# Patient Record
Sex: Female | Born: 1950
Health system: Southern US, Community
[De-identification: ages and names within clinical notes are randomized; demographics above are authoritative.]

## PROBLEM LIST (undated history)

## (undated) DIAGNOSIS — I509 Heart failure, unspecified: Secondary | ICD-10-CM

## (undated) DIAGNOSIS — Z973 Presence of spectacles and contact lenses: Secondary | ICD-10-CM

## (undated) DIAGNOSIS — J45909 Unspecified asthma, uncomplicated: Secondary | ICD-10-CM

## (undated) DIAGNOSIS — E785 Hyperlipidemia, unspecified: Secondary | ICD-10-CM

## (undated) DIAGNOSIS — D649 Anemia, unspecified: Secondary | ICD-10-CM

## (undated) DIAGNOSIS — IMO0002 Reserved for concepts with insufficient information to code with codable children: Secondary | ICD-10-CM

## (undated) DIAGNOSIS — G5600 Carpal tunnel syndrome, unspecified upper limb: Secondary | ICD-10-CM

## (undated) DIAGNOSIS — L03116 Cellulitis of left lower limb: Secondary | ICD-10-CM

## (undated) DIAGNOSIS — L97909 Non-pressure chronic ulcer of unspecified part of unspecified lower leg with unspecified severity: Secondary | ICD-10-CM

## (undated) DIAGNOSIS — Z87442 Personal history of urinary calculi: Secondary | ICD-10-CM

## (undated) DIAGNOSIS — L509 Urticaria, unspecified: Secondary | ICD-10-CM

## (undated) DIAGNOSIS — G629 Polyneuropathy, unspecified: Secondary | ICD-10-CM

## (undated) DIAGNOSIS — E538 Deficiency of other specified B group vitamins: Secondary | ICD-10-CM

## (undated) DIAGNOSIS — G609 Hereditary and idiopathic neuropathy, unspecified: Secondary | ICD-10-CM

## (undated) DIAGNOSIS — L309 Dermatitis, unspecified: Secondary | ICD-10-CM

## (undated) DIAGNOSIS — K635 Polyp of colon: Secondary | ICD-10-CM

## (undated) DIAGNOSIS — K219 Gastro-esophageal reflux disease without esophagitis: Secondary | ICD-10-CM

## (undated) DIAGNOSIS — G4730 Sleep apnea, unspecified: Secondary | ICD-10-CM

## (undated) DIAGNOSIS — E119 Type 2 diabetes mellitus without complications: Secondary | ICD-10-CM

## (undated) DIAGNOSIS — L0390 Cellulitis, unspecified: Secondary | ICD-10-CM

## (undated) DIAGNOSIS — I2510 Atherosclerotic heart disease of native coronary artery without angina pectoris: Secondary | ICD-10-CM

## (undated) DIAGNOSIS — N189 Chronic kidney disease, unspecified: Secondary | ICD-10-CM

## (undated) DIAGNOSIS — K279 Peptic ulcer, site unspecified, unspecified as acute or chronic, without hemorrhage or perforation: Secondary | ICD-10-CM

## (undated) DIAGNOSIS — G5710 Meralgia paresthetica, unspecified lower limb: Secondary | ICD-10-CM

## (undated) DIAGNOSIS — T4145XA Adverse effect of unspecified anesthetic, initial encounter: Secondary | ICD-10-CM

## (undated) DIAGNOSIS — I10 Essential (primary) hypertension: Secondary | ICD-10-CM

## (undated) DIAGNOSIS — L03115 Cellulitis of right lower limb: Secondary | ICD-10-CM

## (undated) DIAGNOSIS — M1710 Unilateral primary osteoarthritis, unspecified knee: Secondary | ICD-10-CM

## (undated) DIAGNOSIS — IMO0001 Reserved for inherently not codable concepts without codable children: Secondary | ICD-10-CM

## (undated) HISTORY — DX: Carpal tunnel syndrome, unspecified upper limb: G56.00

## (undated) HISTORY — PX: DILATION AND CURETTAGE OF UTERUS: SHX78

## (undated) HISTORY — DX: Polyp of colon: K63.5

## (undated) HISTORY — DX: Meralgia paresthetica, unspecified lower limb: G57.10

## (undated) HISTORY — DX: Hereditary and idiopathic neuropathy, unspecified: G60.9

## (undated) HISTORY — DX: Reserved for concepts with insufficient information to code with codable children: IMO0002

## (undated) HISTORY — PX: HEMORRHOID SURGERY: SHX153

## (undated) HISTORY — DX: Anemia, unspecified: D64.9

## (undated) HISTORY — DX: Hyperlipidemia, unspecified: E78.5

## (undated) HISTORY — DX: Atherosclerotic heart disease of native coronary artery without angina pectoris: I25.10

## (undated) HISTORY — DX: Urticaria, unspecified: L50.9

## (undated) HISTORY — DX: Unilateral primary osteoarthritis, unspecified knee: M17.10

## (undated) HISTORY — DX: Essential (primary) hypertension: I10

## (undated) HISTORY — DX: Unspecified asthma, uncomplicated: J45.909

## (undated) HISTORY — DX: Morbid (severe) obesity due to excess calories: E66.01

## (undated) HISTORY — PX: ABDOMINAL HYSTERECTOMY: SHX81

## (undated) HISTORY — DX: Type 2 diabetes mellitus without complications: E11.9

## (undated) HISTORY — PX: HEMORROIDECTOMY: SUR656

## (undated) HISTORY — DX: Non-pressure chronic ulcer of unspecified part of unspecified lower leg with unspecified severity: L97.909

## (undated) HISTORY — PX: OTHER SURGICAL HISTORY: SHX169

## (undated) HISTORY — DX: Peptic ulcer, site unspecified, unspecified as acute or chronic, without hemorrhage or perforation: K27.9

## (undated) HISTORY — PX: TONSILLECTOMY AND ADENOIDECTOMY: SUR1326

## (undated) HISTORY — DX: Cellulitis of right lower limb: L03.115

## (undated) HISTORY — DX: Cellulitis of left lower limb: L03.116

---

## 1997-02-02 DIAGNOSIS — Z87442 Personal history of urinary calculi: Secondary | ICD-10-CM

## 1997-02-02 DIAGNOSIS — T8859XA Other complications of anesthesia, initial encounter: Secondary | ICD-10-CM

## 1997-02-02 HISTORY — DX: Personal history of urinary calculi: Z87.442

## 1997-02-02 HISTORY — DX: Other complications of anesthesia, initial encounter: T88.59XA

## 1997-05-11 ENCOUNTER — Encounter: Admission: RE | Admit: 1997-05-11 | Discharge: 1997-08-09 | Payer: Self-pay | Admitting: Internal Medicine

## 1997-11-01 ENCOUNTER — Encounter: Admission: RE | Admit: 1997-11-01 | Discharge: 1998-01-30 | Payer: Self-pay | Admitting: Internal Medicine

## 1997-12-03 HISTORY — PX: OTHER SURGICAL HISTORY: SHX169

## 1998-01-02 ENCOUNTER — Encounter: Payer: Self-pay | Admitting: Urology

## 1998-01-03 ENCOUNTER — Observation Stay (HOSPITAL_COMMUNITY): Admission: RE | Admit: 1998-01-03 | Discharge: 1998-01-04 | Payer: Self-pay | Admitting: Urology

## 1998-01-03 ENCOUNTER — Encounter: Payer: Self-pay | Admitting: Urology

## 1998-08-20 ENCOUNTER — Inpatient Hospital Stay (HOSPITAL_COMMUNITY): Admission: EM | Admit: 1998-08-20 | Discharge: 1998-08-22 | Payer: Self-pay | Admitting: Specialist

## 1998-09-11 ENCOUNTER — Encounter: Admission: RE | Admit: 1998-09-11 | Discharge: 1998-12-10 | Payer: Self-pay | Admitting: Specialist

## 1998-12-24 ENCOUNTER — Encounter: Admission: RE | Admit: 1998-12-24 | Discharge: 1998-12-24 | Payer: Self-pay | Admitting: Obstetrics and Gynecology

## 1998-12-24 ENCOUNTER — Encounter: Payer: Self-pay | Admitting: Obstetrics and Gynecology

## 1999-01-01 ENCOUNTER — Encounter: Admission: RE | Admit: 1999-01-01 | Discharge: 1999-01-06 | Payer: Self-pay | Admitting: Internal Medicine

## 1999-02-10 ENCOUNTER — Other Ambulatory Visit: Admission: RE | Admit: 1999-02-10 | Discharge: 1999-02-10 | Payer: Self-pay | Admitting: Obstetrics and Gynecology

## 1999-04-03 ENCOUNTER — Encounter: Admission: RE | Admit: 1999-04-03 | Discharge: 1999-04-03 | Payer: Self-pay | Admitting: Urology

## 1999-04-03 ENCOUNTER — Encounter: Payer: Self-pay | Admitting: Urology

## 1999-05-21 ENCOUNTER — Encounter: Admission: RE | Admit: 1999-05-21 | Discharge: 1999-08-19 | Payer: Self-pay | Admitting: Internal Medicine

## 1999-11-07 ENCOUNTER — Encounter: Admission: RE | Admit: 1999-11-07 | Discharge: 2000-02-05 | Payer: Self-pay | Admitting: Internal Medicine

## 1999-12-29 ENCOUNTER — Encounter: Admission: RE | Admit: 1999-12-29 | Discharge: 1999-12-29 | Payer: Self-pay | Admitting: Internal Medicine

## 1999-12-29 ENCOUNTER — Encounter: Payer: Self-pay | Admitting: Internal Medicine

## 2000-02-03 HISTORY — PX: CARDIAC CATHETERIZATION: SHX172

## 2000-02-16 ENCOUNTER — Other Ambulatory Visit: Admission: RE | Admit: 2000-02-16 | Discharge: 2000-02-16 | Payer: Self-pay | Admitting: Obstetrics and Gynecology

## 2000-03-23 ENCOUNTER — Ambulatory Visit (HOSPITAL_COMMUNITY): Admission: RE | Admit: 2000-03-23 | Discharge: 2000-03-23 | Payer: Self-pay | Admitting: Gastroenterology

## 2000-03-26 ENCOUNTER — Encounter: Admission: RE | Admit: 2000-03-26 | Discharge: 2000-06-24 | Payer: Self-pay | Admitting: Internal Medicine

## 2000-04-20 ENCOUNTER — Encounter: Admission: RE | Admit: 2000-04-20 | Discharge: 2000-04-20 | Payer: Self-pay | Admitting: Urology

## 2000-04-20 ENCOUNTER — Encounter: Payer: Self-pay | Admitting: Urology

## 2000-07-29 ENCOUNTER — Inpatient Hospital Stay (HOSPITAL_COMMUNITY): Admission: RE | Admit: 2000-07-29 | Discharge: 2000-07-31 | Payer: Self-pay | Admitting: Cardiology

## 2000-07-29 ENCOUNTER — Encounter: Payer: Self-pay | Admitting: Cardiology

## 2000-07-30 ENCOUNTER — Encounter: Payer: Self-pay | Admitting: Cardiology

## 2000-08-24 ENCOUNTER — Encounter: Admission: RE | Admit: 2000-08-24 | Discharge: 2000-11-22 | Payer: Self-pay | Admitting: Internal Medicine

## 2000-09-13 ENCOUNTER — Emergency Department (HOSPITAL_COMMUNITY): Admission: EM | Admit: 2000-09-13 | Discharge: 2000-09-13 | Payer: Self-pay | Admitting: Emergency Medicine

## 2000-12-31 ENCOUNTER — Encounter: Payer: Self-pay | Admitting: Obstetrics and Gynecology

## 2000-12-31 ENCOUNTER — Encounter: Admission: RE | Admit: 2000-12-31 | Discharge: 2000-12-31 | Payer: Self-pay | Admitting: Obstetrics and Gynecology

## 2001-08-31 ENCOUNTER — Encounter: Admission: RE | Admit: 2001-08-31 | Discharge: 2001-11-29 | Payer: Self-pay | Admitting: Internal Medicine

## 2002-01-05 ENCOUNTER — Encounter: Admission: RE | Admit: 2002-01-05 | Discharge: 2002-01-05 | Payer: Self-pay | Admitting: Obstetrics & Gynecology

## 2002-01-05 ENCOUNTER — Encounter: Payer: Self-pay | Admitting: Obstetrics & Gynecology

## 2002-01-31 ENCOUNTER — Encounter: Admission: RE | Admit: 2002-01-31 | Discharge: 2002-01-31 | Payer: Self-pay | Admitting: Internal Medicine

## 2002-01-31 ENCOUNTER — Encounter: Payer: Self-pay | Admitting: Internal Medicine

## 2002-10-25 ENCOUNTER — Encounter: Admission: RE | Admit: 2002-10-25 | Discharge: 2003-01-23 | Payer: Self-pay | Admitting: Internal Medicine

## 2003-01-09 ENCOUNTER — Encounter: Admission: RE | Admit: 2003-01-09 | Discharge: 2003-01-09 | Payer: Self-pay | Admitting: Internal Medicine

## 2003-02-19 ENCOUNTER — Encounter: Admission: RE | Admit: 2003-02-19 | Discharge: 2003-05-20 | Payer: Self-pay | Admitting: Internal Medicine

## 2003-08-15 ENCOUNTER — Encounter: Admission: RE | Admit: 2003-08-15 | Discharge: 2003-11-13 | Payer: Self-pay | Admitting: Internal Medicine

## 2003-11-14 ENCOUNTER — Encounter: Admission: RE | Admit: 2003-11-14 | Discharge: 2003-11-14 | Payer: Self-pay | Admitting: Internal Medicine

## 2003-12-04 ENCOUNTER — Ambulatory Visit: Payer: Self-pay | Admitting: Internal Medicine

## 2004-01-10 ENCOUNTER — Ambulatory Visit: Payer: Self-pay | Admitting: Internal Medicine

## 2004-01-11 ENCOUNTER — Ambulatory Visit (HOSPITAL_COMMUNITY): Admission: RE | Admit: 2004-01-11 | Discharge: 2004-01-11 | Payer: Self-pay | Admitting: Obstetrics & Gynecology

## 2004-01-16 ENCOUNTER — Ambulatory Visit: Payer: Self-pay | Admitting: Internal Medicine

## 2004-01-31 ENCOUNTER — Ambulatory Visit: Payer: Self-pay | Admitting: Internal Medicine

## 2004-02-12 ENCOUNTER — Encounter: Admission: RE | Admit: 2004-02-12 | Discharge: 2004-05-12 | Payer: Self-pay | Admitting: Internal Medicine

## 2004-04-16 ENCOUNTER — Ambulatory Visit: Payer: Self-pay | Admitting: Internal Medicine

## 2004-04-28 ENCOUNTER — Ambulatory Visit: Payer: Self-pay | Admitting: Internal Medicine

## 2004-05-01 ENCOUNTER — Ambulatory Visit: Payer: Self-pay | Admitting: Internal Medicine

## 2004-06-17 ENCOUNTER — Ambulatory Visit: Payer: Self-pay | Admitting: Cardiology

## 2004-07-29 ENCOUNTER — Encounter: Admission: RE | Admit: 2004-07-29 | Discharge: 2004-10-27 | Payer: Self-pay | Admitting: Internal Medicine

## 2004-09-04 ENCOUNTER — Ambulatory Visit: Payer: Self-pay | Admitting: Internal Medicine

## 2004-09-11 ENCOUNTER — Ambulatory Visit: Payer: Self-pay | Admitting: Internal Medicine

## 2004-10-07 ENCOUNTER — Ambulatory Visit: Payer: Self-pay | Admitting: Internal Medicine

## 2004-10-28 ENCOUNTER — Encounter: Admission: RE | Admit: 2004-10-28 | Discharge: 2005-01-26 | Payer: Self-pay | Admitting: Internal Medicine

## 2004-11-13 ENCOUNTER — Ambulatory Visit: Payer: Self-pay | Admitting: Internal Medicine

## 2004-12-08 ENCOUNTER — Ambulatory Visit: Payer: Self-pay | Admitting: Internal Medicine

## 2004-12-15 ENCOUNTER — Ambulatory Visit: Payer: Self-pay | Admitting: Internal Medicine

## 2005-01-22 ENCOUNTER — Ambulatory Visit (HOSPITAL_COMMUNITY): Admission: RE | Admit: 2005-01-22 | Discharge: 2005-01-22 | Payer: Self-pay | Admitting: Obstetrics & Gynecology

## 2005-03-24 ENCOUNTER — Ambulatory Visit: Payer: Self-pay | Admitting: Internal Medicine

## 2005-04-02 ENCOUNTER — Ambulatory Visit: Payer: Self-pay | Admitting: Internal Medicine

## 2005-04-16 ENCOUNTER — Encounter: Admission: RE | Admit: 2005-04-16 | Discharge: 2005-07-15 | Payer: Self-pay | Admitting: Internal Medicine

## 2005-05-14 ENCOUNTER — Ambulatory Visit: Payer: Self-pay | Admitting: Internal Medicine

## 2005-06-23 ENCOUNTER — Ambulatory Visit (HOSPITAL_COMMUNITY): Admission: RE | Admit: 2005-06-23 | Discharge: 2005-06-23 | Payer: Self-pay | Admitting: Gastroenterology

## 2005-06-23 ENCOUNTER — Encounter (INDEPENDENT_AMBULATORY_CARE_PROVIDER_SITE_OTHER): Payer: Self-pay | Admitting: Specialist

## 2005-07-15 ENCOUNTER — Ambulatory Visit: Payer: Self-pay | Admitting: Internal Medicine

## 2005-07-16 ENCOUNTER — Encounter: Admission: RE | Admit: 2005-07-16 | Discharge: 2005-10-14 | Payer: Self-pay | Admitting: Internal Medicine

## 2005-12-29 ENCOUNTER — Ambulatory Visit: Payer: Self-pay | Admitting: Internal Medicine

## 2006-01-29 ENCOUNTER — Ambulatory Visit (HOSPITAL_COMMUNITY): Admission: RE | Admit: 2006-01-29 | Discharge: 2006-01-29 | Payer: Self-pay | Admitting: Urology

## 2006-02-17 ENCOUNTER — Ambulatory Visit: Payer: Self-pay | Admitting: Internal Medicine

## 2006-02-17 LAB — CONVERTED CEMR LAB
BUN: 27 mg/dL — ABNORMAL HIGH (ref 6–23)
Creatinine, Ser: 1.5 mg/dL — ABNORMAL HIGH (ref 0.4–1.2)
Hgb A1c MFr Bld: 6.4 % — ABNORMAL HIGH (ref 4.6–6.0)
Potassium: 3.8 meq/L (ref 3.5–5.1)

## 2006-03-16 ENCOUNTER — Ambulatory Visit: Payer: Self-pay | Admitting: Internal Medicine

## 2006-03-16 LAB — CONVERTED CEMR LAB
ALT: 27 units/L (ref 0–40)
AST: 24 units/L (ref 0–37)
Basophils Absolute: 0.1 10*3/uL (ref 0.0–0.1)
Basophils Relative: 1.7 % — ABNORMAL HIGH (ref 0.0–1.0)
CK-MB: 1 ng/mL (ref 0.3–4.0)
Cholesterol: 183 mg/dL (ref 0–200)
Eosinophils Absolute: 0.1 10*3/uL (ref 0.0–0.6)
Eosinophils Relative: 1.4 % (ref 0.0–5.0)
Free T4: 0.8 ng/dL (ref 0.6–1.6)
HCT: 34.9 % — ABNORMAL LOW (ref 36.0–46.0)
HDL: 36.9 mg/dL — ABNORMAL LOW (ref >39.0)
Hemoglobin: 12 g/dL (ref 12.0–15.0)
LDL Cholesterol: 136 mg/dL — ABNORMAL HIGH (ref 0–99)
Lymphocytes Relative: 23.5 % (ref 12.0–46.0)
MCHC: 34.4 g/dL (ref 30.0–36.0)
MCV: 85.6 fL (ref 78.0–100.0)
Monocytes Absolute: 0.4 10*3/uL (ref 0.2–0.7)
Monocytes Relative: 6.5 % (ref 3.0–11.0)
Neutro Abs: 3.8 10*3/uL (ref 1.4–7.7)
Neutrophils Relative %: 66.9 % (ref 43.0–77.0)
Platelets: 202 10*3/uL (ref 150–400)
RBC: 4.08 M/uL (ref 3.87–5.11)
RDW: 14.2 % (ref 11.5–14.6)
TSH: 3.48 microintl units/mL (ref 0.35–5.50)
Total CHOL/HDL Ratio: 5
Total CK: 73 units/L (ref 7–177)
Triglycerides: 51 mg/dL (ref 0–149)
Troponin I: 0.06 ng/mL — ABNORMAL HIGH (ref <0.06)
VLDL: 10 mg/dL (ref 0–40)
WBC: 5.8 10*3/uL (ref 4.5–10.5)

## 2006-03-23 ENCOUNTER — Ambulatory Visit: Payer: Self-pay | Admitting: Cardiology

## 2006-03-24 ENCOUNTER — Encounter (HOSPITAL_COMMUNITY): Admission: RE | Admit: 2006-03-24 | Discharge: 2006-03-25 | Payer: Self-pay | Admitting: Internal Medicine

## 2006-03-26 ENCOUNTER — Ambulatory Visit: Payer: Self-pay | Admitting: Internal Medicine

## 2006-03-26 ENCOUNTER — Ambulatory Visit (HOSPITAL_COMMUNITY): Admission: RE | Admit: 2006-03-26 | Discharge: 2006-03-26 | Payer: Self-pay | Admitting: Internal Medicine

## 2006-03-26 LAB — CONVERTED CEMR LAB
BUN: 17 mg/dL (ref 6–23)
Basophils Absolute: 0.1 10*3/uL (ref 0.0–0.1)
Basophils Relative: 1.6 % — ABNORMAL HIGH (ref 0.0–1.0)
CO2: 29 meq/L (ref 19–32)
Calcium: 9.2 mg/dL (ref 8.4–10.5)
Chloride: 105 meq/L (ref 96–112)
Creatinine, Ser: 1.1 mg/dL (ref 0.4–1.2)
Eosinophils Absolute: 0.1 10*3/uL (ref 0.0–0.6)
Eosinophils Relative: 1.5 % (ref 0.0–5.0)
GFR calc Af Amer: 66 mL/min
GFR calc non Af Amer: 55 mL/min
Glucose, Bld: 100 mg/dL — ABNORMAL HIGH (ref 70–99)
HCT: 35.7 % — ABNORMAL LOW (ref 36.0–46.0)
Hemoglobin: 12 g/dL (ref 12.0–15.0)
INR: 1 (ref 0.9–2.0)
Lymphocytes Relative: 32.6 % (ref 12.0–46.0)
MCHC: 33.6 g/dL (ref 30.0–36.0)
MCV: 86.8 fL (ref 78.0–100.0)
Monocytes Absolute: 0.4 10*3/uL (ref 0.2–0.7)
Monocytes Relative: 5.9 % (ref 3.0–11.0)
Neutro Abs: 4.5 10*3/uL (ref 1.4–7.7)
Neutrophils Relative %: 58.4 % (ref 43.0–77.0)
Platelets: 219 10*3/uL (ref 150–400)
Potassium: 3.7 meq/L (ref 3.5–5.1)
Prothrombin Time: 12.3 s (ref 10.0–14.0)
RBC: 4.12 M/uL (ref 3.87–5.11)
RDW: 14.1 % (ref 11.5–14.6)
Sodium: 142 meq/L (ref 135–145)
WBC: 7.6 10*3/uL (ref 4.5–10.5)
aPTT: 28.8 s (ref 26.5–36.5)

## 2006-03-30 ENCOUNTER — Inpatient Hospital Stay (HOSPITAL_BASED_OUTPATIENT_CLINIC_OR_DEPARTMENT_OTHER): Admission: RE | Admit: 2006-03-30 | Discharge: 2006-03-30 | Payer: Self-pay | Admitting: Internal Medicine

## 2006-03-30 ENCOUNTER — Ambulatory Visit: Payer: Self-pay | Admitting: Internal Medicine

## 2006-03-31 ENCOUNTER — Ambulatory Visit: Payer: Self-pay

## 2006-04-13 ENCOUNTER — Ambulatory Visit: Payer: Self-pay | Admitting: Internal Medicine

## 2006-04-30 DIAGNOSIS — E876 Hypokalemia: Secondary | ICD-10-CM

## 2006-04-30 DIAGNOSIS — Z8711 Personal history of peptic ulcer disease: Secondary | ICD-10-CM

## 2006-04-30 DIAGNOSIS — Z6841 Body Mass Index (BMI) 40.0 and over, adult: Secondary | ICD-10-CM

## 2006-05-03 ENCOUNTER — Ambulatory Visit: Payer: Self-pay | Admitting: Pulmonary Disease

## 2006-06-22 ENCOUNTER — Ambulatory Visit: Payer: Self-pay | Admitting: Internal Medicine

## 2006-06-22 LAB — CONVERTED CEMR LAB
BUN: 18 mg/dL (ref 6–23)
Creatinine, Ser: 1.1 mg/dL (ref 0.4–1.2)

## 2006-06-29 ENCOUNTER — Encounter: Payer: Self-pay | Admitting: Internal Medicine

## 2006-06-29 ENCOUNTER — Ambulatory Visit: Payer: Self-pay | Admitting: Internal Medicine

## 2006-07-04 ENCOUNTER — Encounter (INDEPENDENT_AMBULATORY_CARE_PROVIDER_SITE_OTHER): Payer: Self-pay | Admitting: Neurology

## 2006-07-04 ENCOUNTER — Inpatient Hospital Stay (HOSPITAL_COMMUNITY): Admission: EM | Admit: 2006-07-04 | Discharge: 2006-07-06 | Payer: Self-pay | Admitting: Emergency Medicine

## 2006-07-06 ENCOUNTER — Encounter (INDEPENDENT_AMBULATORY_CARE_PROVIDER_SITE_OTHER): Payer: Self-pay | Admitting: Neurology

## 2006-07-07 ENCOUNTER — Telehealth (INDEPENDENT_AMBULATORY_CARE_PROVIDER_SITE_OTHER): Payer: Self-pay | Admitting: *Deleted

## 2006-07-08 ENCOUNTER — Encounter: Payer: Self-pay | Admitting: Internal Medicine

## 2006-07-12 ENCOUNTER — Telehealth (INDEPENDENT_AMBULATORY_CARE_PROVIDER_SITE_OTHER): Payer: Self-pay | Admitting: *Deleted

## 2006-07-14 ENCOUNTER — Encounter: Payer: Self-pay | Admitting: Internal Medicine

## 2006-07-15 ENCOUNTER — Telehealth: Payer: Self-pay | Admitting: Internal Medicine

## 2006-07-20 ENCOUNTER — Ambulatory Visit: Payer: Self-pay | Admitting: Internal Medicine

## 2006-07-21 ENCOUNTER — Encounter: Payer: Self-pay | Admitting: Internal Medicine

## 2006-07-21 LAB — CONVERTED CEMR LAB
BUN: 25 mg/dL — ABNORMAL HIGH (ref 6–23)
CO2: 33 meq/L — ABNORMAL HIGH (ref 19–32)
Calcium: 9.3 mg/dL (ref 8.4–10.5)
Chloride: 99 meq/L (ref 96–112)
Creatinine, Ser: 1.1 mg/dL (ref 0.4–1.2)
GFR calc Af Amer: 66 mL/min
GFR calc non Af Amer: 55 mL/min
Glucose, Bld: 103 mg/dL — ABNORMAL HIGH (ref 70–99)
Potassium: 3.1 meq/L — ABNORMAL LOW (ref 3.5–5.1)
Sodium: 141 meq/L (ref 135–145)

## 2006-08-25 ENCOUNTER — Encounter: Admission: RE | Admit: 2006-08-25 | Discharge: 2006-09-22 | Payer: Self-pay | Admitting: Neurology

## 2006-09-20 ENCOUNTER — Encounter: Payer: Self-pay | Admitting: Internal Medicine

## 2006-09-23 ENCOUNTER — Encounter: Admission: RE | Admit: 2006-09-23 | Discharge: 2006-10-29 | Payer: Self-pay | Admitting: Neurology

## 2006-09-29 ENCOUNTER — Encounter: Admission: RE | Admit: 2006-09-29 | Discharge: 2006-10-28 | Payer: Self-pay | Admitting: Neurology

## 2006-09-30 ENCOUNTER — Encounter (INDEPENDENT_AMBULATORY_CARE_PROVIDER_SITE_OTHER): Payer: Self-pay | Admitting: *Deleted

## 2006-10-01 ENCOUNTER — Ambulatory Visit: Payer: Self-pay | Admitting: Internal Medicine

## 2006-10-02 LAB — CONVERTED CEMR LAB: Potassium: 3.7 meq/L

## 2006-10-05 ENCOUNTER — Encounter (INDEPENDENT_AMBULATORY_CARE_PROVIDER_SITE_OTHER): Payer: Self-pay | Admitting: *Deleted

## 2006-10-12 ENCOUNTER — Ambulatory Visit: Payer: Self-pay | Admitting: Internal Medicine

## 2006-10-12 ENCOUNTER — Telehealth (INDEPENDENT_AMBULATORY_CARE_PROVIDER_SITE_OTHER): Payer: Self-pay | Admitting: *Deleted

## 2006-10-12 DIAGNOSIS — L259 Unspecified contact dermatitis, unspecified cause: Secondary | ICD-10-CM

## 2006-10-25 ENCOUNTER — Encounter: Payer: Self-pay | Admitting: Internal Medicine

## 2006-10-27 ENCOUNTER — Encounter: Payer: Self-pay | Admitting: Internal Medicine

## 2006-11-16 ENCOUNTER — Ambulatory Visit: Payer: Self-pay | Admitting: Internal Medicine

## 2006-12-03 ENCOUNTER — Encounter: Payer: Self-pay | Admitting: Internal Medicine

## 2006-12-20 ENCOUNTER — Encounter: Payer: Self-pay | Admitting: Internal Medicine

## 2006-12-20 ENCOUNTER — Ambulatory Visit: Payer: Self-pay | Admitting: Internal Medicine

## 2006-12-23 ENCOUNTER — Ambulatory Visit: Payer: Self-pay | Admitting: Internal Medicine

## 2007-01-18 ENCOUNTER — Ambulatory Visit: Payer: Self-pay | Admitting: Internal Medicine

## 2007-01-18 DIAGNOSIS — I10 Essential (primary) hypertension: Secondary | ICD-10-CM

## 2007-01-24 ENCOUNTER — Telehealth (INDEPENDENT_AMBULATORY_CARE_PROVIDER_SITE_OTHER): Payer: Self-pay | Admitting: *Deleted

## 2007-02-07 ENCOUNTER — Ambulatory Visit (HOSPITAL_COMMUNITY): Admission: RE | Admit: 2007-02-07 | Discharge: 2007-02-07 | Payer: Self-pay | Admitting: Internal Medicine

## 2007-02-11 ENCOUNTER — Encounter: Payer: Self-pay | Admitting: Internal Medicine

## 2007-03-16 ENCOUNTER — Encounter: Payer: Self-pay | Admitting: Internal Medicine

## 2007-05-02 ENCOUNTER — Ambulatory Visit: Payer: Self-pay | Admitting: Internal Medicine

## 2007-05-02 LAB — CONVERTED CEMR LAB
Cholesterol: 228 mg/dL (ref 0–200)
Creatinine,U: 84.9 mg/dL
Direct LDL: 169.4 mg/dL
HDL: 47.4 mg/dL (ref >39.0)
Hgb A1c MFr Bld: 6 % (ref 4.6–6.0)
Microalb Creat Ratio: 22.4 mg/g (ref 0.0–30.0)
Microalb, Ur: 1.9 mg/dL (ref 0.0–1.9)
Total CHOL/HDL Ratio: 4.8
Triglycerides: 55 mg/dL (ref 0–149)
VLDL: 11 mg/dL (ref 0–40)

## 2007-05-09 ENCOUNTER — Telehealth (INDEPENDENT_AMBULATORY_CARE_PROVIDER_SITE_OTHER): Payer: Self-pay | Admitting: *Deleted

## 2007-05-10 ENCOUNTER — Ambulatory Visit: Payer: Self-pay | Admitting: Internal Medicine

## 2007-05-10 DIAGNOSIS — E8881 Metabolic syndrome: Secondary | ICD-10-CM

## 2007-05-10 DIAGNOSIS — E785 Hyperlipidemia, unspecified: Secondary | ICD-10-CM

## 2007-05-10 LAB — CONVERTED CEMR LAB
Cholesterol, target level: 200 mg/dL
HDL goal, serum: 40 mg/dL
LDL Goal: 130 mg/dL

## 2007-06-20 ENCOUNTER — Telehealth (INDEPENDENT_AMBULATORY_CARE_PROVIDER_SITE_OTHER): Payer: Self-pay | Admitting: *Deleted

## 2007-07-27 ENCOUNTER — Encounter: Payer: Self-pay | Admitting: Internal Medicine

## 2007-08-09 ENCOUNTER — Ambulatory Visit: Payer: Self-pay | Admitting: Internal Medicine

## 2007-08-09 LAB — CONVERTED CEMR LAB
ALT: 21 units/L (ref 0–35)
AST: 19 units/L (ref 0–37)
Albumin: 3.4 g/dL — ABNORMAL LOW (ref 3.5–5.2)
Alkaline Phosphatase: 65 units/L (ref 39–117)
BUN: 15 mg/dL (ref 6–23)
Bilirubin, Direct: 0.1 mg/dL (ref 0.0–0.3)
Cholesterol: 118 mg/dL (ref 0–200)
Creatinine, Ser: 1 mg/dL (ref 0.4–1.2)
HDL: 41.6 mg/dL (ref >39.0)
LDL Cholesterol: 68 mg/dL (ref 0–99)
Potassium: 4 meq/L (ref 3.5–5.1)
Total Bilirubin: 0.5 mg/dL (ref 0.3–1.2)
Total CHOL/HDL Ratio: 2.8
Total Protein: 6.3 g/dL (ref 6.0–8.3)
Triglycerides: 41 mg/dL (ref 0–149)
VLDL: 8 mg/dL (ref 0–40)

## 2007-08-16 ENCOUNTER — Ambulatory Visit: Payer: Self-pay | Admitting: Internal Medicine

## 2007-08-16 LAB — CONVERTED CEMR LAB: LDL Goal: 70 mg/dL

## 2007-10-13 ENCOUNTER — Ambulatory Visit: Payer: Self-pay | Admitting: Internal Medicine

## 2007-11-22 ENCOUNTER — Ambulatory Visit: Payer: Self-pay | Admitting: Internal Medicine

## 2008-01-10 ENCOUNTER — Ambulatory Visit (HOSPITAL_COMMUNITY): Admission: RE | Admit: 2008-01-10 | Discharge: 2008-01-10 | Payer: Self-pay | Admitting: Orthopedic Surgery

## 2008-01-10 ENCOUNTER — Encounter (INDEPENDENT_AMBULATORY_CARE_PROVIDER_SITE_OTHER): Payer: Self-pay | Admitting: Orthopedic Surgery

## 2008-01-10 ENCOUNTER — Ambulatory Visit: Payer: Self-pay | Admitting: Vascular Surgery

## 2008-02-20 ENCOUNTER — Telehealth: Payer: Self-pay | Admitting: Internal Medicine

## 2008-02-23 ENCOUNTER — Encounter: Payer: Self-pay | Admitting: Internal Medicine

## 2008-02-23 ENCOUNTER — Telehealth (INDEPENDENT_AMBULATORY_CARE_PROVIDER_SITE_OTHER): Payer: Self-pay | Admitting: *Deleted

## 2008-02-27 ENCOUNTER — Ambulatory Visit (HOSPITAL_COMMUNITY): Admission: RE | Admit: 2008-02-27 | Discharge: 2008-02-27 | Payer: Self-pay | Admitting: Obstetrics & Gynecology

## 2008-03-19 ENCOUNTER — Encounter: Payer: Self-pay | Admitting: Internal Medicine

## 2008-06-11 ENCOUNTER — Telehealth (INDEPENDENT_AMBULATORY_CARE_PROVIDER_SITE_OTHER): Payer: Self-pay | Admitting: *Deleted

## 2008-06-14 ENCOUNTER — Ambulatory Visit: Payer: Self-pay | Admitting: Internal Medicine

## 2008-06-14 DIAGNOSIS — M1710 Unilateral primary osteoarthritis, unspecified knee: Secondary | ICD-10-CM

## 2008-06-14 DIAGNOSIS — M170 Bilateral primary osteoarthritis of knee: Secondary | ICD-10-CM | POA: Insufficient documentation

## 2008-06-17 LAB — CONVERTED CEMR LAB
BUN: 20 mg/dL (ref 6–23)
Creatinine, Ser: 1 mg/dL (ref 0.4–1.2)
Potassium: 3.8 meq/L (ref 3.5–5.1)

## 2008-06-18 ENCOUNTER — Encounter (INDEPENDENT_AMBULATORY_CARE_PROVIDER_SITE_OTHER): Payer: Self-pay | Admitting: *Deleted

## 2008-06-29 ENCOUNTER — Ambulatory Visit: Payer: Self-pay | Admitting: Internal Medicine

## 2008-09-19 ENCOUNTER — Ambulatory Visit: Payer: Self-pay | Admitting: Internal Medicine

## 2008-09-19 LAB — CONVERTED CEMR LAB
ALT: 14 units/L (ref 0–35)
AST: 16 units/L (ref 0–37)
Albumin: 3.7 g/dL (ref 3.5–5.2)
Alkaline Phosphatase: 59 units/L (ref 39–117)
BUN: 15 mg/dL (ref 6–23)
Basophils Absolute: 0.1 10*3/uL (ref 0.0–0.1)
Basophils Relative: 1.3 % (ref 0.0–3.0)
Bilirubin, Direct: 0 mg/dL (ref 0.0–0.3)
CO2: 33 meq/L — ABNORMAL HIGH (ref 19–32)
Calcium: 8.6 mg/dL (ref 8.4–10.5)
Chloride: 108 meq/L (ref 96–112)
Cholesterol: 140 mg/dL (ref 0–200)
Creatinine, Ser: 0.9 mg/dL (ref 0.4–1.2)
Creatinine,U: 234.8 mg/dL
Eosinophils Absolute: 0 10*3/uL (ref 0.0–0.7)
Eosinophils Relative: 1 % (ref 0.0–5.0)
GFR calc non Af Amer: 82.62 mL/min (ref >60)
Glucose, Bld: 98 mg/dL (ref 70–99)
HCT: 36.4 % (ref 36.0–46.0)
HDL: 40.7 mg/dL (ref >39.00)
Hemoglobin: 12 g/dL (ref 12.0–15.0)
Hgb A1c MFr Bld: 5.5 % (ref 4.6–6.5)
LDL Cholesterol: 88 mg/dL (ref 0–99)
Lymphocytes Relative: 27.8 % (ref 12.0–46.0)
Lymphs Abs: 1.4 10*3/uL (ref 0.7–4.0)
MCHC: 33 g/dL (ref 30.0–36.0)
MCV: 88.5 fL (ref 78.0–100.0)
Microalb Creat Ratio: 13.6 mg/g (ref 0.0–30.0)
Microalb, Ur: 3.2 mg/dL — ABNORMAL HIGH (ref 0.0–1.9)
Monocytes Absolute: 0.3 10*3/uL (ref 0.1–1.0)
Monocytes Relative: 5.2 % (ref 3.0–12.0)
Neutro Abs: 3.2 10*3/uL (ref 1.4–7.7)
Neutrophils Relative %: 64.7 % (ref 43.0–77.0)
Platelets: 192 10*3/uL (ref 150.0–400.0)
Potassium: 3.4 meq/L — ABNORMAL LOW (ref 3.5–5.1)
RBC: 4.11 M/uL (ref 3.87–5.11)
RDW: 13.6 % (ref 11.5–14.6)
Sodium: 145 meq/L (ref 135–145)
TSH: 3.22 microintl units/mL (ref 0.35–5.50)
Total Bilirubin: 0.6 mg/dL (ref 0.3–1.2)
Total CHOL/HDL Ratio: 3
Total Protein: 6.7 g/dL (ref 6.0–8.3)
Triglycerides: 59 mg/dL (ref 0.0–149.0)
VLDL: 11.8 mg/dL (ref 0.0–40.0)
WBC: 5 10*3/uL (ref 4.5–10.5)

## 2008-09-27 ENCOUNTER — Ambulatory Visit: Payer: Self-pay | Admitting: Internal Medicine

## 2008-10-16 ENCOUNTER — Telehealth (INDEPENDENT_AMBULATORY_CARE_PROVIDER_SITE_OTHER): Payer: Self-pay | Admitting: *Deleted

## 2008-10-22 ENCOUNTER — Encounter: Payer: Self-pay | Admitting: Internal Medicine

## 2008-10-31 ENCOUNTER — Ambulatory Visit: Payer: Self-pay | Admitting: Internal Medicine

## 2008-11-20 DIAGNOSIS — I2510 Atherosclerotic heart disease of native coronary artery without angina pectoris: Secondary | ICD-10-CM | POA: Insufficient documentation

## 2008-12-18 ENCOUNTER — Ambulatory Visit: Payer: Self-pay | Admitting: Internal Medicine

## 2008-12-22 LAB — CONVERTED CEMR LAB
BUN: 17 mg/dL (ref 6–23)
Creatinine, Ser: 1.1 mg/dL (ref 0.4–1.2)
Hgb A1c MFr Bld: 5.6 % (ref 4.6–6.5)
Potassium: 3.4 meq/L — ABNORMAL LOW (ref 3.5–5.1)

## 2008-12-24 ENCOUNTER — Encounter (INDEPENDENT_AMBULATORY_CARE_PROVIDER_SITE_OTHER): Payer: Self-pay | Admitting: *Deleted

## 2009-01-01 ENCOUNTER — Ambulatory Visit: Payer: Self-pay | Admitting: Internal Medicine

## 2009-01-03 ENCOUNTER — Ambulatory Visit: Payer: Self-pay | Admitting: Internal Medicine

## 2009-01-29 ENCOUNTER — Telehealth (INDEPENDENT_AMBULATORY_CARE_PROVIDER_SITE_OTHER): Payer: Self-pay | Admitting: *Deleted

## 2009-01-30 ENCOUNTER — Ambulatory Visit: Payer: Self-pay | Admitting: Internal Medicine

## 2009-02-19 ENCOUNTER — Ambulatory Visit: Payer: Self-pay | Admitting: Internal Medicine

## 2009-02-21 ENCOUNTER — Telehealth (INDEPENDENT_AMBULATORY_CARE_PROVIDER_SITE_OTHER): Payer: Self-pay | Admitting: *Deleted

## 2009-02-27 ENCOUNTER — Ambulatory Visit (HOSPITAL_COMMUNITY): Admission: RE | Admit: 2009-02-27 | Discharge: 2009-02-27 | Payer: Self-pay | Admitting: Obstetrics & Gynecology

## 2009-03-06 ENCOUNTER — Ambulatory Visit: Payer: Self-pay | Admitting: Internal Medicine

## 2009-03-06 DIAGNOSIS — J45909 Unspecified asthma, uncomplicated: Secondary | ICD-10-CM | POA: Insufficient documentation

## 2009-03-20 ENCOUNTER — Encounter: Payer: Self-pay | Admitting: Internal Medicine

## 2009-03-25 ENCOUNTER — Ambulatory Visit: Payer: Self-pay | Admitting: Internal Medicine

## 2009-03-26 LAB — CONVERTED CEMR LAB
BUN: 15 mg/dL (ref 6–23)
Creatinine, Ser: 1.1 mg/dL (ref 0.4–1.2)
Creatinine,U: 502 mg/dL
Hgb A1c MFr Bld: 5.7 % (ref 4.6–6.5)
Microalb Creat Ratio: 3.4 mg/g (ref 0.0–30.0)
Microalb, Ur: 1.7 mg/dL (ref 0.0–1.9)
Potassium: 3.3 meq/L — ABNORMAL LOW (ref 3.5–5.1)

## 2009-04-01 ENCOUNTER — Ambulatory Visit: Payer: Self-pay | Admitting: Internal Medicine

## 2009-04-02 ENCOUNTER — Telehealth (INDEPENDENT_AMBULATORY_CARE_PROVIDER_SITE_OTHER): Payer: Self-pay | Admitting: *Deleted

## 2009-04-30 ENCOUNTER — Encounter: Payer: Self-pay | Admitting: Internal Medicine

## 2009-06-17 ENCOUNTER — Ambulatory Visit: Payer: Self-pay | Admitting: Internal Medicine

## 2009-06-18 LAB — CONVERTED CEMR LAB
BUN: 22 mg/dL (ref 6–23)
Cortisol, Plasma: 7.5 ug/dL
Creatinine, Ser: 1 mg/dL (ref 0.4–1.2)
Potassium: 3.8 meq/L (ref 3.5–5.1)

## 2009-06-24 ENCOUNTER — Ambulatory Visit: Payer: Self-pay | Admitting: Internal Medicine

## 2009-07-03 ENCOUNTER — Ambulatory Visit: Payer: Self-pay | Admitting: Internal Medicine

## 2009-07-03 DIAGNOSIS — J019 Acute sinusitis, unspecified: Secondary | ICD-10-CM | POA: Insufficient documentation

## 2009-08-15 ENCOUNTER — Encounter: Payer: Self-pay | Admitting: Internal Medicine

## 2009-09-16 ENCOUNTER — Telehealth (INDEPENDENT_AMBULATORY_CARE_PROVIDER_SITE_OTHER): Payer: Self-pay | Admitting: *Deleted

## 2009-09-18 ENCOUNTER — Ambulatory Visit: Payer: Self-pay | Admitting: Internal Medicine

## 2009-09-23 LAB — CONVERTED CEMR LAB
ALT: 14 units/L (ref 0–35)
AST: 14 units/L (ref 0–37)
Albumin: 3.6 g/dL (ref 3.5–5.2)
Alkaline Phosphatase: 62 units/L (ref 39–117)
Bilirubin, Direct: 0.2 mg/dL (ref 0.0–0.3)
Cholesterol: 148 mg/dL (ref 0–200)
HDL: 37.6 mg/dL — ABNORMAL LOW (ref >39.00)
LDL Cholesterol: 99 mg/dL (ref 0–99)
Total Bilirubin: 0.6 mg/dL (ref 0.3–1.2)
Total CHOL/HDL Ratio: 4
Total CK: 52 units/L (ref 7–177)
Total Protein: 6.5 g/dL (ref 6.0–8.3)
Triglycerides: 58 mg/dL (ref 0.0–149.0)
VLDL: 11.6 mg/dL (ref 0.0–40.0)

## 2009-09-24 ENCOUNTER — Ambulatory Visit: Payer: Self-pay | Admitting: Internal Medicine

## 2009-10-29 ENCOUNTER — Ambulatory Visit: Payer: Self-pay | Admitting: Internal Medicine

## 2010-01-08 ENCOUNTER — Ambulatory Visit: Payer: Self-pay | Admitting: Internal Medicine

## 2010-01-08 DIAGNOSIS — R3915 Urgency of urination: Secondary | ICD-10-CM | POA: Insufficient documentation

## 2010-01-08 DIAGNOSIS — J45901 Unspecified asthma with (acute) exacerbation: Secondary | ICD-10-CM | POA: Insufficient documentation

## 2010-01-08 DIAGNOSIS — R109 Unspecified abdominal pain: Secondary | ICD-10-CM

## 2010-01-09 ENCOUNTER — Telehealth: Payer: Self-pay | Admitting: Internal Medicine

## 2010-01-09 LAB — CONVERTED CEMR LAB
BUN: 17 mg/dL (ref 6–23)
Basophils Absolute: 0.1 10*3/uL (ref 0.0–0.1)
Basophils Relative: 0.8 % (ref 0.0–3.0)
Creatinine, Ser: 1 mg/dL (ref 0.4–1.2)
Eosinophils Absolute: 0 10*3/uL (ref 0.0–0.7)
Eosinophils Relative: 0.5 % (ref 0.0–5.0)
HCT: 36 % (ref 36.0–46.0)
Hemoglobin: 11.9 g/dL — ABNORMAL LOW (ref 12.0–15.0)
Hgb A1c MFr Bld: 6 % (ref 4.6–6.5)
Lymphocytes Relative: 16.4 % (ref 12.0–46.0)
Lymphs Abs: 1.3 10*3/uL (ref 0.7–4.0)
MCHC: 33.2 g/dL (ref 30.0–36.0)
MCV: 88.5 fL (ref 78.0–100.0)
Monocytes Absolute: 0.6 10*3/uL (ref 0.1–1.0)
Monocytes Relative: 7.3 % (ref 3.0–12.0)
Neutro Abs: 6 10*3/uL (ref 1.4–7.7)
Neutrophils Relative %: 75 % (ref 43.0–77.0)
Platelets: 191 10*3/uL (ref 150.0–400.0)
Potassium: 3.6 meq/L (ref 3.5–5.1)
RBC: 4.07 M/uL (ref 3.87–5.11)
RDW: 14.1 % (ref 11.5–14.6)
WBC: 8.1 10*3/uL (ref 4.5–10.5)

## 2010-01-11 ENCOUNTER — Encounter: Payer: Self-pay | Admitting: Internal Medicine

## 2010-01-16 ENCOUNTER — Encounter: Payer: Self-pay | Admitting: Internal Medicine

## 2010-01-16 ENCOUNTER — Ambulatory Visit: Payer: Self-pay | Admitting: Internal Medicine

## 2010-01-16 DIAGNOSIS — R5381 Other malaise: Secondary | ICD-10-CM | POA: Insufficient documentation

## 2010-01-16 DIAGNOSIS — R5383 Other fatigue: Secondary | ICD-10-CM

## 2010-01-23 ENCOUNTER — Telehealth: Payer: Self-pay | Admitting: Internal Medicine

## 2010-01-23 ENCOUNTER — Ambulatory Visit: Payer: Self-pay | Admitting: Internal Medicine

## 2010-01-23 LAB — CONVERTED CEMR LAB
BUN: 23 mg/dL (ref 6–23)
CO2: 32 meq/L (ref 19–32)
Calcium: 9 mg/dL (ref 8.4–10.5)
Chloride: 102 meq/L (ref 96–112)
GFR calc non Af Amer: 69.6 mL/min (ref >60.00)
Glucose, Bld: 118 mg/dL — ABNORMAL HIGH (ref 70–99)
Potassium: 4.1 meq/L (ref 3.5–5.1)
Sodium: 141 meq/L (ref 135–145)

## 2010-02-28 ENCOUNTER — Ambulatory Visit (HOSPITAL_COMMUNITY)
Admission: RE | Admit: 2010-02-28 | Discharge: 2010-02-28 | Payer: Self-pay | Source: Home / Self Care | Attending: Internal Medicine | Admitting: Internal Medicine

## 2010-03-04 NOTE — Progress Notes (Signed)
Summary: med update furosemide  Phone Note Call from Patient   Caller: Patient Summary of Call: Pt called back per OV to informed dr hopper of the name, and strength of fluid pill.  pt Med list updated....................Marland KitchenFelecia Deloach CMA  April 02, 2009 4:00 PM     New/Updated Medications: FUROSEMIDE 40 MG TABS (FUROSEMIDE) Take 1/2 to 1 tab by mouth once daily as needed for EDEMA

## 2010-03-04 NOTE — Progress Notes (Signed)
Summary: lab & f/u ov  ---- Converted from flag ---- ---- 09/16/2009 9:17 AM, Arbie [place] wrote: patient scheduled lab [id number] - [facility] [id number]  ---- 09/15/2009 9:55 AM, Unice Cobble MD wrote: please schedule fasting lipids, CPK, hepatic panel (272.4, 995.20) with F/U 5-7 days later ( last done 08/10) ------------------------------

## 2010-03-04 NOTE — Assessment & Plan Note (Signed)
Summary: cough no better//fd   Vital Signs:  Patient profile:   60 year old female Weight:      397.4 pounds O2 Sat:      96 % on Room air Temp:     99.0 degrees F oral Pulse rate:   76 / minute Resp:     16 per minute BP sitting:   140 / 86  (left arm) Cuff size:   large  Vitals Entered By: Georgette Dover (March 06, 2009 11:14 AM)  O2 Flow:  Room air CC: Cough,drainage,sore throat,sweats,chills and bodyaches Comments REVIEWED MED LIST, PATIENT AGREED DOSE AND INSTRUCTION CORRECT    Primary Care Provider:  Unice Cobble MD  CC:  Cough, drainage, sore throat, sweats, and chills and bodyaches.  History of Present Illness:  Cough      This is a 60 year old woman who presents with Cough since RTI in late 01/2009.  The patient reports productive cough with yellow green sputum, shortness of breath, wheezing, exertional dyspnea, fever, and malaise, but denies pleuritic chest pain and hemoptysis.  Associated symtpoms include sore throat, nasal congestion, and chronic rhinitis.  The patient denies the following symptoms: cold/URI symptoms, weight loss,  or acid reflux symptoms.Peripheral edema is stable.  The cough is worse with activity.  Ineffective prior treatments have included albuterol inhaler.  Risk factors include history of asthma and history of reflux.  Diagnostic testing to date has included CXR.  Last OV & CXray reviewed.  Allergies: 1)  ! Sulfa 2)  ! Penicillin 3)  ! Lotensin 4)  ! Diovan 5)  ! Maxzide 6)  ! Flagyl 7)  ! Demadex 8)  ! Metronidazole 9)  ! * Spironolactone 10)  ! * Bee Stings 11)  ! * Shell Fish  Review of Systems ENT:  Complains of earache and hoarseness; denies difficulty swallowing and ear discharge; No frontal headache , facial pain , or purulence. GI:  Denies bloody stools and dark tarry stools. Allergy:  Complains of sneezing; denies itching eyes.  Physical Exam  General:  in no acute distress; alert,appropriate and cooperative throughout  examination;overweight-appearing.   Ears:  External ear exam shows no significant lesions or deformities.  Otoscopic examination reveals clear canals, tympanic membranes are intact bilaterally without bulging, retraction, inflammation or discharge. Hearing is grossly normal bilaterally. Nose:  External nasal examination shows no deformity or inflammation. Nasal mucosa are  red without lesions or exudates. Mouth:  Oral mucosa and oropharynx without lesions or exudates.  Crowded oropharynx Lungs:  Normal respiratory effort, chest expands symmetrically. Lungs : mild rales @ bases. Brassy cough Heart:  Normal rate and regular rhythm. S1 and S2 normal without gallop, murmur, click, rub. Distant faint murmur Extremities:  1+ left pedal edema and 1+ right pedal edema.   no cyanosis or clubbing Skin:  Intact without suspicious lesions or rashes Cervical Nodes:  No lymphadenopathy noted Axillary Nodes:  No palpable lymphadenopathy Psych:  flat affect and subdued.     Impression & Recommendations:  Problem # 1:  BRONCHITIS-ACUTE (ICD-466.0)  The following medications were removed from the medication list:    Doxycycline Hyclate 100 Mg Caps (Doxycycline hyclate) .Marland Kitchen... 1 two times a day with food x 5 days then 1 once daily    Benzonatate 200 Mg Caps (Benzonatate) .Marland Kitchen... 1 q 6-8 hrs as needed cough Her updated medication list for this problem includes:    Proventil Hfa Aers (Albuterol sulfate aers) .Marland Kitchen... 1-2 puffs q 4 hours as needed  Symbicort 160-4.5 Mcg/act Aero (Budesonide-formoterol fumarate) .Marland Kitchen... 1-2 puffs every 12 hrs ; gargle after use    Promethazine Vc/codeine 6.25-5-10 Mg/66ml Syrp (Phenyleph-promethazine-cod) .Marland Kitchen... 1 tsp q 6 hrs as needed cough    Azithromycin 250 Mg Tabs (Azithromycin) .Marland Kitchen... As per pack  Problem # 2:  ASTHMA NOS W/O STATUS ASTHMATICUS (ICD-493.90)  Her updated medication list for this problem includes:    Proventil Hfa Aers (Albuterol sulfate aers) .Marland Kitchen... 1-2 puffs q 4  hours as needed    Symbicort 160-4.5 Mcg/act Aero (Budesonide-formoterol fumarate) .Marland Kitchen... 1-2 puffs every 12 hrs ; gargle after use  Complete Medication List: 1)  Onetouch Ultrasoft Lancets Misc (Lancets) .... Use as directed 2)  One Touch Ultra Strips  .... Pt test one time a day 3)  Simvastatin 40 Mg Tabs (Simvastatin) .Marland Kitchen.. 1 by mouth at bedtime 4)  One-daily Multivitamins Tabs (Multiple vitamin) .Marland Kitchen.. 1 by mouth qd 5)  Verapamil Hcl Cr 360 Mg Cp24 (Verapamil hcl) .Marland Kitchen.. 1 by mouth qd 6)  Proventil Hfa Aers (Albuterol sulfate aers) .Marland Kitchen.. 1-2 puffs q 4 hours as needed 7)  Carvedilol 25 Mg Tabs (Carvedilol) .Marland Kitchen.. 1 two times a day 8)  Ecotrin 81mg   .... Once daily 9)  Fish Oil 1000 Mg Caps (Omega-3 fatty acids) .Marland Kitchen.. 1 by mouth once daily 10)  Flax Seed Oil 1000 Mg Caps (Flaxseed (linseed)) .Marland Kitchen.. 1 by mouth three times a day 11)  Hydralazine Hcl 25 Mg Tabs (Hydralazine hcl) .... 2 two times a day 12)  Oxycodone-acetaminophen 5-325 Mg Tabs (Oxycodone-acetaminophen) .Marland Kitchen.. 1 by mouth every 4-6 hours as needed 13)  Amlodipine Besylate 5 Mg Tabs (Amlodipine besylate) .Marland Kitchen.. 1 once daily if bp not < 140/90 with increased bp meds 14)  Topiramate 50 Mg Tabs (Topiramate) .Marland Kitchen.. 1 by mouth two times a day 15)  Symbicort 160-4.5 Mcg/act Aero (Budesonide-formoterol fumarate) .Marland Kitchen.. 1-2 puffs every 12 hrs ; gargle after use 16)  Promethazine Vc/codeine 6.25-5-10 Mg/52ml Syrp (Phenyleph-promethazine-cod) .Marland Kitchen.. 1 tsp q 6 hrs as needed cough 17)  Azithromycin 250 Mg Tabs (Azithromycin) .... As per pack  Patient Instructions: 1)  Drink as much fluid as you can tolerate for the next few days.Sigular 10 mg once daily (Note: #7 as samples provided as trial  due to possible extrinsic component to cough & sneezing) Prescriptions: AZITHROMYCIN 250 MG TABS (AZITHROMYCIN) as per pack  #1 x 0   Entered and Authorized by:   Unice Cobble MD   Signed by:   Unice Cobble MD on 03/06/2009   Method used:   Printed then faxed to ...        [facility] [phone]* (retail)       [place], [place]  [address]       Ph: [phone]       Fax: [phone]   RxID:   [id number] PROMETHAZINE VC/CODEINE 6.25-5-10 MG/5ML SYRP (PHENYLEPH-PROMETHAZINE-COD) 1 tsp q 6 hrs as needed cough  #120 cc x 0   Entered and Authorized by:   Unice Cobble MD   Signed by:   Unice Cobble MD on 03/06/2009   Method used:   Printed then faxed to ...       [facility] [phone]* (retail)       [place], [place]  [address]       Ph: [phone]       Fax: [phone]   RxID:  [id number] SYMBICORT 160-4.5 MCG/ACT AERO (BUDESONIDE-FORMOTEROL FUMARATE) 1-2 puffs every 12 hrs ; gargle after use  #1 x 5   Entered and Authorized by:   Unice Cobble MD   Signed by:   Unice Cobble MD on 03/06/2009   Method used:   Print then Give to Patient   RxID:   [id number]

## 2010-03-04 NOTE — Assessment & Plan Note (Signed)
Summary: 3 mth fu/kdc   Vital Signs:  Patient profile:   60 year old April [name] Weight:      402.8 pounds BMI:     69.39 Pulse rate:   7 / minute Resp:     18 per minute BP sitting:   136 / 82  (left arm) Cuff size:   large  Vitals Entered By: Georgette Dover (Jun 24, 2009 3:April PM) CC: 3 month follow-up, Hypertension Management Comments REVIEWED MED LIST, PATIENT AGREED DOSE AND INSTRUCTION CORRECT    Primary Care Provider:  Unice Cobble MD  CC:  3 month follow-up and Hypertension Management.  History of Present Illness: Labs reviewed & risks discussed. K+ is now WNL  ; am Cortisol WNL. She has been intolerant to Spironolactone due to "kidney" issues. BP @ home not recorded.  Hypertension History:      She complains of headache, peripheral edema, and neurologic problems, but denies chest pain, palpitations, dyspnea with exertion, orthopnea, PND, visual symptoms, and syncope.  Occasional headache responsive to rest & ASA.Occasional "racing". Neuro: see Dr Candis Musa note(reviewed).        Positive major cardiovascular risk factors include April [name] age 42 years old or older, diabetes, hyperlipidemia, and hypertension.  Negative major cardiovascular risk factors include negative family history for ischemic heart disease and non-tobacco-user status.        Positive history for target organ damage include ASHD (either angina/prior MI/prior CABG).  Further assessment for target organ damage reveals no history of stroke/TIA or peripheral vascular disease.     Allergies: 1)  ! Sulfa 2)  ! Penicillin 3)  ! Lotensin 4)  ! Diovan 5)  ! Maxzide 6)  ! Flagyl 7)  ! Demadex 8)  ! Metronidazole 9)  ! * Spironolactone 10)  ! * Bee Stings 11)  ! * Shell Fish  Physical Exam  General:  in no acute distress; alert,appropriate and cooperative throughout examination;overweight-appearing.   Lungs:  Normal respiratory effort, chest expands symmetrically. Lungs are clear to auscultation, no crackles or  wheezes. Heart:  Normal rate and regular rhythm. S1 and S2 normal without gallop, murmur, click, rub . S4 with slurring Pulses:  R and L carotid,radial,dorsalis pedis and posterior tibial pulses are full and equal bilaterally Extremities:  No clubbing, cyanosis. Lipedema present. Skin:  Intact without suspicious lesions or rashes Psych:  memory intact for recent and remote, normally interactive, and good eye contact.     Impression & Recommendations:  Problem # 1:  HYPERTENSION, ESSENTIAL NOS (ICD-401.9) control adequate Her updated medication list for this problem includes:    Verapamil Hcl Cr 360 Mg Cp24 (Verapamil hcl) .Marland Kitchen... 1 by mouth qd    Carvedilol 25 Mg Tabs (Carvedilol) .Marland Kitchen... 1 two times a day    Hydralazine Hcl 25 Mg Tabs (Hydralazine hcl) .Marland Kitchen... 2 two times a day    Amlodipine Besylate 5 Mg Tabs (Amlodipine besylate) .Marland Kitchen... 1 once daily if bp not < 140/90 with increased bp meds    Furosemide 40 Mg Tabs (Furosemide) .Marland Kitchen... Take 1/2 to 1 tab by mouth once daily as needed for edema  Problem # 2:  HYPOKALEMIA (ICD-276.8) corrected  Complete Medication List: 1)  Onetouch Ultrasoft Lancets Misc (Lancets) .... Use as directed 2)  One Touch Ultra Strips  .... Pt test one time a day 3)  Simvastatin 40 Mg Tabs (Simvastatin) .Marland Kitchen.. 1 by mouth at bedtime 4)  One-daily Multivitamins Tabs (Multiple vitamin) .Marland Kitchen.. 1 by mouth qd 5)  Verapamil Hcl Cr  360 Mg Cp24 (Verapamil hcl) .Marland Kitchen.. 1 by mouth qd 6)  Proventil Hfa Aers (Albuterol sulfate aers) .Marland Kitchen.. 1-2 puffs q 4 hours as needed 7)  Carvedilol 25 Mg Tabs (Carvedilol) .Marland Kitchen.. 1 two times a day 8)  Ecotrin 81mg   .... Once daily 9)  Fish Oil 1000 Mg Caps (Omega-3 fatty acids) .Marland Kitchen.. 1 by mouth once daily 10)  Flax Seed Oil 1000 Mg Caps (Flaxseed (linseed)) .Marland Kitchen.. 1 by mouth three times a day 11)  Hydralazine Hcl 25 Mg Tabs (Hydralazine hcl) .... 2 two times a day 12)  Oxycodone-acetaminophen 5-325 Mg Tabs (Oxycodone-acetaminophen) .Marland Kitchen.. 1 by mouth every  4-6 hours as needed 13)  Amlodipine Besylate 5 Mg Tabs (Amlodipine besylate) .Marland Kitchen.. 1 once daily if bp not < 140/90 with increased bp meds 14)  Topiramate 50 Mg Tabs (Topiramate) .Marland Kitchen.. 1 by mouth two times a day 15)  Symbicort 160-4.5 Mcg/act Aero (Budesonide-formoterol fumarate) .Marland Kitchen.. 1-2 puffs every 12 hrs ; gargle after use 16)  Klor-con M20 20 Meq Cr-tabs (Potassium chloride crys cr) .Marland Kitchen.. 1 pill with  as needed fluid pill 17)  Furosemide 40 Mg Tabs (Furosemide) .... Take 1/2 to 1 tab by mouth once daily as needed for edema  Other Orders: Tdap => 67yrs IM [id number]) Admin 1st Vaccine [id number])  Hypertension Assessment/Plan:      The patient's hypertensive risk group is category C: Target organ damage and/or diabetes.  Today's blood pressure is 136/82.    Patient Instructions: 1)  Check your Blood Pressure regularly. If it is above:140/90 ON AVERAGE  you should make an appointment.   Immunizations Administered:  Tetanus Vaccine:    Vaccine Type: Tdap    Site: right deltoid    Mfr: GlaxoSmithKline    Dose: 0.5 ml    Route: IM    Given by: Chrae Malloy    Exp. Date: 04/27/2011    Lot #: [id number]

## 2010-03-04 NOTE — Miscellaneous (Signed)
Summary: Orders Update  Clinical Lists Changes  Orders: Added new Test order of T-2 View CXR (71020TC) - Signed 

## 2010-03-04 NOTE — Assessment & Plan Note (Signed)
Summary: cough,congested/cbs   Vital Signs:  Patient profile:   60 year old female Weight:      383.4 pounds Temp:     99.5 degrees F oral Pulse rate:   84 / minute Resp:     17 per minute BP sitting:   130 / 82  (left arm) Cuff size:   large  Vitals Entered By: Georgette Dover (July 03, 2009 4:00 PM) CC: Cough,  congestion,  and fever since last week Comments REVIEWED MED LIST, PATIENT AGREED DOSE AND INSTRUCTION CORRECT    Primary Care Provider:  Unice Cobble MD  CC:  Cough, congestion, and and fever since last week.  History of Present Illness: Onset last Fri 06/28/2009 as ST followed by earache & NP cough. Rx: OTC cough drops,  leftover Tessalon perles w/o benefit  Allergies: 1)  ! Sulfa 2)  ! Penicillin 3)  ! Lotensin 4)  ! Diovan 5)  ! Maxzide 6)  ! Flagyl 7)  ! Demadex 8)  ! Metronidazole 9)  ! * Spironolactone 10)  ! * Bee Stings 11)  ! * Shell Fish  Review of Systems General:  Complains of chills, fever, and sweats. ENT:  Complains of earache, nasal congestion, postnasal drainage, sinus pressure, and sore throat; Frontal headcahe, facial pain, purulenec. Resp:  Complains of cough, sputum productive, and wheezing; denies shortness of breath; Scant yellow.  Physical Exam  General:  in no acute distress; alert,appropriate and cooperative throughout examination Ears:  External ear exam shows no significant lesions or deformities.  Otoscopic examination reveals clear canals, tympanic membranes are intact bilaterally without bulging, retraction, inflammation or discharge. Hearing is grossly normal bilaterally. Nose:  External nasal examination shows no deformity or inflammation. Nasal mucosa are  mildly  erythematous without lesions or exudates. Mouth:  Oral mucosa and oropharynx without lesions or exudates.  Teeth in good repair. Lungs:  Normal respiratory effort, chest expands symmetrically. Lungs are clear to auscultation, no crackles or wheezes. Dry raspy  cough Heart:  normal rate, regular rhythm, no gallop, no rub, no JVD, and grade 1 /6 systolic murmur.  normal rate, regular rhythm, no gallop, no rub, and no JVD.   Cervical Nodes:  No lymphadenopathy noted Axillary Nodes:  No palpable lymphadenopathy   Impression & Recommendations:  Problem # 1:  SINUSITIS- ACUTE-NOS (ICD-461.9)  Her updated medication list for this problem includes:    Doxycycline Hyclate 100 Mg Caps (Doxycycline hyclate) .Marland Kitchen... 1 two times a day x 5 days then 1 once daily    Promethazine Vc/codeine 6.25-5-10 Mg/24ml Syrp (Phenyleph-promethazine-cod) .Marland Kitchen... 1 tsp every 6 hrs as needed cough  Problem # 2:  BRONCHITIS-ACUTE (ICD-466.0)  Her updated medication list for this problem includes:    Proventil Hfa Aers (Albuterol sulfate aers) .Marland Kitchen... 1-2 puffs q 4 hours as needed    Symbicort 160-4.5 Mcg/act Aero (Budesonide-formoterol fumarate) .Marland Kitchen... 1-2 puffs every 12 hrs ; gargle after use    Doxycycline Hyclate 100 Mg Caps (Doxycycline hyclate) .Marland Kitchen... 1 two times a day x 5 days then 1 once daily    Promethazine Vc/codeine 6.25-5-10 Mg/47ml Syrp (Phenyleph-promethazine-cod) .Marland Kitchen... 1 tsp every 6 hrs as needed cough  Complete Medication List: 1)  Onetouch Ultrasoft Lancets Misc (Lancets) .... Use as directed 2)  One Touch Ultra Strips  .... Pt test one time a day 3)  Simvastatin 40 Mg Tabs (Simvastatin) .Marland Kitchen.. 1 by mouth at bedtime 4)  One-daily Multivitamins Tabs (Multiple vitamin) .Marland Kitchen.. 1 by mouth qd 5)  Verapamil Hcl Cr 360 Mg Cp24 (Verapamil hcl) .Marland Kitchen.. 1 by mouth qd 6)  Proventil Hfa Aers (Albuterol sulfate aers) .Marland Kitchen.. 1-2 puffs q 4 hours as needed 7)  Carvedilol 25 Mg Tabs (Carvedilol) .Marland Kitchen.. 1 two times a day 8)  Ecotrin 81mg   .... Once daily 9)  Fish Oil 1000 Mg Caps (Omega-3 fatty acids) .Marland Kitchen.. 1 by mouth once daily 10)  Flax Seed Oil 1000 Mg Caps (Flaxseed (linseed)) .Marland Kitchen.. 1 by mouth three times a day 11)  Hydralazine Hcl 25 Mg Tabs (Hydralazine hcl) .... 2 two times a day 12)   Oxycodone-acetaminophen 5-325 Mg Tabs (Oxycodone-acetaminophen) .Marland Kitchen.. 1 by mouth every 4-6 hours as needed 13)  Amlodipine Besylate 5 Mg Tabs (Amlodipine besylate) .Marland Kitchen.. 1 once daily if bp not < 140/90 with increased bp meds 14)  Topiramate 50 Mg Tabs (Topiramate) .Marland Kitchen.. 1 by mouth two times a day 15)  Symbicort 160-4.5 Mcg/act Aero (Budesonide-formoterol fumarate) .Marland Kitchen.. 1-2 puffs every 12 hrs ; gargle after use 16)  Klor-con M20 20 Meq Cr-tabs (Potassium chloride crys cr) .Marland Kitchen.. 1 pill with  as needed fluid pill 17)  Furosemide 40 Mg Tabs (Furosemide) .... Take 1/2 to 1 tab by mouth once daily as needed for edema 18)  Doxycycline Hyclate 100 Mg Caps (Doxycycline hyclate) .Marland Kitchen.. 1 two times a day x 5 days then 1 once daily 19)  Promethazine Vc/codeine 6.25-5-10 Mg/60ml Syrp (Phenyleph-promethazine-cod) .Marland Kitchen.. 1 tsp every 6 hrs as needed cough  Patient Instructions: 1)  Drink as much fluid as you can tolerate for the next few days. Avoid direct sun. Prescriptions: PROMETHAZINE VC/CODEINE 6.25-5-10 MG/5ML SYRP (PHENYLEPH-PROMETHAZINE-COD) 1 tsp every 6 hrs as needed cough  #120cc x 0   Entered and Authorized by:   Unice Cobble MD   Signed by:   Unice Cobble MD on 07/03/2009   Method used:   Printed then faxed to ...       [facility] [phone]* (retail)       [place], [place]  [address]       Ph: [phone]       Fax: [phone]   RxID:   [id number] DOXYCYCLINE HYCLATE 100 MG CAPS (DOXYCYCLINE HYCLATE) 1 two times a day X 5 days then 1 once daily  #15 x 0   Entered and Authorized by:   Unice Cobble MD   Signed by:   Unice Cobble MD on 07/03/2009   Method used:   Faxed to ...       [facility] [phone]* (retail)       [place], [place]  [address]       Ph: [phone]       Fax: [phone]   RxID:   [id number]

## 2010-03-04 NOTE — Letter (Signed)
Summary: [facility]  [facility]   Imported By: Edmonia James 05/04/2009 10:29:08  _____________________________________________________________________  External Attachment:    Type:   Image     Comment:   External Document

## 2010-03-04 NOTE — Progress Notes (Signed)
Summary: cxr results  Phone Note Call from Patient Call back at Home Phone [phone]   Caller: Patient Summary of Call: pt called requesting CXR results, pt informed Per Dr Linna Darner very good report, and to see him if cough persist or progresses, pt also informed this was mailed 02/20/09  Initial call taken by: Verdie Mosher,  February 21, 2009 3:56 PM

## 2010-03-04 NOTE — Progress Notes (Signed)
Summary: Nausea  Phone Note Call from Patient Call back at Home Phone [phone]   Details for Reason: yesterdays prescription was re-sent b/c pharmacy claims to have never received them. Summary of Call: Patient notes that her nausea is severe and is interrupting her sleep. Please advise. Initial call taken by: Ernestene Mention CMA,  January 09, 2010 8:17 AM  Follow-up for Phone Call        Per MD the patient can have Phenergan 12.5mg  1 by mouth q6h as needed nausea #5. Follow-up by: Ernestene Mention CMA,  January 09, 2010 9:30 AM  Additional Follow-up for Phone Call Additional follow up Details #1::        Patient notified. Additional Follow-up by: Ernestene Mention CMA,  January 09, 2010 9:41 AM    New/Updated Medications: PROMETHAZINE HCL 12.5 MG TABS (PROMETHAZINE HCL) 1 by mouth q6h as needed nausea Prescriptions: PROMETHAZINE HCL 12.5 MG TABS (PROMETHAZINE HCL) 1 by mouth q6h as needed nausea  #5 x 0   Entered by:   Ernestene Mention CMA   Authorized by:   Unice Cobble MD   Signed by:   Ernestene Mention CMA on 01/09/2010   Method used:   Printed then faxed to ...       [facility] [phone]* (retail)       [place], [place]  [address]       Ph: [phone]       Fax: [phone]   RxID:   [id number] PREDNISONE 20 MG TABS (PREDNISONE) 1/2 three times a day with meals  #9 x 0   Entered by:   Ernestene Mention CMA   Authorized by:   Unice Cobble MD   Signed by:   Ernestene Mention CMA on 01/09/2010   Method used:   Electronically to        [facility] [phone]* (retail)       [place], [place]  [address]       Ph: [phone]       Fax: [phone]   RxID:   [id number] AZITHROMYCIN 250 MG TABS (AZITHROMYCIN) as per pack  #1 x 0   Entered by:   Ernestene Mention CMA   Authorized by:   Unice Cobble MD   Signed by:   Ernestene Mention CMA on 01/09/2010   Method used:   Electronically to        [facility] [phone]* (retail)       [address]       [place], [place]  [address]       Ph: [phone]       Fax: [phone]   RxID:   [id number]

## 2010-03-04 NOTE — Assessment & Plan Note (Signed)
Summary: flu shot/kn   Nurse Visit  CC: Flu shot./kb   Allergies: 1)  ! Sulfa 2)  ! Penicillin 3)  ! Lotensin 4)  ! Diovan 5)  ! Maxzide 6)  ! Flagyl 7)  ! Demadex 8)  ! Metronidazole 9)  ! * Spironolactone 10)  ! * Bee Stings 11)  ! * Shell Fish  Orders Added: 1)  Flu Vaccine 63yrs + MEDICARE PATIENTS [Q2039] 2)  Administration Flu vaccine - MCR [id number]         Flu Vaccine Consent Questions     Do you have a history of severe allergic reactions to this vaccine? no    Any prior history of allergic reactions to egg and/or gelatin? no    Do you have a sensitivity to the preservative Thimersol? no    Do you have a past history of Guillan-Barre Syndrome? no    Do you currently have an acute febrile illness? no    Have you ever had a severe reaction to latex? no    Vaccine information given and explained to patient? yes    Are you currently pregnant? no    Lot Number:[id number]   Exp Date:08/02/2010   Site Given  Left Deltoid IMu Ernestene Mention CMA  October 29, 2009 10:43 AM

## 2010-03-04 NOTE — Assessment & Plan Note (Signed)
Summary: OV AND DISCUSS LABS//PH   Vital Signs:  Patient profile:   60 year old female Weight:      398 pounds Pulse rate:   72 / minute Resp:     15 per minute BP sitting:   132 / 80  (left arm)  Vitals Entered By: Malachi Bonds (April 01, 2009 10:31 AM) CC: discuss labs, Hypertension Management   Primary Care Provider:  Unice Cobble MD  CC:  discuss labs and Hypertension Management.  History of Present Illness: Labs reviewed & risks discussed. No med identified to cause low K+(3.3).No OTC diuretics or laxatives. She believes she has a Rx diuretic taken as needed  . A1c in NON Diabetes range @ 5.7%.  Hypertension History:      She complains of headache, dyspnea with exertion, peripheral edema, and neurologic problems, but denies chest pain, palpitations, orthopnea, PND, visual symptoms, syncope, and side effects from treatment.  She notes no problems with any antihypertensive medication side effects.  Further comments include: BP varies ; she does know average. Headache over crown "when BP up". Edema intermittent.No excess salt intake.        Positive major cardiovascular risk factors include female age 52 years old or older, diabetes, hyperlipidemia, and hypertension.  Negative major cardiovascular risk factors include negative family history for ischemic heart disease and non-tobacco-user status.        Positive history for target organ damage include ASHD (either angina/prior MI/prior CABG).  Further assessment for target organ damage reveals no history of stroke/TIA or peripheral vascular disease.     Allergies: 1)  ! Sulfa 2)  ! Penicillin 3)  ! Lotensin 4)  ! Diovan 5)  ! Maxzide 6)  ! Flagyl 7)  ! Demadex 8)  ! Metronidazole 9)  ! * Spironolactone 10)  ! * Bee Stings 11)  ! * Shell Fish  Review of Systems Eyes:  Denies blurring, double vision, and vision loss-both eyes. Neuro:  Complains of numbness and tingling; N&T in thighs stable, monitored by Dr  Jannifer Franklin.  Physical Exam  General:  in no acute distress; alert,appropriate and cooperative throughout examination Lungs:  Normal respiratory effort, chest expands symmetrically. Lungs are clear to auscultation, no crackles or wheezes. Heart:  regular rhythm and bradycardia.  S4 Pulses:  R and L carotid,radial  pulses are full and equal bilaterally. Slightly decreased pedal pulses Extremities:  trace left pedal edema and trace right pedal edema.   Neurologic:  alert & oriented X3.   Skin:  Intact without suspicious lesions or rashes. No stasis changes in LE Psych:  memory intact for recent and remote, normally interactive, and good eye contact.     Impression & Recommendations:  Problem # 1:  HYPERTENSION, ESSENTIAL NOS (ICD-401.9) controlled(Note: Amlodipine as needed only) Her updated medication list for this problem includes:    Verapamil Hcl Cr 360 Mg Cp24 (Verapamil hcl) .Marland Kitchen... 1 by mouth qd    Carvedilol 25 Mg Tabs (Carvedilol) .Marland Kitchen... 1 two times a day    Hydralazine Hcl 25 Mg Tabs (Hydralazine hcl) .Marland Kitchen... 2 two times a day    Amlodipine Besylate 5 Mg Tabs (Amlodipine besylate) .Marland Kitchen... 1 once daily if bp not < 140/90 with increased bp meds  Problem # 2:  HYPOKALEMIA (ICD-276.8)  Complete Medication List: 1)  Onetouch Ultrasoft Lancets Misc (Lancets) .... Use as directed 2)  One Touch Ultra Strips  .... Pt test one time a day 3)  Simvastatin 40 Mg Tabs (Simvastatin) .Marland KitchenMarland KitchenMarland Kitchen 1  by mouth at bedtime 4)  One-daily Multivitamins Tabs (Multiple vitamin) .Marland Kitchen.. 1 by mouth qd 5)  Verapamil Hcl Cr 360 Mg Cp24 (Verapamil hcl) .Marland Kitchen.. 1 by mouth qd 6)  Proventil Hfa Aers (Albuterol sulfate aers) .Marland Kitchen.. 1-2 puffs q 4 hours as needed 7)  Carvedilol 25 Mg Tabs (Carvedilol) .Marland Kitchen.. 1 two times a day 8)  Ecotrin 81mg   .... Once daily 9)  Fish Oil 1000 Mg Caps (Omega-3 fatty acids) .Marland Kitchen.. 1 by mouth once daily 10)  Flax Seed Oil 1000 Mg Caps (Flaxseed (linseed)) .Marland Kitchen.. 1 by mouth three times a day 11)  Hydralazine  Hcl 25 Mg Tabs (Hydralazine hcl) .... 2 two times a day 12)  Oxycodone-acetaminophen 5-325 Mg Tabs (Oxycodone-acetaminophen) .Marland Kitchen.. 1 by mouth every 4-6 hours as needed 13)  Amlodipine Besylate 5 Mg Tabs (Amlodipine besylate) .Marland Kitchen.. 1 once daily if bp not < 140/90 with increased bp meds 14)  Topiramate 50 Mg Tabs (Topiramate) .Marland Kitchen.. 1 by mouth two times a day 15)  Symbicort 160-4.5 Mcg/act Aero (Budesonide-formoterol fumarate) .Marland Kitchen.. 1-2 puffs every 12 hrs ; gargle after use 16)  Klor-con M20 20 Meq Cr-tabs (Potassium chloride crys cr) .Marland Kitchen.. 1 pill with  as needed fluid pill  Hypertension Assessment/Plan:      The patient's hypertensive risk group is category C: Target organ damage and/or diabetes.  Today's blood pressure is 132/80.    Patient Instructions: 1)  Use "No Salt " @ the table; continue citrus & bananas. 2)  Please schedule a follow-up appointment in 3 months. 3)  Limit your Sodium (Salt) to less than 4 grams a day (slightly less than 1 teaspoon) to prevent fluid retention, swelling, or worsening or symptoms.Take K+ with the fluid pill only. 4)  Fasting Cortisol ,BUN,creat, K+ prior to visit, ICD-9: Prescriptions: KLOR-CON M20 20 MEQ CR-TABS (POTASSIUM CHLORIDE CRYS CR) 1 pill with  as needed fluid pill  #30 x 2   Entered and Authorized by:   Unice Cobble MD   Signed by:   Unice Cobble MD on 04/01/2009   Method used:   Print then Give to Patient   RxID:   [id number]

## 2010-03-04 NOTE — Assessment & Plan Note (Signed)
Summary: rto labs.cbs   Vital Signs:  Patient profile:   60 year old female Weight:      396.2 pounds Temp:     98.4 degrees F Resp:     17 per minute BP sitting:   130 / 82  Primary Care Provider:  Unice Cobble MD   History of Present Illness: Hyperlipidemia Follow-Up      This is a 60 year old woman who presents for Hyperlipidemia follow-up.  The patient denies GI upset, abdominal pain, flushing, itching, constipation, diarrhea, and fatigue.  Other symptoms include exercise intolerance due to MS issues, dypsnea from deconditioning, and pedal edema.  The patient denies the following symptoms: chest pain/pressure, palpitations, and syncope.  Compliance with medications (by patient report) has been near 100%.  Dietary compliance has been fair.  The patient reports no exercise.  Adjunctive measures currently used by the patient include ASA and fish oil supplements.   She is on Simvastatin 40 mg at bedtime & Verapamil in am for 2 years w/o complication; she is intolerant to Diovan & Lotensin. Lipids are @ goal & CK is normal. PMH of asthma which eliminates B blocker Rx.  Allergies: 1)  ! Sulfa 2)  ! Penicillin 3)  ! Lotensin 4)  ! Diovan 5)  ! Maxzide 6)  ! Flagyl 7)  ! Demadex 8)  ! Metronidazole 9)  ! * Spironolactone 10)  ! * Bee Stings 11)  ! * Shell Fish  Review of Systems General:  Denies chills, fever, and sweats. ENT:  Complains of earache; denies ear discharge; L earache.  Physical Exam  General:  in no acute distress; alert,appropriate and cooperative throughout examination Ears:  External ear exam shows no significant lesions or deformities.  Otoscopic examination reveals clear canals, tympanic membranes are intact bilaterally without bulging, retraction, inflammation or discharge. Hearing is grossly normal bilaterally. Lungs:  Normal respiratory effort, chest expands symmetrically. Lungs are clear to auscultation, no crackles or wheezes. Heart:  Normal rate and  regular rhythm. S1 and S2 normal without gallop, murmur, click, rub . distant heart sounds Pulses:  R and L carotid,radial  pulses are full and equal bilaterally. Decreased pedal pulses Extremities:  trace left pedal edema and trace right pedal edema.   Skin:  Phlebolith R forearm Cervical Nodes:  No lymphadenopathy noted Axillary Nodes:  No palpable lymphadenopathy   Impression & Recommendations:  Problem # 1:  HYPERLIPIDEMIA (ICD-272.4) controlledHer updated medication list for this problem includes:    Simvastatin 40 Mg Tabs (Simvastatin) .Marland Kitchen... 1 by mouth at bedtime  Problem # 2:  HYPERTENSION, ESSENTIAL NOS (ICD-401.9) controlled Her updated medication list for this problem includes:    Verapamil Hcl Cr 360 Mg Cp24 (Verapamil hcl) .Marland Kitchen... 1 by mouth qd    Carvedilol 25 Mg Tabs (Carvedilol) .Marland Kitchen... 1 two times a day    Hydralazine Hcl 25 Mg Tabs (Hydralazine hcl) .Marland Kitchen... 2 two times a day    Amlodipine Besylate 5 Mg Tabs (Amlodipine besylate) .Marland Kitchen... 1 once daily if bp not < 140/90 with increased bp meds    Furosemide 40 Mg Tabs (Furosemide) .Marland Kitchen... Take 1/2 to 1 tab by mouth once daily as needed for edema  Complete Medication List: 1)  Onetouch Ultrasoft Lancets Misc (Lancets) .... Use as directed 2)  One Touch Ultra Strips  .... Pt test one time a day 3)  Simvastatin 40 Mg Tabs (Simvastatin) .Marland Kitchen.. 1 by mouth at bedtime 4)  One-daily Multivitamins Tabs (Multiple vitamin) .Marland Kitchen.. 1 by  mouth qd 5)  Verapamil Hcl Cr 360 Mg Cp24 (Verapamil hcl) .Marland Kitchen.. 1 by mouth qd 6)  Proventil Hfa Aers (Albuterol sulfate aers) .Marland Kitchen.. 1-2 puffs q 4 hours as needed 7)  Carvedilol 25 Mg Tabs (Carvedilol) .Marland Kitchen.. 1 two times a day 8)  Ecotrin 81mg   .... Once daily 9)  Fish Oil 1000 Mg Caps (Omega-3 fatty acids) .Marland Kitchen.. 1 by mouth once daily 10)  Flax Seed Oil 1000 Mg Caps (Flaxseed (linseed)) .Marland Kitchen.. 1 by mouth three times a day 11)  Hydralazine Hcl 25 Mg Tabs (Hydralazine hcl) .... 2 two times a day 12)   Oxycodone-acetaminophen 5-325 Mg Tabs (Oxycodone-acetaminophen) .Marland Kitchen.. 1 by mouth every 4-6 hours as needed 13)  Amlodipine Besylate 5 Mg Tabs (Amlodipine besylate) .Marland Kitchen.. 1 once daily if bp not < 140/90 with increased bp meds 14)  Topiramate 50 Mg Tabs (Topiramate) .Marland Kitchen.. 1 by mouth two times a day 15)  Symbicort 160-4.5 Mcg/act Aero (Budesonide-formoterol fumarate) .Marland Kitchen.. 1-2 puffs every 12 hrs ; gargle after use 16)  Klor-con M20 20 Meq Cr-tabs (Potassium chloride crys cr) .Marland Kitchen.. 1 pill with  as needed fluid pill 17)  Furosemide 40 Mg Tabs (Furosemide) .... Take 1/2 to 1 tab by mouth once daily as needed for edema 18)  Doxycycline Hyclate 100 Mg Caps (Doxycycline hyclate) .Marland Kitchen.. 1 two times a day x 5 days then 1 once daily 19)  Promethazine Vc/codeine 6.25-5-10 Mg/98ml Syrp (Phenyleph-promethazine-cod) .Marland Kitchen.. 1 tsp every 6 hrs as needed cough  Patient Instructions: 1)  No change in meds as present regimen is safest in context of asthma & multiple drug intolerances. CK lab test as needed for any significant muscle pain. Please call with signs of ear infection as discussed.

## 2010-03-04 NOTE — Letter (Signed)
Summary: [facility]   Imported By: Edmonia James 03/26/2009 11:51:36  _____________________________________________________________________  External Attachment:    Type:   Image     Comment:   External Document

## 2010-03-04 NOTE — Medication Information (Signed)
Summary: Interaction with Verapamil & Simvastatin/[facility]  Interaction with Verapamil & Simvastatin/[facility]   Imported By: Edmonia James 09/20/2009 12:35:36  _____________________________________________________________________  External Attachment:    Type:   Image     Comment:   External Document

## 2010-03-04 NOTE — Assessment & Plan Note (Signed)
Summary: fever, chills, headache, weak, asthma wheezing///sph   Vital Signs:  Patient profile:   60 year old female Weight:      408.8 pounds BMI:     70.42 O2 Sat:      90 % Temp:     98.9 degrees F oral Pulse rate:   85 / minute BP sitting:   130 / 78  (left arm) Cuff size:   large  Vitals Entered By: Georgette Dover CMA (January 08, 2010 1:20 PM) CC: Fever, chills, cough, headache, wheezing, dry mouth, weak, nausea, and trouble sleeping due to pain. Onset: Saturday , URI symptoms, COPD follow-up, Dysuria   Primary Care Provider:  Unice Cobble MD  CC:  Fever, chills, cough, headache, wheezing, dry mouth, weak, nausea, and trouble sleeping due to pain. Onset: Saturday , URI symptoms, COPD follow-up, and Dysuria.  History of Present Illness:      This is a 60 year old woman who presents with RTIsymptoms; onset 12/03 as malaise& wheezing.  The patient now reports dry cough, but denies nasal congestion, purulent nasal discharge, sore throat, and earache.  Associated symptoms include low-grade fever (<100.5 degrees) & shaking chills, dyspnea, and wheezing.  The patient denies headache.  The patient denies the following risk factors for Strep sinusitis: unilateral facial pain, tooth pain, and tender adenopathy.   The patient reports nocturnal awakening and exercise induced symptoms, but denies chest tightness.  The patient reports limitation of most activities.  Current treatment includes MDI without spacer.  Symptoms are worse with exercise.        The patient also presents with  "bladder fullness" with discomfort.  The patient reports urinary frequency, but denies burning with urination, hematuria, and vaginal discharge.  Associated symptoms include nausea, fever, and shaking chills.  The patient denies the following associated symptoms: flank pain and pelvic pain.    Current Medications (verified): 1)  Onetouch Ultrasoft Lancets  Misc (Lancets) .... Use As Directed 2)  One Touch Ultra  Strips .... Pt Test One Time A Day 3)  Simvastatin 40 Mg Tabs (Simvastatin) .Marland Kitchen.. 1 By Mouth At Bedtime 4)  One-Daily Multivitamins   Tabs (Multiple Vitamin) .Marland Kitchen.. 1 By Mouth Qd 5)  Verapamil Hcl Cr 360 Mg  Cp24 (Verapamil Hcl) .Marland Kitchen.. 1 By Mouth Qd 6)  Proventil Hfa   Aers (Albuterol Sulfate Aers) .Marland Kitchen.. 1-2 Puffs Q 4 Hours As Needed 7)  Carvedilol 25 Mg Tabs (Carvedilol) .Marland Kitchen.. 1 Two Times A Day 8)  Ecotrin 81mg  .... Once Daily 9)  Fish Oil 1000 Mg  Caps (Omega-3 Fatty Acids) .Marland Kitchen.. 1 By Mouth Once Daily 10)  Flax Seed Oil 1000 Mg  Caps (Flaxseed (Linseed)) .Marland Kitchen.. 1 By Mouth Three Times A Day 11)  Hydralazine Hcl 25 Mg  Tabs (Hydralazine Hcl) .... 2 Two Times A Day 12)  Oxycodone-Acetaminophen 5-325 Mg Tabs (Oxycodone-Acetaminophen) .Marland Kitchen.. 1 By Mouth Every 4-6 Hours As Needed 13)  Amlodipine Besylate 5 Mg Tabs (Amlodipine Besylate) .Marland Kitchen.. 1 Once Daily If Bp Not < 140/90 With Increased Bp Meds 14)  Topiramate 50 Mg Tabs (Topiramate) .Marland Kitchen.. 1 By Mouth Two Times A Day 15)  Symbicort 160-4.5 Mcg/act Aero (Budesonide-Formoterol Fumarate) .Marland Kitchen.. 1-2 Puffs Every 12 Hrs ; Gargle After Use 16)  Klor-Con M20 20 Meq Cr-Tabs (Potassium Chloride Crys Cr) .Marland Kitchen.. 1 Pill With  As Needed Fluid Pill 17)  Furosemide 40 Mg Tabs (Furosemide) .... Take 1/2 To 1 Tab By Mouth Once Daily As Needed For Edema  Allergies: 1)  !  Sulfa 2)  ! Penicillin 3)  ! Lotensin 4)  ! Diovan 5)  ! Maxzide 6)  ! Flagyl 7)  ! Demadex 8)  ! Metronidazole 9)  ! * Spironolactone 10)  ! * Bee Stings 11)  ! * Shell Fish  Physical Exam  General:  appears tired but in no acute distress; alert,appropriate and cooperative throughout examination Ears:  External ear exam shows no significant lesions or deformities.  Otoscopic examination reveals clear canals, tympanic membranes are intact bilaterally without bulging, retraction, inflammation or discharge. Hearing is grossly normal bilaterally. Nose:  External nasal examination shows no deformity or  inflammation. Nasal mucosa are pink and moist without lesions or exudates. Mouth:  Oral mucosa and oropharynx without lesions or exudates.  Teeth in good repair. Lungs:  Normal respiratory effort, chest expands symmetrically. Lungs are clear to auscultation,but BS decreased . Some upper airway wheezing Heart:  Normal rate and regular rhythm. S1 and S2 normal without gallop, murmur, click, rub or other extra sounds. Abdomen:  No suprapubic tenderness Skin:  Intact without suspicious lesions or rashes Cervical Nodes:  No lymphadenopathy noted Axillary Nodes:  No palpable lymphadenopathy   Impression & Recommendations:  Problem # 1:  BRONCHITIS-ACUTE (ICD-466.0)  The following medications were removed from the medication list:    Doxycycline Hyclate 100 Mg Caps (Doxycycline hyclate) .Marland Kitchen... 1 two times a day x 5 days then 1 once daily    Promethazine Vc/codeine 6.25-5-10 Mg/45ml Syrp (Phenyleph-promethazine-cod) .Marland Kitchen... 1 tsp every 6 hrs as needed cough Her updated medication list for this problem includes:    Proventil Hfa Aers (Albuterol sulfate aers) .Marland Kitchen... 1-2 puffs q 4 hours as needed    Symbicort 160-4.5 Mcg/act Aero (Budesonide-formoterol fumarate) .Marland Kitchen... 1-2 puffs every 12 hrs ; gargle after use    Azithromycin 250 Mg Tabs (Azithromycin) .Marland Kitchen... As per pack  Orders: Venipuncture [id number]) TLB-CBC Platelet - w/Differential (85025-CBCD)  Problem # 2:  ASTHMA NOS W/ACUTE EXACERBATION [id number])  Her updated medication list for this problem includes:    Proventil Hfa Aers (Albuterol sulfate aers) .Marland Kitchen... 1-2 puffs q 4 hours as needed    Symbicort 160-4.5 Mcg/act Aero (Budesonide-formoterol fumarate) .Marland Kitchen... 1-2 puffs every 12 hrs ; gargle after use    Prednisone 20 Mg Tabs (Prednisone) .Marland Kitchen... 1/2 three times a day with meals  Orders: Venipuncture [id number])  Problem # 3:  FEVER (ICD-780.60) with chills Orders: Venipuncture [id number]) TLB-CBC Platelet - w/Differential (85025-CBCD) TLB-Udip w/  Micro (81001-URINE) T-Urine Culture (Spectrum Order) [id number])  Problem # 4:  ABDOMINAL PAIN, SUPRAPUBIC (ICD-789.09) with frequency Her updated medication list for this problem includes:    Oxycodone-acetaminophen 5-325 Mg Tabs (Oxycodone-acetaminophen) .Marland Kitchen... 1 by mouth every 4-6 hours as needed  Orders: Venipuncture [id number]) TLB-CBC Platelet - w/Differential (85025-CBCD) TLB-Udip w/ Micro (81001-URINE) T-Urine Culture (Spectrum Order) [id number])  Problem # 5:  URINARY URGENCY [id number])  Orders: TLB-Creatinine, Blood (82565-CREA) TLB-Potassium (K+) (84132-K)  Problem # 6:  DIABETES MELLITUS, TYPE II, CONTROLLED (ICD-250.00)  Orders: TLB-Creatinine, Blood (82565-CREA) TLB-Potassium (K+) (84132-K) TLB-BUN (Urea Nitrogen) (84520-BUN) TLB-A1C / Hgb A1C (Glycohemoglobin) (83036-A1C)  Complete Medication List: 1)  Onetouch Ultrasoft Lancets Misc (Lancets) .... Use as directed 2)  One Touch Ultra Strips  .... Pt test one time a day 3)  Simvastatin 40 Mg Tabs (Simvastatin) .Marland Kitchen.. 1 by mouth at bedtime 4)  One-daily Multivitamins Tabs (Multiple vitamin) .Marland Kitchen.. 1 by mouth qd 5)  Verapamil Hcl Cr 360 Mg Cp24 (Verapamil hcl) .Marland Kitchen.. 1 by mouth qd 6)  Proventil Hfa  Aers (Albuterol sulfate aers) .Marland Kitchen.. 1-2 puffs q 4 hours as needed 7)  Carvedilol 25 Mg Tabs (Carvedilol) .Marland Kitchen.. 1 two times a day 8)  Ecotrin 81mg   .... Once daily 9)  Fish Oil 1000 Mg Caps (Omega-3 fatty acids) .Marland Kitchen.. 1 by mouth once daily 10)  Flax Seed Oil 1000 Mg Caps (Flaxseed (linseed)) .Marland Kitchen.. 1 by mouth three times a day 11)  Hydralazine Hcl 25 Mg Tabs (Hydralazine hcl) .... 2 two times a day 12)  Oxycodone-acetaminophen 5-325 Mg Tabs (Oxycodone-acetaminophen) .Marland Kitchen.. 1 by mouth every 4-6 hours as needed 13)  Amlodipine Besylate 5 Mg Tabs (Amlodipine besylate) .Marland Kitchen.. 1 once daily if bp not < 140/90 with increased bp meds 14)  Topiramate 50 Mg Tabs (Topiramate) .Marland Kitchen.. 1 by mouth two times a day 15)  Symbicort 160-4.5 Mcg/act Aero  (Budesonide-formoterol fumarate) .Marland Kitchen.. 1-2 puffs every 12 hrs ; gargle after use 16)  Klor-con M20 20 Meq Cr-tabs (Potassium chloride crys cr) .Marland Kitchen.. 1 pill with  as needed fluid pill 17)  Furosemide 40 Mg Tabs (Furosemide) .... Take 1/2 to 1 tab by mouth once daily as needed for edema 18)  Azithromycin 250 Mg Tabs (Azithromycin) .... As per pack 19)  Prednisone 20 Mg Tabs (Prednisone) .... 1/2 three times a day with meals  Patient Instructions: 1)  Drink as much fluid as you can tolerate for the next few days. To ER if asthma progresses despite meds. Prescriptions: PREDNISONE 20 MG TABS (PREDNISONE) 1/2 three times a day with meals  #9 x 0   Entered and Authorized by:   Unice Cobble MD   Signed by:   Unice Cobble MD on 01/08/2010   Method used:   Faxed to ...       [facility] [phone]* (retail)       [place], [place]  [address]       Ph: [phone]       Fax: [phone]   RxID:   [id number] AZITHROMYCIN 250 MG TABS (AZITHROMYCIN) as per pack  #1 x 0   Entered and Authorized by:   Unice Cobble MD   Signed by:   Unice Cobble MD on 01/08/2010   Method used:   Faxed to ...       [facility] [phone]* (retail)       [place], [place]  [address]       Ph: [phone]       Fax: [phone]   RxID:   [id number] PROVENTIL HFA   AERS (ALBUTEROL SULFATE AERS) 1-2 puffs q 4 hours as needed  #1 x 2   Entered and Authorized by:   Unice Cobble MD   Signed by:   Unice Cobble MD on 01/08/2010   Method used:   Print then Give to Patient   RxID:   [id number] SYMBICORT 160-4.5 MCG/ACT AERO (BUDESONIDE-FORMOTEROL FUMARATE) 1-2 puffs every 12 hrs ; gargle after use  #1 x 5   Entered and Authorized by:   Unice Cobble MD   Signed by:   Unice Cobble MD on 01/08/2010   Method used:   Print then Give to Patient   RxID:   [id number]    Orders Added: 1)  Est. Patient Level IV [id number] 2)  Venipuncture [id number] 3)   TLB-CBC Platelet - w/Differential [85025-CBCD] 4)  TLB-Udip w/ Micro [81001-URINE] 5)  T-Urine Culture (Spectrum Order) [id number] 6)  TLB-Creatinine, Blood [82565-CREA] 7)  TLB-Potassium (K+) [84132-K] 8)  TLB-BUN (Urea Nitrogen) [84520-BUN] 9)  TLB-A1C / Hgb A1C (Glycohemoglobin) [83036-A1C]  Appended Document: fever, chills, headache, weak, asthma wheezing///sph   Appended Document: fever, chills, headache, weak, asthma wheezing///sph  Laboratory Results   Urine Tests   Date/Time Reported: January 08, 2010 2:29 PM   Routine Urinalysis   Color: red Appearance: Clear Glucose: negative   (Normal Range: Negative) Bilirubin: negative   (Normal Range: Negative) Ketone: negative   (Normal Range: Negative) Spec. Gravity: >=1.030   (Normal Range: 1.003-1.035) Blood: negative   (Normal Range: Negative) pH: 5.0   (Normal Range: 5.0-8.0) Protein: negative   (Normal Range: Negative) Urobilinogen: negative   (Normal Range: 0-1) Nitrite: negative   (Normal Range: Negative) Leukocyte Esterace: negative   (Normal Range: Negative)    Comments: Heath Lark  January 08, 2010 2:29 PM cx sent

## 2010-03-06 NOTE — Progress Notes (Signed)
Summary: test results   Phone Note Call from Patient Call back at Home Phone [phone]   Caller: Patient Reason for Call: Talk to Nurse, Lab or Test Results Initial call taken by: Neil Crouch,  January 23, 2010 5:01 PM  Follow-up for Phone Call        pt given lab results Kevan Rosebush, RN  January 23, 2010 5:38 PM

## 2010-03-06 NOTE — Assessment & Plan Note (Signed)
Summary: yearly/sl  Medications Added SPIRONOLACTONE 25 MG TABS (SPIRONOLACTONE) Take one tablet by mouth daily        Visit Type:  Follow-up Primary Provider:  Unice Cobble MD   History of Present Illness: April [name] is a very pleasant 60 year old woman with history of morbid obesity, hypertension, hyperlipidemia, and mild-to- moderate coronary artery disease.  She underwent catheterization in February 2008, which showed 50-60% LAD lesion and 30% RCA lesion.  EF was 70%.  She had mild pulmonary hypertension, primarily due to high output state.  Her mean pulmonary artery pressure was 27, wedge pressure was 10.  PVR was 1.80 Woods units.   Here for f/u. Feels exhausted all the time. Recent bronchitis treated with abx/steroids. Able to do ADLs but not moving much outside of that. Used to go to [employer] for [employer] but not going any more as caring for mother with dementia.   Occasional CP comes and goes. Nothing severe. BP typically 130/80. Followed by Dr. Huey Bienenstock. Still SOB with activity. Some edema.  Snores heavily. No witnessed apnea. We discussed testing for OSA previously but she refused.   Current Medications (verified): 1)  Onetouch Ultrasoft Lancets  Misc (Lancets) .... Use As Directed 2)  One Touch Ultra Strips .... Pt Test One Time A Day 3)  Simvastatin 40 Mg Tabs (Simvastatin) .Marland Kitchen.. 1 By Mouth At Bedtime 4)  One-Daily Multivitamins   Tabs (Multiple Vitamin) .Marland Kitchen.. 1 By Mouth Qd 5)  Verapamil Hcl Cr 360 Mg  Cp24 (Verapamil Hcl) .Marland Kitchen.. 1 By Mouth Qd 6)  Proventil Hfa   Aers (Albuterol Sulfate Aers) .Marland Kitchen.. 1-2 Puffs Q 4 Hours As Needed 7)  Carvedilol 25 Mg Tabs (Carvedilol) .Marland Kitchen.. 1 Two Times A Day 8)  Ecotrin 81mg  .... Once Daily 9)  Fish Oil 1000 Mg  Caps (Omega-3 Fatty Acids) .Marland Kitchen.. 1 By Mouth Once Daily 10)  Flax Seed Oil 1000 Mg  Caps (Flaxseed (Linseed)) .Marland Kitchen.. 1 By Mouth Three Times A Day 11)  Hydralazine Hcl 25 Mg  Tabs (Hydralazine Hcl) .... 2 Two Times A Day 12)   Oxycodone-Acetaminophen 5-325 Mg Tabs (Oxycodone-Acetaminophen) .Marland Kitchen.. 1 By Mouth Every 4-6 Hours As Needed 13)  Amlodipine Besylate 5 Mg Tabs (Amlodipine Besylate) .Marland Kitchen.. 1 Once Daily If Bp Not < 140/90 With Increased Bp Meds 14)  Topiramate 50 Mg Tabs (Topiramate) .Marland Kitchen.. 1 By Mouth Two Times A Day 15)  Symbicort 160-4.5 Mcg/act Aero (Budesonide-Formoterol Fumarate) .Marland Kitchen.. 1-2 Puffs Every 12 Hrs ; Gargle After Use 16)  Klor-Con M20 20 Meq Cr-Tabs (Potassium Chloride Crys Cr) .Marland Kitchen.. 1 Pill With  As Needed Fluid Pill 17)  Furosemide 40 Mg Tabs (Furosemide) .... Take 1/2 To 1 Tab By Mouth Once Daily As Needed For Edema  Allergies: 1)  ! Sulfa 2)  ! Penicillin 3)  ! Lotensin 4)  ! Diovan 5)  ! Maxzide 6)  ! Flagyl 7)  ! Demadex 8)  ! Metronidazole 9)  ! * Spironolactone 10)  ! * Bee Stings 11)  ! * Shell Fish  Past History:  Past Medical History: Last updated: 11/20/2008 CAD, NATIVE VESSEL (ICD-414.01) OSTEOARTHRITIS, KNEES, BILATERAL, SEVERE (ICD-715.96) DIABETES MELLITUS, TYPE II, CONTROLLED (ICD-250.00) HYPERLIPIDEMIA (ICD-272.4) HYPERTENSION, ESSENTIAL NOS (ICD-401.9) DERMATITIS (ICD-692.9) OBESITY, MORBID (ICD-278.01) HX, PERSONAL, PEPTIC ULCER DISEASE (ICD-V12.71) HYPOKALEMIA (ICD-276.8) 04/05/00-Redux therapy Myalgia paresthetica, Dr Doy Mince CTS  Review of Systems       As per HPI and past medical history; otherwise all systems negative.   Vital Signs:  Patient profile:  60 year old female Height:      64 inches Weight:      404 pounds BMI:     69.60 O2 Sat:      96 % Pulse rate:   77 / minute BP sitting:   190 / 105  (right arm)  Vitals Entered By: April [name] CMA (January 16, 2010 10:16 AM)  Physical Exam  General:   She is a morbidly obese woman who ambulates around the clinic very slowly with a walker.   HEENT:  Normal NECK:  Thick.  I am unable to assess JVD.  Carotids are 2+ bilaterally. No bruits.  CARDIAC:  She has very distant heart sounds with a regular  rate and rhythm.  2/6 systolic murmur RUSB. LUNGS:  Clear. ABDOMEN:  Morbidly obese.  Unable to appreciate for any organomegaly. No bruits or masses appreciated.  Good bowel sounds. EXTREMITIES:  Warm with no cyanosis or clubbing.  There is 1+ edema. L > R  No rashes. NEURO:  She is alert and oriented x3.  Cranial nerves 2-12 are intact. Moves all 4 extremities.    Impression & Recommendations:  Problem # 1:  CAD, NATIVE VESSEL (ICD-414.01) Stable. No evidence of ischemia. Continue current regimen.  Problem # 2:  FATIGUE / MALAISE (ICD-780.79) Suspect she has severe OSA. I re-encouraged her to proceed with sleep study. She will reconsider.   Problem # 3:  HYPERTENSION, ESSENTIAL NOS (ICD-401.9) BP markedly elevated today. Add spiro 25 once daily. Check BMET 1 week. Encouraged her to get tested for OSA and also be more active. She is currently on 2 calcium channel blockers (amlodipine and verapamil) and she will need to address this with Dr. Linna Darner.   Patient Instructions: 1)  Your physician recommends that you schedule a follow-up appointment in: 1 year with Dr. Haroldine Laws 2)  Your physician recommends that you return for lab work in: 1 week--December 22,2011 3)  Your physician has recommended you make the following change in your medication:Start spironolactone 25 mg by mouth daily Prescriptions: SPIRONOLACTONE 25 MG TABS (SPIRONOLACTONE) Take one tablet by mouth daily  #30 x 11   Entered by:   Thompson Grayer, RN, BSN   Authorized by:   Jolaine Artist, MD, [facility]   Signed by:   Thompson Grayer, RN, BSN on 01/16/2010   Method used:   Electronically to        [facility] [phone]* (retail)       [address]       [place], [place]  [address]       Ph: [phone]       Fax: [phone]   RxID:   [id number]

## 2010-03-27 ENCOUNTER — Encounter: Payer: Self-pay | Admitting: Internal Medicine

## 2010-04-10 NOTE — Letter (Signed)
Summary: [facility]   Imported By: Edmonia James 04/02/2010 [id number]  _____________________________________________________________________  External Attachment:    Type:   Image     Comment:   External Document

## 2010-05-18 ENCOUNTER — Other Ambulatory Visit: Payer: Self-pay | Admitting: Internal Medicine

## 2010-05-30 ENCOUNTER — Encounter: Payer: Self-pay | Admitting: Internal Medicine

## 2010-06-02 ENCOUNTER — Ambulatory Visit (INDEPENDENT_AMBULATORY_CARE_PROVIDER_SITE_OTHER): Payer: Medicare Other | Admitting: Internal Medicine

## 2010-06-02 ENCOUNTER — Encounter: Payer: Self-pay | Admitting: Internal Medicine

## 2010-06-02 DIAGNOSIS — E119 Type 2 diabetes mellitus without complications: Secondary | ICD-10-CM

## 2010-06-02 DIAGNOSIS — J40 Bronchitis, not specified as acute or chronic: Secondary | ICD-10-CM

## 2010-06-02 DIAGNOSIS — J45901 Unspecified asthma with (acute) exacerbation: Secondary | ICD-10-CM

## 2010-06-02 MED ORDER — AZITHROMYCIN 250 MG PO TABS: 250.0000 mg | ORAL_TABLET | Freq: Every day | ORAL | Status: AC

## 2010-06-02 MED ORDER — BUDESONIDE-FORMOTEROL FUMARATE 160-4.5 MCG/ACT IN AERO: 2.0000 | INHALATION_SPRAY | Freq: Two times a day (BID) | RESPIRATORY_TRACT | Status: DC

## 2010-06-02 MED ORDER — HYDROCODONE-HOMATROPINE 5-1.5 MG/5ML PO SYRP: 5.0000 mL | ORAL_SOLUTION | Freq: Four times a day (QID) | ORAL | Status: AC | PRN

## 2010-06-02 NOTE — Patient Instructions (Signed)
Use the cough syrup at bedtime to allow adequate rest and drink as much nondairy, room temperature fluids you  can for the next few days. Monitor your sugars; fasting goal is 90-150.  Gargle and spit after using the Symbicort.

## 2010-06-02 NOTE — Progress Notes (Signed)
Subjective:    Patient ID: April [name], female    DOB: Jan 18, 1951, 60 y.o.   MRN: [id number]  HPI Respiratory Tract Infection:  Onset/ symptoms: 10-12 days ago as   Dry cough, scratchy ST & asthma flare.                                    Exposures(illness  /environmental/extrinsic): weather changes & pollen.  Smoker:never.   Progression: worse with chills  & sleep disruption  from cough .  Treatments/ Response: tea with honey, lozenges, humidifier 7 Albuterol MDI 3 X/ day. Present  symptoms/ signs:croupy cough.   Fever , chills, sweats:  All 3 present. Frontal headache: no; Facial Pain: no ; Nasal purulence: no. Sore throat : this persists. Dental pain:no. Lymphadenopathy:no     Wheezing :yes. SOB: yes. Cough/ sputum/ hemoptysis: scant sputum only  . Pleuritic pain:  No.                Associated extrinsic/ allergic symptoms: itchy eyes:no; sneezing: no. PMH: Seasonal allergies:no. Asthma:adult onset.                                                                                                                                                                        Review of Systems FBS average 110-125; rare hypoglycemia responsive to snack.     Objective:   Physical Exam General appearance is  good  nourishment; no acute distress or increased work of breathing is present despite severe paroxysmal , barking cough.  No  lymphadenopathy about the head, neck, or axilla noted.   Eyes: No conjunctival inflammation or lid edema is present. There is no scleral icterus.  Ears:  External ear exam shows no significant lesions or deformities.  Otoscopic examination reveals clear canals, tympanic membranes are intact bilaterally without bulging, retraction, inflammation or discharge.  Nose:  External nasal examination shows no deformity or inflammation. Nasal mucosa are pink and moist without lesions or exudates. No septal dislocation or dislocation.No obstruction to airflow.   Oral exam: Dental hygiene  is good; lips and gums are healthy appearing.There is no oropharyngeal erythema or exudate noted.   Neck:  No deformities, thyromegaly, masses, or tenderness noted.   Supple with full range of motion without pain.   Heart:  Normal rate and regular rhythm. S1 and S2 normal without gallop, murmur, click, rub or other extra sounds. Heart sounds distant  Lungs:Chest clear to auscultation; no wheezes, rhonchi,rales ,or rubs present.No increased work of breathing. BS decreased   Extremities:  No cyanosis  or clubbing  noted . Trace - 1/2 + edema   Skin: Warm & dry w/o jaundice  or tenting.          Assessment & Plan:  #1 bronchitis w/o symptoms or signs of rhinosinusitis  #2 asthma flare secondary to #1. Albuterol use is excessive; a controller  agent is necessary.  #3 diabetes, excellent control by history  Plan: Antibiotics will be prescribed along with a controller agent. A cough suppressant will be added QHS

## 2010-06-17 NOTE — H&P (Signed)
April [name], [name]:  [id number]   MEDICAL RECORD NO.:  [id number]          PATIENT TYPE:  INP   LOCATION:  3009                         FACILITY:  [facility]   PHYSICIAN:  Jill Alexanders, M.D.  DATE OF BIRTH:  November 12, 1950   DATE OF ADMISSION:  07/04/2006  DATE OF DISCHARGE:                              HISTORY & PHYSICAL   HISTORY OF PRESENT ILLNESS:  April [name] is a 60 year old right-handed  black female born 12/27/1950, with a history of hypertension and  problems with diabetes and obesity.  This patient comes to [facility] emergency room for evaluation of problem with this severe  headache that has been present since the day prior to this evaluation.  Patient has a history of frequent headaches.  The patient has, however,  today noted onset of some left face, arm, and leg tingling sensations,  numbness that began around 8 p.m..  The patient got to the emergency  room within 15 minutes of the onset of the problem.  The patient has  felt poorly throughout the day with severe headache.  The patient denies  any visual disturbance, weakness of the arms and legs, problems with  balance and gait, but uses a cane and/or walker to ambulate due to  degenerative arthritis of the knees.  The patient has not had any  alteration in speech.  CT scan of the brain has been done and she  reveals questionable old lacunar infarction of the cerebellar  hemispheres, but otherwise was completely normal.  Neurology was asked  to see the patient for further evaluation.  The patient comes in as a  code stroke.   PAST MEDICAL HISTORY:  Significant for:  1. History of left hemisensory deficit as above.  2. Morbid obesity.  3. Obstructive sleep apnea.'  4. Hypertension.  5. Diabetes.  6. Arthroscopic knee surgery on the right.  7. Asthma.  8. Anemia.  9. Hysterectomy.  10.Renal calculi.  11.Shortness of breath with cardiac work-up that was negative for  significant coronary artery disease.  12.Dyslipidemia.  13.History of headache.  14.History degenerative arthritis.   MEDICATIONS:  1. Verapamil ER 360 mg daily.  2. Spironolactone 25 mg daily.  3. Potassium 20 mEq daily.  4. Vytorin 10/40 mg daily.  5. Clonidine 0.3 mg twice daily.  6. Topamax 200 mg one a day.  7. Hydralazine 10 mg three times daily.  8. Zyrtec 10 mg daily.  9. Proventil inhaler p.r.n.  10.Ecotrin 81 mg daily.  11.Darvocet if needed.  12.Multivitamins.   ALLERGIES:  1. SULFA DRUGS.  2. LOTENSIN.  3. DIOVAN.  4. DEMADEX.  5. FLAGYL.  6. PENICILLIN.  7. MAXZIDE.  8. METRONIDAZOLE.  9. BEESTINGS.  10.SHELLFISH.   HABITS:  The patient does not smoke or drink.   SOCIAL HISTORY:  Is notable that the patient is married, lives in the  [place] area.  Is on disability.  Not working.  Has  two children who are alive and well.   FAMILY HISTORY:  Her family medical history notable that both parents  died with cancer.  The patient was only child.   REVIEW OF SYSTEMS:  Notable for no recent fevers, chills.  The patient  denies any problems with vision.  Has had left-sided headache, soreness  to touch of the scalp.  Has no problems with swallowing.  Does note some  shortness of breath.  Denies chest pains.  Does note some left side  pain.  Has had  some left-sided face, arm and leg tingling that is new  in onset.  Denies any weakness.  Walks with a cane and walker.   PHYSICAL EXAMINATION:  VITAL SIGNS:  Blood pressure is 239/122, heart  rate 106, respiratory rate 20, temperature 99.  GENERAL:  The patient is a morbidly obese black female who was alert,  cooperative at the time of examination.  HEENT:  Head is atraumatic.  Pupils are equal, round and reactive to  light.  Disks are flat bilaterally.  NECK:  Supple.  No carotid bruits noted.  RESPIRATORY:  Clear.  CARDIOVASCULAR:  Distant heart sounds.  No obvious murmurs or rubs  noted.   ABDOMEN:  Quite obese.  Positive bowel sounds noted.  EXTREMITIES:  1-2+ edema at the ankles bilaterally.  NEUROLOGIC:  Cranial nerves as above.  Facial symmetry is present.  The  patient has good sensation of the face to pinprick, soft touch  bilaterally.  Good strength of the facial muscles, muscles of the head  turning, shoulder shrug bilaterally.  Speech is well-enunciated, not  aphasic.  The patient has good strength on both upper extremities.  Has  difficulty lifting both legs off the bed due to pain associated with hip  and knee arthritis.  No obvious difference in the strength is noted is  noted from one side to the next.  The patient has decreased pinprick  sensation on the left forearm and hand and left leg.  The right upper  sensation is more symmetric on both sides.  The patient has finger-nose-  finger bilaterally.  Is not really able to perform heel-to-shin well due  to problems with pain in the knees and hips, restriction of movement.  No obvious ataxia is noted.  The patient has mild drift to the left  upper extremity. Has drift to both lower extremities and majority due to  pain.  The patient has depressed but symmetric reflexes. Toes neutral  bilaterally.   NIH stroke scale score:  6.   LABORATORY DATA:  Laboratory values notable for creatinine 1.4, sodium  140, potassium 3.7, chloride of 108, BUN of 16, glucose of 110.  Hemoglobin  of 13.3, hematocrit 39.  The patient has had a CT scan.  Results as above.   EKG has been done and reveals normal sinus rhythm, inferior infarct, age  undetermined.  Heart rate is 65.   IMPRESSION:  1. New onset of left hemisensory deficit.  2. Hypertension.  3. Morbid obesity.  4. Obstructive sleep apnea.  5. Diabetes.   This patient has had significant hypertension upon admission to the ER.  May have a small vessel infarct involving the thalamus on the left body. Will admit for further workup at this point and to control blood   pressure.   PLAN:  1. Admission to [facility].  2. MRI of the brain.  3. MRI angiogram of intracranial and extracranial vessels.  4. 2-D echocardiogram.  5. Aspirin therapy for now.  6. Will follow blood pressure closely.   The patient was not felt to be a  TPA candidate due to minimal deficit.  Most the points from the NIH stroke scale score is from degenerative  arthritis, not from stroke symptoms.      Jill Alexanders, M.D.  Electronically Signed     CKW/MEDQ  D:  07/04/2006  T:  07/05/2006  Job:  [id number]   cc:   Darrick Penna. Linna Darner, MD,FACP,FCCP

## 2010-06-17 NOTE — Discharge Summary (Signed)
NAMEPEPSI, BONESTEEL NO.:  [id number]   MEDICAL RECORD NO.:  [id number]          PATIENT TYPE:  INP   LOCATION:  3009                         FACILITY:  [facility]   PHYSICIAN:  Shaune Pascal. Champey, M.D.DATE OF BIRTH:  [birth year]   DATE OF ADMISSION:  07/04/2006  DATE OF DISCHARGE:  07/06/2006                               DISCHARGE SUMMARY   DIAGNOSES AT TIME OF DISCHARGE:  1. Left body numbness and headache without definite stroke, etiology      likely primarily headache.  2. Dyslipidemia.  3. Hypertension.  4. Obesity.  5. Diabetes.  6. Obstructive sleep apnea.  7. Arthroscopic knee surgery on the right.  8. Asthma.  9. Anemia.  10.Hysterectomy.  11.Renal calculi.  12.Short of breath with cardiac workup that was negative for coronary      artery disease.  13.History of headache.  14.Degenerative arthritis.   MEDICINES AT TIME OF DISCHARGE:  1. Aspirin 325 mg a day.  2. Clonidine 0.2 mg q.8 h.  3. Vytorin one a day.  4. Hydralazine 15 mg q.8 h.  5. Metformin 1000 mg a day.  6. Multivitamin a day.  7. Potassium 20 mEq a day.  8. Spironolactone 25 mg a day.  9. Topiramate 100 mg q.h.s.  10.Verapamil 360 mg a day.  11.Zyrtec 10 mg a day.  12.Proventil inhaler as needed.   STUDIES PERFORMED:  1. CT of the brain on admission showed low density white matter lesion      in the cerebellar hemispheres bilaterally to reflect subacute or      old white matter infarcts.  No acute abnormalities.  2. MRI of the brain done at [facility] which was read as showing no acute      stroke per Dr. Leonie Man.  3. TCD is completed, results pending.  4. Carotid Doppler pending.  5. 2-D echocardiogram pending.  6. EKG shows normal sinus rhythm with first degree AV block;      otherwise, normal.   LABORATORY STUDIES:  Chemistry with glucose 109, creatinine 1.4;  otherwise, normal.  Liver function test normal.  Albumin 3.3.  Cardiac  enzymes negative.  Cholesterol 191,  triglycerides 63, HDL 35, LDL 143.  Urinalysis was negative.  Homocystine 11.  Urine drug screen negative.  Alcohol less than 5.  Hemoglobin A1c 6.4.  CBC with a hemoglobin 11.6,  RDW 14.6.  Differential normal.  Hypercoagulable panel negative.   HISTORY OF PRESENT ILLNESS:  [name] is a 60 year old right-  handed African-American female with history of hypertension, diabetes,  obesity, and headache.  She comes to [facility] for evaluation of  problems with severe headache that has been present since the day prior  to this evaluation.  She has a history of frequent headaches.  Today  though the patient noticed onset of some left face, arm, and leg  tingling sensation, numbness that began around 8 p.m.Marland Kitchen  She went to the  emergency room within 15 minutes of onset of problems.  She has felt  poorly throughout the day with severe headache.  She denied any visual  disturbance, weakness of the arms, legs, problems with balance or gait,  but uses a cane or walker to ambulate due to degenerative arthritis of  the knees.  She has not had any alteration in speech.  CT of the brain  has been done, and it reveals questionable old lacunar infarcts in the  cerebellum, but, otherwise, normal.  Neurology was asked to see.  She  was not a TPA candidate secondary to minimal deficits.  She was admitted  to the hospital for further stroke evaluation.   HOSPITAL COURSE:  The patient was greater than 300 pounds; therefore  would not fit in the [facility] MRI scanner.  She is sent by [facility] to  [facility] for evaluation there.  MRI, MRA of the brain was read by the  radiologist with results called to Dr. Leonie Man.  He reports that MRI  showed no acute abnormality, no acute stroke.  It was felt her left body  numbness and headache was all secondary to her headache as well as her  potential left-sided weakness.  She was seen by PT and OT and felt to  benefit from outpatient therapies so those things were  ordered at time  of discharge.  She does have multiple vascular risk factors;  dyslipidemia, hypertension, diabetes, obesity, and probable obstructive  sleep apnea.  During hospitalization her blood pressure ran high with  systolic blood pressure 123456 to [id number] and with diastolic blood pressure  consistently greater than 100.  Her hydralazine was increased to 15 mg  during hospitalization q.8 h.  The patient still pending 2-D  echocardiogram and carotid Doppler but once was done, she is stable for  discharge.   DISCHARGE PHYSICAL EXAMINATION:  HEENT:  The patient normocephalic,  atraumatic.  Extraocular movements are intact.  Her face is symmetric.  She has no aphasia.  No facial weakness.  NEUROLOGICAL:  She has good strength bilateral upper extremities;  bilateral proximal lower extremities are slightly weak.  She has  increased sensation on the left side to light touch.  HEART:  Her heart rate is regular.  LUNGS:  Breath sounds were clear.   DISCHARGE PLAN:  1. Discharge home with family.  2. Aspirin for stroke prevention.  3. Outpatient PT and OT to follow up weakness.  4. Follow up with primary care physician within one month for risk      factor control.  5. Follow up Dr. Leonie Man in two months.      Burnetta Sabin, N.P.      Shaune Pascal. Estella Husk, M.D.  Electronically Signed    SB/MEDQ  D:  07/06/2006  T:  07/06/2006  Job:  [id number]   cc:   Jill Alexanders, M.D.  Darrick Penna. Linna Darner, MD,FACP,FCCP

## 2010-06-17 NOTE — Assessment & Plan Note (Signed)
[facility] OFFICE NOTE   NIMSY, DROSS                       MRN:          [id number]  DATE:10/13/2007                            DOB:          August 14, 1950    PRIMARY CARE PHYSICIAN:  Darrick Penna. Linna Darner, MD, FACP, [facility]   INTERVAL HISTORY:  [name] is a very pleasant 60 year old woman with  history of morbid obesity, hypertension, hyperlipidemia, and mild-to-  moderate coronary artery disease.  She underwent catheterization in  February 2008, which showed 50-60% LAD lesion and 30% RCA lesion.  EF  was 70%.  She had mild pulmonary hypertension, primarily due to high  output state.  Her mean pulmonary artery pressure was 27, wedge pressure  was 10.  PVR was 1.80 Woods units.   She returns today for routine followup.  Overall, she is doing well.  She was referred for possible bariatric surgery, but unfortunately, her  insurance would not cover the classes that she needs to qualify.  She  has now just tried to do some walking on her own.  She denies any  further chest pain.  She does continue to have stable dyspnea on  exertion.  Blood pressure has been much better controlled.   CURRENT MEDICATIONS:  1. Carvedilol 25 b.i.d.  2. Hydralazine 25 b.i.d.  3. Aspirin 81 a day.  4. Potassium 20 a day.  5. Verapamil ER 360 a day.  6. Zyrtec.  7. Proventil inhaler.  8. Metformin 1000 a day.  9. Vytorin 10/40.   PHYSICAL EXAMINATION:  GENERAL:  She is markedly obese woman who walks  with a walker, in no acute distress.  VITAL SIGNS:  Respirations are unlabored.  Blood pressure is 136/78,  heart rate 70, weight is 403, which is up 14 pounds from previous.  HEENT:  Normal.  NECK:  Supple.  No obvious JVD, though it is hard to tell.  Carotids are  2+ bilaterally without bruits.  There is no lymphadenopathy or  thyromegaly.  CARDIAC:  PMI is not palpable.  She is regular with no obvious murmurs.  LUNGS:  Clear.  ABDOMEN:  Markedly obese.  Good bowel sounds.  Unable to examine for  hepatosplenomegaly.  There is no bruit appreciated.  EXTREMITIES:  Warm.  There is no cyanosis or clubbing.  There is trace  edema.  NEURO:  Alert and oriented x3.  Cranial nerves II-XII are intact.  Moves  all 4 extremities without difficulty.   ASSESSMENT AND PLAN:  1. Coronary artery disease.  This was mild-to-moderate.  No evidence      of ischemia.  Continue preventive measures.  2. Hypertension, much improved control.  3. Hyperlipidemia, as per Dr. Linna Darner.  Goal LDL is less than 70.  4. Obesity.  She is working with Dr. Linna Darner to find ways to lose      weight.  I have encouraged her with these efforts.   DISPOSITION:  We will see her back on a p.r.n. basis.     Shaune Pascal. Bensimhon, MD  Electronically Signed    DRB/MedQ  DD: 10/13/2007  DT: 10/13/2007  Job #: [id number]

## 2010-06-17 NOTE — Assessment & Plan Note (Signed)
[place] OFFICE NOTE   NAME:Tortora, CHISATO HATZIS                       MRN:          [id number]  DATE:12/20/2006                            DOB:          04/30/1950    PRIMARY CARE PHYSICIAN:  Dr. Linna Darner.   INTERVAL HISTORY:  Ms. Munzer is a 60 year old woman with a history of  hypertension, hyperlipidemia and morbid obesity.  She also has chronic  occasional chest pain.  She underwent cardiac catheterization less than  a year ago which showed nonobstructive coronary artery disease with a 50-  60% lesion in the LAD.  She returns today for routine followup.   Overall, she is doing fairly well.  She does have occasional brief chest  pains, but these resolve spontaneously.  She has chronic dyspnea.  She  has been going to water aerobics and trying to lose weight.  Unfortunately, she had a fall down the steps a few days ago and hurt her  right leg.  She saw Dr. Wynelle Link with an x-ray and there was no fracture.  She does have some mild lower extremity edema.   CURRENT MEDICATIONS:  1. Verapamil 360 a day.  2. Potassium 20 a day.  3. Clonidine 0.3 b.i.d.  4. Hydralazine 15 t.i.d.  5. Zyrtec p.r.n.  6. Proventil inhaler.  7. Aspirin 325 mg.  8. Multivitamin.  9. Metformin 1,000 a day.  10.Fish oil.  11.Vytorin 10/40.  12.Coreg 6.25 b.i.d.   PHYSICAL EXAMINATION:  GENERAL:  She is a morbidly obese woman, who  ambulates around the clinic slowly.  No acute respiratory problems.  VITAL SIGNS:  Blood pressure 174/94, pulse 77, weight 389.  HEENT:  Normal.  NECK:  Thick and supple.  It is not possible to evaluate her JVD.  Carotids are 2+ bilaterally without any bruits.  There is no  lymphadenopathy or thyromegaly.  CARDIAC:  She has distant heart sounds.  PMI is nonpalpable with no  obvious murmurs, rubs or gallops.  LUNGS:  Clear.  ABDOMEN:  Morbidly obese.  Unable to evaluate for organomegaly.  No  bruits or  masses appreciated.  Good bowel sounds.  EXTREMITIES:  Warm with no cyanosis or clubbing.  There is trace edema.  She does have a small hematoma on her right shin.  No rashes.  NEURO:  She is alert and oriented x three.  Cranial nerves II-XII are  intact.  Moves all four extremities without difficulty.   ASSESSMENT/PLAN:  1. Chest pain.  Given her results of her catheterization, this is      likely noncardiac.  Continue current therapy.  2. Hypertension.  Blood pressure remains poorly controlled.  Increase      Coreg to 12.5 b.i.d.  She was previously on spironolactone, but      this was stopped due to hyperkalemia.  It may be reasonable to try      at a lower dose.   FOLLOW UP:  We will see her back in 9 months for routine followup.  I  have tried to impress on her the need to  continue to lose weight.     Shaune Pascal. Bensimhon, MD  Electronically Signed    DRB/MedQ  DD: 12/20/2006  DT: 12/21/2006  Job #: [id number]

## 2010-06-20 NOTE — Assessment & Plan Note (Signed)
[place] OFFICE NOTE   NAME:Galbreath, ANNALYNN KABLE                       MRN:          [id number]  DATE:04/13/2006                            DOB:          1950/08/18    PRIMARY CARE PHYSICIAN:  Dr. Linna Darner.   INTERVAL HISTORY:  Ms. Dewaters is a 60 year old woman with a history of  morbid obesity, hypertension, hyperlipidemia.  We saw her recently for  chest pain and shortness of breath.  She had an abnormal Myoview.  She  subsequently underwent cardiac catheterization, which showed  nonobstructive coronary artery disease with the worst lesion being a 50-  60% lesion in the mid LAD.  She returns today for routine followup.  She  continues to complain of some mild chest tightness and significant  dyspnea.  Due to her weight, she has a very hard time ambulating.  This  morning, she underwent cortisone injections to both knees by Dr.  Wynelle Link.   CURRENT MEDICATIONS:  1. Verapamil 360 mg a day.  2. Potassium 20 mEq a day.  3. Vytorin 10/40.  4. Clonidine 0.3 b.i.d.  5. Hydralazine 10 t.i.d.  6. Zyrtec p.r.n.  7. Aspirin 81 a day.  8. Multivitamin.  9. Metformin 1000 mg a day.   PHYSICAL EXAM:  She is a morbidly obese woman who ambulates around the  clinic very slowly with a walker.  There is some mild dyspnea.  Blood pressure is 158/76 manually in the right arm, pulse is 82, weight  is 401 pounds, which is up 10 pounds from previous.  HEENT:  Sclerae anicteric, EOMI.  No xanthelasma.  Oropharynx is clear.  NECK:  Thick.  I am unable to assess JVD.  Carotids are 2+ bilaterally.  No bruits.  There is no lymphadenopathy or thyromegaly.  CARDIAC:  She has very distant heart sounds with a regular rate and  rhythm.  No obvious murmur.  LUNGS:  Clear.  ABDOMEN:  Morbidly obese.  Unable to appreciate for any organomegaly.  No bruits or masses appreciated.  Good bowel sounds.  EXTREMITIES:  Warm with no cyanosis or  clubbing.  There is trace edema.  No rashes.  NEURO:  She is alert and oriented x3.  Cranial nerves 2-12 are intact.  Moves all 4 extremities.   ASSESSMENT AND PLAN:  1. Chest pain.  Given the results of her catheterization, I suspect      this is not cardiac.  Given her risk factors, she needs very      aggressive risk factor modification, and I will leave this in the      hands of Dr. Linna Darner.  I will see her back in about 9 months to see      how she is doing.  2. Hypertension.  Blood pressure remains significantly elevated.  We      did take the liberty of adding Coreg 6.25 mg b.i.d.  She will      follow up with Dr. Linna Darner.  3. Hyperlipidemia.  Her Vytorin has been recently increased.  Will  continue to titrate to keep her LDL below 70.   DISPOSITION:  I had a long talk with her and her husband about the need  for her to lose weight and be more active.  As above, we will see her  back in 9 months for followup.     Shaune Pascal. Bensimhon, MD  Electronically Signed    DRB/MedQ  DD: 04/13/2006  DT: 04/15/2006  Job #: [id number]   cc:   Darrick Penna. Linna Darner, MD,FACP,FCCP

## 2010-06-20 NOTE — Op Note (Signed)
April [name], April [name]                ACCOUNT NO.:  [id number]   MEDICAL RECORD NO.:  [id number]          PATIENT TYPE:  AMB   LOCATION:  ENDO                         FACILITY:  [name]   PHYSICIAN:  Jeryl Columbia, M.D.    DATE OF BIRTH:  [birth year]   DATE OF PROCEDURE:  06/22/2005  DATE OF DISCHARGE:                                 OPERATIVE REPORT   PROCEDURE:  Colonoscopy with biopsy.   INDICATIONS:  Family history of colon cancer, due for colonic screening.  Consent was signed after risks, benefits, methods, and options thoroughly  discussed in the office on multiple occasions by both myself, my nurse and  Dr. Earlean Shawl.   MEDICINES USED:  Fentanyl 70 mcg, Versed 4 mg.   PROCEDURE:  Rectal inspection was pertinent for small external hemorrhoids.  Digital exam was negative.  The video colonoscope was inserted, easily  advanced to the midascending.  At that point there was some looping.  You  could see the ileocecal valve in the distance and with rolling her on her  back and some abdominal pressure was able to be advanced to the cecal pole.  No abnormalities were seen on insertion although there was lots of liquid  stool in the colon.  The scope was inserted a short way into the terminal  ileum, which was normal.  Photo documentation was obtained.  The scope was  slowly withdrawn.  With over a liter of washing and suctioning, probably  adequate visualization was obtained and on slow withdrawal through the  colon, other than a tiny distal descending polyp cold biopsied, no other  abnormalities were seen as we slowly withdrew back to her rectum.  Anorectal  pull-through and retroflexion confirmed some small hemorrhoids.  The scope  was straightened and readvanced a short way up the left side of the colon,  air was suctioned, the scope removed.  The patient tolerated the procedure  well.  There was no obvious immediate complication.   ENDOSCOPIC DIAGNOSES:  1.  Internal-external small  hemorrhoids.  2.  Tight distal descending polyp, cold biopsied.  3.  Otherwise within normal limits to the terminal ileum.   PLAN:  Await pathology, but probably recheck colon screening in five years,  either myself or Dr. Earlean Shawl happy to see back p.r.n., and return care to Dr.  Linna Darner for the customary health care screening and maintenance.           ______________________________  Jeryl Columbia, M.D.     MEM/MEDQ  D:  06/23/2005  T:  06/23/2005  Job:  [id number]   cc:   Mayme Genta, M.D.  Fax: [phone]   Darrick Penna. Linna Darner, M.D. [facility]  [phone]  [place] [address]

## 2010-06-20 NOTE — Assessment & Plan Note (Signed)
[facility]                             PULMONARY OFFICE NOTE   NAME:EARLEKashlee, Nevils                       MRN:          [id number]  DATE:05/03/2006                            DOB:          [birth year]    HISTORY OF PRESENT ILLNESS:  The patient is a 60 year old black female  who I have been asked to see for possible sleep apnea.  The patient is  morbidly obese and some of her history has raised concern about the  possibility of this disorder.  The patient states that she had been told  that she has loud snoring but no one has ever mentioned pauses in her  breathing during sleep.  She denies any choking arousals.  She does  admit to having restless sleep.  She typically goes to bed between 11  and 12 and gets up between 8 and 8:30 to start her day.  Half the time  she is unrested whenever she arises.  Patient states that she will doze  during the day with t.v. or reading and feels that her alertness is not  normal.  She takes at least two naps every single day.  The patient also  can doze as a passenger in a car.  Of note, the patient's weight is up  50 pounds from 2 years ago.   PAST MEDICAL HISTORY:  Significant for:  1. Hypertension.  2. Asthma.  3. Diabetes.  4. Dyslipidemia.  5. History of chronic headaches.  6. Status post hysterectomy.   CURRENT MEDICATIONS:  1. Verapamil ER 360 daily.  2. KCL 20 mEq daily.  3. Clonidine 0.3 mg b.i.d.  4. Hydralazine 10 mg t.i.d.  5. Zyrtec 10 mg p.r.n.  6. Proventil inhaler p.r.n.  7. Aspirin 81 mg daily.  8. Multivitamin daily.  9. Metformin 1000 mg daily.  10.Vytorin 10/40 daily.  11.Darvocet and hydrocodone p.r.n.  12.Various other over-the-counter supplements.   PATIENT IS INTOLERANT OR ALLERGIC TO:  1. FLAGYL.  2. SULFA.  3. PENICILLIN.  4. LOTENSIN.  5. MAXZIDE.  6. BEE STINGS.  7. SHELLFISH.  8. DIOVAN.  9. DEMADEX.   SOCIAL HISTORY:  She has never smoked.  She is married and has  children.   FAMILY HISTORY:  Remarkable for cancer in both her mother and father,  otherwise is noncontributory.   REVIEW OF SYSTEMS:  As per History of Present Illness, also see Patient  Intake Form documented in the chart.   PHYSICAL EXAM:  GENERAL:  She is a morbidly obese black female in no  acute distress.  Blood pressure is 144/90, pulse 69, temperature 98.7,  weight is at least 400 pounds by the patient's history.  The O2  saturation on room air is 97%.  HEENT:  Pupils equal, round, reactive to light and accommodation.  Extraocular muscles are intact.  Nares shows septal deviation to the  left.  OROPHARYNX:  Shows elongation of the soft palate and uvula.  NECK:  Supple without JVD or lymphadenopathy.  There is no palpable  thyromegaly.  CHEST:  Reveals decreased breath sounds throughout secondary  to small  tidal volume.  There is no wheezes.  CARDIAC:  Reveals regular rate and rhythm.  ABDOMEN:  Large but nontender with good bowel sounds.  GENITAL/RECTAL/BREAST:  Not done and not indicated.  LOWER EXTREMITIES:  Edema 2+ bilaterally.  Pulses are intact distally  but decreased secondary to edema.  NEUROLOGIC:  She is alert and oriented and moves all 4 extremities.   IMPRESSION:  Probable obstructive sleep apnea.  The patient gives a very  good history for this, has abnormal upper airway anatomy, and is  morbidly obese.  Certainly she has a lot of comorbid factors that would  make it critical that we diagnose whether she has sleep apnea or not.  I  think her history and exam is suspicious enough to proceed with  nocturnal polysomnography.   PLAN:  1. Consider for nocturnal polysomnogram.  2. Work on weight loss.  3. The patient will follow up after the above.     Kathee Delton, MD,FCCP  Electronically Signed    [facility]/MedQ  DD: 05/03/2006  DT: 05/03/2006  Job #: [id number]   cc:   Shaune Pascal. Bensimhon, MD  Darrick Penna. Linna Darner, MD,FACP,FCCP

## 2010-06-20 NOTE — Assessment & Plan Note (Signed)
[place] OFFICE NOTE   NAME:EARLEKateryn, Nighswonger                       MRN:          [id number]  DATE:03/16/2006                            DOB:          07-Jun-1950    Anaya was seen March 16, 2006, for followup of her metabolic  syndrome and diabetes.  She states that her fasting blood sugars are 98  to 133.   From June 2007 to January 2008 her A1c has risen from 6.1 to 6.4.  At  present, this means her sugars are averaging 149 and her risk of a  premature heart attack or stroke based on her diabetes status is 28%.  Her creatinine has risen from 1.3 to 1.5 during that time.   Active symptoms include a headache which she describes as a pressure on  the left eye.  She has seen Dr. Janyth Contes, who stated that her  ophthalmologic exam was negative.  She is taking no medication and  simply meditates.   She also describes left-sided throbbing, nonexertional chest pain which  does not radiate and lasts seconds.  It tends to occur almost every  other day.  It may be associated with sweating but not nausea.   She previously had an abnormal dobutamine stress cardiogram with no EKG  changes in 2002; cath revealed nonobstructive disease.   Myocardial infarction was present in both paternal uncles and aunts.   PHYSICAL EXAMINATION:  She is 5 feet 4 inches, weighs 392 pounds.  Blood  pressure was 110/72, respiratory rate 18 and pulse 82.  THYROID:  Normal to palpation.  CHEST:  Clear.  She has a grade 1 systolic murmur with neck radiation.  She has slight lymphedema.  The abdominal aorta could not be palpated due to her body habitus.  She walks with difficulty and gets on the table only with help.  She is  employing a walker for ambulation.   EKG was normal.   Her CBC diff revealed a hematocrit of 34.9.  This is unchanged from  April 2007.  Dating to 2006 she has had hematocrits at this level.  She  has been  followed by Dr. Earlie Raveling with colonoscopies.   Lipids revealed an LDL of 136 and HDL of 37.  Normal/ negative studies  include a CPK and MB, free T4, TSH, SGOT and SGPT.  Troponin level was  upper limits of normal at 0.06.   A  repeat nuclear stress test is planned as well as Cardiology  consultation.   Lillyian is an extremely nice, intelligent person who has not addressed  her morbid obesity and metabolic syndrome.  Each time I attempt to  broach the subject, she will divert the topic .   Unfortunately, the only option may be a gastric bypass surgery if  cardiology feels this is not contraindicated because of the multiplicity  of risk factors.   Because of elevated creatinine and blood pressure of 110/70,  spironolactone was discontinued.  Forced oral hydration was recommended.     Darrick Penna. Linna Darner, MD,FACP,FCCP  Electronically Signed    [facility]/MedQ  DD:  03/19/2006  DT: 03/19/2006  Job #: [id number]

## 2010-06-20 NOTE — Cardiovascular Report (Signed)
[place]. [facility]  Patient:    April [name], April [name]                       MRN: [id number] Proc. Date: 07/29/00 Adm. Date:  [id number] Disc. Date: [id number] Attending:  Ernestine Mcmurray                        Cardiac Catheterization  DATE OF BIRTH:  [birth year]  REFERRING PHYSICIAN:  Darrick Penna. Linna Darner, M.D.  CARDIOLOGIST:  Terald Sleeper, M.D.  PROCEDURES PERFORMED: 1. Left heart catheterization with selective coronary angiography. 2. Ventriculography.  DIAGNOSES: 1. Nonobstructive coronary artery disease. 2. Normal left ventricular systolic function. 3. Markedly elevated left ventricular end-diastolic pressure.  HISTORY:  The patient is a 59 year old female, morbidly obese, with a prior history of Redux and Meridia use.  The patient was seen because of substernal chest pain.  A stress echocardiographic study was eventually done which showed inferior hypokinesis.  The patient was then referred for a diagnostic catheterization to assess her coronary anatomy.  DESCRIPTION OF PROCEDURE:  After informed consent was obtained, the patient was brought to the catheterization laboratory.  A 6 French arterial sheath was placed in the right femoral artery using the modified Seldinger technique. The 6 [place] and JR4 catheters were used respectively to engage the left and right coronary systems.  Selective coronary angiography was performed in various projections using manual injection of contrast.  Ventriculography was performed with a 6 French angled pigtail catheter.  Appropriate left-sided hemodynamics were also obtained.  At the termination of the case, all catheters and sheath were removed and no complications were noted.  FINDINGS:  HEMODYNAMICS:  Left ventricular pressure 184/95 mmHg, aortic pressure 184/27 mmHg.  VENTRICULOGRAPHY:  Ejection fraction 60% with no segmental wall motion abnormalities.  SELECTIVE CORONARY ANGIOGRAPHY: 1. Left main coronary  is a large caliber vessel with no evidence of    flow-limiting coronary artery disease. 2. Left anterior descending artery is a large caliber vessel with a diffuse    30% stenosis in the mid vessel. 3. Left circumflex coronary artery has an ostial 40-50% diffuse narrowing. 4. Right coronary artery has a diffuse stenosis of approximately 30%.  RECOMMENDATIONS:  Results have been discussed with the patient.  There is no evidence of flow-limiting epicardial coronary artery disease, although there is mild plaquing noted as outlined above.  The patient would benefit from further risk factor modification with use of an ACE inhibitor and statin drug therapy. DD:  09/13/00 TD:  09/13/00 Job: [id number]

## 2010-06-20 NOTE — Discharge Summary (Signed)
[place]. [facility]  Patient:    April [name], April [name]                       MRN: [id number] Adm. Date:  [id number] Disc. Date: 07/31/00 Attending:  Ernestine Mcmurray Dictator:   Arn Medal, P.A. CC:         Darrick Penna. Linna Darner, M.D. [facility]   Discharge Summary  DISCHARGE DIAGNOSES:  1. Hypertension.  2. Hyperlipidemia.  3. Tobacco abuse.  4. Obesity.  5. Asthma.  6. Nephrolithiasis.  HOSPITAL COURSE:  Mrs. April [name] is a 60 year old female with known coronary artery disease.  In April of this year, she had had a 2-D echocardiogram performed for dyspnea on exertion.  This showed some slight inferior hypokinesis.  A dobutamine echocardiogram showed some ischemia at the base of the posterior wall; as a result, patient was referred for inpatient cardiac catheterization. Patient was taken to the cath lab by Dr. Terald Sleeper.  Catheterization ______: Left main coronary artery:  Normal.  Left anterior descending artery artery: Diffuse 30% in the mid-vessel.  Left circumflex:  Ostial 40-50%.  Right coronary artery:  Mid-vessel 30% diffuse.  Ejection fraction approximately 65%.  Dr. Dannielle Burn felt that the patient had nonobstructive coronary artery disease; however, he noticed that her left ventricular end-diastolic pressure was elevated at 37.  The next day, the patient reported some dizziness and mild tenderness at the cath site.  A D-dimer was ordered which came back positive at 2.46 and subsequently a spiral CT of the chest was ordered; however, in light of the fact that the patient has a known allergy to shellfish, this was changed to a V/Q scan which was negative for PE.  The next morning, patient was seen by Dr. Champ Mungo. Lovena Le.  She had no chest pain or shortness of breath, however, she did report some dizziness.  The right groin was stable without bleeding or hematoma.  As a result, Dr. Lovena Le felt that the patient was stable for discharge.  DISCHARGE MEDICATIONS:  1.  Verapamil 240 mg q.d.  2. Clonidine 0.1 mg t.i.d.  3. Vioxx 25 mg b.i.d.  4. Neurontin 800 mg t.i.d.  5. Topamax 150 mg t.i.d.  6. Zyrtec 10 mg q.d.  7. Enteric-coated aspirin 325 mg q.d.  8. Glucosamine as previously taken.  9. Proventil inhaler as needed. 10. Zocor 20 mg q.p.m.  DISCHARGE INSTRUCTIONS:  Patient was advised to avoid driving, sexual activity or heavy lifting for 48 hours.  DIET:  She was advised to follow a 2000-calorie 60 g fat, of which 10 g saturated fat, daily.  Of note, dietary consult was obtained before discharge and patient was referred to outpatient dietary counseling.  SPECIAL DISCHARGE INSTRUCTIONS:  She is to call the [place] office for any bleeding, pain or swelling at the cath site.  FOLLOWUP:  She is to follow up with a P.A. visit in the office on Wednesday, July 10th, at 3 p.m.  She is to follow up with Dr. Darrick Penna. Hopper as needed or as scheduled.  LABORATORY VALUES:  Sodium 140, potassium 3.8, chloride 110, CO2 23, BUN 25, creatinine 1.3, glucose 139.  WBC 7.2, hemoglobin 11.1, hematocrit 33.9, platelets of 255,000.  Total cholesterol was 230, triglycerides of 61, HDL of 51 and LDL of 167. DD:  07/31/00 TD:  07/31/00 Job: [id number]

## 2010-06-20 NOTE — Assessment & Plan Note (Signed)
[place] OFFICE NOTE   SENETRA, MANFRE                       MRN:          [id number]  DATE:03/26/2006                            DOB:          [birth year]    REFERRING PHYSICIAN:  Darrick Penna. Linna Darner, MD,FACP,FCCP   REASON FOR CONSULTATION:  Chest pain and abnormal stress test.   HISTORY OF PRESENT ILLNESS:  April [name] is a 60 year old woman with  multiple medical problems, including morbid obesity, diabetes,  hypertension, and hyperlipidemia.  She has a history of atypical chest  pain and underwent cardiac catheterization in 2002, which reportedly  showed nonobstructive coronary artery disease, though I do not have the  report.  She is very limited in her ability to get around and walks  around with a walker.  She relates this mostly to a knee surgery she had  in 2000, she states she has bone on bone.  However, she does try to do  her activities of daily living.  Approximately a month ago, she had an  episode of sharp what she calls acute chest pain.  This lasted for about  5 minutes.  She was somewhat short of breath and it resolved on its own.  Since that time, she has had a paroxysm of sharp, stabbing pain.  This  usually lasts a couple of minutes and will go away.  It can come on at  any time and is not necessarily precipitated by exertion or emotional  stress.  She underwent a nuclear study in the hospital yesterday.  It  was a 2-day study.  It showed a normal ejection fraction of 69%.  However there is a question of a moderately sized defect on the anterior  wall, as well as a defect in the inferior wall, concerning for ischemia.  She has also been complaining about pain in her right lower extremity  with any movement.   REVIEW OF SYSTEMS:  Positive for chronic dyspnea as well as lower  extremity edema, orthopnea, heavy snoring.  Denies any bright red blood  per rectum or melena.  She has severe  arthritis pain and does take pain  medications on occasion.  She also occasionally has palpitations and  feels dizzy.  She has not had syncope.  The remainder of the review of  systems is negative, except for HPI and problem list.   PROBLEM LIST:  1. Atypical chest pain.      a.     Cardiac catheterization in 2002 with nonobstructive coronary       artery disease.      b.     Adenosine Myoview yesterday, ejection fraction of 69%,       question anterior and inferior ischemia.  2. Morbid obesity at 400 pounds.  3. Hypertension.  4. Hyperlipidemia.  5. Severe osteoarthritis.   CURRENT MEDICATIONS:  1. Verapamil 360 a day.  2. Potassium 20 a day.  3. Vytorin 10/20.  4. Clonidine 0.3 b.i.d.  5. Hydralazine 10 mg t.i.d.  6. Zyrtec 10 mg p.r.n.  7. Proventil inhaler.  8. Aspirin  81.  9. Multivitamin.  10.Metformin 1000 a day.  11.Fish oil.  12.Flax seed oil.  13.Vitamin C.  14.She also takes pain medication p.r.n.   ALLERGIES:  SULFA, PENICILLIN, LOTENSIN, MAXZIDE, DIOVAN, DEMODEX,  FLAGYL, BEE STING, SHELLFISH.   SOCIAL HISTORY:  She lives in [place] with her husband.  She denies  any tobacco or alcohol use.  She is a former Oncologist.   FAMILY HISTORY:  Negative for premature coronary artery disease.   PHYSICAL EXAM:  She is a morbidly obese woman who has difficulty  ambulating around the clinic due to her size.  There is no respiratory  distress.  Blood pressure is 142/74 with a large cuff.  Pulse 70, weight 393  pounds.  HEENT:  Sclerae anicteric.  EOMI.  There is no xanthelasma.  Mucous  membranes are moist.  Oropharynx clear.  NECK:  Thick.  I did not adequately assess her JVD, but appears flat.  Carotids are 2+ bilaterally without any bruits.  There is no  lymphadenopathy or thyromegaly appreciated.  CARDIAC:  She has distant heart sounds.  Regular rate and rhythm with no  obvious murmurs.  LUNGS:  Clear.  ABDOMEN:  Obese.  Unable to palpate for any  appreciable  hepatosplenomegaly.  No bruits.  Good bowel sounds.  No masses  appreciated.  EXTREMITIES:  Warm.  There is no cyanosis or clubbing.  There is 2+  edema bilaterally.  She is very tender to palpation on the right calf.  Unable to appreciate a cord.  There are good pulses.  NEURO:  She is alert and oriented x3.  Cranial nerves 2-12 are intact.  Moves all 4 extremities without difficulty.  Affect is mildly flattened.   EKG:  Sinus rhythm rate of 70 with normal axis and intervals.  There is  no ST-T wave abnormality.   ASSESSMENT AND PLAN:  1. Chest pain with an abnormal Myoview.  Given her size, it is hard to      know exactly what to make of her Myoview.  Her chest pain certainly      sounds atypical, but she does have multiple cardiac risk factors.      Spent nearly half an hour discussing the risks and benefits of      medical therapy versus cardiac catheterization with both her and      her husband.  At this point, she would like to proceed with      catheterization.  Given her size, I told her that she is at      increased risk for complications for catheterization.  However,      they would still like to proceed.  We will set her up for right and      left heart catheterization in the outpatient laboratory next week.      She will need peripheral access for her contrast allergy.  2. Hyperlipidemia.  Her LDL was elevated at 136.  Given her diabetes,      this will need to be under 70.  Will increase her Vytorin to 10/40.  3. Hypertension, as per Dr. Linna Darner.  4. Obesity.  I did once again of the absolute need to try and lose      weight given the impact on her health.     Shaune Pascal. Bensimhon, MD  Electronically Signed    DRB/MedQ  DD: 03/26/2006  DT: 03/26/2006  Job #: [id number]   cc:   Darrick Penna. Linna Darner, MD,FACP,FCCP

## 2010-06-20 NOTE — Cardiovascular Report (Signed)
NAMEJENNINGS, April [name] NO.:  [id number]   MEDICAL RECORD NO.:  [id number]          PATIENT TYPE:  OIB   LOCATION:  1963                         FACILITY:  [facility]   PHYSICIAN:  Shaune Pascal. Bensimhon, MDDATE OF BIRTH:  [birth year]   DATE OF PROCEDURE:  03/30/2006  DATE OF DISCHARGE:  01/29/2006                            CARDIAC CATHETERIZATION   PRIMARY CARE PHYSICIAN:  Darrick Penna. Linna Darner, MD,FACP,FCCP   PATIENT IDENTIFICATION:  April [name] is a 60 year old woman with multiple  cardiac risk factors including morbid obesity, hypertension and  hyperlipidemia.  She presented with complaints of chest pain and  shortness of breath.  She underwent a Myoview by Dr. Linna Darner which showed  question of some anterior and inferior ischemia.  However, there was  question about artifact due to her size; she is about 400 pounds.  I saw  her clinic, and she did complain of significant chest pain and shortness  of breath.  We had a long talk about the risk of catheterization and how  they are augmented in someone her size.  This was discussed with her and  her husband, and they decided to proceed with catheterization.  This was  performed in the outpatient catheterization lab.   PROCEDURES PERFORMED:  1. Right heart catheterization.  2. Left heart catheterization.  3. Selective coronary angiography.  4. Abdominal aortogram to evaluate for renal artery stenosis.  5. AngioSeal femoral closure device.   DESCRIPTION OF PROCEDURE:  Prior to the catheterization, April [name]'s  blood pressure was consistently in the 220 range.  She was treated with  IV hydralazine which brought her down to the 170-190 range.  On arrival  to lab, she was again in the 200s and received some labetalol.  Once  again the risks and benefits of catheterization were explained.  Consent  was on the chart.  Right groin was anesthetized with 1% local lidocaine,  and a 5-French arterial sheath was placed in the right  femoral artery  using a modified Seldinger technique.  A 7-French venous sheath was also  placed in the right femoral vein using a similar technique.  The  catheters were inserted easily through a frontal wall puncture on the  first attempt.  Standard catheters were used for left heart  catheterization including JL-4, JR-4 and angled pigtail.  Standard Swan-  Ganz catheter was used for the right heart catheterization.  All  catheter exchanges made over wire.  There are no apparent complications.   FINDINGS:   HEMODYNAMIC RESULTS:  Right atrial mean of 3, RV pressure 44/4, PA  pressure 40/16 with a mean of 0.7.  Pulmonary capillary wedge pressure  mean of 10.  Central aortic pressure 191/88 with a mean of 130.  LV  pressure 161/11 with EDP of 18.  Fick cardiac output was 9.5 liters per  minute with index of 3.6 liters per minute per meter squared.  There was  no aortic stenosis or mitral stenosis.   Pulmonary vascular resistance was 1.8 Woods units.  Saturations mid SVC  was 72%, right atrial saturation was 69%, RV saturation 70%.  PA  saturation 74%.  Aortic saturation 94%.   CORONARY ANATOMY:  Left main normal.   LAD was a long vessel coursing to the apex, gave off two diagonals.  There was a 50-60% stenosis in the LAD just after the first diagonal and  a 40% stenosis in the mid LAD just after the second diagonal.   The left circumflex was a moderate-size system.  It gave off a small  ramus and a large branching OM-1.  It is angiographically normal.   Right coronary artery was a dominant vessel that gave off a large RV  branch. There was a dual PDA system with one small and one moderate-size  vessel and too small posterolaterals.  There was a 30% stenosis in the  mid RCA and 30% stenosis in the proximal portion of the larger PDA  branches.   Left ventriculogram done in the RAO position showed an EF of 70% with no  wall motion abnormalities or mitral regurgitation.   Abdominal  aortogram showed patent renal arteries bilaterally with no  evidence of abdominal aortic aneurysm.   ASSESSMENT:  1. Mild to moderate nonobstructive coronary artery disease as      discussed above.  2. Mild for arterial hypertension.  3. Normal left ventricular function.  4. Normal renal arteries.  5. Severe hypertension.  6. High output state without evidence of intracardiac shunt.   PLAN AND DISCUSSION:  I suspect April [name]'s symptoms are related to her  obesity.  Her blood pressure is also likely playing a role.  I doubt  that her coronary artery disease is responsible for her symptoms.  At  this point, I would recommend aggressive medical therapy to treat her  risk factors as well as weight loss if at all possible.      Shaune Pascal. Bensimhon, MD  Electronically Signed     DRB/MEDQ  D:  03/30/2006  T:  03/30/2006  Job:  [id number]   cc:   Darrick Penna. Linna Darner, MD,FACP,FCCP

## 2010-07-02 ENCOUNTER — Other Ambulatory Visit: Payer: Self-pay | Admitting: Gastroenterology

## 2010-07-02 ENCOUNTER — Ambulatory Visit (HOSPITAL_COMMUNITY)
Admission: RE | Admit: 2010-07-02 | Discharge: 2010-07-02 | Disposition: A | Discharge status: Home / Self Care | Payer: Medicare Other | Source: Ambulatory Visit | Attending: Gastroenterology | Admitting: Gastroenterology

## 2010-07-02 DIAGNOSIS — K648 Other hemorrhoids: Secondary | ICD-10-CM | POA: Insufficient documentation

## 2010-07-02 DIAGNOSIS — Z800 Family history of malignant neoplasm of digestive organs: Secondary | ICD-10-CM | POA: Insufficient documentation

## 2010-07-02 DIAGNOSIS — D126 Benign neoplasm of colon, unspecified: Secondary | ICD-10-CM | POA: Insufficient documentation

## 2010-07-02 DIAGNOSIS — E119 Type 2 diabetes mellitus without complications: Secondary | ICD-10-CM | POA: Insufficient documentation

## 2010-07-02 DIAGNOSIS — K644 Residual hemorrhoidal skin tags: Secondary | ICD-10-CM | POA: Insufficient documentation

## 2010-07-02 LAB — GLUCOSE, CAPILLARY: Glucose-Capillary: 109 mg/dL — ABNORMAL HIGH (ref 70–99)

## 2010-07-30 ENCOUNTER — Other Ambulatory Visit: Payer: Self-pay | Admitting: Internal Medicine

## 2010-08-11 ENCOUNTER — Ambulatory Visit: Payer: Medicare Other | Admitting: Internal Medicine

## 2010-08-11 ENCOUNTER — Ambulatory Visit (INDEPENDENT_AMBULATORY_CARE_PROVIDER_SITE_OTHER)
Admission: RE | Admit: 2010-08-11 | Discharge: 2010-08-11 | Disposition: A | Discharge status: Home / Self Care | Payer: Medicare Other | Source: Ambulatory Visit | Attending: Family Medicine | Admitting: Family Medicine

## 2010-08-11 ENCOUNTER — Ambulatory Visit (INDEPENDENT_AMBULATORY_CARE_PROVIDER_SITE_OTHER): Payer: Medicare Other | Admitting: Family Medicine

## 2010-08-11 ENCOUNTER — Encounter: Payer: Self-pay | Admitting: Family Medicine

## 2010-08-11 VITALS — BP 130/84 | Temp 99.1°F | Wt >= 6400 oz

## 2010-08-11 DIAGNOSIS — R05 Cough: Secondary | ICD-10-CM

## 2010-08-11 DIAGNOSIS — R059 Cough, unspecified: Secondary | ICD-10-CM

## 2010-08-11 MED ORDER — AZITHROMYCIN 250 MG PO TABS: 250.0000 mg | ORAL_TABLET | Freq: Every day | ORAL | Status: AC

## 2010-08-11 MED ORDER — ALBUTEROL SULFATE HFA 108 (90 BASE) MCG/ACT IN AERS: 2.0000 | INHALATION_SPRAY | RESPIRATORY_TRACT | Status: DC | PRN

## 2010-08-11 MED ORDER — GUAIFENESIN-CODEINE 100-10 MG/5ML PO SYRP: 10.0000 mL | ORAL_SOLUTION | Freq: Three times a day (TID) | ORAL | Status: DC | PRN

## 2010-08-11 NOTE — Patient Instructions (Signed)
Go to [facility] (the [facility] building across from [facility]) for your chest xray Start the Azithromycin as directed Use the cough syrup as needed Tylenol/ibuprofen for fever Drink plenty of fluids Use the Albuterol inhaler every 4 hrs as needed REST! If your breathing worsens, please call or go to the ER Hang in there!

## 2010-08-11 NOTE — Progress Notes (Signed)
  Subjective:    Patient ID: April [name], female    DOB: [birth year], 60 y.o.   MRN: [id number]  HPI Cough- sxs started 2 weeks ago.  Cough is productive of yellow sputum.  Subjective fevers.  + sore throat, HA, ear pain.  + wheezing (hx of asthma).  + sick contacts.  Using Symbicort and albuterol w/out relief.  Review of Systems For ROS see HPI     Objective:   Physical Exam  Vitals reviewed. Constitutional:       Morbidly obese  HENT:  Head: Normocephalic and atraumatic.  Mouth/Throat: No oropharyngeal exudate.       No TTP over sinuses Edematous turbinates  Eyes: Conjunctivae and EOM are normal. Pupils are equal, round, and reactive to light.  Neck: Neck supple.  Cardiovascular:       Distant S1/S2  Pulmonary/Chest: She has wheezes.       + hacking, wet cough Diffuse scattered wheezes  Lymphadenopathy:    She has no cervical adenopathy.          Assessment & Plan:

## 2010-08-14 ENCOUNTER — Other Ambulatory Visit: Payer: Self-pay | Admitting: Internal Medicine

## 2010-08-16 NOTE — Op Note (Signed)
  April [name], REITNAUER NO.:  [id number]  MEDICAL RECORD NO.:  [id number]           PATIENT TYPE:  O  LOCATION:  [facility]                         FACILITY:  [facility]  PHYSICIAN:  Jeryl Columbia, M.D.    DATE OF BIRTH:  1950-11-23  DATE OF PROCEDURE:  07/02/2010 DATE OF DISCHARGE:                              OPERATIVE REPORT   PROCEDURE:  Colonoscopy with biopsy.  INDICATION:  The patient with family history of colon cancer, personal history of colon polyps.  Consent was signed after risks, benefits, methods, options thoroughly discussed multiple times in the past.  MEDICINES USED:  Fentanyl 100 mcg, 6 mg of Versed.  PROCEDURE:  Rectal inspection is pertinent for small external hemorrhoids.  Digital exam was negative.  The video colonoscope was inserted and easily advanced around the colon to the cecum.  No significant abnormalities were seen on insertion.  The cecum was identified by the appendiceal orifice and the ileocecal valve.  The prep was adequate.  There was some liquid stool that required washing and suctioning.  The scope was slowly withdrawn.  The cecum, ascending and majority of the descending were normal and the more distal descending two tiny polyps were seen and were cold biopsied and put in the first container.  The scope was further withdrawn and in the proximal descending, another tiny polyp was seen and was cold biopsied as well and put in the same container.  No other abnormalities were seen as we slowly withdrew back to the rectum.  Once back in the rectum, anorectal pull-through and retroflexion confirmed some small hemorrhoids.  Scope was straightened and readvanced towards the left side of the colon.  Air was suctioned, scope removed.  The patient tolerated the procedure. There was no obvious immediate complication.  ENDOSCOPIC DIAGNOSES: 1. Internal-external small hemorrhoids. 2. Three tiny proximal descending and distal transverse polyps  cold     biopsied. 3. Otherwise within normal limits to the cecum.  PLAN:  Await pathology, but probably repeat colon screening in 5 years. Happy to see back p.r.n.          ______________________________ Jeryl Columbia, M.D.     MEM/MEDQ  D:  07/02/2010  T:  07/02/2010  Job:  [id number]  cc:   Darrick Penna. Linna Darner, MD,FACP,FCCP  Electronically Signed by Clarene Essex M.D. on 08/16/2010 03:00:22 PM

## 2010-08-24 DIAGNOSIS — R059 Cough, unspecified: Secondary | ICD-10-CM | POA: Insufficient documentation

## 2010-08-24 DIAGNOSIS — R05 Cough: Secondary | ICD-10-CM | POA: Insufficient documentation

## 2010-08-24 NOTE — Assessment & Plan Note (Signed)
Given pt's underlying lung disease will start abx.  Will get CXR to r/o edema or other pulmonary process.  Encouraged regular use of albuterol as pt has been limiting her use of this.  Reviewed supportive care and red flags that should prompt return.  Pt expressed understanding and is in agreement w/ plan.

## 2010-09-05 ENCOUNTER — Ambulatory Visit (INDEPENDENT_AMBULATORY_CARE_PROVIDER_SITE_OTHER): Payer: Medicare Other | Admitting: Internal Medicine

## 2010-09-05 ENCOUNTER — Telehealth: Payer: Self-pay | Admitting: Internal Medicine

## 2010-09-05 DIAGNOSIS — I10 Essential (primary) hypertension: Secondary | ICD-10-CM

## 2010-09-05 DIAGNOSIS — R5381 Other malaise: Secondary | ICD-10-CM

## 2010-09-05 DIAGNOSIS — I2510 Atherosclerotic heart disease of native coronary artery without angina pectoris: Secondary | ICD-10-CM

## 2010-09-05 DIAGNOSIS — R609 Edema, unspecified: Secondary | ICD-10-CM

## 2010-09-05 DIAGNOSIS — J40 Bronchitis, not specified as acute or chronic: Secondary | ICD-10-CM

## 2010-09-05 LAB — BASIC METABOLIC PANEL
CO2: 27 mEq/L (ref 19–32)
Chloride: 111 mEq/L (ref 96–112)
Creatinine, Ser: 1.1 mg/dL (ref 0.4–1.2)
Glucose, Bld: 99 mg/dL (ref 70–99)
Sodium: 146 mEq/L — ABNORMAL HIGH (ref 135–145)

## 2010-09-05 LAB — CBC WITH DIFFERENTIAL/PLATELET
Basophils Relative: 0.9 % (ref 0.0–3.0)
Eosinophils Absolute: 0.1 10*3/uL (ref 0.0–0.7)
Hemoglobin: 11.9 g/dL — ABNORMAL LOW (ref 12.0–15.0)
Lymphs Abs: 1.6 10*3/uL (ref 0.7–4.0)
MCHC: 32.8 g/dL (ref 30.0–36.0)
MCV: 88.4 fl (ref 78.0–100.0)
Monocytes Absolute: 0.4 10*3/uL (ref 0.1–1.0)
Neutro Abs: 3.4 10*3/uL (ref 1.4–7.7)
RBC: 4.1 Mil/uL (ref 3.87–5.11)
RDW: 15 % — ABNORMAL HIGH (ref 11.5–14.6)

## 2010-09-05 MED ORDER — SPIRONOLACTONE 25 MG PO TABS
25.0000 mg | ORAL_TABLET | Freq: Two times a day (BID) | ORAL | Status: DC
Start: 2010-09-05 — End: 2011-01-27

## 2010-09-05 MED ORDER — LEVOFLOXACIN 500 MG PO TABS: 500.0000 mg | ORAL_TABLET | Freq: Every day | ORAL | Status: AC

## 2010-09-05 NOTE — Telephone Encounter (Signed)
Per Dr. Huey Bienenstock pt needs to be see for edema & Coranary atherosclerosis. Pt last seen 2008. Dr. Linna Hoff has no schedule left pls call and advise notes in EPIC

## 2010-09-05 NOTE — Progress Notes (Signed)
  Subjective:    Patient ID: April [name], female    DOB: 24-Sep-1950, 60 y.o.   MRN: [id number]  HPI  Her cardiologist, Dr. Haroldine Laws prescribed spironolactone in December 2011. She continued this until February of 2012 but ran out. She noted that there was some question of "kidney infection" on this medicine & she did renew it. She had no rash fever or other allergic reactions taking the spironolactone. She questions whether she should be taking spironolactone along with furosemide for her edema. Her prior lab results were reviewed; renal function has been normal.  The edema has persisted despite the furosemide.  She continues to have a productive cough with yellow sputum despite a course of Zithromax. She also describes being "cold all the time". The office visit of 7/9 with Dr. Birdie Riddle was reviewed.The major and minor symptoms of rhinosinusitis were reviewed. She denies nasal congestion/obstruction; nasal purulence; facial pain; anosmia; fatigue; headache;halitosis; earache or dental pain.  She does describe sweats and fatigue.        Review of Systems      Objective:   Physical Exam General appearance:in  no acute distress or increased work of breathing is present.  No  lymphadenopathy about the head, neck, or axilla noted.   Eyes: No conjunctival inflammation or lid edema is present.   Ears:  External ear exam shows no significant lesions or deformities.  Otoscopic examination reveals clear canals, tympanic membranes are intact bilaterally without bulging, retraction, inflammation or discharge.  Nose:  External nasal examination shows no deformity or inflammation. Nasal mucosa are pink but dry without lesions or exudates. No septal dislocation or dislocation.No obstruction to airflow.   Oral exam: Dental hygiene is good; lips and gums are healthy appearing.There is no oropharyngeal erythema or exudate noted.   Neck:  No deformities, thyromegaly, masses, or tenderness noted.    Supple with full range of motion without pain.   Heart:  Normal rate and regular rhythm. S1 and S2 normal without gallop,  click, rub or other extra sounds.  Grade 1 systolic murmur  Lungs:Chest clear to auscultation; no wheezes, rhonchi,rales ,or rubs present.No increased work of breathing.    Extremities:  No cyanosis, or clubbing  noted . Lipedema & 1+ edema.   Skin: Warm & dry w/o jaundice or tenting.          Assessment & Plan:  #1 edema; her cardiologist feels spironolactone as needed to address the coronary artery disease and its associated issues  #2 bronchitis, status post Zithromax course  #3 fatigue  #4 intolerance to  Sulfa; itching with PCN  Plan: See orders and recommendations.

## 2010-09-05 NOTE — Telephone Encounter (Signed)
[facility] referral coordinator with DR. Huey Bienenstock is referring pt to be seen by Dr. Haroldine Laws. Debroah Baller is aware that Md is seen pt's in the office one day a week, and there are no openings on his schedule.  I will send this message to Kevan Rosebush RN to see it pt can be seen here in the office or in Dr. Haroldine Laws  clinic the hospital, or be seen by another MD in this office. Debroah Baller would like to be call at [phone] ext [phone].

## 2010-09-05 NOTE — Patient Instructions (Signed)
Avoid ingestion of  excess salt/sodium.Cook with pepper & other spices . Use the salt substitute "No Salt"(unless your potassium has been elevated) OR the Mrs Deliah Boston products to season food @ the table. Avoid foods which taste salty or "vinegary" as their sodium contentet will be high. Blood Pressure Goal  Ideally is an AVERAGE < 135/85. This AVERAGE should be calculated from @ least 5-7 BP readings taken @ different times of day on different days of week. You should not respond to isolated BP readings , but rather the AVERAGE for that week .  Please hold the furosemide for the next 4 weeks while you're on the spironolactone twice a day. A followup visit should be scheduled with Dr. Haroldine Laws after being on spironolactone for at least 4 weeks.

## 2010-09-08 ENCOUNTER — Telehealth: Payer: Self-pay | Admitting: Internal Medicine

## 2010-09-08 NOTE — Telephone Encounter (Signed)
Per centricity patient has always taken meds 1 po qd I advised it could have been a error when prescribed. I advise she can take it during the day as initially prescribed by Dr.Hopper, she stated she took it at bedtime the weekend and did not like how it made her feel and agreed to restarting during the day. Not have any issues right now and she didn't have any specific side effects. Just didn't like how she felt per patient. I advise if any concerns to call us asap--she agreed.    KP

## 2010-09-15 ENCOUNTER — Other Ambulatory Visit: Payer: Self-pay | Admitting: Internal Medicine

## 2010-09-16 NOTE — Telephone Encounter (Signed)
appt sch 8/15

## 2010-09-16 NOTE — Telephone Encounter (Signed)
Lipid 272.4

## 2010-09-17 ENCOUNTER — Ambulatory Visit (INDEPENDENT_AMBULATORY_CARE_PROVIDER_SITE_OTHER): Payer: Medicare Other | Admitting: Internal Medicine

## 2010-09-17 ENCOUNTER — Encounter: Payer: Self-pay | Admitting: Internal Medicine

## 2010-09-17 VITALS — BP 142/78 | HR 78 | Ht 64.0 in | Wt >= 6400 oz

## 2010-09-17 DIAGNOSIS — R0609 Other forms of dyspnea: Secondary | ICD-10-CM

## 2010-09-17 DIAGNOSIS — G4733 Obstructive sleep apnea (adult) (pediatric): Secondary | ICD-10-CM | POA: Insufficient documentation

## 2010-09-17 DIAGNOSIS — R0602 Shortness of breath: Secondary | ICD-10-CM

## 2010-09-17 DIAGNOSIS — R0683 Snoring: Secondary | ICD-10-CM

## 2010-09-17 DIAGNOSIS — R609 Edema, unspecified: Secondary | ICD-10-CM

## 2010-09-17 LAB — BASIC METABOLIC PANEL
BUN: 24 mg/dL — ABNORMAL HIGH (ref 6–23)
CO2: 27 mEq/L (ref 19–32)
Chloride: 112 mEq/L (ref 96–112)
Glucose, Bld: 105 mg/dL — ABNORMAL HIGH (ref 70–99)
Potassium: 4.3 mEq/L (ref 3.5–5.1)

## 2010-09-17 NOTE — Progress Notes (Signed)
HPI:  April [name] is a very pleasant 60 year old woman with history of morbid obesity (BMI 70), DM2, hypertension, hyperlipidemia, and mild-to- moderate coronary artery disease.  April [name] underwent catheterization in February 2008, which showed 50-60% LAD lesion and 30% RCA lesion.  EF was 70%.  April [name] had mild pulmonary hypertension, primarily due to high output state.  Her mean pulmonary artery pressure was 27, wedge pressure was 10.  PVR was 1.80 Woods units.  Referred back by Dr. Linna Darner due to persistent LE edema. Saw Dr. Linna Darner with worsening LE edema. Spironolactone restarted. (lasix held). Weight down about 12 pounds. Says edema better but still swell. Worse at night. Feet sting and burn. Tender to touch.  Feels exhausted all the time. We discussed testing for OSA previously but April [name] refused.  Mobility very limited by body habitus able to do ADLs but not moving much outside of that. SOB with any activity. Snores heavily.   ROS: All systems negative except as listed in HPI, PMH and Problem List.  Past Medical History  Diagnosis Date  . Meralgia paresthetica     Dr. Doy Mince  . CTS (carpal tunnel syndrome)   . CAD (coronary artery disease)   . Osteoarthrosis, unspecified whether generalized or localized, lower leg   . Diabetes mellitus   . Hypertension   . Hyperlipidemia   . Dermatitis   . Morbid obesity   . PUD (peptic ulcer disease)     Current Outpatient Prescriptions  Medication Sig Dispense Refill  . albuterol (PROVENTIL HFA) 108 (90 BASE) MCG/ACT inhaler Inhale 2 puffs into the lungs every 4 (four) hours as needed for wheezing or shortness of breath.  1 Inhaler  3  . amLODipine (NORVASC) 5 MG tablet Take 5 mg by mouth daily. If BP not < 140/90 with increased BP meds       . aspirin (ECOTRIN LOW STRENGTH) 81 MG EC tablet Take 81 mg by mouth daily.        . budesonide-formoterol (SYMBICORT) 160-4.5 MCG/ACT inhaler Inhale 2 puffs into the lungs 2 (two) times daily. Gargle after use  1  Inhaler  11  . carvedilol (COREG) 25 MG tablet TAKE 1 TABLET BY MOUTH EVERY MORNING AND 1/2 TABLET EVERY EVENING  90 tablet  1  . Flaxseed, Linseed, (FLAX SEED OIL) 1000 MG CAPS Take by mouth 3 (three) times daily.        Marland Kitchen glucose blood (ONE TOUCH ULTRA TEST) test strip 1 each by Other route daily. Use as instructed       . guaiFENesin-codeine (ROBITUSSIN AC) 100-10 MG/5ML syrup Take 10 mLs by mouth 3 (three) times daily as needed for cough.  240 mL  0  . hydrALAZINE (APRESOLINE) 25 MG tablet take 1 tablet by mouth twice a day  60 tablet  7  . Lancets (ONETOUCH ULTRASOFT) lancets 1 each by Other route as directed. Use as instructed       . Multiple Vitamin (MULTIVITAMIN) tablet Take 1 tablet by mouth daily.        . Omega-3 Fatty Acids (FISH OIL) 1000 MG CAPS Take by mouth daily.        . potassium chloride SA (K-DUR,KLOR-CON) 20 MEQ tablet Take 20 mEq by mouth daily.        . simvastatin (ZOCOR) 40 MG tablet take 1 tablet by mouth at bedtime  30 tablet  0  . spironolactone (ALDACTONE) 25 MG tablet Take 1 tablet (25 mg total) by mouth 2 (two) times daily.  [place]  tablet  1  . topiramate (TOPAMAX) 50 MG tablet Take 50 mg by mouth 2 (two) times daily.        . verapamil (VERELAN PM) 360 MG 24 hr capsule Take 360 mg by mouth at bedtime.        . furosemide (LASIX) 40 MG tablet TAKE ONE-HALF TO ONE TABLET BY MOUTH EVERY DAY AS NEEDED FOR EDEMA  90 tablet  1  . oxyCODONE-acetaminophen (PERCOCET) 5-325 MG per tablet Take 1 tablet by mouth every 6 (six) hours as needed.           PHYSICAL EXAM: Filed Vitals:   09/17/10 0936  BP: 142/78  Pulse: 78   General:   April [name] is a morbidly obese woman who ambulates around the clinic very slowly with a walker.  +dyspnea HEENT:  Normal NECK:  Thick.  I am unable to assess JVD.  Carotids are 2+ bilaterally. No bruits.  CARDIAC:  April [name] has very distant heart sounds with a regular rate and rhythm.  2/6 systolic murmur RUSB. LUNGS:  Clear. ABDOMEN:  Morbidly obese.   Unable to appreciate for any organomegaly. No bruits or masses appreciated.  Good bowel sounds. EXTREMITIES:  Warm with no cyanosis or clubbing.  There is 1+ edema. L > R  No rashes. NEURO:  April [name] is alert and oriented x3.  Cranial nerves 2-12 are intact. Moves all 4 extremities.   ECG: NSR 61 No ST-T wave abnormalities.     ASSESSMENT & PLAN:

## 2010-09-17 NOTE — Assessment & Plan Note (Signed)
Volume status much improved on spironolactone. Will stop potassium. Check labs today. Can use lasix prn for extra volume. Recheck echo to assess EF and pulmonary pressures. Emphasized need to consider sleep study and treat probable severe OSA. Also appears to have component of neuropathy and may benefit from Neurontin or Lyrica.

## 2010-09-17 NOTE — Assessment & Plan Note (Signed)
See above

## 2010-09-17 NOTE — Patient Instructions (Signed)
Stop Potassium Continue Spironolactone  Labs today  Your physician has requested that you have an echocardiogram. Echocardiography is a painless test that uses sound waves to create images of your heart. It provides your doctor with information about the size and shape of your heart and how well your heart's chambers and valves are working. This procedure takes approximately one hour. There are no restrictions for this procedure.  Your physician wants you to follow-up in: 6 months. You will receive a reminder letter in the mail two months in advance. If you don't receive a letter, please call our office to schedule the follow-up appointment.

## 2010-09-18 ENCOUNTER — Encounter: Payer: Self-pay | Admitting: *Deleted

## 2010-09-18 ENCOUNTER — Telehealth: Payer: Self-pay | Admitting: Internal Medicine

## 2010-09-18 NOTE — Telephone Encounter (Signed)
Pt called says she was seen by Dr. Haroldine Laws and was told that he would get in touch w/ Dr. Linna Darner in regards to medication suggestions for neuropathy. Informed pt that I spoke w/ Dr. Linna Darner and had indicated that he hadn't received any information from Dr. Haroldine Laws and also let him know what this was in regards to. Informed pt that Dr. Linna Darner recommended that since she has a neurologist who treats her neuropathy that he will defer discussion to them. Let pt know that she will need to contact cardiology office to let them know so they could fax correspondence to neurologist office for recommendations. Pt ok'd information.

## 2010-09-22 ENCOUNTER — Telehealth: Payer: Self-pay | Admitting: Internal Medicine

## 2010-09-22 ENCOUNTER — Ambulatory Visit (HOSPITAL_COMMUNITY): Payer: Medicare Other | Attending: Internal Medicine | Admitting: Radiology

## 2010-09-22 DIAGNOSIS — E119 Type 2 diabetes mellitus without complications: Secondary | ICD-10-CM | POA: Insufficient documentation

## 2010-09-22 DIAGNOSIS — G629 Polyneuropathy, unspecified: Secondary | ICD-10-CM

## 2010-09-22 DIAGNOSIS — R609 Edema, unspecified: Secondary | ICD-10-CM

## 2010-09-22 DIAGNOSIS — I2510 Atherosclerotic heart disease of native coronary artery without angina pectoris: Secondary | ICD-10-CM | POA: Insufficient documentation

## 2010-09-22 DIAGNOSIS — R0609 Other forms of dyspnea: Secondary | ICD-10-CM | POA: Insufficient documentation

## 2010-09-22 DIAGNOSIS — E785 Hyperlipidemia, unspecified: Secondary | ICD-10-CM | POA: Insufficient documentation

## 2010-09-22 DIAGNOSIS — I079 Rheumatic tricuspid valve disease, unspecified: Secondary | ICD-10-CM | POA: Insufficient documentation

## 2010-09-22 DIAGNOSIS — I10 Essential (primary) hypertension: Secondary | ICD-10-CM | POA: Insufficient documentation

## 2010-09-22 DIAGNOSIS — R0989 Other specified symptoms and signs involving the circulatory and respiratory systems: Secondary | ICD-10-CM | POA: Insufficient documentation

## 2010-09-22 NOTE — Telephone Encounter (Signed)
pt was in last week and dr bensimhon suggested two different meds and sent dr Huey Bienenstock an email re this, however dr hopper said neurologist needed to prescribe, but hasn't seen her in a while and needs dr bensimhon to call over another referral, dr Marcial Pacas [phone]

## 2010-09-22 NOTE — Telephone Encounter (Signed)
Pt states Dr [employer] office told her to call us to refer to neuro, she states she called neuro to get appt but they said since it is a new problem she has to have a referral, order placed

## 2010-09-29 ENCOUNTER — Telehealth: Payer: Self-pay | Admitting: Internal Medicine

## 2010-09-29 NOTE — Telephone Encounter (Signed)
Pt given results  

## 2010-09-29 NOTE — Telephone Encounter (Signed)
Pt calling re blood work results and echo.

## 2010-10-17 ENCOUNTER — Other Ambulatory Visit: Payer: Self-pay | Admitting: Internal Medicine

## 2010-10-21 NOTE — Telephone Encounter (Signed)
LIPID/HEP [id number]  

## 2010-11-15 ENCOUNTER — Other Ambulatory Visit: Payer: Self-pay | Admitting: Internal Medicine

## 2010-11-20 LAB — I-STAT 8, (EC8 V) (CONVERTED LAB)
Acid-Base Excess: 1
Bicarbonate: 23.7
Glucose, Bld: 110 — ABNORMAL HIGH
Hemoglobin: 13.3
Operator id: 277751
Sodium: 140
TCO2: 25

## 2010-11-20 LAB — POCT I-STAT CREATININE
Creatinine, Ser: 1.4 — ABNORMAL HIGH
Operator id: 277751

## 2010-11-20 LAB — LIPID PANEL
LDL Cholesterol: 143 — ABNORMAL HIGH
Triglycerides: 63

## 2010-11-20 LAB — URINALYSIS, ROUTINE W REFLEX MICROSCOPIC
Bilirubin Urine: NEGATIVE
Ketones, ur: NEGATIVE
Nitrite: NEGATIVE
Urobilinogen, UA: 0.2

## 2010-11-20 LAB — TROPONIN I: Troponin I: 0.02

## 2010-11-20 LAB — DIFFERENTIAL
Eosinophils Absolute: 0.1
Eosinophils Relative: 1
Monocytes Absolute: 0.5
Neutro Abs: 3.8

## 2010-11-20 LAB — CK TOTAL AND CKMB (NOT AT ARMC)
Relative Index: INVALID
Total CK: 57

## 2010-11-20 LAB — COMPREHENSIVE METABOLIC PANEL
Alkaline Phosphatase: 64
BUN: 17
Chloride: 108
Glucose, Bld: 109 — ABNORMAL HIGH
Potassium: 3.7
Total Bilirubin: 0.4

## 2010-11-20 LAB — ETHANOL: Alcohol, Ethyl (B): <5

## 2010-11-20 LAB — CBC
Hemoglobin: 11.6 — ABNORMAL LOW
MCHC: 32.8
RDW: 14.6 — ABNORMAL HIGH

## 2010-11-20 LAB — POCT CARDIAC MARKERS
Operator id: 277751
Troponin i, poc: <0.05

## 2010-11-20 LAB — PROTIME-INR
INR: 1
Prothrombin Time: 12.8

## 2010-11-20 LAB — HEMOGLOBIN A1C: Hgb A1c MFr Bld: 6.4 — ABNORMAL HIGH

## 2010-11-20 LAB — RAPID URINE DRUG SCREEN, HOSP PERFORMED
Cocaine: NOT DETECTED
Tetrahydrocannabinol: NOT DETECTED

## 2010-11-20 LAB — HOMOCYSTEINE: Homocysteine: 11

## 2010-12-03 ENCOUNTER — Ambulatory Visit (INDEPENDENT_AMBULATORY_CARE_PROVIDER_SITE_OTHER): Payer: Medicare Other

## 2010-12-03 DIAGNOSIS — Z23 Encounter for immunization: Secondary | ICD-10-CM

## 2011-01-17 ENCOUNTER — Other Ambulatory Visit: Payer: Self-pay | Admitting: Internal Medicine

## 2011-01-19 NOTE — Telephone Encounter (Signed)
LIPID/HEP [id number]  

## 2011-01-20 ENCOUNTER — Other Ambulatory Visit: Payer: Self-pay | Admitting: Internal Medicine

## 2011-01-23 ENCOUNTER — Encounter: Payer: Self-pay | Admitting: Internal Medicine

## 2011-01-23 ENCOUNTER — Ambulatory Visit (INDEPENDENT_AMBULATORY_CARE_PROVIDER_SITE_OTHER): Payer: Medicare Other | Admitting: Internal Medicine

## 2011-01-23 DIAGNOSIS — R509 Fever, unspecified: Secondary | ICD-10-CM

## 2011-01-23 DIAGNOSIS — M899 Disorder of bone, unspecified: Secondary | ICD-10-CM

## 2011-01-23 DIAGNOSIS — M2550 Pain in unspecified joint: Secondary | ICD-10-CM

## 2011-01-23 DIAGNOSIS — I10 Essential (primary) hypertension: Secondary | ICD-10-CM

## 2011-01-23 DIAGNOSIS — R252 Cramp and spasm: Secondary | ICD-10-CM

## 2011-01-23 LAB — CBC WITH DIFFERENTIAL/PLATELET
Basophils Absolute: 0.1 10*3/uL (ref 0.0–0.1)
Eosinophils Absolute: 0.1 10*3/uL (ref 0.0–0.7)
Hemoglobin: 12.1 g/dL (ref 12.0–15.0)
Lymphocytes Relative: 20.3 % (ref 12.0–46.0)
Lymphs Abs: 1.3 10*3/uL (ref 0.7–4.0)
MCHC: 32.6 g/dL (ref 30.0–36.0)
MCV: 89.7 fl (ref 78.0–100.0)
Monocytes Absolute: 0.4 10*3/uL (ref 0.1–1.0)
Neutro Abs: 4.4 10*3/uL (ref 1.4–7.7)
RDW: 14.9 % — ABNORMAL HIGH (ref 11.5–14.6)

## 2011-01-23 LAB — BASIC METABOLIC PANEL
CO2: 30 mEq/L (ref 19–32)
Calcium: 8.9 mg/dL (ref 8.4–10.5)
Chloride: 107 mEq/L (ref 96–112)
Glucose, Bld: 105 mg/dL — ABNORMAL HIGH (ref 70–99)
Sodium: 145 mEq/L (ref 135–145)

## 2011-01-23 NOTE — Patient Instructions (Signed)
Please perform isometric exercises before going to bed; magcal may help the muscle symptoms as we discussed.

## 2011-01-23 NOTE — Progress Notes (Signed)
  Subjective:    Patient ID: April [name], female    DOB: 05-07-1950, 60 y.o.   MRN: [id number]  HPI Extremity pain Location:both lower extremities in feet, calves & shins Onset:1 week ago Trigger/injury:no Pain quality:sharp & cramping Pain severity: up to 12 Duration:minutes Radiation:? L posterior thigh last night Exacerbating factors: no Treatment/response:ice packs help some Review of systems: Constitutional: no fever but chills & sweats  Musculoskeletal: no  Associated  joint stiffness, redness, or swelling Skin:no rash, color change Neuro: no weakness, but numbness and tingling in toes Heme:no lymphadenopathy; abnormal bruising or bleeding       Review of Systems     Objective:   Physical Exam  she is in no acute distress  She has no lymphadenopathy about the neck or axilla.  Breath sounds are decreased but there is no increased work of breathing, rales, rhonchi, wheezes  An S4 is noted without significant murmurs or gallops  Abdomen reveals no organomegaly, tenderness, or masses. No aortic aneurysm or renal artery bruits are present.  Vascular: All pulses were intact but there is decrease in the pedal pulses due to the edema.    She has trace edema to mid shin. There is tenderness to deep palpation of these areas. Homans sign is negative bilaterally, but she has pain in the left foot with ankle flexion.            Assessment & Plan:   #1 arthralgias versus cramps in feet  #2 myalgias  # 3 chills and sweats without fever  Plan: See orders and recommendations.

## 2011-01-23 NOTE — Assessment & Plan Note (Signed)
Her blood pressure averages 140/70-80 at home. She takes furosemide as needed only, not regularly. She is on Spironolactone  Daily. She was previously on potassium but not at this time. Renal function and electrolytes will be checked.

## 2011-01-24 LAB — VITAMIN D 25 HYDROXY (VIT D DEFICIENCY, FRACTURES): Vit D, 25-Hydroxy: 42 ng/mL (ref 30–89)

## 2011-01-26 ENCOUNTER — Telehealth: Payer: Self-pay | Admitting: *Deleted

## 2011-01-26 NOTE — Telephone Encounter (Signed)
Pt called still c/o cramping in leg. Pt notes that she did try the exercise but did not help with the cramping. Pt indicated that at [facility] there were some OTC meds discuss but she is unable to recall what they were. Pt would also like to get lab results..Please advise

## 2011-01-27 ENCOUNTER — Other Ambulatory Visit: Payer: Self-pay | Admitting: Internal Medicine

## 2011-01-28 ENCOUNTER — Other Ambulatory Visit: Payer: Self-pay | Admitting: Internal Medicine

## 2011-01-28 DIAGNOSIS — T887XXA Unspecified adverse effect of drug or medicament, initial encounter: Secondary | ICD-10-CM

## 2011-01-28 DIAGNOSIS — E785 Hyperlipidemia, unspecified: Secondary | ICD-10-CM

## 2011-01-28 NOTE — Telephone Encounter (Signed)
Per VO Dr Linna Darner, this has been addressed & answered by him.

## 2011-01-28 NOTE — Telephone Encounter (Signed)
The labs were normal except for minimally elevated sedimentation rate which is not significant. If her symptoms are not better with magnesium/calcium supplement and isometric exercises; she should see the neurologist to verify that there is no neurologic component. Vitamin D level was low normal; she should not be having muscle pains from  Vit D deficiency at this level.

## 2011-01-29 ENCOUNTER — Other Ambulatory Visit (INDEPENDENT_AMBULATORY_CARE_PROVIDER_SITE_OTHER): Payer: Medicare Other

## 2011-01-29 DIAGNOSIS — E785 Hyperlipidemia, unspecified: Secondary | ICD-10-CM

## 2011-01-29 DIAGNOSIS — T887XXA Unspecified adverse effect of drug or medicament, initial encounter: Secondary | ICD-10-CM

## 2011-01-29 LAB — LIPID PANEL
HDL: 43.1 mg/dL (ref >39.00)
LDL Cholesterol: 82 mg/dL (ref 0–99)
Total CHOL/HDL Ratio: 3
Triglycerides: 46 mg/dL (ref 0.0–149.0)
VLDL: 9.2 mg/dL (ref 0.0–40.0)

## 2011-01-29 LAB — HEPATIC FUNCTION PANEL
Albumin: 3.5 g/dL (ref 3.5–5.2)
Total Bilirubin: 0.4 mg/dL (ref 0.3–1.2)

## 2011-01-29 NOTE — Progress Notes (Signed)
LABS ONLY  

## 2011-01-29 NOTE — Telephone Encounter (Addendum)
Patient stopped by the office, she had not heard back about her message. Phone note printed and given to patient, patient stated she would like to go forward with Neurology referral, leg cramps cause patient not to sleep. Patient has a pending appointment with Dr. [name] on 02/19/2010, patient would like for [place] to call and see if an earlier appointment can be given (unable to be seen on 02/08/10)   I spoke with [facility] (referral coordinator), she will contact there office.

## 2011-02-04 ENCOUNTER — Encounter: Payer: Self-pay | Admitting: Internal Medicine

## 2011-02-04 ENCOUNTER — Ambulatory Visit (INDEPENDENT_AMBULATORY_CARE_PROVIDER_SITE_OTHER): Payer: Medicare Other | Admitting: Internal Medicine

## 2011-02-04 VITALS — BP 130/70 | HR 89 | Temp 98.5°F | Wt 398.0 lb

## 2011-02-04 DIAGNOSIS — I10 Essential (primary) hypertension: Secondary | ICD-10-CM

## 2011-02-04 DIAGNOSIS — R05 Cough: Secondary | ICD-10-CM

## 2011-02-04 DIAGNOSIS — R52 Pain, unspecified: Secondary | ICD-10-CM

## 2011-02-04 DIAGNOSIS — J45901 Unspecified asthma with (acute) exacerbation: Secondary | ICD-10-CM

## 2011-02-04 DIAGNOSIS — R6883 Chills (without fever): Secondary | ICD-10-CM

## 2011-02-04 LAB — POCT INFLUENZA A/B
Influenza A, POC: NEGATIVE
Influenza B, POC: NEGATIVE

## 2011-02-04 MED ORDER — ALBUTEROL SULFATE (2.5 MG/3ML) 0.083% IN NEBU
2.5000 mg | INHALATION_SOLUTION | Freq: Once | RESPIRATORY_TRACT | Status: AC
  Administered 2011-02-04: 2.5 mg via RESPIRATORY_TRACT

## 2011-02-04 MED ORDER — PREDNISONE 10 MG PO TABS: ORAL_TABLET | ORAL | Status: DC

## 2011-02-04 MED ORDER — AZITHROMYCIN 250 MG PO TABS: ORAL_TABLET | ORAL | Status: AC

## 2011-02-04 NOTE — Assessment & Plan Note (Signed)
Of note is that she is taking Aldactone (which is in her allergies list) without any problems.

## 2011-02-04 NOTE — Assessment & Plan Note (Addendum)
Seen acutely for respiratory symptoms for one week. As soon as she arrived to the office, her O2 sat was 86, repeated O2 sat was 93, she got a breathing treatment and her O2 sat was 94. The patient was reexamined after the albuterol nebulization, the rhonchi resolved, cough decreased but she was still wheezing. Flu test negative Plan: Steroids, antibiotics and Mucinex Continue symbicort Albuterol every 4 hours for 2 days then every 6 as needed. ER if symptoms severe. See  Instructions Today , I spent more than 25 min with the patient

## 2011-02-04 NOTE — Progress Notes (Signed)
  Subjective:    Patient ID: April [name], female    DOB: 1950-05-20, 61 y.o.   MRN: [id number]  HPI 61 year old lady with history of asthma, diabetes on diet control, not a smoker who presents with one-week history of increased cough, scratchy throat, decreased appetite, subjective fever, occasional chills. She reports good compliance with Symbicort twice a day and usually uses albuterol twice a day. Since the onset of the symptoms she is requiring more albuterol.   Past Medical History  Diagnosis Date  . Meralgia paresthetica     Dr. Doy Mince  . CTS (carpal tunnel syndrome)   . CAD (coronary artery disease)   . Osteoarthrosis, unspecified whether generalized or localized, lower leg   . Diabetes mellitus   . Hypertension   . Hyperlipidemia   . Dermatitis   . Morbid obesity   . PUD (peptic ulcer disease)       Review of Systems Occasionally shortness of breath since the onset of symptoms. Occasional right-sided chest pain usually with cough.. CBGs usually twice a day to 120     Objective:   Physical Exam  Constitutional: She is oriented to person, place, and time.       Morbidly obese lady, nor respiratory distress but she has audible wheezing and coughing frequently.  HENT:       Nose congested, throat without redness, tympanic membranes bilaterally slightly bulged but not read. Sinuses not tender to palpation  Cardiovascular: Normal rate and normal heart sounds.   No murmur heard. Pulmonary/Chest:       audible wheezing, no increased work of breathing, bilateral rhonchi and wheezing noted. No crackles.  Neurological: She is alert and oriented to person, place, and time.  Psychiatric: She has a normal mood and affect. Her behavior is normal. Judgment and thought content normal.          Assessment & Plan:

## 2011-02-04 NOTE — Patient Instructions (Signed)
Rest, fluids , tylenol Mucinex DM twice a day as needed cough and congestion  for wheezing and cough, use ventolin-albuterol 2 puffs every 4 hours today and tomorrow, after that you can take 2 puffs every 6 hours as needed. Take the antibiotic as prescribed ----> Zithromax Take prednisone as prescribed, prednisone may increase the blood sugar,  Check your sugars twice a day, if they are more than 200, let us know. Call if no better in few days Call anytime if the symptoms are severe, you have high fever, short of breath

## 2011-02-05 ENCOUNTER — Telehealth: Payer: Self-pay | Admitting: *Deleted

## 2011-02-05 MED ORDER — HYDROCODONE-HOMATROPINE 5-1.5 MG/5ML PO SYRP: 5.0000 mL | ORAL_SOLUTION | Freq: Four times a day (QID) | ORAL | Status: AC | PRN

## 2011-02-05 NOTE — Telephone Encounter (Signed)
I entered a prescription for Hycodan to be used as needed for cough. Would make her sleepy, can not mix these with her regular pain medication, Percocet JP

## 2011-02-05 NOTE — Telephone Encounter (Signed)
Pt called to report that she is still having difficulty with sleeping due to cough and asthma flare up. Pt would like to get a cough med Rx.Please advise

## 2011-02-05 NOTE — Telephone Encounter (Signed)
Discuss with patient, Rx sent. 

## 2011-02-06 ENCOUNTER — Other Ambulatory Visit: Payer: Self-pay | Admitting: Internal Medicine

## 2011-02-06 DIAGNOSIS — Z1231 Encounter for screening mammogram for malignant neoplasm of breast: Secondary | ICD-10-CM

## 2011-02-09 ENCOUNTER — Encounter (HOSPITAL_COMMUNITY): Payer: Medicare Other

## 2011-02-17 ENCOUNTER — Telehealth (HOSPITAL_COMMUNITY): Payer: Self-pay | Admitting: *Deleted

## 2011-02-17 NOTE — Telephone Encounter (Signed)
April [name] called this afternoon, she is having a reaction to the spironolactone, she says that she has had a reaction to this medication in the past and that you gave her a pill that subsided it.  She is also has a cold and is taking cold medicine.   She told her primary dr, but they wanted our office to handle this reaction since Dr Haroldine Laws is the ordering physician. She would like a call back.  Thanks!

## 2011-02-18 NOTE — Telephone Encounter (Signed)
Spoke w/pt she states that she broke out in a hive and started itching on Sat and it has continued, she is taking benadryl with not much relief she feels it is from her Arlyce Harman which she has been on since the summer, recently she was on azithromycin for 6 days which she finished around 1/8 and prednisone which she finished around 1/12 she is also on Hycodan for a cough, she states she has had this in the past with no problem, will discuss w/pharmacist and call her back

## 2011-02-18 NOTE — Telephone Encounter (Signed)
Discussed with Leone Haven, PA she doesn't feel probably from April [name], spoke w/pt she now states that she stopped taking the April [name] on Sunday and the hives seem to be clearing up and she hasn't been itching much today, she will continue to hold April [name] until appt on Friday

## 2011-02-20 ENCOUNTER — Ambulatory Visit (HOSPITAL_COMMUNITY)
Admission: RE | Admit: 2011-02-20 | Discharge: 2011-02-20 | Disposition: A | Discharge status: Home / Self Care | Payer: Medicare Other | Source: Ambulatory Visit | Attending: Internal Medicine | Admitting: Internal Medicine

## 2011-02-20 DIAGNOSIS — I509 Heart failure, unspecified: Secondary | ICD-10-CM

## 2011-02-20 DIAGNOSIS — I5033 Acute on chronic diastolic (congestive) heart failure: Secondary | ICD-10-CM | POA: Insufficient documentation

## 2011-02-20 DIAGNOSIS — I5032 Chronic diastolic (congestive) heart failure: Secondary | ICD-10-CM | POA: Insufficient documentation

## 2011-02-20 DIAGNOSIS — I2510 Atherosclerotic heart disease of native coronary artery without angina pectoris: Secondary | ICD-10-CM | POA: Insufficient documentation

## 2011-02-20 NOTE — Progress Notes (Signed)
PCP: Dr. Linna Darner  HPI:  [name] is a very pleasant 61 year old woman with history of morbid obesity (BMI 70), DM2, hypertension, hyperlipidemia, and mild-to- moderate coronary artery disease.  She underwent catheterization in February 2008, which showed 50-60% LAD lesion and 30% RCA lesion.  EF was 70%.  She had mild pulmonary hypertension, primarily due to high output state.  Her mean pulmonary artery pressure was 27, wedge pressure was 10.  PVR was 1.80 Woods units.  Returns for f/u today.  Main complaint is itching/rash.  She was treated for an asthma exacerbation with prednisone and a z-pak on 1/2.  Started to have the rash on her upper extremities, chest and back around the 15th or so.  She had a problem with spiro in the past and felt this may be the cause of the rash so she has stopped taking her spiro and her rash has improved.  During this time she also started using new soap which she has also discontinued.  She has good days and bad.  Her breathing has improved since completing her regimen but still with occasional cough.  She is having problems with leg cramps.  She can get around the house ok.  Dyspneic with exertion.  No orthopnea/PND.  She has not undergone OSA testing.     ROS: All systems negative except as listed in HPI, PMH and Problem List.  Past Medical History  Diagnosis Date  . Meralgia paresthetica     Dr. Doy Mince  . CTS (carpal tunnel syndrome)   . CAD (coronary artery disease)   . Osteoarthrosis, unspecified whether generalized or localized, lower leg   . Diabetes mellitus   . Hypertension   . Hyperlipidemia   . Dermatitis   . Morbid obesity   . PUD (peptic ulcer disease)     Current Outpatient Prescriptions  Medication Sig Dispense Refill  . albuterol (PROVENTIL HFA) 108 (90 BASE) MCG/ACT inhaler Inhale 2 puffs into the lungs every 4 (four) hours as needed for wheezing or shortness of breath.  1 Inhaler  3  . amLODipine (NORVASC) 5 MG tablet Take 5 mg by  mouth daily. If BP not < 140/90 with increased BP meds       . aspirin (ECOTRIN LOW STRENGTH) 81 MG EC tablet Take 81 mg by mouth daily.        . budesonide-formoterol (SYMBICORT) 160-4.5 MCG/ACT inhaler Inhale 2 puffs into the lungs 2 (two) times daily. Gargle after use  1 Inhaler  11  . carvedilol (COREG) 25 MG tablet TAKE 1 TABLET BY MOUTH EVERY MORNING AND 1/2 TABLET EVERY EVENING  90 tablet  1  . Flaxseed, Linseed, (FLAX SEED OIL) 1000 MG CAPS Take by mouth 3 (three) times daily.        . furosemide (LASIX) 40 MG tablet       . glucose blood (ONE TOUCH ULTRA TEST) test strip 1 each by Other route daily. Use as instructed       . hydrALAZINE (APRESOLINE) 25 MG tablet take 1 tablet by mouth twice a day  60 tablet  5  . Lancets (ONETOUCH ULTRASOFT) lancets 1 each by Other route as directed. Use as instructed       . Multiple Vitamin (MULTIVITAMIN) tablet Take 1 tablet by mouth daily.        . Omega-3 Fatty Acids (FISH OIL) 1000 MG CAPS Take by mouth daily.        Marland Kitchen oxyCODONE-acetaminophen (PERCOCET) 5-325 MG per tablet Take 1  tablet by mouth every 6 (six) hours as needed.        . simvastatin (ZOCOR) 40 MG tablet take 1 tablet by mouth once daily at bedtime  30 tablet  0  . topiramate (TOPAMAX) 50 MG tablet Take 50 mg by mouth 2 (two) times daily.        . verapamil (VERELAN PM) 360 MG 24 hr capsule take 1 capsule by mouth once daily  30 capsule  5  . spironolactone (ALDACTONE) 25 MG tablet take 1 tablet by mouth twice a day  60 tablet  5     PHYSICAL EXAM: Filed Vitals:   02/20/11 0932  Pulse: 74  Weight: 181.04 kg (399 lb 1.9 oz)  SpO2: 97%  Unable to get BP as BP cuff not large enough  General:   She is a morbidly obese woman who ambulates around the clinic very slowly with a walker.  +dyspnea HEENT:  Normal NECK:  Thick. Hard to assess JVD - appears 6-7.  Carotids are 2+ bilaterally. No bruits.  CARDIAC:  She has very distant heart sounds with a regular rate and rhythm.  2/6  systolic murmur RUSB. LUNGS:  Clear. ABDOMEN:  Morbidly obese.  Unable to appreciate for any organomegaly. No bruits or masses appreciated.  Good bowel sounds. EXTREMITIES:  Warm with no cyanosis or clubbing.  Trace edema.  NEURO:  She is alert and oriented x3.  Cranial nerves 2-12 are intact. Moves all 4 extremities.    ASSESSMENT & PLAN:

## 2011-02-20 NOTE — Assessment & Plan Note (Signed)
No evidence of ischemia. Continue current regimen.   

## 2011-02-20 NOTE — Assessment & Plan Note (Signed)
Volume status looks stable. Resume spironolactone.

## 2011-02-20 NOTE — Patient Instructions (Addendum)
Restart spironolactone.  Follow up 4 months.

## 2011-02-26 ENCOUNTER — Other Ambulatory Visit: Payer: Self-pay | Admitting: Internal Medicine

## 2011-03-05 ENCOUNTER — Ambulatory Visit (HOSPITAL_COMMUNITY)
Admission: RE | Admit: 2011-03-05 | Discharge: 2011-03-05 | Disposition: A | Discharge status: Home / Self Care | Payer: Medicare Other | Source: Ambulatory Visit | Attending: Internal Medicine | Admitting: Internal Medicine

## 2011-03-05 DIAGNOSIS — Z1231 Encounter for screening mammogram for malignant neoplasm of breast: Secondary | ICD-10-CM

## 2011-05-14 ENCOUNTER — Other Ambulatory Visit: Payer: Self-pay | Admitting: Internal Medicine

## 2011-05-18 ENCOUNTER — Other Ambulatory Visit: Payer: Self-pay | Admitting: Internal Medicine

## 2011-07-06 ENCOUNTER — Other Ambulatory Visit: Payer: Self-pay

## 2011-07-06 MED ORDER — ALBUTEROL SULFATE HFA 108 (90 BASE) MCG/ACT IN AERS: 2.0000 | INHALATION_SPRAY | RESPIRATORY_TRACT | Status: DC | PRN

## 2011-07-28 ENCOUNTER — Other Ambulatory Visit: Payer: Self-pay | Admitting: Internal Medicine

## 2011-11-03 ENCOUNTER — Ambulatory Visit (INDEPENDENT_AMBULATORY_CARE_PROVIDER_SITE_OTHER): Payer: Medicare Other

## 2011-11-03 DIAGNOSIS — Z23 Encounter for immunization: Secondary | ICD-10-CM

## 2011-11-21 ENCOUNTER — Other Ambulatory Visit: Payer: Self-pay | Admitting: Internal Medicine

## 2011-11-24 ENCOUNTER — Other Ambulatory Visit: Payer: Self-pay | Admitting: Internal Medicine

## 2011-11-24 NOTE — Telephone Encounter (Signed)
OK X1 but additional refills require office visit to update medical history.

## 2011-11-24 NOTE — Telephone Encounter (Signed)
OV 02/04/11. Carvedilol last filled 05/18/11 #90 x 1 Does pt need OV before refill?  PLz advise    MW

## 2012-01-01 ENCOUNTER — Other Ambulatory Visit: Payer: Self-pay | Admitting: Internal Medicine

## 2012-01-04 ENCOUNTER — Other Ambulatory Visit (INDEPENDENT_AMBULATORY_CARE_PROVIDER_SITE_OTHER): Payer: Medicare Other

## 2012-01-04 ENCOUNTER — Ambulatory Visit (INDEPENDENT_AMBULATORY_CARE_PROVIDER_SITE_OTHER): Payer: Medicare Other

## 2012-01-04 DIAGNOSIS — Z299 Encounter for prophylactic measures, unspecified: Secondary | ICD-10-CM

## 2012-01-04 DIAGNOSIS — T887XXA Unspecified adverse effect of drug or medicament, initial encounter: Secondary | ICD-10-CM

## 2012-01-04 DIAGNOSIS — Z23 Encounter for immunization: Secondary | ICD-10-CM

## 2012-01-04 DIAGNOSIS — E785 Hyperlipidemia, unspecified: Secondary | ICD-10-CM

## 2012-01-04 DIAGNOSIS — I10 Essential (primary) hypertension: Secondary | ICD-10-CM

## 2012-01-04 LAB — LIPID PANEL
Cholesterol: 138 mg/dL (ref 0–200)
HDL: 41.6 mg/dL (ref >39.00)
LDL Cholesterol: 85 mg/dL (ref 0–99)
Total CHOL/HDL Ratio: 3
Triglycerides: 56 mg/dL (ref 0.0–149.0)

## 2012-01-04 LAB — BASIC METABOLIC PANEL
BUN: 18 mg/dL (ref 6–23)
CO2: 27 mEq/L (ref 19–32)
Calcium: 8.8 mg/dL (ref 8.4–10.5)
Creatinine, Ser: 1 mg/dL (ref 0.4–1.2)

## 2012-01-04 LAB — CBC WITH DIFFERENTIAL/PLATELET
Basophils Relative: 1.4 % (ref 0.0–3.0)
Eosinophils Absolute: 0 10*3/uL (ref 0.0–0.7)
Eosinophils Relative: 0.9 % (ref 0.0–5.0)
Lymphocytes Relative: 24.5 % (ref 12.0–46.0)
Neutrophils Relative %: 67.3 % (ref 43.0–77.0)
Platelets: 171 10*3/uL (ref 150.0–400.0)
RBC: 4.24 Mil/uL (ref 3.87–5.11)
WBC: 5.3 10*3/uL (ref 4.5–10.5)

## 2012-01-04 LAB — CK: Total CK: 69 U/L (ref 7–177)

## 2012-01-15 ENCOUNTER — Ambulatory Visit (INDEPENDENT_AMBULATORY_CARE_PROVIDER_SITE_OTHER): Payer: Medicare Other

## 2012-01-15 DIAGNOSIS — R7309 Other abnormal glucose: Secondary | ICD-10-CM

## 2012-01-15 DIAGNOSIS — R739 Hyperglycemia, unspecified: Secondary | ICD-10-CM

## 2012-01-15 DIAGNOSIS — Z23 Encounter for immunization: Secondary | ICD-10-CM

## 2012-01-15 DIAGNOSIS — Z2911 Encounter for prophylactic immunotherapy for respiratory syncytial virus (RSV): Secondary | ICD-10-CM

## 2012-01-28 ENCOUNTER — Other Ambulatory Visit: Payer: Self-pay | Admitting: Internal Medicine

## 2012-01-28 DIAGNOSIS — Z1231 Encounter for screening mammogram for malignant neoplasm of breast: Secondary | ICD-10-CM

## 2012-01-29 NOTE — Telephone Encounter (Signed)
Refill done.  

## 2012-02-20 ENCOUNTER — Other Ambulatory Visit: Payer: Self-pay | Admitting: Internal Medicine

## 2012-02-22 NOTE — Telephone Encounter (Signed)
Patient needs to schedule a follow up appointment per last labs

## 2012-03-02 ENCOUNTER — Ambulatory Visit: Payer: Medicare Other | Admitting: Internal Medicine

## 2012-03-04 ENCOUNTER — Other Ambulatory Visit: Payer: Self-pay | Admitting: Internal Medicine

## 2012-03-04 ENCOUNTER — Ambulatory Visit (INDEPENDENT_AMBULATORY_CARE_PROVIDER_SITE_OTHER): Payer: Medicare Other | Admitting: Internal Medicine

## 2012-03-04 ENCOUNTER — Telehealth: Payer: Self-pay

## 2012-03-04 VITALS — BP 128/80 | HR 88 | Temp 98.3°F | Wt 393.0 lb

## 2012-03-04 DIAGNOSIS — E8881 Metabolic syndrome: Secondary | ICD-10-CM

## 2012-03-04 DIAGNOSIS — J209 Acute bronchitis, unspecified: Secondary | ICD-10-CM

## 2012-03-04 DIAGNOSIS — J069 Acute upper respiratory infection, unspecified: Secondary | ICD-10-CM

## 2012-03-04 DIAGNOSIS — J45901 Unspecified asthma with (acute) exacerbation: Secondary | ICD-10-CM

## 2012-03-04 MED ORDER — AMLODIPINE BESYLATE 5 MG PO TABS: 5.0000 mg | ORAL_TABLET | Freq: Every day | ORAL | Status: DC

## 2012-03-04 MED ORDER — BUDESONIDE-FORMOTEROL FUMARATE 160-4.5 MCG/ACT IN AERO: 2.0000 | INHALATION_SPRAY | Freq: Two times a day (BID) | RESPIRATORY_TRACT | Status: DC

## 2012-03-04 MED ORDER — ALBUTEROL SULFATE HFA 108 (90 BASE) MCG/ACT IN AERS: 2.0000 | INHALATION_SPRAY | RESPIRATORY_TRACT | Status: DC | PRN

## 2012-03-04 MED ORDER — DOXYCYCLINE HYCLATE 100 MG PO TABS: 100.0000 mg | ORAL_TABLET | Freq: Two times a day (BID) | ORAL | Status: DC

## 2012-03-04 MED ORDER — VERAPAMIL HCL ER 360 MG PO CP24: ORAL_CAPSULE | ORAL | Status: DC

## 2012-03-04 MED ORDER — FUROSEMIDE 40 MG PO TABS: 40.0000 mg | ORAL_TABLET | ORAL | Status: DC | PRN

## 2012-03-04 MED ORDER — ONETOUCH ULTRASOFT LANCETS MISC: Status: DC

## 2012-03-04 MED ORDER — SIMVASTATIN 40 MG PO TABS: ORAL_TABLET | ORAL | Status: DC

## 2012-03-04 MED ORDER — SPIRONOLACTONE 25 MG PO TABS: ORAL_TABLET | ORAL | Status: DC

## 2012-03-04 MED ORDER — GLUCOSE BLOOD VI STRP: ORAL_STRIP | Status: DC

## 2012-03-04 MED ORDER — CARVEDILOL 25 MG PO TABS: ORAL_TABLET | ORAL | Status: DC

## 2012-03-04 MED ORDER — FLUTICASONE PROPIONATE 50 MCG/ACT NA SUSP: 1.0000 | Freq: Two times a day (BID) | NASAL | Status: DC | PRN

## 2012-03-04 NOTE — Telephone Encounter (Signed)
Patient was here for a follow-up and no mention of labs, although labs stated "see me before refilling medications". Please advise

## 2012-03-04 NOTE — Patient Instructions (Addendum)
Plain Mucinex (NOT D) for thick secretions ;force NON dairy fluids .   Nasal cleansing in the shower as discussed with lather of mild shampoo.After 10 seconds wash off lather while  exhaling through nostrils. Make sure that all residual soap is removed to prevent irritation.  Fluticasone 1 spray in each nostril twice a day as needed. Use the "crossover" technique into opposite nostril spraying toward opposite ear @ 45 degree angle, not straight up into nostril.  Use a Neti pot daily only  as needed for significant sinus congestion; going from open side to congested side . Plain Allegra (NOT D )  160 daily , Loratidine 10 mg , OR Zyrtec 10 mg @ bedtime  as needed for itchy eyes & sneezing. Zicam Melts or Zinc lozenges ; vitamin C 2000 mg daily; & Echinacea for 4-7 days.     I reviewed your December labs again.To prevent Diabetes  The following nutritional interventions are recommended: Eat a low-fat diet with lots of fruits and vegetables, up to 7-9 servings per day. Consume less than  30  Grams (preferably ZERO) of sugar per day from foods & drinks with High Fructose Corn Syrup (HFCS) sugar as #1,2,3 or # 4 on label.[employer], [address] do not carry products with HFCS. Follow a  low carb nutrition program such as Ovid or The New Sugar Busters  to prevent  Progressive sugar elevation . White carbohydrates (potatoes, rice, bread, and pasta) have a high spike of sugar and a high load of sugar. For example a  baked potato has a cup of sugar and a  french fry  2 teaspoons of sugar. Yams, wild  rice, whole grained bread &  wheat pasta have been much lower spike and load of  sugar. Portions should be the size of a deck of cards or your palm.  Another option would be to reconsider Bariatric Surgery as your age would not be an issue. Your vital signs were good @ the office visit 03/04/12. The low grade heart monitor is not significant.It should be monitored annually or if shortness of breath  progresses especially @ rest.

## 2012-03-04 NOTE — Progress Notes (Signed)
  Subjective:    Patient ID: April [name], female    DOB: 1951/01/03, 62 y.o.   MRN: [id number]  HPI The respiratory tract symptoms began 02/29/12  As dry throat followed by burning throat , rhinitis, head congestion , & cough with  yellow  sputum as of 1/28.  Significant active  associated symptoms include persistent sore throat, nasal purulence, & earache. Cough is  associated with  shortness of breath and wheezing .   Fever , chills and sweats was were  present    Extrinsic symptoms of watery eyes & sneezing were present .      Flu shot  current        Treatment with  OTC Hall's drops, saline gargle was partially effective   There is  history of asthma & perennial allergies. The patient had never smoked .                  Review of Systems Symptoms not present include frontal headache, facial pain, dental pain,  and otic discharge Myalgias and arthralgias were not present       Objective:   Physical Exam General appearance: no acute distress or increased work of breathing is present.  No  lymphadenopathy about the head, neck, or axilla noted.  Eyes: No conjunctival inflammation or lid edema is present.  Ears:  External ear exam shows no significant lesions or deformities.  Otoscopic examination reveals clear canals, tympanic membranes are intact bilaterally without bulging, retraction, inflammation or discharge. Nose:  External nasal examination shows no deformity or inflammation. Nasal mucosa are erythematous & dry without lesions or exudates. No septal dislocation or deviation.No obstruction to airflow.  Oral exam: Dental hygiene is good; lips and gums are healthy appearing.There is no oropharyngeal erythema or exudate noted. Hoarse Neck:  No deformities,  masses, or tenderness noted.    Heart:  Normal rate and regular rhythm. S1 and S2 normal without gallop,  click, rub or other extra sounds. Grade 1/6 systolic murmur  Lungs:Chest clear to auscultation;  no wheezes, rhonchi,rales ,or rubs present.No increased work of breathing.   Extremities:  No cyanosis or clubbing  noted .1/2+ edema Skin: Warm & dry       Assessment & Plan:  #1 rhinosinusitis with bronchitis. No bronchospasm  Plan: Nasal hygiene interventions discussed. See prescription medications

## 2012-03-07 ENCOUNTER — Ambulatory Visit (HOSPITAL_COMMUNITY): Payer: Medicare Other

## 2012-03-08 NOTE — Telephone Encounter (Signed)
Per Dr.Hopper addendum to [facility] 03/04/2012 to be mailed to patient. I called patient and informed her that he addressed labs and a copy of OV will be mailed to her to replace the one given at last OV, patient verbalized understanding .

## 2012-03-09 ENCOUNTER — Ambulatory Visit (HOSPITAL_COMMUNITY)
Admission: RE | Admit: 2012-03-09 | Discharge: 2012-03-09 | Disposition: A | Discharge status: Home / Self Care | Payer: Medicare PPO | Source: Ambulatory Visit | Attending: Internal Medicine | Admitting: Internal Medicine

## 2012-03-09 DIAGNOSIS — Z1231 Encounter for screening mammogram for malignant neoplasm of breast: Secondary | ICD-10-CM | POA: Insufficient documentation

## 2012-03-21 ENCOUNTER — Telehealth: Payer: Self-pay | Admitting: Internal Medicine

## 2012-03-21 MED ORDER — HYDROCODONE-HOMATROPINE 5-1.5 MG/5ML PO SYRP: ORAL_SOLUTION | ORAL | Status: DC

## 2012-03-21 NOTE — Telephone Encounter (Signed)
The nurse heard wheezing over the phone; she needs a chest x-ray and followup office visit. Antibiotics will not treat asthma.

## 2012-03-21 NOTE — Telephone Encounter (Signed)
Discuss with patient, Rx sent. 

## 2012-03-21 NOTE — Telephone Encounter (Signed)
Uses Symbicort 2 puffs every 12 hours and the rescue inhaler every 4 hours as needed. If the wheezing or shortness rest progresses; it is imperative to be seen immediately.  Generic Hydromet 120 cc 1 teaspoon every 6-8 hours as needed. No history of codeine allergy in chart

## 2012-03-21 NOTE — Telephone Encounter (Signed)
Patient Information:  Caller Name: [name]  Phone: [phone]  Patient: April [name], April [name]  Gender: Female  DOB: 26-Jun-1950  Age: 62 Years  PCP: Unice Cobble  Office Follow Up:  Does the office need to follow up with this patient?: Yes  Instructions For The Office: OFFICE, PT REFUSED APPT AT RN SUGGESTION.  PT IS REQUESTING ANOTHER ROUND OF ANTIBIOTICS.  PT USES [facility] OFF [address]  RN Note:  RN could hear audible wheezing and RN strongly suggested that patient be seen in the office.  Pt reports her son has asthma and she can use his nebulizer machine if she needed too.  Symptoms  Reason For Call & Symptoms: pt was seen in the office on 03/04/12 for bronchitis, pt was put on doxycycline.  Pt reports she has completed the medication but still has the symptoms (URI).  Pt reports cough, sneezing, yellow drainage that she coughing up. Pt also reports chills and sweats.  Pt also reports she is having trouble sleeping.  Reviewed Health History In EMR: Yes  Reviewed Medications In EMR: Yes  Reviewed Allergies In EMR: Yes  Reviewed Surgeries / Procedures: Yes  Date of Onset of Symptoms: 03/04/2012  Guideline(s) Used:  Colds  Disposition Per Guideline:   See Today in Office  Reason For Disposition Reached:   Earache  Advice Given:  N/A  RN Overrode Recommendation:  Patient Requests Prescription  Pt states she does not want to come back into the office.  Pt is requesting another antibiotic to be called in for her

## 2012-03-21 NOTE — Telephone Encounter (Signed)
Per Dr April [name] Pt [facility]. Discuss with patient who states if her wheezing increase she would go but other than that she would like OV. Pt advise no OV for today and earlier appt with Dr April [name] is not until wed 9 am. Appt schedule.Please advise

## 2012-03-23 ENCOUNTER — Encounter: Payer: Self-pay | Admitting: Internal Medicine

## 2012-03-23 ENCOUNTER — Ambulatory Visit (INDEPENDENT_AMBULATORY_CARE_PROVIDER_SITE_OTHER): Payer: Medicare Other | Admitting: Internal Medicine

## 2012-03-23 VITALS — BP 144/92 | HR 78 | Temp 98.5°F | Wt 398.0 lb

## 2012-03-23 DIAGNOSIS — J45909 Unspecified asthma, uncomplicated: Secondary | ICD-10-CM | POA: Insufficient documentation

## 2012-03-23 DIAGNOSIS — J209 Acute bronchitis, unspecified: Secondary | ICD-10-CM

## 2012-03-23 MED ORDER — PREDNISONE 20 MG PO TABS: 20.0000 mg | ORAL_TABLET | Freq: Two times a day (BID) | ORAL | Status: DC

## 2012-03-23 MED ORDER — AZITHROMYCIN 250 MG PO TABS: ORAL_TABLET | ORAL | Status: DC

## 2012-03-23 NOTE — Patient Instructions (Addendum)
Asthma involves both smooth muscle spasm as well as airway inflammation. The rescue inhaler albuterol will treat the muscle spasm but should not be used more than every 4 days 6 hours on an as needed basis. Excessive use can lead to cardiac irregularities which can be significant. Medications containing anti-inflammatory agents such as Symbicort, Dulera, Advair, or the inhaled steroids do treat the inflammatory component. The should be used at maximal doses during acute flares. If stable they can be used at the lowest maintenance dose as prevention. Infection is a secondary complication of asthma and the main thrust should be treating the inflammation and bronchospasm.

## 2012-03-23 NOTE — Progress Notes (Signed)
  Subjective:    Patient ID: April [name], female    DOB: [birth year], 62 y.o.   MRN: [id number]  HPI  Her respiratory tract infection has been associated with an exacerbation of her asthma over the last 7-10 days. She's used her rescue inhaler only 3 occasions. It appears she typically uses her Symbicort one inhalation daily. Her rescue inhaler is usually used on average one time per week.  She called the nurse service requesting a refill of antibiotics. The nurse did hear her wheezing audibly over the phone.  Her asthma appeared as an adult possibly after a fume exposures. Her triggers at this time include infections, fumes, allergens, environmental changes.    Review of Systems The respiratory tract symptoms persist   as sore throat , chest congestion , cough with  yellow sputum,  earache.  No specific exposure for asthma other than  RTI.  Symptoms not present include frontal headache, facial pain, dental pain,  nasal purulence, & otic discharge  Cough is associated with  shortness of breath  & wheezing .  Chills&  sweats are present w/o fever .  Extrinsic symptoms of itchy/ watery eyes & sneezing are present .         Treatment with Delsym  NSAIDS Tylenol Mucinex Robitussin Alka Seltzer Plus decongestants Nyquil was partially effective   There is no history of asthma  seasonal perennial allergies. The patient had never smoked quit smoking in                Objective:   Physical Exam  General appearance:adequately nourished; no acute distress or increased work of breathing is present.   Eyes: No conjunctival inflammation or lid edema is present.  Ears:  External ear exam shows no significant lesions or deformities.  Otoscopic examination reveals clear canals, tympanic membranes are intact bilaterally without bulging, retraction, inflammation or discharge. Nose:  External nasal examination shows no deformity or inflammation. Nasal mucosa are dry without lesions or  exudates. No septal dislocation or deviation.No obstruction to airflow.  Oral exam: Dental hygiene is good; lips and gums are healthy appearing.There is no oropharyngeal erythema or exudate noted.  Neck:  No deformities, masses, or tenderness noted.    Heart:  Normal rate and regular rhythm. S1 and S2 normal without gallop, murmur, click, rub or other extra sounds.  S4 with slurring Lungs:Chest clear to auscultation; no wheezes, rhonchi,rales ,or rubs present.No increased work of breathing . Overall breath sounds are decreased. She exhibits a very harsh barking cough which is nonproductive. Extremities:  No cyanosis or clubbing  noted . Edema at the sock line 1/2+ bilaterally No  lymphadenopathy about the head, neck, or axilla noted.  Skin: Warm & dry           Assessment & Plan:  #1 Acute purulent bronchitis  #2 active asthma; present bronchodilator and anti-inflammatory medications are not being used optimally.  Plan: See orders and recommendations

## 2012-07-27 ENCOUNTER — Other Ambulatory Visit: Payer: Self-pay | Admitting: Neurology

## 2012-08-01 ENCOUNTER — Telehealth: Payer: Self-pay | Admitting: Neurology

## 2012-08-01 MED ORDER — TOPIRAMATE 50 MG PO TABS: 100.0000 mg | ORAL_TABLET | Freq: Two times a day (BID) | ORAL | Status: DC

## 2012-08-01 NOTE — Telephone Encounter (Signed)
Rx Sent  

## 2012-09-30 ENCOUNTER — Telehealth: Payer: Self-pay | Admitting: Internal Medicine

## 2012-09-30 DIAGNOSIS — E785 Hyperlipidemia, unspecified: Secondary | ICD-10-CM

## 2012-09-30 DIAGNOSIS — T887XXA Unspecified adverse effect of drug or medicament, initial encounter: Secondary | ICD-10-CM

## 2012-09-30 NOTE — Telephone Encounter (Signed)
09/30/2012  Mrs. April [name] came by the office. She is asking when the next time she needs to have labs done and if she needs a booster or tetanus shot. Please advise. bw

## 2012-09-30 NOTE — Telephone Encounter (Signed)
Lm @ (12:03pm) asking the pt's husband to RTC regarding note below.//AB/CMA

## 2012-10-05 NOTE — Telephone Encounter (Signed)
Lm @ (10:59am) asking the pt to RTC regarding note below.//AB/.CMA

## 2012-10-06 NOTE — Telephone Encounter (Signed)
Spoke with pt and she was scheduled for fasting labs for her appt on 10-18-12.  Future lab ordered and sent.//AB/CMA

## 2012-10-10 ENCOUNTER — Other Ambulatory Visit (INDEPENDENT_AMBULATORY_CARE_PROVIDER_SITE_OTHER): Payer: Medicare PPO

## 2012-10-10 DIAGNOSIS — R5381 Other malaise: Secondary | ICD-10-CM

## 2012-10-10 DIAGNOSIS — T887XXA Unspecified adverse effect of drug or medicament, initial encounter: Secondary | ICD-10-CM

## 2012-10-10 DIAGNOSIS — E785 Hyperlipidemia, unspecified: Secondary | ICD-10-CM

## 2012-10-10 LAB — LIPID PANEL
Cholesterol: 143 mg/dL (ref 0–200)
HDL: 38.3 mg/dL — ABNORMAL LOW (ref >39.00)
Total CHOL/HDL Ratio: 4
Triglycerides: 47 mg/dL (ref 0.0–149.0)

## 2012-10-10 LAB — HEPATIC FUNCTION PANEL
ALT: 15 U/L (ref 0–35)
AST: 14 U/L (ref 0–37)
Albumin: 3.5 g/dL (ref 3.5–5.2)
Alkaline Phosphatase: 56 U/L (ref 39–117)
Total Protein: 6.6 g/dL (ref 6.0–8.3)

## 2012-10-10 LAB — CBC WITH DIFFERENTIAL/PLATELET
Basophils Absolute: 0.1 10*3/uL (ref 0.0–0.1)
Eosinophils Relative: 1 % (ref 0.0–5.0)
HCT: 37 % (ref 36.0–46.0)
Lymphocytes Relative: 25.7 % (ref 12.0–46.0)
Lymphs Abs: 1.3 10*3/uL (ref 0.7–4.0)
Monocytes Relative: 6.6 % (ref 3.0–12.0)
Neutrophils Relative %: 65.6 % (ref 43.0–77.0)
Platelets: 174 10*3/uL (ref 150.0–400.0)
RDW: 15.4 % — ABNORMAL HIGH (ref 11.5–14.6)
WBC: 5 10*3/uL (ref 4.5–10.5)

## 2012-10-18 ENCOUNTER — Ambulatory Visit: Payer: Medicare PPO | Admitting: Internal Medicine

## 2012-10-19 ENCOUNTER — Telehealth (INDEPENDENT_AMBULATORY_CARE_PROVIDER_SITE_OTHER): Payer: Medicare PPO | Admitting: *Deleted

## 2012-10-19 ENCOUNTER — Encounter: Payer: Medicare PPO | Admitting: Internal Medicine

## 2012-10-19 ENCOUNTER — Encounter: Payer: Self-pay | Admitting: *Deleted

## 2012-10-19 ENCOUNTER — Ambulatory Visit: Payer: Medicare PPO

## 2012-10-19 DIAGNOSIS — Z23 Encounter for immunization: Secondary | ICD-10-CM

## 2012-10-19 NOTE — Telephone Encounter (Signed)
Error

## 2012-10-25 ENCOUNTER — Encounter: Payer: Self-pay | Admitting: Neurology

## 2012-10-25 ENCOUNTER — Ambulatory Visit (INDEPENDENT_AMBULATORY_CARE_PROVIDER_SITE_OTHER): Payer: Medicare HMO | Admitting: Neurology

## 2012-10-25 VITALS — BP 164/86 | HR 74 | Ht 64.0 in | Wt 385.0 lb

## 2012-10-25 DIAGNOSIS — I10 Essential (primary) hypertension: Secondary | ICD-10-CM

## 2012-10-25 DIAGNOSIS — E876 Hypokalemia: Secondary | ICD-10-CM

## 2012-10-25 DIAGNOSIS — E785 Hyperlipidemia, unspecified: Secondary | ICD-10-CM

## 2012-10-25 DIAGNOSIS — E538 Deficiency of other specified B group vitamins: Secondary | ICD-10-CM

## 2012-10-25 DIAGNOSIS — I2510 Atherosclerotic heart disease of native coronary artery without angina pectoris: Secondary | ICD-10-CM

## 2012-10-25 MED ORDER — DULOXETINE HCL 60 MG PO CPEP: 60.0000 mg | ORAL_CAPSULE | Freq: Every day | ORAL | Status: DC

## 2012-10-25 MED ORDER — TOPIRAMATE 100 MG PO TABS: 100.0000 mg | ORAL_TABLET | Freq: Two times a day (BID) | ORAL | Status: DC

## 2012-10-25 NOTE — Progress Notes (Signed)
[facility]  PATIENT: April [name] DOB: 1950/04/10  HISTORICAL April [name] is a 62 years old right-handed obese African American female, referred by her primary care physician Dr. Unice Cobble for evaluation of bilateral muscle cramping.  She had past medical history of obesity, diabetes, hyperlipidemia, hypertension, presenting with bilateral feet paresthesia muscle cramping.  I saw her previously for diabetic peripheral neuropathy, she complained of bilateral knee pain, bilateral feet paresthesia, difficulty bearing weight, gait difficulty, EMG nerve conduction study in January 2013 confirmed the diagnosis of axonal peripheral neuropathy, She has been taking Topamax 100 mg twice a day for bilateral feet paresthesia, previous laboratory evaluation demonstrated normal sodium, potassium, magnesium, CPK of 92.  She continued to suffer severe morbid obesity, continued to have low back pain, gait difficulty, bilateral knee pain, recent in one year, she also began to suffer bilateral feet muscle cramping, intermittent, severe, lasting 7-8 minutes, happen about once a week. It often involves her feet muscles, also involves her thigh muscles.  She continued to have significant obesity, gait difficulty.    REVIEW OF SYSTEMS: Full 14 system review of systems performed and notable only for swelling in legs, shortness of breath, cough, wheezing, anemia, easy bruising, feeling cold, joint pain, joint sweating, cramps, achy muscles, numbness, weakness, snoring, restless legs  ALLERGIES: Allergies  Allergen Reactions  . Penicillins     REACTION: rash  . Benazepril Hcl   . Hydrochlorothiazide W-Triamterene     REACTION: hypokalemia  . Metronidazole   . Spironolactone     REACTION: kidney problems  . Sulfonamide Derivatives     REACTION: internal "burning"  . Torsemide   . Valsartan     HOME MEDICATIONS: Outpatient Prescriptions Prior to Visit  Medication Sig Dispense  Refill  . albuterol (PROVENTIL HFA) 108 (90 BASE) MCG/ACT inhaler Inhale 2 puffs into the lungs every 4 (four) hours as needed for wheezing or shortness of breath.  1 Inhaler  11  . amLODipine (NORVASC) 5 MG tablet Take 1 tablet (5 mg total) by mouth daily. If BP not < 140/90 with increased BP meds  90 tablet  3  . aspirin (ECOTRIN LOW STRENGTH) 81 MG EC tablet Take 81 mg by mouth daily.        Marland Kitchen azithromycin (ZITHROMAX Z-PAK) 250 MG tablet 2 day 1, then 1 qd  6 each  0  . budesonide-formoterol (SYMBICORT) 160-4.5 MCG/ACT inhaler Inhale 2 puffs into the lungs 2 (two) times daily. Gargle after use  1 Inhaler  11  . carvedilol (COREG) 25 MG tablet TAKE 1 TABLET BY MOUTH EVERY MORNING AND 1/2 TABLET EVERY EVENING  135 tablet  3  . doxycycline (VIBRA-TABS) 100 MG tablet Take 1 tablet (100 mg total) by mouth 2 (two) times daily.  20 tablet  0  . Flaxseed, Linseed, (FLAX SEED OIL) 1000 MG CAPS Take by mouth 3 (three) times daily.        . fluticasone (FLONASE) 50 MCG/ACT nasal spray Place 1 spray into the nose 2 (two) times daily as needed for rhinitis.  16 g  2  . furosemide (LASIX) 40 MG tablet Take 1 tablet (40 mg total) by mouth as needed.  90 tablet  1  . glucose blood (ONE TOUCH ULTRA TEST) test strip Use as instructed  100 each  3  . hydrALAZINE (APRESOLINE) 25 MG tablet take 1 tablet by mouth twice a day  180 tablet  3  . HYDROcodone-homatropine (HYDROMET) 5-1.5 MG/5ML syrup 1 teaspoon every  6-8 hours as needed  120 mL  0  . Lancets (ONETOUCH ULTRASOFT) lancets Use as instructed  100 each  3  . Multiple Vitamin (MULTIVITAMIN) tablet Take 1 tablet by mouth daily.        . Omega-3 Fatty Acids (FISH OIL) 1000 MG CAPS Take by mouth daily.        . predniSONE (DELTASONE) 20 MG tablet Take 1 tablet (20 mg total) by mouth 2 (two) times daily.  14 tablet  0  . simvastatin (ZOCOR) 40 MG tablet 1 by mouth daily  90 tablet  3  . spironolactone (ALDACTONE) 25 MG tablet take 1 tablet by mouth twice a day   180 tablet  3  . verapamil (VERELAN PM) 360 MG 24 hr capsule 1 by mouth daily  90 capsule  3  . topiramate (TOPAMAX) 50 MG tablet Take 2 tablets (100 mg total) by mouth 2 (two) times daily.  120 tablet  2   No facility-administered medications prior to visit.    PAST MEDICAL HISTORY: Past Medical History  Diagnosis Date  . Meralgia paresthetica     Dr. Doy Mince  . CTS (carpal tunnel syndrome)   . CAD (coronary artery disease)   . Osteoarthrosis, unspecified whether generalized or localized, lower leg   . Diabetes mellitus   . Hypertension   . Hyperlipidemia   . Dermatitis   . Morbid obesity   . PUD (peptic ulcer disease)   . Unspecified hereditary and idiopathic peripheral neuropathy   . Morbid obesity   . Type II or unspecified type diabetes mellitus without mention of complication, not stated as uncontrolled   . Meralgia paresthetica   . Carpal tunnel syndrome     PAST SURGICAL HISTORY: Past Surgical History  Procedure Laterality Date  . Abdominal hysterectomy    . Dilation and curettage of uterus    . Tonsillectomy and adenoidectomy    . Cardiac catheterization  2002    non obstructive disease  . Renal calculi  12/1997    SVT with induction of anesthesia    FAMILY HISTORY: Family History  Problem Relation Age of Onset  . Colon cancer Mother   . Prostate cancer Father   . Colon cancer Father     SOCIAL HISTORY:  History   Social History  . Marital Status: Married    Spouse Name: [name]    Number of Children: 1  . Years of Education: BS   Occupational History  . Disabled    Social History Main Topics  . Smoking status: Never Smoker   . Smokeless tobacco: Never Used  . Alcohol Use: No  . Drug Use: No  . Sexual Activity: Not on file   Other Topics Concern  . Not on file   Social History Narrative   Patient lives at home with her husband [name] Patient is retired and has a Conservation officer, nature.    Caffeine - some times.   Right handed.      PHYSICAL EXAM   Filed Vitals:   10/25/12 1034  BP: 164/86  Pulse: 74  Height: 5\' 4"  (1.626 m)  Weight: 385 lb (174.635 kg)    Body mass index is 66.05 kg/(m^2).   Generalized: In no acute distress  Neck: Supple, no carotid bruits   Cardiac: Regular rate rhythm  Pulmonary: Clear to auscultation bilaterally  Musculoskeletal: No deformity  Neurological examination  Mentation: Alert oriented to time, place, history taking, and causual conversation, morbid obese.  Cranial nerve  II-XII: Pupils were equal round reactive to light extraocular movements were full, visual field were full on confrontational test. facial sensation and strength were normal. hearing was intact to finger rubbing bilaterally. Uvula tongue midline.  head turning and shoulder shrug and were normal and symmetric.Tongue protrusion into cheek strength was normal.  Motor: normal tone, bulk and strength.  Sensory: length dependent decreased to fine touch, pinprick to ankle level, preserved toe vibratory sensation, and proprioception.  Coordination: Normal finger to nose, heel-to-shin bilaterally there was no truncal ataxia  Gait: obese, need to push on chair arm to get up from seated position, cautious, very unsteady gait.   Deep tendon reflexes: Brachioradialis 2/2, biceps 2/2, triceps 2/2, patellar 2/2, Achilles trace, plantar responses were flexor bilaterally.   DIAGNOSTIC DATA (LABS, IMAGING, TESTING) - I reviewed patient records, labs, notes, testing and imaging myself where available.  Lab Results  Component Value Date   WBC 5.0 10/10/2012   HGB 12.1 10/10/2012   HCT 37.0 10/10/2012   MCV 88.0 10/10/2012   PLT 174.0 10/10/2012      Component Value Date/Time   NA 142 01/04/2012 0941   K 3.6 01/04/2012 0941   CL 107 01/04/2012 0941   CO2 27 01/04/2012 0941   GLUCOSE 121* 01/04/2012 0941   BUN 18 01/04/2012 0941   CREATININE 1.0 01/04/2012 0941   CALCIUM 8.8 01/04/2012 0941   PROT 6.6 10/10/2012 0841    ALBUMIN 3.5 10/10/2012 0841   AST 14 10/10/2012 0841   ALT 15 10/10/2012 0841   ALKPHOS 56 10/10/2012 0841   BILITOT 0.5 10/10/2012 0841   GFRNONAA 69.60 01/23/2010 1101   GFRAA 66 07/20/2006 1211   Lab Results  Component Value Date   CHOL 143 10/10/2012   HDL 38.30* 10/10/2012   LDLCALC 95 10/10/2012   LDLDIRECT 169.4 05/02/2007   TRIG 47.0 10/10/2012   CHOLHDL 4 10/10/2012   Lab Results  Component Value Date   HGBA1C 5.7 01/15/2012   No results found for this basename: VITAMINB12   Lab Results  Component Value Date   TSH 3.08 01/04/2012   ASSESSMENT AND PLAN   61 years old right-handed obese African American female, with past medical history of diabetes, axonal peripheral neuropathy, sedentary lifestyle, now presenting with frequent muscle cramping,  1.  laboratory evaluation to rule out electrolyte imbalance, thyroid disease, 2. she is already taking Topamax 100 mg twice a day will add on Cymbalta 60 mg a day 3. Return to clinic in 4-6 months with Rhae Hammock, M.D. Ph.D.  [facility] [address], [place], [place] [address] [phone]

## 2012-10-26 LAB — COMPREHENSIVE METABOLIC PANEL
AST: 12 IU/L (ref 0–40)
Albumin: 4.1 g/dL (ref 3.6–4.8)
Alkaline Phosphatase: 73 IU/L (ref 39–117)
BUN/Creatinine Ratio: 24 (ref 11–26)
BUN: 24 mg/dL (ref 8–27)
CO2: 27 mmol/L (ref 18–29)
Calcium: 9.4 mg/dL (ref 8.6–10.2)
Chloride: 105 mmol/L (ref 97–108)
Creatinine, Ser: 1.01 mg/dL — ABNORMAL HIGH (ref 0.57–1.00)
GFR calc Af Amer: 69 mL/min/{1.73_m2} (ref >59)
Globulin, Total: 2.3 g/dL (ref 1.5–4.5)
Sodium: 145 mmol/L — ABNORMAL HIGH (ref 134–144)

## 2012-10-26 LAB — TSH: TSH: 3.1 u[IU]/mL (ref 0.450–4.500)

## 2012-10-26 LAB — VITAMIN B12: Vitamin B-12: 183 pg/mL — ABNORMAL LOW (ref 211–946)

## 2012-10-26 LAB — CK: Total CK: 74 U/L (ref 24–173)

## 2012-10-26 NOTE — Addendum Note (Signed)
Addended by: Marcial Pacas on: 10/26/2012 12:18 PM   Modules accepted: Orders

## 2012-10-26 NOTE — Progress Notes (Signed)
Quick Note:  Please call patient, she has low vit B12, needs to come in for Vit b12 im supplement, 1071mcg im qday xone week, then q week xone month, then q month. Before start b12 im supplement, repeat lab, order placed. ______

## 2012-10-31 NOTE — Progress Notes (Signed)
Quick Note:  Spoke to patient and relayed lab results and B12, per Dr. [name]. Patient will come for another blood draw for B12 before starting IM injections. ______

## 2012-11-01 ENCOUNTER — Telehealth: Payer: Self-pay | Admitting: Internal Medicine

## 2012-11-01 NOTE — Telephone Encounter (Signed)
Patient calling about lab results from 10/10/12. Please advise.

## 2012-11-01 NOTE — Telephone Encounter (Signed)
Spoke with the pt and informed her of recent lab results and note.  Pt understood and agreed.  Mailed copy of results to the pt.//AB/CMA

## 2012-11-03 ENCOUNTER — Other Ambulatory Visit (INDEPENDENT_AMBULATORY_CARE_PROVIDER_SITE_OTHER): Payer: Medicare HMO

## 2012-11-03 ENCOUNTER — Other Ambulatory Visit: Payer: Self-pay | Admitting: Neurology

## 2012-11-03 DIAGNOSIS — E876 Hypokalemia: Secondary | ICD-10-CM

## 2012-11-03 DIAGNOSIS — E785 Hyperlipidemia, unspecified: Secondary | ICD-10-CM

## 2012-11-03 DIAGNOSIS — I10 Essential (primary) hypertension: Secondary | ICD-10-CM

## 2012-11-03 DIAGNOSIS — R3915 Urgency of urination: Secondary | ICD-10-CM

## 2012-11-03 DIAGNOSIS — Z0289 Encounter for other administrative examinations: Secondary | ICD-10-CM

## 2012-11-03 DIAGNOSIS — E538 Deficiency of other specified B group vitamins: Secondary | ICD-10-CM | POA: Insufficient documentation

## 2012-11-08 ENCOUNTER — Ambulatory Visit (INDEPENDENT_AMBULATORY_CARE_PROVIDER_SITE_OTHER): Payer: Medicare PPO | Admitting: Internal Medicine

## 2012-11-08 ENCOUNTER — Encounter: Payer: Self-pay | Admitting: Internal Medicine

## 2012-11-08 VITALS — BP 185/95 | HR 59 | Temp 98.7°F | Wt >= 6400 oz

## 2012-11-08 DIAGNOSIS — J209 Acute bronchitis, unspecified: Secondary | ICD-10-CM

## 2012-11-08 DIAGNOSIS — J45901 Unspecified asthma with (acute) exacerbation: Secondary | ICD-10-CM

## 2012-11-08 LAB — HOMOCYSTEINE: Homocysteine: 17.2 umol/L — ABNORMAL HIGH (ref 0.0–15.0)

## 2012-11-08 LAB — METHYLMALONIC ACID, SERUM: Methylmalonic Acid: 220 nmol/L (ref 0–378)

## 2012-11-08 MED ORDER — AZELASTINE HCL 0.1 % NA SOLN: 2.0000 | Freq: Two times a day (BID) | NASAL | Status: DC

## 2012-11-08 MED ORDER — AZITHROMYCIN 250 MG PO TABS: ORAL_TABLET | ORAL | Status: DC

## 2012-11-08 MED ORDER — BUDESONIDE-FORMOTEROL FUMARATE 160-4.5 MCG/ACT IN AERO: 2.0000 | INHALATION_SPRAY | Freq: Two times a day (BID) | RESPIRATORY_TRACT | Status: DC

## 2012-11-08 MED ORDER — HYDROCODONE-HOMATROPINE 5-1.5 MG/5ML PO SYRP: 5.0000 mL | ORAL_SOLUTION | Freq: Every evening | ORAL | Status: DC | PRN

## 2012-11-08 NOTE — Progress Notes (Signed)
  Subjective:    Patient ID: April [name], female    DOB: 1951-01-31, 62 y.o.   MRN: [id number]  HPI Acute visit Symptoms started 5 days ago: Headache, dry and  sore throat, phlegm accumulation in the throat that she needs to clear, initially phlegm was white now is  yellow. She has a history of asthma, is wheezing more frequently now, has not taken Symbicort regularly.     Past Medical History  Diagnosis Date  . Meralgia paresthetica     Dr. Doy Mince  . CTS (carpal tunnel syndrome)   . CAD (coronary artery disease)   . Osteoarthrosis, unspecified whether generalized or localized, lower leg   . Diabetes mellitus   . Hypertension   . Hyperlipidemia   . Dermatitis   . Morbid obesity   . PUD (peptic ulcer disease)   . Unspecified hereditary and idiopathic peripheral neuropathy   . Morbid obesity   . Type II or unspecified type diabetes mellitus without mention of complication, not stated as uncontrolled   . Meralgia paresthetica   . Carpal tunnel syndrome    Past Surgical History  Procedure Laterality Date  . Abdominal hysterectomy    . Dilation and curettage of uterus    . Tonsillectomy and adenoidectomy    . Cardiac catheterization  2002    non obstructive disease  . Renal calculi  12/1997    SVT with induction of anesthesia      Review of Systems No chest pain, some shortness or breath. No nausea vomiting, some diarrhea without blood in the stools. BP today is elevated, ambulatory BP usually better around 140/90, occasionally the diastolic BP goes to 123XX123.     Objective:   Physical Exam BP 185/95  Pulse 59  Temp(Src) 98.7 F (37.1 C)  Wt 414 lb 6.4 oz (187.971 kg)  BMI 71.1 kg/m2  SpO2 99% General -- alert, well-developed, NAD but freq cough noted.  HEENT--  TMs normal, throat symmetric, no redness or discharge. Face symmetric, sinuses not tender to palpation. Nose quite congested. Lungs --  No increased work of breathing but she has scattered wheezing  bilaterally, few rhonchi, no crackles.  Heart-- normal rate, regular rhythm, no murmur.   Extremities-- no pretibial edema bilaterally  Psych-- Cognition and judgment appear intact. Cooperative with normal attention span and concentration. No anxious appearing , no depressed appearing.      Assessment & Plan:   Acute asthma exacerbation,  Sx c/w asthma exacerbation, strep test negative Has not been taking Symbicort regularly Plan:  Symbicort twice a day, sample and a prescription sent zpack Astepro to help with nasal congestion Albuterol as needed RF hydrocodone  See instructions  BP elevated, recommend self-monitoring and see PCP in one month, noting she takes 2 CCBs

## 2012-11-08 NOTE — Progress Notes (Signed)
Quick Note:  Butch Penny: please call patient, lab showed low B12, she needs to take 123456 im shots, folic acid for elevated homocysteine level. ______

## 2012-11-08 NOTE — Patient Instructions (Addendum)
Rest, fluids , tylenol For cough, take Mucinex DM twice a day as needed  If the cough is severe at night: use hydrocodone, will cause drowsiness  symbicort twice a day, albuterol if wheezing  For congestion use astelin nasal spray twice a day until you feel better Take the antibiotic as prescribed  (zithromax) Call if no better in few days Call anytime if the symptoms are severe (or go to the ER)  your blood ressure is elevated today, please check the  blood pressure 2 or 3 times a week, be sure it is between 110/60 and 140/85. If it is consistently higher or lower, let me know Make an appointment to see your primary MD in one month

## 2012-11-09 ENCOUNTER — Ambulatory Visit: Payer: Medicare PPO | Admitting: Internal Medicine

## 2012-11-10 NOTE — Progress Notes (Signed)
Quick Note:  I called pt with results of lab work. Recommendation of B 12 injections (1042mcg IM daily for 5 days, then once weekly for 4 wks then monthly thereafter). She may receive here for go to pcp. She wanted to know financial responsibility ? Insurance. Referred to Billing. ______

## 2012-11-11 ENCOUNTER — Telehealth: Payer: Self-pay | Admitting: Neurology

## 2012-11-15 ENCOUNTER — Telehealth: Payer: Self-pay | Admitting: Neurology

## 2012-11-15 NOTE — Telephone Encounter (Signed)
Call pt about B-12 that she said that Dr. [name] advise her to take. Pt states that her insurance will not pay for it, if the Dr. [name] does not call them and let them know that it is medically necessary. If Dr. [name] could please call the insurance number at [phone] to let them know that this shot is medically necessary, the pt states. Please advise.

## 2012-11-15 NOTE — Telephone Encounter (Signed)
April [name], Please call her insurance company,  there is evidence of Vit B12 deficiency, she needs to be on Vit B12 im supplement.

## 2012-11-18 NOTE — Telephone Encounter (Signed)
I called and spoke to pt and gave her the information that from [facility] in the medical intake dept [phone] that no PA required for b 12 injections.  CPT [id number] and J [id number].  Benefits 100%covered, no copay, no max for outpt. Pt is responsible for medicare allowable and this is related to how this is billed.  (no amount was given to me).  Ref # [id number].  I relayed this information to pt.

## 2012-11-22 NOTE — Telephone Encounter (Signed)
I called and spoke to pt this am and also again this afternoon for the B12 insurance PA/nformation.  I called [phone] and spoke to [place].  She will fax to me at [phone] the letter relating to no PA required.  She was unable to give me a cost for the pt until after the injection done and the insurance billed. I will relay to pt.

## 2012-11-22 NOTE — Telephone Encounter (Signed)
Pt given information.  Copy of no PA required note to be given to pt when comes in on Monday 11-28-12 at 1100.  Pt verbalized understanding.

## 2012-11-22 NOTE — Telephone Encounter (Signed)
I called pt and gave her this information again below.  She will call insurance co again.

## 2012-11-28 ENCOUNTER — Ambulatory Visit (INDEPENDENT_AMBULATORY_CARE_PROVIDER_SITE_OTHER): Payer: Medicare PPO | Admitting: Internal Medicine

## 2012-11-28 ENCOUNTER — Ambulatory Visit (INDEPENDENT_AMBULATORY_CARE_PROVIDER_SITE_OTHER): Payer: Medicare HMO | Admitting: *Deleted

## 2012-11-28 ENCOUNTER — Encounter: Payer: Self-pay | Admitting: Internal Medicine

## 2012-11-28 VITALS — BP 182/92 | HR 82 | Temp 98.7°F | Wt >= 6400 oz

## 2012-11-28 DIAGNOSIS — G35 Multiple sclerosis: Secondary | ICD-10-CM

## 2012-11-28 DIAGNOSIS — E538 Deficiency of other specified B group vitamins: Secondary | ICD-10-CM

## 2012-11-28 DIAGNOSIS — R609 Edema, unspecified: Secondary | ICD-10-CM

## 2012-11-28 DIAGNOSIS — I10 Essential (primary) hypertension: Secondary | ICD-10-CM

## 2012-11-28 MED ORDER — CYANOCOBALAMIN 1000 MCG/ML IJ SOLN
1000.0000 ug | Freq: Once | INTRAMUSCULAR | Status: AC
  Administered 2012-11-28 – 2012-11-29 (×2): 1000 ug via INTRAMUSCULAR

## 2012-11-28 MED ORDER — DOXYCYCLINE HYCLATE 100 MG PO TABS: 100.0000 mg | ORAL_TABLET | Freq: Two times a day (BID) | ORAL | Status: DC

## 2012-11-28 NOTE — Patient Instructions (Signed)
Carvedilol 1 tablet twice a day Lasix and Aldactone one tablet every day. For the next 3 days actually take Lasix 2 tablets every morning to help with the swelling Keep the legs elevated one-hour in the morning and one hour in the afternoon Watch your salt intake Doxycycline for one week Call if chest pain, increased shortness or breath or increased swelling See Dr. Linna Darner as planned    2 Gram Low Sodium Diet A 2 gram sodium diet restricts the amount of sodium in the diet to no more than 2 g or 2000 mg daily. Limiting the amount of sodium is often used to help lower blood pressure. It is important if you have heart, liver, or kidney problems. Many foods contain sodium for flavor and sometimes as a preservative. When the amount of sodium in a diet needs to be low, it is important to know what to look for when choosing foods and drinks. The following includes some information and guidelines to help make it easier for you to adapt to a low sodium diet. QUICK TIPS  Do not add salt to food.  Avoid convenience items and fast food.  Choose unsalted snack foods.  Buy lower sodium products, often labeled as "lower sodium" or "no salt added."  Check food labels to learn how much sodium is in 1 serving.  When eating at a restaurant, ask that your food be prepared with less salt or none, if possible. READING FOOD LABELS FOR SODIUM INFORMATION The nutrition facts label is a good place to find how much sodium is in foods. Look for products with no more than 500 to 600 mg of sodium per meal and no more than 150 mg per serving. Remember that 2 g = 2000 mg. The food label may also list foods as:  Sodium-free: Less than 5 mg in a serving.  Very low sodium: 35 mg or less in a serving.  Low-sodium: 140 mg or less in a serving.  Light in sodium: 50% less sodium in a serving. For example, if a food that usually has 300 mg of sodium is changed to become light in sodium, it will have 150 mg of  sodium.  Reduced sodium: 25% less sodium in a serving. For example, if a food that usually has 400 mg of sodium is changed to reduced sodium, it will have 300 mg of sodium. CHOOSING FOODS Grains  Avoid: Salted crackers and snack items. Some cereals, including instant hot cereals. Bread stuffing and biscuit mixes. Seasoned rice or pasta mixes.  Choose: Unsalted snack items. Low-sodium cereals, oats, puffed wheat and rice, shredded wheat. English muffins and bread. Pasta. Meats  Avoid: Salted, canned, smoked, spiced, pickled meats, including fish and poultry. Bacon, ham, sausage, cold cuts, hot dogs, anchovies.  Choose: Low-sodium canned tuna and salmon. Fresh or frozen meat, poultry, and fish. Dairy  Avoid: Processed cheese and spreads. Cottage cheese. Buttermilk and condensed milk. Regular cheese.  Choose: Milk. Low-sodium cottage cheese. Yogurt. Sour cream. Low-sodium cheese. Fruits and Vegetables  Avoid: Regular canned vegetables. Regular canned tomato sauce and paste. Frozen vegetables in sauces. Olives. Angie Fava. Relishes. Sauerkraut.  Choose: Low-sodium canned vegetables. Low-sodium tomato sauce and paste. Frozen or fresh vegetables. Fresh and frozen fruit. Condiments  Avoid: Canned and packaged gravies. Worcestershire sauce. Tartar sauce. Barbecue sauce. Soy sauce. Steak sauce. Ketchup. Onion, garlic, and table salt. Meat flavorings and tenderizers.  Choose: Fresh and dried herbs and spices. Low-sodium varieties of mustard and ketchup. Lemon juice. Tabasco sauce. Horseradish. SAMPLE  2 GRAM SODIUM MEAL PLAN Breakfast / Sodium (mg)  1 cup low-fat milk / [id number] mg  2 slices whole-wheat toast / 270 mg  1 tbs heart-healthy margarine / 153 mg  1 hard-boiled egg / 139 mg  1 small orange / 0 mg Lunch / Sodium (mg)  1 cup raw carrots / 76 mg   cup hummus / 298 mg  1 cup low-fat milk / 143 mg   cup red grapes / 2 mg  1 whole-wheat pita bread / 356 mg Dinner / Sodium  (mg)  1 cup whole-wheat pasta / 2 mg  1 cup low-sodium tomato sauce / 73 mg  3 oz lean ground beef / 57 mg  1 small side salad (1 cup raw spinach leaves,  cup cucumber,  cup yellow bell pepper) with 1 tsp olive oil and 1 tsp red wine vinegar / 25 mg Snack / Sodium (mg)  1 container low-fat vanilla yogurt / 107 mg  3 graham cracker squares / 127 mg Nutrient Analysis  Calories: 2033  Protein: 77 g  Carbohydrate: 282 g  Fat: 72 g  Sodium: 1971 mg Document Released: 01/19/2005 Document Revised: 04/13/2011 Document Reviewed: 04/22/2009 [employer] Patient Information 2014 [place], [place].

## 2012-11-28 NOTE — Progress Notes (Signed)
  Subjective:    Patient ID: April [name], female    DOB: Jun 17, 1950, 62 y.o.   MRN: [id number]  HPI  Acute visit, here with her husband. Chief complaint is lower extremity edema for the last few days, bilaterally. She does have a scratch on the right pretibial area for several days and is not going away. BP is noted to be elevated, see assessment and plan here  Past Medical History  Diagnosis Date  . Meralgia paresthetica     Dr. Doy Mince  . CTS (carpal tunnel syndrome)   . CAD (coronary artery disease)   . Osteoarthrosis, unspecified whether generalized or localized, lower leg   . Diabetes mellitus   . Hypertension   . Hyperlipidemia   . Dermatitis   . Morbid obesity   . PUD (peptic ulcer disease)   . Unspecified hereditary and idiopathic peripheral neuropathy   . Morbid obesity   . Type II or unspecified type diabetes mellitus without mention of complication, not stated as uncontrolled   . Meralgia paresthetica   . Carpal tunnel syndrome   . Asthma    Past Surgical History  Procedure Laterality Date  . Abdominal hysterectomy    . Dilation and curettage of uterus    . Tonsillectomy and adenoidectomy    . Cardiac catheterization  2002    non obstructive disease  . Renal calculi  12/1997    SVT with induction of anesthesia     Review of Systems No fever or chills No chest pain, dyspnea on exertion is at baseline. No orthopnea, no paroxysmal nocturnal dyspnea.     Objective:   Physical Exam BP 182/92  Pulse 82  Temp(Src) 98.7 F (37.1 C)  Wt 401 lb (181.892 kg)  BMI 68.8 kg/m2  SpO2 90% General -- alert, well-developed, NAD.  Lungs -- normal respiratory effort, no intercostal retractions, no accessory muscle use, and normal breath sounds.  Heart-- normal rate, regular rhythm, no murmur.   Extremities--  ++ pitting edema ~   ++/+++ at the pretibial areas L>R, some pain with palpation but  No varicose veins or  phlebitis on exam, mild redness from the mid  pretibial area down bilaterally. Good pedal pulses bilaterally Neurologic--  alert & oriented X3. Speech normal, gait normal, strength normal in all extremities.  Psych-- Cognition and judgment appear intact. Cooperative with normal attention span and concentration. No anxious appearing , no depressed appearing.      Assessment & Plan:

## 2012-11-28 NOTE — Patient Instructions (Signed)
See tomorrow at 0800.

## 2012-11-28 NOTE — Assessment & Plan Note (Addendum)
BP elevated for the second time, currently taking the following medications: Verapamil 360 mgs qd  Carvedilol 25 mg one tablet in the morning, does not take half tablet at night Hydralazine 25 mg twice a day Lasix and Aldactone most days, sometimes skip if she is going to be out of her house. Amlodipine 5 mg only if BP elevated Plan: Carvedilol 25 mg twice a day Lasix and Aldactone daily, for the next 3 days take  Lasix 2 tabs a day to help edema

## 2012-11-28 NOTE — Assessment & Plan Note (Addendum)
Increased edema lately, no weight increase per our scales, DOE at baseline. Question of cellulitis Plan: Leg elevation 1 hour twice a day Extra 1 tab of  lasix every day  for 3 days Doxycycline Followup with PCP 12-15-12

## 2012-11-28 NOTE — Progress Notes (Signed)
Pt here for B 12 injection.  Under aseptic technique cyanocobalamin 1000mcg/1ml IM given L deltoid.  Tolerated well.  Bandaid applied.  

## 2012-11-29 ENCOUNTER — Ambulatory Visit (INDEPENDENT_AMBULATORY_CARE_PROVIDER_SITE_OTHER): Payer: Medicare HMO | Admitting: Neurology

## 2012-11-29 DIAGNOSIS — E538 Deficiency of other specified B group vitamins: Secondary | ICD-10-CM

## 2012-11-29 MED ORDER — CYANOCOBALAMIN 1000 MCG/ML IJ SOLN
1000.0000 ug | Freq: Once | INTRAMUSCULAR | Status: AC
  Administered 2012-11-30 – 2012-12-02 (×3): 1000 ug via INTRAMUSCULAR

## 2012-11-29 NOTE — Progress Notes (Signed)
Patient here for B12 injection.  Under aseptic technique Cyanocobalamin 1055mcg/1ml given IM in right deltoid.  Tolerated well.  Band-Aid applied.

## 2012-11-29 NOTE — Patient Instructions (Signed)
Patient will return tomorrow for another B12 injection.

## 2012-11-30 ENCOUNTER — Ambulatory Visit (INDEPENDENT_AMBULATORY_CARE_PROVIDER_SITE_OTHER): Payer: Medicare HMO | Admitting: *Deleted

## 2012-11-30 DIAGNOSIS — E538 Deficiency of other specified B group vitamins: Secondary | ICD-10-CM

## 2012-11-30 NOTE — Progress Notes (Signed)
Pt here for B 12 injection.  Under aseptic technique cyanocobalamin 1000mcg/1ml IM given L deltoid.  Tolerated well.  Bandaid applied.  

## 2012-11-30 NOTE — Patient Instructions (Signed)
See tomorrow at around 0800.

## 2012-12-01 ENCOUNTER — Ambulatory Visit (INDEPENDENT_AMBULATORY_CARE_PROVIDER_SITE_OTHER): Payer: Medicare HMO | Admitting: *Deleted

## 2012-12-01 DIAGNOSIS — E538 Deficiency of other specified B group vitamins: Secondary | ICD-10-CM

## 2012-12-01 NOTE — Progress Notes (Signed)
Pt here for B 12 injection.  Under aseptic technique cyanocobalamin 1000mcg/1ml IM given R deltoid.  Tolerated well.  Bandaid applied.  

## 2012-12-01 NOTE — Patient Instructions (Signed)
See tomorrow at 0800.

## 2012-12-02 ENCOUNTER — Ambulatory Visit (INDEPENDENT_AMBULATORY_CARE_PROVIDER_SITE_OTHER): Payer: Medicare HMO | Admitting: *Deleted

## 2012-12-02 DIAGNOSIS — E538 Deficiency of other specified B group vitamins: Secondary | ICD-10-CM

## 2012-12-02 NOTE — Progress Notes (Signed)
Pt here for B 12 injection.  Under aseptic technique cyanocobalamin 1000mcg/1ml IM given R deltoid.  Tolerated well.  Bandaid applied.  

## 2012-12-02 NOTE — Patient Instructions (Signed)
Next Monday at 1000.

## 2012-12-05 ENCOUNTER — Ambulatory Visit (INDEPENDENT_AMBULATORY_CARE_PROVIDER_SITE_OTHER): Payer: Medicare HMO | Admitting: Neurology

## 2012-12-05 DIAGNOSIS — E538 Deficiency of other specified B group vitamins: Secondary | ICD-10-CM

## 2012-12-05 MED ORDER — CYANOCOBALAMIN 1000 MCG/ML IJ SOLN
1000.0000 ug | Freq: Once | INTRAMUSCULAR | Status: AC
  Administered 2012-12-05: 1000 ug via INTRAMUSCULAR

## 2012-12-05 NOTE — Patient Instructions (Signed)
Patient will return next week for B12 injection.  Today is first weekly injection for 4 weeks.

## 2012-12-05 NOTE — Progress Notes (Signed)
Patient here for B12 injection.  Under aseptic technique Cyanocobalamin 1062mcg/1ml given IM in left deltoid.  Tolerated well.  Band-Aid applied.

## 2012-12-12 ENCOUNTER — Ambulatory Visit (INDEPENDENT_AMBULATORY_CARE_PROVIDER_SITE_OTHER): Payer: Medicare HMO | Admitting: Neurology

## 2012-12-12 DIAGNOSIS — E538 Deficiency of other specified B group vitamins: Secondary | ICD-10-CM

## 2012-12-12 MED ORDER — CYANOCOBALAMIN 1000 MCG/ML IJ SOLN
1000.0000 ug | Freq: Once | INTRAMUSCULAR | Status: AC
  Administered 2012-12-12: 1000 ug via INTRAMUSCULAR

## 2012-12-12 NOTE — Patient Instructions (Signed)
Patient will return next week for B12 injection.  Today is 2nd weekly injection for 4 weeks.

## 2012-12-12 NOTE — Progress Notes (Signed)
Patient here for B12 injection.  Under aseptic technique Cyanocobalamin 1078mcg given IM in right deltoid.  Tolerated well.  Band-Aid applied.

## 2012-12-14 ENCOUNTER — Telehealth: Payer: Self-pay

## 2012-12-14 NOTE — Telephone Encounter (Signed)
Medication and allergies: reviewed and updated  90 day supply/mail order: na Local pharmacy: [facility]   Immunizations due:  UTD A/P:   No changes to [place] or PSH Tired all the time--taking B12 injections-- Dr [name] Eye Exam--12/07/2012--using eye drops Regular eye exam 04/2012-no retinopathy   To Discuss with Provider: Swelling, ankles and toes very concerned

## 2012-12-15 ENCOUNTER — Encounter: Payer: Self-pay | Admitting: Internal Medicine

## 2012-12-15 ENCOUNTER — Ambulatory Visit (INDEPENDENT_AMBULATORY_CARE_PROVIDER_SITE_OTHER): Payer: Medicare PPO | Admitting: Internal Medicine

## 2012-12-15 VITALS — BP 148/90 | HR 82 | Temp 98.0°F | Ht 64.25 in | Wt >= 6400 oz

## 2012-12-15 DIAGNOSIS — M1710 Unilateral primary osteoarthritis, unspecified knee: Secondary | ICD-10-CM

## 2012-12-15 DIAGNOSIS — Z860100 Personal history of colon polyps, unspecified: Secondary | ICD-10-CM | POA: Insufficient documentation

## 2012-12-15 DIAGNOSIS — R0609 Other forms of dyspnea: Secondary | ICD-10-CM

## 2012-12-15 DIAGNOSIS — I10 Essential (primary) hypertension: Secondary | ICD-10-CM

## 2012-12-15 DIAGNOSIS — R0683 Snoring: Secondary | ICD-10-CM

## 2012-12-15 DIAGNOSIS — Z0000 Encounter for general adult medical examination without abnormal findings: Secondary | ICD-10-CM

## 2012-12-15 DIAGNOSIS — I5032 Chronic diastolic (congestive) heart failure: Secondary | ICD-10-CM

## 2012-12-15 NOTE — Progress Notes (Signed)
Subjective:    Patient ID: April [name], female    DOB: 1950-03-14, 62 y.o.   MRN: [id number]  HPI Medicare Wellness Visit: Psychosocial and medical history were reviewed as required by Medicare (history related to abuse, antisocial behavior , firearm risk). Social history: Caffeine:24 oz colas/ day  , Alcohol:no  , Tobacco [id number] Exercise:sitting extremities exercises Personal safety/fall risk:fell 8/14 due to weakness from knee DJD  Limitations of activities of daily living:help needed lifting,dressing, & reaching Seatbelt/ smoke alarm use:yes Healthcare Power of Attorney/Living Will status: needed Ophthalmologic exam status:current Hearing evaluation status:not current Orientation: Oriented X 3 Memory and recall: good Spelling  testing: good Depression/anxiety assessment: denied Foreign travel history:never Immunization status for influenza/pneumonia/ shingles /tetanus:current Transfusion history:no Preventive health care maintenance status: Colonoscopy/mammogram/Pap as per protocol/standard care:current. Weight precludes BMD Dental care:every 6 mos Chart reviewed and updated. Active issues reviewed and addressed as documented below.    Review of Systems Blood pressure range 140-160/80-90  Compliant with anti hypertemsive medication. Occasional postural lightheadedness .No other adverse medication effect described.   Significant headaches, epistaxis, chest pain,  claudication, paroxysmal nocturnal dyspnea, or edema absent.Chronic  exertional dyspnea. Occasional palpitations with exertion.     Objective:   Physical Exam  Gen.: Morbidly obese in appearance. Alert, appropriate and cooperative throughout exam.  Head: Normocephalic without obvious abnormalities  Eyes: No corneal or conjunctival inflammation noted. Pupils equal round reactive to light and accommodation. Extraocular motion intact.  Ears: External  ear exam reveals no significant lesions or deformities. Canals  clear .TMs normal. Hearing is grossly normal bilaterally. Nose: External nasal exam reveals no deformity or inflammation. Nasal mucosa are pink and moist. No lesions or exudates noted.   Mouth: Oral mucosa and oropharynx reveal no lesions or exudates. Teeth in good repair. Neck: No deformities, masses, or tenderness noted. Range of motion & Thyroid normal. Lungs: Normal respiratory effort; chest expands symmetrically. Lungs are clear to auscultation without rales, wheezes, or increased work of breathing. Heart: Normal rate and rhythm. Normal S1 and S2. No gallop, click, or rub. S4 w/o murmur. Abdomen: Bowel sounds normal; abdomen soft and nontender. No masses, organomegaly or hernias noted.Dullness RUQ. Abdomen massive Genitalia:  as per Gyn                                  Musculoskeletal/extremities: Accentuated curvature of upper thoracic spine. No clubbing or cyanosis. Tense leg edema.Significant fusiform knee  deformity noted. Hand joints normal . Fingernail  health good. Able to lie down & sit up with some help.  Vascular: Carotid, radial artery, dorsalis pedis and  posterior tibial pulses are full and equal. No bruits present. Neurologic: Alert and oriented x3. Deep tendon reflexes symmetrical but decreased Gait normal  including heel & toe walking . Rhomberg & finger to nose       Skin: Intact without suspicious lesions or rashes. Lymph: No cervical, axillary lymphadenopathy present. Psych: Mood and affect are normal. Normally interactive  Assessment & Plan:  #1 Medicare Wellness Exam; criteria met ; data entered #2 Problem List/Diagnoses reviewed Plan:  Assessments made/ Orders entered. Sleep apnea assessment as prelude to Bariatric Surgery evaluation

## 2012-12-15 NOTE — Progress Notes (Signed)
Pre visit review using our clinic review tool, if applicable. No additional management support is needed unless otherwise documented below in the visit note. 

## 2012-12-15 NOTE — Patient Instructions (Addendum)
I recommend Sleep Medicine & Bariatric Surgery consultations to determine optimal therapy. Please review the medication list in the After Visit Summary provided.Please verify the medication name (this may be  brand or generic) & correct dosage. Write the name of the prescribing physician to the right of the medication and share this with all medical staff seen at each appointment. This will help provide continuity of care; help optimize therapeutic interventions;and help prevent drug:drug adverse reaction. I'll ask Dr Haroldine Laws if he feels present cardiovascular medications should be adjusted as BP is suboptimally controlled .

## 2012-12-19 ENCOUNTER — Ambulatory Visit (INDEPENDENT_AMBULATORY_CARE_PROVIDER_SITE_OTHER): Payer: Medicare HMO | Admitting: Neurology

## 2012-12-19 DIAGNOSIS — E538 Deficiency of other specified B group vitamins: Secondary | ICD-10-CM

## 2012-12-19 DIAGNOSIS — Z0289 Encounter for other administrative examinations: Secondary | ICD-10-CM

## 2012-12-19 MED ORDER — CYANOCOBALAMIN 1000 MCG/ML IJ SOLN
1000.0000 ug | Freq: Once | INTRAMUSCULAR | Status: AC
  Administered 2012-12-19: 1000 ug via INTRAMUSCULAR

## 2012-12-19 NOTE — Patient Instructions (Signed)
Patient will return next week for B12 injection, today was week 3/4 weekly injections.

## 2012-12-19 NOTE — Progress Notes (Signed)
Patient here for B12 injection.  Under aseptic technique Cyanocobalamin 1074mcg/1ml given IM in left deltoid.  Tolerated well.  Band-Aid applied.

## 2012-12-26 ENCOUNTER — Ambulatory Visit (INDEPENDENT_AMBULATORY_CARE_PROVIDER_SITE_OTHER): Payer: Medicare HMO | Admitting: Neurology

## 2012-12-26 DIAGNOSIS — E538 Deficiency of other specified B group vitamins: Secondary | ICD-10-CM

## 2012-12-26 MED ORDER — CYANOCOBALAMIN 1000 MCG/ML IJ SOLN
1000.0000 ug | Freq: Once | INTRAMUSCULAR | Status: AC
  Administered 2012-12-26: 1000 ug via INTRAMUSCULAR

## 2012-12-26 NOTE — Progress Notes (Signed)
Patient here for B12 injection.  Under aseptic technique Cyanocobalamin 101mcg/1ml given IM in right deltoid.  Tolerated well.  Band-Aid applied.

## 2012-12-26 NOTE — Patient Instructions (Signed)
Patient will return next week to start monthly B12 injections.

## 2013-01-02 ENCOUNTER — Ambulatory Visit: Payer: Self-pay

## 2013-01-02 ENCOUNTER — Ambulatory Visit (INDEPENDENT_AMBULATORY_CARE_PROVIDER_SITE_OTHER): Payer: Medicare HMO | Admitting: *Deleted

## 2013-01-02 DIAGNOSIS — E538 Deficiency of other specified B group vitamins: Secondary | ICD-10-CM

## 2013-01-02 MED ORDER — CYANOCOBALAMIN 1000 MCG/ML IJ SOLN
1000.0000 ug | INTRAMUSCULAR | Status: AC
  Administered 2013-01-02 – 2013-05-03 (×5): 1000 ug via INTRAMUSCULAR

## 2013-01-02 NOTE — Patient Instructions (Signed)
See pt now monthly for injections.

## 2013-01-02 NOTE — Progress Notes (Signed)
Pt here for B 12 injection.  Under aseptic technique cyanocobalamin 1088mcg/1ml IM given L Deltoid.   Tolerated well.  Bandaid applied.

## 2013-01-08 ENCOUNTER — Other Ambulatory Visit: Payer: Self-pay | Admitting: Internal Medicine

## 2013-01-08 DIAGNOSIS — E785 Hyperlipidemia, unspecified: Secondary | ICD-10-CM

## 2013-01-08 DIAGNOSIS — T887XXA Unspecified adverse effect of drug or medicament, initial encounter: Secondary | ICD-10-CM

## 2013-01-11 ENCOUNTER — Other Ambulatory Visit (INDEPENDENT_AMBULATORY_CARE_PROVIDER_SITE_OTHER): Payer: Medicare PPO

## 2013-01-11 DIAGNOSIS — T887XXA Unspecified adverse effect of drug or medicament, initial encounter: Secondary | ICD-10-CM

## 2013-01-11 DIAGNOSIS — E785 Hyperlipidemia, unspecified: Secondary | ICD-10-CM

## 2013-01-16 ENCOUNTER — Telehealth: Payer: Self-pay | Admitting: *Deleted

## 2013-01-16 NOTE — Telephone Encounter (Signed)
Spoke with the pt and she was given her recent lab results and she understood.  Also informed the pt that she will be receiving her lab result through the mail.//AB/CMA

## 2013-01-16 NOTE — Telephone Encounter (Signed)
Message copied by Harl Bowie on Mon Jan 16, 2013  9:56 AM ------      Message from: Hendricks Limes      Created: Sun Jan 08, 2013  9:34 AM       Calcium channel blocker blood pressure  /cardiac medications such as   verapamil IN COMBINATION with  Statin cholesterol lowering medications such as simvastatin  could possibly increase risk of muscle insult.  CK. is needed prior to refilling the meds.  ------

## 2013-01-18 ENCOUNTER — Ambulatory Visit (INDEPENDENT_AMBULATORY_CARE_PROVIDER_SITE_OTHER): Payer: Medicare PPO | Admitting: Pulmonary Disease

## 2013-01-18 ENCOUNTER — Encounter: Payer: Self-pay | Admitting: Pulmonary Disease

## 2013-01-18 VITALS — BP 138/78 | HR 68 | Temp 98.5°F | Ht 64.0 in | Wt >= 6400 oz

## 2013-01-18 DIAGNOSIS — R0609 Other forms of dyspnea: Secondary | ICD-10-CM

## 2013-01-18 DIAGNOSIS — R0683 Snoring: Secondary | ICD-10-CM

## 2013-01-18 NOTE — Progress Notes (Signed)
Subjective:    Patient ID: April [name], female    DOB: 03/20/1950, 62 y.o.   MRN: [id number]  HPI The patient is a 62 year old female who I've been asked to see for possible obstructive sleep apnea. She has been noted by her husband to have loud snoring, as well as an abnormal breathing pattern during sleep. The patient only awakens during the night one time, and feels that she is rested approximately 4 nights out of 7. However, she has significant sleepiness during the day with inactivity, especially while watching television. However, she has no difficulties reading. The patient currently does not drive. She tells me that her weight is up over the last 2 years, and her Epworth score today is normal at 4.   Sleep Questionnaire What time do you typically go to bed?( Between what hours) 12a 12a at 1550 on 01/18/13 by Virl Cagey, CMA How long does it take you to fall asleep? within minutes depending on how tired within minutes depending on how tired at 1550 on 01/18/13 by Virl Cagey, CMA How many times during the night do you wake up? 1 1 at 1550 on 01/18/13 by Virl Cagey, CMA What time do you get out of bed to start your day? 0900 0900 at 1550 on 01/18/13 by Virl Cagey, CMA Do you drive or operate heavy machinery in your occupation? No No at 1550 on 01/18/13 by Virl Cagey, CMA How much has your weight changed (up or down) over the past two years? (In pounds) 20 lb (9.072 kg)20 lb (9.072 kg) 10-20lb increase/decrease at 1550 on 01/18/13 by Virl Cagey, CMA Have you ever had a sleep study before? No No at 1550 on 01/18/13 by Virl Cagey, CMA Do you currently use CPAP? No No at 1550 on 01/18/13 by Virl Cagey, CMA Do you wear oxygen at any time? No No at 1550 on 01/18/13 by Virl Cagey, CMA   Review of Systems  Constitutional: Positive for unexpected weight change. Negative for fever.  HENT: Negative for congestion, dental problem, ear pain,  nosebleeds, postnasal drip, rhinorrhea, sinus pressure, sneezing, sore throat and trouble swallowing.   Eyes: Negative for redness and itching.  Respiratory: Positive for shortness of breath. Negative for cough, chest tightness and wheezing.   Cardiovascular: Negative for palpitations and leg swelling.  Gastrointestinal: Negative for nausea and vomiting.  Genitourinary: Negative for dysuria.  Musculoskeletal: Positive for joint swelling.  Skin: Negative for rash.  Neurological: Negative for headaches.  Hematological: Does not bruise/bleed easily.  Psychiatric/Behavioral: Negative for dysphoric mood. The patient is not nervous/anxious.        Objective:   Physical Exam Constitutional:  Morbidly obese, no acute distress  HENT:  Nares patent without discharge, +turbinate hypertrophy  Oropharynx without exudate, palate and uvula are mildly elongated.    Eyes:  Perrla, eomi, no scleral icterus  Neck:  No JVD, no TMG  Cardiovascular:  Normal rate, regular rhythm, no rubs or gallops.  No murmurs        Intact distal pulses  Pulmonary :  Normal breath sounds, no stridor or respiratory distress   No rales, rhonchi, or wheezing  Abdominal:  Soft, nondistended, bowel sounds present.  No tenderness noted.   Musculoskeletal:  3+ lower extremity edema noted.  Lymph Nodes:  No cervical lymphadenopathy noted  Skin:  No cyanosis noted  Neurologic: appears sleepy but appropriate, moves all 4 extremities without obvious deficit.  Assessment & Plan:

## 2013-01-18 NOTE — Assessment & Plan Note (Signed)
The patient is morbidly obese, and gives a history that is somewhat suggestive of having obstructive sleep apnea. I have explained to her that I cannot say for sure that she has sleep apnea, but she is at extremely high risk and has significant underlying comorbid medical issues that can be affected by sleep disordered breathing.  I have recommended a sleep study for evaluation at this time, and the patient is agreeable.

## 2013-01-18 NOTE — Patient Instructions (Signed)
Will schedule for a sleep study, and arrange followup once the results are available.  Work on weight reduction.

## 2013-02-03 ENCOUNTER — Ambulatory Visit: Payer: Self-pay

## 2013-02-06 ENCOUNTER — Ambulatory Visit (INDEPENDENT_AMBULATORY_CARE_PROVIDER_SITE_OTHER): Payer: Medicare HMO | Admitting: *Deleted

## 2013-02-06 DIAGNOSIS — E538 Deficiency of other specified B group vitamins: Secondary | ICD-10-CM

## 2013-02-06 NOTE — Progress Notes (Signed)
Pt here for B 12 injection.  Under aseptic technique cyanocobalamin 1000mcg/1ml IM given R deltoid.  Tolerated well.  Bandaid applied.  

## 2013-02-07 ENCOUNTER — Other Ambulatory Visit: Payer: Self-pay | Admitting: Internal Medicine

## 2013-02-07 ENCOUNTER — Telehealth: Payer: Self-pay | Admitting: Pulmonary Disease

## 2013-02-07 DIAGNOSIS — Z1231 Encounter for screening mammogram for malignant neoplasm of breast: Secondary | ICD-10-CM

## 2013-02-07 NOTE — Telephone Encounter (Signed)
I spoke with pt. She reports someone was suppose to call her back and let her know how much she was going to have to pay regarding her sleep study and how much her insurance would pay. Please advise PCC's thanks

## 2013-02-08 NOTE — Telephone Encounter (Signed)
Spoke to insurance pt has $40 copay and no deductable and she is aware Joellen [place]

## 2013-02-09 ENCOUNTER — Encounter (HOSPITAL_COMMUNITY): Payer: Medicare PPO

## 2013-02-09 ENCOUNTER — Ambulatory Visit (HOSPITAL_COMMUNITY)
Admission: RE | Admit: 2013-02-09 | Discharge: 2013-02-09 | Disposition: A | Discharge status: Home / Self Care | Payer: Medicare PPO | Source: Ambulatory Visit | Attending: Internal Medicine | Admitting: Internal Medicine

## 2013-02-09 ENCOUNTER — Encounter (HOSPITAL_COMMUNITY): Payer: Self-pay

## 2013-02-09 DIAGNOSIS — I10 Essential (primary) hypertension: Secondary | ICD-10-CM

## 2013-02-09 DIAGNOSIS — I2510 Atherosclerotic heart disease of native coronary artery without angina pectoris: Secondary | ICD-10-CM | POA: Insufficient documentation

## 2013-02-09 MED ORDER — HYDRALAZINE HCL 50 MG PO TABS: ORAL_TABLET | ORAL | Status: DC

## 2013-02-09 NOTE — Patient Instructions (Signed)
Increase hydralazine to 50mg  twice daily  Follow up in 1 year.

## 2013-02-09 NOTE — Progress Notes (Signed)
Patient ID: April [name], female   DOB: 1950/07/30, 63 y.o.   MRN: [id number] PCP: Dr. Linna Darner Pulmonology: Dr Gwenette Greet  HPI: [name] is a very pleasant 63 year old woman with history of morbid obesity (BMI 70), DM2, hypertension, hyperlipidemia, and mild-to- moderate coronary artery disease.  She underwent catheterization in February 2008, which showed 50-60% LAD lesion and 30% RCA lesion.  EF was 70%.  She had mild pulmonary hypertension, primarily due to high output state.  Her mean pulmonary artery pressure was 27, wedge pressure was 10.  PVR was 1.80 Woods units.  09/2010 ECHO EF 60-65% RV normal  She returns for follow up with her husband. Overall she complains of fatigue. SOB with exertion. Denies PND/CP. + Orthopnea sleeps on 2 pillows. Snores a lot. She will have sleep study 02/22/13. Compliant with medications. Not exercising much or dieting. Mild LE edema.    SH: Lives with her husband FH:   ROS: All systems negative except as listed in HPI, PMH and Problem List.  Past Medical History  Diagnosis Date  . Meralgia paresthetica     Dr. [name]  . CTS (carpal tunnel syndrome)   . CAD (coronary artery disease)   . Osteoarthrosis, unspecified whether generalized or localized, lower leg   . Diabetes mellitus   . Hypertension   . Hyperlipidemia   . Morbid obesity   . PUD (peptic ulcer disease)   . Unspecified hereditary and idiopathic peripheral neuropathy   . Morbid obesity   . Type II or unspecified type diabetes mellitus without mention of complication, not stated as uncontrolled   . Carpal tunnel syndrome   . Asthma   . Colon polyp, hyperplastic 2007 & 2012    Current Outpatient Prescriptions  Medication Sig Dispense Refill  . albuterol (PROVENTIL HFA) 108 (90 BASE) MCG/ACT inhaler Inhale 2 puffs into the lungs every 4 (four) hours as needed for wheezing or shortness of breath.  1 Inhaler  11  . amLODipine (NORVASC) 5 MG tablet Take 1 tablet (5 mg total) by mouth daily. If BP  not < 140/90 with increased BP meds  90 tablet  3  . aspirin (ECOTRIN LOW STRENGTH) 81 MG EC tablet Take 81 mg by mouth daily.        . budesonide-formoterol (SYMBICORT) 160-4.5 MCG/ACT inhaler Inhale 2 puffs into the lungs 2 (two) times daily. Gargle after use  1 Inhaler  4  . carvedilol (COREG) 25 MG tablet TAKE 1 TABLET BY MOUTH TWICE A DAY      . Cod Liver Oil CAPS Take 2 capsules by mouth daily.      . DULoxetine (CYMBALTA) 60 MG capsule Take 1 capsule (60 mg total) by mouth daily.  30 capsule  12  . Flaxseed, Linseed, (FLAX SEED OIL) 1000 MG CAPS Take by mouth 3 (three) times daily.        . fluticasone (FLONASE) 50 MCG/ACT nasal spray Place 1 spray into the nose 2 (two) times daily as needed for rhinitis.  16 g  2  . furosemide (LASIX) 40 MG tablet Take 1 tablet (40 mg total) by mouth as needed.  90 tablet  1  . glucose blood (ONE TOUCH ULTRA TEST) test strip Use as instructed  100 each  3  . hydrALAZINE (APRESOLINE) 25 MG tablet take 1 tablet by mouth twice a day  180 tablet  3  . HYDROcodone-homatropine (HYDROMET) 5-1.5 MG/5ML syrup Take 5 mLs by mouth at bedtime as needed for cough. 1 teaspoon every  6-8 hours as needed  120 mL  0  . Lancets (ONETOUCH ULTRASOFT) lancets Use as instructed  100 each  3  . Multiple Vitamin (MULTIVITAMIN) tablet Take 1 tablet by mouth daily.        . Omega-3 Fatty Acids (FISH OIL) 1000 MG CAPS Take by mouth daily.        . simvastatin (ZOCOR) 40 MG tablet 1 by mouth daily  90 tablet  3  . spironolactone (ALDACTONE) 25 MG tablet take 1 tablet by mouth twice a day  180 tablet  3  . topiramate (TOPAMAX) 100 MG tablet Take 1 tablet (100 mg total) by mouth 2 (two) times daily.  60 tablet  12  . verapamil (VERELAN PM) 360 MG 24 hr capsule 1 by mouth daily  90 capsule  3   Current Facility-Administered Medications  Medication Dose Route Frequency Provider Last Rate Last Dose  . cyanocobalamin ((VITAMIN B-12)) injection 1,000 mcg  1,000 mcg Intramuscular Q30 days  Marcial Pacas, MD   1,000 mcg at 02/06/13 0957     PHYSICAL EXAM: Filed Vitals:   02/09/13 1133  BP: 160/102  Pulse: 83  Resp: 22  SpO2: 93%    General:   She is a morbidly obese woman who ambulates around the clinic very slowly with a walker.  +dyspnea HEENT:  Normal NECK:  Thick. Hard to assess JVD Carotids are 2+ bilaterally. No bruits.  CARDIAC:  She has distant heart sounds with a regular rate and rhythm.  No murmur LUNGS:  Clear. ABDOMEN:  Morbidly obese.  Unable to appreciate for any organomegaly. No bruits or masses appreciated.  Good bowel sounds. EXTREMITIES:  Warm with no cyanosis or clubbing.  Trace-1+ edema. R>L NEURO:  She is alert and oriented x3.  Cranial nerves 2-12 are intact. Moves all 4 extremities.  ASSESSMENT & PLAN:  1. CAD - mild to moderate. No angina. Continue ASA and statin per Dr. Linna Darner.  2. Obesity- Reinforced portion control. Encouraged to start exercising.   3. Snores- Day time fatigue. Sleep study scheduled for later this month  4. HTN - uncontrolled. Will double hydralazine to 50 bid  AMY CLEGG, NP-C  Patient seen and examined with Darrick Grinder, NP. We discussed all aspects of the encounter. I agree with the assessment and plan as stated above.   Major issue remains her severe obesity. We discussed that the other thing that will really make a difference at this point is significant calore restriction. We discussed the Atkins diet. Agree with increasing hydralazine for HTN and f/u with sleep study team.   Benay Spice 12:21 AM

## 2013-02-10 ENCOUNTER — Telehealth: Payer: Self-pay | Admitting: Internal Medicine

## 2013-02-10 ENCOUNTER — Other Ambulatory Visit: Payer: Self-pay | Admitting: Internal Medicine

## 2013-02-10 DIAGNOSIS — E8881 Metabolic syndrome: Secondary | ICD-10-CM

## 2013-02-10 NOTE — Telephone Encounter (Signed)
Please advise.//AB/CMA 

## 2013-02-10 NOTE — Telephone Encounter (Signed)
Patient is calling to request a referral to see the nutrition center so that she can inquire about a diabetes portion control plate. Please advise.

## 2013-02-16 ENCOUNTER — Encounter (HOSPITAL_COMMUNITY): Payer: Medicare PPO

## 2013-02-22 ENCOUNTER — Ambulatory Visit (HOSPITAL_BASED_OUTPATIENT_CLINIC_OR_DEPARTMENT_OTHER): Payer: Medicare PPO | Attending: Pulmonary Disease | Admitting: Radiology

## 2013-02-22 VITALS — Ht 64.0 in | Wt >= 6400 oz

## 2013-02-22 DIAGNOSIS — R0989 Other specified symptoms and signs involving the circulatory and respiratory systems: Secondary | ICD-10-CM | POA: Insufficient documentation

## 2013-02-22 DIAGNOSIS — R0609 Other forms of dyspnea: Secondary | ICD-10-CM | POA: Insufficient documentation

## 2013-02-22 DIAGNOSIS — G4733 Obstructive sleep apnea (adult) (pediatric): Secondary | ICD-10-CM | POA: Insufficient documentation

## 2013-02-22 DIAGNOSIS — R0683 Snoring: Secondary | ICD-10-CM

## 2013-02-22 DIAGNOSIS — Z6841 Body Mass Index (BMI) 40.0 and over, adult: Secondary | ICD-10-CM | POA: Insufficient documentation

## 2013-02-24 ENCOUNTER — Encounter (INDEPENDENT_AMBULATORY_CARE_PROVIDER_SITE_OTHER): Payer: Self-pay

## 2013-02-24 ENCOUNTER — Encounter: Payer: Self-pay | Admitting: Nurse Practitioner

## 2013-02-24 ENCOUNTER — Ambulatory Visit (INDEPENDENT_AMBULATORY_CARE_PROVIDER_SITE_OTHER): Payer: Medicare HMO | Admitting: Nurse Practitioner

## 2013-02-24 VITALS — BP 171/79 | HR 80

## 2013-02-24 DIAGNOSIS — E538 Deficiency of other specified B group vitamins: Secondary | ICD-10-CM

## 2013-02-24 NOTE — Patient Instructions (Addendum)
Continue  B12 injections Check B12 level today Continue Topamax and Cymbalta Begin folic acid daily Follow up in 6 months

## 2013-02-24 NOTE — Progress Notes (Signed)
[facility]  PATIENT: April [name] DOB: 11-Jun-1950   REASON FOR VISIT: Followup for numbness and muscle cramping   HISTORY OF PRESENT ILLNESS: April [name], 63 year old morbidly obese female returns for followup. She was last seen by Dr. [name] on 10/25/2012 and evaluated for bilateral muscle cramping and feet numbness. TSH, CPK, magnesium and A1c were all normal. B12 level was 183. She was started on B12 injections. She claims her cramping sensations are a little better she is not taking the folic acid as directed EMG nerve conduction study 03/03/2011 with evidence of length dependent mild axonal peripheral neuropathy consistent with her history of diabetes. There is also evidence of moderate bilateral median neuropathy across the wrist consistent with carpal tunnel syndrome. She returns for reevaluation   HISTORY: evaluation of bilateral muscle cramping.  She had past medical history of obesity, diabetes, hyperlipidemia, hypertension, presenting with bilateral feet paresthesia muscle cramping. Dr. [name]  saw her previously for diabetic peripheral neuropathy, she complained of bilateral knee pain, bilateral feet paresthesia, difficulty bearing weight, gait difficulty, EMG nerve conduction study in January 2013 confirmed the diagnosis of axonal peripheral neuropathy,  She has been taking Topamax 100 mg twice a day for bilateral feet paresthesia, previous laboratory evaluation demonstrated normal sodium, potassium, magnesium, CPK of 92.  She continued to suffer severe morbid obesity, continued to have low back pain, gait difficulty, bilateral knee pain, recent in one year, she also began to suffer bilateral feet muscle cramping, intermittent, severe, lasting 7-8 minutes, happen about once a week. It often involves her feet muscles, also involves her thigh muscles.  She continued to have significant obesity, gait difficulty.   REVIEW OF SYSTEMS: Full 14 system review of systems  performed and notable only for those listed, all others are neg:  Constitutional: N/A  Cardiovascular: N/A  Ear/Nose/Throat: N/A  Skin: N/A  Eyes: N/A  Respiratory: Cough, wheezing  Gastroitestinal: N/A  Hematology/Lymphatic: Easy bruising Endocrine: N/A Musculoskeletal: Walking difficulty Allergy/Immunology: N/A  Neurological: Numbness in the feet  Psychiatric: N/A   ALLERGIES: Allergies  Allergen Reactions  . Penicillins     REACTION: rash  . Benazepril Hcl   . Hydrochlorothiazide W-Triamterene     REACTION: hypokalemia  . Metronidazole   . Spironolactone     REACTION: kidney problems  . Sulfonamide Derivatives     REACTION: internal "burning"  . Torsemide   . Valsartan     HOME MEDICATIONS: Outpatient Prescriptions Prior to Visit  Medication Sig Dispense Refill  . albuterol (PROVENTIL HFA) 108 (90 BASE) MCG/ACT inhaler Inhale 2 puffs into the lungs every 4 (four) hours as needed for wheezing or shortness of breath.  1 Inhaler  11  . amLODipine (NORVASC) 5 MG tablet Take 1 tablet (5 mg total) by mouth daily. If BP not < 140/90 with increased BP meds  90 tablet  3  . aspirin (ECOTRIN LOW STRENGTH) 81 MG EC tablet Take 81 mg by mouth daily.        . budesonide-formoterol (SYMBICORT) 160-4.5 MCG/ACT inhaler Inhale 2 puffs into the lungs 2 (two) times daily. Gargle after use  1 Inhaler  4  . carvedilol (COREG) 25 MG tablet TAKE 1 TABLET BY MOUTH TWICE A DAY      . Cod Liver Oil CAPS Take 2 capsules by mouth daily.      . DULoxetine (CYMBALTA) 60 MG capsule Take 1 capsule (60 mg total) by mouth daily.  30 capsule  12  . Flaxseed, Linseed, (FLAX SEED  OIL) 1000 MG CAPS Take by mouth 3 (three) times daily.        . fluticasone (FLONASE) 50 MCG/ACT nasal spray Place 1 spray into the nose 2 (two) times daily as needed for rhinitis.  16 g  2  . furosemide (LASIX) 40 MG tablet Take 1 tablet (40 mg total) by mouth as needed.  90 tablet  1  . glucose blood (ONE TOUCH ULTRA TEST)  test strip Use as instructed  100 each  3  . hydrALAZINE (APRESOLINE) 50 MG tablet Take 1 tab by mouth twice a day  180 tablet  3  . HYDROcodone-homatropine (HYDROMET) 5-1.5 MG/5ML syrup Take 5 mLs by mouth at bedtime as needed for cough. 1 teaspoon every 6-8 hours as needed  120 mL  0  . Lancets (ONETOUCH ULTRASOFT) lancets Use as instructed  100 each  3  . Multiple Vitamin (MULTIVITAMIN) tablet Take 1 tablet by mouth daily.        . Omega-3 Fatty Acids (FISH OIL) 1000 MG CAPS Take by mouth daily.        . simvastatin (ZOCOR) 40 MG tablet 1 by mouth daily  90 tablet  3  . spironolactone (ALDACTONE) 25 MG tablet take 1 tablet by mouth twice a day  180 tablet  3  . topiramate (TOPAMAX) 100 MG tablet Take 1 tablet (100 mg total) by mouth 2 (two) times daily.  60 tablet  12  . verapamil (VERELAN PM) 360 MG 24 hr capsule 1 by mouth daily  90 capsule  3   Facility-Administered Medications Prior to Visit  Medication Dose Route Frequency Provider Last Rate Last Dose  . cyanocobalamin ((VITAMIN B-12)) injection 1,000 mcg  1,000 mcg Intramuscular Q30 days Marcial Pacas, MD   1,000 mcg at 02/06/13 0957    PAST MEDICAL HISTORY: Past Medical History  Diagnosis Date  . Meralgia paresthetica     Dr. [name]  . CTS (carpal tunnel syndrome)   . CAD (coronary artery disease)   . Osteoarthrosis, unspecified whether generalized or localized, lower leg   . Diabetes mellitus   . Hypertension   . Hyperlipidemia   . Morbid obesity   . PUD (peptic ulcer disease)   . Unspecified hereditary and idiopathic peripheral neuropathy   . Morbid obesity   . Type II or unspecified type diabetes mellitus without mention of complication, not stated as uncontrolled   . Carpal tunnel syndrome   . Asthma   . Colon polyp, hyperplastic 2007 & 2012    PAST SURGICAL HISTORY: Past Surgical History  Procedure Laterality Date  . Abdominal hysterectomy    . Dilation and curettage of uterus      multiple  . Tonsillectomy and  adenoidectomy    . Cardiac catheterization  2002    non obstructive disease  . Renal calculi  12/1997    SVT with induction of anesthesia  . Colonoscopy with polypectomy  2007 & 2012     hyperplastic ;Dr Watt Climes    FAMILY HISTORY: Family History  Problem Relation Age of Onset  . Colon cancer Mother   . Prostate cancer Father   . Colon cancer Father   . Diabetes Maternal Aunt   . Diabetes Maternal Uncle   . Diabetes Paternal Aunt   . Stroke Paternal Aunt   . Heart disease Paternal Aunt   . Diabetes Paternal Uncle   . Breast cancer Maternal Aunt      X 2    SOCIAL HISTORY: History  Social History  . Marital Status: Married    Spouse Name: [name]    Number of Children: 1  . Years of Education: BS   Occupational History  . Disabled    Social History Main Topics  . Smoking status: Never Smoker   . Smokeless tobacco: Never Used  . Alcohol Use: No  . Drug Use: No  . Sexual Activity: Not on file   Other Topics Concern  . Not on file   Social History Narrative   Patient lives at home with her husband [name] Patient is retired and has a Conservation officer, nature.    Caffeine - some times.   Right handed.     PHYSICAL EXAM  Filed Vitals:   02/24/13 1018  BP: 171/79  Pulse: 80   Body mass index is 0.00 kg/(m^2).  Generalized: Well developed, morbidly obese female in no acute distress  Head: normocephalic and atraumatic,. Oropharynx benign  Neck: Supple, no carotid bruits  Cardiac: Regular rate rhythm, no murmur  Musculoskeletal: No deformity   Neurological examination   Mentation: Alert oriented to time, place, history taking. Follows all commands speech and language fluent  Cranial nerve II-XII: Fundoscopic exam reveals sharp disc margins.Pupils were equal round reactive to light extraocular movements were full, visual field were full on confrontational test. Facial sensation and strength were normal. hearing was intact to finger rubbing bilaterally. Uvula  tongue midline. head turning and shoulder shrug were normal and symmetric.Tongue protrusion into cheek strength was normal. Motor: normal bulk and tone, full strength in the BUE, BLE,  No focal weakness Sensory: Length dependent decrease to touch, pinprick and global, preserved vibratory sensation and proprioception  Coordination: finger-nose-finger, heel-to-shin bilaterally, no dysmetria Reflexes: Brachioradialis 2/2, biceps 2/2, triceps 2/2, patellar 2/2, Achilles 2/2, plantar responses were flexor bilaterally. Gait and Station: Rising up from seated position pushing off, cautious unsteady gait with rolling walker  DIAGNOSTIC DATA (LABS, IMAGING, TESTING) - I reviewed patient records, labs, notes, testing and imaging myself where available.  Lab Results  Component Value Date   WBC 5.0 10/10/2012   HGB 12.1 10/10/2012   HCT 37.0 10/10/2012   MCV 88.0 10/10/2012   PLT 174.0 10/10/2012      Component Value Date/Time   NA 145* 10/25/2012 1117   NA 142 01/04/2012 0941   K 4.2 10/25/2012 1117   CL 105 10/25/2012 1117   CO2 27 10/25/2012 1117   GLUCOSE 105* 10/25/2012 1117   GLUCOSE 121* 01/04/2012 0941   BUN 24 10/25/2012 1117   BUN 18 01/04/2012 0941   CREATININE 1.01* 10/25/2012 1117   CALCIUM 9.4 10/25/2012 1117   PROT 6.4 10/25/2012 1117   PROT 6.6 10/10/2012 0841   ALBUMIN 3.5 10/10/2012 0841   AST 12 10/25/2012 1117   ALT 8 10/25/2012 1117   ALKPHOS 73 10/25/2012 1117   BILITOT 0.3 10/25/2012 1117   GFRNONAA 60 10/25/2012 1117   GFRAA 69 10/25/2012 1117   Lab Results  Component Value Date   CHOL 143 10/10/2012   HDL 38.30* 10/10/2012   LDLCALC 95 10/10/2012   LDLDIRECT 169.4 05/02/2007   TRIG 47.0 10/10/2012   CHOLHDL 4 10/10/2012   Lab Results  Component Value Date   HGBA1C 6.0* 10/25/2012   Lab Results  Component Value Date   VITAMINB12 189* 11/03/2012   Lab Results  Component Value Date   TSH 3.100 10/25/2012      ASSESSMENT AND PLAN  62 y.o. year old female  has a past  medical history of  Meralgia paresthetica; CTS (carpal tunnel syndrome); CAD (coronary artery disease); Osteoarthrosis, unspecified whether generalized or localized, lower leg; Diabetes mellitus; Hypertension; Hyperlipidemia; Morbid obesity; PUD (peptic ulcer disease); Unspecified hereditary and idiopathic peripheral neuropathy; Morbid obesity; Type II or unspecified type diabetes mellitus without mention of complication, not stated as uncontrolled; Carpal tunnel syndrome; Asthma; and Colon polyp, hyperplastic (2007 & 2012). here to followup. Most  recent vitamin B12 level 189 (3)  months ago. Has been receiving vitamin B12 injections.  Continue  B12 injections Check B12 level today Continue Topamax and Cymbalta Begin folic acid daily for elevated homocystine  Follow up in 6 months Dennie Bible, [facility], [place], APRN  [facility] [address] [place], [place] [address] [phone]

## 2013-02-25 LAB — VITAMIN B12: Vitamin B-12: 622 pg/mL (ref 211–946)

## 2013-03-01 ENCOUNTER — Telehealth: Payer: Self-pay | Admitting: Pulmonary Disease

## 2013-03-01 DIAGNOSIS — R0989 Other specified symptoms and signs involving the circulatory and respiratory systems: Secondary | ICD-10-CM

## 2013-03-01 DIAGNOSIS — G4730 Sleep apnea, unspecified: Secondary | ICD-10-CM

## 2013-03-01 DIAGNOSIS — R0609 Other forms of dyspnea: Secondary | ICD-10-CM

## 2013-03-01 DIAGNOSIS — G4710 Hypersomnia, unspecified: Secondary | ICD-10-CM

## 2013-03-01 NOTE — Sleep Study (Signed)
   NAME: April [name] DATE OF BIRTH:  December 16, 1950 MEDICAL RECORD NUMBER [id number]  LOCATION: [facility]  PHYSICIAN: [place] OF STUDY: 02/22/2013  SLEEP STUDY TYPE: Nocturnal Polysomnogram               REFERRING PHYSICIAN: Markeese Boyajian, Armando Reichert, MD  INDICATION FOR STUDY: Hypersomnia with sleep apnea  EPWORTH SLEEPINESS SCORE:  3 HEIGHT: 5\' 4"  (162.6 cm)  WEIGHT: 413 lb (187.336 kg)    Body mass index is 70.86 kg/(m^2).  NECK SIZE: 14.5 in.  SLEEP ARCHITECTURE: The patient had a total sleep time of 305 minutes with no slow-wave sleep and only 40 minutes of REM. Sleep onset latency was prolonged at 61 minutes, and REM onset was very prolonged at 263 minutes. Sleep efficiency was poor at 69%.  RESPIRATORY DATA: The patient was found to have 9 apneas and 41 obstructive hypopneas, giving her an AHI of 10 events per hour. The events occurred in all body positions, but were increased during REM. Loud snoring was noted throughout.  OXYGEN DATA: The patient had oxygen desaturation as low as 66% with her events during REM.  CARDIAC DATA: Occasional PAC noted.  MOVEMENT/PARASOMNIA: No significant limb movements or other abnormal behaviors were seen.  IMPRESSION/ RECOMMENDATION:    1) mild obstructive sleep apnea, with an AHI of 10 events per hour and oxygen desaturation transiently as low as 66%. Treatment for this degree of sleep apnea can include a trial of weight loss alone, upper airway surgery, dental appliance, and also CPAP. Clinical correlation is suggested.  2) occasional PAC noted, but no clinically significant arrhythmias were seen.     [place], American Board of Sleep Medicine  ELECTRONICALLY SIGNED ON:  03/01/2013, 1:43 PM [place] PH: [phone]   FX: [phone] [place]

## 2013-03-01 NOTE — Telephone Encounter (Signed)
Pt needs ov to review sleep study results.  

## 2013-03-02 NOTE — Telephone Encounter (Signed)
LMOM x 1 

## 2013-03-03 NOTE — Telephone Encounter (Signed)
Appointment scheduled.April [name]

## 2013-03-06 ENCOUNTER — Ambulatory Visit (INDEPENDENT_AMBULATORY_CARE_PROVIDER_SITE_OTHER): Payer: Medicare HMO | Admitting: Neurology

## 2013-03-06 DIAGNOSIS — E538 Deficiency of other specified B group vitamins: Secondary | ICD-10-CM

## 2013-03-06 NOTE — Progress Notes (Signed)
Patient here for B12 injection.  Under aseptic technique Cyanocobalamin 1066mcg/1ml given IM in left deltoid.  Tolerated well.  Band-Aid applied.

## 2013-03-06 NOTE — Patient Instructions (Signed)
Patient will return Feb 2 for B12 injection.

## 2013-03-10 ENCOUNTER — Ambulatory Visit (HOSPITAL_COMMUNITY)
Admission: RE | Admit: 2013-03-10 | Discharge: 2013-03-10 | Disposition: A | Discharge status: Home / Self Care | Payer: Medicare PPO | Source: Ambulatory Visit | Attending: Internal Medicine | Admitting: Internal Medicine

## 2013-03-10 DIAGNOSIS — Z1231 Encounter for screening mammogram for malignant neoplasm of breast: Secondary | ICD-10-CM

## 2013-03-17 ENCOUNTER — Ambulatory Visit (INDEPENDENT_AMBULATORY_CARE_PROVIDER_SITE_OTHER): Payer: Medicare PPO | Admitting: Internal Medicine

## 2013-03-17 ENCOUNTER — Encounter: Payer: Self-pay | Admitting: Internal Medicine

## 2013-03-17 VITALS — BP 140/80 | HR 80 | Temp 98.4°F | Resp 16 | Wt >= 6400 oz

## 2013-03-17 DIAGNOSIS — J45901 Unspecified asthma with (acute) exacerbation: Secondary | ICD-10-CM

## 2013-03-17 DIAGNOSIS — J019 Acute sinusitis, unspecified: Secondary | ICD-10-CM

## 2013-03-17 DIAGNOSIS — J219 Acute bronchiolitis, unspecified: Secondary | ICD-10-CM

## 2013-03-17 DIAGNOSIS — J218 Acute bronchiolitis due to other specified organisms: Secondary | ICD-10-CM

## 2013-03-17 MED ORDER — HYDROCODONE-HOMATROPINE 5-1.5 MG/5ML PO SYRP: 5.0000 mL | ORAL_SOLUTION | Freq: Every evening | ORAL | Status: DC | PRN

## 2013-03-17 MED ORDER — BUDESONIDE-FORMOTEROL FUMARATE 160-4.5 MCG/ACT IN AERO: INHALATION_SPRAY | RESPIRATORY_TRACT | Status: DC

## 2013-03-17 MED ORDER — PREDNISONE 20 MG PO TABS: 20.0000 mg | ORAL_TABLET | Freq: Every day | ORAL | Status: DC

## 2013-03-17 MED ORDER — DOXYCYCLINE HYCLATE 100 MG PO TABS
100.0000 mg | ORAL_TABLET | Freq: Two times a day (BID) | ORAL | Status: DC
Start: 2013-03-17 — End: 2013-04-11

## 2013-03-17 NOTE — Progress Notes (Signed)
Pre visit review using our clinic review tool, if applicable. No additional management support is needed unless otherwise documented below in the visit note. 

## 2013-03-17 NOTE — Progress Notes (Signed)
   Subjective:    Patient ID: April [name], female    DOB: 1950-07-30, 63 y.o.   MRN: [id number]  HPI    Review of Systems     Objective:   Physical Exam General appearance: Morbidly obese;adequately nourished; no acute distress or increased work of breathing is present.  No  lymphadenopathy about the head, neck, or axilla noted.   Eyes: No conjunctival inflammation or lid edema is present. There is no scleral icterus.  Ears:  External ear exam shows no significant lesions or deformities.  Otoscopic examination reveals clear canals, tympanic membranes are intact bilaterally without bulging, retraction, inflammation or discharge.  Nose:  External nasal examination shows no deformity or inflammation. Nasal mucosa are pink and moist without lesions or exudates. No septal dislocation or deviation.No obstruction to airflow.   Oral exam: Dental hygiene is good; lips and gums are healthy appearing.There is no oropharyngeal erythema or exudate noted.   Neck:  No deformities,  masses, or tenderness noted.   Supple with full range of motion without pain.   Heart:  Normal rate and regular rhythm. S1 and S2 normal without gallop, murmur, click, rub or other extra sounds.   Lungs:Chest clear to auscultation; no wheezes, rhonchi,rales ,or rubs present.No increased work of breathing.  Upper airway wheezing only with forced expiration  Extremities:  No cyanosis or clubbing  noted . 1+ ankle edema   Skin: Warm & dry .         Assessment & Plan:

## 2013-03-17 NOTE — Progress Notes (Signed)
   Subjective:    Patient ID: April [name], female    DOB: Jul 05, 1950, 63 y.o.   MRN: [id number]  HPI   Symptoms began one week ago as a sore throat described as burning and scratching. Subsequently she developed yellow sputum production associated with any increase in her asthma symptoms prompting use of her albuterol. She's also used narcotic cough syrup.  She also has nasal purulence secretions which are yellow. This is associated with fever and chills as well as a frontal headache. She does have ear pain bilaterally.    Review of Systems She is not having facial pain, dental pain, or otic discharge.       Objective:   Physical Exam General appearance:good health ;well nourished; no acute distress or increased work of breathing is present.  No  lymphadenopathy about the head, neck, or axilla noted.   Eyes: No conjunctival inflammation or lid edema is present. There is no scleral icterus.  Ears:  External ear exam shows no significant lesions or deformities.  Otoscopic examination reveals clear canals, slightly injected tympanic membranes are intact bilaterally without bulging, retraction, or discharge.  Nose:  External nasal examination shows no deformity or inflammation. Nasal mucosa are pink and moist without lesions or exudates. No septal dislocation or deviation.No obstruction to airflow.   Oral exam: Dental hygiene is good; lips and gums are healthy appearing.There is no oropharyngeal erythema or exudate noted.   Neck:  No deformities, thyromegaly, masses, or tenderness noted.   Supple with full range of motion without pain.   Heart:  Normal rate and regular rhythm. S1 and S2 normal without gallop, murmur, click, rub or other extra sounds.   Lungs:Chest clear to auscultation; upper airway expiratory wheezes.  No rhonchi,rales ,or rubs present.No increased work of breathing.    Extremities:  No cyanosis, edema, or clubbing  noted    Skin: Warm & dry w/o jaundice or  tenting.      Assessment & Plan:  #1 rhinosinusitis with bronchitis symptoms & signs  Plan: Nasal hygiene interventions discussed. See prescription medications

## 2013-03-17 NOTE — Addendum Note (Signed)
Addended byHendricks Limes on: 03/17/2013 09:03 AM   Modules accepted: Orders

## 2013-03-17 NOTE — Patient Instructions (Signed)
Plain Mucinex (NOT D) for thick secretions ;force NON dairy fluids .   Nasal cleansing in the shower as discussed with lather of mild shampoo.After 10 seconds wash off lather while  exhaling through nostrils. Make sure that all residual soap is removed to prevent irritation.  Flonase OR Nasacort AQ 1 spray in each nostril twice a day as needed. Use the "crossover" technique into opposite nostril spraying toward opposite ear @ 45 degree angle, not straight up into nostril.  Use a Neti pot daily only  as needed for significant sinus congestion; going from open side to congested side . Plain Allegra (NOT D )  160 daily , Loratidine 10 mg , OR Zyrtec 10 mg @ bedtime  as needed for itchy eyes & sneezing. 

## 2013-03-27 ENCOUNTER — Other Ambulatory Visit: Payer: Self-pay | Admitting: Internal Medicine

## 2013-03-27 DIAGNOSIS — I10 Essential (primary) hypertension: Secondary | ICD-10-CM

## 2013-03-27 DIAGNOSIS — E785 Hyperlipidemia, unspecified: Secondary | ICD-10-CM

## 2013-03-27 NOTE — Telephone Encounter (Signed)
Refill for zocor and verapamil sent to [facility]

## 2013-03-29 ENCOUNTER — Ambulatory Visit: Payer: Medicare PPO | Admitting: Pulmonary Disease

## 2013-04-03 ENCOUNTER — Ambulatory Visit (INDEPENDENT_AMBULATORY_CARE_PROVIDER_SITE_OTHER): Payer: Medicare HMO | Admitting: Neurology

## 2013-04-03 DIAGNOSIS — E538 Deficiency of other specified B group vitamins: Secondary | ICD-10-CM

## 2013-04-03 NOTE — Patient Instructions (Signed)
Patient will return next month for B12 injection. 

## 2013-04-03 NOTE — Progress Notes (Signed)
Patient here for B12 injection.  Under aseptic technique Cyanocobalamin given IM in right deltoid.  Tolerated well.  Band-Aid applied.

## 2013-04-11 ENCOUNTER — Ambulatory Visit (INDEPENDENT_AMBULATORY_CARE_PROVIDER_SITE_OTHER): Payer: Medicare PPO | Admitting: Pulmonary Disease

## 2013-04-11 ENCOUNTER — Encounter: Payer: Self-pay | Admitting: Pulmonary Disease

## 2013-04-11 VITALS — BP 138/80 | HR 79 | Temp 98.3°F | Ht 64.0 in | Wt >= 6400 oz

## 2013-04-11 DIAGNOSIS — G4733 Obstructive sleep apnea (adult) (pediatric): Secondary | ICD-10-CM

## 2013-04-11 NOTE — Patient Instructions (Signed)
Think about conservative treatment with a trial of weight loss over the next 49mos, versus starting on cpap.  Let me know what you decide.  If you take the more conservative path, and are not able to lose 25 pounds in the first 84mos, would consider more aggressive treatment.

## 2013-04-11 NOTE — Assessment & Plan Note (Signed)
The pt was found to have mild osa by her recent sleep study.  I have outlined a conservative treatment approach with a trial of weight loss over the next 74mos, versus more aggressive treatment with cpap.  She would like to think about this, and will let me know her decision.

## 2013-04-11 NOTE — Progress Notes (Signed)
   Subjective:    Patient ID: April [name], female    DOB: Mar 27, 1950, 63 y.o.   MRN: [id number]  HPI The patient comes in today for followup of her recent sleep study. She was found to have mild OSA, with an AHI of 10 events per hour. I have reviewed the study with her in detail, and answered all of her questions.   Review of Systems  Constitutional: Negative for fever and unexpected weight change.  HENT: Negative for congestion, dental problem, ear pain, nosebleeds, postnasal drip, rhinorrhea, sinus pressure, sneezing, sore throat and trouble swallowing.   Eyes: Negative for redness and itching.  Respiratory: Positive for cough, shortness of breath and wheezing. Negative for chest tightness.   Cardiovascular: Positive for leg swelling. Negative for palpitations.  Gastrointestinal: Negative for nausea and vomiting.  Genitourinary: Negative for dysuria.  Musculoskeletal: Negative for joint swelling.  Skin: Negative for rash.  Neurological: Negative for headaches.  Hematological: Does not bruise/bleed easily.  Psychiatric/Behavioral: Negative for dysphoric mood. The patient is not nervous/anxious.        Objective:   Physical Exam Morbidly obese female in no acute distress Nose without purulence or discharge noted Neck without lymphadenopathy or thyromegaly Lower extremities with edema noted, no cyanosis Alert and oriented, moves all 4 extremities.       Assessment & Plan:

## 2013-05-01 ENCOUNTER — Ambulatory Visit: Payer: Medicare HMO

## 2013-05-03 ENCOUNTER — Ambulatory Visit (INDEPENDENT_AMBULATORY_CARE_PROVIDER_SITE_OTHER): Payer: Medicare PPO | Admitting: Neurology

## 2013-05-03 DIAGNOSIS — E538 Deficiency of other specified B group vitamins: Secondary | ICD-10-CM

## 2013-05-03 NOTE — Patient Instructions (Signed)
Patient will return next month for B12 injection. 

## 2013-05-03 NOTE — Progress Notes (Signed)
Patient here for B12 injection.  Under aseptic technique Cyanocobalamin given IM in left deltoid.  Tolerated well.  Band-Aid applied. 

## 2013-05-09 ENCOUNTER — Other Ambulatory Visit: Payer: Self-pay | Admitting: Internal Medicine

## 2013-05-10 NOTE — Telephone Encounter (Signed)
Rx sent to the pharmacy by e-script.//AB/CMA 

## 2013-06-02 ENCOUNTER — Ambulatory Visit (INDEPENDENT_AMBULATORY_CARE_PROVIDER_SITE_OTHER): Payer: Medicare PPO | Admitting: Neurology

## 2013-06-02 DIAGNOSIS — E538 Deficiency of other specified B group vitamins: Secondary | ICD-10-CM

## 2013-06-02 MED ORDER — CYANOCOBALAMIN 1000 MCG/ML IJ SOLN
1000.0000 ug | INTRAMUSCULAR | Status: AC
  Administered 2013-06-02 – 2013-11-02 (×6): 1000 ug via INTRAMUSCULAR

## 2013-06-02 NOTE — Progress Notes (Signed)
Patient here for B12 injection.  Under aseptic technique Cyanocobalamin 1067mcg/1ml given IM in right deltoid.  Tolerated well.  Band-Aid applied.

## 2013-06-02 NOTE — Patient Instructions (Signed)
Patient will return 07-04-13 for next B12 injection.

## 2013-07-04 ENCOUNTER — Ambulatory Visit (INDEPENDENT_AMBULATORY_CARE_PROVIDER_SITE_OTHER): Payer: Medicare PPO | Admitting: Neurology

## 2013-07-04 DIAGNOSIS — E538 Deficiency of other specified B group vitamins: Secondary | ICD-10-CM

## 2013-07-04 NOTE — Progress Notes (Signed)
Patient here for B12 injection.  Under aseptic technique Cyanocobalamin 1081mcg given IM in left deltoid.  Tolerated well.  Band-Aid applied.

## 2013-07-04 NOTE — Patient Instructions (Signed)
Patient will return August 02, 2013 for next B12 injection.

## 2013-08-02 ENCOUNTER — Ambulatory Visit (INDEPENDENT_AMBULATORY_CARE_PROVIDER_SITE_OTHER): Payer: Medicare PPO | Admitting: Neurology

## 2013-08-02 DIAGNOSIS — E538 Deficiency of other specified B group vitamins: Secondary | ICD-10-CM

## 2013-08-02 NOTE — Patient Instructions (Signed)
Patient will return next month for B12 injection. 

## 2013-08-02 NOTE — Progress Notes (Signed)
Patient here for B12 injection.  Under aseptic technique Cyanocobalamin given IM in right deltoid.  Tolerated well.  Band-Aid applied.

## 2013-08-05 ENCOUNTER — Other Ambulatory Visit: Payer: Self-pay | Admitting: Internal Medicine

## 2013-08-05 DIAGNOSIS — R7309 Other abnormal glucose: Secondary | ICD-10-CM

## 2013-08-05 DIAGNOSIS — E785 Hyperlipidemia, unspecified: Secondary | ICD-10-CM

## 2013-08-05 DIAGNOSIS — I10 Essential (primary) hypertension: Secondary | ICD-10-CM

## 2013-08-18 ENCOUNTER — Telehealth: Payer: Self-pay | Admitting: *Deleted

## 2013-08-18 NOTE — Telephone Encounter (Signed)
Spoke with patient to r/s her appointment with NP CM, patient stated that she didn't know that she had an appointment for this day and would have to call back to r/s appointment after she has checked her calendar, informed patient that appointment for 08/24/13 will be cancelled, patient verbalized understanding.

## 2013-08-24 ENCOUNTER — Ambulatory Visit: Payer: Medicare HMO | Admitting: Nurse Practitioner

## 2013-09-05 ENCOUNTER — Ambulatory Visit (INDEPENDENT_AMBULATORY_CARE_PROVIDER_SITE_OTHER): Payer: Medicare PPO | Admitting: *Deleted

## 2013-09-05 ENCOUNTER — Ambulatory Visit (INDEPENDENT_AMBULATORY_CARE_PROVIDER_SITE_OTHER)
Admission: RE | Admit: 2013-09-05 | Discharge: 2013-09-05 | Disposition: A | Discharge status: Home / Self Care | Payer: Medicare PPO | Source: Ambulatory Visit | Attending: Internal Medicine | Admitting: Internal Medicine

## 2013-09-05 ENCOUNTER — Ambulatory Visit (INDEPENDENT_AMBULATORY_CARE_PROVIDER_SITE_OTHER): Payer: Medicare PPO | Admitting: Internal Medicine

## 2013-09-05 ENCOUNTER — Encounter: Payer: Self-pay | Admitting: Internal Medicine

## 2013-09-05 ENCOUNTER — Other Ambulatory Visit: Payer: Medicare PPO

## 2013-09-05 ENCOUNTER — Other Ambulatory Visit (INDEPENDENT_AMBULATORY_CARE_PROVIDER_SITE_OTHER): Payer: Medicare PPO

## 2013-09-05 VITALS — BP 128/92 | HR 75 | Temp 98.3°F | Wt >= 6400 oz

## 2013-09-05 DIAGNOSIS — R7309 Other abnormal glucose: Secondary | ICD-10-CM

## 2013-09-05 DIAGNOSIS — R0600 Dyspnea, unspecified: Secondary | ICD-10-CM

## 2013-09-05 DIAGNOSIS — R110 Nausea: Secondary | ICD-10-CM

## 2013-09-05 DIAGNOSIS — R0609 Other forms of dyspnea: Secondary | ICD-10-CM

## 2013-09-05 DIAGNOSIS — N3289 Other specified disorders of bladder: Secondary | ICD-10-CM

## 2013-09-05 DIAGNOSIS — Z6841 Body Mass Index (BMI) 40.0 and over, adult: Secondary | ICD-10-CM

## 2013-09-05 DIAGNOSIS — R82998 Other abnormal findings in urine: Secondary | ICD-10-CM

## 2013-09-05 DIAGNOSIS — R350 Frequency of micturition: Secondary | ICD-10-CM

## 2013-09-05 DIAGNOSIS — R0989 Other specified symptoms and signs involving the circulatory and respiratory systems: Secondary | ICD-10-CM

## 2013-09-05 DIAGNOSIS — E538 Deficiency of other specified B group vitamins: Secondary | ICD-10-CM

## 2013-09-05 DIAGNOSIS — R3989 Other symptoms and signs involving the genitourinary system: Secondary | ICD-10-CM

## 2013-09-05 DIAGNOSIS — R8290 Unspecified abnormal findings in urine: Secondary | ICD-10-CM

## 2013-09-05 LAB — CBC WITH DIFFERENTIAL/PLATELET
BASOS ABS: 0 10*3/uL (ref 0.0–0.1)
Basophils Relative: 0.6 % (ref 0.0–3.0)
EOS ABS: 0.1 10*3/uL (ref 0.0–0.7)
Eosinophils Relative: 0.9 % (ref 0.0–5.0)
HEMATOCRIT: 40.3 % (ref 36.0–46.0)
Hemoglobin: 12.8 g/dL (ref 12.0–15.0)
LYMPHS ABS: 1.4 10*3/uL (ref 0.7–4.0)
Lymphocytes Relative: 20 % (ref 12.0–46.0)
MCHC: 31.7 g/dL (ref 30.0–36.0)
MCV: 88.4 fl (ref 78.0–100.0)
MONOS PCT: 6.5 % (ref 3.0–12.0)
Monocytes Absolute: 0.4 10*3/uL (ref 0.1–1.0)
Neutro Abs: 4.9 10*3/uL (ref 1.4–7.7)
Neutrophils Relative %: 72 % (ref 43.0–77.0)
PLATELETS: 209 10*3/uL (ref 150.0–400.0)
RBC: 4.56 Mil/uL (ref 3.87–5.11)
RDW: 15.3 % (ref 11.5–15.5)
WBC: 6.8 10*3/uL (ref 4.0–10.5)

## 2013-09-05 LAB — BASIC METABOLIC PANEL
BUN: 17 mg/dL (ref 6–23)
CO2: 30 mEq/L (ref 19–32)
Calcium: 9.1 mg/dL (ref 8.4–10.5)
Chloride: 103 mEq/L (ref 96–112)
Creatinine, Ser: 1 mg/dL (ref 0.4–1.2)
GFR: 68.77 mL/min (ref >60.00)
GLUCOSE: 102 mg/dL — AB (ref 70–99)
POTASSIUM: 3.8 meq/L (ref 3.5–5.1)
Sodium: 142 mEq/L (ref 135–145)

## 2013-09-05 LAB — HEPATIC FUNCTION PANEL
ALBUMIN: 3.8 g/dL (ref 3.5–5.2)
ALT: 14 U/L (ref 0–35)
AST: 16 U/L (ref 0–37)
Alkaline Phosphatase: 68 U/L (ref 39–117)
Bilirubin, Direct: 0.1 mg/dL (ref 0.0–0.3)
Total Bilirubin: 0.7 mg/dL (ref 0.2–1.2)
Total Protein: 7.1 g/dL (ref 6.0–8.3)

## 2013-09-05 LAB — POCT URINALYSIS DIPSTICK
Bilirubin, UA: NEGATIVE
Blood, UA: NEGATIVE
Glucose, UA: NEGATIVE
KETONES UA: NEGATIVE
NITRITE UA: NEGATIVE
PH UA: 6
Spec Grav, UA: 1.02
Urobilinogen, UA: 1

## 2013-09-05 LAB — HEMOGLOBIN A1C: HEMOGLOBIN A1C: 6 % (ref 4.6–6.5)

## 2013-09-05 MED ORDER — RANITIDINE HCL 150 MG PO TABS: 150.0000 mg | ORAL_TABLET | Freq: Two times a day (BID) | ORAL | Status: DC

## 2013-09-05 NOTE — Patient Instructions (Signed)
Your next office appointment will be determined based upon review of your pending labs & x-rays. Those instructions will be transmitted to you  by mail  Followup as needed for your acute issue. Please report any significant change in your symptoms. Reflux of gastric acid may be asymptomatic as this may occur mainly during sleep.It can present as nausea.The triggers for reflux  include stress; the "aspirin family" ; alcohol; peppermint; and caffeine (coffee, tea, cola, and chocolate). The aspirin family would include aspirin and the nonsteroidal agents such as ibuprofen &  Naproxen. Tylenol would not cause reflux. If having symptoms ; food & drink should be avoided for @ least 2 hours before going to bed.

## 2013-09-05 NOTE — Progress Notes (Signed)
   Subjective:    Patient ID: April [name], female    DOB: 06-24-1950, 63 y.o.   MRN: [id number]  HPI  Beginning in July she has had intermittent nausea with some associated shortness of breath. She describes sensation of pressure against her bladder.  The nausea occurs 3-4 times per week and lasts 45-60 minutes.  Symptoms are associated with increased belching and gas.  She has frequent urination.She also describes nocturia 2-3 times per night  She has some chills and sweats.  Her fasting blood sugars range 95-102. Last A1c 6.0% 10/25/12.She has no hypoglycemia. She describes chronic right thigh neuropathy for which she sees Dr. [name].  She is a dry cough with intermittent minor wheezing.    Review of Systems Unexplained weight loss, abdominal pain, significant dyspepsia, dysphagia, melena, rectal bleeding, or persistently small caliber stools are denied. Frontal headache, facial pain , nasal purulence, dental pain, sore throat , otic pain or otic discharge denied. No fever , chills or sweats. No polydipsia, polyuria or polyphagia. She has no nonhealing skin lesions.     Objective:   Physical Exam  Positive or pertinent findings include: As per CDC guidelines; morbid obesity is present. Excessive angulation forward of the mandibular teeth. Breath sounds are generally decreased without rhonchi, rales, or wheezing Abdomen is massive without organomegaly or tenderness. Posterior tibial pulses are slightly decreased. There is dramatic fusiform changes of the knees. She does employ a rolling walker.  General appearance :adequately nourished; in no distress. Eyes: No conjunctival inflammation or scleral icterus is present. Oral exam: Lips and gums are healthy appearing.There is no oropharyngeal erythema or exudate noted.  Heart:  Normal rate and regular rhythm. S1 and S2 normal without gallop, murmur, click, rub or other extra sounds   Abdomen: bowel sounds normal, soft and  non-tender without masses, organomegaly or hernias noted.  No guarding or rebound. No flank tenderness to percussion. Skin:Warm & dry.  Intact without suspicious lesions or rashes ; no jaundice or tenting Lymphatic: No lymphadenopathy is noted about the head, neck, axilla          Assessment & Plan:  #1 intermittent nausea  #2 frequency with leukocytes #3 intermittent dyspnea associated with #1  #4 hyperglycemia; home glucoses suggest control  See orders and recommendations

## 2013-09-05 NOTE — Progress Notes (Signed)
Patient here for B12 injection, under aseptic technique Cyanocobalamin given IM in Right deltoid at patient's request. Band-aid was applied and patient tolerated well.

## 2013-09-05 NOTE — Patient Instructions (Signed)
Patient will return in one month around (10/06/13) for next injection.

## 2013-09-05 NOTE — Progress Notes (Signed)
Pre visit review using our clinic review tool, if applicable. No additional management support is needed unless otherwise documented below in the visit note. 

## 2013-09-06 ENCOUNTER — Telehealth: Payer: Self-pay | Admitting: *Deleted

## 2013-09-06 LAB — URINE CULTURE
Colony Count: NO GROWTH
Organism ID, Bacteria: NO GROWTH

## 2013-09-06 NOTE — Telephone Encounter (Signed)
Called pt to set up appt for B12 injection with the nurses. I advised the pt that if she has any other problems, questions or concerns to call the office. Pt verbalized understanding.

## 2013-09-11 ENCOUNTER — Telehealth: Payer: Self-pay | Admitting: Internal Medicine

## 2013-09-11 NOTE — Telephone Encounter (Signed)
Pt request phone call about lab result, pt stated she did not get anything in the mail yet but she still want to speak to the assistant. Please call pt

## 2013-09-11 NOTE — Telephone Encounter (Signed)
I gave patient results of her lab and xray from 09/05/13. I let her know these results were mailed on 09/06/13 so she should be receiving them soon.

## 2013-10-03 ENCOUNTER — Ambulatory Visit (INDEPENDENT_AMBULATORY_CARE_PROVIDER_SITE_OTHER): Payer: Medicare PPO | Admitting: *Deleted

## 2013-10-03 DIAGNOSIS — E538 Deficiency of other specified B group vitamins: Secondary | ICD-10-CM

## 2013-10-03 NOTE — Patient Instructions (Signed)
Patient to return in one month for next injection.

## 2013-10-03 NOTE — Progress Notes (Signed)
Patient here for B12 injection, under aseptic technique Cyanocobalamin given IM in Left deltoid at patient's request. Band-aid was applied and patient tolerated well.

## 2013-10-12 ENCOUNTER — Ambulatory Visit: Payer: Medicare PPO | Admitting: Pulmonary Disease

## 2013-10-25 ENCOUNTER — Ambulatory Visit (INDEPENDENT_AMBULATORY_CARE_PROVIDER_SITE_OTHER): Payer: Medicare PPO

## 2013-10-25 DIAGNOSIS — Z23 Encounter for immunization: Secondary | ICD-10-CM

## 2013-11-02 ENCOUNTER — Ambulatory Visit (INDEPENDENT_AMBULATORY_CARE_PROVIDER_SITE_OTHER): Payer: Medicare PPO | Admitting: *Deleted

## 2013-11-02 DIAGNOSIS — E538 Deficiency of other specified B group vitamins: Secondary | ICD-10-CM

## 2013-11-02 NOTE — Progress Notes (Deleted)
Subjective:    Patient ID: April [name] is a 63 y.o. female.  HPI {Common ambulatory SmartLinks:[id number]}  Review of Systems  Objective:  Neurologic Exam  Physical Exam  Assessment:   ***  Plan:   ***

## 2013-11-02 NOTE — Progress Notes (Signed)
Pt was given a B-12 injection in the Rt Deltoid, aseptic  technique used, pt tolerated well, bandage applied.

## 2013-11-17 ENCOUNTER — Other Ambulatory Visit: Payer: Self-pay

## 2013-12-05 ENCOUNTER — Ambulatory Visit (INDEPENDENT_AMBULATORY_CARE_PROVIDER_SITE_OTHER): Payer: Medicare PPO | Admitting: *Deleted

## 2013-12-05 DIAGNOSIS — E538 Deficiency of other specified B group vitamins: Secondary | ICD-10-CM

## 2013-12-05 MED ORDER — CYANOCOBALAMIN 1000 MCG/ML IJ SOLN
1000.0000 ug | INTRAMUSCULAR | Status: AC
  Administered 2013-12-05 – 2014-05-04 (×6): 1000 ug via INTRAMUSCULAR

## 2013-12-05 NOTE — Progress Notes (Signed)
Pt here for B 12 injection.  Under aseptic technique cyanocobalamin 1000mcg/1ml IM given L deltoid.  Tolerated well.  Bandaid applied.  

## 2013-12-05 NOTE — Patient Instructions (Signed)
See next month. 

## 2013-12-18 ENCOUNTER — Encounter: Payer: Medicare PPO | Admitting: Internal Medicine

## 2013-12-19 ENCOUNTER — Encounter: Payer: Medicare PPO | Admitting: Internal Medicine

## 2013-12-19 ENCOUNTER — Ambulatory Visit (INDEPENDENT_AMBULATORY_CARE_PROVIDER_SITE_OTHER): Payer: Medicare PPO | Admitting: Internal Medicine

## 2013-12-19 ENCOUNTER — Other Ambulatory Visit (INDEPENDENT_AMBULATORY_CARE_PROVIDER_SITE_OTHER): Payer: Medicare PPO

## 2013-12-19 ENCOUNTER — Encounter: Payer: Self-pay | Admitting: Internal Medicine

## 2013-12-19 ENCOUNTER — Other Ambulatory Visit: Payer: Self-pay | Admitting: Internal Medicine

## 2013-12-19 VITALS — BP 138/92 | HR 73 | Temp 98.4°F | Resp 14 | Ht 64.0 in | Wt >= 6400 oz

## 2013-12-19 DIAGNOSIS — R05 Cough: Secondary | ICD-10-CM

## 2013-12-19 DIAGNOSIS — K219 Gastro-esophageal reflux disease without esophagitis: Secondary | ICD-10-CM

## 2013-12-19 DIAGNOSIS — M791 Myalgia: Secondary | ICD-10-CM

## 2013-12-19 DIAGNOSIS — R0609 Other forms of dyspnea: Secondary | ICD-10-CM

## 2013-12-19 DIAGNOSIS — M609 Myositis, unspecified: Secondary | ICD-10-CM

## 2013-12-19 DIAGNOSIS — IMO0001 Reserved for inherently not codable concepts without codable children: Secondary | ICD-10-CM

## 2013-12-19 DIAGNOSIS — E785 Hyperlipidemia, unspecified: Secondary | ICD-10-CM

## 2013-12-19 DIAGNOSIS — R059 Cough, unspecified: Secondary | ICD-10-CM

## 2013-12-19 DIAGNOSIS — I10 Essential (primary) hypertension: Secondary | ICD-10-CM

## 2013-12-19 LAB — HEPATIC FUNCTION PANEL
ALK PHOS: 67 U/L (ref 39–117)
ALT: 14 U/L (ref 0–35)
AST: 15 U/L (ref 0–37)
Albumin: 3.9 g/dL (ref 3.5–5.2)
BILIRUBIN DIRECT: 0.1 mg/dL (ref 0.0–0.3)
Total Bilirubin: 0.6 mg/dL (ref 0.2–1.2)
Total Protein: 7.2 g/dL (ref 6.0–8.3)

## 2013-12-19 LAB — CALCIUM: CALCIUM: 9 mg/dL (ref 8.4–10.5)

## 2013-12-19 LAB — TSH: TSH: 3.16 u[IU]/mL (ref 0.35–4.50)

## 2013-12-19 LAB — MAGNESIUM: Magnesium: 2.1 mg/dL (ref 1.5–2.5)

## 2013-12-19 LAB — POTASSIUM: Potassium: 3.8 mEq/L (ref 3.5–5.1)

## 2013-12-19 LAB — CK: Total CK: 59 U/L (ref 7–177)

## 2013-12-19 MED ORDER — RANITIDINE HCL 150 MG PO TABS: 150.0000 mg | ORAL_TABLET | Freq: Two times a day (BID) | ORAL | Status: DC

## 2013-12-19 NOTE — Patient Instructions (Signed)
Your next office appointment will be determined based upon review of your pending labs. Those instructions will be transmitted to you by mail.  Reflux of gastric acid may be asymptomatic as this may occur mainly during sleep.The triggers for reflux  include stress; the "aspirin family" ; alcohol; peppermint; and caffeine (coffee, tea, cola, and chocolate). The aspirin family would include aspirin and the nonsteroidal agents such as ibuprofen &  Naproxen. Tylenol would not cause reflux. If having symptoms ; food & drink should be avoided for @ least 2 hours before going to bed.   Minimal Blood Pressure Goal= AVERAGE < 140/90;  Ideal is an AVERAGE < 135/85. This AVERAGE should be calculated from @ least 5-7 BP readings taken @ different times of day on different days of week. You should not respond to isolated BP readings , but rather the AVERAGE for that week .Please bring your  blood pressure cuff to office visits to verify that it is reliable.It  can also be checked against the blood pressure device at the pharmacy. Finger or wrist cuffs are not dependable; an arm cuff is.

## 2013-12-19 NOTE — Progress Notes (Signed)
Pre visit review using our clinic review tool, if applicable. No additional management support is needed unless otherwise documented below in the visit note. 

## 2013-12-19 NOTE — Progress Notes (Signed)
Subjective:    Patient ID: April [name], female    DOB: 1950-05-14, 63 y.o.   MRN: [id number]  HPI She is here to assess active health issues & conditions. PMH, FH, & Social history verified & updated .  For 2 months she's been having cramps mainly in the legs during the day and night. The symptoms can awaken her. Her chemistries were normal except for mild hyperglycemia in August of this year.  She also describes a dry cough; she's not on ACE inhibitor.  She continues to have some peripheral edema. She also has exertional dyspnea  She is on sodium restricted diet. She's not engaged in regular exercise program.  Blood pressure averages 140/75 at home.  She has been compliant with her statin. She denies any GI issues or memory loss.   Review of Systems    Chest pain, palpitations, tachycardia,  paroxysmal nocturnal dyspnea,or  claudication are absent.  Her asthma is well controlled with maintenance medication of Symbicort 1 puff daily. She rarely uses her albuterol rescue inhaler, less than one time per week.  Unexplained weight loss, abdominal pain, significant dyspepsia, dysphagia, melena, rectal bleeding, or persistently small caliber stools are denied.       Objective:   Physical Exam Gen.: Adequately nourished in appearance. Alert, appropriate and cooperative throughout exam. As per CDC Guidelines ,Epic documents morbid obesity as being present . Head: Normocephalic without obvious abnormalities  Eyes: No corneal or conjunctival inflammation noted. Pupils equal round reactive to light and accommodation. Extraocular motion intact. Ears: External  ear exam reveals no significant lesions or deformities. Canals clear .TMs normal. Hearing is grossly normal bilaterally. Nose: External nasal exam reveals no deformity or inflammation. Nasal mucosa are pink and moist. No lesions or exudates noted.   Mouth: Oral mucosa and oropharynx reveal no lesions or exudates. Teeth in good  repair. Neck: No deformities, masses, or tenderness noted. Range of motion & Thyroid normal Lungs: Normal respiratory effort; chest expands symmetrically. Lungs are clear to auscultation without rales, wheezes, or increased work of breathing.Decreased BS Heart: Normal rate and rhythm. Normal S1 and S2. No gallop, click, or rub. Grade 1/2 over 6 systolic murmur Abdomen:Massive. Bowel sounds normal; abdomen soft and nontender. No masses, organomegaly or hernias noted. Genitalia: as per Gyn                                  Musculoskeletal/extremities: No deformity or scoliosis noted of  the thoracic or lumbar spine.  No clubbing, cyanosis noted.Lipedema @ knees & ankles. 1/2-1 + ankle edema.Tone & strength normal. Hand joints normal.  Fingernail health good. Vascular: Carotid, radial artery, dorsalis pedis and  posterior tibial pulses are full and equal. No bruits present. Neurologic: Alert and oriented x3.  Using rolling walker      Skin: Intact without suspicious lesions or rashes. Lymph: No cervical, axillary lymphadenopathy present. Psych: Mood and affect are normal. Normally interactive  Assessment & Plan:  See Current Assessment & Plan in Problem List under specific Diagnosis #2 myalgias #3 DOE , most likely from deconditioning but PMH of chronic diastolic heart failure & OSA. CXray if labs non diagnostic. Unfortunately CXray results will be affected by her body habitus.Cardiology re evaluation recommended if symptoms persist or progress #4 cough in absence of ACE-I; possibly from ERD

## 2013-12-19 NOTE — Assessment & Plan Note (Signed)
Lipids, LFTs, TSH  

## 2013-12-19 NOTE — Assessment & Plan Note (Addendum)
CBC & dif WNL 8/15

## 2013-12-21 LAB — NMR LIPOPROFILE WITH LIPIDS
CHOLESTEROL, TOTAL: 156 mg/dL (ref 100–199)
HDL Particle Number: 29.6 umol/L — ABNORMAL LOW (ref >=30.5)
HDL Size: 9.8 nm (ref >=9.2)
HDL-C: 49 mg/dL (ref >39)
LDL CALC: 96 mg/dL (ref 0–99)
LDL PARTICLE NUMBER: 1027 nmol/L — AB (ref <1000)
LDL Size: 21.4 nm (ref >=20.8)
LP-IR SCORE: 34 (ref <=45)
Large HDL-P: 6 umol/L (ref >=4.8)
Large VLDL-P: 1.6 nmol/L (ref <=2.7)
Small LDL Particle Number: 429 nmol/L (ref <=527)
Triglycerides: 56 mg/dL (ref 0–149)
VLDL SIZE: 53 nm — AB (ref <=46.6)

## 2014-01-02 ENCOUNTER — Ambulatory Visit (INDEPENDENT_AMBULATORY_CARE_PROVIDER_SITE_OTHER): Payer: Medicare PPO | Admitting: *Deleted

## 2014-01-02 DIAGNOSIS — E538 Deficiency of other specified B group vitamins: Secondary | ICD-10-CM

## 2014-01-02 NOTE — Progress Notes (Signed)
Pt here for B 12 injection.  Under aseptic technique cyanocobalamin 1000mcg/1ml IM given R deltoid.  Tolerated well.  Bandaid applied.  

## 2014-01-02 NOTE — Patient Instructions (Signed)
See next month, February 05, 2014 at 0830.

## 2014-01-03 ENCOUNTER — Telehealth: Payer: Self-pay | Admitting: Internal Medicine

## 2014-01-03 NOTE — Telephone Encounter (Signed)
Would like a phone call explaining recent labs.

## 2014-01-03 NOTE — Telephone Encounter (Signed)
Forwarding msg to Dr. Linna Darner assistant to call pt concerning labs...[name]

## 2014-01-03 NOTE — Telephone Encounter (Signed)
Per Dr Clayborn Heron note on patient's labs. I let her know all labs are within therapeutic range.

## 2014-01-04 ENCOUNTER — Telehealth: Payer: Self-pay | Admitting: Internal Medicine

## 2014-01-04 NOTE — Telephone Encounter (Signed)
Is requesting prevnar vac

## 2014-01-04 NOTE — Telephone Encounter (Signed)
Patient Information:  Caller Name: Kirrah  Phone: [phone]  Patient: Chiniqua, Blea  Gender: Female  DOB: [birth year]  Age: 63 Years  PCP: Unice Cobble  Office Follow Up:  Does the office need to follow up with this patient?: No  Instructions For The Office: N/A   Symptoms  Reason For Call & Symptoms: She fell over the Thanksgiving holiday 12/28/13 and she is having pain in the lower abdomen and back. She is also having pain in her left hip.  She is having trouble turning in the bed. She has not taken any pain meds .  She rates the pain as a 9/10 on pain scale.  Reviewed Health History In EMR: Yes  Reviewed Medications In EMR: Yes  Reviewed Allergies In EMR: Yes  Reviewed Surgeries / Procedures: Yes  Date of Onset of Symptoms: 12/28/2013  Guideline(s) Used:  Back Pain  Disposition Per Guideline:   Go to ED Now  Reason For Disposition Reached:   Abdominal pain and age > 65  Advice Given:  N/A  Patient Refused Recommendation:  Patient Will Go To ED  Pt may go to [facility] ED.

## 2014-01-04 NOTE — Telephone Encounter (Signed)
OK to schedule...April [name]

## 2014-01-09 ENCOUNTER — Encounter: Payer: Self-pay | Admitting: Internal Medicine

## 2014-01-09 ENCOUNTER — Ambulatory Visit (INDEPENDENT_AMBULATORY_CARE_PROVIDER_SITE_OTHER): Payer: Medicare PPO | Admitting: Internal Medicine

## 2014-01-09 ENCOUNTER — Other Ambulatory Visit (INDEPENDENT_AMBULATORY_CARE_PROVIDER_SITE_OTHER): Payer: Medicare PPO

## 2014-01-09 ENCOUNTER — Ambulatory Visit (HOSPITAL_COMMUNITY)
Admission: RE | Admit: 2014-01-09 | Discharge: 2014-01-09 | Disposition: A | Discharge status: Home / Self Care | Payer: Medicare PPO | Source: Ambulatory Visit | Attending: Internal Medicine | Admitting: Internal Medicine

## 2014-01-09 VITALS — BP 150/90 | HR 75 | Temp 98.2°F | Wt >= 6400 oz

## 2014-01-09 DIAGNOSIS — M545 Low back pain, unspecified: Secondary | ICD-10-CM

## 2014-01-09 DIAGNOSIS — W19XXXA Unspecified fall, initial encounter: Secondary | ICD-10-CM

## 2014-01-09 DIAGNOSIS — Y92009 Unspecified place in unspecified non-institutional (private) residence as the place of occurrence of the external cause: Secondary | ICD-10-CM

## 2014-01-09 DIAGNOSIS — T148XXA Other injury of unspecified body region, initial encounter: Secondary | ICD-10-CM

## 2014-01-09 DIAGNOSIS — T148 Other injury of unspecified body region: Secondary | ICD-10-CM

## 2014-01-09 LAB — CBC WITH DIFFERENTIAL/PLATELET
Basophils Absolute: 0 10*3/uL (ref 0.0–0.1)
Basophils Relative: 0.5 % (ref 0.0–3.0)
Eosinophils Absolute: 0.1 10*3/uL (ref 0.0–0.7)
Eosinophils Relative: 0.7 % (ref 0.0–5.0)
HCT: 40.9 % (ref 36.0–46.0)
Hemoglobin: 13.1 g/dL (ref 12.0–15.0)
Lymphocytes Relative: 17.8 % (ref 12.0–46.0)
Lymphs Abs: 1.3 10*3/uL (ref 0.7–4.0)
MCHC: 32 g/dL (ref 30.0–36.0)
MCV: 87.5 fl (ref 78.0–100.0)
Monocytes Absolute: 0.5 10*3/uL (ref 0.1–1.0)
Monocytes Relative: 7.1 % (ref 3.0–12.0)
Neutro Abs: 5.4 10*3/uL (ref 1.4–7.7)
Neutrophils Relative %: 73.9 % (ref 43.0–77.0)
Platelets: 179 10*3/uL (ref 150.0–400.0)
RBC: 4.67 Mil/uL (ref 3.87–5.11)
RDW: 15.2 % (ref 11.5–15.5)
WBC: 7.3 10*3/uL (ref 4.0–10.5)

## 2014-01-09 MED ORDER — TRAMADOL HCL 50 MG PO TABS: 50.0000 mg | ORAL_TABLET | Freq: Three times a day (TID) | ORAL | Status: DC | PRN

## 2014-01-09 NOTE — Patient Instructions (Signed)
Take the tramadol at bedtime as needed; do not take it within 8-12 hours of  the Duloxetine. Your next office appointment will be determined based upon review of your pending labs & x-rays. Those instructions will be transmitted to you through My Chart  OR  by mail;whichever process is your choice to receive results & recommendations . Followup as needed for your acute issue. Please report any significant change in your symptoms.

## 2014-01-09 NOTE — Progress Notes (Signed)
   Subjective:    Patient ID: April [name], female    DOB: 07-04-1950, 63 y.o.   MRN: [id number]  HPI   She lost her balance 12/28/13 while trying to dress in her bedroom. She fell on her buttock. She had difficulty standing up and fell again landing on her knees. She required family members to help her get back on her feet  Subsequently she noted some bruising over the upper arms anteriorly as well as the left knee. She's had residual pain in the knee. The major symptom is pain in the lumbosacral area with radiation bilaterally into the abdomen.  She has pain with waist flexion.  The pain has been essentially stable. She's not taking medicine for it.  She does have chronic weakness in her legs which is unrelated.  There was no cardiac or neurologic prodrome prior to the fall.  She has no other bleeding dyscrasias.   Review of Systems  Denied were any change in heart rhythm or rate prior to the event. There was no associated chest pain or shortness of breath .  Also specifically denied prior to the episode were headache, limb weakness, tingling, or numbness. No seizure activity noted. Epistaxis, hemoptysis, hematuria, melena, or rectal bleeding denied.  There is no abnormal  bleeding, or difficulty stopping bleeding with injury.     Objective:   Physical Exam   Pertinent or positive findings include: She ambulates with a rolling walker. Morbid obesity is present. Breath sounds are decreased without increased work of breathing Abdomen is massive; there is no tenderness. Knees are fusiformly enlarged. She has pain with range of motion. There is minor ecchymoses over the upper extremities bilaterally. She has minor ecchymoses over the left knee. She has significant lipedema as well as edema at the sock line  General appearance :adequately nourished; in no distress. Eyes: No conjunctival inflammation or scleral icterus is present. Heart:  Normal rate and regular rhythm. S1 and  S2 normal without gallop, murmur, click, rub or other extra sounds   Lungs:Chest clear to auscultation; no wheezes, rhonchi,rales ,or rubs present.No increased work of breathing.  Abdomen: bowel sounds normal, soft and non-tender without masses, organomegaly or hernias noted.  No guarding or rebound.  Skin:Warm & dry.  Intact without suspicious lesions or rashes ; no  tenting Lymphatic: No lymphadenopathy is noted about the head, neck, axilla             Assessment & Plan:  #1 mechanical fall with residual LS pain #2 bruising post fall See Orders

## 2014-01-09 NOTE — Progress Notes (Signed)
Pre visit review using our clinic review tool, if applicable. No additional management support is needed unless otherwise documented below in the visit note. 

## 2014-01-10 ENCOUNTER — Telehealth: Payer: Self-pay | Admitting: Internal Medicine

## 2014-01-10 ENCOUNTER — Other Ambulatory Visit: Payer: Self-pay | Admitting: Internal Medicine

## 2014-01-10 DIAGNOSIS — M545 Low back pain, unspecified: Secondary | ICD-10-CM

## 2014-01-10 DIAGNOSIS — Z9181 History of falling: Secondary | ICD-10-CM

## 2014-01-10 NOTE — Telephone Encounter (Signed)
Phone call placed to patient. Left message to return call.

## 2014-01-10 NOTE — Telephone Encounter (Signed)
ordered

## 2014-01-10 NOTE — Telephone Encounter (Signed)
I can refer her but insurance will not cover in home PT

## 2014-01-10 NOTE — Telephone Encounter (Signed)
Pt requesting results of xrays done yesterday, pt does not want to wait for the mail. Please call her with results.

## 2014-01-10 NOTE — Telephone Encounter (Signed)
When I was speaking to the patient on the phone she seems to believe her insurance will cover in home PT

## 2014-01-10 NOTE — Telephone Encounter (Signed)
Patient has been informed of xray and lab results. She would like a referral for PT if they can come to her house since she has transportation issues.

## 2014-01-19 ENCOUNTER — Other Ambulatory Visit: Payer: Self-pay | Admitting: Internal Medicine

## 2014-01-19 NOTE — Progress Notes (Signed)
Phone call to patient. She has been requesting home physical therapy. Per Dr Linna Darner I let the patient know it would be more beneficial if she could get the physical therapy at the facility where all the equipment is at rather than in her home. She states she requested home PT due to transportation issues. I let her know we can still place the order for home PT but it wont be as beneficial. At this times patient declined PT and will just call back if she changes her mind.

## 2014-02-05 ENCOUNTER — Other Ambulatory Visit: Payer: Self-pay | Admitting: Internal Medicine

## 2014-02-05 ENCOUNTER — Ambulatory Visit (INDEPENDENT_AMBULATORY_CARE_PROVIDER_SITE_OTHER): Payer: Medicare PPO | Admitting: *Deleted

## 2014-02-05 ENCOUNTER — Other Ambulatory Visit: Payer: Self-pay | Admitting: Obstetrics

## 2014-02-05 DIAGNOSIS — Z1231 Encounter for screening mammogram for malignant neoplasm of breast: Secondary | ICD-10-CM

## 2014-02-05 DIAGNOSIS — E538 Deficiency of other specified B group vitamins: Secondary | ICD-10-CM

## 2014-02-05 NOTE — Progress Notes (Signed)
Pt here for B 12 injection.  Under aseptic technique cyanocobalamin 1000mcg/1ml IM given L deltoid.  Tolerated well.  Bandaid applied.  

## 2014-02-05 NOTE — Patient Instructions (Signed)
See next month 03-05-14 at 0830

## 2014-03-05 ENCOUNTER — Ambulatory Visit (INDEPENDENT_AMBULATORY_CARE_PROVIDER_SITE_OTHER): Payer: Medicare PPO | Admitting: Neurology

## 2014-03-05 DIAGNOSIS — E538 Deficiency of other specified B group vitamins: Secondary | ICD-10-CM

## 2014-03-05 NOTE — Progress Notes (Signed)
Patient here for B12 injection.  Under aseptic technique Cyanocobalamin 1068mcg/1ml given IM in right deltoid.  Tolerated well.  Band-Aid applied.

## 2014-03-10 ENCOUNTER — Other Ambulatory Visit: Payer: Self-pay | Admitting: Internal Medicine

## 2014-03-12 ENCOUNTER — Ambulatory Visit (HOSPITAL_COMMUNITY)
Admission: RE | Admit: 2014-03-12 | Discharge: 2014-03-12 | Disposition: A | Discharge status: Home / Self Care | Payer: Medicare PPO | Source: Ambulatory Visit | Attending: Internal Medicine | Admitting: Internal Medicine

## 2014-03-12 ENCOUNTER — Other Ambulatory Visit: Payer: Self-pay | Admitting: Internal Medicine

## 2014-03-12 DIAGNOSIS — Z1231 Encounter for screening mammogram for malignant neoplasm of breast: Secondary | ICD-10-CM

## 2014-03-13 ENCOUNTER — Telehealth: Payer: Self-pay | Admitting: Internal Medicine

## 2014-03-13 DIAGNOSIS — I10 Essential (primary) hypertension: Secondary | ICD-10-CM

## 2014-03-13 DIAGNOSIS — J45901 Unspecified asthma with (acute) exacerbation: Secondary | ICD-10-CM

## 2014-03-13 MED ORDER — VERAPAMIL HCL ER 360 MG PO CP24: 360.0000 mg | ORAL_CAPSULE | Freq: Every day | ORAL | Status: DC

## 2014-03-13 MED ORDER — BUDESONIDE-FORMOTEROL FUMARATE 160-4.5 MCG/ACT IN AERO: INHALATION_SPRAY | RESPIRATORY_TRACT | Status: DC

## 2014-03-13 MED ORDER — ALBUTEROL SULFATE HFA 108 (90 BASE) MCG/ACT IN AERS: 2.0000 | INHALATION_SPRAY | RESPIRATORY_TRACT | Status: DC | PRN

## 2014-03-13 NOTE — Telephone Encounter (Signed)
Pt called in looking for refills on verapamil (VERELAN PM) 360 MG 24 hr capsule [id number]  And her inhauler .  She wanted to see if these can be sent in.

## 2014-03-14 ENCOUNTER — Encounter: Payer: Self-pay | Admitting: Internal Medicine

## 2014-03-14 NOTE — Progress Notes (Addendum)
Prior authorization form for ProAir has been faxed back to [employer].

## 2014-03-15 NOTE — Progress Notes (Signed)
Insurance denied coverage for ProAir HFA 90 MCG inhaler

## 2014-03-20 ENCOUNTER — Other Ambulatory Visit: Payer: Self-pay

## 2014-03-20 NOTE — Telephone Encounter (Signed)
Opened in error

## 2014-04-03 ENCOUNTER — Ambulatory Visit (INDEPENDENT_AMBULATORY_CARE_PROVIDER_SITE_OTHER): Payer: Medicare PPO | Admitting: *Deleted

## 2014-04-03 DIAGNOSIS — E538 Deficiency of other specified B group vitamins: Secondary | ICD-10-CM | POA: Diagnosis not present

## 2014-04-03 NOTE — Progress Notes (Signed)
B12 administered Im in right deltoid, tolerated well, bandaid applied.

## 2014-04-11 ENCOUNTER — Telehealth (HOSPITAL_COMMUNITY): Payer: Self-pay | Admitting: Vascular Surgery

## 2014-04-11 ENCOUNTER — Telehealth: Payer: Self-pay | Admitting: Internal Medicine

## 2014-04-11 NOTE — Telephone Encounter (Signed)
Patient is requesting hydrALAZINE (APRESOLINE) 50 MG tablet [id number] be filled to [facility] on [address]

## 2014-04-11 NOTE — Telephone Encounter (Signed)
Pt called she need a refill of Hydrazaline , her pharmacy told her to call because they having been sending to request and no reply

## 2014-04-12 MED ORDER — HYDRALAZINE HCL 50 MG PO TABS: ORAL_TABLET | ORAL | Status: DC

## 2014-04-16 ENCOUNTER — Other Ambulatory Visit: Payer: Self-pay

## 2014-04-16 DIAGNOSIS — E785 Hyperlipidemia, unspecified: Secondary | ICD-10-CM

## 2014-04-16 MED ORDER — SIMVASTATIN 40 MG PO TABS: 40.0000 mg | ORAL_TABLET | Freq: Every day | ORAL | Status: DC

## 2014-04-26 DIAGNOSIS — H2513 Age-related nuclear cataract, bilateral: Secondary | ICD-10-CM | POA: Diagnosis not present

## 2014-04-26 DIAGNOSIS — E119 Type 2 diabetes mellitus without complications: Secondary | ICD-10-CM | POA: Diagnosis not present

## 2014-05-04 ENCOUNTER — Ambulatory Visit (INDEPENDENT_AMBULATORY_CARE_PROVIDER_SITE_OTHER): Payer: Medicare PPO | Admitting: *Deleted

## 2014-05-04 DIAGNOSIS — E538 Deficiency of other specified B group vitamins: Secondary | ICD-10-CM

## 2014-05-05 ENCOUNTER — Other Ambulatory Visit: Payer: Self-pay | Admitting: Neurology

## 2014-05-31 ENCOUNTER — Other Ambulatory Visit: Payer: Self-pay | Admitting: Neurology

## 2014-06-01 NOTE — Telephone Encounter (Signed)
Patient has appt scheduled

## 2014-06-04 ENCOUNTER — Encounter: Payer: Self-pay | Admitting: Internal Medicine

## 2014-06-04 ENCOUNTER — Ambulatory Visit: Payer: Medicare PPO

## 2014-06-04 ENCOUNTER — Ambulatory Visit (INDEPENDENT_AMBULATORY_CARE_PROVIDER_SITE_OTHER): Payer: Medicare PPO | Admitting: *Deleted

## 2014-06-04 DIAGNOSIS — E538 Deficiency of other specified B group vitamins: Secondary | ICD-10-CM | POA: Diagnosis not present

## 2014-06-04 MED ORDER — CYANOCOBALAMIN 1000 MCG/ML IJ SOLN
1000.0000 ug | INTRAMUSCULAR | Status: AC
  Administered 2014-06-04 – 2015-04-03 (×10): 1000 ug via INTRAMUSCULAR

## 2014-06-11 ENCOUNTER — Telehealth: Payer: Self-pay | Admitting: Internal Medicine

## 2014-06-11 NOTE — Telephone Encounter (Signed)
Pt called and said that her acid reflex meds are not helping.  It there something else she can take?  She wasn't wanting to come in.  Told her she may need a OV.     Best number [phone]

## 2014-06-11 NOTE — Telephone Encounter (Signed)
Would need OV with all meds

## 2014-06-12 ENCOUNTER — Ambulatory Visit: Payer: Medicare PPO | Admitting: Neurology

## 2014-06-13 ENCOUNTER — Ambulatory Visit (INDEPENDENT_AMBULATORY_CARE_PROVIDER_SITE_OTHER): Payer: Medicare PPO | Admitting: Internal Medicine

## 2014-06-13 ENCOUNTER — Encounter: Payer: Self-pay | Admitting: Internal Medicine

## 2014-06-13 ENCOUNTER — Other Ambulatory Visit (INDEPENDENT_AMBULATORY_CARE_PROVIDER_SITE_OTHER): Payer: Medicare PPO

## 2014-06-13 VITALS — BP 168/92 | HR 73 | Temp 98.2°F | Resp 15 | Ht 64.0 in | Wt >= 6400 oz

## 2014-06-13 DIAGNOSIS — I10 Essential (primary) hypertension: Secondary | ICD-10-CM

## 2014-06-13 DIAGNOSIS — K2100 Gastro-esophageal reflux disease with esophagitis, without bleeding: Secondary | ICD-10-CM

## 2014-06-13 LAB — CBC WITH DIFFERENTIAL/PLATELET
BASOS PCT: 0.6 % (ref 0.0–3.0)
Basophils Absolute: 0 10*3/uL (ref 0.0–0.1)
Eosinophils Absolute: 0 10*3/uL (ref 0.0–0.7)
Eosinophils Relative: 0.4 % (ref 0.0–5.0)
HCT: 38.5 % (ref 36.0–46.0)
Hemoglobin: 12.4 g/dL (ref 12.0–15.0)
Lymphocytes Relative: 14.9 % (ref 12.0–46.0)
Lymphs Abs: 1.2 10*3/uL (ref 0.7–4.0)
MCHC: 32.2 g/dL (ref 30.0–36.0)
MCV: 87.2 fl (ref 78.0–100.0)
MONOS PCT: 6 % (ref 3.0–12.0)
Monocytes Absolute: 0.5 10*3/uL (ref 0.1–1.0)
NEUTROS ABS: 6.1 10*3/uL (ref 1.4–7.7)
Neutrophils Relative %: 78.1 % — ABNORMAL HIGH (ref 43.0–77.0)
PLATELETS: 203 10*3/uL (ref 150.0–400.0)
RBC: 4.42 Mil/uL (ref 3.87–5.11)
RDW: 16.1 % — ABNORMAL HIGH (ref 11.5–15.5)
WBC: 7.8 10*3/uL (ref 4.0–10.5)

## 2014-06-13 LAB — BASIC METABOLIC PANEL
BUN: 27 mg/dL — ABNORMAL HIGH (ref 6–23)
CO2: 27 mEq/L (ref 19–32)
Calcium: 9.1 mg/dL (ref 8.4–10.5)
Chloride: 109 mEq/L (ref 96–112)
Creatinine, Ser: 1.17 mg/dL (ref 0.40–1.20)
GFR: 59.89 mL/min — AB (ref >60.00)
GLUCOSE: 119 mg/dL — AB (ref 70–99)
Potassium: 3.6 mEq/L (ref 3.5–5.1)
Sodium: 143 mEq/L (ref 135–145)

## 2014-06-13 MED ORDER — HYDRALAZINE HCL 50 MG PO TABS: ORAL_TABLET | ORAL | Status: DC

## 2014-06-13 MED ORDER — OMEPRAZOLE 20 MG PO CPDR: 20.0000 mg | DELAYED_RELEASE_CAPSULE | Freq: Every day | ORAL | Status: DC

## 2014-06-13 NOTE — Patient Instructions (Signed)
Reflux of gastric acid may be asymptomatic as this may occur mainly during sleep.The triggers for reflux  include stress; the "aspirin family" ; alcohol; peppermint; and caffeine (coffee, tea, cola, and chocolate). The aspirin family would include aspirin and the nonsteroidal agents such as ibuprofen &  Naproxen. Tylenol would not cause reflux. If having symptoms ; food & drink should be avoided for @ least 2 hours before going to bed. Take the protein pump inhibitor,Omeprazole , 30 minutes before breakfast and 30 minutes before the evening meal for 8 weeks then go back to once a day  30 minutes before breakfast.The GI referral will be scheduled and you'll be notified of the time.Please call the Referral Co-Ordinator @ [phone] if you have not been notified of appointment time within 7-10 days.   Minimal Blood Pressure Goal= AVERAGE < 140/90;  Ideal is an AVERAGE < 135/85. This AVERAGE should be calculated from @ least 5-7 BP readings taken @ different times of day on different days of week. You should not respond to isolated BP readings , but rather the AVERAGE for that week .Please bring your  blood pressure cuff to office visits to verify that it is reliable.It  can also be checked against the blood pressure device at the pharmacy.    Your next office appointment will be determined based upon review of your pending labs. hose instructions will be transmitted to you by  mail for your records. Critical results will be called. Followup as needed for any active or acute issue. Please report any significant change in your symptoms.

## 2014-06-13 NOTE — Progress Notes (Signed)
Pre visit review using our clinic review tool, if applicable. No additional management support is needed unless otherwise documented below in the visit note. 

## 2014-06-13 NOTE — Progress Notes (Signed)
   Subjective:    Patient ID: April [name], female    DOB: March 23, 1950, 64 y.o.   MRN: [id number]  HPI  For 2 weeks she describes nausea and intermittent dysphagia. She states it feels as if "bubbles" are coming up into the throat. This is despite taking ranitidine twice a day. She denies specific triggers although she has been drinking at least one caffeinated beverage daily.   Other than constipation she has no other active GI symptoms.   She does have some intermittent dizziness. She's not monitoring blood pressure at home. She denies active cardiovascular symptoms.    Review of Systems Unexplained weight loss, abdominal pain, significant dyspepsia,  melena, rectal bleeding, or persistently small caliber stools are denied.  Chest pain, palpitations, tachycardia, exertional dyspnea, paroxysmal nocturnal dyspnea, claudication or edema are absent.      Objective:   Physical Exam  Pertinent or positive findings include: BMI is 72.06. She has marked erythema of the nasal mucosa. She has 1+ edema to the midshin. Pedal pulses are decreased  General appearance :adequately nourished; in no distress. Eyes: No conjunctival inflammation or scleral icterus is present. Oral exam:  Lips and gums are healthy appearing.There is no oropharyngeal erythema or exudate noted. Dental hygiene is good. Heart:  Normal rate and regular rhythm. S1 and S2 normal without gallop, murmur, click, rub or other extra sounds   Lungs:Chest clear to auscultation; no wheezes, rhonchi,rales ,or rubs present.No increased work of breathing.  Abdomen: Massive abdomen;bowel sounds normal, soft and non-tender without masses, organomegaly or hernias noted.  No guarding or rebound.  Vascular : all pulses equal ; no bruits present. Skin:Warm & dry.  Intact without suspicious lesions or rashes ; no tenting  Lymphatic: No lymphadenopathy is noted about the head, neck, axilla Neuro: Strength, tone decreased. She ambulates  using a rolling walker. She has great difficulty with mobilization due to body habitus        Assessment & Plan:  #1 symptomatic reflux with esophagitis  #2 uncontrolled hypertension  Plan: See orders & recommendations

## 2014-06-15 ENCOUNTER — Other Ambulatory Visit: Payer: Self-pay

## 2014-06-15 LAB — H. PYLORI ANTIBODY, IGG: H PYLORI IGG: NEGATIVE

## 2014-06-15 MED ORDER — CARVEDILOL 25 MG PO TABS: ORAL_TABLET | ORAL | Status: DC

## 2014-06-17 ENCOUNTER — Other Ambulatory Visit: Payer: Self-pay | Admitting: Internal Medicine

## 2014-06-17 DIAGNOSIS — K2100 Gastro-esophageal reflux disease with esophagitis, without bleeding: Secondary | ICD-10-CM

## 2014-06-17 DIAGNOSIS — K219 Gastro-esophageal reflux disease without esophagitis: Secondary | ICD-10-CM | POA: Insufficient documentation

## 2014-06-18 ENCOUNTER — Other Ambulatory Visit: Payer: Self-pay

## 2014-06-18 ENCOUNTER — Telehealth: Payer: Self-pay

## 2014-06-18 DIAGNOSIS — R739 Hyperglycemia, unspecified: Secondary | ICD-10-CM

## 2014-06-18 NOTE — Telephone Encounter (Signed)
-----   Message from Hendricks Limes, MD sent at 06/17/2014  9:06 AM EDT ----- Please add A1c (R73.9)

## 2014-06-18 NOTE — Telephone Encounter (Signed)
Advised patient she needs to come back  Lab dept and have a1c drawn, will need to fast at midnite the nite before

## 2014-06-20 ENCOUNTER — Encounter: Payer: Self-pay | Admitting: Internal Medicine

## 2014-06-29 ENCOUNTER — Other Ambulatory Visit (INDEPENDENT_AMBULATORY_CARE_PROVIDER_SITE_OTHER): Payer: Medicare PPO

## 2014-06-29 DIAGNOSIS — R739 Hyperglycemia, unspecified: Secondary | ICD-10-CM | POA: Diagnosis not present

## 2014-06-29 LAB — HEMOGLOBIN A1C: Hgb A1c MFr Bld: 5.8 % (ref 4.6–6.5)

## 2014-07-04 ENCOUNTER — Encounter: Payer: Self-pay | Admitting: Neurology

## 2014-07-04 ENCOUNTER — Ambulatory Visit (INDEPENDENT_AMBULATORY_CARE_PROVIDER_SITE_OTHER): Payer: Self-pay | Admitting: *Deleted

## 2014-07-04 ENCOUNTER — Ambulatory Visit (INDEPENDENT_AMBULATORY_CARE_PROVIDER_SITE_OTHER): Payer: Medicare PPO | Admitting: Neurology

## 2014-07-04 VITALS — BP 149/76 | HR 76 | Ht 64.0 in | Wt >= 6400 oz

## 2014-07-04 DIAGNOSIS — E538 Deficiency of other specified B group vitamins: Secondary | ICD-10-CM

## 2014-07-04 DIAGNOSIS — Z0289 Encounter for other administrative examinations: Secondary | ICD-10-CM

## 2014-07-04 MED ORDER — GABAPENTIN 300 MG PO CAPS: 300.0000 mg | ORAL_CAPSULE | Freq: Three times a day (TID) | ORAL | Status: DC

## 2014-07-04 MED ORDER — TOPIRAMATE 100 MG PO TABS: 100.0000 mg | ORAL_TABLET | Freq: Two times a day (BID) | ORAL | Status: DC

## 2014-07-04 NOTE — Progress Notes (Signed)
Chief Complaint  Patient presents with  . Vitamin B12 Deficiency    Feels her energy level has decreased.  She is having more problems with her gait due to swelling in her feet.     [facility]  PATIENT: April [name] DOB: [birth year]   REASON FOR VISIT: Followup for numbness and muscle cramping   HISTORY OF PRESENT ILLNESS:  HISTORY ( Initial evaluation 10/26/2062): evaluation of bilateral muscle cramping.  She had past medical history of obesity, diabetes, hyperlipidemia, hypertension, presenting with bilateral feet paresthesia muscle cramping.   saw her previously for diabetic peripheral neuropathy, she complained of bilateral knee pain, bilateral feet paresthesia, difficulty bearing weight, gait difficulty, EMG nerve conduction study in January 2013 confirmed the diagnosis of axonal peripheral neuropathy,   She has been taking Topamax 100 mg twice a day for bilateral feet paresthesia, previous laboratory evaluation demonstrated normal sodium, potassium, magnesium, CPK of 92.  She continued to suffer severe morbid obesity, continued to have low back pain, gait difficulty, bilateral knee pain, recent in one year, she also began to suffer bilateral feet muscle cramping, intermittent, severe, lasting 7-8 minutes, happen about once a week. It often involves her feet muscles, also involves her thigh muscles.  She continued to have significant obesity, gait difficulty.  Lab showed low B12 183 October 2014, received B12 shot, now monthly, repeat B12 level January 2015 was normal 622  She came in with wheelchair, last visit was with Hoyle Sauer in January 2064, she recently developed acid reflux, now on prilosec, mild difficulty swallowing. She still has feet swelling, bilateral feet numbness, burning sensation, frequent bilateral feet, and leg muscle crampiness, couple times a week, woke her up from sleep, very painful, she is taking Topamax 100 mg twice a day, which seems to help  her some,  She has become more sedentary cause of her weight, knee pain, sitting in wheelchair most of the time  REVIEW OF SYSTEMS: Full 14 system review of systems performed and notable only for those listed, all others are neg:  Anemia, dizziness, headaches, numbness, weakness, muscle cramps, walking difficulty, chill, fatigue, trouble swallowing, cough, wheezing, shortness of breath, leg swelling, constipation, nauseous, snoring   ALLERGIES: Allergies  Allergen Reactions  . Penicillins     REACTION: rash  . Benazepril Hcl   . Hydrochlorothiazide W-Triamterene     REACTION: hypokalemia  . Metronidazole   . Spironolactone     REACTION: kidney problems  . Sulfonamide Derivatives     REACTION: internal "burning"  . Torsemide   . Valsartan     HOME MEDICATIONS: Outpatient Prescriptions Prior to Visit  Medication Sig Dispense Refill  . albuterol (PROVENTIL HFA) 108 (90 BASE) MCG/ACT inhaler Inhale 2 puffs into the lungs every 4 (four) hours as needed for wheezing or shortness of breath. 1 Inhaler 4  . amLODipine (NORVASC) 5 MG tablet Take 1 tablet (5 mg total) by mouth daily. If BP not < 140/90 with increased BP meds 90 tablet 3  . aspirin (ECOTRIN LOW STRENGTH) 81 MG EC tablet Take 81 mg by mouth daily.      . budesonide-formoterol (SYMBICORT) 160-4.5 MCG/ACT inhaler one - two inhalations every 12 hours; gargle and spit after use 1 Inhaler 4  . carvedilol (COREG) 25 MG tablet TAKE 1 TABLET BY MOUTH EVERY MORNING AND ONE-HALF TAB EVERY EVENING 135 tablet 2  . Cod Liver Oil CAPS Take 2 capsules by mouth daily.    . DULoxetine (CYMBALTA) 60 MG capsule Take 1  capsule (60 mg total) by mouth daily. 30 capsule 12  . Flaxseed, Linseed, (FLAX SEED OIL) 1000 MG CAPS Take by mouth 3 (three) times daily.      . folic acid (FOLVITE) Q000111Q MCG tablet Take 800 mcg by mouth daily.    . furosemide (LASIX) 40 MG tablet take 1 tablet by mouth once daily if needed 90 tablet 1  . glucose blood (ONE  TOUCH ULTRA TEST) test strip Use as instructed 100 each 3  . hydrALAZINE (APRESOLINE) 50 MG tablet Take 1 tab by mouth every 8 hrs 270 tablet 1  . Lancets (ONETOUCH ULTRASOFT) lancets Use as instructed 100 each 3  . Multiple Vitamin (MULTIVITAMIN) tablet Take 1 tablet by mouth daily.      . Omega-3 Fatty Acids (FISH OIL) 1000 MG CAPS Take by mouth daily.      Marland Kitchen omeprazole (PRILOSEC) 20 MG capsule Take 1 capsule (20 mg total) by mouth daily. 60 capsule 1  . simvastatin (ZOCOR) 40 MG tablet Take 1 tablet (40 mg total) by mouth daily. 90 tablet 2  . spironolactone (ALDACTONE) 25 MG tablet take 1 tablet by mouth twice a day 180 tablet 1  . topiramate (TOPAMAX) 100 MG tablet take 1 tablet by mouth twice a day 60 tablet 1  . traMADol (ULTRAM) 50 MG tablet Take 1 tablet (50 mg total) by mouth every 8 (eight) hours as needed. 30 tablet 0  . verapamil (VERELAN PM) 360 MG 24 hr capsule Take 1 capsule (360 mg total) by mouth daily. 90 capsule 1   Facility-Administered Medications Prior to Visit  Medication Dose Route Frequency Provider Last Rate Last Dose  . cyanocobalamin ((VITAMIN B-12)) injection 1,000 mcg  1,000 mcg Intramuscular Q30 days Marcial Pacas, MD   1,000 mcg at 06/04/14 0908    PAST MEDICAL HISTORY: Past Medical History  Diagnosis Date  . Meralgia paresthetica     Dr. [name]  . CTS (carpal tunnel syndrome)   . CAD (coronary artery disease)   . Osteoarthrosis, unspecified whether generalized or localized, lower leg   . Diabetes mellitus   . Hypertension   . Hyperlipidemia   . Morbid obesity   . PUD (peptic ulcer disease)   . Unspecified hereditary and idiopathic peripheral neuropathy   . Morbid obesity   . Type II or unspecified type diabetes mellitus without mention of complication, not stated as uncontrolled   . Carpal tunnel syndrome   . Asthma   . Colon polyp, hyperplastic 2007 & 2012    PAST SURGICAL HISTORY: Past Surgical History  Procedure Laterality Date  . Abdominal  hysterectomy    . Dilation and curettage of uterus      multiple  . Tonsillectomy and adenoidectomy    . Cardiac catheterization  2002    non obstructive disease  . Renal calculi  12/1997    SVT with induction of anesthesia  . Colonoscopy with polypectomy  2007 & 2012     hyperplastic ;Dr Watt Climes    FAMILY HISTORY: Family History  Problem Relation Age of Onset  . Colon cancer Mother   . Prostate cancer Father   . Colon cancer Father   . Diabetes Maternal Aunt   . Diabetes Maternal Uncle   . Diabetes Paternal Aunt   . Stroke Paternal Aunt     > 65  . Heart disease Paternal Aunt   . Diabetes Paternal Uncle   . Breast cancer Maternal Aunt      X 2  SOCIAL HISTORY: History   Social History  . Marital Status: Married    Spouse Name: [name]  . Number of Children: 1  . Years of Education: BS   Occupational History  . Disabled    Social History Main Topics  . Smoking status: Never Smoker   . Smokeless tobacco: Never Used  . Alcohol Use: No  . Drug Use: No  . Sexual Activity: Not on file   Other Topics Concern  . Not on file   Social History Narrative   Patient lives at home with her husband [name] Patient is retired and has a Conservation officer, nature.    Caffeine - some times.   Right handed.     PHYSICAL EXAM  Filed Vitals:   07/04/14 0828  BP: 149/76  Pulse: 76  Height: 5\' 4"  (1.626 m)  Weight: 428 lb (194.14 kg)   Body mass index is 73.43 kg/(m^2).  PHYSICAL EXAMNIATION:  Gen: NAD, conversant, well nourised, obese, well groomed                     Cardiovascular: Regular rate rhythm, no peripheral edema, warm, nontender. Eyes: Conjunctivae clear without exudates or hemorrhage Neck: Supple, no carotid bruise. Pulmonary: Clear to auscultation bilaterally  Skin: Bilateral lower extremity skin discoloration, thickness,  NEUROLOGICAL EXAM:  MENTAL STATUS: Speech:    Speech is normal; fluent and spontaneous with normal comprehension.    Cognition:    The patient is oriented to person, place, and time;     recent and remote memory intact;     language fluent;     normal attention, concentration,     fund of knowledge.  CRANIAL NERVES: CN II: Visual fields are full to confrontation. Fundoscopic exam is normal with sharp discs and no vascular changes.  Pupils are 4 mm and briskly reactive to light.  CN III, IV, VI: extraocular movement are normal. No ptosis. CN V: Facial sensation is intact to pinprick in all 3 divisions bilaterally. Corneal responses are intact.  CN VII: Face is symmetric with normal eye closure and smile. CN VIII: Hearing is normal to rubbing fingers CN IX, X: Palate elevates symmetrically. Phonation is normal. CN XI: Head turning and shoulder shrug are intact CN XII: Tongue is midline with normal movements and no atrophy.  MOTOR: There is no pronator drift of out-stretched arms. Muscle bulk and tone are normal. Muscle strength is normal.  REFLEXES: Reflexes are 2+ and symmetric at the biceps, triceps, knees, and ankles. Plantar responses are flexor.  SENSORY: Length dependent decreased touch, pinprick to midshin level, decreased vibration sense at bilateral knees  COORDINATION: Rapid alternating movements and fine finger movements are intact. There is no dysmetria on finger-to-nose and heel-knee-shin. There are no abnormal or extraneous movements.   GAIT/STANCE: Deferred because of big body habitus, multiple joints pain,    DIAGNOSTIC DATA (LABS, IMAGING, TESTING) - I reviewed patient records, labs, notes, testing and imaging myself where available.  Lab Results  Component Value Date   WBC 7.8 06/13/2014   HGB 12.4 06/13/2014   HCT 38.5 06/13/2014   MCV 87.2 06/13/2014   PLT 203.0 06/13/2014      Component Value Date/Time   NA 143 06/13/2014 0917   NA 145* 10/25/2012 1117   K 3.6 06/13/2014 0917   CL 109 06/13/2014 0917   CO2 27 06/13/2014 0917   GLUCOSE 119* 06/13/2014 0917    GLUCOSE 105* 10/25/2012 1117   BUN 27* 06/13/2014 [id number]  BUN 24 10/25/2012 1117   CREATININE 1.17 06/13/2014 0917   CALCIUM 9.1 06/13/2014 0917   PROT 7.2 12/19/2013 1009   PROT 6.4 10/25/2012 1117   ALBUMIN 3.9 12/19/2013 1009   AST 15 12/19/2013 1009   ALT 14 12/19/2013 1009   ALKPHOS 67 12/19/2013 1009   BILITOT 0.6 12/19/2013 1009   GFRNONAA 60 10/25/2012 1117   GFRAA 69 10/25/2012 1117   Lab Results  Component Value Date   CHOL 156 12/19/2013   HDL 49 12/19/2013   LDLCALC 96 12/19/2013   LDLDIRECT 169.4 05/02/2007   TRIG 56 12/19/2013   CHOLHDL 4 10/10/2012   Lab Results  Component Value Date   HGBA1C 5.8 06/29/2014   Lab Results  Component Value Date   [id number] 02/24/2013   Lab Results  Component Value Date   TSH 3.16 12/19/2013    ASSESSMENT AND PLAN  64 y.o. year old female  has a past medical history of  obesity, diabetic peripheral neuropathy, vitamin B12 deficiency, continue complains of bilateral lower extremity paresthesia, frequent muscle cramping, sedentary lifestyle because of morbid obesity  Continue  B12 injections Continue Topamax and Cymbalta, add on gabapentin 300 mg 3 times a day, Encourage her to be more active, Return to clinic in 6 months with Rhae Hammock, M.D. Ph.D.  [facility] [place], [place] [address] Phone: [phone] Fax:      [phone]

## 2014-07-12 ENCOUNTER — Telehealth: Payer: Self-pay | Admitting: Internal Medicine

## 2014-07-12 NOTE — Telephone Encounter (Signed)
Hopp, i think the rx was sent in with instructions for patient to take once daily, but after looking at your office note, patient was instructed to take bid--now patient is out of med and insurance won't approve covering this med this early--too soon for refill----please advise, thanks

## 2014-07-12 NOTE — Telephone Encounter (Signed)
Patient is out of omeprazole (PRILOSEC) 20 MG capsule [id number]. The prescription was sent over as taking 1 a day and you instructed her to take 2 a day. Insurance will not refill until end of June. Please advise

## 2014-07-12 NOTE — Telephone Encounter (Signed)
Use coupons in paper for OTC Omeprazole until GI appt Verify appt

## 2014-07-12 NOTE — Telephone Encounter (Signed)
Per julia/hopper---i called patient to advise that OTC prilosec is same dosage as prescription version---patient can continue taking 1 20mg  tab bid until she sees GI doctor on July 20th and then follow GI doctor instructions, patient stated this med and dosage was working well for her and she repeated instructions for understanding

## 2014-08-03 ENCOUNTER — Ambulatory Visit (INDEPENDENT_AMBULATORY_CARE_PROVIDER_SITE_OTHER): Payer: Medicare PPO | Admitting: *Deleted

## 2014-08-03 DIAGNOSIS — E538 Deficiency of other specified B group vitamins: Secondary | ICD-10-CM

## 2014-08-03 NOTE — Progress Notes (Signed)
Pt here for B 12 injection.  Under aseptic technique cyanocobalamin 1000mcg/1ml IM given L deltoid.  Tolerated well.  Bandaid applied.  

## 2014-08-03 NOTE — Patient Instructions (Signed)
See next month.   Pt taken to check out as she wanted to make appts (several in advance).

## 2014-08-20 ENCOUNTER — Telehealth: Payer: Self-pay | Admitting: Neurology

## 2014-08-20 DIAGNOSIS — R269 Unspecified abnormalities of gait and mobility: Secondary | ICD-10-CM

## 2014-08-20 DIAGNOSIS — E8881 Metabolic syndrome: Secondary | ICD-10-CM

## 2014-08-20 DIAGNOSIS — G629 Polyneuropathy, unspecified: Secondary | ICD-10-CM

## 2014-08-20 NOTE — Telephone Encounter (Signed)
Patient is calling in regard her knees and legs as she is having difficulty getting in and out of her car.  Please call.

## 2014-08-21 NOTE — Telephone Encounter (Signed)
She would like to get set up for home physical therapy.

## 2014-08-22 ENCOUNTER — Ambulatory Visit (INDEPENDENT_AMBULATORY_CARE_PROVIDER_SITE_OTHER): Payer: Medicare PPO | Admitting: Internal Medicine

## 2014-08-22 ENCOUNTER — Encounter: Payer: Self-pay | Admitting: Internal Medicine

## 2014-08-22 VITALS — BP 128/60 | HR 82

## 2014-08-22 DIAGNOSIS — R140 Abdominal distension (gaseous): Secondary | ICD-10-CM | POA: Diagnosis not present

## 2014-08-22 DIAGNOSIS — K219 Gastro-esophageal reflux disease without esophagitis: Secondary | ICD-10-CM | POA: Diagnosis not present

## 2014-08-22 DIAGNOSIS — R6881 Early satiety: Secondary | ICD-10-CM | POA: Diagnosis not present

## 2014-08-22 DIAGNOSIS — R110 Nausea: Secondary | ICD-10-CM | POA: Diagnosis not present

## 2014-08-22 DIAGNOSIS — K5900 Constipation, unspecified: Secondary | ICD-10-CM

## 2014-08-22 DIAGNOSIS — Z800 Family history of malignant neoplasm of digestive organs: Secondary | ICD-10-CM

## 2014-08-22 MED ORDER — OMEPRAZOLE 40 MG PO CPDR: 40.0000 mg | DELAYED_RELEASE_CAPSULE | Freq: Two times a day (BID) | ORAL | Status: DC

## 2014-08-22 NOTE — Progress Notes (Signed)
Patient ID: April [name], female   DOB: 1950/11/12, 64 y.o.   MRN: [id number] HPI: April [name] is a 64 yo female with PMH of morbid obesity, diabetes, hypertension, hyperlipidemia, CAD, chronic lymphedema, family history of colon cancer and personal history of hyperplastic colon polyps who seen in consultation at the request of Dr. Linna Darner to evaluate intermittent dysphagia, early satiety and nausea. She is here today with her husband. She reports that she's been dealing with off and on issues with "sickness" in her stomach. She reports issues with feeling full quickly along with nausea. No vomiting. She is able to eat but no longer able to eat full meals. This is lead to decreased appetite and low energy. She reports intermittent issues with dysphagia, which can be solids and liquids. This has not been progressive. She does have occasional heartburn. Was started on ranitidine initially which did not help at all. Now on omeprazole 20 mg twice daily and with this swallowing issues have improved entirely but she continues to have her upper abdominal fullness and nausea. She is also having issues with constipation which she states require straining to pass stool. She states this leads to headache. She is worried that she will have recurrent issues with hemorrhoids. No current issues with hemorrhoids including rectal bleeding or melena. She presented had hemorrhoidal surgery but she forgets which year this was performed.  Family history notable for colon cancer in her mother and father. She did have colonoscopy in 2012 perform a Dr. Watt Climes.  Colon polyps were removed from the transverse colon found to be hyperplastic. Repeat colonoscopy was recommended in 5 years to be performed in May 2017  Past Medical History  Diagnosis Date  . Meralgia paresthetica     Dr. [name]  . CTS (carpal tunnel syndrome)   . CAD (coronary artery disease)   . Osteoarthrosis, unspecified whether generalized or localized, lower leg   .  Diabetes mellitus   . Hypertension   . Hyperlipidemia   . Morbid obesity   . PUD (peptic ulcer disease)   . Unspecified hereditary and idiopathic peripheral neuropathy   . Morbid obesity   . Type II or unspecified type diabetes mellitus without mention of complication, not stated as uncontrolled   . Carpal tunnel syndrome   . Asthma   . Colon polyp, hyperplastic 2007 & 2012  . Anemia     Past Surgical History  Procedure Laterality Date  . Abdominal hysterectomy    . Dilation and curettage of uterus      multiple  . Tonsillectomy and adenoidectomy    . Cardiac catheterization  2002    non obstructive disease  . Renal calculi  12/1997    SVT with induction of anesthesia  . Colonoscopy with polypectomy  2007 & 2012     hyperplastic ;Dr Watt Climes    Outpatient Prescriptions Prior to Visit  Medication Sig Dispense Refill  . albuterol (PROVENTIL HFA) 108 (90 BASE) MCG/ACT inhaler Inhale 2 puffs into the lungs every 4 (four) hours as needed for wheezing or shortness of breath. 1 Inhaler 4  . amLODipine (NORVASC) 5 MG tablet Take 1 tablet (5 mg total) by mouth daily. If BP not < 140/90 with increased BP meds 90 tablet 3  . aspirin (ECOTRIN LOW STRENGTH) 81 MG EC tablet Take 81 mg by mouth daily.      . budesonide-formoterol (SYMBICORT) 160-4.5 MCG/ACT inhaler one - two inhalations every 12 hours; gargle and spit after use 1 Inhaler 4  .  carvedilol (COREG) 25 MG tablet TAKE 1 TABLET BY MOUTH EVERY MORNING AND ONE-HALF TAB EVERY EVENING 135 tablet 2  . Cod Liver Oil CAPS Take 2 capsules by mouth daily.    . DULoxetine (CYMBALTA) 60 MG capsule Take 1 capsule (60 mg total) by mouth daily. 30 capsule 12  . Flaxseed, Linseed, (FLAX SEED OIL) 1000 MG CAPS Take by mouth 3 (three) times daily.      . folic acid (FOLVITE) Q000111Q MCG tablet Take 800 mcg by mouth daily.    . furosemide (LASIX) 40 MG tablet take 1 tablet by mouth once daily if needed 90 tablet 1  . gabapentin (NEURONTIN) 300 MG capsule  Take 1 capsule (300 mg total) by mouth 3 (three) times daily. 90 capsule 6  . glucose blood (ONE TOUCH ULTRA TEST) test strip Use as instructed 100 each 3  . hydrALAZINE (APRESOLINE) 50 MG tablet Take 1 tab by mouth every 8 hrs 270 tablet 1  . Lancets (ONETOUCH ULTRASOFT) lancets Use as instructed 100 each 3  . Multiple Vitamin (MULTIVITAMIN) tablet Take 1 tablet by mouth daily.      . Omega-3 Fatty Acids (FISH OIL) 1000 MG CAPS Take by mouth daily.      Marland Kitchen omeprazole (PRILOSEC) 20 MG capsule Take 1 capsule (20 mg total) by mouth daily. 60 capsule 1  . simvastatin (ZOCOR) 40 MG tablet Take 1 tablet (40 mg total) by mouth daily. 90 tablet 2  . spironolactone (ALDACTONE) 25 MG tablet take 1 tablet by mouth twice a day 180 tablet 1  . topiramate (TOPAMAX) 100 MG tablet Take 1 tablet (100 mg total) by mouth 2 (two) times daily. 60 tablet 11  . traMADol (ULTRAM) 50 MG tablet Take 1 tablet (50 mg total) by mouth every 8 (eight) hours as needed. 30 tablet 0  . verapamil (VERELAN PM) 360 MG 24 hr capsule Take 1 capsule (360 mg total) by mouth daily. 90 capsule 1   Facility-Administered Medications Prior to Visit  Medication Dose Route Frequency Provider Last Rate Last Dose  . cyanocobalamin ((VITAMIN B-12)) injection 1,000 mcg  1,000 mcg Intramuscular Q30 days Marcial Pacas, MD   1,000 mcg at 08/03/14 0844    Allergies  Allergen Reactions  . Penicillins     REACTION: rash  . Benazepril Hcl   . Hydrochlorothiazide W-Triamterene     REACTION: hypokalemia  . Metronidazole   . Spironolactone     REACTION: kidney problems  . Sulfonamide Derivatives     REACTION: internal "burning"  . Torsemide   . Valsartan     Family History  Problem Relation Age of Onset  . Colon cancer Mother   . Prostate cancer Father   . Colon cancer Father   . Diabetes Maternal Aunt   . Diabetes Maternal Uncle   . Diabetes Paternal Aunt   . Stroke Paternal Aunt     > 65  . Heart disease Paternal Aunt   . Diabetes  Paternal Uncle   . Breast cancer Maternal Aunt      X 2    History  Substance Use Topics  . Smoking status: Never Smoker   . Smokeless tobacco: Never Used  . Alcohol Use: No    ROS: As per history of present illness, otherwise negative  BP 128/60 mmHg  Pulse 82  Ht   Wt  Constitutional: Obese female in wheelchair. No distress. HEENT: Normocephalic and atraumatic. Oropharynx is clear and moist. No oropharyngeal exudate. Conjunctivae are normal.  No scleral icterus. Neck: Neck supple. Trachea midline. Cardiovascular: Normal rate, regular rhythm and intact distal pulses. Pulmonary/chest: Effort normal and breath sounds normal. No wheezing, rales or rhonchi. Abdominal: Soft, exam severely limited by body habitus, nontender, Bowel sounds active throughout Extremities: no clubbing, cyanosis, bilateral lower extremity lymphedema Neurological: Alert and oriented to person place and time. Skin: Skin is warm and dry. No rashes noted. Psychiatric: Normal mood and affect. Behavior is normal.  RELEVANT LABS AND IMAGING: CBC    Component Value Date/Time   WBC 7.8 06/13/2014 0917   RBC 4.42 06/13/2014 0917   HGB 12.4 06/13/2014 0917   HCT 38.5 06/13/2014 0917   PLT 203.0 06/13/2014 0917   MCV 87.2 06/13/2014 0917   MCHC 32.2 06/13/2014 0917   RDW 16.1* 06/13/2014 0917   LYMPHSABS 1.2 06/13/2014 0917   MONOABS 0.5 06/13/2014 0917   EOSABS 0.0 06/13/2014 0917   BASOSABS 0.0 06/13/2014 0917    CMP     Component Value Date/Time   NA 143 06/13/2014 0917   NA 145* 10/25/2012 1117   K 3.6 06/13/2014 0917   CL 109 06/13/2014 0917   CO2 27 06/13/2014 0917   GLUCOSE 119* 06/13/2014 0917   GLUCOSE 105* 10/25/2012 1117   BUN 27* 06/13/2014 0917   BUN 24 10/25/2012 1117   CREATININE 1.17 06/13/2014 0917   CALCIUM 9.1 06/13/2014 0917   PROT 7.2 12/19/2013 1009   PROT 6.4 10/25/2012 1117   ALBUMIN 3.9 12/19/2013 1009   AST 15 12/19/2013 1009   ALT 14 12/19/2013 1009   ALKPHOS 67  12/19/2013 1009   BILITOT 0.6 12/19/2013 1009   GFRNONAA 60 10/25/2012 1117   GFRAA 69 10/25/2012 1117    ASSESSMENT/PLAN: 64 yo female with PMH of morbid obesity, diabetes, hypertension, hyperlipidemia, CAD, chronic lymphedema, family history of colon cancer and personal history of hyperplastic colon polyps who seen in consultation at the request of Dr. Linna Darner to evaluate intermittent dysphagia, early satiety and nausea.   1. Nausea/early satiety/GERD -- her symptoms are clinically most insistent with gastroparesis, in her case likely diabetic related. It is possible that her GERD and dyspeptic symptoms are incompletely controlled with omeprazole. Will increase omeprazole to 40 mg twice a day for 1-2 months. Gastric emptying scan recommended. If symptoms persist despite increased medication and gastroparesis ruled out by scan, my recommendation is for upper endoscopy. This would need to be performed in the hospital setting with monitored anesthesia care. H. pylori antibody recently checked by primary care and negative. There is no evidence for anemia  2. Constipation -- chronic, begin MiraLAX 17 g daily. If no response may need prescription laxative  3. Family history of colon cancer -- currently up-to-date with colorectal cancer screening with last colonoscopy being 4 years ago. Repeat colonoscopy recommended for screening in May 2017    Cc:Hendricks Limes, Md [address], [place] [address]

## 2014-08-22 NOTE — Patient Instructions (Signed)
You have been scheduled for a gastric emptying scan at [facility] on 09/11/2014 at 7:30am. Please arrive at least 15 minutes prior to your appointment for registration. Please make certain not to have anything to eat or drink after midnight the night before your test. Hold all stomach medications (ex: Zofran, phenergan, Reglan) 48 hours prior to your test. If you need to reschedule your appointment, please contact radiology scheduling at [phone]. _____________________________________________________________________ A gastric-emptying study measures how long it takes for food to move through your stomach. There are several ways to measure stomach emptying. In the most common test, you eat food that contains a small amount of radioactive material. A scanner that detects the movement of the radioactive material is placed over your abdomen to monitor the rate at which food leaves your stomach. This test normally takes about 2 hours to complete. _____________________________________________________________________   Use Miralax 17 grams daily in water or juice Follow up in 12 weeks We have sent in your new prescription of omeprazole to your pharmacy

## 2014-08-22 NOTE — Telephone Encounter (Signed)
Ok to place order to home PT?

## 2014-08-26 NOTE — Telephone Encounter (Signed)
Home PT order placed.

## 2014-08-27 NOTE — Telephone Encounter (Signed)
Patient called regarding home PT. Please call and advise. Patient can be reached at [phone].

## 2014-08-27 NOTE — Telephone Encounter (Signed)
Referral request sent to [facility].

## 2014-08-29 ENCOUNTER — Telehealth: Payer: Self-pay | Admitting: Nurse Practitioner

## 2014-08-29 DIAGNOSIS — G629 Polyneuropathy, unspecified: Secondary | ICD-10-CM | POA: Diagnosis not present

## 2014-08-29 DIAGNOSIS — E8881 Metabolic syndrome: Secondary | ICD-10-CM | POA: Diagnosis not present

## 2014-08-29 DIAGNOSIS — M170 Bilateral primary osteoarthritis of knee: Secondary | ICD-10-CM | POA: Diagnosis not present

## 2014-08-29 DIAGNOSIS — E119 Type 2 diabetes mellitus without complications: Secondary | ICD-10-CM | POA: Diagnosis not present

## 2014-08-29 DIAGNOSIS — I10 Essential (primary) hypertension: Secondary | ICD-10-CM | POA: Diagnosis not present

## 2014-08-29 DIAGNOSIS — K219 Gastro-esophageal reflux disease without esophagitis: Secondary | ICD-10-CM | POA: Diagnosis not present

## 2014-08-29 DIAGNOSIS — I5032 Chronic diastolic (congestive) heart failure: Secondary | ICD-10-CM | POA: Diagnosis not present

## 2014-08-29 DIAGNOSIS — I2510 Atherosclerotic heart disease of native coronary artery without angina pectoris: Secondary | ICD-10-CM | POA: Diagnosis not present

## 2014-08-29 NOTE — Telephone Encounter (Addendum)
[facility] called and is requesting a skilled nurse for medication teaching and edema control. Home health pt  Order - 2 x week for 1week -   3 x week for 2 weeks -  2 x week for 4 weeks. Pt will need bariatric  hospital bed, and  Bariatric rolling walker , [phone], Drucilla Chalet: it is ok to proceed with suggested.  Marcial Pacas, M.D. Ph.D.  [facility] [place], [place] [address] Phone: [phone] Fax:      [phone]

## 2014-08-30 NOTE — Telephone Encounter (Signed)
Left message for Shanon Brow to return my call.

## 2014-08-30 NOTE — Telephone Encounter (Signed)
Left message on David's personal voicemail giving him Dr. Rhea Belton verbal agreement to the requested orders below.  I ask him to call me back if he needs these orders faxed to him.

## 2014-08-30 NOTE — Telephone Encounter (Signed)
Shanon Brow returned call and requested to talk to [facility]

## 2014-08-31 DIAGNOSIS — E119 Type 2 diabetes mellitus without complications: Secondary | ICD-10-CM | POA: Diagnosis not present

## 2014-08-31 DIAGNOSIS — M170 Bilateral primary osteoarthritis of knee: Secondary | ICD-10-CM | POA: Diagnosis not present

## 2014-08-31 DIAGNOSIS — K219 Gastro-esophageal reflux disease without esophagitis: Secondary | ICD-10-CM | POA: Diagnosis not present

## 2014-08-31 DIAGNOSIS — G629 Polyneuropathy, unspecified: Secondary | ICD-10-CM | POA: Diagnosis not present

## 2014-08-31 DIAGNOSIS — I10 Essential (primary) hypertension: Secondary | ICD-10-CM | POA: Diagnosis not present

## 2014-08-31 DIAGNOSIS — I5032 Chronic diastolic (congestive) heart failure: Secondary | ICD-10-CM | POA: Diagnosis not present

## 2014-08-31 DIAGNOSIS — E8881 Metabolic syndrome: Secondary | ICD-10-CM | POA: Diagnosis not present

## 2014-08-31 DIAGNOSIS — I2510 Atherosclerotic heart disease of native coronary artery without angina pectoris: Secondary | ICD-10-CM | POA: Diagnosis not present

## 2014-09-01 DIAGNOSIS — I5032 Chronic diastolic (congestive) heart failure: Secondary | ICD-10-CM | POA: Diagnosis not present

## 2014-09-01 DIAGNOSIS — E8881 Metabolic syndrome: Secondary | ICD-10-CM | POA: Diagnosis not present

## 2014-09-01 DIAGNOSIS — I2510 Atherosclerotic heart disease of native coronary artery without angina pectoris: Secondary | ICD-10-CM | POA: Diagnosis not present

## 2014-09-01 DIAGNOSIS — G629 Polyneuropathy, unspecified: Secondary | ICD-10-CM | POA: Diagnosis not present

## 2014-09-01 DIAGNOSIS — K219 Gastro-esophageal reflux disease without esophagitis: Secondary | ICD-10-CM | POA: Diagnosis not present

## 2014-09-01 DIAGNOSIS — I10 Essential (primary) hypertension: Secondary | ICD-10-CM | POA: Diagnosis not present

## 2014-09-01 DIAGNOSIS — M170 Bilateral primary osteoarthritis of knee: Secondary | ICD-10-CM | POA: Diagnosis not present

## 2014-09-01 DIAGNOSIS — E119 Type 2 diabetes mellitus without complications: Secondary | ICD-10-CM | POA: Diagnosis not present

## 2014-09-03 ENCOUNTER — Ambulatory Visit (INDEPENDENT_AMBULATORY_CARE_PROVIDER_SITE_OTHER): Payer: Medicare PPO | Admitting: *Deleted

## 2014-09-03 DIAGNOSIS — M170 Bilateral primary osteoarthritis of knee: Secondary | ICD-10-CM | POA: Diagnosis not present

## 2014-09-03 DIAGNOSIS — I10 Essential (primary) hypertension: Secondary | ICD-10-CM | POA: Diagnosis not present

## 2014-09-03 DIAGNOSIS — G629 Polyneuropathy, unspecified: Secondary | ICD-10-CM | POA: Diagnosis not present

## 2014-09-03 DIAGNOSIS — E538 Deficiency of other specified B group vitamins: Secondary | ICD-10-CM

## 2014-09-03 DIAGNOSIS — K219 Gastro-esophageal reflux disease without esophagitis: Secondary | ICD-10-CM | POA: Diagnosis not present

## 2014-09-03 DIAGNOSIS — I2510 Atherosclerotic heart disease of native coronary artery without angina pectoris: Secondary | ICD-10-CM | POA: Diagnosis not present

## 2014-09-03 DIAGNOSIS — I5032 Chronic diastolic (congestive) heart failure: Secondary | ICD-10-CM | POA: Diagnosis not present

## 2014-09-03 DIAGNOSIS — E119 Type 2 diabetes mellitus without complications: Secondary | ICD-10-CM | POA: Diagnosis not present

## 2014-09-03 DIAGNOSIS — E8881 Metabolic syndrome: Secondary | ICD-10-CM | POA: Diagnosis not present

## 2014-09-05 DIAGNOSIS — M170 Bilateral primary osteoarthritis of knee: Secondary | ICD-10-CM | POA: Diagnosis not present

## 2014-09-05 DIAGNOSIS — I5032 Chronic diastolic (congestive) heart failure: Secondary | ICD-10-CM | POA: Diagnosis not present

## 2014-09-05 DIAGNOSIS — G629 Polyneuropathy, unspecified: Secondary | ICD-10-CM | POA: Diagnosis not present

## 2014-09-05 DIAGNOSIS — I2510 Atherosclerotic heart disease of native coronary artery without angina pectoris: Secondary | ICD-10-CM | POA: Diagnosis not present

## 2014-09-05 DIAGNOSIS — E119 Type 2 diabetes mellitus without complications: Secondary | ICD-10-CM | POA: Diagnosis not present

## 2014-09-05 DIAGNOSIS — K219 Gastro-esophageal reflux disease without esophagitis: Secondary | ICD-10-CM | POA: Diagnosis not present

## 2014-09-05 DIAGNOSIS — I10 Essential (primary) hypertension: Secondary | ICD-10-CM | POA: Diagnosis not present

## 2014-09-05 DIAGNOSIS — E8881 Metabolic syndrome: Secondary | ICD-10-CM | POA: Diagnosis not present

## 2014-09-06 DIAGNOSIS — M1712 Unilateral primary osteoarthritis, left knee: Secondary | ICD-10-CM | POA: Diagnosis not present

## 2014-09-07 DIAGNOSIS — I2510 Atherosclerotic heart disease of native coronary artery without angina pectoris: Secondary | ICD-10-CM | POA: Diagnosis not present

## 2014-09-07 DIAGNOSIS — I5032 Chronic diastolic (congestive) heart failure: Secondary | ICD-10-CM | POA: Diagnosis not present

## 2014-09-07 DIAGNOSIS — M170 Bilateral primary osteoarthritis of knee: Secondary | ICD-10-CM | POA: Diagnosis not present

## 2014-09-07 DIAGNOSIS — E119 Type 2 diabetes mellitus without complications: Secondary | ICD-10-CM | POA: Diagnosis not present

## 2014-09-07 DIAGNOSIS — E8881 Metabolic syndrome: Secondary | ICD-10-CM | POA: Diagnosis not present

## 2014-09-07 DIAGNOSIS — K219 Gastro-esophageal reflux disease without esophagitis: Secondary | ICD-10-CM | POA: Diagnosis not present

## 2014-09-07 DIAGNOSIS — I10 Essential (primary) hypertension: Secondary | ICD-10-CM | POA: Diagnosis not present

## 2014-09-07 DIAGNOSIS — G629 Polyneuropathy, unspecified: Secondary | ICD-10-CM | POA: Diagnosis not present

## 2014-09-10 ENCOUNTER — Telehealth: Payer: Self-pay | Admitting: *Deleted

## 2014-09-10 DIAGNOSIS — K219 Gastro-esophageal reflux disease without esophagitis: Secondary | ICD-10-CM | POA: Diagnosis not present

## 2014-09-10 DIAGNOSIS — E8881 Metabolic syndrome: Secondary | ICD-10-CM | POA: Diagnosis not present

## 2014-09-10 DIAGNOSIS — M170 Bilateral primary osteoarthritis of knee: Secondary | ICD-10-CM | POA: Diagnosis not present

## 2014-09-10 DIAGNOSIS — I5032 Chronic diastolic (congestive) heart failure: Secondary | ICD-10-CM | POA: Diagnosis not present

## 2014-09-10 DIAGNOSIS — G629 Polyneuropathy, unspecified: Secondary | ICD-10-CM | POA: Diagnosis not present

## 2014-09-10 DIAGNOSIS — I2510 Atherosclerotic heart disease of native coronary artery without angina pectoris: Secondary | ICD-10-CM | POA: Diagnosis not present

## 2014-09-10 DIAGNOSIS — I10 Essential (primary) hypertension: Secondary | ICD-10-CM | POA: Diagnosis not present

## 2014-09-10 DIAGNOSIS — E119 Type 2 diabetes mellitus without complications: Secondary | ICD-10-CM | POA: Diagnosis not present

## 2014-09-10 MED ORDER — AMLODIPINE BESYLATE 5 MG PO TABS: 5.0000 mg | ORAL_TABLET | Freq: Every day | ORAL | Status: DC

## 2014-09-10 NOTE — Telephone Encounter (Signed)
Pt states she is needing refill on her amlodipine. Verified pharmacy inform [facility]...[name]

## 2014-09-11 ENCOUNTER — Ambulatory Visit (HOSPITAL_COMMUNITY): Payer: Medicare PPO

## 2014-09-11 DIAGNOSIS — M1711 Unilateral primary osteoarthritis, right knee: Secondary | ICD-10-CM | POA: Diagnosis not present

## 2014-09-12 ENCOUNTER — Encounter: Payer: Self-pay | Admitting: Neurology

## 2014-09-12 ENCOUNTER — Ambulatory Visit (INDEPENDENT_AMBULATORY_CARE_PROVIDER_SITE_OTHER): Payer: Medicare PPO | Admitting: Neurology

## 2014-09-12 VITALS — BP 142/71 | HR 70 | Ht 64.0 in | Wt >= 6400 oz

## 2014-09-12 DIAGNOSIS — E538 Deficiency of other specified B group vitamins: Secondary | ICD-10-CM | POA: Diagnosis not present

## 2014-09-12 DIAGNOSIS — K219 Gastro-esophageal reflux disease without esophagitis: Secondary | ICD-10-CM | POA: Diagnosis not present

## 2014-09-12 DIAGNOSIS — I2510 Atherosclerotic heart disease of native coronary artery without angina pectoris: Secondary | ICD-10-CM | POA: Diagnosis not present

## 2014-09-12 DIAGNOSIS — I5032 Chronic diastolic (congestive) heart failure: Secondary | ICD-10-CM | POA: Diagnosis not present

## 2014-09-12 DIAGNOSIS — R269 Unspecified abnormalities of gait and mobility: Secondary | ICD-10-CM

## 2014-09-12 DIAGNOSIS — G629 Polyneuropathy, unspecified: Secondary | ICD-10-CM | POA: Diagnosis not present

## 2014-09-12 DIAGNOSIS — M170 Bilateral primary osteoarthritis of knee: Secondary | ICD-10-CM | POA: Diagnosis not present

## 2014-09-12 DIAGNOSIS — I10 Essential (primary) hypertension: Secondary | ICD-10-CM | POA: Diagnosis not present

## 2014-09-12 DIAGNOSIS — E119 Type 2 diabetes mellitus without complications: Secondary | ICD-10-CM | POA: Diagnosis not present

## 2014-09-12 DIAGNOSIS — E8881 Metabolic syndrome: Secondary | ICD-10-CM | POA: Diagnosis not present

## 2014-09-12 NOTE — Progress Notes (Signed)
Chief Complaint  Patient presents with  . Vitamin B-12 Deficiency    She is here with her husband, April [name].  She needs a face-to-face visit for medically necessary equipment to be provided.  She is concerned about swelling and bed sores she is developing from having to sit in her recliner for hours.   Chief Complaint  Patient presents with  . Vitamin B-12 Deficiency    She is here with her husband, April [name].  She needs a face-to-face visit for medically necessary equipment to be provided.  She is concerned about swelling and bed sores she is developing from having to sit in her recliner for hours.     [facility]  PATIENT: April [name] DOB: February 10, 1950  HISTORY OF PRESENT ILLNESS:  HISTORY ( Initial evaluation 10/25/2012): evaluation of bilateral muscle cramping.  She had past medical history of obesity, diabetes, hyperlipidemia, hypertension, presenting with bilateral feet paresthesia muscle cramping.   saw her previously for diabetic peripheral neuropathy, she complained of bilateral knee pain, bilateral feet paresthesia, difficulty bearing weight, gait difficulty, EMG nerve conduction study in January 2013 confirmed the diagnosis of axonal peripheral neuropathy,   She has been taking Topamax 100 mg twice a day for bilateral feet paresthesia, previous laboratory evaluation demonstrated normal sodium, potassium, magnesium, CPK of 92.  She continued to suffer severe morbid obesity, continued to have low back pain, gait difficulty, bilateral knee pain, recent in one year, she also began to suffer bilateral feet muscle cramping, intermittent, severe, lasting 7-8 minutes, happen about once a week. It often involves her feet muscles, also involves her thigh muscles.  She continued to have significant obesity, gait difficulty.  Lab showed low B12 183 October 2014, received B12 shot, now monthly, repeat B12 level January 2015 was normal 622  She came in with wheelchair, last visit  was with Hoyle Sauer in January 2015, she recently developed acid reflux, now on prilosec, mild difficulty swallowing. She still has feet swelling, bilateral feet numbness, burning sensation, frequent bilateral feet, and leg muscle crampiness, couple times a week, woke her up from sleep, very painful, she is taking Topamax 100 mg twice a day, which seems to help her some,  She has become more sedentary cause of her weight, knee pain, sitting in wheelchair most of the time  UPDATE August 10th 2016: April [name] is currently receiving home physical therapy, who has suggested hospital bed and gel overlay, lower air loss mattress, new walker, overhead trapezius bar, because of her limited mobility,obesity, orthostatic dizziness, multiple joints pain, especially right knee pain, bilateral feet paresthesia, needle prick pain.  She also developed bilateral posterior thigh area skin abrasion, pressor ulcers.  REVIEW OF SYSTEMS: Full 14 system review of systems performed and notable only for those listed, all others are neg:     ALLERGIES: Allergies  Allergen Reactions  . Penicillins     REACTION: rash  . Benazepril Hcl   . Hydrochlorothiazide W-Triamterene     REACTION: hypokalemia  . Metronidazole   . Spironolactone     REACTION: kidney problems  . Sulfonamide Derivatives     REACTION: internal "burning"  . Torsemide   . Valsartan     HOME MEDICATIONS: Outpatient Prescriptions Prior to Visit  Medication Sig Dispense Refill  . albuterol (PROVENTIL HFA) 108 (90 BASE) MCG/ACT inhaler Inhale 2 puffs into the lungs every 4 (four) hours as needed for wheezing or shortness of breath. 1 Inhaler 4  . amLODipine (NORVASC) 5 MG tablet Take 1 tablet (5  mg total) by mouth daily. If BP not < 140/90 with increased BP meds 90 tablet 1  . aspirin (ECOTRIN LOW STRENGTH) 81 MG EC tablet Take 81 mg by mouth daily.      . budesonide-formoterol (SYMBICORT) 160-4.5 MCG/ACT inhaler one - two inhalations every 12  hours; gargle and spit after use 1 Inhaler 4  . carvedilol (COREG) 25 MG tablet TAKE 1 TABLET BY MOUTH EVERY MORNING AND ONE-HALF TAB EVERY EVENING 135 tablet 2  . Cod Liver Oil CAPS Take 2 capsules by mouth daily.    . DULoxetine (CYMBALTA) 60 MG capsule Take 1 capsule (60 mg total) by mouth daily. 30 capsule 12  . Flaxseed, Linseed, (FLAX SEED OIL) 1000 MG CAPS Take by mouth 3 (three) times daily.      . folic acid (FOLVITE) Q000111Q MCG tablet Take 800 mcg by mouth daily.    . furosemide (LASIX) 40 MG tablet take 1 tablet by mouth once daily if needed 90 tablet 1  . gabapentin (NEURONTIN) 300 MG capsule Take 1 capsule (300 mg total) by mouth 3 (three) times daily. 90 capsule 6  . glucose blood (ONE TOUCH ULTRA TEST) test strip Use as instructed 100 each 3  . hydrALAZINE (APRESOLINE) 50 MG tablet Take 1 tab by mouth every 8 hrs 270 tablet 1  . Lancets (ONETOUCH ULTRASOFT) lancets Use as instructed 100 each 3  . Multiple Vitamin (MULTIVITAMIN) tablet Take 1 tablet by mouth daily.      . Omega-3 Fatty Acids (FISH OIL) 1000 MG CAPS Take by mouth daily.      Marland Kitchen omeprazole (PRILOSEC) 40 MG capsule Take 1 capsule (40 mg total) by mouth 2 (two) times daily. 60 capsule 6  . simvastatin (ZOCOR) 40 MG tablet Take 1 tablet (40 mg total) by mouth daily. 90 tablet 2  . spironolactone (ALDACTONE) 25 MG tablet take 1 tablet by mouth twice a day 180 tablet 1  . topiramate (TOPAMAX) 100 MG tablet Take 1 tablet (100 mg total) by mouth 2 (two) times daily. 60 tablet 11  . traMADol (ULTRAM) 50 MG tablet Take 1 tablet (50 mg total) by mouth every 8 (eight) hours as needed. 30 tablet 0  . verapamil (VERELAN PM) 360 MG 24 hr capsule Take 1 capsule (360 mg total) by mouth daily. 90 capsule 1  . omeprazole (PRILOSEC) 20 MG capsule Take 1 capsule (20 mg total) by mouth daily. 60 capsule 1   Facility-Administered Medications Prior to Visit  Medication Dose Route Frequency Provider Last Rate Last Dose  . cyanocobalamin  ((VITAMIN B-12)) injection 1,000 mcg  1,000 mcg Intramuscular Q30 days Marcial Pacas, MD   1,000 mcg at 09/03/14 1808    PAST MEDICAL HISTORY: Past Medical History  Diagnosis Date  . Meralgia paresthetica     Dr. [name]  . CTS (carpal tunnel syndrome)   . CAD (coronary artery disease)   . Osteoarthrosis, unspecified whether generalized or localized, lower leg   . Diabetes mellitus   . Hypertension   . Hyperlipidemia   . Morbid obesity   . PUD (peptic ulcer disease)   . Unspecified hereditary and idiopathic peripheral neuropathy   . Morbid obesity   . Type II or unspecified type diabetes mellitus without mention of complication, not stated as uncontrolled   . Carpal tunnel syndrome   . Asthma   . Colon polyp, hyperplastic 2007 & 2012  . Anemia     PAST SURGICAL HISTORY: Past Surgical History  Procedure Laterality Date  .  Abdominal hysterectomy    . Dilation and curettage of uterus      multiple  . Tonsillectomy and adenoidectomy    . Cardiac catheterization  2002    non obstructive disease  . Renal calculi  12/1997    SVT with induction of anesthesia  . Colonoscopy with polypectomy  2007 & 2012     hyperplastic ;Dr Watt Climes    FAMILY HISTORY: Family History  Problem Relation Age of Onset  . Colon cancer Mother   . Prostate cancer Father   . Colon cancer Father   . Diabetes Maternal Aunt   . Diabetes Maternal Uncle   . Diabetes Paternal Aunt   . Stroke Paternal Aunt     > 65  . Heart disease Paternal Aunt   . Diabetes Paternal Uncle   . Breast cancer Maternal Aunt      X 2    SOCIAL HISTORY: Social History   Social History  . Marital Status: Married    Spouse Name: April [name]  . Number of Children: 1  . Years of Education: BS   Occupational History  . Disabled    Social History Main Topics  . Smoking status: Never Smoker   . Smokeless tobacco: Never Used  . Alcohol Use: No  . Drug Use: No  . Sexual Activity: Not on file   Other Topics Concern  . Not on  file   Social History Narrative   Patient lives at home with her husband April [name]) . Patient is retired and has a Conservation officer, nature.    Caffeine - some times.   Right handed.     PHYSICAL EXAM  Filed Vitals:   09/12/14 0716  BP: 142/71  Pulse: 70  Height: 5\' 4"  (1.626 m)  Weight: 409 lb 8 oz (185.748 kg)   Body mass index is 70.26 kg/(m^2).  PHYSICAL EXAMNIATION:  Gen: NAD, conversant, well nourised, obese, well groomed                     Cardiovascular: Regular rate rhythm, no peripheral edema, warm, nontender. Eyes: Conjunctivae clear without exudates or hemorrhage Neck: Supple, no carotid bruise. Pulmonary: Clear to auscultation bilaterally  Skin: Bilateral lower extremity skin discoloration, thickness, also had skin abrasion, superficial pressure ulcer at bilateral posterior thigh,  NEUROLOGICAL EXAM:  MENTAL STATUS: Speech:    Speech is normal; fluent and spontaneous with normal comprehension.  Cognition:    The patient is oriented to person, place, and time;     recent and remote memory intact;     language fluent;     normal attention, concentration,     fund of knowledge.  CRANIAL NERVES: CN II: Visual fields are full to confrontation. Pupil equal round reactive to light CN III, IV, VI: extraocular movement are normal. No ptosis. CN V: Facial sensation is intact to pinprick in all 3 divisions bilaterally. Corneal responses are intact.  CN VII: Face is symmetric with normal eye closure and smile. CN VIII: Hearing is normal to rubbing fingers CN IX, X: Palate elevates symmetrically. Phonation is normal. CN XI: Head turning and shoulder shrug are intact CN XII: Tongue is midline with normal movements and no atrophy.  MOTOR: There is no pronator drift of out-stretched arms. Muscle bulk and tone are normal. Muscle strength is normal.  REFLEXES: Hyporeflexia, absent ankle reflexes. Plantar responses are flexor.  SENSORY: Length dependent decreased  touch, pinprick to knee level, decreased vibration sense at bilateral knees  COORDINATION: Rapid  alternating movements and fine finger movements are intact. There is no dysmetria on finger-to-nose and heel-knee-shin exam is limited because of knee pain, big body habitus  GAIT/STANCE: Needless 2 people assistant to get up from seated position, antalgic gait due to right knee pain, limited mobility due to big body habitus    DIAGNOSTIC DATA (LABS, IMAGING, TESTING) - I reviewed patient records, labs, notes, testing and imaging myself where available.  Lab Results  Component Value Date   WBC 7.8 06/13/2014   HGB 12.4 06/13/2014   HCT 38.5 06/13/2014   MCV 87.2 06/13/2014   PLT 203.0 06/13/2014      Component Value Date/Time   NA 143 06/13/2014 0917   NA 145* 10/25/2012 1117   K 3.6 06/13/2014 0917   CL 109 06/13/2014 0917   CO2 27 06/13/2014 0917   GLUCOSE 119* 06/13/2014 0917   GLUCOSE 105* 10/25/2012 1117   BUN 27* 06/13/2014 0917   BUN 24 10/25/2012 1117   CREATININE 1.17 06/13/2014 0917   CALCIUM 9.1 06/13/2014 0917   PROT 7.2 12/19/2013 1009   PROT 6.4 10/25/2012 1117   ALBUMIN 3.9 12/19/2013 1009   AST 15 12/19/2013 1009   ALT 14 12/19/2013 1009   ALKPHOS 67 12/19/2013 1009   BILITOT 0.6 12/19/2013 1009   GFRNONAA 60 10/25/2012 1117   GFRAA 69 10/25/2012 1117   Lab Results  Component Value Date   CHOL 156 12/19/2013   HDL 49 12/19/2013   LDLCALC 96 12/19/2013   LDLDIRECT 169.4 05/02/2007   TRIG 56 12/19/2013   CHOLHDL 4 10/10/2012   Lab Results  Component Value Date   HGBA1C 5.8 06/29/2014   Lab Results  Component Value Date   [id number] 02/24/2013   Lab Results  Component Value Date   TSH 3.16 12/19/2013    ASSESSMENT AND PLAN  64 y.o. year old female   Mobile obesity Diabetic peripheral neuropathy Vitamin B12 deficiency Gait difficulty, limited mobility  Multifactorial, due to overweight, deconditioning, diabetic peripheral  neuropathic pain, multiple joint pain,  She would be a suitable candidate for hospital bed, gel overlay because her morbid obesity, limited mobility, difficulty repositioning of her body, she would also benefit lift her head out of bed for more than 30, because of her reported orthostatic dizziness, difficulty breathing when lying flat.  she also developed bed sore, skin abrasion with difficulty changing her position, prolonged pressure at certain pressure spots, she needs frequent change of body position,  she would benefit a lower air loss mattress   She also takes diuretic, with frequent urination, limited mobility, occasionally urinary incontinence, has increased chance of pressor ulcer , she also has significant length dependent sensory loss, venous stasis, chronic skin changes   She has difficulty getting out of bed, would benefit overhead trapezius bar and a new walker to increase the morbidity      Marcial Pacas, M.D. Ph.D.  [facility] [place], [place] [address] Phone: [phone] Fax:      [phone]

## 2014-09-13 ENCOUNTER — Ambulatory Visit (HOSPITAL_COMMUNITY)
Admission: RE | Admit: 2014-09-13 | Discharge: 2014-09-13 | Disposition: A | Discharge status: Home / Self Care | Payer: Medicare PPO | Source: Ambulatory Visit | Attending: Internal Medicine | Admitting: Internal Medicine

## 2014-09-13 DIAGNOSIS — K219 Gastro-esophageal reflux disease without esophagitis: Secondary | ICD-10-CM | POA: Insufficient documentation

## 2014-09-13 DIAGNOSIS — R109 Unspecified abdominal pain: Secondary | ICD-10-CM | POA: Insufficient documentation

## 2014-09-13 DIAGNOSIS — R6881 Early satiety: Secondary | ICD-10-CM | POA: Diagnosis not present

## 2014-09-13 DIAGNOSIS — R110 Nausea: Secondary | ICD-10-CM | POA: Diagnosis not present

## 2014-09-13 DIAGNOSIS — R140 Abdominal distension (gaseous): Secondary | ICD-10-CM | POA: Diagnosis not present

## 2014-09-13 MED ORDER — TECHNETIUM TC 99M SULFUR COLLOID
2.0000 | Freq: Once | INTRAVENOUS | Status: DC | PRN
Start: 2014-09-13 — End: 2014-09-19
  Administered 2014-09-13: 2 via INTRAVENOUS
  Filled 2014-09-13: qty 2

## 2014-09-13 NOTE — Telephone Encounter (Signed)
I have spoken with Truman Hayward this morning and advised that I see that Sharyn Lull spoke with Shanon Brow on 08-30-14 and gave v/o, but I am not able to tell if written orders were received, signed, and faxed back.  I will leave Sharyn Lull a message to address this when she returns to the office next week.  Truman Hayward verbalized understanding of same/fim

## 2014-09-13 NOTE — Telephone Encounter (Signed)
April [name] / [name] is calling to inquire if orders were faxed back yet for hospital bed and walker [phone] Fax [phone]

## 2014-09-14 ENCOUNTER — Telehealth: Payer: Self-pay | Admitting: Internal Medicine

## 2014-09-14 ENCOUNTER — Telehealth: Payer: Self-pay | Admitting: Emergency Medicine

## 2014-09-14 ENCOUNTER — Other Ambulatory Visit: Payer: Self-pay | Admitting: Emergency Medicine

## 2014-09-14 DIAGNOSIS — E119 Type 2 diabetes mellitus without complications: Secondary | ICD-10-CM | POA: Diagnosis not present

## 2014-09-14 DIAGNOSIS — I2510 Atherosclerotic heart disease of native coronary artery without angina pectoris: Secondary | ICD-10-CM | POA: Diagnosis not present

## 2014-09-14 DIAGNOSIS — I10 Essential (primary) hypertension: Secondary | ICD-10-CM | POA: Diagnosis not present

## 2014-09-14 DIAGNOSIS — E8881 Metabolic syndrome: Secondary | ICD-10-CM | POA: Diagnosis not present

## 2014-09-14 DIAGNOSIS — M170 Bilateral primary osteoarthritis of knee: Secondary | ICD-10-CM | POA: Diagnosis not present

## 2014-09-14 DIAGNOSIS — K219 Gastro-esophageal reflux disease without esophagitis: Secondary | ICD-10-CM | POA: Diagnosis not present

## 2014-09-14 DIAGNOSIS — G629 Polyneuropathy, unspecified: Secondary | ICD-10-CM | POA: Diagnosis not present

## 2014-09-14 DIAGNOSIS — I5032 Chronic diastolic (congestive) heart failure: Secondary | ICD-10-CM | POA: Diagnosis not present

## 2014-09-14 MED ORDER — FUROSEMIDE 40 MG PO TABS: ORAL_TABLET | ORAL | Status: DC

## 2014-09-14 NOTE — Telephone Encounter (Signed)
Pt called in has a few questions about her meds.  She wanted to know if a nurse could call her?     Best number [phone]

## 2014-09-14 NOTE — Telephone Encounter (Signed)
Spoke with pt to inform of medication change. Instructed pt to call for and OV if no change is noted.

## 2014-09-14 NOTE — Telephone Encounter (Signed)
Called pt to answer questions about meds. Pt stated that her home health nurse from [facility] stated that she needs to increase the Lasix, she is currently taking 40 mg daily unless she has to go to an appt. She has increased swelling right now. She is not currently taking the Spironolactone due to an allergy to the medication. She is unsure of the reaction she had with this medication. Please advise if she needs to increase her dosage for the Lasix. Her last OV was 06/13/14

## 2014-09-14 NOTE — Telephone Encounter (Signed)
Assess response to 40 mg 1& 1/2 qd X 3 days OV if no response

## 2014-09-17 DIAGNOSIS — K219 Gastro-esophageal reflux disease without esophagitis: Secondary | ICD-10-CM | POA: Diagnosis not present

## 2014-09-17 DIAGNOSIS — I5032 Chronic diastolic (congestive) heart failure: Secondary | ICD-10-CM | POA: Diagnosis not present

## 2014-09-17 DIAGNOSIS — G629 Polyneuropathy, unspecified: Secondary | ICD-10-CM | POA: Diagnosis not present

## 2014-09-17 DIAGNOSIS — I2510 Atherosclerotic heart disease of native coronary artery without angina pectoris: Secondary | ICD-10-CM | POA: Diagnosis not present

## 2014-09-17 DIAGNOSIS — I10 Essential (primary) hypertension: Secondary | ICD-10-CM | POA: Diagnosis not present

## 2014-09-17 DIAGNOSIS — E8881 Metabolic syndrome: Secondary | ICD-10-CM | POA: Diagnosis not present

## 2014-09-17 DIAGNOSIS — M170 Bilateral primary osteoarthritis of knee: Secondary | ICD-10-CM | POA: Diagnosis not present

## 2014-09-17 DIAGNOSIS — E119 Type 2 diabetes mellitus without complications: Secondary | ICD-10-CM | POA: Diagnosis not present

## 2014-09-17 NOTE — Telephone Encounter (Signed)
Orders/notes faxed and confirmed to the number below.

## 2014-09-19 DIAGNOSIS — E119 Type 2 diabetes mellitus without complications: Secondary | ICD-10-CM | POA: Diagnosis not present

## 2014-09-19 DIAGNOSIS — I5032 Chronic diastolic (congestive) heart failure: Secondary | ICD-10-CM | POA: Diagnosis not present

## 2014-09-19 DIAGNOSIS — E8881 Metabolic syndrome: Secondary | ICD-10-CM | POA: Diagnosis not present

## 2014-09-19 DIAGNOSIS — M170 Bilateral primary osteoarthritis of knee: Secondary | ICD-10-CM | POA: Diagnosis not present

## 2014-09-19 DIAGNOSIS — I2510 Atherosclerotic heart disease of native coronary artery without angina pectoris: Secondary | ICD-10-CM | POA: Diagnosis not present

## 2014-09-19 DIAGNOSIS — G629 Polyneuropathy, unspecified: Secondary | ICD-10-CM | POA: Diagnosis not present

## 2014-09-19 DIAGNOSIS — K219 Gastro-esophageal reflux disease without esophagitis: Secondary | ICD-10-CM | POA: Diagnosis not present

## 2014-09-19 DIAGNOSIS — E538 Deficiency of other specified B group vitamins: Secondary | ICD-10-CM | POA: Diagnosis not present

## 2014-09-19 DIAGNOSIS — G459 Transient cerebral ischemic attack, unspecified: Secondary | ICD-10-CM | POA: Diagnosis not present

## 2014-09-19 DIAGNOSIS — I509 Heart failure, unspecified: Secondary | ICD-10-CM | POA: Diagnosis not present

## 2014-09-19 DIAGNOSIS — I10 Essential (primary) hypertension: Secondary | ICD-10-CM | POA: Diagnosis not present

## 2014-09-19 DIAGNOSIS — R609 Edema, unspecified: Secondary | ICD-10-CM | POA: Diagnosis not present

## 2014-09-19 DIAGNOSIS — M179 Osteoarthritis of knee, unspecified: Secondary | ICD-10-CM | POA: Diagnosis not present

## 2014-09-26 ENCOUNTER — Telehealth: Payer: Self-pay | Admitting: Internal Medicine

## 2014-09-26 DIAGNOSIS — E119 Type 2 diabetes mellitus without complications: Secondary | ICD-10-CM | POA: Diagnosis not present

## 2014-09-26 DIAGNOSIS — K219 Gastro-esophageal reflux disease without esophagitis: Secondary | ICD-10-CM | POA: Diagnosis not present

## 2014-09-26 DIAGNOSIS — I10 Essential (primary) hypertension: Secondary | ICD-10-CM

## 2014-09-26 DIAGNOSIS — I5032 Chronic diastolic (congestive) heart failure: Secondary | ICD-10-CM | POA: Diagnosis not present

## 2014-09-26 DIAGNOSIS — G629 Polyneuropathy, unspecified: Secondary | ICD-10-CM | POA: Diagnosis not present

## 2014-09-26 DIAGNOSIS — M170 Bilateral primary osteoarthritis of knee: Secondary | ICD-10-CM | POA: Diagnosis not present

## 2014-09-26 DIAGNOSIS — I2510 Atherosclerotic heart disease of native coronary artery without angina pectoris: Secondary | ICD-10-CM | POA: Diagnosis not present

## 2014-09-26 DIAGNOSIS — E8881 Metabolic syndrome: Secondary | ICD-10-CM | POA: Diagnosis not present

## 2014-09-26 MED ORDER — VERAPAMIL HCL ER 360 MG PO CP24: 360.0000 mg | ORAL_CAPSULE | Freq: Every day | ORAL | Status: DC

## 2014-09-26 NOTE — Telephone Encounter (Signed)
Patient requesting refill for verapamil (VERELAN PM) 360 MG 24 hr capsule [id number]  She is out Pharmacy is [facility] on [address].

## 2014-09-26 NOTE — Telephone Encounter (Signed)
Notified pt refill has been [facility] to [facility]...April [name]

## 2014-09-28 DIAGNOSIS — G629 Polyneuropathy, unspecified: Secondary | ICD-10-CM | POA: Diagnosis not present

## 2014-09-28 DIAGNOSIS — M170 Bilateral primary osteoarthritis of knee: Secondary | ICD-10-CM | POA: Diagnosis not present

## 2014-09-28 DIAGNOSIS — E8881 Metabolic syndrome: Secondary | ICD-10-CM | POA: Diagnosis not present

## 2014-09-28 DIAGNOSIS — I10 Essential (primary) hypertension: Secondary | ICD-10-CM | POA: Diagnosis not present

## 2014-09-28 DIAGNOSIS — K219 Gastro-esophageal reflux disease without esophagitis: Secondary | ICD-10-CM | POA: Diagnosis not present

## 2014-09-28 DIAGNOSIS — I5032 Chronic diastolic (congestive) heart failure: Secondary | ICD-10-CM | POA: Diagnosis not present

## 2014-09-28 DIAGNOSIS — I2510 Atherosclerotic heart disease of native coronary artery without angina pectoris: Secondary | ICD-10-CM | POA: Diagnosis not present

## 2014-09-28 DIAGNOSIS — E119 Type 2 diabetes mellitus without complications: Secondary | ICD-10-CM | POA: Diagnosis not present

## 2014-10-01 DIAGNOSIS — I5032 Chronic diastolic (congestive) heart failure: Secondary | ICD-10-CM | POA: Diagnosis not present

## 2014-10-01 DIAGNOSIS — E8881 Metabolic syndrome: Secondary | ICD-10-CM | POA: Diagnosis not present

## 2014-10-01 DIAGNOSIS — E119 Type 2 diabetes mellitus without complications: Secondary | ICD-10-CM | POA: Diagnosis not present

## 2014-10-01 DIAGNOSIS — I2510 Atherosclerotic heart disease of native coronary artery without angina pectoris: Secondary | ICD-10-CM | POA: Diagnosis not present

## 2014-10-01 DIAGNOSIS — I10 Essential (primary) hypertension: Secondary | ICD-10-CM | POA: Diagnosis not present

## 2014-10-01 DIAGNOSIS — M170 Bilateral primary osteoarthritis of knee: Secondary | ICD-10-CM | POA: Diagnosis not present

## 2014-10-01 DIAGNOSIS — G629 Polyneuropathy, unspecified: Secondary | ICD-10-CM | POA: Diagnosis not present

## 2014-10-01 DIAGNOSIS — K219 Gastro-esophageal reflux disease without esophagitis: Secondary | ICD-10-CM | POA: Diagnosis not present

## 2014-10-03 DIAGNOSIS — G629 Polyneuropathy, unspecified: Secondary | ICD-10-CM | POA: Diagnosis not present

## 2014-10-03 DIAGNOSIS — I5032 Chronic diastolic (congestive) heart failure: Secondary | ICD-10-CM | POA: Diagnosis not present

## 2014-10-03 DIAGNOSIS — E119 Type 2 diabetes mellitus without complications: Secondary | ICD-10-CM | POA: Diagnosis not present

## 2014-10-03 DIAGNOSIS — E8881 Metabolic syndrome: Secondary | ICD-10-CM | POA: Diagnosis not present

## 2014-10-03 DIAGNOSIS — K219 Gastro-esophageal reflux disease without esophagitis: Secondary | ICD-10-CM | POA: Diagnosis not present

## 2014-10-03 DIAGNOSIS — I10 Essential (primary) hypertension: Secondary | ICD-10-CM | POA: Diagnosis not present

## 2014-10-03 DIAGNOSIS — I2510 Atherosclerotic heart disease of native coronary artery without angina pectoris: Secondary | ICD-10-CM | POA: Diagnosis not present

## 2014-10-03 DIAGNOSIS — M170 Bilateral primary osteoarthritis of knee: Secondary | ICD-10-CM | POA: Diagnosis not present

## 2014-10-04 ENCOUNTER — Ambulatory Visit (INDEPENDENT_AMBULATORY_CARE_PROVIDER_SITE_OTHER): Payer: Medicare PPO | Admitting: *Deleted

## 2014-10-04 VITALS — Wt 395.0 lb

## 2014-10-04 DIAGNOSIS — E538 Deficiency of other specified B group vitamins: Secondary | ICD-10-CM | POA: Diagnosis not present

## 2014-10-08 ENCOUNTER — Encounter (HOSPITAL_COMMUNITY): Payer: Self-pay | Admitting: *Deleted

## 2014-10-08 ENCOUNTER — Emergency Department (HOSPITAL_COMMUNITY)
Admission: EM | Admit: 2014-10-08 | Discharge: 2014-10-09 | Disposition: A | Discharge status: Home / Self Care | Payer: Medicare PPO | Attending: Emergency Medicine | Admitting: Emergency Medicine

## 2014-10-08 DIAGNOSIS — Z86010 Personal history of colonic polyps: Secondary | ICD-10-CM | POA: Insufficient documentation

## 2014-10-08 DIAGNOSIS — D649 Anemia, unspecified: Secondary | ICD-10-CM | POA: Diagnosis not present

## 2014-10-08 DIAGNOSIS — Y9301 Activity, walking, marching and hiking: Secondary | ICD-10-CM | POA: Diagnosis not present

## 2014-10-08 DIAGNOSIS — Z7982 Long term (current) use of aspirin: Secondary | ICD-10-CM | POA: Diagnosis not present

## 2014-10-08 DIAGNOSIS — I2510 Atherosclerotic heart disease of native coronary artery without angina pectoris: Secondary | ICD-10-CM | POA: Diagnosis not present

## 2014-10-08 DIAGNOSIS — G629 Polyneuropathy, unspecified: Secondary | ICD-10-CM | POA: Diagnosis not present

## 2014-10-08 DIAGNOSIS — IMO0002 Reserved for concepts with insufficient information to code with codable children: Secondary | ICD-10-CM

## 2014-10-08 DIAGNOSIS — Y9289 Other specified places as the place of occurrence of the external cause: Secondary | ICD-10-CM | POA: Diagnosis not present

## 2014-10-08 DIAGNOSIS — E785 Hyperlipidemia, unspecified: Secondary | ICD-10-CM | POA: Diagnosis not present

## 2014-10-08 DIAGNOSIS — Z23 Encounter for immunization: Secondary | ICD-10-CM | POA: Insufficient documentation

## 2014-10-08 DIAGNOSIS — E119 Type 2 diabetes mellitus without complications: Secondary | ICD-10-CM | POA: Diagnosis not present

## 2014-10-08 DIAGNOSIS — S81812A Laceration without foreign body, left lower leg, initial encounter: Secondary | ICD-10-CM | POA: Diagnosis not present

## 2014-10-08 DIAGNOSIS — J45909 Unspecified asthma, uncomplicated: Secondary | ICD-10-CM | POA: Insufficient documentation

## 2014-10-08 DIAGNOSIS — W228XXA Striking against or struck by other objects, initial encounter: Secondary | ICD-10-CM | POA: Insufficient documentation

## 2014-10-08 DIAGNOSIS — Y998 Other external cause status: Secondary | ICD-10-CM | POA: Insufficient documentation

## 2014-10-08 DIAGNOSIS — I10 Essential (primary) hypertension: Secondary | ICD-10-CM | POA: Diagnosis not present

## 2014-10-08 DIAGNOSIS — Z880 Allergy status to penicillin: Secondary | ICD-10-CM | POA: Diagnosis not present

## 2014-10-08 DIAGNOSIS — Z79899 Other long term (current) drug therapy: Secondary | ICD-10-CM | POA: Diagnosis not present

## 2014-10-08 NOTE — ED Notes (Signed)
Pt reports she was walking in the hall tonight when a sharp object cut her in lateral part of her L ankle area.  Pt reports noticing blood clots.  Denies using blood thinners. Pt is diabetic.

## 2014-10-09 ENCOUNTER — Telehealth: Payer: Self-pay | Admitting: Neurology

## 2014-10-09 MED ORDER — TETANUS-DIPHTH-ACELL PERTUSSIS 5-2.5-18.5 LF-MCG/0.5 IM SUSP
0.5000 mL | Freq: Once | INTRAMUSCULAR | Status: AC
  Administered 2014-10-09: 0.5 mL via INTRAMUSCULAR
  Filled 2014-10-09: qty 0.5

## 2014-10-09 MED ORDER — LIDOCAINE HCL (PF) 1 % IJ SOLN
2.0000 mL | Freq: Once | INTRAMUSCULAR | Status: AC
  Administered 2014-10-09: 2 mL
  Filled 2014-10-09: qty 5

## 2014-10-09 NOTE — Discharge Instructions (Signed)
Your sutures should be removed in 10 days.  Her home health nurse can do this as long as they look well-healed

## 2014-10-09 NOTE — ED Provider Notes (Signed)
CSN: [id number]     Arrival date & time 10/08/14  2320 History   First MD Initiated Contact with Patient 10/09/14 0005     Chief Complaint  Patient presents with  . Extremity Laceration     (Consider location/radiation/quality/duration/timing/severity/associated sxs/prior Treatment) HPI Comments: As a morbidly obese female was walking in her hallway when she struck her ankle on something sustaining a 3 cm linear laceration just prior to arrival.  The history is provided by the patient.    Past Medical History  Diagnosis Date  . Meralgia paresthetica     Dr. [name]  . CTS (carpal tunnel syndrome)   . CAD (coronary artery disease)   . Osteoarthrosis, unspecified whether generalized or localized, lower leg   . Diabetes mellitus   . Hypertension   . Hyperlipidemia   . Morbid obesity   . PUD (peptic ulcer disease)   . Unspecified hereditary and idiopathic peripheral neuropathy   . Morbid obesity   . Type II or unspecified type diabetes mellitus without mention of complication, not stated as uncontrolled   . Carpal tunnel syndrome   . Asthma   . Colon polyp, hyperplastic 2007 & 2012  . Anemia    Past Surgical History  Procedure Laterality Date  . Abdominal hysterectomy    . Dilation and curettage of uterus      multiple  . Tonsillectomy and adenoidectomy    . Cardiac catheterization  2002    non obstructive disease  . Renal calculi  12/1997    SVT with induction of anesthesia  . Colonoscopy with polypectomy  2007 & 2012     hyperplastic ;Dr Watt Climes   Family History  Problem Relation Age of Onset  . Colon cancer Mother   . Prostate cancer Father   . Colon cancer Father   . Diabetes Maternal Aunt   . Diabetes Maternal Uncle   . Diabetes Paternal Aunt   . Stroke Paternal Aunt     > 65  . Heart disease Paternal Aunt   . Diabetes Paternal Uncle   . Breast cancer Maternal Aunt      X 2   Social History  Substance Use Topics  . Smoking status: Never Smoker   . Smokeless  tobacco: Never Used  . Alcohol Use: No   OB History    No data available     Review of Systems  Constitutional: Negative for fever.  Skin: Positive for wound.  Neurological: Negative for dizziness and weakness.  All other systems reviewed and are negative.     Allergies  Penicillins; Benazepril hcl; Hydrochlorothiazide w-triamterene; Metronidazole; Spironolactone; Sulfonamide derivatives; Torsemide; and Valsartan  Home Medications   Prior to Admission medications   Medication Sig Start Date End Date Taking? Authorizing Provider  albuterol (PROVENTIL HFA) 108 (90 BASE) MCG/ACT inhaler Inhale 2 puffs into the lungs every 4 (four) hours as needed for wheezing or shortness of breath. 03/13/14 04/20/15 Yes Hendricks Limes, MD  amLODipine (NORVASC) 5 MG tablet Take 1 tablet (5 mg total) by mouth daily. If BP not < 140/90 with increased BP meds 09/10/14  Yes Hendricks Limes, MD  aspirin (ECOTRIN LOW STRENGTH) 81 MG EC tablet Take 81 mg by mouth daily.     Yes Historical Provider, MD  budesonide-formoterol (SYMBICORT) 160-4.5 MCG/ACT inhaler one - two inhalations every 12 hours; gargle and spit after use 03/13/14  Yes Hendricks Limes, MD  carvedilol (COREG) 25 MG tablet TAKE 1 TABLET BY MOUTH EVERY MORNING AND  ONE-HALF TAB EVERY EVENING 06/15/14  Yes Hendricks Limes, MD  [facility] Liver Oil CAPS Take 2 capsules by mouth daily.   Yes Historical Provider, MD  DULoxetine (CYMBALTA) 60 MG capsule Take 1 capsule (60 mg total) by mouth daily. 10/25/12  Yes Marcial Pacas, MD  Flaxseed, Linseed, (FLAX SEED OIL) 1000 MG CAPS Take by mouth 3 (three) times daily.     Yes Historical Provider, MD  folic acid (FOLVITE) Q000111Q MCG tablet Take 800 mcg by mouth daily.   Yes Historical Provider, MD  furosemide (LASIX) 40 MG tablet Take 1-1.5 Tablets by mouth daily 09/14/14  Yes Hendricks Limes, MD  gabapentin (NEURONTIN) 300 MG capsule Take 1 capsule (300 mg total) by mouth 3 (three) times daily. 07/04/14  Yes Marcial Pacas, MD    hydrALAZINE (APRESOLINE) 50 MG tablet Take 1 tab by mouth every 8 hrs 06/13/14  Yes Hendricks Limes, MD  Multiple Vitamin (MULTIVITAMIN) tablet Take 1 tablet by mouth daily.     Yes Historical Provider, MD  Omega-3 Fatty Acids (FISH OIL) 1000 MG CAPS Take 1 capsule by mouth daily.    Yes Historical Provider, MD  omeprazole (PRILOSEC) 40 MG capsule Take 1 capsule (40 mg total) by mouth 2 (two) times daily. 08/22/14  Yes Jerene Bears, MD  simvastatin (ZOCOR) 40 MG tablet Take 1 tablet (40 mg total) by mouth daily. 04/16/14  Yes Hendricks Limes, MD  spironolactone (ALDACTONE) 25 MG tablet take 1 tablet by mouth twice a day 03/12/14  Yes Hendricks Limes, MD  topiramate (TOPAMAX) 100 MG tablet Take 1 tablet (100 mg total) by mouth 2 (two) times daily. 07/04/14  Yes Marcial Pacas, MD  traMADol (ULTRAM) 50 MG tablet Take 1 tablet (50 mg total) by mouth every 8 (eight) hours as needed. 01/09/14  Yes Hendricks Limes, MD  verapamil (VERELAN PM) 360 MG 24 hr capsule Take 1 capsule (360 mg total) by mouth daily. 09/26/14  Yes Hendricks Limes, MD  glucose blood (ONE TOUCH ULTRA TEST) test strip Use as instructed 03/04/12   Hendricks Limes, MD  Lancets [facility] ULTRASOFT) lancets Use as instructed 03/04/12   Hendricks Limes, MD   BP 184/78 mmHg  Pulse 77  Temp(Src) 98.1 F (36.7 C) (Oral)  Resp 18  SpO2 94% Physical Exam  Constitutional: She is oriented to person, place, and time. She appears well-developed and well-nourished.  HENT:  Head: Normocephalic.  Eyes: Pupils are equal, round, and reactive to light.  Neck: Normal range of motion.  Cardiovascular: Normal rate.   Pulmonary/Chest: Effort normal.  Musculoskeletal: Normal range of motion. She exhibits tenderness. She exhibits no edema.       Legs: Neurological: She is alert and oriented to person, place, and time.  Skin: Skin is warm.  Nursing note and vitals reviewed.   ED Course  LACERATION REPAIR Date/Time: 10/09/2014 12:37 AM Performed by:  Junius Creamer Authorized by: Junius Creamer Consent: Verbal consent obtained. Written consent not obtained. Consent given by: patient Patient understanding: patient states understanding of the procedure being performed Patient identity confirmed: verbally with patient Body area: lower extremity Location details: left lower leg Laceration length: 3 cm Foreign bodies: no foreign bodies Tendon involvement: none Nerve involvement: none Vascular damage: yes Anesthesia: local infiltration Local anesthetic: lidocaine 1% without epinephrine Anesthetic total: 2 ml Patient sedated: no Preparation: Patient was prepped and draped in the usual sterile fashion. Irrigation solution: saline Irrigation method: syringe Amount of cleaning: standard Debridement:  none Degree of undermining: none Skin closure: 4-0 Prolene Number of sutures: 5 Technique: simple Approximation: loose Approximation difficulty: simple Dressing: 4x4 sterile gauze Patient tolerance: Patient tolerated the procedure well with no immediate complications   (including critical care time) Labs Review Labs Reviewed - No data to display  Imaging Review No results found. I have personally reviewed and evaluated these images and lab results as part of my medical decision-making.   EKG Interpretation None      MDM   Final diagnoses:  Laceration         Junius Creamer, NP 10/09/14 [id number]  Everlene Balls, MD 10/09/14 [id number]

## 2014-10-09 NOTE — Telephone Encounter (Signed)
PT orders signed and faxed to [facility].

## 2014-10-09 NOTE — ED Notes (Signed)
Pt states she cut her leg on an object that she was walking by. Noticed a lot of bleeding and clots after incident. She has h/o diabetes. Currently working with physical therapy at home. Last tetanus shot unknown.

## 2014-10-09 NOTE — ED Notes (Signed)
Family / husband at bedside.

## 2014-10-09 NOTE — Telephone Encounter (Signed)
Patient is calling to see if Dr [name] has signed the form for her to continue her PT.  Please call. ([phone] [id number])

## 2014-10-10 DIAGNOSIS — I2510 Atherosclerotic heart disease of native coronary artery without angina pectoris: Secondary | ICD-10-CM | POA: Diagnosis not present

## 2014-10-10 DIAGNOSIS — E8881 Metabolic syndrome: Secondary | ICD-10-CM | POA: Diagnosis not present

## 2014-10-10 DIAGNOSIS — I5032 Chronic diastolic (congestive) heart failure: Secondary | ICD-10-CM | POA: Diagnosis not present

## 2014-10-10 DIAGNOSIS — I10 Essential (primary) hypertension: Secondary | ICD-10-CM | POA: Diagnosis not present

## 2014-10-10 DIAGNOSIS — G629 Polyneuropathy, unspecified: Secondary | ICD-10-CM | POA: Diagnosis not present

## 2014-10-10 DIAGNOSIS — K219 Gastro-esophageal reflux disease without esophagitis: Secondary | ICD-10-CM | POA: Diagnosis not present

## 2014-10-10 DIAGNOSIS — M170 Bilateral primary osteoarthritis of knee: Secondary | ICD-10-CM | POA: Diagnosis not present

## 2014-10-10 DIAGNOSIS — E119 Type 2 diabetes mellitus without complications: Secondary | ICD-10-CM | POA: Diagnosis not present

## 2014-10-12 DIAGNOSIS — K219 Gastro-esophageal reflux disease without esophagitis: Secondary | ICD-10-CM | POA: Diagnosis not present

## 2014-10-12 DIAGNOSIS — E8881 Metabolic syndrome: Secondary | ICD-10-CM | POA: Diagnosis not present

## 2014-10-12 DIAGNOSIS — G629 Polyneuropathy, unspecified: Secondary | ICD-10-CM | POA: Diagnosis not present

## 2014-10-12 DIAGNOSIS — M170 Bilateral primary osteoarthritis of knee: Secondary | ICD-10-CM | POA: Diagnosis not present

## 2014-10-12 DIAGNOSIS — I2510 Atherosclerotic heart disease of native coronary artery without angina pectoris: Secondary | ICD-10-CM | POA: Diagnosis not present

## 2014-10-12 DIAGNOSIS — E119 Type 2 diabetes mellitus without complications: Secondary | ICD-10-CM | POA: Diagnosis not present

## 2014-10-12 DIAGNOSIS — I5032 Chronic diastolic (congestive) heart failure: Secondary | ICD-10-CM | POA: Diagnosis not present

## 2014-10-12 DIAGNOSIS — I10 Essential (primary) hypertension: Secondary | ICD-10-CM | POA: Diagnosis not present

## 2014-10-15 DIAGNOSIS — M170 Bilateral primary osteoarthritis of knee: Secondary | ICD-10-CM | POA: Diagnosis not present

## 2014-10-15 DIAGNOSIS — E8881 Metabolic syndrome: Secondary | ICD-10-CM | POA: Diagnosis not present

## 2014-10-15 DIAGNOSIS — I5032 Chronic diastolic (congestive) heart failure: Secondary | ICD-10-CM | POA: Diagnosis not present

## 2014-10-15 DIAGNOSIS — K219 Gastro-esophageal reflux disease without esophagitis: Secondary | ICD-10-CM | POA: Diagnosis not present

## 2014-10-15 DIAGNOSIS — I2510 Atherosclerotic heart disease of native coronary artery without angina pectoris: Secondary | ICD-10-CM | POA: Diagnosis not present

## 2014-10-15 DIAGNOSIS — I10 Essential (primary) hypertension: Secondary | ICD-10-CM | POA: Diagnosis not present

## 2014-10-15 DIAGNOSIS — G629 Polyneuropathy, unspecified: Secondary | ICD-10-CM | POA: Diagnosis not present

## 2014-10-15 DIAGNOSIS — E119 Type 2 diabetes mellitus without complications: Secondary | ICD-10-CM | POA: Diagnosis not present

## 2014-10-17 DIAGNOSIS — E119 Type 2 diabetes mellitus without complications: Secondary | ICD-10-CM | POA: Diagnosis not present

## 2014-10-17 DIAGNOSIS — I2510 Atherosclerotic heart disease of native coronary artery without angina pectoris: Secondary | ICD-10-CM | POA: Diagnosis not present

## 2014-10-17 DIAGNOSIS — G629 Polyneuropathy, unspecified: Secondary | ICD-10-CM | POA: Diagnosis not present

## 2014-10-17 DIAGNOSIS — E8881 Metabolic syndrome: Secondary | ICD-10-CM | POA: Diagnosis not present

## 2014-10-17 DIAGNOSIS — I10 Essential (primary) hypertension: Secondary | ICD-10-CM | POA: Diagnosis not present

## 2014-10-17 DIAGNOSIS — I5032 Chronic diastolic (congestive) heart failure: Secondary | ICD-10-CM | POA: Diagnosis not present

## 2014-10-17 DIAGNOSIS — K219 Gastro-esophageal reflux disease without esophagitis: Secondary | ICD-10-CM | POA: Diagnosis not present

## 2014-10-17 DIAGNOSIS — M170 Bilateral primary osteoarthritis of knee: Secondary | ICD-10-CM | POA: Diagnosis not present

## 2014-10-19 ENCOUNTER — Ambulatory Visit: Payer: Medicare PPO | Admitting: Internal Medicine

## 2014-10-19 DIAGNOSIS — I2510 Atherosclerotic heart disease of native coronary artery without angina pectoris: Secondary | ICD-10-CM | POA: Diagnosis not present

## 2014-10-19 DIAGNOSIS — G629 Polyneuropathy, unspecified: Secondary | ICD-10-CM | POA: Diagnosis not present

## 2014-10-19 DIAGNOSIS — M170 Bilateral primary osteoarthritis of knee: Secondary | ICD-10-CM | POA: Diagnosis not present

## 2014-10-19 DIAGNOSIS — K219 Gastro-esophageal reflux disease without esophagitis: Secondary | ICD-10-CM | POA: Diagnosis not present

## 2014-10-19 DIAGNOSIS — I10 Essential (primary) hypertension: Secondary | ICD-10-CM | POA: Diagnosis not present

## 2014-10-19 DIAGNOSIS — E8881 Metabolic syndrome: Secondary | ICD-10-CM | POA: Diagnosis not present

## 2014-10-19 DIAGNOSIS — I5032 Chronic diastolic (congestive) heart failure: Secondary | ICD-10-CM | POA: Diagnosis not present

## 2014-10-19 DIAGNOSIS — E119 Type 2 diabetes mellitus without complications: Secondary | ICD-10-CM | POA: Diagnosis not present

## 2014-10-20 DIAGNOSIS — I509 Heart failure, unspecified: Secondary | ICD-10-CM | POA: Diagnosis not present

## 2014-10-20 DIAGNOSIS — M179 Osteoarthritis of knee, unspecified: Secondary | ICD-10-CM | POA: Diagnosis not present

## 2014-10-20 DIAGNOSIS — G459 Transient cerebral ischemic attack, unspecified: Secondary | ICD-10-CM | POA: Diagnosis not present

## 2014-10-20 DIAGNOSIS — E538 Deficiency of other specified B group vitamins: Secondary | ICD-10-CM | POA: Diagnosis not present

## 2014-10-20 DIAGNOSIS — R609 Edema, unspecified: Secondary | ICD-10-CM | POA: Diagnosis not present

## 2014-10-22 DIAGNOSIS — E8881 Metabolic syndrome: Secondary | ICD-10-CM | POA: Diagnosis not present

## 2014-10-22 DIAGNOSIS — I5032 Chronic diastolic (congestive) heart failure: Secondary | ICD-10-CM | POA: Diagnosis not present

## 2014-10-22 DIAGNOSIS — E119 Type 2 diabetes mellitus without complications: Secondary | ICD-10-CM | POA: Diagnosis not present

## 2014-10-22 DIAGNOSIS — I2510 Atherosclerotic heart disease of native coronary artery without angina pectoris: Secondary | ICD-10-CM | POA: Diagnosis not present

## 2014-10-22 DIAGNOSIS — K219 Gastro-esophageal reflux disease without esophagitis: Secondary | ICD-10-CM | POA: Diagnosis not present

## 2014-10-22 DIAGNOSIS — M170 Bilateral primary osteoarthritis of knee: Secondary | ICD-10-CM | POA: Diagnosis not present

## 2014-10-22 DIAGNOSIS — I10 Essential (primary) hypertension: Secondary | ICD-10-CM | POA: Diagnosis not present

## 2014-10-22 DIAGNOSIS — G629 Polyneuropathy, unspecified: Secondary | ICD-10-CM | POA: Diagnosis not present

## 2014-10-23 DIAGNOSIS — I2510 Atherosclerotic heart disease of native coronary artery without angina pectoris: Secondary | ICD-10-CM | POA: Diagnosis not present

## 2014-10-23 DIAGNOSIS — E8881 Metabolic syndrome: Secondary | ICD-10-CM | POA: Diagnosis not present

## 2014-10-23 DIAGNOSIS — I10 Essential (primary) hypertension: Secondary | ICD-10-CM | POA: Diagnosis not present

## 2014-10-23 DIAGNOSIS — E119 Type 2 diabetes mellitus without complications: Secondary | ICD-10-CM | POA: Diagnosis not present

## 2014-10-23 DIAGNOSIS — I5032 Chronic diastolic (congestive) heart failure: Secondary | ICD-10-CM | POA: Diagnosis not present

## 2014-10-23 DIAGNOSIS — G629 Polyneuropathy, unspecified: Secondary | ICD-10-CM | POA: Diagnosis not present

## 2014-10-23 DIAGNOSIS — M170 Bilateral primary osteoarthritis of knee: Secondary | ICD-10-CM | POA: Diagnosis not present

## 2014-10-23 DIAGNOSIS — K219 Gastro-esophageal reflux disease without esophagitis: Secondary | ICD-10-CM | POA: Diagnosis not present

## 2014-10-24 DIAGNOSIS — I2510 Atherosclerotic heart disease of native coronary artery without angina pectoris: Secondary | ICD-10-CM | POA: Diagnosis not present

## 2014-10-24 DIAGNOSIS — I10 Essential (primary) hypertension: Secondary | ICD-10-CM | POA: Diagnosis not present

## 2014-10-24 DIAGNOSIS — E8881 Metabolic syndrome: Secondary | ICD-10-CM | POA: Diagnosis not present

## 2014-10-24 DIAGNOSIS — I5032 Chronic diastolic (congestive) heart failure: Secondary | ICD-10-CM | POA: Diagnosis not present

## 2014-10-24 DIAGNOSIS — K219 Gastro-esophageal reflux disease without esophagitis: Secondary | ICD-10-CM | POA: Diagnosis not present

## 2014-10-24 DIAGNOSIS — M170 Bilateral primary osteoarthritis of knee: Secondary | ICD-10-CM | POA: Diagnosis not present

## 2014-10-24 DIAGNOSIS — G629 Polyneuropathy, unspecified: Secondary | ICD-10-CM | POA: Diagnosis not present

## 2014-10-24 DIAGNOSIS — E119 Type 2 diabetes mellitus without complications: Secondary | ICD-10-CM | POA: Diagnosis not present

## 2014-10-26 ENCOUNTER — Telehealth: Payer: Self-pay

## 2014-10-26 NOTE — Telephone Encounter (Signed)
PA started via cover my meds.  

## 2014-10-29 ENCOUNTER — Ambulatory Visit: Payer: Medicare PPO

## 2014-10-29 ENCOUNTER — Ambulatory Visit (INDEPENDENT_AMBULATORY_CARE_PROVIDER_SITE_OTHER): Payer: Medicare PPO | Admitting: Internal Medicine

## 2014-10-29 ENCOUNTER — Encounter: Payer: Self-pay | Admitting: Internal Medicine

## 2014-10-29 VITALS — BP 140/84 | HR 71 | Temp 98.8°F | Resp 18 | Ht 64.0 in | Wt 391.0 lb

## 2014-10-29 DIAGNOSIS — L850 Acquired ichthyosis: Secondary | ICD-10-CM | POA: Diagnosis not present

## 2014-10-29 DIAGNOSIS — R262 Difficulty in walking, not elsewhere classified: Secondary | ICD-10-CM | POA: Diagnosis not present

## 2014-10-29 DIAGNOSIS — Z23 Encounter for immunization: Secondary | ICD-10-CM | POA: Diagnosis not present

## 2014-10-29 DIAGNOSIS — J209 Acute bronchitis, unspecified: Secondary | ICD-10-CM

## 2014-10-29 DIAGNOSIS — R195 Other fecal abnormalities: Secondary | ICD-10-CM | POA: Diagnosis not present

## 2014-10-29 DIAGNOSIS — L853 Xerosis cutis: Secondary | ICD-10-CM

## 2014-10-29 DIAGNOSIS — L602 Onychogryphosis: Secondary | ICD-10-CM | POA: Diagnosis not present

## 2014-10-29 DIAGNOSIS — I890 Lymphedema, not elsewhere classified: Secondary | ICD-10-CM | POA: Diagnosis not present

## 2014-10-29 DIAGNOSIS — E119 Type 2 diabetes mellitus without complications: Secondary | ICD-10-CM | POA: Diagnosis not present

## 2014-10-29 MED ORDER — BENZONATATE 200 MG PO CAPS: 200.0000 mg | ORAL_CAPSULE | Freq: Three times a day (TID) | ORAL | Status: DC | PRN

## 2014-10-29 MED ORDER — DOXYCYCLINE HYCLATE 100 MG PO TABS: 100.0000 mg | ORAL_TABLET | Freq: Two times a day (BID) | ORAL | Status: DC

## 2014-10-29 NOTE — Progress Notes (Signed)
   Subjective:    Patient ID: April [name], female    DOB: [birth year], 64 y.o.   MRN: [id number]  HPI She was seen in the emergency room for laceration which was sutured 10/09/14; no antibiotic were prescribed. No imaging or blood work was performed.  She continues to have pain up to level VIII on a 10 scale over the left lower leg. There's been no localized cellulitis or abscess formation. She has no history of MRSA.  Her major concerns today are symptoms of respiratory tract infection. Her son was diagnosed with bronchitis last week.  Cough began last week with production of yellow-brown sputum. She has dyspnea at rest. She also has associated throat pain and bilateral ear pain. Symptoms include hoarseness, watery eyes and chest comfort with cough. She has nasal discharge which is yellow/brown. The volume of secretions from the chest is greater than from the head. She gets hot and cold and describes chills.  Review of Systems  With this she's had some loss of appetite. She had "diarrhea" 3 times yesterday. On further questioning this is actually loose stool rather than watery stool.  She questions whether the dyslipidemia is causing her edema and questions how long she'll need to be on diabetics. She is on simvastatin 40 mg daily. Her last lipids were 12/19/13. At that time LDL was 96; HDL 38.3; and triglycerides 56.    Objective:   Physical Exam Pertinent or positive findings include:BMI 67.08 (Bariatric Surgery has been discussed multiple times).She can not get on exam table.Employing rolling walker. She has mild erythema of the nasal mucosa. She has paroxysmal dry cough with expiratory wheezing. Lymphedema is noted over the lower extremities with associated dryness. The 5.5 cm linear eschar over the left lateral leg is well-healed with no evidence of cellulitis or purulence.  General appearance :adequately nourished; in no distress.  Eyes: No conjunctival inflammation or scleral icterus is  present.  Oral exam:  Lips and gums are healthy appearing.There is no oropharyngeal erythema or exudate noted. Dental hygiene is good.  Heart:  Normal rate and regular rhythm. S1 and S2 normal without gallop, murmur, click, rub or other extra sounds    Lungs:No increased work of breathing.   Abdomen: bowel sounds normal, soft and non-tender without masses, organomegaly or hernias noted.  No guarding or rebound.   Vascular : all pulses equal ; no bruits present.  Skin:Warm & dry.  Intact without suspicious lesions or rashes ; no tenting    Lymphatic: No lymphadenopathy is noted about the head, neck, axilla.   Neuro: Strength, tone decreased     Assessment & Plan:  #1 acute bronchitis  #2 upper respiratory tract infection  #3 stool change  #4 status post laceration; wound is well-healed with no evidence of secondary complication  Plan: See orders and recommendations

## 2014-10-29 NOTE — Patient Instructions (Signed)
The laceration is well-healed; do not apply any ointments or antibiotic to the scar.  Please take a probiotic , Florastor OR Align, every day if the bowels are loose. This will replace the normal bacteria which  are necessary for formation of normal stool and processing of food.  Use Eucerin or Aveeno Daily  Moisturizing Lotion  twice a day  for the dry skin. Bathe with moisturizing liquid soap , not bar soap.

## 2014-10-29 NOTE — Progress Notes (Signed)
Pre visit review using our clinic review tool, if applicable. No additional management support is needed unless otherwise documented below in the visit note. 

## 2014-10-30 ENCOUNTER — Telehealth: Payer: Self-pay | Admitting: Neurology

## 2014-10-30 ENCOUNTER — Telehealth: Payer: Self-pay | Admitting: Internal Medicine

## 2014-10-30 DIAGNOSIS — G629 Polyneuropathy, unspecified: Secondary | ICD-10-CM | POA: Diagnosis not present

## 2014-10-30 DIAGNOSIS — M170 Bilateral primary osteoarthritis of knee: Secondary | ICD-10-CM | POA: Diagnosis not present

## 2014-10-30 DIAGNOSIS — I10 Essential (primary) hypertension: Secondary | ICD-10-CM | POA: Diagnosis not present

## 2014-10-30 DIAGNOSIS — K219 Gastro-esophageal reflux disease without esophagitis: Secondary | ICD-10-CM | POA: Diagnosis not present

## 2014-10-30 DIAGNOSIS — I5032 Chronic diastolic (congestive) heart failure: Secondary | ICD-10-CM | POA: Diagnosis not present

## 2014-10-30 DIAGNOSIS — I2510 Atherosclerotic heart disease of native coronary artery without angina pectoris: Secondary | ICD-10-CM | POA: Diagnosis not present

## 2014-10-30 DIAGNOSIS — E8881 Metabolic syndrome: Secondary | ICD-10-CM | POA: Diagnosis not present

## 2014-10-30 DIAGNOSIS — E119 Type 2 diabetes mellitus without complications: Secondary | ICD-10-CM | POA: Diagnosis not present

## 2014-10-30 NOTE — Telephone Encounter (Signed)
Pt called in and said that she wanted to see   Best number is [phone]

## 2014-10-30 NOTE — Telephone Encounter (Signed)
Called Shanon Brow - he is writing orders for patient's PT to be extended and will fax them over for a signature.

## 2014-10-30 NOTE — Telephone Encounter (Signed)
David with [facility] called requesting continuing PT for 2x wk for 5 wks to address gait, balance, transfers and endurance. Please call and advise. He can be reached at [phone].

## 2014-10-31 NOTE — Telephone Encounter (Signed)
To clarify:  Patient is requesting a letter that it is medically necessary that the podiatrist clip her toenails. There was an issue in routing yesterday. Please call this patient today.

## 2014-11-01 ENCOUNTER — Encounter: Payer: Self-pay | Admitting: Internal Medicine

## 2014-11-01 DIAGNOSIS — K219 Gastro-esophageal reflux disease without esophagitis: Secondary | ICD-10-CM | POA: Diagnosis not present

## 2014-11-01 DIAGNOSIS — E119 Type 2 diabetes mellitus without complications: Secondary | ICD-10-CM | POA: Diagnosis not present

## 2014-11-01 DIAGNOSIS — I10 Essential (primary) hypertension: Secondary | ICD-10-CM | POA: Diagnosis not present

## 2014-11-01 DIAGNOSIS — G629 Polyneuropathy, unspecified: Secondary | ICD-10-CM | POA: Diagnosis not present

## 2014-11-01 DIAGNOSIS — I5032 Chronic diastolic (congestive) heart failure: Secondary | ICD-10-CM | POA: Diagnosis not present

## 2014-11-01 DIAGNOSIS — M170 Bilateral primary osteoarthritis of knee: Secondary | ICD-10-CM | POA: Diagnosis not present

## 2014-11-01 DIAGNOSIS — E8881 Metabolic syndrome: Secondary | ICD-10-CM | POA: Diagnosis not present

## 2014-11-01 DIAGNOSIS — I2510 Atherosclerotic heart disease of native coronary artery without angina pectoris: Secondary | ICD-10-CM | POA: Diagnosis not present

## 2014-11-01 NOTE — Telephone Encounter (Signed)
See letter;please mail to parties as requested

## 2014-11-01 NOTE — Telephone Encounter (Signed)
Please put in a letter that Podiatry nail care is medically indicated because of peripheral neuropathy and significant arthritic joint disease.

## 2014-11-01 NOTE — Telephone Encounter (Signed)
Please advise 

## 2014-11-01 NOTE — Telephone Encounter (Signed)
Patient would like a copy of that letter to the Podiatry office and to her insurance company.

## 2014-11-05 ENCOUNTER — Ambulatory Visit: Payer: Medicare PPO

## 2014-11-05 DIAGNOSIS — I2510 Atherosclerotic heart disease of native coronary artery without angina pectoris: Secondary | ICD-10-CM | POA: Diagnosis not present

## 2014-11-05 DIAGNOSIS — E119 Type 2 diabetes mellitus without complications: Secondary | ICD-10-CM | POA: Diagnosis not present

## 2014-11-05 DIAGNOSIS — M170 Bilateral primary osteoarthritis of knee: Secondary | ICD-10-CM | POA: Diagnosis not present

## 2014-11-05 DIAGNOSIS — I5032 Chronic diastolic (congestive) heart failure: Secondary | ICD-10-CM | POA: Diagnosis not present

## 2014-11-05 DIAGNOSIS — E8881 Metabolic syndrome: Secondary | ICD-10-CM | POA: Diagnosis not present

## 2014-11-05 DIAGNOSIS — K219 Gastro-esophageal reflux disease without esophagitis: Secondary | ICD-10-CM | POA: Diagnosis not present

## 2014-11-05 DIAGNOSIS — I10 Essential (primary) hypertension: Secondary | ICD-10-CM | POA: Diagnosis not present

## 2014-11-05 DIAGNOSIS — G629 Polyneuropathy, unspecified: Secondary | ICD-10-CM | POA: Diagnosis not present

## 2014-11-06 ENCOUNTER — Ambulatory Visit (INDEPENDENT_AMBULATORY_CARE_PROVIDER_SITE_OTHER): Payer: Medicare PPO | Admitting: *Deleted

## 2014-11-06 DIAGNOSIS — E538 Deficiency of other specified B group vitamins: Secondary | ICD-10-CM | POA: Diagnosis not present

## 2014-11-07 DIAGNOSIS — E119 Type 2 diabetes mellitus without complications: Secondary | ICD-10-CM | POA: Diagnosis not present

## 2014-11-07 DIAGNOSIS — E8881 Metabolic syndrome: Secondary | ICD-10-CM | POA: Diagnosis not present

## 2014-11-07 DIAGNOSIS — I10 Essential (primary) hypertension: Secondary | ICD-10-CM | POA: Diagnosis not present

## 2014-11-07 DIAGNOSIS — I2510 Atherosclerotic heart disease of native coronary artery without angina pectoris: Secondary | ICD-10-CM | POA: Diagnosis not present

## 2014-11-07 DIAGNOSIS — G629 Polyneuropathy, unspecified: Secondary | ICD-10-CM | POA: Diagnosis not present

## 2014-11-07 DIAGNOSIS — I5032 Chronic diastolic (congestive) heart failure: Secondary | ICD-10-CM | POA: Diagnosis not present

## 2014-11-07 DIAGNOSIS — M170 Bilateral primary osteoarthritis of knee: Secondary | ICD-10-CM | POA: Diagnosis not present

## 2014-11-07 DIAGNOSIS — K219 Gastro-esophageal reflux disease without esophagitis: Secondary | ICD-10-CM | POA: Diagnosis not present

## 2014-11-08 NOTE — Telephone Encounter (Signed)
Letter regarding Podiatry care has been faxed to [facility] and [facility]. Letter mailed to pt.

## 2014-11-12 DIAGNOSIS — G629 Polyneuropathy, unspecified: Secondary | ICD-10-CM | POA: Diagnosis not present

## 2014-11-12 DIAGNOSIS — I10 Essential (primary) hypertension: Secondary | ICD-10-CM | POA: Diagnosis not present

## 2014-11-12 DIAGNOSIS — I5032 Chronic diastolic (congestive) heart failure: Secondary | ICD-10-CM | POA: Diagnosis not present

## 2014-11-12 DIAGNOSIS — E119 Type 2 diabetes mellitus without complications: Secondary | ICD-10-CM | POA: Diagnosis not present

## 2014-11-12 DIAGNOSIS — K219 Gastro-esophageal reflux disease without esophagitis: Secondary | ICD-10-CM | POA: Diagnosis not present

## 2014-11-12 DIAGNOSIS — E8881 Metabolic syndrome: Secondary | ICD-10-CM | POA: Diagnosis not present

## 2014-11-12 DIAGNOSIS — M170 Bilateral primary osteoarthritis of knee: Secondary | ICD-10-CM | POA: Diagnosis not present

## 2014-11-12 DIAGNOSIS — I2510 Atherosclerotic heart disease of native coronary artery without angina pectoris: Secondary | ICD-10-CM | POA: Diagnosis not present

## 2014-11-14 DIAGNOSIS — E8881 Metabolic syndrome: Secondary | ICD-10-CM | POA: Diagnosis not present

## 2014-11-14 DIAGNOSIS — I2510 Atherosclerotic heart disease of native coronary artery without angina pectoris: Secondary | ICD-10-CM | POA: Diagnosis not present

## 2014-11-14 DIAGNOSIS — I10 Essential (primary) hypertension: Secondary | ICD-10-CM | POA: Diagnosis not present

## 2014-11-14 DIAGNOSIS — K219 Gastro-esophageal reflux disease without esophagitis: Secondary | ICD-10-CM | POA: Diagnosis not present

## 2014-11-14 DIAGNOSIS — G629 Polyneuropathy, unspecified: Secondary | ICD-10-CM | POA: Diagnosis not present

## 2014-11-14 DIAGNOSIS — I5032 Chronic diastolic (congestive) heart failure: Secondary | ICD-10-CM | POA: Diagnosis not present

## 2014-11-14 DIAGNOSIS — E119 Type 2 diabetes mellitus without complications: Secondary | ICD-10-CM | POA: Diagnosis not present

## 2014-11-14 DIAGNOSIS — M170 Bilateral primary osteoarthritis of knee: Secondary | ICD-10-CM | POA: Diagnosis not present

## 2014-11-16 DIAGNOSIS — G459 Transient cerebral ischemic attack, unspecified: Secondary | ICD-10-CM | POA: Diagnosis not present

## 2014-11-16 DIAGNOSIS — R609 Edema, unspecified: Secondary | ICD-10-CM | POA: Diagnosis not present

## 2014-11-16 DIAGNOSIS — M179 Osteoarthritis of knee, unspecified: Secondary | ICD-10-CM | POA: Diagnosis not present

## 2014-11-16 DIAGNOSIS — I509 Heart failure, unspecified: Secondary | ICD-10-CM | POA: Diagnosis not present

## 2014-11-16 DIAGNOSIS — E538 Deficiency of other specified B group vitamins: Secondary | ICD-10-CM | POA: Diagnosis not present

## 2014-11-19 DIAGNOSIS — G629 Polyneuropathy, unspecified: Secondary | ICD-10-CM | POA: Diagnosis not present

## 2014-11-19 DIAGNOSIS — K219 Gastro-esophageal reflux disease without esophagitis: Secondary | ICD-10-CM | POA: Diagnosis not present

## 2014-11-19 DIAGNOSIS — G459 Transient cerebral ischemic attack, unspecified: Secondary | ICD-10-CM | POA: Diagnosis not present

## 2014-11-19 DIAGNOSIS — E119 Type 2 diabetes mellitus without complications: Secondary | ICD-10-CM | POA: Diagnosis not present

## 2014-11-19 DIAGNOSIS — I5032 Chronic diastolic (congestive) heart failure: Secondary | ICD-10-CM | POA: Diagnosis not present

## 2014-11-19 DIAGNOSIS — M170 Bilateral primary osteoarthritis of knee: Secondary | ICD-10-CM | POA: Diagnosis not present

## 2014-11-19 DIAGNOSIS — E538 Deficiency of other specified B group vitamins: Secondary | ICD-10-CM | POA: Diagnosis not present

## 2014-11-19 DIAGNOSIS — R609 Edema, unspecified: Secondary | ICD-10-CM | POA: Diagnosis not present

## 2014-11-19 DIAGNOSIS — I509 Heart failure, unspecified: Secondary | ICD-10-CM | POA: Diagnosis not present

## 2014-11-19 DIAGNOSIS — I2510 Atherosclerotic heart disease of native coronary artery without angina pectoris: Secondary | ICD-10-CM | POA: Diagnosis not present

## 2014-11-19 DIAGNOSIS — E8881 Metabolic syndrome: Secondary | ICD-10-CM | POA: Diagnosis not present

## 2014-11-19 DIAGNOSIS — M179 Osteoarthritis of knee, unspecified: Secondary | ICD-10-CM | POA: Diagnosis not present

## 2014-11-19 DIAGNOSIS — I10 Essential (primary) hypertension: Secondary | ICD-10-CM | POA: Diagnosis not present

## 2014-11-21 DIAGNOSIS — I2510 Atherosclerotic heart disease of native coronary artery without angina pectoris: Secondary | ICD-10-CM | POA: Diagnosis not present

## 2014-11-21 DIAGNOSIS — M170 Bilateral primary osteoarthritis of knee: Secondary | ICD-10-CM | POA: Diagnosis not present

## 2014-11-21 DIAGNOSIS — E8881 Metabolic syndrome: Secondary | ICD-10-CM | POA: Diagnosis not present

## 2014-11-21 DIAGNOSIS — E119 Type 2 diabetes mellitus without complications: Secondary | ICD-10-CM | POA: Diagnosis not present

## 2014-11-21 DIAGNOSIS — G629 Polyneuropathy, unspecified: Secondary | ICD-10-CM | POA: Diagnosis not present

## 2014-11-21 DIAGNOSIS — I10 Essential (primary) hypertension: Secondary | ICD-10-CM | POA: Diagnosis not present

## 2014-11-21 DIAGNOSIS — K219 Gastro-esophageal reflux disease without esophagitis: Secondary | ICD-10-CM | POA: Diagnosis not present

## 2014-11-21 DIAGNOSIS — I5032 Chronic diastolic (congestive) heart failure: Secondary | ICD-10-CM | POA: Diagnosis not present

## 2014-11-22 ENCOUNTER — Telehealth: Payer: Self-pay | Admitting: Emergency Medicine

## 2014-11-22 NOTE — Telephone Encounter (Signed)
Spoke with receptionist at [facility], after speaking with the pt. Receptionist stated that the insurance company has covered the part of the visit that they will cover. Pt stated that another letter needed to be sent to the insurance company. I verified this with receptionist and she stated that it is taken care of and they will call if another letter is needed.

## 2014-11-26 DIAGNOSIS — I2510 Atherosclerotic heart disease of native coronary artery without angina pectoris: Secondary | ICD-10-CM | POA: Diagnosis not present

## 2014-11-26 DIAGNOSIS — I10 Essential (primary) hypertension: Secondary | ICD-10-CM | POA: Diagnosis not present

## 2014-11-26 DIAGNOSIS — E8881 Metabolic syndrome: Secondary | ICD-10-CM | POA: Diagnosis not present

## 2014-11-26 DIAGNOSIS — G629 Polyneuropathy, unspecified: Secondary | ICD-10-CM | POA: Diagnosis not present

## 2014-11-26 DIAGNOSIS — E119 Type 2 diabetes mellitus without complications: Secondary | ICD-10-CM | POA: Diagnosis not present

## 2014-11-26 DIAGNOSIS — M170 Bilateral primary osteoarthritis of knee: Secondary | ICD-10-CM | POA: Diagnosis not present

## 2014-11-26 DIAGNOSIS — K219 Gastro-esophageal reflux disease without esophagitis: Secondary | ICD-10-CM | POA: Diagnosis not present

## 2014-11-26 DIAGNOSIS — I5032 Chronic diastolic (congestive) heart failure: Secondary | ICD-10-CM | POA: Diagnosis not present

## 2014-11-28 DIAGNOSIS — I2510 Atherosclerotic heart disease of native coronary artery without angina pectoris: Secondary | ICD-10-CM | POA: Diagnosis not present

## 2014-11-28 DIAGNOSIS — I5032 Chronic diastolic (congestive) heart failure: Secondary | ICD-10-CM | POA: Diagnosis not present

## 2014-11-28 DIAGNOSIS — K219 Gastro-esophageal reflux disease without esophagitis: Secondary | ICD-10-CM | POA: Diagnosis not present

## 2014-11-28 DIAGNOSIS — I10 Essential (primary) hypertension: Secondary | ICD-10-CM | POA: Diagnosis not present

## 2014-11-28 DIAGNOSIS — M170 Bilateral primary osteoarthritis of knee: Secondary | ICD-10-CM | POA: Diagnosis not present

## 2014-11-28 DIAGNOSIS — E8881 Metabolic syndrome: Secondary | ICD-10-CM | POA: Diagnosis not present

## 2014-11-28 DIAGNOSIS — E119 Type 2 diabetes mellitus without complications: Secondary | ICD-10-CM | POA: Diagnosis not present

## 2014-11-28 DIAGNOSIS — G629 Polyneuropathy, unspecified: Secondary | ICD-10-CM | POA: Diagnosis not present

## 2014-12-04 ENCOUNTER — Ambulatory Visit: Payer: Medicare PPO

## 2014-12-05 ENCOUNTER — Ambulatory Visit (INDEPENDENT_AMBULATORY_CARE_PROVIDER_SITE_OTHER): Payer: Medicare PPO

## 2014-12-05 VITALS — BP 151/73 | HR 68 | Resp 20 | Ht 64.0 in | Wt 398.0 lb

## 2014-12-05 DIAGNOSIS — E538 Deficiency of other specified B group vitamins: Secondary | ICD-10-CM

## 2014-12-05 MED ORDER — CYANOCOBALAMIN 1000 MCG/ML IJ SOLN
1000.0000 ug | Freq: Once | INTRAMUSCULAR | Status: AC
  Administered 2014-12-05: 1000 ug via INTRAMUSCULAR

## 2014-12-06 DIAGNOSIS — M1712 Unilateral primary osteoarthritis, left knee: Secondary | ICD-10-CM | POA: Diagnosis not present

## 2014-12-13 DIAGNOSIS — M1711 Unilateral primary osteoarthritis, right knee: Secondary | ICD-10-CM | POA: Diagnosis not present

## 2014-12-17 DIAGNOSIS — I509 Heart failure, unspecified: Secondary | ICD-10-CM | POA: Diagnosis not present

## 2014-12-17 DIAGNOSIS — E538 Deficiency of other specified B group vitamins: Secondary | ICD-10-CM | POA: Diagnosis not present

## 2014-12-17 DIAGNOSIS — M179 Osteoarthritis of knee, unspecified: Secondary | ICD-10-CM | POA: Diagnosis not present

## 2014-12-17 DIAGNOSIS — G459 Transient cerebral ischemic attack, unspecified: Secondary | ICD-10-CM | POA: Diagnosis not present

## 2014-12-17 DIAGNOSIS — R609 Edema, unspecified: Secondary | ICD-10-CM | POA: Diagnosis not present

## 2014-12-20 DIAGNOSIS — I509 Heart failure, unspecified: Secondary | ICD-10-CM | POA: Diagnosis not present

## 2014-12-20 DIAGNOSIS — M179 Osteoarthritis of knee, unspecified: Secondary | ICD-10-CM | POA: Diagnosis not present

## 2014-12-20 DIAGNOSIS — G459 Transient cerebral ischemic attack, unspecified: Secondary | ICD-10-CM | POA: Diagnosis not present

## 2014-12-20 DIAGNOSIS — E538 Deficiency of other specified B group vitamins: Secondary | ICD-10-CM | POA: Diagnosis not present

## 2014-12-20 DIAGNOSIS — R609 Edema, unspecified: Secondary | ICD-10-CM | POA: Diagnosis not present

## 2014-12-21 ENCOUNTER — Encounter: Payer: Self-pay | Admitting: Internal Medicine

## 2014-12-21 ENCOUNTER — Ambulatory Visit (INDEPENDENT_AMBULATORY_CARE_PROVIDER_SITE_OTHER): Payer: Medicare PPO | Admitting: Internal Medicine

## 2014-12-21 ENCOUNTER — Other Ambulatory Visit (INDEPENDENT_AMBULATORY_CARE_PROVIDER_SITE_OTHER): Payer: Medicare PPO

## 2014-12-21 VITALS — BP 160/90 | HR 71 | Temp 98.4°F | Resp 18 | Ht 64.0 in | Wt 387.0 lb

## 2014-12-21 DIAGNOSIS — E785 Hyperlipidemia, unspecified: Secondary | ICD-10-CM

## 2014-12-21 DIAGNOSIS — Z86010 Personal history of colonic polyps: Secondary | ICD-10-CM

## 2014-12-21 DIAGNOSIS — E8881 Metabolic syndrome: Secondary | ICD-10-CM | POA: Diagnosis not present

## 2014-12-21 DIAGNOSIS — I10 Essential (primary) hypertension: Secondary | ICD-10-CM | POA: Diagnosis not present

## 2014-12-21 LAB — BASIC METABOLIC PANEL
BUN: 20 mg/dL (ref 6–23)
CHLORIDE: 109 meq/L (ref 96–112)
CO2: 27 meq/L (ref 19–32)
Calcium: 9.3 mg/dL (ref 8.4–10.5)
Creatinine, Ser: 0.96 mg/dL (ref 0.40–1.20)
GFR: 75.12 mL/min (ref >60.00)
Glucose, Bld: 104 mg/dL — ABNORMAL HIGH (ref 70–99)
POTASSIUM: 3.6 meq/L (ref 3.5–5.1)
SODIUM: 144 meq/L (ref 135–145)

## 2014-12-21 LAB — HEPATIC FUNCTION PANEL
ALBUMIN: 4 g/dL (ref 3.5–5.2)
ALT: 12 U/L (ref 0–35)
AST: 14 U/L (ref 0–37)
Alkaline Phosphatase: 67 U/L (ref 39–117)
BILIRUBIN TOTAL: 0.4 mg/dL (ref 0.2–1.2)
Bilirubin, Direct: 0.1 mg/dL (ref 0.0–0.3)
TOTAL PROTEIN: 7.2 g/dL (ref 6.0–8.3)

## 2014-12-21 LAB — CBC WITH DIFFERENTIAL/PLATELET
Basophils Absolute: 0.1 10*3/uL (ref 0.0–0.1)
Basophils Relative: 0.8 % (ref 0.0–3.0)
EOS PCT: 1.2 % (ref 0.0–5.0)
Eosinophils Absolute: 0.1 10*3/uL (ref 0.0–0.7)
HEMATOCRIT: 39.8 % (ref 36.0–46.0)
HEMOGLOBIN: 12.6 g/dL (ref 12.0–15.0)
LYMPHS PCT: 15.4 % (ref 12.0–46.0)
Lymphs Abs: 1.2 10*3/uL (ref 0.7–4.0)
MCHC: 31.6 g/dL (ref 30.0–36.0)
MCV: 90.1 fl (ref 78.0–100.0)
MONOS PCT: 6.3 % (ref 3.0–12.0)
Monocytes Absolute: 0.5 10*3/uL (ref 0.1–1.0)
Neutro Abs: 5.7 10*3/uL (ref 1.4–7.7)
Neutrophils Relative %: 76.3 % (ref 43.0–77.0)
Platelets: 158 10*3/uL (ref 150.0–400.0)
RBC: 4.41 Mil/uL (ref 3.87–5.11)
RDW: 15.9 % — ABNORMAL HIGH (ref 11.5–15.5)
WBC: 7.5 10*3/uL (ref 4.0–10.5)

## 2014-12-21 LAB — LIPID PANEL
CHOLESTEROL: 158 mg/dL (ref 0–200)
HDL: 37.8 mg/dL — ABNORMAL LOW (ref >39.00)
LDL CALC: 107 mg/dL — AB (ref 0–99)
NonHDL: 120.63
Total CHOL/HDL Ratio: 4
Triglycerides: 67 mg/dL (ref 0.0–149.0)
VLDL: 13.4 mg/dL (ref 0.0–40.0)

## 2014-12-21 LAB — TSH: TSH: 3.31 u[IU]/mL (ref 0.35–4.50)

## 2014-12-21 LAB — HEMOGLOBIN A1C: Hgb A1c MFr Bld: 5.5 % (ref 4.6–6.5)

## 2014-12-21 NOTE — Progress Notes (Signed)
   Subjective:    Patient ID: April [name], female    DOB: September 23, 1950, 64 y.o.   MRN: [id number]  HPI The patient is here to assess status of active health conditions.  PMH, FH, & Social History reviewed & updated.No change in Coffee as recorded.  She states that she does restrict salt. She has some fried foods. She eats red meat liberally. She does not exercise.  She does not take the amlodipine unless her systolic blood pressure is over 140. She states her blood pressure at home was 135-140/60-70.  She does have occasional palpitations. She has chronic exertional dyspnea.   Review of Systems She  describes her nails as being thin and breaking easily. She has cold intolerance.  Chest pain, palpitations, tachycardia, exertional dyspnea, paroxysmal nocturnal dyspnea, claudication or edema are absent. No unexplained weight loss, abdominal pain, significant dyspepsia, dysphagia, melena, rectal bleeding, or persistently small caliber stools. Dysuria, pyuria, hematuria, frequency, nocturia or polyuria are denied. Change in hair or skin denied. No bowel changes of constipation or diarrhea. No intolerance to heat.      Objective:   Physical Exam  Pertinent or positive findings include: BMI is 66.4. Breath sounds are decreased. Abdomen is massive. She has edema and lipedema of the lower extremities. There is hyperpigmented, thickened skin suggesting stasis changes over the lower legs. Pedal pulses are decreased.  General appearance :adequately nourished; in no distress.  Eyes: No conjunctival inflammation or scleral icterus is present.  Oral exam:  Lips and gums are healthy appearing.There is no oropharyngeal erythema or exudate noted. Dental hygiene is good.  Heart:  Normal rate and regular rhythm. S1 and S2 normal without gallop, murmur, click, rub or other extra sounds    Lungs:Chest clear to auscultation; no wheezes, rhonchi,rales ,or rubs present.No increased work of breathing.    Abdomen: bowel sounds normal, soft and non-tender without masses, organomegaly or hernias noted.  No guarding or rebound.   Vascular : all pulses equal ; no bruits present.  Skin:Warm & dry.  Intact without suspicious lesions or rashes ; no tenting.  Lymphatic: No lymphadenopathy is noted about the head, neck, axilla.   Neuro: Strength, tone decreased.     Assessment & Plan:  See Current Assessment & Plan in Problem List under specific Diagnosis

## 2014-12-21 NOTE — Patient Instructions (Signed)
Your ideal BP goal = AVERAGE < 135/85.Minimal goal is average < 140/90. Avoid ingestion of  excess salt/sodium.Cook with pepper & other spices . Use the salt substitute "No Salt" OR the Mrs Deliah Boston products to season food @ the table. Avoid foods which taste salty or "vinegary" as their sodium content will be high.   Your next office appointment will be determined based upon review of your pending labs . Those written interpretation of the lab results and instructions will be transmitted to you by mail for your records.  Critical results will be called.   Followup as needed for any active or acute issue. Please report any significant change in your symptoms.

## 2014-12-21 NOTE — Assessment & Plan Note (Signed)
CBC

## 2014-12-21 NOTE — Assessment & Plan Note (Signed)
Blood pressure goals reviewed. BMET Start Amlodipine

## 2014-12-21 NOTE — Progress Notes (Signed)
Pre visit review using our clinic review tool, if applicable. No additional management support is needed unless otherwise documented below in the visit note. 

## 2014-12-21 NOTE — Assessment & Plan Note (Signed)
A1c

## 2014-12-21 NOTE — Assessment & Plan Note (Signed)
Lipids, LFTs, TSH  

## 2015-01-03 ENCOUNTER — Ambulatory Visit: Payer: Medicare PPO | Admitting: Nurse Practitioner

## 2015-01-03 ENCOUNTER — Ambulatory Visit (INDEPENDENT_AMBULATORY_CARE_PROVIDER_SITE_OTHER): Payer: Medicare PPO | Admitting: *Deleted

## 2015-01-03 ENCOUNTER — Ambulatory Visit (INDEPENDENT_AMBULATORY_CARE_PROVIDER_SITE_OTHER): Payer: Medicare PPO | Admitting: Nurse Practitioner

## 2015-01-03 ENCOUNTER — Encounter: Payer: Self-pay | Admitting: Nurse Practitioner

## 2015-01-03 ENCOUNTER — Ambulatory Visit: Payer: Medicare PPO

## 2015-01-03 VITALS — BP 175/90 | HR 77 | Ht 64.0 in | Wt 382.4 lb

## 2015-01-03 DIAGNOSIS — E538 Deficiency of other specified B group vitamins: Secondary | ICD-10-CM

## 2015-01-03 DIAGNOSIS — G609 Hereditary and idiopathic neuropathy, unspecified: Secondary | ICD-10-CM

## 2015-01-03 DIAGNOSIS — R269 Unspecified abnormalities of gait and mobility: Secondary | ICD-10-CM

## 2015-01-03 MED ORDER — GABAPENTIN 300 MG PO CAPS: 300.0000 mg | ORAL_CAPSULE | Freq: Three times a day (TID) | ORAL | Status: DC

## 2015-01-03 MED ORDER — DULOXETINE HCL 60 MG PO CPEP: 60.0000 mg | ORAL_CAPSULE | Freq: Every day | ORAL | Status: DC

## 2015-01-03 NOTE — Progress Notes (Signed)
Pt here for B 12 injection.  Under aseptic technique cyanocobalamin 1000mcg/1ml IM given R deltoid.  Tolerated well.  Bandaid applied.  

## 2015-01-03 NOTE — Patient Instructions (Signed)
See next month. 

## 2015-01-03 NOTE — Progress Notes (Signed)
[facility]  PATIENT: April [name] DOB: Mar 05, 1950   REASON FOR VISIT: follow up for B12 deficiency,peripheral neuropathy, abnormality of gait HISTORY FROM:patient    HISTORY OF PRESENT ILLNESS:HISTORY ( Initial evaluation 10/25/2012): evaluation of bilateral muscle cramping.  She had past medical history of obesity, diabetes, hyperlipidemia, hypertension, presenting with bilateral feet paresthesia muscle cramping. saw her previously for diabetic peripheral neuropathy, she complained of bilateral knee pain, bilateral feet paresthesia, difficulty bearing weight, gait difficulty, EMG nerve conduction study in January 2013 confirmed the diagnosis of axonal peripheral neuropathy,   She has been taking Topamax 100 mg twice a day for bilateral feet paresthesia, previous laboratory evaluation demonstrated normal sodium, potassium, magnesium, CPK of 92.  She continued to suffer severe morbid obesity, continued to have low back pain, gait difficulty, bilateral knee pain, recent in one year, she also began to suffer bilateral feet muscle cramping, intermittent, severe, lasting 7-8 minutes, happen about once a week. It often involves her feet muscles, also involves her thigh muscles.  She continued to have significant obesity, gait difficulty.  Lab showed low B12 183 October 2014, received B12 shot, now monthly, repeat B12 level January 2015 was normal 622  She came in with wheelchair, last visit was with Hoyle Sauer in January 2015, she recently developed acid reflux, now on prilosec, mild difficulty swallowing. She still has feet swelling, bilateral feet numbness, burning sensation, frequent bilateral feet, and leg muscle crampiness, couple times a week, woke her up from sleep, very painful, she is taking Topamax 100 mg twice a day, which seems to help her some,  She has become more sedentary cause of her weight, knee pain, sitting in wheelchair most of the time  UPDATE August  10th 2016: April [name] is currently receiving home physical therapy, who has suggested hospital bed and gel overlay, lower air loss mattress, new walker, overhead trapezius bar, because of her limited mobility,obesity, orthostatic dizziness, multiple joints pain, especially right knee pain, bilateral feet paresthesia, needle prick pain. She also developed bilateral posterior thigh area skin abrasion, pressor ulcers. UPDATE12/01/2016Ms. [name], 64 year old female returns for follow-up. She has a history of B12 deficiency gait abnormality, peripheral neuropathy. She is currently taking Cymbalta and Topamax but her gabapentin she is only taking when necessary not as ordered. She gets her B12 injections in our office. She received some home physical therapy and claims she is doing a home exercise program now.Since last seen by Dr. [name] she has received a hospital bed with trapeze and her bed mobility is much better. She also has a walker. She has not fallen. She returns for reevaluation  REVIEW OF SYSTEMS: Full 14 system review of systems performed and notable only for those listed, all others are neg:  Constitutional: fatigue  Cardiovascular: neg Ear/Nose/Throat: neg  Skin: neg Eyes: neg Respiratory: shortness of breath Gastroitestinal: neg  Hematology/Lymphatic: neg  Endocrine: neg Musculoskeletal:joint pain walking difficulty Allergy/Immunology: neg Neurological: headache, weakness, numbness Psychiatric: neg Sleep : neg   ALLERGIES: Allergies  Allergen Reactions  . Penicillins     REACTION: rash  . Benazepril Hcl Other (See Comments)    Unknown allergy  . Hydrochlorothiazide W-Triamterene     REACTION: hypokalemia  . Metronidazole Other (See Comments)    Unknown allergy  . Spironolactone     REACTION: kidney problems  . Sulfonamide Derivatives     REACTION: internal "burning"  . Torsemide Other (See Comments)    Unknown allergy  . Valsartan Other (See Comments)    Unknown  allergy      HOME MEDICATIONS: Outpatient Prescriptions Prior to Visit  Medication Sig Dispense Refill  . albuterol (PROVENTIL HFA) 108 (90 BASE) MCG/ACT inhaler Inhale 2 puffs into the lungs every 4 (four) hours as needed for wheezing or shortness of breath. 1 Inhaler 4  . amLODipine (NORVASC) 5 MG tablet Take 1 tablet (5 mg total) by mouth daily. If BP not < 140/90 with increased BP meds 90 tablet 1  . aspirin (ECOTRIN LOW STRENGTH) 81 MG EC tablet Take 81 mg by mouth daily.      . budesonide-formoterol (SYMBICORT) 160-4.5 MCG/ACT inhaler one - two inhalations every 12 hours; gargle and spit after use 1 Inhaler 4  . carvedilol (COREG) 25 MG tablet TAKE 1 TABLET BY MOUTH EVERY MORNING AND ONE-HALF TAB EVERY EVENING 135 tablet 2  . Cod Liver Oil CAPS Take 2 capsules by mouth daily.    . DULoxetine (CYMBALTA) 60 MG capsule Take 1 capsule (60 mg total) by mouth daily. 30 capsule 12  . Flaxseed, Linseed, (FLAX SEED OIL) 1000 MG CAPS Take by mouth 3 (three) times daily.      . folic acid (FOLVITE) Q000111Q MCG tablet Take 800 mcg by mouth daily.    . furosemide (LASIX) 40 MG tablet Take 1-1.5 Tablets by mouth daily 90 tablet 0  . gabapentin (NEURONTIN) 300 MG capsule Take 1 capsule (300 mg total) by mouth 3 (three) times daily. 90 capsule 6  . glucose blood (ONE TOUCH ULTRA TEST) test strip Use as instructed 100 each 3  . hydrALAZINE (APRESOLINE) 50 MG tablet Take 1 tab by mouth every 8 hrs 270 tablet 1  . Lancets (ONETOUCH ULTRASOFT) lancets Use as instructed 100 each 3  . Multiple Vitamin (MULTIVITAMIN) tablet Take 1 tablet by mouth daily.      . Omega-3 Fatty Acids (FISH OIL) 1000 MG CAPS Take 1 capsule by mouth daily.     Marland Kitchen omeprazole (PRILOSEC) 40 MG capsule Take 1 capsule (40 mg total) by mouth 2 (two) times daily. 60 capsule 6  . simvastatin (ZOCOR) 40 MG tablet Take 1 tablet (40 mg total) by mouth daily. 90 tablet 2  . topiramate (TOPAMAX) 100 MG tablet Take 1 tablet (100 mg total) by mouth 2 (two)  times daily. 60 tablet 11  . verapamil (VERELAN PM) 360 MG 24 hr capsule Take 1 capsule (360 mg total) by mouth daily. 90 capsule 1   Facility-Administered Medications Prior to Visit  Medication Dose Route Frequency Provider Last Rate Last Dose  . cyanocobalamin ((VITAMIN B-12)) injection 1,000 mcg  1,000 mcg Intramuscular Q30 days Marcial Pacas, MD   1,000 mcg at 11/06/14 1542    PAST MEDICAL HISTORY: Past Medical History  Diagnosis Date  . Meralgia paresthetica     Dr. [name]  . CTS (carpal tunnel syndrome)   . CAD (coronary artery disease)   . Osteoarthrosis, unspecified whether generalized or localized, lower leg   . Diabetes mellitus   . Hypertension   . Hyperlipidemia   . Morbid obesity ([place])   . PUD (peptic ulcer disease)   . Unspecified hereditary and idiopathic peripheral neuropathy   . Morbid obesity ([place])   . Type II or unspecified type diabetes mellitus without mention of complication, not stated as uncontrolled   . Carpal tunnel syndrome   . Asthma   . Colon polyp, hyperplastic 2007 & 2012  . Anemia     PAST SURGICAL HISTORY: Past Surgical History  Procedure Laterality Date  .  Abdominal hysterectomy    . Dilation and curettage of uterus      multiple  . Tonsillectomy and adenoidectomy    . Cardiac catheterization  2002    non obstructive disease  . Renal calculi  12/1997    SVT with induction of anesthesia  . Colonoscopy with polypectomy  2007 & 2012     hyperplastic ;Dr Watt Climes    FAMILY HISTORY: Family History  Problem Relation Age of Onset  . Colon cancer Mother   . Prostate cancer Father   . Colon cancer Father   . Diabetes Maternal Aunt   . Diabetes Maternal Uncle   . Diabetes Paternal Aunt   . Stroke Paternal Aunt     > 65  . Heart disease Paternal Aunt   . Diabetes Paternal Uncle   . Breast cancer Maternal Aunt      X 2    SOCIAL HISTORY: Social History   Social History  . Marital Status: Married    Spouse Name: [name]  . Number of  Children: 1  . Years of Education: BS   Occupational History  . Disabled    Social History Main Topics  . Smoking status: Never Smoker   . Smokeless tobacco: Never Used  . Alcohol Use: No  . Drug Use: No  . Sexual Activity: Not on file   Other Topics Concern  . Not on file   Social History Narrative   Patient lives at home with her husband [name] Patient is retired and has a Conservation officer, nature.    Caffeine - some times.   Right handed.     PHYSICAL EXAM  Filed Vitals:   01/03/15 1425  BP: 175/90  Pulse: 77  Height: 5\' 4"  (1.626 m)  Weight: 382 lb 6.4 oz (173.456 kg)   Body mass index is 65.61 kg/(m^2).  Generalized: Well developed, morbidly obesein no acute distress well-groomed Head: normocephalic and atraumatic,. Oropharynx benign  Neck: Supple, no carotid bruits  Cardiac: Regular rate rhythm, no murmur  Musculoskeletal: No deformity Skin bilateral lower extremity discoloration and thick extremely dry skin  Neurological examination   Mentation: Alert oriented to time, place, history taking. Attention span and concentration appropriate. Recent and remote memory intact.  Follows all commands speech and language fluent.   Cranial nerve II-XII: Fundoscopic exam reveals sharp disc margins.Pupils were equal round reactive to light extraocular movements were full, visual field were full on confrontational test. Facial sensation and strength were normal. hearing was intact to finger rubbing bilaterally. Uvula tongue midline. head turning and shoulder shrug were normal and symmetric.Tongue protrusion into cheek strength was normal. Motor: normal bulk and tone, full strength in the BUE, BLE, fine finger movements normal, no pronator drift.  Sensory: length dependent decreased touch, pinprick to knee level decreased vibratory sense to knees Coordination: finger-nose-finger, intact, unable to do heel-to-shin due to body body habitus. dysmetria Reflexes: hyporeflexic  absent ankle jerks Gait and Station: not ambulated in wheelchair DIAGNOSTIC DATA (LABS, IMAGING, TESTING) - I reviewed patient records, labs, notes, testing and imaging myself where available.  Lab Results  Component Value Date   WBC 7.5 12/21/2014   HGB 12.6 12/21/2014   HCT 39.8 12/21/2014   MCV 90.1 12/21/2014   PLT 158.0 12/21/2014      Component Value Date/Time   NA 144 12/21/2014 1017   NA 145* 10/25/2012 1117   K 3.6 12/21/2014 1017   CL 109 12/21/2014 1017   CO2 27 12/21/2014 1017  GLUCOSE 104* 12/21/2014 1017   GLUCOSE 105* 10/25/2012 1117   BUN 20 12/21/2014 1017   BUN 24 10/25/2012 1117   CREATININE 0.96 12/21/2014 1017   CALCIUM 9.3 12/21/2014 1017   PROT 7.2 12/21/2014 1017   PROT 6.4 10/25/2012 1117   ALBUMIN 4.0 12/21/2014 1017   ALBUMIN 4.1 10/25/2012 1117   AST 14 12/21/2014 1017   ALT 12 12/21/2014 1017   ALKPHOS 67 12/21/2014 1017   BILITOT 0.4 12/21/2014 1017   GFRNONAA 60 10/25/2012 1117   GFRAA 69 10/25/2012 1117   Lab Results  Component Value Date   CHOL 158 12/21/2014   HDL 37.80* 12/21/2014   LDLCALC 107* 12/21/2014   LDLDIRECT 169.4 05/02/2007   TRIG 67.0 12/21/2014   CHOLHDL 4 12/21/2014   Lab Results  Component Value Date   HGBA1C 5.5 12/21/2014   Lab Results  Component Value Date   [id number] 02/24/2013   Lab Results  Component Value Date   TSH 3.31 12/21/2014      ASSESSMENT AND PLAN  64 y.o. year old female  has a past medical history of Meralgia paresthetica; CAD (coronary artery disease); Osteoarthrosis,  Diabetes mellitus; Hypertension; Hyperlipidemia; Morbid obesity ([place]);  Unspecified hereditary and idiopathic peripheral neuropathy; Morbid obesity ([place]); Type II or unspecified type diabetes mellitus without mention of complication, not stated as uncontrolled;B12 deficiency and gait abnormality here to follow-up  Continue Topamax at current dose Continue Gabapentin 3 times a day Continue Cymbalta at current  dose Continue B12 injections received in the office today F/U in 6 months Dennie Bible, [facility], [facility], [facility] [address] [place], [place] [address] [phone]

## 2015-01-03 NOTE — Patient Instructions (Signed)
Continue Topamax at current dose Continue Gabapentin 3 times a day Continue Cymbalta at current dose Continue B12 injections F/U in 6 months

## 2015-01-03 NOTE — Progress Notes (Signed)
I have reviewed and agreed above plan. 

## 2015-01-04 ENCOUNTER — Ambulatory Visit: Payer: Medicare PPO | Admitting: Nurse Practitioner

## 2015-01-04 ENCOUNTER — Ambulatory Visit: Payer: Medicare PPO

## 2015-01-08 ENCOUNTER — Ambulatory Visit: Payer: Medicare PPO | Admitting: Nurse Practitioner

## 2015-01-16 DIAGNOSIS — I509 Heart failure, unspecified: Secondary | ICD-10-CM | POA: Diagnosis not present

## 2015-01-16 DIAGNOSIS — G459 Transient cerebral ischemic attack, unspecified: Secondary | ICD-10-CM | POA: Diagnosis not present

## 2015-01-16 DIAGNOSIS — R609 Edema, unspecified: Secondary | ICD-10-CM | POA: Diagnosis not present

## 2015-01-16 DIAGNOSIS — M179 Osteoarthritis of knee, unspecified: Secondary | ICD-10-CM | POA: Diagnosis not present

## 2015-01-16 DIAGNOSIS — E538 Deficiency of other specified B group vitamins: Secondary | ICD-10-CM | POA: Diagnosis not present

## 2015-01-19 DIAGNOSIS — R609 Edema, unspecified: Secondary | ICD-10-CM | POA: Diagnosis not present

## 2015-01-19 DIAGNOSIS — M179 Osteoarthritis of knee, unspecified: Secondary | ICD-10-CM | POA: Diagnosis not present

## 2015-01-19 DIAGNOSIS — E538 Deficiency of other specified B group vitamins: Secondary | ICD-10-CM | POA: Diagnosis not present

## 2015-01-19 DIAGNOSIS — I509 Heart failure, unspecified: Secondary | ICD-10-CM | POA: Diagnosis not present

## 2015-01-19 DIAGNOSIS — G459 Transient cerebral ischemic attack, unspecified: Secondary | ICD-10-CM | POA: Diagnosis not present

## 2015-01-23 ENCOUNTER — Other Ambulatory Visit: Payer: Self-pay | Admitting: Emergency Medicine

## 2015-01-23 DIAGNOSIS — E785 Hyperlipidemia, unspecified: Secondary | ICD-10-CM

## 2015-01-23 MED ORDER — SIMVASTATIN 40 MG PO TABS: 40.0000 mg | ORAL_TABLET | Freq: Every day | ORAL | Status: DC

## 2015-02-05 ENCOUNTER — Ambulatory Visit (INDEPENDENT_AMBULATORY_CARE_PROVIDER_SITE_OTHER): Payer: Medicare Other | Admitting: *Deleted

## 2015-02-05 DIAGNOSIS — E538 Deficiency of other specified B group vitamins: Secondary | ICD-10-CM | POA: Diagnosis not present

## 2015-02-15 ENCOUNTER — Telehealth: Payer: Self-pay | Admitting: Internal Medicine

## 2015-02-15 ENCOUNTER — Other Ambulatory Visit: Payer: Self-pay

## 2015-02-15 DIAGNOSIS — Z1231 Encounter for screening mammogram for malignant neoplasm of breast: Secondary | ICD-10-CM

## 2015-02-15 NOTE — Telephone Encounter (Signed)
Letter printed.

## 2015-02-15 NOTE — Telephone Encounter (Signed)
Patient is needing a new letter typed up to her new insurance company stating the reasons for her needing her nails cut.  There is a current letter in patients chart with Dr. Darrol Angel signature for previous insurance company.

## 2015-02-15 NOTE — Telephone Encounter (Signed)
Please advise 

## 2015-02-15 NOTE — Telephone Encounter (Signed)
Unable to reach pt. Mailing letter to pt.

## 2015-02-19 ENCOUNTER — Other Ambulatory Visit (INDEPENDENT_AMBULATORY_CARE_PROVIDER_SITE_OTHER): Payer: Medicare Other

## 2015-02-19 ENCOUNTER — Encounter: Payer: Self-pay | Admitting: Internal Medicine

## 2015-02-19 ENCOUNTER — Ambulatory Visit (INDEPENDENT_AMBULATORY_CARE_PROVIDER_SITE_OTHER): Payer: Medicare Other | Admitting: Internal Medicine

## 2015-02-19 VITALS — BP 162/98 | HR 88 | Temp 98.9°F | Resp 22 | Wt 374.0 lb

## 2015-02-19 DIAGNOSIS — J45901 Unspecified asthma with (acute) exacerbation: Secondary | ICD-10-CM

## 2015-02-19 DIAGNOSIS — R350 Frequency of micturition: Secondary | ICD-10-CM

## 2015-02-19 DIAGNOSIS — J209 Acute bronchitis, unspecified: Secondary | ICD-10-CM

## 2015-02-19 LAB — URINALYSIS, ROUTINE W REFLEX MICROSCOPIC
Hgb urine dipstick: NEGATIVE
LEUKOCYTES UA: NEGATIVE
Nitrite: NEGATIVE
PH: 6 (ref 5.0–8.0)
SPECIFIC GRAVITY, URINE: 1.025 (ref 1.000–1.030)
TOTAL PROTEIN, URINE-UPE24: 30 — AB
UROBILINOGEN UA: 2 — AB (ref 0.0–1.0)
Urine Glucose: NEGATIVE

## 2015-02-19 MED ORDER — AZITHROMYCIN 250 MG PO TABS
ORAL_TABLET | ORAL | Status: DC
Start: 2015-02-19 — End: 2015-04-10

## 2015-02-19 MED ORDER — HYDROCOD POLST-CPM POLST ER 10-8 MG/5ML PO SUER: 5.0000 mL | Freq: Two times a day (BID) | ORAL | Status: DC | PRN

## 2015-02-19 MED ORDER — PREDNISONE 10 MG PO TABS: ORAL_TABLET | ORAL | Status: DC

## 2015-02-19 MED ORDER — METHYLPREDNISOLONE ACETATE 40 MG/ML IJ SUSP
40.0000 mg | Freq: Once | INTRAMUSCULAR | Status: AC
  Administered 2015-02-19: 40 mg via INTRAMUSCULAR

## 2015-02-19 MED ORDER — BUDESONIDE-FORMOTEROL FUMARATE 160-4.5 MCG/ACT IN AERO: INHALATION_SPRAY | RESPIRATORY_TRACT | Status: DC

## 2015-02-19 MED ORDER — IPRATROPIUM-ALBUTEROL 0.5-2.5 (3) MG/3ML IN SOLN
3.0000 mL | Freq: Once | RESPIRATORY_TRACT | Status: AC
  Administered 2015-02-19: 3 mL via RESPIRATORY_TRACT

## 2015-02-19 NOTE — Progress Notes (Signed)
Subjective:    Patient ID: April [name], female    DOB: June 02, 1950, 65 y.o.   MRN: [id number]  HPI She is here for an acute visit for cold symptoms. She has asthma and has been experiencing increased asthma symptoms with the cold symptoms.  Her symptoms started last week. She states fevers, chills, decreased appetite, fatigue, sore throat, dry throat, productive cough with discolored phlegm, wheezing, shortness of breath and headaches. She is using her [employer], but it does not seem to be working well. She does not have anymore Symbicort. She has been taking cough medication over-the-counter with minimal improvement of her symptoms.  She also states increased urination recently and is unsure why. She denies any dysuria or hematuria.    Medications and allergies reviewed with patient and updated if appropriate.  Patient Active Problem List   Diagnosis Date Noted  . Abnormality of gait 01/03/2015  . Hereditary and idiopathic peripheral neuropathy 01/03/2015  . GERD (gastroesophageal reflux disease) 06/17/2014  . Morbid obesity with BMI of 50.0-59.9, adult ([place]) 09/05/2013  . Other abnormal glucose 08/05/2013  . Hx of colonic polyps 12/15/2012  . Vitamin B12 deficiency 11/03/2012  . Intrinsic asthma 03/23/2012  . Chronic diastolic heart failure ([place]) 02/20/2011  . Edema 09/17/2010  . OSA (obstructive sleep apnea) 09/17/2010  . URINARY URGENCY 01/08/2010  . CAD, NATIVE VESSEL 11/20/2008  . OSTEOARTHRITIS, KNEES, BILATERAL, SEVERE 06/14/2008  . Metabolic syndrome 123XX123  . Hyperlipidemia 05/10/2007  . Essential hypertension 01/18/2007  . HYPOKALEMIA 04/30/2006  . HX, PERSONAL, PEPTIC ULCER DISEASE 04/30/2006    Current Outpatient Prescriptions on File Prior to Visit  Medication Sig Dispense Refill  . albuterol (PROVENTIL HFA) 108 (90 BASE) MCG/ACT inhaler Inhale 2 puffs into the lungs every 4 (four) hours as needed for wheezing or shortness of breath. 1 Inhaler 4    . amLODipine (NORVASC) 5 MG tablet Take 1 tablet (5 mg total) by mouth daily. If BP not < 140/90 with increased BP meds 90 tablet 1  . aspirin (ECOTRIN LOW STRENGTH) 81 MG EC tablet Take 81 mg by mouth daily.      . budesonide-formoterol (SYMBICORT) 160-4.5 MCG/ACT inhaler one - two inhalations every 12 hours; gargle and spit after use 1 Inhaler 4  . carvedilol (COREG) 25 MG tablet TAKE 1 TABLET BY MOUTH EVERY MORNING AND ONE-HALF TAB EVERY EVENING 135 tablet 2  . Cod Liver Oil CAPS Take 2 capsules by mouth daily.    . DULoxetine (CYMBALTA) 60 MG capsule Take 1 capsule (60 mg total) by mouth daily. 30 capsule 6  . Flaxseed, Linseed, (FLAX SEED OIL) 1000 MG CAPS Take by mouth 3 (three) times daily.      . folic acid (FOLVITE) Q000111Q MCG tablet Take 800 mcg by mouth daily.    . furosemide (LASIX) 40 MG tablet Take 1-1.5 Tablets by mouth daily 90 tablet 0  . gabapentin (NEURONTIN) 300 MG capsule Take 1 capsule (300 mg total) by mouth 3 (three) times daily. 90 capsule 6  . glucose blood (ONE TOUCH ULTRA TEST) test strip Use as instructed 100 each 3  . hydrALAZINE (APRESOLINE) 50 MG tablet Take 1 tab by mouth every 8 hrs 270 tablet 1  . Lancets (ONETOUCH ULTRASOFT) lancets Use as instructed 100 each 3  . Multiple Vitamin (MULTIVITAMIN) tablet Take 1 tablet by mouth daily.      . Omega-3 Fatty Acids (FISH OIL) 1000 MG CAPS Take 1 capsule by mouth daily.     Marland Kitchen  omeprazole (PRILOSEC) 40 MG capsule Take 1 capsule (40 mg total) by mouth 2 (two) times daily. 60 capsule 6  . simvastatin (ZOCOR) 40 MG tablet Take 1 tablet (40 mg total) by mouth daily. --- Please establish with new PCP for further refills 90 tablet 1  . topiramate (TOPAMAX) 100 MG tablet Take 1 tablet (100 mg total) by mouth 2 (two) times daily. 60 tablet 11  . verapamil (VERELAN PM) 360 MG 24 hr capsule Take 1 capsule (360 mg total) by mouth daily. 90 capsule 1   Current Facility-Administered Medications on File Prior to Visit  Medication Dose  Route Frequency Provider Last Rate Last Dose  . cyanocobalamin ((VITAMIN B-12)) injection 1,000 mcg  1,000 mcg Intramuscular Q30 days Marcial Pacas, MD   1,000 mcg at 02/05/15 1842    Past Medical History  Diagnosis Date  . Meralgia paresthetica     Dr. [name]  . CTS (carpal tunnel syndrome)   . CAD (coronary artery disease)   . Osteoarthrosis, unspecified whether generalized or localized, lower leg   . Diabetes mellitus   . Hypertension   . Hyperlipidemia   . Morbid obesity ([place])   . PUD (peptic ulcer disease)   . Unspecified hereditary and idiopathic peripheral neuropathy   . Morbid obesity ([place])   . Type II or unspecified type diabetes mellitus without mention of complication, not stated as uncontrolled   . Carpal tunnel syndrome   . Asthma   . Colon polyp, hyperplastic 2007 & 2012  . Anemia     Past Surgical History  Procedure Laterality Date  . Abdominal hysterectomy    . Dilation and curettage of uterus      multiple  . Tonsillectomy and adenoidectomy    . Cardiac catheterization  2002    non obstructive disease  . Renal calculi  12/1997    SVT with induction of anesthesia  . Colonoscopy with polypectomy  2007 & 2012     hyperplastic ;Dr Watt Climes    Social History   Social History  . Marital Status: Married    Spouse Name: [name]  . Number of Children: 1  . Years of Education: BS   Occupational History  . Disabled    Social History Main Topics  . Smoking status: Never Smoker   . Smokeless tobacco: Never Used  . Alcohol Use: No  . Drug Use: No  . Sexual Activity: Not Asked   Other Topics Concern  . None   Social History Narrative   Patient lives at home with her husband [name] Patient is retired and has a Conservation officer, nature.    Caffeine - some times.   Right handed.    Family History  Problem Relation Age of Onset  . Colon cancer Mother   . Prostate cancer Father   . Colon cancer Father   . Diabetes Maternal Aunt   . Diabetes Maternal Uncle     . Diabetes Paternal Aunt   . Stroke Paternal Aunt     > 65  . Heart disease Paternal Aunt   . Diabetes Paternal Uncle   . Breast cancer Maternal Aunt      X 2    Review of Systems  Constitutional: Positive for fever, chills and appetite change (decreased).  HENT: Positive for congestion (mild), ear pain and sore throat. Negative for sinus pressure.   Respiratory: Positive for cough (productive of discolored phlegm), shortness of breath and wheezing.   Cardiovascular: Positive for chest pain (with cough).  Genitourinary: Positive for frequency. Negative for dysuria and hematuria.  Neurological: Positive for headaches.       Objective:   Filed Vitals:   02/19/15 1543  BP: 162/98  Pulse: 88  Temp: 98.9 F (37.2 C)  Resp: 22   Filed Weights   02/19/15 1543  Weight: 374 lb (169.645 kg)   Body mass index is 64.17 kg/(m^2).   Physical Exam GENERAL APPEARANCE: Appears stated age, well appearing, NAD EYES: conjunctiva clear, no icterus HEENT: bilateral tympanic membranes and ear canals normal, oropharynx with mild erythema, no thyromegaly, trachea midline, no cervical or supraclavicular lymphadenopathy LUNGS: unlabored breathing, fair air entry bilaterally, extensive expiratory wheezing, no obvious crackles HEART: Normal S1,S2 without murmurs EXTREMITIES: Without clubbing, cyanosis, or edema         Assessment & Plan:   Acute bronchitis with asthma exacerbation at least moderate in nature Steroid injection here today-Depo-Medrol 40 mg IM Nebulizer treatment here today Z-Pak sent to pharmacy Tussionex cough syrup Prednisone taper starting tomorrow Continue increased rest and fluids Call if no improvement We will discuss possible nebulizer machine for home at her next appointment-depending on how any asthma exacerbations she has  Increased urinary frequency No other urinary symptoms Will check urinalysis and urine culture No history of diabetes and last A1c was  normal, so I do not think she is having hyperglycemia

## 2015-02-19 NOTE — Progress Notes (Signed)
Pre visit review using our clinic review tool, if applicable. No additional management support is needed unless otherwise documented below in the visit note. 

## 2015-02-19 NOTE — Patient Instructions (Signed)
We will call you with your urine results.  You received a steroid injection today.  Start the oral prednisone tomorrow.  Start the antibiotic today.  Your prescription(s) have been submitted to your pharmacy or been printed and provided for you. Please take as directed and contact our office if you believe you are having problem(s) with the medication(s) or have any questions.  If your symptoms worsen or fail to improve, please contact our office for further instruction, or in case of emergency go directly to the emergency room at the closest medical facility.   General Recommendations:    Please drink plenty of fluids.  Get plenty of rest   Sleep in humidified air  Use saline nasal sprays  Netti pot  OTC Medications:  Decongestants - helps relieve congestion   Flonase (generic fluticasone) or Nasacort (generic triamcinolone) - please make sure to use the "cross-over" technique at a 45 degree angle towards the opposite eye as opposed to straight up the nasal passageway.   Sudafed (generic pseudoephedrine - Note this is the one that is available behind the pharmacy counter); Products with phenylephrine (-PE) may also be used but is often not as effective as pseudoephedrine.   If you have HIGH BLOOD PRESSURE - Coricidin HBP; AVOID any product that is -D as this contains pseudoephedrine which may increase your blood pressure.  Afrin (oxymetazoline) every 6-8 hours for up to 3 days.  Allergies - helps relieve runny nose, itchy eyes and sneezing   Claritin (generic loratidine), Allegra (fexofenidine), or Zyrtec (generic cyrterizine) for runny nose. These medications should not cause drowsiness.  Note - Benadryl (generic diphenhydramine) may be used however may cause drowsiness  Cough -   Delsym or Robitussin (generic dextromethorphan)  Expectorants - helps loosen mucus to ease removal   Mucinex (generic guaifenesin) as directed on the package.  Headaches /  General Aches   Tylenol (generic acetaminophen) - DO NOT EXCEED 3 grams (3,000 mg) in a 24 hour time period  Advil/Motrin (generic ibuprofen)  Sore Throat -   Salt water gargle   Chloraseptic (generic benzocaine) spray or lozenges / Sucrets (generic dyclonine)

## 2015-02-21 LAB — URINE CULTURE
COLONY COUNT: NO GROWTH
ORGANISM ID, BACTERIA: NO GROWTH

## 2015-02-25 ENCOUNTER — Telehealth: Payer: Self-pay | Admitting: Internal Medicine

## 2015-02-25 NOTE — Telephone Encounter (Signed)
Patient is calling to follow up on letter we wrote about her feet. She states that she was under the impression that we were sending this to her insurance to get her clearance for being seen at a podiatrist. i do not see a referral in the system at this time.   Additionally, she is checking to see if we have contacted her insurance on getting her an athsma nebulizer. Advised per the notes that this was to be talked about at the next visit. Please contact the patient to clarify

## 2015-02-25 NOTE — Telephone Encounter (Signed)
I don't know if you meant to route this to me. If symptoms persist or progress ;  Pulmonology referral option as per Dr Quay Burow. Last seen by Dr Gwenette Greet 3/17;he is gone. She tends to need subspecialty involvement based on my past experience (ie, I never seemed to get it right). [employer]

## 2015-02-25 NOTE — Telephone Encounter (Signed)
Please advise. Are we the ones that will be ordering her nebulizer? Or should she be seeing Pulm for this.   I think she is in need of a referral, rather than the letter that she informed up that she needed.

## 2015-02-25 NOTE — Telephone Encounter (Signed)
I am not sure what she needs for the feet - I do not recall talking about this.  Let me know if she needs a referral and if so for what.   Let her know her need for a neb machine needs to be documented so she can either see a lung specialist or we can discuss at our next appt.

## 2015-03-06 ENCOUNTER — Ambulatory Visit (INDEPENDENT_AMBULATORY_CARE_PROVIDER_SITE_OTHER): Payer: Medicare Other | Admitting: *Deleted

## 2015-03-06 DIAGNOSIS — E538 Deficiency of other specified B group vitamins: Secondary | ICD-10-CM | POA: Diagnosis not present

## 2015-03-07 ENCOUNTER — Telehealth: Payer: Self-pay | Admitting: Neurology

## 2015-03-07 DIAGNOSIS — E538 Deficiency of other specified B group vitamins: Secondary | ICD-10-CM | POA: Diagnosis not present

## 2015-03-07 NOTE — Telephone Encounter (Signed)
Patient called, needs information about B12 shot, will she be getting 1 or 2 injections? Names of the injections? States insurance company needs to know.

## 2015-03-07 NOTE — Telephone Encounter (Signed)
Spoke to patient - she is only getting one injection per month.

## 2015-03-15 ENCOUNTER — Ambulatory Visit: Payer: Medicare PPO

## 2015-03-15 ENCOUNTER — Ambulatory Visit
Admission: RE | Admit: 2015-03-15 | Discharge: 2015-03-15 | Disposition: A | Discharge status: Home / Self Care | Payer: Medicare Other | Source: Ambulatory Visit

## 2015-03-15 DIAGNOSIS — Z1231 Encounter for screening mammogram for malignant neoplasm of breast: Secondary | ICD-10-CM

## 2015-03-19 ENCOUNTER — Telehealth: Payer: Self-pay | Admitting: Family

## 2015-03-19 NOTE — Telephone Encounter (Signed)
Please inform patient that her mammogram was normal with no evidence of malignancy. Follow up recommended in 1 year.

## 2015-03-20 ENCOUNTER — Other Ambulatory Visit: Payer: Self-pay

## 2015-03-20 DIAGNOSIS — I10 Essential (primary) hypertension: Secondary | ICD-10-CM

## 2015-03-20 MED ORDER — VERAPAMIL HCL ER 360 MG PO CP24: 360.0000 mg | ORAL_CAPSULE | Freq: Every day | ORAL | Status: DC

## 2015-03-20 NOTE — Telephone Encounter (Signed)
LVM informing pt

## 2015-04-01 ENCOUNTER — Encounter: Payer: Self-pay | Admitting: Emergency Medicine

## 2015-04-01 NOTE — Telephone Encounter (Signed)
Unable to find fax number, mailed to insurance comp

## 2015-04-02 ENCOUNTER — Telehealth: Payer: Self-pay | Admitting: *Deleted

## 2015-04-02 ENCOUNTER — Other Ambulatory Visit: Payer: Self-pay | Admitting: Internal Medicine

## 2015-04-02 DIAGNOSIS — Z800 Family history of malignant neoplasm of digestive organs: Secondary | ICD-10-CM

## 2015-04-02 DIAGNOSIS — Z86010 Personal history of colonic polyps: Secondary | ICD-10-CM

## 2015-04-02 NOTE — Telephone Encounter (Signed)
Per Dr Hilarie Fredrickson, patient is due for recall colonoscopy for personal history of colon polyps and family history of colon cancer. Her BMI is >50 so her procedure must be done at the hospital. Patient has been scheduled at [facility] on 06/04/15 @ 11:15 am for colonoscopy. She is also scheduled for a previsit on 05/23/15. Patient verbalizes understanding of all of the above.

## 2015-04-03 ENCOUNTER — Telehealth: Payer: Self-pay | Admitting: Internal Medicine

## 2015-04-03 ENCOUNTER — Ambulatory Visit (INDEPENDENT_AMBULATORY_CARE_PROVIDER_SITE_OTHER): Payer: Medicare Other | Admitting: *Deleted

## 2015-04-03 VITALS — Wt 359.4 lb

## 2015-04-03 DIAGNOSIS — E538 Deficiency of other specified B group vitamins: Secondary | ICD-10-CM | POA: Diagnosis not present

## 2015-04-03 NOTE — Telephone Encounter (Signed)
Let pt know she is just scheduled for colon and that B12 injection should not be on the same day. Pt states she will reschedule her B12 to another day.

## 2015-04-05 ENCOUNTER — Ambulatory Visit: Payer: Medicare PPO | Admitting: Internal Medicine

## 2015-04-10 ENCOUNTER — Ambulatory Visit (INDEPENDENT_AMBULATORY_CARE_PROVIDER_SITE_OTHER): Payer: Medicare Other | Admitting: Internal Medicine

## 2015-04-10 ENCOUNTER — Other Ambulatory Visit (INDEPENDENT_AMBULATORY_CARE_PROVIDER_SITE_OTHER): Payer: Medicare Other

## 2015-04-10 ENCOUNTER — Encounter: Payer: Self-pay | Admitting: Internal Medicine

## 2015-04-10 VITALS — BP 144/80 | HR 71 | Temp 98.1°F | Resp 16 | Wt 393.0 lb

## 2015-04-10 DIAGNOSIS — L97911 Non-pressure chronic ulcer of unspecified part of right lower leg limited to breakdown of skin: Secondary | ICD-10-CM

## 2015-04-10 DIAGNOSIS — R600 Localized edema: Secondary | ICD-10-CM

## 2015-04-10 DIAGNOSIS — K2100 Gastro-esophageal reflux disease with esophagitis, without bleeding: Secondary | ICD-10-CM

## 2015-04-10 DIAGNOSIS — I10 Essential (primary) hypertension: Secondary | ICD-10-CM

## 2015-04-10 DIAGNOSIS — I2510 Atherosclerotic heart disease of native coronary artery without angina pectoris: Secondary | ICD-10-CM | POA: Diagnosis not present

## 2015-04-10 DIAGNOSIS — E785 Hyperlipidemia, unspecified: Secondary | ICD-10-CM | POA: Diagnosis not present

## 2015-04-10 DIAGNOSIS — G609 Hereditary and idiopathic neuropathy, unspecified: Secondary | ICD-10-CM

## 2015-04-10 LAB — COMPREHENSIVE METABOLIC PANEL
ALT: 9 U/L (ref 0–35)
AST: 12 U/L (ref 0–37)
Albumin: 3.7 g/dL (ref 3.5–5.2)
Alkaline Phosphatase: 60 U/L (ref 39–117)
BUN: 18 mg/dL (ref 6–23)
CHLORIDE: 108 meq/L (ref 96–112)
CO2: 28 meq/L (ref 19–32)
Calcium: 9.1 mg/dL (ref 8.4–10.5)
Creatinine, Ser: 1.01 mg/dL (ref 0.40–1.20)
GFR: 70.78 mL/min (ref >60.00)
GLUCOSE: 89 mg/dL (ref 70–99)
POTASSIUM: 3.3 meq/L — AB (ref 3.5–5.1)
SODIUM: 143 meq/L (ref 135–145)
Total Bilirubin: 0.5 mg/dL (ref 0.2–1.2)
Total Protein: 6.3 g/dL (ref 6.0–8.3)

## 2015-04-10 LAB — MICROALBUMIN / CREATININE URINE RATIO
Creatinine,U: 138.2 mg/dL
Microalb Creat Ratio: 0.7 mg/g (ref 0.0–30.0)
Microalb, Ur: 1 mg/dL (ref 0.0–1.9)

## 2015-04-10 LAB — VITAMIN B12: Vitamin B-12: 1041 pg/mL — ABNORMAL HIGH (ref 211–911)

## 2015-04-10 LAB — HEMOGLOBIN A1C: HEMOGLOBIN A1C: 5.9 % (ref 4.6–6.5)

## 2015-04-10 NOTE — Progress Notes (Signed)
Pre visit review using our clinic review tool, if applicable. No additional management support is needed unless otherwise documented below in the visit note. 

## 2015-04-10 NOTE — Patient Instructions (Signed)
   Test(s) ordered today. Your results will be released to [place] (or called to you) after review, usually within 72hours after test completion. If any changes need to be made, you will be notified at that same time.  An ultrasound of your heart was ordered - we will call you to schedule this.    Medications reviewed and updated.  No changes recommended at this time.    Please followup in 3 months

## 2015-04-10 NOTE — Assessment & Plan Note (Signed)
Worse recently - ? Reason Check cmp May need to change lasix to torsemide Check echo stressed increasing exercise Elevate legs Low sodium diet Stressed decreased portions, weight loss

## 2015-04-10 NOTE — Assessment & Plan Note (Signed)
No chest pain, but relatively inactive Check echo Continue current medications

## 2015-04-10 NOTE — Assessment & Plan Note (Signed)
On simvastatin Lipid panel has been controlled Check cmp

## 2015-04-10 NOTE — Progress Notes (Signed)
Subjective:    Patient ID: April [name], female    DOB: 1950-10-20, 65 y.o.   MRN: [id number]  HPI She is here to establish with a new pcp. She does not feel well.   Leg edema: Her swelling is worse than usual.  She denies change in diet or medications.  Her legs feel heavy and tight.  She had a water blister on the right lower leg that has burst -it is clear fluid.  She eats a low sodium diet.  She takes lasix 60 mg most days, but sometimes only takes 40 mg if she has to go out.    Fatigue:  She has no energy.  She gets B12 injections monthly and it helps, but only temporarily.  She is very sedentary.    CAD, Hypertension: She is taking her medication daily. She is compliant with a low sodium diet.  She denies chest pain, palpitations and regular headaches. She has chronic SOB that is not changed.  She is not exercising regularly.  She does not monitor her blood pressure at home.    GERD:  She is taking her medication daily as prescribed.  She denies any GERD symptoms regularly, but has occasional GERD.     Hyperlipidemia: She is taking her medication daily. She is compliant with a low fat/cholesterol diet. She is not exercising regularly.   Peripheral neuropathy:  She is taking gabapentin three times daily.  She is unsure how much it helps.   B12 def:  She gets B12 injection monthly from neurology.    Medications and allergies reviewed with patient and updated if appropriate.  Patient Active Problem List   Diagnosis Date Noted  . Abnormality of gait 01/03/2015  . Hereditary and idiopathic peripheral neuropathy 01/03/2015  . GERD (gastroesophageal reflux disease) 06/17/2014  . Morbid obesity with BMI of 50.0-59.9, adult ([place]) 09/05/2013  . Other abnormal glucose 08/05/2013  . Hx of colonic polyps 12/15/2012  . Vitamin B12 deficiency 11/03/2012  . Intrinsic asthma 03/23/2012  . Chronic diastolic heart failure ([place]) 02/20/2011  . Edema 09/17/2010  . OSA (obstructive sleep  apnea) 09/17/2010  . URINARY URGENCY 01/08/2010  . CAD, NATIVE VESSEL 11/20/2008  . OSTEOARTHRITIS, KNEES, BILATERAL, SEVERE 06/14/2008  . Metabolic syndrome 123XX123  . Hyperlipidemia 05/10/2007  . Essential hypertension 01/18/2007  . HYPOKALEMIA 04/30/2006  . HX, PERSONAL, PEPTIC ULCER DISEASE 04/30/2006    Current Outpatient Prescriptions on File Prior to Visit  Medication Sig Dispense Refill  . albuterol (PROVENTIL HFA) 108 (90 BASE) MCG/ACT inhaler Inhale 2 puffs into the lungs every 4 (four) hours as needed for wheezing or shortness of breath. 1 Inhaler 4  . amLODipine (NORVASC) 5 MG tablet Take 1 tablet (5 mg total) by mouth daily. If BP not < 140/90 with increased BP meds 90 tablet 1  . aspirin (ECOTRIN LOW STRENGTH) 81 MG EC tablet Take 81 mg by mouth daily.      . budesonide-formoterol (SYMBICORT) 160-4.5 MCG/ACT inhaler one - two inhalations every 12 hours; gargle and spit after use 1 Inhaler 4  . carvedilol (COREG) 25 MG tablet TAKE 1 TABLET BY MOUTH EVERY MORNING AND ONE-HALF TAB EVERY EVENING 135 tablet 2  . Cod Liver Oil CAPS Take 2 capsules by mouth daily.    . DULoxetine (CYMBALTA) 60 MG capsule Take 1 capsule (60 mg total) by mouth daily. 30 capsule 6  . Flaxseed, Linseed, (FLAX SEED OIL) 1000 MG CAPS Take by mouth 3 (three) times daily.      Marland Kitchen  folic acid (FOLVITE) Q000111Q MCG tablet Take 800 mcg by mouth daily.    . furosemide (LASIX) 40 MG tablet Take 1-1.5 Tablets by mouth daily 90 tablet 0  . gabapentin (NEURONTIN) 300 MG capsule Take 1 capsule (300 mg total) by mouth 3 (three) times daily. 90 capsule 6  . glucose blood (ONE TOUCH ULTRA TEST) test strip Use as instructed 100 each 3  . hydrALAZINE (APRESOLINE) 50 MG tablet Take 1 tab by mouth every 8 hrs 270 tablet 1  . Lancets (ONETOUCH ULTRASOFT) lancets Use as instructed 100 each 3  . Multiple Vitamin (MULTIVITAMIN) tablet Take 1 tablet by mouth daily.      . Omega-3 Fatty Acids (FISH OIL) 1000 MG CAPS Take 1 capsule  by mouth daily.     Marland Kitchen omeprazole (PRILOSEC) 40 MG capsule Take 1 capsule (40 mg total) by mouth 2 (two) times daily. 60 capsule 6  . simvastatin (ZOCOR) 40 MG tablet Take 1 tablet (40 mg total) by mouth daily. --- Please establish with new PCP for further refills 90 tablet 1  . topiramate (TOPAMAX) 100 MG tablet Take 1 tablet (100 mg total) by mouth 2 (two) times daily. 60 tablet 11  . verapamil (VERELAN PM) 360 MG 24 hr capsule Take 1 capsule (360 mg total) by mouth daily. 90 capsule 1   Current Facility-Administered Medications on File Prior to Visit  Medication Dose Route Frequency Provider Last Rate Last Dose  . cyanocobalamin ((VITAMIN B-12)) injection 1,000 mcg  1,000 mcg Intramuscular Q30 days Marcial Pacas, MD   1,000 mcg at 04/03/15 1411    Past Medical History  Diagnosis Date  . Meralgia paresthetica     Dr. [name]  . CTS (carpal tunnel syndrome)   . CAD (coronary artery disease)   . Osteoarthrosis, unspecified whether generalized or localized, lower leg   . Diabetes mellitus   . Hypertension   . Hyperlipidemia   . Morbid obesity ([place])   . PUD (peptic ulcer disease)   . Unspecified hereditary and idiopathic peripheral neuropathy   . Morbid obesity ([place])   . Type II or unspecified type diabetes mellitus without mention of complication, not stated as uncontrolled   . Carpal tunnel syndrome   . Asthma   . Colon polyp, hyperplastic 2007 & 2012  . Anemia     Past Surgical History  Procedure Laterality Date  . Abdominal hysterectomy    . Dilation and curettage of uterus      multiple  . Tonsillectomy and adenoidectomy    . Cardiac catheterization  2002    non obstructive disease  . Renal calculi  12/1997    SVT with induction of anesthesia  . Colonoscopy with polypectomy  2007 & 2012     hyperplastic ;Dr Watt Climes    Social History   Social History  . Marital Status: Married    Spouse Name: [name]  . Number of Children: 1  . Years of Education: BS   Occupational History   . Disabled    Social History Main Topics  . Smoking status: Never Smoker   . Smokeless tobacco: Never Used  . Alcohol Use: No  . Drug Use: No  . Sexual Activity: Not on file   Other Topics Concern  . Not on file   Social History Narrative   Patient lives at home with her husband [name] Patient is retired and has a Conservation officer, nature.    Caffeine - some times.   Right handed.  Family History  Problem Relation Age of Onset  . Colon cancer Mother   . Prostate cancer Father   . Colon cancer Father   . Diabetes Maternal Aunt   . Diabetes Maternal Uncle   . Diabetes Paternal Aunt   . Stroke Paternal Aunt     > 65  . Heart disease Paternal Aunt   . Diabetes Paternal Uncle   . Breast cancer Maternal Aunt      X 2    Review of Systems  Constitutional: Negative for fever.  Respiratory: Positive for cough (chronic, varies), shortness of breath (chronic, varies.) and wheezing (chronic, varies).   Cardiovascular: Positive for leg swelling (worse than usual). Negative for chest pain and palpitations.  Gastrointestinal: Positive for nausea (occasional). Negative for abdominal pain.       Sometimes has GERD  Endocrine: Positive for cold intolerance.  Neurological: Positive for dizziness (occasional), light-headedness (occasional) and headaches (occasional).       Objective:   Filed Vitals:   04/10/15 1332  BP: 144/80  Pulse: 71  Temp: 98.1 F (36.7 C)  Resp: 16   Filed Weights   04/10/15 1332  Weight: 393 lb (178.264 kg)   Body mass index is 67.43 kg/(m^2).   Physical Exam Constitutional: Appears well-developed and well-nourished. No distress.  Neck: Neck supple. No tracheal deviation present. No thyromegaly present.  No carotid bruit. No cervical adenopathy.   Cardiovascular: Normal rate, regular rhythm and normal heart sounds.   No murmur heard.  2 + edema with chronic skin changes (hyperpigmentation, thickened, rought skin).  ulcer right lateral leg  just larger than a quarter- no infection/surrounding erythema, clear fluid Pulmonary/Chest: Effort normal and breath sounds normal. No respiratory distress. No wheezes.       Assessment & Plan:   Leg ulcer, right leg No evidence of an infection Keep covered She will monitor quickly  Will work on decreasing edema  See Problem List for Assessment and Plan of chronic medical problems.  F/u in 3 months

## 2015-04-10 NOTE — Assessment & Plan Note (Signed)
Controlled.   -Continue current medication

## 2015-04-10 NOTE — Assessment & Plan Note (Signed)
Taking gabapentin three times daily

## 2015-04-10 NOTE — Assessment & Plan Note (Signed)
bp slightly elevated today - did not take lasix Monitor at home Will not make changes today since we may need to adjust her diuretic  Check cmp

## 2015-04-11 ENCOUNTER — Inpatient Hospital Stay (HOSPITAL_COMMUNITY)
Admission: EM | Admit: 2015-04-11 | Discharge: 2015-04-16 | DRG: 292 | Disposition: A | Discharge status: Skilled Nursing Facility | Payer: Medicare Other | Attending: Internal Medicine | Admitting: Internal Medicine

## 2015-04-11 ENCOUNTER — Encounter (HOSPITAL_COMMUNITY): Payer: Self-pay

## 2015-04-11 ENCOUNTER — Emergency Department (HOSPITAL_COMMUNITY): Payer: Medicare Other

## 2015-04-11 ENCOUNTER — Telehealth: Payer: Self-pay | Admitting: Internal Medicine

## 2015-04-11 DIAGNOSIS — G4733 Obstructive sleep apnea (adult) (pediatric): Secondary | ICD-10-CM | POA: Diagnosis present

## 2015-04-11 DIAGNOSIS — Z86010 Personal history of colonic polyps: Secondary | ICD-10-CM | POA: Diagnosis not present

## 2015-04-11 DIAGNOSIS — Z882 Allergy status to sulfonamides status: Secondary | ICD-10-CM

## 2015-04-11 DIAGNOSIS — R1110 Vomiting, unspecified: Secondary | ICD-10-CM

## 2015-04-11 DIAGNOSIS — I878 Other specified disorders of veins: Secondary | ICD-10-CM | POA: Diagnosis present

## 2015-04-11 DIAGNOSIS — I2510 Atherosclerotic heart disease of native coronary artery without angina pectoris: Secondary | ICD-10-CM | POA: Diagnosis present

## 2015-04-11 DIAGNOSIS — I5032 Chronic diastolic (congestive) heart failure: Principal | ICD-10-CM | POA: Diagnosis present

## 2015-04-11 DIAGNOSIS — I509 Heart failure, unspecified: Secondary | ICD-10-CM | POA: Diagnosis not present

## 2015-04-11 DIAGNOSIS — Z833 Family history of diabetes mellitus: Secondary | ICD-10-CM | POA: Diagnosis not present

## 2015-04-11 DIAGNOSIS — M179 Osteoarthritis of knee, unspecified: Secondary | ICD-10-CM | POA: Diagnosis present

## 2015-04-11 DIAGNOSIS — R609 Edema, unspecified: Secondary | ICD-10-CM

## 2015-04-11 DIAGNOSIS — J45909 Unspecified asthma, uncomplicated: Secondary | ICD-10-CM | POA: Diagnosis present

## 2015-04-11 DIAGNOSIS — Z6841 Body Mass Index (BMI) 40.0 and over, adult: Secondary | ICD-10-CM | POA: Diagnosis not present

## 2015-04-11 DIAGNOSIS — Z79899 Other long term (current) drug therapy: Secondary | ICD-10-CM

## 2015-04-11 DIAGNOSIS — R600 Localized edema: Secondary | ICD-10-CM | POA: Diagnosis present

## 2015-04-11 DIAGNOSIS — I10 Essential (primary) hypertension: Secondary | ICD-10-CM | POA: Diagnosis present

## 2015-04-11 DIAGNOSIS — I890 Lymphedema, not elsewhere classified: Secondary | ICD-10-CM | POA: Diagnosis present

## 2015-04-11 DIAGNOSIS — E1140 Type 2 diabetes mellitus with diabetic neuropathy, unspecified: Secondary | ICD-10-CM | POA: Diagnosis present

## 2015-04-11 DIAGNOSIS — L03115 Cellulitis of right lower limb: Secondary | ICD-10-CM

## 2015-04-11 DIAGNOSIS — Z8711 Personal history of peptic ulcer disease: Secondary | ICD-10-CM | POA: Diagnosis not present

## 2015-04-11 DIAGNOSIS — L03116 Cellulitis of left lower limb: Secondary | ICD-10-CM | POA: Diagnosis not present

## 2015-04-11 DIAGNOSIS — J9611 Chronic respiratory failure with hypoxia: Secondary | ICD-10-CM | POA: Diagnosis present

## 2015-04-11 DIAGNOSIS — Z8249 Family history of ischemic heart disease and other diseases of the circulatory system: Secondary | ICD-10-CM

## 2015-04-11 DIAGNOSIS — T502X5A Adverse effect of carbonic-anhydrase inhibitors, benzothiadiazides and other diuretics, initial encounter: Secondary | ICD-10-CM | POA: Diagnosis present

## 2015-04-11 DIAGNOSIS — N179 Acute kidney failure, unspecified: Secondary | ICD-10-CM | POA: Diagnosis present

## 2015-04-11 DIAGNOSIS — R0902 Hypoxemia: Secondary | ICD-10-CM

## 2015-04-11 DIAGNOSIS — R112 Nausea with vomiting, unspecified: Secondary | ICD-10-CM | POA: Diagnosis present

## 2015-04-11 DIAGNOSIS — I5033 Acute on chronic diastolic (congestive) heart failure: Secondary | ICD-10-CM | POA: Diagnosis present

## 2015-04-11 HISTORY — DX: Cellulitis of right lower limb: L03.115

## 2015-04-11 LAB — CBC
HEMATOCRIT: 39.2 % (ref 36.0–46.0)
Hemoglobin: 12.1 g/dL (ref 12.0–15.0)
MCH: 29.2 pg (ref 26.0–34.0)
MCHC: 30.9 g/dL (ref 30.0–36.0)
MCV: 94.7 fL (ref 78.0–100.0)
PLATELETS: 164 10*3/uL (ref 150–400)
RBC: 4.14 MIL/uL (ref 3.87–5.11)
RDW: 15.6 % — ABNORMAL HIGH (ref 11.5–15.5)
WBC: 5 10*3/uL (ref 4.0–10.5)

## 2015-04-11 LAB — COMPREHENSIVE METABOLIC PANEL
ALT: 13 U/L — AB (ref 14–54)
AST: 16 U/L (ref 15–41)
Albumin: 3.7 g/dL (ref 3.5–5.0)
Alkaline Phosphatase: 68 U/L (ref 38–126)
Anion gap: 7 (ref 5–15)
BILIRUBIN TOTAL: 0.6 mg/dL (ref 0.3–1.2)
BUN: 19 mg/dL (ref 6–20)
CALCIUM: 8.8 mg/dL — AB (ref 8.9–10.3)
CHLORIDE: 107 mmol/L (ref 101–111)
CO2: 29 mmol/L (ref 22–32)
CREATININE: 1.19 mg/dL — AB (ref 0.44–1.00)
GFR, EST AFRICAN AMERICAN: 55 mL/min — AB (ref >60)
GFR, EST NON AFRICAN AMERICAN: 47 mL/min — AB (ref >60)
Glucose, Bld: 91 mg/dL (ref 65–99)
Potassium: 3.6 mmol/L (ref 3.5–5.1)
Sodium: 143 mmol/L (ref 135–145)
TOTAL PROTEIN: 7.1 g/dL (ref 6.5–8.1)

## 2015-04-11 LAB — BRAIN NATRIURETIC PEPTIDE: B NATRIURETIC PEPTIDE 5: 75.2 pg/mL (ref 0.0–100.0)

## 2015-04-11 LAB — BLOOD GAS, VENOUS
ACID-BASE EXCESS: 1.3 mmol/L (ref 0.0–2.0)
BICARBONATE: 26.6 meq/L — AB (ref 20.0–24.0)
Drawn by: 235321
O2 Saturation: 55.6 %
PCO2 VEN: 47.4 mmHg (ref 45.0–50.0)
Patient temperature: 98.2
TCO2: 24.8 mmol/L (ref 0–100)
pH, Ven: 7.367 — ABNORMAL HIGH (ref 7.250–7.300)

## 2015-04-11 LAB — HEPATITIS C ANTIBODY: HCV Ab: NEGATIVE

## 2015-04-11 LAB — HIV ANTIBODY (ROUTINE TESTING W REFLEX): HIV 1&2 Ab, 4th Generation: NONREACTIVE

## 2015-04-11 MED ORDER — FUROSEMIDE 10 MG/ML IJ SOLN
40.0000 mg | Freq: Once | INTRAMUSCULAR | Status: AC
  Administered 2015-04-11: 40 mg via INTRAVENOUS
  Filled 2015-04-11: qty 4

## 2015-04-11 MED ORDER — ASPIRIN EC 81 MG PO TBEC
81.0000 mg | DELAYED_RELEASE_TABLET | Freq: Every day | ORAL | Status: DC
  Administered 2015-04-12 – 2015-04-16 (×5): 81 mg via ORAL
  Filled 2015-04-11 (×5): qty 1

## 2015-04-11 MED ORDER — OXYCODONE-ACETAMINOPHEN 5-325 MG PO TABS
1.0000 | ORAL_TABLET | Freq: Once | ORAL | Status: AC
  Administered 2015-04-11: 1 via ORAL
  Filled 2015-04-11: qty 1

## 2015-04-11 MED ORDER — SODIUM CHLORIDE 0.9% FLUSH: 3.0000 mL | INTRAVENOUS | Status: DC | PRN

## 2015-04-11 MED ORDER — FUROSEMIDE 10 MG/ML IJ SOLN
40.0000 mg | Freq: Three times a day (TID) | INTRAMUSCULAR | Status: DC
  Administered 2015-04-11 – 2015-04-14 (×8): 40 mg via INTRAVENOUS
  Filled 2015-04-11 (×11): qty 4

## 2015-04-11 MED ORDER — COD LIVER OIL PO CAPS: 2.0000 | ORAL_CAPSULE | Freq: Every day | ORAL | Status: DC

## 2015-04-11 MED ORDER — OMEGA-3-ACID ETHYL ESTERS 1 G PO CAPS
1.0000 g | ORAL_CAPSULE | Freq: Every day | ORAL | Status: DC
  Administered 2015-04-12 – 2015-04-16 (×5): 1 g via ORAL
  Filled 2015-04-11 (×5): qty 1

## 2015-04-11 MED ORDER — PANTOPRAZOLE SODIUM 40 MG PO TBEC
40.0000 mg | DELAYED_RELEASE_TABLET | Freq: Every day | ORAL | Status: DC
  Administered 2015-04-12 – 2015-04-16 (×5): 40 mg via ORAL
  Filled 2015-04-11 (×5): qty 1

## 2015-04-11 MED ORDER — ENOXAPARIN SODIUM 40 MG/0.4ML ~~LOC~~ SOLN: 40.0000 mg | SUBCUTANEOUS | Status: DC

## 2015-04-11 MED ORDER — BISACODYL 10 MG RE SUPP: 10.0000 mg | Freq: Every day | RECTAL | Status: DC | PRN

## 2015-04-11 MED ORDER — DOCUSATE SODIUM 100 MG PO CAPS
100.0000 mg | ORAL_CAPSULE | Freq: Two times a day (BID) | ORAL | Status: DC
  Administered 2015-04-12 – 2015-04-16 (×9): 100 mg via ORAL
  Filled 2015-04-11 (×10): qty 1

## 2015-04-11 MED ORDER — ENOXAPARIN SODIUM 80 MG/0.8ML ~~LOC~~ SOLN
80.0000 mg | Freq: Every day | SUBCUTANEOUS | Status: DC
  Administered 2015-04-11 – 2015-04-15 (×5): 80 mg via SUBCUTANEOUS
  Filled 2015-04-11 (×6): qty 0.8

## 2015-04-11 MED ORDER — CARVEDILOL 12.5 MG PO TABS
12.5000 mg | ORAL_TABLET | Freq: Two times a day (BID) | ORAL | Status: DC
  Administered 2015-04-12 – 2015-04-16 (×8): 12.5 mg via ORAL
  Filled 2015-04-11 (×11): qty 1

## 2015-04-11 MED ORDER — FLAX SEED OIL 1000 MG PO CAPS: 1000.0000 mg | ORAL_CAPSULE | Freq: Two times a day (BID) | ORAL | Status: DC

## 2015-04-11 MED ORDER — ACETAMINOPHEN 650 MG RE SUPP: 650.0000 mg | Freq: Four times a day (QID) | RECTAL | Status: DC | PRN

## 2015-04-11 MED ORDER — ACETAMINOPHEN 325 MG PO TABS
650.0000 mg | ORAL_TABLET | Freq: Four times a day (QID) | ORAL | Status: DC | PRN
  Administered 2015-04-13: 650 mg via ORAL
  Filled 2015-04-11: qty 2

## 2015-04-11 MED ORDER — GABAPENTIN 300 MG PO CAPS
300.0000 mg | ORAL_CAPSULE | Freq: Three times a day (TID) | ORAL | Status: DC
  Administered 2015-04-11 – 2015-04-16 (×14): 300 mg via ORAL
  Filled 2015-04-11 (×16): qty 1

## 2015-04-11 MED ORDER — ADULT MULTIVITAMIN W/MINERALS CH
1.0000 | ORAL_TABLET | Freq: Every day | ORAL | Status: DC
  Administered 2015-04-12 – 2015-04-16 (×5): 1 via ORAL
  Filled 2015-04-11 (×5): qty 1

## 2015-04-11 MED ORDER — FOLIC ACID 0.5 MG HALF TAB
500.0000 ug | ORAL_TABLET | Freq: Every day | ORAL | Status: DC
  Administered 2015-04-12 – 2015-04-16 (×5): 0.5 mg via ORAL
  Filled 2015-04-11 (×5): qty 1

## 2015-04-11 MED ORDER — HYDRALAZINE HCL 50 MG PO TABS
50.0000 mg | ORAL_TABLET | Freq: Three times a day (TID) | ORAL | Status: DC
  Administered 2015-04-11 – 2015-04-16 (×14): 50 mg via ORAL
  Filled 2015-04-11 (×17): qty 1

## 2015-04-11 MED ORDER — HYDROCODONE-ACETAMINOPHEN 5-325 MG PO TABS
1.0000 | ORAL_TABLET | ORAL | Status: DC | PRN
  Administered 2015-04-12 – 2015-04-16 (×4): 2 via ORAL
  Filled 2015-04-11 (×5): qty 2

## 2015-04-11 MED ORDER — MOMETASONE FURO-FORMOTEROL FUM 200-5 MCG/ACT IN AERO
2.0000 | INHALATION_SPRAY | Freq: Two times a day (BID) | RESPIRATORY_TRACT | Status: DC
  Administered 2015-04-12 – 2015-04-16 (×9): 2 via RESPIRATORY_TRACT
  Filled 2015-04-11: qty 8.8

## 2015-04-11 MED ORDER — AMLODIPINE BESYLATE 5 MG PO TABS
5.0000 mg | ORAL_TABLET | Freq: Every day | ORAL | Status: DC
  Administered 2015-04-12 – 2015-04-16 (×5): 5 mg via ORAL
  Filled 2015-04-11 (×5): qty 1

## 2015-04-11 MED ORDER — CLINDAMYCIN PHOSPHATE 600 MG/50ML IV SOLN
600.0000 mg | Freq: Three times a day (TID) | INTRAVENOUS | Status: DC
  Administered 2015-04-11 – 2015-04-12 (×2): 600 mg via INTRAVENOUS
  Filled 2015-04-11 (×3): qty 50

## 2015-04-11 MED ORDER — SIMVASTATIN 40 MG PO TABS: 40.0000 mg | ORAL_TABLET | Freq: Every day | ORAL | Status: DC

## 2015-04-11 MED ORDER — SODIUM CHLORIDE 0.9% FLUSH
3.0000 mL | Freq: Two times a day (BID) | INTRAVENOUS | Status: DC
Start: 2015-04-11 — End: 2015-04-16
  Administered 2015-04-11 – 2015-04-16 (×5): 3 mL via INTRAVENOUS

## 2015-04-11 MED ORDER — ATORVASTATIN CALCIUM 20 MG PO TABS
20.0000 mg | ORAL_TABLET | Freq: Every day | ORAL | Status: DC
  Administered 2015-04-12 – 2015-04-15 (×4): 20 mg via ORAL
  Filled 2015-04-11 (×5): qty 1

## 2015-04-11 MED ORDER — VERAPAMIL HCL ER 180 MG PO TBCR
360.0000 mg | EXTENDED_RELEASE_TABLET | Freq: Every day | ORAL | Status: DC
  Administered 2015-04-12 – 2015-04-16 (×5): 360 mg via ORAL
  Filled 2015-04-11 (×5): qty 2

## 2015-04-11 MED ORDER — SODIUM CHLORIDE 0.9 % IV SOLN: 250.0000 mL | INTRAVENOUS | Status: DC | PRN

## 2015-04-11 NOTE — ED Provider Notes (Signed)
CSN: [id number]     Arrival date & time 04/11/15  1454 History   First MD Initiated Contact with Patient 04/11/15 1853     Chief Complaint  Patient presents with  . Leg Swelling  . Wound Check     The history is provided by the patient. No language interpreter was used.    April [name] is a 65 y.o. female who presents to the Emergency Department complaining of leg swelling.  She has a history of chronic bilateral lower extremity edema. The swelling has been there for at least a year. She reports increasing swelling lately and without a week of blisters to the right leg. She saw her PCP yesterday for the leg blisters and was instructed to dress the wounds. She presents today because she is having significant pain with walking and cannot find a comfortable position. The blisters are leaking watery fluid and/or requiring multiple dressing changes and she is having difficulty keeping a dressing on. She denies any fevers but is chronically hot and cold. She has chronic shortness of breath, unchanged from baseline.  Past Medical History  Diagnosis Date  . Meralgia paresthetica     Dr. [name]  . CTS (carpal tunnel syndrome)   . CAD (coronary artery disease)   . Osteoarthrosis, unspecified whether generalized or localized, lower leg   . Diabetes mellitus   . Hypertension   . Hyperlipidemia   . Morbid obesity ([place])   . PUD (peptic ulcer disease)   . Unspecified hereditary and idiopathic peripheral neuropathy   . Morbid obesity ([place])   . Type II or unspecified type diabetes mellitus without mention of complication, not stated as uncontrolled   . Carpal tunnel syndrome   . Asthma   . Colon polyp, hyperplastic 2007 & 2012  . Anemia    Past Surgical History  Procedure Laterality Date  . Abdominal hysterectomy    . Dilation and curettage of uterus      multiple  . Tonsillectomy and adenoidectomy    . Cardiac catheterization  2002    non obstructive disease  . Renal calculi  12/1997    SVT  with induction of anesthesia  . Colonoscopy with polypectomy  2007 & 2012     hyperplastic ;Dr Watt Climes   Family History  Problem Relation Age of Onset  . Colon cancer Mother   . Prostate cancer Father   . Colon cancer Father   . Diabetes Maternal Aunt   . Diabetes Maternal Uncle   . Diabetes Paternal Aunt   . Stroke Paternal Aunt     > 65  . Heart disease Paternal Aunt   . Diabetes Paternal Uncle   . Breast cancer Maternal Aunt      X 2   Social History  Substance Use Topics  . Smoking status: Never Smoker   . Smokeless tobacco: Never Used  . Alcohol Use: No   OB History    No data available     Review of Systems  All other systems reviewed and are negative.     Allergies  Penicillins; Benazepril hcl; Hydrochlorothiazide w-triamterene; Metronidazole; Spironolactone; Sulfonamide derivatives; Torsemide; and Valsartan  Home Medications   Prior to Admission medications   Medication Sig Start Date End Date Taking? Authorizing Provider  albuterol (PROVENTIL HFA) 108 (90 BASE) MCG/ACT inhaler Inhale 2 puffs into the lungs every 4 (four) hours as needed for wheezing or shortness of breath. 03/13/14 04/20/15 Yes Hendricks Limes, MD  amLODipine (NORVASC) 5 MG tablet  Take 1 tablet (5 mg total) by mouth daily. If BP not < 140/90 with increased BP meds 09/10/14  Yes Hendricks Limes, MD  aspirin (ECOTRIN LOW STRENGTH) 81 MG EC tablet Take 81 mg by mouth daily.     Yes Historical Provider, MD  budesonide-formoterol (SYMBICORT) 160-4.5 MCG/ACT inhaler one - two inhalations every 12 hours; gargle and spit after use Patient taking differently: Inhale 1-2 puffs into the lungs every 12 (twelve) hours as needed (SOB, wheezing). one - two inhalations every 12 hours; gargle and spit after use 02/19/15  Yes [name], MD  carvedilol (COREG) 25 MG tablet TAKE 1 TABLET BY MOUTH EVERY MORNING AND ONE-HALF TAB EVERY EVENING Patient taking differently: Take 12.5-25 mg by mouth 2 (two) times daily  with a meal. TAKE 1 TABLET BY MOUTH EVERY MORNING AND ONE-HALF TAB EVERY EVENING 06/15/14  Yes Hendricks Limes, MD  [facility] Liver Oil CAPS Take 2 capsules by mouth daily.   Yes Historical Provider, MD  Flaxseed, Linseed, (FLAX SEED OIL) 1000 MG CAPS Take 1,000 mg by mouth 2 (two) times daily.    Yes Historical Provider, MD  folic acid (FOLVITE) [id number] MCG tablet Take 800 mcg by mouth daily.   Yes Historical Provider, MD  furosemide (LASIX) 40 MG tablet Take 1-1.5 Tablets by mouth daily Patient taking differently: Take 60 mg by mouth daily.  09/14/14  Yes Hendricks Limes, MD  gabapentin (NEURONTIN) 300 MG capsule Take 1 capsule (300 mg total) by mouth 3 (three) times daily. 01/03/15  Yes Dennie Bible, NP  glucose blood (ONE TOUCH ULTRA TEST) test strip Use as instructed 03/04/12  Yes Hendricks Limes, MD  hydrALAZINE (APRESOLINE) 50 MG tablet Take 1 tab by mouth every 8 hrs Patient taking differently: Take 50 mg by mouth every 8 (eight) hours.  06/13/14  Yes Hendricks Limes, MD  Lancets [facility] ULTRASOFT) lancets Use as instructed 03/04/12  Yes Hendricks Limes, MD  Multiple Vitamin (MULTIVITAMIN) tablet Take 1 tablet by mouth daily.     Yes Historical Provider, MD  Omega-3 Fatty Acids (FISH OIL) 1000 MG CAPS Take 1,000 mg by mouth daily.    Yes Historical Provider, MD  omeprazole (PRILOSEC) 40 MG capsule Take 1 capsule (40 mg total) by mouth 2 (two) times daily. Patient taking differently: Take 40 mg by mouth daily as needed (heartburn).  08/22/14  Yes Jerene Bears, MD  simvastatin (ZOCOR) 40 MG tablet Take 1 tablet (40 mg total) by mouth daily. --- Please establish with new PCP for further refills 01/23/15  Yes Hendricks Limes, MD  topiramate (TOPAMAX) 100 MG tablet Take 1 tablet (100 mg total) by mouth 2 (two) times daily. 07/04/14  Yes Marcial Pacas, MD  verapamil (VERELAN PM) 360 MG 24 hr capsule Take 1 capsule (360 mg total) by mouth daily. 03/20/15  Yes [name], MD  DULoxetine (CYMBALTA) 60 MG  capsule Take 1 capsule (60 mg total) by mouth daily. Patient not taking: Reported on 04/11/2015 01/03/15   Dennie Bible, NP   BP 165/80 mmHg  Pulse 77  Temp(Src) 98.2 F (36.8 C) (Oral)  Resp 18  Wt 393 lb 11.9 oz (178.6 kg)  SpO2 94% Physical Exam  Constitutional: She is oriented to person, place, and time. She appears well-developed and well-nourished.  Morbidly obese  HENT:  Head: Normocephalic and atraumatic.  Cardiovascular: Normal rate and regular rhythm.   No murmur heard. Pulmonary/Chest: Effort normal. No respiratory distress.  Distant lung sounds  Abdominal: Soft. There is no tenderness. There is no rebound and no guarding.  Musculoskeletal:  2+ DP pulses in bilateral lower extremities.  There is 4+ edema to bilateral lower extremities. There is mild erythema to the right leg. There is a ruptured vesicle to the right lateral leg that is approximately 2 cm in diameter. There is a pink tissue base and it is draining clear fluid. There is moderate diffuse tenderness to palpation throughout the right leg and foot.  Neurological: She is alert and oriented to person, place, and time.  Skin: Skin is warm and dry.  Psychiatric: She has a normal mood and affect. Her behavior is normal.  Nursing note and vitals reviewed.   ED Course  Procedures (including critical care time) Labs Review Labs Reviewed  CBC - Abnormal; Notable for the following:    RDW 15.6 (*)    All other components within normal limits  COMPREHENSIVE METABOLIC PANEL - Abnormal; Notable for the following:    Creatinine, Ser 1.19 (*)    Calcium 8.8 (*)    ALT 13 (*)    GFR calc non Af Amer 47 (*)    GFR calc Af Amer 55 (*)    All other components within normal limits  BLOOD GAS, VENOUS - Abnormal; Notable for the following:    pH, Ven 7.367 (*)    Bicarbonate 26.6 (*)    All other components within normal limits  BRAIN NATRIURETIC PEPTIDE  URINALYSIS, ROUTINE W REFLEX MICROSCOPIC (NOT AT [facility])     Imaging Review Dg Chest 2 View  04/11/2015  CLINICAL DATA:  Leg edema and pain with swelling. Hypertension diabetes. EXAM: CHEST  2 VIEW COMPARISON:  09/05/2013 FINDINGS: Atherosclerotic aortic arch. Thoracic spondylosis. Mild enlargement of the cardiopericardial silhouette with indistinct upper zone pulmonary vasculature. No overt edema. No blunting of the costophrenic angles. Body habitus reduces diagnostic sensitivity and specificity. IMPRESSION: 1. Mild enlargement of the cardiopericardial silhouette with pulmonary venous hypertension but no overt edema. 2. Thoracic spondylosis. Electronically Signed   By: Van Clines M.D.   On: 04/11/2015 20:15   I have personally reviewed and evaluated these images and lab results as part of my medical decision-making.   EKG Interpretation None      MDM   Final diagnoses:  Bilateral leg edema   Patient here for evaluation of right leg pain following opening of a blister. She has had approximately 40 pound weight gain over the last week. Concern for progressive edema that will need diuresis as patient is barely able to ambulate at home due to her swelling. Plan to admit for IV diuresis. Hospitalist consulted for admission.    Quintella Reichert, MD 04/12/15 [phone]

## 2015-04-11 NOTE — Telephone Encounter (Signed)
States Dr. Quay Burow told her yesterday not to use gauze.  States wound is running and nothing is sticking or staying.   Also states another wound has came up that is doing the same thing.  Please call back in regards.  Patient states she can not get into the bed to lay down because she is afraid of wound getting infected.

## 2015-04-11 NOTE — Progress Notes (Signed)
PHARMACIST - PHYSICIAN ORDER COMMUNICATION  CONCERNING: P&T Medication Policy on Herbal Medications  DESCRIPTION:  This patient's order for:  Cod liver oil and flax seed oil    has been noted.  This product(s) is classified as an "herbal" or natural product. Due to a lack of definitive safety studies or FDA approval, nonstandard manufacturing practices, plus the potential risk of unknown drug-drug interactions while on inpatient medications, the Pharmacy and Therapeutics Committee does not permit the use of "herbal" or natural products of this type within [facility].   ACTION TAKEN: The pharmacy department is unable to verify this order at this time and your patient has been informed of this safety policy. Please reevaluate patient's clinical condition at discharge and address if the herbal or natural product(s) should be resumed at that time.   Thanks Dorrene German 04/11/2015 10:33 PM

## 2015-04-11 NOTE — Progress Notes (Signed)
The order for simvastatin(Zocor) was changed to an equivalent dose of atorvastatin(Lipitor) due to the potential drug interaction with Verapamil and Amlodipine.  When taken in combination with medications that inhibit its metabolism, simvastatin can accumulate which increases the risk of liver toxicity, myopathy, or rhabdomyolysis.  Simvastatin dose should not exceed 10mg /day in patients taking verapamil, diltiazem, fibrates, or niacin >or= 1g/day.   Simvastatin dose should not exceed 20mg /day in patients taking amlodipine, ranolazine or amiodarone.   Please consider this potential interaction at discharge.  April [name] 04/11/2015 11:32 PM

## 2015-04-11 NOTE — Progress Notes (Signed)
Attempted arterial stick without success. Blood obtained and found to be venous. RN aware. Awaiting further orders for ABG vs VBG.

## 2015-04-11 NOTE — Telephone Encounter (Signed)
Pt called said she is still having some issus with her wound draining.  She would like to speak with nurse about it.  Can you call her when you get a chance?

## 2015-04-11 NOTE — Telephone Encounter (Signed)
Can apply neosporin and keep it covered with gauze.  Monitor closely for infection

## 2015-04-11 NOTE — H&P (Addendum)
[facility] History and Physical  April [name] [id number] DOB: 09-Aug-1950 DOA: 04/11/2015  Referring physician: Dr. Ralene Bathe PCP: [name], MD   Chief Complaint: Leg swelling  HPI: April [name] is a 65 y.o. female with history of CAD, DJD, DM, HTN, HL, obesity, PUD, asthma and anemia who presented to ED today with bilat LE swelling and leg pain 8/10.  Swelling accompanied by blister formation on R leg.  Seen by PCP yesterday. Taking lasix 40-60 mg per day.  She says it's "just not working anymore".  She has pain in both legs and difficulty walking.  Symptoms worse over last few weeks.  Says this is first time in hospital for this.  Takes occ NSAID's for knee arthritis pain.    Sees Dr Haroldine Laws, not sure why she sees him. Last seen there in Jan 2013.  Sees PCP at [facility].    Denies any chest pain, orthopnea/prod cough./ PND.  No abd pain, n/v/d. No hx renal disease or liver disease.    Chart review: Jun 2002 - left sided numbness and HA, no definite CVA. Prob d/t HA. HL, HTN, obesity, DM, OSA, asthma Jun 2008 - SOB > w.u included heart cath (nonobstructive disease but ^LVEDP of 37) and V/Q which was negative. Felt better, dc'd home.   ROS  denies CP  no joint pain   no HA  no blurry vision  no rash  no diarrhea  no nausea/ vomiting  no dysuria  no difficulty voiding  no change in urine color    Where does patient live home  Can patient participate in ADLs? some  Past Medical History  Past Medical History  Diagnosis Date  . Meralgia paresthetica     Dr. [name]  . CTS (carpal tunnel syndrome)   . CAD (coronary artery disease)   . Osteoarthrosis, unspecified whether generalized or localized, lower leg   . Diabetes mellitus   . Hypertension   . Hyperlipidemia   . Morbid obesity ([place])   . PUD (peptic ulcer disease)   . Unspecified hereditary and idiopathic peripheral neuropathy   . Morbid obesity ([place])   . Type II or unspecified type diabetes mellitus without  mention of complication, not stated as uncontrolled   . Carpal tunnel syndrome   . Asthma   . Colon polyp, hyperplastic 2007 & 2012  . Anemia    Past Surgical History  Past Surgical History  Procedure Laterality Date  . Abdominal hysterectomy    . Dilation and curettage of uterus      multiple  . Tonsillectomy and adenoidectomy    . Cardiac catheterization  2002    non obstructive disease  . Renal calculi  12/1997    SVT with induction of anesthesia  . Colonoscopy with polypectomy  2007 & 2012     hyperplastic ;Dr Watt Climes   Family History  Family History  Problem Relation Age of Onset  . Colon cancer Mother   . Prostate cancer Father   . Colon cancer Father   . Diabetes Maternal Aunt   . Diabetes Maternal Uncle   . Diabetes Paternal Aunt   . Stroke Paternal Aunt     > 65  . Heart disease Paternal Aunt   . Diabetes Paternal Uncle   . Breast cancer Maternal Aunt      X 2   Social History  reports that she has never smoked. She has never used smokeless tobacco. She reports that she does not drink alcohol or  use illicit drugs. Allergies  Allergies  Allergen Reactions  . Penicillins Rash    Has patient had a PCN reaction causing immediate rash, facial/tongue/throat swelling, SOB or lightheadedness with hypotension: yes Has patient had a PCN reaction causing severe rash involving mucus membranes or skin necrosis: no Has patient had a PCN reaction that required hospitalization no Has patient had a PCN reaction occurring within the last 10 years: no If all of the above answers are "NO", then may proceed with Cephalosporin use.'  . Benazepril Hcl Other (See Comments)    Unknown allergy  . Hydrochlorothiazide W-Triamterene     REACTION: hypokalemia  . Metronidazole Other (See Comments)    Unknown allergy  . Spironolactone     REACTION: kidney problems  . Sulfonamide Derivatives     REACTION: internal "burning"  . Torsemide Other (See Comments)    Unknown allergy  .  Valsartan Other (See Comments)    Unknown allergy   Home medications Prior to Admission medications   Medication Sig Start Date End Date Taking? Authorizing Provider  albuterol (PROVENTIL HFA) 108 (90 BASE) MCG/ACT inhaler Inhale 2 puffs into the lungs every 4 (four) hours as needed for wheezing or shortness of breath. 03/13/14 04/20/15 Yes Hendricks Limes, MD  amLODipine (NORVASC) 5 MG tablet Take 1 tablet (5 mg total) by mouth daily. If BP not < 140/90 with increased BP meds 09/10/14  Yes Hendricks Limes, MD  aspirin (ECOTRIN LOW STRENGTH) 81 MG EC tablet Take 81 mg by mouth daily.     Yes Historical Provider, MD  budesonide-formoterol (SYMBICORT) 160-4.5 MCG/ACT inhaler one - two inhalations every 12 hours; gargle and spit after use Patient taking differently: Inhale 1-2 puffs into the lungs every 12 (twelve) hours as needed (SOB, wheezing). one - two inhalations every 12 hours; gargle and spit after use 02/19/15  Yes [name], MD  carvedilol (COREG) 25 MG tablet TAKE 1 TABLET BY MOUTH EVERY MORNING AND ONE-HALF TAB EVERY EVENING Patient taking differently: Take 12.5-25 mg by mouth 2 (two) times daily with a meal. TAKE 1 TABLET BY MOUTH EVERY MORNING AND ONE-HALF TAB EVERY EVENING 06/15/14  Yes Hendricks Limes, MD  [facility] Liver Oil CAPS Take 2 capsules by mouth daily.   Yes Historical Provider, MD  Flaxseed, Linseed, (FLAX SEED OIL) 1000 MG CAPS Take 1,000 mg by mouth 2 (two) times daily.    Yes Historical Provider, MD  folic acid (FOLVITE) 056 MCG tablet Take 800 mcg by mouth daily.   Yes Historical Provider, MD  furosemide (LASIX) 40 MG tablet Take 1-1.5 Tablets by mouth daily Patient taking differently: Take 60 mg by mouth daily.  09/14/14  Yes Hendricks Limes, MD  gabapentin (NEURONTIN) 300 MG capsule Take 1 capsule (300 mg total) by mouth 3 (three) times daily. 01/03/15  Yes Dennie Bible, NP  glucose blood (ONE TOUCH ULTRA TEST) test strip Use as instructed 03/04/12  Yes Hendricks Limes, MD  hydrALAZINE (APRESOLINE) 50 MG tablet Take 1 tab by mouth every 8 hrs Patient taking differently: Take 50 mg by mouth every 8 (eight) hours.  06/13/14  Yes Hendricks Limes, MD  Lancets [facility] ULTRASOFT) lancets Use as instructed 03/04/12  Yes Hendricks Limes, MD  Multiple Vitamin (MULTIVITAMIN) tablet Take 1 tablet by mouth daily.     Yes Historical Provider, MD  Omega-3 Fatty Acids (FISH OIL) 1000 MG CAPS Take 1,000 mg by mouth daily.    Yes Historical Provider,  MD  omeprazole (PRILOSEC) 40 MG capsule Take 1 capsule (40 mg total) by mouth 2 (two) times daily. Patient taking differently: Take 40 mg by mouth daily as needed (heartburn).  08/22/14  Yes Jerene Bears, MD  simvastatin (ZOCOR) 40 MG tablet Take 1 tablet (40 mg total) by mouth daily. --- Please establish with new PCP for further refills 01/23/15  Yes Hendricks Limes, MD  topiramate (TOPAMAX) 100 MG tablet Take 1 tablet (100 mg total) by mouth 2 (two) times daily. 07/04/14  Yes Marcial Pacas, MD  verapamil (VERELAN PM) 360 MG 24 hr capsule Take 1 capsule (360 mg total) by mouth daily. 03/20/15  Yes [name], MD  DULoxetine (CYMBALTA) 60 MG capsule Take 1 capsule (60 mg total) by mouth daily. Patient not taking: Reported on 04/11/2015 01/03/15   Dennie Bible, NP   Liver Function Tests  Recent Labs Lab 04/10/15 1505 04/11/15 1606  AST 12 16  ALT 9 13*  ALKPHOS 60 68  BILITOT 0.5 0.6  PROT 6.3 7.1  ALBUMIN 3.7 3.7   No results for input(s): LIPASE, AMYLASE in the last 168 hours. CBC  Recent Labs Lab 04/11/15 1606  WBC 5.0  HGB 12.1  HCT 39.2  MCV 94.7  PLT 675   Basic Metabolic Panel  Recent Labs Lab 04/10/15 1505 04/11/15 1606  NA 143 143  K 3.3* 3.6  CL 108 107  CO2 28 29  GLUCOSE 89 91  BUN 18 19  CREATININE 1.01 1.19*  CALCIUM 9.1 8.8*     Filed Vitals:   04/11/15 1528 04/11/15 1942  BP: 165/88 149/84  Pulse: 95 76  Temp: 98.3 F (36.8 C)   TempSrc: Oral   Resp: 22 20  SpO2:  92% 94%   Exam: Alert morbidly obese AAF 400 lbs No rash, cyanosis or gangrene Sclera anicteric, throat clear NO jvd Chest clear bilat RRR no MRG noted Abd morbidly obese, nontender, no ascites grossly, no hsm GU defer MS no joint effusions Ext bilat 2-3+ LE edema mostly below the knees with bilat tender erythema of both lower legs, no abscess Some oozing of fluid from ruptured blister on R leg lateral aspect Good distal perfusion, toes warm Neuro is alert, Ox 3, nonfocal  EKG (independently reviewed) > NSR no acute changes CXR (independently reviewed) > not done yet   Home medications:  albuterol, symbicort Hydralazine, carvedilol, amlodipine, lasix 40-60/d, verapamil 360/d neurontin 300 tid, insulin, zocor, topamax, asa folic acid, Q-49, MVI  Na 143 K 3.6  COR 29  BUN 19  Creat 1.19 eGFR 55  Alb 3.7  LFT's ok  BNP 75   WBC 5k  Hb 12 plt 164  Hb A1C 5.9 Recent labs > hep C negative, HIV nonreactive, urine microalb ratio normal  Assessment: 1 Severe bilateral leg edema - suspect chron diastolic CHF primary issue, 2nd would be R HF/ pulm HTN. Used to f/w Dr Haroldine Laws but last note is 2013.  On low lasix dose.  No pulm edema on exam.  2 Cellulitis bilat LE's 3 Morbid obesity 4 DM diet controlled, recent A1C <6 5 HTN chronic issue on 4 BP meds and lasix 6 Asthma - not currently active 7 Neuropathy LE's - on neurontin 8 PCN/sulfa allergy  Plan - admit, IV lasix, IV abx Clindamycin. ECHO in am. ABG r/o OHS/ CO2 retention.    DVT Prophylaxis lovenox  Code Status: full  Family Communication: husband here  Disposition Plan: home when better  Sol Blazing [facility] Pager [phone]  Cell [phone]  If 7PM-7AM, please contact night-coverage [email or web address] Password [facility] 04/11/2015, 7:49 PM

## 2015-04-11 NOTE — Progress Notes (Signed)
Spoke with [name], MD during Emergency Room rounding. Order received - okay for venous blood gas. RN made aware.

## 2015-04-11 NOTE — Telephone Encounter (Signed)
Please advise on what pt should do for her wound.

## 2015-04-11 NOTE — ED Notes (Signed)
Patient transported to X-ray 

## 2015-04-11 NOTE — ED Notes (Signed)
Pt c/o increasing BLE swelling x "off and on for a while."  Pain score 8/10.  Swelling has increased to the point that blisters are forming on lateral RLE.  Pt reports that she was seen by PCP yesterday for same.  Pt reports that she takes 50mg  Lasix per day.

## 2015-04-11 NOTE — Telephone Encounter (Signed)
Spoke with pt, she stated that swelling and pain, as well as drainage was getting worse. Pt was informed to go get evaluated at the ED.

## 2015-04-12 ENCOUNTER — Inpatient Hospital Stay (HOSPITAL_COMMUNITY): Payer: Medicare Other

## 2015-04-12 ENCOUNTER — Other Ambulatory Visit (HOSPITAL_COMMUNITY): Payer: Medicare Other

## 2015-04-12 LAB — URINALYSIS, ROUTINE W REFLEX MICROSCOPIC
Bilirubin Urine: NEGATIVE
Glucose, UA: NEGATIVE mg/dL
Hgb urine dipstick: NEGATIVE
Ketones, ur: NEGATIVE mg/dL
Leukocytes, UA: NEGATIVE
Nitrite: NEGATIVE
Protein, ur: NEGATIVE mg/dL
Specific Gravity, Urine: 1.02 (ref 1.005–1.030)
pH: 5 (ref 5.0–8.0)

## 2015-04-12 MED ORDER — POTASSIUM CHLORIDE CRYS ER 20 MEQ PO TBCR
40.0000 meq | EXTENDED_RELEASE_TABLET | Freq: Two times a day (BID) | ORAL | Status: DC
  Administered 2015-04-12 – 2015-04-13 (×4): 40 meq via ORAL
  Filled 2015-04-12 (×6): qty 2

## 2015-04-12 MED ORDER — PROMETHAZINE HCL 25 MG/ML IJ SOLN
12.5000 mg | Freq: Four times a day (QID) | INTRAMUSCULAR | Status: DC | PRN
  Administered 2015-04-12: 12.5 mg via INTRAVENOUS
  Filled 2015-04-12: qty 1

## 2015-04-12 MED ORDER — VANCOMYCIN HCL 10 G IV SOLR
2000.0000 mg | Freq: Once | INTRAVENOUS | Status: AC
  Administered 2015-04-12: 2000 mg via INTRAVENOUS
  Filled 2015-04-12: qty 2000

## 2015-04-12 MED ORDER — VANCOMYCIN HCL 10 G IV SOLR
1750.0000 mg | INTRAVENOUS | Status: DC
  Administered 2015-04-13 – 2015-04-15 (×3): 1750 mg via INTRAVENOUS
  Filled 2015-04-12: qty 1000
  Filled 2015-04-12 (×2): qty 1750

## 2015-04-12 MED ORDER — ONDANSETRON HCL 4 MG/2ML IJ SOLN
4.0000 mg | INTRAMUSCULAR | Status: DC | PRN
  Administered 2015-04-12: 4 mg via INTRAVENOUS
  Filled 2015-04-12: qty 2

## 2015-04-12 MED ORDER — ONDANSETRON HCL 4 MG/2ML IJ SOLN
4.0000 mg | Freq: Four times a day (QID) | INTRAMUSCULAR | Status: DC | PRN
  Administered 2015-04-12: 4 mg via INTRAVENOUS
  Filled 2015-04-12: qty 2

## 2015-04-12 NOTE — Progress Notes (Signed)
Rt placed pt on 2 LPM [place] due to low sats .

## 2015-04-12 NOTE — Progress Notes (Addendum)
[facility] PROGRESS NOTE  April [name] [id number] DOB: 11/08/1950 DOA: 04/11/2015 PCP: [name], MD  Assessment/Plan: 1. Acute on chronic R heart failure/edema with cellulitis -continue IV lasix -check ECHO -monitor I/O, weights -was followed by CHF team, hasnt been seen in 2years  2. Nausea/Vomiting -started this am -etiology unclear, downgrade diet to clears, abd exam benign -LFTS WNL, check KUB  3. Morbid Obesity -Almost 400lbs now, today's weights 393 -doesn't recall having OSA, reportedly had  a sleepstudy over 2years ago which may be ok per pt -RD consult  3. Chronic venous stasis, lymphedema changes With blisters/open wound, will ask Wound RN for input  4. DM diet controlled -SSI, last Hb aic ,6  5. Asthma -stable, nebs PRN  6. HTN -stable, on coreg,verapamil, hydralazine  7. Diabetic Neuropathy/B12 defi -on gabapentin  DVT proph: lovenox  Code Status: Full COde Family Communication: none at bedside Disposition Plan: Home when improved    HPI/Subjective: Vomiting x2, no abd pain, some swelling  Objective: Filed Vitals:   04/11/15 2218 04/12/15 0620  BP: 165/80 141/61  Pulse: 77 83  Temp: 98.2 F (36.8 C) 98.5 F (36.9 C)  Resp: 18 18    Intake/Output Summary (Last 24 hours) at 04/12/15 1050 Last data filed at 04/12/15 0848  Gross per 24 hour  Intake      0 ml  Output   1075 ml  Net  -1075 ml   Filed Weights   04/11/15 2208  Weight: 178.6 kg (393 lb 11.9 oz)    Exam:   General:  AAOx3, morbidly obese  Cardiovascular: S1S2/RRR  Respiratory: CTAB  Abdomen: soft, obese, NT, BS present  Musculoskeletal: chronic edema with venous stasis changes, small open bliter, area of erythema and tenderness on RLE   Data Reviewed: Basic Metabolic Panel:  Recent Labs Lab 04/10/15 1505 04/11/15 1606  NA 143 143  K 3.3* 3.6  CL 108 107  CO2 28 29  GLUCOSE 89 91  BUN 18 19  CREATININE 1.01 1.19*  CALCIUM 9.1 8.8*    Liver Function Tests:  Recent Labs Lab 04/10/15 1505 04/11/15 1606  AST 12 16  ALT 9 13*  ALKPHOS 60 68  BILITOT 0.5 0.6  PROT 6.3 7.1  ALBUMIN 3.7 3.7   No results for input(s): LIPASE, AMYLASE in the last 168 hours. No results for input(s): AMMONIA in the last 168 hours. CBC:  Recent Labs Lab 04/11/15 1606  WBC 5.0  HGB 12.1  HCT 39.2  MCV 94.7  PLT 164   Cardiac Enzymes: No results for input(s): CKTOTAL, CKMB, CKMBINDEX, TROPONINI in the last 168 hours. BNP (last 3 results)  Recent Labs  04/11/15 1606  BNP 75.2    ProBNP (last 3 results) No results for input(s): PROBNP in the last 8760 hours.  CBG: No results for input(s): GLUCAP in the last 168 hours.  No results found for this or any previous visit (from the past 240 hour(s)).   Studies: Dg Chest 2 View  04/11/2015  CLINICAL DATA:  Leg edema and pain with swelling. Hypertension diabetes. EXAM: CHEST  2 VIEW COMPARISON:  09/05/2013 FINDINGS: Atherosclerotic aortic arch. Thoracic spondylosis. Mild enlargement of the cardiopericardial silhouette with indistinct upper zone pulmonary vasculature. No overt edema. No blunting of the costophrenic angles. Body habitus reduces diagnostic sensitivity and specificity. IMPRESSION: 1. Mild enlargement of the cardiopericardial silhouette with pulmonary venous hypertension but no overt edema. 2. Thoracic spondylosis. Electronically Signed   By: Cindra Eves.D.  On: 04/11/2015 20:15    Scheduled Meds: . amLODipine  5 mg Oral Daily  . aspirin EC  81 mg Oral Daily  . atorvastatin  20 mg Oral q1800  . carvedilol  12.5 mg Oral BID WC  . clindamycin (CLEOCIN) IV  600 mg Intravenous 3 times per day  . docusate sodium  100 mg Oral BID  . enoxaparin (LOVENOX) injection  80 mg Subcutaneous QHS  . folic acid  [id number] mcg Oral Daily  . furosemide  40 mg Intravenous 3 times per day  . gabapentin  300 mg Oral TID  . hydrALAZINE  50 mg Oral 3 times per day  .  mometasone-formoterol  2 puff Inhalation BID  . multivitamin with minerals  1 tablet Oral Daily  . omega-3 acid ethyl esters  1 g Oral Daily  . pantoprazole  40 mg Oral Daily  . sodium chloride flush  3 mL Intravenous Q12H  . verapamil  360 mg Oral Daily   Continuous Infusions:  Antibiotics Given (last 72 hours)    Date/Time Action Medication Dose Rate   04/11/15 2315 Given   clindamycin (CLEOCIN) IVPB 600 mg 600 mg 100 mL/hr   04/12/15 [id number] Given   clindamycin (CLEOCIN) IVPB 600 mg 600 mg 100 mL/hr      Principal Problem:   Cellulitis of both lower extremities Active Problems:   Essential hypertension   Morbid obesity with BMI of 50.0-59.9, adult (HCC)   Bilateral leg edema   CHF (congestive heart failure) - diastolic vs RHF    Time spent: 18min    April [name]  [facility] Pager [phone]. If 7PM-7AM, please contact night-coverage at [email or web address], password [facility] 04/12/2015, 10:50 AM  LOS: 1 day

## 2015-04-12 NOTE — Plan of Care (Signed)
Problem: Food- and Nutrition-Related Knowledge Deficit (NB-1.1) Goal: Nutrition education Formal process to instruct or train a patient/client in a skill or to impart knowledge to help patients/clients voluntarily manage or modify food choices and eating behavior to maintain or improve health. Outcome: Completed/Met Date Met:  04/12/15 RD consulted for nutrition education regarding heart healthy diet.  Dietetic Intern provided "Heart-Healthy Eating Nutrition Therapy" handout from the Academy [employer].  Discussed limiting saturated fat and sodium, and emphasized heart-healthy fats and dietary fiber intake.  Provided lists of ways to reduce sodium and recommended serving sizes of common foods.   Patient reports that she is currently very nauseated and had emesis x2 this morning.  States that PTA she was consuming 3 meals per day and monitoring her portions closely.  States that her appetite is not what it used to be and that she does not add salt to any of her foods after cooking.  Recommended the use of spices and herb blends for seasoning when cooking.  Encouraged pt to have balanced meals/snacks and consume high-fiber, whole grains products.  Teach back method used.  Expect fair compliance.  Body mass index is 61.8 kg/(m^2).  Pt meets criteria for morbid obesity based on current BMI.  Current diet order is Heart Healthy with thin liquids.  Labs reviewed.  Medications reviewed: lasix, MVI, folic acid, K-Dur.  If additional nutrition issues arise, please re-consult RD.     

## 2015-04-12 NOTE — Progress Notes (Signed)
Pharmacy Antibiotic Note  April [name] is a 65 y.o. female admitted on 04/11/2015 with cellulitis.  Pharmacy has been consulted for vancomycin dosing (from clindamycin). BL LE edema with BL cellulitis per [facility] notes.   SCr up overnight.  PCN allergy noted  Plan:   vancomycin 2gm IV x 1 then 1750mg  IV q24h based on normalized CrCl = 25ml/min and using obesity dosing nomogram  Check steady steady level if remains on vancomycin  Monitor renal function  Height: 5\' 7"  (170.2 cm) Weight: (!) 393 lb 11.9 oz (178.6 kg) IBW/kg (Calculated) : 61.6  Temp (24hrs), Avg:98.5 F (36.9 C), Min:98.2 F (36.8 C), Max:98.8 F (37.1 C)   Recent Labs Lab 04/10/15 1505 04/11/15 1606  WBC  --  5.0  CREATININE 1.01 1.19*    Estimated Creatinine Clearance: 81.7 mL/min (by C-G formula based on Cr of 1.19).    Antimicrobials this admission: 3/9 clinda >> 3/10 3/10 vanco >>   Dose adjustments this admission:  Microbiology results: None  Thank you for allowing pharmacy to be a part of this patient's care.  Doreene Eland, PharmD, BCPS.   Pager: [phone] 04/12/2015 12:01 PM

## 2015-04-12 NOTE — Consult Note (Addendum)
WOC wound consult note Reason for Consult:Consult requested for bilat legs cellulitis with blistering and weeping.  WOC was at the [facility] this am, but is not available at that location this afternoon. Discussed appearance of legs with bedside nurse via phone call. Wound type: Cellulitis to bilat legs with generalized edema and erythremia.  Patchy areas of blistering and partial thickness skin loss where some blisters have peeled, revealing pink moist wounds.  Large amt yellow drainage. Dressing procedure/placement/frequency: Orders provided for bedside nurse tochange dressings to bilat legs Q day as follows: Apply xeroform to weeping/blistered areas of legs, then cover with ABD pads, then kerlex in a spiral fashion, beginning just behind toes to below knees.  Apply Ace wrap in the same spiral fashion for light compression. [facility] team will plan to assess bilat legs on Monday. Julien Girt MSN, RN, [place], [place], [place]

## 2015-04-13 ENCOUNTER — Inpatient Hospital Stay (HOSPITAL_COMMUNITY): Payer: Medicare Other

## 2015-04-13 DIAGNOSIS — I509 Heart failure, unspecified: Secondary | ICD-10-CM

## 2015-04-13 LAB — BASIC METABOLIC PANEL
ANION GAP: 8 (ref 5–15)
BUN: 14 mg/dL (ref 6–20)
CO2: 32 mmol/L (ref 22–32)
Calcium: 8.6 mg/dL — ABNORMAL LOW (ref 8.9–10.3)
Chloride: 104 mmol/L (ref 101–111)
Creatinine, Ser: 1.19 mg/dL — ABNORMAL HIGH (ref 0.44–1.00)
GFR calc Af Amer: 55 mL/min — ABNORMAL LOW (ref >60)
GFR, EST NON AFRICAN AMERICAN: 47 mL/min — AB (ref >60)
GLUCOSE: 131 mg/dL — AB (ref 65–99)
POTASSIUM: 3.8 mmol/L (ref 3.5–5.1)
Sodium: 144 mmol/L (ref 135–145)

## 2015-04-13 LAB — CBC
HEMATOCRIT: 37.8 % (ref 36.0–46.0)
HEMOGLOBIN: 11.1 g/dL — AB (ref 12.0–15.0)
MCH: 28.8 pg (ref 26.0–34.0)
MCHC: 29.4 g/dL — ABNORMAL LOW (ref 30.0–36.0)
MCV: 98.2 fL (ref 78.0–100.0)
Platelets: 181 10*3/uL (ref 150–400)
RBC: 3.85 MIL/uL — AB (ref 3.87–5.11)
RDW: 15.9 % — ABNORMAL HIGH (ref 11.5–15.5)
WBC: 5.7 10*3/uL (ref 4.0–10.5)

## 2015-04-13 LAB — ECHOCARDIOGRAM COMPLETE
Height: 67 in
Weight: 6165.83 oz

## 2015-04-13 NOTE — Progress Notes (Signed)
[facility] PROGRESS NOTE  April [name] [id number] DOB: 03/04/1950 DOA: 04/11/2015 PCP: [name], MD  Assessment/Plan: 1. Acute on chronic R heart failure/edema with cellulitis -continue IV lasix, negative 2.9L -check ECHO -weights 393> 385 -was followed by CHF team, hasnt been seen in 2years  2. Nausea/Vomiting -resolved -etiology unclear, abd exam benign, LFTS WNL, KUB-WNL -advance diet  3. Morbid Obesity -Almost 400lbs on admission -doesn't recall having OSA, reportedly had  a sleepstudy over 2years ago which may be ok per pt -RD consult  3. Chronic venous stasis, lymphedema changes With blisters/open wound, appreciate Wound RN input  4. DM diet controlled -SSI, last Hb aic ,6  5. Asthma -stable, nebs PRN  6. HTN -stable, on coreg,verapamil, hydralazine  7. Diabetic Neuropathy/B12 defi -on gabapentin  DVT proph: lovenox  Code Status: Full COde Family Communication: none at bedside Disposition Plan: Home when improved    HPI/Subjective: No vomiting, feels better, some shakes  Objective: Filed Vitals:   04/12/15 2305 04/13/15 0646  BP:  115/88  Pulse: 91 95  Temp:  99.9 F (37.7 C)  Resp:  18    Intake/Output Summary (Last 24 hours) at 04/13/15 0940 Last data filed at 04/13/15 0906  Gross per 24 hour  Intake   1745 ml  Output   3350 ml  Net  -1605 ml   Filed Weights   04/11/15 2208 04/13/15 0622  Weight: 178.6 kg (393 lb 11.9 oz) 174.8 kg (385 lb 5.8 oz)    Exam:   General:  AAOx3, morbidly obese, no distress  Cardiovascular: S1S2/RRR  Respiratory: CTAB  Abdomen: soft, obese, NT, BS present  Musculoskeletal: chronic edema with venous stasis changes, small open blister, area of erythema and tenderness on RLE-improving, now with dressing   Data Reviewed: Basic Metabolic Panel:  Recent Labs Lab 04/10/15 1505 04/11/15 1606 04/13/15 0508  NA 143 143 144  K 3.3* 3.6 3.8  CL 108 107 104  CO2 28 29 32  GLUCOSE 89 91  131*  BUN 18 19 14   CREATININE 1.01 1.19* 1.19*  CALCIUM 9.1 8.8* 8.6*   Liver Function Tests:  Recent Labs Lab 04/10/15 1505 04/11/15 1606  AST 12 16  ALT 9 13*  ALKPHOS 60 68  BILITOT 0.5 0.6  PROT 6.3 7.1  ALBUMIN 3.7 3.7   No results for input(s): LIPASE, AMYLASE in the last 168 hours. No results for input(s): AMMONIA in the last 168 hours. CBC:  Recent Labs Lab 04/11/15 1606 04/13/15 0508  WBC 5.0 5.7  HGB 12.1 11.1*  HCT 39.2 37.8  MCV 94.7 98.2  PLT 164 181   Cardiac Enzymes: No results for input(s): CKTOTAL, CKMB, CKMBINDEX, TROPONINI in the last 168 hours. BNP (last 3 results)  Recent Labs  04/11/15 1606  BNP 75.2    ProBNP (last 3 results) No results for input(s): PROBNP in the last 8760 hours.  CBG: No results for input(s): GLUCAP in the last 168 hours.  No results found for this or any previous visit (from the past 240 hour(s)).   Studies: Dg Chest 2 View  04/11/2015  CLINICAL DATA:  Leg edema and pain with swelling. Hypertension diabetes. EXAM: CHEST  2 VIEW COMPARISON:  09/05/2013 FINDINGS: Atherosclerotic aortic arch. Thoracic spondylosis. Mild enlargement of the cardiopericardial silhouette with indistinct upper zone pulmonary vasculature. No overt edema. No blunting of the costophrenic angles. Body habitus reduces diagnostic sensitivity and specificity. IMPRESSION: 1. Mild enlargement of the cardiopericardial silhouette with pulmonary venous hypertension but  no overt edema. 2. Thoracic spondylosis. Electronically Signed   By: Van Clines M.D.   On: 04/11/2015 20:15   Dg Abd 1 View  04/12/2015  CLINICAL DATA:  Nausea, vomiting. EXAM: ABDOMEN - 1 VIEW COMPARISON:  None. FINDINGS: The bowel gas pattern is normal. Phleboliths are noted in the pelvis. IMPRESSION: There is no evidence of bowel obstruction or ileus. Electronically Signed   By: Marijo Conception, M.D.   On: 04/12/2015 16:37    Scheduled Meds: . amLODipine  5 mg Oral Daily  .  aspirin EC  81 mg Oral Daily  . atorvastatin  20 mg Oral q1800  . carvedilol  12.5 mg Oral BID WC  . docusate sodium  100 mg Oral BID  . enoxaparin (LOVENOX) injection  80 mg Subcutaneous QHS  . folic acid  [id number] mcg Oral Daily  . furosemide  40 mg Intravenous 3 times per day  . gabapentin  300 mg Oral TID  . hydrALAZINE  50 mg Oral 3 times per day  . mometasone-formoterol  2 puff Inhalation BID  . multivitamin with minerals  1 tablet Oral Daily  . omega-3 acid ethyl esters  1 g Oral Daily  . pantoprazole  40 mg Oral Daily  . potassium chloride  40 mEq Oral BID  . sodium chloride flush  3 mL Intravenous Q12H  . vancomycin  1,750 mg Intravenous Q24H  . verapamil  360 mg Oral Daily   Continuous Infusions:  Antibiotics Given (last 72 hours)    Date/Time Action Medication Dose Rate   04/11/15 2315 Given   clindamycin (CLEOCIN) IVPB 600 mg 600 mg 100 mL/hr   04/12/15 [id number] Given   clindamycin (CLEOCIN) IVPB 600 mg 600 mg 100 mL/hr   04/12/15 1441 Given   vancomycin (VANCOCIN) 2,000 mg in sodium chloride 0.9 % 500 mL IVPB 2,000 mg 250 mL/hr   04/13/15 [id number] Given   vancomycin (VANCOCIN) 1,750 mg in sodium chloride 0.9 % 500 mL IVPB 1,750 mg 250 mL/hr      Principal Problem:   Cellulitis of both lower extremities Active Problems:   Essential hypertension   Morbid obesity with BMI of 50.0-59.9, adult (HCC)   Bilateral leg edema   CHF (congestive heart failure) - diastolic vs RHF    Time spent: 72min    April [name]  [facility] Pager [phone]. If 7PM-7AM, please contact night-coverage at [email or web address], password [facility] 04/13/2015, 9:40 AM  LOS: 2 days

## 2015-04-13 NOTE — Progress Notes (Signed)
  Echocardiogram 2D Echocardiogram has been performed.  Darlina Sicilian M 04/13/2015, 10:51 AM

## 2015-04-14 LAB — BASIC METABOLIC PANEL
ANION GAP: 12 (ref 5–15)
BUN: 20 mg/dL (ref 6–20)
CALCIUM: 8.4 mg/dL — AB (ref 8.9–10.3)
CO2: 28 mmol/L (ref 22–32)
CREATININE: 1.48 mg/dL — AB (ref 0.44–1.00)
Chloride: 101 mmol/L (ref 101–111)
GFR, EST AFRICAN AMERICAN: 42 mL/min — AB (ref >60)
GFR, EST NON AFRICAN AMERICAN: 36 mL/min — AB (ref >60)
Glucose, Bld: 156 mg/dL — ABNORMAL HIGH (ref 65–99)
Potassium: 4.2 mmol/L (ref 3.5–5.1)
Sodium: 141 mmol/L (ref 135–145)

## 2015-04-14 LAB — CBC
HCT: 38.7 % (ref 36.0–46.0)
Hemoglobin: 11.6 g/dL — ABNORMAL LOW (ref 12.0–15.0)
MCH: 28 pg (ref 26.0–34.0)
MCHC: 30 g/dL (ref 30.0–36.0)
MCV: 93.5 fL (ref 78.0–100.0)
PLATELETS: 175 10*3/uL (ref 150–400)
RBC: 4.14 MIL/uL (ref 3.87–5.11)
RDW: 15.3 % (ref 11.5–15.5)
WBC: 7.3 10*3/uL (ref 4.0–10.5)

## 2015-04-14 MED ORDER — FUROSEMIDE 40 MG PO TABS
40.0000 mg | ORAL_TABLET | Freq: Two times a day (BID) | ORAL | Status: DC
  Filled 2015-04-14 (×2): qty 1

## 2015-04-14 NOTE — Progress Notes (Signed)
[facility] PROGRESS NOTE  April [name] [id number] DOB: [birth year] DOA: 04/11/2015 PCP: April Rail, MD  Assessment/Plan: 1. Acute on chronic R heart failure/edema with cellulitis -diuresed with IV lasix, negative 4.4L -2DECHO with normal EF and diastolic dysfunction, PA pressures remarkably normal -weights 393> 385 -was followed by CHF team, hasnt been seen in 2years -feels weak and dizzy this am will cut down lasix and hold this, mild bump in creatinine noted -continue Vancomycin, caution with dosing given AKI  2. Nausea/Vomiting -resolved -etiology unclear, abd exam benign, LFTS WNL, KUB-WNL -tolerating diet  3. Morbid Obesity -Almost 400lbs on admission -doesn't recall having OSA, reportedly had  a sleepstudy over 2years ago which may be ok per pt -RD consult  3. Chronic venous stasis, lymphedema changes With blisters/open wound, appreciate Wound RN input  4. DM diet controlled -SSI, last Hb aic 6  5. Asthma -stable, nebs PRN  6. HTN -stable, on coreg,verapamil, hydralazine  7. Diabetic Neuropathy/B12 defi -on gabapentin  DVT proph: lovenox  Code Status: Full COde Family Communication: none at bedside Disposition Plan: Home when improved    HPI/Subjective: Feels weak, dizzy, L shoulder pain  Objective: Filed Vitals:   04/14/15 0440 04/14/15 0832  BP: 140/60   Pulse: 97 85  Temp: 98 F (36.7 C)   Resp: 16 18    Intake/Output Summary (Last 24 hours) at 04/14/15 1152 Last data filed at 04/14/15 0600  Gross per 24 hour  Intake    160 ml  Output   2000 ml  Net  -1840 ml   Filed Weights   04/11/15 2208 04/13/15 0622 04/14/15 0600  Weight: 178.6 kg (393 lb 11.9 oz) 174.8 kg (385 lb 5.8 oz) 175.6 kg (387 lb 2 oz)    Exam:   General:  AAOx3, morbidly obese, no distress  Cardiovascular: S1S2/RRR  Respiratory: CTAB  Abdomen: soft, obese, NT, BS present  Musculoskeletal: chronic edema with venous stasis changes, small open blister,  area of erythema and tenderness on RLE-improving, now with dressing   Data Reviewed: Basic Metabolic Panel:  Recent Labs Lab 04/10/15 1505 04/11/15 1606 04/13/15 0508 04/14/15 0539  NA 143 143 144 141  K 3.3* 3.6 3.8 4.2  CL 108 107 104 101  CO2 28 29 32 28  GLUCOSE 89 91 131* 156*  BUN 18 19 14 20   CREATININE 1.01 1.19* 1.19* 1.48*  CALCIUM 9.1 8.8* 8.6* 8.4*   Liver Function Tests:  Recent Labs Lab 04/10/15 1505 04/11/15 1606  AST 12 16  ALT 9 13*  ALKPHOS 60 68  BILITOT 0.5 0.6  PROT 6.3 7.1  ALBUMIN 3.7 3.7   No results for input(s): LIPASE, AMYLASE in the last 168 hours. No results for input(s): AMMONIA in the last 168 hours. CBC:  Recent Labs Lab 04/11/15 1606 04/13/15 0508 04/14/15 0539  WBC 5.0 5.7 7.3  HGB 12.1 11.1* 11.6*  HCT 39.2 37.8 38.7  MCV 94.7 98.2 93.5  PLT 164 181 175   Cardiac Enzymes: No results for input(s): CKTOTAL, CKMB, CKMBINDEX, TROPONINI in the last 168 hours. BNP (last 3 results)  Recent Labs  04/11/15 1606  BNP 75.2    ProBNP (last 3 results) No results for input(s): PROBNP in the last 8760 hours.  CBG: No results for input(s): GLUCAP in the last 168 hours.  No results found for this or any previous visit (from the past 240 hour(s)).   Studies: Dg Abd 1 View  04/12/2015  CLINICAL DATA:  Nausea, vomiting.  EXAM: ABDOMEN - 1 VIEW COMPARISON:  None. FINDINGS: The bowel gas pattern is normal. Phleboliths are noted in the pelvis. IMPRESSION: There is no evidence of bowel obstruction or ileus. Electronically Signed   By: April [name], M.D.   On: 04/12/2015 16:37    Scheduled Meds: . amLODipine  5 mg Oral Daily  . aspirin EC  81 mg Oral Daily  . atorvastatin  20 mg Oral q1800  . carvedilol  12.5 mg Oral BID WC  . docusate sodium  100 mg Oral BID  . enoxaparin (LOVENOX) injection  80 mg Subcutaneous QHS  . folic acid  [id number] mcg Oral Daily  . gabapentin  300 mg Oral TID  . hydrALAZINE  50 mg Oral 3 times per day  .  mometasone-formoterol  2 puff Inhalation BID  . multivitamin with minerals  1 tablet Oral Daily  . omega-3 acid ethyl esters  1 g Oral Daily  . pantoprazole  40 mg Oral Daily  . sodium chloride flush  3 mL Intravenous Q12H  . vancomycin  1,750 mg Intravenous Q24H  . verapamil  360 mg Oral Daily   Continuous Infusions:  Antibiotics Given (last 72 hours)    Date/Time Action Medication Dose Rate   04/11/15 2315 Given   clindamycin (CLEOCIN) IVPB 600 mg 600 mg 100 mL/hr   04/12/15 [id number] Given   clindamycin (CLEOCIN) IVPB 600 mg 600 mg 100 mL/hr   04/12/15 1441 Given   vancomycin (VANCOCIN) 2,000 mg in sodium chloride 0.9 % 500 mL IVPB 2,000 mg 250 mL/hr   04/13/15 [id number] Given   vancomycin (VANCOCIN) 1,750 mg in sodium chloride 0.9 % 500 mL IVPB 1,750 mg 250 mL/hr   04/14/15 0600 Given   vancomycin (VANCOCIN) 1,750 mg in sodium chloride 0.9 % 500 mL IVPB 1,750 mg 250 mL/hr      Principal Problem:   Cellulitis of both lower extremities Active Problems:   Essential hypertension   Morbid obesity with BMI of 50.0-59.9, adult (HCC)   Bilateral leg edema   CHF (congestive heart failure) - diastolic vs RHF    Time spent: 91min    April [name]  [facility] Pager [phone]. If 7PM-7AM, please contact night-coverage at [email or web address], password [facility] 04/14/2015, 11:52 AM  LOS: 3 days

## 2015-04-14 NOTE — Progress Notes (Signed)
Pharmacy Antibiotic Note  April [name] is a 65 y.o. female admitted on 04/11/2015 with cellulitis.  Pharmacy has been consulted for vancomycin dosing (from clindamycin). BL LE edema with BL cellulitis per [facility] notes.   SCr up overnight.  PCN allergy noted.  Plan:  Currently on 1750mg  IV q24h based on normalized CrCl = 41ml/min and using obesity dosing nomogram  Due to bump in SCr, will check a trough level prior to next dose, which is tomorrow morning.  This level will be following a 2g loading dose and two 1750 mg maintenance doses.  Vanc trough goal 10-15 mcg/mL.   Height: 5\' 7"  (170.2 cm) Weight: (!) 387 lb 2 oz (175.6 kg) IBW/kg (Calculated) : 61.6  Temp (24hrs), Avg:98.9 F (37.2 C), Min:98 F (36.7 C), Max:99.9 F (37.7 C)   Recent Labs Lab 04/10/15 1505 04/11/15 1606 04/13/15 0508 04/14/15 0539  WBC  --  5.0 5.7 7.3  CREATININE 1.01 1.19* 1.19* 1.48*    Estimated Creatinine Clearance: 65 mL/min (by C-G formula based on Cr of 1.48).    Antimicrobials this admission: 3/9 clinda >> 3/10 3/10 vanco >>   Dose adjustments this admission: 3/13 0500 VT: ____   Microbiology results: None  Thank you for allowing pharmacy to be a part of this patient's care.  Hershal Coria, PharmD, BCPS Pager: [phone] 04/14/2015 10:28 AM

## 2015-04-15 ENCOUNTER — Encounter (HOSPITAL_COMMUNITY): Payer: Self-pay | Admitting: Student

## 2015-04-15 LAB — CBC
HEMATOCRIT: 34.8 % — AB (ref 36.0–46.0)
HEMOGLOBIN: 10.9 g/dL — AB (ref 12.0–15.0)
MCH: 29.1 pg (ref 26.0–34.0)
MCHC: 31.3 g/dL (ref 30.0–36.0)
MCV: 93 fL (ref 78.0–100.0)
Platelets: 140 10*3/uL — ABNORMAL LOW (ref 150–400)
RBC: 3.74 MIL/uL — ABNORMAL LOW (ref 3.87–5.11)
RDW: 15.2 % (ref 11.5–15.5)
WBC: 5.9 10*3/uL (ref 4.0–10.5)

## 2015-04-15 LAB — VANCOMYCIN, TROUGH: Vancomycin Tr: 19 ug/mL (ref 10.0–20.0)

## 2015-04-15 LAB — BASIC METABOLIC PANEL
ANION GAP: 7 (ref 5–15)
BUN: 26 mg/dL — ABNORMAL HIGH (ref 6–20)
CHLORIDE: 100 mmol/L — AB (ref 101–111)
CO2: 31 mmol/L (ref 22–32)
Calcium: 8.4 mg/dL — ABNORMAL LOW (ref 8.9–10.3)
Creatinine, Ser: 1.36 mg/dL — ABNORMAL HIGH (ref 0.44–1.00)
GFR calc non Af Amer: 40 mL/min — ABNORMAL LOW (ref >60)
GFR, EST AFRICAN AMERICAN: 47 mL/min — AB (ref >60)
GLUCOSE: 135 mg/dL — AB (ref 65–99)
POTASSIUM: 3.8 mmol/L (ref 3.5–5.1)
Sodium: 138 mmol/L (ref 135–145)

## 2015-04-15 MED ORDER — POTASSIUM CHLORIDE CRYS ER 20 MEQ PO TBCR
40.0000 meq | EXTENDED_RELEASE_TABLET | Freq: Two times a day (BID) | ORAL | Status: DC
  Administered 2015-04-15 – 2015-04-16 (×3): 40 meq via ORAL
  Filled 2015-04-15 (×4): qty 2

## 2015-04-15 MED ORDER — DOXYCYCLINE HYCLATE 100 MG PO TABS
100.0000 mg | ORAL_TABLET | Freq: Two times a day (BID) | ORAL | Status: DC
  Administered 2015-04-15 – 2015-04-16 (×3): 100 mg via ORAL
  Filled 2015-04-15 (×4): qty 1

## 2015-04-15 MED ORDER — FUROSEMIDE 40 MG PO TABS
40.0000 mg | ORAL_TABLET | Freq: Two times a day (BID) | ORAL | Status: DC
  Administered 2015-04-15 – 2015-04-16 (×3): 40 mg via ORAL
  Filled 2015-04-15 (×5): qty 1

## 2015-04-15 MED ORDER — MENTHOL 3 MG MT LOZG
1.0000 | LOZENGE | OROMUCOSAL | Status: DC | PRN
  Administered 2015-04-15 (×2): 3 mg via ORAL
  Filled 2015-04-15: qty 9

## 2015-04-15 NOTE — Evaluation (Signed)
Physical Therapy Evaluation Patient Details Name: April [name] MRN: [id number] DOB: 11/14/1950 Today's Date: 04/15/2015   History of Present Illness  65 y.o. female with h/o CAD, DJD, DM, asthma admitted with BLE edema. Dx: CHF, cellulitis BLEs.  Clinical Impression  Pt admitted with above diagnosis. Pt currently with functional limitations due to the deficits listed below (see PT Problem List). +2 assist for supine to sit and for transfers. Pt ambulated 10' with RW, distance limited by fatigue. SaO2 80% on RA at rest, 87% on 2L O2 walking. ST-SNF recommended as pt doesn't have 24* assistance available at home and requires assist for mobility.  Pt will benefit from skilled PT to increase their independence and safety with mobility to allow discharge to the venue listed below.       Follow Up Recommendations SNF (assist for mobility)    Equipment Recommendations  None recommended by PT    Recommendations for Other Services OT consult     Precautions / Restrictions Precautions Precautions: Fall Precaution Comments: pt reports 1 fall in past year in June 2016 Restrictions Weight Bearing Restrictions: No      Mobility  Bed Mobility Overal bed mobility: Needs Assistance Bed Mobility: Rolling;Sidelying to Sit Rolling: +2 for physical assistance;Mod assist Sidelying to sit: Min assist       General bed mobility comments: mod A to roll to R side, VCs for technique  Transfers Overall transfer level: Needs assistance Equipment used: Rolling walker (2 wheeled) Transfers: Sit to/from Stand Sit to Stand: +2 safety/equipment;Mod assist;+2 physical assistance         General transfer comment: VCs hand placement; +2 assist to rise  Ambulation/Gait Ambulation/Gait assistance: +2 safety/equipment;Min guard Ambulation Distance (Feet): 10 Feet Assistive device: Rolling walker (2 wheeled) Gait Pattern/deviations: Step-to pattern;Decreased step length - right;Decreased step length -  left     General Gait Details: distance limited by fatigue, SaO2 87% on 2L O2 ambulating, +2 safety  Stairs            Wheelchair Mobility    Modified Rankin (Stroke Patients Only)       Balance Overall balance assessment: History of Falls;Needs assistance   Sitting balance-Leahy Scale: Fair       Standing balance-Leahy Scale: Poor                               Pertinent Vitals/Pain Pain Assessment: 0-10 Pain Score: 8  Pain Location: BLEs Pain Descriptors / Indicators: Sore Pain Intervention(s): Premedicated before session;Monitored during session;Limited activity within patient's tolerance    Home Living Family/patient expects to be discharged to:: Private residence Living Arrangements: Spouse/significant other;Children Available Help at Discharge: Available PRN/intermittently   Home Access: Stairs to enter   Entrance Stairs-Number of Steps: 2 Home Layout: Two level;Able to live on main level with bedroom/bathroom Home Equipment: Hospital bed;Walker - 4 wheels;Bedside commode;Wheelchair - Education officer, community - power;Cane - single point Additional Comments: uses RW at home; lives with husband and son who both work, cant get up stairs to 2nd floor, uses hosptial bed, needs assistance for stairs to enter home, shower is upstairs, has been sponge bathing, hasn't gone to 2nd floor in months    Prior Function Level of Independence: Needs assistance   Gait / Transfers Assistance Needed: assist for stairs, walks independently with RW short distances, uses WC for long distances           Hand Dominance  Extremity/Trunk Assessment   Upper Extremity Assessment: Defer to OT evaluation           Lower Extremity Assessment: RLE deficits/detail;LLE deficits/detail RLE Deficits / Details: knee extension 2/5, knee ext PROM WNL BLEs, sensation decr to light touch B feet LLE Deficits / Details: knee extension +2/5  Cervical / Trunk Assessment:  Normal  Communication   Communication: No difficulties  Cognition Arousal/Alertness: Awake/alert Behavior During Therapy: WFL for tasks assessed/performed Overall Cognitive Status: Within Functional Limits for tasks assessed                      General Comments      Exercises        Assessment/Plan    PT Assessment Patient needs continued PT services  PT Diagnosis Difficulty walking;Generalized weakness;Acute pain   PT Problem List Decreased strength;Decreased activity tolerance;Decreased balance;Pain;Decreased mobility;Obesity  PT Treatment Interventions Gait training;DME instruction;Stair training;Functional mobility training;Therapeutic activities;Patient/family education;Balance training;Therapeutic exercise   PT Goals (Current goals can be found in the Care Plan section) Acute Rehab PT Goals Patient Stated Goal: to be able to walk more PT Goal Formulation: With patient Time For Goal Achievement: 04/29/15 Potential to Achieve Goals: Good    Frequency Min 3X/week   Barriers to discharge Decreased caregiver support husband works days    Co-evaluation               End of Session Equipment Utilized During Treatment: Gait belt Activity Tolerance: Patient limited by fatigue Patient left: in chair;with call bell/phone within reach;with chair alarm set Nurse Communication: Mobility status         Time: [phone] PT Time Calculation (min) (ACUTE ONLY): 33 min   Charges:   PT Evaluation $PT Eval Moderate Complexity: 1 Procedure PT Treatments $Gait Training: 8-22 mins   PT G Codes:        Philomena Doheny 04/15/2015, 10:46 AM  [phone]

## 2015-04-15 NOTE — Progress Notes (Signed)
SATURATION QUALIFICATIONS: (This note is used to comply with regulatory documentation for home oxygen)  Patient Saturations on Room Air at Rest = 80%  Patient Saturations on Room Air while Ambulating = 80%  Patient Saturations on 2 Liters of oxygen while Ambulating = 87%  Please briefly explain why patient needs home oxygen:to maintain appropriate SaO2 levels.   Blondell Reveal Kistler PT 04/15/2015  [phone]

## 2015-04-15 NOTE — Care Management Note (Signed)
Case Management Note  Patient Details  Name: April [name] MRN: [id number] Date of Birth: Jun 03, 1950  Subjective/Objective:     Admitted with Acute on chronic R heart failure/edema with cellulitis               Action/Plan: Discharge planning per CSW. Discussed d/c options with patient, she is agreeable to d/c to SNF for rehab and continued care for LE cellulitis  Expected Discharge Date:   (unknown)               Expected Discharge Plan:  [place]  In-House Referral:  Clinical Social Work  Discharge planning Services  CM Consult  Post Acute Care Choice:  NA Choice offered to:  NA  DME Arranged:  N/A DME Agency:  NA  HH Arranged:  NA HH Agency:  NA  Status of Service:  Completed, signed off  Medicare Important Message Given:    Date Medicare IM Given:    Medicare IM give by:    Date Additional Medicare IM Given:    Additional Medicare Important Message give by:     If discussed at [place] of Stay Meetings, dates discussed:    Additional Comments:  Guadalupe Maple, RN 04/15/2015, 10:07 AM [phone]

## 2015-04-15 NOTE — Care Management Important Message (Signed)
Important Message  Patient Details  Name: STEFANY MENDIOLA MRN: [id number] Date of Birth: Jun 24, 1950   Medicare Important Message Given:  Yes    Camillo Flaming 04/15/2015, 10:36 AMImportant Message  Patient Details  Name: AVERYL ZERINGUE MRN: [id number] Date of Birth: 09/29/1950   Medicare Important Message Given:  Yes    Camillo Flaming 04/15/2015, 10:35 AM

## 2015-04-15 NOTE — Progress Notes (Signed)
[facility] PROGRESS NOTE  April [name] [id number] DOB: 1950-09-12 DOA: 04/11/2015 PCP: [name], MD  Assessment/Plan: 1. Acute on chronic R heart failure/edema with cellulitis -diuresed with IV lasix, negative 4.4L -2DECHO with normal EF and diastolic dysfunction, PA pressures remarkably normal -weights 393> 385>387 -was followed by CHF team, hasnt been seen in 2years -i cut down lasix 3/12 due to weakness/dizziness and mild bump in creatinine, will resume Po lasix 40mg  BID -stop Vancomycin, change to Doxycycline  2. Nausea/Vomiting -resolved -etiology unclear, abd exam benign, LFTS WNL, KUB-WNL -tolerating diet  3. Morbid Obesity/OHS -Almost 400lbs on admission -doesn't recall having OSA, reportedly had  a sleepstudy over 2years ago which may be ok per pt -RD consult  3. Chronic venous stasis, lymphedema changes With blisters/open wound, appreciate Wound RN input  4. DM diet controlled -SSI, last Hb aic 6  5. Asthma -stable, nebs PRN  6. HTN -stable, on coreg,verapamil, hydralazine  7. Diabetic Neuropathy/B12 defi -on gabapentin  DVT proph: lovenox  Code Status: Full COde Family Communication: none at bedside Disposition Plan: SNF tomorrow if stable    HPI/Subjective: Feels weak, dizzy, L shoulder pain  Objective: Filed Vitals:   04/15/15 0826 04/15/15 1000  BP: 136/72 109/57  Pulse: 90 84  Temp:  99.3 F (37.4 C)  Resp:  16    Intake/Output Summary (Last 24 hours) at 04/15/15 1324 Last data filed at 04/14/15 2300  Gross per 24 hour  Intake    520 ml  Output    600 ml  Net    -80 ml   Filed Weights   04/13/15 0622 04/14/15 0600 04/15/15 0537  Weight: 174.8 kg (385 lb 5.8 oz) 175.6 kg (387 lb 2 oz) 175.6 kg (387 lb 2 oz)    Exam:   General:  AAOx3, morbidly obese, no distress  Cardiovascular: S1S2/RRR  Respiratory: CTAB  Abdomen: soft, obese, NT, BS present  Musculoskeletal: chronic edema with venous stasis changes,  small open blister, area of erythema and tenderness on RLE-improving, now with dressing   Data Reviewed: Basic Metabolic Panel:  Recent Labs Lab 04/10/15 1505 04/11/15 1606 04/13/15 0508 04/14/15 0539 04/15/15 0415  NA 143 143 144 141 138  K 3.3* 3.6 3.8 4.2 3.8  CL 108 107 104 101 100*  CO2 28 29 32 28 31  GLUCOSE 89 91 131* 156* 135*  BUN 18 19 14 20  26*  CREATININE 1.01 1.19* 1.19* 1.48* 1.36*  CALCIUM 9.1 8.8* 8.6* 8.4* 8.4*   Liver Function Tests:  Recent Labs Lab 04/10/15 1505 04/11/15 1606  AST 12 16  ALT 9 13*  ALKPHOS 60 68  BILITOT 0.5 0.6  PROT 6.3 7.1  ALBUMIN 3.7 3.7   No results for input(s): LIPASE, AMYLASE in the last 168 hours. No results for input(s): AMMONIA in the last 168 hours. CBC:  Recent Labs Lab 04/11/15 1606 04/13/15 0508 04/14/15 0539 04/15/15 0415  WBC 5.0 5.7 7.3 5.9  HGB 12.1 11.1* 11.6* 10.9*  HCT 39.2 37.8 38.7 34.8*  MCV 94.7 98.2 93.5 93.0  PLT 164 181 175 140*   Cardiac Enzymes: No results for input(s): CKTOTAL, CKMB, CKMBINDEX, TROPONINI in the last 168 hours. BNP (last 3 results)  Recent Labs  04/11/15 1606  BNP 75.2    ProBNP (last 3 results) No results for input(s): PROBNP in the last 8760 hours.  CBG: No results for input(s): GLUCAP in the last 168 hours.  No results found for this or any previous visit (  from the past 240 hour(s)).   Studies: No results found.  Scheduled Meds: . amLODipine  5 mg Oral Daily  . aspirin EC  81 mg Oral Daily  . atorvastatin  20 mg Oral q1800  . carvedilol  12.5 mg Oral BID WC  . docusate sodium  100 mg Oral BID  . doxycycline  100 mg Oral Q12H  . enoxaparin (LOVENOX) injection  80 mg Subcutaneous QHS  . folic acid  [id number] mcg Oral Daily  . furosemide  40 mg Oral BID  . gabapentin  300 mg Oral TID  . hydrALAZINE  50 mg Oral 3 times per day  . mometasone-formoterol  2 puff Inhalation BID  . multivitamin with minerals  1 tablet Oral Daily  . omega-3 acid ethyl esters   1 g Oral Daily  . pantoprazole  40 mg Oral Daily  . sodium chloride flush  3 mL Intravenous Q12H  . verapamil  360 mg Oral Daily   Continuous Infusions:  Antibiotics Given (last 72 hours)    Date/Time Action Medication Dose Rate   04/12/15 1441 Given   vancomycin (VANCOCIN) 2,000 mg in sodium chloride 0.9 % 500 mL IVPB 2,000 mg 250 mL/hr   04/13/15 [id number] Given   vancomycin (VANCOCIN) 1,750 mg in sodium chloride 0.9 % 500 mL IVPB 1,750 mg 250 mL/hr   04/14/15 0600 Given   vancomycin (VANCOCIN) 1,750 mg in sodium chloride 0.9 % 500 mL IVPB 1,750 mg 250 mL/hr   04/15/15 0542 Given   vancomycin (VANCOCIN) 1,750 mg in sodium chloride 0.9 % 500 mL IVPB 1,750 mg 250 mL/hr   04/15/15 1018 Given   doxycycline (VIBRA-TABS) tablet 100 mg 100 mg       Principal Problem:   Cellulitis of both lower extremities Active Problems:   Essential hypertension   Morbid obesity with BMI of 50.0-59.9, adult (HCC)   Bilateral leg edema   CHF (congestive heart failure) - diastolic vs RHF    Time spent: 9min    [name]  [facility] Pager [phone]. If 7PM-7AM, please contact night-coverage at [email or web address], password [facility] 04/15/2015, 1:24 PM  LOS: 4 days

## 2015-04-15 NOTE — Progress Notes (Signed)
OT Cancellation Note  Patient Details Name: April [name] MRN: [id number] DOB: Dec 30, 1950   Cancelled Treatment:    Reason Eval/Treat Not Completed: Fatigue/lethargy limiting ability to participate Pt back in bed from being up. Will recheck on pt later in day or next day.    Betsy Pries 04/15/2015, 1:15 PM

## 2015-04-15 NOTE — Consult Note (Signed)
WOC wound consult note Reason for Consult:Bilateral edema, consistent with lymphedema to bilateral lower legs.  States she does not wear compression at home and would not be able to apply it independently.  Wound type:Weeping lesions to bilateral lower legs, generalized edema from thighs to feet Pressure Ulcer POA: N/A Measurement:Right posterior and medial calf 8 cm x 4 cm x 0.1 cm denuded weeping skin.  Left lateral calf 4 cm x 2 cm erythema weeping Wound [id number] moist and weeping serous exudate Drainage (amount, consistency, odor) Moderate serous weeping Periwound:Edematous and erythema Dressing procedure/placement/frequency:Continue orders given Friday.  Xeroform, kerlix and ace wrap.  Will need HH after discharge to maintain compression.  Will not follow at this time.  Please re-consult if needed.  Domenic Moras RN BSN [place] Pager [phone]

## 2015-04-15 NOTE — NC FL2 (Signed)
[place] MEDICAID FL2 LEVEL OF CARE SCREENING TOOL     IDENTIFICATION  Patient Name: April [name] Birthdate: 1950-12-19 Sex: female Admission Date (Current Location): 04/11/2015  [facility] and [place] Number:  Herbalist and Address:  [facility],  [place] [address] [place]      Provider Number: [id number]  Attending Physician Name and Address:  Domenic Polite, MD  Relative Name and Phone Number:       Current Level of Care: SNF Recommended Level of Care: [place] Prior Approval Number:    Date Approved/Denied:   PASRR Number: [id number]  Discharge Plan: SNF    Current Diagnoses: Patient Active Problem List   Diagnosis Date Noted  . Bilateral leg edema 04/11/2015  . Cellulitis of both lower extremities 04/11/2015  . CHF (congestive heart failure) - diastolic vs RHF [id number]  . Abnormality of gait 01/03/2015  . Hereditary and idiopathic peripheral neuropathy 01/03/2015  . GERD (gastroesophageal reflux disease) 06/17/2014  . Morbid obesity with BMI of 50.0-59.9, adult ([place]) 09/05/2013  . Other abnormal glucose 08/05/2013  . Hx of colonic polyps 12/15/2012  . Vitamin B12 deficiency 11/03/2012  . Intrinsic asthma 03/23/2012  . Chronic diastolic heart failure ([place]) 02/20/2011  . OSA (obstructive sleep apnea) 09/17/2010  . URINARY URGENCY 01/08/2010  . CAD, NATIVE VESSEL 11/20/2008  . OSTEOARTHRITIS, KNEES, BILATERAL, SEVERE 06/14/2008  . Metabolic syndrome [id number]  . Hyperlipidemia 05/10/2007  . Essential hypertension 01/18/2007  . HYPOKALEMIA 04/30/2006  . HX, PERSONAL, PEPTIC ULCER DISEASE 04/30/2006    Orientation RESPIRATION BLADDER Height & Weight     Self, Time, Situation, Place  O2 (2L) Continent Weight: (!) 387 lb 2 oz (175.6 kg) Height:  5\' 7"  (170.2 cm)  BEHAVIORAL SYMPTOMS/MOOD NEUROLOGICAL BOWEL NUTRITION STATUS   (no behaviors)  (NONE) Continent Diet (Diet Heart)  AMBULATORY STATUS  COMMUNICATION OF NEEDS Skin   Extensive Assist Verbally Other (Comment) (Bilateral edema, consistent with lymphedema to bilateral lower legs.See additional comments for treatment)                       Personal Care Assistance Level of Assistance  Bathing, Feeding, Dressing Bathing Assistance: Maximum assistance Feeding assistance: Limited assistance Dressing Assistance: Maximum assistance     Functional Limitations Info  Sight, Hearing, Speech Sight Info: Adequate Hearing Info: Adequate Speech Info: Adequate    SPECIAL CARE FACTORS FREQUENCY  PT (By licensed PT), OT (By licensed OT)     PT Frequency: 5 x a week OT Frequency: 5 x a week            Contractures      Additional Factors Info  Code Status, Allergies Code Status Info: FULL code status Allergies Info: Penicillins, Benazepril Hcl, Hydrochlorothiazide W-triamterene, Metronidazole, Spironolactone, Sulfonamide Derivatives, Torsemide, Valsartan           Current Medications (04/15/2015):  This is the current hospital active medication list Current Facility-Administered Medications  Medication Dose Route Frequency Provider Last Rate Last Dose  . 0.9 %  sodium chloride infusion  250 mL Intravenous PRN [name], MD      . acetaminophen (TYLENOL) tablet 650 mg  650 mg Oral Q6H PRN [name], MD   650 mg at 04/13/15 1053   Or  . acetaminophen (TYLENOL) suppository 650 mg  650 mg Rectal Q6H PRN [name], MD      . amLODipine (NORVASC) tablet 5 mg  5 mg Oral Daily [name], MD  5 mg at 04/15/15 1019  . aspirin EC tablet 81 mg  81 mg Oral Daily [name], MD   81 mg at 04/15/15 1018  . atorvastatin (LIPITOR) tablet 20 mg  20 mg Oral q1800 [name], MD   20 mg at 04/14/15 1718  . bisacodyl (DULCOLAX) suppository 10 mg  10 mg Rectal Daily PRN [name], MD      . carvedilol (COREG) tablet 12.5 mg  12.5 mg Oral BID WC [name], MD   12.5 mg at 04/15/15 0829  . docusate  sodium (COLACE) capsule 100 mg  100 mg Oral BID [name], MD   100 mg at 04/15/15 1018  . doxycycline (VIBRA-TABS) tablet 100 mg  100 mg Oral Q12H Domenic Polite, MD   100 mg at 04/15/15 1018  . enoxaparin (LOVENOX) injection 80 mg  80 mg Subcutaneous QHS [name], MD   80 mg at 04/14/15 2156  . folic acid (FOLVITE) tablet 0.5 mg  500 mcg Oral Daily [name], MD   0.5 mg at 04/15/15 1019  . furosemide (LASIX) tablet 40 mg  40 mg Oral BID Domenic Polite, MD   40 mg at 04/15/15 1018  . gabapentin (NEURONTIN) capsule 300 mg  300 mg Oral TID [name], MD   300 mg at 04/15/15 1019  . hydrALAZINE (APRESOLINE) tablet 50 mg  50 mg Oral 3 times per day [name], MD   50 mg at 04/15/15 0542  . HYDROcodone-acetaminophen (NORCO/VICODIN) 5-325 MG per tablet 1-2 tablet  1-2 tablet Oral Q4H PRN [name], MD   2 tablet at 04/15/15 0542  . menthol-cetylpyridinium (CEPACOL) lozenge 3 mg  1 lozenge Oral PRN Domenic Polite, MD   3 mg at 04/15/15 1029  . mometasone-formoterol (DULERA) 200-5 MCG/ACT inhaler 2 puff  2 puff Inhalation BID [name], MD   2 puff at 04/15/15 1021  . multivitamin with minerals tablet 1 tablet  1 tablet Oral Daily [name], MD   1 tablet at 04/15/15 1018  . omega-3 acid ethyl esters (LOVAZA) capsule 1 g  1 g Oral Daily [name], MD   1 g at 04/15/15 1019  . ondansetron (ZOFRAN) injection 4 mg  4 mg Intravenous Q4H PRN Domenic Polite, MD   4 mg at 04/12/15 1351  . pantoprazole (PROTONIX) EC tablet 40 mg  40 mg Oral Daily [name], MD   40 mg at 04/15/15 1019  . promethazine (PHENERGAN) injection 12.5 mg  12.5 mg Intravenous Q6H PRN Domenic Polite, MD   12.5 mg at 04/12/15 1606  . sodium chloride flush (NS) 0.9 % injection 3 mL  3 mL Intravenous Q12H [name], MD   3 mL at 04/13/15 2200  . sodium chloride flush (NS) 0.9 % injection 3 mL  3 mL Intravenous PRN [name], MD      . verapamil (CALAN-SR) CR tablet 360 mg  360 mg  Oral Daily [name], MD   360 mg at 04/15/15 1018     Discharge Medications: Please see discharge summary for a list of discharge medications.  Relevant Imaging Results:  Relevant Lab Results:   Additional Information SSN: Bedside Nurse: Please change dressings to bilat legs Q day as follows: Apply xeroform to weeping/blistered areas of legs, then cover with ABD pads, then kerlex in a spiral fashion, beginning just behind toes to below knees. Apply Ace wrap in the same spiral fashion for light compression.  KIDD, SUZANNA A, LCSW

## 2015-04-16 ENCOUNTER — Inpatient Hospital Stay (HOSPITAL_COMMUNITY): Payer: Medicare Other

## 2015-04-16 DIAGNOSIS — J9601 Acute respiratory failure with hypoxia: Secondary | ICD-10-CM

## 2015-04-16 DIAGNOSIS — I10 Essential (primary) hypertension: Secondary | ICD-10-CM

## 2015-04-16 DIAGNOSIS — L03116 Cellulitis of left lower limb: Secondary | ICD-10-CM

## 2015-04-16 DIAGNOSIS — G4733 Obstructive sleep apnea (adult) (pediatric): Secondary | ICD-10-CM

## 2015-04-16 DIAGNOSIS — Z6841 Body Mass Index (BMI) 40.0 and over, adult: Secondary | ICD-10-CM

## 2015-04-16 DIAGNOSIS — I5032 Chronic diastolic (congestive) heart failure: Principal | ICD-10-CM

## 2015-04-16 DIAGNOSIS — R600 Localized edema: Secondary | ICD-10-CM

## 2015-04-16 DIAGNOSIS — L03115 Cellulitis of right lower limb: Secondary | ICD-10-CM

## 2015-04-16 LAB — RAPID STREP SCREEN (MED CTR MEBANE ONLY): Streptococcus, Group A Screen (Direct): NEGATIVE

## 2015-04-16 LAB — BASIC METABOLIC PANEL
Anion gap: 8 (ref 5–15)
BUN: 21 mg/dL — ABNORMAL HIGH (ref 6–20)
CHLORIDE: 100 mmol/L — AB (ref 101–111)
CO2: 32 mmol/L (ref 22–32)
Calcium: 8.5 mg/dL — ABNORMAL LOW (ref 8.9–10.3)
Creatinine, Ser: 1.12 mg/dL — ABNORMAL HIGH (ref 0.44–1.00)
GFR calc non Af Amer: 51 mL/min — ABNORMAL LOW (ref >60)
GFR, EST AFRICAN AMERICAN: 59 mL/min — AB (ref >60)
Glucose, Bld: 103 mg/dL — ABNORMAL HIGH (ref 65–99)
POTASSIUM: 4 mmol/L (ref 3.5–5.1)
SODIUM: 140 mmol/L (ref 135–145)

## 2015-04-16 LAB — CBC
HEMATOCRIT: 33.9 % — AB (ref 36.0–46.0)
HEMOGLOBIN: 10.3 g/dL — AB (ref 12.0–15.0)
MCH: 28.2 pg (ref 26.0–34.0)
MCHC: 30.4 g/dL (ref 30.0–36.0)
MCV: 92.9 fL (ref 78.0–100.0)
Platelets: 153 10*3/uL (ref 150–400)
RBC: 3.65 MIL/uL — AB (ref 3.87–5.11)
RDW: 14.9 % (ref 11.5–15.5)
WBC: 6.7 10*3/uL (ref 4.0–10.5)

## 2015-04-16 MED ORDER — FUROSEMIDE 40 MG PO TABS: ORAL_TABLET | ORAL | Status: DC

## 2015-04-16 MED ORDER — BISACODYL 10 MG RE SUPP: 10.0000 mg | Freq: Every day | RECTAL | Status: DC | PRN

## 2015-04-16 MED ORDER — FUROSEMIDE 40 MG PO TABS: 40.0000 mg | ORAL_TABLET | Freq: Every day | ORAL | Status: DC | PRN

## 2015-04-16 MED ORDER — POTASSIUM CHLORIDE CRYS ER 20 MEQ PO TBCR: 40.0000 meq | EXTENDED_RELEASE_TABLET | Freq: Every day | ORAL | Status: DC | PRN

## 2015-04-16 MED ORDER — VERAPAMIL HCL ER 120 MG PO CP24: 120.0000 mg | ORAL_CAPSULE | Freq: Every day | ORAL | Status: DC

## 2015-04-16 MED ORDER — CARVEDILOL 25 MG PO TABS
12.5000 mg | ORAL_TABLET | Freq: Two times a day (BID) | ORAL | Status: DC
Start: 2015-04-16 — End: 2015-04-18

## 2015-04-16 NOTE — Discharge Summary (Signed)
Physician Discharge Summary  April [name] [id number] DOB: November 13, 1950 DOA: 04/11/2015  PCP: [name], MD  Admit date: 04/11/2015 Discharge date: 04/16/2015  Time spent: 60 minutes  Recommendations for Outpatient Follow-up:  1. Daily weights 2. Lasix PRN only- Bmet in 1 wk 3. Ace wraps to legs- teach patient and husband  4. Follow pulse ox closely and wean O2 as able 5. PCP to consider stopping Verapamil if Pedal edema continues to be an issue- I have stopped Norvasc 6. Follow BP and Titrate up B Blocker or Hydralazine 7. - strict low sodium diet  Discharge Condition:stable    Discharge Diagnoses:  Principal Problem:   Bilateral leg edema Active Problems:   Essential hypertension   OSA (obstructive sleep apnea)   Chronic diastolic heart failure (HCC)   Morbid obesity with BMI of 50.0-59.9, adult (HCC) Hypoxic respiratory failure- suspect chronic    History of present illness  April [name] is a 65 y.o. female with history of CAD, DJD, DM, HTN, HL, obesity, PUD, asthma and anemia who presented to ED with bilat LE swelling and leg pain 8/10. Swelling accompanied by blister formation on R leg. Seen by PCP yesterday. Taking lasix 40-60 mg per day. She says it's "just not working anymore". She has pain in both legs and difficulty walking. Symptoms worse over last few weeks.   Hospital course: Bilateral Pedal edema- chronic diastolic CHF - ECHO report noted below- only has Grade 1 diastolic dysfunction and no right heart failure- did not have pulm edema on CXR on admission therefore suspect pedal edema is likely venous stasis or lymphedema -blisters healing - diuresed with Lasix- negative balance 4700 cc - will change to PRN Lasix only-- I have told her that her focus should be to keep legs elevated and  in ACE wraps when in dependant position - follow daily weight and pedal edema closely at SNF  Bilateral leg cellulitis? - erythema may have been secondary to edema- no  cellulitis at this time- finished 5 days of antibiotics  AKI - due to diuresis - LASIX PRN only - repeat Bmet in 1 wk at nursing facility   HTN - cont coreg, hydralazine at home doses - d/c Norvasc today (home med) as she is already on Verapamil and this could be adding to pedal edema - have cut down dose of Verapamil from 360 to 120 to see if this reduces her edema issues  Hypoxic resp failure - h/o asthma  - currently not wheezing - pulse ox 86% on room air- requiring 2 L O2 to keep pulse ox > 90 at rest - CXR today still without pulm edema - possibly due to obesity hypoventilation and/or atelectasis - follow pulse ox at nursing home  Nausea/Vomiting -resolved -etiology unclear, abd exam benign, LFTS WNL, KUB-WNL -tolerating diet  DM diet controlled - last Hb aic 6    Procedures: ECHO Study Conclusions  - Left ventricle: The cavity size was normal. There was mild  concentric hypertrophy. Systolic function was vigorous. The  estimated ejection fraction was in the range of 65% to 70%. Wall  motion was normal; there were no regional wall motion  abnormalities. Doppler parameters are consistent with abnormal  left ventricular relaxation (grade 1 diastolic dysfunction).  Doppler parameters are consistent with elevated ventricular  end-diastolic filling pressure. - Aortic valve: Trileaflet; normal thickness leaflets. There was no  regurgitation. - Aortic root: The aortic root was normal in size. - Mitral valve: Structurally normal valve. - Left  atrium: The atrium was mildly dilated. - Right ventricle: The cavity size was normal. Wall thickness was  normal. Systolic function was normal. - Right atrium: The atrium was normal in size. - Tricuspid valve: There was mild regurgitation. - Pulmonary arteries: Systolic pressure was within the normal  range. - Inferior vena cava: The vessel was normal in size. - Pericardium, extracardiac: There was no pericardial  effusion.  Consultations:    Discharge Exam: Filed Weights   04/14/15 0600 04/15/15 0537 04/16/15 0300  Weight: 175.6 kg (387 lb 2 oz) 175.6 kg (387 lb 2 oz) 175.5 kg (386 lb 14.5 oz)   Filed Vitals:   04/16/15 1026 04/16/15 1028  BP: 127/64 127/64  Pulse:    Temp:    Resp:      General: AAO x 3, no distress, morbidly obese Cardiovascular: RRR, no murmurs - ACE wraps opened- no edema in legs Respiratory: clear to auscultation bilaterally GI: soft, non-tender, non-distended, bowel sound positive Skin: small skin tear on right leg- no blisters  Discharge Instructions You were cared for by a hospitalist during your hospital stay. If you have any questions about your discharge medications or the care you received while you were in the hospital after you are discharged, you can call the unit and asked to speak with the hospitalist on call if the hospitalist that took care of you is not available. Once you are discharged, your primary care physician will handle any further medical issues. Please note that NO REFILLS for any discharge medications will be authorized once you are discharged, as it is imperative that you return to your primary care physician (or establish a relationship with a primary care physician if you do not have one) for your aftercare needs so that they can reassess your need for medications and monitor your lab values.      Discharge Instructions    Discharge instructions    Complete by:  As directed   Low sodium, low carb, low fat diet- high protein     Increase activity slowly    Complete by:  As directed             Medication List    STOP taking these medications        amLODipine 5 MG tablet  Commonly known as:  NORVASC      TAKE these medications        albuterol 108 (90 Base) MCG/ACT inhaler  Commonly known as:  PROVENTIL HFA  Inhale 2 puffs into the lungs every 4 (four) hours as needed for wheezing or shortness of breath.     bisacodyl 10  MG suppository  Commonly known as:  DULCOLAX  Place 1 suppository (10 mg total) rectally daily as needed for moderate constipation.     budesonide-formoterol 160-4.5 MCG/ACT inhaler  Commonly known as:  SYMBICORT  one - two inhalations every 12 hours; gargle and spit after use     carvedilol 25 MG tablet  Commonly known as:  COREG  Take 0.5-1 tablets (12.5-25 mg total) by mouth 2 (two) times daily with a meal. TAKE 1 TABLET BY MOUTH EVERY MORNING AND ONE-HALF TAB EVERY EVENING     Cod Liver Oil Caps  Take 2 capsules by mouth daily.     DULoxetine 60 MG capsule  Commonly known as:  CYMBALTA  Take 1 capsule (60 mg total) by mouth daily.     ECOTRIN LOW STRENGTH 81 MG EC tablet  Generic drug:  aspirin  Take  81 mg by mouth daily.     Fish Oil 1000 MG Caps  Take 1,000 mg by mouth daily.     Flax Seed Oil 1000 MG Caps  Take 1,000 mg by mouth 2 (two) times daily.     folic acid [id number] MCG tablet  Commonly known as:  FOLVITE  Take 800 mcg by mouth daily.     furosemide 40 MG tablet  Commonly known as:  LASIX  Take 1 tablet (40 mg total) by mouth daily as needed for fluid or edema. Take 1-1.5 Tablets by mouth daily PRN pedal edema     gabapentin 300 MG capsule  Commonly known as:  NEURONTIN  Take 1 capsule (300 mg total) by mouth 3 (three) times daily.     glucose blood test strip  Commonly known as:  ONE TOUCH ULTRA TEST  Use as instructed     hydrALAZINE 50 MG tablet  Commonly known as:  APRESOLINE  Take 1 tab by mouth every 8 hrs     multivitamin tablet  Take 1 tablet by mouth daily.     omeprazole 40 MG capsule  Commonly known as:  PRILOSEC  Take 1 capsule (40 mg total) by mouth 2 (two) times daily.     onetouch ultrasoft lancets  Use as instructed     potassium chloride SA 20 MEQ tablet  Commonly known as:  K-DUR,KLOR-CON  Take 2 tablets (40 mEq total) by mouth daily as needed (with Lasix).     simvastatin 40 MG tablet  Commonly known as:  ZOCOR  Take 1  tablet (40 mg total) by mouth daily. --- Please establish with new PCP for further refills     topiramate 100 MG tablet  Commonly known as:  TOPAMAX  Take 1 tablet (100 mg total) by mouth 2 (two) times daily.     verapamil 120 MG 24 hr capsule  Commonly known as:  VERELAN PM  Take 1 capsule (120 mg total) by mouth daily.       Allergies  Allergen Reactions  . Penicillins Rash      . Benazepril Hcl Other (See Comments)    Unknown allergy  . Hydrochlorothiazide W-Triamterene     REACTION: hypokalemia  . Metronidazole Other (See Comments)    Unknown allergy  . Spironolactone     REACTION: kidney problems  . Sulfonamide Derivatives     REACTION: internal "burning"  . Torsemide Other (See Comments)    Unknown allergy  . Valsartan Other (See Comments)    Unknown allergy      The results of significant diagnostics from this hospitalization (including imaging, microbiology, ancillary and laboratory) are listed below for reference.    Significant Diagnostic Studies: Dg Chest 2 View  04/11/2015  CLINICAL DATA:  Leg edema and pain with swelling. Hypertension diabetes. EXAM: CHEST  2 VIEW COMPARISON:  09/05/2013 FINDINGS: Atherosclerotic aortic arch. Thoracic spondylosis. Mild enlargement of the cardiopericardial silhouette with indistinct upper zone pulmonary vasculature. No overt edema. No blunting of the costophrenic angles. Body habitus reduces diagnostic sensitivity and specificity. IMPRESSION: 1. Mild enlargement of the cardiopericardial silhouette with pulmonary venous hypertension but no overt edema. 2. Thoracic spondylosis. Electronically Signed   By: Van Clines M.D.   On: 04/11/2015 20:15   Dg Abd 1 View  04/12/2015  CLINICAL DATA:  Nausea, vomiting. EXAM: ABDOMEN - 1 VIEW COMPARISON:  None. FINDINGS: The bowel gas pattern is normal. Phleboliths are noted in the pelvis. IMPRESSION: There is no evidence of  bowel obstruction or ileus. Electronically Signed   By: Marijo Conception, M.D.   On: 04/12/2015 16:37   Dg Chest Port 1 View  04/16/2015  CLINICAL DATA:  Lower leg swelling, history of morbid obesity diabetes and hypertension, hypoxia EXAM: PORTABLE CHEST 1 VIEW COMPARISON:  04/11/2015 FINDINGS: Moderate cardiac enlargement. Vascular pattern normal. No focal consolidation or effusion. IMPRESSION: No active disease. Electronically Signed   By: Skipper Cliche M.D.   On: 04/16/2015 12:51    Microbiology: Recent Results (from the past 240 hour(s))  Rapid strep screen (not at [facility])     Status: None   Collection Time: 04/16/15 12:01 PM  Result Value Ref Range Status   Streptococcus, Group A Screen (Direct) NEGATIVE NEGATIVE Final    Comment: (NOTE) A Rapid Antigen test may result negative if the antigen level in the sample is below the detection level of this test. The FDA has not cleared this test as a stand-alone test therefore the rapid antigen negative result has reflexed to a Group A Strep culture.      Labs: Basic Metabolic Panel:  Recent Labs Lab 04/11/15 1606 04/13/15 0508 04/14/15 0539 04/15/15 0415 04/16/15 0413  NA 143 144 141 138 140  K 3.6 3.8 4.2 3.8 4.0  CL 107 104 101 100* 100*  CO2 29 32 28 31 32  GLUCOSE 91 131* 156* 135* 103*  BUN 19 14 20  26* 21*  CREATININE 1.19* 1.19* 1.48* 1.36* 1.12*  CALCIUM 8.8* 8.6* 8.4* 8.4* 8.5*   Liver Function Tests:  Recent Labs Lab 04/10/15 1505 04/11/15 1606  AST 12 16  ALT 9 13*  ALKPHOS 60 68  BILITOT 0.5 0.6  PROT 6.3 7.1  ALBUMIN 3.7 3.7   No results for input(s): LIPASE, AMYLASE in the last 168 hours. No results for input(s): AMMONIA in the last 168 hours. CBC:  Recent Labs Lab 04/11/15 1606 04/13/15 0508 04/14/15 0539 04/15/15 0415 04/16/15 0413  WBC 5.0 5.7 7.3 5.9 6.7  HGB 12.1 11.1* 11.6* 10.9* 10.3*  HCT 39.2 37.8 38.7 34.8* 33.9*  MCV 94.7 98.2 93.5 93.0 92.9  PLT 164 181 175 140* 153   Cardiac Enzymes: No results for input(s): CKTOTAL, CKMB, CKMBINDEX,  TROPONINI in the last 168 hours. BNP: BNP (last 3 results)  Recent Labs  04/11/15 1606  BNP 75.2    ProBNP (last 3 results) No results for input(s): PROBNP in the last 8760 hours.  CBG: No results for input(s): GLUCAP in the last 168 hours.     SignedDebbe Odea, MD [facility] 04/16/2015, 1:39 PM

## 2015-04-16 NOTE — Progress Notes (Signed)
CSW received notification pt interested in SNF and agreeable to search.  [facility] search initiated.   CSW followed up with pt at bedside to provide SNF bed offers.   Pt chooses [facility] and Rehab.   CSW contacted [facility] and Rehab and confirmed bed availability.   Pt had questions about what facility provides and CSW asked [facility] to contact pt to discuss.  CSW to continue to follow and facilitate pt discharge needs to [place] when medically ready.  Alison Murray, MSW, [place] Work [phone]

## 2015-04-16 NOTE — Clinical Social Work Placement (Signed)
   CLINICAL SOCIAL WORK PLACEMENT  NOTE  Date:  04/16/2015  Patient Details  Name: April [name] MRN: [id number] Date of Birth: 01-17-1951  Clinical Social Work is seeking post-discharge placement for this patient at the [place] level of care (*CSW will initial, date and re-position this form in  chart as items are completed):  Yes   Patient/family provided with [place] Work Department's list of facilities offering this level of care within the geographic area requested by the patient (or if unable, by the patient's family).  Yes   Patient/family informed of their freedom to choose among providers that offer the needed level of care, that participate in Medicare, Medicaid or managed care program needed by the patient, have an available bed and are willing to accept the patient.  Yes   Patient/family informed of [facility]'s ownership interest in [facility] and [facility], as well as of the fact that they are under no obligation to receive care at these facilities.  PASRR submitted to EDS on 04/15/15     PASRR number received on 04/15/15     Existing PASRR number confirmed on       FL2 transmitted to all facilities in geographic area requested by pt/family on 04/15/15     FL2 transmitted to all facilities within larger geographic area on       Patient informed that his/her managed care company has contracts with or will negotiate with certain facilities, including the following:        Yes   Patient/family informed of bed offers received.  Patient chooses bed at [facility] recommends and patient chooses bed at      Patient to be transferred to [facility] on  .  Patient to be transferred to facility by       Patient family notified on   of transfer.  Name of family member notified:        PHYSICIAN Please sign FL2     Additional Comment:     _______________________________________________ Ladell Pier, LCSW 04/16/2015, 10:56 AM

## 2015-04-16 NOTE — Progress Notes (Signed)
Called [facility] spoke with & gave report to [place] arrived and awaiting patient transport

## 2015-04-16 NOTE — NC FL2 (Signed)
[place] MEDICAID FL2 LEVEL OF CARE SCREENING TOOL     IDENTIFICATION  Patient Name: April [name] Birthdate: 1950-02-06 Sex: female Admission Date (Current Location): 04/11/2015  [facility] and [place] Number:  Herbalist and Address:  [facility],  [place] [address], [place]      Provider Number: [id number]  Attending Physician Name and Address:  Debbe Odea, MD  Relative Name and Phone Number:       Current Level of Care: SNF Recommended Level of Care: [place] Prior Approval Number:    Date Approved/Denied:   PASRR Number: [id number]  Discharge Plan: SNF    Current Diagnoses: Patient Active Problem List   Diagnosis Date Noted  . Bilateral leg edema 04/11/2015  . Cellulitis of both lower extremities 04/11/2015  . Abnormality of gait 01/03/2015  . Hereditary and idiopathic peripheral neuropathy 01/03/2015  . GERD (gastroesophageal reflux disease) 06/17/2014  . Morbid obesity with BMI of 50.0-59.9, adult ([place]) 09/05/2013  . Other abnormal glucose 08/05/2013  . Hx of colonic polyps 12/15/2012  . Vitamin B12 deficiency 11/03/2012  . Intrinsic asthma 03/23/2012  . Chronic diastolic heart failure ([place]) 02/20/2011  . OSA (obstructive sleep apnea) 09/17/2010  . URINARY URGENCY 01/08/2010  . CAD, NATIVE VESSEL 11/20/2008  . OSTEOARTHRITIS, KNEES, BILATERAL, SEVERE 06/14/2008  . Metabolic syndrome [id number]  . Hyperlipidemia 05/10/2007  . Essential hypertension 01/18/2007  . HYPOKALEMIA 04/30/2006  . HX, PERSONAL, PEPTIC ULCER DISEASE 04/30/2006    Orientation RESPIRATION BLADDER Height & Weight     Self, Time, Situation, Place  O2 (2L) Continent Weight: (!) 386 lb 14.5 oz (175.5 kg) Height:  5\' 7"  (170.2 cm)  BEHAVIORAL SYMPTOMS/MOOD NEUROLOGICAL BOWEL NUTRITION STATUS   (no behaviors)  (NONE) Continent Diet (Diet Heart)  AMBULATORY STATUS COMMUNICATION OF NEEDS Skin   Extensive Assist Verbally Other (Comment)  (Bilateral edema, consistent with lymphedema to bilateral lower legs.See additional comments for treatment)                       Personal Care Assistance Level of Assistance  Bathing, Feeding, Dressing Bathing Assistance: Maximum assistance Feeding assistance: Limited assistance Dressing Assistance: Maximum assistance     Functional Limitations Info  Sight, Hearing, Speech Sight Info: Adequate Hearing Info: Adequate Speech Info: Adequate    SPECIAL CARE FACTORS FREQUENCY  PT (By licensed PT), OT (By licensed OT)     PT Frequency: 5 x a week OT Frequency: 5 x a week            Contractures      Additional Factors Info  Code Status, Allergies Code Status Info: FULL code status Allergies Info: Penicillins, Benazepril Hcl, Hydrochlorothiazide W-triamterene, Metronidazole, Spironolactone, Sulfonamide Derivatives, Torsemide, Valsartan           Current Medications (04/16/2015):  This is the current hospital active medication list Current Facility-Administered Medications  Medication Dose Route Frequency Provider Last Rate Last Dose  . 0.9 %  sodium chloride infusion  250 mL Intravenous PRN [name], MD      . acetaminophen (TYLENOL) tablet 650 mg  650 mg Oral Q6H PRN [name], MD   650 mg at 04/13/15 1053   Or  . acetaminophen (TYLENOL) suppository 650 mg  650 mg Rectal Q6H PRN [name], MD      . amLODipine (NORVASC) tablet 5 mg  5 mg Oral Daily [name], MD   5 mg at 04/16/15 1028  . aspirin EC  tablet 81 mg  81 mg Oral Daily [name], MD   81 mg at 04/16/15 1027  . atorvastatin (LIPITOR) tablet 20 mg  20 mg Oral q1800 [name], MD   20 mg at 04/15/15 1720  . bisacodyl (DULCOLAX) suppository 10 mg  10 mg Rectal Daily PRN [name], MD      . carvedilol (COREG) tablet 12.5 mg  12.5 mg Oral BID WC [name], MD   12.5 mg at 04/16/15 0816  . docusate sodium (COLACE) capsule 100 mg  100 mg Oral BID [name], MD   100 mg  at 04/16/15 1027  . doxycycline (VIBRA-TABS) tablet 100 mg  100 mg Oral Q12H Domenic Polite, MD   100 mg at 04/16/15 1027  . enoxaparin (LOVENOX) injection 80 mg  80 mg Subcutaneous QHS [name], MD   80 mg at 04/15/15 2038  . folic acid (FOLVITE) tablet 0.5 mg  500 mcg Oral Daily [name], MD   0.5 mg at 04/16/15 1000  . furosemide (LASIX) tablet 40 mg  40 mg Oral BID Domenic Polite, MD   40 mg at 04/16/15 0800  . gabapentin (NEURONTIN) capsule 300 mg  300 mg Oral TID [name], MD   300 mg at 04/16/15 1022  . hydrALAZINE (APRESOLINE) tablet 50 mg  50 mg Oral 3 times per day [name], MD   50 mg at 04/16/15 0524  . HYDROcodone-acetaminophen (NORCO/VICODIN) 5-325 MG per tablet 1-2 tablet  1-2 tablet Oral Q4H PRN [name], MD   2 tablet at 04/16/15 0902  . menthol-cetylpyridinium (CEPACOL) lozenge 3 mg  1 lozenge Oral PRN Domenic Polite, MD   3 mg at 04/15/15 1528  . mometasone-formoterol (DULERA) 200-5 MCG/ACT inhaler 2 puff  2 puff Inhalation BID [name], MD   2 puff at 04/16/15 0925  . multivitamin with minerals tablet 1 tablet  1 tablet Oral Daily [name], MD   1 tablet at 04/16/15 1028  . omega-3 acid ethyl esters (LOVAZA) capsule 1 g  1 g Oral Daily [name], MD   1 g at 04/16/15 1027  . ondansetron (ZOFRAN) injection 4 mg  4 mg Intravenous Q4H PRN Domenic Polite, MD   4 mg at 04/12/15 1351  . pantoprazole (PROTONIX) EC tablet 40 mg  40 mg Oral Daily [name], MD   40 mg at 04/16/15 1027  . potassium chloride SA (K-DUR,KLOR-CON) CR tablet 40 mEq  40 mEq Oral BID Domenic Polite, MD   40 mEq at 04/16/15 1027  . promethazine (PHENERGAN) injection 12.5 mg  12.5 mg Intravenous Q6H PRN Domenic Polite, MD   12.5 mg at 04/12/15 1606  . sodium chloride flush (NS) 0.9 % injection 3 mL  3 mL Intravenous Q12H [name], MD   3 mL at 04/16/15 1000  . sodium chloride flush (NS) 0.9 % injection 3 mL  3 mL Intravenous PRN [name], MD      .  verapamil (CALAN-SR) CR tablet 360 mg  360 mg Oral Daily [name], MD   360 mg at 04/16/15 1026     Discharge Medications: Please see discharge summary for a list of discharge medications.  Relevant Imaging Results:  Relevant Lab Results:   Additional Information SSN: [id number]  Bedside Nurse: Please change dressings to bilat legs Q day as follows: Apply xeroform to weeping/blistered areas of legs, then cover with ABD pads, then kerlex in a spiral fashion, beginning just behind toes to below knees. Apply Ace  wrap in the same spiral fashion for light compression.  KIDD, SUZANNA A, LCSW

## 2015-04-16 NOTE — Progress Notes (Signed)
OT  Note  Patient Details Name: MCKENZY BECERRIL MRN: [id number] DOB: Jun 22, 1950   Cancelled Treatment:     Noted plans for SNF- will defer OT eval to SNF.  Betsy Pries 04/16/2015, 12:11 PM

## 2015-04-16 NOTE — Progress Notes (Signed)
Pt for discharge to [facility] and Rehab.  CSW facilitated pt discharge needs including contacting facility, faxing pt discharge information via epic hub, discussing with pt at bedside, providing RN phone number to call report, and arranging ambulance transport for pt to [facility] and Rehab.  No further social work needs identified at this time  [place] signing off.   Alison Murray, MSW, [place] Work [phone]

## 2015-04-16 NOTE — Clinical Social Work Placement (Signed)
   CLINICAL SOCIAL WORK PLACEMENT  NOTE  Date:  04/16/2015  Patient Details  Name: April [name] MRN: [id number] Date of Birth: [birth year]  Clinical Social Work is seeking post-discharge placement for this patient at the [place] level of care (*CSW will initial, date and re-position this form in  chart as items are completed):  Yes   Patient/family provided with [place] Work Department's list of facilities offering this level of care within the geographic area requested by the patient (or if unable, by the patient's family).  Yes   Patient/family informed of their freedom to choose among providers that offer the needed level of care, that participate in Medicare, Medicaid or managed care program needed by the patient, have an available bed and are willing to accept the patient.  Yes   Patient/family informed of [facility]'s ownership interest in [facility] and [facility], as well as of the fact that they are under no obligation to receive care at these facilities.  PASRR submitted to EDS on 04/15/15     PASRR number received on 04/15/15     Existing PASRR number confirmed on       FL2 transmitted to all facilities in geographic area requested by pt/family on 04/15/15     FL2 transmitted to all facilities within larger geographic area on       Patient informed that his/her managed care company has contracts with or will negotiate with certain facilities, including the following:        Yes   Patient/family informed of bed offers received.  Patient chooses bed at [facility] recommends and patient chooses bed at      Patient to be transferred to [facility] on 04/16/15.  Patient to be transferred to facility by ambulance [facility])     Patient family notified on 04/16/15 of transfer.  Name of family member notified:  pt notified at bedside     PHYSICIAN Please sign FL2     Additional  Comment:    _______________________________________________ Ladell Pier, LCSW 04/16/2015, 1:44 PM

## 2015-04-18 ENCOUNTER — Non-Acute Institutional Stay (SKILLED_NURSING_FACILITY): Payer: Medicare Other | Admitting: Internal Medicine

## 2015-04-18 ENCOUNTER — Encounter: Payer: Self-pay | Admitting: Internal Medicine

## 2015-04-18 DIAGNOSIS — I10 Essential (primary) hypertension: Secondary | ICD-10-CM | POA: Diagnosis not present

## 2015-04-18 DIAGNOSIS — R600 Localized edema: Secondary | ICD-10-CM | POA: Diagnosis not present

## 2015-04-18 DIAGNOSIS — E785 Hyperlipidemia, unspecified: Secondary | ICD-10-CM

## 2015-04-18 DIAGNOSIS — G609 Hereditary and idiopathic neuropathy, unspecified: Secondary | ICD-10-CM

## 2015-04-18 DIAGNOSIS — J4520 Mild intermittent asthma, uncomplicated: Secondary | ICD-10-CM | POA: Diagnosis not present

## 2015-04-18 DIAGNOSIS — E538 Deficiency of other specified B group vitamins: Secondary | ICD-10-CM | POA: Diagnosis not present

## 2015-04-18 DIAGNOSIS — Z6841 Body Mass Index (BMI) 40.0 and over, adult: Secondary | ICD-10-CM

## 2015-04-18 DIAGNOSIS — E1140 Type 2 diabetes mellitus with diabetic neuropathy, unspecified: Secondary | ICD-10-CM | POA: Diagnosis not present

## 2015-04-18 DIAGNOSIS — E1149 Type 2 diabetes mellitus with other diabetic neurological complication: Secondary | ICD-10-CM | POA: Insufficient documentation

## 2015-04-18 DIAGNOSIS — J9611 Chronic respiratory failure with hypoxia: Secondary | ICD-10-CM

## 2015-04-18 DIAGNOSIS — I5032 Chronic diastolic (congestive) heart failure: Secondary | ICD-10-CM | POA: Diagnosis not present

## 2015-04-18 LAB — CULTURE, GROUP A STREP (THRC)

## 2015-04-18 NOTE — Progress Notes (Signed)
Patient ID: April [name], female   DOB: 04/12/1950, 65 y.o.   MRN: [id number]    DATE: 04/18/15  Location:  [facility]    Place of Service: SNF (31)   Extended Emergency Contact Information Primary Emergency Contact: Longest,Dwight Address: [place], [place] Phone: [phone] Relation: Spouse  Advanced Directive information Does patient have an advance directive?: Yes, Type of Advance Directive: Living will, Does patient want to make changes to advanced directive?: No - Patient declined  Chief Complaint  Patient presents with  . New Admit To SNF    HPI:  65 yo female seen today as a new admission into SNF following hospital stay for b/l leg edema with cellulitis, HTN, chronic diastolic HF, hypoxic respiratory failure, morbid obesity, OSA, DM, asthma and PUD. 2D echo revealed no right HF but grade 1 diastolic dysfunction noted. CXR showed no acute process. She was diuresed aggressively with IV lasix and 4700cc removed. norvasc stopped. abx completed prior to d/c. She presents to SNF for short term rehab.  Today pt states she wishes to go home as she can do same exercises at home. No f/c. She is down 2 lbs since yesterday. No other concerns. No nursing issues. No falls  DM - diet controlled. A1c 5.9%  Hyperlipidemia - stable on simvastatin  CAD/HTN/HF - BP elevated. Currently takes verapamil, hydralazine, lasix and coreg  Peripheral neuropathy - stable on topamax, cymbalta and gabapentin  Asthma/OSA - stable on symbicort and proventil HFA  Anemia/B12 deficiency - stable on monthly B12 injections and folic acid  GERD - stable on omeprazole. Takes colace  Morbid obesity - due to excessive calories and sedentary lifestyle  Past Medical History  Diagnosis Date  . Meralgia paresthetica     Dr. [name]  . CTS (carpal tunnel syndrome)   . CAD (coronary artery disease)   . Osteoarthrosis, unspecified whether  generalized or localized, lower leg   . Diabetes mellitus   . Hypertension   . Hyperlipidemia   . Morbid obesity ([place])   . PUD (peptic ulcer disease)   . Unspecified hereditary and idiopathic peripheral neuropathy   . Morbid obesity ([place])   . Type II or unspecified type diabetes mellitus without mention of complication, not stated as uncontrolled   . Carpal tunnel syndrome   . Asthma   . Colon polyp, hyperplastic 2007 & 2012  . Anemia     Past Surgical History  Procedure Laterality Date  . Abdominal hysterectomy    . Dilation and curettage of uterus      multiple  . Tonsillectomy and adenoidectomy    . Cardiac catheterization  2002    non obstructive disease  . Renal calculi  12/1997    SVT with induction of anesthesia  . Colonoscopy with polypectomy  2007 & 2012     hyperplastic ;Dr Watt Climes    Patient Care Team: [name], MD as PCP - General (Internal Medicine)  Social History   Social History  . Marital Status: Married    Spouse Name: [name]  . Number of Children: 1  . Years of Education: BS   Occupational History  . Disabled    Social History Main Topics  . Smoking status: Never Smoker   . Smokeless tobacco: Never Used  . Alcohol Use: No  . Drug Use: No  . Sexual Activity: Not on file   Other Topics Concern  .  Not on file   Social History Narrative   Patient lives at home with her husband [name] Patient is retired and has a Conservation officer, nature.    Caffeine - some times.   Right handed.     reports that she has never smoked. She has never used smokeless tobacco. She reports that she does not drink alcohol or use illicit drugs.  Immunization History  Administered Date(s) Administered  . Influenza Split 12/03/2010, 11/03/2011  . Influenza Whole 11/16/2006, 11/22/2007, 10/31/2008, 10/29/2009  . Influenza,inj,Quad PF,36+ Mos 10/19/2012, 10/25/2013, 10/29/2014  . Pneumococcal Polysaccharide-23 03/24/2005, 01/04/2012  . Td 06/24/2009  . Tdap  10/09/2014  . Zoster 01/15/2012    Allergies  Allergen Reactions  . Penicillins Rash      . Benazepril Hcl Other (See Comments)    Unknown allergy  . Hydrochlorothiazide W-Triamterene     REACTION: hypokalemia  . Metronidazole Other (See Comments)    Unknown allergy  . Spironolactone     REACTION: kidney problems  . Sulfonamide Derivatives     REACTION: internal "burning"  . Torsemide Other (See Comments)    Unknown allergy  . Valsartan Other (See Comments)    Unknown allergy    Medications: Patient's Medications  New Prescriptions   CARVEDILOL (COREG) 25 MG TABLET    Take 1 tablet (25 mg total) by mouth 2 (two) times daily with a meal.   DOCUSATE SODIUM (COLACE) 100 MG CAPSULE    Take 1 capsule (100 mg total) by mouth 2 (two) times daily as needed for mild constipation.  Previous Medications   ALBUTEROL (PROVENTIL HFA) 108 (90 BASE) MCG/ACT INHALER    Inhale 2 puffs into the lungs every 4 (four) hours as needed for wheezing or shortness of breath.   ASPIRIN (ECOTRIN LOW STRENGTH) 81 MG EC TABLET    Take 81 mg by mouth daily.     BUDESONIDE-FORMOTEROL (SYMBICORT) 160-4.5 MCG/ACT INHALER    one - two inhalations every 12 hours; gargle and spit after use   COD LIVER OIL CAPS    Take 2 capsules by mouth daily.   DULOXETINE (CYMBALTA) 60 MG CAPSULE    Take 1 capsule (60 mg total) by mouth daily.   FLAXSEED, LINSEED, (FLAX SEED OIL) 1000 MG CAPS    Take 1,000 mg by mouth 2 (two) times daily.    FOLIC ACID (FOLVITE) [id number] MCG TABLET    Take 800 mcg by mouth daily.   FUROSEMIDE (LASIX) 40 MG TABLET    Take 1 tablet (40 mg total) by mouth daily as needed for fluid or edema. Take 1-1.5 Tablets by mouth daily PRN pedal edema   GABAPENTIN (NEURONTIN) 300 MG CAPSULE    Take 1 capsule (300 mg total) by mouth 3 (three) times daily.   GLUCOSE BLOOD (ONE TOUCH ULTRA TEST) TEST STRIP    Use as instructed   HYDRALAZINE (APRESOLINE) 50 MG TABLET    Take 1 tab by mouth every 8 hrs   LANCETS  (ONETOUCH ULTRASOFT) LANCETS    Use as instructed   MULTIPLE VITAMIN (MULTIVITAMIN) TABLET    Take 1 tablet by mouth daily.     OMEGA-3 FATTY ACIDS (FISH OIL) 1000 MG CAPS    Take 1,000 mg by mouth daily.    OMEPRAZOLE (PRILOSEC) 40 MG CAPSULE    Take 1 capsule (40 mg total) by mouth 2 (two) times daily.   POTASSIUM CHLORIDE SA (K-DUR,KLOR-CON) 20 MEQ TABLET    Take 2 tablets (40 mEq total) by  mouth daily as needed (with Lasix).   SIMVASTATIN (ZOCOR) 40 MG TABLET    Take 1 tablet (40 mg total) by mouth daily. --- Please establish with new PCP for further refills   TOPIRAMATE (TOPAMAX) 100 MG TABLET    Take 1 tablet (100 mg total) by mouth 2 (two) times daily.  Modified Medications   Modified Medication Previous Medication   VERAPAMIL (VERELAN PM) 120 MG 24 HR CAPSULE verapamil (VERELAN PM) 120 MG 24 hr capsule      Take 1 capsule (120 mg total) by mouth daily.    Take 1 capsule (120 mg total) by mouth daily.  Discontinued Medications   BISACODYL (DULCOLAX) 10 MG SUPPOSITORY    Place 1 suppository (10 mg total) rectally daily as needed for moderate constipation.   CARVEDILOL (COREG) 12.5 MG TABLET    Give 25 mg every morning and Give 1 tablet by mouth every evening ( HTN)   CARVEDILOL (COREG) 25 MG TABLET    Take 0.5-1 tablets (12.5-25 mg total) by mouth 2 (two) times daily with a meal. TAKE 1 TABLET BY MOUTH EVERY MORNING AND ONE-HALF TAB EVERY EVENING    Review of Systems  Respiratory: Positive for shortness of breath.   Cardiovascular: Positive for leg swelling.  Skin: Positive for wound.  All other systems reviewed and are negative.   Filed Vitals:   04/18/15 1347  BP: 180/90  Pulse: 77  Temp: 98 F (36.7 C)  TempSrc: Oral  Resp: 20  Weight: 388 lb (175.996 kg)   Body mass index is 60.76 kg/(m^2).  Physical Exam  Constitutional: She is oriented to person, place, and time. She appears well-developed and well-nourished.  Sitting in bed in NAD  HENT:  Mouth/Throat: Oropharynx  is clear and moist. No oropharyngeal exudate.  Eyes: Pupils are equal, round, and reactive to light. No scleral icterus.  Neck: Neck supple. Carotid bruit is not present. No tracheal deviation present. No thyromegaly present.  Cardiovascular: Normal rate, regular rhythm and intact distal pulses.  Exam reveals no gallop and no friction rub.   Murmur (1/6 SEM) heard. B/l LE edema +2 pitting with chronic venous stasis changes. No calf TTP  Pulmonary/Chest: Effort normal and breath sounds normal. No stridor. No respiratory distress. She has no wheezes. She has no rales.  Abdominal: Soft. Bowel sounds are normal. She exhibits no distension and no mass. There is no hepatomegaly. There is no tenderness. There is no rebound and no guarding.  obese  Musculoskeletal: She exhibits edema.  Lymphadenopathy:    She has no cervical adenopathy.  Neurological: She is alert and oriented to person, place, and time.  Skin: Skin is warm and dry. No rash noted.  Right lateral leg vesicle intact. No d/c or bleeding  Psychiatric: She has a normal mood and affect. Judgment and thought content normal. She is aggressive.     Labs reviewed: Component Date Value Ref Range Status  . WBC 04/11/2015 5.0  4.0 - 10.5 K/uL  . RBC 04/11/2015 4.14  3.87 - 5.11 MIL/uL  . Hemoglobin 04/11/2015 12.1  12.0 - 15.0 g/dL  . HCT 04/11/2015 39.2  36.0 - 46.0 %  . MCV 04/11/2015 94.7  78.0 - 100.0 fL  . MCH 04/11/2015 29.2  26.0 - 34.0 pg  . MCHC 04/11/2015 30.9  30.0 - 36.0 g/dL  . RDW 04/11/2015 15.6* 11.5 - 15.5 %  . Platelets 04/11/2015 164  150 - 400 K/uL  . Sodium 04/11/2015 143  135 - 145  mmol/L  . Potassium 04/11/2015 3.6  3.5 - 5.1 mmol/L  . Chloride 04/11/2015 107  101 - 111 mmol/L  . CO2 04/11/2015 29  22 - 32 mmol/L  . Glucose, Bld 04/11/2015 91  65 - 99 mg/dL  . BUN 04/11/2015 19  6 - 20 mg/dL  . Creatinine, Ser 04/11/2015 1.19* 0.44 - 1.00 mg/dL  . Calcium 04/11/2015 8.8* 8.9 - 10.3 mg/dL  . Total Protein  04/11/2015 7.1  6.5 - 8.1 g/dL  . Albumin 04/11/2015 3.7  3.5 - 5.0 g/dL  . AST 04/11/2015 16  15 - 41 U/L  . ALT 04/11/2015 13* 14 - 54 U/L  . Alkaline Phosphatase 04/11/2015 68  38 - 126 U/L  . Total Bilirubin 04/11/2015 0.6  0.3 - 1.2 mg/dL  . GFR calc non Af Amer 04/11/2015 47* >60 mL/min  . GFR calc Af Amer 04/11/2015 55* >60 mL/min    . Anion gap 04/11/2015 7  5 - 15  . B Natriuretic Peptide 04/11/2015 75.2  0.0 - 100.0 pg/mL  . Color, Urine 04/12/2015 AMBER YELLOW  . APPearance 04/12/2015 CLEAR  CLEAR  . Specific Gravity, Urine 04/12/2015 1.020  1.005 - 1.030  . pH 04/12/2015 5.0  5.0 - 8.0  . Glucose, UA 04/12/2015 NEGATIVE  NEGATIVE mg/dL  . Hgb urine dipstick 04/12/2015 NEGATIVE  NEGATIVE  . Bilirubin Urine 04/12/2015 NEGATIVE  NEGATIVE  . Ketones, ur 04/12/2015 NEGATIVE  NEGATIVE mg/dL  . Protein, ur 04/12/2015 NEGATIVE  NEGATIVE mg/dL  . Nitrite 04/12/2015 NEGATIVE  NEGATIVE  . Leukocytes, UA 04/12/2015 NEGATIVE  NEGATIVE  . Weight 04/13/2015 6165.83    . Height 04/13/2015 67    . BP 04/13/2015 115/88    . pH, Ven 04/11/2015 7.367* 7.250 - 7.300  . pCO2, Ven 04/11/2015 47.4  45.0 - 50.0 mmHg  . pO2, Ven 04/11/2015 Please note change in reference range.  31.0 - 45.0 mmHg  . Bicarbonate 04/11/2015 26.6* 20.0 - 24.0 mEq/L  . TCO2 04/11/2015 24.8  0 - 100 mmol/L  . Acid-Base Excess 04/11/2015 1.3  0.0 - 2.0 mmol/L  . O2 Saturation 04/11/2015 55.6    . Patient temperature 04/11/2015 98.2    . Collection site 04/11/2015 VEIN    . Drawn by 04/11/2015 [id number]    . Sample type 04/11/2015 VEIN    . WBC 04/13/2015 5.7  4.0 - 10.5 K/uL  . RBC 04/13/2015 3.85* 3.87 - 5.11 MIL/uL  . Hemoglobin 04/13/2015 11.1* 12.0 - 15.0 g/dL  . HCT 04/13/2015 37.8  36.0 - 46.0 %  . MCV 04/13/2015 98.2  78.0 - 100.0 fL  . MCH 04/13/2015 28.8  26.0 - 34.0 pg  . MCHC 04/13/2015 29.4* 30.0 - 36.0 g/dL  . RDW 04/13/2015 15.9* 11.5 - 15.5 %  . Platelets 04/13/2015 181  150 - 400 K/uL  . Sodium  04/13/2015 144  135 - 145 mmol/L  . Potassium 04/13/2015 3.8  3.5 - 5.1 mmol/L  . Chloride 04/13/2015 104  101 - 111 mmol/L  . CO2 04/13/2015 32  22 - 32 mmol/L  . Glucose, Bld 04/13/2015 131* 65 - 99 mg/dL  . BUN 04/13/2015 14  6 - 20 mg/dL  . Creatinine, Ser 04/13/2015 1.19* 0.44 - 1.00 mg/dL  . Calcium 04/13/2015 8.6* 8.9 - 10.3 mg/dL  . GFR calc non Af Amer 04/13/2015 47* >60 mL/min  . GFR calc Af Amer 04/13/2015 55* >60 mL/min  . Anion gap 04/13/2015 8  5 - 15  . WBC 04/14/2015  7.3  4.0 - 10.5 K/uL  . RBC 04/14/2015 4.14  3.87 - 5.11 MIL/uL  . Hemoglobin 04/14/2015 11.6* 12.0 - 15.0 g/dL  . HCT 04/14/2015 38.7  36.0 - 46.0 %  . MCV 04/14/2015 93.5  78.0 - 100.0 fL  . MCH 04/14/2015 28.0  26.0 - 34.0 pg  . MCHC 04/14/2015 30.0  30.0 - 36.0 g/dL  . RDW 04/14/2015 15.3  11.5 - 15.5 %  . Platelets 04/14/2015 175  150 - 400 K/uL  . Sodium 04/14/2015 141  135 - 145 mmol/L  . Potassium 04/14/2015 4.2  3.5 - 5.1 mmol/L  . Chloride 04/14/2015 101  101 - 111 mmol/L  . CO2 04/14/2015 28  22 - 32 mmol/L  . Glucose, Bld 04/14/2015 156* 65 - 99 mg/dL  . BUN 04/14/2015 20  6 - 20 mg/dL  . Creatinine, Ser 04/14/2015 1.48* 0.44 - 1.00 mg/dL  . Calcium 04/14/2015 8.4* 8.9 - 10.3 mg/dL  . GFR calc non Af Amer 04/14/2015 36* >60 mL/min  . GFR calc Af Amer 04/14/2015 42* >60 mL/min  . Anion gap 04/14/2015 12  5 - 15  . WBC 04/15/2015 5.9  4.0 - 10.5 K/uL  . RBC 04/15/2015 3.74* 3.87 - 5.11 MIL/uL  . Hemoglobin 04/15/2015 10.9* 12.0 - 15.0 g/dL  . HCT 04/15/2015 34.8* 36.0 - 46.0 %  . MCV 04/15/2015 93.0  78.0 - 100.0 fL  . MCH 04/15/2015 29.1  26.0 - 34.0 pg  . MCHC 04/15/2015 31.3  30.0 - 36.0 g/dL  . RDW 04/15/2015 15.2  11.5 - 15.5 %  . Platelets 04/15/2015 140* 150 - 400 K/uL  . Sodium 04/15/2015 138  135 - 145 mmol/L  . Potassium 04/15/2015 3.8  3.5 - 5.1 mmol/L  . Chloride 04/15/2015 100* 101 - 111 mmol/L  . CO2 04/15/2015 31  22 - 32 mmol/L  . Glucose, Bld 04/15/2015 135* 65 - 99  mg/dL  . BUN 04/15/2015 26* 6 - 20 mg/dL  . Creatinine, Ser 04/15/2015 1.36* 0.44 - 1.00 mg/dL  . Calcium 04/15/2015 8.4* 8.9 - 10.3 mg/dL  . GFR calc non Af Amer 04/15/2015 40* >60 mL/min  . GFR calc Af Amer 04/15/2015 47* >60 mL/min    . Anion gap 04/15/2015 7  5 - 15  . Vancomycin Tr 04/15/2015 19  10.0 - 20.0 ug/mL  . WBC 04/16/2015 6.7  4.0 - 10.5 K/uL  . RBC 04/16/2015 3.65* 3.87 - 5.11 MIL/uL  . Hemoglobin 04/16/2015 10.3* 12.0 - 15.0 g/dL  . HCT 04/16/2015 33.9* 36.0 - 46.0 %  . MCV 04/16/2015 92.9  78.0 - 100.0 fL  . MCH 04/16/2015 28.2  26.0 - 34.0 pg  . MCHC 04/16/2015 30.4  30.0 - 36.0 g/dL  . RDW 04/16/2015 14.9  11.5 - 15.5 %  . Platelets 04/16/2015 153  150 - 400 K/uL  . Sodium 04/16/2015 140  135 - 145 mmol/L  . Potassium 04/16/2015 4.0  3.5 - 5.1 mmol/L  . Chloride 04/16/2015 100* 101 - 111 mmol/L  . CO2 04/16/2015 32  22 - 32 mmol/L  . Glucose, Bld 04/16/2015 103* 65 - 99 mg/dL  . BUN 04/16/2015 21* 6 - 20 mg/dL  . Creatinine, Ser 04/16/2015 1.12* 0.44 - 1.00 mg/dL  . Calcium 04/16/2015 8.5* 8.9 - 10.3 mg/dL  . GFR calc non Af Amer 04/16/2015 51* >60 mL/min  . GFR calc Af Amer 04/16/2015 59* >60 mL/min    . Anion gap 04/16/2015 8  5 - 15  .  Streptococcus, Group A Screen  04/16/2015 NEGATIVE  NEGATIVE  . Specimen Description 04/16/2015 THROAT    . Special Requests 04/16/2015 NONE Reflexed from [id number]     Dg Chest 2 View  04/11/2015  CLINICAL DATA:  Leg edema and pain with swelling. Hypertension diabetes. EXAM: CHEST  2 VIEW COMPARISON:  09/05/2013 FINDINGS: Atherosclerotic aortic arch. Thoracic spondylosis. Mild enlargement of the cardiopericardial silhouette with indistinct upper zone pulmonary vasculature. No overt edema. No blunting of the costophrenic angles. Body habitus reduces diagnostic sensitivity and specificity. IMPRESSION: 1. Mild enlargement of the cardiopericardial silhouette with pulmonary venous hypertension but no overt edema. 2. Thoracic  spondylosis. Electronically Signed   By: Van Clines M.D.   On: 04/11/2015 20:15   Dg Abd 1 View  04/12/2015  CLINICAL DATA:  Nausea, vomiting. EXAM: ABDOMEN - 1 VIEW COMPARISON:  None. FINDINGS: The bowel gas pattern is normal. Phleboliths are noted in the pelvis. IMPRESSION: There is no evidence of bowel obstruction or ileus. Electronically Signed   By: Marijo Conception, M.D.   On: 04/12/2015 16:37   Dg Chest Port 1 View  04/16/2015  CLINICAL DATA:  Lower leg swelling, history of morbid obesity diabetes and hypertension, hypoxia EXAM: PORTABLE CHEST 1 VIEW COMPARISON:  04/11/2015 FINDINGS: Moderate cardiac enlargement. Vascular pattern normal. No focal consolidation or effusion. IMPRESSION: No active disease. Electronically Signed   By: Skipper Cliche M.D.   On: 04/16/2015 12:51     Assessment/Plan   ICD-9-CM ICD-10-CM   1. Essential hypertension - suboptimally controlled 401.9 I10   2. Bilateral leg edema - improved 782.3 R60.0   3. Chronic diastolic heart failure (HCC) 428.32 I50.32   4. Hyperlipidemia 272.4 E78.5   5. Morbid obesity with BMI of 50.0-59.9, adult ([place]) 278.01 E66.01    V85.43 Z68.43   6. Hypoxemic respiratory failure, chronic (HCC) 518.83 J96.11    799.02    7. Hereditary and idiopathic peripheral neuropathy 356.9 G60.9   8. Type 2 diabetes mellitus with diabetic neuropathy, without long-term current use of insulin (HCC) - diet controlled 250.60 E11.40    357.2    9. Intrinsic asthma, mild intermittent, uncomplicated [id number]   10. Vitamin B12 deficiency 266.2 E53.8     VS qshift to follow BP. norvasc was stopped prior to hospital d/c as she is also taking verapamil. May need to increase coreg  Patient is being discharged with home health services: PT/OT/Nursing for wound care and HTN   Patient is being discharged with the following durable medical equipment:  none  Patient has been advised to f/u with their PCP in 1-2 weeks to bring them up to date on  their rehab stay.  They were provided with a 30 day supply of scripts for prescription medications and refills must be obtained from their PCP.  Allia Wiltsey S. Perlie Gold  [facility] [address] [place], [place] [address] [phone] Cell (Monday-Friday 8 AM - 5 PM) [phone] After 5 PM and follow prompts

## 2015-04-23 ENCOUNTER — Telehealth: Payer: Self-pay | Admitting: Internal Medicine

## 2015-04-23 NOTE — Telephone Encounter (Signed)
LVM informing HH

## 2015-04-23 NOTE — Telephone Encounter (Signed)
Please advise 

## 2015-04-23 NOTE — Telephone Encounter (Signed)
ok 

## 2015-04-23 NOTE — Telephone Encounter (Signed)
April [name] from [facility] requesting verbal orders for nursing 3 x 3wk then 2 x 5wks   Home health aid 3x 4wk For bathing  Wound on right leg wrapping in vaseline gauze and prolex and ace bandages

## 2015-04-24 ENCOUNTER — Ambulatory Visit (INDEPENDENT_AMBULATORY_CARE_PROVIDER_SITE_OTHER): Payer: Medicare Other | Admitting: Internal Medicine

## 2015-04-24 ENCOUNTER — Encounter: Payer: Self-pay | Admitting: Internal Medicine

## 2015-04-24 VITALS — BP 164/88 | HR 98 | Temp 98.0°F | Resp 16 | Wt 376.0 lb

## 2015-04-24 DIAGNOSIS — L97909 Non-pressure chronic ulcer of unspecified part of unspecified lower leg with unspecified severity: Secondary | ICD-10-CM

## 2015-04-24 DIAGNOSIS — I10 Essential (primary) hypertension: Secondary | ICD-10-CM

## 2015-04-24 DIAGNOSIS — L97911 Non-pressure chronic ulcer of unspecified part of right lower leg limited to breakdown of skin: Secondary | ICD-10-CM | POA: Diagnosis not present

## 2015-04-24 DIAGNOSIS — R600 Localized edema: Secondary | ICD-10-CM | POA: Diagnosis not present

## 2015-04-24 HISTORY — DX: Non-pressure chronic ulcer of unspecified part of unspecified lower leg with unspecified severity: L97.909

## 2015-04-24 MED ORDER — CARVEDILOL 25 MG PO TABS
25.0000 mg | ORAL_TABLET | Freq: Two times a day (BID) | ORAL | Status: DC
Start: 2015-04-24 — End: 2016-05-16

## 2015-04-24 MED ORDER — VERAPAMIL HCL ER 120 MG PO CP24: 120.0000 mg | ORAL_CAPSULE | Freq: Every day | ORAL | Status: DC

## 2015-04-24 MED ORDER — DOCUSATE SODIUM 100 MG PO CAPS: 100.0000 mg | ORAL_CAPSULE | Freq: Two times a day (BID) | ORAL | Status: DC | PRN

## 2015-04-24 NOTE — Assessment & Plan Note (Signed)
Not controlled today, unfortunately she does not take her BP at home Decrease verapamil to 120mg  daily Increase hydralazine to 50 mg three times a day Increase coreg 25 mg twice daily Lasix 40 mg twice a week Increase activity as tolerated Follow up in two weeks Will check cmp in two weeks

## 2015-04-24 NOTE — Patient Instructions (Addendum)
For blood pressure take: verapamil 120 mg daily        Hydralazine 50 mg three times a day        Coreg 25 mg twice daily  Monitor your legs, weight and blood pressure  Follow up in 2 weeks

## 2015-04-24 NOTE — Assessment & Plan Note (Signed)
Chronic but improved after IV diuresis in the hospital Still with edema Will try lasix 40 mg twice a week Continue wrapping legs Low sodium Increase activity Needs to work on weight loss Follow up in 2 weeks

## 2015-04-24 NOTE — Progress Notes (Signed)
Subjective:    Patient ID: April [name], female    DOB: July 26, 1950, 65 y.o.   MRN: [id number]  HPI She is here for follow up from the hospital and rehab.   She was admitted to the hospital for worsening of her leg edema and cellulitis.  She was taking lasix 40-60 mg daily, but it was not working.  Workup in the hospital ruled out CHF and the worsening edema was likely secondary to lymphedema and venous stasis.  She was diuresed with lasix and she was discharged with lasix prn only.  Her and her husband were taught how to do the ace wraps and advised to do them daily.  Home care started yesterday.  She was advised daily weights, but does not have a scale at home.  She completed 5 days of antibiotics for cellulitis of the legs- the erythema remaining was thought to be related to edema.  Her norvasc was discontinued and her verapamil was decreased from 360 to 120 to help decrease the edema.  Her other medications were continued.  Her oxygenation at rest was low, 86%, and she required 2 L oxygen.  Her cxr was normal and her hypoxia was likely due to obesity hypoventilation.    She still feels weak overall and tired.  She thinks this has improved slightly.  She is keeping her legs wrapped.  They  looks better, it is not as red, but they are still swollen.  She gets tired and short of breath walking around the house.  Home health just started coming yesterday.   Hypertension:  She is taking the 360 mg of verapamil - she did not know the dose was lowered.  She is taking hydralazoine twice a day, not the prescribed three times a day. She is taking coreg 12.5 mg twice a day, not 25 mg in the morning and 12.5 mg in the evening. She denies chest pain, palpitations, but has shortness of breath, which is not new. She has not taken the lasix since being home.     Medications and allergies reviewed with patient and updated if appropriate.  Patient Active Problem List   Diagnosis Date Noted  . Diabetes  mellitus with neurological manifestations ([place]) 04/18/2015  . Bilateral leg edema 04/11/2015  . Cellulitis of both lower extremities 04/11/2015  . Abnormality of gait 01/03/2015  . Hereditary and idiopathic peripheral neuropathy 01/03/2015  . GERD (gastroesophageal reflux disease) 06/17/2014  . Morbid obesity with BMI of 50.0-59.9, adult ([place]) 09/05/2013  . Other abnormal glucose 08/05/2013  . Hx of colonic polyps 12/15/2012  . Vitamin B12 deficiency 11/03/2012  . Intrinsic asthma 03/23/2012  . Chronic diastolic heart failure ([place]) 02/20/2011  . OSA (obstructive sleep apnea) 09/17/2010  . URINARY URGENCY 01/08/2010  . CAD, NATIVE VESSEL 11/20/2008  . OSTEOARTHRITIS, KNEES, BILATERAL, SEVERE 06/14/2008  . Metabolic syndrome [id number]  . Hyperlipidemia 05/10/2007  . Essential hypertension 01/18/2007  . HYPOKALEMIA 04/30/2006  . HX, PERSONAL, PEPTIC ULCER DISEASE 04/30/2006    Current Outpatient Prescriptions on File Prior to Visit  Medication Sig Dispense Refill  . aspirin (ECOTRIN LOW STRENGTH) 81 MG EC tablet Take 81 mg by mouth daily.      . bisacodyl (DULCOLAX) 10 MG suppository Place 1 suppository (10 mg total) rectally daily as needed for moderate constipation. 12 suppository 0  . budesonide-formoterol (SYMBICORT) 160-4.5 MCG/ACT inhaler one - two inhalations every 12 hours; gargle and spit after use 1 Inhaler 4  . carvedilol (COREG) 12.5 MG  tablet Give 25 mg every morning and Give 1 tablet by mouth every evening ( HTN)    . Cod Liver Oil CAPS Take 2 capsules by mouth daily.    . DULoxetine (CYMBALTA) 60 MG capsule Take 1 capsule (60 mg total) by mouth daily. 30 capsule 6  . Flaxseed, Linseed, (FLAX SEED OIL) 1000 MG CAPS Take 1,000 mg by mouth 2 (two) times daily.     . folic acid (FOLVITE) Q000111Q MCG tablet Take 800 mcg by mouth daily.    . furosemide (LASIX) 40 MG tablet Take 1 tablet (40 mg total) by mouth daily as needed for fluid or edema. Take 1-1.5 Tablets by mouth daily  PRN pedal edema 90 tablet 0  . gabapentin (NEURONTIN) 300 MG capsule Take 1 capsule (300 mg total) by mouth 3 (three) times daily. 90 capsule 6  . glucose blood (ONE TOUCH ULTRA TEST) test strip Use as instructed 100 each 3  . hydrALAZINE (APRESOLINE) 50 MG tablet Take 1 tab by mouth every 8 hrs 270 tablet 1  . Lancets (ONETOUCH ULTRASOFT) lancets Use as instructed 100 each 3  . Multiple Vitamin (MULTIVITAMIN) tablet Take 1 tablet by mouth daily.      . Omega-3 Fatty Acids (FISH OIL) 1000 MG CAPS Take 1,000 mg by mouth daily.     Marland Kitchen omeprazole (PRILOSEC) 40 MG capsule Take 1 capsule (40 mg total) by mouth 2 (two) times daily. 60 capsule 6  . potassium chloride SA (K-DUR,KLOR-CON) 20 MEQ tablet Take 2 tablets (40 mEq total) by mouth daily as needed (with Lasix).    Marland Kitchen simvastatin (ZOCOR) 40 MG tablet Take 1 tablet (40 mg total) by mouth daily. --- Please establish with new PCP for further refills 90 tablet 1  . topiramate (TOPAMAX) 100 MG tablet Take 1 tablet (100 mg total) by mouth 2 (two) times daily. 60 tablet 11  . verapamil (VERELAN PM) 120 MG 24 hr capsule Take 1 capsule (120 mg total) by mouth daily.    Marland Kitchen albuterol (PROVENTIL HFA) 108 (90 BASE) MCG/ACT inhaler Inhale 2 puffs into the lungs every 4 (four) hours as needed for wheezing or shortness of breath. 1 Inhaler 4   Current Facility-Administered Medications on File Prior to Visit  Medication Dose Route Frequency Provider Last Rate Last Dose  . cyanocobalamin ((VITAMIN B-12)) injection 1,000 mcg  1,000 mcg Intramuscular Q30 days Marcial Pacas, MD   1,000 mcg at 04/03/15 1411    Past Medical History  Diagnosis Date  . Meralgia paresthetica     Dr. [name]  . CTS (carpal tunnel syndrome)   . CAD (coronary artery disease)   . Osteoarthrosis, unspecified whether generalized or localized, lower leg   . Diabetes mellitus   . Hypertension   . Hyperlipidemia   . Morbid obesity ([place])   . PUD (peptic ulcer disease)   . Unspecified hereditary and  idiopathic peripheral neuropathy   . Morbid obesity ([place])   . Type II or unspecified type diabetes mellitus without mention of complication, not stated as uncontrolled   . Carpal tunnel syndrome   . Asthma   . Colon polyp, hyperplastic 2007 & 2012  . Anemia     Past Surgical History  Procedure Laterality Date  . Abdominal hysterectomy    . Dilation and curettage of uterus      multiple  . Tonsillectomy and adenoidectomy    . Cardiac catheterization  2002    non obstructive disease  . Renal calculi  12/1997  SVT with induction of anesthesia  . Colonoscopy with polypectomy  2007 & 2012     hyperplastic ;Dr Watt Climes    Social History   Social History  . Marital Status: Married    Spouse Name: [name]  . Number of Children: 1  . Years of Education: BS   Occupational History  . Disabled    Social History Main Topics  . Smoking status: Never Smoker   . Smokeless tobacco: Never Used  . Alcohol Use: No  . Drug Use: No  . Sexual Activity: Not Asked   Other Topics Concern  . None   Social History Narrative   Patient lives at home with her husband [name] Patient is retired and has a Conservation officer, nature.    Caffeine - some times.   Right handed.    Family History  Problem Relation Age of Onset  . Colon cancer Mother   . Prostate cancer Father   . Colon cancer Father   . Diabetes Maternal Aunt   . Diabetes Maternal Uncle   . Diabetes Paternal Aunt   . Stroke Paternal Aunt     > 65  . Heart disease Paternal Aunt   . Diabetes Paternal Uncle   . Breast cancer Maternal Aunt      X 2    Review of Systems  Constitutional: Positive for appetite change (decreased) and fatigue. Negative for fever.  Respiratory: Positive for cough (dry) and shortness of breath (with exertion). Negative for wheezing.   Cardiovascular: Positive for leg swelling. Negative for chest pain and palpitations.  Gastrointestinal: Positive for constipation. Negative for abdominal pain.    Neurological: Positive for dizziness (when she gets up sometimes) and headaches (the other day).       Objective:   Filed Vitals:   04/24/15 1355  BP: 164/88  Pulse: 98  Temp: 98 F (36.7 C)  Resp: 16   Filed Weights   04/24/15 1355  Weight: 376 lb (170.552 kg)   Body mass index is 58.88 kg/(m^2).   Physical Exam Constitutional: Appears well-developed and well-nourished. No distress.  Neck: Neck supple. No tracheal deviation present. No thyromegaly present.  No carotid bruit. No cervical adenopathy.   Cardiovascular: Normal rate, regular rhythm and normal heart sounds.   1/6 systolic murmur.   Pulmonary/Chest: Effort normal and breath sounds normal. No respiratory distress. No wheezes.  Ext: right leg unwrapped - 1 + swelling that has improved, mild erythema that does not look like cellulitis - just related to edema, approximate 2 inch ulcer on lateral aspect of leg with clear fluid drainage, left leg does not have any ulcers per patient and was not unwrapped       Assessment & Plan:    See Problem List for Assessment and Plan of chronic medical problems.  Follow up in 2 weeks, sooner if needed

## 2015-04-24 NOTE — Progress Notes (Signed)
Pre visit review using our clinic review tool, if applicable. No additional management support is needed unless otherwise documented below in the visit note. 

## 2015-04-26 ENCOUNTER — Telehealth: Payer: Self-pay | Admitting: Internal Medicine

## 2015-04-26 NOTE — Telephone Encounter (Signed)
Pt was evaluated for physical therapy and Verdis Frederickson from [facility] is requesting verbal orders for  1 x for this week and   2x 7wk  Strenthing, balance and gate training.  She can be reached at [phone]

## 2015-04-27 NOTE — Telephone Encounter (Signed)
ok 

## 2015-04-27 NOTE — Telephone Encounter (Signed)
Please advise 

## 2015-04-27 NOTE — Telephone Encounter (Signed)
Spoke with Verdis Frederickson to inform.

## 2015-04-29 ENCOUNTER — Telehealth: Payer: Self-pay | Admitting: Neurology

## 2015-04-29 ENCOUNTER — Telehealth (HOSPITAL_COMMUNITY): Payer: Self-pay | Admitting: Vascular Surgery

## 2015-04-29 ENCOUNTER — Telehealth: Payer: Self-pay | Admitting: *Deleted

## 2015-04-29 ENCOUNTER — Other Ambulatory Visit: Payer: Self-pay | Admitting: Emergency Medicine

## 2015-04-29 MED ORDER — GLUCOSE BLOOD VI STRP: ORAL_STRIP | Status: DC

## 2015-04-29 MED ORDER — ONETOUCH ULTRASOFT LANCETS MISC: Status: DC

## 2015-04-29 NOTE — Telephone Encounter (Signed)
Spoke to [employer] - she is going to have the home health nurse give her B12 injections while she under their care - approximately three more months.

## 2015-04-29 NOTE — Telephone Encounter (Signed)
Patient called to request that her B12 shots be given to her by the home health nurse that is coming out to see her instead of having them done in our office. Once home health stops, then she will resume getting B12 shots here in our office.

## 2015-04-29 NOTE — Telephone Encounter (Signed)
Left msg on triage needing rx sent to [facility] on her one touch strips & lancets. Called pt back no answer LMOM rx's sent to pharmacy...[name]

## 2015-04-29 NOTE — Telephone Encounter (Signed)
Returned pt call to verify if this is [place] office

## 2015-04-29 NOTE — Progress Notes (Deleted)
Patient ID: April [name], female   DOB: [birth year], 65 y.o.   MRN: [id number]    DATE: 04/18/15  Location:  [facility]    Place of Service: SNF (31)   Extended Emergency Contact Information Primary Emergency Contact: Meadowcroft,Dwight Address: [place], [place] Phone: [phone] Relation: Spouse  Advanced Directive information Does patient have an advance directive?: Yes, Type of Advance Directive: Living will, Does patient want to make changes to advanced directive?: No - Patient declined  Chief Complaint  Patient presents with  . New Admit To SNF    HPI:  65 yo female seen today as a new admission into SNF following hospital stay for b/l leg edema with cellulitis, HTN, chronic diastolic HF, hypoxic respiratory failure, morbid obesity, OSA, DM, asthma and PUD. 2D echo revealed no right HF but grade 1 diastolic dysfunction noted. CXR showed no acute process. She was diuresed aggressively with IV lasix and 4700cc removed. norvasc stopped. abx completed prior to d/c. She presents to SNF for short term rehab.  Today pt states she wishes to go home as she can do same exercises at home. No f/c. She is down 2 lbs since yesterday. No other concerns. No nursing issues. No falls  DM - diet controlled. A1c 5.9%  Hyperlipidemia - stable on simvastatin  CAD/HTN/HF - BP elevated. Currently takes verapamil, hydralazine, lasix and coreg  Peripheral neuropathy - stable on topamax, cymbalta and gabapentin  Asthma/OSA - stable on symbicort and proventil HFA  Anemia/B12 deficiency - stable on monthly B12 injections and folic acid  GERD - stable on omeprazole. Takes colace  Morbid obesity - due to excessive calories and sedentary lifestyle  Past Medical History  Diagnosis Date  . Meralgia paresthetica     Dr. [name]  . CTS (carpal tunnel syndrome)   . CAD (coronary artery disease)   . Osteoarthrosis, unspecified whether  generalized or localized, lower leg   . Diabetes mellitus   . Hypertension   . Hyperlipidemia   . Morbid obesity ([place])   . PUD (peptic ulcer disease)   . Unspecified hereditary and idiopathic peripheral neuropathy   . Morbid obesity ([place])   . Type II or unspecified type diabetes mellitus without mention of complication, not stated as uncontrolled   . Carpal tunnel syndrome   . Asthma   . Colon polyp, hyperplastic 2007 & 2012  . Anemia     Past Surgical History  Procedure Laterality Date  . Abdominal hysterectomy    . Dilation and curettage of uterus      multiple  . Tonsillectomy and adenoidectomy    . Cardiac catheterization  2002    non obstructive disease  . Renal calculi  12/1997    SVT with induction of anesthesia  . Colonoscopy with polypectomy  2007 & 2012     hyperplastic ;Dr Watt Climes    Patient Care Team: [name], MD as PCP - General (Internal Medicine)  Social History   Social History  . Marital Status: Married    Spouse Name: [name]  . Number of Children: 1  . Years of Education: BS   Occupational History  . Disabled    Social History Main Topics  . Smoking status: Never Smoker   . Smokeless tobacco: Never Used  . Alcohol Use: No  . Drug Use: No  . Sexual Activity: Not on file   Other Topics Concern  .  Not on file   Social History Narrative   Patient lives at home with her husband [name] Patient is retired and has a Conservation officer, nature.    Caffeine - some times.   Right handed.     reports that she has never smoked. She has never used smokeless tobacco. She reports that she does not drink alcohol or use illicit drugs.  Immunization History  Administered Date(s) Administered  . Influenza Split 12/03/2010, 11/03/2011  . Influenza Whole 11/16/2006, 11/22/2007, 10/31/2008, 10/29/2009  . Influenza,inj,Quad PF,36+ Mos 10/19/2012, 10/25/2013, 10/29/2014  . Pneumococcal Polysaccharide-23 03/24/2005, 01/04/2012  . Td 06/24/2009  . Tdap  10/09/2014  . Zoster 01/15/2012    Allergies  Allergen Reactions  . Penicillins Rash      . Benazepril Hcl Other (See Comments)    Unknown allergy  . Hydrochlorothiazide W-Triamterene     REACTION: hypokalemia  . Metronidazole Other (See Comments)    Unknown allergy  . Spironolactone     REACTION: kidney problems  . Sulfonamide Derivatives     REACTION: internal "burning"  . Torsemide Other (See Comments)    Unknown allergy  . Valsartan Other (See Comments)    Unknown allergy    Medications: Patient's Medications  65 MG TABLET 25 MG TABLET    Take 1 tablet (25 mg total) by mouth 2 (two) times daily with a meal.   DOCUSATE SODIUM (COLACE) 100 MG CAPSULE    Take 1 capsule (100 mg total) by mouth 2 (two) times daily as needed for mild constipation.  Previous Medications   ALBUTEROL (PROVENTIL HFA) 108 (90 BASE) MCG/ACT INHALER    Inhale 2 puffs into the lungs every 4 (four) hours as needed for wheezing or shortness of breath.   ASPIRIN (ECOTRIN LOW STRENGTH) 81 MG EC TABLET    Take 81 mg by mouth daily.     BUDESONIDE-FORMOTEROL (SYMBICORT) 160-4.5 MCG/ACT INHALER    one - two inhalations every 12 hours; gargle and spit after use   COD LIVER OIL CAPS    Take 2 capsules by mouth daily.   DULOXETINE (CYMBALTA) 60 MG CAPSULE    Take 1 capsule (60 mg total) by mouth daily.   FLAXSEED, LINSEED, (FLAX SEED OIL) 1000 MG CAPS    Take 1,000 mg by mouth 2 (two) times daily.    FOLIC ACID (FOLVITE) [id number] MCG TABLET    Take 800 mcg by mouth daily.   FUROSEMIDE (LASIX) 40 MG TABLET    Take 1 tablet (40 mg total) by mouth daily as needed for fluid or edema. Take 1-1.5 Tablets by mouth daily PRN pedal edema   GABAPENTIN (NEURONTIN) 300 MG CAPSULE    Take 1 capsule (300 mg total) by mouth 3 (three) times daily.   GLUCOSE BLOOD (ONE TOUCH ULTRA TEST) TEST STRIP    Use as instructed   HYDRALAZINE (APRESOLINE) 50 MG TABLET    Take 1 tab by mouth every 8 hrs   LANCETS  (ONETOUCH ULTRASOFT) LANCETS    Use as instructed   MULTIPLE VITAMIN (MULTIVITAMIN) TABLET    Take 1 tablet by mouth daily.     OMEGA-3 FATTY ACIDS (FISH OIL) 1000 MG CAPS    Take 1,000 mg by mouth daily.    OMEPRAZOLE (PRILOSEC) 40 MG CAPSULE    Take 1 capsule (40 mg total) by mouth 2 (two) times daily.   POTASSIUM CHLORIDE SA (K-DUR,KLOR-CON) 20 MEQ TABLET    Take 2 tablets (40 mEq total) by  mouth daily as needed (with Lasix).   SIMVASTATIN (ZOCOR) 40 MG TABLET    Take 1 tablet (40 mg total) by mouth daily. --- Please establish with new PCP for further refills   TOPIRAMATE (TOPAMAX) 100 MG TABLET    Take 1 tablet (100 mg total) by mouth 2 (two) times daily.  Modified Medications   Modified Medication Previous Medication   VERAPAMIL (VERELAN PM) 120 MG 24 HR CAPSULE verapamil (VERELAN PM) 120 MG 24 hr capsule      Take 1 capsule (120 mg total) by mouth daily.    Take 1 capsule (120 mg total) by mouth daily.  Discontinued Medications   BISACODYL (DULCOLAX) 10 MG SUPPOSITORY    Place 1 suppository (10 mg total) rectally daily as needed for moderate constipation.   CARVEDILOL (COREG) 12.5 MG TABLET    Give 25 mg every morning and Give 1 tablet by mouth every evening ( HTN)   CARVEDILOL (COREG) 25 MG TABLET    Take 0.5-1 tablets (12.5-25 mg total) by mouth 2 (two) times daily with a meal. TAKE 1 TABLET BY MOUTH EVERY MORNING AND ONE-HALF TAB EVERY EVENING    Review of Systems  Respiratory: Positive for shortness of breath.   Cardiovascular: Positive for leg swelling.  Skin: Positive for wound.  All other systems reviewed and are negative.   Filed Vitals:   04/18/15 1347  BP: 180/90  Pulse: 77  Temp: 98 F (36.7 C)  TempSrc: Oral  Resp: 20  Weight: 388 lb (175.996 kg)   Body mass index is 60.76 kg/(m^2).  Physical Exam  Constitutional: She is oriented to person, place, and time. She appears well-developed and well-nourished.  Sitting in bed in NAD  HENT:  Mouth/Throat: Oropharynx  is clear and moist. No oropharyngeal exudate.  Eyes: Pupils are equal, round, and reactive to light. No scleral icterus.  Neck: Neck supple. Carotid bruit is not present. No tracheal deviation present. No thyromegaly present.  Cardiovascular: Normal rate, regular rhythm and intact distal pulses.  Exam reveals no gallop and no friction rub.   Murmur (1/6 SEM) heard. B/l LE edema +2 pitting with chronic venous stasis changes. No calf TTP  Pulmonary/Chest: Effort normal and breath sounds normal. No stridor. No respiratory distress. She has no wheezes. She has no rales.  Abdominal: Soft. Bowel sounds are normal. She exhibits no distension and no mass. There is no hepatomegaly. There is no tenderness. There is no rebound and no guarding.  obese  Musculoskeletal: She exhibits edema.  Lymphadenopathy:    She has no cervical adenopathy.  Neurological: She is alert and oriented to person, place, and time.  Skin: Skin is warm and dry. No rash noted.  Right lateral leg vesicle intact. No d/c or bleeding  Psychiatric: She has a normal mood and affect. Judgment and thought content normal. She is aggressive.     Labs reviewed: Component Date Value Ref Range Status  . WBC 04/11/2015 5.0  4.0 - 10.5 K/uL  . RBC 04/11/2015 4.14  3.87 - 5.11 MIL/uL  . Hemoglobin 04/11/2015 12.1  12.0 - 15.0 g/dL  . HCT 04/11/2015 39.2  36.0 - 46.0 %  . MCV 04/11/2015 94.7  78.0 - 100.0 fL  . MCH 04/11/2015 29.2  26.0 - 34.0 pg  . MCHC 04/11/2015 30.9  30.0 - 36.0 g/dL  . RDW 04/11/2015 15.6* 11.5 - 15.5 %  . Platelets 04/11/2015 164  150 - 400 K/uL  . Sodium 04/11/2015 143  135 - 145  mmol/L  . Potassium 04/11/2015 3.6  3.5 - 5.1 mmol/L  . Chloride 04/11/2015 107  101 - 111 mmol/L  . CO2 04/11/2015 29  22 - 32 mmol/L  . Glucose, Bld 04/11/2015 91  65 - 99 mg/dL  . BUN 04/11/2015 19  6 - 20 mg/dL  . Creatinine, Ser 04/11/2015 1.19* 0.44 - 1.00 mg/dL  . Calcium 04/11/2015 8.8* 8.9 - 10.3 mg/dL  . Total Protein  04/11/2015 7.1  6.5 - 8.1 g/dL  . Albumin 04/11/2015 3.7  3.5 - 5.0 g/dL  . AST 04/11/2015 16  15 - 41 U/L  . ALT 04/11/2015 13* 14 - 54 U/L  . Alkaline Phosphatase 04/11/2015 68  38 - 126 U/L  . Total Bilirubin 04/11/2015 0.6  0.3 - 1.2 mg/dL  . GFR calc non Af Amer 04/11/2015 47* >60 mL/min  . GFR calc Af Amer 04/11/2015 55* >60 mL/min    . Anion gap 04/11/2015 7  5 - 15  . B Natriuretic Peptide 04/11/2015 75.2  0.0 - 100.0 pg/mL  . Color, Urine 04/12/2015 AMBER YELLOW  . APPearance 04/12/2015 CLEAR  CLEAR  . Specific Gravity, Urine 04/12/2015 1.020  1.005 - 1.030  . pH 04/12/2015 5.0  5.0 - 8.0  . Glucose, UA 04/12/2015 NEGATIVE  NEGATIVE mg/dL  . Hgb urine dipstick 04/12/2015 NEGATIVE  NEGATIVE  . Bilirubin Urine 04/12/2015 NEGATIVE  NEGATIVE  . Ketones, ur 04/12/2015 NEGATIVE  NEGATIVE mg/dL  . Protein, ur 04/12/2015 NEGATIVE  NEGATIVE mg/dL  . Nitrite 04/12/2015 NEGATIVE  NEGATIVE  . Leukocytes, UA 04/12/2015 NEGATIVE  NEGATIVE  . Weight 04/13/2015 6165.83    . Height 04/13/2015 67    . BP 04/13/2015 115/88    . pH, Ven 04/11/2015 7.367* 7.250 - 7.300  . pCO2, Ven 04/11/2015 47.4  45.0 - 50.0 mmHg  . pO2, Ven 04/11/2015 Please note change in reference range.  31.0 - 45.0 mmHg  . Bicarbonate 04/11/2015 26.6* 20.0 - 24.0 mEq/L  . TCO2 04/11/2015 24.8  0 - 100 mmol/L  . Acid-Base Excess 04/11/2015 1.3  0.0 - 2.0 mmol/L  . O2 Saturation 04/11/2015 55.6    . Patient temperature 04/11/2015 98.2    . Collection site 04/11/2015 VEIN    . Drawn by 04/11/2015 [id number]    . Sample type 04/11/2015 VEIN    . WBC 04/13/2015 5.7  4.0 - 10.5 K/uL  . RBC 04/13/2015 3.85* 3.87 - 5.11 MIL/uL  . Hemoglobin 04/13/2015 11.1* 12.0 - 15.0 g/dL  . HCT 04/13/2015 37.8  36.0 - 46.0 %  . MCV 04/13/2015 98.2  78.0 - 100.0 fL  . MCH 04/13/2015 28.8  26.0 - 34.0 pg  . MCHC 04/13/2015 29.4* 30.0 - 36.0 g/dL  . RDW 04/13/2015 15.9* 11.5 - 15.5 %  . Platelets 04/13/2015 181  150 - 400 K/uL  . Sodium  04/13/2015 144  135 - 145 mmol/L  . Potassium 04/13/2015 3.8  3.5 - 5.1 mmol/L  . Chloride 04/13/2015 104  101 - 111 mmol/L  . CO2 04/13/2015 32  22 - 32 mmol/L  . Glucose, Bld 04/13/2015 131* 65 - 99 mg/dL  . BUN 04/13/2015 14  6 - 20 mg/dL  . Creatinine, Ser 04/13/2015 1.19* 0.44 - 1.00 mg/dL  . Calcium 04/13/2015 8.6* 8.9 - 10.3 mg/dL  . GFR calc non Af Amer 04/13/2015 47* >60 mL/min  . GFR calc Af Amer 04/13/2015 55* >60 mL/min  . Anion gap 04/13/2015 8  5 - 15  . WBC 04/14/2015  7.3  4.0 - 10.5 K/uL  . RBC 04/14/2015 4.14  3.87 - 5.11 MIL/uL  . Hemoglobin 04/14/2015 11.6* 12.0 - 15.0 g/dL  . HCT 04/14/2015 38.7  36.0 - 46.0 %  . MCV 04/14/2015 93.5  78.0 - 100.0 fL  . MCH 04/14/2015 28.0  26.0 - 34.0 pg  . MCHC 04/14/2015 30.0  30.0 - 36.0 g/dL  . RDW 04/14/2015 15.3  11.5 - 15.5 %  . Platelets 04/14/2015 175  150 - 400 K/uL  . Sodium 04/14/2015 141  135 - 145 mmol/L  . Potassium 04/14/2015 4.2  3.5 - 5.1 mmol/L  . Chloride 04/14/2015 101  101 - 111 mmol/L  . CO2 04/14/2015 28  22 - 32 mmol/L  . Glucose, Bld 04/14/2015 156* 65 - 99 mg/dL  . BUN 04/14/2015 20  6 - 20 mg/dL  . Creatinine, Ser 04/14/2015 1.48* 0.44 - 1.00 mg/dL  . Calcium 04/14/2015 8.4* 8.9 - 10.3 mg/dL  . GFR calc non Af Amer 04/14/2015 36* >60 mL/min  . GFR calc Af Amer 04/14/2015 42* >60 mL/min  . Anion gap 04/14/2015 12  5 - 15  . WBC 04/15/2015 5.9  4.0 - 10.5 K/uL  . RBC 04/15/2015 3.74* 3.87 - 5.11 MIL/uL  . Hemoglobin 04/15/2015 10.9* 12.0 - 15.0 g/dL  . HCT 04/15/2015 34.8* 36.0 - 46.0 %  . MCV 04/15/2015 93.0  78.0 - 100.0 fL  . MCH 04/15/2015 29.1  26.0 - 34.0 pg  . MCHC 04/15/2015 31.3  30.0 - 36.0 g/dL  . RDW 04/15/2015 15.2  11.5 - 15.5 %  . Platelets 04/15/2015 140* 150 - 400 K/uL  . Sodium 04/15/2015 138  135 - 145 mmol/L  . Potassium 04/15/2015 3.8  3.5 - 5.1 mmol/L  . Chloride 04/15/2015 100* 101 - 111 mmol/L  . CO2 04/15/2015 31  22 - 32 mmol/L  . Glucose, Bld 04/15/2015 135* 65 - 99  mg/dL  . BUN 04/15/2015 26* 6 - 20 mg/dL  . Creatinine, Ser 04/15/2015 1.36* 0.44 - 1.00 mg/dL  . Calcium 04/15/2015 8.4* 8.9 - 10.3 mg/dL  . GFR calc non Af Amer 04/15/2015 40* >60 mL/min  . GFR calc Af Amer 04/15/2015 47* >60 mL/min    . Anion gap 04/15/2015 7  5 - 15  . Vancomycin Tr 04/15/2015 19  10.0 - 20.0 ug/mL  . WBC 04/16/2015 6.7  4.0 - 10.5 K/uL  . RBC 04/16/2015 3.65* 3.87 - 5.11 MIL/uL  . Hemoglobin 04/16/2015 10.3* 12.0 - 15.0 g/dL  . HCT 04/16/2015 33.9* 36.0 - 46.0 %  . MCV 04/16/2015 92.9  78.0 - 100.0 fL  . MCH 04/16/2015 28.2  26.0 - 34.0 pg  . MCHC 04/16/2015 30.4  30.0 - 36.0 g/dL  . RDW 04/16/2015 14.9  11.5 - 15.5 %  . Platelets 04/16/2015 153  150 - 400 K/uL  . Sodium 04/16/2015 140  135 - 145 mmol/L  . Potassium 04/16/2015 4.0  3.5 - 5.1 mmol/L  . Chloride 04/16/2015 100* 101 - 111 mmol/L  . CO2 04/16/2015 32  22 - 32 mmol/L  . Glucose, Bld 04/16/2015 103* 65 - 99 mg/dL  . BUN 04/16/2015 21* 6 - 20 mg/dL  . Creatinine, Ser 04/16/2015 1.12* 0.44 - 1.00 mg/dL  . Calcium 04/16/2015 8.5* 8.9 - 10.3 mg/dL  . GFR calc non Af Amer 04/16/2015 51* >60 mL/min  . GFR calc Af Amer 04/16/2015 59* >60 mL/min    . Anion gap 04/16/2015 8  5 - 15  .  Streptococcus, Group A Screen  04/16/2015 NEGATIVE  NEGATIVE  . Specimen Description 04/16/2015 THROAT    . Special Requests 04/16/2015 NONE Reflexed from [id number]     Dg Chest 2 View  04/11/2015  CLINICAL DATA:  Leg edema and pain with swelling. Hypertension diabetes. EXAM: CHEST  2 VIEW COMPARISON:  09/05/2013 FINDINGS: Atherosclerotic aortic arch. Thoracic spondylosis. Mild enlargement of the cardiopericardial silhouette with indistinct upper zone pulmonary vasculature. No overt edema. No blunting of the costophrenic angles. Body habitus reduces diagnostic sensitivity and specificity. IMPRESSION: 1. Mild enlargement of the cardiopericardial silhouette with pulmonary venous hypertension but no overt edema. 2. Thoracic  spondylosis. Electronically Signed   By: Van Clines M.D.   On: 04/11/2015 20:15   Dg Abd 1 View  04/12/2015  CLINICAL DATA:  Nausea, vomiting. EXAM: ABDOMEN - 1 VIEW COMPARISON:  None. FINDINGS: The bowel gas pattern is normal. Phleboliths are noted in the pelvis. IMPRESSION: There is no evidence of bowel obstruction or ileus. Electronically Signed   By: Marijo Conception, M.D.   On: 04/12/2015 16:37   Dg Chest Port 1 View  04/16/2015  CLINICAL DATA:  Lower leg swelling, history of morbid obesity diabetes and hypertension, hypoxia EXAM: PORTABLE CHEST 1 VIEW COMPARISON:  04/11/2015 FINDINGS: Moderate cardiac enlargement. Vascular pattern normal. No focal consolidation or effusion. IMPRESSION: No active disease. Electronically Signed   By: Skipper Cliche M.D.   On: 04/16/2015 12:51     Assessment/Plan   ICD-9-CM ICD-10-CM   1. Essential hypertension - suboptimally controlled 401.9 I10   2. Bilateral leg edema - improved 782.3 R60.0   3. Chronic diastolic heart failure (HCC) 428.32 I50.32   4. Hyperlipidemia 272.4 E78.5   5. Morbid obesity with BMI of 50.0-59.9, adult ([place]) 278.01 E66.01    V85.43 Z68.43   6. Hypoxemic respiratory failure, chronic (HCC) 518.83 J96.11    799.02    7. Hereditary and idiopathic peripheral neuropathy 356.9 G60.9   8. Type 2 diabetes mellitus with diabetic neuropathy, without long-term current use of insulin (HCC) - diet controlled 250.60 E11.40    357.2    9. Intrinsic asthma, mild intermittent, uncomplicated [id number]   10. Vitamin B12 deficiency 266.2 E53.8     VS qshift to follow BP. norvasc was stopped prior to hospital d/c as she is also taking verapamil. May need to increase coreg  Patient is being discharged with home health services: PT/OT/Nursing for wound care and HTN   Patient is being discharged with the following durable medical equipment:  none  Patient has been advised to f/u with their PCP in 1-2 weeks to bring them up to date on  their rehab stay.  They were provided with a 30 day supply of scripts for prescription medications and refills must be obtained from their PCP.  Rexford Prevo S. Perlie Gold  [facility] [address] [place], [place] [address] [phone] Cell (Monday-Friday 8 AM - 5 PM) [phone] After 5 PM and follow prompts

## 2015-05-02 ENCOUNTER — Telehealth: Payer: Self-pay | Admitting: Neurology

## 2015-05-02 LAB — HM DIABETES EYE EXAM

## 2015-05-02 NOTE — Telephone Encounter (Signed)
Patient called, wants to know if Rx for B12 (for 2 months) was sent to [facility] will be doing B12 shots.

## 2015-05-02 NOTE — Telephone Encounter (Signed)
Patient also wants to know if syringes will be included with that Rx along with anything else that would be needed.

## 2015-05-03 ENCOUNTER — Telehealth: Payer: Self-pay | Admitting: Emergency Medicine

## 2015-05-03 ENCOUNTER — Encounter: Payer: Self-pay | Admitting: Internal Medicine

## 2015-05-03 NOTE — Telephone Encounter (Signed)
Spoke with [facility] nurse to clarify medications for pt.

## 2015-05-03 NOTE — Telephone Encounter (Signed)
Patient called back to check status of B12, syringes and anything else needed for B12 shots to be done at home by [facility] home [facility]. States nurse Sharyn Lull was going to take care of this on Wednesday 05/01/15, [facility] [address] still doesn't have the Rx.

## 2015-05-03 NOTE — Telephone Encounter (Signed)
Spoke with [employer], Merrilee Seashore who stated prescription for Vit B12 injections not received. He advised to order syringes as well because insurance will sometimes pay for them if ordered.   Called patient and nformed her that order was not placed; message will be sent to Dr [name], and order can be placed Monday. She stated she'll have [facility] coming Mon and Thurs to do wound care. She verbalized understanding, appreciation.

## 2015-05-06 ENCOUNTER — Other Ambulatory Visit: Payer: Self-pay | Admitting: *Deleted

## 2015-05-06 MED ORDER — "NEEDLE (DISP) 23G X 1-1/2"" MISC": Status: DC

## 2015-05-06 MED ORDER — CYANOCOBALAMIN 1000 MCG/ML IJ SOLN: 1000.0000 ug | INTRAMUSCULAR | Status: DC

## 2015-05-06 MED ORDER — "SYRINGE/NEEDLE (DISP) 23G X 1"" 3 ML MISC": Status: DC

## 2015-05-06 NOTE — Telephone Encounter (Signed)
Rx sent for home health nurse to administer injections for the next two months.

## 2015-05-07 ENCOUNTER — Ambulatory Visit: Payer: Medicare Other

## 2015-05-08 ENCOUNTER — Encounter: Payer: Self-pay | Admitting: Internal Medicine

## 2015-05-08 ENCOUNTER — Ambulatory Visit (INDEPENDENT_AMBULATORY_CARE_PROVIDER_SITE_OTHER): Payer: Medicare Other | Admitting: Internal Medicine

## 2015-05-08 ENCOUNTER — Other Ambulatory Visit (INDEPENDENT_AMBULATORY_CARE_PROVIDER_SITE_OTHER): Payer: Medicare Other

## 2015-05-08 VITALS — BP 156/86 | HR 80 | Temp 98.4°F | Resp 18 | Wt 380.0 lb

## 2015-05-08 DIAGNOSIS — R600 Localized edema: Secondary | ICD-10-CM

## 2015-05-08 DIAGNOSIS — I10 Essential (primary) hypertension: Secondary | ICD-10-CM

## 2015-05-08 DIAGNOSIS — D649 Anemia, unspecified: Secondary | ICD-10-CM

## 2015-05-08 DIAGNOSIS — E1140 Type 2 diabetes mellitus with diabetic neuropathy, unspecified: Secondary | ICD-10-CM

## 2015-05-08 LAB — CBC WITH DIFFERENTIAL/PLATELET
BASOS ABS: 0 10*3/uL (ref 0.0–0.1)
Basophils Relative: 0.9 % (ref 0.0–3.0)
Eosinophils Absolute: 0.1 10*3/uL (ref 0.0–0.7)
Eosinophils Relative: 2.5 % (ref 0.0–5.0)
HCT: 34.6 % — ABNORMAL LOW (ref 36.0–46.0)
HEMOGLOBIN: 11 g/dL — AB (ref 12.0–15.0)
LYMPHS ABS: 1.3 10*3/uL (ref 0.7–4.0)
Lymphocytes Relative: 24 % (ref 12.0–46.0)
MCHC: 31.9 g/dL (ref 30.0–36.0)
MCV: 89.8 fl (ref 78.0–100.0)
MONOS PCT: 9.6 % (ref 3.0–12.0)
Monocytes Absolute: 0.5 10*3/uL (ref 0.1–1.0)
NEUTROS PCT: 63 % (ref 43.0–77.0)
Neutro Abs: 3.3 10*3/uL (ref 1.4–7.7)
Platelets: 143 10*3/uL — ABNORMAL LOW (ref 150.0–400.0)
RBC: 3.85 Mil/uL — AB (ref 3.87–5.11)
RDW: 16.1 % — ABNORMAL HIGH (ref 11.5–15.5)
WBC: 5.3 10*3/uL (ref 4.0–10.5)

## 2015-05-08 LAB — COMPREHENSIVE METABOLIC PANEL
ALBUMIN: 3.6 g/dL (ref 3.5–5.2)
ALK PHOS: 47 U/L (ref 39–117)
ALT: 8 U/L (ref 0–35)
AST: 12 U/L (ref 0–37)
BILIRUBIN TOTAL: 0.6 mg/dL (ref 0.2–1.2)
BUN: 19 mg/dL (ref 6–23)
CALCIUM: 9.4 mg/dL (ref 8.4–10.5)
CO2: 29 mEq/L (ref 19–32)
Chloride: 110 mEq/L (ref 96–112)
Creatinine, Ser: 0.94 mg/dL (ref 0.40–1.20)
GFR: 76.88 mL/min (ref >60.00)
GLUCOSE: 92 mg/dL (ref 70–99)
POTASSIUM: 3.6 meq/L (ref 3.5–5.1)
Sodium: 145 mEq/L (ref 135–145)
TOTAL PROTEIN: 6.5 g/dL (ref 6.0–8.3)

## 2015-05-08 LAB — IRON: Iron: 51 ug/dL (ref 42–145)

## 2015-05-08 LAB — FERRITIN: FERRITIN: 40.1 ng/mL (ref 10.0–291.0)

## 2015-05-08 LAB — TSH: TSH: 1.85 u[IU]/mL (ref 0.35–4.50)

## 2015-05-08 MED ORDER — POTASSIUM CHLORIDE CRYS ER 20 MEQ PO TBCR: 20.0000 meq | EXTENDED_RELEASE_TABLET | Freq: Every day | ORAL | Status: DC

## 2015-05-08 MED ORDER — FUROSEMIDE 40 MG PO TABS: 40.0000 mg | ORAL_TABLET | Freq: Every day | ORAL | Status: DC

## 2015-05-08 NOTE — Assessment & Plan Note (Signed)
Lab Results  Component Value Date   HGBA1C 5.9 04/10/2015    Diabetes well controlled

## 2015-05-08 NOTE — Progress Notes (Signed)
Subjective:    Patient ID: April [name], female    DOB: Mar 19, 1950, 65 y.o.   MRN: [id number]  HPI She is here for a two week follow up.   Hypertension: She is taking her medication daily. She is compliant with a low sodium diet.  She denies chest pain and regular headaches. She still has SOB and palpitations.  Her leg edema is worse.  She is working on increasing her activity at home - she is doing PT.  She feels this is helping.  She does monitor her blood pressure at home and PT monitors it and it has been controlled, 120-130/70-80's.    Leg edema: She still has leg swelling.  The swelling might be increasing slightly per her and her husband.  Her husband has been wrapping her legs.  She is currently taking 40 mg twice a week.  Her last dose was two days ago.  She denies redness in the legs.  The ulcer on her right lower leg is healing.  The visiting nurse is still coming.   Medications and allergies reviewed with patient and updated if appropriate.  Patient Active Problem List   Diagnosis Date Noted  . Leg ulcer ([place]) 04/24/2015  . Diabetes mellitus with neurological manifestations ([place]) 04/18/2015  . Bilateral leg edema 04/11/2015  . Cellulitis of both lower extremities 04/11/2015  . Abnormality of gait 01/03/2015  . Hereditary and idiopathic peripheral neuropathy 01/03/2015  . GERD (gastroesophageal reflux disease) 06/17/2014  . Morbid obesity with BMI of 50.0-59.9, adult ([place]) 09/05/2013  . Hx of colonic polyps 12/15/2012  . Vitamin B12 deficiency 11/03/2012  . Intrinsic asthma 03/23/2012  . Chronic diastolic heart failure ([place]) 02/20/2011  . OSA (obstructive sleep apnea) 09/17/2010  . URINARY URGENCY 01/08/2010  . CAD, NATIVE VESSEL 11/20/2008  . OSTEOARTHRITIS, KNEES, BILATERAL, SEVERE 06/14/2008  . Hyperlipidemia 05/10/2007  . Essential hypertension 01/18/2007  . HYPOKALEMIA 04/30/2006  . HX, PERSONAL, PEPTIC ULCER DISEASE 04/30/2006    Current Outpatient  Prescriptions on File Prior to Visit  Medication Sig Dispense Refill  . aspirin (ECOTRIN LOW STRENGTH) 81 MG EC tablet Take 81 mg by mouth daily.      . budesonide-formoterol (SYMBICORT) 160-4.5 MCG/ACT inhaler one - two inhalations every 12 hours; gargle and spit after use 1 Inhaler 4  . carvedilol (COREG) 25 MG tablet Take 1 tablet (25 mg total) by mouth 2 (two) times daily with a meal. 60 tablet 3  . Cod Liver Oil CAPS Take 2 capsules by mouth daily.    . cyanocobalamin (,VITAMIN B-12,) 1000 MCG/ML injection Inject 1 mL (1,000 mcg total) into the muscle every 30 (thirty) days. 2 mL 0  . docusate sodium (COLACE) 100 MG capsule Take 1 capsule (100 mg total) by mouth 2 (two) times daily as needed for mild constipation. 60 capsule 0  . DULoxetine (CYMBALTA) 60 MG capsule Take 1 capsule (60 mg total) by mouth daily. 30 capsule 6  . Flaxseed, Linseed, (FLAX SEED OIL) 1000 MG CAPS Take 1,000 mg by mouth 2 (two) times daily.     . folic acid (FOLVITE) Q000111Q MCG tablet Take 800 mcg by mouth daily.    . furosemide (LASIX) 40 MG tablet Take 1 tablet (40 mg total) by mouth daily as needed for fluid or edema. Take 1-1.5 Tablets by mouth daily PRN pedal edema 90 tablet 0  . gabapentin (NEURONTIN) 300 MG capsule Take 1 capsule (300 mg total) by mouth 3 (three) times daily. 90 capsule  6  . glucose blood (ONE TOUCH ULTRA TEST) test strip Use to check blood sugars twice a day Dx E11.9 100 each 3  . hydrALAZINE (APRESOLINE) 50 MG tablet Take 1 tab by mouth every 8 hrs 270 tablet 1  . Lancets (ONETOUCH ULTRASOFT) lancets Use to help check blood sugars twice a day Dx E11.9 100 each 3  . Multiple Vitamin (MULTIVITAMIN) tablet Take 1 tablet by mouth daily.      Marland Kitchen NEEDLE, DISP, 23 G (EASY TOUCH FLIPLOCK NEEDLES) 23G X 1-1/2" MISC Use to inject B12 once monthly. 2 each 0  . Omega-3 Fatty Acids (FISH OIL) 1000 MG CAPS Take 1,000 mg by mouth daily.     Marland Kitchen omeprazole (PRILOSEC) 40 MG capsule Take 1 capsule (40 mg total) by  mouth 2 (two) times daily. 60 capsule 6  . potassium chloride SA (K-DUR,KLOR-CON) 20 MEQ tablet Take 2 tablets (40 mEq total) by mouth daily as needed (with Lasix).    Marland Kitchen simvastatin (ZOCOR) 40 MG tablet Take 1 tablet (40 mg total) by mouth daily. --- Please establish with new PCP for further refills 90 tablet 1  . SYRINGE-NEEDLE, DISP, 3 ML 23G X 1" 3 ML MISC Supply to draw up B-12 from vial.  Separate needle sent in for injection. 2 each 0  . topiramate (TOPAMAX) 100 MG tablet Take 1 tablet (100 mg total) by mouth 2 (two) times daily. 60 tablet 11  . verapamil (VERELAN PM) 120 MG 24 hr capsule Take 1 capsule (120 mg total) by mouth daily. 90 capsule 1  . albuterol (PROVENTIL HFA) 108 (90 BASE) MCG/ACT inhaler Inhale 2 puffs into the lungs every 4 (four) hours as needed for wheezing or shortness of breath. 1 Inhaler 4   No current facility-administered medications on file prior to visit.    Past Medical History  Diagnosis Date  . Meralgia paresthetica     Dr. [name]  . CTS (carpal tunnel syndrome)   . CAD (coronary artery disease)   . Osteoarthrosis, unspecified whether generalized or localized, lower leg   . Diabetes mellitus   . Hypertension   . Hyperlipidemia   . Morbid obesity ([place])   . PUD (peptic ulcer disease)   . Unspecified hereditary and idiopathic peripheral neuropathy   . Morbid obesity ([place])   . Type II or unspecified type diabetes mellitus without mention of complication, not stated as uncontrolled   . Carpal tunnel syndrome   . Asthma   . Colon polyp, hyperplastic 2007 & 2012  . Anemia     Past Surgical History  Procedure Laterality Date  . Abdominal hysterectomy    . Dilation and curettage of uterus      multiple  . Tonsillectomy and adenoidectomy    . Cardiac catheterization  2002    non obstructive disease  . Renal calculi  12/1997    SVT with induction of anesthesia  . Colonoscopy with polypectomy  2007 & 2012     hyperplastic ;Dr Watt Climes    Social History     Social History  . Marital Status: Married    Spouse Name: [name]  . Number of Children: 1  . Years of Education: BS   Occupational History  . Disabled    Social History Main Topics  . Smoking status: Never Smoker   . Smokeless tobacco: Never Used  . Alcohol Use: No  . Drug Use: No  . Sexual Activity: Not on file   Other Topics Concern  . Not on  file   Social History Narrative   Patient lives at home with her husband [name] Patient is retired and has a Conservation officer, nature.    Caffeine - some times.   Right handed.    Family History  Problem Relation Age of Onset  . Colon cancer Mother   . Prostate cancer Father   . Colon cancer Father   . Diabetes Maternal Aunt   . Diabetes Maternal Uncle   . Diabetes Paternal Aunt   . Stroke Paternal Aunt     > 65  . Heart disease Paternal Aunt   . Diabetes Paternal Uncle   . Breast cancer Maternal Aunt      X 2    Review of Systems  Constitutional: Negative for fever.  Respiratory: Positive for cough (dry), shortness of breath (with exertion) and wheezing.   Cardiovascular: Positive for palpitations and leg swelling. Negative for chest pain.  Neurological: Positive for dizziness (when standing). Negative for light-headedness and headaches.       Objective:   Filed Vitals:   05/08/15 1450  BP: 156/86  Pulse: 80  Temp: 98.4 F (36.9 C)  Resp: 18   Filed Weights   05/08/15 1450  Weight: 380 lb (172.367 kg)   Body mass index is 59.5 kg/(m^2).   Physical Exam Constitutional: Appears well-developed and well-nourished. No distress.  Neck: Neck supple. No tracheal deviation present. No thyromegaly present.  No carotid bruit. No cervical adenopathy.   Cardiovascular: Normal rate, regular rhythm and normal heart sounds.   No murmur heard.  2+ edema with chronic skin changes. Ulcer not seen but improving her patient and husband Pulmonary/Chest: Effort normal and breath sounds normal. No respiratory distress. No  wheezes.       Assessment & Plan:   See Problem List for Assessment and Plan of chronic medical problems.   Follow up in one month, sooner if needed

## 2015-05-08 NOTE — Assessment & Plan Note (Signed)
Edema increasing again with lasix 40 mg twice a week Increase to lasix 40mg  daily, restart potassium (ran out) - take one tablet daily Check cmp today and will need to monitor closely Recheck in one month

## 2015-05-08 NOTE — Patient Instructions (Addendum)
  Test(s) ordered today. Your results will be released to Kim (or called to you) after review, usually within 72hours after test completion. If any changes need to be made, you will be notified at that same time.   Medications reviewed and updated.  Changes include increasing the lasix to 40 mg daily.  Restart the potassium at one pill daily.   Your prescription(s) have been submitted to your pharmacy. Please take as directed and contact our office if you believe you are having problem(s) with the medication(s).   Please followup in one month

## 2015-05-08 NOTE — Assessment & Plan Note (Signed)
Elevated here today, but appears to be well controlled at home Continue current medications Monitor closely Check cmp Work in increasing activity

## 2015-05-16 ENCOUNTER — Other Ambulatory Visit: Payer: Self-pay

## 2015-05-16 DIAGNOSIS — I10 Essential (primary) hypertension: Secondary | ICD-10-CM

## 2015-05-16 MED ORDER — HYDRALAZINE HCL 50 MG PO TABS
ORAL_TABLET | ORAL | Status: DC
Start: 2015-05-16 — End: 2015-12-09

## 2015-05-21 ENCOUNTER — Encounter (HOSPITAL_COMMUNITY): Payer: Self-pay | Admitting: *Deleted

## 2015-05-23 ENCOUNTER — Telehealth: Payer: Self-pay | Admitting: Internal Medicine

## 2015-05-23 ENCOUNTER — Ambulatory Visit (AMBULATORY_SURGERY_CENTER): Payer: Self-pay | Admitting: *Deleted

## 2015-05-23 VITALS — Ht 64.0 in | Wt 376.0 lb

## 2015-05-23 DIAGNOSIS — Z86010 Personal history of colonic polyps: Secondary | ICD-10-CM

## 2015-05-23 MED ORDER — NA SULFATE-K SULFATE-MG SULF 17.5-3.13-1.6 GM/177ML PO SOLN: 1.0000 | Freq: Once | ORAL | Status: DC

## 2015-05-23 NOTE — Progress Notes (Signed)
No egg or soy allergy. No anesthesia problems.  No home O2.  No diet meds.  Pt currently has cellulitis on lower extremities.

## 2015-05-23 NOTE — Telephone Encounter (Signed)
Pt wanted to know if she could take stool softeners the 5 days prior to her procedure. Discussed with pt that she can take stool softeners. Pts questions were answered.

## 2015-05-27 ENCOUNTER — Telehealth: Payer: Self-pay

## 2015-05-27 DIAGNOSIS — I110 Hypertensive heart disease with heart failure: Secondary | ICD-10-CM | POA: Diagnosis not present

## 2015-05-27 DIAGNOSIS — M170 Bilateral primary osteoarthritis of knee: Secondary | ICD-10-CM

## 2015-05-27 DIAGNOSIS — R238 Other skin changes: Secondary | ICD-10-CM | POA: Diagnosis not present

## 2015-05-27 DIAGNOSIS — I5032 Chronic diastolic (congestive) heart failure: Secondary | ICD-10-CM | POA: Diagnosis not present

## 2015-05-27 DIAGNOSIS — E119 Type 2 diabetes mellitus without complications: Secondary | ICD-10-CM

## 2015-05-27 NOTE — Telephone Encounter (Signed)
Paperwork signed, faxed, copy sent to scan 

## 2015-05-27 NOTE — Telephone Encounter (Signed)
Home Health Cert/Plan of Care received (04/23/2015 - 06/21/2015) and placed on MD's desk for signature

## 2015-06-03 ENCOUNTER — Telehealth: Payer: Self-pay | Admitting: Internal Medicine

## 2015-06-03 NOTE — Telephone Encounter (Signed)
Pt wanted to know if she can take her regular meds after the colon tomorrow. Discussed with pt that she can take them after her colon.

## 2015-06-04 ENCOUNTER — Ambulatory Visit: Payer: Medicare Other

## 2015-06-04 ENCOUNTER — Ambulatory Visit (HOSPITAL_COMMUNITY): Payer: Medicare Other | Admitting: Anesthesiology

## 2015-06-04 ENCOUNTER — Encounter (HOSPITAL_COMMUNITY)
Admission: RE | Disposition: A | Discharge status: Home / Self Care | Payer: Self-pay | Source: Ambulatory Visit | Attending: Internal Medicine

## 2015-06-04 ENCOUNTER — Ambulatory Visit (HOSPITAL_COMMUNITY)
Admission: RE | Admit: 2015-06-04 | Discharge: 2015-06-04 | Disposition: A | Discharge status: Home / Self Care | Payer: Medicare Other | Source: Ambulatory Visit | Attending: Internal Medicine | Admitting: Internal Medicine

## 2015-06-04 ENCOUNTER — Encounter (HOSPITAL_COMMUNITY): Payer: Self-pay | Admitting: Internal Medicine

## 2015-06-04 DIAGNOSIS — E119 Type 2 diabetes mellitus without complications: Secondary | ICD-10-CM | POA: Diagnosis not present

## 2015-06-04 DIAGNOSIS — G4730 Sleep apnea, unspecified: Secondary | ICD-10-CM | POA: Insufficient documentation

## 2015-06-04 DIAGNOSIS — K219 Gastro-esophageal reflux disease without esophagitis: Secondary | ICD-10-CM | POA: Insufficient documentation

## 2015-06-04 DIAGNOSIS — Z1211 Encounter for screening for malignant neoplasm of colon: Secondary | ICD-10-CM | POA: Diagnosis present

## 2015-06-04 DIAGNOSIS — Z8711 Personal history of peptic ulcer disease: Secondary | ICD-10-CM | POA: Insufficient documentation

## 2015-06-04 DIAGNOSIS — Z800 Family history of malignant neoplasm of digestive organs: Secondary | ICD-10-CM | POA: Diagnosis not present

## 2015-06-04 DIAGNOSIS — K648 Other hemorrhoids: Secondary | ICD-10-CM | POA: Insufficient documentation

## 2015-06-04 DIAGNOSIS — Z860100 Personal history of colon polyps, unspecified: Secondary | ICD-10-CM | POA: Insufficient documentation

## 2015-06-04 DIAGNOSIS — G609 Hereditary and idiopathic neuropathy, unspecified: Secondary | ICD-10-CM | POA: Insufficient documentation

## 2015-06-04 DIAGNOSIS — Z6841 Body Mass Index (BMI) 40.0 and over, adult: Secondary | ICD-10-CM | POA: Diagnosis not present

## 2015-06-04 DIAGNOSIS — E785 Hyperlipidemia, unspecified: Secondary | ICD-10-CM | POA: Diagnosis not present

## 2015-06-04 DIAGNOSIS — I10 Essential (primary) hypertension: Secondary | ICD-10-CM | POA: Diagnosis not present

## 2015-06-04 DIAGNOSIS — Z79899 Other long term (current) drug therapy: Secondary | ICD-10-CM | POA: Diagnosis not present

## 2015-06-04 DIAGNOSIS — I2510 Atherosclerotic heart disease of native coronary artery without angina pectoris: Secondary | ICD-10-CM | POA: Diagnosis not present

## 2015-06-04 HISTORY — DX: Dermatitis, unspecified: L30.9

## 2015-06-04 HISTORY — DX: Heart failure, unspecified: I50.9

## 2015-06-04 HISTORY — DX: Adverse effect of unspecified anesthetic, initial encounter: T41.45XA

## 2015-06-04 HISTORY — DX: Deficiency of other specified B group vitamins: E53.8

## 2015-06-04 HISTORY — DX: Reserved for inherently not codable concepts without codable children: IMO0001

## 2015-06-04 HISTORY — DX: Polyneuropathy, unspecified: G62.9

## 2015-06-04 HISTORY — DX: Cellulitis, unspecified: L03.90

## 2015-06-04 HISTORY — PX: COLONOSCOPY WITH PROPOFOL: SHX5780

## 2015-06-04 SURGERY — COLONOSCOPY WITH PROPOFOL
Anesthesia: Monitor Anesthesia Care

## 2015-06-04 MED ORDER — LIDOCAINE HCL (CARDIAC) 20 MG/ML IV SOLN
INTRAVENOUS | Status: AC
  Filled 2015-06-04: qty 5

## 2015-06-04 MED ORDER — SODIUM CHLORIDE 0.9 % IV SOLN
INTRAVENOUS | Status: DC
  Administered 2015-06-04: 1000 mL via INTRAVENOUS

## 2015-06-04 MED ORDER — PROPOFOL 10 MG/ML IV BOLUS
INTRAVENOUS | Status: AC
  Filled 2015-06-04: qty 40

## 2015-06-04 MED ORDER — PROPOFOL 10 MG/ML IV BOLUS
INTRAVENOUS | Status: DC | PRN
  Administered 2015-06-04: 20 mg via INTRAVENOUS

## 2015-06-04 MED ORDER — PROPOFOL 500 MG/50ML IV EMUL
INTRAVENOUS | Status: DC | PRN
  Administered 2015-06-04: 140 ug/kg/min via INTRAVENOUS

## 2015-06-04 MED ORDER — LIDOCAINE HCL (CARDIAC) 20 MG/ML IV SOLN
INTRAVENOUS | Status: DC | PRN
  Administered 2015-06-04: 100 mg via INTRAVENOUS

## 2015-06-04 MED ORDER — PROPOFOL 10 MG/ML IV BOLUS
INTRAVENOUS | Status: AC
  Filled 2015-06-04: qty 20

## 2015-06-04 SURGICAL SUPPLY — 22 items

## 2015-06-04 NOTE — Op Note (Signed)
[facility] Patient Name: April [name] Procedure Date: 06/04/2015 MRN: [id number] Attending MD: Jerene Bears , MD Date of Birth: [birth year] CSN:  Age: 65 Admit Type: Outpatient Procedure:                Colonoscopy Indications:              High risk colon cancer surveillance: Personal                            history of colonic polyps (hyperplastic polyp                            removed from transverse colon in 2012), Family                            history of colon cancer Providers:                Lajuan Lines. Hilarie Fredrickson, MD, Truddie Coco, RN, Cherylynn Ridges,                            Technician, Danley Danker, CRNA Referring MD:              Medicines:                Monitored Anesthesia Care Complications:            No immediate complications. Estimated Blood Loss:     Estimated blood loss: none. Procedure:                Pre-Anesthesia Assessment:                           - Prior to the procedure, a History and Physical                            was performed, and patient medications and                            allergies were reviewed. The patient's tolerance of                            previous anesthesia was also reviewed. The risks                            and benefits of the procedure and the sedation                            options and risks were discussed with the patient.                            All questions were answered, and informed consent                            was obtained. Prior Anticoagulants: The patient has  taken no previous anticoagulant or antiplatelet                            agents. ASA Grade Assessment: III - A patient with                            severe systemic disease. After reviewing the risks                            and benefits, the patient was deemed in                            satisfactory condition to undergo the procedure.                           After obtaining informed consent,  the colonoscope                            was passed under direct vision. Throughout the                            procedure, the patient's blood pressure, pulse, and                            oxygen saturations were monitored continuously. The                            was introduced through the anus and advanced to the                            the cecum, identified by appendiceal orifice and                            ileocecal valve. The colonoscopy was performed                            without difficulty. The patient tolerated the                            procedure well. The quality of the bowel                            preparation was excellent. The ileocecal valve,                            appendiceal orifice, and rectum were photographed. Scope In: 11:20:32 AM Scope Out: 11:40:52 AM Scope Withdrawal Time: 0 hours 15 minutes 57 seconds  Total Procedure Duration: 0 hours 20 minutes 20 seconds  Findings:      The colon (entire examined portion) appeared normal.      Internal hemorrhoids were found during retroflexion. The hemorrhoids       were small. Impression:               - The entire examined colon is normal.                           -  Internal hemorrhoids.                           - No specimens collected. Moderate Sedation:      N/A Recommendation:           - Patient has a contact number available for                            emergencies. The signs and symptoms of potential                            delayed complications were discussed with the                            patient. Return to normal activities tomorrow.                            Written discharge instructions were provided to the                            patient.                           - Resume previous diet.                           - Continue present medications.                           - Repeat colonoscopy in 5 years for screening                            purposes given  family history of colon cancer. Procedure Code(s):        --- Professional ---                           [id number], Colorectal cancer screening; colonoscopy on                            individual at high risk Diagnosis Code(s):        --- Professional ---                           Z86.010, Personal history of colonic polyps                           K64.8, Other hemorrhoids                           Z80.0, Family history of malignant neoplasm of                            digestive organs CPT copyright 2016 American Medical Association. All rights reserved. The codes documented in this report are preliminary and upon coder review may  be revised to meet current compliance requirements. Jerene Bears, MD 06/04/2015 11:45:11 AM This report has  been signed electronically. Number of Addenda: 0

## 2015-06-04 NOTE — Anesthesia Postprocedure Evaluation (Signed)
Anesthesia Post Note  Patient: April [name]  Procedure(s) Performed: Procedure(s) (LRB): COLONOSCOPY WITH PROPOFOL (N/A)  Patient location during evaluation: Endoscopy Anesthesia Type: MAC Level of consciousness: awake and alert Pain management: pain level controlled Vital Signs Assessment: post-procedure vital signs reviewed and stable Respiratory status: spontaneous breathing, nonlabored ventilation, respiratory function stable and patient connected to nasal cannula oxygen Cardiovascular status: stable and blood pressure returned to baseline Anesthetic complications: no    Last Vitals:  Filed Vitals:   06/04/15 1230 06/04/15 1240  BP: 171/74 202/86  Pulse: 76 78  Temp:    Resp: 17 21    Last Pain:  Filed Vitals:   06/04/15 1309  PainSc: [name]

## 2015-06-04 NOTE — Transfer of Care (Signed)
Immediate Anesthesia Transfer of Care Note  Patient: April [name]  Procedure(s) Performed: Procedure(s): COLONOSCOPY WITH PROPOFOL (N/A)  Patient Location: Endoscopy Unit  Anesthesia Type:MAC  Level of Consciousness: awake  Airway & Oxygen Therapy: Patient Spontanous Breathing and Patient connected to face mask oxygen  Post-op Assessment: Report given to RN and Post -op Vital signs reviewed and stable  Post vital signs: Reviewed and stable  Last Vitals:  Filed Vitals:   06/04/15 1008  BP: 157/74  Pulse: 76  Temp: 36.7 C  Resp: 17    Last Pain:  Filed Vitals:   06/04/15 1014  PainSc: 7       Patients Stated Pain Goal: 0 [id number]  Complications: No apparent anesthesia complications

## 2015-06-04 NOTE — Anesthesia Preprocedure Evaluation (Addendum)
Anesthesia Evaluation  Patient identified by MRN, date of birth, ID band Patient awake    Reviewed: Allergy & Precautions, NPO status , Patient's Chart, lab work & pertinent test results, reviewed documented beta blocker date and time   Airway Mallampati: IV  TM Distance: >3 FB Neck ROM: Full    Dental  (+) Teeth Intact   Pulmonary asthma , sleep apnea ,    breath sounds clear to auscultation       Cardiovascular hypertension, Pt. on medications and Pt. on home beta blockers + CAD and +CHF   Rhythm:Regular Rate:Normal     Neuro/Psych  Neuromuscular disease negative psych ROS   GI/Hepatic Neg liver ROS, PUD, GERD  Medicated,  Endo/Other  diabetes  Renal/GU negative Renal ROS  negative genitourinary   Musculoskeletal  (+) Arthritis ,   Abdominal (+) + obese,   Peds negative pediatric ROS (+)  Hematology   Anesthesia Other Findings   Reproductive/Obstetrics negative OB ROS                            Lab Results  Component Value Date   WBC 5.3 05/08/2015   HGB 11.0* 05/08/2015   HCT 34.6* 05/08/2015   MCV 89.8 05/08/2015   PLT 143.0* 05/08/2015   Lab Results  Component Value Date   INR 1.0 07/04/2006   INR 1.0 RATIO 03/26/2006     Anesthesia Physical Anesthesia Plan  ASA: III  Anesthesia Plan: MAC   Post-op Pain Management:    Induction: Intravenous  Airway Management Planned: Simple Face Mask  Additional Equipment:   Intra-op Plan:   Post-operative Plan:   Informed Consent: I have reviewed the patients History and Physical, chart, labs and discussed the procedure including the risks, benefits and alternatives for the proposed anesthesia with the patient or authorized representative who has indicated his/her understanding and acceptance.     Plan Discussed with: CRNA  Anesthesia Plan Comments:        Anesthesia Quick Evaluation

## 2015-06-04 NOTE — H&P (Signed)
HPI: April [name] is a 65 year old female with a past medical history of hyperplastic colon polyps, family history of colon cancer, GERD, obesity, chronic lymphedema, diabetes, hypertension, hyperlipidemia and CAD who presents for surveillance colonoscopy. She last had a colonoscopy performed in 2012 by Dr. Watt Climes.  Polyps removed from the transverse colon and found to be hyperplastic. Repeat colonoscopy was recommended in 5 years. She has had issues with constipation improved with stool softeners and occasional MiraLAX. She denies recent abdominal pain, change in bowel habit, blood in her stool or melena. No recent chest pain or shortness of breath.  She was hospitalized in March 2017 for worsening LE edema.  She now has home health for PT, OT and nursing to help with bathing and cleaning lower extremities.    Past Medical History  Diagnosis Date  . Meralgia paresthetica     Dr. [name]  . CTS (carpal tunnel syndrome)   . CAD (coronary artery disease)   . Osteoarthrosis, unspecified whether generalized or localized, lower leg   . Diabetes mellitus   . Hypertension   . Hyperlipidemia   . Morbid obesity ([place])   . PUD (peptic ulcer disease)   . Unspecified hereditary and idiopathic peripheral neuropathy   . Morbid obesity ([place])   . Type II or unspecified type diabetes mellitus without mention of complication, not stated as uncontrolled   . Carpal tunnel syndrome   . Asthma   . Colon polyp, hyperplastic 2007 & 2012  . Anemia   . Vitamin B12 deficiency   . Neuropathy ([facility])     toes and legs  . Wound cellulitis     right upper leg, healing well  . CHF (congestive heart failure) ([place])   . Complication of anesthesia 1999    svt with renal calculi surgery, no problems since  . Eczema   . Shortness of breath dyspnea     with exertion    Past Surgical History  Procedure Laterality Date  . Dilation and curettage of uterus      multiple  . Tonsillectomy and adenoidectomy    . Cardiac  catheterization  2002    non obstructive disease  . Renal calculi  12/1997    SVT with induction of anesthesia  . Colonoscopy with polypectomy  2007 & 2012     hyperplastic ;Dr Watt Climes  . Hemorrhoid surgery    . Abdominal hysterectomy    . Right knee arthroscopy       (Not in an outpatient encounter)  Allergies  Allergen Reactions  . Penicillins Rash    She was told not to take it anymore. Has patient had a PCN reaction causing immediate rash, facial/tongue/throat swelling, SOB or lightheadedness with hypotension: Yes Has patient had a PCN reaction causing severe rash involving mucus membranes or skin necrosis: No Has patient had a PCN reaction that required hospitalization: No Has patient had a PCN reaction occurring within the last 10 years: No If all of the above answers are "NO", then may proceed with Cephalosporin use.'  . Sulfonamide Derivatives Anaphylaxis    REACTION: internal "burning"  . Benazepril Hcl Other (See Comments)    Patient unsure of reaction.  . Hydrochlorothiazide W-Triamterene Other (See Comments)    Hypokalemia  . Metronidazole Other (See Comments)    Patient unsure of reaction.  Marland Kitchen Spironolactone     "kidney problems"  . Torsemide Other (See Comments)    Patient unsure of reaction.  . Valsartan Other (See Comments)    Patient unsure  of reaction.    Family History  Problem Relation Age of Onset  . Colon cancer Mother   . Prostate cancer Father   . Colon cancer Father   . Diabetes Maternal Aunt   . Diabetes Maternal Uncle   . Diabetes Paternal Aunt   . Stroke Paternal Aunt     > 65  . Heart disease Paternal Aunt   . Diabetes Paternal Uncle   . Breast cancer Maternal Aunt      X 2    Social History  Substance Use Topics  . Smoking status: Never Smoker   . Smokeless tobacco: Never Used  . Alcohol Use: No    ROS: As per history of present illness, otherwise negative  BP 157/74 mmHg  Pulse 76  Temp(Src) 98.1 F (36.7 C) (Oral)  Resp  17  Ht 5\' 4"  (1.626 m)  Wt 376 lb (170.552 kg)  BMI 64.51 kg/m2  SpO2 96% Gen: awake, alert, NAD HEENT: anicteric, op clear CV: RRR, no mrg Pulm: CTA b/l Abd: soft, obese, NT/ND, +BS throughout Ext: significant LE lymphedema with LE wrapped to mid-shin, hyperpigmentation b/l consistent with chronic venous stasis Neuro: nonfocal   RELEVANT LABS AND IMAGING: CBC    Component Value Date/Time   WBC 5.3 05/08/2015 1554   RBC 3.85* 05/08/2015 1554   HGB 11.0* 05/08/2015 1554   HCT 34.6* 05/08/2015 1554   PLT 143.0* 05/08/2015 1554   MCV 89.8 05/08/2015 1554   MCH 28.2 04/16/2015 0413   MCHC 31.9 05/08/2015 1554   RDW 16.1* 05/08/2015 1554   LYMPHSABS 1.3 05/08/2015 1554   MONOABS 0.5 05/08/2015 1554   EOSABS 0.1 05/08/2015 1554   BASOSABS 0.0 05/08/2015 1554    CMP     Component Value Date/Time   NA 145 05/08/2015 1554   NA 145* 10/25/2012 1117   K 3.6 05/08/2015 1554   CL 110 05/08/2015 1554   CO2 29 05/08/2015 1554   GLUCOSE 92 05/08/2015 1554   GLUCOSE 105* 10/25/2012 1117   BUN 19 05/08/2015 1554   BUN 24 10/25/2012 1117   CREATININE 0.94 05/08/2015 1554   CALCIUM 9.4 05/08/2015 1554   PROT 6.5 05/08/2015 1554   PROT 6.4 10/25/2012 1117   ALBUMIN 3.6 05/08/2015 1554   ALBUMIN 4.1 10/25/2012 1117   AST 12 05/08/2015 1554   ALT 8 05/08/2015 1554   ALKPHOS 47 05/08/2015 1554   BILITOT 0.6 05/08/2015 1554   GFRNONAA 51* 04/16/2015 0413   GFRAA 59* 04/16/2015 0413    ASSESSMENT/PLAN:  65 year old female with a past medical history of hyperplastic colon polyps, family history of colon cancer, GERD, obesity, diabetes, hypertension, hyperlipidemia and CAD who presents for surveillance colonoscopy.   1. Family hx of colon cancer/personal history of hyperplastic polyps in transverse colon -- patient presents for screening/surveillance colonoscopy in the outpatient hospital setting with monitored anesthesia care due to morbid obesity. The nature of the procedure, as  well as the risks, benefits, and alternatives were carefully and thoroughly reviewed with the patient. Ample time for discussion and questions allowed. The patient understood, was satisfied, and agreed to proceed.       Cc:No referring provider defined for this encounter.

## 2015-06-04 NOTE — Discharge Instructions (Signed)

## 2015-06-05 ENCOUNTER — Encounter (HOSPITAL_COMMUNITY): Payer: Self-pay | Admitting: Internal Medicine

## 2015-06-05 ENCOUNTER — Ambulatory Visit: Payer: Medicare Other

## 2015-06-07 ENCOUNTER — Encounter: Payer: Self-pay | Admitting: Internal Medicine

## 2015-06-07 ENCOUNTER — Ambulatory Visit (INDEPENDENT_AMBULATORY_CARE_PROVIDER_SITE_OTHER): Payer: Medicare Other | Admitting: Internal Medicine

## 2015-06-07 VITALS — BP 154/90 | HR 74 | Temp 98.4°F | Resp 18 | Wt 376.0 lb

## 2015-06-07 DIAGNOSIS — Z23 Encounter for immunization: Secondary | ICD-10-CM

## 2015-06-07 DIAGNOSIS — R600 Localized edema: Secondary | ICD-10-CM | POA: Diagnosis not present

## 2015-06-07 DIAGNOSIS — G5603 Carpal tunnel syndrome, bilateral upper limbs: Secondary | ICD-10-CM

## 2015-06-07 DIAGNOSIS — E1140 Type 2 diabetes mellitus with diabetic neuropathy, unspecified: Secondary | ICD-10-CM | POA: Diagnosis not present

## 2015-06-07 DIAGNOSIS — G5600 Carpal tunnel syndrome, unspecified upper limb: Secondary | ICD-10-CM | POA: Insufficient documentation

## 2015-06-07 NOTE — Patient Instructions (Addendum)
   prenvar vaccine administered today.   Medications reviewed and updated.  Changes include increasing the lasix to 60 mg daily for one week only.     Please followup in 2 months

## 2015-06-07 NOTE — Assessment & Plan Note (Signed)
Increased - ? Cause - possibly related to increased fluid intake due to colonoscopy Taking lasix 40 mg daily -- increase to 60 mg daily for the next week then go back to 40 mg  - if swelling is not better she will call - will need to recheck cmp before continuing lasix at a higher dose more than one week Still has visiting nurse Continue to increase activity/walking Compliant with a low sodium diet

## 2015-06-07 NOTE — Assessment & Plan Note (Signed)
Lab Results  Component Value Date   HGBA1C 5.9 04/10/2015   Foot exam done today - would benefit from diabetic shoes - high risk for foot ulcer and problems related to decreased sensation and edema Will send a letter to her podiatrist

## 2015-06-07 NOTE — Assessment & Plan Note (Addendum)
Her numbness/tingling in her hands are likely from her known CTS ( confirmed in past by nerve conduction), less likely cervical radiculopathy She has wrist splints and will start wearing them - if no improvement will evaluate further

## 2015-06-07 NOTE — Progress Notes (Signed)
Pre visit review using our clinic review tool, if applicable. No additional management support is needed unless otherwise documented below in the visit note. 

## 2015-06-07 NOTE — Progress Notes (Signed)
Subjective:    Patient ID: April [name], female    DOB: 1950/10/07, 65 y.o.   MRN: [id number]  HPI She is here for follow up.    Leg swelling:  The nurse comes twice a week - last week she noticed increased swelling.  She has increased pain and and thobbing pain in her right leg more than her leg.    She is taking the 40 mg of lasix daily.  She just had a colonoscopy and drak a lot of fluids, but denies other changes in her medications or diet.  She denies shortness of breath above her baseline.  Her right lower leg ulcer continues to heal.    Tingling in hands:  Her right hand is numb and there is tingling in the entire hand.  The right hand is worse, but she has it in both hands.  Sitting can sometimes make it worse.  She denies neck pain.  She has weakness in her hands.   She has known carpal tunnel syndrome bilaterally.  She has wrist braces at home, but has not tried using them.      Medications and allergies reviewed with patient and updated if appropriate.  Patient Active Problem List   Diagnosis Date Noted  . Family history of colon cancer   . History of colonic polyps   . Leg ulcer ([place]) 04/24/2015  . Diabetes mellitus with neurological manifestations ([place]) 04/18/2015  . Bilateral leg edema 04/11/2015  . Cellulitis of both lower extremities 04/11/2015  . Abnormality of gait 01/03/2015  . Hereditary and idiopathic peripheral neuropathy 01/03/2015  . GERD (gastroesophageal reflux disease) 06/17/2014  . Morbid obesity with BMI of 50.0-59.9, adult ([place]) 09/05/2013  . Hx of colonic polyps 12/15/2012  . Vitamin B12 deficiency 11/03/2012  . Intrinsic asthma 03/23/2012  . Chronic diastolic heart failure ([place]) 02/20/2011  . OSA (obstructive sleep apnea) 09/17/2010  . URINARY URGENCY 01/08/2010  . CAD, NATIVE VESSEL 11/20/2008  . OSTEOARTHRITIS, KNEES, BILATERAL, SEVERE 06/14/2008  . Hyperlipidemia 05/10/2007  . Essential hypertension 01/18/2007  . HYPOKALEMIA 04/30/2006  .  HX, PERSONAL, PEPTIC ULCER DISEASE 04/30/2006    Current Outpatient Prescriptions on File Prior to Visit  Medication Sig Dispense Refill  . aspirin (ECOTRIN LOW STRENGTH) 81 MG EC tablet Take 81 mg by mouth at bedtime.     . budesonide-formoterol (SYMBICORT) 160-4.5 MCG/ACT inhaler one - two inhalations every 12 hours; gargle and spit after use (Patient taking differently: Inhale 2 puffs into the lungs 2 (two) times daily as needed (shortness of breath). gargle and spit after use) 1 Inhaler 4  . carvedilol (COREG) 25 MG tablet Take 1 tablet (25 mg total) by mouth 2 (two) times daily with a meal. 60 tablet 3  . Cod Liver Oil CAPS Take 1 capsule by mouth daily.     . cyanocobalamin (,VITAMIN B-12,) 1000 MCG/ML injection Inject 1 mL (1,000 mcg total) into the muscle every 30 (thirty) days. 2 mL 0  . docusate sodium (COLACE) 100 MG capsule Take 1 capsule (100 mg total) by mouth 2 (two) times daily as needed for mild constipation. (Patient taking differently: Take 200 mg by mouth at bedtime as needed for mild constipation. ) 60 capsule 0  . Flaxseed, Linseed, (FLAX SEED OIL) 1000 MG CAPS Take 1,000 mg by mouth 2 (two) times daily.     . folic acid (FOLVITE) Q000111Q MCG tablet Take 800 mcg by mouth at bedtime.     . furosemide (LASIX) 40  MG tablet Take 1 tablet (40 mg total) by mouth daily. Take 1-1.5 Tablets by mouth daily PRN pedal edema (Patient taking differently: Take 40 mg by mouth daily. ) 90 tablet 0  . gabapentin (NEURONTIN) 300 MG capsule Take 1 capsule (300 mg total) by mouth 3 (three) times daily. (Patient taking differently: Take 300 mg by mouth 3 (three) times daily as needed (pain). ) 90 capsule 6  . glucose blood (ONE TOUCH ULTRA TEST) test strip Use to check blood sugars twice a day Dx E11.9 100 each 3  . hydrALAZINE (APRESOLINE) 50 MG tablet Take 1 tab by mouth every 8 hrs 270 tablet 1  . Lancets (ONETOUCH ULTRASOFT) lancets Use to help check blood sugars twice a day Dx E11.9 100 each 3  .  Multiple Vitamin (MULTIVITAMIN) tablet Take 1 tablet by mouth daily.      Marland Kitchen NEEDLE, DISP, 23 G (EASY TOUCH FLIPLOCK NEEDLES) 23G X 1-1/2" MISC Use to inject B12 once monthly. 2 each 0  . Omega-3 Fatty Acids (FISH OIL) 1000 MG CAPS Take 1,000 mg by mouth daily.     Marland Kitchen omeprazole (PRILOSEC) 40 MG capsule Take 1 capsule (40 mg total) by mouth 2 (two) times daily. (Patient taking differently: Take 40 mg by mouth 2 (two) times daily as needed (acid reflux). ) 60 capsule 6  . potassium chloride SA (K-DUR,KLOR-CON) 20 MEQ tablet Take 1 tablet (20 mEq total) by mouth daily. 90 tablet 1  . simvastatin (ZOCOR) 40 MG tablet Take 1 tablet (40 mg total) by mouth daily. --- Please establish with new PCP for further refills (Patient taking differently: Take 40 mg by mouth at bedtime. --- Please establish with new PCP for further refills) 90 tablet 1  . SYRINGE-NEEDLE, DISP, 3 ML 23G X 1" 3 ML MISC Supply to draw up B-12 from vial.  Separate needle sent in for injection. 2 each 0  . topiramate (TOPAMAX) 100 MG tablet Take 1 tablet (100 mg total) by mouth 2 (two) times daily. 60 tablet 11  . verapamil (VERELAN PM) 120 MG 24 hr capsule Take 1 capsule (120 mg total) by mouth daily. 90 capsule 1  . albuterol (PROVENTIL HFA) 108 (90 BASE) MCG/ACT inhaler Inhale 2 puffs into the lungs every 4 (four) hours as needed for wheezing or shortness of breath. 1 Inhaler 4   No current facility-administered medications on file prior to visit.    Past Medical History  Diagnosis Date  . Meralgia paresthetica     Dr. [name]  . CTS (carpal tunnel syndrome)   . CAD (coronary artery disease)   . Osteoarthrosis, unspecified whether generalized or localized, lower leg   . Diabetes mellitus   . Hypertension   . Hyperlipidemia   . Morbid obesity ([place])   . PUD (peptic ulcer disease)   . Unspecified hereditary and idiopathic peripheral neuropathy   . Morbid obesity ([place])   . Type II or unspecified type diabetes mellitus without mention  of complication, not stated as uncontrolled   . Carpal tunnel syndrome   . Asthma   . Colon polyp, hyperplastic 2007 & 2012  . Anemia   . Vitamin B12 deficiency   . Neuropathy ([facility])     toes and legs  . Wound cellulitis     right upper leg, healing well  . CHF (congestive heart failure) ([place])   . Complication of anesthesia 1999    svt with renal calculi surgery, no problems since  . Eczema   .  Shortness of breath dyspnea     with exertion    Past Surgical History  Procedure Laterality Date  . Dilation and curettage of uterus      multiple  . Tonsillectomy and adenoidectomy    . Cardiac catheterization  2002    non obstructive disease  . Renal calculi  12/1997    SVT with induction of anesthesia  . Colonoscopy with polypectomy  2007 & 2012     hyperplastic ;Dr Watt Climes  . Hemorrhoid surgery    . Abdominal hysterectomy    . Right knee arthroscopy    . Colonoscopy with propofol N/A 06/04/2015    Procedure: COLONOSCOPY WITH PROPOFOL;  Surgeon: Jerene Bears, MD;  Location: [facility];  Service: Gastroenterology;  Laterality: N/A;    Social History   Social History  . Marital Status: Married    Spouse Name: [name]  . Number of Children: 1  . Years of Education: BS   Occupational History  . Disabled    Social History Main Topics  . Smoking status: Never Smoker   . Smokeless tobacco: Never Used  . Alcohol Use: No  . Drug Use: No  . Sexual Activity: Not on file   Other Topics Concern  . Not on file   Social History Narrative   Patient lives at home with her husband [name] Patient is retired and has a Conservation officer, nature.    Caffeine - some times.   Right handed.    Family History  Problem Relation Age of Onset  . Colon cancer Mother   . Prostate cancer Father   . Colon cancer Father   . Diabetes Maternal Aunt   . Diabetes Maternal Uncle   . Diabetes Paternal Aunt   . Stroke Paternal Aunt     > 65  . Heart disease Paternal Aunt   . Diabetes Paternal  Uncle   . Breast cancer Maternal Aunt      X 2    Review of Systems  Constitutional: Negative for fever.  Respiratory: Positive for shortness of breath (chronic, no change) and wheezing (? weather related). Negative for cough.   Cardiovascular: Positive for leg swelling. Negative for chest pain.  Neurological: Positive for numbness. Negative for light-headedness and headaches.       Objective:   Filed Vitals:   06/07/15 1427  BP: 154/90  Pulse: 74  Temp: 98.4 F (36.9 C)  Resp: 18   Filed Weights   06/07/15 1427  Weight: 376 lb (170.552 kg)   Body mass index is 64.51 kg/(m^2).   Physical Exam Constitutional: Appears well-developed and well-nourished. No distress.  Cardiovascular: Normal rate, regular rhythm and normal heart sounds.   No murmur heard.  2+ b/l edema Pulmonary/Chest: Effort normal and breath sounds normal. No respiratory distress. No wheezes.  Ext: chronic edema in B/L lower legs and feet, hyperkeratotic nails that are deformed.  Decreased monofilament b/l, no ulcer on feet, but on right lower leg that is close and healing,  No bunions, palpable pulses.        Assessment & Plan:   See Problem List for Assessment and Plan of chronic medical problems.

## 2015-06-12 ENCOUNTER — Telehealth: Payer: Self-pay | Admitting: Neurology

## 2015-06-12 MED ORDER — "NEEDLE (DISP) 23G X 1-1/2"" MISC": Status: DC

## 2015-06-12 MED ORDER — "SYRINGE/NEEDLE (DISP) 23G X 1"" 3 ML MISC": Status: DC

## 2015-06-12 MED ORDER — CYANOCOBALAMIN 1000 MCG/ML IJ SOLN: 1000.0000 ug | INTRAMUSCULAR | Status: DC

## 2015-06-12 NOTE — Telephone Encounter (Signed)
Spoke to patient - her home health has been extended two additional months - she needs a new rx for her B12 injections - the nurse will continue giving them to her at home - ok, per Dr. [name], to send in refills to the pharmacy.

## 2015-06-12 NOTE — Telephone Encounter (Signed)
Message For: OFFICE               Taken 10-MAY-17 at 11:58AM by DRS ------------------------------------------------------------  Caller  MRS. PEEK                  CID  [id number]   Patient  SAME                  Pt's Dr  [name]           Area Code  336  Phone#  [phone]  *  DOB  2050-06-24     RE  LOOKING TO SPEAK TO NURSE ABOUT B-12 INJECTION                                                          Disp:Y/N  N  If Y = C/B If No Response In 35minutes

## 2015-06-19 ENCOUNTER — Telehealth: Payer: Self-pay | Admitting: Emergency Medicine

## 2015-06-19 NOTE — Telephone Encounter (Signed)
[facility] informed.

## 2015-06-19 NOTE — Telephone Encounter (Signed)
[facility] called to extend orders for pt 2xs week for 1 week, 2xs week after recert. Please advise.   CBGwynneth Macleod [phone]

## 2015-06-19 NOTE — Telephone Encounter (Signed)
ok 

## 2015-06-19 NOTE — Telephone Encounter (Signed)
April [name] is requesting to extend pts Homehealth nursing 2 xs a week for 9 weeks, and to have an aid 3 xs a week for bathing. Please advise.  Meriel FlavorsEstill Bamberg820 [phone]

## 2015-06-20 NOTE — Telephone Encounter (Signed)
LVM with Estill Bamberg from [place] of Verbal Orders.

## 2015-06-21 ENCOUNTER — Telehealth: Payer: Self-pay | Admitting: Internal Medicine

## 2015-06-21 NOTE — Telephone Encounter (Signed)
Contacted pt and she did not know who we needed to contact within [facility] nor the reason for sending the letter to [facility].   Phone number [phone]  Medical Claims Address: [address] [place] [address]

## 2015-06-21 NOTE — Telephone Encounter (Signed)
Patient states she needs a copy of the letter written to the podiatrist sent to [facility].  Please follow up with patient once letter has been sent.

## 2015-06-21 NOTE — Telephone Encounter (Signed)
[place]. Rep stated that she was not able to give me the claim number because she needed to know the DOS in order to pull up the claim.  Called patient back. LVM for pt to call back as soon as possible.  RE: I need the date of service that caused the claim she is calling about.   Letter has been printed per pt request. Mailing a copy to the patient for her records.  [place] Member ID [id number]

## 2015-06-28 ENCOUNTER — Telehealth: Payer: Self-pay | Admitting: Internal Medicine

## 2015-06-28 NOTE — Telephone Encounter (Signed)
Requesting verbal orders to extend OT for twice a week for 6 weeks beginning next week.

## 2015-06-28 NOTE — Telephone Encounter (Signed)
Verbal okay given via vm.

## 2015-07-04 ENCOUNTER — Ambulatory Visit: Payer: Medicare PPO | Admitting: Neurology

## 2015-07-04 ENCOUNTER — Ambulatory Visit: Payer: Medicare Other

## 2015-07-08 ENCOUNTER — Telehealth: Payer: Self-pay | Admitting: Neurology

## 2015-07-08 ENCOUNTER — Telehealth: Payer: Self-pay | Admitting: Internal Medicine

## 2015-07-08 NOTE — Telephone Encounter (Signed)
Patient needs call back in regards to needing a letter in regards to her foot care.  Patient needs letter faxed to insurance company at [phone] (needs member ID # [id number] and name on letter).  Also needs pre certification for diabetic shoes faxed to [phone].

## 2015-07-08 NOTE — Telephone Encounter (Addendum)
Pt called request to schedule B12 with the following dates to get on the schedule: 8/1 @10am , 9/1 @ 10am, 10/3 @10am .  Dr [name] is not in the clinic in August until the 14th. Pt said "this will mess up my B12 shots, there are suppose to be every 30 days". Operator relayed that RN would call her back to set these dates up. Thank you!

## 2015-07-08 NOTE — Telephone Encounter (Signed)
Returned call to patient - she has been placed on the nurse's schedule for the requested dates.

## 2015-07-10 ENCOUNTER — Encounter: Payer: Self-pay | Admitting: Emergency Medicine

## 2015-07-10 NOTE — Telephone Encounter (Signed)
Spoke with pt. She states that she already has the shoes and is waiting on a form from the store ([employer]) to send her for re-embersement. She will let our office know once she has gotten the form.

## 2015-07-10 NOTE — Telephone Encounter (Signed)
Letter is printed for MDs signature, LVM with pt to call back regarding Diabetic shoes. Need to know where she plans to get them from.

## 2015-07-10 NOTE — Telephone Encounter (Signed)
Letter has been faxed to insurance comp

## 2015-07-11 ENCOUNTER — Ambulatory Visit: Payer: Medicare Other

## 2015-07-12 ENCOUNTER — Ambulatory Visit: Payer: Medicare Other | Admitting: Internal Medicine

## 2015-07-15 ENCOUNTER — Other Ambulatory Visit: Payer: Self-pay | Admitting: *Deleted

## 2015-07-15 MED ORDER — TOPIRAMATE 100 MG PO TABS: 100.0000 mg | ORAL_TABLET | Freq: Two times a day (BID) | ORAL | Status: DC

## 2015-07-18 ENCOUNTER — Ambulatory Visit (INDEPENDENT_AMBULATORY_CARE_PROVIDER_SITE_OTHER): Payer: Medicare Other | Admitting: Neurology

## 2015-07-18 ENCOUNTER — Encounter: Payer: Self-pay | Admitting: Neurology

## 2015-07-18 VITALS — BP 132/68 | HR 68 | Ht 64.0 in | Wt 368.0 lb

## 2015-07-18 DIAGNOSIS — R202 Paresthesia of skin: Secondary | ICD-10-CM | POA: Diagnosis not present

## 2015-07-18 MED ORDER — DULOXETINE HCL 60 MG PO CPEP: 60.0000 mg | ORAL_CAPSULE | Freq: Every day | ORAL | Status: DC

## 2015-07-18 NOTE — Progress Notes (Signed)
Chief Complaint  Patient presents with  . Rm 4  . Follow-up    Pt returns for 6 mo f/u of leg cramps and neuropathy. Also c/o some numbness/tingling in small fingers of both hands. Had hospitalization in March and then rehab d/t edema in LEs w/ cellulitis. Has home health for wound care a few times a week, as well as PT and OT.      [facility]  PATIENT: April [name] DOB: 13-Sep-1950  HISTORY OF PRESENT ILLNESS:HISTORY ( Initial evaluation 10/25/2012): evaluation of bilateral muscle cramping.  She had past medical history of obesity, diabetes, hyperlipidemia, hypertension, presenting with bilateral feet paresthesia muscle cramping. saw her previously for diabetic peripheral neuropathy, she complained of bilateral knee pain, bilateral feet paresthesia, difficulty bearing weight, gait difficulty, EMG nerve conduction study in January 2013 confirmed the diagnosis of axonal peripheral neuropathy,   She has been taking Topamax 100 mg twice a day for bilateral feet paresthesia, previous laboratory evaluation demonstrated normal sodium, potassium, magnesium, CPK of 92.  She continued to suffer severe morbid obesity,  low back pain, gait difficulty, bilateral knee pain, recent in one year, she also began to suffer bilateral feet muscle cramping, intermittent, severe, lasting 7-8 minutes, happen about once a week. It often involves her feet muscles, also involves her thigh muscles.  She continued to have significant obesity, gait difficulty.  Lab showed low B12 183 October 2014, received B12 shot, now monthly, repeat B12 level January 2015 was normal 622  She came in with wheelchair, last visit was with Hoyle Sauer in January 2015, she recently developed acid reflux, now on prilosec, mild difficulty swallowing. She still has feet swelling, bilateral feet numbness, burning sensation, frequent bilateral feet, and leg muscle crampiness, couple times a week, woke her up from sleep, very  painful, she is taking Topamax 100 mg twice a day, which seems to help her some,  She has become more sedentary cause of her weight, knee pain, sitting in wheelchair most of the time.  She received home physical therapy,now using hospital bed and gel overlay, lower air loss mattress, new walker, overhead trapezius bar, because of her limited mobility,obesity, orthostatic dizziness, multiple joints pain, especially right knee pain, bilateral feet paresthesia, needle prick pain. She also developed bilateral posterior thigh area skin abrasion, pressor ulcers.  UPDATE June 15th 2017: Last visit was with Hoyle Sauer in December 2016, she is accompanied by her father at today's clinical visit, she was admitted to the hospital in March 11 through 14 2017 for bilateral lower extremity cellulitis  I reviewed hospital records, she presented with bilateral lower extremity swelling and pain, blisters on her right leg, echocardiogram showed grade 1 diastolic dysfunction, there was no evidence of right cardiac failure, ejection fraction 65-70%, no evidence of pulmonary edema, she was aggressively diuresed with Lasix, was negative balance of 4700 cc, was also treated with 5 days of antibiotics, her symptoms has much improved,  I have reviewed laboratory evaluation, A1c was normal 5.4, B12 was within normal limits, normal CBC,with exception of mild anemia hemoglobin of 10 point 9, BMP showed mild elevated glucose 105, creatinine 1.2  She is taking gabapentin 300 mg 3 times a day and also Topamax 100 mg twice a day for her neuropathic pain involving bilateral lower extremity, she has a history of kidney stone, I have advised her stop Topamax, add on Cymbalta 60 mg daily, long-term, continues can be associated with weight gain, if she is not sure about the benefit from  the medication, she should stop taking gabapentin use as well  Today she also complains of worsening bilateral hands paresthesia, involving fourth and fifth  fingers, and also first 3 fingers, worse on the right side, mild weakness, she denies neck pain  REVIEW OF SYSTEMS: Full 14 system review of systems performed and notable only for those listed, all others are neg: Chill, fatigue, cough, wheezing, shortness of breath, joint pain, muscle cramps, walking difficulty, numbness  ALLERGIES: Allergies  Allergen Reactions  . Penicillins Rash    She was told not to take it anymore. Has patient had a PCN reaction causing immediate rash, facial/tongue/throat swelling, SOB or lightheadedness with hypotension: Yes Has patient had a PCN reaction causing severe rash involving mucus membranes or skin necrosis: No Has patient had a PCN reaction that required hospitalization: No Has patient had a PCN reaction occurring within the last 10 years: No If all of the above answers are "NO", then may proceed with Cephalosporin use.'  . Sulfonamide Derivatives Anaphylaxis    REACTION: internal "burning"  . Benazepril Hcl Other (See Comments)    Patient unsure of reaction.  . Hydrochlorothiazide W-Triamterene Other (See Comments)    Hypokalemia  . Metronidazole Other (See Comments)    Patient unsure of reaction.  Marland Kitchen Spironolactone     "kidney problems"  . Torsemide Other (See Comments)    Patient unsure of reaction.  . Valsartan Other (See Comments)    Patient unsure of reaction.    HOME MEDICATIONS: Outpatient Prescriptions Prior to Visit  Medication Sig Dispense Refill  . aspirin (ECOTRIN LOW STRENGTH) 81 MG EC tablet Take 81 mg by mouth at bedtime.     . budesonide-formoterol (SYMBICORT) 160-4.5 MCG/ACT inhaler one - two inhalations every 12 hours; gargle and spit after use (Patient taking differently: Inhale 2 puffs into the lungs 2 (two) times daily as needed (shortness of breath). gargle and spit after use) 1 Inhaler 4  . carvedilol (COREG) 25 MG tablet Take 1 tablet (25 mg total) by mouth 2 (two) times daily with a meal. 60 tablet 3  . Cod Liver Oil  CAPS Take 1 capsule by mouth daily.     . cyanocobalamin (,VITAMIN B-12,) 1000 MCG/ML injection Inject 1 mL (1,000 mcg total) into the muscle every 30 (thirty) days. 2 mL 0  . docusate sodium (COLACE) 100 MG capsule Take 1 capsule (100 mg total) by mouth 2 (two) times daily as needed for mild constipation. (Patient taking differently: Take 200 mg by mouth at bedtime as needed for mild constipation. ) 60 capsule 0  . Flaxseed, Linseed, (FLAX SEED OIL) 1000 MG CAPS Take 1,000 mg by mouth 2 (two) times daily.     . folic acid (FOLVITE) Q000111Q MCG tablet Take 800 mcg by mouth at bedtime.     . furosemide (LASIX) 40 MG tablet Take 1 tablet (40 mg total) by mouth daily. Take 1-1.5 Tablets by mouth daily PRN pedal edema (Patient taking differently: Take 40 mg by mouth daily. ) 90 tablet 0  . gabapentin (NEURONTIN) 300 MG capsule Take 1 capsule (300 mg total) by mouth 3 (three) times daily. (Patient taking differently: Take 300 mg by mouth 3 (three) times daily as needed (pain). ) 90 capsule 6  . glucose blood (ONE TOUCH ULTRA TEST) test strip Use to check blood sugars twice a day Dx E11.9 100 each 3  . hydrALAZINE (APRESOLINE) 50 MG tablet Take 1 tab by mouth every 8 hrs 270 tablet 1  .  Lancets (ONETOUCH ULTRASOFT) lancets Use to help check blood sugars twice a day Dx E11.9 100 each 3  . Multiple Vitamin (MULTIVITAMIN) tablet Take 1 tablet by mouth daily.      Marland Kitchen NEEDLE, DISP, 23 G (EASY TOUCH FLIPLOCK NEEDLES) 23G X 1-1/2" MISC Use to inject B12 once monthly. 2 each 0  . Omega-3 Fatty Acids (FISH OIL) 1000 MG CAPS Take 1,000 mg by mouth daily.     Marland Kitchen omeprazole (PRILOSEC) 40 MG capsule Take 1 capsule (40 mg total) by mouth 2 (two) times daily. (Patient taking differently: Take 40 mg by mouth 2 (two) times daily as needed (acid reflux). ) 60 capsule 6  . potassium chloride SA (K-DUR,KLOR-CON) 20 MEQ tablet Take 1 tablet (20 mEq total) by mouth daily. 90 tablet 1  . simvastatin (ZOCOR) 40 MG tablet Take 1 tablet  (40 mg total) by mouth daily. --- Please establish with new PCP for further refills (Patient taking differently: Take 40 mg by mouth at bedtime. --- Please establish with new PCP for further refills) 90 tablet 1  . SYRINGE-NEEDLE, DISP, 3 ML 23G X 1" 3 ML MISC Supply to draw up B-12 from vial.  Separate needle sent in for injection. 2 each 0  . topiramate (TOPAMAX) 100 MG tablet Take 1 tablet (100 mg total) by mouth 2 (two) times daily. 60 tablet 11  . verapamil (VERELAN PM) 120 MG 24 hr capsule Take 1 capsule (120 mg total) by mouth daily. 90 capsule 1  . albuterol (PROVENTIL HFA) 108 (90 BASE) MCG/ACT inhaler Inhale 2 puffs into the lungs every 4 (four) hours as needed for wheezing or shortness of breath. 1 Inhaler 4   No facility-administered medications prior to visit.    PAST MEDICAL HISTORY: Past Medical History  Diagnosis Date  . Meralgia paresthetica     Dr. [name]  . CTS (carpal tunnel syndrome)   . CAD (coronary artery disease)   . Osteoarthrosis, unspecified whether generalized or localized, lower leg   . Diabetes mellitus   . Hypertension   . Hyperlipidemia   . Morbid obesity ([place])   . PUD (peptic ulcer disease)   . Unspecified hereditary and idiopathic peripheral neuropathy   . Morbid obesity ([place])   . Type II or unspecified type diabetes mellitus without mention of complication, not stated as uncontrolled   . Carpal tunnel syndrome   . Asthma   . Colon polyp, hyperplastic 2007 & 2012  . Anemia   . Vitamin B12 deficiency   . Neuropathy ([facility])     toes and legs  . Wound cellulitis     right upper leg, healing well  . CHF (congestive heart failure) ([place])   . Complication of anesthesia 1999    svt with renal calculi surgery, no problems since  . Eczema   . Shortness of breath dyspnea     with exertion    PAST SURGICAL HISTORY: Past Surgical History  Procedure Laterality Date  . Dilation and curettage of uterus      multiple  . Tonsillectomy and adenoidectomy    .  Cardiac catheterization  2002    non obstructive disease  . Renal calculi  12/1997    SVT with induction of anesthesia  . Colonoscopy with polypectomy  2007 & 2012     hyperplastic ;Dr Watt Climes  . Hemorrhoid surgery    . Abdominal hysterectomy    . Right knee arthroscopy    . Colonoscopy with propofol N/A 06/04/2015  Procedure: COLONOSCOPY WITH PROPOFOL;  Surgeon: Jerene Bears, MD;  Location: [facility];  Service: Gastroenterology;  Laterality: N/A;    FAMILY HISTORY: Family History  Problem Relation Age of Onset  . Colon cancer Mother   . Prostate cancer Father   . Colon cancer Father   . Diabetes Maternal Aunt   . Diabetes Maternal Uncle   . Diabetes Paternal Aunt   . Stroke Paternal Aunt     > 65  . Heart disease Paternal Aunt   . Diabetes Paternal Uncle   . Breast cancer Maternal Aunt      X 2    SOCIAL HISTORY: Social History   Social History  . Marital Status: Married    Spouse Name: [name]  . Number of Children: 1  . Years of Education: BS   Occupational History  . Disabled    Social History Main Topics  . Smoking status: Never Smoker   . Smokeless tobacco: Never Used  . Alcohol Use: No  . Drug Use: No  . Sexual Activity: Not on file   Other Topics Concern  . Not on file   Social History Narrative   Patient lives at home with her husband [name] Patient is retired and has a Conservation officer, nature.    Caffeine - some times.   Right handed.     PHYSICAL EXAM  Filed Vitals:   07/18/15 0925  BP: 132/68  Pulse: 68  Height: 5\' 4"  (1.626 m)  Weight: 368 lb (166.924 kg)   Body mass index is 63.14 kg/(m^2).  Generalized: Well developed, morbidly obesein no acute distress well-groomed Head: normocephalic and atraumatic,. Oropharynx benign  Neck: Supple, no carotid bruits  Cardiac: Regular rate rhythm, no murmur  Musculoskeletal: No deformity Skin bilateral lower extremity discoloration and thick extremely dry skin  Neurological examination    Mentation: Alert oriented to time, place, history taking. Attention span and concentration appropriate. Recent and remote memory intact.  Follows all commands speech and language fluent.   Cranial nerve II-XII: Fundoscopic exam reveals sharp disc margins.Pupils were equal round reactive to light extraocular movements were full, visual field were full on confrontational test. Facial sensation and strength were normal. hearing was intact to finger rubbing bilaterally. Uvula tongue midline. head turning and shoulder shrug were normal and symmetric.Tongue protrusion into cheek strength was normal. Motor: normal bulk and tone, full strength in the BUE, BLE, fine finger movements normal, no pronator drift.  Sensory: length dependent decreased touch, pinprick to knee level decreased vibratory sense to knees, she has decreased light touch and pinprick at finger pads, bilateral wrist Tinel signs. Coordination: finger-nose-finger, intact, unable to do heel-to-shin due to body body habitus. dysmetria Reflexes: hyporeflexic absent ankle jerks Gait and Station: not ambulated in wheelchair  DIAGNOSTIC DATA (LABS, IMAGING, TESTING) - I reviewed patient records, labs, notes, testing and imaging myself where available.  Lab Results  Component Value Date   WBC 5.3 05/08/2015   HGB 11.0* 05/08/2015   HCT 34.6* 05/08/2015   MCV 89.8 05/08/2015   PLT 143.0* 05/08/2015      Component Value Date/Time   NA 145 05/08/2015 1554   NA 145* 10/25/2012 1117   K 3.6 05/08/2015 1554   CL 110 05/08/2015 1554   CO2 29 05/08/2015 1554   GLUCOSE 92 05/08/2015 1554   GLUCOSE 105* 10/25/2012 1117   BUN 19 05/08/2015 1554   BUN 24 10/25/2012 1117   CREATININE 0.94 05/08/2015 1554   CALCIUM 9.4 05/08/2015 1554  PROT 6.5 05/08/2015 1554   PROT 6.4 10/25/2012 1117   ALBUMIN 3.6 05/08/2015 1554   ALBUMIN 4.1 10/25/2012 1117   AST 12 05/08/2015 1554   ALT 8 05/08/2015 1554   ALKPHOS 47 05/08/2015 1554   BILITOT 0.6  05/08/2015 1554   GFRNONAA 51* 04/16/2015 0413   GFRAA 59* 04/16/2015 0413   Lab Results  Component Value Date   CHOL 158 12/21/2014   HDL 37.80* 12/21/2014   LDLCALC 107* 12/21/2014   LDLDIRECT 169.4 05/02/2007   TRIG 67.0 12/21/2014   CHOLHDL 4 12/21/2014   Lab Results  Component Value Date   HGBA1C 5.9 04/10/2015   Lab Results  Component Value Date   VITAMINB12 1041* 04/10/2015   Lab Results  Component Value Date   TSH 1.85 05/08/2015      ASSESSMENT AND PLAN  65 y.o. year old female  Morbid obese Diabetes Peripheral neuropathy  Increased bilateral hands paresthesia most suggestive of local compression of mild bilateral carpal tunnel syndromes, and ulnar neuropathy across the elbow  I have suggested her padding her elbow with a soft pillow, wrist splint Rx was given  Stop Topamax because history of kidney stone, stop gabapentin if she is not sure about the benefit of the treatment  Add on Cymbalta 60 mg every day  Vitamin B12 deficiency  Most recent vitamin B12 was 622, it was decreased at 189 in 2014, she did report increased energy with B12 shots, which she has been receiving on a monthly basis throughout the office,  After discuss with patient, she performed continue IM injection to her primary care doctor Freddie Breech, M.D. Ph.D.  [facility] [place], [place] [address] Phone: [phone] Fax:      [phone]

## 2015-07-26 ENCOUNTER — Other Ambulatory Visit: Payer: Self-pay | Admitting: *Deleted

## 2015-07-26 DIAGNOSIS — E785 Hyperlipidemia, unspecified: Secondary | ICD-10-CM

## 2015-07-26 MED ORDER — SIMVASTATIN 40 MG PO TABS: 40.0000 mg | ORAL_TABLET | Freq: Every day | ORAL | Status: DC

## 2015-07-31 ENCOUNTER — Telehealth: Payer: Self-pay

## 2015-07-31 NOTE — Telephone Encounter (Signed)
[facility] Orders: wound care dressing on leg..   To clean with normal saline and apply calcium alginate dressing.  Ok from DR. Burns to go again with dressing.Marland Kitchen

## 2015-07-31 NOTE — Telephone Encounter (Signed)
ok 

## 2015-07-31 NOTE — Telephone Encounter (Signed)
Home Health Cert/Plan of Care received (06/20/2015 - 08/18/2015) and placed on MD's desk for signature

## 2015-08-01 NOTE — Telephone Encounter (Signed)
Paperwork signed, faxed, copy sent to scan 

## 2015-08-08 ENCOUNTER — Encounter: Payer: Self-pay | Admitting: Internal Medicine

## 2015-08-08 ENCOUNTER — Ambulatory Visit (INDEPENDENT_AMBULATORY_CARE_PROVIDER_SITE_OTHER): Payer: Medicare Other | Admitting: Internal Medicine

## 2015-08-08 ENCOUNTER — Other Ambulatory Visit: Payer: Self-pay | Admitting: Internal Medicine

## 2015-08-08 ENCOUNTER — Other Ambulatory Visit (INDEPENDENT_AMBULATORY_CARE_PROVIDER_SITE_OTHER): Payer: Medicare Other

## 2015-08-08 VITALS — BP 174/92 | HR 75 | Temp 98.3°F | Resp 20 | Wt 375.0 lb

## 2015-08-08 DIAGNOSIS — N289 Disorder of kidney and ureter, unspecified: Secondary | ICD-10-CM

## 2015-08-08 DIAGNOSIS — E538 Deficiency of other specified B group vitamins: Secondary | ICD-10-CM

## 2015-08-08 DIAGNOSIS — I10 Essential (primary) hypertension: Secondary | ICD-10-CM

## 2015-08-08 DIAGNOSIS — R0602 Shortness of breath: Secondary | ICD-10-CM | POA: Diagnosis not present

## 2015-08-08 DIAGNOSIS — E785 Hyperlipidemia, unspecified: Secondary | ICD-10-CM

## 2015-08-08 DIAGNOSIS — E1140 Type 2 diabetes mellitus with diabetic neuropathy, unspecified: Secondary | ICD-10-CM

## 2015-08-08 DIAGNOSIS — R05 Cough: Secondary | ICD-10-CM

## 2015-08-08 DIAGNOSIS — R600 Localized edema: Secondary | ICD-10-CM | POA: Diagnosis not present

## 2015-08-08 DIAGNOSIS — I5032 Chronic diastolic (congestive) heart failure: Secondary | ICD-10-CM

## 2015-08-08 DIAGNOSIS — R059 Cough, unspecified: Secondary | ICD-10-CM | POA: Insufficient documentation

## 2015-08-08 LAB — COMPREHENSIVE METABOLIC PANEL
ALBUMIN: 4.1 g/dL (ref 3.5–5.2)
ALK PHOS: 64 U/L (ref 39–117)
ALT: 9 U/L (ref 0–35)
AST: 13 U/L (ref 0–37)
BUN: 23 mg/dL (ref 6–23)
CALCIUM: 9.8 mg/dL (ref 8.4–10.5)
CO2: 29 mEq/L (ref 19–32)
Chloride: 106 mEq/L (ref 96–112)
Creatinine, Ser: 1.22 mg/dL — ABNORMAL HIGH (ref 0.40–1.20)
GFR: 56.86 mL/min — AB (ref >60.00)
GLUCOSE: 101 mg/dL — AB (ref 70–99)
POTASSIUM: 3.5 meq/L (ref 3.5–5.1)
SODIUM: 143 meq/L (ref 135–145)
TOTAL PROTEIN: 7.2 g/dL (ref 6.0–8.3)
Total Bilirubin: 0.4 mg/dL (ref 0.2–1.2)

## 2015-08-08 LAB — CBC WITH DIFFERENTIAL/PLATELET
Basophils Absolute: 0.1 10*3/uL (ref 0.0–0.1)
Basophils Relative: 1.4 % (ref 0.0–3.0)
EOS PCT: 1 % (ref 0.0–5.0)
Eosinophils Absolute: 0.1 10*3/uL (ref 0.0–0.7)
HEMATOCRIT: 37.4 % (ref 36.0–46.0)
HEMOGLOBIN: 12 g/dL (ref 12.0–15.0)
LYMPHS ABS: 1.1 10*3/uL (ref 0.7–4.0)
LYMPHS PCT: 17.1 % (ref 12.0–46.0)
MCHC: 32.2 g/dL (ref 30.0–36.0)
MCV: 87.5 fl (ref 78.0–100.0)
MONOS PCT: 6.6 % (ref 3.0–12.0)
Monocytes Absolute: 0.4 10*3/uL (ref 0.1–1.0)
NEUTROS PCT: 73.9 % (ref 43.0–77.0)
Neutro Abs: 4.8 10*3/uL (ref 1.4–7.7)
Platelets: 163 10*3/uL (ref 150.0–400.0)
RBC: 4.27 Mil/uL (ref 3.87–5.11)
RDW: 15.7 % — ABNORMAL HIGH (ref 11.5–15.5)
WBC: 6.5 10*3/uL (ref 4.0–10.5)

## 2015-08-08 LAB — BRAIN NATRIURETIC PEPTIDE: Pro B Natriuretic peptide (BNP): 48 pg/mL (ref 0.0–100.0)

## 2015-08-08 LAB — HEMOGLOBIN A1C: HEMOGLOBIN A1C: 5.5 % (ref 4.6–6.5)

## 2015-08-08 MED ORDER — DOCUSATE SODIUM 100 MG PO CAPS: 200.0000 mg | ORAL_CAPSULE | Freq: Every day | ORAL | Status: DC

## 2015-08-08 MED ORDER — FUROSEMIDE 40 MG PO TABS: 60.0000 mg | ORAL_TABLET | Freq: Every day | ORAL | Status: DC

## 2015-08-08 NOTE — Progress Notes (Signed)
Pre visit review using our clinic review tool, if applicable. No additional management support is needed unless otherwise documented below in the visit note. 

## 2015-08-08 NOTE — Patient Instructions (Signed)
  Test(s) ordered today. Your results will be released to [place] (or called to you) after review, usually within 72hours after test completion. If any changes need to be made, you will be notified at that same time.   Medications reviewed and updated.  Changes include increasing the lasix to 60 mg daily for one week.  Recheck kidney function in one week.    Your prescription(s) have been submitted to your pharmacy. Please take as directed and contact our office if you believe you are having problem(s) with the medication(s).   Please followup in 2 months

## 2015-08-08 NOTE — Assessment & Plan Note (Signed)
She does appear to be slightly overloaded, CMP, BNP today Will increase Lasix to 60 mg daily for one week, then recheck CMP May be able to alternate 60 mg with 40 mg of Lasix for may need to consider adding spironolactone to 40 mg of Lasix daily

## 2015-08-08 NOTE — Assessment & Plan Note (Signed)
Sugars well controlled at home Check A1c today

## 2015-08-08 NOTE — Progress Notes (Signed)
Subjective:    Patient ID: April [name], female    DOB: 10/05/1950, 65 y.o.   MRN: [id number]  HPI She is here for follow up.  She weighs herself and the weight is steadily increasing.  Her sugars are well controlled. Her blood pressure has been well controlled at home.  Cough:  She occasionally spits up phlegm.  She has had a cough for months and it is at night and during the day.  She has chronic SOB that has gotten a little worse.  She wonders if the shortness of breath is getting worse secondary to increased weight. She has had some wheezing.    Leg swelling, leg ulcer:  She is taking lasix 40 mg daily.   She has a scab on her posterior left lower leg.  She has a healed ulcer on her right lateral leg, which seems to be getting bigger.  Home health measure thes wound and it is getting bigger.  It looks like it has fluid in it.  It is not open.    Diabetes: She is taking her medication daily as prescribed. She is compliant with a diabetic diet. She is not exercising regularly-does minimal walking. She does do some walking with physical therapy and one day by herself-total 3 days a week. She monitors her sugars and they have been well controlled. She checks her feet daily and denies foot lesions.   Hypertension: She is taking her medication daily. She is compliant with a low sodium diet.  She is not exercising regularly.  She does monitor her blood pressure at home, [id number] systollically.    Hyperlipidemia: She is taking her medication daily. She is compliant with a low fat/cholesterol diet. She is not exercising regularly.     Medications and allergies reviewed with patient and updated if appropriate.  Patient Active Problem List   Diagnosis Date Noted  . Hand paresthesia 07/18/2015  . Carpal tunnel syndrome 06/07/2015  . Family history of colon cancer   . History of colonic polyps   . Leg ulcer ([place]) 04/24/2015  . Diabetes mellitus with neurological manifestations ([place])  04/18/2015  . Bilateral leg edema 04/11/2015  . Cellulitis of both lower extremities 04/11/2015  . Abnormality of gait 01/03/2015  . Hereditary and idiopathic peripheral neuropathy 01/03/2015  . GERD (gastroesophageal reflux disease) 06/17/2014  . Morbid obesity with BMI of 50.0-59.9, adult ([place]) 09/05/2013  . Hx of colonic polyps 12/15/2012  . Vitamin B12 deficiency 11/03/2012  . Intrinsic asthma 03/23/2012  . Chronic diastolic heart failure ([place]) 02/20/2011  . OSA (obstructive sleep apnea) 09/17/2010  . URINARY URGENCY 01/08/2010  . CAD, NATIVE VESSEL 11/20/2008  . OSTEOARTHRITIS, KNEES, BILATERAL, SEVERE 06/14/2008  . Hyperlipidemia 05/10/2007  . Essential hypertension 01/18/2007  . HYPOKALEMIA 04/30/2006  . HX, PERSONAL, PEPTIC ULCER DISEASE 04/30/2006    Current Outpatient Prescriptions on File Prior to Visit  Medication Sig Dispense Refill  . aspirin (ECOTRIN LOW STRENGTH) 81 MG EC tablet Take 81 mg by mouth at bedtime.     . budesonide-formoterol (SYMBICORT) 160-4.5 MCG/ACT inhaler one - two inhalations every 12 hours; gargle and spit after use (Patient taking differently: Inhale 2 puffs into the lungs 2 (two) times daily as needed (shortness of breath). gargle and spit after use) 1 Inhaler 4  . carvedilol (COREG) 25 MG tablet Take 1 tablet (25 mg total) by mouth 2 (two) times daily with a meal. 60 tablet 3  . Cod Liver Oil CAPS Take 1 capsule by  mouth daily.     . cyanocobalamin (,VITAMIN B-12,) 1000 MCG/ML injection Inject 1 mL (1,000 mcg total) into the muscle every 30 (thirty) days. 2 mL 0  . docusate sodium (COLACE) 100 MG capsule Take 1 capsule (100 mg total) by mouth 2 (two) times daily as needed for mild constipation. (Patient taking differently: Take 200 mg by mouth at bedtime as needed for mild constipation. ) 60 capsule 0  . DULoxetine (CYMBALTA) 60 MG capsule Take 1 capsule (60 mg total) by mouth daily. 30 capsule 12  . Flaxseed, Linseed, (FLAX SEED OIL) 1000 MG CAPS  Take 1,000 mg by mouth 2 (two) times daily.     . folic acid (FOLVITE) Q000111Q MCG tablet Take 800 mcg by mouth at bedtime.     . furosemide (LASIX) 40 MG tablet Take 1 tablet (40 mg total) by mouth daily. Take 1-1.5 Tablets by mouth daily PRN pedal edema (Patient taking differently: Take 40 mg by mouth daily. ) 90 tablet 0  . gabapentin (NEURONTIN) 300 MG capsule Take 1 capsule (300 mg total) by mouth 3 (three) times daily. (Patient taking differently: Take 300 mg by mouth 3 (three) times daily as needed (pain). ) 90 capsule 6  . glucose blood (ONE TOUCH ULTRA TEST) test strip Use to check blood sugars twice a day Dx E11.9 100 each 3  . hydrALAZINE (APRESOLINE) 50 MG tablet Take 1 tab by mouth every 8 hrs 270 tablet 1  . Lancets (ONETOUCH ULTRASOFT) lancets Use to help check blood sugars twice a day Dx E11.9 100 each 3  . Multiple Vitamin (MULTIVITAMIN) tablet Take 1 tablet by mouth daily.      Marland Kitchen NEEDLE, DISP, 23 G (EASY TOUCH FLIPLOCK NEEDLES) 23G X 1-1/2" MISC Use to inject B12 once monthly. 2 each 0  . Omega-3 Fatty Acids (FISH OIL) 1000 MG CAPS Take 1,000 mg by mouth daily.     Marland Kitchen omeprazole (PRILOSEC) 40 MG capsule Take 1 capsule (40 mg total) by mouth 2 (two) times daily. (Patient taking differently: Take 40 mg by mouth 2 (two) times daily as needed (acid reflux). ) 60 capsule 6  . potassium chloride SA (K-DUR,KLOR-CON) 20 MEQ tablet Take 1 tablet (20 mEq total) by mouth daily. 90 tablet 1  . simvastatin (ZOCOR) 40 MG tablet Take 1 tablet (40 mg total) by mouth at bedtime. 90 tablet 3  . SYRINGE-NEEDLE, DISP, 3 ML 23G X 1" 3 ML MISC Supply to draw up B-12 from vial.  Separate needle sent in for injection. 2 each 0  . topiramate (TOPAMAX) 100 MG tablet Take 1 tablet (100 mg total) by mouth 2 (two) times daily. 60 tablet 11  . triamcinolone cream (KENALOG) 0.1 %   0  . verapamil (VERELAN PM) 120 MG 24 hr capsule Take 1 capsule (120 mg total) by mouth daily. 90 capsule 1  . albuterol (PROVENTIL HFA)  108 (90 BASE) MCG/ACT inhaler Inhale 2 puffs into the lungs every 4 (four) hours as needed for wheezing or shortness of breath. 1 Inhaler 4   No current facility-administered medications on file prior to visit.    Past Medical History  Diagnosis Date  . Meralgia paresthetica     Dr. [name]  . CTS (carpal tunnel syndrome)   . CAD (coronary artery disease)   . Osteoarthrosis, unspecified whether generalized or localized, lower leg   . Diabetes mellitus   . Hypertension   . Hyperlipidemia   . Morbid obesity ([place])   . PUD (  peptic ulcer disease)   . Unspecified hereditary and idiopathic peripheral neuropathy   . Morbid obesity ([place])   . Type II or unspecified type diabetes mellitus without mention of complication, not stated as uncontrolled   . Carpal tunnel syndrome   . Asthma   . Colon polyp, hyperplastic 2007 & 2012  . Anemia   . Vitamin B12 deficiency   . Neuropathy ([facility])     toes and legs  . Wound cellulitis     right upper leg, healing well  . CHF (congestive heart failure) ([place])   . Complication of anesthesia 1999    svt with renal calculi surgery, no problems since  . Eczema   . Shortness of breath dyspnea     with exertion    Past Surgical History  Procedure Laterality Date  . Dilation and curettage of uterus      multiple  . Tonsillectomy and adenoidectomy    . Cardiac catheterization  2002    non obstructive disease  . Renal calculi  12/1997    SVT with induction of anesthesia  . Colonoscopy with polypectomy  2007 & 2012     hyperplastic ;Dr Watt Climes  . Hemorrhoid surgery    . Abdominal hysterectomy    . Right knee arthroscopy    . Colonoscopy with propofol N/A 06/04/2015    Procedure: COLONOSCOPY WITH PROPOFOL;  Surgeon: Jerene Bears, MD;  Location: [facility];  Service: Gastroenterology;  Laterality: N/A;    Social History   Social History  . Marital Status: Married    Spouse Name: [name]  . Number of Children: 1  . Years of Education: BS    Occupational History  . Disabled    Social History Main Topics  . Smoking status: Never Smoker   . Smokeless tobacco: Never Used  . Alcohol Use: No  . Drug Use: No  . Sexual Activity: Not Asked   Other Topics Concern  . None   Social History Narrative   Patient lives at home with her husband [name] Patient is retired and has a Conservation officer, nature.    Caffeine - some times.   Right handed.    Family History  Problem Relation Age of Onset  . Colon cancer Mother   . Prostate cancer Father   . Colon cancer Father   . Diabetes Maternal Aunt   . Diabetes Maternal Uncle   . Diabetes Paternal Aunt   . Stroke Paternal Aunt     > 65  . Heart disease Paternal Aunt   . Diabetes Paternal Uncle   . Breast cancer Maternal Aunt      X 2    Review of Systems  Constitutional: Negative for fever.  Respiratory: Positive for cough, shortness of breath and wheezing.   Cardiovascular: Positive for chest pain (sometimes) and leg swelling. Negative for palpitations.  Gastrointestinal: Positive for constipation. Negative for abdominal pain.       No gerd  Neurological: Positive for light-headedness (sometimes) and headaches ([place]).       Objective:   Filed Vitals:   08/08/15 0834  BP: 174/92  Pulse: 75  Temp: 98.3 F (36.8 C)  Resp: 20   Filed Weights   08/08/15 0834  Weight: 375 lb (170.099 kg)   Body mass index is 64.34 kg/(m^2).   Physical Exam Constitutional: Appears well-developed and well-nourished. No distress.  Neck: Neck supple. No tracheal deviation present. No thyromegaly present.  No carotid bruit. No cervical adenopathy.   Cardiovascular:  Normal rate, regular rhythm and normal heart sounds.   No murmur heard.  2+ pitting edema m's bilaterally with chronic skin changes Pulmonary/Chest: Effort normal and breath sounds normal. No respiratory distress. No wheezes.  Skin: Healed ulcer right lateral lower leg with fluctuating fluid and distal portion  of healed ulcer-no open wound or discharge, no surrounding erythema. Ulcer left posterior lower leg that appears healed, no discharge, slight induration underneath-slight tenderness with palpation, no surrounding erythema       Assessment & Plan:     See Problem List for Assessment and Plan of chronic medical problems.  Follow-up in 2 months, sooner if needed

## 2015-08-08 NOTE — Assessment & Plan Note (Addendum)
Blood pressure elevated here today, but well controlled at home Continue current medications at current doses CMP

## 2015-08-08 NOTE — Assessment & Plan Note (Signed)
Will receive B12 injections through home health aide as long as they come to her as, then we will resume B12 injections here in the office

## 2015-08-08 NOTE — Assessment & Plan Note (Signed)
Continue simvastatin at current dose

## 2015-08-08 NOTE — Assessment & Plan Note (Signed)
CMP today, increase Lasix to 40 mg daily CMP after one week We'll determine at that point if we can alternate 60 mg with 40 mg of Lasix daily, need to continue 60 mg daily or as spironolactone to 40 mg of Lasix daily

## 2015-08-08 NOTE — Assessment & Plan Note (Signed)
Lung exam is normal Cough may be related to fluid overload, viral illness GERD Check blood work Lasix increased Monitor for now

## 2015-08-12 ENCOUNTER — Telehealth: Payer: Self-pay

## 2015-08-12 NOTE — Telephone Encounter (Signed)
ok 

## 2015-08-12 NOTE — Telephone Encounter (Signed)
Please advise 

## 2015-08-12 NOTE — Telephone Encounter (Signed)
Verbal Orders: Requesting to extend PT for 2x a week for 6weeks  Marlowe Kays [phone]

## 2015-08-13 NOTE — Telephone Encounter (Signed)
Called Marlowe Kays to give verbal orders. She informed it would be for OT.

## 2015-08-15 ENCOUNTER — Other Ambulatory Visit (INDEPENDENT_AMBULATORY_CARE_PROVIDER_SITE_OTHER): Payer: Medicare Other

## 2015-08-15 ENCOUNTER — Telehealth: Payer: Self-pay

## 2015-08-15 DIAGNOSIS — R600 Localized edema: Secondary | ICD-10-CM

## 2015-08-15 DIAGNOSIS — N289 Disorder of kidney and ureter, unspecified: Secondary | ICD-10-CM

## 2015-08-15 LAB — BASIC METABOLIC PANEL
BUN: 24 mg/dL — ABNORMAL HIGH (ref 6–23)
CO2: 31 meq/L (ref 19–32)
Calcium: 9.4 mg/dL (ref 8.4–10.5)
Chloride: 104 mEq/L (ref 96–112)
Creatinine, Ser: 1.21 mg/dL — ABNORMAL HIGH (ref 0.40–1.20)
GFR: 57.4 mL/min — ABNORMAL LOW (ref >60.00)
GLUCOSE: 114 mg/dL — AB (ref 70–99)
Potassium: 3.6 mEq/L (ref 3.5–5.1)
Sodium: 141 mEq/L (ref 135–145)

## 2015-08-15 MED ORDER — FUROSEMIDE 40 MG PO TABS: 40.0000 mg | ORAL_TABLET | Freq: Every day | ORAL | Status: DC

## 2015-08-15 MED ORDER — SPIRONOLACTONE 25 MG PO TABS: 25.0000 mg | ORAL_TABLET | Freq: Every day | ORAL | Status: DC

## 2015-08-15 NOTE — Telephone Encounter (Signed)
Patient called back i informed her of the labs. She states her swelling is no better. She wanted to know should she keep taking the 60mg  or what should she do. Please follow up, Thank you.

## 2015-08-15 NOTE — Telephone Encounter (Signed)
Please advise 

## 2015-08-15 NOTE — Telephone Encounter (Signed)
Go back to 40 mg lasix.  Add spironolactone 25 mg daily - take both in morning.  Hold potassium pill for now.  Repeat blood work on Monday (ordered)

## 2015-08-16 ENCOUNTER — Telehealth: Payer: Self-pay | Admitting: Internal Medicine

## 2015-08-16 NOTE — Telephone Encounter (Signed)
Please advise 

## 2015-08-16 NOTE — Telephone Encounter (Signed)
ok 

## 2015-08-16 NOTE — Telephone Encounter (Signed)
Requesting verbal orders for PT extension twice a week for 8 weeks.

## 2015-08-16 NOTE — Telephone Encounter (Signed)
Spoke with pt to inform.  

## 2015-08-16 NOTE — Telephone Encounter (Signed)
Spoke with [place] to give verbal orders.

## 2015-08-19 ENCOUNTER — Other Ambulatory Visit (INDEPENDENT_AMBULATORY_CARE_PROVIDER_SITE_OTHER): Payer: Medicare Other

## 2015-08-19 DIAGNOSIS — R600 Localized edema: Secondary | ICD-10-CM

## 2015-08-19 LAB — COMPREHENSIVE METABOLIC PANEL
ALT: 9 U/L (ref 0–35)
AST: 13 U/L (ref 0–37)
Albumin: 3.8 g/dL (ref 3.5–5.2)
Alkaline Phosphatase: 55 U/L (ref 39–117)
BILIRUBIN TOTAL: 0.5 mg/dL (ref 0.2–1.2)
BUN: 24 mg/dL — ABNORMAL HIGH (ref 6–23)
CALCIUM: 9.2 mg/dL (ref 8.4–10.5)
CHLORIDE: 104 meq/L (ref 96–112)
CO2: 30 meq/L (ref 19–32)
CREATININE: 1.12 mg/dL (ref 0.40–1.20)
GFR: 62.75 mL/min (ref >60.00)
GLUCOSE: 91 mg/dL (ref 70–99)
Potassium: 3.2 mEq/L — ABNORMAL LOW (ref 3.5–5.1)
SODIUM: 140 meq/L (ref 135–145)
Total Protein: 6.7 g/dL (ref 6.0–8.3)

## 2015-08-22 ENCOUNTER — Telehealth: Payer: Self-pay | Admitting: Emergency Medicine

## 2015-08-22 NOTE — Telephone Encounter (Signed)
Pt called and has question on which prescriptions to take Please follow up thanks.

## 2015-08-23 ENCOUNTER — Telehealth: Payer: Self-pay | Admitting: Emergency Medicine

## 2015-08-23 NOTE — Telephone Encounter (Signed)
Please advise on request

## 2015-08-23 NOTE — Telephone Encounter (Signed)
Pt called and needs a refill on cyanocobalamin (VITAMIN B-12,) 1000 MCG/ML injection  And also SYRINGE-NEEDLE, DISP, 3 ML 23G X 1" 3 ML MISC. She stated Dr [name] said Dr Quay Burow can now refill those for her and her homecare nurse can help give her shot for her. She also needs glucose blood (ONE TOUCH ULTRA TEST) test strip. Pharmacy is [facility]- [place] Please follow up thanks.

## 2015-08-23 NOTE — Telephone Encounter (Signed)
Ok to refill 

## 2015-08-26 MED ORDER — CYANOCOBALAMIN 1000 MCG/ML IJ SOLN: 1000.0000 ug | INTRAMUSCULAR | 1 refills | Status: DC

## 2015-08-26 MED ORDER — GLUCOSE BLOOD VI STRP: ORAL_STRIP | 3 refills | Status: DC

## 2015-08-26 MED ORDER — "SYRINGE/NEEDLE (DISP) 23G X 1"" 3 ML MISC": 1 refills | Status: DC

## 2015-08-26 NOTE — Telephone Encounter (Signed)
LVM informing pt refills were sent in. Pt is to call back about meds.

## 2015-08-26 NOTE — Telephone Encounter (Signed)
Patient called again about her refills. And she states she had questions about how to take some of her medications. Can you please follow up, Thank you.

## 2015-08-26 NOTE — Telephone Encounter (Signed)
Refill request sent to [facility]

## 2015-08-28 ENCOUNTER — Telehealth: Payer: Self-pay | Admitting: Emergency Medicine

## 2015-08-28 NOTE — Telephone Encounter (Signed)
Pt is calling about the dosage of her prescription and which ones she is suppose to be taking. Please give her a call back. Thanks.

## 2015-08-28 NOTE — Telephone Encounter (Signed)
Spoke with pt to clarify. Per MD pt will continue Lasix and Aldactone, COming on Aug 3 to have kidney function checked.. Will follow-up after blood work for appt changes.

## 2015-08-30 ENCOUNTER — Telehealth: Payer: Self-pay | Admitting: Emergency Medicine

## 2015-08-30 NOTE — Telephone Encounter (Signed)
DONE

## 2015-08-30 NOTE — Telephone Encounter (Signed)
ok 

## 2015-08-30 NOTE — Telephone Encounter (Signed)
[facility] nurse called to ask for verbal orders to have pts left leg wrapped for wound care, verbal orders given.

## 2015-09-03 ENCOUNTER — Ambulatory Visit: Payer: Medicare Other

## 2015-09-03 ENCOUNTER — Ambulatory Visit: Payer: Self-pay

## 2015-09-04 ENCOUNTER — Telehealth: Payer: Self-pay | Admitting: Internal Medicine

## 2015-09-04 NOTE — Telephone Encounter (Signed)
She needs to be seen.

## 2015-09-04 NOTE — Telephone Encounter (Signed)
April [name] (in home nurse) called in and said that pt is developing Cellulitis in both of her legs.  Red but not warm to the touch yet.  Her right leg is feeling weak.       Jimmy Picket [phone] call office  Cell number [phone]

## 2015-09-06 ENCOUNTER — Ambulatory Visit (INDEPENDENT_AMBULATORY_CARE_PROVIDER_SITE_OTHER): Payer: Medicare Other | Admitting: Family

## 2015-09-06 ENCOUNTER — Other Ambulatory Visit (INDEPENDENT_AMBULATORY_CARE_PROVIDER_SITE_OTHER): Payer: Medicare Other

## 2015-09-06 ENCOUNTER — Encounter: Payer: Self-pay | Admitting: Family

## 2015-09-06 VITALS — BP 144/78 | HR 68 | Temp 98.1°F | Resp 18 | Ht 64.0 in | Wt 365.0 lb

## 2015-09-06 DIAGNOSIS — R600 Localized edema: Secondary | ICD-10-CM

## 2015-09-06 LAB — BASIC METABOLIC PANEL
BUN: 25 mg/dL — ABNORMAL HIGH (ref 6–23)
CO2: 29 meq/L (ref 19–32)
Calcium: 9.5 mg/dL (ref 8.4–10.5)
Chloride: 105 mEq/L (ref 96–112)
Creatinine, Ser: 1.24 mg/dL — ABNORMAL HIGH (ref 0.40–1.20)
GFR: 55.79 mL/min — AB (ref >60.00)
GLUCOSE: 103 mg/dL — AB (ref 70–99)
POTASSIUM: 3.6 meq/L (ref 3.5–5.1)
SODIUM: 141 meq/L (ref 135–145)

## 2015-09-06 NOTE — Progress Notes (Signed)
Subjective:    Patient ID: April [name], female    DOB: [birth year], 65 y.o.   MRN: [id number]  Chief Complaint  Patient presents with  . Leg Swelling    since tuesday the swelling has gotten worse    HPI:  April [name] is a 65 y.o. female who  has a past medical history of Anemia; Asthma; CAD (coronary artery disease); Carpal tunnel syndrome; CHF (congestive heart failure) ([place]); Colon polyp, hyperplastic (2007 & 2012); Complication of anesthesia (1999); CTS (carpal tunnel syndrome); Diabetes mellitus; Eczema; Hyperlipidemia; Hypertension; Meralgia paresthetica; Morbid obesity ([place]); Morbid obesity ([place]); Neuropathy ([place]); Osteoarthrosis, unspecified whether generalized or localized, lower leg; PUD (peptic ulcer disease); Shortness of breath dyspnea; Type II or unspecified type diabetes mellitus without mention of complication, not stated as uncontrolled; Unspecified hereditary and idiopathic peripheral neuropathy; Vitamin B12 deficiency; and Wound cellulitis. and presents today for a follow up office visit.   Associated symptom of edema located in her right leg that has been going on for about 3 days. No fevers. Describes occasional shooting pains. Modifying factors include furosemide to help reduce the swelling which has helped some. Has been keeping her leg elevated. Continues to walk on a regular basis. She also had a wound on the lateral side of her leg.  Allergies  Allergen Reactions  . Penicillins Rash    She was told not to take it anymore. Has patient had a PCN reaction causing immediate rash, facial/tongue/throat swelling, SOB or lightheadedness with hypotension: Yes Has patient had a PCN reaction causing severe rash involving mucus membranes or skin necrosis: No Has patient had a PCN reaction that required hospitalization: No Has patient had a PCN reaction occurring within the last 10 years: No If all of the above answers are "NO", then may proceed with Cephalosporin use.'  .  Sulfonamide Derivatives Anaphylaxis    REACTION: internal "burning"  . Benazepril Hcl Other (See Comments)    Patient unsure of reaction.  . Hydrochlorothiazide W-Triamterene Other (See Comments)    Hypokalemia  . Metronidazole Other (See Comments)    Patient unsure of reaction.  Marland Kitchen Spironolactone     "kidney problems"  . Torsemide Other (See Comments)    Patient unsure of reaction.  . Valsartan Other (See Comments)    Patient unsure of reaction.     Current Outpatient Prescriptions on File Prior to Visit  Medication Sig Dispense Refill  . aspirin (ECOTRIN LOW STRENGTH) 81 MG EC tablet Take 81 mg by mouth at bedtime.     . budesonide-formoterol (SYMBICORT) 160-4.5 MCG/ACT inhaler one - two inhalations every 12 hours; gargle and spit after use (Patient taking differently: Inhale 2 puffs into the lungs 2 (two) times daily as needed (shortness of breath). gargle and spit after use) 1 Inhaler 4  . carvedilol (COREG) 25 MG tablet Take 1 tablet (25 mg total) by mouth 2 (two) times daily with a meal. 60 tablet 3  . Cod Liver Oil CAPS Take 1 capsule by mouth daily.     . cyanocobalamin (,VITAMIN B-12,) 1000 MCG/ML injection Inject 1 mL (1,000 mcg total) into the muscle every 30 (thirty) days. 2 mL 1  . docusate sodium (COLACE) 100 MG capsule Take 2-3 capsules (200-300 mg total) by mouth daily. 90 capsule 11  . DULoxetine (CYMBALTA) 60 MG capsule Take 1 capsule (60 mg total) by mouth daily. 30 capsule 12  . Flaxseed, Linseed, (FLAX SEED OIL) 1000 MG CAPS Take 1,000 mg by mouth 2 (two)  times daily.     . folic acid (FOLVITE) Q000111Q MCG tablet Take 800 mcg by mouth at bedtime.     . furosemide (LASIX) 40 MG tablet Take 1 tablet (40 mg total) by mouth daily. 90 tablet 5  . gabapentin (NEURONTIN) 300 MG capsule Take 1 capsule (300 mg total) by mouth 3 (three) times daily. (Patient taking differently: Take 300 mg by mouth 3 (three) times daily as needed (pain). ) 90 capsule 6  . glucose blood (ONE TOUCH  ULTRA TEST) test strip Use to check blood sugars twice a day Dx E11.9 100 each 3  . hydrALAZINE (APRESOLINE) 50 MG tablet Take 1 tab by mouth every 8 hrs 270 tablet 1  . Lancets (ONETOUCH ULTRASOFT) lancets Use to help check blood sugars twice a day Dx E11.9 100 each 3  . Multiple Vitamin (MULTIVITAMIN) tablet Take 1 tablet by mouth daily.      Marland Kitchen NEEDLE, DISP, 23 G (EASY TOUCH FLIPLOCK NEEDLES) 23G X 1-1/2" MISC Use to inject B12 once monthly. 2 each 0  . Omega-3 Fatty Acids (FISH OIL) 1000 MG CAPS Take 1,000 mg by mouth daily.     Marland Kitchen omeprazole (PRILOSEC) 40 MG capsule Take 1 capsule (40 mg total) by mouth 2 (two) times daily. (Patient taking differently: Take 40 mg by mouth 2 (two) times daily as needed (acid reflux). ) 60 capsule 6  . potassium chloride SA (K-DUR,KLOR-CON) 20 MEQ tablet Take 1 tablet (20 mEq total) by mouth daily. 90 tablet 1  . simvastatin (ZOCOR) 40 MG tablet Take 1 tablet (40 mg total) by mouth at bedtime. 90 tablet 3  . spironolactone (ALDACTONE) 25 MG tablet Take 1 tablet (25 mg total) by mouth daily. 30 tablet 0  . SYRINGE-NEEDLE, DISP, 3 ML 23G X 1" 3 ML MISC Supply to draw up B-12 from vial.  Separate needle sent in for injection. 2 each 1  . topiramate (TOPAMAX) 100 MG tablet Take 1 tablet (100 mg total) by mouth 2 (two) times daily. 60 tablet 11  . triamcinolone cream (KENALOG) 0.1 %   0  . verapamil (VERELAN PM) 120 MG 24 hr capsule Take 1 capsule (120 mg total) by mouth daily. 90 capsule 1  . albuterol (PROVENTIL HFA) 108 (90 BASE) MCG/ACT inhaler Inhale 2 puffs into the lungs every 4 (four) hours as needed for wheezing or shortness of breath. 1 Inhaler 4   No current facility-administered medications on file prior to visit.                                 Review of Systems  Constitutional: Negative for chills and fever.  Respiratory: Negative for chest tightness, shortness of breath and wheezing.   Cardiovascular: Positive for leg swelling. Negative for chest  pain and palpitations.      Objective:    BP (!) 144/78 (BP Location: Left Arm, Patient Position: Sitting, Cuff Size: Large)   Pulse 68   Temp 98.1 F (36.7 C) (Oral)   Resp 18   Ht 5\' 4"  (1.626 m)   Wt (!) 365 lb (165.6 kg)   SpO2 94%   BMI 62.65 kg/m  Nursing note and vital signs reviewed.  Physical Exam  Constitutional: She is oriented to person, place, and time. She appears well-developed and well-nourished. No distress.  Cardiovascular: Normal rate, regular rhythm, normal heart sounds and intact distal pulses.   Severe lower extremity edema with  peau d orange appearing skin. No redness or significant warmth or discharge. Negative Homan's sign.  Pulmonary/Chest: Effort normal and breath sounds normal.  Neurological: She is alert and oriented to person, place, and time.  Skin: Skin is warm and dry.  Psychiatric: She has a normal mood and affect. Her behavior is normal. Judgment and thought content normal.       Assessment & Plan:   Problem List Items Addressed This Visit      Other   Bilateral leg edema - Primary    Bilateral lower extremity edema consistent with lymphedema which continues to be refractory to furosemide and time and spironolactone. Does continue to work with home health for compression wraps. Obtain basic metabolic profile. No evidence of infection noted today. Little concern for DVT. Medication adjustment pending electrolyte and kidney function results.      Relevant Orders   Basic Metabolic Panel (BMET) (Completed)    Other Visit Diagnoses   None.      I am having Ms. Davtyan maintain her aspirin, Fish Oil, Flax Seed Oil, multivitamin, Cod Liver Oil, folic acid, albuterol, omeprazole, gabapentin, budesonide-formoterol, verapamil, carvedilol, onetouch ultrasoft, potassium chloride SA, hydrALAZINE, NEEDLE (DISP) 23 G, topiramate, triamcinolone cream, DULoxetine, simvastatin, docusate sodium, furosemide, spironolactone, SYRINGE-NEEDLE (DISP) 3 ML,  glucose blood, and cyanocobalamin.    Follow-up: Return if symptoms worsen or fail to improve.  Mauricio Po, FNP

## 2015-09-06 NOTE — Patient Instructions (Addendum)
Thank you for choosing [facility].  Summary/Instructions:  Please continue to take your medications as prescribed.  Keep your legs elevated.  Decrease sodium in diet.  Compression wraps as you have been doing them.   Please stop by the lab on the lower level of the building for your blood work. Your results will be released to [facility] (or called to you) after review, usually within 72 hours after test completion. If any changes need to be made, you will be notified at that same time.  1. The lab is open from 7:30am to 5:30 pm Monday-Friday  2. No appointment is necessary  3. Fasting (if needed) is 6-8 hours after food and drink; black  coffee and water are okay   If your symptoms worsen or fail to improve, please contact our office for further instruction, or in case of emergency go directly to the emergency room at the closest medical facility.   Lymphedema Lymphedema is swelling that is caused by the abnormal collection of lymph under the skin. Lymph is fluid from the tissues in your body that travels in the lymphatic system. This system is part of the immune system and includes lymph nodes and lymph vessels. The lymph vessels collect and carry the excess fluid, fats, proteins, and wastes from the tissues of the body to the bloodstream. This system also works to clean and remove bacteria and waste products from the body. Lymphedema occurs when the lymphatic system is blocked. When the lymph vessels or lymph nodes are blocked or damaged, lymph does not drain properly, causing an abnormal buildup of lymph. This leads to swelling in the arms or legs. Lymphedema cannot be cured by medicines, but various methods can be used to help reduce the swelling. CAUSES There are two types of lymphedema. Primary lymphedema is caused by the absence or abnormality of the lymph vessel at birth. Secondary lymphedema is more common. It occurs when the lymph vessel is damaged or blocked. Common causes of  lymph vessel blockage include:  Skin infection, such as cellulitis.  Infection by parasites (filariasis).  Injury.  Cancer.  Radiation therapy.  Formation of scar tissue.  Surgery. SYMPTOMS Symptoms of this condition include:  Swelling of the arm or leg.  A heavy or tight feeling in the arm or leg.  Swelling of the feet, toes, or fingers. Shoes or rings may fit more tightly than before.  Redness of the skin over the affected area.  Limited movement of the affected limb.  Sensitivity to touch or discomfort in the affected limb. DIAGNOSIS This condition may be diagnosed with:  A physical exam.  Medical history.  Imaging tests, such as:  Lymphoscintigraphy. In this test, a low dose of a radioactive substance is injected to trace the flow of lymph through the lymph vessels.  MRI.  CT scan.  Duplex ultrasound. This test uses sound waves to produce images of the vessels and the blood flow on a screen.  Lymphangiography. In this test, a contrast dye is injected into the lymph vessel to help show blockages. TREATMENT Treatment for this condition may depend on the cause. Treatment may include:  Exercise. Certain exercises can help fluid move out of the affected limb.  Massage. Gentle massage of the affected limb can help move the fluid out of the area.  Compression. Various methods may be used to apply pressure to the affected limb in order to reduce the swelling.  Wearing compression stockings or sleeves on the affected limb.  Bandaging the  affected limb.  Using an external pump that is attached to a sleeve that alternates between applying pressure and releasing pressure.  Surgery. This is usually only done for severe cases. For example, surgery may be done if you have trouble moving the limb or if the swelling does not get better with other treatments. If an underlying condition is causing the lymphedema, treatment for that condition is needed. For example,  antibiotic medicines may be used to treat an infection. HOME CARE INSTRUCTIONS Activities  Exercise regularly as directed by your health care provider.  Do not sit with your legs crossed.  When possible, keep the affected limb raised (elevated) above the level of your heart.  Avoid carrying things with an arm that is affected by lymphedema.  Remember that the affected area is more likely to become injured or infected.  Take these steps to help prevent infection:  Keep the affected area clean and dry.  Protect your skin from cuts. For example, you should use gloves while cooking or gardening. Do not walk barefoot. If you shave the affected area, use an Copy. General Instructions  Take medicines only as directed by your health care provider.  Eat a healthy diet that includes a lot of fruits and vegetables.  Do not wear tight clothes, shoes, or jewelry.  Do not use heating pads over the affected area.  Avoid having blood pressure checked on the affected limb.  Keep all follow-up visits as directed by your health care provider. This is important. SEEK MEDICAL CARE IF:  You continue to have swelling in your limb.  You have a fever.  You have a cut that does not heal.  You have redness or pain in the affected area.  You have new swelling in your limb that comes on suddenly.  You develop purplish spots or sores (lesions) on your limb. SEEK IMMEDIATE MEDICAL CARE IF:  You have a skin rash.  You have chills or sweats.  You have shortness of breath.   This information is not intended to replace advice given to you by your health care provider. Make sure you discuss any questions you have with your health care provider.   Document Released: 11/16/2006 Document Revised: 06/05/2014 Document Reviewed: 12/27/2013 Elsevier Interactive Patient Education [employer].

## 2015-09-06 NOTE — Assessment & Plan Note (Addendum)
Bilateral lower extremity edema consistent with lymphedema which continues to be refractory to furosemide and time and spironolactone. Does continue to work with home health for compression wraps. Obtain basic metabolic profile. No evidence of infection noted today. Little concern for DVT. Medication adjustment pending electrolyte and kidney function results.

## 2015-09-10 ENCOUNTER — Other Ambulatory Visit: Payer: Self-pay | Admitting: Internal Medicine

## 2015-09-11 ENCOUNTER — Telehealth: Payer: Self-pay

## 2015-09-11 NOTE — Telephone Encounter (Signed)
Home Health Cert/Plan of Care received (08/19/2015 - 10/17/2015) and placed on MD's desk for signature

## 2015-09-14 ENCOUNTER — Other Ambulatory Visit: Payer: Self-pay | Admitting: Internal Medicine

## 2015-09-16 ENCOUNTER — Telehealth: Payer: Self-pay | Admitting: Internal Medicine

## 2015-09-16 DIAGNOSIS — R600 Localized edema: Secondary | ICD-10-CM

## 2015-09-16 NOTE — Telephone Encounter (Signed)
Please advise 

## 2015-09-16 NOTE — Telephone Encounter (Signed)
Yes, blood work ordered

## 2015-09-16 NOTE — Telephone Encounter (Signed)
Patient states she seen Marya Amsler and he increased her dosage of furosemide and spironolactone to a total of 105 mg of both together for 3 days.  Wants to know if she needs to come to the lab to have kidney function tested.  Patient states she will be in the area Thursday and can go to the lab.

## 2015-09-17 DIAGNOSIS — E1140 Type 2 diabetes mellitus with diabetic neuropathy, unspecified: Secondary | ICD-10-CM | POA: Diagnosis not present

## 2015-09-17 DIAGNOSIS — I5032 Chronic diastolic (congestive) heart failure: Secondary | ICD-10-CM | POA: Diagnosis not present

## 2015-09-17 DIAGNOSIS — M170 Bilateral primary osteoarthritis of knee: Secondary | ICD-10-CM | POA: Diagnosis not present

## 2015-09-17 DIAGNOSIS — I110 Hypertensive heart disease with heart failure: Secondary | ICD-10-CM | POA: Diagnosis not present

## 2015-09-17 NOTE — Telephone Encounter (Signed)
Paperwork signed, faxed, copy sent to scan 

## 2015-09-17 NOTE — Telephone Encounter (Signed)
Spoke with pt, she will be in the area Thursday and will get blood work done then.

## 2015-09-19 ENCOUNTER — Other Ambulatory Visit (INDEPENDENT_AMBULATORY_CARE_PROVIDER_SITE_OTHER): Payer: Medicare Other

## 2015-09-19 DIAGNOSIS — R600 Localized edema: Secondary | ICD-10-CM | POA: Diagnosis not present

## 2015-09-19 LAB — BASIC METABOLIC PANEL
BUN: 16 mg/dL (ref 6–23)
CHLORIDE: 107 meq/L (ref 96–112)
CO2: 27 meq/L (ref 19–32)
CREATININE: 1.02 mg/dL (ref 0.40–1.20)
Calcium: 9.4 mg/dL (ref 8.4–10.5)
GFR: 69.88 mL/min (ref >60.00)
Glucose, Bld: 103 mg/dL — ABNORMAL HIGH (ref 70–99)
Potassium: 3.9 mEq/L (ref 3.5–5.1)
Sodium: 140 mEq/L (ref 135–145)

## 2015-09-23 ENCOUNTER — Telehealth: Payer: Self-pay

## 2015-09-23 NOTE — Telephone Encounter (Signed)
Patient called and said that she needs her recent lab work faxed over to [place]. They are her in home health services she uses. FAXPB:[phone]

## 2015-09-23 NOTE — Telephone Encounter (Signed)
Faxed last lab from 09/19/15 to fax # below...April [name]

## 2015-10-03 ENCOUNTER — Ambulatory Visit: Payer: Self-pay

## 2015-10-04 ENCOUNTER — Ambulatory Visit: Payer: Self-pay

## 2015-10-09 ENCOUNTER — Telehealth: Payer: Self-pay | Admitting: Emergency Medicine

## 2015-10-09 NOTE — Telephone Encounter (Signed)
[facility] called back again about this. You were in a patients room so I told her you would give her a call back. Thanks.

## 2015-10-09 NOTE — Telephone Encounter (Signed)
Spoke with Rikeya from [facility] to give verbals to stop wound care on back of left thigh, was given verbal ok by Dr Quay Burow.

## 2015-10-09 NOTE — Telephone Encounter (Signed)
[facility] called and asked to speak to you. I informed her you were in a room with a patient and would have to call her back. She stated she was also seeing patients and couldn't see this actual patient until you gave her a call back. She needs verbal to discontinue wound care for the left thigh. I has been healed for 2 weeks now. Please follow up thanks.

## 2015-10-10 ENCOUNTER — Ambulatory Visit (INDEPENDENT_AMBULATORY_CARE_PROVIDER_SITE_OTHER): Payer: Medicare Other | Admitting: Internal Medicine

## 2015-10-10 ENCOUNTER — Encounter: Payer: Self-pay | Admitting: Internal Medicine

## 2015-10-10 ENCOUNTER — Other Ambulatory Visit (INDEPENDENT_AMBULATORY_CARE_PROVIDER_SITE_OTHER): Payer: Medicare Other

## 2015-10-10 VITALS — BP 144/80 | HR 73 | Temp 97.8°F | Resp 16 | Ht 64.0 in | Wt 367.0 lb

## 2015-10-10 DIAGNOSIS — Z23 Encounter for immunization: Secondary | ICD-10-CM

## 2015-10-10 DIAGNOSIS — I10 Essential (primary) hypertension: Secondary | ICD-10-CM

## 2015-10-10 DIAGNOSIS — E1140 Type 2 diabetes mellitus with diabetic neuropathy, unspecified: Secondary | ICD-10-CM

## 2015-10-10 DIAGNOSIS — Z6841 Body Mass Index (BMI) 40.0 and over, adult: Secondary | ICD-10-CM

## 2015-10-10 DIAGNOSIS — R600 Localized edema: Secondary | ICD-10-CM

## 2015-10-10 LAB — COMPREHENSIVE METABOLIC PANEL
ALK PHOS: 61 U/L (ref 39–117)
ALT: 11 U/L (ref 0–35)
AST: 13 U/L (ref 0–37)
Albumin: 3.9 g/dL (ref 3.5–5.2)
BUN: 31 mg/dL — ABNORMAL HIGH (ref 6–23)
CO2: 30 meq/L (ref 19–32)
Calcium: 9.2 mg/dL (ref 8.4–10.5)
Chloride: 106 mEq/L (ref 96–112)
Creatinine, Ser: 1.32 mg/dL — ABNORMAL HIGH (ref 0.40–1.20)
GFR: 51.89 mL/min — AB (ref >60.00)
GLUCOSE: 106 mg/dL — AB (ref 70–99)
POTASSIUM: 3.7 meq/L (ref 3.5–5.1)
Sodium: 141 mEq/L (ref 135–145)
TOTAL PROTEIN: 7.1 g/dL (ref 6.0–8.3)
Total Bilirubin: 0.4 mg/dL (ref 0.2–1.2)

## 2015-10-10 LAB — HEMOGLOBIN A1C: Hgb A1c MFr Bld: 5.6 % (ref 4.6–6.5)

## 2015-10-10 NOTE — Assessment & Plan Note (Signed)
Stable on current dose of diuretics Check cmp Stressed regular exercise, weight loss Continue current medications

## 2015-10-10 NOTE — Assessment & Plan Note (Signed)
Check a1c.   Diet controlled.

## 2015-10-10 NOTE — Progress Notes (Signed)
Pre visit review using our clinic review tool, if applicable. No additional management support is needed unless otherwise documented below in the visit note. 

## 2015-10-10 NOTE — Patient Instructions (Addendum)
  Test(s) ordered today. Your results will be released to [facility] (or called to you) after review, usually within 72hours after test completion. If any changes need to be made, you will be notified at that same time.  All other Health Maintenance issues reviewed.   All recommended immunizations and age-appropriate screenings are up-to-date or discussed.  Flu vaccine administered today.   Medications reviewed and updated.  No changes recommended at this time.  Your prescription(s) have been submitted to your pharmacy. Please take as directed and contact our office if you believe you are having problem(s) with the medication(s).   Please followup in 3 months

## 2015-10-10 NOTE — Assessment & Plan Note (Signed)
BP slightly elevated here today - home health has been monitoring and it has been on average at goal Continue current medications cmp

## 2015-10-10 NOTE — Progress Notes (Signed)
Subjective:    Patient ID: April [name], female    DOB: 05-13-1950, 65 y.o.   MRN: [id number]  HPI The patient is here for follow up.  Leg edema:  She feels the fluid pill works sometimes, but not every time she takes it.  She does feel like the fluids pills are working and her fluid is fairly controlled.  She still has leg swelling and her lower legs are tender, but they are stable.   Leg ulcers:  She has had home care for treatment of the ulcers. She had an ulcer on both legs, but currently only has a healed ulcer on her right lower lateral leg. She denies open ulcers.    Hypertension: She is taking her medication daily. She is fairly compliant with a low sodium diet.  She is doing PT, but is not exercising regularly.  She has had home care checking her blood pressure at home - it has been normal, sometimes a little up.    Diabetes: Her sugars are controlled with diet. She is compliant with a diabetic diet. She is not exercising regularly.   Hyperlipidemia: She is taking her medication daily. She is compliant with a low fat/cholesterol diet. She is not exercising regularly. She denies myalgias.   She fell.  She fell 8/27  - she slipped on her floor.  She hurt her right shoulder and arm because she tried to brace herself.  She has bruises on her right leg, right knee, left knee and left forearm.  She still has pain.  Her right hand hurts.  She is doing PT.  She is using a walker.  Sometimes she uses a cane and when she fell she was using her cane.   Medications and allergies reviewed with patient and updated if appropriate.  Patient Active Problem List   Diagnosis Date Noted  . Cough 08/08/2015  . Hand paresthesia 07/18/2015  . Carpal tunnel syndrome 06/07/2015  . Family history of colon cancer   . Leg ulcer ([place]) 04/24/2015  . Diabetes mellitus with neurological manifestations ([place]) 04/18/2015  . Bilateral leg edema 04/11/2015  . Cellulitis of both lower extremities 04/11/2015    . Abnormality of gait 01/03/2015  . Hereditary and idiopathic peripheral neuropathy 01/03/2015  . GERD (gastroesophageal reflux disease) 06/17/2014  . Morbid obesity with BMI of 50.0-59.9, adult ([place]) 09/05/2013  . Hx of colonic polyps 12/15/2012  . Vitamin B12 deficiency 11/03/2012  . Intrinsic asthma 03/23/2012  . Chronic diastolic heart failure ([place]) 02/20/2011  . OSA (obstructive sleep apnea) 09/17/2010  . URINARY URGENCY 01/08/2010  . CAD, NATIVE VESSEL 11/20/2008  . OSTEOARTHRITIS, KNEES, BILATERAL, SEVERE 06/14/2008  . Hyperlipidemia 05/10/2007  . Essential hypertension 01/18/2007  . HYPOKALEMIA 04/30/2006  . HX, PERSONAL, PEPTIC ULCER DISEASE 04/30/2006    Current Outpatient Prescriptions on File Prior to Visit  Medication Sig Dispense Refill  . aspirin (ECOTRIN LOW STRENGTH) 81 MG EC tablet Take 81 mg by mouth at bedtime.     . budesonide-formoterol (SYMBICORT) 160-4.5 MCG/ACT inhaler one - two inhalations every 12 hours; gargle and spit after use (Patient taking differently: Inhale 2 puffs into the lungs 2 (two) times daily as needed (shortness of breath). gargle and spit after use) 1 Inhaler 4  . carvedilol (COREG) 25 MG tablet Take 1 tablet (25 mg total) by mouth 2 (two) times daily with a meal. 60 tablet 3  . Cod Liver Oil CAPS Take 1 capsule by mouth daily.     Marland Kitchen  cyanocobalamin (,VITAMIN B-12,) 1000 MCG/ML injection Inject 1 mL (1,000 mcg total) into the muscle every 30 (thirty) days. 2 mL 1  . docusate sodium (COLACE) 100 MG capsule Take 2-3 capsules (200-300 mg total) by mouth daily. 90 capsule 11  . DULoxetine (CYMBALTA) 60 MG capsule Take 1 capsule (60 mg total) by mouth daily. 30 capsule 12  . Flaxseed, Linseed, (FLAX SEED OIL) 1000 MG CAPS Take 1,000 mg by mouth 2 (two) times daily.     . folic acid (FOLVITE) 854 MCG tablet Take 800 mcg by mouth at bedtime.     . furosemide (LASIX) 40 MG tablet Take 1 tablet (40 mg total) by mouth daily. 90 tablet 5  . gabapentin  (NEURONTIN) 300 MG capsule Take 1 capsule (300 mg total) by mouth 3 (three) times daily. (Patient taking differently: Take 300 mg by mouth 3 (three) times daily as needed (pain). ) 90 capsule 6  . glucose blood (ONE TOUCH ULTRA TEST) test strip Use to check blood sugars twice a day Dx E11.9 100 each 3  . hydrALAZINE (APRESOLINE) 50 MG tablet Take 1 tab by mouth every 8 hrs 270 tablet 1  . Lancets (ONETOUCH ULTRASOFT) lancets Use to help check blood sugars twice a day Dx E11.9 100 each 3  . Multiple Vitamin (MULTIVITAMIN) tablet Take 1 tablet by mouth daily.      Marland Kitchen NEEDLE, DISP, 23 G (EASY TOUCH FLIPLOCK NEEDLES) 23G X 1-1/2" MISC Use to inject B12 once monthly. 2 each 0  . Omega-3 Fatty Acids (FISH OIL) 1000 MG CAPS Take 1,000 mg by mouth daily.     Marland Kitchen omeprazole (PRILOSEC) 40 MG capsule Take 1 capsule (40 mg total) by mouth 2 (two) times daily. (Patient taking differently: Take 40 mg by mouth 2 (two) times daily as needed (acid reflux). ) 60 capsule 6  . potassium chloride SA (K-DUR,KLOR-CON) 20 MEQ tablet Take 1 tablet (20 mEq total) by mouth daily. 90 tablet 1  . RA COL-RITE 100 MG capsule take 1 capsule by mouth twice a day if needed for MILD CONSTIPATION 60 capsule 0  . simvastatin (ZOCOR) 40 MG tablet Take 1 tablet (40 mg total) by mouth at bedtime. 90 tablet 3  . spironolactone (ALDACTONE) 25 MG tablet Take 1 tablet (25 mg total) by mouth daily. 30 tablet 5  . SYRINGE-NEEDLE, DISP, 3 ML 23G X 1" 3 ML MISC Supply to draw up B-12 from vial.  Separate needle sent in for injection. 2 each 1  . topiramate (TOPAMAX) 100 MG tablet Take 1 tablet (100 mg total) by mouth 2 (two) times daily. 60 tablet 11  . triamcinolone cream (KENALOG) 0.1 %   0  . verapamil (VERELAN PM) 120 MG 24 hr capsule Take 1 capsule (120 mg total) by mouth daily. 90 capsule 1  . albuterol (PROVENTIL HFA) 108 (90 BASE) MCG/ACT inhaler Inhale 2 puffs into the lungs every 4 (four) hours as needed for wheezing or shortness of  breath. 1 Inhaler 4   No current facility-administered medications on file prior to visit.     Past Medical History:  Diagnosis Date  . Anemia   . Asthma   . CAD (coronary artery disease)   . Carpal tunnel syndrome   . CHF (congestive heart failure) ([place])   . Colon polyp, hyperplastic 2007 & 2012  . Complication of anesthesia 1999   svt with renal calculi surgery, no problems since  . CTS (carpal tunnel syndrome)   . Diabetes mellitus   .  Eczema   . Hyperlipidemia   . Hypertension   . Meralgia paresthetica    Dr. [name]  . Morbid obesity ([place])   . Morbid obesity ([place])   . Neuropathy ([facility])    toes and legs  . Osteoarthrosis, unspecified whether generalized or localized, lower leg   . PUD (peptic ulcer disease)   . Shortness of breath dyspnea    with exertion  . Type II or unspecified type diabetes mellitus without mention of complication, not stated as uncontrolled   . Unspecified hereditary and idiopathic peripheral neuropathy   . Vitamin B12 deficiency   . Wound cellulitis    right upper leg, healing well    Past Surgical History:  Procedure Laterality Date  . ABDOMINAL HYSTERECTOMY    . CARDIAC CATHETERIZATION  2002   non obstructive disease  . colonoscopy with polypectomy  2007 & 2012    hyperplastic ;Dr Watt Climes  . COLONOSCOPY WITH PROPOFOL N/A 06/04/2015   Procedure: COLONOSCOPY WITH PROPOFOL;  Surgeon: Jerene Bears, MD;  Location: [facility];  Service: Gastroenterology;  Laterality: N/A;  . DILATION AND CURETTAGE OF UTERUS     multiple  . HEMORRHOID SURGERY    . renal calculi  12/1997   SVT with induction of anesthesia  . right knee arthroscopy    . TONSILLECTOMY AND ADENOIDECTOMY      Social History   Social History  . Marital status: Married    Spouse name: [name]  . Number of children: 1  . Years of education: BS   Occupational History  . Disabled Retired   Social History Main Topics  . Smoking status: Never Smoker  . Smokeless tobacco: Never  Used  . Alcohol use No  . Drug use: No  . Sexual activity: Not on file   Other Topics Concern  . Not on file   Social History Narrative   Patient lives at home with her husband [name] Patient is retired and has a Conservation officer, nature.    Caffeine - some times.   Right handed.    Family History  Problem Relation Age of Onset  . Colon cancer Mother   . Prostate cancer Father   . Colon cancer Father   . Diabetes Maternal Aunt   . Diabetes Maternal Uncle   . Diabetes Paternal Aunt   . Stroke Paternal Aunt     > 65  . Heart disease Paternal Aunt   . Diabetes Paternal Uncle   . Breast cancer Maternal Aunt      X 2    Review of Systems  Constitutional: Negative for fever.  Respiratory: Positive for cough, shortness of breath (with exertion) and wheezing.   Cardiovascular: Positive for leg swelling. Negative for chest pain and palpitations.  Neurological: Positive for dizziness (occasionally), light-headedness (occasionally) and headaches.       Objective:   Vitals:   10/10/15 0930  BP: (!) 144/80  Pulse: 73  Resp: 16  Temp: 97.8 F (36.6 C)   Filed Weights   10/10/15 0930  Weight: (!) 367 lb (166.5 kg)   Body mass index is 63 kg/m.   Physical Exam    Constitutional: Appears well-developed and well-nourished. No distress.  HENT:  Head: Normocephalic and atraumatic.  Neck: Neck supple. No tracheal deviation present. No thyromegaly present.  Cardiovascular: Normal rate, regular rhythm and normal heart sounds.   No murmur heard.  No carotid bruit .  1 + LE edema b/l Pulmonary/Chest: Effort normal and breath sounds  normal. No respiratory distress. No has no wheezes. No rales.  Lymphadenopathy: No cervical adenopathy.  Skin: Skin is warm and dry. Not diaphoretic. mild erythema and warmth in b/l LE chronic in nature. Psychiatric: Normal mood and affect. Behavior is normal.     Assessment & Plan:    See Problem List for Assessment and Plan of chronic  medical problems.

## 2015-10-10 NOTE — Assessment & Plan Note (Signed)
Stressed weight loss  Increase exercise Decrease portions

## 2015-10-11 ENCOUNTER — Telehealth: Payer: Self-pay | Admitting: Internal Medicine

## 2015-10-11 NOTE — Telephone Encounter (Signed)
Patient is requesting call back in regards to lab.  States she also has something else to talk about too.

## 2015-10-11 NOTE — Telephone Encounter (Signed)
Spoke with pt, mailing OV notes for insurance purposes.   Pt stated that insurance will be contacting us about an appeal for Diabetic shoes.

## 2015-10-16 ENCOUNTER — Telehealth: Payer: Self-pay | Admitting: Internal Medicine

## 2015-10-16 NOTE — Telephone Encounter (Signed)
Spoke with Wes to change orders.

## 2015-10-16 NOTE — Telephone Encounter (Signed)
Please advise 

## 2015-10-16 NOTE — Telephone Encounter (Signed)
ok 

## 2015-10-16 NOTE — Telephone Encounter (Signed)
Called back and is requesting verbal orders for Home [facility] for 1 more time this wk and 3 times a wk for 8 wks. Please advise thanks.

## 2015-10-16 NOTE — Telephone Encounter (Signed)
Requesting verbal orders for one more visit for this week for re certification and then twice a week for 9 weeks.

## 2015-10-17 ENCOUNTER — Other Ambulatory Visit: Payer: Self-pay | Admitting: Internal Medicine

## 2015-10-17 ENCOUNTER — Telehealth: Payer: Self-pay | Admitting: Internal Medicine

## 2015-10-17 DIAGNOSIS — I10 Essential (primary) hypertension: Secondary | ICD-10-CM

## 2015-10-17 NOTE — Telephone Encounter (Signed)
Spoke with Rep from [facility], they will be sending over orders for Dr Quay Burow to sign.

## 2015-10-17 NOTE — Telephone Encounter (Signed)
Would like a verbal consent to release wound care supplies or have order signed and faxed back.

## 2015-11-05 ENCOUNTER — Ambulatory Visit: Payer: Self-pay

## 2015-11-05 ENCOUNTER — Other Ambulatory Visit: Payer: Self-pay | Admitting: Internal Medicine

## 2015-11-05 ENCOUNTER — Other Ambulatory Visit (INDEPENDENT_AMBULATORY_CARE_PROVIDER_SITE_OTHER): Payer: Medicare Other

## 2015-11-05 ENCOUNTER — Ambulatory Visit (INDEPENDENT_AMBULATORY_CARE_PROVIDER_SITE_OTHER): Payer: Medicare Other | Admitting: General Practice

## 2015-11-05 DIAGNOSIS — E538 Deficiency of other specified B group vitamins: Secondary | ICD-10-CM | POA: Diagnosis not present

## 2015-11-05 DIAGNOSIS — R600 Localized edema: Secondary | ICD-10-CM

## 2015-11-05 LAB — BASIC METABOLIC PANEL
BUN: 22 mg/dL (ref 6–23)
CALCIUM: 8.7 mg/dL (ref 8.4–10.5)
CO2: 29 mEq/L (ref 19–32)
Chloride: 108 mEq/L (ref 96–112)
Creatinine, Ser: 1.14 mg/dL (ref 0.40–1.20)
GFR: 61.44 mL/min (ref >60.00)
Glucose, Bld: 81 mg/dL (ref 70–99)
POTASSIUM: 3.7 meq/L (ref 3.5–5.1)
SODIUM: 144 meq/L (ref 135–145)

## 2015-11-05 MED ORDER — CYANOCOBALAMIN 1000 MCG/ML IJ SOLN
1000.0000 ug | Freq: Once | INTRAMUSCULAR | Status: AC
  Administered 2015-11-05: 1000 ug via INTRAMUSCULAR

## 2015-11-09 NOTE — Progress Notes (Signed)
Injection given.   Olajuwon Fosdick J Saqib Cazarez, MD  

## 2015-11-13 ENCOUNTER — Other Ambulatory Visit: Payer: Self-pay | Admitting: Internal Medicine

## 2015-12-02 ENCOUNTER — Telehealth: Payer: Self-pay | Admitting: Internal Medicine

## 2015-12-02 NOTE — Telephone Encounter (Signed)
Only if not improving

## 2015-12-02 NOTE — Telephone Encounter (Signed)
Spoke with pt to inform. Pt has an appt on 11/8 for a CPE, comes in 11/2 for B12. Does she need to come in before that?

## 2015-12-02 NOTE — Telephone Encounter (Signed)
Please Advise

## 2015-12-02 NOTE — Telephone Encounter (Signed)
Confirm she is taking lasix 40 mg in the morning and the spironolactone 25 mg daily.  If she is have her take an extra 1/2 of lasix (20 mg) in the afternoon.   Should follow up with me.

## 2015-12-02 NOTE — Telephone Encounter (Signed)
Patient has called in stating that both of her legs have swollen and the right leg has a dark area.  Patient would like to know if she needs to increase her lasix or if Dr. Quay Burow would like her to come into the office to be seen .

## 2015-12-03 NOTE — Telephone Encounter (Signed)
LVM informing pt

## 2015-12-05 ENCOUNTER — Ambulatory Visit (INDEPENDENT_AMBULATORY_CARE_PROVIDER_SITE_OTHER): Payer: Medicare Other

## 2015-12-05 DIAGNOSIS — E538 Deficiency of other specified B group vitamins: Secondary | ICD-10-CM

## 2015-12-05 MED ORDER — CYANOCOBALAMIN 1000 MCG/ML IJ SOLN
1000.0000 ug | Freq: Once | INTRAMUSCULAR | Status: AC
  Administered 2015-12-05: 1000 ug via INTRAMUSCULAR

## 2015-12-05 NOTE — Progress Notes (Signed)
Injection given.   Dahir Ayer J Melanny Wire, MD  

## 2015-12-09 ENCOUNTER — Other Ambulatory Visit: Payer: Self-pay | Admitting: Internal Medicine

## 2015-12-09 DIAGNOSIS — I10 Essential (primary) hypertension: Secondary | ICD-10-CM

## 2015-12-11 ENCOUNTER — Other Ambulatory Visit (INDEPENDENT_AMBULATORY_CARE_PROVIDER_SITE_OTHER): Payer: Medicare Other

## 2015-12-11 ENCOUNTER — Encounter: Payer: Self-pay | Admitting: Internal Medicine

## 2015-12-11 ENCOUNTER — Ambulatory Visit (INDEPENDENT_AMBULATORY_CARE_PROVIDER_SITE_OTHER): Payer: Medicare Other | Admitting: Internal Medicine

## 2015-12-11 VITALS — BP 162/90 | HR 79 | Temp 98.6°F | Resp 18 | Wt 368.0 lb

## 2015-12-11 DIAGNOSIS — R600 Localized edema: Secondary | ICD-10-CM | POA: Diagnosis not present

## 2015-12-11 DIAGNOSIS — G609 Hereditary and idiopathic neuropathy, unspecified: Secondary | ICD-10-CM | POA: Diagnosis not present

## 2015-12-11 DIAGNOSIS — K219 Gastro-esophageal reflux disease without esophagitis: Secondary | ICD-10-CM

## 2015-12-11 DIAGNOSIS — Z6841 Body Mass Index (BMI) 40.0 and over, adult: Secondary | ICD-10-CM

## 2015-12-11 DIAGNOSIS — Z0000 Encounter for general adult medical examination without abnormal findings: Secondary | ICD-10-CM

## 2015-12-11 DIAGNOSIS — E1140 Type 2 diabetes mellitus with diabetic neuropathy, unspecified: Secondary | ICD-10-CM

## 2015-12-11 DIAGNOSIS — Z13820 Encounter for screening for osteoporosis: Secondary | ICD-10-CM

## 2015-12-11 DIAGNOSIS — Z780 Asymptomatic menopausal state: Secondary | ICD-10-CM

## 2015-12-11 DIAGNOSIS — I10 Essential (primary) hypertension: Secondary | ICD-10-CM

## 2015-12-11 LAB — CBC WITH DIFFERENTIAL/PLATELET
BASOS ABS: 0.1 10*3/uL (ref 0.0–0.1)
Basophils Relative: 1.3 % (ref 0.0–3.0)
Eosinophils Absolute: 0.1 10*3/uL (ref 0.0–0.7)
Eosinophils Relative: 1.4 % (ref 0.0–5.0)
HCT: 38.9 % (ref 36.0–46.0)
Hemoglobin: 12.5 g/dL (ref 12.0–15.0)
LYMPHS ABS: 1.7 10*3/uL (ref 0.7–4.0)
Lymphocytes Relative: 26.8 % (ref 12.0–46.0)
MCHC: 32.2 g/dL (ref 30.0–36.0)
MCV: 89.3 fl (ref 78.0–100.0)
MONO ABS: 0.5 10*3/uL (ref 0.1–1.0)
MONOS PCT: 8.6 % (ref 3.0–12.0)
NEUTROS PCT: 61.9 % (ref 43.0–77.0)
Neutro Abs: 3.9 10*3/uL (ref 1.4–7.7)
Platelets: 187 10*3/uL (ref 150.0–400.0)
RBC: 4.35 Mil/uL (ref 3.87–5.11)
RDW: 15.4 % (ref 11.5–15.5)
WBC: 6.4 10*3/uL (ref 4.0–10.5)

## 2015-12-11 LAB — TSH: TSH: 2.74 u[IU]/mL (ref 0.35–4.50)

## 2015-12-11 LAB — COMPREHENSIVE METABOLIC PANEL
ALBUMIN: 4.1 g/dL (ref 3.5–5.2)
ALK PHOS: 54 U/L (ref 39–117)
ALT: 10 U/L (ref 0–35)
AST: 12 U/L (ref 0–37)
BUN: 29 mg/dL — AB (ref 6–23)
CO2: 29 mEq/L (ref 19–32)
Calcium: 9.5 mg/dL (ref 8.4–10.5)
Chloride: 107 mEq/L (ref 96–112)
Creatinine, Ser: 1.22 mg/dL — ABNORMAL HIGH (ref 0.40–1.20)
GFR: 56.8 mL/min — ABNORMAL LOW (ref >60.00)
Glucose, Bld: 91 mg/dL (ref 70–99)
POTASSIUM: 3.7 meq/L (ref 3.5–5.1)
SODIUM: 143 meq/L (ref 135–145)
TOTAL PROTEIN: 7.1 g/dL (ref 6.0–8.3)
Total Bilirubin: 0.5 mg/dL (ref 0.2–1.2)

## 2015-12-11 LAB — HEMOGLOBIN A1C: HEMOGLOBIN A1C: 5.5 % (ref 4.6–6.5)

## 2015-12-11 MED ORDER — FUROSEMIDE 40 MG PO TABS: ORAL_TABLET | ORAL | 5 refills | Status: DC

## 2015-12-11 NOTE — Progress Notes (Signed)
Pre visit review using our clinic review tool, if applicable. No additional management support is needed unless otherwise documented below in the visit note. 

## 2015-12-11 NOTE — Patient Instructions (Signed)
April [name] , Thank you for taking time to come for your Medicare Wellness Visit. I appreciate your ongoing commitment to your health goals. Please review the following plan we discussed and let me know if I can assist you in the future.   These are the goals we discussed: Goals    Continue to work on weight loss      This is a list of the screening recommended for you and due dates:  Health Maintenance  Topic Date Due  . DEXA scan (bone density measurement)  06/06/2015  . Hemoglobin A1C  04/08/2016  . Urine Protein Check  04/09/2016  . Eye exam for diabetics  05/01/2016  . Complete foot exam   12/10/2016  . Pneumonia vaccines (2 of 2 - PPSV23) 01/03/2017  . Mammogram  03/14/2017  . Tetanus Vaccine  10/08/2024  . Colon Cancer Screening  06/03/2025  . Flu Shot  Completed  . Shingles Vaccine  Completed  .  Hepatitis C: One time screening is recommended by Center for Disease Control  (CDC) for  adults born from [birth year] through 1965.   Completed  . HIV Screening  Completed     Test(s) ordered today. Your results will be released to [facility] (or called to you) after review, usually within 72hours after test completion. If any changes need to be made, you will be notified at that same time.  All other Health Maintenance issues reviewed.   All recommended immunizations and age-appropriate screenings are up-to-date or discussed.  No immunizations administered today.   Medications reviewed and updated.  No changes recommended at this time.   A referral for podiatry was ordered  Please followup in 6 months  Health Maintenance, Female Adopting a healthy lifestyle and getting preventive care can go a long way to promote health and wellness. Talk with your health care provider about what schedule of regular examinations is right for you. This is a good chance for you to check in with your provider about disease prevention and staying healthy. In between checkups, there are plenty of things  you can do on your own. Experts have done a lot of research about which lifestyle changes and preventive measures are most likely to keep you healthy. Ask your health care provider for more information. WEIGHT AND DIET  Eat a healthy diet  Be sure to include plenty of vegetables, fruits, low-fat dairy products, and lean protein.  Do not eat a lot of foods high in solid fats, added sugars, or salt.  Get regular exercise. This is one of the most important things you can do for your health.  Most adults should exercise for at least 150 minutes each week. The exercise should increase your heart rate and make you sweat (moderate-intensity exercise).  Most adults should also do strengthening exercises at least twice a week. This is in addition to the moderate-intensity exercise.  Maintain a healthy weight  Body mass index (BMI) is a measurement that can be used to identify possible weight problems. It estimates body fat based on height and weight. Your health care provider can help determine your BMI and help you achieve or maintain a healthy weight.  For females 32 years of age and older:   A BMI below 18.5 is considered underweight.  A BMI of 18.5 to 24.9 is normal.  A BMI of 25 to 29.9 is considered overweight.  A BMI of 30 and above is considered obese.  Watch levels of cholesterol and blood lipids  You  should start having your blood tested for lipids and cholesterol at 65 years of age, then have this test every 5 years.  You may need to have your cholesterol levels checked more often if:  Your lipid or cholesterol levels are high.  You are older than 66 years of age.  You are at high risk for heart disease.  CANCER SCREENING   Lung Cancer  Lung cancer screening is recommended for adults 40-56 years old who are at high risk for lung cancer because of a history of smoking.  A yearly low-dose CT scan of the lungs is recommended for people who:  Currently smoke.  Have  quit within the past 15 years.  Have at least a 30-pack-year history of smoking. A pack year is smoking an average of one pack of cigarettes a day for 1 year.  Yearly screening should continue until it has been 15 years since you quit.  Yearly screening should stop if you develop a health problem that would prevent you from having lung cancer treatment.  Breast Cancer  Practice breast self-awareness. This means understanding how your breasts normally appear and feel.  It also means doing regular breast self-exams. Let your health care provider know about any changes, no matter how small.  If you are in your 20s or 30s, you should have a clinical breast exam (CBE) by a health care provider every 1-3 years as part of a regular health exam.  If you are 8 or older, have a CBE every year. Also consider having a breast X-ray (mammogram) every year.  If you have a family history of breast cancer, talk to your health care provider about genetic screening.  If you are at high risk for breast cancer, talk to your health care provider about having an MRI and a mammogram every year.  Breast cancer gene (BRCA) assessment is recommended for women who have family members with BRCA-related cancers. BRCA-related cancers include:  Breast.  Ovarian.  Tubal.  Peritoneal cancers.  Results of the assessment will determine the need for genetic counseling and BRCA1 and BRCA2 testing. Cervical Cancer Your health care provider may recommend that you be screened regularly for cancer of the pelvic organs (ovaries, uterus, and vagina). This screening involves a pelvic examination, including checking for microscopic changes to the surface of your cervix (Pap test). You may be encouraged to have this screening done every 3 years, beginning at age 80.  For women ages 51-65, health care providers may recommend pelvic exams and Pap testing every 3 years, or they may recommend the Pap and pelvic exam, combined  with testing for human papilloma virus (HPV), every 5 years. Some types of HPV increase your risk of cervical cancer. Testing for HPV may also be done on women of any age with unclear Pap test results.  Other health care providers may not recommend any screening for nonpregnant women who are considered low risk for pelvic cancer and who do not have symptoms. Ask your health care provider if a screening pelvic exam is right for you.  If you have had past treatment for cervical cancer or a condition that could lead to cancer, you need Pap tests and screening for cancer for at least 20 years after your treatment. If Pap tests have been discontinued, your risk factors (such as having a new sexual partner) need to be reassessed to determine if screening should resume. Some women have medical problems that increase the chance of getting cervical cancer. In these  cases, your health care provider may recommend more frequent screening and Pap tests. Colorectal Cancer  This type of cancer can be detected and often prevented.  Routine colorectal cancer screening usually begins at 65 years of age and continues through 65 years of age.  Your health care provider may recommend screening at an earlier age if you have risk factors for colon cancer.  Your health care provider may also recommend using home test kits to check for hidden blood in the stool.  A small camera at the end of a tube can be used to examine your colon directly (sigmoidoscopy or colonoscopy). This is done to check for the earliest forms of colorectal cancer.  Routine screening usually begins at age 103.  Direct examination of the colon should be repeated every 5-10 years through 65 years of age. However, you may need to be screened more often if early forms of precancerous polyps or small growths are found. Skin Cancer  Check your skin from head to toe regularly.  Tell your health care provider about any new moles or changes in moles,  especially if there is a change in a mole's shape or color.  Also tell your health care provider if you have a mole that is larger than the size of a pencil eraser.  Always use sunscreen. Apply sunscreen liberally and repeatedly throughout the day.  Protect yourself by wearing long sleeves, pants, a wide-brimmed hat, and sunglasses whenever you are outside. HEART DISEASE, DIABETES, AND HIGH BLOOD PRESSURE   High blood pressure causes heart disease and increases the risk of stroke. High blood pressure is more likely to develop in:  People who have blood pressure in the high end of the normal range (130-139/85-89 mm Hg).  People who are overweight or obese.  People who are African American.  If you are 82-66 years of age, have your blood pressure checked every 3-5 years. If you are 44 years of age or older, have your blood pressure checked every year. You should have your blood pressure measured twice--once when you are at a hospital or clinic, and once when you are not at a hospital or clinic. Record the average of the two measurements. To check your blood pressure when you are not at a hospital or clinic, you can use:  An automated blood pressure machine at a pharmacy.  A home blood pressure monitor.  If you are between 20 years and 44 years old, ask your health care provider if you should take aspirin to prevent strokes.  Have regular diabetes screenings. This involves taking a blood sample to check your fasting blood sugar level.  If you are at a normal weight and have a low risk for diabetes, have this test once every three years after 66 years of age.  If you are overweight and have a high risk for diabetes, consider being tested at a younger age or more often. PREVENTING INFECTION  Hepatitis B  If you have a higher risk for hepatitis B, you should be screened for this virus. You are considered at high risk for hepatitis B if:  You were born in a country where hepatitis B is  common. Ask your health care provider which countries are considered high risk.  Your parents were born in a high-risk country, and you have not been immunized against hepatitis B (hepatitis B vaccine).  You have HIV or AIDS.  You use needles to inject street drugs.  You live with someone who has hepatitis  B.  You have had sex with someone who has hepatitis B.  You get hemodialysis treatment.  You take certain medicines for conditions, including cancer, organ transplantation, and autoimmune conditions. Hepatitis C  Blood testing is recommended for:  Everyone born from [birth year] through 1965.  Anyone with known risk factors for hepatitis C. Sexually transmitted infections (STIs)  You should be screened for sexually transmitted infections (STIs) including gonorrhea and chlamydia if:  You are sexually active and are younger than 65 years of age.  You are older than 65 years of age and your health care provider tells you that you are at risk for this type of infection.  Your sexual activity has changed since you were last screened and you are at an increased risk for chlamydia or gonorrhea. Ask your health care provider if you are at risk.  If you do not have HIV, but are at risk, it may be recommended that you take a prescription medicine daily to prevent HIV infection. This is called pre-exposure prophylaxis (PrEP). You are considered at risk if:  You are sexually active and do not regularly use condoms or know the HIV status of your partner(s).  You take drugs by injection.  You are sexually active with a partner who has HIV. Talk with your health care provider about whether you are at high risk of being infected with HIV. If you choose to begin PrEP, you should first be tested for HIV. You should then be tested every 3 months for as long as you are taking PrEP.  PREGNANCY   If you are premenopausal and you may become pregnant, ask your health care provider about preconception  counseling.  If you may become pregnant, take 400 to 800 micrograms (mcg) of folic acid every day.  If you want to prevent pregnancy, talk to your health care provider about birth control (contraception). OSTEOPOROSIS AND MENOPAUSE   Osteoporosis is a disease in which the bones lose minerals and strength with aging. This can result in serious bone fractures. Your risk for osteoporosis can be identified using a bone density scan.  If you are 65 years of age or older, or if you are at risk for osteoporosis and fractures, ask your health care provider if you should be screened.  Ask your health care provider whether you should take a calcium or vitamin D supplement to lower your risk for osteoporosis.  Menopause may have certain physical symptoms and risks.  Hormone replacement therapy may reduce some of these symptoms and risks. Talk to your health care provider about whether hormone replacement therapy is right for you.  HOME CARE INSTRUCTIONS   Schedule regular health, dental, and eye exams.  Stay current with your immunizations.   Do not use any tobacco products including cigarettes, chewing tobacco, or electronic cigarettes.  If you are pregnant, do not drink alcohol.  If you are breastfeeding, limit how much and how often you drink alcohol.  Limit alcohol intake to no more than 1 drink per day for nonpregnant women. One drink equals 12 ounces of beer, 5 ounces of wine, or 1 ounces of hard liquor.  Do not use street drugs.  Do not share needles.  Ask your health care provider for help if you need support or information about quitting drugs.  Tell your health care provider if you often feel depressed.  Tell your health care provider if you have ever been abused or do not feel safe at home.   This information is  not intended to replace advice given to you by your health care provider. Make sure you discuss any questions you have with your health care provider.   Document  Released: 08/04/2010 Document Revised: 02/09/2014 Document Reviewed: 12/21/2012 Elsevier Interactive Patient Education [employer].

## 2015-12-11 NOTE — Assessment & Plan Note (Signed)
Stressed weight loss - decrease portions, healthy diet, increase exercise

## 2015-12-11 NOTE — Assessment & Plan Note (Signed)
Sugars have been well controlled by diet Check a1c

## 2015-12-11 NOTE — Progress Notes (Signed)
Subjective:    Patient ID: April [name], female    DOB: 13-Apr-1950, 65 y.o.   MRN: [id number]  HPI Here for medicare wellness exam.   I have personally reviewed and have noted 1.The patient's medical and social history 2.Their use of alcohol, tobacco or illicit drugs 3.Their current medications and supplements 4.The patient's functional ability including ADL's, fall risks, home safety risks and hearing or visual impairment. 5.Diet and physical activities 6.Evidence for depression or mood disorders 7.Care team reviewed - Neurology - Dr [name], Cardio - [place], Eye - [place], Ortho - Dr Maureen Ralphs, Derm  - Dr Ronnald Ramp   Are there smokers in your home (other than you)? No  Risk Factors Exercise:  Walking - daily few times in her hallway Dietary issues discussed: drinking a lot of water, avoids junk food, eats healthy  Cardiac risk factors: advanced age, hypertension, hyperlipidemia, and obesity (BMI >= 30 kg/m2).  Depression Screen  Have you felt down, depressed or hopeless? No  Have you felt little interest or pleasure in doing things?  No  Activities of Daily Living In your present state of health, do you have any difficulty performing the following activities?:  Driving? Yes - no longer drives Managing money?  No Feeding yourself? No Getting from bed to chair? No Climbing a flight of stairs? No Preparing food and eating?: yes - needs help - cant carry heavy things such as pots Bathing or showering? No Getting dressed: yes - difficulty zipping clothes, putting pants on Getting to/using the toilet? No Moving around from place to place: yes - due to arthritis, neuropathy, leg swelling - uses walking or cane at home In the past year have you fallen or had a near fall?: yes   Are you sexually active?  No  Do you have more than one partner?  N/A  Hearing Difficulties: No Do you often ask people to speak up or repeat  themselves? No Do you experience ringing or noises in your ears? No Do you have difficulty understanding soft or whispered voices? No Vision:              Any change in vision:  Sometimes blurry               Up to date with eye exam: yes Memory:  Do you feel that you have a problem with memory? No  Do you often misplace items? No  Do you feel safe at home?  Yes  Cognitive Testing  Alert, Orientated? Yes  Normal Appearance? Yes  Recall of three objects?  Yes  Can perform simple calculations? Yes  Displays appropriate judgment? Yes  Can read the correct time from a watch face? Yes   Advanced Directives have been discussed with the patient? Yes   Medications and allergies reviewed with patient and updated if appropriate.  Patient Active Problem List   Diagnosis Date Noted  . Cough 08/08/2015  . Hand paresthesia 07/18/2015  . Carpal tunnel syndrome 06/07/2015  . Family history of colon cancer   . Leg ulcer ([place]) 04/24/2015  . Diabetes mellitus with neurological manifestations ([place]) 04/18/2015  . Bilateral leg edema 04/11/2015  . Cellulitis of both lower extremities 04/11/2015  . Abnormality of gait 01/03/2015  . Hereditary and idiopathic peripheral neuropathy 01/03/2015  . GERD (gastroesophageal reflux disease) 06/17/2014  . Morbid obesity with BMI of 50.0-59.9, adult ([place]) 09/05/2013  . Hx of colonic polyps 12/15/2012  . Vitamin B12 deficiency 11/03/2012  . Intrinsic asthma 03/23/2012  .  Chronic diastolic heart failure ([place]) 02/20/2011  . OSA (obstructive sleep apnea) 09/17/2010  . URINARY URGENCY 01/08/2010  . CAD, NATIVE VESSEL 11/20/2008  . OSTEOARTHRITIS, KNEES, BILATERAL, SEVERE 06/14/2008  . Hyperlipidemia 05/10/2007  . Essential hypertension 01/18/2007  . HYPOKALEMIA 04/30/2006  . HX, PERSONAL, PEPTIC ULCER DISEASE 04/30/2006    Current Outpatient Prescriptions on File Prior to Visit  Medication Sig Dispense Refill  . aspirin (ECOTRIN LOW STRENGTH) 81 MG EC  tablet Take 81 mg by mouth at bedtime.     . budesonide-formoterol (SYMBICORT) 160-4.5 MCG/ACT inhaler one - two inhalations every 12 hours; gargle and spit after use (Patient taking differently: Inhale 2 puffs into the lungs 2 (two) times daily as needed (shortness of breath). gargle and spit after use) 1 Inhaler 4  . carvedilol (COREG) 25 MG tablet Take 1 tablet (25 mg total) by mouth 2 (two) times daily with a meal. 60 tablet 3  . Cod Liver Oil CAPS Take 1 capsule by mouth daily.     . cyanocobalamin (,VITAMIN B-12,) 1000 MCG/ML injection Inject 1 mL (1,000 mcg total) into the muscle every 30 (thirty) days. 2 mL 1  . docusate sodium (COLACE) 100 MG capsule Take 2-3 capsules (200-300 mg total) by mouth daily. 90 capsule 11  . DULoxetine (CYMBALTA) 60 MG capsule Take 1 capsule (60 mg total) by mouth daily. 30 capsule 12  . Flaxseed, Linseed, (FLAX SEED OIL) 1000 MG CAPS Take 1,000 mg by mouth 2 (two) times daily.     . folic acid (FOLVITE) 703 MCG tablet Take 800 mcg by mouth at bedtime.     . furosemide (LASIX) 40 MG tablet Take 1 tablet (40 mg total) by mouth daily. 90 tablet 5  . gabapentin (NEURONTIN) 300 MG capsule Take 1 capsule (300 mg total) by mouth 3 (three) times daily. (Patient taking differently: Take 300 mg by mouth 3 (three) times daily as needed (pain). ) 90 capsule 6  . glucose blood (ONE TOUCH ULTRA TEST) test strip Use to check blood sugars twice a day Dx E11.9 100 each 3  . hydrALAZINE (APRESOLINE) 50 MG tablet take 1 tablet by mouth every 8 hours 270 tablet 1  . Lancets (ONETOUCH ULTRASOFT) lancets Use to help check blood sugars twice a day Dx E11.9 100 each 3  . Multiple Vitamin (MULTIVITAMIN) tablet Take 1 tablet by mouth daily.      Marland Kitchen NEEDLE, DISP, 23 G (EASY TOUCH FLIPLOCK NEEDLES) 23G X 1-1/2" MISC Use to inject B12 once monthly. 2 each 0  . Omega-3 Fatty Acids (FISH OIL) 1000 MG CAPS Take 1,000 mg by mouth daily.     Marland Kitchen omeprazole (PRILOSEC) 40 MG capsule Take 1 capsule  (40 mg total) by mouth 2 (two) times daily. (Patient taking differently: Take 40 mg by mouth 2 (two) times daily as needed (acid reflux). ) 60 capsule 6  . potassium chloride SA (K-DUR,KLOR-CON) 20 MEQ tablet take 1 tablet by mouth once daily 90 tablet 3  . RA COL-RITE 100 MG capsule take 1 capsule by mouth twice a day if needed for MILD CONSTIPATION 60 capsule 0  . simvastatin (ZOCOR) 40 MG tablet Take 1 tablet (40 mg total) by mouth at bedtime. 90 tablet 3  . spironolactone (ALDACTONE) 25 MG tablet Take 1 tablet (25 mg total) by mouth daily. 30 tablet 5  . SYRINGE-NEEDLE, DISP, 3 ML 23G X 1" 3 ML MISC Supply to draw up B-12 from vial.  Separate needle sent in for injection.  2 each 1  . topiramate (TOPAMAX) 100 MG tablet Take 1 tablet (100 mg total) by mouth 2 (two) times daily. 60 tablet 11  . triamcinolone cream (KENALOG) 0.1 %   0  . verapamil (VERELAN PM) 120 MG 24 hr capsule take 1 capsule by mouth once daily 90 capsule 3  . albuterol (PROVENTIL HFA) 108 (90 BASE) MCG/ACT inhaler Inhale 2 puffs into the lungs every 4 (four) hours as needed for wheezing or shortness of breath. 1 Inhaler 4   No current facility-administered medications on file prior to visit.     Past Medical History:  Diagnosis Date  . Anemia   . Asthma   . CAD (coronary artery disease)   . Carpal tunnel syndrome   . CHF (congestive heart failure) ([place])   . Colon polyp, hyperplastic 2007 & 2012  . Complication of anesthesia 1999   svt with renal calculi surgery, no problems since  . CTS (carpal tunnel syndrome)   . Diabetes mellitus   . Eczema   . Hyperlipidemia   . Hypertension   . Meralgia paresthetica    Dr. [name]  . Morbid obesity ([place])   . Morbid obesity ([place])   . Neuropathy ([facility])    toes and legs  . Osteoarthrosis, unspecified whether generalized or localized, lower leg   . PUD (peptic ulcer disease)   . Shortness of breath dyspnea    with exertion  . Type II or unspecified type diabetes mellitus  without mention of complication, not stated as uncontrolled   . Unspecified hereditary and idiopathic peripheral neuropathy   . Vitamin B12 deficiency   . Wound cellulitis    right upper leg, healing well    Past Surgical History:  Procedure Laterality Date  . ABDOMINAL HYSTERECTOMY    . CARDIAC CATHETERIZATION  2002   non obstructive disease  . colonoscopy with polypectomy  2007 & 2012    hyperplastic ;Dr Watt Climes  . COLONOSCOPY WITH PROPOFOL N/A 06/04/2015   Procedure: COLONOSCOPY WITH PROPOFOL;  Surgeon: Jerene Bears, MD;  Location: [facility];  Service: Gastroenterology;  Laterality: N/A;  . DILATION AND CURETTAGE OF UTERUS     multiple  . HEMORRHOID SURGERY    . renal calculi  12/1997   SVT with induction of anesthesia  . right knee arthroscopy    . TONSILLECTOMY AND ADENOIDECTOMY      Social History   Social History  . Marital status: Married    Spouse name: [name]  . Number of children: 1  . Years of education: BS   Occupational History  . Disabled Retired   Social History Main Topics  . Smoking status: Never Smoker  . Smokeless tobacco: Never Used  . Alcohol use No  . Drug use: No  . Sexual activity: Not on file   Other Topics Concern  . Not on file   Social History Narrative   Patient lives at home with her husband [name] Patient is retired and has a Conservation officer, nature.    Caffeine - some times.   Right handed.    Family History  Problem Relation Age of Onset  . Colon cancer Mother   . Prostate cancer Father   . Colon cancer Father   . Diabetes Maternal Aunt   . Diabetes Maternal Uncle   . Diabetes Paternal Aunt   . Stroke Paternal Aunt     > 65  . Heart disease Paternal Aunt   . Diabetes Paternal Uncle   .  Breast cancer Maternal Aunt      X 2    Review of Systems  Constitutional: Positive for fatigue. Negative for appetite change (varies), chills and fever.  HENT: Negative for hearing loss and tinnitus.   Eyes: Negative for visual  disturbance.  Respiratory: Positive for cough (with asthma, intermittent), shortness of breath (with asthma, intermittent) and wheezing (with asthma, intermittent).   Cardiovascular: Positive for chest pain (occ), palpitations (with activity) and leg swelling.  Gastrointestinal: Positive for constipation (occ) and nausea (sometimes). Negative for abdominal pain, blood in stool and diarrhea.       Occ gerd - omeprazole prn  Endocrine: Positive for cold intolerance.  Genitourinary: Negative for dysuria and hematuria.  Musculoskeletal: Positive for arthralgias and back pain.  Skin: Negative for color change and rash.       Related to swelling in lower legs - changes in skin  Neurological: Positive for dizziness (with standing up quickly), light-headedness and headaches (sometimes).  Psychiatric/Behavioral: Negative for dysphoric mood. The patient is not nervous/anxious.        Objective:   Vitals:   12/11/15 1400  BP: (!) 162/90  Pulse: 79  Resp: 18  Temp: 98.6 F (37 C)   Filed Weights   12/11/15 1400  Weight: (!) 368 lb (166.9 kg)   Body mass index is 63.17 kg/m.   Physical Exam Constitutional: She appears well-developed and well-nourished. No distress.  HENT:  Head: Normocephalic and atraumatic.  Right Ear: External ear normal. Normal ear canal and TM Left Ear: External ear normal.  Normal ear canal and TM Mouth/Throat: Oropharynx is clear and moist.  Eyes: Conjunctivae and EOM are normal.  Neck: Neck supple. No tracheal deviation present. No thyromegaly present.  No carotid bruit  Cardiovascular: Normal rate, regular rhythm and normal heart sounds.   No murmur heard.  1 + edema b/l LE up to knee with chronic skin changes, no open wounds  Pulmonary/Chest: Effort normal and breath sounds normal. No respiratory distress. She has no wheezes. She has no rales.  Abdominal: Soft. She exhibits no distension. There is no tenderness.  Lymphadenopathy: She has no cervical  adenopathy.  Skin: Skin is warm and dry. She is not diaphoretic. no open wounds in legs Psychiatric: She has a normal mood and affect. Her behavior is normal.       Assessment & Plan:   Wellness Exam: Immunizations  Up to date  Colonoscopy  Up to date  Mammogram Up to date  Dexa - ordered Eye exam  Up to date  Hearing loss none Memory concerns/difficulties - no Independent of ADLs - not independent - requires assistance from husband to help her dress, unable to stand long periods, walks with cane or walker Stressed the importance of regular exercise and weight loss   Patient received copy of preventative screening tests/immunizations recommended for the next 5-10 years.  Physical exam: Screening blood work ordered Immunizations  Up to date  Colonoscopy  Up to date  Mammogram Up to date  Dexa - ordered Eye exam  Up to date  Exercise - exercising minimally - stressed increasing exercise Weight - discussed the importance of weight loss - this is the only thing that will likely improve her LE edema Skin  - sees derm, no concerns today Substance abuse - none  See Problem List for Assessment and Plan of chronic medical problems.  F/u in 3 months

## 2015-12-11 NOTE — Assessment & Plan Note (Signed)
BP Readings from Last 3 Encounters:  12/11/15 (!) 162/90  10/10/15 (!) 144/80  09/06/15 (!) 144/78   High today but typically controlled  Was having nurse at home check it - was controlled Work on weight loss Continue current medications cmp

## 2015-12-11 NOTE — Assessment & Plan Note (Signed)
Pain in legs, feet Taking neurontin

## 2015-12-11 NOTE — Assessment & Plan Note (Signed)
Fairly controlled now with spironolactone 25 mg daily, lasix 40 mg in AM and 20 mg in PM Check cmp Elevate legs when sitting Stressed low sodium diet Stressed weight loss

## 2015-12-11 NOTE — Assessment & Plan Note (Addendum)
GERD controlled Continue prn medication

## 2015-12-19 ENCOUNTER — Encounter: Payer: Self-pay | Admitting: Nurse Practitioner

## 2015-12-19 ENCOUNTER — Ambulatory Visit (INDEPENDENT_AMBULATORY_CARE_PROVIDER_SITE_OTHER): Payer: Medicare Other | Admitting: Nurse Practitioner

## 2015-12-19 VITALS — BP 144/88 | HR 76 | Temp 98.3°F | Ht 65.0 in | Wt 364.0 lb

## 2015-12-19 DIAGNOSIS — J069 Acute upper respiratory infection, unspecified: Secondary | ICD-10-CM | POA: Diagnosis not present

## 2015-12-19 DIAGNOSIS — R600 Localized edema: Secondary | ICD-10-CM | POA: Diagnosis not present

## 2015-12-19 DIAGNOSIS — L03115 Cellulitis of right lower limb: Secondary | ICD-10-CM

## 2015-12-19 MED ORDER — FLUTICASONE PROPIONATE 50 MCG/ACT NA SUSP: 2.0000 | Freq: Every day | NASAL | 0 refills | Status: DC

## 2015-12-19 MED ORDER — DOXYCYCLINE HYCLATE 100 MG PO TABS: 100.0000 mg | ORAL_TABLET | Freq: Two times a day (BID) | ORAL | 0 refills | Status: AC

## 2015-12-19 NOTE — Progress Notes (Signed)
Subjective:  Patient ID: Francena Hanly, female    DOB: [birth year]  Age: 65 y.o. MRN: [id number]  CC: Cellulitis (Pt stated let lower leg is swelling and have circle wound that is draining.)   URI   This is a new problem. The current episode started yesterday. The problem has been unchanged. There has been no fever. Associated symptoms include congestion, coughing, rhinorrhea, sneezing and a sore throat. She has tried nothing for the symptoms.    Outpatient Medications Prior to Visit  Medication Sig Dispense Refill  . albuterol (PROVENTIL HFA) 108 (90 BASE) MCG/ACT inhaler Inhale 2 puffs into the lungs every 4 (four) hours as needed for wheezing or shortness of breath. 1 Inhaler 4  . aspirin (ECOTRIN LOW STRENGTH) 81 MG EC tablet Take 81 mg by mouth at bedtime.     . budesonide-formoterol (SYMBICORT) 160-4.5 MCG/ACT inhaler one - two inhalations every 12 hours; gargle and spit after use (Patient taking differently: Inhale 2 puffs into the lungs 2 (two) times daily as needed (shortness of breath). gargle and spit after use) 1 Inhaler 4  . carvedilol (COREG) 25 MG tablet Take 1 tablet (25 mg total) by mouth 2 (two) times daily with a meal. 60 tablet 3  . Cod Liver Oil CAPS Take 1 capsule by mouth daily.     . cyanocobalamin (,VITAMIN B-12,) 1000 MCG/ML injection Inject 1 mL (1,000 mcg total) into the muscle every 30 (thirty) days. 2 mL 1  . docusate sodium (COLACE) 100 MG capsule Take 2-3 capsules (200-300 mg total) by mouth daily. 90 capsule 11  . DULoxetine (CYMBALTA) 60 MG capsule Take 1 capsule (60 mg total) by mouth daily. 30 capsule 12  . Flaxseed, Linseed, (FLAX SEED OIL) 1000 MG CAPS Take 1,000 mg by mouth 2 (two) times daily.     . folic acid (FOLVITE) 308 MCG tablet Take 800 mcg by mouth at bedtime.     . furosemide (LASIX) 40 MG tablet Take 40 mg in morning, take 20 mg in evening 135 tablet 5  . gabapentin (NEURONTIN) 300 MG capsule Take 1 capsule (300 mg total) by mouth 3 (three)  times daily. (Patient taking differently: Take 300 mg by mouth 3 (three) times daily as needed (pain). ) 90 capsule 6  . glucose blood (ONE TOUCH ULTRA TEST) test strip Use to check blood sugars twice a day Dx E11.9 100 each 3  . hydrALAZINE (APRESOLINE) 50 MG tablet take 1 tablet by mouth every 8 hours 270 tablet 1  . Lancets (ONETOUCH ULTRASOFT) lancets Use to help check blood sugars twice a day Dx E11.9 100 each 3  . Multiple Vitamin (MULTIVITAMIN) tablet Take 1 tablet by mouth daily.      Marland Kitchen NEEDLE, DISP, 23 G (EASY TOUCH FLIPLOCK NEEDLES) 23G X 1-1/2" MISC Use to inject B12 once monthly. 2 each 0  . Omega-3 Fatty Acids (FISH OIL) 1000 MG CAPS Take 1,000 mg by mouth daily.     Marland Kitchen omeprazole (PRILOSEC) 40 MG capsule Take 1 capsule (40 mg total) by mouth 2 (two) times daily. (Patient taking differently: Take 40 mg by mouth 2 (two) times daily as needed (acid reflux). ) 60 capsule 6  . potassium chloride SA (K-DUR,KLOR-CON) 20 MEQ tablet take 1 tablet by mouth once daily 90 tablet 3  . RA COL-RITE 100 MG capsule take 1 capsule by mouth twice a day if needed for MILD CONSTIPATION 60 capsule 0  . simvastatin (ZOCOR) 40 MG tablet Take 1  tablet (40 mg total) by mouth at bedtime. 90 tablet 3  . spironolactone (ALDACTONE) 25 MG tablet Take 1 tablet (25 mg total) by mouth daily. 30 tablet 5  . SYRINGE-NEEDLE, DISP, 3 ML 23G X 1" 3 ML MISC Supply to draw up B-12 from vial.  Separate needle sent in for injection. 2 each 1  . topiramate (TOPAMAX) 100 MG tablet Take 1 tablet (100 mg total) by mouth 2 (two) times daily. 60 tablet 11  . triamcinolone cream (KENALOG) 0.1 %   0  . verapamil (VERELAN PM) 120 MG 24 hr capsule take 1 capsule by mouth once daily 90 capsule 3   No facility-administered medications prior to visit.     ROS See HPI  Objective:  BP (!) 144/88 (BP Location: Left Arm, Patient Position: Sitting, Cuff Size: Normal)   Pulse 76   Temp 98.3 F (36.8 C)   Ht 5\' 5"  (1.651 m)   Wt (!)  364 lb (165.1 kg)   SpO2 98%   BMI 60.57 kg/m   BP Readings from Last 3 Encounters:  12/19/15 (!) 144/88  12/11/15 (!) 162/90  10/10/15 (!) 144/80    Wt Readings from Last 3 Encounters:  12/19/15 (!) 364 lb (165.1 kg)  12/11/15 (!) 368 lb (166.9 kg)  10/10/15 (!) 367 lb (166.5 kg)    Physical Exam  HENT:  Right Ear: Tympanic membrane, external ear and ear canal normal.  Left Ear: Tympanic membrane, external ear and ear canal normal.  Nose: Mucosal edema and rhinorrhea present. Right sinus exhibits no maxillary sinus tenderness and no frontal sinus tenderness. Left sinus exhibits no maxillary sinus tenderness and no frontal sinus tenderness.  Mouth/Throat: Posterior oropharyngeal erythema present. No oropharyngeal exudate.  Neck: Normal range of motion. Neck supple.  Cardiovascular: Normal rate and normal heart sounds.   Pulmonary/Chest: Effort normal and breath sounds normal.  Musculoskeletal: She exhibits edema and tenderness.  Lymphadenopathy:    She has no cervical adenopathy.  Skin:  Right lateral calf with increased warmth, blister and serous drainage, and tender to touch.  Vitals reviewed.   Lab Results  Component Value Date   WBC 6.4 12/11/2015   HGB 12.5 12/11/2015   HCT 38.9 12/11/2015   PLT 187.0 12/11/2015   GLUCOSE 91 12/11/2015   CHOL 158 12/21/2014   TRIG 67.0 12/21/2014   HDL 37.80 (L) 12/21/2014   LDLDIRECT 169.4 05/02/2007   LDLCALC 107 (H) 12/21/2014   ALT 10 12/11/2015   AST 12 12/11/2015   NA 143 12/11/2015   K 3.7 12/11/2015   CL 107 12/11/2015   CREATININE 1.22 (H) 12/11/2015   BUN 29 (H) 12/11/2015   CO2 29 12/11/2015   TSH 2.74 12/11/2015   INR 1.0 07/04/2006   HGBA1C 5.5 12/11/2015   MICROALBUR 1.0 04/10/2015    Dg Chest 2 View  Result Date: 04/11/2015 CLINICAL DATA:  Leg edema and pain with swelling. Hypertension diabetes. EXAM: CHEST  2 VIEW COMPARISON:  09/05/2013 FINDINGS: Atherosclerotic aortic arch. Thoracic spondylosis. Mild  enlargement of the cardiopericardial silhouette with indistinct upper zone pulmonary vasculature. No overt edema. No blunting of the costophrenic angles. Body habitus reduces diagnostic sensitivity and specificity. IMPRESSION: 1. Mild enlargement of the cardiopericardial silhouette with pulmonary venous hypertension but no overt edema. 2. Thoracic spondylosis. Electronically Signed   By: Van Clines M.D.   On: 04/11/2015 20:15   Dg Abd 1 View  Result Date: 04/12/2015 CLINICAL DATA:  Nausea, vomiting. EXAM: ABDOMEN - 1 VIEW COMPARISON:  None. FINDINGS: The bowel gas pattern is normal. Phleboliths are noted in the pelvis. IMPRESSION: There is no evidence of bowel obstruction or ileus. Electronically Signed   By: Marijo Conception, M.D.   On: 04/12/2015 16:37    Assessment & Plan:   Colbie was seen today for cellulitis.  Diagnoses and all orders for this visit:  Cellulitis of right lower extremity -     doxycycline (VIBRA-TABS) 100 MG tablet; Take 1 tablet (100 mg total) by mouth 2 (two) times daily.  Bilateral leg edema  Acute URI -     fluticasone (FLONASE) 50 MCG/ACT nasal spray; Place 2 sprays into both nostrils daily.   I am having Ms. Figuero start on doxycycline and fluticasone. I am also having her maintain her aspirin, Fish Oil, Flax Seed Oil, multivitamin, Cod Liver Oil, folic acid, albuterol, omeprazole, gabapentin, budesonide-formoterol, carvedilol, onetouch ultrasoft, NEEDLE (DISP) 23 G, topiramate, triamcinolone cream, DULoxetine, simvastatin, docusate sodium, SYRINGE-NEEDLE (DISP) 3 ML, glucose blood, cyanocobalamin, RA COL-RITE, spironolactone, verapamil, potassium chloride SA, hydrALAZINE, and furosemide.  Meds ordered this encounter  Medications  . doxycycline (VIBRA-TABS) 100 MG tablet    Sig: Take 1 tablet (100 mg total) by mouth 2 (two) times daily.    Dispense:  20 tablet    Refill:  0    Order Specific Question:   Supervising Provider    Answer:   Cassandria Anger [[id number]]  . fluticasone (FLONASE) 50 MCG/ACT nasal spray    Sig: Place 2 sprays into both nostrils daily.    Dispense:  16 g    Refill:  0    Order Specific Question:   Supervising Provider    Answer:   Cassandria Anger [[id number]]    Follow-up: Return in about 1 week (around 12/26/2015) for cellulitis.  Wilfred Lacy, NP

## 2015-12-19 NOTE — Progress Notes (Signed)
Pre visit review using our clinic review tool, if applicable. No additional management support is needed unless otherwise documented below in the visit note. 

## 2015-12-19 NOTE — Patient Instructions (Addendum)
Continue use of lasix as prescribed Return to office in 1week for re eval of cellulitis.  URI Instructions: Flonase and Afrin use: apply 1spray of afrin in each nare, wait 76mins, then apply 2sprays of flonase in each nare. Use both nasal spray consecutively x 3days, then flonase only for at least 14days.  Use over-the-counter  "cold" medicines  such as "Tylenol cold" , "Advil cold",  "Mucinex" or" Mucinex D"  for cough and congestion.  Avoid decongestants if you have high blood pressure. Use" Delsym" or" Robitussin" cough syrup varietis for cough.  You can use plain "Tylenol" or "Advi"l for fever, chills and achyness.   "Common cold" symptoms are usually triggered by a virus.  The antibiotics are usually not necessary. On average, a" viral cold" illness would take 4-7 days to resolve. Please, make an appointment if you are not better or if you're worse.

## 2015-12-24 ENCOUNTER — Encounter: Payer: Medicare PPO | Admitting: Internal Medicine

## 2015-12-25 ENCOUNTER — Telehealth: Payer: Self-pay | Admitting: Emergency Medicine

## 2015-12-25 ENCOUNTER — Other Ambulatory Visit: Payer: Self-pay | Admitting: Emergency Medicine

## 2015-12-25 DIAGNOSIS — E2839 Other primary ovarian failure: Secondary | ICD-10-CM

## 2015-12-25 NOTE — Telephone Encounter (Signed)
Pt called and wanted to let you know her wound came back open and is leaking a little still. She was suppose to inform you if this happened. Please advise thanks.

## 2015-12-25 NOTE — Telephone Encounter (Signed)
Per patient, no redness, not warm to touch, clear drainage-verified patient is taking lasix and spironolactone correctly----per [place], this is chronic condition and patient should continue dressing changes as discussed in previous office visit, unfortunately with chronic condition, fluid has to come out somehow, and she is weeping fluid from her legs---also advised patient to complete her antiobiotics as well

## 2015-12-31 ENCOUNTER — Ambulatory Visit: Payer: Medicare Other | Admitting: Nurse Practitioner

## 2015-12-31 ENCOUNTER — Ambulatory Visit (INDEPENDENT_AMBULATORY_CARE_PROVIDER_SITE_OTHER): Payer: Medicare Other | Admitting: Internal Medicine

## 2015-12-31 VITALS — BP 156/86 | HR 75 | Temp 98.1°F | Resp 16

## 2015-12-31 DIAGNOSIS — Z13820 Encounter for screening for osteoporosis: Secondary | ICD-10-CM

## 2015-12-31 DIAGNOSIS — N189 Chronic kidney disease, unspecified: Secondary | ICD-10-CM

## 2015-12-31 DIAGNOSIS — S80821A Blister (nonthermal), right lower leg, initial encounter: Secondary | ICD-10-CM

## 2015-12-31 DIAGNOSIS — L03115 Cellulitis of right lower limb: Secondary | ICD-10-CM

## 2015-12-31 DIAGNOSIS — R600 Localized edema: Secondary | ICD-10-CM | POA: Diagnosis not present

## 2015-12-31 DIAGNOSIS — Z780 Asymptomatic menopausal state: Secondary | ICD-10-CM

## 2015-12-31 DIAGNOSIS — L089 Local infection of the skin and subcutaneous tissue, unspecified: Secondary | ICD-10-CM

## 2015-12-31 MED ORDER — FUROSEMIDE 40 MG PO TABS: 40.0000 mg | ORAL_TABLET | Freq: Two times a day (BID) | ORAL | 5 refills | Status: DC

## 2015-12-31 MED ORDER — DOXYCYCLINE HYCLATE 100 MG PO TABS: 100.0000 mg | ORAL_TABLET | Freq: Two times a day (BID) | ORAL | 0 refills | Status: DC

## 2015-12-31 NOTE — Progress Notes (Signed)
Subjective:    Patient ID: April [name], female    DOB: 06-05-1950, 65 y.o.   MRN: [id number]  HPI The patient is here for follow up of her cellulitis.  Right lower leg cellulitis:  She was seen on 11/16 and diagnosed with right lower leg cellulitis and was put on doxycycline.  She took the last one two days ago.  She is unsure if there has been improvement and her husband is unsure as well.  She is not able to see the leg well.  She has two blisters in the right lower leg and one of them burst when she was in our parking lot today.  There was a yellowish fluid and it has been draining since it burst.  The blister is very painful; the leg is painful as well.  She has another blister than has not burst.  She denies fevers.  She feels more cold than usual, but always feels cold.    She feels she will need home health for wound care.  She has had a nurse come to the house in the past and it was very helpful.   Leg edema:  She continues to have persistent b/l leg edema/lymphedema.  She is taking the lasix 40 mg in the morning and 20 mg in the afternoon along with spironolactone 25 mg daily.  This has been increased over the past several months.  There has been some improvement in her edema, but is waxes and wanes.  She still has significant edema.  She is sedentary and not compliant with a low sodium diet.     Medications and allergies reviewed with patient and updated if appropriate.  Patient Active Problem List   Diagnosis Date Noted  . Hand paresthesia 07/18/2015  . Carpal tunnel syndrome 06/07/2015  . Family history of colon cancer   . Diabetes mellitus with neurological manifestations ([place]) 04/18/2015  . Bilateral leg edema 04/11/2015  . Abnormality of gait 01/03/2015  . Hereditary and idiopathic peripheral neuropathy 01/03/2015  . GERD (gastroesophageal reflux disease) 06/17/2014  . Morbid obesity with BMI of 50.0-59.9, adult ([place]) 09/05/2013  . Hx of colonic polyps 12/15/2012  .  Vitamin B12 deficiency 11/03/2012  . Intrinsic asthma 03/23/2012  . Chronic diastolic heart failure ([place]) 02/20/2011  . OSA (obstructive sleep apnea) 09/17/2010  . URINARY URGENCY 01/08/2010  . CAD, NATIVE VESSEL 11/20/2008  . OSTEOARTHRITIS, KNEES, BILATERAL, SEVERE 06/14/2008  . Hyperlipidemia 05/10/2007  . Essential hypertension 01/18/2007  . HYPOKALEMIA 04/30/2006  . HX, PERSONAL, PEPTIC ULCER DISEASE 04/30/2006    Current Outpatient Prescriptions on File Prior to Visit  Medication Sig Dispense Refill  . aspirin (ECOTRIN LOW STRENGTH) 81 MG EC tablet Take 81 mg by mouth at bedtime.     . budesonide-formoterol (SYMBICORT) 160-4.5 MCG/ACT inhaler one - two inhalations every 12 hours; gargle and spit after use (Patient taking differently: Inhale 2 puffs into the lungs 2 (two) times daily as needed (shortness of breath). gargle and spit after use) 1 Inhaler 4  . carvedilol (COREG) 25 MG tablet Take 1 tablet (25 mg total) by mouth 2 (two) times daily with a meal. 60 tablet 3  . Cod Liver Oil CAPS Take 1 capsule by mouth daily.     . cyanocobalamin (,VITAMIN B-12,) 1000 MCG/ML injection Inject 1 mL (1,000 mcg total) into the muscle every 30 (thirty) days. 2 mL 1  . docusate sodium (COLACE) 100 MG capsule Take 2-3 capsules (200-300 mg total) by mouth daily.  90 capsule 11  . DULoxetine (CYMBALTA) 60 MG capsule Take 1 capsule (60 mg total) by mouth daily. 30 capsule 12  . Flaxseed, Linseed, (FLAX SEED OIL) 1000 MG CAPS Take 1,000 mg by mouth 2 (two) times daily.     . fluticasone (FLONASE) 50 MCG/ACT nasal spray Place 2 sprays into both nostrils daily. 16 g 0  . folic acid (FOLVITE) 301 MCG tablet Take 800 mcg by mouth at bedtime.     . furosemide (LASIX) 40 MG tablet Take 40 mg in morning, take 20 mg in evening 135 tablet 5  . gabapentin (NEURONTIN) 300 MG capsule Take 1 capsule (300 mg total) by mouth 3 (three) times daily. (Patient taking differently: Take 300 mg by mouth 3 (three) times  daily as needed (pain). ) 90 capsule 6  . glucose blood (ONE TOUCH ULTRA TEST) test strip Use to check blood sugars twice a day Dx E11.9 100 each 3  . hydrALAZINE (APRESOLINE) 50 MG tablet take 1 tablet by mouth every 8 hours 270 tablet 1  . Lancets (ONETOUCH ULTRASOFT) lancets Use to help check blood sugars twice a day Dx E11.9 100 each 3  . Multiple Vitamin (MULTIVITAMIN) tablet Take 1 tablet by mouth daily.      Marland Kitchen NEEDLE, DISP, 23 G (EASY TOUCH FLIPLOCK NEEDLES) 23G X 1-1/2" MISC Use to inject B12 once monthly. 2 each 0  . Omega-3 Fatty Acids (FISH OIL) 1000 MG CAPS Take 1,000 mg by mouth daily.     Marland Kitchen omeprazole (PRILOSEC) 40 MG capsule Take 1 capsule (40 mg total) by mouth 2 (two) times daily. (Patient taking differently: Take 40 mg by mouth 2 (two) times daily as needed (acid reflux). ) 60 capsule 6  . potassium chloride SA (K-DUR,KLOR-CON) 20 MEQ tablet take 1 tablet by mouth once daily 90 tablet 3  . RA COL-RITE 100 MG capsule take 1 capsule by mouth twice a day if needed for MILD CONSTIPATION 60 capsule 0  . simvastatin (ZOCOR) 40 MG tablet Take 1 tablet (40 mg total) by mouth at bedtime. 90 tablet 3  . spironolactone (ALDACTONE) 25 MG tablet Take 1 tablet (25 mg total) by mouth daily. 30 tablet 5  . SYRINGE-NEEDLE, DISP, 3 ML 23G X 1" 3 ML MISC Supply to draw up B-12 from vial.  Separate needle sent in for injection. 2 each 1  . topiramate (TOPAMAX) 100 MG tablet Take 1 tablet (100 mg total) by mouth 2 (two) times daily. 60 tablet 11  . triamcinolone cream (KENALOG) 0.1 %   0  . verapamil (VERELAN PM) 120 MG 24 hr capsule take 1 capsule by mouth once daily 90 capsule 3  . albuterol (PROVENTIL HFA) 108 (90 BASE) MCG/ACT inhaler Inhale 2 puffs into the lungs every 4 (four) hours as needed for wheezing or shortness of breath. 1 Inhaler 4   No current facility-administered medications on file prior to visit.     Past Medical History:  Diagnosis Date  . Anemia   . Asthma   . CAD  (coronary artery disease)   . Carpal tunnel syndrome   . Cellulitis of both lower extremities 04/11/2015  . CHF (congestive heart failure) ([place])   . Colon polyp, hyperplastic 2007 & 2012  . Complication of anesthesia 1999   svt with renal calculi surgery, no problems since  . CTS (carpal tunnel syndrome)   . Diabetes mellitus   . Eczema   . Hyperlipidemia   . Hypertension   . Leg  ulcer ([place]) 04/24/2015   Right lateral leg No evidence of an infection Monitor closely Keep edema controlled   . Meralgia paresthetica    Dr. [name]  . Morbid obesity ([place])   . Morbid obesity ([employer])   . Neuropathy ([facility])    toes and legs  . Osteoarthrosis, unspecified whether generalized or localized, lower leg   . PUD (peptic ulcer disease)   . Shortness of breath dyspnea    with exertion  . Type II or unspecified type diabetes mellitus without mention of complication, not stated as uncontrolled   . Unspecified hereditary and idiopathic peripheral neuropathy   . Vitamin B12 deficiency   . Wound cellulitis    right upper leg, healing well    Past Surgical History:  Procedure Laterality Date  . ABDOMINAL HYSTERECTOMY    . CARDIAC CATHETERIZATION  2002   non obstructive disease  . colonoscopy with polypectomy  2007 & 2012    hyperplastic ;Dr Watt Climes  . COLONOSCOPY WITH PROPOFOL N/A 06/04/2015   Procedure: COLONOSCOPY WITH PROPOFOL;  Surgeon: Jerene Bears, MD;  Location: [facility];  Service: Gastroenterology;  Laterality: N/A;  . DILATION AND CURETTAGE OF UTERUS     multiple  . HEMORRHOID SURGERY    . renal calculi  12/1997   SVT with induction of anesthesia  . right knee arthroscopy    . TONSILLECTOMY AND ADENOIDECTOMY      Social History   Social History  . Marital status: Married    Spouse name: [name]  . Number of children: 1  . Years of education: BS   Occupational History  . Disabled Retired   Social History Main Topics  . Smoking status: Never Smoker  . Smokeless tobacco: Never Used    . Alcohol use No  . Drug use: No  . Sexual activity: Not on file   Other Topics Concern  . Not on file   Social History Narrative   Patient lives at home with her husband [name] Patient is retired and has a Conservation officer, nature.    Caffeine - some times.   Right handed.    Family History  Problem Relation Age of Onset  . Colon cancer Mother   . Prostate cancer Father   . Colon cancer Father   . Diabetes Maternal Aunt   . Diabetes Maternal Uncle   . Diabetes Paternal Aunt   . Stroke Paternal Aunt     > 65  . Heart disease Paternal Aunt   . Diabetes Paternal Uncle   . Breast cancer Maternal Aunt      X 2    Review of Systems  Constitutional: Negative for fever.  Respiratory: Positive for shortness of breath (chronic - unchanged, with exertion). Negative for cough and wheezing.   Cardiovascular: Positive for leg swelling. Negative for chest pain and palpitations.  Endocrine: Positive for cold intolerance.  Skin: Positive for color change (redness in right leg, blisters) and wound. Negative for rash.  Neurological: Positive for light-headedness and headaches (sometimes).       Objective:   Vitals:   12/31/15 1556  BP: (!) 156/86  Pulse: 75  Resp: 16  Temp: 98.1 F (36.7 C)   There were no vitals filed for this visit. There is no height or weight on file to calculate BMI.   Physical Exam    Constitutional: Appears well-developed and well-nourished. No distress.  HENT:  Head: Normocephalic and atraumatic.  Cardiovascular: Normal rate, regular rhythm and normal heart sounds.  No murmur heard. No carotid bruit .  2+ edema b/l LE Pulmonary/Chest: Effort normal and breath sounds normal. No respiratory distress. No has no wheezes. No rales.  Skin: Not diaphoretic. right lower leg with erythema and mild warmth to touch, tangerine sized blister on right lateral leg than is draining with pressure, smaller blister distal to that is not draining, no other wounds   Psychiatric: Normal mood and affect. Behavior is normal.      Assessment & Plan:    See Problem List for Assessment and Plan of chronic medical problems.

## 2015-12-31 NOTE — Patient Instructions (Signed)
Have blood work done at the end of the week or next week. \ Test(s) ordered today. Your results will be released to [place] (or called to you) after review, usually within 72hours after test completion. If any changes need to be made, you will be notified at that same time.  Medications reviewed and updated.  Changes include increasing lasix to 40 mg twice daily.  We will add an additional 7 days of antibiotic.   Your prescription(s) have been submitted to your pharmacy. Please take as directed and contact our office if you believe you are having problem(s) with the medication(s).    Please followup in 1 month

## 2015-12-31 NOTE — Progress Notes (Signed)
Pre visit review using our clinic review tool, if applicable. No additional management support is needed unless otherwise documented below in the visit note. 

## 2016-01-01 ENCOUNTER — Ambulatory Visit: Payer: Medicare Other | Admitting: Podiatry

## 2016-01-01 NOTE — Telephone Encounter (Signed)
Patient called to advise that the wound that you drained yesterday is filling back up. She states that it is wet as well. Please givwe her a call.

## 2016-01-01 NOTE — Telephone Encounter (Signed)
Please advise 

## 2016-01-01 NOTE — Telephone Encounter (Signed)
Home health will be contacting her.  They just need to keep covered at this time.

## 2016-01-02 ENCOUNTER — Encounter: Payer: Self-pay | Admitting: Internal Medicine

## 2016-01-02 DIAGNOSIS — S80821A Blister (nonthermal), right lower leg, initial encounter: Secondary | ICD-10-CM

## 2016-01-02 DIAGNOSIS — L089 Local infection of the skin and subcutaneous tissue, unspecified: Secondary | ICD-10-CM | POA: Insufficient documentation

## 2016-01-02 DIAGNOSIS — N183 Chronic kidney disease, stage 3 unspecified: Secondary | ICD-10-CM | POA: Insufficient documentation

## 2016-01-02 NOTE — Assessment & Plan Note (Signed)
Has worsened with increasing diuretics Will refer to nephrology for the assistance with fluid management / CKD

## 2016-01-02 NOTE — Telephone Encounter (Signed)
Spoke with pt to inform. Pt states that she is concerned that she can not get her feet warm. She states she is moving her feet and toes regularly. She puts them next to the heater and they dont get warm. I believe she is seeing someone for evaluation of her feet?

## 2016-01-02 NOTE — Telephone Encounter (Signed)
She has an appt with podiatry - she the podiatrist  Or nurse that visits her home feels there is a vascular issue we can refer to vascular.

## 2016-01-02 NOTE — Assessment & Plan Note (Signed)
?   Mild improvement since last week - there has likely been some improvement, but she is unable to see her leg and her husband has not been monitoring the leg so they can not say for sure - there is still some erythema and warmth Leg will be wrapped Additional doxycyline prescribed Will have home wound care assess and help with wound care

## 2016-01-02 NOTE — Assessment & Plan Note (Signed)
Cellulitis with possible infected blister, which did burst today -  Still draining slightly Leg will be wrapped Additional doxycyline prescribed Will have home wound care

## 2016-01-02 NOTE — Assessment & Plan Note (Addendum)
B/l chronic edema Increase lasix form 40/20 to 40 mg BID Continue spironolactone 25 mg daily I am having a difficult time controlling her edema - her kidney function has worsened She has done PT, but is still very sedentary.  She is not compliant with a diabetic diet -- stressed increased activity, weight loss and low sodium diet Will refer to nephrology for help with fluid management in the setting of CKD that will likely worsen

## 2016-01-06 ENCOUNTER — Ambulatory Visit: Payer: Medicare Other

## 2016-01-07 ENCOUNTER — Ambulatory Visit: Payer: Medicare Other | Admitting: Sports Medicine

## 2016-01-07 ENCOUNTER — Ambulatory Visit (INDEPENDENT_AMBULATORY_CARE_PROVIDER_SITE_OTHER): Payer: Medicare Other | Admitting: General Practice

## 2016-01-07 ENCOUNTER — Ambulatory Visit: Payer: Medicare Other

## 2016-01-07 ENCOUNTER — Other Ambulatory Visit (INDEPENDENT_AMBULATORY_CARE_PROVIDER_SITE_OTHER): Payer: Medicare Other

## 2016-01-07 ENCOUNTER — Telehealth: Payer: Self-pay | Admitting: Emergency Medicine

## 2016-01-07 ENCOUNTER — Telehealth: Payer: Self-pay | Admitting: Internal Medicine

## 2016-01-07 DIAGNOSIS — N189 Chronic kidney disease, unspecified: Secondary | ICD-10-CM

## 2016-01-07 DIAGNOSIS — E538 Deficiency of other specified B group vitamins: Secondary | ICD-10-CM | POA: Diagnosis not present

## 2016-01-07 LAB — COMPREHENSIVE METABOLIC PANEL
ALBUMIN: 3.6 g/dL (ref 3.5–5.2)
ALT: 11 U/L (ref 0–35)
AST: 14 U/L (ref 0–37)
Alkaline Phosphatase: 69 U/L (ref 39–117)
BILIRUBIN TOTAL: 0.6 mg/dL (ref 0.2–1.2)
BUN: 38 mg/dL — AB (ref 6–23)
CALCIUM: 9.3 mg/dL (ref 8.4–10.5)
CHLORIDE: 105 meq/L (ref 96–112)
CO2: 32 meq/L (ref 19–32)
CREATININE: 1.26 mg/dL — AB (ref 0.40–1.20)
GFR: 54.71 mL/min — ABNORMAL LOW (ref >60.00)
Glucose, Bld: 119 mg/dL — ABNORMAL HIGH (ref 70–99)
Potassium: 3.8 mEq/L (ref 3.5–5.1)
SODIUM: 144 meq/L (ref 135–145)
Total Protein: 6.6 g/dL (ref 6.0–8.3)

## 2016-01-07 MED ORDER — CYANOCOBALAMIN 1000 MCG/ML IJ SOLN
1000.0000 ug | Freq: Once | INTRAMUSCULAR | Status: AC
  Administered 2016-01-07: 1000 ug via INTRAMUSCULAR

## 2016-01-07 NOTE — Progress Notes (Signed)
Injection given.   Lesleigh Hughson J Emmanuelle Hibbitts, MD  

## 2016-01-07 NOTE — Telephone Encounter (Signed)
Yes, I will look at it - she may need to wait a little.

## 2016-01-07 NOTE — Telephone Encounter (Signed)
Pt called and stated her current wound is now open and draining a little blood. She has a new wound on the same leg now as well. She is coming in today at 2:00pm for her B12 injection. She wanted to know if you wanted to look at it while she was here. You dont have any available appts today and she only wants to see you. Please advise thanks.

## 2016-01-07 NOTE — Telephone Encounter (Signed)
Please call [facility]  - they saw patient last Friday and have not been back.  They told her they are waiting for orders from me and have not been able to get in touch with [place].  Please call them - she needs wound care tomorrow.

## 2016-01-07 NOTE — Telephone Encounter (Signed)
Left message on machine for pt to return my call  

## 2016-01-07 NOTE — Telephone Encounter (Signed)
Called [facility] at [phone] and they stated that had not received referral for pt, nor had they gone out to her home Friday.  Per Estill Bamberg at [facility], they would not require orders from [place] unless pt had been assessed by [facility]. Please review referral, thanks!

## 2016-01-08 ENCOUNTER — Telehealth: Payer: Self-pay | Admitting: Internal Medicine

## 2016-01-08 DIAGNOSIS — L089 Local infection of the skin and subcutaneous tissue, unspecified: Secondary | ICD-10-CM

## 2016-01-08 DIAGNOSIS — S80821A Blister (nonthermal), right lower leg, initial encounter: Principal | ICD-10-CM

## 2016-01-08 DIAGNOSIS — R600 Localized edema: Secondary | ICD-10-CM

## 2016-01-08 NOTE — Telephone Encounter (Signed)
Please advise 

## 2016-01-08 NOTE — Telephone Encounter (Signed)
Spoke with nurse with [facility], pt was seen last week, and will be seen 2 xs weekly. Pt is being non-compliant with orders from nurse, will only allow nurse to put vaseline bandage on.

## 2016-01-08 NOTE — Telephone Encounter (Signed)
LVM informing pt

## 2016-01-08 NOTE — Telephone Encounter (Signed)
See phone notes dated 01/08/2016

## 2016-01-08 NOTE — Telephone Encounter (Signed)
LVM for April [name] to call back for wound care orders.

## 2016-01-08 NOTE — Telephone Encounter (Signed)
ordered

## 2016-01-08 NOTE — Telephone Encounter (Signed)
Needs to verify correct wound care orders.

## 2016-01-08 NOTE — Telephone Encounter (Signed)
Patient states she was in yesterday and our office was trying to get orders to [facility].  Patient states she would like call back as soon as possible.

## 2016-01-09 NOTE — Telephone Encounter (Signed)
Spoke with Olivia Mackie with wound care. She wanted to make aware that they are using vaseline dressing on pts blister. She also informed that pt has another blister that appeared and they are dressing with gauze and ace wrap.

## 2016-01-10 ENCOUNTER — Ambulatory Visit
Admission: RE | Admit: 2016-01-10 | Discharge: 2016-01-10 | Disposition: A | Discharge status: Home / Self Care | Payer: Medicare Other | Source: Ambulatory Visit | Attending: Internal Medicine | Admitting: Internal Medicine

## 2016-01-10 DIAGNOSIS — E2839 Other primary ovarian failure: Secondary | ICD-10-CM

## 2016-01-11 ENCOUNTER — Encounter: Payer: Self-pay | Admitting: Internal Medicine

## 2016-01-11 DIAGNOSIS — M8580 Other specified disorders of bone density and structure, unspecified site: Secondary | ICD-10-CM | POA: Insufficient documentation

## 2016-01-11 DIAGNOSIS — M810 Age-related osteoporosis without current pathological fracture: Secondary | ICD-10-CM | POA: Insufficient documentation

## 2016-01-16 NOTE — Progress Notes (Signed)
Subjective:    Patient ID: April [name], female    DOB: 07-Dec-1950, 65 y.o.   MRN: [id number]  HPI The patient is here for follow up.  Leg edema, open wound/blister on right leg, recent cellulitis: she completed the antibiotic.  She has subjective fevers at times and she feels cold all the time, which is not new.  She has been taking 80 mg of lasix every morning with the spironolactone but she denies much change in her swelling. She is not able to see her legs well.     CKD:  She has an appt with nephrology 02/12/15.    Hypertension: She is taking her medication daily. She is not compliant with a low sodium diet.  She has chronic SOB that has not changed.  She denies chest pain, palpitations, and regular headaches.  She does not monitor her blood pressure at home.  She is very sedentary.    Because of her leg swelling and open ulcers on the right lower leg along with her morbid obesity she is having difficulty showering and especially keeping her legs/feet clean.   Medications and allergies reviewed with patient and updated if appropriate.  Patient Active Problem List   Diagnosis Date Noted  . Osteopenia 01/11/2016  . Chronic kidney disease 01/02/2016  . Infected blister of right leg 01/02/2016  . Hand paresthesia 07/18/2015  . Carpal tunnel syndrome 06/07/2015  . Family history of colon cancer   . Diabetes mellitus with neurological manifestations ([place]) 04/18/2015  . Bilateral leg edema 04/11/2015  . Cellulitis of leg, right 04/11/2015  . Abnormality of gait 01/03/2015  . Hereditary and idiopathic peripheral neuropathy 01/03/2015  . GERD (gastroesophageal reflux disease) 06/17/2014  . Morbid obesity with BMI of 50.0-59.9, adult ([place]) 09/05/2013  . Hx of colonic polyps 12/15/2012  . Vitamin B12 deficiency 11/03/2012  . Intrinsic asthma 03/23/2012  . Chronic diastolic heart failure ([place]) 02/20/2011  . OSA (obstructive sleep apnea) 09/17/2010  . URINARY URGENCY 01/08/2010  .  CAD, NATIVE VESSEL 11/20/2008  . OSTEOARTHRITIS, KNEES, BILATERAL, SEVERE 06/14/2008  . Hyperlipidemia 05/10/2007  . Essential hypertension 01/18/2007  . HYPOKALEMIA 04/30/2006  . HX, PERSONAL, PEPTIC ULCER DISEASE 04/30/2006    Current Outpatient Prescriptions on File Prior to Visit  Medication Sig Dispense Refill  . albuterol (PROVENTIL HFA) 108 (90 BASE) MCG/ACT inhaler Inhale 2 puffs into the lungs every 4 (four) hours as needed for wheezing or shortness of breath. 1 Inhaler 4  . aspirin (ECOTRIN LOW STRENGTH) 81 MG EC tablet Take 81 mg by mouth at bedtime.     . budesonide-formoterol (SYMBICORT) 160-4.5 MCG/ACT inhaler one - two inhalations every 12 hours; gargle and spit after use (Patient taking differently: Inhale 2 puffs into the lungs 2 (two) times daily as needed (shortness of breath). gargle and spit after use) 1 Inhaler 4  . carvedilol (COREG) 25 MG tablet Take 1 tablet (25 mg total) by mouth 2 (two) times daily with a meal. 60 tablet 3  . Cod Liver Oil CAPS Take 1 capsule by mouth daily.     . cyanocobalamin (,VITAMIN B-12,) 1000 MCG/ML injection Inject 1 mL (1,000 mcg total) into the muscle every 30 (thirty) days. 2 mL 1  . docusate sodium (COLACE) 100 MG capsule Take 2-3 capsules (200-300 mg total) by mouth daily. 90 capsule 11  . DULoxetine (CYMBALTA) 60 MG capsule Take 1 capsule (60 mg total) by mouth daily. 30 capsule 12  . Flaxseed, Linseed, (FLAX SEED OIL)  1000 MG CAPS Take 1,000 mg by mouth 2 (two) times daily.     . fluticasone (FLONASE) 50 MCG/ACT nasal spray Place 2 sprays into both nostrils daily. 16 g 0  . folic acid (FOLVITE) 222 MCG tablet Take 800 mcg by mouth at bedtime.     . furosemide (LASIX) 40 MG tablet Take 1 tablet (40 mg total) by mouth 2 (two) times daily. 180 tablet 5  . gabapentin (NEURONTIN) 300 MG capsule Take 1 capsule (300 mg total) by mouth 3 (three) times daily. (Patient taking differently: Take 300 mg by mouth 3 (three) times daily as needed  (pain). ) 90 capsule 6  . glucose blood (ONE TOUCH ULTRA TEST) test strip Use to check blood sugars twice a day Dx E11.9 100 each 3  . hydrALAZINE (APRESOLINE) 50 MG tablet take 1 tablet by mouth every 8 hours 270 tablet 1  . Lancets (ONETOUCH ULTRASOFT) lancets Use to help check blood sugars twice a day Dx E11.9 100 each 3  . Multiple Vitamin (MULTIVITAMIN) tablet Take 1 tablet by mouth daily.      Marland Kitchen NEEDLE, DISP, 23 G (EASY TOUCH FLIPLOCK NEEDLES) 23G X 1-1/2" MISC Use to inject B12 once monthly. 2 each 0  . Omega-3 Fatty Acids (FISH OIL) 1000 MG CAPS Take 1,000 mg by mouth daily.     Marland Kitchen omeprazole (PRILOSEC) 40 MG capsule Take 1 capsule (40 mg total) by mouth 2 (two) times daily. (Patient taking differently: Take 40 mg by mouth 2 (two) times daily as needed (acid reflux). ) 60 capsule 6  . potassium chloride SA (K-DUR,KLOR-CON) 20 MEQ tablet take 1 tablet by mouth once daily 90 tablet 3  . RA COL-RITE 100 MG capsule take 1 capsule by mouth twice a day if needed for MILD CONSTIPATION 60 capsule 0  . simvastatin (ZOCOR) 40 MG tablet Take 1 tablet (40 mg total) by mouth at bedtime. 90 tablet 3  . spironolactone (ALDACTONE) 25 MG tablet Take 1 tablet (25 mg total) by mouth daily. 30 tablet 5  . SYRINGE-NEEDLE, DISP, 3 ML 23G X 1" 3 ML MISC Supply to draw up B-12 from vial.  Separate needle sent in for injection. 2 each 1  . topiramate (TOPAMAX) 100 MG tablet Take 1 tablet (100 mg total) by mouth 2 (two) times daily. 60 tablet 11  . triamcinolone cream (KENALOG) 0.1 %   0  . verapamil (VERELAN PM) 120 MG 24 hr capsule take 1 capsule by mouth once daily 90 capsule 3   No current facility-administered medications on file prior to visit.     Past Medical History:  Diagnosis Date  . Anemia   . Asthma   . CAD (coronary artery disease)   . Carpal tunnel syndrome   . Cellulitis of both lower extremities 04/11/2015  . CHF (congestive heart failure) ([place])   . Colon polyp, hyperplastic 2007 & 2012  .  Complication of anesthesia 1999   svt with renal calculi surgery, no problems since  . CTS (carpal tunnel syndrome)   . Diabetes mellitus   . Eczema   . Hyperlipidemia   . Hypertension   . Leg ulcer ([place]) 04/24/2015   Right lateral leg No evidence of an infection Monitor closely Keep edema controlled   . Meralgia paresthetica    Dr. [name]  . Morbid obesity ([place])   . Morbid obesity ([place])   . Neuropathy (HCC)    toes and legs  . Osteoarthrosis, unspecified whether generalized or localized,  lower leg   . PUD (peptic ulcer disease)   . Shortness of breath dyspnea    with exertion  . Type II or unspecified type diabetes mellitus without mention of complication, not stated as uncontrolled   . Unspecified hereditary and idiopathic peripheral neuropathy   . Vitamin B12 deficiency   . Wound cellulitis    right upper leg, healing well    Past Surgical History:  Procedure Laterality Date  . ABDOMINAL HYSTERECTOMY    . CARDIAC CATHETERIZATION  2002   non obstructive disease  . colonoscopy with polypectomy  2007 & 2012    hyperplastic ;Dr Watt Climes  . COLONOSCOPY WITH PROPOFOL N/A 06/04/2015   Procedure: COLONOSCOPY WITH PROPOFOL;  Surgeon: Jerene Bears, MD;  Location: [facility];  Service: Gastroenterology;  Laterality: N/A;  . DILATION AND CURETTAGE OF UTERUS     multiple  . HEMORRHOID SURGERY    . renal calculi  12/1997   SVT with induction of anesthesia  . right knee arthroscopy    . TONSILLECTOMY AND ADENOIDECTOMY      Social History   Social History  . Marital status: Married    Spouse name: [name]  . Number of children: 1  . Years of education: BS   Occupational History  . Disabled Retired   Social History Main Topics  . Smoking status: Never Smoker  . Smokeless tobacco: Never Used  . Alcohol use No  . Drug use: No  . Sexual activity: Not Asked   Other Topics Concern  . None   Social History Narrative   Patient lives at home with her husband [name] Patient is  retired and has a Conservation officer, nature.    Caffeine - some times.   Right handed.    Family History  Problem Relation Age of Onset  . Colon cancer Mother   . Prostate cancer Father   . Colon cancer Father   . Diabetes Maternal Aunt   . Diabetes Maternal Uncle   . Diabetes Paternal Aunt   . Stroke Paternal Aunt     > 65  . Heart disease Paternal Aunt   . Diabetes Paternal Uncle   . Breast cancer Maternal Aunt      X 2    Review of Systems  Constitutional: Negative for fever.  Respiratory: Positive for shortness of breath (chronic , no change).   Cardiovascular: Positive for leg swelling. Negative for chest pain.  Endocrine: Positive for cold intolerance.  Neurological: Negative for headaches.       Objective:   Vitals:   01/17/16 1304  BP: (!) 146/88  Pulse: 73  Temp: 97.8 F (36.6 C)   Filed Weights   01/17/16 1304  Weight: (!) 374 lb (169.6 kg)   Body mass index is 62.24 kg/m.   Physical Exam    Constitutional: Morbidly obese, in wheelchair. No distress.  HENT:  Head: Normocephalic and atraumatic.  Neck: Neck supple. No tracheal deviation present. No thyromegaly present.  No cervical lymphadenopathy Cardiovascular: Normal rate, regular rhythm and normal heart sounds.   No murmur heard. No carotid bruit .  2+b/l LE edema Pulmonary/Chest: Effort normal and breath sounds normal. No respiratory distress. No has no wheezes. No rales.  Skin: Skin is warm and dry. Not diaphoretic. two ulcers on right lateral lower leg with clear fluid discharge, no pus,  No significant erythema - mild erythema secondary to swelling.  Legs tender. Psychiatric: Normal mood and affect. Behavior is normal.  Assessment & Plan:     See Problem List for Assessment and Plan of chronic medical problems.

## 2016-01-17 ENCOUNTER — Encounter: Payer: Self-pay | Admitting: Internal Medicine

## 2016-01-17 ENCOUNTER — Other Ambulatory Visit (INDEPENDENT_AMBULATORY_CARE_PROVIDER_SITE_OTHER): Payer: Medicare Other

## 2016-01-17 ENCOUNTER — Ambulatory Visit (INDEPENDENT_AMBULATORY_CARE_PROVIDER_SITE_OTHER): Payer: Medicare Other | Admitting: Internal Medicine

## 2016-01-17 VITALS — BP 146/88 | HR 73 | Temp 97.8°F | Wt 374.0 lb

## 2016-01-17 DIAGNOSIS — N189 Chronic kidney disease, unspecified: Secondary | ICD-10-CM

## 2016-01-17 DIAGNOSIS — L03115 Cellulitis of right lower limb: Secondary | ICD-10-CM | POA: Diagnosis not present

## 2016-01-17 DIAGNOSIS — I10 Essential (primary) hypertension: Secondary | ICD-10-CM

## 2016-01-17 DIAGNOSIS — E538 Deficiency of other specified B group vitamins: Secondary | ICD-10-CM

## 2016-01-17 DIAGNOSIS — R600 Localized edema: Secondary | ICD-10-CM | POA: Diagnosis not present

## 2016-01-17 DIAGNOSIS — S80821A Blister (nonthermal), right lower leg, initial encounter: Secondary | ICD-10-CM

## 2016-01-17 DIAGNOSIS — L089 Local infection of the skin and subcutaneous tissue, unspecified: Secondary | ICD-10-CM

## 2016-01-17 LAB — BASIC METABOLIC PANEL
BUN: 30 mg/dL — AB (ref 6–23)
CO2: 32 mEq/L (ref 19–32)
Calcium: 9.2 mg/dL (ref 8.4–10.5)
Chloride: 106 mEq/L (ref 96–112)
Creatinine, Ser: 1.21 mg/dL — ABNORMAL HIGH (ref 0.40–1.20)
GFR: 57.32 mL/min — AB (ref >60.00)
GLUCOSE: 96 mg/dL (ref 70–99)
POTASSIUM: 3.8 meq/L (ref 3.5–5.1)
Sodium: 144 mEq/L (ref 135–145)

## 2016-01-17 NOTE — Patient Instructions (Signed)
  Test(s) ordered today. Your results will be released to [place] (or called to you) after review, usually within 72hours after test completion. If any changes need to be made, you will be notified at that same time.   Medications reviewed and updated.  Changes include taking the lasix 40 mg twice a day again.     Please followup the end of January

## 2016-01-17 NOTE — Progress Notes (Signed)
Pre visit review using our clinic review tool, if applicable. No additional management support is needed unless otherwise documented below in the visit note. 

## 2016-01-19 ENCOUNTER — Encounter: Payer: Self-pay | Admitting: Internal Medicine

## 2016-01-19 MED ORDER — "NEEDLE (DISP) 23G X 1-1/2"" MISC": 0 refills | Status: DC

## 2016-01-19 MED ORDER — CYANOCOBALAMIN 1000 MCG/ML IJ SOLN: 1000.0000 ug | INTRAMUSCULAR | 1 refills | Status: DC

## 2016-01-19 NOTE — Assessment & Plan Note (Signed)
Slightly elevated today Well controlled at home when nurse checks it Continue to monitor at home Continue current meds

## 2016-01-19 NOTE — Assessment & Plan Note (Signed)
Bmp today Has nephrology appt 1/10

## 2016-01-19 NOTE — Assessment & Plan Note (Signed)
Resolved Monitor closely for recurrence No additional antibiotics now

## 2016-01-19 NOTE — Assessment & Plan Note (Signed)
Will have nurse do it at home while they are seeing her Supplies sent to pharmacy

## 2016-01-19 NOTE — Assessment & Plan Note (Signed)
Severe  I have stressed being more active, losing weight, better compliance with low sodium diet and elevating legs Taking spironolactone 25 mg in morning and 80 mg of lasix in morning - the 80 mg of lasix daily was not more effective than 40 mg twice daily, so will go back to 40 mg twice daily Discussed trying metolazone but she would prefer to hold off until she sees nephrology Has not tolerated torsemide in the past May need IV diuresis, but this is a chronic problem although slightly worse now Hopefully nephrology can help with the fluid balance with her CKD

## 2016-01-19 NOTE — Assessment & Plan Note (Signed)
No evidence of active infection Has home wound care 2/week - continue

## 2016-01-20 ENCOUNTER — Telehealth: Payer: Self-pay | Admitting: Emergency Medicine

## 2016-01-20 NOTE — Telephone Encounter (Signed)
Spoke with Estill Bamberg, LPN with [facility]. Gave verbal orders per MD for B12 injections every 30 days. And pt has an RN coming to home today for a visit and will assess pt for bathing.

## 2016-01-21 ENCOUNTER — Other Ambulatory Visit: Payer: Self-pay | Admitting: Emergency Medicine

## 2016-01-21 ENCOUNTER — Ambulatory Visit (INDEPENDENT_AMBULATORY_CARE_PROVIDER_SITE_OTHER): Payer: Medicare Other | Admitting: Sports Medicine

## 2016-01-21 ENCOUNTER — Encounter: Payer: Self-pay | Admitting: Sports Medicine

## 2016-01-21 DIAGNOSIS — M79674 Pain in right toe(s): Secondary | ICD-10-CM | POA: Diagnosis not present

## 2016-01-21 DIAGNOSIS — M2141 Flat foot [pes planus] (acquired), right foot: Secondary | ICD-10-CM | POA: Diagnosis not present

## 2016-01-21 DIAGNOSIS — E1142 Type 2 diabetes mellitus with diabetic polyneuropathy: Secondary | ICD-10-CM | POA: Diagnosis not present

## 2016-01-21 DIAGNOSIS — B351 Tinea unguium: Secondary | ICD-10-CM

## 2016-01-21 DIAGNOSIS — I890 Lymphedema, not elsewhere classified: Secondary | ICD-10-CM

## 2016-01-21 DIAGNOSIS — M2142 Flat foot [pes planus] (acquired), left foot: Secondary | ICD-10-CM | POA: Diagnosis not present

## 2016-01-21 DIAGNOSIS — L84 Corns and callosities: Secondary | ICD-10-CM

## 2016-01-21 DIAGNOSIS — M79675 Pain in left toe(s): Secondary | ICD-10-CM

## 2016-01-21 MED ORDER — "SYRINGE/NEEDLE (DISP) 23G X 1"" 3 ML MISC": 0 refills | Status: DC

## 2016-01-21 NOTE — Patient Instructions (Signed)

## 2016-01-21 NOTE — Progress Notes (Signed)
Subjective: April [name] is a 65 y.o. female patient with history of diabetes who presents to office today complaining of long, painful nails while ambulating in shoes; unable to trim. Patient states that the glucose reading this morning was 122mg /dl. Patient denies any new changes in medication or new problems. Patient denies any new cramping, numbness, burning or tingling in the legs.  Patient Active Problem List   Diagnosis Date Noted  . Osteopenia 01/11/2016  . Chronic kidney disease 01/02/2016  . Infected blister of right leg 01/02/2016  . Hand paresthesia 07/18/2015  . Carpal tunnel syndrome 06/07/2015  . Family history of colon cancer   . Diabetes mellitus with neurological manifestations ([place]) 04/18/2015  . Bilateral leg edema 04/11/2015  . Cellulitis of leg, right 04/11/2015  . Abnormality of gait 01/03/2015  . Hereditary and idiopathic peripheral neuropathy 01/03/2015  . GERD (gastroesophageal reflux disease) 06/17/2014  . Morbid obesity with BMI of 50.0-59.9, adult ([place]) 09/05/2013  . Hx of colonic polyps 12/15/2012  . Vitamin B12 deficiency 11/03/2012  . Intrinsic asthma 03/23/2012  . Chronic diastolic heart failure ([place]) 02/20/2011  . OSA (obstructive sleep apnea) 09/17/2010  . URINARY URGENCY 01/08/2010  . CAD, NATIVE VESSEL 11/20/2008  . OSTEOARTHRITIS, KNEES, BILATERAL, SEVERE 06/14/2008  . Hyperlipidemia 05/10/2007  . Essential hypertension 01/18/2007  . HYPOKALEMIA 04/30/2006  . HX, PERSONAL, PEPTIC ULCER DISEASE 04/30/2006   Current Outpatient Prescriptions on File Prior to Visit  Medication Sig Dispense Refill  . albuterol (PROVENTIL HFA) 108 (90 BASE) MCG/ACT inhaler Inhale 2 puffs into the lungs every 4 (four) hours as needed for wheezing or shortness of breath. 1 Inhaler 4  . aspirin (ECOTRIN LOW STRENGTH) 81 MG EC tablet Take 81 mg by mouth at bedtime.     . budesonide-formoterol (SYMBICORT) 160-4.5 MCG/ACT inhaler one - two inhalations every 12 hours;  gargle and spit after use (Patient taking differently: Inhale 2 puffs into the lungs 2 (two) times daily as needed (shortness of breath). gargle and spit after use) 1 Inhaler 4  . carvedilol (COREG) 25 MG tablet Take 1 tablet (25 mg total) by mouth 2 (two) times daily with a meal. 60 tablet 3  . Cod Liver Oil CAPS Take 1 capsule by mouth daily.     . cyanocobalamin (,VITAMIN B-12,) 1000 MCG/ML injection Inject 1 mL (1,000 mcg total) into the muscle every 30 (thirty) days. 2 mL 1  . docusate sodium (COLACE) 100 MG capsule Take 2-3 capsules (200-300 mg total) by mouth daily. 90 capsule 11  . DULoxetine (CYMBALTA) 60 MG capsule Take 1 capsule (60 mg total) by mouth daily. 30 capsule 12  . Flaxseed, Linseed, (FLAX SEED OIL) 1000 MG CAPS Take 1,000 mg by mouth 2 (two) times daily.     . fluticasone (FLONASE) 50 MCG/ACT nasal spray Place 2 sprays into both nostrils daily. 16 g 0  . folic acid (FOLVITE) 253 MCG tablet Take 800 mcg by mouth at bedtime.     . furosemide (LASIX) 40 MG tablet Take 1 tablet (40 mg total) by mouth 2 (two) times daily. 180 tablet 5  . gabapentin (NEURONTIN) 300 MG capsule Take 1 capsule (300 mg total) by mouth 3 (three) times daily. (Patient taking differently: Take 300 mg by mouth 3 (three) times daily as needed (pain). ) 90 capsule 6  . glucose blood (ONE TOUCH ULTRA TEST) test strip Use to check blood sugars twice a day Dx E11.9 100 each 3  . hydrALAZINE (APRESOLINE) 50 MG tablet take  1 tablet by mouth every 8 hours 270 tablet 1  . Lancets (ONETOUCH ULTRASOFT) lancets Use to help check blood sugars twice a day Dx E11.9 100 each 3  . Multiple Vitamin (MULTIVITAMIN) tablet Take 1 tablet by mouth daily.      Marland Kitchen NEEDLE, DISP, 23 G (EASY TOUCH FLIPLOCK NEEDLES) 23G X 1-1/2" MISC Use to inject B12 once monthly. 2 each 0  . Omega-3 Fatty Acids (FISH OIL) 1000 MG CAPS Take 1,000 mg by mouth daily.     Marland Kitchen omeprazole (PRILOSEC) 40 MG capsule Take 1 capsule (40 mg total) by mouth 2 (two)  times daily. (Patient taking differently: Take 40 mg by mouth 2 (two) times daily as needed (acid reflux). ) 60 capsule 6  . potassium chloride SA (K-DUR,KLOR-CON) 20 MEQ tablet take 1 tablet by mouth once daily 90 tablet 3  . RA COL-RITE 100 MG capsule take 1 capsule by mouth twice a day if needed for MILD CONSTIPATION 60 capsule 0  . simvastatin (ZOCOR) 40 MG tablet Take 1 tablet (40 mg total) by mouth at bedtime. 90 tablet 3  . spironolactone (ALDACTONE) 25 MG tablet Take 1 tablet (25 mg total) by mouth daily. 30 tablet 5  . topiramate (TOPAMAX) 100 MG tablet Take 1 tablet (100 mg total) by mouth 2 (two) times daily. 60 tablet 11  . triamcinolone cream (KENALOG) 0.1 %   0  . verapamil (VERELAN PM) 120 MG 24 hr capsule take 1 capsule by mouth once daily 90 capsule 3   No current facility-administered medications on file prior to visit.    Allergies  Allergen Reactions  . Penicillins Rash    She was told not to take it anymore. Has patient had a PCN reaction causing immediate rash, facial/tongue/throat swelling, SOB or lightheadedness with hypotension: Yes Has patient had a PCN reaction causing severe rash involving mucus membranes or skin necrosis: No Has patient had a PCN reaction that required hospitalization: No Has patient had a PCN reaction occurring within the last 10 years: No If all of the above answers are "NO", then may proceed with Cephalosporin use.'  . Sulfonamide Derivatives Anaphylaxis    REACTION: internal "burning"  . Benazepril Hcl Other (See Comments)    Patient unsure of reaction.  . Hydrochlorothiazide W-Triamterene Other (See Comments)    Hypokalemia  . Metronidazole Other (See Comments)    Patient unsure of reaction.  Marland Kitchen Spironolactone     "kidney problems"  . Torsemide Other (See Comments)    Patient unsure of reaction.  . Valsartan Other (See Comments)    Patient unsure of reaction.    Recent Results (from the past 2160 hour(s))  Basic Metabolic Panel  (BMET)     Status: None   Collection Time: 11/05/15  3:14 PM  Result Value Ref Range   Sodium 144 135 - 145 mEq/L   Potassium 3.7 3.5 - 5.1 mEq/L   Chloride 108 96 - 112 mEq/L   CO2 29 19 - 32 mEq/L   Glucose, Bld 81 70 - 99 mg/dL   BUN 22 6 - 23 mg/dL   Creatinine, Ser 1.14 0.40 - 1.20 mg/dL   Calcium 8.7 8.4 - 10.5 mg/dL   GFR 61.44 >60.00 mL/min  Comprehensive metabolic panel     Status: Abnormal   Collection Time: 12/11/15  3:25 PM  Result Value Ref Range   Sodium 143 135 - 145 mEq/L   Potassium 3.7 3.5 - 5.1 mEq/L   Chloride 107 96 -  112 mEq/L   CO2 29 19 - 32 mEq/L   Glucose, Bld 91 70 - 99 mg/dL   BUN 29 (H) 6 - 23 mg/dL   Creatinine, Ser 1.22 (H) 0.40 - 1.20 mg/dL   Total Bilirubin 0.5 0.2 - 1.2 mg/dL   Alkaline Phosphatase 54 39 - 117 U/L   AST 12 0 - 37 U/L   ALT 10 0 - 35 U/L   Total Protein 7.1 6.0 - 8.3 g/dL   Albumin 4.1 3.5 - 5.2 g/dL   Calcium 9.5 8.4 - 10.5 mg/dL   GFR 56.80 (L) >60.00 mL/min  Hemoglobin A1c     Status: None   Collection Time: 12/11/15  3:25 PM  Result Value Ref Range   Hgb A1c MFr Bld 5.5 4.6 - 6.5 %    Comment: Glycemic Control Guidelines for People with Diabetes:Non Diabetic:  <6%Goal of Therapy: <7%Additional Action Suggested:  >8%   CBC with Differential/Platelet     Status: None   Collection Time: 12/11/15  3:25 PM  Result Value Ref Range   WBC 6.4 4.0 - 10.5 K/uL   RBC 4.35 3.87 - 5.11 Mil/uL   Hemoglobin 12.5 12.0 - 15.0 g/dL   HCT 38.9 36.0 - 46.0 %   MCV 89.3 78.0 - 100.0 fl   MCHC 32.2 30.0 - 36.0 g/dL   RDW 15.4 11.5 - 15.5 %   Platelets 187.0 150.0 - 400.0 K/uL   Neutrophils Relative % 61.9 43.0 - 77.0 %   Lymphocytes Relative 26.8 12.0 - 46.0 %   Monocytes Relative 8.6 3.0 - 12.0 %   Eosinophils Relative 1.4 0.0 - 5.0 %   Basophils Relative 1.3 0.0 - 3.0 %   Neutro Abs 3.9 1.4 - 7.7 K/uL   Lymphs Abs 1.7 0.7 - 4.0 K/uL   Monocytes Absolute 0.5 0.1 - 1.0 K/uL   Eosinophils Absolute 0.1 0.0 - 0.7 K/uL   Basophils  Absolute 0.1 0.0 - 0.1 K/uL  TSH     Status: None   Collection Time: 12/11/15  3:25 PM  Result Value Ref Range   TSH 2.74 0.35 - 4.50 uIU/mL  Comprehensive metabolic panel     Status: Abnormal   Collection Time: 01/07/16  2:17 PM  Result Value Ref Range   Sodium 144 135 - 145 mEq/L   Potassium 3.8 3.5 - 5.1 mEq/L   Chloride 105 96 - 112 mEq/L   CO2 32 19 - 32 mEq/L   Glucose, Bld 119 (H) 70 - 99 mg/dL   BUN 38 (H) 6 - 23 mg/dL   Creatinine, Ser 1.26 (H) 0.40 - 1.20 mg/dL   Total Bilirubin 0.6 0.2 - 1.2 mg/dL   Alkaline Phosphatase 69 39 - 117 U/L   AST 14 0 - 37 U/L   ALT 11 0 - 35 U/L   Total Protein 6.6 6.0 - 8.3 g/dL   Albumin 3.6 3.5 - 5.2 g/dL   Calcium 9.3 8.4 - 10.5 mg/dL   GFR 54.71 (L) >60.00 mL/min  Basic Metabolic Panel (BMET)     Status: Abnormal   Collection Time: 01/17/16 12:15 PM  Result Value Ref Range   Sodium 144 135 - 145 mEq/L   Potassium 3.8 3.5 - 5.1 mEq/L   Chloride 106 96 - 112 mEq/L   CO2 32 19 - 32 mEq/L   Glucose, Bld 96 70 - 99 mg/dL   BUN 30 (H) 6 - 23 mg/dL   Creatinine, Ser 1.21 (H) 0.40 - 1.20 mg/dL  Calcium 9.2 8.4 - 10.5 mg/dL   GFR 57.32 (L) >60.00 mL/min    Objective: General: Patient is awake, alert, and oriented x 3 and in no acute distress.  Integument: Skin is warm, dry and supple bilateral. Nails are tender, long, thickened and dystrophic with subungual debris, consistent with onychomycosis, 1-5 bilateral. No signs of infection. No open lesions or preulcerative lesions present bilateral. Unna boots on legs bilateral patient goes to wound care center. Callus to heels bilateral. Remaining integument unremarkable.  Vasculature:  Dorsalis Pedis pulse 0/4 bilateral. Posterior Tibial pulse  0/4 bilateral. Capillary fill time <5 sec 1-5 bilateral. No hair growth to the level of the digits.Temperature gradient within normal limits. No varicosities present bilateral. Chronic edema present bilateral.   Neurology: The patient has diminished  sensation measured with a 5.07/10g Semmes Weinstein Monofilament at all pedal sites bilateral . Vibratory sensation diminished bilateral with tuning fork. No Babinski sign present bilateral.   Musculoskeletal: Asymptomatic pes planus pedal deformities noted bilateral. Muscular strength 4/5 in all lower extremity muscular groups bilateral without pain on range of motion. No tenderness with calf compression bilateral.  Assessment and Plan: Problem List Items Addressed This Visit    None    Visit Diagnoses    Dermatophytosis of nail    -  Primary   Diabetic polyneuropathy associated with type 2 diabetes mellitus (HCC)       Toe pain, bilateral       Lymphedema       Pes planus of both feet       Callus of heel         -Examined patient. -Discussed and educated patient on diabetic foot care, especially with  regards to the vascular, neurological and musculoskeletal systems.  -Stressed the importance of good glycemic control and the detriment of not  controlling glucose levels in relation to the foot. -Mechanically debrided all nails 1-5 bilateral using sterile nail nipper and filed with dremel without incident  -Dry callus to heels; recommend skin emollients  -Safe step diabetic shoe order form was completed; office to contact primary care for approval / certification;  Office to arrange shoe fitting and dispensing. -Answered all patient questions -Patient to return  in 3 months for at risk foot care -Patient advised to call the office if any problems or questions arise in the meantime.  Landis Martins, DPM

## 2016-01-28 ENCOUNTER — Telehealth: Payer: Self-pay | Admitting: Internal Medicine

## 2016-01-28 MED ORDER — DOXYCYCLINE HYCLATE 100 MG PO TABS: 100.0000 mg | ORAL_TABLET | Freq: Two times a day (BID) | ORAL | 0 refills | Status: DC

## 2016-01-28 NOTE — Telephone Encounter (Signed)
States this is the first time she has seen patient.  States patients wound has a strong odor.  Would like to know if patient needs antibiotic.

## 2016-01-28 NOTE — Telephone Encounter (Signed)
Spoke with pt to inform RX was sent to pharmacy for wound.

## 2016-01-28 NOTE — Telephone Encounter (Signed)
We will start antibiotics again.  Doxycycline sent to pharmacy.

## 2016-01-28 NOTE — Telephone Encounter (Signed)
Please advise 

## 2016-01-29 ENCOUNTER — Other Ambulatory Visit: Payer: Self-pay | Admitting: *Deleted

## 2016-01-29 ENCOUNTER — Telehealth: Payer: Self-pay | Admitting: Neurology

## 2016-01-29 ENCOUNTER — Other Ambulatory Visit: Payer: Self-pay | Admitting: Obstetrics & Gynecology

## 2016-01-29 DIAGNOSIS — E1142 Type 2 diabetes mellitus with diabetic polyneuropathy: Secondary | ICD-10-CM

## 2016-01-29 DIAGNOSIS — Z1231 Encounter for screening mammogram for malignant neoplasm of breast: Secondary | ICD-10-CM

## 2016-01-29 NOTE — Telephone Encounter (Signed)
Per Dr. [name] - offer NCV/EMG for further evaluation.  Pt agreeable and orders have been placed in Epic.  Pt aware to expect our call for scheduling.

## 2016-01-29 NOTE — Telephone Encounter (Signed)
Pt called said the gabapentin (NEURONTIN) 300 MG capsule is not helping. Pt said she stopped taking medication and then started taking it again about 3-4 mths but it is not helping with the neuropathy pain. Is there another medication she could try.

## 2016-01-31 ENCOUNTER — Other Ambulatory Visit: Payer: Self-pay | Admitting: Neurology

## 2016-01-31 ENCOUNTER — Telehealth: Payer: Self-pay | Admitting: Diagnostic Neuroimaging

## 2016-01-31 NOTE — Telephone Encounter (Signed)
Patient called for increasing neuropathy pain. Patient is on gabapentin 300 mg 3 times a day. I advised her to increase dose up to 600 mg 3 times a day. -VRP

## 2016-02-05 ENCOUNTER — Encounter: Payer: Self-pay | Admitting: *Deleted

## 2016-02-05 ENCOUNTER — Ambulatory Visit: Payer: Medicare Other

## 2016-02-05 ENCOUNTER — Telehealth: Payer: Self-pay | Admitting: Neurology

## 2016-02-05 NOTE — Telephone Encounter (Signed)
Spoke to patient - she has been taking gabapentin 600mg , TID for the last 5 days.  Reports no improvement in symptoms with this increase in medication.  She is inquiring about trying Lyrica.  She is aware that Dr. [name] will address this concern upon her return on 02/06/16.

## 2016-02-05 NOTE — Telephone Encounter (Signed)
Left message for a return call

## 2016-02-05 NOTE — Telephone Encounter (Signed)
Pt called said she talked to a friend/NP and was told Lyrica would help with neuropathy. She would like to start it. Pt said she increased gabapentin as directed by Dr Mamie Nick She can be reached at [phone]

## 2016-02-05 NOTE — Telephone Encounter (Signed)
error 

## 2016-02-06 ENCOUNTER — Encounter: Payer: Self-pay | Admitting: *Deleted

## 2016-02-06 MED ORDER — PREGABALIN 50 MG PO CAPS: 50.0000 mg | ORAL_CAPSULE | Freq: Three times a day (TID) | ORAL | 5 refills | Status: DC

## 2016-02-06 NOTE — Telephone Encounter (Signed)
Returned call to patient - she is agreeable to Lyrica to replace gabapentin.  Rx faxed to her pharmacy [facility] at [phone]).

## 2016-02-06 NOTE — Addendum Note (Signed)
Addended by: Marcial Pacas on: 02/06/2016 09:48 AM   Modules accepted: Orders

## 2016-02-06 NOTE — Telephone Encounter (Signed)
Please let patient know that I have Rx Lyrica 50mg  tid, x90 with 5 refills.

## 2016-02-24 ENCOUNTER — Telehealth: Payer: Self-pay | Admitting: Emergency Medicine

## 2016-02-24 DIAGNOSIS — R269 Unspecified abnormalities of gait and mobility: Secondary | ICD-10-CM

## 2016-02-24 DIAGNOSIS — R600 Localized edema: Secondary | ICD-10-CM

## 2016-02-24 DIAGNOSIS — Z6841 Body Mass Index (BMI) 40.0 and over, adult: Secondary | ICD-10-CM

## 2016-02-24 NOTE — Telephone Encounter (Signed)
Pt called and wanted to know if she can have an order put in for Home Health PT. She asked that you give her a call back about this. Please advise thanks.

## 2016-02-24 NOTE — Telephone Encounter (Signed)
Please advise 

## 2016-02-25 NOTE — Addendum Note (Signed)
Addended by: [name] on: 02/25/2016 09:28 PM   Modules accepted: Orders

## 2016-02-25 NOTE — Telephone Encounter (Signed)
Order pending - what company does she use?

## 2016-02-26 ENCOUNTER — Telehealth: Payer: Self-pay | Admitting: Sports Medicine

## 2016-02-26 NOTE — Telephone Encounter (Signed)
Pt would like to have PT through [facility], she is currently using them for other services.

## 2016-02-26 NOTE — Telephone Encounter (Signed)
Order placed

## 2016-02-26 NOTE — Addendum Note (Signed)
Addended by: [name] on: 02/26/2016 12:23 PM   Modules accepted: Orders

## 2016-02-26 NOTE — Telephone Encounter (Signed)
Pt calling to see if we got the paperwork back from pcp for her diabetic shoes.

## 2016-03-02 ENCOUNTER — Telehealth: Payer: Self-pay | Admitting: Internal Medicine

## 2016-03-02 NOTE — Telephone Encounter (Signed)
Skilled nursing to extend services for twice a week for eight weeks. Add PT  [place] vs. OT? Patient has new wound.  Wants Dr. Quay Burow to look at on next appt.

## 2016-03-02 NOTE — Telephone Encounter (Signed)
Spoke with Nurse manager to give orders per MD, unable to reach amber on number given

## 2016-03-03 ENCOUNTER — Ambulatory Visit: Payer: Medicare Other | Admitting: Internal Medicine

## 2016-03-04 ENCOUNTER — Ambulatory Visit (INDEPENDENT_AMBULATORY_CARE_PROVIDER_SITE_OTHER): Payer: Medicare Other | Admitting: Internal Medicine

## 2016-03-04 ENCOUNTER — Encounter: Payer: Self-pay | Admitting: Internal Medicine

## 2016-03-04 VITALS — BP 166/92 | HR 77 | Temp 98.3°F | Resp 16 | Wt 368.0 lb

## 2016-03-04 DIAGNOSIS — N183 Chronic kidney disease, stage 3 unspecified: Secondary | ICD-10-CM

## 2016-03-04 DIAGNOSIS — S80821A Blister (nonthermal), right lower leg, initial encounter: Secondary | ICD-10-CM

## 2016-03-04 DIAGNOSIS — L089 Local infection of the skin and subcutaneous tissue, unspecified: Secondary | ICD-10-CM

## 2016-03-04 DIAGNOSIS — G609 Hereditary and idiopathic neuropathy, unspecified: Secondary | ICD-10-CM

## 2016-03-04 DIAGNOSIS — I10 Essential (primary) hypertension: Secondary | ICD-10-CM | POA: Diagnosis not present

## 2016-03-04 DIAGNOSIS — R600 Localized edema: Secondary | ICD-10-CM | POA: Diagnosis not present

## 2016-03-04 MED ORDER — DOXYCYCLINE HYCLATE 100 MG PO TABS: 100.0000 mg | ORAL_TABLET | Freq: Two times a day (BID) | ORAL | 0 refills | Status: DC

## 2016-03-04 MED ORDER — PREGABALIN 100 MG PO CAPS: 100.0000 mg | ORAL_CAPSULE | Freq: Three times a day (TID) | ORAL | 5 refills | Status: DC

## 2016-03-04 MED ORDER — FUROSEMIDE 40 MG PO TABS: 80.0000 mg | ORAL_TABLET | Freq: Two times a day (BID) | ORAL | 5 refills | Status: DC

## 2016-03-04 NOTE — Patient Instructions (Signed)
   Medications reviewed and updated.  Changes include starting doxycyline for your infected leg wound.  We will also increase your Lyrica to 100 mg three times a day.   Your prescription(s) have been submitted to your pharmacy. Please take as directed and contact our office if you believe you are having problem(s) with the medication(s).    Please followup in 3 months

## 2016-03-04 NOTE — Assessment & Plan Note (Signed)
BP elevated here - she has home care nurse and BP is well controlled at home No change in medications today

## 2016-03-04 NOTE — Assessment & Plan Note (Signed)
Another blister/ulcer infection on right leg - foul smell Start doxycycline x 14 days Has home wound care Will be establishing with wound care

## 2016-03-04 NOTE — Progress Notes (Signed)
Subjective:    Patient ID: April [name], female    DOB: 1951-01-14, 66 y.o.   MRN: [id number]  HPI The patient is here for follow up.  CKD:  She saw [facility].  Her lasix was increased to 80 mg BID.  She has lost weight.  She still has significant leg edema.  She was referred to wound care for lymphedema wraps.  Lymphedema LE b/l: She will be going to the wound center at one point for lymphedema wraps.  She has home care coming twice a week for wound care. She has not seen much difference in her swelling.    Leg ulcers:  She has two on the right lateral leg and one on the posterior left leg.  The right leg ulcer smells and she is concerned about an infection. The ulcers and legs in general are very painful.    Neuropathy:  Her pain level is at a 9-9.5/10.  Her pain is from the neuropathy and pain from the ulcers.  She taking taking her lyrica daily.  The medication does make her a little drowsy, but only temporarily.      Medications and allergies reviewed with patient and updated if appropriate.  Patient Active Problem List   Diagnosis Date Noted  . Osteopenia 01/11/2016  . Chronic kidney disease 01/02/2016  . Infected blister of right leg 01/02/2016  . Hand paresthesia 07/18/2015  . Carpal tunnel syndrome 06/07/2015  . Family history of colon cancer   . Diabetes mellitus with neurological manifestations ([place]) 04/18/2015  . Bilateral leg edema 04/11/2015  . Cellulitis of leg, right 04/11/2015  . Abnormality of gait 01/03/2015  . Hereditary and idiopathic peripheral neuropathy 01/03/2015  . GERD (gastroesophageal reflux disease) 06/17/2014  . Morbid obesity with BMI of 50.0-59.9, adult ([place]) 09/05/2013  . Hx of colonic polyps 12/15/2012  . Vitamin B12 deficiency 11/03/2012  . Intrinsic asthma 03/23/2012  . Chronic diastolic heart failure ([place]) 02/20/2011  . OSA (obstructive sleep apnea) 09/17/2010  . URINARY URGENCY 01/08/2010  . CAD, NATIVE VESSEL  11/20/2008  . OSTEOARTHRITIS, KNEES, BILATERAL, SEVERE 06/14/2008  . Hyperlipidemia 05/10/2007  . Essential hypertension 01/18/2007  . HYPOKALEMIA 04/30/2006  . HX, PERSONAL, PEPTIC ULCER DISEASE 04/30/2006    Current Outpatient Prescriptions on File Prior to Visit  Medication Sig Dispense Refill  . aspirin (ECOTRIN LOW STRENGTH) 81 MG EC tablet Take 81 mg by mouth at bedtime.     . budesonide-formoterol (SYMBICORT) 160-4.5 MCG/ACT inhaler one - two inhalations every 12 hours; gargle and spit after use (Patient taking differently: Inhale 2 puffs into the lungs 2 (two) times daily as needed (shortness of breath). gargle and spit after use) 1 Inhaler 4  . carvedilol (COREG) 25 MG tablet Take 1 tablet (25 mg total) by mouth 2 (two) times daily with a meal. 60 tablet 3  . Cod Liver Oil CAPS Take 1 capsule by mouth daily.     . cyanocobalamin (,VITAMIN B-12,) 1000 MCG/ML injection Inject 1 mL (1,000 mcg total) into the muscle every 30 (thirty) days. 2 mL 1  . docusate sodium (COLACE) 100 MG capsule Take 2-3 capsules (200-300 mg total) by mouth daily. 90 capsule 11  . DULoxetine (CYMBALTA) 60 MG capsule Take 1 capsule (60 mg total) by mouth daily. 30 capsule 12  . Flaxseed, Linseed, (FLAX SEED OIL) 1000 MG CAPS Take 1,000 mg by mouth 2 (two) times daily.     . fluticasone (FLONASE) 50 MCG/ACT nasal spray Place  2 sprays into both nostrils daily. 16 g 0  . folic acid (FOLVITE) 326 MCG tablet Take 800 mcg by mouth at bedtime.     Marland Kitchen glucose blood (ONE TOUCH ULTRA TEST) test strip Use to check blood sugars twice a day Dx E11.9 100 each 3  . hydrALAZINE (APRESOLINE) 50 MG tablet take 1 tablet by mouth every 8 hours 270 tablet 1  . Lancets (ONETOUCH ULTRASOFT) lancets Use to help check blood sugars twice a day Dx E11.9 100 each 3  . Multiple Vitamin (MULTIVITAMIN) tablet Take 1 tablet by mouth daily.      Marland Kitchen NEEDLE, DISP, 23 G (EASY TOUCH FLIPLOCK NEEDLES) 23G X 1-1/2" MISC Use to inject B12 once monthly.  2 each 0  . Omega-3 Fatty Acids (FISH OIL) 1000 MG CAPS Take 1,000 mg by mouth daily.     Marland Kitchen omeprazole (PRILOSEC) 40 MG capsule Take 1 capsule (40 mg total) by mouth 2 (two) times daily. (Patient taking differently: Take 40 mg by mouth 2 (two) times daily as needed (acid reflux). ) 60 capsule 6  . potassium chloride SA (K-DUR,KLOR-CON) 20 MEQ tablet take 1 tablet by mouth once daily 90 tablet 3  . RA COL-RITE 100 MG capsule take 1 capsule by mouth twice a day if needed for MILD CONSTIPATION 60 capsule 0  . simvastatin (ZOCOR) 40 MG tablet Take 1 tablet (40 mg total) by mouth at bedtime. 90 tablet 3  . spironolactone (ALDACTONE) 25 MG tablet Take 1 tablet (25 mg total) by mouth daily. 30 tablet 5  . SYRINGE-NEEDLE, DISP, 3 ML 23G X 1" 3 ML MISC Use with B12 to inject IM every 30 days 50 each 0  . topiramate (TOPAMAX) 100 MG tablet Take 1 tablet (100 mg total) by mouth 2 (two) times daily. 60 tablet 11  . triamcinolone cream (KENALOG) 0.1 %   0  . verapamil (VERELAN PM) 120 MG 24 hr capsule take 1 capsule by mouth once daily 90 capsule 3  . albuterol (PROVENTIL HFA) 108 (90 BASE) MCG/ACT inhaler Inhale 2 puffs into the lungs every 4 (four) hours as needed for wheezing or shortness of breath. 1 Inhaler 4   No current facility-administered medications on file prior to visit.     Past Medical History:  Diagnosis Date  . Anemia   . Asthma   . CAD (coronary artery disease)   . Carpal tunnel syndrome   . Cellulitis of both lower extremities 04/11/2015  . CHF (congestive heart failure) ([place])   . Colon polyp, hyperplastic 2007 & 2012  . Complication of anesthesia 1999   svt with renal calculi surgery, no problems since  . CTS (carpal tunnel syndrome)   . Diabetes mellitus   . Eczema   . Hyperlipidemia   . Hypertension   . Leg ulcer ([place]) 04/24/2015   Right lateral leg No evidence of an infection Monitor closely Keep edema controlled   . Meralgia paresthetica    Dr. [name]  . Morbid obesity ([place])    . Morbid obesity ([place])   . Neuropathy ([facility])    toes and legs  . Osteoarthrosis, unspecified whether generalized or localized, lower leg   . PUD (peptic ulcer disease)   . Shortness of breath dyspnea    with exertion  . Type II or unspecified type diabetes mellitus without mention of complication, not stated as uncontrolled   . Unspecified hereditary and idiopathic peripheral neuropathy   . Vitamin B12 deficiency   . Wound cellulitis  right upper leg, healing well    Past Surgical History:  Procedure Laterality Date  . ABDOMINAL HYSTERECTOMY    . CARDIAC CATHETERIZATION  2002   non obstructive disease  . colonoscopy with polypectomy  2007 & 2012    hyperplastic ;Dr Watt Climes  . COLONOSCOPY WITH PROPOFOL N/A 06/04/2015   Procedure: COLONOSCOPY WITH PROPOFOL;  Surgeon: Jerene Bears, MD;  Location: [facility];  Service: Gastroenterology;  Laterality: N/A;  . DILATION AND CURETTAGE OF UTERUS     multiple  . HEMORRHOID SURGERY    . renal calculi  12/1997   SVT with induction of anesthesia  . right knee arthroscopy    . TONSILLECTOMY AND ADENOIDECTOMY      Social History   Social History  . Marital status: Married    Spouse name: [name]  . Number of children: 1  . Years of education: BS   Occupational History  . Disabled Retired   Social History Main Topics  . Smoking status: Never Smoker  . Smokeless tobacco: Never Used  . Alcohol use No  . Drug use: No  . Sexual activity: Not on file   Other Topics Concern  . Not on file   Social History Narrative   Patient lives at home with her husband [name] Patient is retired and has a Conservation officer, nature.    Caffeine - some times.   Right handed.    Family History  Problem Relation Age of Onset  . Colon cancer Mother   . Prostate cancer Father   . Colon cancer Father   . Diabetes Maternal Aunt   . Diabetes Maternal Uncle   . Diabetes Paternal Aunt   . Stroke Paternal Aunt     > 65  . Heart disease Paternal  Aunt   . Diabetes Paternal Uncle   . Breast cancer Maternal Aunt      X 2    Review of Systems  Constitutional: Negative for fever (unknown).  Respiratory: Positive for cough, shortness of breath and wheezing.   Cardiovascular: Positive for leg swelling. Negative for chest pain and palpitations.  Gastrointestinal: Negative for abdominal pain.  Neurological: Negative for light-headedness and headaches.       Objective:   Vitals:   03/04/16 1341  BP: (!) 166/92  Pulse: 77  Resp: 16  Temp: 98.3 F (36.8 C)   Wt Readings from Last 3 Encounters:  03/04/16 (!) 368 lb (166.9 kg)  01/17/16 (!) 374 lb (169.6 kg)  12/19/15 (!) 364 lb (165.1 kg)   Body mass index is 61.24 kg/m.   Physical Exam    Constitutional: Appears well-developed and well-nourished. No distress.  HENT:  Head: Normocephalic and atraumatic.  Neck: Neck supple. No tracheal deviation present. No thyromegaly present.  No cervical lymphadenopathy Cardiovascular: Normal rate, regular rhythm and normal heart sounds.   No murmur heard. No carotid bruit .  3 + b/l LE edema and lymphedema - appears slightly better than prior visit Pulmonary/Chest: Effort normal and breath sounds normal. No respiratory distress. No has no wheezes. No rales.  Skin: two ulcers lateral aspect of right leg - one with foul smell, no active discharge, but both with crust that appears to be dried discharge. Slightly warm, entire right leg is tender; left leg ulcer with granulation tissue,  - no active discharge, no other left leg ulcers.    Psychiatric: Normal mood and affect. Behavior is normal.      Assessment & Plan:    See  Problem List for Assessment and Plan of chronic medical problems.   FU in 3 months

## 2016-03-04 NOTE — Assessment & Plan Note (Signed)
Taking lyrica 50 mg TID Pain not controlled Increase lyrica to 100 mg TID

## 2016-03-04 NOTE — Assessment & Plan Note (Signed)
Established with [facility] Lasix increased - some dec in Leg edema

## 2016-03-04 NOTE — Assessment & Plan Note (Signed)
Edema/lymphedema Wound center referral by nephrology for lymphedema wraps Lasix 80 mg BID

## 2016-03-04 NOTE — Progress Notes (Signed)
Pre visit review using our clinic review tool, if applicable. No additional management support is needed unless otherwise documented below in the visit note. 

## 2016-03-05 ENCOUNTER — Telehealth: Payer: Self-pay | Admitting: Internal Medicine

## 2016-03-05 NOTE — Telephone Encounter (Signed)
Verbal needed for:  4 weeks, 2 times a week OT Also needs a bariatric toilet chair  Please call Will

## 2016-03-05 NOTE — Telephone Encounter (Signed)
ok 

## 2016-03-06 ENCOUNTER — Ambulatory Visit: Payer: Medicare Other

## 2016-03-06 NOTE — Telephone Encounter (Signed)
LVM with Will giving verbal orders.

## 2016-03-10 ENCOUNTER — Ambulatory Visit: Payer: Medicare Other | Admitting: Internal Medicine

## 2016-03-16 ENCOUNTER — Telehealth: Payer: Self-pay | Admitting: Emergency Medicine

## 2016-03-16 NOTE — Telephone Encounter (Signed)
Yes - what do we need to do?  Didn't we give a prescription for this already?

## 2016-03-16 NOTE — Telephone Encounter (Signed)
Pt called and wants to know if she can get an order for a heavy duty bedside commode. Please advise thanks.

## 2016-03-17 ENCOUNTER — Ambulatory Visit
Admission: RE | Admit: 2016-03-17 | Discharge: 2016-03-17 | Disposition: A | Discharge status: Home / Self Care | Payer: Medicare Other | Source: Ambulatory Visit | Attending: Obstetrics & Gynecology | Admitting: Obstetrics & Gynecology

## 2016-03-17 DIAGNOSIS — Z1231 Encounter for screening mammogram for malignant neoplasm of breast: Secondary | ICD-10-CM

## 2016-03-17 NOTE — Telephone Encounter (Signed)
Spoke with pt, RX has been faxed to [place]

## 2016-03-20 ENCOUNTER — Ambulatory Visit (INDEPENDENT_AMBULATORY_CARE_PROVIDER_SITE_OTHER): Payer: Medicare Other | Admitting: Neurology

## 2016-03-20 DIAGNOSIS — G5603 Carpal tunnel syndrome, bilateral upper limbs: Secondary | ICD-10-CM

## 2016-03-20 DIAGNOSIS — G629 Polyneuropathy, unspecified: Secondary | ICD-10-CM | POA: Insufficient documentation

## 2016-03-20 DIAGNOSIS — R52 Pain, unspecified: Secondary | ICD-10-CM | POA: Insufficient documentation

## 2016-03-20 DIAGNOSIS — E1142 Type 2 diabetes mellitus with diabetic polyneuropathy: Secondary | ICD-10-CM | POA: Insufficient documentation

## 2016-03-20 MED ORDER — LIDOCAINE 2 % EX GEL: 4.0000 g | CUTANEOUS | 11 refills | Status: DC | PRN

## 2016-03-20 MED ORDER — DICLOFENAC SODIUM 1 % TD GEL
2.0000 g | Freq: Four times a day (QID) | TRANSDERMAL | 11 refills | Status: DC
Start: 2016-03-20 — End: 2021-03-06

## 2016-03-20 NOTE — Procedures (Signed)
Full Name: April [name] Gender: Female MRN #: [id number] Date of Birth: 1950-04-28    Visit Date: 03/20/2016 12:20 Age: 66 Years 80 Months Old Examining Physician: Marcial Pacas, MD  Referring Physician: [name] History: 66 year old female, mobile obesity, complains of bilateral feet paresthesia diffuse body achy pain  Summary of test: Nerve conduction study: Bilateral sural, superficial peroneal, left median, bilateral ulnar sensory responses were absent. Right median sensory response showed mildly decreased snap amplitude, with mild to moderately prolonged peak latency.  Bilateral ulnar motor responses were normal. Left median motor responses showed moderately prolonged distal latency, with moderately decreased C map amplitude. Right median motor responses were within normal limits. Left peroneal motor responses showed severely decreased C map amplitude at proximal stimulation side, this is also complicated by her thick skin and edema. Left tibial motor responses showed normal C map amplitude, distal latency.   Electromyography:  Selective needle examination was performed at left lower extremity muscles, left upper extremity muscles.   There was no significant abnormality noticed.   Conclusion: This is an abnormal yet technically difficult study due to her morbid obesity. There is evidence of probable mild axonal peripheral neuropathy, There is also electrodiagnostic evidence of bilateral median neuropathy across the wrist, left-sided severe, right side is mild.   ------------------------------- Marcial Pacas, M.D.  [facility] [place], [place] [address] Tel: [phone] Fax: [phone]        [facility]    Nerve / Sites Rec. Site Peak Lat Ref. Amp.1-2 Ref. Distance    ms ms V V cm  L Sural - Ankle (Calf)     Calf Ankle NR ?4.40 NR ?6.0 14  L Superficial peroneal - Ankle     Lat leg Ankle NR ?4.40 NR ?6.0 14  L Median - Orthodromic (Dig II, Mid  palm)     Dig II Wrist NR ?3.40 NR ?10.0 13  R Median - Orthodromic (Dig II, Mid palm)     Dig II Wrist 3.91 ?3.40 3.5 ?10.0 13  L Ulnar - Orthodromic, (Dig V, Mid palm)     Dig V Wrist NR ?3.10 NR ?5.0 11  R Ulnar - Orthodromic, (Dig V, Mid palm)     Dig V Wrist NR ?3.10 NR ?5.0 11     [facility]    Nerve / Sites Muscle Latency Ref. Amplitude Ref. Rel Amp Segments Distance Lat Diff Velocity Ref. Area    ms ms mV mV %  cm ms m/s m/s mVms  L Median - APB     Wrist APB 5.3 ?4.4 1.8 ?4.0 100 Wrist - APB 7    4.6     Upper arm APB 9.3  1.6  93 Upper arm - Wrist 18 4.0 45  5.3  R Median - APB     Wrist APB 5.0 ?4.4 4.1 ?4.0 100 Wrist - APB 7    14.8     Upper arm APB 9.3  3.9  94.4 Upper arm - Wrist 20 4.3 47  13.7  L Ulnar - ADM     Wrist ADM 3.3 ?3.3 6.9 ?6.0 100 Wrist - ADM 7    22.3     B.Elbow ADM 7.2  4.1  60.4 B.Elbow - Wrist 18 4.0 45 ?49 15.9     A.Elbow ADM 9.8  3.4  82.9 A.Elbow - B.Elbow 10 2.6 38 ?49 13.2         A.Elbow - Wrist  6.6  R Ulnar - ADM     Wrist ADM 3.1 ?3.3 8.4 ?6.0 100 Wrist - ADM 7    27.5     B.Elbow ADM 7.2  3.7  44.7 B.Elbow - Wrist 18 4.1 44 ?49 13.2     A.Elbow ADM 10.6  3.5  94.4 A.Elbow - B.Elbow 10 3.4 30 ?49 12.1         A.Elbow - Wrist  7.4     L Peroneal - EDB     Ankle EDB 4.7 ?6.5 0.5 ?2.0 100 Ankle - EDB 5    1.6     Fib head EDB NR  NR  NR Fib head - Ankle  NR  ?44 NR         Pop fossa - Ankle       L Tibial - AH     Ankle AH 5.5 ?5.8 2.9 ?4.0 100 Ankle - AH 9    6.6     Pop fossa AH      Pop fossa - Ankle    ?37      F  Wave    Nerve F Lat Ref.   ms ms  L Ulnar - ADM 35.2 ?32.0  R Ulnar - ADM 36.7 ?32.0     EMG full       EMG Summary Table    Spontaneous MUAP Recruitment  Muscle IA Fib PSW Fasc Other Amp Dur. Poly Pattern  L. Tibialis anterior Normal None None None _______ Normal Normal Normal Normal  L. Vastus lateralis Normal None None None _______ Normal Normal Normal Normal  L. First dorsal interosseous Normal None None None  _______ Normal Normal Normal Normal  L. Biceps brachii Normal None None None _______ Normal Normal Normal Normal  L. Deltoid Normal None None None _______ Normal Normal Normal Normal

## 2016-03-20 NOTE — Progress Notes (Signed)
No chief complaint on file.    [facility]  PATIENT: April [name] DOB: [birth year]  HISTORY OF PRESENT ILLNESS:HISTORY ( Initial evaluation 10/25/2012): evaluation of bilateral muscle cramping.  She had past medical history of obesity, diabetes, hyperlipidemia, hypertension, presenting with bilateral feet paresthesia muscle cramping. saw her previously for diabetic peripheral neuropathy, she complained of bilateral knee pain, bilateral feet paresthesia, difficulty bearing weight, gait difficulty, EMG nerve conduction study in January 2013 confirmed the diagnosis of axonal peripheral neuropathy,   She has been taking Topamax 100 mg twice a day for bilateral feet paresthesia, previous laboratory evaluation demonstrated normal sodium, potassium, magnesium, CPK of 92.  She continued to suffer severe morbid obesity,  low back pain, gait difficulty, bilateral knee pain, recent in one year, she also began to suffer bilateral feet muscle cramping, intermittent, severe, lasting 7-8 minutes, happen about once a week. It often involves her feet muscles, also involves her thigh muscles.  She continued to have significant obesity, gait difficulty.  Lab showed low B12 183 October 2014, received B12 shot, now monthly, repeat B12 level January 2015 was normal 622  She came in with wheelchair, last visit was with Hoyle Sauer in January 2015, she recently developed acid reflux, now on prilosec, mild difficulty swallowing. She still has feet swelling, bilateral feet numbness, burning sensation, frequent bilateral feet, and leg muscle crampiness, couple times a week, woke her up from sleep, very painful, she is taking Topamax 100 mg twice a day, which seems to help her some,  She has become more sedentary cause of her weight, knee pain, sitting in wheelchair most of the time.  She received home physical therapy,now using hospital bed and gel overlay, lower air loss mattress, new walker, overhead  trapezius bar, because of her limited mobility,obesity, orthostatic dizziness, multiple joints pain, especially right knee pain, bilateral feet paresthesia, needle prick pain. She also developed bilateral posterior thigh area skin abrasion, pressor ulcers.  UPDATE June 15th 2017: Last visit was with Hoyle Sauer in December 2016, she is accompanied by her father at today's clinical visit, she was admitted to the hospital in March 11 through 14 2017 for bilateral lower extremity cellulitis  I reviewed hospital records, she presented with bilateral lower extremity swelling and pain, blisters on her right leg, echocardiogram showed grade 1 diastolic dysfunction, there was no evidence of right cardiac failure, ejection fraction 65-70%, no evidence of pulmonary edema, she was aggressively diuresed with Lasix, was negative balance of 4700 cc, was also treated with 5 days of antibiotics, her symptoms has much improved,  I have reviewed laboratory evaluation, A1c was normal 5.4, B12 was within normal limits, normal CBC,with exception of mild anemia hemoglobin of 10 point 9, BMP showed mild elevated glucose 105, creatinine 1.2  She is taking gabapentin 300 mg 3 times a day and also Topamax 100 mg twice a day for her neuropathic pain involving bilateral lower extremity, she has a history of kidney stone, I have advised her stop Topamax, add on Cymbalta 60 mg daily, long-term, continues can be associated with weight gain, if she is not sure about the benefit from the medication, she should stop taking gabapentin use as well  Today she also complains of worsening bilateral hands paresthesia, involving fourth and fifth fingers, and also first 3 fingers, worse on the right side, mild weakness, she denies neck pain  Update March 20 2016: Last clinical visit was in June 2017, she returned for electrodiagnostic study today, which showed probable mild axonal  peripheral neuropathy, bilateral carpal tunnel syndromes,  left-sided severe, right side is mild,  She continued to combat obesity, diffuse body achy pain, most bothersome symptoms is at the bottom of her feet, she had a severe needle prick, shooting pain, difficulty bearing weight,  She was taking gabapentin, was switched to Lyrica on titrating dose 100 mg 3 times a day with limited response, she also complains of side effect of excessive drowsiness with Lyrica.  I reviewed laboratory evaluation in December 2017 mild elevated creatinine 1.2, normal TSH, CBC, A1c 5.5,  REVIEW OF SYSTEMS: Full 14 system review of systems performed and notable only for those listed, all others are neg: Chill, fatigue, cough, wheezing, shortness of breath, joint pain, muscle cramps, walking difficulty, numbness  ALLERGIES: Allergies  Allergen Reactions  . Penicillins Rash    She was told not to take it anymore. Has patient had a PCN reaction causing immediate rash, facial/tongue/throat swelling, SOB or lightheadedness with hypotension: Yes Has patient had a PCN reaction causing severe rash involving mucus membranes or skin necrosis: No Has patient had a PCN reaction that required hospitalization: No Has patient had a PCN reaction occurring within the last 10 years: No If all of the above answers are "NO", then may proceed with Cephalosporin use.'  . Sulfonamide Derivatives Anaphylaxis    REACTION: internal "burning"  . Benazepril Hcl Other (See Comments)    Patient unsure of reaction.  . Hydrochlorothiazide W-Triamterene Other (See Comments)    Hypokalemia  . Metronidazole Other (See Comments)    Patient unsure of reaction.  Marland Kitchen Spironolactone     "kidney problems"  . Torsemide Other (See Comments)    Patient unsure of reaction.  . Valsartan Other (See Comments)    Patient unsure of reaction.    HOME MEDICATIONS: Outpatient Medications Prior to Visit  Medication Sig Dispense Refill  . albuterol (PROVENTIL HFA) 108 (90 BASE) MCG/ACT inhaler Inhale 2 puffs  into the lungs every 4 (four) hours as needed for wheezing or shortness of breath. 1 Inhaler 4  . aspirin (ECOTRIN LOW STRENGTH) 81 MG EC tablet Take 81 mg by mouth at bedtime.     . budesonide-formoterol (SYMBICORT) 160-4.5 MCG/ACT inhaler one - two inhalations every 12 hours; gargle and spit after use (Patient taking differently: Inhale 2 puffs into the lungs 2 (two) times daily as needed (shortness of breath). gargle and spit after use) 1 Inhaler 4  . carvedilol (COREG) 25 MG tablet Take 1 tablet (25 mg total) by mouth 2 (two) times daily with a meal. 60 tablet 3  . Cod Liver Oil CAPS Take 1 capsule by mouth daily.     . cyanocobalamin (,VITAMIN B-12,) 1000 MCG/ML injection Inject 1 mL (1,000 mcg total) into the muscle every 30 (thirty) days. 2 mL 1  . docusate sodium (COLACE) 100 MG capsule Take 2-3 capsules (200-300 mg total) by mouth daily. 90 capsule 11  . doxycycline (VIBRA-TABS) 100 MG tablet Take 1 tablet (100 mg total) by mouth 2 (two) times daily. 28 tablet 0  . DULoxetine (CYMBALTA) 60 MG capsule Take 1 capsule (60 mg total) by mouth daily. 30 capsule 12  . Flaxseed, Linseed, (FLAX SEED OIL) 1000 MG CAPS Take 1,000 mg by mouth 2 (two) times daily.     . fluticasone (FLONASE) 50 MCG/ACT nasal spray Place 2 sprays into both nostrils daily. 16 g 0  . folic acid (FOLVITE) 678 MCG tablet Take 800 mcg by mouth at bedtime.     Marland Kitchen  furosemide (LASIX) 40 MG tablet Take 2 tablets (80 mg total) by mouth 2 (two) times daily. 360 tablet 5  . glucose blood (ONE TOUCH ULTRA TEST) test strip Use to check blood sugars twice a day Dx E11.9 100 each 3  . hydrALAZINE (APRESOLINE) 50 MG tablet take 1 tablet by mouth every 8 hours 270 tablet 1  . Lancets (ONETOUCH ULTRASOFT) lancets Use to help check blood sugars twice a day Dx E11.9 100 each 3  . Multiple Vitamin (MULTIVITAMIN) tablet Take 1 tablet by mouth daily.      Marland Kitchen NEEDLE, DISP, 23 G (EASY TOUCH FLIPLOCK NEEDLES) 23G X 1-1/2" MISC Use to inject B12  once monthly. 2 each 0  . Omega-3 Fatty Acids (FISH OIL) 1000 MG CAPS Take 1,000 mg by mouth daily.     Marland Kitchen omeprazole (PRILOSEC) 40 MG capsule Take 1 capsule (40 mg total) by mouth 2 (two) times daily. (Patient taking differently: Take 40 mg by mouth 2 (two) times daily as needed (acid reflux). ) 60 capsule 6  . potassium chloride SA (K-DUR,KLOR-CON) 20 MEQ tablet take 1 tablet by mouth once daily 90 tablet 3  . pregabalin (LYRICA) 100 MG capsule Take 1 capsule (100 mg total) by mouth 3 (three) times daily. 90 capsule 5  . RA COL-RITE 100 MG capsule take 1 capsule by mouth twice a day if needed for MILD CONSTIPATION 60 capsule 0  . simvastatin (ZOCOR) 40 MG tablet Take 1 tablet (40 mg total) by mouth at bedtime. 90 tablet 3  . spironolactone (ALDACTONE) 25 MG tablet Take 1 tablet (25 mg total) by mouth daily. 30 tablet 5  . SYRINGE-NEEDLE, DISP, 3 ML 23G X 1" 3 ML MISC Use with B12 to inject IM every 30 days 50 each 0  . topiramate (TOPAMAX) 100 MG tablet Take 1 tablet (100 mg total) by mouth 2 (two) times daily. 60 tablet 11  . triamcinolone cream (KENALOG) 0.1 %   0  . verapamil (VERELAN PM) 120 MG 24 hr capsule take 1 capsule by mouth once daily 90 capsule 3   No facility-administered medications prior to visit.     PAST MEDICAL HISTORY: Past Medical History:  Diagnosis Date  . Anemia   . Asthma   . CAD (coronary artery disease)   . Carpal tunnel syndrome   . Cellulitis of both lower extremities 04/11/2015  . CHF (congestive heart failure) ([place])   . Colon polyp, hyperplastic 2007 & 2012  . Complication of anesthesia 1999   svt with renal calculi surgery, no problems since  . CTS (carpal tunnel syndrome)   . Diabetes mellitus   . Eczema   . Hyperlipidemia   . Hypertension   . Leg ulcer ([place]) 04/24/2015   Right lateral leg No evidence of an infection Monitor closely Keep edema controlled   . Meralgia paresthetica    Dr. [name]  . Morbid obesity ([place])   . Morbid obesity ([place])   .  Neuropathy ([facility])    toes and legs  . Osteoarthrosis, unspecified whether generalized or localized, lower leg   . PUD (peptic ulcer disease)   . Shortness of breath dyspnea    with exertion  . Type II or unspecified type diabetes mellitus without mention of complication, not stated as uncontrolled   . Unspecified hereditary and idiopathic peripheral neuropathy   . Vitamin B12 deficiency   . Wound cellulitis    right upper leg, healing well    PAST SURGICAL HISTORY: Past Surgical  History:  Procedure Laterality Date  . ABDOMINAL HYSTERECTOMY    . CARDIAC CATHETERIZATION  2002   non obstructive disease  . colonoscopy with polypectomy  2007 & 2012    hyperplastic ;Dr Watt Climes  . COLONOSCOPY WITH PROPOFOL N/A 06/04/2015   Procedure: COLONOSCOPY WITH PROPOFOL;  Surgeon: Jerene Bears, MD;  Location: [facility];  Service: Gastroenterology;  Laterality: N/A;  . DILATION AND CURETTAGE OF UTERUS     multiple  . HEMORRHOID SURGERY    . renal calculi  12/1997   SVT with induction of anesthesia  . right knee arthroscopy    . TONSILLECTOMY AND ADENOIDECTOMY      FAMILY HISTORY: Family History  Problem Relation Age of Onset  . Colon cancer Mother   . Prostate cancer Father   . Colon cancer Father   . Diabetes Maternal Aunt   . Breast cancer Maternal Aunt   . Diabetes Maternal Uncle   . Diabetes Paternal Aunt   . Stroke Paternal Aunt     > 65  . Heart disease Paternal Aunt   . Diabetes Paternal Uncle   . Breast cancer Maternal Aunt      X 2  . Breast cancer Cousin     SOCIAL HISTORY: Social History   Social History  . Marital status: Married    Spouse name: [name]  . Number of children: 1  . Years of education: BS   Occupational History  . Disabled Retired   Social History Main Topics  . Smoking status: Never Smoker  . Smokeless tobacco: Never Used  . Alcohol use No  . Drug use: No  . Sexual activity: Not on file   Other Topics Concern  . Not on file   Social  History Narrative   Patient lives at home with her husband [name] Patient is retired and has a Conservation officer, nature.    Caffeine - some times.   Right handed.     PHYSICAL EXAM  There were no vitals filed for this visit. There is no height or weight on file to calculate BMI.  Generalized: Well developed, morbidly obesein no acute distress well-groomed Head: normocephalic and atraumatic,. Oropharynx benign  Neck: Supple, no carotid bruits  Cardiac: Regular rate rhythm, no murmur  Musculoskeletal: No deformity Skin bilateral lower extremity discoloration and thick extremely dry skin  Neurological examination   Mentation: Alert oriented to time, place, history taking. Attention span and concentration appropriate. Recent and remote memory intact.  Follows all commands speech and language fluent.   Cranial nerve II-XII: Pupils were equal round reactive to light extraocular movements were full, visual field were full on confrontational test. Facial sensation and strength were normal. hearing was intact to finger rubbing bilaterally. Uvula tongue midline. head turning and shoulder shrug were normal and symmetric.Tongue protrusion into cheek strength was normal. Motor: normal bulk and tone, full strength in the BUE, BLE, fine finger movements normal, no pronator drift.  Sensory: length dependent decreased touch, pinprick to knee level decreased vibratory sense to knees, she has decreased light touch and pinprick at finger pads, bilateral wrist Tinel signs. Coordination: finger-nose-finger, intact, unable to do heel-to-shin due to body body habitus. dysmetria Reflexes: hyporeflexic absent ankle jerks Gait and Station: not ambulated in wheelchair  DIAGNOSTIC DATA (LABS, IMAGING, TESTING) - I reviewed patient records, labs, notes, testing and imaging myself where available.  Lab Results  Component Value Date   WBC 6.4 12/11/2015   HGB 12.5 12/11/2015   HCT 38.9  12/11/2015   MCV 89.3  12/11/2015   PLT 187.0 12/11/2015      Component Value Date/Time   NA 144 01/17/2016 1215   NA 145 (H) 10/25/2012 1117   K 3.8 01/17/2016 1215   CL 106 01/17/2016 1215   CO2 32 01/17/2016 1215   GLUCOSE 96 01/17/2016 1215   BUN 30 (H) 01/17/2016 1215   BUN 24 10/25/2012 1117   CREATININE 1.21 (H) 01/17/2016 1215   CALCIUM 9.2 01/17/2016 1215   PROT 6.6 01/07/2016 1417   PROT 6.4 10/25/2012 1117   ALBUMIN 3.6 01/07/2016 1417   ALBUMIN 4.1 10/25/2012 1117   AST 14 01/07/2016 1417   ALT 11 01/07/2016 1417   ALKPHOS 69 01/07/2016 1417   BILITOT 0.6 01/07/2016 1417   GFRNONAA 51 (L) 04/16/2015 0413   GFRAA 59 (L) 04/16/2015 0413   Lab Results  Component Value Date   CHOL 158 12/21/2014   HDL 37.80 (L) 12/21/2014   LDLCALC 107 (H) 12/21/2014   LDLDIRECT 169.4 05/02/2007   TRIG 67.0 12/21/2014   CHOLHDL 4 12/21/2014   Lab Results  Component Value Date   HGBA1C 5.5 12/11/2015   Lab Results  Component Value Date   VITAMINB12 1,041 (H) 04/10/2015   Lab Results  Component Value Date   TSH 2.74 12/11/2015      ASSESSMENT AND PLAN  66 y.o. year old female  Morbid obese Peripheral neuropathy, diffuse body achy pain.  Keep Cymbalta 60 mg every day  Lyrica 100 mg 3 times a day  Her morbid obesity likely contributed to the constellation of complaints, I have suggested [facility] weight loss program,  She has severe tenderness upon deep palpation at bilateral plantar feet, possibility also including a component of plantar fasciitis, I have suggested heat treatment, stretching,  Lidocaine, diclofenac cream  Marcial Pacas, M.D. Ph.D.  [facility] [place], [place] [address] Phone: [phone] Fax:      [phone]

## 2016-03-23 ENCOUNTER — Other Ambulatory Visit: Payer: Self-pay | Admitting: *Deleted

## 2016-03-23 ENCOUNTER — Telehealth: Payer: Self-pay | Admitting: *Deleted

## 2016-03-23 MED ORDER — LIDOCAINE HCL 2 % EX GEL: CUTANEOUS | 11 refills | Status: DC

## 2016-03-23 NOTE — Telephone Encounter (Addendum)
Update on the following two medications:  1) PA approved by [employer] ([id number]) for diclofenac sodium - valid through 02/01/17.  Pt [id number] - [place]#[id number]. Pharmacy aware and will fill prescription for patient.  2) Patient's insurance will cover Glydo 2% lidocaine gel without a prior authorization.  Dr. [name] has provided new rx to pharmacy for this additional gel.  Pharmacy will order and fill for patient.

## 2016-03-27 ENCOUNTER — Telehealth: Payer: Self-pay | Admitting: Internal Medicine

## 2016-03-27 NOTE — Telephone Encounter (Signed)
Patient called in and wanted to talk to Dr. Quay Burow or April [name]. She would not tell him what it was about. She just said it has to do with herself. Please follow up, Thank you.

## 2016-03-28 ENCOUNTER — Encounter (HOSPITAL_COMMUNITY): Payer: Self-pay | Admitting: Emergency Medicine

## 2016-03-28 ENCOUNTER — Emergency Department (HOSPITAL_COMMUNITY): Payer: Medicare Other

## 2016-03-28 ENCOUNTER — Inpatient Hospital Stay (HOSPITAL_COMMUNITY)
Admission: EM | Admit: 2016-03-28 | Discharge: 2016-04-02 | DRG: 291 | Disposition: A | Discharge status: Skilled Nursing Facility | Payer: Medicare Other | Attending: Family Medicine | Admitting: Family Medicine

## 2016-03-28 DIAGNOSIS — E876 Hypokalemia: Secondary | ICD-10-CM | POA: Diagnosis not present

## 2016-03-28 DIAGNOSIS — Z888 Allergy status to other drugs, medicaments and biological substances status: Secondary | ICD-10-CM

## 2016-03-28 DIAGNOSIS — I5033 Acute on chronic diastolic (congestive) heart failure: Secondary | ICD-10-CM | POA: Diagnosis present

## 2016-03-28 DIAGNOSIS — E1122 Type 2 diabetes mellitus with diabetic chronic kidney disease: Secondary | ICD-10-CM | POA: Diagnosis present

## 2016-03-28 DIAGNOSIS — Z6841 Body Mass Index (BMI) 40.0 and over, adult: Secondary | ICD-10-CM

## 2016-03-28 DIAGNOSIS — I878 Other specified disorders of veins: Secondary | ICD-10-CM | POA: Diagnosis present

## 2016-03-28 DIAGNOSIS — N179 Acute kidney failure, unspecified: Secondary | ICD-10-CM | POA: Diagnosis present

## 2016-03-28 DIAGNOSIS — I2510 Atherosclerotic heart disease of native coronary artery without angina pectoris: Secondary | ICD-10-CM | POA: Diagnosis present

## 2016-03-28 DIAGNOSIS — Z79899 Other long term (current) drug therapy: Secondary | ICD-10-CM

## 2016-03-28 DIAGNOSIS — I130 Hypertensive heart and chronic kidney disease with heart failure and stage 1 through stage 4 chronic kidney disease, or unspecified chronic kidney disease: Principal | ICD-10-CM | POA: Diagnosis present

## 2016-03-28 DIAGNOSIS — I5031 Acute diastolic (congestive) heart failure: Secondary | ICD-10-CM | POA: Diagnosis present

## 2016-03-28 DIAGNOSIS — Z90710 Acquired absence of both cervix and uterus: Secondary | ICD-10-CM

## 2016-03-28 DIAGNOSIS — H538 Other visual disturbances: Secondary | ICD-10-CM | POA: Diagnosis present

## 2016-03-28 DIAGNOSIS — L0390 Cellulitis, unspecified: Secondary | ICD-10-CM | POA: Diagnosis present

## 2016-03-28 DIAGNOSIS — G4733 Obstructive sleep apnea (adult) (pediatric): Secondary | ICD-10-CM | POA: Diagnosis present

## 2016-03-28 DIAGNOSIS — Z833 Family history of diabetes mellitus: Secondary | ICD-10-CM

## 2016-03-28 DIAGNOSIS — R0602 Shortness of breath: Secondary | ICD-10-CM | POA: Diagnosis present

## 2016-03-28 DIAGNOSIS — Z7982 Long term (current) use of aspirin: Secondary | ICD-10-CM | POA: Diagnosis not present

## 2016-03-28 DIAGNOSIS — L97919 Non-pressure chronic ulcer of unspecified part of right lower leg with unspecified severity: Secondary | ICD-10-CM | POA: Diagnosis present

## 2016-03-28 DIAGNOSIS — E1142 Type 2 diabetes mellitus with diabetic polyneuropathy: Secondary | ICD-10-CM | POA: Diagnosis present

## 2016-03-28 DIAGNOSIS — M170 Bilateral primary osteoarthritis of knee: Secondary | ICD-10-CM | POA: Diagnosis present

## 2016-03-28 DIAGNOSIS — E11622 Type 2 diabetes mellitus with other skin ulcer: Secondary | ICD-10-CM | POA: Diagnosis present

## 2016-03-28 DIAGNOSIS — L02415 Cutaneous abscess of right lower limb: Secondary | ICD-10-CM | POA: Diagnosis present

## 2016-03-28 DIAGNOSIS — Z7951 Long term (current) use of inhaled steroids: Secondary | ICD-10-CM

## 2016-03-28 DIAGNOSIS — Z823 Family history of stroke: Secondary | ICD-10-CM

## 2016-03-28 DIAGNOSIS — I959 Hypotension, unspecified: Secondary | ICD-10-CM | POA: Diagnosis present

## 2016-03-28 DIAGNOSIS — N183 Chronic kidney disease, stage 3 unspecified: Secondary | ICD-10-CM | POA: Diagnosis present

## 2016-03-28 DIAGNOSIS — L97929 Non-pressure chronic ulcer of unspecified part of left lower leg with unspecified severity: Secondary | ICD-10-CM | POA: Diagnosis present

## 2016-03-28 DIAGNOSIS — D649 Anemia, unspecified: Secondary | ICD-10-CM | POA: Diagnosis present

## 2016-03-28 DIAGNOSIS — I272 Pulmonary hypertension, unspecified: Secondary | ICD-10-CM | POA: Diagnosis present

## 2016-03-28 DIAGNOSIS — I10 Essential (primary) hypertension: Secondary | ICD-10-CM | POA: Diagnosis not present

## 2016-03-28 DIAGNOSIS — Z880 Allergy status to penicillin: Secondary | ICD-10-CM

## 2016-03-28 DIAGNOSIS — I4581 Long QT syndrome: Secondary | ICD-10-CM | POA: Diagnosis present

## 2016-03-28 DIAGNOSIS — T502X5A Adverse effect of carbonic-anhydrase inhibitors, benzothiadiazides and other diuretics, initial encounter: Secondary | ICD-10-CM | POA: Diagnosis not present

## 2016-03-28 DIAGNOSIS — E1149 Type 2 diabetes mellitus with other diabetic neurological complication: Secondary | ICD-10-CM | POA: Diagnosis present

## 2016-03-28 DIAGNOSIS — Z882 Allergy status to sulfonamides status: Secondary | ICD-10-CM

## 2016-03-28 DIAGNOSIS — E872 Acidosis: Secondary | ICD-10-CM | POA: Diagnosis present

## 2016-03-28 DIAGNOSIS — J45909 Unspecified asthma, uncomplicated: Secondary | ICD-10-CM | POA: Diagnosis present

## 2016-03-28 DIAGNOSIS — E785 Hyperlipidemia, unspecified: Secondary | ICD-10-CM | POA: Diagnosis present

## 2016-03-28 DIAGNOSIS — L03115 Cellulitis of right lower limb: Secondary | ICD-10-CM

## 2016-03-28 DIAGNOSIS — R32 Unspecified urinary incontinence: Secondary | ICD-10-CM | POA: Diagnosis present

## 2016-03-28 DIAGNOSIS — E1140 Type 2 diabetes mellitus with diabetic neuropathy, unspecified: Secondary | ICD-10-CM | POA: Diagnosis not present

## 2016-03-28 DIAGNOSIS — D696 Thrombocytopenia, unspecified: Secondary | ICD-10-CM | POA: Diagnosis present

## 2016-03-28 DIAGNOSIS — L03116 Cellulitis of left lower limb: Secondary | ICD-10-CM

## 2016-03-28 DIAGNOSIS — E860 Dehydration: Secondary | ICD-10-CM | POA: Diagnosis present

## 2016-03-28 LAB — BLOOD GAS, ARTERIAL
Acid-Base Excess: 4.7 mmol/L — ABNORMAL HIGH (ref 0.0–2.0)
BICARBONATE: 30.9 mmol/L — AB (ref 20.0–28.0)
Drawn by: 42624
O2 Content: 2 L/min
O2 Saturation: 91.9 %
PH ART: 7.349 — AB (ref 7.350–7.450)
PO2 ART: 72.2 mmHg — AB (ref 83.0–108.0)
Patient temperature: 98.6
pCO2 arterial: 57.6 mmHg — ABNORMAL HIGH (ref 32.0–48.0)

## 2016-03-28 LAB — COMPREHENSIVE METABOLIC PANEL
ALK PHOS: 76 U/L (ref 38–126)
ALT: 31 U/L (ref 14–54)
ANION GAP: 12 (ref 5–15)
AST: 44 U/L — ABNORMAL HIGH (ref 15–41)
Albumin: 3.1 g/dL — ABNORMAL LOW (ref 3.5–5.0)
BILIRUBIN TOTAL: 1 mg/dL (ref 0.3–1.2)
BUN: 37 mg/dL — ABNORMAL HIGH (ref 6–20)
CALCIUM: 8.8 mg/dL — AB (ref 8.9–10.3)
CO2: 26 mmol/L (ref 22–32)
Chloride: 101 mmol/L (ref 101–111)
Creatinine, Ser: 2.08 mg/dL — ABNORMAL HIGH (ref 0.44–1.00)
GFR calc non Af Amer: 24 mL/min — ABNORMAL LOW (ref >60)
GFR, EST AFRICAN AMERICAN: 28 mL/min — AB (ref >60)
Glucose, Bld: 156 mg/dL — ABNORMAL HIGH (ref 65–99)
Potassium: 3.6 mmol/L (ref 3.5–5.1)
SODIUM: 139 mmol/L (ref 135–145)
TOTAL PROTEIN: 6.3 g/dL — AB (ref 6.5–8.1)

## 2016-03-28 LAB — BASIC METABOLIC PANEL
Anion gap: 8 (ref 5–15)
BUN: 34 mg/dL — ABNORMAL HIGH (ref 6–20)
CALCIUM: 8.6 mg/dL — AB (ref 8.9–10.3)
CO2: 31 mmol/L (ref 22–32)
Chloride: 105 mmol/L (ref 101–111)
Creatinine, Ser: 1.77 mg/dL — ABNORMAL HIGH (ref 0.44–1.00)
GFR calc Af Amer: 34 mL/min — ABNORMAL LOW (ref >60)
GFR calc non Af Amer: 29 mL/min — ABNORMAL LOW (ref >60)
GLUCOSE: 98 mg/dL (ref 65–99)
Potassium: 3.4 mmol/L — ABNORMAL LOW (ref 3.5–5.1)
Sodium: 144 mmol/L (ref 135–145)

## 2016-03-28 LAB — CBC WITH DIFFERENTIAL/PLATELET
BASOS PCT: 0 %
Basophils Absolute: 0 10*3/uL (ref 0.0–0.1)
EOS ABS: 0 10*3/uL (ref 0.0–0.7)
Eosinophils Relative: 0 %
HCT: 37.2 % (ref 36.0–46.0)
HEMOGLOBIN: 11.8 g/dL — AB (ref 12.0–15.0)
LYMPHS PCT: 11 %
Lymphs Abs: 0.9 10*3/uL (ref 0.7–4.0)
MCH: 28.9 pg (ref 26.0–34.0)
MCHC: 31.7 g/dL (ref 30.0–36.0)
MCV: 91 fL (ref 78.0–100.0)
Monocytes Absolute: 0.5 10*3/uL (ref 0.1–1.0)
Monocytes Relative: 6 %
NEUTROS ABS: 7.1 10*3/uL (ref 1.7–7.7)
Neutrophils Relative %: 83 %
Platelets: 109 10*3/uL — ABNORMAL LOW (ref 150–400)
RBC: 4.09 MIL/uL (ref 3.87–5.11)
RDW: 15.1 % (ref 11.5–15.5)
WBC: 8.5 10*3/uL (ref 4.0–10.5)

## 2016-03-28 LAB — CBC
HEMATOCRIT: 33.6 % — AB (ref 36.0–46.0)
Hemoglobin: 10.6 g/dL — ABNORMAL LOW (ref 12.0–15.0)
MCH: 28.9 pg (ref 26.0–34.0)
MCHC: 31.5 g/dL (ref 30.0–36.0)
MCV: 91.6 fL (ref 78.0–100.0)
Platelets: 86 10*3/uL — ABNORMAL LOW (ref 150–400)
RBC: 3.67 MIL/uL — ABNORMAL LOW (ref 3.87–5.11)
RDW: 15.2 % (ref 11.5–15.5)
WBC: 7.6 10*3/uL (ref 4.0–10.5)

## 2016-03-28 LAB — BRAIN NATRIURETIC PEPTIDE: B NATRIURETIC PEPTIDE 5: 560.9 pg/mL — AB (ref 0.0–100.0)

## 2016-03-28 LAB — I-STAT CG4 LACTIC ACID, ED: Lactic Acid, Venous: 3.84 mmol/L (ref 0.5–1.9)

## 2016-03-28 LAB — LACTIC ACID, PLASMA: Lactic Acid, Venous: 1.3 mmol/L (ref 0.5–1.9)

## 2016-03-28 LAB — I-STAT TROPONIN, ED: Troponin i, poc: 0.03 ng/mL (ref 0.00–0.08)

## 2016-03-28 LAB — GLUCOSE, CAPILLARY: Glucose-Capillary: 99 mg/dL (ref 65–99)

## 2016-03-28 MED ORDER — NALOXONE HCL 0.4 MG/ML IJ SOLN
0.4000 mg | INTRAMUSCULAR | Status: DC | PRN
  Filled 2016-03-28: qty 1

## 2016-03-28 MED ORDER — ACETAMINOPHEN 325 MG PO TABS: 650.0000 mg | ORAL_TABLET | Freq: Four times a day (QID) | ORAL | Status: DC | PRN

## 2016-03-28 MED ORDER — ALBUTEROL SULFATE (2.5 MG/3ML) 0.083% IN NEBU: 3.0000 mL | INHALATION_SOLUTION | RESPIRATORY_TRACT | Status: DC | PRN

## 2016-03-28 MED ORDER — SPIRONOLACTONE 25 MG PO TABS
25.0000 mg | ORAL_TABLET | Freq: Every day | ORAL | Status: DC
  Administered 2016-03-29 – 2016-04-02 (×5): 25 mg via ORAL
  Filled 2016-03-28 (×5): qty 1

## 2016-03-28 MED ORDER — ONDANSETRON HCL 4 MG/2ML IJ SOLN: 4.0000 mg | Freq: Four times a day (QID) | INTRAMUSCULAR | Status: DC | PRN

## 2016-03-28 MED ORDER — PANTOPRAZOLE SODIUM 40 MG PO TBEC
40.0000 mg | DELAYED_RELEASE_TABLET | Freq: Every day | ORAL | Status: DC
  Administered 2016-03-29 – 2016-04-02 (×5): 40 mg via ORAL
  Filled 2016-03-28 (×5): qty 1

## 2016-03-28 MED ORDER — MOMETASONE FURO-FORMOTEROL FUM 200-5 MCG/ACT IN AERO
2.0000 | INHALATION_SPRAY | Freq: Two times a day (BID) | RESPIRATORY_TRACT | Status: DC
  Administered 2016-03-28 – 2016-04-02 (×8): 2 via RESPIRATORY_TRACT
  Filled 2016-03-28 (×2): qty 8.8

## 2016-03-28 MED ORDER — ENOXAPARIN SODIUM 40 MG/0.4ML ~~LOC~~ SOLN
40.0000 mg | SUBCUTANEOUS | Status: DC
  Administered 2016-03-28: 40 mg via SUBCUTANEOUS
  Filled 2016-03-28: qty 0.4

## 2016-03-28 MED ORDER — NALOXONE HCL 0.4 MG/ML IJ SOLN
INTRAMUSCULAR | Status: AC
  Administered 2016-03-28: 0.4 mg
  Filled 2016-03-28: qty 1

## 2016-03-28 MED ORDER — INSULIN ASPART 100 UNIT/ML ~~LOC~~ SOLN: 0.0000 [IU] | Freq: Every day | SUBCUTANEOUS | Status: DC

## 2016-03-28 MED ORDER — ONDANSETRON HCL 4 MG PO TABS: 4.0000 mg | ORAL_TABLET | Freq: Four times a day (QID) | ORAL | Status: DC | PRN

## 2016-03-28 MED ORDER — PREGABALIN 50 MG PO CAPS
100.0000 mg | ORAL_CAPSULE | Freq: Three times a day (TID) | ORAL | Status: DC
  Administered 2016-03-29 – 2016-04-01 (×11): 100 mg via ORAL
  Filled 2016-03-28 (×14): qty 2

## 2016-03-28 MED ORDER — FLUTICASONE PROPIONATE 50 MCG/ACT NA SUSP
2.0000 | Freq: Every day | NASAL | Status: DC
  Administered 2016-03-30 – 2016-04-02 (×4): 2 via NASAL
  Filled 2016-03-28: qty 16

## 2016-03-28 MED ORDER — SODIUM CHLORIDE 0.9 % IV BOLUS (SEPSIS)
250.0000 mL | Freq: Once | INTRAVENOUS | Status: AC
  Administered 2016-03-28: 250 mL via INTRAVENOUS

## 2016-03-28 MED ORDER — ACETAMINOPHEN 650 MG RE SUPP: 650.0000 mg | Freq: Four times a day (QID) | RECTAL | Status: DC | PRN

## 2016-03-28 MED ORDER — CLINDAMYCIN PHOSPHATE 900 MG/50ML IV SOLN
900.0000 mg | Freq: Once | INTRAVENOUS | Status: AC
  Administered 2016-03-28: 900 mg via INTRAVENOUS
  Filled 2016-03-28: qty 50

## 2016-03-28 MED ORDER — INSULIN ASPART 100 UNIT/ML ~~LOC~~ SOLN
0.0000 [IU] | Freq: Three times a day (TID) | SUBCUTANEOUS | Status: DC
  Administered 2016-03-29: 3 [IU] via SUBCUTANEOUS

## 2016-03-28 MED ORDER — FUROSEMIDE 10 MG/ML IJ SOLN
60.0000 mg | Freq: Two times a day (BID) | INTRAMUSCULAR | Status: DC
  Administered 2016-03-29 (×2): 60 mg via INTRAVENOUS
  Filled 2016-03-28 (×3): qty 6

## 2016-03-28 MED ORDER — TOPIRAMATE 100 MG PO TABS
100.0000 mg | ORAL_TABLET | Freq: Two times a day (BID) | ORAL | Status: DC
  Administered 2016-03-28 – 2016-04-02 (×10): 100 mg via ORAL
  Filled 2016-03-28 (×10): qty 1

## 2016-03-28 MED ORDER — ASPIRIN EC 81 MG PO TBEC
81.0000 mg | DELAYED_RELEASE_TABLET | Freq: Every day | ORAL | Status: DC
  Administered 2016-03-29 – 2016-04-01 (×4): 81 mg via ORAL
  Filled 2016-03-28 (×4): qty 1

## 2016-03-28 MED ORDER — HYDROMORPHONE HCL 1 MG/ML IJ SOLN
1.0000 mg | Freq: Once | INTRAMUSCULAR | Status: AC
  Administered 2016-03-28: 1 mg via INTRAVENOUS
  Filled 2016-03-28: qty 1

## 2016-03-28 MED ORDER — SIMVASTATIN 40 MG PO TABS
40.0000 mg | ORAL_TABLET | Freq: Every day | ORAL | Status: DC
  Administered 2016-03-28 – 2016-03-31 (×4): 40 mg via ORAL
  Filled 2016-03-28 (×4): qty 1

## 2016-03-28 MED ORDER — FUROSEMIDE 10 MG/ML IJ SOLN
40.0000 mg | Freq: Once | INTRAMUSCULAR | Status: AC
  Administered 2016-03-28: 40 mg via INTRAVENOUS
  Filled 2016-03-28: qty 4

## 2016-03-28 MED ORDER — DULOXETINE HCL 30 MG PO CPEP
60.0000 mg | ORAL_CAPSULE | Freq: Every day | ORAL | Status: DC
  Administered 2016-03-29 – 2016-04-02 (×5): 60 mg via ORAL
  Filled 2016-03-28 (×5): qty 2

## 2016-03-28 NOTE — ED Triage Notes (Signed)
Per pt/husband-states lower extremity edema and wounds-states decreased mobility on left-unable to ambulate, pivot-states LE wounds open and draining

## 2016-03-28 NOTE — ED Notes (Signed)
Pt O2 sats noted to be 75% after given dilaudid. Pt placed on O2 2lpm and instructed to take deep breaths. Sats came up to 92%.

## 2016-03-28 NOTE — ED Provider Notes (Signed)
[facility] DEPT Provider Note   CSN: [id number] Arrival date & time: 03/28/16  1237     History   Chief Complaint Chief Complaint  Patient presents with  . Leg Pain    HPI April [name] is a 66 y.o. female history CAD, diastolic heart failure, morbid obesity, here presenting with leg swelling and pain and shortness of breath. Patient has been followed up with wound care center and primary care center and neurology. She recently finished a course of 2 weeks of doxycycline. Patient states that she has been having more leg swelling for the last week. Has some subjective chills. She has been on lasix 80 mg at baseline. She has wound care nurse that changes her dressings and she noticed more drainage from her ulcers. She has a lot of pain despite taking lyrica and topamax. She was admitted a year ago and had echo that showed diastolic dysfunction and nl EF.    The history is provided by the patient.    Past Medical History:  Diagnosis Date  . Anemia   . Asthma   . CAD (coronary artery disease)   . Carpal tunnel syndrome   . Cellulitis of both lower extremities 04/11/2015  . CHF (congestive heart failure) ([place])   . Colon polyp, hyperplastic 2007 & 2012  . Complication of anesthesia 1999   svt with renal calculi surgery, no problems since  . CTS (carpal tunnel syndrome)   . Diabetes mellitus   . Eczema   . Hyperlipidemia   . Hypertension   . Leg ulcer ([place]) 04/24/2015   Right lateral leg No evidence of an infection Monitor closely Keep edema controlled   . Meralgia paresthetica    Dr. [name]  . Morbid obesity ([place])   . Morbid obesity ([place])   . Neuropathy (HCC)    toes and legs  . Osteoarthrosis, unspecified whether generalized or localized, lower leg   . PUD (peptic ulcer disease)   . Shortness of breath dyspnea    with exertion  . Type II or unspecified type diabetes mellitus without mention of complication, not stated as uncontrolled   . Unspecified hereditary and  idiopathic peripheral neuropathy   . Vitamin B12 deficiency   . Wound cellulitis    right upper leg, healing well    Patient Active Problem List   Diagnosis Date Noted  . Whole body pain 03/20/2016  . Peripheral polyneuropathy ([place]) 03/20/2016  . Diabetic peripheral neuropathy ([place]) 03/20/2016  . Osteopenia 01/11/2016  . Chronic kidney disease 01/02/2016  . Infected blister of right leg 01/02/2016  . Hand paresthesia 07/18/2015  . Carpal tunnel syndrome 06/07/2015  . Family history of colon cancer   . Diabetes mellitus with neurological manifestations ([place]) 04/18/2015  . Bilateral leg edema 04/11/2015  . Cellulitis of leg, right 04/11/2015  . Abnormality of gait 01/03/2015  . Hereditary and idiopathic peripheral neuropathy 01/03/2015  . GERD (gastroesophageal reflux disease) 06/17/2014  . Morbid obesity with BMI of 50.0-59.9, adult ([place]) 09/05/2013  . Hx of colonic polyps 12/15/2012  . Vitamin B12 deficiency 11/03/2012  . Intrinsic asthma 03/23/2012  . Chronic diastolic heart failure ([place]) 02/20/2011  . OSA (obstructive sleep apnea) 09/17/2010  . URINARY URGENCY 01/08/2010  . CAD, NATIVE VESSEL 11/20/2008  . OSTEOARTHRITIS, KNEES, BILATERAL, SEVERE 06/14/2008  . Hyperlipidemia 05/10/2007  . Essential hypertension 01/18/2007  . HYPOKALEMIA 04/30/2006  . HX, PERSONAL, PEPTIC ULCER DISEASE 04/30/2006    Past Surgical History:  Procedure Laterality Date  . ABDOMINAL HYSTERECTOMY    .  CARDIAC CATHETERIZATION  2002   non obstructive disease  . colonoscopy with polypectomy  2007 & 2012    hyperplastic ;Dr Watt Climes  . COLONOSCOPY WITH PROPOFOL N/A 06/04/2015   Procedure: COLONOSCOPY WITH PROPOFOL;  Surgeon: Jerene Bears, MD;  Location: [facility] ENDOSCOPY;  Service: Gastroenterology;  Laterality: N/A;  . DILATION AND CURETTAGE OF UTERUS     multiple  . HEMORRHOID SURGERY    . renal calculi  12/1997   SVT with induction of anesthesia  . right knee arthroscopy    . TONSILLECTOMY AND  ADENOIDECTOMY      OB History    No data available       Home Medications    Prior to Admission medications   Medication Sig Start Date End Date Taking? Authorizing Provider  aspirin (ECOTRIN LOW STRENGTH) 81 MG EC tablet Take 81 mg by mouth at bedtime.    Yes Historical Provider, MD  [facility] Liver Oil CAPS Take 1 capsule by mouth daily.    Yes Historical Provider, MD  Flaxseed, Linseed, (FLAX SEED OIL) 1000 MG CAPS Take 1,000 mg by mouth 2 (two) times daily.    Yes Historical Provider, MD  folic acid (FOLVITE) 244 MCG tablet Take 800 mcg by mouth at bedtime.    Yes Historical Provider, MD  Multiple Vitamin (MULTIVITAMIN) tablet Take 1 tablet by mouth daily.     Yes Historical Provider, MD  Omega-3 Fatty Acids (FISH OIL) 1000 MG CAPS Take 1,000 mg by mouth daily.    Yes Historical Provider, MD  albuterol (PROVENTIL HFA) 108 (90 BASE) MCG/ACT inhaler Inhale 2 puffs into the lungs every 4 (four) hours as needed for wheezing or shortness of breath. 03/13/14 05/23/15  Hendricks Limes, MD  budesonide-formoterol [facility]) 160-4.5 MCG/ACT inhaler one - two inhalations every 12 hours; gargle and spit after use Patient taking differently: Inhale 2 puffs into the lungs 2 (two) times daily as needed (shortness of breath). gargle and spit after use 02/19/15   [name], MD  carvedilol (COREG) 25 MG tablet Take 1 tablet (25 mg total) by mouth 2 (two) times daily with a meal. 04/24/15   [name], MD  cyanocobalamin (,VITAMIN B-12,) 1000 MCG/ML injection Inject 1 mL (1,000 mcg total) into the muscle every 30 (thirty) days. 01/19/16   [name], MD  diclofenac sodium (VOLTAREN) 1 % GEL Apply 2 g topically 4 (four) times daily. 03/20/16   Marcial Pacas, MD  docusate sodium (COLACE) 100 MG capsule Take 2-3 capsules (200-300 mg total) by mouth daily. 08/08/15   [name], MD  doxycycline (VIBRA-TABS) 100 MG tablet Take 1 tablet (100 mg total) by mouth 2 (two) times daily. 03/04/16   [name], MD    DULoxetine (CYMBALTA) 60 MG capsule Take 1 capsule (60 mg total) by mouth daily. 07/18/15   Marcial Pacas, MD  fluticasone (FLONASE) 50 MCG/ACT nasal spray Place 2 sprays into both nostrils daily. 12/19/15   Flossie Buffy, NP  furosemide (LASIX) 40 MG tablet Take 2 tablets (80 mg total) by mouth 2 (two) times daily. 03/04/16   [name], MD  glucose blood (ONE TOUCH ULTRA TEST) test strip Use to check blood sugars twice a day Dx E11.9 08/26/15   [name], MD  hydrALAZINE (APRESOLINE) 50 MG tablet take 1 tablet by mouth every 8 hours 12/10/15   [name], MD  Lancets [facility] ULTRASOFT) lancets Use to help check blood sugars twice a day  Dx E11.9 04/29/15   [name], MD  lidocaine (GLYDO) 2 % jelly Apply 2 grams to affected area, if needed. 03/23/16   Marcial Pacas, MD  NEEDLE, DISP, 23 G (EASY TOUCH FLIPLOCK NEEDLES) 23G X 1-1/2" MISC Use to inject B12 once monthly. 01/19/16   [name], MD  omeprazole (PRILOSEC) 40 MG capsule Take 1 capsule (40 mg total) by mouth 2 (two) times daily. Patient taking differently: Take 40 mg by mouth 2 (two) times daily as needed (acid reflux).  08/22/14   Jerene Bears, MD  potassium chloride SA (K-DUR,KLOR-CON) 20 MEQ tablet take 1 tablet by mouth once daily 11/14/15   [name], MD  pregabalin (LYRICA) 100 MG capsule Take 1 capsule (100 mg total) by mouth 3 (three) times daily. 03/04/16   [name], MD  RA COL-RITE 100 MG capsule take 1 capsule by mouth twice a day if needed for MILD CONSTIPATION 09/10/15   [name], MD  simvastatin (ZOCOR) 40 MG tablet Take 1 tablet (40 mg total) by mouth at bedtime. 07/26/15   [name], MD  spironolactone (ALDACTONE) 25 MG tablet Take 1 tablet (25 mg total) by mouth daily. 09/16/15   [name], MD  SYRINGE-NEEDLE, DISP, 3 ML 23G X 1" 3 ML MISC Use with B12 to inject IM every 30 days 01/21/16   [name], MD  topiramate (TOPAMAX) 100 MG tablet Take 1 tablet (100 mg total) by mouth 2 (two) times  daily. 07/15/15   Marcial Pacas, MD  triamcinolone cream (KENALOG) 0.1 %  06/24/15   Historical Provider, MD  verapamil (VERELAN PM) 120 MG 24 hr capsule take 1 capsule by mouth once daily 10/18/15   [name], MD    Family History Family History  Problem Relation Age of Onset  . Colon cancer Mother   . Prostate cancer Father   . Colon cancer Father   . Diabetes Maternal Aunt   . Breast cancer Maternal Aunt   . Diabetes Maternal Uncle   . Diabetes Paternal Aunt   . Stroke Paternal Aunt     > 65  . Heart disease Paternal Aunt   . Diabetes Paternal Uncle   . Breast cancer Maternal Aunt      X 2  . Breast cancer Cousin     Social History Social History  Substance Use Topics  . Smoking status: Never Smoker  . Smokeless tobacco: Never Used  . Alcohol use No     Allergies   Penicillins; Sulfonamide derivatives; Benazepril hcl; Hydrochlorothiazide w-triamterene; Metronidazole; Spironolactone; Torsemide; and Valsartan   Review of Systems Review of Systems  Constitutional: Positive for chills.  Respiratory: Positive for shortness of breath.   Musculoskeletal:       Leg swelling   All other systems reviewed and are negative.    Physical Exam Updated Vital Signs BP 132/77 (BP Location: Left Arm)   Pulse 88   Temp 97.7 F (36.5 C) (Oral)   Resp 16   SpO2 92%   Physical Exam  Constitutional:  Morbidly obese. Chronically ill    HENT:  Head: Normocephalic.  Mouth/Throat: Oropharynx is clear and moist.  Eyes: EOM are normal. Pupils are equal, round, and reactive to light.  Neck: Normal range of motion. Neck supple.  Cardiovascular: Normal rate, regular rhythm and normal heart sounds.   Pulmonary/Chest:  Diminished bilateral bases   Abdominal: Soft. Bowel sounds are normal.  Musculoskeletal:  2+ edema bilateral  legs. Stage 2 ulcer under L thigh with drainage, stage 1 ulcer L calf, stage 2 ulcer R thigh with drainage and stage 1 ulcer R calf  Neurological: She is  alert.  Skin: Skin is warm.  Nursing note and vitals reviewed.    ED Treatments / Results  Labs (all labs ordered are listed, but only abnormal results are displayed) Labs Reviewed  CBC WITH DIFFERENTIAL/PLATELET - Abnormal; Notable for the following:       Result Value   Hemoglobin 11.8 (*)    Platelets 109 (*)    All other components within normal limits  COMPREHENSIVE METABOLIC PANEL - Abnormal; Notable for the following:    Glucose, Bld 156 (*)    BUN 37 (*)    Creatinine, Ser 2.08 (*)    Calcium 8.8 (*)    Total Protein 6.3 (*)    Albumin 3.1 (*)    AST 44 (*)    GFR calc non Af Amer 24 (*)    GFR calc Af Amer 28 (*)    All other components within normal limits  I-STAT CG4 LACTIC ACID, ED - Abnormal; Notable for the following:    Lactic Acid, Venous 3.84 (*)    All other components within normal limits  CULTURE, BLOOD (ROUTINE X 2)  CULTURE, BLOOD (ROUTINE X 2)  BRAIN NATRIURETIC PEPTIDE  I-STAT TROPOININ, ED    EKG  EKG Interpretation  Date/Time:  Saturday March 28 2016 13:26:17 EST Ventricular Rate:  90 PR Interval:    QRS Duration: 98 QT Interval:  415 QTC Calculation: 508 R Axis:   20 Text Interpretation:  Sinus rhythm Nonspecific T abnormalities, anterior leads Prolonged QT interval Baseline wander in lead(s) II III aVF No significant change since last tracing Confirmed by YAO  MD, DAVID ([id number]) on 03/28/2016 1:31:41 PM       Radiology Dg Chest Port 1 View  Result Date: 03/28/2016 CLINICAL DATA:  Shortness of breath.  Fever.  Lower extremity edema. EXAM: PORTABLE CHEST 1 VIEW COMPARISON:  04/16/2015 and 09/05/2013 FINDINGS: Stable mild cardiomegaly and pulmonary vascular congestion. No evidence of acute infiltrate or pleural effusion. Aortic atherosclerosis. IMPRESSION: Stable mild cardiomegaly and pulmonary vascular congestion. No acute findings. Electronically Signed   By: Gaulin Gell M.D.   On: 03/28/2016 14:09    Procedures Procedures  (including critical care time)  Medications Ordered in ED Medications  furosemide (LASIX) injection 40 mg (not administered)  clindamycin (CLEOCIN) IVPB 900 mg (not administered)  HYDROmorphone (DILAUDID) injection 1 mg (1 mg Intravenous Given 03/28/16 1334)     Initial Impression / Assessment and Plan / ED Course  I have reviewed the triage vital signs and the nursing notes.  Pertinent labs & imaging results that were available during my care of the patient were reviewed by me and considered in my medical decision making (see chart for details).     April [name] is a 66 y.o. female here with leg pain and swelling and SOB. Borderline O2 92%. Appears volume overloaded. Also has mild cellulitis. Will get labs, lactate, BNP, CXR.   3:04 PM Lactate 3.8. BNP 560, elevated compared to last year. CXR showed vascular congestion. Cr 2.08, similar to previous. WBC nl. Lactate 3.8. Given IV clinda. Given IV lasix for diuresis. Will admit.    Final Clinical Impressions(s) / ED Diagnoses   Final diagnoses:  None    New Prescriptions New Prescriptions   No medications on file     Drenda Freeze,  MD 03/28/16 [id number]

## 2016-03-28 NOTE — Progress Notes (Signed)
RT stuck pt two times for ABG. Pt refused third stick.  DC'd order

## 2016-03-28 NOTE — ED Notes (Signed)
Report to Joellen Jersey, RN on 4 W

## 2016-03-28 NOTE — H&P (Signed)
History and Physical  April [name]:[id number] DOB: 1950/04/17 DOA: 03/28/2016  Referring physician: Shirlyn Goltz, ER physician  PCP: [name], MD  Outpatient Specialists: [name], neurology  [facility] Patient coming from: Home & is able to ambulate rarely with much assistance. She spends a lot of time in her chair  Chief Complaint: Increased shortness of breath and worsening leg ulcers   HPI: April [name] is a 66 y.o. female with medical history significant of morbid obesity with a weight greater than 360 pounds along with venous stasis and leg ulcers plus history of diastolic heart failure who in the past week started having increased leg swelling and her home wound care nurse noted increased drainage from her ulcers. In addition, she started having more shortness of breath and so her husband brought her into the emergency room today.  ED Course: In the emergency room, patient's creatinine at 2.08 with a baseline around 1.2, BNP of 561 and lactic acid level of 3.84. Chest x-ray suggestive of some mild peripheral edema. Patient given a course of Lasix. Her leg ulcers were noted although is unclear how much of this was an acute infection. Hospitalists were consulted for further evaluation and admission  Review of Systems: Patient seen in the emergency room . Pt is quite somnolent and so I'm unable to get any kind of review of systems from her. She opens her eyes very briefly images back to sleep.   Past Medical History:  Diagnosis Date  . Anemia   . Asthma   . CAD (coronary artery disease)   . Carpal tunnel syndrome   . Cellulitis of both lower extremities 04/11/2015  . CHF (congestive heart failure) ([place])   . Colon polyp, hyperplastic 2007 & 2012  . Complication of anesthesia 1999   svt with renal calculi surgery, no problems since  . CTS (carpal tunnel syndrome)   . Diabetes mellitus   . Eczema   . Hyperlipidemia   . Hypertension   . Leg ulcer ([place]) 04/24/2015     Right lateral leg No evidence of an infection Monitor closely Keep edema controlled   . Meralgia paresthetica    Dr. [name]  . Morbid obesity ([place])   . Morbid obesity ([place])   . Neuropathy (HCC)    toes and legs  . Osteoarthrosis, unspecified whether generalized or localized, lower leg   . PUD (peptic ulcer disease)   . Shortness of breath dyspnea    with exertion  . Type II or unspecified type diabetes mellitus without mention of complication, not stated as uncontrolled   . Unspecified hereditary and idiopathic peripheral neuropathy   . Vitamin B12 deficiency   . Wound cellulitis    right upper leg, healing well   Past Surgical History:  Procedure Laterality Date  . ABDOMINAL HYSTERECTOMY    . CARDIAC CATHETERIZATION  2002   non obstructive disease  . colonoscopy with polypectomy  2007 & 2012    hyperplastic ;Dr Watt Climes  . COLONOSCOPY WITH PROPOFOL N/A 06/04/2015   Procedure: COLONOSCOPY WITH PROPOFOL;  Surgeon: Jerene Bears, MD;  Location: [facility];  Service: Gastroenterology;  Laterality: N/A;  . DILATION AND CURETTAGE OF UTERUS     multiple  . HEMORRHOID SURGERY    . renal calculi  12/1997   SVT with induction of anesthesia  . right knee arthroscopy    . TONSILLECTOMY AND ADENOIDECTOMY      Social History:  reports that she has never smoked. She has  never used smokeless tobacco. She reports that she does not drink alcohol or use drugs.   Allergies  Allergen Reactions  . Penicillins Rash    She was told not to take it anymore. Has patient had a PCN reaction causing immediate rash, facial/tongue/throat swelling, SOB or lightheadedness with hypotension: Yes Has patient had a PCN reaction causing severe rash involving mucus membranes or skin necrosis: No Has patient had a PCN reaction that required hospitalization: No Has patient had a PCN reaction occurring within the last 10 years: No If all of the above answers are "NO", then may proceed with Cephalosporin use.'  .  Sulfonamide Derivatives Anaphylaxis    REACTION: internal "burning"  . Benazepril Hcl Other (See Comments)    Patient unsure of reaction.  . Hydrochlorothiazide W-Triamterene Other (See Comments)    Hypokalemia  . Metronidazole Other (See Comments)    Patient unsure of reaction.  Marland Kitchen Spironolactone     "kidney problems"  . Torsemide Other (See Comments)    Patient unsure of reaction.  . Valsartan Other (See Comments)    Patient unsure of reaction.    Family History  Problem Relation Age of Onset  . Colon cancer Mother   . Prostate cancer Father   . Colon cancer Father   . Diabetes Maternal Aunt   . Breast cancer Maternal Aunt   . Diabetes Maternal Uncle   . Diabetes Paternal Aunt   . Stroke Paternal Aunt     > 65  . Heart disease Paternal Aunt   . Diabetes Paternal Uncle   . Breast cancer Maternal Aunt      X 2  . Breast cancer Cousin       Prior to Admission medications   Medication Sig Start Date End Date Taking? Authorizing Provider  aspirin (ECOTRIN LOW STRENGTH) 81 MG EC tablet Take 81 mg by mouth at bedtime.    Yes Historical Provider, MD  carvedilol (COREG) 25 MG tablet Take 1 tablet (25 mg total) by mouth 2 (two) times daily with a meal. 04/24/15  Yes [name], MD  [facility] Liver Oil CAPS Take 1 capsule by mouth daily.    Yes Historical Provider, MD  cyanocobalamin (,VITAMIN B-12,) 1000 MCG/ML injection Inject 1 mL (1,000 mcg total) into the muscle every 30 (thirty) days. 01/19/16  Yes [name], MD  Flaxseed, Linseed, (FLAX SEED OIL) 1000 MG CAPS Take 1,000 mg by mouth 2 (two) times daily.    Yes Historical Provider, MD  folic acid (FOLVITE) 852 MCG tablet Take 800 mcg by mouth at bedtime.    Yes Historical Provider, MD  furosemide (LASIX) 40 MG tablet Take 2 tablets (80 mg total) by mouth 2 (two) times daily. 03/04/16  Yes [name], MD  gabapentin (NEURONTIN) 300 MG capsule Take 300 mg by mouth 3 (three) times daily.   Yes Historical Provider, MD    hydrALAZINE (APRESOLINE) 50 MG tablet take 1 tablet by mouth every 8 hours 12/10/15  Yes [name], MD  Multiple Vitamin (MULTIVITAMIN) tablet Take 1 tablet by mouth daily.     Yes Historical Provider, MD  Omega-3 Fatty Acids (FISH OIL) 1000 MG CAPS Take 1,000 mg by mouth daily.    Yes Historical Provider, MD  potassium chloride SA (K-DUR,KLOR-CON) 20 MEQ tablet take 1 tablet by mouth once daily 11/14/15  Yes [name], MD  pregabalin (LYRICA) 100 MG capsule Take 1 capsule (100 mg total) by mouth 3 (three) times daily.  03/04/16  Yes [name], MD  simvastatin (ZOCOR) 40 MG tablet Take 1 tablet (40 mg total) by mouth at bedtime. 07/26/15  Yes [name], MD  spironolactone (ALDACTONE) 25 MG tablet Take 1 tablet (25 mg total) by mouth daily. 09/16/15  Yes [name], MD  topiramate (TOPAMAX) 100 MG tablet Take 1 tablet (100 mg total) by mouth 2 (two) times daily. 07/15/15  Yes Marcial Pacas, MD  verapamil (VERELAN PM) 120 MG 24 hr capsule take 1 capsule by mouth once daily 10/18/15  Yes [name], MD  albuterol (PROVENTIL HFA) 108 (90 BASE) MCG/ACT inhaler Inhale 2 puffs into the lungs every 4 (four) hours as needed for wheezing or shortness of breath. 03/13/14 05/23/15  Hendricks Limes, MD  budesonide-formoterol [facility]) 160-4.5 MCG/ACT inhaler one - two inhalations every 12 hours; gargle and spit after use Patient taking differently: Inhale 2 puffs into the lungs 2 (two) times daily as needed (shortness of breath). gargle and spit after use 02/19/15   [name], MD  diclofenac sodium (VOLTAREN) 1 % GEL Apply 2 g topically 4 (four) times daily. 03/20/16   Marcial Pacas, MD  docusate sodium (COLACE) 100 MG capsule Take 2-3 capsules (200-300 mg total) by mouth daily. 08/08/15   [name], MD  DULoxetine (CYMBALTA) 60 MG capsule Take 1 capsule (60 mg total) by mouth daily. 07/18/15   Marcial Pacas, MD  fluticasone (FLONASE) 50 MCG/ACT nasal spray Place 2 sprays into both nostrils daily. 12/19/15    Charlene Brooke Nche, NP  glucose blood (ONE TOUCH ULTRA TEST) test strip Use to check blood sugars twice a day Dx E11.9 08/26/15   [name], MD  Lancets [facility] ULTRASOFT) lancets Use to help check blood sugars twice a day Dx E11.9 04/29/15   [name], MD  lidocaine (GLYDO) 2 % jelly Apply 2 grams to affected area, if needed. 03/23/16   Marcial Pacas, MD  NEEDLE, DISP, 23 G (EASY TOUCH FLIPLOCK NEEDLES) 23G X 1-1/2" MISC Use to inject B12 once monthly. 01/19/16   [name], MD  omeprazole (PRILOSEC) 40 MG capsule Take 1 capsule (40 mg total) by mouth 2 (two) times daily. Patient taking differently: Take 40 mg by mouth 2 (two) times daily as needed (acid reflux).  08/22/14   Jerene Bears, MD  [name] COL-RITE 100 MG capsule take 1 capsule by mouth twice a day if needed for MILD CONSTIPATION 09/10/15   [name], MD  SYRINGE-NEEDLE, DISP, 3 ML 23G X 1" 3 ML MISC Use with B12 to inject IM every 30 days 01/21/16   [name], MD  triamcinolone cream (KENALOG) 0.1 %  06/24/15   Historical Provider, MD    Physical Exam: BP 107/64   Pulse 73   Temp 97.7 F (36.5 C) (Oral)   Resp 10   SpO2 100%   General:  Somnolent, no acute distress  Eyes: Sclera appear nonicteric, X ocular movements are intact  ENT: Normocephalic, atraumatic, mucous members are dry  Neck: Narrow airway, neck is thick, no JVD  Cardiovascular: Regular rate and rhythm, Z6-X0, soft 2/6 systolic ejection murmur  Respiratory: Decreased breath sounds throughout secondary to body habitus  Abdomen: Soft, obese, nontender, positive bowel sounds  Skin: No skin breaks or tears. With patient's severe morbid obesity, she has evidence of ulceration on the back of both of her thighs, although this is difficult to stage and decide my attempts to lift  her legs, I'm not able to have much success in treating the being able to view the backs of her thighs.  Musculoskeletal: No clubbing or cyanosis. 2-3+ edema, mostly pitting.   Psychiatric: Patient is somnolent, but no evidence of psychoses  Neurologic: Reports of peripheral neuropathy, although limited exam secondary to her somnolence.           Labs on Admission:  Basic Metabolic Panel:  Recent Labs Lab 03/28/16 1313  NA 139  K 3.6  CL 101  CO2 26  GLUCOSE 156*  BUN 37*  CREATININE 2.08*  CALCIUM 8.8*   Liver Function Tests:  Recent Labs Lab 03/28/16 1313  AST 44*  ALT 31  ALKPHOS 76  BILITOT 1.0  PROT 6.3*  ALBUMIN 3.1*   No results for input(s): LIPASE, AMYLASE in the last 168 hours. No results for input(s): AMMONIA in the last 168 hours. CBC:  Recent Labs Lab 03/28/16 1313  WBC 8.5  NEUTROABS 7.1  HGB 11.8*  HCT 37.2  MCV 91.0  PLT 109*   Cardiac Enzymes: No results for input(s): CKTOTAL, CKMB, CKMBINDEX, TROPONINI in the last 168 hours.  BNP (last 3 results)  Recent Labs  04/11/15 1606 03/28/16 1313  BNP 75.2 560.9*    ProBNP (last 3 results)  Recent Labs  08/08/15 0953  PROBNP 48.0    CBG: No results for input(s): GLUCAP in the last 168 hours.  Radiological Exams on Admission: Dg Chest Port 1 View  Result Date: 03/28/2016 CLINICAL DATA:  Shortness of breath.  Fever.  Lower extremity edema. EXAM: PORTABLE CHEST 1 VIEW COMPARISON:  04/16/2015 and 09/05/2013 FINDINGS: Stable mild cardiomegaly and pulmonary vascular congestion. No evidence of acute infiltrate or pleural effusion. Aortic atherosclerosis. IMPRESSION: Stable mild cardiomegaly and pulmonary vascular congestion. No acute findings. Electronically Signed   By: Perret Gell M.D.   On: 03/28/2016 14:09    EKG: Independently reviewed. Prolonged QT of 4:15, normal sinus rhythm, T-wave abnormalities in anterior leads-no new findings compared to old EKG   Assessment/Plan Present on Admission: . Acute diastolic CHF (congestive heart failure) ([place]): Have started IV Lasix. Holding ACE inhibitor secondary to renal failure beta blocker secondary to acute  decompensation. . Venous stasis with chronic pressure ulcers on back side of both eyes. Wound care to see. Given normal white count, we'll hold on starting antibiotics for now. . Essential hypertension: Continue some of her home medications. . OSA (obstructive sleep apnea): Nightly CPAP. Patient is slightly somnolent during this hospitalization. We'll check ABG.  Somnolence: This may be secondary to just exhaustion, but we'll also check a blood gas. She is also noted to have listed Lyrica and Neurontin on her MAR which may be causing some additional sedation  . Diabetes mellitus with neurological manifestations [facility]): Sliding scale for now. We'll recheck A1c. Patient is actually controlled by diet only. Her last A1c in November was 5.5. Marland Kitchen Acute kidney injury in the setting of CKD (chronic kidney disease) stage 3, GFR 30-59 ml/min: Suspect this should improve with diuresis and better renal perfusion.  Lactic acidosis: Repeat level in the morning. Have ordered pro-calcitonin level as well. I do not think that she has acute sepsis from her injuries and more this acidosis is from dehydration as evidenced by her acute kidney injury.   Principal Problem:   Acute diastolic CHF (congestive heart failure) (HCC) Active Problems:   Essential hypertension   OSA (obstructive sleep apnea)   Morbid obesity with BMI of 50.0-59.9, adult ([place])  Diabetes mellitus with neurological manifestations (HCC)   CKD (chronic kidney disease) stage 3, GFR 30-59 ml/min   Cellulitis and abscess of foot   AKI (acute kidney injury) ([place])   DVT prophylaxis: Lovenox   Code Status: Full code after discussion with husband   Family Communication: Husband and father-in-law at the bedside   Disposition Plan: Anticipate patient will be here for several days really fully diurese her and understand severity of her leg ulcers   Consults called: Wound care   Admission status: His patient will call her inpatient services  beyond 2 mid nights, will place her in inpatient status   [name] MD [facility] Pager [phone]  If 7PM-7AM, please contact night-coverage [email or web address] Password [facility]  03/28/2016, 5:52 PM

## 2016-03-28 NOTE — ED Notes (Signed)
Pt has a bed ready on 4th floor. Called RN to ensure that pt will have Bariatric bed.

## 2016-03-29 DIAGNOSIS — E876 Hypokalemia: Secondary | ICD-10-CM

## 2016-03-29 DIAGNOSIS — I5033 Acute on chronic diastolic (congestive) heart failure: Secondary | ICD-10-CM

## 2016-03-29 DIAGNOSIS — E1140 Type 2 diabetes mellitus with diabetic neuropathy, unspecified: Secondary | ICD-10-CM

## 2016-03-29 LAB — CBC
HEMATOCRIT: 32.4 % — AB (ref 36.0–46.0)
HEMOGLOBIN: 10.2 g/dL — AB (ref 12.0–15.0)
MCH: 28.8 pg (ref 26.0–34.0)
MCHC: 31.5 g/dL (ref 30.0–36.0)
MCV: 91.5 fL (ref 78.0–100.0)
Platelets: 79 10*3/uL — ABNORMAL LOW (ref 150–400)
RBC: 3.54 MIL/uL — AB (ref 3.87–5.11)
RDW: 15.2 % (ref 11.5–15.5)
WBC: 7 10*3/uL (ref 4.0–10.5)

## 2016-03-29 LAB — BASIC METABOLIC PANEL
ANION GAP: 8 (ref 5–15)
BUN: 33 mg/dL — ABNORMAL HIGH (ref 6–20)
CO2: 29 mmol/L (ref 22–32)
Calcium: 8.5 mg/dL — ABNORMAL LOW (ref 8.9–10.3)
Chloride: 106 mmol/L (ref 101–111)
Creatinine, Ser: 1.77 mg/dL — ABNORMAL HIGH (ref 0.44–1.00)
GFR calc Af Amer: 34 mL/min — ABNORMAL LOW (ref >60)
GFR, EST NON AFRICAN AMERICAN: 29 mL/min — AB (ref >60)
Glucose, Bld: 129 mg/dL — ABNORMAL HIGH (ref 65–99)
POTASSIUM: 3 mmol/L — AB (ref 3.5–5.1)
SODIUM: 143 mmol/L (ref 135–145)

## 2016-03-29 LAB — GLUCOSE, CAPILLARY
GLUCOSE-CAPILLARY: 112 mg/dL — AB (ref 65–99)
Glucose-Capillary: 94 mg/dL (ref 65–99)
Glucose-Capillary: 121 mg/dL — ABNORMAL HIGH (ref 65–99)
Glucose-Capillary: 100 mg/dL — ABNORMAL HIGH (ref 65–99)

## 2016-03-29 LAB — LACTIC ACID, PLASMA: Lactic Acid, Venous: 2.4 mmol/L (ref 0.5–1.9)

## 2016-03-29 MED ORDER — INSULIN ASPART 100 UNIT/ML ~~LOC~~ SOLN
0.0000 [IU] | Freq: Three times a day (TID) | SUBCUTANEOUS | Status: DC
  Administered 2016-03-29: 3 [IU] via SUBCUTANEOUS
  Administered 2016-03-30: 2 [IU] via SUBCUTANEOUS

## 2016-03-29 MED ORDER — ACETAMINOPHEN 325 MG PO TABS
650.0000 mg | ORAL_TABLET | Freq: Four times a day (QID) | ORAL | Status: DC | PRN
Start: 2016-03-29 — End: 2016-04-02
  Administered 2016-03-30 – 2016-04-02 (×7): 650 mg via ORAL
  Filled 2016-03-29 (×7): qty 2

## 2016-03-29 MED ORDER — ENOXAPARIN SODIUM 40 MG/0.4ML ~~LOC~~ SOLN
40.0000 mg | SUBCUTANEOUS | Status: DC
  Administered 2016-03-29 – 2016-04-01 (×4): 40 mg via SUBCUTANEOUS
  Filled 2016-03-29 (×4): qty 0.4

## 2016-03-29 MED ORDER — HYDROMORPHONE HCL 1 MG/ML IJ SOLN: 0.5000 mg | Freq: Four times a day (QID) | INTRAMUSCULAR | Status: DC | PRN

## 2016-03-29 MED ORDER — ACETAMINOPHEN 650 MG RE SUPP: 650.0000 mg | Freq: Four times a day (QID) | RECTAL | Status: DC | PRN

## 2016-03-29 NOTE — Significant Event (Signed)
Rapid Response Event Note  Overview: Time Called: 2148 Arrival Time: 2155 Event Type: Neurologic  Initial Focused Assessment: Patient was in bed with eyes closed. Responds to name with a weak eye opening,and unable to follow commands. Pupil round, equal and responds to light. Respirations shallow but even and regular. BP 88/52(62), HR 69, O2 sats 95% on 2L, RR 10. Patient last recieved Narcotic at 1334.  Interventions: Per rapid response protocol 0.4mg  Narcan administered. 250 NS bolus administered. STAT ABG, CBC, abd BMET ordered ( see chart for results) Plan of Care (if not transferred): Patient responded to interventions, became responsive, was able to follow simple commands. BP 108/69 (82). Event Summary: Name of Physician Notified: Chaney Malling, NP at 2200    at    Outcome: Stayed in room and stabalized  Event End Time: 2233  Jari Favre [place]

## 2016-03-29 NOTE — Consult Note (Addendum)
[place] Nurse wound consult note Reason for Consult:Patient is known to our department, but has not been seen in nearly a year.  Consulted for chronic problem: wounds on right lateral LE that are healing, but painful. Also occasional weeping of LEs bilaterally.  None noted today.  Patient with deep intertriginous skin folds that collect moisture and create moisture dermatitis.  We will order our house antimicrobial textile for that purpose this admission. Wound type: Chronic lymphedema with right lateral LE wounds (healing) and ITD (Intertriginous dermatitis). No pressure injuries, MASD to right posterior thigh Pressure Injury POA: No. Wounds POA, but not PrI Measurement:Right LE distal wound measures 3cm round with depth obscured by the presence of a dry crust of dried serum. Evidence of a healed proximal wound. Intertriginous dermatitis in the behind the knee skin folds bilaterally. Right posterior thigh with partial thickness area of MASD measuring 2cm round x 0.1cm. Left posterior thigh:  5cm x 5cm full thickness wound with 0.2cm depth.  75% red, moist, 25% yellow slough. Small amount serous drainage on old dressing noted.  Two satellite lesions, each 1cm round x 0.2cm depth are located at 10 and 11 o'clock. Wound XKP:[id number], moist and as described above Drainage (amount, consistency, odor) Scant serous from posterior right thigh, all other wounds dry Periwound:intact with hemosiderin staining on bilateral LEs. Patient with neuropathic pain, new to taking Lyrica. Dressing procedure/placement/frequency:Patient reports she is to see someone at the outpatient [facility] on Thursday, March 8th. IN the interim, I will provide orders for conservative topical care of the LEs using xeroform gauze, kerlix wrap topped with ACE wrap changed daily. We have provided a mattress replacement (bariatric with low air loss feature), will float the heels and use our house antimicrobial textile (InterDry Ag+) for the intertriginous skin  fold moisture accumulation issues. Patient is to be turned and repositioned from side to side except for meals.  Minimal linen is to be used beneath patient while in bed so that she can benefit from her air mattress. [place] nursing team will not follow, but will remain available to this patient, the nursing and medical teams.  Please re-consult if needed. Thanks, Maudie Flakes, MSN, RN, [place], Arther Abbott  Pager# [phone]

## 2016-03-29 NOTE — Progress Notes (Signed)
This RN entered room for shift assessment and night medications. Pt minimally responsive to voice, opens eyes for a brief period but then falls back asleep quickly. VSS at this time. CBG 99. RRT called, NP on call, K. Schorr, paged. New orders given per RRT protocol and NP and pt stabilized within 2 hours. Will continue to monitor pt closely. Carnella [place] I

## 2016-03-29 NOTE — Progress Notes (Addendum)
PROGRESS NOTE  April [name]  ZOX:[id number] DOB: 20-Jan-1951  DOA: 03/28/2016 PCP: [name], MD   Brief Narrative:  66 year old female, married and lives with her spouse and son, moves around at home with the assistance of a cane/walker/wheelchair, apparently has not been moving around much in the last 2 weeks, PMH of asthma, CAD, chronic diastolic CHF, DM 2 with peripheral neuropathy, HLD, HTN, chronic bilateral lower extremity wounds/possible venous stasis ulcers being assisted by home health RN, morbid obesity, OSA not on CPAP, presented to ED with few days' history of worsening leg swelling, home health RN noted increased drainage from leg wounds, worsening dyspnea. Admitted for management of acute on chronic diastolic CHF and management of lower extremity wounds.   Assessment & Plan:   Principal Problem:   Acute diastolic CHF (congestive heart failure) (HCC) Active Problems:   Essential hypertension   OSA (obstructive sleep apnea)   Morbid obesity with BMI of 50.0-59.9, adult (HCC)   Diabetes mellitus with neurological manifestations (HCC)   CKD (chronic kidney disease) stage 3, GFR 30-59 ml/min   Cellulitis and abscess of foot   AKI (acute kidney injury) ([place])   Acute on chronic diastolic CHF - 2-D echo 09/12/89: LVEF 65-70 percent and grade 1 diastolic dysfunction. Initiated on IV Lasix 60 mg every 12 hours. -610 ML since admission documented but not sure if this is accurate. Also patient has excruciating pain of her lower extremity ulcers when urinary incontinence touches those wounds. Hence placed Foley catheter on 2/25. - Continue IV Lasix, oral Aldactone and strict intake output monitoring.  Bilateral lower extremity ulcers - Some of them may be venous stasis ulcers but etiology of others not clear.? Pressure wounds related to immobility and morbid obesity. Excruciatingly painful. Clinically do not appear infected. WOC RN consulted for assistance. - Judicious use of opioid  pain medications for comfort. - Temporary placement of Foley catheter to avoid urinary swelling and pain. Also has air overlay mattress.  Morbid obesity/Body mass index is 56.08 kg/m. - Eventually will need weight loss but not sure if she will be able to.? Candidate for bariatric surgery. Outpatient follow-up.  Essential hypertension Controlled. Only on diuretics at this time. ACEI held secondary to renal insufficiency and beta blockers held secondary to decompensated CHF. Resume when able. Hypotension overnight night of admission which has since resolved.  OSA, not on CPAP As per pulmonology follow-up in 2015, mild OSA with plans to continue conservative management and then follow-up but patient never did. Outpatient pulmonology follow-up. CPAP being used in the hospital.  Type II DM with peripheral neuropathy A1c November 5 0.5. Follow repeat A1c. Continue NovoLog SSI. Good inpatient control. Continue topiramate and Lyrica.  Acute on stage III chronic kidney disease Baseline creatinine: 1.2. Presented with creatinine of 2.08. Improved to 1.7. This may be multifactorial related to poor perfusion from decompensated CHF and ACEI-held. Follow BMP closely. Avoid nephrotoxic.  Elevated lactate Not clinically septic. No IV fluids due to volume overload. Has improved from 3.84 > 2.4.  Anemia Stable  Thrombocytopenia:  Unclear etiology. Follow CBCs.  Asthma No clinical bronchospasm.  Somnolence Noted on day of admission. May be multifactorial. Rapid response was activated at midnight on 2/25 for hypotension 88/52 and unresponsiveness. Responded to normal saline bolus and a dose of Narcan.  ABG reviewed. PH 7.35. PCO2 57.6, PaO2 72.2. May have chronic mild hypercapnia. Judicious use of opioids with close monitoring. Somnolence has resolved.   Hypokalemia  - replace and follow.  DVT prophylaxis: Lovenox Code Status: Full Family Communication: None at bedside Disposition Plan:  To be determined. May be a couple of days prior to medical stability for discharge. We will also get PT to evaluate.   Consultants:   None  Procedures:   Foley catheter  Antimicrobials:   None    Subjective: Dyspnea improved but not yet at baseline. Complains of excruciating lower extremity pain at wound sites especially on contact with urine (incontinence) but also has neuropathy pain with tingling and burning-chronic. No chest pain reported.  Objective:  Vitals:   03/28/16 2149 03/28/16 2203 03/29/16 0605 03/29/16 0945  BP: (!) 106/57 (!) 99/54 130/61   Pulse: 67  87   Resp:  13 20   Temp:   98.3 F (36.8 C)   TempSrc:   Oral   SpO2: 97% 95% 95% 93%  Weight:   (!) 152.9 kg (337 lb)   Height:        Intake/Output Summary (Last 24 hours) at 03/29/16 1111 Last data filed at 03/29/16 4742  Gross per 24 hour  Intake              290 ml  Output              900 ml  Net             -610 ml   Filed Weights   03/28/16 1823 03/29/16 0605  Weight: (!) 159.7 kg (352 lb) (!) 152.9 kg (337 lb)    Examination:  General exam: Moderately built and morbidly obese middle-aged female lying comfortably propped up in bed. Does not look septic or toxic. Respiratory system: slightly diminished breath sounds in the bases but otherwise clear to auscultation. Respiratory effort normal. Cardiovascular system: S1 & S2 heard, RRR. No JVD, murmurs, rubs, gallops or clicks. Chronic bilateral lower extremity edema-a lot of this appears chronic. Telemetry: Sinus rhythm. Gastrointestinal system: Abdomen is nondistended, soft and nontender. No organomegaly or masses felt. Normal bowel sounds heard. Central nervous system: Alert and oriented. No focal neurological deficits. Extremities: Symmetric 5 x 5 power in upper extremities. Difficulty moving her lower extremities due to pain and obesity. At least grade 2 x 5 power in lower extremities.  Skin: multiple superficial skin ulcers of different  sizes noted on posterior/lateral aspect of both lower extremities. The ones on the right lower extremity appear healed/scabbed. She has approximately 3 cm diameter superficial clean ulcer over left mid thigh without acute findings. Await WOC RN detailed evaluation.  Psychiatry: Judgement and insight appear normal. Mood & affect appropriate.     Data Reviewed: I have personally reviewed following labs and imaging studies  CBC:  Recent Labs Lab 03/28/16 1313 03/28/16 2228 03/29/16 0112  WBC 8.5 7.6 7.0  NEUTROABS 7.1  --   --   HGB 11.8* 10.6* 10.2*  HCT 37.2 33.6* 32.4*  MCV 91.0 91.6 91.5  PLT 109* 86* 79*   Basic Metabolic Panel:  Recent Labs Lab 03/28/16 1313 03/28/16 2228 03/29/16 0112  NA 139 144 143  K 3.6 3.4* 3.0*  CL 101 105 106  CO2 26 31 29   GLUCOSE 156* 98 129*  BUN 37* 34* 33*  CREATININE 2.08* 1.77* 1.77*  CALCIUM 8.8* 8.6* 8.5*   GFR: Estimated Creatinine Clearance: 47.7 mL/min (by C-G formula based on SCr of 1.77 mg/dL (H)). Liver Function Tests:  Recent Labs Lab 03/28/16 1313  AST 44*  ALT 31  ALKPHOS 76  BILITOT 1.0  PROT 6.3*  ALBUMIN 3.1*   No results for input(s): LIPASE, AMYLASE in the last 168 hours. No results for input(s): AMMONIA in the last 168 hours. Coagulation Profile: No results for input(s): INR, PROTIME in the last 168 hours. Cardiac Enzymes: No results for input(s): CKTOTAL, CKMB, CKMBINDEX, TROPONINI in the last 168 hours. BNP (last 3 results)  Recent Labs  08/08/15 0953  PROBNP 48.0   HbA1C: No results for input(s): HGBA1C in the last 72 hours. CBG:  Recent Labs Lab 03/28/16 2051 03/29/16 0734 03/29/16 1108  GLUCAP 99 94 121*   Lipid Profile: No results for input(s): CHOL, HDL, LDLCALC, TRIG, CHOLHDL, LDLDIRECT in the last 72 hours. Thyroid Function Tests: No results for input(s): TSH, T4TOTAL, FREET4, T3FREE, THYROIDAB in the last 72 hours. Anemia Panel: No results for input(s): VITAMINB12, FOLATE,  FERRITIN, TIBC, IRON, RETICCTPCT in the last 72 hours.  Sepsis Labs:  Recent Labs Lab 03/28/16 1313 03/28/16 1324 03/28/16 2228 03/28/16 2236 03/29/16 0112 03/29/16 0118  WBC 8.5  --  7.6  --  7.0  --   LATICACIDVEN  --  3.84*  --  1.3  --  2.4*    Recent Results (from the past 240 hour(s))  Blood culture (routine x 2)     Status: None (Preliminary result)   Collection Time: 03/28/16  1:40 PM  Result Value Ref Range Status   Specimen Description   Final    BLOOD LEFT ANTECUBITAL Performed at [facility], [place] [address] [place], [place] [address]    Special Requests NONE  Final   Culture PENDING  Incomplete   Report Status PENDING  Incomplete         Radiology Studies: Dg Chest Port 1 View  Result Date: 03/28/2016 CLINICAL DATA:  Shortness of breath.  Fever.  Lower extremity edema. EXAM: PORTABLE CHEST 1 VIEW COMPARISON:  04/16/2015 and 09/05/2013 FINDINGS: Stable mild cardiomegaly and pulmonary vascular congestion. No evidence of acute infiltrate or pleural effusion. Aortic atherosclerosis. IMPRESSION: Stable mild cardiomegaly and pulmonary vascular congestion. No acute findings. Electronically Signed   By: Koker Gell M.D.   On: 03/28/2016 14:09        Scheduled Meds: . aspirin EC  81 mg Oral QHS  . DULoxetine  60 mg Oral Daily  . enoxaparin (LOVENOX) injection  40 mg Subcutaneous Q24H  . fluticasone  2 spray Each Nare Daily  . furosemide  60 mg Intravenous Q12H  . insulin aspart  0-20 Units Subcutaneous TID WC  . insulin aspart  0-5 Units Subcutaneous QHS  . mometasone-formoterol  2 puff Inhalation BID  . pantoprazole  40 mg Oral Daily  . pregabalin  100 mg Oral TID  . simvastatin  40 mg Oral QHS  . spironolactone  25 mg Oral Daily  . topiramate  100 mg Oral BID   Continuous Infusions:   LOS: 1 day     [facility], MD [facility] Pager [phone]  If 7PM-7AM, please contact night-coverage [email or web address] Password  [email or web address] 03/29/2016, 11:11 AM

## 2016-03-30 LAB — BASIC METABOLIC PANEL
Anion gap: 7 (ref 5–15)
BUN: 24 mg/dL — AB (ref 6–20)
CHLORIDE: 104 mmol/L (ref 101–111)
CO2: 31 mmol/L (ref 22–32)
CREATININE: 1.3 mg/dL — AB (ref 0.44–1.00)
Calcium: 8.2 mg/dL — ABNORMAL LOW (ref 8.9–10.3)
GFR calc non Af Amer: 42 mL/min — ABNORMAL LOW (ref >60)
GFR, EST AFRICAN AMERICAN: 49 mL/min — AB (ref >60)
Glucose, Bld: 93 mg/dL (ref 65–99)
Potassium: 3 mmol/L — ABNORMAL LOW (ref 3.5–5.1)
Sodium: 142 mmol/L (ref 135–145)

## 2016-03-30 LAB — GLUCOSE, CAPILLARY
GLUCOSE-CAPILLARY: 92 mg/dL (ref 65–99)
GLUCOSE-CAPILLARY: 167 mg/dL — AB (ref 65–99)
Glucose-Capillary: 105 mg/dL — ABNORMAL HIGH (ref 65–99)
Glucose-Capillary: 98 mg/dL (ref 65–99)

## 2016-03-30 LAB — MAGNESIUM: Magnesium: 2.1 mg/dL (ref 1.7–2.4)

## 2016-03-30 MED ORDER — POTASSIUM CHLORIDE CRYS ER 20 MEQ PO TBCR
40.0000 meq | EXTENDED_RELEASE_TABLET | ORAL | Status: AC
  Administered 2016-03-30 (×2): 40 meq via ORAL
  Filled 2016-03-30 (×2): qty 2

## 2016-03-30 MED ORDER — FUROSEMIDE 10 MG/ML IJ SOLN
60.0000 mg | Freq: Two times a day (BID) | INTRAMUSCULAR | Status: DC
  Administered 2016-03-30 – 2016-04-01 (×5): 60 mg via INTRAVENOUS
  Filled 2016-03-30 (×5): qty 6

## 2016-03-30 NOTE — NC FL2 (Signed)
[place] MEDICAID FL2 LEVEL OF CARE SCREENING TOOL     IDENTIFICATION  Patient Name: April [name] Birthdate: Jan 25, 1951 Sex: female Admission Date (Current Location): 03/28/2016  [facility] and [place] Number:  Herbalist and Address:  [facility],  [place] [address] [place]      Provider Number: [id number]  Attending Physician Name and Address:  Modena Jansky, MD  Relative Name and Phone Number:       Current Level of Care: Hospital Recommended Level of Care: [place] Prior Approval Number:    Date Approved/Denied:   PASRR Number: [id number]  Discharge Plan: SNF    Current Diagnoses: Patient Active Problem List   Diagnosis Date Noted  . Acute diastolic CHF (congestive heart failure) ([place]) 03/28/2016  . Cellulitis and abscess of foot 03/28/2016  . AKI (acute kidney injury) ([place]) 03/28/2016  . Whole body pain 03/20/2016  . Peripheral polyneuropathy ([place]) 03/20/2016  . Diabetic peripheral neuropathy ([place]) 03/20/2016  . Osteopenia 01/11/2016  . CKD (chronic kidney disease) stage 3, GFR 30-59 ml/min 01/02/2016  . Infected blister of right leg 01/02/2016  . Hand paresthesia 07/18/2015  . Carpal tunnel syndrome 06/07/2015  . Family history of colon cancer   . Diabetes mellitus with neurological manifestations ([place]) 04/18/2015  . Bilateral leg edema 04/11/2015  . Cellulitis of leg, right 04/11/2015  . Abnormality of gait 01/03/2015  . Hereditary and idiopathic peripheral neuropathy 01/03/2015  . GERD (gastroesophageal reflux disease) 06/17/2014  . Morbid obesity with BMI of 50.0-59.9, adult ([place]) 09/05/2013  . Hx of colonic polyps 12/15/2012  . Vitamin B12 deficiency 11/03/2012  . Intrinsic asthma 03/23/2012  . Chronic diastolic heart failure ([place]) 02/20/2011  . OSA (obstructive sleep apnea) 09/17/2010  . URINARY URGENCY 01/08/2010  . CAD, NATIVE VESSEL 11/20/2008  . OSTEOARTHRITIS, KNEES, BILATERAL, SEVERE  06/14/2008  . Hyperlipidemia 05/10/2007  . Essential hypertension 01/18/2007  . HYPOKALEMIA 04/30/2006  . HX, PERSONAL, PEPTIC ULCER DISEASE 04/30/2006    Orientation RESPIRATION BLADDER Height & Weight     Self, Time, Situation, Place  O2 (1L/min (nasal cannula)) Indwelling catheter Weight: (!) 339 lb (153.8 kg) Height:  5\' 5"  (165.1 cm)  BEHAVIORAL SYMPTOMS/MOOD NEUROLOGICAL BOWEL NUTRITION STATUS      Continent Diet (heart healthy/carb modified)  AMBULATORY STATUS COMMUNICATION OF NEEDS Skin   Extensive Assist Verbally Other (Comment) (Wound / Incision (Open or Dehisced) 03/28/16 Non-pressure wound Leg Upper Wound is an open blister that is beefy red; (left upper leg) dressed daily)                       Personal Care Assistance Level of Assistance  Bathing, Dressing, Feeding Bathing Assistance: Maximum assistance Feeding assistance: Independent Dressing Assistance: Maximum assistance     Functional Limitations Info  Sight, Hearing, Speech Sight Info: Adequate Hearing Info: Adequate Speech Info: Adequate    SPECIAL CARE FACTORS FREQUENCY  PT (By licensed PT), OT (By licensed OT)     PT Frequency: 5x OT Frequency: 5x            Contractures Contractures Info: Not present    Additional Factors Info  Code Status, Allergies Code Status Info: Full Allergies Info: Hydrochlorothiazide W-triamterene; Penicillins; Benazepril Hcl; Metronidazole; Spironolactone; Torsemide; Valsartan; Sulfonamide Derivatives           Current Medications (03/30/2016):  This is the current hospital active medication list Current Facility-Administered Medications  Medication Dose Route Frequency Provider Last Rate Last Dose  .  acetaminophen (TYLENOL) tablet 650 mg  650 mg Oral Q6H PRN Modena Jansky, MD   650 mg at 03/30/16 5852   Or  . acetaminophen (TYLENOL) suppository 650 mg  650 mg Rectal Q6H PRN Modena Jansky, MD      . albuterol (PROVENTIL) (2.5 MG/3ML) 0.083% nebulizer  solution 3 mL  3 mL Inhalation Q4H PRN [name], MD      . aspirin EC tablet 81 mg  81 mg Oral QHS [name], MD   81 mg at 03/29/16 2103  . DULoxetine (CYMBALTA) DR capsule 60 mg  60 mg Oral Daily [name], MD   60 mg at 03/30/16 1054  . enoxaparin (LOVENOX) injection 40 mg  40 mg Subcutaneous Q24H Minda Ditto, RPH   40 mg at 03/29/16 1812  . fluticasone (FLONASE) 50 MCG/ACT nasal spray 2 spray  2 spray Each Nare Daily [name], MD   2 spray at 03/30/16 1054  . furosemide (LASIX) injection 60 mg  60 mg Intravenous BID Minda Ditto, RPH   60 mg at 03/30/16 0851  . HYDROmorphone (DILAUDID) injection 0.5 mg  0.5 mg Intravenous Q6H PRN Modena Jansky, MD      . insulin aspart (novoLOG) injection 0-9 Units  0-9 Units Subcutaneous TID WC Modena Jansky, MD   2 Units at 03/30/16 1255  . mometasone-formoterol (DULERA) 200-5 MCG/ACT inhaler 2 puff  2 puff Inhalation BID [name], MD   2 puff at 03/29/16 0945  . naloxone [facility]) injection 0.4 mg  0.4 mg Intravenous PRN [name], MD      . ondansetron [facility]) tablet 4 mg  4 mg Oral Q6H PRN [name], MD       Or  . ondansetron (ZOFRAN) injection 4 mg  4 mg Intravenous Q6H PRN [name], MD      . pantoprazole (PROTONIX) EC tablet 40 mg  40 mg Oral Daily [name], MD   40 mg at 03/30/16 1053  . pregabalin (LYRICA) capsule 100 mg  100 mg Oral TID [name], MD   100 mg at 03/30/16 1053  . simvastatin (ZOCOR) tablet 40 mg  40 mg Oral QHS [name], MD   40 mg at 03/29/16 2102  . spironolactone (ALDACTONE) tablet 25 mg  25 mg Oral Daily [name], MD   25 mg at 03/30/16 1054  . topiramate (TOPAMAX) tablet 100 mg  100 mg Oral BID [name], MD   100 mg at 03/30/16 1054     Discharge Medications: Please see discharge summary for a list of discharge medications.  Relevant Imaging Results:  Relevant Lab Results:   Additional  Information SSN [id number]  Burnis Medin, LCSW

## 2016-03-30 NOTE — Telephone Encounter (Signed)
LVM for pt to return call

## 2016-03-30 NOTE — Progress Notes (Signed)
PROGRESS NOTE  April [name]  TJQ:[id number] DOB: [birth year]  DOA: 03/28/2016 PCP: [name], MD   Brief Narrative:  66 year old female, married and lives with her spouse and son, moves around at home with the assistance of a cane/walker/wheelchair, apparently has not been moving around much in the last 2 weeks, PMH of asthma, CAD, chronic diastolic CHF, DM 2 with peripheral neuropathy, HLD, HTN, chronic bilateral lower extremity wounds/possible venous stasis ulcers being assisted by home health RN, morbid obesity, OSA not on CPAP, presented to ED with few days' history of worsening leg swelling, home health RN noted increased drainage from leg wounds, worsening dyspnea. Admitted for management of acute on chronic diastolic CHF and management of lower extremity wounds. Slowly improving.   Assessment & Plan:   Principal Problem:   Acute diastolic CHF (congestive heart failure) (HCC) Active Problems:   Essential hypertension   OSA (obstructive sleep apnea)   Morbid obesity with BMI of 50.0-59.9, adult (HCC)   Diabetes mellitus with neurological manifestations (HCC)   CKD (chronic kidney disease) stage 3, GFR 30-59 ml/min   Cellulitis and abscess of foot   AKI (acute kidney injury) ([place])   Acute on chronic diastolic CHF - 2-D echo 7/62/26: LVEF 65-70 percent and grade 1 diastolic dysfunction. Initiated on IV Lasix 60 mg every 12 hours. - 2670 ML since admission. Also patient has excruciating pain of her lower extremity ulcers when urinary incontinence touches those wounds. Hence placed Foley catheter on 2/25. - Continue IV Lasix, oral Aldactone and strict intake output monitoring. - Improving. Continue additional day of IV Lasix and then consider transitioning to oral Lasix.  Bilateral lower extremity ulcers - Some of them may be venous stasis ulcers but etiology of others not clear.? Pressure wounds related to immobility and morbid obesity. Excruciatingly painful. Clinically do not  appear infected. [facility] RN consultation 2/25 appreciated and continue management per their recommendations. - Judicious use of opioid pain medications for comfort. - Temporary placement of Foley catheter to avoid urinary swelling and pain. Also has air overlay mattress.  Morbid obesity/Body mass index is 56.41 kg/m. - Eventually will need weight loss but not sure if she will be able to.? Candidate for bariatric surgery. Outpatient follow-up.  Essential hypertension Controlled. Only on diuretics at this time. ACEI held secondary to renal insufficiency and beta blockers held secondary to decompensated CHF. Resume when able. Hypotension overnight night of admission which has since resolved.  OSA, not on CPAP As per pulmonology follow-up in 2015, mild OSA with plans to continue conservative management and then follow-up but patient never did. Outpatient pulmonology follow-up. CPAP being used in the hospital.  Type II DM with peripheral neuropathy A1c November 5 0.5. Follow repeat A1c. Continue NovoLog SSI. Good inpatient control. Continue topiramate and Lyrica.  Acute on stage III chronic kidney disease Baseline creatinine: 1.2. Presented with creatinine of 2.08. This may be multifactorial related to poor perfusion from decompensated CHF and ACEI-held. Follow BMP closely. Avoid nephrotoxic. Improved to 1.3.  Elevated lactate Not clinically septic. No IV fluids due to volume overload. Has improved from 3.84 > 2.4.  Anemia Stable  Thrombocytopenia:  Unclear etiology. Follow CBCs. No bleeding reported.  Asthma Although patient reported some wheezing this morning, no overt bronchospasm on exam.   Somnolence Noted on day of admission. May be multifactorial. Rapid response was activated at midnight on 2/25 for hypotension 88/52 and unresponsiveness. Responded to normal saline bolus and a dose of Narcan.  ABG reviewed. [place]  7.35. PCO2 57.6, PaO2 72.2. May have chronic mild  hypercapnia. Judicious use of opioids with close monitoring. Somnolence has resolved.   Hypokalemia  - replace and follow.  Secondary to diuresis. Magnesium 2.1.  DVT prophylaxis: Lovenox Code Status: Full Family Communication: None at bedside Disposition Plan: To be determined. May be in 1-2 days prior to medical stability for discharge. We will also get PT to evaluate.   Consultants:   None  Procedures:   Foley catheter  Antimicrobials:   None    Subjective: States that she had some wheezing this morning but that has improved. Although dyspnea has improved, breathing is not yet at baseline. Pain in lower extremity source has decreased. No chest pain.  Objective:  Vitals:   03/29/16 0945 03/29/16 1512 03/29/16 2057 03/30/16 0544  BP:  (!) 116/57 (!) 112/56 (!) 123/58  Pulse:  83 79 71  Resp:  18 18 18   Temp:  98.3 F (36.8 C) 99.8 F (37.7 C) 99.5 F (37.5 C)  TempSrc:  Oral Oral Oral  SpO2: 93% 95% 95% 99%  Weight:    (!) 153.8 kg (339 lb)  Height:        Intake/Output Summary (Last 24 hours) at 03/30/16 1237 Last data filed at 03/30/16 0543  Gross per 24 hour  Intake              240 ml  Output             2300 ml  Net            -2060 ml   Filed Weights   03/28/16 1823 03/29/16 0605 03/30/16 0544  Weight: (!) 159.7 kg (352 lb) (!) 152.9 kg (337 lb) (!) 153.8 kg (339 lb)    Examination:  General exam: Moderately built and morbidly obese middle-aged female lying comfortably propped up in bed. Does not look septic or toxic. Respiratory system: slightly diminished breath sounds in the bases with occasional basal crackles. No wheezing or rhonchi. Rest of lung fields clear to auscultation. Respiratory effort normal. Cardiovascular system: S1 & S2 heard, RRR. No JVD, murmurs, rubs, gallops or clicks. Chronic bilateral lower extremity edema-a lot of this appears chronic. Telemetry: Sinus rhythm. Gastrointestinal system: Abdomen is nondistended, soft and  nontender. No organomegaly or masses felt. Normal bowel sounds heard. Central nervous system: Alert and oriented. No focal neurological deficits. Extremities: Symmetric 5 x 5 power in upper extremities. Difficulty moving her lower extremities due to pain and obesity. At least grade 2 x 5 power in lower extremities.  Skin: As examined on 2/25, multiple superficial skin ulcers of different sizes noted on posterior/lateral aspect of both lower extremities. The ones on the right lower extremity appear healed/scabbed. She has approximately 3 cm diameter superficial clean ulcer over left mid thigh without acute findings. Please see detailed WOC RN evaluation on 2/25.  Psychiatry: Judgement and insight appear normal. Mood & affect appropriate.     Data Reviewed: I have personally reviewed following labs and imaging studies  CBC:  Recent Labs Lab 03/28/16 1313 03/28/16 2228 03/29/16 0112  WBC 8.5 7.6 7.0  NEUTROABS 7.1  --   --   HGB 11.8* 10.6* 10.2*  HCT 37.2 33.6* 32.4*  MCV 91.0 91.6 91.5  PLT 109* 86* 79*   Basic Metabolic Panel:  Recent Labs Lab 03/28/16 1313 03/28/16 2228 03/29/16 0112 03/30/16 0441  NA 139 144 143 142  K 3.6 3.4* 3.0* 3.0*  CL 101 105 106 104  CO2  26 31 29 31   GLUCOSE 156* 98 129* 93  BUN 37* 34* 33* 24*  CREATININE 2.08* 1.77* 1.77* 1.30*  CALCIUM 8.8* 8.6* 8.5* 8.2*  MG  --   --   --  2.1   GFR: Estimated Creatinine Clearance: 65.2 mL/min (by C-G formula based on SCr of 1.3 mg/dL (H)). Liver Function Tests:  Recent Labs Lab 03/28/16 1313  AST 44*  ALT 31  ALKPHOS 76  BILITOT 1.0  PROT 6.3*  ALBUMIN 3.1*   No results for input(s): LIPASE, AMYLASE in the last 168 hours. No results for input(s): AMMONIA in the last 168 hours. Coagulation Profile: No results for input(s): INR, PROTIME in the last 168 hours. Cardiac Enzymes: No results for input(s): CKTOTAL, CKMB, CKMBINDEX, TROPONINI in the last 168 hours. BNP (last 3 results)  Recent  Labs  08/08/15 0953  PROBNP 48.0   HbA1C: No results for input(s): HGBA1C in the last 72 hours. CBG:  Recent Labs Lab 03/29/16 1108 03/29/16 1633 03/29/16 2102 03/30/16 0753 03/30/16 1214  GLUCAP 121* 112* 100* 92 167*   Lipid Profile: No results for input(s): CHOL, HDL, LDLCALC, TRIG, CHOLHDL, LDLDIRECT in the last 72 hours. Thyroid Function Tests: No results for input(s): TSH, T4TOTAL, FREET4, T3FREE, THYROIDAB in the last 72 hours. Anemia Panel: No results for input(s): VITAMINB12, FOLATE, FERRITIN, TIBC, IRON, RETICCTPCT in the last 72 hours.  Sepsis Labs:  Recent Labs Lab 03/28/16 1313 03/28/16 1324 03/28/16 2228 03/28/16 2236 03/29/16 0112 03/29/16 0118  WBC 8.5  --  7.6  --  7.0  --   LATICACIDVEN  --  3.84*  --  1.3  --  2.4*    Recent Results (from the past 240 hour(s))  Blood culture (routine x 2)     Status: None (Preliminary result)   Collection Time: 03/28/16  1:15 PM  Result Value Ref Range Status   Specimen Description BLOOD RIGHT ANTECUBITAL  Final   Special Requests BOTTLES DRAWN AEROBIC AND ANAEROBIC 5CC  Final   Culture   Final    NO GROWTH < 24 HOURS Performed at [facility], [place] [address] [place], [place] [address]    Report Status PENDING  Incomplete  Blood culture (routine x 2)     Status: None (Preliminary result)   Collection Time: 03/28/16  1:40 PM  Result Value Ref Range Status   Specimen Description BLOOD LEFT ANTECUBITAL  Final   Special Requests NONE  Final   Culture   Final    NO GROWTH < 24 HOURS Performed at [facility], [place] [address], [place], [place] [address]    Report Status PENDING  Incomplete         Radiology Studies: Dg Chest Port 1 View  Result Date: 03/28/2016 CLINICAL DATA:  Shortness of breath.  Fever.  Lower extremity edema. EXAM: PORTABLE CHEST 1 VIEW COMPARISON:  04/16/2015 and 09/05/2013 FINDINGS: Stable mild cardiomegaly and pulmonary vascular congestion. No evidence of acute  infiltrate or pleural effusion. Aortic atherosclerosis. IMPRESSION: Stable mild cardiomegaly and pulmonary vascular congestion. No acute findings. Electronically Signed   By: Poon Gell M.D.   On: 03/28/2016 14:09        Scheduled Meds: . aspirin EC  81 mg Oral QHS  . DULoxetine  60 mg Oral Daily  . enoxaparin (LOVENOX) injection  40 mg Subcutaneous Q24H  . fluticasone  2 spray Each Nare Daily  . furosemide  60 mg Intravenous BID  . insulin aspart  0-9 Units Subcutaneous TID WC  . mometasone-formoterol  2 puff Inhalation BID  . pantoprazole  40 mg Oral Daily  . potassium chloride  40 mEq Oral Q4H  . pregabalin  100 mg Oral TID  . simvastatin  40 mg Oral QHS  . spironolactone  25 mg Oral Daily  . topiramate  100 mg Oral BID   Continuous Infusions:   LOS: 2 days     [facility], MD [facility] Pager [phone]  If 7PM-7AM, please contact night-coverage [email or web address] Password [facility] 03/30/2016, 12:37 PM

## 2016-03-30 NOTE — Evaluation (Signed)
Physical Therapy Evaluation Patient Details Name: April [name] MRN: [id number] DOB: Nov 04, 1950 Today's Date: 03/30/2016   History of Present Illness   66 y.o. female with medical history significant of morbid obesity with a weight greater than 360 pounds along with venous stasis and leg ulcers plus history of diastolic heart failure who in the past week started having increased leg swelling and her home wound care nurse noted increased drainage from her ulcers. In addition, she started having more shortness of breath. Pt reports she's not been able to walk for 2 weeks 2* LLE "locking up". Dx of acute CHF, AKI, BLE ulcers.   Clinical Impression  Pt admitted with above diagnosis. Pt currently with functional limitations due to the deficits listed below (see PT Problem List). Total assist to advance BLEs towards edge of bed to attempt supine to sit, pt not able to tolerate movement of LLE 2* 10/10 pain, unable to achieve sitting position. Will need mechanical lift for transfers. Pt agreeable to SNF.  Pt will benefit from skilled PT to increase their independence and safety with mobility to allow discharge to the venue listed below.       Follow Up Recommendations SNF;Supervision/Assistance - 24 hour    Equipment Recommendations  None recommended by PT    Recommendations for Other Services       Precautions / Restrictions Precautions Precautions: Fall Precaution Comments: 1 fall in past 1 year Restrictions Weight Bearing Restrictions: No      Mobility  Bed Mobility Overal bed mobility: Needs Assistance Bed Mobility: Rolling;Supine to Sit Rolling: +2 for physical assistance   Supine to sit: +2 for physical assistance     General bed mobility comments: attempted supine to sit with total assist to advance LLE (pt 10%) and max A for RLE, pt unable to tolerate movement of LLE 2* 10/10 pain, pt only tolerate minimal movement   Transfers                 General transfer comment:  pt would need mechanical lift for bed to recliner transfer  Ambulation/Gait                Stairs            Wheelchair Mobility    Modified Rankin (Stroke Patients Only)       Balance                                             Pertinent Vitals/Pain Pain Assessment: 0-10 Pain Score: 10-Worst pain ever Pain Location: LLE Pain Descriptors / Indicators: Sore Pain Intervention(s): Limited activity within patient's tolerance;Monitored during session;Premedicated before session;Repositioned    Home Living Family/patient expects to be discharged to:: Private residence Living Arrangements: Spouse/significant other;Children Available Help at Discharge: Family;Available PRN/intermittently Type of Home: House Home Access: Stairs to enter   Entrance Stairs-Number of Steps: 2 Home Layout: Two level;Able to live on main level with bedroom/bathroom Home Equipment: Wheelchair - [employer] - 4 wheels;Bedside commode;Other (comment) (bariatric hospital bed) Additional Comments: aide comes daily    Prior Function Level of Independence: Needs assistance   Gait / Transfers Assistance Needed: needs assist for stairs, walks independently with RW short distances, uses WC for long distances; hasn't been able to walk for past 2 weeks due to LLE "locking up"  ADL's / Homemaking Assistance Needed: assist for sponge bath  Hand Dominance        Extremity/Trunk Assessment   Upper Extremity Assessment Upper Extremity Assessment: Overall WFL for tasks assessed    Lower Extremity Assessment Lower Extremity Assessment: LLE deficits/detail;RLE deficits/detail RLE Deficits / Details: ankle 2/5 -guarded movement 2* pain, hip/knee -2/5 RLE: Unable to fully assess due to pain RLE Sensation: history of peripheral neuropathy;decreased light touch LLE Deficits / Details: ankle 2/5 -guarded movement 2* pain, hip/knee +1/5 limited by pain LLE: Unable to fully  assess due to pain LLE Sensation: history of peripheral neuropathy;decreased light touch    Cervical / Trunk Assessment Cervical / Trunk Assessment:  (Not assessed, pt not able to achieve sitting position)  Communication   Communication: No difficulties  Cognition Arousal/Alertness: Awake/alert Behavior During Therapy: WFL for tasks assessed/performed Overall Cognitive Status: Within Functional Limits for tasks assessed                      General Comments      Exercises     Assessment/Plan    PT Assessment Patient needs continued PT services  PT Problem List Decreased mobility;Decreased strength;Decreased activity tolerance;Impaired sensation;Pain;Decreased skin integrity;Obesity       PT Treatment Interventions Functional mobility training;Therapeutic exercise;Therapeutic activities;Patient/family education    PT Goals (Current goals can be found in the Care Plan section)  Acute Rehab PT Goals Patient Stated Goal: return to being able to walk PT Goal Formulation: With patient Time For Goal Achievement: 04/13/16 Potential to Achieve Goals: Fair    Frequency Min 3X/week   Barriers to discharge        Co-evaluation               End of Session   Activity Tolerance: Patient limited by pain Patient left: in bed;with call bell/phone within reach Nurse Communication: Mobility status;Need for lift equipment PT Visit Diagnosis: History of falling (Z91.81);Muscle weakness (generalized) (M62.81);Pain Pain - Right/Left: Left Pain - part of body: Knee         Time: 1343-1411 PT Time Calculation (min) (ACUTE ONLY): 28 min   Charges:   PT Evaluation $PT Eval Moderate Complexity: 1 Procedure PT Treatments $Therapeutic Activity: 8-22 mins   PT G Codes:         Philomena Doheny 03/30/2016, 2:26 PM [phone]

## 2016-03-30 NOTE — Clinical Social Work Placement (Addendum)
Patient received and accepted bed offer at [facility]. CSW will continue to follow up [facility] to ensure bed is still available when patient is d/c.   CLINICAL SOCIAL WORK PLACEMENT  NOTE  Date:  03/30/2016  Patient Details  Name: April [name] MRN: [id number] Date of Birth: 1950/03/11  Clinical Social Work is seeking post-discharge placement for this patient at the [place] level of care (*CSW will initial, date and re-position this form in  chart as items are completed):  Yes   Patient/family provided with [facility] Work Department's list of facilities offering this level of care within the geographic area requested by the patient (or if unable, by the patient's family).  Yes   Patient/family informed of their freedom to choose among providers that offer the needed level of care, that participate in Medicare, Medicaid or managed care program needed by the patient, have an available bed and are willing to accept the patient.  Yes   Patient/family informed of [facility]'s ownership interest in [facility] and [facility], as well as of the fact that they are under no obligation to receive care at these facilities.  PASRR submitted to EDS on       PASRR number received on       Existing PASRR number confirmed on 03/30/16     FL2 transmitted to all facilities in geographic area requested by pt/family on 03/30/16     FL2 transmitted to all facilities within larger geographic area on 03/30/16     Patient informed that his/her managed care company has contracts with or will negotiate with certain facilities, including the following:         Yes   Patient/family informed of bed offers received.  Patient chooses bed at  [facility]     Physician recommends and patient chooses bed at      Patient to be transferred to   on  .  Patient to be transferred to facility by       Patient family notified on   of  transfer.  Name of family member notified:        PHYSICIAN       Additional Comment:    _______________________________________________ Burnis Medin, LCSW 03/30/2016, 4:11 PM

## 2016-03-30 NOTE — Clinical Social Work Note (Signed)
Clinical Social Work Assessment  Patient Details  Name: April [name] MRN: [id number] Date of Birth: 07/18/1950  Date of referral:  03/30/16               Reason for consult:  Facility Placement                Permission sought to share information with:  Chartered certified accountant granted to share information::  Yes, Verbal Permission Granted  Name::        Agency::     Relationship::     Contact Information:     Housing/Transportation Living arrangements for the past 2 months:  Single Family Home Source of Information:  Patient Patient Interpreter Needed:  None Criminal Activity/Legal Involvement Pertinent to Current Situation/Hospitalization:  No - Comment as needed Significant Relationships:  Adult Children, Spouse Lives with:  Adult Children, Spouse Do you feel safe going back to the place where you live?  No Need for family participation in patient care:  No (Coment)  Care giving concerns:  Patient resides in home with husband and adult son. Patient's husband and son are employed and unable to care for patient 24 hours/day.   Social Worker assessment / plan:  CSW spoke with patient at bedside. CSW introduced self and discussed SNF placement with patient. Patient reported that she has been to a [facility] previously and was not interested in returning to facility. CSW provided patient with local SNF resource. Patient reported that she was interested in [facility], [facility] and [facility]. CSW completed patient's Fl2 and will reach out to facilities of preference.   Employment status:  Retired Nurse, adult PT Recommendations:  [place] / Referral to community resources:  [place]  Patient/Family's Response to care:  Patient reports that she is agreeable to SNF because she wants to get stronger and work on her health. CSW positively affirmed patient's goals and  motivation.  Patient/Family's Understanding of and Emotional Response to Diagnosis, Current Treatment, and Prognosis:  Patient able to verbalize understanding of diagnosis, current treatment and prognosis. Patient cooperative and pleasant when discussing rehab at [facility]. Patient reported that her husband and son are a good source of support.   Emotional Assessment Appearance:  Appears stated age Attitude/Demeanor/Rapport:  Other (Cooperative) Affect (typically observed):  Pleasant Orientation:  Oriented to Self, Oriented to Place, Oriented to  Time, Oriented to Situation Alcohol / Substance use:  Not Applicable Psych involvement (Current and /or in the community):  No (Comment)  Discharge Needs  Concerns to be addressed:  No discharge needs identified Readmission within the last 30 days:  No Current discharge risk:  None Barriers to Discharge:  No Barriers Identified   Burnis Medin, LCSW 03/30/2016, 3:59 PM

## 2016-03-31 DIAGNOSIS — I5031 Acute diastolic (congestive) heart failure: Secondary | ICD-10-CM

## 2016-03-31 LAB — CBC
HCT: 32.3 % — ABNORMAL LOW (ref 36.0–46.0)
Hemoglobin: 9.9 g/dL — ABNORMAL LOW (ref 12.0–15.0)
MCH: 28.8 pg (ref 26.0–34.0)
MCHC: 30.7 g/dL (ref 30.0–36.0)
MCV: 93.9 fL (ref 78.0–100.0)
PLATELETS: 92 10*3/uL — AB (ref 150–400)
RBC: 3.44 MIL/uL — ABNORMAL LOW (ref 3.87–5.11)
RDW: 15.4 % (ref 11.5–15.5)
WBC: 4.6 10*3/uL (ref 4.0–10.5)

## 2016-03-31 LAB — BASIC METABOLIC PANEL
Anion gap: 7 (ref 5–15)
BUN: 19 mg/dL (ref 6–20)
CHLORIDE: 104 mmol/L (ref 101–111)
CO2: 32 mmol/L (ref 22–32)
CREATININE: 1.16 mg/dL — AB (ref 0.44–1.00)
Calcium: 8.3 mg/dL — ABNORMAL LOW (ref 8.9–10.3)
GFR calc Af Amer: 56 mL/min — ABNORMAL LOW (ref >60)
GFR calc non Af Amer: 48 mL/min — ABNORMAL LOW (ref >60)
Glucose, Bld: 97 mg/dL (ref 65–99)
Potassium: 3.3 mmol/L — ABNORMAL LOW (ref 3.5–5.1)
Sodium: 143 mmol/L (ref 135–145)

## 2016-03-31 LAB — GLUCOSE, CAPILLARY
GLUCOSE-CAPILLARY: 100 mg/dL — AB (ref 65–99)
Glucose-Capillary: 107 mg/dL — ABNORMAL HIGH (ref 65–99)
Glucose-Capillary: 117 mg/dL — ABNORMAL HIGH (ref 65–99)
Glucose-Capillary: 102 mg/dL — ABNORMAL HIGH (ref 65–99)

## 2016-03-31 MED ORDER — GUAIFENESIN-DM 100-10 MG/5ML PO SYRP
5.0000 mL | ORAL_SOLUTION | ORAL | Status: DC | PRN
  Administered 2016-03-31: 5 mL via ORAL
  Filled 2016-03-31: qty 10

## 2016-03-31 NOTE — Care Management Note (Signed)
Case Management Note  Patient Details  Name: April [name] MRN: [id number] Date of Birth: August 11, 1950  Subjective/Objective:  Pt admitted with SOB, CHF              Action/Plan:Plan to dc to SNF/GHC   Expected Discharge Date:    04/01/16              Expected Discharge Plan:  Skilled Nursing Facility  In-House Referral:  Clinical Social Work  Discharge planning Services  CM Consult  Post Acute Care Choice:  NA Choice offered to:  NA  DME Arranged:  N/A DME Agency:  NA  HH Arranged:  NA HH Agency:  NA  Status of Service:  Completed, signed off  If discussed at [employer], dates discussed:    Additional CommentsPurcell Mouton, RN 03/31/2016, 2:07 PM

## 2016-03-31 NOTE — Progress Notes (Signed)
NUTRITION NOTE  Pt discussed during AM rounds today. Pt would greatly benefit from diet education (specifically low Na and DM-related education) prior to d/c. RD will follow in order to provide this education.    Jarome Matin, MS, RD, LDN, [facility] Inpatient Clinical Dietitian Pager # [phone] After hours/weekend pager # [phone]

## 2016-03-31 NOTE — Progress Notes (Signed)
Sat study done.  Pt is 94% on 1.5L O2.  Without O2 pt desats to 88% with no activity.

## 2016-03-31 NOTE — Progress Notes (Signed)
PROGRESS NOTE  April [name]  YOV:[id number] DOB: 1950/05/04  DOA: 03/28/2016 PCP: [name], MD  : Brief Narrative:  89 ? PMH of asthma,  CAD-heart cath June 2008 nonobstructive disease, ^ LVEDP 37, V/Q negative Prior chronic diastolic CHF-used to see Dr. Tempie Hoist in the past unclear as to why DM 2 with severe peripheral neuropathy on Cymbalta 60, Lyrica 100 3 times a day under the direction of neurologist Dr. [name] Chronic osteoarthritis status post injection in left knee and prior injections and right knee as well as debilitating arthritis of upper extremities  HLD,  History of hyperplastic polyps on colonoscopy 2012 HTN,  chronic bilateral lower extremity wounds/possible venous stasis ulcers being assisted by home health RN,  morbid obesity,  OSA not on CPAP  lives with her spouse and son, moves around at home with the assistance of a cane/walker/wheelchair,   not been moving around much in the last 2 weeks, , presented to ED with few days' history of worsening leg swelling, home health RN noted increased drainage from leg wounds, worsening dyspnea.  Admitted for management of acute on chronic diastolic CHF and management of lower extremity wounds. Slowly improving.   Assessment & Plan:   Principal Problem:   Acute diastolic CHF (congestive heart failure) (HCC) Active Problems:   Essential hypertension   OSA (obstructive sleep apnea)   Morbid obesity with BMI of 50.0-59.9, adult (HCC)   Diabetes mellitus with neurological manifestations (HCC)   CKD (chronic kidney disease) stage 3, GFR 30-59 ml/min   Cellulitis and abscess of foot   AKI (acute kidney injury) ([place])   Acute on chronic diastolic CHF Component of pulm hypertension Previously seen by cardiology  - 2-D echo 04/13/15: LVEF 65-70 percent and grade 1 diastolic dysfunction. Initiated on IV Lasix 60 mg every 12 hours. - -4.7 ML since admission.  Also patient has excruciating pain of her lower extremity ulcers when  urinary incontinence touches those wounds. Hence placed Foley catheter on 2/25. - Continue IV Lasix, oral Aldactone and strict intake output monitoring. - Improving. Continue additional day of IV Lasix and then consider transitioning to oral Lasix -Last recorded weight 03/04/2016 was 368 pounds. On admission she was 337 and we will need daily weights with a standing scale to determine whether we are making headway with diuresis  Bilateral lower extremity ulcers - Some of them may be venous stasis ulcers but etiology of others not clear.? Pressure wounds related to immobility and morbid obesity. Excruciatingly painful. Clinically do not appear infected. [facility] RN consultation 2/25 appreciated and continue management per their recommendations. - Judicious use of opioid pain medications for comfort. - Temporary placement of Foley catheter to avoid urinary swelling and pain. Also has air overlay mattress--have emphasized to the patient will need to mobilize and get out of bed  Morbid obesity/Body mass index is 56.41 kg/m. - Eventually will need weight loss but not sure if she will be able to.? Candidate for bariatric surgery. Outpatient follow-up.  Essential hypertension Controlled. Only on diuretics at this time.  Verapamil 120 held ,ACEI held , hydralazine 50 every 8 hours held, Coreg 35 twice a day held secondary to renal insufficiency  Resume when able. Hypotension overnight night of admission which has since resolved.  OSA, not on CPAP As per pulmonology follow-up in 2015, mild OSA with plans to continue conservative management and then follow-up but patient never did.  Outpatient pulmonology follow-up. CPAP being used in the hospital. Will probably need a  Night  desaturation screen  Type II DM with peripheral neuropathy A1c November 5 0.5. Follow repeat A1c. Continue NovoLog SSI. Blood sugar trending 98-167 Continue topiramate and Lyrica-will also need follow-up with her neurologist  regarding this  Acute on stage III chronic kidney disease Baseline creatinine: 1.2. Presented with creatinine of 2.08.  This may be multifactorial related to poor perfusion from decompensated CHF and ACEI-held. Follow BMP closely. Avoid nephrotoxic.  Improved to 1.3. And has stabilized in that range  Elevated lactate Not clinically septic. No IV fluids due to volume overload. Has improved from 3.84 > 2.4.  Anemia Stable  Thrombocytopenia: : Unclear etiology. Follow CBCs. No bleeding reported.  Asthma Although patient reported some wheezing this morning, no overt bronchospasm on exam.   Somnolence Noted on day of admission. May be multifactorial. Rapid response was activated at midnight on 2/25 for hypotension 88/52 and unresponsiveness. Responded to normal saline bolus and a dose of Narcan.  ABG reviewed. PH 7.35. PCO2 57.6, PaO2 72.2. May have chronic mild hypercapnia. Judicious use of opioids with close monitoring. Somnolence has resolved.   Hypokalemia  - replace and follow.  Secondary to diuresis. Magnesium 2.1.  Arrhythmia Patient had some episodic nonsustained VT 4 beats overnight and we will monitor  DVT prophylaxis: Lovenox Code Status: Full Family Communication: None at bedside Disposition Plan: To be determined. May be in 1-2 days prior to medical stability for discharge. We will also get PT to evaluate. Suspect may benefit from skilled facility placement will need desaturation screen prior to discharge in order to see if she will qualify for CPAP   Consultants:   None  Procedures:   Foley catheter  Antimicrobials:   None    Subjective:  Alert and oriented multiple somatic complaints mainly weakness in the left leg specifically in the joint line of the left knee some difficulty moving upper extremities and overall feeling poorly Shortness of breath seems to be greatly improved from prior   Objective:  Vitals:   03/30/16 1509 03/30/16 2051 03/30/16  2113 03/31/16 0537  BP: 115/63  134/60 130/66  Pulse: 79  77 68  Resp: 18  18 18   Temp:   99.2 F (37.3 C) 98.5 F (36.9 C)  TempSrc:   Oral Oral  SpO2: 96% 97% 95% 97%  Weight:      Height:        Intake/Output Summary (Last 24 hours) at 03/31/16 0829 Last data filed at 03/31/16 4665  Gross per 24 hour  Intake              720 ml  Output             2775 ml  Net            -2055 ml   Filed Weights   03/28/16 1823 03/29/16 0605 03/30/16 0544  Weight: (!) 159.7 kg (352 lb) (!) 152.9 kg (337 lb) (!) 153.8 kg (339 lb)    Examination:  General exam: Moderately built and morbidly obese middle-aged female lying comfortably propped up in bed. Does not look septic or toxic. Respiratory system: slightly diminished breath sounds in the bases with Poor overall exam given her habitus Cardiovascular system: S1 & S2 heard, RRR. No JVD, murmurs, rubs, gallops or clicks. Chronic bilateral lower extremity edema-a lot of this appears chronic. Telemetry: Sinus rhythm. Gastrointestinal system: Abdomen is nondistended, soft and nontender. No organomegaly or masses felt. Normal bowel sounds heard. Central nervous system: Alert and oriented. No focal  neurological deficits. Extremities: Symmetric 5 x 5 power in upper extremities. Difficulty moving her lower extremities due to pain and obesity. At least grade 2 x 5 power in lower extremities.  Skin: As examined on 2/25, multiple superficial skin ulcers of different sizes noted on posterior/lateral aspect of both lower extremities. The ones on the right lower extremity appear healed/scabbed. She has approximately 3 cm diameter superficial clean ulcer over left mid thigh without acute findings.  Psychiatry: Judgement and insight appear normal. Mood & affect appropriate.     Data Reviewed: I have personally reviewed following labs and imaging studies  CBC:  Recent Labs Lab 03/28/16 1313 03/28/16 2228 03/29/16 0112 03/31/16 0453  WBC 8.5 7.6 7.0  4.6  NEUTROABS 7.1  --   --   --   HGB 11.8* 10.6* 10.2* 9.9*  HCT 37.2 33.6* 32.4* 32.3*  MCV 91.0 91.6 91.5 93.9  PLT 109* 86* 79* 92*   Basic Metabolic Panel:  Recent Labs Lab 03/28/16 1313 03/28/16 2228 03/29/16 0112 03/30/16 0441 03/31/16 0453  NA 139 144 143 142 143  K 3.6 3.4* 3.0* 3.0* 3.3*  CL 101 105 106 104 104  CO2 26 31 29 31  32  GLUCOSE 156* 98 129* 93 97  BUN 37* 34* 33* 24* 19  CREATININE 2.08* 1.77* 1.77* 1.30* 1.16*  CALCIUM 8.8* 8.6* 8.5* 8.2* 8.3*  MG  --   --   --  2.1  --    GFR: Estimated Creatinine Clearance: 73 mL/min (by C-G formula based on SCr of 1.16 mg/dL (H)). Liver Function Tests:  Recent Labs Lab 03/28/16 1313  AST 44*  ALT 31  ALKPHOS 76  BILITOT 1.0  PROT 6.3*  ALBUMIN 3.1*   No results for input(s): LIPASE, AMYLASE in the last 168 hours. No results for input(s): AMMONIA in the last 168 hours. Coagulation Profile: No results for input(s): INR, PROTIME in the last 168 hours. Cardiac Enzymes: No results for input(s): CKTOTAL, CKMB, CKMBINDEX, TROPONINI in the last 168 hours. BNP (last 3 results)  Recent Labs  08/08/15 0953  PROBNP 48.0   HbA1C: No results for input(s): HGBA1C in the last 72 hours. CBG:  Recent Labs Lab 03/30/16 0753 03/30/16 1214 03/30/16 1724 03/30/16 2110 03/31/16 0743  GLUCAP 92 167* 105* 98 107*   Lipid Profile: No results for input(s): CHOL, HDL, LDLCALC, TRIG, CHOLHDL, LDLDIRECT in the last 72 hours. Thyroid Function Tests: No results for input(s): TSH, T4TOTAL, FREET4, T3FREE, THYROIDAB in the last 72 hours. Anemia Panel: No results for input(s): VITAMINB12, FOLATE, FERRITIN, TIBC, IRON, RETICCTPCT in the last 72 hours.  Sepsis Labs:  Recent Labs Lab 03/28/16 1313 03/28/16 1324 03/28/16 2228 03/28/16 2236 03/29/16 0112 03/29/16 0118 03/31/16 0453  WBC 8.5  --  7.6  --  7.0  --  4.6  LATICACIDVEN  --  3.84*  --  1.3  --  2.4*  --     Recent Results (from the past 240  hour(s))  Blood culture (routine x 2)     Status: None (Preliminary result)   Collection Time: 03/28/16  1:15 PM  Result Value Ref Range Status   Specimen Description BLOOD RIGHT ANTECUBITAL  Final   Special Requests BOTTLES DRAWN AEROBIC AND ANAEROBIC 5CC  Final   Culture   Final    NO GROWTH 2 DAYS Performed at [facility], [place] [address], [place], [place] [address]    Report Status PENDING  Incomplete  Blood culture (routine x 2)  Status: None (Preliminary result)   Collection Time: 03/28/16  1:40 PM  Result Value Ref Range Status   Specimen Description BLOOD LEFT ANTECUBITAL  Final   Special Requests NONE  Final   Culture   Final    NO GROWTH 2 DAYS Performed at [name], [address], [place], [place] [address]    Report Status PENDING  Incomplete         Radiology Studies: No results found.      Scheduled Meds: . aspirin EC  81 mg Oral QHS  . DULoxetine  60 mg Oral Daily  . enoxaparin (LOVENOX) injection  40 mg Subcutaneous Q24H  . fluticasone  2 spray Each Nare Daily  . furosemide  60 mg Intravenous BID  . insulin aspart  0-9 Units Subcutaneous TID WC  . mometasone-formoterol  2 puff Inhalation BID  . pantoprazole  40 mg Oral Daily  . pregabalin  100 mg Oral TID  . simvastatin  40 mg Oral QHS  . spironolactone  25 mg Oral Daily  . topiramate  100 mg Oral BID   Continuous Infusions:   LOS: 3 days     Verneita Griffes, MD [facility] ([phone]   If 7PM-7AM, please contact night-coverage [email or web address] Password [facility] 03/31/2016, 8:29 AM

## 2016-03-31 NOTE — Progress Notes (Signed)
BLE cleansed, and wrapped with Kerlix and coband dressing. No visible blisters seen this am. Interdry present in abd folds, behind the knees, and buttocks. Foam to Lt posterior thigh wound and foam to Rt lateral leg wound.  Will continue to monitor patient.

## 2016-04-01 LAB — BASIC METABOLIC PANEL
ANION GAP: 6 (ref 5–15)
BUN: 15 mg/dL (ref 6–20)
CO2: 35 mmol/L — ABNORMAL HIGH (ref 22–32)
Calcium: 8.2 mg/dL — ABNORMAL LOW (ref 8.9–10.3)
Chloride: 103 mmol/L (ref 101–111)
Creatinine, Ser: 1.12 mg/dL — ABNORMAL HIGH (ref 0.44–1.00)
GFR calc Af Amer: 58 mL/min — ABNORMAL LOW (ref >60)
GFR, EST NON AFRICAN AMERICAN: 50 mL/min — AB (ref >60)
Glucose, Bld: 87 mg/dL (ref 65–99)
POTASSIUM: 3.4 mmol/L — AB (ref 3.5–5.1)
SODIUM: 144 mmol/L (ref 135–145)

## 2016-04-01 LAB — GLUCOSE, CAPILLARY
GLUCOSE-CAPILLARY: 93 mg/dL (ref 65–99)
GLUCOSE-CAPILLARY: 99 mg/dL (ref 65–99)
GLUCOSE-CAPILLARY: 96 mg/dL (ref 65–99)
GLUCOSE-CAPILLARY: 100 mg/dL — AB (ref 65–99)

## 2016-04-01 MED ORDER — FUROSEMIDE 40 MG PO TABS
60.0000 mg | ORAL_TABLET | Freq: Two times a day (BID) | ORAL | Status: DC
  Administered 2016-04-01 – 2016-04-02 (×2): 60 mg via ORAL
  Filled 2016-04-01 (×2): qty 1

## 2016-04-01 MED ORDER — CARVEDILOL 25 MG PO TABS
25.0000 mg | ORAL_TABLET | Freq: Two times a day (BID) | ORAL | Status: DC
  Administered 2016-04-01 – 2016-04-02 (×2): 25 mg via ORAL
  Filled 2016-04-01 (×2): qty 1

## 2016-04-01 NOTE — Care Management Important Message (Signed)
Important Message  Patient Details  Name: April [name] MRN: [id number] Date of Birth: [birth year]   Medicare Important Message Given:  Yes    Kerin Salen 04/01/2016, 10:37 AMImportant Message  Patient Details  Name: April [name] MRN: [id number] Date of Birth: 1950-10-11   Medicare Important Message Given:  Yes    Kerin Salen 04/01/2016, 10:37 AM

## 2016-04-01 NOTE — Progress Notes (Signed)
Dr Verlon Au paged to clarify order for apnea monitor.wants pulse ox at bedtime.  Bethann Punches RN

## 2016-04-01 NOTE — Telephone Encounter (Signed)
LVm for pt to return call.

## 2016-04-01 NOTE — Progress Notes (Signed)
PROGRESS NOTE  April [name]  JSE:[id number] DOB: 1950-04-19  DOA: 03/28/2016 PCP: [name], MD  : Brief Narrative:  40 ? PMH of asthma,  CAD-heart cath June 2008 nonobstructive disease, ^ LVEDP 37, V/Q negative Prior chronic diastolic CHF-used to see Dr. Tempie Hoist in the past unclear as to why DM 2 with severe peripheral neuropathy on Cymbalta 60, Lyrica 100 3 times a day under the direction of neurologist Dr. [name] Chronic osteoarthritis status post injection in left knee and prior injections and right knee as well as debilitating arthritis of upper extremities  HLD,  History of hyperplastic polyps on colonoscopy 2012 HTN,  chronic bilateral lower extremity wounds/possible venous stasis ulcers being assisted by home health RN,  morbid obesity,  OSA not on CPAP  lives with her spouse and son, moves around at home with the assistance of a cane/walker/wheelchair,   not been moving around much in the last 2 weeks, , presented to ED with few days' history of worsening leg swelling, home health RN noted increased drainage from leg wounds, worsening dyspnea.  Admitted for management of acute on chronic diastolic CHF and management of lower extremity wounds. Slowly improving.   Assessment & Plan:   Principal Problem:   Acute diastolic CHF (congestive heart failure) (HCC) Active Problems:   Essential hypertension   OSA (obstructive sleep apnea)   Morbid obesity with BMI of 50.0-59.9, adult (HCC)   Diabetes mellitus with neurological manifestations (HCC)   CKD (chronic kidney disease) stage 3, GFR 30-59 ml/min   Cellulitis and abscess of foot   AKI (acute kidney injury) ([place])   Acute on chronic diastolic CHF Component of pulm hypertension Previously seen by cardiology  - 2-D echo 04/13/15: LVEF 65-70 percent and grade 1 diastolic dysfunction. Initiated on IV Lasix 60 mg every 12 hours. -9.38 ML since admission. - Continue IV Lasix, oral Aldactone and strict intake output  monitoring-- Trasnitioned 2/28 to PO lasix 60 bid -Last recorded weight 03/04/2016 was 368 pounds. On admission she was 337---currently at 344 -clamp foley and d/c if possible [will d/c on discharge as poor mobility]  Bilateral lower extremity ulcers - Some of them may be venous stasis ulcers but etiology of others not clear.? Pressure wounds related to immobility and morbid obesity. Excruciatingly painful. Clinically do not appear infected. [place] RN consultation 2/25 appreciated and continue management per their recommendations. - Judicious use of opioid pain medications for comfort. -  air overlay mattress--have emphasized to the patient will need to mobilize and get out of bed, but currently only able to get up and around with hoyer lift  Morbid obesity/Body mass index is 56.86 kg/m. - Eventually will need weight loss but not sure if she will be able to.? Candidate for bariatric surgery. Outpatient follow-up.  Essential hypertension Controlled. Only on diuretics at this time.  Verapamil 120 held, ACEI held secondary to renal insufficiency, hydralazine 50 every 8 hours held, Coreg 35 twice a day resumed 2/28  Resume when able. Hypotension overnight night of admission which has since resolved.  OSA, not on CPAP As per pulmonology follow-up in 2015, mild OSA with plans to continue conservative management and then follow-up but patient never did.  Outpatient pulmonology follow-up. CPAP being used in the hospital. Will probably need a  Night  desaturation screen Will need further follow up  Type II DM with peripheral neuropathy A1c November 5 0.5. Follow repeat A1c. Continue NovoLog SSI. Blood sugar trending 93-117 Continue topiramate and Lyrica-will also need  follow-up with her neurologist regarding this  Acute on stage III chronic kidney disease Baseline creatinine: 1.2. Presented with creatinine of 2.08.  This may be multifactorial related to poor perfusion from decompensated CHF and  ACEI-held. Follow BMP closely. Avoid nephrotoxic.  Improved to 1.3. And has stabilized in that range  Elevated lactate Not clinically septic. No IV fluids due to volume overload. Has improved from 3.84 > 2.4.  Anemia Stable  Thrombocytopenia: : Unclear etiology. Follow CBCs. No bleeding reported.  Asthma Although patient reported some wheezing this morning, no overt bronchospasm on exam.   Somnolence multifactorial. Rapid response was activated at midnight on 2/25 for hypotension 88/52 and unresponsiveness. Responded to normal saline bolus and a dose of Narcan.  ABG reviewed. PH 7.35. PCO2 57.6, PaO2 72.2. May have chronic mild hypercapnia. Judicious use of opioids with close monitoring. Somnolence has resolved.   Hypokalemia  - replace and follow.  Secondary to diuresis. Magnesium 2.1.  Arrhythmia Patient had some episodic nonsustained VT 4 beats overnight and we will monitor  DVT prophylaxis: Lovenox Code Status: Full Family Communication: None at bedside Disposition Plan: SNF in 1-2 days. We will also get PT to evaluate. Suspect may benefit from skilled facility placement will need desaturation screen prior to discharge in order to see if she will qualify for CPAP   Consultants:   None  Procedures:   Foley catheter  Antimicrobials:   None    Subjective:  Doing fair sleepy.  Coughing overngiht Rcx with mucinex. No fever no chills no diarrea-   Objective:  Vitals:   03/31/16 1945 03/31/16 2147 04/01/16 0532 04/01/16 0845  BP:  (!) 144/60 128/67   Pulse:  68 68   Resp:  16 16   Temp:  98.8 F (37.1 C) 98.3 F (36.8 C)   TempSrc:  Oral Oral   SpO2: 95% 97% 97% 96%  Weight:   (!) 155 kg (341 lb 11.4 oz)   Height:        Intake/Output Summary (Last 24 hours) at 04/01/16 0918 Last data filed at 04/01/16 0536  Gross per 24 hour  Intake              840 ml  Output             4600 ml  Net            -3760 ml   Filed Weights   03/29/16 0605  03/30/16 0544 04/01/16 0532  Weight: (!) 152.9 kg (337 lb) (!) 153.8 kg (339 lb) (!) 155 kg (341 lb 11.4 oz)    Examination:  General exam: Moderately built and morbidly obese middle-aged female lying comfortably propped up in bed. Does not look septic or toxic. Respiratory system: slightly diminished breath sounds in the bases with Poor overall exam given her habitus Cardiovascular system: S1 & S2 heard, RRR. No JVD, murmurs, rubs, gallops or clicks. Chronic bilateral lower extremity edema-a lot of this appears chronic. Telemetry: Sinus rhythm. Gastrointestinal system: Abdomen is nondistended, soft and nontender. No organomegaly  Central nervous system: Alert and oriented. No focal neurological deficits. Extremities: Symmetric 5 x 5 power in upper extremities. Difficulty moving her lower extremities due to pain and obesity. At least grade 2 x 5 power in lower extremities.  Skin: As examined on 2/25, multiple superficial skin ulcers of different sizes noted on posterior/lateral aspect of both lower extremities. The ones on the right lower extremity appear healed/scabbed. She has approximately 3 cm diameter superficial clean ulcer  over left mid thigh without acute findings.  Psychiatry: Judgement and insight appear normal. Mood & affect appropriate.     Data Reviewed: I have personally reviewed following labs and imaging studies  CBC:  Recent Labs Lab 03/28/16 1313 03/28/16 2228 03/29/16 0112 03/31/16 0453  WBC 8.5 7.6 7.0 4.6  NEUTROABS 7.1  --   --   --   HGB 11.8* 10.6* 10.2* 9.9*  HCT 37.2 33.6* 32.4* 32.3*  MCV 91.0 91.6 91.5 93.9  PLT 109* 86* 79* 92*   Basic Metabolic Panel:  Recent Labs Lab 03/28/16 2228 03/29/16 0112 03/30/16 0441 03/31/16 0453 04/01/16 0500  NA 144 143 142 143 144  K 3.4* 3.0* 3.0* 3.3* 3.4*  CL 105 106 104 104 103  CO2 31 29 31  32 35*  GLUCOSE 98 129* 93 97 87  BUN 34* 33* 24* 19 15  CREATININE 1.77* 1.77* 1.30* 1.16* 1.12*  CALCIUM 8.6*  8.5* 8.2* 8.3* 8.2*  MG  --   --  2.1  --   --    GFR: Estimated Creatinine Clearance: 76.1 mL/min (by C-G formula based on SCr of 1.12 mg/dL (H)). Liver Function Tests:  Recent Labs Lab 03/28/16 1313  AST 44*  ALT 31  ALKPHOS 76  BILITOT 1.0  PROT 6.3*  ALBUMIN 3.1*   No results for input(s): LIPASE, AMYLASE in the last 168 hours. No results for input(s): AMMONIA in the last 168 hours. Coagulation Profile: No results for input(s): INR, PROTIME in the last 168 hours. Cardiac Enzymes: No results for input(s): CKTOTAL, CKMB, CKMBINDEX, TROPONINI in the last 168 hours. BNP (last 3 results)  Recent Labs  08/08/15 0953  PROBNP 48.0   HbA1C: No results for input(s): HGBA1C in the last 72 hours. CBG:  Recent Labs Lab 03/31/16 0743 03/31/16 1137 03/31/16 1659 03/31/16 2145 04/01/16 0725  GLUCAP 107* 117* 102* 100* 93   Lipid Profile: No results for input(s): CHOL, HDL, LDLCALC, TRIG, CHOLHDL, LDLDIRECT in the last 72 hours. Thyroid Function Tests: No results for input(s): TSH, T4TOTAL, FREET4, T3FREE, THYROIDAB in the last 72 hours. Anemia Panel: No results for input(s): VITAMINB12, FOLATE, FERRITIN, TIBC, IRON, RETICCTPCT in the last 72 hours.  Sepsis Labs:  Recent Labs Lab 03/28/16 1313 03/28/16 1324 03/28/16 2228 03/28/16 2236 03/29/16 0112 03/29/16 0118 03/31/16 0453  WBC 8.5  --  7.6  --  7.0  --  4.6  LATICACIDVEN  --  3.84*  --  1.3  --  2.4*  --     Recent Results (from the past 240 hour(s))  Blood culture (routine x 2)     Status: None (Preliminary result)   Collection Time: 03/28/16  1:15 PM  Result Value Ref Range Status   Specimen Description BLOOD RIGHT ANTECUBITAL  Final   Special Requests BOTTLES DRAWN AEROBIC AND ANAEROBIC 5CC  Final   Culture   Final    NO GROWTH 3 DAYS Performed at [facility], [place] [address], [place], [place] [address]    Report Status PENDING  Incomplete  Blood culture (routine x 2)     Status: None  (Preliminary result)   Collection Time: 03/28/16  1:40 PM  Result Value Ref Range Status   Specimen Description BLOOD LEFT ANTECUBITAL  Final   Special Requests NONE  Final   Culture   Final    NO GROWTH 3 DAYS Performed at [facility], [place] [address], [place], [place] [address]    Report Status PENDING  Incomplete         Radiology Studies: No results found.      Scheduled Meds: . aspirin EC  81 mg Oral QHS  . carvedilol  25 mg Oral BID WC  . DULoxetine  60 mg Oral Daily  . enoxaparin (LOVENOX) injection  40 mg Subcutaneous Q24H  . fluticasone  2 spray Each Nare Daily  . furosemide  60 mg Oral BID  . insulin aspart  0-9 Units Subcutaneous TID WC  . mometasone-formoterol  2 puff Inhalation BID  . pantoprazole  40 mg Oral Daily  . pregabalin  100 mg Oral TID  . spironolactone  25 mg Oral Daily  . topiramate  100 mg Oral BID   Continuous Infusions:   LOS: 4 days     Verneita Griffes, MD [facility] ([phone]   If 7PM-7AM, please contact night-coverage [email or web address] Password [facility] 04/01/2016, 9:18 AM

## 2016-04-01 NOTE — Progress Notes (Signed)
Physical Therapy Treatment Patient Details Name: April [name] MRN: [id number] DOB: 01-24-1951 Today's Date: 04/01/2016    History of Present Illness  66 y.o. female with medical history significant of morbid obesity with a weight greater than 360 pounds along with venous stasis and leg ulcers plus history of diastolic heart failure who in the past week started having increased leg swelling and her home wound care nurse noted increased drainage from her ulcers. In addition, she started having more shortness of breath. Pt reports she's not been able to walk for 2 weeks 2* LLE "locking up". Dx of acute CHF, AKI, BLE ulcers.     PT Comments    Pt tolerated more movement of her LLE during therapeutic exercises today. RN requested pt's weight be taken in maxi move, +2 max assist for rolling to place sling. Pt reported excruciated L posterior thigh pain while in sling, she declined sitting up in recliner 2* pain.    Follow Up Recommendations  SNF;Supervision/Assistance - 24 hour     Equipment Recommendations  None recommended by PT    Recommendations for Other Services       Precautions / Restrictions Precautions Precautions: Fall Precaution Comments: 1 fall in past 1 year Restrictions Weight Bearing Restrictions: No    Mobility  Bed Mobility Overal bed mobility: Needs Assistance Bed Mobility: Rolling Rolling: +2 for physical assistance   MOD assist to roll L, Max assist to roll R         General bed mobility comments: rolling L and R to place maxi move sling under pt, lifted pt above bed briefly to obtain weight, pt very uncomfortable in sling   Transfers                 General transfer comment: pt would need mechanical lift for bed to recliner transfer  Ambulation/Gait                 Stairs            Wheelchair Mobility    Modified Rankin (Stroke Patients Only)       Balance                                    Cognition  Arousal/Alertness: Awake/alert Behavior During Therapy: WFL for tasks assessed/performed Overall Cognitive Status: Within Functional Limits for tasks assessed                      Exercises General Exercises - Lower Extremity Ankle Circles/Pumps: AROM;AAROM;Both;10 reps Quad Sets: AROM;Both;5 reps;Supine Heel Slides: AAROM;Both;10 reps;Supine Hip ABduction/ADduction: AAROM;Both;10 reps;Supine    General Comments        Pertinent Vitals/Pain Pain Score: 10-Worst pain ever Pain Location: L posterior thigh with use of maxi move lift Pain Descriptors / Indicators: Sore Pain Intervention(s): Limited activity within patient's tolerance;Monitored during session;Premedicated before session;Repositioned    Home Living                      Prior Function            PT Goals (current goals can now be found in the care plan section) Acute Rehab PT Goals Patient Stated Goal: return to being able to walk, likes to watch tv PT Goal Formulation: With patient Time For Goal Achievement: 04/13/16 Potential to Achieve Goals: Fair Progress towards PT goals: Progressing toward goals  Frequency    Min 3X/week      PT Plan Current plan remains appropriate    Co-evaluation             End of Session   Activity Tolerance: Patient limited by pain Patient left: in bed;with call bell/phone within reach Nurse Communication: Mobility status;Need for lift equipment PT Visit Diagnosis: History of falling (Z91.81);Muscle weakness (generalized) (M62.81);Pain Pain - Right/Left: Left Pain - part of body: Knee     Time: 1700-1749 PT Time Calculation (min) (ACUTE ONLY): 33 min  Charges:  $Therapeutic Exercise: 8-22 mins $Therapeutic Activity: 8-22 mins                    G Codes:       Philomena Doheny 04/01/2016, 12:05 PM [phone]

## 2016-04-02 LAB — BASIC METABOLIC PANEL
ANION GAP: 5 (ref 5–15)
BUN: 13 mg/dL (ref 6–20)
CHLORIDE: 103 mmol/L (ref 101–111)
CO2: 34 mmol/L — ABNORMAL HIGH (ref 22–32)
Calcium: 8.5 mg/dL — ABNORMAL LOW (ref 8.9–10.3)
Creatinine, Ser: 1 mg/dL (ref 0.44–1.00)
GFR calc non Af Amer: 58 mL/min — ABNORMAL LOW (ref >60)
Glucose, Bld: 86 mg/dL (ref 65–99)
Potassium: 3.2 mmol/L — ABNORMAL LOW (ref 3.5–5.1)
Sodium: 142 mmol/L (ref 135–145)

## 2016-04-02 LAB — CULTURE, BLOOD (ROUTINE X 2)
CULTURE: NO GROWTH
Culture: NO GROWTH

## 2016-04-02 LAB — GLUCOSE, CAPILLARY
GLUCOSE-CAPILLARY: 113 mg/dL — AB (ref 65–99)
Glucose-Capillary: 93 mg/dL (ref 65–99)

## 2016-04-02 MED ORDER — PREGABALIN 50 MG PO CAPS
100.0000 mg | ORAL_CAPSULE | Freq: Two times a day (BID) | ORAL | Status: DC
  Administered 2016-04-02: 100 mg via ORAL

## 2016-04-02 MED ORDER — PREGABALIN 100 MG PO CAPS
100.0000 mg | ORAL_CAPSULE | Freq: Two times a day (BID) | ORAL | 5 refills | Status: DC
Start: 2016-04-02 — End: 2016-05-13

## 2016-04-02 MED ORDER — ATORVASTATIN CALCIUM 20 MG PO TABS: 20.0000 mg | ORAL_TABLET | Freq: Every day | ORAL | 0 refills | Status: DC

## 2016-04-02 NOTE — Discharge Summary (Deleted)
Physician Discharge Summary  April [name] HUD:[id number] DOB: Jun 28, 1950 DOA: 03/28/2016  PCP: [name], MD  Admit date: 03/28/2016 Discharge date: 04/02/2016  Time spent: 30 minutes  Recommendations for Outpatient Follow-up:  1. Recommend that the patient have Chem-7 and CBC in 1 week post discharge 2. Recommend mobilization with [facility] lift and therapy as much as possible to at skilled facility-patient has been counseled about increasing activity and using resources available at skilled facility to have her optimize her care 3. Patient should have a follow-up as it is about 1 month's time with her neurologist Dr. Simon Rhein having side effects from Lyrica and Topamax and have decreased Lyrica from her prior dose to current dose 4. (Daily standing weights 5. Recommend dressings as per below based on nurse instructions--patient should at [place] Thursday March 8 as she does see someone at that facility 6. Will recommend bariatric low air loss bed and floating the heels and using intertribal foreskin fold moisture accumulation 7. The patient didn't desaturate to 83% while sleeping and will qualify for oxygen at least at nighttime for severe sleep apnea-she needs testing as an outpatient with pulmonology or neurology for sleep apnea as this is the cause of her heart failure  Discharge Diagnoses:  Principal Problem:   Acute diastolic CHF (congestive heart failure) ([place]) Active Problems:   Essential hypertension   OSA (obstructive sleep apnea)   Morbid obesity with BMI of 50.0-59.9, adult (HCC)   Diabetes mellitus with neurological manifestations (HCC)   CKD (chronic kidney disease) stage 3, GFR 30-59 ml/min   Cellulitis and abscess of foot   AKI (acute kidney injury) ([place])   Discharge Condition: Improved  Diet recommendation: Heart healthy diabetic  Filed Weights   04/01/16 0532 04/01/16 1151 04/02/16 0520  Weight: (!) 155 kg (341 lb 11.4 oz) (!) 156.4 kg (344 lb 12.8 oz) (!) 150 kg  (330 lb 11 oz)    History of present illness:  65 ? PMH of asthma,  CAD-heart cath June 2008 nonobstructive disease, ^ LVEDP 37, V/Q negative Prior chronic diastolic CHF-used to see Dr. Tempie Hoist in the past unclear as to why DM 2 with severe peripheral neuropathy on Cymbalta 60, Lyrica 100 3 times a day under the direction of neurologist Dr. [name] Chronic osteoarthritis status post injection in left knee and prior injections and right knee as well as debilitating arthritis of upper extremities  HLD,  History of hyperplastic polyps on colonoscopy 2012 HTN,  chronic bilateral lower extremity wounds/possible venous stasis ulcers being assisted by home health RN,  morbid obesity,  OSA not on CPAP  lives with her spouse and son, moves around at home with the assistance of a cane/walker/wheelchair,   not been moving around much in the last 2 weeks, , presented to ED with few days' history of worsening leg swelling, home health RN noted increased drainage from leg wounds, worsening dyspnea.  Hospital Course:  Acute on chronic diastolic CHF Component of pulm hypertension Previously seen by cardiology  - 2-D echo 04/13/15: LVEF 65-70 percent and grade 1 diastolic dysfunction. Initiated on IV Lasix 60 mg every 12 hours. - 10 L L since admission. - Initially placed on IV Lasix 80 twice a day as well as Aldactone 25 and was transitioned to 60 twice a day of Lasix and on discharge home was transitioned to 80 twice a day which is her home dose Discontinue Foley at discharge to facilitate and encourage her to mobilize  Bilateral lower extremity ulcers -  venous stasis ulcers but etiology of others not clear.? Pressure wounds related to immobility and morbid obesity. Excruciatingly painful. Clinically do not appear infected. [place] RN consultation 2/25 appreciated and continue management per their recommendations. - Judicious use of opioid pain medications for comfort.-  air overlay mattress provided at the  hospital and will need it at the facility--have emphasized to the patient will need to mobilize and get out of bed, but currently only able to get up and around with hoyer lift  Morbid obesity/Body mass index is 56.86 kg/m. - Eventually will need weight loss but not sure if she will be able to.? Candidate for bariatric surgery. Outpatient follow-up.  Essential hypertension Controlled. Only on diuretics at this time.  Verapamil 120 held and will be discontinued moving toward as an out agent ACEI held secondary to renal insufficiency, hydralazine 50 every 8 hours held, Coreg 35 twice a day resumed 2/28  Resume when able as an outpatient if felt needed however not felt necessary Hypotension overnight night of admission which has since resolved.  OSA, not on CPAP As per pulmonology follow-up in 2015, mild OSA with plans to continue conservative management and then follow-up but patient never did.  Outpatient pulmonology follow-up. CPAP being used in the hospital. Nighttime desaturation screen showed her oxygen went to 80 percent She will need to be worked up as an outpatient or CPAP once again  Type II DM with peripheral neuropathy A1c November 5 0.5. Follow repeat A1c. Continue NovoLog SSI. Blood sugar trending 93-117 Continue topiramate and Lyrica-will also need follow-up with her neurologist regarding this Patient stated she had some blurred vision and shakiness and we cut lyrica dose on discharge and she will need follow-up with her neurologist regarding this  Acute on stage III chronic kidney disease Baseline creatinine: 1.2. Presented with creatinine of 2.08.  This may be multifactorial related to poor perfusion from decompensated CHF and ACEI-held. Follow BMP closely. Avoid nephrotoxic.  Improved to 1.3. And has stabilized in that range  Elevated lactate Not clinically septic. No IV fluids due to volume overload. Has improved from 3.84 >  2.4.  Anemia Stable  Thrombocytopenia: : Unclear etiology. Follow CBCs. No bleeding reported.  Asthma Although patient reported some wheezing this morning, no overt bronchospasm on exam.     Discharge Exam: Vitals:   04/01/16 2146 04/02/16 0520  BP: 130/60 139/60  Pulse: 65 67  Resp: 18 18  Temp: 98.3 F (36.8 C) 98.6 F (37 C)    General: eomi ncat obese pleasant in nad Cardiovascular:  s1 s2 no m/r/g Respiratory: clear no added sound  Discharge Instructions   Discharge Instructions    Diet - low sodium heart healthy    Complete by:  As directed    Increase activity slowly    Complete by:  As directed      Current Discharge Medication List    START taking these medications   Details  atorvastatin (LIPITOR) 20 MG tablet Take 1 tablet (20 mg total) by mouth daily. Qty: 30 tablet, Refills: 0      CONTINUE these medications which have CHANGED   Details  pregabalin (LYRICA) 100 MG capsule Take 1 capsule (100 mg total) by mouth 2 (two) times daily. Qty: 90 capsule, Refills: 5      CONTINUE these medications which have NOT CHANGED   Details  albuterol (PROVENTIL HFA) 108 (90 BASE) MCG/ACT inhaler Inhale 2 puffs into the lungs every 4 (four) hours as needed for wheezing or  shortness of breath. Qty: 1 Inhaler, Refills: 4    aspirin (ECOTRIN LOW STRENGTH) 81 MG EC tablet Take 81 mg by mouth at bedtime.     budesonide-formoterol (SYMBICORT) 160-4.5 MCG/ACT inhaler one - two inhalations every 12 hours; gargle and spit after use Qty: 1 Inhaler, Refills: 4   Associated Diagnoses: Asthma exacerbation    carvedilol (COREG) 25 MG tablet Take 1 tablet (25 mg total) by mouth 2 (two) times daily with a meal. Qty: 60 tablet, Refills: 3    Cod Liver Oil CAPS Take 1 capsule by mouth daily.     cyanocobalamin (,VITAMIN B-12,) 1000 MCG/ML injection Inject 1 mL (1,000 mcg total) into the muscle every 30 (thirty) days. Qty: 2 mL, Refills: 1    !! docusate sodium  (COLACE) 100 MG capsule Take 2-3 capsules (200-300 mg total) by mouth daily. Qty: 90 capsule, Refills: 11    Flaxseed, Linseed, (FLAX SEED OIL) 1000 MG CAPS Take 1,000 mg by mouth 2 (two) times daily.     fluticasone (FLONASE) 50 MCG/ACT nasal spray Place 2 sprays into both nostrils daily. Qty: 16 g, Refills: 0   Associated Diagnoses: Acute URI    folic acid (FOLVITE) 503 MCG tablet Take 800 mcg by mouth at bedtime.     furosemide (LASIX) 40 MG tablet Take 2 tablets (80 mg total) by mouth 2 (two) times daily. Qty: 360 tablet, Refills: 5    hydrALAZINE (APRESOLINE) 50 MG tablet take 1 tablet by mouth every 8 hours Qty: 270 tablet, Refills: 1   Associated Diagnoses: Uncontrolled hypertension    Multiple Vitamin (MULTIVITAMIN) tablet Take 1 tablet by mouth daily.      Omega-3 Fatty Acids (FISH OIL) 1000 MG CAPS Take 1,000 mg by mouth daily.     potassium chloride SA (K-DUR,KLOR-CON) 20 MEQ tablet take 1 tablet by mouth once daily Qty: 90 tablet, Refills: 3    !! RA COL-RITE 100 MG capsule take 1 capsule by mouth twice a day if needed for MILD CONSTIPATION Qty: 60 capsule, Refills: 0    spironolactone (ALDACTONE) 25 MG tablet Take 1 tablet (25 mg total) by mouth daily. Qty: 30 tablet, Refills: 5    topiramate (TOPAMAX) 100 MG tablet Take 1 tablet (100 mg total) by mouth 2 (two) times daily. Qty: 60 tablet, Refills: 11    diclofenac sodium (VOLTAREN) 1 % GEL Apply 2 g topically 4 (four) times daily. Qty: 100 g, Refills: 11    DULoxetine (CYMBALTA) 60 MG capsule Take 1 capsule (60 mg total) by mouth daily. Qty: 30 capsule, Refills: 12    Lancets (ONETOUCH ULTRASOFT) lancets Use to help check blood sugars twice a day Dx E11.9 Qty: 100 each, Refills: 3    lidocaine (GLYDO) 2 % jelly Apply 2 grams to affected area, if needed. Qty: 30 mL, Refills: 11    NEEDLE, DISP, 23 G (EASY TOUCH FLIPLOCK NEEDLES) 23G X 1-1/2" MISC Use to inject B12 once monthly. Qty: 2 each, Refills: 0     omeprazole (PRILOSEC) 40 MG capsule Take 1 capsule (40 mg total) by mouth 2 (two) times daily. Qty: 60 capsule, Refills: 6    SYRINGE-NEEDLE, DISP, 3 ML 23G X 1" 3 ML MISC Use with B12 to inject IM every 30 days Qty: 50 each, Refills: 0     !! - Potential duplicate medications found. Please discuss with provider.    STOP taking these medications     simvastatin (ZOCOR) 40 MG tablet  verapamil (VERELAN PM) 120 MG 24 hr capsule      glucose blood (ONE TOUCH ULTRA TEST) test strip       : Allergies  Allergen Reactions  . Penicillins Rash    She was told not to take it anymore. Has patient had a PCN reaction causing immediate rash, facial/tongue/throat swelling, SOB or lightheadedness with hypotension: Yes Has patient had a PCN reaction causing severe rash involving mucus membranes or skin necrosis: No Has patient had a PCN reaction that required hospitalization: No Has patient had a PCN reaction occurring within the last 10 years: No If all of the above answers are "NO", then may proceed with Cephalosporin use.'  . Sulfonamide Derivatives Anaphylaxis    REACTION: internal "burning"  . Benazepril Hcl Other (See Comments)    Patient unsure of reaction.  . Hydrochlorothiazide W-Triamterene Other (See Comments)    Hypokalemia  . Metronidazole Other (See Comments)    Patient unsure of reaction.  Marland Kitchen Spironolactone     "kidney problems"  . Torsemide Other (See Comments)    Patient unsure of reaction.  . Valsartan Other (See Comments)    Patient unsure of reaction.   Contact information for after-discharge care    [facility] .   Specialty:  [place] information: [address] [place] [phone]               The results of significant diagnostics from this hospitalization (including imaging, microbiology, ancillary and laboratory) are listed below for reference.    Significant  Diagnostic Studies: Dg Chest Port 1 View  Result Date: 03/28/2016 CLINICAL DATA:  Shortness of breath.  Fever.  Lower extremity edema. EXAM: PORTABLE CHEST 1 VIEW COMPARISON:  04/16/2015 and 09/05/2013 FINDINGS: Stable mild cardiomegaly and pulmonary vascular congestion. No evidence of acute infiltrate or pleural effusion. Aortic atherosclerosis. IMPRESSION: Stable mild cardiomegaly and pulmonary vascular congestion. No acute findings. Electronically Signed   By: Lindamood Gell M.D.   On: 03/28/2016 14:09   Mm Digital Screening Bilateral  Result Date: 03/17/2016 CLINICAL DATA:  Screening. EXAM: DIGITAL SCREENING BILATERAL MAMMOGRAM WITH CAD COMPARISON:  Previous exam(s). ACR Breast Density Category b: There are scattered areas of fibroglandular density. FINDINGS: There are no findings suspicious for malignancy. Images were processed with CAD. IMPRESSION: No mammographic evidence of malignancy. A result letter of this screening mammogram will be mailed directly to the patient. RECOMMENDATION: Screening mammogram in one year. (Code:SM-B-01Y) BI-RADS CATEGORY  1: Negative. Electronically Signed   By: Franki Cabot M.D.   On: 03/17/2016 14:12    Microbiology: Recent Results (from the past 240 hour(s))  Blood culture (routine x 2)     Status: None (Preliminary result)   Collection Time: 03/28/16  1:15 PM  Result Value Ref Range Status   Specimen Description BLOOD RIGHT ANTECUBITAL  Final   Special Requests BOTTLES DRAWN AEROBIC AND ANAEROBIC 5CC  Final   Culture   Final    NO GROWTH 4 DAYS Performed at [facility], [place] [address], [place], [place] [address]    Report Status PENDING  Incomplete  Blood culture (routine x 2)     Status: None (Preliminary result)   Collection Time: 03/28/16  1:40 PM  Result Value Ref Range Status   Specimen Description BLOOD LEFT ANTECUBITAL  Final   Special Requests NONE  Final   Culture   Final    NO GROWTH 4 DAYS Performed at  [facility], [place] [address], [address] [place] [address], [place] [address]    Report Status PENDING  Incomplete     Labs: Basic Metabolic Panel:  Recent Labs Lab 03/29/16 0112 03/30/16 0441 03/31/16 0453 04/01/16 0500 04/02/16 0510  NA 143 142 143 144 142  K 3.0* 3.0* 3.3* 3.4* 3.2*  CL 106 104 104 103 103  CO2 29 31 32 35* 34*  GLUCOSE 129* 93 97 87 86  BUN 33* 24* 19 15 13   CREATININE 1.77* 1.30* 1.16* 1.12* 1.00  CALCIUM 8.5* 8.2* 8.3* 8.2* 8.5*  MG  --  2.1  --   --   --    Liver Function Tests:  Recent Labs Lab 03/28/16 1313  AST 44*  ALT 31  ALKPHOS 76  BILITOT 1.0  PROT 6.3*  ALBUMIN 3.1*   No results for input(s): LIPASE, AMYLASE in the last 168 hours. No results for input(s): AMMONIA in the last 168 hours. CBC:  Recent Labs Lab 03/28/16 1313 03/28/16 2228 03/29/16 0112 03/31/16 0453  WBC 8.5 7.6 7.0 4.6  NEUTROABS 7.1  --   --   --   HGB 11.8* 10.6* 10.2* 9.9*  HCT 37.2 33.6* 32.4* 32.3*  MCV 91.0 91.6 91.5 93.9  PLT 109* 86* 79* 92*   Cardiac Enzymes: No results for input(s): CKTOTAL, CKMB, CKMBINDEX, TROPONINI in the last 168 hours. BNP: BNP (last 3 results)  Recent Labs  04/11/15 1606 03/28/16 1313  BNP 75.2 560.9*    ProBNP (last 3 results)  Recent Labs  08/08/15 0953  PROBNP 48.0    CBG:  Recent Labs Lab 04/01/16 0725 04/01/16 1224 04/01/16 1710 04/01/16 2144 04/02/16 0730  GLUCAP 93 99 96 100* 93       Signed:  Nita Sells MD   [facility] 04/02/2016, 10:18 AM

## 2016-04-02 NOTE — Clinical Social Work Placement (Addendum)
Patient is set to discharge to [facility] today. Patient made aware & voicemail left for patient's husband, [name] [phone]). Discharge packet given to RN, Catie. [facility] called for transport to pickup at 2:00pm.     Raynaldo Opitz, [place] Social Worker cell #: [phone]    CLINICAL SOCIAL WORK PLACEMENT  NOTE  Date:  04/02/2016  Patient Details  Name: April [name] MRN: [id number] Date of Birth: Jun 08, 1950  Clinical Social Work is seeking post-discharge placement for this patient at the [place] level of care (*CSW will initial, date and re-position this form in  chart as items are completed):  Yes   Patient/family provided with [facility] Work Department's list of facilities offering this level of care within the geographic area requested by the patient (or if unable, by the patient's family).  Yes   Patient/family informed of their freedom to choose among providers that offer the needed level of care, that participate in Medicare, Medicaid or managed care program needed by the patient, have an available bed and are willing to accept the patient.  Yes   Patient/family informed of 's ownership interest in [facility] and [facility], as well as of the fact that they are under no obligation to receive care at these facilities.  PASRR submitted to EDS on       PASRR number received on       Existing PASRR number confirmed on 03/30/16     FL2 transmitted to all facilities in geographic area requested by pt/family on 03/30/16     FL2 transmitted to all facilities within larger geographic area on 03/30/16     Patient informed that his/her managed care company has contracts with or will negotiate with certain facilities, including the following:        Yes   Patient/family informed of bed offers received.  Patient chooses bed at [facility]     Physician recommends  and patient chooses bed at      Patient to be transferred to [facility] on 04/02/16.  Patient to be transferred to facility by [facility]     Patient family notified on 04/02/16 of transfer.  Name of family member notified:  patient's husband, Dwight     PHYSICIAN       Additional Comment:    _______________________________________________ Standley Brooking, LCSW 04/02/2016, 12:51 PM

## 2016-04-02 NOTE — Discharge Summary (Signed)
Physician Discharge Summary  April [name] OVZ:[id number] DOB: 02-Sep-1950 DOA: 03/28/2016  PCP: [name], MD  Admit date: 03/28/2016 Discharge date: 04/02/2016  Time spent: 30 minutes  Recommendations for Outpatient Follow-up:  1. Recommend that the patient have Chem-7 and CBC in 1 week post discharge 2. [facility] and therapy as much as possible to at skilled facility-patient has been counseled about increasing activity and using resources available at skilled facility to have her optimize her care 3. Patient should have a follow-up as it is about 1 month's time with her neurologist Dr. Simon Rhein having side effects from Lyrica and Topamax and have decreased Lyrica from her prior dose to current dose 4. (Daily standing weights 5. Recommend dressings as per below based on nurse instructions--patient should at [facility]., Thursday March 8 as she does see someone at that facility 6. Will recommend bariatric low air loss bed and floating the heels and using intertribal foreskin fold moisture accumulation 7. The patient didn't desaturate to 83% while sleeping and will qualify for oxygen at least at nighttime for severe sleep apnea-she needs testing as an outpatient with pulmonology or neurology for sleep apnea as this is the cause of her heart failure  Discharge Diagnoses:  Principal Problem:   Acute diastolic CHF (congestive heart failure) ([place]) Active Problems:   Essential hypertension   OSA (obstructive sleep apnea)   Morbid obesity with BMI of 50.0-59.9, adult (HCC)   Diabetes mellitus with neurological manifestations (HCC)   CKD (chronic kidney disease) stage 3, GFR 30-59 ml/min   Cellulitis and abscess of foot   AKI (acute kidney injury) ([place])   Discharge Condition: Improved  Diet recommendation: Heart healthy diabetic  Filed Weights   04/01/16 0532 04/01/16 1151 04/02/16 0520  Weight: (!) 155 kg (341 lb 11.4 oz) (!) 156.4 kg (344 lb 12.8 oz) (!) 150 kg  (330 lb 11 oz)    History of present illness:  65 ? PMH of asthma,  CAD-heart cath June 2008 nonobstructive disease, ^ LVEDP 37, V/Q negative Prior chronic diastolic CHF-used to see Dr. Tempie Hoist in the past unclear as to why DM 2 with severe peripheral neuropathy on Cymbalta 60, Lyrica 100 3 times a day under the direction of neurologist Dr. [name] Chronic osteoarthritis status post injection in left knee and prior injections and right knee as well as debilitating arthritis of upper extremities  HLD,  History of hyperplastic polyps on colonoscopy 2012 HTN,  chronic bilateral lower extremity wounds/possible venous stasis ulcers being assisted by home health RN,  morbid obesity,  OSA not on CPAP  lives with her spouse and son, moves around at home with the assistance of a cane/walker/wheelchair,   not been moving around much in the last 2 weeks, , presented to ED with few days' history of worsening leg swelling, home health RN noted increased drainage from leg wounds, worsening dyspnea.  Hospital Course:  Acute on chronic diastolic CHF Component of pulm hypertension Previously seen by cardiology  - 2-D echo 04/13/15: LVEF 65-70 percent and grade 1 diastolic dysfunction. Initiated on IV Lasix 60 mg every 12 hours. - 10 L L since admission. - Initially placed on IV Lasix 80 twice a day as well as Aldactone 25 and was transitioned to 60 twice a day of Lasix and on discharge home was transitioned to 80 twice a day which is her home dose Discontinue Foley at discharge to facilitate and encourage her to mobilize  Bilateral lower extremity ulcers -  venous stasis ulcers but etiology of others not clear.? Pressure wounds related to immobility and morbid obesity. Excruciatingly painful. Clinically do not appear infected. [facility] RN consultation 2/25 appreciated and continue management per their recommendations. - Judicious use of opioid pain medications for comfort.-  air overlay mattress provided at the  hospital and will need it at the facility--have emphasized to the patient will need to mobilize and get out of bed, but currently only able to get up and around with hoyer lift  Morbid obesity/Body mass index is 56.86 kg/m. - Eventually will need weight loss but not sure if she will be able to.? Candidate for bariatric surgery. Outpatient follow-up.  Essential hypertension Controlled. Only on diuretics at this time.  Verapamil 120 held and will be discontinued moving toward as an out agent ACEI held secondary to renal insufficiency, hydralazine 50 every 8 hours held, Coreg 35 twice a day resumed 2/28  Resume when able as an outpatient if felt needed however not felt necessary Hypotension overnight night of admission which has since resolved.  OSA, not on CPAP As per pulmonology follow-up in 2015, mild OSA with plans to continue conservative management and then follow-up but patient never did.  Outpatient pulmonology follow-up. CPAP being used in the hospital. Nighttime desaturation screen showed her oxygen went to 80 percent She will need to be worked up as an outpatient or CPAP once again  Type II DM with peripheral neuropathy A1c November 5 0.5. Follow repeat A1c. Continue NovoLog SSI. Blood sugar trending 93-117 Continue topiramate and Lyrica-will also need follow-up with her neurologist regarding this Patient stated she had some blurred vision and shakiness and we cut lyrica dose on discharge and she will need follow-up with her neurologist regarding this  Acute on stage III chronic kidney disease Baseline creatinine: 1.2. Presented with creatinine of 2.08.  This may be multifactorial related to poor perfusion from decompensated CHF and ACEI-held. Follow BMP closely. Avoid nephrotoxic.  Improved to 1.3. And has stabilized in that range  Elevated lactate Not clinically septic. No IV fluids due to volume overload. Has improved from 3.84 >  2.4.  Anemia Stable  Thrombocytopenia: : Unclear etiology. Follow CBCs. No bleeding reported.  Asthma Although patient reported some wheezing this morning, no overt bronchospasm on exam.     Discharge Exam: Vitals:   04/01/16 2146 04/02/16 0520  BP: 130/60 139/60  Pulse: 65 67  Resp: 18 18  Temp: 98.3 F (36.8 C) 98.6 F (37 C)    General: eomi ncat obese pleasant in nad Cardiovascular:  s1 s2 no m/r/g Respiratory: clear no added sound  Discharge Instructions   Discharge Instructions    Diet - low sodium heart healthy    Complete by:  As directed    Increase activity slowly    Complete by:  As directed      Current Discharge Medication List    START taking these medications   Details  atorvastatin (LIPITOR) 20 MG tablet Take 1 tablet (20 mg total) by mouth daily. Qty: 30 tablet, Refills: 0      CONTINUE these medications which have CHANGED   Details  pregabalin (LYRICA) 100 MG capsule Take 1 capsule (100 mg total) by mouth 2 (two) times daily. Qty: 90 capsule, Refills: 5      CONTINUE these medications which have NOT CHANGED   Details  albuterol (PROVENTIL HFA) 108 (90 BASE) MCG/ACT inhaler Inhale 2 puffs into the lungs every 4 (four) hours as needed for wheezing or  shortness of breath. Qty: 1 Inhaler, Refills: 4    aspirin (ECOTRIN LOW STRENGTH) 81 MG EC tablet Take 81 mg by mouth at bedtime.     budesonide-formoterol (SYMBICORT) 160-4.5 MCG/ACT inhaler one - two inhalations every 12 hours; gargle and spit after use Qty: 1 Inhaler, Refills: 4   Associated Diagnoses: Asthma exacerbation    carvedilol (COREG) 25 MG tablet Take 1 tablet (25 mg total) by mouth 2 (two) times daily with a meal. Qty: 60 tablet, Refills: 3    Cod Liver Oil CAPS Take 1 capsule by mouth daily.     cyanocobalamin (,VITAMIN B-12,) 1000 MCG/ML injection Inject 1 mL (1,000 mcg total) into the muscle every 30 (thirty) days. Qty: 2 mL, Refills: 1    !! docusate sodium  (COLACE) 100 MG capsule Take 2-3 capsules (200-300 mg total) by mouth daily. Qty: 90 capsule, Refills: 11    Flaxseed, Linseed, (FLAX SEED OIL) 1000 MG CAPS Take 1,000 mg by mouth 2 (two) times daily.     fluticasone (FLONASE) 50 MCG/ACT nasal spray Place 2 sprays into both nostrils daily. Qty: 16 g, Refills: 0   Associated Diagnoses: Acute URI    folic acid (FOLVITE) 734 MCG tablet Take 800 mcg by mouth at bedtime.     furosemide (LASIX) 40 MG tablet Take 2 tablets (80 mg total) by mouth 2 (two) times daily. Qty: 360 tablet, Refills: 5    Multiple Vitamin (MULTIVITAMIN) tablet Take 1 tablet by mouth daily.      Omega-3 Fatty Acids (FISH OIL) 1000 MG CAPS Take 1,000 mg by mouth daily.     potassium chloride SA (K-DUR,KLOR-CON) 20 MEQ tablet take 1 tablet by mouth once daily Qty: 90 tablet, Refills: 3    !! RA COL-RITE 100 MG capsule take 1 capsule by mouth twice a day if needed for MILD CONSTIPATION Qty: 60 capsule, Refills: 0    spironolactone (ALDACTONE) 25 MG tablet Take 1 tablet (25 mg total) by mouth daily. Qty: 30 tablet, Refills: 5    topiramate (TOPAMAX) 100 MG tablet Take 1 tablet (100 mg total) by mouth 2 (two) times daily. Qty: 60 tablet, Refills: 11    diclofenac sodium (VOLTAREN) 1 % GEL Apply 2 g topically 4 (four) times daily. Qty: 100 g, Refills: 11    DULoxetine (CYMBALTA) 60 MG capsule Take 1 capsule (60 mg total) by mouth daily. Qty: 30 capsule, Refills: 12    Lancets (ONETOUCH ULTRASOFT) lancets Use to help check blood sugars twice a day Dx E11.9 Qty: 100 each, Refills: 3    lidocaine (GLYDO) 2 % jelly Apply 2 grams to affected area, if needed. Qty: 30 mL, Refills: 11    NEEDLE, DISP, 23 G (EASY TOUCH FLIPLOCK NEEDLES) 23G X 1-1/2" MISC Use to inject B12 once monthly. Qty: 2 each, Refills: 0    omeprazole (PRILOSEC) 40 MG capsule Take 1 capsule (40 mg total) by mouth 2 (two) times daily. Qty: 60 capsule, Refills: 6    SYRINGE-NEEDLE, DISP, 3 ML 23G  X 1" 3 ML MISC Use with B12 to inject IM every 30 days Qty: 50 each, Refills: 0     !! - Potential duplicate medications found. Please discuss with provider.    STOP taking these medications     hydrALAZINE (APRESOLINE) 50 MG tablet      simvastatin (ZOCOR) 40 MG tablet      verapamil (VERELAN PM) 120 MG 24 hr capsule      glucose blood (ONE TOUCH  ULTRA TEST) test strip       : Allergies  Allergen Reactions  . Penicillins Rash    She was told not to take it anymore. Has patient had a PCN reaction causing immediate rash, facial/tongue/throat swelling, SOB or lightheadedness with hypotension: Yes Has patient had a PCN reaction causing severe rash involving mucus membranes or skin necrosis: No Has patient had a PCN reaction that required hospitalization: No Has patient had a PCN reaction occurring within the last 10 years: No If all of the above answers are "NO", then may proceed with Cephalosporin use.'  . Sulfonamide Derivatives Anaphylaxis    REACTION: internal "burning"  . Benazepril Hcl Other (See Comments)    Patient unsure of reaction.  . Hydrochlorothiazide W-Triamterene Other (See Comments)    Hypokalemia  . Metronidazole Other (See Comments)    Patient unsure of reaction.  Marland Kitchen Spironolactone     "kidney problems"  . Torsemide Other (See Comments)    Patient unsure of reaction.  . Valsartan Other (See Comments)    Patient unsure of reaction.   Contact information for after-discharge care    [facility] .   Specialty:  [place] information: [address] [place] [phone]               The results of significant diagnostics from this hospitalization (including imaging, microbiology, ancillary and laboratory) are listed below for reference.    Significant Diagnostic Studies: Dg Chest Port 1 View  Result Date: 03/28/2016 CLINICAL DATA:  Shortness of breath.  Fever.   Lower extremity edema. EXAM: PORTABLE CHEST 1 VIEW COMPARISON:  04/16/2015 and 09/05/2013 FINDINGS: Stable mild cardiomegaly and pulmonary vascular congestion. No evidence of acute infiltrate or pleural effusion. Aortic atherosclerosis. IMPRESSION: Stable mild cardiomegaly and pulmonary vascular congestion. No acute findings. Electronically Signed   By: Vandervoort Gell M.D.   On: 03/28/2016 14:09   Mm Digital Screening Bilateral  Result Date: 03/17/2016 CLINICAL DATA:  Screening. EXAM: DIGITAL SCREENING BILATERAL MAMMOGRAM WITH CAD COMPARISON:  Previous exam(s). ACR Breast Density Category b: There are scattered areas of fibroglandular density. FINDINGS: There are no findings suspicious for malignancy. Images were processed with CAD. IMPRESSION: No mammographic evidence of malignancy. A result letter of this screening mammogram will be mailed directly to the patient. RECOMMENDATION: Screening mammogram in one year. (Code:SM-B-01Y) BI-RADS CATEGORY  1: Negative. Electronically Signed   By: Franki Cabot M.D.   On: 03/17/2016 14:12    Microbiology: Recent Results (from the past 240 hour(s))  Blood culture (routine x 2)     Status: None (Preliminary result)   Collection Time: 03/28/16  1:15 PM  Result Value Ref Range Status   Specimen Description BLOOD RIGHT ANTECUBITAL  Final   Special Requests BOTTLES DRAWN AEROBIC AND ANAEROBIC 5CC  Final   Culture   Final    NO GROWTH 4 DAYS Performed at [facility], [place] [address], [place], [place] [address]    Report Status PENDING  Incomplete  Blood culture (routine x 2)     Status: None (Preliminary result)   Collection Time: 03/28/16  1:40 PM  Result Value Ref Range Status   Specimen Description BLOOD LEFT ANTECUBITAL  Final   Special Requests NONE  Final   Culture   Final    NO GROWTH 4 DAYS Performed at [facility], [place] [address] [place], [place] [address]    Report Status PENDING  Incomplete     Labs: Basic Metabolic  Panel:  Recent Labs Lab 03/29/16 0112 03/30/16 0441 03/31/16 0453 04/01/16 0500 04/02/16 0510  NA 143 142 143 144 142  K 3.0* 3.0* 3.3* 3.4* 3.2*  CL 106 104 104 103 103  CO2 29 31 32 35* 34*  GLUCOSE 129* 93 97 87 86  BUN 33* 24* 19 15 13   CREATININE 1.77* 1.30* 1.16* 1.12* 1.00  CALCIUM 8.5* 8.2* 8.3* 8.2* 8.5*  MG  --  2.1  --   --   --    Liver Function Tests:  Recent Labs Lab 03/28/16 1313  AST 44*  ALT 31  ALKPHOS 76  BILITOT 1.0  PROT 6.3*  ALBUMIN 3.1*   No results for input(s): LIPASE, AMYLASE in the last 168 hours. No results for input(s): AMMONIA in the last 168 hours. CBC:  Recent Labs Lab 03/28/16 1313 03/28/16 2228 03/29/16 0112 03/31/16 0453  WBC 8.5 7.6 7.0 4.6  NEUTROABS 7.1  --   --   --   HGB 11.8* 10.6* 10.2* 9.9*  HCT 37.2 33.6* 32.4* 32.3*  MCV 91.0 91.6 91.5 93.9  PLT 109* 86* 79* 92*   Cardiac Enzymes: No results for input(s): CKTOTAL, CKMB, CKMBINDEX, TROPONINI in the last 168 hours. BNP: BNP (last 3 results)  Recent Labs  04/11/15 1606 03/28/16 1313  BNP 75.2 560.9*    ProBNP (last 3 results)  Recent Labs  08/08/15 0953  PROBNP 48.0    CBG:  Recent Labs Lab 04/01/16 0725 04/01/16 1224 04/01/16 1710 04/01/16 2144 04/02/16 0730  GLUCAP 93 99 96 100* 93       Signed:  Nita Sells MD   [facility] 04/02/2016, 10:27 AM

## 2016-04-02 NOTE — Progress Notes (Signed)
Report called to Milus Banister at [facility]. Patient will be transported via [facility]. Patient aware.

## 2016-04-02 NOTE — Progress Notes (Signed)
Attempted to weigh pt using hoyer lift. Pt refused and insisted we use the scale on the bed. Pt educated on the importance of obtaining accurate daily weights. Will continue to monitor.

## 2016-04-02 NOTE — Progress Notes (Signed)
Pt placed on continuous pulse oximetry. While asleep, pt desaturated to 83% on room air. Pt was placed on 1L of oxygen and maintained a saturation of 93-95% throughout the shift. Will continue to monitor.

## 2016-04-03 ENCOUNTER — Telehealth: Payer: Self-pay | Admitting: *Deleted

## 2016-04-03 NOTE — Telephone Encounter (Signed)
Pt was on TCM list admitted 03/28/16 for Acute diastolic CHF (congestive heart failure). Pt was D/C 04/02/16 and sen to SNF. D/C states will need to see PCP 1 week after being release from SNF...April [name]

## 2016-04-06 ENCOUNTER — Ambulatory Visit: Payer: Medicare Other

## 2016-04-09 ENCOUNTER — Encounter (HOSPITAL_BASED_OUTPATIENT_CLINIC_OR_DEPARTMENT_OTHER): Payer: Medicare Other | Attending: Internal Medicine

## 2016-04-21 ENCOUNTER — Ambulatory Visit: Payer: Medicare Other | Admitting: Sports Medicine

## 2016-05-05 ENCOUNTER — Ambulatory Visit (INDEPENDENT_AMBULATORY_CARE_PROVIDER_SITE_OTHER): Payer: Medicare Other | Admitting: Sports Medicine

## 2016-05-05 ENCOUNTER — Encounter: Payer: Self-pay | Admitting: Sports Medicine

## 2016-05-05 ENCOUNTER — Telehealth: Payer: Self-pay | Admitting: Internal Medicine

## 2016-05-05 DIAGNOSIS — M79674 Pain in right toe(s): Secondary | ICD-10-CM

## 2016-05-05 DIAGNOSIS — M79675 Pain in left toe(s): Secondary | ICD-10-CM

## 2016-05-05 DIAGNOSIS — E1142 Type 2 diabetes mellitus with diabetic polyneuropathy: Secondary | ICD-10-CM

## 2016-05-05 DIAGNOSIS — M2142 Flat foot [pes planus] (acquired), left foot: Secondary | ICD-10-CM

## 2016-05-05 DIAGNOSIS — I890 Lymphedema, not elsewhere classified: Secondary | ICD-10-CM

## 2016-05-05 DIAGNOSIS — B351 Tinea unguium: Secondary | ICD-10-CM | POA: Diagnosis not present

## 2016-05-05 DIAGNOSIS — M2141 Flat foot [pes planus] (acquired), right foot: Secondary | ICD-10-CM

## 2016-05-05 NOTE — Progress Notes (Signed)
Subjective: April [name] is a 66 y.o. female patient with history of diabetes who returns to office today complaining of long, painful nails while ambulating in shoes; unable to trim. Patient states that the glucose reading this morning was ok recently discharged from inpatient rehab for leg ulcers. Patient denies any new changes in medication or new problems. Patient denies any new cramping, numbness, burning or tingling in the legs. Patient is awaiting diabetic shoes.   Patient Active Problem List   Diagnosis Date Noted  . Acute diastolic CHF (congestive heart failure) ([place]) 03/28/2016  . Cellulitis and abscess of foot 03/28/2016  . AKI (acute kidney injury) ([place]) 03/28/2016  . Whole body pain 03/20/2016  . Peripheral polyneuropathy ([place]) 03/20/2016  . Diabetic peripheral neuropathy ([place]) 03/20/2016  . Osteopenia 01/11/2016  . CKD (chronic kidney disease) stage 3, GFR 30-59 ml/min 01/02/2016  . Infected blister of right leg 01/02/2016  . Hand paresthesia 07/18/2015  . Carpal tunnel syndrome 06/07/2015  . Family history of colon cancer   . Diabetes mellitus with neurological manifestations ([place]) 04/18/2015  . Bilateral leg edema 04/11/2015  . Cellulitis of leg, right 04/11/2015  . Abnormality of gait 01/03/2015  . Hereditary and idiopathic peripheral neuropathy 01/03/2015  . GERD (gastroesophageal reflux disease) 06/17/2014  . Morbid obesity with BMI of 50.0-59.9, adult ([place]) 09/05/2013  . Hx of colonic polyps 12/15/2012  . Vitamin B12 deficiency 11/03/2012  . Intrinsic asthma 03/23/2012  . Chronic diastolic heart failure ([place]) 02/20/2011  . OSA (obstructive sleep apnea) 09/17/2010  . URINARY URGENCY 01/08/2010  . CAD, NATIVE VESSEL 11/20/2008  . OSTEOARTHRITIS, KNEES, BILATERAL, SEVERE 06/14/2008  . Hyperlipidemia 05/10/2007  . Essential hypertension 01/18/2007  . HYPOKALEMIA 04/30/2006  . HX, PERSONAL, PEPTIC ULCER DISEASE 04/30/2006   Current Outpatient Prescriptions on  File Prior to Visit  Medication Sig Dispense Refill  . aspirin (ECOTRIN LOW STRENGTH) 81 MG EC tablet Take 81 mg by mouth at bedtime.     Marland Kitchen atorvastatin (LIPITOR) 20 MG tablet Take 1 tablet (20 mg total) by mouth daily. 30 tablet 0  . budesonide-formoterol (SYMBICORT) 160-4.5 MCG/ACT inhaler one - two inhalations every 12 hours; gargle and spit after use (Patient taking differently: Inhale 2 puffs into the lungs 2 (two) times daily as needed (shortness of breath). gargle and spit after use) 1 Inhaler 4  . carvedilol (COREG) 25 MG tablet Take 1 tablet (25 mg total) by mouth 2 (two) times daily with a meal. 60 tablet 3  . Cod Liver Oil CAPS Take 1 capsule by mouth daily.     . cyanocobalamin (,VITAMIN B-12,) 1000 MCG/ML injection Inject 1 mL (1,000 mcg total) into the muscle every 30 (thirty) days. 2 mL 1  . diclofenac sodium (VOLTAREN) 1 % GEL Apply 2 g topically 4 (four) times daily. 100 g 11  . docusate sodium (COLACE) 100 MG capsule Take 2-3 capsules (200-300 mg total) by mouth daily. 90 capsule 11  . DULoxetine (CYMBALTA) 60 MG capsule Take 1 capsule (60 mg total) by mouth daily. 30 capsule 12  . Flaxseed, Linseed, (FLAX SEED OIL) 1000 MG CAPS Take 1,000 mg by mouth 2 (two) times daily.     . fluticasone (FLONASE) 50 MCG/ACT nasal spray Place 2 sprays into both nostrils daily. 16 g 0  . folic acid (FOLVITE) 976 MCG tablet Take 800 mcg by mouth at bedtime.     . furosemide (LASIX) 40 MG tablet Take 2 tablets (80 mg total) by mouth 2 (two) times daily.  360 tablet 5  . Lancets (ONETOUCH ULTRASOFT) lancets Use to help check blood sugars twice a day Dx E11.9 100 each 3  . lidocaine (GLYDO) 2 % jelly Apply 2 grams to affected area, if needed. 30 mL 11  . Multiple Vitamin (MULTIVITAMIN) tablet Take 1 tablet by mouth daily.      Marland Kitchen NEEDLE, DISP, 23 G (EASY TOUCH FLIPLOCK NEEDLES) 23G X 1-1/2" MISC Use to inject B12 once monthly. 2 each 0  . Omega-3 Fatty Acids (FISH OIL) 1000 MG CAPS Take 1,000 mg by  mouth daily.     Marland Kitchen omeprazole (PRILOSEC) 40 MG capsule Take 1 capsule (40 mg total) by mouth 2 (two) times daily. 60 capsule 6  . potassium chloride SA (K-DUR,KLOR-CON) 20 MEQ tablet take 1 tablet by mouth once daily 90 tablet 3  . pregabalin (LYRICA) 100 MG capsule Take 1 capsule (100 mg total) by mouth 2 (two) times daily. 90 capsule 5  . RA COL-RITE 100 MG capsule take 1 capsule by mouth twice a day if needed for MILD CONSTIPATION 60 capsule 0  . spironolactone (ALDACTONE) 25 MG tablet Take 1 tablet (25 mg total) by mouth daily. 30 tablet 5  . SYRINGE-NEEDLE, DISP, 3 ML 23G X 1" 3 ML MISC Use with B12 to inject IM every 30 days 50 each 0  . topiramate (TOPAMAX) 100 MG tablet Take 1 tablet (100 mg total) by mouth 2 (two) times daily. 60 tablet 11  . albuterol (PROVENTIL HFA) 108 (90 BASE) MCG/ACT inhaler Inhale 2 puffs into the lungs every 4 (four) hours as needed for wheezing or shortness of breath. 1 Inhaler 4   No current facility-administered medications on file prior to visit.    Allergies  Allergen Reactions  . Penicillins Rash    She was told not to take it anymore. Has patient had a PCN reaction causing immediate rash, facial/tongue/throat swelling, SOB or lightheadedness with hypotension: Yes Has patient had a PCN reaction causing severe rash involving mucus membranes or skin necrosis: No Has patient had a PCN reaction that required hospitalization: No Has patient had a PCN reaction occurring within the last 10 years: No If all of the above answers are "NO", then may proceed with Cephalosporin use.'  . Sulfonamide Derivatives Anaphylaxis    REACTION: internal "burning"  . Benazepril Hcl Other (See Comments)    Patient unsure of reaction.  . Hydrochlorothiazide W-Triamterene Other (See Comments)    Hypokalemia  . Metronidazole Other (See Comments)    Patient unsure of reaction.  Marland Kitchen Spironolactone     "kidney problems"  . Torsemide Other (See Comments)    Patient unsure of  reaction.  . Valsartan Other (See Comments)    Patient unsure of reaction.    Recent Results (from the past 2160 hour(s))  CBC with Differential/Platelet     Status: Abnormal   Collection Time: 03/28/16  1:13 PM  Result Value Ref Range   WBC 8.5 4.0 - 10.5 K/uL   RBC 4.09 3.87 - 5.11 MIL/uL   Hemoglobin 11.8 (L) 12.0 - 15.0 g/dL   HCT 37.2 36.0 - 46.0 %   MCV 91.0 78.0 - 100.0 fL   MCH 28.9 26.0 - 34.0 pg   MCHC 31.7 30.0 - 36.0 g/dL   RDW 15.1 11.5 - 15.5 %   Platelets 109 (L) 150 - 400 K/uL    Comment: RESULT REPEATED AND VERIFIED SPECIMEN CHECKED FOR CLOTS PLATELET COUNT CONFIRMED BY SMEAR    Neutrophils Relative %  83 %   Lymphocytes Relative 11 %   Monocytes Relative 6 %   Eosinophils Relative 0 %   Basophils Relative 0 %   Neutro Abs 7.1 1.7 - 7.7 K/uL   Lymphs Abs 0.9 0.7 - 4.0 K/uL   Monocytes Absolute 0.5 0.1 - 1.0 K/uL   Eosinophils Absolute 0.0 0.0 - 0.7 K/uL   Basophils Absolute 0.0 0.0 - 0.1 K/uL   Smear Review MORPHOLOGY UNREMARKABLE   Comprehensive metabolic panel     Status: Abnormal   Collection Time: 03/28/16  1:13 PM  Result Value Ref Range   Sodium 139 135 - 145 mmol/L   Potassium 3.6 3.5 - 5.1 mmol/L   Chloride 101 101 - 111 mmol/L   CO2 26 22 - 32 mmol/L   Glucose, Bld 156 (H) 65 - 99 mg/dL   BUN 37 (H) 6 - 20 mg/dL   Creatinine, Ser 2.08 (H) 0.44 - 1.00 mg/dL   Calcium 8.8 (L) 8.9 - 10.3 mg/dL   Total Protein 6.3 (L) 6.5 - 8.1 g/dL   Albumin 3.1 (L) 3.5 - 5.0 g/dL   AST 44 (H) 15 - 41 U/L   ALT 31 14 - 54 U/L   Alkaline Phosphatase 76 38 - 126 U/L   Total Bilirubin 1.0 0.3 - 1.2 mg/dL   GFR calc non Af Amer 24 (L) >60 mL/min   GFR calc Af Amer 28 (L) >60 mL/min    Comment: (NOTE) The eGFR has been calculated using the CKD EPI equation. This calculation has not been validated in all clinical situations. eGFR's persistently <60 mL/min signify possible Chronic Kidney Disease.    Anion gap 12 5 - 15  Brain natriuretic peptide     Status:  Abnormal   Collection Time: 03/28/16  1:13 PM  Result Value Ref Range   B Natriuretic Peptide 560.9 (H) 0.0 - 100.0 pg/mL  Blood culture (routine x 2)     Status: None   Collection Time: 03/28/16  1:15 PM  Result Value Ref Range   Specimen Description BLOOD RIGHT ANTECUBITAL    Special Requests BOTTLES DRAWN AEROBIC AND ANAEROBIC 5CC    Culture      NO GROWTH 5 DAYS Performed at [facility], [place] [address], [place], [place] [address]    Report Status 04/02/2016 FINAL   I-stat troponin, ED     Status: None   Collection Time: 03/28/16  1:23 PM  Result Value Ref Range   Troponin i, poc 0.03 0.00 - 0.08 ng/mL   Comment 3            Comment: Due to the release kinetics of cTnI, a negative result within the first hours of the onset of symptoms does not rule out myocardial infarction with certainty. If myocardial infarction is still suspected, repeat the test at appropriate intervals.   I-Stat CG4 Lactic Acid, ED     Status: Abnormal   Collection Time: 03/28/16  1:24 PM  Result Value Ref Range   Lactic Acid, Venous 3.84 (HH) 0.5 - 1.9 mmol/L   Comment NOTIFIED PHYSICIAN   Blood culture (routine x 2)     Status: None   Collection Time: 03/28/16  1:40 PM  Result Value Ref Range   Specimen Description BLOOD LEFT ANTECUBITAL    Special Requests NONE    Culture      NO GROWTH 5 DAYS Performed at [facility], [place] [address] [place], [place] [address]    Report  Status 04/02/2016 FINAL   Glucose, capillary     Status: None   Collection Time: 03/28/16  8:51 PM  Result Value Ref Range   Glucose-Capillary 99 65 - 99 mg/dL  Blood gas, arterial     Status: Abnormal   Collection Time: 03/28/16 10:08 PM  Result Value Ref Range   O2 Content 2.0 L/min   Delivery systems NASAL CANNULA    pH, Arterial 7.349 (L) 7.350 - 7.450   pCO2 arterial 57.6 (H) 32.0 - 48.0 mmHg    Comment: CRITICAL RESULT CALLED TO, READ BACK BY AND VERIFIED WITH: JESSICA ASARO, RN AT 2220 BY DOMINIQUE  MACKEY, RRT 03/28/2016    pO2, Arterial 72.2 (L) 83.0 - 108.0 mmHg   Bicarbonate 30.9 (H) 20.0 - 28.0 mmol/L   Acid-Base Excess 4.7 (H) 0.0 - 2.0 mmol/L   O2 Saturation 91.9 %   Patient temperature 98.6    Collection site LEFT RADIAL    Drawn by [id number]    Sample type ARTERIAL DRAW    Allens test (pass/fail) PASS PASS  CBC     Status: Abnormal   Collection Time: 03/28/16 10:28 PM  Result Value Ref Range   WBC 7.6 4.0 - 10.5 K/uL   RBC 3.67 (L) 3.87 - 5.11 MIL/uL   Hemoglobin 10.6 (L) 12.0 - 15.0 g/dL   HCT 33.6 (L) 36.0 - 46.0 %   MCV 91.6 78.0 - 100.0 fL   MCH 28.9 26.0 - 34.0 pg   MCHC 31.5 30.0 - 36.0 g/dL   RDW 15.2 11.5 - 15.5 %   Platelets 86 (L) 150 - 400 K/uL    Comment: CONSISTENT WITH PREVIOUS RESULT  Basic metabolic panel     Status: Abnormal   Collection Time: 03/28/16 10:28 PM  Result Value Ref Range   Sodium 144 135 - 145 mmol/L   Potassium 3.4 (L) 3.5 - 5.1 mmol/L   Chloride 105 101 - 111 mmol/L   CO2 31 22 - 32 mmol/L   Glucose, Bld 98 65 - 99 mg/dL   BUN 34 (H) 6 - 20 mg/dL   Creatinine, Ser 1.77 (H) 0.44 - 1.00 mg/dL   Calcium 8.6 (L) 8.9 - 10.3 mg/dL   GFR calc non Af Amer 29 (L) >60 mL/min   GFR calc Af Amer 34 (L) >60 mL/min    Comment: (NOTE) The eGFR has been calculated using the CKD EPI equation. This calculation has not been validated in all clinical situations. eGFR's persistently <60 mL/min signify possible Chronic Kidney Disease.    Anion gap 8 5 - 15  Lactic acid, plasma     Status: None   Collection Time: 03/28/16 10:36 PM  Result Value Ref Range   Lactic Acid, Venous 1.3 0.5 - 1.9 mmol/L  Basic metabolic panel     Status: Abnormal   Collection Time: 03/29/16  1:12 AM  Result Value Ref Range   Sodium 143 135 - 145 mmol/L   Potassium 3.0 (L) 3.5 - 5.1 mmol/L   Chloride 106 101 - 111 mmol/L   CO2 29 22 - 32 mmol/L   Glucose, Bld 129 (H) 65 - 99 mg/dL   BUN 33 (H) 6 - 20 mg/dL   Creatinine, Ser 1.77 (H) 0.44 - 1.00 mg/dL   Calcium 8.5  (L) 8.9 - 10.3 mg/dL   GFR calc non Af Amer 29 (L) >60 mL/min   GFR calc Af Amer 34 (L) >60 mL/min    Comment: (NOTE) The eGFR has been calculated  using the CKD EPI equation. This calculation has not been validated in all clinical situations. eGFR's persistently <60 mL/min signify possible Chronic Kidney Disease.    Anion gap 8 5 - 15  CBC     Status: Abnormal   Collection Time: 03/29/16  1:12 AM  Result Value Ref Range   WBC 7.0 4.0 - 10.5 K/uL   RBC 3.54 (L) 3.87 - 5.11 MIL/uL   Hemoglobin 10.2 (L) 12.0 - 15.0 g/dL   HCT 32.4 (L) 36.0 - 46.0 %   MCV 91.5 78.0 - 100.0 fL   MCH 28.8 26.0 - 34.0 pg   MCHC 31.5 30.0 - 36.0 g/dL   RDW 15.2 11.5 - 15.5 %   Platelets 79 (L) 150 - 400 K/uL    Comment: CONSISTENT WITH PREVIOUS RESULT  Lactic acid, plasma     Status: Abnormal   Collection Time: 03/29/16  1:18 AM  Result Value Ref Range   Lactic Acid, Venous 2.4 (HH) 0.5 - 1.9 mmol/L    Comment: CRITICAL RESULT CALLED TO, READ BACK BY AND VERIFIED WITH: JESS ASARO,RN [id number] @ 0205 BY J SCOTTON   Glucose, capillary     Status: None   Collection Time: 03/29/16  7:34 AM  Result Value Ref Range   Glucose-Capillary 94 65 - 99 mg/dL  Glucose, capillary     Status: Abnormal   Collection Time: 03/29/16 11:08 AM  Result Value Ref Range   Glucose-Capillary 121 (H) 65 - 99 mg/dL  Glucose, capillary     Status: Abnormal   Collection Time: 03/29/16  4:33 PM  Result Value Ref Range   Glucose-Capillary 112 (H) 65 - 99 mg/dL  Glucose, capillary     Status: Abnormal   Collection Time: 03/29/16  9:02 PM  Result Value Ref Range   Glucose-Capillary 100 (H) 65 - 99 mg/dL  Basic metabolic panel     Status: Abnormal   Collection Time: 03/30/16  4:41 AM  Result Value Ref Range   Sodium 142 135 - 145 mmol/L   Potassium 3.0 (L) 3.5 - 5.1 mmol/L   Chloride 104 101 - 111 mmol/L   CO2 31 22 - 32 mmol/L   Glucose, Bld 93 65 - 99 mg/dL   BUN 24 (H) 6 - 20 mg/dL   Creatinine, Ser 1.30 (H) 0.44 - 1.00  mg/dL   Calcium 8.2 (L) 8.9 - 10.3 mg/dL   GFR calc non Af Amer 42 (L) >60 mL/min   GFR calc Af Amer 49 (L) >60 mL/min    Comment: (NOTE) The eGFR has been calculated using the CKD EPI equation. This calculation has not been validated in all clinical situations. eGFR's persistently <60 mL/min signify possible Chronic Kidney Disease.    Anion gap 7 5 - 15  Magnesium     Status: None   Collection Time: 03/30/16  4:41 AM  Result Value Ref Range   Magnesium 2.1 1.7 - 2.4 mg/dL  Glucose, capillary     Status: None   Collection Time: 03/30/16  7:53 AM  Result Value Ref Range   Glucose-Capillary 92 65 - 99 mg/dL  Glucose, capillary     Status: Abnormal   Collection Time: 03/30/16 12:14 PM  Result Value Ref Range   Glucose-Capillary 167 (H) 65 - 99 mg/dL  Glucose, capillary     Status: Abnormal   Collection Time: 03/30/16  5:24 PM  Result Value Ref Range   Glucose-Capillary 105 (H) 65 - 99 mg/dL  Glucose, capillary  Status: None   Collection Time: 03/30/16  9:10 PM  Result Value Ref Range   Glucose-Capillary 98 65 - 99 mg/dL  Basic metabolic panel     Status: Abnormal   Collection Time: 03/31/16  4:53 AM  Result Value Ref Range   Sodium 143 135 - 145 mmol/L   Potassium 3.3 (L) 3.5 - 5.1 mmol/L   Chloride 104 101 - 111 mmol/L   CO2 32 22 - 32 mmol/L   Glucose, Bld 97 65 - 99 mg/dL   BUN 19 6 - 20 mg/dL   Creatinine, Ser 1.16 (H) 0.44 - 1.00 mg/dL   Calcium 8.3 (L) 8.9 - 10.3 mg/dL   GFR calc non Af Amer 48 (L) >60 mL/min   GFR calc Af Amer 56 (L) >60 mL/min    Comment: (NOTE) The eGFR has been calculated using the CKD EPI equation. This calculation has not been validated in all clinical situations. eGFR's persistently <60 mL/min signify possible Chronic Kidney Disease.    Anion gap 7 5 - 15  CBC     Status: Abnormal   Collection Time: 03/31/16  4:53 AM  Result Value Ref Range   WBC 4.6 4.0 - 10.5 K/uL   RBC 3.44 (L) 3.87 - 5.11 MIL/uL   Hemoglobin 9.9 (L) 12.0 - 15.0  g/dL   HCT 32.3 (L) 36.0 - 46.0 %   MCV 93.9 78.0 - 100.0 fL   MCH 28.8 26.0 - 34.0 pg   MCHC 30.7 30.0 - 36.0 g/dL   RDW 15.4 11.5 - 15.5 %   Platelets 92 (L) 150 - 400 K/uL    Comment: CONSISTENT WITH PREVIOUS RESULT  Glucose, capillary     Status: Abnormal   Collection Time: 03/31/16  7:43 AM  Result Value Ref Range   Glucose-Capillary 107 (H) 65 - 99 mg/dL  Glucose, capillary     Status: Abnormal   Collection Time: 03/31/16 11:37 AM  Result Value Ref Range   Glucose-Capillary 117 (H) 65 - 99 mg/dL  Glucose, capillary     Status: Abnormal   Collection Time: 03/31/16  4:59 PM  Result Value Ref Range   Glucose-Capillary 102 (H) 65 - 99 mg/dL  Glucose, capillary     Status: Abnormal   Collection Time: 03/31/16  9:45 PM  Result Value Ref Range   Glucose-Capillary 100 (H) 65 - 99 mg/dL  Basic metabolic panel     Status: Abnormal   Collection Time: 04/01/16  5:00 AM  Result Value Ref Range   Sodium 144 135 - 145 mmol/L   Potassium 3.4 (L) 3.5 - 5.1 mmol/L   Chloride 103 101 - 111 mmol/L   CO2 35 (H) 22 - 32 mmol/L   Glucose, Bld 87 65 - 99 mg/dL   BUN 15 6 - 20 mg/dL   Creatinine, Ser 1.12 (H) 0.44 - 1.00 mg/dL   Calcium 8.2 (L) 8.9 - 10.3 mg/dL   GFR calc non Af Amer 50 (L) >60 mL/min   GFR calc Af Amer 58 (L) >60 mL/min    Comment: (NOTE) The eGFR has been calculated using the CKD EPI equation. This calculation has not been validated in all clinical situations. eGFR's persistently <60 mL/min signify possible Chronic Kidney Disease.    Anion gap 6 5 - 15  Glucose, capillary     Status: None   Collection Time: 04/01/16  7:25 AM  Result Value Ref Range   Glucose-Capillary 93 65 - 99 mg/dL  Glucose, capillary  Status: None   Collection Time: 04/01/16 12:24 PM  Result Value Ref Range   Glucose-Capillary 99 65 - 99 mg/dL  Glucose, capillary     Status: None   Collection Time: 04/01/16  5:10 PM  Result Value Ref Range   Glucose-Capillary 96 65 - 99 mg/dL  Glucose,  capillary     Status: Abnormal   Collection Time: 04/01/16  9:44 PM  Result Value Ref Range   Glucose-Capillary 100 (H) 65 - 99 mg/dL  Basic metabolic panel     Status: Abnormal   Collection Time: 04/02/16  5:10 AM  Result Value Ref Range   Sodium 142 135 - 145 mmol/L   Potassium 3.2 (L) 3.5 - 5.1 mmol/L   Chloride 103 101 - 111 mmol/L   CO2 34 (H) 22 - 32 mmol/L   Glucose, Bld 86 65 - 99 mg/dL   BUN 13 6 - 20 mg/dL   Creatinine, Ser 3.83 0.44 - 1.00 mg/dL   Calcium 8.5 (L) 8.9 - 10.3 mg/dL   GFR calc non Af Amer 58 (L) >60 mL/min   GFR calc Af Amer >60 >60 mL/min    Comment: (NOTE) The eGFR has been calculated using the CKD EPI equation. This calculation has not been validated in all clinical situations. eGFR's persistently <60 mL/min signify possible Chronic Kidney Disease.    Anion gap 5 5 - 15  Glucose, capillary     Status: None   Collection Time: 04/02/16  7:30 AM  Result Value Ref Range   Glucose-Capillary 93 65 - 99 mg/dL  Glucose, capillary     Status: Abnormal   Collection Time: 04/02/16 11:45 AM  Result Value Ref Range   Glucose-Capillary 113 (H) 65 - 99 mg/dL    Objective: General: Patient is awake, alert, and oriented x 3 and in no acute distress.  Integument: Skin is warm, dry and supple bilateral. Nails are tender, long, thickened and dystrophic with subungual debris, consistent with onychomycosis, 1-5 bilateral. No signs of infection. No open lesions or preulcerative lesions present bilateral. Unna boots on legs bilateral patient goes to wound care center. Callus to heels bilateral. Remaining integument unremarkable.  Vasculature:  Dorsalis Pedis pulse 0/4 bilateral. Posterior Tibial pulse  0/4 bilateral. Capillary fill time <5 sec 1-5 bilateral. No hair growth to the level of the digits.Temperature gradient within normal limits. No varicosities present bilateral. Chronic edema present bilateral.   Neurology: The patient has diminished sensation measured with  a 5.07/10g Semmes Weinstein Monofilament at all pedal sites bilateral . Vibratory sensation diminished bilateral with tuning fork. No Babinski sign present bilateral.   Musculoskeletal: Asymptomatic pes planus pedal deformities noted bilateral. Muscular strength 4/5 in all lower extremity muscular groups bilateral without pain on range of motion. No tenderness with calf compression bilateral.  Assessment and Plan: Problem List Items Addressed This Visit    None    Visit Diagnoses    Dermatophytosis of nail    -  Primary   Diabetic polyneuropathy associated with type 2 diabetes mellitus (HCC)       Toe pain, bilateral       Lymphedema       Pes planus of both feet         -Examined patient. -Discussed and educated patient on diabetic foot care, especially with  regards to the vascular, neurological and musculoskeletal systems.  -Stressed the importance of good glycemic control and the detriment of not  controlling glucose levels in relation to the foot. -Mechanically  debrided all nails 1-5 bilateral using sterile nail nipper and filed with dremel without incident  -Dry callus to heels; recommend continue with skin emollients  -Patient awaiting Diabetic shoes; Gave patient safe step papers for Dr. Quay Burow to sign. -Answered all patient questions -Patient to return  in 3 months for at risk foot care -Patient advised to call the office if any problems or questions arise in the meantime.  Landis Martins, DPM

## 2016-05-05 NOTE — Telephone Encounter (Signed)
Pt needs order for B12 from [facility]. Pt states she is due for one 05/08/16.  Fax:[phone]

## 2016-05-06 ENCOUNTER — Telehealth: Payer: Self-pay | Admitting: Internal Medicine

## 2016-05-06 MED ORDER — "SYRINGE/NEEDLE (DISP) 23G X 1"" 3 ML MISC": 0 refills | Status: DC

## 2016-05-06 MED ORDER — CYANOCOBALAMIN 1000 MCG/ML IJ SOLN: 1000.0000 ug | INTRAMUSCULAR | 2 refills | Status: DC

## 2016-05-06 NOTE — Telephone Encounter (Signed)
Spoke with pt. She just needed a refill sent to [facility]. RX sent.

## 2016-05-06 NOTE — Telephone Encounter (Signed)
Pt called to check to see if Dr Quay Burow has completed the form for the pt to get her diabetic shoes from podiatry. She said that she was there yesterday and they have not received it. If we do not have the form she can bring it by. She asked if you could call her today before 12 if possible (she has a dentist apt after that). Thanks!

## 2016-05-06 NOTE — Telephone Encounter (Signed)
Spoke with pt, she is going to bring the necessary paperwork by the office, bc it has not been received by [place].

## 2016-05-07 NOTE — Telephone Encounter (Signed)
Spoke with pt, states she would like to pick the paperwork up when it is completed. Informed her I will let her know when it is ready.

## 2016-05-07 NOTE — Telephone Encounter (Signed)
Pt wants to know the status of her foot and ankle paperwork dropped by yesterday.

## 2016-05-08 ENCOUNTER — Ambulatory Visit: Payer: Medicare Other

## 2016-05-08 NOTE — Telephone Encounter (Signed)
Pt notified paperwork is ready for pick-up.

## 2016-05-11 ENCOUNTER — Telehealth: Payer: Self-pay | Admitting: Internal Medicine

## 2016-05-11 NOTE — Telephone Encounter (Signed)
Requesting verbals for  1x1  2x3  1x1

## 2016-05-12 NOTE — Telephone Encounter (Signed)
Spoke with Will to give verbal orders for OT per MD.

## 2016-05-13 ENCOUNTER — Ambulatory Visit (INDEPENDENT_AMBULATORY_CARE_PROVIDER_SITE_OTHER): Payer: Medicare Other | Admitting: Internal Medicine

## 2016-05-13 ENCOUNTER — Other Ambulatory Visit (INDEPENDENT_AMBULATORY_CARE_PROVIDER_SITE_OTHER): Payer: Medicare Other

## 2016-05-13 ENCOUNTER — Encounter: Payer: Self-pay | Admitting: Internal Medicine

## 2016-05-13 VITALS — BP 136/78 | HR 80 | Temp 98.2°F | Ht 65.0 in | Wt 360.1 lb

## 2016-05-13 DIAGNOSIS — E1142 Type 2 diabetes mellitus with diabetic polyneuropathy: Secondary | ICD-10-CM

## 2016-05-13 DIAGNOSIS — L97909 Non-pressure chronic ulcer of unspecified part of unspecified lower leg with unspecified severity: Secondary | ICD-10-CM

## 2016-05-13 DIAGNOSIS — M25561 Pain in right knee: Secondary | ICD-10-CM | POA: Diagnosis not present

## 2016-05-13 DIAGNOSIS — I872 Venous insufficiency (chronic) (peripheral): Secondary | ICD-10-CM | POA: Diagnosis not present

## 2016-05-13 DIAGNOSIS — I10 Essential (primary) hypertension: Secondary | ICD-10-CM

## 2016-05-13 DIAGNOSIS — G8929 Other chronic pain: Secondary | ICD-10-CM | POA: Diagnosis not present

## 2016-05-13 DIAGNOSIS — N183 Chronic kidney disease, stage 3 unspecified: Secondary | ICD-10-CM

## 2016-05-13 DIAGNOSIS — I5032 Chronic diastolic (congestive) heart failure: Secondary | ICD-10-CM | POA: Diagnosis not present

## 2016-05-13 DIAGNOSIS — G4733 Obstructive sleep apnea (adult) (pediatric): Secondary | ICD-10-CM | POA: Diagnosis not present

## 2016-05-13 DIAGNOSIS — I83009 Varicose veins of unspecified lower extremity with ulcer of unspecified site: Secondary | ICD-10-CM | POA: Insufficient documentation

## 2016-05-13 DIAGNOSIS — M25562 Pain in left knee: Secondary | ICD-10-CM | POA: Diagnosis not present

## 2016-05-13 DIAGNOSIS — E1140 Type 2 diabetes mellitus with diabetic neuropathy, unspecified: Secondary | ICD-10-CM | POA: Diagnosis not present

## 2016-05-13 LAB — COMPREHENSIVE METABOLIC PANEL
ALK PHOS: 58 U/L (ref 39–117)
ALT: 11 U/L (ref 0–35)
AST: 16 U/L (ref 0–37)
Albumin: 3.8 g/dL (ref 3.5–5.2)
BILIRUBIN TOTAL: 0.5 mg/dL (ref 0.2–1.2)
BUN: 39 mg/dL — AB (ref 6–23)
CO2: 33 mEq/L — ABNORMAL HIGH (ref 19–32)
Calcium: 9.4 mg/dL (ref 8.4–10.5)
Chloride: 104 mEq/L (ref 96–112)
Creatinine, Ser: 1.17 mg/dL (ref 0.40–1.20)
GFR: 59.53 mL/min — ABNORMAL LOW (ref >60.00)
Glucose, Bld: 85 mg/dL (ref 70–99)
Potassium: 3.4 mEq/L — ABNORMAL LOW (ref 3.5–5.1)
SODIUM: 145 meq/L (ref 135–145)
TOTAL PROTEIN: 6.8 g/dL (ref 6.0–8.3)

## 2016-05-13 LAB — HEMOGLOBIN A1C: HEMOGLOBIN A1C: 5.6 % (ref 4.6–6.5)

## 2016-05-13 MED ORDER — PREGABALIN 50 MG PO CAPS: 50.0000 mg | ORAL_CAPSULE | Freq: Two times a day (BID) | ORAL | 3 refills | Status: DC

## 2016-05-13 MED ORDER — POTASSIUM CHLORIDE CRYS ER 20 MEQ PO TBCR: 40.0000 meq | EXTENDED_RELEASE_TABLET | Freq: Every day | ORAL | 3 refills | Status: DC

## 2016-05-13 MED ORDER — PRO-STAT SUGAR FREE PO LIQD: 30.0000 mL | Freq: Three times a day (TID) | ORAL | 3 refills | Status: DC

## 2016-05-13 MED ORDER — FUROSEMIDE 40 MG PO TABS: 80.0000 mg | ORAL_TABLET | Freq: Three times a day (TID) | ORAL | 5 refills | Status: DC

## 2016-05-13 NOTE — Patient Instructions (Addendum)
  Test(s) ordered today. Your results will be released to [place] (or called to you) after review, usually within 72hours after test completion. If any changes need to be made, you will be notified at that same time.   Medications reviewed and updated.  Changes include decreasing lyrica to 50 mg twice daily.  Prostat was sent to your pharmacy.   Your prescription(s) have been submitted to your pharmacy. Please take as directed and contact our office if you believe you are having problem(s) with the medication(s).  A referral for pulmonary was ordered  Please followup in 3 months

## 2016-05-13 NOTE — Progress Notes (Signed)
Pre visit review using our clinic review tool, if applicable. No additional management support is needed unless otherwise documented below in the visit note. 

## 2016-05-13 NOTE — Assessment & Plan Note (Signed)
cmp Will adjust lasix if decreased GFR

## 2016-05-13 NOTE — Progress Notes (Signed)
Subjective:    Patient ID: April [name], female    DOB: 1950/10/22, 66 y.o.   MRN: [id number]  HPI The patient is here for follow up.  She was admitted to the hospital 03/28/16 - 04/02/16 for acute diastolic heart failure.  She was discharged to rehab and came home on 3/30.    Acute on chronic diastolic CHF, pulm htn:  She was placed on IV lasix and diuresed resulting in a 10 lb weight loss. She was discharged on lasix 80 mg twice daily and this was increased at rehab and she is now taking 80 mg three times a day.  She feels her swelling is controlled.    Bilateral lower extremity ulcers/ venous stasis ulcers: There was no obvious infection.  Would care saw patient. She was put on opiods and had local wound care.  She was drinking prostat at rehab to help with healing her wounds.  She would like a refill.    Hypertension:  Some of her medications were held/changed.  They were to be resumed if needed.    OSA:  She was diagnosed in 2015 and plan was for conservative management.  She did not follow up with pulmonary.  Outpatient follow up needed. Nighttime desaturation went to 80%.    Type 2 DM:  Her sugars were controlled. She is not on any medication.   Neuropathy:  Her lyrica dose was lowered due to side effects.  She has neurology follow up.  She still has some shaking and has some memory issues or difficulty getting her words out.  She knows this is a side effect from the medication.  She wonders if she needs to stay on this.   Acute on chronic kidney disease:  ACE-I held.  Cr improved, but needs follow up.      Medications and allergies reviewed with patient and updated if appropriate.  Patient Active Problem List   Diagnosis Date Noted  . Acute diastolic CHF (congestive heart failure) ([place]) 03/28/2016  . Cellulitis and abscess of foot 03/28/2016  . AKI (acute kidney injury) ([place]) 03/28/2016  . Whole body pain 03/20/2016  . Peripheral polyneuropathy ([place]) 03/20/2016  .  Diabetic peripheral neuropathy ([place]) 03/20/2016  . Osteopenia 01/11/2016  . CKD (chronic kidney disease) stage 3, GFR 30-59 ml/min 01/02/2016  . Infected blister of right leg 01/02/2016  . Hand paresthesia 07/18/2015  . Carpal tunnel syndrome 06/07/2015  . Family history of colon cancer   . Diabetes mellitus with neurological manifestations ([place]) 04/18/2015  . Bilateral leg edema 04/11/2015  . Cellulitis of leg, right 04/11/2015  . Abnormality of gait 01/03/2015  . Hereditary and idiopathic peripheral neuropathy 01/03/2015  . GERD (gastroesophageal reflux disease) 06/17/2014  . Morbid obesity with BMI of 50.0-59.9, adult ([place]) 09/05/2013  . Hx of colonic polyps 12/15/2012  . Vitamin B12 deficiency 11/03/2012  . Intrinsic asthma 03/23/2012  . Chronic diastolic heart failure ([place]) 02/20/2011  . OSA (obstructive sleep apnea) 09/17/2010  . URINARY URGENCY 01/08/2010  . CAD, NATIVE VESSEL 11/20/2008  . OSTEOARTHRITIS, KNEES, BILATERAL, SEVERE 06/14/2008  . Hyperlipidemia 05/10/2007  . Essential hypertension 01/18/2007  . HYPOKALEMIA 04/30/2006  . HX, PERSONAL, PEPTIC ULCER DISEASE 04/30/2006    Current Outpatient Prescriptions on File Prior to Visit  Medication Sig Dispense Refill  . albuterol (PROVENTIL HFA) 108 (90 BASE) MCG/ACT inhaler Inhale 2 puffs into the lungs every 4 (four) hours as needed for wheezing or shortness of breath. 1 Inhaler 4  . aspirin (  ECOTRIN LOW STRENGTH) 81 MG EC tablet Take 81 mg by mouth at bedtime.     Marland Kitchen atorvastatin (LIPITOR) 20 MG tablet Take 1 tablet (20 mg total) by mouth daily. 30 tablet 0  . budesonide-formoterol (SYMBICORT) 160-4.5 MCG/ACT inhaler one - two inhalations every 12 hours; gargle and spit after use (Patient taking differently: Inhale 2 puffs into the lungs 2 (two) times daily as needed (shortness of breath). gargle and spit after use) 1 Inhaler 4  . carvedilol (COREG) 25 MG tablet Take 1 tablet (25 mg total) by mouth 2 (two) times daily  with a meal. 60 tablet 3  . Cod Liver Oil CAPS Take 1 capsule by mouth daily.     . cyanocobalamin (,VITAMIN B-12,) 1000 MCG/ML injection Inject 1 mL (1,000 mcg total) into the muscle every 30 (thirty) days. 2 mL 2  . diclofenac sodium (VOLTAREN) 1 % GEL Apply 2 g topically 4 (four) times daily. 100 g 11  . docusate sodium (COLACE) 100 MG capsule Take 2-3 capsules (200-300 mg total) by mouth daily. 90 capsule 11  . DULoxetine (CYMBALTA) 60 MG capsule Take 1 capsule (60 mg total) by mouth daily. 30 capsule 12  . Flaxseed, Linseed, (FLAX SEED OIL) 1000 MG CAPS Take 1,000 mg by mouth 2 (two) times daily.     . fluticasone (FLONASE) 50 MCG/ACT nasal spray Place 2 sprays into both nostrils daily. 16 g 0  . folic acid (FOLVITE) 947 MCG tablet Take 800 mcg by mouth at bedtime.     . Lancets (ONETOUCH ULTRASOFT) lancets Use to help check blood sugars twice a day Dx E11.9 100 each 3  . lidocaine (GLYDO) 2 % jelly Apply 2 grams to affected area, if needed. 30 mL 11  . Multiple Vitamin (MULTIVITAMIN) tablet Take 1 tablet by mouth daily.      Marland Kitchen NEEDLE, DISP, 23 G (EASY TOUCH FLIPLOCK NEEDLES) 23G X 1-1/2" MISC Use to inject B12 once monthly. 2 each 0  . Omega-3 Fatty Acids (FISH OIL) 1000 MG CAPS Take 1,000 mg by mouth daily.     Marland Kitchen omeprazole (PRILOSEC) 40 MG capsule Take 1 capsule (40 mg total) by mouth 2 (two) times daily. 60 capsule 6  . pregabalin (LYRICA) 100 MG capsule Take 1 capsule (100 mg total) by mouth 2 (two) times daily. 90 capsule 5  . RA COL-RITE 100 MG capsule take 1 capsule by mouth twice a day if needed for MILD CONSTIPATION 60 capsule 0  . spironolactone (ALDACTONE) 25 MG tablet Take 1 tablet (25 mg total) by mouth daily. 30 tablet 5  . SYRINGE-NEEDLE, DISP, 3 ML 23G X 1" 3 ML MISC Use with B12 to inject IM every 30 days 50 each 0  . topiramate (TOPAMAX) 100 MG tablet Take 1 tablet (100 mg total) by mouth 2 (two) times daily. 60 tablet 11   No current facility-administered medications on  file prior to visit.     Past Medical History:  Diagnosis Date  . Anemia   . Asthma   . CAD (coronary artery disease)   . Carpal tunnel syndrome   . Cellulitis of both lower extremities 04/11/2015  . CHF (congestive heart failure) ([place])   . Colon polyp, hyperplastic 2007 & 2012  . Complication of anesthesia 1999   svt with renal calculi surgery, no problems since  . CTS (carpal tunnel syndrome)   . Diabetes mellitus   . Eczema   . Hyperlipidemia   . Hypertension   . Leg  ulcer ([place]) 04/24/2015   Right lateral leg No evidence of an infection Monitor closely Keep edema controlled   . Meralgia paresthetica    Dr. [name]  . Morbid obesity ([place])   . Morbid obesity ([place])   . Neuropathy ([facility])    toes and legs  . Osteoarthrosis, unspecified whether generalized or localized, lower leg   . PUD (peptic ulcer disease)   . Shortness of breath dyspnea    with exertion  . Type II or unspecified type diabetes mellitus without mention of complication, not stated as uncontrolled   . Unspecified hereditary and idiopathic peripheral neuropathy   . Vitamin B12 deficiency   . Wound cellulitis    right upper leg, healing well    Past Surgical History:  Procedure Laterality Date  . ABDOMINAL HYSTERECTOMY    . CARDIAC CATHETERIZATION  2002   non obstructive disease  . colonoscopy with polypectomy  2007 & 2012    hyperplastic ;Dr Watt Climes  . COLONOSCOPY WITH PROPOFOL N/A 06/04/2015   Procedure: COLONOSCOPY WITH PROPOFOL;  Surgeon: Jerene Bears, MD;  Location: [facility];  Service: Gastroenterology;  Laterality: N/A;  . DILATION AND CURETTAGE OF UTERUS     multiple  . HEMORRHOID SURGERY    . renal calculi  12/1997   SVT with induction of anesthesia  . right knee arthroscopy    . TONSILLECTOMY AND ADENOIDECTOMY      Social History   Social History  . Marital status: Married    Spouse name: [name]  . Number of children: 1  . Years of education: BS   Occupational History  . Disabled Retired     Social History Main Topics  . Smoking status: Never Smoker  . Smokeless tobacco: Never Used  . Alcohol use No  . Drug use: No  . Sexual activity: Not on file   Other Topics Concern  . Not on file   Social History Narrative   Patient lives at home with her husband [name] Patient is retired and has a Conservation officer, nature.    Caffeine - some times.   Right handed.    Family History  Problem Relation Age of Onset  . Colon cancer Mother   . Prostate cancer Father   . Colon cancer Father   . Diabetes Maternal Aunt   . Breast cancer Maternal Aunt   . Diabetes Maternal Uncle   . Diabetes Paternal Aunt   . Stroke Paternal Aunt     > 65  . Heart disease Paternal Aunt   . Diabetes Paternal Uncle   . Breast cancer Maternal Aunt      X 2  . Breast cancer Cousin     Review of Systems  Constitutional: Negative for chills and fever.  Respiratory: Positive for shortness of breath (better) and wheezing. Negative for cough.   Cardiovascular: Positive for chest pain (with activity), palpitations (with activity) and leg swelling.  Endocrine: Positive for cold intolerance.  Neurological: Positive for light-headedness and headaches.       Objective:   Vitals:   05/13/16 1251  BP: 136/78  Pulse: 80  Temp: 98.2 F (36.8 C)   Wt Readings from Last 3 Encounters:  05/13/16 (!) 360 lb 1.9 oz (163.3 kg)  04/02/16 (!) 330 lb 11 oz (150 kg)  03/04/16 (!) 368 lb (166.9 kg)   Body mass index is 59.93 kg/m.   Physical Exam    Constitutional: Appears well-developed and well-nourished. No distress.  HENT:  Head: Normocephalic  and atraumatic.  Neck: Neck supple. No tracheal deviation present. No thyromegaly present.  No cervical lymphadenopathy Cardiovascular: Normal rate, regular rhythm and normal heart sounds.   No murmur heard. No carotid bruit . Legs wrapped - still 2+  Edema b/l LE - much more pronounced above wraps - controlled with lower legs wrapped  Pulmonary/Chest:  Effort normal and breath sounds normal. No respiratory distress. No has no wheezes. No rales.  Skin: Skin is warm and dry. Not diaphoretic. ulcer on right lower leg healed; ulceration on left upper posterior leg with top layer of skin gone - granulation tissue present, no discharge, no surrounding erythema or swellling Psychiatric: Normal mood and affect. Behavior is normal.      Assessment & Plan:    See Problem List for Assessment and Plan of chronic medical problems.

## 2016-05-13 NOTE — Assessment & Plan Note (Signed)
BP well controlled Current regimen effective and well tolerated Continue current medications at current doses  

## 2016-05-13 NOTE — Assessment & Plan Note (Signed)
Not on cpap Refer back to pulm to re-evaluate  Desaturation at night to 80% in the hospital

## 2016-05-13 NOTE — Assessment & Plan Note (Signed)
Right lower leg ulcer healed, left upper posterior leg with mild skin lose - no infection - stage 2 Continue home wound care with home nurse Prostat at home - sent to pharmacy

## 2016-05-13 NOTE — Assessment & Plan Note (Signed)
Gabapentin was not as effective lyrica is more effective, but has side effects - mental slowing and shakes Will decrease lyrica to 50 mg twice a day and see if side effects decrease, bu she still has relief of pain She will let me know if she needs to increase it to 75 mg

## 2016-05-13 NOTE — Assessment & Plan Note (Signed)
Chronic leg swelling, lungs clear euvolemic Will adjust lasix depending on GFR

## 2016-05-13 NOTE — Assessment & Plan Note (Signed)
Check a1c 

## 2016-05-15 ENCOUNTER — Other Ambulatory Visit: Payer: Self-pay | Admitting: Emergency Medicine

## 2016-05-15 ENCOUNTER — Telehealth: Payer: Self-pay | Admitting: Internal Medicine

## 2016-05-15 DIAGNOSIS — N183 Chronic kidney disease, stage 3 unspecified: Secondary | ICD-10-CM

## 2016-05-15 MED ORDER — POTASSIUM CHLORIDE CRYS ER 20 MEQ PO TBCR: EXTENDED_RELEASE_TABLET | ORAL | 1 refills | Status: DC

## 2016-05-15 NOTE — Telephone Encounter (Signed)
Not sure if we will be able to get it approved.  She does have one open wound/ulcer -stage 2 and has recurrent ulcers.

## 2016-05-15 NOTE — Telephone Encounter (Signed)
Spoke with pt and informed her of MDs response. She will hold off for now on taking this supplement. Will call is she changes her mind.

## 2016-05-15 NOTE — Telephone Encounter (Signed)
Please advise, im not sure what needs to be done?

## 2016-05-15 NOTE — Telephone Encounter (Signed)
Patient called and said to call the doctor ling and request a cover on  Pro-Stat Sugar free [employer] [facility] wound care - [employer] flavor  Patient states the medication is going to cost over $100 and she can not pay that.   [phone]

## 2016-05-15 NOTE — Progress Notes (Unsigned)
RX and labs orders per MDs note.

## 2016-05-16 ENCOUNTER — Other Ambulatory Visit: Payer: Self-pay | Admitting: Internal Medicine

## 2016-05-19 ENCOUNTER — Other Ambulatory Visit: Payer: Medicare Other

## 2016-05-19 DIAGNOSIS — I5032 Chronic diastolic (congestive) heart failure: Secondary | ICD-10-CM | POA: Diagnosis not present

## 2016-05-19 DIAGNOSIS — Z7982 Long term (current) use of aspirin: Secondary | ICD-10-CM

## 2016-05-19 DIAGNOSIS — D631 Anemia in chronic kidney disease: Secondary | ICD-10-CM | POA: Diagnosis not present

## 2016-05-19 DIAGNOSIS — Z9181 History of falling: Secondary | ICD-10-CM

## 2016-05-19 DIAGNOSIS — R262 Difficulty in walking, not elsewhere classified: Secondary | ICD-10-CM | POA: Diagnosis not present

## 2016-05-19 DIAGNOSIS — E1140 Type 2 diabetes mellitus with diabetic neuropathy, unspecified: Secondary | ICD-10-CM | POA: Diagnosis not present

## 2016-05-19 DIAGNOSIS — M1990 Unspecified osteoarthritis, unspecified site: Secondary | ICD-10-CM | POA: Diagnosis not present

## 2016-05-19 DIAGNOSIS — L89892 Pressure ulcer of other site, stage 2: Secondary | ICD-10-CM | POA: Diagnosis not present

## 2016-05-19 DIAGNOSIS — N183 Chronic kidney disease, stage 3 (moderate): Secondary | ICD-10-CM | POA: Diagnosis not present

## 2016-05-19 DIAGNOSIS — Z6841 Body Mass Index (BMI) 40.0 and over, adult: Secondary | ICD-10-CM

## 2016-05-19 DIAGNOSIS — I130 Hypertensive heart and chronic kidney disease with heart failure and stage 1 through stage 4 chronic kidney disease, or unspecified chronic kidney disease: Secondary | ICD-10-CM | POA: Diagnosis not present

## 2016-05-19 DIAGNOSIS — E1122 Type 2 diabetes mellitus with diabetic chronic kidney disease: Secondary | ICD-10-CM | POA: Diagnosis not present

## 2016-05-19 DIAGNOSIS — M6281 Muscle weakness (generalized): Secondary | ICD-10-CM | POA: Diagnosis not present

## 2016-05-19 DIAGNOSIS — I872 Venous insufficiency (chronic) (peripheral): Secondary | ICD-10-CM | POA: Diagnosis not present

## 2016-05-19 DIAGNOSIS — F339 Major depressive disorder, recurrent, unspecified: Secondary | ICD-10-CM | POA: Diagnosis not present

## 2016-05-19 DIAGNOSIS — Z794 Long term (current) use of insulin: Secondary | ICD-10-CM

## 2016-05-26 ENCOUNTER — Ambulatory Visit: Payer: Medicare Other | Admitting: Internal Medicine

## 2016-05-27 ENCOUNTER — Telehealth: Payer: Self-pay | Admitting: Internal Medicine

## 2016-05-27 NOTE — Telephone Encounter (Signed)
PT called and said the patient is doing very well with pt. They would like to continue orders for another month.   2x a week for 3weeks 1x a week for 1 week  [phone] - Laurey Arrow

## 2016-05-27 NOTE — Telephone Encounter (Signed)
Sorry Lovena Le I closed the notes. Please follow up with [facility]. Thank you.

## 2016-05-27 NOTE — Telephone Encounter (Signed)
Spoke with Laurey Arrow to give verbal orders for PT per MD

## 2016-05-29 ENCOUNTER — Encounter: Payer: Self-pay | Admitting: Internal Medicine

## 2016-06-02 ENCOUNTER — Ambulatory Visit (INDEPENDENT_AMBULATORY_CARE_PROVIDER_SITE_OTHER): Payer: Medicare Other | Admitting: Neurology

## 2016-06-02 ENCOUNTER — Encounter: Payer: Self-pay | Admitting: Neurology

## 2016-06-02 VITALS — BP 137/69 | HR 72 | Ht 65.0 in | Wt 360.5 lb

## 2016-06-02 DIAGNOSIS — M5442 Lumbago with sciatica, left side: Secondary | ICD-10-CM

## 2016-06-02 DIAGNOSIS — E1142 Type 2 diabetes mellitus with diabetic polyneuropathy: Secondary | ICD-10-CM

## 2016-06-02 DIAGNOSIS — E1140 Type 2 diabetes mellitus with diabetic neuropathy, unspecified: Secondary | ICD-10-CM

## 2016-06-02 DIAGNOSIS — E538 Deficiency of other specified B group vitamins: Secondary | ICD-10-CM | POA: Diagnosis not present

## 2016-06-02 DIAGNOSIS — R269 Unspecified abnormalities of gait and mobility: Secondary | ICD-10-CM | POA: Diagnosis not present

## 2016-06-02 DIAGNOSIS — G8929 Other chronic pain: Secondary | ICD-10-CM | POA: Insufficient documentation

## 2016-06-02 MED ORDER — DIAZEPAM 5 MG PO TABS: ORAL_TABLET | ORAL | 0 refills | Status: DC

## 2016-06-02 NOTE — Progress Notes (Signed)
Chief Complaint  Patient presents with  . Extremity Weakness    She is here with her husband, April [name].  She is still having significant weakness.  She is still under the care of PT, OT and home nursing care (each coming out to her home twice weekly).      [facility]  PATIENT: April [name] DOB: 1950/02/13  HISTORY OF PRESENT ILLNESS:HISTORY ( Initial evaluation 10/25/2012): evaluation of bilateral muscle cramping.  She had past medical history of obesity, diabetes, hyperlipidemia, hypertension, presenting with bilateral feet paresthesia muscle cramping. saw her previously for diabetic peripheral neuropathy, she complained of bilateral knee pain, bilateral feet paresthesia, difficulty bearing weight, gait difficulty, EMG nerve conduction study in January 2013 confirmed the diagnosis of axonal peripheral neuropathy,   She has been taking Topamax 100 mg twice a day for bilateral feet paresthesia, previous laboratory evaluation demonstrated normal sodium, potassium, magnesium, CPK of 92.  She continued to suffer severe morbid obesity,  low back pain, gait difficulty, bilateral knee pain, recent in one year, she also began to suffer bilateral feet muscle cramping, intermittent, severe, lasting 7-8 minutes, happen about once a week. It often involves her feet muscles, also involves her thigh muscles.  She continued to have significant obesity, gait difficulty.  Lab showed low B12 183 October 2014, received B12 shot, now monthly, repeat B12 level January 2015 was normal 622  She came in with wheelchair, last visit was with Hoyle Sauer in January 2015, she recently developed acid reflux, now on prilosec, mild difficulty swallowing. She still has feet swelling, bilateral feet numbness, burning sensation, frequent bilateral feet, and leg muscle crampiness, couple times a week, woke her up from sleep, very painful, she is taking Topamax 100 mg twice a day, which seems to help her  some,  She has become more sedentary cause of her weight, knee pain, sitting in wheelchair most of the time.  She received home physical therapy,now using hospital bed and gel overlay, lower air loss mattress, new walker, overhead trapezius bar, because of her limited mobility,obesity, orthostatic dizziness, multiple joints pain, especially right knee pain, bilateral feet paresthesia, needle prick pain. She also developed bilateral posterior thigh area skin abrasion, pressor ulcers.  UPDATE June 15th 2017: Last visit was with Hoyle Sauer in December 2016, she is accompanied by her father at today's clinical visit, she was admitted to the hospital in March 11 through 14 2017 for bilateral lower extremity cellulitis  I reviewed hospital records, she presented with bilateral lower extremity swelling and pain, blisters on her right leg, echocardiogram showed grade 1 diastolic dysfunction, there was no evidence of right cardiac failure, ejection fraction 65-70%, no evidence of pulmonary edema, she was aggressively diuresed with Lasix, was negative balance of 4700 cc, was also treated with 5 days of antibiotics, her symptoms has much improved,  I have reviewed laboratory evaluation, A1c was normal 5.4, B12 was within normal limits, normal CBC,with exception of mild anemia hemoglobin of 10 point 9, BMP showed mild elevated glucose 105, creatinine 1.2  She is taking gabapentin 300 mg 3 times a day and also Topamax 100 mg twice a day for her neuropathic pain involving bilateral lower extremity, she has a history of kidney stone, I have advised her stop Topamax, add on Cymbalta 60 mg daily, long-term, continues can be associated with weight gain, if she is not sure about the benefit from the medication, she should stop taking gabapentin use as well  Today she also complains of worsening bilateral  hands paresthesia, involving fourth and fifth fingers, and also first 3 fingers, worse on the right side, mild weakness,  she denies neck pain  Update March 20 2016: Last clinical visit was in June 2017, she returned for electrodiagnostic study today, which showed probable mild axonal peripheral neuropathy, bilateral carpal tunnel syndromes, left-sided severe, right side is mild,  She continued to combat obesity, diffuse body achy pain, most bothersome symptoms is at the bottom of her feet, she had a severe needle prick, shooting pain, difficulty bearing weight,  She was taking gabapentin, was switched to Lyrica on titrating dose 100 mg 3 times a day with limited response, she also complains of side effect of excessive drowsiness with Lyrica.  I reviewed laboratory evaluation in December 2017 mild elevated creatinine 1.2, normal TSH, CBC, A1c 5.5,  UPDATE Jun 02 2016: I reviewed laboratory evaluation in April 2018: Creatinine was normal 1.17, GFR 60, A1c 5.6, today she complains of left lateral thigh area paresthesia, low back pain, she is no longer ambulatory, dealing with significant obesity,  REVIEW OF SYSTEMS: Full 14 system review of systems performed and notable only for those listed, all others are neg: She will, fatigue, cough, wheezing, achy muscles, walking difficulty, numbness,  ALLERGIES: Allergies  Allergen Reactions  . Penicillins Rash    She was told not to take it anymore. Has patient had a PCN reaction causing immediate rash, facial/tongue/throat swelling, SOB or lightheadedness with hypotension: Yes Has patient had a PCN reaction causing severe rash involving mucus membranes or skin necrosis: No Has patient had a PCN reaction that required hospitalization: No Has patient had a PCN reaction occurring within the last 10 years: No If all of the above answers are "NO", then may proceed with Cephalosporin use.'  . Sulfonamide Derivatives Anaphylaxis    REACTION: internal "burning"  . Benazepril Hcl Other (See Comments)    Patient unsure of reaction.  . Hydrochlorothiazide W-Triamterene Other  (See Comments)    Hypokalemia  . Metronidazole Other (See Comments)    Patient unsure of reaction.  Marland Kitchen Spironolactone     "kidney problems"  . Torsemide Other (See Comments)    Patient unsure of reaction.  . Valsartan Other (See Comments)    Patient unsure of reaction.    HOME MEDICATIONS: Outpatient Medications Prior to Visit  Medication Sig Dispense Refill  . Amino Acids-Protein Hydrolys (FEEDING SUPPLEMENT, PRO-STAT SUGAR FREE 64,) LIQD Take 30 mLs by mouth 3 (three) times daily with meals. 2700 mL 3  . aspirin (ECOTRIN LOW STRENGTH) 81 MG EC tablet Take 81 mg by mouth at bedtime.     Marland Kitchen atorvastatin (LIPITOR) 20 MG tablet Take 1 tablet (20 mg total) by mouth daily. 30 tablet 0  . budesonide-formoterol (SYMBICORT) 160-4.5 MCG/ACT inhaler one - two inhalations every 12 hours; gargle and spit after use (Patient taking differently: Inhale 2 puffs into the lungs 2 (two) times daily as needed (shortness of breath). gargle and spit after use) 1 Inhaler 4  . carvedilol (COREG) 25 MG tablet take 1 tablet by mouth twice a day with meals 60 tablet 5  . Cod Liver Oil CAPS Take 1 capsule by mouth daily.     . cyanocobalamin (,VITAMIN B-12,) 1000 MCG/ML injection Inject 1 mL (1,000 mcg total) into the muscle every 30 (thirty) days. 2 mL 2  . diclofenac sodium (VOLTAREN) 1 % GEL Apply 2 g topically 4 (four) times daily. 100 g 11  . docusate sodium (COLACE) 100 MG capsule Take 2-3  capsules (200-300 mg total) by mouth daily. 90 capsule 11  . DULoxetine (CYMBALTA) 60 MG capsule Take 1 capsule (60 mg total) by mouth daily. 30 capsule 12  . Flaxseed, Linseed, (FLAX SEED OIL) 1000 MG CAPS Take 1,000 mg by mouth 2 (two) times daily.     . fluticasone (FLONASE) 50 MCG/ACT nasal spray Place 2 sprays into both nostrils daily. 16 g 0  . folic acid (FOLVITE) 250 MCG tablet Take 800 mcg by mouth at bedtime.     . furosemide (LASIX) 40 MG tablet Take 2 tablets (80 mg total) by mouth 3 (three) times daily. 360  tablet 5  . Lancets (ONETOUCH ULTRASOFT) lancets Use to help check blood sugars twice a day Dx E11.9 100 each 3  . lidocaine (GLYDO) 2 % jelly Apply 2 grams to affected area, if needed. 30 mL 11  . Multiple Vitamin (MULTIVITAMIN) tablet Take 1 tablet by mouth daily.      Marland Kitchen NEEDLE, DISP, 23 G (EASY TOUCH FLIPLOCK NEEDLES) 23G X 1-1/2" MISC Use to inject B12 once monthly. 2 each 0  . Omega-3 Fatty Acids (FISH OIL) 1000 MG CAPS Take 1,000 mg by mouth daily.     Marland Kitchen omeprazole (PRILOSEC) 40 MG capsule Take 1 capsule (40 mg total) by mouth 2 (two) times daily. 60 capsule 6  . potassium chloride SA (K-DUR,KLOR-CON) 20 MEQ tablet Take 3 tablets by mouth daily. 270 tablet 1  . pregabalin (LYRICA) 50 MG capsule Take 1 capsule (50 mg total) by mouth 2 (two) times daily. 60 capsule 3  . RA COL-RITE 100 MG capsule take 1 capsule by mouth twice a day if needed for MILD CONSTIPATION 60 capsule 0  . spironolactone (ALDACTONE) 25 MG tablet Take 1 tablet (25 mg total) by mouth daily. 30 tablet 5  . SYRINGE-NEEDLE, DISP, 3 ML 23G X 1" 3 ML MISC Use with B12 to inject IM every 30 days 50 each 0  . topiramate (TOPAMAX) 100 MG tablet Take 1 tablet (100 mg total) by mouth 2 (two) times daily. 60 tablet 11  . albuterol (PROVENTIL HFA) 108 (90 BASE) MCG/ACT inhaler Inhale 2 puffs into the lungs every 4 (four) hours as needed for wheezing or shortness of breath. 1 Inhaler 4   No facility-administered medications prior to visit.     PAST MEDICAL HISTORY: Past Medical History:  Diagnosis Date  . Anemia   . Asthma   . CAD (coronary artery disease)   . Carpal tunnel syndrome   . Cellulitis of both lower extremities 04/11/2015  . CHF (congestive heart failure) ([place])   . Colon polyp, hyperplastic 2007 & 2012  . Complication of anesthesia 1999   svt with renal calculi surgery, no problems since  . CTS (carpal tunnel syndrome)   . Diabetes mellitus   . Eczema   . Hyperlipidemia   . Hypertension   . Leg ulcer ([place])  04/24/2015   Right lateral leg No evidence of an infection Monitor closely Keep edema controlled   . Meralgia paresthetica    Dr. [name]  . Morbid obesity ([place])   . Morbid obesity ([place])   . Neuropathy    toes and legs  . Osteoarthrosis, unspecified whether generalized or localized, lower leg   . PUD (peptic ulcer disease)   . Shortness of breath dyspnea    with exertion  . Type II or unspecified type diabetes mellitus without mention of complication, not stated as uncontrolled   . Unspecified hereditary and idiopathic peripheral  neuropathy   . Vitamin B12 deficiency   . Wound cellulitis    right upper leg, healing well    PAST SURGICAL HISTORY: Past Surgical History:  Procedure Laterality Date  . ABDOMINAL HYSTERECTOMY    . CARDIAC CATHETERIZATION  2002   non obstructive disease  . colonoscopy with polypectomy  2007 & 2012    hyperplastic ;Dr Watt Climes  . COLONOSCOPY WITH PROPOFOL N/A 06/04/2015   Procedure: COLONOSCOPY WITH PROPOFOL;  Surgeon: Jerene Bears, MD;  Location: [facility];  Service: Gastroenterology;  Laterality: N/A;  . DILATION AND CURETTAGE OF UTERUS     multiple  . HEMORRHOID SURGERY    . renal calculi  12/1997   SVT with induction of anesthesia  . right knee arthroscopy    . TONSILLECTOMY AND ADENOIDECTOMY      FAMILY HISTORY: Family History  Problem Relation Age of Onset  . Colon cancer Mother   . Prostate cancer Father   . Colon cancer Father   . Diabetes Maternal Aunt   . Breast cancer Maternal Aunt   . Diabetes Maternal Uncle   . Diabetes Paternal Aunt   . Stroke Paternal Aunt     > 65  . Heart disease Paternal Aunt   . Diabetes Paternal Uncle   . Breast cancer Maternal Aunt      X 2  . Breast cancer Cousin     SOCIAL HISTORY: Social History   Social History  . Marital status: Married    Spouse name: April [name]  . Number of children: 1  . Years of education: BS   Occupational History  . Disabled Retired   Social History Main Topics  .  Smoking status: Never Smoker  . Smokeless tobacco: Never Used  . Alcohol use No  . Drug use: No  . Sexual activity: Not on file   Other Topics Concern  . Not on file   Social History Narrative   Patient lives at home with her husband April [name]) . Patient is retired and has a Conservation officer, nature.    Caffeine - some times.   Right handed.     PHYSICAL EXAM  Vitals:   06/02/16 1458  BP: 137/69  Pulse: 72  Weight: (!) 360 lb 8 oz (163.5 kg)  Height: 5\' 5"  (1.651 m)   Body mass index is 59.99 kg/m.  Generalized: Well developed, morbidly obesein no acute distress well-groomed Head: normocephalic and atraumatic,. Oropharynx benign  Neck: Supple, no carotid bruits  Cardiac: Regular rate rhythm, no murmur  Musculoskeletal: No deformity Skin bilateral lower extremity discoloration and thick extremely dry skin  Neurological examination   Mentation: Alert oriented to time, place, history taking. Attention span and concentration appropriate. Recent and remote memory intact.  Follows all commands speech and language fluent.   Cranial nerve II-XII: Pupils were equal round reactive to light extraocular movements were full, visual field were full on confrontational test. Facial sensation and strength were normal. hearing was intact to finger rubbing bilaterally. Uvula tongue midline. head turning and shoulder shrug were normal and symmetric.Tongue protrusion into cheek strength was normal. Motor: normal bulk and tone, full strength in the BUE, BLE, fine finger movements normal, no pronator drift.  Sensory: length dependent decreased touch, pinprick to knee level decreased vibratory sense to knees, decreased light touch pinprick at left lateral  Coordination: finger-nose-finger, intact, unable to do heel-to-shin due to body body habitus. dysmetria Reflexes: hyporeflexic absent ankle jerks Gait and Station: not ambulated in wheelchair  DIAGNOSTIC DATA (  LABS, IMAGING, TESTING) - I  reviewed patient records, labs, notes, testing and imaging myself where available.  Lab Results  Component Value Date   WBC 4.6 03/31/2016   HGB 9.9 (L) 03/31/2016   HCT 32.3 (L) 03/31/2016   MCV 93.9 03/31/2016   PLT 92 (L) 03/31/2016      Component Value Date/Time   NA 145 05/13/2016 1350   NA 145 (H) 10/25/2012 1117   K 3.4 (L) 05/13/2016 1350   CL 104 05/13/2016 1350   CO2 33 (H) 05/13/2016 1350   GLUCOSE 85 05/13/2016 1350   BUN 39 (H) 05/13/2016 1350   BUN 24 10/25/2012 1117   CREATININE 1.17 05/13/2016 1350   CALCIUM 9.4 05/13/2016 1350   PROT 6.8 05/13/2016 1350   PROT 6.4 10/25/2012 1117   ALBUMIN 3.8 05/13/2016 1350   ALBUMIN 4.1 10/25/2012 1117   AST 16 05/13/2016 1350   ALT 11 05/13/2016 1350   ALKPHOS 58 05/13/2016 1350   BILITOT 0.5 05/13/2016 1350   GFRNONAA 58 (L) 04/02/2016 0510   GFRAA >60 04/02/2016 0510   Lab Results  Component Value Date   CHOL 158 12/21/2014   HDL 37.80 (L) 12/21/2014   LDLCALC 107 (H) 12/21/2014   LDLDIRECT 169.4 05/02/2007   TRIG 67.0 12/21/2014   CHOLHDL 4 12/21/2014   Lab Results  Component Value Date   HGBA1C 5.6 05/13/2016   Lab Results  Component Value Date   VITAMINB12 1,041 (H) 04/10/2015   Lab Results  Component Value Date   TSH 2.74 12/11/2015      ASSESSMENT AND PLAN  66 y.o. year old female  Morbid obese Peripheral neuropathy, diffuse body achy pain. Left-sided low back pain, radiating pain to left lateral thigh  Most consistent with left meralgia paresthetica, differentiation diagnosis also includes left cervical radiculopathy  Laboratory evaluations for treatable cause for peripheral neuropathy  MRI of lumbar spine     Marcial Pacas, M.D. Ph.D.  [facility] [place], [place] [address] Phone: [phone] Fax:      [phone]

## 2016-06-02 NOTE — Patient Instructions (Signed)
Illustration showing meralgia paresthetica  Meralgia paresthetica Meralgia paresthetica is a condition characterized by tingling, numbness and burning pain in your outer thigh. The cause of meralgia paresthetica is compression of the nerve that supplies sensation to the skin surface of your thigh.  Tight clothing, obesity or weight gain, and pregnancy are common causes of meralgia paresthetica. However, meralgia paresthetica can also be due to local trauma or a disease, such as diabetes.  In most cases, you can relieve meralgia paresthetica with conservative measures, such as wearing looser clothing. In severe cases, treatment may include medications to relieve discomfort or, rarely, surgery.  Symptoms Pressure on the lateral femoral cutaneous nerve, which supplies sensation to your upper thigh, might cause these symptoms of meralgia paresthetica:  Tingling and numbness in the outer (lateral) part of your thigh Burning pain on the surface of the outer part of your thigh These symptoms commonly occur on one side of your body and might intensify after walking or standing.  When to see your doctor  See your doctor if you have symptoms of meralgia paresthetica.  Request an Appointment at [facility] Causes Meralgia paresthetica occurs when the lateral femoral cutaneous nerve - which supplies sensation to the surface of your outer thigh - becomes compressed, or pinched. The lateral femoral cutaneous nerve is purely a sensory nerve and doesn't affect your ability to use your leg muscles.  In most people, this nerve passes through the groin to the upper thigh without trouble. But in meralgia paresthetica, the lateral femoral cutaneous nerve becomes trapped - often under the inguinal ligament, which runs along your groin from your abdomen to your upper thigh.  Common causes of this compression include any condition that increases pressure on the groin, including:  Tight clothing, such as belts,  corsets and tight pants Obesity or weight gain Wearing a heavy tool belt Pregnancy Scar tissue near the inguinal ligament due to injury or past surgery Nerve injury, which can be due to diabetes or seat belt injury after a motor vehicle accident, for example, also can cause meralgia paresthetica.  Risk factors The following might increase your risk of meralgia paresthetica:  Extra weight. Being overweight or obese can increase the pressure on your lateral femoral cutaneous nerve. Pregnancy. A growing belly puts added pressure on your groin, through which the lateral femoral cutaneous nerve passes. Diabetes. Diabetes-related nerve injury can lead to meralgia paresthetica. Age. People between the ages of 82 and 34 are at a higher risk.

## 2016-06-03 LAB — VITAMIN B12: Vitamin B-12: 901 pg/mL (ref 232–1245)

## 2016-06-03 LAB — FOLATE

## 2016-06-03 LAB — CK: CK TOTAL: 52 U/L (ref 24–173)

## 2016-06-03 LAB — TSH: TSH: 1.92 u[IU]/mL (ref 0.450–4.500)

## 2016-06-03 LAB — C-REACTIVE PROTEIN: CRP: 2.2 mg/L (ref 0.0–4.9)

## 2016-06-03 LAB — VITAMIN D 25 HYDROXY (VIT D DEFICIENCY, FRACTURES): VIT D 25 HYDROXY: 47.2 ng/mL (ref 30.0–100.0)

## 2016-06-03 LAB — ANA W/REFLEX: Anti Nuclear Antibody(ANA): NEGATIVE

## 2016-06-03 LAB — SEDIMENTATION RATE: SED RATE: 13 mm/h (ref 0–40)

## 2016-06-04 ENCOUNTER — Telehealth: Payer: Self-pay | Admitting: Internal Medicine

## 2016-06-04 NOTE — Telephone Encounter (Signed)
Pt called and canceled 5/8 appt for B12, states her in-home nurse gave her her B12 for month of May

## 2016-06-09 ENCOUNTER — Telehealth: Payer: Self-pay | Admitting: *Deleted

## 2016-06-09 ENCOUNTER — Ambulatory Visit: Payer: Medicare Other

## 2016-06-09 NOTE — Telephone Encounter (Signed)
Left message for patient to call the office to schedule an appointment to pick up diabetic shoes.  Auth expires 08/06/16

## 2016-06-10 ENCOUNTER — Telehealth: Payer: Self-pay | Admitting: Internal Medicine

## 2016-06-10 NOTE — Telephone Encounter (Signed)
noted 

## 2016-06-10 NOTE — Telephone Encounter (Signed)
Reporting a fall last night 5/8. Fall without injury.  Fell in kitchen walking.

## 2016-06-15 ENCOUNTER — Telehealth: Payer: Self-pay | Admitting: Internal Medicine

## 2016-06-15 NOTE — Telephone Encounter (Signed)
Would like verbals to extend PT for 2x2 starting next week.

## 2016-06-15 NOTE — Telephone Encounter (Signed)
Spoke with Laurey Arrow to inform.

## 2016-06-16 ENCOUNTER — Other Ambulatory Visit: Payer: Medicare Other

## 2016-06-16 DIAGNOSIS — M2140 Flat foot [pes planus] (acquired), unspecified foot: Secondary | ICD-10-CM | POA: Diagnosis not present

## 2016-06-16 DIAGNOSIS — E1142 Type 2 diabetes mellitus with diabetic polyneuropathy: Secondary | ICD-10-CM | POA: Diagnosis not present

## 2016-06-16 DIAGNOSIS — L84 Corns and callosities: Secondary | ICD-10-CM

## 2016-06-17 ENCOUNTER — Other Ambulatory Visit: Payer: Medicare Other

## 2016-06-24 ENCOUNTER — Telehealth: Payer: Self-pay | Admitting: Neurology

## 2016-06-24 NOTE — Telephone Encounter (Signed)
Spoke to patient - she is aware to take the medication as follows:  Take 1-2 tablets thirty minutes prior to MRI.  May take one additional tablet before entering scanner, if needed.  MUST HAVE DRIVER.

## 2016-06-24 NOTE — Telephone Encounter (Signed)
Patient called office in reference to diazepam (VALIUM) 5 MG tablet.  Patient would like to know how to take the medication for MRI.  Please call

## 2016-06-26 NOTE — Telephone Encounter (Signed)
Spoke with Pete to give verbal orders per MD. 

## 2016-06-26 NOTE — Telephone Encounter (Signed)
Needs verbals to extend PT for 1x1 and 2x4 1x3 starting next week.

## 2016-06-30 ENCOUNTER — Telehealth: Payer: Self-pay | Admitting: Internal Medicine

## 2016-06-30 NOTE — Telephone Encounter (Signed)
April [name] from [facility] [phone]  Would like verbals for 1x1, 2x3, 1x1,

## 2016-06-30 NOTE — Telephone Encounter (Signed)
Called stating she has been seeing Pt for wound care and would ike to recertify her for wound care for 2x times week.,   She also has a water blister on right leg and it popped and would like to add an extra visit to look at it again.

## 2016-06-30 NOTE — Telephone Encounter (Signed)
Spoke with Gwyndolyn Saxon to give verbal orders for OT per MD.

## 2016-06-30 NOTE — Telephone Encounter (Signed)
LVM giving verbal orders per MD

## 2016-07-01 ENCOUNTER — Ambulatory Visit
Admission: RE | Admit: 2016-07-01 | Discharge: 2016-07-01 | Disposition: A | Discharge status: Home / Self Care | Payer: Medicare Other | Source: Ambulatory Visit | Attending: Neurology | Admitting: Neurology

## 2016-07-01 DIAGNOSIS — E1142 Type 2 diabetes mellitus with diabetic polyneuropathy: Secondary | ICD-10-CM

## 2016-07-01 DIAGNOSIS — M5442 Lumbago with sciatica, left side: Secondary | ICD-10-CM | POA: Diagnosis not present

## 2016-07-01 DIAGNOSIS — G8929 Other chronic pain: Secondary | ICD-10-CM

## 2016-07-01 DIAGNOSIS — E1140 Type 2 diabetes mellitus with diabetic neuropathy, unspecified: Secondary | ICD-10-CM

## 2016-07-01 DIAGNOSIS — R269 Unspecified abnormalities of gait and mobility: Secondary | ICD-10-CM

## 2016-07-01 DIAGNOSIS — E538 Deficiency of other specified B group vitamins: Secondary | ICD-10-CM

## 2016-07-09 ENCOUNTER — Ambulatory Visit: Payer: Medicare Other

## 2016-07-14 NOTE — Telephone Encounter (Signed)
What has been the past few days?  Has it increased?

## 2016-07-14 NOTE — Telephone Encounter (Signed)
Ernest Mallick called and Dr. Quay Burow has the Pts weight perameters set to  340-350  Today her weight was 352.4

## 2016-07-15 NOTE — Telephone Encounter (Signed)
Spoke with Will from [facility], states the pts current weights are as followed.    6/12 352 6/11 352.2 6/08 353 6/04 351.2 6/01 345.0 5/31 342

## 2016-07-16 ENCOUNTER — Ambulatory Visit (INDEPENDENT_AMBULATORY_CARE_PROVIDER_SITE_OTHER): Payer: Medicare Other | Admitting: Neurology

## 2016-07-16 ENCOUNTER — Encounter: Payer: Self-pay | Admitting: Neurology

## 2016-07-16 VITALS — BP 132/70 | HR 73 | Ht 65.0 in | Wt 360.0 lb

## 2016-07-16 DIAGNOSIS — G5710 Meralgia paresthetica, unspecified lower limb: Secondary | ICD-10-CM

## 2016-07-16 NOTE — Telephone Encounter (Signed)
Spoke with Will to inform.

## 2016-07-16 NOTE — Telephone Encounter (Signed)
Continue to monitor - no change.  She is already taking a very high dose of lasix.

## 2016-07-16 NOTE — Progress Notes (Signed)
Chief Complaint  Patient presents with  . Rm New    Husband, Dwight  . Follow-up    6 wk f/u  . Gait Problem    Lumbar MRI 07/01/16 showed multi-level facet hypertrophy and mild scoliosis. Reports hospitalization in Feb and a fall in May. Continues PT/OT at home.     [facility]  PATIENT: Francena Hanly DOB: [birth year]  HISTORY OF PRESENT ILLNESS:HISTORY ( Initial evaluation 10/25/2012): evaluation of bilateral muscle cramping.  She had past medical history of obesity, diabetes, hyperlipidemia, hypertension, presenting with bilateral feet paresthesia muscle cramping. saw her previously for diabetic peripheral neuropathy, she complained of bilateral knee pain, bilateral feet paresthesia, difficulty bearing weight, gait difficulty, EMG nerve conduction study in January 2013 confirmed the diagnosis of axonal peripheral neuropathy,   She has been taking Topamax 100 mg twice a day for bilateral feet paresthesia, previous laboratory evaluation demonstrated normal sodium, potassium, magnesium, CPK of 92.  She continued to suffer severe morbid obesity,  low back pain, gait difficulty, bilateral knee pain, recent in one year, she also began to suffer bilateral feet muscle cramping, intermittent, severe, lasting 7-8 minutes, happen about once a week. It often involves her feet muscles, also involves her thigh muscles.  She continued to have significant obesity, gait difficulty.  Lab showed low B12 183 October 2014, received B12 shot, now monthly, repeat B12 level January 2015 was normal 622  She came in with wheelchair, last visit was with Hoyle Sauer in January 2015, she recently developed acid reflux, now on prilosec, mild difficulty swallowing. She still has feet swelling, bilateral feet numbness, burning sensation, frequent bilateral feet, and leg muscle crampiness, couple times a week, woke her up from sleep, very painful, she is taking Topamax 100 mg twice a day, which seems to  help her some,  She has become more sedentary cause of her weight, knee pain, sitting in wheelchair most of the time.  She received home physical therapy,now using hospital bed and gel overlay, lower air loss mattress, new walker, overhead trapezius bar, because of her limited mobility,obesity, orthostatic dizziness, multiple joints pain, especially right knee pain, bilateral feet paresthesia, needle prick pain. She also developed bilateral posterior thigh area skin abrasion, pressor ulcers.  UPDATE June 15th 2017: Last visit was with Hoyle Sauer in December 2016, she is accompanied by her father at today's clinical visit, she was admitted to the hospital in March 11 through 14 2017 for bilateral lower extremity cellulitis  I reviewed hospital records, she presented with bilateral lower extremity swelling and pain, blisters on her right leg, echocardiogram showed grade 1 diastolic dysfunction, there was no evidence of right cardiac failure, ejection fraction 65-70%, no evidence of pulmonary edema, she was aggressively diuresed with Lasix, was negative balance of 4700 cc, was also treated with 5 days of antibiotics, her symptoms has much improved,  I have reviewed laboratory evaluation, A1c was normal 5.4, B12 was within normal limits, normal CBC,with exception of mild anemia hemoglobin of 10 point 9, BMP showed mild elevated glucose 105, creatinine 1.2  She is taking gabapentin 300 mg 3 times a day and also Topamax 100 mg twice a day for her neuropathic pain involving bilateral lower extremity, she has a history of kidney stone, I have advised her stop Topamax, add on Cymbalta 60 mg daily, long-term, continues can be associated with weight gain, if she is not sure about the benefit from the medication, she should stop taking gabapentin use as well  Today she also  complains of worsening bilateral hands paresthesia, involving fourth and fifth fingers, and also first 3 fingers, worse on the right side, mild  weakness, she denies neck pain  Update March 20 2016: Last clinical visit was in June 2017, she returned for electrodiagnostic study today, which showed probable mild axonal peripheral neuropathy, bilateral carpal tunnel syndromes, left-sided severe, right side is mild,  She continued to combat obesity, diffuse body achy pain, most bothersome symptoms is at the bottom of her feet, she had a severe needle prick, shooting pain, difficulty bearing weight,  She was taking gabapentin, was switched to Lyrica on titrating dose 100 mg 3 times a day with limited response, she also complains of side effect of excessive drowsiness with Lyrica.  I reviewed laboratory evaluation in December 2017 mild elevated creatinine 1.2, normal TSH, CBC, A1c 5.5,  UPDATE Jun 02 2016: I reviewed laboratory evaluation in April 2018: Creatinine was normal 1.17, GFR 60, A1c 5.6, today she complains of left lateral thigh area paresthesia, low back pain, she is no longer ambulatory, dealing with significant obesity,  UPDATE July 16 2016: She complains of bilateral lateral thigh area paresthesia, burning, numbness, she is now taking Tylenol, ibuprofen. She has tried diclofenac gel without help  She used to take high-dose of Lyrica now started on low-dose 50 mg twice a day since May 13 2016 by her primary care physician Dr. Celso Amy, low-dose help her some, previously while she was taking high-dose of Lyrica she complains of confusion blurry vision, shakiness  She is also taking Topamax 100 mg twice a day  Have reviewed laboratory evaluation in May 2018, normal and active folic acid, C-reactive protein, ESR, vitamin D, CPK, TSH, vitamin B12, ANA, BMP, A1c 5.6, REVIEW OF SYSTEMS: Full 14 system review of systems performed and notable only for those listed, all others are neg: She will, fatigue, cough, wheezing, achy muscles, walking difficulty, numbness,  ALLERGIES: Allergies  Allergen Reactions  . Penicillins Rash      She was told not to take it anymore. Has patient had a PCN reaction causing immediate rash, facial/tongue/throat swelling, SOB or lightheadedness with hypotension: Yes Has patient had a PCN reaction causing severe rash involving mucus membranes or skin necrosis: No Has patient had a PCN reaction that required hospitalization: No Has patient had a PCN reaction occurring within the last 10 years: No If all of the above answers are "NO", then may proceed with Cephalosporin use.'  . Sulfonamide Derivatives Anaphylaxis    REACTION: internal "burning"  . Benazepril Hcl Other (See Comments)    Patient unsure of reaction.  . Hydrochlorothiazide W-Triamterene Other (See Comments)    Hypokalemia  . Metronidazole Other (See Comments)    Patient unsure of reaction.  Marland Kitchen Spironolactone     "kidney problems"  . Torsemide Other (See Comments)    Patient unsure of reaction.  . Valsartan Other (See Comments)    Patient unsure of reaction.    HOME MEDICATIONS: Outpatient Medications Prior to Visit  Medication Sig Dispense Refill  . Amino Acids-Protein Hydrolys (FEEDING SUPPLEMENT, PRO-STAT SUGAR FREE 64,) LIQD Take 30 mLs by mouth 3 (three) times daily with meals. 2700 mL 3  . aspirin (ECOTRIN LOW STRENGTH) 81 MG EC tablet Take 81 mg by mouth at bedtime.     Marland Kitchen atorvastatin (LIPITOR) 20 MG tablet Take 1 tablet (20 mg total) by mouth daily. 30 tablet 0  . budesonide-formoterol (SYMBICORT) 160-4.5 MCG/ACT inhaler one - two inhalations every 12 hours; gargle and  spit after use (Patient taking differently: Inhale 2 puffs into the lungs 2 (two) times daily as needed (shortness of breath). gargle and spit after use) 1 Inhaler 4  . carvedilol (COREG) 25 MG tablet take 1 tablet by mouth twice a day with meals 60 tablet 5  . Cod Liver Oil CAPS Take 1 capsule by mouth daily.     . cyanocobalamin (,VITAMIN B-12,) 1000 MCG/ML injection Inject 1 mL (1,000 mcg total) into the muscle every 30 (thirty) days. 2 mL 2   . diazepam (VALIUM) 5 MG tablet As needed for MRI 3 tablet 0  . diclofenac sodium (VOLTAREN) 1 % GEL Apply 2 g topically 4 (four) times daily. 100 g 11  . docusate sodium (COLACE) 100 MG capsule Take 2-3 capsules (200-300 mg total) by mouth daily. 90 capsule 11  . DULoxetine (CYMBALTA) 60 MG capsule Take 1 capsule (60 mg total) by mouth daily. 30 capsule 12  . Flaxseed, Linseed, (FLAX SEED OIL) 1000 MG CAPS Take 1,000 mg by mouth 2 (two) times daily.     . fluticasone (FLONASE) 50 MCG/ACT nasal spray Place 2 sprays into both nostrils daily. 16 g 0  . folic acid (FOLVITE) 734 MCG tablet Take 800 mcg by mouth at bedtime.     . furosemide (LASIX) 40 MG tablet Take 2 tablets (80 mg total) by mouth 3 (three) times daily. 360 tablet 5  . Lancets (ONETOUCH ULTRASOFT) lancets Use to help check blood sugars twice a day Dx E11.9 100 each 3  . lidocaine (GLYDO) 2 % jelly Apply 2 grams to affected area, if needed. 30 mL 11  . Multiple Vitamin (MULTIVITAMIN) tablet Take 1 tablet by mouth daily.      Marland Kitchen NEEDLE, DISP, 23 G (EASY TOUCH FLIPLOCK NEEDLES) 23G X 1-1/2" MISC Use to inject B12 once monthly. 2 each 0  . Omega-3 Fatty Acids (FISH OIL) 1000 MG CAPS Take 1,000 mg by mouth daily.     Marland Kitchen omeprazole (PRILOSEC) 40 MG capsule Take 1 capsule (40 mg total) by mouth 2 (two) times daily. 60 capsule 6  . potassium chloride SA (K-DUR,KLOR-CON) 20 MEQ tablet Take 3 tablets by mouth daily. 270 tablet 1  . pregabalin (LYRICA) 50 MG capsule Take 1 capsule (50 mg total) by mouth 2 (two) times daily. 60 capsule 3  . RA COL-RITE 100 MG capsule take 1 capsule by mouth twice a day if needed for MILD CONSTIPATION 60 capsule 0  . spironolactone (ALDACTONE) 25 MG tablet Take 1 tablet (25 mg total) by mouth daily. 30 tablet 5  . SYRINGE-NEEDLE, DISP, 3 ML 23G X 1" 3 ML MISC Use with B12 to inject IM every 30 days 50 each 0  . topiramate (TOPAMAX) 100 MG tablet Take 1 tablet (100 mg total) by mouth 2 (two) times daily. 60 tablet  11  . albuterol (PROVENTIL HFA) 108 (90 BASE) MCG/ACT inhaler Inhale 2 puffs into the lungs every 4 (four) hours as needed for wheezing or shortness of breath. 1 Inhaler 4   No facility-administered medications prior to visit.     PAST MEDICAL HISTORY: Past Medical History:  Diagnosis Date  . Anemia   . Asthma   . CAD (coronary artery disease)   . Carpal tunnel syndrome   . Cellulitis of both lower extremities 04/11/2015  . CHF (congestive heart failure) ([place])   . Colon polyp, hyperplastic 2007 & 2012  . Complication of anesthesia 1999   svt with renal calculi surgery, no problems  since  . CTS (carpal tunnel syndrome)   . Diabetes mellitus   . Eczema   . Hyperlipidemia   . Hypertension   . Leg ulcer ([place]) 04/24/2015   Right lateral leg No evidence of an infection Monitor closely Keep edema controlled   . Meralgia paresthetica    Dr. [name]  . Morbid obesity ([place])   . Morbid obesity ([place])   . Neuropathy    toes and legs  . Osteoarthrosis, unspecified whether generalized or localized, lower leg   . PUD (peptic ulcer disease)   . Shortness of breath dyspnea    with exertion  . Type II or unspecified type diabetes mellitus without mention of complication, not stated as uncontrolled   . Unspecified hereditary and idiopathic peripheral neuropathy   . Vitamin B12 deficiency   . Wound cellulitis    right upper leg, healing well    PAST SURGICAL HISTORY: Past Surgical History:  Procedure Laterality Date  . ABDOMINAL HYSTERECTOMY    . CARDIAC CATHETERIZATION  2002   non obstructive disease  . colonoscopy with polypectomy  2007 & 2012    hyperplastic ;Dr Watt Climes  . COLONOSCOPY WITH PROPOFOL N/A 06/04/2015   Procedure: COLONOSCOPY WITH PROPOFOL;  Surgeon: Jerene Bears, MD;  Location: [facility];  Service: Gastroenterology;  Laterality: N/A;  . DILATION AND CURETTAGE OF UTERUS     multiple  . HEMORRHOID SURGERY    . renal calculi  12/1997   SVT with induction of anesthesia  .  right knee arthroscopy    . TONSILLECTOMY AND ADENOIDECTOMY      FAMILY HISTORY: Family History  Problem Relation Age of Onset  . Colon cancer Mother   . Prostate cancer Father   . Colon cancer Father   . Diabetes Maternal Aunt   . Breast cancer Maternal Aunt   . Diabetes Maternal Uncle   . Diabetes Paternal Aunt   . Stroke Paternal Aunt        > 65  . Heart disease Paternal Aunt   . Diabetes Paternal Uncle   . Breast cancer Maternal Aunt         X 2  . Breast cancer Cousin     SOCIAL HISTORY: Social History   Social History  . Marital status: Married    Spouse name: [name]  . Number of children: 1  . Years of education: BS   Occupational History  . Disabled Retired   Social History Main Topics  . Smoking status: Never Smoker  . Smokeless tobacco: Never Used  . Alcohol use No  . Drug use: No  . Sexual activity: Not on file   Other Topics Concern  . Not on file   Social History Narrative   Patient lives at home with her husband [name] Patient is retired and has a Conservation officer, nature.    Caffeine - some times.   Right handed.     PHYSICAL EXAM  Vitals:   07/16/16 1440  BP: 132/70  Pulse: 73  Weight: (!) 360 lb (163.3 kg)  Height: '5\' 5"'  (1.651 m)   Body mass index is 59.91 kg/m.  Generalized: Well developed, morbidly obesein no acute distress well-groomed Head: normocephalic and atraumatic,. Oropharynx benign  Neck: Supple, no carotid bruits  Cardiac: Regular rate rhythm, no murmur  Musculoskeletal: No deformity Skin bilateral lower extremity discoloration and thick extremely dry skin  Neurological examination   Mentation: Alert oriented to time, place, history taking. Attention span and concentration appropriate. Recent  and remote memory intact.  Follows all commands speech and language fluent.   Cranial nerve II-XII: Pupils were equal round reactive to light extraocular movements were full, visual field were full on confrontational test.  Facial sensation and strength were normal. hearing was intact to finger rubbing bilaterally. Uvula tongue midline. head turning and shoulder shrug were normal and symmetric.Tongue protrusion into cheek strength was normal. Motor: normal bulk and tone, full strength in the BUE, BLE, fine finger movements normal, no pronator drift.  Sensory: length dependent decreased touch, pinprick to knee level decreased vibratory sense to knees, decreased light touch pinprick at left lateral  Coordination: finger-nose-finger, intact, unable to do heel-to-shin due to body body habitus. dysmetria Reflexes: hyporeflexic absent ankle jerks Gait and Station: not ambulated in wheelchair  DIAGNOSTIC DATA (LABS, IMAGING, TESTING) - I reviewed patient records, labs, notes, testing and imaging myself where available.  Lab Results  Component Value Date   WBC 4.6 03/31/2016   HGB 9.9 (L) 03/31/2016   HCT 32.3 (L) 03/31/2016   MCV 93.9 03/31/2016   PLT 92 (L) 03/31/2016      Component Value Date/Time   NA 145 05/13/2016 1350   NA 145 (H) 10/25/2012 1117   K 3.4 (L) 05/13/2016 1350   CL 104 05/13/2016 1350   CO2 33 (H) 05/13/2016 1350   GLUCOSE 85 05/13/2016 1350   BUN 39 (H) 05/13/2016 1350   BUN 24 10/25/2012 1117   CREATININE 1.17 05/13/2016 1350   CALCIUM 9.4 05/13/2016 1350   PROT 6.8 05/13/2016 1350   PROT 6.4 10/25/2012 1117   ALBUMIN 3.8 05/13/2016 1350   ALBUMIN 4.1 10/25/2012 1117   AST 16 05/13/2016 1350   ALT 11 05/13/2016 1350   ALKPHOS 58 05/13/2016 1350   BILITOT 0.5 05/13/2016 1350   GFRNONAA 58 (L) 04/02/2016 0510   GFRAA >60 04/02/2016 0510   Lab Results  Component Value Date   CHOL 158 12/21/2014   HDL 37.80 (L) 12/21/2014   LDLCALC 107 (H) 12/21/2014   LDLDIRECT 169.4 05/02/2007   TRIG 67.0 12/21/2014   CHOLHDL 4 12/21/2014   Lab Results  Component Value Date   HGBA1C 5.6 05/13/2016   Lab Results  Component Value Date   [id number] 829 06/02/2016   Lab Results    Component Value Date   TSH 1.920 06/02/2016      ASSESSMENT AND PLAN  66 y.o. year old female  Morbid obese Peripheral neuropathy, diffuse body achy pain. Left-sided low back pain, radiating pain to left lateral thigh  Most consistent with left meralgia paresthetica, differentiation diagnosis also includes left cervical radiculopathy  Continue Cymbalta, Lyrica, diclofenac gel,       Marcial Pacas, M.D. Ph.D.  [facility] [place], [place] [address] Phone: [phone] Fax:      [phone]

## 2016-07-20 ENCOUNTER — Telehealth: Payer: Self-pay | Admitting: Internal Medicine

## 2016-07-20 NOTE — Telephone Encounter (Signed)
Pt said [facility] faxed over a form today for her to receive a ramp on her back porch,  The form has questions regarding her health and they would like it filled out and faxed back . Pt just calling to state it was faxed over to [place] today

## 2016-07-21 DIAGNOSIS — Z6841 Body Mass Index (BMI) 40.0 and over, adult: Secondary | ICD-10-CM | POA: Diagnosis not present

## 2016-07-21 DIAGNOSIS — E1140 Type 2 diabetes mellitus with diabetic neuropathy, unspecified: Secondary | ICD-10-CM | POA: Diagnosis not present

## 2016-07-21 DIAGNOSIS — F339 Major depressive disorder, recurrent, unspecified: Secondary | ICD-10-CM | POA: Diagnosis not present

## 2016-07-21 DIAGNOSIS — E1122 Type 2 diabetes mellitus with diabetic chronic kidney disease: Secondary | ICD-10-CM | POA: Diagnosis not present

## 2016-07-21 DIAGNOSIS — Z794 Long term (current) use of insulin: Secondary | ICD-10-CM

## 2016-07-21 DIAGNOSIS — I5032 Chronic diastolic (congestive) heart failure: Secondary | ICD-10-CM | POA: Diagnosis not present

## 2016-07-21 DIAGNOSIS — Z9181 History of falling: Secondary | ICD-10-CM | POA: Diagnosis not present

## 2016-07-21 DIAGNOSIS — N183 Chronic kidney disease, stage 3 (moderate): Secondary | ICD-10-CM | POA: Diagnosis not present

## 2016-07-21 DIAGNOSIS — D631 Anemia in chronic kidney disease: Secondary | ICD-10-CM | POA: Diagnosis not present

## 2016-07-21 DIAGNOSIS — M6281 Muscle weakness (generalized): Secondary | ICD-10-CM | POA: Diagnosis not present

## 2016-07-21 DIAGNOSIS — I872 Venous insufficiency (chronic) (peripheral): Secondary | ICD-10-CM | POA: Diagnosis not present

## 2016-07-21 DIAGNOSIS — I130 Hypertensive heart and chronic kidney disease with heart failure and stage 1 through stage 4 chronic kidney disease, or unspecified chronic kidney disease: Secondary | ICD-10-CM | POA: Diagnosis not present

## 2016-07-21 DIAGNOSIS — Z7982 Long term (current) use of aspirin: Secondary | ICD-10-CM

## 2016-07-23 NOTE — Telephone Encounter (Signed)
Pt called back and the name of the company is [facility], I gave her the main fax number and side b's fax number.   She is going to refax it.

## 2016-07-23 NOTE — Telephone Encounter (Signed)
Have not received anything in regards to a ramp. LVM informing pt.

## 2016-07-24 NOTE — Telephone Encounter (Signed)
Pt called asking if you recieved the fax.

## 2016-07-24 NOTE — Telephone Encounter (Signed)
Called pt telling her we have not received anything,  [facility] phone number is [phone] ask for  Sandrea Hughs, she will also call Tobin Chad and tell her to  call us

## 2016-07-24 NOTE — Telephone Encounter (Signed)
Spoke with pt, fax was received this afternoon from [facility]

## 2016-07-25 ENCOUNTER — Other Ambulatory Visit: Payer: Self-pay | Admitting: Neurology

## 2016-07-27 ENCOUNTER — Telehealth: Payer: Self-pay | Admitting: Neurology

## 2016-07-27 ENCOUNTER — Other Ambulatory Visit: Payer: Self-pay | Admitting: *Deleted

## 2016-07-27 MED ORDER — TOPIRAMATE 100 MG PO TABS: 100.0000 mg | ORAL_TABLET | Freq: Two times a day (BID) | ORAL | 11 refills | Status: DC

## 2016-07-27 NOTE — Telephone Encounter (Signed)
Patient requesting refill of topiramate (TOPAMAX) 100 MG tablet called to [facility] on [address].

## 2016-07-27 NOTE — Telephone Encounter (Signed)
Rx due and refills sent to requested pharmacy.

## 2016-07-30 ENCOUNTER — Other Ambulatory Visit: Payer: Medicare Other | Admitting: Orthotics

## 2016-08-04 ENCOUNTER — Ambulatory Visit (INDEPENDENT_AMBULATORY_CARE_PROVIDER_SITE_OTHER): Payer: Medicare Other | Admitting: Sports Medicine

## 2016-08-04 DIAGNOSIS — B351 Tinea unguium: Secondary | ICD-10-CM | POA: Diagnosis not present

## 2016-08-04 DIAGNOSIS — E1142 Type 2 diabetes mellitus with diabetic polyneuropathy: Secondary | ICD-10-CM | POA: Diagnosis not present

## 2016-08-04 DIAGNOSIS — M2142 Flat foot [pes planus] (acquired), left foot: Secondary | ICD-10-CM

## 2016-08-04 DIAGNOSIS — I890 Lymphedema, not elsewhere classified: Secondary | ICD-10-CM

## 2016-08-04 DIAGNOSIS — M79675 Pain in left toe(s): Secondary | ICD-10-CM

## 2016-08-04 DIAGNOSIS — M2141 Flat foot [pes planus] (acquired), right foot: Secondary | ICD-10-CM

## 2016-08-04 DIAGNOSIS — M79674 Pain in right toe(s): Secondary | ICD-10-CM | POA: Diagnosis not present

## 2016-08-04 NOTE — Progress Notes (Signed)
Subjective: April [name] is a 66 y.o. female patient with history of diabetes who returns to office today complaining of long, painful nails while ambulating in shoes; unable to trim. Patient states that the glucose reading this morning was ok admits she does not like her diabetic shoes. Makes her feet swell more and is uncomfortable.  Patient Active Problem List   Diagnosis Date Noted  . Meralgia paresthetica 07/16/2016  . Chronic left-sided low back pain with left-sided sciatica 06/02/2016  . Venous stasis ulcer ([place]) 05/13/2016  . Acute diastolic CHF (congestive heart failure) ([place]) 03/28/2016  . Cellulitis and abscess of foot 03/28/2016  . Whole body pain 03/20/2016  . Peripheral polyneuropathy 03/20/2016  . Diabetic peripheral neuropathy ([place]) 03/20/2016  . Osteopenia 01/11/2016  . CKD (chronic kidney disease) stage 3, GFR 30-59 ml/min 01/02/2016  . Infected blister of right leg 01/02/2016  . Hand paresthesia 07/18/2015  . Carpal tunnel syndrome 06/07/2015  . Family history of colon cancer   . Diabetes mellitus with neurological manifestations ([place]) 04/18/2015  . Bilateral leg edema 04/11/2015  . Cellulitis of leg, right 04/11/2015  . Abnormality of gait 01/03/2015  . Hereditary and idiopathic peripheral neuropathy 01/03/2015  . GERD (gastroesophageal reflux disease) 06/17/2014  . Morbid obesity with BMI of 50.0-59.9, adult ([place]) 09/05/2013  . Hx of colonic polyps 12/15/2012  . Vitamin B12 deficiency 11/03/2012  . Intrinsic asthma 03/23/2012  . Chronic diastolic heart failure ([place]) 02/20/2011  . OSA (obstructive sleep apnea) 09/17/2010  . URINARY URGENCY 01/08/2010  . CAD, NATIVE VESSEL 11/20/2008  . OSTEOARTHRITIS, KNEES, BILATERAL, SEVERE 06/14/2008  . Hyperlipidemia 05/10/2007  . Essential hypertension 01/18/2007  . HYPOKALEMIA 04/30/2006  . HX, PERSONAL, PEPTIC ULCER DISEASE 04/30/2006   Current Outpatient Prescriptions on File Prior to Visit  Medication Sig  Dispense Refill  . albuterol (PROVENTIL HFA) 108 (90 BASE) MCG/ACT inhaler Inhale 2 puffs into the lungs every 4 (four) hours as needed for wheezing or shortness of breath. 1 Inhaler 4  . Amino Acids-Protein Hydrolys (FEEDING SUPPLEMENT, PRO-STAT SUGAR FREE 64,) LIQD Take 30 mLs by mouth 3 (three) times daily with meals. 2700 mL 3  . aspirin (ECOTRIN LOW STRENGTH) 81 MG EC tablet Take 81 mg by mouth at bedtime.     Marland Kitchen atorvastatin (LIPITOR) 20 MG tablet Take 1 tablet (20 mg total) by mouth daily. 30 tablet 0  . budesonide-formoterol (SYMBICORT) 160-4.5 MCG/ACT inhaler one - two inhalations every 12 hours; gargle and spit after use (Patient taking differently: Inhale 2 puffs into the lungs 2 (two) times daily as needed (shortness of breath). gargle and spit after use) 1 Inhaler 4  . carvedilol (COREG) 25 MG tablet take 1 tablet by mouth twice a day with meals 60 tablet 5  . Cod Liver Oil CAPS Take 1 capsule by mouth daily.     . cyanocobalamin (,VITAMIN B-12,) 1000 MCG/ML injection Inject 1 mL (1,000 mcg total) into the muscle every 30 (thirty) days. 2 mL 2  . diazepam (VALIUM) 5 MG tablet As needed for MRI 3 tablet 0  . diclofenac sodium (VOLTAREN) 1 % GEL Apply 2 g topically 4 (four) times daily. 100 g 11  . docusate sodium (COLACE) 100 MG capsule Take 2-3 capsules (200-300 mg total) by mouth daily. 90 capsule 11  . DULoxetine (CYMBALTA) 60 MG capsule Take 1 capsule (60 mg total) by mouth daily. 30 capsule 12  . Flaxseed, Linseed, (FLAX SEED OIL) 1000 MG CAPS Take 1,000 mg by mouth 2 (two)  times daily.     . fluticasone (FLONASE) 50 MCG/ACT nasal spray Place 2 sprays into both nostrils daily. 16 g 0  . folic acid (FOLVITE) 235 MCG tablet Take 800 mcg by mouth at bedtime.     . furosemide (LASIX) 40 MG tablet Take 2 tablets (80 mg total) by mouth 3 (three) times daily. 360 tablet 5  . Lancets (ONETOUCH ULTRASOFT) lancets Use to help check blood sugars twice a day Dx E11.9 100 each 3  . lidocaine  (GLYDO) 2 % jelly Apply 2 grams to affected area, if needed. 30 mL 11  . Multiple Vitamin (MULTIVITAMIN) tablet Take 1 tablet by mouth daily.      Marland Kitchen NEEDLE, DISP, 23 G (EASY TOUCH FLIPLOCK NEEDLES) 23G X 1-1/2" MISC Use to inject B12 once monthly. 2 each 0  . Omega-3 Fatty Acids (FISH OIL) 1000 MG CAPS Take 1,000 mg by mouth daily.     Marland Kitchen omeprazole (PRILOSEC) 40 MG capsule Take 1 capsule (40 mg total) by mouth 2 (two) times daily. 60 capsule 6  . potassium chloride SA (K-DUR,KLOR-CON) 20 MEQ tablet Take 3 tablets by mouth daily. 270 tablet 1  . pregabalin (LYRICA) 50 MG capsule Take 1 capsule (50 mg total) by mouth 2 (two) times daily. 60 capsule 3  . RA COL-RITE 100 MG capsule take 1 capsule by mouth twice a day if needed for MILD CONSTIPATION 60 capsule 0  . spironolactone (ALDACTONE) 25 MG tablet Take 1 tablet (25 mg total) by mouth daily. 30 tablet 5  . SYRINGE-NEEDLE, DISP, 3 ML 23G X 1" 3 ML MISC Use with B12 to inject IM every 30 days 50 each 0  . topiramate (TOPAMAX) 100 MG tablet Take 1 tablet (100 mg total) by mouth 2 (two) times daily. 60 tablet 11   No current facility-administered medications on file prior to visit.    Allergies  Allergen Reactions  . Penicillins Rash    She was told not to take it anymore. Has patient had a PCN reaction causing immediate rash, facial/tongue/throat swelling, SOB or lightheadedness with hypotension: Yes Has patient had a PCN reaction causing severe rash involving mucus membranes or skin necrosis: No Has patient had a PCN reaction that required hospitalization: No Has patient had a PCN reaction occurring within the last 10 years: No If all of the above answers are "NO", then may proceed with Cephalosporin use.'  . Sulfonamide Derivatives Anaphylaxis    REACTION: internal "burning"  . Benazepril Hcl Other (See Comments)    Patient unsure of reaction.  . Hydrochlorothiazide W-Triamterene Other (See Comments)    Hypokalemia  . Metronidazole Other  (See Comments)    Patient unsure of reaction.  Marland Kitchen Spironolactone     "kidney problems"  . Torsemide Other (See Comments)    Patient unsure of reaction.  . Valsartan Other (See Comments)    Patient unsure of reaction.    Recent Results (from the past 2160 hour(s))  Hemoglobin A1c     Status: None   Collection Time: 05/13/16  1:50 PM  Result Value Ref Range   Hgb A1c MFr Bld 5.6 4.6 - 6.5 %    Comment: Glycemic Control Guidelines for People with Diabetes:Non Diabetic:  <6%Goal of Therapy: <7%Additional Action Suggested:  >8%   Comprehensive metabolic panel     Status: Abnormal   Collection Time: 05/13/16  1:50 PM  Result Value Ref Range   Sodium 145 135 - 145 mEq/L   Potassium 3.4 (L) 3.5 -  5.1 mEq/L   Chloride 104 96 - 112 mEq/L   CO2 33 (H) 19 - 32 mEq/L   Glucose, Bld 85 70 - 99 mg/dL   BUN 39 (H) 6 - 23 mg/dL   Creatinine, Ser 1.17 0.40 - 1.20 mg/dL   Total Bilirubin 0.5 0.2 - 1.2 mg/dL   Alkaline Phosphatase 58 39 - 117 U/L   AST 16 0 - 37 U/L   ALT 11 0 - 35 U/L   Total Protein 6.8 6.0 - 8.3 g/dL   Albumin 3.8 3.5 - 5.2 g/dL   Calcium 9.4 8.4 - 10.5 mg/dL   GFR 59.53 (L) >60.00 mL/min  ANA w/Reflex     Status: None   Collection Time: 06/02/16  4:07 PM  Result Value Ref Range   Anit Nuclear Antibody(ANA) Negative Negative  Vitamin B12     Status: None   Collection Time: 06/02/16  4:07 PM  Result Value Ref Range   Vitamin B-12 901 232 - 1,245 pg/mL  TSH     Status: None   Collection Time: 06/02/16  4:07 PM  Result Value Ref Range   TSH 1.920 0.450 - 4.500 uIU/mL  CK     Status: None   Collection Time: 06/02/16  4:07 PM  Result Value Ref Range   Total CK 52 24 - 173 U/L  VITAMIN D 25 Hydroxy (Vit-D Deficiency, Fractures)     Status: None   Collection Time: 06/02/16  4:07 PM  Result Value Ref Range   Vit D, 25-Hydroxy 47.2 30.0 - 100.0 ng/mL    Comment: Vitamin D deficiency has been defined by the Institute of Medicine and an Endocrine Society practice guideline  as a level of serum 25-OH vitamin D less than 20 ng/mL (1,2). The Endocrine Society went on to further define vitamin D insufficiency as a level between 21 and 29 ng/mL (2). 1. IOM (Institute of Medicine). 2010. Dietary reference    intakes for calcium and D. [place]: The    [employer]. 2. Holick MF, Binkley [place], Bischoff-Ferrari HA, et al.    Evaluation, treatment, and prevention of vitamin D    deficiency: an Endocrine Society clinical practice    guideline. JCEM. 2011 Jul; 96(7):1911-30.   Sedimentation rate     Status: None   Collection Time: 06/02/16  4:07 PM  Result Value Ref Range   Sed Rate 13 0 - 40 mm/hr  C-reactive protein     Status: None   Collection Time: 06/02/16  4:07 PM  Result Value Ref Range   CRP 2.2 0.0 - 4.9 mg/L  Folate     Status: None   Collection Time: 06/02/16  4:07 PM  Result Value Ref Range   Folate >20.0 >3.0 ng/mL    Comment: A serum folate concentration of less than 3.1 ng/mL is considered to represent clinical deficiency.     Objective: General: Patient is awake, alert, and oriented x 3 and in no acute distress.  Integument: Skin is warm, dry and supple bilateral. Nails are tender, long, thickened and dystrophic with subungual debris, consistent with onychomycosis, 1-5 bilateral. No signs of infection. No open lesions or preulcerative lesions present bilateral. Unna boots on legs bilateral patient goes to wound care center. Minimal Callus to heels bilateral. Remaining integument unremarkable.  Vasculature:  Dorsalis Pedis pulse 0/4 bilateral. Posterior Tibial pulse  0/4 bilateral. Capillary fill time <5 sec 1-5 bilateral. No hair growth to the level of the digits.Temperature gradient within normal limits.  No varicosities present bilateral. Chronic edema present bilateral.   Neurology: The patient has diminished sensation measured with a 5.07/10g Semmes Weinstein Monofilament at all pedal sites bilateral . Vibratory sensation  diminished bilateral with tuning fork. No Babinski sign present bilateral.   Musculoskeletal: Asymptomatic pes planus pedal deformities noted bilateral. Muscular strength 4/5 in all lower extremity muscular groups bilateral without pain on range of motion. No tenderness with calf compression bilateral.  Assessment and Plan: Problem List Items Addressed This Visit    None    Visit Diagnoses    Dermatophytosis of nail    -  Primary   Diabetic polyneuropathy associated with type 2 diabetes mellitus (HCC)       Toe pain, bilateral       Lymphedema       Pes planus of both feet         -Examined patient. -Discussed and educated patient on diabetic foot care, especially with  regards to the vascular, neurological and musculoskeletal systems.  -Stressed the importance of good glycemic control and the detriment of not  controlling glucose levels in relation to the foot. -Mechanically debrided all nails 1-5 bilateral using sterile nail nipper and filed with dremel without incident  -Dry callus to heels; recommend continue with skin emollients as previous -Diabetic shoes can not be returned advised patient to use insoles in other shoes instead -Answered all patient questions -Patient to return  in 3 months for at risk foot care -Patient advised to call the office if any problems or questions arise in the meantime.  Landis Martins, DPM

## 2016-08-10 ENCOUNTER — Ambulatory Visit: Payer: Medicare Other

## 2016-08-11 ENCOUNTER — Ambulatory Visit: Payer: Medicare Other

## 2016-08-13 NOTE — Progress Notes (Signed)
Subjective:    Patient ID: April [name], female    DOB: [birth year], 66 y.o.   MRN: [id number]  HPI The patient is here for follow up.  Chronic diastolic heart failure, Hypertension: She is taking her medication daily. She is compliant with a low sodium diet.  She denies chest pain, palpitations and regular headaches. She is not exercising regularly, but is doing physical therapy and occupational therapy.  She does not monitor her blood pressure at home.   Hyperlipidemia: She is taking her medication daily. She is compliant with a low fat/cholesterol diet. She is not exercising regularly. She denies myalgias.   GERD:  She is taking her medication daily as prescribed.  She denies any GERD symptoms and feels her GERD is well controlled.   Diabetes with neuropathy: She is taking her medication daily as prescribed. She is compliant with a diabetic diet. She has lost some weight since she was here last. She is not exercising regularly.  She checks her feet daily and denies foot lesions. She is up-to-date with an ophthalmology examination - [facility] - dr lyells. .   CKD: She is following with nephrology. They are regulating her Lasix, which she is taking as prescribed.  B/l LE lymphedema, chronic venous stasis ulcers:  No active ulcers.  She thinks the swelling in her legs is fairly controlled, but she still has swelling.   Tuesday she had the shingrix #1 - she has some pain and redness at the site. She was concerned because she has not seen much improvement since 3 days ago.  She fell 5/8 and snce then her left shoulder still hurts, especially when lifting the arm. She is doing physical therapy and occupational therapy. She has not had the shoulder evaluated.    Medications and allergies reviewed with patient and updated if appropriate.  Patient Active Problem List   Diagnosis Date Noted  . Meralgia paresthetica 07/16/2016  . Chronic left-sided low back pain with left-sided sciatica  06/02/2016  . Venous stasis ulcer ([place]) 05/13/2016  . Cellulitis and abscess of foot 03/28/2016  . Whole body pain 03/20/2016  . Peripheral polyneuropathy 03/20/2016  . Diabetic peripheral neuropathy ([place]) 03/20/2016  . Osteopenia 01/11/2016  . CKD (chronic kidney disease) stage 3, GFR 30-59 ml/min 01/02/2016  . Infected blister of right leg 01/02/2016  . Hand paresthesia 07/18/2015  . Carpal tunnel syndrome 06/07/2015  . Family history of colon cancer   . Diabetes mellitus with neurological manifestations ([place]) 04/18/2015  . Bilateral leg edema 04/11/2015  . Cellulitis of leg, right 04/11/2015  . Abnormality of gait 01/03/2015  . Hereditary and idiopathic peripheral neuropathy 01/03/2015  . GERD (gastroesophageal reflux disease) 06/17/2014  . Morbid obesity with BMI of 50.0-59.9, adult ([place]) 09/05/2013  . Hx of colonic polyps 12/15/2012  . Vitamin B12 deficiency 11/03/2012  . Intrinsic asthma 03/23/2012  . Chronic diastolic heart failure ([place]) 02/20/2011  . OSA (obstructive sleep apnea) 09/17/2010  . URINARY URGENCY 01/08/2010  . CAD, NATIVE VESSEL 11/20/2008  . OSTEOARTHRITIS, KNEES, BILATERAL, SEVERE 06/14/2008  . Hyperlipidemia 05/10/2007  . Essential hypertension 01/18/2007  . HYPOKALEMIA 04/30/2006  . HX, PERSONAL, PEPTIC ULCER DISEASE 04/30/2006    Current Outpatient Prescriptions on File Prior to Visit  Medication Sig Dispense Refill  . Amino Acids-Protein Hydrolys (FEEDING SUPPLEMENT, PRO-STAT SUGAR FREE 64,) LIQD Take 30 mLs by mouth 3 (three) times daily with meals. 2700 mL 3  . aspirin (ECOTRIN LOW STRENGTH) 81 MG EC tablet Take 81  mg by mouth at bedtime.     Marland Kitchen atorvastatin (LIPITOR) 20 MG tablet Take 1 tablet (20 mg total) by mouth daily. 30 tablet 0  . budesonide-formoterol (SYMBICORT) 160-4.5 MCG/ACT inhaler one - two inhalations every 12 hours; gargle and spit after use (Patient taking differently: Inhale 2 puffs into the lungs 2 (two) times daily as needed  (shortness of breath). gargle and spit after use) 1 Inhaler 4  . carvedilol (COREG) 25 MG tablet take 1 tablet by mouth twice a day with meals 60 tablet 5  . Cod Liver Oil CAPS Take 1 capsule by mouth daily.     . cyanocobalamin (,VITAMIN B-12,) 1000 MCG/ML injection Inject 1 mL (1,000 mcg total) into the muscle every 30 (thirty) days. 2 mL 2  . diazepam (VALIUM) 5 MG tablet As needed for MRI 3 tablet 0  . diclofenac sodium (VOLTAREN) 1 % GEL Apply 2 g topically 4 (four) times daily. 100 g 11  . docusate sodium (COLACE) 100 MG capsule Take 2-3 capsules (200-300 mg total) by mouth daily. 90 capsule 11  . DULoxetine (CYMBALTA) 60 MG capsule Take 1 capsule (60 mg total) by mouth daily. 30 capsule 12  . Flaxseed, Linseed, (FLAX SEED OIL) 1000 MG CAPS Take 1,000 mg by mouth 2 (two) times daily.     . fluticasone (FLONASE) 50 MCG/ACT nasal spray Place 2 sprays into both nostrils daily. 16 g 0  . folic acid (FOLVITE) 517 MCG tablet Take 800 mcg by mouth at bedtime.     . furosemide (LASIX) 40 MG tablet Take 2 tablets (80 mg total) by mouth 3 (three) times daily. 360 tablet 5  . Lancets (ONETOUCH ULTRASOFT) lancets Use to help check blood sugars twice a day Dx E11.9 100 each 3  . lidocaine (GLYDO) 2 % jelly Apply 2 grams to affected area, if needed. 30 mL 11  . Multiple Vitamin (MULTIVITAMIN) tablet Take 1 tablet by mouth daily.      Marland Kitchen NEEDLE, DISP, 23 G (EASY TOUCH FLIPLOCK NEEDLES) 23G X 1-1/2" MISC Use to inject B12 once monthly. 2 each 0  . Omega-3 Fatty Acids (FISH OIL) 1000 MG CAPS Take 1,000 mg by mouth daily.     Marland Kitchen omeprazole (PRILOSEC) 40 MG capsule Take 1 capsule (40 mg total) by mouth 2 (two) times daily. 60 capsule 6  . potassium chloride SA (K-DUR,KLOR-CON) 20 MEQ tablet Take 3 tablets by mouth daily. 270 tablet 1  . pregabalin (LYRICA) 50 MG capsule Take 1 capsule (50 mg total) by mouth 2 (two) times daily. 60 capsule 3  . RA COL-RITE 100 MG capsule take 1 capsule by mouth twice a day if  needed for MILD CONSTIPATION 60 capsule 0  . spironolactone (ALDACTONE) 25 MG tablet Take 1 tablet (25 mg total) by mouth daily. 30 tablet 5  . SYRINGE-NEEDLE, DISP, 3 ML 23G X 1" 3 ML MISC Use with B12 to inject IM every 30 days 50 each 0  . topiramate (TOPAMAX) 100 MG tablet Take 1 tablet (100 mg total) by mouth 2 (two) times daily. 60 tablet 11  . albuterol (PROVENTIL HFA) 108 (90 BASE) MCG/ACT inhaler Inhale 2 puffs into the lungs every 4 (four) hours as needed for wheezing or shortness of breath. 1 Inhaler 4   No current facility-administered medications on file prior to visit.     Past Medical History:  Diagnosis Date  . Anemia   . Asthma   . CAD (coronary artery disease)   .  Carpal tunnel syndrome   . Cellulitis of both lower extremities 04/11/2015  . CHF (congestive heart failure) ([place])   . Colon polyp, hyperplastic 2007 & 2012  . Complication of anesthesia 1999   svt with renal calculi surgery, no problems since  . CTS (carpal tunnel syndrome)   . Diabetes mellitus   . Eczema   . Hyperlipidemia   . Hypertension   . Leg ulcer ([place]) 04/24/2015   Right lateral leg No evidence of an infection Monitor closely Keep edema controlled   . Meralgia paresthetica    Dr. [name]  . Morbid obesity ([place])   . Morbid obesity ([place])   . Neuropathy    toes and legs  . Osteoarthrosis, unspecified whether generalized or localized, lower leg   . PUD (peptic ulcer disease)   . Shortness of breath dyspnea    with exertion  . Type II or unspecified type diabetes mellitus without mention of complication, not stated as uncontrolled   . Unspecified hereditary and idiopathic peripheral neuropathy   . Vitamin B12 deficiency   . Wound cellulitis    right upper leg, healing well    Past Surgical History:  Procedure Laterality Date  . ABDOMINAL HYSTERECTOMY    . CARDIAC CATHETERIZATION  2002   non obstructive disease  . colonoscopy with polypectomy  2007 & 2012    hyperplastic ;Dr Watt Climes  .  COLONOSCOPY WITH PROPOFOL N/A 06/04/2015   Procedure: COLONOSCOPY WITH PROPOFOL;  Surgeon: Jerene Bears, MD;  Location: [facility];  Service: Gastroenterology;  Laterality: N/A;  . DILATION AND CURETTAGE OF UTERUS     multiple  . HEMORRHOID SURGERY    . renal calculi  12/1997   SVT with induction of anesthesia  . right knee arthroscopy    . TONSILLECTOMY AND ADENOIDECTOMY      Social History   Social History  . Marital status: Married    Spouse name: [name]  . Number of children: 1  . Years of education: BS   Occupational History  . Disabled Retired   Social History Main Topics  . Smoking status: Never Smoker  . Smokeless tobacco: Never Used  . Alcohol use No  . Drug use: No  . Sexual activity: Not on file   Other Topics Concern  . Not on file   Social History Narrative   Patient lives at home with her husband [name] Patient is retired and has a Conservation officer, nature.    Caffeine - some times.   Right handed.    Family History  Problem Relation Age of Onset  . Colon cancer Mother   . Prostate cancer Father   . Colon cancer Father   . Diabetes Maternal Aunt   . Breast cancer Maternal Aunt   . Diabetes Maternal Uncle   . Diabetes Paternal Aunt   . Stroke Paternal Aunt        > 65  . Heart disease Paternal Aunt   . Diabetes Paternal Uncle   . Breast cancer Maternal Aunt         X 2  . Breast cancer Cousin     Review of Systems  Constitutional: Negative for chills and fever.  HENT: Negative for congestion and postnasal drip.   Respiratory: Positive for cough (in morning), shortness of breath (same) and wheezing.   Cardiovascular: Positive for leg swelling. Negative for chest pain and palpitations.  Gastrointestinal:       No gerd  Endocrine: Positive for cold intolerance.  Neurological: Positive for dizziness (occ). Negative for light-headedness and headaches.       Objective:   Vitals:   08/14/16 1102  BP: 124/76  Pulse: 75  Resp: 18  Temp:  98 F (36.7 C)   Wt Readings from Last 3 Encounters:  08/14/16 (!) 351 lb (159.2 kg)  07/16/16 (!) 360 lb (163.3 kg)  06/02/16 (!) 360 lb 8 oz (163.5 kg)   Body mass index is 58.41 kg/m.   Physical Exam    Constitutional: Appears well-developed and well-nourished. No distress.  HENT:  Head: Normocephalic and atraumatic.  Neck: Neck supple. No tracheal deviation present. No thyromegaly present.  No cervical lymphadenopathy Cardiovascular: Normal rate, regular rhythm and normal heart sounds.   No murmur heard. No carotid bruit .   Edema - legs are wrapped and I did not unwrap them so it is difficult to quantify, but overall looks much better, no exposed skin redness Pulmonary/Chest: Effort normal and breath sounds normal. No respiratory distress. No has no wheezes. No rales.  Skin: Skin is warm and dry. Not diaphoretic.  erythema, warmth and nickel sided induration at shingles injection site right upper arm  Psychiatric: Normal mood and affect. Behavior is normal.      Assessment & Plan:    See Problem List for Assessment and Plan of chronic medical problems.

## 2016-08-14 ENCOUNTER — Other Ambulatory Visit (INDEPENDENT_AMBULATORY_CARE_PROVIDER_SITE_OTHER): Payer: Medicare Other

## 2016-08-14 ENCOUNTER — Ambulatory Visit (INDEPENDENT_AMBULATORY_CARE_PROVIDER_SITE_OTHER): Payer: Medicare Other | Admitting: Internal Medicine

## 2016-08-14 ENCOUNTER — Encounter: Payer: Self-pay | Admitting: Internal Medicine

## 2016-08-14 VITALS — BP 124/76 | HR 75 | Temp 98.0°F | Resp 18 | Wt 351.0 lb

## 2016-08-14 DIAGNOSIS — N183 Chronic kidney disease, stage 3 unspecified: Secondary | ICD-10-CM

## 2016-08-14 DIAGNOSIS — K219 Gastro-esophageal reflux disease without esophagitis: Secondary | ICD-10-CM

## 2016-08-14 DIAGNOSIS — E7800 Pure hypercholesterolemia, unspecified: Secondary | ICD-10-CM | POA: Diagnosis not present

## 2016-08-14 DIAGNOSIS — T8090XA Unspecified complication following infusion and therapeutic injection, initial encounter: Secondary | ICD-10-CM | POA: Diagnosis not present

## 2016-08-14 DIAGNOSIS — M25512 Pain in left shoulder: Secondary | ICD-10-CM | POA: Diagnosis not present

## 2016-08-14 DIAGNOSIS — I10 Essential (primary) hypertension: Secondary | ICD-10-CM | POA: Diagnosis not present

## 2016-08-14 DIAGNOSIS — E1140 Type 2 diabetes mellitus with diabetic neuropathy, unspecified: Secondary | ICD-10-CM

## 2016-08-14 DIAGNOSIS — Z6841 Body Mass Index (BMI) 40.0 and over, adult: Secondary | ICD-10-CM | POA: Diagnosis not present

## 2016-08-14 LAB — COMPREHENSIVE METABOLIC PANEL
ALBUMIN: 3.8 g/dL (ref 3.5–5.2)
ALK PHOS: 67 U/L (ref 39–117)
ALT: 8 U/L (ref 0–35)
AST: 14 U/L (ref 0–37)
BUN: 31 mg/dL — AB (ref 6–23)
CHLORIDE: 104 meq/L (ref 96–112)
CO2: 34 mEq/L — ABNORMAL HIGH (ref 19–32)
Calcium: 9.6 mg/dL (ref 8.4–10.5)
Creatinine, Ser: 1.39 mg/dL — ABNORMAL HIGH (ref 0.40–1.20)
GFR: 48.76 mL/min — AB (ref >60.00)
GLUCOSE: 96 mg/dL (ref 70–99)
POTASSIUM: 3.5 meq/L (ref 3.5–5.1)
SODIUM: 145 meq/L (ref 135–145)
TOTAL PROTEIN: 6.9 g/dL (ref 6.0–8.3)
Total Bilirubin: 0.5 mg/dL (ref 0.2–1.2)

## 2016-08-14 LAB — LIPID PANEL
Cholesterol: 141 mg/dL (ref 0–200)
HDL: 38.9 mg/dL — AB (ref >39.00)
LDL Cholesterol: 89 mg/dL (ref 0–99)
NonHDL: 101.8
TRIGLYCERIDES: 63 mg/dL (ref 0.0–149.0)
Total CHOL/HDL Ratio: 4
VLDL: 12.6 mg/dL (ref 0.0–40.0)

## 2016-08-14 LAB — HEMOGLOBIN A1C: HEMOGLOBIN A1C: 5.4 % (ref 4.6–6.5)

## 2016-08-14 MED ORDER — FUROSEMIDE 40 MG PO TABS: 120.0000 mg | ORAL_TABLET | Freq: Two times a day (BID) | ORAL | 5 refills | Status: DC

## 2016-08-14 NOTE — Assessment & Plan Note (Signed)
BP well controlled Current regimen effective and well tolerated Continue current medications at current doses cmp  

## 2016-08-14 NOTE — Assessment & Plan Note (Signed)
She deferred shingles vaccine 3 days ago. At the site of injection she has some pain, slight induration and redness. This is typical of the known side effects from the injection No evidence of cellulitis She will monitor the area closely, being off the redness increases or does not improve

## 2016-08-14 NOTE — Assessment & Plan Note (Signed)
She has been experiencing pain for just over 2 months since she fell She is doing physical therapy and occupational therapy at home If pain persists advised her to make an appointment with sports medicine for further evaluation

## 2016-08-14 NOTE — Assessment & Plan Note (Signed)
Check lipid panel  Continue daily statin Regular exercise and healthy diet encouraged  

## 2016-08-14 NOTE — Patient Instructions (Signed)
  Test(s) ordered today. Your results will be released to MyChart (or called to you) after review, usually within 72hours after test completion. If any changes need to be made, you will be notified at that same time.  All other Health Maintenance issues reviewed.   All recommended immunizations and age-appropriate screenings are up-to-date or discussed.  No immunizations administered today.   Medications reviewed and updated.  No changes recommended at this time.   Please followup in 6 months   

## 2016-08-14 NOTE — Assessment & Plan Note (Addendum)
Following with nephrology They are managing her Lasix/lymphedema CMP today

## 2016-08-14 NOTE — Assessment & Plan Note (Signed)
Check a1c Low sugar / carb diet Stressed regular exercise, weight loss  

## 2016-08-14 NOTE — Assessment & Plan Note (Signed)
She has lost some weight since she was here last. Congratulated. She will continue her efforts.

## 2016-08-14 NOTE — Assessment & Plan Note (Signed)
GERD controlled Continue daily medication  

## 2016-08-19 ENCOUNTER — Ambulatory Visit: Payer: Medicare Other | Admitting: Internal Medicine

## 2016-08-19 ENCOUNTER — Telehealth: Payer: Self-pay | Admitting: Internal Medicine

## 2016-08-19 NOTE — Telephone Encounter (Signed)
Physical therapist  with [facility]  [phone]  Need verbal to ok to continue pt, pt has not met all goals

## 2016-08-20 ENCOUNTER — Institutional Professional Consult (permissible substitution): Payer: Medicare Other | Admitting: Internal Medicine

## 2016-08-20 NOTE — Telephone Encounter (Signed)
LVM giving verbal orders per MD.

## 2016-08-26 ENCOUNTER — Telehealth: Payer: Self-pay | Admitting: Internal Medicine

## 2016-08-26 ENCOUNTER — Institutional Professional Consult (permissible substitution): Payer: Medicare Other | Admitting: Internal Medicine

## 2016-08-26 NOTE — Telephone Encounter (Signed)
Notified connie w/MD response...[name]

## 2016-08-26 NOTE — Telephone Encounter (Signed)
[facility]  [phone]   Need verbals for: Re certifying  pt for chronic pain PT  OT Skilled Nursing  Home health aid

## 2016-08-26 NOTE — Telephone Encounter (Signed)
Ok for verbals 

## 2016-08-26 NOTE — Telephone Encounter (Signed)
MD out of office pls advise on msg below.../lmb 

## 2016-08-27 NOTE — Telephone Encounter (Addendum)
Called Betsy inform gave verbal for PT yesterday w/Connie (see msg below)...[name]

## 2016-08-27 NOTE — Telephone Encounter (Signed)
Betsy with [facility] called and would like verbals for PT for 2week4 1week2  Please call back [phone]

## 2016-09-02 ENCOUNTER — Telehealth: Payer: Self-pay | Admitting: Internal Medicine

## 2016-09-02 NOTE — Telephone Encounter (Signed)
[facility]  [phone]   Need verbals for OT  1 week 4

## 2016-09-03 NOTE — Telephone Encounter (Signed)
ok 

## 2016-09-03 NOTE — Telephone Encounter (Signed)
Spoke with Will and gave verbal orders per MD

## 2016-09-05 ENCOUNTER — Other Ambulatory Visit: Payer: Self-pay | Admitting: Internal Medicine

## 2016-09-05 DIAGNOSIS — E785 Hyperlipidemia, unspecified: Secondary | ICD-10-CM

## 2016-09-08 ENCOUNTER — Other Ambulatory Visit: Payer: Self-pay | Admitting: Internal Medicine

## 2016-09-08 DIAGNOSIS — E785 Hyperlipidemia, unspecified: Secondary | ICD-10-CM

## 2016-09-08 NOTE — Telephone Encounter (Signed)
Patient called back in regard to this.  Looks like last refill request sent in was denied stating that patient no longer takes medication.  Patient states she does take this medication and is currently out.  Please follow up with patient in regard.

## 2016-09-08 NOTE — Telephone Encounter (Signed)
Please advise. It looks like it was stopped at discharge after hospital stay.

## 2016-09-09 ENCOUNTER — Telehealth: Payer: Self-pay | Admitting: Internal Medicine

## 2016-09-09 NOTE — Telephone Encounter (Signed)
She should take mucinex and continue the inhaler.  If no improvement she should come in to be evaluated.

## 2016-09-09 NOTE — Telephone Encounter (Signed)
April [name] from [facility] called stating that she was with the pt this morning and she is experiencing wheezing, congestion and a cough. Her inhaler has not helped with the wheezing. She would like to know what Dr Quay Burow would suggest?

## 2016-09-10 DIAGNOSIS — N183 Chronic kidney disease, stage 3 (moderate): Secondary | ICD-10-CM | POA: Diagnosis not present

## 2016-09-10 DIAGNOSIS — D631 Anemia in chronic kidney disease: Secondary | ICD-10-CM | POA: Diagnosis not present

## 2016-09-10 DIAGNOSIS — F339 Major depressive disorder, recurrent, unspecified: Secondary | ICD-10-CM | POA: Diagnosis not present

## 2016-09-10 DIAGNOSIS — M6281 Muscle weakness (generalized): Secondary | ICD-10-CM | POA: Diagnosis not present

## 2016-09-10 DIAGNOSIS — E1122 Type 2 diabetes mellitus with diabetic chronic kidney disease: Secondary | ICD-10-CM | POA: Diagnosis not present

## 2016-09-10 DIAGNOSIS — I130 Hypertensive heart and chronic kidney disease with heart failure and stage 1 through stage 4 chronic kidney disease, or unspecified chronic kidney disease: Secondary | ICD-10-CM | POA: Diagnosis not present

## 2016-09-10 DIAGNOSIS — I872 Venous insufficiency (chronic) (peripheral): Secondary | ICD-10-CM | POA: Diagnosis not present

## 2016-09-10 DIAGNOSIS — I5032 Chronic diastolic (congestive) heart failure: Secondary | ICD-10-CM | POA: Diagnosis not present

## 2016-09-10 DIAGNOSIS — Z794 Long term (current) use of insulin: Secondary | ICD-10-CM

## 2016-09-10 DIAGNOSIS — E1140 Type 2 diabetes mellitus with diabetic neuropathy, unspecified: Secondary | ICD-10-CM | POA: Diagnosis not present

## 2016-09-10 DIAGNOSIS — Z6841 Body Mass Index (BMI) 40.0 and over, adult: Secondary | ICD-10-CM | POA: Diagnosis not present

## 2016-09-10 DIAGNOSIS — Z9181 History of falling: Secondary | ICD-10-CM | POA: Diagnosis not present

## 2016-09-10 DIAGNOSIS — Z7982 Long term (current) use of aspirin: Secondary | ICD-10-CM

## 2016-09-10 NOTE — Telephone Encounter (Signed)
Spoke with pt, she would like to come in tomorrow and see Dr Quay Burow.

## 2016-09-10 NOTE — Progress Notes (Signed)
Subjective:    Patient ID: Francena Hanly, female    DOB: 15-Aug-1950, 66 y.o.   MRN: [id number]  HPI She is here for an acute visit for cold symptoms.   Her symptoms started last week.    She is experiencing a fever/chills, scratchy throat, burning throat, ear pain, cough, wheezing, nasal and chest congestion, SOB, chest pain, some mild diarrhea and lightheadedness.  She denies sinus pain, headaches or abdominal pain.    She has tried using the nasal spray and drinking hot tea with some improvement in symptoms.   Medications and allergies reviewed with patient and updated if appropriate.  Patient Active Problem List   Diagnosis Date Noted  . Left shoulder pain 08/14/2016  . Injection site reaction 08/14/2016  . Meralgia paresthetica 07/16/2016  . Chronic left-sided low back pain with left-sided sciatica 06/02/2016  . Venous stasis ulcer ([place]) 05/13/2016  . Cellulitis and abscess of foot 03/28/2016  . Whole body pain 03/20/2016  . Peripheral polyneuropathy 03/20/2016  . Diabetic peripheral neuropathy ([place]) 03/20/2016  . Osteopenia 01/11/2016  . CKD (chronic kidney disease) stage 3, GFR 30-59 ml/min 01/02/2016  . Infected blister of right leg 01/02/2016  . Hand paresthesia 07/18/2015  . Carpal tunnel syndrome 06/07/2015  . Family history of colon cancer   . Diabetes mellitus with neurological manifestations ([place]) 04/18/2015  . Bilateral leg edema 04/11/2015  . Cellulitis of leg, right 04/11/2015  . Abnormality of gait 01/03/2015  . Hereditary and idiopathic peripheral neuropathy 01/03/2015  . GERD (gastroesophageal reflux disease) 06/17/2014  . Morbid obesity with BMI of 50.0-59.9, adult ([place]) 09/05/2013  . Hx of colonic polyps 12/15/2012  . Vitamin B12 deficiency 11/03/2012  . Intrinsic asthma 03/23/2012  . Chronic diastolic heart failure ([place]) 02/20/2011  . OSA (obstructive sleep apnea) 09/17/2010  . URINARY URGENCY 01/08/2010  . CAD, NATIVE VESSEL 11/20/2008  .  OSTEOARTHRITIS, KNEES, BILATERAL, SEVERE 06/14/2008  . Hyperlipidemia 05/10/2007  . Essential hypertension 01/18/2007  . HYPOKALEMIA 04/30/2006  . HX, PERSONAL, PEPTIC ULCER DISEASE 04/30/2006    Current Outpatient Prescriptions on File Prior to Visit  Medication Sig Dispense Refill  . Amino Acids-Protein Hydrolys (FEEDING SUPPLEMENT, PRO-STAT SUGAR FREE 64,) LIQD Take 30 mLs by mouth 3 (three) times daily with meals. 2700 mL 3  . aspirin (ECOTRIN LOW STRENGTH) 81 MG EC tablet Take 81 mg by mouth at bedtime.     Marland Kitchen atorvastatin (LIPITOR) 20 MG tablet Take 1 tablet (20 mg total) by mouth daily. 30 tablet 0  . budesonide-formoterol (SYMBICORT) 160-4.5 MCG/ACT inhaler one - two inhalations every 12 hours; gargle and spit after use (Patient taking differently: Inhale 2 puffs into the lungs 2 (two) times daily as needed (shortness of breath). gargle and spit after use) 1 Inhaler 4  . carvedilol (COREG) 25 MG tablet take 1 tablet by mouth twice a day with meals 60 tablet 5  . Cod Liver Oil CAPS Take 1 capsule by mouth daily.     . cyanocobalamin (,VITAMIN B-12,) 1000 MCG/ML injection Inject 1 mL (1,000 mcg total) into the muscle every 30 (thirty) days. 2 mL 2  . diazepam (VALIUM) 5 MG tablet As needed for MRI 3 tablet 0  . diclofenac sodium (VOLTAREN) 1 % GEL Apply 2 g topically 4 (four) times daily. 100 g 11  . docusate sodium (COLACE) 100 MG capsule Take 2-3 capsules (200-300 mg total) by mouth daily. 90 capsule 11  . DULoxetine (CYMBALTA) 60 MG capsule Take 1 capsule (60  mg total) by mouth daily. 30 capsule 12  . Flaxseed, Linseed, (FLAX SEED OIL) 1000 MG CAPS Take 1,000 mg by mouth 2 (two) times daily.     . fluticasone (FLONASE) 50 MCG/ACT nasal spray Place 2 sprays into both nostrils daily. 16 g 0  . folic acid (FOLVITE) 161 MCG tablet Take 800 mcg by mouth at bedtime.     . furosemide (LASIX) 40 MG tablet Take 3 tablets (120 mg total) by mouth 2 (two) times daily. 360 tablet 5  . Lancets  (ONETOUCH ULTRASOFT) lancets Use to help check blood sugars twice a day Dx E11.9 100 each 3  . lidocaine (GLYDO) 2 % jelly Apply 2 grams to affected area, if needed. 30 mL 11  . Multiple Vitamin (MULTIVITAMIN) tablet Take 1 tablet by mouth daily.      Marland Kitchen NEEDLE, DISP, 23 G (EASY TOUCH FLIPLOCK NEEDLES) 23G X 1-1/2" MISC Use to inject B12 once monthly. 2 each 0  . Omega-3 Fatty Acids (FISH OIL) 1000 MG CAPS Take 1,000 mg by mouth daily.     Marland Kitchen omeprazole (PRILOSEC) 40 MG capsule Take 1 capsule (40 mg total) by mouth 2 (two) times daily. 60 capsule 6  . potassium chloride SA (K-DUR,KLOR-CON) 20 MEQ tablet Take 3 tablets by mouth daily. 270 tablet 1  . pregabalin (LYRICA) 50 MG capsule Take 1 capsule (50 mg total) by mouth 2 (two) times daily. 60 capsule 3  . RA COL-RITE 100 MG capsule take 1 capsule by mouth twice a day if needed for MILD CONSTIPATION 60 capsule 0  . simvastatin (ZOCOR) 40 MG tablet take 1 tablet by mouth at bedtime 90 tablet 3  . spironolactone (ALDACTONE) 25 MG tablet Take 1 tablet (25 mg total) by mouth daily. 30 tablet 5  . SYRINGE-NEEDLE, DISP, 3 ML 23G X 1" 3 ML MISC Use with B12 to inject IM every 30 days 50 each 0  . topiramate (TOPAMAX) 100 MG tablet Take 1 tablet (100 mg total) by mouth 2 (two) times daily. 60 tablet 11  . albuterol (PROVENTIL HFA) 108 (90 BASE) MCG/ACT inhaler Inhale 2 puffs into the lungs every 4 (four) hours as needed for wheezing or shortness of breath. 1 Inhaler 4   No current facility-administered medications on file prior to visit.     Past Medical History:  Diagnosis Date  . Anemia   . Asthma   . CAD (coronary artery disease)   . Carpal tunnel syndrome   . Cellulitis of both lower extremities 04/11/2015  . CHF (congestive heart failure) ([place])   . Colon polyp, hyperplastic 2007 & 2012  . Complication of anesthesia 1999   svt with renal calculi surgery, no problems since  . CTS (carpal tunnel syndrome)   . Diabetes mellitus   . Eczema   .  Hyperlipidemia   . Hypertension   . Leg ulcer ([place]) 04/24/2015   Right lateral leg No evidence of an infection Monitor closely Keep edema controlled   . Meralgia paresthetica    Dr. [name]  . Morbid obesity ([place])   . Morbid obesity ([place])   . Neuropathy    toes and legs  . Osteoarthrosis, unspecified whether generalized or localized, lower leg   . PUD (peptic ulcer disease)   . Shortness of breath dyspnea    with exertion  . Type II or unspecified type diabetes mellitus without mention of complication, not stated as uncontrolled   . Unspecified hereditary and idiopathic peripheral neuropathy   .  Vitamin B12 deficiency   . Wound cellulitis    right upper leg, healing well    Past Surgical History:  Procedure Laterality Date  . ABDOMINAL HYSTERECTOMY    . CARDIAC CATHETERIZATION  2002   non obstructive disease  . colonoscopy with polypectomy  2007 & 2012    hyperplastic ;Dr Watt Climes  . COLONOSCOPY WITH PROPOFOL N/A 06/04/2015   Procedure: COLONOSCOPY WITH PROPOFOL;  Surgeon: Jerene Bears, MD;  Location: [facility];  Service: Gastroenterology;  Laterality: N/A;  . DILATION AND CURETTAGE OF UTERUS     multiple  . HEMORRHOID SURGERY    . renal calculi  12/1997   SVT with induction of anesthesia  . right knee arthroscopy    . TONSILLECTOMY AND ADENOIDECTOMY      Social History   Social History  . Marital status: Married    Spouse name: [name]  . Number of children: 1  . Years of education: BS   Occupational History  . Disabled Retired   Social History Main Topics  . Smoking status: Never Smoker  . Smokeless tobacco: Never Used  . Alcohol use No  . Drug use: No  . Sexual activity: Not on file   Other Topics Concern  . Not on file   Social History Narrative   Patient lives at home with her husband [name] Patient is retired and has a Conservation officer, nature.    Caffeine - some times.   Right handed.    Family History  Problem Relation Age of Onset  . Colon cancer  Mother   . Prostate cancer Father   . Colon cancer Father   . Diabetes Maternal Aunt   . Breast cancer Maternal Aunt   . Diabetes Maternal Uncle   . Diabetes Paternal Aunt   . Stroke Paternal Aunt        > 65  . Heart disease Paternal Aunt   . Diabetes Paternal Uncle   . Breast cancer Maternal Aunt         X 2  . Breast cancer Cousin     Review of Systems  Constitutional: Positive for chills and fever.  HENT: Positive for congestion, ear pain and sore throat. Negative for sinus pain and sinus pressure.   Respiratory: Positive for cough (productive of yellow mucus), shortness of breath and wheezing.   Cardiovascular: Positive for chest pain.  Gastrointestinal: Positive for diarrhea. Negative for abdominal pain and nausea.  Neurological: Positive for light-headedness. Negative for headaches.       Objective:   Vitals:   09/11/16 1056  BP: 128/62  Pulse: 70  Resp: 18  Temp: 98.1 F (36.7 C)  SpO2: 98%   There were no vitals filed for this visit. There is no height or weight on file to calculate BMI.  Wt Readings from Last 3 Encounters:  08/14/16 (!) 351 lb (159.2 kg)  07/16/16 (!) 360 lb (163.3 kg)  06/02/16 (!) 360 lb 8 oz (163.5 kg)     Physical Exam GENERAL APPEARANCE: Appears stated age, well appearing, NAD EYES: conjunctiva clear, no icterus HEENT: bilateral tympanic membranes and ear canals normal, oropharynx with mild erythema, no thyromegaly, trachea midline, no cervical or supraclavicular lymphadenopathy LUNGS: Uunlabored breathing, good air entry bilaterally, diffuse expiratory wheeze, no crackles HEART: Normal S1,S2 without murmurs EXTREMITIES: bilateral legs wrapped due to chronic lymphedema        Assessment & Plan:   See Problem List for Assessment and Plan of chronic medical problems.

## 2016-09-11 ENCOUNTER — Ambulatory Visit (INDEPENDENT_AMBULATORY_CARE_PROVIDER_SITE_OTHER)
Admission: RE | Admit: 2016-09-11 | Discharge: 2016-09-11 | Disposition: A | Discharge status: Home / Self Care | Payer: Medicare Other | Source: Ambulatory Visit | Attending: Internal Medicine | Admitting: Internal Medicine

## 2016-09-11 ENCOUNTER — Ambulatory Visit: Payer: Medicare Other

## 2016-09-11 ENCOUNTER — Ambulatory Visit: Payer: Medicare Other | Admitting: Family Medicine

## 2016-09-11 ENCOUNTER — Ambulatory Visit (INDEPENDENT_AMBULATORY_CARE_PROVIDER_SITE_OTHER): Payer: Medicare Other | Admitting: Internal Medicine

## 2016-09-11 ENCOUNTER — Encounter: Payer: Self-pay | Admitting: Internal Medicine

## 2016-09-11 VITALS — BP 128/62 | HR 70 | Temp 98.1°F | Resp 18

## 2016-09-11 DIAGNOSIS — R062 Wheezing: Secondary | ICD-10-CM | POA: Insufficient documentation

## 2016-09-11 DIAGNOSIS — R059 Cough, unspecified: Secondary | ICD-10-CM

## 2016-09-11 DIAGNOSIS — R05 Cough: Secondary | ICD-10-CM

## 2016-09-11 MED ORDER — DOXYCYCLINE HYCLATE 100 MG PO TABS: 100.0000 mg | ORAL_TABLET | Freq: Two times a day (BID) | ORAL | 0 refills | Status: DC

## 2016-09-11 MED ORDER — METHYLPREDNISOLONE 4 MG PO TBPK: ORAL_TABLET | ORAL | 0 refills | Status: DC

## 2016-09-11 MED ORDER — METHYLPREDNISOLONE ACETATE 80 MG/ML IJ SUSP
80.0000 mg | Freq: Once | INTRAMUSCULAR | Status: AC
  Administered 2016-09-11: 80 mg via INTRAMUSCULAR

## 2016-09-11 MED ORDER — HYDROCOD POLST-CPM POLST ER 10-8 MG/5ML PO SUER: 5.0000 mL | Freq: Two times a day (BID) | ORAL | 0 refills | Status: DC | PRN

## 2016-09-11 MED ORDER — IPRATROPIUM-ALBUTEROL 0.5-2.5 (3) MG/3ML IN SOLN
3.0000 mL | Freq: Once | RESPIRATORY_TRACT | Status: AC
  Administered 2016-09-11: 3 mL via RESPIRATORY_TRACT

## 2016-09-11 MED ORDER — ALBUTEROL SULFATE HFA 108 (90 BASE) MCG/ACT IN AERS: 2.0000 | INHALATION_SPRAY | RESPIRATORY_TRACT | 4 refills | Status: DC | PRN

## 2016-09-11 NOTE — Assessment & Plan Note (Signed)
Symptoms consistent with bronchitis, will rule out pneumonia with a  CXR Neb treatment today Dep-medrol 80 mg Im x 1 for wheezing Medrol dose pak starting tomorrow Doxycycline x 10 days Albuterol inhaler Q 4 hr prns tussionex cough syrup Continue nasal spray  Call if no improvement

## 2016-09-11 NOTE — Patient Instructions (Addendum)
  Have a chest x-ray today.  You had a nebulizer treatment today and a steroid injection.   Take the antibiotic, steroid taper (start tomorrow) and inhaler as prescribed.  A cough medication was also prescribed.    Continue to use the nasal spray.  Continued increased rest and fluids.    Call if no improvement

## 2016-09-18 ENCOUNTER — Telehealth: Payer: Self-pay | Admitting: Internal Medicine

## 2016-09-18 DIAGNOSIS — I10 Essential (primary) hypertension: Secondary | ICD-10-CM

## 2016-09-21 NOTE — Telephone Encounter (Signed)
Not on Current Med List, Please Advise.

## 2016-09-21 NOTE — Telephone Encounter (Signed)
Pt called regarding this  Pt informed of it not being on current  Med list

## 2016-09-22 NOTE — Telephone Encounter (Signed)
Pt called and she was informed of medication being denied and the reason, informed her she was taken off of this at the hospital. She would like to know if she is supposed to be taking a high BP med in place of this or if her BP is controlled.  Please advise

## 2016-09-22 NOTE — Telephone Encounter (Signed)
Her BP has been well controlled when she has been seen - no additional medication needed

## 2016-09-23 NOTE — Telephone Encounter (Addendum)
Spoke with pt to inform. Pt states she has been taking the hydralazine but will monitor her BP since she is not supposed to be taking the medication.

## 2016-10-13 ENCOUNTER — Ambulatory Visit: Payer: Medicare Other

## 2016-10-14 ENCOUNTER — Other Ambulatory Visit: Payer: Self-pay | Admitting: Internal Medicine

## 2016-10-14 DIAGNOSIS — I10 Essential (primary) hypertension: Secondary | ICD-10-CM

## 2016-10-15 NOTE — Telephone Encounter (Signed)
She has not been on the verapamil. Ok to refuse.  She is on spironolactone.

## 2016-10-15 NOTE — Telephone Encounter (Signed)
Not on current med list. Please advise 

## 2016-10-15 NOTE — Telephone Encounter (Signed)
Patient has called in regard.  States she was told her current medication is spironolactone 25mg .  Patient states this is listed as a medication she is allergic too.  Patient states she did not know the changes the hospital had made.  Patient requesting a follow up call in regard to discuss. Can reach patient at [phone].

## 2016-10-16 MED ORDER — SPIRONOLACTONE 25 MG PO TABS: 25.0000 mg | ORAL_TABLET | Freq: Every day | ORAL | 5 refills | Status: DC

## 2016-10-16 NOTE — Telephone Encounter (Signed)
She should stay on the spironolactone.

## 2016-10-16 NOTE — Telephone Encounter (Signed)
Pt states spironolactone (ALDACTONE) 25 MG tablet Is on her allergy list, she would like to know if [facility] still wants her to take it or put her back on Verapamil. Please advise

## 2016-10-16 NOTE — Telephone Encounter (Signed)
Spoke with pt to inform. Rx sent to [facility]

## 2016-10-20 ENCOUNTER — Telehealth: Payer: Self-pay | Admitting: Internal Medicine

## 2016-10-20 NOTE — Telephone Encounter (Signed)
I called [facility] to get more information and spoke with Lovena Le and she states she does need to be seen by pulmonary.

## 2016-10-26 ENCOUNTER — Telehealth: Payer: Self-pay | Admitting: Internal Medicine

## 2016-10-26 MED ORDER — NYSTATIN 100000 UNIT/GM EX CREA: 1.0000 "application " | TOPICAL_CREAM | Freq: Two times a day (BID) | CUTANEOUS | 2 refills | Status: DC

## 2016-10-26 NOTE — Telephone Encounter (Signed)
Please advise 

## 2016-10-26 NOTE — Telephone Encounter (Signed)
Called pt, LVM.   

## 2016-10-26 NOTE — Telephone Encounter (Signed)
Sound like a fungal/yeast infection -- can try nystatin cream - rx sent to pharmacy

## 2016-10-26 NOTE — Telephone Encounter (Signed)
Patient called back.  Gave Dr. Quay Burow response.

## 2016-10-26 NOTE — Telephone Encounter (Signed)
Marlowe Kays the nurse with [facility] called. She stated after the patient had the shingrix injection. She now has a rash like area under both breast. The patient just states it hurts. The nurse says she believe it looks like a fungi infection. Please follow up with the nurse. Thank you.   [place]

## 2016-10-26 NOTE — Addendum Note (Signed)
Addended by: [name] on: 10/26/2016 12:44 PM   Modules accepted: Orders

## 2016-10-27 ENCOUNTER — Institutional Professional Consult (permissible substitution): Payer: Medicare Other | Admitting: Internal Medicine

## 2016-11-02 ENCOUNTER — Other Ambulatory Visit: Payer: Self-pay | Admitting: Internal Medicine

## 2016-11-03 ENCOUNTER — Other Ambulatory Visit: Payer: Self-pay | Admitting: Emergency Medicine

## 2016-11-03 MED ORDER — CYANOCOBALAMIN 1000 MCG/ML IJ SOLN: INTRAMUSCULAR | 2 refills | Status: DC

## 2016-11-06 ENCOUNTER — Ambulatory Visit (INDEPENDENT_AMBULATORY_CARE_PROVIDER_SITE_OTHER): Payer: Medicare Other | Admitting: Emergency Medicine

## 2016-11-06 DIAGNOSIS — Z23 Encounter for immunization: Secondary | ICD-10-CM

## 2016-11-06 DIAGNOSIS — D519 Vitamin B12 deficiency anemia, unspecified: Secondary | ICD-10-CM | POA: Diagnosis not present

## 2016-11-06 DIAGNOSIS — D631 Anemia in chronic kidney disease: Secondary | ICD-10-CM | POA: Diagnosis not present

## 2016-11-06 DIAGNOSIS — N183 Chronic kidney disease, stage 3 (moderate): Secondary | ICD-10-CM | POA: Diagnosis not present

## 2016-11-06 DIAGNOSIS — Z7982 Long term (current) use of aspirin: Secondary | ICD-10-CM

## 2016-11-06 DIAGNOSIS — Z6841 Body Mass Index (BMI) 40.0 and over, adult: Secondary | ICD-10-CM | POA: Diagnosis not present

## 2016-11-06 DIAGNOSIS — Z794 Long term (current) use of insulin: Secondary | ICD-10-CM

## 2016-11-06 DIAGNOSIS — I130 Hypertensive heart and chronic kidney disease with heart failure and stage 1 through stage 4 chronic kidney disease, or unspecified chronic kidney disease: Secondary | ICD-10-CM | POA: Diagnosis not present

## 2016-11-06 DIAGNOSIS — E1122 Type 2 diabetes mellitus with diabetic chronic kidney disease: Secondary | ICD-10-CM | POA: Diagnosis not present

## 2016-11-06 DIAGNOSIS — L304 Erythema intertrigo: Secondary | ICD-10-CM | POA: Diagnosis not present

## 2016-11-06 DIAGNOSIS — F339 Major depressive disorder, recurrent, unspecified: Secondary | ICD-10-CM | POA: Diagnosis not present

## 2016-11-06 DIAGNOSIS — I5032 Chronic diastolic (congestive) heart failure: Secondary | ICD-10-CM | POA: Diagnosis not present

## 2016-11-06 DIAGNOSIS — E1140 Type 2 diabetes mellitus with diabetic neuropathy, unspecified: Secondary | ICD-10-CM | POA: Diagnosis not present

## 2016-11-06 DIAGNOSIS — Z9181 History of falling: Secondary | ICD-10-CM

## 2016-11-06 DIAGNOSIS — I872 Venous insufficiency (chronic) (peripheral): Secondary | ICD-10-CM | POA: Diagnosis not present

## 2016-11-10 ENCOUNTER — Ambulatory Visit (INDEPENDENT_AMBULATORY_CARE_PROVIDER_SITE_OTHER): Payer: Medicare Other | Admitting: Sports Medicine

## 2016-11-10 ENCOUNTER — Encounter: Payer: Self-pay | Admitting: Sports Medicine

## 2016-11-10 DIAGNOSIS — B351 Tinea unguium: Secondary | ICD-10-CM

## 2016-11-10 DIAGNOSIS — M79675 Pain in left toe(s): Secondary | ICD-10-CM

## 2016-11-10 DIAGNOSIS — M2142 Flat foot [pes planus] (acquired), left foot: Secondary | ICD-10-CM

## 2016-11-10 DIAGNOSIS — I890 Lymphedema, not elsewhere classified: Secondary | ICD-10-CM | POA: Diagnosis not present

## 2016-11-10 DIAGNOSIS — E1142 Type 2 diabetes mellitus with diabetic polyneuropathy: Secondary | ICD-10-CM | POA: Diagnosis not present

## 2016-11-10 DIAGNOSIS — M79674 Pain in right toe(s): Secondary | ICD-10-CM | POA: Diagnosis not present

## 2016-11-10 DIAGNOSIS — M2141 Flat foot [pes planus] (acquired), right foot: Secondary | ICD-10-CM

## 2016-11-10 MED ORDER — CLOTRIMAZOLE 1 % EX SOLN: 1.0000 "application " | Freq: Two times a day (BID) | CUTANEOUS | 5 refills | Status: DC

## 2016-11-10 NOTE — Progress Notes (Signed)
Subjective: April [name] is a 66 y.o. female patient with history of diabetes who returns to office today complaining of long, painful nails while ambulating in shoes; unable to trim. Patient states that the glucose reading this morning was 124. Last A1c 5.4. States that she gets pain at right 5th toe sometimes in shoes and in between toes feel like a paper cut. No other issues.   Patient Active Problem List   Diagnosis Date Noted  . Wheeze 09/11/2016  . Cough 09/11/2016  . Left shoulder pain 08/14/2016  . Injection site reaction 08/14/2016  . Meralgia paresthetica 07/16/2016  . Chronic left-sided low back pain with left-sided sciatica 06/02/2016  . Venous stasis ulcer ([place]) 05/13/2016  . Cellulitis and abscess of foot 03/28/2016  . Whole body pain 03/20/2016  . Peripheral polyneuropathy 03/20/2016  . Diabetic peripheral neuropathy ([place]) 03/20/2016  . Osteopenia 01/11/2016  . CKD (chronic kidney disease) stage 3, GFR 30-59 ml/min (HCC) 01/02/2016  . Infected blister of right leg 01/02/2016  . Hand paresthesia 07/18/2015  . Carpal tunnel syndrome 06/07/2015  . Family history of colon cancer   . Diabetes mellitus with neurological manifestations ([place]) 04/18/2015  . Bilateral leg edema 04/11/2015  . Cellulitis of leg, right 04/11/2015  . Abnormality of gait 01/03/2015  . Hereditary and idiopathic peripheral neuropathy 01/03/2015  . GERD (gastroesophageal reflux disease) 06/17/2014  . Morbid obesity with BMI of 50.0-59.9, adult ([place]) 09/05/2013  . Hx of colonic polyps 12/15/2012  . Vitamin B12 deficiency 11/03/2012  . Intrinsic asthma 03/23/2012  . Chronic diastolic heart failure ([place]) 02/20/2011  . OSA (obstructive sleep apnea) 09/17/2010  . URINARY URGENCY 01/08/2010  . CAD, NATIVE VESSEL 11/20/2008  . OSTEOARTHRITIS, KNEES, BILATERAL, SEVERE 06/14/2008  . Hyperlipidemia 05/10/2007  . Essential hypertension 01/18/2007  . HYPOKALEMIA 04/30/2006  . HX, PERSONAL, PEPTIC ULCER  DISEASE 04/30/2006   Current Outpatient Prescriptions on File Prior to Visit  Medication Sig Dispense Refill  . albuterol (PROVENTIL HFA) 108 (90 Base) MCG/ACT inhaler Inhale 2 puffs into the lungs every 4 (four) hours as needed for wheezing or shortness of breath. 1 Inhaler 4  . Amino Acids-Protein Hydrolys (FEEDING SUPPLEMENT, PRO-STAT SUGAR FREE 64,) LIQD Take 30 mLs by mouth 3 (three) times daily with meals. 2700 mL 3  . aspirin (ECOTRIN LOW STRENGTH) 81 MG EC tablet Take 81 mg by mouth at bedtime.     Marland Kitchen atorvastatin (LIPITOR) 20 MG tablet Take 1 tablet (20 mg total) by mouth daily. 30 tablet 0  . budesonide-formoterol (SYMBICORT) 160-4.5 MCG/ACT inhaler one - two inhalations every 12 hours; gargle and spit after use (Patient taking differently: Inhale 2 puffs into the lungs 2 (two) times daily as needed (shortness of breath). gargle and spit after use) 1 Inhaler 4  . carvedilol (COREG) 25 MG tablet take 1 tablet by mouth twice a day with meals 60 tablet 5  . chlorpheniramine-HYDROcodone (TUSSIONEX PENNKINETIC ER) 10-8 MG/5ML SUER Take 5 mLs by mouth every 12 (twelve) hours as needed for cough. 115 mL 0  . Cod Liver Oil CAPS Take 1 capsule by mouth daily.     . cyanocobalamin (,VITAMIN B-12,) 1000 MCG/ML injection INJECT 1 ML INTO MUSCLE EVERY 30 DAYS 2 mL 2  . diazepam (VALIUM) 5 MG tablet As needed for MRI 3 tablet 0  . diclofenac sodium (VOLTAREN) 1 % GEL Apply 2 g topically 4 (four) times daily. 100 g 11  . docusate sodium (COLACE) 100 MG capsule Take 2-3 capsules (200-300 mg  total) by mouth daily. 90 capsule 11  . DULoxetine (CYMBALTA) 60 MG capsule Take 1 capsule (60 mg total) by mouth daily. 30 capsule 12  . Flaxseed, Linseed, (FLAX SEED OIL) 1000 MG CAPS Take 1,000 mg by mouth 2 (two) times daily.     . fluticasone (FLONASE) 50 MCG/ACT nasal spray Place 2 sprays into both nostrils daily. 16 g 0  . folic acid (FOLVITE) 297 MCG tablet Take 800 mcg by mouth at bedtime.     . furosemide  (LASIX) 40 MG tablet Take 3 tablets (120 mg total) by mouth 2 (two) times daily. 360 tablet 5  . Lancets (ONETOUCH ULTRASOFT) lancets Use to help check blood sugars twice a day Dx E11.9 100 each 3  . lidocaine (GLYDO) 2 % jelly Apply 2 grams to affected area, if needed. 30 mL 11  . Multiple Vitamin (MULTIVITAMIN) tablet Take 1 tablet by mouth daily.      Marland Kitchen NEEDLE, DISP, 23 G (EASY TOUCH FLIPLOCK NEEDLES) 23G X 1-1/2" MISC Use to inject B12 once monthly. 2 each 0  . nystatin cream (MYCOSTATIN) Apply 1 application topically 2 (two) times daily. 30 g 2  . Omega-3 Fatty Acids (FISH OIL) 1000 MG CAPS Take 1,000 mg by mouth daily.     Marland Kitchen omeprazole (PRILOSEC) 40 MG capsule Take 1 capsule (40 mg total) by mouth 2 (two) times daily. 60 capsule 6  . potassium chloride SA (K-DUR,KLOR-CON) 20 MEQ tablet Take 3 tablets by mouth daily. 270 tablet 1  . pregabalin (LYRICA) 50 MG capsule Take 1 capsule (50 mg total) by mouth 2 (two) times daily. 60 capsule 3  . RA COL-RITE 100 MG capsule take 1 capsule by mouth twice a day if needed for MILD CONSTIPATION 60 capsule 0  . simvastatin (ZOCOR) 40 MG tablet take 1 tablet by mouth at bedtime 90 tablet 3  . spironolactone (ALDACTONE) 25 MG tablet Take 1 tablet (25 mg total) by mouth daily. 30 tablet 5  . SYRINGE-NEEDLE, DISP, 3 ML 23G X 1" 3 ML MISC Use with B12 to inject IM every 30 days 50 each 0  . topiramate (TOPAMAX) 100 MG tablet Take 1 tablet (100 mg total) by mouth 2 (two) times daily. 60 tablet 11   No current facility-administered medications on file prior to visit.    Allergies  Allergen Reactions  . Penicillins Rash    She was told not to take it anymore. Has patient had a PCN reaction causing immediate rash, facial/tongue/throat swelling, SOB or lightheadedness with hypotension: Yes Has patient had a PCN reaction causing severe rash involving mucus membranes or skin necrosis: No Has patient had a PCN reaction that required hospitalization: No Has  patient had a PCN reaction occurring within the last 10 years: No If all of the above answers are "NO", then may proceed with Cephalosporin use.'  . Sulfonamide Derivatives Anaphylaxis    REACTION: internal "burning"  . Benazepril Hcl Other (See Comments)    Patient unsure of reaction.  . Hydrochlorothiazide W-Triamterene Other (See Comments)    Hypokalemia  . Metronidazole Other (See Comments)    Patient unsure of reaction.  Marland Kitchen Spironolactone     "kidney problems"  . Torsemide Other (See Comments)    Patient unsure of reaction.  . Valsartan Other (See Comments)    Patient unsure of reaction.    Recent Results (from the past 2160 hour(s))  Comprehensive metabolic panel     Status: Abnormal   Collection Time: 08/14/16 12:15  PM  Result Value Ref Range   Sodium 145 135 - 145 mEq/L   Potassium 3.5 3.5 - 5.1 mEq/L   Chloride 104 96 - 112 mEq/L   CO2 34 (H) 19 - 32 mEq/L   Glucose, Bld 96 70 - 99 mg/dL   BUN 31 (H) 6 - 23 mg/dL   Creatinine, Ser 1.39 (H) 0.40 - 1.20 mg/dL   Total Bilirubin 0.5 0.2 - 1.2 mg/dL   Alkaline Phosphatase 67 39 - 117 U/L   AST 14 0 - 37 U/L   ALT 8 0 - 35 U/L   Total Protein 6.9 6.0 - 8.3 g/dL   Albumin 3.8 3.5 - 5.2 g/dL   Calcium 9.6 8.4 - 10.5 mg/dL   GFR 48.76 (L) >60.00 mL/min  Hemoglobin A1c     Status: None   Collection Time: 08/14/16 12:15 PM  Result Value Ref Range   Hgb A1c MFr Bld 5.4 4.6 - 6.5 %    Comment: Glycemic Control Guidelines for People with Diabetes:Non Diabetic:  <6%Goal of Therapy: <7%Additional Action Suggested:  >8%   Lipid panel     Status: Abnormal   Collection Time: 08/14/16 12:15 PM  Result Value Ref Range   Cholesterol 141 0 - 200 mg/dL    Comment: ATP III Classification       Desirable:  < 200 mg/dL               Borderline High:  200 - 239 mg/dL          High:  > = 240 mg/dL   Triglycerides 63.0 0.0 - 149.0 mg/dL    Comment: Normal:  <150 mg/dLBorderline High:  150 - 199 mg/dL   HDL 38.90 (L) >39.00 mg/dL   VLDL  12.6 0.0 - 40.0 mg/dL   LDL Cholesterol 89 0 - 99 mg/dL   Total CHOL/HDL Ratio 4     Comment:                Men          Women1/2 Average Risk     3.4          3.3Average Risk          5.0          4.42X Average Risk          9.6          7.13X Average Risk          15.0          11.0                       NonHDL 101.80     Comment: NOTE:  Non-HDL goal should be 30 mg/dL higher than patient's LDL goal (i.e. LDL goal of < 70 mg/dL, would have non-HDL goal of < 100 mg/dL)    Objective: General: Patient is awake, alert, and oriented x 3 and in no acute distress.  Integument: Skin is warm, dry and supple bilateral. Nails are tender, long, thickened and dystrophic with subungual debris, consistent with onychomycosis, 1-5 bilateral. No signs of infection. No open lesions or preulcerative lesions present bilateral. Unna boots on legs bilateral patient goes to wound care center. Minimal Callus to heels bilateral and nothing in between toes. Remaining integument unremarkable.  Vasculature:  Dorsalis Pedis pulse 0/4 bilateral. Posterior Tibial pulse  0/4 bilateral. Capillary fill time <5 sec 1-5 bilateral. No hair growth to the level of the  digits.Temperature gradient within normal limits. No varicosities present bilateral. Chronic edema present bilateral.   Neurology: The patient has diminished sensation measured with a 5.07/10g Semmes Weinstein Monofilament at all pedal sites bilateral . Vibratory sensation diminished bilateral with tuning fork. No Babinski sign present bilateral.   Musculoskeletal: Right 5th hammertoe and Asymptomatic pes planus pedal deformities noted bilateral. Muscular strength 4/5 in all lower extremity muscular groups bilateral without pain on range of motion. No tenderness with calf compression bilateral.  Assessment and Plan: Problem List Items Addressed This Visit    None    Visit Diagnoses    Dermatophytosis of nail    -  Primary   Relevant Medications   clotrimazole  (LOTRIMIN) 1 % external solution   Diabetic polyneuropathy associated with type 2 diabetes mellitus (HCC)       Toe pain, bilateral       Lymphedema       Pes planus of both feet         -Examined patient. -Discussed and educated patient on diabetic foot care, especially with  regards to the vascular, neurological and musculoskeletal systems.  -Stressed the importance of good glycemic control and the detriment of not  controlling glucose levels in relation to the foot. -Mechanically debrided all nails 1-5 bilateral using sterile nail nipper and filed with dremel without incident  -Dry callus to heels; recommend continue with skin emollients as previous [place] healthy feet cream for dry skin -Recommend toe cap as given or Corn pad as needed for right 5th toe -Recommend good supportive shoes  -Answered all patient questions -Patient to return  in 3 months for at risk foot care -Patient advised to call the office if any problems or questions arise in the meantime.  Landis Martins, DPM

## 2016-11-10 NOTE — Patient Instructions (Signed)
[place] healthy feet cream for dry skin Corn pad as needed for right 5th toe

## 2016-11-12 ENCOUNTER — Ambulatory Visit: Payer: Medicare Other

## 2016-11-16 ENCOUNTER — Other Ambulatory Visit: Payer: Self-pay | Admitting: Internal Medicine

## 2016-11-16 DIAGNOSIS — N183 Chronic kidney disease, stage 3 unspecified: Secondary | ICD-10-CM

## 2016-11-28 ENCOUNTER — Other Ambulatory Visit: Payer: Self-pay | Admitting: Internal Medicine

## 2016-11-30 NOTE — Telephone Encounter (Signed)
[place] Controlled Substance Database checked. Last filled on 10/28/16

## 2016-11-30 NOTE — Telephone Encounter (Signed)
RX faxed to [facility] 

## 2016-12-08 ENCOUNTER — Telehealth: Payer: Self-pay | Admitting: Internal Medicine

## 2016-12-08 NOTE — Telephone Encounter (Signed)
Pt called in and said that she is trying to order some compression stockings and they compy is needing to to know what level she is at?  Or what level of compression Dr burns thinks she should have

## 2016-12-08 NOTE — Telephone Encounter (Signed)
Ideally medium compression - 20-70mm hg

## 2016-12-09 NOTE — Telephone Encounter (Signed)
Spoke with pt to inform.  

## 2016-12-10 NOTE — Progress Notes (Signed)
Subjective:    Patient ID: April [name], female    DOB: 1951/01/19, 66 y.o.   MRN: [id number]  HPI She is here for a physical exam.   Wound on right buttock: she still has the wound on her right buttock.  It is very tender.  She uses the cushion. She has the aid that bathes her put desitin on it.  Her aids feel it is stable.  She just wants it to heal.    Bilateral leg edema - it has been increased over the past 2 weeks.   She denies increased salt intake.  She denies changes in her medications or activity.  It may be related to her having more appointments and having to take her lasix later in the day and being up later.  When she takes her lasix later she stays up and is not in bed so her feet are down.   Leg cramping:  She has intermittent lower leg cramping.  It can be very painful.  She tries to drink plenty of water, but is also on high dose lasix.    Dark patches on her elbows:  It has been there a while.  She denies itching and dry patches.  She uses lotion on it regularly.  She does lean on her elbows frequently.  She denies similar dark patches elsewhere.   She had a headaches since 4 am when she woke up.  It is still there.  She has not taken anything.   She denies migraines.  She had a headaches like this in the past when she had elevated BP.    Medications and allergies reviewed with patient and updated if appropriate.  Patient Active Problem List   Diagnosis Date Noted  . Wheeze 09/11/2016  . Cough 09/11/2016  . Left shoulder pain 08/14/2016  . Injection site reaction 08/14/2016  . Meralgia paresthetica 07/16/2016  . Chronic left-sided low back pain with left-sided sciatica 06/02/2016  . Venous stasis ulcer ([place]) 05/13/2016  . Cellulitis and abscess of foot 03/28/2016  . Whole body pain 03/20/2016  . Peripheral polyneuropathy 03/20/2016  . Diabetic peripheral neuropathy ([place]) 03/20/2016  . Osteopenia 01/11/2016  . CKD (chronic kidney disease) stage 3, GFR 30-59  ml/min (HCC) 01/02/2016  . Infected blister of right leg 01/02/2016  . Hand paresthesia 07/18/2015  . Carpal tunnel syndrome 06/07/2015  . Family history of colon cancer   . Diabetes mellitus with neurological manifestations ([place]) 04/18/2015  . Bilateral leg edema 04/11/2015  . Cellulitis of leg, right 04/11/2015  . Abnormality of gait 01/03/2015  . Hereditary and idiopathic peripheral neuropathy 01/03/2015  . GERD (gastroesophageal reflux disease) 06/17/2014  . Morbid obesity with BMI of 50.0-59.9, adult ([place]) 09/05/2013  . Hx of colonic polyps 12/15/2012  . Vitamin B12 deficiency 11/03/2012  . Intrinsic asthma 03/23/2012  . Chronic diastolic heart failure ([place]) 02/20/2011  . OSA (obstructive sleep apnea) 09/17/2010  . URINARY URGENCY 01/08/2010  . CAD, NATIVE VESSEL 11/20/2008  . OSTEOARTHRITIS, KNEES, BILATERAL, SEVERE 06/14/2008  . Hyperlipidemia 05/10/2007  . Essential hypertension 01/18/2007  . HYPOKALEMIA 04/30/2006  . HX, PERSONAL, PEPTIC ULCER DISEASE 04/30/2006    Current Outpatient Medications on File Prior to Visit  Medication Sig Dispense Refill  . albuterol (PROVENTIL HFA) 108 (90 Base) MCG/ACT inhaler Inhale 2 puffs into the lungs every 4 (four) hours as needed for wheezing or shortness of breath. 1 Inhaler 4  . Amino Acids-Protein Hydrolys (FEEDING SUPPLEMENT, PRO-STAT SUGAR FREE 64,) LIQD  Take 30 mLs by mouth 3 (three) times daily with meals. 2700 mL 3  . aspirin (ECOTRIN LOW STRENGTH) 81 MG EC tablet Take 81 mg by mouth at bedtime.     Marland Kitchen atorvastatin (LIPITOR) 20 MG tablet Take 1 tablet (20 mg total) by mouth daily. 30 tablet 0  . budesonide-formoterol (SYMBICORT) 160-4.5 MCG/ACT inhaler one - two inhalations every 12 hours; gargle and spit after use (Patient taking differently: Inhale 2 puffs into the lungs 2 (two) times daily as needed (shortness of breath). gargle and spit after use) 1 Inhaler 4  . carvedilol (COREG) 25 MG tablet take 1 tablet by mouth twice a  day with meals 60 tablet 5  . clotrimazole (LOTRIMIN) 1 % external solution Apply 1 application topically 2 (two) times daily. In between toes 60 mL 5  . Cod Liver Oil CAPS Take 1 capsule by mouth daily.     . cyanocobalamin (,VITAMIN B-12,) 1000 MCG/ML injection INJECT 1 ML INTO MUSCLE EVERY 30 DAYS 2 mL 2  . diclofenac sodium (VOLTAREN) 1 % GEL Apply 2 g topically 4 (four) times daily. 100 g 11  . docusate sodium (COLACE) 100 MG capsule Take 2-3 capsules (200-300 mg total) by mouth daily. 90 capsule 11  . DULoxetine (CYMBALTA) 60 MG capsule Take 1 capsule (60 mg total) by mouth daily. 30 capsule 12  . Flaxseed, Linseed, (FLAX SEED OIL) 1000 MG CAPS Take 1,000 mg by mouth 2 (two) times daily.     . fluticasone (FLONASE) 50 MCG/ACT nasal spray Place 2 sprays into both nostrils daily. 16 g 0  . folic acid (FOLVITE) 962 MCG tablet Take 800 mcg by mouth at bedtime.     . furosemide (LASIX) 40 MG tablet Take 3 tablets (120 mg total) by mouth 2 (two) times daily. 360 tablet 5  . Lancets (ONETOUCH ULTRASOFT) lancets Use to help check blood sugars twice a day Dx E11.9 100 each 3  . lidocaine (GLYDO) 2 % jelly Apply 2 grams to affected area, if needed. 30 mL 11  . LYRICA 50 MG capsule take 1 capsule by mouth twice a day 60 capsule 3  . Multiple Vitamin (MULTIVITAMIN) tablet Take 1 tablet by mouth daily.      Marland Kitchen NEEDLE, DISP, 23 G (EASY TOUCH FLIPLOCK NEEDLES) 23G X 1-1/2" MISC Use to inject B12 once monthly. 2 each 0  . nystatin cream (MYCOSTATIN) Apply 1 application topically 2 (two) times daily. 30 g 2  . Omega-3 Fatty Acids (FISH OIL) 1000 MG CAPS Take 1,000 mg by mouth daily.     Marland Kitchen omeprazole (PRILOSEC) 40 MG capsule Take 1 capsule (40 mg total) by mouth 2 (two) times daily. 60 capsule 6  . potassium chloride SA (K-DUR,KLOR-CON) 20 MEQ tablet take 3 tablets by mouth once daily 270 tablet 1  . RA COL-RITE 100 MG capsule take 1 capsule by mouth twice a day if needed for MILD CONSTIPATION 60 capsule 0    . simvastatin (ZOCOR) 40 MG tablet take 1 tablet by mouth at bedtime 90 tablet 3  . spironolactone (ALDACTONE) 25 MG tablet Take 1 tablet (25 mg total) by mouth daily. 30 tablet 5  . SYRINGE-NEEDLE, DISP, 3 ML 23G X 1" 3 ML MISC Use with B12 to inject IM every 30 days 50 each 0  . topiramate (TOPAMAX) 100 MG tablet Take 1 tablet (100 mg total) by mouth 2 (two) times daily. 60 tablet 11   No current facility-administered medications on file prior to  visit.     Past Medical History:  Diagnosis Date  . Anemia   . Asthma   . CAD (coronary artery disease)   . Carpal tunnel syndrome   . Cellulitis of both lower extremities 04/11/2015  . CHF (congestive heart failure) ([place])   . Colon polyp, hyperplastic 2007 & 2012  . Complication of anesthesia 1999   svt with renal calculi surgery, no problems since  . CTS (carpal tunnel syndrome)   . Diabetes mellitus   . Eczema   . Hyperlipidemia   . Hypertension   . Leg ulcer ([place]) 04/24/2015   Right lateral leg No evidence of an infection Monitor closely Keep edema controlled   . Meralgia paresthetica    Dr. [name]  . Morbid obesity ([place])   . Morbid obesity ([place])   . Neuropathy    toes and legs  . Osteoarthrosis, unspecified whether generalized or localized, lower leg   . PUD (peptic ulcer disease)   . Shortness of breath dyspnea    with exertion  . Type II or unspecified type diabetes mellitus without mention of complication, not stated as uncontrolled   . Unspecified hereditary and idiopathic peripheral neuropathy   . Vitamin B12 deficiency   . Wound cellulitis    right upper leg, healing well    Past Surgical History:  Procedure Laterality Date  . ABDOMINAL HYSTERECTOMY    . CARDIAC CATHETERIZATION  2002   non obstructive disease  . colonoscopy with polypectomy  2007 & 2012    hyperplastic ;Dr Watt Climes  . DILATION AND CURETTAGE OF UTERUS     multiple  . HEMORRHOID SURGERY    . renal calculi  12/1997   SVT with induction of anesthesia   . right knee arthroscopy    . TONSILLECTOMY AND ADENOIDECTOMY      Social History   Socioeconomic History  . Marital status: Married    Spouse name: [name]  . Number of children: 1  . Years of education: BS  . Highest education level: None  Social Needs  . Financial resource strain: None  . Food insecurity - worry: None  . Food insecurity - inability: None  . Transportation needs - medical: None  . Transportation needs - non-medical: None  Occupational History  . Occupation: Disabled    Employer: RETIRED  Tobacco Use  . Smoking status: Never Smoker  . Smokeless tobacco: Never Used  Substance and Sexual Activity  . Alcohol use: No    Alcohol/week: 0.0 oz  . Drug use: No  . Sexual activity: None  Other Topics Concern  . None  Social History Narrative   Patient lives at home with her husband [name] Patient is retired and has a Conservation officer, nature.    Caffeine - some times.   Right handed.    Family History  Problem Relation Age of Onset  . Colon cancer Mother   . Prostate cancer Father   . Colon cancer Father   . Diabetes Maternal Aunt   . Breast cancer Maternal Aunt   . Diabetes Maternal Uncle   . Diabetes Paternal Aunt   . Stroke Paternal Aunt        > 65  . Heart disease Paternal Aunt   . Diabetes Paternal Uncle   . Breast cancer Maternal Aunt         X 2  . Breast cancer Cousin     Review of Systems  Constitutional: Negative for chills and fever.  Eyes: Negative for  visual disturbance.  Respiratory: Positive for shortness of breath and wheezing. Negative for cough.   Cardiovascular: Positive for palpitations (occ) and leg swelling. Negative for chest pain.  Gastrointestinal: Negative for abdominal pain, blood in stool, constipation, diarrhea and nausea.       No gerd  Genitourinary: Negative for dysuria and hematuria.  Musculoskeletal: Positive for arthralgias and back pain.  Skin: Positive for color change (elbows - darker).  Neurological:  Positive for light-headedness (when she gets too quick) and headaches. Negative for dizziness.  Psychiatric/Behavioral: Negative for dysphoric mood. The patient is not nervous/anxious.        Objective:   Vitals:   12/11/16 1350  BP: 134/78  Pulse: 68  Temp: 98.8 F (37.1 C)  SpO2: 98%   Filed Weights   12/11/16 1350  Weight: (!) 355 lb (161 kg)   Body mass index is 59.08 kg/m.  Wt Readings from Last 3 Encounters:  12/11/16 (!) 355 lb (161 kg)  08/14/16 (!) 351 lb (159.2 kg)  07/16/16 (!) 360 lb (163.3 kg)     Physical Exam Constitutional: She appears well-developed and well-nourished. No distress.  HENT:  Head: Normocephalic and atraumatic.  Right Ear: External ear normal. Normal ear canal and TM Left Ear: External ear normal.  Normal ear canal and TM Mouth/Throat: Oropharynx is clear and moist.  Eyes: Conjunctivae and EOM are normal.  Neck: Neck supple. No tracheal deviation present. No thyromegaly present.  No carotid bruit  Cardiovascular: Normal rate, regular rhythm and normal heart sounds.   No murmur heard.  B/l LE lymphedema - legs wrapped - did not unwrap them, swelling in upper legs posterior aspect of legs. Pulmonary/Chest: Effort normal and breath sounds normal. No respiratory distress. She has no wheezes. She has no rales.  Breast: deferred   Abdominal: Soft. Obese. She exhibits no distension. There is no tenderness.  Lymphadenopathy: She has no cervical adenopathy.  Skin: Skin is warm and dry. She is not diaphoretic. hyperpigmented patches on elbows Psychiatric: She has a normal mood and affect. Her behavior is normal.        Assessment & Plan:   Physical exam: Screening blood work ordered Immunizations  Discussed shingrix, others up to date Colonoscopy   Up to date  Mammogram  Up to date  Gyn - no longer following with gyn Dexa   Up to date  Eye exams   Up to date  EKG  Last done 03/2016 Exercise - minimal activity Weight - stressed weight  loss Skin dark spots on elbows - ? Related to pressure on elbows,no other concenrs Substance abuse  none  See Problem List for Assessment and Plan of chronic medical problems.   F/u in 6 months

## 2016-12-10 NOTE — Patient Instructions (Addendum)
Test(s) ordered today. Your results will be released to MyChart (or called to you) after review, usually within 72hours after test completion. If any changes need to be made, you will be notified at that same time.  All other Health Maintenance issues reviewed.   All recommended immunizations and age-appropriate screenings are up-to-date or discussed.  No immunizations administered today.   Medications reviewed and updated.  No changes recommended at this time.   Please followup in 6 months   Health Maintenance, Female Adopting a healthy lifestyle and getting preventive care can go a long way to promote health and wellness. Talk with your health care provider about what schedule of regular examinations is right for you. This is a good chance for you to check in with your provider about disease prevention and staying healthy. In between checkups, there are plenty of things you can do on your own. Experts have done a lot of research about which lifestyle changes and preventive measures are most likely to keep you healthy. Ask your health care provider for more information. Weight and diet Eat a healthy diet  Be sure to include plenty of vegetables, fruits, low-fat dairy products, and lean protein.  Do not eat a lot of foods high in solid fats, added sugars, or salt.  Get regular exercise. This is one of the most important things you can do for your health. ? Most adults should exercise for at least 150 minutes each week. The exercise should increase your heart rate and make you sweat (moderate-intensity exercise). ? Most adults should also do strengthening exercises at least twice a week. This is in addition to the moderate-intensity exercise.  Maintain a healthy weight  Body mass index (BMI) is a measurement that can be used to identify possible weight problems. It estimates body fat based on height and weight. Your health care provider can help determine your BMI and help you achieve or  maintain a healthy weight.  For females 20 years of age and older: ? A BMI below 18.5 is considered underweight. ? A BMI of 18.5 to 24.9 is normal. ? A BMI of 25 to 29.9 is considered overweight. ? A BMI of 30 and above is considered obese.  Watch levels of cholesterol and blood lipids  You should start having your blood tested for lipids and cholesterol at 66 years of age, then have this test every 5 years.  You may need to have your cholesterol levels checked more often if: ? Your lipid or cholesterol levels are high. ? You are older than 66 years of age. ? You are at high risk for heart disease.  Cancer screening Lung Cancer  Lung cancer screening is recommended for adults 55-80 years old who are at high risk for lung cancer because of a history of smoking.  A yearly low-dose CT scan of the lungs is recommended for people who: ? Currently smoke. ? Have quit within the past 15 years. ? Have at least a 30-pack-year history of smoking. A pack year is smoking an average of one pack of cigarettes a day for 1 year.  Yearly screening should continue until it has been 15 years since you quit.  Yearly screening should stop if you develop a health problem that would prevent you from having lung cancer treatment.  Breast Cancer  Practice breast self-awareness. This means understanding how your breasts normally appear and feel.  It also means doing regular breast self-exams. Let your health care provider know about any changes,   no matter how small.  If you are in your 20s or 30s, you should have a clinical breast exam (CBE) by a health care provider every 1-3 years as part of a regular health exam.  If you are 40 or older, have a CBE every year. Also consider having a breast X-ray (mammogram) every year.  If you have a family history of breast cancer, talk to your health care provider about genetic screening.  If you are at high risk for breast cancer, talk to your health care  provider about having an MRI and a mammogram every year.  Breast cancer gene (BRCA) assessment is recommended for women who have family members with BRCA-related cancers. BRCA-related cancers include: ? Breast. ? Ovarian. ? Tubal. ? Peritoneal cancers.  Results of the assessment will determine the need for genetic counseling and BRCA1 and BRCA2 testing.  Cervical Cancer Your health care provider may recommend that you be screened regularly for cancer of the pelvic organs (ovaries, uterus, and vagina). This screening involves a pelvic examination, including checking for microscopic changes to the surface of your cervix (Pap test). You may be encouraged to have this screening done every 3 years, beginning at age 21.  For women ages 30-65, health care providers may recommend pelvic exams and Pap testing every 3 years, or they may recommend the Pap and pelvic exam, combined with testing for human papilloma virus (HPV), every 5 years. Some types of HPV increase your risk of cervical cancer. Testing for HPV may also be done on women of any age with unclear Pap test results.  Other health care providers may not recommend any screening for nonpregnant women who are considered low risk for pelvic cancer and who do not have symptoms. Ask your health care provider if a screening pelvic exam is right for you.  If you have had past treatment for cervical cancer or a condition that could lead to cancer, you need Pap tests and screening for cancer for at least 20 years after your treatment. If Pap tests have been discontinued, your risk factors (such as having a new sexual partner) need to be reassessed to determine if screening should resume. Some women have medical problems that increase the chance of getting cervical cancer. In these cases, your health care provider may recommend more frequent screening and Pap tests.  Colorectal Cancer  This type of cancer can be detected and often prevented.  Routine  colorectal cancer screening usually begins at 66 years of age and continues through 66 years of age.  Your health care provider may recommend screening at an earlier age if you have risk factors for colon cancer.  Your health care provider may also recommend using home test kits to check for hidden blood in the stool.  A small camera at the end of a tube can be used to examine your colon directly (sigmoidoscopy or colonoscopy). This is done to check for the earliest forms of colorectal cancer.  Routine screening usually begins at age 50.  Direct examination of the colon should be repeated every 5-10 years through 66 years of age. However, you may need to be screened more often if early forms of precancerous polyps or small growths are found.  Skin Cancer  Check your skin from head to toe regularly.  Tell your health care provider about any new moles or changes in moles, especially if there is a change in a mole's shape or color.  Also tell your health care provider if you   have a mole that is larger than the size of a pencil eraser.  Always use sunscreen. Apply sunscreen liberally and repeatedly throughout the day.  Protect yourself by wearing long sleeves, pants, a wide-brimmed hat, and sunglasses whenever you are outside.  Heart disease, diabetes, and high blood pressure  High blood pressure causes heart disease and increases the risk of stroke. High blood pressure is more likely to develop in: ? People who have blood pressure in the high end of the normal range (130-139/85-89 mm Hg). ? People who are overweight or obese. ? People who are African American.  If you are 18-39 years of age, have your blood pressure checked every 3-5 years. If you are 40 years of age or older, have your blood pressure checked every year. You should have your blood pressure measured twice-once when you are at a hospital or clinic, and once when you are not at a hospital or clinic. Record the average of the  two measurements. To check your blood pressure when you are not at a hospital or clinic, you can use: ? An automated blood pressure machine at a pharmacy. ? A home blood pressure monitor.  If you are between 55 years and 79 years old, ask your health care provider if you should take aspirin to prevent strokes.  Have regular diabetes screenings. This involves taking a blood sample to check your fasting blood sugar level. ? If you are at a normal weight and have a low risk for diabetes, have this test once every three years after 66 years of age. ? If you are overweight and have a high risk for diabetes, consider being tested at a younger age or more often. Preventing infection Hepatitis B  If you have a higher risk for hepatitis B, you should be screened for this virus. You are considered at high risk for hepatitis B if: ? You were born in a country where hepatitis B is common. Ask your health care provider which countries are considered high risk. ? Your parents were born in a high-risk country, and you have not been immunized against hepatitis B (hepatitis B vaccine). ? You have HIV or AIDS. ? You use needles to inject street drugs. ? You live with someone who has hepatitis B. ? You have had sex with someone who has hepatitis B. ? You get hemodialysis treatment. ? You take certain medicines for conditions, including cancer, organ transplantation, and autoimmune conditions.  Hepatitis C  Blood testing is recommended for: ? Everyone born from 1945 through 1965. ? Anyone with known risk factors for hepatitis C.  Sexually transmitted infections (STIs)  You should be screened for sexually transmitted infections (STIs) including gonorrhea and chlamydia if: ? You are sexually active and are younger than 66 years of age. ? You are older than 66 years of age and your health care provider tells you that you are at risk for this type of infection. ? Your sexual activity has changed since you  were last screened and you are at an increased risk for chlamydia or gonorrhea. Ask your health care provider if you are at risk.  If you do not have HIV, but are at risk, it may be recommended that you take a prescription medicine daily to prevent HIV infection. This is called pre-exposure prophylaxis (PrEP). You are considered at risk if: ? You are sexually active and do not regularly use condoms or know the HIV status of your partner(s). ? You take drugs by injection. ?   You are sexually active with a partner who has HIV.  Talk with your health care provider about whether you are at high risk of being infected with HIV. If you choose to begin PrEP, you should first be tested for HIV. You should then be tested every 3 months for as long as you are taking PrEP. Pregnancy  If you are premenopausal and you may become pregnant, ask your health care provider about preconception counseling.  If you may become pregnant, take 400 to 800 micrograms (mcg) of folic acid every day.  If you want to prevent pregnancy, talk to your health care provider about birth control (contraception). Osteoporosis and menopause  Osteoporosis is a disease in which the bones lose minerals and strength with aging. This can result in serious bone fractures. Your risk for osteoporosis can be identified using a bone density scan.  If you are 65 years of age or older, or if you are at risk for osteoporosis and fractures, ask your health care provider if you should be screened.  Ask your health care provider whether you should take a calcium or vitamin D supplement to lower your risk for osteoporosis.  Menopause may have certain physical symptoms and risks.  Hormone replacement therapy may reduce some of these symptoms and risks. Talk to your health care provider about whether hormone replacement therapy is right for you. Follow these instructions at home:  Schedule regular health, dental, and eye exams.  Stay current  with your immunizations.  Do not use any tobacco products including cigarettes, chewing tobacco, or electronic cigarettes.  If you are pregnant, do not drink alcohol.  If you are breastfeeding, limit how much and how often you drink alcohol.  Limit alcohol intake to no more than 1 drink per day for nonpregnant women. One drink equals 12 ounces of beer, 5 ounces of wine, or 1 ounces of hard liquor.  Do not use street drugs.  Do not share needles.  Ask your health care provider for help if you need support or information about quitting drugs.  Tell your health care provider if you often feel depressed.  Tell your health care provider if you have ever been abused or do not feel safe at home. This information is not intended to replace advice given to you by your health care provider. Make sure you discuss any questions you have with your health care provider. Document Released: 08/04/2010 Document Revised: 06/27/2015 Document Reviewed: 10/23/2014 Elsevier Interactive Patient Education  2018 [employer] Inc.  

## 2016-12-11 ENCOUNTER — Encounter: Payer: Self-pay | Admitting: Internal Medicine

## 2016-12-11 ENCOUNTER — Ambulatory Visit (INDEPENDENT_AMBULATORY_CARE_PROVIDER_SITE_OTHER): Payer: Medicare Other | Admitting: Internal Medicine

## 2016-12-11 ENCOUNTER — Other Ambulatory Visit (INDEPENDENT_AMBULATORY_CARE_PROVIDER_SITE_OTHER): Payer: Medicare Other

## 2016-12-11 VITALS — BP 134/78 | HR 68 | Temp 98.8°F | Ht 65.0 in | Wt 355.0 lb

## 2016-12-11 DIAGNOSIS — R252 Cramp and spasm: Secondary | ICD-10-CM

## 2016-12-11 DIAGNOSIS — R600 Localized edema: Secondary | ICD-10-CM | POA: Diagnosis not present

## 2016-12-11 DIAGNOSIS — M8588 Other specified disorders of bone density and structure, other site: Secondary | ICD-10-CM

## 2016-12-11 DIAGNOSIS — N183 Chronic kidney disease, stage 3 unspecified: Secondary | ICD-10-CM

## 2016-12-11 DIAGNOSIS — I10 Essential (primary) hypertension: Secondary | ICD-10-CM

## 2016-12-11 DIAGNOSIS — E1140 Type 2 diabetes mellitus with diabetic neuropathy, unspecified: Secondary | ICD-10-CM

## 2016-12-11 DIAGNOSIS — I5032 Chronic diastolic (congestive) heart failure: Secondary | ICD-10-CM | POA: Diagnosis not present

## 2016-12-11 DIAGNOSIS — K219 Gastro-esophageal reflux disease without esophagitis: Secondary | ICD-10-CM

## 2016-12-11 DIAGNOSIS — Z0000 Encounter for general adult medical examination without abnormal findings: Secondary | ICD-10-CM

## 2016-12-11 LAB — HEMOGLOBIN A1C: HEMOGLOBIN A1C: 5.7 % (ref 4.6–6.5)

## 2016-12-11 LAB — COMPREHENSIVE METABOLIC PANEL
ALK PHOS: 75 U/L (ref 39–117)
ALT: 10 U/L (ref 0–35)
AST: 13 U/L (ref 0–37)
Albumin: 4 g/dL (ref 3.5–5.2)
BUN: 30 mg/dL — AB (ref 6–23)
CO2: 31 meq/L (ref 19–32)
Calcium: 9.9 mg/dL (ref 8.4–10.5)
Chloride: 103 mEq/L (ref 96–112)
Creatinine, Ser: 1.33 mg/dL — ABNORMAL HIGH (ref 0.40–1.20)
GFR: 51.25 mL/min — ABNORMAL LOW (ref >60.00)
GLUCOSE: 108 mg/dL — AB (ref 70–99)
POTASSIUM: 3.5 meq/L (ref 3.5–5.1)
SODIUM: 142 meq/L (ref 135–145)
TOTAL PROTEIN: 7.5 g/dL (ref 6.0–8.3)
Total Bilirubin: 0.5 mg/dL (ref 0.2–1.2)

## 2016-12-11 LAB — CBC WITH DIFFERENTIAL/PLATELET
BASOS PCT: 0.2 % (ref 0.0–3.0)
Basophils Absolute: 0 10*3/uL (ref 0.0–0.1)
EOS PCT: 0.1 % (ref 0.0–5.0)
Eosinophils Absolute: 0 10*3/uL (ref 0.0–0.7)
HCT: 40.6 % (ref 36.0–46.0)
Hemoglobin: 12.9 g/dL (ref 12.0–15.0)
LYMPHS ABS: 1.3 10*3/uL (ref 0.7–4.0)
Lymphocytes Relative: 15.3 % (ref 12.0–46.0)
MCHC: 31.8 g/dL (ref 30.0–36.0)
MCV: 90.6 fl (ref 78.0–100.0)
MONO ABS: 0.5 10*3/uL (ref 0.1–1.0)
Monocytes Relative: 6.3 % (ref 3.0–12.0)
NEUTROS ABS: 6.7 10*3/uL (ref 1.4–7.7)
NEUTROS PCT: 78.1 % — AB (ref 43.0–77.0)
PLATELETS: 161 10*3/uL (ref 150.0–400.0)
RBC: 4.49 Mil/uL (ref 3.87–5.11)
RDW: 15 % (ref 11.5–15.5)
WBC: 8.6 10*3/uL (ref 4.0–10.5)

## 2016-12-11 LAB — LIPID PANEL
CHOLESTEROL: 183 mg/dL (ref 0–200)
HDL: 40.7 mg/dL (ref >39.00)
LDL CALC: 128 mg/dL — AB (ref 0–99)
NONHDL: 142.69
Total CHOL/HDL Ratio: 5
Triglycerides: 72 mg/dL (ref 0.0–149.0)
VLDL: 14.4 mg/dL (ref 0.0–40.0)

## 2016-12-11 LAB — MAGNESIUM: Magnesium: 2.5 mg/dL (ref 1.5–2.5)

## 2016-12-11 LAB — TSH: TSH: 1 u[IU]/mL (ref 0.35–4.50)

## 2016-12-11 NOTE — Progress Notes (Deleted)
Subjective:   April [name] is a 66 y.o. female who presents for Medicare Annual (Subsequent) preventive examination.  Review of Systems:  No ROS.  Medicare Wellness Visit. Additional risk factors are reflected in the social history.    Sleep patterns: {SX; SLEEP PATTERNS:18802::"feels rested on waking","does not get up to void","gets up *** times nightly to void","sleeps *** hours nightly"}.   Home Safety/Smoke Alarms: Feels safe in home. Smoke alarms in place.   Living environment; residence and Firearm Safety: {Rehab home environment / accessibility:30080::"no firearms","firearms stored safely"}. Seat Belt Safety/Bike Helmet: Wears seat belt.     Objective:     Vitals: There were no vitals taken for this visit.  There is no height or weight on file to calculate BMI.   Tobacco Social History   Tobacco Use  Smoking Status Never Smoker  Smokeless Tobacco Never Used     Counseling given: Not Answered   Past Medical History:  Diagnosis Date  . Anemia   . Asthma   . CAD (coronary artery disease)   . Carpal tunnel syndrome   . Cellulitis of both lower extremities 04/11/2015  . CHF (congestive heart failure) ([place])   . Colon polyp, hyperplastic 2007 & 2012  . Complication of anesthesia 1999   svt with renal calculi surgery, no problems since  . CTS (carpal tunnel syndrome)   . Diabetes mellitus   . Eczema   . Hyperlipidemia   . Hypertension   . Leg ulcer ([place]) 04/24/2015   Right lateral leg No evidence of an infection Monitor closely Keep edema controlled   . Meralgia paresthetica    Dr. [name]  . Morbid obesity ([place])   . Morbid obesity ([place])   . Neuropathy    toes and legs  . Osteoarthrosis, unspecified whether generalized or localized, lower leg   . PUD (peptic ulcer disease)   . Shortness of breath dyspnea    with exertion  . Type II or unspecified type diabetes mellitus without mention of complication, not stated as uncontrolled   . Unspecified hereditary and  idiopathic peripheral neuropathy   . Vitamin B12 deficiency   . Wound cellulitis    right upper leg, healing well   Past Surgical History:  Procedure Laterality Date  . ABDOMINAL HYSTERECTOMY    . CARDIAC CATHETERIZATION  2002   non obstructive disease  . colonoscopy with polypectomy  2007 & 2012    hyperplastic ;Dr Watt Climes  . DILATION AND CURETTAGE OF UTERUS     multiple  . HEMORRHOID SURGERY    . renal calculi  12/1997   SVT with induction of anesthesia  . right knee arthroscopy    . TONSILLECTOMY AND ADENOIDECTOMY     Family History  Problem Relation Age of Onset  . Colon cancer Mother   . Prostate cancer Father   . Colon cancer Father   . Diabetes Maternal Aunt   . Breast cancer Maternal Aunt   . Diabetes Maternal Uncle   . Diabetes Paternal Aunt   . Stroke Paternal Aunt        > 65  . Heart disease Paternal Aunt   . Diabetes Paternal Uncle   . Breast cancer Maternal Aunt         X 2  . Breast cancer Cousin    Social History   Substance and Sexual Activity  Sexual Activity Not on file    Outpatient Encounter Medications as of 12/11/2016  Medication Sig  . albuterol (PROVENTIL HFA) 108 (  90 Base) MCG/ACT inhaler Inhale 2 puffs into the lungs every 4 (four) hours as needed for wheezing or shortness of breath.  . Amino Acids-Protein Hydrolys (FEEDING SUPPLEMENT, PRO-STAT SUGAR FREE 64,) LIQD Take 30 mLs by mouth 3 (three) times daily with meals.  Marland Kitchen aspirin (ECOTRIN LOW STRENGTH) 81 MG EC tablet Take 81 mg by mouth at bedtime.   Marland Kitchen atorvastatin (LIPITOR) 20 MG tablet Take 1 tablet (20 mg total) by mouth daily.  . budesonide-formoterol (SYMBICORT) 160-4.5 MCG/ACT inhaler one - two inhalations every 12 hours; gargle and spit after use (Patient taking differently: Inhale 2 puffs into the lungs 2 (two) times daily as needed (shortness of breath). gargle and spit after use)  . carvedilol (COREG) 25 MG tablet take 1 tablet by mouth twice a day with meals  . clotrimazole  (LOTRIMIN) 1 % external solution Apply 1 application topically 2 (two) times daily. In between toes  . Cod Liver Oil CAPS Take 1 capsule by mouth daily.   . cyanocobalamin (,VITAMIN B-12,) 1000 MCG/ML injection INJECT 1 ML INTO MUSCLE EVERY 30 DAYS  . diazepam (VALIUM) 5 MG tablet As needed for MRI  . diclofenac sodium (VOLTAREN) 1 % GEL Apply 2 g topically 4 (four) times daily.  Marland Kitchen docusate sodium (COLACE) 100 MG capsule Take 2-3 capsules (200-300 mg total) by mouth daily.  . DULoxetine (CYMBALTA) 60 MG capsule Take 1 capsule (60 mg total) by mouth daily.  . Flaxseed, Linseed, (FLAX SEED OIL) 1000 MG CAPS Take 1,000 mg by mouth 2 (two) times daily.   . fluticasone (FLONASE) 50 MCG/ACT nasal spray Place 2 sprays into both nostrils daily.  . folic acid (FOLVITE) 174 MCG tablet Take 800 mcg by mouth at bedtime.   . furosemide (LASIX) 40 MG tablet Take 3 tablets (120 mg total) by mouth 2 (two) times daily.  . Lancets (ONETOUCH ULTRASOFT) lancets Use to help check blood sugars twice a day Dx E11.9  . lidocaine (GLYDO) 2 % jelly Apply 2 grams to affected area, if needed.  Marland Kitchen LYRICA 50 MG capsule take 1 capsule by mouth twice a day  . Multiple Vitamin (MULTIVITAMIN) tablet Take 1 tablet by mouth daily.    Marland Kitchen NEEDLE, DISP, 23 G (EASY TOUCH FLIPLOCK NEEDLES) 23G X 1-1/2" MISC Use to inject B12 once monthly.  . nystatin cream (MYCOSTATIN) Apply 1 application topically 2 (two) times daily.  . Omega-3 Fatty Acids (FISH OIL) 1000 MG CAPS Take 1,000 mg by mouth daily.   Marland Kitchen omeprazole (PRILOSEC) 40 MG capsule Take 1 capsule (40 mg total) by mouth 2 (two) times daily.  . potassium chloride SA (K-DUR,KLOR-CON) 20 MEQ tablet take 3 tablets by mouth once daily  . RA COL-RITE 100 MG capsule take 1 capsule by mouth twice a day if needed for MILD CONSTIPATION  . simvastatin (ZOCOR) 40 MG tablet take 1 tablet by mouth at bedtime  . spironolactone (ALDACTONE) 25 MG tablet Take 1 tablet (25 mg total) by mouth daily.  .  SYRINGE-NEEDLE, DISP, 3 ML 23G X 1" 3 ML MISC Use with B12 to inject IM every 30 days  . topiramate (TOPAMAX) 100 MG tablet Take 1 tablet (100 mg total) by mouth 2 (two) times daily.  . [DISCONTINUED] chlorpheniramine-HYDROcodone (TUSSIONEX PENNKINETIC ER) 10-8 MG/5ML SUER Take 5 mLs by mouth every 12 (twelve) hours as needed for cough.   No facility-administered encounter medications on file as of 12/11/2016.     Activities of Daily Living In your present state of  health, do you have any difficulty performing the following activities: 03/28/2016  Hearing? N  Vision? N  Difficulty concentrating or making decisions? N  Walking or climbing stairs? Y  Dressing or bathing? Y  Doing errands, shopping? Y  Some recent data might be hidden    Patient Care Team: [name], MD as PCP - General (Internal Medicine)    Assessment:    Physical assessment deferred to PCP.  Exercise Activities and Dietary recommendations   Diet (meal preparation, eat out, water intake, caffeinated beverages, dairy products, fruits and vegetables): {Desc; diets:16563}       Goals    None     Fall Risk Fall Risk  10/10/2015 12/15/2012  Falls in the past year? Yes Yes  Number falls in past yr: 1 1  Injury with Fall? - Yes  Risk for fall due to : Impaired mobility -   Depression Screen PHQ 2/9 Scores 10/10/2015 12/15/2012  PHQ - 2 Score 0 0     Cognitive Function        Immunization History  Administered Date(s) Administered  . Influenza Split 12/03/2010, 11/03/2011  . Influenza Whole 11/16/2006, 11/22/2007, 10/31/2008, 10/29/2009  . Influenza, High Dose Seasonal PF 10/10/2015, 11/06/2016  . Influenza,inj,Quad PF,6+ Mos 10/19/2012, 10/25/2013, 10/29/2014  . Influenza-Unspecified 10/23/2016  . Pneumococcal Conjugate-13 06/07/2015  . Pneumococcal Polysaccharide-23 03/24/2005, 01/04/2012  . Td 06/24/2009  . Tdap 10/09/2014  . Zoster 01/15/2012  . Zoster Recombinat (Shingrix) 08/11/2016    Screening Tests Health Maintenance  Topic Date Due  . URINE MICROALBUMIN  04/09/2016  . OPHTHALMOLOGY EXAM  05/01/2016  . PNA vac Low Risk Adult (2 of 2 - PPSV23) 01/03/2017  . HEMOGLOBIN A1C  02/14/2017  . FOOT EXAM  11/10/2017  . MAMMOGRAM  03/17/2018  . DEXA SCAN  01/10/2019  . TETANUS/TDAP  10/08/2024  . COLONOSCOPY  06/03/2025  . INFLUENZA VACCINE  Completed  . Hepatitis C Screening  Completed      Plan:     I have personally reviewed and noted the following in the patient's chart:   . Medical and social history . Use of alcohol, tobacco or illicit drugs  . Current medications and supplements . Functional ability and status . Nutritional status . Physical activity . Advanced directives . List of other physicians . Vitals . Screenings to include cognitive, depression, and falls . Referrals and appointments  In addition, I have reviewed and discussed with patient certain preventive protocols, quality metrics, and best practice recommendations. A written personalized care plan for preventive services as well as general preventive health recommendations were provided to patient.     Michiel Cowboy, RN  12/11/2016

## 2016-12-12 NOTE — Assessment & Plan Note (Addendum)
dexa up to date Will repeat 2020

## 2016-12-12 NOTE — Assessment & Plan Note (Signed)
Stable B/l legs wrapped Continue current lasix dose cmp

## 2016-12-12 NOTE — Assessment & Plan Note (Signed)
Chronic lymphedema - stable Lungs are clear euvolemic Lasix per cardio/nephro

## 2016-12-12 NOTE — Assessment & Plan Note (Signed)
GERD controlled Continue daily medication  

## 2016-12-12 NOTE — Assessment & Plan Note (Signed)
Following with nephrology cmp

## 2016-12-12 NOTE — Assessment & Plan Note (Signed)
Lab Results  Component Value Date   HGBA1C 5.7 12/11/2016   Diet controlled Check a1c

## 2016-12-12 NOTE — Assessment & Plan Note (Signed)
BP well controlled Current regimen effective and well tolerated Continue current medications at current doses cmp  

## 2016-12-15 ENCOUNTER — Ambulatory Visit: Payer: Medicare Other

## 2016-12-28 ENCOUNTER — Telehealth: Payer: Self-pay | Admitting: Internal Medicine

## 2016-12-28 NOTE — Telephone Encounter (Signed)
Pt called stating that she is currently taking Lyrica 50 mg twice a day. She wanted to know if she could reduce her dose to only taking one a day instead. Please advise.

## 2016-12-28 NOTE — Telephone Encounter (Signed)
Not aware of specifics of farrow wraps.       What does the order need to say - ok on a prescription

## 2016-12-28 NOTE — Telephone Encounter (Signed)
Pt called stating that her compression stockings are too tight. She said that they were rolling down and cutting off her circulation. The home health aid has ordered a larger size to see if that helps but also mentioned that she check with Dr Quay Burow to see if she has ever heard of Lenore Cordia? If so, she recommended that these be ordered to replace the compression stockings. Please advise.

## 2016-12-28 NOTE — Telephone Encounter (Signed)
She can try it once a day, but will likely not be as effective

## 2016-12-29 NOTE — Telephone Encounter (Signed)
Spoke with pt to inform.  

## 2016-12-29 NOTE — Telephone Encounter (Signed)
Spoke with pt, she is going to get more information and give [place] a call back where the RX needs to go.

## 2016-12-30 ENCOUNTER — Telehealth: Payer: Self-pay | Admitting: Internal Medicine

## 2016-12-30 NOTE — Telephone Encounter (Signed)
Home Health RN called in with patient update. Her weight today is 357 lb yesterday was 358 lb. Lungs clear to auscultate, pedal edema +2.

## 2016-12-30 NOTE — Telephone Encounter (Signed)
Noted.  No change.

## 2016-12-30 NOTE — Telephone Encounter (Signed)
See message below °

## 2017-01-06 DIAGNOSIS — N183 Chronic kidney disease, stage 3 (moderate): Secondary | ICD-10-CM | POA: Diagnosis not present

## 2017-01-06 DIAGNOSIS — E1122 Type 2 diabetes mellitus with diabetic chronic kidney disease: Secondary | ICD-10-CM | POA: Diagnosis not present

## 2017-01-06 DIAGNOSIS — I5032 Chronic diastolic (congestive) heart failure: Secondary | ICD-10-CM | POA: Diagnosis not present

## 2017-01-06 DIAGNOSIS — D519 Vitamin B12 deficiency anemia, unspecified: Secondary | ICD-10-CM | POA: Diagnosis not present

## 2017-01-06 DIAGNOSIS — I872 Venous insufficiency (chronic) (peripheral): Secondary | ICD-10-CM | POA: Diagnosis not present

## 2017-01-06 DIAGNOSIS — Z7982 Long term (current) use of aspirin: Secondary | ICD-10-CM

## 2017-01-06 DIAGNOSIS — D631 Anemia in chronic kidney disease: Secondary | ICD-10-CM | POA: Diagnosis not present

## 2017-01-06 DIAGNOSIS — Z9181 History of falling: Secondary | ICD-10-CM | POA: Diagnosis not present

## 2017-01-06 DIAGNOSIS — E1140 Type 2 diabetes mellitus with diabetic neuropathy, unspecified: Secondary | ICD-10-CM | POA: Diagnosis not present

## 2017-01-06 DIAGNOSIS — Z6841 Body Mass Index (BMI) 40.0 and over, adult: Secondary | ICD-10-CM | POA: Diagnosis not present

## 2017-01-06 DIAGNOSIS — F339 Major depressive disorder, recurrent, unspecified: Secondary | ICD-10-CM | POA: Diagnosis not present

## 2017-01-06 DIAGNOSIS — I130 Hypertensive heart and chronic kidney disease with heart failure and stage 1 through stage 4 chronic kidney disease, or unspecified chronic kidney disease: Secondary | ICD-10-CM | POA: Diagnosis not present

## 2017-01-14 ENCOUNTER — Ambulatory Visit: Payer: Self-pay

## 2017-01-14 ENCOUNTER — Ambulatory Visit: Payer: Medicare Other

## 2017-01-14 NOTE — Telephone Encounter (Signed)
rec'd call from [facility] RN from [facility].  Stated she wanted to ask if she could get an order for bilateral unna boots.  Reported the pt. Has "recurring edema, from feet to knees, and a flat macular lesion on right lateral lower leg."  Reported the lesion is 3.0 x 3.3 cm.  Stated the lesion is tender for the pt., but denied redness or break in skin.  Stated the pt. Reported the area had blistered up; [facility] RN stated the blister is flat now.  Stated  The reason for the call is mainly the lesion on right LL, and that she is trying to prevent this area from worsening, by adding Unna boot therapy.  Denied that pt. has SOB.  Stated weight is stable. Advised will forward note to PCP re: request for new orders for Unna boots.     Reason for Disposition . [1] MODERATE leg swelling (e.g., swelling extends up to knees) AND [2] new onset or worsening  Answer Assessment - Initial Assessment Questions 1. ONSET: "When did the swelling start?" (e.g., minutes, hours, days)     Quite a long time 2. LOCATION: "What part of the leg is swollen?"  "Are both legs swollen or just one leg?"     Bil. Lower extremities from feet to knees; stated upper legs not as edematous  3. SEVERITY: "How bad is the swelling?" (e.g., localized; mild, moderate, severe)  - Localized - small area of swelling localized to one leg  - MILD pedal edema - swelling limited to foot and ankle, pitting edema < 1/4 inch (6 mm) deep, rest and elevation eliminate most or all swelling  - MODERATE edema - swelling of lower leg to knee, pitting edema > 1/4 inch (6 mm) deep, rest and elevation only partially reduce swelling  - SEVERE edema - swelling extends above knee, facial or hand swelling present      Moderate 4. REDNESS: "Does the swelling look red or infected?"     No redness  5. PAIN: "Is the swelling painful to touch?" If so, ask: "How painful is it?"   (Scale 1-10; mild, moderate or severe)    Pain at lesion on right lateral leg; rated pain 5/10;   6. FEVER: "Do you have a fever?" If so, ask: "What is it, how was it measured, and when did it start?"      No  7. CAUSE: "What do you think is causing the leg swelling?"    Chronic swelling 8. MEDICAL HISTORY: "Do you have a history of heart failure, kidney disease, liver failure, or cancer?"     CHF  9. RECURRENT SYMPTOM: "Have you had leg swelling before?" If so, ask: "When was the last time?" "What happened that time?"     ongoing 10. OTHER SYMPTOMS: "Do you have any other symptoms?" (e.g., chest pain, difficulty breathing)       No shortness of breath 11. PREGNANCY: "Is there any chance you are pregnant?" "When was your last menstrual period?"       no  Protocols used: LEG SWELLING AND EDEMA-A-AH

## 2017-01-14 NOTE — Telephone Encounter (Signed)
Spoke with Marlowe Kays to give verbal orders per MD

## 2017-01-14 NOTE — Telephone Encounter (Signed)
Agree to [employer] therapy.

## 2017-01-18 ENCOUNTER — Telehealth: Payer: Self-pay | Admitting: Internal Medicine

## 2017-01-18 NOTE — Telephone Encounter (Signed)
Is this something that needs to be extended?

## 2017-01-18 NOTE — Telephone Encounter (Signed)
Copied from [place] [id number]. Topic: Quick Communication - See Telephone Encounter >> Jan 18, 2017  3:14 PM Burnis Medin, NT wrote: CRM for notification. See Telephone encounter for: Pt is calling because pt said she gets home health care  and she needs help with her baths. Pt said she only have one more visit and she is not able to do it on her own and request the services be continued. [facility] is the agency she uses. Pt would like a call back.  01/18/17.

## 2017-01-18 NOTE — Telephone Encounter (Signed)
Yes, extend however long it can be covered,

## 2017-01-19 NOTE — Telephone Encounter (Signed)
Spoke with Marlowe Kays from [facility] and she states that the pt is not due for discharge, once the pt is due for discharge they will contact us if a recert is needed. Called pt and informed her. She states her last Aid visit is Dec 24th. Advised her to talk with the aid then and request a recert of care.

## 2017-02-10 ENCOUNTER — Other Ambulatory Visit: Payer: Self-pay | Admitting: Obstetrics & Gynecology

## 2017-02-10 DIAGNOSIS — Z1231 Encounter for screening mammogram for malignant neoplasm of breast: Secondary | ICD-10-CM

## 2017-02-16 ENCOUNTER — Encounter: Payer: Self-pay | Admitting: Sports Medicine

## 2017-02-16 ENCOUNTER — Telehealth: Payer: Self-pay | Admitting: *Deleted

## 2017-02-16 ENCOUNTER — Ambulatory Visit: Payer: Medicare Other

## 2017-02-16 ENCOUNTER — Ambulatory Visit: Payer: Medicare Other | Admitting: Sports Medicine

## 2017-02-16 DIAGNOSIS — M2142 Flat foot [pes planus] (acquired), left foot: Secondary | ICD-10-CM

## 2017-02-16 DIAGNOSIS — E1142 Type 2 diabetes mellitus with diabetic polyneuropathy: Secondary | ICD-10-CM

## 2017-02-16 DIAGNOSIS — M2141 Flat foot [pes planus] (acquired), right foot: Secondary | ICD-10-CM

## 2017-02-16 DIAGNOSIS — L84 Corns and callosities: Secondary | ICD-10-CM

## 2017-02-16 DIAGNOSIS — B351 Tinea unguium: Secondary | ICD-10-CM | POA: Diagnosis not present

## 2017-02-16 DIAGNOSIS — M79675 Pain in left toe(s): Secondary | ICD-10-CM

## 2017-02-16 DIAGNOSIS — M79674 Pain in right toe(s): Secondary | ICD-10-CM

## 2017-02-16 DIAGNOSIS — I890 Lymphedema, not elsewhere classified: Secondary | ICD-10-CM | POA: Diagnosis not present

## 2017-02-16 NOTE — Telephone Encounter (Signed)
Orders for home health aide faxed to [facility].

## 2017-02-16 NOTE — Progress Notes (Signed)
Subjective: April [name] is a 67 y.o. female patient with history of diabetes who returns to office today complaining of long, painful nails while ambulating in shoes; unable to trim. Patient states that the glucose reading this morning was 120. Last A1c 5.7. Reports having difficulty with self care and has some discomfort in legs. No other issues.   Patient Active Problem List   Diagnosis Date Noted  . Wheeze 09/11/2016  . Cough 09/11/2016  . Left shoulder pain 08/14/2016  . Meralgia paresthetica 07/16/2016  . Chronic left-sided low back pain with left-sided sciatica 06/02/2016  . Venous stasis ulcer ([place]) 05/13/2016  . Whole body pain 03/20/2016  . Peripheral polyneuropathy 03/20/2016  . Diabetic peripheral neuropathy ([place]) 03/20/2016  . Osteopenia 01/11/2016  . CKD (chronic kidney disease) stage 3, GFR 30-59 ml/min (HCC) 01/02/2016  . Hand paresthesia 07/18/2015  . Carpal tunnel syndrome 06/07/2015  . Family history of colon cancer   . Diabetes mellitus with neurological manifestations ([place]) 04/18/2015  . Bilateral leg edema 04/11/2015  . Cellulitis of leg, right 04/11/2015  . Abnormality of gait 01/03/2015  . Hereditary and idiopathic peripheral neuropathy 01/03/2015  . GERD (gastroesophageal reflux disease) 06/17/2014  . Morbid obesity with BMI of 50.0-59.9, adult ([place]) 09/05/2013  . Hx of colonic polyps 12/15/2012  . Vitamin B12 deficiency 11/03/2012  . Intrinsic asthma 03/23/2012  . Chronic diastolic heart failure ([place]) 02/20/2011  . OSA (obstructive sleep apnea) 09/17/2010  . URINARY URGENCY 01/08/2010  . CAD, NATIVE VESSEL 11/20/2008  . OSTEOARTHRITIS, KNEES, BILATERAL, SEVERE 06/14/2008  . Hyperlipidemia 05/10/2007  . Essential hypertension 01/18/2007  . HYPOKALEMIA 04/30/2006  . HX, PERSONAL, PEPTIC ULCER DISEASE 04/30/2006   Current Outpatient Medications on File Prior to Visit  Medication Sig Dispense Refill  . albuterol (PROVENTIL HFA) 108 (90 Base) MCG/ACT  inhaler Inhale 2 puffs into the lungs every 4 (four) hours as needed for wheezing or shortness of breath. 1 Inhaler 4  . Amino Acids-Protein Hydrolys (FEEDING SUPPLEMENT, PRO-STAT SUGAR FREE 64,) LIQD Take 30 mLs by mouth 3 (three) times daily with meals. 2700 mL 3  . aspirin (ECOTRIN LOW STRENGTH) 81 MG EC tablet Take 81 mg by mouth at bedtime.     Marland Kitchen atorvastatin (LIPITOR) 20 MG tablet Take 1 tablet (20 mg total) by mouth daily. 30 tablet 0  . budesonide-formoterol (SYMBICORT) 160-4.5 MCG/ACT inhaler one - two inhalations every 12 hours; gargle and spit after use (Patient taking differently: Inhale 2 puffs into the lungs 2 (two) times daily as needed (shortness of breath). gargle and spit after use) 1 Inhaler 4  . carvedilol (COREG) 25 MG tablet take 1 tablet by mouth twice a day with meals 60 tablet 5  . clotrimazole (LOTRIMIN) 1 % external solution Apply 1 application topically 2 (two) times daily. In between toes 60 mL 5  . Cod Liver Oil CAPS Take 1 capsule by mouth daily.     . cyanocobalamin (,VITAMIN B-12,) 1000 MCG/ML injection INJECT 1 ML INTO MUSCLE EVERY 30 DAYS 2 mL 2  . diclofenac sodium (VOLTAREN) 1 % GEL Apply 2 g topically 4 (four) times daily. 100 g 11  . docusate sodium (COLACE) 100 MG capsule Take 2-3 capsules (200-300 mg total) by mouth daily. 90 capsule 11  . DULoxetine (CYMBALTA) 60 MG capsule Take 1 capsule (60 mg total) by mouth daily. 30 capsule 12  . Flaxseed, Linseed, (FLAX SEED OIL) 1000 MG CAPS Take 1,000 mg by mouth 2 (two) times daily.     Marland Kitchen  fluticasone (FLONASE) 50 MCG/ACT nasal spray Place 2 sprays into both nostrils daily. 16 g 0  . folic acid (FOLVITE) 578 MCG tablet Take 800 mcg by mouth at bedtime.     . furosemide (LASIX) 40 MG tablet Take 3 tablets (120 mg total) by mouth 2 (two) times daily. 360 tablet 5  . Lancets (ONETOUCH ULTRASOFT) lancets Use to help check blood sugars twice a day Dx E11.9 100 each 3  . lidocaine (GLYDO) 2 % jelly Apply 2 grams to  affected area, if needed. 30 mL 11  . LYRICA 50 MG capsule take 1 capsule by mouth twice a day 60 capsule 3  . Multiple Vitamin (MULTIVITAMIN) tablet Take 1 tablet by mouth daily.      Marland Kitchen NEEDLE, DISP, 23 G (EASY TOUCH FLIPLOCK NEEDLES) 23G X 1-1/2" MISC Use to inject B12 once monthly. 2 each 0  . nystatin cream (MYCOSTATIN) Apply 1 application topically 2 (two) times daily. 30 g 2  . Omega-3 Fatty Acids (FISH OIL) 1000 MG CAPS Take 1,000 mg by mouth daily.     Marland Kitchen omeprazole (PRILOSEC) 40 MG capsule Take 1 capsule (40 mg total) by mouth 2 (two) times daily. 60 capsule 6  . potassium chloride SA (K-DUR,KLOR-CON) 20 MEQ tablet take 3 tablets by mouth once daily 270 tablet 1  . RA COL-RITE 100 MG capsule take 1 capsule by mouth twice a day if needed for MILD CONSTIPATION 60 capsule 0  . simvastatin (ZOCOR) 40 MG tablet take 1 tablet by mouth at bedtime 90 tablet 3  . spironolactone (ALDACTONE) 25 MG tablet Take 1 tablet (25 mg total) by mouth daily. 30 tablet 5  . SYRINGE-NEEDLE, DISP, 3 ML 23G X 1" 3 ML MISC Use with B12 to inject IM every 30 days 50 each 0  . topiramate (TOPAMAX) 100 MG tablet Take 1 tablet (100 mg total) by mouth 2 (two) times daily. 60 tablet 11   No current facility-administered medications on file prior to visit.    Allergies  Allergen Reactions  . Penicillins Rash    She was told not to take it anymore. Has patient had a PCN reaction causing immediate rash, facial/tongue/throat swelling, SOB or lightheadedness with hypotension: Yes Has patient had a PCN reaction causing severe rash involving mucus membranes or skin necrosis: No Has patient had a PCN reaction that required hospitalization: No Has patient had a PCN reaction occurring within the last 10 years: No If all of the above answers are "NO", then may proceed with Cephalosporin use.'  . Sulfonamide Derivatives Anaphylaxis    REACTION: internal "burning"  . Benazepril Hcl Other (See Comments)    Patient unsure of  reaction.  . Hydrochlorothiazide W-Triamterene Other (See Comments)    Hypokalemia  . Metronidazole Other (See Comments)    Patient unsure of reaction.  Marland Kitchen Spironolactone     "kidney problems"  . Torsemide Other (See Comments)    Patient unsure of reaction.  . Valsartan Other (See Comments)    Patient unsure of reaction.    Recent Results (from the past 2160 hour(s))  Magnesium     Status: None   Collection Time: 12/11/16  2:53 PM  Result Value Ref Range   Magnesium 2.5 1.5 - 2.5 mg/dL  TSH     Status: None   Collection Time: 12/11/16  2:53 PM  Result Value Ref Range   TSH 1.00 0.35 - 4.50 uIU/mL  Lipid panel     Status: Abnormal   Collection  Time: 12/11/16  2:53 PM  Result Value Ref Range   Cholesterol 183 0 - 200 mg/dL    Comment: ATP III Classification       Desirable:  < 200 mg/dL               Borderline High:  200 - 239 mg/dL          High:  > = 240 mg/dL   Triglycerides 72.0 0.0 - 149.0 mg/dL    Comment: Normal:  <150 mg/dLBorderline High:  150 - 199 mg/dL   HDL 40.70 >39.00 mg/dL   VLDL 14.4 0.0 - 40.0 mg/dL   LDL Cholesterol 128 (H) 0 - 99 mg/dL   Total CHOL/HDL Ratio 5     Comment:                Men          Women1/2 Average Risk     3.4          3.3Average Risk          5.0          4.42X Average Risk          9.6          7.13X Average Risk          15.0          11.0                       NonHDL 142.69     Comment: NOTE:  Non-HDL goal should be 30 mg/dL higher than patient's LDL goal (i.e. LDL goal of < 70 mg/dL, would have non-HDL goal of < 100 mg/dL)  Hemoglobin A1c     Status: None   Collection Time: 12/11/16  2:53 PM  Result Value Ref Range   Hgb A1c MFr Bld 5.7 4.6 - 6.5 %    Comment: Glycemic Control Guidelines for People with Diabetes:Non Diabetic:  <6%Goal of Therapy: <7%Additional Action Suggested:  >8%   Comprehensive metabolic panel     Status: Abnormal   Collection Time: 12/11/16  2:53 PM  Result Value Ref Range   Sodium 142 135 - 145 mEq/L    Potassium 3.5 3.5 - 5.1 mEq/L   Chloride 103 96 - 112 mEq/L   CO2 31 19 - 32 mEq/L   Glucose, Bld 108 (H) 70 - 99 mg/dL   BUN 30 (H) 6 - 23 mg/dL   Creatinine, Ser 1.33 (H) 0.40 - 1.20 mg/dL   Total Bilirubin 0.5 0.2 - 1.2 mg/dL   Alkaline Phosphatase 75 39 - 117 U/L   AST 13 0 - 37 U/L   ALT 10 0 - 35 U/L   Total Protein 7.5 6.0 - 8.3 g/dL   Albumin 4.0 3.5 - 5.2 g/dL   Calcium 9.9 8.4 - 10.5 mg/dL   GFR 51.25 (L) >60.00 mL/min  CBC with Differential/Platelet     Status: Abnormal   Collection Time: 12/11/16  2:53 PM  Result Value Ref Range   WBC 8.6 4.0 - 10.5 K/uL   RBC 4.49 3.87 - 5.11 Mil/uL   Hemoglobin 12.9 12.0 - 15.0 g/dL   HCT 40.6 36.0 - 46.0 %   MCV 90.6 78.0 - 100.0 fl   MCHC 31.8 30.0 - 36.0 g/dL   RDW 15.0 11.5 - 15.5 %   Platelets 161.0 150.0 - 400.0 K/uL   Neutrophils Relative % 78.1 (H) 43.0 - 77.0 %  Lymphocytes Relative 15.3 12.0 - 46.0 %   Monocytes Relative 6.3 3.0 - 12.0 %   Eosinophils Relative 0.1 0.0 - 5.0 %   Basophils Relative 0.2 0.0 - 3.0 %   Neutro Abs 6.7 1.4 - 7.7 K/uL   Lymphs Abs 1.3 0.7 - 4.0 K/uL   Monocytes Absolute 0.5 0.1 - 1.0 K/uL   Eosinophils Absolute 0.0 0.0 - 0.7 K/uL   Basophils Absolute 0.0 0.0 - 0.1 K/uL    Objective: General: Patient is awake, alert, and oriented x 3 and in no acute distress.  Integument: Skin is warm, dry and supple bilateral. Nails are tender, long, thickened and dystrophic with subungual debris, consistent with onychomycosis, 1-5 bilateral. No signs of infection. No open lesions or preulcerative lesions present bilateral. Unna boots on legs bilateral patient goes to wound care center. Minimal Callus to heels bilateral and nothing in between toes. Remaining integument unremarkable.  Vasculature:  Dorsalis Pedis pulse 0/4 bilateral. Posterior Tibial pulse  0/4 bilateral. Capillary fill time <5 sec 1-5 bilateral. No hair growth to the level of the digits.Temperature gradient within normal limits. No  varicosities present bilateral. Chronic edema present bilateral.   Neurology: The patient has diminished sensation measured with a 5.07/10g Semmes Weinstein Monofilament at all pedal sites bilateral . Vibratory sensation diminished bilateral with tuning fork. No Babinski sign present bilateral.   Musculoskeletal: Right 5th hammertoe and Asymptomatic pes planus pedal deformities noted bilateral. Muscular strength 4/5 in all lower extremity muscular groups bilateral without pain on range of motion. No tenderness with calf compression bilateral.  Assessment and Plan: Problem List Items Addressed This Visit    None    Visit Diagnoses    Dermatophytosis of nail    -  Primary   Diabetic polyneuropathy associated with type 2 diabetes mellitus (HCC)       Toe pain, bilateral       Lymphedema       Pes planus of both feet       Callus of heel         -Examined patient. -Discussed and educated patient on diabetic foot care, especially with  regards to the vascular, neurological and musculoskeletal systems.  -Stressed the importance of good glycemic control and the detriment of not  controlling glucose levels in relation to the foot. -Mechanically debrided all nails 1-5 bilateral using sterile nail nipper and filed with dremel without incident  -Dry callus to heels; recommend continue with skin emollients as previous [place] healthy feet cream for dry skin -[employer] diabetic shoe order form was completed; office to contact primary care for approval / certification;  Office to arrange shoe fitting and dispensing. -Requested home health aide from [facility] for self care since patient has physical difficulties in doing so   -Answered all patient questions -Patient to return  in 3 months for at risk foot care -Patient advised to call the office if any problems or questions arise in the meantime.  Landis Martins, DPM

## 2017-02-16 NOTE — Telephone Encounter (Signed)
-----   Message from Landis Martins, [place] sent at 02/16/2017  2:35 PM EST ----- Regarding: [facility] [facility] Needs assistance with baths and skin care

## 2017-02-17 ENCOUNTER — Telehealth: Payer: Self-pay | Admitting: Sports Medicine

## 2017-02-17 NOTE — Telephone Encounter (Signed)
[place] aide is covered due to pt receiving skilled nursing from [facility].

## 2017-02-17 NOTE — Telephone Encounter (Signed)
This is [name] one of the clinical supervisors with [facility]. We just received orders on [name] for a home health aid. We have been providing home health services for nursing for her so for the home health aid we can provide that up to twice a week instead of three times a week. So I wanted to let you know we will alter that order to twice a week. If you have any questions, feel free to give [place] a call back at [phone]. Thanks. Bye bye.

## 2017-03-01 ENCOUNTER — Telehealth: Payer: Self-pay | Admitting: Internal Medicine

## 2017-03-01 NOTE — Telephone Encounter (Signed)
Spoke with Will. He is not certain that is a fluid overload but will have pt contact her Nephrologist.

## 2017-03-01 NOTE — Telephone Encounter (Signed)
Have them call her nephrologist - they are managing her water pills

## 2017-03-01 NOTE — Telephone Encounter (Signed)
Copied from [place] [phone]. Topic: General - Other >> Mar 01, 2017  2:41 PM Neva Seat wrote: [place] [facility] [phone]  Pt's Weight 365.2 Established weight 350

## 2017-03-18 ENCOUNTER — Ambulatory Visit: Payer: Medicare Other

## 2017-03-19 ENCOUNTER — Ambulatory Visit: Payer: Medicare Other

## 2017-03-22 ENCOUNTER — Telehealth: Payer: Self-pay | Admitting: Internal Medicine

## 2017-03-22 ENCOUNTER — Ambulatory Visit: Payer: Self-pay | Admitting: *Deleted

## 2017-03-22 NOTE — Telephone Encounter (Signed)
Called patient as well &LVM. She will need an appointment to get a ABX.

## 2017-03-22 NOTE — Telephone Encounter (Addendum)
Called patient to clarify her symptoms. She left the message of :  she would like to know if Dr.Burns could call her in some medication for her Bronchitis. Stated that when she blows her nose its clear, but she is coughing up yellow mucus and wheezing. Also she is having a lot of coughing that is causing her not to sleep and would like cough medication as well. She would like the medication to be sent to the [facility]. [place] [phone]         Did not get an answer, message left for her to give [place] a call back.

## 2017-03-22 NOTE — Telephone Encounter (Signed)
Called pt. To follow up on her symptoms.  Stated she has had a cough about a week.  Stated it is "severe at times".  Also c/o intermittent wheezing, sore throat, and ears hurting.  Stated she has some shortness of breath.  Reported fever today of 99.0 degrees.  Reported she has been using Robitussin with honey, and that it has helped a little bit.  Reported she is coughing up yellow mucus for the past few days.  Voiced concern of developing bronchitis.  Reported her son and husband have been sick; her husband has an appt. today at 3:45 PM.  Advised she will need to be seen in the office, also, to evaluate and recommend treatment.  Appt. With PCP given  for 10:00 AM, 03/23/17.  Care advice given per protocol. Verb. Understanding.   Reason for Disposition . SEVERE coughing spells (e.g., whooping sound after coughing, vomiting after coughing)  Answer Assessment - Initial Assessment Questions 1. ONSET: "When did the cough begin?"      Last week 2. SEVERITY: "How bad is the cough today?"     "Severe at times" 3. RESPIRATORY DISTRESS: "Describe your breathing."      Some shortness of breath 4. FEVER: "Do you have a fever?" If so, ask: "What is your temperature, how was it measured, and when did it start?"     99.0 5. SPUTUM: "Describe the color of your sputum" (clear, white, yellow, green)    Coughing up yellow mucus for a few days. 6. HEMOPTYSIS: "Are you coughing up any blood?" If so ask: "How much?" (flecks, streaks, tablespoons, etc.)     No 7. CARDIAC HISTORY: "Do you have any history of heart disease?" (e.g., heart attack, congestive heart failure)      Congestive heart failure 8. LUNG HISTORY: "Do you have any history of lung disease?"  (e.g., pulmonary embolus, asthma, emphysema)     asthma 9. PE RISK FACTORS: "Do you have a history of blood clots?" (or: recent major surgery, recent prolonged travel, bedridden )     no 10. OTHER SYMPTOMS: "Do you have any other symptoms?" (e.g., runny nose,  wheezing, chest pain)       Nasal congestion, low grade fever, sore throat, and ears hurt, has low grade temp.   11. PREGNANCY: "Is there any chance you are pregnant?" "When was your last menstrual period?"       No 12. TRAVEL: "Have you traveled out of the country in the last month?" (e.g., travel history, exposures)       No  Protocols used: [place]

## 2017-03-22 NOTE — Progress Notes (Signed)
Subjective:    Patient ID: April [name], female    DOB: [birth year], 67 y.o.   MRN: [id number]  HPI She is here for an acute visit for cold symptoms.  Her symptoms started last week.   She is experiencing fevers, chills, fatigue, nasal congestion, ear pain, sore throat, watery eyes, productive cough of yellow sputum, shortness of breath, wheezing, little bit of diarrhea and headaches.  She has not noticed any runny nose, sinus pain, nausea or body aches.  Both her son and husband have been sick.  She has taken Robitussin, cough drops, tea/fluids/juices, asthma inhalers  Medications and allergies reviewed with patient and updated if appropriate.  Patient Active Problem List   Diagnosis Date Noted  . Wheeze 09/11/2016  . Cough 09/11/2016  . Left shoulder pain 08/14/2016  . Meralgia paresthetica 07/16/2016  . Chronic left-sided low back pain with left-sided sciatica 06/02/2016  . Venous stasis ulcer ([place]) 05/13/2016  . Whole body pain 03/20/2016  . Peripheral polyneuropathy 03/20/2016  . Diabetic peripheral neuropathy ([place]) 03/20/2016  . Osteopenia 01/11/2016  . CKD (chronic kidney disease) stage 3, GFR 30-59 ml/min (HCC) 01/02/2016  . Hand paresthesia 07/18/2015  . Carpal tunnel syndrome 06/07/2015  . Family history of colon cancer   . Diabetes mellitus with neurological manifestations ([place]) 04/18/2015  . Bilateral leg edema 04/11/2015  . Cellulitis of leg, right 04/11/2015  . Abnormality of gait 01/03/2015  . Hereditary and idiopathic peripheral neuropathy 01/03/2015  . GERD (gastroesophageal reflux disease) 06/17/2014  . Morbid obesity with BMI of 50.0-59.9, adult ([place]) 09/05/2013  . Hx of colonic polyps 12/15/2012  . Vitamin B12 deficiency 11/03/2012  . Intrinsic asthma 03/23/2012  . Chronic diastolic heart failure ([place]) 02/20/2011  . OSA (obstructive sleep apnea) 09/17/2010  . URINARY URGENCY 01/08/2010  . CAD, NATIVE VESSEL 11/20/2008  . OSTEOARTHRITIS, KNEES,  BILATERAL, SEVERE 06/14/2008  . Hyperlipidemia 05/10/2007  . Essential hypertension 01/18/2007  . HYPOKALEMIA 04/30/2006  . HX, PERSONAL, PEPTIC ULCER DISEASE 04/30/2006    Current Outpatient Medications on File Prior to Visit  Medication Sig Dispense Refill  . albuterol (PROVENTIL HFA) 108 (90 Base) MCG/ACT inhaler Inhale 2 puffs into the lungs every 4 (four) hours as needed for wheezing or shortness of breath. 1 Inhaler 4  . Amino Acids-Protein Hydrolys (FEEDING SUPPLEMENT, PRO-STAT SUGAR FREE 64,) LIQD Take 30 mLs by mouth 3 (three) times daily with meals. 2700 mL 3  . aspirin (ECOTRIN LOW STRENGTH) 81 MG EC tablet Take 81 mg by mouth at bedtime.     Marland Kitchen atorvastatin (LIPITOR) 20 MG tablet Take 1 tablet (20 mg total) by mouth daily. 30 tablet 0  . budesonide-formoterol (SYMBICORT) 160-4.5 MCG/ACT inhaler one - two inhalations every 12 hours; gargle and spit after use (Patient taking differently: Inhale 2 puffs into the lungs 2 (two) times daily as needed (shortness of breath). gargle and spit after use) 1 Inhaler 4  . carvedilol (COREG) 25 MG tablet take 1 tablet by mouth twice a day with meals 60 tablet 5  . clotrimazole (LOTRIMIN) 1 % external solution Apply 1 application topically 2 (two) times daily. In between toes 60 mL 5  . Cod Liver Oil CAPS Take 1 capsule by mouth daily.     . cyanocobalamin (,VITAMIN B-12,) 1000 MCG/ML injection INJECT 1 ML INTO MUSCLE EVERY 30 DAYS 2 mL 2  . diclofenac sodium (VOLTAREN) 1 % GEL Apply 2 g topically 4 (four) times daily. 100 g 11  . docusate sodium (  COLACE) 100 MG capsule Take 2-3 capsules (200-300 mg total) by mouth daily. 90 capsule 11  . DULoxetine (CYMBALTA) 60 MG capsule Take 1 capsule (60 mg total) by mouth daily. 30 capsule 12  . Flaxseed, Linseed, (FLAX SEED OIL) 1000 MG CAPS Take 1,000 mg by mouth 2 (two) times daily.     . fluticasone (FLONASE) 50 MCG/ACT nasal spray Place 2 sprays into both nostrils daily. 16 g 0  . folic acid (FOLVITE)  025 MCG tablet Take 800 mcg by mouth at bedtime.     . furosemide (LASIX) 40 MG tablet Take 3 tablets (120 mg total) by mouth 2 (two) times daily. 360 tablet 5  . Lancets (ONETOUCH ULTRASOFT) lancets Use to help check blood sugars twice a day Dx E11.9 100 each 3  . lidocaine (GLYDO) 2 % jelly Apply 2 grams to affected area, if needed. 30 mL 11  . LYRICA 50 MG capsule take 1 capsule by mouth twice a day 60 capsule 3  . Multiple Vitamin (MULTIVITAMIN) tablet Take 1 tablet by mouth daily.      Marland Kitchen NEEDLE, DISP, 23 G (EASY TOUCH FLIPLOCK NEEDLES) 23G X 1-1/2" MISC Use to inject B12 once monthly. 2 each 0  . nystatin cream (MYCOSTATIN) Apply 1 application topically 2 (two) times daily. 30 g 2  . Omega-3 Fatty Acids (FISH OIL) 1000 MG CAPS Take 1,000 mg by mouth daily.     Marland Kitchen omeprazole (PRILOSEC) 40 MG capsule Take 1 capsule (40 mg total) by mouth 2 (two) times daily. 60 capsule 6  . potassium chloride SA (K-DUR,KLOR-CON) 20 MEQ tablet take 3 tablets by mouth once daily 270 tablet 1  . RA COL-RITE 100 MG capsule take 1 capsule by mouth twice a day if needed for MILD CONSTIPATION 60 capsule 0  . simvastatin (ZOCOR) 40 MG tablet take 1 tablet by mouth at bedtime 90 tablet 3  . spironolactone (ALDACTONE) 25 MG tablet Take 1 tablet (25 mg total) by mouth daily. 30 tablet 5  . SYRINGE-NEEDLE, DISP, 3 ML 23G X 1" 3 ML MISC Use with B12 to inject IM every 30 days 50 each 0  . topiramate (TOPAMAX) 100 MG tablet Take 1 tablet (100 mg total) by mouth 2 (two) times daily. 60 tablet 11   No current facility-administered medications on file prior to visit.     Past Medical History:  Diagnosis Date  . Anemia   . Asthma   . CAD (coronary artery disease)   . Carpal tunnel syndrome   . Cellulitis of both lower extremities 04/11/2015  . CHF (congestive heart failure) ([place])   . Colon polyp, hyperplastic 2007 & 2012  . Complication of anesthesia 1999   svt with renal calculi surgery, no problems since  . CTS  (carpal tunnel syndrome)   . Diabetes mellitus   . Eczema   . Hyperlipidemia   . Hypertension   . Leg ulcer ([place]) 04/24/2015   Right lateral leg No evidence of an infection Monitor closely Keep edema controlled   . Meralgia paresthetica    Dr. [name]  . Morbid obesity ([place])   . Morbid obesity ([place])   . Neuropathy    toes and legs  . Osteoarthrosis, unspecified whether generalized or localized, lower leg   . PUD (peptic ulcer disease)   . Shortness of breath dyspnea    with exertion  . Type II or unspecified type diabetes mellitus without mention of complication, not stated as uncontrolled   . Unspecified  hereditary and idiopathic peripheral neuropathy   . Vitamin B12 deficiency   . Wound cellulitis    right upper leg, healing well    Past Surgical History:  Procedure Laterality Date  . ABDOMINAL HYSTERECTOMY    . CARDIAC CATHETERIZATION  2002   non obstructive disease  . colonoscopy with polypectomy  2007 & 2012    hyperplastic ;Dr Watt Climes  . COLONOSCOPY WITH PROPOFOL N/A 06/04/2015   Procedure: COLONOSCOPY WITH PROPOFOL;  Surgeon: Jerene Bears, MD;  Location: [facility];  Service: Gastroenterology;  Laterality: N/A;  . DILATION AND CURETTAGE OF UTERUS     multiple  . HEMORRHOID SURGERY    . renal calculi  12/1997   SVT with induction of anesthesia  . right knee arthroscopy    . TONSILLECTOMY AND ADENOIDECTOMY      Social History   Socioeconomic History  . Marital status: Married    Spouse name: [name]  . Number of children: 1  . Years of education: BS  . Highest education level: None  Social Needs  . Financial resource strain: None  . Food insecurity - worry: None  . Food insecurity - inability: None  . Transportation needs - medical: None  . Transportation needs - non-medical: None  Occupational History  . Occupation: Disabled    Employer: RETIRED  Tobacco Use  . Smoking status: Never Smoker  . Smokeless tobacco: Never Used  Substance and Sexual Activity  .  Alcohol use: No    Alcohol/week: 0.0 oz  . Drug use: No  . Sexual activity: None  Other Topics Concern  . None  Social History Narrative   Patient lives at home with her husband [name] Patient is retired and has a Conservation officer, nature.    Caffeine - some times.   Right handed.    Family History  Problem Relation Age of Onset  . Colon cancer Mother   . Prostate cancer Father   . Colon cancer Father   . Diabetes Maternal Aunt   . Breast cancer Maternal Aunt   . Diabetes Maternal Uncle   . Diabetes Paternal Aunt   . Stroke Paternal Aunt        > 65  . Heart disease Paternal Aunt   . Diabetes Paternal Uncle   . Breast cancer Maternal Aunt         X 2  . Breast cancer Cousin     Review of Systems  Constitutional: Positive for chills, fatigue and fever.  HENT: Positive for congestion (clear mucus), ear pain and sore throat. Negative for rhinorrhea and sinus pain.   Eyes: Positive for discharge.  Respiratory: Positive for cough (productive yellow sputum), shortness of breath and wheezing.   Gastrointestinal: Positive for diarrhea (some). Negative for nausea.  Musculoskeletal: Negative for myalgias.  Neurological: Positive for headaches.       Objective:   Vitals:   03/23/17 0911  BP: (!) 134/96  Pulse: 84  Resp: 16  Temp: 99 F (37.2 C)  SpO2: 95%   Filed Weights   03/23/17 0911  Weight: (!) 361 lb (163.7 kg)   Body mass index is 60.07 kg/m.  Wt Readings from Last 3 Encounters:  03/23/17 (!) 361 lb (163.7 kg)  12/11/16 (!) 355 lb (161 kg)  08/14/16 (!) 351 lb (159.2 kg)     Physical Exam GENERAL APPEARANCE: Appears stated age, well appearing, NAD EYES: conjunctiva clear, no icterus HEENT: bilateral tympanic membranes and ear canals normal, oropharynx with mild erythema,  no thyromegaly, trachea midline, no cervical or supraclavicular lymphadenopathy LUNGS: unlabored breathing, good air entry bilaterally, diffuse wheezing on expiration, no obvious  crackles CARDIOVASCULAR: Normal S1,S2 without murmurs SKIN: warm, dry      Assessment & Plan:   See Problem List for Assessment and Plan of chronic medical problems.

## 2017-03-23 ENCOUNTER — Ambulatory Visit: Payer: Medicare Other | Admitting: Internal Medicine

## 2017-03-23 ENCOUNTER — Encounter: Payer: Self-pay | Admitting: Internal Medicine

## 2017-03-23 ENCOUNTER — Ambulatory Visit (INDEPENDENT_AMBULATORY_CARE_PROVIDER_SITE_OTHER)
Admission: RE | Admit: 2017-03-23 | Discharge: 2017-03-23 | Disposition: A | Discharge status: Home / Self Care | Payer: Medicare Other | Source: Ambulatory Visit | Attending: Internal Medicine | Admitting: Internal Medicine

## 2017-03-23 VITALS — BP 134/96 | HR 84 | Temp 99.0°F | Resp 16 | Wt 361.0 lb

## 2017-03-23 DIAGNOSIS — J45901 Unspecified asthma with (acute) exacerbation: Secondary | ICD-10-CM | POA: Insufficient documentation

## 2017-03-23 DIAGNOSIS — J4541 Moderate persistent asthma with (acute) exacerbation: Secondary | ICD-10-CM | POA: Diagnosis not present

## 2017-03-23 DIAGNOSIS — J45909 Unspecified asthma, uncomplicated: Secondary | ICD-10-CM | POA: Insufficient documentation

## 2017-03-23 DIAGNOSIS — J4540 Moderate persistent asthma, uncomplicated: Secondary | ICD-10-CM | POA: Insufficient documentation

## 2017-03-23 MED ORDER — PREDNISONE 10 MG PO TABS: ORAL_TABLET | ORAL | 0 refills | Status: DC

## 2017-03-23 MED ORDER — METHYLPREDNISOLONE ACETATE 80 MG/ML IJ SUSP
80.0000 mg | Freq: Once | INTRAMUSCULAR | Status: AC
  Administered 2017-03-23: 80 mg via INTRAMUSCULAR

## 2017-03-23 MED ORDER — ALBUTEROL SULFATE (2.5 MG/3ML) 0.083% IN NEBU: 2.5000 mg | INHALATION_SOLUTION | Freq: Four times a day (QID) | RESPIRATORY_TRACT | 12 refills | Status: DC | PRN

## 2017-03-23 MED ORDER — IPRATROPIUM-ALBUTEROL 0.5-2.5 (3) MG/3ML IN SOLN
3.0000 mL | Freq: Once | RESPIRATORY_TRACT | Status: AC
  Administered 2017-03-23: 3 mL via RESPIRATORY_TRACT

## 2017-03-23 MED ORDER — AZITHROMYCIN 250 MG PO TABS: ORAL_TABLET | ORAL | 0 refills | Status: DC

## 2017-03-23 MED ORDER — HYDROCOD POLST-CPM POLST ER 10-8 MG/5ML PO SUER: 5.0000 mL | Freq: Two times a day (BID) | ORAL | 0 refills | Status: DC | PRN

## 2017-03-23 MED ORDER — CEFPODOXIME PROXETIL 200 MG PO TABS: 200.0000 mg | ORAL_TABLET | Freq: Two times a day (BID) | ORAL | 0 refills | Status: DC

## 2017-03-23 NOTE — Addendum Note (Signed)
Addended by: [name] on: 03/23/2017 01:15 PM   Modules accepted: Orders

## 2017-03-23 NOTE — Patient Instructions (Addendum)
You received a steroid injection today.   Start the prednisone oral taper tomorrow.  Take both antibiotics as prescribed.  Use the cough syrup as directed.  We will get you a nebulizer machine for home.  The liquid albuterol medication was sent to your pharmacy.    Rest, continue increased fluids.   Call if no improvement

## 2017-03-23 NOTE — Assessment & Plan Note (Signed)
She has significant wheezing on exam, consistent with asthmatic exacerbation due to bronchitis Chest x-ray today to rule out pneumonia Nebulizer treatment here and we will order a nebulizer machine for her home Depo-Medrol 80 mg IM x1 Start prednisone oral taper tomorrow Start Z-Pak/Vantin Tussionex cough syrup Continue increase fluids, over-the-counter cold medications Call if no improvement

## 2017-03-25 ENCOUNTER — Ambulatory Visit
Admission: RE | Admit: 2017-03-25 | Discharge: 2017-03-25 | Disposition: A | Discharge status: Home / Self Care | Payer: Medicare Other | Source: Ambulatory Visit | Attending: Obstetrics & Gynecology | Admitting: Obstetrics & Gynecology

## 2017-03-25 DIAGNOSIS — Z1231 Encounter for screening mammogram for malignant neoplasm of breast: Secondary | ICD-10-CM

## 2017-03-29 ENCOUNTER — Telehealth: Payer: Self-pay | Admitting: Internal Medicine

## 2017-03-29 ENCOUNTER — Telehealth: Payer: Self-pay | Admitting: Sports Medicine

## 2017-03-29 ENCOUNTER — Other Ambulatory Visit: Payer: Self-pay | Admitting: Internal Medicine

## 2017-03-29 NOTE — Telephone Encounter (Signed)
Pt left voicemail (2.21.19 @ 1227am)checking status of her and her spouses diabetic shoes.  I returned call and left message that we did get paperwork for both but her shoes are on back order until 5.25.19 and to let me know if she wants to wait or maybe change to a different shoe style.

## 2017-03-29 NOTE — Telephone Encounter (Signed)
[place] Controlled Substance Database checked. Last filled on 02/28/17

## 2017-03-29 NOTE — Telephone Encounter (Signed)
Copied from [place] [phone]. Topic: Quick Communication - Rx Refill/Question >> Mar 29, 2017 12:26 PM Robina Ade, Helene Kelp D wrote: Medication: LYRICA 50 MG capsule   Has the patient contacted their pharmacy?Yes   (Agent: If no, request that the patient contact the pharmacy for the refill.)   Preferred Pharmacy (with phone number or street name): [facility], [place]   Agent: Please be advised that RX refills may take up to 3 business days. We ask that you follow-up with your pharmacy.

## 2017-04-02 NOTE — Telephone Encounter (Signed)
Pt did return my call and stated ok to go ahead and order the black shoe instead of the taupe colored one since it was on back order until 5.25.19.  I spoke to [facility] @ [facility] and the black one is in stock and he has ordered that one for her and asked for them to proceed with shipping the inserts as well.  I contacted pt and she is aware we have ordered the black shoe instead and I will call when they come in to schedule an appt to pick them up.

## 2017-04-06 ENCOUNTER — Telehealth: Payer: Self-pay | Admitting: Internal Medicine

## 2017-04-06 DIAGNOSIS — R600 Localized edema: Secondary | ICD-10-CM

## 2017-04-06 NOTE — Telephone Encounter (Signed)
Copied from [place]. Topic: Quick Communication - See Telephone Encounter >> Apr 06, 2017 11:11 AM Arletha Grippe wrote: CRM for notification. See Telephone encounter for:   04/06/17. Marlowe Kays from [facility] called - the will be discontinuing service. The pt's sores are doing well, and  the una wrap will be discontinued.  They will be needing personal care and someone to help her with compression stockings, she also needs a rx for stockings, and will need to be measured.  Cb is [phone] Conie says Pt no longer has a home health need.

## 2017-04-06 NOTE — Telephone Encounter (Signed)
Copied from [place]. Topic: Quick Communication - See Telephone Encounter >> Apr 06, 2017 12:43 PM Vernona Rieger wrote: April [name] wants to know does she have Pernicious enema?  Please advise. Can she get a lymphoedema pump?

## 2017-04-06 NOTE — Telephone Encounter (Signed)
She already has personal care aid I believe.    She has B12 def, not pernicious anemia.    Can provide rx's for compression socks and lymphedema pump - pending - not sure if ordered correctly

## 2017-04-07 NOTE — Telephone Encounter (Signed)
Called April [name] no answer LMOM RTC...April [name]

## 2017-04-08 ENCOUNTER — Other Ambulatory Visit: Payer: Self-pay | Admitting: Internal Medicine

## 2017-04-09 NOTE — Telephone Encounter (Signed)
Called April [name] again no answer and can't leave msg due to vm being full. Sent CRM to [facility] for FYI.Marland KitchenJohny Chess

## 2017-04-15 NOTE — Telephone Encounter (Signed)
Patient called back today regarding the same issue and would like to talk to Dr. Quay Burow or her same. She has some questions for them. Please call patient back, thanks.

## 2017-04-16 ENCOUNTER — Ambulatory Visit: Payer: Medicare Other

## 2017-04-16 NOTE — Telephone Encounter (Signed)
Patient wants an update on this. She said no one has called her back. She said have you talked to [place] yet?

## 2017-04-19 NOTE — Telephone Encounter (Signed)
Patient is calling because she would like to let Dr.Burns know that she would like to get her Pneumatic devicewith sleeve machine from [facility]. They can be reached at [phone] and there fax number is [phone]. Also stated that she would like to have home health  Come out to her home 1 time a week to help with baths. She would like to have [facility] provide that service for her. She would like to speak with someone about this as well and can be reached at [phone]

## 2017-04-19 NOTE — Telephone Encounter (Signed)
Called pt back inform her according to connie msg from last week that they were going to discontinuing service due to sores on legs was doing better. Also that MD ok the prescriptions for the compression stocking and lymphemdema pump. Pt states per her insurance they would cover " Pneumatic device w/sleeve, but she was not sure where to send rx. she states she have called around [place] trying to see who carry it. Inform pt she will have to go to a medical supply store such as :" [facility]". Inform pt I will fax the prescriptions to the [facility] on [address]  Pt states Marlowe Kays is actually coming out tomorrow, and to let MD know that she does not have personal aid services. She would like for someone to come out and help with her baths, and if [facility] can provide the services she would like services w/them.Marland KitchenJohny Chess   Printed rx for the compression stocking and compress device. Called [facility] and verified fax# [phone]

## 2017-04-19 NOTE — Telephone Encounter (Signed)
Refaxed orders to [facility] as pt requested MD is still working on [facility] referral../lmb

## 2017-04-19 NOTE — Addendum Note (Signed)
Addended by: Earnstine Regal on: 04/19/2017 09:48 AM   Modules accepted: Orders

## 2017-04-19 NOTE — Telephone Encounter (Signed)
Spoke with pt to inform that per [facility], insurance will not cover just a HomeHealth Aid. That will have to come out of pocket for the patient through a personal care giver.

## 2017-04-22 ENCOUNTER — Ambulatory Visit: Payer: Medicare Other | Admitting: Orthotics

## 2017-04-27 ENCOUNTER — Ambulatory Visit: Payer: Medicare Other | Admitting: Orthotics

## 2017-04-27 DIAGNOSIS — M79674 Pain in right toe(s): Secondary | ICD-10-CM

## 2017-04-27 DIAGNOSIS — M2142 Flat foot [pes planus] (acquired), left foot: Secondary | ICD-10-CM

## 2017-04-27 DIAGNOSIS — M79675 Pain in left toe(s): Secondary | ICD-10-CM

## 2017-04-27 DIAGNOSIS — I890 Lymphedema, not elsewhere classified: Secondary | ICD-10-CM

## 2017-04-27 DIAGNOSIS — M2141 Flat foot [pes planus] (acquired), right foot: Secondary | ICD-10-CM

## 2017-04-27 NOTE — Progress Notes (Signed)
Shoes reordered due to patient not liking style.

## 2017-04-28 ENCOUNTER — Telehealth: Payer: Self-pay | Admitting: Internal Medicine

## 2017-04-28 NOTE — Telephone Encounter (Signed)
Spoke with pt to inform.  

## 2017-04-28 NOTE — Telephone Encounter (Signed)
Copied from [place] [phone]. Topic: Quick Communication - See Telephone Encounter >> Apr 28, 2017  2:47 PM Burnis Medin, NT wrote: CRM for notification. See Telephone encounter for: 04/28/17. Olivia Mackie is calling to get verbal orders to increase the frequency to twice weekly for the unna  boot for swelling and would like a call back. She would like a call back

## 2017-04-29 ENCOUNTER — Ambulatory Visit: Payer: Medicare Other | Admitting: Orthotics

## 2017-05-03 ENCOUNTER — Other Ambulatory Visit: Payer: Self-pay | Admitting: Internal Medicine

## 2017-05-06 ENCOUNTER — Telehealth: Payer: Self-pay | Admitting: Internal Medicine

## 2017-05-06 LAB — HM DIABETES EYE EXAM

## 2017-05-06 NOTE — Telephone Encounter (Signed)
Noted, no changes needed

## 2017-05-06 NOTE — Telephone Encounter (Signed)
Copied from [place] [phone]. Topic: Quick Communication - See Telephone Encounter >> May 06, 2017  2:12 PM Boyd Kerbs wrote: CRM for notification.   Santiago Glad [facility] - [id number]  reporting weight 365 lbs 05/06/17.   This is her weight last several days.  No respiratory problems, vitals good.   See Telephone encounter for: 05/06/17.

## 2017-05-11 ENCOUNTER — Ambulatory Visit: Payer: Medicare Other

## 2017-05-11 ENCOUNTER — Encounter: Payer: Self-pay | Admitting: Internal Medicine

## 2017-05-13 DIAGNOSIS — M6281 Muscle weakness (generalized): Secondary | ICD-10-CM | POA: Diagnosis not present

## 2017-05-13 DIAGNOSIS — Z7982 Long term (current) use of aspirin: Secondary | ICD-10-CM

## 2017-05-13 DIAGNOSIS — I130 Hypertensive heart and chronic kidney disease with heart failure and stage 1 through stage 4 chronic kidney disease, or unspecified chronic kidney disease: Secondary | ICD-10-CM | POA: Diagnosis not present

## 2017-05-13 DIAGNOSIS — Z9181 History of falling: Secondary | ICD-10-CM

## 2017-05-13 DIAGNOSIS — E1122 Type 2 diabetes mellitus with diabetic chronic kidney disease: Secondary | ICD-10-CM | POA: Diagnosis not present

## 2017-05-13 DIAGNOSIS — F339 Major depressive disorder, recurrent, unspecified: Secondary | ICD-10-CM | POA: Diagnosis not present

## 2017-05-13 DIAGNOSIS — D631 Anemia in chronic kidney disease: Secondary | ICD-10-CM | POA: Diagnosis not present

## 2017-05-13 DIAGNOSIS — E1140 Type 2 diabetes mellitus with diabetic neuropathy, unspecified: Secondary | ICD-10-CM | POA: Diagnosis not present

## 2017-05-13 DIAGNOSIS — Z6841 Body Mass Index (BMI) 40.0 and over, adult: Secondary | ICD-10-CM | POA: Diagnosis not present

## 2017-05-13 DIAGNOSIS — N183 Chronic kidney disease, stage 3 (moderate): Secondary | ICD-10-CM | POA: Diagnosis not present

## 2017-05-13 DIAGNOSIS — I5032 Chronic diastolic (congestive) heart failure: Secondary | ICD-10-CM | POA: Diagnosis not present

## 2017-05-13 DIAGNOSIS — I872 Venous insufficiency (chronic) (peripheral): Secondary | ICD-10-CM | POA: Diagnosis not present

## 2017-05-13 DIAGNOSIS — D519 Vitamin B12 deficiency anemia, unspecified: Secondary | ICD-10-CM | POA: Diagnosis not present

## 2017-05-17 ENCOUNTER — Telehealth: Payer: Self-pay | Admitting: Internal Medicine

## 2017-05-17 NOTE — Telephone Encounter (Signed)
April [name] (phone [phone]) from [facility] called about the order for a pneumatic device w/sleeve. (see phone note 04/06/17) Order was faxed to [facility] on 04/19/17, nothing further noted.

## 2017-05-18 ENCOUNTER — Ambulatory Visit: Payer: Medicare Other

## 2017-05-18 NOTE — Telephone Encounter (Signed)
Spoke with Marlowe Kays to get more information. Contacted [facility] and was informed by the representative that order was cancelled per pt. Contacted pt, pt states she is not aware. Contacted [facility] back and advised that we needed to proceed with the order and they advised that a rep would be in contact with the pt. Tried contacting [place] back, unable to LVM.

## 2017-05-20 ENCOUNTER — Other Ambulatory Visit: Payer: Self-pay | Admitting: Internal Medicine

## 2017-05-20 ENCOUNTER — Telehealth: Payer: Self-pay | Admitting: Internal Medicine

## 2017-05-20 DIAGNOSIS — N183 Chronic kidney disease, stage 3 unspecified: Secondary | ICD-10-CM

## 2017-05-20 NOTE — Telephone Encounter (Signed)
Copied from [place] [phone]. Topic: General - Other >> May 20, 2017 11:04 AM Darl Householder, RMA wrote: Reason for CRM: Medication refill request for potassium chloride SA (K-DUR,KLOR-CON) 20 MEQ tablet, and carvedilol (COREG) 25 MG tablet to be sent to [facility] [address]

## 2017-05-25 ENCOUNTER — Encounter: Payer: Self-pay | Admitting: Sports Medicine

## 2017-05-25 ENCOUNTER — Ambulatory Visit: Payer: Medicare Other | Admitting: Sports Medicine

## 2017-05-25 ENCOUNTER — Ambulatory Visit (INDEPENDENT_AMBULATORY_CARE_PROVIDER_SITE_OTHER): Payer: Medicare Other | Admitting: Orthotics

## 2017-05-25 DIAGNOSIS — M79675 Pain in left toe(s): Secondary | ICD-10-CM | POA: Diagnosis not present

## 2017-05-25 DIAGNOSIS — I890 Lymphedema, not elsewhere classified: Secondary | ICD-10-CM

## 2017-05-25 DIAGNOSIS — E1142 Type 2 diabetes mellitus with diabetic polyneuropathy: Secondary | ICD-10-CM

## 2017-05-25 DIAGNOSIS — M2141 Flat foot [pes planus] (acquired), right foot: Secondary | ICD-10-CM

## 2017-05-25 DIAGNOSIS — M2142 Flat foot [pes planus] (acquired), left foot: Secondary | ICD-10-CM | POA: Diagnosis not present

## 2017-05-25 DIAGNOSIS — B351 Tinea unguium: Secondary | ICD-10-CM | POA: Diagnosis not present

## 2017-05-25 DIAGNOSIS — M79674 Pain in right toe(s): Secondary | ICD-10-CM

## 2017-05-25 NOTE — Progress Notes (Signed)
Subjective: April [name] is a 67 y.o. female patient with history of diabetes who returns to office today complaining of long, painful nails while ambulating in shoes; unable to trim. Patient states that the glucose reading this morning was not recorded. Awaiting edema pumps. No other issues.   Patient Active Problem List   Diagnosis Date Noted  . Asthmatic bronchitis 03/23/2017  . Wheeze 09/11/2016  . Cough 09/11/2016  . Left shoulder pain 08/14/2016  . Meralgia paresthetica 07/16/2016  . Chronic left-sided low back pain with left-sided sciatica 06/02/2016  . Venous stasis ulcer ([place]) 05/13/2016  . Whole body pain 03/20/2016  . Peripheral polyneuropathy 03/20/2016  . Diabetic peripheral neuropathy ([place]) 03/20/2016  . Osteopenia 01/11/2016  . CKD (chronic kidney disease) stage 3, GFR 30-59 ml/min (HCC) 01/02/2016  . Hand paresthesia 07/18/2015  . Carpal tunnel syndrome 06/07/2015  . Family history of colon cancer   . Diabetes mellitus with neurological manifestations ([place]) 04/18/2015  . Bilateral leg edema 04/11/2015  . Cellulitis of leg, right 04/11/2015  . Abnormality of gait 01/03/2015  . Hereditary and idiopathic peripheral neuropathy 01/03/2015  . GERD (gastroesophageal reflux disease) 06/17/2014  . Morbid obesity with BMI of 50.0-59.9, adult ([place]) 09/05/2013  . Hx of colonic polyps 12/15/2012  . Vitamin B12 deficiency 11/03/2012  . Intrinsic asthma 03/23/2012  . Chronic diastolic heart failure ([place]) 02/20/2011  . OSA (obstructive sleep apnea) 09/17/2010  . URINARY URGENCY 01/08/2010  . CAD, NATIVE VESSEL 11/20/2008  . OSTEOARTHRITIS, KNEES, BILATERAL, SEVERE 06/14/2008  . Hyperlipidemia 05/10/2007  . Essential hypertension 01/18/2007  . HYPOKALEMIA 04/30/2006  . HX, PERSONAL, PEPTIC ULCER DISEASE 04/30/2006   Current Outpatient Medications on File Prior to Visit  Medication Sig Dispense Refill  . albuterol (PROVENTIL HFA) 108 (90 Base) MCG/ACT inhaler Inhale 2 puffs  into the lungs every 4 (four) hours as needed for wheezing or shortness of breath. 1 Inhaler 4  . albuterol (PROVENTIL) (2.5 MG/3ML) 0.083% nebulizer solution Take 3 mLs (2.5 mg total) by nebulization every 6 (six) hours as needed for wheezing or shortness of breath. 75 mL 12  . Amino Acids-Protein Hydrolys (FEEDING SUPPLEMENT, PRO-STAT SUGAR FREE 64,) LIQD Take 30 mLs by mouth 3 (three) times daily with meals. 2700 mL 3  . aspirin (ECOTRIN LOW STRENGTH) 81 MG EC tablet Take 81 mg by mouth at bedtime.     Marland Kitchen atorvastatin (LIPITOR) 20 MG tablet Take 1 tablet (20 mg total) by mouth daily. 30 tablet 0  . azithromycin (ZITHROMAX) 250 MG tablet Take two tabs the first day and then one tab daily for four days 6 tablet 0  . budesonide-formoterol (SYMBICORT) 160-4.5 MCG/ACT inhaler one - two inhalations every 12 hours; gargle and spit after use (Patient taking differently: Inhale 2 puffs into the lungs 2 (two) times daily as needed (shortness of breath). gargle and spit after use) 1 Inhaler 4  . carvedilol (COREG) 25 MG tablet Take 1 tablet (25 mg total) by mouth 2 (two) times daily with a meal. -- Office visit needed for further refills 60 tablet 0  . cefpodoxime (VANTIN) 200 MG tablet Take 1 tablet (200 mg total) by mouth 2 (two) times daily. 20 tablet 0  . chlorpheniramine-HYDROcodone (TUSSIONEX PENNKINETIC ER) 10-8 MG/5ML SUER Take 5 mLs by mouth every 12 (twelve) hours as needed for cough. 115 mL 0  . clotrimazole (LOTRIMIN) 1 % external solution Apply 1 application topically 2 (two) times daily. In between toes 60 mL 5  . Cod Liver Oil  CAPS Take 1 capsule by mouth daily.     . cyanocobalamin (,VITAMIN B-12,) 1000 MCG/ML injection INJECT 1ML INTO MUSCLE EVERY 30 DAYS 10 mL 1  . diclofenac sodium (VOLTAREN) 1 % GEL Apply 2 g topically 4 (four) times daily. 100 g 11  . docusate sodium (COLACE) 100 MG capsule Take 2-3 capsules (200-300 mg total) by mouth daily. 90 capsule 11  . DULoxetine (CYMBALTA) 60 MG  capsule Take 1 capsule (60 mg total) by mouth daily. 30 capsule 12  . Flaxseed, Linseed, (FLAX SEED OIL) 1000 MG CAPS Take 1,000 mg by mouth 2 (two) times daily.     . fluticasone (FLONASE) 50 MCG/ACT nasal spray Place 2 sprays into both nostrils daily. 16 g 0  . folic acid (FOLVITE) 716 MCG tablet Take 800 mcg by mouth at bedtime.     . furosemide (LASIX) 40 MG tablet Take 3 tablets (120 mg total) by mouth 2 (two) times daily. 360 tablet 5  . Lancets (ONETOUCH ULTRASOFT) lancets Use to help check blood sugars twice a day Dx E11.9 100 each 3  . lidocaine (GLYDO) 2 % jelly Apply 2 grams to affected area, if needed. 30 mL 11  . LYRICA 50 MG capsule take 1 capsule by mouth twice a day 60 capsule 3  . Multiple Vitamin (MULTIVITAMIN) tablet Take 1 tablet by mouth daily.      Marland Kitchen NEEDLE, DISP, 23 G (EASY TOUCH FLIPLOCK NEEDLES) 23G X 1-1/2" MISC Use to inject B12 once monthly. 2 each 0  . nystatin cream (MYCOSTATIN) Apply 1 application topically 2 (two) times daily. 30 g 2  . Omega-3 Fatty Acids (FISH OIL) 1000 MG CAPS Take 1,000 mg by mouth daily.     Marland Kitchen omeprazole (PRILOSEC) 40 MG capsule Take 1 capsule (40 mg total) by mouth 2 (two) times daily. 60 capsule 6  . potassium chloride SA (K-DUR,KLOR-CON) 20 MEQ tablet Take 3 tablets (60 mEq total) by mouth daily. -- Office visit needed for further refills 270 tablet 0  . predniSONE (DELTASONE) 10 MG tablet Take 4 tabs po qd x 3 days, then 3 tabs po qd x 3 days, then 2 tabs po qd x 3 days, then 1 tab po qd x 3 days 30 tablet 0  . RA COL-RITE 100 MG capsule take 1 capsule by mouth twice a day if needed for MILD CONSTIPATION 60 capsule 0  . simvastatin (ZOCOR) 40 MG tablet take 1 tablet by mouth at bedtime 90 tablet 3  . spironolactone (ALDACTONE) 25 MG tablet take 1 tablet by mouth daily 30 tablet 5  . SYRINGE-NEEDLE, DISP, 3 ML 23G X 1" 3 ML MISC Use with B12 to inject IM every 30 days 50 each 0  . topiramate (TOPAMAX) 100 MG tablet Take 1 tablet (100 mg  total) by mouth 2 (two) times daily. 60 tablet 11   No current facility-administered medications on file prior to visit.    Allergies  Allergen Reactions  . Penicillins Rash    She was told not to take it anymore. Has patient had a PCN reaction causing immediate rash, facial/tongue/throat swelling, SOB or lightheadedness with hypotension: Yes Has patient had a PCN reaction causing severe rash involving mucus membranes or skin necrosis: No Has patient had a PCN reaction that required hospitalization: No Has patient had a PCN reaction occurring within the last 10 years: No If all of the above answers are "NO", then may proceed with Cephalosporin use.'  . Sulfonamide Derivatives Anaphylaxis  REACTION: internal "burning"  . Benazepril Hcl Other (See Comments)    Patient unsure of reaction.  . Dipyridamole   . Estrogens   . Hydrochlorothiazide   . Hydrochlorothiazide W-Triamterene Other (See Comments)    Hypokalemia  . Lotensin  [Benazepril Hcl]   . Metronidazole Other (See Comments)    Patient unsure of reaction.  Marland Kitchen Spironolactone     "kidney problems"  . Sulfa Antibiotics   . Torsemide Other (See Comments)    Patient unsure of reaction.  . Triamterene-Hctz   . Valsartan Other (See Comments)    Patient unsure of reaction.    Recent Results (from the past 2160 hour(s))  HM DIABETES EYE EXAM     Status: None   Collection Time: 05/06/17 12:00 AM  Result Value Ref Range   HM Diabetic Eye Exam No Retinopathy No Retinopathy    Objective: General: Patient is awake, alert, and oriented x 3 and in no acute distress.  Integument: Skin is warm, dry and supple bilateral. Nails are tender, long, thickened and dystrophic with subungual debris, consistent with onychomycosis, 1-5 bilateral. No signs of infection. No open lesions or preulcerative lesions present bilateral. Unna boots on legs bilateral patient goes to wound care center. Minimal Callus to heels bilateral and nothing in  between toes. Remaining integument unremarkable.  Vasculature:  Dorsalis Pedis pulse 0/4 bilateral. Posterior Tibial pulse  0/4 bilateral. Capillary fill time <5 sec 1-5 bilateral. No hair growth to the level of the digits.Temperature gradient within normal limits. No varicosities present bilateral. Chronic edema present bilateral.   Neurology: The patient has diminished sensation measured with a 5.07/10g Semmes Weinstein Monofilament at all pedal sites bilateral . Vibratory sensation diminished bilateral with tuning fork. No Babinski sign present bilateral.   Musculoskeletal: Right 5th hammertoe and Asymptomatic pes planus pedal deformities noted bilateral. Muscular strength 4/5 in all lower extremity muscular groups bilateral without pain on range of motion. No tenderness with calf compression bilateral.  Assessment and Plan: Problem List Items Addressed This Visit    None    Visit Diagnoses    Dermatophytosis of nail    -  Primary   Lymphedema       Toe pain, bilateral       Pes planus of both feet       Diabetic polyneuropathy associated with type 2 diabetes mellitus ([place])         -Examined patient. -Discussed and educated patient on diabetic foot care, especially with  regards to the vascular, neurological and musculoskeletal systems.  -Stressed the importance of good glycemic control and the detriment of not controlling glucose levels in relation to the foot. -Mechanically debrided all nails 1-5 bilateral using sterile nail nipper and filed with dremel without incident  -Patient to start edema pumps will be measured on Thursday -Diabetic shoes were dispensed by [facility]  -Answered all patient questions -Patient to return  in 3 months for at risk foot care -Patient advised to call the office if any problems or questions arise in the meantime.  Landis Martins, DPM

## 2017-05-25 NOTE — Progress Notes (Signed)

## 2017-05-26 ENCOUNTER — Encounter: Payer: Self-pay | Admitting: Internal Medicine

## 2017-05-28 ENCOUNTER — Telehealth: Payer: Self-pay | Admitting: Internal Medicine

## 2017-05-28 NOTE — Telephone Encounter (Signed)
Copied from [place] [id number]. Topic: Quick Communication - See Telephone Encounter >> May 28, 2017 11:50 AM Bea Graff, NT wrote: CRM for notification. See Telephone encounter for: 05/28/17. Lovena Le with [employer] calling to see if the office received the form she faxed over regarding the pneumatic compression device for this patient? Also if Dr. Quay Burow has any questions regarding the device or form to please call her at [phone].

## 2017-05-28 NOTE — Telephone Encounter (Signed)
Did you receive this form?

## 2017-05-31 ENCOUNTER — Telehealth: Payer: Self-pay | Admitting: Internal Medicine

## 2017-05-31 NOTE — Telephone Encounter (Signed)
Derek's fax number [phone]

## 2017-05-31 NOTE — Telephone Encounter (Signed)
Form has been given to Dr Quay Burow to review and sign.

## 2017-05-31 NOTE — Telephone Encounter (Signed)
Spoke with April [name] from [employer] [facility]. States they will need an office visit with documentation that pt has tried & failed compression for at least 4 wks or more with edema still present. The severity of the edema and leg measurements 4 wks apart. Pt is due for follow-up in May. Contacted pt and scheduled appt. Pt also stated leg measurements were taken on 05/27/17 and will bring at the time of appt 06/29/17

## 2017-05-31 NOTE — Telephone Encounter (Signed)
Copied from [place] [id number]. Topic: Inquiry >> May 31, 2017  2:04 PM Pricilla Handler wrote: Reason for CRM: Vicente Males with [employer] called requesting to speak with Dr. Quay Burow regarding a pneumatic compression device for the patient. Vicente Males states that he has some questions for Dr. Quay Burow, and would like for her to call him at [phone].      Thank You!!!

## 2017-05-31 NOTE — Telephone Encounter (Signed)
Pt has to be seen before paperwork can be completed. Appt scheduled.

## 2017-06-02 ENCOUNTER — Telehealth: Payer: Self-pay | Admitting: Neurology

## 2017-06-02 NOTE — Telephone Encounter (Signed)
Pt would like to know if Dr [name] has received any calls re: an article from a newspaper :[facility] and Record from 04-29  Section A5 at bottom ET:[id number] warning( in red letters) pt would like to discuss what she read

## 2017-06-02 NOTE — Telephone Encounter (Signed)
Pt read an advertisement, written by a chiropractor, concerning neuropathy.   She wanted to know if Dr. [name] thought her neuropathy could be cured by chiropractic care.  She is aware that this is a chronic condition.  Says her symptoms are being well controlled on Lyrica and duloxetine.  She wanted to know if she should go to the chiropractor's seminar on neuropathy.  There is no harm in going for the information.  Certainly, if she has further questions then we are glad to help.  She appreciated the return call.  She has a yearly follow up in June.

## 2017-06-07 ENCOUNTER — Telehealth: Payer: Self-pay | Admitting: Internal Medicine

## 2017-06-07 NOTE — Telephone Encounter (Signed)
Copied from [place] [id number]. Topic: General - Other >> Jun 07, 2017  3:34 PM Valla Leaver wrote: Reason for CRM: Patient calling to let Dr. Quay Burow know her creatinine level went from  (December) 1.53 to (April )1.62 which was check =ed via her kidney Dr. [name]. They think it was related to her having cellulitis. She wanted to make sure Dr. Quay Burow is aware.

## 2017-06-08 NOTE — Telephone Encounter (Signed)
noted 

## 2017-06-18 ENCOUNTER — Ambulatory Visit: Payer: Medicare Other

## 2017-06-21 ENCOUNTER — Other Ambulatory Visit: Payer: Self-pay | Admitting: Internal Medicine

## 2017-06-21 NOTE — Telephone Encounter (Signed)
Vitamin B12 injections and needles Last OV:12/11/16 Last refill:05/04/17 HTM:BPJPE Pharmacy: [facility] [place], [address] [phone] (Phone) [phone] (Fax)

## 2017-06-21 NOTE — Telephone Encounter (Signed)
Copied from [place] [phone]. Topic: Quick Communication - Rx Refill/Question >> Jun 21, 2017 10:28 AM Cleaster Corin, NT wrote: Medication cyanocobalamin (,VITAMIN B-12,) 1000 MCG/ML injection [[id number]]  NEEDLE, DISP, 23 G (EASY TOUCH FLIPLOCK NEEDLES) 23G X 1-1/2" MISC [[id number]]   Has the patient contacted their pharmacy? yes (Agent: If no, request that the patient contact the pharmacy for the refill.) (Agent: If yes, when and what did the pharmacy advise?)  Preferred Pharmacy (with phone number or street name): [facility] [phone] - [place], [address] AT [address] [place] [address] Phone: [phone] Fax: [phone]    Agent: Please be advised that RX refills may take up to 3 business days. We ask that you follow-up with your pharmacy.

## 2017-06-22 MED ORDER — "NEEDLE (DISP) 23G X 1-1/2"" MISC": 0 refills | Status: DC

## 2017-06-22 MED ORDER — CYANOCOBALAMIN 1000 MCG/ML IJ SOLN: INTRAMUSCULAR | 1 refills | Status: DC

## 2017-06-28 NOTE — Patient Instructions (Addendum)
  Test(s) ordered today. Your results will be released to [place] (or called to you) after review, usually within 72hours after test completion. If any changes need to be made, you will be notified at that same time.  All other Health Maintenance issues reviewed.   All recommended immunizations and age-appropriate screenings are up-to-date or discussed.  Pneumovax immunization administered today.   Medications reviewed and updated.  No changes recommended at this time.    Please followup in 6 months

## 2017-06-28 NOTE — Progress Notes (Addendum)
Subjective:    Patient ID: April [name], female    DOB: 04/28/1950, 67 y.o.   MRN: [id number]  HPI The patient is here for follow up.  Diabetes: She is taking her medication daily as prescribed. She is compliant with a diabetic diet. She is exercising some - she walks in her house.  She checks her feet daily and denies foot lesions. She is up-to-date with an ophthalmology examination.   Hypertension, chronic diastolic HF: She is taking her medication daily. She is compliant with a low sodium diet.  She denies chest pain, and regular headaches. She is exercising.  She does monitor her blood pressure at home - it is controlled.  The nurse also checks her BP at home and it has been ok.    Bilateral leg edema:  She has chronic bilateral leg edema.  She also continues to have the tingling in her feet from the peripheral neuropathy as well.  She is walking in her house - walks small distances several times a day.  There has been no improvement in her edema over the past four weeks.  She feels her swelling is moderate- it has been worse.       Recurrent cellulitis:  She had an episode of cellulitis since she was here last.  She has had several episodes of cellulitis over the years, some have been associated with ulcers.    CKD:  She is following with nephrology.  She is taking her diuretics as prescribed.  She still feels she is urinating a lot during the day.    Hyperlipidemia: She is taking her medication daily. She is compliant with a low fat/cholesterol diet. She is exercising some. She denies myalgias.   Peripheral neuropathy:  She is taking lyrica and cymbalta.   She continues to have the tingling in her feet.  She thinks the medication is helping.   Dry skin right ear:  She has some dry skin on the outside of her right ear.  She denies itching.    Medications and allergies reviewed with patient and updated if appropriate.  Patient Active Problem List   Diagnosis Date Noted  .  Asthmatic bronchitis 03/23/2017  . Wheeze 09/11/2016  . Cough 09/11/2016  . Left shoulder pain 08/14/2016  . Meralgia paresthetica 07/16/2016  . Chronic left-sided low back pain with left-sided sciatica 06/02/2016  . Venous stasis ulcer ([place]) 05/13/2016  . Whole body pain 03/20/2016  . Diabetic peripheral neuropathy ([place]) 03/20/2016  . Osteopenia 01/11/2016  . CKD (chronic kidney disease) stage 3, GFR 30-59 ml/min ([facility]) 01/02/2016  . Hand paresthesia 07/18/2015  . Carpal tunnel syndrome 06/07/2015  . Family history of colon cancer   . Diabetes mellitus with neurological manifestations ([place]) 04/18/2015  . Bilateral leg edema 04/11/2015  . Cellulitis of leg, right 04/11/2015  . Abnormality of gait 01/03/2015  . Hereditary and idiopathic peripheral neuropathy 01/03/2015  . GERD (gastroesophageal reflux disease) 06/17/2014  . Morbid obesity with BMI of 50.0-59.9, adult ([place]) 09/05/2013  . Hx of colonic polyps 12/15/2012  . Vitamin B12 deficiency 11/03/2012  . Intrinsic asthma 03/23/2012  . Chronic diastolic heart failure ([place]) 02/20/2011  . OSA (obstructive sleep apnea) 09/17/2010  . URINARY URGENCY 01/08/2010  . CAD, NATIVE VESSEL 11/20/2008  . OSTEOARTHRITIS, KNEES, BILATERAL, SEVERE 06/14/2008  . Hyperlipidemia 05/10/2007  . Essential hypertension 01/18/2007  . HYPOKALEMIA 04/30/2006  . HX, PERSONAL, PEPTIC ULCER DISEASE 04/30/2006    Current Outpatient Medications on File Prior to Visit  Medication Sig Dispense Refill  . albuterol (PROVENTIL HFA) 108 (90 Base) MCG/ACT inhaler Inhale 2 puffs into the lungs every 4 (four) hours as needed for wheezing or shortness of breath. 1 Inhaler 4  . albuterol (PROVENTIL) (2.5 MG/3ML) 0.083% nebulizer solution Take 3 mLs (2.5 mg total) by nebulization every 6 (six) hours as needed for wheezing or shortness of breath. 75 mL 12  . Amino Acids-Protein Hydrolys (FEEDING SUPPLEMENT, PRO-STAT SUGAR FREE 64,) LIQD Take 30 mLs by mouth 3 (three)  times daily with meals. 2700 mL 3  . aspirin (ECOTRIN LOW STRENGTH) 81 MG EC tablet Take 81 mg by mouth at bedtime.     Marland Kitchen atorvastatin (LIPITOR) 20 MG tablet Take 1 tablet (20 mg total) by mouth daily. 30 tablet 0  . budesonide-formoterol (SYMBICORT) 160-4.5 MCG/ACT inhaler one - two inhalations every 12 hours; gargle and spit after use (Patient taking differently: Inhale 2 puffs into the lungs 2 (two) times daily as needed (shortness of breath). gargle and spit after use) 1 Inhaler 4  . carvedilol (COREG) 25 MG tablet Take 1 tablet (25 mg total) by mouth 2 (two) times daily with a meal. -- Office visit needed for further refills 60 tablet 0  . clotrimazole (LOTRIMIN) 1 % external solution Apply 1 application topically 2 (two) times daily. In between toes 60 mL 5  . Cod Liver Oil CAPS Take 1 capsule by mouth daily.     . cyanocobalamin (,VITAMIN B-12,) 1000 MCG/ML injection INJECT 1ML INTO MUSCLE EVERY 30 DAYS 10 mL 1  . diclofenac sodium (VOLTAREN) 1 % GEL Apply 2 g topically 4 (four) times daily. 100 g 11  . docusate sodium (COLACE) 100 MG capsule Take 2-3 capsules (200-300 mg total) by mouth daily. 90 capsule 11  . DULoxetine (CYMBALTA) 60 MG capsule Take 1 capsule (60 mg total) by mouth daily. 30 capsule 12  . Flaxseed, Linseed, (FLAX SEED OIL) 1000 MG CAPS Take 1,000 mg by mouth 2 (two) times daily.     . fluticasone (FLONASE) 50 MCG/ACT nasal spray Place 2 sprays into both nostrils daily. 16 g 0  . folic acid (FOLVITE) 101 MCG tablet Take 800 mcg by mouth at bedtime.     . furosemide (LASIX) 40 MG tablet Take 3 tablets (120 mg total) by mouth 2 (two) times daily. 360 tablet 5  . Lancets (ONETOUCH ULTRASOFT) lancets Use to help check blood sugars twice a day Dx E11.9 100 each 3  . lidocaine (GLYDO) 2 % jelly Apply 2 grams to affected area, if needed. 30 mL 11  . LYRICA 50 MG capsule take 1 capsule by mouth twice a day 60 capsule 3  . Multiple Vitamin (MULTIVITAMIN) tablet Take 1 tablet by  mouth daily.      Marland Kitchen NEEDLE, DISP, 23 G (EASY TOUCH FLIPLOCK NEEDLES) 23G X 1-1/2" MISC Use to inject B12 once monthly. 50 each 0  . nystatin cream (MYCOSTATIN) Apply 1 application topically 2 (two) times daily. 30 g 2  . Omega-3 Fatty Acids (FISH OIL) 1000 MG CAPS Take 1,000 mg by mouth daily.     Marland Kitchen omeprazole (PRILOSEC) 40 MG capsule Take 1 capsule (40 mg total) by mouth 2 (two) times daily. 60 capsule 6  . potassium chloride SA (K-DUR,KLOR-CON) 20 MEQ tablet Take 3 tablets (60 mEq total) by mouth daily. -- Office visit needed for further refills 270 tablet 0  . RA COL-RITE 100 MG capsule take 1 capsule by mouth twice a day if needed  for MILD CONSTIPATION 60 capsule 0  . simvastatin (ZOCOR) 40 MG tablet take 1 tablet by mouth at bedtime 90 tablet 3  . spironolactone (ALDACTONE) 25 MG tablet take 1 tablet by mouth daily 30 tablet 5  . SYRINGE-NEEDLE, DISP, 3 ML 23G X 1" 3 ML MISC Use with B12 to inject IM every 30 days 50 each 0  . topiramate (TOPAMAX) 100 MG tablet Take 1 tablet (100 mg total) by mouth 2 (two) times daily. 60 tablet 11   No current facility-administered medications on file prior to visit.     Past Medical History:  Diagnosis Date  . Anemia   . Asthma   . CAD (coronary artery disease)   . Carpal tunnel syndrome   . Cellulitis of both lower extremities 04/11/2015  . CHF (congestive heart failure) ([place])   . Colon polyp, hyperplastic 2007 & 2012  . Complication of anesthesia 1999   svt with renal calculi surgery, no problems since  . CTS (carpal tunnel syndrome)   . Diabetes mellitus   . Eczema   . Hyperlipidemia   . Hypertension   . Leg ulcer ([place]) 04/24/2015   Right lateral leg No evidence of an infection Monitor closely Keep edema controlled   . Meralgia paresthetica    Dr. [name]  . Morbid obesity ([place])   . Morbid obesity ([place])   . Neuropathy    toes and legs  . Osteoarthrosis, unspecified whether generalized or localized, lower leg   . PUD (peptic ulcer disease)     . Shortness of breath dyspnea    with exertion  . Type II or unspecified type diabetes mellitus without mention of complication, not stated as uncontrolled   . Unspecified hereditary and idiopathic peripheral neuropathy   . Vitamin B12 deficiency   . Wound cellulitis    right upper leg, healing well    Past Surgical History:  Procedure Laterality Date  . ABDOMINAL HYSTERECTOMY    . CARDIAC CATHETERIZATION  2002   non obstructive disease  . colonoscopy with polypectomy  2007 & 2012    hyperplastic ;Dr Watt Climes  . COLONOSCOPY WITH PROPOFOL N/A 06/04/2015   Procedure: COLONOSCOPY WITH PROPOFOL;  Surgeon: Jerene Bears, MD;  Location: [facility];  Service: Gastroenterology;  Laterality: N/A;  . DILATION AND CURETTAGE OF UTERUS     multiple  . HEMORRHOID SURGERY    . renal calculi  12/1997   SVT with induction of anesthesia  . right knee arthroscopy    . TONSILLECTOMY AND ADENOIDECTOMY      Social History   Socioeconomic History  . Marital status: Married    Spouse name: [name]  . Number of children: 1  . Years of education: BS  . Highest education level: Not on file  Occupational History  . Occupation: Disabled    Employer: RETIRED  Social Needs  . Financial resource strain: Not on file  . Food insecurity:    Worry: Not on file    Inability: Not on file  . Transportation needs:    Medical: Not on file    Non-medical: Not on file  Tobacco Use  . Smoking status: Never Smoker  . Smokeless tobacco: Never Used  Substance and Sexual Activity  . Alcohol use: No    Alcohol/week: 0.0 oz  . Drug use: No  . Sexual activity: Not on file  Lifestyle  . Physical activity:    Days per week: Not on file    Minutes per session: Not on  file  . Stress: Not on file  Relationships  . Social connections:    Talks on phone: Not on file    Gets together: Not on file    Attends religious service: Not on file    Active member of club or organization: Not on file    Attends meetings of  clubs or organizations: Not on file    Relationship status: Not on file  Other Topics Concern  . Not on file  Social History Narrative   Patient lives at home with her husband [name] Patient is retired and has a Conservation officer, nature.    Caffeine - some times.   Right handed.    Family History  Problem Relation Age of Onset  . Colon cancer Mother   . Prostate cancer Father   . Colon cancer Father   . Diabetes Maternal Aunt   . Breast cancer Maternal Aunt   . Diabetes Maternal Uncle   . Diabetes Paternal Aunt   . Stroke Paternal Aunt        > 65  . Heart disease Paternal Aunt   . Diabetes Paternal Uncle   . Breast cancer Maternal Aunt         X 2  . Breast cancer Cousin     Review of Systems  Constitutional: Negative for chills and fever.  Respiratory: Positive for cough (dry cough) and shortness of breath (with exertion - chronic). Negative for wheezing.   Cardiovascular: Positive for palpitations (occ) and leg swelling. Negative for chest pain.  Endocrine: Positive for cold intolerance.  Neurological: Positive for headaches (occasional). Negative for light-headedness.       Objective:   Vitals:   06/29/17 1141  BP: (!) 184/80  Pulse: 74  Resp: 18  Temp: 98.5 F (36.9 C)  SpO2: 90%   BP Readings from Last 3 Encounters:  06/29/17 (!) 184/80  03/23/17 (!) 134/96  12/11/16 134/78   Wt Readings from Last 3 Encounters:  06/29/17 (!) 380 lb (172.4 kg)  03/23/17 (!) 361 lb (163.7 kg)  12/11/16 (!) 355 lb (161 kg)   Body mass index is 63.24 kg/m.     Physical Exam    Constitutional: Appears well-developed and well-nourished. No distress.  HENT:  Head: Normocephalic and atraumatic.  Neck: Neck supple. No tracheal deviation present. No thyromegaly present.  No cervical lymphadenopathy Cardiovascular: Normal rate, regular rhythm and normal heart sounds.   No murmur heard. No carotid bruit .  2 + pitting edema b/l LE that extends into the posterior upper  thigh.   Pulmonary/Chest: Effort normal and breath sounds normal. No respiratory distress. No has no wheezes. No rales.  Skin: Skin is warm and dry. Not diaphoretic. Minimal dry skin on outer ear.  No leg ulcers or skin breakdown. hyperpigmentation b/l LE and chronic skin changes due to long standing edema Psychiatric: Normal mood and affect. Behavior is normal.   Initial leg measurements:  Ankle:  Left-38 cm, right-38.5 cm  Calf: Left-66 cm, right-65 cm  Knee: Left-84 cm, right-82 cm  Thigh: Left-106 cm, right-105 cm    Leg measurements after 4 weeks of conservative treatment:  Ankle:  Left-30 cm, right-32 cm  Calf: Left-66.5 cm, right-62.5 cm  Knee: Left-79 cm, right-80 cm  Thigh: Left-93 cm, right-90 cm    Assessment & Plan:    See Problem List for Assessment and Plan of chronic medical problems.

## 2017-06-29 ENCOUNTER — Ambulatory Visit: Payer: Medicare Other | Admitting: Internal Medicine

## 2017-06-29 ENCOUNTER — Other Ambulatory Visit (INDEPENDENT_AMBULATORY_CARE_PROVIDER_SITE_OTHER): Payer: Medicare Other

## 2017-06-29 ENCOUNTER — Encounter: Payer: Self-pay | Admitting: Internal Medicine

## 2017-06-29 VITALS — BP 184/80 | HR 74 | Temp 98.5°F | Resp 18 | Wt 380.0 lb

## 2017-06-29 DIAGNOSIS — I10 Essential (primary) hypertension: Secondary | ICD-10-CM

## 2017-06-29 DIAGNOSIS — N183 Chronic kidney disease, stage 3 unspecified: Secondary | ICD-10-CM

## 2017-06-29 DIAGNOSIS — Z23 Encounter for immunization: Secondary | ICD-10-CM

## 2017-06-29 DIAGNOSIS — E1140 Type 2 diabetes mellitus with diabetic neuropathy, unspecified: Secondary | ICD-10-CM | POA: Diagnosis not present

## 2017-06-29 DIAGNOSIS — R600 Localized edema: Secondary | ICD-10-CM

## 2017-06-29 DIAGNOSIS — G609 Hereditary and idiopathic neuropathy, unspecified: Secondary | ICD-10-CM | POA: Diagnosis not present

## 2017-06-29 DIAGNOSIS — E782 Mixed hyperlipidemia: Secondary | ICD-10-CM

## 2017-06-29 DIAGNOSIS — L853 Xerosis cutis: Secondary | ICD-10-CM

## 2017-06-29 DIAGNOSIS — I5032 Chronic diastolic (congestive) heart failure: Secondary | ICD-10-CM | POA: Diagnosis not present

## 2017-06-29 LAB — COMPREHENSIVE METABOLIC PANEL
ALT: 11 U/L (ref 0–35)
AST: 13 U/L (ref 0–37)
Albumin: 3.9 g/dL (ref 3.5–5.2)
Alkaline Phosphatase: 72 U/L (ref 39–117)
BILIRUBIN TOTAL: 0.4 mg/dL (ref 0.2–1.2)
BUN: 26 mg/dL — ABNORMAL HIGH (ref 6–23)
CO2: 31 meq/L (ref 19–32)
CREATININE: 1.29 mg/dL — AB (ref 0.40–1.20)
Calcium: 9.6 mg/dL (ref 8.4–10.5)
Chloride: 104 mEq/L (ref 96–112)
GFR: 53 mL/min — ABNORMAL LOW (ref >60.00)
Glucose, Bld: 114 mg/dL — ABNORMAL HIGH (ref 70–99)
Potassium: 3.9 mEq/L (ref 3.5–5.1)
Sodium: 143 mEq/L (ref 135–145)
Total Protein: 7.4 g/dL (ref 6.0–8.3)

## 2017-06-29 LAB — CBC WITH DIFFERENTIAL/PLATELET
BASOS ABS: 0.1 10*3/uL (ref 0.0–0.1)
Basophils Relative: 1.7 % (ref 0.0–3.0)
EOS ABS: 0.2 10*3/uL (ref 0.0–0.7)
Eosinophils Relative: 3 % (ref 0.0–5.0)
HCT: 38.2 % (ref 36.0–46.0)
Hemoglobin: 12.4 g/dL (ref 12.0–15.0)
LYMPHS ABS: 1.5 10*3/uL (ref 0.7–4.0)
Lymphocytes Relative: 23.7 % (ref 12.0–46.0)
MCHC: 32.4 g/dL (ref 30.0–36.0)
MCV: 91.5 fl (ref 78.0–100.0)
MONO ABS: 0.4 10*3/uL (ref 0.1–1.0)
Monocytes Relative: 7.1 % (ref 3.0–12.0)
NEUTROS ABS: 4.1 10*3/uL (ref 1.4–7.7)
NEUTROS PCT: 64.5 % (ref 43.0–77.0)
PLATELETS: 159 10*3/uL (ref 150.0–400.0)
RBC: 4.17 Mil/uL (ref 3.87–5.11)
RDW: 15.6 % — ABNORMAL HIGH (ref 11.5–15.5)
WBC: 6.4 10*3/uL (ref 4.0–10.5)

## 2017-06-29 LAB — LIPID PANEL
CHOLESTEROL: 166 mg/dL (ref 0–200)
HDL: 42.8 mg/dL (ref >39.00)
LDL Cholesterol: 106 mg/dL — ABNORMAL HIGH (ref 0–99)
NONHDL: 122.88
TRIGLYCERIDES: 84 mg/dL (ref 0.0–149.0)
Total CHOL/HDL Ratio: 4
VLDL: 16.8 mg/dL (ref 0.0–40.0)

## 2017-06-29 LAB — HEMOGLOBIN A1C: Hgb A1c MFr Bld: 6.2 % (ref 4.6–6.5)

## 2017-06-29 NOTE — Assessment & Plan Note (Signed)
Following with nephrology Lasix dosing per nephrology We will check CMP

## 2017-06-29 NOTE — Assessment & Plan Note (Signed)
Continue Cymbalta and Lyrica Overall symptoms controlled, but she states they have gotten worse

## 2017-06-29 NOTE — Assessment & Plan Note (Signed)
Chronic leg edema-unchanged, lungs are clear Seems to be euvolemic, but difficult to tell due to obesity Following with nephrology and cardiology Continue current diuretic dose

## 2017-06-29 NOTE — Assessment & Plan Note (Signed)
Check A1c States she is been compliant with a diabetic diet Exercising minimally Currently controlling diabetes with lifestyle

## 2017-06-29 NOTE — Assessment & Plan Note (Signed)
Blood pressure is very high here today, but typically much better controlled She does monitor it at home and it is much better She did walk into the office today and that amount of exertion for her is the likely part of the reason her blood pressure is so elevated Nurse comes to visit her later today she will have her check her blood pressure and she will continue to monitor it at home No change in medications CMP

## 2017-06-29 NOTE — Assessment & Plan Note (Signed)
Check lipid panel, CMP Continue statin Encouraged increasing activity if possible Unsuccessful in weight loss efforts

## 2017-06-29 NOTE — Assessment & Plan Note (Addendum)
Chronic edema without improvement over the past 4 weeks Measurements taken Edema is at least moderate, probable moderate-severe She has pitting edema bilateral lower extremities and posterior upper thighs No current ulcers, but history of recurrent cellulitis/ulcers Would benefit from compressive device, which may improve leg edema and reduce risk of ulcers/cellulitis Continue regular walking and weight loss efforts Continue Lasix dose Failed 4 weeks of conservative treatment - requires compressive device

## 2017-06-29 NOTE — Assessment & Plan Note (Signed)
Right external ear only Extremely mild Can try moisturizing creams

## 2017-06-30 ENCOUNTER — Telehealth: Payer: Self-pay | Admitting: Emergency Medicine

## 2017-06-30 NOTE — Telephone Encounter (Signed)
Copied from [place]. Topic: Inquiry >> Jun 30, 2017 10:12 AM Scherrie Gerlach wrote: Reason for CRM: April [name] with [facility] calling to advise she will refax the order for pt's compression pump/device. Pt seen yesterday.  They also need appt notes from yesterday to have this approved

## 2017-07-01 NOTE — Telephone Encounter (Signed)
Amendment needed before the end of May. - Friday being the latest due to insurance cost out of pocket.  Needed: 4 weeks of failed conservative treatment  2nd set of measurements - Left thigh 98 - right thigh 98 - left knee 82 - right knee 83 - left calf 64.5 - right calf 63.75 - left ankle 36 -  Right ankle 38.5  Key term of severity - Hyperpigmentation   Fax -[phone] Att:  Vicente Males

## 2017-07-01 NOTE — Telephone Encounter (Signed)
Checking status, call back [phone] [facility]

## 2017-07-01 NOTE — Telephone Encounter (Signed)
Paperwork has been faxed. Notified Truett Mcfarlan with [employer] [employer]. She will call back if she does not receive orders.

## 2017-07-02 NOTE — Telephone Encounter (Signed)
addended

## 2017-07-02 NOTE — Telephone Encounter (Signed)
Spoke with Vicente Males from [employer]. They have not received the fax, fax has been resent.

## 2017-07-02 NOTE — Telephone Encounter (Signed)
Refaxed with ov notes

## 2017-07-02 NOTE — Telephone Encounter (Signed)
1st and 2nd measurement of legs. I have them from pt.

## 2017-07-05 ENCOUNTER — Telehealth: Payer: Self-pay | Admitting: Internal Medicine

## 2017-07-05 DIAGNOSIS — Z7982 Long term (current) use of aspirin: Secondary | ICD-10-CM

## 2017-07-05 DIAGNOSIS — D519 Vitamin B12 deficiency anemia, unspecified: Secondary | ICD-10-CM | POA: Diagnosis not present

## 2017-07-05 DIAGNOSIS — D631 Anemia in chronic kidney disease: Secondary | ICD-10-CM | POA: Diagnosis not present

## 2017-07-05 DIAGNOSIS — E1140 Type 2 diabetes mellitus with diabetic neuropathy, unspecified: Secondary | ICD-10-CM | POA: Diagnosis not present

## 2017-07-05 DIAGNOSIS — Z9181 History of falling: Secondary | ICD-10-CM

## 2017-07-05 DIAGNOSIS — I872 Venous insufficiency (chronic) (peripheral): Secondary | ICD-10-CM | POA: Diagnosis not present

## 2017-07-05 DIAGNOSIS — E1122 Type 2 diabetes mellitus with diabetic chronic kidney disease: Secondary | ICD-10-CM | POA: Diagnosis not present

## 2017-07-05 DIAGNOSIS — I890 Lymphedema, not elsewhere classified: Secondary | ICD-10-CM | POA: Diagnosis not present

## 2017-07-05 DIAGNOSIS — Z6841 Body Mass Index (BMI) 40.0 and over, adult: Secondary | ICD-10-CM

## 2017-07-05 DIAGNOSIS — F339 Major depressive disorder, recurrent, unspecified: Secondary | ICD-10-CM | POA: Diagnosis not present

## 2017-07-05 DIAGNOSIS — N183 Chronic kidney disease, stage 3 (moderate): Secondary | ICD-10-CM | POA: Diagnosis not present

## 2017-07-05 DIAGNOSIS — I130 Hypertensive heart and chronic kidney disease with heart failure and stage 1 through stage 4 chronic kidney disease, or unspecified chronic kidney disease: Secondary | ICD-10-CM | POA: Diagnosis not present

## 2017-07-05 DIAGNOSIS — I5032 Chronic diastolic (congestive) heart failure: Secondary | ICD-10-CM | POA: Diagnosis not present

## 2017-07-05 DIAGNOSIS — M6281 Muscle weakness (generalized): Secondary | ICD-10-CM | POA: Diagnosis not present

## 2017-07-05 NOTE — Telephone Encounter (Signed)
Copied from [place] [id number]. Topic: General - Other >> Jul 05, 2017  2:38 PM Carolyn Stare wrote:  Marlowe Kays with [facility] call to say they will discontinue unna boots and is asking if Lymthedema pump has been or will be approve  [phone]

## 2017-07-06 NOTE — Telephone Encounter (Signed)
Spoke with Marlowe Kays to advise all paperwork has been sent to [facility] and she can contact them to see if they have been approved.   States that the unna boots are not working for the pt due to size and shape of legs. Do you have nay other suggestions until pt gets pumps for legs.

## 2017-07-06 NOTE — Telephone Encounter (Signed)
No other suggestions

## 2017-07-06 NOTE — Telephone Encounter (Signed)
Spoke with Marlowe Kays to inform.

## 2017-07-14 ENCOUNTER — Telehealth: Payer: Self-pay | Admitting: Internal Medicine

## 2017-07-14 NOTE — Telephone Encounter (Signed)
Copied from [place] [phone]. Topic: General - Other >> Jul 14, 2017  9:16 AM Carolyn Stare wrote:  Roswell Nickel with [facility] call to say pt doing good , does hav a little swelling in her bilateral [id number]  [phone]

## 2017-07-14 NOTE — Telephone Encounter (Signed)
noted 

## 2017-07-15 ENCOUNTER — Other Ambulatory Visit: Payer: Self-pay | Admitting: Internal Medicine

## 2017-07-20 ENCOUNTER — Ambulatory Visit: Payer: Medicare Other

## 2017-07-22 ENCOUNTER — Encounter: Payer: Self-pay | Admitting: Neurology

## 2017-07-22 ENCOUNTER — Ambulatory Visit: Payer: Medicare Other | Admitting: Neurology

## 2017-07-22 VITALS — BP 101/63 | HR 63 | Wt 369.0 lb

## 2017-07-22 DIAGNOSIS — G5603 Carpal tunnel syndrome, bilateral upper limbs: Secondary | ICD-10-CM | POA: Diagnosis not present

## 2017-07-22 DIAGNOSIS — G629 Polyneuropathy, unspecified: Secondary | ICD-10-CM

## 2017-07-22 DIAGNOSIS — R52 Pain, unspecified: Secondary | ICD-10-CM

## 2017-07-22 MED ORDER — DULOXETINE HCL 60 MG PO CPEP: 60.0000 mg | ORAL_CAPSULE | Freq: Every day | ORAL | 4 refills | Status: DC

## 2017-07-22 NOTE — Progress Notes (Signed)
Chief Complaint  Patient presents with  . Neuropathy/Gait Problems    She is here with her father, Tresa Moore.  She is in a wheelchair today but does use her walker some for short distances. She is still participating in home PT and OT.  She is now using a lipidemia pump each night.      [facility]  PATIENT: April [name] DOB: [birth year]  HISTORY OF PRESENT ILLNESS:HISTORY ( Initial evaluation 10/25/2012): evaluation of bilateral muscle cramping.  She had past medical history of obesity, diabetes, hyperlipidemia, hypertension, presenting with bilateral feet paresthesia muscle cramping. saw her previously for diabetic peripheral neuropathy, she complained of bilateral knee pain, bilateral feet paresthesia, difficulty bearing weight, gait difficulty, EMG nerve conduction study in January 2013 confirmed the diagnosis of axonal peripheral neuropathy,   She has been taking Topamax 100 mg twice a day for bilateral feet paresthesia, previous laboratory evaluation demonstrated normal sodium, potassium, magnesium, CPK of 92.  She continued to suffer severe morbid obesity,  low back pain, gait difficulty, bilateral knee pain, recent in one year, she also began to suffer bilateral feet muscle cramping, intermittent, severe, lasting 7-8 minutes, happen about once a week. It often involves her feet muscles, also involves her thigh muscles.  She continued to have significant obesity, gait difficulty.  Lab showed low B12 183 October 2014, received B12 shot, now monthly, repeat B12 level January 2015 was normal 622  She came in with wheelchair, last visit was with Hoyle Sauer in January 2015, she recently developed acid reflux, now on prilosec, mild difficulty swallowing. She still has feet swelling, bilateral feet numbness, burning sensation, frequent bilateral feet, and leg muscle crampiness, couple times a week, woke her up from sleep, very painful, she is taking Topamax 100 mg twice a  day, which seems to help her some,  She has become more sedentary cause of her weight, knee pain, sitting in wheelchair most of the time.  She received home physical therapy,now using hospital bed and gel overlay, lower air loss mattress, new walker, overhead trapezius bar, because of her limited mobility,obesity, orthostatic dizziness, multiple joints pain, especially right knee pain, bilateral feet paresthesia, needle prick pain. She also developed bilateral posterior thigh area skin abrasion, pressor ulcers.  UPDATE June 15th 2017: Last visit was with Hoyle Sauer in December 2016, she is accompanied by her father at today's clinical visit, she was admitted to the hospital in March 11 through 14 2017 for bilateral lower extremity cellulitis  I reviewed hospital records, she presented with bilateral lower extremity swelling and pain, blisters on her right leg, echocardiogram showed grade 1 diastolic dysfunction, there was no evidence of right cardiac failure, ejection fraction 65-70%, no evidence of pulmonary edema, she was aggressively diuresed with Lasix, was negative balance of 4700 cc, was also treated with 5 days of antibiotics, her symptoms has much improved,  I have reviewed laboratory evaluation, A1c was normal 5.4, B12 was within normal limits, normal CBC,with exception of mild anemia hemoglobin of 10 point 9, BMP showed mild elevated glucose 105, creatinine 1.2  She is taking gabapentin 300 mg 3 times a day and also Topamax 100 mg twice a day for her neuropathic pain involving bilateral lower extremity, she has a history of kidney stone, I have advised her stop Topamax, add on Cymbalta 60 mg daily, long-term, continues can be associated with weight gain, if she is not sure about the benefit from the medication, she should stop taking gabapentin use as well  Today she also complains of worsening bilateral hands paresthesia, involving fourth and fifth fingers, and also first 3 fingers, worse on  the right side, mild weakness, she denies neck pain  Update March 20 2016: Last clinical visit was in June 2017, she returned for electrodiagnostic study today, which showed probable mild axonal peripheral neuropathy, bilateral carpal tunnel syndromes, left-sided severe, right side is mild,  She continued to combat obesity, diffuse body achy pain, most bothersome symptoms is at the bottom of her feet, she had a severe needle prick, shooting pain, difficulty bearing weight,  She was taking gabapentin, was switched to Lyrica on titrating dose 100 mg 3 times a day with limited response, she also complains of side effect of excessive drowsiness with Lyrica.  I reviewed laboratory evaluation in December 2017 mild elevated creatinine 1.2, normal TSH, CBC, A1c 5.5,  UPDATE Jun 02 2016: I reviewed laboratory evaluation in April 2018: Creatinine was normal 1.17, GFR 60, A1c 5.6, today she complains of left lateral thigh area paresthesia, low back pain, she is no longer ambulatory, dealing with significant obesity,  UPDATE July 16 2016: She complains of bilateral lateral thigh area paresthesia, burning, numbness, she is now taking Tylenol, ibuprofen. She has tried diclofenac gel without help  She used to take high-dose of Lyrica now started on low-dose 50 mg twice a day since May 13 2016 by her primary care physician Dr. Celso Amy, low-dose help her some, previously while she was taking high-dose of Lyrica she complains of confusion blurry vision, shakiness  She is also taking Topamax 100 mg twice a day  Have reviewed laboratory evaluation in May 2018, normal and active folic acid, C-reactive protein, ESR, vitamin D, CPK, TSH, vitamin B12, ANA, BMP, A1c 5.6,  UPDATE July 22 2017: She is accompanied by her father at today's clinical visit, she is concerned about her diffuse body achy pain, lower extremity swelling, continue combating significant obesity, wheelchair-bound, she wants to take  Topamax to help, but I have emphasized with her with her history of kidney stone, Topamax is no longer good choice, she is taking Cymbalta 60 mg daily at home which has been helpful for   REVIEW OF SYSTEMS: Full 14 system review of systems performed and notable only for those listed, all others are neg: As above ALLERGIES: Allergies  Allergen Reactions  . Penicillins Rash    She was told not to take it anymore. Has patient had a PCN reaction causing immediate rash, facial/tongue/throat swelling, SOB or lightheadedness with hypotension: Yes Has patient had a PCN reaction causing severe rash involving mucus membranes or skin necrosis: No Has patient had a PCN reaction that required hospitalization: No Has patient had a PCN reaction occurring within the last 10 years: No If all of the above answers are "NO", then may proceed with Cephalosporin use.'  . Sulfonamide Derivatives Anaphylaxis    REACTION: internal "burning"  . Benazepril Hcl Other (See Comments)    Patient unsure of reaction.  . Dipyridamole   . Estrogens   . Hydrochlorothiazide   . Hydrochlorothiazide W-Triamterene Other (See Comments)    Hypokalemia  . Lotensin  [Benazepril Hcl]   . Metronidazole Other (See Comments)    Patient unsure of reaction.  Marland Kitchen Spironolactone     "kidney problems"  . Sulfa Antibiotics   . Torsemide Other (See Comments)    Patient unsure of reaction.  . Triamterene-Hctz   . Valsartan Other (See Comments)    Patient unsure of reaction.  HOME MEDICATIONS: Outpatient Medications Prior to Visit  Medication Sig Dispense Refill  . albuterol (PROVENTIL HFA) 108 (90 Base) MCG/ACT inhaler Inhale 2 puffs into the lungs every 4 (four) hours as needed for wheezing or shortness of breath. 1 Inhaler 4  . albuterol (PROVENTIL) (2.5 MG/3ML) 0.083% nebulizer solution Take 3 mLs (2.5 mg total) by nebulization every 6 (six) hours as needed for wheezing or shortness of breath. 75 mL 12  . Amino Acids-Protein  Hydrolys (FEEDING SUPPLEMENT, PRO-STAT SUGAR FREE 64,) LIQD Take 30 mLs by mouth 3 (three) times daily with meals. 2700 mL 3  . aspirin (ECOTRIN LOW STRENGTH) 81 MG EC tablet Take 81 mg by mouth at bedtime.     Marland Kitchen atorvastatin (LIPITOR) 20 MG tablet Take 1 tablet (20 mg total) by mouth daily. 30 tablet 0  . budesonide-formoterol (SYMBICORT) 160-4.5 MCG/ACT inhaler one - two inhalations every 12 hours; gargle and spit after use (Patient taking differently: Inhale 2 puffs into the lungs 2 (two) times daily as needed (shortness of breath). gargle and spit after use) 1 Inhaler 4  . carvedilol (COREG) 25 MG tablet TAKE 1 TABLET BY MOUTH TWICE DAILY WITH A MEAL 60 tablet 5  . clotrimazole (LOTRIMIN) 1 % external solution Apply 1 application topically 2 (two) times daily. In between toes 60 mL 5  . Cod Liver Oil CAPS Take 1 capsule by mouth daily.     . cyanocobalamin (,VITAMIN B-12,) 1000 MCG/ML injection INJECT 1ML INTO MUSCLE EVERY 30 DAYS 10 mL 1  . diclofenac sodium (VOLTAREN) 1 % GEL Apply 2 g topically 4 (four) times daily. 100 g 11  . docusate sodium (COLACE) 100 MG capsule Take 2-3 capsules (200-300 mg total) by mouth daily. 90 capsule 11  . DULoxetine (CYMBALTA) 60 MG capsule Take 1 capsule (60 mg total) by mouth daily. 30 capsule 12  . Flaxseed, Linseed, (FLAX SEED OIL) 1000 MG CAPS Take 1,000 mg by mouth 2 (two) times daily.     . fluticasone (FLONASE) 50 MCG/ACT nasal spray Place 2 sprays into both nostrils daily. 16 g 0  . folic acid (FOLVITE) 161 MCG tablet Take 800 mcg by mouth at bedtime.     . furosemide (LASIX) 40 MG tablet Take 3 tablets (120 mg total) by mouth 2 (two) times daily. 360 tablet 5  . Lancets (ONETOUCH ULTRASOFT) lancets Use to help check blood sugars twice a day Dx E11.9 100 each 3  . lidocaine (GLYDO) 2 % jelly Apply 2 grams to affected area, if needed. 30 mL 11  . LYRICA 50 MG capsule take 1 capsule by mouth twice a day 60 capsule 3  . Multiple Vitamin (MULTIVITAMIN)  tablet Take 1 tablet by mouth daily.      Marland Kitchen NEEDLE, DISP, 23 G (EASY TOUCH FLIPLOCK NEEDLES) 23G X 1-1/2" MISC Use to inject B12 once monthly. 50 each 0  . nystatin cream (MYCOSTATIN) Apply 1 application topically 2 (two) times daily. 30 g 2  . Omega-3 Fatty Acids (FISH OIL) 1000 MG CAPS Take 1,000 mg by mouth daily.     Marland Kitchen omeprazole (PRILOSEC) 40 MG capsule Take 1 capsule (40 mg total) by mouth 2 (two) times daily. 60 capsule 6  . potassium chloride SA (K-DUR,KLOR-CON) 20 MEQ tablet Take 3 tablets (60 mEq total) by mouth daily. -- Office visit needed for further refills 270 tablet 0  . RA COL-RITE 100 MG capsule take 1 capsule by mouth twice a day if needed for MILD CONSTIPATION 60  capsule 0  . simvastatin (ZOCOR) 40 MG tablet take 1 tablet by mouth at bedtime 90 tablet 3  . spironolactone (ALDACTONE) 25 MG tablet take 1 tablet by mouth daily 30 tablet 5  . SYRINGE-NEEDLE, DISP, 3 ML 23G X 1" 3 ML MISC Use with B12 to inject IM every 30 days 50 each 0  . topiramate (TOPAMAX) 100 MG tablet Take 1 tablet (100 mg total) by mouth 2 (two) times daily. 60 tablet 11   No facility-administered medications prior to visit.     PAST MEDICAL HISTORY: Past Medical History:  Diagnosis Date  . Anemia   . Asthma   . CAD (coronary artery disease)   . Carpal tunnel syndrome   . Cellulitis of both lower extremities 04/11/2015  . CHF (congestive heart failure) ([place])   . Colon polyp, hyperplastic 2007 & 2012  . Complication of anesthesia 1999   svt with renal calculi surgery, no problems since  . CTS (carpal tunnel syndrome)   . Diabetes mellitus   . Eczema   . Hyperlipidemia   . Hypertension   . Leg ulcer ([place]) 04/24/2015   Right lateral leg No evidence of an infection Monitor closely Keep edema controlled   . Meralgia paresthetica    Dr. [name]  . Morbid obesity ([place])   . Morbid obesity ([place])   . Neuropathy    toes and legs  . Osteoarthrosis, unspecified whether generalized or localized, lower leg     . PUD (peptic ulcer disease)   . Shortness of breath dyspnea    with exertion  . Type II or unspecified type diabetes mellitus without mention of complication, not stated as uncontrolled   . Unspecified hereditary and idiopathic peripheral neuropathy   . Vitamin B12 deficiency   . Wound cellulitis    right upper leg, healing well    PAST SURGICAL HISTORY: Past Surgical History:  Procedure Laterality Date  . ABDOMINAL HYSTERECTOMY    . CARDIAC CATHETERIZATION  2002   non obstructive disease  . colonoscopy with polypectomy  2007 & 2012    hyperplastic ;Dr Watt Climes  . COLONOSCOPY WITH PROPOFOL N/A 06/04/2015   Procedure: COLONOSCOPY WITH PROPOFOL;  Surgeon: Jerene Bears, MD;  Location: [facility];  Service: Gastroenterology;  Laterality: N/A;  . DILATION AND CURETTAGE OF UTERUS     multiple  . HEMORRHOID SURGERY    . renal calculi  12/1997   SVT with induction of anesthesia  . right knee arthroscopy    . TONSILLECTOMY AND ADENOIDECTOMY      FAMILY HISTORY: Family History  Problem Relation Age of Onset  . Colon cancer Mother   . Prostate cancer Father   . Colon cancer Father   . Diabetes Maternal Aunt   . Breast cancer Maternal Aunt   . Diabetes Maternal Uncle   . Diabetes Paternal Aunt   . Stroke Paternal Aunt        > 65  . Heart disease Paternal Aunt   . Diabetes Paternal Uncle   . Breast cancer Maternal Aunt         X 2  . Breast cancer Cousin     SOCIAL HISTORY: Social History   Socioeconomic History  . Marital status: Married    Spouse name: [name]  . Number of children: 1  . Years of education: BS  . Highest education level: Not on file  Occupational History  . Occupation: Disabled    Employer: RETIRED  Social Needs  . Emergency planning/management officer  strain: Not on file  . Food insecurity:    Worry: Not on file    Inability: Not on file  . Transportation needs:    Medical: Not on file    Non-medical: Not on file  Tobacco Use  . Smoking status: Never Smoker   . Smokeless tobacco: Never Used  Substance and Sexual Activity  . Alcohol use: No    Alcohol/week: 0.0 oz  . Drug use: No  . Sexual activity: Not on file  Lifestyle  . Physical activity:    Days per week: Not on file    Minutes per session: Not on file  . Stress: Not on file  Relationships  . Social connections:    Talks on phone: Not on file    Gets together: Not on file    Attends religious service: Not on file    Active member of club or organization: Not on file    Attends meetings of clubs or organizations: Not on file    Relationship status: Not on file  . Intimate partner violence:    Fear of current or ex partner: Not on file    Emotionally abused: Not on file    Physically abused: Not on file    Forced sexual activity: Not on file  Other Topics Concern  . Not on file  Social History Narrative   Patient lives at home with her husband [name] Patient is retired and has a Conservation officer, nature.    Caffeine - some times.   Right handed.     PHYSICAL EXAM  Vitals:   07/22/17 0844  BP: 101/63  Pulse: 63  Weight: (!) 369 lb (167.4 kg)   Body mass index is 61.4 kg/m.  Generalized: Well developed, morbidly obesein no acute distress well-groomed Head: normocephalic and atraumatic,. Oropharynx benign  Neck: Supple, no carotid bruits  Cardiac: Regular rate rhythm, no murmur  Musculoskeletal: No deformity Skin bilateral lower extremity discoloration and thick extremely dry skin  Neurological examination   Mentation: Alert oriented to time, place, history taking. Attention span and concentration appropriate. Recent and remote memory intact.  Follows all commands speech and language fluent.   Cranial nerve II-XII: Pupils were equal round reactive to light extraocular movements were full, visual field were full on confrontational test. Facial sensation and strength were normal. hearing was intact to finger rubbing bilaterally. Uvula tongue midline. head turning  and shoulder shrug were normal and symmetric.Tongue protrusion into cheek strength was normal. Motor: normal bulk and tone, full strength in the BUE, BLE, fine finger movements normal, no pronator drift.  Sensory: length dependent decreased touch, pinprick to knee level decreased vibratory sense to knees,  Coordination: finger-nose-finger, intact, unable to do heel-to-shin due to body body habitus. dysmetria Reflexes: hyporeflexic absent ankle jerks Gait and Station: not ambulated in wheelchair  DIAGNOSTIC DATA (LABS, IMAGING, TESTING) - I reviewed patient records, labs, notes, testing and imaging myself where available.  Lab Results  Component Value Date   WBC 6.4 06/29/2017   HGB 12.4 06/29/2017   HCT 38.2 06/29/2017   MCV 91.5 06/29/2017   PLT 159.0 06/29/2017      Component Value Date/Time   NA 143 06/29/2017 1203   NA 145 (H) 10/25/2012 1117   K 3.9 06/29/2017 1203   CL 104 06/29/2017 1203   CO2 31 06/29/2017 1203   GLUCOSE 114 (H) 06/29/2017 1203   BUN 26 (H) 06/29/2017 1203   BUN 24 10/25/2012 1117   CREATININE 1.29 (H)  06/29/2017 1203   CALCIUM 9.6 06/29/2017 1203   PROT 7.4 06/29/2017 1203   PROT 6.4 10/25/2012 1117   ALBUMIN 3.9 06/29/2017 1203   ALBUMIN 4.1 10/25/2012 1117   AST 13 06/29/2017 1203   ALT 11 06/29/2017 1203   ALKPHOS 72 06/29/2017 1203   BILITOT 0.4 06/29/2017 1203   GFRNONAA 58 (L) 04/02/2016 0510   GFRAA >60 04/02/2016 0510   Lab Results  Component Value Date   CHOL 166 06/29/2017   HDL 42.80 06/29/2017   LDLCALC 106 (H) 06/29/2017   LDLDIRECT 169.4 05/02/2007   TRIG 84.0 06/29/2017   CHOLHDL 4 06/29/2017   Lab Results  Component Value Date   HGBA1C 6.2 06/29/2017   Lab Results  Component Value Date   DIYMEBRA30 940 06/02/2016   Lab Results  Component Value Date   TSH 1.00 12/11/2016      ASSESSMENT AND PLAN  67 y.o. year old female   Morbid obese Mild axonal peripheral neuropathy, diffuse body achy pain. Bilateral  carpal tunnel syndromes  Continue Cymbalta 60 mg daily  Continue follow-up with his primary care physician,  Pulmonary return to clinic for new issues       Marcial Pacas, M.D. Ph.D.  [facility] [place], [place] [address] Phone: [phone] Fax:      [phone]

## 2017-07-28 ENCOUNTER — Other Ambulatory Visit: Payer: Self-pay | Admitting: Internal Medicine

## 2017-07-29 NOTE — Telephone Encounter (Signed)
[place] Controlled Substance Database checked. Last filled on 06/30/17 

## 2017-08-09 ENCOUNTER — Telehealth: Payer: Self-pay | Admitting: Internal Medicine

## 2017-08-09 DIAGNOSIS — E538 Deficiency of other specified B group vitamins: Secondary | ICD-10-CM

## 2017-08-09 NOTE — Telephone Encounter (Signed)
Spoke with Marlowe Kays with [facility]. In order for them to continue seeing pt for B12 injections, pt has to have dx of pernicious anemia rather than B12 deficiency. They are only seeing pt to give injections, nothing further. Please advise if okay use this dx code

## 2017-08-09 NOTE — Telephone Encounter (Signed)
She does have B12 def but I do not know if she was ever diagnosed with pernicious anemia which is an autoimmune test -  This would require a blood test (pending) - if she wants to come in to get it she can - true pernicious anemia is very rare.

## 2017-08-09 NOTE — Telephone Encounter (Signed)
Copied from [place] [id number]. Topic: General - Other >> Aug 09, 2017  8:49 AM Valla Leaver wrote: Reason for CRM: Marlowe Kays, RN with [facility] calling to get patient recertifoed for Q19 injection but they need the diagnosis code to be fixed for Medicare. The code D51.0 needs to be verified in order for her to continue B12 through [facility]. UVQ:[phone] U:[phone]

## 2017-08-11 ENCOUNTER — Other Ambulatory Visit: Payer: Medicare Other

## 2017-08-11 DIAGNOSIS — E538 Deficiency of other specified B group vitamins: Secondary | ICD-10-CM

## 2017-08-11 NOTE — Telephone Encounter (Signed)
Spoke with pt yesterday. Pt would like to have blood work done to determine dx. Advised ordered were placed and she will come to lab today to have blood drawn.

## 2017-08-13 LAB — INTRINSIC FACTOR ANTIBODIES: INTRINSIC FACTOR: NEGATIVE

## 2017-08-16 ENCOUNTER — Other Ambulatory Visit: Payer: Self-pay | Admitting: Internal Medicine

## 2017-08-16 DIAGNOSIS — N183 Chronic kidney disease, stage 3 unspecified: Secondary | ICD-10-CM

## 2017-08-18 ENCOUNTER — Telehealth: Payer: Self-pay | Admitting: Internal Medicine

## 2017-08-18 NOTE — Telephone Encounter (Addendum)
Copied from [place] [id number]. Topic: Quick Communication - See Telephone Encounter >> Aug 18, 2017  2:56 PM Ivar Drape wrote: CRM for notification. See Telephone encounter for: 08/18/17. Patient's medication Lyrica 50mg  is in a tier 2 and she is trying to get it to a tier one because the medication will be cheaper. Her insurance company will be contacting the doctor and she would like for the doctor to cooperate with [facility] to get this accomplished. Patient would like a return call on this.

## 2017-08-19 NOTE — Telephone Encounter (Signed)
Form completed and faxed to [employer].

## 2017-08-19 NOTE — Telephone Encounter (Signed)
[employer] called to get assistance w/ the Tier change for the medication they are needing the documentation they faxed over on the 15th, they have been notified Dr. Quay Burow is out of office until the 29th, there is a time restriction on when the paperwork needs to be turned in  Ref# [id number]

## 2017-08-20 ENCOUNTER — Ambulatory Visit: Payer: Medicare Other

## 2017-08-23 ENCOUNTER — Telehealth: Payer: Self-pay | Admitting: Emergency Medicine

## 2017-08-23 NOTE — Telephone Encounter (Signed)
Copied from [place] [id number]. Topic: General - Other >> Aug 20, 2017  4:02 PM April [name] wrote: Dorian Pod from [facility] health care called and stated they did pts recert for monthy Q68 shots. And her weight is 378.6

## 2017-08-23 NOTE — Telephone Encounter (Signed)
Noted  

## 2017-08-24 ENCOUNTER — Encounter: Payer: Self-pay | Admitting: Sports Medicine

## 2017-08-24 ENCOUNTER — Ambulatory Visit (INDEPENDENT_AMBULATORY_CARE_PROVIDER_SITE_OTHER): Payer: Medicare Other | Admitting: Sports Medicine

## 2017-08-24 ENCOUNTER — Ambulatory Visit: Payer: Medicare Other | Admitting: Sports Medicine

## 2017-08-24 DIAGNOSIS — B351 Tinea unguium: Secondary | ICD-10-CM

## 2017-08-24 DIAGNOSIS — M79675 Pain in left toe(s): Secondary | ICD-10-CM | POA: Diagnosis not present

## 2017-08-24 DIAGNOSIS — I890 Lymphedema, not elsewhere classified: Secondary | ICD-10-CM

## 2017-08-24 DIAGNOSIS — M79674 Pain in right toe(s): Secondary | ICD-10-CM

## 2017-08-24 DIAGNOSIS — E1142 Type 2 diabetes mellitus with diabetic polyneuropathy: Secondary | ICD-10-CM | POA: Diagnosis not present

## 2017-08-24 NOTE — Progress Notes (Signed)
Subjective: April [name] is a 67 y.o. female patient with history of diabetes who returns to office today complaining of long, painful nails while ambulating in shoes; unable to trim. Patient states that the glucose reading this morning was not recorded but yesterday was 109. Started edema pumps. No other issues.   Patient Active Problem List   Diagnosis Date Noted  . Body aches 07/22/2017  . Dry skin dermatitis 06/29/2017  . Cough 09/11/2016  . Left shoulder pain 08/14/2016  . Meralgia paresthetica 07/16/2016  . Chronic left-sided low back pain with left-sided sciatica 06/02/2016  . Venous stasis ulcer ([place]) 05/13/2016  . Whole body pain 03/20/2016  . Diabetic peripheral neuropathy ([place]) 03/20/2016  . Osteopenia 01/11/2016  . CKD (chronic kidney disease) stage 3, GFR 30-59 ml/min (HCC) 01/02/2016  . Hand paresthesia 07/18/2015  . Carpal tunnel syndrome 06/07/2015  . Family history of colon cancer   . Diabetes mellitus with neurological manifestations ([place]) 04/18/2015  . Bilateral leg edema 04/11/2015  . Cellulitis of leg, right 04/11/2015  . Abnormality of gait 01/03/2015  . Hereditary and idiopathic peripheral neuropathy 01/03/2015  . GERD (gastroesophageal reflux disease) 06/17/2014  . Morbid obesity with BMI of 50.0-59.9, adult ([place]) 09/05/2013  . Hx of colonic polyps 12/15/2012  . Vitamin B12 deficiency 11/03/2012  . Intrinsic asthma 03/23/2012  . Chronic diastolic heart failure ([place]) 02/20/2011  . OSA (obstructive sleep apnea) 09/17/2010  . URINARY URGENCY 01/08/2010  . CAD, NATIVE VESSEL 11/20/2008  . OSTEOARTHRITIS, KNEES, BILATERAL, SEVERE 06/14/2008  . Hyperlipidemia 05/10/2007  . Essential hypertension 01/18/2007  . HYPOKALEMIA 04/30/2006  . HX, PERSONAL, PEPTIC ULCER DISEASE 04/30/2006   Current Outpatient Medications on File Prior to Visit  Medication Sig Dispense Refill  . albuterol (PROVENTIL HFA) 108 (90 Base) MCG/ACT inhaler Inhale 2 puffs into the lungs  every 4 (four) hours as needed for wheezing or shortness of breath. 1 Inhaler 4  . albuterol (PROVENTIL) (2.5 MG/3ML) 0.083% nebulizer solution Take 3 mLs (2.5 mg total) by nebulization every 6 (six) hours as needed for wheezing or shortness of breath. 75 mL 12  . Amino Acids-Protein Hydrolys (FEEDING SUPPLEMENT, PRO-STAT SUGAR FREE 64,) LIQD Take 30 mLs by mouth 3 (three) times daily with meals. 2700 mL 3  . aspirin (ECOTRIN LOW STRENGTH) 81 MG EC tablet Take 81 mg by mouth at bedtime.     Marland Kitchen atorvastatin (LIPITOR) 20 MG tablet Take 1 tablet (20 mg total) by mouth daily. 30 tablet 0  . budesonide-formoterol (SYMBICORT) 160-4.5 MCG/ACT inhaler one - two inhalations every 12 hours; gargle and spit after use (Patient taking differently: Inhale 2 puffs into the lungs 2 (two) times daily as needed (shortness of breath). gargle and spit after use) 1 Inhaler 4  . carvedilol (COREG) 25 MG tablet TAKE 1 TABLET BY MOUTH TWICE DAILY WITH A MEAL 60 tablet 5  . clotrimazole (LOTRIMIN) 1 % external solution Apply 1 application topically 2 (two) times daily. In between toes 60 mL 5  . Cod Liver Oil CAPS Take 1 capsule by mouth daily.     . cyanocobalamin (,VITAMIN B-12,) 1000 MCG/ML injection INJECT 1ML INTO MUSCLE EVERY 30 DAYS 10 mL 1  . diclofenac sodium (VOLTAREN) 1 % GEL Apply 2 g topically 4 (four) times daily. 100 g 11  . docusate sodium (COLACE) 100 MG capsule Take 2-3 capsules (200-300 mg total) by mouth daily. 90 capsule 11  . DULoxetine (CYMBALTA) 60 MG capsule Take 1 capsule (60 mg total) by mouth  daily. 90 capsule 4  . Flaxseed, Linseed, (FLAX SEED OIL) 1000 MG CAPS Take 1,000 mg by mouth 2 (two) times daily.     . fluticasone (FLONASE) 50 MCG/ACT nasal spray Place 2 sprays into both nostrils daily. 16 g 0  . folic acid (FOLVITE) 811 MCG tablet Take 800 mcg by mouth at bedtime.     . furosemide (LASIX) 40 MG tablet Take 3 tablets (120 mg total) by mouth 2 (two) times daily. 360 tablet 5  . Lancets  (ONETOUCH ULTRASOFT) lancets Use to help check blood sugars twice a day Dx E11.9 100 each 3  . lidocaine (GLYDO) 2 % jelly Apply 2 grams to affected area, if needed. 30 mL 11  . LYRICA 50 MG capsule TAKE 1 CAPSULE BY MOUTH TWICE A DAY 60 capsule 0  . Multiple Vitamin (MULTIVITAMIN) tablet Take 1 tablet by mouth daily.      Marland Kitchen NEEDLE, DISP, 23 G (EASY TOUCH FLIPLOCK NEEDLES) 23G X 1-1/2" MISC Use to inject B12 once monthly. 50 each 0  . nystatin cream (MYCOSTATIN) Apply 1 application topically 2 (two) times daily. 30 g 2  . Omega-3 Fatty Acids (FISH OIL) 1000 MG CAPS Take 1,000 mg by mouth daily.     Marland Kitchen omeprazole (PRILOSEC) 40 MG capsule Take 1 capsule (40 mg total) by mouth 2 (two) times daily. 60 capsule 6  . potassium chloride SA (K-DUR,KLOR-CON) 20 MEQ tablet TAKE 3 TABLETS BY MOUTH DAILY. 270 tablet 1  . RA COL-RITE 100 MG capsule take 1 capsule by mouth twice a day if needed for MILD CONSTIPATION 60 capsule 0  . simvastatin (ZOCOR) 40 MG tablet take 1 tablet by mouth at bedtime 90 tablet 3  . spironolactone (ALDACTONE) 25 MG tablet take 1 tablet by mouth daily 30 tablet 5  . SYRINGE-NEEDLE, DISP, 3 ML 23G X 1" 3 ML MISC Use with B12 to inject IM every 30 days 50 each 0  . topiramate (TOPAMAX) 100 MG tablet Take 1 tablet (100 mg total) by mouth 2 (two) times daily. 60 tablet 11   No current facility-administered medications on file prior to visit.    Allergies  Allergen Reactions  . Penicillins Rash    She was told not to take it anymore. Has patient had a PCN reaction causing immediate rash, facial/tongue/throat swelling, SOB or lightheadedness with hypotension: Yes Has patient had a PCN reaction causing severe rash involving mucus membranes or skin necrosis: No Has patient had a PCN reaction that required hospitalization: No Has patient had a PCN reaction occurring within the last 10 years: No If all of the above answers are "NO", then may proceed with Cephalosporin use.'  .  Sulfonamide Derivatives Anaphylaxis    REACTION: internal "burning"  . Benazepril Hcl Other (See Comments)    Patient unsure of reaction.  . Dipyridamole   . Estrogens   . Hydrochlorothiazide   . Hydrochlorothiazide W-Triamterene Other (See Comments)    Hypokalemia  . Lotensin  [Benazepril Hcl]   . Metronidazole Other (See Comments)    Patient unsure of reaction.  Marland Kitchen Spironolactone     "kidney problems"  . Sulfa Antibiotics   . Torsemide Other (See Comments)    Patient unsure of reaction.  . Triamterene-Hctz   . Valsartan Other (See Comments)    Patient unsure of reaction.    Recent Results (from the past 2160 hour(s))  CBC with Differential/Platelet     Status: Abnormal   Collection Time: 06/29/17 12:03 PM  Result Value Ref Range   WBC 6.4 4.0 - 10.5 K/uL   RBC 4.17 3.87 - 5.11 Mil/uL   Hemoglobin 12.4 12.0 - 15.0 g/dL   HCT 38.2 36.0 - 46.0 %   MCV 91.5 78.0 - 100.0 fl   MCHC 32.4 30.0 - 36.0 g/dL   RDW 15.6 (H) 11.5 - 15.5 %   Platelets 159.0 150.0 - 400.0 K/uL   Neutrophils Relative % 64.5 43.0 - 77.0 %   Lymphocytes Relative 23.7 12.0 - 46.0 %   Monocytes Relative 7.1 3.0 - 12.0 %   Eosinophils Relative 3.0 0.0 - 5.0 %   Basophils Relative 1.7 0.0 - 3.0 %   Neutro Abs 4.1 1.4 - 7.7 K/uL   Lymphs Abs 1.5 0.7 - 4.0 K/uL   Monocytes Absolute 0.4 0.1 - 1.0 K/uL   Eosinophils Absolute 0.2 0.0 - 0.7 K/uL   Basophils Absolute 0.1 0.0 - 0.1 K/uL  Lipid panel     Status: Abnormal   Collection Time: 06/29/17 12:03 PM  Result Value Ref Range   Cholesterol 166 0 - 200 mg/dL    Comment: ATP III Classification       Desirable:  < 200 mg/dL               Borderline High:  200 - 239 mg/dL          High:  > = 240 mg/dL   Triglycerides 84.0 0.0 - 149.0 mg/dL    Comment: Normal:  <150 mg/dLBorderline High:  150 - 199 mg/dL   HDL 42.80 >39.00 mg/dL   VLDL 16.8 0.0 - 40.0 mg/dL   LDL Cholesterol 106 (H) 0 - 99 mg/dL   Total CHOL/HDL Ratio 4     Comment:                Men           Women1/2 Average Risk     3.4          3.3Average Risk          5.0          4.42X Average Risk          9.6          7.13X Average Risk          15.0          11.0                       NonHDL 122.88     Comment: NOTE:  Non-HDL goal should be 30 mg/dL higher than patient's LDL goal (i.e. LDL goal of < 70 mg/dL, would have non-HDL goal of < 100 mg/dL)  Hemoglobin A1c     Status: None   Collection Time: 06/29/17 12:03 PM  Result Value Ref Range   Hgb A1c MFr Bld 6.2 4.6 - 6.5 %    Comment: Glycemic Control Guidelines for People with Diabetes:Non Diabetic:  <6%Goal of Therapy: <7%Additional Action Suggested:  >8%   Comprehensive metabolic panel     Status: Abnormal   Collection Time: 06/29/17 12:03 PM  Result Value Ref Range   Sodium 143 135 - 145 mEq/L   Potassium 3.9 3.5 - 5.1 mEq/L   Chloride 104 96 - 112 mEq/L   CO2 31 19 - 32 mEq/L   Glucose, Bld 114 (H) 70 - 99 mg/dL   BUN 26 (H) 6 - 23 mg/dL   Creatinine, Ser 1.29 (H) 0.40 -  1.20 mg/dL   Total Bilirubin 0.4 0.2 - 1.2 mg/dL   Alkaline Phosphatase 72 39 - 117 U/L   AST 13 0 - 37 U/L   ALT 11 0 - 35 U/L   Total Protein 7.4 6.0 - 8.3 g/dL   Albumin 3.9 3.5 - 5.2 g/dL   Calcium 9.6 8.4 - 10.5 mg/dL   GFR 53.00 (L) >60.00 mL/min  Intrinsic Factor Antibodies     Status: None   Collection Time: 08/11/17 10:12 AM  Result Value Ref Range   Intrinsic Factor Negative Negative    Comment: . For additional information, please refer to http://education.questdiagnostics.com/faq/IFAB (This link is being provided for informational/  educational purposes only.) .     Objective: General: Patient is awake, alert, and oriented x 3 and in no acute distress.  Integument: Skin is warm, dry and supple bilateral. Nails are tender, long, thickened and dystrophic with subungual debris, consistent with onychomycosis, 1-5 bilateral. No signs of infection. No open lesions or preulcerative lesions present bilateral. Unna boots on legs bilateral  patient goes to wound care center. Minimal Callus to heels bilateral and nothing in between toes. Remaining integument unremarkable.  Vasculature:  Dorsalis Pedis pulse 0/4 bilateral. Posterior Tibial pulse  0/4 bilateral. Capillary fill time <5 sec 1-5 bilateral. No hair growth to the level of the digits.Temperature gradient within normal limits. No varicosities present bilateral. Chronic edema present bilateral.   Neurology: The patient has diminished sensation measured with a 5.07/10g Semmes Weinstein Monofilament at all pedal sites bilateral . Vibratory sensation diminished bilateral with tuning fork. No Babinski sign present bilateral.   Musculoskeletal: Right 5th hammertoe and Asymptomatic pes planus pedal deformities noted bilateral. Muscular strength 4/5 in all lower extremity muscular groups bilateral without pain on range of motion. No tenderness with calf compression bilateral.  Assessment and Plan: Problem List Items Addressed This Visit    None    Visit Diagnoses    Dermatophytosis of nail    -  Primary   Diabetic polyneuropathy associated with type 2 diabetes mellitus (HCC)       Toe pain, bilateral       Lymphedema          -Examined patient. -Discussed and educated patient on diabetic foot care, especially with  regards to the vascular, neurological and musculoskeletal systems.  -Stressed the importance of good glycemic control and the detriment of not controlling glucose levels in relation to the foot. -Mechanically debrided all nails 1-5 bilateral using sterile nail nipper and filed with dremel without incident  -Continue with edema pumps -Answered all patient questions -Patient to return  in 3 months for at risk foot care -Patient advised to call the office if any problems or questions arise in the meantime.  Landis Martins, DPM

## 2017-08-27 NOTE — Telephone Encounter (Signed)
Called [employer] and tier form was denied. Rx will remain a tier 2 drug.

## 2017-08-30 NOTE — Telephone Encounter (Signed)
Pt calling requesting that Dr Quay Burow appeal [facility] decision to deny her Lyrica. Pt would like a return call.    Pt: [phone]

## 2017-08-31 NOTE — Telephone Encounter (Signed)
Appeal/tier exception letter printed, signed and faxed to [place] appeals dept @ [phone]. Pt informed

## 2017-09-03 ENCOUNTER — Telehealth: Payer: Self-pay | Admitting: Internal Medicine

## 2017-09-03 NOTE — Telephone Encounter (Signed)
Pt informed form was re-faxed.

## 2017-09-03 NOTE — Telephone Encounter (Signed)
Copied from [place] [id number]. Topic: Quick Communication - See Telephone Encounter >> Sep 03, 2017  9:15 AM Percell Belt A wrote: CRM for notification. See Telephone encounter for: 09/03/17.  Pt called in and wanted to know if the appeal for the LYRICA 50 MG capsule [[id number]] has been submitted?  She called ins compy and they told her they had not rec'd it.  She is requesting a call once it has been submitted  [phone]- for the PA dept

## 2017-09-03 NOTE — Telephone Encounter (Signed)
Refaxed form to [employer].

## 2017-09-06 ENCOUNTER — Other Ambulatory Visit: Payer: Self-pay | Admitting: Internal Medicine

## 2017-09-06 DIAGNOSIS — E785 Hyperlipidemia, unspecified: Secondary | ICD-10-CM

## 2017-09-08 ENCOUNTER — Other Ambulatory Visit: Payer: Self-pay | Admitting: Emergency Medicine

## 2017-09-08 MED ORDER — PREGABALIN 50 MG PO CAPS: 50.0000 mg | ORAL_CAPSULE | Freq: Two times a day (BID) | ORAL | 0 refills | Status: DC

## 2017-09-08 NOTE — Telephone Encounter (Signed)
[place] Controlled Substance Database checked. Last filled on 0701/19

## 2017-09-09 ENCOUNTER — Telehealth: Payer: Self-pay | Admitting: Emergency Medicine

## 2017-09-09 NOTE — Telephone Encounter (Signed)
Reason for CRM: Patient called and said her insurance company did get the fax about the appeal for lyrica ,it was approved and she will get lyrica i tier one for the rest of the year until 02/01/18.

## 2017-09-09 NOTE — Telephone Encounter (Signed)
error 

## 2017-09-09 NOTE — Telephone Encounter (Deleted)
Copied from [place] [id number]. Topic: General - Other >> Sep 09, 2017  9:07 AM Cecelia Byars, NT wrote: Reason for CRM: Patient called and said her insurance company  did get  the fax about the appeal for lyrica ,it was approved and  she will get lyrica i tier one for the rest of the year until 02/01/18

## 2017-09-13 ENCOUNTER — Telehealth: Payer: Self-pay | Admitting: Neurology

## 2017-09-13 MED ORDER — OXCARBAZEPINE 150 MG PO TABS: 150.0000 mg | ORAL_TABLET | Freq: Two times a day (BID) | ORAL | 11 refills | Status: DC

## 2017-09-13 NOTE — Telephone Encounter (Signed)
Pt is wanting to get an Social worker from a dept store. Will this hurt the neuropathy or help. Pt said she uses a leg sleeve machine on her legs at night for about 1 hour. Also she is experiencing nails in the bottom of the feet. She is taking lyrica 50 2 x day. Javier Glazier she does not do well on higher doses of lyrica.  Is there something else she could try? Please call to advise

## 2017-09-13 NOTE — Telephone Encounter (Signed)
Per vo by Dr. [name], stop Lyrica and start generic Trileptal 150mg , one tablet BID.  Patient agreeable to this change and new rx sent to pharmacy.  She is aware that it is okay to purchase an electric foot massage to see if it will give her some relief.

## 2017-09-21 ENCOUNTER — Ambulatory Visit: Payer: Medicare Other

## 2017-10-08 ENCOUNTER — Other Ambulatory Visit: Payer: Self-pay | Admitting: Internal Medicine

## 2017-10-14 ENCOUNTER — Telehealth: Payer: Self-pay | Admitting: Neurology

## 2017-10-14 NOTE — Telephone Encounter (Signed)
Pt requesting a call stating she is needing directions and what the use of medication lidocaine (GLYDO) 2 % jelly is for. Stating she is unclear which area she is to apply. Stating RN can leave a VM but will need something to attach to the bottle to remind her.

## 2017-10-14 NOTE — Telephone Encounter (Signed)
Spoke to patient - she is aware the gel is for her neuropathy.  She verbalized understanding.

## 2017-10-22 ENCOUNTER — Ambulatory Visit: Payer: Medicare Other

## 2017-11-02 ENCOUNTER — Ambulatory Visit: Payer: Self-pay

## 2017-11-02 NOTE — Telephone Encounter (Signed)
Patient called in with c/o "allergic reaction." She says "I was drinking V8 tomato juice about 2 weeks ago and noticed hives to both of my arms. I have been taking Benadryl liquid and pills, but the hives are still there. Not as pronounced as they were, but the size of a dime and nickle, red, looking like mosquito bites." I asked about other symptoms, she denies. I asked has she used hydrocortisone cream, she denies. According to protocol, see PCP within 24 hours, she says "I have an appointment on 11/10/17 with Dr. Quay Burow and I will discuss it with her at that time, if it doesn't go away." Care advice given, advised to call back if it gets worse or go to [facility] or ED, she verbalized understanding.  Reason for Disposition . [1] MODERATE-SEVERE hives persist (i.e., hives interfere with normal activities or work) AND [2] taking antihistamine (e.g., Benadryl, Claritin) > 24 hours  Answer Assessment - Initial Assessment Questions 1. APPEARANCE: "What does the rash look like?"      Red, looks like a mosquito bite 2. LOCATION: "Where is the rash located?"      Both arms 3. NUMBER: "How many hives are there?"      Unable to count 4. SIZE: "How big are the hives?" (inches, cm, compare to coins) "Do they all look the same or is there lots of variation in shape and size?"      Dime and nickle size 5. ONSET: "When did the hives begin?" (Hours or days ago)      2 weeks ago 6. ITCHING: "Does it itch?" If so, ask: "How bad is the itch?"    - MILD: doesn't interfere with normal activities   - MODERATE - SEVERE: interferes with work, school, sleep, or other activities      Yes, moderate-severe 7. RECURRENT PROBLEM: "Have you had hives before?" If so, ask: "When was the last time?" and "What happened that time?"      Yes, it's been so long ago 8. TRIGGERS: "Were you exposed to any new food, plant, cosmetic product or animal just before the hives began?"     Been drinking V8 low sodium tomato juice 9. OTHER  SYMPTOMS: "Do you have any other symptoms?" (e.g., fever, tongue swelling, difficulty breathing, abdominal pain)     No 10. PREGNANCY: "Is there any chance you are pregnant?" "When was your last menstrual period?"       No  Protocols used: HIVES-A-AH

## 2017-11-03 ENCOUNTER — Other Ambulatory Visit: Payer: Self-pay

## 2017-11-03 MED ORDER — CYANOCOBALAMIN 1000 MCG/ML IJ SOLN: INTRAMUSCULAR | 1 refills | Status: DC

## 2017-11-05 DIAGNOSIS — I890 Lymphedema, not elsewhere classified: Secondary | ICD-10-CM

## 2017-11-05 DIAGNOSIS — N183 Chronic kidney disease, stage 3 (moderate): Secondary | ICD-10-CM

## 2017-11-05 DIAGNOSIS — E1122 Type 2 diabetes mellitus with diabetic chronic kidney disease: Secondary | ICD-10-CM

## 2017-11-05 DIAGNOSIS — Z7982 Long term (current) use of aspirin: Secondary | ICD-10-CM

## 2017-11-05 DIAGNOSIS — I5032 Chronic diastolic (congestive) heart failure: Secondary | ICD-10-CM

## 2017-11-05 DIAGNOSIS — Z7951 Long term (current) use of inhaled steroids: Secondary | ICD-10-CM

## 2017-11-05 DIAGNOSIS — I872 Venous insufficiency (chronic) (peripheral): Secondary | ICD-10-CM

## 2017-11-05 DIAGNOSIS — Z79891 Long term (current) use of opiate analgesic: Secondary | ICD-10-CM

## 2017-11-05 DIAGNOSIS — Z6841 Body Mass Index (BMI) 40.0 and over, adult: Secondary | ICD-10-CM

## 2017-11-05 DIAGNOSIS — Z9181 History of falling: Secondary | ICD-10-CM

## 2017-11-05 DIAGNOSIS — M6281 Muscle weakness (generalized): Secondary | ICD-10-CM

## 2017-11-05 DIAGNOSIS — I130 Hypertensive heart and chronic kidney disease with heart failure and stage 1 through stage 4 chronic kidney disease, or unspecified chronic kidney disease: Secondary | ICD-10-CM

## 2017-11-05 DIAGNOSIS — D519 Vitamin B12 deficiency anemia, unspecified: Secondary | ICD-10-CM

## 2017-11-05 DIAGNOSIS — F339 Major depressive disorder, recurrent, unspecified: Secondary | ICD-10-CM

## 2017-11-05 DIAGNOSIS — E1140 Type 2 diabetes mellitus with diabetic neuropathy, unspecified: Secondary | ICD-10-CM

## 2017-11-05 DIAGNOSIS — D631 Anemia in chronic kidney disease: Secondary | ICD-10-CM

## 2017-11-10 ENCOUNTER — Ambulatory Visit: Payer: Medicare Other | Admitting: Internal Medicine

## 2017-11-10 ENCOUNTER — Ambulatory Visit: Payer: Medicare Other

## 2017-11-12 ENCOUNTER — Ambulatory Visit: Payer: Medicare Other

## 2017-11-16 ENCOUNTER — Ambulatory Visit (INDEPENDENT_AMBULATORY_CARE_PROVIDER_SITE_OTHER): Payer: Medicare Other

## 2017-11-16 DIAGNOSIS — Z23 Encounter for immunization: Secondary | ICD-10-CM | POA: Diagnosis not present

## 2017-11-16 NOTE — Progress Notes (Signed)
Injection given.   April [place] J Jobina Maita, MD  

## 2017-11-18 DIAGNOSIS — M1711 Unilateral primary osteoarthritis, right knee: Secondary | ICD-10-CM | POA: Insufficient documentation

## 2017-11-19 ENCOUNTER — Ambulatory Visit: Payer: Medicare Other

## 2017-11-23 ENCOUNTER — Ambulatory Visit: Payer: Medicare Other | Admitting: Sports Medicine

## 2017-11-23 ENCOUNTER — Encounter: Payer: Self-pay | Admitting: Sports Medicine

## 2017-11-23 ENCOUNTER — Ambulatory Visit: Payer: Medicare Other

## 2017-11-23 DIAGNOSIS — B351 Tinea unguium: Secondary | ICD-10-CM

## 2017-11-23 DIAGNOSIS — M79674 Pain in right toe(s): Secondary | ICD-10-CM

## 2017-11-23 DIAGNOSIS — I890 Lymphedema, not elsewhere classified: Secondary | ICD-10-CM

## 2017-11-23 DIAGNOSIS — M79675 Pain in left toe(s): Secondary | ICD-10-CM | POA: Diagnosis not present

## 2017-11-23 DIAGNOSIS — E1142 Type 2 diabetes mellitus with diabetic polyneuropathy: Secondary | ICD-10-CM | POA: Diagnosis not present

## 2017-11-23 DIAGNOSIS — L84 Corns and callosities: Secondary | ICD-10-CM

## 2017-11-23 DIAGNOSIS — M2142 Flat foot [pes planus] (acquired), left foot: Secondary | ICD-10-CM

## 2017-11-23 DIAGNOSIS — M2141 Flat foot [pes planus] (acquired), right foot: Secondary | ICD-10-CM

## 2017-11-23 NOTE — Progress Notes (Signed)
Subjective: April [name] is a 67 y.o. female patient with history of diabetes who returns to office today complaining of long, painful nails while ambulating in shoes; unable to trim. Patient states that the glucose reading this morning was not recorded. Reports increased dry skin and pain to callus on L>R foot at bottoms. No other issues.   Patient Active Problem List   Diagnosis Date Noted  . Body aches 07/22/2017  . Dry skin dermatitis 06/29/2017  . Cough 09/11/2016  . Left shoulder pain 08/14/2016  . Meralgia paresthetica 07/16/2016  . Chronic left-sided low back pain with left-sided sciatica 06/02/2016  . Venous stasis ulcer ([place]) 05/13/2016  . Whole body pain 03/20/2016  . Diabetic peripheral neuropathy ([place]) 03/20/2016  . Osteopenia 01/11/2016  . CKD (chronic kidney disease) stage 3, GFR 30-59 ml/min (HCC) 01/02/2016  . Hand paresthesia 07/18/2015  . Carpal tunnel syndrome 06/07/2015  . Family history of colon cancer   . Diabetes mellitus with neurological manifestations ([place]) 04/18/2015  . Bilateral leg edema 04/11/2015  . Cellulitis of leg, right 04/11/2015  . Abnormality of gait 01/03/2015  . Hereditary and idiopathic peripheral neuropathy 01/03/2015  . GERD (gastroesophageal reflux disease) 06/17/2014  . Morbid obesity with BMI of 50.0-59.9, adult ([place]) 09/05/2013  . Hx of colonic polyps 12/15/2012  . Vitamin B12 deficiency 11/03/2012  . Intrinsic asthma 03/23/2012  . Chronic diastolic heart failure ([place]) 02/20/2011  . OSA (obstructive sleep apnea) 09/17/2010  . URINARY URGENCY 01/08/2010  . CAD, NATIVE VESSEL 11/20/2008  . OSTEOARTHRITIS, KNEES, BILATERAL, SEVERE 06/14/2008  . Hyperlipidemia 05/10/2007  . Essential hypertension 01/18/2007  . HYPOKALEMIA 04/30/2006  . HX, PERSONAL, PEPTIC ULCER DISEASE 04/30/2006   Current Outpatient Medications on File Prior to Visit  Medication Sig Dispense Refill  . albuterol (PROVENTIL HFA) 108 (90 Base) MCG/ACT inhaler  Inhale 2 puffs into the lungs every 4 (four) hours as needed for wheezing or shortness of breath. 1 Inhaler 4  . albuterol (PROVENTIL) (2.5 MG/3ML) 0.083% nebulizer solution Take 3 mLs (2.5 mg total) by nebulization every 6 (six) hours as needed for wheezing or shortness of breath. 75 mL 12  . Amino Acids-Protein Hydrolys (FEEDING SUPPLEMENT, PRO-STAT SUGAR FREE 64,) LIQD Take 30 mLs by mouth 3 (three) times daily with meals. 2700 mL 3  . aspirin (ECOTRIN LOW STRENGTH) 81 MG EC tablet Take 81 mg by mouth at bedtime.     Marland Kitchen atorvastatin (LIPITOR) 20 MG tablet Take 1 tablet (20 mg total) by mouth daily. 30 tablet 0  . budesonide-formoterol (SYMBICORT) 160-4.5 MCG/ACT inhaler one - two inhalations every 12 hours; gargle and spit after use (Patient taking differently: Inhale 2 puffs into the lungs 2 (two) times daily as needed (shortness of breath). gargle and spit after use) 1 Inhaler 4  . carvedilol (COREG) 25 MG tablet TAKE 1 TABLET BY MOUTH TWICE DAILY WITH A MEAL 60 tablet 5  . clotrimazole (LOTRIMIN) 1 % external solution Apply 1 application topically 2 (two) times daily. In between toes 60 mL 5  . Cod Liver Oil CAPS Take 1 capsule by mouth daily.     . cyanocobalamin (,VITAMIN B-12,) 1000 MCG/ML injection INJECT 1ML INTO MUSCLE EVERY 30 DAYS 10 mL 1  . diclofenac sodium (VOLTAREN) 1 % GEL Apply 2 g topically 4 (four) times daily. 100 g 11  . docusate sodium (COLACE) 100 MG capsule Take 2-3 capsules (200-300 mg total) by mouth daily. 90 capsule 11  . DULoxetine (CYMBALTA) 60 MG capsule Take 1  capsule (60 mg total) by mouth daily. 90 capsule 4  . Flaxseed, Linseed, (FLAX SEED OIL) 1000 MG CAPS Take 1,000 mg by mouth 2 (two) times daily.     . fluticasone (FLONASE) 50 MCG/ACT nasal spray Place 2 sprays into both nostrils daily. 16 g 0  . folic acid (FOLVITE) 382 MCG tablet Take 800 mcg by mouth at bedtime.     . furosemide (LASIX) 40 MG tablet Take 3 tablets (120 mg total) by mouth 2 (two) times  daily. 360 tablet 5  . Lancets (ONETOUCH ULTRASOFT) lancets Use to help check blood sugars twice a day Dx E11.9 100 each 3  . lidocaine (GLYDO) 2 % jelly Apply 2 grams to affected area, if needed. 30 mL 11  . Multiple Vitamin (MULTIVITAMIN) tablet Take 1 tablet by mouth daily.      Marland Kitchen NEEDLE, DISP, 23 G (EASY TOUCH FLIPLOCK NEEDLES) 23G X 1-1/2" MISC Use to inject B12 once monthly. 50 each 0  . nystatin cream (MYCOSTATIN) Apply 1 application topically 2 (two) times daily. 30 g 2  . Omega-3 Fatty Acids (FISH OIL) 1000 MG CAPS Take 1,000 mg by mouth daily.     Marland Kitchen omeprazole (PRILOSEC) 40 MG capsule Take 1 capsule (40 mg total) by mouth 2 (two) times daily. 60 capsule 6  . OXcarbazepine (TRILEPTAL) 150 MG tablet Take 1 tablet (150 mg total) by mouth 2 (two) times daily. 60 tablet 11  . potassium chloride SA (K-DUR,KLOR-CON) 20 MEQ tablet TAKE 3 TABLETS BY MOUTH DAILY. 270 tablet 1  . RA COL-RITE 100 MG capsule take 1 capsule by mouth twice a day if needed for MILD CONSTIPATION 60 capsule 0  . simvastatin (ZOCOR) 40 MG tablet TAKE 1 TABLET BY MOUTH AT BEDTIME 90 tablet 1  . spironolactone (ALDACTONE) 25 MG tablet TAKE 1 TABLET BY MOUTH ONCE DAILY 90 tablet 0  . SYRINGE-NEEDLE, DISP, 3 ML 23G X 1" 3 ML MISC Use with B12 to inject IM every 30 days 50 each 0  . topiramate (TOPAMAX) 100 MG tablet Take 1 tablet (100 mg total) by mouth 2 (two) times daily. 60 tablet 11   No current facility-administered medications on file prior to visit.    Allergies  Allergen Reactions  . Penicillins Rash    She was told not to take it anymore. Has patient had a PCN reaction causing immediate rash, facial/tongue/throat swelling, SOB or lightheadedness with hypotension: Yes Has patient had a PCN reaction causing severe rash involving mucus membranes or skin necrosis: No Has patient had a PCN reaction that required hospitalization: No Has patient had a PCN reaction occurring within the last 10 years: No If all of the  above answers are "NO", then may proceed with Cephalosporin use.'  . Sulfonamide Derivatives Anaphylaxis    REACTION: internal "burning"  . Benazepril Hcl Other (See Comments)    Patient unsure of reaction.  . Dipyridamole   . Estrogens   . Hydrochlorothiazide   . Hydrochlorothiazide W-Triamterene Other (See Comments)    Hypokalemia  . Lotensin  [Benazepril Hcl]   . Metronidazole Other (See Comments)    Patient unsure of reaction.  Marland Kitchen Spironolactone     "kidney problems"  . Sulfa Antibiotics   . Torsemide Other (See Comments)    Patient unsure of reaction.  . Triamterene-Hctz   . Valsartan Other (See Comments)    Patient unsure of reaction.    No results found for this or any previous visit (from the past 2160  hour(s)).  Objective: General: Patient is awake, alert, and oriented x 3 and in no acute distress.  Integument: Skin is warm, dry and supple bilateral. Nails are tender, long, thickened and dystrophic with subungual debris, consistent with onychomycosis, 1-5 bilateral. No signs of infection. No open lesions or preulcerative lesions present bilateral. Unna boots on legs bilateral patient goes to wound care center. Mild Callus to heels and keratotic lesion sub met 5 and 5th base on left, nothing in between toes however there is a dry fissure at right plantar 5th toe at sulcus. Remaining integument unremarkable.  Vasculature:  Dorsalis Pedis pulse 0/4 bilateral. Posterior Tibial pulse  0/4 bilateral. Capillary fill time <5 sec 1-5 bilateral. No hair growth to the level of the digits.Temperature gradient within normal limits. No varicosities present bilateral. Chronic edema present bilateral.   Neurology: The patient has diminished sensation measured with a 5.07/10g Semmes Weinstein Monofilament at all pedal sites bilateral . Vibratory sensation diminished bilateral with tuning fork. No Babinski sign present bilateral.   Musculoskeletal: Right 5th hammertoe and Asymptomatic pes  planus pedal deformities noted bilateral. Muscular strength 4/5 in all lower extremity muscular groups bilateral without pain on range of motion. No tenderness with calf compression bilateral.  Assessment and Plan: Problem List Items Addressed This Visit    None    Visit Diagnoses    Dermatophytosis of nail    -  Primary   Diabetic polyneuropathy associated with type 2 diabetes mellitus (HCC)       Toe pain, bilateral       Lymphedema       Pes planus of both feet       Callus of heel          -Examined patient. -Discussed and educated patient on diabetic foot care, especially with  regards to the vascular, neurological and musculoskeletal systems.  -Stressed the importance of good glycemic control and the detriment of not controlling glucose levels in relation to the foot. -Mechanically debrided all nails 1-5 bilateral using sterile nail nipper and filed with dremel without incident  -Recommend daily skin emollients for dry skin to heels/callus areas  -Continue with edema pumps -Answered all patient questions -Patient to return  in 3 months for at risk foot care -Patient advised to call the office if any problems or questions arise in the meantime.  Landis Martins, DPM

## 2017-11-23 NOTE — Patient Instructions (Signed)
[place] Healthy feet cream to dry skin daily

## 2017-12-03 ENCOUNTER — Encounter: Payer: Self-pay | Admitting: Family

## 2017-12-03 ENCOUNTER — Other Ambulatory Visit (INDEPENDENT_AMBULATORY_CARE_PROVIDER_SITE_OTHER): Payer: Medicare Other

## 2017-12-03 ENCOUNTER — Ambulatory Visit: Payer: Self-pay | Admitting: *Deleted

## 2017-12-03 ENCOUNTER — Telehealth: Payer: Self-pay

## 2017-12-03 ENCOUNTER — Ambulatory Visit (INDEPENDENT_AMBULATORY_CARE_PROVIDER_SITE_OTHER): Payer: Medicare Other | Admitting: Family

## 2017-12-03 VITALS — BP 122/84 | HR 54 | Temp 98.0°F | Ht 65.0 in

## 2017-12-03 DIAGNOSIS — R200 Anesthesia of skin: Secondary | ICD-10-CM

## 2017-12-03 DIAGNOSIS — E1140 Type 2 diabetes mellitus with diabetic neuropathy, unspecified: Secondary | ICD-10-CM | POA: Diagnosis not present

## 2017-12-03 DIAGNOSIS — R202 Paresthesia of skin: Secondary | ICD-10-CM

## 2017-12-03 DIAGNOSIS — L299 Pruritus, unspecified: Secondary | ICD-10-CM

## 2017-12-03 LAB — COMPREHENSIVE METABOLIC PANEL
ALT: 13 U/L (ref 0–35)
AST: 15 U/L (ref 0–37)
Albumin: 3.8 g/dL (ref 3.5–5.2)
Alkaline Phosphatase: 85 U/L (ref 39–117)
BILIRUBIN TOTAL: 0.6 mg/dL (ref 0.2–1.2)
BUN: 19 mg/dL (ref 6–23)
CO2: 32 meq/L (ref 19–32)
CREATININE: 1.24 mg/dL — AB (ref 0.40–1.20)
Calcium: 9.3 mg/dL (ref 8.4–10.5)
Chloride: 101 mEq/L (ref 96–112)
GFR: 55.41 mL/min — AB (ref >60.00)
Glucose, Bld: 106 mg/dL — ABNORMAL HIGH (ref 70–99)
Potassium: 3.8 mEq/L (ref 3.5–5.1)
Sodium: 141 mEq/L (ref 135–145)
TOTAL PROTEIN: 6.8 g/dL (ref 6.0–8.3)

## 2017-12-03 LAB — TSH: TSH: 2.1 u[IU]/mL (ref 0.35–4.50)

## 2017-12-03 LAB — CBC WITH DIFFERENTIAL/PLATELET
BASOS ABS: 0 10*3/uL (ref 0.0–0.1)
Basophils Relative: 0.8 % (ref 0.0–3.0)
Eosinophils Absolute: 0.1 10*3/uL (ref 0.0–0.7)
Eosinophils Relative: 2.1 % (ref 0.0–5.0)
HCT: 39.6 % (ref 36.0–46.0)
Hemoglobin: 12.7 g/dL (ref 12.0–15.0)
Lymphocytes Relative: 27.3 % (ref 12.0–46.0)
Lymphs Abs: 1.3 10*3/uL (ref 0.7–4.0)
MCHC: 32.1 g/dL (ref 30.0–36.0)
MCV: 91.8 fl (ref 78.0–100.0)
MONO ABS: 0.3 10*3/uL (ref 0.1–1.0)
MONOS PCT: 7 % (ref 3.0–12.0)
NEUTROS ABS: 3 10*3/uL (ref 1.4–7.7)
Neutrophils Relative %: 62.8 % (ref 43.0–77.0)
PLATELETS: 162 10*3/uL (ref 150.0–400.0)
RBC: 4.32 Mil/uL (ref 3.87–5.11)
RDW: 15.1 % (ref 11.5–15.5)
WBC: 4.8 10*3/uL (ref 4.0–10.5)

## 2017-12-03 LAB — VITAMIN B12: VITAMIN B 12: 1006 pg/mL — AB (ref 211–911)

## 2017-12-03 LAB — AMMONIA: Ammonia: 26 umol/L (ref 11–35)

## 2017-12-03 LAB — HEMOGLOBIN A1C: Hgb A1c MFr Bld: 6.5 % (ref 4.6–6.5)

## 2017-12-03 MED ORDER — HYDROXYZINE HCL 10 MG PO TABS: 10.0000 mg | ORAL_TABLET | Freq: Three times a day (TID) | ORAL | 0 refills | Status: DC | PRN

## 2017-12-03 MED ORDER — FAMOTIDINE 20 MG PO TABS: 20.0000 mg | ORAL_TABLET | Freq: Two times a day (BID) | ORAL | 0 refills | Status: DC

## 2017-12-03 MED ORDER — PREDNISONE 20 MG PO TABS: 40.0000 mg | ORAL_TABLET | Freq: Every day | ORAL | 0 refills | Status: DC

## 2017-12-03 NOTE — Progress Notes (Signed)
April [name] is a 67 y.o. female with the following history as recorded in [employer]:  Patient Active Problem List   Diagnosis Date Noted  . Body aches 07/22/2017  . Dry skin dermatitis 06/29/2017  . Cough 09/11/2016  . Left shoulder pain 08/14/2016  . Meralgia paresthetica 07/16/2016  . Chronic left-sided low back pain with left-sided sciatica 06/02/2016  . Venous stasis ulcer ([place]) 05/13/2016  . Whole body pain 03/20/2016  . Diabetic peripheral neuropathy ([place]) 03/20/2016  . Osteopenia 01/11/2016  . CKD (chronic kidney disease) stage 3, GFR 30-59 ml/min (HCC) 01/02/2016  . Hand paresthesia 07/18/2015  . Carpal tunnel syndrome 06/07/2015  . Family history of colon cancer   . Diabetes mellitus with neurological manifestations ([place]) 04/18/2015  . Bilateral leg edema 04/11/2015  . Cellulitis of leg, right 04/11/2015  . Abnormality of gait 01/03/2015  . Hereditary and idiopathic peripheral neuropathy 01/03/2015  . GERD (gastroesophageal reflux disease) 06/17/2014  . Morbid obesity with BMI of 50.0-59.9, adult ([place]) 09/05/2013  . Hx of colonic polyps 12/15/2012  . Vitamin B12 deficiency 11/03/2012  . Intrinsic asthma 03/23/2012  . Chronic diastolic heart failure ([place]) 02/20/2011  . OSA (obstructive sleep apnea) 09/17/2010  . URINARY URGENCY 01/08/2010  . CAD, NATIVE VESSEL 11/20/2008  . OSTEOARTHRITIS, KNEES, BILATERAL, SEVERE 06/14/2008  . Hyperlipidemia 05/10/2007  . Essential hypertension 01/18/2007  . HYPOKALEMIA 04/30/2006  . HX, PERSONAL, PEPTIC ULCER DISEASE 04/30/2006    Current Outpatient Medications  Medication Sig Dispense Refill  . albuterol (PROVENTIL HFA) 108 (90 Base) MCG/ACT inhaler Inhale 2 puffs into the lungs every 4 (four) hours as needed for wheezing or shortness of breath. 1 Inhaler 4  . albuterol (PROVENTIL) (2.5 MG/3ML) 0.083% nebulizer solution Take 3 mLs (2.5 mg total) by nebulization every 6 (six) hours as needed for wheezing or shortness of breath.  75 mL 12  . aspirin (ECOTRIN LOW STRENGTH) 81 MG EC tablet Take 81 mg by mouth at bedtime.     . budesonide-formoterol (SYMBICORT) 160-4.5 MCG/ACT inhaler one - two inhalations every 12 hours; gargle and spit after use (Patient taking differently: Inhale 2 puffs into the lungs 2 (two) times daily as needed (shortness of breath). gargle and spit after use) 1 Inhaler 4  . carvedilol (COREG) 25 MG tablet TAKE 1 TABLET BY MOUTH TWICE DAILY WITH A MEAL 60 tablet 5  . clotrimazole (LOTRIMIN) 1 % external solution Apply 1 application topically 2 (two) times daily. In between toes 60 mL 5  . Cod Liver Oil CAPS Take 1 capsule by mouth daily.     . cyanocobalamin (,VITAMIN B-12,) 1000 MCG/ML injection INJECT 1ML INTO MUSCLE EVERY 30 DAYS 10 mL 1  . diclofenac sodium (VOLTAREN) 1 % GEL Apply 2 g topically 4 (four) times daily. 100 g 11  . docusate sodium (COLACE) 100 MG capsule Take 2-3 capsules (200-300 mg total) by mouth daily. 90 capsule 11  . DULoxetine (CYMBALTA) 60 MG capsule Take 1 capsule (60 mg total) by mouth daily. 90 capsule 4  . Flaxseed, Linseed, (FLAX SEED OIL) 1000 MG CAPS Take 1,000 mg by mouth 2 (two) times daily.     . fluticasone (FLONASE) 50 MCG/ACT nasal spray Place 2 sprays into both nostrils daily. 16 g 0  . folic acid (FOLVITE) 244 MCG tablet Take 800 mcg by mouth at bedtime.     . furosemide (LASIX) 40 MG tablet Take 3 tablets (120 mg total) by mouth 2 (two) times daily. 360 tablet 5  .  Lancets (ONETOUCH ULTRASOFT) lancets Use to help check blood sugars twice a day Dx E11.9 100 each 3  . lidocaine (GLYDO) 2 % jelly Apply 2 grams to affected area, if needed. 30 mL 11  . Multiple Vitamin (MULTIVITAMIN) tablet Take 1 tablet by mouth daily.      Marland Kitchen NEEDLE, DISP, 23 G (EASY TOUCH FLIPLOCK NEEDLES) 23G X 1-1/2" MISC Use to inject B12 once monthly. 50 each 0  . nystatin cream (MYCOSTATIN) Apply 1 application topically 2 (two) times daily. 30 g 2  . Omega-3 Fatty Acids (FISH OIL) 1000 MG  CAPS Take 1,000 mg by mouth daily.     . OXcarbazepine (TRILEPTAL) 150 MG tablet Take 1 tablet (150 mg total) by mouth 2 (two) times daily. 60 tablet 11  . potassium chloride SA (K-DUR,KLOR-CON) 20 MEQ tablet TAKE 3 TABLETS BY MOUTH DAILY. 270 tablet 1  . RA COL-RITE 100 MG capsule take 1 capsule by mouth twice a day if needed for MILD CONSTIPATION 60 capsule 0  . simvastatin (ZOCOR) 40 MG tablet TAKE 1 TABLET BY MOUTH AT BEDTIME 90 tablet 1  . spironolactone (ALDACTONE) 25 MG tablet TAKE 1 TABLET BY MOUTH ONCE DAILY 90 tablet 0  . SYRINGE-NEEDLE, DISP, 3 ML 23G X 1" 3 ML MISC Use with B12 to inject IM every 30 days 50 each 0  . famotidine (PEPCID) 20 MG tablet Take 1 tablet (20 mg total) by mouth 2 (two) times daily. 30 tablet 0  . hydrOXYzine (ATARAX/VISTARIL) 10 MG tablet Take 1 tablet (10 mg total) by mouth 3 (three) times daily as needed. 30 tablet 0  . predniSONE (DELTASONE) 20 MG tablet Take 2 tablets (40 mg total) by mouth daily with breakfast. 10 tablet 0   No current facility-administered medications for this visit.     Allergies: Penicillins; Sulfonamide derivatives; Benazepril hcl; Dipyridamole; Estrogens; Hydrochlorothiazide; Hydrochlorothiazide w-triamterene; Lotensin  [benazepril hcl]; Metronidazole; Spironolactone; Sulfa antibiotics; Torsemide; Triamterene-hctz; and Valsartan  Past Medical History:  Diagnosis Date  . Anemia   . Asthma   . CAD (coronary artery disease)   . Carpal tunnel syndrome   . Cellulitis of both lower extremities 04/11/2015  . CHF (congestive heart failure) ([place])   . Colon polyp, hyperplastic 2007 & 2012  . Complication of anesthesia 1999   svt with renal calculi surgery, no problems since  . CTS (carpal tunnel syndrome)   . Diabetes mellitus   . Eczema   . Hyperlipidemia   . Hypertension   . Leg ulcer ([place]) 04/24/2015   Right lateral leg No evidence of an infection Monitor closely Keep edema controlled   . Meralgia paresthetica    Dr. [name]  .  Morbid obesity ([place])   . Morbid obesity ([place])   . Neuropathy    toes and legs  . Osteoarthrosis, unspecified whether generalized or localized, lower leg   . PUD (peptic ulcer disease)   . Shortness of breath dyspnea    with exertion  . Type II or unspecified type diabetes mellitus without mention of complication, not stated as uncontrolled   . Unspecified hereditary and idiopathic peripheral neuropathy   . Vitamin B12 deficiency   . Wound cellulitis    right upper leg, healing well    Past Surgical History:  Procedure Laterality Date  . ABDOMINAL HYSTERECTOMY    . CARDIAC CATHETERIZATION  2002   non obstructive disease  . colonoscopy with polypectomy  2007 & 2012    hyperplastic ;Dr Watt Climes  . COLONOSCOPY WITH  PROPOFOL N/A 06/04/2015   Procedure: COLONOSCOPY WITH PROPOFOL;  Surgeon: Jerene Bears, MD;  Location: [facility];  Service: Gastroenterology;  Laterality: N/A;  . DILATION AND CURETTAGE OF UTERUS     multiple  . HEMORRHOID SURGERY    . renal calculi  12/1997   SVT with induction of anesthesia  . right knee arthroscopy    . TONSILLECTOMY AND ADENOIDECTOMY      Family History  Problem Relation Age of Onset  . Colon cancer Mother   . Prostate cancer Father   . Colon cancer Father   . Diabetes Maternal Aunt   . Breast cancer Maternal Aunt   . Diabetes Maternal Uncle   . Diabetes Paternal Aunt   . Stroke Paternal Aunt        > 65  . Heart disease Paternal Aunt   . Diabetes Paternal Uncle   . Breast cancer Maternal Aunt         X 2  . Breast cancer Cousin     Social History   Tobacco Use  . Smoking status: Never Smoker  . Smokeless tobacco: Never Used  Substance Use Topics  . Alcohol use: No    Alcohol/week: 0.0 standard drinks    Subjective:  Patient presents with concerns for recurrent problems with hives on upper extremities "on and off" x 1 month; denies any new soaps, foods, detergents or medications. Wonders if V-8 could be the trigger; does vaguely  remember that tomatoes have been problematic in the past- admits drinking increased amounts of V8 recently; areas of concerns localized on upper extremities; notes that itching is very intense- limited benefit with Benadryl; no shortness of breath, no difficulty breathing.     Objective:  Vitals:   12/03/17 1427  BP: 122/84  Pulse: (!) 54  Temp: 98 F (36.7 C)  TempSrc: Oral  SpO2: 95%  Height: '5\' 5"'  (1.651 m)    General: Well developed, well nourished, in no acute distress  Skin : Warm and dry. Areas of redness, erythema noted on shoulders bilaterally- macular lesions Head: Normocephalic and atraumatic  Eyes: Sclera and conjunctiva clear; pupils round and reactive to light; extraocular movements intact  Ears: External normal; canals clear; tympanic membranes normal  Oropharynx: Pink, supple. No suspicious lesions  Neck: Supple without thyromegaly, adenopathy  Lungs: Respirations unlabored; clear to auscultation bilaterally without wheeze, rales, rhonchi  CVS exam: normal rate and regular rhythm.  Neurologic: Alert and oriented; speech intact; face symmetrical; in wheelchair;  CNII-XII intact without focal deficit   Assessment:  1. Itching   2. Type 2 diabetes mellitus with diabetic neuropathy, without long-term current use of insulin ([place])   3. Numbness and tingling     Plan:  1. ? Etiology; limit intake of V-8; trial of Prednisone 40 mg x 5 days, hydroxyzine and Pepcid; follow-up to be determined; may need to see allergist and/ or dermatologist if symptoms persist. Check CBC, CMP, Ammonia levels.  2. Update Hgba1c today in preparation for upcoming visit with PCP; 3. Update B12 level today as well as TSH;    No follow-ups on file.  Orders Placed This Encounter  Procedures  . CBC w/Diff    Standing Status:   Future    Number of Occurrences:   1    Standing Expiration Date:   12/03/2018  . Comp Met (CMET)    Standing Status:   Future    Number of Occurrences:   1     Standing Expiration Date:  12/03/2018  . HgB A1c    Standing Status:   Future    Number of Occurrences:   1    Standing Expiration Date:   12/03/2018  . Ammonia    Standing Status:   Future    Number of Occurrences:   1    Standing Expiration Date:   12/03/2018  . TSH    Standing Status:   Future    Number of Occurrences:   1    Standing Expiration Date:   12/03/2018  . B12    Standing Status:   Future    Number of Occurrences:   1    Standing Expiration Date:   12/03/2018    Requested Prescriptions   Signed Prescriptions Disp Refills  . predniSONE (DELTASONE) 20 MG tablet 10 tablet 0    Sig: Take 2 tablets (40 mg total) by mouth daily with breakfast.  . hydrOXYzine (ATARAX/VISTARIL) 10 MG tablet 30 tablet 0    Sig: Take 1 tablet (10 mg total) by mouth 3 (three) times daily as needed.  . famotidine (PEPCID) 20 MG tablet 30 tablet 0    Sig: Take 1 tablet (20 mg total) by mouth 2 (two) times daily.

## 2017-12-03 NOTE — Telephone Encounter (Signed)
Summary: please advice . pt is swollen under her ear   Pt has an appt with dr burns on Monday. Pt does not want to see another provider. Pt is having itching on shoulders /ears etc. Pt has tried benadryl no relief. Pt stopped eating tomatoes still itching     Call to office- they don't have opening with PCP- patient agreed to see another provider. She does want to keep her appointment scheduled on Monday.  Reason for Disposition . [1] MODERATE-SEVERE widespread itching (i.e., interferes with sleep, normal activities or school) AND [2] not improved after 24 hours of itching Care Advice  Answer Assessment - Initial Assessment Questions 1. DESCRIPTION: "Describe the itching you are having."     Itching started on left arm- whelps- raise area, itching started on right 3 weeks ago- recently she reports shoulder and neck itching 2. SEVERITY: "How bad is it?"    - MILD - doesn't interfere with normal activities   - MODERATE - SEVERE: interferes with work, school, sleep, or other activities      moderate 3. SCRATCHING: "Are there any scratch marks? Bleeding?"     yes 4. ONSET: "When did this begin?"      1 month 5. CAUSE: "What do you think is causing the itching?" (ask about swimming pools, pollen, animals, soaps, etc.)     Changing diet 6. OTHER SYMPTOMS: "Do you have any other symptoms?"      No other symptoms 7. PREGNANCY: "Is there any chance you are pregnant?" "When was your last menstrual period?"     n/a  Protocols used: ITCHING [facility]

## 2017-12-03 NOTE — Telephone Encounter (Signed)
Patient requires auth for hydroxyzine

## 2017-12-03 NOTE — Telephone Encounter (Signed)
Patient is seeing Mickel Baas today for this issue.

## 2017-12-06 ENCOUNTER — Ambulatory Visit: Payer: Medicare Other | Admitting: Internal Medicine

## 2017-12-06 ENCOUNTER — Telehealth: Payer: Self-pay | Admitting: Internal Medicine

## 2017-12-06 NOTE — Telephone Encounter (Signed)
Please let her know that labs looked fine; no reason to explain her symptoms. I hope that she is feeling better. Keep planned follow-up with Dr. Quay Burow for next week.

## 2017-12-06 NOTE — Telephone Encounter (Signed)
Saw Mickel Baas on friday

## 2017-12-06 NOTE — Telephone Encounter (Signed)
Patient paid out of pocket so no need to do prior authorization.

## 2017-12-06 NOTE — Telephone Encounter (Signed)
Spoke with patient and info given 

## 2017-12-06 NOTE — Telephone Encounter (Signed)
Copied from [place] [id number]. Topic: General - Inquiry >> Dec 06, 2017  2:16 PM Judyann Munson wrote: Reason for CRM: Patient is calling to advise that she paid out of pocket for her hydroxyzine. She is still wanting a pre authorization completed. She is also requesting her lab results. Please advise

## 2017-12-12 NOTE — Progress Notes (Signed)
Subjective:    Patient ID: April [name], female    DOB: July 09, 1950, 67 y.o.   MRN: [id number]  HPI      Medications and allergies reviewed with patient and updated if appropriate.  Patient Active Problem List   Diagnosis Date Noted  . Body aches 07/22/2017  . Dry skin dermatitis 06/29/2017  . Cough 09/11/2016  . Left shoulder pain 08/14/2016  . Meralgia paresthetica 07/16/2016  . Chronic left-sided low back pain with left-sided sciatica 06/02/2016  . Venous stasis ulcer ([place]) 05/13/2016  . Whole body pain 03/20/2016  . Diabetic peripheral neuropathy ([place]) 03/20/2016  . Osteopenia 01/11/2016  . CKD (chronic kidney disease) stage 3, GFR 30-59 ml/min ([facility]) 01/02/2016  . Hand paresthesia 07/18/2015  . Carpal tunnel syndrome 06/07/2015  . Family history of colon cancer   . Diabetes mellitus with neurological manifestations ([place]) 04/18/2015  . Bilateral leg edema 04/11/2015  . Cellulitis of leg, right 04/11/2015  . Abnormality of gait 01/03/2015  . Hereditary and idiopathic peripheral neuropathy 01/03/2015  . GERD (gastroesophageal reflux disease) 06/17/2014  . Morbid obesity with BMI of 50.0-59.9, adult ([place]) 09/05/2013  . Hx of colonic polyps 12/15/2012  . Vitamin B12 deficiency 11/03/2012  . Intrinsic asthma 03/23/2012  . Chronic diastolic heart failure ([place]) 02/20/2011  . OSA (obstructive sleep apnea) 09/17/2010  . URINARY URGENCY 01/08/2010  . CAD, NATIVE VESSEL 11/20/2008  . OSTEOARTHRITIS, KNEES, BILATERAL, SEVERE 06/14/2008  . Hyperlipidemia 05/10/2007  . Essential hypertension 01/18/2007  . HYPOKALEMIA 04/30/2006  . HX, PERSONAL, PEPTIC ULCER DISEASE 04/30/2006    Current Outpatient Medications on File Prior to Visit  Medication Sig Dispense Refill  . albuterol (PROVENTIL HFA) 108 (90 Base) MCG/ACT inhaler Inhale 2 puffs into the lungs every 4 (four) hours as needed for wheezing or shortness of breath. 1 Inhaler 4  . albuterol (PROVENTIL) (2.5 MG/3ML) 0.083%  nebulizer solution Take 3 mLs (2.5 mg total) by nebulization every 6 (six) hours as needed for wheezing or shortness of breath. 75 mL 12  . aspirin (ECOTRIN LOW STRENGTH) 81 MG EC tablet Take 81 mg by mouth at bedtime.     . budesonide-formoterol (SYMBICORT) 160-4.5 MCG/ACT inhaler one - two inhalations every 12 hours; gargle and spit after use (Patient taking differently: Inhale 2 puffs into the lungs 2 (two) times daily as needed (shortness of breath). gargle and spit after use) 1 Inhaler 4  . carvedilol (COREG) 25 MG tablet TAKE 1 TABLET BY MOUTH TWICE DAILY WITH A MEAL 60 tablet 5  . clotrimazole (LOTRIMIN) 1 % external solution Apply 1 application topically 2 (two) times daily. In between toes 60 mL 5  . Cod Liver Oil CAPS Take 1 capsule by mouth daily.     . cyanocobalamin (,VITAMIN B-12,) 1000 MCG/ML injection INJECT 1ML INTO MUSCLE EVERY 30 DAYS 10 mL 1  . diclofenac sodium (VOLTAREN) 1 % GEL Apply 2 g topically 4 (four) times daily. 100 g 11  . docusate sodium (COLACE) 100 MG capsule Take 2-3 capsules (200-300 mg total) by mouth daily. 90 capsule 11  . DULoxetine (CYMBALTA) 60 MG capsule Take 1 capsule (60 mg total) by mouth daily. 90 capsule 4  . famotidine (PEPCID) 20 MG tablet Take 1 tablet (20 mg total) by mouth 2 (two) times daily. 30 tablet 0  . Flaxseed, Linseed, (FLAX SEED OIL) 1000 MG CAPS Take 1,000 mg by mouth 2 (two) times daily.     . fluticasone (FLONASE) 50 MCG/ACT nasal spray Place  2 sprays into both nostrils daily. 16 g 0  . folic acid (FOLVITE) 130 MCG tablet Take 800 mcg by mouth at bedtime.     . furosemide (LASIX) 40 MG tablet Take 3 tablets (120 mg total) by mouth 2 (two) times daily. 360 tablet 5  . hydrOXYzine (ATARAX/VISTARIL) 10 MG tablet Take 1 tablet (10 mg total) by mouth 3 (three) times daily as needed. 30 tablet 0  . Lancets (ONETOUCH ULTRASOFT) lancets Use to help check blood sugars twice a day Dx E11.9 100 each 3  . lidocaine (GLYDO) 2 % jelly Apply 2 grams  to affected area, if needed. 30 mL 11  . Multiple Vitamin (MULTIVITAMIN) tablet Take 1 tablet by mouth daily.      Marland Kitchen NEEDLE, DISP, 23 G (EASY TOUCH FLIPLOCK NEEDLES) 23G X 1-1/2" MISC Use to inject B12 once monthly. 50 each 0  . nystatin cream (MYCOSTATIN) Apply 1 application topically 2 (two) times daily. 30 g 2  . Omega-3 Fatty Acids (FISH OIL) 1000 MG CAPS Take 1,000 mg by mouth daily.     . OXcarbazepine (TRILEPTAL) 150 MG tablet Take 1 tablet (150 mg total) by mouth 2 (two) times daily. 60 tablet 11  . potassium chloride SA (K-DUR,KLOR-CON) 20 MEQ tablet TAKE 3 TABLETS BY MOUTH DAILY. 270 tablet 1  . predniSONE (DELTASONE) 20 MG tablet Take 2 tablets (40 mg total) by mouth daily with breakfast. 10 tablet 0  . RA COL-RITE 100 MG capsule take 1 capsule by mouth twice a day if needed for MILD CONSTIPATION 60 capsule 0  . simvastatin (ZOCOR) 40 MG tablet TAKE 1 TABLET BY MOUTH AT BEDTIME 90 tablet 1  . spironolactone (ALDACTONE) 25 MG tablet TAKE 1 TABLET BY MOUTH ONCE DAILY 90 tablet 0  . SYRINGE-NEEDLE, DISP, 3 ML 23G X 1" 3 ML MISC Use with B12 to inject IM every 30 days 50 each 0   No current facility-administered medications on file prior to visit.     Past Medical History:  Diagnosis Date  . Anemia   . Asthma   . CAD (coronary artery disease)   . Carpal tunnel syndrome   . Cellulitis of both lower extremities 04/11/2015  . CHF (congestive heart failure) ([place])   . Colon polyp, hyperplastic 2007 & 2012  . Complication of anesthesia 1999   svt with renal calculi surgery, no problems since  . CTS (carpal tunnel syndrome)   . Diabetes mellitus   . Eczema   . Hyperlipidemia   . Hypertension   . Leg ulcer ([place]) 04/24/2015   Right lateral leg No evidence of an infection Monitor closely Keep edema controlled   . Meralgia paresthetica    Dr. [name]  . Morbid obesity ([place])   . Morbid obesity ([place])   . Neuropathy    toes and legs  . Osteoarthrosis, unspecified whether generalized or  localized, lower leg   . PUD (peptic ulcer disease)   . Shortness of breath dyspnea    with exertion  . Type II or unspecified type diabetes mellitus without mention of complication, not stated as uncontrolled   . Unspecified hereditary and idiopathic peripheral neuropathy   . Vitamin B12 deficiency   . Wound cellulitis    right upper leg, healing well    Past Surgical History:  Procedure Laterality Date  . ABDOMINAL HYSTERECTOMY    . CARDIAC CATHETERIZATION  2002   non obstructive disease  . colonoscopy with polypectomy  2007 & 2012  hyperplastic ;Dr Watt Climes  . COLONOSCOPY WITH PROPOFOL N/A 06/04/2015   Procedure: COLONOSCOPY WITH PROPOFOL;  Surgeon: Jerene Bears, MD;  Location: [facility];  Service: Gastroenterology;  Laterality: N/A;  . DILATION AND CURETTAGE OF UTERUS     multiple  . HEMORRHOID SURGERY    . renal calculi  12/1997   SVT with induction of anesthesia  . right knee arthroscopy    . TONSILLECTOMY AND ADENOIDECTOMY      Social History   Socioeconomic History  . Marital status: Married    Spouse name: [name]  . Number of children: 1  . Years of education: BS  . Highest education level: Not on file  Occupational History  . Occupation: Disabled    Employer: RETIRED  Social Needs  . Financial resource strain: Not on file  . Food insecurity:    Worry: Not on file    Inability: Not on file  . Transportation needs:    Medical: Not on file    Non-medical: Not on file  Tobacco Use  . Smoking status: Never Smoker  . Smokeless tobacco: Never Used  Substance and Sexual Activity  . Alcohol use: No    Alcohol/week: 0.0 standard drinks  . Drug use: No  . Sexual activity: Not on file  Lifestyle  . Physical activity:    Days per week: Not on file    Minutes per session: Not on file  . Stress: Not on file  Relationships  . Social connections:    Talks on phone: Not on file    Gets together: Not on file    Attends religious service: Not on file    Active  member of club or organization: Not on file    Attends meetings of clubs or organizations: Not on file    Relationship status: Not on file  Other Topics Concern  . Not on file  Social History Narrative   Patient lives at home with her husband [name] Patient is retired and has a Conservation officer, nature.    Caffeine - some times.   Right handed.    Family History  Problem Relation Age of Onset  . Colon cancer Mother   . Prostate cancer Father   . Colon cancer Father   . Diabetes Maternal Aunt   . Breast cancer Maternal Aunt   . Diabetes Maternal Uncle   . Diabetes Paternal Aunt   . Stroke Paternal Aunt        > 65  . Heart disease Paternal Aunt   . Diabetes Paternal Uncle   . Breast cancer Maternal Aunt         X 2  . Breast cancer Cousin     Review of Systems     Objective:  There were no vitals filed for this visit. There were no vitals filed for this visit. There is no height or weight on file to calculate BMI.  BP Readings from Last 3 Encounters:  12/03/17 122/84  07/22/17 101/63  06/29/17 (!) 184/80    Wt Readings from Last 3 Encounters:  07/22/17 (!) 369 lb (167.4 kg)  06/29/17 (!) 380 lb (172.4 kg)  03/23/17 (!) 361 lb (163.7 kg)     Physical Exam       Assessment & Plan:       This encounter was created in error - please disregard.

## 2017-12-12 NOTE — Patient Instructions (Signed)
Tests ordered today. Your results will be released to [facility] (or called to you) after review, usually within 72hours after test completion. If any changes need to be made, you will be notified at that same time.  All other Health Maintenance issues reviewed.   All recommended immunizations and age-appropriate screenings are up-to-date or discussed.  No immunizations administered today.   Medications reviewed and updated.  Changes include :     Your prescription(s) have been submitted to your pharmacy. Please take as directed and contact our office if you believe you are having problem(s) with the medication(s).  A referral was ordered for   Please followup in    Health Maintenance, Female Adopting a healthy lifestyle and getting preventive care can go a long way to promote health and wellness. Talk with your health care provider about what schedule of regular examinations is right for you. This is a good chance for you to check in with your provider about disease prevention and staying healthy. In between checkups, there are plenty of things you can do on your own. Experts have done a lot of research about which lifestyle changes and preventive measures are most likely to keep you healthy. Ask your health care provider for more information. Weight and diet Eat a healthy diet  Be sure to include plenty of vegetables, fruits, low-fat dairy products, and lean protein.  Do not eat a lot of foods high in solid fats, added sugars, or salt.  Get regular exercise. This is one of the most important things you can do for your health. ? Most adults should exercise for at least 150 minutes each week. The exercise should increase your heart rate and make you sweat (moderate-intensity exercise). ? Most adults should also do strengthening exercises at least twice a week. This is in addition to the moderate-intensity exercise.  Maintain a healthy weight  Body mass index (BMI) is a measurement that  can be used to identify possible weight problems. It estimates body fat based on height and weight. Your health care provider can help determine your BMI and help you achieve or maintain a healthy weight.  For females 50 years of age and older: ? A BMI below 18.5 is considered underweight. ? A BMI of 18.5 to 24.9 is normal. ? A BMI of 25 to 29.9 is considered overweight. ? A BMI of 30 and above is considered obese.  Watch levels of cholesterol and blood lipids  You should start having your blood tested for lipids and cholesterol at 67 years of age, then have this test every 5 years.  You may need to have your cholesterol levels checked more often if: ? Your lipid or cholesterol levels are high. ? You are older than 67 years of age. ? You are at high risk for heart disease.  Cancer screening Lung Cancer  Lung cancer screening is recommended for adults 4-43 years old who are at high risk for lung cancer because of a history of smoking.  A yearly low-dose CT scan of the lungs is recommended for people who: ? Currently smoke. ? Have quit within the past 15 years. ? Have at least a 30-pack-year history of smoking. A pack year is smoking an average of one pack of cigarettes a day for 1 year.  Yearly screening should continue until it has been 15 years since you quit.  Yearly screening should stop if you develop a health problem that would prevent you from having lung cancer treatment.  Breast  Cancer  Practice breast self-awareness. This means understanding how your breasts normally appear and feel.  It also means doing regular breast self-exams. Let your health care provider know about any changes, no matter how small.  If you are in your 20s or 30s, you should have a clinical breast exam (CBE) by a health care provider every 1-3 years as part of a regular health exam.  If you are 40 or older, have a CBE every year. Also consider having a breast X-ray (mammogram) every year.  If  you have a family history of breast cancer, talk to your health care provider about genetic screening.  If you are at high risk for breast cancer, talk to your health care provider about having an MRI and a mammogram every year.  Breast cancer gene (BRCA) assessment is recommended for women who have family members with BRCA-related cancers. BRCA-related cancers include: ? Breast. ? Ovarian. ? Tubal. ? Peritoneal cancers.  Results of the assessment will determine the need for genetic counseling and BRCA1 and BRCA2 testing.  Cervical Cancer Your health care provider may recommend that you be screened regularly for cancer of the pelvic organs (ovaries, uterus, and vagina). This screening involves a pelvic examination, including checking for microscopic changes to the surface of your cervix (Pap test). You may be encouraged to have this screening done every 3 years, beginning at age 21.  For women ages 30-65, health care providers may recommend pelvic exams and Pap testing every 3 years, or they may recommend the Pap and pelvic exam, combined with testing for human papilloma virus (HPV), every 5 years. Some types of HPV increase your risk of cervical cancer. Testing for HPV may also be done on women of any age with unclear Pap test results.  Other health care providers may not recommend any screening for nonpregnant women who are considered low risk for pelvic cancer and who do not have symptoms. Ask your health care provider if a screening pelvic exam is right for you.  If you have had past treatment for cervical cancer or a condition that could lead to cancer, you need Pap tests and screening for cancer for at least 20 years after your treatment. If Pap tests have been discontinued, your risk factors (such as having a new sexual partner) need to be reassessed to determine if screening should resume. Some women have medical problems that increase the chance of getting cervical cancer. In these cases,  your health care provider may recommend more frequent screening and Pap tests.  Colorectal Cancer  This type of cancer can be detected and often prevented.  Routine colorectal cancer screening usually begins at 67 years of age and continues through 67 years of age.  Your health care provider may recommend screening at an earlier age if you have risk factors for colon cancer.  Your health care provider may also recommend using home test kits to check for hidden blood in the stool.  A small camera at the end of a tube can be used to examine your colon directly (sigmoidoscopy or colonoscopy). This is done to check for the earliest forms of colorectal cancer.  Routine screening usually begins at age 50.  Direct examination of the colon should be repeated every 5-10 years through 67 years of age. However, you may need to be screened more often if early forms of precancerous polyps or small growths are found.  Skin Cancer  Check your skin from head to toe regularly.  Tell your   health care provider about any new moles or changes in moles, especially if there is a change in a mole's shape or color.  Also tell your health care provider if you have a mole that is larger than the size of a pencil eraser.  Always use sunscreen. Apply sunscreen liberally and repeatedly throughout the day.  Protect yourself by wearing long sleeves, pants, a wide-brimmed hat, and sunglasses whenever you are outside.  Heart disease, diabetes, and high blood pressure  High blood pressure causes heart disease and increases the risk of stroke. High blood pressure is more likely to develop in: ? People who have blood pressure in the high end of the normal range (130-139/85-89 mm Hg). ? People who are overweight or obese. ? People who are African American.  If you are 18-43 years of age, have your blood pressure checked every 3-5 years. If you are 31 years of age or older, have your blood pressure checked every year.  You should have your blood pressure measured twice-once when you are at a hospital or clinic, and once when you are not at a hospital or clinic. Record the average of the two measurements. To check your blood pressure when you are not at a hospital or clinic, you can use: ? An automated blood pressure machine at a pharmacy. ? A home blood pressure monitor.  If you are between 72 years and 17 years old, ask your health care provider if you should take aspirin to prevent strokes.  Have regular diabetes screenings. This involves taking a blood sample to check your fasting blood sugar level. ? If you are at a normal weight and have a low risk for diabetes, have this test once every three years after 67 years of age. ? If you are overweight and have a high risk for diabetes, consider being tested at a younger age or more often. Preventing infection Hepatitis B  If you have a higher risk for hepatitis B, you should be screened for this virus. You are considered at high risk for hepatitis B if: ? You were born in a country where hepatitis B is common. Ask your health care provider which countries are considered high risk. ? Your parents were born in a high-risk country, and you have not been immunized against hepatitis B (hepatitis B vaccine). ? You have HIV or AIDS. ? You use needles to inject street drugs. ? You live with someone who has hepatitis B. ? You have had sex with someone who has hepatitis B. ? You get hemodialysis treatment. ? You take certain medicines for conditions, including cancer, organ transplantation, and autoimmune conditions.  Hepatitis C  Blood testing is recommended for: ? Everyone born from [birth year] through 1965. ? Anyone with known risk factors for hepatitis C.  Sexually transmitted infections (STIs)  You should be screened for sexually transmitted infections (STIs) including gonorrhea and chlamydia if: ? You are sexually active and are younger than 67 years of  age. ? You are older than 67 years of age and your health care provider tells you that you are at risk for this type of infection. ? Your sexual activity has changed since you were last screened and you are at an increased risk for chlamydia or gonorrhea. Ask your health care provider if you are at risk.  If you do not have HIV, but are at risk, it may be recommended that you take a prescription medicine daily to prevent HIV infection. This is called pre-exposure prophylaxis (  PrEP). You are considered at risk if: ? You are sexually active and do not regularly use condoms or know the HIV status of your partner(s). ? You take drugs by injection. ? You are sexually active with a partner who has HIV.  Talk with your health care provider about whether you are at high risk of being infected with HIV. If you choose to begin PrEP, you should first be tested for HIV. You should then be tested every 3 months for as long as you are taking PrEP. Pregnancy  If you are premenopausal and you may become pregnant, ask your health care provider about preconception counseling.  If you may become pregnant, take 400 to 800 micrograms (mcg) of folic acid every day.  If you want to prevent pregnancy, talk to your health care provider about birth control (contraception). Osteoporosis and menopause  Osteoporosis is a disease in which the bones lose minerals and strength with aging. This can result in serious bone fractures. Your risk for osteoporosis can be identified using a bone density scan.  If you are 103 years of age or older, or if you are at risk for osteoporosis and fractures, ask your health care provider if you should be screened.  Ask your health care provider whether you should take a calcium or vitamin D supplement to lower your risk for osteoporosis.  Menopause may have certain physical symptoms and risks.  Hormone replacement therapy may reduce some of these symptoms and risks. Talk to your health  care provider about whether hormone replacement therapy is right for you. Follow these instructions at home:  Schedule regular health, dental, and eye exams.  Stay current with your immunizations.  Do not use any tobacco products including cigarettes, chewing tobacco, or electronic cigarettes.  If you are pregnant, do not drink alcohol.  If you are breastfeeding, limit how much and how often you drink alcohol.  Limit alcohol intake to no more than 1 drink per day for nonpregnant women. One drink equals 12 ounces of beer, 5 ounces of wine, or 1 ounces of hard liquor.  Do not use street drugs.  Do not share needles.  Ask your health care provider for help if you need support or information about quitting drugs.  Tell your health care provider if you often feel depressed.  Tell your health care provider if you have ever been abused or do not feel safe at home. This information is not intended to replace advice given to you by your health care provider. Make sure you discuss any questions you have with your health care provider. Document Released: 08/04/2010 Document Revised: 06/27/2015 Document Reviewed: 10/23/2014 Elsevier Interactive Patient Education  [employer].

## 2017-12-13 NOTE — Patient Instructions (Addendum)
Tests ordered today. Your results will be released to [place] (or called to you) after review, usually within 72hours after test completion. If any changes need to be made, you will be notified at that same time.  All other Health Maintenance issues reviewed.   All recommended immunizations and age-appropriate screenings are up-to-date or discussed.  No immunizations administered today.   Medications reviewed and updated.  Changes include :  Continue the pepcid and hydroxyzine.  Your prescription(s) have been submitted to your pharmacy. Please take as directed and contact our office if you believe you are having problem(s) with the medication(s).   Please followup in 6 months    Health Maintenance, Female Adopting a healthy lifestyle and getting preventive care can go a long way to promote health and wellness. Talk with your health care provider about what schedule of regular examinations is right for you. This is a good chance for you to check in with your provider about disease prevention and staying healthy. In between checkups, there are plenty of things you can do on your own. Experts have done a lot of research about which lifestyle changes and preventive measures are most likely to keep you healthy. Ask your health care provider for more information. Weight and diet Eat a healthy diet  Be sure to include plenty of vegetables, fruits, low-fat dairy products, and lean protein.  Do not eat a lot of foods high in solid fats, added sugars, or salt.  Get regular exercise. This is one of the most important things you can do for your health. ? Most adults should exercise for at least 150 minutes each week. The exercise should increase your heart rate and make you sweat (moderate-intensity exercise). ? Most adults should also do strengthening exercises at least twice a week. This is in addition to the moderate-intensity exercise.  Maintain a healthy weight  Body mass index (BMI) is a  measurement that can be used to identify possible weight problems. It estimates body fat based on height and weight. Your health care provider can help determine your BMI and help you achieve or maintain a healthy weight.  For females 38 years of age and older: ? A BMI below 18.5 is considered underweight. ? A BMI of 18.5 to 24.9 is normal. ? A BMI of 25 to 29.9 is considered overweight. ? A BMI of 30 and above is considered obese.  Watch levels of cholesterol and blood lipids  You should start having your blood tested for lipids and cholesterol at 67 years of age, then have this test every 5 years.  You may need to have your cholesterol levels checked more often if: ? Your lipid or cholesterol levels are high. ? You are older than 67 years of age. ? You are at high risk for heart disease.  Cancer screening Lung Cancer  Lung cancer screening is recommended for adults 55-54 years old who are at high risk for lung cancer because of a history of smoking.  A yearly low-dose CT scan of the lungs is recommended for people who: ? Currently smoke. ? Have quit within the past 15 years. ? Have at least a 30-pack-year history of smoking. A pack year is smoking an average of one pack of cigarettes a day for 1 year.  Yearly screening should continue until it has been 15 years since you quit.  Yearly screening should stop if you develop a health problem that would prevent you from having lung cancer treatment.  Breast Cancer  Practice breast self-awareness. This means understanding how your breasts normally appear and feel.  It also means doing regular breast self-exams. Let your health care provider know about any changes, no matter how small.  If you are in your 20s or 30s, you should have a clinical breast exam (CBE) by a health care provider every 1-3 years as part of a regular health exam.  If you are 40 or older, have a CBE every year. Also consider having a breast X-ray (mammogram)  every year.  If you have a family history of breast cancer, talk to your health care provider about genetic screening.  If you are at high risk for breast cancer, talk to your health care provider about having an MRI and a mammogram every year.  Breast cancer gene (BRCA) assessment is recommended for women who have family members with BRCA-related cancers. BRCA-related cancers include: ? Breast. ? Ovarian. ? Tubal. ? Peritoneal cancers.  Results of the assessment will determine the need for genetic counseling and BRCA1 and BRCA2 testing.  Cervical Cancer Your health care provider may recommend that you be screened regularly for cancer of the pelvic organs (ovaries, uterus, and vagina). This screening involves a pelvic examination, including checking for microscopic changes to the surface of your cervix (Pap test). You may be encouraged to have this screening done every 3 years, beginning at age 21.  For women ages 30-65, health care providers may recommend pelvic exams and Pap testing every 3 years, or they may recommend the Pap and pelvic exam, combined with testing for human papilloma virus (HPV), every 5 years. Some types of HPV increase your risk of cervical cancer. Testing for HPV may also be done on women of any age with unclear Pap test results.  Other health care providers may not recommend any screening for nonpregnant women who are considered low risk for pelvic cancer and who do not have symptoms. Ask your health care provider if a screening pelvic exam is right for you.  If you have had past treatment for cervical cancer or a condition that could lead to cancer, you need Pap tests and screening for cancer for at least 20 years after your treatment. If Pap tests have been discontinued, your risk factors (such as having a new sexual partner) need to be reassessed to determine if screening should resume. Some women have medical problems that increase the chance of getting cervical  cancer. In these cases, your health care provider may recommend more frequent screening and Pap tests.  Colorectal Cancer  This type of cancer can be detected and often prevented.  Routine colorectal cancer screening usually begins at 67 years of age and continues through 67 years of age.  Your health care provider may recommend screening at an earlier age if you have risk factors for colon cancer.  Your health care provider may also recommend using home test kits to check for hidden blood in the stool.  A small camera at the end of a tube can be used to examine your colon directly (sigmoidoscopy or colonoscopy). This is done to check for the earliest forms of colorectal cancer.  Routine screening usually begins at age 50.  Direct examination of the colon should be repeated every 5-10 years through 67 years of age. However, you may need to be screened more often if early forms of precancerous polyps or small growths are found.  Skin Cancer  Check your skin from head to toe regularly.  Tell your health care   provider about any new moles or changes in moles, especially if there is a change in a mole's shape or color.  Also tell your health care provider if you have a mole that is larger than the size of a pencil eraser.  Always use sunscreen. Apply sunscreen liberally and repeatedly throughout the day.  Protect yourself by wearing long sleeves, pants, a wide-brimmed hat, and sunglasses whenever you are outside.  Heart disease, diabetes, and high blood pressure  High blood pressure causes heart disease and increases the risk of stroke. High blood pressure is more likely to develop in: ? People who have blood pressure in the high end of the normal range (130-139/85-89 mm Hg). ? People who are overweight or obese. ? People who are African American.  If you are 18-39 years of age, have your blood pressure checked every 3-5 years. If you are 40 years of age or older, have your blood  pressure checked every year. You should have your blood pressure measured twice-once when you are at a hospital or clinic, and once when you are not at a hospital or clinic. Record the average of the two measurements. To check your blood pressure when you are not at a hospital or clinic, you can use: ? An automated blood pressure machine at a pharmacy. ? A home blood pressure monitor.  If you are between 55 years and 79 years old, ask your health care provider if you should take aspirin to prevent strokes.  Have regular diabetes screenings. This involves taking a blood sample to check your fasting blood sugar level. ? If you are at a normal weight and have a low risk for diabetes, have this test once every three years after 67 years of age. ? If you are overweight and have a high risk for diabetes, consider being tested at a younger age or more often. Preventing infection Hepatitis B  If you have a higher risk for hepatitis B, you should be screened for this virus. You are considered at high risk for hepatitis B if: ? You were born in a country where hepatitis B is common. Ask your health care provider which countries are considered high risk. ? Your parents were born in a high-risk country, and you have not been immunized against hepatitis B (hepatitis B vaccine). ? You have HIV or AIDS. ? You use needles to inject street drugs. ? You live with someone who has hepatitis B. ? You have had sex with someone who has hepatitis B. ? You get hemodialysis treatment. ? You take certain medicines for conditions, including cancer, organ transplantation, and autoimmune conditions.  Hepatitis C  Blood testing is recommended for: ? Everyone born from 1945 through 1965. ? Anyone with known risk factors for hepatitis C.  Sexually transmitted infections (STIs)  You should be screened for sexually transmitted infections (STIs) including gonorrhea and chlamydia if: ? You are sexually active and are  younger than 67 years of age. ? You are older than 67 years of age and your health care provider tells you that you are at risk for this type of infection. ? Your sexual activity has changed since you were last screened and you are at an increased risk for chlamydia or gonorrhea. Ask your health care provider if you are at risk.  If you do not have HIV, but are at risk, it may be recommended that you take a prescription medicine daily to prevent HIV infection. This is called pre-exposure prophylaxis (PrEP). You   are considered at risk if: ? You are sexually active and do not regularly use condoms or know the HIV status of your partner(s). ? You take drugs by injection. ? You are sexually active with a partner who has HIV.  Talk with your health care provider about whether you are at high risk of being infected with HIV. If you choose to begin PrEP, you should first be tested for HIV. You should then be tested every 3 months for as long as you are taking PrEP. Pregnancy  If you are premenopausal and you may become pregnant, ask your health care provider about preconception counseling.  If you may become pregnant, take 400 to 800 micrograms (mcg) of folic acid every day.  If you want to prevent pregnancy, talk to your health care provider about birth control (contraception). Osteoporosis and menopause  Osteoporosis is a disease in which the bones lose minerals and strength with aging. This can result in serious bone fractures. Your risk for osteoporosis can be identified using a bone density scan.  If you are 65 years of age or older, or if you are at risk for osteoporosis and fractures, ask your health care provider if you should be screened.  Ask your health care provider whether you should take a calcium or vitamin D supplement to lower your risk for osteoporosis.  Menopause may have certain physical symptoms and risks.  Hormone replacement therapy may reduce some of these symptoms and  risks. Talk to your health care provider about whether hormone replacement therapy is right for you. Follow these instructions at home:  Schedule regular health, dental, and eye exams.  Stay current with your immunizations.  Do not use any tobacco products including cigarettes, chewing tobacco, or electronic cigarettes.  If you are pregnant, do not drink alcohol.  If you are breastfeeding, limit how much and how often you drink alcohol.  Limit alcohol intake to no more than 1 drink per day for nonpregnant women. One drink equals 12 ounces of beer, 5 ounces of wine, or 1 ounces of hard liquor.  Do not use street drugs.  Do not share needles.  Ask your health care provider for help if you need support or information about quitting drugs.  Tell your health care provider if you often feel depressed.  Tell your health care provider if you have ever been abused or do not feel safe at home. This information is not intended to replace advice given to you by your health care provider. Make sure you discuss any questions you have with your health care provider. Document Released: 08/04/2010 Document Revised: 06/27/2015 Document Reviewed: 10/23/2014 [employer] Interactive Patient Education  2018 [employer]  

## 2017-12-13 NOTE — Progress Notes (Addendum)
Subjective:    Patient ID: April [name], female    DOB: [birth year], 67 y.o.   MRN: [id number]  HPI She is here for a physical exam.   Hives:  She was here 11/1 for hives.  Initially she thought it was V-8 or tomatoes.  She has not had either for one month.  The itching came back 3 days ago. It is now only on her arms. She denies hives or itching elsewhere. The hydroxyzine helps.  She is taking pepcid.   Leg cramps:  She has them at night and they can last hours. They start in her toes and go all the way up her leg.  She walks and it does not help.  She tries to distract herself but it does not help.  She thinks she drinks more water throughout the day.  She works around the house, but is not exercising beyond that regularly.  Chronic leg edema;  She is taking the lasix daily as prescribed.  This has not been effective on its own to keep her leg edema controlled.  She has tried compression socks and wrapping her legs which helped, but did not keep her swelling controlled.  She has been using the pneumatic compression device daily and that has helped significantly.  She still has some leg edema but it is controlled.  Since using this consistently she has not had any recurrent leg ulcers.    Medications and allergies reviewed with patient and updated if appropriate.  Patient Active Problem List   Diagnosis Date Noted  . Body aches 07/22/2017  . Dry skin dermatitis 06/29/2017  . Cough 09/11/2016  . Left shoulder pain 08/14/2016  . Meralgia paresthetica 07/16/2016  . Chronic left-sided low back pain with left-sided sciatica 06/02/2016  . Venous stasis ulcer ([place]) 05/13/2016  . Whole body pain 03/20/2016  . Diabetic peripheral neuropathy ([place]) 03/20/2016  . Osteopenia 01/11/2016  . CKD (chronic kidney disease) stage 3, GFR 30-59 ml/min ([facility]) 01/02/2016  . Hand paresthesia 07/18/2015  . Carpal tunnel syndrome 06/07/2015  . Family history of colon cancer   . Diabetes mellitus with  neurological manifestations ([place]) 04/18/2015  . Bilateral leg edema 04/11/2015  . Cellulitis of leg, right 04/11/2015  . Abnormality of gait 01/03/2015  . Hereditary and idiopathic peripheral neuropathy 01/03/2015  . GERD (gastroesophageal reflux disease) 06/17/2014  . Morbid obesity with BMI of 50.0-59.9, adult ([place]) 09/05/2013  . Hx of colonic polyps 12/15/2012  . Vitamin B12 deficiency 11/03/2012  . Intrinsic asthma 03/23/2012  . Chronic diastolic heart failure ([place]) 02/20/2011  . OSA (obstructive sleep apnea) 09/17/2010  . URINARY URGENCY 01/08/2010  . CAD, NATIVE VESSEL 11/20/2008  . OSTEOARTHRITIS, KNEES, BILATERAL, SEVERE 06/14/2008  . Hyperlipidemia 05/10/2007  . Essential hypertension 01/18/2007  . HYPOKALEMIA 04/30/2006  . HX, PERSONAL, PEPTIC ULCER DISEASE 04/30/2006    Current Outpatient Medications on File Prior to Visit  Medication Sig Dispense Refill  . albuterol (PROVENTIL HFA) 108 (90 Base) MCG/ACT inhaler Inhale 2 puffs into the lungs every 4 (four) hours as needed for wheezing or shortness of breath. 1 Inhaler 4  . albuterol (PROVENTIL) (2.5 MG/3ML) 0.083% nebulizer solution Take 3 mLs (2.5 mg total) by nebulization every 6 (six) hours as needed for wheezing or shortness of breath. 75 mL 12  . aspirin (ECOTRIN LOW STRENGTH) 81 MG EC tablet Take 81 mg by mouth at bedtime.     . budesonide-formoterol (SYMBICORT) 160-4.5 MCG/ACT inhaler one - two inhalations every 12  hours; gargle and spit after use (Patient taking differently: Inhale 2 puffs into the lungs 2 (two) times daily as needed (shortness of breath). gargle and spit after use) 1 Inhaler 4  . carvedilol (COREG) 25 MG tablet TAKE 1 TABLET BY MOUTH TWICE DAILY WITH A MEAL 60 tablet 5  . clotrimazole (LOTRIMIN) 1 % external solution Apply 1 application topically 2 (two) times daily. In between toes 60 mL 5  . Cod Liver Oil CAPS Take 1 capsule by mouth daily.     . cyanocobalamin (,VITAMIN B-12,) 1000 MCG/ML  injection INJECT 1ML INTO MUSCLE EVERY 30 DAYS 10 mL 1  . diclofenac sodium (VOLTAREN) 1 % GEL Apply 2 g topically 4 (four) times daily. 100 g 11  . docusate sodium (COLACE) 100 MG capsule Take 2-3 capsules (200-300 mg total) by mouth daily. 90 capsule 11  . DULoxetine (CYMBALTA) 60 MG capsule Take 1 capsule (60 mg total) by mouth daily. 90 capsule 4  . famotidine (PEPCID) 20 MG tablet Take 1 tablet (20 mg total) by mouth 2 (two) times daily. 30 tablet 0  . Flaxseed, Linseed, (FLAX SEED OIL) 1000 MG CAPS Take 1,000 mg by mouth 2 (two) times daily.     . fluticasone (FLONASE) 50 MCG/ACT nasal spray Place 2 sprays into both nostrils daily. 16 g 0  . folic acid (FOLVITE) 841 MCG tablet Take 800 mcg by mouth at bedtime.     . furosemide (LASIX) 40 MG tablet Take 3 tablets (120 mg total) by mouth 2 (two) times daily. 360 tablet 5  . hydrOXYzine (ATARAX/VISTARIL) 10 MG tablet Take 1 tablet (10 mg total) by mouth 3 (three) times daily as needed. 30 tablet 0  . Lancets (ONETOUCH ULTRASOFT) lancets Use to help check blood sugars twice a day Dx E11.9 100 each 3  . lidocaine (GLYDO) 2 % jelly Apply 2 grams to affected area, if needed. 30 mL 11  . Multiple Vitamin (MULTIVITAMIN) tablet Take 1 tablet by mouth daily.      Marland Kitchen NEEDLE, DISP, 23 G (EASY TOUCH FLIPLOCK NEEDLES) 23G X 1-1/2" MISC Use to inject B12 once monthly. 50 each 0  . nystatin cream (MYCOSTATIN) Apply 1 application topically 2 (two) times daily. 30 g 2  . Omega-3 Fatty Acids (FISH OIL) 1000 MG CAPS Take 1,000 mg by mouth daily.     . OXcarbazepine (TRILEPTAL) 150 MG tablet Take 1 tablet (150 mg total) by mouth 2 (two) times daily. 60 tablet 11  . potassium chloride SA (K-DUR,KLOR-CON) 20 MEQ tablet TAKE 3 TABLETS BY MOUTH DAILY. 270 tablet 1  . RA COL-RITE 100 MG capsule take 1 capsule by mouth twice a day if needed for MILD CONSTIPATION 60 capsule 0  . simvastatin (ZOCOR) 40 MG tablet TAKE 1 TABLET BY MOUTH AT BEDTIME 90 tablet 1  .  spironolactone (ALDACTONE) 25 MG tablet TAKE 1 TABLET BY MOUTH ONCE DAILY 90 tablet 0  . SYRINGE-NEEDLE, DISP, 3 ML 23G X 1" 3 ML MISC Use with B12 to inject IM every 30 days 50 each 0  . predniSONE (DELTASONE) 20 MG tablet Take 2 tablets (40 mg total) by mouth daily with breakfast. (Patient not taking: Reported on 12/14/2017) 10 tablet 0   No current facility-administered medications on file prior to visit.     Past Medical History:  Diagnosis Date  . Anemia   . Asthma   . CAD (coronary artery disease)   . Carpal tunnel syndrome   . Cellulitis of both  lower extremities 04/11/2015  . CHF (congestive heart failure) ([place])   . Colon polyp, hyperplastic 2007 & 2012  . Complication of anesthesia 1999   svt with renal calculi surgery, no problems since  . CTS (carpal tunnel syndrome)   . Diabetes mellitus   . Eczema   . Hyperlipidemia   . Hypertension   . Leg ulcer ([place]) 04/24/2015   Right lateral leg No evidence of an infection Monitor closely Keep edema controlled   . Meralgia paresthetica    Dr. [name]  . Morbid obesity ([place])   . Morbid obesity ([place])   . Neuropathy    toes and legs  . Osteoarthrosis, unspecified whether generalized or localized, lower leg   . PUD (peptic ulcer disease)   . Shortness of breath dyspnea    with exertion  . Type II or unspecified type diabetes mellitus without mention of complication, not stated as uncontrolled   . Unspecified hereditary and idiopathic peripheral neuropathy   . Vitamin B12 deficiency   . Wound cellulitis    right upper leg, healing well    Past Surgical History:  Procedure Laterality Date  . ABDOMINAL HYSTERECTOMY    . CARDIAC CATHETERIZATION  2002   non obstructive disease  . colonoscopy with polypectomy  2007 & 2012    hyperplastic ;Dr Watt Climes  . COLONOSCOPY WITH PROPOFOL N/A 06/04/2015   Procedure: COLONOSCOPY WITH PROPOFOL;  Surgeon: Jerene Bears, MD;  Location: [facility];  Service: Gastroenterology;  Laterality: N/A;  .  DILATION AND CURETTAGE OF UTERUS     multiple  . HEMORRHOID SURGERY    . renal calculi  12/1997   SVT with induction of anesthesia  . right knee arthroscopy    . TONSILLECTOMY AND ADENOIDECTOMY      Social History   Socioeconomic History  . Marital status: Married    Spouse name: [name]  . Number of children: 1  . Years of education: BS  . Highest education level: Not on file  Occupational History  . Occupation: Disabled    Employer: RETIRED  Social Needs  . Financial resource strain: Not on file  . Food insecurity:    Worry: Not on file    Inability: Not on file  . Transportation needs:    Medical: Not on file    Non-medical: Not on file  Tobacco Use  . Smoking status: Never Smoker  . Smokeless tobacco: Never Used  Substance and Sexual Activity  . Alcohol use: No    Alcohol/week: 0.0 standard drinks  . Drug use: No  . Sexual activity: Not on file  Lifestyle  . Physical activity:    Days per week: Not on file    Minutes per session: Not on file  . Stress: Not on file  Relationships  . Social connections:    Talks on phone: Not on file    Gets together: Not on file    Attends religious service: Not on file    Active member of club or organization: Not on file    Attends meetings of clubs or organizations: Not on file    Relationship status: Not on file  Other Topics Concern  . Not on file  Social History Narrative   Patient lives at home with her husband [name] Patient is retired and has a Conservation officer, nature.    Caffeine - some times.   Right handed.    Family History  Problem Relation Age of Onset  . Colon cancer Mother   .  Prostate cancer Father   . Colon cancer Father   . Diabetes Maternal Aunt   . Breast cancer Maternal Aunt   . Diabetes Maternal Uncle   . Diabetes Paternal Aunt   . Stroke Paternal Aunt        > 65  . Heart disease Paternal Aunt   . Diabetes Paternal Uncle   . Breast cancer Maternal Aunt         X 2  . Breast cancer  Cousin     Review of Systems  Constitutional: Negative for chills and fever.  Eyes: Negative for visual disturbance.  Respiratory: Positive for shortness of breath (chronic, with exertion). Negative for cough and wheezing.   Cardiovascular: Positive for palpitations (with certain activities) and leg swelling (chronic). Negative for chest pain.  Gastrointestinal: Negative for abdominal pain, blood in stool, constipation, diarrhea and nausea.       Reflux controlled  Endocrine: Positive for cold intolerance.  Genitourinary: Negative for dysuria and hematuria.  Musculoskeletal: Positive for arthralgias (knees ) and back pain (sometimes).  Skin: Negative for wound.       No ulcer on lower legs,  Hives/itching on upper arms  Neurological: Positive for light-headedness (postural ). Negative for headaches.  Psychiatric/Behavioral: Negative for dysphoric mood. The patient is not nervous/anxious.        Objective:   Vitals:   12/14/17 0959  BP: 138/74  Pulse: 99  Resp: 18  Temp: 98.7 F (37.1 C)  SpO2: 94%   Filed Weights   12/14/17 0959  Weight: (!) 378 lb (171.5 kg)   Body mass index is 62.9 kg/m.  BP Readings from Last 3 Encounters:  12/14/17 138/74  12/03/17 122/84  07/22/17 101/63    Wt Readings from Last 3 Encounters:  12/14/17 (!) 378 lb (171.5 kg)  07/22/17 (!) 369 lb (167.4 kg)  06/29/17 (!) 380 lb (172.4 kg)     Physical Exam Constitutional: She appears well-developed and well-nourished. No distress.  HENT:  Head: Normocephalic and atraumatic.  Right Ear: External ear normal. Normal ear canal and TM Left Ear: External ear normal.  Normal ear canal and TM Mouth/Throat: Oropharynx is clear and moist.  Eyes: Conjunctivae and EOM are normal.  Neck: Neck supple. No tracheal deviation present. No thyromegaly present.  No carotid bruit  Cardiovascular: Normal rate, regular rhythm and normal heart sounds.   No murmur heard.  B/l mild LE edema.with chronic skin  thickening Pulmonary/Chest: Effort normal and breath sounds normal. No respiratory distress. She has no wheezes. She has no rales.  Breast: deferred  Abdominal: Soft.  Morbidly obese.  She exhibits no distension. There is no tenderness.  Lymphadenopathy: She has no cervical adenopathy.  Skin: Skin is warm and dry. She is not diaphoretic.  Psychiatric: She has a normal mood and affect. Her behavior is normal.        Assessment & Plan:   Physical exam: Screening blood work  reviewed recent blood work, ordered some additional blood work Immunizations   Had shingrix, others up to date Colonoscopy   Up to date  Mammogram   Up to date  Dexa     Up to date  Eye exams    Up to date  EKG  Done 03/2016 Exercise   none Weight   encouraged weight  loss Skin    hives/itching, no other chronic skin changes of concern Substance abuse   none  See Problem List for Assessment and Plan of chronic medical problems.  Follow-up in 6 months

## 2017-12-14 ENCOUNTER — Ambulatory Visit (INDEPENDENT_AMBULATORY_CARE_PROVIDER_SITE_OTHER): Payer: Medicare Other | Admitting: Internal Medicine

## 2017-12-14 ENCOUNTER — Encounter: Payer: Self-pay | Admitting: Internal Medicine

## 2017-12-14 ENCOUNTER — Telehealth: Payer: Self-pay | Admitting: Internal Medicine

## 2017-12-14 ENCOUNTER — Other Ambulatory Visit (INDEPENDENT_AMBULATORY_CARE_PROVIDER_SITE_OTHER): Payer: Medicare Other

## 2017-12-14 ENCOUNTER — Encounter: Payer: Medicare Other | Admitting: Internal Medicine

## 2017-12-14 VITALS — BP 138/74 | HR 99 | Temp 98.7°F | Resp 18 | Ht 65.0 in | Wt 378.0 lb

## 2017-12-14 DIAGNOSIS — I10 Essential (primary) hypertension: Secondary | ICD-10-CM

## 2017-12-14 DIAGNOSIS — N183 Chronic kidney disease, stage 3 unspecified: Secondary | ICD-10-CM

## 2017-12-14 DIAGNOSIS — E782 Mixed hyperlipidemia: Secondary | ICD-10-CM | POA: Diagnosis not present

## 2017-12-14 DIAGNOSIS — R252 Cramp and spasm: Secondary | ICD-10-CM

## 2017-12-14 DIAGNOSIS — Z0000 Encounter for general adult medical examination without abnormal findings: Secondary | ICD-10-CM

## 2017-12-14 DIAGNOSIS — R600 Localized edema: Secondary | ICD-10-CM

## 2017-12-14 DIAGNOSIS — E538 Deficiency of other specified B group vitamins: Secondary | ICD-10-CM

## 2017-12-14 DIAGNOSIS — K219 Gastro-esophageal reflux disease without esophagitis: Secondary | ICD-10-CM

## 2017-12-14 DIAGNOSIS — Z6841 Body Mass Index (BMI) 40.0 and over, adult: Secondary | ICD-10-CM

## 2017-12-14 DIAGNOSIS — E1140 Type 2 diabetes mellitus with diabetic neuropathy, unspecified: Secondary | ICD-10-CM

## 2017-12-14 DIAGNOSIS — M8588 Other specified disorders of bone density and structure, other site: Secondary | ICD-10-CM

## 2017-12-14 LAB — LIPID PANEL
CHOLESTEROL: 175 mg/dL (ref 0–200)
HDL: 47.4 mg/dL (ref >39.00)
LDL Cholesterol: 112 mg/dL — ABNORMAL HIGH (ref 0–99)
NonHDL: 128.07
TRIGLYCERIDES: 80 mg/dL (ref 0.0–149.0)
Total CHOL/HDL Ratio: 4
VLDL: 16 mg/dL (ref 0.0–40.0)

## 2017-12-14 LAB — COMPREHENSIVE METABOLIC PANEL
ALBUMIN: 4 g/dL (ref 3.5–5.2)
ALK PHOS: 89 U/L (ref 39–117)
ALT: 18 U/L (ref 0–35)
AST: 14 U/L (ref 0–37)
BUN: 23 mg/dL (ref 6–23)
CO2: 36 mEq/L — ABNORMAL HIGH (ref 19–32)
Calcium: 9.5 mg/dL (ref 8.4–10.5)
Chloride: 98 mEq/L (ref 96–112)
Creatinine, Ser: 1.35 mg/dL — ABNORMAL HIGH (ref 0.40–1.20)
GFR: 50.22 mL/min — ABNORMAL LOW (ref >60.00)
Glucose, Bld: 160 mg/dL — ABNORMAL HIGH (ref 70–99)
Potassium: 4.1 mEq/L (ref 3.5–5.1)
SODIUM: 141 meq/L (ref 135–145)
TOTAL PROTEIN: 7.2 g/dL (ref 6.0–8.3)
Total Bilirubin: 0.7 mg/dL (ref 0.2–1.2)

## 2017-12-14 LAB — MAGNESIUM: MAGNESIUM: 2.4 mg/dL (ref 1.5–2.5)

## 2017-12-14 MED ORDER — HYDROXYZINE HCL 10 MG PO TABS: 10.0000 mg | ORAL_TABLET | Freq: Three times a day (TID) | ORAL | 0 refills | Status: DC | PRN

## 2017-12-14 MED ORDER — TRIAMCINOLONE ACETONIDE 0.5 % EX OINT: 1.0000 "application " | TOPICAL_OINTMENT | Freq: Two times a day (BID) | CUTANEOUS | 0 refills | Status: DC

## 2017-12-14 MED ORDER — FAMOTIDINE 20 MG PO TABS: 20.0000 mg | ORAL_TABLET | Freq: Two times a day (BID) | ORAL | 3 refills | Status: DC

## 2017-12-14 MED ORDER — CHLORPHENIRAMINE MALEATE 4 MG PO TABS: 4.0000 mg | ORAL_TABLET | ORAL | 1 refills | Status: DC | PRN

## 2017-12-14 NOTE — Assessment & Plan Note (Signed)
Occasional GERD symptoms Improved with Pepcid 20 mg twice daily-continue

## 2017-12-14 NOTE — Assessment & Plan Note (Signed)
Encouraged weight loss 

## 2017-12-14 NOTE — Assessment & Plan Note (Signed)
Nocturnal and daytime leg cramping-starts in feet and works its way up to the whole leg States she drinks enough water May be related to her diuretics Will check CMP, magnesium Can consider a muscle relaxer at nighttime if no causes are found

## 2017-12-14 NOTE — Telephone Encounter (Signed)
An alternative was sent - lets see if it is covered

## 2017-12-14 NOTE — Assessment & Plan Note (Addendum)
Chronic No ulcers Currently controlled with medication and compression device daily Controlled with above - continue compression device daily - this is a medical necessity CMP

## 2017-12-14 NOTE — Assessment & Plan Note (Signed)
BP well controlled Current regimen effective and well tolerated Continue current medications at current doses cmp  

## 2017-12-14 NOTE — Assessment & Plan Note (Signed)
DEXA up-to-date Encourage regular exercise

## 2017-12-14 NOTE — Assessment & Plan Note (Signed)
Stable Following with [facility] CMP

## 2017-12-14 NOTE — Assessment & Plan Note (Signed)
Check lipid panel  Continue daily statin Regular exercise and healthy diet encouraged  

## 2017-12-14 NOTE — Assessment & Plan Note (Signed)
Recent B12 level was good Continue oral supplementation

## 2017-12-14 NOTE — Assessment & Plan Note (Signed)
Diet controlled Recent A1c 6.5% Decrease sugars/carbohydrates Encourage regular exercise and weight loss

## 2017-12-14 NOTE — Telephone Encounter (Signed)
Copied from [place] [id number]. Topic: Quick Communication - Rx Refill/Question >> Dec 14, 2017  1:08 PM Reyne Dumas L wrote: Medication:  hydrOXYzine (ATARAX/VISTARIL) 10 MG tablet - states that pharmacy has told her that this medication isn't covered under her insurance.  Pt wants to know what other medication she can take for itching. Topamax - is what she couldn't remember this morning that she wants to go back on.  Has the patient contacted their pharmacy? yes (Agent: If no, request that the patient contact the pharmacy for the refill.) (Agent: If yes, when and what did the pharmacy advise?)  Preferred Pharmacy (with phone number or street name):   [facility] [phone] - [place], [address] AT [address] [phone] (Phone) [phone] (Fax)  Agent: Please be advised that RX refills may take up to 3 business days. We ask that you follow-up with your pharmacy.

## 2017-12-15 ENCOUNTER — Telehealth: Payer: Self-pay | Admitting: Emergency Medicine

## 2017-12-15 ENCOUNTER — Telehealth: Payer: Self-pay

## 2017-12-15 NOTE — Telephone Encounter (Signed)
Spoke with pt. States the medication that she was requesting in the OV was Topamax. She has taken this in the past and would like a new prescription.

## 2017-12-15 NOTE — Telephone Encounter (Signed)
Let pt know

## 2017-12-15 NOTE — Telephone Encounter (Signed)
topamax can increase the risk of kidney stones and therefore kidney damage - this is not a good medication for her

## 2017-12-15 NOTE — Telephone Encounter (Signed)
Copied from [place] [id number]. Topic: General - Other >> Dec 14, 2017  1:54 PM Berneta Levins wrote: Reason for CRM:   Lovena Le with [facility] [employer] is calling to make sure fax for pneumatic compression device was received for this pt.  They haven't gotten a faxed response yet. Lovena Le can be reached at [phone]. >> Dec 15, 2017 12:31 PM Valla Leaver wrote: Needs medical documentation to support Compression Pump order from Dr. Quay Burow.

## 2017-12-15 NOTE — Telephone Encounter (Signed)
Is this something that can be added to your last note or is what you have acceptable as far as leg cramps?

## 2017-12-16 NOTE — Telephone Encounter (Signed)
Note from this week revised - ok to send this

## 2017-12-16 NOTE — Telephone Encounter (Signed)
She said yes for nerve pain and she was looking at it to help with weight as well.

## 2017-12-16 NOTE — Telephone Encounter (Signed)
For what her nerve pain ?

## 2017-12-16 NOTE — Telephone Encounter (Signed)
Patient would like to know if it is a alternative she could take.   [phone]

## 2017-12-16 NOTE — Telephone Encounter (Signed)
All notes have been sent over to [facility].

## 2017-12-16 NOTE — Telephone Encounter (Signed)
For the nerve pain - I think Dr [name] has tried her on everything, so I can not think of anything additional she can take.   I would not recommend any specific medication for her weight because of her kidney disease and the other medications she is on

## 2017-12-17 NOTE — Telephone Encounter (Signed)
Pt aware of response and expressed understanding.  

## 2017-12-24 ENCOUNTER — Ambulatory Visit: Payer: Medicare Other

## 2018-01-10 ENCOUNTER — Telehealth: Payer: Self-pay | Admitting: Internal Medicine

## 2018-01-10 NOTE — Telephone Encounter (Signed)
Copied from [place] [id number]. Topic: General - Other >> Jan 10, 2018 12:44 PM Adelene Idler wrote: April [name] a nurse within [facility] is calling in stating the patients right lower extremity is red and swollen and warm to the touch some areas where fluid is being pocketed under the skin. Patient has also had some med changes and still having delayed digestion and pressure in esophagus and stomach she states it feels like worms are crawling around in her stomach.  CB# [phone]

## 2018-01-10 NOTE — Telephone Encounter (Signed)
Is office visit needed to discuss further?

## 2018-01-10 NOTE — Telephone Encounter (Signed)
Will you please make an appointment for pt.

## 2018-01-10 NOTE — Telephone Encounter (Signed)
Needs to come in

## 2018-01-10 NOTE — Telephone Encounter (Signed)
LVM to inform patient needs an appointment.

## 2018-01-11 ENCOUNTER — Other Ambulatory Visit: Payer: Self-pay | Admitting: Internal Medicine

## 2018-01-13 NOTE — Progress Notes (Addendum)
Subjective:    Patient ID: Francena Hanly, female    DOB: Nov 03, 1950, 67 y.o.   MRN: [id number]  HPI The patient is here for an acute visit.   Right lower leg swelling:  Her right leg has been hurting and itching for a little while.  4 days ago the nurse came out to rewrap her leg and there were two bumps that looked like there was going to develop into a boil or infection.  Yesterday the nurse came out to change her bandage and it looked worse.   It hurts to touch it .  It was warm to touch.  She denies any fevers or chills.  She denies any open wounds or ulcers.  She has had recurrent cellulitis in both legs, but it has been well since her last infection.  Bilateral leg edema: She has her legs wrapped and in a boot by visiting nurses.  She also uses a compressive device nightly, which helped initially, but at this point her legs are still swollen and now she is presenting with cellulitis.  She is taking her diuretic daily as prescribed.  She continues to have significant bilateral edema/lymphedema.  GERD:  She is taking pepcid twice daily.  She takes Tums as needed and it helps.  She has had some increased GERD symptoms recently and feels like the food is not getting digested as it should at certain times.  She has GERD symptoms depending on what she eats.    Bumps on face - ? allergy: She continues to be occasional bumps on her face and has 3 on her face now.  They do itch a little at times.  She wonders if it is an allergy.  Medications and allergies reviewed with patient and updated if appropriate.  Patient Active Problem List   Diagnosis Date Noted  . Bilateral leg cramps 12/14/2017  . Dry skin dermatitis 06/29/2017  . Left shoulder pain 08/14/2016  . Meralgia paresthetica 07/16/2016  . Chronic left-sided low back pain with left-sided sciatica 06/02/2016  . Venous stasis ulcer ([place]) 05/13/2016  . Whole body pain 03/20/2016  . Diabetic peripheral neuropathy ([place]) 03/20/2016  .  Osteopenia 01/11/2016  . CKD (chronic kidney disease) stage 3, GFR 30-59 ml/min (HCC) 01/02/2016  . Hand paresthesia 07/18/2015  . Carpal tunnel syndrome 06/07/2015  . Family history of colon cancer   . Diabetes mellitus with neurological manifestations ([place]) 04/18/2015  . Bilateral leg edema 04/11/2015  . Cellulitis of leg, right 04/11/2015  . Abnormality of gait 01/03/2015  . Hereditary and idiopathic peripheral neuropathy 01/03/2015  . GERD (gastroesophageal reflux disease) 06/17/2014  . Morbid obesity with BMI of 50.0-59.9, adult ([place]) 09/05/2013  . Hx of colonic polyps 12/15/2012  . Vitamin B12 deficiency 11/03/2012  . Intrinsic asthma 03/23/2012  . Chronic diastolic heart failure ([place]) 02/20/2011  . OSA (obstructive sleep apnea) 09/17/2010  . URINARY URGENCY 01/08/2010  . CAD, NATIVE VESSEL 11/20/2008  . OSTEOARTHRITIS, KNEES, BILATERAL, SEVERE 06/14/2008  . Hyperlipidemia 05/10/2007  . Essential hypertension 01/18/2007  . HYPOKALEMIA 04/30/2006  . HX, PERSONAL, PEPTIC ULCER DISEASE 04/30/2006    Current Outpatient Medications on File Prior to Visit  Medication Sig Dispense Refill  . albuterol (PROVENTIL HFA) 108 (90 Base) MCG/ACT inhaler Inhale 2 puffs into the lungs every 4 (four) hours as needed for wheezing or shortness of breath. 1 Inhaler 4  . albuterol (PROVENTIL) (2.5 MG/3ML) 0.083% nebulizer solution Take 3 mLs (2.5 mg total) by nebulization every 6 (  six) hours as needed for wheezing or shortness of breath. 75 mL 12  . aspirin (ECOTRIN LOW STRENGTH) 81 MG EC tablet Take 81 mg by mouth at bedtime.     . budesonide-formoterol (SYMBICORT) 160-4.5 MCG/ACT inhaler one - two inhalations every 12 hours; gargle and spit after use (Patient taking differently: Inhale 2 puffs into the lungs 2 (two) times daily as needed (shortness of breath). gargle and spit after use) 1 Inhaler 4  . carvedilol (COREG) 25 MG tablet TAKE 1 TABLET BY MOUTH TWICE DAILY WITH A MEAL 60 tablet 5  .  chlorpheniramine (CHLOR-TRIMETON) 4 MG tablet Take 1 tablet (4 mg total) by mouth every 4 (four) hours as needed for allergies. 60 tablet 1  . clotrimazole (LOTRIMIN) 1 % external solution Apply 1 application topically 2 (two) times daily. In between toes 60 mL 5  . Cod Liver Oil CAPS Take 1 capsule by mouth daily.     . cyanocobalamin (,VITAMIN B-12,) 1000 MCG/ML injection INJECT 1ML INTO MUSCLE EVERY 30 DAYS 10 mL 1  . diclofenac sodium (VOLTAREN) 1 % GEL Apply 2 g topically 4 (four) times daily. 100 g 11  . docusate sodium (COLACE) 100 MG capsule Take 2-3 capsules (200-300 mg total) by mouth daily. 90 capsule 11  . DULoxetine (CYMBALTA) 60 MG capsule Take 1 capsule (60 mg total) by mouth daily. 90 capsule 4  . famotidine (PEPCID) 20 MG tablet Take 1 tablet (20 mg total) by mouth 2 (two) times daily. 180 tablet 3  . Flaxseed, Linseed, (FLAX SEED OIL) 1000 MG CAPS Take 1,000 mg by mouth 2 (two) times daily.     . fluticasone (FLONASE) 50 MCG/ACT nasal spray Place 2 sprays into both nostrils daily. 16 g 0  . folic acid (FOLVITE) 915 MCG tablet Take 800 mcg by mouth at bedtime.     . furosemide (LASIX) 40 MG tablet Take 3 tablets (120 mg total) by mouth 2 (two) times daily. 360 tablet 5  . Lancets (ONETOUCH ULTRASOFT) lancets Use to help check blood sugars twice a day Dx E11.9 100 each 3  . lidocaine (GLYDO) 2 % jelly Apply 2 grams to affected area, if needed. 30 mL 11  . Multiple Vitamin (MULTIVITAMIN) tablet Take 1 tablet by mouth daily.      Marland Kitchen NEEDLE, DISP, 23 G (EASY TOUCH FLIPLOCK NEEDLES) 23G X 1-1/2" MISC Use to inject B12 once monthly. 50 each 0  . nystatin cream (MYCOSTATIN) Apply 1 application topically 2 (two) times daily. 30 g 2  . Omega-3 Fatty Acids (FISH OIL) 1000 MG CAPS Take 1,000 mg by mouth daily.     . OXcarbazepine (TRILEPTAL) 150 MG tablet Take 1 tablet (150 mg total) by mouth 2 (two) times daily. 60 tablet 11  . potassium chloride SA (K-DUR,KLOR-CON) 20 MEQ tablet TAKE 3  TABLETS BY MOUTH DAILY. 270 tablet 1  . RA COL-RITE 100 MG capsule take 1 capsule by mouth twice a day if needed for MILD CONSTIPATION 60 capsule 0  . simvastatin (ZOCOR) 40 MG tablet TAKE 1 TABLET BY MOUTH AT BEDTIME 90 tablet 1  . spironolactone (ALDACTONE) 25 MG tablet TAKE 1 TABLET BY MOUTH ONCE DAILY 90 tablet 1  . SYRINGE-NEEDLE, DISP, 3 ML 23G X 1" 3 ML MISC Use with B12 to inject IM every 30 days 50 each 0  . triamcinolone ointment (KENALOG) 0.5 % Apply 1 application topically 2 (two) times daily. 30 g 0   No current facility-administered medications on file  prior to visit.     Past Medical History:  Diagnosis Date  . Anemia   . Asthma   . CAD (coronary artery disease)   . Carpal tunnel syndrome   . Cellulitis of both lower extremities 04/11/2015  . CHF (congestive heart failure) ([place])   . Colon polyp, hyperplastic 2007 & 2012  . Complication of anesthesia 1999   svt with renal calculi surgery, no problems since  . CTS (carpal tunnel syndrome)   . Diabetes mellitus   . Eczema   . Hyperlipidemia   . Hypertension   . Leg ulcer ([place]) 04/24/2015   Right lateral leg No evidence of an infection Monitor closely Keep edema controlled   . Meralgia paresthetica    Dr. [name]  . Morbid obesity ([place])   . Morbid obesity ([place])   . Neuropathy    toes and legs  . Osteoarthrosis, unspecified whether generalized or localized, lower leg   . PUD (peptic ulcer disease)   . Shortness of breath dyspnea    with exertion  . Type II or unspecified type diabetes mellitus without mention of complication, not stated as uncontrolled   . Unspecified hereditary and idiopathic peripheral neuropathy   . Vitamin B12 deficiency   . Wound cellulitis    right upper leg, healing well    Past Surgical History:  Procedure Laterality Date  . ABDOMINAL HYSTERECTOMY    . CARDIAC CATHETERIZATION  2002   non obstructive disease  . colonoscopy with polypectomy  2007 & 2012    hyperplastic ;Dr Watt Climes  .  COLONOSCOPY WITH PROPOFOL N/A 06/04/2015   Procedure: COLONOSCOPY WITH PROPOFOL;  Surgeon: Jerene Bears, MD;  Location: [facility];  Service: Gastroenterology;  Laterality: N/A;  . DILATION AND CURETTAGE OF UTERUS     multiple  . HEMORRHOID SURGERY    . renal calculi  12/1997   SVT with induction of anesthesia  . right knee arthroscopy    . TONSILLECTOMY AND ADENOIDECTOMY      Social History   Socioeconomic History  . Marital status: Married    Spouse name: [name]  . Number of children: 1  . Years of education: BS  . Highest education level: Not on file  Occupational History  . Occupation: Disabled    Employer: RETIRED  Social Needs  . Financial resource strain: Not on file  . Food insecurity:    Worry: Not on file    Inability: Not on file  . Transportation needs:    Medical: Not on file    Non-medical: Not on file  Tobacco Use  . Smoking status: Never Smoker  . Smokeless tobacco: Never Used  Substance and Sexual Activity  . Alcohol use: No    Alcohol/week: 0.0 standard drinks  . Drug use: No  . Sexual activity: Not on file  Lifestyle  . Physical activity:    Days per week: Not on file    Minutes per session: Not on file  . Stress: Not on file  Relationships  . Social connections:    Talks on phone: Not on file    Gets together: Not on file    Attends religious service: Not on file    Active member of club or organization: Not on file    Attends meetings of clubs or organizations: Not on file    Relationship status: Not on file  Other Topics Concern  . Not on file  Social History Narrative   Patient lives at home with her husband (  Dwight) . Patient is retired and has a Conservation officer, nature.    Caffeine - some times.   Right handed.    Family History  Problem Relation Age of Onset  . Colon cancer Mother   . Prostate cancer Father   . Colon cancer Father   . Diabetes Maternal Aunt   . Breast cancer Maternal Aunt   . Diabetes Maternal Uncle   .  Diabetes Paternal Aunt   . Stroke Paternal Aunt        > 65  . Heart disease Paternal Aunt   . Diabetes Paternal Uncle   . Breast cancer Maternal Aunt         X 2  . Breast cancer Cousin     Review of Systems  Constitutional: Negative for chills and fever.  Cardiovascular: Positive for leg swelling.  Gastrointestinal: Positive for abdominal distention. Negative for abdominal pain and nausea.       Increased GERD-more frequently  Skin: Positive for color change. Negative for wound.       Has some lumps on her face that are itchy       Objective:   Vitals:   01/14/18 0950  BP: 128/76  Pulse: 74  Resp: 18  Temp: 98.6 F (37 C)  SpO2: 94%   BP Readings from Last 3 Encounters:  01/14/18 128/76  12/14/17 138/74  12/03/17 122/84   Wt Readings from Last 3 Encounters:  12/14/17 (!) 378 lb (171.5 kg)  07/22/17 (!) 369 lb (167.4 kg)  06/29/17 (!) 380 lb (172.4 kg)   Body mass index is 62.9 kg/m.   Physical Exam Constitutional:      General: She is not in acute distress.    Appearance: Normal appearance. She is obese. She is not ill-appearing, toxic-appearing or diaphoretic.  HENT:     Head: Normocephalic and atraumatic.  Musculoskeletal:     Comments: 1+ right lower extremity edema with generalized erythema ankle to midway up the calf-this area is warm and tender to palpation.  No ulcers or open wounds, no skin breaks. Left lower extremity wrapped-did not examine.  Both legs morbidly obese  Neurological:     Mental Status: She is alert.            Assessment & Plan:   She has been diagnosed with bilateral lower extremity lymphedema, I89.0.  She is tried and failed conservative therapy not limited to compression, [place], elevation and exercise.  She is tried and failed an E 0651 basic compression pump for more than 4 weeks at home.  She presents with upper leg, chest and abdominal edema.  She would benefit from a flex E touch pneumatic compression device to  reduce limb volume and is medically necessary for daily in-home use.   See Problem List for Assessment and Plan of chronic medical problems.

## 2018-01-14 ENCOUNTER — Ambulatory Visit: Payer: Medicare Other | Admitting: Internal Medicine

## 2018-01-14 ENCOUNTER — Encounter: Payer: Self-pay | Admitting: Internal Medicine

## 2018-01-14 VITALS — BP 128/76 | HR 74 | Temp 98.6°F | Resp 18 | Ht 65.0 in

## 2018-01-14 DIAGNOSIS — K219 Gastro-esophageal reflux disease without esophagitis: Secondary | ICD-10-CM

## 2018-01-14 DIAGNOSIS — R229 Localized swelling, mass and lump, unspecified: Secondary | ICD-10-CM | POA: Diagnosis not present

## 2018-01-14 DIAGNOSIS — R600 Localized edema: Secondary | ICD-10-CM | POA: Diagnosis not present

## 2018-01-14 DIAGNOSIS — L03115 Cellulitis of right lower limb: Secondary | ICD-10-CM

## 2018-01-14 MED ORDER — DOXYCYCLINE HYCLATE 100 MG PO TABS: 100.0000 mg | ORAL_TABLET | Freq: Two times a day (BID) | ORAL | 0 refills | Status: DC

## 2018-01-14 NOTE — Assessment & Plan Note (Signed)
She is 3 lumps on her face-?  Hives They do not seem to be as itchy as I would expect hives to be She wonders if they are allergies or not I am unsure of the etiology Discussed that she can see dermatology or possible allergist if she wants

## 2018-01-14 NOTE — Assessment & Plan Note (Addendum)
Chronic bilateral lower extremity edema Still with significant edema Continue 120 mg Lasix twice daily Continue compression device for now.  She will require different treatment long-term Visiting nurses to help monitor her legs and wrap legs

## 2018-01-14 NOTE — Assessment & Plan Note (Signed)
Increased GERD recently Continue Pepcid twice daily Can take Tums as needed Stressed that she needs to avoid the foods that cause the symptoms-she needs to make lifestyle changes Stressed weight loss No adjustment medications

## 2018-01-14 NOTE — Patient Instructions (Addendum)
Take the doxycyline as prescribed.  Call if your symptoms do not improve.   Stop eating the foods that cause heartburn and work on weight loss.  Continue the Pepcid twice daily.

## 2018-01-14 NOTE — Assessment & Plan Note (Signed)
She brought in a picture of her leg from the other day and it looks like there is a round area that was almost developing into an ulcer-today this is not present, but she does have some generalized erythema in the right lower extremity that is warm and tender No ulcers or open wounds We will treat as cellulitis with doxycycline twice daily x10 days Keep leg wrapped Use compressive device daily to help maintain decreased edema Call if no improvement

## 2018-01-19 ENCOUNTER — Ambulatory Visit: Payer: Medicare Other | Admitting: Internal Medicine

## 2018-01-20 ENCOUNTER — Ambulatory Visit: Payer: Medicare Other | Admitting: Internal Medicine

## 2018-01-24 ENCOUNTER — Telehealth: Payer: Self-pay | Admitting: Internal Medicine

## 2018-01-24 ENCOUNTER — Ambulatory Visit: Payer: Self-pay

## 2018-01-24 DIAGNOSIS — L509 Urticaria, unspecified: Secondary | ICD-10-CM

## 2018-01-24 MED ORDER — HYDROXYZINE HCL 10 MG PO TABS: 10.0000 mg | ORAL_TABLET | Freq: Three times a day (TID) | ORAL | 0 refills | Status: DC | PRN

## 2018-01-24 NOTE — Addendum Note (Signed)
Addended by: Sherlene Shams on: 01/24/2018 03:07 PM   Modules accepted: Orders

## 2018-01-24 NOTE — Telephone Encounter (Signed)
Returned call to pt.   C/o increase in welts on neck, chest, and arms. Stated this has been ongoing since October.  Reported the itching is moderate, and very bothersome.  Stated "the welts are red, raised, and dime sized to penny sized."  Is asking about a referral to either Dermatology or an Allergist.  Has tried eliminating certain foods from her diet, but has been unsuccessful in finding the cause of the welts.  Denied any change in soaps, lotions, cosmetics, or exposure to different plants.  Pt's PCP is not avail. This entire week.  Advised would not be able to get a referral initiated by someone else, unless they evaluate her symptoms.  Requested an appt. On Friday; has transportation problems; scheduled appt. On 12/27 at PCP office.  Care advice given per protocol.  Verb. Understanding.  Advised if symptoms worsen, she should go to UC.  Agreed with plan.          Reason for Disposition . Hives persist > 1 week  Answer Assessment - Initial Assessment Questions 1. APPEARANCE: "What does the rash look like?"      Raised, red welts 2. LOCATION: "Where is the rash located?"      Arms, chest, chest , and neck  3. NUMBER: "How many hives are there?"     Several  4. SIZE: "How big are the hives?" (inches, cm, compare to coins) "Do they all look the same or is there lots of variation in shape and size?"      Dime sized to penny sized  5. ONSET: "When did the hives begin?" (Hours or days ago)      Since October 6. ITCHING: "Does it itch?" If so, ask: "How bad is the itch?"    - MILD: doesn't interfere with normal activities   - MODERATE - SEVERE: interferes with work, school, sleep, or other activities      moderate 7. RECURRENT PROBLEM: "Have you had hives before?" If so, ask: "When was the last time?" and "What happened that time?"      Yes; was prescribed Hydroxyzine  8. TRIGGERS: "Were you exposed to any new food, plant, cosmetic product or animal just before the hives began?"     Denied  any of the above  9. OTHER SYMPTOMS: "Do you have any other symptoms?" (e.g., fever, tongue swelling, difficulty breathing, abdominal pain)     Denied any tongue or lip swelling; stated she usually has shortness of breath with her weight, and swelling of legs and feet; reported [place] checked today, and noted her swelling of bilat. Legs had improved.  Denied fever    10. PREGNANCY: "Is there any chance you are pregnant?" "When was your last menstrual period?"       N/a  Protocols used: HIVES-A-AH  Message from Jodie Echevaria sent at 01/24/2018 11:02 AM EST   Patient called to say that she is still breaking out daily with welps and is very itchy on arms on chest and on neck. Asking can she possibly get a referral to an Allergist or a Dermatologist. Patient is requesting a call back please on what to do. Ph# [phone]

## 2018-01-24 NOTE — Telephone Encounter (Signed)
Pt requesting hydroxyzine for itching and whelps to her arms chest and neck. Pt denies fever. Pt stated that she was seen 12/03/17 and 01/14/18. Pt was prescribed hydroxyzine, famotidine and prednisone for symptoms. When pt came in for swelling 01/14/18 pt was still having whelps to face. Pt stated that the hydroxyzine was called 12/05/17 and she stated she did not have to come to an appointment.

## 2018-01-24 NOTE — Telephone Encounter (Signed)
I can refill the medication for her; if she is just coming on 12/27 to talk about the hives, I think we can cancel it. Dr. Quay Burow recommended dermatology or allergist. I can do that referral for her.

## 2018-01-24 NOTE — Telephone Encounter (Signed)
Copied from [place] [id number]. Topic: Quick Communication - Rx Refill/Question >> Jan 24, 2018 11:06 AM Leward Quan A wrote: Medication: Hydroxyzine Hcl 10mg   Has the patient contacted their pharmacy? Yes.   (Agent: If no, request that the patient contact the pharmacy for the refill.) (Agent: If yes, when and what did the pharmacy advise?)  Preferred Pharmacy (with phone number or street name): [facility] [phone] - [place], [address] AT [address] [place] [phone] (Phone) [phone] (Fax)    Agent: Please be advised that RX refills may take up to 3 business days. We ask that you follow-up with your pharmacy.

## 2018-01-25 ENCOUNTER — Telehealth: Payer: Self-pay | Admitting: Internal Medicine

## 2018-01-25 NOTE — Telephone Encounter (Signed)
Copied from [place] [id number]. Topic: Quick Communication - See Telephone Encounter >> Jan 25, 2018  8:50 AM Sheran Luz wrote: CRM for notification. See Telephone encounter for: 01/25/18.  Patient calling stating that she was advised by pharmacy to contact office as the medication hydrOXYzine (ATARAX/VISTARIL) 10 MG tablet will need prior authorization

## 2018-01-27 NOTE — Telephone Encounter (Signed)
Key: [id number]   Started request for prior-authorization on line today.

## 2018-01-28 ENCOUNTER — Ambulatory Visit: Payer: Medicare Other | Admitting: Family

## 2018-01-28 NOTE — Telephone Encounter (Signed)
[facility] called and stated that they need additional clinical information regarding medication. Please advise [phone]

## 2018-01-28 NOTE — Telephone Encounter (Signed)
Called and provided additional and spoke with Suezanne Jacquet today from prior [facility] department.  He will notify if patient has been approved or not.

## 2018-01-31 NOTE — Telephone Encounter (Signed)
Pt spoke to [facility] and the denied her autorization for medicine.  Pt would like to know what to do next as she is still itching.  She states [facility] sent you a fax regarding the decision. Please call pt regarding this

## 2018-02-01 ENCOUNTER — Ambulatory Visit: Payer: Medicare Other

## 2018-02-04 ENCOUNTER — Telehealth: Payer: Self-pay

## 2018-02-04 NOTE — Telephone Encounter (Signed)
Written orders have been faxed over to [facility] to wrap pts left leg as often as the right leg is wrapped per Dr. Quay Burow.

## 2018-02-04 NOTE — Telephone Encounter (Signed)
Pt started taking benadryl and paid for medication out of pocket, will call back if medication does not work by next week.

## 2018-02-07 ENCOUNTER — Telehealth: Payer: Self-pay | Admitting: Internal Medicine

## 2018-02-07 NOTE — Telephone Encounter (Signed)
Called Estill Bamberg back and she stated she did not if with the weight gain you wanted to increase lasix or make any adjustments to her medication. Please advise.

## 2018-02-07 NOTE — Telephone Encounter (Signed)
Copied from [place] [id number]. Topic: Quick Communication - See Telephone Encounter >> Feb 07, 2018 12:40 PM Blase Mess A wrote: CRM for notification. See Telephone encounter for: 02/07/18.  [place] is calling to report the patient's weight has been on a steady incline.  The max weight 380 and she has exceeded that.  Current 387.4 Please advise [phone]

## 2018-02-07 NOTE — Telephone Encounter (Signed)
I would advise that she call Dr. [facility].  At her last visit with her they discussed changing the furosemide to torsemide.  She would be ideal person to do this.

## 2018-02-08 NOTE — Telephone Encounter (Signed)
LVM letting Estill Bamberg know response below.

## 2018-02-15 ENCOUNTER — Other Ambulatory Visit: Payer: Self-pay

## 2018-02-15 ENCOUNTER — Emergency Department (HOSPITAL_COMMUNITY)
Admission: EM | Admit: 2018-02-15 | Discharge: 2018-02-15 | Disposition: A | Discharge status: Home / Self Care | Payer: Medicare Other | Attending: Emergency Medicine | Admitting: Emergency Medicine

## 2018-02-15 ENCOUNTER — Emergency Department (HOSPITAL_COMMUNITY): Payer: Medicare Other

## 2018-02-15 DIAGNOSIS — S81821A Laceration with foreign body, right lower leg, initial encounter: Secondary | ICD-10-CM | POA: Insufficient documentation

## 2018-02-15 DIAGNOSIS — I130 Hypertensive heart and chronic kidney disease with heart failure and stage 1 through stage 4 chronic kidney disease, or unspecified chronic kidney disease: Secondary | ICD-10-CM | POA: Insufficient documentation

## 2018-02-15 DIAGNOSIS — R609 Edema, unspecified: Secondary | ICD-10-CM

## 2018-02-15 DIAGNOSIS — Y999 Unspecified external cause status: Secondary | ICD-10-CM | POA: Insufficient documentation

## 2018-02-15 DIAGNOSIS — R600 Localized edema: Secondary | ICD-10-CM | POA: Insufficient documentation

## 2018-02-15 DIAGNOSIS — Z79899 Other long term (current) drug therapy: Secondary | ICD-10-CM | POA: Insufficient documentation

## 2018-02-15 DIAGNOSIS — I5032 Chronic diastolic (congestive) heart failure: Secondary | ICD-10-CM | POA: Insufficient documentation

## 2018-02-15 DIAGNOSIS — E119 Type 2 diabetes mellitus without complications: Secondary | ICD-10-CM | POA: Diagnosis not present

## 2018-02-15 DIAGNOSIS — J45909 Unspecified asthma, uncomplicated: Secondary | ICD-10-CM | POA: Insufficient documentation

## 2018-02-15 DIAGNOSIS — Y92000 Kitchen of unspecified non-institutional (private) residence as  the place of occurrence of the external cause: Secondary | ICD-10-CM | POA: Diagnosis not present

## 2018-02-15 DIAGNOSIS — N183 Chronic kidney disease, stage 3 (moderate): Secondary | ICD-10-CM | POA: Diagnosis not present

## 2018-02-15 DIAGNOSIS — Y939 Activity, unspecified: Secondary | ICD-10-CM | POA: Insufficient documentation

## 2018-02-15 DIAGNOSIS — W010XXA Fall on same level from slipping, tripping and stumbling without subsequent striking against object, initial encounter: Secondary | ICD-10-CM | POA: Insufficient documentation

## 2018-02-15 DIAGNOSIS — I2510 Atherosclerotic heart disease of native coronary artery without angina pectoris: Secondary | ICD-10-CM | POA: Diagnosis not present

## 2018-02-15 DIAGNOSIS — S81811A Laceration without foreign body, right lower leg, initial encounter: Secondary | ICD-10-CM

## 2018-02-15 DIAGNOSIS — Z7982 Long term (current) use of aspirin: Secondary | ICD-10-CM | POA: Insufficient documentation

## 2018-02-15 MED ORDER — HYDROCODONE-ACETAMINOPHEN 5-325 MG PO TABS
1.0000 | ORAL_TABLET | Freq: Once | ORAL | Status: AC
  Administered 2018-02-15: 1 via ORAL
  Filled 2018-02-15: qty 1

## 2018-02-15 MED ORDER — OXYCODONE-ACETAMINOPHEN 5-325 MG PO TABS
1.0000 | ORAL_TABLET | Freq: Once | ORAL | Status: AC
  Administered 2018-02-15: 1 via ORAL
  Filled 2018-02-15: qty 1

## 2018-02-15 MED ORDER — OXYCODONE-ACETAMINOPHEN 5-325 MG PO TABS: 1.0000 | ORAL_TABLET | Freq: Four times a day (QID) | ORAL | 0 refills | Status: DC | PRN

## 2018-02-15 MED ORDER — DOXYCYCLINE HYCLATE 100 MG PO CAPS: 100.0000 mg | ORAL_CAPSULE | Freq: Two times a day (BID) | ORAL | 0 refills | Status: DC

## 2018-02-15 NOTE — Discharge Planning (Signed)
Pt currently active with [facility]  for RN services.  Resumption of care requested. Office of [facility] notified.  No DME needs identified at this time.

## 2018-02-15 NOTE — ED Notes (Signed)
Per verbal Md orders, placed steri strips and antibiotic ointment on wound

## 2018-02-15 NOTE — ED Provider Notes (Signed)
[facility] Provider Note   CSN: [id number] Arrival date & time: 02/15/18  9937     History   Chief Complaint Chief Complaint  Patient presents with  . Leg Pain    HPI April [name] is a 68 y.o. female.  HPI Patient had a mechanical fall this morning while in her kitchen.  Sustained laceration to the right lower extremity.  Denies any head or neck injury.  Last tetanus was within the last 4 years.  Bandage placed by EMS.  Bleeding is controlled at this time. Past Medical History:  Diagnosis Date  . Anemia   . Asthma   . CAD (coronary artery disease)   . Carpal tunnel syndrome   . Cellulitis of both lower extremities 04/11/2015  . CHF (congestive heart failure) ([place])   . Colon polyp, hyperplastic 2007 & 2012  . Complication of anesthesia 1999   svt with renal calculi surgery, no problems since  . CTS (carpal tunnel syndrome)   . Diabetes mellitus   . Eczema   . Hyperlipidemia   . Hypertension   . Leg ulcer ([place]) 04/24/2015   Right lateral leg No evidence of an infection Monitor closely Keep edema controlled   . Meralgia paresthetica    Dr. [name]  . Morbid obesity ([place])   . Morbid obesity ([place])   . Neuropathy    toes and legs  . Osteoarthrosis, unspecified whether generalized or localized, lower leg   . PUD (peptic ulcer disease)   . Shortness of breath dyspnea    with exertion  . Type II or unspecified type diabetes mellitus without mention of complication, not stated as uncontrolled   . Unspecified hereditary and idiopathic peripheral neuropathy   . Vitamin B12 deficiency   . Wound cellulitis    right upper leg, healing well    Patient Active Problem List   Diagnosis Date Noted  . Cellulitis of right leg 01/14/2018  . Skin lumps 01/14/2018  . Bilateral leg cramps 12/14/2017  . Dry skin dermatitis 06/29/2017  . Left shoulder pain 08/14/2016  . Meralgia paresthetica 07/16/2016  . Chronic left-sided low back pain with  left-sided sciatica 06/02/2016  . Venous stasis ulcer ([place]) 05/13/2016  . Whole body pain 03/20/2016  . Diabetic peripheral neuropathy ([place]) 03/20/2016  . Osteopenia 01/11/2016  . CKD (chronic kidney disease) stage 3, GFR 30-59 ml/min (HCC) 01/02/2016  . Hand paresthesia 07/18/2015  . Carpal tunnel syndrome 06/07/2015  . Family history of colon cancer   . Diabetes mellitus with neurological manifestations ([place]) 04/18/2015  . Bilateral leg edema 04/11/2015  . Cellulitis of leg, right 04/11/2015  . Abnormality of gait 01/03/2015  . Hereditary and idiopathic peripheral neuropathy 01/03/2015  . GERD (gastroesophageal reflux disease) 06/17/2014  . Morbid obesity with BMI of 50.0-59.9, adult ([place]) 09/05/2013  . Hx of colonic polyps 12/15/2012  . Vitamin B12 deficiency 11/03/2012  . Intrinsic asthma 03/23/2012  . Chronic diastolic heart failure ([place]) 02/20/2011  . OSA (obstructive sleep apnea) 09/17/2010  . URINARY URGENCY 01/08/2010  . CAD, NATIVE VESSEL 11/20/2008  . OSTEOARTHRITIS, KNEES, BILATERAL, SEVERE 06/14/2008  . Hyperlipidemia 05/10/2007  . Essential hypertension 01/18/2007  . HYPOKALEMIA 04/30/2006  . HX, PERSONAL, PEPTIC ULCER DISEASE 04/30/2006    Past Surgical History:  Procedure Laterality Date  . ABDOMINAL HYSTERECTOMY    . CARDIAC CATHETERIZATION  2002   non obstructive disease  . colonoscopy with polypectomy  2007 & 2012    hyperplastic ;Dr Watt Climes  .  COLONOSCOPY WITH PROPOFOL N/A 06/04/2015   Procedure: COLONOSCOPY WITH PROPOFOL;  Surgeon: Jerene Bears, MD;  Location: [facility];  Service: Gastroenterology;  Laterality: N/A;  . DILATION AND CURETTAGE OF UTERUS     multiple  . HEMORRHOID SURGERY    . renal calculi  12/1997   SVT with induction of anesthesia  . right knee arthroscopy    . TONSILLECTOMY AND ADENOIDECTOMY       OB History   No obstetric history on file.      Home Medications    Prior to Admission medications   Medication Sig Start Date  End Date Taking? Authorizing Provider  albuterol (PROVENTIL HFA) 108 (90 Base) MCG/ACT inhaler Inhale 2 puffs into the lungs every 4 (four) hours as needed for wheezing or shortness of breath. 09/11/16 09/27/18  [name], MD  albuterol (PROVENTIL) (2.5 MG/3ML) 0.083% nebulizer solution Take 3 mLs (2.5 mg total) by nebulization every 6 (six) hours as needed for wheezing or shortness of breath. 03/23/17   [name], MD  aspirin (ECOTRIN LOW STRENGTH) 81 MG EC tablet Take 81 mg by mouth at bedtime.     [provider]  budesonide-formoterol (SYMBICORT) 160-4.5 MCG/ACT inhaler one - two inhalations every 12 hours; gargle and spit after use Patient taking differently: Inhale 2 puffs into the lungs 2 (two) times daily as needed (shortness of breath). gargle and spit after use 02/19/15   Burns, Claudina Lick, MD  carvedilol (COREG) 25 MG tablet TAKE 1 TABLET BY MOUTH TWICE DAILY WITH A MEAL 07/16/17   Burns, Claudina Lick, MD  chlorpheniramine (CHLOR-TRIMETON) 4 MG tablet Take 1 tablet (4 mg total) by mouth every 4 (four) hours as needed for allergies. 12/14/17   [name], MD  clotrimazole (LOTRIMIN) 1 % external solution Apply 1 application topically 2 (two) times daily. In between toes 11/10/16   Landis Martins, DPM  Cod Liver Oil CAPS Take 1 capsule by mouth daily.     [provider]  cyanocobalamin (,VITAMIN B-12,) 1000 MCG/ML injection INJECT 1ML INTO MUSCLE EVERY 30 DAYS 11/03/17   [name], MD  diclofenac sodium (VOLTAREN) 1 % GEL Apply 2 g topically 4 (four) times daily. 03/20/16   Marcial Pacas, MD  docusate sodium (COLACE) 100 MG capsule Take 2-3 capsules (200-300 mg total) by mouth daily. 08/08/15   [name], MD  doxycycline (VIBRAMYCIN) 100 MG capsule Take 1 capsule (100 mg total) by mouth 2 (two) times daily. One po bid x 7 days 02/15/18   Julianne Rice, MD  DULoxetine (CYMBALTA) 60 MG capsule Take 1 capsule (60 mg total) by mouth daily. 07/22/17   Marcial Pacas, MD    famotidine (PEPCID) 20 MG tablet Take 1 tablet (20 mg total) by mouth 2 (two) times daily. 12/14/17   Burns, Claudina Lick, MD  Flaxseed, Linseed, (FLAX SEED OIL) 1000 MG CAPS Take 1,000 mg by mouth 2 (two) times daily.     [provider]  fluticasone (FLONASE) 50 MCG/ACT nasal spray Place 2 sprays into both nostrils daily. 12/19/15   Nche, Charlene Brooke, NP  folic acid (FOLVITE) 528 MCG tablet Take 800 mcg by mouth at bedtime.     [provider]  furosemide (LASIX) 40 MG tablet Take 3 tablets (120 mg total) by mouth 2 (two) times daily. 08/14/16   [name], MD  hydrOXYzine (ATARAX/VISTARIL) 10 MG tablet Take 1 tablet (10 mg total) by mouth 3 (three) times daily as needed.  01/24/18   Marrian Salvage, FNP  Lancets [facility] ULTRASOFT) lancets Use to help check blood sugars twice a day Dx E11.9 04/29/15   [name], MD  lidocaine (GLYDO) 2 % jelly Apply 2 grams to affected area, if needed. 03/23/16   Marcial Pacas, MD  Multiple Vitamin (MULTIVITAMIN) tablet Take 1 tablet by mouth daily.      [provider]  NEEDLE, DISP, 23 G (EASY TOUCH FLIPLOCK NEEDLES) 23G X 1-1/2" MISC Use to inject B12 once monthly. 06/22/17   [name], MD  nystatin cream (MYCOSTATIN) Apply 1 application topically 2 (two) times daily. 10/26/16   [name], MD  Omega-3 Fatty Acids (FISH OIL) 1000 MG CAPS Take 1,000 mg by mouth daily.     [provider]  OXcarbazepine (TRILEPTAL) 150 MG tablet Take 1 tablet (150 mg total) by mouth 2 (two) times daily. 09/13/17   Marcial Pacas, MD  oxyCODONE-acetaminophen (PERCOCET) 5-325 MG tablet Take 1 tablet by mouth every 6 (six) hours as needed for severe pain. 02/15/18   Julianne Rice, MD  potassium chloride SA (K-DUR,KLOR-CON) 20 MEQ tablet TAKE 3 TABLETS BY MOUTH DAILY. 08/16/17   [name], MD  RA COL-RITE 100 MG capsule take 1 capsule by mouth twice a day if needed for MILD CONSTIPATION 09/10/15   Burns, Claudina Lick, MD  simvastatin  (ZOCOR) 40 MG tablet TAKE 1 TABLET BY MOUTH AT BEDTIME 09/06/17   Burns, Claudina Lick, MD  spironolactone (ALDACTONE) 25 MG tablet TAKE 1 TABLET BY MOUTH ONCE DAILY 01/12/18   Burns, Claudina Lick, MD  SYRINGE-NEEDLE, DISP, 3 ML 23G X 1" 3 ML MISC Use with B12 to inject IM every 30 days 05/06/16   [name], MD  triamcinolone ointment (KENALOG) 0.5 % Apply 1 application topically 2 (two) times daily. 12/14/17   [name], MD    Family History Family History  Problem Relation Age of Onset  . Colon cancer Mother   . Prostate cancer Father   . Colon cancer Father   . Diabetes Maternal Aunt   . Breast cancer Maternal Aunt   . Diabetes Maternal Uncle   . Diabetes Paternal Aunt   . Stroke Paternal Aunt        > 65  . Heart disease Paternal Aunt   . Diabetes Paternal Uncle   . Breast cancer Maternal Aunt         X 2  . Breast cancer Cousin     Social History Social History   Tobacco Use  . Smoking status: Never Smoker  . Smokeless tobacco: Never Used  Substance Use Topics  . Alcohol use: No    Alcohol/week: 0.0 standard drinks  . Drug use: No     Allergies   Penicillins; Sulfonamide derivatives; Benazepril hcl; Dipyridamole; Estrogens; Hydrochlorothiazide; Hydrochlorothiazide w-triamterene; Lotensin  [benazepril hcl]; Metronidazole; Spironolactone; Sulfa antibiotics; Torsemide; Triamterene-hctz; and Valsartan   Review of Systems Review of Systems  Constitutional: Negative for chills and fever.  Respiratory: Negative for shortness of breath.   Cardiovascular: Negative for chest pain.  Gastrointestinal: Negative for abdominal pain.  Musculoskeletal: Negative for arthralgias, back pain and neck pain.  Skin: Positive for wound.  Neurological: Negative for dizziness, syncope, weakness, light-headedness, numbness and headaches.  All other systems reviewed and are negative.    Physical Exam Updated Vital Signs BP (!) 144/75   Pulse 86   Temp 98.2 F (36.8 C) (Oral)   Resp 18    Ht  5\' 5"  (1.651 m)   Wt (!) 176.4 kg   SpO2 98%   BMI 64.73 kg/m   Physical Exam Vitals signs and nursing note reviewed.  Constitutional:      Appearance: Normal appearance. She is well-developed. She is obese.  HENT:     Head: Normocephalic and atraumatic.  Eyes:     Pupils: Pupils are equal, round, and reactive to light.  Neck:     Musculoskeletal: Normal range of motion and neck supple. No neck rigidity or muscular tenderness.     Comments: No posterior midline cervical tenderness to palpation. Cardiovascular:     Rate and Rhythm: Normal rate and regular rhythm.  Pulmonary:     Effort: Pulmonary effort is normal.     Breath sounds: Normal breath sounds.  Abdominal:     General: Bowel sounds are normal.     Palpations: Abdomen is soft.     Tenderness: There is no abdominal tenderness. There is no guarding or rebound.  Musculoskeletal: Normal range of motion.        General: No tenderness.     Right lower leg: Edema present.     Left lower leg: Edema present.     Comments: Patient with significant 3+ edema bilateral lower extremities.  The right lower extremity patient has an anterior horizontal laceration over the mid tibia.  No obvious contamination.  Wound edges are pulled apart.  Lymphadenopathy:     Cervical: No cervical adenopathy.  Skin:    General: Skin is warm and dry.     Findings: No erythema or rash.  Neurological:     General: No focal deficit present.     Mental Status: She is alert and oriented to person, place, and time.  Psychiatric:        Behavior: Behavior normal.      ED Treatments / Results  Labs (all labs ordered are listed, but only abnormal results are displayed) Labs Reviewed - No data to display  EKG None  Radiology Dg Chest 2 View  Result Date: 02/15/2018 CLINICAL DATA:  68 year old who fell in her kitchen this morning and injured the RIGHT LOWER leg and complains of chest pain. Initial encounter. EXAM: CHEST - 2 VIEW COMPARISON:   03/23/2017 and earlier. FINDINGS: AP ERECT and LATERAL images were obtained. Cardiac silhouette upper normal in size to slightly enlarged, unchanged. Thoracic aorta mildly atherosclerotic, unchanged. Hilar and mediastinal contours otherwise unremarkable. Prominent bronchovascular markings diffusely and mild to moderate central peribronchial thickening, unchanged. Lungs otherwise clear. No localized airspace consolidation. No pleural effusions. No pneumothorax. Normal pulmonary vascularity. Degenerative changes and DISH involving the thoracic spine. IMPRESSION: No acute cardiopulmonary disease. Stable mild to moderate changes of chronic bronchitis and/or asthma. Electronically Signed   By: Evangeline Dakin M.D.   On: 02/15/2018 09:28   Dg Tibia/fibula Right  Result Date: 02/15/2018 CLINICAL DATA:  68 year old who fell in her kitchen this morning and injured the RIGHT LOWER leg with a laceration. Initial encounter. EXAM: RIGHT TIBIA AND FIBULA - 2 VIEW COMPARISON:  None. FINDINGS: Soft tissue of laceration anteriorly overlying the distal tibia. No evidence of acute fracture involving the tibia or fibula. Phleboliths in the subcutaneous tissues of the anterior lower leg. Subcutaneous edema diffusely throughout the LOWER leg. Visualized ankle joint intact. Severe osteoarthritis involving the knee joint. IMPRESSION: 1. No acute osseous abnormality. 2. Severe osteoarthritis involving the RIGHT knee joint. Electronically Signed   By: Evangeline Dakin M.D.   On: 02/15/2018 09:26  Procedures Procedures (including critical care time)  Medications Ordered in ED Medications  HYDROcodone-acetaminophen (NORCO/VICODIN) 5-325 MG per tablet 1 tablet (1 tablet Oral Given 02/15/18 0922)  oxyCODONE-acetaminophen (PERCOCET/ROXICET) 5-325 MG per tablet 1 tablet (1 tablet Oral Given 02/15/18 1123)     Initial Impression / Assessment and Plan / ED Course  I have reviewed the triage vital signs and the nursing  notes.  Pertinent labs & imaging results that were available during my care of the patient were reviewed by me and considered in my medical decision making (see chart for details).     Lower extremity laceration which is superficial over the lateral portion and deep into the subcutaneous fat on the medial portion.  Given the significant amount of edema in the area, wound edges are not adjacent and will be difficult to close primarily.  Wound was irrigated well.  Use adhesive strips to try to approximate wound edges as well as can be approximated.  Deeper portion of the wound medially is packed with saline soaked gauze.  Will apply nonadhesive dressing and will need wound care.  Also start on oral antibiotics prophylactically.  Advised to keep legs elevated and use compression stockings. Seen by case management.  Home health arranged for wound care.  Return precautions given. Final Clinical Impressions(s) / ED Diagnoses   Final diagnoses:  Leg laceration, right, initial encounter  Peripheral edema    ED Discharge Orders         Ordered    oxyCODONE-acetaminophen (PERCOCET) 5-325 MG tablet  Every 6 hours PRN     02/15/18 1155    doxycycline (VIBRAMYCIN) 100 MG capsule  2 times daily     02/15/18 1155           Julianne Rice, MD 02/15/18 1158

## 2018-02-15 NOTE — ED Notes (Signed)
Suture cart at bedside 

## 2018-02-15 NOTE — ED Notes (Signed)
ED Provider at bedside. 

## 2018-02-15 NOTE — Discharge Instructions (Addendum)
Keep your legs elevated and use compression stockings as you are able.  We will arrange wound care through your home health staff.

## 2018-02-15 NOTE — ED Triage Notes (Signed)
Pt brought In by ems for c/o leg laceration after she fell while she was in a kitchen ; pt sates she lost her balance but denies any dizziness prior to fall ; pt alert and oriented x 4 ; per ems patient has 3-4 inch laceration to the right leg

## 2018-02-16 ENCOUNTER — Other Ambulatory Visit: Payer: Self-pay | Admitting: Obstetrics & Gynecology

## 2018-02-16 DIAGNOSIS — Z1231 Encounter for screening mammogram for malignant neoplasm of breast: Secondary | ICD-10-CM

## 2018-02-17 ENCOUNTER — Ambulatory Visit: Payer: Self-pay

## 2018-02-17 NOTE — Telephone Encounter (Signed)
April [name] with [facility] reports right leg wound  - 2.8 x 4 cm and 0.4 cm depth. Draining bloody drainage.Needs orders for allgenate to wound bed.Can fax to office or call April [name] at [phone].

## 2018-02-17 NOTE — Telephone Encounter (Signed)
Gave ok for verbal orders for dressing.

## 2018-02-17 NOTE — Telephone Encounter (Signed)
Appears Patient is scheduled to see Dr Raeford Razor for this injury tomorrow.

## 2018-02-18 ENCOUNTER — Ambulatory Visit: Payer: Medicare Other | Admitting: Family Medicine

## 2018-02-18 ENCOUNTER — Encounter: Payer: Self-pay | Admitting: Family Medicine

## 2018-02-18 ENCOUNTER — Encounter

## 2018-02-18 ENCOUNTER — Telehealth: Payer: Self-pay | Admitting: Emergency Medicine

## 2018-02-18 VITALS — BP 124/72 | HR 76 | Resp 16 | Ht 65.0 in

## 2018-02-18 DIAGNOSIS — S81801A Unspecified open wound, right lower leg, initial encounter: Secondary | ICD-10-CM | POA: Diagnosis not present

## 2018-02-18 DIAGNOSIS — S81801S Unspecified open wound, right lower leg, sequela: Secondary | ICD-10-CM | POA: Insufficient documentation

## 2018-02-18 DIAGNOSIS — T148XXA Other injury of unspecified body region, initial encounter: Secondary | ICD-10-CM | POA: Diagnosis not present

## 2018-02-18 NOTE — Assessment & Plan Note (Signed)
This could be related to edema.  Does not appear to be associated with infection. -The blisters on the left lower leg were left alone.  Counseled on management if the blister were to erupt. -The blister on the posterior right thigh was covered with petroleum and Telfa.  Would continue this and counseled on the management. -Counseled wound care nurse on management of these 2 areas.

## 2018-02-18 NOTE — Progress Notes (Signed)
April [name] - 68 y.o. female MRN [id number]  Date of birth: 1951-01-02  SUBJECTIVE:  Including CC & ROS.  No chief complaint on file.   April [name] is a 68 y.o. female that is presenting with a right anterior lower leg laceration with blisters over the lateral left lower leg and a blister that has popped over the right posterior thigh.  She was seen in the emergency department on 1/14.  It was deemed that her right lower leg laceration was not conducive to suture placement due to the amount of edema that she has in her lower legs.  Her wound was packed and a referral to home health wound care was placed.  She was seen yesterday and had her dressings changed.  Is having ongoing pain in these different areas.  Denies any fevers or chills.  Independent review of the tib-fib from 1/14 shows a normal exam.  It does demonstrate severe OA of the right knee.   Review of Systems  Constitutional: Negative for fever.  HENT: Negative for congestion.   Respiratory: Negative for cough.   Cardiovascular: Positive for leg swelling.  Gastrointestinal: Negative for abdominal pain.  Musculoskeletal: Positive for arthralgias and gait problem.  Skin: Positive for wound.  Neurological: Positive for weakness.  Hematological: Negative for adenopathy.  Psychiatric/Behavioral: Negative for agitation.    HISTORY: Past Medical, Surgical, Social, and Family History Reviewed & Updated per EMR.   Pertinent Historical Findings include:  Past Medical History:  Diagnosis Date  . Anemia   . Asthma   . CAD (coronary artery disease)   . Carpal tunnel syndrome   . Cellulitis of both lower extremities 04/11/2015  . CHF (congestive heart failure) ([place])   . Colon polyp, hyperplastic 2007 & 2012  . Complication of anesthesia 1999   svt with renal calculi surgery, no problems since  . CTS (carpal tunnel syndrome)   . Diabetes mellitus   . Eczema   . Hyperlipidemia   . Hypertension   . Leg ulcer ([place]) 04/24/2015   Right lateral leg No evidence of an infection Monitor closely Keep edema controlled   . Meralgia paresthetica    Dr. [name]  . Morbid obesity ([place])   . Morbid obesity ([place])   . Neuropathy    toes and legs  . Osteoarthrosis, unspecified whether generalized or localized, lower leg   . PUD (peptic ulcer disease)   . Shortness of breath dyspnea    with exertion  . Type II or unspecified type diabetes mellitus without mention of complication, not stated as uncontrolled   . Unspecified hereditary and idiopathic peripheral neuropathy   . Vitamin B12 deficiency   . Wound cellulitis    right upper leg, healing well    Past Surgical History:  Procedure Laterality Date  . ABDOMINAL HYSTERECTOMY    . CARDIAC CATHETERIZATION  2002   non obstructive disease  . colonoscopy with polypectomy  2007 & 2012    hyperplastic ;Dr Watt Climes  . COLONOSCOPY WITH PROPOFOL N/A 06/04/2015   Procedure: COLONOSCOPY WITH PROPOFOL;  Surgeon: Jerene Bears, MD;  Location: [facility];  Service: Gastroenterology;  Laterality: N/A;  . DILATION AND CURETTAGE OF UTERUS     multiple  . HEMORRHOID SURGERY    . renal calculi  12/1997   SVT with induction of anesthesia  . right knee arthroscopy    . TONSILLECTOMY AND ADENOIDECTOMY      Allergies  Allergen Reactions  . Penicillins Rash    She  was told not to take it anymore. Has patient had a PCN reaction causing immediate rash, facial/tongue/throat swelling, SOB or lightheadedness with hypotension: Yes Has patient had a PCN reaction causing severe rash involving mucus membranes or skin necrosis: No Has patient had a PCN reaction that required hospitalization: No Has patient had a PCN reaction occurring within the last 10 years: No If all of the above answers are "NO", then may proceed with Cephalosporin use.'  . Sulfonamide Derivatives Anaphylaxis    REACTION: internal "burning"  . Benazepril Hcl Other (See Comments)    Patient unsure of reaction.  . Dipyridamole   .  Estrogens   . Hydrochlorothiazide   . Hydrochlorothiazide W-Triamterene Other (See Comments)    Hypokalemia  . Lotensin  [Benazepril Hcl]   . Metronidazole Other (See Comments)    Patient unsure of reaction.  Marland Kitchen Spironolactone     "kidney problems"  . Sulfa Antibiotics   . Torsemide Other (See Comments)    Patient unsure of reaction.  . Triamterene-Hctz   . Valsartan Other (See Comments)    Patient unsure of reaction.    Family History  Problem Relation Age of Onset  . Colon cancer Mother   . Prostate cancer Father   . Colon cancer Father   . Diabetes Maternal Aunt   . Breast cancer Maternal Aunt   . Diabetes Maternal Uncle   . Diabetes Paternal Aunt   . Stroke Paternal Aunt        > 65  . Heart disease Paternal Aunt   . Diabetes Paternal Uncle   . Breast cancer Maternal Aunt         X 2  . Breast cancer Cousin      Social History   Socioeconomic History  . Marital status: Married    Spouse name: [name]  . Number of children: 1  . Years of education: BS  . Highest education level: Not on file  Occupational History  . Occupation: Disabled    Employer: RETIRED  Social Needs  . Financial resource strain: Not on file  . Food insecurity:    Worry: Not on file    Inability: Not on file  . Transportation needs:    Medical: Not on file    Non-medical: Not on file  Tobacco Use  . Smoking status: Never Smoker  . Smokeless tobacco: Never Used  Substance and Sexual Activity  . Alcohol use: No    Alcohol/week: 0.0 standard drinks  . Drug use: No  . Sexual activity: Not on file  Lifestyle  . Physical activity:    Days per week: Not on file    Minutes per session: Not on file  . Stress: Not on file  Relationships  . Social connections:    Talks on phone: Not on file    Gets together: Not on file    Attends religious service: Not on file    Active member of club or organization: Not on file    Attends meetings of clubs or organizations: Not on file     Relationship status: Not on file  . Intimate partner violence:    Fear of current or ex partner: Not on file    Emotionally abused: Not on file    Physically abused: Not on file    Forced sexual activity: Not on file  Other Topics Concern  . Not on file  Social History Narrative   Patient lives at home with her husband [name] Patient is  retired and has a Conservation officer, nature.    Caffeine - some times.   Right handed.     PHYSICAL EXAM:  VS: BP 124/72   Pulse 76   Resp 16   Ht 5\' 5"  (1.651 m)   SpO2 93%   BMI 64.73 kg/m  Physical Exam Gen: NAD, alert, cooperative with exam,  ENT: normal lips, normal nasal mucosa,  Eye: normal EOM, normal conjunctiva and lids CV:  +3 edema, +2 pedal pulses   Resp: no accessory muscle use, non-labored,  Skin: wound across the mid tibia anteriorly. Has steri strips and clips in place. No redness or streaking. Two blisters on left lateral lower leg. Exposed skin on right posterior mid thigh on right from blister that has been ruptured. No drainage or streaking  Neuro: normal tone, normal sensation to touch Psych:  normal insight, alert and oriented MSK: pain with standing. Neurovascularly intact            ASSESSMENT & PLAN:   I spent 25 minutes with this patient, greater than 50% was face-to-face time counseling regarding the below diagnosis.   Blister This could be related to edema.  Does not appear to be associated with infection. -The blisters on the left lower leg were left alone.  Counseled on management if the blister were to erupt. -The blister on the posterior right thigh was covered with petroleum and Telfa.  Would continue this and counseled on the management. -Counseled wound care nurse on management of these 2 areas.  Wound of right leg Has home health wound care in place.  This area was packed and covered today.  Had overlying Ace wrap in order to add compression. -Counseled ongoing management. -Encouraged and  counseled to change daily. -Counseled home health nurse on management. -Follow-up in 1 week.

## 2018-02-18 NOTE — Assessment & Plan Note (Signed)
Has home health wound care in place.  This area was packed and covered today.  Had overlying Ace wrap in order to add compression. -Counseled ongoing management. -Encouraged and counseled to change daily. -Counseled home health nurse on management. -Follow-up in 1 week.

## 2018-02-18 NOTE — Patient Instructions (Signed)
Nice to meet you  Please change the dressing on a daily basis  Please let us know if your pain worsens  Please follow up in 1 week.

## 2018-02-18 NOTE — Telephone Encounter (Signed)
LVM with Dorian Pod with [facility] giving verbal orders for wound care on Right lower leg every day for 1 week, Right buttocks area every other day for 1 week, and pt seeing Dr Quay Burow in one week. These orders are per Dr Raeford Razor after [place] today 02/18/17.

## 2018-02-22 ENCOUNTER — Ambulatory Visit: Payer: Medicare Other | Admitting: Podiatry

## 2018-02-22 ENCOUNTER — Ambulatory Visit: Payer: Medicare Other | Admitting: Sports Medicine

## 2018-02-22 DIAGNOSIS — B351 Tinea unguium: Secondary | ICD-10-CM

## 2018-02-22 DIAGNOSIS — M79675 Pain in left toe(s): Secondary | ICD-10-CM

## 2018-02-22 DIAGNOSIS — M79674 Pain in right toe(s): Secondary | ICD-10-CM

## 2018-02-22 DIAGNOSIS — E1142 Type 2 diabetes mellitus with diabetic polyneuropathy: Secondary | ICD-10-CM

## 2018-02-22 DIAGNOSIS — Q828 Other specified congenital malformations of skin: Secondary | ICD-10-CM | POA: Diagnosis not present

## 2018-02-23 NOTE — Progress Notes (Signed)
Subjective: 69 y.o. returns the office today for painful, elongated, thickened toenails which she cannot trim herself and for a painful callus on the bottom of her left foot. Denies any redness or drainage around the nails. Since she was last here she fell and caused a wound on the right leg that is currently being treated by Dr. Quay Burow. Home health care has been coming out to the house to change the wound daily. She does not want me to change the dressings at this time. Denies any acute changes since last appointment and no new complaints today. Denies any systemic complaints such as fevers, chills, nausea, vomiting.   PCP: [name], MD  Objective: AAO 3, NAD DP/PT pulses decreased Nails hypertrophic, dystrophic, elongated, brittle, discolored 10. There is tenderness overlying the nails 1-5 bilaterally. There is no surrounding erythema or drainage along the nail sites. There is a thick hyperkeraotic lesion to the lateral 5th metatarsal base plantaraly.  No open lesions or pre-ulcerative lesions are identified. No other areas of tenderness bilateral lower extremities. No overlying edema, erythema, increased warmth. No pain with calf compression, swelling, warmth, erythema.  Assessment: Patient presents with symptomatic onychomycosis  Plan: -Treatment options including alternatives, risks, complications were discussed -Nails sharply debrided 10 without complication/bleeding. -Hyperkeratotic lesion sharply debrided x 1 without any complications or bleeding.  -she did not want the dressing changed on the right foot. Will defer to home health and Dr. Quay Burow.  -Discussed daily foot inspection. If there are any changes, to call the office immediately.  -Follow-up in 3 months or sooner if any problems are to arise. In the meantime, encouraged to call the office with any questions, concerns, changes symptoms.  Celesta Gentile, DPM

## 2018-02-25 ENCOUNTER — Ambulatory Visit: Payer: Medicare Other | Admitting: Family

## 2018-02-25 ENCOUNTER — Other Ambulatory Visit: Payer: Self-pay | Admitting: Internal Medicine

## 2018-02-25 ENCOUNTER — Encounter: Payer: Self-pay | Admitting: Family

## 2018-02-25 VITALS — BP 142/82 | HR 89 | Temp 99.2°F | Ht 65.0 in

## 2018-02-25 DIAGNOSIS — S81801D Unspecified open wound, right lower leg, subsequent encounter: Secondary | ICD-10-CM

## 2018-02-25 DIAGNOSIS — N183 Chronic kidney disease, stage 3 unspecified: Secondary | ICD-10-CM

## 2018-02-25 MED ORDER — DOXYCYCLINE HYCLATE 100 MG PO CAPS: 100.0000 mg | ORAL_CAPSULE | Freq: Two times a day (BID) | ORAL | 0 refills | Status: DC

## 2018-02-25 NOTE — Progress Notes (Signed)
April [name] is a 68 y.o. female with the following history as recorded in EpicCare:  Patient Active Problem List   Diagnosis Date Noted  . Wound of right leg 02/18/2018  . Blister 02/18/2018  . Cellulitis of right leg 01/14/2018  . Skin lumps 01/14/2018  . Bilateral leg cramps 12/14/2017  . Dry skin dermatitis 06/29/2017  . Left shoulder pain 08/14/2016  . Meralgia paresthetica 07/16/2016  . Chronic left-sided low back pain with left-sided sciatica 06/02/2016  . Venous stasis ulcer ([place]) 05/13/2016  . Whole body pain 03/20/2016  . Diabetic peripheral neuropathy ([place]) 03/20/2016  . Osteopenia 01/11/2016  . CKD (chronic kidney disease) stage 3, GFR 30-59 ml/min (HCC) 01/02/2016  . Hand paresthesia 07/18/2015  . Carpal tunnel syndrome 06/07/2015  . Family history of colon cancer   . Diabetes mellitus with neurological manifestations ([place]) 04/18/2015  . Bilateral leg edema 04/11/2015  . Cellulitis of leg, right 04/11/2015  . Abnormality of gait 01/03/2015  . Hereditary and idiopathic peripheral neuropathy 01/03/2015  . GERD (gastroesophageal reflux disease) 06/17/2014  . Morbid obesity with BMI of 50.0-59.9, adult ([place]) 09/05/2013  . Hx of colonic polyps 12/15/2012  . Vitamin B12 deficiency 11/03/2012  . Intrinsic asthma 03/23/2012  . Chronic diastolic heart failure ([place]) 02/20/2011  . OSA (obstructive sleep apnea) 09/17/2010  . URINARY URGENCY 01/08/2010  . CAD, NATIVE VESSEL 11/20/2008  . OSTEOARTHRITIS, KNEES, BILATERAL, SEVERE 06/14/2008  . Hyperlipidemia 05/10/2007  . Essential hypertension 01/18/2007  . HYPOKALEMIA 04/30/2006  . HX, PERSONAL, PEPTIC ULCER DISEASE 04/30/2006    Current Outpatient Medications  Medication Sig Dispense Refill  . albuterol (PROVENTIL HFA) 108 (90 Base) MCG/ACT inhaler Inhale 2 puffs into the lungs every 4 (four) hours as needed for wheezing or shortness of breath. 1 Inhaler 4  . albuterol (PROVENTIL) (2.5 MG/3ML) 0.083% nebulizer solution  Take 3 mLs (2.5 mg total) by nebulization every 6 (six) hours as needed for wheezing or shortness of breath. 75 mL 12  . amLODipine (NORVASC) 5 MG tablet amlodipine 5 mg tablet    . aspirin (ECOTRIN LOW STRENGTH) 81 MG EC tablet Take 81 mg by mouth at bedtime.     . budesonide-formoterol (SYMBICORT) 160-4.5 MCG/ACT inhaler one - two inhalations every 12 hours; gargle and spit after use (Patient taking differently: Inhale 2 puffs into the lungs 2 (two) times daily as needed (shortness of breath). gargle and spit after use) 1 Inhaler 4  . carvedilol (COREG) 25 MG tablet TAKE 1 TABLET BY MOUTH TWICE DAILY WITH A MEAL 60 tablet 5  . cefpodoxime (VANTIN) 200 MG tablet cefpodoxime 200 mg tablet    . chlorpheniramine (CHLOR-TRIMETON) 4 MG tablet Take 1 tablet (4 mg total) by mouth every 4 (four) hours as needed for allergies. 60 tablet 1  . chlorpheniramine-HYDROcodone (TUSSIONEX) 10-8 MG/5ML SUER hydrocodone 10 mg-chlorpheniramine 8 mg/5 mL oral susp extend.rel 12hr    . clotrimazole (LOTRIMIN) 1 % external solution Apply 1 application topically 2 (two) times daily. In between toes 60 mL 5  . Cod Liver Oil CAPS Take 1 capsule by mouth daily.     . cyanocobalamin (,VITAMIN B-12,) 1000 MCG/ML injection INJECT 1ML INTO MUSCLE EVERY 30 DAYS 10 mL 1  . diazepam (VALIUM) 5 MG tablet diazepam 5 mg tablet  use if needed for MRI    . diclofenac sodium (VOLTAREN) 1 % GEL Apply 2 g topically 4 (four) times daily. 100 g 11  . docusate sodium (COLACE) 100 MG capsule Take 2-3 capsules (  200-300 mg total) by mouth daily. 90 capsule 11  . doxycycline (VIBRAMYCIN) 100 MG capsule Take 1 capsule (100 mg total) by mouth 2 (two) times daily. One po bid x 7 days 14 capsule 0  . DULoxetine (CYMBALTA) 60 MG capsule Take 1 capsule (60 mg total) by mouth daily. 90 capsule 4  . famotidine (PEPCID) 20 MG tablet Take 1 tablet (20 mg total) by mouth 2 (two) times daily. 180 tablet 3  . Flaxseed, Linseed, (FLAX SEED OIL) 1000 MG CAPS  Take 1,000 mg by mouth 2 (two) times daily.     . fluticasone (FLONASE) 50 MCG/ACT nasal spray Place 2 sprays into both nostrils daily. 16 g 0  . folic acid (FOLVITE) 765 MCG tablet Take 800 mcg by mouth at bedtime.     . furosemide (LASIX) 40 MG tablet Take 3 tablets (120 mg total) by mouth 2 (two) times daily. 360 tablet 5  . hydrOXYzine (ATARAX/VISTARIL) 10 MG tablet Take 1 tablet (10 mg total) by mouth 3 (three) times daily as needed. 30 tablet 0  . Lancets (ONETOUCH ULTRASOFT) lancets Use to help check blood sugars twice a day Dx E11.9 100 each 3  . lidocaine (GLYDO) 2 % jelly Apply 2 grams to affected area, if needed. 30 mL 11  . Multiple Vitamin (MULTIVITAMIN) tablet Take 1 tablet by mouth daily.      Marland Kitchen NEEDLE, DISP, 23 G (EASY TOUCH FLIPLOCK NEEDLES) 23G X 1-1/2" MISC Use to inject B12 once monthly. 50 each 0  . nystatin cream (MYCOSTATIN) Apply 1 application topically 2 (two) times daily. 30 g 2  . Omega-3 Fatty Acids (FISH OIL) 1000 MG CAPS Take 1,000 mg by mouth daily.     . Omeprazole (PRILOSEC PO) Prilosec    . OXcarbazepine (TRILEPTAL) 150 MG tablet Take 1 tablet (150 mg total) by mouth 2 (two) times daily. 60 tablet 11  . oxyCODONE-acetaminophen (PERCOCET) 5-325 MG tablet Take 1 tablet by mouth every 6 (six) hours as needed for severe pain. 10 tablet 0  . potassium chloride SA (K-DUR,KLOR-CON) 20 MEQ tablet TAKE 3 TABLETS BY MOUTH DAILY. 270 tablet 1  . predniSONE (DELTASONE) 10 MG tablet prednisone 10 mg tablet    . pregabalin (LYRICA) 50 MG capsule Lyrica 50 mg capsule    . RA COL-RITE 100 MG capsule take 1 capsule by mouth twice a day if needed for MILD CONSTIPATION 60 capsule 0  . ranitidine (ZANTAC) 150 MG tablet ranitidine 150 mg tablet    . simvastatin (ZOCOR) 40 MG tablet TAKE 1 TABLET BY MOUTH AT BEDTIME 90 tablet 1  . spironolactone (ALDACTONE) 25 MG tablet TAKE 1 TABLET BY MOUTH ONCE DAILY 90 tablet 1  . SYRINGE-NEEDLE, DISP, 3 ML 23G X 1" 3 ML MISC Use with B12 to  inject IM every 30 days 50 each 0  . traMADol (ULTRAM) 50 MG tablet tramadol 50 mg tablet  TAKE 1-2 TABLETS BY MOUTH TWICE DAILY AS NEEDED FOR PAIN    . triamcinolone ointment (KENALOG) 0.5 % Apply 1 application topically 2 (two) times daily. 30 g 0  . verapamil (CALAN) 120 MG tablet verapamil 120 mg tablet  Take by oral route.    Marland Kitchen azithromycin (ZITHROMAX) 250 MG tablet azithromycin 250 mg tablet    . benzonatate (TESSALON) 200 MG capsule benzonatate 200 mg capsule     No current facility-administered medications for this visit.     Allergies: Penicillins; Sulfonamide derivatives; Benazepril hcl; Dipyridamole; Estrogens; Hydrochlorothiazide; Hydrochlorothiazide w-triamterene; Lotensin  [  benazepril hcl]; Metronidazole; Spironolactone; Sulfa antibiotics; Torsemide; Triamterene-hctz; and Valsartan  Past Medical History:  Diagnosis Date  . Anemia   . Asthma   . CAD (coronary artery disease)   . Carpal tunnel syndrome   . Cellulitis of both lower extremities 04/11/2015  . CHF (congestive heart failure) ([place])   . Colon polyp, hyperplastic 2007 & 2012  . Complication of anesthesia 1999   svt with renal calculi surgery, no problems since  . CTS (carpal tunnel syndrome)   . Diabetes mellitus   . Eczema   . Hyperlipidemia   . Hypertension   . Leg ulcer ([place]) 04/24/2015   Right lateral leg No evidence of an infection Monitor closely Keep edema controlled   . Meralgia paresthetica    Dr. [name]  . Morbid obesity ([place])   . Morbid obesity ([place])   . Neuropathy    toes and legs  . Osteoarthrosis, unspecified whether generalized or localized, lower leg   . PUD (peptic ulcer disease)   . Shortness of breath dyspnea    with exertion  . Type II or unspecified type diabetes mellitus without mention of complication, not stated as uncontrolled   . Unspecified hereditary and idiopathic peripheral neuropathy   . Vitamin B12 deficiency   . Wound cellulitis    right upper leg, healing well    Past  Surgical History:  Procedure Laterality Date  . ABDOMINAL HYSTERECTOMY    . CARDIAC CATHETERIZATION  2002   non obstructive disease  . colonoscopy with polypectomy  2007 & 2012    hyperplastic ;Dr Watt Climes  . COLONOSCOPY WITH PROPOFOL N/A 06/04/2015   Procedure: COLONOSCOPY WITH PROPOFOL;  Surgeon: Jerene Bears, MD;  Location: [facility];  Service: Gastroenterology;  Laterality: N/A;  . DILATION AND CURETTAGE OF UTERUS     multiple  . HEMORRHOID SURGERY    . renal calculi  12/1997   SVT with induction of anesthesia  . right knee arthroscopy    . TONSILLECTOMY AND ADENOIDECTOMY      Family History  Problem Relation Age of Onset  . Colon cancer Mother   . Prostate cancer Father   . Colon cancer Father   . Diabetes Maternal Aunt   . Breast cancer Maternal Aunt   . Diabetes Maternal Uncle   . Diabetes Paternal Aunt   . Stroke Paternal Aunt        > 65  . Heart disease Paternal Aunt   . Diabetes Paternal Uncle   . Breast cancer Maternal Aunt         X 2  . Breast cancer Cousin     Social History   Tobacco Use  . Smoking status: Never Smoker  . Smokeless tobacco: Never Used  Substance Use Topics  . Alcohol use: No    Alcohol/week: 0.0 standard drinks    Subjective:   Patient has had problems with chronic wound on her lower right leg- injured the leg again on 02/15/2018 after sustaining a fall in her home. Was taken to ER for further evaluation- due to swelling of wound, not a candidate for sutures; was discharged on Doxycycline and Oxycodone and home health for wound care ordered; Was seen here in follow-up last week for ER follow-up; ACE wrap was added for compression to help the wound heal; home health care orders were continued for weekly care and told to follow-up here in 1 week;  Presents today with her husband; seen in a wheelchair- limited mobility due to wound/ pain;  patient is requesting that home health orders be changed to 3 x per week; Was told she needed to have an  appointment here today for wound check.    Objective:  Vitals:   02/25/18 1456  BP: (!) 142/82  Pulse: 89  Temp: 99.2 F (37.3 C)  TempSrc: Oral  SpO2: 92%  Height: 5\' 5"  (1.651 m)    General: Well developed, well nourished, in no acute distress  Skin : Warm and dry. Large wound noted on lower left extremity- steri-strips in place;  Head: Normocephalic and atraumatic  Eyes: Sclera and conjunctiva clear; pupils round and reactive to light; extraocular movements intact  Ears: External normal; canals clear; tympanic membranes normal  Oropharynx: Pink, supple. No suspicious lesions  Neck: Supple without thyromegaly, adenopathy  Lungs: Respirations unlabored;  Neurologic: Alert and oriented; speech intact; face symmetrical; moves all extremities well; CNII-XII intact without focal deficit   Assessment:  1. Wound of right lower extremity, subsequent encounter     Plan:  Reviewed case with patient's PCP at patient's request; In agreement to continue home health- new orders given for wound care; will refer to wound specialist to see if any other treatment is warranted; extend Doxycycline x 7 more days;  No follow-ups on file.  Orders Placed This Encounter  Procedures  . Ambulatory referral to Orthopedic Surgery    Referral Priority:   Routine    Referral Type:   Surgical    Referral Reason:   Specialty Services Required    Referred to Provider:   [name], MD    Requested Specialty:   Orthopedic Surgery    Number of Visits Requested:   1    Requested Prescriptions   Signed Prescriptions Disp Refills  . doxycycline (VIBRAMYCIN) 100 MG capsule 14 capsule 0    Sig: Take 1 capsule (100 mg total) by mouth 2 (two) times daily. One po bid x 7 days

## 2018-03-01 ENCOUNTER — Ambulatory Visit (INDEPENDENT_AMBULATORY_CARE_PROVIDER_SITE_OTHER): Payer: Medicare Other | Admitting: Orthopedic Surgery

## 2018-03-01 ENCOUNTER — Encounter (HOSPITAL_COMMUNITY): Payer: Self-pay | Admitting: *Deleted

## 2018-03-01 ENCOUNTER — Encounter (INDEPENDENT_AMBULATORY_CARE_PROVIDER_SITE_OTHER): Payer: Self-pay | Admitting: Orthopedic Surgery

## 2018-03-01 ENCOUNTER — Other Ambulatory Visit: Payer: Self-pay

## 2018-03-01 ENCOUNTER — Ambulatory Visit (INDEPENDENT_AMBULATORY_CARE_PROVIDER_SITE_OTHER): Payer: Self-pay | Admitting: Physician Assistant

## 2018-03-01 VITALS — Ht 65.0 in | Wt 389.0 lb

## 2018-03-01 DIAGNOSIS — T8133XS Disruption of traumatic injury wound repair, sequela: Secondary | ICD-10-CM | POA: Diagnosis not present

## 2018-03-01 DIAGNOSIS — L97919 Non-pressure chronic ulcer of unspecified part of right lower leg with unspecified severity: Secondary | ICD-10-CM

## 2018-03-01 DIAGNOSIS — L03115 Cellulitis of right lower limb: Secondary | ICD-10-CM | POA: Diagnosis not present

## 2018-03-01 DIAGNOSIS — I87331 Chronic venous hypertension (idiopathic) with ulcer and inflammation of right lower extremity: Secondary | ICD-10-CM

## 2018-03-01 DIAGNOSIS — Z6841 Body Mass Index (BMI) 40.0 and over, adult: Secondary | ICD-10-CM | POA: Diagnosis not present

## 2018-03-01 NOTE — Progress Notes (Signed)
Anesthesia Chart Review: April [name]   Case:  [id number] Date/Time:  03/02/18 0825   Procedure:  RIGHT LEG DEBRIDEMENT AND PLACE VAC (Right )   Anesthesia type:  General   Pre-op diagnosis:  cellulitis open wound right leg   Location:  [facility] OR ROOM 03 / [place] [facility]   Surgeon:  [name], MD      DISCUSSION: Patient is a 68 year old female scheduled for the above procedure. She is a late add-on for 03/02/18 following evaluation by Dr. [name] on 03/01/18. Patient with known BLE edema, but sustained a traumatic wound to her right leg following a fall on 02/15/18. Since then she developed RLE wound that has not improved with oral antibiotics and local wound care. According to Dr. Jess Barters notes, patient noted to have cellulitis and dehisced traumatic wound of the right leg with also severe venous insufficiency. Urgent operative debridement with wound VAC needed with possible future need for further debridement and split-thickness skin graft. Patient also noted to have a sacral ulcer. BMI was last recorded as 64.73.  Other history includes never smoker, CAD (mild-moderate CAD 2008, medical therapy), chronic diastolic CHF, exertional dyspnea, DM2, peripheral neuropathy, HTN, morbid obesity, anemia, OSA ("mild" 02/2013 per pulmonology notes),asthma.   Patient with multiple comorbities, but in need of urgent debridement of RLE wound. She appears to have regular primary care follow-up, but has not seen her cardiologist Dr. Haroldine Laws since 2015. LVEF and pulmonary pressures normal on 2017 echo. Last A1c showed DM well controlled. I updated anesthesiologist Wallis Bamberg, MD. Anesthesiologist to evaluate on the day of surgery.    PROVIDERS: [name], MD is PCP. Last visit with Dr. Quay Burow 01/14/18. She was treated for RLE cellulitis with doxycycline at that time with known underlying BLE lymphedema.  - Madelon Lips, MD is nephrologist.  - Marcial Pacas, MD is neurologist. - Haroldine Laws, Daniel, MD is  cardiologist, but last visit 02/09/13 for follow-up CAD, HTN, obesity, and diastolic HF. Major issue was severe obesity with weight loss encouraged.  Danton Sewer, MD was pulmonologist. Last seen 04/11/13 for OSA. By notes, she had "mild OSA" and he discussed trial of weight loss over the next six months versus more aggressive treatment with CPAP. She wanted to think about her options.  LABS:  She is for updated labs on the day of surgery. As of 12/14/17, Cr 1.35 with normal CBC and A1c 6.5% on 12/03/17.   IMAGES: CXR 02/15/18: IMPRESSION: No acute cardiopulmonary disease. Stable mild to moderate changes of chronic bronchitis and/or asthma.  DG right tibia/fibula 02/15/18: IMPRESSION: 1. No acute osseous abnormality. 2. Severe osteoarthritis involving the RIGHT knee joint.  EKG: Plan for updated EKG on the day of surgery as her last EKG tracing seen was from 03/28/16 and showed NSR, anterior ST/T wave abnormality, and prolonged QT (QT 496 ms, QTc 538 ms). 04/11/15 EKG prior to that admission showed borderline prolonged QT and showed negative T wave in V1 and V3 but not V2.   CV: Echo 04/13/15: Study Conclusions - Left ventricle: The cavity size was normal. There was mild   concentric hypertrophy. Systolic function was vigorous. The   estimated ejection fraction was in the range of 65% to 70%. Wall   motion was normal; there were no regional wall motion   abnormalities. Doppler parameters are consistent with abnormal   left ventricular relaxation (grade 1 diastolic dysfunction).   Doppler parameters are consistent with elevated ventricular   end-diastolic filling pressure. -  Aortic valve: Trileaflet; normal thickness leaflets. There was no   regurgitation. - Aortic root: The aortic root was normal in size. - Mitral valve: Structurally normal valve. - Left atrium: The atrium was mildly dilated. - Right ventricle: The cavity size was normal. Wall thickness was   normal. Systolic function  was normal. - Right atrium: The atrium was normal in size. - Tricuspid valve: There was mild regurgitation. - Pulmonary arteries: Systolic pressure was within the normal   range. - Inferior vena cava: The vessel was normal in size. - Pericardium, extracardiac: There was no pericardial effusion.  RHC/LHC 03/30/06 (done following 03/25/06 stress test showing anterior and distal inferior ischemia): HEMODYNAMIC RESULTS:   - Right atrial mean of 3, RV pressure 44/4, PA pressure 40/16 with a mean of 0.7.  Pulmonary capillary wedge pressure mean of 10.  Central aortic pressure 191/88 with a mean of 130.  LV pressure 161/11 with EDP of 18.  Fick cardiac output was 9.5 liters per minute with index of 3.6 liters per minute per meter squared.  There was no aortic stenosis or mitral stenosis. - Pulmonary vascular resistance was 1.8 Woods units.  Saturations mid SVC was 72%, right atrial saturation was 69%, RV saturation 70%.  PA saturation 74%.  Aortic saturation 94%.  CORONARY ANATOMY:   - Left main normal. - LAD was a long vessel coursing to the apex, gave off two diagonals.There was a 50-60% stenosis in the LAD just after the first diagonal and a 40% stenosis in the mid LAD just after the second diagonal. - The left circumflex was a moderate-size system.  It gave off a small ramus and a large branching OM-1.  It is angiographically normal. - Right coronary artery was a dominant vessel that gave off a large RV branch. There was a dual PDA system with one small and one moderate-size vessel and too small posterolaterals.  There was a 30% stenosis in the mid RCA and 30% stenosis in the proximal portion of the larger PDA branches. - Left ventriculogram done in the RAO position showed an EF of 70% with no wall motion abnormalities or mitral regurgitation. - Abdominal aortogram showed patent renal arteries bilaterally with no evidence of abdominal aortic aneurysm.  ASSESSMENT: 1. Mild to moderate nonobstructive  coronary artery disease as discussed above. 2. Mild for arterial hypertension. 3. Normal left ventricular function. 4. Normal renal arteries. 5. Severe hypertension. 6. High output state without evidence of intracardiac shunt.  PLAN AND DISCUSSION:  I suspect April [name] symptoms are related to her obesity.  Her blood pressure is also likely playing a role.  I doubt that her coronary artery disease is responsible for her symptoms.  At this point, I would recommend aggressive medical therapy to treat her risk factors as well as weight loss if at all possible. Shaune Pascal. Bensimhon, MD)    Past Medical History:  Diagnosis Date  . Anemia   . Asthma   . CAD (coronary artery disease)   . Carpal tunnel syndrome   . Cellulitis of both lower extremities 04/11/2015  . CHF (congestive heart failure) ([place])   . Colon polyp, hyperplastic 2007 & 2012  . Complication of anesthesia 1999   svt with renal calculi surgery, no problems since  . CTS (carpal tunnel syndrome)   . Diabetes mellitus   . Eczema   . GERD (gastroesophageal reflux disease)   . Hyperlipidemia   . Hypertension   . Leg ulcer ([place]) 04/24/2015   Right lateral  leg No evidence of an infection Monitor closely Keep edema controlled   . Meralgia paresthetica    Dr. [name]  . Morbid obesity ([place])   . Morbid obesity ([place])   . Neuropathy    toes and legs  . Osteoarthrosis, unspecified whether generalized or localized, lower leg   . PUD (peptic ulcer disease)   . Shortness of breath dyspnea    with exertion  . Sleep apnea    per progress note 02/25/2018  . Type II or unspecified type diabetes mellitus without mention of complication, not stated as uncontrolled   . Unspecified hereditary and idiopathic peripheral neuropathy   . Vitamin B12 deficiency   . Wears glasses   . Wound cellulitis    right upper leg, healing well    Past Surgical History:  Procedure Laterality Date  . ABDOMINAL HYSTERECTOMY    . CARDIAC CATHETERIZATION   2002   non obstructive disease  . colonoscopy with polypectomy  2007 & 2012    hyperplastic ;Dr Watt Climes  . COLONOSCOPY WITH PROPOFOL N/A 06/04/2015   Procedure: COLONOSCOPY WITH PROPOFOL;  Surgeon: Jerene Bears, MD;  Location: [facility];  Service: Gastroenterology;  Laterality: N/A;  . DILATION AND CURETTAGE OF UTERUS     multiple  . HEMORRHOID SURGERY    . renal calculi  12/1997   SVT with induction of anesthesia  . right knee arthroscopy    . TONSILLECTOMY AND ADENOIDECTOMY      MEDICATIONS: No current facility-administered medications for this encounter.    Marland Kitchen albuterol (PROVENTIL HFA) 108 (90 Base) MCG/ACT inhaler  . albuterol (PROVENTIL) (2.5 MG/3ML) 0.083% nebulizer solution  . aspirin (ECOTRIN LOW STRENGTH) 81 MG EC tablet  . budesonide-formoterol (SYMBICORT) 160-4.5 MCG/ACT inhaler  . carvedilol (COREG) 25 MG tablet  . Cod Liver Oil CAPS  . cyanocobalamin (,VITAMIN B-12,) 1000 MCG/ML injection  . diclofenac sodium (VOLTAREN) 1 % GEL  . doxycycline (VIBRAMYCIN) 100 MG capsule  . famotidine (PEPCID) 20 MG tablet  . Flaxseed, Linseed, (FLAX SEED OIL) 1000 MG CAPS  . fluocinonide cream (LIDEX) 0.05 %  . folic acid (FOLVITE) 443 MCG tablet  . furosemide (LASIX) 40 MG tablet  . HYDROcodone-acetaminophen (NORCO/VICODIN) 5-325 MG tablet  . hydrOXYzine (ATARAX/VISTARIL) 10 MG tablet  . Multiple Vitamin (MULTIVITAMIN) tablet  . Omega-3 Fatty Acids (FISH OIL) 1000 MG CAPS  . OXcarbazepine (TRILEPTAL) 150 MG tablet  . potassium chloride SA (K-DUR,KLOR-CON) 20 MEQ tablet  . simvastatin (ZOCOR) 40 MG tablet  . spironolactone (ALDACTONE) 25 MG tablet  . triamcinolone cream (KENALOG) 0.1 %  . Lancets (ONETOUCH ULTRASOFT) lancets  . NEEDLE, DISP, 23 G (EASY TOUCH FLIPLOCK NEEDLES) 23G X 1-1/2" MISC  . nystatin cream (MYCOSTATIN)  . oxyCODONE-acetaminophen (PERCOCET) 5-325 MG tablet  . SYRINGE-NEEDLE, DISP, 3 ML 23G X 1" 3 ML MISC    Myra Gianotti, PA-C Surgical Short  Stay/Anesthesiology [facility] Phone [phone] [facility] Phone [phone] 03/01/2018 6:12 PM

## 2018-03-01 NOTE — Progress Notes (Signed)
Office Visit Note   Patient: April [name]           Date of Birth: 1950/05/25           MRN: [id number] Visit Date: 03/01/2018              Requested by: Marrian Salvage, [place] [place], [place] [address] PCP: [name], MD  Chief Complaint  Patient presents with  . Left Leg - Pain  . Right Leg - Pain      HPI: Patient is a 68 year old woman who is seen for initial evaluation for traumatic wound right leg.  Patient states that she fell about 2 weeks ago on the 14th.  She states she struck the kitchen floor had to be evacuated by EMS.  She complains of a sacral wound as well as the wound on the right leg.  She states she is in severe pain complains of redness and warmth.  Assessment & Plan: Visit Diagnoses:  1. Cellulitis of right lower limb   2. Idiopathic chronic venous hypertension of right lower extremity with ulcer and inflammation (HCC)   3. Traumatic wound dehiscence, sequela     Plan: Plan for surgical intervention tomorrow with approximately a 5-day hospital stay.  Patient has cellulitis and a dehisced traumatic wound of the right leg she has severe venous insufficiency with need for urgent debridement of the dehisced traumatic wound placement of a installation wound VAC follow-up with repeat irrigation debridements and possible split-thickness skin graft.  Discussed risks of persistent cellulitis further wound complications potential for amputation.  Follow-Up Instructions: Return in about 1 week (around 03/08/2018).   Ortho Exam  Patient is alert, oriented, no adenopathy, well-dressed, normal affect, normal respiratory effort. Examination patient is in a wheelchair she needs assistance for transfer from chair to standing.  She has a large traumatic wound over the mid aspect of the tibia that is transverse extends essentially 180 degrees around the leg with necrotic tissue in the wound and cellulitis with chronic venous insufficiency.  Patient has  a sacral ulcer that was not evaluated today.  Patient has palpable pulses without arterial insufficiency.  She states she is allergic to penicillin and sulfa medications.  Patient has a BMI of 65 with morbid obesity.  She does not have protein caloric malnutrition.  Imaging: No results found. No images are attached to the encounter.  Labs: Lab Results  Component Value Date   HGBA1C 6.5 12/03/2017   HGBA1C 6.2 06/29/2017   HGBA1C 5.7 12/11/2016   ESRSEDRATE 13 06/02/2016   ESRSEDRATE 24 (H) 01/23/2011   CRP 2.2 06/02/2016   REPTSTATUS 04/02/2016 FINAL 03/28/2016   CULT  03/28/2016    NO GROWTH 5 DAYS Performed at [facility], [place] [address], [place], [place] [address]    LABORGA NO GROWTH 02/19/2015     Lab Results  Component Value Date   ALBUMIN 4.0 12/14/2017   ALBUMIN 3.8 12/03/2017   ALBUMIN 3.9 06/29/2017    Body mass index is 64.73 kg/m.  Orders:  No orders of the defined types were placed in this encounter.  No orders of the defined types were placed in this encounter.    Procedures: No procedures performed  Clinical Data: No additional findings.  ROS:  All other systems negative, except as noted in the HPI. Review of Systems  Objective: Vital Signs: Ht 5\' 5"  (1.651 m)   Wt (!) 389 lb (176.4 kg)   BMI  64.73 kg/m   Specialty Comments:  No specialty comments available.  PMFS History: Patient Active Problem List   Diagnosis Date Noted  . Wound of right leg 02/18/2018  . Blister 02/18/2018  . Cellulitis of right leg 01/14/2018  . Skin lumps 01/14/2018  . Bilateral leg cramps 12/14/2017  . Dry skin dermatitis 06/29/2017  . Left shoulder pain 08/14/2016  . Meralgia paresthetica 07/16/2016  . Chronic left-sided low back pain with left-sided sciatica 06/02/2016  . Venous stasis ulcer ([place]) 05/13/2016  . Whole body pain 03/20/2016  . Diabetic peripheral neuropathy ([place]) 03/20/2016  . Osteopenia 01/11/2016  . CKD (chronic kidney disease)  stage 3, GFR 30-59 ml/min (HCC) 01/02/2016  . Hand paresthesia 07/18/2015  . Carpal tunnel syndrome 06/07/2015  . Family history of colon cancer   . Diabetes mellitus with neurological manifestations ([place]) 04/18/2015  . Bilateral leg edema 04/11/2015  . Cellulitis of leg, right 04/11/2015  . Abnormality of gait 01/03/2015  . Hereditary and idiopathic peripheral neuropathy 01/03/2015  . GERD (gastroesophageal reflux disease) 06/17/2014  . Morbid obesity with BMI of 50.0-59.9, adult ([place]) 09/05/2013  . Hx of colonic polyps 12/15/2012  . Vitamin B12 deficiency 11/03/2012  . Intrinsic asthma 03/23/2012  . Chronic diastolic heart failure ([place]) 02/20/2011  . OSA (obstructive sleep apnea) 09/17/2010  . URINARY URGENCY 01/08/2010  . CAD, NATIVE VESSEL 11/20/2008  . OSTEOARTHRITIS, KNEES, BILATERAL, SEVERE 06/14/2008  . Hyperlipidemia 05/10/2007  . Essential hypertension 01/18/2007  . HYPOKALEMIA 04/30/2006  . HX, PERSONAL, PEPTIC ULCER DISEASE 04/30/2006   Past Medical History:  Diagnosis Date  . Anemia   . Asthma   . CAD (coronary artery disease)   . Carpal tunnel syndrome   . Cellulitis of both lower extremities 04/11/2015  . CHF (congestive heart failure) ([place])   . Colon polyp, hyperplastic 2007 & 2012  . Complication of anesthesia 1999   svt with renal calculi surgery, no problems since  . CTS (carpal tunnel syndrome)   . Diabetes mellitus   . Eczema   . GERD (gastroesophageal reflux disease)   . Hyperlipidemia   . Hypertension   . Leg ulcer ([place]) 04/24/2015   Right lateral leg No evidence of an infection Monitor closely Keep edema controlled   . Meralgia paresthetica    Dr. [name]  . Morbid obesity ([place])   . Morbid obesity ([place])   . Neuropathy    toes and legs  . Osteoarthrosis, unspecified whether generalized or localized, lower leg   . PUD (peptic ulcer disease)   . Shortness of breath dyspnea    with exertion  . Sleep apnea    per progress note 02/25/2018  . Type II or  unspecified type diabetes mellitus without mention of complication, not stated as uncontrolled   . Unspecified hereditary and idiopathic peripheral neuropathy   . Vitamin B12 deficiency   . Wears glasses   . Wound cellulitis    right upper leg, healing well    Family History  Problem Relation Age of Onset  . Colon cancer Mother   . Prostate cancer Father   . Colon cancer Father   . Diabetes Maternal Aunt   . Breast cancer Maternal Aunt   . Diabetes Maternal Uncle   . Diabetes Paternal Aunt   . Stroke Paternal Aunt        > 65  . Heart disease Paternal Aunt   . Diabetes Paternal Uncle   . Breast cancer Maternal Aunt  X 2  . Breast cancer Cousin     Past Surgical History:  Procedure Laterality Date  . ABDOMINAL HYSTERECTOMY    . CARDIAC CATHETERIZATION  2002   non obstructive disease  . colonoscopy with polypectomy  2007 & 2012    hyperplastic ;Dr Watt Climes  . COLONOSCOPY WITH PROPOFOL N/A 06/04/2015   Procedure: COLONOSCOPY WITH PROPOFOL;  Surgeon: Jerene Bears, MD;  Location: [facility];  Service: Gastroenterology;  Laterality: N/A;  . DILATION AND CURETTAGE OF UTERUS     multiple  . HEMORRHOID SURGERY    . renal calculi  12/1997   SVT with induction of anesthesia  . right knee arthroscopy    . TONSILLECTOMY AND ADENOIDECTOMY     Social History   Occupational History  . Occupation: Disabled    Employer: RETIRED  Tobacco Use  . Smoking status: Never Smoker  . Smokeless tobacco: Never Used  Substance and Sexual Activity  . Alcohol use: No    Alcohol/week: 0.0 standard drinks  . Drug use: No  . Sexual activity: Not on file

## 2018-03-01 NOTE — Anesthesia Preprocedure Evaluation (Addendum)
Anesthesia Evaluation  Patient identified by MRN, date of birth, ID band Patient awake    Reviewed: Allergy & Precautions, NPO status , Patient's Chart, lab work & pertinent test results, reviewed documented beta blocker date and time   Airway Mallampati: II  TM Distance: >3 FB Neck ROM: Full    Dental no notable dental hx.    Pulmonary neg pulmonary ROS, asthma , sleep apnea ,    Pulmonary exam normal breath sounds clear to auscultation       Cardiovascular hypertension, Pt. on medications and Pt. on home beta blockers negative cardio ROS Normal cardiovascular exam Rhythm:Regular Rate:Normal     Neuro/Psych negative neurological ROS  negative psych ROS   GI/Hepatic Neg liver ROS, GERD  ,  Endo/Other  diabetes, Type 2Morbid obesity  Renal/GU Renal InsufficiencyRenal disease  negative genitourinary   Musculoskeletal  (+) Arthritis , Osteoarthritis,    Abdominal (+) + obese,   Peds negative pediatric ROS (+)  Hematology negative hematology ROS (+)   Anesthesia Other Findings   Reproductive/Obstetrics negative OB ROS                                                              Anesthesia Evaluation  Patient identified by MRN, date of birth, ID band Patient awake    Reviewed: Allergy & Precautions, NPO status , Patient's Chart, lab work & pertinent test results, reviewed documented beta blocker date and time   Airway Mallampati: IV  TM Distance: >3 FB Neck ROM: Full    Dental  (+) Teeth Intact   Pulmonary asthma , sleep apnea ,    breath sounds clear to auscultation       Cardiovascular hypertension, Pt. on medications and Pt. on home beta blockers + CAD and +CHF   Rhythm:Regular Rate:Normal     Neuro/Psych  Neuromuscular disease negative psych ROS   GI/Hepatic Neg liver ROS, PUD, GERD  Medicated,  Endo/Other  diabetes  Renal/GU negative Renal ROS  negative  genitourinary   Musculoskeletal  (+) Arthritis ,   Abdominal (+) + obese,   Peds negative pediatric ROS (+)  Hematology   Anesthesia Other Findings   Reproductive/Obstetrics negative OB ROS                            Lab Results  Component Value Date   WBC 6.5 03/02/2018   HGB 10.0 (L) 03/02/2018   HCT 33.3 (L) 03/02/2018   MCV 98.2 03/02/2018   PLT 244 03/02/2018   Lab Results  Component Value Date   INR 1.0 07/04/2006   INR 1.0 RATIO 03/26/2006     Anesthesia Physical Anesthesia Plan  ASA: III  Anesthesia Plan: MAC   Post-op Pain Management:    Induction: Intravenous  Airway Management Planned: Simple Face Mask  Additional Equipment:   Intra-op Plan:   Post-operative Plan:   Informed Consent: I have reviewed the patients History and Physical, chart, labs and discussed the procedure including the risks, benefits and alternatives for the proposed anesthesia with the patient or authorized representative who has indicated his/her understanding and acceptance.     Plan Discussed with: CRNA  Anesthesia Plan Comments:        Anesthesia Quick Evaluation  Anesthesia Physical  Anesthesia Plan  ASA: III  Anesthesia Plan: General   Post-op Pain Management:    Induction: Intravenous  PONV Risk Score and Plan: 3 and Ondansetron and Dexamethasone  Airway Management Planned: LMA  Additional Equipment:   Intra-op Plan:   Post-operative Plan: Extubation in OR  Informed Consent: I have reviewed the patients History and Physical, chart, labs and discussed the procedure including the risks, benefits and alternatives for the proposed anesthesia with the patient or authorized representative who has indicated his/her understanding and acceptance.     Dental advisory given  Plan Discussed with: CRNA  Anesthesia Plan Comments: (PAT note written 03/01/2018 by Myra Gianotti, PA-C. )       Anesthesia Quick Evaluation

## 2018-03-01 NOTE — Progress Notes (Signed)
Pt denies any acute cardiopulmonary issues. Pt under the care of Dr. Haroldine Laws, Cardiology. Pt denies having an EKG within the last year. Pt denies recent labs. Pt made aware to stop taking vitamins, fish oil, Cod Liver Oil, Flaxseed and herbal medications. Do not take any NSAIDs ie: Ibuprofen, Advil, Naproxen (A:Leve), Motrin, BC and Goody Powder and Voltaren Gel. Pt tsated that she does not take diabetes medication. Pt made aware to check BG every 2 hours prior to arrival to hospital on DOS. Pt made aware to treat a BG < 70 with  4 ounces of apple or cranberry juice, wait 15 minutes after intervention to recheck BG, if BG remains < 70, call Short Stay unit to speak with a nurse. Pt verbalized understanding of all pre-op instructions. PA, Anesthesiology, asked to review pt history.

## 2018-03-02 ENCOUNTER — Inpatient Hospital Stay (HOSPITAL_COMMUNITY): Payer: Medicare Other | Admitting: Vascular Surgery

## 2018-03-02 ENCOUNTER — Encounter (HOSPITAL_COMMUNITY): Payer: Self-pay

## 2018-03-02 ENCOUNTER — Inpatient Hospital Stay (HOSPITAL_COMMUNITY)
Admission: RE | Admit: 2018-03-02 | Discharge: 2018-03-09 | DRG: 574 | Disposition: A | Discharge status: Skilled Nursing Facility | Payer: Medicare Other | Attending: Orthopedic Surgery | Admitting: Orthopedic Surgery

## 2018-03-02 ENCOUNTER — Encounter (HOSPITAL_COMMUNITY)
Admission: RE | Disposition: A | Discharge status: Skilled Nursing Facility | Payer: Self-pay | Source: Home / Self Care | Attending: Orthopedic Surgery

## 2018-03-02 DIAGNOSIS — G4733 Obstructive sleep apnea (adult) (pediatric): Secondary | ICD-10-CM | POA: Diagnosis present

## 2018-03-02 DIAGNOSIS — Z880 Allergy status to penicillin: Secondary | ICD-10-CM | POA: Diagnosis not present

## 2018-03-02 DIAGNOSIS — Z6841 Body Mass Index (BMI) 40.0 and over, adult: Secondary | ICD-10-CM

## 2018-03-02 DIAGNOSIS — Z79899 Other long term (current) drug therapy: Secondary | ICD-10-CM

## 2018-03-02 DIAGNOSIS — E785 Hyperlipidemia, unspecified: Secondary | ICD-10-CM | POA: Diagnosis present

## 2018-03-02 DIAGNOSIS — Z79891 Long term (current) use of opiate analgesic: Secondary | ICD-10-CM

## 2018-03-02 DIAGNOSIS — I878 Other specified disorders of veins: Secondary | ICD-10-CM | POA: Diagnosis present

## 2018-03-02 DIAGNOSIS — I87309 Chronic venous hypertension (idiopathic) without complications of unspecified lower extremity: Secondary | ICD-10-CM | POA: Diagnosis present

## 2018-03-02 DIAGNOSIS — W19XXXA Unspecified fall, initial encounter: Secondary | ICD-10-CM | POA: Diagnosis present

## 2018-03-02 DIAGNOSIS — R509 Fever, unspecified: Secondary | ICD-10-CM | POA: Diagnosis not present

## 2018-03-02 DIAGNOSIS — I872 Venous insufficiency (chronic) (peripheral): Secondary | ICD-10-CM | POA: Diagnosis present

## 2018-03-02 DIAGNOSIS — Z882 Allergy status to sulfonamides status: Secondary | ICD-10-CM

## 2018-03-02 DIAGNOSIS — K219 Gastro-esophageal reflux disease without esophagitis: Secondary | ICD-10-CM | POA: Diagnosis present

## 2018-03-02 DIAGNOSIS — Z888 Allergy status to other drugs, medicaments and biological substances status: Secondary | ICD-10-CM | POA: Diagnosis not present

## 2018-03-02 DIAGNOSIS — Z800 Family history of malignant neoplasm of digestive organs: Secondary | ICD-10-CM | POA: Diagnosis not present

## 2018-03-02 DIAGNOSIS — Z8711 Personal history of peptic ulcer disease: Secondary | ICD-10-CM | POA: Diagnosis not present

## 2018-03-02 DIAGNOSIS — L97919 Non-pressure chronic ulcer of unspecified part of right lower leg with unspecified severity: Secondary | ICD-10-CM | POA: Diagnosis present

## 2018-03-02 DIAGNOSIS — I2510 Atherosclerotic heart disease of native coronary artery without angina pectoris: Secondary | ICD-10-CM | POA: Diagnosis present

## 2018-03-02 DIAGNOSIS — I110 Hypertensive heart disease with heart failure: Secondary | ICD-10-CM | POA: Diagnosis present

## 2018-03-02 DIAGNOSIS — T8133XA Disruption of traumatic injury wound repair, initial encounter: Secondary | ICD-10-CM | POA: Diagnosis present

## 2018-03-02 DIAGNOSIS — Z7951 Long term (current) use of inhaled steroids: Secondary | ICD-10-CM

## 2018-03-02 DIAGNOSIS — Z881 Allergy status to other antibiotic agents status: Secondary | ICD-10-CM | POA: Diagnosis not present

## 2018-03-02 DIAGNOSIS — Z86010 Personal history of colonic polyps: Secondary | ICD-10-CM | POA: Diagnosis not present

## 2018-03-02 DIAGNOSIS — Z8249 Family history of ischemic heart disease and other diseases of the circulatory system: Secondary | ICD-10-CM

## 2018-03-02 DIAGNOSIS — L03115 Cellulitis of right lower limb: Secondary | ICD-10-CM | POA: Diagnosis present

## 2018-03-02 DIAGNOSIS — Z7982 Long term (current) use of aspirin: Secondary | ICD-10-CM

## 2018-03-02 DIAGNOSIS — Z87442 Personal history of urinary calculi: Secondary | ICD-10-CM | POA: Diagnosis not present

## 2018-03-02 DIAGNOSIS — Z8042 Family history of malignant neoplasm of prostate: Secondary | ICD-10-CM

## 2018-03-02 DIAGNOSIS — Z823 Family history of stroke: Secondary | ICD-10-CM

## 2018-03-02 DIAGNOSIS — T8133XS Disruption of traumatic injury wound repair, sequela: Secondary | ICD-10-CM | POA: Diagnosis not present

## 2018-03-02 DIAGNOSIS — T8133XD Disruption of traumatic injury wound repair, subsequent encounter: Secondary | ICD-10-CM | POA: Diagnosis not present

## 2018-03-02 DIAGNOSIS — R5082 Postprocedural fever: Secondary | ICD-10-CM | POA: Diagnosis not present

## 2018-03-02 DIAGNOSIS — L89159 Pressure ulcer of sacral region, unspecified stage: Secondary | ICD-10-CM | POA: Diagnosis present

## 2018-03-02 DIAGNOSIS — E538 Deficiency of other specified B group vitamins: Secondary | ICD-10-CM | POA: Diagnosis present

## 2018-03-02 DIAGNOSIS — Z872 Personal history of diseases of the skin and subcutaneous tissue: Secondary | ICD-10-CM | POA: Diagnosis not present

## 2018-03-02 DIAGNOSIS — I890 Lymphedema, not elsewhere classified: Secondary | ICD-10-CM | POA: Diagnosis not present

## 2018-03-02 DIAGNOSIS — Z833 Family history of diabetes mellitus: Secondary | ICD-10-CM | POA: Diagnosis not present

## 2018-03-02 DIAGNOSIS — Z90710 Acquired absence of both cervix and uterus: Secondary | ICD-10-CM

## 2018-03-02 DIAGNOSIS — J9811 Atelectasis: Secondary | ICD-10-CM | POA: Diagnosis not present

## 2018-03-02 DIAGNOSIS — E1142 Type 2 diabetes mellitus with diabetic polyneuropathy: Secondary | ICD-10-CM | POA: Diagnosis present

## 2018-03-02 DIAGNOSIS — R8271 Bacteriuria: Secondary | ICD-10-CM | POA: Diagnosis not present

## 2018-03-02 DIAGNOSIS — L02415 Cutaneous abscess of right lower limb: Secondary | ICD-10-CM | POA: Diagnosis present

## 2018-03-02 DIAGNOSIS — I5032 Chronic diastolic (congestive) heart failure: Secondary | ICD-10-CM | POA: Diagnosis present

## 2018-03-02 DIAGNOSIS — Z9889 Other specified postprocedural states: Secondary | ICD-10-CM | POA: Diagnosis not present

## 2018-03-02 DIAGNOSIS — Z803 Family history of malignant neoplasm of breast: Secondary | ICD-10-CM

## 2018-03-02 HISTORY — DX: Gastro-esophageal reflux disease without esophagitis: K21.9

## 2018-03-02 HISTORY — PX: I & D EXTREMITY: SHX5045

## 2018-03-02 HISTORY — DX: Sleep apnea, unspecified: G47.30

## 2018-03-02 HISTORY — DX: Presence of spectacles and contact lenses: Z97.3

## 2018-03-02 HISTORY — PX: DEBRIDEMENT LEG: SUR390

## 2018-03-02 LAB — CBC
HCT: 33.3 % — ABNORMAL LOW (ref 36.0–46.0)
Hemoglobin: 10 g/dL — ABNORMAL LOW (ref 12.0–15.0)
MCH: 29.5 pg (ref 26.0–34.0)
MCHC: 30 g/dL (ref 30.0–36.0)
MCV: 98.2 fL (ref 80.0–100.0)
Platelets: 244 10*3/uL (ref 150–400)
RBC: 3.39 MIL/uL — ABNORMAL LOW (ref 3.87–5.11)
RDW: 15.3 % (ref 11.5–15.5)
WBC: 6.5 10*3/uL (ref 4.0–10.5)
nRBC: 0 % (ref 0.0–0.2)

## 2018-03-02 LAB — BASIC METABOLIC PANEL
Anion gap: 12 (ref 5–15)
BUN: 26 mg/dL — ABNORMAL HIGH (ref 8–23)
CO2: 28 mmol/L (ref 22–32)
Calcium: 8.7 mg/dL — ABNORMAL LOW (ref 8.9–10.3)
Chloride: 98 mmol/L (ref 98–111)
Creatinine, Ser: 1.47 mg/dL — ABNORMAL HIGH (ref 0.44–1.00)
GFR calc Af Amer: 42 mL/min — ABNORMAL LOW (ref >60)
GFR calc non Af Amer: 37 mL/min — ABNORMAL LOW (ref >60)
GLUCOSE: 112 mg/dL — AB (ref 70–99)
Potassium: 4.1 mmol/L (ref 3.5–5.1)
Sodium: 138 mmol/L (ref 135–145)

## 2018-03-02 LAB — GLUCOSE, CAPILLARY
GLUCOSE-CAPILLARY: 166 mg/dL — AB (ref 70–99)
Glucose-Capillary: 96 mg/dL (ref 70–99)
Glucose-Capillary: 107 mg/dL — ABNORMAL HIGH (ref 70–99)
Glucose-Capillary: 138 mg/dL — ABNORMAL HIGH (ref 70–99)
Glucose-Capillary: 128 mg/dL — ABNORMAL HIGH (ref 70–99)

## 2018-03-02 SURGERY — IRRIGATION AND DEBRIDEMENT EXTREMITY
Anesthesia: General | Laterality: Right

## 2018-03-02 MED ORDER — HYDROMORPHONE HCL 1 MG/ML IJ SOLN
0.5000 mg | INTRAMUSCULAR | Status: DC | PRN
  Administered 2018-03-02 – 2018-03-06 (×7): 1 mg via INTRAVENOUS
  Filled 2018-03-02 (×7): qty 1

## 2018-03-02 MED ORDER — PROMETHAZINE HCL 25 MG/ML IJ SOLN: 6.2500 mg | INTRAMUSCULAR | Status: DC | PRN

## 2018-03-02 MED ORDER — MIDAZOLAM HCL 2 MG/2ML IJ SOLN
INTRAMUSCULAR | Status: AC
  Filled 2018-03-02: qty 2

## 2018-03-02 MED ORDER — ALBUTEROL SULFATE (2.5 MG/3ML) 0.083% IN NEBU
3.0000 mL | INHALATION_SOLUTION | RESPIRATORY_TRACT | Status: DC | PRN
  Administered 2018-03-08 (×2): 3 mL via RESPIRATORY_TRACT
  Filled 2018-03-02 (×2): qty 3

## 2018-03-02 MED ORDER — BISACODYL 10 MG RE SUPP: 10.0000 mg | Freq: Every day | RECTAL | Status: DC | PRN

## 2018-03-02 MED ORDER — INSULIN ASPART 100 UNIT/ML ~~LOC~~ SOLN
0.0000 [IU] | Freq: Three times a day (TID) | SUBCUTANEOUS | Status: DC
  Administered 2018-03-02 – 2018-03-09 (×9): 3 [IU] via SUBCUTANEOUS

## 2018-03-02 MED ORDER — OXYCODONE HCL 5 MG PO TABS
5.0000 mg | ORAL_TABLET | ORAL | Status: DC | PRN
  Administered 2018-03-03 (×3): 10 mg via ORAL
  Filled 2018-03-02 (×4): qty 2

## 2018-03-02 MED ORDER — ACETAMINOPHEN 325 MG PO TABS
325.0000 mg | ORAL_TABLET | Freq: Four times a day (QID) | ORAL | Status: DC | PRN
  Administered 2018-03-06 – 2018-03-08 (×4): 650 mg via ORAL
  Filled 2018-03-02 (×4): qty 2

## 2018-03-02 MED ORDER — SIMVASTATIN 20 MG PO TABS
40.0000 mg | ORAL_TABLET | Freq: Every day | ORAL | Status: DC
  Administered 2018-03-02 – 2018-03-08 (×7): 40 mg via ORAL
  Filled 2018-03-02 (×7): qty 2

## 2018-03-02 MED ORDER — SPIRONOLACTONE 25 MG PO TABS
25.0000 mg | ORAL_TABLET | Freq: Every day | ORAL | Status: DC
  Administered 2018-03-02 – 2018-03-09 (×7): 25 mg via ORAL
  Filled 2018-03-02 (×7): qty 1

## 2018-03-02 MED ORDER — CARVEDILOL 25 MG PO TABS
25.0000 mg | ORAL_TABLET | Freq: Two times a day (BID) | ORAL | Status: DC
  Administered 2018-03-02 – 2018-03-09 (×13): 25 mg via ORAL
  Filled 2018-03-02 (×14): qty 1

## 2018-03-02 MED ORDER — FENTANYL CITRATE (PF) 100 MCG/2ML IJ SOLN
INTRAMUSCULAR | Status: DC | PRN
  Administered 2018-03-02 (×2): 50 ug via INTRAVENOUS
  Administered 2018-03-02: 75 ug via INTRAVENOUS
  Administered 2018-03-02: 25 ug via INTRAVENOUS

## 2018-03-02 MED ORDER — ONDANSETRON HCL 4 MG/2ML IJ SOLN
INTRAMUSCULAR | Status: AC
  Filled 2018-03-02: qty 2

## 2018-03-02 MED ORDER — OXYCODONE HCL 5 MG/5ML PO SOLN: 5.0000 mg | Freq: Once | ORAL | Status: DC | PRN

## 2018-03-02 MED ORDER — HYDROMORPHONE HCL 1 MG/ML IJ SOLN
0.2500 mg | INTRAMUSCULAR | Status: DC | PRN
  Administered 2018-03-02 (×2): 0.5 mg via INTRAVENOUS

## 2018-03-02 MED ORDER — HYDROCODONE-ACETAMINOPHEN 5-325 MG PO TABS
1.0000 | ORAL_TABLET | Freq: Four times a day (QID) | ORAL | Status: DC | PRN
  Administered 2018-03-03 – 2018-03-09 (×6): 1 via ORAL
  Filled 2018-03-02 (×7): qty 1

## 2018-03-02 MED ORDER — CLINDAMYCIN PHOSPHATE 900 MG/50ML IV SOLN
900.0000 mg | INTRAVENOUS | Status: AC
  Administered 2018-03-02: 900 mg via INTRAVENOUS
  Filled 2018-03-02: qty 50

## 2018-03-02 MED ORDER — POLYETHYLENE GLYCOL 3350 17 G PO PACK: 17.0000 g | PACK | Freq: Every day | ORAL | Status: DC | PRN

## 2018-03-02 MED ORDER — LACTATED RINGERS IV SOLN
INTRAVENOUS | Status: DC
  Administered 2018-03-02: 09:00:00 via INTRAVENOUS

## 2018-03-02 MED ORDER — OXCARBAZEPINE 150 MG PO TABS
150.0000 mg | ORAL_TABLET | Freq: Two times a day (BID) | ORAL | Status: DC
  Administered 2018-03-02 – 2018-03-09 (×14): 150 mg via ORAL
  Filled 2018-03-02 (×15): qty 1

## 2018-03-02 MED ORDER — PHENYLEPHRINE 40 MCG/ML (10ML) SYRINGE FOR IV PUSH (FOR BLOOD PRESSURE SUPPORT)
PREFILLED_SYRINGE | INTRAVENOUS | Status: DC | PRN
  Administered 2018-03-02: 120 ug via INTRAVENOUS
  Administered 2018-03-02: 200 ug via INTRAVENOUS

## 2018-03-02 MED ORDER — MIDAZOLAM HCL 2 MG/2ML IJ SOLN
INTRAMUSCULAR | Status: DC | PRN
  Administered 2018-03-02: 2 mg via INTRAVENOUS

## 2018-03-02 MED ORDER — HYDROMORPHONE HCL 1 MG/ML IJ SOLN
INTRAMUSCULAR | Status: AC
  Administered 2018-03-02: 0.5 mg via INTRAVENOUS
  Filled 2018-03-02: qty 1

## 2018-03-02 MED ORDER — CHLORHEXIDINE GLUCONATE 4 % EX LIQD: 60.0000 mL | Freq: Once | CUTANEOUS | Status: DC

## 2018-03-02 MED ORDER — FAMOTIDINE 20 MG PO TABS
20.0000 mg | ORAL_TABLET | Freq: Two times a day (BID) | ORAL | Status: DC
  Administered 2018-03-02 – 2018-03-09 (×14): 20 mg via ORAL
  Filled 2018-03-02 (×14): qty 1

## 2018-03-02 MED ORDER — METHOCARBAMOL 1000 MG/10ML IJ SOLN
500.0000 mg | Freq: Four times a day (QID) | INTRAVENOUS | Status: DC | PRN
  Filled 2018-03-02: qty 5

## 2018-03-02 MED ORDER — HYDROXYZINE HCL 10 MG PO TABS
10.0000 mg | ORAL_TABLET | Freq: Three times a day (TID) | ORAL | Status: DC | PRN
  Administered 2018-03-05 – 2018-03-07 (×2): 10 mg via ORAL
  Filled 2018-03-02 (×5): qty 1

## 2018-03-02 MED ORDER — OXYCODONE HCL 5 MG PO TABS: 5.0000 mg | ORAL_TABLET | Freq: Once | ORAL | Status: DC | PRN

## 2018-03-02 MED ORDER — POTASSIUM CHLORIDE CRYS ER 20 MEQ PO TBCR
60.0000 meq | EXTENDED_RELEASE_TABLET | Freq: Every day | ORAL | Status: DC
  Administered 2018-03-02 – 2018-03-08 (×7): 60 meq via ORAL
  Filled 2018-03-02 (×7): qty 3

## 2018-03-02 MED ORDER — METOCLOPRAMIDE HCL 5 MG PO TABS: 5.0000 mg | ORAL_TABLET | Freq: Three times a day (TID) | ORAL | Status: DC | PRN

## 2018-03-02 MED ORDER — DOCUSATE SODIUM 100 MG PO CAPS
100.0000 mg | ORAL_CAPSULE | Freq: Two times a day (BID) | ORAL | Status: DC
  Administered 2018-03-02 – 2018-03-09 (×14): 100 mg via ORAL
  Filled 2018-03-02 (×14): qty 1

## 2018-03-02 MED ORDER — ONDANSETRON HCL 4 MG PO TABS: 4.0000 mg | ORAL_TABLET | Freq: Four times a day (QID) | ORAL | Status: DC | PRN

## 2018-03-02 MED ORDER — FENTANYL CITRATE (PF) 250 MCG/5ML IJ SOLN
INTRAMUSCULAR | Status: AC
  Filled 2018-03-02: qty 5

## 2018-03-02 MED ORDER — PROPOFOL 10 MG/ML IV BOLUS
INTRAVENOUS | Status: AC
  Filled 2018-03-02: qty 40

## 2018-03-02 MED ORDER — MOMETASONE FURO-FORMOTEROL FUM 200-5 MCG/ACT IN AERO
2.0000 | INHALATION_SPRAY | Freq: Two times a day (BID) | RESPIRATORY_TRACT | Status: DC
  Administered 2018-03-02 – 2018-03-09 (×14): 2 via RESPIRATORY_TRACT
  Filled 2018-03-02: qty 8.8

## 2018-03-02 MED ORDER — METOCLOPRAMIDE HCL 5 MG/ML IJ SOLN: 5.0000 mg | Freq: Three times a day (TID) | INTRAMUSCULAR | Status: DC | PRN

## 2018-03-02 MED ORDER — FUROSEMIDE 40 MG PO TABS
120.0000 mg | ORAL_TABLET | Freq: Two times a day (BID) | ORAL | Status: DC
  Administered 2018-03-02 – 2018-03-09 (×13): 120 mg via ORAL
  Filled 2018-03-02 (×13): qty 3

## 2018-03-02 MED ORDER — LIDOCAINE 2% (20 MG/ML) 5 ML SYRINGE
INTRAMUSCULAR | Status: DC | PRN
  Administered 2018-03-02: 100 mg via INTRAVENOUS

## 2018-03-02 MED ORDER — MAGNESIUM CITRATE PO SOLN
1.0000 | Freq: Once | ORAL | Status: AC | PRN
  Administered 2018-03-06: 1 via ORAL
  Filled 2018-03-02: qty 296

## 2018-03-02 MED ORDER — PROPOFOL 10 MG/ML IV BOLUS
INTRAVENOUS | Status: DC | PRN
  Administered 2018-03-02: 200 mg via INTRAVENOUS

## 2018-03-02 MED ORDER — ONDANSETRON HCL 4 MG/2ML IJ SOLN
INTRAMUSCULAR | Status: DC | PRN
  Administered 2018-03-02: 4 mg via INTRAVENOUS

## 2018-03-02 MED ORDER — METHOCARBAMOL 500 MG PO TABS
500.0000 mg | ORAL_TABLET | Freq: Four times a day (QID) | ORAL | Status: DC | PRN
  Administered 2018-03-02: 500 mg via ORAL
  Filled 2018-03-02: qty 1

## 2018-03-02 MED ORDER — SODIUM CHLORIDE 0.9 % IV SOLN
INTRAVENOUS | Status: DC
  Administered 2018-03-02: 17:00:00 via INTRAVENOUS

## 2018-03-02 MED ORDER — CLINDAMYCIN PHOSPHATE 600 MG/50ML IV SOLN
600.0000 mg | Freq: Three times a day (TID) | INTRAVENOUS | Status: AC
  Administered 2018-03-02 – 2018-03-07 (×14): 600 mg via INTRAVENOUS
  Filled 2018-03-02 (×15): qty 50

## 2018-03-02 MED ORDER — DEXAMETHASONE SODIUM PHOSPHATE 10 MG/ML IJ SOLN
INTRAMUSCULAR | Status: DC | PRN
  Administered 2018-03-02: 10 mg via INTRAVENOUS

## 2018-03-02 MED ORDER — ALBUTEROL SULFATE (2.5 MG/3ML) 0.083% IN NEBU: 2.5000 mg | INHALATION_SOLUTION | Freq: Four times a day (QID) | RESPIRATORY_TRACT | Status: DC | PRN

## 2018-03-02 MED ORDER — OXYCODONE HCL 5 MG PO TABS
10.0000 mg | ORAL_TABLET | ORAL | Status: DC | PRN
  Administered 2018-03-02 – 2018-03-03 (×2): 10 mg via ORAL
  Administered 2018-03-04 (×2): 15 mg via ORAL
  Filled 2018-03-02: qty 3
  Filled 2018-03-02: qty 2
  Filled 2018-03-02: qty 3

## 2018-03-02 MED ORDER — ONDANSETRON HCL 4 MG/2ML IJ SOLN
4.0000 mg | Freq: Four times a day (QID) | INTRAMUSCULAR | Status: DC | PRN
  Administered 2018-03-02 – 2018-03-06 (×3): 4 mg via INTRAVENOUS
  Filled 2018-03-02 (×3): qty 2

## 2018-03-02 MED ORDER — MOMETASONE FURO-FORMOTEROL FUM 200-5 MCG/ACT IN AERO: 2.0000 | INHALATION_SPRAY | Freq: Two times a day (BID) | RESPIRATORY_TRACT | Status: DC

## 2018-03-02 MED ORDER — HYDROMORPHONE HCL 1 MG/ML IJ SOLN
0.2500 mg | INTRAMUSCULAR | Status: DC | PRN
  Administered 2018-03-02 (×4): 0.5 mg via INTRAVENOUS

## 2018-03-02 MED ORDER — ASPIRIN EC 81 MG PO TBEC
81.0000 mg | DELAYED_RELEASE_TABLET | Freq: Every day | ORAL | Status: DC
  Administered 2018-03-02 – 2018-03-08 (×7): 81 mg via ORAL
  Filled 2018-03-02 (×7): qty 1

## 2018-03-02 MED ORDER — 0.9 % SODIUM CHLORIDE (POUR BTL) OPTIME
TOPICAL | Status: DC | PRN
  Administered 2018-03-02: 1000 mL

## 2018-03-02 SURGICAL SUPPLY — 37 items
BLADE SURG 21 STRL SS (BLADE) ×3 IMPLANT
BNDG COHESIVE 6X5 TAN STRL LF (GAUZE/BANDAGES/DRESSINGS) IMPLANT
BNDG GAUZE ELAST 4 BULKY (GAUZE/BANDAGES/DRESSINGS) ×4 IMPLANT
CANISTER WOUNDNEG PRESSURE 500 (CANNISTER) ×2 IMPLANT
CASSETTE VERAFLO VERALINK (MISCELLANEOUS) ×2 IMPLANT
COVER SURGICAL LIGHT HANDLE (MISCELLANEOUS) ×4 IMPLANT
COVER WAND RF STERILE (DRAPES) ×3 IMPLANT
DRAPE U-SHAPE 47X51 STRL (DRAPES) ×3 IMPLANT
DRESSING VERAFLO CLEANSE CC (GAUZE/BANDAGES/DRESSINGS) IMPLANT
DRSG ADAPTIC 3X8 NADH LF (GAUZE/BANDAGES/DRESSINGS) ×1 IMPLANT
DRSG VERAFLO CLEANSE CC (GAUZE/BANDAGES/DRESSINGS) ×3
DURAPREP 26ML APPLICATOR (WOUND CARE) ×5 IMPLANT
ELECT REM PT RETURN 9FT ADLT (ELECTROSURGICAL)
ELECTRODE REM PT RTRN 9FT ADLT (ELECTROSURGICAL) IMPLANT
GAUZE SPONGE 4X4 12PLY STRL (GAUZE/BANDAGES/DRESSINGS) ×1 IMPLANT
GLOVE BIOGEL PI IND STRL 9 (GLOVE) ×1 IMPLANT
GLOVE BIOGEL PI INDICATOR 9 (GLOVE) ×2
GLOVE SURG ORTHO 9.0 STRL STRW (GLOVE) ×5 IMPLANT
GOWN STRL REUS W/ TWL XL LVL3 (GOWN DISPOSABLE) ×2 IMPLANT
GOWN STRL REUS W/TWL XL LVL3 (GOWN DISPOSABLE) ×6
HANDPIECE INTERPULSE COAX TIP (DISPOSABLE)
KIT BASIN OR (CUSTOM PROCEDURE TRAY) ×3 IMPLANT
KIT TURNOVER KIT B (KITS) ×3 IMPLANT
MANIFOLD NEPTUNE II (INSTRUMENTS) ×3 IMPLANT
NS IRRIG 1000ML POUR BTL (IV SOLUTION) ×3 IMPLANT
PACK ORTHO EXTREMITY (CUSTOM PROCEDURE TRAY) ×3 IMPLANT
PAD ARMBOARD 7.5X6 YLW CONV (MISCELLANEOUS) ×2 IMPLANT
SET HNDPC FAN SPRY TIP SCT (DISPOSABLE) IMPLANT
STOCKINETTE IMPERVIOUS 9X36 MD (GAUZE/BANDAGES/DRESSINGS) IMPLANT
SUT ETHILON 2 0 PSLX (SUTURE) ×8 IMPLANT
SWAB COLLECTION DEVICE MRSA (MISCELLANEOUS) ×3 IMPLANT
SWAB CULTURE ESWAB REG 1ML (MISCELLANEOUS) IMPLANT
TOWEL OR 17X26 10 PK STRL BLUE (TOWEL DISPOSABLE) ×3 IMPLANT
TUBE CONNECTING 12'X1/4 (SUCTIONS) ×1
TUBE CONNECTING 12X1/4 (SUCTIONS) ×2 IMPLANT
TUBING VERAFLO DUO SET (SET/KITS/TRAYS/PACK) ×2 IMPLANT
YANKAUER SUCT BULB TIP NO VENT (SUCTIONS) ×3 IMPLANT

## 2018-03-02 NOTE — Anesthesia Postprocedure Evaluation (Signed)
Anesthesia Post Note  Patient: April [name]  Procedure(s) Performed: RIGHT LEG DEBRIDEMENT AND PLACE VAC (Right )     Patient location during evaluation: PACU Anesthesia Type: General Level of consciousness: awake and alert Pain management: pain level controlled Vital Signs Assessment: post-procedure vital signs reviewed and stable Respiratory status: spontaneous breathing, nonlabored ventilation and respiratory function stable Cardiovascular status: blood pressure returned to baseline and stable Postop Assessment: no apparent nausea or vomiting Anesthetic complications: no    Last Vitals:  Vitals:   03/02/18 1118 03/02/18 1130  BP:  121/69  Pulse: 64 60  Resp: (!) 8 (!) 7  Temp:    SpO2: 90% 98%    Last Pain:  Vitals:   03/02/18 1030  TempSrc:   PainSc: Asleep                 Lynda Rainwater

## 2018-03-02 NOTE — Plan of Care (Signed)
  Problem: Activity: Goal: Risk for activity intolerance will decrease Outcome: Progressing   Problem: Coping: Goal: Level of anxiety will decrease Outcome: Progressing   Problem: Elimination: Goal: Will not experience complications related to bowel motility Outcome: Progressing   Problem: Pain Managment: Goal: General experience of comfort will improve Outcome: Progressing   Problem: Safety: Goal: Ability to remain free from injury will improve Outcome: Progressing   Problem: Skin Integrity: Goal: Risk for impaired skin integrity will decrease Outcome: Progressing   

## 2018-03-02 NOTE — Anesthesia Procedure Notes (Signed)
Procedure Name: LMA Insertion Date/Time: 03/02/2018 8:44 AM Performed by: Leonor Liv, CRNA Pre-anesthesia Checklist: Patient identified, Emergency Drugs available, Suction available and Patient being monitored Patient Re-evaluated:Patient Re-evaluated prior to induction Oxygen Delivery Method: Circle System Utilized Preoxygenation: Pre-oxygenation with 100% oxygen Induction Type: IV induction Ventilation: Mask ventilation without difficulty LMA: LMA inserted LMA Size: 4.0 Number of attempts: 1 Placement Confirmation: positive ETCO2 Tube secured with: Tape Dental Injury: Teeth and Oropharynx as per pre-operative assessment

## 2018-03-02 NOTE — H&P (Signed)
April [name] is an 68 y.o. female.   Chief Complaint: Traumatic wound right leg with pain and cellulitis HPI: Patient is a 68 year old woman who is seen for initial evaluation for traumatic wound right leg.  Patient states that she fell about 2 weeks ago on the 14th.  She states she struck the kitchen floor had to be evacuated by EMS.  She complains of a sacral wound as well as the wound on the right leg.  She states she is in severe pain complains of redness and warmth.   Past Medical History:  Diagnosis Date  . Anemia   . Asthma   . CAD (coronary artery disease)   . Carpal tunnel syndrome   . Cellulitis of both lower extremities 04/11/2015  . CHF (congestive heart failure) ([place])   . Colon polyp, hyperplastic 2007 & 2012  . Complication of anesthesia 1999   svt with renal calculi surgery, no problems since  . CTS (carpal tunnel syndrome)   . Diabetes mellitus   . Eczema   . GERD (gastroesophageal reflux disease)   . Hyperlipidemia   . Hypertension   . Leg ulcer ([place]) 04/24/2015   Right lateral leg No evidence of an infection Monitor closely Keep edema controlled   . Meralgia paresthetica    Dr. [name]  . Morbid obesity ([place])   . Morbid obesity ([place])   . Neuropathy    toes and legs  . Osteoarthrosis, unspecified whether generalized or localized, lower leg   . PUD (peptic ulcer disease)   . Shortness of breath dyspnea    with exertion  . Sleep apnea    per progress note 02/25/2018  . Type II or unspecified type diabetes mellitus without mention of complication, not stated as uncontrolled   . Unspecified hereditary and idiopathic peripheral neuropathy   . Vitamin B12 deficiency   . Wears glasses   . Wound cellulitis    right upper leg, healing well    Past Surgical History:  Procedure Laterality Date  . ABDOMINAL HYSTERECTOMY    . CARDIAC CATHETERIZATION  2002   non obstructive disease  . colonoscopy with polypectomy  2007 & 2012    hyperplastic ;Dr Watt Climes  . COLONOSCOPY  WITH PROPOFOL N/A 06/04/2015   Procedure: COLONOSCOPY WITH PROPOFOL;  Surgeon: Jerene Bears, MD;  Location: [facility];  Service: Gastroenterology;  Laterality: N/A;  . DILATION AND CURETTAGE OF UTERUS     multiple  . HEMORRHOID SURGERY    . renal calculi  12/1997   SVT with induction of anesthesia  . right knee arthroscopy    . TONSILLECTOMY AND ADENOIDECTOMY      Family History  Problem Relation Age of Onset  . Colon cancer Mother   . Prostate cancer Father   . Colon cancer Father   . Diabetes Maternal Aunt   . Breast cancer Maternal Aunt   . Diabetes Maternal Uncle   . Diabetes Paternal Aunt   . Stroke Paternal Aunt        > 65  . Heart disease Paternal Aunt   . Diabetes Paternal Uncle   . Breast cancer Maternal Aunt         X 2  . Breast cancer Cousin    Social History:  reports that she has never smoked. She has never used smokeless tobacco. She reports that she does not drink alcohol or use drugs.  Allergies:  Allergies  Allergen Reactions  . Penicillins Rash and Other (See Comments)  She was told not to take it anymore. Has patient had a PCN reaction causing immediate rash, facial/tongue/throat swelling, SOB or lightheadedness with hypotension: Yes Has patient had a PCN reaction causing severe rash involving mucus membranes or skin necrosis: No Has patient had a PCN reaction that required hospitalization: No Has patient had a PCN reaction occurring within the last 10 years: No If all of the above answers are "NO", then may proceed with Cephalosporin use.'  . Sulfonamide Derivatives Anaphylaxis and Other (See Comments)    REACTION: internal "burning"  . Benazepril Hcl Other (See Comments)    Patient unsure of reaction.  . Dipyridamole Other (See Comments)    Unknown  . Estrogens Other (See Comments)    Unknown  . Hydrochlorothiazide Other (See Comments)    Unknown  . Hydrochlorothiazide W-Triamterene Other (See Comments)    Hypokalemia  . Lotensin  [Benazepril Hcl] Other (See Comments)    Unknown  . Metronidazole Other (See Comments)    Patient unsure of reaction.  Marland Kitchen Spironolactone Other (See Comments)    "kidney problems"  . Sulfa Antibiotics Other (See Comments)    Unknown  . Torsemide Other (See Comments)    Patient unsure of reaction.  . Triamterene-Hctz Other (See Comments)    Unknown  . Valsartan Other (See Comments)    Patient unsure of reaction.    Medications Prior to Admission  Medication Sig Dispense Refill  . albuterol (PROVENTIL HFA) 108 (90 Base) MCG/ACT inhaler Inhale 2 puffs into the lungs every 4 (four) hours as needed for wheezing or shortness of breath. 1 Inhaler 4  . albuterol (PROVENTIL) (2.5 MG/3ML) 0.083% nebulizer solution Take 3 mLs (2.5 mg total) by nebulization every 6 (six) hours as needed for wheezing or shortness of breath. 75 mL 12  . aspirin (ECOTRIN LOW STRENGTH) 81 MG EC tablet Take 81 mg by mouth at bedtime.     . budesonide-formoterol (SYMBICORT) 160-4.5 MCG/ACT inhaler one - two inhalations every 12 hours; gargle and spit after use (Patient taking differently: Inhale 2 puffs into the lungs 2 (two) times daily as needed (shortness of breath). gargle and spit after use) 1 Inhaler 4  . carvedilol (COREG) 25 MG tablet TAKE 1 TABLET BY MOUTH TWICE DAILY WITH A MEAL (Patient taking differently: Take 25 mg by mouth 2 (two) times daily with a meal. ) 60 tablet 5  . Cod Liver Oil CAPS Take 1 capsule by mouth daily.     . cyanocobalamin (,VITAMIN B-12,) 1000 MCG/ML injection INJECT 1ML INTO MUSCLE EVERY 30 DAYS (Patient taking differently: Inject 1,000 mcg into the muscle every 30 (thirty) days. ) 10 mL 1  . diclofenac sodium (VOLTAREN) 1 % GEL Apply 2 g topically 4 (four) times daily. (Patient taking differently: Apply 2 g topically 3 (three) times daily as needed (for knee pain). ) 100 g 11  . doxycycline (VIBRAMYCIN) 100 MG capsule Take 1 capsule (100 mg total) by mouth 2 (two) times daily. One po bid x 7  days (Patient taking differently: Take 100 mg by mouth 2 (two) times daily. ) 14 capsule 0  . famotidine (PEPCID) 20 MG tablet Take 1 tablet (20 mg total) by mouth 2 (two) times daily. 180 tablet 3  . Flaxseed, Linseed, (FLAX SEED OIL) 1000 MG CAPS Take 1,000 mg by mouth 2 (two) times daily.     . fluocinonide cream (LIDEX) 5.57 % Apply 1 application topically 3 (three) times daily as needed (for itching).    Marland Kitchen  folic acid (FOLVITE) 160 MCG tablet Take 800 mcg by mouth at bedtime.     . furosemide (LASIX) 40 MG tablet Take 3 tablets (120 mg total) by mouth 2 (two) times daily. 360 tablet 5  . HYDROcodone-acetaminophen (NORCO/VICODIN) 5-325 MG tablet Take 1 tablet by mouth every 6 (six) hours as needed for moderate pain.    . hydrOXYzine (ATARAX/VISTARIL) 10 MG tablet Take 1 tablet (10 mg total) by mouth 3 (three) times daily as needed. (Patient taking differently: Take 10 mg by mouth 3 (three) times daily as needed for itching. ) 30 tablet 0  . Multiple Vitamin (MULTIVITAMIN) tablet Take 1 tablet by mouth daily.      . Omega-3 Fatty Acids (FISH OIL) 1000 MG CAPS Take 1,000 mg by mouth daily.     . OXcarbazepine (TRILEPTAL) 150 MG tablet Take 1 tablet (150 mg total) by mouth 2 (two) times daily. 60 tablet 11  . potassium chloride SA (K-DUR,KLOR-CON) 20 MEQ tablet TAKE 3 TABLETS BY MOUTH DAILY. (Patient taking differently: Take 60 mEq by mouth daily. ) 270 tablet 1  . simvastatin (ZOCOR) 40 MG tablet TAKE 1 TABLET BY MOUTH AT BEDTIME (Patient taking differently: Take 40 mg by mouth at bedtime. ) 90 tablet 1  . spironolactone (ALDACTONE) 25 MG tablet TAKE 1 TABLET BY MOUTH ONCE DAILY (Patient taking differently: Take 25 mg by mouth daily. ) 90 tablet 1  . triamcinolone cream (KENALOG) 0.1 % Apply 1 application topically daily.    . Lancets (ONETOUCH ULTRASOFT) lancets Use to help check blood sugars twice a day Dx E11.9 (Patient not taking: Reported on 03/01/2018) 100 each 3  . NEEDLE, DISP, 23 G (EASY  TOUCH FLIPLOCK NEEDLES) 23G X 1-1/2" MISC Use to inject B12 once monthly. (Patient not taking: Reported on 03/01/2018) 50 each 0  . nystatin cream (MYCOSTATIN) Apply 1 application topically 2 (two) times daily. 30 g 2  . oxyCODONE-acetaminophen (PERCOCET) 5-325 MG tablet Take 1 tablet by mouth every 6 (six) hours as needed for severe pain. (Patient not taking: Reported on 03/01/2018) 10 tablet 0  . SYRINGE-NEEDLE, DISP, 3 ML 23G X 1" 3 ML MISC Use with B12 to inject IM every 30 days (Patient not taking: Reported on 03/01/2018) 50 each 0    No results found for this or any previous visit (from the past 48 hour(s)). No results found.  Review of Systems  All other systems reviewed and are negative.   There were no vitals taken for this visit. Physical Exam  Patient is alert, oriented, no adenopathy, well-dressed, normal affect, normal respiratory effort. Examination patient is in a wheelchair she needs assistance for transfer from chair to standing.  She has a large traumatic wound over the mid aspect of the tibia that is transverse extends essentially 180 degrees around the leg with necrotic tissue in the wound and cellulitis with chronic venous insufficiency.  Patient has a sacral ulcer that was not evaluated today.  Patient has palpable pulses without arterial insufficiency.  She states she is allergic to penicillin and sulfa medications.  Patient has a BMI of 65 with morbid obesity.  She does not have protein caloric malnutrition. Assessment/Plan 1. Cellulitis of right lower limb   2. Idiopathic chronic venous hypertension of right lower extremity with ulcer and inflammation (HCC)   3. Traumatic wound dehiscence, sequela     Plan: Plan for surgical intervention  with approximately a 5-day hospital stay.  Patient has cellulitis and a dehisced traumatic wound of  the right leg she has severe venous insufficiency with need for urgent debridement of the dehisced traumatic wound placement of a  installation wound VAC follow-up with repeat irrigation debridements and possible split-thickness skin graft.  Discussed risks of persistent cellulitis further wound complications potential for amputation.    [name], MD 03/02/2018, 6:53 AM

## 2018-03-02 NOTE — Transfer of Care (Signed)
Immediate Anesthesia Transfer of Care Note  Patient: April [name]  Procedure(s) Performed: RIGHT LEG DEBRIDEMENT AND PLACE VAC (Right )  Patient Location: PACU  Anesthesia Type:General  Level of Consciousness: awake, alert  and oriented  Airway & Oxygen Therapy: Patient Spontanous Breathing and Patient connected to nasal cannula oxygen  Post-op Assessment: Report given to RN, Post -op Vital signs reviewed and stable and Patient moving all extremities  Post vital signs: Reviewed and stable  Last Vitals:  Vitals Value Taken Time  BP 131/68 03/02/2018  9:32 AM  Temp    Pulse 75 03/02/2018  9:33 AM  Resp 19 03/02/2018  9:33 AM  SpO2 100 % 03/02/2018  9:33 AM  Vitals shown include unvalidated device data.  Last Pain:  Vitals:   03/02/18 0728  TempSrc:   PainSc: 10-Worst pain ever      Patients Stated Pain Goal: 0 (97/35/32 9924)  Complications: No apparent anesthesia complications

## 2018-03-02 NOTE — Op Note (Signed)
03/02/2018  9:29 AM  PATIENT:  April [name]    PRE-OPERATIVE DIAGNOSIS:  cellulitis open wound right leg  POST-OPERATIVE DIAGNOSIS:  Same  PROCEDURE:  RIGHT LEG DEBRIDEMENT AND PLACE VAC Local tissue rearrangement for wound closure 9 x 26 cm. Application of multilayer compression wraps both lower extremities. Anterior compartment release right leg   SURGEON:  [name], MD  PHYSICIAN ASSISTANT:None ANESTHESIA:   General  PREOPERATIVE INDICATIONS:  [name] is a  68 y.o. female with a diagnosis of cellulitis open wound right leg who failed conservative measures and elected for surgical management.    The risks benefits and alternatives were discussed with the patient preoperatively including but not limited to the risks of infection, bleeding, nerve injury, cardiopulmonary complications, the need for revision surgery, among others, and the patient was willing to proceed.  OPERATIVE IMPLANTS: Local tissue rearrangement for wound closure 9 x 26 cm.  @ENCIMAGES @  OPERATIVE FINDINGS: Large deep wound with necrotic adipose tissue no abscess  OPERATIVE PROCEDURE: Patient was brought the operating room and underwent a general anesthetic.  After adequate levels anesthesia were obtained patient's right lower extremity was prepped using DuraPrep draped into a sterile field a timeout was called.  Elliptical incision was made around the necrotic wound edges.  This left a wound that was 9 x 26 cm.  There was significant amount of nonviable necrotic adipose tissue.  Using a rondure and a 21 blade knife the necrotic adipose tissue was resected.  The saphenous vein was intact.  Patient underwent fasciotomies of the anterior compartment to ensure no compartment syndrome.  The muscle was healthy and viable.  The wound was irrigated with normal saline.  The cleanse choice wound VAC sponges x2 were packed within the wound.  Local tissue rearrangement was then used to close the wound over the  wound VAC dressing.  Wound closure 26 x 9 cm.  This was connected to the [facility] pump set for 16 m of installation 10 minutes of dwell time 2 hours of suction at 125 mmHg.  Both legs were wrapped with web roll and a Coban wrap due to the massive venous stasis swelling.  Patient was extubated taken the PACU in stable condition.  DISCHARGE PLANNING:  Antibiotic duration: Continue IV antibiotics during hospital stay  Weightbearing: Weightbearing as tolerated  Pain medication: High-dose opioid pathway  Dressing care/ Wound VAC: Continue installation wound VAC  Ambulatory devices: Walker  Discharge to: Plan for return to the operating room on Friday.  Follow-up: In the office 1 week post operative.

## 2018-03-03 ENCOUNTER — Encounter (HOSPITAL_COMMUNITY): Payer: Self-pay | Admitting: General Practice

## 2018-03-03 ENCOUNTER — Ambulatory Visit: Payer: Medicare Other

## 2018-03-03 ENCOUNTER — Ambulatory Visit (INDEPENDENT_AMBULATORY_CARE_PROVIDER_SITE_OTHER): Payer: Self-pay | Admitting: Physician Assistant

## 2018-03-03 LAB — GLUCOSE, CAPILLARY
Glucose-Capillary: 119 mg/dL — ABNORMAL HIGH (ref 70–99)
Glucose-Capillary: 142 mg/dL — ABNORMAL HIGH (ref 70–99)
Glucose-Capillary: 117 mg/dL — ABNORMAL HIGH (ref 70–99)
Glucose-Capillary: 118 mg/dL — ABNORMAL HIGH (ref 70–99)

## 2018-03-03 MED ORDER — WHITE PETROLATUM EX OINT
TOPICAL_OINTMENT | CUTANEOUS | Status: AC
  Administered 2018-03-03: 0.2
  Filled 2018-03-03: qty 28.35

## 2018-03-03 NOTE — Progress Notes (Signed)
Patient ID: April [name], female   DOB: 1950/04/25, 68 y.o.   MRN: [id number] Postoperative day 1 status post irrigation and debridement right leg traumatic wound.  Patient had massive necrosis of the deep adipose layer.  Plan for return to the operating room tomorrow if the deep tissue was healthy and viable could plan for wound closure possible split-thickness skin graft.  Wound VAC is functioning well at this time

## 2018-03-03 NOTE — Progress Notes (Signed)
Physical Therapy Evaluation Patient Details Name: April [name] MRN: [id number] DOB: August 31, 1950 Today's Date: 03/03/2018   History of Present Illness  68 y.o. female admitted on 03/02/18 for R lower leg wound I and D, wound vac placement (origninal wound from fall on 02/15/18).  Plan to return to the OR on 03/04/18 for closure and skin graft.  Pt with significant PMH of neuropathy, DM2, morbid obesity, HTN, CHF, cellulitis of bil LEs, CAD, asthma, anemia, and R knee arthroscopy.    Clinical Impression  Pt limited by bil LE pain today, but was able to sit EOB for >10 mins and stand and take some side steps to [facility] with RW and two person assist.  She lives with a husband and son who both work and has STE her home (which I would think to be near impossible for her to do at this time).  She would benefit from post acute rehab before returning home where she will be alone at times.   PT to follow acutely for deficits listed below.      Follow Up Recommendations SNF;Supervision/Assistance - 24 hour    Equipment Recommendations       Recommendations for Other Services       Precautions / Restrictions Precautions Precautions: Fall Required Braces or Orthoses: Other Brace Other Brace: wound vac R lower leg Restrictions RLE Weight Bearing: Weight bearing as tolerated      Mobility  Bed Mobility Overal bed mobility: Needs Assistance Bed Mobility: Supine to Sit;Sit to Supine     Supine to sit: HOB elevated;Min assist Sit to supine: +2 for physical assistance;Mod assist   General bed mobility comments: Min assist to move to EOB with HOB maximally elevated and assisting each leg separately.  Mod assist of two people for return to bed as one person had to lift one leg each.  Pt reports she has been sleeping in a recliner chair at home.   Transfers Overall transfer level: Needs assistance Equipment used: Rolling walker (2 wheeled) Transfers: Sit to/from Stand Sit to Stand: Mod assist;From  elevated surface;+2 physical assistance         General transfer comment: Two person mod assist to stand from elevated bed.  Pt forward flexed over RW, unable to straighten up.   Ambulation/Gait Ambulation/Gait assistance: +2 safety/equipment;Min assist Gait Distance (Feet): 3 Feet Assistive device: Rolling walker (2 wheeled) Gait Pattern/deviations: Step-to pattern     General Gait Details: Pt took 4-5 side steps up to [facility] with RW to be in a better position to return to supine.  Min assist overall, second person for safety and pt continues to be flexed nearly 90 degrees over the RW at her hips/trunk during side stepping.          Balance Overall balance assessment: Needs assistance Sitting-balance support: Feet supported;No upper extremity supported Sitting balance-Leahy Scale: Good Sitting balance - Comments: supervision EOB.  She sat for >10 mins EOB speaking with PT and RN.    Standing balance support: Bilateral upper extremity supported Standing balance-Leahy Scale: Poor Standing balance comment: reliant on external support for standing.                              Pertinent Vitals/Pain Pain Assessment: Faces Faces Pain Scale: Hurts whole lot Pain Location: bil LEs R>L Pain Descriptors / Indicators: Crying;Grimacing;Guarding Pain Intervention(s): Limited activity within patient's tolerance;Monitored during session;Repositioned;Patient requesting pain meds-RN notified    Home Living  Family/patient expects to be discharged to:: Private residence Living Arrangements: Spouse/significant other;Other (Comment);Children(son, father who is 95 helps a lot as well) Available Help at Discharge: Family;Available PRN/intermittently Type of Home: House Home Access: Stairs to enter Entrance Stairs-Rails: Left Entrance Stairs-Number of Steps: 4 Home Layout: One level Home Equipment: Walker - 2 wheels;Cane - quad;Wheelchair - manual      Prior Function Level of  Independence: Needs assistance   Gait / Transfers Assistance Needed: with RW or cane, short distances.  WC for community access  ADL's / Homemaking Assistance Needed: "it is a struggle"         Hand Dominance   Dominant Hand: Right    Extremity/Trunk Assessment   Upper Extremity Assessment Upper Extremity Assessment: Defer to OT evaluation    Lower Extremity Assessment Lower Extremity Assessment: Generalized weakness;RLE deficits/detail RLE Deficits / Details: right leg is functionally weaker than left leg likely due to increased pain on the right leg.  Difficult to truely assess strength in bed or EOB due to body habitus.  Pt was able to bear weight on both legs for side stepping up to [facility], so I would anticipate her to be at least 4/5 if not stronger.      Cervical / Trunk Assessment Cervical / Trunk Assessment: Normal  Communication   Communication: No difficulties  Cognition Arousal/Alertness: Lethargic;Suspect due to medications Behavior During Therapy: [facility] for tasks assessed/performed Overall Cognitive Status: Within Functional Limits for tasks assessed                                 General Comments: Pt having difficulty keeping her eyes open during therapy session.              Assessment/Plan    PT Assessment Patient needs continued PT services  PT Problem List Decreased strength;Decreased activity tolerance;Decreased balance;Decreased mobility;Decreased knowledge of use of DME;Obesity;Pain;Decreased skin integrity       PT Treatment Interventions DME instruction;Gait training;Stair training;Functional mobility training;Therapeutic activities;Therapeutic exercise;Balance training;Patient/family education    PT Goals (Current goals can be found in the Care Plan section)  Acute Rehab PT Goals Patient Stated Goal: to decrease pain, get  some rest PT Goal Formulation: With patient Time For Goal Achievement: 03/17/18 Potential to Achieve Goals:  Good    Frequency Min 3X/week   Barriers to discharge Inaccessible home environment;Decreased caregiver support         AM-PAC PT "6 Clicks" Mobility  Outcome Measure Help needed turning from your back to your side while in a flat bed without using bedrails?: A Lot Help needed moving from lying on your back to sitting on the side of a flat bed without using bedrails?: A Little Help needed moving to and from a bed to a chair (including a wheelchair)?: A Lot Help needed standing up from a chair using your arms (e.g., wheelchair or bedside chair)?: A Lot Help needed to walk in hospital room?: A Lot Help needed climbing 3-5 steps with a railing? : Total 6 Click Score: 12    End of Session Equipment Utilized During Treatment: Oxygen(2 L O2 [place]) Activity Tolerance: Patient limited by pain Patient left: in bed;with call bell/phone within reach Nurse Communication: Mobility status;Patient requests pain meds PT Visit Diagnosis: Muscle weakness (generalized) (M62.81);Difficulty in walking, not elsewhere classified (R26.2);Pain Pain - Right/Left: Right Pain - part of body: Leg    Time: 1511-1606 PT Time Calculation (min) (ACUTE ONLY):  55 min   Charges:          Wells Guiles B. Massiah Minjares, PT, DPT  Acute Rehabilitation #[phone] pager #[phone] office   PT Evaluation $PT Eval Moderate Complexity: 1 Mod PT Treatments $Therapeutic Activity: 38-52 mins       03/03/2018, 4:21 PM

## 2018-03-03 NOTE — Plan of Care (Signed)
  Problem: Elimination: Goal: Will not experience complications related to bowel motility Outcome: Progressing Goal: Will not experience complications related to urinary retention Outcome: Progressing   

## 2018-03-04 ENCOUNTER — Encounter (HOSPITAL_COMMUNITY)
Admission: RE | Disposition: A | Discharge status: Skilled Nursing Facility | Payer: Self-pay | Source: Home / Self Care | Attending: Orthopedic Surgery

## 2018-03-04 ENCOUNTER — Encounter (HOSPITAL_COMMUNITY): Payer: Self-pay

## 2018-03-04 ENCOUNTER — Inpatient Hospital Stay (HOSPITAL_COMMUNITY): Payer: Medicare Other | Admitting: Registered Nurse

## 2018-03-04 DIAGNOSIS — T8133XD Disruption of traumatic injury wound repair, subsequent encounter: Secondary | ICD-10-CM

## 2018-03-04 HISTORY — PX: I & D EXTREMITY: SHX5045

## 2018-03-04 HISTORY — PX: SKIN SPLIT GRAFT: SHX444

## 2018-03-04 LAB — GLUCOSE, CAPILLARY
GLUCOSE-CAPILLARY: 86 mg/dL (ref 70–99)
Glucose-Capillary: 135 mg/dL — ABNORMAL HIGH (ref 70–99)
Glucose-Capillary: 113 mg/dL — ABNORMAL HIGH (ref 70–99)
Glucose-Capillary: 138 mg/dL — ABNORMAL HIGH (ref 70–99)
Glucose-Capillary: 145 mg/dL — ABNORMAL HIGH (ref 70–99)

## 2018-03-04 LAB — SURGICAL PCR SCREEN
MRSA, PCR: NEGATIVE
Staphylococcus aureus: NEGATIVE

## 2018-03-04 SURGERY — IRRIGATION AND DEBRIDEMENT EXTREMITY
Anesthesia: General | Laterality: Right

## 2018-03-04 MED ORDER — LIDOCAINE 2% (20 MG/ML) 5 ML SYRINGE
INTRAMUSCULAR | Status: DC | PRN
  Administered 2018-03-04: 100 mg via INTRAVENOUS

## 2018-03-04 MED ORDER — PROPOFOL 10 MG/ML IV BOLUS
INTRAVENOUS | Status: AC
  Filled 2018-03-04: qty 20

## 2018-03-04 MED ORDER — ALBUTEROL SULFATE HFA 108 (90 BASE) MCG/ACT IN AERS
INHALATION_SPRAY | RESPIRATORY_TRACT | Status: DC | PRN
  Administered 2018-03-04: 4 via RESPIRATORY_TRACT

## 2018-03-04 MED ORDER — MIDAZOLAM HCL 5 MG/5ML IJ SOLN
INTRAMUSCULAR | Status: DC | PRN
  Administered 2018-03-04: 2 mg via INTRAVENOUS

## 2018-03-04 MED ORDER — FENTANYL CITRATE (PF) 250 MCG/5ML IJ SOLN
INTRAMUSCULAR | Status: DC | PRN
  Administered 2018-03-04: 100 ug via INTRAVENOUS

## 2018-03-04 MED ORDER — DEXAMETHASONE SODIUM PHOSPHATE 10 MG/ML IJ SOLN
INTRAMUSCULAR | Status: DC | PRN
  Administered 2018-03-04: 5 mg via INTRAVENOUS

## 2018-03-04 MED ORDER — PHENYLEPHRINE HCL 10 MG/ML IJ SOLN
INTRAMUSCULAR | Status: DC | PRN
  Administered 2018-03-04 (×2): 80 ug via INTRAVENOUS
  Administered 2018-03-04 (×2): 160 ug via INTRAVENOUS
  Administered 2018-03-04: 80 ug via INTRAVENOUS

## 2018-03-04 MED ORDER — CHLORHEXIDINE GLUCONATE 4 % EX LIQD
60.0000 mL | Freq: Once | CUTANEOUS | Status: AC
  Administered 2018-03-04: 4 via TOPICAL

## 2018-03-04 MED ORDER — ALBUTEROL SULFATE HFA 108 (90 BASE) MCG/ACT IN AERS
INHALATION_SPRAY | RESPIRATORY_TRACT | Status: AC
  Filled 2018-03-04: qty 6.7

## 2018-03-04 MED ORDER — OXYCODONE HCL 5 MG PO TABS
5.0000 mg | ORAL_TABLET | ORAL | Status: DC | PRN
  Administered 2018-03-04: 5 mg via ORAL
  Administered 2018-03-04 – 2018-03-09 (×5): 10 mg via ORAL
  Filled 2018-03-04 (×5): qty 2

## 2018-03-04 MED ORDER — FENTANYL CITRATE (PF) 250 MCG/5ML IJ SOLN
INTRAMUSCULAR | Status: AC
  Filled 2018-03-04: qty 5

## 2018-03-04 MED ORDER — ALBUMIN HUMAN 5 % IV SOLN
INTRAVENOUS | Status: DC | PRN
  Administered 2018-03-04: 12:00:00 via INTRAVENOUS

## 2018-03-04 MED ORDER — SODIUM CHLORIDE 0.9 % IR SOLN
Status: DC | PRN
  Administered 2018-03-04: 3000 mL

## 2018-03-04 MED ORDER — METHOCARBAMOL 500 MG PO TABS
500.0000 mg | ORAL_TABLET | Freq: Four times a day (QID) | ORAL | Status: DC | PRN
  Administered 2018-03-04 – 2018-03-09 (×8): 500 mg via ORAL
  Filled 2018-03-04 (×9): qty 1

## 2018-03-04 MED ORDER — EPHEDRINE SULFATE 50 MG/ML IJ SOLN
INTRAMUSCULAR | Status: DC | PRN
  Administered 2018-03-04: 10 mg via INTRAVENOUS

## 2018-03-04 MED ORDER — SUCCINYLCHOLINE CHLORIDE 20 MG/ML IJ SOLN
INTRAMUSCULAR | Status: DC | PRN
  Administered 2018-03-04: 140 mg via INTRAVENOUS

## 2018-03-04 MED ORDER — MIDAZOLAM HCL 2 MG/2ML IJ SOLN
INTRAMUSCULAR | Status: AC
  Filled 2018-03-04: qty 2

## 2018-03-04 MED ORDER — ONDANSETRON HCL 4 MG/2ML IJ SOLN
INTRAMUSCULAR | Status: DC | PRN
  Administered 2018-03-04: 4 mg via INTRAVENOUS

## 2018-03-04 MED ORDER — OXYCODONE HCL 5 MG PO TABS
ORAL_TABLET | ORAL | Status: AC
  Administered 2018-03-04: 5 mg via ORAL
  Filled 2018-03-04: qty 1

## 2018-03-04 MED ORDER — LACTATED RINGERS IV SOLN
INTRAVENOUS | Status: DC | PRN
  Administered 2018-03-04: 12:00:00 via INTRAVENOUS

## 2018-03-04 MED ORDER — 0.9 % SODIUM CHLORIDE (POUR BTL) OPTIME
TOPICAL | Status: DC | PRN
  Administered 2018-03-04 (×2): 1000 mL

## 2018-03-04 MED ORDER — LACTATED RINGERS IV SOLN
INTRAVENOUS | Status: DC
  Administered 2018-03-04: 11:00:00 via INTRAVENOUS

## 2018-03-04 MED ORDER — PROPOFOL 10 MG/ML IV BOLUS
INTRAVENOUS | Status: DC | PRN
  Administered 2018-03-04: 150 mg via INTRAVENOUS

## 2018-03-04 MED ORDER — CLINDAMYCIN PHOSPHATE 900 MG/50ML IV SOLN
900.0000 mg | INTRAVENOUS | Status: AC
  Administered 2018-03-04: 900 mg via INTRAVENOUS
  Filled 2018-03-04: qty 50

## 2018-03-04 MED ORDER — FENTANYL CITRATE (PF) 100 MCG/2ML IJ SOLN
INTRAMUSCULAR | Status: AC
  Administered 2018-03-04: 50 ug via INTRAVENOUS
  Filled 2018-03-04: qty 2

## 2018-03-04 MED ORDER — FENTANYL CITRATE (PF) 100 MCG/2ML IJ SOLN
25.0000 ug | INTRAMUSCULAR | Status: DC | PRN
  Administered 2018-03-04 (×2): 50 ug via INTRAVENOUS

## 2018-03-04 MED ORDER — METHOCARBAMOL 500 MG PO TABS
ORAL_TABLET | ORAL | Status: AC
  Administered 2018-03-04: 500 mg via ORAL
  Filled 2018-03-04: qty 1

## 2018-03-04 MED ORDER — LACTATED RINGERS IV SOLN: INTRAVENOUS | Status: DC

## 2018-03-04 MED ORDER — METHOCARBAMOL 1000 MG/10ML IJ SOLN
500.0000 mg | Freq: Four times a day (QID) | INTRAVENOUS | Status: DC | PRN
  Filled 2018-03-04: qty 5

## 2018-03-04 MED ORDER — OXYCODONE HCL 5 MG PO TABS
10.0000 mg | ORAL_TABLET | ORAL | Status: DC | PRN
  Administered 2018-03-04 – 2018-03-06 (×7): 15 mg via ORAL
  Administered 2018-03-07: 10 mg via ORAL
  Administered 2018-03-09: 15 mg via ORAL
  Filled 2018-03-04 (×7): qty 3
  Filled 2018-03-04: qty 2
  Filled 2018-03-04: qty 3

## 2018-03-04 SURGICAL SUPPLY — 60 items
ALLOGRAFT SKIN MESHD 125 SQ CM (Tissue) ×1 IMPLANT
BLADE SURG 21 STRL SS (BLADE) ×3 IMPLANT
BNDG CMPR 9X4 STRL LF SNTH (GAUZE/BANDAGES/DRESSINGS) ×1
BNDG COHESIVE 6X5 TAN STRL LF (GAUZE/BANDAGES/DRESSINGS) ×2 IMPLANT
BNDG ESMARK 4X9 LF (GAUZE/BANDAGES/DRESSINGS) ×3 IMPLANT
BNDG GAUZE ELAST 4 BULKY (GAUZE/BANDAGES/DRESSINGS) ×6 IMPLANT
BNDG GAUZE STRTCH 6 (GAUZE/BANDAGES/DRESSINGS) IMPLANT
COVER SURGICAL LIGHT HANDLE (MISCELLANEOUS) ×6 IMPLANT
COVER WAND RF STERILE (DRAPES) ×3 IMPLANT
CUFF TOURNIQUET SINGLE 18IN (TOURNIQUET CUFF) IMPLANT
CUFF TOURNIQUET SINGLE 24IN (TOURNIQUET CUFF) IMPLANT
DERMACARRIERS GRAFT 1 TO 1.5 (DISPOSABLE)
DRAPE INCISE IOBAN 66X45 STRL (DRAPES) ×2 IMPLANT
DRAPE U-SHAPE 47X51 STRL (DRAPES) ×3 IMPLANT
DRESSING PREVENA PLUS CUSTOM (GAUZE/BANDAGES/DRESSINGS) IMPLANT
DRESSING VERAFLO CLEANSE CC (GAUZE/BANDAGES/DRESSINGS) IMPLANT
DRSG ADAPTIC 3X8 NADH LF (GAUZE/BANDAGES/DRESSINGS) ×3 IMPLANT
DRSG MEPITEL 4X7.2 (GAUZE/BANDAGES/DRESSINGS) ×3 IMPLANT
DRSG PREVENA PLUS CUSTOM (GAUZE/BANDAGES/DRESSINGS) ×3
DRSG VERAFLO CLEANSE CC (GAUZE/BANDAGES/DRESSINGS) ×3
DURAPREP 26ML APPLICATOR (WOUND CARE) ×3 IMPLANT
ELECT REM PT RETURN 9FT ADLT (ELECTROSURGICAL) ×3
ELECTRODE REM PT RTRN 9FT ADLT (ELECTROSURGICAL) ×1 IMPLANT
GAUZE SPONGE 4X4 12PLY STRL (GAUZE/BANDAGES/DRESSINGS) ×3 IMPLANT
GLOVE BIOGEL PI IND STRL 9 (GLOVE) ×1 IMPLANT
GLOVE BIOGEL PI INDICATOR 9 (GLOVE) ×2
GLOVE SURG ORTHO 9.0 STRL STRW (GLOVE) ×3 IMPLANT
GOWN STRL REUS W/ TWL XL LVL3 (GOWN DISPOSABLE) ×2 IMPLANT
GOWN STRL REUS W/TWL XL LVL3 (GOWN DISPOSABLE) ×6
GRAFT DERMACARRIERS 1 TO 1.5 (DISPOSABLE) IMPLANT
GRAFT TISS MESH 125 BURN (Tissue) IMPLANT
HANDPIECE INTERPULSE COAX TIP (DISPOSABLE)
KIT BASIN OR (CUSTOM PROCEDURE TRAY) ×3 IMPLANT
KIT TURNOVER KIT B (KITS) ×3 IMPLANT
MANIFOLD NEPTUNE II (INSTRUMENTS) ×3 IMPLANT
NDL HYPO 25GX1X1/2 BEV (NEEDLE) IMPLANT
NEEDLE HYPO 25GX1X1/2 BEV (NEEDLE) IMPLANT
NS IRRIG 1000ML POUR BTL (IV SOLUTION) ×3 IMPLANT
PACK ORTHO EXTREMITY (CUSTOM PROCEDURE TRAY) ×3 IMPLANT
PAD ARMBOARD 7.5X6 YLW CONV (MISCELLANEOUS) ×6 IMPLANT
PAD CAST 4YDX4 CTTN HI CHSV (CAST SUPPLIES) IMPLANT
PAD NEG PRESSURE SENSATRAC (MISCELLANEOUS) ×2 IMPLANT
PADDING CAST COTTON 4X4 STRL (CAST SUPPLIES) ×3
SET HNDPC FAN SPRY TIP SCT (DISPOSABLE) IMPLANT
SKIN MESHED 125 SQ CM (Tissue) ×3 IMPLANT
STOCKINETTE IMPERVIOUS 9X36 MD (GAUZE/BANDAGES/DRESSINGS) IMPLANT
SUCTION FRAZIER HANDLE 10FR (MISCELLANEOUS)
SUCTION TUBE FRAZIER 10FR DISP (MISCELLANEOUS) IMPLANT
SUT ETHILON 2 0 PSLX (SUTURE) ×8 IMPLANT
SUT ETHILON 4 0 PS 2 18 (SUTURE) IMPLANT
SWAB COLLECTION DEVICE MRSA (MISCELLANEOUS) ×3 IMPLANT
SWAB CULTURE ESWAB REG 1ML (MISCELLANEOUS) IMPLANT
SYR CONTROL 10ML LL (SYRINGE) IMPLANT
TOWEL OR 17X24 6PK STRL BLUE (TOWEL DISPOSABLE) ×3 IMPLANT
TOWEL OR 17X26 10 PK STRL BLUE (TOWEL DISPOSABLE) ×3 IMPLANT
TUBE CONNECTING 12'X1/4 (SUCTIONS) ×1
TUBE CONNECTING 12X1/4 (SUCTIONS) ×2 IMPLANT
WATER STERILE IRR 1000ML POUR (IV SOLUTION) ×3 IMPLANT
WND VAC CANISTER 500ML (MISCELLANEOUS) ×2 IMPLANT
YANKAUER SUCT BULB TIP NO VENT (SUCTIONS) ×3 IMPLANT

## 2018-03-04 NOTE — Transfer of Care (Signed)
Immediate Anesthesia Transfer of Care Note  Patient: Francena Hanly  Procedure(s) Performed: REPEAT IRRIGATION AND DEBRIDEMENT RIGHT LEG, PLACE WOUND VAC (Right ) POSSIBLE SPLIT THICKNESS SKIN GRAFT (Right )  Patient Location: PACU  Anesthesia Type:General  Level of Consciousness: drowsy, patient cooperative and responds to stimulation  Airway & Oxygen Therapy: Patient Spontanous Breathing and Patient connected to nasal cannula oxygen  Post-op Assessment: Report given to RN and Post -op Vital signs reviewed and stable  Post vital signs: Reviewed and stable  Last Vitals:  Vitals Value Taken Time  BP 130/76 03/04/2018 12:47 PM  Temp 36.1 C 03/04/2018 12:46 PM  Pulse 82 03/04/2018 12:48 PM  Resp 21 03/04/2018 12:48 PM  SpO2 98 % 03/04/2018 12:48 PM  Vitals shown include unvalidated device data.  Last Pain:  Vitals:   03/04/18 0907  TempSrc:   PainSc: 3       Patients Stated Pain Goal: 3 (97/53/00 5110)  Complications: No apparent anesthesia complications

## 2018-03-04 NOTE — Anesthesia Postprocedure Evaluation (Signed)
Anesthesia Post Note  Patient: Francena Hanly  Procedure(s) Performed: REPEAT IRRIGATION AND DEBRIDEMENT RIGHT LEG, PLACE WOUND VAC (Right ) POSSIBLE SPLIT THICKNESS SKIN GRAFT (Right )     Patient location during evaluation: PACU Anesthesia Type: General Level of consciousness: awake Pain management: pain level controlled Vital Signs Assessment: post-procedure vital signs reviewed and stable Respiratory status: spontaneous breathing Cardiovascular status: stable Postop Assessment: no apparent nausea or vomiting Anesthetic complications: no    Last Vitals:  Vitals:   03/04/18 1330 03/04/18 1405  BP: 103/61 95/65  Pulse: 76 78  Resp: 14 (!) 21  Temp:    SpO2: 96% 96%    Last Pain:  Vitals:   03/04/18 1415  TempSrc:   PainSc: 10-Worst pain ever                 Lanetta Figuero

## 2018-03-04 NOTE — Progress Notes (Signed)
OT Cancellation Note  Patient Details Name: April [name] MRN: [id number] DOB: 05/20/1950   Cancelled Treatment:    Reason Eval/Treat Not Completed: Patient at procedure or test/ unavailable(OR) Pt at repeat I&D. OT will continue to follow for evaluation  Jaci Carrel 03/04/2018, 1:34 PM

## 2018-03-04 NOTE — Interval H&P Note (Signed)
History and Physical Interval Note:  03/04/2018 8:04 AM  April [name]  has presented today for surgery, with the diagnosis of Cellulitis, Open Wound Right Leg  The various methods of treatment have been discussed with the patient and family. After consideration of risks, benefits and other options for treatment, the patient has consented to  Procedure(s): REPEAT IRRIGATION AND DEBRIDEMENT RIGHT LEG, PLACE WOUND VAC (Right) POSSIBLE SPLIT THICKNESS SKIN GRAFT (Right) as a surgical intervention .  The patient's history has been reviewed, patient examined, no change in status, stable for surgery.  I have reviewed the patient's chart and labs.  Questions were answered to the patient's satisfaction.     Erlinda Hong, PA-C [employer] [phone]

## 2018-03-04 NOTE — Anesthesia Preprocedure Evaluation (Addendum)
Anesthesia Evaluation  Patient identified by MRN, date of birth, ID band Patient awake    Reviewed: Allergy & Precautions, NPO status , Patient's Chart, lab work & pertinent test results  Airway Mallampati: II  TM Distance: >3 FB     Dental   Pulmonary shortness of breath, asthma , sleep apnea ,    breath sounds clear to auscultation       Cardiovascular hypertension, + CAD and +CHF   Rhythm:Regular Rate:Normal     Neuro/Psych    GI/Hepatic PUD, GERD  ,  Endo/Other  diabetes  Renal/GU Renal disease     Musculoskeletal   Abdominal   Peds  Hematology   Anesthesia Other Findings   Reproductive/Obstetrics                             Anesthesia Physical Anesthesia Plan  ASA: III  Anesthesia Plan: General   Post-op Pain Management:    Induction: Intravenous  PONV Risk Score and Plan: Ondansetron, Dexamethasone and Midazolam  Airway Management Planned: Oral ETT  Additional Equipment:   Intra-op Plan:   Post-operative Plan: Extubation in OR  Informed Consent: I have reviewed the patients History and Physical, chart, labs and discussed the procedure including the risks, benefits and alternatives for the proposed anesthesia with the patient or authorized representative who has indicated his/her understanding and acceptance.     Dental advisory given  Plan Discussed with: CRNA and Anesthesiologist  Anesthesia Plan Comments:        Anesthesia Quick Evaluation

## 2018-03-04 NOTE — Care Management Important Message (Signed)
Important Message  Patient Details  Name: April [name] MRN: [id number] Date of Birth: [birth year]   Medicare Important Message Given:  Yes    Laycie Schriner Montine Circle 03/04/2018, 2:36 PM

## 2018-03-04 NOTE — Anesthesia Procedure Notes (Signed)
Procedure Name: Intubation Date/Time: 03/04/2018 11:53 AM Performed by: Neldon Newport, CRNA Pre-anesthesia Checklist: Timeout performed, Patient being monitored, Emergency Drugs available, Suction available and Patient identified Patient Re-evaluated:Patient Re-evaluated prior to induction Oxygen Delivery Method: Circle system utilized Preoxygenation: Pre-oxygenation with 100% oxygen Induction Type: IV induction and Rapid sequence Ventilation: Mask ventilation without difficulty Laryngoscope Size: Mac and 3 Grade View: Grade I Tube type: Oral Tube size: 7.0 mm Number of attempts: 1 Placement Confirmation: ETT inserted through vocal cords under direct vision,  positive ETCO2 and breath sounds checked- equal and bilateral Secured at: 21 cm Tube secured with: Tape Dental Injury: Teeth and Oropharynx as per pre-operative assessment

## 2018-03-04 NOTE — Op Note (Signed)
03/04/2018  12:38 PM  PATIENT:  April [name]    PRE-OPERATIVE DIAGNOSIS:  Cellulitis, Open Wound Right Leg  POST-OPERATIVE DIAGNOSIS:  Same  PROCEDURE:  REPEAT IRRIGATION AND DEBRIDEMENT RIGHT LEG excision of skin and soft tissue muscle and fascia. , PLACE WOUND VAC,  SPLIT THICKNESS SKIN GRAFT 125 cm. Local tissue rearrangement for wound closure 34 x 9 cm.  SURGEON:  [name], MD  PHYSICIAN ASSISTANT:None ANESTHESIA:   General  PREOPERATIVE INDICATIONS:  April [name] is a  68 y.o. female with a diagnosis of Cellulitis, Open Wound Right Leg who failed conservative measures and elected for surgical management.    The risks benefits and alternatives were discussed with the patient preoperatively including but not limited to the risks of infection, bleeding, nerve injury, cardiopulmonary complications, the need for revision surgery, among others, and the patient was willing to proceed.  OPERATIVE IMPLANTS: Allograft split-thickness skin graft 125 cm.  @ENCIMAGES @  OPERATIVE FINDINGS: Improved granulation tissue at the wound bed however the wound edges were ischemic.  OPERATIVE PROCEDURE: Patient was brought the operating room and underwent a general anesthetic.  After adequate levels anesthesia were obtained patient's right lower extremity was prepped using DuraPrep draped into a sterile field a timeout was called.  Elliptical incision was made around the surgical incision to excise the necrotic wound edges with a 21 blade knife.  This left a wound that was 34 x 9 cm.  There was further necrotic fascia which was removed with a rondure were.  She had muscle that was gray in color and further muscle debridement was performed.  The wound was irrigated with pulsatile lavage there was good petechial bleeding after pulsatile lavage.  The 125 cm skin graft was then stapled in place the wound was then closed using local tissue rearrangement with 2-0 nylon to partially close the wound.   This was then covered with a reticulated cleanse choice dressing followed by the solid cleanse choice dressing this was hooked to suction patient underwent compression wrap with web roll and Coban.   DISCHARGE PLANNING:  Antibiotic duration: Continue IV antibiotics  Weightbearing: Weightbearing as tolerated  Pain medication: High-dose opioid pathway  Dressing care/ Wound VAC: The cleanse choice dressing will remain in place for 2 weeks  Ambulatory devices: Walker  Discharge to: Anticipate discharge to home  Follow-up: In the office 1 week post operative.

## 2018-03-05 LAB — GLUCOSE, CAPILLARY
GLUCOSE-CAPILLARY: 110 mg/dL — AB (ref 70–99)
Glucose-Capillary: 146 mg/dL — ABNORMAL HIGH (ref 70–99)
Glucose-Capillary: 109 mg/dL — ABNORMAL HIGH (ref 70–99)
Glucose-Capillary: 134 mg/dL — ABNORMAL HIGH (ref 70–99)

## 2018-03-05 MED ORDER — ACETAMINOPHEN-CODEINE #3 300-30 MG PO TABS: 1.0000 | ORAL_TABLET | Freq: Four times a day (QID) | ORAL | Status: DC | PRN

## 2018-03-05 NOTE — Progress Notes (Signed)
Pt complained of recurrent cough. Pt coughed up green sputum and states she feels like she is getting sick. MD on call paged. Will continue to monitor pt.

## 2018-03-05 NOTE — Plan of Care (Signed)
  Problem: Education: Goal: Knowledge of General Education information will improve Description: Including pain rating scale, medication(s)/side effects and non-pharmacologic comfort measures Outcome: Progressing   Problem: Health Behavior/Discharge Planning: Goal: Ability to manage health-related needs will improve Outcome: Progressing   Problem: Activity: Goal: Risk for activity intolerance will decrease Outcome: Progressing   

## 2018-03-05 NOTE — Progress Notes (Signed)
Subjective: 1 Day Post-Op Procedure(s) (LRB): REPEAT IRRIGATION AND DEBRIDEMENT RIGHT LEG, PLACE WOUND VAC (Right) POSSIBLE SPLIT THICKNESS SKIN GRAFT (Right) Patient reports pain as mild.    Objective: Vital signs in last 24 hours: Temp:  [97 F (36.1 C)-98.4 F (36.9 C)] 97.5 F (36.4 C) (02/01 0351) Pulse Rate:  [70-88] 75 (02/01 0831) Resp:  [13-21] 14 (01/31 1958) BP: (94-132)/(58-94) 132/94 (02/01 0831) SpO2:  [94 %-98 %] 98 % (02/01 0831) FiO2 (%):  [28 %] 28 % (01/31 0852)  Intake/Output from previous day: 01/31 0701 - 02/01 0700 In: 1521.1 [P.O.:360; I.V.:661.1; IV Piggyback:500] Out: 2445 [Urine:2350; Drains:75; Blood:20] Intake/Output this shift: No intake/output data recorded.  No results for input(s): HGB in the last 72 hours. No results for input(s): WBC, RBC, HCT, PLT in the last 72 hours. No results for input(s): NA, K, CL, CO2, BUN, CREATININE, GLUCOSE, CALCIUM in the last 72 hours. No results for input(s): LABPT, INR in the last 72 hours.  Right leg with VAC dressing in place over STSG and functioning well. Vac canister with 100 cc of serosanguinous drainage.    Assessment/Plan: 1 Day Post-Op Procedure(s) (LRB): REPEAT IRRIGATION AND DEBRIDEMENT RIGHT LEG, PLACE WOUND VAC (Right) POSSIBLE SPLIT THICKNESS SKIN GRAFT (Right) Discharge to SNF . Very slow progress with therapy.  Continue VAC to STSG.     Erlinda Hong, PA-C 03/05/2018, 8:43 AM [employer] [phone]

## 2018-03-05 NOTE — Clinical Social Work Note (Signed)
Clinical Social Work Assessment  Patient Details  Name: April [name] MRN: [id number] Date of Birth: Aug 21, 1950  Date of referral:  03/05/18               Reason for consult:  Discharge Planning                Permission sought to share information with:  Case Manager, Facility Sport and exercise psychologist Permission granted to share information::  Yes, Verbal Permission Granted  Name::        Agency::  SNFs  Relationship::     Contact Information:     Housing/Transportation Living arrangements for the past 2 months:  Single Family Home Source of Information:  Patient Patient Interpreter Needed:  None Criminal Activity/Legal Involvement Pertinent to Current Situation/Hospitalization:  No - Comment as needed Significant Relationships:  Spouse Lives with:  Spouse Do you feel safe going back to the place where you live?  No Need for family participation in patient care:  No (Coment)  Care giving concerns:  CSW received referral for possible SNF placement at time of discharge. Spoke with patient regarding possibility of SNF placement . Patient's  family  is currently unable to care for her at their home given patient's current needs and fall risk.  Patient  expressed understanding of PT recommendation and are agreeable to SNF placement at time of discharge. CSW to continue to follow and assist with discharge planning needs.     Social Worker assessment / plan: Spoke with patient concerning possibility of rehab at SNF before returning home. Patient has been to rehab in the past and is familiar with [facility] in which she reports they had very good Physical Therapy services.    Employment status:  Retired Nurse, adult PT Recommendations:  [place] / Referral to community resources:  [place]  Patient/Family's Response to care:  Patient recognize need for rehab before returning home and are agreeable to a SNF  in [place]. They report preference for  [facility]    . CSW explained insurance authorization process. CSW was not given permission at this time to communicate with any family members regarding discharge planning.   Patient/Family's Understanding of and Emotional Response to Diagnosis, Current Treatment, and Prognosis: Patient is realistic regarding therapy needs and expressed being hopeful for SNF placement. Patient expressed understanding of CSW role and discharge process as well as medical condition. No questions/concerns about plan or treatment.    Emotional Assessment Appearance:  Appears stated age Attitude/Demeanor/Rapport:  Gracious Affect (typically observed):  Accepting Orientation:  Oriented to Self, Oriented to  Time, Oriented to Situation Alcohol / Substance use:  Not Applicable Psych involvement (Current and /or in the community):  No (Comment)  Discharge Needs  Concerns to be addressed:  Discharge Planning Concerns Readmission within the last 30 days:  No Current discharge risk:  Dependent with Mobility Barriers to Discharge:  Continued Medical Work up   [employer], [place] 03/05/2018, 10:57 AM

## 2018-03-05 NOTE — NC FL2 (Signed)
[place] MEDICAID FL2 LEVEL OF CARE SCREENING TOOL     IDENTIFICATION  Patient Name: April [name] Birthdate: 02/19/1950 Sex: female Admission Date (Current Location): 03/02/2018  [facility] and [place] Number:  Herbalist and Address:  [facility] [place]. [facility], [place] [address] [place], [place] [address]      Provider Number: [id number]  Attending Physician Name and Address:  [name], MD  Relative Name and Phone Number:  [name] (YCXKGY)[phone]    Current Level of Care: Hospital Recommended Level of Care: [place] Prior Approval Number:    Date Approved/Denied: 04/15/15 PASRR Number: [id number]  Discharge Plan: SNF    Current Diagnoses: Patient Active Problem List   Diagnosis Date Noted  . Traumatic wound dehiscence   . Wound of right leg 02/18/2018  . Blister 02/18/2018  . Cellulitis of right leg 01/14/2018  . Skin lumps 01/14/2018  . Bilateral leg cramps 12/14/2017  . Dry skin dermatitis 06/29/2017  . Left shoulder pain 08/14/2016  . Meralgia paresthetica 07/16/2016  . Chronic left-sided low back pain with left-sided sciatica 06/02/2016  . Venous stasis ulcer ([place]) 05/13/2016  . Abscess of right lower leg 03/28/2016  . Whole body pain 03/20/2016  . Diabetic peripheral neuropathy ([place]) 03/20/2016  . Osteopenia 01/11/2016  . CKD (chronic kidney disease) stage 3, GFR 30-59 ml/min (HCC) 01/02/2016  . Hand paresthesia 07/18/2015  . Carpal tunnel syndrome 06/07/2015  . Family history of colon cancer   . Diabetes mellitus with neurological manifestations ([place]) 04/18/2015  . Bilateral leg edema 04/11/2015  . Cellulitis of leg, right 04/11/2015  . Abnormality of gait 01/03/2015  . Hereditary and idiopathic peripheral neuropathy 01/03/2015  . GERD (gastroesophageal reflux disease) 06/17/2014  . Morbid obesity with BMI of 50.0-59.9, adult ([place]) 09/05/2013  . Hx of colonic polyps 12/15/2012  . Vitamin B12 deficiency  11/03/2012  . Intrinsic asthma 03/23/2012  . Chronic diastolic heart failure ([place]) 02/20/2011  . OSA (obstructive sleep apnea) 09/17/2010  . URINARY URGENCY 01/08/2010  . CAD, NATIVE VESSEL 11/20/2008  . OSTEOARTHRITIS, KNEES, BILATERAL, SEVERE 06/14/2008  . Hyperlipidemia 05/10/2007  . Essential hypertension 01/18/2007  . HYPOKALEMIA 04/30/2006  . Body mass index 60.0-69.9, adult ([place]) 04/30/2006  . HX, PERSONAL, PEPTIC ULCER DISEASE 04/30/2006    Orientation RESPIRATION BLADDER Height & Weight     Self, Time, Situation, Place  O2(nasal cannula 2L/min) Continent, External catheter Weight: (!) 171 kg Height:  5\' 4"  (162.6 cm)  BEHAVIORAL SYMPTOMS/MOOD NEUROLOGICAL BOWEL NUTRITION STATUS      Continent Diet(see dc summary)  AMBULATORY STATUS COMMUNICATION OF NEEDS Skin   Extensive Assist Verbally Surgical wounds(Right leg closed surgical incision, wound vac)                       Personal Care Assistance Level of Assistance  Bathing, Feeding, Dressing, Total care Bathing Assistance: Maximum assistance Feeding assistance: Independent Dressing Assistance: Maximum assistance Total Care Assistance: Maximum assistance   Functional Limitations Info  Sight, Hearing, Speech Sight Info: Adequate Hearing Info: Adequate Speech Info: Adequate    SPECIAL CARE FACTORS FREQUENCY  PT (By licensed PT), OT (By licensed OT)     PT Frequency: min 5x weekly OT Frequency: min 5x weekly            Contractures Contractures Info: Not present    Additional Factors Info  Code Status, Allergies Code Status Info: full Allergies Info: Penicillins, Sulfonamide Derivatives, Benazepril Hcl, Dipyridamole, Estrogens, Hydrochlorothiazide, Hydrochlorothiazide  W-triamterence, Lotensin (benazepril Hcl), Metronidazole, Spironolactone, Sulfa Antibiotics, Torsemide, Triamterene-hctz, Valsartan            Current Medications (03/05/2018):  This is the current hospital active medication  list Current Facility-Administered Medications  Medication Dose Route Frequency Provider Last Rate Last Dose  . 0.9 %  sodium chloride infusion   Intravenous Continuous [name], MD   Stopped at 03/04/18 1030  . acetaminophen (TYLENOL) tablet 325-650 mg  325-650 mg Oral Q6H PRN [name], MD      . albuterol (PROVENTIL) (2.5 MG/3ML) 0.083% nebulizer solution 3 mL  3 mL Inhalation Q4H PRN [name], MD      . aspirin EC tablet 81 mg  81 mg Oral QHS [name], MD   81 mg at 03/04/18 2049  . bisacodyl (DULCOLAX) suppository 10 mg  10 mg Rectal Daily PRN [name], MD      . carvedilol (COREG) tablet 25 mg  25 mg Oral BID WC [name], MD   25 mg at 03/05/18 0835  . clindamycin (CLEOCIN) IVPB 600 mg  600 mg Intravenous Q8H [name], MD 100 mL/hr at 03/05/18 0456 600 mg at 03/05/18 0456  . docusate sodium (COLACE) capsule 100 mg  100 mg Oral BID [name], MD   100 mg at 03/05/18 0835  . famotidine (PEPCID) tablet 20 mg  20 mg Oral BID [name], MD   20 mg at 03/05/18 0488  . furosemide (LASIX) tablet 120 mg  120 mg Oral BID [name], MD   120 mg at 03/05/18 0834  . HYDROcodone-acetaminophen (NORCO/VICODIN) 5-325 MG per tablet 1 tablet  1 tablet Oral Q6H PRN [name], MD   1 tablet at 03/04/18 2050  . HYDROmorphone (DILAUDID) injection 0.5-1 mg  0.5-1 mg Intravenous Q4H PRN [name], MD   1 mg at 03/04/18 1510  . hydrOXYzine (ATARAX/VISTARIL) tablet 10 mg  10 mg Oral TID PRN [name], MD      . insulin aspart (novoLOG) injection 0-20 Units  0-20 Units Subcutaneous TID WC [name], MD   3 Units at 03/04/18 1713  . magnesium citrate solution 1 Bottle  1 Bottle Oral Once PRN [name], MD      . methocarbamol (ROBAXIN) tablet 500 mg  500 mg Oral Q6H PRN [name], MD   500 mg at 03/05/18 8916   Or  . methocarbamol (ROBAXIN) 500 mg in dextrose 5 % 50 mL IVPB  500 mg Intravenous Q6H PRN [name], MD      .  metoCLOPramide (REGLAN) tablet 5-10 mg  5-10 mg Oral Q8H PRN [name], MD       Or  . metoCLOPramide (REGLAN) injection 5-10 mg  5-10 mg Intravenous Q8H PRN [name], MD      . mometasone-formoterol [facility]) 200-5 MCG/ACT inhaler 2 puff  2 puff Inhalation BID [name], MD   2 puff at 03/05/18 [phone]  . ondansetron (ZOFRAN) tablet 4 mg  4 mg Oral Q6H PRN [name], MD       Or  . ondansetron [facility]) injection 4 mg  4 mg Intravenous Q6H PRN [name], MD   4 mg at 03/05/18 0253  . OXcarbazepine (TRILEPTAL) tablet 150 mg  150 mg Oral BID [name], MD   150 mg at 03/05/18 0840  . oxyCODONE (Oxy IR/ROXICODONE) immediate  release tablet 10-15 mg  10-15 mg Oral Q4H PRN [name], MD   15 mg at 03/05/18 8921  . oxyCODONE (Oxy IR/ROXICODONE) immediate release tablet 5-10 mg  5-10 mg Oral Q4H PRN [name], MD   10 mg at 03/04/18 2331  . polyethylene glycol (MIRALAX / GLYCOLAX) packet 17 g  17 g Oral Daily PRN [name], MD      . potassium chloride SA (K-DUR,KLOR-CON) CR tablet 60 mEq  60 mEq Oral Daily [name], MD   60 mEq at 03/05/18 0834  . simvastatin (ZOCOR) tablet 40 mg  40 mg Oral QHS [name], MD   40 mg at 03/04/18 2051  . spironolactone (ALDACTONE) tablet 25 mg  25 mg Oral Daily [name], MD   25 mg at 03/05/18 1941     Discharge Medications: Please see discharge summary for a list of discharge medications.  Relevant Imaging Results:  Relevant Lab Results:   Additional Information SSN: [id number]  Alberteen Sam, LCSW

## 2018-03-05 NOTE — Evaluation (Signed)
Occupational Therapy Evaluation Patient Details Name: LAQUANTA HUMMEL MRN: [id number] DOB: 30-Oct-1950 Today's Date: 03/05/2018    History of Present Illness 68 y.o. female admitted on 03/02/18 for R lower leg wound I and D, wound vac placement (origninal wound from fall on 02/15/18).  OR on 03/04/18 for closure, I & D and skin graft.  Pt with significant PMH of neuropathy, DM2, morbid obesity, HTN, CHF, cellulitis of bil LEs, CAD, asthma, anemia, and R knee arthroscopy.     Clinical Impression   Pt is a 68 yo female with above dx. Pt PTA: living with family, ambulating and requiring assist with ADL. Pt currently, in too severe of pain to tolerate more than sitting EOB. Pt limited by BLE weakness and severe pain. RN aware of pain. Pt moving RLE minimally at this time requiring MaxA for sitting EOB and required nearly immediate sitting EOB to supine from pain. Pt limited by weakness in BUEs and trunk. Pt [place] for UB ADL and MaxA for LB ADL. Pt would benefit from continued OT skilled services for ADL, mobility and safety in SNF setting. Next sessions to focus on mobility and ADLs with adaptive equipment.    Follow Up Recommendations  SNF;Supervision/Assistance - 24 hour    Equipment Recommendations  3 in 1 bedside commode(bariatric size 3 in 1)    Recommendations for Other Services PT consult(already ordered)     Precautions / Restrictions Precautions Precautions: Fall Restrictions Weight Bearing Restrictions: Yes RLE Weight Bearing: Weight bearing as tolerated      Mobility Bed Mobility Overal bed mobility: Needs Assistance Bed Mobility: Supine to Sit;Sit to Supine     Supine to sit: HOB elevated;Max assist;+2 for physical assistance;+2 for safety/equipment Sit to supine: Max assist;+2 for physical assistance;+2 for safety/equipment;HOB elevated   General bed mobility comments: Pt unable to assist due to severe weakness in BUEs/trunk and RLE and severe pain.  Transfers Overall  transfer level: Needs assistance               General transfer comment: unable to test; pt in too severe of pain to assist.    Balance Overall balance assessment: Needs assistance Sitting-balance support: Bilateral upper extremity supported;Feet supported Sitting balance-Leahy Scale: Poor Sitting balance - Comments: unable to tolerate sitting EOB requiring sit to supine immediately                                   ADL either performed or assessed with clinical judgement   ADL Overall ADL's : Needs assistance/impaired Eating/Feeding: Set up   Grooming: Sitting   Upper Body Bathing: Moderate assistance   Lower Body Bathing: Total assistance;+2 for physical assistance   Upper Body Dressing : Moderate assistance   Lower Body Dressing: Total assistance       Toileting- Clothing Manipulation and Hygiene: Total assistance;+2 for safety/equipment;+2 for physical assistance       Functional mobility during ADLs: (unable to test) General ADL Comments: Pt requiring increased assist for UB and LB ADL due to recent fall and severe weakness and severe pain.     Vision Baseline Vision/History: No visual deficits Patient Visual Report: No change from baseline Vision Assessment?: No apparent visual deficits     Perception     Praxis      Pertinent Vitals/Pain Pain Location: bil LEs R>L Pain Descriptors / Indicators: Throbbing     Hand Dominance Right   Extremity/Trunk Assessment  Lower Extremity Assessment RLE Deficits / Details: Weakness, inability to perform mobility with RLE due to severe pain. RLE: Unable to fully assess due to pain LLE Deficits / Details: s/p falls- bumping R knee, very stiff LLE Coordination: WNL       Communication Communication Communication: No difficulties   Cognition Arousal/Alertness: Awake/alert Behavior During Therapy: WFL for tasks assessed/performed Overall Cognitive Status: Within Functional Limits for  tasks assessed                                 General Comments: Pt fatigued easily    General Comments       Exercises     Shoulder Instructions      Home Living Family/patient expects to be discharged to:: Private residence Living Arrangements: Spouse/significant other;Children(son lives there too) Available Help at Discharge: Family;Available PRN/intermittently Type of Home: House Home Access: Stairs to enter [employer] of Steps: 4 Entrance Stairs-Rails: Left Home Layout: One level     Bathroom Shower/Tub: Teacher, early years/pre: Standard     Home Equipment: Environmental consultant - 2 wheels;Cane - quad;Wheelchair - manual          Prior Functioning/Environment Level of Independence: Needs assistance  Gait / Transfers Assistance Needed: with RW or cane, short distances.  WC for community access ADL's / Homemaking Assistance Needed: requires assist with ADL on a regular basis due to fatigue quickly.            OT Problem List: Decreased strength;Decreased range of motion;Decreased activity tolerance;Impaired balance (sitting and/or standing);Decreased coordination;Decreased safety awareness;Pain;Increased edema      OT Treatment/Interventions: Therapeutic exercise;Self-care/ADL training;Neuromuscular education;Energy conservation;Therapeutic activities;Patient/family education;Balance training    OT Goals(Current goals can be found in the care plan section) Acute Rehab OT Goals Patient Stated Goal: to decrease pain, get  some rest OT Goal Formulation: With patient Time For Goal Achievement: 03/19/18 Potential to Achieve Goals: Good ADL Goals Pt Will Perform Grooming: Independently;sitting Pt Will Perform Lower Body Dressing: with min assist;with adaptive equipment;sitting/lateral leans;bed level Pt Will Transfer to Toilet: with min assist;with +2 assist;bedside commode Additional ADL Goal #1: Pt will perform ADl functional transfers with  MinA +2 in order to increase independence with ADL mobility.  OT Frequency: Min 2X/week   Barriers to D/C: Decreased caregiver support          Co-evaluation              AM-PAC OT "6 Clicks" Daily Activity     Outcome Measure Help from another person eating meals?: None Help from another person taking care of personal grooming?: A Little Help from another person toileting, which includes using toliet, bedpan, or urinal?: Total Help from another person bathing (including washing, rinsing, drying)?: Total Help from another person to put on and taking off regular upper body clothing?: A Lot Help from another person to put on and taking off regular lower body clothing?: Total 6 Click Score: 12   End of Session Nurse Communication: Mobility status;Patient requests pain meds  Activity Tolerance: Treatment limited secondary to medical complications (Comment);Patient limited by pain Patient left: in bed;with call bell/phone within reach;with bed alarm set  OT Visit Diagnosis: Muscle weakness (generalized) (M62.81);Pain;Other abnormalities of gait and mobility (R26.89) Pain - Right/Left: Left Pain - part of body: Leg                Time: 3419-6222 OT Time Calculation (min): 46  min Charges:  OT General Charges $OT Visit: 1 Visit OT Evaluation $OT Eval Moderate Complexity: 1 Mod OT Treatments $Self Care/Home Management : 8-22 mins $Neuromuscular Re-education: 8-22 mins  Darryl Nestle) Marsa Aris OTR/L Acute Rehabilitation Services Pager: [phone] Office: [phone]   Fredda Hammed 03/05/2018, 12:44 PM

## 2018-03-06 LAB — GLUCOSE, CAPILLARY
Glucose-Capillary: 94 mg/dL (ref 70–99)
Glucose-Capillary: 128 mg/dL — ABNORMAL HIGH (ref 70–99)
Glucose-Capillary: 133 mg/dL — ABNORMAL HIGH (ref 70–99)
Glucose-Capillary: 117 mg/dL — ABNORMAL HIGH (ref 70–99)

## 2018-03-06 MED ORDER — MENTHOL 3 MG MT LOZG
1.0000 | LOZENGE | OROMUCOSAL | Status: DC | PRN
  Administered 2018-03-06 – 2018-03-07 (×2): 3 mg via ORAL
  Filled 2018-03-06: qty 9

## 2018-03-06 NOTE — Progress Notes (Signed)
   Subjective: 2 Days Post-Op Procedure(s) (LRB): REPEAT IRRIGATION AND DEBRIDEMENT RIGHT LEG, PLACE WOUND VAC (Right) POSSIBLE SPLIT THICKNESS SKIN GRAFT (Right) Patient reports pain as mild.    Objective: Vital signs in last 24 hours: Temp:  [97.9 F (36.6 C)-98.7 F (37.1 C)] 98.3 F (36.8 C) (02/02 0508) Pulse Rate:  [64-81] 81 (02/02 0918) Resp:  [16-20] 20 (02/02 0918) BP: (106-137)/(52-88) 134/88 (02/02 0918) SpO2:  [92 %-96 %] 96 % (02/02 0918)  Intake/Output from previous day: 02/01 0701 - 02/02 0700 In: 800 [P.O.:600; IV Piggyback:200] Out: 1975 [Urine:650; Drains:1325] Intake/Output this shift: No intake/output data recorded.  No results for input(s): HGB in the last 72 hours. No results for input(s): WBC, RBC, HCT, PLT in the last 72 hours. No results for input(s): NA, K, CL, CO2, BUN, CREATININE, GLUCOSE, CALCIUM in the last 72 hours. No results for input(s): LABPT, INR in the last 72 hours.  good VAC seal. No results found.  Assessment/Plan: 2 Days Post-Op Procedure(s) (LRB): REPEAT IRRIGATION AND DEBRIDEMENT RIGHT LEG, PLACE WOUND VAC (Right) POSSIBLE SPLIT THICKNESS SKIN GRAFT (Right) Plan; Continue VAC , Dr. [name] to discuss plan with her on Monday  April [name] 03/06/2018, 10:27 AM

## 2018-03-06 NOTE — Progress Notes (Addendum)
RN encouraged pt to get into chair today. Pt said she would think about it. RN explained all the benefits of getting out of the bed. RN will continue to encourage pt to move around and get OOB to chair.  This RN helped pt to edge of bed to transfer to bedside commode. Pt unable to tolerate standing. Pt assisted back into bed.

## 2018-03-07 ENCOUNTER — Encounter (HOSPITAL_COMMUNITY): Payer: Self-pay | Admitting: Orthopedic Surgery

## 2018-03-07 LAB — GLUCOSE, CAPILLARY
Glucose-Capillary: 90 mg/dL (ref 70–99)
Glucose-Capillary: 136 mg/dL — ABNORMAL HIGH (ref 70–99)
Glucose-Capillary: 108 mg/dL — ABNORMAL HIGH (ref 70–99)
Glucose-Capillary: 96 mg/dL (ref 70–99)

## 2018-03-07 NOTE — Progress Notes (Signed)
Physical Therapy Treatment Patient Details Name: April [name] MRN: [id number] DOB: 01-16-1951 Today's Date: 03/07/2018    History of Present Illness 68 y.o. female admitted on 03/02/18 for R lower leg wound I and D, wound vac placement (origninal wound from fall on 02/15/18).  Plan to return to the OR on 03/04/18 for closure and skin graft.  Pt with significant PMH of neuropathy, DM2, morbid obesity, HTN, CHF, cellulitis of bil LEs, CAD, asthma, anemia, and R knee arthroscopy.      PT Comments    Patient seen for mobility progression. Pt requires mod A +2 for bed mobility and functional transfers. Pt limited by LE pain.  Continue to progress as tolerated with anticipated d/c to SNF for further skilled PT services.    Follow Up Recommendations  SNF;Supervision/Assistance - 24 hour     Equipment Recommendations  None recommended by PT    Recommendations for Other Services       Precautions / Restrictions Precautions Precautions: Fall Required Braces or Orthoses: Other Brace Other Brace: wound vac R lower leg Restrictions Weight Bearing Restrictions: Yes RLE Weight Bearing: Weight bearing as tolerated    Mobility  Bed Mobility Overal bed mobility: Needs Assistance Bed Mobility: Supine to Sit     Supine to sit: HOB elevated;+2 for physical assistance;Mod assist     General bed mobility comments: assist to bring bilat LE and hips to EOB with use of bed pad and to elevate trunk into sitting; use of rails  Transfers Overall transfer level: Needs assistance Equipment used: Rolling walker (2 wheeled) Transfers: Sit to/from [facility] Sit to Stand: Mod assist;From elevated surface;+2 physical assistance Stand pivot transfers: Mod assist;+2 safety/equipment       General transfer comment: assist required to power up into standing from elevated bed height and for balance and managing RW for pivot to recliner; cues for upright posture and safe use of  AD  Ambulation/Gait                 Stairs             Wheelchair Mobility    Modified Rankin (Stroke Patients Only)       Balance Overall balance assessment: Needs assistance Sitting-balance support: Bilateral upper extremity supported;Feet supported Sitting balance-Leahy Scale: Poor     Standing balance support: Bilateral upper extremity supported Standing balance-Leahy Scale: Poor                              Cognition Arousal/Alertness: Awake/alert Behavior During Therapy: WFL for tasks assessed/performed Overall Cognitive Status: Within Functional Limits for tasks assessed                                        Exercises      General Comments        Pertinent Vitals/Pain Pain Assessment: Faces Faces Pain Scale: Hurts whole lot Pain Location: bil LEs R>L Pain Descriptors / Indicators: Aching;Guarding;Moaning Pain Intervention(s): Limited activity within patient's tolerance;Monitored during session;Repositioned    Home Living                      Prior Function            PT Goals (current goals can now be found in the care plan section) Progress towards PT goals: Progressing toward  goals    Frequency    Min 3X/week      PT Plan Current plan remains appropriate    Co-evaluation              AM-PAC PT "6 Clicks" Mobility   Outcome Measure  Help needed turning from your back to your side while in a flat bed without using bedrails?: A Lot Help needed moving from lying on your back to sitting on the side of a flat bed without using bedrails?: A Lot Help needed moving to and from a bed to a chair (including a wheelchair)?: A Lot Help needed standing up from a chair using your arms (e.g., wheelchair or bedside chair)?: A Lot Help needed to walk in hospital room?: A Lot Help needed climbing 3-5 steps with a railing? : Total 6 Click Score: 11    End of Session Equipment Utilized During  Treatment: Oxygen;Gait belt(2 L O2 [place]) Activity Tolerance: Patient limited by pain Patient left: with call bell/phone within reach;in chair Nurse Communication: Mobility status PT Visit Diagnosis: Muscle weakness (generalized) (M62.81);Difficulty in walking, not elsewhere classified (R26.2);Pain Pain - Right/Left: Right Pain - part of body: Leg     Time: 5670-1410 PT Time Calculation (min) (ACUTE ONLY): 34 min  Charges:  $Gait Training: 23-37 mins                     Earney Navy, PTA Acute Rehabilitation Services Pager: [phone] Office: [phone]     Darliss Cheney 03/07/2018, 1:24 PM

## 2018-03-07 NOTE — Progress Notes (Signed)
Pt used lozenge for her sore throat and started coughing. Lozenge expelled after vigorous coughing. Lozenges removed from room and pt told not to use them. She remains on Oxygen by [place] and after coughing her sats were 100%.

## 2018-03-07 NOTE — Progress Notes (Signed)
Patient ID: April [name], female   DOB: 06/26/1950, 68 y.o.   MRN: [id number] Patient is postoperative day 3 split-thickness skin graft right leg.  Patient is extremely slow with therapy with assistance she could stand at bedside.  Patient had respiratory depression with IV pain medication and this is discontinued.  Patient has 300 cc in the wound VAC canister.  She will need discharge with the Praveena plus portable wound VAC pump.

## 2018-03-08 ENCOUNTER — Inpatient Hospital Stay (HOSPITAL_COMMUNITY): Payer: Medicare Other

## 2018-03-08 DIAGNOSIS — Z881 Allergy status to other antibiotic agents status: Secondary | ICD-10-CM

## 2018-03-08 DIAGNOSIS — I878 Other specified disorders of veins: Secondary | ICD-10-CM

## 2018-03-08 DIAGNOSIS — Z872 Personal history of diseases of the skin and subcutaneous tissue: Secondary | ICD-10-CM

## 2018-03-08 DIAGNOSIS — Z9889 Other specified postprocedural states: Secondary | ICD-10-CM

## 2018-03-08 DIAGNOSIS — I890 Lymphedema, not elsewhere classified: Secondary | ICD-10-CM

## 2018-03-08 DIAGNOSIS — R509 Fever, unspecified: Secondary | ICD-10-CM

## 2018-03-08 DIAGNOSIS — Z882 Allergy status to sulfonamides status: Secondary | ICD-10-CM

## 2018-03-08 DIAGNOSIS — J9811 Atelectasis: Secondary | ICD-10-CM

## 2018-03-08 DIAGNOSIS — Z888 Allergy status to other drugs, medicaments and biological substances status: Secondary | ICD-10-CM

## 2018-03-08 DIAGNOSIS — Z880 Allergy status to penicillin: Secondary | ICD-10-CM

## 2018-03-08 LAB — RENAL FUNCTION PANEL
Albumin: 2.2 g/dL — ABNORMAL LOW (ref 3.5–5.0)
Anion gap: 9 (ref 5–15)
BUN: 26 mg/dL — ABNORMAL HIGH (ref 8–23)
CO2: 34 mmol/L — ABNORMAL HIGH (ref 22–32)
Calcium: 8.4 mg/dL — ABNORMAL LOW (ref 8.9–10.3)
Chloride: 96 mmol/L — ABNORMAL LOW (ref 98–111)
Creatinine, Ser: 1.43 mg/dL — ABNORMAL HIGH (ref 0.44–1.00)
GFR calc Af Amer: 44 mL/min — ABNORMAL LOW (ref >60)
GFR calc non Af Amer: 38 mL/min — ABNORMAL LOW (ref >60)
Glucose, Bld: 155 mg/dL — ABNORMAL HIGH (ref 70–99)
Phosphorus: 3.7 mg/dL (ref 2.5–4.6)
Potassium: 3.9 mmol/L (ref 3.5–5.1)
Sodium: 139 mmol/L (ref 135–145)

## 2018-03-08 LAB — URINALYSIS, ROUTINE W REFLEX MICROSCOPIC
Bilirubin Urine: NEGATIVE
Glucose, UA: NEGATIVE mg/dL
Hgb urine dipstick: NEGATIVE
Ketones, ur: NEGATIVE mg/dL
Leukocytes, UA: NEGATIVE
Nitrite: POSITIVE — AB
Protein, ur: NEGATIVE mg/dL
Specific Gravity, Urine: 1.013 (ref 1.005–1.030)
pH: 6 (ref 5.0–8.0)

## 2018-03-08 LAB — GLUCOSE, CAPILLARY
GLUCOSE-CAPILLARY: 108 mg/dL — AB (ref 70–99)
Glucose-Capillary: 85 mg/dL (ref 70–99)
Glucose-Capillary: 126 mg/dL — ABNORMAL HIGH (ref 70–99)
Glucose-Capillary: 112 mg/dL — ABNORMAL HIGH (ref 70–99)

## 2018-03-08 LAB — CBC WITH DIFFERENTIAL/PLATELET
Abs Immature Granulocytes: 0.05 10*3/uL (ref 0.00–0.07)
Abs Immature Granulocytes: 0.04 10*3/uL (ref 0.00–0.07)
Basophils Absolute: 0.1 10*3/uL (ref 0.0–0.1)
Basophils Absolute: 0.1 10*3/uL (ref 0.0–0.1)
Basophils Relative: 1 %
Basophils Relative: 1 %
Eosinophils Absolute: 0.2 10*3/uL (ref 0.0–0.5)
Eosinophils Absolute: 0.2 10*3/uL (ref 0.0–0.5)
Eosinophils Relative: 4 %
Eosinophils Relative: 3 %
HCT: 31.4 % — ABNORMAL LOW (ref 36.0–46.0)
HCT: 30.5 % — ABNORMAL LOW (ref 36.0–46.0)
Hemoglobin: 9.4 g/dL — ABNORMAL LOW (ref 12.0–15.0)
Hemoglobin: 9.5 g/dL — ABNORMAL LOW (ref 12.0–15.0)
Immature Granulocytes: 1 %
Immature Granulocytes: 1 %
Lymphocytes Relative: 17 %
Lymphocytes Relative: 15 %
Lymphs Abs: 1 10*3/uL (ref 0.7–4.0)
Lymphs Abs: 1 10*3/uL (ref 0.7–4.0)
MCH: 28.9 pg (ref 26.0–34.0)
MCH: 30 pg (ref 26.0–34.0)
MCHC: 29.9 g/dL — ABNORMAL LOW (ref 30.0–36.0)
MCHC: 31.1 g/dL (ref 30.0–36.0)
MCV: 96.6 fL (ref 80.0–100.0)
MCV: 96.2 fL (ref 80.0–100.0)
MONO ABS: 0.6 10*3/uL (ref 0.1–1.0)
Monocytes Absolute: 0.6 10*3/uL (ref 0.1–1.0)
Monocytes Relative: 10 %
Monocytes Relative: 8 %
Neutro Abs: 4.1 10*3/uL (ref 1.7–7.7)
Neutro Abs: 4.8 10*3/uL (ref 1.7–7.7)
Neutrophils Relative %: 67 %
Neutrophils Relative %: 72 %
Platelets: 224 10*3/uL (ref 150–400)
Platelets: 226 10*3/uL (ref 150–400)
RBC: 3.25 MIL/uL — ABNORMAL LOW (ref 3.87–5.11)
RBC: 3.17 MIL/uL — ABNORMAL LOW (ref 3.87–5.11)
RDW: 14.5 % (ref 11.5–15.5)
RDW: 14.4 % (ref 11.5–15.5)
WBC: 6 10*3/uL (ref 4.0–10.5)
WBC: 6.6 10*3/uL (ref 4.0–10.5)
nRBC: 0.5 % — ABNORMAL HIGH (ref 0.0–0.2)
nRBC: 0.5 % — ABNORMAL HIGH (ref 0.0–0.2)

## 2018-03-08 LAB — EXPECTORATED SPUTUM ASSESSMENT W GRAM STAIN, RFLX TO RESP C

## 2018-03-08 LAB — PROCALCITONIN: Procalcitonin: <0.1 ng/mL

## 2018-03-08 MED ORDER — SODIUM CHLORIDE 0.9 % IV SOLN
1.0000 g | INTRAVENOUS | Status: DC
  Filled 2018-03-08 (×2): qty 10

## 2018-03-08 MED ORDER — POTASSIUM CHLORIDE 20 MEQ PO PACK
60.0000 meq | PACK | Freq: Every day | ORAL | Status: DC
  Administered 2018-03-09: 60 meq via ORAL
  Filled 2018-03-08 (×2): qty 3

## 2018-03-08 NOTE — Progress Notes (Signed)
Pharmacy Antibiotic Note  April [name] is a 68 y.o. female admitted on 03/02/2018 with UTI.  Pharmacy has been consulted for Rocephin dosing. Patient has documented allergy to penicillins. But looks like she has tolerated cefpodoxime in the past.   Plan: Ceftriaxone 1 gram IV q 24 hours Monitor clinical progress, cultures/sensitivities, renal function, abx plan Monitor for signs/symptoms of allergic reaction   Height: 5\' 4"  (162.6 cm) Weight: (!) 377 lb (171 kg) IBW/kg (Calculated) : 54.7  Temp (24hrs), Avg:99 F (37.2 C), Min:97.5 F (36.4 C), Max:100.4 F (38 C)  Recent Labs  Lab 03/02/18 0758 03/08/18 0821 03/08/18 0924  WBC 6.5 6.0 6.6  CREATININE 1.47*  --  1.43*    Estimated Creatinine Clearance: 61 mL/min (A) (by C-G formula based on SCr of 1.43 mg/dL (H)).    Allergies  Allergen Reactions  . Penicillins Rash and Other (See Comments)    She was told not to take it anymore. Has patient had a PCN reaction causing immediate rash, facial/tongue/throat swelling, SOB or lightheadedness with hypotension: Yes Has patient had a PCN reaction causing severe rash involving mucus membranes or skin necrosis: No Has patient had a PCN reaction that required hospitalization: No Has patient had a PCN reaction occurring within the last 10 years: No If all of the above answers are "NO", then may proceed with Cephalosporin use.'  . Sulfonamide Derivatives Anaphylaxis and Other (See Comments)    REACTION: internal "burning"  . Benazepril Hcl Other (See Comments)    Patient unsure of reaction.  . Dipyridamole Other (See Comments)    Unknown  . Estrogens Other (See Comments)    Unknown  . Hydrochlorothiazide Other (See Comments)    Unknown  . Hydrochlorothiazide W-Triamterene Other (See Comments)    Hypokalemia  . Lotensin [Benazepril Hcl] Other (See Comments)    Unknown  . Metronidazole Other (See Comments)    Patient unsure of reaction.  Marland Kitchen Spironolactone Other (See Comments)     "kidney problems"  . Sulfa Antibiotics Other (See Comments)    Unknown  . Torsemide Other (See Comments)    Patient unsure of reaction.  . Triamterene-Hctz Other (See Comments)    Unknown  . Valsartan Other (See Comments)    Patient unsure of reaction.    Antimicrobials this admission: 2/4 ceftriaxone >>   Dose adjustments this admission:  Microbiology results: 2/4 BCx: sent 2/4 UCx: sent  2/4 Sputum: pending  1/31 MRSA PCR: negative   Thank you for allowing [place] to participate in this patients care.   Jens Som, PharmD Please utilize [facility] (under [place]) for appropriate number for your unit pharmacist. 03/08/2018 5:53 PM

## 2018-03-08 NOTE — Progress Notes (Addendum)
Subjective: 4 Days Post-Op Procedure(s) (LRB): REPEAT IRRIGATION AND DEBRIDEMENT RIGHT LEG, PLACE WOUND VAC (Right) POSSIBLE SPLIT THICKNESS SKIN GRAFT (Right) Patient reports pain as moderate.   Still on oxygen by nasal cannula this am.  Fever to 100.4 .  Will check urine, CXR today.  Multiple medical issues, so will get medicine consult to help with management.   Objective: Vital signs in last 24 hours: Temp:  [97.5 F (36.4 C)-100.4 F (38 C)] 100.3 F (37.9 C) (02/04 0351) Pulse Rate:  [82-99] 87 (02/04 0351) Resp:  [14-20] 18 (02/04 0351) BP: (107-126)/(56-74) 126/74 (02/04 0351) SpO2:  [96 %-99 %] 99 % (02/04 0351)  Intake/Output from previous day: 02/03 0701 - 02/04 0700 In: 1130 [P.O.:1080; IV Piggyback:50] Out: 2200 [Urine:2150; Drains:50] Intake/Output this shift: No intake/output data recorded.  No results for input(s): HGB in the last 72 hours. No results for input(s): WBC, RBC, HCT, PLT in the last 72 hours. No results for input(s): NA, K, CL, CO2, BUN, CREATININE, GLUCOSE, CALCIUM in the last 72 hours. No results for input(s): LABPT, INR in the last 72 hours.  Right lower leg with VAC dressing intact and functioning well. VAC canister to 375 cc (300 cc yesterday) .  Patient remains on oxygen by nasal cannula.   Assessment/Plan: 4 Days Post-Op Procedure(s) (LRB): REPEAT IRRIGATION AND DEBRIDEMENT RIGHT LEG, PLACE WOUND VAC (Right) POSSIBLE SPLIT THICKNESS SKIN GRAFT (Right) Discharge to SNF with Prevena Plus VAC canister.  Will try to wean oxygen.  Fever- will check CBC, urine and CXR.  Medicine consult to help with multiple medical issues.    Erlinda Hong, PA-C 03/08/2018, 7:29 AM  [phone]

## 2018-03-08 NOTE — Care Management Important Message (Signed)
Important Message  Patient Details  Name: April [name] MRN: [id number] Date of Birth: Sep 24, 1950   Medicare Important Message Given:  Yes    Darvis Croft [address] 03/08/2018, 3:53 PM

## 2018-03-08 NOTE — Consult Note (Signed)
Medical Consultation   April [name]  WUJ:[id number]  DOB: [birth year]  DOA: 03/02/2018  PCP: [name], MD   Outpatient Specialists:   Requesting physician: Dr. [name], Orthopedic surgeon.  Reason for consultation: Postop fever.   History of Present Illness: April [name] is an 68 y.o. female, morbidly obese, with documented BMI of 64.71 kg/m, with multiple medical and cardiac problems.  Patient carries diagnosis of coronary artery disease, congestive heart failure, diabetes mellitus, hypertension, leg ulcers, PUD, OSA, peripheral neuropathy and cellulitis amongst other medical problems.  Patient underwent repeat irrigation and debridement of right leg skin, soft tissue, muscle and fascial layers, with placement of wound VAC, split thickness skin graft and local tissue rearrangement for wound closure on 03/04/2018.  Hospitalist service was consulted today to assist with management of fever.  100.8 F was noted last night.  Patient reported cough that is productive of yellowish-greenish sputum.  No pleuritic chest pain.  No headache, no neck pain, no GI symptoms and no urinary symptoms.  Chest x-ray done revealed atelectasis.  Review of Systems:  ROS As per HPI otherwise 10 point review of systems negative.    Past Medical History: Past Medical History:  Diagnosis Date  . Anemia   . Asthma   . CAD (coronary artery disease)   . Carpal tunnel syndrome   . Cellulitis of both lower extremities 04/11/2015  . CHF (congestive heart failure) ([place])   . Colon polyp, hyperplastic 2007 & 2012  . Complication of anesthesia 1999   svt with renal calculi surgery, no problems since  . CTS (carpal tunnel syndrome)   . Diabetes mellitus   . Eczema   . GERD (gastroesophageal reflux disease)   . Hyperlipidemia   . Hypertension   . Leg ulcer ([place]) 04/24/2015   Right lateral leg No evidence of an infection Monitor closely Keep edema controlled   . Meralgia paresthetica     Dr. [name]  . Morbid obesity ([place])   . Morbid obesity ([place])   . Neuropathy    toes and legs  . Osteoarthrosis, unspecified whether generalized or localized, lower leg   . PUD (peptic ulcer disease)   . Shortness of breath dyspnea    with exertion  . Sleep apnea    per progress note 02/25/2018  . Type II or unspecified type diabetes mellitus without mention of complication, not stated as uncontrolled   . Unspecified hereditary and idiopathic peripheral neuropathy   . Vitamin B12 deficiency   . Wears glasses   . Wound cellulitis    right upper leg, healing well    Past Surgical History: Past Surgical History:  Procedure Laterality Date  . ABDOMINAL HYSTERECTOMY    . CARDIAC CATHETERIZATION  2002   non obstructive disease  . colonoscopy with polypectomy  2007 & 2012    hyperplastic ;Dr Watt Climes  . COLONOSCOPY WITH PROPOFOL N/A 06/04/2015   Procedure: COLONOSCOPY WITH PROPOFOL;  Surgeon: Jerene Bears, MD;  Location: [facility];  Service: Gastroenterology;  Laterality: N/A;  . DEBRIDEMENT LEG Right 03/02/2018   WOUND VAC APPLIED  . DILATION AND CURETTAGE OF UTERUS     multiple  . HEMORRHOID SURGERY    . I&D EXTREMITY Right 03/02/2018   Procedure: RIGHT LEG DEBRIDEMENT AND PLACE VAC;  Surgeon: [name], MD;  Location: [facility];  Service: Orthopedics;  Laterality: Right;  . I&D EXTREMITY Right 03/04/2018  Procedure: REPEAT IRRIGATION AND DEBRIDEMENT RIGHT LEG, PLACE WOUND VAC;  Surgeon: [name], MD;  Location: [facility];  Service: Orthopedics;  Laterality: Right;  . renal calculi  12/1997   SVT with induction of anesthesia  . right knee arthroscopy    . SKIN SPLIT GRAFT Right 03/04/2018   Procedure: POSSIBLE SPLIT THICKNESS SKIN GRAFT;  Surgeon: [name], MD;  Location: [facility];  Service: Orthopedics;  Laterality: Right;  . TONSILLECTOMY AND ADENOIDECTOMY       Allergies:   Allergies  Allergen Reactions  . Penicillins Rash and Other (See Comments)    She was told not  to take it anymore. Has patient had a PCN reaction causing immediate rash, facial/tongue/throat swelling, SOB or lightheadedness with hypotension: Yes Has patient had a PCN reaction causing severe rash involving mucus membranes or skin necrosis: No Has patient had a PCN reaction that required hospitalization: No Has patient had a PCN reaction occurring within the last 10 years: No If all of the above answers are "NO", then may proceed with Cephalosporin use.'  . Sulfonamide Derivatives Anaphylaxis and Other (See Comments)    REACTION: internal "burning"  . Benazepril Hcl Other (See Comments)    Patient unsure of reaction.  . Dipyridamole Other (See Comments)    Unknown  . Estrogens Other (See Comments)    Unknown  . Hydrochlorothiazide Other (See Comments)    Unknown  . Hydrochlorothiazide W-Triamterene Other (See Comments)    Hypokalemia  . Lotensin [Benazepril Hcl] Other (See Comments)    Unknown  . Metronidazole Other (See Comments)    Patient unsure of reaction.  Marland Kitchen Spironolactone Other (See Comments)    "kidney problems"  . Sulfa Antibiotics Other (See Comments)    Unknown  . Torsemide Other (See Comments)    Patient unsure of reaction.  . Triamterene-Hctz Other (See Comments)    Unknown  . Valsartan Other (See Comments)    Patient unsure of reaction.     Social History:  reports that she has never smoked. She has never used smokeless tobacco. She reports that she does not drink alcohol or use drugs.   Family History: Family History  Problem Relation Age of Onset  . Colon cancer Mother   . Prostate cancer Father   . Colon cancer Father   . Diabetes Maternal Aunt   . Breast cancer Maternal Aunt   . Diabetes Maternal Uncle   . Diabetes Paternal Aunt   . Stroke Paternal Aunt        > 65  . Heart disease Paternal Aunt   . Diabetes Paternal Uncle   . Breast cancer Maternal Aunt         X 2  . Breast cancer Cousin      Physical Exam: Vitals:   03/07/18 1930  03/07/18 2054 03/08/18 0222 03/08/18 0351  BP: 123/64   126/74  Pulse: 91   87  Resp: 14   18  Temp: (!) 100.4 F (38 C)  (!) 97.5 F (36.4 C) 100.3 F (37.9 C)  TempSrc: Oral  Oral Oral  SpO2: 98% 96%  99%  Weight:      Height:        Constitutional: Alert and awake, oriented x3, not in any acute distress. Patient is morbidly obese. Eyes: Pallor.  ENMT: external ears and nose appear normal,             Lips appears normal, oropharynx mucosa, tongue, posterior pharynx appear normal  CVS: S1-S2   Respiratory:  clear to auscultation  Abdomen: Morbidly obese, soft and nontender.  Organs are difficult to assess. Musculoskeletal: Wound VAC to right lower extremity.                       Neuro: Patient moves all limbs.  Patient is awake and alert.   Data reviewed:  I have personally reviewed following labs and imaging studies  Labs:  CBC: Recent Labs  Lab 03/02/18 0758  WBC 6.5  HGB 10.0*  HCT 33.3*  MCV 98.2  PLT 202    Basic Metabolic Panel: Recent Labs  Lab 03/02/18 0758  NA 138  K 4.1  CL 98  CO2 28  GLUCOSE 112*  BUN 26*  CREATININE 1.47*  CALCIUM 8.7*   GFR Estimated Creatinine Clearance: 59.3 mL/min (A) (by C-G formula based on SCr of 1.47 mg/dL (H)). Liver Function Tests: No results for input(s): AST, ALT, ALKPHOS, BILITOT, PROT, ALBUMIN in the last 168 hours. No results for input(s): LIPASE, AMYLASE in the last 168 hours. No results for input(s): AMMONIA in the last 168 hours. Coagulation profile No results for input(s): INR, PROTIME in the last 168 hours.  Cardiac Enzymes: No results for input(s): CKTOTAL, CKMB, CKMBINDEX, TROPONINI in the last 168 hours. BNP: Invalid input(s): POCBNP CBG: Recent Labs  Lab 03/07/18 0704 03/07/18 1145 03/07/18 1653 03/07/18 2149 03/08/18 0603  GLUCAP 90 136* 108* 96 85   D-Dimer No results for input(s): DDIMER in the last 72 hours. Hgb A1c No results for input(s): HGBA1C in the last 72 hours. Lipid  Profile No results for input(s): CHOL, HDL, LDLCALC, TRIG, CHOLHDL, LDLDIRECT in the last 72 hours. Thyroid function studies No results for input(s): TSH, T4TOTAL, T3FREE, THYROIDAB in the last 72 hours.  Invalid input(s): FREET3 Anemia work up No results for input(s): VITAMINB12, FOLATE, FERRITIN, TIBC, IRON, RETICCTPCT in the last 72 hours. Urinalysis    Component Value Date/Time   COLORURINE AMBER (A) 04/12/2015 0844   APPEARANCEUR CLEAR 04/12/2015 0844   LABSPEC 1.020 04/12/2015 0844   PHURINE 5.0 04/12/2015 0844   GLUCOSEU NEGATIVE 04/12/2015 0844   GLUCOSEU NEGATIVE 02/19/2015 1707   HGBUR NEGATIVE 04/12/2015 0844   BILIRUBINUR NEGATIVE 04/12/2015 0844   BILIRUBINUR negative 09/05/2013 0832   KETONESUR NEGATIVE 04/12/2015 0844   PROTEINUR NEGATIVE 04/12/2015 0844   UROBILINOGEN 2.0 (A) 02/19/2015 1707   NITRITE NEGATIVE 04/12/2015 0844   LEUKOCYTESUR NEGATIVE 04/12/2015 0844     Sepsis Labs Invalid input(s): PROCALCITONIN,  WBC,  LACTICIDVEN Microbiology Recent Results (from the past 240 hour(s))  Surgical pcr screen     Status: None   Collection Time: 03/04/18 12:31 AM  Result Value Ref Range Status   MRSA, PCR NEGATIVE NEGATIVE Final   Staphylococcus aureus NEGATIVE NEGATIVE Final    Comment: (NOTE) The Xpert SA Assay (FDA approved for NASAL specimens in patients 87 years of age and older), is one component of a comprehensive surveillance program. It is not intended to diagnose infection nor to guide or monitor treatment. Performed at [facility], [place] [address], [place], [place] [address]        Inpatient Medications:   Scheduled Meds: . aspirin EC  81 mg Oral QHS  . carvedilol  25 mg Oral BID WC  . docusate sodium  100 mg Oral BID  . famotidine  20 mg Oral BID  . furosemide  120 mg Oral BID  . insulin aspart  0-20 Units  Subcutaneous TID WC  . mometasone-formoterol  2 puff Inhalation BID  . OXcarbazepine  150 mg Oral BID  . potassium  chloride SA  60 mEq Oral Daily  . simvastatin  40 mg Oral QHS  . spironolactone  25 mg Oral Daily   Continuous Infusions: . sodium chloride Stopped (03/04/18 1030)  . methocarbamol (ROBAXIN) IV       Radiological Exams on Admission: No results found.  Impression/Recommendations Active Problems:   Abscess of right lower leg   Traumatic wound dehiscence  Assessment and plan: Postop fever: Panculture patient. Chest x-ray reveals atelectasis. Flutter valve device. Chest PT. Check procalcitonin Further management will depend on hospital course.  Thank you for this consultation.  Our [facility] hospitalist team will follow the patient with you.   Time Spent: 65 minutes.  Dana Allan, MD  [facility] Pager #: [phone] 7PM-7AM contact night coverage as above

## 2018-03-08 NOTE — Plan of Care (Signed)

## 2018-03-08 NOTE — Consult Note (Signed)
Date of Admission:  03/02/2018          Reason for Consult: FUO   Referring Provider: Dr. [name]   Assessment:  1. Low grade fevers--suspect due to atelectasis vs infection of wound  2. MOrbid obesity 3. Recent wound status post surgeries and skin grafting 4. Venous stasis changes and history of superimposed cellulitis at times  Plan:  1. Observe off antibiotics 2. She should use the incentive spirometer and if she can move more that might help with her atelectasis  Active Problems:   Abscess of right lower leg   Traumatic wound dehiscence   Scheduled Meds: . aspirin EC  81 mg Oral QHS  . carvedilol  25 mg Oral BID WC  . docusate sodium  100 mg Oral BID  . famotidine  20 mg Oral BID  . furosemide  120 mg Oral BID  . insulin aspart  0-20 Units Subcutaneous TID WC  . mometasone-formoterol  2 puff Inhalation BID  . OXcarbazepine  150 mg Oral BID  . potassium chloride SA  60 mEq Oral Daily  . simvastatin  40 mg Oral QHS  . spironolactone  25 mg Oral Daily   Continuous Infusions: . sodium chloride Stopped (03/04/18 1030)  . methocarbamol (ROBAXIN) IV     PRN Meds:.acetaminophen, acetaminophen-codeine, albuterol, bisacodyl, HYDROcodone-acetaminophen, hydrOXYzine, menthol-cetylpyridinium, methocarbamol **OR** methocarbamol (ROBAXIN) IV, metoCLOPramide **OR** metoCLOPramide (REGLAN) injection, ondansetron **OR** ondansetron (ZOFRAN) IV, oxyCODONE, oxyCODONE, polyethylene glycol  HPI: April [name] is a 68 y.o. female who is morbidly obese and has had some episodes of cellulitis superimposed on her chronic lymphedema, who sustained a fall on her hardwood floor in early January.  She ultimately had to be evacuated by EMS and was seen in the ER where Steri-Strips strips were applied.  Copious bleeding unfortunately was still going on and she ultimately needed to have debridement on 29 January followed by surgery on the 31st with placement of a skin graft.  She was on  clindamycin initially but then this was discontinued a few days ago.  She did develop low-grade temperatures postoperatively including while she was on clindamycin with temperatures of 0.8 100.4-100.3 in the last few days.  She is receiving Tylenol.  She is been coughing some and is fairly bedbound and has atelectasis on chest x-ray.  I would suspect that her fevers are either from atelectasis or that her wound is indeed infected.  The prudent thing to do is to continue to observe her off antibiotics.  If she can use her incentive spirometer and do other things to get more mobile this will help with her atelectasis.  I will screen her for HIV again.   Review of Systems: Review of Systems  Constitutional: Negative for chills, diaphoresis, fever, malaise/fatigue and weight loss.  HENT: Negative for congestion, hearing loss, sore throat and tinnitus.   Eyes: Negative for blurred vision and double vision.  Respiratory: Positive for cough and sputum production. Negative for shortness of breath and wheezing.   Cardiovascular: Negative for chest pain, palpitations and leg swelling.  Gastrointestinal: Negative for abdominal pain, blood in stool, constipation, diarrhea, heartburn, melena, nausea and vomiting.  Genitourinary: Negative for dysuria, flank pain and hematuria.  Musculoskeletal: Negative for back pain, falls, joint pain and myalgias.  Skin: Negative for itching and rash.  Neurological: Negative for dizziness, sensory change, focal weakness, loss of consciousness, weakness and headaches.  Endo/Heme/Allergies: Does not bruise/bleed easily.  Psychiatric/Behavioral: Negative for depression, memory loss and suicidal ideas.  The patient is not nervous/anxious.     Past Medical History:  Diagnosis Date  . Anemia   . Asthma   . CAD (coronary artery disease)   . Carpal tunnel syndrome   . Cellulitis of both lower extremities 04/11/2015  . CHF (congestive heart failure) ([place])   . Colon  polyp, hyperplastic 2007 & 2012  . Complication of anesthesia 1999   svt with renal calculi surgery, no problems since  . CTS (carpal tunnel syndrome)   . Diabetes mellitus   . Eczema   . GERD (gastroesophageal reflux disease)   . Hyperlipidemia   . Hypertension   . Leg ulcer ([place]) 04/24/2015   Right lateral leg No evidence of an infection Monitor closely Keep edema controlled   . Meralgia paresthetica    Dr. [name]  . Morbid obesity ([place])   . Morbid obesity ([place])   . Neuropathy    toes and legs  . Osteoarthrosis, unspecified whether generalized or localized, lower leg   . PUD (peptic ulcer disease)   . Shortness of breath dyspnea    with exertion  . Sleep apnea    per progress note 02/25/2018  . Type II or unspecified type diabetes mellitus without mention of complication, not stated as uncontrolled   . Unspecified hereditary and idiopathic peripheral neuropathy   . Vitamin B12 deficiency   . Wears glasses   . Wound cellulitis    right upper leg, healing well    Social History   Tobacco Use  . Smoking status: Never Smoker  . Smokeless tobacco: Never Used  Substance Use Topics  . Alcohol use: No    Alcohol/week: 0.0 standard drinks  . Drug use: No    Family History  Problem Relation Age of Onset  . Colon cancer Mother   . Prostate cancer Father   . Colon cancer Father   . Diabetes Maternal Aunt   . Breast cancer Maternal Aunt   . Diabetes Maternal Uncle   . Diabetes Paternal Aunt   . Stroke Paternal Aunt        > 65  . Heart disease Paternal Aunt   . Diabetes Paternal Uncle   . Breast cancer Maternal Aunt         X 2  . Breast cancer Cousin    Allergies  Allergen Reactions  . Penicillins Rash and Other (See Comments)    She was told not to take it anymore. Has patient had a PCN reaction causing immediate rash, facial/tongue/throat swelling, SOB or lightheadedness with hypotension: Yes Has patient had a PCN reaction causing severe rash involving mucus  membranes or skin necrosis: No Has patient had a PCN reaction that required hospitalization: No Has patient had a PCN reaction occurring within the last 10 years: No If all of the above answers are "NO", then may proceed with Cephalosporin use.'  . Sulfonamide Derivatives Anaphylaxis and Other (See Comments)    REACTION: internal "burning"  . Benazepril Hcl Other (See Comments)    Patient unsure of reaction.  . Dipyridamole Other (See Comments)    Unknown  . Estrogens Other (See Comments)    Unknown  . Hydrochlorothiazide Other (See Comments)    Unknown  . Hydrochlorothiazide W-Triamterene Other (See Comments)    Hypokalemia  . Lotensin [Benazepril Hcl] Other (See Comments)    Unknown  . Metronidazole Other (See Comments)    Patient unsure of reaction.  Marland Kitchen Spironolactone Other (See Comments)    "kidney problems"  . Sulfa  Antibiotics Other (See Comments)    Unknown  . Torsemide Other (See Comments)    Patient unsure of reaction.  . Triamterene-Hctz Other (See Comments)    Unknown  . Valsartan Other (See Comments)    Patient unsure of reaction.    OBJECTIVE: Blood pressure (!) 104/52, pulse 74, temperature 98.4 F (36.9 C), temperature source Oral, resp. rate 17, height 5\' 4"  (1.626 m), weight (!) 171 kg, SpO2 95 %.  Physical Exam Constitutional:      General: She is not in acute distress.    Appearance: Normal appearance. She is well-developed. She is not ill-appearing or diaphoretic.  HENT:     Head: Normocephalic and atraumatic.     Right Ear: Hearing and external ear normal.     Left Ear: Hearing and external ear normal.     Nose: No nasal deformity or rhinorrhea.  Eyes:     General: No scleral icterus.    Conjunctiva/sclera: Conjunctivae normal.     Right eye: Right conjunctiva is not injected.     Left eye: Left conjunctiva is not injected.     Pupils: Pupils are equal, round, and reactive to light.  Neck:     Musculoskeletal: Normal range of motion and neck  supple.     Vascular: No JVD.  Cardiovascular:     Rate and Rhythm: Normal rate and regular rhythm.     Heart sounds: Normal heart sounds, S1 normal and S2 normal. No murmur. No friction rub. No gallop.   Pulmonary:     Effort: Pulmonary effort is normal. No respiratory distress.     Breath sounds: Normal breath sounds. No stridor. No wheezing, rhonchi or rales.  Abdominal:     General: Abdomen is flat. Bowel sounds are normal. There is no distension.     Palpations: Abdomen is soft. There is no mass.     Tenderness: There is no abdominal tenderness.     Hernia: No hernia is present.  Musculoskeletal: Normal range of motion.     Right shoulder: Normal.     Left shoulder: Normal.     Right hip: Normal.     Left hip: Normal.     Right knee: Normal.     Left knee: Normal.  Lymphadenopathy:     Head:     Right side of head: No submandibular, preauricular or posterior auricular adenopathy.     Left side of head: No submandibular, preauricular or posterior auricular adenopathy.     Cervical: No cervical adenopathy.     Right cervical: No superficial or deep cervical adenopathy.    Left cervical: No superficial or deep cervical adenopathy.  Skin:    General: Skin is warm and dry.     Coloration: Skin is not jaundiced or pale.     Findings: No abrasion, bruising, ecchymosis, erythema, lesion or rash.     Nails: There is no clubbing.   Neurological:     General: No focal deficit present.     Mental Status: She is alert and oriented to person, place, and time.     Sensory: No sensory deficit.     Coordination: Coordination normal.  Psychiatric:        Attention and Perception: She is attentive.        Mood and Affect: Mood normal.        Speech: Speech normal.        Behavior: Behavior normal. Behavior is cooperative.        Thought Content:  Thought content normal.        Judgment: Judgment normal.   Both legs are wrapped with bandages right with a vacuum dressing Lab  Results Lab Results  Component Value Date   WBC 6.6 03/08/2018   HGB 9.5 (L) 03/08/2018   HCT 30.5 (L) 03/08/2018   MCV 96.2 03/08/2018   PLT 226 03/08/2018    Lab Results  Component Value Date   CREATININE 1.43 (H) 03/08/2018   BUN 26 (H) 03/08/2018   NA 139 03/08/2018   K 3.9 03/08/2018   CL 96 (L) 03/08/2018   CO2 34 (H) 03/08/2018    Lab Results  Component Value Date   ALT 18 12/14/2017   AST 14 12/14/2017   ALKPHOS 89 12/14/2017   BILITOT 0.7 12/14/2017     Microbiology: Recent Results (from the past 240 hour(s))  Surgical pcr screen     Status: None   Collection Time: 03/04/18 12:31 AM  Result Value Ref Range Status   MRSA, PCR NEGATIVE NEGATIVE Final   Staphylococcus aureus NEGATIVE NEGATIVE Final    Comment: (NOTE) The Xpert SA Assay (FDA approved for NASAL specimens in patients 83 years of age and older), is one component of a comprehensive surveillance program. It is not intended to diagnose infection nor to guide or monitor treatment. Performed at [facility], [place] [address] [place], [place] [address]     Alcide Evener, [facility] [phone] pager  03/08/2018, 10:49 AM

## 2018-03-09 DIAGNOSIS — L02415 Cutaneous abscess of right lower limb: Secondary | ICD-10-CM

## 2018-03-09 DIAGNOSIS — R8271 Bacteriuria: Secondary | ICD-10-CM

## 2018-03-09 LAB — GLUCOSE, CAPILLARY
GLUCOSE-CAPILLARY: 96 mg/dL (ref 70–99)
Glucose-Capillary: 95 mg/dL (ref 70–99)
Glucose-Capillary: 138 mg/dL — ABNORMAL HIGH (ref 70–99)

## 2018-03-09 LAB — C-REACTIVE PROTEIN: CRP: 6.3 mg/dL — ABNORMAL HIGH (ref <1.0)

## 2018-03-09 LAB — SEDIMENTATION RATE: Sed Rate: 56 mm/hr — ABNORMAL HIGH (ref 0–22)

## 2018-03-09 LAB — HIV ANTIBODY (ROUTINE TESTING W REFLEX): HIV Screen 4th Generation wRfx: NONREACTIVE

## 2018-03-09 MED ORDER — OXYCODONE-ACETAMINOPHEN 5-325 MG PO TABS: 1.0000 | ORAL_TABLET | ORAL | 0 refills | Status: DC | PRN

## 2018-03-09 MED ORDER — DOCUSATE SODIUM 100 MG PO CAPS: 100.0000 mg | ORAL_CAPSULE | Freq: Two times a day (BID) | ORAL | 0 refills | Status: DC

## 2018-03-09 MED ORDER — POLYETHYLENE GLYCOL 3350 17 G PO PACK: 17.0000 g | PACK | Freq: Every day | ORAL | 0 refills | Status: DC | PRN

## 2018-03-09 NOTE — Plan of Care (Signed)
  Problem: Clinical Measurements: Goal: Ability to maintain clinical measurements within normal limits will improve Outcome: Progressing Goal: Will remain free from infection Outcome: Progressing Goal: Diagnostic test results will improve Outcome: Progressing Goal: Respiratory complications will improve Outcome: Progressing Goal: Cardiovascular complication will be avoided Outcome: Progressing   Problem: Activity: Goal: Risk for activity intolerance will decrease Outcome: Progressing   Problem: Nutrition: Goal: Adequate nutrition will be maintained Outcome: Progressing   Problem: Coping: Goal: Level of anxiety will decrease Outcome: Progressing   Problem: Elimination: Goal: Will not experience complications related to bowel motility Outcome: Progressing Goal: Will not experience complications related to urinary retention Outcome: Progressing   Problem: Pain Managment: Goal: General experience of comfort will improve Outcome: Progressing   Problem: Safety: Goal: Ability to remain free from injury will improve Outcome: Progressing   Problem: Skin Integrity: Goal: Risk for impaired skin integrity will decrease Outcome: Progressing   Problem: Clinical Measurements: Goal: Ability to avoid or minimize complications of infection will improve Outcome: Progressing   Problem: Skin Integrity: Goal: Skin integrity will improve Outcome: Progressing

## 2018-03-09 NOTE — Progress Notes (Addendum)
Subjective: No new complaints   Antibiotics:  Anti-infectives (From admission, onward)   Start     Dose/Rate Route Frequency Ordered Stop   03/08/18 1900  cefTRIAXone (ROCEPHIN) 1 g in sodium chloride 0.9 % 100 mL IVPB  Status:  Discontinued     1 g 200 mL/hr over 30 Minutes Intravenous Every 24 hours 03/08/18 1740 03/09/18 0856   03/04/18 0730  clindamycin (CLEOCIN) IVPB 900 mg     900 mg 100 mL/hr over 30 Minutes Intravenous To ShortStay Surgical 03/04/18 0721 03/04/18 1230   03/02/18 1400  clindamycin (CLEOCIN) IVPB 600 mg     600 mg 100 mL/hr over 30 Minutes Intravenous Every 8 hours 03/02/18 1323 03/07/18 1359   03/02/18 0700  clindamycin (CLEOCIN) IVPB 900 mg     900 mg 100 mL/hr over 30 Minutes Intravenous On call to O.R. 03/02/18 [id number] 03/02/18 0849      Medications: Scheduled Meds: . aspirin EC  81 mg Oral QHS  . carvedilol  25 mg Oral BID WC  . docusate sodium  100 mg Oral BID  . famotidine  20 mg Oral BID  . furosemide  120 mg Oral BID  . insulin aspart  0-20 Units Subcutaneous TID WC  . mometasone-formoterol  2 puff Inhalation BID  . OXcarbazepine  150 mg Oral BID  . potassium chloride  60 mEq Oral Daily  . simvastatin  40 mg Oral QHS  . spironolactone  25 mg Oral Daily   Continuous Infusions: . sodium chloride Stopped (03/04/18 1030)  . methocarbamol (ROBAXIN) IV     PRN Meds:.acetaminophen, acetaminophen-codeine, albuterol, bisacodyl, HYDROcodone-acetaminophen, hydrOXYzine, menthol-cetylpyridinium, methocarbamol **OR** methocarbamol (ROBAXIN) IV, metoCLOPramide **OR** metoCLOPramide (REGLAN) injection, ondansetron **OR** ondansetron (ZOFRAN) IV, oxyCODONE, oxyCODONE, polyethylene glycol    Objective: Weight change:   Intake/Output Summary (Last 24 hours) at 03/09/2018 0939 Last data filed at 03/08/2018 2028 Gross per 24 hour  Intake 720 ml  Output 2720 ml  Net -2000 ml   Blood pressure 131/66, pulse 71, temperature (!) 97.3 F (36.3 C),  temperature source Oral, resp. rate 20, height 5\' 4"  (1.626 m), weight (!) 171 kg, SpO2 95 %. Temp:  [97.3 F (36.3 C)-98.3 F (36.8 C)] 97.3 F (36.3 C) (02/05 0248) Pulse Rate:  [69-104] 71 (02/05 0248) Resp:  [16-20] 20 (02/05 0248) BP: (96-131)/(58-66) 131/66 (02/05 0248) SpO2:  [93 %-98 %] 95 % (02/05 0820) FiO2 (%):  [95 %] 95 % (02/04 2110)  Physical Exam: General: Alert and awake, oriented x3, not in any acute distress morbidly obese. HEENT: anicteric sclera, EOMI CVS regular rate, normal  Chest: , no wheezing, no respiratory distress Abdomen: soft non-distended,  Extremities: no edema or deformity noted bilaterally Skin: no rashes Neuro: nonfocal  CBC:    BMET Recent Labs    03/08/18 0924  NA 139  K 3.9  CL 96*  CO2 34*  GLUCOSE 155*  BUN 26*  CREATININE 1.43*  CALCIUM 8.4*     Liver Panel  Recent Labs    03/08/18 0924  ALBUMIN 2.2*       Sedimentation Rate Recent Labs    03/09/18 0455  ESRSEDRATE 56*   C-Reactive Protein Recent Labs    03/09/18 0455  CRP 6.3*    Micro Results: Recent Results (from the past 720 hour(s))  Surgical pcr screen     Status: None   Collection Time: 03/04/18 12:31 AM  Result Value Ref Range Status   MRSA, PCR NEGATIVE  NEGATIVE Final   Staphylococcus aureus NEGATIVE NEGATIVE Final    Comment: (NOTE) The Xpert SA Assay (FDA approved for NASAL specimens in patients 109 years of age and older), is one component of a comprehensive surveillance program. It is not intended to diagnose infection nor to guide or monitor treatment. Performed at [facility], [place] [address] [place], [place] [address]   Culture, Urine     Status: Abnormal (Preliminary result)   Collection Time: 03/08/18 10:19 AM  Result Value Ref Range Status   Specimen Description URINE, CATHETERIZED  Final   Special Requests   Final    NONE Performed at [facility], [place] [address], [place], [place] [address]    Culture  >=100,000 COLONIES/mL GRAM NEGATIVE RODS (A)  Final   Report Status PENDING  Incomplete  Expectorated sputum assessment w rflx to resp cult     Status: None   Collection Time: 03/08/18 10:19 AM  Result Value Ref Range Status   Specimen Description SPUTUM  Final   Special Requests NONE  Final   Sputum evaluation   Final    THIS SPECIMEN IS ACCEPTABLE FOR SPUTUM CULTURE Performed at [facility], [place] [address] [place], [place] [address]    Report Status 03/08/2018 FINAL  Final  Culture, respiratory     Status: None (Preliminary result)   Collection Time: 03/08/18 10:19 AM  Result Value Ref Range Status   Specimen Description SPUTUM  Final   Special Requests NONE Reflexed from [id number]  Final   Gram Stain   Final    ABUNDANT WBC PRESENT,BOTH PMN AND MONONUCLEAR ABUNDANT GRAM POSITIVE COCCI IN PAIRS IN CLUSTERS FEW GRAM VARIABLE ROD Performed at [facility], [place] [address] [place], [place] [address]    Culture PENDING  Incomplete   Report Status PENDING  Incomplete    Studies/Results: Dg Chest Port 1 View  Result Date: 03/08/2018 CLINICAL DATA:  Fevers EXAM: PORTABLE CHEST 1 VIEW COMPARISON:  02/15/2010 FINDINGS: Cardiac shadow is mildly enlarged. Aortic calcifications are again seen. Lungs are well aerated bilaterally. Minimal bibasilar atelectasis is seen. No pneumothorax or effusion is noted. No bony abnormality is seen. IMPRESSION: Minimal bibasilar atelectasis. Electronically Signed   By: Inez Catalina M.D.   On: 03/08/2018 08:46      Assessment/Plan:  INTERVAL HISTORY: pt started on abx for "uti" but pt has NO pyuria and no symptoms   Active Problems:   Abscess of right lower leg   Traumatic wound dehiscence    April [name] is a 68 y.o. female with  Low grade fevers after skin graft for nonhealing leg ulcer  #1 Low grade fevers likely due to atelectasis  #2 Asymptomatic bacteruria WITHOUT symptoms or pyuria = NOT UTI  DC Ceftriaxone  We are  available as needed.  Please call with further questions.    LOS: 7 days   Alcide Evener 03/09/2018, 9:39 AM

## 2018-03-09 NOTE — Progress Notes (Addendum)
Patient will DC to: [facility] date: 03/09/2018 Family notified: [name] by: Corey Harold  Per MD patient ready for DC to [facility]. RN, patient, patient's family, and facility notified of DC. Discharge Summary sent to facility. RN given number for report [phone]. DC packet on chart. Ambulance transport requested for patient for 2:00 pm. CSW signing off.  [place], [place]

## 2018-03-09 NOTE — Progress Notes (Signed)
Subjective: 5 Days Post-Op Procedure(s) (LRB): REPEAT IRRIGATION AND DEBRIDEMENT RIGHT LEG, PLACE WOUND VAC (Right) POSSIBLE SPLIT THICKNESS SKIN GRAFT (Right) Patient reports pain as moderate.  Reports did not get up to chair yesterday.   Objective: Vital signs in last 24 hours: Temp:  [97.3 F (36.3 C)-98.4 F (36.9 C)] 97.3 F (36.3 C) (02/05 0248) Pulse Rate:  [69-104] 71 (02/05 0248) Resp:  [16-20] 20 (02/05 0248) BP: (96-131)/(52-66) 131/66 (02/05 0248) SpO2:  [93 %-98 %] 95 % (02/05 0820) FiO2 (%):  [95 %] 95 % (02/04 2110)  Intake/Output from previous day: 02/04 0701 - 02/05 0700 In: 1080 [P.O.:1080] Out: 1771 [Urine:3550; Drains:20] Intake/Output this shift: No intake/output data recorded.  Recent Labs    03/08/18 0821 03/08/18 0924  HGB 9.4* 9.5*   Recent Labs    03/08/18 0821 03/08/18 0924  WBC 6.0 6.6  RBC 3.25* 3.17*  HCT 31.4* 30.5*  PLT 224 226   Recent Labs    03/08/18 0924  NA 139  K 3.9  CL 96*  CO2 34*  BUN 26*  CREATININE 1.43*  GLUCOSE 155*  CALCIUM 8.4*   No results for input(s): LABPT, INR in the last 72 hours.  Right lower leg with VAC dressing in place and functioning well. VAC canister with about 70 more cc of serosanguinous drainage.  Can DC with Prevena Plus VAC to SNF.   Assessment/Plan: 5 Days Post-Op Procedure(s) (LRB): REPEAT IRRIGATION AND DEBRIDEMENT RIGHT LEG, PLACE WOUND VAC (Right) POSSIBLE SPLIT THICKNESS SKIN GRAFT (Right) Discharge to SNF Fever curve down. Likely atelectasis. Continue to mobilize OOB.  Okay for DC to SNF from ortho standpoint with follow up next week in the office.    Erlinda Hong, PA-C 03/09/2018, 8:30 AM  [place]

## 2018-03-09 NOTE — Discharge Instructions (Signed)
Plug Prevena VAC into wall outlet as much as possible to keep charged.   Keep right heel off bed as much as possible to prevent pressure ulcer  Follow up in 1 week in the office with Dr. [name]

## 2018-03-09 NOTE — Clinical Social Work Placement (Signed)
   CLINICAL SOCIAL WORK PLACEMENT  NOTE  Date:  03/09/2018  Patient Details  Name: April [name] MRN: [id number] Date of Birth: 09-16-1950  Clinical Social Work is seeking post-discharge placement for this patient at the [place] level of care (*CSW will initial, date and re-position this form in  chart as items are completed):      Patient/family provided with [facility] Work Department's list of facilities offering this level of care within the geographic area requested by the patient (or if unable, by the patient's family).  Yes   Patient/family informed of their freedom to choose among providers that offer the needed level of care, that participate in Medicare, Medicaid or managed care program needed by the patient, have an available bed and are willing to accept the patient.      Patient/family informed of [facility]'s ownership interest in [facility] and [facility], as well as of the fact that they are under no obligation to receive care at these facilities.  PASRR submitted to EDS on       PASRR number received on 03/05/18     Existing PASRR number confirmed on       FL2 transmitted to all facilities in geographic area requested by pt/family on 03/05/18     FL2 transmitted to all facilities within larger geographic area on       Patient informed that his/her managed care company has contracts with or will negotiate with certain facilities, including the following:        Yes   Patient/family informed of bed offers received.  Patient chooses bed at [facility]     Physician recommends and patient chooses bed at      Patient to be transferred to [facility] on 03/09/18.  Patient to be transferred to facility by [facility]     Patient family notified on 03/09/18 of transfer.  Name of family member notified:  Dwight     PHYSICIAN       Additional Comment:     _______________________________________________ Alberteen Sam, LCSW 03/09/2018, 9:52 AM

## 2018-03-09 NOTE — Discharge Summary (Signed)
Discharge Diagnoses:  Active Problems:   Abscess of right lower leg   Traumatic wound dehiscence   Surgeries: Procedure(s): REPEAT IRRIGATION AND DEBRIDEMENT RIGHT LEG, PLACE WOUND VAC POSSIBLE SPLIT THICKNESS SKIN GRAFT on 03/04/2018    Consultants: Treatment Team:  Minerva Ends, MD  Discharged Condition: Improved  Hospital Course: April [name] is an 68 y.o. female who was admitted 03/02/2018 with a chief complaint of abscess/chronic ulcer of right lower leg, with a final diagnosis of Cellulitis, Open Wound Right Leg.  Patient was brought to the operating room on 03/04/2018 and underwent Procedure(s): REPEAT IRRIGATION AND DEBRIDEMENT RIGHT LEG, PLACE WOUND VAC POSSIBLE SPLIT THICKNESS SKIN GRAFT.    Patient was given perioperative antibiotics:  Anti-infectives (From admission, onward)   Start     Dose/Rate Route Frequency Ordered Stop   03/08/18 1900  cefTRIAXone (ROCEPHIN) 1 g in sodium chloride 0.9 % 100 mL IVPB  Status:  Discontinued     1 g 200 mL/hr over 30 Minutes Intravenous Every 24 hours 03/08/18 1740 03/09/18 0856   03/04/18 0730  clindamycin (CLEOCIN) IVPB 900 mg     900 mg 100 mL/hr over 30 Minutes Intravenous To [facility] 03/04/18 0721 03/04/18 1230   03/02/18 1400  clindamycin (CLEOCIN) IVPB 600 mg     600 mg 100 mL/hr over 30 Minutes Intravenous Every 8 hours 03/02/18 1323 03/07/18 1359   03/02/18 0700  clindamycin (CLEOCIN) IVPB 900 mg     900 mg 100 mL/hr over 30 Minutes Intravenous On call to O.R. 03/02/18 0737 03/02/18 0849    .  Patient was given sequential compression devices, early ambulation, and aspirin for DVT prophylaxis.  Recent vital signs:  Patient Vitals for the past 24 hrs:  BP Temp Temp src Pulse Resp SpO2  03/09/18 0820 - - - - - 95 %  03/09/18 0248 131/66 (!) 97.3 F (36.3 C) Oral 71 20 93 %  03/08/18 2110 - - - 94 18 -  03/08/18 2025 (!) 96/58 - - (!) 104 18 94 %  03/08/18 1725 - - - - - 98 %  03/08/18 1500 106/65 98.3  F (36.8 C) Oral 69 16 95 %  .  Recent laboratory studies: Dg Chest Port 1 View  Result Date: 03/08/2018 CLINICAL DATA:  Fevers EXAM: PORTABLE CHEST 1 VIEW COMPARISON:  02/15/2010 FINDINGS: Cardiac shadow is mildly enlarged. Aortic calcifications are again seen. Lungs are well aerated bilaterally. Minimal bibasilar atelectasis is seen. No pneumothorax or effusion is noted. No bony abnormality is seen. IMPRESSION: Minimal bibasilar atelectasis. Electronically Signed   By: Inez Catalina M.D.   On: 03/08/2018 08:46    Discharge Medications:   Allergies as of 03/09/2018      Reactions   Penicillins Rash, Other (See Comments)   She was told not to take it anymore. Has patient had a PCN reaction causing immediate rash, facial/tongue/throat swelling, SOB or lightheadedness with hypotension: Yes Has patient had a PCN reaction causing severe rash involving mucus membranes or skin necrosis: No Has patient had a PCN reaction that required hospitalization: No Has patient had a PCN reaction occurring within the last 10 years: No If all of the above answers are "NO", then may proceed with Cephalosporin use.'   Sulfonamide Derivatives Anaphylaxis, Other (See Comments)   REACTION: internal "burning"   Benazepril Hcl Other (See Comments)   Patient unsure of reaction.   Dipyridamole Other (See Comments)   Unknown   Estrogens Other (See Comments)   Unknown  Hydrochlorothiazide Other (See Comments)   Unknown   Hydrochlorothiazide W-triamterene Other (See Comments)   Hypokalemia   Lotensin [benazepril Hcl] Other (See Comments)   Unknown   Metronidazole Other (See Comments)   Patient unsure of reaction.   Spironolactone Other (See Comments)   "kidney problems"   Sulfa Antibiotics Other (See Comments)   Unknown   Torsemide Other (See Comments)   Patient unsure of reaction.   Triamterene-hctz Other (See Comments)   Unknown   Valsartan Other (See Comments)   Patient unsure of reaction.       Medication List    STOP taking these medications   HYDROcodone-acetaminophen 5-325 MG tablet Commonly known as:  NORCO/VICODIN     TAKE these medications   albuterol 108 (90 Base) MCG/ACT inhaler Commonly known as:  PROVENTIL HFA Inhale 2 puffs into the lungs every 4 (four) hours as needed for wheezing or shortness of breath.   albuterol (2.5 MG/3ML) 0.083% nebulizer solution Commonly known as:  PROVENTIL Take 3 mLs (2.5 mg total) by nebulization every 6 (six) hours as needed for wheezing or shortness of breath.   budesonide-formoterol 160-4.5 MCG/ACT inhaler Commonly known as:  SYMBICORT one - two inhalations every 12 hours; gargle and spit after use What changed:    how much to take  how to take this  when to take this  reasons to take this  additional instructions   carvedilol 25 MG tablet Commonly known as:  COREG TAKE 1 TABLET BY MOUTH TWICE DAILY WITH A MEAL What changed:  See the new instructions.   Cod Liver Oil Caps Take 1 capsule by mouth daily.   cyanocobalamin 1000 MCG/ML injection Commonly known as:  (VITAMIN B-12) INJECT 1ML INTO MUSCLE EVERY 30 DAYS What changed:    how much to take  how to take this  when to take this  additional instructions   diclofenac sodium 1 % Gel Commonly known as:  VOLTAREN Apply 2 g topically 4 (four) times daily. What changed:    when to take this  reasons to take this   docusate sodium 100 MG capsule Commonly known as:  COLACE Take 1 capsule (100 mg total) by mouth 2 (two) times daily.   doxycycline 100 MG capsule Commonly known as:  VIBRAMYCIN Take 1 capsule (100 mg total) by mouth 2 (two) times daily. One po bid x 7 days What changed:  additional instructions   ECOTRIN LOW STRENGTH 81 MG EC tablet Generic drug:  aspirin Take 81 mg by mouth at bedtime.   famotidine 20 MG tablet Commonly known as:  PEPCID Take 1 tablet (20 mg total) by mouth 2 (two) times daily.   Fish Oil 1000 MG Caps Take  1,000 mg by mouth daily.   Flax Seed Oil 1000 MG Caps Take 1,000 mg by mouth 2 (two) times daily.   fluocinonide cream 0.05 % Commonly known as:  LIDEX Apply 1 application topically 3 (three) times daily as needed (for itching).   folic acid 353 MCG tablet Commonly known as:  FOLVITE Take 800 mcg by mouth at bedtime.   furosemide 40 MG tablet Commonly known as:  LASIX Take 3 tablets (120 mg total) by mouth 2 (two) times daily.   hydrOXYzine 10 MG tablet Commonly known as:  ATARAX/VISTARIL Take 1 tablet (10 mg total) by mouth 3 (three) times daily as needed. What changed:  reasons to take this   multivitamin tablet Take 1 tablet by mouth daily.   NEEDLE (DISP) 23  G 23G X 1-1/2" Misc Commonly known as:  EASY TOUCH FLIPLOCK NEEDLES Use to inject B12 once monthly.   nystatin cream Commonly known as:  MYCOSTATIN Apply 1 application topically 2 (two) times daily.   onetouch ultrasoft lancets Use to help check blood sugars twice a day Dx E11.9   OXcarbazepine 150 MG tablet Commonly known as:  TRILEPTAL Take 1 tablet (150 mg total) by mouth 2 (two) times daily.   oxyCODONE-acetaminophen 5-325 MG tablet Commonly known as:  PERCOCET Take 1 tablet by mouth every 6 (six) hours as needed for severe pain. What changed:  Another medication with the same name was added. Make sure you understand how and when to take each.   oxyCODONE-acetaminophen 5-325 MG tablet Commonly known as:  PERCOCET/ROXICET Take 1 tablet by mouth every 4 (four) hours as needed for moderate pain or severe pain. What changed:  You were already taking a medication with the same name, and this prescription was added. Make sure you understand how and when to take each.   polyethylene glycol packet Commonly known as:  MIRALAX / GLYCOLAX Take 17 g by mouth daily as needed for mild constipation.   potassium chloride SA 20 MEQ tablet Commonly known as:  K-DUR,KLOR-CON TAKE 3 TABLETS BY MOUTH DAILY.    simvastatin 40 MG tablet Commonly known as:  ZOCOR TAKE 1 TABLET BY MOUTH AT BEDTIME   spironolactone 25 MG tablet Commonly known as:  ALDACTONE TAKE 1 TABLET BY MOUTH ONCE DAILY   SYRINGE-NEEDLE (DISP) 3 ML 23G X 1" 3 ML Misc Use with B12 to inject IM every 30 days   triamcinolone cream 0.1 % Commonly known as:  KENALOG Apply 1 application topically daily.       Diagnostic Studies: Dg Chest 2 View  Result Date: 02/15/2018 CLINICAL DATA:  68 year old who fell in her kitchen this morning and injured the RIGHT LOWER leg and complains of chest pain. Initial encounter. EXAM: CHEST - 2 VIEW COMPARISON:  03/23/2017 and earlier. FINDINGS: AP ERECT and LATERAL images were obtained. Cardiac silhouette upper normal in size to slightly enlarged, unchanged. Thoracic aorta mildly atherosclerotic, unchanged. Hilar and mediastinal contours otherwise unremarkable. Prominent bronchovascular markings diffusely and mild to moderate central peribronchial thickening, unchanged. Lungs otherwise clear. No localized airspace consolidation. No pleural effusions. No pneumothorax. Normal pulmonary vascularity. Degenerative changes and DISH involving the thoracic spine. IMPRESSION: No acute cardiopulmonary disease. Stable mild to moderate changes of chronic bronchitis and/or asthma. Electronically Signed   By: Evangeline Dakin M.D.   On: 02/15/2018 09:28   Dg Tibia/fibula Right  Result Date: 02/15/2018 CLINICAL DATA:  68 year old who fell in her kitchen this morning and injured the RIGHT LOWER leg with a laceration. Initial encounter. EXAM: RIGHT TIBIA AND FIBULA - 2 VIEW COMPARISON:  None. FINDINGS: Soft tissue of laceration anteriorly overlying the distal tibia. No evidence of acute fracture involving the tibia or fibula. Phleboliths in the subcutaneous tissues of the anterior lower leg. Subcutaneous edema diffusely throughout the LOWER leg. Visualized ankle joint intact. Severe osteoarthritis involving the knee  joint. IMPRESSION: 1. No acute osseous abnormality. 2. Severe osteoarthritis involving the RIGHT knee joint. Electronically Signed   By: Evangeline Dakin M.D.   On: 02/15/2018 09:26   Dg Chest Port 1 View  Result Date: 03/08/2018 CLINICAL DATA:  Fevers EXAM: PORTABLE CHEST 1 VIEW COMPARISON:  02/15/2010 FINDINGS: Cardiac shadow is mildly enlarged. Aortic calcifications are again seen. Lungs are well aerated bilaterally. Minimal bibasilar atelectasis is seen. No pneumothorax  or effusion is noted. No bony abnormality is seen. IMPRESSION: Minimal bibasilar atelectasis. Electronically Signed   By: Inez Catalina M.D.   On: 03/08/2018 08:46    Patient benefited maximally from their hospital stay and there were no complications.     Disposition: Discharge disposition: 03-Skilled Nursing Facility      Discharge Instructions    Call MD / Call 911   Complete by:  As directed    If you experience chest pain or shortness of breath, CALL 911 and be transported to the hospital emergency room.  If you develope a fever above 101 F, pus (white drainage) or increased drainage or redness at the wound, or calf pain, call your surgeon's office.   Care order/instruction:   Complete by:  As directed    Connect VAC to Prevena Plus VAC machine prior to DC to SNF   Constipation Prevention   Complete by:  As directed    Drink plenty of fluids.  Prune juice may be helpful.  You may use a stool softener, such as Colace (over the counter) 100 mg twice a day.  Use MiraLax (over the counter) for constipation as needed.   Diet - low sodium heart healthy   Complete by:  As directed    Discharge instructions   Complete by:  As directed    Keep the Prevena VAC plugged into wall outlet to keep the machine charged.   Keep right heel off the bed as much as possible to prevent pressure ulcer  Push incentive spirometry every hour while awake   Increase activity slowly as tolerated   Complete by:  As directed       Follow-up Information    [name], MD. Schedule an appointment as soon as possible for a visit in 1 week(s).   Specialty:  Orthopedic Surgery Contact information: [place] [address] [phone]            Signed: Ulysees Barns 03/09/2018, 9:03 AM [employer] [phone]

## 2018-03-09 NOTE — Progress Notes (Signed)
Report given to Glori Luis, RN at facility. Pt is not in distress and understands discharge instructions. Switched to [place] wound vac before discharge.

## 2018-03-09 NOTE — Progress Notes (Signed)
  Flutter at bedside.  Patient displays good effort.  Productive cough.

## 2018-03-10 ENCOUNTER — Telehealth: Payer: Self-pay | Admitting: *Deleted

## 2018-03-10 LAB — CULTURE, RESPIRATORY W GRAM STAIN: Culture: NORMAL

## 2018-03-10 NOTE — Telephone Encounter (Signed)
Pt was on TCM report was admitted 03/02/2018 with a chief complaint of abscess/chronic ulcer of right lower leg, with a final diagnosis of Cellulitis, Open Wound Right Leg.  Patient had procedure on 03/04/2018 and underwent REPEAT IRRIGATION AND DEBRIDEMENT RIGHT LEG, PLACE WOUND VAC, POSSIBLE SPLIT THICKNESS SKIN GRAFT. Pt D/C 03/09/18, and will follow-up with surgeon.Marland KitchenJohny Chess

## 2018-03-11 ENCOUNTER — Telehealth (INDEPENDENT_AMBULATORY_CARE_PROVIDER_SITE_OTHER): Payer: Self-pay | Admitting: Orthopedic Surgery

## 2018-03-11 ENCOUNTER — Encounter (HOSPITAL_COMMUNITY): Payer: Self-pay | Admitting: Orthopedic Surgery

## 2018-03-11 LAB — URINE CULTURE: Culture: >=100000 — AB

## 2018-03-11 NOTE — Telephone Encounter (Signed)
April [name] with [facility] called in said he needs a call back in regards to getting a wound vac for this pt, wants to speak to Dr.Duda or Shawn.  [phone]

## 2018-03-13 LAB — CULTURE, BLOOD (ROUTINE X 2)
Culture: NO GROWTH
Culture: NO GROWTH
Special Requests: ADEQUATE
Special Requests: ADEQUATE

## 2018-03-14 NOTE — Telephone Encounter (Signed)
I called facility and phone is ringing without answer. This pt is s/p I&D and has preveena wound vac. Should have remained in place until this Friday. Pt has an appt tomorrow. Will continue to reach facility was unsuccessful trying to reach nurse phone rings without answering

## 2018-03-15 ENCOUNTER — Ambulatory Visit (INDEPENDENT_AMBULATORY_CARE_PROVIDER_SITE_OTHER): Payer: Medicare Other | Admitting: Physician Assistant

## 2018-03-15 ENCOUNTER — Encounter (INDEPENDENT_AMBULATORY_CARE_PROVIDER_SITE_OTHER): Payer: Self-pay | Admitting: Physician Assistant

## 2018-03-15 VITALS — Ht 64.0 in | Wt 377.0 lb

## 2018-03-15 DIAGNOSIS — I87331 Chronic venous hypertension (idiopathic) with ulcer and inflammation of right lower extremity: Secondary | ICD-10-CM

## 2018-03-15 DIAGNOSIS — T8133XS Disruption of traumatic injury wound repair, sequela: Secondary | ICD-10-CM

## 2018-03-15 DIAGNOSIS — Z945 Skin transplant status: Secondary | ICD-10-CM

## 2018-03-15 DIAGNOSIS — E43 Unspecified severe protein-calorie malnutrition: Secondary | ICD-10-CM

## 2018-03-15 DIAGNOSIS — Z6841 Body Mass Index (BMI) 40.0 and over, adult: Secondary | ICD-10-CM

## 2018-03-15 DIAGNOSIS — L97919 Non-pressure chronic ulcer of unspecified part of right lower leg with unspecified severity: Secondary | ICD-10-CM

## 2018-03-15 NOTE — Telephone Encounter (Signed)
This pt was seen in the office this morning and vac was removed. Orders written for nursing facility for dressing change

## 2018-03-15 NOTE — Progress Notes (Signed)
Office Visit Note   Patient: April [name]           Date of Birth: [birth year]           MRN: [id number] Visit Date: 03/15/2018              Requested by: [name], MD [place], [place] [address] PCP: [name], MD  Chief Complaint  Patient presents with  . Right Leg - Routine Post Op    03/04/2018 repeat I&D RLE      HPI: The patient is a 68 year old woman who is seen for postoperative follow-up following irrigation debridement of the right lower leg ulcer with placement of a split split thickness skin graft, allograft on 03/04/2018.  She has had a Praveena VAC on the area.  She was discharged to skilled nursing.  She has edema of the left lower extremity as well and is in a Dynaflex compression wrap.  She has severe protein calorie malnutrition and we discussed protein supplements 3 times daily at this visit.  Assessment & Plan: Visit Diagnoses:  1. S/P split thickness skin graft   2. Idiopathic chronic venous hypertension of right lower extremity with ulcer and inflammation (HCC)   3. Traumatic wound dehiscence, sequela   4. Body mass index 60.0-69.9, adult ([place])   5. Severe protein-calorie malnutrition ([place])     Plan: The VAC dressing including the cleanse ankle the ankle foot is the fifth metatarsal nail just adjusted sponge was removed this visit.  Endoform was applied to the wound bed and covered with Adaptic.  Orders were sent for daily dressing changes to the right lower leg with ABD pad over the Adaptic mesh layer and strict instructions not to remove the Adaptic layer, then apply Ace wraps for edema control.  The Dynaflex to the left lower extremity was reapplied.  Recommend protein supplements 3 times daily.  Elevate bilateral lower extremities as much as possible higher than the level of the heart.  Follow-up in 1 week.  Follow-Up Instructions: Return in about 1 week (around 03/22/2018).   Ortho Exam  Patient is alert, oriented, no adenopathy,  well-dressed, normal affect, normal respiratory effort. The right lower leg wound is 7 x 3 x 1 cm with good bleeding tissue.  There are no signs of infection or cellulitis.  There is no periwound irritation.  Endoform was applied to the wound bed and then covered with Adaptic and she will need daily dressing changes.  The left lower extremity compression wrap was removed and there are healing blisters but no signs of active ulceration or infection.  The compression wrap did not go high enough and a higher compression wrap was placed today.  She has palpable pedal pulses bilaterally.      Imaging: No results found. No images are attached to the encounter.  Labs: Lab Results  Component Value Date   HGBA1C 6.5 12/03/2017   HGBA1C 6.2 06/29/2017   HGBA1C 5.7 12/11/2016   ESRSEDRATE 56 (H) 03/09/2018   ESRSEDRATE 13 06/02/2016   ESRSEDRATE 24 (H) 01/23/2011   CRP 6.3 (H) 03/09/2018   CRP 2.2 06/02/2016   REPTSTATUS 03/11/2018 FINAL 03/08/2018   REPTSTATUS 03/08/2018 FINAL 03/08/2018   REPTSTATUS 03/10/2018 FINAL 03/08/2018   GRAMSTAIN  03/08/2018    ABUNDANT WBC PRESENT,BOTH PMN AND MONONUCLEAR ABUNDANT GRAM POSITIVE COCCI IN PAIRS IN CLUSTERS FEW GRAM VARIABLE ROD    CULT (A) 03/08/2018    >=100,000 COLONIES/mL ESCHERICHIA COLI >=100,000  COLONIES/mL PSEUDOMONAS AERUGINOSA    CULT  03/08/2018    MODERATE Consistent with normal respiratory flora. Performed at [facility], [place] [address], [place], [place] [address]    LABORGA ESCHERICHIA COLI (A) 03/08/2018   LABORGA PSEUDOMONAS AERUGINOSA (A) 03/08/2018     Lab Results  Component Value Date   ALBUMIN 2.2 (L) 03/08/2018   ALBUMIN 4.0 12/14/2017   ALBUMIN 3.8 12/03/2017    Body mass index is 64.71 kg/m.  Orders:  No orders of the defined types were placed in this encounter.  No orders of the defined types were placed in this encounter.    Procedures: No procedures performed  Clinical Data: No additional  findings.  ROS:  All other systems negative, except as noted in the HPI. Review of Systems  Objective: Vital Signs: Ht 5\' 4"  (1.626 m)   Wt (!) 377 lb (171 kg)   BMI 64.71 kg/m   Specialty Comments:  No specialty comments available.  PMFS History: Patient Active Problem List   Diagnosis Date Noted  . Traumatic wound dehiscence   . Wound of right leg 02/18/2018  . Blister 02/18/2018  . Cellulitis of right leg 01/14/2018  . Skin lumps 01/14/2018  . Bilateral leg cramps 12/14/2017  . Dry skin dermatitis 06/29/2017  . Left shoulder pain 08/14/2016  . Meralgia paresthetica 07/16/2016  . Chronic left-sided low back pain with left-sided sciatica 06/02/2016  . Venous stasis ulcer ([place]) 05/13/2016  . Abscess of right lower leg 03/28/2016  . Whole body pain 03/20/2016  . Diabetic peripheral neuropathy ([place]) 03/20/2016  . Osteopenia 01/11/2016  . CKD (chronic kidney disease) stage 3, GFR 30-59 ml/min (HCC) 01/02/2016  . Hand paresthesia 07/18/2015  . Carpal tunnel syndrome 06/07/2015  . Family history of colon cancer   . Diabetes mellitus with neurological manifestations ([place]) 04/18/2015  . Bilateral leg edema 04/11/2015  . Cellulitis of leg, right 04/11/2015  . Abnormality of gait 01/03/2015  . Hereditary and idiopathic peripheral neuropathy 01/03/2015  . GERD (gastroesophageal reflux disease) 06/17/2014  . Morbid obesity with BMI of 50.0-59.9, adult ([place]) 09/05/2013  . Hx of colonic polyps 12/15/2012  . Vitamin B12 deficiency 11/03/2012  . Intrinsic asthma 03/23/2012  . Chronic diastolic heart failure ([place]) 02/20/2011  . OSA (obstructive sleep apnea) 09/17/2010  . URINARY URGENCY 01/08/2010  . CAD, NATIVE VESSEL 11/20/2008  . OSTEOARTHRITIS, KNEES, BILATERAL, SEVERE 06/14/2008  . Hyperlipidemia 05/10/2007  . Essential hypertension 01/18/2007  . HYPOKALEMIA 04/30/2006  . Body mass index 60.0-69.9, adult ([place]) 04/30/2006  . HX, PERSONAL, PEPTIC ULCER DISEASE 04/30/2006     Past Medical History:  Diagnosis Date  . Anemia   . Asthma   . CAD (coronary artery disease)   . Carpal tunnel syndrome   . Cellulitis of both lower extremities 04/11/2015  . CHF (congestive heart failure) ([place])   . Colon polyp, hyperplastic 2007 & 2012  . Complication of anesthesia 1999   svt with renal calculi surgery, no problems since  . CTS (carpal tunnel syndrome)   . Diabetes mellitus   . Eczema   . GERD (gastroesophageal reflux disease)   . Hyperlipidemia   . Hypertension   . Leg ulcer ([place]) 04/24/2015   Right lateral leg No evidence of an infection Monitor closely Keep edema controlled   . Meralgia paresthetica    Dr. [name]  . Morbid obesity ([place])   . Morbid obesity ([place])   . Neuropathy    toes and legs  . Osteoarthrosis, unspecified  whether generalized or localized, lower leg   . PUD (peptic ulcer disease)   . Shortness of breath dyspnea    with exertion  . Sleep apnea    per progress note 02/25/2018  . Type II or unspecified type diabetes mellitus without mention of complication, not stated as uncontrolled   . Unspecified hereditary and idiopathic peripheral neuropathy   . Vitamin B12 deficiency   . Wears glasses   . Wound cellulitis    right upper leg, healing well    Family History  Problem Relation Age of Onset  . Colon cancer Mother   . Prostate cancer Father   . Colon cancer Father   . Diabetes Maternal Aunt   . Breast cancer Maternal Aunt   . Diabetes Maternal Uncle   . Diabetes Paternal Aunt   . Stroke Paternal Aunt        > 65  . Heart disease Paternal Aunt   . Diabetes Paternal Uncle   . Breast cancer Maternal Aunt         X 2  . Breast cancer Cousin     Past Surgical History:  Procedure Laterality Date  . ABDOMINAL HYSTERECTOMY    . CARDIAC CATHETERIZATION  2002   non obstructive disease  . colonoscopy with polypectomy  2007 & 2012    hyperplastic ;Dr Watt Climes  . COLONOSCOPY WITH PROPOFOL N/A 06/04/2015   Procedure: COLONOSCOPY WITH  PROPOFOL;  Surgeon: Jerene Bears, MD;  Location: [facility];  Service: Gastroenterology;  Laterality: N/A;  . DEBRIDEMENT LEG Right 03/02/2018   WOUND VAC APPLIED  . DILATION AND CURETTAGE OF UTERUS     multiple  . HEMORRHOID SURGERY    . I&D EXTREMITY Right 03/02/2018   Procedure: RIGHT LEG DEBRIDEMENT AND PLACE VAC;  Surgeon: [name], MD;  Location: [facility];  Service: Orthopedics;  Laterality: Right;  . I&D EXTREMITY Right 03/04/2018   Procedure: REPEAT IRRIGATION AND DEBRIDEMENT RIGHT LEG, PLACE WOUND VAC;  Surgeon: [name], MD;  Location: [facility];  Service: Orthopedics;  Laterality: Right;  . renal calculi  12/1997   SVT with induction of anesthesia  . right knee arthroscopy    . SKIN SPLIT GRAFT Right 03/04/2018   Procedure: POSSIBLE SPLIT THICKNESS SKIN GRAFT;  Surgeon: [name], MD;  Location: [facility];  Service: Orthopedics;  Laterality: Right;  . TONSILLECTOMY AND ADENOIDECTOMY     Social History   Occupational History  . Occupation: Disabled    Employer: RETIRED  Tobacco Use  . Smoking status: Never Smoker  . Smokeless tobacco: Never Used  Substance and Sexual Activity  . Alcohol use: No    Alcohol/week: 0.0 standard drinks  . Drug use: No  . Sexual activity: Not on file

## 2018-03-22 ENCOUNTER — Encounter (INDEPENDENT_AMBULATORY_CARE_PROVIDER_SITE_OTHER): Payer: Self-pay | Admitting: Physician Assistant

## 2018-03-22 ENCOUNTER — Ambulatory Visit (INDEPENDENT_AMBULATORY_CARE_PROVIDER_SITE_OTHER): Payer: Medicare Other | Admitting: Physician Assistant

## 2018-03-22 DIAGNOSIS — Z945 Skin transplant status: Secondary | ICD-10-CM

## 2018-03-22 DIAGNOSIS — T8133XS Disruption of traumatic injury wound repair, sequela: Secondary | ICD-10-CM

## 2018-03-22 DIAGNOSIS — Z6841 Body Mass Index (BMI) 40.0 and over, adult: Secondary | ICD-10-CM

## 2018-03-22 DIAGNOSIS — L97919 Non-pressure chronic ulcer of unspecified part of right lower leg with unspecified severity: Secondary | ICD-10-CM

## 2018-03-22 DIAGNOSIS — E43 Unspecified severe protein-calorie malnutrition: Secondary | ICD-10-CM

## 2018-03-22 DIAGNOSIS — I87331 Chronic venous hypertension (idiopathic) with ulcer and inflammation of right lower extremity: Secondary | ICD-10-CM

## 2018-03-25 ENCOUNTER — Encounter (INDEPENDENT_AMBULATORY_CARE_PROVIDER_SITE_OTHER): Payer: Self-pay | Admitting: Physician Assistant

## 2018-03-25 NOTE — Progress Notes (Signed)
Office Visit Note   Patient: April [name]           Date of Birth: 03-06-1950           MRN: [id number] Visit Date: 03/22/2018              Requested by: [name], MD [place], [place] [address] PCP: [name], MD  Chief Complaint  Patient presents with  . Right Leg - Follow-up, Wound Check, Routine Post Op  . Left Leg - Follow-up      HPI: Patient is a 68 year old woman who is seen for postoperative follow-up following irrigation and debridement of her right lower leg ulcer with placement of a split thickness skin graft, allograft on 03/04/2018.  She is residing at a skilled nursing facility.  She has had Adaptic over her graft site for the past week and we have been utilizing Ace wrapping for edema control.  She reports she is getting protein supplements several times daily nail.  She has had significant bilateral lower extremity edema and we have been utilizing a Dynaflex dressing to the left lower extremity a week at a time.  Assessment & Plan: Visit Diagnoses:  1. S/P split thickness skin graft   2. Idiopathic chronic venous hypertension of right lower extremity with ulcer and inflammation (HCC)   3. Body mass index 60.0-69.9, adult ([place])   4. Traumatic wound dehiscence, sequela   5. Severe protein-calorie malnutrition ([place])     Plan: Sutures were harvested this visit.  Will begin [place] honey dressings to the right lower extremity and apply Adaptic over the [place] and then cover with ABD pads.  She will need ABD pad changes daily and rewrapping with Ace wraps to control edema.  Continue to elevate as much as possible.  She may wash the right foot with soap and water daily and the dressing changes.  She can be weightbearing as tolerated and she may have activities to tolerance with physical therapy.  They are to leave the Dynaflex which was reapplied to the left lower leg today in place for the next week.  Orders were sent to her skilled nursing facility.   We may consider repeating skin grafts over the next couple of weeks depending upon her response to the [place] honey.  She will follow-up in 1 week.  Follow-Up Instructions: Return in about 1 week (around 03/29/2018).   Ortho Exam  Patient is alert, oriented, no adenopathy, well-dressed, normal affect, normal respiratory effort. The graft to the right lower extremity is incorporating well.  There is no signs of periwound irritation.  No signs of cellulitis or infection.  Her edema is moderately controlled with the Ace wrapping.  Her sutures were harvested this visit. The left lower extremity edema has been well controlled with the Dynaflex dressing and this was applied again today.  She has no breakdown over the left lower extremity. Imaging: No results found.   Labs: Lab Results  Component Value Date   HGBA1C 6.5 12/03/2017   HGBA1C 6.2 06/29/2017   HGBA1C 5.7 12/11/2016   ESRSEDRATE 56 (H) 03/09/2018   ESRSEDRATE 13 06/02/2016   ESRSEDRATE 24 (H) 01/23/2011   CRP 6.3 (H) 03/09/2018   CRP 2.2 06/02/2016   REPTSTATUS 03/11/2018 FINAL 03/08/2018   REPTSTATUS 03/08/2018 FINAL 03/08/2018   REPTSTATUS 03/10/2018 FINAL 03/08/2018   GRAMSTAIN  03/08/2018    ABUNDANT WBC PRESENT,BOTH PMN AND MONONUCLEAR ABUNDANT GRAM POSITIVE COCCI IN PAIRS IN CLUSTERS FEW  GRAM VARIABLE ROD    CULT (A) 03/08/2018    >=100,000 COLONIES/mL ESCHERICHIA COLI >=100,000 COLONIES/mL PSEUDOMONAS AERUGINOSA    CULT  03/08/2018    MODERATE Consistent with normal respiratory flora. Performed at [facility], [place] [address], [place], [place] [address]    LABORGA ESCHERICHIA COLI (A) 03/08/2018   LABORGA PSEUDOMONAS AERUGINOSA (A) 03/08/2018     Lab Results  Component Value Date   ALBUMIN 2.2 (L) 03/08/2018   ALBUMIN 4.0 12/14/2017   ALBUMIN 3.8 12/03/2017    There is no height or weight on file to calculate BMI.  Orders:  No orders of the defined types were placed in this encounter.  No  orders of the defined types were placed in this encounter.    Procedures: No procedures performed  Clinical Data: No additional findings.  ROS:  All other systems negative, except as noted in the HPI. Review of Systems  Objective: Vital Signs: There were no vitals taken for this visit.  Specialty Comments:  No specialty comments available.  PMFS History: Patient Active Problem List   Diagnosis Date Noted  . Traumatic wound dehiscence   . Wound of right leg 02/18/2018  . Blister 02/18/2018  . Cellulitis of right leg 01/14/2018  . Skin lumps 01/14/2018  . Bilateral leg cramps 12/14/2017  . Dry skin dermatitis 06/29/2017  . Left shoulder pain 08/14/2016  . Meralgia paresthetica 07/16/2016  . Chronic left-sided low back pain with left-sided sciatica 06/02/2016  . Venous stasis ulcer ([place]) 05/13/2016  . Abscess of right lower leg 03/28/2016  . Whole body pain 03/20/2016  . Diabetic peripheral neuropathy ([place]) 03/20/2016  . Osteopenia 01/11/2016  . CKD (chronic kidney disease) stage 3, GFR 30-59 ml/min (HCC) 01/02/2016  . Hand paresthesia 07/18/2015  . Carpal tunnel syndrome 06/07/2015  . Family history of colon cancer   . Diabetes mellitus with neurological manifestations ([place]) 04/18/2015  . Bilateral leg edema 04/11/2015  . Cellulitis of leg, right 04/11/2015  . Abnormality of gait 01/03/2015  . Hereditary and idiopathic peripheral neuropathy 01/03/2015  . GERD (gastroesophageal reflux disease) 06/17/2014  . Morbid obesity with BMI of 50.0-59.9, adult ([place]) 09/05/2013  . Hx of colonic polyps 12/15/2012  . Vitamin B12 deficiency 11/03/2012  . Intrinsic asthma 03/23/2012  . Chronic diastolic heart failure ([place]) 02/20/2011  . OSA (obstructive sleep apnea) 09/17/2010  . URINARY URGENCY 01/08/2010  . CAD, NATIVE VESSEL 11/20/2008  . OSTEOARTHRITIS, KNEES, BILATERAL, SEVERE 06/14/2008  . Hyperlipidemia 05/10/2007  . Essential hypertension 01/18/2007  . HYPOKALEMIA  04/30/2006  . Body mass index 60.0-69.9, adult ([place]) 04/30/2006  . HX, PERSONAL, PEPTIC ULCER DISEASE 04/30/2006   Past Medical History:  Diagnosis Date  . Anemia   . Asthma   . CAD (coronary artery disease)   . Carpal tunnel syndrome   . Cellulitis of both lower extremities 04/11/2015  . CHF (congestive heart failure) ([place])   . Colon polyp, hyperplastic 2007 & 2012  . Complication of anesthesia 1999   svt with renal calculi surgery, no problems since  . CTS (carpal tunnel syndrome)   . Diabetes mellitus   . Eczema   . GERD (gastroesophageal reflux disease)   . Hyperlipidemia   . Hypertension   . Leg ulcer ([place]) 04/24/2015   Right lateral leg No evidence of an infection Monitor closely Keep edema controlled   . Meralgia paresthetica    Dr. [name]  . Morbid obesity ([place])   . Morbid obesity ([place])   . Neuropathy  toes and legs  . Osteoarthrosis, unspecified whether generalized or localized, lower leg   . PUD (peptic ulcer disease)   . Shortness of breath dyspnea    with exertion  . Sleep apnea    per progress note 02/25/2018  . Type II or unspecified type diabetes mellitus without mention of complication, not stated as uncontrolled   . Unspecified hereditary and idiopathic peripheral neuropathy   . Vitamin B12 deficiency   . Wears glasses   . Wound cellulitis    right upper leg, healing well    Family History  Problem Relation Age of Onset  . Colon cancer Mother   . Prostate cancer Father   . Colon cancer Father   . Diabetes Maternal Aunt   . Breast cancer Maternal Aunt   . Diabetes Maternal Uncle   . Diabetes Paternal Aunt   . Stroke Paternal Aunt        > 65  . Heart disease Paternal Aunt   . Diabetes Paternal Uncle   . Breast cancer Maternal Aunt         X 2  . Breast cancer Cousin     Past Surgical History:  Procedure Laterality Date  . ABDOMINAL HYSTERECTOMY    . CARDIAC CATHETERIZATION  2002   non obstructive disease  . colonoscopy with polypectomy  2007  & 2012    hyperplastic ;Dr Watt Climes  . COLONOSCOPY WITH PROPOFOL N/A 06/04/2015   Procedure: COLONOSCOPY WITH PROPOFOL;  Surgeon: Jerene Bears, MD;  Location: [facility];  Service: Gastroenterology;  Laterality: N/A;  . DEBRIDEMENT LEG Right 03/02/2018   WOUND VAC APPLIED  . DILATION AND CURETTAGE OF UTERUS     multiple  . HEMORRHOID SURGERY    . I&D EXTREMITY Right 03/02/2018   Procedure: RIGHT LEG DEBRIDEMENT AND PLACE VAC;  Surgeon: [name], MD;  Location: [place];  Service: Orthopedics;  Laterality: Right;  . I&D EXTREMITY Right 03/04/2018   Procedure: REPEAT IRRIGATION AND DEBRIDEMENT RIGHT LEG, PLACE WOUND VAC;  Surgeon: [name], MD;  Location: [facility];  Service: Orthopedics;  Laterality: Right;  . renal calculi  12/1997   SVT with induction of anesthesia  . right knee arthroscopy    . SKIN SPLIT GRAFT Right 03/04/2018   Procedure: POSSIBLE SPLIT THICKNESS SKIN GRAFT;  Surgeon: [name], MD;  Location: [place];  Service: Orthopedics;  Laterality: Right;  . TONSILLECTOMY AND ADENOIDECTOMY     Social History   Occupational History  . Occupation: Disabled    Employer: RETIRED  Tobacco Use  . Smoking status: Never Smoker  . Smokeless tobacco: Never Used  Substance and Sexual Activity  . Alcohol use: No    Alcohol/week: 0.0 standard drinks  . Drug use: No  . Sexual activity: Not on file

## 2018-03-29 ENCOUNTER — Encounter (HOSPITAL_COMMUNITY): Payer: Self-pay | Admitting: *Deleted

## 2018-03-29 ENCOUNTER — Ambulatory Visit (INDEPENDENT_AMBULATORY_CARE_PROVIDER_SITE_OTHER): Payer: Medicare Other | Admitting: Physician Assistant

## 2018-03-29 ENCOUNTER — Encounter (INDEPENDENT_AMBULATORY_CARE_PROVIDER_SITE_OTHER): Payer: Self-pay | Admitting: Physician Assistant

## 2018-03-29 ENCOUNTER — Telehealth (INDEPENDENT_AMBULATORY_CARE_PROVIDER_SITE_OTHER): Payer: Self-pay

## 2018-03-29 ENCOUNTER — Other Ambulatory Visit: Payer: Self-pay

## 2018-03-29 ENCOUNTER — Ambulatory Visit (INDEPENDENT_AMBULATORY_CARE_PROVIDER_SITE_OTHER): Payer: Self-pay | Admitting: Physician Assistant

## 2018-03-29 VITALS — Ht 64.0 in | Wt 377.0 lb

## 2018-03-29 DIAGNOSIS — T8133XS Disruption of traumatic injury wound repair, sequela: Secondary | ICD-10-CM

## 2018-03-29 DIAGNOSIS — E43 Unspecified severe protein-calorie malnutrition: Secondary | ICD-10-CM

## 2018-03-29 DIAGNOSIS — I87331 Chronic venous hypertension (idiopathic) with ulcer and inflammation of right lower extremity: Secondary | ICD-10-CM

## 2018-03-29 DIAGNOSIS — Z6841 Body Mass Index (BMI) 40.0 and over, adult: Secondary | ICD-10-CM

## 2018-03-29 DIAGNOSIS — Z945 Skin transplant status: Secondary | ICD-10-CM

## 2018-03-29 DIAGNOSIS — L97919 Non-pressure chronic ulcer of unspecified part of right lower leg with unspecified severity: Secondary | ICD-10-CM

## 2018-03-29 MED ORDER — DOXYCYCLINE HYCLATE 100 MG PO CAPS: 100.0000 mg | ORAL_CAPSULE | Freq: Two times a day (BID) | ORAL | 1 refills | Status: DC

## 2018-03-29 NOTE — Progress Notes (Signed)
Office Visit Note   Patient: April [name]           Date of Birth: 1950-12-18           MRN: [id number] Visit Date: 03/29/2018              Requested by: [name], MD [place], [place] [address] PCP: [name], MD  Chief Complaint  Patient presents with  . Right Leg - Routine Post Op    03/04/2018 I&D RLE       HPI: The patient is a 68 year old woman who is seen for postoperative follow-up following irrigation and debridement of her right lower leg ulcer with placement of a split thickness skin graft, allograft on 03/04/2018.  She is at [facility].  Her graft has fully incorporated over the past week but she is had increased foul-smelling drainage.  We have been utilizing Dynaflex layered compression wraps for edema control.  Assessment & Plan: Visit Diagnoses:  1. S/P split thickness skin graft   2. Idiopathic chronic venous hypertension of right lower extremity with ulcer and inflammation (HCC)   3. Body mass index 60.0-69.9, adult ([place])   4. Traumatic wound dehiscence, sequela   5. Severe protein-calorie malnutrition ([place])     Plan: Plan return to the OR tomorrow for repeat irrigation and debridement and placement of VAC dressing, possible placement of split-thickness skin graft.  We applied silver alginate to the wounds today and Ace wrap for edema control for now.  She will follow-up in the office the week following her surgical procedure.  Follow-Up Instructions: Return in about 10 days (around 04/08/2018).   Ortho Exam  Patient is alert, oriented, no adenopathy, well-dressed, normal affect, normal respiratory effort. The right lower leg edema is controlled distally but she has massive edema proximally.  The meta honey was removed from the wound bed and had a large amount of green foul-smelling drainage over the area.  The wound bed has some yellow slough as well as very friable granulation.  She has palpable pedal  pulses.  Silver alginate was applied to the wound bed and the leg was Ace wrapped.  The left lower extremity was also Ace wrap for edema control for now.  Imaging: No results found.   Labs: Lab Results  Component Value Date   HGBA1C 6.5 12/03/2017   HGBA1C 6.2 06/29/2017   HGBA1C 5.7 12/11/2016   ESRSEDRATE 56 (H) 03/09/2018   ESRSEDRATE 13 06/02/2016   ESRSEDRATE 24 (H) 01/23/2011   CRP 6.3 (H) 03/09/2018   CRP 2.2 06/02/2016   REPTSTATUS 03/11/2018 FINAL 03/08/2018   REPTSTATUS 03/08/2018 FINAL 03/08/2018   REPTSTATUS 03/10/2018 FINAL 03/08/2018   GRAMSTAIN  03/08/2018    ABUNDANT WBC PRESENT,BOTH PMN AND MONONUCLEAR ABUNDANT GRAM POSITIVE COCCI IN PAIRS IN CLUSTERS FEW GRAM VARIABLE ROD    CULT (A) 03/08/2018    >=100,000 COLONIES/mL ESCHERICHIA COLI >=100,000 COLONIES/mL PSEUDOMONAS AERUGINOSA    CULT  03/08/2018    MODERATE Consistent with normal respiratory flora. Performed at [facility], [place] [address] [place], [place] [address]    LABORGA ESCHERICHIA COLI (A) 03/08/2018   LABORGA PSEUDOMONAS AERUGINOSA (A) 03/08/2018     Lab Results  Component Value Date   ALBUMIN 2.2 (L) 03/08/2018   ALBUMIN 4.0 12/14/2017   ALBUMIN 3.8 12/03/2017    Body mass index is 64.71 kg/m.  Orders:  No orders of the defined types were placed in  this encounter.  Meds ordered this encounter  Medications  . doxycycline (VIBRAMYCIN) 100 MG capsule    Sig: Take 1 capsule (100 mg total) by mouth 2 (two) times daily.    Dispense:  28 capsule    Refill:  1     Procedures: No procedures performed  Clinical Data: No additional findings.  ROS:  All other systems negative, except as noted in the HPI. Review of Systems  Objective: Vital Signs: Ht 5\' 4"  (1.626 m)   Wt (!) 377 lb (171 kg)   BMI 64.71 kg/m   Specialty Comments:  No specialty comments available.  PMFS History: Patient Active Problem List   Diagnosis Date Noted  . Traumatic wound dehiscence     . Wound of right leg 02/18/2018  . Blister 02/18/2018  . Cellulitis of right leg 01/14/2018  . Skin lumps 01/14/2018  . Bilateral leg cramps 12/14/2017  . Dry skin dermatitis 06/29/2017  . Left shoulder pain 08/14/2016  . Meralgia paresthetica 07/16/2016  . Chronic left-sided low back pain with left-sided sciatica 06/02/2016  . Venous stasis ulcer ([place]) 05/13/2016  . Abscess of right lower leg 03/28/2016  . Whole body pain 03/20/2016  . Diabetic peripheral neuropathy ([place]) 03/20/2016  . Osteopenia 01/11/2016  . CKD (chronic kidney disease) stage 3, GFR 30-59 ml/min (HCC) 01/02/2016  . Hand paresthesia 07/18/2015  . Carpal tunnel syndrome 06/07/2015  . Family history of colon cancer   . Diabetes mellitus with neurological manifestations ([place]) 04/18/2015  . Bilateral leg edema 04/11/2015  . Cellulitis of leg, right 04/11/2015  . Abnormality of gait 01/03/2015  . Hereditary and idiopathic peripheral neuropathy 01/03/2015  . GERD (gastroesophageal reflux disease) 06/17/2014  . Morbid obesity with BMI of 50.0-59.9, adult ([place]) 09/05/2013  . Hx of colonic polyps 12/15/2012  . Vitamin B12 deficiency 11/03/2012  . Intrinsic asthma 03/23/2012  . Chronic diastolic heart failure ([place]) 02/20/2011  . OSA (obstructive sleep apnea) 09/17/2010  . URINARY URGENCY 01/08/2010  . CAD, NATIVE VESSEL 11/20/2008  . OSTEOARTHRITIS, KNEES, BILATERAL, SEVERE 06/14/2008  . Hyperlipidemia 05/10/2007  . Essential hypertension 01/18/2007  . HYPOKALEMIA 04/30/2006  . Body mass index 60.0-69.9, adult ([place]) 04/30/2006  . HX, PERSONAL, PEPTIC ULCER DISEASE 04/30/2006   Past Medical History:  Diagnosis Date  . Anemia   . Asthma   . CAD (coronary artery disease)   . Carpal tunnel syndrome   . Cellulitis of both lower extremities 04/11/2015  . CHF (congestive heart failure) ([place])   . Colon polyp, hyperplastic 2007 & 2012  . Complication of anesthesia 1999   svt with renal calculi surgery, no problems  since  . CTS (carpal tunnel syndrome)   . Diabetes mellitus   . Eczema   . GERD (gastroesophageal reflux disease)   . Hyperlipidemia   . Hypertension   . Leg ulcer ([place]) 04/24/2015   Right lateral leg No evidence of an infection Monitor closely Keep edema controlled   . Meralgia paresthetica    Dr. [name]  . Morbid obesity ([place])   . Morbid obesity ([place])   . Neuropathy    toes and legs  . Osteoarthrosis, unspecified whether generalized or localized, lower leg   . PUD (peptic ulcer disease)   . Shortness of breath dyspnea    with exertion  . Sleep apnea    per progress note 02/25/2018  . Type II or unspecified type diabetes mellitus without mention of complication, not stated as uncontrolled   . Unspecified hereditary and idiopathic peripheral  neuropathy   . Vitamin B12 deficiency   . Wears glasses   . Wound cellulitis    right upper leg, healing well    Family History  Problem Relation Age of Onset  . Colon cancer Mother   . Prostate cancer Father   . Colon cancer Father   . Diabetes Maternal Aunt   . Breast cancer Maternal Aunt   . Diabetes Maternal Uncle   . Diabetes Paternal Aunt   . Stroke Paternal Aunt        > 65  . Heart disease Paternal Aunt   . Diabetes Paternal Uncle   . Breast cancer Maternal Aunt         X 2  . Breast cancer Cousin     Past Surgical History:  Procedure Laterality Date  . ABDOMINAL HYSTERECTOMY    . CARDIAC CATHETERIZATION  2002   non obstructive disease  . colonoscopy with polypectomy  2007 & 2012    hyperplastic ;Dr Watt Climes  . COLONOSCOPY WITH PROPOFOL N/A 06/04/2015   Procedure: COLONOSCOPY WITH PROPOFOL;  Surgeon: Jerene Bears, MD;  [facility];  Service: Gastroenterology;  Laterality: N/A;  . DEBRIDEMENT LEG Right 03/02/2018   WOUND VAC APPLIED  . DILATION AND CURETTAGE OF UTERUS     multiple  . HEMORRHOID SURGERY    . I&D EXTREMITY Right 03/02/2018   Procedure: RIGHT LEG DEBRIDEMENT AND PLACE VAC;  Surgeon: [name],  MD;  Location: [facility];  Service: Orthopedics;  Laterality: Right;  . I&D EXTREMITY Right 03/04/2018   Procedure: REPEAT IRRIGATION AND DEBRIDEMENT RIGHT LEG, PLACE WOUND VAC;  Surgeon: [name], MD;  Location: [facility];  Service: Orthopedics;  Laterality: Right;  . renal calculi  12/1997   SVT with induction of anesthesia  . right knee arthroscopy    . SKIN SPLIT GRAFT Right 03/04/2018   Procedure: POSSIBLE SPLIT THICKNESS SKIN GRAFT;  Surgeon: [name], MD;  Location: [facility];  Service: Orthopedics;  Laterality: Right;  . TONSILLECTOMY AND ADENOIDECTOMY     Social History   Occupational History  . Occupation: Disabled    Employer: RETIRED  Tobacco Use  . Smoking status: Never Smoker  . Smokeless tobacco: Never Used  Substance and Sexual Activity  . Alcohol use: No    Alcohol/week: 0.0 standard drinks  . Drug use: No  . Sexual activity: Not on file

## 2018-03-29 NOTE — Telephone Encounter (Signed)
Trying to get this pts surgery for tomorrow precerted with [employer]. Can you dictate today's office note when you get a chance so I can send to them please.

## 2018-03-29 NOTE — Pre-Procedure Instructions (Signed)
April [name]  03/29/2018      [id number] - [place], [place] - [address] AT [place] [address] Phone: [phone] Fax: [phone]  [facility] [phone] - [place], [address] AT [address] [place] [address] Phone: [phone] Fax: [phone]   Your procedure is scheduled on Wednesday, March 30, 2018  Report to [facility] Admitting at 1:15 P.M.  Call this number if you have problems the morning of surgery:  [phone]   Remember:  Do not eat or drink after midnight.   Take these medicines the morning of surgery with A SIP OF WATER: carvedilol (COREG),  famotidine (PEPCID),  OXcarbazepine (TRILEPTAL)  If needed: hydrOXYzine (ATARAX/VISTARIL) for itching,  Oxycodone for pain,   albuterol (PROVENTIL HFA) inhaler for wheezing or shortness of breath ( pt to bring inhaler in with her on day of surgery ),  albuterol (PROVENTIL) (2.5 MG/3ML) 0.083% nebulizer solution for wheezing or shortness of breath.  Stop taking vitamins, fish oil, Cod liver oil, Flaxseed and herbal medications. Do not take any NSAIDs ie: Ibuprofen, Advil, Naproxen (Aleve), Motrin, Voltaren Gel, BC and Goody Powder; stop now.    How to Manage Your Diabetes Before and After Surgery  Why is it important to control my blood sugar before and after surgery? . Improving blood sugar levels before and after surgery helps healing and can limit problems. . A way of improving blood sugar control is eating a healthy diet by: o  Eating less sugar and carbohydrates o  Increasing activity/exercise o  Talking with your doctor about reaching your blood sugar goals . High blood sugars (greater than 180 mg/dL) can raise your risk of infections and slow your recovery, so you will need to focus on controlling your diabetes during the weeks before  surgery. . Make sure that the doctor who takes care of your diabetes knows about your planned surgery including the date and location.  How do I manage my blood sugar before surgery? . Check your blood sugar at least 4 times a day, starting 2 days before surgery, to make sure that the level is not too high or low. o Check your blood sugar the morning of your surgery when you wake up and every 2 hours until you get to the Short Stay unit. . If your blood sugar is less than 70 mg/dL, you will need to treat for low blood sugar: o Treat a low blood sugar (less than 70 mg/dL) with  cup of clear juice (cranberry or apple), 4 glucose tablets, OR glucose gel. Recheck blood sugar in 15 minutes after treatment (to make sure it is greater than 70 mg/dL). If your blood sugar is not greater than 70 mg/dL on recheck, call [phone] o  for further instructions. . Report your blood sugar to the short stay nurse when you get to Short Stay.  . If you are admitted to the hospital after surgery: o Your blood sugar will be checked by the staff and you will probably be given insulin after surgery (instead of oral diabetes medicines) to make sure you have good blood sugar levels. o The goal for blood sugar control after surgery is 80-180 mg/dL.  Reviewed and Endorsed by [facility] Patient Education Committee, August 2015  Do not wear jewelry, make-up or nail polish.  Do not wear lotions, powders, or perfumes, or deodorant.  Do  not shave 48 hours prior to surgery.    Do not bring valuables to the hospital.  [facility] is not responsible for any belongings or valuables.  Contacts, dentures or bridgework may not be worn into surgery.  Leave your suitcase in the car.  After surgery it may be brought to your room.  Patients discharged the day of surgery will not be allowed to drive home.  Please read over the following fact sheets that you were given.

## 2018-03-29 NOTE — Progress Notes (Signed)
SDW-pre-op call completed by pt. Pt denies SOB and chest pain. Pt stated that she is under the care of Dr. Haroldine Laws, Cardiology. Pt nurse, Laretta Bolster, LPN, at [facility] confirmed receipt of faxed pre-op instructions. Nurse verbalized understanding of all pre-op instructions and was provided with [facility] fax number to fax current MAR.

## 2018-03-30 ENCOUNTER — Inpatient Hospital Stay (HOSPITAL_COMMUNITY)
Admission: RE | Admit: 2018-03-30 | Discharge: 2018-04-05 | DRG: 573 | Disposition: A | Discharge status: Skilled Nursing Facility | Payer: Medicare Other | Attending: Orthopedic Surgery | Admitting: Orthopedic Surgery

## 2018-03-30 ENCOUNTER — Inpatient Hospital Stay (HOSPITAL_COMMUNITY): Payer: Medicare Other | Admitting: Anesthesiology

## 2018-03-30 ENCOUNTER — Encounter (HOSPITAL_COMMUNITY)
Admission: RE | Disposition: A | Discharge status: Skilled Nursing Facility | Payer: Self-pay | Source: Home / Self Care | Attending: Orthopedic Surgery

## 2018-03-30 ENCOUNTER — Encounter (HOSPITAL_COMMUNITY): Payer: Self-pay | Admitting: Anesthesiology

## 2018-03-30 ENCOUNTER — Other Ambulatory Visit: Payer: Self-pay

## 2018-03-30 ENCOUNTER — Ambulatory Visit: Payer: Medicare Other

## 2018-03-30 DIAGNOSIS — G5710 Meralgia paresthetica, unspecified lower limb: Secondary | ICD-10-CM | POA: Diagnosis present

## 2018-03-30 DIAGNOSIS — Z86010 Personal history of colonic polyps: Secondary | ICD-10-CM

## 2018-03-30 DIAGNOSIS — I2510 Atherosclerotic heart disease of native coronary artery without angina pectoris: Secondary | ICD-10-CM | POA: Diagnosis present

## 2018-03-30 DIAGNOSIS — G609 Hereditary and idiopathic neuropathy, unspecified: Secondary | ICD-10-CM | POA: Diagnosis present

## 2018-03-30 DIAGNOSIS — G5600 Carpal tunnel syndrome, unspecified upper limb: Secondary | ICD-10-CM | POA: Diagnosis present

## 2018-03-30 DIAGNOSIS — J45909 Unspecified asthma, uncomplicated: Secondary | ICD-10-CM | POA: Diagnosis present

## 2018-03-30 DIAGNOSIS — L97919 Non-pressure chronic ulcer of unspecified part of right lower leg with unspecified severity: Secondary | ICD-10-CM | POA: Diagnosis present

## 2018-03-30 DIAGNOSIS — Z90710 Acquired absence of both cervix and uterus: Secondary | ICD-10-CM | POA: Diagnosis not present

## 2018-03-30 DIAGNOSIS — Z87442 Personal history of urinary calculi: Secondary | ICD-10-CM

## 2018-03-30 DIAGNOSIS — Z888 Allergy status to other drugs, medicaments and biological substances status: Secondary | ICD-10-CM

## 2018-03-30 DIAGNOSIS — I87309 Chronic venous hypertension (idiopathic) without complications of unspecified lower extremity: Secondary | ICD-10-CM | POA: Diagnosis present

## 2018-03-30 DIAGNOSIS — E119 Type 2 diabetes mellitus without complications: Secondary | ICD-10-CM | POA: Diagnosis present

## 2018-03-30 DIAGNOSIS — E785 Hyperlipidemia, unspecified: Secondary | ICD-10-CM | POA: Diagnosis present

## 2018-03-30 DIAGNOSIS — T8130XS Disruption of wound, unspecified, sequela: Secondary | ICD-10-CM

## 2018-03-30 DIAGNOSIS — M1710 Unilateral primary osteoarthritis, unspecified knee: Secondary | ICD-10-CM | POA: Diagnosis present

## 2018-03-30 DIAGNOSIS — Y838 Other surgical procedures as the cause of abnormal reaction of the patient, or of later complication, without mention of misadventure at the time of the procedure: Secondary | ICD-10-CM | POA: Diagnosis present

## 2018-03-30 DIAGNOSIS — Z6841 Body Mass Index (BMI) 40.0 and over, adult: Secondary | ICD-10-CM

## 2018-03-30 DIAGNOSIS — R1310 Dysphagia, unspecified: Secondary | ICD-10-CM | POA: Diagnosis not present

## 2018-03-30 DIAGNOSIS — I110 Hypertensive heart disease with heart failure: Secondary | ICD-10-CM | POA: Diagnosis present

## 2018-03-30 DIAGNOSIS — Z881 Allergy status to other antibiotic agents status: Secondary | ICD-10-CM

## 2018-03-30 DIAGNOSIS — E538 Deficiency of other specified B group vitamins: Secondary | ICD-10-CM | POA: Diagnosis present

## 2018-03-30 DIAGNOSIS — E43 Unspecified severe protein-calorie malnutrition: Secondary | ICD-10-CM | POA: Diagnosis present

## 2018-03-30 DIAGNOSIS — Z8711 Personal history of peptic ulcer disease: Secondary | ICD-10-CM

## 2018-03-30 DIAGNOSIS — Z882 Allergy status to sulfonamides status: Secondary | ICD-10-CM

## 2018-03-30 DIAGNOSIS — G4730 Sleep apnea, unspecified: Secondary | ICD-10-CM | POA: Diagnosis present

## 2018-03-30 DIAGNOSIS — L02415 Cutaneous abscess of right lower limb: Principal | ICD-10-CM | POA: Diagnosis present

## 2018-03-30 DIAGNOSIS — K219 Gastro-esophageal reflux disease without esophagitis: Secondary | ICD-10-CM | POA: Diagnosis present

## 2018-03-30 DIAGNOSIS — I509 Heart failure, unspecified: Secondary | ICD-10-CM | POA: Diagnosis present

## 2018-03-30 DIAGNOSIS — Z880 Allergy status to penicillin: Secondary | ICD-10-CM

## 2018-03-30 DIAGNOSIS — Z8249 Family history of ischemic heart disease and other diseases of the circulatory system: Secondary | ICD-10-CM

## 2018-03-30 DIAGNOSIS — Z833 Family history of diabetes mellitus: Secondary | ICD-10-CM

## 2018-03-30 HISTORY — PX: I & D EXTREMITY: SHX5045

## 2018-03-30 LAB — CBC
HCT: 33.9 % — ABNORMAL LOW (ref 36.0–46.0)
Hemoglobin: 9.8 g/dL — ABNORMAL LOW (ref 12.0–15.0)
MCH: 28.2 pg (ref 26.0–34.0)
MCHC: 28.9 g/dL — ABNORMAL LOW (ref 30.0–36.0)
MCV: 97.7 fL (ref 80.0–100.0)
Platelets: 237 10*3/uL (ref 150–400)
RBC: 3.47 MIL/uL — ABNORMAL LOW (ref 3.87–5.11)
RDW: 15.3 % (ref 11.5–15.5)
WBC: 4.8 10*3/uL (ref 4.0–10.5)
nRBC: 0 % (ref 0.0–0.2)

## 2018-03-30 LAB — GLUCOSE, CAPILLARY
Glucose-Capillary: 130 mg/dL — ABNORMAL HIGH (ref 70–99)
Glucose-Capillary: 99 mg/dL (ref 70–99)
Glucose-Capillary: 95 mg/dL (ref 70–99)
Glucose-Capillary: 146 mg/dL — ABNORMAL HIGH (ref 70–99)

## 2018-03-30 LAB — BASIC METABOLIC PANEL
Anion gap: 9 (ref 5–15)
BUN: 13 mg/dL (ref 8–23)
CO2: 28 mmol/L (ref 22–32)
Calcium: 8.6 mg/dL — ABNORMAL LOW (ref 8.9–10.3)
Chloride: 102 mmol/L (ref 98–111)
Creatinine, Ser: 1.22 mg/dL — ABNORMAL HIGH (ref 0.44–1.00)
GFR calc Af Amer: 53 mL/min — ABNORMAL LOW (ref >60)
GFR calc non Af Amer: 46 mL/min — ABNORMAL LOW (ref >60)
Glucose, Bld: 131 mg/dL — ABNORMAL HIGH (ref 70–99)
Potassium: 3.7 mmol/L (ref 3.5–5.1)
SODIUM: 139 mmol/L (ref 135–145)

## 2018-03-30 SURGERY — IRRIGATION AND DEBRIDEMENT EXTREMITY
Anesthesia: General | Site: Leg Lower | Laterality: Right

## 2018-03-30 MED ORDER — ONDANSETRON HCL 4 MG PO TABS: 4.0000 mg | ORAL_TABLET | Freq: Four times a day (QID) | ORAL | Status: DC | PRN

## 2018-03-30 MED ORDER — ONDANSETRON HCL 4 MG/2ML IJ SOLN
4.0000 mg | Freq: Four times a day (QID) | INTRAMUSCULAR | Status: DC | PRN
  Administered 2018-03-31: 4 mg via INTRAVENOUS
  Filled 2018-03-30: qty 2

## 2018-03-30 MED ORDER — FENTANYL CITRATE (PF) 100 MCG/2ML IJ SOLN
25.0000 ug | INTRAMUSCULAR | Status: DC | PRN
  Administered 2018-03-30: 50 ug via INTRAVENOUS

## 2018-03-30 MED ORDER — METOCLOPRAMIDE HCL 5 MG PO TABS: 5.0000 mg | ORAL_TABLET | Freq: Three times a day (TID) | ORAL | Status: DC | PRN

## 2018-03-30 MED ORDER — BISACODYL 10 MG RE SUPP: 10.0000 mg | Freq: Every day | RECTAL | Status: DC | PRN

## 2018-03-30 MED ORDER — OXYCODONE HCL 5 MG PO TABS
10.0000 mg | ORAL_TABLET | ORAL | Status: DC | PRN
  Administered 2018-03-30 – 2018-04-01 (×6): 15 mg via ORAL
  Administered 2018-04-01: 10 mg via ORAL
  Administered 2018-04-02 (×2): 15 mg via ORAL
  Administered 2018-04-02: 10 mg via ORAL
  Administered 2018-04-03 – 2018-04-05 (×7): 15 mg via ORAL
  Filled 2018-03-30 (×3): qty 3
  Filled 2018-03-30: qty 2
  Filled 2018-03-30 (×12): qty 3

## 2018-03-30 MED ORDER — CLINDAMYCIN PHOSPHATE 900 MG/50ML IV SOLN
900.0000 mg | INTRAVENOUS | Status: AC
  Administered 2018-03-30: 900 mg via INTRAVENOUS
  Filled 2018-03-30: qty 50

## 2018-03-30 MED ORDER — FENTANYL CITRATE (PF) 100 MCG/2ML IJ SOLN
INTRAMUSCULAR | Status: AC
  Filled 2018-03-30: qty 2

## 2018-03-30 MED ORDER — SPIRONOLACTONE 25 MG PO TABS
25.0000 mg | ORAL_TABLET | Freq: Every day | ORAL | Status: DC
  Administered 2018-03-30 – 2018-04-05 (×6): 25 mg via ORAL
  Filled 2018-03-30 (×6): qty 1

## 2018-03-30 MED ORDER — PROPOFOL 10 MG/ML IV BOLUS
INTRAVENOUS | Status: DC | PRN
  Administered 2018-03-30: 200 mg via INTRAVENOUS

## 2018-03-30 MED ORDER — SIMVASTATIN 20 MG PO TABS
40.0000 mg | ORAL_TABLET | Freq: Every day | ORAL | Status: DC
  Administered 2018-03-30 – 2018-04-03 (×5): 40 mg via ORAL
  Filled 2018-03-30 (×5): qty 2

## 2018-03-30 MED ORDER — ONDANSETRON HCL 4 MG/2ML IJ SOLN: 4.0000 mg | Freq: Once | INTRAMUSCULAR | Status: DC | PRN

## 2018-03-30 MED ORDER — ACETAMINOPHEN 325 MG PO TABS: 325.0000 mg | ORAL_TABLET | Freq: Four times a day (QID) | ORAL | Status: DC | PRN

## 2018-03-30 MED ORDER — POLYETHYLENE GLYCOL 3350 17 G PO PACK
17.0000 g | PACK | Freq: Every day | ORAL | Status: DC | PRN
  Administered 2018-04-02 – 2018-04-03 (×2): 17 g via ORAL
  Filled 2018-03-30 (×2): qty 1

## 2018-03-30 MED ORDER — 0.9 % SODIUM CHLORIDE (POUR BTL) OPTIME
TOPICAL | Status: DC | PRN
  Administered 2018-03-30 (×2): 1000 mL

## 2018-03-30 MED ORDER — MOMETASONE FURO-FORMOTEROL FUM 200-5 MCG/ACT IN AERO
2.0000 | INHALATION_SPRAY | Freq: Two times a day (BID) | RESPIRATORY_TRACT | Status: DC
  Administered 2018-03-30 – 2018-04-05 (×7): 2 via RESPIRATORY_TRACT
  Filled 2018-03-30 (×2): qty 8.8

## 2018-03-30 MED ORDER — LIDOCAINE 2% (20 MG/ML) 5 ML SYRINGE
INTRAMUSCULAR | Status: DC | PRN
  Administered 2018-03-30: 60 mg via INTRAVENOUS

## 2018-03-30 MED ORDER — HYDROXYZINE HCL 10 MG PO TABS
10.0000 mg | ORAL_TABLET | Freq: Three times a day (TID) | ORAL | Status: DC | PRN
  Filled 2018-03-30 (×2): qty 1

## 2018-03-30 MED ORDER — INSULIN ASPART 100 UNIT/ML ~~LOC~~ SOLN
0.0000 [IU] | Freq: Three times a day (TID) | SUBCUTANEOUS | Status: DC
  Administered 2018-03-31 – 2018-04-03 (×2): 2 [IU] via SUBCUTANEOUS
  Administered 2018-04-03: 3 [IU] via SUBCUTANEOUS
  Administered 2018-04-04 – 2018-04-05 (×3): 2 [IU] via SUBCUTANEOUS

## 2018-03-30 MED ORDER — FUROSEMIDE 40 MG PO TABS
120.0000 mg | ORAL_TABLET | Freq: Two times a day (BID) | ORAL | Status: DC
  Administered 2018-03-30 – 2018-04-05 (×10): 120 mg via ORAL
  Filled 2018-03-30 (×11): qty 3

## 2018-03-30 MED ORDER — SODIUM CHLORIDE 0.9 % IV SOLN
INTRAVENOUS | Status: DC
  Administered 2018-03-30: 18:00:00 via INTRAVENOUS

## 2018-03-30 MED ORDER — DOCUSATE SODIUM 100 MG PO CAPS
100.0000 mg | ORAL_CAPSULE | Freq: Two times a day (BID) | ORAL | Status: DC
  Administered 2018-03-30 – 2018-04-05 (×9): 100 mg via ORAL
  Filled 2018-03-30 (×10): qty 1

## 2018-03-30 MED ORDER — MEPERIDINE HCL 50 MG/ML IJ SOLN: 6.2500 mg | INTRAMUSCULAR | Status: DC | PRN

## 2018-03-30 MED ORDER — OXCARBAZEPINE 150 MG PO TABS
150.0000 mg | ORAL_TABLET | Freq: Two times a day (BID) | ORAL | Status: DC
  Administered 2018-03-30 – 2018-04-05 (×11): 150 mg via ORAL
  Filled 2018-03-30 (×12): qty 1

## 2018-03-30 MED ORDER — OXYCODONE HCL 5 MG PO TABS
5.0000 mg | ORAL_TABLET | ORAL | Status: DC | PRN
  Administered 2018-03-31 – 2018-04-04 (×5): 10 mg via ORAL
  Filled 2018-03-30 (×6): qty 2

## 2018-03-30 MED ORDER — METHOCARBAMOL 500 MG PO TABS
500.0000 mg | ORAL_TABLET | Freq: Four times a day (QID) | ORAL | Status: DC | PRN
  Administered 2018-03-30 – 2018-04-05 (×6): 500 mg via ORAL
  Filled 2018-03-30 (×6): qty 1

## 2018-03-30 MED ORDER — CHLORHEXIDINE GLUCONATE 4 % EX LIQD: 60.0000 mL | Freq: Once | CUTANEOUS | Status: DC

## 2018-03-30 MED ORDER — POTASSIUM CHLORIDE CRYS ER 20 MEQ PO TBCR
60.0000 meq | EXTENDED_RELEASE_TABLET | Freq: Every day | ORAL | Status: DC
  Administered 2018-03-30 – 2018-04-02 (×3): 60 meq via ORAL
  Filled 2018-03-30 (×3): qty 3

## 2018-03-30 MED ORDER — CLINDAMYCIN PHOSPHATE 600 MG/50ML IV SOLN
600.0000 mg | Freq: Four times a day (QID) | INTRAVENOUS | Status: AC
  Administered 2018-03-30 – 2018-03-31 (×3): 600 mg via INTRAVENOUS
  Filled 2018-03-30 (×3): qty 50

## 2018-03-30 MED ORDER — ONDANSETRON HCL 4 MG/2ML IJ SOLN
INTRAMUSCULAR | Status: DC | PRN
  Administered 2018-03-30: 4 mg via INTRAVENOUS

## 2018-03-30 MED ORDER — METHOCARBAMOL 1000 MG/10ML IJ SOLN
500.0000 mg | Freq: Four times a day (QID) | INTRAVENOUS | Status: DC | PRN
  Filled 2018-03-30: qty 5

## 2018-03-30 MED ORDER — ALBUTEROL SULFATE (2.5 MG/3ML) 0.083% IN NEBU: 3.0000 mL | INHALATION_SOLUTION | RESPIRATORY_TRACT | Status: DC | PRN

## 2018-03-30 MED ORDER — METOCLOPRAMIDE HCL 5 MG/ML IJ SOLN
5.0000 mg | Freq: Three times a day (TID) | INTRAMUSCULAR | Status: DC | PRN
  Administered 2018-03-31: 10 mg via INTRAVENOUS
  Filled 2018-03-30: qty 2

## 2018-03-30 MED ORDER — FENTANYL CITRATE (PF) 100 MCG/2ML IJ SOLN
INTRAMUSCULAR | Status: DC | PRN
  Administered 2018-03-30: 100 ug via INTRAVENOUS

## 2018-03-30 MED ORDER — MAGNESIUM CITRATE PO SOLN: 1.0000 | Freq: Once | ORAL | Status: DC | PRN

## 2018-03-30 MED ORDER — CARVEDILOL 25 MG PO TABS
25.0000 mg | ORAL_TABLET | Freq: Two times a day (BID) | ORAL | Status: DC
  Administered 2018-03-30 – 2018-04-05 (×11): 25 mg via ORAL
  Filled 2018-03-30 (×11): qty 1

## 2018-03-30 MED ORDER — PROPOFOL 10 MG/ML IV BOLUS
INTRAVENOUS | Status: AC
  Filled 2018-03-30: qty 20

## 2018-03-30 MED ORDER — HYDROMORPHONE HCL 1 MG/ML IJ SOLN: 0.5000 mg | INTRAMUSCULAR | Status: DC | PRN

## 2018-03-30 MED ORDER — FAMOTIDINE 20 MG PO TABS
20.0000 mg | ORAL_TABLET | Freq: Two times a day (BID) | ORAL | Status: DC
  Administered 2018-03-30 – 2018-04-05 (×11): 20 mg via ORAL
  Filled 2018-03-30 (×11): qty 1

## 2018-03-30 MED ORDER — FENTANYL CITRATE (PF) 250 MCG/5ML IJ SOLN
INTRAMUSCULAR | Status: AC
  Filled 2018-03-30: qty 5

## 2018-03-30 MED ORDER — SODIUM CHLORIDE 0.9 % IV SOLN
INTRAVENOUS | Status: DC
  Administered 2018-03-30: 14:00:00 via INTRAVENOUS

## 2018-03-30 SURGICAL SUPPLY — 30 items
BLADE SURG 21 STRL SS (BLADE) ×2 IMPLANT
BNDG COHESIVE 6X5 TAN STRL LF (GAUZE/BANDAGES/DRESSINGS) IMPLANT
BNDG GAUZE ELAST 4 BULKY (GAUZE/BANDAGES/DRESSINGS) ×2 IMPLANT
COVER SURGICAL LIGHT HANDLE (MISCELLANEOUS) ×3 IMPLANT
COVER WAND RF STERILE (DRAPES) ×2 IMPLANT
DRAPE U-SHAPE 47X51 STRL (DRAPES) ×2 IMPLANT
DRSG ADAPTIC 3X8 NADH LF (GAUZE/BANDAGES/DRESSINGS) ×1 IMPLANT
DURAPREP 26ML APPLICATOR (WOUND CARE) ×1 IMPLANT
ELECT REM PT RETURN 9FT ADLT (ELECTROSURGICAL)
ELECTRODE REM PT RTRN 9FT ADLT (ELECTROSURGICAL) IMPLANT
GAUZE SPONGE 4X4 12PLY STRL (GAUZE/BANDAGES/DRESSINGS) ×1 IMPLANT
GLOVE BIOGEL PI IND STRL 9 (GLOVE) ×1 IMPLANT
GLOVE BIOGEL PI INDICATOR 9 (GLOVE) ×1
GLOVE SURG ORTHO 9.0 STRL STRW (GLOVE) ×2 IMPLANT
GOWN STRL REUS W/ TWL XL LVL3 (GOWN DISPOSABLE) ×2 IMPLANT
GOWN STRL REUS W/TWL XL LVL3 (GOWN DISPOSABLE) ×2
HANDPIECE INTERPULSE COAX TIP (DISPOSABLE)
KIT BASIN OR (CUSTOM PROCEDURE TRAY) ×2 IMPLANT
KIT TURNOVER KIT B (KITS) ×2 IMPLANT
MANIFOLD NEPTUNE II (INSTRUMENTS) ×2 IMPLANT
NS IRRIG 1000ML POUR BTL (IV SOLUTION) ×2 IMPLANT
PACK ORTHO EXTREMITY (CUSTOM PROCEDURE TRAY) ×2 IMPLANT
PAD ARMBOARD 7.5X6 YLW CONV (MISCELLANEOUS) ×4 IMPLANT
SET HNDPC FAN SPRY TIP SCT (DISPOSABLE) IMPLANT
STOCKINETTE IMPERVIOUS 9X36 MD (GAUZE/BANDAGES/DRESSINGS) IMPLANT
SWAB COLLECTION DEVICE MRSA (MISCELLANEOUS) ×2 IMPLANT
SWAB CULTURE ESWAB REG 1ML (MISCELLANEOUS) IMPLANT
TOWEL OR 17X26 10 PK STRL BLUE (TOWEL DISPOSABLE) ×2 IMPLANT
TUBE CONNECTING 12X1/4 (SUCTIONS) ×2 IMPLANT
YANKAUER SUCT BULB TIP NO VENT (SUCTIONS) ×2 IMPLANT

## 2018-03-30 NOTE — H&P (Signed)
April [name] is an 68 y.o. female.   Chief Complaint: Persistent ulceration without interval healing. HPI: The patient is a 68 year old woman who is seen for postoperative follow-up following irrigation and debridement of her right lower leg ulcer with placement of a split thickness skin graft, allograft on 03/04/2018.  She is at [facility].  Her graft has fully incorporated over the past week but she is had increased foul-smelling drainage.  We have been utilizing Dynaflex layered compression wraps for edema control.  Past Medical History:  Diagnosis Date  . Anemia   . Asthma   . CAD (coronary artery disease)   . Carpal tunnel syndrome   . Cellulitis of both lower extremities 04/11/2015  . CHF (congestive heart failure) ([place])   . Colon polyp, hyperplastic 2007 & 2012  . Complication of anesthesia 1999   svt with renal calculi surgery, no problems since  . CTS (carpal tunnel syndrome)   . Diabetes mellitus   . Eczema   . GERD (gastroesophageal reflux disease)   . Hyperlipidemia   . Hypertension   . Leg ulcer ([place]) 04/24/2015   Right lateral leg No evidence of an infection Monitor closely Keep edema controlled   . Leg ulcer ([place])    right lower  . Meralgia paresthetica    Dr. [name]  . Morbid obesity ([place])   . Morbid obesity ([place])   . Neuropathy    toes and legs  . Osteoarthrosis, unspecified whether generalized or localized, lower leg   . PUD (peptic ulcer disease)   . Shortness of breath dyspnea    with exertion  . Sleep apnea    per progress note 02/25/2018  . Type II or unspecified type diabetes mellitus without mention of complication, not stated as uncontrolled   . Unspecified hereditary and idiopathic peripheral neuropathy   . Vitamin B12 deficiency   . Wears glasses   . Wound cellulitis    right upper leg, healing well    Past Surgical History:  Procedure Laterality Date  . ABDOMINAL HYSTERECTOMY    . CARDIAC CATHETERIZATION  2002   non obstructive disease  . colonoscopy with polypectomy  2007 & 2012    hyperplastic ;Dr Watt Climes  . COLONOSCOPY WITH PROPOFOL N/A 06/04/2015   Procedure: COLONOSCOPY WITH PROPOFOL;  Surgeon: Jerene Bears, MD;  Location: [facility];  Service: Gastroenterology;  Laterality: N/A;  . DEBRIDEMENT LEG Right 03/02/2018   WOUND VAC APPLIED  . DILATION AND CURETTAGE OF UTERUS     multiple  . HEMORRHOID SURGERY    . I&D EXTREMITY Right 03/02/2018   Procedure: RIGHT LEG DEBRIDEMENT AND PLACE VAC;  Surgeon: [name], MD;  Location: [facility];  Service: Orthopedics;  Laterality: Right;  . I&D EXTREMITY Right 03/04/2018   Procedure: REPEAT IRRIGATION AND DEBRIDEMENT RIGHT LEG, PLACE WOUND VAC;  Surgeon: [name], MD;  Location: [place] [place];  Service: Orthopedics;  Laterality: Right;  . renal calculi  12/1997   SVT with induction of anesthesia  . right knee arthroscopy    . SKIN SPLIT GRAFT Right 03/04/2018   Procedure: POSSIBLE SPLIT THICKNESS SKIN GRAFT;  Surgeon: [name], MD;  Location: [place];  Service: Orthopedics;  Laterality: Right;  . TONSILLECTOMY AND ADENOIDECTOMY      Family History  Problem Relation Age of Onset  . Colon cancer Mother   . Prostate cancer Father   . Colon cancer Father   . Diabetes Maternal Aunt   .  Breast cancer Maternal Aunt   . Diabetes Maternal Uncle   . Diabetes Paternal Aunt   . Stroke Paternal Aunt        > 65  . Heart disease Paternal Aunt   . Diabetes Paternal Uncle   . Breast cancer Maternal Aunt         X 2  . Breast cancer Cousin    Social History:  reports that she has never smoked. She has never used smokeless tobacco. She reports that she does not drink alcohol or use drugs.  Allergies:  Allergies  Allergen Reactions  . Penicillins Rash and Other (See Comments)    She was told not to take it anymore. Has patient had a PCN reaction causing immediate rash, facial/tongue/throat swelling, SOB or lightheadedness with hypotension: Yes Has  patient had a PCN reaction causing severe rash involving mucus membranes or skin necrosis: No Has patient had a PCN reaction that required hospitalization: No Has patient had a PCN reaction occurring within the last 10 years: No If all of the above answers are "NO", then may proceed with Cephalosporin use.'  . Sulfonamide Derivatives Anaphylaxis and Other (See Comments)    REACTION: internal "burning"  . Benazepril Hcl Other (See Comments)    Patient unsure of reaction.  . Dipyridamole Other (See Comments)    Unknown  . Estrogens Other (See Comments)    Unknown  . Hydrochlorothiazide Other (See Comments)    Unknown  . Hydrochlorothiazide W-Triamterene Other (See Comments)    Hypokalemia  . Lotensin [Benazepril Hcl] Other (See Comments)    Unknown  . Metronidazole Other (See Comments)    Patient unsure of reaction.  Marland Kitchen Spironolactone Other (See Comments)    "kidney problems"  . Sulfa Antibiotics Other (See Comments)    Unknown  . Torsemide Other (See Comments)    Patient unsure of reaction.  . Triamterene-Hctz Other (See Comments)    Unknown  . Valsartan Other (See Comments)    Patient unsure of reaction.    No medications prior to admission.    No results found for this or any previous visit (from the past 48 hour(s)). No results found.  Review of Systems  All other systems reviewed and are negative.   There were no vitals taken for this visit. Physical Exam  Patient is alert, oriented, no adenopathy, well-dressed, normal affect, normal respiratory effort. The right lower leg edema is controlled distally but she has massive edema proximally.  The meta honey was removed from the wound bed and had a large amount of green foul-smelling drainage over the area.  The wound bed has some yellow slough as well as very friable granulation.  She has palpable pedal pulses.  Silver alginate was applied to the wound bed and the leg was Ace wrapped.  The left lower extremity was also Ace  wrap for edema control for now.     Assessment/Plan 1. S/P split thickness skin graft   2. Idiopathic chronic venous hypertension of right lower extremity with ulcer and inflammation (HCC)   3. Body mass index 60.0-69.9, adult ([place])   4. Traumatic wound dehiscence, sequela   5. Severe protein-calorie malnutrition ([place])     Plan: Plan return to the OR for repeat irrigation and debridement and placement of VAC dressing, possible placement of split-thickness skin graft.   She will follow-up in the office the week following her surgical procedure.   [name], MD 03/30/2018, 7:02 AM

## 2018-03-30 NOTE — Op Note (Signed)
03/30/2018  4:30 PM  PATIENT:  April [name]    PRE-OPERATIVE DIAGNOSIS:  Ulcer Right Leg  POST-OPERATIVE DIAGNOSIS:  Same  PROCEDURE:  IRRIGATION AND DEBRIDEMENT RIGHT LEG, APPLY WOUND VAC  SURGEON:  [name], MD  PHYSICIAN ASSISTANT:None ANESTHESIA:   General  PREOPERATIVE INDICATIONS:  April [name] is a  68 y.o. female with a diagnosis of Ulcer Right Leg who failed conservative measures and elected for surgical management.    The risks benefits and alternatives were discussed with the patient preoperatively including but not limited to the risks of infection, bleeding, nerve injury, cardiopulmonary complications, the need for revision surgery, among others, and the patient was willing to proceed.  OPERATIVE IMPLANTS: Cleanse choice wound VAC 2 sponges  @ENCIMAGES @  OPERATIVE FINDINGS: Necrotic tissue over the right leg wound.  Necrotic tissue sent for cultures.  Wound measurement 15 x 7 cm.  OPERATIVE PROCEDURE: Patient was brought to the operating room and underwent a general anesthetic.  After adequate levels anesthesia were obtained patient's right lower extremity was prepped using Betadine paint and draped into a sterile field a timeout was called.  A Cobb elevator and rondure and 21 blade knife were used to debride the skin and soft tissue of fibrinous exudative tissue over the right leg wound.  Wound measurements after debridement was 15 x 7 cm.  There is good petechial bleeding after debridement.  The debrided tissue was sent for cultures.  The wound was irrigated with normal saline.  The 2 layer cleanse choice dressing was applied this had a good suction fit this was set for 10 cc of irrigation 10-minute dwell time 2 hours of suction.   DISCHARGE PLANNING:  Antibiotic duration: Continue antibiotics 24 hours postoperatively cultures pending  Weightbearing: Weightbearing as tolerated  Pain medication: Opioid pathway ordered  Dressing care/ Wound VAC: Cleanse  choice wound VAC continue until follow-up and surgery on Friday  Ambulatory devices: Walker  Discharge to: Anticipate discharge back to skilled nursing with repeat surgery on Friday with skin graft.  Follow-up: In the office 1 week post operative.

## 2018-03-30 NOTE — Anesthesia Postprocedure Evaluation (Signed)
Anesthesia Post Note  Patient: April [name]  Procedure(s) Performed: IRRIGATION AND DEBRIDEMENT RIGHT LEG, APPLY WOUND VAC (Right Leg Lower)     Patient location during evaluation: PACU Anesthesia Type: General Level of consciousness: awake and alert and oriented Pain management: pain level controlled Vital Signs Assessment: post-procedure vital signs reviewed and stable Respiratory status: spontaneous breathing, nonlabored ventilation and respiratory function stable Cardiovascular status: blood pressure returned to baseline and stable Postop Assessment: no apparent nausea or vomiting Anesthetic complications: no    Last Vitals:  Vitals:   03/30/18 1213 03/30/18 1630  BP: (!) 144/68 134/68  Pulse:  73  Resp:  15  Temp:  36.5 C  SpO2:  98%    Last Pain:  Vitals:   03/30/18 1630  TempSrc:   PainSc: Asleep                 Calder Oblinger A.

## 2018-03-30 NOTE — Anesthesia Preprocedure Evaluation (Signed)
Anesthesia Evaluation  Patient identified by MRN, date of birth, ID band Patient awake    Reviewed: Allergy & Precautions, NPO status , Patient's Chart, lab work & pertinent test results, reviewed documented beta blocker date and time   History of Anesthesia Complications (+) history of anesthetic complications  Airway Mallampati: II  TM Distance: >3 FB Neck ROM: Full    Dental no notable dental hx. (+) Teeth Intact   Pulmonary shortness of breath, with exertion and at rest, asthma , sleep apnea ,    Pulmonary exam normal breath sounds clear to auscultation       Cardiovascular hypertension, Pt. on medications and Pt. on home beta blockers + CAD and +CHF  Normal cardiovascular exam Rhythm:Regular Rate:Normal     Neuro/Psych Diabetic neuropathy Meralgia paresthetica  Neuromuscular disease negative psych ROS   GI/Hepatic Neg liver ROS, PUD, GERD  Medicated and Controlled,  Endo/Other  diabetes, Well Controlled, Type 2, Oral Hypoglycemic Agents  Renal/GU Renal InsufficiencyRenal disease  negative genitourinary   Musculoskeletal  (+) Arthritis , Osteoarthritis,  Ulcer right lower leg   Abdominal (+) + obese,   Peds  Hematology  (+) anemia ,   Anesthesia Other Findings   Reproductive/Obstetrics                             Anesthesia Physical Anesthesia Plan  ASA: III  Anesthesia Plan: General   Post-op Pain Management:    Induction: Intravenous  PONV Risk Score and Plan: 4 or greater and Dexamethasone, Treatment may vary due to age or medical condition and Ondansetron  Airway Management Planned: LMA  Additional Equipment:   Intra-op Plan:   Post-operative Plan: Extubation in OR  Informed Consent: I have reviewed the patients History and Physical, chart, labs and discussed the procedure including the risks, benefits and alternatives for the proposed anesthesia with the patient or  authorized representative who has indicated his/her understanding and acceptance.     Dental advisory given  Plan Discussed with: CRNA and Surgeon  Anesthesia Plan Comments:         Anesthesia Quick Evaluation

## 2018-03-30 NOTE — Transfer of Care (Signed)
Immediate Anesthesia Transfer of Care Note  Patient: April [name]  Procedure(s) Performed: IRRIGATION AND DEBRIDEMENT RIGHT LEG, APPLY WOUND VAC (Right Leg Lower)  Patient Location: PACU  Anesthesia Type:General  Level of Consciousness: awake, alert  and oriented  Airway & Oxygen Therapy: Patient Spontanous Breathing and Patient connected to nasal cannula oxygen  Post-op Assessment: Report given to RN and Post -op Vital signs reviewed and stable  Post vital signs: Reviewed and stable  Last Vitals:  Vitals Value Taken Time  BP    Temp    Pulse 74 03/30/2018  4:29 PM  Resp 19 03/30/2018  4:29 PM  SpO2 98 % 03/30/2018  4:29 PM  Vitals shown include unvalidated device data.  Last Pain:  Vitals:   03/30/18 1311  TempSrc:   PainSc: 10-Worst pain ever      Patients Stated Pain Goal: 1 (60/16/58 0063)  Complications: No apparent anesthesia complications

## 2018-03-30 NOTE — Anesthesia Procedure Notes (Signed)
Procedure Name: LMA Insertion Date/Time: 03/30/2018 3:59 PM Performed by: Trinna Post., CRNA Pre-anesthesia Checklist: Patient identified, Emergency Drugs available, Suction available, Patient being monitored and Timeout performed Patient Re-evaluated:Patient Re-evaluated prior to induction Oxygen Delivery Method: Circle system utilized Preoxygenation: Pre-oxygenation with 100% oxygen Induction Type: IV induction Ventilation: Mask ventilation without difficulty LMA: LMA inserted LMA Size: 4.0 Number of attempts: 1 Placement Confirmation: positive ETCO2 and breath sounds checked- equal and bilateral Tube secured with: Tape Dental Injury: Teeth and Oropharynx as per pre-operative assessment

## 2018-03-31 ENCOUNTER — Encounter (HOSPITAL_COMMUNITY): Payer: Self-pay | Admitting: Orthopedic Surgery

## 2018-03-31 ENCOUNTER — Ambulatory Visit (INDEPENDENT_AMBULATORY_CARE_PROVIDER_SITE_OTHER): Payer: Self-pay | Admitting: Physician Assistant

## 2018-03-31 LAB — GLUCOSE, CAPILLARY
Glucose-Capillary: 108 mg/dL — ABNORMAL HIGH (ref 70–99)
Glucose-Capillary: 132 mg/dL — ABNORMAL HIGH (ref 70–99)
Glucose-Capillary: 125 mg/dL — ABNORMAL HIGH (ref 70–99)
Glucose-Capillary: 116 mg/dL — ABNORMAL HIGH (ref 70–99)

## 2018-03-31 MED ORDER — ENSURE MAX PROTEIN PO LIQD
11.0000 [oz_av] | Freq: Two times a day (BID) | ORAL | Status: DC
  Administered 2018-03-31 – 2018-04-05 (×8): 11 [oz_av] via ORAL
  Filled 2018-03-31 (×13): qty 330

## 2018-03-31 MED ORDER — CLINDAMYCIN PHOSPHATE 900 MG/50ML IV SOLN
900.0000 mg | INTRAVENOUS | Status: AC
  Administered 2018-04-01: 900 mg via INTRAVENOUS
  Filled 2018-03-31 (×2): qty 50

## 2018-03-31 MED ORDER — CHLORHEXIDINE GLUCONATE 4 % EX LIQD
60.0000 mL | Freq: Once | CUTANEOUS | Status: AC
  Administered 2018-04-01: 4 via TOPICAL

## 2018-03-31 NOTE — Social Work (Signed)
CSW acknowledging consult for SNF placement. Will follow for therapy recommendations.   Orrie Schubert, MSW, LCSWA [facility] Clinical Social Work [phone]   

## 2018-03-31 NOTE — Plan of Care (Signed)
  Problem: Education: Goal: Knowledge of General Education information will improve Description Including pain rating scale, medication(s)/side effects and non-pharmacologic comfort measures Outcome: Progressing   Problem: Pain Managment: Goal: General experience of comfort will improve Outcome: Progressing   Problem: Safety: Goal: Ability to remain free from injury will improve Outcome: Progressing   Problem: Skin Integrity: Goal: Risk for impaired skin integrity will decrease Outcome: Progressing   Problem: Health Behavior/Discharge Planning: Goal: Ability to manage health-related needs will improve Outcome: Progressing

## 2018-03-31 NOTE — H&P (View-Only) (Signed)
Patient ID: April [name], female   DOB: April 30, 1950, 68 y.o.   MRN: [id number] Patient without complaints this morning.  Wound VAC is functioning well no leaks.  Will order protein supplements.  Plan for return to the operating room Friday with possible split-thickness skin graft.

## 2018-03-31 NOTE — Progress Notes (Addendum)
Patient ID: April [name], female   DOB: May 18, 1950, 68 y.o.   MRN: [id number] Patient without complaints this morning.  Wound VAC is functioning well no leaks.  Will order protein supplements.  Plan for return to the operating room Friday with possible split-thickness skin graft.

## 2018-03-31 NOTE — Progress Notes (Signed)
Physical Therapy Evaluation  Clinical Impression: PTA pt resident at [facility] where she was able to pivot transfer to w/c for mobility and requires assist for ADLs. Pt currently limited in safe mobility by increased pain, and decreased strength and ROM. Pt requires modA for bed mobility to manage LE into and out of bed. Pt to return to surgery tomorrow. PT recommends d/c back to SNF when medically ready. PT will continue to follow acutely.     03/31/18 1200  PT Visit Information  Last PT Received On 03/31/18  Assistance Needed +2  History of Present Illness 68 year old woman who is seen for postoperative follow-up following irrigation and debridement of her right lower leg ulcer with placement of a split thickness skin graft, allograft on 03/04/2018.  She is at [facility].  Her graft has fully incorporated over the past week but she is had increased foul-smelling drainage. s/p R LE wound I&D 03/30/18 Follow up surgery scheduled 04/01/18  Precautions  Precautions Fall  Precaution Comments Wound vac in place   Restrictions  Weight Bearing Restrictions Yes  RLE Weight Bearing WBAT  Home Living  Family/patient expects to be discharged to: Skilled nursing facility  Prior Function  Level of Independence Needs assistance  Gait / Transfers Assistance Needed pivot transfer to [facility] with assistance  ADL's / Homemaking Assistance Needed requires assist with ADLs  Communication  Communication No difficulties  Pain Assessment  Pain Assessment 0-10  Pain Score 10  Pain Location R LE  Pain Descriptors / Indicators Aching;Constant;Grimacing;Guarding;Sore;Throbbing  Pain Intervention(s) Limited activity within patient's tolerance;Monitored during session;Repositioned;Patient requesting pain meds-RN notified  Cognition  Arousal/Alertness Awake/alert  Behavior During Therapy WFL for tasks assessed/performed  Overall Cognitive Status Within Functional Limits for  tasks assessed  Upper Extremity Assessment  Upper Extremity Assessment Generalized weakness  Lower Extremity Assessment  Lower Extremity Assessment LLE deficits/detail;RLE deficits/detail  RLE Deficits / Details decrease ROM secondary to body habitus, R LE wrapped in coban with Wound vac in place  RLE Unable to fully assess due to pain  LLE Deficits / Details decreased ROM secondary to body habitus, LE wrapped in coban  Bed Mobility  Overal bed mobility Needs Assistance  Bed Mobility Supine to Sit;Sit to Supine  Supine to sit HOB elevated;Mod assist  Sit to supine Mod assist;+2 for physical assistance  General bed mobility comments modA for management of bilateral LE to floor and hips to EoB, pt able to utilize bedrail to pull to EoB and to bring trunk into upright, pt requires modAx2 for bringing LE into bed and squaring body to bed  Balance  Overall balance assessment Needs assistance  Sitting-balance support Bilateral upper extremity supported;Feet supported;Single extremity supported  Sitting balance-Leahy Scale Poor  Sitting balance - Comments requires UE assist to maintain balance able to tolerate sitting EoB for 5 min   General Comments  General comments (skin integrity, edema, etc.) L LE skin dried and painful to touch. Pt on 2L O2 via [place] on entry with SaO2 96%O2 pt reports no supplemental O2 at home. With sitting EoB dropped to 86%O2 on RA returned 2L O2 via [place] and SaO2 returned to 94%  PT - End of Session  Equipment Utilized During Treatment Oxygen  Activity Tolerance Patient limited by pain  Patient left in bed;with call bell/phone within reach;with nursing/sitter in room  Nurse Communication Mobility status  PT Assessment  PT Recommendation/Assessment Patient needs continued PT services  PT Visit Diagnosis Muscle weakness (generalized) (M62.81);Difficulty  in walking, not elsewhere classified (R26.2);Pain  Pain - Right/Left Right  Pain - part of body Leg  PT Problem List  Decreased strength;Decreased activity tolerance;Decreased balance;Decreased mobility;Decreased knowledge of use of DME;Obesity;Pain;Decreased skin integrity  Barriers to Discharge Inaccessible home environment;Decreased caregiver support  PT Plan  PT Frequency (ACUTE ONLY) Min 3X/week  PT Treatment/Interventions (ACUTE ONLY) DME instruction;Gait training;Stair training;Functional mobility training;Therapeutic activities;Therapeutic exercise;Balance training;Patient/family education  AM-PAC PT "6 Clicks" Mobility Outcome Measure (Version 2)  Help needed turning from your back to your side while in a flat bed without using bedrails? 2  Help needed moving from lying on your back to sitting on the side of a flat bed without using bedrails? 2  Help needed moving to and from a bed to a chair (including a wheelchair)? 1  Help needed standing up from a chair using your arms (e.g., wheelchair or bedside chair)? 1  Help needed to walk in hospital room? 1  Help needed climbing 3-5 steps with a railing?  1  6 Click Score 8  Consider Recommendation of Discharge To: CIR/SNF/LTACH  PT Recommendation  Follow Up Recommendations SNF;Supervision/Assistance - 24 hour  PT equipment None recommended by PT  Individuals Consulted  Consulted and Agree with Results and Recommendations Patient  Acute Rehab PT Goals  Patient Stated Goal have less pain  PT Goal Formulation With patient  Time For Goal Achievement 04/14/18  Potential to Achieve Goals Fair  PT Time Calculation  PT Start Time (ACUTE ONLY) 1149  PT Stop Time (ACUTE ONLY) 1208  PT Time Calculation (min) (ACUTE ONLY) 19 min  PT General Charges  $$ ACUTE PT VISIT 1 Visit  PT Evaluation  $PT Eval Moderate Complexity 1 Mod  Written Expression  Dominant Hand Right   April [name] B. Migdalia Dk PT, DPT Acute Rehabilitation Services Pager [phone] Office [phone]

## 2018-04-01 ENCOUNTER — Inpatient Hospital Stay (HOSPITAL_COMMUNITY): Payer: Medicare Other | Admitting: Certified Registered Nurse Anesthetist

## 2018-04-01 ENCOUNTER — Encounter (HOSPITAL_COMMUNITY): Payer: Self-pay | Admitting: Certified Registered"

## 2018-04-01 ENCOUNTER — Ambulatory Visit: Payer: Medicare Other

## 2018-04-01 ENCOUNTER — Encounter (HOSPITAL_COMMUNITY)
Admission: RE | Disposition: A | Discharge status: Skilled Nursing Facility | Payer: Self-pay | Source: Home / Self Care | Attending: Orthopedic Surgery

## 2018-04-01 HISTORY — PX: SKIN SPLIT GRAFT: SHX444

## 2018-04-01 LAB — GLUCOSE, CAPILLARY
Glucose-Capillary: 123 mg/dL — ABNORMAL HIGH (ref 70–99)
Glucose-Capillary: 106 mg/dL — ABNORMAL HIGH (ref 70–99)
Glucose-Capillary: 87 mg/dL (ref 70–99)
Glucose-Capillary: 108 mg/dL — ABNORMAL HIGH (ref 70–99)
Glucose-Capillary: 94 mg/dL (ref 70–99)
Glucose-Capillary: 124 mg/dL — ABNORMAL HIGH (ref 70–99)

## 2018-04-01 LAB — SURGICAL PCR SCREEN
MRSA, PCR: NEGATIVE
Staphylococcus aureus: NEGATIVE

## 2018-04-01 SURGERY — APPLICATION, GRAFT, SKIN, SPLIT-THICKNESS
Anesthesia: General | Laterality: Right

## 2018-04-01 MED ORDER — FENTANYL CITRATE (PF) 100 MCG/2ML IJ SOLN
INTRAMUSCULAR | Status: AC
  Administered 2018-04-01: 50 ug via INTRAVENOUS
  Filled 2018-04-01: qty 2

## 2018-04-01 MED ORDER — OXYCODONE HCL 5 MG/5ML PO SOLN: 5.0000 mg | Freq: Once | ORAL | Status: DC | PRN

## 2018-04-01 MED ORDER — FENTANYL CITRATE (PF) 100 MCG/2ML IJ SOLN
INTRAMUSCULAR | Status: DC | PRN
  Administered 2018-04-01 (×2): 50 ug via INTRAVENOUS

## 2018-04-01 MED ORDER — SODIUM CHLORIDE 0.9 % IV SOLN
INTRAVENOUS | Status: DC
  Administered 2018-04-01: 23:00:00 via INTRAVENOUS

## 2018-04-01 MED ORDER — LACTATED RINGERS IV SOLN
INTRAVENOUS | Status: DC
  Administered 2018-04-01: 10:00:00 via INTRAVENOUS

## 2018-04-01 MED ORDER — FENTANYL CITRATE (PF) 100 MCG/2ML IJ SOLN
INTRAMUSCULAR | Status: AC
  Filled 2018-04-01: qty 2

## 2018-04-01 MED ORDER — 0.9 % SODIUM CHLORIDE (POUR BTL) OPTIME
TOPICAL | Status: DC | PRN
  Administered 2018-04-01 (×2): 1000 mL

## 2018-04-01 MED ORDER — ONDANSETRON HCL 4 MG/2ML IJ SOLN
INTRAMUSCULAR | Status: DC | PRN
  Administered 2018-04-01: 4 mg via INTRAVENOUS

## 2018-04-01 MED ORDER — LIDOCAINE 2% (20 MG/ML) 5 ML SYRINGE
INTRAMUSCULAR | Status: AC
  Filled 2018-04-01: qty 5

## 2018-04-01 MED ORDER — FENTANYL CITRATE (PF) 100 MCG/2ML IJ SOLN
25.0000 ug | INTRAMUSCULAR | Status: DC | PRN
  Administered 2018-04-01: 25 ug via INTRAVENOUS

## 2018-04-01 MED ORDER — FENTANYL CITRATE (PF) 250 MCG/5ML IJ SOLN
INTRAMUSCULAR | Status: AC
  Filled 2018-04-01: qty 5

## 2018-04-01 MED ORDER — PROPOFOL 10 MG/ML IV BOLUS
INTRAVENOUS | Status: DC | PRN
  Administered 2018-04-01: 160 mg via INTRAVENOUS

## 2018-04-01 MED ORDER — OXYCODONE HCL 5 MG PO TABS: 5.0000 mg | ORAL_TABLET | Freq: Once | ORAL | Status: DC | PRN

## 2018-04-01 MED ORDER — PROPOFOL 10 MG/ML IV BOLUS
INTRAVENOUS | Status: AC
  Filled 2018-04-01: qty 20

## 2018-04-01 MED ORDER — ONDANSETRON HCL 4 MG/2ML IJ SOLN: 4.0000 mg | Freq: Four times a day (QID) | INTRAMUSCULAR | Status: DC | PRN

## 2018-04-01 MED ORDER — PHENYLEPHRINE 40 MCG/ML (10ML) SYRINGE FOR IV PUSH (FOR BLOOD PRESSURE SUPPORT)
PREFILLED_SYRINGE | INTRAVENOUS | Status: DC | PRN
  Administered 2018-04-01: 80 ug via INTRAVENOUS

## 2018-04-01 MED ORDER — LIDOCAINE 2% (20 MG/ML) 5 ML SYRINGE
INTRAMUSCULAR | Status: DC | PRN
  Administered 2018-04-01: 60 mg via INTRAVENOUS

## 2018-04-01 MED ORDER — FENTANYL CITRATE (PF) 100 MCG/2ML IJ SOLN
50.0000 ug | Freq: Once | INTRAMUSCULAR | Status: AC
  Administered 2018-04-01: 50 ug via INTRAVENOUS

## 2018-04-01 SURGICAL SUPPLY — 49 items
ALLOGRAFT SKIN MESHD 125 SQ CM (Tissue) ×1 IMPLANT
BNDG CMPR 9X4 STRL LF SNTH (GAUZE/BANDAGES/DRESSINGS) ×1
BNDG COHESIVE 6X5 TAN STRL LF (GAUZE/BANDAGES/DRESSINGS) ×2 IMPLANT
BNDG ESMARK 4X9 LF (GAUZE/BANDAGES/DRESSINGS) ×3 IMPLANT
BNDG GAUZE STRTCH 6 (GAUZE/BANDAGES/DRESSINGS) IMPLANT
CANISTER WOUNDNEG PRESSURE 500 (CANNISTER) ×2 IMPLANT
COVER SURGICAL LIGHT HANDLE (MISCELLANEOUS) ×6 IMPLANT
COVER WAND RF STERILE (DRAPES) ×3 IMPLANT
CUFF TOURNIQUET SINGLE 18IN (TOURNIQUET CUFF) IMPLANT
CUFF TOURNIQUET SINGLE 24IN (TOURNIQUET CUFF) IMPLANT
DERMACARRIERS GRAFT 1 TO 1.5 (DISPOSABLE)
DRAPE U-SHAPE 47X51 STRL (DRAPES) ×3 IMPLANT
DRESSING VERAFLO CLEANSE CC (GAUZE/BANDAGES/DRESSINGS) IMPLANT
DRSG ADAPTIC 3X8 NADH LF (GAUZE/BANDAGES/DRESSINGS) IMPLANT
DRSG MEPITEL 4X7.2 (GAUZE/BANDAGES/DRESSINGS) ×3 IMPLANT
DRSG VERAFLO CLEANSE CC (GAUZE/BANDAGES/DRESSINGS) ×3
DURAPREP 26ML APPLICATOR (WOUND CARE) ×3 IMPLANT
ELECT REM PT RETURN 9FT ADLT (ELECTROSURGICAL) ×3
ELECTRODE REM PT RTRN 9FT ADLT (ELECTROSURGICAL) ×1 IMPLANT
GAUZE SPONGE 4X4 12PLY STRL (GAUZE/BANDAGES/DRESSINGS) IMPLANT
GLOVE BIOGEL PI IND STRL 9 (GLOVE) ×1 IMPLANT
GLOVE BIOGEL PI INDICATOR 9 (GLOVE) ×2
GLOVE SURG ORTHO 9.0 STRL STRW (GLOVE) ×3 IMPLANT
GOWN STRL REUS W/ TWL XL LVL3 (GOWN DISPOSABLE) ×2 IMPLANT
GOWN STRL REUS W/TWL XL LVL3 (GOWN DISPOSABLE) ×6
GRAFT DERMACARRIERS 1 TO 1.5 (DISPOSABLE) IMPLANT
GRAFT TISS MESH 125 BURN (Tissue) IMPLANT
KIT BASIN OR (CUSTOM PROCEDURE TRAY) ×3 IMPLANT
KIT TURNOVER KIT B (KITS) ×3 IMPLANT
MANIFOLD NEPTUNE II (INSTRUMENTS) ×3 IMPLANT
NDL HYPO 25GX1X1/2 BEV (NEEDLE) IMPLANT
NEEDLE HYPO 25GX1X1/2 BEV (NEEDLE) IMPLANT
NS IRRIG 1000ML POUR BTL (IV SOLUTION) ×3 IMPLANT
PACK ORTHO EXTREMITY (CUSTOM PROCEDURE TRAY) ×3 IMPLANT
PAD ARMBOARD 7.5X6 YLW CONV (MISCELLANEOUS) ×6 IMPLANT
PAD CAST 4YDX4 CTTN HI CHSV (CAST SUPPLIES) IMPLANT
PAD NEG PRESSURE SENSATRAC (MISCELLANEOUS) ×2 IMPLANT
PADDING CAST COTTON 4X4 STRL (CAST SUPPLIES)
SKIN MESHED 125 SQ CM (Tissue) ×3 IMPLANT
SUCTION FRAZIER HANDLE 10FR (MISCELLANEOUS)
SUCTION TUBE FRAZIER 10FR DISP (MISCELLANEOUS) IMPLANT
SUT ETHILON 2 0 PSLX (SUTURE) ×4 IMPLANT
SUT ETHILON 4 0 PS 2 18 (SUTURE) IMPLANT
SYR CONTROL 10ML LL (SYRINGE) IMPLANT
TOWEL OR 17X24 6PK STRL BLUE (TOWEL DISPOSABLE) ×3 IMPLANT
TOWEL OR 17X26 10 PK STRL BLUE (TOWEL DISPOSABLE) ×3 IMPLANT
TUBE CONNECTING 12'X1/4 (SUCTIONS)
TUBE CONNECTING 12X1/4 (SUCTIONS) IMPLANT
WATER STERILE IRR 1000ML POUR (IV SOLUTION) ×3 IMPLANT

## 2018-04-01 NOTE — NC FL2 (Signed)
[place] MEDICAID FL2 LEVEL OF CARE SCREENING TOOL     IDENTIFICATION  Patient Name: April [name] Birthdate: August 09, 1950 Sex: female Admission Date (Current Location): 03/30/2018  [facility] and [place] Number:  Herbalist and Address:  [facility] [place] [address] [place], [place] [address]      Provider Number: [id number]  Attending Physician Name and Address:  [name], MD  Relative Name and Phone Number:       Current Level of Care: Hospital Recommended Level of Care: [place] Prior Approval Number:    Date Approved/Denied:   PASRR Number: [id number]  Discharge Plan: SNF    Current Diagnoses: Patient Active Problem List   Diagnosis Date Noted  . Traumatic wound dehiscence   . Wound of right leg 02/18/2018  . Blister 02/18/2018  . Cellulitis of right leg 01/14/2018  . Skin lumps 01/14/2018  . Bilateral leg cramps 12/14/2017  . Dry skin dermatitis 06/29/2017  . Left shoulder pain 08/14/2016  . Meralgia paresthetica 07/16/2016  . Chronic left-sided low back pain with left-sided sciatica 06/02/2016  . Venous stasis ulcer ([place]) 05/13/2016  . Abscess of right lower leg 03/28/2016  . Whole body pain 03/20/2016  . Diabetic peripheral neuropathy ([place]) 03/20/2016  . Osteopenia 01/11/2016  . CKD (chronic kidney disease) stage 3, GFR 30-59 ml/min (HCC) 01/02/2016  . Hand paresthesia 07/18/2015  . Carpal tunnel syndrome 06/07/2015  . Family history of colon cancer   . Diabetes mellitus with neurological manifestations ([place]) 04/18/2015  . Bilateral leg edema 04/11/2015  . Cellulitis of leg, right 04/11/2015  . Abnormality of gait 01/03/2015  . Hereditary and idiopathic peripheral neuropathy 01/03/2015  . GERD (gastroesophageal reflux disease) 06/17/2014  . Morbid obesity with BMI of 50.0-59.9, adult ([place]) 09/05/2013  . Hx of colonic polyps 12/15/2012  . Vitamin B12 deficiency 11/03/2012  . Intrinsic asthma  03/23/2012  . Chronic diastolic heart failure ([place]) 02/20/2011  . OSA (obstructive sleep apnea) 09/17/2010  . URINARY URGENCY 01/08/2010  . CAD, NATIVE VESSEL 11/20/2008  . OSTEOARTHRITIS, KNEES, BILATERAL, SEVERE 06/14/2008  . Hyperlipidemia 05/10/2007  . Essential hypertension 01/18/2007  . HYPOKALEMIA 04/30/2006  . Body mass index 60.0-69.9, adult ([place]) 04/30/2006  . HX, PERSONAL, PEPTIC ULCER DISEASE 04/30/2006    Orientation RESPIRATION BLADDER Height & Weight     Self, Time, Situation, Place  O2(Nasal Canula 2 L) Continent, External catheter Weight: (!) 360 lb (163.3 kg) Height:  5\' 4"  (162.6 cm)  BEHAVIORAL SYMPTOMS/MOOD NEUROLOGICAL BOWEL NUTRITION STATUS  (None) (None) Continent Diet(Carb modified)  AMBULATORY STATUS COMMUNICATION OF NEEDS Skin   Extensive Assist Verbally Other (Comment), Surgical wounds, Wound Vac(Blister, Cracking, MASD. Wound vac on right lower leg.)                       Personal Care Assistance Level of Assistance  Bathing, Feeding, Dressing Bathing Assistance: Limited assistance Feeding assistance: Limited assistance Dressing Assistance: Limited assistance     Functional Limitations Info  Sight, Hearing, Speech Sight Info: Adequate Hearing Info: Adequate Speech Info: Adequate    SPECIAL CARE FACTORS FREQUENCY  PT (By licensed PT)     PT Frequency: 5 x week              Contractures Contractures Info: Not present    Additional Factors Info  Code Status, Allergies Code Status Info: Full code Allergies Info: Penicillins, Shellfish Allergy, Sulfonamide Derivatives, Dipyridamole, Estrogens, Hydrochlorothiazide, Hydrochlorothiazide W-triamterene, Lotensin (Benazepril  Hcl), Metronidazole, Mustard (Allyl Isothiocyanate), Spironolactone, Sulfa Antibiotics, Torsemide, Valsartan           Current Medications (04/01/2018):  This is the current hospital active medication list Current Facility-Administered Medications  Medication Dose  Route Frequency Provider Last Rate Last Dose  . 0.9 %  sodium chloride infusion   Intravenous Continuous Rayburn, Neta Mends, PA-C      . acetaminophen (TYLENOL) tablet 325-650 mg  325-650 mg Oral Q6H PRN Rayburn, Neta Mends, PA-C      . albuterol (PROVENTIL) (2.5 MG/3ML) 0.083% nebulizer solution 3 mL  3 mL Inhalation Q4H PRN Rayburn, Neta Mends, PA-C      . bisacodyl (DULCOLAX) suppository 10 mg  10 mg Rectal Daily PRN Rayburn, Neta Mends, PA-C      . carvedilol (COREG) tablet 25 mg  25 mg Oral BID WC Rayburn, Neta Mends, PA-C   25 mg at 04/01/18 0849  . docusate sodium (COLACE) capsule 100 mg  100 mg Oral BID Rayburn, Neta Mends, PA-C   100 mg at 03/31/18 1010  . famotidine (PEPCID) tablet 20 mg  20 mg Oral BID Rayburn, Neta Mends, PA-C   20 mg at 03/31/18 2143  . furosemide (LASIX) tablet 120 mg  120 mg Oral BID Rayburn, Neta Mends, PA-C   120 mg at 03/31/18 1818  . HYDROmorphone (DILAUDID) injection 0.5-1 mg  0.5-1 mg Intravenous Q4H PRN Rayburn, Neta Mends, PA-C      . hydrOXYzine (ATARAX/VISTARIL) tablet 10 mg  10 mg Oral TID PRN Rayburn, Neta Mends, PA-C      . insulin aspart (novoLOG) injection 0-15 Units  0-15 Units Subcutaneous TID WC Rayburn, Neta Mends, PA-C   2 Units at 03/31/18 1241  . lactated ringers infusion   Intravenous Continuous Rayburn, Neta Mends, PA-C 10 mL/hr at 04/01/18 1022    . magnesium citrate solution 1 Bottle  1 Bottle Oral Once PRN Rayburn, Neta Mends, PA-C      . methocarbamol (ROBAXIN) tablet 500 mg  500 mg Oral Q6H PRN Rayburn, Neta Mends, PA-C   500 mg at 03/30/18 1814   Or  . methocarbamol (ROBAXIN) 500 mg in dextrose 5 % 50 mL IVPB  500 mg Intravenous Q6H PRN Rayburn, Neta Mends, PA-C      . metoCLOPramide (REGLAN) tablet 5-10 mg  5-10 mg Oral Q8H PRN Rayburn, Neta Mends, PA-C       Or  . metoCLOPramide (REGLAN) injection 5-10 mg  5-10 mg Intravenous Q8H PRN  Rayburn, Neta Mends, PA-C   10 mg at 03/31/18 1035  . mometasone-formoterol (DULERA) 200-5 MCG/ACT inhaler 2 puff  2 puff Inhalation BID Rayburn, Neta Mends, PA-C   2 puff at 03/31/18 0844  . ondansetron (ZOFRAN) tablet 4 mg  4 mg Oral Q6H PRN Rayburn, Neta Mends, PA-C       Or  . ondansetron (ZOFRAN) injection 4 mg  4 mg Intravenous Q6H PRN Rayburn, Neta Mends, PA-C   4 mg at 03/31/18 1308  . OXcarbazepine (TRILEPTAL) tablet 150 mg  150 mg Oral BID Rayburn, Neta Mends, PA-C   150 mg at 03/31/18 2144  . oxyCODONE (Oxy IR/ROXICODONE) immediate release tablet 10-15 mg  10-15 mg Oral Q4H PRN Rayburn, Neta Mends, PA-C   10 mg at 04/01/18 0244  . oxyCODONE (Oxy IR/ROXICODONE) immediate release tablet 5-10 mg  5-10 mg Oral Q4H PRN Rayburn, Neta Mends, PA-C   10 mg at 04/01/18 0849  . polyethylene glycol (MIRALAX / GLYCOLAX) packet 17 g  17  g Oral Daily PRN Rayburn, Neta Mends, PA-C      . potassium chloride SA (K-DUR,KLOR-CON) CR tablet 60 mEq  60 mEq Oral Daily Rayburn, Neta Mends, PA-C   60 mEq at 03/31/18 1011  . protein supplement (ENSURE MAX) liquid  11 oz Oral BID Rayburn, Neta Mends, PA-C   11 oz at 03/31/18 2145  . simvastatin (ZOCOR) tablet 40 mg  40 mg Oral QHS Rayburn, Neta Mends, PA-C   40 mg at 03/31/18 2145  . spironolactone (ALDACTONE) tablet 25 mg  25 mg Oral Daily Rayburn, Neta Mends, PA-C   25 mg at 03/31/18 1010     Discharge Medications: Please see discharge summary for a list of discharge medications.  Relevant Imaging Results:  Relevant Lab Results:   Additional Information SS#: [id number]  Candie Chroman, LCSW

## 2018-04-01 NOTE — Anesthesia Procedure Notes (Signed)
Procedure Name: LMA Insertion Date/Time: 04/01/2018 11:42 AM Performed by: Genelle Bal, CRNA Pre-anesthesia Checklist: Patient identified, Emergency Drugs available, Suction available and Patient being monitored Patient Re-evaluated:Patient Re-evaluated prior to induction Oxygen Delivery Method: Circle system utilized Preoxygenation: Pre-oxygenation with 100% oxygen Induction Type: IV induction Ventilation: Mask ventilation without difficulty LMA: LMA inserted LMA Size: 4.0 Number of attempts: 1 Airway Equipment and Method: Bite block Placement Confirmation: positive ETCO2 Tube secured with: Tape Dental Injury: Teeth and Oropharynx as per pre-operative assessment

## 2018-04-01 NOTE — Clinical Social Work Note (Signed)
Clinical Social Work Assessment  Patient Details  Name: April [name] MRN: [id number] Date of Birth: [birth year]  Date of referral:  04/01/18               Reason for consult:  Discharge Planning                Permission sought to share information with:  Case Manager, Facility Sport and exercise psychologist, Family Supports Permission granted to share information::  Yes, Verbal Permission Granted  Name::     Deisy Ozbun   Agency::  SNFs  Relationship::  spouse  Contact Information:   [phone]  Housing/Transportation Living arrangements for the past 2 months:  Single Family Home Source of Information:  Patient Patient Interpreter Needed:  None Criminal Activity/Legal Involvement Pertinent to Current Situation/Hospitalization:  No - Comment as needed Significant Relationships:  Spouse Lives with:  Self Do you feel safe going back to the place where you live?  No Need for family participation in patient care:  No (Coment)  Care giving concerns:  CSW received referral for possible SNF placement at time of discharge. Spoke with patient regarding possibility of SNF placement . Patient's  Spouse  is currently unable to care for her at their home given patient's current needs and fall risk.  Patient expressed understanding of PT recommendation and are agreeable to SNF placement at time of discharge. CSW to continue to follow and assist with discharge planning needs.     Social Worker assessment / plan:  Spoke with patient concerning possibility of rehab at SNF before returning home.    Employment status:  Retired Nurse, adult PT Recommendations:  [place] / Referral to community resources:  [place]  Patient/Family's Response to care:  Patient recognizes need for rehab before returning home and are agreeable to a SNF in [place]. They report preference for [facility]    . CSW explained insurance authorization process.  Patient's family reported that they want patient to get stronger to be able to come back home.    Patient/Family's Understanding of and Emotional Response to Diagnosis, Current Treatment, and Prognosis:  Patient/family is realistic regarding therapy needs and expressed being hopeful for SNF placement. Patient expressed understanding of CSW role and discharge process as well as medical condition. No questions/concerns about plan or treatment.    Emotional Assessment Appearance:  Appears stated age Attitude/Demeanor/Rapport:  Gracious Affect (typically observed):  Accepting Orientation:  Oriented to Self, Oriented to Place, Oriented to  Time, Oriented to Situation Alcohol / Substance use:  Not Applicable Psych involvement (Current and /or in the community):  No (Comment)  Discharge Needs  Concerns to be addressed:  Discharge Planning Concerns Readmission within the last 30 days:  No Current discharge risk:  Dependent with Mobility Barriers to Discharge:  Continued Medical Work up   [employer], LCSW 04/01/2018, 3:57 PM

## 2018-04-01 NOTE — Interval H&P Note (Signed)
History and Physical Interval Note:  04/01/2018 6:49 AM  April [name]  has presented today for surgery, with the diagnosis of Ulcer Right Leg  The various methods of treatment have been discussed with the patient and family. After consideration of risks, benefits and other options for treatment, the patient has consented to  Procedure(s): REPEAT IRRIGATION AND DEBRIDEMENT RIGHT LEG, APPLY SPLIT THICKNESS SKIN GRAFT (Right) as a surgical intervention .  The patient's history has been reviewed, patient examined, no change in status, stable for surgery.  I have reviewed the patient's chart and labs.  Questions were answered to the patient's satisfaction.     [name]

## 2018-04-01 NOTE — Transfer of Care (Signed)
Immediate Anesthesia Transfer of Care Note  Patient: April [name]  Procedure(s) Performed: REPEAT IRRIGATION AND DEBRIDEMENT RIGHT LEG, APPLY SPLIT THICKNESS SKIN GRAFT (Right )  Patient Location: PACU  Anesthesia Type:General  Level of Consciousness: awake, alert  and oriented  Airway & Oxygen Therapy: Patient Spontanous Breathing and Patient connected to face mask oxygen  Post-op Assessment: Report given to RN and Post -op Vital signs reviewed and stable  Post vital signs: Reviewed and stable  Last Vitals:  Vitals Value Taken Time  BP 146/70 04/01/2018 12:11 PM  Temp    Pulse 68 04/01/2018 12:13 PM  Resp 10 04/01/2018 12:13 PM  SpO2 95 % 04/01/2018 12:13 PM  Vitals shown include unvalidated device data.  Last Pain:  Vitals:   04/01/18 1106  TempSrc:   PainSc: 10-Worst pain ever      Patients Stated Pain Goal: 1 (70/65/82 6088)  Complications: No apparent anesthesia complications

## 2018-04-01 NOTE — Op Note (Signed)
04/01/2018  12:09 PM  PATIENT:  April [name]    PRE-OPERATIVE DIAGNOSIS:  Ulcer Right Leg  POST-OPERATIVE DIAGNOSIS:  Same  PROCEDURE:  REPEAT IRRIGATION AND DEBRIDEMENT RIGHT LEG, preparation of wound bed for split-thickness skin graft. APPLY SPLIT THICKNESS SKIN GRAFT 620 cm Application cleanse choice wound VAC  SURGEON:  [name], MD  PHYSICIAN ASSISTANT:None ANESTHESIA:   General  PREOPERATIVE INDICATIONS:  [name] is a  68 y.o. female with a diagnosis of Ulcer Right Leg who failed conservative measures and elected for surgical management.    The risks benefits and alternatives were discussed with the patient preoperatively including but not limited to the risks of infection, bleeding, nerve injury, cardiopulmonary complications, the need for revision surgery, among others, and the patient was willing to proceed.  OPERATIVE IMPLANTS: Allograft split-thickness skin graft 125 cm  @ENCIMAGES @  OPERATIVE FINDINGS: Good wound bed with granulation tissue  OPERATIVE PROCEDURE: Patient was brought the operating room and underwent a general anesthetic.  After adequate levels anesthesia were obtained patient's right lower extremity was prepped using Betadine paint and draped into a sterile field a timeout was called.  A rondure knife and and Cobb elevator were used to excise skin and soft tissue muscle fascia.  There was good bleeding granulation tissue after debridement this was then irrigated with normal saline electrocautery was used for hemostasis.  125 cm of allograft split-thickness skin graft was applied secured with staples.  This was then covered with the reticulated cleanse choice sponge followed by the solid cleanse choice sponge this was covered with [place] this had a good suction fit patient was extubated taken the PACU in stable condition   DISCHARGE PLANNING:  Antibiotic duration: Continue antibiotics for 23 hours  Weightbearing: Weightbearing as tolerated  bilateral lower extremities  Pain medication: Opioid pathway  Dressing care/ Wound VAC: Wound VAC without irrigation  Ambulatory devices: Walker  Discharge to: Home in 24 hours when safe with therapy  Follow-up: In the office 1 week post operative.

## 2018-04-01 NOTE — Anesthesia Preprocedure Evaluation (Addendum)
Anesthesia Evaluation  Patient identified by MRN, date of birth, ID band Patient awake    Reviewed: Allergy & Precautions, NPO status , Patient's Chart, lab work & pertinent test results  Airway Mallampati: II   Neck ROM: full    Dental   Pulmonary shortness of breath, asthma , sleep apnea ,    breath sounds clear to auscultation       Cardiovascular hypertension, +CHF   Rhythm:regular Rate:Normal     Neuro/Psych  Neuromuscular disease    GI/Hepatic PUD, GERD  ,  Endo/Other  diabetes, Type 2Morbid obesity  Renal/GU      Musculoskeletal  (+) Arthritis ,   Abdominal   Peds  Hematology   Anesthesia Other Findings   Reproductive/Obstetrics                            Anesthesia Physical Anesthesia Plan  ASA: III  Anesthesia Plan: General   Post-op Pain Management:    Induction: Intravenous  PONV Risk Score and Plan: 3 and Ondansetron, Dexamethasone and Treatment may vary due to age or medical condition  Airway Management Planned: LMA  Additional Equipment:   Intra-op Plan:   Post-operative Plan:   Informed Consent: I have reviewed the patients History and Physical, chart, labs and discussed the procedure including the risks, benefits and alternatives for the proposed anesthesia with the patient or authorized representative who has indicated his/her understanding and acceptance.       Plan Discussed with: CRNA, Anesthesiologist and Surgeon  Anesthesia Plan Comments:         Anesthesia Quick Evaluation

## 2018-04-02 LAB — GLUCOSE, CAPILLARY
Glucose-Capillary: 95 mg/dL (ref 70–99)
Glucose-Capillary: 115 mg/dL — ABNORMAL HIGH (ref 70–99)
Glucose-Capillary: 117 mg/dL — ABNORMAL HIGH (ref 70–99)
Glucose-Capillary: 125 mg/dL — ABNORMAL HIGH (ref 70–99)

## 2018-04-02 MED ORDER — POTASSIUM CHLORIDE 20 MEQ/15ML (10%) PO SOLN
60.0000 meq | Freq: Every day | ORAL | Status: DC
  Administered 2018-04-03 – 2018-04-05 (×3): 60 meq via ORAL
  Filled 2018-04-02 (×4): qty 45

## 2018-04-02 NOTE — Plan of Care (Signed)

## 2018-04-02 NOTE — Progress Notes (Signed)
Bed changed with an air mattress bed. Skin assessment done, no skin  breakdown noted, sacral foam dressing applied.

## 2018-04-02 NOTE — Progress Notes (Signed)
Subjective: 1 Day Post-Op Procedure(s) (LRB): REPEAT IRRIGATION AND DEBRIDEMENT RIGHT LEG, APPLY SPLIT THICKNESS SKIN GRAFT (Right) Patient reports pain as moderate.   C/O feeling cold all the time. Will recheck CBC in am. Trouble swallowing KCL, but aware she needs with daily Lasix   Objective: Vital signs in last 24 hours: Temp:  [97.2 F (36.2 C)-100 F (37.8 C)] 98.7 F (37.1 C) (02/29 0043) Pulse Rate:  [63-100] 71 (02/29 0455) Resp:  [11-20] 20 (02/28 1418) BP: (103-146)/(49-78) 126/66 (02/29 0455) SpO2:  [93 %-100 %] 99 % (02/29 0455)  Intake/Output from previous day: 02/28 0701 - 02/29 0700 In: 300 [I.V.:300] Out: 2110 [Urine:2100; Blood:10] Intake/Output this shift: No intake/output data recorded.  Recent Labs    03/30/18 1235  HGB 9.8*   Recent Labs    03/30/18 1235  WBC 4.8  RBC 3.47*  HCT 33.9*  PLT 237   Recent Labs    03/30/18 1235  NA 139  K 3.7  CL 102  CO2 28  BUN 13  CREATININE 1.22*  GLUCOSE 131*  CALCIUM 8.6*   No results for input(s): LABPT, INR in the last 72 hours.  VAC in place and functioning well over the right lower leg graft, VAC canister with ~50 cc of serosanguinous drainage.   Assessment/Plan: 1 Day Post-Op Procedure(s) (LRB): REPEAT IRRIGATION AND DEBRIDEMENT RIGHT LEG, APPLY SPLIT THICKNESS SKIN GRAFT (Right) Up with therapy.  Plan back to SNF next Monday.  Recheck CBC and BMET in am.  Continue VAC dressing and plan [facility] [place], PA-C 04/02/2018, 9:29 AM  [employer] [phone]

## 2018-04-03 LAB — BASIC METABOLIC PANEL
ANION GAP: 9 (ref 5–15)
BUN: 13 mg/dL (ref 8–23)
CO2: 33 mmol/L — ABNORMAL HIGH (ref 22–32)
Calcium: 8.4 mg/dL — ABNORMAL LOW (ref 8.9–10.3)
Chloride: 97 mmol/L — ABNORMAL LOW (ref 98–111)
Creatinine, Ser: 1.36 mg/dL — ABNORMAL HIGH (ref 0.44–1.00)
GFR calc Af Amer: 47 mL/min — ABNORMAL LOW (ref >60)
GFR calc non Af Amer: 40 mL/min — ABNORMAL LOW (ref >60)
Glucose, Bld: 104 mg/dL — ABNORMAL HIGH (ref 70–99)
POTASSIUM: 4.1 mmol/L (ref 3.5–5.1)
Sodium: 139 mmol/L (ref 135–145)

## 2018-04-03 LAB — CBC
HCT: 31.4 % — ABNORMAL LOW (ref 36.0–46.0)
Hemoglobin: 9.2 g/dL — ABNORMAL LOW (ref 12.0–15.0)
MCH: 28.4 pg (ref 26.0–34.0)
MCHC: 29.3 g/dL — ABNORMAL LOW (ref 30.0–36.0)
MCV: 96.9 fL (ref 80.0–100.0)
NRBC: 0 % (ref 0.0–0.2)
Platelets: 182 10*3/uL (ref 150–400)
RBC: 3.24 MIL/uL — ABNORMAL LOW (ref 3.87–5.11)
RDW: 14.4 % (ref 11.5–15.5)
WBC: 5 10*3/uL (ref 4.0–10.5)

## 2018-04-03 LAB — GLUCOSE, CAPILLARY
GLUCOSE-CAPILLARY: 130 mg/dL — AB (ref 70–99)
Glucose-Capillary: 104 mg/dL — ABNORMAL HIGH (ref 70–99)
Glucose-Capillary: 167 mg/dL — ABNORMAL HIGH (ref 70–99)
Glucose-Capillary: 118 mg/dL — ABNORMAL HIGH (ref 70–99)

## 2018-04-03 MED ORDER — WHITE PETROLATUM EX OINT
TOPICAL_OINTMENT | CUTANEOUS | Status: AC
  Administered 2018-04-03: 20:00:00
  Filled 2018-04-03: qty 28.35

## 2018-04-03 NOTE — Progress Notes (Signed)
Patient ID: April [name], female   DOB: September 29, 1950, 68 y.o.   MRN: [id number] Patient is status post split-thickness skin graft to the right leg.  There is 100 cc in the wound VAC canister.  Anticipate discharge back to skilled nursing on Monday with a portable Praveena wound VAC pump.

## 2018-04-04 ENCOUNTER — Encounter (HOSPITAL_COMMUNITY): Payer: Self-pay | Admitting: Orthopedic Surgery

## 2018-04-04 LAB — GLUCOSE, CAPILLARY
GLUCOSE-CAPILLARY: 111 mg/dL — AB (ref 70–99)
Glucose-Capillary: 150 mg/dL — ABNORMAL HIGH (ref 70–99)
Glucose-Capillary: 109 mg/dL — ABNORMAL HIGH (ref 70–99)
Glucose-Capillary: 95 mg/dL (ref 70–99)

## 2018-04-04 LAB — AEROBIC/ANAEROBIC CULTURE W GRAM STAIN (SURGICAL/DEEP WOUND)

## 2018-04-04 LAB — AEROBIC/ANAEROBIC CULTURE (SURGICAL/DEEP WOUND)

## 2018-04-04 MED ORDER — SIMVASTATIN 20 MG PO TABS
20.0000 mg | ORAL_TABLET | Freq: Every day | ORAL | Status: DC
  Administered 2018-04-04: 20 mg via ORAL
  Filled 2018-04-04: qty 1

## 2018-04-04 MED ORDER — CIPROFLOXACIN HCL 500 MG PO TABS
500.0000 mg | ORAL_TABLET | Freq: Two times a day (BID) | ORAL | Status: DC
  Administered 2018-04-04 – 2018-04-05 (×2): 500 mg via ORAL
  Filled 2018-04-04 (×2): qty 1

## 2018-04-04 MED ORDER — OXYCODONE-ACETAMINOPHEN 5-325 MG PO TABS: 1.0000 | ORAL_TABLET | ORAL | 0 refills | Status: DC | PRN

## 2018-04-04 NOTE — Plan of Care (Signed)

## 2018-04-04 NOTE — Progress Notes (Signed)
Physical Therapy Treatment Patient Details Name: April [name] MRN: [id number] DOB: 1950-11-03 Today's Date: 04/04/2018    History of Present Illness 68 year old woman who is seen for postoperative follow-up following irrigation and debridement of her right lower leg ulcer with placement of a split thickness skin graft, allograft on 03/04/2018.  She is at [facility].  Her graft has fully incorporated over the past week but she is had increased foul-smelling drainage. s/p R LE wound I&D 03/30/18 Follow up surgery scheduled 04/01/18    PT Comments    Pt is eager to get out of bed today and is making good progress towards her goals. However, pt is limited in safe mobility by pain, generalized weakness and decreased endurance. Pt requires modA for bed mobility and modAx2 for sit>stand from bed with RW. Pt unable to perform pivot so bed removed from behind pt and recliner placed so pt could sit in it. Pt reports how much she did not like the air bed. Provided education on weight shifting to reduce risk of pressure injury. Pt taught weight shifting in [place] and able to perform minimally with use of hand rails. D/c plans remain appropriate so pt can return to being able to pivot to her wheelchair.      Follow Up Recommendations  SNF;Supervision/Assistance - 24 hour     Equipment Recommendations  None recommended by PT    Recommendations for Other Services       Precautions / Restrictions Precautions Precautions: Fall Precaution Comments: Wound vac in place  Restrictions Weight Bearing Restrictions: Yes RLE Weight Bearing: Weight bearing as tolerated    Mobility  Bed Mobility Overal bed mobility: Needs Assistance Bed Mobility: Supine to Sit;Sit to Supine     Supine to sit: HOB elevated;Mod assist     General bed mobility comments: modA for management of bilateral LE to floor and hips to EoB, pt able to utilize bedrail to pull to EoB and to bring  trunk into upright  Transfers Overall transfer level: Needs assistance Equipment used: Rolling walker (2 wheeled)   Sit to Stand: Mod assist;From elevated surface;+2 physical assistance         General transfer comment: modAx2 for power up to RW, vc for hand placement, posterior pelvic tilt and upright posture. Pt currently does not have LE/UE strength to perfom pivot transfer        Balance Overall balance assessment: Needs assistance Sitting-balance support: Bilateral upper extremity supported;Feet supported;Single extremity supported Sitting balance-Leahy Scale: Poor Sitting balance - Comments: requires UE assist to maintain balance able to tolerate sitting EoB for 5 min    Standing balance support: Bilateral upper extremity supported Standing balance-Leahy Scale: Poor Standing balance comment: requires external support for standing                            Cognition Arousal/Alertness: Awake/alert Behavior During Therapy: WFL for tasks assessed/performed Overall Cognitive Status: Within Functional Limits for tasks assessed                                           General Comments General comments (skin integrity, edema, etc.): VSS      Pertinent Vitals/Pain Pain Assessment: Faces Faces Pain Scale: Hurts little more Pain Location: R LE Pain Descriptors / Indicators: Aching;Constant;Grimacing;Guarding;Sore;Throbbing Pain Intervention(s): Limited activity within  patient's tolerance;Monitored during session;Repositioned           PT Goals (current goals can now be found in the care plan section) Acute Rehab PT Goals Patient Stated Goal: have less pain PT Goal Formulation: With patient Time For Goal Achievement: 04/14/18 Potential to Achieve Goals: Fair Progress towards PT goals: Progressing toward goals    Frequency    Min 3X/week      PT Plan Current plan remains appropriate       AM-PAC PT "6 Clicks" Mobility    Outcome Measure  Help needed turning from your back to your side while in a flat bed without using bedrails?: A Lot Help needed moving from lying on your back to sitting on the side of a flat bed without using bedrails?: A Lot Help needed moving to and from a bed to a chair (including a wheelchair)?: Total Help needed standing up from a chair using your arms (e.g., wheelchair or bedside chair)?: Total Help needed to walk in hospital room?: Total Help needed climbing 3-5 steps with a railing? : Total 6 Click Score: 8    End of Session Equipment Utilized During Treatment: Gait belt Activity Tolerance: Patient limited by pain Patient left: with call bell/phone within reach;in chair Nurse Communication: Mobility status;Other (comment);Precautions(how to return to bed) PT Visit Diagnosis: Muscle weakness (generalized) (M62.81);Difficulty in walking, not elsewhere classified (R26.2);Pain Pain - Right/Left: Right Pain - part of body: Leg     Time: 0867-6195 PT Time Calculation (min) (ACUTE ONLY): 29 min  Charges:  $Gait Training: 8-22 mins $Therapeutic Activity: 8-22 mins                     Izacc Demeyer B. Migdalia Dk PT, DPT Acute Rehabilitation Services Pager [phone] Office [phone]    [place] 04/04/2018, 12:44 PM

## 2018-04-04 NOTE — Care Management Important Message (Signed)
Important Message  Patient Details  Name: April [name] MRN: [id number] Date of Birth: [birth year]   Medicare Important Message Given:  Yes    [name] Montine Circle 04/04/2018, 4:34 PM

## 2018-04-04 NOTE — Progress Notes (Signed)
Patient ID: April [name], female   DOB: September 02, 1950, 68 y.o.   MRN: [id number] Patient is stable without complaints.  There is 150 cc in the wound VAC canister.  Anticipate discharge to skilled nursing today with the portable Praveena wound VAC pump.

## 2018-04-04 NOTE — Progress Notes (Signed)
Pt complaining to this RN that she is not comfortable on her air mattress bed and requesting to put her back on a regular bed where she can turn, adjust and get her self up. This RN and NT explained to the patient the purpose and benefits of getting an appropriate bed for her body size so to prevent the development of any pressure ulcer and also explained the risks of putting her back on a regular bed. Pt verbalized ''I'd rather be on that other bed where I am comfortable enough than this bed making me feel weak." Pt was transferred to the regular bed with 5 staffs assisting. No pressure injury observed on pt's back, sacral foam in placed for prevention. Also, this RN ordered a bariatric chair just in case PT will get her up sometime today. Pt being appreciative and happy after transferring her to a regular bed and resting comfortably as of now.

## 2018-04-04 NOTE — Discharge Summary (Addendum)
Discharge Diagnoses:  Active Problems:   Abscess of right lower leg   Surgeries: Procedure(s): REPEAT IRRIGATION AND DEBRIDEMENT RIGHT LEG, APPLY SPLIT THICKNESS SKIN GRAFT on 04/01/2018    Consultants:   Discharged Condition: Improved  Hospital Course: IRYS NIGH is an 68 y.o. female who was admitted 03/30/2018 with a chief complaint of ulcer right leg, with a final diagnosis of Ulcer Right Leg.  Patient was brought to the operating room on 04/01/2018 and underwent Procedure(s): REPEAT IRRIGATION AND DEBRIDEMENT RIGHT LEG, APPLY SPLIT THICKNESS SKIN GRAFT.    Patient was given perioperative antibiotics:  Anti-infectives (From admission, onward)   Start     Dose/Rate Route Frequency Ordered Stop   04/05/18 0000  ciprofloxacin (CIPRO) 500 MG tablet     500 mg Oral 2 times daily 04/05/18 1325     04/04/18 1600  ciprofloxacin (CIPRO) tablet 500 mg     500 mg Oral 2 times daily 04/04/18 1407     04/01/18 1130  clindamycin (CLEOCIN) IVPB 900 mg     900 mg 100 mL/hr over 30 Minutes Intravenous To [facility]Stay [facility] 03/31/18 1332 04/01/18 1215   03/31/18 0600  clindamycin (CLEOCIN) IVPB 900 mg     900 mg 100 mL/hr over 30 Minutes Intravenous On call to O.R. 03/30/18 1215 03/30/18 1630   03/30/18 2200  clindamycin (CLEOCIN) IVPB 600 mg     600 mg 100 mL/hr over 30 Minutes Intravenous Every 6 hours 03/30/18 1725 03/31/18 1109    .  Patient was given sequential compression devices, early ambulation, and aspirin for DVT prophylaxis.  Recent vital signs:  Patient Vitals for the past 24 hrs:  BP Temp Temp src Pulse Resp SpO2  04/05/18 1235 (!) 104/47 98.6 F (37 C) Oral 70 - 95 %  04/05/18 0848 - - - 73 20 92 %  04/05/18 0603 99/83 98 F (36.7 C) Oral 71 18 95 %  04/04/18 2141 (!) 124/54 98.9 F (37.2 C) Oral 73 18 94 %  04/04/18 2039 - - - - - 92 %  04/04/18 1732 136/64 98.5 F (36.9 C) Oral 71 - (!) 87 %  .  Recent laboratory studies: No results found.  Discharge  Medications:   Allergies as of 04/05/2018      Reactions   Penicillins Rash, Other (See Comments)   She was told not to take it anymore. Has patient had a PCN reaction causing immediate rash, facial/tongue/throat swelling, SOB or lightheadedness with hypotension: Yes Has patient had a PCN reaction causing severe rash involving mucus membranes or skin necrosis: No Has patient had a PCN reaction that required hospitalization: No Has patient had a PCN reaction occurring within the last 10 years: No If all of the above answers are "NO", then may proceed with Cephalosporin use.'   Shellfish Allergy Anaphylaxis   Sulfonamide Derivatives Anaphylaxis, Other (See Comments)   REACTION: internal "burning"   Dipyridamole Other (See Comments)   Unknown reaction   Estrogens Other (See Comments)   Unknown reaction   Hydrochlorothiazide Other (See Comments)   Unknown reaction   Hydrochlorothiazide W-triamterene Other (See Comments)   Hypokalemia   Lotensin [benazepril Hcl] Other (See Comments)   Unknown reaction   Metronidazole Other (See Comments)   Unknown reaction   Mustard [allyl Isothiocyanate] Itching   Spironolactone Other (See Comments)   "kidney problems"   Sulfa Antibiotics Other (See Comments)   Unknown reaction   Torsemide Other (See Comments)   Unknown reaction   Valsartan Other (  See Comments)   Unknown reaction      Medication List    STOP taking these medications   doxycycline 100 MG capsule Commonly known as:  VIBRAMYCIN   HYDROcodone-acetaminophen 5-325 MG tablet Commonly known as:  NORCO/VICODIN   hydrOXYzine 10 MG tablet Commonly known as:  ATARAX/VISTARIL     TAKE these medications   acetaminophen 325 MG tablet Commonly known as:  TYLENOL Take 650 mg by mouth every 6 (six) hours as needed (pain).   albuterol 108 (90 Base) MCG/ACT inhaler Commonly known as:  PROVENTIL HFA Inhale 2 puffs into the lungs every 4 (four) hours as needed for wheezing or shortness of  breath.   albuterol (2.5 MG/3ML) 0.083% nebulizer solution Commonly known as:  PROVENTIL Take 3 mLs (2.5 mg total) by nebulization every 6 (six) hours as needed for wheezing or shortness of breath.   budesonide-formoterol 160-4.5 MCG/ACT inhaler Commonly known as:  SYMBICORT one - two inhalations every 12 hours; gargle and spit after use What changed:    how much to take  how to take this  when to take this  additional instructions   carvedilol 25 MG tablet Commonly known as:  COREG TAKE 1 TABLET BY MOUTH TWICE DAILY WITH A MEAL What changed:  See the new instructions.   ciprofloxacin 500 MG tablet Commonly known as:  CIPRO Take 1 tablet (500 mg total) by mouth 2 (two) times daily.   cyanocobalamin 1000 MCG/ML injection Commonly known as:  (VITAMIN B-12) INJECT 1ML INTO MUSCLE EVERY 30 DAYS What changed:    how much to take  how to take this  when to take this  additional instructions   diclofenac sodium 1 % Gel Commonly known as:  VOLTAREN Apply 2 g topically 4 (four) times daily.   docusate sodium 100 MG capsule Commonly known as:  COLACE Take 1 capsule (100 mg total) by mouth 2 (two) times daily.   ECOTRIN LOW STRENGTH 81 MG EC tablet Generic drug:  aspirin Take 81 mg by mouth at bedtime.   famotidine 20 MG tablet Commonly known as:  PEPCID Take 1 tablet (20 mg total) by mouth 2 (two) times daily.   feeding supplement (PRO-STAT SUGAR FREE 64) Liqd Take 30 mLs by mouth 3 (three) times daily.   Fish Oil 1000 MG Caps Take 1,000 mg by mouth daily.   Flax Seed Oil 1000 MG Caps Take 1,000 mg by mouth 2 (two) times daily.   fluocinonide cream 0.05 % Commonly known as:  LIDEX Apply 1 application topically every 8 (eight) hours as needed (itching).   folic acid 229 MCG tablet Commonly known as:  FOLVITE Take 800 mcg by mouth at bedtime.   furosemide 40 MG tablet Commonly known as:  LASIX Take 3 tablets (120 mg total) by mouth 2 (two) times  daily.   multivitamin with minerals Tabs tablet Take 1 tablet by mouth daily.   NEEDLE (DISP) 23 G 23G X 1-1/2" Misc Commonly known as:  EASY TOUCH FLIPLOCK NEEDLES Use to inject B12 once monthly.   nystatin cream Commonly known as:  MYCOSTATIN Apply 1 application topically 2 (two) times daily. What changed:    when to take this  additional instructions   onetouch ultrasoft lancets Use to help check blood sugars twice a day Dx E11.9   OXcarbazepine 150 MG tablet Commonly known as:  TRILEPTAL Take 1 tablet (150 mg total) by mouth 2 (two) times daily.   oxyCODONE-acetaminophen 5-325 MG tablet Commonly known as:  PERCOCET/ROXICET Take 1 tablet by mouth every 4 (four) hours as needed. What changed:    when to take this  reasons to take this  Another medication with the same name was removed. Continue taking this medication, and follow the directions you see here.   polyethylene glycol packet Commonly known as:  MIRALAX / GLYCOLAX Take 17 g by mouth daily as needed for mild constipation.   potassium chloride SA 20 MEQ tablet Commonly known as:  K-DUR,KLOR-CON TAKE 3 TABLETS BY MOUTH DAILY.   simvastatin 40 MG tablet Commonly known as:  ZOCOR TAKE 1 TABLET BY MOUTH AT BEDTIME   spironolactone 25 MG tablet Commonly known as:  ALDACTONE TAKE 1 TABLET BY MOUTH ONCE DAILY   SYRINGE-NEEDLE (DISP) 3 ML 23G X 1" 3 ML Misc Use with B12 to inject IM every 30 days   triamcinolone cream 0.1 % Commonly known as:  KENALOG Apply 1 application topically See admin instructions. Apply topically to back of knees every evening for rash       Diagnostic Studies: Dg Chest Port 1 View  Result Date: 03/08/2018 CLINICAL DATA:  Fevers EXAM: PORTABLE CHEST 1 VIEW COMPARISON:  02/15/2010 FINDINGS: Cardiac shadow is mildly enlarged. Aortic calcifications are again seen. Lungs are well aerated bilaterally. Minimal bibasilar atelectasis is seen. No pneumothorax or effusion is noted. No  bony abnormality is seen. IMPRESSION: Minimal bibasilar atelectasis. Electronically Signed   By: Inez Catalina M.D.   On: 03/08/2018 08:46    Patient benefited maximally from their hospital stay and there were no complications.     Disposition: Discharge disposition: 03-Skilled Nursing Facility      Discharge Instructions    Call MD / Call 911   Complete by:  As directed    If you experience chest pain or shortness of breath, CALL 911 and be transported to the hospital emergency room.  If you develope a fever above 101 F, pus (white drainage) or increased drainage or redness at the wound, or calf pain, call your surgeon's office.   Call MD / Call 911   Complete by:  As directed    If you experience chest pain or shortness of breath, CALL 911 and be transported to the hospital emergency room.  If you develope a fever above 101 F, pus (white drainage) or increased drainage or redness at the wound, or calf pain, call your surgeon's office.   Call MD / Call 911   Complete by:  As directed    If you experience chest pain or shortness of breath, CALL 911 and be transported to the hospital emergency room.  If you develope a fever above 101 F, pus (white drainage) or increased drainage or redness at the wound, or calf pain, call your surgeon's office.   Constipation Prevention   Complete by:  As directed    Drink plenty of fluids.  Prune juice may be helpful.  You may use a stool softener, such as Colace (over the counter) 100 mg twice a day.  Use MiraLax (over the counter) for constipation as needed.   Constipation Prevention   Complete by:  As directed    Drink plenty of fluids.  Prune juice may be helpful.  You may use a stool softener, such as Colace (over the counter) 100 mg twice a day.  Use MiraLax (over the counter) for constipation as needed.   Constipation Prevention   Complete by:  As directed    Drink plenty of fluids.  Prune juice  may be helpful.  You may use a stool softener, such  as Colace (over the counter) 100 mg twice a day.  Use MiraLax (over the counter) for constipation as needed.   Diet - low sodium heart healthy   Complete by:  As directed    Diet - low sodium heart healthy   Complete by:  As directed    Diet - low sodium heart healthy   Complete by:  As directed    Increase activity slowly as tolerated   Complete by:  As directed    Increase activity slowly as tolerated   Complete by:  As directed    Increase activity slowly as tolerated   Complete by:  As directed    Negative Pressure Wound Therapy - Incisional   Complete by:  As directed    Attached wound VAC dressing to the portable Praveena wound VAC pump for discharge      Contact information for follow-up providers    [name], MD In 1 week.   Specialty:  Orthopedic Surgery Contact information: [place]  [phone]            Contact information for after-discharge care    Destination    [facility] C[facility] Preferred SNF .   Service:  Skilled Nursing Contact information: [address] [place] [phone]                   Signed: [name] 04/05/2018, 1:25 PM

## 2018-04-05 LAB — GLUCOSE, CAPILLARY
Glucose-Capillary: 125 mg/dL — ABNORMAL HIGH (ref 70–99)
Glucose-Capillary: 136 mg/dL — ABNORMAL HIGH (ref 70–99)

## 2018-04-05 MED ORDER — CIPROFLOXACIN HCL 500 MG PO TABS: 500.0000 mg | ORAL_TABLET | Freq: Two times a day (BID) | ORAL | 0 refills | Status: DC

## 2018-04-05 NOTE — Clinical Social Work Placement (Signed)
   CLINICAL SOCIAL WORK PLACEMENT  NOTE  Date:  04/05/2018  Patient Details  Name: April [name] MRN: [id number] Date of Birth: Aug 03, 1950  Clinical Social Work is seeking post-discharge placement for this patient at the [place] level of care (*CSW will initial, date and re-position this form in  chart as items are completed):      Patient/family provided with [place] Work Department's list of facilities offering this level of care within the geographic area requested by the patient (or if unable, by the patient's family).  Yes   Patient/family informed of their freedom to choose among providers that offer the needed level of care, that participate in Medicare, Medicaid or managed care program needed by the patient, have an available bed and are willing to accept the patient.  Yes   Patient/family informed of [facility]'s ownership interest in [facility] and [facility], as well as of the fact that they are under no obligation to receive care at these facilities.  PASRR submitted to EDS on       PASRR number received on 04/01/18     Existing PASRR number confirmed on       FL2 transmitted to all facilities in geographic area requested by pt/family on 04/01/18     FL2 transmitted to all facilities within larger geographic area on       Patient informed that his/her managed care company has contracts with or will negotiate with certain facilities, including the following:        Yes   Patient/family informed of bed offers received.  Patient chooses bed at [facility]     Physician recommends and patient chooses bed at      Patient to be transferred to [facility] on 04/05/18.  Patient to be transferred to facility by [facility]     Patient family notified on 04/05/18 of transfer.  Name of family member notified:  patient will contact     PHYSICIAN       Additional Comment:     _______________________________________________ Alberteen Sam, LCSW 04/05/2018, 12:58 PM

## 2018-04-05 NOTE — Progress Notes (Signed)
RN called [facility] and gave report to [address], [place]. Pt notified of discharge, IV has been removed and Praveena [facility] placed on pt.

## 2018-04-05 NOTE — Progress Notes (Signed)
Patient ID: April [name], female   DOB: [birth year], 68 y.o.   MRN: [id number] Plan for discharge to skilled nursing.  No change in the discharge summary from summary completed yesterday.  Change hospital VAC to the portable Praveena wound VAC prior to discharge.

## 2018-04-05 NOTE — Progress Notes (Signed)
PT belongings have been packed and Praveena portable [facility] has placed and is working, Games developer and booklet have been packed up for the patient in a belongings bag. PT iv has been removed, Report has been called to miranda at [facility]. Pt eating lunch is not in any pain at the moment, awaiting [facility] for transport.

## 2018-04-05 NOTE — Progress Notes (Signed)
Patient will DC JN:[id number] Anticipated DC date:04/05/2018 Family notified:patient will notfiy Transport VL:[id number]  Per MD patient ready for DC to [facility] health care . RN, patient, patient's family, and facility notified of DC. Discharge Summary sent to facility. RN given number for report [phone]. DC packet on chart. Ambulance transport requested for patient.  CSW signing off.  [place], [place]

## 2018-04-06 ENCOUNTER — Telehealth: Payer: Self-pay | Admitting: *Deleted

## 2018-04-06 NOTE — Telephone Encounter (Signed)
Pt was on TCM report was admitted 03/30/2018 with a chief complaint of ulcer right leg, with a final diagnosis of Ulcer Right Leg.  Patient underwent Procedure(s): REPEAT IRRIGATION AND DEBRIDEMENT RIGHT LEG, APPLY SPLIT THICKNESS SKIN GRAFT on 04/01/18. Patient benefited maximally from their hospital stay and there were no complications. Pt D/C 04/05/18 to [facility]. Pt will follow w/surgeon once she has been release from SNF.Marland KitchenJohny Chess

## 2018-04-11 ENCOUNTER — Ambulatory Visit (INDEPENDENT_AMBULATORY_CARE_PROVIDER_SITE_OTHER): Payer: Medicare Other | Admitting: Physician Assistant

## 2018-04-11 ENCOUNTER — Encounter (INDEPENDENT_AMBULATORY_CARE_PROVIDER_SITE_OTHER): Payer: Self-pay | Admitting: Orthopedic Surgery

## 2018-04-11 VITALS — Ht 64.0 in | Wt 360.0 lb

## 2018-04-11 DIAGNOSIS — Z6841 Body Mass Index (BMI) 40.0 and over, adult: Secondary | ICD-10-CM

## 2018-04-11 DIAGNOSIS — Z945 Skin transplant status: Secondary | ICD-10-CM

## 2018-04-11 NOTE — Progress Notes (Signed)
Office Visit Note   Patient: April [name]           Date of Birth: 1950-11-17           MRN: [id number] Visit Date: 04/11/2018              Requested by: [name], MD [place], [place] [address] PCP: [name], MD  Chief Complaint  Patient presents with  . Right Leg - Routine Post Op    04/01/2018 right LE I&D STSG      HPI: The patient is a 68 year old female here for postoperative follow-up following repeat debridement of her right lower leg wound with application of a another split thickness skin graft, allograft and VAC placement on 04/01/2018. She is a little more than 1 week postop. She discharged back to [facility] care.  She has had the [facility] in place since her surgery. Assessment & Plan: Visit Diagnoses:  1. Body mass index 60.0-69.9, adult ([place])     Plan: The VAC was removed and she has an excellent granulation bed present.  Orders were sent for Adaptic dressing to the graft site and they can leave in place and only change this once a week.  They should apply new 4 x 4 gauze and ABD pads Kerlix and an Ace wrap above the Adaptic daily.  Orders were also sent for daily washing of her feet with soap and water and applying Aquaphor or similar for moisture.  She is to keep her legs elevated as much as possible.  Orders were sent for a wheelchair with elevating leg rest.  She will follow-up in 1 week.  Follow-Up Instructions: Return in about 1 week (around 04/18/2018).   Ortho Exam  Patient is alert, oriented, no adenopathy, well-dressed, normal affect, normal respiratory effort. The [facility] was removed and the patient has an excellent beefy red granulation bed present which bleeds readily following removal of the cleanse twice VAC sponge.  There is no sign of cellulitis or infection.  Bleeding was controlled with direct pressure.  She has palpable pedal pulses.  Her edema is improved overall with compression but she does not tolerate compression  dressings well and we will continue Ace wrapping.  Imaging: No results found. No images are attached to the encounter.  Labs: Lab Results  Component Value Date   HGBA1C 6.5 12/03/2017   HGBA1C 6.2 06/29/2017   HGBA1C 5.7 12/11/2016   ESRSEDRATE 56 (H) 03/09/2018   ESRSEDRATE 13 06/02/2016   ESRSEDRATE 24 (H) 01/23/2011   CRP 6.3 (H) 03/09/2018   CRP 2.2 06/02/2016   REPTSTATUS 04/04/2018 FINAL 03/30/2018   GRAMSTAIN  03/30/2018    FEW WBC PRESENT, PREDOMINANTLY PMN RARE GRAM POSITIVE RODS Performed at [facility], [place] [address], [place],  [address]    CULT  03/30/2018    MODERATE DIPHTHEROIDS(CORYNEBACTERIUM SPECIES) Standardized susceptibility testing for this organism is not available. RARE PROTEUS MIRABILIS RARE ENTEROCOCCUS FAECALIS NO ANAEROBES ISOLATED    LABORGA PROTEUS MIRABILIS 03/30/2018   LABORGA ENTEROCOCCUS FAECALIS 03/30/2018     Lab Results  Component Value Date   ALBUMIN 2.2 (L) 03/08/2018   ALBUMIN 4.0 12/14/2017   ALBUMIN 3.8 12/03/2017    Body mass index is 61.79 kg/m.  Orders:  No orders of the defined types were placed in this encounter.  No orders of the defined types were placed in this encounter.    Procedures: No procedures performed  Clinical Data: No  additional findings.  ROS:  All other systems negative, except as noted in the HPI. Review of Systems  Objective: Vital Signs: Ht 5\' 4"  (1.626 m)   Wt (!) 360 lb (163.3 kg)   BMI 61.79 kg/m   Specialty Comments:  No specialty comments available.  PMFS History: Patient Active Problem List   Diagnosis Date Noted  . Traumatic wound dehiscence   . Wound of right leg 02/18/2018  . Blister 02/18/2018  . Cellulitis of right leg 01/14/2018  . Skin lumps 01/14/2018  . Bilateral leg cramps 12/14/2017  . Dry skin dermatitis 06/29/2017  . Left shoulder pain 08/14/2016  . Meralgia paresthetica 07/16/2016  . Chronic left-sided low back pain with left-sided  sciatica 06/02/2016  . Venous stasis ulcer ([place]) 05/13/2016  . Abscess of right lower leg 03/28/2016  . Whole body pain 03/20/2016  . Diabetic peripheral neuropathy ([place]) 03/20/2016  . Osteopenia 01/11/2016  . CKD (chronic kidney disease) stage 3, GFR 30-59 ml/min (HCC) 01/02/2016  . Hand paresthesia 07/18/2015  . Carpal tunnel syndrome 06/07/2015  . Family history of colon cancer   . Diabetes mellitus with neurological manifestations ([place]) 04/18/2015  . Bilateral leg edema 04/11/2015  . Cellulitis of leg, right 04/11/2015  . Abnormality of gait 01/03/2015  . Hereditary and idiopathic peripheral neuropathy 01/03/2015  . GERD (gastroesophageal reflux disease) 06/17/2014  . Morbid obesity with BMI of 50.0-59.9, adult ([place]) 09/05/2013  . Hx of colonic polyps 12/15/2012  . Vitamin B12 deficiency 11/03/2012  . Intrinsic asthma 03/23/2012  . Chronic diastolic heart failure ([place]) 02/20/2011  . OSA (obstructive sleep apnea) 09/17/2010  . URINARY URGENCY 01/08/2010  . CAD, NATIVE VESSEL 11/20/2008  . OSTEOARTHRITIS, KNEES, BILATERAL, SEVERE 06/14/2008  . Hyperlipidemia 05/10/2007  . Essential hypertension 01/18/2007  . HYPOKALEMIA 04/30/2006  . Body mass index 60.0-69.9, adult ([place]) 04/30/2006  . HX, PERSONAL, PEPTIC ULCER DISEASE 04/30/2006   Past Medical History:  Diagnosis Date  . Anemia   . Asthma   . CAD (coronary artery disease)   . Carpal tunnel syndrome   . Cellulitis of both lower extremities 04/11/2015  . CHF (congestive heart failure) ([place])   . Colon polyp, hyperplastic 2007 & 2012  . Complication of anesthesia 1999   svt with renal calculi surgery, no problems since  . CTS (carpal tunnel syndrome)   . Diabetes mellitus   . Eczema   . GERD (gastroesophageal reflux disease)   . Hyperlipidemia   . Hypertension   . Leg ulcer ([place]) 04/24/2015   Right lateral leg No evidence of an infection Monitor closely Keep edema controlled   . Leg ulcer ([place])    right lower  .  Meralgia paresthetica    Dr. [name]  . Morbid obesity ([place])   . Morbid obesity ([place])   . Neuropathy    toes and legs  . Osteoarthrosis, unspecified whether generalized or localized, lower leg   . PUD (peptic ulcer disease)   . Shortness of breath dyspnea    with exertion  . Sleep apnea    per progress note 02/25/2018  . Type II or unspecified type diabetes mellitus without mention of complication, not stated as uncontrolled   . Unspecified hereditary and idiopathic peripheral neuropathy   . Vitamin B12 deficiency   . Wears glasses   . Wound cellulitis    right upper leg, healing well    Family History  Problem Relation Age of Onset  . Colon cancer Mother   . Prostate cancer Father   .  Colon cancer Father   . Diabetes Maternal Aunt   . Breast cancer Maternal Aunt   . Diabetes Maternal Uncle   . Diabetes Paternal Aunt   . Stroke Paternal Aunt        > 65  . Heart disease Paternal Aunt   . Diabetes Paternal Uncle   . Breast cancer Maternal Aunt         X 2  . Breast cancer Cousin     Past Surgical History:  Procedure Laterality Date  . ABDOMINAL HYSTERECTOMY    . CARDIAC CATHETERIZATION  2002   non obstructive disease  . colonoscopy with polypectomy  2007 & 2012    hyperplastic ;Dr Watt Climes  . COLONOSCOPY WITH PROPOFOL N/A 06/04/2015   Procedure: COLONOSCOPY WITH PROPOFOL;  Surgeon: Jerene Bears, MD;  Location: [facility];  Service: Gastroenterology;  Laterality: N/A;  . DEBRIDEMENT LEG Right 03/02/2018   WOUND VAC APPLIED  . DILATION AND CURETTAGE OF UTERUS     multiple  . HEMORRHOID SURGERY    . I&D EXTREMITY Right 03/02/2018   Procedure: RIGHT LEG DEBRIDEMENT AND PLACE VAC;  Surgeon: [name], MD;  Location: [facility];  Service: Orthopedics;  Laterality: Right;  . I&D EXTREMITY Right 03/04/2018   Procedure: REPEAT IRRIGATION AND DEBRIDEMENT RIGHT LEG, PLACE WOUND VAC;  Surgeon: [name], MD;  Location: [facility];  Service: Orthopedics;  Laterality: Right;  . I&D  EXTREMITY Right 03/30/2018   Procedure: IRRIGATION AND DEBRIDEMENT RIGHT LEG, APPLY WOUND VAC;  Surgeon: [name], MD;  Location: [place];  Service: Orthopedics;  Laterality: Right;  . renal calculi  12/1997   SVT with induction of anesthesia  . right knee arthroscopy    . SKIN SPLIT GRAFT Right 03/04/2018   Procedure: POSSIBLE SPLIT THICKNESS SKIN GRAFT;  Surgeon: [name], MD;  Location: [facility];  Service: Orthopedics;  Laterality: Right;  . SKIN SPLIT GRAFT Right 04/01/2018   Procedure: REPEAT IRRIGATION AND DEBRIDEMENT RIGHT LEG, APPLY SPLIT THICKNESS SKIN GRAFT;  Surgeon: [name], MD;  Location: [facility];  Service: Orthopedics;  Laterality: Right;  . TONSILLECTOMY AND ADENOIDECTOMY     Social History   Occupational History  . Occupation: Disabled    Employer: RETIRED  Tobacco Use  . Smoking status: Never Smoker  . Smokeless tobacco: Never Used  Substance and Sexual Activity  . Alcohol use: No    Alcohol/week: 0.0 standard drinks  . Drug use: No  . Sexual activity: Not on file

## 2018-04-12 ENCOUNTER — Encounter (INDEPENDENT_AMBULATORY_CARE_PROVIDER_SITE_OTHER): Payer: Self-pay | Admitting: Physician Assistant

## 2018-04-12 ENCOUNTER — Inpatient Hospital Stay (INDEPENDENT_AMBULATORY_CARE_PROVIDER_SITE_OTHER): Payer: Medicare Other | Admitting: Physician Assistant

## 2018-04-12 NOTE — Anesthesia Postprocedure Evaluation (Signed)
Anesthesia Post Note  Patient: Surveyor, mining  Procedure(s) Performed: REPEAT IRRIGATION AND DEBRIDEMENT RIGHT LEG, APPLY SPLIT THICKNESS SKIN GRAFT (Right )     Patient location during evaluation: PACU Anesthesia Type: General Level of consciousness: awake and alert Pain management: pain level controlled Vital Signs Assessment: post-procedure vital signs reviewed and stable Respiratory status: spontaneous breathing, nonlabored ventilation, respiratory function stable and patient connected to nasal cannula oxygen Cardiovascular status: blood pressure returned to baseline and stable Postop Assessment: no apparent nausea or vomiting Anesthetic complications: no    Last Vitals:  Vitals:   04/05/18 0848 04/05/18 1235  BP:  (!) 104/47  Pulse: 73 70  Resp: 20   Temp:  37 C  SpO2: 92% 95%    Last Pain:  Vitals:   04/05/18 1235  TempSrc: Oral  PainSc:                  [place]

## 2018-04-19 ENCOUNTER — Other Ambulatory Visit: Payer: Self-pay

## 2018-04-19 ENCOUNTER — Ambulatory Visit (INDEPENDENT_AMBULATORY_CARE_PROVIDER_SITE_OTHER): Payer: Medicare Other | Admitting: Orthopedic Surgery

## 2018-04-19 ENCOUNTER — Encounter (INDEPENDENT_AMBULATORY_CARE_PROVIDER_SITE_OTHER): Payer: Self-pay | Admitting: Orthopedic Surgery

## 2018-04-19 VITALS — Ht 64.0 in | Wt 360.0 lb

## 2018-04-19 DIAGNOSIS — L97919 Non-pressure chronic ulcer of unspecified part of right lower leg with unspecified severity: Secondary | ICD-10-CM

## 2018-04-19 DIAGNOSIS — I87331 Chronic venous hypertension (idiopathic) with ulcer and inflammation of right lower extremity: Secondary | ICD-10-CM

## 2018-04-19 NOTE — Progress Notes (Signed)
Office Visit Note   Patient: April [name]           Date of Birth: Jun 08, 1950           MRN: [id number] Visit Date: 04/19/2018              Requested by: [name], MD [place], [place] [address] PCP: [name], MD  Chief Complaint  Patient presents with  . Right Leg - Routine Post Op      HPI: Patient is a 68 year old woman who presents in follow-up for split-thickness skin graft large traumatic venous wound right leg.  Patient states she has been having some sharp pain.  She denies any odor.  Assessment & Plan: Visit Diagnoses:  1. Idiopathic chronic venous hypertension of right lower extremity with ulcer and inflammation ([place])     Plan: Patient is showing good healthy wound bed with stable treatment with her current care.  We will have her continue with the hydrocolloid ointment dressings Ace wrap she is having this treated at home twice a week.  We will follow-up in the office in 2 weeks.  Follow-Up Instructions: Return in about 2 weeks (around 05/03/2018).   Ortho Exam  Patient is alert, oriented, no adenopathy, well-dressed, normal affect, normal respiratory effort. Examination patient has healthy granulation tissue in the wound bed and this measures 17 x 5.5 cm.  We will harvest the staples today.  We will apply silver cell plus 4 x 4's plus an Ace wrap.  Imaging: No results found.   Labs: Lab Results  Component Value Date   HGBA1C 6.5 12/03/2017   HGBA1C 6.2 06/29/2017   HGBA1C 5.7 12/11/2016   ESRSEDRATE 56 (H) 03/09/2018   ESRSEDRATE 13 06/02/2016   ESRSEDRATE 24 (H) 01/23/2011   CRP 6.3 (H) 03/09/2018   CRP 2.2 06/02/2016   REPTSTATUS 04/04/2018 FINAL 03/30/2018   GRAMSTAIN  03/30/2018    FEW WBC PRESENT, PREDOMINANTLY PMN RARE GRAM POSITIVE RODS Performed at [facility], [place] [address], [place],  [address]    CULT  03/30/2018    MODERATE DIPHTHEROIDS(CORYNEBACTERIUM SPECIES) Standardized susceptibility testing  for this organism is not available. RARE PROTEUS MIRABILIS RARE ENTEROCOCCUS FAECALIS NO ANAEROBES ISOLATED    LABORGA PROTEUS MIRABILIS 03/30/2018   LABORGA ENTEROCOCCUS FAECALIS 03/30/2018     Lab Results  Component Value Date   ALBUMIN 2.2 (L) 03/08/2018   ALBUMIN 4.0 12/14/2017   ALBUMIN 3.8 12/03/2017    Body mass index is 61.79 kg/m.  Orders:  No orders of the defined types were placed in this encounter.  No orders of the defined types were placed in this encounter.    Procedures: No procedures performed  Clinical Data: No additional findings.  ROS:  All other systems negative, except as noted in the HPI. Review of Systems  Objective: Vital Signs: Ht 5\' 4"  (1.626 m)   Wt (!) 360 lb (163.3 kg)   BMI 61.79 kg/m   Specialty Comments:  No specialty comments available.  PMFS History: Patient Active Problem List   Diagnosis Date Noted  . Traumatic wound dehiscence   . Wound of right leg 02/18/2018  . Blister 02/18/2018  . Cellulitis of right leg 01/14/2018  . Skin lumps 01/14/2018  . Bilateral leg cramps 12/14/2017  . Dry skin dermatitis 06/29/2017  . Left shoulder pain 08/14/2016  . Meralgia paresthetica 07/16/2016  . Chronic left-sided low back pain with left-sided sciatica 06/02/2016  . Venous stasis  ulcer ([place]) 05/13/2016  . Abscess of right lower leg 03/28/2016  . Whole body pain 03/20/2016  . Diabetic peripheral neuropathy ([place]) 03/20/2016  . Osteopenia 01/11/2016  . CKD (chronic kidney disease) stage 3, GFR 30-59 ml/min (HCC) 01/02/2016  . Hand paresthesia 07/18/2015  . Carpal tunnel syndrome 06/07/2015  . Family history of colon cancer   . Diabetes mellitus with neurological manifestations ([place]) 04/18/2015  . Bilateral leg edema 04/11/2015  . Cellulitis of leg, right 04/11/2015  . Abnormality of gait 01/03/2015  . Hereditary and idiopathic peripheral neuropathy 01/03/2015  . GERD (gastroesophageal reflux disease) 06/17/2014  . Morbid  obesity with BMI of 50.0-59.9, adult ([place]) 09/05/2013  . Hx of colonic polyps 12/15/2012  . Vitamin B12 deficiency 11/03/2012  . Intrinsic asthma 03/23/2012  . Chronic diastolic heart failure ([place]) 02/20/2011  . OSA (obstructive sleep apnea) 09/17/2010  . URINARY URGENCY 01/08/2010  . CAD, NATIVE VESSEL 11/20/2008  . OSTEOARTHRITIS, KNEES, BILATERAL, SEVERE 06/14/2008  . Hyperlipidemia 05/10/2007  . Essential hypertension 01/18/2007  . HYPOKALEMIA 04/30/2006  . Body mass index 60.0-69.9, adult ([place]) 04/30/2006  . HX, PERSONAL, PEPTIC ULCER DISEASE 04/30/2006   Past Medical History:  Diagnosis Date  . Anemia   . Asthma   . CAD (coronary artery disease)   . Carpal tunnel syndrome   . Cellulitis of both lower extremities 04/11/2015  . CHF (congestive heart failure) ([place])   . Colon polyp, hyperplastic 2007 & 2012  . Complication of anesthesia 1999   svt with renal calculi surgery, no problems since  . CTS (carpal tunnel syndrome)   . Diabetes mellitus   . Eczema   . GERD (gastroesophageal reflux disease)   . Hyperlipidemia   . Hypertension   . Leg ulcer ([place]) 04/24/2015   Right lateral leg No evidence of an infection Monitor closely Keep edema controlled   . Leg ulcer ([place] [address])    right lower  . Meralgia paresthetica    Dr. [name]  . Morbid obesity ([place])   . Morbid obesity ([place])   . Neuropathy    toes and legs  . Osteoarthrosis, unspecified whether generalized or localized, lower leg   . PUD (peptic ulcer disease)   . Shortness of breath dyspnea    with exertion  . Sleep apnea    per progress note 02/25/2018  . Type II or unspecified type diabetes mellitus without mention of complication, not stated as uncontrolled   . Unspecified hereditary and idiopathic peripheral neuropathy   . Vitamin B12 deficiency   . Wears glasses   . Wound cellulitis    right upper leg, healing well    Family History  Problem Relation Age of Onset  . Colon cancer Mother   . Prostate cancer Father    . Colon cancer Father   . Diabetes Maternal Aunt   . Breast cancer Maternal Aunt   . Diabetes Maternal Uncle   . Diabetes Paternal Aunt   . Stroke Paternal Aunt        > 65  . Heart disease Paternal Aunt   . Diabetes Paternal Uncle   . Breast cancer Maternal Aunt         X 2  . Breast cancer Cousin     Past Surgical History:  Procedure Laterality Date  . ABDOMINAL HYSTERECTOMY    . CARDIAC CATHETERIZATION  2002   non obstructive disease  . colonoscopy with polypectomy  2007 & 2012    hyperplastic ;Dr Watt Climes  . COLONOSCOPY WITH PROPOFOL N/A  06/04/2015   Procedure: COLONOSCOPY WITH PROPOFOL;  Surgeon: Jerene Bears, MD;  Location: [facility];  Service: Gastroenterology;  Laterality: N/A;  . DEBRIDEMENT LEG Right 03/02/2018   WOUND VAC APPLIED  . DILATION AND CURETTAGE OF UTERUS     multiple  . HEMORRHOID SURGERY    . I&D EXTREMITY Right 03/02/2018   Procedure: RIGHT LEG DEBRIDEMENT AND PLACE VAC;  Surgeon: [name], MD;  Location: [facility];  Service: Orthopedics;  Laterality: Right;  . I&D EXTREMITY Right 03/04/2018   Procedure: REPEAT IRRIGATION AND DEBRIDEMENT RIGHT LEG, PLACE WOUND VAC;  Surgeon: [name], MD;  Location: [facility];  Service: Orthopedics;  Laterality: Right;  . I&D EXTREMITY Right 03/30/2018   Procedure: IRRIGATION AND DEBRIDEMENT RIGHT LEG, APPLY WOUND VAC;  Surgeon: [name], MD;  Location: [facility];  Service: Orthopedics;  Laterality: Right;  . renal calculi  12/1997   SVT with induction of anesthesia  . right knee arthroscopy    . SKIN SPLIT GRAFT Right 03/04/2018   Procedure: POSSIBLE SPLIT THICKNESS SKIN GRAFT;  Surgeon: [name], MD;  Location: [facility];  Service: Orthopedics;  Laterality: Right;  . SKIN SPLIT GRAFT Right 04/01/2018   Procedure: REPEAT IRRIGATION AND DEBRIDEMENT RIGHT LEG, APPLY SPLIT THICKNESS SKIN GRAFT;  Surgeon: [name], MD;  Location: [facility];  Service: Orthopedics;  Laterality: Right;  . TONSILLECTOMY AND ADENOIDECTOMY      Social History   Occupational History  . Occupation: Disabled    Employer: RETIRED  Tobacco Use  . Smoking status: Never Smoker  . Smokeless tobacco: Never Used  Substance and Sexual Activity  . Alcohol use: No    Alcohol/week: 0.0 standard drinks  . Drug use: No  . Sexual activity: Not on file

## 2018-04-23 ENCOUNTER — Other Ambulatory Visit: Payer: Self-pay | Admitting: Internal Medicine

## 2018-04-23 DIAGNOSIS — E785 Hyperlipidemia, unspecified: Secondary | ICD-10-CM

## 2018-04-27 ENCOUNTER — Ambulatory Visit (INDEPENDENT_AMBULATORY_CARE_PROVIDER_SITE_OTHER): Payer: Medicare Other | Admitting: Orthopedic Surgery

## 2018-04-28 ENCOUNTER — Ambulatory Visit: Payer: Medicare Other

## 2018-04-29 ENCOUNTER — Ambulatory Visit: Payer: Medicare Other | Admitting: Podiatry

## 2018-04-29 ENCOUNTER — Telehealth (INDEPENDENT_AMBULATORY_CARE_PROVIDER_SITE_OTHER): Payer: Medicare Other

## 2018-04-29 DIAGNOSIS — S81801S Unspecified open wound, right lower leg, sequela: Secondary | ICD-10-CM

## 2018-04-29 DIAGNOSIS — E538 Deficiency of other specified B group vitamins: Secondary | ICD-10-CM | POA: Diagnosis not present

## 2018-04-29 DIAGNOSIS — R600 Localized edema: Secondary | ICD-10-CM

## 2018-04-29 NOTE — Telephone Encounter (Signed)
Spoke with pt today and she was asking what she needed to do with her appointment on 4/3. It is for a rehab follow up and she really wanted to follow up with you since there has been a lot going on. She does not have access to do the webex meeting nor facetime on her phone. How do you want this to be handled?

## 2018-04-30 NOTE — Telephone Encounter (Signed)
We can do a telephone visit - but I would need at least 30 minutes - 45 minutes for her.

## 2018-05-02 ENCOUNTER — Telehealth (INDEPENDENT_AMBULATORY_CARE_PROVIDER_SITE_OTHER): Payer: Self-pay | Admitting: *Deleted

## 2018-05-02 NOTE — Telephone Encounter (Signed)
LVM letting pt know.  

## 2018-05-02 NOTE — Telephone Encounter (Signed)
Pt is aware of phone call on 4/3 @ 2:00. Pt also asked is she was supposed to still be taking spironolactone while taking the lasix and potassium pills.

## 2018-05-02 NOTE — Telephone Encounter (Signed)
Yes, at this point she should continue everything.

## 2018-05-02 NOTE — Telephone Encounter (Signed)
Called pt and they answered no to all covid-19 pre screening questions.  

## 2018-05-03 ENCOUNTER — Other Ambulatory Visit: Payer: Self-pay

## 2018-05-03 ENCOUNTER — Encounter (INDEPENDENT_AMBULATORY_CARE_PROVIDER_SITE_OTHER): Payer: Self-pay | Admitting: Orthopedic Surgery

## 2018-05-03 ENCOUNTER — Ambulatory Visit (INDEPENDENT_AMBULATORY_CARE_PROVIDER_SITE_OTHER): Payer: Medicare Other | Admitting: Orthopedic Surgery

## 2018-05-03 ENCOUNTER — Ambulatory Visit: Payer: Medicare Other | Admitting: Orthotics

## 2018-05-03 VITALS — Ht 64.0 in | Wt 360.0 lb

## 2018-05-03 DIAGNOSIS — E43 Unspecified severe protein-calorie malnutrition: Secondary | ICD-10-CM

## 2018-05-03 DIAGNOSIS — I87331 Chronic venous hypertension (idiopathic) with ulcer and inflammation of right lower extremity: Secondary | ICD-10-CM

## 2018-05-03 DIAGNOSIS — Z6841 Body Mass Index (BMI) 40.0 and over, adult: Secondary | ICD-10-CM

## 2018-05-03 DIAGNOSIS — L97919 Non-pressure chronic ulcer of unspecified part of right lower leg with unspecified severity: Secondary | ICD-10-CM

## 2018-05-03 NOTE — Progress Notes (Signed)
Office Visit Note   Patient: FATINA SPRANKLE           Date of Birth: 03-04-1950           MRN: [id number] Visit Date: 05/03/2018              Requested by: [name], MD [place], [place] [address] PCP: [name], MD  Chief Complaint  Patient presents with  . Right Leg - Routine Post Op    04/01/18 Repeat I&D STSG      HPI: Patient is a 68 year old woman who presents in follow-up status post skin graft to the right lower extremity for a large traumatic wound.  Patient is recently left skilled nursing facility is currently at home with home health dressing changes 3 times a week.  Assessment & Plan: Visit Diagnoses:  1. Idiopathic chronic venous hypertension of right lower extremity with ulcer and inflammation (HCC)   2. Body mass index 60.0-69.9, adult ([place])   3. Severe protein-calorie malnutrition ([place])     Plan: Patient was given a prescription for home health nursing to apply the dressing and compression wrap and extend this up to her knee for both lower extremities the wraps are currently only going halfway up her leg.  Also discussed the importance of elevating her legs so there is no pressure on her heels.  Patient is at high risk for developing decubitus heel ulcers.  Follow-Up Instructions: Return in about 2 weeks (around 05/17/2018).   Ortho Exam  Patient is alert, oriented, no adenopathy, well-dressed, normal affect, normal respiratory effort. Examination the wound on the right lower extremity is healing nicely currently using Adaptic ABDs and an Ace wrap with home health nursing.  There is 100% granulation tissue in the wound bed there is no epiboly around the wound edges there is no cellulitis no odor no drainage.  The wound is 15 cm in width and the length is at maximum 6 cm in the mid aspect of the wound.  Her calfs measure 70 cm in circumference.  There is no heel ulcers.  Imaging: No results found. No images are attached to the encounter.   Labs: Lab Results  Component Value Date   HGBA1C 6.5 12/03/2017   HGBA1C 6.2 06/29/2017   HGBA1C 5.7 12/11/2016   ESRSEDRATE 56 (H) 03/09/2018   ESRSEDRATE 13 06/02/2016   ESRSEDRATE 24 (H) 01/23/2011   CRP 6.3 (H) 03/09/2018   CRP 2.2 06/02/2016   REPTSTATUS 04/04/2018 FINAL 03/30/2018   GRAMSTAIN  03/30/2018    FEW WBC PRESENT, PREDOMINANTLY PMN RARE GRAM POSITIVE RODS Performed at [facility], [place] [address] [place], [place] [address]    CULT  03/30/2018    MODERATE DIPHTHEROIDS(CORYNEBACTERIUM SPECIES) Standardized susceptibility testing for this organism is not available. RARE PROTEUS MIRABILIS RARE ENTEROCOCCUS FAECALIS NO ANAEROBES ISOLATED    LABORGA PROTEUS MIRABILIS 03/30/2018   LABORGA ENTEROCOCCUS FAECALIS 03/30/2018     Lab Results  Component Value Date   ALBUMIN 2.2 (L) 03/08/2018   ALBUMIN 4.0 12/14/2017   ALBUMIN 3.8 12/03/2017    Body mass index is 61.79 kg/m.  Orders:  No orders of the defined types were placed in this encounter.  No orders of the defined types were placed in this encounter.    Procedures: No procedures performed  Clinical Data: No additional findings.  ROS:  All other systems negative, except as noted in the HPI. Review of Systems  Objective: Vital Signs: Ht  5\' 4"  (1.626 m)   Wt (!) 360 lb (163.3 kg)   BMI 61.79 kg/m   Specialty Comments:  No specialty comments available.  PMFS History: Patient Active Problem List   Diagnosis Date Noted  . Traumatic wound dehiscence   . Wound of right leg 02/18/2018  . Blister 02/18/2018  . Cellulitis of right leg 01/14/2018  . Skin lumps 01/14/2018  . Bilateral leg cramps 12/14/2017  . Dry skin dermatitis 06/29/2017  . Left shoulder pain 08/14/2016  . Meralgia paresthetica 07/16/2016  . Chronic left-sided low back pain with left-sided sciatica 06/02/2016  . Venous stasis ulcer ([place]) 05/13/2016  . Abscess of right lower leg 03/28/2016  . Whole body pain  03/20/2016  . Diabetic peripheral neuropathy ([place]) 03/20/2016  . Osteopenia 01/11/2016  . CKD (chronic kidney disease) stage 3, GFR 30-59 ml/min (HCC) 01/02/2016  . Hand paresthesia 07/18/2015  . Carpal tunnel syndrome 06/07/2015  . Family history of colon cancer   . Diabetes mellitus with neurological manifestations ([place]) 04/18/2015  . Bilateral leg edema 04/11/2015  . Cellulitis of leg, right 04/11/2015  . Abnormality of gait 01/03/2015  . Hereditary and idiopathic peripheral neuropathy 01/03/2015  . GERD (gastroesophageal reflux disease) 06/17/2014  . Morbid obesity with BMI of 50.0-59.9, adult ([place]) 09/05/2013  . Hx of colonic polyps 12/15/2012  . Vitamin B12 deficiency 11/03/2012  . Intrinsic asthma 03/23/2012  . Chronic diastolic heart failure ([place]) 02/20/2011  . OSA (obstructive sleep apnea) 09/17/2010  . URINARY URGENCY 01/08/2010  . CAD, NATIVE VESSEL 11/20/2008  . OSTEOARTHRITIS, KNEES, BILATERAL, SEVERE 06/14/2008  . Hyperlipidemia 05/10/2007  . Essential hypertension 01/18/2007  . HYPOKALEMIA 04/30/2006  . Body mass index 60.0-69.9, adult ([place]) 04/30/2006  . HX, PERSONAL, PEPTIC ULCER DISEASE 04/30/2006   Past Medical History:  Diagnosis Date  . Anemia   . Asthma   . CAD (coronary artery disease)   . Carpal tunnel syndrome   . Cellulitis of both lower extremities 04/11/2015  . CHF (congestive heart failure) ([place])   . Colon polyp, hyperplastic 2007 & 2012  . Complication of anesthesia 1999   svt with renal calculi surgery, no problems since  . CTS (carpal tunnel syndrome)   . Diabetes mellitus   . Eczema   . GERD (gastroesophageal reflux disease)   . Hyperlipidemia   . Hypertension   . Leg ulcer ([place]) 04/24/2015   Right lateral leg No evidence of an infection Monitor closely Keep edema controlled   . Leg ulcer ([place])    right lower  . Meralgia paresthetica    Dr. [name]  . Morbid obesity ([place])   . Morbid obesity ([place])   . Neuropathy    toes and legs  .  Osteoarthrosis, unspecified whether generalized or localized, lower leg   . PUD (peptic ulcer disease)   . Shortness of breath dyspnea    with exertion  . Sleep apnea    per progress note 02/25/2018  . Type II or unspecified type diabetes mellitus without mention of complication, not stated as uncontrolled   . Unspecified hereditary and idiopathic peripheral neuropathy   . Vitamin B12 deficiency   . Wears glasses   . Wound cellulitis    right upper leg, healing well    Family History  Problem Relation Age of Onset  . Colon cancer Mother   . Prostate cancer Father   . Colon cancer Father   . Diabetes Maternal Aunt   . Breast cancer Maternal Aunt   . Diabetes Maternal  Uncle   . Diabetes Paternal Aunt   . Stroke Paternal Aunt        > 65  . Heart disease Paternal Aunt   . Diabetes Paternal Uncle   . Breast cancer Maternal Aunt         X 2  . Breast cancer Cousin     Past Surgical History:  Procedure Laterality Date  . ABDOMINAL HYSTERECTOMY    . CARDIAC CATHETERIZATION  2002   non obstructive disease  . colonoscopy with polypectomy  2007 & 2012    hyperplastic ;Dr Watt Climes  . COLONOSCOPY WITH PROPOFOL N/A 06/04/2015   Procedure: COLONOSCOPY WITH PROPOFOL;  Surgeon: Jerene Bears, MD;  Location: [facility];  Service: Gastroenterology;  Laterality: N/A;  . DEBRIDEMENT LEG Right 03/02/2018   WOUND VAC APPLIED  . DILATION AND CURETTAGE OF UTERUS     multiple  . HEMORRHOID SURGERY    . I&D EXTREMITY Right 03/02/2018   Procedure: RIGHT LEG DEBRIDEMENT AND PLACE VAC;  Surgeon: [name], MD;  Location: [facility];  Service: Orthopedics;  Laterality: Right;  . I&D EXTREMITY Right 03/04/2018   Procedure: REPEAT IRRIGATION AND DEBRIDEMENT RIGHT LEG, PLACE WOUND VAC;  Surgeon: [name], MD;  Location: [facility];  Service: Orthopedics;  Laterality: Right;  . I&D EXTREMITY Right 03/30/2018   Procedure: IRRIGATION AND DEBRIDEMENT RIGHT LEG, APPLY WOUND VAC;  Surgeon: [name], MD;   Location: [facility];  Service: Orthopedics;  Laterality: Right;  . renal calculi  12/1997   SVT with induction of anesthesia  . right knee arthroscopy    . SKIN SPLIT GRAFT Right 03/04/2018   Procedure: POSSIBLE SPLIT THICKNESS SKIN GRAFT;  Surgeon: [name], MD;  Location: [facility];  Service: Orthopedics;  Laterality: Right;  . SKIN SPLIT GRAFT Right 04/01/2018   Procedure: REPEAT IRRIGATION AND DEBRIDEMENT RIGHT LEG, APPLY SPLIT THICKNESS SKIN GRAFT;  Surgeon: [name], MD;  Location: [facility];  Service: Orthopedics;  Laterality: Right;  . TONSILLECTOMY AND ADENOIDECTOMY     Social History   Occupational History  . Occupation: Disabled    Employer: RETIRED  Tobacco Use  . Smoking status: Never Smoker  . Smokeless tobacco: Never Used  Substance and Sexual Activity  . Alcohol use: No    Alcohol/week: 0.0 standard drinks  . Drug use: No  . Sexual activity: Not on file

## 2018-05-04 ENCOUNTER — Telehealth (INDEPENDENT_AMBULATORY_CARE_PROVIDER_SITE_OTHER): Payer: Self-pay | Admitting: Orthopedic Surgery

## 2018-05-04 NOTE — Telephone Encounter (Signed)
Please advise 

## 2018-05-04 NOTE — Telephone Encounter (Signed)
I called [facility] and advised.

## 2018-05-04 NOTE — Telephone Encounter (Signed)
3 layer compression please

## 2018-05-04 NOTE — Telephone Encounter (Signed)
April [name], from [facility] called wanting to clarify the dressing orders for this patient.  She needs to know if Dr. [name] wants 2 layer, 3 layer, or 4 layer compression dressings.  CB#[phone].  Thank you.

## 2018-05-05 NOTE — Telephone Encounter (Signed)
Telephone call scheduled 4/3 if she has questions with her medication and some of her chronic medical problems since leaving rehab.

## 2018-05-06 ENCOUNTER — Ambulatory Visit: Payer: Medicare Other | Admitting: Internal Medicine

## 2018-05-06 MED ORDER — CYANOCOBALAMIN 1000 MCG/ML IJ SOLN: INTRAMUSCULAR | 0 refills | Status: DC

## 2018-05-06 MED ORDER — "NEEDLE (DISP) 23G X 1-1/2"" MISC": 0 refills | Status: DC

## 2018-05-06 NOTE — Addendum Note (Signed)
Addended by: [name] on: 05/06/2018 02:21 PM   Modules accepted: Orders

## 2018-05-06 NOTE — Telephone Encounter (Signed)
Cumulative time during 7-day interval 5 minutes, there was not an associated office visit for this concern within a 7 day period.  Verbal consent for services obtained from patient prior to services given.  Names of all persons present for services: April Rail, MD, April [name]   Chief complaint: Questions regarding medications  April [name] was just recently discharged from rehab and is at home now.  Since I saw her last April [name] fell at home in January and had a laceration to the right leg.  April [name] developed an abscess that required drainage on a couple of different occasions.  April [name] ultimately required a skin graft because it was very deep.  April [name] has had a wound VAC and is following closely with Dr. [name] of [facility] orthopedics.  April [name] has home health coming into her house 3 times a week for dressing changes.  The wound is painful and April [name] still has her chronic knee pain  April [name] is taking her medication, but was concerned if April [name] should be taking both the spironolactone and the furosemide.  April [name] has been on these medications for a while and there has been no changes.  April [name] was discharged from the hospital on both.  April [name] still has some leg swelling in the upper lower legs-this is chronic.  April [name] also has B12 deficiency and would like to do injections while April [name] has at home health nurse coming to her house.  April [name] would need prescriptions sent to her pharmacy.  April [name] is having some difficulty sleeping because of knee pain and pain in the wound.  April [name] is minimally active because of the pain.   History, background, results pertinent:  Past Medical History:  Diagnosis Date  . Anemia   . Asthma   . CAD (coronary artery disease)   . Carpal tunnel syndrome   . Cellulitis of both lower extremities 04/11/2015  . CHF (congestive heart failure) ([place])   . Colon polyp, hyperplastic 2007 & 2012  . Complication of anesthesia 1999   svt with renal calculi surgery, no problems since  . CTS (carpal tunnel syndrome)   . Diabetes mellitus    . Eczema   . GERD (gastroesophageal reflux disease)   . Hyperlipidemia   . Hypertension   . Leg ulcer ([place]) 04/24/2015   Right lateral leg No evidence of an infection Monitor closely Keep edema controlled   . Leg ulcer ([place])    right lower  . Meralgia paresthetica    Dr. [name]  . Morbid obesity ([place])   . Morbid obesity ([place])   . Neuropathy    toes and legs  . Osteoarthrosis, unspecified whether generalized or localized, lower leg   . PUD (peptic ulcer disease)   . Shortness of breath dyspnea    with exertion  . Sleep apnea    per progress note 02/25/2018  . Type II or unspecified type diabetes mellitus without mention of complication, not stated as uncontrolled   . Unspecified hereditary and idiopathic peripheral neuropathy   . Vitamin B12 deficiency   . Wears glasses   . Wound cellulitis    right upper leg, healing well   @[id number] @    A/P/next steps: B12 deficiency, chronic leg swelling, right leg wound  For B12 deficiency I will send prescriptions to her pharmacy so that the home health nurse can give her monthly injections.  April [name] has been on B12 replacement for years we will continue this which is more convenient  Chronic leg swelling is at baseline.  April [name] will continue  the furosemide and spironolactone.  April [name] has been on these medications for a long time and no changes are needed.  April [name] is also having her legs wrapped by the home health nurse.  April [name] saw nephrology in December 2019 and they wanted her to continue the current medications-no changes needed  Right leg wound was a result of a laceration in January after falling at home.  Following with Dr. [name].  Has had a skin graft and repeat irrigation and debridements.  Advised her I am following along-Dr. [name] sends me his notes and I have seen pictures  April [name] denies any other questions.  April [name] will call if April [name] has any other concerns or questions.  April [name] will follow-up in the office after the coronavirus situation has resolved.

## 2018-05-10 ENCOUNTER — Telehealth (INDEPENDENT_AMBULATORY_CARE_PROVIDER_SITE_OTHER): Payer: Self-pay

## 2018-05-10 NOTE — Telephone Encounter (Signed)
Too soon for refill.  Patient needs to keep her leg elevated level with her heart and work on moving the ankle up and down to work the calf pump to decrease swelling.  She may use anti-inflammatories to help with the pain if she can take these safely.

## 2018-05-10 NOTE — Telephone Encounter (Signed)
Please advise, Pt last filled 3/32/2020 Oxy-Acet 5-325 mg  Qty # 20 for 5 days. Thanks

## 2018-05-10 NOTE — Telephone Encounter (Signed)
Pt called and is requesting a refill on oxycodone 5/325

## 2018-05-10 NOTE — Telephone Encounter (Signed)
Called patient to advise on message below. She is aware. She is scheduled for a follow up on 05/17/2018 with Dr. [name].

## 2018-05-16 ENCOUNTER — Telehealth (INDEPENDENT_AMBULATORY_CARE_PROVIDER_SITE_OTHER): Payer: Self-pay | Admitting: Radiology

## 2018-05-16 ENCOUNTER — Telehealth (INDEPENDENT_AMBULATORY_CARE_PROVIDER_SITE_OTHER): Payer: Self-pay

## 2018-05-16 NOTE — Telephone Encounter (Signed)
Changed 4/14 appt with Dr. [name] to 4/16

## 2018-05-16 NOTE — Telephone Encounter (Signed)
I spoke to patient about making an earlier appt for 05/17/18 and she will be giving our office a call back to make sure she can come in at earlier time, if so patient also needs to be pre-screened to. Her appt was scheduled at 1pm, I made a mistake and cancelled her appt for tomorrow; Juluis Rainier

## 2018-05-17 ENCOUNTER — Ambulatory Visit (INDEPENDENT_AMBULATORY_CARE_PROVIDER_SITE_OTHER): Payer: Medicare Other | Admitting: Orthopedic Surgery

## 2018-05-19 ENCOUNTER — Other Ambulatory Visit: Payer: Self-pay

## 2018-05-19 ENCOUNTER — Ambulatory Visit (INDEPENDENT_AMBULATORY_CARE_PROVIDER_SITE_OTHER): Payer: Medicare Other | Admitting: Orthopedic Surgery

## 2018-05-19 ENCOUNTER — Encounter (INDEPENDENT_AMBULATORY_CARE_PROVIDER_SITE_OTHER): Payer: Self-pay | Admitting: Orthopedic Surgery

## 2018-05-19 VITALS — Ht 64.0 in | Wt 360.0 lb

## 2018-05-19 DIAGNOSIS — L97919 Non-pressure chronic ulcer of unspecified part of right lower leg with unspecified severity: Secondary | ICD-10-CM

## 2018-05-19 DIAGNOSIS — I87331 Chronic venous hypertension (idiopathic) with ulcer and inflammation of right lower extremity: Secondary | ICD-10-CM | POA: Insufficient documentation

## 2018-05-19 DIAGNOSIS — B351 Tinea unguium: Secondary | ICD-10-CM | POA: Insufficient documentation

## 2018-05-19 MED ORDER — OXYCODONE-ACETAMINOPHEN 5-325 MG PO TABS: 1.0000 | ORAL_TABLET | ORAL | 0 refills | Status: DC | PRN

## 2018-05-19 NOTE — Progress Notes (Signed)
Office Visit Note   Patient: April [name]           Date of Birth: 1950-05-09           MRN: [id number] Visit Date: 05/19/2018              Requested by: [name], MD [place], [place] [address] PCP: [name], MD  Chief Complaint  Patient presents with  . Right Leg - Routine Post Op    04/01/2018 repeat I&D STSG       HPI: Patient is a 68 year old woman who presents follow-up for split-thickness skin graft massive venous ulcer right leg.  Patient has been having a compression wrap applied 3 times a week.  Despite multiple dressing changes patient still has increased drainage.  Patient also complains of onychomycotic nails x10 she states her podiatry appointment was canceled.  Assessment & Plan: Visit Diagnoses:  1. Idiopathic chronic venous hypertension of right lower extremity with ulcer and inflammation (HCC)     Plan: Patient was placed in a medical compression stocking for the right leg this is doubled over to give increased compression over the wound.  She will change this daily if she can have somebody change her sock if not home health nursing will change this 3 times a week she is to wash the leg with soap and water between dressing changes and she is to wash the sock.  Discussed the importance of elevation.  Patient wanted a cortisone injection in the right knee and I recommended we hold on steroid injections for the right knee until the wound has shown some improvement in healing.  Follow-Up Instructions: Return in about 1 week (around 05/26/2018).   Ortho Exam  Patient is alert, oriented, no adenopathy, well-dressed, normal affect, normal respiratory effort. On examination patient's wound has 100% fibrinous exudative tissue there is no cellulitis no odor there is clear drainage.  Patient's calf measures 60 cm in circumference.  Examination of the nails she has thickened discolored onychomycotic nails x10 patient is unable to safely trim the nails on  her own and the nails were trimmed Z56 without complications.  Imaging: No results found.    Labs: Lab Results  Component Value Date   HGBA1C 6.5 12/03/2017   HGBA1C 6.2 06/29/2017   HGBA1C 5.7 12/11/2016   ESRSEDRATE 56 (H) 03/09/2018   ESRSEDRATE 13 06/02/2016   ESRSEDRATE 24 (H) 01/23/2011   CRP 6.3 (H) 03/09/2018   CRP 2.2 06/02/2016   REPTSTATUS 04/04/2018 FINAL 03/30/2018   GRAMSTAIN  03/30/2018    FEW WBC PRESENT, PREDOMINANTLY PMN RARE GRAM POSITIVE RODS Performed at [facility], [place] [address] [place], [place] [address]    CULT  03/30/2018    MODERATE DIPHTHEROIDS(CORYNEBACTERIUM SPECIES) Standardized susceptibility testing for this organism is not available. RARE PROTEUS MIRABILIS RARE ENTEROCOCCUS FAECALIS NO ANAEROBES ISOLATED    LABORGA PROTEUS MIRABILIS 03/30/2018   LABORGA ENTEROCOCCUS FAECALIS 03/30/2018     Lab Results  Component Value Date   ALBUMIN 2.2 (L) 03/08/2018   ALBUMIN 4.0 12/14/2017   ALBUMIN 3.8 12/03/2017    Body mass index is 61.79 kg/m.  Orders:  No orders of the defined types were placed in this encounter.  Meds ordered this encounter  Medications  . DISCONTD: oxyCODONE-acetaminophen (PERCOCET/ROXICET) 5-325 MG tablet    Sig: Take 1 tablet by mouth every 4 (four) hours as needed.    Dispense:  30 tablet    Refill:  0  . oxyCODONE-acetaminophen (PERCOCET/ROXICET) 5-325 MG tablet    Sig: Take 1 tablet by mouth every 4 (four) hours as needed.    Dispense:  30 tablet    Refill:  0     Procedures: No procedures performed  Clinical Data: No additional findings.  ROS:  All other systems negative, except as noted in the HPI. Review of Systems  Objective: Vital Signs: Ht 5\' 4"  (1.626 m)   Wt (!) 360 lb (163.3 kg)   BMI 61.79 kg/m   Specialty Comments:  No specialty comments available.  PMFS History: Patient Active Problem List   Diagnosis Date Noted  . Traumatic wound dehiscence   . Wound of right leg  02/18/2018  . Blister 02/18/2018  . Cellulitis of right leg 01/14/2018  . Skin lumps 01/14/2018  . Bilateral leg cramps 12/14/2017  . Dry skin dermatitis 06/29/2017  . Left shoulder pain 08/14/2016  . Meralgia paresthetica 07/16/2016  . Chronic left-sided low back pain with left-sided sciatica 06/02/2016  . Venous stasis ulcer ([place]) 05/13/2016  . Abscess of right lower leg 03/28/2016  . Whole body pain 03/20/2016  . Diabetic peripheral neuropathy ([place]) 03/20/2016  . Osteopenia 01/11/2016  . CKD (chronic kidney disease) stage 3, GFR 30-59 ml/min (HCC) 01/02/2016  . Hand paresthesia 07/18/2015  . Carpal tunnel syndrome 06/07/2015  . Family history of colon cancer   . Diabetes mellitus with neurological manifestations ([place]) 04/18/2015  . Bilateral leg edema 04/11/2015  . Cellulitis of leg, right 04/11/2015  . Abnormality of gait 01/03/2015  . Hereditary and idiopathic peripheral neuropathy 01/03/2015  . GERD (gastroesophageal reflux disease) 06/17/2014  . Morbid obesity with BMI of 50.0-59.9, adult ([place]) 09/05/2013  . Hx of colonic polyps 12/15/2012  . Vitamin B12 deficiency 11/03/2012  . Intrinsic asthma 03/23/2012  . Chronic diastolic heart failure ([place]) 02/20/2011  . OSA (obstructive sleep apnea) 09/17/2010  . URINARY URGENCY 01/08/2010  . CAD, NATIVE VESSEL 11/20/2008  . OSTEOARTHRITIS, KNEES, BILATERAL, SEVERE 06/14/2008  . Hyperlipidemia 05/10/2007  . Essential hypertension 01/18/2007  . HYPOKALEMIA 04/30/2006  . Body mass index 60.0-69.9, adult ([place]) 04/30/2006  . HX, PERSONAL, PEPTIC ULCER DISEASE 04/30/2006   Past Medical History:  Diagnosis Date  . Anemia   . Asthma   . CAD (coronary artery disease)   . Carpal tunnel syndrome   . Cellulitis of both lower extremities 04/11/2015  . CHF (congestive heart failure) ([place])   . Colon polyp, hyperplastic 2007 & 2012  . Complication of anesthesia 1999   svt with renal calculi surgery, no problems since  . CTS (carpal  tunnel syndrome)   . Diabetes mellitus   . Eczema   . GERD (gastroesophageal reflux disease)   . Hyperlipidemia   . Hypertension   . Leg ulcer ([place]) 04/24/2015   Right lateral leg No evidence of an infection Monitor closely Keep edema controlled   . Leg ulcer ([place])    right lower  . Meralgia paresthetica    Dr. [name]  . Morbid obesity ([place])   . Morbid obesity ([place])   . Neuropathy    toes and legs  . Osteoarthrosis, unspecified whether generalized or localized, lower leg   . PUD (peptic ulcer disease)   . Shortness of breath dyspnea    with exertion  . Sleep apnea    per progress note 02/25/2018  . Type II or unspecified type diabetes mellitus without mention of complication, not stated as uncontrolled   . Unspecified hereditary and idiopathic peripheral  neuropathy   . Vitamin B12 deficiency   . Wears glasses   . Wound cellulitis    right upper leg, healing well    Family History  Problem Relation Age of Onset  . Colon cancer Mother   . Prostate cancer Father   . Colon cancer Father   . Diabetes Maternal Aunt   . Breast cancer Maternal Aunt   . Diabetes Maternal Uncle   . Diabetes Paternal Aunt   . Stroke Paternal Aunt        > 65  . Heart disease Paternal Aunt   . Diabetes Paternal Uncle   . Breast cancer Maternal Aunt         X 2  . Breast cancer Cousin     Past Surgical History:  Procedure Laterality Date  . ABDOMINAL HYSTERECTOMY    . CARDIAC CATHETERIZATION  2002   non obstructive disease  . colonoscopy with polypectomy  2007 & 2012    hyperplastic ;Dr Watt Climes  . COLONOSCOPY WITH PROPOFOL N/A 06/04/2015   Procedure: COLONOSCOPY WITH PROPOFOL;  Surgeon: Jerene Bears, MD;  [facility];  Service: Gastroenterology;  Laterality: N/A;  . DEBRIDEMENT LEG Right 03/02/2018   WOUND VAC APPLIED  . DILATION AND CURETTAGE OF UTERUS     multiple  . HEMORRHOID SURGERY    . I&D EXTREMITY Right 03/02/2018   Procedure: RIGHT LEG DEBRIDEMENT AND PLACE VAC;  Surgeon:  [name], MD;  Location: [facility];  Service: Orthopedics;  Laterality: Right;  . I&D EXTREMITY Right 03/04/2018   Procedure: REPEAT IRRIGATION AND DEBRIDEMENT RIGHT LEG, PLACE WOUND VAC;  Surgeon: [name], MD;  Location: [facility];  Service: Orthopedics;  Laterality: Right;  . I&D EXTREMITY Right 03/30/2018   Procedure: IRRIGATION AND DEBRIDEMENT RIGHT LEG, APPLY WOUND VAC;  Surgeon: [name], MD;  Location: [facility];  Service: Orthopedics;  Laterality: Right;  . renal calculi  12/1997   SVT with induction of anesthesia  . right knee arthroscopy    . SKIN SPLIT GRAFT Right 03/04/2018   Procedure: POSSIBLE SPLIT THICKNESS SKIN GRAFT;  Surgeon: [name], MD;  Location: [facility];  Service: Orthopedics;  Laterality: Right;  . SKIN SPLIT GRAFT Right 04/01/2018   Procedure: REPEAT IRRIGATION AND DEBRIDEMENT RIGHT LEG, APPLY SPLIT THICKNESS SKIN GRAFT;  Surgeon: [name], MD;  Location: [facility];  Service: Orthopedics;  Laterality: Right;  . TONSILLECTOMY AND ADENOIDECTOMY     Social History   Occupational History  . Occupation: Disabled    Employer: RETIRED  Tobacco Use  . Smoking status: Never Smoker  . Smokeless tobacco: Never Used  Substance and Sexual Activity  . Alcohol use: No    Alcohol/week: 0.0 standard drinks  . Drug use: No  . Sexual activity: Not on file

## 2018-05-23 ENCOUNTER — Other Ambulatory Visit: Payer: Self-pay

## 2018-05-23 ENCOUNTER — Ambulatory Visit (INDEPENDENT_AMBULATORY_CARE_PROVIDER_SITE_OTHER): Payer: Medicare Other | Admitting: Physician Assistant

## 2018-05-23 ENCOUNTER — Encounter (INDEPENDENT_AMBULATORY_CARE_PROVIDER_SITE_OTHER): Payer: Self-pay | Admitting: Orthopedic Surgery

## 2018-05-23 VITALS — Ht 64.0 in | Wt 360.0 lb

## 2018-05-23 DIAGNOSIS — L97919 Non-pressure chronic ulcer of unspecified part of right lower leg with unspecified severity: Secondary | ICD-10-CM

## 2018-05-23 DIAGNOSIS — I5032 Chronic diastolic (congestive) heart failure: Secondary | ICD-10-CM

## 2018-05-23 DIAGNOSIS — N183 Chronic kidney disease, stage 3 unspecified: Secondary | ICD-10-CM

## 2018-05-23 DIAGNOSIS — I87331 Chronic venous hypertension (idiopathic) with ulcer and inflammation of right lower extremity: Secondary | ICD-10-CM

## 2018-05-23 DIAGNOSIS — E1142 Type 2 diabetes mellitus with diabetic polyneuropathy: Secondary | ICD-10-CM

## 2018-05-23 DIAGNOSIS — E43 Unspecified severe protein-calorie malnutrition: Secondary | ICD-10-CM

## 2018-05-23 NOTE — Progress Notes (Signed)
Office Visit Note   Patient: April [name]           Date of Birth: Jan 03, 1951           MRN: [id number] Visit Date: 05/23/2018              Requested by: [name], MD [place], [place] [id number] PCP: [name], MD  Chief Complaint  Patient presents with  . Right Leg - Routine Post Op    2/28/20repeat I&D right leg SG      HPI: The patient is a 68 year old woman who presents for follow-up of her split-thickness skin grafting of very large venous ulcer of the right lower extremity.  She was placed in a medical compression stocking but reports that the stockings kept rolling down and she had the nurse apply a multilayer compression wrap but these have also slid down over the past week.  She has had a lot of increased drainage from the residual ulcer and there is evidence of biofilm over the area today.  There is also odor over the area.  Assessment & Plan: Visit Diagnoses:  1. Idiopathic chronic venous hypertension of right lower extremity with ulcer and inflammation (HCC)   2. Severe protein-calorie malnutrition ([place])   3. Chronic diastolic heart failure ([place])   4. Diabetic peripheral neuropathy ([place])   5. CKD (chronic kidney disease) stage 3, GFR 30-59 ml/min (HCC)     Plan: Will plan to return to the OR for repeat debridement of the right lower leg ulceration with surgical preparation and placement of skin graft or Integra later this week.  Organ to apply silver cell dressing over the wound bed today in a Dynaflex multilayer compression wrap and this can be changed on Wednesday by home health nursing with silver cell to the wound bed and then multilayer compression wrap above.  She should continue to try to wear the medical compression stocking to the left lower extremity.  She will follow-up following surgery.  Follow-Up Instructions: Return in about 11 days (around 06/03/2018).   Ortho Exam  Patient is alert, oriented, no adenopathy, well-dressed, normal  affect, normal respiratory effort. The right lower extremity ulcer continues to gradually heal in.  There is biofilm over the wound bed and foul odor today.  A moderate amount of yellow drainage was debrided from the wound bed with gauze.  There is no periwound irritation or signs of cellulitis in the leg.  Her edema is improved where the compression was actually applied but there is a large amount of edema above the areas of compression.  She has good palpable pedal pulses.  Imaging: No results found.   Labs: Lab Results  Component Value Date   HGBA1C 6.5 12/03/2017   HGBA1C 6.2 06/29/2017   HGBA1C 5.7 12/11/2016   ESRSEDRATE 56 (H) 03/09/2018   ESRSEDRATE 13 06/02/2016   ESRSEDRATE 24 (H) 01/23/2011   CRP 6.3 (H) 03/09/2018   CRP 2.2 06/02/2016   REPTSTATUS 04/04/2018 FINAL 03/30/2018   GRAMSTAIN  03/30/2018    FEW WBC PRESENT, PREDOMINANTLY PMN RARE GRAM POSITIVE RODS Performed at [facility], [place] [address] [place], [place] [address]    CULT  03/30/2018    MODERATE DIPHTHEROIDS(CORYNEBACTERIUM SPECIES) Standardized susceptibility testing for this organism is not available. RARE PROTEUS MIRABILIS RARE ENTEROCOCCUS FAECALIS NO ANAEROBES ISOLATED    LABORGA PROTEUS MIRABILIS 03/30/2018   LABORGA ENTEROCOCCUS FAECALIS 03/30/2018     Lab Results  Component  Value Date   ALBUMIN 2.2 (L) 03/08/2018   ALBUMIN 4.0 12/14/2017   ALBUMIN 3.8 12/03/2017    Body mass index is 61.79 kg/m.  Orders:  No orders of the defined types were placed in this encounter.  No orders of the defined types were placed in this encounter.    Procedures: No procedures performed  Clinical Data: No additional findings.  ROS:  All other systems negative, except as noted in the HPI. Review of Systems  Objective: Vital Signs: Ht 5\' 4"  (1.626 m)   Wt (!) 360 lb (163.3 kg)   BMI 61.79 kg/m   Specialty Comments:  No specialty comments available.  PMFS History: Patient Active  Problem List   Diagnosis Date Noted  . Onychomycosis 05/19/2018  . Idiopathic chronic venous hypertension of right lower extremity with ulcer and inflammation ([place]) 05/19/2018  . Traumatic wound dehiscence   . Wound of right leg 02/18/2018  . Blister 02/18/2018  . Cellulitis of right leg 01/14/2018  . Skin lumps 01/14/2018  . Bilateral leg cramps 12/14/2017  . Dry skin dermatitis 06/29/2017  . Left shoulder pain 08/14/2016  . Meralgia paresthetica 07/16/2016  . Chronic left-sided low back pain with left-sided sciatica 06/02/2016  . Venous stasis ulcer ([place]) 05/13/2016  . Abscess of right lower leg 03/28/2016  . Whole body pain 03/20/2016  . Diabetic peripheral neuropathy ([place]) 03/20/2016  . Osteopenia 01/11/2016  . CKD (chronic kidney disease) stage 3, GFR 30-59 ml/min (HCC) 01/02/2016  . Hand paresthesia 07/18/2015  . Carpal tunnel syndrome 06/07/2015  . Family history of colon cancer   . Diabetes mellitus with neurological manifestations ([place]) 04/18/2015  . Bilateral leg edema 04/11/2015  . Cellulitis of leg, right 04/11/2015  . Abnormality of gait 01/03/2015  . Hereditary and idiopathic peripheral neuropathy 01/03/2015  . GERD (gastroesophageal reflux disease) 06/17/2014  . Morbid obesity with BMI of 50.0-59.9, adult ([place]) 09/05/2013  . Hx of colonic polyps 12/15/2012  . Vitamin B12 deficiency 11/03/2012  . Intrinsic asthma 03/23/2012  . Chronic diastolic heart failure ([place]) 02/20/2011  . OSA (obstructive sleep apnea) 09/17/2010  . URINARY URGENCY 01/08/2010  . CAD, NATIVE VESSEL 11/20/2008  . OSTEOARTHRITIS, KNEES, BILATERAL, SEVERE 06/14/2008  . Hyperlipidemia 05/10/2007  . Essential hypertension 01/18/2007  . HYPOKALEMIA 04/30/2006  . Body mass index 60.0-69.9, adult ([place]) 04/30/2006  . HX, PERSONAL, PEPTIC ULCER DISEASE 04/30/2006   Past Medical History:  Diagnosis Date  . Anemia   . Asthma   . CAD (coronary artery disease)   . Carpal tunnel syndrome   .  Cellulitis of both lower extremities 04/11/2015  . CHF (congestive heart failure) ([place])   . Colon polyp, hyperplastic 2007 & 2012  . Complication of anesthesia 1999   svt with renal calculi surgery, no problems since  . CTS (carpal tunnel syndrome)   . Diabetes mellitus   . Eczema   . GERD (gastroesophageal reflux disease)   . Hyperlipidemia   . Hypertension   . Leg ulcer ([place]) 04/24/2015   Right lateral leg No evidence of an infection Monitor closely Keep edema controlled   . Leg ulcer ([place])    right lower  . Meralgia paresthetica    Dr. [name]  . Morbid obesity ([place])   . Morbid obesity ([place])   . Neuropathy    toes and legs  . Osteoarthrosis, unspecified whether generalized or localized, lower leg   . PUD (peptic ulcer disease)   . Shortness of breath dyspnea    with exertion  .  Sleep apnea    per progress note 02/25/2018  . Type II or unspecified type diabetes mellitus without mention of complication, not stated as uncontrolled   . Unspecified hereditary and idiopathic peripheral neuropathy   . Vitamin B12 deficiency   . Wears glasses   . Wound cellulitis    right upper leg, healing well    Family History  Problem Relation Age of Onset  . Colon cancer Mother   . Prostate cancer Father   . Colon cancer Father   . Diabetes Maternal Aunt   . Breast cancer Maternal Aunt   . Diabetes Maternal Uncle   . Diabetes Paternal Aunt   . Stroke Paternal Aunt        > 65  . Heart disease Paternal Aunt   . Diabetes Paternal Uncle   . Breast cancer Maternal Aunt         X 2  . Breast cancer Cousin     Past Surgical History:  Procedure Laterality Date  . ABDOMINAL HYSTERECTOMY    . CARDIAC CATHETERIZATION  2002   non obstructive disease  . colonoscopy with polypectomy  2007 & 2012    hyperplastic ;Dr Watt Climes  . COLONOSCOPY WITH PROPOFOL N/A 06/04/2015   Procedure: COLONOSCOPY WITH PROPOFOL;  Surgeon: Jerene Bears, MD;  Location: [facility];  Service: Gastroenterology;  Laterality:  N/A;  . DEBRIDEMENT LEG Right 03/02/2018   WOUND VAC APPLIED  . DILATION AND CURETTAGE OF UTERUS     multiple  . HEMORRHOID SURGERY    . I&D EXTREMITY Right 03/02/2018   Procedure: RIGHT LEG DEBRIDEMENT AND PLACE VAC;  Surgeon: [name], MD;  Location: [place];  Service: Orthopedics;  Laterality: Right;  . I&D EXTREMITY Right 03/04/2018   Procedure: REPEAT IRRIGATION AND DEBRIDEMENT RIGHT LEG, PLACE WOUND VAC;  Surgeon: [name], MD;  Location: [place] [facility];  Service: Orthopedics;  Laterality: Right;  . I&D EXTREMITY Right 03/30/2018   Procedure: IRRIGATION AND DEBRIDEMENT RIGHT LEG, APPLY WOUND VAC;  Surgeon: [name], MD;  Location: [place];  Service: Orthopedics;  Laterality: Right;  . renal calculi  12/1997   SVT with induction of anesthesia  . right knee arthroscopy    . SKIN SPLIT GRAFT Right 03/04/2018   Procedure: POSSIBLE SPLIT THICKNESS SKIN GRAFT;  Surgeon: [name], MD;  Location: [place];  Service: Orthopedics;  Laterality: Right;  . SKIN SPLIT GRAFT Right 04/01/2018   Procedure: REPEAT IRRIGATION AND DEBRIDEMENT RIGHT LEG, APPLY SPLIT THICKNESS SKIN GRAFT;  Surgeon: [name], MD;  Location: [place];  Service: Orthopedics;  Laterality: Right;  . TONSILLECTOMY AND ADENOIDECTOMY     Social History   Occupational History  . Occupation: Disabled    Employer: RETIRED  Tobacco Use  . Smoking status: Never Smoker  . Smokeless tobacco: Never Used  Substance and Sexual Activity  . Alcohol use: No    Alcohol/week: 0.0 standard drinks  . Drug use: No  . Sexual activity: Not on file

## 2018-05-24 ENCOUNTER — Ambulatory Visit (INDEPENDENT_AMBULATORY_CARE_PROVIDER_SITE_OTHER): Payer: Self-pay | Admitting: Physician Assistant

## 2018-05-26 ENCOUNTER — Telehealth: Payer: Self-pay | Admitting: Internal Medicine

## 2018-05-26 ENCOUNTER — Ambulatory Visit (INDEPENDENT_AMBULATORY_CARE_PROVIDER_SITE_OTHER): Payer: Medicare Other | Admitting: Orthopedic Surgery

## 2018-05-26 ENCOUNTER — Encounter (HOSPITAL_COMMUNITY): Payer: Self-pay | Admitting: *Deleted

## 2018-05-26 ENCOUNTER — Other Ambulatory Visit: Payer: Self-pay

## 2018-05-26 NOTE — Telephone Encounter (Signed)
Copied from [place] [id number]. Topic: Quick Communication - Home Health Verbal Orders >> May 26, 2018  9:02 AM Sharene Skeans wrote: Caller/Agency: [facility] Number: [phone] Requesting Skilled nursing/wound care  Frequency: 3x a week (Monday, wednesday and Friday)  Patient called and stated her nurse advised her to have Dr. Quay Burow call in new orders. Pt is not sure of the frequency. Pt has surgery tomorrow and she will need nurse after surgery / please advise

## 2018-05-26 NOTE — Anesthesia Preprocedure Evaluation (Addendum)
Anesthesia Evaluation  Patient identified by MRN, date of birth, ID band Patient awake    Reviewed: Allergy & Precautions, NPO status , Patient's Chart, lab work & pertinent test results  Airway Mallampati: II  TM Distance: >3 FB     Dental   Pulmonary shortness of breath, sleep apnea ,    breath sounds clear to auscultation       Cardiovascular hypertension, + CAD and +CHF   Rhythm:Regular     Neuro/Psych    GI/Hepatic PUD, GERD  ,  Endo/Other  diabetes  Renal/GU Renal disease     Musculoskeletal   Abdominal   Peds  Hematology   Anesthesia Other Findings   Reproductive/Obstetrics                           Anesthesia Physical Anesthesia Plan  ASA: III  Anesthesia Plan: General   Post-op Pain Management:    Induction: Intravenous  PONV Risk Score and Plan: Ondansetron and Dexamethasone  Airway Management Planned: Oral ETT  Additional Equipment:   Intra-op Plan:   Post-operative Plan: Possible Post-op intubation/ventilation  Informed Consent: I have reviewed the patients History and Physical, chart, labs and discussed the procedure including the risks, benefits and alternatives for the proposed anesthesia with the patient or authorized representative who has indicated his/her understanding and acceptance.     Dental advisory given  Plan Discussed with: Anesthesiologist and CRNA  Anesthesia Plan Comments:        Anesthesia Quick Evaluation

## 2018-05-26 NOTE — Progress Notes (Signed)
April [name] denies chest pain, ha shortness of breath on ePatient's PCP is Dr. Celso Amy, Cardiologist is Dr Haroldine Laws- she hasn't seen him since 2015. Patient has type Ii diabetes, she reports that CBGs run 98-128.  Patient is not on any medication. I instructed patient to check CBG after awaking and every 2 hours until arrival  to the hospital.  I Instructed patient if CBG is less than 70 to drink 1/2 cup of a clear juice. Recheck CBG in 15 minutes then call pre- op desk at [phone] for further instructions.  Patient was tested x1 for sleep apnea- "they say I have it, but I never had a machine."   Patient denies that she or her family has experienced any of the following: Cough Fever >100.4 Runny Nose Sore Throat Difficulty breathing/ shortness of breath Travel in past 14 days- none

## 2018-05-26 NOTE — Telephone Encounter (Signed)
Spoke with Linus Orn with [facility] and she states that orders will need to come from Dr doing surgery tomorrow. Contacted pt to give information.

## 2018-05-27 ENCOUNTER — Inpatient Hospital Stay (HOSPITAL_COMMUNITY)
Admission: AD | Admit: 2018-05-27 | Discharge: 2018-06-03 | DRG: 623 | Disposition: A | Discharge status: Home Health | Payer: Medicare Other | Attending: Orthopedic Surgery | Admitting: Orthopedic Surgery

## 2018-05-27 ENCOUNTER — Ambulatory Visit (HOSPITAL_COMMUNITY): Payer: Medicare Other | Admitting: Anesthesiology

## 2018-05-27 ENCOUNTER — Encounter (HOSPITAL_COMMUNITY)
Admission: AD | Disposition: A | Discharge status: Home Health | Payer: Self-pay | Source: Home / Self Care | Attending: Orthopedic Surgery

## 2018-05-27 ENCOUNTER — Encounter (HOSPITAL_COMMUNITY): Payer: Self-pay | Admitting: General Practice

## 2018-05-27 DIAGNOSIS — I5032 Chronic diastolic (congestive) heart failure: Secondary | ICD-10-CM | POA: Diagnosis present

## 2018-05-27 DIAGNOSIS — I130 Hypertensive heart and chronic kidney disease with heart failure and stage 1 through stage 4 chronic kidney disease, or unspecified chronic kidney disease: Secondary | ICD-10-CM | POA: Diagnosis present

## 2018-05-27 DIAGNOSIS — Z90710 Acquired absence of both cervix and uterus: Secondary | ICD-10-CM

## 2018-05-27 DIAGNOSIS — Z79899 Other long term (current) drug therapy: Secondary | ICD-10-CM

## 2018-05-27 DIAGNOSIS — S81801S Unspecified open wound, right lower leg, sequela: Secondary | ICD-10-CM

## 2018-05-27 DIAGNOSIS — Z833 Family history of diabetes mellitus: Secondary | ICD-10-CM | POA: Diagnosis not present

## 2018-05-27 DIAGNOSIS — R05 Cough: Secondary | ICD-10-CM | POA: Diagnosis not present

## 2018-05-27 DIAGNOSIS — M79661 Pain in right lower leg: Secondary | ICD-10-CM | POA: Diagnosis present

## 2018-05-27 DIAGNOSIS — Z6841 Body Mass Index (BMI) 40.0 and over, adult: Secondary | ICD-10-CM | POA: Diagnosis not present

## 2018-05-27 DIAGNOSIS — Z7982 Long term (current) use of aspirin: Secondary | ICD-10-CM

## 2018-05-27 DIAGNOSIS — N189 Chronic kidney disease, unspecified: Secondary | ICD-10-CM | POA: Diagnosis present

## 2018-05-27 DIAGNOSIS — J45909 Unspecified asthma, uncomplicated: Secondary | ICD-10-CM | POA: Diagnosis present

## 2018-05-27 DIAGNOSIS — M25579 Pain in unspecified ankle and joints of unspecified foot: Secondary | ICD-10-CM | POA: Diagnosis not present

## 2018-05-27 DIAGNOSIS — K219 Gastro-esophageal reflux disease without esophagitis: Secondary | ICD-10-CM | POA: Diagnosis present

## 2018-05-27 DIAGNOSIS — M79609 Pain in unspecified limb: Secondary | ICD-10-CM | POA: Diagnosis not present

## 2018-05-27 DIAGNOSIS — E11622 Type 2 diabetes mellitus with other skin ulcer: Secondary | ICD-10-CM | POA: Diagnosis present

## 2018-05-27 DIAGNOSIS — Z720 Tobacco use: Secondary | ICD-10-CM | POA: Diagnosis not present

## 2018-05-27 DIAGNOSIS — M25572 Pain in left ankle and joints of left foot: Secondary | ICD-10-CM | POA: Diagnosis not present

## 2018-05-27 DIAGNOSIS — E1122 Type 2 diabetes mellitus with diabetic chronic kidney disease: Secondary | ICD-10-CM | POA: Diagnosis present

## 2018-05-27 DIAGNOSIS — M79672 Pain in left foot: Secondary | ICD-10-CM | POA: Diagnosis not present

## 2018-05-27 DIAGNOSIS — L97919 Non-pressure chronic ulcer of unspecified part of right lower leg with unspecified severity: Secondary | ICD-10-CM | POA: Diagnosis present

## 2018-05-27 DIAGNOSIS — Z452 Encounter for adjustment and management of vascular access device: Secondary | ICD-10-CM

## 2018-05-27 DIAGNOSIS — E785 Hyperlipidemia, unspecified: Secondary | ICD-10-CM | POA: Diagnosis present

## 2018-05-27 DIAGNOSIS — Z8249 Family history of ischemic heart disease and other diseases of the circulatory system: Secondary | ICD-10-CM | POA: Diagnosis not present

## 2018-05-27 DIAGNOSIS — Z823 Family history of stroke: Secondary | ICD-10-CM | POA: Diagnosis not present

## 2018-05-27 DIAGNOSIS — R059 Cough, unspecified: Secondary | ICD-10-CM

## 2018-05-27 DIAGNOSIS — J40 Bronchitis, not specified as acute or chronic: Secondary | ICD-10-CM | POA: Diagnosis not present

## 2018-05-27 DIAGNOSIS — I2510 Atherosclerotic heart disease of native coronary artery without angina pectoris: Secondary | ICD-10-CM | POA: Diagnosis present

## 2018-05-27 HISTORY — PX: DEBRIDEMENT LEG: SUR390

## 2018-05-27 HISTORY — PX: I & D EXTREMITY: SHX5045

## 2018-05-27 HISTORY — DX: Chronic kidney disease, unspecified: N18.9

## 2018-05-27 HISTORY — DX: Personal history of urinary calculi: Z87.442

## 2018-05-27 LAB — GLUCOSE, CAPILLARY
Glucose-Capillary: 123 mg/dL — ABNORMAL HIGH (ref 70–99)
Glucose-Capillary: 116 mg/dL — ABNORMAL HIGH (ref 70–99)
Glucose-Capillary: 121 mg/dL — ABNORMAL HIGH (ref 70–99)
Glucose-Capillary: 128 mg/dL — ABNORMAL HIGH (ref 70–99)
Glucose-Capillary: 111 mg/dL — ABNORMAL HIGH (ref 70–99)

## 2018-05-27 LAB — CBC
HCT: 37.6 % (ref 36.0–46.0)
Hemoglobin: 11.4 g/dL — ABNORMAL LOW (ref 12.0–15.0)
MCH: 28.3 pg (ref 26.0–34.0)
MCHC: 30.3 g/dL (ref 30.0–36.0)
MCV: 93.3 fL (ref 80.0–100.0)
Platelets: 179 10*3/uL (ref 150–400)
RBC: 4.03 MIL/uL (ref 3.87–5.11)
RDW: 15.4 % (ref 11.5–15.5)
WBC: 5.2 10*3/uL (ref 4.0–10.5)
nRBC: 0 % (ref 0.0–0.2)

## 2018-05-27 LAB — HEMOGLOBIN A1C
Hgb A1c MFr Bld: 6.1 % — ABNORMAL HIGH (ref 4.8–5.6)
Mean Plasma Glucose: 128.37 mg/dL

## 2018-05-27 LAB — BASIC METABOLIC PANEL
Anion gap: 11 (ref 5–15)
BUN: 36 mg/dL — ABNORMAL HIGH (ref 8–23)
CO2: 27 mmol/L (ref 22–32)
Calcium: 8.9 mg/dL (ref 8.9–10.3)
Chloride: 101 mmol/L (ref 98–111)
Creatinine, Ser: 1.74 mg/dL — ABNORMAL HIGH (ref 0.44–1.00)
GFR calc Af Amer: 35 mL/min — ABNORMAL LOW (ref >60)
GFR calc non Af Amer: 30 mL/min — ABNORMAL LOW (ref >60)
Glucose, Bld: 134 mg/dL — ABNORMAL HIGH (ref 70–99)
Potassium: 4 mmol/L (ref 3.5–5.1)
Sodium: 139 mmol/L (ref 135–145)

## 2018-05-27 SURGERY — IRRIGATION AND DEBRIDEMENT EXTREMITY
Anesthesia: General | Laterality: Right

## 2018-05-27 MED ORDER — ONDANSETRON HCL 4 MG PO TABS: 4.0000 mg | ORAL_TABLET | Freq: Four times a day (QID) | ORAL | Status: DC | PRN

## 2018-05-27 MED ORDER — ALBUTEROL SULFATE HFA 108 (90 BASE) MCG/ACT IN AERS: 2.0000 | INHALATION_SPRAY | RESPIRATORY_TRACT | Status: DC | PRN

## 2018-05-27 MED ORDER — FENTANYL CITRATE (PF) 100 MCG/2ML IJ SOLN: 25.0000 ug | INTRAMUSCULAR | Status: DC | PRN

## 2018-05-27 MED ORDER — FENTANYL CITRATE (PF) 250 MCG/5ML IJ SOLN
INTRAMUSCULAR | Status: AC
  Filled 2018-05-27: qty 5

## 2018-05-27 MED ORDER — LACTATED RINGERS IV SOLN
INTRAVENOUS | Status: DC | PRN
  Administered 2018-05-27: 07:00:00 via INTRAVENOUS

## 2018-05-27 MED ORDER — SODIUM CHLORIDE 0.9 % IV SOLN
INTRAVENOUS | Status: DC
  Administered 2018-05-27 – 2018-05-30 (×4): via INTRAVENOUS

## 2018-05-27 MED ORDER — ONDANSETRON HCL 4 MG/2ML IJ SOLN
INTRAMUSCULAR | Status: DC | PRN
  Administered 2018-05-27: 4 mg via INTRAVENOUS

## 2018-05-27 MED ORDER — SIMVASTATIN 20 MG PO TABS
40.0000 mg | ORAL_TABLET | Freq: Every day | ORAL | Status: DC
  Administered 2018-05-27 – 2018-06-02 (×7): 40 mg via ORAL
  Filled 2018-05-27 (×7): qty 2

## 2018-05-27 MED ORDER — FENTANYL CITRATE (PF) 250 MCG/5ML IJ SOLN
INTRAMUSCULAR | Status: DC | PRN
  Administered 2018-05-27: 100 ug via INTRAVENOUS

## 2018-05-27 MED ORDER — CLINDAMYCIN PHOSPHATE 900 MG/50ML IV SOLN
900.0000 mg | INTRAVENOUS | Status: AC
  Administered 2018-05-27: 900 mg via INTRAVENOUS
  Filled 2018-05-27: qty 50

## 2018-05-27 MED ORDER — PHENYLEPHRINE 40 MCG/ML (10ML) SYRINGE FOR IV PUSH (FOR BLOOD PRESSURE SUPPORT)
PREFILLED_SYRINGE | INTRAVENOUS | Status: DC | PRN
  Administered 2018-05-27 (×2): 80 ug via INTRAVENOUS
  Administered 2018-05-27 (×2): 120 ug via INTRAVENOUS

## 2018-05-27 MED ORDER — BISACODYL 10 MG RE SUPP: 10.0000 mg | Freq: Every day | RECTAL | Status: DC | PRN

## 2018-05-27 MED ORDER — SUCCINYLCHOLINE CHLORIDE 200 MG/10ML IV SOSY
PREFILLED_SYRINGE | INTRAVENOUS | Status: DC | PRN
  Administered 2018-05-27: 100 mg via INTRAVENOUS

## 2018-05-27 MED ORDER — ACETAMINOPHEN 325 MG PO TABS
325.0000 mg | ORAL_TABLET | Freq: Four times a day (QID) | ORAL | Status: DC | PRN
  Administered 2018-05-27 – 2018-06-03 (×9): 650 mg via ORAL
  Filled 2018-05-27 (×10): qty 2

## 2018-05-27 MED ORDER — INSULIN ASPART 100 UNIT/ML ~~LOC~~ SOLN
0.0000 [IU] | Freq: Three times a day (TID) | SUBCUTANEOUS | Status: DC
  Administered 2018-05-27 – 2018-05-30 (×3): 3 [IU] via SUBCUTANEOUS
  Administered 2018-05-30 – 2018-06-02 (×4): 4 [IU] via SUBCUTANEOUS

## 2018-05-27 MED ORDER — GLYCOPYRROLATE 0.2 MG/ML IJ SOLN
INTRAMUSCULAR | Status: DC | PRN
  Administered 2018-05-27: 0.1 mg via INTRAVENOUS

## 2018-05-27 MED ORDER — POLYETHYLENE GLYCOL 3350 17 G PO PACK: 17.0000 g | PACK | Freq: Every day | ORAL | Status: DC | PRN

## 2018-05-27 MED ORDER — OXYCODONE HCL 5 MG PO TABS
10.0000 mg | ORAL_TABLET | ORAL | Status: DC | PRN
  Administered 2018-05-28 (×2): 10 mg via ORAL
  Administered 2018-05-29 – 2018-06-02 (×8): 15 mg via ORAL
  Administered 2018-06-02 – 2018-06-03 (×3): 10 mg via ORAL
  Filled 2018-05-27 (×3): qty 3
  Filled 2018-05-27: qty 2
  Filled 2018-05-27 (×2): qty 3
  Filled 2018-05-27: qty 2
  Filled 2018-05-27 (×4): qty 3
  Filled 2018-05-27: qty 2
  Filled 2018-05-27: qty 3
  Filled 2018-05-27: qty 2

## 2018-05-27 MED ORDER — EPHEDRINE SULFATE-NACL 50-0.9 MG/10ML-% IV SOSY
PREFILLED_SYRINGE | INTRAVENOUS | Status: DC | PRN
  Administered 2018-05-27: 10 mg via INTRAVENOUS

## 2018-05-27 MED ORDER — LIDOCAINE 2% (20 MG/ML) 5 ML SYRINGE
INTRAMUSCULAR | Status: DC | PRN
  Administered 2018-05-27: 60 mg via INTRAVENOUS

## 2018-05-27 MED ORDER — FUROSEMIDE 80 MG PO TABS
120.0000 mg | ORAL_TABLET | Freq: Two times a day (BID) | ORAL | Status: DC
  Administered 2018-05-27 – 2018-06-03 (×14): 120 mg via ORAL
  Filled 2018-05-27 (×14): qty 1

## 2018-05-27 MED ORDER — OXCARBAZEPINE 150 MG PO TABS
150.0000 mg | ORAL_TABLET | Freq: Two times a day (BID) | ORAL | Status: DC
  Administered 2018-05-27 – 2018-06-03 (×13): 150 mg via ORAL
  Filled 2018-05-27 (×19): qty 1

## 2018-05-27 MED ORDER — ASPIRIN EC 81 MG PO TBEC
81.0000 mg | DELAYED_RELEASE_TABLET | Freq: Every day | ORAL | Status: DC
  Administered 2018-05-27 – 2018-06-02 (×7): 81 mg via ORAL
  Filled 2018-05-27 (×7): qty 1

## 2018-05-27 MED ORDER — OXYCODONE HCL 5 MG PO TABS
ORAL_TABLET | ORAL | Status: AC
  Administered 2018-05-27: 10 mg via ORAL
  Filled 2018-05-27: qty 2

## 2018-05-27 MED ORDER — POTASSIUM CHLORIDE CRYS ER 20 MEQ PO TBCR
60.0000 meq | EXTENDED_RELEASE_TABLET | Freq: Every day | ORAL | Status: DC
  Administered 2018-05-28 – 2018-06-03 (×7): 60 meq via ORAL
  Filled 2018-05-27 (×7): qty 3

## 2018-05-27 MED ORDER — MOMETASONE FURO-FORMOTEROL FUM 200-5 MCG/ACT IN AERO
2.0000 | INHALATION_SPRAY | Freq: Two times a day (BID) | RESPIRATORY_TRACT | Status: DC
  Administered 2018-05-27 – 2018-06-03 (×12): 2 via RESPIRATORY_TRACT
  Filled 2018-05-27: qty 8.8

## 2018-05-27 MED ORDER — METHOCARBAMOL 1000 MG/10ML IJ SOLN
500.0000 mg | Freq: Four times a day (QID) | INTRAVENOUS | Status: DC | PRN
  Filled 2018-05-27: qty 5

## 2018-05-27 MED ORDER — MIDAZOLAM HCL 2 MG/2ML IJ SOLN
INTRAMUSCULAR | Status: DC | PRN
  Administered 2018-05-27: 2 mg via INTRAVENOUS

## 2018-05-27 MED ORDER — METOCLOPRAMIDE HCL 5 MG/ML IJ SOLN: 5.0000 mg | Freq: Three times a day (TID) | INTRAMUSCULAR | Status: DC | PRN

## 2018-05-27 MED ORDER — METOCLOPRAMIDE HCL 5 MG PO TABS: 5.0000 mg | ORAL_TABLET | Freq: Three times a day (TID) | ORAL | Status: DC | PRN

## 2018-05-27 MED ORDER — MIDAZOLAM HCL 2 MG/2ML IJ SOLN
INTRAMUSCULAR | Status: AC
  Filled 2018-05-27: qty 2

## 2018-05-27 MED ORDER — HYDROMORPHONE HCL 1 MG/ML IJ SOLN
0.5000 mg | INTRAMUSCULAR | Status: DC | PRN
  Administered 2018-05-27 – 2018-05-30 (×5): 1 mg via INTRAVENOUS
  Filled 2018-05-27 (×5): qty 1

## 2018-05-27 MED ORDER — MAGNESIUM CITRATE PO SOLN: 1.0000 | Freq: Once | ORAL | Status: DC | PRN

## 2018-05-27 MED ORDER — ONDANSETRON HCL 4 MG/2ML IJ SOLN
4.0000 mg | Freq: Four times a day (QID) | INTRAMUSCULAR | Status: DC | PRN
  Administered 2018-05-27: 4 mg via INTRAVENOUS
  Filled 2018-05-27: qty 2

## 2018-05-27 MED ORDER — ALBUTEROL SULFATE (2.5 MG/3ML) 0.083% IN NEBU: 2.5000 mg | INHALATION_SOLUTION | Freq: Four times a day (QID) | RESPIRATORY_TRACT | Status: DC | PRN

## 2018-05-27 MED ORDER — PHENYLEPHRINE HCL (PRESSORS) 10 MG/ML IV SOLN: INTRAVENOUS | Status: DC | PRN

## 2018-05-27 MED ORDER — PRO-STAT SUGAR FREE PO LIQD
30.0000 mL | Freq: Three times a day (TID) | ORAL | Status: DC
  Administered 2018-05-27 – 2018-06-03 (×21): 30 mL via ORAL
  Filled 2018-05-27 (×21): qty 30

## 2018-05-27 MED ORDER — METHOCARBAMOL 500 MG PO TABS
500.0000 mg | ORAL_TABLET | Freq: Four times a day (QID) | ORAL | Status: DC | PRN
  Administered 2018-05-27 – 2018-06-03 (×17): 500 mg via ORAL
  Filled 2018-05-27 (×18): qty 1

## 2018-05-27 MED ORDER — PROPOFOL 10 MG/ML IV BOLUS
INTRAVENOUS | Status: AC
  Filled 2018-05-27: qty 40

## 2018-05-27 MED ORDER — 0.9 % SODIUM CHLORIDE (POUR BTL) OPTIME
TOPICAL | Status: DC | PRN
  Administered 2018-05-27: 08:00:00 1000 mL

## 2018-05-27 MED ORDER — FAMOTIDINE 20 MG PO TABS
20.0000 mg | ORAL_TABLET | Freq: Two times a day (BID) | ORAL | Status: DC
  Administered 2018-05-27 – 2018-06-03 (×15): 20 mg via ORAL
  Filled 2018-05-27 (×15): qty 1

## 2018-05-27 MED ORDER — CLINDAMYCIN PHOSPHATE 600 MG/50ML IV SOLN
600.0000 mg | Freq: Three times a day (TID) | INTRAVENOUS | Status: DC
  Administered 2018-05-27 – 2018-05-31 (×12): 600 mg via INTRAVENOUS
  Filled 2018-05-27 (×12): qty 50

## 2018-05-27 MED ORDER — MOMETASONE FURO-FORMOTEROL FUM 200-5 MCG/ACT IN AERO: 2.0000 | INHALATION_SPRAY | Freq: Two times a day (BID) | RESPIRATORY_TRACT | Status: DC

## 2018-05-27 MED ORDER — OXYCODONE HCL 5 MG PO TABS
5.0000 mg | ORAL_TABLET | ORAL | Status: DC | PRN
  Administered 2018-05-27 (×2): 10 mg via ORAL
  Administered 2018-05-28: 5 mg via ORAL
  Administered 2018-05-28 – 2018-06-03 (×10): 10 mg via ORAL
  Filled 2018-05-27 (×13): qty 2

## 2018-05-27 MED ORDER — HYDROXYZINE HCL 25 MG PO TABS
25.0000 mg | ORAL_TABLET | Freq: Four times a day (QID) | ORAL | Status: DC | PRN
  Administered 2018-05-27 – 2018-05-29 (×4): 25 mg via ORAL
  Filled 2018-05-27 (×4): qty 1

## 2018-05-27 MED ORDER — CHLORHEXIDINE GLUCONATE 4 % EX LIQD: 60.0000 mL | Freq: Once | CUTANEOUS | Status: DC

## 2018-05-27 MED ORDER — SPIRONOLACTONE 25 MG PO TABS
25.0000 mg | ORAL_TABLET | Freq: Every day | ORAL | Status: DC
  Administered 2018-05-27 – 2018-06-03 (×8): 25 mg via ORAL
  Filled 2018-05-27 (×8): qty 1

## 2018-05-27 MED ORDER — DOCUSATE SODIUM 100 MG PO CAPS
100.0000 mg | ORAL_CAPSULE | Freq: Two times a day (BID) | ORAL | Status: DC
  Administered 2018-05-27 – 2018-06-03 (×13): 100 mg via ORAL
  Filled 2018-05-27 (×14): qty 1

## 2018-05-27 MED ORDER — PROPOFOL 10 MG/ML IV BOLUS
INTRAVENOUS | Status: DC | PRN
  Administered 2018-05-27: 100 mg via INTRAVENOUS

## 2018-05-27 MED ORDER — CARVEDILOL 25 MG PO TABS
25.0000 mg | ORAL_TABLET | Freq: Two times a day (BID) | ORAL | Status: DC
  Administered 2018-05-27 – 2018-06-03 (×14): 25 mg via ORAL
  Filled 2018-05-27 (×14): qty 1

## 2018-05-27 MED ORDER — INSULIN ASPART 100 UNIT/ML ~~LOC~~ SOLN
6.0000 [IU] | Freq: Three times a day (TID) | SUBCUTANEOUS | Status: DC
  Administered 2018-05-27 – 2018-06-03 (×8): 6 [IU] via SUBCUTANEOUS

## 2018-05-27 SURGICAL SUPPLY — 38 items
BLADE SURG 21 STRL SS (BLADE) ×3 IMPLANT
BNDG COHESIVE 6X5 TAN STRL LF (GAUZE/BANDAGES/DRESSINGS) ×2 IMPLANT
BNDG GAUZE ELAST 4 BULKY (GAUZE/BANDAGES/DRESSINGS) ×6 IMPLANT
CANISTER WOUNDNEG PRESSURE 500 (CANNISTER) ×2 IMPLANT
COVER SURGICAL LIGHT HANDLE (MISCELLANEOUS) ×2 IMPLANT
COVER WAND RF STERILE (DRAPES) ×1 IMPLANT
DRAPE INCISE IOBAN 66X45 STRL (DRAPES) ×2 IMPLANT
DRAPE U-SHAPE 47X51 STRL (DRAPES) ×3 IMPLANT
DRESSING VERAFLO CLEANSE CC (GAUZE/BANDAGES/DRESSINGS) IMPLANT
DRSG ADAPTIC 3X8 NADH LF (GAUZE/BANDAGES/DRESSINGS) ×3 IMPLANT
DRSG VERAFLO CLEANSE CC (GAUZE/BANDAGES/DRESSINGS) ×3
DURAPREP 26ML APPLICATOR (WOUND CARE) ×3 IMPLANT
ELECT REM PT RETURN 9FT ADLT (ELECTROSURGICAL) ×3
ELECTRODE REM PT RTRN 9FT ADLT (ELECTROSURGICAL) IMPLANT
GAUZE SPONGE 4X4 12PLY STRL (GAUZE/BANDAGES/DRESSINGS) ×3 IMPLANT
GLOVE BIOGEL PI IND STRL 9 (GLOVE) ×1 IMPLANT
GLOVE BIOGEL PI INDICATOR 9 (GLOVE) ×2
GLOVE SURG ORTHO 9.0 STRL STRW (GLOVE) ×3 IMPLANT
GOWN STRL REUS W/ TWL XL LVL3 (GOWN DISPOSABLE) ×2 IMPLANT
GOWN STRL REUS W/TWL XL LVL3 (GOWN DISPOSABLE) ×6
GRAFT MESHED PRIMATRIX 8X12 (Graft) ×2 IMPLANT
HANDPIECE INTERPULSE COAX TIP (DISPOSABLE)
KIT BASIN OR (CUSTOM PROCEDURE TRAY) ×3 IMPLANT
KIT DRSG PREVENA PLUS 7DAY 125 (MISCELLANEOUS) ×2 IMPLANT
KIT TURNOVER KIT B (KITS) ×3 IMPLANT
MANIFOLD NEPTUNE II (INSTRUMENTS) ×1 IMPLANT
NS IRRIG 1000ML POUR BTL (IV SOLUTION) ×3 IMPLANT
PACK ORTHO EXTREMITY (CUSTOM PROCEDURE TRAY) ×3 IMPLANT
PAD ARMBOARD 7.5X6 YLW CONV (MISCELLANEOUS) ×6 IMPLANT
PAD NEG PRESSURE SENSATRAC (MISCELLANEOUS) ×2 IMPLANT
SET HNDPC FAN SPRY TIP SCT (DISPOSABLE) IMPLANT
STOCKINETTE IMPERVIOUS 9X36 MD (GAUZE/BANDAGES/DRESSINGS) IMPLANT
SWAB COLLECTION DEVICE MRSA (MISCELLANEOUS) ×3 IMPLANT
SWAB CULTURE ESWAB REG 1ML (MISCELLANEOUS) IMPLANT
TOWEL OR 17X26 10 PK STRL BLUE (TOWEL DISPOSABLE) ×3 IMPLANT
TUBE CONNECTING 12'X1/4 (SUCTIONS) ×1
TUBE CONNECTING 12X1/4 (SUCTIONS) ×2 IMPLANT
YANKAUER SUCT BULB TIP NO VENT (SUCTIONS) ×3 IMPLANT

## 2018-05-27 NOTE — Anesthesia Postprocedure Evaluation (Signed)
Anesthesia Post Note  Patient: Surveyor, mining  Procedure(s) Performed: RIGHT LOWER LEG DEBRIDEMENT,  SKIN GRAFT, VAC PLACEMENT (Right )     Patient location during evaluation: PACU Anesthesia Type: General Level of consciousness: awake Pain management: pain level controlled Vital Signs Assessment: post-procedure vital signs reviewed and stable Respiratory status: spontaneous breathing Cardiovascular status: stable Postop Assessment: no apparent nausea or vomiting Anesthetic complications: no    Last Vitals:  Vitals:   05/27/18 0555 05/27/18 0831  BP: 127/64 (!) 122/52  Pulse: 83 86  Resp: 20 19  Temp: 36.9 C 36.7 C  SpO2: 95% 96%    Last Pain:  Vitals:   05/27/18 0845  TempSrc:   PainSc: 7                  Domanique Luckett

## 2018-05-27 NOTE — H&P (Signed)
April [name] is an 68 y.o. female.   Chief Complaint: Chronic ulcer right lower leg  HPI: The patient is a 68 year old woman who has undergone several debridements and skin graft placement  of her  very large venous ulcer of the right lower extremity.  She was placed in a medical compression stocking but reports that the stockings kept rolling down and she had the nurse apply a multilayer compression wrap but these have also slid down over the past week.  She has had a lot of increased drainage from the residual ulcer and there is evidence of biofilm over the area today.  There is also odor over the area. She presents for repeat debridement and skin graft placement with VAC placement today.    Past Medical History:  Diagnosis Date  . Anemia   . Asthma   . CAD (coronary artery disease)   . Carpal tunnel syndrome   . Cellulitis of both lower extremities 04/11/2015  . CHF (congestive heart failure) ([place])   . Chronic kidney disease    [facility]- followed by [facility]  . Colon polyp, hyperplastic 2007 & 2012  . Complication of anesthesia 1999   svt with renal calculi surgery, no problems since  . CTS (carpal tunnel syndrome)    bilateral  . Diabetes mellitus   . Eczema   . GERD (gastroesophageal reflux disease)   . History of kidney stones 1999  . Hyperlipidemia   . Hypertension   . Leg ulcer ([place]) 04/24/2015   Right lateral leg No evidence of an infection Monitor closely Keep edema controlled   . Leg ulcer ([place])    right lower  . Meralgia paresthetica    Dr. [name]  . Morbid obesity ([place])   . Morbid obesity ([place])   . Neuropathy    toes and legs  . Osteoarthrosis, unspecified whether generalized or localized, lower leg    knee  . PUD (peptic ulcer disease)   . Shortness of breath dyspnea    with exertion  . Sleep apnea    per progress note 02/25/2018  . Type II or unspecified type diabetes mellitus without mention of complication, not stated as uncontrolled   . Unspecified  hereditary and idiopathic peripheral neuropathy   . Vitamin B12 deficiency   . Wears glasses   . Wound cellulitis    right upper leg, healing well    Past Surgical History:  Procedure Laterality Date  . ABDOMINAL HYSTERECTOMY    . CARDIAC CATHETERIZATION  2002   non obstructive disease  . colonoscopy with polypectomy  2007 & 2012    hyperplastic ;Dr Watt Climes  . COLONOSCOPY WITH PROPOFOL N/A 06/04/2015   Procedure: COLONOSCOPY WITH PROPOFOL;  Surgeon: Jerene Bears, MD;  Location: [facility];  Service: Gastroenterology;  Laterality: N/A;  . DEBRIDEMENT LEG Right 03/02/2018   WOUND VAC APPLIED  . DILATION AND CURETTAGE OF UTERUS     multiple  . HEMORRHOID SURGERY    . I&D EXTREMITY Right 03/02/2018   Procedure: RIGHT LEG DEBRIDEMENT AND PLACE VAC;  Surgeon: [name], MD;  Location: [place];  Service: Orthopedics;  Laterality: Right;  . I&D EXTREMITY Right 03/04/2018   Procedure: REPEAT IRRIGATION AND DEBRIDEMENT RIGHT LEG, PLACE WOUND VAC;  Surgeon: [name], MD;  Location: [place];  Service: Orthopedics;  Laterality: Right;  . I&D EXTREMITY Right 03/30/2018   Procedure: IRRIGATION AND DEBRIDEMENT RIGHT LEG, APPLY WOUND VAC;  Surgeon: [name], MD;  Location: [place];  Service: Orthopedics;  Laterality: Right;  . renal calculi  12/1997   SVT with induction of anesthesia  . right knee arthroscopy    . SKIN SPLIT GRAFT Right 03/04/2018   Procedure: POSSIBLE SPLIT THICKNESS SKIN GRAFT;  Surgeon: [name], MD;  Location: [place];  Service: Orthopedics;  Laterality: Right;  . SKIN SPLIT GRAFT Right 04/01/2018   Procedure: REPEAT IRRIGATION AND DEBRIDEMENT RIGHT LEG, APPLY SPLIT THICKNESS SKIN GRAFT;  Surgeon: [name], MD;  Location: [place];  Service: Orthopedics;  Laterality: Right;  . TONSILLECTOMY AND ADENOIDECTOMY      Family History  Problem Relation Age of Onset  . Colon cancer Mother   . Prostate cancer Father   . Colon cancer Father   . Diabetes Maternal Aunt    . Breast cancer Maternal Aunt   . Diabetes Maternal Uncle   . Diabetes Paternal Aunt   . Stroke Paternal Aunt        > 65  . Heart disease Paternal Aunt   . Diabetes Paternal Uncle   . Breast cancer Maternal Aunt         X 2  . Breast cancer Cousin    Social History:  reports that she has never smoked. She has never used smokeless tobacco. She reports that she does not drink alcohol or use drugs.  Allergies:  Allergies  Allergen Reactions  . Penicillins Rash and Other (See Comments)    She was told not to take it anymore. Has patient had a PCN reaction causing immediate rash, facial/tongue/throat swelling, SOB or lightheadedness with hypotension: Yes Has patient had a PCN reaction causing severe rash involving mucus membranes or skin necrosis: No Has patient had a PCN reaction that required hospitalization: No Has patient had a PCN reaction occurring within the last 10 years: No If all of the above answers are "NO", then may proceed with Cephalosporin use.'  . Shellfish Allergy Anaphylaxis  . Sulfonamide Derivatives Anaphylaxis and Other (See Comments)    REACTION: internal "burning"  . Hydrochlorothiazide W-Triamterene Other (See Comments)    Hypokalemia  . Lotensin [Benazepril Hcl] Hives  . Dipyridamole Other (See Comments)    Unknown reaction  . Estrogens Other (See Comments)    Unknown reaction  . Hydrochlorothiazide Other (See Comments)    Unknown reaction  . Metronidazole Other (See Comments)    Unknown reaction  . Spironolactone Other (See Comments)    UNSPECIFIED > "kidney problems"  . Sulfa Antibiotics Other (See Comments)    Unknown reaction  . Torsemide Other (See Comments)    Unknown reaction  . Valsartan Other (See Comments)    Unknown reaction  . Madelaine Bhat Isothiocyanate] Itching    Medications Prior to Admission  Medication Sig Dispense Refill  . albuterol (PROVENTIL HFA) 108 (90 Base) MCG/ACT inhaler Inhale 2 puffs into the lungs every 4 (four)  hours as needed for wheezing or shortness of breath. 1 Inhaler 4  . albuterol (PROVENTIL) (2.5 MG/3ML) 0.083% nebulizer solution Take 3 mLs (2.5 mg total) by nebulization every 6 (six) hours as needed for wheezing or shortness of breath. 75 mL 12  . Amino Acids-Protein Hydrolys (FEEDING SUPPLEMENT, PRO-STAT SUGAR FREE 64,) LIQD Take 30 mLs by mouth 3 (three) times daily.    Marland Kitchen aspirin (ECOTRIN LOW STRENGTH) 81 MG EC tablet Take 81 mg by mouth at bedtime.     . betamethasone dipropionate (DIPROLENE) 0.05 % cream Apply 1 application topically 2 (two) times daily as needed for  rash.    . budesonide-formoterol (SYMBICORT) 160-4.5 MCG/ACT inhaler one - two inhalations every 12 hours; gargle and spit after use (Patient taking differently: Inhale 2 puffs into the lungs 2 (two) times daily as needed (shortness of breath). gargle and spit after use) 1 Inhaler 4  . calcium carbonate (TUMS - DOSED IN MG ELEMENTAL CALCIUM) 500 MG chewable tablet Chew 2 tablets by mouth daily as needed for indigestion or heartburn.    . carvedilol (COREG) 25 MG tablet TAKE 1 TABLET BY MOUTH TWICE DAILY WITH A MEAL (Patient taking differently: Take 25 mg by mouth 2 (two) times daily with a meal. ) 60 tablet 5  . COD LIVER OIL PO Take 1 capsule by mouth daily.    . cyanocobalamin (,VITAMIN B-12,) 1000 MCG/ML injection INJECT 1ML INTO MUSCLE EVERY 30 DAYS (Patient taking differently: Inject 1,000 mcg into the muscle every 30 (thirty) days. ) 10 mL 0  . diclofenac sodium (VOLTAREN) 1 % GEL Apply 2 g topically 4 (four) times daily. (Patient taking differently: Apply 2 g topically 4 (four) times daily as needed (pain). ) 100 g 11  . diphenhydrAMINE (BENADRYL) 12.5 MG/5ML liquid Take 25 mg by mouth daily as needed for itching.    . docusate sodium (COLACE) 100 MG capsule Take 1 capsule (100 mg total) by mouth 2 (two) times daily. (Patient taking differently: Take 100 mg by mouth 2 (two) times daily as needed for mild constipation. ) 10  capsule 0  . famotidine (PEPCID) 20 MG tablet Take 1 tablet (20 mg total) by mouth 2 (two) times daily. 180 tablet 3  . Flaxseed, Linseed, (FLAX SEEDS PO) Take 1 capsule by mouth 2 (two) times a day.    . folic acid (FOLVITE) 683 MCG tablet Take 400 mcg by mouth at bedtime.     . furosemide (LASIX) 40 MG tablet Take 3 tablets (120 mg total) by mouth 2 (two) times daily. 360 tablet 5  . hydrOXYzine (ATARAX/VISTARIL) 25 MG tablet Take 25 mg by mouth every 6 (six) hours as needed for itching.    . Multiple Vitamin (MULTIVITAMIN WITH MINERALS) TABS tablet Take 1 tablet by mouth daily.    Marland Kitchen nystatin cream (MYCOSTATIN) Apply 1 application topically 2 (two) times daily. (Patient taking differently: Apply 1 application topically 2 (two) times daily as needed (yeast). ) 30 g 2  . Omega-3 Fatty Acids (FISH OIL) 1000 MG CAPS Take 1,000 mg by mouth daily.     . OXcarbazepine (TRILEPTAL) 150 MG tablet Take 1 tablet (150 mg total) by mouth 2 (two) times daily. 60 tablet 11  . oxyCODONE-acetaminophen (PERCOCET/ROXICET) 5-325 MG tablet Take 1 tablet by mouth every 4 (four) hours as needed. (Patient taking differently: Take 1 tablet by mouth every 4 (four) hours as needed for moderate pain. ) 30 tablet 0  . potassium chloride SA (K-DUR,KLOR-CON) 20 MEQ tablet TAKE 3 TABLETS BY MOUTH DAILY. (Patient taking differently: Take 60 mEq by mouth daily. ) 270 tablet 1  . simvastatin (ZOCOR) 40 MG tablet Take 1 tablet (40 mg total) by mouth at bedtime. 90 tablet 1  . spironolactone (ALDACTONE) 25 MG tablet TAKE 1 TABLET BY MOUTH ONCE DAILY (Patient taking differently: Take 25 mg by mouth daily. ) 90 tablet 1  . traMADol (ULTRAM) 50 MG tablet Take 50-100 mg by mouth 2 (two) times daily as needed for severe pain.    . ciprofloxacin (CIPRO) 500 MG tablet Take 1 tablet (500 mg total) by mouth 2 (two)  times daily. (Patient not taking: Reported on 05/25/2018) 20 tablet 0  . Lancets (ONETOUCH ULTRASOFT) lancets Use to help check  blood sugars twice a day Dx E11.9 100 each 3  . NEEDLE, DISP, 23 G (EASY TOUCH FLIPLOCK NEEDLES) 23G X 1-1/2" MISC Use to inject B12 once monthly. 5 each 0  . polyethylene glycol (MIRALAX / GLYCOLAX) packet Take 17 g by mouth daily as needed for mild constipation. (Patient not taking: Reported on 05/25/2018) 14 each 0  . SYRINGE-NEEDLE, DISP, 3 ML 23G X 1" 3 ML MISC Use with B12 to inject IM every 30 days 50 each 0    Results for orders placed or performed during the hospital encounter of 05/27/18 (from the past 48 hour(s))  Glucose, capillary     Status: Abnormal   Collection Time: 05/27/18  5:59 AM  Result Value Ref Range   Glucose-Capillary 123 (H) 70 - 99 mg/dL  CBC     Status: Abnormal   Collection Time: 05/27/18  6:09 AM  Result Value Ref Range   WBC 5.2 4.0 - 10.5 K/uL   RBC 4.03 3.87 - 5.11 MIL/uL   Hemoglobin 11.4 (L) 12.0 - 15.0 g/dL   HCT 37.6 36.0 - 46.0 %   MCV 93.3 80.0 - 100.0 fL   MCH 28.3 26.0 - 34.0 pg   MCHC 30.3 30.0 - 36.0 g/dL   RDW 15.4 11.5 - 15.5 %   Platelets 179 150 - 400 K/uL   nRBC 0.0 0.0 - 0.2 %    Comment: Performed at [facility], [place] [address] [place], [place] [address]   No results found.  Review of Systems  All other systems reviewed and are negative.   Blood pressure 127/64, pulse 83, temperature 98.4 F (36.9 C), temperature source Oral, resp. rate 20, height 5\' 4"  (1.626 m), weight (!) 163.3 kg, SpO2 95 %. Physical Exam  Constitutional: She is oriented to person, place, and time. She appears well-developed and well-nourished. No distress.  HENT:  Head: Normocephalic and atraumatic.  Neck: No tracheal deviation present. No thyromegaly present.  Cardiovascular: Normal rate.  Respiratory: Effort normal. No stridor. No respiratory distress.  GI: Soft. She exhibits no distension.  Musculoskeletal:     Comments: The right lower extremity ulcer has biofilm present over the wound bed and foul odor today.  A moderate amount of yellow  drainage was debrided from the wound bed with gauze.  There is no periwound irritation or signs of cellulitis in the leg.  Her edema is improved where the compression was actually applied but there is a large amount of edema above the areas of compression.  She has good palpable pedal pulses.  Neurological: She is alert and oriented to person, place, and time.  Skin: Skin is warm.  Psychiatric: She has a normal mood and affect. Her behavior is normal. Judgment and thought content normal.     Assessment/Plan Chronic venous ulcer right lower leg- Plan split thickness skin graft and placement of VAC dressing. The procedure, benefits and risks were discussed with the patient including the risks of bleeding, infection, neurovascular injury and possible need for further surgery were discussed with the patient and questions answered to her satisfaction. She desires to proceed with surgery at this time.   Erlinda Hong, PA-C 05/27/2018, 6:58 AM  [place]

## 2018-05-27 NOTE — Op Note (Signed)
05/27/2018  8:31 AM  PATIENT:  April [name]    PRE-OPERATIVE DIAGNOSIS:  Chronic Ulcer Right Lower Leg  POST-OPERATIVE DIAGNOSIS:  Same  PROCEDURE:  RIGHT LOWER LEG DEBRIDEMENT,  SKIN GRAFT, VAC PLACEMENT Wound bed preparation 100 cm. Application of Integra Prograft 8 x 10 cm sheet  SURGEON:  [name], MD  PHYSICIAN ASSISTANT:None ANESTHESIA:   General  PREOPERATIVE INDICATIONS:  [name] is a  68 y.o. female with a diagnosis of Chronic Ulcer Right Lower Leg who failed conservative measures and elected for surgical management.    The risks benefits and alternatives were discussed with the patient preoperatively including but not limited to the risks of infection, bleeding, nerve injury, cardiopulmonary complications, the need for revision surgery, among others, and the patient was willing to proceed.  OPERATIVE IMPLANTS: Integra Prograft 8 x 10 cm.  @ENCIMAGES @  OPERATIVE FINDINGS: Good granulation tissue at the wound bed.  Tissue was sent for cultures for biofilm.  OPERATIVE PROCEDURE: Patient was brought the operating room and underwent a general anesthetic.  After adequate levels anesthesia were obtained patient's right lower extremity was prepped using DuraPrep draped into a sterile field a timeout was called.  A 21 blade knife was used to excise the biofilm from the superficial aspect of the wound.  This left a wound bed that was 10 x 10 cm.  The tissue was sent for cultures.  The wound was irrigated with normal saline.  The Integra Prograf was applied and secured with staples.  The cleanse choice wound VAC was applied this had a good suction fit patient's leg was overwrapped with Covan to help decrease swelling from her venous insufficiency.  Continue with clindamycin patient was extubated taken the PACU in stable condition.   DISCHARGE PLANNING:  Antibiotic duration: Continue clindamycin until wound cultures are negative.  Weightbearing: Weightbearing as  tolerated  Pain medication: Opioid pathway  Dressing care/ Wound VAC: Continue wound VAC for 1 week  Ambulatory devices: Walker  Discharge to: Anticipate discharge to home  Follow-up: In the office 1 week post operative.

## 2018-05-27 NOTE — Anesthesia Procedure Notes (Signed)
Procedure Name: Intubation Date/Time: 05/27/2018 7:50 AM Performed by: Alain Marion, CRNA Pre-anesthesia Checklist: Patient identified, Emergency Drugs available, Suction available and Patient being monitored Patient Re-evaluated:Patient Re-evaluated prior to induction Oxygen Delivery Method: Circle System Utilized Preoxygenation: Pre-oxygenation with 100% oxygen Induction Type: IV induction and Rapid sequence Laryngoscope Size: Miller and 2 Grade View: Grade I Tube type: Oral Tube size: 7.0 mm Number of attempts: 1 Airway Equipment and Method: Stylet and Oral airway Placement Confirmation: ETT inserted through vocal cords under direct vision,  positive ETCO2 and breath sounds checked- equal and bilateral Secured at: 21 cm Tube secured with: Tape Dental Injury: Teeth and Oropharynx as per pre-operative assessment

## 2018-05-27 NOTE — Progress Notes (Signed)
Pt admitted to 6N30 from PACU.  Oriented to room and dept, IS instruction done.

## 2018-05-27 NOTE — Transfer of Care (Signed)
Immediate Anesthesia Transfer of Care Note  Patient: April [name]  Procedure(s) Performed: RIGHT LOWER LEG DEBRIDEMENT,  SKIN GRAFT, VAC PLACEMENT (Right )  Patient Location: PACU  Anesthesia Type:General  Level of Consciousness: awake, alert  and oriented  Airway & Oxygen Therapy: Patient Spontanous Breathing and Patient connected to face mask oxygen  Post-op Assessment: Report given to RN and Post -op Vital signs reviewed and stable  Post vital signs: Reviewed and stable  Last Vitals:  Vitals Value Taken Time  BP 122/52 05/27/2018  8:30 AM  Temp    Pulse 86 05/27/2018  8:31 AM  Resp 19 05/27/2018  8:31 AM  SpO2 96 % 05/27/2018  8:31 AM  Vitals shown include unvalidated device data.  Last Pain:  Vitals:   05/27/18 0606  TempSrc:   PainSc: 9       Patients Stated Pain Goal: 3 (85/46/27 0350)  Complications: No apparent anesthesia complications

## 2018-05-28 LAB — GLUCOSE, CAPILLARY
Glucose-Capillary: 105 mg/dL — ABNORMAL HIGH (ref 70–99)
Glucose-Capillary: 121 mg/dL — ABNORMAL HIGH (ref 70–99)
Glucose-Capillary: 86 mg/dL (ref 70–99)
Glucose-Capillary: 118 mg/dL — ABNORMAL HIGH (ref 70–99)

## 2018-05-28 MED ORDER — WHITE PETROLATUM EX OINT
TOPICAL_OINTMENT | CUTANEOUS | Status: AC
  Administered 2018-05-28: 0.2
  Filled 2018-05-28: qty 28.35

## 2018-05-28 NOTE — Evaluation (Signed)
Physical Therapy Evaluation Patient Details Name: April [name] MRN: [id number] DOB: 02/24/1950 Today's Date: 05/28/2018   History of Present Illness  Pt is a 68 y.o. F with significant PMH of OA, morbid obesity, DM, CKD 3 who presents with chronic ulcer right lower leg s/p repeat debridement and skin graft placement with VAC placement  Clinical Impression  Pt presents with above. Prior to admission, pt requires assist for ADL's and is a limited ambulator using a walker. On PT evaluation, pt presents with decreased functional mobility secondary to pain, weakness, obesity, and decreased activity tolerance. Performing bed mobility with two person moderate assistance and transferring to standing with maximal assistance. Able to take some side steps at the edge of the bed. Further gait deferred due to urinary frequency. Pt will benefit from continued HHPT and for acute PT to progress mobility as tolerated.    Follow Up Recommendations Home health PT;Supervision for mobility/OOB (declining SNF)    Equipment Recommendations  None recommended by PT    Recommendations for Other Services       Precautions / Restrictions Precautions Precautions: Fall Precaution Comments: wound vac Restrictions Weight Bearing Restrictions: No      Mobility  Bed Mobility Overal bed mobility: Needs Assistance Bed Mobility: Sit to Supine;Supine to Sit     Supine to sit: Mod assist;+2 for physical assistance Sit to supine: Mod assist;+2 for physical assistance   General bed mobility comments: Some assist for negotiating BLE's towards edge of bed and trunk elevation assist  Transfers Overall transfer level: Needs assistance Equipment used: Rolling walker (2 wheeled) Transfers: Sit to/from Stand Sit to Stand: Max assist;+2 safety/equipment         General transfer comment: MaxA to achieve upright to standing edge of bed, pt limited by pain. significant time and effort. Able to take side steps up to edge  of bed  Ambulation/Gait                Stairs            Wheelchair Mobility    Modified Rankin (Stroke Patients Only)       Balance Overall balance assessment: Needs assistance Sitting-balance support: Feet supported Sitting balance-Leahy Scale: Fair     Standing balance support: Bilateral upper extremity supported Standing balance-Leahy Scale: Poor Standing balance comment: reliant on UE support                             Pertinent Vitals/Pain Pain Assessment: Faces Faces Pain Scale: Hurts even more Pain Location: RLE Pain Descriptors / Indicators: Grimacing;Operative site guarding Pain Intervention(s): Limited activity within patient's tolerance;Monitored during session    [place] expects to be discharged to:: Private residence Living Arrangements: Spouse/significant other Available Help at Discharge: Family;Available PRN/intermittently Type of Home: House Home Access: Stairs to enter Entrance Stairs-Rails: Left Entrance Stairs-Number of Steps: 4 Home Layout: One level Home Equipment: Walker - 2 wheels;Cane - quad;Wheelchair - manual      Prior Function Level of Independence: Needs assistance   Gait / Transfers Assistance Needed: limited household ambulator using walker. pt husband helps her out of bed and she mainly sleeps in her recliner  ADL's / Homemaking Assistance Needed: requires assist with ADLs        Hand Dominance   Dominant Hand: Right    Extremity/Trunk Assessment   Upper Extremity Assessment Upper Extremity Assessment: Generalized weakness    Lower Extremity Assessment Lower Extremity Assessment:  Generalized weakness    Cervical / Trunk Assessment Cervical / Trunk Assessment: Other exceptions Cervical / Trunk Exceptions: increased body habitus  Communication   Communication: No difficulties  Cognition Arousal/Alertness: Awake/alert Behavior During Therapy: WFL for tasks  assessed/performed Overall Cognitive Status: Within Functional Limits for tasks assessed                                        General Comments      Exercises     Assessment/Plan    PT Assessment Patient needs continued PT services  PT Problem List Decreased strength;Decreased activity tolerance;Decreased balance;Decreased mobility;Obesity;Pain;Decreased skin integrity       PT Treatment Interventions DME instruction;Gait training;Functional mobility training;Therapeutic activities;Therapeutic exercise;Balance training;Patient/family education    PT Goals (Current goals can be found in the Care Plan section)  Acute Rehab PT Goals Patient Stated Goal: "go home." PT Goal Formulation: With patient Time For Goal Achievement: 06/11/18 Potential to Achieve Goals: Fair    Frequency Min 3X/week   Barriers to discharge        Co-evaluation               AM-PAC PT "6 Clicks" Mobility  Outcome Measure Help needed turning from your back to your side while in a flat bed without using bedrails?: A Lot Help needed moving from lying on your back to sitting on the side of a flat bed without using bedrails?: A Lot Help needed moving to and from a bed to a chair (including a wheelchair)?: A Lot Help needed standing up from a chair using your arms (e.g., wheelchair or bedside chair)?: Total Help needed to walk in hospital room?: A Lot Help needed climbing 3-5 steps with a railing? : Total 6 Click Score: 10    End of Session   Activity Tolerance: Patient limited by pain Patient left: in bed;with call bell/phone within reach;with nursing/sitter in room Nurse Communication: Mobility status PT Visit Diagnosis: Other abnormalities of gait and mobility (R26.89);Muscle weakness (generalized) (M62.81);Difficulty in walking, not elsewhere classified (R26.2);Pain Pain - Right/Left: Right Pain - part of body: Leg    Time: 0927-0959 PT Time Calculation (min) (ACUTE  ONLY): 32 min   Charges:   PT Evaluation $PT Eval Moderate Complexity: 1 Mod PT Treatments $Therapeutic Activity: 8-22 mins       Ellamae Sia, PT, DPT Acute Rehabilitation Services Pager [phone] Office [phone]  Willy Eddy 05/28/2018, 3:32 PM

## 2018-05-28 NOTE — Progress Notes (Signed)
Patient ID: April [name], female   DOB: [birth year], 68 y.o.   MRN: [id number] Patient is postoperative day 1 excision of the biofilm right leg application of Integra skin graft and wound VAC.  There is 50 cc in the wound VAC canister 1 check.  Cultures show gram-positive rods and gram-positive cocci.  She is on clindamycin.  Will change the antibiotics according to the cultures.

## 2018-05-29 LAB — GLUCOSE, CAPILLARY
Glucose-Capillary: 102 mg/dL — ABNORMAL HIGH (ref 70–99)
Glucose-Capillary: 104 mg/dL — ABNORMAL HIGH (ref 70–99)
Glucose-Capillary: 122 mg/dL — ABNORMAL HIGH (ref 70–99)

## 2018-05-29 NOTE — Plan of Care (Signed)
  Problem: Coping: Goal: Level of anxiety will decrease Outcome: Progressing   Problem: Pain Managment: Goal: General experience of comfort will improve Outcome: Progressing   Problem: Safety: Goal: Ability to remain free from injury will improve Outcome: Progressing   Problem: Skin Integrity: Goal: Risk for impaired skin integrity will decrease Outcome: Progressing   

## 2018-05-30 ENCOUNTER — Encounter (HOSPITAL_COMMUNITY): Payer: Self-pay | Admitting: Orthopedic Surgery

## 2018-05-30 LAB — URINALYSIS, ROUTINE W REFLEX MICROSCOPIC
Bilirubin Urine: NEGATIVE
Glucose, UA: NEGATIVE mg/dL
Hgb urine dipstick: NEGATIVE
Ketones, ur: NEGATIVE mg/dL
Leukocytes,Ua: NEGATIVE
Nitrite: NEGATIVE
Protein, ur: NEGATIVE mg/dL
Specific Gravity, Urine: 1.013 (ref 1.005–1.030)
pH: 5 (ref 5.0–8.0)

## 2018-05-30 LAB — GLUCOSE, CAPILLARY
Glucose-Capillary: 107 mg/dL — ABNORMAL HIGH (ref 70–99)
Glucose-Capillary: 117 mg/dL — ABNORMAL HIGH (ref 70–99)
Glucose-Capillary: 175 mg/dL — ABNORMAL HIGH (ref 70–99)
Glucose-Capillary: 127 mg/dL — ABNORMAL HIGH (ref 70–99)
Glucose-Capillary: 97 mg/dL (ref 70–99)

## 2018-05-30 MED ORDER — VANCOMYCIN HCL 10 G IV SOLR
2250.0000 mg | INTRAVENOUS | Status: DC
  Administered 2018-06-01: 2250 mg via INTRAVENOUS
  Filled 2018-05-30: qty 2250

## 2018-05-30 MED ORDER — VANCOMYCIN HCL 10 G IV SOLR
2500.0000 mg | Freq: Once | INTRAVENOUS | Status: AC
  Administered 2018-05-30: 2500 mg via INTRAVENOUS
  Filled 2018-05-30: qty 2500

## 2018-05-30 MED ORDER — ADULT MULTIVITAMIN W/MINERALS CH
1.0000 | ORAL_TABLET | Freq: Every day | ORAL | Status: DC
  Administered 2018-05-30 – 2018-06-03 (×5): 1 via ORAL
  Filled 2018-05-30 (×4): qty 1

## 2018-05-30 NOTE — Progress Notes (Addendum)
PT Cancellation Note  Patient Details Name: April [name] MRN: [id number] DOB: 1950-06-21   Cancelled Treatment:    Reason Eval/Treat Not Completed: Other (comment)(pt needs bariatric recliner, I paged portable equipment twice to request delivery. No response after 15 minutes.  Nurse tech stated she'd assist pt up to recliner once it arrives. Will follow. )  Philomena Doheny PT 05/30/2018  Acute Rehabilitation Services Pager [phone] Office [phone]

## 2018-05-30 NOTE — Progress Notes (Signed)
Patient ID: April [name], female   DOB: [birth year], 68 y.o.   MRN: [id number] Patient is status post split-thickness skin graft.  There is 150 cc in the wound VAC canister there was one check.  Patient states she does have burning with urination we will check a urinalysis.  Her wound cultures are showing staph sensitivities pending she is on clindamycin.  Will discharge when cultures are finalized.

## 2018-05-30 NOTE — Progress Notes (Signed)
Pharmacy Antibiotic Note  April [name] is a 68 y.o. female admitted on 05/27/2018 with cellulitis of chronic RLE ulcer.  Underwent debridement of wound, skin graft, VAC placement 4/24. WoundCx growing abundant MRSA. Pharmacy has been consulted for Vancomycin dosing.  Plan: Consider d/c Clindamycin  Vancomycin 2500mg  x1, then 2250mg  q48hr  Height: 5\' 4"  (162.6 cm) Weight: (!) 360 lb 0.2 oz (163.3 kg) IBW/kg (Calculated) : 54.7  Temp (24hrs), Avg:98.5 F (36.9 C), Min:98.5 F (36.9 C), Max:98.5 F (36.9 C)  Recent Labs  Lab 05/27/18 0609  WBC 5.2  CREATININE 1.74*    Estimated Creatinine Clearance: 48.6 mL/min (A) (by C-G formula based on SCr of 1.74 mg/dL (H)).    Allergies  Allergen Reactions  . Penicillins Rash and Other (See Comments)    She was told not to take it anymore. Has patient had a PCN reaction causing immediate rash, facial/tongue/throat swelling, SOB or lightheadedness with hypotension: Yes Has patient had a PCN reaction causing severe rash involving mucus membranes or skin necrosis: No Has patient had a PCN reaction that required hospitalization: No Has patient had a PCN reaction occurring within the last 10 years: No If all of the above answers are "NO", then may proceed with Cephalosporin use.'  . Shellfish Allergy Anaphylaxis  . Sulfonamide Derivatives Anaphylaxis and Other (See Comments)    REACTION: internal "burning"  . Hydrochlorothiazide W-Triamterene Other (See Comments)    Hypokalemia  . Lotensin [Benazepril Hcl] Hives  . Dipyridamole Other (See Comments)    Unknown reaction  . Estrogens Other (See Comments)    Unknown reaction  . Hydrochlorothiazide Other (See Comments)    Unknown reaction  . Metronidazole Other (See Comments)    Unknown reaction  . Spironolactone Other (See Comments)    UNSPECIFIED > "kidney problems"  . Sulfa Antibiotics Other (See Comments)    Unknown reaction  . Torsemide Other (See Comments)    Unknown reaction  .  Valsartan Other (See Comments)    Unknown reaction  . Madelaine Bhat Isothiocyanate] Itching    Antimicrobials this admission: 4/24 Clindamycin >>  4/27 Vanc >>   Dose adjustments this admission:  Microbiology results: 4/24 WoundCx: abundant MRSA  Thank you for allowing pharmacy to be a part of this patient's care.  Minda Ditto 05/30/2018 2:27 PM  [phone] till 3p

## 2018-05-30 NOTE — Progress Notes (Signed)
VAST RN consulted to place PIV for abx. Upon arrival at bedside, discussed with unit RN, Brittney that physician stated in his am note that patient will be dc'd upon finalized cultures, which should be today. Unit RN to contact MD to determine if IV and IV abx need to continue. Unit RN notified VAST RN that physician wishes patient to get another IV and 1400 dose of IV abx.

## 2018-05-30 NOTE — TOC Initial Note (Addendum)
Transition of Care [facility]) - Initial/Assessment Note    Patient Details  Name: April [name] MRN: [id number] Date of Birth: Jun 30, 1950  [facility]) CM/SW Contact:    Marilu Favre, RN Phone Number: 05/30/2018, 2:13 PM  Clinical Narrative:                 Patient from home with husband and son. Active with [facility]. Oswald Hillock with [facility] aware patient is admitted to hospital. Will need resumption of care orders for HHRN,PT,OT  Has walker and 3 in 1 at home   Expected Discharge Plan: [place] Barriers to Discharge: Continued Medical Work up   Patient Goals and CMS Choice Patient states their goals for this hospitalization and ongoing recovery are:: to go home  CMS Medicare.gov Compare Post Acute Care list provided to:: Patient Choice offered to / list presented to : Patient  Expected Discharge Plan and Services Expected Discharge Plan: [place]   Discharge Planning Services: CM Consult Post Acute Care Choice: [place] arrangements for the past 2 months: Single Family Home                 DME Arranged: N/A DME Agency: NA         HH Agency: [facility]        Prior Living Arrangements/Services Living arrangements for the past 2 months: [place] Lives with:: Spouse Patient language and need for interpreter reviewed:: Yes Do you feel safe going back to the place where you live?: Yes      Need for Family Participation in Patient Care: Yes (Comment) Care giver support system in place?: Yes (comment) Current home services: DME, Home PT, Home OT, Home RN Criminal Activity/Legal Involvement Pertinent to Current Situation/Hospitalization: No - Comment as needed  Activities of Daily Living Home Assistive Devices/Equipment: Eyeglasses, Environmental consultant (specify type), Cane (specify quad or straight), Wheelchair ADL Screening (condition at time of admission) Patient's cognitive  ability adequate to safely complete daily activities?: Yes Is the patient deaf or have difficulty hearing?: No Does the patient have difficulty seeing, even when wearing glasses/contacts?: No Does the patient have difficulty concentrating, remembering, or making decisions?: No Patient able to express need for assistance with ADLs?: Yes Does the patient have difficulty dressing or bathing?: No Independently performs ADLs?: Yes (appropriate for developmental age) Does the patient have difficulty walking or climbing stairs?: Yes Weakness of Legs: Right Weakness of Arms/Hands: None  Permission Sought/Granted   Permission granted to share information with : Yes, Verbal Permission Granted     Permission granted to share info w AGENCY: [facility]        Emotional Assessment Appearance:: Appears stated age Attitude/Demeanor/Rapport: Engaged Affect (typically observed): Accepting Orientation: : Oriented to Self, Oriented to Place, Oriented to  Time, Oriented to Situation   Psych Involvement: No (comment)  Admission diagnosis:  Chronic Ulcer Right Lower Leg Patient Active Problem List   Diagnosis Date Noted  . Onychomycosis 05/19/2018  . Idiopathic chronic venous hypertension of right lower extremity with ulcer and inflammation ([place]) 05/19/2018  . Traumatic wound dehiscence   . Leg wound, right, sequela 02/18/2018  . Blister 02/18/2018  . Cellulitis of right leg 01/14/2018  . Skin lumps 01/14/2018  . Bilateral leg cramps 12/14/2017  . Dry skin dermatitis 06/29/2017  . Left shoulder pain 08/14/2016  . Meralgia paresthetica 07/16/2016  . Chronic left-sided low back pain with left-sided sciatica 06/02/2016  .  Venous stasis ulcer ([place]) 05/13/2016  . Abscess of right lower leg 03/28/2016  . Whole body pain 03/20/2016  . Diabetic peripheral neuropathy ([place]) 03/20/2016  . Osteopenia 01/11/2016  . CKD (chronic kidney disease) stage 3, GFR 30-59 ml/min (HCC) 01/02/2016  . Hand paresthesia  07/18/2015  . Carpal tunnel syndrome 06/07/2015  . Family history of colon cancer   . Diabetes mellitus with neurological manifestations ([place]) 04/18/2015  . Bilateral leg edema 04/11/2015  . Cellulitis of leg, right 04/11/2015  . Abnormality of gait 01/03/2015  . Hereditary and idiopathic peripheral neuropathy 01/03/2015  . GERD (gastroesophageal reflux disease) 06/17/2014  . Morbid obesity with BMI of 50.0-59.9, adult ([place]) 09/05/2013  . Hx of colonic polyps 12/15/2012  . Vitamin B12 deficiency 11/03/2012  . Intrinsic asthma 03/23/2012  . Chronic diastolic heart failure ([place]) 02/20/2011  . OSA (obstructive sleep apnea) 09/17/2010  . URINARY URGENCY 01/08/2010  . CAD, NATIVE VESSEL 11/20/2008  . OSTEOARTHRITIS, KNEES, BILATERAL, SEVERE 06/14/2008  . Hyperlipidemia 05/10/2007  . Essential hypertension 01/18/2007  . HYPOKALEMIA 04/30/2006  . Body mass index 60.0-69.9, adult ([place]) 04/30/2006  . HX, PERSONAL, PEPTIC ULCER DISEASE 04/30/2006   PCP:  [name], MD Pharmacy:   [facility] [place], [place] - [address] AT [address] [place] [address] Phone: [phone] Fax: [phone]     Social Determinants of Health (SDOH) Interventions    Readmission Risk Interventions No flowsheet data found.

## 2018-05-30 NOTE — Progress Notes (Signed)
Called Dr. [name] about wound culture positive for MRSA, Dr. [name] gave RN a verbal order for vancomycin per pharmacy. RN entered order. Contact precautions in place.

## 2018-05-30 NOTE — Progress Notes (Signed)
Initial Nutrition Assessment  RD working remotely.  DOCUMENTATION CODES:   Morbid obesity  INTERVENTION:   -Continue 30 ml Prostat TID, each supplement provides 100 kcals and 15 grams protein -MVI with minerals daily  NUTRITION DIAGNOSIS:   Increased nutrient needs related to post-op healing as evidenced by estimated needs.  GOAL:   Patient will meet greater than or equal to 90% of their needs  MONITOR:   PO intake, Supplement acceptance, Labs, Weight trends, Skin, I & O's  REASON FOR ASSESSMENT:   Other (Comment)    ASSESSMENT:   The patient is a 68 year old woman who has undergone several debridements and skin graft placement  of her  very large venous ulcer of the right lower extremity.  She was placed in a medical compression stocking but reports that the stockings kept rolling down and she had the nurse apply a multilayer compression wrap but these have also slid down over the past week.  She has had a lot of increased drainage from the residual ulcer and there is evidence of biofilm over the area today.  There is also odor over the area. She presents for repeat debridement and skin graft placement with VAC placement today.   Pt admitted with chronic rt lower leg venous stasis ulcer.   4/24- s/p rt lower leg debridement, skin graft, vac placement 4/25- wound cultures reveal gram-positive rods and gram-positive cocci  Reviewed I/O's: +806 ml x 24 hours and -1.4 L since admission  UOP: 2.1 L x 24 hours  Drain output: 150 ml x 24 hours  Pt with good appetite; noted meal completion 75-100%. Pt is compliant with Prostat supplements.   Reviewed wt hx; noted pt has experienced a 4.5% wt loss over the past 3 months, which is not significant for time frame and favorable given morbid obesity.  Per PT notes, recommending home health services as pt is refusing SNF.   Lab Results  Component Value Date   HGBA1C 6.1 (H) 05/27/2018   PTA DM medications are none.   Labs  reviewed: CBGS: 104-122 (inpatient orders for glycemic control are 0-20 units insulin aspart TID with meals and 6 units insulin aspart TID with meals).   NUTRITION - FOCUSED PHYSICAL EXAM:    Most Recent Value  Orbital Region  Unable to assess  Upper Arm Region  Unable to assess  Thoracic and Lumbar Region  Unable to assess  Buccal Region  Unable to assess  Temple Region  Unable to assess  Clavicle Bone Region  Unable to assess  Clavicle and Acromion Bone Region  Unable to assess  Scapular Bone Region  Unable to assess  Dorsal Hand  Unable to assess  Patellar Region  Unable to assess  Anterior Thigh Region  Unable to assess  Posterior Calf Region  Unable to assess  Edema (RD Assessment)  Unable to assess  Hair  Unable to assess  Eyes  Unable to assess  Mouth  Unable to assess  Skin  Unable to assess  Nails  Unable to assess       Diet Order:   Diet Order            Diet Carb Modified Fluid consistency: Thin; Room service appropriate? Yes  Diet effective now              EDUCATION NEEDS:   No education needs have been identified at this time  Skin:  Skin Assessment: Skin Integrity Issues: Skin Integrity Issues:: Wound VAC Wound Vac: rt leg  Last BM:  05/27/18  Height:   Ht Readings from Last 1 Encounters:  05/27/18 5\' 4"  (1.626 m)    Weight:   Wt Readings from Last 1 Encounters:  05/27/18 (!) 163.3 kg    Ideal Body Weight:  54.5 kg  BMI:  Body mass index is 61.8 kg/m.  Estimated Nutritional Needs:   Kcal:  1800-2000  Protein:  120-135 grams  Fluid:  > 1.8 L    Brason Berthelot A. Jimmye Norman, RD, LDN, [place] Registered Dietitian II Certified Diabetes Care and Education Specialist Pager: [phone] After hours Pager: [phone]

## 2018-05-31 ENCOUNTER — Inpatient Hospital Stay: Payer: Self-pay

## 2018-05-31 ENCOUNTER — Inpatient Hospital Stay (HOSPITAL_COMMUNITY): Payer: Medicare Other

## 2018-05-31 ENCOUNTER — Ambulatory Visit: Payer: Medicare Other

## 2018-05-31 DIAGNOSIS — Z6841 Body Mass Index (BMI) 40.0 and over, adult: Secondary | ICD-10-CM

## 2018-05-31 DIAGNOSIS — M79672 Pain in left foot: Secondary | ICD-10-CM

## 2018-05-31 DIAGNOSIS — R05 Cough: Secondary | ICD-10-CM

## 2018-05-31 DIAGNOSIS — I5032 Chronic diastolic (congestive) heart failure: Secondary | ICD-10-CM

## 2018-05-31 LAB — CREATININE, SERUM
Creatinine, Ser: 1.58 mg/dL — ABNORMAL HIGH (ref 0.44–1.00)
GFR calc Af Amer: 39 mL/min — ABNORMAL LOW (ref >60)
GFR calc non Af Amer: 34 mL/min — ABNORMAL LOW (ref >60)

## 2018-05-31 LAB — GLUCOSE, CAPILLARY
Glucose-Capillary: 175 mg/dL — ABNORMAL HIGH (ref 70–99)
Glucose-Capillary: 158 mg/dL — ABNORMAL HIGH (ref 70–99)
Glucose-Capillary: 119 mg/dL — ABNORMAL HIGH (ref 70–99)
Glucose-Capillary: 130 mg/dL — ABNORMAL HIGH (ref 70–99)

## 2018-05-31 MED ORDER — AZITHROMYCIN 250 MG PO TABS
500.0000 mg | ORAL_TABLET | Freq: Every day | ORAL | Status: DC
  Administered 2018-05-31 – 2018-06-03 (×4): 500 mg via ORAL
  Filled 2018-05-31 (×4): qty 2

## 2018-05-31 MED ORDER — SODIUM CHLORIDE 0.9% FLUSH
10.0000 mL | Freq: Two times a day (BID) | INTRAVENOUS | Status: DC
  Administered 2018-06-03: 10:00:00 10 mL

## 2018-05-31 MED ORDER — SODIUM CHLORIDE 0.9% FLUSH
10.0000 mL | INTRAVENOUS | Status: DC | PRN
  Administered 2018-06-03: 10 mL
  Filled 2018-05-31: qty 40

## 2018-05-31 MED ORDER — TECHNETIUM TO 99M ALBUMIN AGGREGATED
1.7000 | Freq: Once | INTRAVENOUS | Status: AC | PRN
  Administered 2018-05-31: 1.7 via INTRAVENOUS

## 2018-05-31 MED ORDER — GUAIFENESIN ER 600 MG PO TB12
600.0000 mg | ORAL_TABLET | Freq: Two times a day (BID) | ORAL | Status: DC
  Administered 2018-05-31 – 2018-06-03 (×6): 600 mg via ORAL
  Filled 2018-05-31 (×7): qty 1

## 2018-05-31 NOTE — Progress Notes (Signed)
Orthopedic Tech Progress Note Patient Details:  April [name] 1950/12/23 [id number]  Ortho Devices Type of Ortho Device: CAM walker   Post Interventions Patient Tolerated: Well Instructions Provided: Care of device right  [name] 05/31/2018, 12:13 PM

## 2018-05-31 NOTE — Progress Notes (Signed)
Physical Therapy Treatment Patient Details Name: April [name] MRN: [id number] DOB: 06/19/1950 Today's Date: 05/31/2018    History of Present Illness Pt is a 68 y.o. F with significant PMH of OA, morbid obesity, DM, CKD 3 who presents with chronic ulcer right lower leg s/p repeat debridement and skin graft placement with VAC placement    PT Comments    Pt not progressing with mobility today due to pain L foot that she reports began on Saturday 4/25 and has been progressively getting worse to the point that she could not achieve full standing with therapy today due to 10/10 pain in Greer position. Pt c/o plantar and dorsal pain towards the toes and increased pain with plantar flexion compared to dorsiflexion. She does not want to d/c to SNF but is home alone all day. Discussed this with her today and that she would not be safe at home alone without the ability to at least transfer to a [facility] PT will continue to follow.   Follow Up Recommendations  Supervision for mobility/OOB;SNF     Equipment Recommendations  None recommended by PT    Recommendations for Other Services       Precautions / Restrictions Precautions Precautions: Fall Precaution Comments: RLE wound vac Restrictions Weight Bearing Restrictions: Yes RLE Weight Bearing: Weight bearing as tolerated LLE Weight Bearing: Weight bearing as tolerated    Mobility  Bed Mobility               General bed mobility comments: pt received in recliner  Transfers Overall transfer level: Needs assistance Equipment used: Rolling walker (2 wheeled)(bariatric RW left in room) Transfers: Sit to/from Stand Sit to Stand: Max assist;+2 safety/equipment         General transfer comment: pt c/o excruciating pain with WB'ing LLE and could achieve only partial stand from recliner before needing to return to sitting. Attempted twice. Pt even had difficulty putting wt through LLE to scoot back in chair.   Ambulation/Gait             General Gait Details: unable due to pain   Stairs             Wheelchair Mobility    Modified Rankin (Stroke Patients Only)       Balance Overall balance assessment: Needs assistance Sitting-balance support: Feet supported Sitting balance-Leahy Scale: Fair     Standing balance support: Bilateral upper extremity supported Standing balance-Leahy Scale: Poor Standing balance comment: reliant on UE support and even with it unable to achieve full standing                            Cognition Arousal/Alertness: Awake/alert Behavior During Therapy: WFL for tasks assessed/performed Overall Cognitive Status: Within Functional Limits for tasks assessed                                        Exercises General Exercises - Lower Extremity Ankle Circles/Pumps: AROM;Both;10 reps;Seated Quad Sets: AROM;Both;10 reps;Seated Long Arc Quad: AROM;Both;10 reps;Seated    General Comments General comments (skin integrity, edema, etc.): Pt reports tenderness plantar and dorsal aspect of L foot, greater towards the toes. She also reports increased pain with plantar flexion compared to df.       Pertinent Vitals/Pain Pain Assessment: 0-10 Pain Score: 10-Worst pain ever Pain Location: LLE Pain Descriptors / Indicators: Grimacing;Cramping Pain  Intervention(s): Limited activity within patient's tolerance;Monitored during session    Home Living                      Prior Function            PT Goals (current goals can now be found in the care plan section) Acute Rehab PT Goals Patient Stated Goal: "go home." PT Goal Formulation: With patient Time For Goal Achievement: 06/11/18 Potential to Achieve Goals: Fair Progress towards PT goals: Not progressing toward goals - comment(L foot pain)    Frequency    Min 3X/week      PT Plan Discharge plan needs to be updated    Co-evaluation              AM-PAC PT "6 Clicks"  Mobility   Outcome Measure  Help needed turning from your back to your side while in a flat bed without using bedrails?: A Lot Help needed moving from lying on your back to sitting on the side of a flat bed without using bedrails?: A Lot Help needed moving to and from a bed to a chair (including a wheelchair)?: Total Help needed standing up from a chair using your arms (e.g., wheelchair or bedside chair)?: Total Help needed to walk in hospital room?: Total Help needed climbing 3-5 steps with a railing? : Total 6 Click Score: 8    End of Session   Activity Tolerance: Patient limited by pain Patient left: with call bell/phone within reach;in chair Nurse Communication: Mobility status PT Visit Diagnosis: Other abnormalities of gait and mobility (R26.89);Muscle weakness (generalized) (M62.81);Difficulty in walking, not elsewhere classified (R26.2);Pain Pain - Right/Left: Left Pain - part of body: Ankle and joints of foot     Time: 1007-1219 PT Time Calculation (min) (ACUTE ONLY): 22 min  Charges:  $Therapeutic Activity: 8-22 mins                     Leighton Roach, [place]  Pager [phone] Office [place] 05/31/2018, 3:01 PM

## 2018-05-31 NOTE — Progress Notes (Signed)
Subjective: 4 Days Post-Op Procedure(s) (LRB): RIGHT LOWER LEG DEBRIDEMENT,  SKIN GRAFT, VAC PLACEMENT (Right) Patient reports pain as mild.   UA negative C/o cough and worried she is getting bronchitis.   Objective: Vital signs in last 24 hours: Temp:  [99.1 F (37.3 C)-99.3 F (37.4 C)] 99.3 F (37.4 C) (04/28 0605) Pulse Rate:  [70-74] 71 (04/28 0605) Resp:  [17] 17 (04/28 0605) BP: (106-110)/(49-65) 110/49 (04/28 0605) SpO2:  [89 %-100 %] 100 % (04/28 0605)  Intake/Output from previous day: 04/27 0701 - 04/28 0700 In: 2091.3 [P.O.:1560; I.V.:9.8; IV Piggyback:521.5] Out: 3350 [Urine:3300; Drains:50] Intake/Output this shift: No intake/output data recorded.  No results for input(s): HGB in the last 72 hours. No results for input(s): WBC, RBC, HCT, PLT in the last 72 hours. Recent Labs    05/31/18 0207  CREATININE 1.58*   No results for input(s): LABPT, INR in the last 72 hours.  VAC dressing in place over the right lower leg and VAC canister with 225 cc in canister.    Assessment/Plan: 4 Days Post-Op Procedure(s) (LRB): RIGHT LOWER LEG DEBRIDEMENT,  SKIN GRAFT, VAC PLACEMENT (Right) Operative cultures with MRSA- Will plan for IV antibiotics, Vancomycin x 3 weeks post op. Will see if can have PICC line placed.  Will plan for DC home with [facility] as patient adamant she will not go to to nursing facility.  Will ask IM to see for concerns for bronchitis.    Erlinda Hong, PA-C 05/31/2018, 8:22 AM  [employer] [phone]

## 2018-05-31 NOTE — Progress Notes (Signed)
Peripherally Inserted Central Catheter/Midline Placement  The IV Nurse has discussed with the patient and/or persons authorized to consent for the patient, the purpose of this procedure and the potential benefits and risks involved with this procedure.  The benefits include less needle sticks, lab draws from the catheter, and the patient may be discharged home with the catheter. Risks include, but not limited to, infection, bleeding, blood clot (thrombus formation), and puncture of an artery; nerve damage and irregular heartbeat and possibility to perform a PICC exchange if needed/ordered by physician.  Alternatives to this procedure were also discussed.  Bard Power PICC patient education guide, fact sheet on infection prevention and patient information card has been provided to patient /or left at bedside.    PICC/Midline Placement Documentation  PICC Single Lumen 05/31/18 PICC Right Brachial 46 cm 0 cm (Active)  Indication for Insertion or Continuance of Line Home intravenous therapies (PICC only) 05/31/2018  4:00 PM  Exposed Catheter (cm) 0 cm 05/31/2018  4:00 PM  Site Assessment Clean;Dry;Intact 05/31/2018  4:00 PM  Line Status Flushed;Blood return noted 05/31/2018  4:00 PM  Dressing Type Transparent;Securing device 05/31/2018  4:00 PM  Dressing Status Clean;Dry;Intact;Antimicrobial disc in place 05/31/2018  4:00 PM  Dressing Change Due 06/07/18 05/31/2018  4:00 PM       Enos Fling 05/31/2018, 4:51 PM

## 2018-05-31 NOTE — TOC Progression Note (Addendum)
Transition of Care [facility]) - Progression Note    Patient Details  Name: April [name] MRN: [id number] Date of Birth: 1950/04/06  [facility] CM/SW Contact  Jacalyn Lefevre Edson Snowball, RN Phone Number: 05/31/2018, 9:56 AM  Clinical Narrative:     Plan for IV Vancomycin at home for 3 weeks. Discussed with patient. Explained home health RN will not be present every time a dose is due. She and family will be taught how to administer antibiotic.   Pam with [facility] will come to hospital prior to discharge and teach patient how to infuse antibiotic and patient will still have home health nurse , but not daily visits.   Patient nervous , not sure if she can do this at home , but agreed to visit with [place] another option would be SNF which she does not want.   Patient requesting NCM to help her appeal her insurance. She wants insurance to pay for nurse daily to stay 4 to 5 hours. NCM explained unable to do appeal, insurance does not cover daily visits. Encouraged patient to call her insurance company if she wanted to verify.  Cassie with [facility] updated. Expected Discharge Plan: [place] Barriers to Discharge: Continued Medical Work up  Expected Discharge Plan and Services Expected Discharge Plan: [place]   Discharge Planning Services: CM Consult Post Acute Care Choice: [place] arrangements for the past 2 months: Single Family Home                 DME Arranged: N/A DME Agency: NA         HH Agency: [facility]         Social Determinants of Health (SDOH) Interventions    Readmission Risk Interventions No flowsheet data found.

## 2018-05-31 NOTE — Consult Note (Addendum)
[facility] Medical Consultation  April [name] AYT:[id number] DOB: [birth year] DOA: 05/27/2018 PCP: [name], MD   Requesting physician: Erlinda Hong, PA Date of consultation:   05/31/2018 Reason for consultation: Bronchitis  Impression/Recommendations Active Problems:   Leg wound, right, sequela    1.  Cough: Patient complains of 3-day history of productive cough and pleuritic-like chest discomfort.  Patient did require intubation for debridement.  No wheezing noted on physical exam.  Two-view chest x-ray obtained showing central venous congestion concerning for possible early edema versus atypical pneumonia.  Question possibility of a PE vs. atypical pneumonia/bronchitis vs. edema.  She has not been utilizing as needed albuterol nebulizer.   -Check VQ scan   -Continue incentive spirometry -Continue current regimen of breathing treatment with Mucinex -Consider adding on azithromycin 500 mg daily x5 days for atypical coverage   2. Left foot pain: Acute.  Patient reports having new left-sided foot pain.  Patient may have been on aspirin for DVT prophylaxis -Check Doppler ultrasound of the lower extremity -Consider DVT prophylaxis if negative work-up for possible clot   3.  History of diastolic congestive heart failure: Last EF noted to be 60 about 70% with grade 1 diastolic dysfunction in 04/5571.  Patient does not appear to be grossly fluid overloaded at this time.  Home medications include furosemide 120 mg twice daily, and spironolactone 25 mg daily, Hydralazine . -Continue with current medication regimen  4.  Right leg ulcer, positive for MRSA: Patient currently on vancomycin for treatment after having debridement on 4/24, with cultures positive for MRSA.  -Per ortho   5.  Morbid obesity: BMI 61.8 kg/m  I will followup again tomorrow. Please contact me if I can be of assistance in the meanwhile. Thank you for this consultation.  Chief Complaint: Right leg wound  HPI:   April [name] is a 68 y.o. female with medical history significant of HTN, diastolic CHF, DM type II, CKD, chronic right lower extremity ulcer requiring several debridements in the past; who presented with complaints of increased drainage from the right leg ulcer.  Patient was taken for repeat debridement on 4/24, and cultures have resulted positive for MRSA.  Patient placed on antibiotics of vancomycin.  Complains of coughing up yellow mucus with discomfort in her chest over the last 3 days.  Chest discomfort is worsened somewhat with deep inspiration.  Associated symptoms include acute pain on the dorsal aspect of her left foot which she states was her good leg.  Denies having any fevers, recent trauma, or fall.   Review of Systems  Constitutional: Negative for fever.  Respiratory: Positive for cough and sputum production.   Cardiovascular: Positive for chest pain and leg swelling.  Musculoskeletal: Positive for myalgias. Negative for falls.  Psychiatric/Behavioral: The patient is nervous/anxious.     Past Medical History:  Diagnosis Date  . Anemia   . Asthma   . CAD (coronary artery disease)   . Carpal tunnel syndrome   . Cellulitis of both lower extremities 04/11/2015  . CHF (congestive heart failure) ([place])   . Chronic kidney disease    [facility]- followed by [facility]  . Colon polyp, hyperplastic 2007 & 2012  . Complication of anesthesia 1999   svt with renal calculi surgery, no problems since  . CTS (carpal tunnel syndrome)    bilateral  . Diabetes mellitus   . Eczema   . GERD (gastroesophageal reflux disease)   . History of kidney stones 1999  . Hyperlipidemia   .  Hypertension   . Leg ulcer ([place]) 04/24/2015   Right lateral leg No evidence of an infection Monitor closely Keep edema controlled   . Leg ulcer ([place])    right lower  . Meralgia paresthetica    Dr. [name]  . Morbid obesity ([place])   . Morbid obesity ([place])   . Neuropathy    toes and legs  . Osteoarthrosis,  unspecified whether generalized or localized, lower leg    knee  . PUD (peptic ulcer disease)   . Shortness of breath dyspnea    with exertion  . Sleep apnea    per progress note 02/25/2018  . Type II or unspecified type diabetes mellitus without mention of complication, not stated as uncontrolled   . Unspecified hereditary and idiopathic peripheral neuropathy   . Vitamin B12 deficiency   . Wears glasses   . Wound cellulitis    right upper leg, healing well   Past Surgical History:  Procedure Laterality Date  . ABDOMINAL HYSTERECTOMY    . CARDIAC CATHETERIZATION  2002   non obstructive disease  . colonoscopy with polypectomy  2007 & 2012    hyperplastic ;Dr Watt Climes  . COLONOSCOPY WITH PROPOFOL N/A 06/04/2015   Procedure: COLONOSCOPY WITH PROPOFOL;  Surgeon: Jerene Bears, MD;  Location: [facility];  Service: Gastroenterology;  Laterality: N/A;  . DEBRIDEMENT LEG Right 03/02/2018   WOUND VAC APPLIED  . DEBRIDEMENT LEG Right 05/27/2018   RIGHT LOWER LEG DEBRIDEMENT,  SKIN GRAFT, VAC PLACEMENT   . DILATION AND CURETTAGE OF UTERUS     multiple  . HEMORRHOID SURGERY    . I&D EXTREMITY Right 03/02/2018   Procedure: RIGHT LEG DEBRIDEMENT AND PLACE VAC;  Surgeon: [name], MD;  Location: [place];  Service: Orthopedics;  Laterality: Right;  . I&D EXTREMITY Right 03/04/2018   Procedure: REPEAT IRRIGATION AND DEBRIDEMENT RIGHT LEG, PLACE WOUND VAC;  Surgeon: [name], MD;  Location: [place];  Service: Orthopedics;  Laterality: Right;  . I&D EXTREMITY Right 03/30/2018   Procedure: IRRIGATION AND DEBRIDEMENT RIGHT LEG, APPLY WOUND VAC;  Surgeon: [name], MD;  Location: [place];  Service: Orthopedics;  Laterality: Right;  . I&D EXTREMITY Right 05/27/2018   Procedure: RIGHT LOWER LEG DEBRIDEMENT,  SKIN GRAFT, VAC PLACEMENT;  Surgeon: [name], MD;  Location: [place];  Service: Orthopedics;  Laterality: Right;  RIGHT LOWER LEG DEBRIDEMENT,  SKIN GRAFT, VAC PLACEMENT  . renal calculi   12/1997   SVT with induction of anesthesia  . right knee arthroscopy    . SKIN SPLIT GRAFT Right 03/04/2018   Procedure: POSSIBLE SPLIT THICKNESS SKIN GRAFT;  Surgeon: [name], MD;  Location: [place];  Service: Orthopedics;  Laterality: Right;  . SKIN SPLIT GRAFT Right 04/01/2018   Procedure: REPEAT IRRIGATION AND DEBRIDEMENT RIGHT LEG, APPLY SPLIT THICKNESS SKIN GRAFT;  Surgeon: [name], MD;  Location: [place];  Service: Orthopedics;  Laterality: Right;  . TONSILLECTOMY AND ADENOIDECTOMY     Social History:  reports that she has never smoked. She has never used smokeless tobacco. She reports that she does not drink alcohol or use drugs.  Allergies  Allergen Reactions  . Penicillins Rash and Other (See Comments)    She was told not to take it anymore. Has patient had a PCN reaction causing immediate rash, facial/tongue/throat swelling, SOB or lightheadedness with hypotension: Yes Has patient had a PCN reaction causing severe rash involving mucus membranes or skin necrosis: No Has patient  had a PCN reaction that required hospitalization: No Has patient had a PCN reaction occurring within the last 10 years: No If all of the above answers are "NO", then may proceed with Cephalosporin use.'  . Shellfish Allergy Anaphylaxis  . Sulfonamide Derivatives Anaphylaxis and Other (See Comments)    REACTION: internal "burning"  . Hydrochlorothiazide W-Triamterene Other (See Comments)    Hypokalemia  . Lotensin [Benazepril Hcl] Hives  . Dipyridamole Other (See Comments)    Unknown reaction  . Estrogens Other (See Comments)    Unknown reaction  . Hydrochlorothiazide Other (See Comments)    Unknown reaction  . Metronidazole Other (See Comments)    Unknown reaction  . Spironolactone Other (See Comments)    UNSPECIFIED > "kidney problems"  . Sulfa Antibiotics Other (See Comments)    Unknown reaction  . Torsemide Other (See Comments)    Unknown reaction  . Valsartan Other (See Comments)     Unknown reaction  . Madelaine Bhat Isothiocyanate] Itching   Family History  Problem Relation Age of Onset  . Colon cancer Mother   . Prostate cancer Father   . Colon cancer Father   . Diabetes Maternal Aunt   . Breast cancer Maternal Aunt   . Diabetes Maternal Uncle   . Diabetes Paternal Aunt   . Stroke Paternal Aunt        > 65  . Heart disease Paternal Aunt   . Diabetes Paternal Uncle   . Breast cancer Maternal Aunt         X 2  . Breast cancer Cousin     Prior to Admission medications   Medication Sig Start Date End Date Taking? Authorizing Provider  albuterol (PROVENTIL HFA) 108 (90 Base) MCG/ACT inhaler Inhale 2 puffs into the lungs every 4 (four) hours as needed for wheezing or shortness of breath. 09/11/16 09/27/18 Yes Burns, Claudina Lick, MD  albuterol (PROVENTIL) (2.5 MG/3ML) 0.083% nebulizer solution Take 3 mLs (2.5 mg total) by nebulization every 6 (six) hours as needed for wheezing or shortness of breath. 03/23/17  Yes Burns, Claudina Lick, MD  Amino Acids-Protein Hydrolys (FEEDING SUPPLEMENT, PRO-STAT SUGAR FREE 64,) LIQD Take 30 mLs by mouth 3 (three) times daily.   Yes [provider]  aspirin (ECOTRIN LOW STRENGTH) 81 MG EC tablet Take 81 mg by mouth at bedtime.    Yes [provider]  betamethasone dipropionate (DIPROLENE) 0.05 % cream Apply 1 application topically 2 (two) times daily as needed for rash. 05/18/18  Yes [provider]  budesonide-formoterol (SYMBICORT) 160-4.5 MCG/ACT inhaler one - two inhalations every 12 hours; gargle and spit after use Patient taking differently: Inhale 2 puffs into the lungs 2 (two) times daily as needed (shortness of breath). gargle and spit after use 02/19/15  Yes Burns, Claudina Lick, MD  calcium carbonate (TUMS - DOSED IN MG ELEMENTAL CALCIUM) 500 MG chewable tablet Chew 2 tablets by mouth daily as needed for indigestion or heartburn.   Yes [provider]  carvedilol (COREG) 25 MG tablet TAKE 1 TABLET BY  MOUTH TWICE DAILY WITH A MEAL Patient taking differently: Take 25 mg by mouth 2 (two) times daily with a meal.  07/16/17  Yes Burns, Claudina Lick, MD  COD LIVER OIL PO Take 1 capsule by mouth daily.   Yes [provider]  cyanocobalamin (,VITAMIN B-12,) 1000 MCG/ML injection INJECT 1ML INTO MUSCLE EVERY 30 DAYS Patient taking differently: Inject 1,000 mcg into the muscle every 30 (thirty) days.  05/06/18  Yes [name], MD  diclofenac sodium (VOLTAREN) 1 % GEL Apply 2 g topically 4 (four) times daily. Patient taking differently: Apply 2 g topically 4 (four) times daily as needed (pain).  03/20/16  Yes Marcial Pacas, MD  diphenhydrAMINE (BENADRYL) 12.5 MG/5ML liquid Take 25 mg by mouth daily as needed for itching.   Yes [provider]  docusate sodium (COLACE) 100 MG capsule Take 1 capsule (100 mg total) by mouth 2 (two) times daily. Patient taking differently: Take 100 mg by mouth 2 (two) times daily as needed for mild constipation.  03/09/18  Yes Rayburn, Neta Mends, PA-C  famotidine (PEPCID) 20 MG tablet Take 1 tablet (20 mg total) by mouth 2 (two) times daily. 12/14/17  Yes Burns, Claudina Lick, MD  Flaxseed, Linseed, (FLAX SEEDS PO) Take 1 capsule by mouth 2 (two) times a day.   Yes [provider]  folic acid (FOLVITE) 144 MCG tablet Take 400 mcg by mouth at bedtime.    Yes [provider]  furosemide (LASIX) 40 MG tablet Take 3 tablets (120 mg total) by mouth 2 (two) times daily. 08/14/16  Yes Burns, Claudina Lick, MD  hydrOXYzine (ATARAX/VISTARIL) 25 MG tablet Take 25 mg by mouth every 6 (six) hours as needed for itching.   Yes [provider]  Multiple Vitamin (MULTIVITAMIN WITH MINERALS) TABS tablet Take 1 tablet by mouth daily.   Yes [provider]  nystatin cream (MYCOSTATIN) Apply 1 application topically 2 (two) times daily. Patient taking differently: Apply 1 application topically 2 (two) times daily as needed (yeast).  10/26/16  Yes Burns, Claudina Lick, MD  Omega-3 Fatty Acids (FISH OIL) 1000 MG CAPS Take 1,000 mg by mouth daily.    Yes [provider]  OXcarbazepine (TRILEPTAL) 150 MG tablet Take 1 tablet (150 mg total) by mouth 2 (two) times daily. 09/13/17  Yes Marcial Pacas, MD  oxyCODONE-acetaminophen (PERCOCET/ROXICET) 5-325 MG tablet Take 1 tablet by mouth every 4 (four) hours as needed. Patient taking differently: Take 1 tablet by mouth every 4 (four) hours as needed for moderate pain.  05/19/18  Yes [name], MD  potassium chloride SA (K-DUR,KLOR-CON) 20 MEQ tablet TAKE 3 TABLETS BY MOUTH DAILY. Patient taking differently: Take 60 mEq by mouth daily.  02/25/18  Yes Burns, Claudina Lick, MD  simvastatin (ZOCOR) 40 MG tablet Take 1 tablet (40 mg total) by mouth at bedtime. 04/25/18  Yes Burns, Claudina Lick, MD  spironolactone (ALDACTONE) 25 MG tablet TAKE 1 TABLET BY MOUTH ONCE DAILY Patient taking differently: Take 25 mg by mouth daily.  01/12/18  Yes Burns, Claudina Lick, MD  traMADol (ULTRAM) 50 MG tablet Take 50-100 mg by mouth 2 (two) times daily as needed for severe pain.   Yes [provider]  ciprofloxacin (CIPRO) 500 MG tablet Take 1 tablet (500 mg total) by mouth 2 (two) times daily. Patient not taking: Reported on 05/25/2018 04/05/18   [name], MD  Lancets [facility] ULTRASOFT) lancets Use to help check blood sugars twice a day Dx E11.9 04/29/15   [name], MD  NEEDLE, DISP, 23 G (EASY TOUCH FLIPLOCK NEEDLES) 23G X 1-1/2" MISC Use to inject B12 once monthly. 05/06/18   [name], MD  polyethylene glycol (MIRALAX / Floria Raveling) packet Take 17 g by mouth daily as needed for mild constipation. Patient not taking: Reported on 05/25/2018 03/09/18   Rayburn, Neta Mends, PA-C  SYRINGE-NEEDLE, DISP, 3 ML 23G X 1" 3 ML MISC  Use with B12 to inject IM every 30 days 05/06/16   [name], MD   Physical Exam:  Constitutional: Obese elderly female who appears to be in distress sitting on edge of bed Vitals:   05/30/18  0838 05/30/18 1444 05/30/18 2106 05/31/18 0605  BP:  107/65 106/60 (!) 110/49  Pulse:  74 70 71  Resp:  17 17 17   Temp:  99.1 F (37.3 C) 99.3 F (37.4 C) 99.3 F (37.4 C)  TempSrc:  Oral Oral Oral  SpO2: 95% (!) 89% 93% 100%  Weight:      Height:       Eyes: PERRL, lids and conjunctivae normal ENMT: Mucous membranes are moist. Posterior pharynx clear of any exudate or lesions. .  Neck: increased neck conference, supple, no masses, no thyromegaly.  No JVD appreciated. Respiratory: Diminished breath sounds with no significant wheezing, crackles, or rhonchi appreciated at this time.  Patient maintaining O2 saturations on room air. Cardiovascular: Regular rate and rhythm, no murmurs / rubs / gallops.  Trace lower extremity edema.  Pulses present bilaterally lower extremities. Abdomen: no tenderness, no masses palpated. No hepatosplenomegaly. Bowel sounds positive.  Musculoskeletal: no clubbing / cyanosis. No joint deformity upper and lower extremities. Good ROM, no contractures. Normal muscle tone.   Skin: Right lower extremity currently wrapped with wound VAC present. Neurologic: CN 2-12 grossly intact.  Strength 5/5 in all 4.  Psychiatric: Normal judgment and insight. Alert and oriented x 3.  Anxious l mood.   Labs on Admission:  Basic Metabolic Panel: Recent Labs  Lab 05/27/18 0609 05/31/18 0207  NA 139  --   K 4.0  --   CL 101  --   CO2 27  --   GLUCOSE 134*  --   BUN 36*  --   CREATININE 1.74* 1.58*  CALCIUM 8.9  --    Liver Function Tests: No results for input(s): AST, ALT, ALKPHOS, BILITOT, PROT, ALBUMIN in the last 168 hours. No results for input(s): LIPASE, AMYLASE in the last 168 hours. No results for input(s): AMMONIA in the last 168 hours. CBC: Recent Labs  Lab 05/27/18 0609  WBC 5.2  HGB 11.4*  HCT 37.6  MCV 93.3  PLT 179   Cardiac Enzymes: No results for input(s): CKTOTAL, CKMB, CKMBINDEX, TROPONINI in the last 168 hours. BNP: Invalid input(s):  POCBNP CBG: Recent Labs  Lab 05/30/18 0852 05/30/18 1152 05/30/18 1648 05/30/18 2103 05/31/18 0746  GLUCAP 117* 175* 127* 97 175*    Radiological Exams on Admission: Dg Chest 2 View  Result Date: 05/31/2018 CLINICAL DATA:  69 year old female with leg pain and swelling EXAM: CHEST - 2 VIEW COMPARISON:  03/08/2018, 02/15/2018 FINDINGS: Cardiomediastinal silhouette unchanged in size and contour with cardiomegaly. Fullness in the central vasculature. Interlobular septal thickening. Blunting of the bilateral costophrenic angles with opacity at the lung base on the lateral view. Opacity on the lateral view may represent overlying soft tissues of the chest versus airspace opacity. IMPRESSION: Fullness in the central vasculature with possible early edema versus atypical infection. Electronically Signed   By: [name] D.O.   On: 05/31/2018 10:19   [place] Ekg Site Rite  Result Date: 05/31/2018 If Site Rite image not attached, placement could not be confirmed due to current cardiac rhythm.   2 view chest x-ray: Independently reviewed.  Central vascular congestion noted with possible opacity.  Time spent: >45 minutes  [facility] Pager [phone]  If 7PM-7AM, please contact night-coverage [email or web address]  Password [email or web address] 05/31/2018, 11:08 AM

## 2018-05-31 NOTE — Progress Notes (Signed)
Attempted x3 to perform left lower extremity venous duplex.  1515 Pt having sterile procedure performed in her room. Will attempt later. 75 Pt still having PICC line placed. Spoke with RN, [place], and explained that the test might be held until tomorrow 4/29. [place], [place]. Pt expected in [place] at 1750 for procedure. Told RN that this test will be performed tomorrow 06/01/2018.  05/31/18 5:53 PM Carlos Levering RVT

## 2018-05-31 NOTE — Care Management Important Message (Signed)
Important Message  Patient Details  Name: April [name] MRN: [id number] Date of Birth: 1950-10-01   Medicare Important Message Given:  Yes    Cono Gebhard Montine Circle 05/31/2018, 3:44 PM

## 2018-06-01 ENCOUNTER — Inpatient Hospital Stay (HOSPITAL_COMMUNITY): Payer: Medicare Other

## 2018-06-01 ENCOUNTER — Inpatient Hospital Stay: Payer: Self-pay

## 2018-06-01 DIAGNOSIS — Z720 Tobacco use: Secondary | ICD-10-CM

## 2018-06-01 DIAGNOSIS — M79609 Pain in unspecified limb: Secondary | ICD-10-CM

## 2018-06-01 DIAGNOSIS — J40 Bronchitis, not specified as acute or chronic: Secondary | ICD-10-CM

## 2018-06-01 LAB — GLUCOSE, CAPILLARY
Glucose-Capillary: 105 mg/dL — ABNORMAL HIGH (ref 70–99)
Glucose-Capillary: 114 mg/dL — ABNORMAL HIGH (ref 70–99)
Glucose-Capillary: 110 mg/dL — ABNORMAL HIGH (ref 70–99)
Glucose-Capillary: 105 mg/dL — ABNORMAL HIGH (ref 70–99)

## 2018-06-01 LAB — AEROBIC/ANAEROBIC CULTURE W GRAM STAIN (SURGICAL/DEEP WOUND)

## 2018-06-01 LAB — AEROBIC/ANAEROBIC CULTURE (SURGICAL/DEEP WOUND)

## 2018-06-01 LAB — CREATININE, SERUM
Creatinine, Ser: 1.42 mg/dL — ABNORMAL HIGH (ref 0.44–1.00)
GFR calc Af Amer: 44 mL/min — ABNORMAL LOW (ref >60)
GFR calc non Af Amer: 38 mL/min — ABNORMAL LOW (ref >60)

## 2018-06-01 MED ORDER — SODIUM CHLORIDE 0.9% FLUSH: 10.0000 mL | INTRAVENOUS | Status: DC | PRN

## 2018-06-01 NOTE — Progress Notes (Signed)
Obtained order from Dr. [name] for dressing change to the LLE every MWF with kerlix and coban, below the level of the knee.

## 2018-06-01 NOTE — Progress Notes (Signed)
Redressed LLE dressing as per Dr. Jess Barters orders.  Took off old dressing, washed and dried leg, applied foam dressing to tiny skin split underneath Left knee area.  Wrapped LLE with kerlix and then applied coban very light compression from toes to below knee.

## 2018-06-01 NOTE — Progress Notes (Signed)
Patient ID: April [name], female   DOB: [birth year], 68 y.o.   MRN: [id number] Patient has 300 cc in the wound VAC canister.  She states she still has her cough.  Chest x-ray was repeated this morning due to the PICC line not functioning for blood draw.  Chest x-ray confirms position.  Patient may require a new PICC line placement.  Plan for discharge to home tomorrow.  Thank you for medicine consultation.

## 2018-06-01 NOTE — Progress Notes (Signed)
PT Cancellation Note  Patient Details Name: April [name] MRN: [id number] DOB: 1951/01/08   Cancelled Treatment:    Reason Eval/Treat Not Completed: Patient at procedure or test/unavailable. Will follow-up for PT treatment as schedule permits.  [name], PT, DPT Acute Rehabilitation Services  Pager [phone] Office [place] 06/01/2018, 9:05 AM

## 2018-06-01 NOTE — Progress Notes (Signed)
PROGRESS NOTE    April [name]  SHF:[id number] DOB: [birth year] DOA: 05/27/2018 PCP: [name], MD   Brief Narrative:  April [name] is a 69 y.o. female with medical history significant of HTN, diastolic CHF, DM type II, CKD, chronic right lower extremity ulcer requiring several debridements in the past; who presented with complaints of increased drainage from the right leg ulcer.  Patient was taken for repeat debridement on 4/24, and cultures have resulted positive for MRSA.  Patient placed on antibiotics of vancomycin.  Complains of coughing up yellow mucus with discomfort in her chest over the last 3 days.  Chest discomfort is worsened somewhat with deep inspiration.  Associated symptoms include acute pain on the dorsal aspect of her left foot which she states was her good leg.  Denies having any fevers, recent trauma, or fall.   Assessment & Plan:   Active Problems:   Leg wound, right, sequela  Cough with questionable bronchitis.  Q scan unremarkable, agree with continue incentive spirometry nebs.  I would complete a course of azithromycin x5 days today is day #2.  Left foot pain..  Dopplers performed and negative although limited by body habitus.  Orthopedics is primary will defer further evaluation to orthopedics  History congestive heart failure with diastolic dysfunction with most recent EF of 60 to 70%.  Continue with home medications which include Lasix, 20 mg p.o. twice daily and spironolactone 25 mg daily.  Patient is also on hydralazine which I will continue.  She is not grossly overloaded at this point time I be cautious with fluids.  Right leg ulcer possible MRSA.  Patient currently is on vancomycin defer antibiotics and treatment to orthopedics.  DVT prophylaxis: Per primary team Code Status: Full code    Code Status Orders  (From admission, onward)         Start     Ordered   05/27/18 1001  Full code  Continuous     05/27/18 1000        Code Status History     Date Active Date Inactive Code Status Order ID Comments User Context   03/30/2018 1725 04/05/2018 1855 Full Code [id number]  [name], MD Inpatient   03/02/2018 1323 03/09/2018 1831 Full Code [id number]  [name], MD Inpatient   03/28/2016 1749 04/02/2016 1851 Full Code [id number]  [name], MD Inpatient   04/11/2015 2206 04/16/2015 1834 Full Code [id number]  [name], MD Inpatient     Family Communication: Per primary team  Disposition Plan:   Anticipate d/c tomorrow per ortho note Consults called: per primary team Admission status: Inpatient   Consultants:   per primary team  Procedures:  Dg Chest 1 View  Result Date: 06/01/2018 CLINICAL DATA:  PICC line placement. EXAM: CHEST  1 VIEW COMPARISON:  Two-view chest x-ray 05/31/2018 FINDINGS: Right-sided PICC line is been placed. The tip is at the junction of the right subclavian vein and SVC. The heart is enlarged. Atherosclerotic calcifications are present at the aortic arch. Lung volumes are low. Moderate pulmonary vascular congestion is stable. IMPRESSION: 1. PICC line terminates at the junction of the SVC and right subclavian vein. 2. Stable cardiomegaly and moderate pulmonary vascular congestion. Electronically Signed   By: San Morelle M.D.   On: 06/01/2018 04:56   Dg Chest 2 View  Result Date: 05/31/2018 CLINICAL DATA:  68 year old female with leg pain and swelling EXAM: CHEST - 2 VIEW COMPARISON:  03/08/2018, 02/15/2018 FINDINGS: Cardiomediastinal silhouette unchanged  in size and contour with cardiomegaly. Fullness in the central vasculature. Interlobular septal thickening. Blunting of the bilateral costophrenic angles with opacity at the lung base on the lateral view. Opacity on the lateral view may represent overlying soft tissues of the chest versus airspace opacity. IMPRESSION: Fullness in the central vasculature with possible early edema versus atypical infection. Electronically Signed   By: [name]  D.O.   On: 05/31/2018 10:19   Nm Pulmonary Perf And Vent  Result Date: 05/31/2018 CLINICAL DATA:  Chest pain and productive cough for 3 days EXAM: NUCLEAR MEDICINE PERFUSION LUNG SCAN TECHNIQUE: Perfusion images were obtained in multiple projections after intravenous injection of radiopharmaceutical. RADIOPHARMACEUTICALS:  1.7 mCi Tc-30m MAA IV COMPARISON:  Chest radiograph 05/31/2018 FINDINGS: Due to patient condition, only anterior, LAO and RAO perfusion views could be obtained. Posterior, RPO and LPO perfusion views could not be performed. Obtained perfusion images are normal. IMPRESSION: Limited anterior, LAO and RAO views of perfusion lung scan, with remaining views unable to be obtained. Obtained perfusion images are normal. Electronically Signed   By: [name] M.D.   On: 05/31/2018 19:35   Vas [place] Lower Extremity Venous (dvt)  Result Date: 06/01/2018  Lower Venous Study Indications: Pain.  Limitations: Body habitus and poor ultrasound/tissue interface. Performing Technologist: Abram Sander RVS  Examination Guidelines: A complete evaluation includes B-mode imaging, spectral Doppler, color Doppler, and power Doppler as needed of all accessible portions of each vessel. Bilateral testing is considered an integral part of a complete examination. Limited examinations for reoccurring indications may be performed as noted.  +---------+---------------+---------+-----------+----------+--------------+  LEFT      Compressibility Phasicity Spontaneity Properties Summary         +---------+---------------+---------+-----------+----------+--------------+  CFV       Full            Yes       Yes                                    +---------+---------------+---------+-----------+----------+--------------+  SFJ       Full                                                             +---------+---------------+---------+-----------+----------+--------------+  FV Prox   Full                                                              +---------+---------------+---------+-----------+----------+--------------+  FV Mid    Full                                                             +---------+---------------+---------+-----------+----------+--------------+  FV Distal Full                                                             +---------+---------------+---------+-----------+----------+--------------+  PFV                                                        Not visualized  +---------+---------------+---------+-----------+----------+--------------+  POP       Full            Yes       Yes                                    +---------+---------------+---------+-----------+----------+--------------+  PTV       Full                                                             +---------+---------------+---------+-----------+----------+--------------+  PERO                                                       Not visualized  +---------+---------------+---------+-----------+----------+--------------+     Summary: Left: There is no evidence of deep vein thrombosis in the lower extremity. However, portions of this examination were limited- see technologist comments above. No cystic structure found in the popliteal fossa.  *See table(s) above for measurements and observations. Electronically signed by Curt Jews MD on 06/01/2018 at 5:17:18 PM.    Final    [place] Ekg Site Rite  Result Date: 06/01/2018 If Site Rite image not attached, placement could not be confirmed due to current cardiac rhythm.  [place] Ekg Site Rite  Result Date: 05/31/2018 If [facility] image not attached, placement could not be confirmed due to current cardiac rhythm.    Antimicrobials:   Azithromycin day #2   Subjective: No acute events overnight.  Resting comfortably in bed.  Objective: Vitals:   05/31/18 1700 05/31/18 2203 06/01/18 0446 06/01/18 1418  BP: (!) 114/59 (!) 120/55 126/63 130/65  Pulse: 70 79 83 74  Resp: 18 18 18 18   Temp: 98.9 F  (37.2 C) 99.5 F (37.5 C) 99.5 F (37.5 C) 99.5 F (37.5 C)  TempSrc: Oral Oral Oral Oral  SpO2: 99% 93% 96% 100%  Weight:      Height:        Intake/Output Summary (Last 24 hours) at 06/01/2018 1721 Last data filed at 06/01/2018 1300 Gross per 24 hour  Intake 960 ml  Output 1600 ml  Net -640 ml   Filed Weights   05/27/18 0555  Weight: (!) 163.3 kg    Examination:  General exam: Appears calm and comfortable morbidly obese Respiratory system: Clear to auscultation. Respiratory effort normal. Cardiovascular system: S1 & S2 heard, RRR. No JVD, murmurs, rubs, gallops or clicks. No pedal edema. Gastrointestinal system: Abdomen is nondistended, soft and nontender. No organomegaly or masses felt. Normal bowel sounds heard. Central nervous system: Alert and oriented. No focal neurological deficits. Extremities: Wound VAC in place.  Neurovascularly intact Skin: No rashes, lesions or ulcers Psychiatry: Judgement and insight appear normal. Mood & affect appropriate.  Data Reviewed: I have personally reviewed following labs and imaging studies  CBC: Recent Labs  Lab 05/27/18 0609  WBC 5.2  HGB 11.4*  HCT 37.6  MCV 93.3  PLT 211   Basic Metabolic Panel: Recent Labs  Lab 05/27/18 0609 05/31/18 0207 06/01/18 0422  NA 139  --   --   K 4.0  --   --   CL 101  --   --   CO2 27  --   --   GLUCOSE 134*  --   --   BUN 36*  --   --   CREATININE 1.74* 1.58* 1.42*  CALCIUM 8.9  --   --    GFR: Estimated Creatinine Clearance: 59.5 mL/min (A) (by C-G formula based on SCr of 1.42 mg/dL (H)). Liver Function Tests: No results for input(s): AST, ALT, ALKPHOS, BILITOT, PROT, ALBUMIN in the last 168 hours. No results for input(s): LIPASE, AMYLASE in the last 168 hours. No results for input(s): AMMONIA in the last 168 hours. Coagulation Profile: No results for input(s): INR, PROTIME in the last 168 hours. Cardiac Enzymes: No results for input(s): CKTOTAL, CKMB, CKMBINDEX,  TROPONINI in the last 168 hours. BNP (last 3 results) No results for input(s): PROBNP in the last 8760 hours. HbA1C: No results for input(s): HGBA1C in the last 72 hours. CBG: Recent Labs  Lab 05/31/18 1653 05/31/18 2200 06/01/18 0728 06/01/18 1122 06/01/18 1557  GLUCAP 119* 130* 105* 114* 110*   Lipid Profile: No results for input(s): CHOL, HDL, LDLCALC, TRIG, CHOLHDL, LDLDIRECT in the last 72 hours. Thyroid Function Tests: No results for input(s): TSH, T4TOTAL, FREET4, T3FREE, THYROIDAB in the last 72 hours. Anemia Panel: No results for input(s): VITAMINB12, FOLATE, FERRITIN, TIBC, IRON, RETICCTPCT in the last 72 hours. Sepsis Labs: No results for input(s): PROCALCITON, LATICACIDVEN in the last 168 hours.  Recent Results (from the past 240 hour(s))  Aerobic/Anaerobic Culture (surgical/deep wound)     Status: None   Collection Time: 05/27/18  8:02 AM  Result Value Ref Range Status   Specimen Description TISSUE RIGHT LEG  Final   Special Requests PATIENT ON FOLLOWING CLINDAMYCIN  Final   Gram Stain   Final    FEW WBC PRESENT, PREDOMINANTLY PMN MODERATE GRAM POSITIVE RODS RARE GRAM POSITIVE COCCI IN PAIRS    Culture   Final    ABUNDANT METHICILLIN RESISTANT STAPHYLOCOCCUS AUREUS ABUNDANT CORYNEBACTERIUM STRIATUM Standardized susceptibility testing for this organism is not available. NO ANAEROBES ISOLATED Performed at [facility], [place] [address] [place], [place] [address]    Report Status 06/01/2018 FINAL  Final   Organism ID, Bacteria METHICILLIN RESISTANT STAPHYLOCOCCUS AUREUS  Final      Susceptibility   Methicillin resistant staphylococcus aureus - MIC*    CIPROFLOXACIN >=8 RESISTANT Resistant     ERYTHROMYCIN >=8 RESISTANT Resistant     GENTAMICIN <=0.5 SENSITIVE Sensitive     OXACILLIN >=4 RESISTANT Resistant     TETRACYCLINE >=16 RESISTANT Resistant     VANCOMYCIN 1 SENSITIVE Sensitive     TRIMETH/SULFA <=10 SENSITIVE Sensitive     CLINDAMYCIN >=8  RESISTANT Resistant     RIFAMPIN <=0.5 SENSITIVE Sensitive     Inducible Clindamycin NEGATIVE Sensitive     * ABUNDANT METHICILLIN RESISTANT STAPHYLOCOCCUS AUREUS         Radiology Studies: Dg Chest 1 View  Result Date: 06/01/2018 CLINICAL DATA:  PICC line placement. EXAM: CHEST  1 VIEW COMPARISON:  Two-view chest x-ray 05/31/2018 FINDINGS: Right-sided PICC line  is been placed. The tip is at the junction of the right subclavian vein and SVC. The heart is enlarged. Atherosclerotic calcifications are present at the aortic arch. Lung volumes are low. Moderate pulmonary vascular congestion is stable. IMPRESSION: 1. PICC line terminates at the junction of the SVC and right subclavian vein. 2. Stable cardiomegaly and moderate pulmonary vascular congestion. Electronically Signed   By: San Morelle M.D.   On: 06/01/2018 04:56   Dg Chest 2 View  Result Date: 05/31/2018 CLINICAL DATA:  68 year old female with leg pain and swelling EXAM: CHEST - 2 VIEW COMPARISON:  03/08/2018, 02/15/2018 FINDINGS: Cardiomediastinal silhouette unchanged in size and contour with cardiomegaly. Fullness in the central vasculature. Interlobular septal thickening. Blunting of the bilateral costophrenic angles with opacity at the lung base on the lateral view. Opacity on the lateral view may represent overlying soft tissues of the chest versus airspace opacity. IMPRESSION: Fullness in the central vasculature with possible early edema versus atypical infection. Electronically Signed   By: [name] D.O.   On: 05/31/2018 10:19   Nm Pulmonary Perf And Vent  Result Date: 05/31/2018 CLINICAL DATA:  Chest pain and productive cough for 3 days EXAM: NUCLEAR MEDICINE PERFUSION LUNG SCAN TECHNIQUE: Perfusion images were obtained in multiple projections after intravenous injection of radiopharmaceutical. RADIOPHARMACEUTICALS:  1.7 mCi Tc-6m MAA IV COMPARISON:  Chest radiograph 05/31/2018 FINDINGS: Due to patient condition, only  anterior, LAO and RAO perfusion views could be obtained. Posterior, RPO and LPO perfusion views could not be performed. Obtained perfusion images are normal. IMPRESSION: Limited anterior, LAO and RAO views of perfusion lung scan, with remaining views unable to be obtained. Obtained perfusion images are normal. Electronically Signed   By: [name] M.D.   On: 05/31/2018 19:35   Vas [place] Lower Extremity Venous (dvt)  Result Date: 06/01/2018  Lower Venous Study Indications: Pain.  Limitations: Body habitus and poor ultrasound/tissue interface. Performing Technologist: Abram Sander RVS  Examination Guidelines: A complete evaluation includes B-mode imaging, spectral Doppler, color Doppler, and power Doppler as needed of all accessible portions of each vessel. Bilateral testing is considered an integral part of a complete examination. Limited examinations for reoccurring indications may be performed as noted.  +---------+---------------+---------+-----------+----------+--------------+  LEFT      Compressibility Phasicity Spontaneity Properties Summary         +---------+---------------+---------+-----------+----------+--------------+  CFV       Full            Yes       Yes                                    +---------+---------------+---------+-----------+----------+--------------+  SFJ       Full                                                             +---------+---------------+---------+-----------+----------+--------------+  FV Prox   Full                                                             +---------+---------------+---------+-----------+----------+--------------+  FV Mid    Full                                                             +---------+---------------+---------+-----------+----------+--------------+  FV Distal Full                                                             +---------+---------------+---------+-----------+----------+--------------+  PFV                                                         Not visualized  +---------+---------------+---------+-----------+----------+--------------+  POP       Full            Yes       Yes                                    +---------+---------------+---------+-----------+----------+--------------+  PTV       Full                                                             +---------+---------------+---------+-----------+----------+--------------+  PERO                                                       Not visualized  +---------+---------------+---------+-----------+----------+--------------+     Summary: Left: There is no evidence of deep vein thrombosis in the lower extremity. However, portions of this examination were limited- see technologist comments above. No cystic structure found in the popliteal fossa.  *See table(s) above for measurements and observations. Electronically signed by Curt Jews MD on 06/01/2018 at 5:17:18 PM.    Final    [place] Ekg Site Rite  Result Date: 06/01/2018 If Site Rite image not attached, placement could not be confirmed due to current cardiac rhythm.  [place] Ekg Site Rite  Result Date: 05/31/2018 If [facility] image not attached, placement could not be confirmed due to current cardiac rhythm.       Scheduled Meds:  aspirin EC  81 mg Oral QHS   azithromycin  500 mg Oral Daily   carvedilol  25 mg Oral BID WC   docusate sodium  100 mg Oral BID   famotidine  20 mg Oral BID   feeding supplement (PRO-STAT SUGAR FREE 64)  30 mL Oral TID   furosemide  120 mg Oral BID   guaiFENesin  600 mg Oral BID   insulin aspart  0-20 Units Subcutaneous TID WC   insulin aspart  6 Units Subcutaneous TID WC  mometasone-formoterol  2 puff Inhalation BID   multivitamin with minerals  1 tablet Oral Daily   OXcarbazepine  150 mg Oral BID   potassium chloride SA  60 mEq Oral Daily   simvastatin  40 mg Oral QHS   sodium chloride flush  10-40 mL Intracatheter Q12H   spironolactone  25 mg Oral Daily    Continuous Infusions:  sodium chloride 10 mL/hr at 05/30/18 1315   methocarbamol (ROBAXIN) IV     vancomycin 2,250 mg (06/01/18 1500)     LOS: 5 days    Time spent: 35 min    Nicolette Bang, MD [facility]  If 7PM-7AM, please contact night-coverage  06/01/2018, 5:21 PM

## 2018-06-01 NOTE — TOC Progression Note (Signed)
Transition of Care [facility]) - Progression Note    Patient Details  Name: April [name] MRN: [id number] Date of Birth: [birth year]  Transition of Care [facility]) CM/SW Contact  Madelyne Millikan, Edson Snowball, RN Phone Number: 06/01/2018, 2:26 PM  Clinical Narrative:       Expected Discharge Plan: [place] Barriers to Discharge: Continued Medical Work up  Expected Discharge Plan and Services Expected Discharge Plan: [place]   Discharge Planning Services: CM Consult Post Acute Care Choice: [place] arrangements for the past 2 months: Single Family Home                 DME Arranged: N/A DME Agency: NA       HH Arranged: RN, PT, Disease Management, OT, Nurse's Aide [place] Agency: [facility] Date HH Agency Contacted: 06/01/18 Time [place]: 53 Representative spoke with at [place]: Cassie   Social Determinants of Health ([place]) Interventions    Readmission Risk Interventions No flowsheet data found.

## 2018-06-01 NOTE — Progress Notes (Signed)
Lower extremity venous duplex has been completed.   Preliminary results in CV Proc.   Abram Sander 06/01/2018 10:51 AM

## 2018-06-01 NOTE — Progress Notes (Signed)
Pt states she wants resumption of [facility] services post discharge.

## 2018-06-01 NOTE — Progress Notes (Signed)
Pharmacy Antibiotic Note  April [name] is a 68 y.o. female admitted on 05/27/2018 with cellulitis of chronic RLE ulcer.  Underwent debridement of wound, skin graft, VAC placement 4/24. WoundCx growing abundant MRSA. Pharmacy has been consulted for Vancomycin dosing.  Plan: Consider d/c Clindamycin  Vancomycin 2500mg  x1, then 2g q36hr - AUC est 511, using SCr 1.42 - PICC line not functioning correctly, IV team manipulating - Plan discharge home 4/30  Daily SCr - assess 4/30, if able to dose q24 - change to appropriate dose q24hr Coordinating abx dose/schedule with [facility] S[facility]   Height: 5\' 4"  (162.6 cm) Weight: (!) 360 lb 0.2 oz (163.3 kg) IBW/kg (Calculated) : 54.7  Temp (24hrs), Avg:99.3 F (37.4 C), Min:98.9 F (37.2 C), Max:99.5 F (37.5 C)  Recent Labs  Lab 05/27/18 0609 05/31/18 0207 06/01/18 0422  WBC 5.2  --   --   CREATININE 1.74* 1.58* 1.42*    Estimated Creatinine Clearance: 59.5 mL/min (A) (by C-G formula based on SCr of 1.42 mg/dL (H)).    Allergies  Allergen Reactions  . Penicillins Rash and Other (See Comments)    She was told not to take it anymore. Has patient had a PCN reaction causing immediate rash, facial/tongue/throat swelling, SOB or lightheadedness with hypotension: Yes Has patient had a PCN reaction causing severe rash involving mucus membranes or skin necrosis: No Has patient had a PCN reaction that required hospitalization: No Has patient had a PCN reaction occurring within the last 10 years: No If all of the above answers are "NO", then may proceed with Cephalosporin use.'  . Shellfish Allergy Anaphylaxis  . Sulfonamide Derivatives Anaphylaxis and Other (See Comments)    REACTION: internal "burning"  . Hydrochlorothiazide W-Triamterene Other (See Comments)    Hypokalemia  . Lotensin [Benazepril Hcl] Hives  . Dipyridamole Other (See Comments)    Unknown reaction  . Estrogens Other (See Comments)    Unknown reaction  .  Hydrochlorothiazide Other (See Comments)    Unknown reaction  . Metronidazole Other (See Comments)    Unknown reaction  . Spironolactone Other (See Comments)    UNSPECIFIED > "kidney problems"  . Sulfa Antibiotics Other (See Comments)    Unknown reaction  . Torsemide Other (See Comments)    Unknown reaction  . Valsartan Other (See Comments)    Unknown reaction  . Madelaine Bhat Isothiocyanate] Itching    Antimicrobials this admission: 4/24 Clindamycin >> 4/27 4/27 Vanc >>   Dose adjustments this admission: For decr SCr 1.42 - 2gm q36 for AUC 511 > 4/29                       1.42 - 1500mg  q24 AUC 574 Microbiology results: 4/24 WoundCx: abundant MRSA  Thank you for allowing pharmacy to be a part of this patient's care.  Minda Ditto 06/01/2018 9:56 AM  [phone] till 3p

## 2018-06-01 NOTE — Progress Notes (Signed)
Pt has a dressing LLE which is usually changed every MWF according to the pt.  Will call Dr. [name] to see if needs order for dressing change.

## 2018-06-02 DIAGNOSIS — M25579 Pain in unspecified ankle and joints of unspecified foot: Secondary | ICD-10-CM

## 2018-06-02 LAB — CREATININE, SERUM
Creatinine, Ser: 1.37 mg/dL — ABNORMAL HIGH (ref 0.44–1.00)
GFR calc Af Amer: 46 mL/min — ABNORMAL LOW (ref >60)
GFR calc non Af Amer: 40 mL/min — ABNORMAL LOW (ref >60)

## 2018-06-02 LAB — URINALYSIS, ROUTINE W REFLEX MICROSCOPIC
Bilirubin Urine: NEGATIVE
Glucose, UA: NEGATIVE mg/dL
Hgb urine dipstick: NEGATIVE
Ketones, ur: NEGATIVE mg/dL
Leukocytes,Ua: NEGATIVE
Nitrite: NEGATIVE
Protein, ur: NEGATIVE mg/dL
Specific Gravity, Urine: 1.011 (ref 1.005–1.030)
pH: 6 (ref 5.0–8.0)

## 2018-06-02 LAB — GLUCOSE, CAPILLARY
Glucose-Capillary: 101 mg/dL — ABNORMAL HIGH (ref 70–99)
Glucose-Capillary: 159 mg/dL — ABNORMAL HIGH (ref 70–99)
Glucose-Capillary: 86 mg/dL (ref 70–99)
Glucose-Capillary: 96 mg/dL (ref 70–99)

## 2018-06-02 MED ORDER — VANCOMYCIN HCL 10 G IV SOLR: 2250.0000 mg | INTRAVENOUS | 12 refills | Status: DC

## 2018-06-02 MED ORDER — VANCOMYCIN HCL 10 G IV SOLR
1500.0000 mg | INTRAVENOUS | Status: DC
  Administered 2018-06-03: 10:00:00 1500 mg via INTRAVENOUS
  Filled 2018-06-02 (×2): qty 1500

## 2018-06-02 MED ORDER — OXYCODONE-ACETAMINOPHEN 5-325 MG PO TABS: 1.0000 | ORAL_TABLET | ORAL | 0 refills | Status: DC | PRN

## 2018-06-02 MED ORDER — AZITHROMYCIN 250 MG PO TABS: ORAL_TABLET | ORAL | 0 refills | Status: AC

## 2018-06-02 NOTE — Progress Notes (Signed)
Pharmacy Antibiotic Note  April [name] is a 68 y.o. female admitted on 05/27/2018 with cellulitis of chronic RLE ulcer.  Underwent debridement of wound, skin graft, VAC placement 4/24. WoundCx growing abundant MRSA. Pharmacy has been consulted for Vancomycin dosing.  Plan: Vancomycin 2500mg  x1, then 2250mg  q48hr based on sCr 1.74 - SCr decreasing 1.58 >> 1.37 - Vanc 1500mg  q24 would deliver AUC 557, will check trough in am 5/1, and assess ability to dose q24 hr for anticipated discharge 5/1  Daily SCr - assess 4/30, if able to dose q24 - change to appropriate dose q24hr. Coordinating abx dose/schedule with Carolynn Sayers [facility]   Height: 5\' 4"  (162.6 cm) Weight: (!) 360 lb 0.2 oz (163.3 kg) IBW/kg (Calculated) : 54.7  Temp (24hrs), Avg:99.6 F (37.6 C), Min:98.6 F (37 C), Max:100.6 F (38.1 C)  Recent Labs  Lab 05/27/18 0609 05/31/18 0207 06/01/18 0422 06/02/18 0433  WBC 5.2  --   --   --   CREATININE 1.74* 1.58* 1.42* 1.37*    Estimated Creatinine Clearance: 61.7 mL/min (A) (by C-G formula based on SCr of 1.37 mg/dL (H)).    Allergies  Allergen Reactions  . Penicillins Rash and Other (See Comments)    She was told not to take it anymore. Has patient had a PCN reaction causing immediate rash, facial/tongue/throat swelling, SOB or lightheadedness with hypotension: Yes Has patient had a PCN reaction causing severe rash involving mucus membranes or skin necrosis: No Has patient had a PCN reaction that required hospitalization: No Has patient had a PCN reaction occurring within the last 10 years: No If all of the above answers are "NO", then may proceed with Cephalosporin use.'  . Shellfish Allergy Anaphylaxis  . Sulfonamide Derivatives Anaphylaxis and Other (See Comments)    REACTION: internal "burning"  . Hydrochlorothiazide W-Triamterene Other (See Comments)    Hypokalemia  . Lotensin [Benazepril Hcl] Hives  . Dipyridamole Other (See Comments)    Unknown reaction  .  Estrogens Other (See Comments)    Unknown reaction  . Hydrochlorothiazide Other (See Comments)    Unknown reaction  . Metronidazole Other (See Comments)    Unknown reaction  . Spironolactone Other (See Comments)    UNSPECIFIED > "kidney problems"  . Sulfa Antibiotics Other (See Comments)    Unknown reaction  . Torsemide Other (See Comments)    Unknown reaction  . Valsartan Other (See Comments)    Unknown reaction  . Madelaine Bhat Isothiocyanate] Itching    Antimicrobials this admission: 4/24 Clindamycin >> 4/27 4/27 Vanc >>   Dose adjustments this admission: For decr SCr 1.37 - 1500mg  q24 AUC 557  Microbiology results: 4/24 WoundCx: abundant MRSA  Thank you for allowing pharmacy to be a part of this patient's care.  Minda Ditto 06/02/2018 11:28 AM  [phone] till 3p

## 2018-06-02 NOTE — Progress Notes (Signed)
PROGRESS NOTE    April [name]  PIR:[id number] DOB: 1950/05/17 DOA: 05/27/2018 PCP: [name], MD   Brief Narrative:  April [name] a 68 y.o.femalewith medical history significant ofHTN, diastolic CHF, DM type II, CKD, chronic right lower extremity ulcer requiring several debridements in the past; who presented with complaints of increased drainage from the right leg ulcer. Patient was taken for repeat debridement on 4/24, and cultures have resulted positive for MRSA. Patient placed on antibiotics of vancomycin. Complains of coughing up yellow mucus with discomfort in her chest over the last 3 days. Chest discomfort is worsened somewhat with deep inspiration. Associated symptoms include acute pain on the dorsal aspect of her left foot which she states was her good leg. Denies having any fevers, recent trauma, or fall.   Assessment & Plan:   Active Problems:   Leg wound, right, sequela   Cough with questionable bronchitis with mild fever x1.    As previously noted, VQ scan unremarkable, agree with continue incentive spirometry nebs.  I would complete a course of azithromycin x5 days today is day #3.  Left foot pain..  Dopplers performed and negative although limited by body habitus.  Orthopedics is primary will defer further evaluation to orthopedics  History congestive heart failure with diastolic dysfunction with most recent EF of 60 to 70%.  Continue with home medications which include Lasix, 20 mg p.o. twice daily and spironolactone 25 mg daily.  Patient is also on hydralazine which I will continue.  She is not grossly overloaded at this point time I be cautious with fluids.  Right leg ulcer possible MRSA.  Patient currently is on vancomycin defer antibiotics and treatment to orthopedics.  Given patient's fever and right leg ulceration with instrumentation I did check blood cultures which are pending, ordered a CBC which is pending and a UA which was negative.  Patient  is on vancomycin.   DVT prophylaxis: Per primary team Code Status: Full code    Code Status Orders  (From admission, onward)         Start     Ordered   05/27/18 1001  Full code  Continuous     05/27/18 1000        Code Status History    Date Active Date Inactive Code Status Order ID Comments User Context   03/30/2018 1725 04/05/2018 1855 Full Code [id number]  April Minion, MD Inpatient   03/02/2018 1323 03/09/2018 1831 Full Code [id number]  April Minion, MD Inpatient   03/28/2016 1749 04/02/2016 1851 Full Code [id number]  April Brod, MD Inpatient   04/11/2015 2206 04/16/2015 1834 Full Code [id number]  April Jaffe, MD Inpatient     Family Communication: Per primary team Disposition Plan:  Discharge disposition: 01-Home or Self Care       Consults called: PER PRIMARY TEAM Admission status: Inpatient   Consultants:   PER PRIMARY TEAM  Procedures:  Dg Chest 1 View  Result Date: 06/01/2018 CLINICAL DATA:  PICC line placement. EXAM: CHEST  1 VIEW COMPARISON:  Two-view chest x-ray 05/31/2018 FINDINGS: Right-sided PICC line is been placed. The tip is at the junction of the right subclavian vein and SVC. The heart is enlarged. Atherosclerotic calcifications are present at the aortic arch. Lung volumes are low. Moderate pulmonary vascular congestion is stable. IMPRESSION: 1. PICC line terminates at the junction of the SVC and right subclavian vein. 2. Stable cardiomegaly and moderate pulmonary vascular congestion. Electronically Signed   By: April [name]  April [name] M.D.   On: 06/01/2018 04:56   Dg Chest 2 View  Result Date: 05/31/2018 CLINICAL DATA:  68 year old female with leg pain and swelling EXAM: CHEST - 2 VIEW COMPARISON:  03/08/2018, 02/15/2018 FINDINGS: Cardiomediastinal silhouette unchanged in size and contour with cardiomegaly. Fullness in the central vasculature. Interlobular septal thickening. Blunting of the bilateral costophrenic angles with opacity at the lung base  on the lateral view. Opacity on the lateral view may represent overlying soft tissues of the chest versus airspace opacity. IMPRESSION: Fullness in the central vasculature with possible early edema versus atypical infection. Electronically Signed   By: April [name] D.O.   On: 05/31/2018 10:19   Nm Pulmonary Perf And Vent  Result Date: 05/31/2018 CLINICAL DATA:  Chest pain and productive cough for 3 days EXAM: NUCLEAR MEDICINE PERFUSION LUNG SCAN TECHNIQUE: Perfusion images were obtained in multiple projections after intravenous injection of radiopharmaceutical. RADIOPHARMACEUTICALS:  1.7 mCi Tc-24m MAA IV COMPARISON:  Chest radiograph 05/31/2018 FINDINGS: Due to patient condition, only anterior, LAO and RAO perfusion views could be obtained. Posterior, RPO and LPO perfusion views could not be performed. Obtained perfusion images are normal. IMPRESSION: Limited anterior, LAO and RAO views of perfusion lung scan, with remaining views unable to be obtained. Obtained perfusion images are normal. Electronically Signed   By: April [name] M.D.   On: 05/31/2018 19:35   Vas [place] Lower Extremity Venous (dvt)  Result Date: 06/01/2018  Lower Venous Study Indications: Pain.  Limitations: Body habitus and poor ultrasound/tissue interface. Performing Technologist: April [name] RVS  Examination Guidelines: A complete evaluation includes B-mode imaging, spectral Doppler, color Doppler, and power Doppler as needed of all accessible portions of each vessel. Bilateral testing is considered an integral part of a complete examination. Limited examinations for reoccurring indications may be performed as noted.  +---------+---------------+---------+-----------+----------+--------------+  LEFT      Compressibility Phasicity Spontaneity Properties Summary         +---------+---------------+---------+-----------+----------+--------------+  CFV       Full            Yes       Yes                                     +---------+---------------+---------+-----------+----------+--------------+  SFJ       Full                                                             +---------+---------------+---------+-----------+----------+--------------+  FV Prox   Full                                                             +---------+---------------+---------+-----------+----------+--------------+  FV Mid    Full                                                             +---------+---------------+---------+-----------+----------+--------------+  FV Distal Full                                                             +---------+---------------+---------+-----------+----------+--------------+  PFV                                                        Not visualized  +---------+---------------+---------+-----------+----------+--------------+  POP       Full            Yes       Yes                                    +---------+---------------+---------+-----------+----------+--------------+  PTV       Full                                                             +---------+---------------+---------+-----------+----------+--------------+  PERO                                                       Not visualized  +---------+---------------+---------+-----------+----------+--------------+     Summary: Left: There is no evidence of deep vein thrombosis in the lower extremity. However, portions of this examination were limited- see technologist comments above. No cystic structure found in the popliteal fossa.  *See table(s) above for measurements and observations. Electronically signed by Curt Jews MD on 06/01/2018 at 5:17:18 PM.    Final    [place] Ekg Site Rite  Result Date: 06/01/2018 If Site Rite image not attached, placement could not be confirmed due to current cardiac rhythm.  [place] Ekg Site Rite  Result Date: 05/31/2018 If [facility] image not attached, placement could not be confirmed due to current cardiac  rhythm.    Antimicrobials:   Azithromycin day 3  Vancomycin dosing per pharmacy   Subjective: No acute events overnight.  Patient did have a fever x1, hemodynamically stable remains afebrile since no further complications  Objective: Vitals:   06/01/18 1418 06/01/18 2109 06/02/18 0536 06/02/18 1337  BP: 130/65 (!) 128/98 114/61 108/65  Pulse: 74 78 72 85  Resp: 18 19 18 18   Temp: 99.5 F (37.5 C) (!) 100.6 F (38.1 C) 98.6 F (37 C) 99.4 F (37.4 C)  TempSrc: Oral Oral Oral Oral  SpO2: 100% 91% 97% 91%  Weight:      Height:        Intake/Output Summary (Last 24 hours) at 06/02/2018 1423 Last data filed at 06/02/2018 1250 Gross per 24 hour  Intake 1078 ml  Output 3150 ml  Net -2072 ml   Filed Weights   05/27/18 0555  Weight: (!) 163.3 kg    Examination:  General exam: Appears calm and comfortable  Respiratory system: Clear to auscultation. Respiratory effort normal. Cardiovascular system: S1 & S2 heard, RRR. No JVD, murmurs, rubs, gallops or clicks. No pedal edema. Gastrointestinal system: Abdomen is nondistended, soft and nontender. No organomegaly or masses felt. Normal bowel sounds heard. Central nervous system: Alert and oriented. No focal neurological deficits. Extremities: Neurovascular intact, VAC in place Skin: No rashes, lesions or ulcers Psychiatry: Judgement and insight appear normal. Mood & affect appropriate.     Data Reviewed: I have personally reviewed following labs and imaging studies  CBC: Recent Labs  Lab 05/27/18 0609  WBC 5.2  HGB 11.4*  HCT 37.6  MCV 93.3  PLT 786   Basic Metabolic Panel: Recent Labs  Lab 05/27/18 0609 05/31/18 0207 06/01/18 0422 06/02/18 0433  NA 139  --   --   --   K 4.0  --   --   --   CL 101  --   --   --   CO2 27  --   --   --   GLUCOSE 134*  --   --   --   BUN 36*  --   --   --   CREATININE 1.74* 1.58* 1.42* 1.37*  CALCIUM 8.9  --   --   --    GFR: Estimated Creatinine Clearance: 61.7 mL/min  (A) (by C-G formula based on SCr of 1.37 mg/dL (H)). Liver Function Tests: No results for input(s): AST, ALT, ALKPHOS, BILITOT, PROT, ALBUMIN in the last 168 hours. No results for input(s): LIPASE, AMYLASE in the last 168 hours. No results for input(s): AMMONIA in the last 168 hours. Coagulation Profile: No results for input(s): INR, PROTIME in the last 168 hours. Cardiac Enzymes: No results for input(s): CKTOTAL, CKMB, CKMBINDEX, TROPONINI in the last 168 hours. BNP (last 3 results) No results for input(s): PROBNP in the last 8760 hours. HbA1C: No results for input(s): HGBA1C in the last 72 hours. CBG: Recent Labs  Lab 06/01/18 1122 06/01/18 1557 06/01/18 2058 06/02/18 0723 06/02/18 1119  GLUCAP 114* 110* 105* 101* 159*   Lipid Profile: No results for input(s): CHOL, HDL, LDLCALC, TRIG, CHOLHDL, LDLDIRECT in the last 72 hours. Thyroid Function Tests: No results for input(s): TSH, T4TOTAL, FREET4, T3FREE, THYROIDAB in the last 72 hours. Anemia Panel: No results for input(s): VITAMINB12, FOLATE, FERRITIN, TIBC, IRON, RETICCTPCT in the last 72 hours. Sepsis Labs: No results for input(s): PROCALCITON, LATICACIDVEN in the last 168 hours.  Recent Results (from the past 240 hour(s))  Aerobic/Anaerobic Culture (surgical/deep wound)     Status: None   Collection Time: 05/27/18  8:02 AM  Result Value Ref Range Status   Specimen Description TISSUE RIGHT LEG  Final   Special Requests PATIENT ON FOLLOWING CLINDAMYCIN  Final   Gram Stain   Final    FEW WBC PRESENT, PREDOMINANTLY PMN MODERATE GRAM POSITIVE RODS RARE GRAM POSITIVE COCCI IN PAIRS    Culture   Final    ABUNDANT METHICILLIN RESISTANT STAPHYLOCOCCUS AUREUS ABUNDANT CORYNEBACTERIUM STRIATUM Standardized susceptibility testing for this organism is not available. NO ANAEROBES ISOLATED Performed at [facility], [place] [address] [place], [place] [address]    Report Status 06/01/2018 FINAL  Final   Organism ID,  Bacteria METHICILLIN RESISTANT STAPHYLOCOCCUS AUREUS  Final      Susceptibility   Methicillin resistant staphylococcus aureus - MIC*    CIPROFLOXACIN >=8 RESISTANT Resistant     ERYTHROMYCIN >=8 RESISTANT Resistant     GENTAMICIN <=0.5 SENSITIVE Sensitive  OXACILLIN >=4 RESISTANT Resistant     TETRACYCLINE >=16 RESISTANT Resistant     VANCOMYCIN 1 SENSITIVE Sensitive     TRIMETH/SULFA <=10 SENSITIVE Sensitive     CLINDAMYCIN >=8 RESISTANT Resistant     RIFAMPIN <=0.5 SENSITIVE Sensitive     Inducible Clindamycin NEGATIVE Sensitive     * ABUNDANT METHICILLIN RESISTANT STAPHYLOCOCCUS AUREUS         Radiology Studies: Dg Chest 1 View  Result Date: 06/01/2018 CLINICAL DATA:  PICC line placement. EXAM: CHEST  1 VIEW COMPARISON:  Two-view chest x-ray 05/31/2018 FINDINGS: Right-sided PICC line is been placed. The tip is at the junction of the right subclavian vein and SVC. The heart is enlarged. Atherosclerotic calcifications are present at the aortic arch. Lung volumes are low. Moderate pulmonary vascular congestion is stable. IMPRESSION: 1. PICC line terminates at the junction of the SVC and right subclavian vein. 2. Stable cardiomegaly and moderate pulmonary vascular congestion. Electronically Signed   By: San Morelle M.D.   On: 06/01/2018 04:56   Nm Pulmonary Perf And Vent  Result Date: 05/31/2018 CLINICAL DATA:  Chest pain and productive cough for 3 days EXAM: NUCLEAR MEDICINE PERFUSION LUNG SCAN TECHNIQUE: Perfusion images were obtained in multiple projections after intravenous injection of radiopharmaceutical. RADIOPHARMACEUTICALS:  1.7 mCi Tc-64m MAA IV COMPARISON:  Chest radiograph 05/31/2018 FINDINGS: Due to patient condition, only anterior, LAO and RAO perfusion views could be obtained. Posterior, RPO and LPO perfusion views could not be performed. Obtained perfusion images are normal. IMPRESSION: Limited anterior, LAO and RAO views of perfusion lung scan, with remaining  views unable to be obtained. Obtained perfusion images are normal. Electronically Signed   By: April [name] M.D.   On: 05/31/2018 19:35   Vas [place] Lower Extremity Venous (dvt)  Result Date: 06/01/2018  Lower Venous Study Indications: Pain.  Limitations: Body habitus and poor ultrasound/tissue interface. Performing Technologist: April [name] RVS  Examination Guidelines: A complete evaluation includes B-mode imaging, spectral Doppler, color Doppler, and power Doppler as needed of all accessible portions of each vessel. Bilateral testing is considered an integral part of a complete examination. Limited examinations for reoccurring indications may be performed as noted.  +---------+---------------+---------+-----------+----------+--------------+  LEFT      Compressibility Phasicity Spontaneity Properties Summary         +---------+---------------+---------+-----------+----------+--------------+  CFV       Full            Yes       Yes                                    +---------+---------------+---------+-----------+----------+--------------+  SFJ       Full                                                             +---------+---------------+---------+-----------+----------+--------------+  FV Prox   Full                                                             +---------+---------------+---------+-----------+----------+--------------+  FV  Mid    Full                                                             +---------+---------------+---------+-----------+----------+--------------+  FV Distal Full                                                             +---------+---------------+---------+-----------+----------+--------------+  PFV                                                        Not visualized  +---------+---------------+---------+-----------+----------+--------------+  POP       Full            Yes       Yes                                     +---------+---------------+---------+-----------+----------+--------------+  PTV       Full                                                             +---------+---------------+---------+-----------+----------+--------------+  PERO                                                       Not visualized  +---------+---------------+---------+-----------+----------+--------------+     Summary: Left: There is no evidence of deep vein thrombosis in the lower extremity. However, portions of this examination were limited- see technologist comments above. No cystic structure found in the popliteal fossa.  *See table(s) above for measurements and observations. Electronically signed by Curt Jews MD on 06/01/2018 at 5:17:18 PM.    Final    [place] Ekg Site Rite  Result Date: 06/01/2018 If Site Rite image not attached, placement could not be confirmed due to current cardiac rhythm.       Scheduled Meds:  aspirin EC  81 mg Oral QHS   azithromycin  500 mg Oral Daily   carvedilol  25 mg Oral BID WC   docusate sodium  100 mg Oral BID   famotidine  20 mg Oral BID   feeding supplement (PRO-STAT SUGAR FREE 64)  30 mL Oral TID   furosemide  120 mg Oral BID   guaiFENesin  600 mg Oral BID   insulin aspart  0-20 Units Subcutaneous TID WC   insulin aspart  6 Units Subcutaneous TID WC   mometasone-formoterol  2 puff Inhalation BID   multivitamin with minerals  1 tablet Oral Daily   OXcarbazepine  150 mg Oral BID  potassium chloride SA  60 mEq Oral Daily   simvastatin  40 mg Oral QHS   sodium chloride flush  10-40 mL Intracatheter Q12H   spironolactone  25 mg Oral Daily   Continuous Infusions:  sodium chloride 10 mL/hr at 05/30/18 1315   methocarbamol (ROBAXIN) IV     [START ON 06/03/2018] vancomycin       LOS: 6 days    Time spent: 35 min    Nicolette Bang, MD [facility]  If 7PM-7AM, please contact night-coverage  06/02/2018, 2:23 PM

## 2018-06-02 NOTE — Progress Notes (Signed)
Physical Therapy Treatment Patient Details Name: April [name] MRN: [id number] DOB: 09/24/1950 Today's Date: 06/02/2018    History of Present Illness Pt is a 68 y.o. female admitted 05/27/18 with chronic RLE ulcer, s/p repeat debridement and skin graft placement with VAC placement on 4/24. Pt also with LLE pain, (-) for DVT. PMH includes OA, morbid obesity, DM, CKD3.   PT Comments    Pt slowly progressing with mobility. Requires mod-maxA+2 for standing and taking steps with RW; limited by BLE pain and generalized weakness. Increased time spent discussing pt's plan to return home. Pt will be alone majority of day since family works, stating she will be able to walk around house to get to bathroom, etc. After today's session, pt agreeable to consider SNF for post-acute rehab before returning home. If to return home, will require increased assist for mobility. Pt already owns bariatric-sized electric bed, wheelchair and RW.     Follow Up Recommendations  SNF;Supervision for mobility/OOB     Equipment Recommendations  None recommended by PT    Recommendations for Other Services       Precautions / Restrictions Precautions Precautions: Fall Precaution Comments: RLE wound vac Restrictions Weight Bearing Restrictions: Yes RLE Weight Bearing: Weight bearing as tolerated LLE Weight Bearing: Weight bearing as tolerated    Mobility  Bed Mobility Overal bed mobility: Needs Assistance Bed Mobility: Supine to Sit     Supine to sit: Supervision;HOB elevated     General bed mobility comments: Increased time and effort, but no physical assist required. Reliant on HOB elevated and use of bed rails  Transfers Overall transfer level: Needs assistance Equipment used: Rolling walker (2 wheeled) Transfers: Sit to/from Stand Sit to Stand: Mod assist;Max assist;+2 safety/equipment;From elevated surface         General transfer comment: Unable to stand from low bed height despite maxA, able  to clear bottom from bed but unable to translate weight forward onto BLEs. Was able to stand with modA+2 from raised bed  Ambulation/Gait Ambulation/Gait assistance: +2 safety/equipment;Min assist Gait Distance (Feet): 2 Feet Assistive device: Rolling walker (2 wheeled) Gait Pattern/deviations: Step-to pattern;Wide base of support;Antalgic;Trunk flexed Gait velocity: Decreased   General Gait Details: Took pivotal steps from bed to recliner with bariatric RW and minA+2 to maintain balance. Pt moaning in pain throughout and trunk flexed far forward despite cues   Stairs             Wheelchair Mobility    Modified Rankin (Stroke Patients Only)       Balance Overall balance assessment: Needs assistance Sitting-balance support: Feet supported Sitting balance-Leahy Scale: Fair     Standing balance support: Bilateral upper extremity supported Standing balance-Leahy Scale: Poor Standing balance comment: Reliant on UE support; difficulty achieving full upright posture                            Cognition Arousal/Alertness: Awake/alert Behavior During Therapy: WFL for tasks assessed/performed Overall Cognitive Status: Within Functional Limits for tasks assessed                                 General Comments: WFL, but also with poor insight into deficits and difficult to reason through not being safe to return home      Exercises      General Comments        Pertinent Vitals/Pain Pain Assessment: Faces  Faces Pain Scale: Hurts even more Pain Location: BLE with WB Pain Descriptors / Indicators: Grimacing;Cramping;Moaning Pain Intervention(s): Limited activity within patient's tolerance    Home Living                      Prior Function            PT Goals (current goals can now be found in the care plan section) Acute Rehab PT Goals Patient Stated Goal: May be willing to consider SNF, pt stating, "I just want to know what all  of my options are" PT Goal Formulation: With patient Time For Goal Achievement: 06/11/18 Potential to Achieve Goals: Fair Progress towards PT goals: Progressing toward goals    Frequency    Min 3X/week      PT Plan Current plan remains appropriate    Co-evaluation              AM-PAC PT "6 Clicks" Mobility   Outcome Measure  Help needed turning from your back to your side while in a flat bed without using bedrails?: None Help needed moving from lying on your back to sitting on the side of a flat bed without using bedrails?: None Help needed moving to and from a bed to a chair (including a wheelchair)?: A Lot Help needed standing up from a chair using your arms (e.g., wheelchair or bedside chair)?: A Lot Help needed to walk in hospital room?: A Lot Help needed climbing 3-5 steps with a railing? : Total 6 Click Score: 15    End of Session Equipment Utilized During Treatment: Gait belt Activity Tolerance: Patient limited by pain Patient left: in chair;with call bell/phone within reach Nurse Communication: Mobility status;Need for lift equipment PT Visit Diagnosis: Other abnormalities of gait and mobility (R26.89);Muscle weakness (generalized) (M62.81);Difficulty in walking, not elsewhere classified (R26.2);Pain Pain - Right/Left: Left Pain - part of body: Ankle and joints of foot;Leg     Time: 9480-1655 PT Time Calculation (min) (ACUTE ONLY): 32 min  Charges:  $Therapeutic Activity: 8-22 mins $Self Care/Home Management: 8-22                    [name], PT, DPT Acute Rehabilitation Services  Pager [phone] Office [phone]  Derry Lory 06/02/2018, 11:14 AM

## 2018-06-02 NOTE — Discharge Summary (Signed)
Discharge Diagnoses:  Active Problems:   Leg wound, right, sequela   Surgeries: Procedure(s): RIGHT LOWER LEG DEBRIDEMENT,  SKIN GRAFT, VAC PLACEMENT on 05/27/2018    Consultants:   Discharged Condition: Improved  Hospital Course: April [name] is an 68 y.o. female who was admitted 05/27/2018 with a chief complaint of chronic ulcer right leg, with a final diagnosis of Chronic Ulcer Right Lower Leg.  Patient was brought to the operating room on 05/27/2018 and underwent Procedure(s): RIGHT LOWER LEG DEBRIDEMENT,  SKIN GRAFT, VAC PLACEMENT.    Patient was given perioperative antibiotics:  Anti-infectives (From admission, onward)   Start     Dose/Rate Route Frequency Ordered Stop   06/03/18 0000  vancomycin 2,250 mg in sodium chloride 0.9 % 500 mL     2,250 mg 250 mL/hr over 120 Minutes Intravenous Every 48 hours 06/02/18 0639     06/01/18 1500  vancomycin (VANCOCIN) 2,250 mg in sodium chloride 0.9 % 500 mL IVPB     2,250 mg 250 mL/hr over 120 Minutes Intravenous Every 48 hours 05/30/18 1501     05/31/18 1400  azithromycin (ZITHROMAX) tablet 500 mg     500 mg Oral Daily 05/31/18 1245 06/05/18 0959   05/30/18 1530  vancomycin (VANCOCIN) 2,500 mg in sodium chloride 0.9 % 500 mL IVPB     2,500 mg 250 mL/hr over 120 Minutes Intravenous  Once 05/30/18 1427 05/30/18 1645   05/27/18 1400  clindamycin (CLEOCIN) IVPB 600 mg  Status:  Discontinued     600 mg 100 mL/hr over 30 Minutes Intravenous Every 8 hours 05/27/18 1000 05/31/18 0637   05/27/18 0600  clindamycin (CLEOCIN) IVPB 900 mg     900 mg 100 mL/hr over 30 Minutes Intravenous On call to O.R. 05/27/18 3785 05/27/18 0755    .  Patient was given sequential compression devices, early ambulation, and aspirin for DVT prophylaxis.  Recent vital signs:  Patient Vitals for the past 24 hrs:  BP Temp Temp src Pulse Resp SpO2  06/02/18 0536 114/61 98.6 F (37 C) Oral 72 18 97 %  06/01/18 2109 (!) 128/98 (!) 100.6 F (38.1 C) Oral 78 19 91  %  06/01/18 1418 130/65 99.5 F (37.5 C) Oral 74 18 100 %  .  Recent laboratory studies: Dg Chest 1 View  Result Date: 06/01/2018 CLINICAL DATA:  PICC line placement. EXAM: CHEST  1 VIEW COMPARISON:  Two-view chest x-ray 05/31/2018 FINDINGS: Right-sided PICC line is been placed. The tip is at the junction of the right subclavian vein and SVC. The heart is enlarged. Atherosclerotic calcifications are present at the aortic arch. Lung volumes are low. Moderate pulmonary vascular congestion is stable. IMPRESSION: 1. PICC line terminates at the junction of the SVC and right subclavian vein. 2. Stable cardiomegaly and moderate pulmonary vascular congestion. Electronically Signed   By: San Morelle M.D.   On: 06/01/2018 04:56   Dg Chest 2 View  Result Date: 05/31/2018 CLINICAL DATA:  68 year old female with leg pain and swelling EXAM: CHEST - 2 VIEW COMPARISON:  03/08/2018, 02/15/2018 FINDINGS: Cardiomediastinal silhouette unchanged in size and contour with cardiomegaly. Fullness in the central vasculature. Interlobular septal thickening. Blunting of the bilateral costophrenic angles with opacity at the lung base on the lateral view. Opacity on the lateral view may represent overlying soft tissues of the chest versus airspace opacity. IMPRESSION: Fullness in the central vasculature with possible early edema versus atypical infection. Electronically Signed   By: [name] D.O.   On:  05/31/2018 10:19   Nm Pulmonary Perf And Vent  Result Date: 05/31/2018 CLINICAL DATA:  Chest pain and productive cough for 3 days EXAM: NUCLEAR MEDICINE PERFUSION LUNG SCAN TECHNIQUE: Perfusion images were obtained in multiple projections after intravenous injection of radiopharmaceutical. RADIOPHARMACEUTICALS:  1.7 mCi Tc-84m MAA IV COMPARISON:  Chest radiograph 05/31/2018 FINDINGS: Due to patient condition, only anterior, LAO and RAO perfusion views could be obtained. Posterior, RPO and LPO perfusion views could not  be performed. Obtained perfusion images are normal. IMPRESSION: Limited anterior, LAO and RAO views of perfusion lung scan, with remaining views unable to be obtained. Obtained perfusion images are normal. Electronically Signed   By: [name] M.D.   On: 05/31/2018 19:35   Vas [place] Lower Extremity Venous (dvt)  Result Date: 06/01/2018  Lower Venous Study Indications: Pain.  Limitations: Body habitus and poor ultrasound/tissue interface. Performing Technologist: Abram Sander RVS  Examination Guidelines: A complete evaluation includes B-mode imaging, spectral Doppler, color Doppler, and power Doppler as needed of all accessible portions of each vessel. Bilateral testing is considered an integral part of a complete examination. Limited examinations for reoccurring indications may be performed as noted.  +---------+---------------+---------+-----------+----------+--------------+ LEFT     CompressibilityPhasicitySpontaneityPropertiesSummary        +---------+---------------+---------+-----------+----------+--------------+ CFV      Full           Yes      Yes                                 +---------+---------------+---------+-----------+----------+--------------+ SFJ      Full                                                        +---------+---------------+---------+-----------+----------+--------------+ FV Prox  Full                                                        +---------+---------------+---------+-----------+----------+--------------+ FV Mid   Full                                                        +---------+---------------+---------+-----------+----------+--------------+ FV DistalFull                                                        +---------+---------------+---------+-----------+----------+--------------+ PFV                                                   Not visualized  +---------+---------------+---------+-----------+----------+--------------+ POP      Full           Yes      Yes                                 +---------+---------------+---------+-----------+----------+--------------+  PTV      Full                                                        +---------+---------------+---------+-----------+----------+--------------+ PERO                                                  Not visualized +---------+---------------+---------+-----------+----------+--------------+     Summary: Left: There is no evidence of deep vein thrombosis in the lower extremity. However, portions of this examination were limited- see technologist comments above. No cystic structure found in the popliteal fossa.  *See table(s) above for measurements and observations. Electronically signed by Curt Jews MD on 06/01/2018 at 5:17:18 PM.    Final    [place] Ekg Site Rite  Result Date: 06/01/2018 If Site Rite image not attached, placement could not be confirmed due to current cardiac rhythm.  [place] Ekg Site Rite  Result Date: 05/31/2018 If [facility] image not attached, placement could not be confirmed due to current cardiac rhythm.   Discharge Medications:   Allergies as of 06/02/2018      Reactions   Penicillins Rash, Other (See Comments)   She was told not to take it anymore. Has patient had a PCN reaction causing immediate rash, facial/tongue/throat swelling, SOB or lightheadedness with hypotension: Yes Has patient had a PCN reaction causing severe rash involving mucus membranes or skin necrosis: No Has patient had a PCN reaction that required hospitalization: No Has patient had a PCN reaction occurring within the last 10 years: No If all of the above answers are "NO", then may proceed with Cephalosporin use.'   Shellfish Allergy Anaphylaxis   Sulfonamide Derivatives Anaphylaxis, Other (See Comments)   REACTION: internal "burning"   Hydrochlorothiazide W-triamterene Other  (See Comments)   Hypokalemia   Lotensin [benazepril Hcl] Hives   Dipyridamole Other (See Comments)   Unknown reaction   Estrogens Other (See Comments)   Unknown reaction   Hydrochlorothiazide Other (See Comments)   Unknown reaction   Metronidazole Other (See Comments)   Unknown reaction   Spironolactone Other (See Comments)   UNSPECIFIED > "kidney problems"   Sulfa Antibiotics Other (See Comments)   Unknown reaction   Torsemide Other (See Comments)   Unknown reaction   Valsartan Other (See Comments)   Unknown reaction   Mustard [allyl Isothiocyanate] Itching      Medication List    STOP taking these medications   ciprofloxacin 500 MG tablet Commonly known as:  CIPRO   traMADol 50 MG tablet Commonly known as:  ULTRAM     TAKE these medications   albuterol 108 (90 Base) MCG/ACT inhaler Commonly known as:  Proventil HFA Inhale 2 puffs into the lungs every 4 (four) hours as needed for wheezing or shortness of breath.   albuterol (2.5 MG/3ML) 0.083% nebulizer solution Commonly known as:  PROVENTIL Take 3 mLs (2.5 mg total) by nebulization every 6 (six) hours as needed for wheezing or shortness of breath.   betamethasone dipropionate 0.05 % cream Commonly known as:  DIPROLENE Apply 1 application topically 2 (two) times daily as needed for rash.   budesonide-formoterol 160-4.5 MCG/ACT inhaler Commonly known as:  Symbicort one -  two inhalations every 12 hours; gargle and spit after use What changed:    how much to take  how to take this  when to take this  reasons to take this  additional instructions   calcium carbonate 500 MG chewable tablet Commonly known as:  TUMS - dosed in mg elemental calcium Chew 2 tablets by mouth daily as needed for indigestion or heartburn.   carvedilol 25 MG tablet Commonly known as:  COREG TAKE 1 TABLET BY MOUTH TWICE DAILY WITH A MEAL What changed:  See the new instructions.   COD LIVER OIL PO Take 1 capsule by mouth  daily.   cyanocobalamin 1000 MCG/ML injection Commonly known as:  (VITAMIN B-12) INJECT 1ML INTO MUSCLE EVERY 30 DAYS What changed:    how much to take  how to take this  when to take this  additional instructions   diclofenac sodium 1 % Gel Commonly known as:  VOLTAREN Apply 2 g topically 4 (four) times daily. What changed:    when to take this  reasons to take this   diphenhydrAMINE 12.5 MG/5ML liquid Commonly known as:  BENADRYL Take 25 mg by mouth daily as needed for itching.   docusate sodium 100 MG capsule Commonly known as:  COLACE Take 1 capsule (100 mg total) by mouth 2 (two) times daily. What changed:    when to take this  reasons to take this   Ecotrin Low Strength 81 MG EC tablet Generic drug:  aspirin Take 81 mg by mouth at bedtime.   famotidine 20 MG tablet Commonly known as:  Pepcid Take 1 tablet (20 mg total) by mouth 2 (two) times daily.   feeding supplement (PRO-STAT SUGAR FREE 64) Liqd Take 30 mLs by mouth 3 (three) times daily.   Fish Oil 1000 MG Caps Take 1,000 mg by mouth daily.   FLAX SEEDS PO Take 1 capsule by mouth 2 (two) times a day.   folic acid 509 MCG tablet Commonly known as:  FOLVITE Take 400 mcg by mouth at bedtime.   furosemide 40 MG tablet Commonly known as:  LASIX Take 3 tablets (120 mg total) by mouth 2 (two) times daily.   hydrOXYzine 25 MG tablet Commonly known as:  ATARAX/VISTARIL Take 25 mg by mouth every 6 (six) hours as needed for itching.   multivitamin with minerals Tabs tablet Take 1 tablet by mouth daily.   NEEDLE (DISP) 23 G 23G X 1-1/2" Misc Commonly known as:  Easy Touch FlipLock Needles Use to inject B12 once monthly.   nystatin cream Commonly known as:  MYCOSTATIN Apply 1 application topically 2 (two) times daily. What changed:    when to take this  reasons to take this   onetouch ultrasoft lancets Use to help check blood sugars twice a day Dx E11.9   OXcarbazepine 150 MG  tablet Commonly known as:  TRILEPTAL Take 1 tablet (150 mg total) by mouth 2 (two) times daily.   oxyCODONE-acetaminophen 5-325 MG tablet Commonly known as:  PERCOCET/ROXICET Take 1 tablet by mouth every 4 (four) hours as needed. What changed:  reasons to take this   polyethylene glycol 17 g packet Commonly known as:  MIRALAX / GLYCOLAX Take 17 g by mouth daily as needed for mild constipation.   potassium chloride SA 20 MEQ tablet Commonly known as:  K-DUR TAKE 3 TABLETS BY MOUTH DAILY.   simvastatin 40 MG tablet Commonly known as:  ZOCOR Take 1 tablet (40 mg total) by mouth at bedtime.  spironolactone 25 MG tablet Commonly known as:  ALDACTONE TAKE 1 TABLET BY MOUTH ONCE DAILY   SYRINGE-NEEDLE (DISP) 3 ML 23G X 1" 3 ML Misc Use with B12 to inject IM every 30 days   vancomycin 2,250 mg in sodium chloride 0.9 % 500 mL Inject 2,250 mg into the vein every other day. Start taking on:  Jun 03, 2018       Diagnostic Studies: Dg Chest 1 View  Result Date: 06/01/2018 CLINICAL DATA:  PICC line placement. EXAM: CHEST  1 VIEW COMPARISON:  Two-view chest x-ray 05/31/2018 FINDINGS: Right-sided PICC line is been placed. The tip is at the junction of the right subclavian vein and SVC. The heart is enlarged. Atherosclerotic calcifications are present at the aortic arch. Lung volumes are low. Moderate pulmonary vascular congestion is stable. IMPRESSION: 1. PICC line terminates at the junction of the SVC and right subclavian vein. 2. Stable cardiomegaly and moderate pulmonary vascular congestion. Electronically Signed   By: San Morelle M.D.   On: 06/01/2018 04:56   Dg Chest 2 View  Result Date: 05/31/2018 CLINICAL DATA:  68 year old female with leg pain and swelling EXAM: CHEST - 2 VIEW COMPARISON:  03/08/2018, 02/15/2018 FINDINGS: Cardiomediastinal silhouette unchanged in size and contour with cardiomegaly. Fullness in the central vasculature. Interlobular septal thickening. Blunting  of the bilateral costophrenic angles with opacity at the lung base on the lateral view. Opacity on the lateral view may represent overlying soft tissues of the chest versus airspace opacity. IMPRESSION: Fullness in the central vasculature with possible early edema versus atypical infection. Electronically Signed   By: [name] D.O.   On: 05/31/2018 10:19   Nm Pulmonary Perf And Vent  Result Date: 05/31/2018 CLINICAL DATA:  Chest pain and productive cough for 3 days EXAM: NUCLEAR MEDICINE PERFUSION LUNG SCAN TECHNIQUE: Perfusion images were obtained in multiple projections after intravenous injection of radiopharmaceutical. RADIOPHARMACEUTICALS:  1.7 mCi Tc-80m MAA IV COMPARISON:  Chest radiograph 05/31/2018 FINDINGS: Due to patient condition, only anterior, LAO and RAO perfusion views could be obtained. Posterior, RPO and LPO perfusion views could not be performed. Obtained perfusion images are normal. IMPRESSION: Limited anterior, LAO and RAO views of perfusion lung scan, with remaining views unable to be obtained. Obtained perfusion images are normal. Electronically Signed   By: [name] M.D.   On: 05/31/2018 19:35   Vas [place] Lower Extremity Venous (dvt)  Result Date: 06/01/2018  Lower Venous Study Indications: Pain.  Limitations: Body habitus and poor ultrasound/tissue interface. Performing Technologist: Abram Sander RVS  Examination Guidelines: A complete evaluation includes B-mode imaging, spectral Doppler, color Doppler, and power Doppler as needed of all accessible portions of each vessel. Bilateral testing is considered an integral part of a complete examination. Limited examinations for reoccurring indications may be performed as noted.  +---------+---------------+---------+-----------+----------+--------------+ LEFT     CompressibilityPhasicitySpontaneityPropertiesSummary        +---------+---------------+---------+-----------+----------+--------------+ CFV      Full           Yes       Yes                                 +---------+---------------+---------+-----------+----------+--------------+ SFJ      Full                                                        +---------+---------------+---------+-----------+----------+--------------+  FV Prox  Full                                                        +---------+---------------+---------+-----------+----------+--------------+ FV Mid   Full                                                        +---------+---------------+---------+-----------+----------+--------------+ FV DistalFull                                                        +---------+---------------+---------+-----------+----------+--------------+ PFV                                                   Not visualized +---------+---------------+---------+-----------+----------+--------------+ POP      Full           Yes      Yes                                 +---------+---------------+---------+-----------+----------+--------------+ PTV      Full                                                        +---------+---------------+---------+-----------+----------+--------------+ PERO                                                  Not visualized +---------+---------------+---------+-----------+----------+--------------+     Summary: Left: There is no evidence of deep vein thrombosis in the lower extremity. However, portions of this examination were limited- see technologist comments above. No cystic structure found in the popliteal fossa.  *See table(s) above for measurements and observations. Electronically signed by Curt Jews MD on 06/01/2018 at 5:17:18 PM.    Final    [place] Ekg Site Rite  Result Date: 06/01/2018 If Site Rite image not attached, placement could not be confirmed due to current cardiac rhythm.  [place] Ekg Site Rite  Result Date: 05/31/2018 If [facility] image not attached, placement could not be confirmed  due to current cardiac rhythm.   Patient benefited maximally from their hospital stay and there were no complications.     Disposition:  Discharge Instructions    Negative Pressure Wound Therapy - Incisional   Complete by:  As directed    Attach to portable Pravena pump, show patient how to plug it in     Follow-up Information    [name], MD In 1 week.   Specialty:  Orthopedic Surgery Contact information: [address]  [place] [address] [phone]            Signed: [name] 06/02/2018, 6:41 AM

## 2018-06-02 NOTE — NC FL2 (Addendum)
[place] MEDICAID FL2 LEVEL OF CARE SCREENING TOOL     IDENTIFICATION  Patient Name: April [name] Birthdate: [birth year] Sex: female Admission Date (Current Location): 05/27/2018  [facility] and [place] Number:  Herbalist and Address:  [facility], [place] [address], [place], [place] [address]      Provider Number: [id number]  Attending Physician Name and Address:  [name], MD  Relative Name and Phone Number:   Audine Mangione, spouse, [phone]    Current Level of Care: Hospital Recommended Level of Care: [place] Prior Approval Number:    Date Approved/Denied:   PASRR Number: [id number]  Discharge Plan: SNF    Current Diagnoses: Patient Active Problem List   Diagnosis Date Noted  . Onychomycosis 05/19/2018  . Idiopathic chronic venous hypertension of right lower extremity with ulcer and inflammation ([place]) 05/19/2018  . Traumatic wound dehiscence   . Leg wound, right, sequela 02/18/2018  . Blister 02/18/2018  . Cellulitis of right leg 01/14/2018  . Skin lumps 01/14/2018  . Bilateral leg cramps 12/14/2017  . Dry skin dermatitis 06/29/2017  . Left shoulder pain 08/14/2016  . Meralgia paresthetica 07/16/2016  . Chronic left-sided low back pain with left-sided sciatica 06/02/2016  . Venous stasis ulcer ([place]) 05/13/2016  . Abscess of right lower leg 03/28/2016  . Whole body pain 03/20/2016  . Diabetic peripheral neuropathy ([place]) 03/20/2016  . Osteopenia 01/11/2016  . CKD (chronic kidney disease) stage 3, GFR 30-59 ml/min (HCC) 01/02/2016  . Hand paresthesia 07/18/2015  . Carpal tunnel syndrome 06/07/2015  . Family history of colon cancer   . Diabetes mellitus with neurological manifestations ([place]) 04/18/2015  . Bilateral leg edema 04/11/2015  . Cellulitis of leg, right 04/11/2015  . Abnormality of gait 01/03/2015  . Hereditary and idiopathic peripheral neuropathy 01/03/2015  . GERD (gastroesophageal reflux  disease) 06/17/2014  . Morbid obesity with BMI of 50.0-59.9, adult ([place]) 09/05/2013  . Hx of colonic polyps 12/15/2012  . Vitamin B12 deficiency 11/03/2012  . Intrinsic asthma 03/23/2012  . Chronic diastolic heart failure ([place]) 02/20/2011  . OSA (obstructive sleep apnea) 09/17/2010  . URINARY URGENCY 01/08/2010  . CAD, NATIVE VESSEL 11/20/2008  . OSTEOARTHRITIS, KNEES, BILATERAL, SEVERE 06/14/2008  . Hyperlipidemia 05/10/2007  . Essential hypertension 01/18/2007  . HYPOKALEMIA 04/30/2006  . Body mass index 60.0-69.9, adult ([place]) 04/30/2006  . HX, PERSONAL, PEPTIC ULCER DISEASE 04/30/2006    Orientation RESPIRATION BLADDER Height & Weight     Self, Time, Situation, Place  Normal Continent Weight: (!) 360 lb 0.2 oz (163.3 kg) Height:  5\' 4"  (162.6 cm)  BEHAVIORAL SYMPTOMS/MOOD NEUROLOGICAL BOWEL NUTRITION STATUS      Continent Diet(see discharge summary)  AMBULATORY STATUS COMMUNICATION OF NEEDS Skin   Extensive Assist Verbally Surgical wounds(surgical incision on leg with prevena wound vac at 125)                       Personal Care Assistance Level of Assistance  Bathing, Feeding, Dressing Bathing Assistance: Maximum assistance Feeding assistance: Independent Dressing Assistance: Maximum assistance     Functional Limitations Info  Sight, Hearing, Speech Sight Info: Adequate Hearing Info: Adequate Speech Info: Adequate    SPECIAL CARE FACTORS FREQUENCY  OT (By licensed OT), PT (By licensed PT)     PT Frequency: 5x week OT Frequency: 5x week            Contractures Contractures Info: Not present    Additional Factors  Info  Code Status, Allergies, Insulin Sliding Scale, Psychotropic, Isolation Precautions Code Status Info: Full Code Allergies Info: PENICILLINS, SHELLFISH ALLERGY, SULFONAMIDE DERIVATIVES, HYDROCHLOROTHIAZIDE W-TRIAMTERENE, Lotensin BENAZEPRIL HCL, DIPYRIDAMOLE, ESTROGENS, HYDROCHLOROTHIAZIDE, METRONIDAZOLE, SPIRONOLACTONE, SULFA ANTIBIOTICS,  TORSEMIDE, VALSARTAN, MUSTARD ALLYL ISOTHIOCYANATE  Psychotropic Info: OXcarbazepine (TRILEPTAL) tablet 150 mg 2x daily PO Insulin Sliding Scale Info: insulin aspart (novoLOG) injection 0-20 Units 3x daily with meals; insulin aspart (novoLOG) injection 6 Units 3x daily with meals Isolation Precautions Info: contact precautions     Current Medications (06/02/2018):  This is the current hospital active medication list Current Facility-Administered Medications  Medication Dose Route Frequency Provider Last Rate Last Dose  . 0.9 %  sodium chloride infusion   Intravenous Continuous [name], MD 10 mL/hr at 05/30/18 1315    . acetaminophen (TYLENOL) tablet 325-650 mg  325-650 mg Oral Q6H PRN [name], MD   650 mg at 06/02/18 0847  . albuterol (PROVENTIL) (2.5 MG/3ML) 0.083% nebulizer solution 2.5 mg  2.5 mg Nebulization Q6H PRN [name], MD      . aspirin EC tablet 81 mg  81 mg Oral QHS [name], MD   81 mg at 06/01/18 2100  . azithromycin (ZITHROMAX) tablet 500 mg  500 mg Oral Daily Fuller Plan A, MD   500 mg at 06/02/18 0847  . bisacodyl (DULCOLAX) suppository 10 mg  10 mg Rectal Daily PRN [name], MD      . carvedilol (COREG) tablet 25 mg  25 mg Oral BID WC [name], MD   25 mg at 06/02/18 0846  . docusate sodium (COLACE) capsule 100 mg  100 mg Oral BID [name], MD   100 mg at 06/02/18 0848  . famotidine (PEPCID) tablet 20 mg  20 mg Oral BID [name], MD   20 mg at 06/02/18 0847  . feeding supplement (PRO-STAT SUGAR FREE 64) liquid 30 mL  30 mL Oral TID [name], MD   30 mL at 06/02/18 0845  . furosemide (LASIX) tablet 120 mg  120 mg Oral BID [name], MD   120 mg at 06/02/18 0846  . guaiFENesin (MUCINEX) 12 hr tablet 600 mg  600 mg Oral BID Fuller Plan A, MD   600 mg at 06/02/18 0846  . HYDROmorphone (DILAUDID) injection 0.5-1 mg  0.5-1 mg Intravenous Q4H PRN [name], MD   1 mg at 05/30/18 1949  . hydrOXYzine  (ATARAX/VISTARIL) tablet 25 mg  25 mg Oral Q6H PRN [name], MD   25 mg at 05/29/18 1338  . insulin aspart (novoLOG) injection 0-20 Units  0-20 Units Subcutaneous TID WC [name], MD   4 Units at 05/31/18 1436  . insulin aspart (novoLOG) injection 6 Units  6 Units Subcutaneous TID WC [name], MD   6 Units at 06/01/18 1802  . magnesium citrate solution 1 Bottle  1 Bottle Oral Once PRN [name], MD      . methocarbamol (ROBAXIN) tablet 500 mg  500 mg Oral Q6H PRN [name], MD   500 mg at 06/02/18 0846   Or  . methocarbamol (ROBAXIN) 500 mg in dextrose 5 % 50 mL IVPB  500 mg Intravenous Q6H PRN [name], MD      . metoCLOPramide (REGLAN) tablet 5-10 mg  5-10 mg Oral Q8H PRN [name], MD       Or  . metoCLOPramide (REGLAN) injection 5-10 mg  5-10 mg Intravenous Q8H PRN [name], MD      . mometasone-formoterol [facility]) 200-5 MCG/ACT inhaler 2 puff  2 puff Inhalation BID [name], MD   2 puff at 06/02/18 [phone]  . multivitamin with minerals tablet 1 tablet  1 tablet Oral Daily [name], MD   1 tablet at 06/02/18 [phone]  . ondansetron (ZOFRAN) tablet 4 mg  4 mg Oral Q6H PRN [name], MD       Or  . ondansetron [facility]) injection 4 mg  4 mg Intravenous Q6H PRN [name], MD   4 mg at 05/27/18 1111  . OXcarbazepine (TRILEPTAL) tablet 150 mg  150 mg Oral BID [name], MD   150 mg at 06/01/18 2100  . oxyCODONE (Oxy IR/ROXICODONE) immediate release tablet 10-15 mg  10-15 mg Oral Q4H PRN [name], MD   10 mg at 06/02/18 0630  . oxyCODONE (Oxy IR/ROXICODONE) immediate release tablet 5-10 mg  5-10 mg Oral Q4H PRN [name], MD   10 mg at 06/01/18 2059  . polyethylene glycol (MIRALAX / GLYCOLAX) packet 17 g  17 g Oral Daily PRN [name], MD      . potassium chloride SA (K-DUR) CR tablet 60 mEq  60 mEq Oral Daily [name], MD   60 mEq at 06/02/18 0845  . simvastatin (ZOCOR) tablet 40 mg  40 mg Oral QHS [name], MD   40 mg at 06/01/18 2100  . sodium chloride flush (NS) 0.9 % injection 10-40 mL  10-40 mL Intracatheter Q12H [name], MD      . sodium chloride flush (NS) 0.9 % injection 10-40 mL  10-40 mL Intracatheter PRN [name], MD      . sodium chloride flush (NS) 0.9 % injection 10-40 mL  10-40 mL Intracatheter PRN [name], MD      . spironolactone (ALDACTONE) tablet 25 mg  25 mg Oral Daily [name], MD   25 mg at 06/02/18 0846  . vancomycin (VANCOCIN) 2,250 mg in sodium chloride 0.9 % 500 mL IVPB  2,250 mg Intravenous Q48H Green, Terri L, RPH 250 mL/hr at 06/01/18 1500 2,250 mg at 06/01/18 1500     Discharge Medications: Please see discharge summary for a list of discharge medications.  Relevant Imaging Results:  Relevant Lab Results:   Additional Information SS#: [id number]  Alexander Mt, LCSWA

## 2018-06-02 NOTE — Progress Notes (Signed)
Patient ID: April [name], female   DOB: 1950/12/08, 68 y.o.   MRN: [id number] Plan for discharge to home tomorrow, rx for vanc on the chart and rx sent to pharmacy for percocet and zithromycin, no new drainage in vac, 300 cc in canister

## 2018-06-03 DIAGNOSIS — M25572 Pain in left ankle and joints of left foot: Secondary | ICD-10-CM

## 2018-06-03 LAB — CBC WITH DIFFERENTIAL/PLATELET
Abs Immature Granulocytes: 0.03 10*3/uL (ref 0.00–0.07)
Basophils Absolute: 0.1 10*3/uL (ref 0.0–0.1)
Basophils Relative: 1 %
Eosinophils Absolute: 0.3 10*3/uL (ref 0.0–0.5)
Eosinophils Relative: 5 %
HCT: 36 % (ref 36.0–46.0)
Hemoglobin: 10.9 g/dL — ABNORMAL LOW (ref 12.0–15.0)
Immature Granulocytes: 1 %
Lymphocytes Relative: 26 %
Lymphs Abs: 1.3 10*3/uL (ref 0.7–4.0)
MCH: 27.8 pg (ref 26.0–34.0)
MCHC: 30.3 g/dL (ref 30.0–36.0)
MCV: 91.8 fL (ref 80.0–100.0)
Monocytes Absolute: 0.4 10*3/uL (ref 0.1–1.0)
Monocytes Relative: 7 %
Neutro Abs: 3.1 10*3/uL (ref 1.7–7.7)
Neutrophils Relative %: 60 %
Platelets: 207 10*3/uL (ref 150–400)
RBC: 3.92 MIL/uL (ref 3.87–5.11)
RDW: 15.4 % (ref 11.5–15.5)
WBC: 5.1 10*3/uL (ref 4.0–10.5)
nRBC: 0 % (ref 0.0–0.2)

## 2018-06-03 LAB — CREATININE, SERUM
Creatinine, Ser: 1.32 mg/dL — ABNORMAL HIGH (ref 0.44–1.00)
GFR calc Af Amer: 48 mL/min — ABNORMAL LOW (ref >60)
GFR calc non Af Amer: 42 mL/min — ABNORMAL LOW (ref >60)

## 2018-06-03 LAB — GLUCOSE, CAPILLARY
Glucose-Capillary: 114 mg/dL — ABNORMAL HIGH (ref 70–99)
Glucose-Capillary: 114 mg/dL — ABNORMAL HIGH (ref 70–99)

## 2018-06-03 LAB — VANCOMYCIN, TROUGH: Vancomycin Tr: 12 ug/mL — ABNORMAL LOW (ref 15–20)

## 2018-06-03 MED ORDER — HEPARIN SOD (PORK) LOCK FLUSH 100 UNIT/ML IV SOLN
250.0000 [IU] | INTRAVENOUS | Status: AC | PRN
  Administered 2018-06-03: 250 [IU]

## 2018-06-03 MED ORDER — VANCOMYCIN HCL 10 G IV SOLR: 1500.0000 mg | INTRAVENOUS | 0 refills | Status: AC

## 2018-06-03 NOTE — Progress Notes (Signed)
Subjective: 7 Days Post-Op Procedure(s) (LRB): RIGHT LOWER LEG DEBRIDEMENT,  SKIN GRAFT, VAC PLACEMENT (Right) Patient is afebrile this am and t max 99.4 over the past 24 hours. Wbc 5.1 Will plan for DC home today .   Objective: Vital signs in last 24 hours: Temp:  [97.7 F (36.5 C)-99.4 F (37.4 C)] 98 F (36.7 C) (05/01 0506) Pulse Rate:  [73-85] 76 (05/01 0506) Resp:  [17-18] 18 (05/01 0506) BP: (108-124)/(55-65) 117/56 (05/01 0506) SpO2:  [89 %-94 %] 94 % (05/01 0506)  Intake/Output from previous day: 04/30 0701 - 05/01 0700 In: 1210 [P.O.:1200; I.V.:10] Out: 1525 [Urine:1500; Drains:25] Intake/Output this shift: Total I/O In: 250 [P.O.:240; I.V.:10] Out: 500 [Urine:500]  Recent Labs    06/03/18 0510  HGB 10.9*   Recent Labs    06/03/18 0510  WBC 5.1  RBC 3.92  HCT 36.0  PLT 207   Recent Labs    06/02/18 0433 06/03/18 0510  CREATININE 1.37* 1.32*   No results for input(s): LABPT, INR in the last 72 hours.  VAC dressing in place and canister with 350 cc drainage.    Assessment/Plan: 7 Days Post-Op Procedure(s) (LRB): RIGHT LOWER LEG DEBRIDEMENT,  SKIN GRAFT, VAC PLACEMENT (Right) Plan for DC home today with Prevena VAC DC on VANC IV and prescriptions already sent for Percocet, Zithromax.  Will have her follow up in the office next week.   Erlinda Hong, PA-C 06/03/2018, 6:53 AM  [place]

## 2018-06-03 NOTE — Progress Notes (Signed)
PROGRESS NOTE    April [name]  ZOX:[id number] DOB: [birth year] DOA: 05/27/2018 PCP: [name], MD   Brief Narrative:  April [name] a 68 y.o.femalewith medical history significant ofHTN, diastolic CHF, DM type II, CKD, chronic right lower extremity ulcer requiring several debridements in the past; who presented with complaints of increased drainage from the right leg ulcer. Patient was taken for repeat debridement on 4/24, and cultures have resulted positive for MRSA. Patient placed on antibiotics of vancomycin. Complains of coughing up yellow mucus with discomfort in her chest over the last 3 days. Chest discomfort is worsened somewhat with deep inspiration. Associated symptoms include acute pain on the dorsal aspect of her left foot which she states was her good leg. Denies having any fevers, recent trauma, or fall.   Assessment & Plan:   Active Problems:   Leg wound, right, sequela   Cough with questionable bronchitis with mild fever x1.   As previously noted, VQ scan unremarkable, agree with continue incentive spirometry nebs. I wouldcomplete a course of azithromycin x5 days today is day #4.  Patient has improved significantly and has no respiratory compromise at all  Left foot pain.. No further work-up appears to been resolved.  As previously noted Dopplers performed and negative although limited by body habitus. Orthopedics is primary will defer any further evaluation to orthopedics  History congestive heart failure with diastolic dysfunction with most recent EF of 60 to 70%. Continue with home medications which include Lasix, 20 mg p.o. twice daily and spironolactone 25 mg daily. Patient is also on hydralazine which I will continue. She is not grossly overloaded at this point time  Right leg ulcer possible MRSA. Patient currently is on vancomycin defer antibiotics and treatment to orthopedics.   Patient had a fever for which a UA was checked which was  negative, CBC was normal, patient had no additional fevers over the last 24 hours.  Blood cultures were collected and pending.  Primary team follow blood cultures.  Patient will continue antibiotics per primary team  DVT prophylaxis: Per primary team Code Status: Full    Code Status Orders  (From admission, onward)         Start     Ordered   05/27/18 1001  Full code  Continuous     05/27/18 1000        Code Status History    Date Active Date Inactive Code Status Order ID Comments User Context   03/30/2018 1725 04/05/2018 1855 Full Code [id number]  [name], MD Inpatient   03/02/2018 1323 03/09/2018 1831 Full Code [id number]  [name], MD Inpatient   03/28/2016 1749 04/02/2016 1851 Full Code [id number]  [name], MD Inpatient   04/11/2015 2206 04/16/2015 1834 Full Code [id number]  [name], MD Inpatient     Family Communication:  Per primary team  Disposition Plan:  Discharge disposition: 01-Home or [facility] called:  Per primary team Admission status: Inpatient   Consultants:    Per primary team  Procedures:  Dg Chest 1 View  Result Date: 06/01/2018 CLINICAL DATA:  PICC line placement. EXAM: CHEST  1 VIEW COMPARISON:  Two-view chest x-ray 05/31/2018 FINDINGS: Right-sided PICC line is been placed. The tip is at the junction of the right subclavian vein and SVC. The heart is enlarged. Atherosclerotic calcifications are present at the aortic arch. Lung volumes are low. Moderate pulmonary vascular congestion is stable. IMPRESSION: 1. PICC line  terminates at the junction of the SVC and right subclavian vein. 2. Stable cardiomegaly and moderate pulmonary vascular congestion. Electronically Signed   By: San Morelle M.D.   On: 06/01/2018 04:56   Dg Chest 2 View  Result Date: 05/31/2018 CLINICAL DATA:  68 year old female with leg pain and swelling EXAM: CHEST - 2 VIEW COMPARISON:  03/08/2018, 02/15/2018 FINDINGS: Cardiomediastinal silhouette  unchanged in size and contour with cardiomegaly. Fullness in the central vasculature. Interlobular septal thickening. Blunting of the bilateral costophrenic angles with opacity at the lung base on the lateral view. Opacity on the lateral view may represent overlying soft tissues of the chest versus airspace opacity. IMPRESSION: Fullness in the central vasculature with possible early edema versus atypical infection. Electronically Signed   By: [name] D.O.   On: 05/31/2018 10:19   Nm Pulmonary Perfusion  Result Date: 05/31/2018 CLINICAL DATA:  Chest pain and productive cough for 3 days EXAM: NUCLEAR MEDICINE PERFUSION LUNG SCAN TECHNIQUE: Perfusion images were obtained in multiple projections after intravenous injection of radiopharmaceutical. RADIOPHARMACEUTICALS:  1.7 mCi Tc-56m MAA IV COMPARISON:  Chest radiograph 05/31/2018 FINDINGS: Due to patient condition, only anterior, LAO and RAO perfusion views could be obtained. Posterior, RPO and LPO perfusion views could not be performed. Obtained perfusion images are normal. IMPRESSION: Limited anterior, LAO and RAO views of perfusion lung scan, with remaining views unable to be obtained. Obtained perfusion images are normal. Electronically Signed   By: [name] M.D.   On: 05/31/2018 19:35   Vas [place] Lower Extremity Venous (dvt)  Result Date: 06/01/2018  Lower Venous Study Indications: Pain.  Limitations: Body habitus and poor ultrasound/tissue interface. Performing Technologist: Abram Sander RVS  Examination Guidelines: A complete evaluation includes B-mode imaging, spectral Doppler, color Doppler, and power Doppler as needed of all accessible portions of each vessel. Bilateral testing is considered an integral part of a complete examination. Limited examinations for reoccurring indications may be performed as noted.  +---------+---------------+---------+-----------+----------+--------------+ LEFT      CompressibilityPhasicitySpontaneityPropertiesSummary        +---------+---------------+---------+-----------+----------+--------------+ CFV      Full           Yes      Yes                                 +---------+---------------+---------+-----------+----------+--------------+ SFJ      Full                                                        +---------+---------------+---------+-----------+----------+--------------+ FV Prox  Full                                                        +---------+---------------+---------+-----------+----------+--------------+ FV Mid   Full                                                        +---------+---------------+---------+-----------+----------+--------------+ FV DistalFull                                                        +---------+---------------+---------+-----------+----------+--------------+  PFV                                                   Not visualized +---------+---------------+---------+-----------+----------+--------------+ POP      Full           Yes      Yes                                 +---------+---------------+---------+-----------+----------+--------------+ PTV      Full                                                        +---------+---------------+---------+-----------+----------+--------------+ PERO                                                  Not visualized +---------+---------------+---------+-----------+----------+--------------+     Summary: Left: There is no evidence of deep vein thrombosis in the lower extremity. However, portions of this examination were limited- see technologist comments above. No cystic structure found in the popliteal fossa.  *See table(s) above for measurements and observations. Electronically signed by Curt Jews MD on 06/01/2018 at 5:17:18 PM.    Final    [place] Ekg Site Rite  Result Date: 06/01/2018 If Site Rite image not attached,  placement could not be confirmed due to current cardiac rhythm.  [place] Ekg Site Rite  Result Date: 05/31/2018 If [facility] image not attached, placement could not be confirmed due to current cardiac rhythm.    Antimicrobials:   Azithromycin day 4 of 5  Vancomycin per primary team   Subjective: Patient reports she feels well overnight, has declined skilled nursing facility.  Primary team to discharge likely today  Objective: Vitals:   06/02/18 1337 06/02/18 1956 06/02/18 2132 06/03/18 0506  BP: 108/65  (!) 124/55 (!) 117/56  Pulse: 85  73 76  Resp: 18  17 18   Temp: 99.4 F (37.4 C)  97.7 F (36.5 C) 98 F (36.7 C)  TempSrc: Oral  Oral Oral  SpO2: 91% 93% (!) 89% 94%  Weight:      Height:        Intake/Output Summary (Last 24 hours) at 06/03/2018 1036 Last data filed at 06/03/2018 0745 Gross per 24 hour  Intake 990.61 ml  Output 1525 ml  Net -534.39 ml   Filed Weights   05/27/18 0555  Weight: (!) 163.3 kg    Examination:  General exam: Appears calm and comfortable  Respiratory system: Clear to auscultation. Respiratory effort normal.  No rhonchi Cardiovascular system: S1 & S2 heard, RRR. No JVD, murmurs, rubs, gallops or clicks. No pedal edema. Gastrointestinal system: Abdomen is nondistended, soft and nontender. No organomegaly or masses felt. Normal bowel sounds heard. Central nervous system: Alert and oriented. No focal neurological deficits. Extremities: Neurovascularly intact, VAC in place Skin: No rashes, lesions or ulcers with findings as above Psychiatry: Judgement and insight appear normal. Mood & affect appropriate.     Data Reviewed: I have personally reviewed following  labs and imaging studies  CBC: Recent Labs  Lab 06/03/18 0510  WBC 5.1  NEUTROABS 3.1  HGB 10.9*  HCT 36.0  MCV 91.8  PLT 144   Basic Metabolic Panel: Recent Labs  Lab 05/31/18 0207 06/01/18 0422 06/02/18 0433 06/03/18 0510  CREATININE 1.58* 1.42* 1.37* 1.32*   GFR:  Estimated Creatinine Clearance: 64 mL/min (A) (by C-G formula based on SCr of 1.32 mg/dL (H)). Liver Function Tests: No results for input(s): AST, ALT, ALKPHOS, BILITOT, PROT, ALBUMIN in the last 168 hours. No results for input(s): LIPASE, AMYLASE in the last 168 hours. No results for input(s): AMMONIA in the last 168 hours. Coagulation Profile: No results for input(s): INR, PROTIME in the last 168 hours. Cardiac Enzymes: No results for input(s): CKTOTAL, CKMB, CKMBINDEX, TROPONINI in the last 168 hours. BNP (last 3 results) No results for input(s): PROBNP in the last 8760 hours. HbA1C: No results for input(s): HGBA1C in the last 72 hours. CBG: Recent Labs  Lab 06/02/18 0723 06/02/18 1119 06/02/18 1532 06/02/18 2107 06/03/18 0738  GLUCAP 101* 159* 86 96 114*   Lipid Profile: No results for input(s): CHOL, HDL, LDLCALC, TRIG, CHOLHDL, LDLDIRECT in the last 72 hours. Thyroid Function Tests: No results for input(s): TSH, T4TOTAL, FREET4, T3FREE, THYROIDAB in the last 72 hours. Anemia Panel: No results for input(s): VITAMINB12, FOLATE, FERRITIN, TIBC, IRON, RETICCTPCT in the last 72 hours. Sepsis Labs: No results for input(s): PROCALCITON, LATICACIDVEN in the last 168 hours.  Recent Results (from the past 240 hour(s))  Aerobic/Anaerobic Culture (surgical/deep wound)     Status: None   Collection Time: 05/27/18  8:02 AM  Result Value Ref Range Status   Specimen Description TISSUE RIGHT LEG  Final   Special Requests PATIENT ON FOLLOWING CLINDAMYCIN  Final   Gram Stain   Final    FEW WBC PRESENT, PREDOMINANTLY PMN MODERATE GRAM POSITIVE RODS RARE GRAM POSITIVE COCCI IN PAIRS    Culture   Final    ABUNDANT METHICILLIN RESISTANT STAPHYLOCOCCUS AUREUS ABUNDANT CORYNEBACTERIUM STRIATUM Standardized susceptibility testing for this organism is not available. NO ANAEROBES ISOLATED Performed at [facility], [place] [address] [place], [place] [address]    Report Status  06/01/2018 FINAL  Final   Organism ID, Bacteria METHICILLIN RESISTANT STAPHYLOCOCCUS AUREUS  Final      Susceptibility   Methicillin resistant staphylococcus aureus - MIC*    CIPROFLOXACIN >=8 RESISTANT Resistant     ERYTHROMYCIN >=8 RESISTANT Resistant     GENTAMICIN <=0.5 SENSITIVE Sensitive     OXACILLIN >=4 RESISTANT Resistant     TETRACYCLINE >=16 RESISTANT Resistant     VANCOMYCIN 1 SENSITIVE Sensitive     TRIMETH/SULFA <=10 SENSITIVE Sensitive     CLINDAMYCIN >=8 RESISTANT Resistant     RIFAMPIN <=0.5 SENSITIVE Sensitive     Inducible Clindamycin NEGATIVE Sensitive     * ABUNDANT METHICILLIN RESISTANT STAPHYLOCOCCUS AUREUS  Culture, blood (routine x 2)     Status: None (Preliminary result)   Collection Time: 06/02/18  9:56 AM  Result Value Ref Range Status   Specimen Description BLOOD LEFT HAND  Final   Special Requests   Final    AEROBIC BOTTLE ONLY Blood Culture adequate volume Performed at [facility], [address], [place], [place] [address]    Culture PENDING  Incomplete   Report Status PENDING  Incomplete         Radiology Studies: Vas [place] Lower Extremity Venous (dvt)  Result Date: 06/01/2018  Lower  Venous Study Indications: Pain.  Limitations: Body habitus and poor ultrasound/tissue interface. Performing Technologist: Abram Sander RVS  Examination Guidelines: A complete evaluation includes B-mode imaging, spectral Doppler, color Doppler, and power Doppler as needed of all accessible portions of each vessel. Bilateral testing is considered an integral part of a complete examination. Limited examinations for reoccurring indications may be performed as noted.  +---------+---------------+---------+-----------+----------+--------------+ LEFT     CompressibilityPhasicitySpontaneityPropertiesSummary        +---------+---------------+---------+-----------+----------+--------------+ CFV      Full           Yes      Yes                                  +---------+---------------+---------+-----------+----------+--------------+ SFJ      Full                                                        +---------+---------------+---------+-----------+----------+--------------+ FV Prox  Full                                                        +---------+---------------+---------+-----------+----------+--------------+ FV Mid   Full                                                        +---------+---------------+---------+-----------+----------+--------------+ FV DistalFull                                                        +---------+---------------+---------+-----------+----------+--------------+ PFV                                                   Not visualized +---------+---------------+---------+-----------+----------+--------------+ POP      Full           Yes      Yes                                 +---------+---------------+---------+-----------+----------+--------------+ PTV      Full                                                        +---------+---------------+---------+-----------+----------+--------------+ PERO  Not visualized +---------+---------------+---------+-----------+----------+--------------+     Summary: Left: There is no evidence of deep vein thrombosis in the lower extremity. However, portions of this examination were limited- see technologist comments above. No cystic structure found in the popliteal fossa.  *See table(s) above for measurements and observations. Electronically signed by Curt Jews MD on 06/01/2018 at 5:17:18 PM.    Final         Scheduled Meds: . aspirin EC  81 mg Oral QHS  . azithromycin  500 mg Oral Daily  . carvedilol  25 mg Oral BID WC  . docusate sodium  100 mg Oral BID  . famotidine  20 mg Oral BID  . feeding supplement (PRO-STAT SUGAR FREE 64)  30 mL Oral TID  . furosemide  120 mg Oral BID  .  guaiFENesin  600 mg Oral BID  . insulin aspart  0-20 Units Subcutaneous TID WC  . insulin aspart  6 Units Subcutaneous TID WC  . mometasone-formoterol  2 puff Inhalation BID  . multivitamin with minerals  1 tablet Oral Daily  . OXcarbazepine  150 mg Oral BID  . potassium chloride SA  60 mEq Oral Daily  . simvastatin  40 mg Oral QHS  . sodium chloride flush  10-40 mL Intracatheter Q12H  . spironolactone  25 mg Oral Daily   Continuous Infusions: . sodium chloride 10 mL/hr at 05/30/18 1315  . methocarbamol (ROBAXIN) IV    . vancomycin       LOS: 7 days    Time spent: 35 min    Nicolette Bang, MD [facility]  If 7PM-7AM, please contact night-coverage  06/03/2018, 10:36 AM

## 2018-06-03 NOTE — Progress Notes (Signed)
Pt alert, able to make needs known. Sitting up in chair without obvious signs of distress. C/o being cold, asked for more covers, water, coffee and a towel to cover herself when she eats. Pt was informed of pending discharge, but I let her know that it would be somewhere closer to lunch because her IV antibiotic had to run first. Pt was okay with that and thanked me for keeping her updated. Continue to monitor.

## 2018-06-03 NOTE — Discharge Summary (Addendum)
Discharge Diagnoses:  Active Problems:   Leg wound, right, sequela   Surgeries: Procedure(s): RIGHT LOWER LEG DEBRIDEMENT,  SKIN GRAFT, VAC PLACEMENT on 05/27/2018    Consultants:   Discharged Condition: Improved  Hospital Course: April [name] is an 68 y.o. female who was admitted 05/27/2018 with a chief complaint of infection right lower leg wound, with a final diagnosis of Chronic Ulcer Right Lower Leg.  Patient was brought to the operating room on 05/27/2018 and underwent Procedure(s): RIGHT LOWER LEG DEBRIDEMENT,  SKIN GRAFT, VAC PLACEMENT.    Patient was given perioperative antibiotics:  Anti-infectives (From admission, onward)   Start     Dose/Rate Route Frequency Ordered Stop   06/03/18 1000  vancomycin (VANCOCIN) 1,500 mg in sodium chloride 0.9 % 500 mL IVPB     1,500 mg 250 mL/hr over 120 Minutes Intravenous Every 24 hours 06/02/18 1133     06/03/18 0000  vancomycin 2,250 mg in sodium chloride 0.9 % 500 mL  Status:  Discontinued     2,250 mg 250 mL/hr over 120 Minutes Intravenous Every 48 hours 06/02/18 0639 06/03/18    06/03/18 0000  vancomycin 1,500 mg in sodium chloride 0.9 % 500 mL     1,500 mg 250 mL/hr over 120 Minutes Intravenous Every 24 hours 06/03/18 1041 06/25/18 2359   06/02/18 0000  azithromycin (ZITHROMAX Z-PAK) 250 MG tablet        06/02/18 0716 06/07/18 2359   06/01/18 1500  vancomycin (VANCOCIN) 2,250 mg in sodium chloride 0.9 % 500 mL IVPB  Status:  Discontinued     2,250 mg 250 mL/hr over 120 Minutes Intravenous Every 48 hours 05/30/18 1501 06/02/18 1133   05/31/18 1400  azithromycin (ZITHROMAX) tablet 500 mg     500 mg Oral Daily 05/31/18 1245 06/05/18 0959   05/30/18 1530  vancomycin (VANCOCIN) 2,500 mg in sodium chloride 0.9 % 500 mL IVPB     2,500 mg 250 mL/hr over 120 Minutes Intravenous  Once 05/30/18 1427 05/30/18 1645   05/27/18 1400  clindamycin (CLEOCIN) IVPB 600 mg  Status:  Discontinued     600 mg 100 mL/hr over 30 Minutes Intravenous  Every 8 hours 05/27/18 1000 05/31/18 0637   05/27/18 0600  clindamycin (CLEOCIN) IVPB 900 mg     900 mg 100 mL/hr over 30 Minutes Intravenous On call to O.R. 05/27/18 2831 05/27/18 0755    .  Patient was given sequential compression devices, early ambulation, and aspirin for DVT prophylaxis.  Recent vital signs:  Patient Vitals for the past 24 hrs:  BP Temp Temp src Pulse Resp SpO2  06/03/18 0506 (!) 117/56 98 F (36.7 C) Oral 76 18 94 %  06/02/18 2132 (!) 124/55 97.7 F (36.5 C) Oral 73 17 (!) 89 %  06/02/18 1956 - - - - - 93 %  06/02/18 1337 108/65 99.4 F (37.4 C) Oral 85 18 91 %  .  Recent laboratory studies: No results found.  Discharge Medications:   Allergies as of 06/03/2018      Reactions   Penicillins Rash, Other (See Comments)   She was told not to take it anymore. Has patient had a PCN reaction causing immediate rash, facial/tongue/throat swelling, SOB or lightheadedness with hypotension: Yes Has patient had a PCN reaction causing severe rash involving mucus membranes or skin necrosis: No Has patient had a PCN reaction that required hospitalization: No Has patient had a PCN reaction occurring within the last 10 years: No If all of the above  answers are "NO", then may proceed with Cephalosporin use.'   Shellfish Allergy Anaphylaxis   Sulfonamide Derivatives Anaphylaxis, Other (See Comments)   REACTION: internal "burning"   Hydrochlorothiazide W-triamterene Other (See Comments)   Hypokalemia   Lotensin [benazepril Hcl] Hives   Dipyridamole Other (See Comments)   Unknown reaction   Estrogens Other (See Comments)   Unknown reaction   Hydrochlorothiazide Other (See Comments)   Unknown reaction   Metronidazole Other (See Comments)   Unknown reaction   Spironolactone Other (See Comments)   UNSPECIFIED > "kidney problems"   Sulfa Antibiotics Other (See Comments)   Unknown reaction   Torsemide Other (See Comments)   Unknown reaction   Valsartan Other (See  Comments)   Unknown reaction   Mustard [allyl Isothiocyanate] Itching      Medication List    STOP taking these medications   ciprofloxacin 500 MG tablet Commonly known as:  CIPRO   traMADol 50 MG tablet Commonly known as:  ULTRAM     TAKE these medications   albuterol 108 (90 Base) MCG/ACT inhaler Commonly known as:  Proventil HFA Inhale 2 puffs into the lungs every 4 (four) hours as needed for wheezing or shortness of breath.   albuterol (2.5 MG/3ML) 0.083% nebulizer solution Commonly known as:  PROVENTIL Take 3 mLs (2.5 mg total) by nebulization every 6 (six) hours as needed for wheezing or shortness of breath.   azithromycin 250 MG tablet Commonly known as:  Zithromax Z-Pak Take 2 tablets (500 mg) on  Day 1,  followed by 1 tablet (250 mg) once daily on Days 2 through 5.   betamethasone dipropionate 0.05 % cream Commonly known as:  DIPROLENE Apply 1 application topically 2 (two) times daily as needed for rash.   budesonide-formoterol 160-4.5 MCG/ACT inhaler Commonly known as:  Symbicort one - two inhalations every 12 hours; gargle and spit after use What changed:    how much to take  how to take this  when to take this  reasons to take this  additional instructions   calcium carbonate 500 MG chewable tablet Commonly known as:  TUMS - dosed in mg elemental calcium Chew 2 tablets by mouth daily as needed for indigestion or heartburn.   carvedilol 25 MG tablet Commonly known as:  COREG TAKE 1 TABLET BY MOUTH TWICE DAILY WITH A MEAL What changed:  See the new instructions.   COD LIVER OIL PO Take 1 capsule by mouth daily.   cyanocobalamin 1000 MCG/ML injection Commonly known as:  (VITAMIN B-12) INJECT 1ML INTO MUSCLE EVERY 30 DAYS What changed:    how much to take  how to take this  when to take this  additional instructions   diclofenac sodium 1 % Gel Commonly known as:  VOLTAREN Apply 2 g topically 4 (four) times daily. What changed:     when to take this  reasons to take this   diphenhydrAMINE 12.5 MG/5ML liquid Commonly known as:  BENADRYL Take 25 mg by mouth daily as needed for itching.   docusate sodium 100 MG capsule Commonly known as:  COLACE Take 1 capsule (100 mg total) by mouth 2 (two) times daily. What changed:    when to take this  reasons to take this   Ecotrin Low Strength 81 MG EC tablet Generic drug:  aspirin Take 81 mg by mouth at bedtime.   famotidine 20 MG tablet Commonly known as:  Pepcid Take 1 tablet (20 mg total) by mouth 2 (two) times daily.  feeding supplement (PRO-STAT SUGAR FREE 64) Liqd Take 30 mLs by mouth 3 (three) times daily.   Fish Oil 1000 MG Caps Take 1,000 mg by mouth daily.   FLAX SEEDS PO Take 1 capsule by mouth 2 (two) times a day.   folic acid 427 MCG tablet Commonly known as:  FOLVITE Take 400 mcg by mouth at bedtime.   furosemide 40 MG tablet Commonly known as:  LASIX Take 3 tablets (120 mg total) by mouth 2 (two) times daily.   hydrOXYzine 25 MG tablet Commonly known as:  ATARAX/VISTARIL Take 25 mg by mouth every 6 (six) hours as needed for itching.   multivitamin with minerals Tabs tablet Take 1 tablet by mouth daily.   NEEDLE (DISP) 23 G 23G X 1-1/2" Misc Commonly known as:  Easy Touch FlipLock Needles Use to inject B12 once monthly.   nystatin cream Commonly known as:  MYCOSTATIN Apply 1 application topically 2 (two) times daily. What changed:    when to take this  reasons to take this   onetouch ultrasoft lancets Use to help check blood sugars twice a day Dx E11.9   OXcarbazepine 150 MG tablet Commonly known as:  TRILEPTAL Take 1 tablet (150 mg total) by mouth 2 (two) times daily.   oxyCODONE-acetaminophen 5-325 MG tablet Commonly known as:  PERCOCET/ROXICET Take 1 tablet by mouth every 4 (four) hours as needed. What changed:  reasons to take this   polyethylene glycol 17 g packet Commonly known as:  MIRALAX / GLYCOLAX Take  17 g by mouth daily as needed for mild constipation.   potassium chloride SA 20 MEQ tablet Commonly known as:  K-DUR TAKE 3 TABLETS BY MOUTH DAILY.   simvastatin 40 MG tablet Commonly known as:  ZOCOR Take 1 tablet (40 mg total) by mouth at bedtime.   spironolactone 25 MG tablet Commonly known as:  ALDACTONE TAKE 1 TABLET BY MOUTH ONCE DAILY   SYRINGE-NEEDLE (DISP) 3 ML 23G X 1" 3 ML Misc Use with B12 to inject IM every 30 days   vancomycin 1,500 mg in sodium chloride 0.9 % 500 mL Inject 1,500 mg into the vein daily for 22 days.       Diagnostic Studies: Dg Chest 1 View  Result Date: 06/01/2018 CLINICAL DATA:  PICC line placement. EXAM: CHEST  1 VIEW COMPARISON:  Two-view chest x-ray 05/31/2018 FINDINGS: Right-sided PICC line is been placed. The tip is at the junction of the right subclavian vein and SVC. The heart is enlarged. Atherosclerotic calcifications are present at the aortic arch. Lung volumes are low. Moderate pulmonary vascular congestion is stable. IMPRESSION: 1. PICC line terminates at the junction of the SVC and right subclavian vein. 2. Stable cardiomegaly and moderate pulmonary vascular congestion. Electronically Signed   By: San Morelle M.D.   On: 06/01/2018 04:56   Dg Chest 2 View  Result Date: 05/31/2018 CLINICAL DATA:  68 year old female with leg pain and swelling EXAM: CHEST - 2 VIEW COMPARISON:  03/08/2018, 02/15/2018 FINDINGS: Cardiomediastinal silhouette unchanged in size and contour with cardiomegaly. Fullness in the central vasculature. Interlobular septal thickening. Blunting of the bilateral costophrenic angles with opacity at the lung base on the lateral view. Opacity on the lateral view may represent overlying soft tissues of the chest versus airspace opacity. IMPRESSION: Fullness in the central vasculature with possible early edema versus atypical infection. Electronically Signed   By: [name] D.O.   On: 05/31/2018 10:19   Nm Pulmonary  Perfusion  Result Date:  05/31/2018 CLINICAL DATA:  Chest pain and productive cough for 3 days EXAM: NUCLEAR MEDICINE PERFUSION LUNG SCAN TECHNIQUE: Perfusion images were obtained in multiple projections after intravenous injection of radiopharmaceutical. RADIOPHARMACEUTICALS:  1.7 mCi Tc-27m MAA IV COMPARISON:  Chest radiograph 05/31/2018 FINDINGS: Due to patient condition, only anterior, LAO and RAO perfusion views could be obtained. Posterior, RPO and LPO perfusion views could not be performed. Obtained perfusion images are normal. IMPRESSION: Limited anterior, LAO and RAO views of perfusion lung scan, with remaining views unable to be obtained. Obtained perfusion images are normal. Electronically Signed   By: [name] M.D.   On: 05/31/2018 19:35   Vas [place] Lower Extremity Venous (dvt)  Result Date: 06/01/2018  Lower Venous Study Indications: Pain.  Limitations: Body habitus and poor ultrasound/tissue interface. Performing Technologist: Abram Sander RVS  Examination Guidelines: A complete evaluation includes B-mode imaging, spectral Doppler, color Doppler, and power Doppler as needed of all accessible portions of each vessel. Bilateral testing is considered an integral part of a complete examination. Limited examinations for reoccurring indications may be performed as noted.  +---------+---------------+---------+-----------+----------+--------------+ LEFT     CompressibilityPhasicitySpontaneityPropertiesSummary        +---------+---------------+---------+-----------+----------+--------------+ CFV      Full           Yes      Yes                                 +---------+---------------+---------+-----------+----------+--------------+ SFJ      Full                                                        +---------+---------------+---------+-----------+----------+--------------+ FV Prox  Full                                                         +---------+---------------+---------+-----------+----------+--------------+ FV Mid   Full                                                        +---------+---------------+---------+-----------+----------+--------------+ FV DistalFull                                                        +---------+---------------+---------+-----------+----------+--------------+ PFV                                                   Not visualized +---------+---------------+---------+-----------+----------+--------------+ POP      Full           Yes      Yes                                 +---------+---------------+---------+-----------+----------+--------------+  PTV      Full                                                        +---------+---------------+---------+-----------+----------+--------------+ PERO                                                  Not visualized +---------+---------------+---------+-----------+----------+--------------+     Summary: Left: There is no evidence of deep vein thrombosis in the lower extremity. However, portions of this examination were limited- see technologist comments above. No cystic structure found in the popliteal fossa.  *See table(s) above for measurements and observations. Electronically signed by Curt Jews MD on 06/01/2018 at 5:17:18 PM.    Final    [place] Ekg Site Rite  Result Date: 06/01/2018 If Site Rite image not attached, placement could not be confirmed due to current cardiac rhythm.  [place] Ekg Site Rite  Result Date: 05/31/2018 If [facility] image not attached, placement could not be confirmed due to current cardiac rhythm.   Patient benefited maximally from their hospital stay and there were no complications.     Disposition: Discharge disposition: 01-Home or Self Care      Discharge Instructions    Call MD / Call 911   Complete by:  As directed    If you experience chest pain or shortness of breath, CALL 911 and be  transported to the hospital emergency room.  If you develope a fever above 101 F, pus (white drainage) or increased drainage or redness at the wound, or calf pain, call your surgeon's office.   Call MD / Call 911   Complete by:  As directed    If you experience chest pain or shortness of breath, CALL 911 and be transported to the hospital emergency room.  If you develope a fever above 101 F, pus (white drainage) or increased drainage or redness at the wound, or calf pain, call your surgeon's office.   Constipation Prevention   Complete by:  As directed    Drink plenty of fluids.  Prune juice may be helpful.  You may use a stool softener, such as Colace (over the counter) 100 mg twice a day.  Use MiraLax (over the counter) for constipation as needed.   Constipation Prevention   Complete by:  As directed    Drink plenty of fluids.  Prune juice may be helpful.  You may use a stool softener, such as Colace (over the counter) 100 mg twice a day.  Use MiraLax (over the counter) for constipation as needed.   Diet - low sodium heart healthy   Complete by:  As directed    Diet - low sodium heart healthy   Complete by:  As directed    Increase activity slowly as tolerated   Complete by:  As directed    Increase activity slowly as tolerated   Complete by:  As directed    Negative Pressure Wound Therapy - Incisional   Complete by:  As directed    Attach to portable Pravena pump, show patient how to plug it in     Follow-up Information    [name], MD. Schedule an appointment  as soon as possible for a visit on 06/07/2018.   Specialty:  Orthopedic Surgery Contact information: [place] [address] [phone]            Signed: Erlinda Hong, Hershal Coria 06/03/2018, 11:30 AM  [employer] [phone]

## 2018-06-03 NOTE — Progress Notes (Signed)
Patient discharged to home with instructions and prescriptions, connected to Prevena wound vac.

## 2018-06-03 NOTE — Progress Notes (Signed)
PHARMACY CONSULT NOTE FOR:  OUTPATIENT  PARENTERAL ANTIBIOTIC THERAPY (OPAT)  Indication: MRSA Wound infection Regimen: Vancomycin 1500mg  q24hr End date: 5/23  IV antibiotic discharge orders are pended. To discharging provider:  please sign these orders via discharge navigator,  Select New Orders & click on the button choice - Manage This Unsigned Work.     Thank you for allowing pharmacy to be a part of this patient's care.  April [name], [name] 06/03/2018, 10:06 AM

## 2018-06-03 NOTE — Discharge Instructions (Signed)
Keep Prevena VAC plugged into wall outlet as much as possible to keep charged.   Follow up in the office next Tuesday.

## 2018-06-03 NOTE — TOC Transition Note (Addendum)
Transition of Care [facility]) - CM/SW Discharge Note   Patient Details  Name: April [name] MRN: [id number] Date of Birth: [birth year]  Transition of [facility]) CM/SW Contact:  Marilu Favre, RN Phone Number: 06/03/2018, 11:47 AM   Clinical Narrative:     Spoke to patient at bedside. Offered PTAR transportation home at discharge, however, patient stated her husband will drive her home. Placed PTAR paperwork in shadow chart just in case needed. Pam with [facility] will see patient today before discharge. Cassie with [facility] aware discharge is planned for today.   Final next level of care: [place] Barriers to Discharge: No Barriers Identified   Patient Goals and CMS Choice Patient states their goals for this hospitalization and ongoing recovery are:: to go home  CMS Medicare.gov Compare Post Acute Care list provided to:: Patient Choice offered to / list presented to : Patient  Discharge Placement                       Discharge Plan and Services   Discharge Planning Services: CM Consult Post Acute Care Choice: Home Health          DME Arranged: N/A DME Agency: NA       HH Arranged: RN, PT, Disease Management, OT, Nurse's Aide [place] Agency: [facility] Date HH Agency Contacted: 06/03/18 Time Clarissa: 81 Representative spoke with at [place] Interventions     Readmission Risk Interventions No flowsheet data found.

## 2018-06-06 ENCOUNTER — Telehealth: Payer: Self-pay | Admitting: Orthopedic Surgery

## 2018-06-06 ENCOUNTER — Ambulatory Visit: Payer: Medicare Other | Admitting: Orthopedic Surgery

## 2018-06-06 NOTE — Telephone Encounter (Signed)
Pt called in said she had surgery with Dr.Duda about 2 weeks ago and was given a wound vac which happens to be getting full and beeping constantly and is wondering if it will be okay till she come in to her appt on 06/07/2018. Also she has some concerns about her medication that shed like to discuss with autumn

## 2018-06-06 NOTE — Telephone Encounter (Signed)
appt today at 2:30 for eval

## 2018-06-07 ENCOUNTER — Other Ambulatory Visit: Payer: Self-pay

## 2018-06-07 ENCOUNTER — Telehealth: Payer: Self-pay

## 2018-06-07 ENCOUNTER — Ambulatory Visit (INDEPENDENT_AMBULATORY_CARE_PROVIDER_SITE_OTHER): Payer: Medicare Other | Admitting: Physician Assistant

## 2018-06-07 ENCOUNTER — Encounter: Payer: Self-pay | Admitting: Orthopedic Surgery

## 2018-06-07 VITALS — Ht 64.0 in | Wt 360.0 lb

## 2018-06-07 DIAGNOSIS — N183 Chronic kidney disease, stage 3 unspecified: Secondary | ICD-10-CM

## 2018-06-07 DIAGNOSIS — I87331 Chronic venous hypertension (idiopathic) with ulcer and inflammation of right lower extremity: Secondary | ICD-10-CM | POA: Diagnosis not present

## 2018-06-07 DIAGNOSIS — R600 Localized edema: Secondary | ICD-10-CM | POA: Diagnosis not present

## 2018-06-07 DIAGNOSIS — I5032 Chronic diastolic (congestive) heart failure: Secondary | ICD-10-CM

## 2018-06-07 DIAGNOSIS — Z945 Skin transplant status: Secondary | ICD-10-CM | POA: Diagnosis not present

## 2018-06-07 DIAGNOSIS — L97919 Non-pressure chronic ulcer of unspecified part of right lower leg with unspecified severity: Secondary | ICD-10-CM

## 2018-06-07 LAB — CULTURE, BLOOD (ROUTINE X 2)
Culture: NO GROWTH
Culture: NO GROWTH
Special Requests: ADEQUATE
Special Requests: ADEQUATE

## 2018-06-07 NOTE — Telephone Encounter (Signed)
Order for [facility] to apply silvadene (small amount) to gauze and then apply to RLE wound and then apply bilateral dynaflex dressing changes to legs three times a week. This was sent to tiffany with [facility]. Advised to notify me if there are any questions or concerns or if documentation from the visit today is needed.

## 2018-06-07 NOTE — Progress Notes (Signed)
Office Visit Note   Patient: April [name]           Date of Birth: Jan 09, 1951           MRN: [id number] Visit Date: 06/07/2018              Requested by: [name], MD [place], [place] [address] PCP: [name], MD  Chief Complaint  Patient presents with  . Right Leg - Routine Post Op    05/27/2018 right leg STSG      HPI: The patient is a 68 yo woman who is seen for post operative follow up following repeat debridement and placement of fetal bovine dermal graft 8 x 10 cm to her residual right lower leg wound 05/27/2018. She has had a cleanse choice VAC in place for the past ~ 12 days. She has 50 cc in the canister currently, but reports the canister had to be changed out yesterday as it was full. She has bilateral leg edema and will need to continue compression wraps bilaterally. Operative cultures grew MRSA and she continues on IV vancomycin per PICC line. She reports diarrhea when she takes the zithromax and we discussed she can stop the zithromax as she completed a 5 day course as it was started while hospitalized .   Assessment & Plan: Visit Diagnoses:  1. S/P split thickness skin graft   2. Idiopathic chronic venous hypertension of right lower extremity with ulcer and inflammation (HCC)   3. Chronic diastolic heart failure ([place])   4. CKD (chronic kidney disease) stage 3, GFR 30-59 ml/min (HCC)     Plan: Silvadene to right lower leg wound on 4 x 4 gauze, then Dynaflex multi layer compression wraps to bilateral lower legs 3 times weekly per home health. Updated orders sent to [facility].   Stop zithromax as completed 5 day course for bronchitis.  Continues IV Vancomycin per PICC line for MRSA from operative cultures. Follow up in 2 weeks.   Follow-Up Instructions: Return in about 2 weeks (around 06/21/2018).   Ortho Exam  Patient is alert, oriented, no adenopathy, well-dressed, normal affect, normal respiratory effort. The VAC dressing was removed and the wound  bed has good pink granulation present and the fetal bovine dermal graft has incorporated well. There is no sign of cellulitis of the leg. There is bilateral lower leg edema, but much improved from previous.   Imaging: No results found.   Labs: Lab Results  Component Value Date   HGBA1C 6.1 (H) 05/27/2018   HGBA1C 6.5 12/03/2017   HGBA1C 6.2 06/29/2017   ESRSEDRATE 56 (H) 03/09/2018   ESRSEDRATE 13 06/02/2016   ESRSEDRATE 24 (H) 01/23/2011   CRP 6.3 (H) 03/09/2018   CRP 2.2 06/02/2016   REPTSTATUS 06/07/2018 FINAL 06/02/2018   REPTSTATUS 06/07/2018 FINAL 06/02/2018   GRAMSTAIN  05/27/2018    FEW WBC PRESENT, PREDOMINANTLY PMN MODERATE GRAM POSITIVE RODS RARE GRAM POSITIVE COCCI IN PAIRS    CULT  06/02/2018    NO GROWTH 5 DAYS Performed at [facility] [place] [address] [place], [place] [address]    CULT  06/02/2018    NO GROWTH 5 DAYS Performed at [facility] [address], [place],  [address]    LABORGA METHICILLIN RESISTANT STAPHYLOCOCCUS AUREUS 05/27/2018     Lab Results  Component Value Date   ALBUMIN 2.2 (L) 03/08/2018   ALBUMIN 4.0 12/14/2017   ALBUMIN 3.8 12/03/2017  Body mass index is 61.8 kg/m.  Orders:  No orders of the defined types were placed in this encounter.  No orders of the defined types were placed in this encounter.    Procedures: No procedures performed  Clinical Data: No additional findings.  ROS:  All other systems negative, except as noted in the HPI. Review of Systems  Objective: Vital Signs: Ht 5\' 4"  (1.626 m)   Wt (!) 360 lb 0.2 oz (163.3 kg)   BMI 61.80 kg/m   Specialty Comments:  No specialty comments available.  PMFS History: Patient Active Problem List   Diagnosis Date Noted  . Onychomycosis 05/19/2018  . Idiopathic chronic venous hypertension of right lower extremity with ulcer and inflammation ([place]) 05/19/2018  . Traumatic wound dehiscence   . Leg wound, right, sequela 02/18/2018  .  Blister 02/18/2018  . Cellulitis of right leg 01/14/2018  . Skin lumps 01/14/2018  . Bilateral leg cramps 12/14/2017  . Dry skin dermatitis 06/29/2017  . Left shoulder pain 08/14/2016  . Meralgia paresthetica 07/16/2016  . Chronic left-sided low back pain with left-sided sciatica 06/02/2016  . Venous stasis ulcer ([place]) 05/13/2016  . Abscess of right lower leg 03/28/2016  . Whole body pain 03/20/2016  . Diabetic peripheral neuropathy ([place]) 03/20/2016  . Osteopenia 01/11/2016  . CKD (chronic kidney disease) stage 3, GFR 30-59 ml/min (HCC) 01/02/2016  . Hand paresthesia 07/18/2015  . Carpal tunnel syndrome 06/07/2015  . Family history of colon cancer   . Diabetes mellitus with neurological manifestations ([place]) 04/18/2015  . Bilateral leg edema 04/11/2015  . Cellulitis of leg, right 04/11/2015  . Abnormality of gait 01/03/2015  . Hereditary and idiopathic peripheral neuropathy 01/03/2015  . GERD (gastroesophageal reflux disease) 06/17/2014  . Morbid obesity with BMI of 50.0-59.9, adult ([place]) 09/05/2013  . Hx of colonic polyps 12/15/2012  . Vitamin B12 deficiency 11/03/2012  . Intrinsic asthma 03/23/2012  . Chronic diastolic heart failure ([place]) 02/20/2011  . OSA (obstructive sleep apnea) 09/17/2010  . URINARY URGENCY 01/08/2010  . CAD, NATIVE VESSEL 11/20/2008  . OSTEOARTHRITIS, KNEES, BILATERAL, SEVERE 06/14/2008  . Hyperlipidemia 05/10/2007  . Essential hypertension 01/18/2007  . HYPOKALEMIA 04/30/2006  . Body mass index 60.0-69.9, adult ([place]) 04/30/2006  . HX, PERSONAL, PEPTIC ULCER DISEASE 04/30/2006   Past Medical History:  Diagnosis Date  . Anemia   . Asthma   . CAD (coronary artery disease)   . Carpal tunnel syndrome   . Cellulitis of both lower extremities 04/11/2015  . CHF (congestive heart failure) ([place])   . Chronic kidney disease    [facility]- followed by [facility]  . Colon polyp, hyperplastic 2007 & 2012  . Complication of anesthesia 1999   svt with renal  calculi surgery, no problems since  . CTS (carpal tunnel syndrome)    bilateral  . Diabetes mellitus   . Eczema   . GERD (gastroesophageal reflux disease)   . History of kidney stones 1999  . Hyperlipidemia   . Hypertension   . Leg ulcer ([place]) 04/24/2015   Right lateral leg No evidence of an infection Monitor closely Keep edema controlled   . Leg ulcer ([place])    right lower  . Meralgia paresthetica    Dr. [name]  . Morbid obesity ([place])   . Morbid obesity ([place])   . Neuropathy    toes and legs  . Osteoarthrosis, unspecified whether generalized or localized, lower leg    knee  . PUD (peptic ulcer disease)   . Shortness of  breath dyspnea    with exertion  . Sleep apnea    per progress note 02/25/2018  . Type II or unspecified type diabetes mellitus without mention of complication, not stated as uncontrolled   . Unspecified hereditary and idiopathic peripheral neuropathy   . Vitamin B12 deficiency   . Wears glasses   . Wound cellulitis    right upper leg, healing well    Family History  Problem Relation Age of Onset  . Colon cancer Mother   . Prostate cancer Father   . Colon cancer Father   . Diabetes Maternal Aunt   . Breast cancer Maternal Aunt   . Diabetes Maternal Uncle   . Diabetes Paternal Aunt   . Stroke Paternal Aunt        > 65  . Heart disease Paternal Aunt   . Diabetes Paternal Uncle   . Breast cancer Maternal Aunt         X 2  . Breast cancer Cousin     Past Surgical History:  Procedure Laterality Date  . ABDOMINAL HYSTERECTOMY    . CARDIAC CATHETERIZATION  2002   non obstructive disease  . colonoscopy with polypectomy  2007 & 2012    hyperplastic ;Dr Watt Climes  . COLONOSCOPY WITH PROPOFOL N/A 06/04/2015   Procedure: COLONOSCOPY WITH PROPOFOL;  Surgeon: Jerene Bears, MD;  Location: [facility];  Service: Gastroenterology;  Laterality: N/A;  . DEBRIDEMENT LEG Right 03/02/2018   WOUND VAC APPLIED  . DEBRIDEMENT LEG Right 05/27/2018   RIGHT LOWER LEG  DEBRIDEMENT,  SKIN GRAFT, VAC PLACEMENT   . DILATION AND CURETTAGE OF UTERUS     multiple  . HEMORRHOID SURGERY    . I&D EXTREMITY Right 03/02/2018   Procedure: RIGHT LEG DEBRIDEMENT AND PLACE VAC;  Surgeon: [name], MD;  Location: [facility];  Service: Orthopedics;  Laterality: Right;  . I&D EXTREMITY Right 03/04/2018   Procedure: REPEAT IRRIGATION AND DEBRIDEMENT RIGHT LEG, PLACE WOUND VAC;  Surgeon: [name], MD;  Location: [facility];  Service: Orthopedics;  Laterality: Right;  . I&D EXTREMITY Right 03/30/2018   Procedure: IRRIGATION AND DEBRIDEMENT RIGHT LEG, APPLY WOUND VAC;  Surgeon: [name], MD;  Location: [facility];  Service: Orthopedics;  Laterality: Right;  . I&D EXTREMITY Right 05/27/2018   Procedure: RIGHT LOWER LEG DEBRIDEMENT,  SKIN GRAFT, VAC PLACEMENT;  Surgeon: [name], MD;  Location: [facility];  Service: Orthopedics;  Laterality: Right;  RIGHT LOWER LEG DEBRIDEMENT,  SKIN GRAFT, VAC PLACEMENT  . renal calculi  12/1997   SVT with induction of anesthesia  . right knee arthroscopy    . SKIN SPLIT GRAFT Right 03/04/2018   Procedure: POSSIBLE SPLIT THICKNESS SKIN GRAFT;  Surgeon: [name], MD;  Location: [facility];  Service: Orthopedics;  Laterality: Right;  . SKIN SPLIT GRAFT Right 04/01/2018   Procedure: REPEAT IRRIGATION AND DEBRIDEMENT RIGHT LEG, APPLY SPLIT THICKNESS SKIN GRAFT;  Surgeon: [name], MD;  Location: [facility];  Service: Orthopedics;  Laterality: Right;  . TONSILLECTOMY AND ADENOIDECTOMY     Social History   Occupational History  . Occupation: Disabled    Employer: RETIRED  Tobacco Use  . Smoking status: Never Smoker  . Smokeless tobacco: Never Used  Substance and Sexual Activity  . Alcohol use: No    Alcohol/week: 0.0 standard drinks  . Drug use: No  . Sexual activity: Not on file

## 2018-06-08 ENCOUNTER — Telehealth: Payer: Self-pay

## 2018-06-08 ENCOUNTER — Telehealth: Payer: Self-pay | Admitting: Orthopedic Surgery

## 2018-06-08 ENCOUNTER — Other Ambulatory Visit: Payer: Self-pay

## 2018-06-08 MED ORDER — SILVER SULFADIAZINE 1 % EX CREA: 1.0000 "application " | TOPICAL_CREAM | Freq: Every day | CUTANEOUS | 0 refills | Status: DC

## 2018-06-08 NOTE — Telephone Encounter (Signed)
Nurse from [facility] called about a RX silvadene cream. They need it faxed to the pharmacy for wound care.  [facility] on [address]

## 2018-06-08 NOTE — Telephone Encounter (Signed)
Pt called and states that she did not receive the rx for silvadene cream that was supposed to be ordered Monday to use for her HHN dressing changes. I advised that I would fax this to pharm. To call with any questions.

## 2018-06-08 NOTE — Telephone Encounter (Signed)
Olivia Mackie, [facility] with [facility] would like an order for a Bath Aid for patient to assist with bathing.  Cb# is [phone].  Please advise.  Thank you.

## 2018-06-08 NOTE — Telephone Encounter (Signed)
Called and lm on vm to advise verbal ok for bath aid. To call with any questions.

## 2018-06-08 NOTE — Progress Notes (Signed)
silva

## 2018-06-10 ENCOUNTER — Other Ambulatory Visit: Payer: Self-pay

## 2018-06-10 ENCOUNTER — Telehealth: Payer: Self-pay | Admitting: Emergency Medicine

## 2018-06-10 ENCOUNTER — Ambulatory Visit: Payer: Medicare Other | Admitting: Orthotics

## 2018-06-10 ENCOUNTER — Ambulatory Visit: Payer: Medicare Other | Admitting: Podiatry

## 2018-06-10 VITALS — Temp 97.3°F

## 2018-06-10 DIAGNOSIS — B351 Tinea unguium: Secondary | ICD-10-CM | POA: Diagnosis not present

## 2018-06-10 DIAGNOSIS — M2141 Flat foot [pes planus] (acquired), right foot: Secondary | ICD-10-CM

## 2018-06-10 DIAGNOSIS — Q828 Other specified congenital malformations of skin: Secondary | ICD-10-CM

## 2018-06-10 DIAGNOSIS — L84 Corns and callosities: Secondary | ICD-10-CM

## 2018-06-10 DIAGNOSIS — E1142 Type 2 diabetes mellitus with diabetic polyneuropathy: Secondary | ICD-10-CM

## 2018-06-10 DIAGNOSIS — M2142 Flat foot [pes planus] (acquired), left foot: Secondary | ICD-10-CM

## 2018-06-10 NOTE — Progress Notes (Signed)

## 2018-06-10 NOTE — Telephone Encounter (Signed)
Copied from [place] [id number]. Topic: Quick Communication - Home Health Verbal Orders >> Jun 09, 2018  4:57 PM Percell Belt A wrote: Caller/Agency: [facility] Number: [phone] Requesting OT/PT/Skilled Nursing/Social Work/Speech Therapy: Requesting verbals for OT  Frequency: 2 week 1 / 1 week 2

## 2018-06-10 NOTE — Telephone Encounter (Signed)
Gave ok for orders.  

## 2018-06-13 ENCOUNTER — Telehealth: Payer: Self-pay | Admitting: Internal Medicine

## 2018-06-13 NOTE — Telephone Encounter (Signed)
Gave ok for orders.  

## 2018-06-13 NOTE — Telephone Encounter (Signed)
Copied from [place] [id number]. Topic: Quick Communication - Home Health Verbal Orders >> Jun 13, 2018 10:55 AM Gustavus Messing wrote: Caller/Agency: [facility] Number: [place] Requesting OT/PT/Skilled Nursing/Social Work/Speech Therapy: Skilled Nursing  Frequency: B 12 shots monthly Lpn wants to verify if Dr. Leonette Monarch and Dr. [name] are getting the weekly lab results?

## 2018-06-14 ENCOUNTER — Ambulatory Visit: Payer: Medicare Other | Admitting: Internal Medicine

## 2018-06-15 ENCOUNTER — Telehealth: Payer: Self-pay | Admitting: Podiatry

## 2018-06-15 NOTE — Telephone Encounter (Signed)
Received email from [place] and last visit with pcp Dr Quay Burow for her diabetic check up was 5.2019. Pt is scheduled to see her on 6.12.2020. I explained that we have to wait for the appt on 6.12.2020 to resubmit the shoe paperwork. I will fax over on 6.12.2020. Pt understood

## 2018-06-16 ENCOUNTER — Encounter: Payer: Self-pay | Admitting: Podiatry

## 2018-06-16 NOTE — Progress Notes (Addendum)
Subjective: April [name] presents with h/o diabetes, diabetic neuropathy and cc of painful, discolored, thick toenails. Patient also has callus lateral aspect left foot which requires periodic debridement to avoid ulceration formation.  Patient states she had debridement of her right leg wound and it is very tender.   She voices no new pedal concerns on today's visit.  [name], MD is her PCP and last visit was 01/14/2018.   Current Outpatient Medications:  .  albuterol (PROVENTIL HFA) 108 (90 Base) MCG/ACT inhaler, Inhale 2 puffs into the lungs every 4 (four) hours as needed for wheezing or shortness of breath., Disp: 1 Inhaler, Rfl: 4 .  albuterol (PROVENTIL) (2.5 MG/3ML) 0.083% nebulizer solution, Take 3 mLs (2.5 mg total) by nebulization every 6 (six) hours as needed for wheezing or shortness of breath., Disp: 75 mL, Rfl: 12 .  Amino Acids-Protein Hydrolys (FEEDING SUPPLEMENT, PRO-STAT SUGAR FREE 64,) LIQD, Take 30 mLs by mouth 3 (three) times daily., Disp: , Rfl:  .  aspirin (ECOTRIN LOW STRENGTH) 81 MG EC tablet, Take 81 mg by mouth at bedtime. , Disp: , Rfl:  .  betamethasone dipropionate (DIPROLENE) 0.05 % cream, Apply 1 application topically 2 (two) times daily as needed for rash., Disp: , Rfl:  .  budesonide-formoterol (SYMBICORT) 160-4.5 MCG/ACT inhaler, one - two inhalations every 12 hours; gargle and spit after use (Patient taking differently: Inhale 2 puffs into the lungs 2 (two) times daily as needed (shortness of breath). gargle and spit after use), Disp: 1 Inhaler, Rfl: 4 .  calcium carbonate (TUMS - DOSED IN MG ELEMENTAL CALCIUM) 500 MG chewable tablet, Chew 2 tablets by mouth daily as needed for indigestion or heartburn., Disp: , Rfl:  .  carvedilol (COREG) 25 MG tablet, TAKE 1 TABLET BY MOUTH TWICE DAILY WITH A MEAL (Patient taking differently: Take 25 mg by mouth 2 (two) times daily with a meal. ), Disp: 60 tablet, Rfl: 5 .  COD LIVER OIL PO, Take 1 capsule by mouth  daily., Disp: , Rfl:  .  cyanocobalamin (,VITAMIN B-12,) 1000 MCG/ML injection, INJECT 1ML INTO MUSCLE EVERY 30 DAYS (Patient taking differently: Inject 1,000 mcg into the muscle every 30 (thirty) days. ), Disp: 10 mL, Rfl: 0 .  diclofenac sodium (VOLTAREN) 1 % GEL, Apply 2 g topically 4 (four) times daily. (Patient taking differently: Apply 2 g topically 4 (four) times daily as needed (pain). ), Disp: 100 g, Rfl: 11 .  diphenhydrAMINE (BENADRYL) 12.5 MG/5ML liquid, Take 25 mg by mouth daily as needed for itching., Disp: , Rfl:  .  docusate sodium (COLACE) 100 MG capsule, Take 1 capsule (100 mg total) by mouth 2 (two) times daily. (Patient taking differently: Take 100 mg by mouth 2 (two) times daily as needed for mild constipation. ), Disp: 10 capsule, Rfl: 0 .  famotidine (PEPCID) 20 MG tablet, Take 1 tablet (20 mg total) by mouth 2 (two) times daily., Disp: 180 tablet, Rfl: 3 .  Flaxseed, Linseed, (FLAX SEEDS PO), Take 1 capsule by mouth 2 (two) times a day., Disp: , Rfl:  .  folic acid (FOLVITE) 604 MCG tablet, Take 400 mcg by mouth at bedtime. , Disp: , Rfl:  .  furosemide (LASIX) 40 MG tablet, Take 3 tablets (120 mg total) by mouth 2 (two) times daily., Disp: 360 tablet, Rfl: 5 .  hydrOXYzine (ATARAX/VISTARIL) 25 MG tablet, Take 25 mg by mouth every 6 (six) hours as needed for itching., Disp: , Rfl:  .  Lancets (  ONETOUCH ULTRASOFT) lancets, Use to help check blood sugars twice a day Dx E11.9, Disp: 100 each, Rfl: 3 .  Multiple Vitamin (MULTIVITAMIN WITH MINERALS) TABS tablet, Take 1 tablet by mouth daily., Disp: , Rfl:  .  NEEDLE, DISP, 23 G (EASY TOUCH FLIPLOCK NEEDLES) 23G X 1-1/2" MISC, Use to inject B12 once monthly., Disp: 5 each, Rfl: 0 .  nystatin cream (MYCOSTATIN), Apply 1 application topically 2 (two) times daily. (Patient taking differently: Apply 1 application topically 2 (two) times daily as needed (yeast). ), Disp: 30 g, Rfl: 2 .  Omega-3 Fatty Acids (FISH OIL) 1000 MG CAPS, Take  1,000 mg by mouth daily. , Disp: , Rfl:  .  OXcarbazepine (TRILEPTAL) 150 MG tablet, Take 1 tablet (150 mg total) by mouth 2 (two) times daily., Disp: 60 tablet, Rfl: 11 .  oxyCODONE-acetaminophen (PERCOCET/ROXICET) 5-325 MG tablet, Take 1 tablet by mouth every 4 (four) hours as needed., Disp: 30 tablet, Rfl: 0 .  polyethylene glycol (MIRALAX / GLYCOLAX) packet, Take 17 g by mouth daily as needed for mild constipation., Disp: 14 each, Rfl: 0 .  potassium chloride SA (K-DUR,KLOR-CON) 20 MEQ tablet, TAKE 3 TABLETS BY MOUTH DAILY. (Patient taking differently: Take 60 mEq by mouth daily. ), Disp: 270 tablet, Rfl: 1 .  silver sulfADIAZINE (SILVADENE) 1 % cream, Apply 1 application topically daily., Disp: 400 g, Rfl: 0 .  simvastatin (ZOCOR) 40 MG tablet, Take 1 tablet (40 mg total) by mouth at bedtime., Disp: 90 tablet, Rfl: 1 .  spironolactone (ALDACTONE) 25 MG tablet, TAKE 1 TABLET BY MOUTH ONCE DAILY (Patient taking differently: Take 25 mg by mouth daily. ), Disp: 90 tablet, Rfl: 1 .  SYRINGE-NEEDLE, DISP, 3 ML 23G X 1" 3 ML MISC, Use with B12 to inject IM every 30 days, Disp: 50 each, Rfl: 0 .  vancomycin (VANCOCIN) 10 G SOLR injection, , Disp: , Rfl:  .  vancomycin 1,500 mg in sodium chloride 0.9 % 500 mL, Inject 1,500 mg into the vein daily for 22 days., Disp: 22 Doses/Fill, Rfl: 0  Allergies  Allergen Reactions  . Penicillins Rash and Other (See Comments)    She was told not to take it anymore. Has patient had a PCN reaction causing immediate rash, facial/tongue/throat swelling, SOB or lightheadedness with hypotension: Yes Has patient had a PCN reaction causing severe rash involving mucus membranes or skin necrosis: No Has patient had a PCN reaction that required hospitalization: No Has patient had a PCN reaction occurring within the last 10 years: No If all of the above answers are "NO", then may proceed with Cephalosporin use.'  . Shellfish Allergy Anaphylaxis  . Sulfonamide Derivatives  Anaphylaxis and Other (See Comments)    REACTION: internal "burning"  . Hydrochlorothiazide W-Triamterene Other (See Comments)    Hypokalemia  . Lotensin [Benazepril Hcl] Hives  . Dipyridamole Other (See Comments)    Unknown reaction  . Estrogens Other (See Comments)    Unknown reaction  . Hydrochlorothiazide Other (See Comments)    Unknown reaction  . Metronidazole Other (See Comments)    Unknown reaction  . Spironolactone Other (See Comments)    UNSPECIFIED > "kidney problems"  . Sulfa Antibiotics Other (See Comments)    Unknown reaction  . Torsemide Other (See Comments)    Unknown reaction  . Valsartan Other (See Comments)    Unknown reaction  . Madelaine Bhat Isothiocyanate] Itching   Objective: Vitals:   06/10/18 0924  Temp: (!) 97.3 F (36.3 C)  Vascular Examination: Capillary refill time <5 seconds x 10 digits.  Dorsalis pedis pulses nonpalpable b/l.  Posterior tibial pulses nonpalpable b/l.  Digital hair absent x 10 digits.  Skin temperature gradient WNL b/l.  Lymphedema BLE.  Dermatological Examination: Skin with normal turgor, texture and tone b/l.  Toenails 1-5 b/l discolored, thick, dystrophic with subungual debris and pain with palpation to nailbeds due to thickness of nails.  Bilateral leg wraps with coban outer dressing and both are clean, dry and intact.  Musculoskeletal: Muscle strength 4/5 to all LE muscle groups.  Right 5th digit hammertoe.  Pes planus foot deformity b/l.   Pt is in wheelchair on today's visit.  Neurological: Sensation diminished b/l with 10 gram monofilament.  Vibratory sensation diminished b/l.  No tenderness with calf compression b/l.  Assessment: 1. Painful onychomycosis toenails 1-5 b/l 2. NIDDM with diabetic neuropathy 3. Pes planus foot deformity; hammertoe right 5th digit 4. Lymphedema BLE  Plan: 1. Continue diabetic foot care principles. Literature dispensed on today. 2. Toenails 1-5 b/l were  debrided in length and girth without iatrogenic bleeding. 3. Patient to continue soft, supportive shoe gear daily. 4. Patient to report any pedal injuries to medical professional  5. Follow up 3 months.  6. Patient/POA to call should there be a concern in the interim.

## 2018-06-20 ENCOUNTER — Encounter: Payer: Self-pay | Admitting: Internal Medicine

## 2018-06-21 ENCOUNTER — Ambulatory Visit (INDEPENDENT_AMBULATORY_CARE_PROVIDER_SITE_OTHER): Payer: Medicare Other | Admitting: Orthopedic Surgery

## 2018-06-21 ENCOUNTER — Other Ambulatory Visit: Payer: Self-pay

## 2018-06-21 ENCOUNTER — Encounter: Payer: Self-pay | Admitting: Orthopedic Surgery

## 2018-06-21 ENCOUNTER — Ambulatory Visit: Payer: Medicare Other | Admitting: Orthopedic Surgery

## 2018-06-21 VITALS — Ht 64.0 in | Wt 360.0 lb

## 2018-06-21 DIAGNOSIS — I87331 Chronic venous hypertension (idiopathic) with ulcer and inflammation of right lower extremity: Secondary | ICD-10-CM

## 2018-06-21 DIAGNOSIS — L97919 Non-pressure chronic ulcer of unspecified part of right lower leg with unspecified severity: Secondary | ICD-10-CM

## 2018-06-21 MED ORDER — OXYCODONE-ACETAMINOPHEN 5-325 MG PO TABS: 1.0000 | ORAL_TABLET | Freq: Four times a day (QID) | ORAL | 0 refills | Status: DC | PRN

## 2018-06-21 NOTE — Progress Notes (Signed)
Office Visit Note   Patient: April [name]           Date of Birth: 1950-08-02           MRN: [id number] Visit Date: 06/21/2018              Requested by: [name], MD [place], [place] [address] PCP: [name], MD  Chief Complaint  Patient presents with  . Right Leg - Routine Post Op    05/27/18 RLE [facility]      HPI: The patient is a 68 yo woman who is seen for post operative follow up following serial debridements and fetal bovine dermal graft to the right lower leg ulcer on 05/27/2018. Operative cultures grew MRSA and she continues on IV Vancomycin per PICC line.  She reports pain with dressing changes. Home health is seeing her 3 times weekly for silvadene dressing changes to the right leg ulcer with compression wraps bilaterally for edema control.   Assessment & Plan: Visit Diagnoses:  1. Idiopathic chronic venous hypertension of right lower extremity with ulcer and inflammation (HCC)     Plan: Rest of staples removed today.  Continue HHC nursing for dressing changes 3 times weekly with silvadene to the wound bed right leg and then Dynaflex compression wraps bilaterally.  Continues IV Vancomycin per PICC line.  Follow up in 2 weeks.   Follow-Up Instructions: Return in about 2 weeks (around 07/05/2018).   Ortho Exam  Patient is alert, oriented, no adenopathy, well-dressed, normal affect, normal respiratory effort. Right lower leg wound- graft is well incorporated. No signs of infection or cellulitis. Edema controlled over the area where applied.  She has palpable pedal pulses.  No peri wound irritation or breakdown.  Imaging: No results found.   Labs: Lab Results  Component Value Date   HGBA1C 6.1 (H) 05/27/2018   HGBA1C 6.5 12/03/2017   HGBA1C 6.2 06/29/2017   ESRSEDRATE 56 (H) 03/09/2018   ESRSEDRATE 13 06/02/2016   ESRSEDRATE 24 (H) 01/23/2011   CRP 6.3 (H) 03/09/2018   CRP 2.2 06/02/2016   REPTSTATUS 06/07/2018 FINAL 06/02/2018   REPTSTATUS 06/07/2018 FINAL 06/02/2018   GRAMSTAIN  05/27/2018    FEW WBC PRESENT, PREDOMINANTLY PMN MODERATE GRAM POSITIVE RODS RARE GRAM POSITIVE COCCI IN PAIRS    CULT  06/02/2018    NO GROWTH 5 DAYS Performed at [facility], [place] [address], [place], [place] [address]    CULT  06/02/2018    NO GROWTH 5 DAYS Performed at [facility] [address] [place], [place] [address]    LABORGA METHICILLIN RESISTANT STAPHYLOCOCCUS AUREUS 05/27/2018     Lab Results  Component Value Date   ALBUMIN 2.2 (L) 03/08/2018   ALBUMIN 4.0 12/14/2017   ALBUMIN 3.8 12/03/2017    Body mass index is 61.79 kg/m.  Orders:  No orders of the defined types were placed in this encounter.  Meds ordered this encounter  Medications  . oxyCODONE-acetaminophen (PERCOCET/ROXICET) 5-325 MG tablet    Sig: Take 1 tablet by mouth every 6 (six) hours as needed for severe pain.    Dispense:  28 tablet    Refill:  0     Procedures: No procedures performed  Clinical Data: No additional findings.  ROS:  All other systems negative, except as noted in the HPI. Review of Systems  Objective: Vital Signs: Ht 5\' 4"  (1.626 m)   Wt (!) 360 lb (163.3 kg)  BMI 61.79 kg/m   Specialty Comments:  No specialty comments available.  PMFS History: Patient Active Problem List   Diagnosis Date Noted  . Onychomycosis 05/19/2018  . Idiopathic chronic venous hypertension of right lower extremity with ulcer and inflammation ([place]) 05/19/2018  . Traumatic wound dehiscence   . Leg wound, right, sequela 02/18/2018  . Blister 02/18/2018  . Cellulitis of right leg 01/14/2018  . Skin lumps 01/14/2018  . Bilateral leg cramps 12/14/2017  . Dry skin dermatitis 06/29/2017  . Left shoulder pain 08/14/2016  . Meralgia paresthetica 07/16/2016  . Chronic left-sided low back pain with left-sided sciatica 06/02/2016  . Venous stasis ulcer ([place]) 05/13/2016  . Abscess of right lower leg 03/28/2016  . Whole  body pain 03/20/2016  . Diabetic peripheral neuropathy ([place]) 03/20/2016  . Osteopenia 01/11/2016  . CKD (chronic kidney disease) stage 3, GFR 30-59 ml/min (HCC) 01/02/2016  . Hand paresthesia 07/18/2015  . Carpal tunnel syndrome 06/07/2015  . Family history of colon cancer   . Diabetes mellitus with neurological manifestations ([place]) 04/18/2015  . Bilateral leg edema 04/11/2015  . Cellulitis of leg, right 04/11/2015  . Abnormality of gait 01/03/2015  . Hereditary and idiopathic peripheral neuropathy 01/03/2015  . GERD (gastroesophageal reflux disease) 06/17/2014  . Morbid obesity with BMI of 50.0-59.9, adult ([place]) 09/05/2013  . Hx of colonic polyps 12/15/2012  . Vitamin B12 deficiency 11/03/2012  . Intrinsic asthma 03/23/2012  . Chronic diastolic heart failure ([place]) 02/20/2011  . OSA (obstructive sleep apnea) 09/17/2010  . URINARY URGENCY 01/08/2010  . CAD, NATIVE VESSEL 11/20/2008  . OSTEOARTHRITIS, KNEES, BILATERAL, SEVERE 06/14/2008  . Hyperlipidemia 05/10/2007  . Essential hypertension 01/18/2007  . HYPOKALEMIA 04/30/2006  . Body mass index 60.0-69.9, adult ([place]) 04/30/2006  . HX, PERSONAL, PEPTIC ULCER DISEASE 04/30/2006   Past Medical History:  Diagnosis Date  . Anemia   . Asthma   . CAD (coronary artery disease)   . Carpal tunnel syndrome   . Cellulitis of both lower extremities 04/11/2015  . CHF (congestive heart failure) ([place])   . Chronic kidney disease    [facility]- followed by [facility]  . Colon polyp, hyperplastic 2007 & 2012  . Complication of anesthesia 1999   svt with renal calculi surgery, no problems since  . CTS (carpal tunnel syndrome)    bilateral  . Diabetes mellitus   . Eczema   . GERD (gastroesophageal reflux disease)   . History of kidney stones 1999  . Hyperlipidemia   . Hypertension   . Leg ulcer ([place]) 04/24/2015   Right lateral leg No evidence of an infection Monitor closely Keep edema controlled   . Leg ulcer ([place])    right lower  .  Meralgia paresthetica    Dr. [name]  . Morbid obesity ([place] [facility])   . Morbid obesity ([place])   . Neuropathy    toes and legs  . Osteoarthrosis, unspecified whether generalized or localized, lower leg    knee  . PUD (peptic ulcer disease)   . Shortness of breath dyspnea    with exertion  . Sleep apnea    per progress note 02/25/2018  . Type II or unspecified type diabetes mellitus without mention of complication, not stated as uncontrolled   . Unspecified hereditary and idiopathic peripheral neuropathy   . Vitamin B12 deficiency   . Wears glasses   . Wound cellulitis    right upper leg, healing well    Family History  Problem Relation Age of Onset  . Colon  cancer Mother   . Prostate cancer Father   . Colon cancer Father   . Diabetes Maternal Aunt   . Breast cancer Maternal Aunt   . Diabetes Maternal Uncle   . Diabetes Paternal Aunt   . Stroke Paternal Aunt        > 65  . Heart disease Paternal Aunt   . Diabetes Paternal Uncle   . Breast cancer Maternal Aunt         X 2  . Breast cancer Cousin     Past Surgical History:  Procedure Laterality Date  . ABDOMINAL HYSTERECTOMY    . CARDIAC CATHETERIZATION  2002   non obstructive disease  . colonoscopy with polypectomy  2007 & 2012    hyperplastic ;Dr Watt Climes  . COLONOSCOPY WITH PROPOFOL N/A 06/04/2015   Procedure: COLONOSCOPY WITH PROPOFOL;  Surgeon: Jerene Bears, MD;  Location: [facility];  Service: Gastroenterology;  Laterality: N/A;  . DEBRIDEMENT LEG Right 03/02/2018   WOUND VAC APPLIED  . DEBRIDEMENT LEG Right 05/27/2018   RIGHT LOWER LEG DEBRIDEMENT,  SKIN GRAFT, VAC PLACEMENT   . DILATION AND CURETTAGE OF UTERUS     multiple  . HEMORRHOID SURGERY    . I&D EXTREMITY Right 03/02/2018   Procedure: RIGHT LEG DEBRIDEMENT AND PLACE VAC;  Surgeon: [name], MD;  Location: [facility];  Service: Orthopedics;  Laterality: Right;  . I&D EXTREMITY Right 03/04/2018   Procedure: REPEAT IRRIGATION AND DEBRIDEMENT RIGHT LEG, PLACE WOUND VAC;   Surgeon: [name], MD;  Location: [facility];  Service: Orthopedics;  Laterality: Right;  . I&D EXTREMITY Right 03/30/2018   Procedure: IRRIGATION AND DEBRIDEMENT RIGHT LEG, APPLY WOUND VAC;  Surgeon: [name], MD;  Location: [facility];  Service: Orthopedics;  Laterality: Right;  . I&D EXTREMITY Right 05/27/2018   Procedure: RIGHT LOWER LEG DEBRIDEMENT,  SKIN GRAFT, VAC PLACEMENT;  Surgeon: [name], MD;  Location: [facility];  Service: Orthopedics;  Laterality: Right;  RIGHT LOWER LEG DEBRIDEMENT,  SKIN GRAFT, VAC PLACEMENT  . renal calculi  12/1997   SVT with induction of anesthesia  . right knee arthroscopy    . SKIN SPLIT GRAFT Right 03/04/2018   Procedure: POSSIBLE SPLIT THICKNESS SKIN GRAFT;  Surgeon: [name], MD;  Location: [facility];  Service: Orthopedics;  Laterality: Right;  . SKIN SPLIT GRAFT Right 04/01/2018   Procedure: REPEAT IRRIGATION AND DEBRIDEMENT RIGHT LEG, APPLY SPLIT THICKNESS SKIN GRAFT;  Surgeon: [name], MD;  Location: [facility];  Service: Orthopedics;  Laterality: Right;  . TONSILLECTOMY AND ADENOIDECTOMY     Social History   Occupational History  . Occupation: Disabled    Employer: RETIRED  Tobacco Use  . Smoking status: Never Smoker  . Smokeless tobacco: Never Used  Substance and Sexual Activity  . Alcohol use: No    Alcohol/week: 0.0 standard drinks  . Drug use: No  . Sexual activity: Not on file

## 2018-06-22 ENCOUNTER — Telehealth: Payer: Self-pay | Admitting: Internal Medicine

## 2018-06-22 NOTE — Telephone Encounter (Signed)
Copied from [place] [id number]. Topic: Quick Communication - Home Health Verbal Orders >> Jun 22, 2018  4:44 PM Mathis Bud wrote: Caller/Agency: Constance Haw from [facility]  Callback Number: [phone] Requesting PT Frequency: 1x 4 weeks

## 2018-06-23 ENCOUNTER — Telehealth: Payer: Self-pay

## 2018-06-23 NOTE — Telephone Encounter (Signed)
Ok for verbal 

## 2018-06-23 NOTE — Telephone Encounter (Signed)
Verbal orders given for below.  

## 2018-06-23 NOTE — Telephone Encounter (Signed)
Called and lm on vm for Kim to advise that this is ok.

## 2018-06-23 NOTE — Telephone Encounter (Signed)
Kim with [facility] would like to know if she can get an order for lab draw to be done on Friday, 06/24/2018 due to insufficient blood flow from patient.  Cb# is [phone].  Please advise.  Thank You.

## 2018-06-28 ENCOUNTER — Ambulatory Visit: Payer: Medicare Other

## 2018-06-29 DIAGNOSIS — I2510 Atherosclerotic heart disease of native coronary artery without angina pectoris: Secondary | ICD-10-CM

## 2018-06-29 DIAGNOSIS — G4733 Obstructive sleep apnea (adult) (pediatric): Secondary | ICD-10-CM

## 2018-06-29 DIAGNOSIS — I130 Hypertensive heart and chronic kidney disease with heart failure and stage 1 through stage 4 chronic kidney disease, or unspecified chronic kidney disease: Secondary | ICD-10-CM

## 2018-06-29 DIAGNOSIS — Z6841 Body Mass Index (BMI) 40.0 and over, adult: Secondary | ICD-10-CM

## 2018-06-29 DIAGNOSIS — L97912 Non-pressure chronic ulcer of unspecified part of right lower leg with fat layer exposed: Secondary | ICD-10-CM

## 2018-06-29 DIAGNOSIS — Z48817 Encounter for surgical aftercare following surgery on the skin and subcutaneous tissue: Secondary | ICD-10-CM

## 2018-06-29 DIAGNOSIS — I872 Venous insufficiency (chronic) (peripheral): Secondary | ICD-10-CM

## 2018-06-29 DIAGNOSIS — Z452 Encounter for adjustment and management of vascular access device: Secondary | ICD-10-CM

## 2018-06-29 DIAGNOSIS — I5032 Chronic diastolic (congestive) heart failure: Secondary | ICD-10-CM

## 2018-06-29 DIAGNOSIS — F339 Major depressive disorder, recurrent, unspecified: Secondary | ICD-10-CM

## 2018-06-29 DIAGNOSIS — Z993 Dependence on wheelchair: Secondary | ICD-10-CM

## 2018-06-29 DIAGNOSIS — B9562 Methicillin resistant Staphylococcus aureus infection as the cause of diseases classified elsewhere: Secondary | ICD-10-CM

## 2018-06-29 DIAGNOSIS — J42 Unspecified chronic bronchitis: Secondary | ICD-10-CM

## 2018-06-29 DIAGNOSIS — D519 Vitamin B12 deficiency anemia, unspecified: Secondary | ICD-10-CM

## 2018-06-29 DIAGNOSIS — Z5181 Encounter for therapeutic drug level monitoring: Secondary | ICD-10-CM

## 2018-06-29 DIAGNOSIS — R2689 Other abnormalities of gait and mobility: Secondary | ICD-10-CM

## 2018-06-29 DIAGNOSIS — N183 Chronic kidney disease, stage 3 (moderate): Secondary | ICD-10-CM

## 2018-06-29 DIAGNOSIS — R531 Weakness: Secondary | ICD-10-CM

## 2018-07-01 ENCOUNTER — Other Ambulatory Visit: Payer: Self-pay

## 2018-07-01 ENCOUNTER — Ambulatory Visit
Admission: RE | Admit: 2018-07-01 | Discharge: 2018-07-01 | Disposition: A | Discharge status: Home / Self Care | Payer: Medicare Other | Source: Ambulatory Visit | Attending: Obstetrics & Gynecology | Admitting: Obstetrics & Gynecology

## 2018-07-01 DIAGNOSIS — Z1231 Encounter for screening mammogram for malignant neoplasm of breast: Secondary | ICD-10-CM

## 2018-07-04 ENCOUNTER — Other Ambulatory Visit: Payer: Self-pay | Admitting: Obstetrics & Gynecology

## 2018-07-04 DIAGNOSIS — R928 Other abnormal and inconclusive findings on diagnostic imaging of breast: Secondary | ICD-10-CM

## 2018-07-05 ENCOUNTER — Telehealth: Payer: Self-pay

## 2018-07-05 ENCOUNTER — Ambulatory Visit (INDEPENDENT_AMBULATORY_CARE_PROVIDER_SITE_OTHER): Payer: Medicare Other | Admitting: Orthopedic Surgery

## 2018-07-05 ENCOUNTER — Other Ambulatory Visit: Payer: Self-pay

## 2018-07-05 DIAGNOSIS — I87331 Chronic venous hypertension (idiopathic) with ulcer and inflammation of right lower extremity: Secondary | ICD-10-CM | POA: Diagnosis not present

## 2018-07-05 DIAGNOSIS — L97919 Non-pressure chronic ulcer of unspecified part of right lower leg with unspecified severity: Secondary | ICD-10-CM

## 2018-07-05 NOTE — Telephone Encounter (Signed)
Called and left a VM for patient to return call concerning Diagnosis for appointment that she had in January.

## 2018-07-05 NOTE — Telephone Encounter (Signed)
Talked to patient to clarify appointment that she had in January 2020.

## 2018-07-06 ENCOUNTER — Telehealth: Payer: Self-pay | Admitting: Orthopedic Surgery

## 2018-07-06 NOTE — Telephone Encounter (Signed)
Patient's home health nurse coming today at [facility] for dressing care. Patient forgot to get dressing care instructions before she left appointment yesterday. Can you please call patient with those instructions so she can advise nurse?

## 2018-07-06 NOTE — Telephone Encounter (Signed)
Patient was called and informed that no new changes were made at this time. Continue to Dynaflex with silver cell per Dr [name].

## 2018-07-08 ENCOUNTER — Other Ambulatory Visit: Payer: Self-pay | Admitting: Obstetrics & Gynecology

## 2018-07-08 ENCOUNTER — Other Ambulatory Visit: Payer: Self-pay

## 2018-07-08 ENCOUNTER — Ambulatory Visit
Admission: RE | Admit: 2018-07-08 | Discharge: 2018-07-08 | Disposition: A | Discharge status: Home / Self Care | Payer: Medicare Other | Source: Ambulatory Visit | Attending: Obstetrics & Gynecology | Admitting: Obstetrics & Gynecology

## 2018-07-08 DIAGNOSIS — N6310 Unspecified lump in the right breast, unspecified quadrant: Secondary | ICD-10-CM

## 2018-07-08 DIAGNOSIS — R928 Other abnormal and inconclusive findings on diagnostic imaging of breast: Secondary | ICD-10-CM

## 2018-07-08 HISTORY — PX: BREAST BIOPSY: SHX20

## 2018-07-14 NOTE — Patient Instructions (Addendum)
  Tests ordered today. Your results will be released to [facility] (or called to you) after review, usually within 72hours after test completion. If any changes need to be made, you will be notified at that same time.   Medications reviewed and updated.  Changes include :  none   Your prescription(s) have been submitted to your pharmacy. Please take as directed and contact our office if you believe you are having problem(s) with the medication(s).  A referral was ordered for allergy.    Please followup in 6 months

## 2018-07-14 NOTE — Progress Notes (Signed)
Subjective:    Patient ID: April [name], female    DOB: 06-29-1950, 68 y.o.   MRN: [id number]  HPI The patient is here for follow up.  She is not exercising regularly.     Itching, bumps: She started having itching/hives in November 2019.  Initially she thought it was related to eating too many tomatoes.  A few days of prednisone when this first started did relieve the symptoms, but they have recurred.  She has it on the arms, neck and chest.    She saw derm and he gave her a cream and hydroxyzine at night.  She is also taken benadryl.  It has gotten worse recently and she is unsure why.  HFpEF, Hypertension: She is taking her medication daily. She is compliant with a low sodium diet.  She denies chest pain, palpitations,  and regular headaches. She does not monitor her blood pressure at home.    CKD, chronic leg edema:  She is following with nephrology.  Her legs are wrapped all the time.  She is taking lasix as prescribed.   Idiopathic chronic venous hypertension of RLE with ulcer:  She is following with Dr [name] and had skin grafting.    Diabetes: She is taking her medication daily as prescribed. She is compliant with a diabetic diet.   She denies numbness/tingling in her feet and foot lesions. She is up-to-date with an ophthalmology examination.   Hyperlipidemia: She is taking her medication daily. She is compliant with a low fat/cholesterol diet. She denies myalgias.   GERD:  She is taking her medication daily as prescribed.  She occasional GERD symptoms, but is taking her medication twice daily.      Medications and allergies reviewed with patient and updated if appropriate.  Patient Active Problem List   Diagnosis Date Noted  . Onychomycosis 05/19/2018  . Idiopathic chronic venous hypertension of right lower extremity with ulcer and inflammation ([place]) 05/19/2018  . Traumatic wound dehiscence   . Skin lumps 01/14/2018  . Bilateral leg cramps 12/14/2017  . Dry skin  dermatitis 06/29/2017  . Left shoulder pain 08/14/2016  . Meralgia paresthetica 07/16/2016  . Chronic left-sided low back pain with left-sided sciatica 06/02/2016  . Whole body pain 03/20/2016  . Diabetic peripheral neuropathy ([place]) 03/20/2016  . Osteopenia 01/11/2016  . CKD (chronic kidney disease) stage 3, GFR 30-59 ml/min (HCC) 01/02/2016  . Hand paresthesia 07/18/2015  . Carpal tunnel syndrome 06/07/2015  . Family history of colon cancer   . Diabetes mellitus with neurological manifestations ([place]) 04/18/2015  . Bilateral leg edema 04/11/2015  . Abnormality of gait 01/03/2015  . Hereditary and idiopathic peripheral neuropathy 01/03/2015  . GERD (gastroesophageal reflux disease) 06/17/2014  . Hx of colonic polyps 12/15/2012  . Vitamin B12 deficiency 11/03/2012  . Intrinsic asthma 03/23/2012  . Chronic diastolic heart failure ([place]) 02/20/2011  . OSA (obstructive sleep apnea) 09/17/2010  . URINARY URGENCY 01/08/2010  . CAD, NATIVE VESSEL 11/20/2008  . OSTEOARTHRITIS, KNEES, BILATERAL, SEVERE 06/14/2008  . Hyperlipidemia 05/10/2007  . Essential hypertension 01/18/2007  . HYPOKALEMIA 04/30/2006  . Body mass index 60.0-69.9, adult ([place]) 04/30/2006  . HX, PERSONAL, PEPTIC ULCER DISEASE 04/30/2006    Current Outpatient Medications on File Prior to Visit  Medication Sig Dispense Refill  . albuterol (PROVENTIL HFA) 108 (90 Base) MCG/ACT inhaler Inhale 2 puffs into the lungs every 4 (four) hours as needed for wheezing or shortness of breath. 1 Inhaler 4  . albuterol (PROVENTIL) (2.5 MG/3ML)  0.083% nebulizer solution Take 3 mLs (2.5 mg total) by nebulization every 6 (six) hours as needed for wheezing or shortness of breath. 75 mL 12  . Amino Acids-Protein Hydrolys (FEEDING SUPPLEMENT, PRO-STAT SUGAR FREE 64,) LIQD Take 30 mLs by mouth 3 (three) times daily.    Marland Kitchen aspirin (ECOTRIN LOW STRENGTH) 81 MG EC tablet Take 81 mg by mouth at bedtime.     . betamethasone dipropionate (DIPROLENE) 0.05  % cream Apply 1 application topically 2 (two) times daily as needed for rash.    . budesonide-formoterol (SYMBICORT) 160-4.5 MCG/ACT inhaler one - two inhalations every 12 hours; gargle and spit after use (Patient taking differently: Inhale 2 puffs into the lungs 2 (two) times daily as needed (shortness of breath). gargle and spit after use) 1 Inhaler 4  . calcium carbonate (TUMS - DOSED IN MG ELEMENTAL CALCIUM) 500 MG chewable tablet Chew 2 tablets by mouth daily as needed for indigestion or heartburn.    . COD LIVER OIL PO Take 1 capsule by mouth daily.    . cyanocobalamin (,VITAMIN B-12,) 1000 MCG/ML injection INJECT 1ML INTO MUSCLE EVERY 30 DAYS (Patient taking differently: Inject 1,000 mcg into the muscle every 30 (thirty) days. ) 10 mL 0  . diclofenac sodium (VOLTAREN) 1 % GEL Apply 2 g topically 4 (four) times daily. (Patient taking differently: Apply 2 g topically 4 (four) times daily as needed (pain). ) 100 g 11  . diphenhydrAMINE (BENADRYL) 12.5 MG/5ML liquid Take 25 mg by mouth daily as needed for itching.    . docusate sodium (COLACE) 100 MG capsule Take 1 capsule (100 mg total) by mouth 2 (two) times daily. (Patient taking differently: Take 100 mg by mouth 2 (two) times daily as needed for mild constipation. ) 10 capsule 0  . famotidine (PEPCID) 20 MG tablet Take 1 tablet (20 mg total) by mouth 2 (two) times daily. 180 tablet 3  . Flaxseed, Linseed, (FLAX SEEDS PO) Take 1 capsule by mouth 2 (two) times a day.    . folic acid (FOLVITE) 250 MCG tablet Take 400 mcg by mouth at bedtime.     . furosemide (LASIX) 40 MG tablet Take 3 tablets (120 mg total) by mouth 2 (two) times daily. 360 tablet 5  . hydrOXYzine (ATARAX/VISTARIL) 25 MG tablet Take 10 mg by mouth every 6 (six) hours as needed for itching.     . Lancets (ONETOUCH ULTRASOFT) lancets Use to help check blood sugars twice a day Dx E11.9 100 each 3  . Multiple Vitamin (MULTIVITAMIN WITH MINERALS) TABS tablet Take 1 tablet by mouth  daily.    Marland Kitchen NEEDLE, DISP, 23 G (EASY TOUCH FLIPLOCK NEEDLES) 23G X 1-1/2" MISC Use to inject B12 once monthly. 5 each 0  . Omega-3 Fatty Acids (FISH OIL) 1000 MG CAPS Take 1,000 mg by mouth daily.     . OXcarbazepine (TRILEPTAL) 150 MG tablet Take 1 tablet (150 mg total) by mouth 2 (two) times daily. 60 tablet 11  . oxyCODONE-acetaminophen (PERCOCET/ROXICET) 5-325 MG tablet Take 1 tablet by mouth every 6 (six) hours as needed for severe pain. 28 tablet 0  . polyethylene glycol (MIRALAX / GLYCOLAX) packet Take 17 g by mouth daily as needed for mild constipation. 14 each 0  . potassium chloride SA (K-DUR,KLOR-CON) 20 MEQ tablet TAKE 3 TABLETS BY MOUTH DAILY. (Patient taking differently: Take 60 mEq by mouth daily. ) 270 tablet 1  . silver sulfADIAZINE (SILVADENE) 1 % cream Apply 1 application topically daily. [place]  g 0  . simvastatin (ZOCOR) 40 MG tablet Take 1 tablet (40 mg total) by mouth at bedtime. 90 tablet 1  . spironolactone (ALDACTONE) 25 MG tablet TAKE 1 TABLET BY MOUTH ONCE DAILY (Patient taking differently: Take 25 mg by mouth daily. ) 90 tablet 1  . SYRINGE-NEEDLE, DISP, 3 ML 23G X 1" 3 ML MISC Use with B12 to inject IM every 30 days 50 each 0  . vancomycin (VANCOCIN) 10 G SOLR injection      No current facility-administered medications on file prior to visit.     Past Medical History:  Diagnosis Date  . Anemia   . Asthma   . CAD (coronary artery disease)   . Carpal tunnel syndrome   . Cellulitis of both lower extremities 04/11/2015  . CHF (congestive heart failure) ([place])   . Chronic kidney disease    CKI- followed by [facility]  . Colon polyp, hyperplastic 2007 & 2012  . Complication of anesthesia 1999   svt with renal calculi surgery, no problems since  . CTS (carpal tunnel syndrome)    bilateral  . Diabetes mellitus   . Eczema   . GERD (gastroesophageal reflux disease)   . History of kidney stones 1999  . Hyperlipidemia   . Hypertension   . Leg ulcer ([place]) 04/24/2015    Right lateral leg No evidence of an infection Monitor closely Keep edema controlled   . Leg ulcer ([place])    right lower  . Meralgia paresthetica    Dr. [name]  . Morbid obesity ([place])   . Morbid obesity ([place])   . Neuropathy    toes and legs  . Osteoarthrosis, unspecified whether generalized or localized, lower leg    knee  . PUD (peptic ulcer disease)   . Shortness of breath dyspnea    with exertion  . Sleep apnea    per progress note 02/25/2018  . Type II or unspecified type diabetes mellitus without mention of complication, not stated as uncontrolled   . Unspecified hereditary and idiopathic peripheral neuropathy   . Vitamin B12 deficiency   . Wears glasses   . Wound cellulitis    right upper leg, healing well    Past Surgical History:  Procedure Laterality Date  . ABDOMINAL HYSTERECTOMY    . CARDIAC CATHETERIZATION  2002   non obstructive disease  . colonoscopy with polypectomy  2007 & 2012    hyperplastic ;Dr Watt Climes  . COLONOSCOPY WITH PROPOFOL N/A 06/04/2015   Procedure: COLONOSCOPY WITH PROPOFOL;  Surgeon: Jerene Bears, MD;  Location: [facility];  Service: Gastroenterology;  Laterality: N/A;  . DEBRIDEMENT LEG Right 03/02/2018   WOUND VAC APPLIED  . DEBRIDEMENT LEG Right 05/27/2018   RIGHT LOWER LEG DEBRIDEMENT,  SKIN GRAFT, VAC PLACEMENT   . DILATION AND CURETTAGE OF UTERUS     multiple  . HEMORRHOID SURGERY    . I&D EXTREMITY Right 03/02/2018   Procedure: RIGHT LEG DEBRIDEMENT AND PLACE VAC;  Surgeon: [name], MD;  Location: [facility];  Service: Orthopedics;  Laterality: Right;  . I&D EXTREMITY Right 03/04/2018   Procedure: REPEAT IRRIGATION AND DEBRIDEMENT RIGHT LEG, PLACE WOUND VAC;  Surgeon: [name], MD;  Location: [facility];  Service: Orthopedics;  Laterality: Right;  . I&D EXTREMITY Right 03/30/2018   Procedure: IRRIGATION AND DEBRIDEMENT RIGHT LEG, APPLY WOUND VAC;  Surgeon: [name], MD;  Location: [facility];  Service: Orthopedics;  Laterality: Right;  .  I&D EXTREMITY Right 05/27/2018  Procedure: RIGHT LOWER LEG DEBRIDEMENT,  SKIN GRAFT, VAC PLACEMENT;  Surgeon: [name], MD;  Location: [facility];  Service: Orthopedics;  Laterality: Right;  RIGHT LOWER LEG DEBRIDEMENT,  SKIN GRAFT, VAC PLACEMENT  . renal calculi  12/1997   SVT with induction of anesthesia  . right knee arthroscopy    . SKIN SPLIT GRAFT Right 03/04/2018   Procedure: POSSIBLE SPLIT THICKNESS SKIN GRAFT;  Surgeon: [name], MD;  Location: [facility];  Service: Orthopedics;  Laterality: Right;  . SKIN SPLIT GRAFT Right 04/01/2018   Procedure: REPEAT IRRIGATION AND DEBRIDEMENT RIGHT LEG, APPLY SPLIT THICKNESS SKIN GRAFT;  Surgeon: [name], MD;  Location: [facility];  Service: Orthopedics;  Laterality: Right;  . TONSILLECTOMY AND ADENOIDECTOMY      Social History   Socioeconomic History  . Marital status: Married    Spouse name: [name]  . Number of children: 1  . Years of education: BS  . Highest education level: Not on file  Occupational History  . Occupation: Disabled    Employer: RETIRED  Social Needs  . Financial resource strain: Not on file  . Food insecurity    Worry: Not on file    Inability: Not on file  . Transportation needs    Medical: Not on file    Non-medical: Not on file  Tobacco Use  . Smoking status: Never Smoker  . Smokeless tobacco: Never Used  Substance and Sexual Activity  . Alcohol use: No    Alcohol/week: 0.0 standard drinks  . Drug use: No  . Sexual activity: Not on file  Lifestyle  . Physical activity    Days per week: Not on file    Minutes per session: Not on file  . Stress: Not on file  Relationships  . Social Herbalist on phone: Not on file    Gets together: Not on file    Attends religious service: Not on file    Active member of club or organization: Not on file    Attends meetings of clubs or organizations: Not on file    Relationship status: Not on file  Other Topics Concern  . Not on file  Social History  Narrative   Patient lives at home with her husband [name] Patient is retired and has a Conservation officer, nature.    Caffeine - some times.   Right handed.    Family History  Problem Relation Age of Onset  . Colon cancer Mother   . Prostate cancer Father   . Colon cancer Father   . Diabetes Maternal Aunt   . Breast cancer Maternal Aunt   . Diabetes Maternal Uncle   . Diabetes Paternal Aunt   . Stroke Paternal Aunt        > 65  . Heart disease Paternal Aunt   . Diabetes Paternal Uncle   . Breast cancer Maternal Aunt         X 2  . Breast cancer Cousin     Review of Systems  Constitutional: Negative for chills and fever.  Respiratory: Positive for shortness of breath (with exertion - no change). Negative for cough and wheezing.   Cardiovascular: Positive for leg swelling. Negative for chest pain and palpitations.  Gastrointestinal: Negative for abdominal pain, constipation, diarrhea and nausea.  Neurological: Positive for light-headedness (if she gets up quick). Negative for headaches.       Objective:   Vitals:   07/15/18 0937  BP: 118/72  Pulse: 69  Resp: 18  Temp: 98.7 F (37.1 C)  SpO2: 94%   BP Readings from Last 3 Encounters:  07/15/18 118/72  06/03/18 (!) 117/56  04/05/18 (!) 104/47   Wt Readings from Last 3 Encounters:  06/21/18 (!) 360 lb (163.3 kg)  06/07/18 (!) 360 lb 0.2 oz (163.3 kg)  05/27/18 (!) 360 lb 0.2 oz (163.3 kg)   Body mass index is 61.79 kg/m.   Physical Exam    Constitutional: Appears well-developed and well-nourished. No distress.  HENT:  Head: Normocephalic and atraumatic.  Neck: Neck supple. No tracheal deviation present. No thyromegaly present.  No cervical lymphadenopathy Cardiovascular: Normal rate, regular rhythm and normal heart sounds.   No murmur heard. No carotid bruit .  Unable to evaluate edema-legs currently wrapped Pulmonary/Chest: Effort normal and breath sounds normal. No respiratory distress. No has no wheezes.  No rales.  Skin: Skin is warm and dry. Not diaphoretic.  Psychiatric: Normal mood and affect. Behavior is normal.    Diabetic Foot Exam - Simple   Simple Foot Form Diabetic Foot exam was performed with the following findings: Yes 07/15/2018 12:43 PM  Visual Inspection No deformities, no ulcerations, no other skin breakdown bilaterally: Yes See comments: Yes Sensation Testing See comments: Yes Pulse Check Posterior Tibialis and Dorsalis pulse intact bilaterally: Yes Comments Current large, nonhealing ulcer right lower leg.  Legs currently wrapped-unable to assess edema.  Onychomycosis.  Painful left first toenail.  Intact touch to light touch.  Flat feet.       Assessment & Plan:    See Problem List for Assessment and Plan of chronic medical problems.

## 2018-07-15 ENCOUNTER — Encounter: Payer: Self-pay | Admitting: Orthopedic Surgery

## 2018-07-15 ENCOUNTER — Ambulatory Visit
Admission: RE | Admit: 2018-07-15 | Discharge: 2018-07-15 | Disposition: A | Discharge status: Home / Self Care | Payer: Medicare Other | Source: Ambulatory Visit | Attending: Obstetrics & Gynecology | Admitting: Obstetrics & Gynecology

## 2018-07-15 ENCOUNTER — Encounter: Payer: Self-pay | Admitting: Internal Medicine

## 2018-07-15 ENCOUNTER — Other Ambulatory Visit (INDEPENDENT_AMBULATORY_CARE_PROVIDER_SITE_OTHER): Payer: Medicare Other

## 2018-07-15 ENCOUNTER — Other Ambulatory Visit: Payer: Self-pay

## 2018-07-15 ENCOUNTER — Ambulatory Visit (INDEPENDENT_AMBULATORY_CARE_PROVIDER_SITE_OTHER): Payer: Medicare Other | Admitting: Internal Medicine

## 2018-07-15 VITALS — BP 118/72 | HR 69 | Temp 98.7°F | Resp 18 | Ht 64.0 in

## 2018-07-15 DIAGNOSIS — K219 Gastro-esophageal reflux disease without esophagitis: Secondary | ICD-10-CM

## 2018-07-15 DIAGNOSIS — E782 Mixed hyperlipidemia: Secondary | ICD-10-CM

## 2018-07-15 DIAGNOSIS — N183 Chronic kidney disease, stage 3 unspecified: Secondary | ICD-10-CM

## 2018-07-15 DIAGNOSIS — L509 Urticaria, unspecified: Secondary | ICD-10-CM

## 2018-07-15 DIAGNOSIS — I5032 Chronic diastolic (congestive) heart failure: Secondary | ICD-10-CM | POA: Diagnosis not present

## 2018-07-15 DIAGNOSIS — I10 Essential (primary) hypertension: Secondary | ICD-10-CM

## 2018-07-15 DIAGNOSIS — N6310 Unspecified lump in the right breast, unspecified quadrant: Secondary | ICD-10-CM

## 2018-07-15 DIAGNOSIS — E1140 Type 2 diabetes mellitus with diabetic neuropathy, unspecified: Secondary | ICD-10-CM

## 2018-07-15 DIAGNOSIS — L299 Pruritus, unspecified: Secondary | ICD-10-CM

## 2018-07-15 DIAGNOSIS — L97919 Non-pressure chronic ulcer of unspecified part of right lower leg with unspecified severity: Secondary | ICD-10-CM

## 2018-07-15 DIAGNOSIS — I87331 Chronic venous hypertension (idiopathic) with ulcer and inflammation of right lower extremity: Secondary | ICD-10-CM

## 2018-07-15 DIAGNOSIS — R600 Localized edema: Secondary | ICD-10-CM

## 2018-07-15 LAB — COMPREHENSIVE METABOLIC PANEL
ALT: 13 U/L (ref 0–35)
AST: 18 U/L (ref 0–37)
Albumin: 3.7 g/dL (ref 3.5–5.2)
Alkaline Phosphatase: 85 U/L (ref 39–117)
BUN: 23 mg/dL (ref 6–23)
CO2: 32 mEq/L (ref 19–32)
Calcium: 9.6 mg/dL (ref 8.4–10.5)
Chloride: 99 mEq/L (ref 96–112)
Creatinine, Ser: 2.17 mg/dL — ABNORMAL HIGH (ref 0.40–1.20)
GFR: 27.28 mL/min — ABNORMAL LOW (ref >60.00)
Glucose, Bld: 116 mg/dL — ABNORMAL HIGH (ref 70–99)
Potassium: 4.6 mEq/L (ref 3.5–5.1)
Sodium: 138 mEq/L (ref 135–145)
Total Bilirubin: 0.5 mg/dL (ref 0.2–1.2)
Total Protein: 6.8 g/dL (ref 6.0–8.3)

## 2018-07-15 LAB — CBC WITH DIFFERENTIAL/PLATELET
Basophils Absolute: 0.1 10*3/uL (ref 0.0–0.1)
Basophils Relative: 2.4 % (ref 0.0–3.0)
Eosinophils Absolute: 0.6 10*3/uL (ref 0.0–0.7)
Eosinophils Relative: 14.3 % — ABNORMAL HIGH (ref 0.0–5.0)
HCT: 36.2 % (ref 36.0–46.0)
Hemoglobin: 11.5 g/dL — ABNORMAL LOW (ref 12.0–15.0)
Lymphocytes Relative: 23.1 % (ref 12.0–46.0)
Lymphs Abs: 1 10*3/uL (ref 0.7–4.0)
MCHC: 31.7 g/dL (ref 30.0–36.0)
MCV: 88.4 fl (ref 78.0–100.0)
Monocytes Absolute: 0.5 10*3/uL (ref 0.1–1.0)
Monocytes Relative: 11.2 % (ref 3.0–12.0)
Neutro Abs: 2.2 10*3/uL (ref 1.4–7.7)
Neutrophils Relative %: 49 % (ref 43.0–77.0)
Platelets: 183 10*3/uL (ref 150.0–400.0)
RBC: 4.1 Mil/uL (ref 3.87–5.11)
RDW: 18.1 % — ABNORMAL HIGH (ref 11.5–15.5)
WBC: 4.5 10*3/uL (ref 4.0–10.5)

## 2018-07-15 LAB — LIPID PANEL
Cholesterol: 154 mg/dL (ref 0–200)
HDL: 42.5 mg/dL (ref >39.00)
LDL Cholesterol: 93 mg/dL (ref 0–99)
NonHDL: 111.1
Total CHOL/HDL Ratio: 4
Triglycerides: 89 mg/dL (ref 0.0–149.0)
VLDL: 17.8 mg/dL (ref 0.0–40.0)

## 2018-07-15 LAB — HEMOGLOBIN A1C: Hgb A1c MFr Bld: 6.4 % (ref 4.6–6.5)

## 2018-07-15 MED ORDER — CARVEDILOL 25 MG PO TABS: 25.0000 mg | ORAL_TABLET | Freq: Two times a day (BID) | ORAL | 1 refills | Status: DC

## 2018-07-15 NOTE — Assessment & Plan Note (Signed)
Continue statin Check lipid panel, CMP 

## 2018-07-15 NOTE — Assessment & Plan Note (Signed)
BP well controlled Current regimen effective and well tolerated Continue current medications at current doses CMP 

## 2018-07-15 NOTE — Assessment & Plan Note (Signed)
Diet controlled.  Check A1c.

## 2018-07-15 NOTE — Assessment & Plan Note (Signed)
Stable Has her legs wrapped, which is making a significant difference Taking Lasix as prescribed twice daily CMP

## 2018-07-15 NOTE — Assessment & Plan Note (Signed)
Following with nephrology CMP

## 2018-07-15 NOTE — Assessment & Plan Note (Signed)
Stressed the importance of GERD diet and not eating too late Continue current medication

## 2018-07-15 NOTE — Assessment & Plan Note (Signed)
Appears euvolemic here today Following with nephrology and cardiology Continue current dose of furosemide CMP

## 2018-07-15 NOTE — Assessment & Plan Note (Signed)
Following with Dr. [name] Ulcer is healing slowly

## 2018-07-15 NOTE — Progress Notes (Signed)
Office Visit Note   Patient: April [name]           Date of Birth: 25-Dec-1950           MRN: [id number] Visit Date: 07/05/2018              Requested by: [name], MD [place],  [place] [address] PCP: [name], MD  Chief Complaint  Patient presents with  . Right Leg - Follow-up      HPI: Patient is a 68 year old woman who presents for evaluation of both lower extremities she has been having a compression wrap on her right leg changed 3 times a week by home health nursing.  Assessment & Plan: Visit Diagnoses:  1. Idiopathic chronic venous hypertension of right lower extremity with ulcer and inflammation ([place])     Plan: We will reapply Dynaflex wrap for both legs.  Continue with home health nursing dressing changes.  Follow-Up Instructions: Return in about 3 weeks (around 07/26/2018).   Ortho Exam  Patient is alert, oriented, no adenopathy, well-dressed, normal affect, normal respiratory effort. Examination patient has no ulcers on the left leg swelling is decreased she also has decreased swelling of the right leg venous stasis ulcer measures 3 x 10 cm and has good healthy granulation tissue the wound is flat no odor no drainage no signs of infection.  Imaging: No results found. No images are attached to the encounter.  Labs: Lab Results  Component Value Date   HGBA1C 6.4 07/15/2018   HGBA1C 6.1 (H) 05/27/2018   HGBA1C 6.5 12/03/2017   ESRSEDRATE 56 (H) 03/09/2018   ESRSEDRATE 13 06/02/2016   ESRSEDRATE 24 (H) 01/23/2011   CRP 6.3 (H) 03/09/2018   CRP 2.2 06/02/2016   REPTSTATUS 06/07/2018 FINAL 06/02/2018   REPTSTATUS 06/07/2018 FINAL 06/02/2018   GRAMSTAIN  05/27/2018    FEW WBC PRESENT, PREDOMINANTLY PMN MODERATE GRAM POSITIVE RODS RARE GRAM POSITIVE COCCI IN PAIRS    CULT  06/02/2018    NO GROWTH 5 DAYS Performed at [facility], [place] [address], [place], [place] [address]    CULT  06/02/2018    NO GROWTH 5 DAYS Performed  at [facility] [address], [place], [place] [address]    LABORGA METHICILLIN RESISTANT STAPHYLOCOCCUS AUREUS 05/27/2018     Lab Results  Component Value Date   ALBUMIN 3.7 07/15/2018   ALBUMIN 2.2 (L) 03/08/2018   ALBUMIN 4.0 12/14/2017    There is no height or weight on file to calculate BMI.  Orders:  No orders of the defined types were placed in this encounter.  No orders of the defined types were placed in this encounter.    Procedures: No procedures performed  Clinical Data: No additional findings.  ROS:  All other systems negative, except as noted in the HPI. Review of Systems  Objective: Vital Signs: There were no vitals taken for this visit.  Specialty Comments:  No specialty comments available.  PMFS History: Patient Active Problem List   Diagnosis Date Noted  . Onychomycosis 05/19/2018  . Idiopathic chronic venous hypertension of right lower extremity with ulcer and inflammation ([place]) 05/19/2018  . Traumatic wound dehiscence   . Skin lumps 01/14/2018  . Bilateral leg cramps 12/14/2017  . Dry skin dermatitis 06/29/2017  . Left shoulder pain 08/14/2016  . Meralgia paresthetica 07/16/2016  . Chronic left-sided low back pain with left-sided sciatica 06/02/2016  . Whole body pain 03/20/2016  .  Diabetic peripheral neuropathy ([place]) 03/20/2016  . Osteopenia 01/11/2016  . CKD (chronic kidney disease) stage 3, GFR 30-59 ml/min ([facility]) 01/02/2016  . Hand paresthesia 07/18/2015  . Carpal tunnel syndrome 06/07/2015  . Family history of colon cancer   . Diabetes mellitus with neurological manifestations ([place]) 04/18/2015  . Bilateral leg edema 04/11/2015  . Abnormality of gait 01/03/2015  . Hereditary and idiopathic peripheral neuropathy 01/03/2015  . GERD (gastroesophageal reflux disease) 06/17/2014  . Hx of colonic polyps 12/15/2012  . Vitamin B12 deficiency 11/03/2012  . Intrinsic asthma 03/23/2012  . Chronic diastolic heart failure ([place])  02/20/2011  . OSA (obstructive sleep apnea) 09/17/2010  . URINARY URGENCY 01/08/2010  . CAD, NATIVE VESSEL 11/20/2008  . OSTEOARTHRITIS, KNEES, BILATERAL, SEVERE 06/14/2008  . Hyperlipidemia 05/10/2007  . Essential hypertension 01/18/2007  . HYPOKALEMIA 04/30/2006  . Body mass index 60.0-69.9, adult ([place]) 04/30/2006  . HX, PERSONAL, PEPTIC ULCER DISEASE 04/30/2006   Past Medical History:  Diagnosis Date  . Anemia   . Asthma   . CAD (coronary artery disease)   . Carpal tunnel syndrome   . Cellulitis of both lower extremities 04/11/2015  . CHF (congestive heart failure) ([place])   . Chronic kidney disease    CKI- followed by [facility]  . Colon polyp, hyperplastic 2007 & 2012  . Complication of anesthesia 1999   svt with renal calculi surgery, no problems since  . CTS (carpal tunnel syndrome)    bilateral  . Diabetes mellitus   . Eczema   . GERD (gastroesophageal reflux disease)   . History of kidney stones 1999  . Hyperlipidemia   . Hypertension   . Leg ulcer ([place]) 04/24/2015   Right lateral leg No evidence of an infection Monitor closely Keep edema controlled   . Leg ulcer ([place])    right lower  . Meralgia paresthetica    Dr. [name]  . Morbid obesity ([place])   . Morbid obesity ([place])   . Neuropathy    toes and legs  . Osteoarthrosis, unspecified whether generalized or localized, lower leg    knee  . PUD (peptic ulcer disease)   . Shortness of breath dyspnea    with exertion  . Sleep apnea    per progress note 02/25/2018  . Type II or unspecified type diabetes mellitus without mention of complication, not stated as uncontrolled   . Unspecified hereditary and idiopathic peripheral neuropathy   . Vitamin B12 deficiency   . Wears glasses   . Wound cellulitis    right upper leg, healing well    Family History  Problem Relation Age of Onset  . Colon cancer Mother   . Prostate cancer Father   . Colon cancer Father   . Diabetes Maternal Aunt   . Breast cancer Maternal  Aunt   . Diabetes Maternal Uncle   . Diabetes Paternal Aunt   . Stroke Paternal Aunt        > 65  . Heart disease Paternal Aunt   . Diabetes Paternal Uncle   . Breast cancer Maternal Aunt         X 2  . Breast cancer Cousin     Past Surgical History:  Procedure Laterality Date  . ABDOMINAL HYSTERECTOMY    . CARDIAC CATHETERIZATION  2002   non obstructive disease  . colonoscopy with polypectomy  2007 & 2012    hyperplastic ;Dr Watt Climes  . COLONOSCOPY WITH PROPOFOL N/A 06/04/2015   Procedure: COLONOSCOPY WITH PROPOFOL;  Surgeon: Lajuan Lines  Pyrtle, MD;  [facility];  Service: Gastroenterology;  Laterality: N/A;  . DEBRIDEMENT LEG Right 03/02/2018   WOUND VAC APPLIED  . DEBRIDEMENT LEG Right 05/27/2018   RIGHT LOWER LEG DEBRIDEMENT,  SKIN GRAFT, VAC PLACEMENT   . DILATION AND CURETTAGE OF UTERUS     multiple  . HEMORRHOID SURGERY    . I&D EXTREMITY Right 03/02/2018   Procedure: RIGHT LEG DEBRIDEMENT AND PLACE VAC;  Surgeon: [name], MD;  Location: [facility];  Service: Orthopedics;  Laterality: Right;  . I&D EXTREMITY Right 03/04/2018   Procedure: REPEAT IRRIGATION AND DEBRIDEMENT RIGHT LEG, PLACE WOUND VAC;  Surgeon: [name], MD;  Location: [facility];  Service: Orthopedics;  Laterality: Right;  . I&D EXTREMITY Right 03/30/2018   Procedure: IRRIGATION AND DEBRIDEMENT RIGHT LEG, APPLY WOUND VAC;  Surgeon: [name], MD;  Location: [facility];  Service: Orthopedics;  Laterality: Right;  . I&D EXTREMITY Right 05/27/2018   Procedure: RIGHT LOWER LEG DEBRIDEMENT,  SKIN GRAFT, VAC PLACEMENT;  Surgeon: [name], MD;  Location: [facility];  Service: Orthopedics;  Laterality: Right;  RIGHT LOWER LEG DEBRIDEMENT,  SKIN GRAFT, VAC PLACEMENT  . renal calculi  12/1997   SVT with induction of anesthesia  . right knee arthroscopy    . SKIN SPLIT GRAFT Right 03/04/2018   Procedure: POSSIBLE SPLIT THICKNESS SKIN GRAFT;  Surgeon: [name], MD;  Location: [facility];  Service: Orthopedics;   Laterality: Right;  . SKIN SPLIT GRAFT Right 04/01/2018   Procedure: REPEAT IRRIGATION AND DEBRIDEMENT RIGHT LEG, APPLY SPLIT THICKNESS SKIN GRAFT;  Surgeon: [name], MD;  Location: [facility];  Service: Orthopedics;  Laterality: Right;  . TONSILLECTOMY AND ADENOIDECTOMY     Social History   Occupational History  . Occupation: Disabled    Employer: RETIRED  Tobacco Use  . Smoking status: Never Smoker  . Smokeless tobacco: Never Used  Substance and Sexual Activity  . Alcohol use: No    Alcohol/week: 0.0 standard drinks  . Drug use: No  . Sexual activity: Not on file

## 2018-07-16 ENCOUNTER — Other Ambulatory Visit: Payer: Self-pay | Admitting: Internal Medicine

## 2018-07-21 ENCOUNTER — Telehealth: Payer: Self-pay | Admitting: *Deleted

## 2018-07-21 NOTE — Telephone Encounter (Signed)
The hydroxyzine and benadryl are the best things for itching.  The only other thing I could prescribe is prednisone - a taper for a few days.     One thing for her to look into is the newest medication that she had started prior to the itching starting was Trileptal -- I would recommend calling her neurologist and seeing if that is a common side effect of the medication - stopping it a consideration

## 2018-07-21 NOTE — Telephone Encounter (Signed)
Pt informed of below and verbalized understanding.  

## 2018-07-21 NOTE — Telephone Encounter (Signed)
Patient c/o itching all over. She states she wakes up itching and can't sleep because of itching. She states the medication and cream from dermatology is not helping. She knows she is to see allergist, but is wondering if you can advise or give her something else until she sees allergist. Please advise.

## 2018-07-26 ENCOUNTER — Ambulatory Visit (INDEPENDENT_AMBULATORY_CARE_PROVIDER_SITE_OTHER): Payer: Medicare Other | Admitting: Orthopedic Surgery

## 2018-07-26 ENCOUNTER — Other Ambulatory Visit: Payer: Self-pay

## 2018-07-26 ENCOUNTER — Encounter: Payer: Self-pay | Admitting: Orthopedic Surgery

## 2018-07-26 VITALS — Ht 64.0 in | Wt 360.0 lb

## 2018-07-26 DIAGNOSIS — L97919 Non-pressure chronic ulcer of unspecified part of right lower leg with unspecified severity: Secondary | ICD-10-CM | POA: Diagnosis not present

## 2018-07-26 DIAGNOSIS — I87331 Chronic venous hypertension (idiopathic) with ulcer and inflammation of right lower extremity: Secondary | ICD-10-CM | POA: Diagnosis not present

## 2018-07-27 ENCOUNTER — Encounter: Payer: Self-pay | Admitting: Orthopedic Surgery

## 2018-07-27 NOTE — Progress Notes (Signed)
Office Visit Note   Patient: April [name]           Date of Birth: [birth year]           MRN: [id number] Visit Date: 07/26/2018              Requested by: [name], MD [place],  [place] [address] PCP: [name], MD  Chief Complaint  Patient presents with  . Right Leg - Routine Post Op    05/27/18 RLE Deb, [facility]      HPI: Patient is a 68 year old woman who was 2 months status post irrigation debridement and skin graft to the right lower extremity.  Assessment & Plan: Visit Diagnoses:  1. Idiopathic chronic venous hypertension of right lower extremity with ulcer and inflammation (HCC)     Plan: She will continue with the compression wraps 3 times a week at home Dynaflex wraps to both legs at this time.  Follow-Up Instructions: Return in about 3 weeks (around 08/16/2018).   Ortho Exam  Patient is alert, oriented, no adenopathy, well-dressed, normal affect, normal respiratory effort. Examination patient has decreased swelling of both lower extremities.  She does have a rash on her chest and upper extremities she is currently not on any antibiotics.  Patient states she is going to an allergist to see if that is related to any of her existing medications.  The ulcer on the right leg is 20 x 80 mm on the right calf this continues to show good improvement.  Imaging: No results found. No images are attached to the encounter.  Labs: Lab Results  Component Value Date   HGBA1C 6.4 07/15/2018   HGBA1C 6.1 (H) 05/27/2018   HGBA1C 6.5 12/03/2017   ESRSEDRATE 56 (H) 03/09/2018   ESRSEDRATE 13 06/02/2016   ESRSEDRATE 24 (H) 01/23/2011   CRP 6.3 (H) 03/09/2018   CRP 2.2 06/02/2016   REPTSTATUS 06/07/2018 FINAL 06/02/2018   REPTSTATUS 06/07/2018 FINAL 06/02/2018   GRAMSTAIN  05/27/2018    FEW WBC PRESENT, PREDOMINANTLY PMN MODERATE GRAM POSITIVE RODS RARE GRAM POSITIVE COCCI IN PAIRS    CULT  06/02/2018    NO GROWTH 5 DAYS Performed at [facility], [place] [address], [place], [place] [address]    CULT  06/02/2018    NO GROWTH 5 DAYS Performed at [facility] [address], [place], [place] [address]    LABORGA METHICILLIN RESISTANT STAPHYLOCOCCUS AUREUS 05/27/2018     Lab Results  Component Value Date   ALBUMIN 3.7 07/15/2018   ALBUMIN 2.2 (L) 03/08/2018   ALBUMIN 4.0 12/14/2017    Body mass index is 61.79 kg/m.  Orders:  No orders of the defined types were placed in this encounter.  No orders of the defined types were placed in this encounter.    Procedures: No procedures performed  Clinical Data: No additional findings.  ROS:  All other systems negative, except as noted in the HPI. Review of Systems  Objective: Vital Signs: Ht 5\' 4"  (1.626 m)   Wt (!) 360 lb (163.3 kg)   BMI 61.79 kg/m   Specialty Comments:  No specialty comments available.  PMFS History: Patient Active Problem List   Diagnosis Date Noted  . Onychomycosis 05/19/2018  . Idiopathic chronic venous hypertension of right lower extremity with ulcer and inflammation ([place]) 05/19/2018  . Traumatic wound dehiscence   . Skin lumps 01/14/2018  . Bilateral leg cramps 12/14/2017  . Dry skin  dermatitis 06/29/2017  . Left shoulder pain 08/14/2016  . Meralgia paresthetica 07/16/2016  . Chronic left-sided low back pain with left-sided sciatica 06/02/2016  . Whole body pain 03/20/2016  . Diabetic peripheral neuropathy ([place]) 03/20/2016  . Osteopenia 01/11/2016  . CKD (chronic kidney disease) stage 3, GFR 30-59 ml/min (HCC) 01/02/2016  . Hand paresthesia 07/18/2015  . Carpal tunnel syndrome 06/07/2015  . Family history of colon cancer   . Diabetes mellitus with neurological manifestations ([place]) 04/18/2015  . Bilateral leg edema 04/11/2015  . Abnormality of gait 01/03/2015  . Hereditary and idiopathic peripheral neuropathy 01/03/2015  . GERD (gastroesophageal reflux disease) 06/17/2014  . Hx of colonic polyps 12/15/2012  .  Vitamin B12 deficiency 11/03/2012  . Intrinsic asthma 03/23/2012  . Chronic diastolic heart failure ([place]) 02/20/2011  . OSA (obstructive sleep apnea) 09/17/2010  . URINARY URGENCY 01/08/2010  . CAD, NATIVE VESSEL 11/20/2008  . OSTEOARTHRITIS, KNEES, BILATERAL, SEVERE 06/14/2008  . Hyperlipidemia 05/10/2007  . Essential hypertension 01/18/2007  . HYPOKALEMIA 04/30/2006  . Body mass index 60.0-69.9, adult ([place]) 04/30/2006  . HX, PERSONAL, PEPTIC ULCER DISEASE 04/30/2006   Past Medical History:  Diagnosis Date  . Anemia   . Asthma   . CAD (coronary artery disease)   . Carpal tunnel syndrome   . Cellulitis of both lower extremities 04/11/2015  . CHF (congestive heart failure) ([employer])   . Chronic kidney disease    [facility]- followed by [facility]  . Colon polyp, hyperplastic 2007 & 2012  . Complication of anesthesia 1999   svt with renal calculi surgery, no problems since  . CTS (carpal tunnel syndrome)    bilateral  . Diabetes mellitus   . Eczema   . GERD (gastroesophageal reflux disease)   . History of kidney stones 1999  . Hyperlipidemia   . Hypertension   . Leg ulcer ([place]) 04/24/2015   Right lateral leg No evidence of an infection Monitor closely Keep edema controlled   . Leg ulcer ([place])    right lower  . Meralgia paresthetica    Dr. [name]  . Morbid obesity ([place])   . Morbid obesity ([place])   . Neuropathy    toes and legs  . Osteoarthrosis, unspecified whether generalized or localized, lower leg    knee  . PUD (peptic ulcer disease)   . Shortness of breath dyspnea    with exertion  . Sleep apnea    per progress note 02/25/2018  . Type II or unspecified type diabetes mellitus without mention of complication, not stated as uncontrolled   . Unspecified hereditary and idiopathic peripheral neuropathy   . Vitamin B12 deficiency   . Wears glasses   . Wound cellulitis    right upper leg, healing well    Family History  Problem Relation Age of Onset  . Colon cancer Mother   .  Prostate cancer Father   . Colon cancer Father   . Diabetes Maternal Aunt   . Breast cancer Maternal Aunt   . Diabetes Maternal Uncle   . Diabetes Paternal Aunt   . Stroke Paternal Aunt        > 65  . Heart disease Paternal Aunt   . Diabetes Paternal Uncle   . Breast cancer Maternal Aunt         X 2  . Breast cancer Cousin     Past Surgical History:  Procedure Laterality Date  . ABDOMINAL HYSTERECTOMY    . CARDIAC CATHETERIZATION  2002   non obstructive disease  .  colonoscopy with polypectomy  2007 & 2012    hyperplastic ;Dr Watt Climes  . COLONOSCOPY WITH PROPOFOL N/A 06/04/2015   Procedure: COLONOSCOPY WITH PROPOFOL;  Surgeon: Jerene Bears, MD;  Location: [facility];  Service: Gastroenterology;  Laterality: N/A;  . DEBRIDEMENT LEG Right 03/02/2018   WOUND VAC APPLIED  . DEBRIDEMENT LEG Right 05/27/2018   RIGHT LOWER LEG DEBRIDEMENT,  SKIN GRAFT, VAC PLACEMENT   . DILATION AND CURETTAGE OF UTERUS     multiple  . HEMORRHOID SURGERY    . I&D EXTREMITY Right 03/02/2018   Procedure: RIGHT LEG DEBRIDEMENT AND PLACE VAC;  Surgeon: [name], MD;  Location: [facility];  Service: Orthopedics;  Laterality: Right;  . I&D EXTREMITY Right 03/04/2018   Procedure: REPEAT IRRIGATION AND DEBRIDEMENT RIGHT LEG, PLACE WOUND VAC;  Surgeon: [name], MD;  Location: [facility];  Service: Orthopedics;  Laterality: Right;  . I&D EXTREMITY Right 03/30/2018   Procedure: IRRIGATION AND DEBRIDEMENT RIGHT LEG, APPLY WOUND VAC;  Surgeon: [name], MD;  Location: [facility];  Service: Orthopedics;  Laterality: Right;  . I&D EXTREMITY Right 05/27/2018   Procedure: RIGHT LOWER LEG DEBRIDEMENT,  SKIN GRAFT, VAC PLACEMENT;  Surgeon: [name], MD;  Location: [facility];  Service: Orthopedics;  Laterality: Right;  RIGHT LOWER LEG DEBRIDEMENT,  SKIN GRAFT, VAC PLACEMENT  . renal calculi  12/1997   SVT with induction of anesthesia  . right knee arthroscopy    . SKIN SPLIT GRAFT Right 03/04/2018   Procedure: POSSIBLE  SPLIT THICKNESS SKIN GRAFT;  Surgeon: [name], MD;  Location: [facility];  Service: Orthopedics;  Laterality: Right;  . SKIN SPLIT GRAFT Right 04/01/2018   Procedure: REPEAT IRRIGATION AND DEBRIDEMENT RIGHT LEG, APPLY SPLIT THICKNESS SKIN GRAFT;  Surgeon: [name], MD;  Location: [facility];  Service: Orthopedics;  Laterality: Right;  . TONSILLECTOMY AND ADENOIDECTOMY     Social History   Occupational History  . Occupation: Disabled    Employer: RETIRED  Tobacco Use  . Smoking status: Never Smoker  . Smokeless tobacco: Never Used  Substance and Sexual Activity  . Alcohol use: No    Alcohol/week: 0.0 standard drinks  . Drug use: No  . Sexual activity: Not on file

## 2018-07-28 ENCOUNTER — Ambulatory Visit: Payer: Medicare Other

## 2018-08-10 ENCOUNTER — Telehealth: Payer: Self-pay

## 2018-08-10 ENCOUNTER — Other Ambulatory Visit: Payer: Self-pay | Admitting: Nephrology

## 2018-08-10 DIAGNOSIS — N183 Chronic kidney disease, stage 3 unspecified: Secondary | ICD-10-CM

## 2018-08-10 NOTE — Telephone Encounter (Signed)
LVM letting pt know that we have not received SCAT forms and I have received the diabetic shoes form and it was faxed back.

## 2018-08-10 NOTE — Telephone Encounter (Signed)
Copied from [place] [id number]. Topic: General - Other >> Aug 09, 2018  3:12 PM Mcneil, Ja-Kwan wrote: Reason for CRM: Pt would like to know if the form for SCAT transportation was received. Pt stated she would like to be contacted once it is completed so she can have someone pick it up. Pt also asked if the form for her diabetic shoes was received and sent back. Pt requests call back.

## 2018-08-11 ENCOUNTER — Telehealth: Payer: Self-pay | Admitting: Internal Medicine

## 2018-08-11 NOTE — Telephone Encounter (Signed)
General/Other - Paperwork  The patient called and stated that her husband will drop off the paperwork for SCAT that her doctor needs to fill out.

## 2018-08-12 ENCOUNTER — Other Ambulatory Visit: Payer: Self-pay

## 2018-08-12 ENCOUNTER — Encounter: Payer: Self-pay | Admitting: Allergy

## 2018-08-12 ENCOUNTER — Ambulatory Visit (INDEPENDENT_AMBULATORY_CARE_PROVIDER_SITE_OTHER): Payer: Medicare Other | Admitting: Allergy

## 2018-08-12 VITALS — BP 126/78 | HR 84 | Temp 98.2°F | Resp 18

## 2018-08-12 DIAGNOSIS — L508 Other urticaria: Secondary | ICD-10-CM | POA: Diagnosis not present

## 2018-08-12 MED ORDER — MONTELUKAST SODIUM 10 MG PO TABS: 10.0000 mg | ORAL_TABLET | Freq: Every day | ORAL | 5 refills | Status: DC

## 2018-08-12 MED ORDER — EPINEPHRINE 0.3 MG/0.3ML IJ SOAJ: 0.3000 mg | Freq: Once | INTRAMUSCULAR | 1 refills | Status: DC | PRN

## 2018-08-12 NOTE — Progress Notes (Addendum)
New Patient Note  RE: April [name] MRN: [id number] DOB: 30-Jun-1950 Date of Office Visit: 08/12/2018  Referring provider: [name], MD Primary care provider: [name], MD  Chief Complaint: hives and itching  History of present illness: April [name] is a 68 y.o. female presenting today for consultation for hives and itching.   She reports she has been itching and having hives since November 2019.  She has seen her primary care regarding these issues.  She has been treated with prednisone and did have improvement in hives and itch but hives returned once off of prednisone.   She has seen dermatology, Dr. Ronnald Ramp and states she was provided with a cream and hydroxyzine that she takes at night and it sometimes works for her.  She states she primarily takes Benadryl to help with the hives and the itch. She states that the hives appear several times a week and can last about 2 days at a time before resolving.  She states that they do seem to leave a mark behind even if she does not scratch.  However she states that she does scratch quite a bit and breaks the skin and then scars.  She states she does have leg swelling but that this has been a problem before she even started having hives.  She denies any increase in joint aches or pains when she is having hives.  She denies any fevers.  When the hives started she denies starting any new medications or having any changes in the dose of her medications.  She denies any stings.  She denies any change in soaps, lotions, detergents. She does state that she thought the hives are may be related to tomatoes as when she would eat a lot she would get itchier.  She states that she gets itchier with acidic foods like peaches and pickles.  She states she can eat cucumbers without a problem.  She also notes with shellfish that she developed difficulty breathing 1 day when she was at a restaurant eating shrimp.  She states she throws up if she eats a  pork hotdog.  She is able to eat beef hotdogs.  She states she is able to eat other pork products like pork chops without any issue. She does report a bee sting allergy as well. She does report having symptoms related to "hayfever."  She states she would normally take Benadryl to help with the symptoms including sneezing, nasal congestion and drainage.  She has a complex medical history including hypertension, chronic kidney disease, chronic venous hypertension with right lower extremity ulcer, diabetes, hyperlipidemia, GERD. She also has a history of asthma and is on Symbicort and as needed albuterol which is managed by her PCP.  Review of systems: Review of Systems  Constitutional: Negative for chills, fever, malaise/fatigue and weight loss.  HENT: Negative for congestion, ear discharge, nosebleeds and sore throat.   Eyes: Negative for discharge and redness.  Respiratory: Negative for cough, shortness of breath and wheezing.   Cardiovascular: Negative for chest pain.  Gastrointestinal: Negative for abdominal pain, constipation, diarrhea, heartburn, nausea and vomiting.  Musculoskeletal: Positive for joint pain.  Skin: Positive for itching and rash.  Neurological: Negative for headaches.    All other systems negative unless noted above in HPI  Past medical history: Past Medical History:  Diagnosis Date  . Anemia   . Asthma   . CAD (coronary artery disease)   . Carpal tunnel syndrome   . Cellulitis  of both lower extremities 04/11/2015  . CHF (congestive heart failure) ([place])   . Chronic kidney disease    [facility]- followed by [facility]  . Colon polyp, hyperplastic 2007 & 2012  . Complication of anesthesia 1999   svt with renal calculi surgery, no problems since  . CTS (carpal tunnel syndrome)    bilateral  . Diabetes mellitus   . Eczema   . GERD (gastroesophageal reflux disease)   . History of kidney stones 1999  . Hyperlipidemia   . Hypertension   . Leg ulcer ([place])  04/24/2015   Right lateral leg No evidence of an infection Monitor closely Keep edema controlled   . Leg ulcer ([place])    right lower  . Meralgia paresthetica    Dr. [name]  . Morbid obesity ([place])   . Morbid obesity ([place])   . Neuropathy    toes and legs  . Osteoarthrosis, unspecified whether generalized or localized, lower leg    knee  . PUD (peptic ulcer disease)   . Shortness of breath dyspnea    with exertion  . Sleep apnea    per progress note 02/25/2018  . Type II or unspecified type diabetes mellitus without mention of complication, not stated as uncontrolled   . Unspecified hereditary and idiopathic peripheral neuropathy   . Urticaria   . Vitamin B12 deficiency   . Wears glasses   . Wound cellulitis    right upper leg, healing well    Past surgical history: Past Surgical History:  Procedure Laterality Date  . ABDOMINAL HYSTERECTOMY    . CARDIAC CATHETERIZATION  2002   non obstructive disease  . colonoscopy with polypectomy  2007 & 2012    hyperplastic ;Dr Watt Climes  . COLONOSCOPY WITH PROPOFOL N/A 06/04/2015   Procedure: COLONOSCOPY WITH PROPOFOL;  Surgeon: Jerene Bears, MD;  Location: [facility];  Service: Gastroenterology;  Laterality: N/A;  . DEBRIDEMENT LEG Right 03/02/2018   WOUND VAC APPLIED  . DEBRIDEMENT LEG Right 05/27/2018   RIGHT LOWER LEG DEBRIDEMENT,  SKIN GRAFT, VAC PLACEMENT   . DILATION AND CURETTAGE OF UTERUS     multiple  . HEMORRHOID SURGERY    . I&D EXTREMITY Right 03/02/2018   Procedure: RIGHT LEG DEBRIDEMENT AND PLACE VAC;  Surgeon: [name], MD;  Location: [place];  Service: Orthopedics;  Laterality: Right;  . I&D EXTREMITY Right 03/04/2018   Procedure: REPEAT IRRIGATION AND DEBRIDEMENT RIGHT LEG, PLACE WOUND VAC;  Surgeon: [name], MD;  Location: [facility];  Service: Orthopedics;  Laterality: Right;  . I&D EXTREMITY Right 03/30/2018   Procedure: IRRIGATION AND DEBRIDEMENT RIGHT LEG, APPLY WOUND VAC;  Surgeon: [name], MD;  Location: [place];  Service: Orthopedics;  Laterality: Right;  . I&D EXTREMITY Right 05/27/2018   Procedure: RIGHT LOWER LEG DEBRIDEMENT,  SKIN GRAFT, VAC PLACEMENT;  Surgeon: [name], MD;  Location: [facility] [facility];  Service: Orthopedics;  Laterality: Right;  RIGHT LOWER LEG DEBRIDEMENT,  SKIN GRAFT, VAC PLACEMENT  . renal calculi  12/1997   SVT with induction of anesthesia  . right knee arthroscopy    . SKIN SPLIT GRAFT Right 03/04/2018   Procedure: POSSIBLE SPLIT THICKNESS SKIN GRAFT;  Surgeon: [name], MD;  Location: [facility];  Service: Orthopedics;  Laterality: Right;  . SKIN SPLIT GRAFT Right 04/01/2018   Procedure: REPEAT IRRIGATION AND DEBRIDEMENT RIGHT LEG, APPLY SPLIT THICKNESS SKIN GRAFT;  Surgeon: [name], MD;  Location: [place];  Service: Orthopedics;  Laterality: Right;  . TONSILLECTOMY AND ADENOIDECTOMY      Family history:  Family History  Problem Relation Age of Onset  . Colon cancer Mother   . Prostate cancer Father   . Colon cancer Father   . Diabetes Maternal Aunt   . Breast cancer Maternal Aunt   . Diabetes Maternal Uncle   . Diabetes Paternal Aunt   . Stroke Paternal Aunt        > 65  . Heart disease Paternal Aunt   . Diabetes Paternal Uncle   . Breast cancer Maternal Aunt         X 2  . Breast cancer Cousin     Social history: She lives at home with her husband in a house that has carpeting with gas and electric heating and central cooling.  No pets in the home.  No concern for water damage, mildew or roaches in the home.  Denies a smoking history.  She is disabled.  Medication List: Allergies as of 08/12/2018      Reactions   Penicillins Rash, Other (See Comments)   She was told not to take it anymore. Has patient had a PCN reaction causing immediate rash, facial/tongue/throat swelling, SOB or lightheadedness with hypotension: Yes Has patient had a PCN reaction causing severe rash involving mucus membranes or skin necrosis: No Has patient had a PCN reaction that  required hospitalization: No Has patient had a PCN reaction occurring within the last 10 years: No If all of the above answers are "NO", then may proceed with Cephalosporin use.'   Shellfish Allergy Anaphylaxis   Sulfonamide Derivatives Anaphylaxis, Other (See Comments)   REACTION: internal "burning"   Hydrochlorothiazide W-triamterene Other (See Comments)   Hypokalemia   Lotensin [benazepril Hcl] Hives   Dipyridamole Other (See Comments)   Unknown reaction   Estrogens Other (See Comments)   Unknown reaction   Hydrochlorothiazide Other (See Comments)   Unknown reaction   Metronidazole Other (See Comments)   Unknown reaction   Other    Pickles-itching   Spironolactone Other (See Comments)   UNSPECIFIED > "kidney problems"   Sulfa Antibiotics Other (See Comments)   Unknown reaction   Torsemide Other (See Comments)   Unknown reaction   Valsartan Other (See Comments)   Unknown reaction   Mustard [allyl Isothiocyanate] Itching      Medication List       Accurate as of August 12, 2018  4:44 PM. If you have any questions, ask your nurse or doctor.        STOP taking these medications   betamethasone dipropionate 0.05 % cream Commonly known as: DIPROLENE Stopped by: Cleota Pellerito Charmian Muff, MD   polyethylene glycol 17 g packet Commonly known as: MIRALAX / GLYCOLAX Stopped by: Lyvonne Cassell Charmian Muff, MD     TAKE these medications   albuterol 108 (90 Base) MCG/ACT inhaler Commonly known as: Proventil HFA Inhale 2 puffs into the lungs every 4 (four) hours as needed for wheezing or shortness of breath.   albuterol (2.5 MG/3ML) 0.083% nebulizer solution Commonly known as: PROVENTIL Take 3 mLs (2.5 mg total) by nebulization every 6 (six) hours as needed for wheezing or shortness of breath.   budesonide-formoterol 160-4.5 MCG/ACT inhaler Commonly known as: Symbicort one - two inhalations every 12 hours; gargle and spit after use What changed:   how much to take  how  to take this  when to take this  reasons to take this  additional instructions   calcium  carbonate 500 MG chewable tablet Commonly known as: TUMS - dosed in mg elemental calcium Chew 2 tablets by mouth daily as needed for indigestion or heartburn.   carvedilol 25 MG tablet Commonly known as: COREG TAKE 1 TABLET BY MOUTH TWICE DAILY WITH A MEAL   COD LIVER OIL PO Take 1 capsule by mouth daily.   cyanocobalamin 1000 MCG/ML injection Commonly known as: (VITAMIN B-12) INJECT 1ML INTO MUSCLE EVERY 30 DAYS What changed:   how much to take  how to take this  when to take this  additional instructions   diclofenac sodium 1 % Gel Commonly known as: VOLTAREN Apply 2 g topically 4 (four) times daily. What changed:   when to take this  reasons to take this   diphenhydrAMINE 12.5 MG/5ML liquid Commonly known as: BENADRYL Take 25 mg by mouth daily as needed for itching.   docusate sodium 100 MG capsule Commonly known as: COLACE Take 1 capsule (100 mg total) by mouth 2 (two) times daily. What changed:   when to take this  reasons to take this   Ecotrin Low Strength 81 MG EC tablet Generic drug: aspirin Take 81 mg by mouth at bedtime.   EPINEPHrine 0.3 mg/0.3 mL Soaj injection Commonly known as: EPI-PEN Inject 0.3 mLs (0.3 mg total) into the muscle once as needed for anaphylaxis. Started by: Leara Rawl Charmian Muff, MD   famotidine 20 MG tablet Commonly known as: Pepcid Take 1 tablet (20 mg total) by mouth 2 (two) times daily.   feeding supplement (PRO-STAT SUGAR FREE 64) Liqd Take 30 mLs by mouth 3 (three) times daily.   Fish Oil 1000 MG Caps Take 1,000 mg by mouth daily.   FLAX SEEDS PO Take 1 capsule by mouth 2 (two) times a day.   folic acid 474 MCG tablet Commonly known as: FOLVITE Take 400 mcg by mouth at bedtime.   furosemide 40 MG tablet Commonly known as: LASIX Take 3 tablets (120 mg total) by mouth 2 (two) times daily.   hydrOXYzine 25 MG  tablet Commonly known as: ATARAX/VISTARIL Take 10 mg by mouth every 6 (six) hours as needed for itching.   montelukast 10 MG tablet Commonly known as: SINGULAIR Take 1 tablet (10 mg total) by mouth at bedtime. Started by: Sabre Romberger Charmian Muff, MD   multivitamin with minerals Tabs tablet Take 1 tablet by mouth daily.   NEEDLE (DISP) 23 G 23G X 1-1/2" Misc Commonly known as: Easy Touch FlipLock Needles Use to inject B12 once monthly.   onetouch ultrasoft lancets Use to help check blood sugars twice a day Dx E11.9   OXcarbazepine 150 MG tablet Commonly known as: TRILEPTAL Take 1 tablet (150 mg total) by mouth 2 (two) times daily.   oxyCODONE-acetaminophen 5-325 MG tablet Commonly known as: PERCOCET/ROXICET Take 1 tablet by mouth every 6 (six) hours as needed for severe pain.   potassium chloride SA 20 MEQ tablet Commonly known as: K-DUR TAKE 3 TABLETS BY MOUTH DAILY.   silver sulfADIAZINE 1 % cream Commonly known as: Silvadene Apply 1 application topically daily.   simvastatin 40 MG tablet Commonly known as: ZOCOR Take 1 tablet (40 mg total) by mouth at bedtime.   spironolactone 25 MG tablet Commonly known as: ALDACTONE TAKE 1 TABLET BY MOUTH ONCE DAILY   SYRINGE-NEEDLE (DISP) 3 ML 23G X 1" 3 ML Misc Use with B12 to inject IM every 30 days   vancomycin 10 G Solr injection Commonly known as: [employer]  Known medication allergies: Allergies  Allergen Reactions  . Penicillins Rash and Other (See Comments)    She was told not to take it anymore. Has patient had a PCN reaction causing immediate rash, facial/tongue/throat swelling, SOB or lightheadedness with hypotension: Yes Has patient had a PCN reaction causing severe rash involving mucus membranes or skin necrosis: No Has patient had a PCN reaction that required hospitalization: No Has patient had a PCN reaction occurring within the last 10 years: No If all of the above answers are "NO", then may  proceed with Cephalosporin use.'  . Shellfish Allergy Anaphylaxis  . Sulfonamide Derivatives Anaphylaxis and Other (See Comments)    REACTION: internal "burning"  . Hydrochlorothiazide W-Triamterene Other (See Comments)    Hypokalemia  . Lotensin [Benazepril Hcl] Hives  . Dipyridamole Other (See Comments)    Unknown reaction  . Estrogens Other (See Comments)    Unknown reaction  . Hydrochlorothiazide Other (See Comments)    Unknown reaction  . Metronidazole Other (See Comments)    Unknown reaction  . Other     Pickles-itching  . Spironolactone Other (See Comments)    UNSPECIFIED > "kidney problems"  . Sulfa Antibiotics Other (See Comments)    Unknown reaction  . Torsemide Other (See Comments)    Unknown reaction  . Valsartan Other (See Comments)    Unknown reaction  . Madelaine Bhat Isothiocyanate] Itching     Physical examination: Blood pressure 126/78, pulse 84, temperature 98.2 F (36.8 C), temperature source Temporal, resp. rate 18, SpO2 93 %.  General: Alert, interactive, in no acute distress. HEENT: EOMI, turbinates non-edematous. Neck: Supple without lymphadenopathy. Lungs: Clear to auscultation without wheezing, rhonchi or rales. {no increased work of breathing. CV: Normal S1, S2 without murmurs. Abdomen: Nondistended, nontender. Skin: Several urticarial lesions on her neck.  Upper chest and arms with numerous hyperpigmented macules with excoriation. Extremities:  No clubbing, cyanosis or edema. Neuro:   Grossly intact.  Diagnositics/Labs: Labs: Component     Latest Ref Rng & Units 07/15/2018  WBC     4.0 - 10.5 K/uL 4.5  RBC     3.87 - 5.11 Mil/uL 4.10  Hemoglobin     12.0 - 15.0 g/dL 11.5 (L)  HCT     36.0 - 46.0 % 36.2  MCV     78.0 - 100.0 fl 88.4  MCHC     30.0 - 36.0 g/dL 31.7  RDW     11.5 - 15.5 % 18.1 (H)  Platelets     150.0 - 400.0 K/uL 183.0  Neutrophils     43.0 - 77.0 % 49.0  Lymphocytes     12.0 - 46.0 % 23.1  Monocytes Relative      3.0 - 12.0 % 11.2  Eosinophil     0.0 - 5.0 % 14.3 (H)  Basophil     0.0 - 3.0 % 2.4  NEUT#     1.4 - 7.7 K/uL 2.2  Lymphocyte #     0.7 - 4.0 K/uL 1.0  Monocyte #     0.1 - 1.0 K/uL 0.5  Eosinophils Absolute     0.0 - 0.7 K/uL 0.6  Basophils Absolute     0.0 - 0.1 K/uL 0.1  Sodium     135 - 145 mEq/L 138  Potassium     3.5 - 5.1 mEq/L 4.6  Chloride     96 - 112 mEq/L 99  CO2     19 - 32 mEq/L 32  Glucose  70 - 99 mg/dL 116 (H)  BUN     6 - 23 mg/dL 23  Creatinine     0.40 - 1.20 mg/dL 2.17 (H)  Total Bilirubin     0.2 - 1.2 mg/dL 0.5  Alkaline Phosphatase     39 - 117 U/L 85  AST     0 - 37 U/L 18  ALT     0 - 35 U/L 13  Total Protein     6.0 - 8.3 g/dL 6.8  Albumin     3.5 - 5.2 g/dL 3.7  Calcium     8.4 - 10.5 mg/dL 9.6  GFR     >60.00 mL/min 27.28 (L)     Assessment and plan: Chronic urticaria  - at this time etiology of hives and swelling is unknown.  Hives can be caused by a variety of different triggers including illness/infection, foods, medications, stings, exercise, pressure, vibrations, extremes of temperature to name a few however majority of the time there is no identifiable trigger.  Your symptoms have been ongoing for >6 weeks making this chronic thus will obtain labwork to evaluate: tryptase, hive panel, environmental panel, alpha-gal panel, inflammatory markers, food panel (including shellfish, tomatoes).  CBC and CMP reviewed as above.  - for management of hives recommend taking following high dose antihistamine regimen: Claritin 10mg  1 tablet twice a day, Famotidine 20mg  1 tablet twice a day and Singulair 10mg  1 tablet at bedtime.   Decision to use Claritin due to her chronic kidney disease as Claritin is excreted through the liver.  - we dicussed briefly Xolair monthly injections if high-dose antihistamine regimen is not effective.  Will discuss this further if needed at follow-up visit  Food allergy and stinging insect allergy   -Advised that she should have access to an epinephrine device.  She states she tried to get an EpiPen within the past year and it was too expensive for her to obtain.  -She will continue to avoid shellfish, pickles as well as pork hotdogs.  She will also do her best to avoid stinging insects.   Follow-up in 2 months or sooner if needed  I appreciate the opportunity to take part in April [name]'s care. Please do not hesitate to contact me with questions.  Sincerely,   Prudy Feeler, MD Allergy/Immunology [facility]

## 2018-08-12 NOTE — Patient Instructions (Signed)
Hives and itch  - at this time etiology of hives and swelling is unknown.  Hives can be caused by a variety of different triggers including illness/infection, foods, medications, stings, exercise, pressure, vibrations, extremes of temperature to name a few however majority of the time there is no identifiable trigger.  Your symptoms have been ongoing for >6 weeks making this chronic thus will obtain labwork to evaluate: CBC w diff, CMP, tryptase, hive panel, environmental panel, alpha-gal panel, inflammatory markers, food panel (including shellfish, tomatoes)  - for management of hives recommend taking following high dose antihistamine regimen: Claritin 10mg  1 tablet twice a day, Famotidine 20mg  1 tablet twice a day and Singulair 10mg  1 tablet at bedtime  - we dicussed briefly Xolair monthly injections if high-dose antihistamine regimen is not effective.  Will discuss this further if needed at follow-up visit   Follow-up in 2 months or sooner if needed

## 2018-08-13 ENCOUNTER — Other Ambulatory Visit: Payer: Self-pay | Admitting: Internal Medicine

## 2018-08-15 ENCOUNTER — Emergency Department (HOSPITAL_COMMUNITY): Payer: Medicare Other

## 2018-08-15 ENCOUNTER — Emergency Department (HOSPITAL_BASED_OUTPATIENT_CLINIC_OR_DEPARTMENT_OTHER): Payer: Medicare Other

## 2018-08-15 ENCOUNTER — Encounter (HOSPITAL_COMMUNITY): Payer: Self-pay

## 2018-08-15 ENCOUNTER — Telehealth: Payer: Self-pay | Admitting: Internal Medicine

## 2018-08-15 ENCOUNTER — Other Ambulatory Visit: Payer: Self-pay

## 2018-08-15 ENCOUNTER — Inpatient Hospital Stay (HOSPITAL_COMMUNITY)
Admission: EM | Admit: 2018-08-15 | Discharge: 2018-08-23 | DRG: 300 | Disposition: A | Discharge status: Home Health | Payer: Medicare Other | Attending: Internal Medicine | Admitting: Internal Medicine

## 2018-08-15 DIAGNOSIS — I87331 Chronic venous hypertension (idiopathic) with ulcer and inflammation of right lower extremity: Secondary | ICD-10-CM | POA: Diagnosis present

## 2018-08-15 DIAGNOSIS — L97919 Non-pressure chronic ulcer of unspecified part of right lower leg with unspecified severity: Secondary | ICD-10-CM | POA: Diagnosis present

## 2018-08-15 DIAGNOSIS — K219 Gastro-esophageal reflux disease without esophagitis: Secondary | ICD-10-CM | POA: Diagnosis present

## 2018-08-15 DIAGNOSIS — N189 Chronic kidney disease, unspecified: Secondary | ICD-10-CM

## 2018-08-15 DIAGNOSIS — Z7982 Long term (current) use of aspirin: Secondary | ICD-10-CM

## 2018-08-15 DIAGNOSIS — E1142 Type 2 diabetes mellitus with diabetic polyneuropathy: Secondary | ICD-10-CM | POA: Diagnosis not present

## 2018-08-15 DIAGNOSIS — I2510 Atherosclerotic heart disease of native coronary artery without angina pectoris: Secondary | ICD-10-CM | POA: Diagnosis present

## 2018-08-15 DIAGNOSIS — R52 Pain, unspecified: Secondary | ICD-10-CM | POA: Diagnosis present

## 2018-08-15 DIAGNOSIS — Z880 Allergy status to penicillin: Secondary | ICD-10-CM

## 2018-08-15 DIAGNOSIS — R609 Edema, unspecified: Secondary | ICD-10-CM | POA: Diagnosis not present

## 2018-08-15 DIAGNOSIS — N179 Acute kidney failure, unspecified: Secondary | ICD-10-CM | POA: Diagnosis not present

## 2018-08-15 DIAGNOSIS — Z79899 Other long term (current) drug therapy: Secondary | ICD-10-CM

## 2018-08-15 DIAGNOSIS — Z6841 Body Mass Index (BMI) 40.0 and over, adult: Secondary | ICD-10-CM

## 2018-08-15 DIAGNOSIS — N183 Chronic kidney disease, stage 3 unspecified: Secondary | ICD-10-CM | POA: Diagnosis present

## 2018-08-15 DIAGNOSIS — Z20828 Contact with and (suspected) exposure to other viral communicable diseases: Secondary | ICD-10-CM | POA: Diagnosis present

## 2018-08-15 DIAGNOSIS — I10 Essential (primary) hypertension: Secondary | ICD-10-CM

## 2018-08-15 DIAGNOSIS — Z833 Family history of diabetes mellitus: Secondary | ICD-10-CM

## 2018-08-15 DIAGNOSIS — Z7951 Long term (current) use of inhaled steroids: Secondary | ICD-10-CM

## 2018-08-15 DIAGNOSIS — Z8614 Personal history of Methicillin resistant Staphylococcus aureus infection: Secondary | ICD-10-CM

## 2018-08-15 DIAGNOSIS — Z8249 Family history of ischemic heart disease and other diseases of the circulatory system: Secondary | ICD-10-CM

## 2018-08-15 DIAGNOSIS — E785 Hyperlipidemia, unspecified: Secondary | ICD-10-CM | POA: Diagnosis present

## 2018-08-15 DIAGNOSIS — E1151 Type 2 diabetes mellitus with diabetic peripheral angiopathy without gangrene: Principal | ICD-10-CM | POA: Diagnosis present

## 2018-08-15 DIAGNOSIS — E1122 Type 2 diabetes mellitus with diabetic chronic kidney disease: Secondary | ICD-10-CM | POA: Diagnosis present

## 2018-08-15 DIAGNOSIS — R600 Localized edema: Secondary | ICD-10-CM | POA: Diagnosis not present

## 2018-08-15 DIAGNOSIS — I5032 Chronic diastolic (congestive) heart failure: Secondary | ICD-10-CM | POA: Diagnosis present

## 2018-08-15 DIAGNOSIS — M7989 Other specified soft tissue disorders: Secondary | ICD-10-CM

## 2018-08-15 DIAGNOSIS — G4733 Obstructive sleep apnea (adult) (pediatric): Secondary | ICD-10-CM | POA: Diagnosis present

## 2018-08-15 DIAGNOSIS — I130 Hypertensive heart and chronic kidney disease with heart failure and stage 1 through stage 4 chronic kidney disease, or unspecified chronic kidney disease: Secondary | ICD-10-CM | POA: Diagnosis present

## 2018-08-15 DIAGNOSIS — G609 Hereditary and idiopathic neuropathy, unspecified: Secondary | ICD-10-CM | POA: Diagnosis present

## 2018-08-15 DIAGNOSIS — Z882 Allergy status to sulfonamides status: Secondary | ICD-10-CM

## 2018-08-15 DIAGNOSIS — I5033 Acute on chronic diastolic (congestive) heart failure: Secondary | ICD-10-CM | POA: Diagnosis present

## 2018-08-15 DIAGNOSIS — Z888 Allergy status to other drugs, medicaments and biological substances status: Secondary | ICD-10-CM

## 2018-08-15 DIAGNOSIS — E1149 Type 2 diabetes mellitus with other diabetic neurological complication: Secondary | ICD-10-CM | POA: Diagnosis present

## 2018-08-15 DIAGNOSIS — Z91013 Allergy to seafood: Secondary | ICD-10-CM

## 2018-08-15 DIAGNOSIS — M25572 Pain in left ankle and joints of left foot: Secondary | ICD-10-CM

## 2018-08-15 DIAGNOSIS — M109 Gout, unspecified: Secondary | ICD-10-CM | POA: Diagnosis present

## 2018-08-15 DIAGNOSIS — J449 Chronic obstructive pulmonary disease, unspecified: Secondary | ICD-10-CM | POA: Diagnosis present

## 2018-08-15 DIAGNOSIS — Z713 Dietary counseling and surveillance: Secondary | ICD-10-CM

## 2018-08-15 LAB — COMPREHENSIVE METABOLIC PANEL
ALT: 15 U/L (ref 0–44)
AST: 20 U/L (ref 15–41)
Albumin: 3.3 g/dL — ABNORMAL LOW (ref 3.5–5.0)
Alkaline Phosphatase: 74 U/L (ref 38–126)
Anion gap: 10 (ref 5–15)
BUN: 32 mg/dL — ABNORMAL HIGH (ref 8–23)
CO2: 30 mmol/L (ref 22–32)
Calcium: 9.1 mg/dL (ref 8.9–10.3)
Chloride: 100 mmol/L (ref 98–111)
Creatinine, Ser: 2.45 mg/dL — ABNORMAL HIGH (ref 0.44–1.00)
GFR calc Af Amer: 23 mL/min — ABNORMAL LOW (ref >60)
GFR calc non Af Amer: 20 mL/min — ABNORMAL LOW (ref >60)
Glucose, Bld: 171 mg/dL — ABNORMAL HIGH (ref 70–99)
Potassium: 4.5 mmol/L (ref 3.5–5.1)
Sodium: 140 mmol/L (ref 135–145)
Total Bilirubin: 0.6 mg/dL (ref 0.3–1.2)
Total Protein: 6.5 g/dL (ref 6.5–8.1)

## 2018-08-15 LAB — CBC WITH DIFFERENTIAL/PLATELET
Abs Immature Granulocytes: 0.03 10*3/uL (ref 0.00–0.07)
Basophils Absolute: 0.1 10*3/uL (ref 0.0–0.1)
Basophils Relative: 2 %
Eosinophils Absolute: 0.4 10*3/uL (ref 0.0–0.5)
Eosinophils Relative: 8 %
HCT: 34 % — ABNORMAL LOW (ref 36.0–46.0)
Hemoglobin: 10.4 g/dL — ABNORMAL LOW (ref 12.0–15.0)
Immature Granulocytes: 1 %
Lymphocytes Relative: 20 %
Lymphs Abs: 1.1 10*3/uL (ref 0.7–4.0)
MCH: 28.7 pg (ref 26.0–34.0)
MCHC: 30.6 g/dL (ref 30.0–36.0)
MCV: 93.9 fL (ref 80.0–100.0)
Monocytes Absolute: 0.5 10*3/uL (ref 0.1–1.0)
Monocytes Relative: 10 %
Neutro Abs: 3.1 10*3/uL (ref 1.7–7.7)
Neutrophils Relative %: 59 %
Platelets: 172 10*3/uL (ref 150–400)
RBC: 3.62 MIL/uL — ABNORMAL LOW (ref 3.87–5.11)
RDW: 16.6 % — ABNORMAL HIGH (ref 11.5–15.5)
WBC: 5.2 10*3/uL (ref 4.0–10.5)
nRBC: 0.4 % — ABNORMAL HIGH (ref 0.0–0.2)

## 2018-08-15 LAB — GLUCOSE, CAPILLARY
Glucose-Capillary: 75 mg/dL (ref 70–99)
Glucose-Capillary: 154 mg/dL — ABNORMAL HIGH (ref 70–99)

## 2018-08-15 LAB — LACTIC ACID, PLASMA
Lactic Acid, Venous: 1 mmol/L (ref 0.5–1.9)
Lactic Acid, Venous: 1.5 mmol/L (ref 0.5–1.9)

## 2018-08-15 LAB — BRAIN NATRIURETIC PEPTIDE: B Natriuretic Peptide: 23.6 pg/mL (ref 0.0–100.0)

## 2018-08-15 LAB — SARS CORONAVIRUS 2 BY RT PCR (HOSPITAL ORDER, PERFORMED IN ~~LOC~~ HOSPITAL LAB): SARS Coronavirus 2: NEGATIVE

## 2018-08-15 LAB — I-STAT CREATININE, ED: Creatinine, Ser: 2.4 mg/dL — ABNORMAL HIGH (ref 0.44–1.00)

## 2018-08-15 MED ORDER — ACETAMINOPHEN 325 MG PO TABS
650.0000 mg | ORAL_TABLET | Freq: Four times a day (QID) | ORAL | Status: DC | PRN
  Administered 2018-08-17 – 2018-08-18 (×2): 650 mg via ORAL
  Filled 2018-08-15 (×2): qty 2

## 2018-08-15 MED ORDER — SODIUM CHLORIDE 0.9% FLUSH
3.0000 mL | Freq: Two times a day (BID) | INTRAVENOUS | Status: DC
  Administered 2018-08-16 – 2018-08-23 (×13): 3 mL via INTRAVENOUS

## 2018-08-15 MED ORDER — SODIUM CHLORIDE 0.9% FLUSH: 3.0000 mL | INTRAVENOUS | Status: DC | PRN

## 2018-08-15 MED ORDER — ASPIRIN 81 MG PO TBEC: 81.0000 mg | DELAYED_RELEASE_TABLET | Freq: Every day | ORAL | Status: DC

## 2018-08-15 MED ORDER — ASPIRIN EC 81 MG PO TBEC
81.0000 mg | DELAYED_RELEASE_TABLET | Freq: Every day | ORAL | Status: DC
  Administered 2018-08-15 – 2018-08-22 (×8): 81 mg via ORAL
  Filled 2018-08-15 (×8): qty 1

## 2018-08-15 MED ORDER — MOMETASONE FURO-FORMOTEROL FUM 200-5 MCG/ACT IN AERO
2.0000 | INHALATION_SPRAY | Freq: Two times a day (BID) | RESPIRATORY_TRACT | Status: DC
  Administered 2018-08-16 – 2018-08-23 (×15): 2 via RESPIRATORY_TRACT
  Filled 2018-08-15 (×2): qty 8.8

## 2018-08-15 MED ORDER — ALBUTEROL SULFATE (2.5 MG/3ML) 0.083% IN NEBU: 3.0000 mL | INHALATION_SOLUTION | RESPIRATORY_TRACT | Status: DC | PRN

## 2018-08-15 MED ORDER — HEPARIN SODIUM (PORCINE) 5000 UNIT/ML IJ SOLN
5000.0000 [IU] | Freq: Three times a day (TID) | INTRAMUSCULAR | Status: DC
  Administered 2018-08-15 – 2018-08-23 (×24): 5000 [IU] via SUBCUTANEOUS
  Filled 2018-08-15 (×24): qty 1

## 2018-08-15 MED ORDER — SODIUM CHLORIDE 0.9 % IV SOLN
250.0000 mL | INTRAVENOUS | Status: DC | PRN
  Administered 2018-08-15: 250 mL via INTRAVENOUS

## 2018-08-15 MED ORDER — VANCOMYCIN HCL 10 G IV SOLR: 1750.0000 mg | INTRAVENOUS | Status: DC

## 2018-08-15 MED ORDER — ACETAMINOPHEN 650 MG RE SUPP: 650.0000 mg | Freq: Four times a day (QID) | RECTAL | Status: DC | PRN

## 2018-08-15 MED ORDER — DICLOFENAC SODIUM 1 % TD GEL
2.0000 g | Freq: Four times a day (QID) | TRANSDERMAL | Status: DC | PRN
  Administered 2018-08-16 – 2018-08-23 (×5): 2 g via TOPICAL
  Filled 2018-08-15 (×2): qty 100

## 2018-08-15 MED ORDER — ALBUTEROL SULFATE (2.5 MG/3ML) 0.083% IN NEBU: 2.5000 mg | INHALATION_SOLUTION | Freq: Four times a day (QID) | RESPIRATORY_TRACT | Status: DC | PRN

## 2018-08-15 MED ORDER — SIMVASTATIN 20 MG PO TABS
40.0000 mg | ORAL_TABLET | Freq: Every day | ORAL | Status: DC
  Administered 2018-08-15 – 2018-08-22 (×8): 40 mg via ORAL
  Filled 2018-08-15 (×8): qty 2

## 2018-08-15 MED ORDER — PERMETHRIN 5 % EX CREA
1.0000 "application " | TOPICAL_CREAM | CUTANEOUS | Status: DC | PRN
  Filled 2018-08-15: qty 60

## 2018-08-15 MED ORDER — HYDROXYZINE HCL 10 MG PO TABS
10.0000 mg | ORAL_TABLET | Freq: Four times a day (QID) | ORAL | Status: DC | PRN
  Administered 2018-08-16 – 2018-08-21 (×4): 10 mg via ORAL
  Filled 2018-08-15 (×7): qty 1

## 2018-08-15 MED ORDER — OXYCODONE HCL 5 MG PO TABS: 2.5000 mg | ORAL_TABLET | Freq: Once | ORAL | Status: DC

## 2018-08-15 MED ORDER — SPIRONOLACTONE 25 MG PO TABS
25.0000 mg | ORAL_TABLET | Freq: Every day | ORAL | Status: DC
  Administered 2018-08-17 – 2018-08-23 (×7): 25 mg via ORAL
  Filled 2018-08-15 (×8): qty 1

## 2018-08-15 MED ORDER — OXYCODONE-ACETAMINOPHEN 5-325 MG PO TABS
1.0000 | ORAL_TABLET | Freq: Four times a day (QID) | ORAL | Status: DC | PRN
  Administered 2018-08-15 – 2018-08-23 (×24): 1 via ORAL
  Filled 2018-08-15 (×24): qty 1

## 2018-08-15 MED ORDER — VANCOMYCIN HCL 10 G IV SOLR
2500.0000 mg | Freq: Once | INTRAVENOUS | Status: DC
  Administered 2018-08-15: 19:00:00 2500 mg via INTRAVENOUS
  Filled 2018-08-15: qty 2500

## 2018-08-15 MED ORDER — INSULIN ASPART 100 UNIT/ML ~~LOC~~ SOLN
0.0000 [IU] | Freq: Three times a day (TID) | SUBCUTANEOUS | Status: DC
  Administered 2018-08-16: 2 [IU] via SUBCUTANEOUS
  Administered 2018-08-17 (×2): 1 [IU] via SUBCUTANEOUS
  Administered 2018-08-18: 2 [IU] via SUBCUTANEOUS
  Administered 2018-08-18: 12:00:00 1 [IU] via SUBCUTANEOUS
  Administered 2018-08-19: 2 [IU] via SUBCUTANEOUS
  Administered 2018-08-20: 1 [IU] via SUBCUTANEOUS
  Administered 2018-08-21 (×2): 3 [IU] via SUBCUTANEOUS
  Administered 2018-08-22: 1 [IU] via SUBCUTANEOUS
  Administered 2018-08-22: 5 [IU] via SUBCUTANEOUS
  Administered 2018-08-22 – 2018-08-23 (×2): 2 [IU] via SUBCUTANEOUS
  Administered 2018-08-23: 1 [IU] via SUBCUTANEOUS

## 2018-08-15 MED ORDER — OXCARBAZEPINE 150 MG PO TABS
150.0000 mg | ORAL_TABLET | Freq: Two times a day (BID) | ORAL | Status: DC
  Administered 2018-08-15 – 2018-08-17 (×4): 150 mg via ORAL
  Filled 2018-08-15 (×5): qty 1

## 2018-08-15 MED ORDER — MONTELUKAST SODIUM 10 MG PO TABS
10.0000 mg | ORAL_TABLET | Freq: Every day | ORAL | Status: DC
  Administered 2018-08-15 – 2018-08-22 (×8): 10 mg via ORAL
  Filled 2018-08-15 (×8): qty 1

## 2018-08-15 MED ORDER — FAMOTIDINE 20 MG PO TABS
20.0000 mg | ORAL_TABLET | Freq: Two times a day (BID) | ORAL | Status: DC
  Administered 2018-08-15 – 2018-08-23 (×16): 20 mg via ORAL
  Filled 2018-08-15 (×17): qty 1

## 2018-08-15 MED ORDER — CARVEDILOL 25 MG PO TABS
25.0000 mg | ORAL_TABLET | Freq: Every day | ORAL | Status: DC
  Administered 2018-08-16 – 2018-08-23 (×8): 25 mg via ORAL
  Filled 2018-08-15 (×8): qty 1

## 2018-08-15 MED ORDER — FUROSEMIDE 40 MG PO TABS
80.0000 mg | ORAL_TABLET | Freq: Two times a day (BID) | ORAL | Status: DC
  Administered 2018-08-15 – 2018-08-23 (×17): 80 mg via ORAL
  Filled 2018-08-15 (×17): qty 2

## 2018-08-15 NOTE — Telephone Encounter (Signed)
Patient called [facility] 08/13/18 at 2:20pm stating that her feet and legs were swollen and painful.  State her pain was a 10 out of a 10.  States she was taking lasix.  Patient was advised to go to the ED. I called patient this morning.  Spouse states that patient was in the process of being taken to the ED as we spoke by ambulance.

## 2018-08-15 NOTE — Progress Notes (Signed)
Lower extremity venous has been completed.   Preliminary results in CV Proc.   Abram Sander 08/15/2018 2:31 PM

## 2018-08-15 NOTE — ED Triage Notes (Addendum)
Pt bib [facility] due to concern of increasing bilateral ankle swelling and pain since the weekend. Patient does have a history of CHF and chronic wounds. No other complaints, NAD.

## 2018-08-15 NOTE — Telephone Encounter (Signed)
noted 

## 2018-08-15 NOTE — Progress Notes (Signed)
Pharmacy Antibiotic Note  April [name] is a 68 y.o. female admitted on 08/15/2018 with cellulitis.  Pharmacy has been consulted for vancomycin dosing. SCr 2.4 w/ CrCl 34 (basline SCr ~1.4). Tmax 98.8 and WBC 5.2  Plan: Vancomycin 2.5g IV x1 then vancomycin 1.75g IV q48h Monitor renal function and clinical progression F/u C&S  Goal AUC 400-550. Expected AUC: 539.8 SCr used: 2.4   Height: 5\' 4"  (162.6 cm) Weight: (!) 349 lb (158.3 kg) IBW/kg (Calculated) : 54.7  Temp (24hrs), Avg:98.8 F (37.1 C), Min:98.8 F (37.1 C), Max:98.8 F (37.1 C)  Recent Labs  Lab 08/15/18 1019 08/15/18 1051 08/15/18 1124  WBC 5.2  --   --   CREATININE 2.45* 2.40*  --   LATICACIDVEN  --   --  1.0    Estimated Creatinine Clearance: 34 mL/min (A) (by C-G formula based on SCr of 2.4 mg/dL (H)).    Allergies  Allergen Reactions  . Penicillins Rash and Other (See Comments)    She was told not to take it anymore. Has patient had a PCN reaction causing immediate rash, facial/tongue/throat swelling, SOB or lightheadedness with hypotension: Yes Has patient had a PCN reaction causing severe rash involving mucus membranes or skin necrosis: No Has patient had a PCN reaction that required hospitalization: No Has patient had a PCN reaction occurring within the last 10 years: No If all of the above answers are "NO", then may proceed with Cephalosporin use.'  . Shellfish Allergy Anaphylaxis  . Sulfonamide Derivatives Anaphylaxis and Other (See Comments)    REACTION: internal "burning"  . Hydrochlorothiazide W-Triamterene Other (See Comments)    Hypokalemia  . Lotensin [Benazepril Hcl] Hives  . Dipyridamole Other (See Comments)    Unknown reaction  . Estrogens Other (See Comments)    Unknown reaction  . Hydrochlorothiazide Other (See Comments)    Unknown reaction  . Metronidazole Other (See Comments)    Unknown reaction  . Other     Pickles-itching  . Spironolactone Other (See Comments)   UNSPECIFIED > "kidney problems"  . Sulfa Antibiotics Other (See Comments)    Unknown reaction  . Torsemide Other (See Comments)    Unknown reaction  . Valsartan Other (See Comments)    Unknown reaction  . Madelaine Bhat Isothiocyanate] Itching    Antimicrobials this admission: Vancomycin 7/13 >>   Dose adjustments this admission: n/a  Microbiology results: Pending  Thank you for allowing pharmacy to be a part of this patient's care.  Natale Lay 08/15/2018 3:32 PM

## 2018-08-15 NOTE — ED Notes (Signed)
ED TO INPATIENT HANDOFF REPORT  ED Nurse Name and Phone #: [phone] Gen Clagg  S Name/Age/Gender April [name] 68 y.o. female Room/Bed: 028C/028C  Code Status   Code Status: Prior  Home/SNF/Other Home Patient oriented to: self, place, time and situation Is this baseline? Yes   Triage Complete: Triage complete  Chief Complaint Ankle Swelling  Triage Note Pt bib [facility] due to concern of increasing bilateral ankle swelling and pain since the weekend. Patient does have a history of CHF and chronic wounds. No other complaints, NAD.   Allergies Allergies  Allergen Reactions  . Penicillins Rash and Other (See Comments)    She was told not to take it anymore. Has patient had a PCN reaction causing immediate rash, facial/tongue/throat swelling, SOB or lightheadedness with hypotension: Yes Has patient had a PCN reaction causing severe rash involving mucus membranes or skin necrosis: No Has patient had a PCN reaction that required hospitalization: No Has patient had a PCN reaction occurring within the last 10 years: No If all of the above answers are "NO", then may proceed with Cephalosporin use.'  . Shellfish Allergy Anaphylaxis  . Sulfonamide Derivatives Anaphylaxis and Other (See Comments)    REACTION: internal "burning"  . Hydrochlorothiazide W-Triamterene Other (See Comments)    Hypokalemia  . Lotensin [Benazepril Hcl] Hives  . Dipyridamole Other (See Comments)    Unknown reaction  . Estrogens Other (See Comments)    Unknown reaction  . Hydrochlorothiazide Other (See Comments)    Unknown reaction  . Metronidazole Other (See Comments)    Unknown reaction  . Other     Pickles-itching  . Spironolactone Other (See Comments)    UNSPECIFIED > "kidney problems"  . Sulfa Antibiotics Other (See Comments)    Unknown reaction  . Torsemide Other (See Comments)    Unknown reaction  . Valsartan Other (See Comments)    Unknown reaction  . Madelaine Bhat Isothiocyanate] Itching     Level of Care/Admitting Diagnosis ED Disposition    ED Disposition Condition [facility] Area: [place] [[id number]]  Level of Care: Med-Surg [16]  I expect the patient will be discharged within 24 hours: Yes  LOW acuity---Tx typically complete <24 hrs---ACUTE conditions typically can be evaluated <24 hours---LABS likely to return to acceptable levels <24 hours---IS near functional baseline---EXPECTED to return to current living arrangement---NOT newly hypoxic: Meets criteria for 5C-Observation unit  Covid Evaluation: Asymptomatic Screening Protocol (No Symptoms)  Diagnosis: Bilateral leg edema [784696]  Admitting Physician: Vashti Hey [[id number]]  Attending Physician: Vashti Hey [[id number]]  PT Class (Do Not Modify): Observation [104]  PT Acc Code (Do Not Modify): Observation [10022]       B Medical/Surgery History Past Medical History:  Diagnosis Date  . Anemia   . Asthma   . CAD (coronary artery disease)   . Carpal tunnel syndrome   . Cellulitis of both lower extremities 04/11/2015  . CHF (congestive heart failure) ([place])   . Chronic kidney disease    [facility]- followed by [facility]  . Colon polyp, hyperplastic 2007 & 2012  . Complication of anesthesia 1999   svt with renal calculi surgery, no problems since  . CTS (carpal tunnel syndrome)    bilateral  . Diabetes mellitus   . Eczema   . GERD (gastroesophageal reflux disease)   . History of kidney stones 1999  . Hyperlipidemia   . Hypertension   . Leg ulcer ([place]) 04/24/2015   Right lateral leg  No evidence of an infection Monitor closely Keep edema controlled   . Leg ulcer ([place])    right lower  . Meralgia paresthetica    Dr. [name]  . Morbid obesity ([place])   . Morbid obesity ([place])   . Neuropathy    toes and legs  . Osteoarthrosis, unspecified whether generalized or localized, lower leg    knee  . PUD (peptic ulcer disease)   . Shortness of breath dyspnea     with exertion  . Sleep apnea    per progress note 02/25/2018  . Type II or unspecified type diabetes mellitus without mention of complication, not stated as uncontrolled   . Unspecified hereditary and idiopathic peripheral neuropathy   . Urticaria   . Vitamin B12 deficiency   . Wears glasses   . Wound cellulitis    right upper leg, healing well   Past Surgical History:  Procedure Laterality Date  . ABDOMINAL HYSTERECTOMY    . CARDIAC CATHETERIZATION  2002   non obstructive disease  . colonoscopy with polypectomy  2007 & 2012    hyperplastic ;Dr Watt Climes  . COLONOSCOPY WITH PROPOFOL N/A 06/04/2015   Procedure: COLONOSCOPY WITH PROPOFOL;  Surgeon: Jerene Bears, MD;  Location: [facility];  Service: Gastroenterology;  Laterality: N/A;  . DEBRIDEMENT LEG Right 03/02/2018   WOUND VAC APPLIED  . DEBRIDEMENT LEG Right 05/27/2018   RIGHT LOWER LEG DEBRIDEMENT,  SKIN GRAFT, VAC PLACEMENT   . DILATION AND CURETTAGE OF UTERUS     multiple  . HEMORRHOID SURGERY    . I&D EXTREMITY Right 03/02/2018   Procedure: RIGHT LEG DEBRIDEMENT AND PLACE VAC;  Surgeon: [name], MD;  Location: [facility];  Service: Orthopedics;  Laterality: Right;  . I&D EXTREMITY Right 03/04/2018   Procedure: REPEAT IRRIGATION AND DEBRIDEMENT RIGHT LEG, PLACE WOUND VAC;  Surgeon: [name], MD;  Location: [facility];  Service: Orthopedics;  Laterality: Right;  . I&D EXTREMITY Right 03/30/2018   Procedure: IRRIGATION AND DEBRIDEMENT RIGHT LEG, APPLY WOUND VAC;  Surgeon: [name], MD;  Location: [facility];  Service: Orthopedics;  Laterality: Right;  . I&D EXTREMITY Right 05/27/2018   Procedure: RIGHT LOWER LEG DEBRIDEMENT,  SKIN GRAFT, VAC PLACEMENT;  Surgeon: [name], MD;  Location: [facility];  Service: Orthopedics;  Laterality: Right;  RIGHT LOWER LEG DEBRIDEMENT,  SKIN GRAFT, VAC PLACEMENT  . renal calculi  12/1997   SVT with induction of anesthesia  . right knee arthroscopy    . SKIN SPLIT GRAFT Right 03/04/2018    Procedure: POSSIBLE SPLIT THICKNESS SKIN GRAFT;  Surgeon: [name], MD;  Location: [facility];  Service: Orthopedics;  Laterality: Right;  . SKIN SPLIT GRAFT Right 04/01/2018   Procedure: REPEAT IRRIGATION AND DEBRIDEMENT RIGHT LEG, APPLY SPLIT THICKNESS SKIN GRAFT;  Surgeon: [name], MD;  Location: [facility];  Service: Orthopedics;  Laterality: Right;  . TONSILLECTOMY AND ADENOIDECTOMY       A IV Location/Drains/Wounds Patient Lines/Drains/Airways Status   Active Line/Drains/Airways    Name:   Placement date:   Placement time:   Site:   Days:   Peripheral IV 08/15/18 Left Antecubital   08/15/18    1010    Antecubital   less than 1   PICC Single Lumen 06/01/18 PICC Right Cephalic 47 cm 1 cm   22/29/79    8921    Cephalic   75   Negative Pressure Wound Therapy Leg Right;Distal   03/04/18  1215    -   164   Negative Pressure Wound Therapy Leg Right;Lower   03/30/18    1616    -   138   Incision (Closed) 03/04/18 Leg Right   03/04/18    1234     164   Incision (Closed) 03/30/18 Leg Right   03/30/18    1607     138   Incision (Closed) 04/01/18 Leg Right   04/01/18    1203     136   Incision (Closed) 05/27/18 Leg Right   05/27/18    0815     80   Wound / Incision (Open or Dehisced) 04/11/15 Other (Comment) Leg Right;Lateral;Lower   04/11/15    1530    Leg   1222   Wound / Incision (Open or Dehisced) 03/28/16 Non-pressure wound Leg Left;Upper open blistered area that is beefy red   03/28/16    1830    Leg   870   Wound / Incision (Open or Dehisced) 03/28/16 Non-pressure wound Leg Upper Wound is an open blister that is beefy red   03/28/16    1830    Leg   870   Wound / Incision (Open or Dehisced) 04/04/18 Non-pressure wound Buttocks Right Reddened open area   04/04/18    1401    Buttocks   133          Intake/Output Last 24 hours No intake or output data in the 24 hours ending 08/15/18 1634  Labs/Imaging Results for orders placed or performed during the hospital encounter of 08/15/18  (from the past 48 hour(s))  Comprehensive metabolic panel     Status: Abnormal   Collection Time: 08/15/18 10:19 AM  Result Value Ref Range   Sodium 140 135 - 145 mmol/L   Potassium 4.5 3.5 - 5.1 mmol/L   Chloride 100 98 - 111 mmol/L   CO2 30 22 - 32 mmol/L   Glucose, Bld 171 (H) 70 - 99 mg/dL   BUN 32 (H) 8 - 23 mg/dL   Creatinine, Ser 2.45 (H) 0.44 - 1.00 mg/dL   Calcium 9.1 8.9 - 10.3 mg/dL   Total Protein 6.5 6.5 - 8.1 g/dL   Albumin 3.3 (L) 3.5 - 5.0 g/dL   AST 20 15 - 41 U/L   ALT 15 0 - 44 U/L   Alkaline Phosphatase 74 38 - 126 U/L   Total Bilirubin 0.6 0.3 - 1.2 mg/dL   GFR calc non Af Amer 20 (L) >60 mL/min   GFR calc Af Amer 23 (L) >60 mL/min   Anion gap 10 5 - 15    Comment: Performed at [facility], [address], [place], [place] [address]  CBC with Differential     Status: Abnormal   Collection Time: 08/15/18 10:19 AM  Result Value Ref Range   WBC 5.2 4.0 - 10.5 K/uL   RBC 3.62 (L) 3.87 - 5.11 MIL/uL   Hemoglobin 10.4 (L) 12.0 - 15.0 g/dL   HCT 34.0 (L) 36.0 - 46.0 %   MCV 93.9 80.0 - 100.0 fL   MCH 28.7 26.0 - 34.0 pg   MCHC 30.6 30.0 - 36.0 g/dL   RDW 16.6 (H) 11.5 - 15.5 %   Platelets 172 150 - 400 K/uL   nRBC 0.4 (H) 0.0 - 0.2 %   Neutrophils Relative % 59 %   Neutro Abs 3.1 1.7 - 7.7 K/uL   Lymphocytes Relative 20 %   Lymphs Abs  1.1 0.7 - 4.0 K/uL   Monocytes Relative 10 %   Monocytes Absolute 0.5 0.1 - 1.0 K/uL   Eosinophils Relative 8 %   Eosinophils Absolute 0.4 0.0 - 0.5 K/uL   Basophils Relative 2 %   Basophils Absolute 0.1 0.0 - 0.1 K/uL   Immature Granulocytes 1 %   Abs Immature Granulocytes 0.03 0.00 - 0.07 K/uL    Comment: Performed at [facility], [place] [address], [place], [place] [address]  Brain natriuretic peptide     Status: None   Collection Time: 08/15/18 10:19 AM  Result Value Ref Range   B Natriuretic Peptide 23.6 0.0 - 100.0 pg/mL    Comment: Performed at [facility] [address] [place], [place]  [address]  I-Stat Creatinine, ED (not at [facility])     Status: Abnormal   Collection Time: 08/15/18 10:51 AM  Result Value Ref Range   Creatinine, Ser 2.40 (H) 0.44 - 1.00 mg/dL  Lactic acid, plasma     Status: None   Collection Time: 08/15/18 11:24 AM  Result Value Ref Range   Lactic Acid, Venous 1.0 0.5 - 1.9 mmol/L    Comment: Performed at [facility] [address] [place], [place] [address]   Dg Chest Portable 1 View  Result Date: 08/15/2018 CLINICAL DATA:  Reason for exam: fluid retention/abnormal lung examPatient reports sob, wheezing, slight cough for approximately 1 week. Denies any chest pains. Patient had a fall in January now has sore to right anterior tibia. Patient reports multiple surgeries to site since January. Hx of diabetes, htn, chf, cad, asthma. Patient denies any prior heart or lung surgeries. EXAM: PORTABLE CHEST 1 VIEW COMPARISON:  06/01/2018 and earlier exams. FINDINGS: Cardiac silhouette is normal in size. No mediastinal or hilar masses. No evidence of adenopathy. Clear lungs.  No pleural effusion or pneumothorax. Skeletal structures are grossly intact. IMPRESSION: No active disease. Electronically Signed   By: Lajean Manes M.D.   On: 08/15/2018 11:11   Vas [place] Lower Extremity Venous (dvt) (only [facility])  Result Date: 08/15/2018  Lower Venous Study Indications: Edema.  Limitations: Poor ultrasound/tissue interface and body habitus. Comparison Study: prior study done 06/01/18 Performing Technologist: Abram Sander RVS  Examination Guidelines: A complete evaluation includes B-mode imaging, spectral Doppler, color Doppler, and power Doppler as needed of all accessible portions of each vessel. Bilateral testing is considered an integral part of a complete examination. Limited examinations for reoccurring indications may be performed as noted.  +---------+---------------+---------+-----------+----------+--------------+ RIGHT     CompressibilityPhasicitySpontaneityPropertiesSummary        +---------+---------------+---------+-----------+----------+--------------+ CFV      Full           Yes      Yes                                 +---------+---------------+---------+-----------+----------+--------------+ SFJ      Full                                                        +---------+---------------+---------+-----------+----------+--------------+ FV Prox  Full                                                        +---------+---------------+---------+-----------+----------+--------------+  FV Mid   Full                                                        +---------+---------------+---------+-----------+----------+--------------+ FV Distal                                             Not visualized +---------+---------------+---------+-----------+----------+--------------+ PFV      Full                                                        +---------+---------------+---------+-----------+----------+--------------+ POP      Full           Yes      Yes                                 +---------+---------------+---------+-----------+----------+--------------+ PTV                                                   Not visualized +---------+---------------+---------+-----------+----------+--------------+ PERO                                                  Not visualized +---------+---------------+---------+-----------+----------+--------------+   +---------+---------------+---------+-----------+----------+--------------+ LEFT     CompressibilityPhasicitySpontaneityPropertiesSummary        +---------+---------------+---------+-----------+----------+--------------+ CFV      Full           Yes      Yes                                 +---------+---------------+---------+-----------+----------+--------------+ SFJ      Full                                                         +---------+---------------+---------+-----------+----------+--------------+ FV Prox  Full                                                        +---------+---------------+---------+-----------+----------+--------------+ FV Mid   Full                                                        +---------+---------------+---------+-----------+----------+--------------+ FV DistalFull                                                        +---------+---------------+---------+-----------+----------+--------------+  PFV      Full                                                        +---------+---------------+---------+-----------+----------+--------------+ POP      Full           Yes      Yes                                 +---------+---------------+---------+-----------+----------+--------------+ PTV      Full                                                        +---------+---------------+---------+-----------+----------+--------------+ PERO                                                  Not visualized +---------+---------------+---------+-----------+----------+--------------+     Summary: Right: There is no evidence of deep vein thrombosis in the lower extremity. However, portions of this examination were limited- see technologist comments above. No cystic structure found in the popliteal fossa. Left: There is no evidence of deep vein thrombosis in the lower extremity. However, portions of this examination were limited- see technologist comments above. No cystic structure found in the popliteal fossa.  *See table(s) above for measurements and observations.    Preliminary     Pending Labs Unresulted Labs (From admission, onward)    Start     Ordered   08/15/18 1530  SARS Coronavirus 2 (CEPHEID - Performed in [facility]), [place]  (Asymptomatic Patients Labs)  Once,   STAT    Question:  Rule Out  Answer:  Yes   08/15/18 1530    08/15/18 1103  Culture, blood (routine x 2)  BLOOD CULTURE X 2,   STAT     08/15/18 1102   08/15/18 1103  Lactic acid, plasma  Now then every 2 hours,   STAT     08/15/18 1102          Vitals/Pain Today's Vitals   08/15/18 1430 08/15/18 1445 08/15/18 1500 08/15/18 1545  BP: 126/75 (!) 117/98 (!) 119/51 105/70  Pulse: 67 71 75   Resp: 11 13 17 16   Temp:      TempSrc:      SpO2: 100% 100% 100%   Weight:      Height:      PainSc:        Isolation Precautions No active isolations  Medications Medications  oxyCODONE (Oxy IR/ROXICODONE) immediate release tablet 2.5 mg (0 mg Oral Hold 08/15/18 1106)  vancomycin (VANCOCIN) 2,500 mg in sodium chloride 0.9 % 500 mL IVPB (has no administration in time range)  vancomycin (VANCOCIN) 1,750 mg in sodium chloride 0.9 % 500 mL IVPB (has no administration in time range)    Mobility manual wheelchair Low fall risk   Focused Assessments Musculoskeltal   R Recommendations: See Admitting Provider Note  Report given to:   Additional Notes: Pt  has open wound to RLE and cellulitis to left heel.  A&O x4.

## 2018-08-15 NOTE — Plan of Care (Signed)

## 2018-08-15 NOTE — Telephone Encounter (Signed)
FYI

## 2018-08-15 NOTE — ED Notes (Signed)
Help get patient undress on the monitor patient is resting with call bell in reach

## 2018-08-15 NOTE — H&P (Signed)
History and Physical:    April [name]   DEY:[id number] DOB: [birth year] DOA: 08/15/2018  Referring MD/provider: PA Valere Dross PCP: [name], MD   Patient coming from: Home  Chief Complaint: "Stinging, electric shocks, needles and stabbing pain in my legs since Thursday".  History of Present Illness:   April [name] is an 68 y.o. female with past medical history significant for morbid obesity, chronic right lower extremity wound, diabetes mellitus complicated by severe neuropathy, chronic kidney disease, hypertension who was in her usual state of health until 4 days prior to admission when she had onset of the "stinging, electric shocks, needles and stabbing pain" in her legs bilaterally.  Patient states that she has had difficulty with neuropathy in the past and has been on multiple different medications for it and is not sure what she is on for it at present.  Note she has had similar symptoms in the past however "it has been quite a while."  Patient contacted her neurologist and her PCP both of whom told her to come to the ED to be evaluated for an infection.  Patient also contacted Dr. [name] who manages her chronic lower extremity ulcer and he noted that he would see her today however she decided to come to the ED instead.  Patient denies any fevers or chills.  Patient thinks that her biggest problem is difficulty ambulating due to the severe pain she has had for the past 4 days.  She notes that at baseline she is able to walk a few steps to a bedside commode however spends most of the day sitting at her kitchen table or in a recliner.  She notes that her legs stay wrapped and she has not had any increased pain in her leg other than the very specific pain that developed suddenly in both of her legs 4 days ago.  Patient denies missing any of her medications.  ED Course:  The patient's legs were unwrapped.  She was noted to have that right lower extremity wound that is being closely  followed by Dr. [name].  ED staff were concerned that she may have had a cellulitis of her left foot and she was started on vancomycin given previous history of MRSA.  She was also noted to have subacute worsening of her chronic renal failure.  Of note her creatinine was 1.3 in May, it was 2.2 on June 12 and it is 2.4 today.  Patient is followed by nephrologist per her own report.  ROS:   ROS   Review of Systems: General: No fever, chills, Endocrine: no heat/cold intolerance, no polyuria Respiratory: No cough,, shortness of breath, hemoptysis Cardiovascular: No palpitations, chest pain GI: No nausea, vomiting, diarrhea, constipation GU: No dysuria, increased frequency CNS: No numbness, dizziness, headache   Past Medical History:   Past Medical History:  Diagnosis Date   Anemia    Asthma    CAD (coronary artery disease)    Carpal tunnel syndrome    Cellulitis of both lower extremities 04/11/2015   CHF (congestive heart failure) ([place])    Chronic kidney disease    CK[facility]- followed by [facility]   Colon polyp, hyperplastic 3149 & 7026   Complication of anesthesia 1999   svt with renal calculi surgery, no problems since   CTS (carpal tunnel syndrome)    bilateral   Diabetes mellitus    Eczema    GERD (gastroesophageal reflux disease)    History of kidney stones 1999  Hyperlipidemia    Hypertension    Leg ulcer ([place]) 04/24/2015   Right lateral leg No evidence of an infection Monitor closely Keep edema controlled    Leg ulcer ([place])    right lower   Meralgia paresthetica    Dr. [name]   Morbid obesity [facility])    Morbid obesity ([place])    Neuropathy    toes and legs   Osteoarthrosis, unspecified whether generalized or localized, lower leg    knee   PUD (peptic ulcer disease)    Shortness of breath dyspnea    with exertion   Sleep apnea    per progress note 02/25/2018   Type II or unspecified type diabetes mellitus without mention of complication, not  stated as uncontrolled    Unspecified hereditary and idiopathic peripheral neuropathy    Urticaria    Vitamin B12 deficiency    Wears glasses    Wound cellulitis    right upper leg, healing well    Past Surgical History:   Past Surgical History:  Procedure Laterality Date   ABDOMINAL HYSTERECTOMY     CARDIAC CATHETERIZATION  2002   non obstructive disease   colonoscopy with polypectomy  2007 & 2012    hyperplastic ;Dr Watt Climes   COLONOSCOPY WITH PROPOFOL N/A 06/04/2015   Procedure: COLONOSCOPY WITH PROPOFOL;  Surgeon: Jerene Bears, MD;  Location: [facility];  Service: Gastroenterology;  Laterality: N/A;   DEBRIDEMENT LEG Right 03/02/2018   WOUND VAC APPLIED   DEBRIDEMENT LEG Right 05/27/2018   RIGHT LOWER LEG DEBRIDEMENT,  SKIN GRAFT, VAC PLACEMENT    DILATION AND CURETTAGE OF UTERUS     multiple   HEMORRHOID SURGERY     I&D EXTREMITY Right 03/02/2018   Procedure: RIGHT LEG DEBRIDEMENT AND PLACE VAC;  Surgeon: [name], MD;  Location: [place];  Service: Orthopedics;  Laterality: Right;   I&D EXTREMITY Right 03/04/2018   Procedure: REPEAT IRRIGATION AND DEBRIDEMENT RIGHT LEG, PLACE WOUND VAC;  Surgeon: [name], MD;  Location: [place];  Service: Orthopedics;  Laterality: Right;   I&D EXTREMITY Right 03/30/2018   Procedure: IRRIGATION AND DEBRIDEMENT RIGHT LEG, APPLY WOUND VAC;  Surgeon: [name], MD;  Location: [place];  Service: Orthopedics;  Laterality: Right;   I&D EXTREMITY Right 05/27/2018   Procedure: RIGHT LOWER LEG DEBRIDEMENT,  SKIN GRAFT, VAC PLACEMENT;  Surgeon: [name], MD;  Location: [place];  Service: Orthopedics;  Laterality: Right;  RIGHT LOWER LEG DEBRIDEMENT,  SKIN GRAFT, VAC PLACEMENT   renal calculi  12/1997   SVT with induction of anesthesia   right knee arthroscopy     SKIN SPLIT GRAFT Right 03/04/2018   Procedure: POSSIBLE SPLIT THICKNESS SKIN GRAFT;  Surgeon: [name], MD;  Location: [place];  Service: Orthopedics;   Laterality: Right;   SKIN SPLIT GRAFT Right 04/01/2018   Procedure: REPEAT IRRIGATION AND DEBRIDEMENT RIGHT LEG, APPLY SPLIT THICKNESS SKIN GRAFT;  Surgeon: [name], MD;  Location: [place];  Service: Orthopedics;  Laterality: Right;   TONSILLECTOMY AND ADENOIDECTOMY      Social History:   Social History   Socioeconomic History   Marital status: Married    Spouse name: Dwight   Number of children: 1   Years of education: BS   Highest education level: Not on file  Occupational History   Occupation: Disabled    Employer: RETIRED  Social Designer, fashion/clothing strain: Not on file   Food insecurity  Worry: Not on file    Inability: Not on file   Transportation needs    Medical: Not on file    Non-medical: Not on file  Tobacco Use   Smoking status: Never Smoker   Smokeless tobacco: Never Used  Substance and Sexual Activity   Alcohol use: No    Alcohol/week: 0.0 standard drinks   Drug use: No   Sexual activity: Not on file  Lifestyle   Physical activity    Days per week: Not on file    Minutes per session: Not on file   Stress: Not on file  Relationships   Social connections    Talks on phone: Not on file    Gets together: Not on file    Attends religious service: Not on file    Active member of club or organization: Not on file    Attends meetings of clubs or organizations: Not on file    Relationship status: Not on file   Intimate partner violence    Fear of current or ex partner: Not on file    Emotionally abused: Not on file    Physically abused: Not on file    Forced sexual activity: Not on file  Other Topics Concern   Not on file  Social History Narrative   Patient lives at home with her husband [name] Patient is retired and has a Conservation officer, nature.    Caffeine - some times.   Right handed.    Allergies   Penicillins, Shellfish allergy, Sulfonamide derivatives, Hydrochlorothiazide w-triamterene, Lotensin [benazepril  hcl], Dipyridamole, Estrogens, Hydrochlorothiazide, Metronidazole, Other, Spironolactone, Sulfa antibiotics, Torsemide, Valsartan, and Mustard [allyl isothiocyanate]  Family history:   Family History  Problem Relation Age of Onset   Colon cancer Mother    Prostate cancer Father    Colon cancer Father    Diabetes Maternal Aunt    Breast cancer Maternal Aunt    Diabetes Maternal Uncle    Diabetes Paternal Aunt    Stroke Paternal Aunt        > 45   Heart disease Paternal Aunt    Diabetes Paternal Uncle    Breast cancer Maternal Aunt         X 2   Breast cancer Cousin     Current Medications:   Prior to Admission medications   Medication Sig Start Date End Date Taking? Authorizing Provider  albuterol (PROVENTIL HFA) 108 (90 Base) MCG/ACT inhaler Inhale 2 puffs into the lungs every 4 (four) hours as needed for wheezing or shortness of breath. 09/11/16 09/27/18 Yes Burns, Claudina Lick, MD  albuterol (PROVENTIL) (2.5 MG/3ML) 0.083% nebulizer solution Take 3 mLs (2.5 mg total) by nebulization every 6 (six) hours as needed for wheezing or shortness of breath. 03/23/17  Yes Burns, Claudina Lick, MD  Amino Acids-Protein Hydrolys (FEEDING SUPPLEMENT, PRO-STAT SUGAR FREE 64,) LIQD Take 30 mLs by mouth 3 (three) times daily.   Yes [provider]  aspirin (ECOTRIN LOW STRENGTH) 81 MG EC tablet Take 81 mg by mouth at bedtime.    Yes [provider]  budesonide-formoterol (SYMBICORT) 160-4.5 MCG/ACT inhaler one - two inhalations every 12 hours; gargle and spit after use Patient taking differently: Inhale 2 puffs into the lungs 2 (two) times daily as needed (shortness of breath). gargle and spit after use 02/19/15  Yes Burns, Claudina Lick, MD  calcium carbonate (TUMS - DOSED IN MG ELEMENTAL CALCIUM) 500 MG chewable tablet Chew 2 tablets by mouth daily as needed for  indigestion or heartburn.   Yes [provider]  carvedilol (COREG) 25 MG tablet TAKE 1 TABLET BY MOUTH TWICE DAILY  WITH A MEAL Patient taking differently: Take 25 mg by mouth daily.  07/18/18  Yes Burns, Claudina Lick, MD  COD LIVER OIL PO Take 1 capsule by mouth daily.   Yes [provider]  cyanocobalamin (,VITAMIN B-12,) 1000 MCG/ML injection INJECT 1ML INTO MUSCLE EVERY 30 DAYS Patient taking differently: Inject 1,000 mcg into the muscle every 30 (thirty) days.  05/06/18  Yes Burns, Claudina Lick, MD  diclofenac sodium (VOLTAREN) 1 % GEL Apply 2 g topically 4 (four) times daily. Patient taking differently: Apply 2 g topically 4 (four) times daily as needed (pain).  03/20/16  Yes Marcial Pacas, MD  diphenhydrAMINE (BENADRYL) 12.5 MG/5ML liquid Take 25 mg by mouth daily as needed for itching.   Yes [provider]  docusate sodium (COLACE) 100 MG capsule Take 1 capsule (100 mg total) by mouth 2 (two) times daily. Patient taking differently: Take 100 mg by mouth 2 (two) times daily as needed for mild constipation.  03/09/18  Yes Rayburn, Neta Mends, PA-C  EPINEPHrine 0.3 mg/0.3 mL IJ SOAJ injection Inject 0.3 mLs (0.3 mg total) into the muscle once as needed for anaphylaxis. 08/12/18  Yes Padgett, Rae Halsted, MD  famotidine (PEPCID) 20 MG tablet Take 1 tablet (20 mg total) by mouth 2 (two) times daily. 12/14/17  Yes Burns, Claudina Lick, MD  Flaxseed, Linseed, (FLAX SEEDS PO) Take 1 capsule by mouth 2 (two) times a day.   Yes [provider]  folic acid (FOLVITE) 098 MCG tablet Take 400 mcg by mouth at bedtime.    Yes [provider]  furosemide (LASIX) 40 MG tablet Take 3 tablets (120 mg total) by mouth 2 (two) times daily. 08/14/16  Yes Burns, Claudina Lick, MD  hydrOXYzine (ATARAX/VISTARIL) 25 MG tablet Take 10 mg by mouth every 6 (six) hours as needed for itching.    Yes [provider]  montelukast (SINGULAIR) 10 MG tablet Take 1 tablet (10 mg total) by mouth at bedtime. 08/12/18  Yes Padgett, Rae Halsted, MD  Multiple Vitamin (MULTIVITAMIN WITH MINERALS) TABS tablet Take 1 tablet  by mouth daily.   Yes [provider]  Omega-3 Fatty Acids (FISH OIL) 1000 MG CAPS Take 1,000 mg by mouth daily.    Yes [provider]  OXcarbazepine (TRILEPTAL) 150 MG tablet Take 1 tablet (150 mg total) by mouth 2 (two) times daily. 09/13/17  Yes Marcial Pacas, MD  oxyCODONE-acetaminophen (PERCOCET/ROXICET) 5-325 MG tablet Take 1 tablet by mouth every 6 (six) hours as needed for severe pain. 06/21/18  Yes Rayburn, Neta Mends, PA-C  permethrin (ELIMITE) 5 % cream Apply 1 application topically as needed. Hives and rash on arms and chest 08/13/18  Yes [provider]  potassium chloride SA (K-DUR,KLOR-CON) 20 MEQ tablet TAKE 3 TABLETS BY MOUTH DAILY. Patient taking differently: Take 60 mEq by mouth daily.  02/25/18  Yes Burns, Claudina Lick, MD  silver sulfADIAZINE (SILVADENE) 1 % cream Apply 1 application topically daily. 06/08/18  Yes [name], MD  simvastatin (ZOCOR) 40 MG tablet Take 1 tablet (40 mg total) by mouth at bedtime. 04/25/18  Yes Burns, Claudina Lick, MD  spironolactone (ALDACTONE) 25 MG tablet TAKE 1 TABLET BY MOUTH ONCE DAILY Patient taking differently: Take 25 mg by mouth daily.  01/12/18  Yes Burns, Claudina Lick, MD  Lancets [facility] ULTRASOFT) lancets Use to help check blood  sugars twice a day Dx E11.9 04/29/15   Burns, Claudina Lick, MD  SYRINGE-NEEDLE, DISP, 3 ML (B-D 3CC LUER-LOK SYR 23GX1-1/2) 23G X 1-1/2" 3 ML MISC USE TO INJECT B-12 ONCE MONTHLY 08/15/18   Burns, Claudina Lick, MD  SYRINGE-NEEDLE, DISP, 3 ML 23G X 1" 3 ML MISC Use with B12 to inject IM every 30 days 05/06/16   [name], MD    Physical Exam:   Vitals:   08/15/18 1430 08/15/18 1445 08/15/18 1500 08/15/18 1545  BP: 126/75 (!) 117/98 (!) 119/51 105/70  Pulse: 67 71 75   Resp: 11 13 17 16   Temp:      TempSrc:      SpO2: 100% 100% 100%   Weight:      Height:         Physical Exam: Blood pressure 105/70, pulse 75, temperature 98.8 F (37.1 C), temperature source Oral, resp. rate 16, height 5'  4" (1.626 m), weight (!) 158.3 kg, SpO2 100 %. Gen: Morbidly obese patient sitting up at 40 degrees in no acute respiratory distress. Eyes: Sclerae anicteric. Conjunctiva not injected. Chest: Decreased air entry bilaterally likely secondary to very large chest wall and decreased inspiratory effort. CV: Distant, regular, no audible murmurs. Abdomen: Obese, NABS, soft, nondistended, nontender.  Extremities: Patient has minimal to no edema from her knees down to her ankle where she had clearly been wrapped.  She has massive edema and adipose tissue just above where the [place] boot had clearly finished.  She has a clean appearing large ulcer right anterior leg with no evidence of infection.  She is extremely tender to light touch in both of her lower extremities from her mid calf all the way down to her toes.  She has redness bilaterally in the mid soles of her feet that do not appear infectious.  I see no evidence of any acute infection in her ankles or her left forefoot as described by ED.   Neuro: Alert and oriented times 3; grossly nonfocal. Psych: Patient is cooperative, logical and coherent with appropriate mood and affect.  Data Review:    Labs: Basic Metabolic Panel: Recent Labs  Lab 08/15/18 1019 08/15/18 1051  NA 140  --   K 4.5  --   CL 100  --   CO2 30  --   GLUCOSE 171*  --   BUN 32*  --   CREATININE 2.45* 2.40*  CALCIUM 9.1  --    Liver Function Tests: Recent Labs  Lab 08/15/18 1019  AST 20  ALT 15  ALKPHOS 74  BILITOT 0.6  PROT 6.5  ALBUMIN 3.3*   No results for input(s): LIPASE, AMYLASE in the last 168 hours. No results for input(s): AMMONIA in the last 168 hours. CBC: Recent Labs  Lab 08/15/18 1019  WBC 5.2  NEUTROABS 3.1  HGB 10.4*  HCT 34.0*  MCV 93.9  PLT 172   Cardiac Enzymes: No results for input(s): CKTOTAL, CKMB, CKMBINDEX, TROPONINI in the last 168 hours.  BNP (last 3 results) No results for input(s): PROBNP in the last 8760 hours. CBG: No  results for input(s): GLUCAP in the last 168 hours.  Urinalysis    Component Value Date/Time   COLORURINE YELLOW 06/02/2018 1319   APPEARANCEUR CLEAR 06/02/2018 1319   LABSPEC 1.011 06/02/2018 1319   PHURINE 6.0 06/02/2018 1319   GLUCOSEU NEGATIVE 06/02/2018 [place] 02/19/2015 1707   HGBUR NEGATIVE 06/02/2018 [place] 06/02/2018 1319  BILIRUBINUR negative 09/05/2013 0832   KETONESUR NEGATIVE 06/02/2018 1319   PROTEINUR NEGATIVE 06/02/2018 1319   UROBILINOGEN 2.0 (A) 02/19/2015 1707   NITRITE NEGATIVE 06/02/2018 1319   LEUKOCYTESUR NEGATIVE 06/02/2018 1319      Radiographic Studies: Dg Chest Portable 1 View  Result Date: 08/15/2018 CLINICAL DATA:  Reason for exam: fluid retention/abnormal lung examPatient reports sob, wheezing, slight cough for approximately 1 week. Denies any chest pains. Patient had a fall in January now has sore to right anterior tibia. Patient reports multiple surgeries to site since January. Hx of diabetes, htn, chf, cad, asthma. Patient denies any prior heart or lung surgeries. EXAM: PORTABLE CHEST 1 VIEW COMPARISON:  06/01/2018 and earlier exams. FINDINGS: Cardiac silhouette is normal in size. No mediastinal or hilar masses. No evidence of adenopathy. Clear lungs.  No pleural effusion or pneumothorax. Skeletal structures are grossly intact. IMPRESSION: No active disease. Electronically Signed   By: Lajean Manes M.D.   On: 08/15/2018 11:11   Vas [place] Lower Extremity Venous (dvt) (only [facility])  Result Date: 08/15/2018  Lower Venous Study Indications: Edema.  Limitations: Poor ultrasound/tissue interface and body habitus. Comparison Study: prior study done 06/01/18 Performing Technologist: Abram Sander RVS  Examination Guidelines: A complete evaluation includes B-mode imaging, spectral Doppler, color Doppler, and power Doppler as needed of all accessible portions of each vessel. Bilateral testing is considered an integral part of a  complete examination. Limited examinations for reoccurring indications may be performed as noted.  +---------+---------------+---------+-----------+----------+--------------+  RIGHT     Compressibility Phasicity Spontaneity Properties Summary         +---------+---------------+---------+-----------+----------+--------------+  CFV       Full            Yes       Yes                                    +---------+---------------+---------+-----------+----------+--------------+  SFJ       Full                                                             +---------+---------------+---------+-----------+----------+--------------+  FV Prox   Full                                                             +---------+---------------+---------+-----------+----------+--------------+  FV Mid    Full                                                             +---------+---------------+---------+-----------+----------+--------------+  FV Distal                                                  Not  visualized  +---------+---------------+---------+-----------+----------+--------------+  PFV       Full                                                             +---------+---------------+---------+-----------+----------+--------------+  POP       Full            Yes       Yes                                    +---------+---------------+---------+-----------+----------+--------------+  PTV                                                        Not visualized  +---------+---------------+---------+-----------+----------+--------------+  PERO                                                       Not visualized  +---------+---------------+---------+-----------+----------+--------------+   +---------+---------------+---------+-----------+----------+--------------+  LEFT      Compressibility Phasicity Spontaneity Properties Summary         +---------+---------------+---------+-----------+----------+--------------+  CFV       Full             Yes       Yes                                    +---------+---------------+---------+-----------+----------+--------------+  SFJ       Full                                                             +---------+---------------+---------+-----------+----------+--------------+  FV Prox   Full                                                             +---------+---------------+---------+-----------+----------+--------------+  FV Mid    Full                                                             +---------+---------------+---------+-----------+----------+--------------+  FV Distal Full                                                             +---------+---------------+---------+-----------+----------+--------------+  PFV       Full                                                             +---------+---------------+---------+-----------+----------+--------------+  POP       Full            Yes       Yes                                    +---------+---------------+---------+-----------+----------+--------------+  PTV       Full                                                             +---------+---------------+---------+-----------+----------+--------------+  PERO                                                       Not visualized  +---------+---------------+---------+-----------+----------+--------------+     Summary: Right: There is no evidence of deep vein thrombosis in the lower extremity. However, portions of this examination were limited- see technologist comments above. No cystic structure found in the popliteal fossa. Left: There is no evidence of deep vein thrombosis in the lower extremity. However, portions of this examination were limited- see technologist comments above. No cystic structure found in the popliteal fossa.  *See table(s) above for measurements and observations.    Preliminary     EKG: Independently reviewed.  Sinus rhythm at 80.  Normal intervals.  Normal axis.  No acute  ST-T wave changes.  Normal EKG.   Assessment/Plan:   Principal Problem:   Acute on chronic kidney failure (HCC) Active Problems:   Body mass index 60.0-69.9, adult (HCC)   Essential hypertension   Chronic diastolic heart failure (HCC)   Bilateral leg edema   Diabetes mellitus with neurological manifestations (HCC)   CKD (chronic kidney disease) stage 3, GFR 30-59 ml/min (HCC)   Diabetic peripheral neuropathy (HCC)   Idiopathic chronic venous hypertension of right lower extremity with ulcer and inflammation ([place])  68 year old female with morbid obesity, severe venous hypertension with chronic right lower extremity wound that is actively being followed by Dr. [name] presents with what sounds like sudden worsening of patient's baseline diabetic neuropathy.  No evidence of infection on examination and patient has no leukocytosis or left shift.  Also noted to have subacute worsening of her kidney function over the past 2 months. We will admit patient primarily for physical therapy evaluation and possible placement as patient is apparently unable to ambulate at all due to severe neuropathy.   NEUROPATHY Patient clearly has very significant neuropathy and is followed for a long time by Dr. [name] of neurology.  She has clearly been tried on multiple different neuropathic medications but I am not sure how successful they have been.  I am not sure what else to offer, can contact Dr. Evelena Leyden  in the morning to see if she has any other suggestions.  In the meanwhile will continue oxcarbazepine at present doses.  It seems that neuropathy may be the final pathway that prevents her from being able to walk at least a little bit.  We will ask physical therapy to see patient to see if they have anything further to offer.  ACUTE ON CHRONIC KIDNEY DISEASE It is unclear to me what has caused her acute renal injury last month. We will send urine for UA with microscopy and sodium and creatinine to see if this is volume  related however I am not sure how accurate this will be since she has been on Lasix. Would contact her outpatient nephrologist for further recommendations in the morning. Patient is on Lasix 120 mg twice daily which she states she has been on for a while, but I will decrease it to 80 mg p.o. twice daily which was her discharge doses at her last admission.  CHRONIC RIGHT LE ULCER Looks clean without evidence of infection.  Followed very closely by Dr. [name].  DM2  Patient does not appear to be on any home medications for diabetes control We will place patient on fingersticks AC at bedtime with sliding scale insulin coverage  HTN Continue carvedilol and spironolactone Change Lasix to 80 mg p.o. twice daily which was the dose she was on upon last discharge  HFpEF Patient appears to be compensated. Continue carvedilol and spironolactone  COPD No evidence for acute flare, continue montelukast, Dulera and as needed albuterol MDI      Other information:   DVT prophylaxis: Lovenox ordered. Code Status: Full code. Family Communication: Patient states there is no need to call her husband. Disposition Plan: Home or TBD Consults called: None Admission status: Observation  The medical decision making on this patient was of high complexity and the patient is at high risk for clinical deterioration, therefore this is a level 3 visit.   Dewaine Oats Tublu Broghan Pannone [facility]  If 7PM-7AM, please contact night-coverage [email or web address] Password [facility] 08/15/2018, 4:38 PM

## 2018-08-15 NOTE — ED Provider Notes (Signed)
[facility] EMERGENCY DEPARTMENT Provider Note   CSN: [id number] Arrival date & time: 08/15/18  1001     History   Chief Complaint Chief Complaint  Patient presents with  . Joint Swelling    HPI April [name] is a 68 y.o. female.     HPI   Patient is a 68 year old female with past medical history of morbid obesity, heart failure with preserved ejection fraction, asthma, type 2 diabetes mellitus, chronic leg ulcers, hyperlipidemia, hypertension presenting for bilateral leg swelling and pain.  Patient reports this is gotten particularly worse over the past 3 days.  She describes symmetric lower extremity edema.  She also reports that the sores she has on her lower extremities are becoming more painful.  Denies any fevers or chills.  Denies any chest pain or increased shortness of breath above her baseline.  Denies any nausea, vomiting or joint pain.  Denies any purulent drainage from the wound of her right leg.  She takes Lasix 120 mg twice daily as well as spironolactone.  No changes in her regimen.  Patient reports that her dry weight fluctuates, and she is not sure if she has gained weight.  Past Medical History:  Diagnosis Date  . Anemia   . Asthma   . CAD (coronary artery disease)   . Carpal tunnel syndrome   . Cellulitis of both lower extremities 04/11/2015  . CHF (congestive heart failure) ([place])   . Chronic kidney disease    [facility]- followed by [facility]  . Colon polyp, hyperplastic 2007 & 2012  . Complication of anesthesia 1999   svt with renal calculi surgery, no problems since  . CTS (carpal tunnel syndrome)    bilateral  . Diabetes mellitus   . Eczema   . GERD (gastroesophageal reflux disease)   . History of kidney stones 1999  . Hyperlipidemia   . Hypertension   . Leg ulcer ([place]) 04/24/2015   Right lateral leg No evidence of an infection Monitor closely Keep edema controlled   . Leg ulcer ([place])    right lower  . Meralgia paresthetica     Dr. [name]  . Morbid obesity ([place])   . Morbid obesity ([place])   . Neuropathy    toes and legs  . Osteoarthrosis, unspecified whether generalized or localized, lower leg    knee  . PUD (peptic ulcer disease)   . Shortness of breath dyspnea    with exertion  . Sleep apnea    per progress note 02/25/2018  . Type II or unspecified type diabetes mellitus without mention of complication, not stated as uncontrolled   . Unspecified hereditary and idiopathic peripheral neuropathy   . Urticaria   . Vitamin B12 deficiency   . Wears glasses   . Wound cellulitis    right upper leg, healing well    Patient Active Problem List   Diagnosis Date Noted  . Onychomycosis 05/19/2018  . Idiopathic chronic venous hypertension of right lower extremity with ulcer and inflammation ([place]) 05/19/2018  . Traumatic wound dehiscence   . Skin lumps 01/14/2018  . Bilateral leg cramps 12/14/2017  . Dry skin dermatitis 06/29/2017  . Left shoulder pain 08/14/2016  . Meralgia paresthetica 07/16/2016  . Chronic left-sided low back pain with left-sided sciatica 06/02/2016  . Whole body pain 03/20/2016  . Diabetic peripheral neuropathy ([place]) 03/20/2016  . Osteopenia 01/11/2016  . CKD (chronic kidney disease) stage 3, GFR 30-59 ml/min (H[facility]) 01/02/2016  . Hand paresthesia 07/18/2015  .  Carpal tunnel syndrome 06/07/2015  . Family history of colon cancer   . Diabetes mellitus with neurological manifestations ([place]) 04/18/2015  . Bilateral leg edema 04/11/2015  . Abnormality of gait 01/03/2015  . Hereditary and idiopathic peripheral neuropathy 01/03/2015  . GERD (gastroesophageal reflux disease) 06/17/2014  . Hx of colonic polyps 12/15/2012  . Vitamin B12 deficiency 11/03/2012  . Intrinsic asthma 03/23/2012  . Chronic diastolic heart failure ([place]) 02/20/2011  . OSA (obstructive sleep apnea) 09/17/2010  . URINARY URGENCY 01/08/2010  . CAD, NATIVE VESSEL 11/20/2008  . OSTEOARTHRITIS, KNEES, BILATERAL, SEVERE  06/14/2008  . Hyperlipidemia 05/10/2007  . Essential hypertension 01/18/2007  . HYPOKALEMIA 04/30/2006  . Body mass index 60.0-69.9, adult ([place]) 04/30/2006  . HX, PERSONAL, PEPTIC ULCER DISEASE 04/30/2006    Past Surgical History:  Procedure Laterality Date  . ABDOMINAL HYSTERECTOMY    . CARDIAC CATHETERIZATION  2002   non obstructive disease  . colonoscopy with polypectomy  2007 & 2012    hyperplastic ;Dr Watt Climes  . COLONOSCOPY WITH PROPOFOL N/A 06/04/2015   Procedure: COLONOSCOPY WITH PROPOFOL;  Surgeon: Jerene Bears, MD;  Location: [facility];  Service: Gastroenterology;  Laterality: N/A;  . DEBRIDEMENT LEG Right 03/02/2018   WOUND VAC APPLIED  . DEBRIDEMENT LEG Right 05/27/2018   RIGHT LOWER LEG DEBRIDEMENT,  SKIN GRAFT, VAC PLACEMENT   . DILATION AND CURETTAGE OF UTERUS     multiple  . HEMORRHOID SURGERY    . I&D EXTREMITY Right 03/02/2018   Procedure: RIGHT LEG DEBRIDEMENT AND PLACE VAC;  Surgeon: [name], MD;  Location: [facility];  Service: Orthopedics;  Laterality: Right;  . I&D EXTREMITY Right 03/04/2018   Procedure: REPEAT IRRIGATION AND DEBRIDEMENT RIGHT LEG, PLACE WOUND VAC;  Surgeon: [name], MD;  Location: [facility];  Service: Orthopedics;  Laterality: Right;  . I&D EXTREMITY Right 03/30/2018   Procedure: IRRIGATION AND DEBRIDEMENT RIGHT LEG, APPLY WOUND VAC;  Surgeon: [name], MD;  Location: [facility];  Service: Orthopedics;  Laterality: Right;  . I&D EXTREMITY Right 05/27/2018   Procedure: RIGHT LOWER LEG DEBRIDEMENT,  SKIN GRAFT, VAC PLACEMENT;  Surgeon: [name], MD;  Location: [facility];  Service: Orthopedics;  Laterality: Right;  RIGHT LOWER LEG DEBRIDEMENT,  SKIN GRAFT, VAC PLACEMENT  . renal calculi  12/1997   SVT with induction of anesthesia  . right knee arthroscopy    . SKIN SPLIT GRAFT Right 03/04/2018   Procedure: POSSIBLE SPLIT THICKNESS SKIN GRAFT;  Surgeon: [name], MD;  Location: [facility];  Service: Orthopedics;  Laterality: Right;  . SKIN  SPLIT GRAFT Right 04/01/2018   Procedure: REPEAT IRRIGATION AND DEBRIDEMENT RIGHT LEG, APPLY SPLIT THICKNESS SKIN GRAFT;  Surgeon: [name], MD;  Location: [facility];  Service: Orthopedics;  Laterality: Right;  . TONSILLECTOMY AND ADENOIDECTOMY       OB History   No obstetric history on file.      Home Medications    Prior to Admission medications   Medication Sig Start Date End Date Taking? Authorizing Provider  albuterol (PROVENTIL HFA) 108 (90 Base) MCG/ACT inhaler Inhale 2 puffs into the lungs every 4 (four) hours as needed for wheezing or shortness of breath. 09/11/16 09/27/18  [name], MD  albuterol (PROVENTIL) (2.5 MG/3ML) 0.083% nebulizer solution Take 3 mLs (2.5 mg total) by nebulization every 6 (six) hours as needed for wheezing or shortness of breath. 03/23/17   [name], MD  Amino Acids-Protein Hydrolys (FEEDING SUPPLEMENT, PRO-STAT  SUGAR FREE 64,) LIQD Take 30 mLs by mouth 3 (three) times daily.    [provider]  aspirin (ECOTRIN LOW STRENGTH) 81 MG EC tablet Take 81 mg by mouth at bedtime.     [provider]  budesonide-formoterol (SYMBICORT) 160-4.5 MCG/ACT inhaler one - two inhalations every 12 hours; gargle and spit after use Patient taking differently: Inhale 2 puffs into the lungs 2 (two) times daily as needed (shortness of breath). gargle and spit after use 02/19/15   Burns, Claudina Lick, MD  calcium carbonate (TUMS - DOSED IN MG ELEMENTAL CALCIUM) 500 MG chewable tablet Chew 2 tablets by mouth daily as needed for indigestion or heartburn.    [provider]  carvedilol (COREG) 25 MG tablet TAKE 1 TABLET BY MOUTH TWICE DAILY WITH A MEAL 07/18/18   Burns, Claudina Lick, MD  COD LIVER OIL PO Take 1 capsule by mouth daily.    [provider]  cyanocobalamin (,VITAMIN B-12,) 1000 MCG/ML injection INJECT 1ML INTO MUSCLE EVERY 30 DAYS Patient taking differently: Inject 1,000 mcg into the muscle every 30 (thirty) days.  05/06/18   [name], MD  diclofenac sodium (VOLTAREN) 1 % GEL Apply 2 g topically 4 (four) times daily. Patient taking differently: Apply 2 g topically 4 (four) times daily as needed (pain).  03/20/16   Marcial Pacas, MD  diphenhydrAMINE (BENADRYL) 12.5 MG/5ML liquid Take 25 mg by mouth daily as needed for itching.    [provider]  docusate sodium (COLACE) 100 MG capsule Take 1 capsule (100 mg total) by mouth 2 (two) times daily. Patient taking differently: Take 100 mg by mouth 2 (two) times daily as needed for mild constipation.  03/09/18   Rayburn, Neta Mends, PA-C  EPINEPHrine 0.3 mg/0.3 mL IJ SOAJ injection Inject 0.3 mLs (0.3 mg total) into the muscle once as needed for anaphylaxis. 08/12/18   Kennith Gain, MD  famotidine (PEPCID) 20 MG tablet Take 1 tablet (20 mg total) by mouth 2 (two) times daily. 12/14/17   Burns, Claudina Lick, MD  Flaxseed, Linseed, (FLAX SEEDS PO) Take 1 capsule by mouth 2 (two) times a day.    [provider]  folic acid (FOLVITE) 322 MCG tablet Take 400 mcg by mouth at bedtime.     [provider]  furosemide (LASIX) 40 MG tablet Take 3 tablets (120 mg total) by mouth 2 (two) times daily. 08/14/16   [name], MD  hydrOXYzine (ATARAX/VISTARIL) 25 MG tablet Take 10 mg by mouth every 6 (six) hours as needed for itching.     [provider]  Lancets [facility] ULTRASOFT) lancets Use to help check blood sugars twice a day Dx E11.9 04/29/15   [name], MD  montelukast (SINGULAIR) 10 MG tablet Take 1 tablet (10 mg total) by mouth at bedtime. 08/12/18   Kennith Gain, MD  Multiple Vitamin (MULTIVITAMIN WITH MINERALS) TABS tablet Take 1 tablet by mouth daily.    [provider]  Omega-3 Fatty Acids (FISH OIL) 1000 MG CAPS Take 1,000 mg by mouth daily.     [provider]  OXcarbazepine (TRILEPTAL) 150 MG tablet Take 1 tablet (150 mg total) by mouth 2 (two) times daily. 09/13/17   Marcial Pacas, MD   oxyCODONE-acetaminophen (PERCOCET/ROXICET) 5-325 MG tablet Take 1 tablet by mouth every 6 (six) hours as needed for severe pain. 06/21/18   Rayburn, Neta Mends, PA-C  potassium chloride SA (K-DUR,KLOR-CON) 20 MEQ tablet TAKE 3 TABLETS BY  MOUTH DAILY. Patient taking differently: Take 60 mEq by mouth daily.  02/25/18   [name], MD  silver sulfADIAZINE (SILVADENE) 1 % cream Apply 1 application topically daily. 06/08/18   [name], MD  simvastatin (ZOCOR) 40 MG tablet Take 1 tablet (40 mg total) by mouth at bedtime. 04/25/18   [name], MD  spironolactone (ALDACTONE) 25 MG tablet TAKE 1 TABLET BY MOUTH ONCE DAILY Patient taking differently: Take 25 mg by mouth daily.  01/12/18   Burns, Claudina Lick, MD  SYRINGE-NEEDLE, DISP, 3 ML (B-D 3CC LUER-LOK SYR 23GX1-1/2) 23G X 1-1/2" 3 ML MISC USE TO INJECT B-12 ONCE MONTHLY 08/15/18   Burns, Claudina Lick, MD  SYRINGE-NEEDLE, DISP, 3 ML 23G X 1" 3 ML MISC Use with B12 to inject IM every 30 days 05/06/16   [name], MD  vancomycin [facility]) 10 G SOLR injection  06/03/18   [provider]    Family History Family History  Problem Relation Age of Onset  . Colon cancer Mother   . Prostate cancer Father   . Colon cancer Father   . Diabetes Maternal Aunt   . Breast cancer Maternal Aunt   . Diabetes Maternal Uncle   . Diabetes Paternal Aunt   . Stroke Paternal Aunt        > 65  . Heart disease Paternal Aunt   . Diabetes Paternal Uncle   . Breast cancer Maternal Aunt         X 2  . Breast cancer Cousin     Social History Social History   Tobacco Use  . Smoking status: Never Smoker  . Smokeless tobacco: Never Used  Substance Use Topics  . Alcohol use: No    Alcohol/week: 0.0 standard drinks  . Drug use: No     Allergies   Penicillins, Shellfish allergy, Sulfonamide derivatives, Hydrochlorothiazide w-triamterene, Lotensin [benazepril hcl], Dipyridamole, Estrogens, Hydrochlorothiazide, Metronidazole, Other, Spironolactone,  Sulfa antibiotics, Torsemide, Valsartan, and Mustard [allyl isothiocyanate]   Review of Systems Review of Systems  Constitutional: Negative for chills and fever.  HENT: Negative for congestion and sore throat.   Eyes: Negative for visual disturbance.  Respiratory: Negative for cough, chest tightness and shortness of breath.   Cardiovascular: Positive for leg swelling. Negative for chest pain and palpitations.  Gastrointestinal: Negative for abdominal pain, nausea and vomiting.  Genitourinary: Negative for dysuria and flank pain.  Musculoskeletal: Positive for arthralgias and myalgias. Negative for back pain.  Skin: Negative for rash.  Neurological: Negative for dizziness, syncope and light-headedness.     Physical Exam Updated Vital Signs BP (!) 130/96   Pulse 81   Temp 98.8 F (37.1 C) (Oral)   Ht 5\' 4"  (1.626 m)   Wt (!) 158.3 kg   SpO2 97%   BMI 59.91 kg/m   Physical Exam Vitals signs and nursing note reviewed.  Constitutional:      General: She is not in acute distress.    Appearance: She is well-developed. She is obese. She is not ill-appearing or diaphoretic.  HENT:     Head: Normocephalic and atraumatic.     Mouth/Throat:     Mouth: Mucous membranes are moist.  Eyes:     Conjunctiva/sclera: Conjunctivae normal.     Pupils: Pupils are equal, round, and reactive to light.  Neck:     Musculoskeletal: Normal range of motion and neck supple.  Cardiovascular:     Rate and Rhythm: Normal rate and regular rhythm.  Heart sounds: S1 normal and S2 normal. No murmur.  Pulmonary:     Effort: Pulmonary effort is normal.     Breath sounds: Normal breath sounds. No wheezing or rales.  Abdominal:     General: There is no distension.     Palpations: Abdomen is soft.     Tenderness: There is no abdominal tenderness. There is no guarding.  Musculoskeletal: Normal range of motion.        General: Tenderness present. No deformity.     Right lower leg: Edema present.      Left lower leg: Edema present.     Comments: LLE Exam: Erythema to plantar aspect of foot. Pitting edema present to level of knee. RLE Exam: Chronic ulcer on the anterior aspect of the shin. Pitting edema to level of the knee.   Lymphadenopathy:     Cervical: No cervical adenopathy.  Skin:    General: Skin is warm and dry.     Findings: No erythema or rash.  Neurological:     Mental Status: She is alert.     Comments: Cranial nerves grossly intact. Patient moves extremities symmetrically and with good coordination.  Psychiatric:        Behavior: Behavior normal.        Thought Content: Thought content normal.        Judgment: Judgment normal.      ED Treatments / Results  Labs (all labs ordered are listed, but only abnormal results are displayed) Labs Reviewed  CBC WITH DIFFERENTIAL/PLATELET - Abnormal; Notable for the following components:      Result Value   RBC 3.62 (*)    Hemoglobin 10.4 (*)    HCT 34.0 (*)    RDW 16.6 (*)    nRBC 0.4 (*)    All other components within normal limits  I-STAT CREATININE, ED - Abnormal; Notable for the following components:   Creatinine, Ser 2.40 (*)    All other components within normal limits  CULTURE, BLOOD (ROUTINE X 2)  CULTURE, BLOOD (ROUTINE X 2)  COMPREHENSIVE METABOLIC PANEL  BRAIN NATRIURETIC PEPTIDE  LACTIC ACID, PLASMA  LACTIC ACID, PLASMA    EKG None  Radiology Dg Chest Portable 1 View  Result Date: 08/15/2018 CLINICAL DATA:  Reason for exam: fluid retention/abnormal lung examPatient reports sob, wheezing, slight cough for approximately 1 week. Denies any chest pains. Patient had a fall in January now has sore to right anterior tibia. Patient reports multiple surgeries to site since January. Hx of diabetes, htn, chf, cad, asthma. Patient denies any prior heart or lung surgeries. EXAM: PORTABLE CHEST 1 VIEW COMPARISON:  06/01/2018 and earlier exams. FINDINGS: Cardiac silhouette is normal in size. No mediastinal or hilar  masses. No evidence of adenopathy. Clear lungs.  No pleural effusion or pneumothorax. Skeletal structures are grossly intact. IMPRESSION: No active disease. Electronically Signed   By: Lajean Manes M.D.   On: 08/15/2018 11:11    Procedures Procedures (including critical care time)  Medications Ordered in ED Medications  oxyCODONE (Oxy IR/ROXICODONE) immediate release tablet 2.5 mg (0 mg Oral Hold 08/15/18 1106)     Initial Impression / Assessment and Plan / ED Course  I have reviewed the triage vital signs and the nursing notes.  Pertinent labs & imaging results that were available during my care of the patient were reviewed by me and considered in my medical decision making (see chart for details).  Clinical Course as of Aug 15 1718  Mon Aug 15, 2018  1102 Pt appeared to have a "falling asleep" episode that quickly resolved. She reports not being able to get her words out for a moment. No seizure like activity. Woke up after about 60 s and was immediately back to baseline.    [AM]  1214 Progressively worsening renal function, but not significantly worse than 1 mo ago.  Creatinine(!): 2.45 [AM]  0940 Spoke with Dr. Jamse Arn of [facility] who will admit pt. Appreciate her involvement.    [AM]    Clinical Course User Index [AM] Albesa Seen, PA-C       This is a 68 year old medically complex female with past medical history of type 2 diabetes mellitus, heart failure with preserved ejection fraction, hypertension, hyperlipidemia, presenting for bilateral leg swelling and differential diagnosis includes CHF, venous insufficiency, cellulitis.  Work-up demonstrating no leukocytosis or left shift.  Patient has progressively worsening renal function with creatinine of 2.45 today.  Please see below for trend.  BNP not elevated.  No lactic acidosis.  DVT studies bilaterally demonstrate no evidence of DVT.  Lab Results  Component Value Date   CREATININE 2.40 (H) 08/15/2018   CREATININE  2.45 (H) 08/15/2018   CREATININE 2.17 (H) 07/15/2018   Clinically, patient has some features of possible early cellulitis in the leg versus stasis ulceration.  She has a history of MRSA colonization.  Case discussed with attending physician, elected single dose of vancomycin here in emergency department.  Patient admitted to hospital medicine for evaluation of possible worsening heart failure in the setting of worsening CKD as patient is maxing out on her dosing of Lasix.  Dr. Jamse Arn of [facility] to admit.  This is a shared visit with Dr. Virgel Manifold. Patient was independently evaluated by this attending physician. Attending physician consulted in evaluation and disposition management.  Final Clinical Impressions(s) / ED Diagnoses   Final diagnoses:  Leg swelling    ED Discharge Orders    None       Tamala Julian 08/15/18 1722    Virgel Manifold, MD 08/19/18 1123

## 2018-08-16 ENCOUNTER — Observation Stay (HOSPITAL_COMMUNITY): Payer: Medicare Other

## 2018-08-16 ENCOUNTER — Telehealth: Payer: Self-pay | Admitting: Orthopedic Surgery

## 2018-08-16 ENCOUNTER — Telehealth: Payer: Self-pay | Admitting: Neurology

## 2018-08-16 DIAGNOSIS — R600 Localized edema: Secondary | ICD-10-CM | POA: Diagnosis not present

## 2018-08-16 DIAGNOSIS — I5032 Chronic diastolic (congestive) heart failure: Secondary | ICD-10-CM | POA: Diagnosis not present

## 2018-08-16 DIAGNOSIS — N179 Acute kidney failure, unspecified: Secondary | ICD-10-CM | POA: Diagnosis not present

## 2018-08-16 DIAGNOSIS — Z6841 Body Mass Index (BMI) 40.0 and over, adult: Secondary | ICD-10-CM | POA: Diagnosis not present

## 2018-08-16 DIAGNOSIS — E1140 Type 2 diabetes mellitus with diabetic neuropathy, unspecified: Secondary | ICD-10-CM

## 2018-08-16 DIAGNOSIS — N183 Chronic kidney disease, stage 3 (moderate): Secondary | ICD-10-CM

## 2018-08-16 LAB — BASIC METABOLIC PANEL
Anion gap: 10 (ref 5–15)
BUN: 28 mg/dL — ABNORMAL HIGH (ref 8–23)
CO2: 28 mmol/L (ref 22–32)
Calcium: 8.5 mg/dL — ABNORMAL LOW (ref 8.9–10.3)
Chloride: 102 mmol/L (ref 98–111)
Creatinine, Ser: 2.03 mg/dL — ABNORMAL HIGH (ref 0.44–1.00)
GFR calc Af Amer: 28 mL/min — ABNORMAL LOW (ref >60)
GFR calc non Af Amer: 25 mL/min — ABNORMAL LOW (ref >60)
Glucose, Bld: 141 mg/dL — ABNORMAL HIGH (ref 70–99)
Potassium: 4 mmol/L (ref 3.5–5.1)
Sodium: 140 mmol/L (ref 135–145)

## 2018-08-16 LAB — CREATININE, URINE, RANDOM: Creatinine, Urine: 75.02 mg/dL

## 2018-08-16 LAB — SODIUM, URINE, RANDOM: Sodium, Ur: 46 mmol/L

## 2018-08-16 LAB — GLUCOSE, CAPILLARY
Glucose-Capillary: 163 mg/dL — ABNORMAL HIGH (ref 70–99)
Glucose-Capillary: 101 mg/dL — ABNORMAL HIGH (ref 70–99)
Glucose-Capillary: 119 mg/dL — ABNORMAL HIGH (ref 70–99)
Glucose-Capillary: 108 mg/dL — ABNORMAL HIGH (ref 70–99)

## 2018-08-16 LAB — CBC
HCT: 31.9 % — ABNORMAL LOW (ref 36.0–46.0)
Hemoglobin: 9.9 g/dL — ABNORMAL LOW (ref 12.0–15.0)
MCH: 28.9 pg (ref 26.0–34.0)
MCHC: 31 g/dL (ref 30.0–36.0)
MCV: 93.3 fL (ref 80.0–100.0)
Platelets: 157 10*3/uL (ref 150–400)
RBC: 3.42 MIL/uL — ABNORMAL LOW (ref 3.87–5.11)
RDW: 16.5 % — ABNORMAL HIGH (ref 11.5–15.5)
WBC: 5.7 10*3/uL (ref 4.0–10.5)
nRBC: 0 % (ref 0.0–0.2)

## 2018-08-16 LAB — URINALYSIS, ROUTINE W REFLEX MICROSCOPIC
Bilirubin Urine: NEGATIVE
Glucose, UA: NEGATIVE mg/dL
Hgb urine dipstick: NEGATIVE
Ketones, ur: NEGATIVE mg/dL
Leukocytes,Ua: NEGATIVE
Nitrite: NEGATIVE
Protein, ur: NEGATIVE mg/dL
Specific Gravity, Urine: 1.01 (ref 1.005–1.030)
pH: 5 (ref 5.0–8.0)

## 2018-08-16 LAB — MRSA PCR SCREENING: MRSA by PCR: NEGATIVE

## 2018-08-16 MED ORDER — LIP MEDEX EX OINT
1.0000 "application " | TOPICAL_OINTMENT | CUTANEOUS | Status: DC | PRN
  Filled 2018-08-16: qty 7

## 2018-08-16 NOTE — Progress Notes (Signed)
PROGRESS NOTE    April [name]  YSA:[id number] DOB: [birth year] DOA: 08/15/2018 PCP: [name], MD    Brief Narrative:   April [name] is an 68 y.o. female with past medical history significant for morbid obesity, chronic right lower extremity wound, diabetes mellitus complicated by severe neuropathy, chronic kidney disease, hypertension who was in her usual state of health until 4 days prior to admission when she had onset of the "stinging, electric shocks, needles and stabbing pain" in her legs bilaterally.  Patient states that she has had difficulty with neuropathy in the past and has been on multiple different medications for it and is not sure what she is on for it at present.  Note she has had similar symptoms in the past however "it has been quite a while."  Patient contacted her neurologist and her PCP both of whom told her to come to the ED to be evaluated for an infection.  Patient also contacted Dr. [name] who manages her chronic lower extremity ulcer and he noted that he would see her today however she decided to come to the ED instead.  Patient denies any fevers or chills.  Patient thinks that her biggest problem is difficulty ambulating due to the severe pain she has had for the past 4 days.  She notes that at baseline she is able to walk a few steps to a bedside commode however spends most of the day sitting at her kitchen table or in a recliner.  She notes that her legs stay wrapped and she has not had any increased pain in her leg other than the very specific pain that developed suddenly in both of her legs 4 days ago.  Patient denies missing any of her medications.   Assessment & Plan:   Principal Problem:   Acute on chronic kidney failure (HCC) Active Problems:   Body mass index 60.0-69.9, adult (HCC)   Essential hypertension   Chronic diastolic heart failure (HCC)   Bilateral leg edema   Diabetes mellitus with neurological manifestations (HCC)   CKD (chronic  kidney disease) stage 3, GFR 30-59 ml/min (HCC)   Diabetic peripheral neuropathy (HCC)   Idiopathic chronic venous hypertension of right lower extremity with ulcer and inflammation ([place])   68 year old female with morbid obesity, severe venous hypertension with chronic right lower extremity wound that is actively being followed by Dr. [name] presents with what sounds like sudden worsening of patient's baseline diabetic neuropathy.  No evidence of infection on examination and patient has no leukocytosis or left shift.  Also noted to have subacute worsening of her kidney function over the past 2 months.   Acute on chronic kidney disease stage III Patient reports follows with nephrology outpatient, Dr. [name].  GFR 48 May 20 with a creatinine of 1.32, now declined to 23 on admission with a creatine of 2.45.  Was previously on furosemide 80 mg p.o. twice daily at time of last discharge, currently on 120 mg p.o. twice daily; question if this is secondary to excessive diuresis and dehydration.  Urinalysis unrevealing.  Urine sodium 46, urine creatinine 75.02; w/ FeNa calculated at 1.1% which is consistent with intrinsic cause such as AIN versus ATN. --Lasix reduced to 80 mg p.o. twice daily --Cr improved 2.45-->2.03 --Check renal ultrasound --Renally dose all medications, avoid nephrotoxins --Strict I's and O's --Daily weights --Follow renal function daily  Severe diabetic neuropathy Patient clearly has very significant neuropathy and is followed for a long time by Dr. [name] of  neurology. She has clearly been tried on multiple different neuropathic medications.  --continue oxcarbazepine --PT evaluation for discharge needs   Chronic right lower extremity ulcer Follows with orthopedics, Dr. [name]. Looks clean without evidence of infection.  Patient is afebrile without leukocytosis.  Patient received dose of vancomycin ordered by EDP, no need for further antibiotics at this time as no clear acute infectious  etiology currently present. --Blood cultures x2: Pending --Bilateral duplex ultrasound negative for DVT --Continue wound care and outpatient follow-up  DM2  Diet controlled.  Hemoglobin A1c 6.4 on 07/15/2018 --Continue CBGs qAC/HS --Insulin sliding scale for coverage  Essential hypertension --Continue carvedilol and spironolactone --Reduced dose of Lasix Lasix to 80 mg p.o.BID; dose she was on upon last discharge  HFpEF Patient appears to be compensated. --Continue carvedilol and spironolactone  COPD No evidence for acute flare --continue montelukast, Dulera and as needed albuterol MDI  Severe morbid obesity BMI 60.  Discussed with patient need for aggressive lifestyle changes and weight loss as this complicates all facets of care; in which this is likely the leading etiology of most of her chronic comorbidities as above.   DVT prophylaxis: Lovenox Code Status: Full code Family Communication: None Disposition Plan: Continue observation status, pending PT evaluation   Consultants:   None  Procedures:  None  Antimicrobials:   Vancomycin 7/13 - 7/13   Subjective: Patient seen and examined at bedside, resting comfortably.  States has been unable to walk over the past 4 days due to her severe neuropathy.  Concerned that she has an acute infection.  Has been dealing with chronic right lower extremity wounds for some time, managed by orthopedics, Dr. [name].  Also concern regarding her progressive worsening of her renal function.  Reports has not seen her nephrologist in some time.  Also reports follows with neurology outpatient in regards to her neuropathy but has not seen her provider in many months as well.  No other complaints at this time.  Denies headache, no chest pain, no palpitations, no shortness of breath, no abdominal pain.  No acute events overnight per nursing staff.  Objective: Vitals:   08/16/18 0346 08/16/18 0738 08/16/18 0830 08/16/18 0844  BP: (!)  109/41  (!) 92/46 (!) 108/48  Pulse: 99  96   Resp: 16  17   Temp: 99.1 F (37.3 C)  98.8 F (37.1 C)   TempSrc: Oral  Oral   SpO2: 99% 98% 95%   Weight:      Height:        Intake/Output Summary (Last 24 hours) at 08/16/2018 1304 Last data filed at 08/16/2018 9767 Gross per 24 hour  Intake 54.96 ml  Output 1650 ml  Net -1595.04 ml   Filed Weights   08/15/18 1158 08/15/18 1736  Weight: (!) 158.3 kg (!) 158 kg    Examination:  General exam: Appears calm and comfortable, morbidly obese Respiratory system: Clear to auscultation. Respiratory effort normal. Cardiovascular system: S1 & S2 heard, RRR. No JVD, murmurs, rubs, gallops or clicks. No pedal edema. Gastrointestinal system: Abdomen is nondistended, protuberant, soft and nontender. No organomegaly or masses felt. Normal bowel sounds heard. Central nervous system: Alert and oriented. No focal neurological deficits. Extremities: Symmetric 5 x 5 power. Skin: Large ulcer noted right anterior leg without purulence or evidence of infection.  Tender to palpation diffusely bilateral lower extremities from mid shin to plantar surfaces of feet.  Edema minimal bilateral lower extremities from just below knee to ankle with appearance of likely prior  Unna boot placement.  Significant edema/adipose tissue noted proximal bilateral knees. Psychiatry: Judgement and insight appear normal. Mood & affect appropriate.     Data Reviewed: I have personally reviewed following labs and imaging studies  CBC: Recent Labs  Lab 08/15/18 1019 08/16/18 0252  WBC 5.2 5.7  NEUTROABS 3.1  --   HGB 10.4* 9.9*  HCT 34.0* 31.9*  MCV 93.9 93.3  PLT 172 443   Basic Metabolic Panel: Recent Labs  Lab 08/15/18 1019 08/15/18 1051 08/16/18 0252  NA 140  --  140  K 4.5  --  4.0  CL 100  --  102  CO2 30  --  28  GLUCOSE 171*  --  141*  BUN 32*  --  28*  CREATININE 2.45* 2.40* 2.03*  CALCIUM 9.1  --  8.5*   GFR: Estimated Creatinine Clearance:  40.2 mL/min (A) (by C-G formula based on SCr of 2.03 mg/dL (H)). Liver Function Tests: Recent Labs  Lab 08/15/18 1019  AST 20  ALT 15  ALKPHOS 74  BILITOT 0.6  PROT 6.5  ALBUMIN 3.3*   No results for input(s): LIPASE, AMYLASE in the last 168 hours. No results for input(s): AMMONIA in the last 168 hours. Coagulation Profile: No results for input(s): INR, PROTIME in the last 168 hours. Cardiac Enzymes: No results for input(s): CKTOTAL, CKMB, CKMBINDEX, TROPONINI in the last 168 hours. BNP (last 3 results) No results for input(s): PROBNP in the last 8760 hours. HbA1C: No results for input(s): HGBA1C in the last 72 hours. CBG: Recent Labs  Lab 08/15/18 1850 08/15/18 2103 08/16/18 0823 08/16/18 1213  GLUCAP 75 154* 163* 101*   Lipid Profile: No results for input(s): CHOL, HDL, LDLCALC, TRIG, CHOLHDL, LDLDIRECT in the last 72 hours. Thyroid Function Tests: No results for input(s): TSH, T4TOTAL, FREET4, T3FREE, THYROIDAB in the last 72 hours. Anemia Panel: No results for input(s): VITAMINB12, FOLATE, FERRITIN, TIBC, IRON, RETICCTPCT in the last 72 hours. Sepsis Labs: Recent Labs  Lab 08/15/18 1124 08/15/18 1830  LATICACIDVEN 1.0 1.5    Recent Results (from the past 240 hour(s))  SARS Coronavirus 2 (CEPHEID - Performed in [facility] hospital lab), Hosp Order     Status: None   Collection Time: 08/15/18  4:55 PM   Specimen: Nasopharyngeal Swab  Result Value Ref Range Status   SARS Coronavirus 2 NEGATIVE NEGATIVE Final    Comment: (NOTE) If result is NEGATIVE SARS-CoV-2 target nucleic acids are NOT DETECTED. The SARS-CoV-2 RNA is generally detectable in upper and lower  respiratory specimens during the acute phase of infection. The lowest  concentration of SARS-CoV-2 viral copies this assay can detect is 250  copies / mL. A negative result does not preclude SARS-CoV-2 infection  and should not be used as the sole basis for treatment or other  patient management  decisions.  A negative result may occur with  improper specimen collection / handling, submission of specimen other  than nasopharyngeal swab, presence of viral mutation(s) within the  areas targeted by this assay, and inadequate number of viral copies  (<250 copies / mL). A negative result must be combined with clinical  observations, patient history, and epidemiological information. If result is POSITIVE SARS-CoV-2 target nucleic acids are DETECTED. The SARS-CoV-2 RNA is generally detectable in upper and lower  respiratory specimens dur ing the acute phase of infection.  Positive  results are indicative of active infection with SARS-CoV-2.  Clinical  correlation with patient history and other diagnostic information is  necessary to determine patient infection status.  Positive results do  not rule out bacterial infection or co-infection with other viruses. If result is PRESUMPTIVE POSTIVE SARS-CoV-2 nucleic acids MAY BE PRESENT.   A presumptive positive result was obtained on the submitted specimen  and confirmed on repeat testing.  While 2019 novel coronavirus  (SARS-CoV-2) nucleic acids may be present in the submitted sample  additional confirmatory testing may be necessary for epidemiological  and / or clinical management purposes  to differentiate between  SARS-CoV-2 and other Sarbecovirus currently known to infect humans.  If clinically indicated additional testing with an alternate test  methodology [phone]) is advised. The SARS-CoV-2 RNA is generally  detectable in upper and lower respiratory sp ecimens during the acute  phase of infection. The expected result is Negative. Fact Sheet for Patients:  StrictlyIdeas.no Fact Sheet for Healthcare Providers: BankingDealers.co.za This test is not yet approved or cleared by the [place] FDA and has been authorized for detection and/or diagnosis of SARS-CoV-2 by FDA under an  Emergency Use Authorization (EUA).  This EUA will remain in effect (meaning this test can be used) for the duration of the COVID-19 declaration under Section 564(b)(1) of the Act, 21 U.S.C. section 360bbb-3(b)(1), unless the authorization is terminated or revoked sooner. Performed at [facility], [place] [address], [place], [place] [address]          Radiology Studies: US Renal  Result Date: 08/16/2018 CLINICAL DATA:  Acute renal failure. EXAM: RENAL / URINARY TRACT ULTRASOUND COMPLETE COMPARISON:  None. FINDINGS: Right Kidney: Renal measurements: 9.7 x 4.4 x 3.9 cm = volume: 88 mL . Echogenicity within normal limits. No mass or hydronephrosis visualized. Left Kidney: Renal measurements: 10.4 x 5.4 x 5.0 cm = volume: 47 mL. Echogenicity within normal limits. No mass or hydronephrosis visualized. Bladder: Appears normal for degree of bladder distention. Bilateral ureteral jets are noted. Calculated volume of 142 mL. IMPRESSION: Normal renal ultrasound. Electronically Signed   By: Marijo Conception M.D.   On: 08/16/2018 12:09   Dg Chest Portable 1 View  Result Date: 08/15/2018 CLINICAL DATA:  Reason for exam: fluid retention/abnormal lung examPatient reports sob, wheezing, slight cough for approximately 1 week. Denies any chest pains. Patient had a fall in January now has sore to right anterior tibia. Patient reports multiple surgeries to site since January. Hx of diabetes, htn, chf, cad, asthma. Patient denies any prior heart or lung surgeries. EXAM: PORTABLE CHEST 1 VIEW COMPARISON:  06/01/2018 and earlier exams. FINDINGS: Cardiac silhouette is normal in size. No mediastinal or hilar masses. No evidence of adenopathy. Clear lungs.  No pleural effusion or pneumothorax. Skeletal structures are grossly intact. IMPRESSION: No active disease. Electronically Signed   By: Lajean Manes M.D.   On: 08/15/2018 11:11   Vas [place] Lower Extremity Venous (dvt) (only [facility])  Result Date: 08/15/2018  Lower  Venous Study Indications: Edema.  Limitations: Poor ultrasound/tissue interface and body habitus. Comparison Study: prior study done 06/01/18 Performing Technologist: Abram Sander RVS  Examination Guidelines: A complete evaluation includes B-mode imaging, spectral Doppler, color Doppler, and power Doppler as needed of all accessible portions of each vessel. Bilateral testing is considered an integral part of a complete examination. Limited examinations for reoccurring indications may be performed as noted.  +---------+---------------+---------+-----------+----------+--------------+  RIGHT     Compressibility Phasicity Spontaneity Properties Summary         +---------+---------------+---------+-----------+----------+--------------+  CFV       Full  Yes       Yes                                    +---------+---------------+---------+-----------+----------+--------------+  SFJ       Full                                                             +---------+---------------+---------+-----------+----------+--------------+  FV Prox   Full                                                             +---------+---------------+---------+-----------+----------+--------------+  FV Mid    Full                                                             +---------+---------------+---------+-----------+----------+--------------+  FV Distal                                                  Not visualized  +---------+---------------+---------+-----------+----------+--------------+  PFV       Full                                                             +---------+---------------+---------+-----------+----------+--------------+  POP       Full            Yes       Yes                                    +---------+---------------+---------+-----------+----------+--------------+  PTV                                                        Not visualized   +---------+---------------+---------+-----------+----------+--------------+  PERO                                                       Not visualized  +---------+---------------+---------+-----------+----------+--------------+   +---------+---------------+---------+-----------+----------+--------------+  LEFT      Compressibility Phasicity Spontaneity Properties Summary         +---------+---------------+---------+-----------+----------+--------------+  CFV       Full  Yes       Yes                                    +---------+---------------+---------+-----------+----------+--------------+  SFJ       Full                                                             +---------+---------------+---------+-----------+----------+--------------+  FV Prox   Full                                                             +---------+---------------+---------+-----------+----------+--------------+  FV Mid    Full                                                             +---------+---------------+---------+-----------+----------+--------------+  FV Distal Full                                                             +---------+---------------+---------+-----------+----------+--------------+  PFV       Full                                                             +---------+---------------+---------+-----------+----------+--------------+  POP       Full            Yes       Yes                                    +---------+---------------+---------+-----------+----------+--------------+  PTV       Full                                                             +---------+---------------+---------+-----------+----------+--------------+  PERO                                                       Not visualized  +---------+---------------+---------+-----------+----------+--------------+     Summary: Right: There is no evidence of deep vein thrombosis in the lower extremity. However, portions of this  examination were limited- see technologist comments above. No cystic structure found in the popliteal fossa. Left: There is no evidence of deep vein thrombosis in the lower extremity. However, portions of this examination were limited- see technologist comments above. No cystic structure found in the popliteal fossa.  *See table(s) above for measurements and observations. Electronically signed by Monica Martinez MD on 08/15/2018 at 5:25:46 PM.    Final         Scheduled Meds:  aspirin EC  81 mg Oral QHS   carvedilol  25 mg Oral Daily   famotidine  20 mg Oral BID   furosemide  80 mg Oral BID   heparin  5,000 Units Subcutaneous Q8H   insulin aspart  0-9 Units Subcutaneous TID WC   mometasone-formoterol  2 puff Inhalation BID   montelukast  10 mg Oral QHS   OXcarbazepine  150 mg Oral BID   oxyCODONE  2.5 mg Oral Once   simvastatin  40 mg Oral QHS   sodium chloride flush  3 mL Intravenous Q12H   spironolactone  25 mg Oral Daily   Continuous Infusions:  sodium chloride 10 mL/hr at 08/16/18 0300     LOS: 0 days    Time spent: 37 minutes; personally reviewing all labs/imaging studies, discussion with nursing staff/consultants    Derreon Consalvo J [place] DO [facility] Pager [phone]  If 7PM-7AM, please contact night-coverage [email or web address] Password [facility] 08/16/2018, 1:04 PM

## 2018-08-16 NOTE — Telephone Encounter (Signed)
pls advise

## 2018-08-16 NOTE — Telephone Encounter (Signed)
FYI-Pt called and stated she called Sat and spoke to the answering service and was advised to go to the hospital due to the Neuropathy pain she was going through so she wanted the provider to be informed that she is in the [facility].

## 2018-08-16 NOTE — Consult Note (Signed)
[facility] Nurse wound consult note Reason for Consult: Erythematous feet, chronic wound on RLE Wound type: chronic venous insufficiency vs lymphedema, PVD Pressure Injury POA: Yes/No/NA Measurement: RLE wound: 2.4cm x 9cm x 0.2cm Bilateral feet, plantar areas from midfoot to hindfoot: red, warm, peeling. Exquisitely sensitive (neuropathic pain) Wound bed: RLE: 90% red with 10% white fibrinous material.  No wounds on bilateral feet, right plantar foot with three areas of callus and peeling, left plantar foot with peeling Drainage (amount, consistency, odor): RLE wound: Light yellow to serous, small to moderate amount.  None from areas on feet Periwound: Right LE wound:  Mild periwound maceration. Dry peeling areas on feet. Dressing procedure/placement/frequency: The chronic wound on the RLE has been under the care of orthopedics MD, Dr. Meridee Score for some time. Patient reports various attempts to gain closure including  Grafting and NPWT.  Wound is now at skin level and in some areas slightly above. Last treatment was with silvadene cream three times weekly. I will continue with silver therapy, albeit in a sheet dressing in an attempt to not only control exudate, but bioburden. Orders provided for Aquacel Ag+ with daily changes. I have provided Nursing with guidance for flotation of the heels. I have nothing to contribute for the bilateral plantar areas of erythema.  If you agree, consider consultation with Dr. [name] while in house.  [facility] nursing team will not follow, but will remain available to this patient, the nursing and medical teams.  Please re-consult if needed. Thanks, Maudie Flakes, MSN, RN, [place], Arther Abbott  Pager# [phone]

## 2018-08-16 NOTE — Telephone Encounter (Signed)
Received voicemail message from patient stating she is in the hospital and asked if Dr [name] will come see her Patient said her heal is really red and the wound care lady came in to look at her wound. Patient said she is in [facility]   Ph# [phone] or cell # [phone]

## 2018-08-16 NOTE — Telephone Encounter (Signed)
Called and spoke with patient today, continue wound care as ordered, dr duda to see in office next week

## 2018-08-16 NOTE — Telephone Encounter (Signed)
Patient called stated she is in hospital. Wanted Dr. [name] to know and to come by to see her. She never mentioned which hospital she was in. Apparently someone came in room and patient disconnected call.

## 2018-08-16 NOTE — Evaluation (Signed)
Physical Therapy Evaluation Patient Details Name: April [name] MRN: [id number] DOB: [birth year] Today's Date: 08/16/2018   History of Present Illness  April [name] is an 68 y.o. female with past medical history significant for morbid obesity, chronic right lower extremity wound, diabetes mellitus complicated by severe neuropathy, chronic kidney disease, hypertension who was in her usual state of health until 4 days prior to admission when she had onset of the "stinging, electric shocks, needles and stabbing pain" in her legs bilaterally.    Clinical Impression  Very nice obese lady that now has sores on bottom of her feet.  We tried standing up from high bed but pt crying and unable to tolerate the pain from weight bearing - not safe to transfer to chair.  She will need bariatric lift to safely get OOB due to pain in legs.  Pt tried hard for me. She wants to be abel to up but knows she is limited due to the pain.  Pt is home alone during the day. She is giong to have to go SNF for higher level care and equipment until feet heal enough to put more weight on her feet.  Pt understands but not happy about current plan. s he is going to talk with family    Follow Up Recommendations SNF;Supervision/Assistance - 24 hour    Equipment Recommendations  (pt will need bariatric lift equipment to get OOB into chair)    Recommendations for Other Services       Precautions / Restrictions Precautions Precautions: Fall Precaution Comments: pt denies recent falls Restrictions Weight Bearing Restrictions: No      Mobility  Bed Mobility Overal bed mobility: Needs Assistance Bed Mobility: Supine to Sit     Supine to sit: Mod assist     General bed mobility comments: pt able to move her legs over to the side of bed and then using rail and my arm - she was able to pull up into sitting.  pt scooted forward and was crying as feet touched the floor. p t sat up for 10 minutes edge of bed - once seh  tried to lay straight back and i had to help ehr sit back up.  Transfers Overall transfer level: Needs assistance Equipment used: Rolling walker (2 wheeled) Transfers: Sit to/from Stand Sit to Stand: Min assist         General transfer comment: Pt tried to stand from elevated bed - with bariatric RW.  pt cried throught her attempt. she was abel to get wegiht on feet but trunk doubled over on RW trying to get weight off feet.  Unsafe for her to try to turn and sit in chair.  pt will need lift to get OOB  Ambulation/Gait             General Gait Details: unable to walk - too painful to put weight on feet  Stairs            Wheelchair Mobility    Modified Rankin (Stroke Patients Only)       Balance Overall balance assessment: Needs assistance     Sitting balance - Comments: pt sat EOB for 10 minutes - o nce she laid back in bed and I had to assist her to sit back up       Standing balance comment: unable to assess as leaning full weight on RW to try to keep some wegiht off feet  Pertinent Vitals/Pain Pain Assessment: 0-10 Pain Score: 10-Worst pain ever Pain Descriptors / Indicators: Burning;Crying;Grimacing;Pressure;Radiating;Sharp;Shooting;Sore;Tender Pain Intervention(s): Limited activity within patient's tolerance;Repositioned;Monitored during session    [place] expects to be discharged to:: Skilled nursing facility                 Additional Comments: pt is cared for by husband and son at home - they work during the day and she is home alone    Prior Function           Comments: pt is cared for by husband and son at home - they work during the day and she is home alone. she has area set up where she can do wheelchiar to [facility].     Hand Dominance        Extremity/Trunk Assessment        Lower Extremity Assessment Lower Extremity Assessment: Generalized weakness        Communication   Communication: No difficulties  Cognition Arousal/Alertness: Awake/alert Behavior During Therapy: WFL for tasks assessed/performed Overall Cognitive Status: Within Functional Limits for tasks assessed                                 General Comments: pt very pleasant and tried hard for me      General Comments General comments (skin integrity, edema, etc.): pt has very red defined area on bottom of left foot (from having feet sitting on wheelchair foot rests??).  pt has red but not as red on right foot.  both sore to touch.  heels elevated from bed - educated pt on keeping all pressure off sides and bottom of feet for max healing.    Exercises     Assessment/Plan    PT Assessment Patient needs continued PT services  PT Problem List Decreased mobility;Decreased activity tolerance;Decreased skin integrity;Decreased knowledge of use of DME;Impaired sensation;Pain       PT Treatment Interventions DME instruction;Therapeutic activities;Gait training;Patient/family education;Therapeutic exercise;Balance training;Functional mobility training    PT Goals (Current goals can be found in the Care Plan section)  Acute Rehab PT Goals Patient Stated Goal: I want to go home wtih family assist PT Goal Formulation: With patient Time For Goal Achievement: 08/16/18 Potential to Achieve Goals: Fair    Frequency Min 2X/week   Barriers to discharge Decreased caregiver support pt was having [facility] at home - was 2x week (and has had PT and OT recently).  she doesnt have full time care as son and husband work    Co-evaluation               AM-PAC PT "6 Clicks" Mobility  Outcome Measure Help needed turning from your back to your side while in a flat bed without using bedrails?: A Lot Help needed moving from lying on your back to sitting on the side of a flat bed without using bedrails?: A Lot Help needed moving to and from a bed to a chair (including a  wheelchair)?: A Lot Help needed standing up from a chair using your arms (e.g., wheelchair or bedside chair)?: A Lot Help needed to walk in hospital room?: Total Help needed climbing 3-5 steps with a railing? : Total 6 Click Score: 10    End of Session   Activity Tolerance: Patient limited by pain Patient left: in bed;with call bell/phone within reach Nurse Communication: Mobility status PT Visit Diagnosis: Difficulty in walking, not elsewhere classified (  R26.2);Pain Pain - Right/Left: Left Pain - part of body: Ankle and joints of foot    Time: 1500-1545 PT Time Calculation (min) (ACUTE ONLY): 45 min   Charges:   PT Evaluation $PT Eval Moderate Complexity: 1 Mod PT Treatments $Therapeutic Activity: 23-37 mins        08/16/2018   Rande Lawman, PT   Loyal Buba 08/16/2018, 4:05 PM

## 2018-08-16 NOTE — Telephone Encounter (Signed)
Can you advise? 

## 2018-08-17 ENCOUNTER — Telehealth: Payer: Self-pay | Admitting: *Deleted

## 2018-08-17 ENCOUNTER — Observation Stay (HOSPITAL_COMMUNITY): Payer: Medicare Other

## 2018-08-17 DIAGNOSIS — E1151 Type 2 diabetes mellitus with diabetic peripheral angiopathy without gangrene: Secondary | ICD-10-CM | POA: Diagnosis present

## 2018-08-17 DIAGNOSIS — Z6841 Body Mass Index (BMI) 40.0 and over, adult: Secondary | ICD-10-CM | POA: Diagnosis not present

## 2018-08-17 DIAGNOSIS — Z8249 Family history of ischemic heart disease and other diseases of the circulatory system: Secondary | ICD-10-CM | POA: Diagnosis not present

## 2018-08-17 DIAGNOSIS — N184 Chronic kidney disease, stage 4 (severe): Secondary | ICD-10-CM | POA: Diagnosis not present

## 2018-08-17 DIAGNOSIS — E1149 Type 2 diabetes mellitus with other diabetic neurological complication: Secondary | ICD-10-CM | POA: Diagnosis present

## 2018-08-17 DIAGNOSIS — J449 Chronic obstructive pulmonary disease, unspecified: Secondary | ICD-10-CM | POA: Diagnosis present

## 2018-08-17 DIAGNOSIS — E1122 Type 2 diabetes mellitus with diabetic chronic kidney disease: Secondary | ICD-10-CM | POA: Diagnosis present

## 2018-08-17 DIAGNOSIS — Z20828 Contact with and (suspected) exposure to other viral communicable diseases: Secondary | ICD-10-CM | POA: Diagnosis present

## 2018-08-17 DIAGNOSIS — R600 Localized edema: Secondary | ICD-10-CM | POA: Diagnosis not present

## 2018-08-17 DIAGNOSIS — Z7951 Long term (current) use of inhaled steroids: Secondary | ICD-10-CM | POA: Diagnosis not present

## 2018-08-17 DIAGNOSIS — G4733 Obstructive sleep apnea (adult) (pediatric): Secondary | ICD-10-CM | POA: Diagnosis present

## 2018-08-17 DIAGNOSIS — R52 Pain, unspecified: Secondary | ICD-10-CM | POA: Diagnosis present

## 2018-08-17 DIAGNOSIS — Z833 Family history of diabetes mellitus: Secondary | ICD-10-CM | POA: Diagnosis not present

## 2018-08-17 DIAGNOSIS — G609 Hereditary and idiopathic neuropathy, unspecified: Secondary | ICD-10-CM | POA: Diagnosis present

## 2018-08-17 DIAGNOSIS — Z7982 Long term (current) use of aspirin: Secondary | ICD-10-CM | POA: Diagnosis not present

## 2018-08-17 DIAGNOSIS — K219 Gastro-esophageal reflux disease without esophagitis: Secondary | ICD-10-CM | POA: Diagnosis present

## 2018-08-17 DIAGNOSIS — I87331 Chronic venous hypertension (idiopathic) with ulcer and inflammation of right lower extremity: Secondary | ICD-10-CM | POA: Diagnosis present

## 2018-08-17 DIAGNOSIS — I5032 Chronic diastolic (congestive) heart failure: Secondary | ICD-10-CM | POA: Diagnosis present

## 2018-08-17 DIAGNOSIS — N183 Chronic kidney disease, stage 3 (moderate): Secondary | ICD-10-CM | POA: Diagnosis present

## 2018-08-17 DIAGNOSIS — E1142 Type 2 diabetes mellitus with diabetic polyneuropathy: Secondary | ICD-10-CM | POA: Diagnosis present

## 2018-08-17 DIAGNOSIS — N179 Acute kidney failure, unspecified: Secondary | ICD-10-CM | POA: Diagnosis present

## 2018-08-17 DIAGNOSIS — E785 Hyperlipidemia, unspecified: Secondary | ICD-10-CM | POA: Diagnosis present

## 2018-08-17 DIAGNOSIS — M7989 Other specified soft tissue disorders: Secondary | ICD-10-CM | POA: Diagnosis present

## 2018-08-17 DIAGNOSIS — I2510 Atherosclerotic heart disease of native coronary artery without angina pectoris: Secondary | ICD-10-CM | POA: Diagnosis present

## 2018-08-17 DIAGNOSIS — M109 Gout, unspecified: Secondary | ICD-10-CM | POA: Diagnosis present

## 2018-08-17 DIAGNOSIS — Z79899 Other long term (current) drug therapy: Secondary | ICD-10-CM | POA: Diagnosis not present

## 2018-08-17 DIAGNOSIS — I130 Hypertensive heart and chronic kidney disease with heart failure and stage 1 through stage 4 chronic kidney disease, or unspecified chronic kidney disease: Secondary | ICD-10-CM | POA: Diagnosis present

## 2018-08-17 LAB — GLUCOSE, CAPILLARY
Glucose-Capillary: 107 mg/dL — ABNORMAL HIGH (ref 70–99)
Glucose-Capillary: 123 mg/dL — ABNORMAL HIGH (ref 70–99)
Glucose-Capillary: 128 mg/dL — ABNORMAL HIGH (ref 70–99)
Glucose-Capillary: 159 mg/dL — ABNORMAL HIGH (ref 70–99)

## 2018-08-17 LAB — BASIC METABOLIC PANEL
Anion gap: 7 (ref 5–15)
BUN: 26 mg/dL — ABNORMAL HIGH (ref 8–23)
CO2: 32 mmol/L (ref 22–32)
Calcium: 8.3 mg/dL — ABNORMAL LOW (ref 8.9–10.3)
Chloride: 102 mmol/L (ref 98–111)
Creatinine, Ser: 2.06 mg/dL — ABNORMAL HIGH (ref 0.44–1.00)
GFR calc Af Amer: 28 mL/min — ABNORMAL LOW (ref >60)
GFR calc non Af Amer: 24 mL/min — ABNORMAL LOW (ref >60)
Glucose, Bld: 109 mg/dL — ABNORMAL HIGH (ref 70–99)
Potassium: 3.6 mmol/L (ref 3.5–5.1)
Sodium: 141 mmol/L (ref 135–145)

## 2018-08-17 MED ORDER — LORAZEPAM 2 MG/ML IJ SOLN
1.0000 mg | Freq: Once | INTRAMUSCULAR | Status: AC
  Administered 2018-08-17: 1 mg via INTRAVENOUS
  Filled 2018-08-17: qty 1

## 2018-08-17 MED ORDER — MORPHINE SULFATE (PF) 2 MG/ML IV SOLN
2.0000 mg | Freq: Once | INTRAVENOUS | Status: AC
  Administered 2018-08-17: 2 mg via INTRAVENOUS
  Filled 2018-08-17: qty 1

## 2018-08-17 MED ORDER — OXCARBAZEPINE 300 MG PO TABS
300.0000 mg | ORAL_TABLET | Freq: Two times a day (BID) | ORAL | Status: DC
  Administered 2018-08-17 – 2018-08-23 (×12): 300 mg via ORAL
  Filled 2018-08-17 (×13): qty 1

## 2018-08-17 MED ORDER — METHOCARBAMOL 500 MG PO TABS
500.0000 mg | ORAL_TABLET | Freq: Four times a day (QID) | ORAL | Status: DC | PRN
  Administered 2018-08-17 – 2018-08-23 (×13): 500 mg via ORAL
  Filled 2018-08-17 (×12): qty 1

## 2018-08-17 MED ORDER — MORPHINE SULFATE (PF) 2 MG/ML IV SOLN
2.0000 mg | INTRAVENOUS | Status: DC | PRN
  Filled 2018-08-17 (×2): qty 1

## 2018-08-17 NOTE — Progress Notes (Signed)
Pt came down to MRI for scans of both feet.  Pt was in a lot of pain and though we tried to make her comfortable she was crying and weeping and despite using techniques that reduce motion, we were unable to acquire diagnostic images.  Pt could possibly have involuntary spasms causing her feet to move also.  Pt is in tremendous pain at this point and is not a good MR candidate at this time.

## 2018-08-17 NOTE — Progress Notes (Addendum)
PROGRESS NOTE    April [name]  WCB:[id number] DOB: 1950/10/20 DOA: 08/15/2018 PCP: [name], MD    Brief Narrative:   April [name] is an 68 y.o. female with past medical history significant for morbid obesity, chronic right lower extremity wound, diabetes mellitus complicated by severe neuropathy, chronic kidney disease, hypertension who was in her usual state of health until 4 days prior to admission when she had onset of the "stinging, electric shocks, needles and stabbing pain" in her legs bilaterally.  Patient states that she has had difficulty with neuropathy in the past and has been on multiple different medications for it and is not sure what she is on for it at present.  Note she has had similar symptoms in the past however "it has been quite a while."  Patient contacted her neurologist and her PCP both of whom told her to come to the ED to be evaluated for an infection.  Patient also contacted Dr. [name] who manages her chronic lower extremity ulcer and he noted that he would see her today however she decided to come to the ED instead.  Patient denies any fevers or chills.  Patient thinks that her biggest problem is difficulty ambulating due to the severe pain she has had for the past 4 days.  She notes that at baseline she is able to walk a few steps to a bedside commode however spends most of the day sitting at her kitchen table or in a recliner.  She notes that her legs stay wrapped and she has not had any increased pain in her leg other than the very specific pain that developed suddenly in both of her legs 4 days ago.  Patient denies missing any of her medications.   Assessment & Plan:   Principal Problem:   Acute on chronic kidney failure (HCC) Active Problems:   Body mass index 60.0-69.9, adult (HCC)   Essential hypertension   Chronic diastolic heart failure (HCC)   Bilateral leg edema   Diabetes mellitus with neurological manifestations (HCC)   CKD (chronic  kidney disease) stage 3, GFR 30-59 ml/min (HCC)   Diabetic peripheral neuropathy (HCC)   Idiopathic chronic venous hypertension of right lower extremity with ulcer and inflammation ([place])   68 year old female with morbid obesity, severe venous hypertension with chronic right lower extremity wound that is actively being followed by Dr. [name] presents with what sounds like sudden worsening of patient's baseline diabetic neuropathy.  No evidence of infection on examination and patient has no leukocytosis or left shift.  Also noted to have subacute worsening of her kidney function over the past 2 months.   Acute on chronic kidney disease stage III Patient reports follows with nephrology outpatient, Dr. [name].  GFR 48 May 20 with a creatinine of 1.32, now declined to 23 on admission with a creatine of 2.45.  Was previously on furosemide 80 mg p.o. twice daily at time of last discharge, currently on 120 mg p.o. twice daily; question if this is secondary to excessive diuresis and dehydration.  Urinalysis unrevealing.  Urine sodium 46, urine creatinine 75.02; w/ FeNa calculated at 1.1% which is consistent with intrinsic cause such as AIN versus ATN. --Net negative 3.2L past 24h and negative 4.8L since admission --Lasix reduced to 80 mg p.o. twice daily --Cr improved 2.45-->2.03-->2.06 --Renal ultrasound without acute findings; no hydronephrosis --Renally dose all medications, avoid nephrotoxins --Strict I's and O's --Daily weights --Follow renal function daily  Severe diabetic neuropathy Patient clearly has  very significant neuropathy and is followed for a long time by Dr. [name] of neurology. She has clearly been tried on multiple different neuropathic medications.  --continue oxcarbazepine --PT/OT recommends SNF; case management for coordination  Chronic right lower extremity ulcer Follows with orthopedics, Dr. [name]. Looks clean without evidence of infection.  Patient is afebrile without leukocytosis.   Patient received dose of vancomycin ordered by EDP, no need for further antibiotics at this time as no clear acute infectious etiology currently present. --Blood cultures x2: NG x 24h --Bilateral duplex ultrasound negative for DVT --Check MR w/o contrast bilateral feet given acute on chronic pain and inability to walk to assess soft tissue to rule out possible underlying infection. --Continue wound care and outpatient follow-up; to be seen in orthopedics office, Dr. [name] 1 week  DM2  Diet controlled.  Hemoglobin A1c 6.4 on 07/15/2018 --Continue CBGs qAC/HS --Insulin sliding scale for coverage  Essential hypertension --Continue carvedilol and spironolactone --Reduced dose of Lasix Lasix to 80 mg p.o.BID; dose she was on upon last discharge  HFpEF Patient appears to be compensated. --Continue carvedilol and spironolactone  COPD No evidence for acute flare --continue montelukast, Dulera and as needed albuterol MDI  Severe morbid obesity BMI 60.  Discussed with patient need for aggressive lifestyle changes and weight loss as this complicates all facets of care; in which this is likely the leading etiology of most of her chronic comorbidities as above.  Addendum 1423: Patient unable to perform MRI due to intractable lower extremity pain.  She is unable to ambulate, which is changed from her normal baseline roughly 4 days ago.  Have increased patient's Trileptal to 300 mg p.o. twice daily, added Robaxin 500 mg p.o. every 6 hours as needed and morphine 2 mg IV every 4 hours as needed for severe breakthrough pain.  PT recommends SNF placement given her inability to walk.  Patient is an unsafe discharge at this time as she lives alone and unable to complete activities of daily living on her own at this time.  Patient has follow-up scheduled with her primary orthopedist, Dr. [name] next week.  Given her continued intractable pain, acute on chronic renal failure with need of close monitoring, and  inability to ambulate, patient requires further inpatient hospitalization for underlying management.  Patient is a high risk for rehospitalization if discharged home even with home health services given her severe comorbidities including morbid obesity.   DVT prophylaxis: Lovenox Code Status: Full code Family Communication: None Disposition Plan: Continue observation status, PT/OT recommends SNF, social work for coordination   Consultants:   None  Procedures:  None  Antimicrobials:   Vancomycin 7/13 - 7/13   Subjective:  Patient seen and examined at bedside, lying comfortably in bed.  States continues with bilateral foot pain.  Evaluated by physical therapy and Occupational Therapy with recommendations of SNF placement.  Patient continues to be concerned about possible infection, although no fever, no leukocytosis or other findings to suggest as such.  Will order MR bilateral feet for further evaluation.  Has been dealing with chronic right lower extremity wounds for some time, managed by orthopedics, Dr. [name].  Also concern regarding her progressive worsening of her renal function.  Reports has not seen her nephrologist in some time.  Also reports follows with neurology outpatient in regards to her neuropathy but has not seen her provider in many months as well.  No other complaints at this time.  Denies headache, no chest pain, no palpitations, no shortness of  breath, no abdominal pain.  No acute events overnight per nursing staff.  Objective: Vitals:   08/17/18 0441 08/17/18 0444 08/17/18 0845 08/17/18 0911  BP:  (!) 113/48  (!) 115/45  Pulse: 76 73  74  Resp: 16 16  16   Temp: 98.6 F (37 C) 99.2 F (37.3 C)  98.6 F (37 C)  TempSrc: Oral Oral  Oral  SpO2: 97% 100% 99% 95%  Weight:      Height:        Intake/Output Summary (Last 24 hours) at 08/17/2018 1030 Last data filed at 08/17/2018 0930 Gross per 24 hour  Intake 723.6 ml  Output 3650 ml  Net -2926.4 ml   Filed  Weights   08/15/18 1158 08/15/18 1736  Weight: (!) 158.3 kg (!) 158 kg    Examination:  General exam: Appears calm and comfortable, morbidly obese Respiratory system: Clear to auscultation. Respiratory effort normal. Cardiovascular system: S1 & S2 heard, RRR. No JVD, murmurs, rubs, gallops or clicks. No pedal edema. Gastrointestinal system: Abdomen is nondistended, protuberant, soft and nontender. No organomegaly or masses felt. Normal bowel sounds heard. Central nervous system: Alert and oriented. No focal neurological deficits. Extremities: Symmetric 5 x 5 power. Skin: Large ulcer noted right anterior leg without purulence or evidence of infection.  Tender to palpation diffusely bilateral lower extremities from mid shin to plantar surfaces of feet.  Edema minimal bilateral lower extremities from just below knee to ankle with appearance of likely prior Unna boot placement.  Significant edema/adipose tissue noted proximal bilateral knees. Psychiatry: Judgement and insight appear normal. Mood & affect appropriate.     Data Reviewed: I have personally reviewed following labs and imaging studies  CBC: Recent Labs  Lab 08/15/18 1019 08/16/18 0252  WBC 5.2 5.7  NEUTROABS 3.1  --   HGB 10.4* 9.9*  HCT 34.0* 31.9*  MCV 93.9 93.3  PLT 172 470   Basic Metabolic Panel: Recent Labs  Lab 08/15/18 1019 08/15/18 1051 08/16/18 0252 08/17/18 0803  NA 140  --  140 141  K 4.5  --  4.0 3.6  CL 100  --  102 102  CO2 30  --  28 32  GLUCOSE 171*  --  141* 109*  BUN 32*  --  28* 26*  CREATININE 2.45* 2.40* 2.03* 2.06*  CALCIUM 9.1  --  8.5* 8.3*   GFR: Estimated Creatinine Clearance: 39.6 mL/min (A) (by C-G formula based on SCr of 2.06 mg/dL (H)). Liver Function Tests: Recent Labs  Lab 08/15/18 1019  AST 20  ALT 15  ALKPHOS 74  BILITOT 0.6  PROT 6.5  ALBUMIN 3.3*   No results for input(s): LIPASE, AMYLASE in the last 168 hours. No results for input(s): AMMONIA in the last 168  hours. Coagulation Profile: No results for input(s): INR, PROTIME in the last 168 hours. Cardiac Enzymes: No results for input(s): CKTOTAL, CKMB, CKMBINDEX, TROPONINI in the last 168 hours. BNP (last 3 results) No results for input(s): PROBNP in the last 8760 hours. HbA1C: No results for input(s): HGBA1C in the last 72 hours. CBG: Recent Labs  Lab 08/16/18 0823 08/16/18 1213 08/16/18 1702 08/16/18 2139 08/17/18 0728  GLUCAP 163* 101* 119* 108* 107*   Lipid Profile: No results for input(s): CHOL, HDL, LDLCALC, TRIG, CHOLHDL, LDLDIRECT in the last 72 hours. Thyroid Function Tests: No results for input(s): TSH, T4TOTAL, FREET4, T3FREE, THYROIDAB in the last 72 hours. Anemia Panel: No results for input(s): VITAMINB12, FOLATE, FERRITIN, TIBC, IRON, RETICCTPCT in the  last 72 hours. Sepsis Labs: Recent Labs  Lab 08/15/18 1124 08/15/18 1830  LATICACIDVEN 1.0 1.5    Recent Results (from the past 240 hour(s))  Culture, blood (routine x 2)     Status: None (Preliminary result)   Collection Time: 08/15/18 12:03 PM   Specimen: BLOOD  Result Value Ref Range Status   Specimen Description BLOOD RIGHT ANTECUBITAL  Final   Special Requests   Final    BOTTLES DRAWN AEROBIC AND ANAEROBIC Blood Culture adequate volume   Culture   Final    NO GROWTH 1 DAY Performed at [facility], [address] [place], [place] [address]    Report Status PENDING  Incomplete  SARS Coronavirus 2 (CEPHEID - Performed in [facility] hospital lab), Hosp Order     Status: None   Collection Time: 08/15/18  4:55 PM   Specimen: Nasopharyngeal Swab  Result Value Ref Range Status   SARS Coronavirus 2 NEGATIVE NEGATIVE Final    Comment: (NOTE) If result is NEGATIVE SARS-CoV-2 target nucleic acids are NOT DETECTED. The SARS-CoV-2 RNA is generally detectable in upper and lower  respiratory specimens during the acute phase of infection. The lowest  concentration of SARS-CoV-2 viral copies this assay can  detect is 250  copies / mL. A negative result does not preclude SARS-CoV-2 infection  and should not be used as the sole basis for treatment or other  patient management decisions.  A negative result may occur with  improper specimen collection / handling, submission of specimen other  than nasopharyngeal swab, presence of viral mutation(s) within the  areas targeted by this assay, and inadequate number of viral copies  (<250 copies / mL). A negative result must be combined with clinical  observations, patient history, and epidemiological information. If result is POSITIVE SARS-CoV-2 target nucleic acids are DETECTED. The SARS-CoV-2 RNA is generally detectable in upper and lower  respiratory specimens dur ing the acute phase of infection.  Positive  results are indicative of active infection with SARS-CoV-2.  Clinical  correlation with patient history and other diagnostic information is  necessary to determine patient infection status.  Positive results do  not rule out bacterial infection or co-infection with other viruses. If result is PRESUMPTIVE POSTIVE SARS-CoV-2 nucleic acids MAY BE PRESENT.   A presumptive positive result was obtained on the submitted specimen  and confirmed on repeat testing.  While 2019 novel coronavirus  (SARS-CoV-2) nucleic acids may be present in the submitted sample  additional confirmatory testing may be necessary for epidemiological  and / or clinical management purposes  to differentiate between  SARS-CoV-2 and other Sarbecovirus currently known to infect humans.  If clinically indicated additional testing with an alternate test  methodology [phone]) is advised. The SARS-CoV-2 RNA is generally  detectable in upper and lower respiratory sp ecimens during the acute  phase of infection. The expected result is Negative. Fact Sheet for Patients:  StrictlyIdeas.no Fact Sheet for Healthcare  Providers: BankingDealers.co.za This test is not yet approved or cleared by the [place] FDA and has been authorized for detection and/or diagnosis of SARS-CoV-2 by FDA under an Emergency Use Authorization (EUA).  This EUA will remain in effect (meaning this test can be used) for the duration of the COVID-19 declaration under Section 564(b)(1) of the Act, 21 U.S.C. section 360bbb-3(b)(1), unless the authorization is terminated or revoked sooner. Performed at [facility], [place] [address], [place], [place] [address]   Culture, blood (Routine X 2) w  Reflex to ID Panel     Status: None (Preliminary result)   Collection Time: 08/15/18 10:29 PM   Specimen: BLOOD RIGHT HAND  Result Value Ref Range Status   Specimen Description BLOOD RIGHT HAND  Final   Special Requests   Final    BOTTLES DRAWN AEROBIC ONLY Blood Culture adequate volume   Culture   Final    NO GROWTH < 24 HOURS Performed at [facility], [place] [address], [place], [place] [address]    Report Status PENDING  Incomplete  MRSA PCR Screening     Status: None   Collection Time: 08/16/18 10:47 AM   Specimen: Nasal Mucosa; Nasopharyngeal  Result Value Ref Range Status   MRSA by PCR NEGATIVE NEGATIVE Final    Comment:        The GeneXpert MRSA Assay (FDA approved for NASAL specimens only), is one component of a comprehensive MRSA colonization surveillance program. It is not intended to diagnose MRSA infection nor to guide or monitor treatment for MRSA infections. Performed at [facility], [place] [address], [place], [place] [address]          Radiology Studies: US Renal  Result Date: 08/16/2018 CLINICAL DATA:  Acute renal failure. EXAM: RENAL / URINARY TRACT ULTRASOUND COMPLETE COMPARISON:  None. FINDINGS: Right Kidney: Renal measurements: 9.7 x 4.4 x 3.9 cm = volume: 88 mL . Echogenicity within normal limits. No mass or hydronephrosis visualized. Left Kidney: Renal  measurements: 10.4 x 5.4 x 5.0 cm = volume: 47 mL. Echogenicity within normal limits. No mass or hydronephrosis visualized. Bladder: Appears normal for degree of bladder distention. Bilateral ureteral jets are noted. Calculated volume of 142 mL. IMPRESSION: Normal renal ultrasound. Electronically Signed   By: Marijo Conception M.D.   On: 08/16/2018 12:09   Dg Chest Portable 1 View  Result Date: 08/15/2018 CLINICAL DATA:  Reason for exam: fluid retention/abnormal lung examPatient reports sob, wheezing, slight cough for approximately 1 week. Denies any chest pains. Patient had a fall in January now has sore to right anterior tibia. Patient reports multiple surgeries to site since January. Hx of diabetes, htn, chf, cad, asthma. Patient denies any prior heart or lung surgeries. EXAM: PORTABLE CHEST 1 VIEW COMPARISON:  06/01/2018 and earlier exams. FINDINGS: Cardiac silhouette is normal in size. No mediastinal or hilar masses. No evidence of adenopathy. Clear lungs.  No pleural effusion or pneumothorax. Skeletal structures are grossly intact. IMPRESSION: No active disease. Electronically Signed   By: Lajean Manes M.D.   On: 08/15/2018 11:11   Vas [place] Lower Extremity Venous (dvt) (only [facility])  Result Date: 08/15/2018  Lower Venous Study Indications: Edema.  Limitations: Poor ultrasound/tissue interface and body habitus. Comparison Study: prior study done 06/01/18 Performing Technologist: Abram Sander RVS  Examination Guidelines: A complete evaluation includes B-mode imaging, spectral Doppler, color Doppler, and power Doppler as needed of all accessible portions of each vessel. Bilateral testing is considered an integral part of a complete examination. Limited examinations for reoccurring indications may be performed as noted.  +---------+---------------+---------+-----------+----------+--------------+  RIGHT     Compressibility Phasicity Spontaneity Properties Summary          +---------+---------------+---------+-----------+----------+--------------+  CFV       Full            Yes       Yes                                    +---------+---------------+---------+-----------+----------+--------------+  SFJ       Full                                                             +---------+---------------+---------+-----------+----------+--------------+  FV Prox   Full                                                             +---------+---------------+---------+-----------+----------+--------------+  FV Mid    Full                                                             +---------+---------------+---------+-----------+----------+--------------+  FV Distal                                                  Not visualized  +---------+---------------+---------+-----------+----------+--------------+  PFV       Full                                                             +---------+---------------+---------+-----------+----------+--------------+  POP       Full            Yes       Yes                                    +---------+---------------+---------+-----------+----------+--------------+  PTV                                                        Not visualized  +---------+---------------+---------+-----------+----------+--------------+  PERO                                                       Not visualized  +---------+---------------+---------+-----------+----------+--------------+   +---------+---------------+---------+-----------+----------+--------------+  LEFT      Compressibility Phasicity Spontaneity Properties Summary         +---------+---------------+---------+-----------+----------+--------------+  CFV       Full            Yes       Yes                                    +---------+---------------+---------+-----------+----------+--------------+  SFJ       Full                                                              +---------+---------------+---------+-----------+----------+--------------+  FV Prox   Full                                                             +---------+---------------+---------+-----------+----------+--------------+  FV Mid    Full                                                             +---------+---------------+---------+-----------+----------+--------------+  FV Distal Full                                                             +---------+---------------+---------+-----------+----------+--------------+  PFV       Full                                                             +---------+---------------+---------+-----------+----------+--------------+  POP       Full            Yes       Yes                                    +---------+---------------+---------+-----------+----------+--------------+  PTV       Full                                                             +---------+---------------+---------+-----------+----------+--------------+  PERO                                                       Not visualized  +---------+---------------+---------+-----------+----------+--------------+     Summary: Right: There is no evidence of deep vein thrombosis in the lower extremity. However, portions of this examination were limited- see technologist comments above. No cystic structure found in the popliteal fossa. Left: There is no evidence of deep vein thrombosis in the lower extremity. However, portions of this examination were limited- see technologist comments above. No cystic structure found in  the popliteal fossa.  *See table(s) above for measurements and observations. Electronically signed by Monica Martinez MD on 08/15/2018 at 5:25:46 PM.    Final         Scheduled Meds:  aspirin EC  81 mg Oral QHS   carvedilol  25 mg Oral Daily   famotidine  20 mg Oral BID   furosemide  80 mg Oral BID   heparin  5,000 Units Subcutaneous Q8H   insulin aspart  0-9 Units  Subcutaneous TID WC   mometasone-formoterol  2 puff Inhalation BID   montelukast  10 mg Oral QHS   OXcarbazepine  150 mg Oral BID   oxyCODONE  2.5 mg Oral Once   simvastatin  40 mg Oral QHS   sodium chloride flush  3 mL Intravenous Q12H   spironolactone  25 mg Oral Daily   Continuous Infusions:  sodium chloride 10 mL/hr at 08/16/18 0300     LOS: 0 days    Time spent: 36 minutes; personally reviewing all labs/imaging studies, discussion with nursing staff/consultants    Elgar Scoggins J [place] DO [facility] Pager [phone]  If 7PM-7AM, please contact night-coverage [email or web address] Password [email or web address] 08/17/2018, 10:30 AM

## 2018-08-17 NOTE — Evaluation (Signed)
Occupational Therapy Evaluation Patient Details Name: April [name] MRN: [id number] DOB: 1950/04/18 Today's Date: 08/17/2018    History of Present Illness April [name] is an 68 y.o. female with past medical history significant for morbid obesity, chronic right lower extremity wound, diabetes mellitus complicated by severe neuropathy, chronic kidney disease, hypertension who was in her usual state of health until 4 days prior to admission when she had onset of the "stinging, electric shocks, needles and stabbing pain" in her legs bilaterally.   Clinical Impression   Pt uses a walker in her home for very short distances. She sleeps in a recliner as she cannot get her legs into the bed without help. Pt can self feed, groom, perform UB bathing and dressing and toilet with set up. She is assisted for LB bathing and dressing, meal prep and housekeeping. Pt is alone during the day as her family works. Pt presents with generalized weakness and severe pain in her feet with just light touch. She is unable to tolerate attempt to stand. Pt does not want to go to SNF, but this is the best choice given her dependence in mobility and no family available during the the day. Will follow acutely.    Follow Up Recommendations  SNF;Supervision/Assistance - 24 hour    Equipment Recommendations  None recommended by OT    Recommendations for Other Services       Precautions / Restrictions Precautions Precautions: Fall Precaution Comments: pt denies recent falls Restrictions Weight Bearing Restrictions: No      Mobility Bed Mobility Overal bed mobility: Needs Assistance Bed Mobility: Supine to Sit     Supine to sit: Mod assist     General bed mobility comments: pulling up on rail and therapist, able to use momentum to pull herself to the EOB, but unable to tolerate feet on floor  Transfers                 General transfer comment: unable to tolerate    Balance Overall balance  assessment: Needs assistance   Sitting balance-Leahy Scale: Fair                                     ADL either performed or assessed with clinical judgement   ADL Overall ADL's : Needs assistance/impaired Eating/Feeding: Set up;Sitting Eating/Feeding Details (indicate cue type and reason): difficulty opening containers Grooming: Set up;Sitting   Upper Body Bathing: Set up;Sitting   Lower Body Bathing: Total assistance;Bed level   Upper Body Dressing : Set up;Sitting   Lower Body Dressing: Total assistance;Bed level       Toileting- Clothing Manipulation and Hygiene: Total assistance;Bed level               Vision Baseline Vision/History: Wears glasses Wears Glasses: At all times Patient Visual Report: No change from baseline       Perception     Praxis      Pertinent Vitals/Pain Pain Assessment: Faces Faces Pain Scale: Hurts whole lot Pain Location: feet Pain Descriptors / Indicators: Burning;Crying;Grimacing;Pressure;Radiating;Sharp;Shooting;Sore;Tender Pain Intervention(s): Monitored during session;Repositioned     Hand Dominance Right   Extremity/Trunk Assessment Upper Extremity Assessment Upper Extremity Assessment: Generalized weakness(reports carpal tunnel B UEs has splints at home)   Lower Extremity Assessment Lower Extremity Assessment: Defer to PT evaluation   Cervical / Trunk Assessment Cervical / Trunk Assessment: Other exceptions(morbid obesity)   Communication Communication Communication: No  difficulties   Cognition Arousal/Alertness: Awake/alert Behavior During Therapy: WFL for tasks assessed/performed Overall Cognitive Status: Within Functional Limits for tasks assessed                                     General Comments       Exercises     Shoulder Instructions      Home Living Family/patient expects to be discharged to:: Private residence Living Arrangements: Spouse/significant  other Available Help at Discharge: Family;Available PRN/intermittently Type of Home: House Home Access: Stairs to enter [employer] of Steps: 2 Entrance Stairs-Rails: Left Home Layout: Two level;1/2 bath on main level;Able to live on main level with bedroom/bathroom     Bathroom Shower/Tub: Teacher, early years/pre: Standard     Home Equipment: Environmental consultant - 2 wheels;Cane - quad;Wheelchair - manual;Bedside commode   Additional Comments: family works, her father checks on her around lunch time      Prior Functioning/Environment Level of Independence: Needs assistance  Gait / Transfers Assistance Needed: limited household ambulator using walker. pt husband helps her out of bed and she mainly sleeps in her recliner ADL's / Homemaking Assistance Needed: assist for LB ADL and all IADL            OT Problem List: Decreased strength;Impaired balance (sitting and/or standing);Decreased activity tolerance;Pain;Decreased knowledge of use of DME or AE;Obesity      OT Treatment/Interventions: Self-care/ADL training;DME and/or AE instruction;Patient/family education;Balance training;Therapeutic activities    OT Goals(Current goals can be found in the care plan section) Acute Rehab OT Goals Patient Stated Goal: I want to go home wtih family assist OT Goal Formulation: With patient Time For Goal Achievement: 08/31/18 Potential to Achieve Goals: Fair ADL Goals Pt Will Transfer to Toilet: stand pivot transfer;bedside commode;with min guard assist Pt Will Perform Toileting - Clothing Manipulation and hygiene: sit to/from stand;with min guard assist Additional ADL Goal #1: Pt will tolerate standing with min guard assist x 2 minutes with RW.  OT Frequency: Min 2X/week   Barriers to D/C: Decreased caregiver support;Inaccessible home environment          Co-evaluation              AM-PAC OT "6 Clicks" Daily Activity     Outcome Measure Help from another person  eating meals?: A Little Help from another person taking care of personal grooming?: A Little Help from another person toileting, which includes using toliet, bedpan, or urinal?: Total Help from another person bathing (including washing, rinsing, drying)?: A Lot Help from another person to put on and taking off regular upper body clothing?: A Little Help from another person to put on and taking off regular lower body clothing?: Total 6 Click Score: 13   End of Session    Activity Tolerance: Patient limited by pain Patient left: in bed;with call bell/phone within reach  OT Visit Diagnosis: Pain;Muscle weakness (generalized) (M62.81)                Time: 1027-2536 OT Time Calculation (min): 22 min Charges:  OT General Charges $OT Visit: 1 Visit OT Evaluation $OT Eval Moderate Complexity: 1 Mod  Nestor Lewandowsky, OTR/L Acute Rehabilitation Services Pager: [phone] Office: [phone]  Malka So 08/17/2018, 10:24 AM

## 2018-08-17 NOTE — Plan of Care (Signed)
  Problem: Clinical Measurements: Goal: Respiratory complications will improve Outcome: Completed/Met   Problem: Activity: Goal: Risk for activity intolerance will decrease Outcome: Progressing   Problem: Pain Managment: Goal: General experience of comfort will improve Outcome: Not Progressing

## 2018-08-17 NOTE — Telephone Encounter (Signed)
MD completed GIA " Professional verification form" called pt there was no answer LMO forms are ready for pick-up. Place in cabinet up front.Marland KitchenJohny Chess

## 2018-08-17 NOTE — Progress Notes (Signed)
Chaplain responded to spiritual consult. Patient requests prayer. Patient was in fairly bright spirits.  She's had good nights and bad nights with the pain in her legs. She knows she cannot walk and needs to rehab.  She has concerns about placement after leaving hospital-worried she may contract Covid if not at home. Misses her husband [name] and son Izora Gala who lives at home with her.  Chaplain provided ministry of presence, support and prayer. Rev. Tamsen Snider Pager [phone]

## 2018-08-18 ENCOUNTER — Inpatient Hospital Stay (HOSPITAL_COMMUNITY): Payer: Medicare Other

## 2018-08-18 DIAGNOSIS — R52 Pain, unspecified: Secondary | ICD-10-CM

## 2018-08-18 LAB — BASIC METABOLIC PANEL
Anion gap: 9 (ref 5–15)
BUN: 24 mg/dL — ABNORMAL HIGH (ref 8–23)
CO2: 29 mmol/L (ref 22–32)
Calcium: 8.3 mg/dL — ABNORMAL LOW (ref 8.9–10.3)
Chloride: 101 mmol/L (ref 98–111)
Creatinine, Ser: 1.92 mg/dL — ABNORMAL HIGH (ref 0.44–1.00)
GFR calc Af Amer: 30 mL/min — ABNORMAL LOW (ref >60)
GFR calc non Af Amer: 26 mL/min — ABNORMAL LOW (ref >60)
Glucose, Bld: 105 mg/dL — ABNORMAL HIGH (ref 70–99)
Potassium: 3.7 mmol/L (ref 3.5–5.1)
Sodium: 139 mmol/L (ref 135–145)

## 2018-08-18 LAB — GLUCOSE, CAPILLARY
Glucose-Capillary: 109 mg/dL — ABNORMAL HIGH (ref 70–99)
Glucose-Capillary: 141 mg/dL — ABNORMAL HIGH (ref 70–99)
Glucose-Capillary: 152 mg/dL — ABNORMAL HIGH (ref 70–99)
Glucose-Capillary: 131 mg/dL — ABNORMAL HIGH (ref 70–99)

## 2018-08-18 LAB — MAGNESIUM: Magnesium: 2.1 mg/dL (ref 1.7–2.4)

## 2018-08-18 MED ORDER — MORPHINE SULFATE (PF) 2 MG/ML IV SOLN
2.0000 mg | INTRAVENOUS | Status: AC
  Administered 2018-08-18: 2 mg via INTRAVENOUS
  Filled 2018-08-18: qty 1

## 2018-08-18 MED ORDER — LORAZEPAM 2 MG/ML IJ SOLN
1.0000 mg | INTRAMUSCULAR | Status: AC
  Administered 2018-08-18: 1 mg via INTRAVENOUS
  Filled 2018-08-18: qty 1

## 2018-08-18 NOTE — TOC Initial Note (Signed)
Transition of Care [facility]) - Initial/Assessment Note    Patient Details  Name: April [name] MRN: [id number] Date of Birth: 1951-01-06  Transition of Care [facility]) CM/SW Contact:    Alberteen Sam, LCSW Phone Number: 08/18/2018, 3:33 PM  Clinical Narrative:                  CSW consulted with patient regarding SNF rec at discharge. Patient is not agreeable and reports she will not go to SNF due to current COVID epidemic. She reports she would like to see if she qualifies for CIR. CSW inquired as to back up option fi patient does not qualify for CIR, she reports she will go home then but she will not go to a SNF. CSW requesting CIR consult at this time to determine discharge plan.   Expected Discharge Plan: (unknown) Barriers to Discharge: Continued Medical Work up   Patient Goals and CMS Choice   CMS Medicare.gov Compare Post Acute Care list provided to:: Patient Choice offered to / list presented to : Patient  Expected Discharge Plan and Services Expected Discharge Plan: (unknown)                                              Prior Living Arrangements/Services   Lives with:: Self Patient language and need for interpreter reviewed:: Yes Do you feel safe going back to the place where you live?: Yes      Need for Family Participation in Patient Care: No (Comment) Care giver support system in place?: Yes (comment)   Criminal Activity/Legal Involvement Pertinent to Current Situation/Hospitalization: No - Comment as needed  Activities of Daily Living Home Assistive Devices/Equipment: Bedside commode/3-in-1, Walker (specify type) ADL Screening (condition at time of admission) Patient's cognitive ability adequate to safely complete daily activities?: Yes Is the patient deaf or have difficulty hearing?: No Does the patient have difficulty seeing, even when wearing glasses/contacts?: No Does the patient have difficulty concentrating, remembering, or making decisions?:  No Patient able to express need for assistance with ADLs?: Yes Does the patient have difficulty dressing or bathing?: Yes Independently performs ADLs?: Yes (appropriate for developmental age) Does the patient have difficulty walking or climbing stairs?: Yes Weakness of Legs: Both Weakness of Arms/Hands: None  Permission Sought/Granted Permission sought to share information with : Case Manager, Investment banker, corporate granted to share info w AGENCY: HH        Emotional Assessment Appearance:: Appears stated age Attitude/Demeanor/Rapport: Gracious Affect (typically observed): Calm Orientation: : Oriented to Self, Oriented to Place, Oriented to  Time, Oriented to Situation Alcohol / Substance Use: Not Applicable Psych Involvement: No (comment)  Admission diagnosis:  Leg swelling [M79.89] Patient Active Problem List   Diagnosis Date Noted  . Intractable pain 08/17/2018  . Acute on chronic kidney failure ([place]) 08/15/2018  . Onychomycosis 05/19/2018  . Idiopathic chronic venous hypertension of right lower extremity with ulcer and inflammation ([place]) 05/19/2018  . Traumatic wound dehiscence   . Skin lumps 01/14/2018  . Bilateral leg cramps 12/14/2017  . Dry skin dermatitis 06/29/2017  . Left shoulder pain 08/14/2016  . Meralgia paresthetica 07/16/2016  . Chronic left-sided low back pain with left-sided sciatica 06/02/2016  . Whole body pain 03/20/2016  . Diabetic peripheral neuropathy ([place]) 03/20/2016  . Osteopenia 01/11/2016  . CKD (chronic kidney disease) stage  3, GFR 30-59 ml/min ([facility]) 01/02/2016  . Hand paresthesia 07/18/2015  . Carpal tunnel syndrome 06/07/2015  . Family history of colon cancer   . Diabetes mellitus with neurological manifestations ([place]) 04/18/2015  . Bilateral leg edema 04/11/2015  . Abnormality of gait 01/03/2015  . Hereditary and idiopathic peripheral neuropathy 01/03/2015  . GERD (gastroesophageal reflux disease) 06/17/2014   . Hx of colonic polyps 12/15/2012  . Vitamin B12 deficiency 11/03/2012  . Intrinsic asthma 03/23/2012  . Chronic diastolic heart failure ([place]) 02/20/2011  . OSA (obstructive sleep apnea) 09/17/2010  . URINARY URGENCY 01/08/2010  . CAD, NATIVE VESSEL 11/20/2008  . OSTEOARTHRITIS, KNEES, BILATERAL, SEVERE 06/14/2008  . Hyperlipidemia 05/10/2007  . Essential hypertension 01/18/2007  . HYPOKALEMIA 04/30/2006  . Body mass index 60.0-69.9, adult ([place]) 04/30/2006  . HX, PERSONAL, PEPTIC ULCER DISEASE 04/30/2006   PCP:  [name], MD Pharmacy:   [facility] [place], [place] - [address] [place] [address] Phone: [phone] Fax: [phone]     Social Determinants of Health (SDOH) Interventions    Readmission Risk Interventions No flowsheet data found.

## 2018-08-18 NOTE — Progress Notes (Signed)
PROGRESS NOTE    April [name]  RCV:[id number] DOB: 1950-08-21 DOA: 08/15/2018 PCP: [name], MD    Brief Narrative:   April [name] is an 68 y.o. female with past medical history significant for morbid obesity, chronic right lower extremity wound, diabetes mellitus complicated by severe neuropathy, chronic kidney disease, hypertension who was in her usual state of health until 4 days prior to admission when she had onset of the "stinging, electric shocks, needles and stabbing pain" in her legs bilaterally.  Patient states that she has had difficulty with neuropathy in the past and has been on multiple different medications for it and is not sure what she is on for it at present.  Note she has had similar symptoms in the past however "it has been quite a while."  Patient contacted her neurologist and her PCP both of whom told her to come to the ED to be evaluated for an infection.  Patient also contacted Dr. [name] who manages her chronic lower extremity ulcer and he noted that he would see her today however she decided to come to the ED instead.  Patient denies any fevers or chills.  Patient thinks that her biggest problem is difficulty ambulating due to the severe pain she has had for the past 4 days.  She notes that at baseline she is able to walk a few steps to a bedside commode however spends most of the day sitting at her kitchen table or in a recliner.  She notes that her legs stay wrapped and she has not had any increased pain in her leg other than the very specific pain that developed suddenly in both of her legs 4 days ago.  Patient denies missing any of her medications.   Assessment & Plan:   Principal Problem:   Acute on chronic kidney failure (HCC) Active Problems:   Body mass index 60.0-69.9, adult (HCC)   Essential hypertension   Chronic diastolic heart failure (HCC)   Bilateral leg edema   Diabetes mellitus with neurological manifestations (HCC)   CKD (chronic  kidney disease) stage 3, GFR 30-59 ml/min (HCC)   Diabetic peripheral neuropathy (HCC)   Idiopathic chronic venous hypertension of right lower extremity with ulcer and inflammation (HCC)   Intractable pain   68 year old female with morbid obesity, severe venous hypertension with chronic right lower extremity wound that is actively being followed by Dr. [name] presents with what sounds like sudden worsening of patient's baseline diabetic neuropathy.  No evidence of infection on examination and patient has no leukocytosis or left shift.  Also noted to have subacute worsening of her kidney function over the past 2 months.   Acute on chronic kidney disease stage III Patient reports follows with nephrology outpatient, Dr. [name].  GFR 48 May 20 with a creatinine of 1.32, now declined to 23 on admission with a creatine of 2.45.  Was previously on furosemide 80 mg p.o. twice daily at time of last discharge, currently on 120 mg p.o. twice daily; question if this is secondary to excessive diuresis and dehydration.  Urinalysis unrevealing.  Urine sodium 46, urine creatinine 75.02; w/ FeNa calculated at 1.1% which is consistent with intrinsic cause such as AIN versus ATN. --Net negative 3.2L past 24h and negative 4.8L since admission --Lasix reduced to 80 mg p.o. twice daily --Cr improved 2.45-->2.03-->2.06-->1.92 --Renal ultrasound without acute findings; no hydronephrosis --Renally dose all medications, avoid nephrotoxins --Strict I's and O's --Daily weights --Follow renal function daily  Severe diabetic  neuropathy Patient clearly has very significant neuropathy and is followed for a long time by Dr. [name] of neurology. She has clearly been tried on multiple different neuropathic medications.  --oxcarbazepine increased to 300mg  PO BID --Robaxin 500mg  PO q6h prn --PT/OT recommends SNF; social work for coordination  Chronic right lower extremity ulcer Follows with orthopedics, Dr. [name]. Looks clean  without evidence of infection.  Patient is afebrile without leukocytosis.  Patient received dose of vancomycin ordered by EDP, no need for further antibiotics at this time as no clear acute infectious etiology currently present. --Blood cultures x2: NG x 2 days --Bilateral duplex ultrasound negative for DVT --Check MR w/o contrast bilateral feet given acute on chronic pain and inability to walk to assess soft tissue to rule out possible underlying infection.  Ativan/morphine on call prior to imaging study --Continue wound care and outpatient follow-up; to be seen in orthopedics office, Dr. [name] 1 week  DM2  Diet controlled.  Hemoglobin A1c 6.4 on 07/15/2018 --Continue CBGs qAC/HS --Insulin sliding scale for coverage  Essential hypertension --Continue carvedilol and spironolactone --Reduced dose of Lasix to 80 mg p.o.BID; dose she was on upon last discharge  HFpEF Patient appears to be compensated. --Continue carvedilol and spironolactone  COPD No evidence for acute flare --continue montelukast, Dulera and as needed albuterol MDI  Severe morbid obesity BMI 60.  Discussed with patient need for aggressive lifestyle changes and weight loss as this complicates all facets of care; in which this is likely the leading etiology of most of her chronic comorbidities as above.  DVT prophylaxis: Lovenox Code Status: Full code Family Communication: None Disposition Plan: Continue observation status, PT/OT recommends SNF, social work for coordination   Consultants:   None  Procedures:  None  Antimicrobials:   Vancomycin 7/13 - 7/13   Subjective:  Patient seen and examined at bedside, lying comfortably in bed.  Patient was unable to complete MRI yesterday due to severe pain and reported distress with crying per MR tech.  Discussed with patient this morning, there should be no difference if she is lying comfortably in her bed versus on the MRI scanner table and that she needs to  proceed if she wants further evaluation of her plantar surface pain to rule out infection. No other complaints at this time.  Denies headache, no chest pain, no palpitations, no shortness of breath, no abdominal pain.  No acute events overnight per nursing staff.  Objective: Vitals:   08/17/18 1946 08/17/18 2055 08/18/18 0500 08/18/18 0846  BP: (!) 107/35  113/62   Pulse: 87 85 88   Resp: 16 20 16    Temp: 99.8 F (37.7 C)  97.8 F (36.6 C)   TempSrc: Oral  Oral   SpO2: 98% 95% 94% 93%  Weight:      Height:        Intake/Output Summary (Last 24 hours) at 08/18/2018 1131 Last data filed at 08/18/2018 0900 Gross per 24 hour  Intake 1680 ml  Output 2100 ml  Net -420 ml   Filed Weights   08/15/18 1158 08/15/18 1736  Weight: (!) 158.3 kg (!) 158 kg    Examination:  General exam: Appears calm and comfortable, morbidly obese Respiratory system: Clear to auscultation. Respiratory effort normal. Cardiovascular system: S1 & S2 heard, RRR. No JVD, murmurs, rubs, gallops or clicks. No pedal edema. Gastrointestinal system: Abdomen is nondistended, protuberant, soft and nontender. No organomegaly or masses felt. Normal bowel sounds heard. Central nervous system: Alert and oriented. No focal  neurological deficits. Extremities: Symmetric 5 x 5 power. Skin: Large ulcer noted right anterior leg without purulence or evidence of infection.  Tender to palpation diffusely bilateral lower extremities from mid shin to plantar surfaces of feet.  Edema minimal bilateral lower extremities from just below knee to ankle with appearance of likely prior Unna boot placement.  Significant edema/adipose tissue noted proximal bilateral knees. Psychiatry: Judgement and insight appear normal. Mood & affect appropriate.     Data Reviewed: I have personally reviewed following labs and imaging studies  CBC: Recent Labs  Lab 08/15/18 1019 08/16/18 0252  WBC 5.2 5.7  NEUTROABS 3.1  --   HGB 10.4* 9.9*  HCT  34.0* 31.9*  MCV 93.9 93.3  PLT 172 782   Basic Metabolic Panel: Recent Labs  Lab 08/15/18 1019 08/15/18 1051 08/16/18 0252 08/17/18 0803 08/18/18 0346  NA 140  --  140 141 139  K 4.5  --  4.0 3.6 3.7  CL 100  --  102 102 101  CO2 30  --  28 32 29  GLUCOSE 171*  --  141* 109* 105*  BUN 32*  --  28* 26* 24*  CREATININE 2.45* 2.40* 2.03* 2.06* 1.92*  CALCIUM 9.1  --  8.5* 8.3* 8.3*  MG  --   --   --   --  2.1   GFR: Estimated Creatinine Clearance: 42.5 mL/min (A) (by C-G formula based on SCr of 1.92 mg/dL (H)). Liver Function Tests: Recent Labs  Lab 08/15/18 1019  AST 20  ALT 15  ALKPHOS 74  BILITOT 0.6  PROT 6.5  ALBUMIN 3.3*   No results for input(s): LIPASE, AMYLASE in the last 168 hours. No results for input(s): AMMONIA in the last 168 hours. Coagulation Profile: No results for input(s): INR, PROTIME in the last 168 hours. Cardiac Enzymes: No results for input(s): CKTOTAL, CKMB, CKMBINDEX, TROPONINI in the last 168 hours. BNP (last 3 results) No results for input(s): PROBNP in the last 8760 hours. HbA1C: No results for input(s): HGBA1C in the last 72 hours. CBG: Recent Labs  Lab 08/17/18 1324 08/17/18 1626 08/17/18 2117 08/18/18 0745 08/18/18 1118  GLUCAP 123* 128* 159* 109* 141*   Lipid Profile: No results for input(s): CHOL, HDL, LDLCALC, TRIG, CHOLHDL, LDLDIRECT in the last 72 hours. Thyroid Function Tests: No results for input(s): TSH, T4TOTAL, FREET4, T3FREE, THYROIDAB in the last 72 hours. Anemia Panel: No results for input(s): VITAMINB12, FOLATE, FERRITIN, TIBC, IRON, RETICCTPCT in the last 72 hours. Sepsis Labs: Recent Labs  Lab 08/15/18 1124 08/15/18 1830  LATICACIDVEN 1.0 1.5    Recent Results (from the past 240 hour(s))  Culture, blood (routine x 2)     Status: None (Preliminary result)   Collection Time: 08/15/18 12:03 PM   Specimen: BLOOD  Result Value Ref Range Status   Specimen Description BLOOD RIGHT ANTECUBITAL  Final    Special Requests   Final    BOTTLES DRAWN AEROBIC AND ANAEROBIC Blood Culture adequate volume   Culture   Final    NO GROWTH 2 DAYS Performed at [facility], [address], [place], [place] [address]    Report Status PENDING  Incomplete  SARS Coronavirus 2 (CEPHEID - Performed in [place] hospital lab), Hosp Order     Status: None   Collection Time: 08/15/18  4:55 PM   Specimen: Nasopharyngeal Swab  Result Value Ref Range Status   SARS Coronavirus 2 NEGATIVE NEGATIVE Final    Comment: (NOTE) If result  is NEGATIVE SARS-CoV-2 target nucleic acids are NOT DETECTED. The SARS-CoV-2 RNA is generally detectable in upper and lower  respiratory specimens during the acute phase of infection. The lowest  concentration of SARS-CoV-2 viral copies this assay can detect is 250  copies / mL. A negative result does not preclude SARS-CoV-2 infection  and should not be used as the sole basis for treatment or other  patient management decisions.  A negative result may occur with  improper specimen collection / handling, submission of specimen other  than nasopharyngeal swab, presence of viral mutation(s) within the  areas targeted by this assay, and inadequate number of viral copies  (<250 copies / mL). A negative result must be combined with clinical  observations, patient history, and epidemiological information. If result is POSITIVE SARS-CoV-2 target nucleic acids are DETECTED. The SARS-CoV-2 RNA is generally detectable in upper and lower  respiratory specimens dur ing the acute phase of infection.  Positive  results are indicative of active infection with SARS-CoV-2.  Clinical  correlation with patient history and other diagnostic information is  necessary to determine patient infection status.  Positive results do  not rule out bacterial infection or co-infection with other viruses. If result is PRESUMPTIVE POSTIVE SARS-CoV-2 nucleic acids MAY BE PRESENT.   A presumptive positive  result was obtained on the submitted specimen  and confirmed on repeat testing.  While 2019 novel coronavirus  (SARS-CoV-2) nucleic acids may be present in the submitted sample  additional confirmatory testing may be necessary for epidemiological  and / or clinical management purposes  to differentiate between  SARS-CoV-2 and other Sarbecovirus currently known to infect humans.  If clinically indicated additional testing with an alternate test  methodology [phone]) is advised. The SARS-CoV-2 RNA is generally  detectable in upper and lower respiratory sp ecimens during the acute  phase of infection. The expected result is Negative. Fact Sheet for Patients:  StrictlyIdeas.no Fact Sheet for Healthcare Providers: BankingDealers.co.za This test is not yet approved or cleared by the [place] FDA and has been authorized for detection and/or diagnosis of SARS-CoV-2 by FDA under an Emergency Use Authorization (EUA).  This EUA will remain in effect (meaning this test can be used) for the duration of the COVID-19 declaration under Section 564(b)(1) of the Act, 21 U.S.C. section 360bbb-3(b)(1), unless the authorization is terminated or revoked sooner. Performed at [facility], [place] [address], [place], [place] [address]   Culture, blood (Routine X 2) w Reflex to ID Panel     Status: None (Preliminary result)   Collection Time: 08/15/18 10:29 PM   Specimen: BLOOD RIGHT HAND  Result Value Ref Range Status   Specimen Description BLOOD RIGHT HAND  Final   Special Requests   Final    BOTTLES DRAWN AEROBIC ONLY Blood Culture adequate volume   Culture   Final    NO GROWTH 2 DAYS Performed at [facility], [place] [address] [place], [place] [address]    Report Status PENDING  Incomplete  MRSA PCR Screening     Status: None   Collection Time: 08/16/18 10:47 AM   Specimen: Nasal Mucosa; Nasopharyngeal  Result Value Ref Range Status    MRSA by PCR NEGATIVE NEGATIVE Final    Comment:        The GeneXpert MRSA Assay (FDA approved for NASAL specimens only), is one component of a comprehensive MRSA colonization surveillance program. It is not intended to diagnose MRSA infection nor to guide or monitor treatment  for MRSA infections. Performed at [facility], [place] [address], [place], [place] [address]          Radiology Studies: US Renal  Result Date: 08/16/2018 CLINICAL DATA:  Acute renal failure. EXAM: RENAL / URINARY TRACT ULTRASOUND COMPLETE COMPARISON:  None. FINDINGS: Right Kidney: Renal measurements: 9.7 x 4.4 x 3.9 cm = volume: 88 mL . Echogenicity within normal limits. No mass or hydronephrosis visualized. Left Kidney: Renal measurements: 10.4 x 5.4 x 5.0 cm = volume: 47 mL. Echogenicity within normal limits. No mass or hydronephrosis visualized. Bladder: Appears normal for degree of bladder distention. Bilateral ureteral jets are noted. Calculated volume of 142 mL. IMPRESSION: Normal renal ultrasound. Electronically Signed   By: Marijo Conception M.D.   On: 08/16/2018 12:09        Scheduled Meds: . aspirin EC  81 mg Oral QHS  . carvedilol  25 mg Oral Daily  . famotidine  20 mg Oral BID  . furosemide  80 mg Oral BID  . heparin  5,000 Units Subcutaneous Q8H  . insulin aspart  0-9 Units Subcutaneous TID WC  . LORazepam  1 mg Intravenous On Call  . mometasone-formoterol  2 puff Inhalation BID  . montelukast  10 mg Oral QHS  .  morphine injection  2 mg Intravenous On Call  . OXcarbazepine  300 mg Oral BID  . oxyCODONE  2.5 mg Oral Once  . simvastatin  40 mg Oral QHS  . sodium chloride flush  3 mL Intravenous Q12H  . spironolactone  25 mg Oral Daily   Continuous Infusions: . sodium chloride 10 mL/hr at 08/16/18 0300     LOS: 1 day    Time spent: 36 minutes; personally reviewing all labs/imaging studies, discussion with nursing staff/consultants    Eric J [place] DO [facility]  Pager [phone]  If 7PM-7AM, please contact night-coverage [email or web address] Password [facility] 08/18/2018, 11:31 AM

## 2018-08-18 NOTE — Plan of Care (Signed)
  Problem: Elimination: Goal: Will not experience complications related to bowel motility Outcome: Progressing   Problem: Skin Integrity: Goal: Risk for impaired skin integrity will decrease Outcome: Progressing   

## 2018-08-19 ENCOUNTER — Other Ambulatory Visit: Payer: Medicare Other

## 2018-08-19 LAB — ALLERGENS W/TOTAL IGE AREA 2
Alternaria Alternata IgE: <0.1 kU/L
Aspergillus Fumigatus IgE: <0.1 kU/L
Bermuda Grass IgE: <0.1 kU/L
Cat Dander IgE: <0.1 kU/L
Cedar, Mountain IgE: <0.1 kU/L
Cladosporium Herbarum IgE: <0.1 kU/L
Cockroach, German IgE: 0.19 kU/L — AB
Common Silver Birch IgE: <0.1 kU/L
Cottonwood IgE: <0.1 kU/L
D Farinae IgE: 0.35 kU/L — AB
D Pteronyssinus IgE: 0.39 kU/L — AB
Dog Dander IgE: <0.1 kU/L
Elm, American IgE: <0.1 kU/L
IgE (Immunoglobulin E), Serum: 324 IU/mL (ref 6–495)
Johnson Grass IgE: <0.1 kU/L
Maple/Box Elder IgE: <0.1 kU/L
Mouse Urine IgE: <0.1 kU/L
Oak, White IgE: <0.1 kU/L
Pecan, Hickory IgE: <0.1 kU/L
Penicillium Chrysogen IgE: <0.1 kU/L
Pigweed, Rough IgE: <0.1 kU/L
Ragweed, Short IgE: <0.1 kU/L
Sheep Sorrel IgE Qn: <0.1 kU/L
Timothy Grass IgE: <0.1 kU/L
White Mulberry IgE: <0.1 kU/L

## 2018-08-19 LAB — GLUCOSE, CAPILLARY
Glucose-Capillary: 107 mg/dL — ABNORMAL HIGH (ref 70–99)
Glucose-Capillary: 171 mg/dL — ABNORMAL HIGH (ref 70–99)
Glucose-Capillary: 95 mg/dL (ref 70–99)
Glucose-Capillary: 174 mg/dL — ABNORMAL HIGH (ref 70–99)

## 2018-08-19 LAB — BASIC METABOLIC PANEL
Anion gap: 10 (ref 5–15)
BUN: 23 mg/dL (ref 8–23)
CO2: 28 mmol/L (ref 22–32)
Calcium: 8.5 mg/dL — ABNORMAL LOW (ref 8.9–10.3)
Chloride: 98 mmol/L (ref 98–111)
Creatinine, Ser: 1.92 mg/dL — ABNORMAL HIGH (ref 0.44–1.00)
GFR calc Af Amer: 30 mL/min — ABNORMAL LOW (ref >60)
GFR calc non Af Amer: 26 mL/min — ABNORMAL LOW (ref >60)
Glucose, Bld: 132 mg/dL — ABNORMAL HIGH (ref 70–99)
Potassium: 3.6 mmol/L (ref 3.5–5.1)
Sodium: 136 mmol/L (ref 135–145)

## 2018-08-19 LAB — ALLERGEN, TOMATO F25: Allergen Tomato, IgE: <0.1 kU/L

## 2018-08-19 LAB — ALPHA-GAL PANEL
Alpha Gal IgE*: <0.1 kU/L (ref <0.10)
Beef (Bos spp) IgE: <0.1 kU/L (ref <0.35)
Class Interpretation: 0
Class Interpretation: 0
Class Interpretation: 0
Lamb/Mutton (Ovis spp) IgE: <0.1 kU/L (ref <0.35)
Pork (Sus spp) IgE: <0.1 kU/L (ref <0.35)

## 2018-08-19 LAB — THYROID ANTIBODIES
Thyroglobulin Antibody: <1 IU/mL (ref 0.0–0.9)
Thyroperoxidase Ab SerPl-aCnc: 10 IU/mL (ref 0–34)

## 2018-08-19 LAB — ALLERGEN PROFILE, SHELLFISH
Clam IgE: <0.1 kU/L
F023-IgE Crab: 0.13 kU/L — AB
F080-IgE Lobster: <0.1 kU/L
F290-IgE Oyster: <0.1 kU/L
Scallop IgE: <0.1 kU/L
Shrimp IgE: 0.38 kU/L — AB

## 2018-08-19 LAB — ANA W/REFLEX: Anti Nuclear Antibody (ANA): NEGATIVE

## 2018-08-19 LAB — TRYPTASE: Tryptase: 5.7 ug/L (ref 2.2–13.2)

## 2018-08-19 LAB — SEDIMENTATION RATE: Sed Rate: 42 mm/hr — ABNORMAL HIGH (ref 0–40)

## 2018-08-19 LAB — CHRONIC URTICARIA: cu index: 6.1 (ref <10)

## 2018-08-19 NOTE — Plan of Care (Signed)
  Problem: Clinical Measurements: Goal: Will remain free from infection Outcome: Progressing   Problem: Activity: Goal: Risk for activity intolerance will decrease Outcome: Progressing   Problem: Pain Managment: Goal: General experience of comfort will improve Outcome: Progressing   Problem: Skin Integrity: Goal: Risk for impaired skin integrity will decrease Outcome: Progressing   

## 2018-08-19 NOTE — Progress Notes (Signed)
Inpatient Rehabilitation Admissions Coordinator  Inpatient rehab consult received. I met with patient at bedside for rehab assessment. I discussed basic goals and expectations of an inpt rehab admission. Stressed that currently she has not demonstrated the ability to be able to tolerate the intensity required to get insurance approval for an inpt rehab admission. I will follow up Monday with her progress to reassess her capabilities. Both PT and OT currently recommending SNF.   , RN, MSN Rehab Admissions Coordinator [phone] 08/19/2018 12:25 PM  

## 2018-08-19 NOTE — Progress Notes (Signed)
PROGRESS NOTE    April [name]  UVO:[id number] DOB: 05/30/1950 DOA: 08/15/2018 PCP: [name], MD    Brief Narrative:   April [name] is an 68 y.o. female with past medical history significant for morbid obesity, chronic right lower extremity wound, diabetes mellitus complicated by severe neuropathy, chronic kidney disease, hypertension who was in her usual state of health until 4 days prior to admission when she had onset of the "stinging, electric shocks, needles and stabbing pain" in her legs bilaterally.  Patient states that she has had difficulty with neuropathy in the past and has been on multiple different medications for it and is not sure what she is on for it at present.  Note she has had similar symptoms in the past however "it has been quite a while."  Patient contacted her neurologist and her PCP both of whom told her to come to the ED to be evaluated for an infection.  Patient also contacted Dr. [name] who manages her chronic lower extremity ulcer and he noted that he would see her today however she decided to come to the ED instead.  Patient denies any fevers or chills.  Patient thinks that her biggest problem is difficulty ambulating due to the severe pain she has had for the past 4 days.  She notes that at baseline she is able to walk a few steps to a bedside commode however spends most of the day sitting at her kitchen table or in a recliner.  She notes that her legs stay wrapped and she has not had any increased pain in her leg other than the very specific pain that developed suddenly in both of her legs 4 days ago.  Patient denies missing any of her medications.   Assessment & Plan:   Principal Problem:   Acute on chronic kidney failure (HCC) Active Problems:   Body mass index 60.0-69.9, adult (HCC)   Essential hypertension   Chronic diastolic heart failure (HCC)   Bilateral leg edema   Diabetes mellitus with neurological manifestations (HCC)   CKD (chronic  kidney disease) stage 3, GFR 30-59 ml/min (HCC)   Diabetic peripheral neuropathy (HCC)   Idiopathic chronic venous hypertension of right lower extremity with ulcer and inflammation (HCC)   Intractable pain   68 year old female with morbid obesity, severe venous hypertension with chronic right lower extremity wound that is actively being followed by Dr. [name] presents with what sounds like sudden worsening of patient's baseline diabetic neuropathy.  No evidence of infection on examination and patient has no leukocytosis or left shift.  Also noted to have subacute worsening of her kidney function over the past 2 months.   Acute on chronic kidney disease stage III Patient reports follows with nephrology outpatient, Dr. [name].  GFR 48 May 20 with a creatinine of 1.32, now declined to 23 on admission with a creatine of 2.45.  Was previously on furosemide 80 mg p.o. twice daily at time of last discharge, currently on 120 mg p.o. twice daily; question if this is secondary to excessive diuresis and dehydration.  Urinalysis unrevealing.  Urine sodium 46, urine creatinine 75.02; w/ FeNa calculated at 1.1% which is consistent with intrinsic cause such as AIN versus ATN. --Net negative 3.2L past 24h and negative 4.8L since admission --Lasix reduced to 80 mg p.o. twice daily --Cr improved 2.45-->2.03-->2.06-->1.92 --Renal ultrasound without acute findings; no hydronephrosis --Renally dose all medications, avoid nephrotoxins --Strict I's and O's --Daily weights --Follow renal function daily  Severe diabetic  neuropathy Patient clearly has very significant neuropathy and is followed for a long time by Dr. [name] of neurology. She has clearly been tried on multiple different neuropathic medications.  --oxcarbazepine increased to 300mg  PO BID on 7/16 --Robaxin 500mg  PO q6h prn --PT/OT recommends SNF --Patient desires CIR; consult placed  --social work for coordination  Chronic right lower extremity ulcer  Follows with orthopedics, Dr. [name]. Looks clean without evidence of infection.  Patient is afebrile without leukocytosis.  Patient received dose of vancomycin ordered by EDP, no need for further antibiotics at this time as no clear acute infectious etiology currently present. --Blood cultures x2: NG x 2 days --Bilateral duplex ultrasound negative for DVT --Check MR w/o contrast bilateral feet given acute on chronic pain and inability to walk to assess soft tissue to rule out possible underlying infection.  Attempts at MRI x2 with preprocedure Ativan/morphine unsuccessful as patient states cannot have her feet in those "boots" for MRI as this causes too much pain.  So will discontinue MR. --Continue wound care and outpatient follow-up; to be seen in orthopedics office, Dr. [name] 1 week  DM2  Diet controlled.  Hemoglobin A1c 6.4 on 07/15/2018 --Continue CBGs qAC/HS --Insulin sliding scale for coverage  Essential hypertension --Continue carvedilol and spironolactone --Reduced dose of Lasix to 80 mg p.o.BID; dose she was on upon last discharge  HFpEF Patient appears to be compensated. --Continue carvedilol and spironolactone  COPD No evidence for acute flare --continue montelukast, Dulera and as needed albuterol MDI  Severe morbid obesity BMI 60.  Discussed with patient need for aggressive lifestyle changes and weight loss as this complicates all facets of care; in which this is likely the leading etiology of most of her chronic comorbidities as above.  Ethics/Social: Patient continues to appear comfortable in bed, but any sort manipulation of her legs/feet causes extreme discomfort, although no other objective findings of poorly controlled pain such as tachycardia appreciated during hospitalization.  Patient reports that her 51 year old father helps her with transportation and ADLs at home; which is highly concerning was discussed with patient extensively today.  Patient requests that she  be evaluated by inpatient rehab, which the consult is currently pending otherwise she will not agree to SNF placement and desires to return home if unable to obtain CIR placement.  DVT prophylaxis: Lovenox Code Status: Full code Family Communication: None Disposition Plan: Inpatient, PT/OT recommends SNF, CIR evaluation pending, social work for coordination   Consultants:   None  Procedures:  None  Antimicrobials:   Vancomycin 7/13 - 7/13   Subjective:  Patient seen and examined at bedside, lying comfortably in bed.  Patient was unable to complete MRI once again today secondary to severe pain and reported distress.  Discussed with patient yesterday and today that if she wants evaluation of her bilateral lower extremities/feet, she needs to proceed with the MRI, although once again unsuccessful today; so will discontinue at future attempts. No other complaints at this time.  Denies headache, no chest pain, no palpitations, no shortness of breath, no abdominal pain.  No acute events overnight per nursing staff.  Objective: Vitals:   08/19/18 0320 08/19/18 0755 08/19/18 0932 08/19/18 0935  BP: (!) 99/51 (!) 116/55    Pulse: 79 83    Resp: 14 16    Temp: 98 F (36.7 C) 98.8 F (37.1 C)    TempSrc: Oral Oral    SpO2: 96% 96% (!) 84% 93%  Weight:      Height:  Intake/Output Summary (Last 24 hours) at 08/19/2018 1042 Last data filed at 08/19/2018 0900 Gross per 24 hour  Intake 2160 ml  Output 1500 ml  Net 660 ml   Filed Weights   08/15/18 1158 08/15/18 1736  Weight: (!) 158.3 kg (!) 158 kg    Examination:  General exam: Appears calm and comfortable, morbidly obese Respiratory system: Clear to auscultation. Respiratory effort normal. Cardiovascular system: S1 & S2 heard, RRR. No JVD, murmurs, rubs, gallops or clicks. No pedal edema. Gastrointestinal system: Abdomen is nondistended, protuberant, soft and nontender. No organomegaly or masses felt. Normal bowel  sounds heard. Central nervous system: Alert and oriented. No focal neurological deficits. Extremities: Symmetric 5 x 5 power. Skin: Large ulcer noted right anterior leg without purulence or evidence of infection.  Tender to palpation diffusely bilateral lower extremities from mid shin to plantar surfaces of feet.  Edema minimal bilateral lower extremities from just below knee to ankle with appearance of likely prior Unna boot placement.  Significant edema/adipose tissue noted proximal bilateral knees. Psychiatry: Judgement and insight appear normal. Mood & affect appropriate.     Data Reviewed: I have personally reviewed following labs and imaging studies  CBC: Recent Labs  Lab 08/15/18 1019 08/16/18 0252  WBC 5.2 5.7  NEUTROABS 3.1  --   HGB 10.4* 9.9*  HCT 34.0* 31.9*  MCV 93.9 93.3  PLT 172 962   Basic Metabolic Panel: Recent Labs  Lab 08/15/18 1019 08/15/18 1051 08/16/18 0252 08/17/18 0803 08/18/18 0346 08/19/18 0415  NA 140  --  140 141 139 136  K 4.5  --  4.0 3.6 3.7 3.6  CL 100  --  102 102 101 98  CO2 30  --  28 32 29 28  GLUCOSE 171*  --  141* 109* 105* 132*  BUN 32*  --  28* 26* 24* 23  CREATININE 2.45* 2.40* 2.03* 2.06* 1.92* 1.92*  CALCIUM 9.1  --  8.5* 8.3* 8.3* 8.5*  MG  --   --   --   --  2.1  --    GFR: Estimated Creatinine Clearance: 42.5 mL/min (A) (by C-G formula based on SCr of 1.92 mg/dL (H)). Liver Function Tests: Recent Labs  Lab 08/15/18 1019  AST 20  ALT 15  ALKPHOS 74  BILITOT 0.6  PROT 6.5  ALBUMIN 3.3*   No results for input(s): LIPASE, AMYLASE in the last 168 hours. No results for input(s): AMMONIA in the last 168 hours. Coagulation Profile: No results for input(s): INR, PROTIME in the last 168 hours. Cardiac Enzymes: No results for input(s): CKTOTAL, CKMB, CKMBINDEX, TROPONINI in the last 168 hours. BNP (last 3 results) No results for input(s): PROBNP in the last 8760 hours. HbA1C: No results for input(s): HGBA1C in the last  72 hours. CBG: Recent Labs  Lab 08/18/18 0745 08/18/18 1118 08/18/18 1701 08/18/18 2111 08/19/18 0753  GLUCAP 109* 141* 152* 131* 107*   Lipid Profile: No results for input(s): CHOL, HDL, LDLCALC, TRIG, CHOLHDL, LDLDIRECT in the last 72 hours. Thyroid Function Tests: No results for input(s): TSH, T4TOTAL, FREET4, T3FREE, THYROIDAB in the last 72 hours. Anemia Panel: No results for input(s): VITAMINB12, FOLATE, FERRITIN, TIBC, IRON, RETICCTPCT in the last 72 hours. Sepsis Labs: Recent Labs  Lab 08/15/18 1124 08/15/18 1830  LATICACIDVEN 1.0 1.5    Recent Results (from the past 240 hour(s))  Culture, blood (routine x 2)     Status: None (Preliminary result)   Collection Time: 08/15/18 12:03 PM  Specimen: BLOOD  Result Value Ref Range Status   Specimen Description BLOOD RIGHT ANTECUBITAL  Final   Special Requests   Final    BOTTLES DRAWN AEROBIC AND ANAEROBIC Blood Culture adequate volume   Culture   Final    NO GROWTH 3 DAYS Performed at [facility], [address], [place], [place] [address]    Report Status PENDING  Incomplete  SARS Coronavirus 2 (CEPHEID - Performed in [place] hospital lab), Hosp Order     Status: None   Collection Time: 08/15/18  4:55 PM   Specimen: Nasopharyngeal Swab  Result Value Ref Range Status   SARS Coronavirus 2 NEGATIVE NEGATIVE Final    Comment: (NOTE) If result is NEGATIVE SARS-CoV-2 target nucleic acids are NOT DETECTED. The SARS-CoV-2 RNA is generally detectable in upper and lower  respiratory specimens during the acute phase of infection. The lowest  concentration of SARS-CoV-2 viral copies this assay can detect is 250  copies / mL. A negative result does not preclude SARS-CoV-2 infection  and should not be used as the sole basis for treatment or other  patient management decisions.  A negative result may occur with  improper specimen collection / handling, submission of specimen other  than nasopharyngeal swab, presence  of viral mutation(s) within the  areas targeted by this assay, and inadequate number of viral copies  (<250 copies / mL). A negative result must be combined with clinical  observations, patient history, and epidemiological information. If result is POSITIVE SARS-CoV-2 target nucleic acids are DETECTED. The SARS-CoV-2 RNA is generally detectable in upper and lower  respiratory specimens dur ing the acute phase of infection.  Positive  results are indicative of active infection with SARS-CoV-2.  Clinical  correlation with patient history and other diagnostic information is  necessary to determine patient infection status.  Positive results do  not rule out bacterial infection or co-infection with other viruses. If result is PRESUMPTIVE POSTIVE SARS-CoV-2 nucleic acids MAY BE PRESENT.   A presumptive positive result was obtained on the submitted specimen  and confirmed on repeat testing.  While 2019 novel coronavirus  (SARS-CoV-2) nucleic acids may be present in the submitted sample  additional confirmatory testing may be necessary for epidemiological  and / or clinical management purposes  to differentiate between  SARS-CoV-2 and other Sarbecovirus currently known to infect humans.  If clinically indicated additional testing with an alternate test  methodology [phone]) is advised. The SARS-CoV-2 RNA is generally  detectable in upper and lower respiratory sp ecimens during the acute  phase of infection. The expected result is Negative. Fact Sheet for Patients:  StrictlyIdeas.no Fact Sheet for Healthcare Providers: BankingDealers.co.za This test is not yet approved or cleared by the [place] FDA and has been authorized for detection and/or diagnosis of SARS-CoV-2 by FDA under an Emergency Use Authorization (EUA).  This EUA will remain in effect (meaning this test can be used) for the duration of the COVID-19 declaration under Section  564(b)(1) of the Act, 21 U.S.C. section 360bbb-3(b)(1), unless the authorization is terminated or revoked sooner. Performed at [facility], [place] [address], [place], [place] [address]   Culture, blood (Routine X 2) w Reflex to ID Panel     Status: None (Preliminary result)   Collection Time: 08/15/18 10:29 PM   Specimen: BLOOD RIGHT HAND  Result Value Ref Range Status   Specimen Description BLOOD RIGHT HAND  Final   Special Requests   Final  BOTTLES DRAWN AEROBIC ONLY Blood Culture adequate volume   Culture   Final    NO GROWTH 3 DAYS Performed at [facility], [place] [address] [place], [place] [address]    Report Status PENDING  Incomplete  MRSA PCR Screening     Status: None   Collection Time: 08/16/18 10:47 AM   Specimen: Nasal Mucosa; Nasopharyngeal  Result Value Ref Range Status   MRSA by PCR NEGATIVE NEGATIVE Final    Comment:        The GeneXpert MRSA Assay (FDA approved for NASAL specimens only), is one component of a comprehensive MRSA colonization surveillance program. It is not intended to diagnose MRSA infection nor to guide or monitor treatment for MRSA infections. Performed at [facility], [place] [address], [place], [place] [address]          Radiology Studies: No results found.      Scheduled Meds: . aspirin EC  81 mg Oral QHS  . carvedilol  25 mg Oral Daily  . famotidine  20 mg Oral BID  . furosemide  80 mg Oral BID  . heparin  5,000 Units Subcutaneous Q8H  . insulin aspart  0-9 Units Subcutaneous TID WC  . mometasone-formoterol  2 puff Inhalation BID  . montelukast  10 mg Oral QHS  . OXcarbazepine  300 mg Oral BID  . oxyCODONE  2.5 mg Oral Once  . simvastatin  40 mg Oral QHS  . sodium chloride flush  3 mL Intravenous Q12H  . spironolactone  25 mg Oral Daily   Continuous Infusions: . sodium chloride 10 mL/hr at 08/16/18 0300     LOS: 2 days    Time spent: 39 minutes; personally reviewing all labs/imaging studies,  discussion with nursing staff/consultants    Adriyana Greenbaum J [place] DO [facility] Pager [phone]  If 7PM-7AM, please contact night-coverage [email or web address] Password [email or web address] 08/19/2018, 10:42 AM

## 2018-08-19 NOTE — Plan of Care (Signed)

## 2018-08-19 NOTE — Progress Notes (Signed)
Pt is updating her spouse. She does not need me to call him.

## 2018-08-19 NOTE — Progress Notes (Signed)
Physical Therapy Treatment Patient Details Name: April [name] MRN: [id number] DOB: 11/08/1950 Today's Date: 08/19/2018    History of Present Illness April [name] is an 68 y.o. female with past medical history significant for morbid obesity, chronic right lower extremity wound, diabetes mellitus complicated by severe neuropathy, chronic kidney disease, hypertension who was in her usual state of health until 4 days prior to admission when she had onset of the "stinging, electric shocks, needles and stabbing pain" in her legs bilaterally.    PT Comments    Pt very motivated to advance during PT session she is eager to get into rehab and based on progress will update recommendations to CIR at this time.  Pt is slow and guarded and remains limited due to pain.  She lives at home with support from son and husband who work during the day.  She would benefit from aggressive rehab to return to baseline MOD I to be able to toilet and ambulate short distance while home alone.  Currently she is performing bed mobs with supervision, transfers with moderate assistance and short gt trial with min assistance.  Will continue to follow patient to progress functional mobility.  Will inform supervising PT of need for change in recommendations at this time.   Follow Up Recommendations  Supervision/Assistance - 24 hour;CIR     Equipment Recommendations       Recommendations for Other Services       Precautions / Restrictions Precautions Precautions: Fall Precaution Comments: pt denies recent falls Restrictions Weight Bearing Restrictions: No    Mobility  Bed Mobility Overal bed mobility: Needs Assistance Bed Mobility: Supine to Sit     Supine to sit: Supervision     General bed mobility comments: Increased time and effort to elevate trunk into sitting and advance LEs to edge of bed.  Pt able to utilize railings and scoot to edge of bed.  She reports pain in back of thighs sitting edge of  bed.  Transfers Overall transfer level: Needs assistance Equipment used: Rolling walker (2 wheeled) Transfers: Sit to/from Stand Sit to Stand: Min assist         General transfer comment: Cues for hand placement, pt pushed from foot board with RUE and L hand placed on RW.  Pt required rocking momentum to achieve standing.  Pt flexed over device and leaning forward.  She required increased time to elevate chest while standing.  Ambulation/Gait Ambulation/Gait assistance: Min assist;+2 safety/equipment Gait Distance (Feet): 6 Feet Assistive device: Rolling walker (2 wheeled) Gait Pattern/deviations: Step-to pattern;Wide base of support;Shuffle;Decreased stride length     General Gait Details: Pt able to progress to short gt trial with close chair follow.  Pt required cues for advancement of gt training she remains limited due to pain.   Stairs             Wheelchair Mobility    Modified Rankin (Stroke Patients Only)       Balance Overall balance assessment: Needs assistance   Sitting balance-Leahy Scale: Fair       Standing balance-Leahy Scale: Poor                              Cognition Arousal/Alertness: Awake/alert Behavior During Therapy: WFL for tasks assessed/performed Overall Cognitive Status: Within Functional Limits for tasks assessed  Exercises      General Comments        Pertinent Vitals/Pain Pain Assessment: 0-10 Pain Score: 10-Worst pain ever Pain Location: feet Pain Descriptors / Indicators: Burning;Crying;Grimacing;Pressure;Radiating;Sharp;Shooting;Sore;Tender Pain Intervention(s): Monitored during session;Repositioned    Home Living                      Prior Function            PT Goals (current goals can now be found in the care plan section) Acute Rehab PT Goals Patient Stated Goal: I want to go home wtih family assist PT Goal Formulation: With  patient Potential to Achieve Goals: Fair    Frequency    Min 2X/week      PT Plan Current plan remains appropriate    Co-evaluation              AM-PAC PT "6 Clicks" Mobility   Outcome Measure  Help needed turning from your back to your side while in a flat bed without using bedrails?: A Little Help needed moving from lying on your back to sitting on the side of a flat bed without using bedrails?: A Little Help needed moving to and from a bed to a chair (including a wheelchair)?: A Lot Help needed standing up from a chair using your arms (e.g., wheelchair or bedside chair)?: A Lot Help needed to walk in hospital room?: A Little Help needed climbing 3-5 steps with a railing? : A Lot 6 Click Score: 15    End of Session Equipment Utilized During Treatment: Gait belt Activity Tolerance: Patient limited by pain Patient left: in chair;with call bell/phone within reach Nurse Communication: Mobility status PT Visit Diagnosis: Difficulty in walking, not elsewhere classified (R26.2);Pain Pain - Right/Left: Left Pain - part of body: Ankle and joints of foot     Time: 1213-1230 PT Time Calculation (min) (ACUTE ONLY): 17 min  Charges:  $Therapeutic Activity: 8-22 mins                     Governor Rooks, PTA Acute Rehabilitation Services Pager [phone] Office [phone]     April [name] 08/19/2018, 1:15 PM

## 2018-08-20 ENCOUNTER — Inpatient Hospital Stay (HOSPITAL_COMMUNITY): Payer: Medicare Other

## 2018-08-20 LAB — CULTURE, BLOOD (ROUTINE X 2)
Culture: NO GROWTH
Culture: NO GROWTH
Special Requests: ADEQUATE
Special Requests: ADEQUATE

## 2018-08-20 LAB — CBC
HCT: 30.3 % — ABNORMAL LOW (ref 36.0–46.0)
Hemoglobin: 9.3 g/dL — ABNORMAL LOW (ref 12.0–15.0)
MCH: 29 pg (ref 26.0–34.0)
MCHC: 30.7 g/dL (ref 30.0–36.0)
MCV: 94.4 fL (ref 80.0–100.0)
Platelets: 161 10*3/uL (ref 150–400)
RBC: 3.21 MIL/uL — ABNORMAL LOW (ref 3.87–5.11)
RDW: 16 % — ABNORMAL HIGH (ref 11.5–15.5)
WBC: 5.6 10*3/uL (ref 4.0–10.5)
nRBC: 0 % (ref 0.0–0.2)

## 2018-08-20 LAB — GLUCOSE, CAPILLARY
Glucose-Capillary: 107 mg/dL — ABNORMAL HIGH (ref 70–99)
Glucose-Capillary: 114 mg/dL — ABNORMAL HIGH (ref 70–99)
Glucose-Capillary: 147 mg/dL — ABNORMAL HIGH (ref 70–99)
Glucose-Capillary: 128 mg/dL — ABNORMAL HIGH (ref 70–99)

## 2018-08-20 LAB — BASIC METABOLIC PANEL
Anion gap: 10 (ref 5–15)
BUN: 23 mg/dL (ref 8–23)
CO2: 29 mmol/L (ref 22–32)
Calcium: 8.5 mg/dL — ABNORMAL LOW (ref 8.9–10.3)
Chloride: 99 mmol/L (ref 98–111)
Creatinine, Ser: 1.77 mg/dL — ABNORMAL HIGH (ref 0.44–1.00)
GFR calc Af Amer: 34 mL/min — ABNORMAL LOW (ref >60)
GFR calc non Af Amer: 29 mL/min — ABNORMAL LOW (ref >60)
Glucose, Bld: 94 mg/dL (ref 70–99)
Potassium: 3.7 mmol/L (ref 3.5–5.1)
Sodium: 138 mmol/L (ref 135–145)

## 2018-08-20 LAB — URIC ACID: Uric Acid, Serum: 14.8 mg/dL — ABNORMAL HIGH (ref 2.5–7.1)

## 2018-08-20 LAB — MAGNESIUM: Magnesium: 2.1 mg/dL (ref 1.7–2.4)

## 2018-08-20 NOTE — Progress Notes (Signed)
Physical Therapy Treatment Patient Details Name: April [name] MRN: [id number] DOB: 02/19/1950 Today's Date: 08/20/2018    History of Present Illness April [name] is an 68 y.o. female with past medical history significant for morbid obesity, chronic right lower extremity wound, diabetes mellitus complicated by severe neuropathy, chronic kidney disease, hypertension who was in her usual state of health until 4 days prior to admission when she had onset of the "stinging, electric shocks, needles and stabbing pain" in her legs bilaterally.    PT Comments    Pt is willing to push through pain to participate with therapy. She progressed OOB to recliner chair and required seated rest before attempting ambulation. Once up ambulation distance was limited to in room secondary to pain. Pt seems motivated and willing to progress. Patient would benefit from continued skilled PT to maximize functional independence and activity tolerance. Will continue to follow acutely.    Follow Up Recommendations  Supervision/Assistance - 24 hour;CIR     Equipment Recommendations  (pt will need bariatric lift equipment to get OOB into chair)    Recommendations for Other Services       Precautions / Restrictions Precautions Precautions: Fall Precaution Comments: pt denies recent falls Restrictions Weight Bearing Restrictions: No    Mobility  Bed Mobility Overal bed mobility: Needs Assistance Bed Mobility: Supine to Sit     Supine to sit: Supervision     General bed mobility comments: Pt requires increased time and effort with use of hand rails and HOB elevated to come to sitting EOB.  Transfers Overall transfer level: Needs assistance Equipment used: Rolling walker (2 wheeled) Transfers: Sit to/from Stand Sit to Stand: Mod assist;+2 physical assistance         General transfer comment: mod A +2 to power up from bed and recliner chair. Cues for hand placement. Mod A once standing to  steady and progress from flexed trunk to upright posture.   Ambulation/Gait Ambulation/Gait assistance: Min assist;+2 safety/equipment Gait Distance (Feet): 8 Feet Assistive device: Rolling walker (2 wheeled) Gait Pattern/deviations: Step-to pattern;Wide base of support;Shuffle;Decreased stride length     General Gait Details: Pt able to push through pain and ambulate short distance in room with min A for stability and chair to follow. Distance remains limited secondary to pain.   Stairs             Wheelchair Mobility    Modified Rankin (Stroke Patients Only)       Balance Overall balance assessment: Needs assistance   Sitting balance-Leahy Scale: Fair       Standing balance-Leahy Scale: Poor Standing balance comment: Heavy reliance on RW                            Cognition Arousal/Alertness: Awake/alert Behavior During Therapy: WFL for tasks assessed/performed Overall Cognitive Status: Within Functional Limits for tasks assessed                                 General Comments: Very determined to work through pain and progress      Exercises      General Comments        Pertinent Vitals/Pain Pain Assessment: 0-10 Pain Score: 9  Pain Location: BIL feet and hands Pain Descriptors / Indicators: Burning;Crying;Grimacing;Pressure;Radiating;Sharp;Shooting;Sore;Tender;Moaning Pain Intervention(s): Monitored during session;Limited activity within patient's tolerance;Patient requesting pain meds-RN notified;RN gave pain meds during session  Home Living                      Prior Function            PT Goals (current goals can now be found in the care plan section) Acute Rehab PT Goals Patient Stated Goal: I want to go home wtih family assist PT Goal Formulation: With patient Time For Goal Achievement: 08/16/18 Potential to Achieve Goals: Fair Progress towards PT goals: Progressing toward goals    Frequency     Min 2X/week      PT Plan Current plan remains appropriate    Co-evaluation              AM-PAC PT "6 Clicks" Mobility   Outcome Measure  Help needed turning from your back to your side while in a flat bed without using bedrails?: A Little Help needed moving from lying on your back to sitting on the side of a flat bed without using bedrails?: A Little Help needed moving to and from a bed to a chair (including a wheelchair)?: A Lot Help needed standing up from a chair using your arms (e.g., wheelchair or bedside chair)?: A Lot Help needed to walk in hospital room?: A Little Help needed climbing 3-5 steps with a railing? : A Lot 6 Click Score: 15    End of Session Equipment Utilized During Treatment: Gait belt Activity Tolerance: Patient limited by pain;Patient tolerated treatment well Patient left: in chair;with call bell/phone within reach Nurse Communication: Mobility status PT Visit Diagnosis: Difficulty in walking, not elsewhere classified (R26.2);Pain Pain - Right/Left: Left Pain - part of body: Ankle and joints of foot     Time: 1432-1453 PT Time Calculation (min) (ACUTE ONLY): 21 min  Charges:  $Therapeutic Activity: 8-22 mins                     Benjiman Core, [place] Pager [phone] Acute Rehab   Allena Katz 08/20/2018, 3:52 PM

## 2018-08-20 NOTE — Progress Notes (Signed)
PROGRESS NOTE    April [name]  XLK:[id number] DOB: 18-Jan-1951 DOA: 08/15/2018 PCP: [name], MD    Brief Narrative:   April [name] is an 68 y.o. female with past medical history significant for morbid obesity, chronic right lower extremity wound, diabetes mellitus complicated by severe neuropathy, chronic kidney disease, hypertension who was in her usual state of health until 4 days prior to admission when she had onset of the "stinging, electric shocks, needles and stabbing pain" in her legs bilaterally.  Patient states that she has had difficulty with neuropathy in the past and has been on multiple different medications for it and is not sure what she is on for it at present.  Note she has had similar symptoms in the past however "it has been quite a while."  Patient contacted her neurologist and her PCP both of whom told her to come to the ED to be evaluated for an infection.  Patient also contacted Dr. [name] who manages her chronic lower extremity ulcer and he noted that he would see her today however she decided to come to the ED instead.  Patient denies any fevers or chills.  Patient thinks that her biggest problem is difficulty ambulating due to the severe pain she has had for the past 4 days.  She notes that at baseline she is able to walk a few steps to a bedside commode however spends most of the day sitting at her kitchen table or in a recliner.  She notes that her legs stay wrapped and she has not had any increased pain in her leg other than the very specific pain that developed suddenly in both of her legs 4 days ago.  Patient denies missing any of her medications.   Subjective:   Patient in bed, appears comfortable, denies any headache, no fever, no chest pain or pressure, no shortness of breath , no abdominal pain. No focal weakness. Mild L. Ankle pain.  Assessment & Plan:    68 year old female with morbid obesity, severe venous hypertension with chronic right  lower extremity wound that is actively being followed by Dr. [name] presents with what sounds like sudden worsening of patient's baseline diabetic neuropathy.  No evidence of infection on examination and patient has no leukocytosis or left shift.  Also noted to have subacute worsening of her kidney function over the past 2 months.   Acute on chronic kidney disease stage III Patient reports follows with nephrology outpatient, Dr. [name].  GFR 48 May 20 with a creatinine of 1.32, now declined to 23 on admission with a creatine of 2.45.  Was previously on furosemide 80 mg p.o. twice daily at time of last discharge, currently on 120 mg p.o. twice daily; question if this is secondary to excessive diuresis and dehydration.  Urinalysis unrevealing.  Urine sodium 46, urine creatinine 75.02; w/ FeNa calculated at 1.1% which is consistent with intrinsic cause such as AIN versus ATN. --Lasix reduced to 80 mg p.o. twice daily --Cr improved 2.45-->2.03-->2.06-->1.9->1.7 --Renal ultrasound without acute findings; no hydronephrosis --Renally dose all medications, avoid nephrotoxins --Strict I's and O's --Daily weights --Follow renal function daily  Filed Weights   08/15/18 1158 08/15/18 1736  Weight: (!) 158.3 kg (!) 158 kg     Intake/Output Summary (Last 24 hours) at 08/20/2018 1449 Last data filed at 08/20/2018 1147 Gross per 24 hour  Intake 720 ml  Output 1200 ml  Net -480 ml    Severe diabetic neuropathy Patient clearly has very  significant neuropathy and is followed for a long time by Dr. [name] of neurology. She has clearly been tried on multiple different neuropathic medications.  --oxcarbazepine increased to 300mg  PO BID on 7/16 --Robaxin 500mg  PO q6h prn --PT/OT recommends SNF --Patient desires CIR; consult placed  --social work for coordination  Chronic right lower extremity ulcer Follows with orthopedics, Dr. [name]. Looks clean without evidence of infection.  Patient is afebrile without  leukocytosis.  Patient received dose of vancomycin ordered by EDP, no need for further antibiotics at this time as no clear acute infectious etiology currently present. --Blood cultures x2: NG x 2 days --Bilateral duplex ultrasound negative for DVT --Check MR w/o contrast bilateral feet given acute on chronic pain and inability to walk to assess soft tissue to rule out possible underlying infection.  Attempts at MRI x2 with preprocedure Ativan/morphine unsuccessful as patient states cannot have her feet in those "boots" for MRI as this causes too much pain.  So will discontinue MR. --Continue wound care and outpatient follow-up; to be seen in orthopedics office, Dr. [name] 1 week  DM2  Diet controlled.  Hemoglobin A1c 6.4 on 07/15/2018 --Continue CBGs qAC/HS --Insulin sliding scale for coverage  CBG (last 3)  Recent Labs    08/19/18 1952 08/20/18 0824 08/20/18 1147  GLUCAP 174* 107* 114*     Essential hypertension --Continue carvedilol and spironolactone --Reduced dose of Lasix to 80 mg p.o.BID; dose she was on upon last discharge  HFpEF Patient appears to be compensated. --Continue carvedilol and spironolactone  COPD No evidence for acute flare --continue montelukast, Dulera and as needed albuterol MDI  Severe morbid obesity BMI 60.  Discussed with patient need for aggressive lifestyle changes and weight loss as this complicates all facets of care; in which this is likely the leading etiology of most of her chronic comorbidities as above.  Left ankle pain.  Acute on chronic.  Check uric acid and x-ray.  Continue supportive care.  Exam is benign.    Ethics/Social: Patient continues to appear comfortable in bed, but any sort manipulation of her legs/feet causes extreme discomfort, although no other objective findings of poorly controlled pain such as tachycardia appreciated during hospitalization.  Patient reports that her 26 year old father helps her with transportation and  ADLs at home; which is highly concerning was discussed with patient extensively today.  Patient requests that she be evaluated by inpatient rehab, which the consult is currently pending otherwise she will not agree to SNF placement and desires to return home if unable to obtain CIR placement.  DVT prophylaxis: Lovenox Code Status: Full code Family Communication: None Disposition Plan: Inpatient, PT/OT recommends SNF, CIR evaluation pending, social work for coordination   Consultants:   None  Procedures:  None  Antimicrobials:   Vancomycin 7/13 - 7/13    Objective: Vitals:   08/19/18 1952 08/20/18 0412 08/20/18 0808 08/20/18 0828  BP: (!) 107/51 120/68  (!) 109/54  Pulse: 94 86  91  Resp: 16 14  14   Temp: 99.5 F (37.5 C) 98.9 F (37.2 C)  98.7 F (37.1 C)  TempSrc: Oral Oral  Oral  SpO2: 94% 96% 94% 100%  Weight:      Height:        Intake/Output Summary (Last 24 hours) at 08/20/2018 1444 Last data filed at 08/20/2018 1147 Gross per 24 hour  Intake 720 ml  Output 1200 ml  Net -480 ml   Filed Weights   08/15/18 1158 08/15/18 1736  Weight: (!) 158.3  kg (!) 158 kg    Examination:  Awake Alert, Oriented X 3, No new F.N deficits, Normal affect [place].AT,PERRAL Supple Neck,No JVD, No cervical lymphadenopathy appriciated.  Symmetrical Chest wall movement, Good air movement bilaterally, CTAB RRR,No Gallops, Rubs or new Murmurs, No Parasternal Heave +ve B.Sounds, Abd Soft, No tenderness, No organomegaly appriciated, No rebound - guarding or rigidity. No Cyanosis, Clubbing or edema, No new Rash or bruise   Data Reviewed: I have personally reviewed following labs and imaging studies  CBC: Recent Labs  Lab 08/15/18 1019 08/16/18 0252 08/20/18 0512  WBC 5.2 5.7 5.6  NEUTROABS 3.1  --   --   HGB 10.4* 9.9* 9.3*  HCT 34.0* 31.9* 30.3*  MCV 93.9 93.3 94.4  PLT 172 157 379   Basic Metabolic Panel: Recent Labs  Lab 08/16/18 0252 08/17/18 0803 08/18/18 0346  08/19/18 0415 08/20/18 0512  NA 140 141 139 136 138  K 4.0 3.6 3.7 3.6 3.7  CL 102 102 101 98 99  CO2 28 32 29 28 29   GLUCOSE 141* 109* 105* 132* 94  BUN 28* 26* 24* 23 23  CREATININE 2.03* 2.06* 1.92* 1.92* 1.77*  CALCIUM 8.5* 8.3* 8.3* 8.5* 8.5*  MG  --   --  2.1  --  2.1   GFR: Estimated Creatinine Clearance: 46.1 mL/min (A) (by C-G formula based on SCr of 1.77 mg/dL (H)). Liver Function Tests: Recent Labs  Lab 08/15/18 1019  AST 20  ALT 15  ALKPHOS 74  BILITOT 0.6  PROT 6.5  ALBUMIN 3.3*   No results for input(s): LIPASE, AMYLASE in the last 168 hours. No results for input(s): AMMONIA in the last 168 hours. Coagulation Profile: No results for input(s): INR, PROTIME in the last 168 hours. Cardiac Enzymes: No results for input(s): CKTOTAL, CKMB, CKMBINDEX, TROPONINI in the last 168 hours. BNP (last 3 results) No results for input(s): PROBNP in the last 8760 hours. HbA1C: No results for input(s): HGBA1C in the last 72 hours. CBG: Recent Labs  Lab 08/19/18 1115 08/19/18 1648 08/19/18 1952 08/20/18 0824 08/20/18 1147  GLUCAP 171* 95 174* 107* 114*   Lipid Profile: No results for input(s): CHOL, HDL, LDLCALC, TRIG, CHOLHDL, LDLDIRECT in the last 72 hours. Thyroid Function Tests: No results for input(s): TSH, T4TOTAL, FREET4, T3FREE, THYROIDAB in the last 72 hours. Anemia Panel: No results for input(s): VITAMINB12, FOLATE, FERRITIN, TIBC, IRON, RETICCTPCT in the last 72 hours. Sepsis Labs: Recent Labs  Lab 08/15/18 1124 08/15/18 1830  LATICACIDVEN 1.0 1.5    Recent Results (from the past 240 hour(s))  Culture, blood (routine x 2)     Status: None   Collection Time: 08/15/18 12:03 PM   Specimen: BLOOD  Result Value Ref Range Status   Specimen Description BLOOD RIGHT ANTECUBITAL  Final   Special Requests   Final    BOTTLES DRAWN AEROBIC AND ANAEROBIC Blood Culture adequate volume   Culture   Final    NO GROWTH 5 DAYS Performed at [facility], [address] [place], [place] [address]    Report Status 08/20/2018 FINAL  Final  SARS Coronavirus 2 (CEPHEID - Performed in [place] hospital lab), Hosp Order     Status: None   Collection Time: 08/15/18  4:55 PM   Specimen: Nasopharyngeal Swab  Result Value Ref Range Status   SARS Coronavirus 2 NEGATIVE NEGATIVE Final    Comment: (NOTE) If result is NEGATIVE SARS-CoV-2 target nucleic acids are NOT DETECTED. The SARS-CoV-2 RNA  is generally detectable in upper and lower  respiratory specimens during the acute phase of infection. The lowest  concentration of SARS-CoV-2 viral copies this assay can detect is 250  copies / mL. A negative result does not preclude SARS-CoV-2 infection  and should not be used as the sole basis for treatment or other  patient management decisions.  A negative result may occur with  improper specimen collection / handling, submission of specimen other  than nasopharyngeal swab, presence of viral mutation(s) within the  areas targeted by this assay, and inadequate number of viral copies  (<250 copies / mL). A negative result must be combined with clinical  observations, patient history, and epidemiological information. If result is POSITIVE SARS-CoV-2 target nucleic acids are DETECTED. The SARS-CoV-2 RNA is generally detectable in upper and lower  respiratory specimens dur ing the acute phase of infection.  Positive  results are indicative of active infection with SARS-CoV-2.  Clinical  correlation with patient history and other diagnostic information is  necessary to determine patient infection status.  Positive results do  not rule out bacterial infection or co-infection with other viruses. If result is PRESUMPTIVE POSTIVE SARS-CoV-2 nucleic acids MAY BE PRESENT.   A presumptive positive result was obtained on the submitted specimen  and confirmed on repeat testing.  While 2019 novel coronavirus  (SARS-CoV-2) nucleic acids may be present in the  submitted sample  additional confirmatory testing may be necessary for epidemiological  and / or clinical management purposes  to differentiate between  SARS-CoV-2 and other Sarbecovirus currently known to infect humans.  If clinically indicated additional testing with an alternate test  methodology [phone]) is advised. The SARS-CoV-2 RNA is generally  detectable in upper and lower respiratory sp ecimens during the acute  phase of infection. The expected result is Negative. Fact Sheet for Patients:  StrictlyIdeas.no Fact Sheet for Healthcare Providers: BankingDealers.co.za This test is not yet approved or cleared by the [place] FDA and has been authorized for detection and/or diagnosis of SARS-CoV-2 by FDA under an Emergency Use Authorization (EUA).  This EUA will remain in effect (meaning this test can be used) for the duration of the COVID-19 declaration under Section 564(b)(1) of the Act, 21 U.S.C. section 360bbb-3(b)(1), unless the authorization is terminated or revoked sooner. Performed at [facility], [place] [address], [place], [place] [address]   Culture, blood (Routine X 2) w Reflex to ID Panel     Status: None   Collection Time: 08/15/18 10:29 PM   Specimen: BLOOD RIGHT HAND  Result Value Ref Range Status   Specimen Description BLOOD RIGHT HAND  Final   Special Requests   Final    BOTTLES DRAWN AEROBIC ONLY Blood Culture adequate volume   Culture   Final    NO GROWTH 5 DAYS Performed at [facility], [place] [address] [place], [place] [address]    Report Status 08/20/2018 FINAL  Final  MRSA PCR Screening     Status: None   Collection Time: 08/16/18 10:47 AM   Specimen: Nasal Mucosa; Nasopharyngeal  Result Value Ref Range Status   MRSA by PCR NEGATIVE NEGATIVE Final    Comment:        The GeneXpert MRSA Assay (FDA approved for NASAL specimens only), is one component of a comprehensive MRSA colonization  surveillance program. It is not intended to diagnose MRSA infection nor to guide or monitor treatment for MRSA infections. Performed at [facility], [place] [address],  [place], [place] [address]     Radiology Studies: No results found.  Scheduled Meds: . aspirin EC  81 mg Oral QHS  . carvedilol  25 mg Oral Daily  . famotidine  20 mg Oral BID  . furosemide  80 mg Oral BID  . heparin  5,000 Units Subcutaneous Q8H  . insulin aspart  0-9 Units Subcutaneous TID WC  . mometasone-formoterol  2 puff Inhalation BID  . montelukast  10 mg Oral QHS  . OXcarbazepine  300 mg Oral BID  . oxyCODONE  2.5 mg Oral Once  . simvastatin  40 mg Oral QHS  . sodium chloride flush  3 mL Intravenous Q12H  . spironolactone  25 mg Oral Daily   Continuous Infusions: . sodium chloride 10 mL/hr at 08/16/18 0300     LOS: 3 days    Time spent: 39 minutes; personally reviewing all labs/imaging studies, discussion with nursing staff/consultants  Signature  Lala Lund M.D on 08/20/2018 at 2:44 PM   -  To page go to [email or web address]

## 2018-08-20 NOTE — Plan of Care (Signed)

## 2018-08-21 LAB — GLUCOSE, CAPILLARY
Glucose-Capillary: 94 mg/dL (ref 70–99)
Glucose-Capillary: 216 mg/dL — ABNORMAL HIGH (ref 70–99)
Glucose-Capillary: 220 mg/dL — ABNORMAL HIGH (ref 70–99)
Glucose-Capillary: 205 mg/dL — ABNORMAL HIGH (ref 70–99)

## 2018-08-21 MED ORDER — COLCHICINE 0.6 MG PO TABS
0.6000 mg | ORAL_TABLET | Freq: Two times a day (BID) | ORAL | Status: AC
  Administered 2018-08-21 (×2): 0.6 mg via ORAL
  Filled 2018-08-21 (×2): qty 1

## 2018-08-21 MED ORDER — COLCHICINE 0.6 MG PO TABS
0.6000 mg | ORAL_TABLET | Freq: Every day | ORAL | Status: DC
  Administered 2018-08-23: 0.6 mg via ORAL
  Filled 2018-08-21: qty 1

## 2018-08-21 MED ORDER — METHYLPREDNISOLONE SODIUM SUCC 125 MG IJ SOLR
60.0000 mg | Freq: Once | INTRAMUSCULAR | Status: AC
  Administered 2018-08-21: 60 mg via INTRAVENOUS
  Filled 2018-08-21: qty 2

## 2018-08-21 NOTE — Progress Notes (Signed)
Physical Therapy Treatment Patient Details Name: Fanny Agan MRN: [id number] DOB: [birth year] Today's Date: 08/21/2018    History of Present Illness Mertie Page Shannon is an 68 y.o. female with past medical history significant for morbid obesity, chronic right lower extremity wound, diabetes mellitus complicated by severe neuropathy, chronic kidney disease, hypertension who was in her usual state of health until 4 days prior to admission when she had onset of the "stinging, electric shocks, needles and stabbing pain" in her legs bilaterally.    PT Comments    Continuing work on functional mobility and activity tolerance;  Very motivated despite pain, and able to double distance walked last session; I still believe it is worth considering CIR   Follow Up Recommendations  Supervision/Assistance - 24 hour;CIR     Equipment Recommendations  Rolling walker with 5" wheels;3in1 (PT)    Recommendations for Other Services       Precautions / Restrictions Precautions Precautions: Fall Restrictions Weight Bearing Restrictions: No    Mobility  Bed Mobility Overal bed mobility: Needs Assistance Bed Mobility: Supine to Sit     Supine to sit: Supervision     General bed mobility comments: Pt requires increased time and effort with use of hand rails and HOB elevated to come to sitting EOB.  Transfers Overall transfer level: Needs assistance Equipment used: Rolling walker (2 wheeled) Transfers: Sit to/from Stand Sit to Stand: Mod assist;+2 physical assistance         General transfer comment: mod A +2 to power up from bed and recliner chair. Cues for hand placement. Mod A once standing to steady and progress from flexed trunk to upright posture.   Ambulation/Gait Ambulation/Gait assistance: Min assist;+2 safety/equipment Gait Distance (Feet): 20 Feet Assistive device: Rolling walker (2 wheeled) Gait Pattern/deviations: Step-to pattern;Wide base of support;Shuffle;Decreased  stride length     General Gait Details: Pt able to push through pain and ambulate short distance in room with min A for stability and chair to follow. Distance remains limited secondary to pain.   Stairs             Wheelchair Mobility    Modified Rankin (Stroke Patients Only)       Balance     Sitting balance-Leahy Scale: Fair       Standing balance-Leahy Scale: Poor Standing balance comment: Heavy reliance on RW                            Cognition Arousal/Alertness: Awake/alert Behavior During Therapy: WFL for tasks assessed/performed Overall Cognitive Status: Within Functional Limits for tasks assessed                                 General Comments: Very determined to work through pain and progress      Exercises      General Comments        Pertinent Vitals/Pain Pain Assessment: 0-10 Pain Score: 8  Pain Location: BIL feet and hands Pain Descriptors / Indicators: Burning;Crying;Grimacing;Pressure;Radiating;Sharp;Shooting;Sore;Tender;Moaning Pain Intervention(s): Monitored during session    Home Living                      Prior Function            PT Goals (current goals can now be found in the care plan section) Acute Rehab PT Goals Patient Stated Goal: tells me she  is motivated to get to CIR PT Goal Formulation: With patient Time For Goal Achievement: 08/30/18 Potential to Achieve Goals: Fair Progress towards PT goals: Progressing toward goals(Added ambulation goal)    Frequency    Min 2X/week      PT Plan Current plan remains appropriate    Co-evaluation              AM-PAC PT "6 Clicks" Mobility   Outcome Measure  Help needed turning from your back to your side while in a flat bed without using bedrails?: A Little Help needed moving from lying on your back to sitting on the side of a flat bed without using bedrails?: A Little Help needed moving to and from a bed to a chair  (including a wheelchair)?: A Lot Help needed standing up from a chair using your arms (e.g., wheelchair or bedside chair)?: A Lot Help needed to walk in hospital room?: A Little Help needed climbing 3-5 steps with a railing? : A Lot 6 Click Score: 15    End of Session Equipment Utilized During Treatment: Gait belt Activity Tolerance: Patient tolerated treatment well Patient left: in chair;with call bell/phone within reach Nurse Communication: Mobility status PT Visit Diagnosis: Difficulty in walking, not elsewhere classified (R26.2);Pain Pain - Right/Left: Left Pain - part of body: Ankle and joints of foot     Time: 0940-1000 PT Time Calculation (min) (ACUTE ONLY): 20 min  Charges:  $Gait Training: 8-22 mins                     Roney Marion, [place]  Acute Rehabilitation Services Pager [phone] Office [place] 08/21/2018, 1:07 PM

## 2018-08-21 NOTE — Progress Notes (Signed)
PROGRESS NOTE    April [name]  HGD:[id number] DOB: Jan 27, 1951 DOA: 08/15/2018 PCP: [name], MD    Brief Narrative:   April [name] is an 68 y.o. female with past medical history significant for morbid obesity, chronic right lower extremity wound, diabetes mellitus complicated by severe neuropathy, chronic kidney disease, hypertension who was in her usual state of health until 4 days prior to admission when she had onset of the "stinging, electric shocks, needles and stabbing pain" in her legs bilaterally.  Patient states that she has had difficulty with neuropathy in the past and has been on multiple different medications for it and is not sure what she is on for it at present.  Note she has had similar symptoms in the past however "it has been quite a while."  Patient contacted her neurologist and her PCP both of whom told her to come to the ED to be evaluated for an infection.  Patient also contacted Dr. [name] who manages her chronic lower extremity ulcer and he noted that he would see her today however she decided to come to the ED instead.  Patient denies any fevers or chills.  Patient thinks that her biggest problem is difficulty ambulating due to the severe pain she has had for the past 4 days.  She notes that at baseline she is able to walk a few steps to a bedside commode however spends most of the day sitting at her kitchen table or in a recliner.  She notes that her legs stay wrapped and she has not had any increased pain in her leg other than the very specific pain that developed suddenly in both of her legs 4 days ago.  Patient denies missing any of her medications.   Subjective:  Patient in bed, appears comfortable, denies any headache, no fever, no chest pain or pressure, no shortness of breath , no abdominal pain. No focal weakness.. Mild L. Ankle pain.  Assessment & Plan:    68 year old female with morbid obesity, severe venous hypertension with chronic right  lower extremity wound that is actively being followed by Dr. [name] presents with what sounds like sudden worsening of patient's baseline diabetic neuropathy.  No evidence of infection on examination and patient has no leukocytosis or left shift.  Also noted to have subacute worsening of her kidney function over the past 2 months.   Acute on chronic kidney disease stage III Patient reports follows with nephrology outpatient, Dr. [name].  GFR 48 May 20 with a creatinine of 1.32, now GFR declined to 23 on admission with a creatine of 2.45.  Likely had some element of overdiuresis Lasix dose has been dropped to 80 twice daily by nephrology with improvement in renal function, now close to her baseline, renal ultrasound was stable, renal function now at or better than baseline of around 2.  Filed Weights   08/15/18 1158 08/15/18 1736  Weight: (!) 158.3 kg (!) 158 kg     Intake/Output Summary (Last 24 hours) at 08/21/2018 1050 Last data filed at 08/21/2018 0900 Gross per 24 hour  Intake 720 ml  Output 1100 ml  Net -380 ml    Severe diabetic neuropathy Patient clearly has very significant neuropathy and is followed for a long time by Dr. [name] of neurology. She has clearly been tried on multiple different neuropathic medications.  --oxcarbazepine increased to 300mg  PO BID on 7/16 --Robaxin 500mg  PO q6h prn --PT/OT recommends SNF --Patient desires CIR; consult placed  --social work  for coordination  Chronic right lower extremity ulcer Follows with orthopedics, Dr. [name]. Looks clean without evidence of infection.  Patient is afebrile without leukocytosis.  Patient received dose of vancomycin ordered by EDP, no need for further antibiotics at this time as no clear acute infectious etiology currently present. Blood cultures x2: NG x 2 days, lower extremity venous duplex negative.  Could not get MRI as patient was refused the procedure due to intolerance.  Continue wound care and outpatient follow-up with  Dr. [name] post discharge.  Signs of active infection.   DM2  Diet controlled.  Hemoglobin A1c 6.4 on 07/15/2018 --Continue CBGs qAC/HS --Insulin sliding scale for coverage  CBG (last 3)  Recent Labs    08/20/18 1611 08/20/18 2053 08/21/18 0747  GLUCAP 147* 128* 94     Essential hypertension - continue Coreg and diuretics.    HFpEF  Patient appears to be compensated. Continue carvedilol, Lasix and spironolactone  COPD  No evidence for acute flare, continue montelukast, Dulera and as needed albuterol MDI  Severe morbid obesity -  BMI 60.  Discussed with patient need for aggressive lifestyle changes and weight loss as this complicates all facets of care; in which this is likely the leading etiology of most of her chronic comorbidities as above.  Left ankle pain.  Likely due to acute gout.  Uric acid level was elevated above 10, given a dose of steroid on 08/21/2018 and placed on colchicine.  Will monitor.  X-ray stable of left ankle.    Ethics/Social: Patient continues to appear comfortable in bed, but any sort manipulation of her legs/feet causes extreme discomfort, although no other objective findings of poorly controlled pain such as tachycardia appreciated during hospitalization.  Patient reports that her 29 year old father helps her with transportation and ADLs at home; which is highly concerning was discussed with patient extensively today.  Patient requests that she be evaluated by inpatient rehab, which the consult is currently pending otherwise she will not agree to SNF placement and desires to return home if unable to obtain CIR placement.  DVT prophylaxis: Lovenox Code Status: Full code Family Communication: None Disposition Plan: Inpatient, PT/OT recommends SNF, CIR evaluation pending, medically stable for discharge on 08/20/2018.   Consultants:   None  Procedures:  None  Antimicrobials:   Vancomycin 7/13 - 7/13    Objective: Vitals:   08/20/18 2021  08/21/18 0315 08/21/18 0738 08/21/18 0748  BP: (!) 106/50 (!) 122/54  (!) 126/41  Pulse: 80 88  67  Resp: 18 16  15   Temp: 99.3 F (37.4 C) 98.6 F (37 C)  98.3 F (36.8 C)  TempSrc: Oral Oral  Oral  SpO2: 98% 98% 96% 94%  Weight:      Height:        Intake/Output Summary (Last 24 hours) at 08/21/2018 1050 Last data filed at 08/21/2018 0900 Gross per 24 hour  Intake 720 ml  Output 1100 ml  Net -380 ml   Filed Weights   08/15/18 1158 08/15/18 1736  Weight: (!) 158.3 kg (!) 158 kg    Examination:  Awake Alert, Oriented X 3, No new F.N deficits, Normal affect [place].AT,PERRAL Supple Neck,No JVD, No cervical lymphadenopathy appriciated.  Symmetrical Chest wall movement, Good air movement bilaterally, CTAB RRR,No Gallops, Rubs or new Murmurs, No Parasternal Heave +ve B.Sounds, Abd Soft, No tenderness, No organomegaly appriciated, No rebound - guarding or rigidity. No Cyanosis, Clubbing or edema, mild to moderate left ankle tenderness on palpation    Data  Reviewed: I have personally reviewed following labs and imaging studies  CBC: Recent Labs  Lab 08/15/18 1019 08/16/18 0252 08/20/18 0512  WBC 5.2 5.7 5.6  NEUTROABS 3.1  --   --   HGB 10.4* 9.9* 9.3*  HCT 34.0* 31.9* 30.3*  MCV 93.9 93.3 94.4  PLT 172 157 735   Basic Metabolic Panel: Recent Labs  Lab 08/16/18 0252 08/17/18 0803 08/18/18 0346 08/19/18 0415 08/20/18 0512  NA 140 141 139 136 138  K 4.0 3.6 3.7 3.6 3.7  CL 102 102 101 98 99  CO2 28 32 29 28 29   GLUCOSE 141* 109* 105* 132* 94  BUN 28* 26* 24* 23 23  CREATININE 2.03* 2.06* 1.92* 1.92* 1.77*  CALCIUM 8.5* 8.3* 8.3* 8.5* 8.5*  MG  --   --  2.1  --  2.1   GFR: Estimated Creatinine Clearance: 46.1 mL/min (A) (by C-G formula based on SCr of 1.77 mg/dL (H)). Liver Function Tests: Recent Labs  Lab 08/15/18 1019  AST 20  ALT 15  ALKPHOS 74  BILITOT 0.6  PROT 6.5  ALBUMIN 3.3*   No results for input(s): LIPASE, AMYLASE in the last 168  hours. No results for input(s): AMMONIA in the last 168 hours. Coagulation Profile: No results for input(s): INR, PROTIME in the last 168 hours. Cardiac Enzymes: No results for input(s): CKTOTAL, CKMB, CKMBINDEX, TROPONINI in the last 168 hours. BNP (last 3 results) No results for input(s): PROBNP in the last 8760 hours. HbA1C: No results for input(s): HGBA1C in the last 72 hours. CBG: Recent Labs  Lab 08/20/18 0824 08/20/18 1147 08/20/18 1611 08/20/18 2053 08/21/18 0747  GLUCAP 107* 114* 147* 128* 94   Lipid Profile: No results for input(s): CHOL, HDL, LDLCALC, TRIG, CHOLHDL, LDLDIRECT in the last 72 hours. Thyroid Function Tests: No results for input(s): TSH, T4TOTAL, FREET4, T3FREE, THYROIDAB in the last 72 hours. Anemia Panel: No results for input(s): VITAMINB12, FOLATE, FERRITIN, TIBC, IRON, RETICCTPCT in the last 72 hours. Sepsis Labs: Recent Labs  Lab 08/15/18 1124 08/15/18 1830  LATICACIDVEN 1.0 1.5    Recent Results (from the past 240 hour(s))  Culture, blood (routine x 2)     Status: None   Collection Time: 08/15/18 12:03 PM   Specimen: BLOOD  Result Value Ref Range Status   Specimen Description BLOOD RIGHT ANTECUBITAL  Final   Special Requests   Final    BOTTLES DRAWN AEROBIC AND ANAEROBIC Blood Culture adequate volume   Culture   Final    NO GROWTH 5 DAYS Performed at [facility], [address], [place], [place] [address]    Report Status 08/20/2018 FINAL  Final  SARS Coronavirus 2 (CEPHEID - Performed in [place] hospital lab), Hosp Order     Status: None   Collection Time: 08/15/18  4:55 PM   Specimen: Nasopharyngeal Swab  Result Value Ref Range Status   SARS Coronavirus 2 NEGATIVE NEGATIVE Final    Comment: (NOTE) If result is NEGATIVE SARS-CoV-2 target nucleic acids are NOT DETECTED. The SARS-CoV-2 RNA is generally detectable in upper and lower  respiratory specimens during the acute phase of infection. The lowest  concentration of  SARS-CoV-2 viral copies this assay can detect is 250  copies / mL. A negative result does not preclude SARS-CoV-2 infection  and should not be used as the sole basis for treatment or other  patient management decisions.  A negative result may occur with  improper specimen collection / handling, submission of  specimen other  than nasopharyngeal swab, presence of viral mutation(s) within the  areas targeted by this assay, and inadequate number of viral copies  (<250 copies / mL). A negative result must be combined with clinical  observations, patient history, and epidemiological information. If result is POSITIVE SARS-CoV-2 target nucleic acids are DETECTED. The SARS-CoV-2 RNA is generally detectable in upper and lower  respiratory specimens dur ing the acute phase of infection.  Positive  results are indicative of active infection with SARS-CoV-2.  Clinical  correlation with patient history and other diagnostic information is  necessary to determine patient infection status.  Positive results do  not rule out bacterial infection or co-infection with other viruses. If result is PRESUMPTIVE POSTIVE SARS-CoV-2 nucleic acids MAY BE PRESENT.   A presumptive positive result was obtained on the submitted specimen  and confirmed on repeat testing.  While 2019 novel coronavirus  (SARS-CoV-2) nucleic acids may be present in the submitted sample  additional confirmatory testing may be necessary for epidemiological  and / or clinical management purposes  to differentiate between  SARS-CoV-2 and other Sarbecovirus currently known to infect humans.  If clinically indicated additional testing with an alternate test  methodology [id number]) is advised. The SARS-CoV-2 RNA is generally  detectable in upper and lower respiratory sp ecimens during the acute  phase of infection. The expected result is Negative. Fact Sheet for Patients:  StrictlyIdeas.no Fact Sheet for Healthcare  Providers: BankingDealers.co.za This test is not yet approved or cleared by the [place] FDA and has been authorized for detection and/or diagnosis of SARS-CoV-2 by FDA under an Emergency Use Authorization (EUA).  This EUA will remain in effect (meaning this test can be used) for the duration of the COVID-19 declaration under Section 564(b)(1) of the Act, 21 U.S.C. section 360bbb-3(b)(1), unless the authorization is terminated or revoked sooner. Performed at [facility], [place] [address] [place], [place] [address]   Culture, blood (Routine X 2) w Reflex to ID Panel     Status: None   Collection Time: 08/15/18 10:29 PM   Specimen: BLOOD RIGHT HAND  Result Value Ref Range Status   Specimen Description BLOOD RIGHT HAND  Final   Special Requests   Final    BOTTLES DRAWN AEROBIC ONLY Blood Culture adequate volume   Culture   Final    NO GROWTH 5 DAYS Performed at [facility], [place] [address], [place], [place] [address]    Report Status 08/20/2018 FINAL  Final  MRSA PCR Screening     Status: None   Collection Time: 08/16/18 10:47 AM   Specimen: Nasal Mucosa; Nasopharyngeal  Result Value Ref Range Status   MRSA by PCR NEGATIVE NEGATIVE Final    Comment:        The GeneXpert MRSA Assay (FDA approved for NASAL specimens only), is one component of a comprehensive MRSA colonization surveillance program. It is not intended to diagnose MRSA infection nor to guide or monitor treatment for MRSA infections. Performed at [facility], [place] [address] [place], [place] [address]     Radiology Studies: Dg Ankle 2 Views Left  Result Date: 08/20/2018 CLINICAL DATA:  Acute left ankle pain without known injury. EXAM: LEFT ANKLE - 2 VIEW COMPARISON:  None. FINDINGS: There is no evidence of fracture, dislocation, or joint effusion. There is no evidence of arthropathy or other focal bone abnormality. Soft tissues are unremarkable. IMPRESSION: Negative.  Electronically Signed   By: Bobbe Medico.D.  On: 08/20/2018 15:27    Scheduled Meds:  aspirin EC  81 mg Oral QHS   carvedilol  25 mg Oral Daily   colchicine  0.6 mg Oral BID   [START ON 08/23/2018] colchicine  0.6 mg Oral Daily   famotidine  20 mg Oral BID   furosemide  80 mg Oral BID   heparin  5,000 Units Subcutaneous Q8H   insulin aspart  0-9 Units Subcutaneous TID WC   mometasone-formoterol  2 puff Inhalation BID   montelukast  10 mg Oral QHS   OXcarbazepine  300 mg Oral BID   oxyCODONE  2.5 mg Oral Once   simvastatin  40 mg Oral QHS   sodium chloride flush  3 mL Intravenous Q12H   spironolactone  25 mg Oral Daily   Continuous Infusions:  sodium chloride 10 mL/hr at 08/16/18 0300     LOS: 4 days    Time spent: 39 minutes; personally reviewing all labs/imaging studies, discussion with nursing staff/consultants  Signature  Lala Lund M.D on 08/21/2018 at 10:50 AM   -  To page go to [email or web address]

## 2018-08-21 NOTE — Plan of Care (Signed)
  Problem: Pain Managment: Goal: General experience of comfort will improve Outcome: Progressing   Problem: Safety: Goal: Ability to remain free from injury will improve Outcome: Progressing   Problem: Skin Integrity: Goal: Risk for impaired skin integrity will decrease Outcome: Progressing   

## 2018-08-22 ENCOUNTER — Encounter (HOSPITAL_COMMUNITY): Payer: Self-pay | Admitting: *Deleted

## 2018-08-22 DIAGNOSIS — Z6841 Body Mass Index (BMI) 40.0 and over, adult: Secondary | ICD-10-CM

## 2018-08-22 DIAGNOSIS — N179 Acute kidney failure, unspecified: Secondary | ICD-10-CM

## 2018-08-22 DIAGNOSIS — L97919 Non-pressure chronic ulcer of unspecified part of right lower leg with unspecified severity: Secondary | ICD-10-CM

## 2018-08-22 DIAGNOSIS — N184 Chronic kidney disease, stage 4 (severe): Secondary | ICD-10-CM

## 2018-08-22 DIAGNOSIS — R600 Localized edema: Secondary | ICD-10-CM

## 2018-08-22 DIAGNOSIS — I87331 Chronic venous hypertension (idiopathic) with ulcer and inflammation of right lower extremity: Secondary | ICD-10-CM

## 2018-08-22 LAB — GLUCOSE, CAPILLARY
Glucose-Capillary: 137 mg/dL — ABNORMAL HIGH (ref 70–99)
Glucose-Capillary: 184 mg/dL — ABNORMAL HIGH (ref 70–99)
Glucose-Capillary: 251 mg/dL — ABNORMAL HIGH (ref 70–99)
Glucose-Capillary: 177 mg/dL — ABNORMAL HIGH (ref 70–99)

## 2018-08-22 MED ORDER — OXYCODONE-ACETAMINOPHEN 5-325 MG PO TABS: 1.0000 | ORAL_TABLET | Freq: Four times a day (QID) | ORAL | 0 refills | Status: DC | PRN

## 2018-08-22 MED ORDER — METHYLPREDNISOLONE SODIUM SUCC 125 MG IJ SOLR
40.0000 mg | Freq: Once | INTRAMUSCULAR | Status: AC
  Administered 2018-08-22: 40 mg via INTRAVENOUS
  Filled 2018-08-22: qty 2

## 2018-08-22 MED ORDER — CLOBETASOL PROPIONATE 0.05 % EX CREA
TOPICAL_CREAM | Freq: Two times a day (BID) | CUTANEOUS | Status: DC | PRN
  Administered 2018-08-22: 1 via TOPICAL
  Filled 2018-08-22: qty 15

## 2018-08-22 NOTE — Consult Note (Signed)
ORTHOPAEDIC CONSULTATION  REQUESTING PHYSICIAN: Thurnell Lose, MD  Chief Complaint: Chronic ulceration right lower extremity.  HPI: April [name] is a 68 y.o. female who presents with chronic venous ulcer right lower extremity patient is status post multiple surgical interventions for ulcerations on both legs.  The left leg is healed she still has a persistent ulcer on the right leg.  Patient presented with increased edema with complaints of pain in the plantar aspect of both feet.  Past Medical History:  Diagnosis Date  . Anemia   . Asthma   . CAD (coronary artery disease)   . Carpal tunnel syndrome   . Cellulitis of both lower extremities 04/11/2015  . CHF (congestive heart failure) ([place])   . Chronic kidney disease    [facility]- followed by [facility]  . Colon polyp, hyperplastic 2007 & 2012  . Complication of anesthesia 1999   svt with renal calculi surgery, no problems since  . CTS (carpal tunnel syndrome)    bilateral  . Diabetes mellitus   . Eczema   . GERD (gastroesophageal reflux disease)   . History of kidney stones 1999  . Hyperlipidemia   . Hypertension   . Leg ulcer ([place]) 04/24/2015   Right lateral leg No evidence of an infection Monitor closely Keep edema controlled   . Leg ulcer ([place])    right lower  . Meralgia paresthetica    Dr. [name]  . Morbid obesity ([place])   . Morbid obesity ([place])   . Neuropathy    toes and legs  . Osteoarthrosis, unspecified whether generalized or localized, lower leg    knee  . PUD (peptic ulcer disease)   . Shortness of breath dyspnea    with exertion  . Sleep apnea    per progress note 02/25/2018  . Type II or unspecified type diabetes mellitus without mention of complication, not stated as uncontrolled   . Unspecified hereditary and idiopathic peripheral neuropathy   . Urticaria   . Vitamin B12 deficiency   . Wears glasses   . Wound cellulitis    right upper leg, healing well   Past Surgical History:  Procedure  Laterality Date  . ABDOMINAL HYSTERECTOMY    . CARDIAC CATHETERIZATION  2002   non obstructive disease  . colonoscopy with polypectomy  2007 & 2012    hyperplastic ;Dr Watt Climes  . COLONOSCOPY WITH PROPOFOL N/A 06/04/2015   Procedure: COLONOSCOPY WITH PROPOFOL;  Surgeon: Jerene Bears, MD;  Location: [facility];  Service: Gastroenterology;  Laterality: N/A;  . DEBRIDEMENT LEG Right 03/02/2018   WOUND VAC APPLIED  . DEBRIDEMENT LEG Right 05/27/2018   RIGHT LOWER LEG DEBRIDEMENT,  SKIN GRAFT, VAC PLACEMENT   . DILATION AND CURETTAGE OF UTERUS     multiple  . HEMORRHOID SURGERY    . I&D EXTREMITY Right 03/02/2018   Procedure: RIGHT LEG DEBRIDEMENT AND PLACE VAC;  Surgeon: [name], MD;  Location: [place];  Service: Orthopedics;  Laterality: Right;  . I&D EXTREMITY Right 03/04/2018   Procedure: REPEAT IRRIGATION AND DEBRIDEMENT RIGHT LEG, PLACE WOUND VAC;  Surgeon: [name], MD;  Location: [facility];  Service: Orthopedics;  Laterality: Right;  . I&D EXTREMITY Right 03/30/2018   Procedure: IRRIGATION AND DEBRIDEMENT RIGHT LEG, APPLY WOUND VAC;  Surgeon: [name], MD;  Location: [place];  Service: Orthopedics;  Laterality: Right;  . I&D EXTREMITY Right 05/27/2018   Procedure: RIGHT LOWER LEG DEBRIDEMENT,  SKIN GRAFT, VAC PLACEMENT;  Surgeon: [name],  Illene Regulus, MD;  Location: [facility];  Service: Orthopedics;  Laterality: Right;  RIGHT LOWER LEG DEBRIDEMENT,  SKIN GRAFT, VAC PLACEMENT  . renal calculi  12/1997   SVT with induction of anesthesia  . right knee arthroscopy    . SKIN SPLIT GRAFT Right 03/04/2018   Procedure: POSSIBLE SPLIT THICKNESS SKIN GRAFT;  Surgeon: [name], MD;  Location: [place];  Service: Orthopedics;  Laterality: Right;  . SKIN SPLIT GRAFT Right 04/01/2018   Procedure: REPEAT IRRIGATION AND DEBRIDEMENT RIGHT LEG, APPLY SPLIT THICKNESS SKIN GRAFT;  Surgeon: [name], MD;  Location: [facility] [place];  Service: Orthopedics;  Laterality: Right;  . TONSILLECTOMY AND ADENOIDECTOMY      Social History   Socioeconomic History  . Marital status: Married    Spouse name: April [name]  . Number of children: 1  . Years of education: BS  . Highest education level: Not on file  Occupational History  . Occupation: Disabled    Employer: RETIRED  Social Needs  . Financial resource strain: Not on file  . Food insecurity    Worry: Not on file    Inability: Not on file  . Transportation needs    Medical: Not on file    Non-medical: Not on file  Tobacco Use  . Smoking status: Never Smoker  . Smokeless tobacco: Never Used  Substance and Sexual Activity  . Alcohol use: No    Alcohol/week: 0.0 standard drinks  . Drug use: No  . Sexual activity: Not on file  Lifestyle  . Physical activity    Days per week: Not on file    Minutes per session: Not on file  . Stress: Not on file  Relationships  . Social Herbalist on phone: Not on file    Gets together: Not on file    Attends religious service: Not on file    Active member of club or organization: Not on file    Attends meetings of clubs or organizations: Not on file    Relationship status: Not on file  Other Topics Concern  . Not on file  Social History Narrative   Patient lives at home with her husband April [name]) . Patient is retired and has a Conservation officer, nature.    Caffeine - some times.   Right handed.   Family History  Problem Relation Age of Onset  . Colon cancer Mother   . Prostate cancer Father   . Colon cancer Father   . Diabetes Maternal Aunt   . Breast cancer Maternal Aunt   . Diabetes Maternal Uncle   . Diabetes Paternal Aunt   . Stroke Paternal Aunt        > 65  . Heart disease Paternal Aunt   . Diabetes Paternal Uncle   . Breast cancer Maternal Aunt         X 2  . Breast cancer Cousin    - negative except otherwise stated in the family history section Allergies  Allergen Reactions  . Penicillins Rash and Other (See Comments)    She was told not to take it anymore. Has patient had a  PCN reaction causing immediate rash, facial/tongue/throat swelling, SOB or lightheadedness with hypotension: Yes Has patient had a PCN reaction causing severe rash involving mucus membranes or skin necrosis: No Has patient had a PCN reaction that required hospitalization: No Has patient had a PCN reaction occurring within the last 10 years: No If all of the above answers are "  NO", then may proceed with Cephalosporin use.'  . Shellfish Allergy Anaphylaxis  . Sulfonamide Derivatives Anaphylaxis and Other (See Comments)    REACTION: internal "burning"  . Hydrochlorothiazide W-Triamterene Other (See Comments)    Hypokalemia  . Lotensin [Benazepril Hcl] Hives  . Dipyridamole Other (See Comments)    Unknown reaction  . Estrogens Other (See Comments)    Unknown reaction  . Hydrochlorothiazide Other (See Comments)    Unknown reaction  . Metronidazole Other (See Comments)    Unknown reaction  . Other     Pickles-itching  . Spironolactone Other (See Comments)    UNSPECIFIED > "kidney problems"  . Sulfa Antibiotics Other (See Comments)    Unknown reaction  . Torsemide Other (See Comments)    Unknown reaction  . Valsartan Other (See Comments)    Unknown reaction  . Madelaine Bhat Isothiocyanate] Itching   Prior to Admission medications   Medication Sig Start Date End Date Taking? Authorizing Provider  albuterol (PROVENTIL HFA) 108 (90 Base) MCG/ACT inhaler Inhale 2 puffs into the lungs every 4 (four) hours as needed for wheezing or shortness of breath. 09/11/16 09/27/18 Yes Burns, Claudina Lick, MD  albuterol (PROVENTIL) (2.5 MG/3ML) 0.083% nebulizer solution Take 3 mLs (2.5 mg total) by nebulization every 6 (six) hours as needed for wheezing or shortness of breath. 03/23/17  Yes Burns, Claudina Lick, MD  Amino Acids-Protein Hydrolys (FEEDING SUPPLEMENT, PRO-STAT SUGAR FREE 64,) LIQD Take 30 mLs by mouth 3 (three) times daily.   Yes [provider]  aspirin (ECOTRIN LOW STRENGTH) 81 MG EC tablet  Take 81 mg by mouth at bedtime.    Yes [provider]  budesonide-formoterol (SYMBICORT) 160-4.5 MCG/ACT inhaler one - two inhalations every 12 hours; gargle and spit after use Patient taking differently: Inhale 2 puffs into the lungs 2 (two) times daily as needed (shortness of breath). gargle and spit after use 02/19/15  Yes Burns, Claudina Lick, MD  calcium carbonate (TUMS - DOSED IN MG ELEMENTAL CALCIUM) 500 MG chewable tablet Chew 2 tablets by mouth daily as needed for indigestion or heartburn.   Yes [provider]  carvedilol (COREG) 25 MG tablet TAKE 1 TABLET BY MOUTH TWICE DAILY WITH A MEAL Patient taking differently: Take 25 mg by mouth daily.  07/18/18  Yes Burns, Claudina Lick, MD  COD LIVER OIL PO Take 1 capsule by mouth daily.   Yes [provider]  cyanocobalamin (,VITAMIN B-12,) 1000 MCG/ML injection INJECT 1ML INTO MUSCLE EVERY 30 DAYS Patient taking differently: Inject 1,000 mcg into the muscle every 30 (thirty) days.  05/06/18  Yes Burns, Claudina Lick, MD  diclofenac sodium (VOLTAREN) 1 % GEL Apply 2 g topically 4 (four) times daily. Patient taking differently: Apply 2 g topically 4 (four) times daily as needed (pain).  03/20/16  Yes Marcial Pacas, MD  diphenhydrAMINE (BENADRYL) 12.5 MG/5ML liquid Take 25 mg by mouth daily as needed for itching.   Yes [provider]  docusate sodium (COLACE) 100 MG capsule Take 1 capsule (100 mg total) by mouth 2 (two) times daily. Patient taking differently: Take 100 mg by mouth 2 (two) times daily as needed for mild constipation.  03/09/18  Yes Rayburn, Neta Mends, PA-C  EPINEPHrine 0.3 mg/0.3 mL IJ SOAJ injection Inject 0.3 mLs (0.3 mg total) into the muscle once as needed for anaphylaxis. 08/12/18  Yes Padgett, Rae Halsted, MD  famotidine (PEPCID) 20 MG tablet Take 1 tablet (20 mg total) by mouth 2 (two) times daily. 12/14/17  Yes Burns, Claudina Lick, MD  Flaxseed, Linseed, (FLAX SEEDS PO) Take 1 capsule by mouth 2 (two) times a  day.   Yes [provider]  folic acid (FOLVITE) 902 MCG tablet Take 400 mcg by mouth at bedtime.    Yes [provider]  furosemide (LASIX) 40 MG tablet Take 3 tablets (120 mg total) by mouth 2 (two) times daily. 08/14/16  Yes Burns, Claudina Lick, MD  hydrOXYzine (ATARAX/VISTARIL) 25 MG tablet Take 10 mg by mouth every 6 (six) hours as needed for itching.    Yes [provider]  montelukast (SINGULAIR) 10 MG tablet Take 1 tablet (10 mg total) by mouth at bedtime. 08/12/18  Yes Padgett, Rae Halsted, MD  Multiple Vitamin (MULTIVITAMIN WITH MINERALS) TABS tablet Take 1 tablet by mouth daily.   Yes [provider]  Omega-3 Fatty Acids (FISH OIL) 1000 MG CAPS Take 1,000 mg by mouth daily.    Yes [provider]  OXcarbazepine (TRILEPTAL) 150 MG tablet Take 1 tablet (150 mg total) by mouth 2 (two) times daily. 09/13/17  Yes Marcial Pacas, MD  permethrin (ELIMITE) 5 % cream Apply 1 application topically as needed. Hives and rash on arms and chest 08/13/18  Yes [provider]  potassium chloride SA (K-DUR,KLOR-CON) 20 MEQ tablet TAKE 3 TABLETS BY MOUTH DAILY. Patient taking differently: Take 60 mEq by mouth daily.  02/25/18  Yes Burns, Claudina Lick, MD  silver sulfADIAZINE (SILVADENE) 1 % cream Apply 1 application topically daily. 06/08/18  Yes [name], MD  simvastatin (ZOCOR) 40 MG tablet Take 1 tablet (40 mg total) by mouth at bedtime. 04/25/18  Yes Burns, Claudina Lick, MD  spironolactone (ALDACTONE) 25 MG tablet TAKE 1 TABLET BY MOUTH ONCE DAILY Patient taking differently: Take 25 mg by mouth daily.  01/12/18  Yes Burns, Claudina Lick, MD  Lancets [facility] ULTRASOFT) lancets Use to help check blood sugars twice a day Dx E11.9 04/29/15   [name], MD  oxyCODONE-acetaminophen (PERCOCET/ROXICET) 5-325 MG tablet Take 1 tablet by mouth every 6 (six) hours as needed for severe pain. 08/22/18   Thurnell Lose, MD  SYRINGE-NEEDLE, DISP, 3 ML (B-D 3CC LUER-LOK SYR  23GX1-1/2) 23G X 1-1/2" 3 ML MISC USE TO INJECT B-12 ONCE MONTHLY 08/15/18   Burns, Claudina Lick, MD  SYRINGE-NEEDLE, DISP, 3 ML 23G X 1" 3 ML MISC Use with B12 to inject IM every 30 days 05/06/16   [name], MD   No results found. - pertinent xrays, CT, MRI studies were reviewed and independently interpreted  Positive ROS: All other systems have been reviewed and were otherwise negative with the exception of those mentioned in the HPI and as above.  Physical Exam: General: Alert, no acute distress Psychiatric: Patient is competent for consent with normal mood and affect Lymphatic: No axillary or cervical lymphadenopathy Cardiovascular: No pedal edema Respiratory: No cyanosis, no use of accessory musculature GI: No organomegaly, abdomen is soft and non-tender    Images:  @ENCIMAGES @  Labs:  Lab Results  Component Value Date   HGBA1C 6.4 07/15/2018   HGBA1C 6.1 (H) 05/27/2018   HGBA1C 6.5 12/03/2017   ESRSEDRATE 42 (H) 08/12/2018   ESRSEDRATE 56 (H) 03/09/2018   ESRSEDRATE 13 06/02/2016   CRP 6.3 (H) 03/09/2018   CRP 2.2 06/02/2016   LABURIC 14.8 (H) 08/20/2018   REPTSTATUS 08/20/2018 FINAL 08/15/2018   GRAMSTAIN  05/27/2018    FEW WBC PRESENT, PREDOMINANTLY PMN MODERATE GRAM POSITIVE RODS RARE Lonell Grandchild  POSITIVE COCCI IN PAIRS    CULT  08/15/2018    NO GROWTH 5 DAYS Performed at [facility], [place] [address], [place], [place] [address]    LABORGA METHICILLIN RESISTANT STAPHYLOCOCCUS AUREUS 05/27/2018    Lab Results  Component Value Date   ALBUMIN 3.3 (L) 08/15/2018   ALBUMIN 3.7 07/15/2018   ALBUMIN 2.2 (L) 03/08/2018   LABURIC 14.8 (H) 08/20/2018    Neurologic: Patient does not have protective sensation bilateral lower extremities.   MUSCULOSKELETAL:   Skin: Examination the ulcer on the right leg is smaller has good granulation tissue has a silver alginate dressing.  Patient has no ulcers on the plantar aspect of either foot.  There is no cellulitis in  either leg or foot no signs of infection.  Assessment: Assessment: Healing venous ulcer right lower extremity.  Plan: Plan: Continue with the silver alginate dressings to the right leg.  I will follow-up in the office after discharge patient will call the office for follow-up appointment.  Thank you for the consult and the opportunity to see Ms. Princella Ion, MD [facility] [phone] 5:26 PM

## 2018-08-22 NOTE — Progress Notes (Signed)
Inpatient Rehabilitation Admissions Coordinator  I met with patient at bedside and she has progressed well with therapy. I will pursue insurance authorization with [employer] [employer] for a possible inpt rehab admission per patient preference. I will follow up once I have heard insurance decision.  Danne Baxter, RN, MSN Rehab Admissions Coordinator [phone] 08/22/2018 11:20 AM

## 2018-08-22 NOTE — Plan of Care (Signed)

## 2018-08-22 NOTE — Progress Notes (Signed)
Physical Therapy Treatment Patient Details Name: April [name] MRN: [id number] DOB: 1950/12/19 Today's Date: 08/22/2018    History of Present Illness April [name] is an 68 y.o. female with past medical history significant for morbid obesity, chronic right lower extremity wound, diabetes mellitus complicated by severe neuropathy, chronic kidney disease, hypertension who was in her usual state of health until 4 days prior to admission when she had onset of the "stinging, electric shocks, needles and stabbing pain" in her legs bilaterally.    PT Comments    Pt is calm today, very motivated and worked on LE ex's then to walk to the chair.  Her perception is that she has had a light session but actually did more than in previous days.  Her motivation is good, still a high recommendation to go to CIR for follow up therapy to increase safety and control of gait and balance on a RW.  Continue acute therapy as needed to support these goals.   Follow Up Recommendations  CIR     Equipment Recommendations  Rolling walker with 5" wheels    Recommendations for Other Services       Precautions / Restrictions Precautions Precautions: Fall Restrictions Weight Bearing Restrictions: No    Mobility  Bed Mobility Overal bed mobility: Needs Assistance Bed Mobility: Supine to Sit     Supine to sit: Min guard     General bed mobility comments: extra time to get up to side of bed  Transfers Overall transfer level: Needs assistance Equipment used: Rolling walker (2 wheeled) Transfers: Sit to/from [facility] Sit to Stand: Mod assist Stand pivot transfers: Min assist       General transfer comment: mod assist with bed higher to initiate standing  Ambulation/Gait Ambulation/Gait assistance: Min assist Gait Distance (Feet): 10 Feet Assistive device: Rolling walker (2 wheeled) Gait Pattern/deviations: Step-through pattern;Step-to pattern;Wide base of support Gait  velocity: reduced Gait velocity interpretation: <1.31 ft/sec, indicative of household ambulator General Gait Details: pt is up to walk with purwick removed and declined to use BSC as preference once up sitting   Stairs             Wheelchair Mobility    Modified Rankin (Stroke Patients Only)       Balance     Sitting balance-Leahy Scale: Fair     Standing balance support: Bilateral upper extremity supported;During functional activity Standing balance-Leahy Scale: Poor Standing balance comment: hesitant to initiate steps                            Cognition Arousal/Alertness: Awake/alert Behavior During Therapy: WFL for tasks assessed/performed Overall Cognitive Status: Within Functional Limits for tasks assessed                                        Exercises General Exercises - Lower Extremity Ankle Circles/Pumps: AAROM;Both;5 reps Quad Sets: AROM;Both;10 reps Gluteal Sets: AROM;Both;10 reps Heel Slides: AROM;10 reps Hip ABduction/ADduction: AAROM;10 reps    General Comments        Pertinent Vitals/Pain Pain Assessment: Faces Faces Pain Scale: Hurts even more Pain Location: generally on legs Pain Descriptors / Indicators: Burning;Aching Pain Intervention(s): Limited activity within patient's tolerance;Monitored during session;Premedicated before session;Repositioned    Home Living  Prior Function            PT Goals (current goals can now be found in the care plan section) Progress towards PT goals: Progressing toward goals    Frequency    Min 2X/week      PT Plan Current plan remains appropriate    Co-evaluation              AM-PAC PT "6 Clicks" Mobility   Outcome Measure  Help needed turning from your back to your side while in a flat bed without using bedrails?: A Little Help needed moving from lying on your back to sitting on the side of a flat bed without using  bedrails?: A Little Help needed moving to and from a bed to a chair (including a wheelchair)?: A Little Help needed standing up from a chair using your arms (e.g., wheelchair or bedside chair)?: A Lot Help needed to walk in hospital room?: A Little Help needed climbing 3-5 steps with a railing? : A Lot 6 Click Score: 16    End of Session Equipment Utilized During Treatment: Gait belt Activity Tolerance: Patient tolerated treatment well Patient left: in chair;with call bell/phone within reach Nurse Communication: Mobility status PT Visit Diagnosis: Difficulty in walking, not elsewhere classified (R26.2);Pain Pain - Right/Left: Right Pain - part of body: Ankle and joints of foot     Time: 1125-1156 PT Time Calculation (min) (ACUTE ONLY): 31 min  Charges:                      Ramond Dial 08/22/2018, 1:25 PM   April [name], PT MS Acute Rehab Dept. Number: [place] [facility] [place]

## 2018-08-22 NOTE — Care Management Important Message (Signed)
Important Message  Patient Details  Name: April [name] MRN: [id number] Date of Birth: 27-Jun-1950   Medicare Important Message Given:  Yes     Memory Argue 08/22/2018, 4:09 PM

## 2018-08-22 NOTE — Progress Notes (Signed)
PROGRESS NOTE    April [name]  JOA:[id number] DOB: 01-Nov-1950 DOA: 08/15/2018 PCP: [name], MD    Brief Narrative:   April [name] is an 68 y.o. female with past medical history significant for morbid obesity, chronic right lower extremity wound, diabetes mellitus complicated by severe neuropathy, chronic kidney disease, hypertension who was in her usual state of health until 4 days prior to admission when she had onset of the "stinging, electric shocks, needles and stabbing pain" in her legs bilaterally.  Patient states that she has had difficulty with neuropathy in the past and has been on multiple different medications for it and is not sure what she is on for it at present.  Note she has had similar symptoms in the past however "it has been quite a while."  Patient contacted her neurologist and her PCP both of whom told her to come to the ED to be evaluated for an infection.  Patient also contacted Dr. [name] who manages her chronic lower extremity ulcer and he noted that he would see her today however she decided to come to the ED instead.  Patient denies any fevers or chills.  Patient thinks that her biggest problem is difficulty ambulating due to the severe pain she has had for the past 4 days.  She notes that at baseline she is able to walk a few steps to a bedside commode however spends most of the day sitting at her kitchen table or in a recliner.  She notes that her legs stay wrapped and she has not had any increased pain in her leg other than the very specific pain that developed suddenly in both of her legs 4 days ago.  Patient denies missing any of her medications.   Subjective: Patient in bed having breakfast in no distress, left ankle pain improved, no other subjective complaints except some intermittent itching in arms and legs which is a chronic problem for her.  Assessment & Plan:    68 year old female with morbid obesity, severe venous hypertension with chronic  right lower extremity wound that is actively being followed by Dr. [name] presents with what sounds like sudden worsening of patient's baseline diabetic neuropathy.  No evidence of infection on examination and patient has no leukocytosis or left shift.  Also noted to have subacute worsening of her kidney function over the past 2 months.   Acute on chronic kidney disease stage III Patient reports follows with nephrology outpatient, Dr. [name].  GFR 48 May 20 with a creatinine of 1.32, now GFR declined to 23 on admission with a creatine of 2.45.  Likely had some element of overdiuresis Lasix dose has been dropped to 80 twice daily by nephrology with improvement in renal function, now close to her baseline, renal ultrasound was stable, renal function now at or better than baseline of around 2.  Filed Weights   08/15/18 1158 08/15/18 1736  Weight: (!) 158.3 kg (!) 158 kg     Intake/Output Summary (Last 24 hours) at 08/22/2018 1016 Last data filed at 08/22/2018 0931 Gross per 24 hour  Intake 940 ml  Output 1650 ml  Net -710 ml    Severe diabetic neuropathy Patient clearly has very significant neuropathy and is followed for a long time by Dr. [name] of neurology. She has clearly been tried on multiple different neuropathic medications.  --oxcarbazepine increased to 300mg  PO BID on 7/16 --Robaxin 500mg  PO q6h prn --PT/OT recommends SNF --Patient desires CIR; consult placed  --social work  for coordination  Chronic right lower extremity ulcer Follows with orthopedics, Dr. [name]. Looks clean without evidence of infection.  Patient is afebrile without leukocytosis.  Patient received dose of vancomycin ordered by EDP, no need for further antibiotics at this time as no clear acute infectious etiology currently present. Blood cultures x2: NG x 2 days, lower extremity venous duplex negative.  Could not get MRI as patient was refused the procedure due to intolerance.  Continue wound care and outpatient  follow-up with Dr. [name] post discharge.  Signs of active infection.   DM2  Diet controlled.  Hemoglobin A1c 6.4 on 07/15/2018 --Continue CBGs qAC/HS --Insulin sliding scale for coverage  CBG (last 3)  Recent Labs    08/21/18 1606 08/21/18 2133 08/22/18 0834  GLUCAP 220* 205* 137*     Essential hypertension - continue Coreg and diuretics.    HFpEF  Patient appears to be compensated. Continue carvedilol, Lasix and spironolactone  COPD  No evidence for acute flare, continue montelukast, Dulera and as needed albuterol MDI  Severe morbid obesity -  BMI 60.  Discussed with patient need for aggressive lifestyle changes and weight loss as this complicates all facets of care; in which this is likely the leading etiology of most of her chronic comorbidities as above.  Left ankle pain.  Likely due to acute gout.  Uric acid level was elevated above 10, given a dose of steroid on 08/21/2018 and placed on colchicine.  X-ray was stable pain has improved repeat steroid 1 more dose on 08/22/2018.    Ethics/Social: Patient continues to appear comfortable in bed, but any sort manipulation of her legs/feet causes extreme discomfort, although no other objective findings of poorly controlled pain such as tachycardia appreciated during hospitalization.  Patient reports that her 29 year old father helps her with transportation and ADLs at home; which is highly concerning was discussed with patient extensively today.  Patient requests that she be evaluated by inpatient rehab, which the consult is currently pending otherwise she will not agree to SNF placement and desires to return home if unable to obtain CIR placement.  DVT prophylaxis: Lovenox Code Status: Full code Family Communication: None Disposition Plan: Inpatient, PT/OT recommends SNF, CIR evaluation pending, medically stable for discharge on 08/20/2018.   Consultants:   None  Procedures:  None  Antimicrobials:   Vancomycin 7/13 -  7/13    Objective: Vitals:   08/22/18 0445 08/22/18 0819 08/22/18 0837 08/22/18 0842  BP: 127/60  (!) 114/40   Pulse: 84  69 72  Resp: 16  16   Temp: 98.6 F (37 C)  98.8 F (37.1 C)   TempSrc: Oral  Oral   SpO2: 94% 92%  95%  Weight:      Height:        Intake/Output Summary (Last 24 hours) at 08/22/2018 1016 Last data filed at 08/22/2018 0931 Gross per 24 hour  Intake 940 ml  Output 1650 ml  Net -710 ml   Filed Weights   08/15/18 1158 08/15/18 1736  Weight: (!) 158.3 kg (!) 158 kg    Examination:  Awake Alert, Oriented X 3, No new F.N deficits, Normal affect [place].AT,PERRAL Supple Neck,No JVD, No cervical lymphadenopathy appriciated.  Symmetrical Chest wall movement, Good air movement bilaterally, CTAB RRR,No Gallops, Rubs or new Murmurs, No Parasternal Heave +ve B.Sounds, Abd Soft, No tenderness, No organomegaly appriciated, No rebound - guarding or rigidity. No Cyanosis, Clubbing or edema, mild to moderate left ankle tenderness on palpation    Data  Reviewed: I have personally reviewed following labs and imaging studies  CBC: Recent Labs  Lab 08/15/18 1019 08/16/18 0252 08/20/18 0512  WBC 5.2 5.7 5.6  NEUTROABS 3.1  --   --   HGB 10.4* 9.9* 9.3*  HCT 34.0* 31.9* 30.3*  MCV 93.9 93.3 94.4  PLT 172 157 253   Basic Metabolic Panel: Recent Labs  Lab 08/16/18 0252 08/17/18 0803 08/18/18 0346 08/19/18 0415 08/20/18 0512  NA 140 141 139 136 138  K 4.0 3.6 3.7 3.6 3.7  CL 102 102 101 98 99  CO2 28 32 29 28 29   GLUCOSE 141* 109* 105* 132* 94  BUN 28* 26* 24* 23 23  CREATININE 2.03* 2.06* 1.92* 1.92* 1.77*  CALCIUM 8.5* 8.3* 8.3* 8.5* 8.5*  MG  --   --  2.1  --  2.1   GFR: Estimated Creatinine Clearance: 46.1 mL/min (A) (by C-G formula based on SCr of 1.77 mg/dL (H)). Liver Function Tests: Recent Labs  Lab 08/15/18 1019  AST 20  ALT 15  ALKPHOS 74  BILITOT 0.6  PROT 6.5  ALBUMIN 3.3*   No results for input(s): LIPASE, AMYLASE in the last  168 hours. No results for input(s): AMMONIA in the last 168 hours. Coagulation Profile: No results for input(s): INR, PROTIME in the last 168 hours. Cardiac Enzymes: No results for input(s): CKTOTAL, CKMB, CKMBINDEX, TROPONINI in the last 168 hours. BNP (last 3 results) No results for input(s): PROBNP in the last 8760 hours. HbA1C: No results for input(s): HGBA1C in the last 72 hours. CBG: Recent Labs  Lab 08/21/18 0747 08/21/18 1153 08/21/18 1606 08/21/18 2133 08/22/18 0834  GLUCAP 94 216* 220* 205* 137*   Lipid Profile: No results for input(s): CHOL, HDL, LDLCALC, TRIG, CHOLHDL, LDLDIRECT in the last 72 hours. Thyroid Function Tests: No results for input(s): TSH, T4TOTAL, FREET4, T3FREE, THYROIDAB in the last 72 hours. Anemia Panel: No results for input(s): VITAMINB12, FOLATE, FERRITIN, TIBC, IRON, RETICCTPCT in the last 72 hours. Sepsis Labs: Recent Labs  Lab 08/15/18 1124 08/15/18 1830  LATICACIDVEN 1.0 1.5    Recent Results (from the past 240 hour(s))  Culture, blood (routine x 2)     Status: None   Collection Time: 08/15/18 12:03 PM   Specimen: BLOOD  Result Value Ref Range Status   Specimen Description BLOOD RIGHT ANTECUBITAL  Final   Special Requests   Final    BOTTLES DRAWN AEROBIC AND ANAEROBIC Blood Culture adequate volume   Culture   Final    NO GROWTH 5 DAYS Performed at [facility], [address] [place], [place] [address]    Report Status 08/20/2018 FINAL  Final  SARS Coronavirus 2 (CEPHEID - Performed in [facility] hospital lab), Hosp Order     Status: None   Collection Time: 08/15/18  4:55 PM   Specimen: Nasopharyngeal Swab  Result Value Ref Range Status   SARS Coronavirus 2 NEGATIVE NEGATIVE Final    Comment: (NOTE) If result is NEGATIVE SARS-CoV-2 target nucleic acids are NOT DETECTED. The SARS-CoV-2 RNA is generally detectable in upper and lower  respiratory specimens during the acute phase of infection. The lowest  concentration  of SARS-CoV-2 viral copies this assay can detect is 250  copies / mL. A negative result does not preclude SARS-CoV-2 infection  and should not be used as the sole basis for treatment or other  patient management decisions.  A negative result may occur with  improper specimen collection / handling, submission of  specimen other  than nasopharyngeal swab, presence of viral mutation(s) within the  areas targeted by this assay, and inadequate number of viral copies  (<250 copies / mL). A negative result must be combined with clinical  observations, patient history, and epidemiological information. If result is POSITIVE SARS-CoV-2 target nucleic acids are DETECTED. The SARS-CoV-2 RNA is generally detectable in upper and lower  respiratory specimens dur ing the acute phase of infection.  Positive  results are indicative of active infection with SARS-CoV-2.  Clinical  correlation with patient history and other diagnostic information is  necessary to determine patient infection status.  Positive results do  not rule out bacterial infection or co-infection with other viruses. If result is PRESUMPTIVE POSTIVE SARS-CoV-2 nucleic acids MAY BE PRESENT.   A presumptive positive result was obtained on the submitted specimen  and confirmed on repeat testing.  While 2019 novel coronavirus  (SARS-CoV-2) nucleic acids may be present in the submitted sample  additional confirmatory testing may be necessary for epidemiological  and / or clinical management purposes  to differentiate between  SARS-CoV-2 and other Sarbecovirus currently known to infect humans.  If clinically indicated additional testing with an alternate [name]  methodology [phone]) is advised. The SARS-CoV-2 RNA is generally  detectable in upper and lower respiratory sp ecimens during the acute  phase of infection. The expected result is Negative. Fact Sheet for Patients:  StrictlyIdeas.no Fact Sheet for Healthcare  Providers: BankingDealers.co.za This [name] is not yet approved or cleared by the [place] FDA and has been authorized for detection and/or diagnosis of SARS-CoV-2 by FDA under an Emergency Use Authorization (EUA).  This EUA will remain in effect (meaning this [name] can be used) for the duration of the COVID-19 declaration under Section 564(b)(1) of the Act, 21 U.S.C. section 360bbb-3(b)(1), unless the authorization is terminated or revoked sooner. Performed at [facility], [place] [address], [place], [place] [address]   Culture, blood (Routine X 2) w Reflex to ID Panel     Status: None   Collection Time: 08/15/18 10:29 PM   Specimen: BLOOD RIGHT HAND  Result Value Ref Range Status   Specimen Description BLOOD RIGHT HAND  Final   Special Requests   Final    BOTTLES DRAWN AEROBIC ONLY Blood Culture adequate volume   Culture   Final    NO GROWTH 5 DAYS Performed at [facility], [place] [address], [place], [place] [address]    Report Status 08/20/2018 FINAL  Final  MRSA PCR Screening     Status: None   Collection Time: 08/16/18 10:47 AM   Specimen: Nasal Mucosa; Nasopharyngeal  Result Value Ref Range Status   MRSA by PCR NEGATIVE NEGATIVE Final    Comment:        The GeneXpert MRSA Assay (FDA approved for NASAL specimens only), is one component of a comprehensive MRSA colonization surveillance program. It is not intended to diagnose MRSA infection nor to guide or monitor treatment for MRSA infections. Performed at [facility], [place] [address], [place], [place] [address]     Radiology Studies: Dg Ankle 2 Views Left  Result Date: 08/20/2018 CLINICAL DATA:  Acute left ankle pain without known injury. EXAM: LEFT ANKLE - 2 VIEW COMPARISON:  None. FINDINGS: There is no evidence of fracture, dislocation, or joint effusion. There is no evidence of arthropathy or other focal bone abnormality. Soft tissues are unremarkable. IMPRESSION: Negative.  Electronically Signed   By: Bobbe Medico.D.  On: 08/20/2018 15:27    Scheduled Meds:  aspirin EC  81 mg Oral QHS   carvedilol  25 mg Oral Daily   [START ON 08/23/2018] colchicine  0.6 mg Oral Daily   famotidine  20 mg Oral BID   furosemide  80 mg Oral BID   heparin  5,000 Units Subcutaneous Q8H   insulin aspart  0-9 Units Subcutaneous TID WC   mometasone-formoterol  2 puff Inhalation BID   montelukast  10 mg Oral QHS   OXcarbazepine  300 mg Oral BID   oxyCODONE  2.5 mg Oral Once   simvastatin  40 mg Oral QHS   sodium chloride flush  3 mL Intravenous Q12H   spironolactone  25 mg Oral Daily   Continuous Infusions:  sodium chloride 10 mL/hr at 08/16/18 0300     LOS: 5 days    Time spent: 39 minutes; personally reviewing all labs/imaging studies, discussion with nursing staff/consultants  Signature  Lala Lund M.D on 08/22/2018 at 10:16 AM   -  To page go to [email or web address]

## 2018-08-23 ENCOUNTER — Ambulatory Visit: Payer: Medicare Other | Admitting: Orthopedic Surgery

## 2018-08-23 LAB — GLUCOSE, CAPILLARY
Glucose-Capillary: 91 mg/dL (ref 70–99)
Glucose-Capillary: 191 mg/dL — ABNORMAL HIGH (ref 70–99)
Glucose-Capillary: 150 mg/dL — ABNORMAL HIGH (ref 70–99)

## 2018-08-23 MED ORDER — COLCHICINE 0.6 MG PO TABS: 0.6000 mg | ORAL_TABLET | Freq: Every day | ORAL | 0 refills | Status: DC

## 2018-08-23 MED ORDER — GABAPENTIN 100 MG PO CAPS: 100.0000 mg | ORAL_CAPSULE | Freq: Three times a day (TID) | ORAL | 0 refills | Status: DC

## 2018-08-23 MED ORDER — OXCARBAZEPINE 300 MG PO TABS: 300.0000 mg | ORAL_TABLET | Freq: Two times a day (BID) | ORAL | 0 refills | Status: DC

## 2018-08-23 MED ORDER — FUROSEMIDE 80 MG PO TABS: 80.0000 mg | ORAL_TABLET | Freq: Two times a day (BID) | ORAL | 0 refills | Status: DC

## 2018-08-23 MED ORDER — WHITE PETROLATUM EX OINT
TOPICAL_OINTMENT | CUTANEOUS | Status: AC
  Administered 2018-08-23: 0.2
  Filled 2018-08-23: qty 28.35

## 2018-08-23 NOTE — Discharge Instructions (Signed)
Follow with Primary MD Burns, Stacy J, MD in 7 days   Get CBC, CMP, 2 view Chest X ray -  checked next visit within 1 week by Primary MD    Activity: As tolerated with Full fall precautions use walker/cane & assistance as needed  Disposition Home    Diet: Heart Healthy   Special Instructions: If you have smoked or chewed Tobacco  in the last 2 yrs please stop smoking, stop any regular Alcohol  and or any Recreational drug use.  On your next visit with your primary care physician please Get Medicines reviewed and adjusted.  Please request your Prim.MD to go over all Hospital Tests and Procedure/Radiological results at the follow up, please get all Hospital records sent to your Prim MD by signing hospital release before you go home.  If you experience worsening of your admission symptoms, develop shortness of breath, life threatening emergency, suicidal or homicidal thoughts you must seek medical attention immediately by calling 911 or calling your MD immediately  if symptoms less severe.  You Must read complete instructions/literature along with all the possible adverse reactions/side effects for all the Medicines you take and that have been prescribed to you. Take any new Medicines after you have completely understood and accpet all the possible adverse reactions/side effects.         

## 2018-08-23 NOTE — Discharge Summary (Signed)
April [name] GBT:[id number] DOB: Jun 15, 1950 DOA: 08/15/2018  PCP: [name], MD  Admit date: 08/15/2018  Discharge date: 08/23/2018  Admitted From: Home   Disposition:  Home   Recommendations for Outpatient Follow-up:   Follow up with PCP in 1-2 weeks  PCP Please obtain BMP/CBC, 2 view CXR in 1week,  (see Discharge instructions)   PCP Please follow up on the following pending results:    Home Health: PT,RN   Equipment/Devices: per CM  Consultations: Dr [name] Discharge Condition: Stable    CODE STATUS: Full    Diet Recommendation: Heart Healthy     Chief Complaint  Patient presents with   Joint Swelling     Brief history of present illness from the day of admission and additional interim summary    April [name] an 68 y.o.femalewith past medical history significant for morbid obesity, chronic right lower extremity wound,diabetes mellitus complicated by severe neuropathy,chronic kidney disease, hypertension who was in her usual state of health until 4 days prior to admission when she had onset of the "stinging, electric shocks, needles and stabbing pain" in her legs bilaterally. Patient states that she has had difficulty with neuropathy in the past and has been on multiple different medications for it and is not sure what she is on for it at present. Note she has had similar symptoms in the past however "it has been quite a while."  Patient contacted her neurologist and her PCP both of whom told her to come to the ED to be evaluated for an infection. Patient also contacted Dr. [name] who manages her chronic lower extremity ulcer and he noted that he would see her today however she decided to come to the ED instead.  She was diagnosed with ARF, chronic right lower extremity wound without any  infection, neuropathic pain and admitted to the hospital.                                                                 Hospital Course    Acute on chronic kidney disease stage III Patient reports follows with nephrology outpatient, Dr. [name].  GFR 48 May 20 with a creatinine of 1.32, now GFR declined to 23 on admission with a creatine of 2.45.  Likely had some element of overdiuresis Lasix dose has been dropped to 80 twice daily by nephrology with improvement in renal function, now close to her baseline, renal ultrasound was stable, renal function now at or better than baseline of around 2.  Will be discharged on lower than home dose Lasix with outpatient PCP follow-up.  Severe diabetic neuropathy Patient clearly has very significant neuropathy and is followed for a long time by Dr. [name] of neurology. She has clearly been tried on multiple different neuropathic medications.  --oxcarbazepine increased to 300mg   PO BID on 7/16 --Added low-dose Neurontin at 100 mg 3 times daily due to her renal function being poor --Patient did not qualify for CIR will get home health, overall much improved will be discharged home.  Chronic right lower extremity ulcer Follows with orthopedics, Dr. [name]. Looks clean without evidence of infection.  Patient is afebrile without leukocytosis.  Patient received dose of vancomycin ordered by EDP, no need for further antibiotics at this time as no clear acute infectious etiology currently present. Blood cultures x2: NG x 2 days, lower extremity venous duplex negative.  Could not get MRI as patient was refused the procedure due to intolerance.  Continue wound care and outpatient follow-up with Dr. [name] post discharge.  No Signs of active infection.  We will get home health for wound care.   DM2 Diet controlled.  Hemoglobin A1c 6.4 on 07/15/2018 --Continue diet control  Essential hypertension - continue Coreg and diuretics.    HFpEF  Patient appears to be compensated.  Continue carvedilol, Lasix and spironolactone  COPD  No evidence for acute flare, continue montelukast, Dulera and as needed albuterol MDI  Severe morbid obesity -  BMI 60.  Discussed with patient need for aggressive lifestyle changes and weight loss as this complicates all facets of care; in which this is likely the leading etiology of most of her chronic comorbidities as above.  Left ankle pain.  Likely due to acute gout.  Uric acid level was elevated above 10, improved after 2 doses of steroids and colchicine, will be discharged on colchicine with PCP follow-up.  PCP to monitor uric acid levels.  Discharge diagnosis     Principal Problem:   Acute on chronic kidney failure (HCC) Active Problems:   Body mass index 60.0-69.9, adult (HCC)   Essential hypertension   Chronic diastolic heart failure (HCC)   Bilateral leg edema   Diabetes mellitus with neurological manifestations (HCC)   CKD (chronic kidney disease) stage 3, GFR 30-59 ml/min (HCC)   Diabetic peripheral neuropathy (HCC)   Idiopathic chronic venous hypertension of right lower extremity with ulcer and inflammation (HCC)   Intractable pain    Discharge instructions    Discharge Instructions    Diet - low sodium heart healthy   Complete by: As directed    Discharge instructions   Complete by: As directed    Follow with Primary MD [name], MD in 7 days   Get CBC, CMP, 2 view Chest X ray -  checked next visit within 1 week by Primary MD   Activity: As tolerated with Full fall precautions use walker/cane & assistance as needed  Disposition Home    Diet: Heart Healthy     Special Instructions: If you have smoked or chewed Tobacco  in the last 2 yrs please stop smoking, stop any regular Alcohol  and or any Recreational drug use.  On your next visit with your primary care physician please Get Medicines reviewed and adjusted.  Please request your Prim.MD to go over all Hospital Tests and Procedure/Radiological  results at the follow up, please get all Hospital records sent to your Prim MD by signing hospital release before you go home.  If you experience worsening of your admission symptoms, develop shortness of breath, life threatening emergency, suicidal or homicidal thoughts you must seek medical attention immediately by calling 911 or calling your MD immediately  if symptoms less severe.  You Must read complete instructions/literature along with all the possible adverse reactions/side effects for all  the Medicines you take and that have been prescribed to you. Take any new Medicines after you have completely understood and accpet all the possible adverse reactions/side effects.   Increase activity slowly   Complete by: As directed       Discharge Medications   Allergies as of 08/23/2018      Reactions   Penicillins Rash, Other (See Comments)   She was told not to take it anymore. Has patient had a PCN reaction causing immediate rash, facial/tongue/throat swelling, SOB or lightheadedness with hypotension: Yes Has patient had a PCN reaction causing severe rash involving mucus membranes or skin necrosis: No Has patient had a PCN reaction that required hospitalization: No Has patient had a PCN reaction occurring within the last 10 years: No If all of the above answers are "NO", then may proceed with Cephalosporin use.'   Shellfish Allergy Anaphylaxis   Sulfonamide Derivatives Anaphylaxis, Other (See Comments)   REACTION: internal "burning"   Hydrochlorothiazide W-triamterene Other (See Comments)   Hypokalemia   Lotensin [benazepril Hcl] Hives   Dipyridamole Other (See Comments)   Unknown reaction   Estrogens Other (See Comments)   Unknown reaction   Hydrochlorothiazide Other (See Comments)   Unknown reaction   Metronidazole Other (See Comments)   Unknown reaction   Other    Pickles-itching   Spironolactone Other (See Comments)   UNSPECIFIED > "kidney problems"   Sulfa Antibiotics Other  (See Comments)   Unknown reaction   Torsemide Other (See Comments)   Unknown reaction   Valsartan Other (See Comments)   Unknown reaction   Mustard [allyl Isothiocyanate] Itching      Medication List    STOP taking these medications   NEEDLE (DISP) 23 G 23G X 1-1/2" Misc Commonly known as: Easy Touch FlipLock Needles Replaced by: B-D 3CC LUER-LOK SYR 23GX1-1/2 23G X 1-1/2" 3 ML Misc     TAKE these medications   albuterol 108 (90 Base) MCG/ACT inhaler Commonly known as: Proventil HFA Inhale 2 puffs into the lungs every 4 (four) hours as needed for wheezing or shortness of breath. Notes to patient: Resume home regimen   albuterol (2.5 MG/3ML) 0.083% nebulizer solution Commonly known as: PROVENTIL Take 3 mLs (2.5 mg total) by nebulization every 6 (six) hours as needed for wheezing or shortness of breath.   budesonide-formoterol 160-4.5 MCG/ACT inhaler Commonly known as: Symbicort one - two inhalations every 12 hours; gargle and spit after use What changed:   how much to take  how to take this  when to take this  reasons to take this  additional instructions Notes to patient: Resume home regimen   calcium carbonate 500 MG chewable tablet Commonly known as: TUMS - dosed in mg elemental calcium Chew 2 tablets by mouth daily as needed for indigestion or heartburn. Notes to patient: Resume home regimen   carvedilol 25 MG tablet Commonly known as: COREG TAKE 1 TABLET BY MOUTH TWICE DAILY WITH A MEAL What changed: See the new instructions.   COD LIVER OIL PO Take 1 capsule by mouth daily.   colchicine 0.6 MG tablet Take 1 tablet (0.6 mg total) by mouth daily. Start taking on: August 24, 2018   cyanocobalamin 1000 MCG/ML injection Commonly known as: (VITAMIN B-12) INJECT 1ML INTO MUSCLE EVERY 30 DAYS What changed:   how much to take  how to take this  when to take this  additional instructions   diclofenac sodium 1 % Gel Commonly known as: VOLTAREN Apply  2 g  topically 4 (four) times daily. What changed:   when to take this  reasons to take this   diphenhydrAMINE 12.5 MG/5ML liquid Commonly known as: BENADRYL Take 25 mg by mouth daily as needed for itching.   docusate sodium 100 MG capsule Commonly known as: COLACE Take 1 capsule (100 mg total) by mouth 2 (two) times daily. What changed:   when to take this  reasons to take this   Ecotrin Low Strength 81 MG EC tablet Generic drug: aspirin Take 81 mg by mouth at bedtime.   EPINEPHrine 0.3 mg/0.3 mL Soaj injection Commonly known as: EPI-PEN Inject 0.3 mLs (0.3 mg total) into the muscle once as needed for anaphylaxis.   famotidine 20 MG tablet Commonly known as: Pepcid Take 1 tablet (20 mg total) by mouth 2 (two) times daily.   feeding supplement (PRO-STAT SUGAR FREE 64) Liqd Take 30 mLs by mouth 3 (three) times daily.   Fish Oil 1000 MG Caps Take 1,000 mg by mouth daily.   FLAX SEEDS PO Take 1 capsule by mouth 2 (two) times a day.   folic acid 623 MCG tablet Commonly known as: FOLVITE Take 400 mcg by mouth at bedtime.   furosemide 80 MG tablet Commonly known as: LASIX Take 1 tablet (80 mg total) by mouth 2 (two) times daily. What changed:   medication strength  how much to take   gabapentin 100 MG capsule Commonly known as: Neurontin Take 1 capsule (100 mg total) by mouth 3 (three) times daily.   hydrOXYzine 25 MG tablet Commonly known as: ATARAX/VISTARIL Take 10 mg by mouth every 6 (six) hours as needed for itching.   montelukast 10 MG tablet Commonly known as: SINGULAIR Take 1 tablet (10 mg total) by mouth at bedtime.   multivitamin with minerals Tabs tablet Take 1 tablet by mouth daily.   onetouch ultrasoft lancets Use to help check blood sugars twice a day Dx E11.9   Oxcarbazepine 300 MG tablet Commonly known as: TRILEPTAL Take 1 tablet (300 mg total) by mouth 2 (two) times daily. What changed:   medication strength  how much to take     oxyCODONE-acetaminophen 5-325 MG tablet Commonly known as: PERCOCET/ROXICET Take 1 tablet by mouth every 6 (six) hours as needed for severe pain.   permethrin 5 % cream Commonly known as: ELIMITE Apply 1 application topically as needed. Hives and rash on arms and chest   potassium chloride SA 20 MEQ tablet Commonly known as: K-DUR TAKE 3 TABLETS BY MOUTH DAILY.   silver sulfADIAZINE 1 % cream Commonly known as: Silvadene Apply 1 application topically daily.   simvastatin 40 MG tablet Commonly known as: ZOCOR Take 1 tablet (40 mg total) by mouth at bedtime.   spironolactone 25 MG tablet Commonly known as: ALDACTONE TAKE 1 TABLET BY MOUTH ONCE DAILY   SYRINGE-NEEDLE (DISP) 3 ML 23G X 1" 3 ML Misc Use with B12 to inject IM every 30 days What changed: Another medication with the same name was added. Make sure you understand how and when to take each.   B-D 3CC LUER-LOK SYR 23GX1-1/2 23G X 1-1/2" 3 ML Misc Generic drug: SYRINGE-NEEDLE (DISP) 3 ML USE TO INJECT B-12 ONCE MONTHLY What changed: You were already taking a medication with the same name, and this prescription was added. Make sure you understand how and when to take each. Replaces: NEEDLE (DISP) 23 G 23G X 1-1/2" Misc       Follow-up Information    Billey Gosling  J, MD. Schedule an appointment as soon as possible for a visit in 1 week(s).   Specialty: Internal Medicine Contact information: [place] [address] [phone]        [name], MD. Schedule an appointment as soon as possible for a visit in 1 week(s).   Specialty: Orthopedic Surgery Contact information: [place] [address] [phone]           Major procedures and Radiology Reports - PLEASE review detailed and final reports thoroughly  -      Dg Ankle 2 Views Left  Result Date: 08/20/2018 CLINICAL DATA:  Acute left ankle pain without known injury. EXAM: LEFT ANKLE - 2 VIEW COMPARISON:   None. FINDINGS: There is no evidence of fracture, dislocation, or joint effusion. There is no evidence of arthropathy or other focal bone abnormality. Soft tissues are unremarkable. IMPRESSION: Negative. Electronically Signed   By: Marijo Conception M.D.   On: 08/20/2018 15:27   US Renal  Result Date: 08/16/2018 CLINICAL DATA:  Acute renal failure. EXAM: RENAL / URINARY TRACT ULTRASOUND COMPLETE COMPARISON:  None. FINDINGS: Right Kidney: Renal measurements: 9.7 x 4.4 x 3.9 cm = volume: 88 mL . Echogenicity within normal limits. No mass or hydronephrosis visualized. Left Kidney: Renal measurements: 10.4 x 5.4 x 5.0 cm = volume: 47 mL. Echogenicity within normal limits. No mass or hydronephrosis visualized. Bladder: Appears normal for degree of bladder distention. Bilateral ureteral jets are noted. Calculated volume of 142 mL. IMPRESSION: Normal renal ultrasound. Electronically Signed   By: Marijo Conception M.D.   On: 08/16/2018 12:09   Dg Chest Portable 1 View  Result Date: 08/15/2018 CLINICAL DATA:  Reason for exam: fluid retention/abnormal lung examPatient reports sob, wheezing, slight cough for approximately 1 week. Denies any chest pains. Patient had a fall in January now has sore to right anterior tibia. Patient reports multiple surgeries to site since January. Hx of diabetes, htn, chf, cad, asthma. Patient denies any prior heart or lung surgeries. EXAM: PORTABLE CHEST 1 VIEW COMPARISON:  06/01/2018 and earlier exams. FINDINGS: Cardiac silhouette is normal in size. No mediastinal or hilar masses. No evidence of adenopathy. Clear lungs.  No pleural effusion or pneumothorax. Skeletal structures are grossly intact. IMPRESSION: No active disease. Electronically Signed   By: Lajean Manes M.D.   On: 08/15/2018 11:11   Vas [place] Lower Extremity Venous (dvt) (only [facility])  Result Date: 08/15/2018  Lower Venous Study Indications: Edema.  Limitations: Poor ultrasound/tissue interface and body habitus. Comparison  Study: prior study done 06/01/18 Performing Technologist: Abram Sander RVS  Examination Guidelines: A complete evaluation includes B-mode imaging, spectral Doppler, color Doppler, and power Doppler as needed of all accessible portions of each vessel. Bilateral testing is considered an integral part of a complete examination. Limited examinations for reoccurring indications may be performed as noted.  +---------+---------------+---------+-----------+----------+--------------+  RIGHT     Compressibility Phasicity Spontaneity Properties Summary         +---------+---------------+---------+-----------+----------+--------------+  CFV       Full            Yes       Yes                                    +---------+---------------+---------+-----------+----------+--------------+  SFJ       Full                                                             +---------+---------------+---------+-----------+----------+--------------+  FV Prox   Full                                                             +---------+---------------+---------+-----------+----------+--------------+  FV Mid    Full                                                             +---------+---------------+---------+-----------+----------+--------------+  FV Distal                                                  Not visualized  +---------+---------------+---------+-----------+----------+--------------+  PFV       Full                                                             +---------+---------------+---------+-----------+----------+--------------+  POP       Full            Yes       Yes                                    +---------+---------------+---------+-----------+----------+--------------+  PTV                                                        Not visualized  +---------+---------------+---------+-----------+----------+--------------+  PERO                                                       Not visualized   +---------+---------------+---------+-----------+----------+--------------+   +---------+---------------+---------+-----------+----------+--------------+  LEFT      Compressibility Phasicity Spontaneity Properties Summary         +---------+---------------+---------+-----------+----------+--------------+  CFV       Full            Yes       Yes                                    +---------+---------------+---------+-----------+----------+--------------+  SFJ       Full                                                             +---------+---------------+---------+-----------+----------+--------------+  FV Prox   Full                                                             +---------+---------------+---------+-----------+----------+--------------+  FV Mid    Full                                                             +---------+---------------+---------+-----------+----------+--------------+  FV Distal Full                                                             +---------+---------------+---------+-----------+----------+--------------+  PFV       Full                                                             +---------+---------------+---------+-----------+----------+--------------+  POP       Full            Yes       Yes                                    +---------+---------------+---------+-----------+----------+--------------+  PTV       Full                                                             +---------+---------------+---------+-----------+----------+--------------+  PERO                                                       Not visualized  +---------+---------------+---------+-----------+----------+--------------+     Summary: Right: There is no evidence of deep vein thrombosis in the lower extremity. However, portions of this examination were limited- see technologist comments above. No cystic structure found in the popliteal fossa. Left: There is no evidence of deep vein thrombosis  in the lower extremity. However, portions of this examination were limited- see technologist comments above. No cystic structure found in the popliteal fossa.  *See table(s) above for measurements and observations. Electronically signed by Monica Martinez MD on 08/15/2018 at 5:25:46 PM.    Final     Micro Results      Recent Results (from the past 240 hour(s))  Culture, blood (routine x 2)     Status: None   Collection Time: 08/15/18 12:03 PM   Specimen: BLOOD  Result Value  Ref Range Status   Specimen Description BLOOD RIGHT ANTECUBITAL  Final   Special Requests   Final    BOTTLES DRAWN AEROBIC AND ANAEROBIC Blood Culture adequate volume   Culture   Final    NO GROWTH 5 DAYS Performed at [facility], [address] [place], [place] [address]    Report Status 08/20/2018 FINAL  Final  SARS Coronavirus 2 (CEPHEID - Performed in [place] hospital lab), Hosp Order     Status: None   Collection Time: 08/15/18  4:55 PM   Specimen: Nasopharyngeal Swab  Result Value Ref Range Status   SARS Coronavirus 2 NEGATIVE NEGATIVE Final    Comment: (NOTE) If result is NEGATIVE SARS-CoV-2 target nucleic acids are NOT DETECTED. The SARS-CoV-2 RNA is generally detectable in upper and lower  respiratory specimens during the acute phase of infection. The lowest  concentration of SARS-CoV-2 viral copies this assay can detect is 250  copies / mL. A negative result does not preclude SARS-CoV-2 infection  and should not be used as the sole basis for treatment or other  patient management decisions.  A negative result may occur with  improper specimen collection / handling, submission of specimen other  than nasopharyngeal swab, presence of viral mutation(s) within the  areas targeted by this assay, and inadequate number of viral copies  (<250 copies / mL). A negative result must be combined with clinical  observations, patient history, and epidemiological information. If result is  POSITIVE SARS-CoV-2 target nucleic acids are DETECTED. The SARS-CoV-2 RNA is generally detectable in upper and lower  respiratory specimens dur ing the acute phase of infection.  Positive  results are indicative of active infection with SARS-CoV-2.  Clinical  correlation with patient history and other diagnostic information is  necessary to determine patient infection status.  Positive results do  not rule out bacterial infection or co-infection with other viruses. If result is PRESUMPTIVE POSTIVE SARS-CoV-2 nucleic acids MAY BE PRESENT.   A presumptive positive result was obtained on the submitted specimen  and confirmed on repeat testing.  While 2019 novel coronavirus  (SARS-CoV-2) nucleic acids may be present in the submitted sample  additional confirmatory testing may be necessary for epidemiological  and / or clinical management purposes  to differentiate between  SARS-CoV-2 and other Sarbecovirus currently known to infect humans.  If clinically indicated additional testing with an alternate test  methodology [id number]) is advised. The SARS-CoV-2 RNA is generally  detectable in upper and lower respiratory sp ecimens during the acute  phase of infection. The expected result is Negative. Fact Sheet for Patients:  StrictlyIdeas.no Fact Sheet for Healthcare Providers: BankingDealers.co.za This test is not yet approved or cleared by the [place] FDA and has been authorized for detection and/or diagnosis of SARS-CoV-2 by FDA under an Emergency Use Authorization (EUA).  This EUA will remain in effect (meaning this test can be used) for the duration of the COVID-19 declaration under [name] 564(b)(1) of the Act, 21 U.S.C. [name] 360bbb-3(b)(1), unless the authorization is terminated or revoked sooner. Performed at [facility], [place] [address], [place],  [address]   Culture, blood (Routine X 2) w Reflex to ID Panel      Status: None   Collection Time: 08/15/18 10:29 PM   Specimen: BLOOD RIGHT HAND  Result Value Ref Range Status   Specimen Description BLOOD RIGHT HAND  Final   Special Requests   Final    BOTTLES DRAWN AEROBIC ONLY Blood Culture  adequate volume   Culture   Final    NO GROWTH 5 DAYS Performed at [facility], [place] [address], [place], [place] [address]    Report Status 08/20/2018 FINAL  Final  MRSA PCR Screening     Status: None   Collection Time: 08/16/18 10:47 AM   Specimen: Nasal Mucosa; Nasopharyngeal  Result Value Ref Range Status   MRSA by PCR NEGATIVE NEGATIVE Final    Comment:        The GeneXpert MRSA Assay (FDA approved for NASAL specimens only), is one component of a comprehensive MRSA colonization surveillance program. It is not intended to diagnose MRSA infection nor to guide or monitor treatment for MRSA infections. Performed at [facility], [place] [address], [place], [place] [address]     Today   Subjective    April [name] today has no headache,no chest abdominal pain,no new weakness tingling or numbness, feels much better wants to go home today.     Objective   Blood pressure 125/65, pulse 71, temperature 98.8 F (37.1 C), temperature source Oral, resp. rate 16, height 5\' 4"  (1.626 m), weight (!) 158 kg, SpO2 95 %.   Intake/Output Summary (Last 24 hours) at 08/23/2018 1037 Last data filed at 08/23/2018 0900 Gross per 24 hour  Intake 720 ml  Output 1100 ml  Net -380 ml    Exam  Awake Alert, Oriented x 3, No new F.N deficits, Normal affect [place].AT,PERRAL Supple Neck,No JVD, No cervical lymphadenopathy appriciated.  Symmetrical Chest wall movement, Good air movement bilaterally, CTAB RRR,No Gallops,Rubs or new Murmurs, No Parasternal Heave +ve B.Sounds, Abd Soft, Non tender, No organomegaly appriciated, No rebound -guarding or rigidity. No Cyanosis, Clubbing or edema, No new Rash or bruise   Data Review   CBC w Diff:  Lab Results   Component Value Date   WBC 5.6 08/20/2018   HGB 9.3 (L) 08/20/2018   HCT 30.3 (L) 08/20/2018   PLT 161 08/20/2018   LYMPHOPCT 20 08/15/2018   MONOPCT 10 08/15/2018   EOSPCT 8 08/15/2018   BASOPCT 2 08/15/2018    CMP:  Lab Results  Component Value Date   NA 138 08/20/2018   NA 145 (H) 10/25/2012   K 3.7 08/20/2018   CL 99 08/20/2018   CO2 29 08/20/2018   BUN 23 08/20/2018   BUN 24 10/25/2012   CREATININE 1.77 (H) 08/20/2018   PROT 6.5 08/15/2018   PROT 6.4 10/25/2012   ALBUMIN 3.3 (L) 08/15/2018   ALBUMIN 4.1 10/25/2012   BILITOT 0.6 08/15/2018   ALKPHOS 74 08/15/2018   AST 20 08/15/2018   ALT 15 08/15/2018  .   Total Time in preparing paper work, data evaluation and todays exam - 53 minutes  Lala Lund M.D on 08/23/2018 at 10:37 AM  [facility]   Office  [phone]

## 2018-08-23 NOTE — Progress Notes (Signed)
Occupational Therapy Treatment Patient Details Name: April [name] MRN: [id number] DOB: 04-12-1950 Today's Date: 08/23/2018    History of present illness April [name] is an 68 y.o. female with past medical history significant for morbid obesity, chronic right lower extremity wound, diabetes mellitus complicated by severe neuropathy, chronic kidney disease, hypertension who was in her usual state of health until 4 days prior to admission when she had onset of the "stinging, electric shocks, needles and stabbing pain" in her legs bilaterally.   OT comments  Pt progressing toward OT goals and agreeable to today's session. Pt demonstrated supervision for sit to stand transfer for ADLs with no physical assistance required Min A for safety with mobility. Pt required set up for grooming at sink with BSC required due to decreased standing tolerance. Bariatric 3n1 recommended for home setting due to pt's concerns of toilet set up at home. Pt will benefit from continued therapy in home setting to ensure safety in ADLs and Maximized independence in home environment. OT will continue to follow acutely.    Follow Up Recommendations  SNF;Supervision/Assistance - 24 hour;Home health OT    Equipment Recommendations  3 in 1 bedside commode(bariatric)    Recommendations for Other Services      Precautions / Restrictions Precautions Precautions: Fall Precaution Comments: pt denies recent falls Restrictions Weight Bearing Restrictions: No       Mobility Bed Mobility Overal bed mobility: Needs Assistance Bed Mobility: Supine to Sit     Supine to sit: Supervision     General bed mobility comments: pt received in chair.   Transfers Overall transfer level: Needs assistance Equipment used: Rolling walker (2 wheeled) Transfers: Sit to/from Stand Sit to Stand: Supervision         General transfer comment: sit to stand transfer performed in chair and BSC no physical assistance required  supervision for safety.     Balance Overall balance assessment: Needs assistance   Sitting balance-Leahy Scale: Good     Standing balance support: Bilateral upper extremity supported;During functional activity Standing balance-Leahy Scale: Fair Standing balance comment: instability noted when entering restroom.                            ADL either performed or assessed with clinical judgement   ADL Overall ADL's : Needs assistance/impaired     Grooming: Set up;Sitting;Wash/dry hands;Wash/dry face;Oral care Grooming Details (indicate cue type and reason): seated on BSC by sink to emulate home setting.                  Toilet Transfer: Minimal Print production planner Details (indicate cue type and reason): no physical assistance required to power up to stand with transfer from chair to [facility] in front of sink. Min A required safety due to patient reporting feeling dizzy and demonstrated some instabilty.          Functional mobility during ADLs: Minimal assistance;Rolling walker General ADL Comments: When pt transferred to restroom and had to step to enter pt reports dizziness, and instability noted. Support required with functional transfers to ensure safety in home setting .     Vision       Perception     Praxis      Cognition Arousal/Alertness: Awake/alert Behavior During Therapy: WFL for tasks assessed/performed Overall Cognitive Status: Within Functional Limits for tasks assessed  General Comments: Very determined to work through pain and progress        Exercises     Shoulder Instructions       General Comments      Pertinent Vitals/ Pain       Pain Assessment: Faces Faces Pain Scale: Hurts a little bit Pain Location: generally on legs Pain Descriptors / Indicators: Aching;Guarding;Discomfort Pain Intervention(s): Monitored during session;Repositioned  Home Living                                           Prior Functioning/Environment              Frequency  Min 2X/week        Progress Toward Goals  OT Goals(current goals can now be found in the care plan section)  Progress towards OT goals: Progressing toward goals  Acute Rehab OT Goals Patient Stated Goal: To go home OT Goal Formulation: With patient Time For Goal Achievement: 08/31/18 Potential to Achieve Goals: Fair ADL Goals Pt Will Transfer to Toilet: stand pivot transfer;bedside commode;with min guard assist Pt Will Perform Toileting - Clothing Manipulation and hygiene: sit to/from stand;with min guard assist Additional ADL Goal #1: Pt will tolerate standing with min guard assist x 2 minutes with RW.  Plan Frequency remains appropriate;Discharge plan needs to be updated    Co-evaluation                 AM-PAC OT "6 Clicks" Daily Activity     Outcome Measure   Help from another person eating meals?: A Little Help from another person taking care of personal grooming?: A Little Help from another person toileting, which includes using toliet, bedpan, or urinal?: Total Help from another person bathing (including washing, rinsing, drying)?: A Lot Help from another person to put on and taking off regular upper body clothing?: A Little Help from another person to put on and taking off regular lower body clothing?: Total 6 Click Score: 13    End of Session Equipment Utilized During Treatment: Gait belt;Rolling walker  OT Visit Diagnosis: Pain;Muscle weakness (generalized) (M62.81)   Activity Tolerance Patient limited by pain   Patient Left with call bell/phone within reach;in chair   Nurse Communication Mobility status        Time: 4259-5638 OT Time Calculation (min): 31 min  Charges: OT General Charges $OT Visit: 1 Visit OT Treatments $Self Care/Home Management : 23-37 mins  Minus Breeding, MSOT, OTR/L  Supplemental Rehabilitation  Services  [phone]    Marius Ditch 08/23/2018, 12:51 PM

## 2018-08-23 NOTE — TOC Initial Note (Signed)
Transition of Care [facility]) - Initial/Assessment Note    Patient Details  Name: April [name] MRN: [id number] Date of Birth: 11/14/1950  [facility] CM/SW Contact:    Alberteen Sam, [place] Phone Number: [phone] 08/23/2018, 10:15 AM  Clinical Narrative:                  Patient's insurance denied CIR and patient declining SNF, patient wishes to go home with home health. Patient reports previously being set up with [facility] and would like to continue services. CSW has followed up with [facility], they can see patient and continue Home Health PT and Nursing. CSW requested orders from MD.   Paulita Cradle working on 3 in 1 for patient through [place], as she is requesting bariatric 3 in1, her current one is small she reports. She reports she already has a walker and identifies no further equipment needs at this time.  Expected Discharge Plan: [place] Barriers to Discharge: No Barriers Identified   Patient Goals and CMS Choice Patient states their goals for this hospitalization and ongoing recovery are:: to go home CMS Medicare.gov Compare Post Acute Care list provided to:: Patient Choice offered to / list presented to : Patient  Expected Discharge Plan and Services Expected Discharge Plan: [place] Choice: [place] arrangements for the past 2 months: Single Family Home                 DME Arranged: 3-N-1 DME Agency: [employer] Date DME Agency Contacted: 08/23/18 Time DME Agency Contacted: 1000 Representative spoke with at DME Agency: [place] Arranged: PT, RN [place] Agency: [facility] [place] Date [place]: 08/23/18 Time HH Agency Contacted: 7 Representative spoke with at [place] Arrangements/Services Living arrangements for the past 2 months: [place] with:: Self Patient language and need for interpreter reviewed:: Yes Do you feel safe going  back to the place where you live?: Yes      Need for Family Participation in Patient Care: No (Comment) Care giver support system in place?: No (comment) Current home services: Home RN, Home PT Criminal Activity/Legal Involvement Pertinent to Current Situation/Hospitalization: No - Comment as needed  Activities of Daily Living Home Assistive Devices/Equipment: Bedside commode/3-in-1, Walker (specify type) ADL Screening (condition at time of admission) Patient's cognitive ability adequate to safely complete daily activities?: Yes Is the patient deaf or have difficulty hearing?: No Does the patient have difficulty seeing, even when wearing glasses/contacts?: No Does the patient have difficulty concentrating, remembering, or making decisions?: No Patient able to express need for assistance with ADLs?: Yes Does the patient have difficulty dressing or bathing?: Yes Independently performs ADLs?: Yes (appropriate for developmental age) Does the patient have difficulty walking or climbing stairs?: Yes Weakness of Legs: Both Weakness of Arms/Hands: None  Permission Sought/Granted Permission sought to share information with : Case Manager, Family Supports, Investment banker, corporate granted to share info w AGENCY: Home Health        Emotional Assessment Appearance:: Appears stated age Attitude/Demeanor/Rapport: Gracious Affect (typically observed): Calm Orientation: : Oriented to Self, Oriented to Place, Oriented to  Time, Oriented to Situation Alcohol / Substance Use: Not Applicable Psych Involvement: No (comment)  Admission diagnosis:  Leg swelling [M79.89] Patient Active Problem List   Diagnosis Date Noted  . Intractable pain 08/17/2018  . Acute on chronic  kidney failure ([place]) 08/15/2018  . Onychomycosis 05/19/2018  . Idiopathic chronic venous hypertension of right lower extremity with ulcer and inflammation ([place]) 05/19/2018  . Traumatic wound dehiscence   .  Skin lumps 01/14/2018  . Bilateral leg cramps 12/14/2017  . Dry skin dermatitis 06/29/2017  . Left shoulder pain 08/14/2016  . Meralgia paresthetica 07/16/2016  . Chronic left-sided low back pain with left-sided sciatica 06/02/2016  . Whole body pain 03/20/2016  . Diabetic peripheral neuropathy ([place]) 03/20/2016  . Osteopenia 01/11/2016  . CKD (chronic kidney disease) stage 3, GFR 30-59 ml/min (HCC) 01/02/2016  . Hand paresthesia 07/18/2015  . Carpal tunnel syndrome 06/07/2015  . Family history of colon cancer   . Diabetes mellitus with neurological manifestations ([place]) 04/18/2015  . Bilateral leg edema 04/11/2015  . Abnormality of gait 01/03/2015  . Hereditary and idiopathic peripheral neuropathy 01/03/2015  . GERD (gastroesophageal reflux disease) 06/17/2014  . Hx of colonic polyps 12/15/2012  . Vitamin B12 deficiency 11/03/2012  . Intrinsic asthma 03/23/2012  . Chronic diastolic heart failure ([place]) 02/20/2011  . OSA (obstructive sleep apnea) 09/17/2010  . URINARY URGENCY 01/08/2010  . CAD, NATIVE VESSEL 11/20/2008  . OSTEOARTHRITIS, KNEES, BILATERAL, SEVERE 06/14/2008  . Hyperlipidemia 05/10/2007  . Essential hypertension 01/18/2007  . HYPOKALEMIA 04/30/2006  . Body mass index 60.0-69.9, adult ([place]) 04/30/2006  . HX, PERSONAL, PEPTIC ULCER DISEASE 04/30/2006   PCP:  [name], MD Pharmacy:   [facility] [place], [place] - [address] [place] [address] Phone: [phone] Fax: [phone]     Social Determinants of Health (SDOH) Interventions    Readmission Risk Interventions No flowsheet data found.

## 2018-08-23 NOTE — Progress Notes (Signed)
Physical Therapy Treatment Patient Details Name: April [name] MRN: [id number] DOB: 1950/12/12 Today's Date: 08/23/2018    History of Present Illness Silvanna Page Mowers is an 68 y.o. female with past medical history significant for morbid obesity, chronic right lower extremity wound, diabetes mellitus complicated by severe neuropathy, chronic kidney disease, hypertension who was in her usual state of health until 4 days prior to admission when she had onset of the "stinging, electric shocks, needles and stabbing pain" in her legs bilaterally.    PT Comments    Pt supine in bed on arrival.  She is very discouraged about CIR denial but remains motivated to progress.  During today's session she performed all task with supervision and increased time.  Pt will require HHPT and [place] aide based on CIR denial at this time.     Follow Up Recommendations  CIR     Equipment Recommendations  Rolling walker with 5" wheels    Recommendations for Other Services       Precautions / Restrictions Precautions Precautions: Fall Precaution Comments: pt denies recent falls Restrictions Weight Bearing Restrictions: No    Mobility  Bed Mobility Overal bed mobility: Needs Assistance Bed Mobility: Supine to Sit     Supine to sit: Supervision     General bed mobility comments: Increased time and effort to move to edge of bed with heavy reliance on railings.  Transfers Overall transfer level: Needs assistance Equipment used: Rolling walker (2 wheeled) Transfers: Sit to/from Stand Sit to Stand: Supervision         General transfer comment: Bed slightly elevated and with rocking momentum she was able to achieve standing unassisted.  Pt presents with poor eccentric loading when returning to seated surface.  Ambulation/Gait Ambulation/Gait assistance: Supervision Gait Distance (Feet): 30 Feet Assistive device: Rolling walker (2 wheeled) Gait Pattern/deviations: Step-through pattern;Wide base  of support Gait velocity: reduced   General Gait Details: Pt with slow waddling pattern, required cues for progression of gt training and upper trunk control.   Stairs             Wheelchair Mobility    Modified Rankin (Stroke Patients Only)       Balance Overall balance assessment: Needs assistance   Sitting balance-Leahy Scale: Good       Standing balance-Leahy Scale: Fair                              Cognition Arousal/Alertness: Awake/alert Behavior During Therapy: WFL for tasks assessed/performed Overall Cognitive Status: Within Functional Limits for tasks assessed                                 General Comments: Very determined to work through pain and progress      Exercises      General Comments        Pertinent Vitals/Pain Pain Assessment: Faces Faces Pain Scale: Hurts a little bit Pain Location: generally on legs Pain Descriptors / Indicators: Aching;Guarding;Discomfort Pain Intervention(s): Monitored during session;Repositioned    Home Living                      Prior Function            PT Goals (current goals can now be found in the care plan section) Acute Rehab PT Goals Patient Stated Goal: To go home Potential to Achieve  Goals: Good Progress towards PT goals: Progressing toward goals    Frequency    Min 2X/week      PT Plan Current plan remains appropriate    Co-evaluation              AM-PAC PT "6 Clicks" Mobility   Outcome Measure  Help needed turning from your back to your side while in a flat bed without using bedrails?: A Little Help needed moving from lying on your back to sitting on the side of a flat bed without using bedrails?: A Little Help needed moving to and from a bed to a chair (including a wheelchair)?: A Little Help needed standing up from a chair using your arms (e.g., wheelchair or bedside chair)?: A Little Help needed to walk in hospital room?: A  Little Help needed climbing 3-5 steps with a railing? : A Little 6 Click Score: 18    End of Session Equipment Utilized During Treatment: Gait belt Activity Tolerance: Patient tolerated treatment well Patient left: in chair;with call bell/phone within reach(OT to dovetail session) Nurse Communication: Mobility status PT Visit Diagnosis: Difficulty in walking, not elsewhere classified (R26.2);Pain Pain - Right/Left: Right Pain - part of body: Ankle and joints of foot     Time: 3606-7703 PT Time Calculation (min) (ACUTE ONLY): 20 min  Charges:  $Gait Training: 8-22 mins                     Governor Rooks, PTA Acute Rehabilitation Services Pager [phone] Office [phone]     Marithza Malachi Eli Hose 08/23/2018, 11:34 AM

## 2018-08-23 NOTE — Progress Notes (Signed)
Inpatient Rehabilitation Admissions Coordinator  Insurance has denied an inpt rehab admission. I spoke with patient at bedside and she is aware. She refuses SNF. She wishes to prepare for d/c home later today. I have notified SW, Caryl Pina as well as Dr. Candiss Norse. I will sign off at this time.  Danne Baxter, RN, MSN Rehab Admissions Coordinator [phone] 08/23/2018 9:46 AM

## 2018-08-23 NOTE — Progress Notes (Signed)
Provided discharge education/instructions, all questions and concerns addressed, Pt not in distress, bariatric commode will be delivered to Pt's house, to discharge home with all belongings accompanied by husband.

## 2018-08-23 NOTE — Plan of Care (Signed)

## 2018-08-24 ENCOUNTER — Telehealth: Payer: Self-pay | Admitting: *Deleted

## 2018-08-24 ENCOUNTER — Telehealth: Payer: Self-pay | Admitting: Internal Medicine

## 2018-08-24 NOTE — Telephone Encounter (Signed)
Transition Care Management Follow-up Telephone Call   Date discharged? 08/23/18   How have you been since you were released from the hospital? Pt states she is doing alright..   Do you understand why you were in the hospital? YES   Do you understand the discharge instructions? YES   Where were you discharged to? Home   Items Reviewed:  Medications reviewed: YES, pt states her medications are the same  Allergies reviewed: YES  Dietary changes reviewed: YES, heart healthy  Referrals reviewed: YES, she states she has appt w/dr. [name] on Monday 7/27   Functional Questionnaire:   Activities of Daily Living (ADLs):   She states she are independent in the following: feeding, continence, grooming and toileting States they require assistance with the following: ambulation, bathing and hygiene and dressing   Any transportation issues/concerns?: NO   Any patient concerns? NO   Confirmed importance and date/time of follow-up visits scheduled YES, appt 08/26/18  Provider Appointment booked with Dr Quay Burow  Confirmed with patient if condition begins to worsen call PCP or go to the ER.  Patient was given the office number and encouraged to call back with question or concerns.  : YES

## 2018-08-24 NOTE — Telephone Encounter (Signed)
Gave ok to resume care per Dr. Quay Burow.

## 2018-08-24 NOTE — Telephone Encounter (Signed)
Dorian Pod was called and given VO for patient and stated that she will be doing the dressing changes next week.

## 2018-08-24 NOTE — Telephone Encounter (Signed)
Dorian Pod with [facility] is with pt now and is needing VO today on wound care to RLE and compression wrap to verify that you are wanting this to be done 2x/week. Please call back at [phone]

## 2018-08-24 NOTE — Telephone Encounter (Signed)
Dorian Pod calling to report pt has been discharged from hospital and they will resume care. New start of care - no changes. She does need a verbal on that.

## 2018-08-25 NOTE — Progress Notes (Signed)
Subjective:    Patient ID: April [name], female    DOB: 10/27/1950, 68 y.o.   MRN: [id number]  HPI The patient is here for follow up from the hospital.  Admitted 08/15/18 - 08/23/18  4 days prior to admission she had sudden onset of stinging, electric shocks, needles and stabbing pain in both legs.  She was diagnosed with ARF, chronic RLE wound w/o any infection, neuropathic pain.   ARF on CKD, stage 3: GFR at 23 upon admission, cr 2.45 likely overdiuresis - lasix dose decreased to 80 mg BID with improvement in GFR Renal US stable GFR back to baseline She is taking the lasix as prescribed. She denies increased swelling since discharge. She has home health nurse coming twice - three times a week to wrap her legs.  Severe diabetic neuropathy: Significant neuropathy - follows with Dr [name] Has been on several medications Oxcarbazepine inc to 300 mg BID on 7/16 Low dose neurontin 100 mg TD added Her neuropathy pain is better.  She still has difficulty walking due to neuropathic pain.    Chronic RLE ulcer: Follows with Dr [name] No evidence of infection Afebrile, no leukocytosis Had one dose of Vanco in ED only Blood cultures negative LE [place] neg for DVT Wound care, outpatient f/u with Dr [name] - she has an appointment on Monday, three days from now.   Diabetes: Diet controlled  Hypertension: Controlled on lasix and coreg. She is taking her medications daily.    HFpEF: Compensated Continued on coreg, lasix, aldactone Her SOB is unchanged.  She denies any significant change in leg swelling.    COPD: Stable Montelukast, dulera, albuterol prn She has wheezing and SOB both of which are chronic and unchanged.  She denies cough, fever.    Severe morbid obesity, BMI 60: It was stressed to make aggressive lifestyle changes Again, stressed to her the only thing that will help with the leg swelling is weight loss and keeping active.    Left ankle pain: Likely acute gout Uric  acid level > 10 - improved after two doses of steroids and colchicine Discharged on colchicine Monitor uric acid levels Continue colchicine daily.   Ankle pain is improved.         Medications and allergies reviewed with patient and updated if appropriate.  Patient Active Problem List   Diagnosis Date Noted  . Intractable pain 08/17/2018  . Acute on chronic kidney failure ([place]) 08/15/2018  . Onychomycosis 05/19/2018  . Idiopathic chronic venous hypertension of right lower extremity with ulcer and inflammation ([place]) 05/19/2018  . Traumatic wound dehiscence   . Skin lumps 01/14/2018  . Bilateral leg cramps 12/14/2017  . Dry skin dermatitis 06/29/2017  . Left shoulder pain 08/14/2016  . Meralgia paresthetica 07/16/2016  . Chronic left-sided low back pain with left-sided sciatica 06/02/2016  . Whole body pain 03/20/2016  . Diabetic peripheral neuropathy ([place]) 03/20/2016  . Osteopenia 01/11/2016  . CKD (chronic kidney disease) stage 3, GFR 30-59 ml/min ([facility]) 01/02/2016  . Hand paresthesia 07/18/2015  . Carpal tunnel syndrome 06/07/2015  . Family history of colon cancer   . Diabetes mellitus with neurological manifestations ([place]) 04/18/2015  . Bilateral leg edema 04/11/2015  . Abnormality of gait 01/03/2015  . Hereditary and idiopathic peripheral neuropathy 01/03/2015  . GERD (gastroesophageal reflux disease) 06/17/2014  . Hx of colonic polyps 12/15/2012  . Vitamin B12 deficiency 11/03/2012  . Intrinsic asthma 03/23/2012  . Chronic diastolic heart failure ([place]) 02/20/2011  . OSA (  obstructive sleep apnea) 09/17/2010  . URINARY URGENCY 01/08/2010  . CAD, NATIVE VESSEL 11/20/2008  . OSTEOARTHRITIS, KNEES, BILATERAL, SEVERE 06/14/2008  . Hyperlipidemia 05/10/2007  . Essential hypertension 01/18/2007  . HYPOKALEMIA 04/30/2006  . Body mass index 60.0-69.9, adult ([place]) 04/30/2006  . HX, PERSONAL, PEPTIC ULCER DISEASE 04/30/2006    Current Outpatient Medications on File Prior  to Visit  Medication Sig Dispense Refill  . albuterol (PROVENTIL HFA) 108 (90 Base) MCG/ACT inhaler Inhale 2 puffs into the lungs every 4 (four) hours as needed for wheezing or shortness of breath. 1 Inhaler 4  . albuterol (PROVENTIL) (2.5 MG/3ML) 0.083% nebulizer solution Take 3 mLs (2.5 mg total) by nebulization every 6 (six) hours as needed for wheezing or shortness of breath. 75 mL 12  . Amino Acids-Protein Hydrolys (FEEDING SUPPLEMENT, PRO-STAT SUGAR FREE 64,) LIQD Take 30 mLs by mouth 3 (three) times daily.    Marland Kitchen aspirin (ECOTRIN LOW STRENGTH) 81 MG EC tablet Take 81 mg by mouth at bedtime.     . budesonide-formoterol (SYMBICORT) 160-4.5 MCG/ACT inhaler one - two inhalations every 12 hours; gargle and spit after use (Patient taking differently: Inhale 2 puffs into the lungs 2 (two) times daily as needed (shortness of breath). gargle and spit after use) 1 Inhaler 4  . calcium carbonate (TUMS - DOSED IN MG ELEMENTAL CALCIUM) 500 MG chewable tablet Chew 2 tablets by mouth daily as needed for indigestion or heartburn.    . carvedilol (COREG) 25 MG tablet TAKE 1 TABLET BY MOUTH TWICE DAILY WITH A MEAL (Patient taking differently: Take 25 mg by mouth daily. ) 60 tablet 5  . COD LIVER OIL PO Take 1 capsule by mouth daily.    . colchicine 0.6 MG tablet Take 1 tablet (0.6 mg total) by mouth daily. 30 tablet 0  . cyanocobalamin (,VITAMIN B-12,) 1000 MCG/ML injection INJECT 1ML INTO MUSCLE EVERY 30 DAYS (Patient taking differently: Inject 1,000 mcg into the muscle every 30 (thirty) days. ) 10 mL 0  . diclofenac sodium (VOLTAREN) 1 % GEL Apply 2 g topically 4 (four) times daily. (Patient taking differently: Apply 2 g topically 4 (four) times daily as needed (pain). ) 100 g 11  . diphenhydrAMINE (BENADRYL) 12.5 MG/5ML liquid Take 25 mg by mouth daily as needed for itching.    . docusate sodium (COLACE) 100 MG capsule Take 1 capsule (100 mg total) by mouth 2 (two) times daily. (Patient taking differently: Take  100 mg by mouth 2 (two) times daily as needed for mild constipation. ) 10 capsule 0  . EPINEPHrine 0.3 mg/0.3 mL IJ SOAJ injection Inject 0.3 mLs (0.3 mg total) into the muscle once as needed for anaphylaxis. 2 mL 1  . famotidine (PEPCID) 20 MG tablet Take 1 tablet (20 mg total) by mouth 2 (two) times daily. 180 tablet 3  . Flaxseed, Linseed, (FLAX SEEDS PO) Take 1 capsule by mouth 2 (two) times a day.    . folic acid (FOLVITE) 474 MCG tablet Take 400 mcg by mouth at bedtime.     . furosemide (LASIX) 80 MG tablet Take 1 tablet (80 mg total) by mouth 2 (two) times daily. 60 tablet 0  . gabapentin (NEURONTIN) 100 MG capsule Take 1 capsule (100 mg total) by mouth 3 (three) times daily. 90 capsule 0  . hydrOXYzine (ATARAX/VISTARIL) 25 MG tablet Take 10 mg by mouth every 6 (six) hours as needed for itching.     . Lancets (ONETOUCH ULTRASOFT) lancets Use to help check  blood sugars twice a day Dx E11.9 100 each 3  . montelukast (SINGULAIR) 10 MG tablet Take 1 tablet (10 mg total) by mouth at bedtime. 30 tablet 5  . Multiple Vitamin (MULTIVITAMIN WITH MINERALS) TABS tablet Take 1 tablet by mouth daily.    . Omega-3 Fatty Acids (FISH OIL) 1000 MG CAPS Take 1,000 mg by mouth daily.     . Oxcarbazepine (TRILEPTAL) 300 MG tablet Take 1 tablet (300 mg total) by mouth 2 (two) times daily. 60 tablet 0  . oxyCODONE-acetaminophen (PERCOCET/ROXICET) 5-325 MG tablet Take 1 tablet by mouth every 6 (six) hours as needed for severe pain. 4 tablet 0  . permethrin (ELIMITE) 5 % cream Apply 1 application topically as needed. Hives and rash on arms and chest    . potassium chloride SA (K-DUR,KLOR-CON) 20 MEQ tablet TAKE 3 TABLETS BY MOUTH DAILY. (Patient taking differently: Take 40 mEq by mouth 2 (two) times daily. ) 270 tablet 1  . silver sulfADIAZINE (SILVADENE) 1 % cream Apply 1 application topically daily. 400 g 0  . simvastatin (ZOCOR) 40 MG tablet Take 1 tablet (40 mg total) by mouth at bedtime. 90 tablet 1  .  spironolactone (ALDACTONE) 25 MG tablet TAKE 1 TABLET BY MOUTH ONCE DAILY (Patient taking differently: Take 25 mg by mouth daily. ) 90 tablet 1  . SYRINGE-NEEDLE, DISP, 3 ML (B-D 3CC LUER-LOK SYR 23GX1-1/2) 23G X 1-1/2" 3 ML MISC USE TO INJECT B-12 ONCE MONTHLY 5 each 1  . SYRINGE-NEEDLE, DISP, 3 ML 23G X 1" 3 ML MISC Use with B12 to inject IM every 30 days 50 each 0   No current facility-administered medications on file prior to visit.     Past Medical History:  Diagnosis Date  . Anemia   . Asthma   . CAD (coronary artery disease)   . Carpal tunnel syndrome   . Cellulitis of both lower extremities 04/11/2015  . CHF (congestive heart failure) ([place])   . Chronic kidney disease    [facility]- followed by [facility]  . Colon polyp, hyperplastic 2007 & 2012  . Complication of anesthesia 1999   svt with renal calculi surgery, no problems since  . CTS (carpal tunnel syndrome)    bilateral  . Diabetes mellitus   . Eczema   . GERD (gastroesophageal reflux disease)   . History of kidney stones 1999  . Hyperlipidemia   . Hypertension   . Leg ulcer ([place]) 04/24/2015   Right lateral leg No evidence of an infection Monitor closely Keep edema controlled   . Leg ulcer ([place])    right lower  . Meralgia paresthetica    Dr. [name]  . Morbid obesity ([place])   . Morbid obesity ([place])   . Neuropathy    toes and legs  . Osteoarthrosis, unspecified whether generalized or localized, lower leg    knee  . PUD (peptic ulcer disease)   . Shortness of breath dyspnea    with exertion  . Sleep apnea    per progress note 02/25/2018  . Type II or unspecified type diabetes mellitus without mention of complication, not stated as uncontrolled   . Unspecified hereditary and idiopathic peripheral neuropathy   . Urticaria   . Vitamin B12 deficiency   . Wears glasses   . Wound cellulitis    right upper leg, healing well    Past Surgical History:  Procedure Laterality Date  . ABDOMINAL HYSTERECTOMY    . CARDIAC  CATHETERIZATION  2002   non  obstructive disease  . colonoscopy with polypectomy  2007 & 2012    hyperplastic ;Dr Watt Climes  . COLONOSCOPY WITH PROPOFOL N/A 06/04/2015   Procedure: COLONOSCOPY WITH PROPOFOL;  Surgeon: Jerene Bears, MD;  Location: [facility];  Service: Gastroenterology;  Laterality: N/A;  . DEBRIDEMENT LEG Right 03/02/2018   WOUND VAC APPLIED  . DEBRIDEMENT LEG Right 05/27/2018   RIGHT LOWER LEG DEBRIDEMENT,  SKIN GRAFT, VAC PLACEMENT   . DILATION AND CURETTAGE OF UTERUS     multiple  . HEMORRHOID SURGERY    . I&D EXTREMITY Right 03/02/2018   Procedure: RIGHT LEG DEBRIDEMENT AND PLACE VAC;  Surgeon: [name], MD;  Location: [facility];  Service: Orthopedics;  Laterality: Right;  . I&D EXTREMITY Right 03/04/2018   Procedure: REPEAT IRRIGATION AND DEBRIDEMENT RIGHT LEG, PLACE WOUND VAC;  Surgeon: [name], MD;  Location: [facility];  Service: Orthopedics;  Laterality: Right;  . I&D EXTREMITY Right 03/30/2018   Procedure: IRRIGATION AND DEBRIDEMENT RIGHT LEG, APPLY WOUND VAC;  Surgeon: [name], MD;  Location: [facility];  Service: Orthopedics;  Laterality: Right;  . I&D EXTREMITY Right 05/27/2018   Procedure: RIGHT LOWER LEG DEBRIDEMENT,  SKIN GRAFT, VAC PLACEMENT;  Surgeon: [name], MD;  Location: [facility];  Service: Orthopedics;  Laterality: Right;  RIGHT LOWER LEG DEBRIDEMENT,  SKIN GRAFT, VAC PLACEMENT  . renal calculi  12/1997   SVT with induction of anesthesia  . right knee arthroscopy    . SKIN SPLIT GRAFT Right 03/04/2018   Procedure: POSSIBLE SPLIT THICKNESS SKIN GRAFT;  Surgeon: [name], MD;  Location: [facility];  Service: Orthopedics;  Laterality: Right;  . SKIN SPLIT GRAFT Right 04/01/2018   Procedure: REPEAT IRRIGATION AND DEBRIDEMENT RIGHT LEG, APPLY SPLIT THICKNESS SKIN GRAFT;  Surgeon: [name], MD;  Location: [facility];  Service: Orthopedics;  Laterality: Right;  . TONSILLECTOMY AND ADENOIDECTOMY      Social History   Socioeconomic History  . Marital  status: Married    Spouse name: [name]  . Number of children: 1  . Years of education: BS  . Highest education level: Not on file  Occupational History  . Occupation: Disabled    Employer: RETIRED  Social Needs  . Financial resource strain: Not on file  . Food insecurity    Worry: Not on file    Inability: Not on file  . Transportation needs    Medical: Not on file    Non-medical: Not on file  Tobacco Use  . Smoking status: Never Smoker  . Smokeless tobacco: Never Used  Substance and Sexual Activity  . Alcohol use: No    Alcohol/week: 0.0 standard drinks  . Drug use: No  . Sexual activity: Not on file  Lifestyle  . Physical activity    Days per week: Not on file    Minutes per session: Not on file  . Stress: Not on file  Relationships  . Social Herbalist on phone: Not on file    Gets together: Not on file    Attends religious service: Not on file    Active member of club or organization: Not on file    Attends meetings of clubs or organizations: Not on file    Relationship status: Not on file  Other Topics Concern  . Not on file  Social History Narrative   Patient lives at home with her husband [name] 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    Caffeine - some times.   Right handed.    Family History  Problem Relation Age of Onset  . Colon cancer Mother   . Prostate cancer Father   . Colon cancer Father   . Diabetes Maternal Aunt   . Breast cancer Maternal Aunt   . Diabetes Maternal Uncle   . Diabetes Paternal Aunt   . Stroke Paternal Aunt        > 65  . Heart disease Paternal Aunt   . Diabetes Paternal Uncle   . Breast cancer Maternal Aunt         X 2  . Breast cancer Cousin     Review of Systems  Constitutional: Negative for fever.  Respiratory: Positive for shortness of breath and wheezing. Negative for cough.   Cardiovascular: Positive for leg swelling. Negative for chest pain and palpitations.  Gastrointestinal:  Negative for abdominal pain and nausea.  Neurological: Negative for light-headedness and headaches.       Objective:   Vitals:   08/26/18 1303  BP: 122/64  Pulse: 80  Resp: 18  Temp: 98.3 F (36.8 C)  SpO2: 92%   BP Readings from Last 3 Encounters:  08/26/18 122/64  08/23/18 (!) 122/58  08/12/18 126/78   Wt Readings from Last 3 Encounters:  08/15/18 (!) 348 lb 5.2 oz (158 kg)  07/26/18 (!) 360 lb (163.3 kg)  06/21/18 (!) 360 lb (163.3 kg)   There is no height or weight on file to calculate BMI.   Physical Exam    Constitutional: Appears well-developed and well-nourished. No distress.  HENT:  Head: Normocephalic and atraumatic.  Neck: Neck supple. No tracheal deviation present. No thyromegaly present.  No cervical lymphadenopathy Cardiovascular: Normal rate, regular rhythm and normal heart sounds.   No murmur heard. No carotid bruit .  No edema Pulmonary/Chest: Effort normal and breath sounds normal. No respiratory distress. No has no wheezes. No rales.  Skin: Skin is warm and dry. Not diaphoretic.  Psychiatric: Normal mood and affect. Behavior is normal.   DG Ankle 2 Views Left CLINICAL DATA:  Acute left ankle pain without known injury.  EXAM: LEFT ANKLE - 2 VIEW  COMPARISON:  None.  FINDINGS: There is no evidence of fracture, dislocation, or joint effusion. There is no evidence of arthropathy or other focal bone abnormality. Soft tissues are unremarkable.  IMPRESSION: Negative.  Electronically Signed   By: Marijo Conception M.D.   On: 08/20/2018 15:27   Lab Results  Component Value Date   WBC 5.6 08/20/2018   HGB 9.3 (L) 08/20/2018   HCT 30.3 (L) 08/20/2018   PLT 161 08/20/2018   GLUCOSE 94 08/20/2018   CHOL 154 07/15/2018   TRIG 89.0 07/15/2018   HDL 42.50 07/15/2018   LDLDIRECT 169.4 05/02/2007   LDLCALC 93 07/15/2018   ALT 15 08/15/2018   AST 20 08/15/2018   NA 138 08/20/2018   K 3.7 08/20/2018   CL 99 08/20/2018   CREATININE 1.77 (H)  08/20/2018   BUN 23 08/20/2018   CO2 29 08/20/2018   TSH 2.10 12/03/2017   INR 1.0 07/04/2006   HGBA1C 6.4 07/15/2018   MICROALBUR 1.0 04/10/2015     Assessment & Plan:    See Problem List for Assessment and Plan of chronic medical problems.

## 2018-08-25 NOTE — Patient Instructions (Addendum)
  Tests ordered today. Your results will be released to MyChart (or called to you) after review.  If any changes need to be made, you will be notified at that same time.    Medications reviewed and updated.  Changes include :   none     Please followup in 6 months   

## 2018-08-26 ENCOUNTER — Other Ambulatory Visit: Payer: Self-pay

## 2018-08-26 ENCOUNTER — Ambulatory Visit (INDEPENDENT_AMBULATORY_CARE_PROVIDER_SITE_OTHER): Payer: Medicare Other | Admitting: Internal Medicine

## 2018-08-26 ENCOUNTER — Ambulatory Visit (INDEPENDENT_AMBULATORY_CARE_PROVIDER_SITE_OTHER)
Admission: RE | Admit: 2018-08-26 | Discharge: 2018-08-26 | Disposition: A | Discharge status: Home / Self Care | Payer: Medicare Other | Source: Ambulatory Visit | Attending: Internal Medicine | Admitting: Internal Medicine

## 2018-08-26 ENCOUNTER — Other Ambulatory Visit (INDEPENDENT_AMBULATORY_CARE_PROVIDER_SITE_OTHER): Payer: Medicare Other

## 2018-08-26 ENCOUNTER — Encounter: Payer: Self-pay | Admitting: Internal Medicine

## 2018-08-26 VITALS — BP 122/64 | HR 80 | Temp 98.3°F | Resp 18

## 2018-08-26 DIAGNOSIS — N183 Chronic kidney disease, stage 3 unspecified: Secondary | ICD-10-CM

## 2018-08-26 DIAGNOSIS — I10 Essential (primary) hypertension: Secondary | ICD-10-CM

## 2018-08-26 DIAGNOSIS — E1140 Type 2 diabetes mellitus with diabetic neuropathy, unspecified: Secondary | ICD-10-CM

## 2018-08-26 DIAGNOSIS — R062 Wheezing: Secondary | ICD-10-CM

## 2018-08-26 DIAGNOSIS — I5032 Chronic diastolic (congestive) heart failure: Secondary | ICD-10-CM

## 2018-08-26 DIAGNOSIS — E1142 Type 2 diabetes mellitus with diabetic polyneuropathy: Secondary | ICD-10-CM

## 2018-08-26 DIAGNOSIS — Z6841 Body Mass Index (BMI) 40.0 and over, adult: Secondary | ICD-10-CM

## 2018-08-26 LAB — BASIC METABOLIC PANEL
BUN: 37 mg/dL — ABNORMAL HIGH (ref 6–23)
CO2: 33 mEq/L — ABNORMAL HIGH (ref 19–32)
Calcium: 9.5 mg/dL (ref 8.4–10.5)
Chloride: 97 mEq/L (ref 96–112)
Creatinine, Ser: 1.74 mg/dL — ABNORMAL HIGH (ref 0.40–1.20)
GFR: 35.18 mL/min — ABNORMAL LOW (ref >60.00)
Glucose, Bld: 102 mg/dL — ABNORMAL HIGH (ref 70–99)
Potassium: 3.6 mEq/L (ref 3.5–5.1)
Sodium: 140 mEq/L (ref 135–145)

## 2018-08-26 LAB — CBC WITH DIFFERENTIAL/PLATELET
Basophils Absolute: 0.1 10*3/uL (ref 0.0–0.1)
Basophils Relative: 1.3 % (ref 0.0–3.0)
Eosinophils Absolute: 0.2 10*3/uL (ref 0.0–0.7)
Eosinophils Relative: 4.6 % (ref 0.0–5.0)
HCT: 32.5 % — ABNORMAL LOW (ref 36.0–46.0)
Hemoglobin: 10.3 g/dL — ABNORMAL LOW (ref 12.0–15.0)
Lymphocytes Relative: 17.4 % (ref 12.0–46.0)
Lymphs Abs: 0.8 10*3/uL (ref 0.7–4.0)
MCHC: 31.7 g/dL (ref 30.0–36.0)
MCV: 91.5 fl (ref 78.0–100.0)
Monocytes Absolute: 0.6 10*3/uL (ref 0.1–1.0)
Monocytes Relative: 12.6 % — ABNORMAL HIGH (ref 3.0–12.0)
Neutro Abs: 3 10*3/uL (ref 1.4–7.7)
Neutrophils Relative %: 64.1 % (ref 43.0–77.0)
Platelets: 234 10*3/uL (ref 150.0–400.0)
RBC: 3.55 Mil/uL — ABNORMAL LOW (ref 3.87–5.11)
RDW: 16.6 % — ABNORMAL HIGH (ref 11.5–15.5)
WBC: 4.7 10*3/uL (ref 4.0–10.5)

## 2018-08-27 NOTE — Assessment & Plan Note (Signed)
Following with neurology Severe neuropathy Taking Neurontin Oxcarbamazepine  increased Continue above

## 2018-08-27 NOTE — Assessment & Plan Note (Signed)
Stressed the importance of weight loss Discussed that she needs to significantly decrease portions and be as active as possible despite how hard it is for her to ambulate She understands that weight loss is the only time that will help many of her chronic medical problems

## 2018-08-27 NOTE — Assessment & Plan Note (Signed)
BP well controlled Current regimen effective and well tolerated Continue current medications at current doses bmp

## 2018-08-27 NOTE — Assessment & Plan Note (Signed)
When she was first admitted she had a acute on chronic kidney failure Improved with decreasing Lasix dose Now on 80 mg twice daily, which is lower than prior to her going into the hospital Appears to be euvolemic, but high risk for fluid accumulating Following with nephrology BMP, CBC

## 2018-08-27 NOTE — Assessment & Plan Note (Signed)
Sugars are well controlled Diet controlled

## 2018-08-27 NOTE — Assessment & Plan Note (Addendum)
Was fluid overloaded and she was admitted Compensated - has chronic LE edema, but appears euvolemic Bmp, cbc, chest x-ray Continue lasix at current dose, which is decreased compared to prior to hospitalization Need to monitor for increasing edema, shortness of breath Encourage daily weights May need nurse to measure legs

## 2018-08-29 ENCOUNTER — Telehealth: Payer: Self-pay | Admitting: Internal Medicine

## 2018-08-29 ENCOUNTER — Other Ambulatory Visit: Payer: Self-pay

## 2018-08-29 ENCOUNTER — Encounter: Payer: Self-pay | Admitting: Orthopedic Surgery

## 2018-08-29 ENCOUNTER — Ambulatory Visit (INDEPENDENT_AMBULATORY_CARE_PROVIDER_SITE_OTHER): Payer: Medicare Other | Admitting: Physician Assistant

## 2018-08-29 VITALS — Ht 64.0 in | Wt 348.0 lb

## 2018-08-29 DIAGNOSIS — N183 Chronic kidney disease, stage 3 unspecified: Secondary | ICD-10-CM

## 2018-08-29 DIAGNOSIS — I5032 Chronic diastolic (congestive) heart failure: Secondary | ICD-10-CM

## 2018-08-29 DIAGNOSIS — E1142 Type 2 diabetes mellitus with diabetic polyneuropathy: Secondary | ICD-10-CM

## 2018-08-29 DIAGNOSIS — L97919 Non-pressure chronic ulcer of unspecified part of right lower leg with unspecified severity: Secondary | ICD-10-CM

## 2018-08-29 DIAGNOSIS — I87331 Chronic venous hypertension (idiopathic) with ulcer and inflammation of right lower extremity: Secondary | ICD-10-CM

## 2018-08-29 DIAGNOSIS — Z945 Skin transplant status: Secondary | ICD-10-CM

## 2018-08-29 MED ORDER — ALLOPURINOL 100 MG PO TABS: 100.0000 mg | ORAL_TABLET | Freq: Every day | ORAL | 1 refills | Status: DC

## 2018-08-29 MED ORDER — OXYCODONE-ACETAMINOPHEN 5-325 MG PO TABS: 1.0000 | ORAL_TABLET | Freq: Four times a day (QID) | ORAL | 0 refills | Status: DC | PRN

## 2018-08-29 MED ORDER — FUROSEMIDE 80 MG PO TABS: ORAL_TABLET | ORAL | 3 refills | Status: DC

## 2018-08-29 NOTE — Telephone Encounter (Signed)
Caller name:  Linus Orn A Relation to pt: RN from [facility]  Call back number: [phone]    Reason for call:  Patient gain 7lbs in one day as per nurse patient experiencing slight swelling in legs and ankle, body pain has not improved since 08/26/2018 with PCP , lungs clear, seeking clinical advice, please advise

## 2018-08-29 NOTE — Telephone Encounter (Signed)
Tracey aware of response below. Will have patient start medications as prescribed.

## 2018-08-29 NOTE — Progress Notes (Signed)
Office Visit Note   Patient: April [name]           Date of Birth: 1950/06/23           MRN: [id number] Visit Date: 08/29/2018              Requested by: [name], MD [place],  [place] [address] PCP: [name], MD  Chief Complaint  Patient presents with  . Right Leg - Routine Post Op    05/27/18 RLE debridement and STSG       HPI: The patient is a 68 year old female who is seen for follow-up of her right lower extremity ulcer.  She underwent a remote split-thickness skin graft back in April.  She is undergoing compression wraps 3 times weekly by home health.  She reports she has been having difficulty with pain of both feet and severe neuropathy in both the feet and the hands.  She reports the pain got so severe that she had to be hospitalized.  She has been started on gabapentin and currently reports she is on 100 mg 3 times a day.  She had previously been on Lyrica but reports that she was developing some cognitive issues on the Lyrica and this had to be discontinued.  Assessment & Plan: Visit Diagnoses:  1. Idiopathic chronic venous hypertension of right lower extremity with ulcer and inflammation (HCC)   2. S/P split thickness skin graft   3. Diabetic peripheral neuropathy ([place])   4. CKD (chronic kidney disease) stage 3, GFR 30-59 ml/min (HCC)   5. Chronic diastolic heart failure (HCC)     Plan: Continue Silvadene to the right lower extremity wound and then Dynaflex or equivalent compression wrap 3 times weekly.  Sent orders with the patient for the 3 times weekly dressing change with cleansing of the feet bilaterally and then applying Aquaphor over the feet prior to placing the compression wraps. She will follow-up here in 3 weeks or sooner should she develop difficulties in the interim.  Follow-Up Instructions: Return in about 3 weeks (around 09/19/2018).   Ortho Exam  Patient is alert, oriented, no adenopathy, well-dressed, normal affect, normal  respiratory effort. The patient continues to have severe bilateral lower extremity edema except where her compression wraps have been in place.  She has no breakdown over either foot.  The right lower extremity anterior calf ulceration is 3.5 x 7 x 0.1 cm with no signs of infection or cellulitis and continued epithelialization from the wound margins has continued to occur.  Will reapply Dynaflex compression wraps bilaterally.  Imaging: No results found.   Labs: Lab Results  Component Value Date   HGBA1C 6.4 07/15/2018   HGBA1C 6.1 (H) 05/27/2018   HGBA1C 6.5 12/03/2017   ESRSEDRATE 42 (H) 08/12/2018   ESRSEDRATE 56 (H) 03/09/2018   ESRSEDRATE 13 06/02/2016   CRP 6.3 (H) 03/09/2018   CRP 2.2 06/02/2016   LABURIC 14.8 (H) 08/20/2018   REPTSTATUS 08/20/2018 FINAL 08/15/2018   GRAMSTAIN  05/27/2018    FEW WBC PRESENT, PREDOMINANTLY PMN MODERATE GRAM POSITIVE RODS RARE GRAM POSITIVE COCCI IN PAIRS    CULT  08/15/2018    NO GROWTH 5 DAYS Performed at [facility], [place] [address] [place], [place] [address]    LABORGA METHICILLIN RESISTANT STAPHYLOCOCCUS AUREUS 05/27/2018     Lab Results  Component Value Date   ALBUMIN 3.3 (L) 08/15/2018   ALBUMIN 3.7 07/15/2018   ALBUMIN 2.2 (L) 03/08/2018  LABURIC 14.8 (H) 08/20/2018    Lab Results  Component Value Date   MG 2.1 08/20/2018   MG 2.1 08/18/2018   MG 2.4 12/14/2017   Lab Results  Component Value Date   VD25OH 47.2 06/02/2016   VD25OH 42 01/23/2011    No results found for: PREALBUMIN CBC EXTENDED Latest Ref Rng & Units 08/26/2018 08/20/2018 08/16/2018  WBC 4.0 - 10.5 K/uL 4.7 5.6 5.7  RBC 3.87 - 5.11 Mil/uL 3.55(L) 3.21(L) 3.42(L)  HGB 12.0 - 15.0 g/dL 10.3(L) 9.3(L) 9.9(L)  HCT 36.0 - 46.0 % 32.5(L) 30.3(L) 31.9(L)  PLT 150.0 - 400.0 K/uL 234.0 161 157  NEUTROABS 1.4 - 7.7 K/uL 3.0 - -  LYMPHSABS 0.7 - 4.0 K/uL 0.8 - -     Body mass index is 59.73 kg/m.  Orders:  No orders of the defined types were  placed in this encounter.  Meds ordered this encounter  Medications  . oxyCODONE-acetaminophen (PERCOCET) 5-325 MG tablet    Sig: Take 1 tablet by mouth every 6 (six) hours as needed for moderate pain or severe pain.    Dispense:  20 tablet    Refill:  0     Procedures: No procedures performed  Clinical Data: No additional findings.  ROS:  All other systems negative, except as noted in the HPI. Review of Systems  Objective: Vital Signs: Ht 5\' 4"  (1.626 m)   Wt (!) 348 lb (157.9 kg)   BMI 59.73 kg/m   Specialty Comments:  No specialty comments available.  PMFS History: Patient Active Problem List   Diagnosis Date Noted  . Intractable pain 08/17/2018  . Acute on chronic kidney failure ([place]) 08/15/2018  . Onychomycosis 05/19/2018  . Idiopathic chronic venous hypertension of right lower extremity with ulcer and inflammation ([place]) 05/19/2018  . Traumatic wound dehiscence   . Skin lumps 01/14/2018  . Bilateral leg cramps 12/14/2017  . Dry skin dermatitis 06/29/2017  . Left shoulder pain 08/14/2016  . Meralgia paresthetica 07/16/2016  . Chronic left-sided low back pain with left-sided sciatica 06/02/2016  . Whole body pain 03/20/2016  . Diabetic peripheral neuropathy ([place]) 03/20/2016  . Osteopenia 01/11/2016  . CKD (chronic kidney disease) stage 3, GFR 30-59 ml/min (HCC) 01/02/2016  . Hand paresthesia 07/18/2015  . Carpal tunnel syndrome 06/07/2015  . Family history of colon cancer   . Diabetes mellitus with neurological manifestations ([place]) 04/18/2015  . Bilateral leg edema 04/11/2015  . Abnormality of gait 01/03/2015  . Hereditary and idiopathic peripheral neuropathy 01/03/2015  . GERD (gastroesophageal reflux disease) 06/17/2014  . Hx of colonic polyps 12/15/2012  . Vitamin B12 deficiency 11/03/2012  . Intrinsic asthma 03/23/2012  . Chronic diastolic heart failure ([place]) 02/20/2011  . OSA (obstructive sleep apnea) 09/17/2010  . URINARY URGENCY 01/08/2010  .  CAD, NATIVE VESSEL 11/20/2008  . OSTEOARTHRITIS, KNEES, BILATERAL, SEVERE 06/14/2008  . Hyperlipidemia 05/10/2007  . Essential hypertension 01/18/2007  . HYPOKALEMIA 04/30/2006  . Body mass index 60.0-69.9, adult ([place]) 04/30/2006  . HX, PERSONAL, PEPTIC ULCER DISEASE 04/30/2006   Past Medical History:  Diagnosis Date  . Anemia   . Asthma   . CAD (coronary artery disease)   . Carpal tunnel syndrome   . Cellulitis of both lower extremities 04/11/2015  . CHF (congestive heart failure) ([place])   . Chronic kidney disease    [facility]- followed by [facility]  . Colon polyp, hyperplastic 2007 & 2012  . Complication of anesthesia 1999   svt with renal calculi surgery, no problems  since  . CTS (carpal tunnel syndrome)    bilateral  . Diabetes mellitus   . Eczema   . GERD (gastroesophageal reflux disease)   . History of kidney stones 1999  . Hyperlipidemia   . Hypertension   . Leg ulcer ([place]) 04/24/2015   Right lateral leg No evidence of an infection Monitor closely Keep edema controlled   . Leg ulcer ([place])    right lower  . Meralgia paresthetica    Dr. [name]  . Morbid obesity ([place])   . Morbid obesity ([place])   . Neuropathy    toes and legs  . Osteoarthrosis, unspecified whether generalized or localized, lower leg    knee  . PUD (peptic ulcer disease)   . Shortness of breath dyspnea    with exertion  . Sleep apnea    per progress note 02/25/2018  . Type II or unspecified type diabetes mellitus without mention of complication, not stated as uncontrolled   . Unspecified hereditary and idiopathic peripheral neuropathy   . Urticaria   . Vitamin B12 deficiency   . Wears glasses   . Wound cellulitis    right upper leg, healing well    Family History  Problem Relation Age of Onset  . Colon cancer Mother   . Prostate cancer Father   . Colon cancer Father   . Diabetes Maternal Aunt   . Breast cancer Maternal Aunt   . Diabetes Maternal Uncle   . Diabetes Paternal Aunt   . Stroke  Paternal Aunt        > 65  . Heart disease Paternal Aunt   . Diabetes Paternal Uncle   . Breast cancer Maternal Aunt         X 2  . Breast cancer Cousin     Past Surgical History:  Procedure Laterality Date  . ABDOMINAL HYSTERECTOMY    . CARDIAC CATHETERIZATION  2002   non obstructive disease  . colonoscopy with polypectomy  2007 & 2012    hyperplastic ;Dr Watt Climes  . COLONOSCOPY WITH PROPOFOL N/A 06/04/2015   Procedure: COLONOSCOPY WITH PROPOFOL;  Surgeon: Jerene Bears, MD;  Location: [facility];  Service: Gastroenterology;  Laterality: N/A;  . DEBRIDEMENT LEG Right 03/02/2018   WOUND VAC APPLIED  . DEBRIDEMENT LEG Right 05/27/2018   RIGHT LOWER LEG DEBRIDEMENT,  SKIN GRAFT, VAC PLACEMENT   . DILATION AND CURETTAGE OF UTERUS     multiple  . HEMORRHOID SURGERY    . I&D EXTREMITY Right 03/02/2018   Procedure: RIGHT LEG DEBRIDEMENT AND PLACE VAC;  Surgeon: [name], MD;  Location: [facility];  Service: Orthopedics;  Laterality: Right;  . I&D EXTREMITY Right 03/04/2018   Procedure: REPEAT IRRIGATION AND DEBRIDEMENT RIGHT LEG, PLACE WOUND VAC;  Surgeon: [name], MD;  Location: [facility];  Service: Orthopedics;  Laterality: Right;  . I&D EXTREMITY Right 03/30/2018   Procedure: IRRIGATION AND DEBRIDEMENT RIGHT LEG, APPLY WOUND VAC;  Surgeon: [name], MD;  Location: [facility];  Service: Orthopedics;  Laterality: Right;  . I&D EXTREMITY Right 05/27/2018   Procedure: RIGHT LOWER LEG DEBRIDEMENT,  SKIN GRAFT, VAC PLACEMENT;  Surgeon: [name], MD;  Location: [facility];  Service: Orthopedics;  Laterality: Right;  RIGHT LOWER LEG DEBRIDEMENT,  SKIN GRAFT, VAC PLACEMENT  . renal calculi  12/1997   SVT with induction of anesthesia  . right knee arthroscopy    . SKIN SPLIT GRAFT Right 03/04/2018   Procedure: POSSIBLE SPLIT THICKNESS SKIN GRAFT;  Surgeon: [name], MD;  Location: [facility];  Service: Orthopedics;  Laterality: Right;  . SKIN SPLIT GRAFT Right 04/01/2018   Procedure: REPEAT  IRRIGATION AND DEBRIDEMENT RIGHT LEG, APPLY SPLIT THICKNESS SKIN GRAFT;  Surgeon: [name], MD;  Location: [facility];  Service: Orthopedics;  Laterality: Right;  . TONSILLECTOMY AND ADENOIDECTOMY     Social History   Occupational History  . Occupation: Disabled    Employer: RETIRED  Tobacco Use  . Smoking status: Never Smoker  . Smokeless tobacco: Never Used  Substance and Sexual Activity  . Alcohol use: No    Alcohol/week: 0.0 standard drinks  . Drug use: No  . Sexual activity: Not on file

## 2018-08-29 NOTE — Telephone Encounter (Signed)
Have her increase her lasix to 120 mg in morning and 80 mg in afternoon.  ( med list updated)   Start allopurinol 100 mg daily for gout  ( sent to [facility])

## 2018-08-30 ENCOUNTER — Other Ambulatory Visit: Payer: Self-pay | Admitting: Emergency Medicine

## 2018-08-30 ENCOUNTER — Other Ambulatory Visit: Payer: Self-pay | Admitting: Orthopedic Surgery

## 2018-08-30 ENCOUNTER — Telehealth: Payer: Self-pay | Admitting: Orthopedic Surgery

## 2018-08-30 ENCOUNTER — Other Ambulatory Visit: Payer: Self-pay | Admitting: Internal Medicine

## 2018-08-30 DIAGNOSIS — L97919 Non-pressure chronic ulcer of unspecified part of right lower leg with unspecified severity: Secondary | ICD-10-CM

## 2018-08-30 DIAGNOSIS — G8929 Other chronic pain: Secondary | ICD-10-CM

## 2018-08-30 DIAGNOSIS — M170 Bilateral primary osteoarthritis of knee: Secondary | ICD-10-CM

## 2018-08-30 DIAGNOSIS — I87331 Chronic venous hypertension (idiopathic) with ulcer and inflammation of right lower extremity: Secondary | ICD-10-CM

## 2018-08-30 DIAGNOSIS — E1142 Type 2 diabetes mellitus with diabetic polyneuropathy: Secondary | ICD-10-CM

## 2018-08-30 MED ORDER — FUROSEMIDE 80 MG PO TABS: ORAL_TABLET | ORAL | 3 refills | Status: DC

## 2018-08-30 MED ORDER — OXYCODONE-ACETAMINOPHEN 5-325 MG PO TABS: 1.0000 | ORAL_TABLET | Freq: Four times a day (QID) | ORAL | 0 refills | Status: DC | PRN

## 2018-08-30 NOTE — Telephone Encounter (Signed)
Did you want to send in rx for this pt for pain medication? Was in the office yesterday.

## 2018-08-30 NOTE — Telephone Encounter (Signed)
Patient called advised the Rx for Percocet was not called into the pharmacy yesterday. The number to contact patient is [phone]

## 2018-08-30 NOTE — Telephone Encounter (Signed)
rx sent

## 2018-08-30 NOTE — Progress Notes (Unsigned)
me

## 2018-08-31 ENCOUNTER — Ambulatory Visit: Payer: Medicare Other

## 2018-08-31 ENCOUNTER — Other Ambulatory Visit: Payer: Self-pay | Admitting: Family

## 2018-08-31 ENCOUNTER — Ambulatory Visit: Payer: Medicare Other | Admitting: Neurology

## 2018-08-31 DIAGNOSIS — I87331 Chronic venous hypertension (idiopathic) with ulcer and inflammation of right lower extremity: Secondary | ICD-10-CM

## 2018-08-31 MED ORDER — OXYCODONE-ACETAMINOPHEN 5-325 MG PO TABS: 1.0000 | ORAL_TABLET | Freq: Four times a day (QID) | ORAL | 0 refills | Status: DC | PRN

## 2018-09-05 ENCOUNTER — Ambulatory Visit: Payer: Self-pay | Admitting: *Deleted

## 2018-09-05 NOTE — Telephone Encounter (Signed)
Called pt and clarified fluid pill dose. Pt expressed understanding.

## 2018-09-05 NOTE — Telephone Encounter (Signed)
Summary: fluid pill question   Patient would like a call back to get clarification on her "fluid pill" dosage. Doe snot know what it's called.       Reason for Disposition . Caller has medication question only, adult not sick, and triager answers question  Answer Assessment - Initial Assessment Questions 1.   NAME of MEDICATION: "What medicine are you calling about?"     Patient is asking how many mg of her Lasix she should be taking- wants to verify dosing. Lasix 80 mg- taking 1 1/2 tablets in the morning and 1 tablet in the afternoon. 120 mg- morning 80 mg -afternoon Patient voices understanding  Protocols used: MEDICATION QUESTION CALL-A-AH

## 2018-09-07 ENCOUNTER — Other Ambulatory Visit: Payer: Self-pay

## 2018-09-07 ENCOUNTER — Ambulatory Visit: Payer: Self-pay | Admitting: *Deleted

## 2018-09-07 ENCOUNTER — Emergency Department (HOSPITAL_COMMUNITY): Payer: Medicare Other

## 2018-09-07 ENCOUNTER — Inpatient Hospital Stay (HOSPITAL_COMMUNITY)
Admission: EM | Admit: 2018-09-07 | Discharge: 2018-09-15 | DRG: 554 | Disposition: A | Discharge status: Skilled Nursing Facility | Payer: Medicare Other | Attending: Family Medicine | Admitting: Family Medicine

## 2018-09-07 ENCOUNTER — Encounter (HOSPITAL_COMMUNITY): Payer: Self-pay | Admitting: Emergency Medicine

## 2018-09-07 DIAGNOSIS — K219 Gastro-esophageal reflux disease without esophagitis: Secondary | ICD-10-CM | POA: Diagnosis not present

## 2018-09-07 DIAGNOSIS — L98499 Non-pressure chronic ulcer of skin of other sites with unspecified severity: Secondary | ICD-10-CM | POA: Diagnosis present

## 2018-09-07 DIAGNOSIS — L97919 Non-pressure chronic ulcer of unspecified part of right lower leg with unspecified severity: Secondary | ICD-10-CM | POA: Diagnosis present

## 2018-09-07 DIAGNOSIS — E1142 Type 2 diabetes mellitus with diabetic polyneuropathy: Secondary | ICD-10-CM | POA: Diagnosis not present

## 2018-09-07 DIAGNOSIS — X58XXXA Exposure to other specified factors, initial encounter: Secondary | ICD-10-CM | POA: Diagnosis present

## 2018-09-07 DIAGNOSIS — I509 Heart failure, unspecified: Secondary | ICD-10-CM | POA: Diagnosis not present

## 2018-09-07 DIAGNOSIS — Z87892 Personal history of anaphylaxis: Secondary | ICD-10-CM

## 2018-09-07 DIAGNOSIS — Z91013 Allergy to seafood: Secondary | ICD-10-CM

## 2018-09-07 DIAGNOSIS — M86172 Other acute osteomyelitis, left ankle and foot: Secondary | ICD-10-CM | POA: Diagnosis present

## 2018-09-07 DIAGNOSIS — Z8042 Family history of malignant neoplasm of prostate: Secondary | ICD-10-CM

## 2018-09-07 DIAGNOSIS — Z888 Allergy status to other drugs, medicaments and biological substances status: Secondary | ICD-10-CM

## 2018-09-07 DIAGNOSIS — Z87442 Personal history of urinary calculi: Secondary | ICD-10-CM

## 2018-09-07 DIAGNOSIS — N179 Acute kidney failure, unspecified: Secondary | ICD-10-CM | POA: Diagnosis present

## 2018-09-07 DIAGNOSIS — M109 Gout, unspecified: Secondary | ICD-10-CM | POA: Diagnosis not present

## 2018-09-07 DIAGNOSIS — S86012A Strain of left Achilles tendon, initial encounter: Secondary | ICD-10-CM | POA: Diagnosis not present

## 2018-09-07 DIAGNOSIS — E11622 Type 2 diabetes mellitus with other skin ulcer: Secondary | ICD-10-CM | POA: Diagnosis present

## 2018-09-07 DIAGNOSIS — E46 Unspecified protein-calorie malnutrition: Secondary | ICD-10-CM | POA: Diagnosis present

## 2018-09-07 DIAGNOSIS — Z803 Family history of malignant neoplasm of breast: Secondary | ICD-10-CM

## 2018-09-07 DIAGNOSIS — G4733 Obstructive sleep apnea (adult) (pediatric): Secondary | ICD-10-CM | POA: Diagnosis present

## 2018-09-07 DIAGNOSIS — Z8249 Family history of ischemic heart disease and other diseases of the circulatory system: Secondary | ICD-10-CM

## 2018-09-07 DIAGNOSIS — Z7982 Long term (current) use of aspirin: Secondary | ICD-10-CM

## 2018-09-07 DIAGNOSIS — I2510 Atherosclerotic heart disease of native coronary artery without angina pectoris: Secondary | ICD-10-CM | POA: Diagnosis present

## 2018-09-07 DIAGNOSIS — I130 Hypertensive heart and chronic kidney disease with heart failure and stage 1 through stage 4 chronic kidney disease, or unspecified chronic kidney disease: Secondary | ICD-10-CM | POA: Diagnosis not present

## 2018-09-07 DIAGNOSIS — J449 Chronic obstructive pulmonary disease, unspecified: Secondary | ICD-10-CM | POA: Diagnosis present

## 2018-09-07 DIAGNOSIS — N183 Chronic kidney disease, stage 3 unspecified: Secondary | ICD-10-CM | POA: Diagnosis present

## 2018-09-07 DIAGNOSIS — E1122 Type 2 diabetes mellitus with diabetic chronic kidney disease: Secondary | ICD-10-CM | POA: Diagnosis present

## 2018-09-07 DIAGNOSIS — Z90710 Acquired absence of both cervix and uterus: Secondary | ICD-10-CM

## 2018-09-07 DIAGNOSIS — E785 Hyperlipidemia, unspecified: Secondary | ICD-10-CM | POA: Diagnosis not present

## 2018-09-07 DIAGNOSIS — Z880 Allergy status to penicillin: Secondary | ICD-10-CM

## 2018-09-07 DIAGNOSIS — Z79899 Other long term (current) drug therapy: Secondary | ICD-10-CM

## 2018-09-07 DIAGNOSIS — Z6841 Body Mass Index (BMI) 40.0 and over, adult: Secondary | ICD-10-CM | POA: Diagnosis not present

## 2018-09-07 DIAGNOSIS — Z800 Family history of malignant neoplasm of digestive organs: Secondary | ICD-10-CM

## 2018-09-07 DIAGNOSIS — Z20828 Contact with and (suspected) exposure to other viral communicable diseases: Secondary | ICD-10-CM | POA: Diagnosis not present

## 2018-09-07 DIAGNOSIS — L97911 Non-pressure chronic ulcer of unspecified part of right lower leg limited to breakdown of skin: Secondary | ICD-10-CM

## 2018-09-07 DIAGNOSIS — Z833 Family history of diabetes mellitus: Secondary | ICD-10-CM

## 2018-09-07 DIAGNOSIS — R262 Difficulty in walking, not elsewhere classified: Secondary | ICD-10-CM

## 2018-09-07 DIAGNOSIS — Z8711 Personal history of peptic ulcer disease: Secondary | ICD-10-CM

## 2018-09-07 DIAGNOSIS — I87331 Chronic venous hypertension (idiopathic) with ulcer and inflammation of right lower extremity: Secondary | ICD-10-CM

## 2018-09-07 DIAGNOSIS — Z882 Allergy status to sulfonamides status: Secondary | ICD-10-CM

## 2018-09-07 DIAGNOSIS — G629 Polyneuropathy, unspecified: Secondary | ICD-10-CM

## 2018-09-07 DIAGNOSIS — Z7951 Long term (current) use of inhaled steroids: Secondary | ICD-10-CM | POA: Diagnosis not present

## 2018-09-07 DIAGNOSIS — I10 Essential (primary) hypertension: Secondary | ICD-10-CM | POA: Diagnosis present

## 2018-09-07 DIAGNOSIS — R600 Localized edema: Secondary | ICD-10-CM | POA: Diagnosis present

## 2018-09-07 LAB — CBC WITH DIFFERENTIAL/PLATELET
Abs Immature Granulocytes: 0.02 10*3/uL (ref 0.00–0.07)
Basophils Absolute: 0.1 10*3/uL (ref 0.0–0.1)
Basophils Relative: 2 %
Eosinophils Absolute: 0.2 10*3/uL (ref 0.0–0.5)
Eosinophils Relative: 4 %
HCT: 35.6 % — ABNORMAL LOW (ref 36.0–46.0)
Hemoglobin: 10.7 g/dL — ABNORMAL LOW (ref 12.0–15.0)
Immature Granulocytes: 1 %
Lymphocytes Relative: 28 %
Lymphs Abs: 1.1 10*3/uL (ref 0.7–4.0)
MCH: 29 pg (ref 26.0–34.0)
MCHC: 30.1 g/dL (ref 30.0–36.0)
MCV: 96.5 fL (ref 80.0–100.0)
Monocytes Absolute: 0.6 10*3/uL (ref 0.1–1.0)
Monocytes Relative: 14 %
Neutro Abs: 2.1 10*3/uL (ref 1.7–7.7)
Neutrophils Relative %: 51 %
Platelets: 159 10*3/uL (ref 150–400)
RBC: 3.69 MIL/uL — ABNORMAL LOW (ref 3.87–5.11)
RDW: 15.3 % (ref 11.5–15.5)
WBC: 4.1 10*3/uL (ref 4.0–10.5)
nRBC: 0 % (ref 0.0–0.2)

## 2018-09-07 LAB — URINALYSIS, ROUTINE W REFLEX MICROSCOPIC
Bacteria, UA: NONE SEEN
Bilirubin Urine: NEGATIVE
Glucose, UA: NEGATIVE mg/dL
Hgb urine dipstick: NEGATIVE
Ketones, ur: NEGATIVE mg/dL
Nitrite: NEGATIVE
Protein, ur: NEGATIVE mg/dL
Specific Gravity, Urine: 1.009 (ref 1.005–1.030)
pH: 6 (ref 5.0–8.0)

## 2018-09-07 LAB — GLUCOSE, CAPILLARY: Glucose-Capillary: 74 mg/dL (ref 70–99)

## 2018-09-07 LAB — BASIC METABOLIC PANEL
Anion gap: 10 (ref 5–15)
BUN: 28 mg/dL — ABNORMAL HIGH (ref 8–23)
CO2: 30 mmol/L (ref 22–32)
Calcium: 9.2 mg/dL (ref 8.9–10.3)
Chloride: 101 mmol/L (ref 98–111)
Creatinine, Ser: 2.04 mg/dL — ABNORMAL HIGH (ref 0.44–1.00)
GFR calc Af Amer: 28 mL/min — ABNORMAL LOW (ref >60)
GFR calc non Af Amer: 24 mL/min — ABNORMAL LOW (ref >60)
Glucose, Bld: 103 mg/dL — ABNORMAL HIGH (ref 70–99)
Potassium: 4.2 mmol/L (ref 3.5–5.1)
Sodium: 141 mmol/L (ref 135–145)

## 2018-09-07 LAB — SARS CORONAVIRUS 2 BY RT PCR (HOSPITAL ORDER, PERFORMED IN ~~LOC~~ HOSPITAL LAB): SARS Coronavirus 2: NEGATIVE

## 2018-09-07 MED ORDER — SPIRONOLACTONE 25 MG PO TABS
25.0000 mg | ORAL_TABLET | Freq: Every day | ORAL | Status: DC
  Administered 2018-09-08 – 2018-09-15 (×8): 25 mg via ORAL
  Filled 2018-09-07 (×8): qty 1

## 2018-09-07 MED ORDER — ONDANSETRON HCL 4 MG/2ML IJ SOLN
4.0000 mg | Freq: Once | INTRAMUSCULAR | Status: AC
  Administered 2018-09-07: 4 mg via INTRAVENOUS
  Filled 2018-09-07: qty 2

## 2018-09-07 MED ORDER — CIPROFLOXACIN IN D5W 400 MG/200ML IV SOLN
400.0000 mg | Freq: Once | INTRAVENOUS | Status: AC
  Administered 2018-09-07: 400 mg via INTRAVENOUS
  Filled 2018-09-07: qty 200

## 2018-09-07 MED ORDER — SIMVASTATIN 20 MG PO TABS
40.0000 mg | ORAL_TABLET | Freq: Every day | ORAL | Status: DC
  Administered 2018-09-08 – 2018-09-14 (×8): 40 mg via ORAL
  Filled 2018-09-07 (×8): qty 2

## 2018-09-07 MED ORDER — ASPIRIN EC 81 MG PO TBEC
81.0000 mg | DELAYED_RELEASE_TABLET | Freq: Every day | ORAL | Status: DC
  Administered 2018-09-08 – 2018-09-14 (×8): 81 mg via ORAL
  Filled 2018-09-07 (×9): qty 1

## 2018-09-07 MED ORDER — FUROSEMIDE 40 MG PO TABS
120.0000 mg | ORAL_TABLET | Freq: Every day | ORAL | Status: DC
  Administered 2018-09-08 – 2018-09-15 (×8): 120 mg via ORAL
  Filled 2018-09-07 (×9): qty 3

## 2018-09-07 MED ORDER — SODIUM CHLORIDE 0.9 % IV SOLN
INTRAVENOUS | Status: DC
  Administered 2018-09-08: 01:00:00 via INTRAVENOUS

## 2018-09-07 MED ORDER — ONDANSETRON HCL 4 MG/2ML IJ SOLN: 4.0000 mg | Freq: Four times a day (QID) | INTRAMUSCULAR | Status: DC | PRN

## 2018-09-07 MED ORDER — FUROSEMIDE 40 MG PO TABS
80.0000 mg | ORAL_TABLET | Freq: Every day | ORAL | Status: DC
  Administered 2018-09-08 – 2018-09-14 (×7): 80 mg via ORAL
  Filled 2018-09-07 (×7): qty 2

## 2018-09-07 MED ORDER — CLINDAMYCIN PHOSPHATE 600 MG/50ML IV SOLN
600.0000 mg | Freq: Once | INTRAVENOUS | Status: AC
  Administered 2018-09-07: 600 mg via INTRAVENOUS
  Filled 2018-09-07: qty 50

## 2018-09-07 MED ORDER — MONTELUKAST SODIUM 10 MG PO TABS
10.0000 mg | ORAL_TABLET | Freq: Every day | ORAL | Status: DC
  Administered 2018-09-08 – 2018-09-14 (×8): 10 mg via ORAL
  Filled 2018-09-07 (×8): qty 1

## 2018-09-07 MED ORDER — FAMOTIDINE 20 MG PO TABS
20.0000 mg | ORAL_TABLET | Freq: Two times a day (BID) | ORAL | Status: DC
  Administered 2018-09-08 – 2018-09-15 (×15): 20 mg via ORAL
  Filled 2018-09-07 (×17): qty 1

## 2018-09-07 MED ORDER — MORPHINE SULFATE (PF) 4 MG/ML IV SOLN
INTRAVENOUS | Status: AC
  Filled 2018-09-07: qty 1

## 2018-09-07 MED ORDER — SODIUM CHLORIDE 0.9 % IV SOLN
2.0000 g | Freq: Once | INTRAVENOUS | Status: DC
  Administered 2018-09-07: 2 g via INTRAVENOUS

## 2018-09-07 MED ORDER — ALBUTEROL SULFATE (2.5 MG/3ML) 0.083% IN NEBU
2.5000 mg | INHALATION_SOLUTION | Freq: Four times a day (QID) | RESPIRATORY_TRACT | Status: DC | PRN
  Administered 2018-09-12 (×2): 2.5 mg via RESPIRATORY_TRACT
  Filled 2018-09-07 (×2): qty 3

## 2018-09-07 MED ORDER — ONDANSETRON HCL 4 MG PO TABS: 4.0000 mg | ORAL_TABLET | Freq: Four times a day (QID) | ORAL | Status: DC | PRN

## 2018-09-07 MED ORDER — OXCARBAZEPINE 300 MG PO TABS
300.0000 mg | ORAL_TABLET | Freq: Two times a day (BID) | ORAL | Status: DC
  Administered 2018-09-08 – 2018-09-15 (×15): 300 mg via ORAL
  Filled 2018-09-07 (×17): qty 1

## 2018-09-07 MED ORDER — HYDROXYZINE HCL 25 MG PO TABS: 25.0000 mg | ORAL_TABLET | Freq: Four times a day (QID) | ORAL | Status: DC | PRN

## 2018-09-07 MED ORDER — SODIUM CHLORIDE 0.9 % IV SOLN
2.0000 g | Freq: Three times a day (TID) | INTRAVENOUS | Status: DC
  Administered 2018-09-08: 2 g via INTRAVENOUS
  Filled 2018-09-07 (×7): qty 2

## 2018-09-07 MED ORDER — TRAMADOL HCL 50 MG PO TABS
50.0000 mg | ORAL_TABLET | Freq: Once | ORAL | Status: AC
  Administered 2018-09-08: 50 mg via ORAL
  Filled 2018-09-07: qty 1

## 2018-09-07 MED ORDER — ACETAMINOPHEN 325 MG PO TABS
650.0000 mg | ORAL_TABLET | Freq: Four times a day (QID) | ORAL | Status: DC | PRN
  Administered 2018-09-08 – 2018-09-12 (×9): 650 mg via ORAL
  Filled 2018-09-07 (×9): qty 2

## 2018-09-07 MED ORDER — ENOXAPARIN SODIUM 40 MG/0.4ML ~~LOC~~ SOLN
40.0000 mg | SUBCUTANEOUS | Status: DC
  Administered 2018-09-08: 40 mg via SUBCUTANEOUS
  Filled 2018-09-07: qty 0.4

## 2018-09-07 MED ORDER — MORPHINE SULFATE (PF) 4 MG/ML IV SOLN
4.0000 mg | Freq: Once | INTRAVENOUS | Status: AC
  Administered 2018-09-07: 4 mg via INTRAVENOUS
  Filled 2018-09-07: qty 1

## 2018-09-07 MED ORDER — INSULIN ASPART 100 UNIT/ML ~~LOC~~ SOLN
0.0000 [IU] | Freq: Three times a day (TID) | SUBCUTANEOUS | Status: DC
  Administered 2018-09-08 – 2018-09-11 (×3): 1 [IU] via SUBCUTANEOUS
  Administered 2018-09-11: 2 [IU] via SUBCUTANEOUS
  Administered 2018-09-12: 1 [IU] via SUBCUTANEOUS
  Administered 2018-09-12: 3 [IU] via SUBCUTANEOUS
  Administered 2018-09-13: 1 [IU] via SUBCUTANEOUS
  Administered 2018-09-13: 3 [IU] via SUBCUTANEOUS
  Administered 2018-09-14: 1 [IU] via SUBCUTANEOUS
  Administered 2018-09-14: 2 [IU] via SUBCUTANEOUS
  Administered 2018-09-14: 1 [IU] via SUBCUTANEOUS
  Administered 2018-09-15: 2 [IU] via SUBCUTANEOUS
  Administered 2018-09-15: 09:00:00 1 [IU] via SUBCUTANEOUS

## 2018-09-07 MED ORDER — GABAPENTIN 100 MG PO CAPS
100.0000 mg | ORAL_CAPSULE | Freq: Three times a day (TID) | ORAL | Status: DC
  Administered 2018-09-08 – 2018-09-15 (×23): 100 mg via ORAL
  Filled 2018-09-07 (×24): qty 1

## 2018-09-07 MED ORDER — COLCHICINE 0.6 MG PO TABS
0.6000 mg | ORAL_TABLET | Freq: Every day | ORAL | Status: DC
  Administered 2018-09-08 – 2018-09-15 (×8): 0.6 mg via ORAL
  Filled 2018-09-07 (×8): qty 1

## 2018-09-07 MED ORDER — VANCOMYCIN HCL 10 G IV SOLR
2500.0000 mg | Freq: Once | INTRAVENOUS | Status: AC
  Administered 2018-09-07: 2500 mg via INTRAVENOUS
  Filled 2018-09-07: qty 2500

## 2018-09-07 MED ORDER — ACETAMINOPHEN 650 MG RE SUPP: 650.0000 mg | Freq: Four times a day (QID) | RECTAL | Status: DC | PRN

## 2018-09-07 MED ORDER — MORPHINE SULFATE (PF) 4 MG/ML IV SOLN
4.0000 mg | Freq: Once | INTRAVENOUS | Status: AC
  Administered 2018-09-07: 4 mg via INTRAVENOUS

## 2018-09-07 MED ORDER — ALLOPURINOL 100 MG PO TABS
100.0000 mg | ORAL_TABLET | Freq: Every day | ORAL | Status: DC
  Administered 2018-09-08 – 2018-09-15 (×8): 100 mg via ORAL
  Filled 2018-09-07 (×8): qty 1

## 2018-09-07 MED ORDER — FLUTICASONE FUROATE-VILANTEROL 200-25 MCG/INH IN AEPB
1.0000 | INHALATION_SPRAY | Freq: Every day | RESPIRATORY_TRACT | Status: DC
  Administered 2018-09-08 – 2018-09-15 (×8): 1 via RESPIRATORY_TRACT
  Filled 2018-09-07: qty 28

## 2018-09-07 MED ORDER — CARVEDILOL 25 MG PO TABS
25.0000 mg | ORAL_TABLET | Freq: Every day | ORAL | Status: DC
  Administered 2018-09-08 – 2018-09-15 (×8): 25 mg via ORAL
  Filled 2018-09-07 (×8): qty 1

## 2018-09-07 MED ORDER — VANCOMYCIN HCL 10 G IV SOLR: 1750.0000 mg | INTRAVENOUS | Status: DC

## 2018-09-07 MED ORDER — FOLIC ACID 1 MG PO TABS
500.0000 ug | ORAL_TABLET | Freq: Every day | ORAL | Status: DC
  Administered 2018-09-08 – 2018-09-14 (×8): 0.5 mg via ORAL
  Filled 2018-09-07 (×8): qty 1

## 2018-09-07 NOTE — Telephone Encounter (Signed)
FYI

## 2018-09-07 NOTE — ED Notes (Signed)
ED Provider at bedside. 

## 2018-09-07 NOTE — ED Notes (Signed)
XR at bedside

## 2018-09-07 NOTE — H&P (Signed)
TRH H&P    Patient Demographics:    April [name], is a 68 y.o. female  MRN: [id number]  DOB - 11/08/1950  Admit Date - 09/07/2018  Referring MD/NP/PA: Isla Pence  Outpatient Primary MD for the patient is [name], MD  Patient coming from: Home  Chief complaint-left heel pain   HPI:    April [name]  is a 67 y.o. female, with history of asthma, CAD, type 2 diabetes mellitus, morbid obesity, chronic right lower extremity wound, hypertension who came to hospital after patient developed pain in both feet left more than right.  Pain she describes as sharp pain more in the left heel.  Patient has chronic right lower extremity wound and is being followed by Dr. [name].  She gets wound care nurses provide wound care 3 times a week. Patient denies fever or chills. Denies nausea vomiting or diarrhea. Denies chest pain or shortness of breath. Patient has diabetes mellitus which is diet controlled as per patient.  In the ED x-ray of the left ankle showed possible osteomyelitis involving left calcaneus.  Patient started on IV antibiotics.  Orthopedics was consulted by ED physician and they recommended patient to be transferred to [facility].    Review of systems:    In addition to the HPI above,   All other systems reviewed and are negative.    Past History of the following :    Past Medical History:  Diagnosis Date  . Anemia   . Asthma   . CAD (coronary artery disease)   . Carpal tunnel syndrome   . Cellulitis of both lower extremities 04/11/2015  . CHF (congestive heart failure) ([place])   . Chronic kidney disease    [facility]- followed by [facility]  . Colon polyp, hyperplastic 2007 & 2012  . Complication of anesthesia 1999   svt with renal calculi surgery, no problems since  . CTS (carpal tunnel syndrome)    bilateral  . Diabetes mellitus   . Eczema   . GERD (gastroesophageal reflux  disease)   . History of kidney stones 1999  . Hyperlipidemia   . Hypertension   . Leg ulcer ([place]) 04/24/2015   Right lateral leg No evidence of an infection Monitor closely Keep edema controlled   . Leg ulcer ([place])    right lower  . Meralgia paresthetica    Dr. [name]  . Morbid obesity ([place])   . Morbid obesity ([place])   . Neuropathy    toes and legs  . Osteoarthrosis, unspecified whether generalized or localized, lower leg    knee  . PUD (peptic ulcer disease)   . Shortness of breath dyspnea    with exertion  . Sleep apnea    per progress note 02/25/2018  . Type II or unspecified type diabetes mellitus without mention of complication, not stated as uncontrolled   . Unspecified hereditary and idiopathic peripheral neuropathy   . Urticaria   . Vitamin B12 deficiency   . Wears glasses   . Wound cellulitis    right upper leg,  healing well      Past Surgical History:  Procedure Laterality Date  . ABDOMINAL HYSTERECTOMY    . CARDIAC CATHETERIZATION  2002   non obstructive disease  . colonoscopy with polypectomy  2007 & 2012    hyperplastic ;Dr Watt Climes  . COLONOSCOPY WITH PROPOFOL N/A 06/04/2015   Procedure: COLONOSCOPY WITH PROPOFOL;  Surgeon: Jerene Bears, MD;  Location: [facility];  Service: Gastroenterology;  Laterality: N/A;  . DEBRIDEMENT LEG Right 03/02/2018   WOUND VAC APPLIED  . DEBRIDEMENT LEG Right 05/27/2018   RIGHT LOWER LEG DEBRIDEMENT,  SKIN GRAFT, VAC PLACEMENT   . DILATION AND CURETTAGE OF UTERUS     multiple  . HEMORRHOID SURGERY    . I&D EXTREMITY Right 03/02/2018   Procedure: RIGHT LEG DEBRIDEMENT AND PLACE VAC;  Surgeon: [name], MD;  Location: [place];  Service: Orthopedics;  Laterality: Right;  . I&D EXTREMITY Right 03/04/2018   Procedure: REPEAT IRRIGATION AND DEBRIDEMENT RIGHT LEG, PLACE WOUND VAC;  Surgeon: [name], MD;  Location: [place];  Service: Orthopedics;  Laterality: Right;  . I&D EXTREMITY Right 03/30/2018   Procedure: IRRIGATION AND  DEBRIDEMENT RIGHT LEG, APPLY WOUND VAC;  Surgeon: [name], MD;  Location: [place];  Service: Orthopedics;  Laterality: Right;  . I&D EXTREMITY Right 05/27/2018   Procedure: RIGHT LOWER LEG DEBRIDEMENT,  SKIN GRAFT, VAC PLACEMENT;  Surgeon: [name], MD;  Location: [place];  Service: Orthopedics;  Laterality: Right;  RIGHT LOWER LEG DEBRIDEMENT,  SKIN GRAFT, VAC PLACEMENT  . renal calculi  12/1997   SVT with induction of anesthesia  . right knee arthroscopy    . SKIN SPLIT GRAFT Right 03/04/2018   Procedure: POSSIBLE SPLIT THICKNESS SKIN GRAFT;  Surgeon: [name], MD;  Location: [place];  Service: Orthopedics;  Laterality: Right;  . SKIN SPLIT GRAFT Right 04/01/2018   Procedure: REPEAT IRRIGATION AND DEBRIDEMENT RIGHT LEG, APPLY SPLIT THICKNESS SKIN GRAFT;  Surgeon: [name], MD;  Location: [place];  Service: Orthopedics;  Laterality: Right;  . TONSILLECTOMY AND ADENOIDECTOMY        Social History:      Social History   Tobacco Use  . Smoking status: Never Smoker  . Smokeless tobacco: Never Used  Substance Use Topics  . Alcohol use: No    Alcohol/week: 0.0 standard drinks       Family History :     Family History  Problem Relation Age of Onset  . Colon cancer Mother   . Prostate cancer Father   . Colon cancer Father   . Diabetes Maternal Aunt   . Breast cancer Maternal Aunt   . Diabetes Maternal Uncle   . Diabetes Paternal Aunt   . Stroke Paternal Aunt        > 65  . Heart disease Paternal Aunt   . Diabetes Paternal Uncle   . Breast cancer Maternal Aunt         X 2  . Breast cancer Cousin       Home Medications:   Prior to Admission medications   Medication Sig Start Date End Date Taking? Authorizing Provider  albuterol (PROVENTIL HFA) 108 (90 Base) MCG/ACT inhaler Inhale 2 puffs into the lungs every 4 (four) hours as needed for wheezing or shortness of breath. 09/11/16 09/27/18 Yes Burns, Claudina Lick, MD  albuterol (PROVENTIL) (2.5 MG/3ML) 0.083%  nebulizer solution Take 3 mLs (2.5 mg total) by nebulization every 6 (six) hours as needed  for wheezing or shortness of breath. 03/23/17  Yes Burns, Claudina Lick, MD  allopurinol (ZYLOPRIM) 100 MG tablet Take 1 tablet (100 mg total) by mouth daily. 08/29/18  Yes Burns, Claudina Lick, MD  aspirin (ECOTRIN LOW STRENGTH) 81 MG EC tablet Take 81 mg by mouth at bedtime.    Yes [provider]  budesonide-formoterol (SYMBICORT) 160-4.5 MCG/ACT inhaler one - two inhalations every 12 hours; gargle and spit after use Patient taking differently: Inhale 2 puffs into the lungs 2 (two) times daily as needed (shortness of breath). gargle and spit after use 02/19/15  Yes Burns, Claudina Lick, MD  carvedilol (COREG) 25 MG tablet TAKE 1 TABLET BY MOUTH TWICE DAILY WITH A MEAL Patient taking differently: Take 25 mg by mouth daily.  07/18/18  Yes Burns, Claudina Lick, MD  COD LIVER OIL PO Take 1 capsule by mouth daily.   Yes [provider]  colchicine 0.6 MG tablet Take 1 tablet (0.6 mg total) by mouth daily. 08/24/18  Yes Thurnell Lose, MD  cyanocobalamin (,VITAMIN B-12,) 1000 MCG/ML injection INJECT 1ML INTO MUSCLE EVERY 30 DAYS Patient taking differently: Inject 1,000 mcg into the muscle every 30 (thirty) days.  05/06/18  Yes Burns, Claudina Lick, MD  diclofenac sodium (VOLTAREN) 1 % GEL Apply 2 g topically 4 (four) times daily. Patient taking differently: Apply 2 g topically 4 (four) times daily as needed (pain).  03/20/16  Yes Marcial Pacas, MD  famotidine (PEPCID) 20 MG tablet Take 1 tablet (20 mg total) by mouth 2 (two) times daily. 12/14/17  Yes Burns, Claudina Lick, MD  Flaxseed, Linseed, (FLAX SEEDS PO) Take 1 capsule by mouth 2 (two) times a day.   Yes [provider]  folic acid (FOLVITE) 962 MCG tablet Take 400 mcg by mouth at bedtime.    Yes [provider]  furosemide (LASIX) 80 MG tablet 1.5 tablets in morning, 1 tablet in afternoon Patient taking differently: Take 80-120 mg by mouth 2 (two) times daily.  1.5 tablets in morning, 1 tablet in afternoon 08/30/18  Yes Burns, Claudina Lick, MD  gabapentin (NEURONTIN) 100 MG capsule Take 1 capsule (100 mg total) by mouth 3 (three) times daily. 08/23/18 09/22/18 Yes Thurnell Lose, MD  montelukast (SINGULAIR) 10 MG tablet Take 1 tablet (10 mg total) by mouth at bedtime. 08/12/18  Yes Padgett, Rae Halsted, MD  Multiple Vitamin (MULTIVITAMIN WITH MINERALS) TABS tablet Take 1 tablet by mouth daily.   Yes [provider]  Omega-3 Fatty Acids (FISH OIL) 1000 MG CAPS Take 1,000 mg by mouth daily.    Yes [provider]  Oxcarbazepine (TRILEPTAL) 300 MG tablet Take 1 tablet (300 mg total) by mouth 2 (two) times daily. 08/23/18  Yes Thurnell Lose, MD  oxyCODONE-acetaminophen (PERCOCET) 5-325 MG tablet Take 1 tablet by mouth every 6 (six) hours as needed for moderate pain or severe pain. 08/31/18 08/31/19 Yes Dondra Prader R, NP  potassium chloride SA (K-DUR,KLOR-CON) 20 MEQ tablet TAKE 3 TABLETS BY MOUTH DAILY. Patient taking differently: Take 40 mEq by mouth 2 (two) times daily.  02/25/18  Yes Burns, Claudina Lick, MD  simvastatin (ZOCOR) 40 MG tablet Take 1 tablet (40 mg total) by mouth at bedtime. 04/25/18  Yes Burns, Claudina Lick, MD  spironolactone (ALDACTONE) 25 MG tablet TAKE 1 TABLET BY MOUTH ONCE DAILY Patient taking differently: Take 25 mg by mouth daily.  01/12/18  Yes Burns, Claudina Lick, MD  calcium carbonate (TUMS - DOSED IN MG ELEMENTAL CALCIUM)  500 MG chewable tablet Chew 2 tablets by mouth daily as needed for indigestion or heartburn.    [provider]  diphenhydrAMINE (BENADRYL) 12.5 MG/5ML liquid Take 25 mg by mouth daily as needed for itching.    [provider]  docusate sodium (COLACE) 100 MG capsule Take 1 capsule (100 mg total) by mouth 2 (two) times daily. Patient not taking: Reported on 09/07/2018 03/09/18   Rayburn, Neta Mends, PA-C  EPINEPHrine 0.3 mg/0.3 mL IJ SOAJ injection Inject 0.3 mLs (0.3 mg total) into the  muscle once as needed for anaphylaxis. 08/12/18   Kennith Gain, MD  hydrOXYzine (ATARAX/VISTARIL) 25 MG tablet Take 25 mg by mouth every 6 (six) hours as needed for itching.     [provider]  Lancets [facility] ULTRASOFT) lancets Use to help check blood sugars twice a day Dx E11.9 04/29/15   [name], MD  permethrin (ELIMITE) 5 % cream Apply 1 application topically as needed. Hives and rash on arms and chest 08/13/18   [provider]  silver sulfADIAZINE (SILVADENE) 1 % cream Apply 1 application topically daily. 06/08/18   [name], MD  SYRINGE-NEEDLE, DISP, 3 ML (B-D 3CC LUER-LOK SYR 23GX1-1/2) 23G X 1-1/2" 3 ML MISC USE TO INJECT B-12 ONCE MONTHLY 08/15/18   Burns, Claudina Lick, MD  SYRINGE-NEEDLE, DISP, 3 ML 23G X 1" 3 ML MISC Use with B12 to inject IM every 30 days 05/06/16   [name], MD     Allergies:     Allergies  Allergen Reactions  . Penicillins Rash and Other (See Comments)    She was told not to take it anymore. Has patient had a PCN reaction causing immediate rash, facial/tongue/throat swelling, SOB or lightheadedness with hypotension: Yes Has patient had a PCN reaction causing severe rash involving mucus membranes or skin necrosis: No Has patient had a PCN reaction that required hospitalization: No Has patient had a PCN reaction occurring within the last 10 years: No If all of the above answers are "NO", then may proceed with Cephalosporin use.'  . Shellfish Allergy Anaphylaxis  . Sulfonamide Derivatives Anaphylaxis and Other (See Comments)    REACTION: internal "burning"  . Hydrochlorothiazide W-Triamterene Other (See Comments)    Hypokalemia  . Lotensin [Benazepril Hcl] Hives  . Dipyridamole Other (See Comments)    Unknown reaction  . Estrogens Other (See Comments)    Unknown reaction  . Hydrochlorothiazide Other (See Comments)    Unknown reaction  . Metronidazole Other (See Comments)    Unknown reaction  . Other      Pickles-itching  . Spironolactone Other (See Comments)    UNSPECIFIED > "kidney problems"  . Sulfa Antibiotics Other (See Comments)    Unknown reaction  . Torsemide Other (See Comments)    Unknown reaction  . Valsartan Other (See Comments)    Unknown reaction  . Madelaine Bhat Isothiocyanate] Itching     Physical Exam:   Vitals  Blood pressure (!) 145/62, pulse 65, temperature 98.4 F (36.9 C), temperature source Oral, resp. rate 14, SpO2 100 %.  1.  General: Appears in no acute distress  2. Psychiatric: Alert, oriented x3, intact insight and judgment  3. Neurologic: Cranial nerves II through XII grossly intact, motor strength 5/5 in all extremities  4. HEENMT:  Atraumatic normocephalic, extraocular muscles are intact  5. Respiratory : Clear to auscultation bilaterally, no wheezing or crackles  6. Cardiovascular : S1-S2, regular, no murmur auscultated  7. Gastrointestinal:  Abdomen is soft, nontender, no organomegaly  8. Skin:      9.Musculoskeletal:  Left heel is tender to palpation, mild erythema and warmth.  No ulcer noted on the left heel.    Data Review:    CBC Recent Labs  Lab 09/07/18 1530  WBC 4.1  HGB 10.7*  HCT 35.6*  PLT 159  MCV 96.5  MCH 29.0  MCHC 30.1  RDW 15.3  LYMPHSABS 1.1  MONOABS 0.6  EOSABS 0.2  BASOSABS 0.1   ------------------------------------------------------------------------------------------------------------------  Results for orders placed or performed during the hospital encounter of 09/07/18 (from the past 48 hour(s))  Basic metabolic panel     Status: Abnormal   Collection Time: 09/07/18  3:30 PM  Result Value Ref Range   Sodium 141 135 - 145 mmol/L   Potassium 4.2 3.5 - 5.1 mmol/L   Chloride 101 98 - 111 mmol/L   CO2 30 22 - 32 mmol/L   Glucose, Bld 103 (H) 70 - 99 mg/dL   BUN 28 (H) 8 - 23 mg/dL   Creatinine, Ser 2.04 (H) 0.44 - 1.00 mg/dL   Calcium 9.2 8.9 - 10.3 mg/dL   GFR calc non Af Amer 24 (L)  >60 mL/min   GFR calc Af Amer 28 (L) >60 mL/min   Anion gap 10 5 - 15    Comment: Performed at [facility], [place] [address] [place], [place] [address]  CBC with Differential     Status: Abnormal   Collection Time: 09/07/18  3:30 PM  Result Value Ref Range   WBC 4.1 4.0 - 10.5 K/uL   RBC 3.69 (L) 3.87 - 5.11 MIL/uL   Hemoglobin 10.7 (L) 12.0 - 15.0 g/dL   HCT 35.6 (L) 36.0 - 46.0 %   MCV 96.5 80.0 - 100.0 fL   MCH 29.0 26.0 - 34.0 pg   MCHC 30.1 30.0 - 36.0 g/dL   RDW 15.3 11.5 - 15.5 %   Platelets 159 150 - 400 K/uL   nRBC 0.0 0.0 - 0.2 %   Neutrophils Relative % 51 %   Neutro Abs 2.1 1.7 - 7.7 K/uL   Lymphocytes Relative 28 %   Lymphs Abs 1.1 0.7 - 4.0 K/uL   Monocytes Relative 14 %   Monocytes Absolute 0.6 0.1 - 1.0 K/uL   Eosinophils Relative 4 %   Eosinophils Absolute 0.2 0.0 - 0.5 K/uL   Basophils Relative 2 %   Basophils Absolute 0.1 0.0 - 0.1 K/uL   Immature Granulocytes 1 %   Abs Immature Granulocytes 0.02 0.00 - 0.07 K/uL    Comment: Performed at [facility], [place] [address], [place], [place] [address]    Chemistries  Recent Labs  Lab 09/07/18 1530  NA 141  K 4.2  CL 101  CO2 30  GLUCOSE 103*  BUN 28*  CREATININE 2.04*  CALCIUM 9.2   ------------------------------------------------------------------------------------------------------------------  ------------------------------------------------------------------------------------------------------------------ GFR: Estimated Creatinine Clearance: 40 mL/min (A) (by C-G formula based on SCr of 2.04 mg/dL (H)). Liver Function Tests: No results for input(s): AST, ALT, ALKPHOS, BILITOT, PROT, ALBUMIN in the last 168 hours. No results for input(s): LIPASE, AMYLASE in the last 168 hours. No results for input(s): AMMONIA in the last 168 hours. Coagulation Profile: No results for input(s): INR, PROTIME in the last 168 hours. Cardiac Enzymes: No results for input(s):  CKTOTAL, CKMB, CKMBINDEX, TROPONINI in the last 168 hours. BNP (last 3 results) No results for input(s): PROBNP in the last 8760 hours. HbA1C:  No results for input(s): HGBA1C in the last 72 hours. CBG: No results for input(s): GLUCAP in the last 168 hours. Lipid Profile: No results for input(s): CHOL, HDL, LDLCALC, TRIG, CHOLHDL, LDLDIRECT in the last 72 hours. Thyroid Function Tests: No results for input(s): TSH, T4TOTAL, FREET4, T3FREE, THYROIDAB in the last 72 hours. Anemia Panel: No results for input(s): VITAMINB12, FOLATE, FERRITIN, TIBC, IRON, RETICCTPCT in the last 72 hours.  --------------------------------------------------------------------------------------------------------------- Urine analysis:    Component Value Date/Time   COLORURINE STRAW (A) 08/15/2018 0019   APPEARANCEUR CLEAR 08/15/2018 0019   LABSPEC 1.010 08/15/2018 0019   PHURINE 5.0 08/15/2018 0019   GLUCOSEU NEGATIVE 08/15/2018 0019   GLUCOSEU NEGATIVE 02/19/2015 1707   HGBUR NEGATIVE 08/15/2018 0019   BILIRUBINUR NEGATIVE 08/15/2018 0019   BILIRUBINUR negative 09/05/2013 0832   KETONESUR NEGATIVE 08/15/2018 0019   PROTEINUR NEGATIVE 08/15/2018 0019   UROBILINOGEN 2.0 (A) 02/19/2015 1707   NITRITE NEGATIVE 08/15/2018 0019   LEUKOCYTESUR NEGATIVE 08/15/2018 0019      Imaging Results:    Dg Tibia/fibula Right  Result Date: 09/07/2018 CLINICAL DATA:  Pain and lower extremity swelling EXAM: RIGHT TIBIA AND FIBULA - 2 VIEW COMPARISON:  February 15, 2018 FINDINGS: Again noted is diffuse subcutaneous edema seen surrounding the lower extremity. Surgical staples seen. No fracture or dislocation is seen. Right knee osteoarthritis is noted. IMPRESSION: Diffuse subcutaneous edema.  No acute osseous abnormality. Electronically Signed   By: Prudencio Pair M.D.   On: 09/07/2018 16:19   Dg Ankle Complete Left  Result Date: 09/07/2018 CLINICAL DATA:  Chronic bilateral leg wounds. EXAM: LEFT ANKLE COMPLETE - 3+ VIEW  COMPARISON:  Left ankle dated 08/20/2018. Left foot radiographs obtained today. FINDINGS: Progressive diffuse soft tissue swelling. Interval ill-defined destruction of the anterior aspect of the calcaneus inferiorly. Mild talotibial spur formation posteriorly. Moderate-sized inferior calcaneal spur. IMPRESSION: 1. Interval bone destruction suspicious for osteomyelitis involving the anterior aspect of the calcaneus. 2. Progressive diffuse soft tissue swelling. Electronically Signed   By: Claudie Revering M.D.   On: 09/07/2018 16:54   Dg Foot Complete Left  Result Date: 09/07/2018 CLINICAL DATA:  Bilateral chronic leg wounds with pain and difficulty walking. EXAM: LEFT FOOT - COMPLETE 3+ VIEW COMPARISON:  Left ankle radiographs obtained today. Left ankle radiographs dated 08/20/2018. FINDINGS: Again demonstrated is ill-defined bone destruction involving the distal aspect of the calcaneus inferiorly. This was not present on 08/20/2018. Moderate-sized inferior calcaneal spur formation. No soft tissue gas. IMPRESSION: Probable osteomyelitis involving the distal aspect of the calcaneus. Electronically Signed   By: Claudie Revering M.D.   On: 09/07/2018 16:56       Assessment & Plan:    Active Problems:   Osteomyelitis of ankle or foot, acute, left ([place])   1. Probable osteomyelitis involving distal aspect of the calcaneus-seen on x-ray of the left ankle, will start vancomycin and Azactam per pharmacy consultation.  Orthopedics has been consulted by ED physician.  Patient will transfer to [facility] for orthopedics evaluation.  Dr. [name] to evaluate patient in a.m. Defer ordering MRI to orthopedics.  Follow blood culture results.  2. Diabetes mellitus type 2-diet controlled, will start sliding scale insulin with NovoLog.  3. Acute kidney injury on CKD stage III-patient is on high-dose Lasix 160 mg in the morning and 80 mg at bedtime.  Creatinine mildly elevated 2.0, close to her baseline.  Follow BMP in  a.m.  4. CHF with preserved ejection fraction-appears euvolemic.  Continue Lasix, Coreg.  5. Chronic  right leg ulcer-patient gets wound care at home.  Will obtain wound care consultation in the hospital.  6. Hypertension-blood pressure stable, continue carvedilol, spironolactone  7. COPD-no acute exacerbation, continue montelukast, Dulera, PRN albuterol.    DVT Prophylaxis-   Lovenox   AM Labs Ordered, also please review Full Orders  Family Communication: Admission, patients condition and plan of care including tests being ordered have been discussed with the patient who indicate understanding and agree with the plan and Code Status.  Code Status: Full code  Admission status: Inpatient: Based on patients clinical presentation and evaluation of above clinical data, I have made determination that patient meets Inpatient criteria at this time.  Time spent in minutes : 60 minutes   Oswald Hillock M.D on 09/07/2018 at 6:19 PM

## 2018-09-07 NOTE — ED Notes (Signed)
[facility] called for transport 

## 2018-09-07 NOTE — ED Provider Notes (Addendum)
[facility] DEPT Provider Note   CSN: [id number] Arrival date & time: 09/07/18  1505    History   Chief Complaint Chief Complaint  Patient presents with  . Leg Pain    HPI April [name] is a 67 y.o. female.     Pt presents to the ED today with bilateral leg pain.  She has a chronic wound to her RLE which has required skin grafts by Dr. [name].  She has home health which has been changing her dressing 3 times a week.  Today, she said there was too much pain in her legs to walk to the bathroom.  Pt denies any f/c.     Past Medical History:  Diagnosis Date  . Anemia   . Asthma   . CAD (coronary artery disease)   . Carpal tunnel syndrome   . Cellulitis of both lower extremities 04/11/2015  . CHF (congestive heart failure) ([place])   . Chronic kidney disease    [facility]- followed by [facility]  . Colon polyp, hyperplastic 2007 & 2012  . Complication of anesthesia 1999   svt with renal calculi surgery, no problems since  . CTS (carpal tunnel syndrome)    bilateral  . Diabetes mellitus   . Eczema   . GERD (gastroesophageal reflux disease)   . History of kidney stones 1999  . Hyperlipidemia   . Hypertension   . Leg ulcer ([place]) 04/24/2015   Right lateral leg No evidence of an infection Monitor closely Keep edema controlled   . Leg ulcer ([place])    right lower  . Meralgia paresthetica    Dr. [name]  . Morbid obesity ([place])   . Morbid obesity ([place])   . Neuropathy    toes and legs  . Osteoarthrosis, unspecified whether generalized or localized, lower leg    knee  . PUD (peptic ulcer disease)   . Shortness of breath dyspnea    with exertion  . Sleep apnea    per progress note 02/25/2018  . Type II or unspecified type diabetes mellitus without mention of complication, not stated as uncontrolled   . Unspecified hereditary and idiopathic peripheral neuropathy   . Urticaria   . Vitamin B12 deficiency   . Wears glasses   . Wound cellulitis    right  upper leg, healing well    Patient Active Problem List   Diagnosis Date Noted  . Osteomyelitis of ankle or foot, acute, left ([place]) 09/07/2018  . Intractable pain 08/17/2018  . Acute on chronic kidney failure ([place]) 08/15/2018  . Onychomycosis 05/19/2018  . Idiopathic chronic venous hypertension of right lower extremity with ulcer and inflammation ([place]) 05/19/2018  . Traumatic wound dehiscence   . Skin lumps 01/14/2018  . Bilateral leg cramps 12/14/2017  . Dry skin dermatitis 06/29/2017  . Left shoulder pain 08/14/2016  . Meralgia paresthetica 07/16/2016  . Chronic left-sided low back pain with left-sided sciatica 06/02/2016  . Whole body pain 03/20/2016  . Diabetic peripheral neuropathy ([place]) 03/20/2016  . Osteopenia 01/11/2016  . CKD (chronic kidney disease) stage 3, GFR 30-59 ml/min ([facility]) 01/02/2016  . Hand paresthesia 07/18/2015  . Carpal tunnel syndrome 06/07/2015  . Family history of colon cancer   . Diabetes mellitus with neurological manifestations ([place]) 04/18/2015  . Bilateral leg edema 04/11/2015  . Abnormality of gait 01/03/2015  . Hereditary and idiopathic peripheral neuropathy 01/03/2015  . GERD (gastroesophageal reflux disease) 06/17/2014  . Hx of colonic polyps 12/15/2012  . Vitamin B12 deficiency  11/03/2012  . Intrinsic asthma 03/23/2012  . Chronic diastolic heart failure ([place]) 02/20/2011  . OSA (obstructive sleep apnea) 09/17/2010  . URINARY URGENCY 01/08/2010  . CAD, NATIVE VESSEL 11/20/2008  . Osteoarthritis of knees, bilateral 06/14/2008  . Hyperlipidemia 05/10/2007  . Essential hypertension 01/18/2007  . HYPOKALEMIA 04/30/2006  . Body mass index 60.0-69.9, adult ([place]) 04/30/2006  . HX, PERSONAL, PEPTIC ULCER DISEASE 04/30/2006    Past Surgical History:  Procedure Laterality Date  . ABDOMINAL HYSTERECTOMY    . CARDIAC CATHETERIZATION  2002   non obstructive disease  . colonoscopy with polypectomy  2007 & 2012    hyperplastic ;Dr Watt Climes  .  COLONOSCOPY WITH PROPOFOL N/A 06/04/2015   Procedure: COLONOSCOPY WITH PROPOFOL;  Surgeon: Jerene Bears, MD;  Location: [facility];  Service: Gastroenterology;  Laterality: N/A;  . DEBRIDEMENT LEG Right 03/02/2018   WOUND VAC APPLIED  . DEBRIDEMENT LEG Right 05/27/2018   RIGHT LOWER LEG DEBRIDEMENT,  SKIN GRAFT, VAC PLACEMENT   . DILATION AND CURETTAGE OF UTERUS     multiple  . HEMORRHOID SURGERY    . I&D EXTREMITY Right 03/02/2018   Procedure: RIGHT LEG DEBRIDEMENT AND PLACE VAC;  Surgeon: [name], MD;  Location: [facility];  Service: Orthopedics;  Laterality: Right;  . I&D EXTREMITY Right 03/04/2018   Procedure: REPEAT IRRIGATION AND DEBRIDEMENT RIGHT LEG, PLACE WOUND VAC;  Surgeon: [name], MD;  Location: [facility];  Service: Orthopedics;  Laterality: Right;  . I&D EXTREMITY Right 03/30/2018   Procedure: IRRIGATION AND DEBRIDEMENT RIGHT LEG, APPLY WOUND VAC;  Surgeon: [name], MD;  Location: [facility];  Service: Orthopedics;  Laterality: Right;  . I&D EXTREMITY Right 05/27/2018   Procedure: RIGHT LOWER LEG DEBRIDEMENT,  SKIN GRAFT, VAC PLACEMENT;  Surgeon: [name], MD;  Location: [facility];  Service: Orthopedics;  Laterality: Right;  RIGHT LOWER LEG DEBRIDEMENT,  SKIN GRAFT, VAC PLACEMENT  . renal calculi  12/1997   SVT with induction of anesthesia  . right knee arthroscopy    . SKIN SPLIT GRAFT Right 03/04/2018   Procedure: POSSIBLE SPLIT THICKNESS SKIN GRAFT;  Surgeon: [name], MD;  Location: [facility];  Service: Orthopedics;  Laterality: Right;  . SKIN SPLIT GRAFT Right 04/01/2018   Procedure: REPEAT IRRIGATION AND DEBRIDEMENT RIGHT LEG, APPLY SPLIT THICKNESS SKIN GRAFT;  Surgeon: [name], MD;  Location: [facility];  Service: Orthopedics;  Laterality: Right;  . TONSILLECTOMY AND ADENOIDECTOMY       OB History   No obstetric history on file.      Home Medications    Prior to Admission medications   Medication Sig Start Date End Date Taking? Authorizing Provider   albuterol (PROVENTIL HFA) 108 (90 Base) MCG/ACT inhaler Inhale 2 puffs into the lungs every 4 (four) hours as needed for wheezing or shortness of breath. 09/11/16 09/27/18 Yes Burns, Claudina Lick, MD  albuterol (PROVENTIL) (2.5 MG/3ML) 0.083% nebulizer solution Take 3 mLs (2.5 mg total) by nebulization every 6 (six) hours as needed for wheezing or shortness of breath. 03/23/17  Yes Burns, Claudina Lick, MD  allopurinol (ZYLOPRIM) 100 MG tablet Take 1 tablet (100 mg total) by mouth daily. 08/29/18  Yes Burns, Claudina Lick, MD  aspirin (ECOTRIN LOW STRENGTH) 81 MG EC tablet Take 81 mg by mouth at bedtime.    Yes [provider]  budesonide-formoterol (SYMBICORT) 160-4.5 MCG/ACT inhaler one - two inhalations every 12 hours; gargle and spit after use Patient taking differently: Inhale  2 puffs into the lungs 2 (two) times daily as needed (shortness of breath). gargle and spit after use 02/19/15  Yes Burns, Claudina Lick, MD  carvedilol (COREG) 25 MG tablet TAKE 1 TABLET BY MOUTH TWICE DAILY WITH A MEAL Patient taking differently: Take 25 mg by mouth daily.  07/18/18  Yes Burns, Claudina Lick, MD  COD LIVER OIL PO Take 1 capsule by mouth daily.   Yes [provider]  colchicine 0.6 MG tablet Take 1 tablet (0.6 mg total) by mouth daily. 08/24/18  Yes Thurnell Lose, MD  cyanocobalamin (,VITAMIN B-12,) 1000 MCG/ML injection INJECT 1ML INTO MUSCLE EVERY 30 DAYS Patient taking differently: Inject 1,000 mcg into the muscle every 30 (thirty) days.  05/06/18  Yes Burns, Claudina Lick, MD  diclofenac sodium (VOLTAREN) 1 % GEL Apply 2 g topically 4 (four) times daily. Patient taking differently: Apply 2 g topically 4 (four) times daily as needed (pain).  03/20/16  Yes Marcial Pacas, MD  famotidine (PEPCID) 20 MG tablet Take 1 tablet (20 mg total) by mouth 2 (two) times daily. 12/14/17  Yes Burns, Claudina Lick, MD  Flaxseed, Linseed, (FLAX SEEDS PO) Take 1 capsule by mouth 2 (two) times a day.   Yes [provider]  folic acid (FOLVITE)  824 MCG tablet Take 400 mcg by mouth at bedtime.    Yes [provider]  furosemide (LASIX) 80 MG tablet 1.5 tablets in morning, 1 tablet in afternoon Patient taking differently: Take 80-120 mg by mouth 2 (two) times daily. 1.5 tablets in morning, 1 tablet in afternoon 08/30/18  Yes Burns, Claudina Lick, MD  gabapentin (NEURONTIN) 100 MG capsule Take 1 capsule (100 mg total) by mouth 3 (three) times daily. 08/23/18 09/22/18 Yes Thurnell Lose, MD  montelukast (SINGULAIR) 10 MG tablet Take 1 tablet (10 mg total) by mouth at bedtime. 08/12/18  Yes Padgett, Rae Halsted, MD  Multiple Vitamin (MULTIVITAMIN WITH MINERALS) TABS tablet Take 1 tablet by mouth daily.   Yes [provider]  Omega-3 Fatty Acids (FISH OIL) 1000 MG CAPS Take 1,000 mg by mouth daily.    Yes [provider]  Oxcarbazepine (TRILEPTAL) 300 MG tablet Take 1 tablet (300 mg total) by mouth 2 (two) times daily. 08/23/18  Yes Thurnell Lose, MD  oxyCODONE-acetaminophen (PERCOCET) 5-325 MG tablet Take 1 tablet by mouth every 6 (six) hours as needed for moderate pain or severe pain. 08/31/18 08/31/19 Yes Dondra Prader R, NP  potassium chloride SA (K-DUR,KLOR-CON) 20 MEQ tablet TAKE 3 TABLETS BY MOUTH DAILY. Patient taking differently: Take 40 mEq by mouth 2 (two) times daily.  02/25/18  Yes Burns, Claudina Lick, MD  simvastatin (ZOCOR) 40 MG tablet Take 1 tablet (40 mg total) by mouth at bedtime. 04/25/18  Yes Burns, Claudina Lick, MD  spironolactone (ALDACTONE) 25 MG tablet TAKE 1 TABLET BY MOUTH ONCE DAILY Patient taking differently: Take 25 mg by mouth daily.  01/12/18  Yes Burns, Claudina Lick, MD  calcium carbonate (TUMS - DOSED IN MG ELEMENTAL CALCIUM) 500 MG chewable tablet Chew 2 tablets by mouth daily as needed for indigestion or heartburn.    [provider]  diphenhydrAMINE (BENADRYL) 12.5 MG/5ML liquid Take 25 mg by mouth daily as needed for itching.    [provider]  docusate sodium (COLACE) 100 MG  capsule Take 1 capsule (100 mg total) by mouth 2 (two) times daily. Patient not taking: Reported on 09/07/2018 03/09/18   Rayburn, Neta Mends, PA-C  EPINEPHrine 0.3  mg/0.3 mL IJ SOAJ injection Inject 0.3 mLs (0.3 mg total) into the muscle once as needed for anaphylaxis. 08/12/18   Kennith Gain, MD  hydrOXYzine (ATARAX/VISTARIL) 25 MG tablet Take 25 mg by mouth every 6 (six) hours as needed for itching.     [provider]  Lancets [facility] ULTRASOFT) lancets Use to help check blood sugars twice a day Dx E11.9 04/29/15   [name], MD  permethrin (ELIMITE) 5 % cream Apply 1 application topically as needed. Hives and rash on arms and chest 08/13/18   [provider]  silver sulfADIAZINE (SILVADENE) 1 % cream Apply 1 application topically daily. 06/08/18   [name], MD  SYRINGE-NEEDLE, DISP, 3 ML (B-D 3CC LUER-LOK SYR 23GX1-1/2) 23G X 1-1/2" 3 ML MISC USE TO INJECT B-12 ONCE MONTHLY 08/15/18   Burns, Claudina Lick, MD  SYRINGE-NEEDLE, DISP, 3 ML 23G X 1" 3 ML MISC Use with B12 to inject IM every 30 days 05/06/16   [name], MD    Family History Family History  Problem Relation Age of Onset  . Colon cancer Mother   . Prostate cancer Father   . Colon cancer Father   . Diabetes Maternal Aunt   . Breast cancer Maternal Aunt   . Diabetes Maternal Uncle   . Diabetes Paternal Aunt   . Stroke Paternal Aunt        > 65  . Heart disease Paternal Aunt   . Diabetes Paternal Uncle   . Breast cancer Maternal Aunt         X 2  . Breast cancer Cousin     Social History Social History   Tobacco Use  . Smoking status: Never Smoker  . Smokeless tobacco: Never Used  Substance Use Topics  . Alcohol use: No    Alcohol/week: 0.0 standard drinks  . Drug use: No     Allergies   Penicillins, Shellfish allergy, Sulfonamide derivatives, Hydrochlorothiazide w-triamterene, Lotensin [benazepril hcl], Dipyridamole, Estrogens, Hydrochlorothiazide, Metronidazole, Other,  Spironolactone, Sulfa antibiotics, Torsemide, Valsartan, and Mustard [allyl isothiocyanate]   Review of Systems Review of Systems  Musculoskeletal:       Legs swelling  Skin: Positive for wound.  All other systems reviewed and are negative.    Physical Exam Updated Vital Signs BP (!) 145/62   Pulse 65   Temp 98.4 F (36.9 C) (Oral)   Resp 14   SpO2 100%   Physical Exam Vitals signs and nursing note reviewed.  Constitutional:      Appearance: Normal appearance. She is obese.  HENT:     Head: Normocephalic and atraumatic.     Right Ear: External ear normal.     Left Ear: External ear normal.     Nose: Nose normal.     Mouth/Throat:     Mouth: Mucous membranes are moist.     Pharynx: Oropharynx is clear.  Eyes:     Extraocular Movements: Extraocular movements intact.     Conjunctiva/sclera: Conjunctivae normal.     Pupils: Pupils are equal, round, and reactive to light.  Neck:     Musculoskeletal: Normal range of motion and neck supple.  Cardiovascular:     Rate and Rhythm: Normal rate and regular rhythm.     Pulses: Normal pulses.     Heart sounds: Normal heart sounds.  Pulmonary:     Effort: Pulmonary effort is normal.     Breath sounds: Normal breath sounds.  Abdominal:     General:  Abdomen is flat. Bowel sounds are normal.     Palpations: Abdomen is soft.  Musculoskeletal:     Right lower leg: Edema present.     Left lower leg: Edema present.     Comments: Tenderness to both legs  Skin:    General: Skin is warm.     Capillary Refill: Capillary refill takes less than 2 seconds.     Comments: See picture  Neurological:     General: No focal deficit present.     Mental Status: She is alert and oriented to person, place, and time.  Psychiatric:        Mood and Affect: Mood normal.        Behavior: Behavior normal.        Thought Content: Thought content normal.        Judgment: Judgment normal.        ED Treatments / Results  Labs (all labs  ordered are listed, but only abnormal results are displayed) Labs Reviewed  BASIC METABOLIC PANEL - Abnormal; Notable for the following components:      Result Value   Glucose, Bld 103 (*)    BUN 28 (*)    Creatinine, Ser 2.04 (*)    GFR calc non Af Amer 24 (*)    GFR calc Af Amer 28 (*)    All other components within normal limits  CBC WITH DIFFERENTIAL/PLATELET - Abnormal; Notable for the following components:   RBC 3.69 (*)    Hemoglobin 10.7 (*)    HCT 35.6 (*)    All other components within normal limits  SARS CORONAVIRUS 2 (HOSPITAL ORDER, [facility])  URINALYSIS, ROUTINE W REFLEX MICROSCOPIC    EKG None  Radiology Dg Tibia/fibula Right  Result Date: 09/07/2018 CLINICAL DATA:  Pain and lower extremity swelling EXAM: RIGHT TIBIA AND FIBULA - 2 VIEW COMPARISON:  February 15, 2018 FINDINGS: Again noted is diffuse subcutaneous edema seen surrounding the lower extremity. Surgical staples seen. No fracture or dislocation is seen. Right knee osteoarthritis is noted. IMPRESSION: Diffuse subcutaneous edema.  No acute osseous abnormality. Electronically Signed   By: Prudencio Pair M.D.   On: 09/07/2018 16:19   Dg Ankle Complete Left  Result Date: 09/07/2018 CLINICAL DATA:  Chronic bilateral leg wounds. EXAM: LEFT ANKLE COMPLETE - 3+ VIEW COMPARISON:  Left ankle dated 08/20/2018. Left foot radiographs obtained today. FINDINGS: Progressive diffuse soft tissue swelling. Interval ill-defined destruction of the anterior aspect of the calcaneus inferiorly. Mild talotibial spur formation posteriorly. Moderate-sized inferior calcaneal spur. IMPRESSION: 1. Interval bone destruction suspicious for osteomyelitis involving the anterior aspect of the calcaneus. 2. Progressive diffuse soft tissue swelling. Electronically Signed   By: Claudie Revering M.D.   On: 09/07/2018 16:54   Dg Foot Complete Left  Result Date: 09/07/2018 CLINICAL DATA:  Bilateral chronic leg wounds with pain and  difficulty walking. EXAM: LEFT FOOT - COMPLETE 3+ VIEW COMPARISON:  Left ankle radiographs obtained today. Left ankle radiographs dated 08/20/2018. FINDINGS: Again demonstrated is ill-defined bone destruction involving the distal aspect of the calcaneus inferiorly. This was not present on 08/20/2018. Moderate-sized inferior calcaneal spur formation. No soft tissue gas. IMPRESSION: Probable osteomyelitis involving the distal aspect of the calcaneus. Electronically Signed   By: Claudie Revering M.D.   On: 09/07/2018 16:56    Procedures Procedures (including critical care time)  Medications Ordered in ED Medications  ciprofloxacin (CIPRO) IVPB 400 mg (has no administration in time range)  vancomycin (VANCOCIN) 2,500  mg in sodium chloride 0.9 % 500 mL IVPB (has no administration in time range)  morphine 4 MG/ML injection 4 mg (4 mg Intravenous Given 09/07/18 1540)  ondansetron (ZOFRAN) injection 4 mg (4 mg Intravenous Given 09/07/18 1540)  clindamycin (CLEOCIN) IVPB 600 mg (600 mg Intravenous New Bag/Given 09/07/18 1743)     Initial Impression / Assessment and Plan / ED Course  I have reviewed the triage vital signs and the nursing notes.  Pertinent labs & imaging results that were available during my care of the patient were reviewed by me and considered in my medical decision making (see chart for details).     Pt has possible osteomyelitis of her left heel.  It is very tender to touch there.  She is started on clinda, cipro, and vancomycin.  She is unable to walk.  She did become hypoxic after morphine likely due to OSA.  The pt was put on 2L oxygen and O2 sats are in the upper 90s.    Pt d/w Dr. Darrick Meigs (triad) for admission.  Pt also d/w Dr. Erlinda Hong who is on call for Dr. [name].  He said he'd have Dr. [name] see pt in the morning.  He requests [facility] admission.  Final Clinical Impressions(s) / ED Diagnoses   Final diagnoses:  Other acute osteomyelitis of left foot ([place])  Neuropathy  Chronic  ulcer of right leg, limited to breakdown of skin [facility])  Ambulatory dysfunction  Morbid obesity [facility])    ED Discharge Orders    None       Isla Pence, MD 09/07/18 1741    Isla Pence, MD 09/07/18 Vernelle Emerald

## 2018-09-07 NOTE — ED Notes (Signed)
Patient O2 85% on room air while sleeping. Placed on 2L [place]. 95% on 2L. MD made aware.

## 2018-09-07 NOTE — Progress Notes (Signed)
Pharmacy Antibiotic Note  April [name] is a 68 y.o. female admitted on 09/07/2018 with osteomyelitis.  Pharmacy has been consulted for vancomycin and aztreonam dosing.  Plan: Vancomycin 2500mg  x 1 in ED then 1750mg  q48h (AUC 475.3, Scr 2.04) Aztreonam 2gm IV q8h Follow renal function, cultures and clinical course     Temp (24hrs), Avg:98.4 F (36.9 C), Min:98.4 F (36.9 C), Max:98.4 F (36.9 C)  Recent Labs  Lab 09/07/18 1530  WBC 4.1  CREATININE 2.04*    Estimated Creatinine Clearance: 40 mL/min (A) (by C-G formula based on SCr of 2.04 mg/dL (H)).    Allergies  Allergen Reactions  . Penicillins Rash and Other (See Comments)    She was told not to take it anymore. Has patient had a PCN reaction causing immediate rash, facial/tongue/throat swelling, SOB or lightheadedness with hypotension: Yes Has patient had a PCN reaction causing severe rash involving mucus membranes or skin necrosis: No Has patient had a PCN reaction that required hospitalization: No Has patient had a PCN reaction occurring within the last 10 years: No If all of the above answers are "NO", then may proceed with Cephalosporin use.'  . Shellfish Allergy Anaphylaxis  . Sulfonamide Derivatives Anaphylaxis and Other (See Comments)    REACTION: internal "burning"  . Hydrochlorothiazide W-Triamterene Other (See Comments)    Hypokalemia  . Lotensin [Benazepril Hcl] Hives  . Dipyridamole Other (See Comments)    Unknown reaction  . Estrogens Other (See Comments)    Unknown reaction  . Hydrochlorothiazide Other (See Comments)    Unknown reaction  . Metronidazole Other (See Comments)    Unknown reaction  . Other     Pickles-itching  . Spironolactone Other (See Comments)    UNSPECIFIED > "kidney problems"  . Sulfa Antibiotics Other (See Comments)    Unknown reaction  . Torsemide Other (See Comments)    Unknown reaction  . Valsartan Other (See Comments)    Unknown reaction  . Madelaine Bhat  Isothiocyanate] Itching    Antimicrobials this admission: 8/5 cipro x 1 8/5 clinda x 1 8/5 vancomycin >> 8/5 aztreonam >> Dose adjustments this admission:   Microbiology results:   Thank you for allowing pharmacy to be a part of this patient's care.  Dolly Rias RPh 09/07/2018, 6:28 PM Pager [phone]

## 2018-09-07 NOTE — Progress Notes (Signed)
A consult was received from an ED physician for vancomycin per pharmacy dosing.  The patient's profile has been reviewed for ht/wt/allergies/indication/available labs.    An order has been placed for vanc 2500mg   x1  Further antibiotics/pharmacy consults should be ordered by admitting physician if indicated.                       Thank you, Dolly Rias RPh 09/07/2018, 5:10 PM Pager [phone]

## 2018-09-07 NOTE — ED Triage Notes (Signed)
Per EMS, patient from home, reports pain to bilateral legs with chronic wounds making it difficult to walk this morning. Home health nurse at the house upon [facility] arrival, patient unable to lift legs for wound care because of pain. Hx diabetic neuropathy. Reports starting gabapentin x1 week ago with no relief. Patient lives alone.

## 2018-09-07 NOTE — Telephone Encounter (Signed)
Lenell Antu, LPN from [facility] called stating that she came out for wound care but the pt is complaining of bilateral and foot pain; she had to help the pt get to the restroom because she is unable to walk; states pain is 10 out of 10, and the pt is weak, and is unable to hold things in her hand, stand, and has tremors; this started when she started taking gabapentin around 08/23/2018; she feels like needles are sticking her in both feet and legs; recommendations made per nurse triage protocol; she verbalized understanding; will route to office for notification; the pt sees Dr. Reggy Eye.  Reason for Disposition . Unable to walk  Answer Assessment - Initial Assessment Questions 1. ONSET: "When did the pain start?"      08/23/2018 2. LOCATION: "Where is the pain located?"      Both legs (upper and lower to feet) 3. PAIN: "How bad is the pain?"    (Scale 1-10; or mild, moderate, severe)   -  MILD (1-3): doesn't interfere with normal activities    -  MODERATE (4-7): interferes with normal activities (e.g., work or school) or awakens from sleep, limping    -  SEVERE (8-10): excruciating pain, unable to do any normal activities, unable to walk     10 out of 10 4. WORK OR EXERCISE: "Has there been any recent work or exercise that involved this part of the body?"      no 5. CAUSE: "What do you think is causing the leg pain?"     Pain since started gabapentin 6. OTHER SYMPTOMS: "Do you have any other symptoms?" (e.g., chest pain, back pain, breathing difficulty, swelling, rash, fever, numbness, weakness)     Weakness, tremors 7. PREGNANCY: "Is there any chance you are pregnant?" "When was your last menstrual period?"     no  Protocols used: LEG PAIN-A-AH

## 2018-09-08 ENCOUNTER — Other Ambulatory Visit: Payer: Self-pay

## 2018-09-08 ENCOUNTER — Encounter (HOSPITAL_COMMUNITY): Payer: Self-pay | Admitting: *Deleted

## 2018-09-08 DIAGNOSIS — J449 Chronic obstructive pulmonary disease, unspecified: Secondary | ICD-10-CM

## 2018-09-08 DIAGNOSIS — E1169 Type 2 diabetes mellitus with other specified complication: Secondary | ICD-10-CM | POA: Diagnosis not present

## 2018-09-08 DIAGNOSIS — S86012A Strain of left Achilles tendon, initial encounter: Secondary | ICD-10-CM | POA: Diagnosis not present

## 2018-09-08 DIAGNOSIS — L97911 Non-pressure chronic ulcer of unspecified part of right lower leg limited to breakdown of skin: Secondary | ICD-10-CM | POA: Diagnosis not present

## 2018-09-08 DIAGNOSIS — Z6841 Body Mass Index (BMI) 40.0 and over, adult: Secondary | ICD-10-CM

## 2018-09-08 DIAGNOSIS — I10 Essential (primary) hypertension: Secondary | ICD-10-CM

## 2018-09-08 LAB — GLUCOSE, CAPILLARY
Glucose-Capillary: 106 mg/dL — ABNORMAL HIGH (ref 70–99)
Glucose-Capillary: 129 mg/dL — ABNORMAL HIGH (ref 70–99)
Glucose-Capillary: 74 mg/dL (ref 70–99)
Glucose-Capillary: 85 mg/dL (ref 70–99)
Glucose-Capillary: 85 mg/dL (ref 70–99)
Glucose-Capillary: 87 mg/dL (ref 70–99)

## 2018-09-08 LAB — CBC
HCT: 33.3 % — ABNORMAL LOW (ref 36.0–46.0)
Hemoglobin: 10.3 g/dL — ABNORMAL LOW (ref 12.0–15.0)
MCH: 28.9 pg (ref 26.0–34.0)
MCHC: 30.9 g/dL (ref 30.0–36.0)
MCV: 93.5 fL (ref 80.0–100.0)
Platelets: 134 10*3/uL — ABNORMAL LOW (ref 150–400)
RBC: 3.56 MIL/uL — ABNORMAL LOW (ref 3.87–5.11)
RDW: 14.9 % (ref 11.5–15.5)
WBC: 4 10*3/uL (ref 4.0–10.5)
nRBC: 0 % (ref 0.0–0.2)

## 2018-09-08 LAB — HEMOGLOBIN A1C
Hgb A1c MFr Bld: 6 % — ABNORMAL HIGH (ref 4.8–5.6)
Mean Plasma Glucose: 125.5 mg/dL

## 2018-09-08 LAB — CREATININE, SERUM
Creatinine, Ser: 1.76 mg/dL — ABNORMAL HIGH (ref 0.44–1.00)
GFR calc Af Amer: 34 mL/min — ABNORMAL LOW (ref >60)
GFR calc non Af Amer: 29 mL/min — ABNORMAL LOW (ref >60)

## 2018-09-08 LAB — URIC ACID: Uric Acid, Serum: 12.5 mg/dL — ABNORMAL HIGH (ref 2.5–7.1)

## 2018-09-08 MED ORDER — COLLAGENASE 250 UNIT/GM EX OINT
TOPICAL_OINTMENT | Freq: Every day | CUTANEOUS | Status: DC
  Administered 2018-09-08 – 2018-09-14 (×7): via TOPICAL
  Filled 2018-09-08: qty 30

## 2018-09-08 MED ORDER — TRAMADOL HCL 50 MG PO TABS
50.0000 mg | ORAL_TABLET | Freq: Once | ORAL | Status: AC | PRN
  Administered 2018-09-08: 50 mg via ORAL
  Filled 2018-09-08: qty 1

## 2018-09-08 MED ORDER — ENOXAPARIN SODIUM 80 MG/0.8ML ~~LOC~~ SOLN
80.0000 mg | SUBCUTANEOUS | Status: DC
  Administered 2018-09-09 – 2018-09-15 (×7): 80 mg via SUBCUTANEOUS
  Filled 2018-09-08 (×7): qty 0.8

## 2018-09-08 NOTE — Consult Note (Signed)
[place] Nurse wound consult note Reason for Consult: chronic, nonhealing RLE wound (full thickness).  Patient is well known to our team and is simultaneously consulted with Dr. [name], her Orthopedic surgeon and wound specialist.  Dr. [name] has ordered collagenase [facility]) for the full thickness wound on the RLE to be topped with dry gauze and secured with an ACE bandage with daily changes. Dr. [name] and I have communicated via Secure Chat this am regarding this patient and her continued wound care.  [place] nursing team will not follow, but will remain available to this patient, the nursing and medical teams.  Please re-consult if needed. Thanks, Maudie Flakes, MSN, RN, [place], Arther Abbott  Pager# [phone]

## 2018-09-08 NOTE — Consult Note (Signed)
ORTHOPAEDIC CONSULTATION  REQUESTING PHYSICIAN: [place] Eric J, DO  Chief Complaint: Left Achilles pain.  HPI: April [name] is a 68 y.o. female who presents with left Achilles pain.  Patient states that she was sitting on the toilet went to get up and felt and heard a pop.  Patient has a chronic history of venous insufficiency and is undergone skin grafting and wound care for the right lower extremity.    Past Medical History:  Diagnosis Date  . Anemia   . Asthma   . CAD (coronary artery disease)   . Carpal tunnel syndrome   . Cellulitis of both lower extremities 04/11/2015  . CHF (congestive heart failure) ([place])   . Chronic kidney disease    [facility]- followed by [facility]  . Colon polyp, hyperplastic 2007 & 2012  . Complication of anesthesia 1999   svt with renal calculi surgery, no problems since  . CTS (carpal tunnel syndrome)    bilateral  . Diabetes mellitus   . Eczema   . GERD (gastroesophageal reflux disease)   . History of kidney stones 1999  . Hyperlipidemia   . Hypertension   . Leg ulcer ([place]) 04/24/2015   Right lateral leg No evidence of an infection Monitor closely Keep edema controlled   . Leg ulcer ([place])    right lower  . Meralgia paresthetica    Dr. [name]  . Morbid obesity ([place])   . Morbid obesity ([place])   . Neuropathy    toes and legs  . Osteoarthrosis, unspecified whether generalized or localized, lower leg    knee  . PUD (peptic ulcer disease)   . Shortness of breath dyspnea    with exertion  . Sleep apnea    per progress note 02/25/2018  . Type II or unspecified type diabetes mellitus without mention of complication, not stated as uncontrolled   . Unspecified hereditary and idiopathic peripheral neuropathy   . Urticaria   . Vitamin B12 deficiency   . Wears glasses   . Wound cellulitis    right upper leg, healing well   Past Surgical History:  Procedure Laterality Date  . ABDOMINAL HYSTERECTOMY    . CARDIAC CATHETERIZATION  2002   non  obstructive disease  . colonoscopy with polypectomy  2007 & 2012    hyperplastic ;Dr Watt Climes  . COLONOSCOPY WITH PROPOFOL N/A 06/04/2015   Procedure: COLONOSCOPY WITH PROPOFOL;  Surgeon: Jerene Bears, MD;  Location: [facility];  Service: Gastroenterology;  Laterality: N/A;  . DEBRIDEMENT LEG Right 03/02/2018   WOUND VAC APPLIED  . DEBRIDEMENT LEG Right 05/27/2018   RIGHT LOWER LEG DEBRIDEMENT,  SKIN GRAFT, VAC PLACEMENT   . DILATION AND CURETTAGE OF UTERUS     multiple  . HEMORRHOID SURGERY    . I&D EXTREMITY Right 03/02/2018   Procedure: RIGHT LEG DEBRIDEMENT AND PLACE VAC;  Surgeon: [name], MD;  Location: [place];  Service: Orthopedics;  Laterality: Right;  . I&D EXTREMITY Right 03/04/2018   Procedure: REPEAT IRRIGATION AND DEBRIDEMENT RIGHT LEG, PLACE WOUND VAC;  Surgeon: [name], MD;  Location: [place];  Service: Orthopedics;  Laterality: Right;  . I&D EXTREMITY Right 03/30/2018   Procedure: IRRIGATION AND DEBRIDEMENT RIGHT LEG, APPLY WOUND VAC;  Surgeon: [name], MD;  Location: [place];  Service: Orthopedics;  Laterality: Right;  . I&D EXTREMITY Right 05/27/2018   Procedure: RIGHT LOWER LEG DEBRIDEMENT,  SKIN GRAFT, VAC PLACEMENT;  Surgeon: [name], MD;  Location: [facility]  OR;  Service: Orthopedics;  Laterality: Right;  RIGHT LOWER LEG DEBRIDEMENT,  SKIN GRAFT, VAC PLACEMENT  . renal calculi  12/1997   SVT with induction of anesthesia  . right knee arthroscopy    . SKIN SPLIT GRAFT Right 03/04/2018   Procedure: POSSIBLE SPLIT THICKNESS SKIN GRAFT;  Surgeon: [name], MD;  Location: [place];  Service: Orthopedics;  Laterality: Right;  . SKIN SPLIT GRAFT Right 04/01/2018   Procedure: REPEAT IRRIGATION AND DEBRIDEMENT RIGHT LEG, APPLY SPLIT THICKNESS SKIN GRAFT;  Surgeon: [name], MD;  Location: [place];  Service: Orthopedics;  Laterality: Right;  . TONSILLECTOMY AND ADENOIDECTOMY     Social History   Socioeconomic History  . Marital status: Married    Spouse  name: [name]  . Number of children: 1  . Years of education: BS  . Highest education level: Not on file  Occupational History  . Occupation: Disabled    Employer: RETIRED  Social Needs  . Financial resource strain: Not on file  . Food insecurity    Worry: Not on file    Inability: Not on file  . Transportation needs    Medical: Not on file    Non-medical: Not on file  Tobacco Use  . Smoking status: Never Smoker  . Smokeless tobacco: Never Used  Substance and Sexual Activity  . Alcohol use: No    Alcohol/week: 0.0 standard drinks  . Drug use: No  . Sexual activity: Not on file  Lifestyle  . Physical activity    Days per week: Not on file    Minutes per session: Not on file  . Stress: Not on file  Relationships  . Social Herbalist on phone: Not on file    Gets together: Not on file    Attends religious service: Not on file    Active member of club or organization: Not on file    Attends meetings of clubs or organizations: Not on file    Relationship status: Not on file  Other Topics Concern  . Not on file  Social History Narrative   Patient lives at home with her husband [name] Patient is retired and has a Conservation officer, nature.    Caffeine - some times.   Right handed.   Family History  Problem Relation Age of Onset  . Colon cancer Mother   . Prostate cancer Father   . Colon cancer Father   . Diabetes Maternal Aunt   . Breast cancer Maternal Aunt   . Diabetes Maternal Uncle   . Diabetes Paternal Aunt   . Stroke Paternal Aunt        > 65  . Heart disease Paternal Aunt   . Diabetes Paternal Uncle   . Breast cancer Maternal Aunt         X 2  . Breast cancer Cousin    - negative except otherwise stated in the family history section Allergies  Allergen Reactions  . Penicillins Rash and Other (See Comments)    She was told not to take it anymore. Has patient had a PCN reaction causing immediate rash, facial/tongue/throat swelling, SOB or  lightheadedness with hypotension: Yes Has patient had a PCN reaction causing severe rash involving mucus membranes or skin necrosis: No Has patient had a PCN reaction that required hospitalization: No Has patient had a PCN reaction occurring within the last 10 years: No If all of the above answers are "NO", then may proceed with Cephalosporin  use.'  . Shellfish Allergy Anaphylaxis  . Sulfonamide Derivatives Anaphylaxis and Other (See Comments)    REACTION: internal "burning"  . Hydrochlorothiazide W-Triamterene Other (See Comments)    Hypokalemia  . Lotensin [Benazepril Hcl] Hives  . Dipyridamole Other (See Comments)    Unknown reaction  . Estrogens Other (See Comments)    Unknown reaction  . Hydrochlorothiazide Other (See Comments)    Unknown reaction  . Metronidazole Other (See Comments)    Unknown reaction  . Other     Pickles-itching  . Spironolactone Other (See Comments)    UNSPECIFIED > "kidney problems"  . Sulfa Antibiotics Other (See Comments)    Unknown reaction  . Torsemide Other (See Comments)    Unknown reaction  . Valsartan Other (See Comments)    Unknown reaction  . Madelaine Bhat Isothiocyanate] Itching   Prior to Admission medications   Medication Sig Start Date End Date Taking? Authorizing Provider  albuterol (PROVENTIL HFA) 108 (90 Base) MCG/ACT inhaler Inhale 2 puffs into the lungs every 4 (four) hours as needed for wheezing or shortness of breath. 09/11/16 09/27/18 Yes Burns, Claudina Lick, MD  albuterol (PROVENTIL) (2.5 MG/3ML) 0.083% nebulizer solution Take 3 mLs (2.5 mg total) by nebulization every 6 (six) hours as needed for wheezing or shortness of breath. 03/23/17  Yes Burns, Claudina Lick, MD  allopurinol (ZYLOPRIM) 100 MG tablet Take 1 tablet (100 mg total) by mouth daily. 08/29/18  Yes Burns, Claudina Lick, MD  aspirin (ECOTRIN LOW STRENGTH) 81 MG EC tablet Take 81 mg by mouth at bedtime.    Yes [provider]  budesonide-formoterol (SYMBICORT) 160-4.5 MCG/ACT  inhaler one - two inhalations every 12 hours; gargle and spit after use Patient taking differently: Inhale 2 puffs into the lungs 2 (two) times daily as needed (shortness of breath). gargle and spit after use 02/19/15  Yes Burns, Claudina Lick, MD  carvedilol (COREG) 25 MG tablet TAKE 1 TABLET BY MOUTH TWICE DAILY WITH A MEAL Patient taking differently: Take 25 mg by mouth daily.  07/18/18  Yes Burns, Claudina Lick, MD  COD LIVER OIL PO Take 1 capsule by mouth daily.   Yes [provider]  colchicine 0.6 MG tablet Take 1 tablet (0.6 mg total) by mouth daily. 08/24/18  Yes Thurnell Lose, MD  cyanocobalamin (,VITAMIN B-12,) 1000 MCG/ML injection INJECT 1ML INTO MUSCLE EVERY 30 DAYS Patient taking differently: Inject 1,000 mcg into the muscle every 30 (thirty) days.  05/06/18  Yes Burns, Claudina Lick, MD  diclofenac sodium (VOLTAREN) 1 % GEL Apply 2 g topically 4 (four) times daily. Patient taking differently: Apply 2 g topically 4 (four) times daily as needed (pain).  03/20/16  Yes Marcial Pacas, MD  famotidine (PEPCID) 20 MG tablet Take 1 tablet (20 mg total) by mouth 2 (two) times daily. 12/14/17  Yes Burns, Claudina Lick, MD  Flaxseed, Linseed, (FLAX SEEDS PO) Take 1 capsule by mouth 2 (two) times a day.   Yes [provider]  folic acid (FOLVITE) 262 MCG tablet Take 400 mcg by mouth at bedtime.    Yes [provider]  furosemide (LASIX) 80 MG tablet 1.5 tablets in morning, 1 tablet in afternoon Patient taking differently: Take 80-120 mg by mouth 2 (two) times daily. 1.5 tablets in morning, 1 tablet in afternoon 08/30/18  Yes Burns, Claudina Lick, MD  gabapentin (NEURONTIN) 100 MG capsule Take 1 capsule (100 mg total) by mouth 3 (three) times daily. 08/23/18 09/22/18 Yes Thurnell Lose, MD  montelukast (SINGULAIR) 10 MG tablet Take 1 tablet (10 mg total) by mouth at bedtime. 08/12/18  Yes Padgett, Rae Halsted, MD  Multiple Vitamin (MULTIVITAMIN WITH MINERALS) TABS tablet Take 1 tablet by mouth daily.    Yes [provider]  Omega-3 Fatty Acids (FISH OIL) 1000 MG CAPS Take 1,000 mg by mouth daily.    Yes [provider]  Oxcarbazepine (TRILEPTAL) 300 MG tablet Take 1 tablet (300 mg total) by mouth 2 (two) times daily. 08/23/18  Yes Thurnell Lose, MD  oxyCODONE-acetaminophen (PERCOCET) 5-325 MG tablet Take 1 tablet by mouth every 6 (six) hours as needed for moderate pain or severe pain. 08/31/18 08/31/19 Yes Dondra Prader R, NP  potassium chloride SA (K-DUR,KLOR-CON) 20 MEQ tablet TAKE 3 TABLETS BY MOUTH DAILY. Patient taking differently: Take 40 mEq by mouth 2 (two) times daily.  02/25/18  Yes Burns, Claudina Lick, MD  simvastatin (ZOCOR) 40 MG tablet Take 1 tablet (40 mg total) by mouth at bedtime. 04/25/18  Yes Burns, Claudina Lick, MD  spironolactone (ALDACTONE) 25 MG tablet TAKE 1 TABLET BY MOUTH ONCE DAILY Patient taking differently: Take 25 mg by mouth daily.  01/12/18  Yes Burns, Claudina Lick, MD  calcium carbonate (TUMS - DOSED IN MG ELEMENTAL CALCIUM) 500 MG chewable tablet Chew 2 tablets by mouth daily as needed for indigestion or heartburn.    [provider]  diphenhydrAMINE (BENADRYL) 12.5 MG/5ML liquid Take 25 mg by mouth daily as needed for itching.    [provider]  docusate sodium (COLACE) 100 MG capsule Take 1 capsule (100 mg total) by mouth 2 (two) times daily. Patient not taking: Reported on 09/07/2018 03/09/18   Rayburn, Neta Mends, PA-C  EPINEPHrine 0.3 mg/0.3 mL IJ SOAJ injection Inject 0.3 mLs (0.3 mg total) into the muscle once as needed for anaphylaxis. 08/12/18   Kennith Gain, MD  hydrOXYzine (ATARAX/VISTARIL) 25 MG tablet Take 25 mg by mouth every 6 (six) hours as needed for itching.     [provider]  Lancets [facility] ULTRASOFT) lancets Use to help check blood sugars twice a day Dx E11.9 04/29/15   [name], MD  permethrin (ELIMITE) 5 % cream Apply 1 application topically as needed. Hives and rash on arms and chest  08/13/18   [provider]  silver sulfADIAZINE (SILVADENE) 1 % cream Apply 1 application topically daily. 06/08/18   [name], MD  SYRINGE-NEEDLE, DISP, 3 ML (B-D 3CC LUER-LOK SYR 23GX1-1/2) 23G X 1-1/2" 3 ML MISC USE TO INJECT B-12 ONCE MONTHLY 08/15/18   Burns, Claudina Lick, MD  SYRINGE-NEEDLE, DISP, 3 ML 23G X 1" 3 ML MISC Use with B12 to inject IM every 30 days 05/06/16   [name], MD   Dg Tibia/fibula Right  Result Date: 09/07/2018 CLINICAL DATA:  Pain and lower extremity swelling EXAM: RIGHT TIBIA AND FIBULA - 2 VIEW COMPARISON:  February 15, 2018 FINDINGS: Again noted is diffuse subcutaneous edema seen surrounding the lower extremity. Surgical staples seen. No fracture or dislocation is seen. Right knee osteoarthritis is noted. IMPRESSION: Diffuse subcutaneous edema.  No acute osseous abnormality. Electronically Signed   By: Prudencio Pair M.D.   On: 09/07/2018 16:19   Dg Ankle Complete Left  Result Date: 09/07/2018 CLINICAL DATA:  Chronic bilateral leg wounds. EXAM: LEFT ANKLE COMPLETE - 3+ VIEW COMPARISON:  Left ankle dated 08/20/2018. Left foot radiographs obtained today. FINDINGS: Progressive diffuse soft tissue swelling. Interval ill-defined destruction of the anterior aspect of  the calcaneus inferiorly. Mild talotibial spur formation posteriorly. Moderate-sized inferior calcaneal spur. IMPRESSION: 1. Interval bone destruction suspicious for osteomyelitis involving the anterior aspect of the calcaneus. 2. Progressive diffuse soft tissue swelling. Electronically Signed   By: Claudie Revering M.D.   On: 09/07/2018 16:54   Dg Foot Complete Left  Result Date: 09/07/2018 CLINICAL DATA:  Bilateral chronic leg wounds with pain and difficulty walking. EXAM: LEFT FOOT - COMPLETE 3+ VIEW COMPARISON:  Left ankle radiographs obtained today. Left ankle radiographs dated 08/20/2018. FINDINGS: Again demonstrated is ill-defined bone destruction involving the distal aspect of the calcaneus inferiorly.  This was not present on 08/20/2018. Moderate-sized inferior calcaneal spur formation. No soft tissue gas. IMPRESSION: Probable osteomyelitis involving the distal aspect of the calcaneus. Electronically Signed   By: Claudie Revering M.D.   On: 09/07/2018 16:56   - pertinent xrays, CT, MRI studies were reviewed and independently interpreted  Positive ROS: All other systems have been reviewed and were otherwise negative with the exception of those mentioned in the HPI and as above.  Physical Exam: General: Alert, no acute distress Psychiatric: Patient is competent for consent with normal mood and affect Lymphatic: No axillary or cervical lymphadenopathy Cardiovascular: No pedal edema Respiratory: No cyanosis, no use of accessory musculature GI: No organomegaly, abdomen is soft and non-tender    Images:  @ENCIMAGES @  Labs:  Lab Results  Component Value Date   HGBA1C 6.0 (H) 09/08/2018   HGBA1C 6.4 07/15/2018   HGBA1C 6.1 (H) 05/27/2018   ESRSEDRATE 42 (H) 08/12/2018   ESRSEDRATE 56 (H) 03/09/2018   ESRSEDRATE 13 06/02/2016   CRP 6.3 (H) 03/09/2018   CRP 2.2 06/02/2016   LABURIC 14.8 (H) 08/20/2018   REPTSTATUS 08/20/2018 FINAL 08/15/2018   GRAMSTAIN  05/27/2018    FEW WBC PRESENT, PREDOMINANTLY PMN MODERATE GRAM POSITIVE RODS RARE GRAM POSITIVE COCCI IN PAIRS    CULT  08/15/2018    NO GROWTH 5 DAYS Performed at [facility], [place] [address] [place], [place] [address]    LABORGA METHICILLIN RESISTANT STAPHYLOCOCCUS AUREUS 05/27/2018    Lab Results  Component Value Date   ALBUMIN 3.3 (L) 08/15/2018   ALBUMIN 3.7 07/15/2018   ALBUMIN 2.2 (L) 03/08/2018   LABURIC 14.8 (H) 08/20/2018    Neurologic: Patient does not have protective sensation bilateral lower extremities.   MUSCULOSKELETAL:   Skin: Examination patient has an improving wound on the right lower extremity.  There is a thin layer of fibrinous exudative tissue.  Examination of the both lower extremities  both feet and ankles are tender to palpation.  Patient has tenderness to palpation along the Achilles on the left side however there is no palpable defect.  There is no open wounds no cellulitis no signs of infection.  Pulses are not palpable due to the swelling in her feet.  Review of the radiographs does show some lytic changes in the left calcaneus with an osseous perinei.  I do not feel this is a acute bony change no clinical signs of infection at this location.  Assessment: Assessment: Diabetic insensate neuropathy, morbid obesity, with protein caloric malnutrition with a chronic venous ulcer on the right leg, with acute pain on the left Achilles possibly partial tear.  No clinical indication of osteomyelitis of the calcaneus.  Plan: Plan: Physical therapy for progressive ambulation weightbearing as tolerated, Silvadene dressing changes to the right lower extremity I will check a uric acid.   A fracture boot would normally be helpful for the  Achilles sprain to assist with ambulation.  However, I do not feel patient has been strength to use a fracture boot and the anatomy of her leg would not fit into a fracture boot.  I will follow-up as an outpatient.    Thank you for the consult and the opportunity to see Ms. Princella Ion, MD [place] [phone] 6:50 AM

## 2018-09-08 NOTE — Progress Notes (Signed)
Santyl medication applied to right lower leg.  Wound noted to the right lower leg, appears to be non healing.  Full thickness noted.  The patient reports this wound is chronic.  Both lower extremities are swollen and has redness.  Patient appears very obese and has difficulty moving around, even in bed

## 2018-09-08 NOTE — Progress Notes (Signed)
PROGRESS NOTE    April [name]  WRU:[id number] DOB: [birth year] DOA: 09/07/2018 PCP: [name], MD    Brief Narrative:   April [name]  is a 68 y.o. female, with history of asthma, CAD, type 2 diabetes mellitus, morbid obesity, chronic right lower extremity wound, hypertension who came to hospital after patient developed pain in both feet left more than right.  Pain she describes as sharp pain more in the left heel.  Patient has chronic right lower extremity wound and is being followed by Dr. [name].  She gets wound care nurses provide wound care 3 times a week.  Patient denies fever or chills. Denies nausea vomiting or diarrhea. Denies chest pain or shortness of breath. Patient has diabetes mellitus which is diet controlled as per patient.  In the ED x-ray of the left ankle showed possible osteomyelitis involving left calcaneus.  Patient started on IV antibiotics.  Orthopedics was consulted by ED physician and they recommended patient to be transferred to [place]:   Active Problems:   Morbid (severe) obesity due to excess calories (HCC)   Osteomyelitis of ankle or foot, acute, left (HCC)   Osteomyelitis of ankle or foot, left, acute (HCC)   Achilles tendon sprain, left, initial encounter   Left calcaneus lytic changes Patient transferred from [facility] hospital for concern of possible underlying calcaneal osteomyelitis.  Patient is afebrile without leukocytosis.  X-ray left foot personally reviewed, with some lytic changes in the calcaneus with osseous perinei.  Patient was initially started on broad-spectrum antibiotics with vancomycin and aztreonam.  Patient was evaluated by her primary orthopedist, Dr. [name]; who does not believe that there is any clinical indication of osteomyelitis of the left calcaneus.  Recommends physical therapy for ambulation and weightbearing as tolerated.  Silvadene/Santyl dressing changes and treatment to her right lower  extremity wound.  Unfortunately due to patient's body habitus a fracture boot unable to be fitted, which may have helped her with likely Achilles tear. --Discontinue antibiotics today. --PT/OT consult pending --Dr. [name] recommends outpatient follow-up  Type 2 diabetes mellitus Diet controlled at home.  Hemoglobin A1c 6.0. --Consistent carbohydrate diet --NovoLog insulin sliding scale for coverage if needed  CKD stage III Creatinine over the past year ranging from 1.32 to 2.45.  Creatinine on admission 2.04, appears to be within her baseline. --Cr 2.04-->1.76 --Avoid nephrotoxins, renally dose all medications  Chronic right leg ulceration Patient follows with wound care at home. --Wound care consulted, continue Santyl with dry gauze secured with Ace bandage with daily changes  Essential hypertension Blood pressure well controlled.  Continue carvedilol, spironolactone  COPD Not in acute exacerbation.  Continue montelukast, Dulera, as needed albuterol.  Morbid obesity BMI 60.44.  Discussed with patient that the majority of her chronic medical conditions points back to her underlying severe morbid obesity.  Discussed with her needs aggressive weight loss measures as this complicates all facets of care.  DVT prophylaxis: Lovenox Code Status: Full code Family Communication: None Disposition Plan: Continue observation status, pending PT/OT evaluation, may need SNF versus discharge back home.   Consultants:   Orthopedics, Dr. [name]  Procedures:   None  Antimicrobials:   Vancomycin 8/5 - 8/6  Aztreonam 8/5 - 8/6   Subjective: Patient seen and examined at bedside, resting comfortably in bed.  States has been having difficulty at home and unable to work with PT.  Recently discharged from [facility]; believes she may be have discharged too early.  No  other complaints or concerns at this time.  Was seen by orthopedic Dr. [name] this morning, no indication for acute osteomyelitis of  her left calcaneus.  Will discontinue antibiotics.  Pending PT/OT evaluation.  Patient denies headache, no fever/chills/night sweats, no nausea/vomiting/diarrhea, no chest pain, palpitations, no abdominal pain, no paresthesias.  No acute events overnight per nursing staff.  Objective: Vitals:   09/08/18 0235 09/08/18 0500 09/08/18 0728 09/08/18 1058  BP: (!) 101/52 (!) 110/58 (!) 114/57   Pulse: 75 80 88 88  Resp: 20 18 16 20   Temp: 98.4 F (36.9 C) 98.2 F (36.8 C) 99.4 F (37.4 C)   TempSrc: Oral Oral Oral   SpO2:  98% (!) 88% 95%  Weight: (!) 159.8 kg     Height: 5' 4.02" (1.626 m)       Intake/Output Summary (Last 24 hours) at 09/08/2018 1112 Last data filed at 09/08/2018 0900 Gross per 24 hour  Intake 1380 ml  Output 1000 ml  Net 380 ml   Filed Weights   09/07/18 2339 09/08/18 0235  Weight: (!) 159.8 kg (!) 159.8 kg    Examination:  General exam: Appears calm and comfortable, morbidly obese Respiratory system: Clear to auscultation. Respiratory effort normal. Cardiovascular system: S1 & S2 heard, RRR. No JVD, murmurs, rubs, gallops or clicks. No pedal edema. Gastrointestinal system: Abdomen is nondistended, protuberant abdomen, soft and nontender. No organomegaly or masses felt. Normal bowel sounds heard. Central nervous system: Alert and oriented. No focal neurological deficits. Extremities: Symmetric 5 x 5 power. Skin: Right lower extremity ulcerative lesion, see attached picture Psychiatry: Judgement and insight appear normal. Mood & affect appropriate.       Data Reviewed: I have personally reviewed following labs and imaging studies  CBC: Recent Labs  Lab 09/07/18 1530 09/08/18 0020  WBC 4.1 4.0  NEUTROABS 2.1  --   HGB 10.7* 10.3*  HCT 35.6* 33.3*  MCV 96.5 93.5  PLT 159 494*   Basic Metabolic Panel: Recent Labs  Lab 09/07/18 1530 09/08/18 0020  NA 141  --   K 4.2  --   CL 101  --   CO2 30  --   GLUCOSE 103*  --   BUN 28*  --   CREATININE  2.04* 1.76*  CALCIUM 9.2  --    GFR: Estimated Creatinine Clearance: 46.7 mL/min (A) (by C-G formula based on SCr of 1.76 mg/dL (H)). Liver Function Tests: No results for input(s): AST, ALT, ALKPHOS, BILITOT, PROT, ALBUMIN in the last 168 hours. No results for input(s): LIPASE, AMYLASE in the last 168 hours. No results for input(s): AMMONIA in the last 168 hours. Coagulation Profile: No results for input(s): INR, PROTIME in the last 168 hours. Cardiac Enzymes: No results for input(s): CKTOTAL, CKMB, CKMBINDEX, TROPONINI in the last 168 hours. BNP (last 3 results) No results for input(s): PROBNP in the last 8760 hours. HbA1C: Recent Labs    09/08/18 0020  HGBA1C 6.0*   CBG: Recent Labs  Lab 09/07/18 2332 09/08/18 0046 09/08/18 0155 09/08/18 0724  GLUCAP 74 74 87 85   Lipid Profile: No results for input(s): CHOL, HDL, LDLCALC, TRIG, CHOLHDL, LDLDIRECT in the last 72 hours. Thyroid Function Tests: No results for input(s): TSH, T4TOTAL, FREET4, T3FREE, THYROIDAB in the last 72 hours. Anemia Panel: No results for input(s): VITAMINB12, FOLATE, FERRITIN, TIBC, IRON, RETICCTPCT in the last 72 hours. Sepsis Labs: No results for input(s): PROCALCITON, LATICACIDVEN in the last 168 hours.  Recent Results (from the past 240  hour(s))  SARS Coronavirus 2 [facility] order, Performed in [facility] hospital lab) Nasopharyngeal Nasopharyngeal Swab     Status: None   Collection Time: 09/07/18  5:06 PM   Specimen: Nasopharyngeal Swab  Result Value Ref Range Status   SARS Coronavirus 2 NEGATIVE NEGATIVE Final    Comment: (NOTE) If result is NEGATIVE SARS-CoV-2 target nucleic acids are NOT DETECTED. The SARS-CoV-2 RNA is generally detectable in upper and lower  respiratory specimens during the acute phase of infection. The lowest  concentration of SARS-CoV-2 viral copies this assay can detect is 250  copies / mL. A negative result does not preclude SARS-CoV-2 infection  and should not be  used as the sole basis for treatment or other  patient management decisions.  A negative result may occur with  improper specimen collection / handling, submission of specimen other  than nasopharyngeal swab, presence of viral mutation(s) within the  areas targeted by this assay, and inadequate number of viral copies  (<250 copies / mL). A negative result must be combined with clinical  observations, patient history, and epidemiological information. If result is POSITIVE SARS-CoV-2 target nucleic acids are DETECTED. The SARS-CoV-2 RNA is generally detectable in upper and lower  respiratory specimens dur ing the acute phase of infection.  Positive  results are indicative of active infection with SARS-CoV-2.  Clinical  correlation with patient history and other diagnostic information is  necessary to determine patient infection status.  Positive results do  not rule out bacterial infection or co-infection with other viruses. If result is PRESUMPTIVE POSTIVE SARS-CoV-2 nucleic acids MAY BE PRESENT.   A presumptive positive result was obtained on the submitted specimen  and confirmed on repeat testing.  While 2019 novel coronavirus  (SARS-CoV-2) nucleic acids may be present in the submitted sample  additional confirmatory testing may be necessary for epidemiological  and / or clinical management purposes  to differentiate between  SARS-CoV-2 and other Sarbecovirus currently known to infect humans.  If clinically indicated additional testing with an alternate test  methodology [phone]) is advised. The SARS-CoV-2 RNA is generally  detectable in upper and lower respiratory sp ecimens during the acute  phase of infection. The expected result is Negative. Fact Sheet for Patients:  StrictlyIdeas.no Fact Sheet for Healthcare Providers: BankingDealers.co.za This test is not yet approved or cleared by the [place] FDA and has been authorized  for detection and/or diagnosis of SARS-CoV-2 by FDA under an Emergency Use Authorization (EUA).  This EUA will remain in effect (meaning this test can be used) for the duration of the COVID-19 declaration under Section 564(b)(1) of the Act, 21 U.S.C. section 360bbb-3(b)(1), unless the authorization is terminated or revoked sooner. Performed at [facility], [place] [address] [place], [place] [address]          Radiology Studies: Dg Tibia/fibula Right  Result Date: 09/07/2018 CLINICAL DATA:  Pain and lower extremity swelling EXAM: RIGHT TIBIA AND FIBULA - 2 VIEW COMPARISON:  February 15, 2018 FINDINGS: Again noted is diffuse subcutaneous edema seen surrounding the lower extremity. Surgical staples seen. No fracture or dislocation is seen. Right knee osteoarthritis is noted. IMPRESSION: Diffuse subcutaneous edema.  No acute osseous abnormality. Electronically Signed   By: Prudencio Pair M.D.   On: 09/07/2018 16:19   Dg Ankle Complete Left  Result Date: 09/07/2018 CLINICAL DATA:  Chronic bilateral leg wounds. EXAM: LEFT ANKLE COMPLETE - 3+ VIEW COMPARISON:  Left ankle dated 08/20/2018. Left foot radiographs obtained today. FINDINGS: Progressive diffuse  soft tissue swelling. Interval ill-defined destruction of the anterior aspect of the calcaneus inferiorly. Mild talotibial spur formation posteriorly. Moderate-sized inferior calcaneal spur. IMPRESSION: 1. Interval bone destruction suspicious for osteomyelitis involving the anterior aspect of the calcaneus. 2. Progressive diffuse soft tissue swelling. Electronically Signed   By: Claudie Revering M.D.   On: 09/07/2018 16:54   Dg Foot Complete Left  Result Date: 09/07/2018 CLINICAL DATA:  Bilateral chronic leg wounds with pain and difficulty walking. EXAM: LEFT FOOT - COMPLETE 3+ VIEW COMPARISON:  Left ankle radiographs obtained today. Left ankle radiographs dated 08/20/2018. FINDINGS: Again demonstrated is ill-defined bone destruction  involving the distal aspect of the calcaneus inferiorly. This was not present on 08/20/2018. Moderate-sized inferior calcaneal spur formation. No soft tissue gas. IMPRESSION: Probable osteomyelitis involving the distal aspect of the calcaneus. Electronically Signed   By: Claudie Revering M.D.   On: 09/07/2018 16:56        Scheduled Meds:  allopurinol  100 mg Oral Daily   aspirin EC  81 mg Oral QHS   carvedilol  25 mg Oral Q breakfast   colchicine  0.6 mg Oral Daily   collagenase   Topical Daily   enoxaparin (LOVENOX) injection  40 mg Subcutaneous Q24H   famotidine  20 mg Oral BID   fluticasone furoate-vilanterol  1 puff Inhalation Daily   folic acid  004 mcg Oral QHS   furosemide  120 mg Oral Q breakfast   furosemide  80 mg Oral q1800   gabapentin  100 mg Oral TID   insulin aspart  0-9 Units Subcutaneous TID WC   montelukast  10 mg Oral QHS   Oxcarbazepine  300 mg Oral BID   simvastatin  40 mg Oral QHS   spironolactone  25 mg Oral Daily   Continuous Infusions:  sodium chloride 75 mL/hr at 09/08/18 0042     LOS: 1 day    Time spent: 39 minutes spent on chart review, personally reviewed imaging studies and labs, discussion with nursing staff, consultants, updating family and interview/physical exam; more than 50% of that time was spent in counseling and/or coordination of care.    April Fullwood J [place] DO [facility] Pager [phone]  If 7PM-7AM, please contact night-coverage [email or web address] Password [email or web address] 09/08/2018, 11:12 AM

## 2018-09-08 NOTE — Care Management CC44 (Signed)
Condition Code 44 Documentation Completed  Patient Details  Name: April [name] MRN: [id number] Date of Birth: 03-May-1950   Condition Code 44 given:  Yes Patient signature on Condition Code 44 notice:  Yes Documentation of 2 MD's agreement:  Yes Code 44 added to claim:  Yes    Ninfa Meeker, RN 09/08/2018, 11:29 AM

## 2018-09-08 NOTE — Evaluation (Signed)
Physical Therapy Evaluation Patient Details Name: April [name] MRN: [id number] DOB: 1950-10-20 Today's Date: 09/08/2018   History of Present Illness  PT is a 68 y/o female admitted secondary to worsening LLE and L foot pain. Pt with RLE pain at baseline. Imaging revealed L calcaneous osteomyelitis. Pt also with likely L achilles strain vs partial tear. PMH includes obesity, CAD, asthma, HTN, and DM.   Clinical Impression  Pt admitted secondary to problem above with deficits below. Pt very limited secondary to BLE pain this session and was only able to attempt basic bed mobility tasks. Pt requiring max A +2 to roll from side to side this session. Pt grimacing and yelling out throughout. Pt with limited ROM in BLE and noted decreased coordination in R hand which pt reports is new. Feel pt will require increased assist at d/c. Will continue to follow acutely to maximize functional mobility independence and safety.     Follow Up Recommendations Supervision/Assistance - 24 hour;SNF;CIR(SNF vs CIR pending progression )    Equipment Recommendations  Other (comment)(TBD)    Recommendations for Other Services OT consult     Precautions / Restrictions Precautions Precautions: Fall Restrictions Weight Bearing Restrictions: Yes LLE Weight Bearing: Weight bearing as tolerated Other Position/Activity Restrictions: WBAT per ortho notes.       Mobility  Bed Mobility Overal bed mobility: Needs Assistance Bed Mobility: Rolling Rolling: Max assist;+2 for physical assistance         General bed mobility comments: Max A +2 to roll from side to side. Pt grimacing and yelling out during bed mobility tasks, so further mobility deferred.   Transfers                    Ambulation/Gait                Stairs            Wheelchair Mobility    Modified Rankin (Stroke Patients Only)       Balance                                              Pertinent Vitals/Pain Pain Assessment: Faces Faces Pain Scale: Hurts whole lot Pain Location: BLE  Pain Descriptors / Indicators: Grimacing;Guarding Pain Intervention(s): Limited activity within patient's tolerance;Monitored during session;Repositioned    Home Living Family/patient expects to be discharged to:: Private residence Living Arrangements: Spouse/significant other Available Help at Discharge: Family;Available PRN/intermittently Type of Home: House Home Access: Stairs to enter Entrance Stairs-Rails: Psychiatric nurse of Steps: 2 Home Layout: Two level Home Equipment: Walker - 2 wheels;Cane - quad;Wheelchair - manual;Bedside commode Additional Comments: Pt reports her family works during the day. Reports she is     Prior Function Level of Independence: Needs assistance   Gait / Transfers Assistance Needed: pt reports limited household ambulator with RW. Uses WC when outside of the house. Needs help to get into/out of car  ADL's / Homemaking Assistance Needed: Assist for sponge bathing.         Hand Dominance        Extremity/Trunk Assessment   Upper Extremity Assessment Upper Extremity Assessment: Defer to OT evaluation    Lower Extremity Assessment Lower Extremity Assessment: Generalized weakness;LLE deficits/detail;RLE deficits/detail RLE Deficits / Details: Limited ROM noted in ankle and hips/knees secondary to pain.  LLE Deficits / Details: Limited ROM  noted in ankle and hips/knees secondary to pain.        Communication   Communication: No difficulties  Cognition Arousal/Alertness: Awake/alert Behavior During Therapy: WFL for tasks assessed/performed Overall Cognitive Status: Within Functional Limits for tasks assessed                                        General Comments General comments (skin integrity, edema, etc.): Pt noted to have decreased coordination and tremors in R hand this session. Having  difficulty using telephone and required assist to order her lunch.     Exercises     Assessment/Plan    PT Assessment Patient needs continued PT services  PT Problem List Decreased mobility;Decreased activity tolerance;Decreased skin integrity;Decreased knowledge of use of DME;Impaired sensation;Pain;Decreased range of motion;Decreased balance;Decreased knowledge of precautions       PT Treatment Interventions DME instruction;Therapeutic activities;Gait training;Patient/family education;Therapeutic exercise;Balance training;Functional mobility training    PT Goals (Current goals can be found in the Care Plan section)  Acute Rehab PT Goals Patient Stated Goal: to go to rehab on the 4th floor PT Goal Formulation: With patient Time For Goal Achievement: 09/22/18 Potential to Achieve Goals: Good    Frequency Min 2X/week   Barriers to discharge Decreased caregiver support      Co-evaluation               AM-PAC PT "6 Clicks" Mobility  Outcome Measure Help needed turning from your back to your side while in a flat bed without using bedrails?: Total Help needed moving from lying on your back to sitting on the side of a flat bed without using bedrails?: Total Help needed moving to and from a bed to a chair (including a wheelchair)?: Total Help needed standing up from a chair using your arms (e.g., wheelchair or bedside chair)?: Total Help needed to walk in hospital room?: Total Help needed climbing 3-5 steps with a railing? : Total 6 Click Score: 6    End of Session   Activity Tolerance: Patient limited by pain Patient left: in bed;with call bell/phone within reach;with bed alarm set;with nursing/sitter in room Nurse Communication: Mobility status PT Visit Diagnosis: Difficulty in walking, not elsewhere classified (R26.2);Pain;Muscle weakness (generalized) (M62.81) Pain - Right/Left: Left Pain - part of body: Ankle and joints of foot(R leg)    Time: 1324-4010 PT Time  Calculation (min) (ACUTE ONLY): 20 min   Charges:   PT Evaluation $PT Eval Moderate Complexity: [place], PT, DPT  Acute Rehabilitation Services  Pager: [phone] Office: [phone]   April [name] 09/08/2018, 1:39 PM

## 2018-09-08 NOTE — Plan of Care (Signed)
  Problem: Education: Goal: Knowledge of General Education information will improve Description: Including pain rating scale, medication(s)/side effects and non-pharmacologic comfort measures Outcome: Progressing   Problem: Nutrition: Goal: Adequate nutrition will be maintained Outcome: Progressing   Problem: Safety: Goal: Ability to remain free from injury will improve Outcome: Progressing   

## 2018-09-08 NOTE — Care Management Obs Status (Signed)
[facility] NOTIFICATION   Patient Details  Name: April [name] MRN: [id number] Date of Birth: 07-14-1950   Medicare Observation Status Notification Given:  Yes    Ninfa Meeker, RN 09/08/2018, 11:30 AM

## 2018-09-09 ENCOUNTER — Ambulatory Visit: Payer: Medicare Other | Admitting: Podiatry

## 2018-09-09 DIAGNOSIS — S86012A Strain of left Achilles tendon, initial encounter: Secondary | ICD-10-CM | POA: Diagnosis not present

## 2018-09-09 DIAGNOSIS — G629 Polyneuropathy, unspecified: Secondary | ICD-10-CM

## 2018-09-09 DIAGNOSIS — M109 Gout, unspecified: Secondary | ICD-10-CM | POA: Diagnosis not present

## 2018-09-09 DIAGNOSIS — E1169 Type 2 diabetes mellitus with other specified complication: Secondary | ICD-10-CM | POA: Diagnosis not present

## 2018-09-09 LAB — GLUCOSE, CAPILLARY
Glucose-Capillary: 87 mg/dL (ref 70–99)
Glucose-Capillary: 112 mg/dL — ABNORMAL HIGH (ref 70–99)
Glucose-Capillary: 150 mg/dL — ABNORMAL HIGH (ref 70–99)
Glucose-Capillary: 99 mg/dL (ref 70–99)

## 2018-09-09 MED ORDER — OXYCODONE HCL 5 MG PO TABS
5.0000 mg | ORAL_TABLET | Freq: Four times a day (QID) | ORAL | Status: DC | PRN
  Administered 2018-09-09 – 2018-09-15 (×13): 5 mg via ORAL
  Filled 2018-09-09 (×13): qty 1

## 2018-09-09 MED ORDER — MORPHINE SULFATE (PF) 2 MG/ML IV SOLN
1.0000 mg | Freq: Once | INTRAVENOUS | Status: AC
  Administered 2018-09-09: 1 mg via INTRAVENOUS
  Filled 2018-09-09: qty 1

## 2018-09-09 MED ORDER — MUSCLE RUB 10-15 % EX CREA
TOPICAL_CREAM | Freq: Two times a day (BID) | CUTANEOUS | Status: DC | PRN
  Administered 2018-09-09 (×2): via TOPICAL
  Administered 2018-09-10 – 2018-09-12 (×4): 1 via TOPICAL
  Administered 2018-09-12 – 2018-09-15 (×4): via TOPICAL
  Filled 2018-09-09: qty 85

## 2018-09-09 NOTE — Progress Notes (Signed)
Physical Therapy Treatment Patient Details Name: April [name] MRN: [id number] DOB: 08-Dec-1950 Today's Date: 09/09/2018    History of Present Illness PT is a 68 y/o female admitted secondary to worsening LLE and L foot pain. Pt with RLE pain at baseline. Imaging revealed L calcaneous osteomyelitis. Pt also with likely L achilles strain vs partial tear. PMH includes obesity, CAD, asthma, HTN, and DM.     PT Comments    Pt received in bed, very motivated to participate in therapy. She required mod assist supine to sit and +2 mod assist sit to stand in stedy. Stedy utilized for bed to recliner transfer. Unable to progress ambulation due to BLE pain. Pt tolerated 45-minute therapy session and demonstrates good rehab potential. Pt positioned in recliner with feet elevated at end of session.     Follow Up Recommendations  CIR     Equipment Recommendations  Other (comment)(TBD)    Recommendations for Other Services Rehab consult     Precautions / Restrictions Precautions Precautions: Fall Restrictions LLE Weight Bearing: Weight bearing as tolerated    Mobility  Bed Mobility Overal bed mobility: Needs Assistance Bed Mobility: Supine to Sit;Rolling Rolling: Mod assist   Supine to sit: Mod assist     General bed mobility comments: +rail, cues for sequencing, increased time and effort  Transfers Overall transfer level: Needs assistance   Transfers: Sit to/from Stand Sit to Stand: Mod assist;+2 physical assistance         General transfer comment: increased time and effort to pull to stand in stedy. Stedy utilized for bed to recliner transfer.  Ambulation/Gait             General Gait Details: unable due to BLE pain   Stairs             Wheelchair Mobility    Modified Rankin (Stroke Patients Only)       Balance Overall balance assessment: Needs assistance Sitting-balance support: No upper extremity supported;Feet supported Sitting balance-Leahy  Scale: Good Sitting balance - Comments: Pt performed sitting scoot EOB from foot of bed to [facility] with min/mod assist (PT using bed pad to assist).   Standing balance support: Bilateral upper extremity supported Standing balance-Leahy Scale: Poor Standing balance comment: reliant on UE support in static stand                            Cognition Arousal/Alertness: Awake/alert Behavior During Therapy: WFL for tasks assessed/performed Overall Cognitive Status: Within Functional Limits for tasks assessed                                 General Comments: Very determined to work through pain and progress      Exercises      General Comments        Pertinent Vitals/Pain Pain Assessment: 0-10 Pain Score: 9  Pain Location: BLE during stance Pain Descriptors / Indicators: Grimacing;Guarding;Crying;Moaning Pain Intervention(s): Monitored during session;Repositioned;Patient requesting pain meds-RN notified;Ice applied    Home Living                      Prior Function            PT Goals (current goals can now be found in the care plan section) Acute Rehab PT Goals Patient Stated Goal: to go to rehab on the 4th floor PT Goal Formulation: With  patient Time For Goal Achievement: 09/22/18 Potential to Achieve Goals: Good Progress towards PT goals: Progressing toward goals    Frequency    Min 3X/week      PT Plan Discharge plan needs to be updated;Frequency needs to be updated    Co-evaluation              AM-PAC PT "6 Clicks" Mobility   Outcome Measure  Help needed turning from your back to your side while in a flat bed without using bedrails?: A Lot Help needed moving from lying on your back to sitting on the side of a flat bed without using bedrails?: A Lot Help needed moving to and from a bed to a chair (including a wheelchair)?: A Lot Help needed standing up from a chair using your arms (e.g., wheelchair or bedside chair)?: A  Lot Help needed to walk in hospital room?: Total Help needed climbing 3-5 steps with a railing? : Total 6 Click Score: 10    End of Session Equipment Utilized During Treatment: Gait belt Activity Tolerance: Patient tolerated treatment well Patient left: in chair;with call bell/phone within reach Nurse Communication: Mobility status;Need for lift equipment PT Visit Diagnosis: Difficulty in walking, not elsewhere classified (R26.2);Pain;Muscle weakness (generalized) (M62.81) Pain - Right/Left: Left Pain - part of body: Ankle and joints of foot     Time: 7998-7215 PT Time Calculation (min) (ACUTE ONLY): 46 min  Charges:  $Therapeutic Activity: 38-52 mins                     Lorrin Goodell, PT  Office # [phone] Pager [phone]    Lorriane Shire 09/09/2018, 11:32 AM

## 2018-09-09 NOTE — Progress Notes (Signed)
PROGRESS NOTE    April [name]  PXT:[id number] DOB: 1950-02-08 DOA: 09/07/2018 PCP: [name], MD    Brief Narrative:   April [name]  is a 68 y.o. female, with history of asthma, CAD, type 2 diabetes mellitus, morbid obesity, chronic right lower extremity wound, hypertension who came to hospital after patient developed pain in both feet left more than right.  Pain she describes as sharp pain more in the left heel.  Patient has chronic right lower extremity wound and is being followed by Dr. [name].  She gets wound care nurses provide wound care 3 times a week.  Patient denies fever or chills. Denies nausea vomiting or diarrhea. Denies chest pain or shortness of breath. Patient has diabetes mellitus which is diet controlled as per patient.  In the ED x-ray of the left ankle showed possible osteomyelitis involving left calcaneus.  Patient started on IV antibiotics.  Orthopedics was consulted by ED physician and they recommended patient to be transferred to [place]:   Active Problems:   Morbid (severe) obesity due to excess calories (HCC)   Osteomyelitis of ankle or foot, acute, left (HCC)   Osteomyelitis of ankle or foot, left, acute (HCC)   Achilles tendon sprain, left, initial encounter   Left calcaneus lytic changes Patient transferred from [facility] hospital for concern of possible underlying calcaneal osteomyelitis.  Patient is afebrile without leukocytosis.  X-ray left foot personally reviewed, with some lytic changes in the calcaneus with osseous perinei.  Patient was initially started on broad-spectrum antibiotics with vancomycin and aztreonam.  Patient was evaluated by her primary orthopedist, Dr. [name]; who does not believe that there is any clinical indication of osteomyelitis of the left calcaneus.  Recommends physical therapy for ambulation and weightbearing as tolerated.  Silvadene/Santyl dressing changes and treatment to her right lower  extremity wound.  Unfortunately due to patient's body habitus a fracture boot unable to be fitted, which may have helped her with likely Achilles tear. --Discontinued antibiotics 8/6. --PT/OT consult pending --Dr. [name] recommends outpatient follow-up  Acute gout flare Patient complaining of multiple joint pains to include bilateral knees and left ankle.  Uric acid elevated at 12.5. --Colchicine and allopurinol  Type 2 diabetes mellitus Diet controlled at home.  Hemoglobin A1c 6.0. --Consistent carbohydrate diet --NovoLog insulin sliding scale for coverage if needed  CKD stage III Creatinine over the past year ranging from 1.32 to 2.45.  Creatinine on admission 2.04, appears to be within her baseline. --Cr 2.04-->1.76 --Avoid nephrotoxins, renally dose all medications  Chronic right leg ulceration Patient follows with wound care at home. --Wound care consulted, continue Santyl with dry gauze secured with Ace bandage with daily changes  Essential hypertension Blood pressure well controlled.  Continue carvedilol, spironolactone  COPD Not in acute exacerbation.  Continue montelukast, Dulera, as needed albuterol.  Chronic peripheral neuropathy Continue home gabapentin 100 mg p.o. 3 times daily and Trileptal 300mg  PO BID.  Morbid obesity BMI 60.44.  Discussed with patient that the majority of her chronic medical conditions points back to her underlying severe morbid obesity.  Discussed with her needs aggressive weight loss measures as this complicates all facets of care.  DVT prophylaxis: Lovenox Code Status: Full code Family Communication: None Disposition Plan: Continue observation status, PT recommends CIR versus SNF.  CIR declined.  Patient with issues going to SNF due to current viral pandemic.  Patient discussed with her family today and make decision about SNF for tomorrow.  Otherwise we will plan discharge home with home health services on 09/10/2018.   Consultants:    Orthopedics, Dr. [name]  Procedures:   None  Antimicrobials:   Vancomycin 8/5 - 8/6  Aztreonam 8/5 - 8/6   Subjective: Patient seen and examined at bedside, resting comfortably in bed.  Patient continues a plane of neuropathy pains and bilateral knee pain.  Patient denies headache, no fever/chills/night sweats, no nausea/vomiting/diarrhea, no chest pain, palpitations, no abdominal pain, no paresthesias.  No acute events overnight per nursing staff.  Objective: Vitals:   09/08/18 2029 09/09/18 0330 09/09/18 0845 09/09/18 1003  BP: (!) 114/54 118/64  (!) 150/98  Pulse: 88 88 88 90  Resp: 16 18 18    Temp: 99.1 F (37.3 C) 98.9 F (37.2 C)  99.3 F (37.4 C)  TempSrc: Oral Oral  Oral  SpO2: 93% 98% 97% 92%  Weight:      Height:        Intake/Output Summary (Last 24 hours) at 09/09/2018 1350 Last data filed at 09/09/2018 0500 Gross per 24 hour  Intake 600 ml  Output 2000 ml  Net -1400 ml   Filed Weights   09/07/18 2339 09/08/18 0235  Weight: (!) 159.8 kg (!) 159.8 kg    Examination:  General exam: Appears calm and comfortable, morbidly obese Respiratory system: Clear to auscultation. Respiratory effort normal. Cardiovascular system: S1 & S2 heard, RRR. No JVD, murmurs, rubs, gallops or clicks. No pedal edema. Gastrointestinal system: Abdomen is nondistended, protuberant abdomen, soft and nontender. No organomegaly or masses felt. Normal bowel sounds heard. Central nervous system: Alert and oriented. No focal neurological deficits. Extremities: Symmetric 5 x 5 power. Skin: Right lower extremity ulcerative lesion, see attached picture Psychiatry: Judgement and insight appear normal. Mood & affect appropriate.       Data Reviewed: I have personally reviewed following labs and imaging studies  CBC: Recent Labs  Lab 09/07/18 1530 09/08/18 0020  WBC 4.1 4.0  NEUTROABS 2.1  --   HGB 10.7* 10.3*  HCT 35.6* 33.3*  MCV 96.5 93.5  PLT 159 409*   Basic Metabolic  Panel: Recent Labs  Lab 09/07/18 1530 09/08/18 0020  NA 141  --   K 4.2  --   CL 101  --   CO2 30  --   GLUCOSE 103*  --   BUN 28*  --   CREATININE 2.04* 1.76*  CALCIUM 9.2  --    GFR: Estimated Creatinine Clearance: 46.7 mL/min (A) (by C-G formula based on SCr of 1.76 mg/dL (H)). Liver Function Tests: No results for input(s): AST, ALT, ALKPHOS, BILITOT, PROT, ALBUMIN in the last 168 hours. No results for input(s): LIPASE, AMYLASE in the last 168 hours. No results for input(s): AMMONIA in the last 168 hours. Coagulation Profile: No results for input(s): INR, PROTIME in the last 168 hours. Cardiac Enzymes: No results for input(s): CKTOTAL, CKMB, CKMBINDEX, TROPONINI in the last 168 hours. BNP (last 3 results) No results for input(s): PROBNP in the last 8760 hours. HbA1C: Recent Labs    09/08/18 0020  HGBA1C 6.0*   CBG: Recent Labs  Lab 09/08/18 1116 09/08/18 1618 09/08/18 2203 09/09/18 0732 09/09/18 1213  GLUCAP 106* 129* 85 87 112*   Lipid Profile: No results for input(s): CHOL, HDL, LDLCALC, TRIG, CHOLHDL, LDLDIRECT in the last 72 hours. Thyroid Function Tests: No results for input(s): TSH, T4TOTAL, FREET4, T3FREE, THYROIDAB in the last 72 hours. Anemia Panel: No results for input(s): VITAMINB12, FOLATE, FERRITIN, TIBC, IRON,  RETICCTPCT in the last 72 hours. Sepsis Labs: No results for input(s): PROCALCITON, LATICACIDVEN in the last 168 hours.  Recent Results (from the past 240 hour(s))  SARS Coronavirus 2 [facility] order, Performed in [facility] hospital lab) Nasopharyngeal Nasopharyngeal Swab     Status: None   Collection Time: 09/07/18  5:06 PM   Specimen: Nasopharyngeal Swab  Result Value Ref Range Status   SARS Coronavirus 2 NEGATIVE NEGATIVE Final    Comment: (NOTE) If result is NEGATIVE SARS-CoV-2 target nucleic acids are NOT DETECTED. The SARS-CoV-2 RNA is generally detectable in upper and lower  respiratory specimens during the acute phase of  infection. The lowest  concentration of SARS-CoV-2 viral copies this assay can detect is 250  copies / mL. A negative result does not preclude SARS-CoV-2 infection  and should not be used as the sole basis for treatment or other  patient management decisions.  A negative result may occur with  improper specimen collection / handling, submission of specimen other  than nasopharyngeal swab, presence of viral mutation(s) within the  areas targeted by this assay, and inadequate number of viral copies  (<250 copies / mL). A negative result must be combined with clinical  observations, patient history, and epidemiological information. If result is POSITIVE SARS-CoV-2 target nucleic acids are DETECTED. The SARS-CoV-2 RNA is generally detectable in upper and lower  respiratory specimens dur ing the acute phase of infection.  Positive  results are indicative of active infection with SARS-CoV-2.  Clinical  correlation with patient history and other diagnostic information is  necessary to determine patient infection status.  Positive results do  not rule out bacterial infection or co-infection with other viruses. If result is PRESUMPTIVE POSTIVE SARS-CoV-2 nucleic acids MAY BE PRESENT.   A presumptive positive result was obtained on the submitted specimen  and confirmed on repeat testing.  While 2019 novel coronavirus  (SARS-CoV-2) nucleic acids may be present in the submitted sample  additional confirmatory testing may be necessary for epidemiological  and / or clinical management purposes  to differentiate between  SARS-CoV-2 and other Sarbecovirus currently known to infect humans.  If clinically indicated additional testing with an alternate test  methodology [phone]) is advised. The SARS-CoV-2 RNA is generally  detectable in upper and lower respiratory sp ecimens during the acute  phase of infection. The expected result is Negative. Fact Sheet for Patients:   StrictlyIdeas.no Fact Sheet for Healthcare Providers: BankingDealers.co.za This test is not yet approved or cleared by the [place] FDA and has been authorized for detection and/or diagnosis of SARS-CoV-2 by FDA under an Emergency Use Authorization (EUA).  This EUA will remain in effect (meaning this test can be used) for the duration of the COVID-19 declaration under Section 564(b)(1) of the Act, 21 U.S.C. section 360bbb-3(b)(1), unless the authorization is terminated or revoked sooner. Performed at [facility], [place] [address] [place], [place] [address]          Radiology Studies: Dg Tibia/fibula Right  Result Date: 09/07/2018 CLINICAL DATA:  Pain and lower extremity swelling EXAM: RIGHT TIBIA AND FIBULA - 2 VIEW COMPARISON:  February 15, 2018 FINDINGS: Again noted is diffuse subcutaneous edema seen surrounding the lower extremity. Surgical staples seen. No fracture or dislocation is seen. Right knee osteoarthritis is noted. IMPRESSION: Diffuse subcutaneous edema.  No acute osseous abnormality. Electronically Signed   By: Prudencio Pair M.D.   On: 09/07/2018 16:19   Dg Ankle Complete Left  Result Date: 09/07/2018 CLINICAL DATA:  Chronic bilateral leg wounds. EXAM: LEFT ANKLE COMPLETE - 3+ VIEW COMPARISON:  Left ankle dated 08/20/2018. Left foot radiographs obtained today. FINDINGS: Progressive diffuse soft tissue swelling. Interval ill-defined destruction of the anterior aspect of the calcaneus inferiorly. Mild talotibial spur formation posteriorly. Moderate-sized inferior calcaneal spur. IMPRESSION: 1. Interval bone destruction suspicious for osteomyelitis involving the anterior aspect of the calcaneus. 2. Progressive diffuse soft tissue swelling. Electronically Signed   By: Claudie Revering M.D.   On: 09/07/2018 16:54   Dg Foot Complete Left  Result Date: 09/07/2018 CLINICAL DATA:  Bilateral chronic leg wounds with pain  and difficulty walking. EXAM: LEFT FOOT - COMPLETE 3+ VIEW COMPARISON:  Left ankle radiographs obtained today. Left ankle radiographs dated 08/20/2018. FINDINGS: Again demonstrated is ill-defined bone destruction involving the distal aspect of the calcaneus inferiorly. This was not present on 08/20/2018. Moderate-sized inferior calcaneal spur formation. No soft tissue gas. IMPRESSION: Probable osteomyelitis involving the distal aspect of the calcaneus. Electronically Signed   By: Claudie Revering M.D.   On: 09/07/2018 16:56        Scheduled Meds:  allopurinol  100 mg Oral Daily   aspirin EC  81 mg Oral QHS   carvedilol  25 mg Oral Q breakfast   colchicine  0.6 mg Oral Daily   collagenase   Topical Daily   enoxaparin (LOVENOX) injection  80 mg Subcutaneous Q24H   famotidine  20 mg Oral BID   fluticasone furoate-vilanterol  1 puff Inhalation Daily   folic acid  876 mcg Oral QHS   furosemide  120 mg Oral Q breakfast   furosemide  80 mg Oral q1800   gabapentin  100 mg Oral TID   insulin aspart  0-9 Units Subcutaneous TID WC   montelukast  10 mg Oral QHS   Oxcarbazepine  300 mg Oral BID   simvastatin  40 mg Oral QHS   spironolactone  25 mg Oral Daily   Continuous Infusions:    LOS: 1 day    Time spent: 29 minutes spent on chart review, personally reviewed imaging studies and labs, discussion with nursing staff, consultants, updating family and interview/physical exam; more than 50% of that time was spent in counseling and/or coordination of care.    April [name] J [place] DO [facility] Pager [phone]  If 7PM-7AM, please contact night-coverage [email or web address] Password [email or web address] 09/09/2018, 1:50 PM

## 2018-09-09 NOTE — Progress Notes (Signed)
Subjective:    C/o burning neuropathic pain in both feet. Feels she did better on Lyrica, but started to have side effects when on the higher doses. Does not feel gabapentin is helping at all.  C/o pain over bilateral knees and left achilles area as well.  Would like to go to inpatient rehab due to her decreased mobility.   Objective: Vital signs in last 24 hours: Temp:  [98.9 F (37.2 C)-100 F (37.8 C)] 98.9 F (37.2 C) (08/07 0330) Pulse Rate:  [87-88] 88 (08/07 0330) Resp:  [16-20] 18 (08/07 0330) BP: (111-118)/(52-64) 118/64 (08/07 0330) SpO2:  [93 %-98 %] 98 % (08/07 0330)  Intake/Output from previous day: 08/06 0701 - 08/07 0700 In: 1080 [P.O.:1080] Out: 3200 [Urine:3200] Intake/Output this shift: No intake/output data recorded.  Recent Labs    09/07/18 1530 09/08/18 0020  HGB 10.7* 10.3*   Recent Labs    09/07/18 1530 09/08/18 0020  WBC 4.1 4.0  RBC 3.69* 3.56*  HCT 35.6* 33.3*  PLT 159 134*   Recent Labs    09/07/18 1530 09/08/18 0020  NA 141  --   K 4.2  --   CL 101  --   CO2 30  --   BUN 28*  --   CREATININE 2.04* 1.76*  GLUCOSE 103*  --   CALCIUM 9.2  --    No results for input(s): LABPT, INR in the last 72 hours.  Right anterior lower leg ulcer- S/P grafting 05/27/2018- has had Santyl over the area overnight and less fibrous tissue/exudate noted over the area this am. No signs of cellulitis or infection. Left achilles area and lateral leg tender to palpation. Also tender over left heel, but no signs of infection or cellulitis.  C/o pain over bilateral knees as well.   Assessment/Plan:    Right lower leg ulcer- continue short course of Santyl.  Gout- uric acid 12.5- continue colchicine and allopurinol. Will try Bengay to the knees, holding off on Voltaren gel due to renal insufficiency.  Left Achilles sprain/tear- conservative treatment, may be WBAT.  Follow up in the office once discharged.  Neuropathy- Will defer any changes to  hospitalist  [facility], PA-C 09/09/2018, 8:34 AM  [facility] [phone]

## 2018-09-09 NOTE — Progress Notes (Signed)
Inpatient Rehabilitation Admissions Coordinator  Inpatient rehab consult received. I am familiar with this patient from her previous admission to hospital 08/2018. Her insurance denied her admission to inpt rehab, for she had no medical need for an inpt rehab admission. SNF recommended at the time and pt refused due to [place]. I met with patient at bedside to again discuss that she had ongoing chronic issues but no acute medical need for admission to inpatient hospital rehab. I continue to recommend SNF rehab and pt continues to decline. I contacted Urban Gibson, SW to assist with transition home. We will sign off at this time.  Danne Baxter, RN, MSN Rehab Admissions Coordinator [phone] 09/09/2018 12:04 PM

## 2018-09-09 NOTE — Evaluation (Signed)
Occupational Therapy Evaluation Patient Details Name: April [name] MRN: [id number] DOB: Aug 07, 1950 Today's Date: 09/09/2018    History of Present Illness PT is a 68 y/o female admitted secondary to worsening LLE and L foot pain. Pt with RLE pain at baseline. Imaging revealed L calcaneous osteomyelitis. Pt also with likely L achilles strain vs partial tear. PMH includes obesity, CAD, asthma, HTN, and DM.    Clinical Impression   Pt is assisted for ADL and IADL at baseline. She can ambulate short distances with a walker, uses a w/c outside of the home and transfers to the [facility] during the day while her husband and son work. Pt with limited tolerance of standing today due to significant L LE pain. Requires 2 person assist and use of stedy to transfer. Pt with good potential to return to her baseline with intensive therapy in CIR. Will follow acutely.    Follow Up Recommendations  CIR    Equipment Recommendations  None recommended by OT    Recommendations for Other Services       Precautions / Restrictions Precautions Precautions: Fall Restrictions Weight Bearing Restrictions: Yes LLE Weight Bearing: Weight bearing as tolerated Other Position/Activity Restrictions: WBAT per ortho notes.       Mobility Bed Mobility      General bed mobility comments: pt seated at EOB upon OT's arrival  Transfers Overall transfer level: Needs assistance Equipment used: Rolling walker (2 wheeled) Transfers: Sit to/from Stand Sit to Stand: Mod assist;+2 physical assistance         General transfer comment: increased time and effort to pull to stand in stedy. Stedy utilized for bed to recliner transfer.    Balance Overall balance assessment: Needs assistance Sitting-balance support: No upper extremity supported;Feet supported Sitting balance-Leahy Scale: Good Sitting balance - Comments: Pt performed sitting scoot EOB from foot of bed to [facility] with min/mod assist (PT using bed pad to  assist).   Standing balance support: Bilateral upper extremity supported Standing balance-Leahy Scale: Poor Standing balance comment: reliant on UE support in static stand                           ADL either performed or assessed with clinical judgement   ADL Overall ADL's : Needs assistance/impaired Eating/Feeding: Sitting;Set up   Grooming: Set up;Sitting;Wash/dry face   Upper Body Bathing: Minimal assistance;Sitting   Lower Body Bathing: Total assistance;+2 for physical assistance;Sit to/from stand   Upper Body Dressing : Set up;Sitting   Lower Body Dressing: Total assistance;+2 for physical assistance;Sit to/from stand   Toilet Transfer: +2 for physical assistance;Total assistance   Toileting- Clothing Manipulation and Hygiene: Total assistance;+2 for physical assistance       Functional mobility during ADLs: (unable to ambulate due to pain) General ADL Comments: pt unable to stand with RW from EOB, assisted with bari stedy and use of bed pads under hip to stand and transfer to recliner     Vision Baseline Vision/History: Wears glasses Wears Glasses: At all times Patient Visual Report: No change from baseline       Perception     Praxis      Pertinent Vitals/Pain Pain Assessment: Faces Pain Score: 9  Faces Pain Scale: Hurts whole lot Pain Location: BLE during stance Pain Descriptors / Indicators: Grimacing;Guarding;Crying;Moaning Pain Intervention(s): Monitored during session;Repositioned;Patient requesting pain meds-RN notified     Hand Dominance Right   Extremity/Trunk Assessment Upper Extremity Assessment Upper Extremity Assessment: Overall WFL for  tasks assessed   Lower Extremity Assessment Lower Extremity Assessment: Defer to PT evaluation   Cervical / Trunk Assessment Cervical / Trunk Assessment: Other exceptions Cervical / Trunk Exceptions: morbid obesity   Communication Communication Communication: No difficulties   Cognition  Arousal/Alertness: Awake/alert Behavior During Therapy: WFL for tasks assessed/performed Overall Cognitive Status: Within Functional Limits for tasks assessed                                 General Comments: Very determined to work through pain and progress   General Comments       Exercises     Shoulder Instructions      Home Living Family/patient expects to be discharged to:: Private residence Living Arrangements: Spouse/significant other Available Help at Discharge: Family;Available PRN/intermittently Type of Home: House Home Access: Stairs to enter [employer] of Steps: 2 Entrance Stairs-Rails: Right;Left Home Layout: Two level     Bathroom Shower/Tub: Teacher, early years/pre: Standard     Home Equipment: Environmental consultant - 2 wheels;Cane - quad;Wheelchair - manual;Bedside commode   Additional Comments: family works, can assist in morning and pm, needs to be able to transfer and manage toilet to go home      Prior Functioning/Environment Level of Independence: Needs assistance  Gait / Transfers Assistance Needed: pt reports limited household ambulator with RW. Uses WC when outside of the house. Needs help to get into/out of car ADL's / Homemaking Assistance Needed: assist for sponge bathing, LB dressing and IADL   Comments: pt primarily transfers to from her chair to the [facility] during the day, her family sets her up for everything she needs while they are at work        OT Problem List: Decreased strength;Impaired balance (sitting and/or standing);Decreased activity tolerance;Pain;Decreased knowledge of use of DME or AE;Obesity      OT Treatment/Interventions: Self-care/ADL training;DME and/or AE instruction;Patient/family education;Balance training;Therapeutic activities    OT Goals(Current goals can be found in the care plan section) Acute Rehab OT Goals Patient Stated Goal: to go to rehab on the 4th floor OT Goal Formulation: With  patient Time For Goal Achievement: 09/23/18 Potential to Achieve Goals: Fair ADL Goals Pt Will Transfer to Toilet: with modified independence;stand pivot transfer;bedside commode Pt Will Perform Toileting - Clothing Manipulation and hygiene: with modified independence;sit to/from stand  OT Frequency: Min 2X/week   Barriers to D/C: Decreased caregiver support;Inaccessible home environment          Co-evaluation PT/OT/SLP Co-Evaluation/Treatment: Yes Reason for Co-Treatment: For patient/therapist safety   OT goals addressed during session: ADL's and self-care      AM-PAC OT "6 Clicks" Daily Activity     Outcome Measure Help from another person eating meals?: A Little Help from another person taking care of personal grooming?: A Little Help from another person toileting, which includes using toliet, bedpan, or urinal?: Total Help from another person bathing (including washing, rinsing, drying)?: A Lot Help from another person to put on and taking off regular upper body clothing?: A Little Help from another person to put on and taking off regular lower body clothing?: Total 6 Click Score: 13   End of Session Nurse Communication: Mobility status;Need for lift equipment  Activity Tolerance: Patient limited by pain Patient left: in chair;with call bell/phone within reach  OT Visit Diagnosis: Unsteadiness on feet (R26.81);History of falling (Z91.81);Muscle weakness (generalized) (M62.81);Pain  Time: 2567-2091 OT Time Calculation (min): 19 min Charges:  OT General Charges $OT Visit: 1 Visit OT Evaluation $OT Eval Moderate Complexity: 1 Mod  Nestor Lewandowsky, OTR/L Acute Rehabilitation Services Pager: [phone] Office: [phone]  Malka So 09/09/2018, 11:48 AM

## 2018-09-09 NOTE — TOC Initial Note (Signed)
Transition of Care [facility]) - Initial/Assessment Note    Patient Details  Name: April [name] MRN: [id number] Date of Birth: Jun 23, 1950  [facility]) CM/SW Contact:    Gelene Mink, [place] Phone Number: 09/09/2018, 1:07 PM  Clinical Narrative:            CSW met with the patient at bedside. CSW introduced herself and explained her role. CSW shared the therapy recommendations. The patient was apprehensive about going to SNF due to COVID-19. CSW shared that there were many SNF facilities within [place] that did not have any outbreaks. CSW shared those facilities. The patient shared that she has been to [facility] back in January for surgery recovery. The patient was concerned about being in her co-pay days. CSW stated that she could contact [facility] to see how many days she used. The patient stated that she has used [facility] for home health. The patient requested time to discuss with her family.   CSW stated she would follow up on Saturday regarding the disposition decision. CSW will continue to follow.          Expected Discharge Plan: [facility](HH vs SNF) Barriers to Discharge: Insurance Authorization, Continued Medical Work up   Patient Goals and CMS Choice Patient states their goals for this hospitalization and ongoing recovery are:: No goal stated CMS Medicare.gov Compare Post Acute Care list provided to:: Patient Choice offered to / list presented to : Patient  Expected Discharge Plan and Services Expected Discharge Plan: [place] [facility](HH vs SNF) In-house Referral: Clinical Social Work Discharge Planning Services: NA Post Acute Care Choice: [place] arrangements for the past 2 months: Single Family Home                 DME Arranged: N/A DME Agency: NA       HH Arranged: NA [place] Agency: NA        Prior Living Arrangements/Services Living arrangements for the past 2  months: [place] with:: Self Patient language and need for interpreter reviewed:: No Do you feel safe going back to the place where you live?: Yes      Need for Family Participation in Patient Care: Yes (Comment) Care giver support system in place?: Yes (comment)   Criminal Activity/Legal Involvement Pertinent to Current Situation/Hospitalization: No - Comment as needed  Activities of Daily Living Home Assistive Devices/Equipment: Bedside commode/3-in-1, Walker (specify type) ADL Screening (condition at time of admission) Patient's cognitive ability adequate to safely complete daily activities?: Yes Is the patient deaf or have difficulty hearing?: No Does the patient have difficulty seeing, even when wearing glasses/contacts?: No Does the patient have difficulty concentrating, remembering, or making decisions?: No Patient able to express need for assistance with ADLs?: Yes Does the patient have difficulty dressing or bathing?: Yes Independently performs ADLs?: No Does the patient have difficulty walking or climbing stairs?: Yes Weakness of Legs: Both Weakness of Arms/Hands: None  Permission Sought/Granted Permission sought to share information with : Case Manager Permission granted to share information with : Yes, Verbal Permission Granted              Emotional Assessment Appearance:: Appears stated age Attitude/Demeanor/Rapport: Engaged Affect (typically observed): Accepting Orientation: : Oriented to Self, Oriented to Place, Oriented to  Time, Oriented to Situation Alcohol / Substance Use: Not Applicable Psych Involvement: No (comment)  Admission diagnosis:  Morbid obesity ([place]) [E66.01] Neuropathy [G62.9]  Chronic ulcer of right leg, limited to breakdown of skin ([place]) [L97.911] Ambulatory dysfunction [R26.2] Other acute osteomyelitis of left foot (HCC) [M86.172] Osteomyelitis of ankle or foot, left, acute [facility]) [I78.676] Patient Active Problem List    Diagnosis Date Noted  . Achilles tendon sprain, left, initial encounter   . Osteomyelitis of ankle or foot, acute, left ([place]) 09/07/2018  . Osteomyelitis of ankle or foot, left, acute ([place]) 09/07/2018  . Intractable pain 08/17/2018  . Acute on chronic kidney failure ([place]) 08/15/2018  . Onychomycosis 05/19/2018  . Idiopathic chronic venous hypertension of right lower extremity with ulcer and inflammation ([place]) 05/19/2018  . Traumatic wound dehiscence   . Skin lumps 01/14/2018  . Bilateral leg cramps 12/14/2017  . Dry skin dermatitis 06/29/2017  . Left shoulder pain 08/14/2016  . Meralgia paresthetica 07/16/2016  . Chronic left-sided low back pain with left-sided sciatica 06/02/2016  . Whole body pain 03/20/2016  . Diabetic peripheral neuropathy ([place]) 03/20/2016  . Osteopenia 01/11/2016  . CKD (chronic kidney disease) stage 3, GFR 30-59 ml/min (HCC) 01/02/2016  . Hand paresthesia 07/18/2015  . Carpal tunnel syndrome 06/07/2015  . Family history of colon cancer   . Diabetes mellitus with neurological manifestations ([place]) 04/18/2015  . Bilateral leg edema 04/11/2015  . Abnormality of gait 01/03/2015  . Hereditary and idiopathic peripheral neuropathy 01/03/2015  . GERD (gastroesophageal reflux disease) 06/17/2014  . Hx of colonic polyps 12/15/2012  . Vitamin B12 deficiency 11/03/2012  . Intrinsic asthma 03/23/2012  . Chronic diastolic heart failure ([place]) 02/20/2011  . OSA (obstructive sleep apnea) 09/17/2010  . URINARY URGENCY 01/08/2010  . CAD, NATIVE VESSEL 11/20/2008  . Osteoarthritis of knees, bilateral 06/14/2008  . Hyperlipidemia 05/10/2007  . Essential hypertension 01/18/2007  . HYPOKALEMIA 04/30/2006  . Morbid (severe) obesity due to excess calories ([place]) 04/30/2006  . HX, PERSONAL, PEPTIC ULCER DISEASE 04/30/2006   PCP:  [name], MD Pharmacy:   [facility] [place], [place] - [address] [place] [address] Phone: [phone] Fax: [phone]     Social Determinants of Health (SDOH) Interventions    Readmission Risk Interventions No flowsheet data found.

## 2018-09-10 DIAGNOSIS — N183 Chronic kidney disease, stage 3 (moderate): Secondary | ICD-10-CM | POA: Diagnosis not present

## 2018-09-10 DIAGNOSIS — E782 Mixed hyperlipidemia: Secondary | ICD-10-CM

## 2018-09-10 DIAGNOSIS — R600 Localized edema: Secondary | ICD-10-CM

## 2018-09-10 DIAGNOSIS — S86012A Strain of left Achilles tendon, initial encounter: Secondary | ICD-10-CM | POA: Diagnosis not present

## 2018-09-10 DIAGNOSIS — E1142 Type 2 diabetes mellitus with diabetic polyneuropathy: Secondary | ICD-10-CM

## 2018-09-10 DIAGNOSIS — R5381 Other malaise: Secondary | ICD-10-CM

## 2018-09-10 LAB — GLUCOSE, CAPILLARY
Glucose-Capillary: 89 mg/dL (ref 70–99)
Glucose-Capillary: 118 mg/dL — ABNORMAL HIGH (ref 70–99)
Glucose-Capillary: 121 mg/dL — ABNORMAL HIGH (ref 70–99)
Glucose-Capillary: 90 mg/dL (ref 70–99)

## 2018-09-10 MED ORDER — WHITE PETROLATUM EX OINT
TOPICAL_OINTMENT | CUTANEOUS | Status: AC
  Administered 2018-09-10: 0.2
  Filled 2018-09-10: qty 28.35

## 2018-09-10 NOTE — Plan of Care (Signed)
  Problem: Safety: Goal: Ability to remain free from injury will improve Outcome: Progressing   

## 2018-09-10 NOTE — TOC Progression Note (Signed)
Transition of Care [facility]) - Progression Note    Patient Details  Name: April [name] MRN: [id number] Date of Birth: 06-May-1950  Transition of Care [facility]) CM/SW [place], [place] Phone Number: [phone] 09/10/2018, 10:34 AM  Clinical Narrative:     CSW met with patient bedside to discuss her decision in regards to going to a SNF or going home. Patient informed CSW that she is not ready to make a decision because she spoke to her insurance on Friday for 2 hours in regards to the CIR insurance denial. Patient informed CSW that she wants to go to a CIR and will be calling the insurance company back on Monday. Patient was unwilling to make a decision at this time.    Expected Discharge Plan: [place] [facility](HH vs SNF) Barriers to Discharge: Ship broker, Continued Medical Work up  Expected Discharge Plan and Services Expected Discharge Plan: [place] [facility](HH vs SNF) In-house Referral: Clinical Social Work Discharge Planning Services: NA Post Acute Care Choice: [place] arrangements for the past 2 months: Single Family Home                 DME Arranged: N/A DME Agency: NA       HH Arranged: NA HH Agency: NA         Social Determinants of Health (SDOH) Interventions    Readmission Risk Interventions No flowsheet data found.

## 2018-09-10 NOTE — Progress Notes (Signed)
PROGRESS NOTE    April [name]  GHW:[id number] DOB: 1950-06-01 DOA: 09/07/2018 PCP: [name], MD    Brief Narrative:   April [name]  is a 68 y.o. female, with history of asthma, CAD, type 2 diabetes mellitus, morbid obesity, chronic right lower extremity wound, hypertension who came to hospital after patient developed pain in both feet left more than right.  Pain she describes as sharp pain more in the left heel.  Patient has chronic right lower extremity wound and is being followed by Dr. [name].  She gets wound care nurses provide wound care 3 times a week.  Patient denies fever or chills. Denies nausea vomiting or diarrhea. Denies chest pain or shortness of breath. Patient has diabetes mellitus which is diet controlled as per patient.  In the ED x-ray of the left ankle showed possible osteomyelitis involving left calcaneus.  Patient started on IV antibiotics.  Orthopedics was consulted by ED physician and they recommended patient to be transferred to [place]:   Active Problems:   Hyperlipidemia   Morbid (severe) obesity due to excess calories (HCC)   Essential hypertension   Bilateral leg edema   CKD (chronic kidney disease) stage 3, GFR 30-59 ml/min (HCC)   Diabetic peripheral neuropathy (HCC)   Osteomyelitis of ankle or foot, acute, left (HCC)   Osteomyelitis of ankle or foot, left, acute (HCC)   Achilles tendon sprain, left, initial encounter   Left calcaneus lytic changes Patient transferred from [facility] hospital for concern of possible underlying calcaneal osteomyelitis.  Patient is afebrile without leukocytosis.  X-ray left foot personally reviewed, with some lytic changes in the calcaneus with osseous perinei.  Patient was initially started on broad-spectrum antibiotics with vancomycin and aztreonam.  Patient was evaluated by her primary orthopedist, Dr. [name]; who does not believe that there is any clinical indication of osteomyelitis of  the left calcaneus.  Recommends physical therapy for ambulation and weightbearing as tolerated.  Silvadene/Santyl dressing changes and treatment to her right lower extremity wound.  Unfortunately due to patient's body habitus a fracture boot unable to be fitted, which may have helped her with likely Achilles tear. --Discontinued antibiotics 8/6. --PT/OT recommends CIR admission --CIR evaluation 09/09/2018, states patient was denied by insurance on previous admission.  Patient discussed with her insurance company on 09/09/2018, and states that her insurance will now improve a inpatient rehabilitation admission.  We will have CIR reevaluate patient on Monday when they return. --Dr. [name] recommends outpatient follow-up  Acute gout flare Patient complaining of multiple joint pains to include bilateral knees and left ankle.  Uric acid elevated at 12.5. --Colchicine and allopurinol  Type 2 diabetes mellitus Diet controlled at home.  Hemoglobin A1c 6.0. --Consistent carbohydrate diet --NovoLog insulin sliding scale for coverage if needed  CKD stage III Creatinine over the past year ranging from 1.32 to 2.45.  Creatinine on admission 2.04, appears to be within her baseline. --Cr 2.04-->1.76 --Avoid nephrotoxins, renally dose all medications  Chronic right leg ulceration Patient follows with wound care at home. --Wound care consulted, continue Santyl with dry gauze secured with Ace bandage with daily changes  Essential hypertension Blood pressure well controlled.  Continue carvedilol, spironolactone  COPD Not in acute exacerbation.  Continue montelukast, Dulera, as needed albuterol.  Chronic peripheral neuropathy Continue home gabapentin 100 mg p.o. 3 times daily and Trileptal 300mg  PO BID.  Morbid obesity BMI 60.44.  Discussed with patient that the majority of her chronic medical  conditions points back to her underlying severe morbid obesity.  Discussed with her needs aggressive weight loss  measures as this complicates all facets of care.  DVT prophylaxis: Lovenox Code Status: Full code Family Communication: None Disposition Plan: Continue observation status, PT recommends CIR versus SNF.  CIR declined due to previous insurance decline on last hospitalization.  Patient with issues going to SNF due to current viral pandemic.  Patient discussed this with her insurance company via telephone on 09/09/2018, and states that they will now approve an inpatient rehabilitation stay.  We will have CIR reevaluate on Monday, 09/12/2018.  Otherwise we need to make a decision regarding SNF versus discharge back home with home health services.  Consultants:   Orthopedics, Dr. [name]  Procedures:   None  Antimicrobials:   Vancomycin 8/5 - 8/6  Aztreonam 8/5 - 8/6   Subjective: Patient seen and examined at bedside, resting comfortably in bed.  Patient continues with neuropathy pains and bilateral knee pain.  Patient denies headache, no fever/chills/night sweats, no nausea/vomiting/diarrhea, no chest pain, palpitations, no abdominal pain, no paresthesias.  No acute events overnight per nursing staff.  Objective: Vitals:   09/09/18 1618 09/09/18 1926 09/10/18 0506 09/10/18 0848  BP: 111/63 126/61 137/64   Pulse: 81 85 81 81  Resp:    16  Temp: 99 F (37.2 C) 98.8 F (37.1 C) 98 F (36.7 C)   TempSrc: Oral Oral Oral   SpO2: 95% 95% 91% 92%  Weight:      Height:        Intake/Output Summary (Last 24 hours) at 09/10/2018 1127 Last data filed at 09/09/2018 2300 Gross per 24 hour  Intake 760 ml  Output 2325 ml  Net -1565 ml   Filed Weights   09/07/18 2339 09/08/18 0235  Weight: (!) 159.8 kg (!) 159.8 kg    Examination:  General exam: Appears calm and comfortable, morbidly obese Respiratory system: Clear to auscultation. Respiratory effort normal. Cardiovascular system: S1 & S2 heard, RRR. No JVD, murmurs, rubs, gallops or clicks. No pedal edema. Gastrointestinal system: Abdomen  is nondistended, protuberant abdomen, soft and nontender. No organomegaly or masses felt. Normal bowel sounds heard. Central nervous system: Alert and oriented. No focal neurological deficits. Extremities: Symmetric 5 x 5 power. Skin: Right lower extremity ulcerative lesion, see attached picture Psychiatry: Judgement and insight appear normal. Mood & affect appropriate.       Data Reviewed: I have personally reviewed following labs and imaging studies  CBC: Recent Labs  Lab 09/07/18 1530 09/08/18 0020  WBC 4.1 4.0  NEUTROABS 2.1  --   HGB 10.7* 10.3*  HCT 35.6* 33.3*  MCV 96.5 93.5  PLT 159 098*   Basic Metabolic Panel: Recent Labs  Lab 09/07/18 1530 09/08/18 0020  NA 141  --   K 4.2  --   CL 101  --   CO2 30  --   GLUCOSE 103*  --   BUN 28*  --   CREATININE 2.04* 1.76*  CALCIUM 9.2  --    GFR: Estimated Creatinine Clearance: 46.7 mL/min (A) (by C-G formula based on SCr of 1.76 mg/dL (H)). Liver Function Tests: No results for input(s): AST, ALT, ALKPHOS, BILITOT, PROT, ALBUMIN in the last 168 hours. No results for input(s): LIPASE, AMYLASE in the last 168 hours. No results for input(s): AMMONIA in the last 168 hours. Coagulation Profile: No results for input(s): INR, PROTIME in the last 168 hours. Cardiac Enzymes: No results for input(s): CKTOTAL, CKMB, CKMBINDEX,  TROPONINI in the last 168 hours. BNP (last 3 results) No results for input(s): PROBNP in the last 8760 hours. HbA1C: Recent Labs    09/08/18 0020  HGBA1C 6.0*   CBG: Recent Labs  Lab 09/09/18 1213 09/09/18 1613 09/09/18 2140 09/10/18 0748 09/10/18 1121  GLUCAP 112* 150* 99 89 118*   Lipid Profile: No results for input(s): CHOL, HDL, LDLCALC, TRIG, CHOLHDL, LDLDIRECT in the last 72 hours. Thyroid Function Tests: No results for input(s): TSH, T4TOTAL, FREET4, T3FREE, THYROIDAB in the last 72 hours. Anemia Panel: No results for input(s): VITAMINB12, FOLATE, FERRITIN, TIBC, IRON, RETICCTPCT  in the last 72 hours. Sepsis Labs: No results for input(s): PROCALCITON, LATICACIDVEN in the last 168 hours.  Recent Results (from the past 240 hour(s))  SARS Coronavirus 2 [facility] order, Performed in [facility] hospital lab) Nasopharyngeal Nasopharyngeal Swab     Status: None   Collection Time: 09/07/18  5:06 PM   Specimen: Nasopharyngeal Swab  Result Value Ref Range Status   SARS Coronavirus 2 NEGATIVE NEGATIVE Final    Comment: (NOTE) If result is NEGATIVE SARS-CoV-2 target nucleic acids are NOT DETECTED. The SARS-CoV-2 RNA is generally detectable in upper and lower  respiratory specimens during the acute phase of infection. The lowest  concentration of SARS-CoV-2 viral copies this assay can detect is 250  copies / mL. A negative result does not preclude SARS-CoV-2 infection  and should not be used as the sole basis for treatment or other  patient management decisions.  A negative result may occur with  improper specimen collection / handling, submission of specimen other  than nasopharyngeal swab, presence of viral mutation(s) within the  areas targeted by this assay, and inadequate number of viral copies  (<250 copies / mL). A negative result must be combined with clinical  observations, patient history, and epidemiological information. If result is POSITIVE SARS-CoV-2 target nucleic acids are DETECTED. The SARS-CoV-2 RNA is generally detectable in upper and lower  respiratory specimens dur ing the acute phase of infection.  Positive  results are indicative of active infection with SARS-CoV-2.  Clinical  correlation with patient history and other diagnostic information is  necessary to determine patient infection status.  Positive results do  not rule out bacterial infection or co-infection with other viruses. If result is PRESUMPTIVE POSTIVE SARS-CoV-2 nucleic acids MAY BE PRESENT.   A presumptive positive result was obtained on the submitted specimen  and confirmed on  repeat testing.  While 2019 novel coronavirus  (SARS-CoV-2) nucleic acids may be present in the submitted sample  additional confirmatory testing may be necessary for epidemiological  and / or clinical management purposes  to differentiate between  SARS-CoV-2 and other Sarbecovirus currently known to infect humans.  If clinically indicated additional testing with an alternate test  methodology [phone]) is advised. The SARS-CoV-2 RNA is generally  detectable in upper and lower respiratory sp ecimens during the acute  phase of infection. The expected result is Negative. Fact Sheet for Patients:  StrictlyIdeas.no Fact Sheet for Healthcare Providers: BankingDealers.co.za This test is not yet approved or cleared by the [place] FDA and has been authorized for detection and/or diagnosis of SARS-CoV-2 by FDA under an Emergency Use Authorization (EUA).  This EUA will remain in effect (meaning this test can be used) for the duration of the COVID-19 declaration under Section 564(b)(1) of the Act, 21 U.S.C. section 360bbb-3(b)(1), unless the authorization is terminated or revoked sooner. Performed at [facility], [address] [place],  [place] [address]          Radiology Studies: No results found.      Scheduled Meds: . allopurinol  100 mg Oral Daily  . aspirin EC  81 mg Oral QHS  . carvedilol  25 mg Oral Q breakfast  . colchicine  0.6 mg Oral Daily  . collagenase   Topical Daily  . enoxaparin (LOVENOX) injection  80 mg Subcutaneous Q24H  . famotidine  20 mg Oral BID  . fluticasone furoate-vilanterol  1 puff Inhalation Daily  . folic acid  170 mcg Oral QHS  . furosemide  120 mg Oral Q breakfast  . furosemide  80 mg Oral q1800  . gabapentin  100 mg Oral TID  . insulin aspart  0-9 Units Subcutaneous TID WC  . montelukast  10 mg Oral QHS  . Oxcarbazepine  300 mg Oral BID  . simvastatin  40 mg Oral QHS   . spironolactone  25 mg Oral Daily   Continuous Infusions:    LOS: 1 day    Time spent: 29 minutes spent on chart review, personally reviewed imaging studies and labs, discussion with nursing staff, consultants, updating family and interview/physical exam; more than 50% of that time was spent in counseling and/or coordination of care.    Eric J [place] DO [facility] Pager [phone]  If 7PM-7AM, please contact night-coverage [email or web address] Password [facility] 09/10/2018, 11:27 AM

## 2018-09-11 DIAGNOSIS — E1142 Type 2 diabetes mellitus with diabetic polyneuropathy: Secondary | ICD-10-CM | POA: Diagnosis not present

## 2018-09-11 DIAGNOSIS — N183 Chronic kidney disease, stage 3 (moderate): Secondary | ICD-10-CM | POA: Diagnosis not present

## 2018-09-11 DIAGNOSIS — S86012A Strain of left Achilles tendon, initial encounter: Secondary | ICD-10-CM | POA: Diagnosis not present

## 2018-09-11 DIAGNOSIS — R600 Localized edema: Secondary | ICD-10-CM | POA: Diagnosis not present

## 2018-09-11 LAB — BASIC METABOLIC PANEL
Anion gap: 11 (ref 5–15)
BUN: 22 mg/dL (ref 8–23)
CO2: 31 mmol/L (ref 22–32)
Calcium: 8.4 mg/dL — ABNORMAL LOW (ref 8.9–10.3)
Chloride: 96 mmol/L — ABNORMAL LOW (ref 98–111)
Creatinine, Ser: 1.5 mg/dL — ABNORMAL HIGH (ref 0.44–1.00)
GFR calc Af Amer: 41 mL/min — ABNORMAL LOW (ref >60)
GFR calc non Af Amer: 35 mL/min — ABNORMAL LOW (ref >60)
Glucose, Bld: 99 mg/dL (ref 70–99)
Potassium: 3.4 mmol/L — ABNORMAL LOW (ref 3.5–5.1)
Sodium: 138 mmol/L (ref 135–145)

## 2018-09-11 LAB — GLUCOSE, CAPILLARY
Glucose-Capillary: 95 mg/dL (ref 70–99)
Glucose-Capillary: 145 mg/dL — ABNORMAL HIGH (ref 70–99)
Glucose-Capillary: 223 mg/dL — ABNORMAL HIGH (ref 70–99)
Glucose-Capillary: 204 mg/dL — ABNORMAL HIGH (ref 70–99)

## 2018-09-11 LAB — URIC ACID: Uric Acid, Serum: 11 mg/dL — ABNORMAL HIGH (ref 2.5–7.1)

## 2018-09-11 MED ORDER — POTASSIUM CHLORIDE CRYS ER 20 MEQ PO TBCR
40.0000 meq | EXTENDED_RELEASE_TABLET | Freq: Once | ORAL | Status: AC
  Administered 2018-09-11: 40 meq via ORAL
  Filled 2018-09-11: qty 2

## 2018-09-11 MED ORDER — PREDNISONE 20 MG PO TABS
40.0000 mg | ORAL_TABLET | Freq: Every day | ORAL | Status: DC
  Administered 2018-09-11 – 2018-09-15 (×5): 40 mg via ORAL
  Filled 2018-09-11 (×5): qty 2

## 2018-09-11 NOTE — Plan of Care (Signed)
  Problem: Education: Goal: Knowledge of General Education information will improve Description: Including pain rating scale, medication(s)/side effects and non-pharmacologic comfort measures Outcome: Progressing   Problem: Clinical Measurements: Goal: Diagnostic test results will improve Outcome: Progressing   Problem: Activity: Goal: Risk for activity intolerance will decrease Outcome: Progressing   Problem: Nutrition: Goal: Adequate nutrition will be maintained Outcome: Progressing   Problem: Elimination: Goal: Will not experience complications related to urinary retention Outcome: Progressing   Problem: Pain Managment: Goal: General experience of comfort will improve Outcome: Progressing   Problem: Safety: Goal: Ability to remain free from injury will improve Outcome: Progressing   Problem: Skin Integrity: Goal: Risk for impaired skin integrity will decrease Outcome: Progressing

## 2018-09-11 NOTE — Plan of Care (Signed)
Plan of care 

## 2018-09-11 NOTE — Progress Notes (Signed)
PROGRESS NOTE    [name]  YSA:[id number] DOB: [birth year] DOA: 09/07/2018 PCP: [name], MD    Brief Narrative:   April [name]  is a 68 y.o. female, with history of asthma, CAD, type 2 diabetes mellitus, morbid obesity, chronic right lower extremity wound, hypertension who came to hospital after patient developed pain in both feet left more than right.  Pain she describes as sharp pain more in the left heel.  Patient has chronic right lower extremity wound and is being followed by Dr. [name].  She gets wound care nurses provide wound care 3 times a week.  Patient denies fever or chills. Denies nausea vomiting or diarrhea. Denies chest pain or shortness of breath. Patient has diabetes mellitus which is diet controlled as per patient.  In the ED x-ray of the left ankle showed possible osteomyelitis involving left calcaneus.  Patient started on IV antibiotics.  Orthopedics was consulted by ED physician and they recommended patient to be transferred to [facility]:   Active Problems:   Hyperlipidemia   Morbid (severe) obesity due to excess calories (HCC)   Essential hypertension   Bilateral leg edema   CKD (chronic kidney disease) stage 3, GFR 30-59 ml/min (HCC)   Diabetic peripheral neuropathy (HCC)   Osteomyelitis of ankle or foot, acute, left (HCC)   Osteomyelitis of ankle or foot, left, acute (HCC)   Achilles tendon sprain, left, initial encounter   Left calcaneus lytic changes Patient transferred from [facility] for concern of possible underlying calcaneal osteomyelitis.  Patient is afebrile without leukocytosis.  X-ray left foot personally reviewed, with some lytic changes in the calcaneus with osseous perinei.  Patient was initially started on broad-spectrum antibiotics with vancomycin and aztreonam.  Patient was evaluated by her primary orthopedist, Dr. [name]; who does not believe that there is any clinical indication of osteomyelitis of  the left calcaneus.  Recommends physical therapy for ambulation and weightbearing as tolerated.  Silvadene/Santyl dressing changes and treatment to her right lower extremity wound.  Unfortunately due to patient's body habitus a fracture boot unable to be fitted, which may have helped her with likely Achilles tear. --Discontinued antibiotics 8/6. --PT/OT recommends CIR admission --CIR evaluation 09/09/2018, states patient was denied by insurance on previous admission.  Patient discussed with her insurance company on 09/09/2018, and states that her insurance will now improve a inpatient rehabilitation admission.  We will have CIR reevaluate patient on Monday when they return. --Dr. [name] recommends outpatient follow-up  Acute gout flare Patient complaining of multiple joint pains to include bilateral knees and left ankle.   Uric acid 12.5-->11.0. --Colchicine and allopurinol --start prednisone 40mg  PO daily 8/9; will start slow taper once symptoms improve  Type 2 diabetes mellitus Diet controlled at home.  Hemoglobin A1c 6.0. --Consistent carbohydrate diet --NovoLog insulin sliding scale for coverage if needed  CKD stage III Creatinine over the past year ranging from 1.32 to 2.45.  Creatinine on admission 2.04, appears to be within her baseline. --Cr 2.04-->1.76-->1.50 --Avoid nephrotoxins, renally dose all medications  Chronic right leg ulceration Patient follows with wound care at home. --Wound care consulted, continue Santyl with dry gauze secured with Ace bandage with daily changes  Essential hypertension Blood pressure well controlled.  Continue carvedilol, spironolactone  COPD Not in acute exacerbation.  Continue montelukast, Dulera, as needed albuterol.  Chronic peripheral neuropathy Continue home gabapentin 100 mg p.o. 3 times daily and Trileptal 300mg  PO BID.  Morbid obesity BMI  60.44.  Discussed with patient that the majority of her chronic medical conditions points back to her  underlying severe morbid obesity.  Discussed with her needs aggressive weight loss measures as this complicates all facets of care.  DVT prophylaxis: Lovenox Code Status: Full code Family Communication: None Disposition Plan: Continue observation status, PT recommends CIR versus SNF.  CIR declined due to previous insurance decline on last hospitalization.  Patient with issues going to SNF due to current viral pandemic.  Patient discussed this with her insurance company via telephone on 09/09/2018, and states that they will now approve an inpatient rehabilitation stay.  We will have CIR reevaluate on Monday, 09/12/2018.  Otherwise we need to make a decision regarding SNF versus discharge back home with home health services.  Consultants:   Orthopedics, Dr. [name]  Procedures:   None  Antimicrobials:   Vancomycin 8/5 - 8/6  Aztreonam 8/5 - 8/6   Subjective: Patient seen and examined at bedside, resting comfortably in bed.  Patient continues with neuropathy pains and bilateral knee pain.  Patient denies headache, no fever/chills/night sweats, no nausea/vomiting/diarrhea, no chest pain, palpitations, no abdominal pain, no paresthesias.  No acute events overnight per nursing staff.  Objective: Vitals:   09/10/18 1635 09/10/18 1927 09/11/18 0514 09/11/18 0758  BP: 125/66 118/63 (!) 135/59   Pulse: 71 69 77   Resp:      Temp: 98.2 F (36.8 C) 98.5 F (36.9 C) 98.8 F (37.1 C)   TempSrc: Oral Oral Oral   SpO2: 92% 93% 93% 92%  Weight:      Height:        Intake/Output Summary (Last 24 hours) at 09/11/2018 1146 Last data filed at 09/11/2018 0859 Gross per 24 hour  Intake 760 ml  Output 2130 ml  Net -1370 ml   Filed Weights   09/07/18 2339 09/08/18 0235  Weight: (!) 159.8 kg (!) 159.8 kg    Examination:  General exam: Appears calm and comfortable, morbidly obese Respiratory system: Clear to auscultation. Respiratory effort normal. Cardiovascular system: S1 & S2 heard, RRR. No  JVD, murmurs, rubs, gallops or clicks. No pedal edema. Gastrointestinal system: Abdomen is nondistended, protuberant abdomen, soft and nontender. No organomegaly or masses felt. Normal bowel sounds heard. Central nervous system: Alert and oriented. No focal neurological deficits. Extremities: Symmetric 5 x 5 power. Skin: Right lower extremity ulcerative lesion, see attached picture Psychiatry: Judgement and insight appear normal. Mood & affect appropriate.       Data Reviewed: I have personally reviewed following labs and imaging studies  CBC: Recent Labs  Lab 09/07/18 1530 09/08/18 0020  WBC 4.1 4.0  NEUTROABS 2.1  --   HGB 10.7* 10.3*  HCT 35.6* 33.3*  MCV 96.5 93.5  PLT 159 277*   Basic Metabolic Panel: Recent Labs  Lab 09/07/18 1530 09/08/18 0020 09/11/18 0649  NA 141  --  138  K 4.2  --  3.4*  CL 101  --  96*  CO2 30  --  31  GLUCOSE 103*  --  99  BUN 28*  --  22  CREATININE 2.04* 1.76* 1.50*  CALCIUM 9.2  --  8.4*   GFR: Estimated Creatinine Clearance: 54.8 mL/min (A) (by C-G formula based on SCr of 1.5 mg/dL (H)). Liver Function Tests: No results for input(s): AST, ALT, ALKPHOS, BILITOT, PROT, ALBUMIN in the last 168 hours. No results for input(s): LIPASE, AMYLASE in the last 168 hours. No results for input(s): AMMONIA in the last 168  hours. Coagulation Profile: No results for input(s): INR, PROTIME in the last 168 hours. Cardiac Enzymes: No results for input(s): CKTOTAL, CKMB, CKMBINDEX, TROPONINI in the last 168 hours. BNP (last 3 results) No results for input(s): PROBNP in the last 8760 hours. HbA1C: No results for input(s): HGBA1C in the last 72 hours. CBG: Recent Labs  Lab 09/10/18 1121 09/10/18 1625 09/10/18 2110 09/11/18 0751 09/11/18 1132  GLUCAP 118* 121* 90 95 145*   Lipid Profile: No results for input(s): CHOL, HDL, LDLCALC, TRIG, CHOLHDL, LDLDIRECT in the last 72 hours. Thyroid Function Tests: No results for input(s): TSH, T4TOTAL,  FREET4, T3FREE, THYROIDAB in the last 72 hours. Anemia Panel: No results for input(s): VITAMINB12, FOLATE, FERRITIN, TIBC, IRON, RETICCTPCT in the last 72 hours. Sepsis Labs: No results for input(s): PROCALCITON, LATICACIDVEN in the last 168 hours.  Recent Results (from the past 240 hour(s))  SARS Coronavirus 2 [facility] order, Performed in [facility] hospital lab) Nasopharyngeal Nasopharyngeal Swab     Status: None   Collection Time: 09/07/18  5:06 PM   Specimen: Nasopharyngeal Swab  Result Value Ref Range Status   SARS Coronavirus 2 NEGATIVE NEGATIVE Final    Comment: (NOTE) If result is NEGATIVE SARS-CoV-2 target nucleic acids are NOT DETECTED. The SARS-CoV-2 RNA is generally detectable in upper and lower  respiratory specimens during the acute phase of infection. The lowest  concentration of SARS-CoV-2 viral copies this assay can detect is 250  copies / mL. A negative result does not preclude SARS-CoV-2 infection  and should not be used as the sole basis for treatment or other  patient management decisions.  A negative result may occur with  improper specimen collection / handling, submission of specimen other  than nasopharyngeal swab, presence of viral mutation(s) within the  areas targeted by this assay, and inadequate number of viral copies  (<250 copies / mL). A negative result must be combined with clinical  observations, patient history, and epidemiological information. If result is POSITIVE SARS-CoV-2 target nucleic acids are DETECTED. The SARS-CoV-2 RNA is generally detectable in upper and lower  respiratory specimens dur ing the acute phase of infection.  Positive  results are indicative of active infection with SARS-CoV-2.  Clinical  correlation with patient history and other diagnostic information is  necessary to determine patient infection status.  Positive results do  not rule out bacterial infection or co-infection with other viruses. If result is PRESUMPTIVE  POSTIVE SARS-CoV-2 nucleic acids MAY BE PRESENT.   A presumptive positive result was obtained on the submitted specimen  and confirmed on repeat testing.  While 2019 novel coronavirus  (SARS-CoV-2) nucleic acids may be present in the submitted sample  additional confirmatory testing may be necessary for epidemiological  and / or clinical management purposes  to differentiate between  SARS-CoV-2 and other Sarbecovirus currently known to infect humans.  If clinically indicated additional testing with an alternate test  methodology [id number]) is advised. The SARS-CoV-2 RNA is generally  detectable in upper and lower respiratory sp ecimens during the acute  phase of infection. The expected result is Negative. Fact Sheet for Patients:  StrictlyIdeas.no Fact Sheet for Healthcare Providers: BankingDealers.co.za This test is not yet approved or cleared by the [place] FDA and has been authorized for detection and/or diagnosis of SARS-CoV-2 by FDA under an Emergency Use Authorization (EUA).  This EUA will remain in effect (meaning this test can be used) for the duration of the COVID-19 declaration under Section 564(b)(1) of the Act, 21 U.S.C.  section 360bbb-3(b)(1), unless the authorization is terminated or revoked sooner. Performed at [facility], [place] [address] [place],  [address]          Radiology Studies: No results found.      Scheduled Meds: . allopurinol  100 mg Oral Daily  . aspirin EC  81 mg Oral QHS  . carvedilol  25 mg Oral Q breakfast  . colchicine  0.6 mg Oral Daily  . collagenase   Topical Daily  . enoxaparin (LOVENOX) injection  80 mg Subcutaneous Q24H  . famotidine  20 mg Oral BID  . fluticasone furoate-vilanterol  1 puff Inhalation Daily  . folic acid  791 mcg Oral QHS  . furosemide  120 mg Oral Q breakfast  . furosemide  80 mg Oral q1800  . gabapentin  100 mg Oral TID  .  insulin aspart  0-9 Units Subcutaneous TID WC  . montelukast  10 mg Oral QHS  . Oxcarbazepine  300 mg Oral BID  . predniSONE  40 mg Oral Q breakfast  . simvastatin  40 mg Oral QHS  . spironolactone  25 mg Oral Daily   Continuous Infusions:    LOS: 1 day    Time spent: 29 minutes spent on chart review, personally reviewed imaging studies and labs, discussion with nursing staff, consultants, updating family and interview/physical exam; more than 50% of that time was spent in counseling and/or coordination of care.    Nakyra Bourn J [place] DO [facility] Pager [phone]  If 7PM-7AM, please contact night-coverage [email or web address] Password [email or web address] 09/11/2018, 11:46 AM

## 2018-09-11 NOTE — Plan of Care (Signed)
  Problem: Education: Goal: Knowledge of General Education information will improve Description: Including pain rating scale, medication(s)/side effects and non-pharmacologic comfort measures Outcome: Progressing   Problem: Pain Managment: Goal: General experience of comfort will improve Outcome: Progressing   Problem: Safety: Goal: Ability to remain free from injury will improve Outcome: Progressing   

## 2018-09-12 DIAGNOSIS — E46 Unspecified protein-calorie malnutrition: Secondary | ICD-10-CM | POA: Diagnosis present

## 2018-09-12 DIAGNOSIS — E1122 Type 2 diabetes mellitus with diabetic chronic kidney disease: Secondary | ICD-10-CM | POA: Diagnosis present

## 2018-09-12 DIAGNOSIS — Z6841 Body Mass Index (BMI) 40.0 and over, adult: Secondary | ICD-10-CM | POA: Diagnosis not present

## 2018-09-12 DIAGNOSIS — Z20828 Contact with and (suspected) exposure to other viral communicable diseases: Secondary | ICD-10-CM | POA: Diagnosis present

## 2018-09-12 DIAGNOSIS — E11622 Type 2 diabetes mellitus with other skin ulcer: Secondary | ICD-10-CM | POA: Diagnosis present

## 2018-09-12 DIAGNOSIS — M86172 Other acute osteomyelitis, left ankle and foot: Secondary | ICD-10-CM | POA: Diagnosis not present

## 2018-09-12 DIAGNOSIS — L97919 Non-pressure chronic ulcer of unspecified part of right lower leg with unspecified severity: Secondary | ICD-10-CM | POA: Diagnosis present

## 2018-09-12 DIAGNOSIS — E785 Hyperlipidemia, unspecified: Secondary | ICD-10-CM | POA: Diagnosis present

## 2018-09-12 DIAGNOSIS — K219 Gastro-esophageal reflux disease without esophagitis: Secondary | ICD-10-CM | POA: Diagnosis present

## 2018-09-12 DIAGNOSIS — I509 Heart failure, unspecified: Secondary | ICD-10-CM | POA: Diagnosis present

## 2018-09-12 DIAGNOSIS — I2510 Atherosclerotic heart disease of native coronary artery without angina pectoris: Secondary | ICD-10-CM | POA: Diagnosis present

## 2018-09-12 DIAGNOSIS — Z79899 Other long term (current) drug therapy: Secondary | ICD-10-CM | POA: Diagnosis not present

## 2018-09-12 DIAGNOSIS — L98499 Non-pressure chronic ulcer of skin of other sites with unspecified severity: Secondary | ICD-10-CM | POA: Diagnosis present

## 2018-09-12 DIAGNOSIS — Z7982 Long term (current) use of aspirin: Secondary | ICD-10-CM | POA: Diagnosis not present

## 2018-09-12 DIAGNOSIS — Z87442 Personal history of urinary calculi: Secondary | ICD-10-CM | POA: Diagnosis not present

## 2018-09-12 DIAGNOSIS — R600 Localized edema: Secondary | ICD-10-CM | POA: Diagnosis not present

## 2018-09-12 DIAGNOSIS — Z7951 Long term (current) use of inhaled steroids: Secondary | ICD-10-CM | POA: Diagnosis not present

## 2018-09-12 DIAGNOSIS — N179 Acute kidney failure, unspecified: Secondary | ICD-10-CM | POA: Diagnosis present

## 2018-09-12 DIAGNOSIS — N183 Chronic kidney disease, stage 3 (moderate): Secondary | ICD-10-CM | POA: Diagnosis present

## 2018-09-12 DIAGNOSIS — J449 Chronic obstructive pulmonary disease, unspecified: Secondary | ICD-10-CM | POA: Diagnosis present

## 2018-09-12 DIAGNOSIS — S86012A Strain of left Achilles tendon, initial encounter: Secondary | ICD-10-CM | POA: Diagnosis present

## 2018-09-12 DIAGNOSIS — X58XXXA Exposure to other specified factors, initial encounter: Secondary | ICD-10-CM | POA: Diagnosis present

## 2018-09-12 DIAGNOSIS — G4733 Obstructive sleep apnea (adult) (pediatric): Secondary | ICD-10-CM | POA: Diagnosis present

## 2018-09-12 DIAGNOSIS — I130 Hypertensive heart and chronic kidney disease with heart failure and stage 1 through stage 4 chronic kidney disease, or unspecified chronic kidney disease: Secondary | ICD-10-CM | POA: Diagnosis present

## 2018-09-12 DIAGNOSIS — M109 Gout, unspecified: Secondary | ICD-10-CM | POA: Diagnosis present

## 2018-09-12 DIAGNOSIS — E1142 Type 2 diabetes mellitus with diabetic polyneuropathy: Secondary | ICD-10-CM | POA: Diagnosis present

## 2018-09-12 LAB — GLUCOSE, CAPILLARY
Glucose-Capillary: 113 mg/dL — ABNORMAL HIGH (ref 70–99)
Glucose-Capillary: 127 mg/dL — ABNORMAL HIGH (ref 70–99)
Glucose-Capillary: 221 mg/dL — ABNORMAL HIGH (ref 70–99)
Glucose-Capillary: 167 mg/dL — ABNORMAL HIGH (ref 70–99)

## 2018-09-12 LAB — BASIC METABOLIC PANEL
Anion gap: 10 (ref 5–15)
BUN: 24 mg/dL — ABNORMAL HIGH (ref 8–23)
CO2: 31 mmol/L (ref 22–32)
Calcium: 8.8 mg/dL — ABNORMAL LOW (ref 8.9–10.3)
Chloride: 97 mmol/L — ABNORMAL LOW (ref 98–111)
Creatinine, Ser: 1.57 mg/dL — ABNORMAL HIGH (ref 0.44–1.00)
GFR calc Af Amer: 39 mL/min — ABNORMAL LOW (ref >60)
GFR calc non Af Amer: 34 mL/min — ABNORMAL LOW (ref >60)
Glucose, Bld: 124 mg/dL — ABNORMAL HIGH (ref 70–99)
Potassium: 3.8 mmol/L (ref 3.5–5.1)
Sodium: 138 mmol/L (ref 135–145)

## 2018-09-12 LAB — MAGNESIUM: Magnesium: 2.1 mg/dL (ref 1.7–2.4)

## 2018-09-12 NOTE — Progress Notes (Signed)
Physical Therapy Treatment Patient Details Name: April [name] MRN: [id number] DOB: 04/01/1950 Today's Date: 09/12/2018    History of Present Illness PT is a 68 y/o female admitted secondary to worsening LLE and L foot pain. Pt with RLE pain at baseline. Imaging revealed L calcaneous osteomyelitis. Pt also with likely L achilles strain vs partial tear. PMH includes obesity, CAD, asthma, HTN, and DM.     PT Comments    Pt progressing with therapy and very motivated, was able to stand and pivot with use of RW today instead of stedy. Is hovering right on edge of having sufficient strength to stand from bed and chair, expecially evident with inability to control her momentum when sitting. Min A +2 for mobility today and performance of pregait activities in standing with RW. Pt experiences significant knee pain when [place], faces 6/10. Continuing to recommend CIR at this time if insurance approves, if not, do not feel that pt is safe to be home currently and would recommend SNF. PT will continue to follow.    Follow Up Recommendations  CIR     Equipment Recommendations  Other (comment)(bariatric rollator)    Recommendations for Other Services Rehab consult     Precautions / Restrictions Precautions Precautions: Fall Precaution Comments: pt denies recent falls Restrictions Weight Bearing Restrictions: No LLE Weight Bearing: Weight bearing as tolerated Other Position/Activity Restrictions: WBAT per ortho notes.     Mobility  Bed Mobility Overal bed mobility: Needs Assistance Bed Mobility: Supine to Sit     Supine to sit: Min assist     General bed mobility comments: pt came to sitting from [facility] elevated position, increased time needed, min A for support and mvmt of LE's  Transfers Overall transfer level: Needs assistance Equipment used: Rolling walker (2 wheeled) Transfers: Sit to/from [facility] Sit to Stand: +2 physical assistance;Min assist Stand pivot  transfers: Min assist;+2 physical assistance       General transfer comment: pt unable to push on bed and achieve standing, hands on RW and RW stabilized. Increased time and effort and min A +2 for support. Pt took pivot steps to recliner with RW and min A +2. Pt lacks sufficient knee control for controlled sitting to chair. Practiced standing from recliner with arm rests and pt had more pain in knees but was able to push from arm rests to achieve standing.   Ambulation/Gait Ambulation/Gait assistance: Min assist;+2 physical assistance   Assistive device: Rolling walker (2 wheeled)       General Gait Details: marching in place 10x for pregait   Stairs             Wheelchair Mobility    Modified Rankin (Stroke Patients Only)       Balance Overall balance assessment: Needs assistance Sitting-balance support: No upper extremity supported;Feet supported Sitting balance-Leahy Scale: Good Sitting balance - Comments: pt able to wt shift and scoot without LOB   Standing balance support: Bilateral upper extremity supported Standing balance-Leahy Scale: Poor Standing balance comment: reliant on UE support in static stand and with decreased ability to correct for imbalance with ankle, hip, or stepping strategy, high risk for continued falls                            Cognition Arousal/Alertness: Awake/alert Behavior During Therapy: WFL for tasks assessed/performed Overall Cognitive Status: Within Functional Limits for tasks assessed  General Comments: Very determined to work through pain and progress      Exercises General Exercises - Lower Extremity Ankle Circles/Pumps: AAROM;Both;10 reps;Seated Long Arc Quad: AROM;Both;10 reps;Seated Hip Flexion/Marching: AROM;Both;10 reps;Seated    General Comments General comments (skin integrity, edema, etc.): no tremors noted today.       Pertinent Vitals/Pain Pain  Assessment: Faces Faces Pain Scale: Hurts even more Pain Location: BLE during stance, specifically knees Pain Descriptors / Indicators: Grimacing;Guarding;Aching Pain Intervention(s): Limited activity within patient's tolerance;Monitored during session    Home Living                      Prior Function            PT Goals (current goals can now be found in the care plan section) Acute Rehab PT Goals Patient Stated Goal: to go to rehab on the 4th floor PT Goal Formulation: With patient Time For Goal Achievement: 09/22/18 Potential to Achieve Goals: Good Progress towards PT goals: Progressing toward goals    Frequency    Min 3X/week      PT Plan Current plan remains appropriate    Co-evaluation PT/OT/SLP Co-Evaluation/Treatment: Yes Reason for Co-Treatment: For patient/therapist safety PT goals addressed during session: Mobility/safety with mobility;Balance;Proper use of DME;Strengthening/ROM        AM-PAC PT "6 Clicks" Mobility   Outcome Measure  Help needed turning from your back to your side while in a flat bed without using bedrails?: A Lot Help needed moving from lying on your back to sitting on the side of a flat bed without using bedrails?: A Little Help needed moving to and from a bed to a chair (including a wheelchair)?: A Lot Help needed standing up from a chair using your arms (e.g., wheelchair or bedside chair)?: A Lot Help needed to walk in hospital room?: Total Help needed climbing 3-5 steps with a railing? : Total 6 Click Score: 11    End of Session Equipment Utilized During Treatment: Gait belt Activity Tolerance: Patient tolerated treatment well Patient left: in chair;with call bell/phone within reach Nurse Communication: Mobility status PT Visit Diagnosis: Difficulty in walking, not elsewhere classified (R26.2);Pain;Muscle weakness (generalized) (M62.81) Pain - Right/Left: Left Pain - part of body: Ankle and joints of foot     Time:  5188-4166 PT Time Calculation (min) (ACUTE ONLY): 33 min  Charges:  $Gait Training: 8-22 mins                     Leighton Roach, [place]  Pager [phone] Office [place] 09/12/2018, 11:34 AM

## 2018-09-12 NOTE — Progress Notes (Signed)
PROGRESS NOTE    April [name]  FKC:[id number] DOB: Jul 05, 1950 DOA: 09/07/2018 PCP: [name], MD    Brief Narrative:   April [name]  is a 68 y.o. female, with history of asthma, CAD, type 2 diabetes mellitus, morbid obesity, chronic right lower extremity wound, hypertension who came to hospital after patient developed pain in both feet left more than right.  Pain she describes as sharp pain more in the left heel.  Patient has chronic right lower extremity wound and is being followed by Dr. [name].  She gets wound care nurses provide wound care 3 times a week.  Patient denies fever or chills. Denies nausea vomiting or diarrhea. Denies chest pain or shortness of breath. Patient has diabetes mellitus which is diet controlled as per patient.  In the ED x-ray of the left ankle showed possible osteomyelitis involving left calcaneus.  Patient started on IV antibiotics.  Orthopedics was consulted by ED physician and they recommended patient to be transferred to [place]:   Active Problems:   Hyperlipidemia   Morbid (severe) obesity due to excess calories (HCC)   Essential hypertension   Bilateral leg edema   CKD (chronic kidney disease) stage 3, GFR 30-59 ml/min (HCC)   Diabetic peripheral neuropathy (HCC)   Osteomyelitis of ankle or foot, acute, left (HCC)   Osteomyelitis of ankle or foot, left, acute (HCC)   Achilles tendon sprain, left, initial encounter   Left calcaneus lytic changes Patient transferred from [facility] hospital for concern of possible underlying calcaneal osteomyelitis.  Patient is afebrile without leukocytosis.  X-ray left foot personally reviewed, with some lytic changes in the calcaneus with osseous perinei.  Patient was initially started on broad-spectrum antibiotics with vancomycin and aztreonam.  Patient was evaluated by her primary orthopedist, Dr. [name]; who does not believe that there is any clinical indication of osteomyelitis of  the left calcaneus.  Recommends physical therapy for ambulation and weightbearing as tolerated.  Silvadene/Santyl dressing changes and treatment to her right lower extremity wound.  Unfortunately due to patient's body habitus a fracture boot unable to be fitted, which may have helped her with likely Achilles tear. --Discontinued antibiotics 8/6. --PT/OT recommends CIR admission --CIR evaluation 09/09/2018, states patient was denied by insurance on previous admission.  Patient discussed with her insurance company on 09/09/2018, and states that her insurance will now improve a inpatient rehabilitation admission.  We will have CIR reevaluate patient on Monday when they return. --Dr. [name] recommends outpatient follow-up  Acute gout flare Patient complaining of multiple joint pains to include bilateral knees and left ankle.   Uric acid 12.5-->11.0. --Colchicine and allopurinol --started prednisone 40mg  PO daily 8/9; will start slow taper once symptoms improve  Type 2 diabetes mellitus Diet controlled at home.  Hemoglobin A1c 6.0. --Consistent carbohydrate diet --NovoLog insulin sliding scale for coverage if needed  CKD stage III Creatinine over the past year ranging from 1.32 to 2.45.  Creatinine on admission 2.04, appears to be within her baseline. --Cr 2.04-->1.76-->1.50-->1.57 --Avoid nephrotoxins, renally dose all medications  Chronic right leg ulceration Patient follows with wound care at home. --Wound care consulted, continue Santyl with dry gauze secured with Ace bandage with daily changes  Essential hypertension Blood pressure well controlled.  Continue carvedilol, spironolactone  COPD Not in acute exacerbation.  Continue montelukast, Dulera, as needed albuterol.  Chronic peripheral neuropathy Continue home gabapentin 100 mg p.o. 3 times daily and Trileptal 300mg  PO BID.  Morbid obesity BMI  60.44.  Discussed with patient that the majority of her chronic medical conditions points  back to her underlying severe morbid obesity.  Discussed with her needs aggressive weight loss measures as this complicates all facets of care.  DVT prophylaxis: Lovenox Code Status: Full code Family Communication: None Disposition Plan: Continue observation status, PT recommends CIR versus SNF.  CIR declined due to previous insurance decline on last hospitalization.  Patient with issues going to SNF due to current viral pandemic.  Patient discussed this with her insurance company via telephone on 09/09/2018, and states that they will now approve an inpatient rehabilitation stay.  We will have CIR reevaluate today.  Otherwise she will need to make a decision regarding SNF versus discharge back home with home health services if CIR declines.  Consultants:   Orthopedics, Dr. [name]  Procedures:   None  Antimicrobials:   Vancomycin 8/5 - 8/6  Aztreonam 8/5 - 8/6   Subjective: Patient seen and examined at bedside, resting comfortably in bed.  Patient continues with neuropathy pains and bilateral knee pain, improved since yesterday.  Patient denies headache, no fever/chills/night sweats, no nausea/vomiting/diarrhea, no chest pain, palpitations, no abdominal pain, no paresthesias.  No acute events overnight per nursing staff.  Objective: Vitals:   09/12/18 0438 09/12/18 0612 09/12/18 0813 09/12/18 0818  BP: 133/66   124/63  Pulse: 72   77  Resp: 16   16  Temp: 98.6 F (37 C)   98.4 F (36.9 C)  TempSrc: Oral   Oral  SpO2: 94% 96% 95% 94%  Weight:      Height:        Intake/Output Summary (Last 24 hours) at 09/12/2018 1032 Last data filed at 09/12/2018 0900 Gross per 24 hour  Intake 840 ml  Output 2200 ml  Net -1360 ml   Filed Weights   09/07/18 2339 09/08/18 0235  Weight: (!) 159.8 kg (!) 159.8 kg    Examination:  General exam: Appears calm and comfortable, morbidly obese Respiratory system: Clear to auscultation. Respiratory effort normal. Cardiovascular system: S1 & S2  heard, RRR. No JVD, murmurs, rubs, gallops or clicks. No pedal edema. Gastrointestinal system: Abdomen is nondistended, protuberant abdomen, soft and nontender. No organomegaly or masses felt. Normal bowel sounds heard. Central nervous system: Alert and oriented. No focal neurological deficits. Extremities: Symmetric 5 x 5 power. Skin: Right lower extremity ulcerative lesion, see attached picture Psychiatry: Judgement and insight appear normal. Mood & affect appropriate.       Data Reviewed: I have personally reviewed following labs and imaging studies  CBC: Recent Labs  Lab 09/07/18 1530 09/08/18 0020  WBC 4.1 4.0  NEUTROABS 2.1  --   HGB 10.7* 10.3*  HCT 35.6* 33.3*  MCV 96.5 93.5  PLT 159 762*   Basic Metabolic Panel: Recent Labs  Lab 09/07/18 1530 09/08/18 0020 09/11/18 0649 09/12/18 0451  NA 141  --  138 138  K 4.2  --  3.4* 3.8  CL 101  --  96* 97*  CO2 30  --  31 31  GLUCOSE 103*  --  99 124*  BUN 28*  --  22 24*  CREATININE 2.04* 1.76* 1.50* 1.57*  CALCIUM 9.2  --  8.4* 8.8*  MG  --   --   --  2.1   GFR: Estimated Creatinine Clearance: 52.4 mL/min (A) (by C-G formula based on SCr of 1.57 mg/dL (H)). Liver Function Tests: No results for input(s): AST, ALT, ALKPHOS, BILITOT, PROT, ALBUMIN in the  last 168 hours. No results for input(s): LIPASE, AMYLASE in the last 168 hours. No results for input(s): AMMONIA in the last 168 hours. Coagulation Profile: No results for input(s): INR, PROTIME in the last 168 hours. Cardiac Enzymes: No results for input(s): CKTOTAL, CKMB, CKMBINDEX, TROPONINI in the last 168 hours. BNP (last 3 results) No results for input(s): PROBNP in the last 8760 hours. HbA1C: No results for input(s): HGBA1C in the last 72 hours. CBG: Recent Labs  Lab 09/11/18 0751 09/11/18 1132 09/11/18 1726 09/11/18 2207 09/12/18 0707  GLUCAP 95 145* 223* 204* 113*   Lipid Profile: No results for input(s): CHOL, HDL, LDLCALC, TRIG, CHOLHDL,  LDLDIRECT in the last 72 hours. Thyroid Function Tests: No results for input(s): TSH, T4TOTAL, FREET4, T3FREE, THYROIDAB in the last 72 hours. Anemia Panel: No results for input(s): VITAMINB12, FOLATE, FERRITIN, TIBC, IRON, RETICCTPCT in the last 72 hours. Sepsis Labs: No results for input(s): PROCALCITON, LATICACIDVEN in the last 168 hours.  Recent Results (from the past 240 hour(s))  SARS Coronavirus 2 [facility] order, Performed in [facility] hospital lab) Nasopharyngeal Nasopharyngeal Swab     Status: None   Collection Time: 09/07/18  5:06 PM   Specimen: Nasopharyngeal Swab  Result Value Ref Range Status   SARS Coronavirus 2 NEGATIVE NEGATIVE Final    Comment: (NOTE) If result is NEGATIVE SARS-CoV-2 target nucleic acids are NOT DETECTED. The SARS-CoV-2 RNA is generally detectable in upper and lower  respiratory specimens during the acute phase of infection. The lowest  concentration of SARS-CoV-2 viral copies this assay can detect is 250  copies / mL. A negative result does not preclude SARS-CoV-2 infection  and should not be used as the sole basis for treatment or other  patient management decisions.  A negative result may occur with  improper specimen collection / handling, submission of specimen other  than nasopharyngeal swab, presence of viral mutation(s) within the  areas targeted by this assay, and inadequate number of viral copies  (<250 copies / mL). A negative result must be combined with clinical  observations, patient history, and epidemiological information. If result is POSITIVE SARS-CoV-2 target nucleic acids are DETECTED. The SARS-CoV-2 RNA is generally detectable in upper and lower  respiratory specimens dur ing the acute phase of infection.  Positive  results are indicative of active infection with SARS-CoV-2.  Clinical  correlation with patient history and other diagnostic information is  necessary to determine patient infection status.  Positive results do   not rule out bacterial infection or co-infection with other viruses. If result is PRESUMPTIVE POSTIVE SARS-CoV-2 nucleic acids MAY BE PRESENT.   A presumptive positive result was obtained on the submitted specimen  and confirmed on repeat testing.  While 2019 novel coronavirus  (SARS-CoV-2) nucleic acids may be present in the submitted sample  additional confirmatory testing may be necessary for epidemiological  and / or clinical management purposes  to differentiate between  SARS-CoV-2 and other Sarbecovirus currently known to infect humans.  If clinically indicated additional testing with an alternate test  methodology [id number]) is advised. The SARS-CoV-2 RNA is generally  detectable in upper and lower respiratory sp ecimens during the acute  phase of infection. The expected result is Negative. Fact Sheet for Patients:  StrictlyIdeas.no Fact Sheet for Healthcare Providers: BankingDealers.co.za This test is not yet approved or cleared by the [place] FDA and has been authorized for detection and/or diagnosis of SARS-CoV-2 by FDA under an Emergency Use Authorization (EUA).  This EUA will remain  in effect (meaning this test can be used) for the duration of the COVID-19 declaration under Section 564(b)(1) of the Act, 21 U.S.C. section 360bbb-3(b)(1), unless the authorization is terminated or revoked sooner. Performed at [facility], [place] [address], [place], [place] [address]          Radiology Studies: No results found.      Scheduled Meds: . allopurinol  100 mg Oral Daily  . aspirin EC  81 mg Oral QHS  . carvedilol  25 mg Oral Q breakfast  . colchicine  0.6 mg Oral Daily  . collagenase   Topical Daily  . enoxaparin (LOVENOX) injection  80 mg Subcutaneous Q24H  . famotidine  20 mg Oral BID  . fluticasone furoate-vilanterol  1 puff Inhalation Daily  . folic acid  595 mcg Oral QHS  . furosemide  120  mg Oral Q breakfast  . furosemide  80 mg Oral q1800  . gabapentin  100 mg Oral TID  . insulin aspart  0-9 Units Subcutaneous TID WC  . montelukast  10 mg Oral QHS  . Oxcarbazepine  300 mg Oral BID  . predniSONE  40 mg Oral Q breakfast  . simvastatin  40 mg Oral QHS  . spironolactone  25 mg Oral Daily   Continuous Infusions:    LOS: 1 day    Time spent: 29 minutes spent on chart review, personally reviewed imaging studies and labs, discussion with nursing staff, consultants, updating family and interview/physical exam; more than 50% of that time was spent in counseling and/or coordination of care.     J [place] DO [facility] Pager [phone]  If 7PM-7AM, please contact night-coverage [email or web address] Password [facility] 09/12/2018, 10:32 AM

## 2018-09-12 NOTE — Progress Notes (Signed)
Inpatient Rehabilitation Admissions Coordinator  Re consult for rehab received. I met with patient with PT and OT at bedside. I explained once again, that I question the medical need for an inpt hospital rehab with her chronic issues vs SNF rehab for continued therapy. I will though open case with [facility] Medicare today, once I get the updated therapy assessment today to request an inpt rehab admission. Will let insurance provider determine rehab venue. I have discussed this with Dr. Naaman Plummer, our Medical director. I will follow up tomorrow with a determination by her [facility] Medicare. Pt continues under observation status.  Danne Baxter, RN, MSN Rehab Admissions Coordinator [phone] 09/12/2018 11:08 AM

## 2018-09-12 NOTE — Progress Notes (Signed)
Occupational Therapy Treatment Patient Details Name: April [name] MRN: [id number] DOB: 07-11-1950 Today's Date: 09/12/2018    History of present illness PT is a 68 y/o female admitted secondary to worsening LLE and L foot pain. Pt with RLE pain at baseline. Imaging revealed L calcaneous osteomyelitis. Pt also with likely L achilles strain vs partial tear. PMH includes obesity, CAD, asthma, HTN, and DM.    OT comments  Pt making progress with functional goals and is highly motivated to return to home at [facility]. Pt was able to stand and pivot with use of RW today instead of stedy  Min A +2 for mobility. Pt has significant B knee pain when nearing weight. Continuing to recommend CIR at this time if insurance approves, if not, do not feel that pt is safe to be home currently and would recommend SNF. OT will continue to follow.     Follow Up Recommendations  CIR    Equipment Recommendations  None recommended by OT    Recommendations for Other Services      Precautions / Restrictions Precautions Precautions: Fall Precaution Comments: pt denies recent falls Restrictions Weight Bearing Restrictions: No LLE Weight Bearing: Weight bearing as tolerated Other Position/Activity Restrictions: WBAT per ortho notes.        Mobility Bed Mobility Overal bed mobility: Needs Assistance Bed Mobility: Supine to Sit     Supine to sit: Min assist;Min guard     General bed mobility comments: pt came to sitting from [facility] elevated position, increased time needed, min A for support and mvmt of LE's  Transfers Overall transfer level: Needs assistance Equipment used: Rolling walker (2 wheeled) Transfers: Sit to/from [facility] Sit to Stand: +2 physical assistance;Min assist Stand pivot transfers: Min assist;+2 physical assistance       General transfer comment: pt unable to push on bed and achieve standing, hands on RW and RW stabilized. Increased time and effort and min A +2  for support. Pt took pivot steps to recliner with RW and min A +2. Pt lacks sufficient knee control for controlled sitting to chair. Practiced standing from recliner with arm rests and pt had more pain in knees but was able to push from arm rests to achieve standing.     Balance Overall balance assessment: Needs assistance Sitting-balance support: No upper extremity supported;Feet supported Sitting balance-Leahy Scale: Good Sitting balance - Comments: pt able to wt shift and scoot without LOB   Standing balance support: Bilateral upper extremity supported Standing balance-Leahy Scale: Poor Standing balance comment: reliant on UE support in static stand and with decreased ability to correct for imbalance with ankle, hip, or stepping strategy, high risk for continued falls                           ADL either performed or assessed with clinical judgement   ADL Overall ADL's : Needs assistance/impaired                         Toilet Transfer: Minimal assistance;+2 for physical assistance;RW;Stand-pivot Toilet Transfer Details (indicate cue type and reason): no physical assistance required to power up to stand with transfer from chair to [facility] in front of sink. Min A required safety due to patient reporting feeling dizzy and demonstrated some instabilty.  Toileting- Clothing Manipulation and Hygiene: Total assistance               Vision Baseline Vision/History:  Wears glasses Wears Glasses: At all times Patient Visual Report: No change from baseline     Perception     Praxis      Cognition Arousal/Alertness: Awake/alert Behavior During Therapy: WFL for tasks assessed/performed Overall Cognitive Status: Within Functional Limits for tasks assessed                                 General Comments: Very determined to work through pain and progress        Exercises Exercises: General Lower Extremity General Exercises - Lower Extremity Ankle  Circles/Pumps: AAROM;Both;10 reps;Seated Long Arc Quad: AROM;Both;10 reps;Seated Hip Flexion/Marching: AROM;Both;10 reps;Seated   Shoulder Instructions       General Comments no tremors noted today.     Pertinent Vitals/ Pain       Pain Assessment: Faces Faces Pain Scale: Hurts even more Pain Location: BLE during stance, specifically knees Pain Descriptors / Indicators: Grimacing;Guarding;Aching Pain Intervention(s): Limited activity within patient's tolerance;Monitored during session;Repositioned  Home Living                                          Prior Functioning/Environment              Frequency  Min 2X/week        Progress Toward Goals  OT Goals(current goals can now be found in the care plan section)  Progress towards OT goals: Progressing toward goals  Acute Rehab OT Goals Patient Stated Goal: to go to rehab on the 4th floor  Plan Frequency remains appropriate    Co-evaluation    PT/OT/SLP Co-Evaluation/Treatment: Yes Reason for Co-Treatment: For patient/therapist safety PT goals addressed during session: Mobility/safety with mobility;Balance;Proper use of DME;Strengthening/ROM OT goals addressed during session: ADL's and self-care      AM-PAC OT "6 Clicks" Daily Activity     Outcome Measure   Help from another person eating meals?: None Help from another person taking care of personal grooming?: A Little Help from another person toileting, which includes using toliet, bedpan, or urinal?: Total Help from another person bathing (including washing, rinsing, drying)?: A Lot Help from another person to put on and taking off regular upper body clothing?: A Little Help from another person to put on and taking off regular lower body clothing?: Total 6 Click Score: 14    End of Session Equipment Utilized During Treatment: Gait belt;Rolling walker  OT Visit Diagnosis: Unsteadiness on feet (R26.81);History of falling (Z91.81);Muscle  weakness (generalized) (M62.81);Pain Pain - Right/Left: (bilaterally) Pain - part of body: Knee   Activity Tolerance Patient limited by pain   Patient Left in chair;with call bell/phone within reach   Nurse Communication          Time: 9826-4158 OT Time Calculation (min): 34 min  Charges: OT General Charges $OT Visit: 1 Visit OT Treatments $Therapeutic Activity: 8-22 mins     Britt Bottom 09/12/2018, 12:57 PM

## 2018-09-12 NOTE — Plan of Care (Signed)

## 2018-09-12 NOTE — Plan of Care (Signed)
  Problem: Education: Goal: Knowledge of General Education information will improve Description: Including pain rating scale, medication(s)/side effects and non-pharmacologic comfort measures Outcome: Progressing   Problem: Activity: Goal: Risk for activity intolerance will decrease Outcome: Progressing   Problem: Elimination: Goal: Will not experience complications related to bowel motility Outcome: Progressing   Problem: Pain Managment: Goal: General experience of comfort will improve Outcome: Progressing   Problem: Safety: Goal: Ability to remain free from injury will improve Outcome: Progressing   Problem: Skin Integrity: Goal: Risk for impaired skin integrity will decrease Outcome: Progressing   

## 2018-09-13 LAB — CBC
HCT: 32.9 % — ABNORMAL LOW (ref 36.0–46.0)
Hemoglobin: 10.3 g/dL — ABNORMAL LOW (ref 12.0–15.0)
MCH: 28.7 pg (ref 26.0–34.0)
MCHC: 31.3 g/dL (ref 30.0–36.0)
MCV: 91.6 fL (ref 80.0–100.0)
Platelets: 165 10*3/uL (ref 150–400)
RBC: 3.59 MIL/uL — ABNORMAL LOW (ref 3.87–5.11)
RDW: 14.3 % (ref 11.5–15.5)
WBC: 5.3 10*3/uL (ref 4.0–10.5)
nRBC: 0 % (ref 0.0–0.2)

## 2018-09-13 LAB — BASIC METABOLIC PANEL
Anion gap: 10 (ref 5–15)
BUN: 30 mg/dL — ABNORMAL HIGH (ref 8–23)
CO2: 32 mmol/L (ref 22–32)
Calcium: 8.7 mg/dL — ABNORMAL LOW (ref 8.9–10.3)
Chloride: 97 mmol/L — ABNORMAL LOW (ref 98–111)
Creatinine, Ser: 1.47 mg/dL — ABNORMAL HIGH (ref 0.44–1.00)
GFR calc Af Amer: 42 mL/min — ABNORMAL LOW (ref >60)
GFR calc non Af Amer: 36 mL/min — ABNORMAL LOW (ref >60)
Glucose, Bld: 133 mg/dL — ABNORMAL HIGH (ref 70–99)
Potassium: 3.8 mmol/L (ref 3.5–5.1)
Sodium: 139 mmol/L (ref 135–145)

## 2018-09-13 LAB — GLUCOSE, CAPILLARY
Glucose-Capillary: 107 mg/dL — ABNORMAL HIGH (ref 70–99)
Glucose-Capillary: 129 mg/dL — ABNORMAL HIGH (ref 70–99)
Glucose-Capillary: 238 mg/dL — ABNORMAL HIGH (ref 70–99)
Glucose-Capillary: 141 mg/dL — ABNORMAL HIGH (ref 70–99)

## 2018-09-13 LAB — URIC ACID: Uric Acid, Serum: 10.5 mg/dL — ABNORMAL HIGH (ref 2.5–7.1)

## 2018-09-13 NOTE — [place] FL2 (Signed)
[place] MEDICAID FL2 LEVEL OF CARE SCREENING TOOL     IDENTIFICATION  Patient Name: April [name] Birthdate: 04-Mar-1950 Sex: female Admission Date (Current Location): 09/07/2018  [facility] and [place] Number:  Herbalist and Address:  [facility], [place] [address] [place], [place] [address]      Provider Number:    Attending Physician Name and Address:  [place] Donnamarie Poag, DO  Relative Name and Phone Number:       Current Level of Care: Hospital Recommended Level of Care: [place] Prior Approval Number:    Date Approved/Denied:   PASRR Number: [id number]  Discharge Plan: SNF    Current Diagnoses: Patient Active Problem List   Diagnosis Date Noted  . Achilles tendon sprain, left, initial encounter   . Osteomyelitis of ankle or foot, acute, left ([place]) 09/07/2018  . Osteomyelitis of ankle or foot, left, acute ([place]) 09/07/2018  . Intractable pain 08/17/2018  . Acute on chronic kidney failure ([place]) 08/15/2018  . Onychomycosis 05/19/2018  . Idiopathic chronic venous hypertension of right lower extremity with ulcer and inflammation ([place]) 05/19/2018  . Traumatic wound dehiscence   . Skin lumps 01/14/2018  . Bilateral leg cramps 12/14/2017  . Dry skin dermatitis 06/29/2017  . Left shoulder pain 08/14/2016  . Meralgia paresthetica 07/16/2016  . Chronic left-sided low back pain with left-sided sciatica 06/02/2016  . Whole body pain 03/20/2016  . Diabetic peripheral neuropathy ([place]) 03/20/2016  . Osteopenia 01/11/2016  . CKD (chronic kidney disease) stage 3, GFR 30-59 ml/min (HCC) 01/02/2016  . Hand paresthesia 07/18/2015  . Carpal tunnel syndrome 06/07/2015  . Family history of colon cancer   . Diabetes mellitus with neurological manifestations ([place]) 04/18/2015  . Bilateral leg edema 04/11/2015  . Abnormality of gait 01/03/2015  . Hereditary and idiopathic peripheral neuropathy 01/03/2015  . GERD (gastroesophageal reflux  disease) 06/17/2014  . Hx of colonic polyps 12/15/2012  . Vitamin B12 deficiency 11/03/2012  . Intrinsic asthma 03/23/2012  . Chronic diastolic heart failure ([place]) 02/20/2011  . OSA (obstructive sleep apnea) 09/17/2010  . URINARY URGENCY 01/08/2010  . CAD, NATIVE VESSEL 11/20/2008  . Osteoarthritis of knees, bilateral 06/14/2008  . Hyperlipidemia 05/10/2007  . Essential hypertension 01/18/2007  . HYPOKALEMIA 04/30/2006  . Morbid (severe) obesity due to excess calories ([place]) 04/30/2006  . HX, PERSONAL, PEPTIC ULCER DISEASE 04/30/2006    Orientation RESPIRATION BLADDER Height & Weight     Self, Time, Situation, Place  Normal Continent Weight: (!) 159.8 kg Height:  5' 4.02" (162.6 cm)  BEHAVIORAL SYMPTOMS/MOOD NEUROLOGICAL BOWEL NUTRITION STATUS      Continent Diet  AMBULATORY STATUS COMMUNICATION OF NEEDS Skin   Extensive Assist(2 person assist)   Surgical wounds(s/p I &D right lower leg with skin graft 05/27/18)                       Personal Care Assistance Level of Assistance  Bathing, Dressing, Total care Bathing Assistance: Maximum assistance Feeding assistance: Independent Dressing Assistance: Maximum assistance Total Care Assistance: Maximum assistance   Functional Limitations Info  Sight Sight Info: Adequate        SPECIAL CARE FACTORS FREQUENCY  PT (By licensed PT), OT (By licensed OT)     PT Frequency: 5x/weekly OT Frequency: min 3x/weekly            Contractures Contractures Info: Not present    Additional Factors Info    Code Status Info: Full Code  Current Medications (09/13/2018):  This is the current hospital active medication list Current Facility-Administered Medications  Medication Dose Route Frequency Provider Last Rate Last Dose  . acetaminophen (TYLENOL) tablet 650 mg  650 mg Oral Q6H PRN Oswald Hillock, MD   650 mg at 09/12/18 0510   Or  . acetaminophen (TYLENOL) suppository 650 mg  650 mg Rectal Q6H PRN Oswald Hillock, MD      . albuterol (PROVENTIL) (2.5 MG/3ML) 0.083% nebulizer solution 2.5 mg  2.5 mg Nebulization Q6H PRN Oswald Hillock, MD   2.5 mg at 09/12/18 2103  . allopurinol (ZYLOPRIM) tablet 100 mg  100 mg Oral Daily Oswald Hillock, MD   100 mg at 09/12/18 0946  . aspirin EC tablet 81 mg  81 mg Oral QHS Oswald Hillock, MD   81 mg at 09/12/18 2219  . carvedilol (COREG) tablet 25 mg  25 mg Oral Q breakfast Oswald Hillock, MD   25 mg at 09/12/18 0946  . colchicine tablet 0.6 mg  0.6 mg Oral Daily Oswald Hillock, MD   0.6 mg at 09/12/18 0945  . collagenase (SANTYL) ointment   Topical Daily [name], MD      . enoxaparin (LOVENOX) injection 80 mg  80 mg Subcutaneous Q24H [place] Eric J, DO   80 mg at 09/12/18 2595  . famotidine (PEPCID) tablet 20 mg  20 mg Oral BID Oswald Hillock, MD   20 mg at 09/12/18 2220  . fluticasone furoate-vilanterol (BREO ELLIPTA) 200-25 MCG/INH 1 puff  1 puff Inhalation Daily Oswald Hillock, MD   1 puff at 09/13/18 0813  . folic acid (FOLVITE) tablet 0.5 mg  500 mcg Oral QHS Darrick Meigs, Gagan S, MD   0.5 mg at 09/12/18 2220  . furosemide (LASIX) tablet 120 mg  120 mg Oral Q breakfast Oswald Hillock, MD   120 mg at 09/12/18 0945  . furosemide (LASIX) tablet 80 mg  80 mg Oral q1800 Lenore Cordia, MD   80 mg at 09/12/18 1652  . gabapentin (NEURONTIN) capsule 100 mg  100 mg Oral TID Oswald Hillock, MD   100 mg at 09/12/18 2220  . hydrOXYzine (ATARAX/VISTARIL) tablet 25 mg  25 mg Oral Q6H PRN Oswald Hillock, MD      . insulin aspart (novoLOG) injection 0-9 Units  0-9 Units Subcutaneous TID WC Oswald Hillock, MD   3 Units at 09/12/18 1652  . montelukast (SINGULAIR) tablet 10 mg  10 mg Oral QHS Oswald Hillock, MD   10 mg at 09/12/18 2219  . Muscle Rub CREA   Topical BID PRN Rayburn, Neta Mends, PA-C      . ondansetron (ZOFRAN) tablet 4 mg  4 mg Oral Q6H PRN Oswald Hillock, MD       Or  . ondansetron (ZOFRAN) injection 4 mg  4 mg Intravenous Q6H PRN Oswald Hillock, MD      . Oxcarbazepine  (TRILEPTAL) tablet 300 mg  300 mg Oral BID Oswald Hillock, MD   300 mg at 09/12/18 2231  . oxyCODONE (Oxy IR/ROXICODONE) immediate release tablet 5 mg  5 mg Oral Q6H PRN [place] Eric J, DO   5 mg at 09/12/18 2230  . predniSONE (DELTASONE) tablet 40 mg  40 mg Oral Q breakfast [place] Eric J, DO   40 mg at 09/12/18 0946  . simvastatin (ZOCOR) tablet 40 mg  40 mg Oral QHS Oswald Hillock, MD  40 mg at 09/12/18 2231  . spironolactone (ALDACTONE) tablet 25 mg  25 mg Oral Daily Oswald Hillock, MD   25 mg at 09/12/18 8307     Discharge Medications: Please see discharge summary for a list of discharge medications.  Relevant Imaging Results:  Relevant Lab Results:   Additional Information SS# [id number]  Ninfa Meeker, RN

## 2018-09-13 NOTE — Care Management (Signed)
Case manager informed patient that April [name] has accepted her, bed will not be available until Thursday, 09/15/18. Patient understands, expresses satisfaction in knowing she is going there. Case Manager updated Dr. [place] as well.     Ricki Miller, RN BSN Case Manager [phone]

## 2018-09-13 NOTE — Progress Notes (Signed)
PROGRESS NOTE    April [name]  KZS:[id number] DOB: 1950-09-19 DOA: 09/07/2018 PCP: [name], MD    Brief Narrative:   April [name]  is a 68 y.o. female, with history of asthma, CAD, type 2 diabetes mellitus, morbid obesity, chronic right lower extremity wound, hypertension who came to hospital after patient developed pain in both feet left more than right.  Pain she describes as sharp pain more in the left heel.  Patient has chronic right lower extremity wound and is being followed by Dr. [name].  She gets wound care nurses provide wound care 3 times a week.  Patient denies fever or chills. Denies nausea vomiting or diarrhea. Denies chest pain or shortness of breath. Patient has diabetes mellitus which is diet controlled as per patient.  In the ED x-ray of the left ankle showed possible osteomyelitis involving left calcaneus.  Patient started on IV antibiotics.  Orthopedics was consulted by ED physician and they recommended patient to be transferred to [place]:   Active Problems:   Hyperlipidemia   Morbid (severe) obesity due to excess calories (HCC)   Essential hypertension   Bilateral leg edema   CKD (chronic kidney disease) stage 3, GFR 30-59 ml/min (HCC)   Diabetic peripheral neuropathy (HCC)   Osteomyelitis of ankle or foot, acute, left (HCC)   Osteomyelitis of ankle or foot, left, acute (HCC)   Achilles tendon sprain, left, initial encounter   Left calcaneus lytic changes Patient transferred from [facility] hospital for concern of possible underlying calcaneal osteomyelitis.  Patient is afebrile without leukocytosis.  X-ray left foot personally reviewed, with some lytic changes in the calcaneus with osseous perinei.  Patient was initially started on broad-spectrum antibiotics with vancomycin and aztreonam.  Patient was evaluated by her primary orthopedist, Dr. [name]; who does not believe that there is any clinical indication of osteomyelitis of  the left calcaneus.  Recommends physical therapy for ambulation and weightbearing as tolerated.  Silvadene/Santyl dressing changes and treatment to her right lower extremity wound.  Unfortunately due to patient's body habitus a fracture boot unable to be fitted, which may have helped her with likely Achilles tear. --Discontinued antibiotics 8/6. --PT/OT recommends CIR admission; did not receive authorization from her insurance company --Pending SNF, accepted at [facility], repeat COVID-19 ordered 8/11 --Dr. [name] recommends outpatient follow-up  Acute gout flare Patient complaining of multiple joint pains to include bilateral knees and left ankle.   Uric acid 12.5-->11.0. --Colchicine and allopurinol --started prednisone 40mg  PO daily 8/9; will start slow taper once symptoms improve  Type 2 diabetes mellitus Diet controlled at home.  Hemoglobin A1c 6.0. --Consistent carbohydrate diet --NovoLog insulin sliding scale for coverage if needed  CKD stage III Creatinine over the past year ranging from 1.32 to 2.45.  Creatinine on admission 2.04, appears to be within her baseline. --Cr 2.04-->1.76-->1.50-->1.57-->1.47 --Avoid nephrotoxins, renally dose all medications  Chronic right leg ulceration Patient follows with wound care at home. --Wound care consulted, continue Santyl with dry gauze secured with Ace bandage with daily changes  Essential hypertension Blood pressure well controlled.  Continue carvedilol, spironolactone  COPD Not in acute exacerbation.  Continue montelukast, Dulera, as needed albuterol.  Chronic peripheral neuropathy Continue home gabapentin 100 mg p.o. TID and Trileptal 300mg  PO BID.  Morbid obesity BMI 60.44.  Discussed with patient that the majority of her chronic medical conditions points back to her underlying severe morbid obesity.  Discussed with her needs aggressive weight  loss measures as this complicates all facets of care.  DVT prophylaxis: Lovenox  Code Status: Full code Family Communication: None Disposition Plan: Continue inpatient, pending transfer to [facility] SNF, pending repeat COVID-19 test ordered on 09/13/2018  Consultants:   Orthopedics, Dr. [name]  Procedures:   None  Antimicrobials:   Vancomycin 8/5 - 8/6  Aztreonam 8/5 - 8/6   Subjective: Patient seen and examined at bedside, resting comfortably in bed.  Patient continues with neuropathy pains and bilateral knee pain, slowly improved.  Patient denies headache, no fever/chills/night sweats, no nausea/vomiting/diarrhea, no chest pain, palpitations, no abdominal pain, no paresthesias.  No acute events overnight per nursing staff.  Objective: Vitals:   09/12/18 2032 09/13/18 0525 09/13/18 0813 09/13/18 0858  BP: (!) 124/53 121/67  118/60  Pulse: 72 67  74  Resp: 18 18  16   Temp: 98.6 F (37 C) 98.2 F (36.8 C)  97.6 F (36.4 C)  TempSrc: Oral Oral  Oral  SpO2: 95% 96% 97% 95%  Weight:      Height:        Intake/Output Summary (Last 24 hours) at 09/13/2018 1359 Last data filed at 09/13/2018 0900 Gross per 24 hour  Intake 1440 ml  Output 2650 ml  Net -1210 ml   Filed Weights   09/07/18 2339 09/08/18 0235  Weight: (!) 159.8 kg (!) 159.8 kg    Examination:  General exam: Appears calm and comfortable, morbidly obese Respiratory system: Clear to auscultation. Respiratory effort normal. Cardiovascular system: S1 & S2 heard, RRR. No JVD, murmurs, rubs, gallops or clicks. No pedal edema. Gastrointestinal system: Abdomen is nondistended, protuberant abdomen, soft and nontender. No organomegaly or masses felt. Normal bowel sounds heard. Central nervous system: Alert and oriented. No focal neurological deficits. Extremities: Symmetric 5 x 5 power. Skin: Right lower extremity ulcerative lesion, see attached picture Psychiatry: Judgement and insight appear normal. Mood & affect appropriate.       Data Reviewed: I have personally reviewed following labs  and imaging studies  CBC: Recent Labs  Lab 09/07/18 1530 09/08/18 0020 09/13/18 0305  WBC 4.1 4.0 5.3  NEUTROABS 2.1  --   --   HGB 10.7* 10.3* 10.3*  HCT 35.6* 33.3* 32.9*  MCV 96.5 93.5 91.6  PLT 159 134* 630   Basic Metabolic Panel: Recent Labs  Lab 09/07/18 1530 09/08/18 0020 09/11/18 0649 09/12/18 0451 09/13/18 0305  NA 141  --  138 138 139  K 4.2  --  3.4* 3.8 3.8  CL 101  --  96* 97* 97*  CO2 30  --  31 31 32  GLUCOSE 103*  --  99 124* 133*  BUN 28*  --  22 24* 30*  CREATININE 2.04* 1.76* 1.50* 1.57* 1.47*  CALCIUM 9.2  --  8.4* 8.8* 8.7*  MG  --   --   --  2.1  --    GFR: Estimated Creatinine Clearance: 55.9 mL/min (A) (by C-G formula based on SCr of 1.47 mg/dL (H)). Liver Function Tests: No results for input(s): AST, ALT, ALKPHOS, BILITOT, PROT, ALBUMIN in the last 168 hours. No results for input(s): LIPASE, AMYLASE in the last 168 hours. No results for input(s): AMMONIA in the last 168 hours. Coagulation Profile: No results for input(s): INR, PROTIME in the last 168 hours. Cardiac Enzymes: No results for input(s): CKTOTAL, CKMB, CKMBINDEX, TROPONINI in the last 168 hours. BNP (last 3 results) No results for input(s): PROBNP in the last 8760 hours. HbA1C: No results for input(s):  HGBA1C in the last 72 hours. CBG: Recent Labs  Lab 09/12/18 1151 09/12/18 1620 09/12/18 2141 09/13/18 0735 09/13/18 1153  GLUCAP 127* 221* 167* 107* 129*   Lipid Profile: No results for input(s): CHOL, HDL, LDLCALC, TRIG, CHOLHDL, LDLDIRECT in the last 72 hours. Thyroid Function Tests: No results for input(s): TSH, T4TOTAL, FREET4, T3FREE, THYROIDAB in the last 72 hours. Anemia Panel: No results for input(s): VITAMINB12, FOLATE, FERRITIN, TIBC, IRON, RETICCTPCT in the last 72 hours. Sepsis Labs: No results for input(s): PROCALCITON, LATICACIDVEN in the last 168 hours.  Recent Results (from the past 240 hour(s))  SARS Coronavirus 2 [facility] order, Performed in [facility] hospital lab) Nasopharyngeal Nasopharyngeal Swab     Status: None   Collection Time: 09/07/18  5:06 PM   Specimen: Nasopharyngeal Swab  Result Value Ref Range Status   SARS Coronavirus 2 NEGATIVE NEGATIVE Final    Comment: (NOTE) If result is NEGATIVE SARS-CoV-2 target nucleic acids are NOT DETECTED. The SARS-CoV-2 RNA is generally detectable in upper and lower  respiratory specimens during the acute phase of infection. The lowest  concentration of SARS-CoV-2 viral copies this assay can detect is 250  copies / mL. A negative result does not preclude SARS-CoV-2 infection  and should not be used as the sole basis for treatment or other  patient management decisions.  A negative result may occur with  improper specimen collection / handling, submission of specimen other  than nasopharyngeal swab, presence of viral mutation(s) within the  areas targeted by this assay, and inadequate number of viral copies  (<250 copies / mL). A negative result must be combined with clinical  observations, patient history, and epidemiological information. If result is POSITIVE SARS-CoV-2 target nucleic acids are DETECTED. The SARS-CoV-2 RNA is generally detectable in upper and lower  respiratory specimens dur ing the acute phase of infection.  Positive  results are indicative of active infection with SARS-CoV-2.  Clinical  correlation with patient history and other diagnostic information is  necessary to determine patient infection status.  Positive results do  not rule out bacterial infection or co-infection with other viruses. If result is PRESUMPTIVE POSTIVE SARS-CoV-2 nucleic acids MAY BE PRESENT.   A presumptive positive result was obtained on the submitted specimen  and confirmed on repeat testing.  While 2019 novel coronavirus  (SARS-CoV-2) nucleic acids may be present in the submitted sample  additional confirmatory testing may be necessary for epidemiological  and / or clinical management  purposes  to differentiate between  SARS-CoV-2 and other Sarbecovirus currently known to infect humans.  If clinically indicated additional testing with an alternate test  methodology [phone]) is advised. The SARS-CoV-2 RNA is generally  detectable in upper and lower respiratory sp ecimens during the acute  phase of infection. The expected result is Negative. Fact Sheet for Patients:  StrictlyIdeas.no Fact Sheet for Healthcare Providers: BankingDealers.co.za This test is not yet approved or cleared by the [place] FDA and has been authorized for detection and/or diagnosis of SARS-CoV-2 by FDA under an Emergency Use Authorization (EUA).  This EUA will remain in effect (meaning this test can be used) for the duration of the COVID-19 declaration under Section 564(b)(1) of the Act, 21 U.S.C. section 360bbb-3(b)(1), unless the authorization is terminated or revoked sooner. Performed at [facility], [place] [address] [place], [place] [address]          Radiology Studies: No results found.      Scheduled Meds: . allopurinol  100  mg Oral Daily  . aspirin EC  81 mg Oral QHS  . carvedilol  25 mg Oral Q breakfast  . colchicine  0.6 mg Oral Daily  . collagenase   Topical Daily  . enoxaparin (LOVENOX) injection  80 mg Subcutaneous Q24H  . famotidine  20 mg Oral BID  . fluticasone furoate-vilanterol  1 puff Inhalation Daily  . folic acid  721 mcg Oral QHS  . furosemide  120 mg Oral Q breakfast  . furosemide  80 mg Oral q1800  . gabapentin  100 mg Oral TID  . insulin aspart  0-9 Units Subcutaneous TID WC  . montelukast  10 mg Oral QHS  . Oxcarbazepine  300 mg Oral BID  . predniSONE  40 mg Oral Q breakfast  . simvastatin  40 mg Oral QHS  . spironolactone  25 mg Oral Daily   Continuous Infusions:    LOS: 1 day    Time spent: 29 minutes spent on chart review, personally reviewed imaging studies and labs,  discussion with nursing staff, consultants, updating family and interview/physical exam; more than 50% of that time was spent in counseling and/or coordination of care.    Bea Duren J [place] DO [facility] Pager [phone]  If 7PM-7AM, please contact night-coverage [email or web address] Password [facility] 09/13/2018, 1:59 PM

## 2018-09-13 NOTE — Care Management (Signed)
Case manager contacted April [name] at [facility], they will be able to patient on Wednesday or Thursday, they are waiting for discharge. CM will notify Patient and MD.     Ricki Miller, RN BSN Case Manager [phone]

## 2018-09-13 NOTE — TOC Progression Note (Signed)
Transition of Care [facility]) - Progression Note    Patient Details  Name: April [name] MRN: [id number] Date of Birth: 03/19/1950  [facility] of [facility] CM/SW Contact  Loletha Grayer Beverely Pace, RN Phone Number: 09/13/2018, 10:54 AM  Clinical Narrative:   Case manager spoke with patient this am concerning discharge plans. [employer] has denied request for patient to go to Inpatient rehab. Patient says she has contacted Claiborne Billings at [facility] concerning a bed. Case manager has lleft voice message for Claiborne Billings, faxed FL2 via the Hub. Will continue to follow.    Expected Discharge Plan: Skilled Nursing Facility Barriers to Discharge: Ship broker, Continued Medical Work up  Expected Discharge Plan and Services Expected Discharge Plan: [place] In-house Referral: Clinical Social Work Discharge Planning Services: [employer] Consult Post Acute Care Choice: [place] arrangements for the past 2 months: Single Family Home                 DME Arranged: N/A DME Agency: NA       HH Arranged: NA HH Agency: NA         Social Determinants of Health (SDOH) Interventions    Readmission Risk Interventions No flowsheet data found.

## 2018-09-13 NOTE — [place] FL2 (Deleted)
[place] MEDICAID FL2 LEVEL OF CARE SCREENING TOOL     IDENTIFICATION  Patient Name: April [name] Birthdate: Apr 26, 1950 Sex: female Admission Date (Current Location): 09/07/2018  [facility] and [place] Number:  Herbalist and Address:  [facility] [place], [place] [address] [place], [place] [address]      Provider Number:    Attending Physician Name and Address:  [place] Donnamarie Poag, DO  Relative Name and Phone Number:       Current Level of Care: Hospital Recommended Level of Care: [place] Prior Approval Number:    Date Approved/Denied:   PASRR Number: [id number]  Discharge Plan: SNF    Current Diagnoses: Patient Active Problem List   Diagnosis Date Noted  . Achilles tendon sprain, left, initial encounter   . Osteomyelitis of ankle or foot, acute, left ([place]) 09/07/2018  . Osteomyelitis of ankle or foot, left, acute ([place]) 09/07/2018  . Intractable pain 08/17/2018  . Acute on chronic kidney failure ([place]) 08/15/2018  . Onychomycosis 05/19/2018  . Idiopathic chronic venous hypertension of right lower extremity with ulcer and inflammation ([place]) 05/19/2018  . Traumatic wound dehiscence   . Skin lumps 01/14/2018  . Bilateral leg cramps 12/14/2017  . Dry skin dermatitis 06/29/2017  . Left shoulder pain 08/14/2016  . Meralgia paresthetica 07/16/2016  . Chronic left-sided low back pain with left-sided sciatica 06/02/2016  . Whole body pain 03/20/2016  . Diabetic peripheral neuropathy ([place]) 03/20/2016  . Osteopenia 01/11/2016  . CKD (chronic kidney disease) stage 3, GFR 30-59 ml/min (HCC) 01/02/2016  . Hand paresthesia 07/18/2015  . Carpal tunnel syndrome 06/07/2015  . Family history of colon cancer   . Diabetes mellitus with neurological manifestations ([place]) 04/18/2015  . Bilateral leg edema 04/11/2015  . Abnormality of gait 01/03/2015  . Hereditary and idiopathic peripheral neuropathy 01/03/2015  . GERD (gastroesophageal reflux  disease) 06/17/2014  . Hx of colonic polyps 12/15/2012  . Vitamin B12 deficiency 11/03/2012  . Intrinsic asthma 03/23/2012  . Chronic diastolic heart failure ([place]) 02/20/2011  . OSA (obstructive sleep apnea) 09/17/2010  . URINARY URGENCY 01/08/2010  . CAD, NATIVE VESSEL 11/20/2008  . Osteoarthritis of knees, bilateral 06/14/2008  . Hyperlipidemia 05/10/2007  . Essential hypertension 01/18/2007  . HYPOKALEMIA 04/30/2006  . Morbid (severe) obesity due to excess calories ([place]) 04/30/2006  . HX, PERSONAL, PEPTIC ULCER DISEASE 04/30/2006    Orientation RESPIRATION BLADDER Height & Weight     Self, Time, Situation, Place  Normal Continent Weight: (!) 159.8 kg Height:  5' 4.02" (162.6 cm)  BEHAVIORAL SYMPTOMS/MOOD NEUROLOGICAL BOWEL NUTRITION STATUS      Continent Diet  AMBULATORY STATUS COMMUNICATION OF NEEDS Skin   Extensive Assist(2 person assist)   Surgical wounds(s/p I &D right lower leg with skin graft 05/27/18)                       Personal Care Assistance Level of Assistance  Bathing, Dressing, Total care Bathing Assistance: Maximum assistance Feeding assistance: Independent Dressing Assistance: Maximum assistance Total Care Assistance: Maximum assistance   Functional Limitations Info  Sight Sight Info: Adequate        SPECIAL CARE FACTORS FREQUENCY  PT (By licensed PT), OT (By licensed OT)     PT Frequency: 5x/weekly OT Frequency: min 3x/weekly            Contractures Contractures Info: Not present    Additional Factors Info    Code Status Info: Full Code  Current Medications (09/13/2018):  This is the current hospital active medication list Current Facility-Administered Medications  Medication Dose Route Frequency Provider Last Rate Last Dose  . acetaminophen (TYLENOL) tablet 650 mg  650 mg Oral Q6H PRN Oswald Hillock, MD   650 mg at 09/12/18 0510   Or  . acetaminophen (TYLENOL) suppository 650 mg  650 mg Rectal Q6H PRN Oswald Hillock, MD      . albuterol (PROVENTIL) (2.5 MG/3ML) 0.083% nebulizer solution 2.5 mg  2.5 mg Nebulization Q6H PRN Oswald Hillock, MD   2.5 mg at 09/12/18 2103  . allopurinol (ZYLOPRIM) tablet 100 mg  100 mg Oral Daily Oswald Hillock, MD   100 mg at 09/12/18 0946  . aspirin EC tablet 81 mg  81 mg Oral QHS Oswald Hillock, MD   81 mg at 09/12/18 2219  . carvedilol (COREG) tablet 25 mg  25 mg Oral Q breakfast Oswald Hillock, MD   25 mg at 09/12/18 0946  . colchicine tablet 0.6 mg  0.6 mg Oral Daily Oswald Hillock, MD   0.6 mg at 09/12/18 0945  . collagenase (SANTYL) ointment   Topical Daily [name], MD      . enoxaparin (LOVENOX) injection 80 mg  80 mg Subcutaneous Q24H [place] Eric J, DO   80 mg at 09/12/18 2683  . famotidine (PEPCID) tablet 20 mg  20 mg Oral BID Oswald Hillock, MD   20 mg at 09/12/18 2220  . fluticasone furoate-vilanterol (BREO ELLIPTA) 200-25 MCG/INH 1 puff  1 puff Inhalation Daily Oswald Hillock, MD   1 puff at 09/13/18 0813  . folic acid (FOLVITE) tablet 0.5 mg  500 mcg Oral QHS Darrick Meigs, Gagan S, MD   0.5 mg at 09/12/18 2220  . furosemide (LASIX) tablet 120 mg  120 mg Oral Q breakfast Oswald Hillock, MD   120 mg at 09/12/18 0945  . furosemide (LASIX) tablet 80 mg  80 mg Oral q1800 Lenore Cordia, MD   80 mg at 09/12/18 1652  . gabapentin (NEURONTIN) capsule 100 mg  100 mg Oral TID Oswald Hillock, MD   100 mg at 09/12/18 2220  . hydrOXYzine (ATARAX/VISTARIL) tablet 25 mg  25 mg Oral Q6H PRN Oswald Hillock, MD      . insulin aspart (novoLOG) injection 0-9 Units  0-9 Units Subcutaneous TID WC Oswald Hillock, MD   3 Units at 09/12/18 1652  . montelukast (SINGULAIR) tablet 10 mg  10 mg Oral QHS Oswald Hillock, MD   10 mg at 09/12/18 2219  . Muscle Rub CREA   Topical BID PRN Rayburn, Neta Mends, PA-C      . ondansetron (ZOFRAN) tablet 4 mg  4 mg Oral Q6H PRN Oswald Hillock, MD       Or  . ondansetron (ZOFRAN) injection 4 mg  4 mg Intravenous Q6H PRN Oswald Hillock, MD      . Oxcarbazepine  (TRILEPTAL) tablet 300 mg  300 mg Oral BID Oswald Hillock, MD   300 mg at 09/12/18 2231  . oxyCODONE (Oxy IR/ROXICODONE) immediate release tablet 5 mg  5 mg Oral Q6H PRN [place] Eric J, DO   5 mg at 09/12/18 2230  . predniSONE (DELTASONE) tablet 40 mg  40 mg Oral Q breakfast [place] Eric J, DO   40 mg at 09/12/18 0946  . simvastatin (ZOCOR) tablet 40 mg  40 mg Oral QHS Oswald Hillock, MD  40 mg at 09/12/18 2231  . spironolactone (ALDACTONE) tablet 25 mg  25 mg Oral Daily Oswald Hillock, MD   25 mg at 09/12/18 9692     Discharge Medications: Please see discharge summary for a list of discharge medications.  Relevant Imaging Results:  Relevant Lab Results:   Additional Information SS# [id number]  Ninfa Meeker, RN

## 2018-09-13 NOTE — Plan of Care (Signed)

## 2018-09-13 NOTE — Progress Notes (Addendum)
Inpatient Rehabilitation Admissions Coordinator  [facility] Medicare has denied approval to admit to inpt rehab. They state she lacks the medical neccesity for an in hospital rehab stay. They recommend SNF. I contacted Dr. [place] to offer peer to peer. Inpt rehab will not pursue peer to peer at this time. I met with patient at bedside and she is aware. She would like to pursue SNF rehab at [facility] SNF for which she contacted Claiborne Billings there yesterday. I contacted Ricki Miller RN CM of pt's request. We will sign off at this time.  Danne Baxter, RN, MSN Rehab Admissions Coordinator [phone] 09/13/2018 8:10 AM

## 2018-09-14 LAB — GLUCOSE, CAPILLARY
Glucose-Capillary: 196 mg/dL — ABNORMAL HIGH (ref 70–99)
Glucose-Capillary: 130 mg/dL — ABNORMAL HIGH (ref 70–99)
Glucose-Capillary: 126 mg/dL — ABNORMAL HIGH (ref 70–99)
Glucose-Capillary: 195 mg/dL — ABNORMAL HIGH (ref 70–99)
Glucose-Capillary: 208 mg/dL — ABNORMAL HIGH (ref 70–99)

## 2018-09-14 LAB — CREATININE, SERUM
Creatinine, Ser: 1.5 mg/dL — ABNORMAL HIGH (ref 0.44–1.00)
GFR calc Af Amer: 41 mL/min — ABNORMAL LOW (ref >60)
GFR calc non Af Amer: 35 mL/min — ABNORMAL LOW (ref >60)

## 2018-09-14 LAB — NOVEL CORONAVIRUS, NAA (HOSP ORDER, SEND-OUT TO REF LAB; TAT 18-24 HRS): SARS-CoV-2, NAA: NOT DETECTED

## 2018-09-14 NOTE — TOC Transition Note (Signed)
Transition of Care [facility]) - CM/SW Discharge Note   Patient Details  Name: April [name] MRN: [id number] Date of Birth: [birth year]  Transition of [facility]) CM/SW Contact:  Ninfa Meeker, RN Phone Number: [phone] (working remotely) 09/14/2018, 1:00 PM ,   Clinical Narrative: Pt is a 68 y/o female admitted secondary to worsening LLE and L foot pain. Case manager spoke with Amy at [facility], they are able to accept patient. Case manager explained to patient why she couldn't go to the first choice [facility] and she was disappointed but is agreeable to go to [facility]. Patient says she has been there before.       Final next level of care: Skilled Nursing Facility Barriers to Discharge: (needed facility with Barriatric Bed/hoyer)   Patient Goals and CMS Choice Patient states their goals for this hospitalization and ongoing recovery are:: to recover CMS Medicare.gov Compare Post Acute Care list provided to:: Patient Choice offered to / list presented to : Patient  Discharge Placement - [facility]                       Discharge Plan and Services In-house Referral: Clinical Social Work Discharge Planning Services: CM Consult Post Acute Care Choice: [place]          DME Arranged: N/A DME Agency: NA       HH Arranged: NA HH Agency: NA        Social Determinants of Health (SDOH) Interventions     Readmission Risk Interventions No flowsheet data found.

## 2018-09-14 NOTE — Progress Notes (Signed)
Physical Therapy Treatment Patient Details Name: April [name] MRN: [id number] DOB: [birth year] Today's Date: 09/14/2018    History of Present Illness Pt is a 68 y/o female admitted secondary to worsening LLE and L foot pain. Pt with RLE pain at baseline. Imaging revealed L calcaneous osteomyelitis. Pt also with likely L achilles strain vs partial tear. PMH includes obesity, CAD, asthma, HTN, and DM.     PT Comments    Pt remains very limited secondary to pain in bilateral LEs. She demonstrates very poor tolerance to OOB mobility and is limited to transfers with RW and min A. Pt would continue to benefit from skilled physical therapy services at this time while admitted and after d/c to address the below listed limitations in order to improve overall safety and independence with functional mobility.    Follow Up Recommendations  SNF     Equipment Recommendations  Other (comment)(defer to next venue)    Recommendations for Other Services       Precautions / Restrictions Precautions Precautions: Fall Restrictions Weight Bearing Restrictions: Yes Other Position/Activity Restrictions: WBAT bilateral LEs    Mobility  Bed Mobility Overal bed mobility: Needs Assistance Bed Mobility: Supine to Sit     Supine to sit: Min guard     General bed mobility comments: min guard for safety, HOB elevated, use of railing, greatly increased time and effort needed  Transfers Overall transfer level: Needs assistance Equipment used: Rolling walker (2 wheeled) Transfers: Sit to/from [employer] Transfers Sit to Stand: From elevated surface;Min assist Stand pivot transfers: Min assist       General transfer comment: bed in elevated position to assist with power up into standing; pt with very slow, guarded movement, requiring increased time; very limited secondary to pain  Ambulation/Gait             General Gait Details: pt unable secondary to increasing pain   Stairs              Wheelchair Mobility    Modified Rankin (Stroke Patients Only)       Balance Overall balance assessment: Needs assistance Sitting-balance support: No upper extremity supported;Feet supported Sitting balance-Leahy Scale: Good     Standing balance support: Bilateral upper extremity supported Standing balance-Leahy Scale: Poor                              Cognition Arousal/Alertness: Awake/alert Behavior During Therapy: WFL for tasks assessed/performed Overall Cognitive Status: Within Functional Limits for tasks assessed                                        Exercises      General Comments        Pertinent Vitals/Pain Pain Assessment: Faces Faces Pain Scale: Hurts whole lot Pain Location: bilateral knees Pain Descriptors / Indicators: Grimacing;Guarding;Aching Pain Intervention(s): Monitored during session;Repositioned    Home Living                      Prior Function            PT Goals (current goals can now be found in the care plan section) Acute Rehab PT Goals PT Goal Formulation: With patient Time For Goal Achievement: 09/22/18 Potential to Achieve Goals: Good Progress towards PT goals: Progressing toward goals    Frequency  Min 3X/week      PT Plan Current plan remains appropriate    Co-evaluation              AM-PAC PT "6 Clicks" Mobility   Outcome Measure  Help needed turning from your back to your side while in a flat bed without using bedrails?: A Little Help needed moving from lying on your back to sitting on the side of a flat bed without using bedrails?: A Little Help needed moving to and from a bed to a chair (including a wheelchair)?: A Little Help needed standing up from a chair using your arms (e.g., wheelchair or bedside chair)?: A Lot Help needed to walk in hospital room?: Total Help needed climbing 3-5 steps with a railing? : Total 6 Click Score: 13    End of  Session Equipment Utilized During Treatment: Gait belt Activity Tolerance: Patient limited by pain Patient left: in chair;with call bell/phone within reach Nurse Communication: Mobility status PT Visit Diagnosis: Difficulty in walking, not elsewhere classified (R26.2);Pain;Muscle weakness (generalized) (M62.81) Pain - Right/Left: (bilateral) Pain - part of body: Knee     Time: 6770-3403 PT Time Calculation (min) (ACUTE ONLY): 22 min  Charges:  $Therapeutic Activity: 8-22 mins                     Sherie Don, PT, DPT  Acute Rehabilitation Services Pager [phone] Office [place] 09/14/2018, 12:17 PM

## 2018-09-14 NOTE — Progress Notes (Signed)
PROGRESS NOTE    April [name]  HYQ:[id number] DOB: 1950-07-08 DOA: 09/07/2018 PCP: [name], MD    Brief Narrative:    68 y.o. aa female,  asthma, CAD, type 2 diabetes mellitus, morbid obesity, chronic right lower extremity wound and is being followed by Dr. [name] hypertension  Admitted with right >left side pain in the heel   She gets wound care nurses provide wound care 3 times a week.    In the ED x-ray of the left ankle showed possible osteomyelitis involving left calcaneus.  Patient started on IV antibiotics.  Orthopedics was consulted by ED physician and they recommended patient to be transferred to [place]:   Active Problems:   Hyperlipidemia   Morbid (severe) obesity due to excess calories (HCC)   Essential hypertension   Bilateral leg edema   CKD (chronic kidney disease) stage 3, GFR 30-59 ml/min (HCC)   Diabetic peripheral neuropathy (HCC)   Osteomyelitis of ankle or foot, acute, left (HCC)   Osteomyelitis of ankle or foot, left, acute (HCC)   Achilles tendon sprain, left, initial encounter   Left calcaneus lytic changes l ACHILLES TEAR  was initially started on broad-spectrum antibiotics with vancomycin and aztreonam. Patient was evaluated by her primary orthopedist, Dr. [name]; who does not believe that there is any clinical indication of osteomyelitis of the left calcaneus.  Recommends physical therapy for ambulation and weightbearing as tolerated.  Silvadene/Santyl dressing changes and treatment to her right lower extremity wound.  Unfortunately due to patient's body habitus a fracture boot unable to be fitted, which may have helped her with likely Achilles tear. --Discontinued antibiotics 8/6. --Pending SNF, accepted at [facility], repeat COVID-19 ordered 8/11 --Dr. [name] recommends outpatient follow-up  Acute gout flare Patient complaining of multiple joint pains to include bilateral knees and left ankle.   Uric acid  12.5-->11.0. --started prednisone 40mg  PO daily 8/9; will start slow taper once symptoms improve  Type 2 diabetes mellitus Diet controlled at home.  Hemoglobin A1c 6.0. --Consistent carbohydrate diet --NovoLog insulin sliding scale for coverage if needed  CKD stage III Creatinine over the past year ranging from 1.32 to 2.45.  Creatinine on admission 2.04, appears to be within her baseline. --Cr 2.04-->1.76-->1.50-->1.57-->1.47 --Avoid nephrotoxins, renally dose all medications  Chronic right leg ulceration Patient follows with wound care at home. --Wound care consulted, continue Santyl with dry gauze secured with Ace bandage with daily changes --WOUD  TOP BE REVIEWED IN AM  Essential hypertension Blood pressure well controlled.  Continue carvedilol, spironolactone  COPD/ASTHMA Not in acute exacerbation.  Continue montelukast, Dulera, as needed albuterol.  Chronic peripheral neuropathy secondary likely to underlying diabetes and lumbago Continue home gabapentin 100 mg p.o. TID and Trileptal 300mg  PO BID.  Morbid obesity BMI 60.44.  Discussed with patient that the majority of her chronic medical conditions points back to her underlying severe morbid obesity.  Discussed with her needs aggressive weight loss measures as this complicates all facets of care.  DVT prophylaxis: Lovenox Code Status: Full code Family Communication: None Disposition Plan: Continue inpatient, pending transfer to [facility] SNF probably on 8/13  Consultants:   Orthopedics, Dr. [name]  Procedures:   None  Antimicrobials:   Vancomycin 8/5 - 8/6  Aztreonam 8/5 - 8/6   Subjective:  Patient in some amount of pain able however to move toes-thinks that her neuropathy is what is causing the pain in her foot she does have a bruise on  the left plantar surface of her left leg No other real issues at this time other than some wheezing and cough overnight which was helped by inhaler treatments  Objective:  Vitals:   09/13/18 1549 09/13/18 1959 09/14/18 0354 09/14/18 0843  BP: (!) 121/54 (!) 119/51 130/64   Pulse: 71 83 68 72  Resp: 16 18 17 18   Temp: 99.3 F (37.4 C) 98.7 F (37.1 C) 98.2 F (36.8 C)   TempSrc: Oral Oral Oral   SpO2: 94% 95% 93% 93%  Weight:      Height:        Intake/Output Summary (Last 24 hours) at 09/14/2018 0936 Last data filed at 09/14/2018 0355 Gross per 24 hour  Intake 960 ml  Output 2900 ml  Net -1940 ml   Filed Weights   09/07/18 2339 09/08/18 0235  Weight: (!) 159.8 kg (!) 159.8 kg    Examination:  Morbidly obese pleasant African-American female no distress Mallampati 4 EOMI NCAT Chest clinically clear Abdomen soft obese nontender postsurgical changes Lower extremity edema is present right leg is wrapped Left foot has a bruise over the lower part of the foot   Data Reviewed: I have personally reviewed following labs and imaging studies  CBC: Recent Labs  Lab 09/07/18 1530 09/08/18 0020 09/13/18 0305  WBC 4.1 4.0 5.3  NEUTROABS 2.1  --   --   HGB 10.7* 10.3* 10.3*  HCT 35.6* 33.3* 32.9*  MCV 96.5 93.5 91.6  PLT 159 134* 149   Basic Metabolic Panel: Recent Labs  Lab 09/07/18 1530 09/08/18 0020 09/11/18 0649 09/12/18 0451 09/13/18 0305 09/14/18 0335  NA 141  --  138 138 139  --   K 4.2  --  3.4* 3.8 3.8  --   CL 101  --  96* 97* 97*  --   CO2 30  --  31 31 32  --   GLUCOSE 103*  --  99 124* 133*  --   BUN 28*  --  22 24* 30*  --   CREATININE 2.04* 1.76* 1.50* 1.57* 1.47* 1.50*  CALCIUM 9.2  --  8.4* 8.8* 8.7*  --   MG  --   --   --  2.1  --   --    GFR: Estimated Creatinine Clearance: 54.8 mL/min (A) (by C-G formula based on SCr of 1.5 mg/dL (H)). Liver Function Tests: No results for input(s): AST, ALT, ALKPHOS, BILITOT, PROT, ALBUMIN in the last 168 hours. No results for input(s): LIPASE, AMYLASE in the last 168 hours. No results for input(s): AMMONIA in the last 168 hours. Coagulation Profile: No results for  input(s): INR, PROTIME in the last 168 hours. Cardiac Enzymes: No results for input(s): CKTOTAL, CKMB, CKMBINDEX, TROPONINI in the last 168 hours. BNP (last 3 results) No results for input(s): PROBNP in the last 8760 hours. HbA1C: No results for input(s): HGBA1C in the last 72 hours. CBG: Recent Labs  Lab 09/12/18 2141 09/13/18 0735 09/13/18 1153 09/13/18 1624 09/13/18 2136  GLUCAP 167* 107* 129* 238* 141*   Lipid Profile: No results for input(s): CHOL, HDL, LDLCALC, TRIG, CHOLHDL, LDLDIRECT in the last 72 hours. Thyroid Function Tests: No results for input(s): TSH, T4TOTAL, FREET4, T3FREE, THYROIDAB in the last 72 hours. Anemia Panel: No results for input(s): VITAMINB12, FOLATE, FERRITIN, TIBC, IRON, RETICCTPCT in the last 72 hours. Sepsis Labs: No results for input(s): PROCALCITON, LATICACIDVEN in the last 168 hours.  Recent Results (from the past 240 hour(s))  SARS Coronavirus 2 (  Hospital order, Performed in [facility] hospital lab) Nasopharyngeal Nasopharyngeal Swab     Status: None   Collection Time: 09/07/18  5:06 PM   Specimen: Nasopharyngeal Swab  Result Value Ref Range Status   SARS Coronavirus 2 NEGATIVE NEGATIVE Final    Comment: (NOTE) If result is NEGATIVE SARS-CoV-2 target nucleic acids are NOT DETECTED. The SARS-CoV-2 RNA is generally detectable in upper and lower  respiratory specimens during the acute phase of infection. The lowest  concentration of SARS-CoV-2 viral copies this assay can detect is 250  copies / mL. A negative result does not preclude SARS-CoV-2 infection  and should not be used as the sole basis for treatment or other  patient management decisions.  A negative result may occur with  improper specimen collection / handling, submission of specimen other  than nasopharyngeal swab, presence of viral mutation(s) within the  areas targeted by this assay, and inadequate number of viral copies  (<250 copies / mL). A negative result must be  combined with clinical  observations, patient history, and epidemiological information. If result is POSITIVE SARS-CoV-2 target nucleic acids are DETECTED. The SARS-CoV-2 RNA is generally detectable in upper and lower  respiratory specimens dur ing the acute phase of infection.  Positive  results are indicative of active infection with SARS-CoV-2.  Clinical  correlation with patient history and other diagnostic information is  necessary to determine patient infection status.  Positive results do  not rule out bacterial infection or co-infection with other viruses. If result is PRESUMPTIVE POSTIVE SARS-CoV-2 nucleic acids MAY BE PRESENT.   A presumptive positive result was obtained on the submitted specimen  and confirmed on repeat testing.  While 2019 novel coronavirus  (SARS-CoV-2) nucleic acids may be present in the submitted sample  additional confirmatory testing may be necessary for epidemiological  and / or clinical management purposes  to differentiate between  SARS-CoV-2 and other Sarbecovirus currently known to infect humans.  If clinically indicated additional testing with an alternate test  methodology [phone]) is advised. The SARS-CoV-2 RNA is generally  detectable in upper and lower respiratory sp ecimens during the acute  phase of infection. The expected result is Negative. Fact Sheet for Patients:  StrictlyIdeas.no Fact Sheet for Healthcare Providers: BankingDealers.co.za This test is not yet approved or cleared by the [place] FDA and has been authorized for detection and/or diagnosis of SARS-CoV-2 by FDA under an Emergency Use Authorization (EUA).  This EUA will remain in effect (meaning this test can be used) for the duration of the COVID-19 declaration under Section 564(b)(1) of the Act, 21 U.S.C. section 360bbb-3(b)(1), unless the authorization is terminated or revoked sooner. Performed at [facility], [place] [address], [place], [place] [address]          Radiology Studies: No results found.      Scheduled Meds: . allopurinol  100 mg Oral Daily  . aspirin EC  81 mg Oral QHS  . carvedilol  25 mg Oral Q breakfast  . colchicine  0.6 mg Oral Daily  . collagenase   Topical Daily  . enoxaparin (LOVENOX) injection  80 mg Subcutaneous Q24H  . famotidine  20 mg Oral BID  . fluticasone furoate-vilanterol  1 puff Inhalation Daily  . folic acid  347 mcg Oral QHS  . furosemide  120 mg Oral Q breakfast  . furosemide  80 mg Oral q1800  . gabapentin  100 mg Oral TID  . insulin aspart  0-9 Units Subcutaneous TID  WC  . montelukast  10 mg Oral QHS  . Oxcarbazepine  300 mg Oral BID  . predniSONE  40 mg Oral Q breakfast  . simvastatin  40 mg Oral QHS  . spironolactone  25 mg Oral Daily   Continuous Infusions:    LOS: 2 days   Verneita Griffes, MD [facility] 9:40 AM  If 7PM-7AM, please contact night-coverage [email or web address] Password [facility] 09/14/2018, 9:36 AM

## 2018-09-14 NOTE — Care Management (Signed)
Case manager received call from [place] [facility], stating they are not able to accept patient due to her weight of 350 lbs, they do not have bariatric beds and staffing is limited. Case manager explained this to patient who is definitely disappointed. Case manager asked if she was agreeable to being faxed out to other facilities, patient says she does not want to go to a "below standard" facility. Said to fax to [facility] and [facility]. CM discussed possibility of her going home with resumption of Home Health with [facility]. Patient said her husband and son are gone all day for work and she is alone, would need some assistance through the day. CM will contact [facility] also. Will continue to monitor.    Ricki Miller, RN BSN Case Manager [phone]

## 2018-09-15 LAB — CBC WITH DIFFERENTIAL/PLATELET
Abs Immature Granulocytes: 0.03 10*3/uL (ref 0.00–0.07)
Basophils Absolute: 0.1 10*3/uL (ref 0.0–0.1)
Basophils Relative: 1 %
Eosinophils Absolute: 0.1 10*3/uL (ref 0.0–0.5)
Eosinophils Relative: 1 %
HCT: 36.5 % (ref 36.0–46.0)
Hemoglobin: 11.3 g/dL — ABNORMAL LOW (ref 12.0–15.0)
Immature Granulocytes: 1 %
Lymphocytes Relative: 23 %
Lymphs Abs: 1.4 10*3/uL (ref 0.7–4.0)
MCH: 29.4 pg (ref 26.0–34.0)
MCHC: 31 g/dL (ref 30.0–36.0)
MCV: 95.1 fL (ref 80.0–100.0)
Monocytes Absolute: 0.5 10*3/uL (ref 0.1–1.0)
Monocytes Relative: 9 %
Neutro Abs: 3.8 10*3/uL (ref 1.7–7.7)
Neutrophils Relative %: 65 %
Platelets: 190 10*3/uL (ref 150–400)
RBC: 3.84 MIL/uL — ABNORMAL LOW (ref 3.87–5.11)
RDW: 14.9 % (ref 11.5–15.5)
WBC: 5.9 10*3/uL (ref 4.0–10.5)
nRBC: 0 % (ref 0.0–0.2)

## 2018-09-15 LAB — RENAL FUNCTION PANEL
Albumin: 2.8 g/dL — ABNORMAL LOW (ref 3.5–5.0)
Anion gap: 10 (ref 5–15)
BUN: 34 mg/dL — ABNORMAL HIGH (ref 8–23)
CO2: 31 mmol/L (ref 22–32)
Calcium: 8.6 mg/dL — ABNORMAL LOW (ref 8.9–10.3)
Chloride: 99 mmol/L (ref 98–111)
Creatinine, Ser: 1.47 mg/dL — ABNORMAL HIGH (ref 0.44–1.00)
GFR calc Af Amer: 42 mL/min — ABNORMAL LOW (ref >60)
GFR calc non Af Amer: 36 mL/min — ABNORMAL LOW (ref >60)
Glucose, Bld: 122 mg/dL — ABNORMAL HIGH (ref 70–99)
Phosphorus: 4.5 mg/dL (ref 2.5–4.6)
Potassium: 3.9 mmol/L (ref 3.5–5.1)
Sodium: 140 mmol/L (ref 135–145)

## 2018-09-15 LAB — GLUCOSE, CAPILLARY
Glucose-Capillary: 121 mg/dL — ABNORMAL HIGH (ref 70–99)
Glucose-Capillary: 170 mg/dL — ABNORMAL HIGH (ref 70–99)

## 2018-09-15 MED ORDER — OXYCODONE-ACETAMINOPHEN 5-325 MG PO TABS: 1.0000 | ORAL_TABLET | Freq: Four times a day (QID) | ORAL | 0 refills | Status: DC | PRN

## 2018-09-15 MED ORDER — AQUAPHOR EX OINT: TOPICAL_OINTMENT | CUTANEOUS | 0 refills | Status: DC | PRN

## 2018-09-15 MED ORDER — PREDNISONE 20 MG PO TABS: ORAL_TABLET | ORAL | 0 refills | Status: AC

## 2018-09-15 MED ORDER — COLLAGENASE 250 UNIT/GM EX OINT: TOPICAL_OINTMENT | Freq: Every day | CUTANEOUS | 0 refills | Status: DC

## 2018-09-15 NOTE — Discharge Summary (Signed)
Physician Discharge Summary  April [name] CHY:[id number] DOB: 03/24/1950 DOA: 09/07/2018  PCP: [name], MD  Admit date: 09/07/2018 Discharge date: 09/15/2018  Time spent: 45 minutes  Recommendations for Outpatient Follow-up:  1. Recommend addition of Aquaphor to low-dose lower extremities to ensure moistness 2. Consider de-escalation off of montelukast-recent black box warnings associated with suicidality 3. Recommend Chem-12, CBC 1 week 4. Suggest follow-up with Dr. [name] of orthopedics-suggest right foot x-ray to rule out occult fracture of calcaneus-patient does have Achilles tear on the left side 5. Recommend weightbearing as tolerated-do not escalate gabapentin given side effects in the past 6. Recommend consideration of down titration off of Lasix I have resumed it on discharge but may need lower dose if kidney function is abnormal in the next several days   Discharge Diagnoses:  Active Problems:   Hyperlipidemia   Morbid (severe) obesity due to excess calories (HCC)   Essential hypertension   Bilateral leg edema   CKD (chronic kidney disease) stage 3, GFR 30-59 ml/min (HCC)   Diabetic peripheral neuropathy (HCC)   Osteomyelitis of ankle or foot, acute, left (HCC)   Osteomyelitis of ankle or foot, left, acute (HCC)   Achilles tendon sprain, left, initial encounter   Discharge Condition: Improved  Diet recommendation: Heart healthy low-salt  Filed Weights   09/07/18 2339 09/08/18 0235  Weight: (!) 159.8 kg (!) 159.8 kg    History of present illness:  68 y.o. aafemale, asthma, CAD, type 2 diabetes mellitus, morbid obesity, chronic right lower extremity wound and is being followed by Dr. [name] hypertension  Admitted with right >left side pain in the heel  She gets wound care nurses provide wound care 3 times a week.    In the ED x-ray of the left ankle showed possible osteomyelitis involving left calcaneus. Patient started on IV antibiotics. Orthopedics was  consulted by ED physician and they recommended patient to be transferred to [facility] Course:   Left calcaneus lytic changes l ACHILLES TEAR  was initially started on broad-spectrum antibiotics with vancomycin and aztreonam. Patient was evaluated by her primary orthopedist, Dr. [name]; who does not believe that there is any clinical indication of osteomyelitis of the left calcaneus.  Recommends physical therapy for ambulation and weightbearing as tolerated.   Silvadene/Santyl dressing changes and treatment to her right lower extremity wound.  Unfortunately due to patient's body habitus a fracture boot unable to be fitted, which may have helped her with likely Achilles tear. --Discontinued antibiotics 8/6. --Dr. [name] recommends outpatient follow-up and I recommend probably recheck of x-ray if severe pain  Acute gout flare-presumed She is on colchicine and uric acid but has not ever been formally diagnosed per her?  She may need crystal analysis on synovial fluid in the outpatient setting per Dr. [name] Patient complaining of multiple joint pains to include bilateral knees and left ankle.   Uric acid 12.5-->11.0. --started prednisone 40mg  PO daily 8/9 tapering doses prescribed on discharge as per [facility]  Type 2 diabetes mellitus-complications of neuropathy in lower extremities with occasional pain at rest Diet controlled at home.  Hemoglobin A1c 6.0. --Consistent carbohydrate diet --NovoLog insulin sliding scale for coverage if needed --Cannot escalate gabapentin because of complications in the past of confusion with this medication  CKD stage III Creatinine over the past year ranging from 1.32 to 2.45.  Creatinine on admission 2.04, appears to be within her baseline. --Cr 2.04-->1.76-->1.50-->1.57-->1.47 --Avoid nephrotoxins, renally dose all medications Note that Lasix 120/p.m. 82  mg dosing prescribed on discharge may need to have this readjusted in the outpatient  setting  Chronic right leg ulceration Patient follows with wound care at home. --Wound care consulted, continue Santyl with dry gauze secured with Ace bandage with daily changes --        Essential hypertension Blood pressure well controlled.  Continue carvedilol, spironolactone-  COPD/ASTHMA Not in acute exacerbation.  Continue montelukast, Dulera, as needed albuterol.  Chronic peripheral neuropathy secondary likely to underlying diabetes and lumbago Continue home gabapentin 100 mg p.o. TID and Trileptal 300mg  PO BID.  Morbid obesity BMI 60.44.  Discussed with patient that the majority of her chronic medical conditions points back to her underlying severe morbid obesity.  Discussed with her needs aggressive weight loss measures as this complicates all facets of care.  Procedures:  CT of lower extremity  Consultations:  Dr. [name] of orthopedics  Discharge Exam: Vitals:   09/15/18 0737 09/15/18 0911  BP:  (!) 112/55  Pulse: 78 75  Resp: 18 17  Temp:  98.7 F (37.1 C)  SpO2: 96% 95%    General: Awake alert coherent no distress happy Cardiovascular: S1-S2 no murmur rub or gallop Respiratory: Chest clear Wounds as above Neurologically intact  Discharge Instructions   Discharge Instructions    Diet - low sodium heart healthy   Complete by: As directed    Increase activity slowly   Complete by: As directed      Allergies as of 09/15/2018      Reactions   Penicillins Rash, Other (See Comments)   She was told not to take it anymore. Has patient had a PCN reaction causing immediate rash, facial/tongue/throat swelling, SOB or lightheadedness with hypotension: Yes Has patient had a PCN reaction causing severe rash involving mucus membranes or skin necrosis: No Has patient had a PCN reaction that required hospitalization: No Has patient had a PCN reaction occurring within the last 10 years: No If all of the above answers are "NO", then may proceed with  Cephalosporin use.'   Shellfish Allergy Anaphylaxis   Sulfonamide Derivatives Anaphylaxis, Other (See Comments)   REACTION: internal "burning"   Hydrochlorothiazide W-triamterene Other (See Comments)   Hypokalemia   Lotensin [benazepril Hcl] Hives   Dipyridamole Other (See Comments)   Unknown reaction   Estrogens Other (See Comments)   Unknown reaction   Hydrochlorothiazide Other (See Comments)   Unknown reaction   Metronidazole Other (See Comments)   Unknown reaction   Other    Pickles-itching   Spironolactone Other (See Comments)   UNSPECIFIED > "kidney problems"   Sulfa Antibiotics Other (See Comments)   Unknown reaction   Torsemide Other (See Comments)   Unknown reaction   Valsartan Other (See Comments)   Unknown reaction   Mustard [allyl Isothiocyanate] Itching      Medication List    STOP taking these medications   B-D 3CC LUER-LOK SYR 23GX1-1/2 23G X 1-1/2" 3 ML Misc Generic drug: SYRINGE-NEEDLE (DISP) 3 ML   COD LIVER OIL PO   diphenhydrAMINE 12.5 MG/5ML liquid Commonly known as: BENADRYL   docusate sodium 100 MG capsule Commonly known as: COLACE   FLAX SEEDS PO   multivitamin with minerals Tabs tablet   onetouch ultrasoft lancets   potassium chloride SA 20 MEQ tablet Commonly known as: K-DUR   SYRINGE-NEEDLE (DISP) 3 ML 23G X 1" 3 ML Misc     TAKE these medications   albuterol (2.5 MG/3ML) 0.083% nebulizer solution Commonly known as: PROVENTIL Take 3  mLs (2.5 mg total) by nebulization every 6 (six) hours as needed for wheezing or shortness of breath. What changed: Another medication with the same name was removed. Continue taking this medication, and follow the directions you see here.   allopurinol 100 MG tablet Commonly known as: ZYLOPRIM Take 1 tablet (100 mg total) by mouth daily.   budesonide-formoterol 160-4.5 MCG/ACT inhaler Commonly known as: Symbicort one - two inhalations every 12 hours; gargle and spit after use What changed:    how much to take  how to take this  when to take this  reasons to take this  additional instructions   calcium carbonate 500 MG chewable tablet Commonly known as: TUMS - dosed in mg elemental calcium Chew 2 tablets by mouth daily as needed for indigestion or heartburn.   carvedilol 25 MG tablet Commonly known as: COREG TAKE 1 TABLET BY MOUTH TWICE DAILY WITH A MEAL What changed: See the new instructions.   colchicine 0.6 MG tablet Take 1 tablet (0.6 mg total) by mouth daily.   collagenase ointment Commonly known as: SANTYL Apply topically daily. Start taking on: September 16, 2018   cyanocobalamin 1000 MCG/ML injection Commonly known as: (VITAMIN B-12) INJECT 1ML INTO MUSCLE EVERY 30 DAYS What changed:   how much to take  how to take this  when to take this  additional instructions   diclofenac sodium 1 % Gel Commonly known as: VOLTAREN Apply 2 g topically 4 (four) times daily. What changed:   when to take this  reasons to take this   Ecotrin Low Strength 81 MG EC tablet Generic drug: aspirin Take 81 mg by mouth at bedtime.   EPINEPHrine 0.3 mg/0.3 mL Soaj injection Commonly known as: EPI-PEN Inject 0.3 mLs (0.3 mg total) into the muscle once as needed for anaphylaxis.   famotidine 20 MG tablet Commonly known as: Pepcid Take 1 tablet (20 mg total) by mouth 2 (two) times daily.   Fish Oil 1000 MG Caps Take 1,000 mg by mouth daily.   folic acid 546 MCG tablet Commonly known as: FOLVITE Take 400 mcg by mouth at bedtime.   furosemide 80 MG tablet Commonly known as: LASIX 1.5 tablets in morning, 1 tablet in afternoon What changed:   how much to take  how to take this  when to take this   gabapentin 100 MG capsule Commonly known as: Neurontin Take 1 capsule (100 mg total) by mouth 3 (three) times daily.   hydrOXYzine 25 MG tablet Commonly known as: ATARAX/VISTARIL Take 25 mg by mouth every 6 (six) hours as needed for itching.   mineral  oil-hydrophilic petrolatum ointment Apply topically as needed for dry skin.   montelukast 10 MG tablet Commonly known as: SINGULAIR Take 1 tablet (10 mg total) by mouth at bedtime.   Oxcarbazepine 300 MG tablet Commonly known as: TRILEPTAL Take 1 tablet (300 mg total) by mouth 2 (two) times daily.   oxyCODONE-acetaminophen 5-325 MG tablet Commonly known as: Percocet Take 1 tablet by mouth every 6 (six) hours as needed for moderate pain or severe pain.   permethrin 5 % cream Commonly known as: ELIMITE Apply 1 application topically as needed. Hives and rash on arms and chest   predniSONE 20 MG tablet Commonly known as: DELTASONE Take 2 tablets (40 mg total) by mouth daily with breakfast for 3 days, THEN 1 tablet (20 mg total) daily with breakfast for 3 days. Start taking on: September 16, 2018   silver sulfADIAZINE 1 % cream  Commonly known as: Silvadene Apply 1 application topically daily.   simvastatin 40 MG tablet Commonly known as: ZOCOR Take 1 tablet (40 mg total) by mouth at bedtime.   spironolactone 25 MG tablet Commonly known as: ALDACTONE TAKE 1 TABLET BY MOUTH ONCE DAILY      Allergies  Allergen Reactions   Penicillins Rash and Other (See Comments)    She was told not to take it anymore. Has patient had a PCN reaction causing immediate rash, facial/tongue/throat swelling, SOB or lightheadedness with hypotension: Yes Has patient had a PCN reaction causing severe rash involving mucus membranes or skin necrosis: No Has patient had a PCN reaction that required hospitalization: No Has patient had a PCN reaction occurring within the last 10 years: No If all of the above answers are "NO", then may proceed with Cephalosporin use.'   Shellfish Allergy Anaphylaxis   Sulfonamide Derivatives Anaphylaxis and Other (See Comments)    REACTION: internal "burning"   Hydrochlorothiazide W-Triamterene Other (See Comments)    Hypokalemia   Lotensin [Benazepril Hcl] Hives    Dipyridamole Other (See Comments)    Unknown reaction   Estrogens Other (See Comments)    Unknown reaction   Hydrochlorothiazide Other (See Comments)    Unknown reaction   Metronidazole Other (See Comments)    Unknown reaction   Other     Pickles-itching   Spironolactone Other (See Comments)    UNSPECIFIED > "kidney problems"   Sulfa Antibiotics Other (See Comments)    Unknown reaction   Torsemide Other (See Comments)    Unknown reaction   Valsartan Other (See Comments)    Unknown reaction   Mustard [Allyl Isothiocyanate] Itching      The results of significant diagnostics from this hospitalization (including imaging, microbiology, ancillary and laboratory) are listed below for reference.    Significant Diagnostic Studies: Dg Chest 2 View  Result Date: 08/26/2018 CLINICAL DATA:  Bilateral leg swelling.  Recent hospitalization. EXAM: CHEST - 2 VIEW COMPARISON:  August 15, 2018 FINDINGS: Cardiomediastinal silhouette is normal. Mediastinal contours appear intact. Calcific atherosclerotic disease of the aorta. There is no evidence of focal airspace consolidation, pleural effusion or pneumothorax. Osseous structures are without acute abnormality. Soft tissues are grossly normal. IMPRESSION: No active cardiopulmonary disease. Electronically Signed   By: Fidela Salisbury M.D.   On: 08/26/2018 16:06   Dg Tibia/fibula Right  Result Date: 09/07/2018 CLINICAL DATA:  Pain and lower extremity swelling EXAM: RIGHT TIBIA AND FIBULA - 2 VIEW COMPARISON:  February 15, 2018 FINDINGS: Again noted is diffuse subcutaneous edema seen surrounding the lower extremity. Surgical staples seen. No fracture or dislocation is seen. Right knee osteoarthritis is noted. IMPRESSION: Diffuse subcutaneous edema.  No acute osseous abnormality. Electronically Signed   By: Prudencio Pair M.D.   On: 09/07/2018 16:19   Dg Ankle 2 Views Left  Result Date: 08/20/2018 CLINICAL DATA:  Acute left ankle pain without  known injury. EXAM: LEFT ANKLE - 2 VIEW COMPARISON:  None. FINDINGS: There is no evidence of fracture, dislocation, or joint effusion. There is no evidence of arthropathy or other focal bone abnormality. Soft tissues are unremarkable. IMPRESSION: Negative. Electronically Signed   By: Marijo Conception M.D.   On: 08/20/2018 15:27   Dg Ankle Complete Left  Result Date: 09/07/2018 CLINICAL DATA:  Chronic bilateral leg wounds. EXAM: LEFT ANKLE COMPLETE - 3+ VIEW COMPARISON:  Left ankle dated 08/20/2018. Left foot radiographs obtained today. FINDINGS: Progressive diffuse soft tissue swelling. Interval ill-defined destruction of the  anterior aspect of the calcaneus inferiorly. Mild talotibial spur formation posteriorly. Moderate-sized inferior calcaneal spur. IMPRESSION: 1. Interval bone destruction suspicious for osteomyelitis involving the anterior aspect of the calcaneus. 2. Progressive diffuse soft tissue swelling. Electronically Signed   By: Claudie Revering M.D.   On: 09/07/2018 16:54   Dg Foot Complete Left  Result Date: 09/07/2018 CLINICAL DATA:  Bilateral chronic leg wounds with pain and difficulty walking. EXAM: LEFT FOOT - COMPLETE 3+ VIEW COMPARISON:  Left ankle radiographs obtained today. Left ankle radiographs dated 08/20/2018. FINDINGS: Again demonstrated is ill-defined bone destruction involving the distal aspect of the calcaneus inferiorly. This was not present on 08/20/2018. Moderate-sized inferior calcaneal spur formation. No soft tissue gas. IMPRESSION: Probable osteomyelitis involving the distal aspect of the calcaneus. Electronically Signed   By: Claudie Revering M.D.   On: 09/07/2018 16:56    Microbiology: Recent Results (from the past 240 hour(s))  SARS Coronavirus 2 [facility] order, Performed in [facility] hospital lab) Nasopharyngeal Nasopharyngeal Swab     Status: None   Collection Time: 09/07/18  5:06 PM   Specimen: Nasopharyngeal Swab  Result Value Ref Range Status   SARS Coronavirus 2  NEGATIVE NEGATIVE Final    Comment: (NOTE) If result is NEGATIVE SARS-CoV-2 target nucleic acids are NOT DETECTED. The SARS-CoV-2 RNA is generally detectable in upper and lower  respiratory specimens during the acute phase of infection. The lowest  concentration of SARS-CoV-2 viral copies this assay can detect is 250  copies / mL. A negative result does not preclude SARS-CoV-2 infection  and should not be used as the sole basis for treatment or other  patient management decisions.  A negative result may occur with  improper specimen collection / handling, submission of specimen other  than nasopharyngeal swab, presence of viral mutation(s) within the  areas targeted by this assay, and inadequate number of viral copies  (<250 copies / mL). A negative result must be combined with clinical  observations, patient history, and epidemiological information. If result is POSITIVE SARS-CoV-2 target nucleic acids are DETECTED. The SARS-CoV-2 RNA is generally detectable in upper and lower  respiratory specimens dur ing the acute phase of infection.  Positive  results are indicative of active infection with SARS-CoV-2.  Clinical  correlation with patient history and other diagnostic information is  necessary to determine patient infection status.  Positive results do  not rule out bacterial infection or co-infection with other viruses. If result is PRESUMPTIVE POSTIVE SARS-CoV-2 nucleic acids MAY BE PRESENT.   A presumptive positive result was obtained on the submitted specimen  and confirmed on repeat testing.  While 2019 novel coronavirus  (SARS-CoV-2) nucleic acids may be present in the submitted sample  additional confirmatory testing may be necessary for epidemiological  and / or clinical management purposes  to differentiate between  SARS-CoV-2 and other Sarbecovirus currently known to infect humans.  If clinically indicated additional testing with an alternate test  methodology [id number])  is advised. The SARS-CoV-2 RNA is generally  detectable in upper and lower respiratory sp ecimens during the acute  phase of infection. The expected result is Negative. Fact Sheet for Patients:  StrictlyIdeas.no Fact Sheet for Healthcare Providers: BankingDealers.co.za This test is not yet approved or cleared by the [place] FDA and has been authorized for detection and/or diagnosis of SARS-CoV-2 by FDA under an Emergency Use Authorization (EUA).  This EUA will remain in effect (meaning this test can be used) for the duration of the COVID-19 declaration under Section  564(b)(1) of the Act, 21 U.S.C. section 360bbb-3(b)(1), unless the authorization is terminated or revoked sooner. Performed at [facility], [place] [address] [place], [place] [address]   Novel Coronavirus, NAA (hospital order; send-out to ref lab)     Status: None   Collection Time: 09/13/18 11:53 AM   Specimen: Nasopharyngeal Swab; Respiratory  Result Value Ref Range Status   SARS-CoV-2, NAA NOT DETECTED NOT DETECTED Final    Comment: (NOTE) This test was developed and its performance characteristics determined by [employer]. This test has not been FDA cleared or approved. This test has been authorized by FDA under an Emergency Use Authorization (EUA). This test is only authorized for the duration of time the declaration that circumstances exist justifying the authorization of the emergency use of in vitro diagnostic tests for detection of SARS-CoV-2 virus and/or diagnosis of COVID-19 infection under section 564(b)(1) of the Act, 21 U.S.C. 734LPF-7(T)(0), unless the authorization is terminated or revoked sooner. When diagnostic testing is negative, the possibility of a false negative result should be considered in the context of a patient's recent exposures and the presence of clinical signs and symptoms consistent with COVID-19. An  individual without symptoms of COVID-19 and who is not shedding SARS-CoV-2 virus would expect to have a negative (not detected) result in this assay. Performed  At: [facility] [address] [place], [place] [id number] Rush Farmer MD DJ:[id number]    [place]  Final    Comment: Performed at [facility], [place] [address] [place], [place] [address]     Labs: Basic Metabolic Panel: Recent Labs  Lab 09/11/18 [phone] 09/12/18 0451 09/13/18 0305 09/14/18 0335 09/15/18 0413  NA 138 138 139  --  140  K 3.4* 3.8 3.8  --  3.9  CL 96* 97* 97*  --  99  CO2 31 31 32  --  31  GLUCOSE 99 124* 133*  --  122*  BUN 22 24* 30*  --  34*  CREATININE 1.50* 1.57* 1.47* 1.50* 1.47*  CALCIUM 8.4* 8.8* 8.7*  --  8.6*  MG  --  2.1  --   --   --   PHOS  --   --   --   --  4.5   Liver Function Tests: Recent Labs  Lab 09/15/18 0413  ALBUMIN 2.8*   No results for input(s): LIPASE, AMYLASE in the last 168 hours. No results for input(s): AMMONIA in the last 168 hours. CBC: Recent Labs  Lab 09/13/18 0305 09/15/18 0413  WBC 5.3 5.9  NEUTROABS  --  3.8  HGB 10.3* 11.3*  HCT 32.9* 36.5  MCV 91.6 95.1  PLT 165 190   Cardiac Enzymes: No results for input(s): CKTOTAL, CKMB, CKMBINDEX, TROPONINI in the last 168 hours. BNP: BNP (last 3 results) Recent Labs    08/15/18 1019  BNP 23.6    ProBNP (last 3 results) No results for input(s): PROBNP in the last 8760 hours.  CBG: Recent Labs  Lab 09/14/18 1258 09/14/18 1730 09/14/18 2024 09/15/18 0854 09/15/18 1126  GLUCAP 126* 195* 208* 121* 170*       Signed:  Nita Sells MD   [facility] 09/15/2018, 1:27 PM

## 2018-09-15 NOTE — Plan of Care (Signed)
  Problem: Clinical Measurements: Goal: Will remain free from infection Outcome: Progressing   Problem: Nutrition: Goal: Adequate nutrition will be maintained Outcome: Progressing   Problem: Pain Managment: Goal: General experience of comfort will improve Outcome: Progressing   Problem: Skin Integrity: Goal: Risk for impaired skin integrity will decrease Outcome: Progressing   

## 2018-09-15 NOTE — Progress Notes (Signed)
Attempted to give report but RN informed that they are doing shift change and requested a callback.

## 2018-09-15 NOTE — Care Management Important Message (Signed)
Important Message  Patient Details  Name: April [name] MRN: [id number] Date of Birth: May 31, 1950   Medicare Important Message Given:  Yes     Memory Argue 09/15/2018, 4:24 PM

## 2018-09-15 NOTE — TOC Transition Note (Signed)
Transition of Care [facility]) - CM/SW Discharge Note   Patient Details  Name: April [name] MRN: [id number] Date of Birth: 24-Oct-1950  [facility] CM/SW Contact:  Ninfa Meeker, RN Phone Number: [phone] (working remotely) 09/15/2018, 2:13 PM   Clinical Narrative:    Patient will discharge to: [place] Date: 09/15/18 Patient was notified and will contact her husband Transport by: [facility]  Discharge summary has been faxed to [facility], Medical necessity form and demographics have been printed to nursing unit. Bedside RN notified to call report to [phone]. [facility] called for transport, patient will be next available.    Final next level of care: Skilled Nursing Facility Barriers to Discharge: (needed facility with Barriatric Bed/hoyer)   Patient Goals and CMS Choice Patient states their goals for this hospitalization and ongoing recovery are:: to recover CMS Medicare.gov Compare Post Acute Care list provided to:: Patient Choice offered to / list presented to : Patient  Discharge Placement                       Discharge Plan and Services In-house Referral: Clinical Social Work Discharge Planning Services: CM Consult Post Acute Care Choice: [place]          DME Arranged: N/A DME Agency: NA       HH Arranged: NA HH Agency: NA        Social Determinants of Health (SDOH) Interventions     Readmission Risk Interventions No flowsheet data found.

## 2018-09-15 NOTE — Plan of Care (Signed)
  Problem: Education: Goal: Knowledge of General Education information will improve Description: Including pain rating scale, medication(s)/side effects and non-pharmacologic comfort measures Outcome: Progressing   Problem: Elimination: Goal: Will not experience complications related to bowel motility Outcome: Progressing Goal: Will not experience complications related to urinary retention Outcome: Progressing   Problem: Pain Managment: Goal: General experience of comfort will improve Outcome: Progressing   Problem: Safety: Goal: Ability to remain free from injury will improve Outcome: Progressing

## 2018-09-15 NOTE — Progress Notes (Signed)
Called back and gave discharge report to Harless Nakayama, LPN of [facility].

## 2018-09-15 NOTE — Progress Notes (Signed)
Patients questions and concerns addressed, discharged to facility with belongings via [place]. Pt not in distress.

## 2018-09-16 ENCOUNTER — Telehealth: Payer: Self-pay

## 2018-09-16 ENCOUNTER — Ambulatory Visit: Payer: Medicare Other | Admitting: Podiatry

## 2018-09-16 NOTE — Telephone Encounter (Signed)
Pt is on TCM list. Admitted on 09/07/2018 and dc'ed on 09/15/2018. Pt is following up with Ortho

## 2018-09-19 ENCOUNTER — Ambulatory Visit (INDEPENDENT_AMBULATORY_CARE_PROVIDER_SITE_OTHER): Payer: Medicare Other | Admitting: Orthopedic Surgery

## 2018-09-19 ENCOUNTER — Ambulatory Visit: Payer: Self-pay

## 2018-09-19 ENCOUNTER — Other Ambulatory Visit: Payer: Self-pay

## 2018-09-19 ENCOUNTER — Encounter: Payer: Self-pay | Admitting: Orthopedic Surgery

## 2018-09-19 VITALS — Ht 64.02 in | Wt 352.3 lb

## 2018-09-19 DIAGNOSIS — S93412A Sprain of calcaneofibular ligament of left ankle, initial encounter: Secondary | ICD-10-CM | POA: Diagnosis not present

## 2018-09-19 DIAGNOSIS — M79672 Pain in left foot: Secondary | ICD-10-CM | POA: Diagnosis not present

## 2018-09-19 NOTE — Progress Notes (Signed)
PATIENT: April [name] Zettie Pho DOB: 1950-03-04  REASON FOR VISIT: follow up HISTORY FROM: patient  HISTORY OF PRESENT ILLNESS: Today 09/20/18  HISTORY  HISTORY ( Initial evaluation 10/25/2012): evaluation of bilateral muscle cramping.  She had past medical history of obesity, diabetes, hyperlipidemia, hypertension, presenting with bilateral feet paresthesia muscle cramping. saw her previously for diabetic peripheral neuropathy, she complained of bilateral knee pain, bilateral feet paresthesia, difficulty bearing weight, gait difficulty, EMG nerve conduction study in January 2013 confirmed the diagnosis of axonal peripheral neuropathy,   She has been taking Topamax 100 mg twice a day for bilateral feet paresthesia, previous laboratory evaluation demonstrated normal sodium, potassium, magnesium, CPK of 92.  She continued to suffer severe morbid obesity,  low back pain, gait difficulty, bilateral knee pain, recent in one year, she also began to suffer bilateral feet muscle cramping, intermittent, severe, lasting 7-8 minutes, happen about once a week. It often involves her feet muscles, also involves her thigh muscles.  She continued to have significant obesity, gait difficulty.  Lab showed low B12 183 October 2014, received B12 shot, now monthly, repeat B12 level January 2015 was normal 622  She came in with wheelchair, last visit was with Hoyle Sauer in January 2015, she recently developed acid reflux, now on prilosec, mild difficulty swallowing. She still has feet swelling, bilateral feet numbness, burning sensation, frequent bilateral feet, and leg muscle crampiness, couple times a week, woke her up from sleep, very painful, she is taking Topamax 100 mg twice a day, which seems to help her some,  She has become more sedentary cause of her weight, knee pain, sitting in wheelchair most of the time.  She received home physical therapy,now using hospital bed and gel overlay, lower air loss  mattress, new walker, overhead trapezius bar, because of her limited mobility,obesity, orthostatic dizziness, multiple joints pain, especially right knee pain, bilateral feet paresthesia, needle prick pain. She also developed bilateral posterior thigh area skin abrasion, pressor ulcers.  UPDATE June 15th 2017: Last visit was with Hoyle Sauer in December 2016, she is accompanied by her father at today's clinical visit, she was admitted to the hospital in March 11 through 14 2017 for bilateral lower extremity cellulitis  I reviewed hospital records, she presented with bilateral lower extremity swelling and pain, blisters on her right leg, echocardiogram showed grade 1 diastolic dysfunction, there was no evidence of right cardiac failure, ejection fraction 65-70%, no evidence of pulmonary edema, she was aggressively diuresed with Lasix, was negative balance of 4700 cc, was also treated with 5 days of antibiotics, her symptoms has much improved,  I have reviewed laboratory evaluation, A1c was normal 5.4, B12 was within normal limits, normal CBC,with exception of mild anemia hemoglobin of 10 point 9, BMP showed mild elevated glucose 105, creatinine 1.2  She is taking gabapentin 300 mg 3 times a day and also Topamax 100 mg twice a day for her neuropathic pain involving bilateral lower extremity, she has a history of kidney stone, I have advised her stop Topamax, add on Cymbalta 60 mg daily, long-term, continues can be associated with weight gain, if she is not sure about the benefit from the medication, she should stop taking gabapentin use as well  Today she also complains of worsening bilateral hands paresthesia, involving fourth and fifth fingers, and also first 3 fingers, worse on the right side, mild weakness, she denies neck pain  Update March 20 2016: Last clinical visit was in June 2017, she returned for electrodiagnostic study today,  which showed probable mild axonal peripheral neuropathy,  bilateral carpal tunnel syndromes, left-sided severe, right side is mild,  She continued to combat obesity, diffuse body achy pain, most bothersome symptoms is at the bottom of her feet, she had a severe needle prick, shooting pain, difficulty bearing weight,  She was taking gabapentin, was switched to Lyrica on titrating dose 100 mg 3 times a day with limited response, she also complains of side effect of excessive drowsiness with Lyrica.  I reviewed laboratory evaluation in December 2017 mild elevated creatinine 1.2, normal TSH, CBC, A1c 5.5,  UPDATE Jun 02 2016: I reviewed laboratory evaluation in April 2018: Creatinine was normal 1.17, GFR 60, A1c 5.6, today she complains of left lateral thigh area paresthesia, low back pain, she is no longer ambulatory, dealing with significant obesity,  UPDATE July 16 2016: She complains of bilateral lateral thigh area paresthesia, burning, numbness, she is now taking Tylenol, ibuprofen. She has tried diclofenac gel without help  She used to take high-dose of Lyrica now started on low-dose 50 mg twice a day since May 13 2016 by her primary care physician Dr. Celso Amy, low-dose help her some, previously while she was taking high-dose of Lyrica she complains of confusion blurry vision, shakiness  She is also taking Topamax 100 mg twice a day  Have reviewed laboratory evaluation in May 2018, normal and active folic acid, C-reactive protein, ESR, vitamin D, CPK, TSH, vitamin B12, ANA, BMP, A1c 5.6,  UPDATE July 22 2017: She is accompanied by her father at today's clinical visit, she is concerned about her diffuse body achy pain, lower extremity swelling, continue combating significant obesity, wheelchair-bound, she wants to take Topamax to help, but I have emphasized with her with her history of kidney stone, Topamax is no longer good choice, she is taking Cymbalta 60 mg daily at home which has been helpful for  Update September 20, 2018 SS:  Admitted to the hospital (09/07/2018) for chronic lower extremity wound, possible osteomyelitis of left calcaneus, given broad-spectrum antibiotics, discharged home with physical therapy/wound care. She is at [facility]. She saw Dr. [name], sprained left ankle (standing in bathroom, heard a pop in ankle), will take time to heel. She is wheelchair bound, has started trying to standing to pivot, and up to walker. She fell in January, got wound to right LE. She is taking gabapentin 100 mg TID, higher doses causes confusion, Trileptal 300 mg BID. She sleeps okay at night, she has chronic pain issues. With the feet, she may have numbness and feeling of nails. She was brought to this appointment by [facility].  REVIEW OF SYSTEMS: Out of a complete 14 system review of symptoms, the patient complains only of the following symptoms, and all other reviewed systems are negative.  Wounds, burning in her feet  ALLERGIES: Allergies  Allergen Reactions  . Penicillins Rash and Other (See Comments)    She was told not to take it anymore. Has patient had a PCN reaction causing immediate rash, facial/tongue/throat swelling, SOB or lightheadedness with hypotension: Yes Has patient had a PCN reaction causing severe rash involving mucus membranes or skin necrosis: No Has patient had a PCN reaction that required hospitalization: No Has patient had a PCN reaction occurring within the last 10 years: No If all of the above answers are "NO", then may proceed with Cephalosporin use.'  . Shellfish Allergy Anaphylaxis  . Sulfonamide Derivatives Anaphylaxis and Other (See Comments)    REACTION: internal "burning"  . Hydrochlorothiazide  W-Triamterene Other (See Comments)    Hypokalemia  . Lotensin [Benazepril Hcl] Hives  . Dipyridamole Other (See Comments)    Unknown reaction  . Estrogens Other (See Comments)    Unknown reaction  . Hydrochlorothiazide Other (See Comments)    Unknown reaction  . Metronidazole Other (See  Comments)    Unknown reaction  . Other     Pickles-itching  . Spironolactone Other (See Comments)    UNSPECIFIED > "kidney problems"  . Sulfa Antibiotics Other (See Comments)    Unknown reaction  . Torsemide Other (See Comments)    Unknown reaction  . Valsartan Other (See Comments)    Unknown reaction  . Madelaine Bhat Isothiocyanate] Itching    HOME MEDICATIONS: Outpatient Medications Prior to Visit  Medication Sig Dispense Refill  . albuterol (PROVENTIL) (2.5 MG/3ML) 0.083% nebulizer solution Take 3 mLs (2.5 mg total) by nebulization every 6 (six) hours as needed for wheezing or shortness of breath. 75 mL 12  . allopurinol (ZYLOPRIM) 100 MG tablet Take 1 tablet (100 mg total) by mouth daily. 90 tablet 1  . aspirin (ECOTRIN LOW STRENGTH) 81 MG EC tablet Take 81 mg by mouth at bedtime.     . budesonide-formoterol (SYMBICORT) 160-4.5 MCG/ACT inhaler one - two inhalations every 12 hours; gargle and spit after use (Patient taking differently: Inhale 2 puffs into the lungs 2 (two) times daily as needed (shortness of breath). gargle and spit after use) 1 Inhaler 4  . calcium carbonate (TUMS - DOSED IN MG ELEMENTAL CALCIUM) 500 MG chewable tablet Chew 2 tablets by mouth daily as needed for indigestion or heartburn.    . carvedilol (COREG) 25 MG tablet TAKE 1 TABLET BY MOUTH TWICE DAILY WITH A MEAL (Patient taking differently: Take 25 mg by mouth daily. ) 60 tablet 5  . colchicine 0.6 MG tablet Take 1 tablet (0.6 mg total) by mouth daily. 30 tablet 0  . collagenase (SANTYL) ointment Apply topically daily. 15 g 0  . cyanocobalamin (,VITAMIN B-12,) 1000 MCG/ML injection INJECT 1ML INTO MUSCLE EVERY 30 DAYS (Patient taking differently: Inject 1,000 mcg into the muscle every 30 (thirty) days. ) 10 mL 0  . diclofenac sodium (VOLTAREN) 1 % GEL Apply 2 g topically 4 (four) times daily. (Patient taking differently: Apply 2 g topically 4 (four) times daily as needed (pain). ) 100 g 11  . EPINEPHrine 0.3  mg/0.3 mL IJ SOAJ injection Inject 0.3 mLs (0.3 mg total) into the muscle once as needed for anaphylaxis. 2 mL 1  . famotidine (PEPCID) 20 MG tablet Take 1 tablet (20 mg total) by mouth 2 (two) times daily. 641 tablet 3  . folic acid (FOLVITE) 583 MCG tablet Take 400 mcg by mouth at bedtime.     . furosemide (LASIX) 80 MG tablet 1.5 tablets in morning, 1 tablet in afternoon (Patient taking differently: Take 80-120 mg by mouth 2 (two) times daily. 1.5 tablets in morning, 1 tablet in afternoon) 225 tablet 3  . gabapentin (NEURONTIN) 100 MG capsule Take 1 capsule (100 mg total) by mouth 3 (three) times daily. 90 capsule 0  . hydrOXYzine (ATARAX/VISTARIL) 25 MG tablet Take 25 mg by mouth every 6 (six) hours as needed for itching.     . montelukast (SINGULAIR) 10 MG tablet Take 1 tablet (10 mg total) by mouth at bedtime. 30 tablet 5  . Omega-3 Fatty Acids (FISH OIL) 1000 MG CAPS Take 1,000 mg by mouth daily.     . Oxcarbazepine (TRILEPTAL) 300 MG  tablet Take 1 tablet (300 mg total) by mouth 2 (two) times daily. 60 tablet 0  . oxyCODONE-acetaminophen (PERCOCET) 5-325 MG tablet Take 1 tablet by mouth every 6 (six) hours as needed for moderate pain or severe pain. 15 tablet 0  . permethrin (ELIMITE) 5 % cream Apply 1 application topically as needed. Hives and rash on arms and chest    . predniSONE (DELTASONE) 20 MG tablet Take 2 tablets (40 mg total) by mouth daily with breakfast for 3 days, THEN 1 tablet (20 mg total) daily with breakfast for 3 days. 9 tablet 0  . silver sulfADIAZINE (SILVADENE) 1 % cream Apply 1 application topically daily. 400 g 0  . simvastatin (ZOCOR) 40 MG tablet Take 1 tablet (40 mg total) by mouth at bedtime. 90 tablet 1  . spironolactone (ALDACTONE) 25 MG tablet TAKE 1 TABLET BY MOUTH ONCE DAILY (Patient taking differently: Take 25 mg by mouth daily. ) 90 tablet 1  . mineral oil-hydrophilic petrolatum (AQUAPHOR) ointment Apply topically as needed for dry skin. 420 g 0   No  facility-administered medications prior to visit.     PAST MEDICAL HISTORY: Past Medical History:  Diagnosis Date  . Anemia   . Asthma   . CAD (coronary artery disease)   . Carpal tunnel syndrome   . Cellulitis of both lower extremities 04/11/2015  . CHF (congestive heart failure) ([place])   . Chronic kidney disease    [facility]- followed by [facility]  . Colon polyp, hyperplastic 2007 & 2012  . Complication of anesthesia 1999   svt with renal calculi surgery, no problems since  . CTS (carpal tunnel syndrome)    bilateral  . Diabetes mellitus   . Eczema   . GERD (gastroesophageal reflux disease)   . History of kidney stones 1999  . Hyperlipidemia   . Hypertension   . Leg ulcer ([place]) 04/24/2015   Right lateral leg No evidence of an infection Monitor closely Keep edema controlled   . Leg ulcer ([place])    right lower  . Meralgia paresthetica    Dr. [name]  . Morbid obesity ([place])   . Morbid obesity ([place])   . Neuropathy    toes and legs  . Osteoarthrosis, unspecified whether generalized or localized, lower leg    knee  . PUD (peptic ulcer disease)   . Shortness of breath dyspnea    with exertion  . Sleep apnea    per progress note 02/25/2018  . Type II or unspecified type diabetes mellitus without mention of complication, not stated as uncontrolled   . Unspecified hereditary and idiopathic peripheral neuropathy   . Urticaria   . Vitamin B12 deficiency   . Wears glasses   . Wound cellulitis    right upper leg, healing well    PAST SURGICAL HISTORY: Past Surgical History:  Procedure Laterality Date  . ABDOMINAL HYSTERECTOMY    . CARDIAC CATHETERIZATION  2002   non obstructive disease  . colonoscopy with polypectomy  2007 & 2012    hyperplastic ;Dr Watt Climes  . COLONOSCOPY WITH PROPOFOL N/A 06/04/2015   Procedure: COLONOSCOPY WITH PROPOFOL;  Surgeon: Jerene Bears, MD;  Location: [facility];  Service: Gastroenterology;  Laterality: N/A;  . DEBRIDEMENT LEG Right 03/02/2018   WOUND  VAC APPLIED  . DEBRIDEMENT LEG Right 05/27/2018   RIGHT LOWER LEG DEBRIDEMENT,  SKIN GRAFT, VAC PLACEMENT   . DILATION AND CURETTAGE OF UTERUS     multiple  . HEMORRHOID SURGERY    .  I&D EXTREMITY Right 03/02/2018   Procedure: RIGHT LEG DEBRIDEMENT AND PLACE VAC;  Surgeon: [name], MD;  Location: [place];  Service: Orthopedics;  Laterality: Right;  . I&D EXTREMITY Right 03/04/2018   Procedure: REPEAT IRRIGATION AND DEBRIDEMENT RIGHT LEG, PLACE WOUND VAC;  Surgeon: [name], MD;  Location: [place];  Service: Orthopedics;  Laterality: Right;  . I&D EXTREMITY Right 03/30/2018   Procedure: IRRIGATION AND DEBRIDEMENT RIGHT LEG, APPLY WOUND VAC;  Surgeon: [name], MD;  Location: [place];  Service: Orthopedics;  Laterality: Right;  . I&D EXTREMITY Right 05/27/2018   Procedure: RIGHT LOWER LEG DEBRIDEMENT,  SKIN GRAFT, VAC PLACEMENT;  Surgeon: [name], MD;  Location: [place];  Service: Orthopedics;  Laterality: Right;  RIGHT LOWER LEG DEBRIDEMENT,  SKIN GRAFT, VAC PLACEMENT  . renal calculi  12/1997   SVT with induction of anesthesia  . right knee arthroscopy    . SKIN SPLIT GRAFT Right 03/04/2018   Procedure: POSSIBLE SPLIT THICKNESS SKIN GRAFT;  Surgeon: [name], MD;  Location: [place];  Service: Orthopedics;  Laterality: Right;  . SKIN SPLIT GRAFT Right 04/01/2018   Procedure: REPEAT IRRIGATION AND DEBRIDEMENT RIGHT LEG, APPLY SPLIT THICKNESS SKIN GRAFT;  Surgeon: [name], MD;  Location: [place];  Service: Orthopedics;  Laterality: Right;  . TONSILLECTOMY AND ADENOIDECTOMY      FAMILY HISTORY: Family History  Problem Relation Age of Onset  . Colon cancer Mother   . Prostate cancer Father   . Colon cancer Father   . Diabetes Maternal Aunt   . Breast cancer Maternal Aunt   . Diabetes Maternal Uncle   . Diabetes Paternal Aunt   . Stroke Paternal Aunt        > 65  . Heart disease Paternal Aunt   . Diabetes Paternal Uncle   . Breast cancer Maternal Aunt         X 2   . Breast cancer Cousin     SOCIAL HISTORY: Social History   Socioeconomic History  . Marital status: Married    Spouse name: [name]  . Number of children: 1  . Years of education: BS  . Highest education level: Not on file  Occupational History  . Occupation: Disabled    Employer: RETIRED  Social Needs  . Financial resource strain: Not on file  . Food insecurity    Worry: Not on file    Inability: Not on file  . Transportation needs    Medical: Not on file    Non-medical: Not on file  Tobacco Use  . Smoking status: Never Smoker  . Smokeless tobacco: Never Used  Substance and Sexual Activity  . Alcohol use: No    Alcohol/week: 0.0 standard drinks  . Drug use: No  . Sexual activity: Not on file  Lifestyle  . Physical activity    Days per week: Not on file    Minutes per session: Not on file  . Stress: Not on file  Relationships  . Social Herbalist on phone: Not on file    Gets together: Not on file    Attends religious service: Not on file    Active member of club or organization: Not on file    Attends meetings of clubs or organizations: Not on file    Relationship status: Not on file  . Intimate partner violence    Fear of current or ex partner: Not on file  Emotionally abused: Not on file    Physically abused: Not on file    Forced sexual activity: Not on file  Other Topics Concern  . Not on file  Social History Narrative   Patient lives at home with her husband [name] Patient is retired and has a Conservation officer, nature.    Caffeine - some times.   Right handed.      PHYSICAL EXAM  Vitals:   09/20/18 0928  BP: 124/72  Pulse: 76  Temp: 98 F (36.7 C)  Weight: (!) 348 lb 3.2 oz (157.9 kg)  Height: '5\' 4"'  (1.626 m)   Body mass index is 59.77 kg/m.  Generalized: Well developed, in no acute distress   Neurological examination  Mentation: Alert oriented to time, place, history taking. Follows all commands speech and language  fluent Cranial nerve II-XII: Pupils were equal round reactive to light. Extraocular movements were full, visual field were full on confrontational test. Facial sensation and strength were normal. Head turning and shoulder shrug  were normal and symmetric. Motor: The motor testing reveals 4 over 5 strength of upper extremities, 3/5 lower extremities.  Good symmetric motor tone is noted throughout.  Sensory: Sensory testing is decreased to soft touch bilateral lower extremities.  Ace wraps to both LE Coordination: Cerebellar testing reveals good finger-nose-finger bilaterally, unable to perform heel-to-shin Gait and station: In wheelchair, able to stand for weight check, unable to ambulate independently, morbidly obese.   DIAGNOSTIC DATA (LABS, IMAGING, TESTING) - I reviewed patient records, labs, notes, testing and imaging myself where available.  Lab Results  Component Value Date   WBC 5.9 09/15/2018   HGB 11.3 (L) 09/15/2018   HCT 36.5 09/15/2018   MCV 95.1 09/15/2018   PLT 190 09/15/2018      Component Value Date/Time   NA 140 09/15/2018 0413   NA 145 (H) 10/25/2012 1117   K 3.9 09/15/2018 0413   CL 99 09/15/2018 0413   CO2 31 09/15/2018 0413   GLUCOSE 122 (H) 09/15/2018 0413   BUN 34 (H) 09/15/2018 0413   BUN 24 10/25/2012 1117   CREATININE 1.47 (H) 09/15/2018 0413   CALCIUM 8.6 (L) 09/15/2018 0413   PROT 6.5 08/15/2018 1019   PROT 6.4 10/25/2012 1117   ALBUMIN 2.8 (L) 09/15/2018 0413   ALBUMIN 4.1 10/25/2012 1117   AST 20 08/15/2018 1019   ALT 15 08/15/2018 1019   ALKPHOS 74 08/15/2018 1019   BILITOT 0.6 08/15/2018 1019   GFRNONAA 36 (L) 09/15/2018 0413   GFRAA 42 (L) 09/15/2018 0413   Lab Results  Component Value Date   CHOL 154 07/15/2018   HDL 42.50 07/15/2018   LDLCALC 93 07/15/2018   LDLDIRECT 169.4 05/02/2007   TRIG 89.0 07/15/2018   CHOLHDL 4 07/15/2018   Lab Results  Component Value Date   HGBA1C 6.0 (H) 09/08/2018   Lab Results  Component Value Date    VITAMINB12 1,006 (H) 12/03/2017   Lab Results  Component Value Date   TSH 2.10 12/03/2017    ASSESSMENT AND PLAN 68 y.o. year old female  has a past medical history of Anemia, Asthma, CAD (coronary artery disease), Carpal tunnel syndrome, Cellulitis of both lower extremities (04/11/2015), CHF (congestive heart failure) ([place]), Chronic kidney disease, Colon polyp, hyperplastic (1610 & 9604), Complication of anesthesia (1999), CTS (carpal tunnel syndrome), Diabetes mellitus, Eczema, GERD (gastroesophageal reflux disease), History of kidney stones (1999), Hyperlipidemia, Hypertension, Leg ulcer ([place]) (04/24/2015), Leg ulcer ([place]), Meralgia paresthetica, Morbid obesity ([place]), Morbid  obesity ([place]), Neuropathy, Osteoarthrosis, unspecified whether generalized or localized, lower leg, PUD (peptic ulcer disease), Shortness of breath dyspnea, Sleep apnea, Type II or unspecified type diabetes mellitus without mention of complication, not stated as uncontrolled, Unspecified hereditary and idiopathic peripheral neuropathy, Urticaria, Vitamin B12 deficiency, Wears glasses, and Wound cellulitis. here with:  1.  Morbid obesity 2.  Mild axonal peripheral neuropathy, diffuse body aching pain -She is in rehab at [facility] care, following several hospitalizations for wounds, osteomyelitis left foot -She can continue gabapentin 100 mg 3 times a day, Trileptal 300 mg twice a day (I have filled out her order forms from facility), this combination of medications seems to be beneficial  -We will need check of sodium about every 6 months while taking Trileptal, 09/13/2018 sodium 139 3.  Bilateral carpal tunnel syndromes -She does not wish to have surgery  She will follow-up on an as-needed basis with this office.  Continue follow-up with primary care.   I spent 15 minutes with the patient. 50% of this time was spent discussing her plan of care.   Butler Denmark, AGNP-C, DNP 09/20/2018, 11:46 AM [facility] [address] [place], [place] [address] [phone]

## 2018-09-20 ENCOUNTER — Encounter: Payer: Self-pay | Admitting: Neurology

## 2018-09-20 ENCOUNTER — Ambulatory Visit (INDEPENDENT_AMBULATORY_CARE_PROVIDER_SITE_OTHER): Payer: Medicare Other | Admitting: Neurology

## 2018-09-20 ENCOUNTER — Encounter: Payer: Self-pay | Admitting: Orthopedic Surgery

## 2018-09-20 VITALS — BP 124/72 | HR 76 | Temp 98.0°F | Ht 64.0 in | Wt 348.2 lb

## 2018-09-20 DIAGNOSIS — G609 Hereditary and idiopathic neuropathy, unspecified: Secondary | ICD-10-CM | POA: Diagnosis not present

## 2018-09-20 NOTE — Patient Instructions (Addendum)
Please follow-up with our clinic as needed. Continue follow-up with primary care doctor and orthopedics for wounds, ankle injury. You may continue gabapentin 100 mg three times daily, Trileptal 300 mg twice daily for neuropathy pain. You should have sodium level checked about every 6 months while taking Trileptal.

## 2018-09-20 NOTE — Progress Notes (Signed)
Office Visit Note   Patient: April [name]           Date of Birth: [birth year]           MRN: [id number] Visit Date: 09/19/2018              Requested by: [name], MD [place],  [place] [address] PCP: [name], MD  Chief Complaint  Patient presents with  . Right Leg - Follow-up, Pain  . Left Ankle - Pain      HPI: Patient is a 68 year old woman who presents for 2 separate issues #1 acute sprain of the left ankle #2 chronic ulcer right calf.  Patient states that she was using the Silvadene had no symptoms however she states that at skilled care with Silvadene was stopped stating that she had an allergy to it.  Assessment & Plan: Visit Diagnoses:  1. Left foot pain   2. Sprain of calcaneofibular ligament of left ankle, initial encounter     Plan: Continue with dry dressing changes and compression to the right lower extremity increase her activities as tolerated on the left ankle no restrictions.  Follow-Up Instructions: Return in about 4 weeks (around 10/17/2018).   Ortho Exam  Patient is alert, oriented, no adenopathy, well-dressed, normal affect, normal respiratory effort. Examination left ankle patient has no instability with the medial or lateral ankle ligaments anterior drawer is stable she is tender to palpation over the anterior talofibular ligament syndesmosis is nontender to palpation the deltoid ligament is minimally tender to palpation.  There is no skin breakdown no bruising.  Examination of the right calf ulcer shows significant improvement it is currently 10 x 30 mm and 0.1 mm deep with 100% granulation tissue.  Imaging: Xr Ankle 2 Views Left  Result Date: 09/20/2018 2 view radiographs of the left ankle shows a congruent mortise there is no joint space narrowing no subcondylar cysts no fractures  No images are attached to the encounter.  Labs: Lab Results  Component Value Date   HGBA1C 6.0 (H) 09/08/2018   HGBA1C 6.4 07/15/2018   HGBA1C 6.1 (H) 05/27/2018   ESRSEDRATE 42 (H) 08/12/2018   ESRSEDRATE 56 (H) 03/09/2018   ESRSEDRATE 13 06/02/2016   CRP 6.3 (H) 03/09/2018   CRP 2.2 06/02/2016   LABURIC 10.5 (H) 09/13/2018   LABURIC 11.0 (H) 09/11/2018   LABURIC 12.5 (H) 09/08/2018   REPTSTATUS 08/20/2018 FINAL 08/15/2018   GRAMSTAIN  05/27/2018    FEW WBC PRESENT, PREDOMINANTLY PMN MODERATE GRAM POSITIVE RODS RARE GRAM POSITIVE COCCI IN PAIRS    CULT  08/15/2018    NO GROWTH 5 DAYS Performed at [facility], [place] [address], [place],  [address]    LABORGA METHICILLIN RESISTANT STAPHYLOCOCCUS AUREUS 05/27/2018     Lab Results  Component Value Date   ALBUMIN 2.8 (L) 09/15/2018   ALBUMIN 3.3 (L) 08/15/2018   ALBUMIN 3.7 07/15/2018   LABURIC 10.5 (H) 09/13/2018   LABURIC 11.0 (H) 09/11/2018   LABURIC 12.5 (H) 09/08/2018    Lab Results  Component Value Date   MG 2.1 09/12/2018   MG 2.1 08/20/2018   MG 2.1 08/18/2018   Lab Results  Component Value Date   VD25OH 47.2 06/02/2016   VD25OH 42 01/23/2011    No results found for: PREALBUMIN CBC EXTENDED Latest Ref Rng & Units 09/15/2018 09/13/2018 09/08/2018  WBC 4.0 - 10.5 K/uL 5.9 5.3 4.0  RBC 3.87 - 5.11 MIL/uL 3.84(L)  3.59(L) 3.56(L)  HGB 12.0 - 15.0 g/dL 11.3(L) 10.3(L) 10.3(L)  HCT 36.0 - 46.0 % 36.5 32.9(L) 33.3(L)  PLT 150 - 400 K/uL 190 165 134(L)  NEUTROABS 1.7 - 7.7 K/uL 3.8 - -  LYMPHSABS 0.7 - 4.0 K/uL 1.4 - -     Body mass index is 60.43 kg/m.  Orders:  Orders Placed This Encounter  Procedures  . XR Ankle 2 Views Left   No orders of the defined types were placed in this encounter.    Procedures: No procedures performed  Clinical Data: No additional findings.  ROS:  All other systems negative, except as noted in the HPI. Review of Systems  Objective: Vital Signs: Ht 5' 4.02" (1.626 m)   Wt (!) 352 lb 4.8 oz (159.8 kg)   BMI 60.43 kg/m   Specialty Comments:  No specialty comments available.  PMFS  History: Patient Active Problem List   Diagnosis Date Noted  . Achilles tendon sprain, left, initial encounter   . Osteomyelitis of ankle or foot, acute, left ([place]) 09/07/2018  . Osteomyelitis of ankle or foot, left, acute ([place]) 09/07/2018  . Intractable pain 08/17/2018  . Acute on chronic kidney failure ([place]) 08/15/2018  . Onychomycosis 05/19/2018  . Idiopathic chronic venous hypertension of right lower extremity with ulcer and inflammation ([place]) 05/19/2018  . Traumatic wound dehiscence   . Skin lumps 01/14/2018  . Bilateral leg cramps 12/14/2017  . Dry skin dermatitis 06/29/2017  . Left shoulder pain 08/14/2016  . Meralgia paresthetica 07/16/2016  . Chronic left-sided low back pain with left-sided sciatica 06/02/2016  . Whole body pain 03/20/2016  . Diabetic peripheral neuropathy ([place]) 03/20/2016  . Osteopenia 01/11/2016  . CKD (chronic kidney disease) stage 3, GFR 30-59 ml/min (HCC) 01/02/2016  . Hand paresthesia 07/18/2015  . Carpal tunnel syndrome 06/07/2015  . Family history of colon cancer   . Diabetes mellitus with neurological manifestations ([place]) 04/18/2015  . Bilateral leg edema 04/11/2015  . Abnormality of gait 01/03/2015  . Hereditary and idiopathic peripheral neuropathy 01/03/2015  . GERD (gastroesophageal reflux disease) 06/17/2014  . Hx of colonic polyps 12/15/2012  . Vitamin B12 deficiency 11/03/2012  . Intrinsic asthma 03/23/2012  . Chronic diastolic heart failure ([place]) 02/20/2011  . OSA (obstructive sleep apnea) 09/17/2010  . URINARY URGENCY 01/08/2010  . CAD, NATIVE VESSEL 11/20/2008  . Osteoarthritis of knees, bilateral 06/14/2008  . Hyperlipidemia 05/10/2007  . Essential hypertension 01/18/2007  . HYPOKALEMIA 04/30/2006  . Morbid (severe) obesity due to excess calories ([place]) 04/30/2006  . HX, PERSONAL, PEPTIC ULCER DISEASE 04/30/2006   Past Medical History:  Diagnosis Date  . Anemia   . Asthma   . CAD (coronary artery disease)   . Carpal tunnel  syndrome   . Cellulitis of both lower extremities 04/11/2015  . CHF (congestive heart failure) ([place])   . Chronic kidney disease    CKI- followed by [facility]  . Colon polyp, hyperplastic 2007 & 2012  . Complication of anesthesia 1999   svt with renal calculi surgery, no problems since  . CTS (carpal tunnel syndrome)    bilateral  . Diabetes mellitus   . Eczema   . GERD (gastroesophageal reflux disease)   . History of kidney stones 1999  . Hyperlipidemia   . Hypertension   . Leg ulcer ([place]) 04/24/2015   Right lateral leg No evidence of an infection Monitor closely Keep edema controlled   . Leg ulcer ([place])    right lower  . Meralgia paresthetica  Dr. [name]  . Morbid obesity ([place])   . Morbid obesity ([place])   . Neuropathy    toes and legs  . Osteoarthrosis, unspecified whether generalized or localized, lower leg    knee  . PUD (peptic ulcer disease)   . Shortness of breath dyspnea    with exertion  . Sleep apnea    per progress note 02/25/2018  . Type II or unspecified type diabetes mellitus without mention of complication, not stated as uncontrolled   . Unspecified hereditary and idiopathic peripheral neuropathy   . Urticaria   . Vitamin B12 deficiency   . Wears glasses   . Wound cellulitis    right upper leg, healing well    Family History  Problem Relation Age of Onset  . Colon cancer Mother   . Prostate cancer Father   . Colon cancer Father   . Diabetes Maternal Aunt   . Breast cancer Maternal Aunt   . Diabetes Maternal Uncle   . Diabetes Paternal Aunt   . Stroke Paternal Aunt        > 65  . Heart disease Paternal Aunt   . Diabetes Paternal Uncle   . Breast cancer Maternal Aunt         X 2  . Breast cancer Cousin     Past Surgical History:  Procedure Laterality Date  . ABDOMINAL HYSTERECTOMY    . CARDIAC CATHETERIZATION  2002   non obstructive disease  . colonoscopy with polypectomy  2007 & 2012    hyperplastic ;Dr Watt Climes  . COLONOSCOPY WITH PROPOFOL  N/A 06/04/2015   Procedure: COLONOSCOPY WITH PROPOFOL;  Surgeon: Jerene Bears, MD;  Location: [facility];  Service: Gastroenterology;  Laterality: N/A;  . DEBRIDEMENT LEG Right 03/02/2018   WOUND VAC APPLIED  . DEBRIDEMENT LEG Right 05/27/2018   RIGHT LOWER LEG DEBRIDEMENT,  SKIN GRAFT, VAC PLACEMENT   . DILATION AND CURETTAGE OF UTERUS     multiple  . HEMORRHOID SURGERY    . I&D EXTREMITY Right 03/02/2018   Procedure: RIGHT LEG DEBRIDEMENT AND PLACE VAC;  Surgeon: [name], MD;  Location: [facility];  Service: Orthopedics;  Laterality: Right;  . I&D EXTREMITY Right 03/04/2018   Procedure: REPEAT IRRIGATION AND DEBRIDEMENT RIGHT LEG, PLACE WOUND VAC;  Surgeon: [name], MD;  Location: [facility];  Service: Orthopedics;  Laterality: Right;  . I&D EXTREMITY Right 03/30/2018   Procedure: IRRIGATION AND DEBRIDEMENT RIGHT LEG, APPLY WOUND VAC;  Surgeon: [name], MD;  Location: [facility];  Service: Orthopedics;  Laterality: Right;  . I&D EXTREMITY Right 05/27/2018   Procedure: RIGHT LOWER LEG DEBRIDEMENT,  SKIN GRAFT, VAC PLACEMENT;  Surgeon: [name], MD;  Location: [facility];  Service: Orthopedics;  Laterality: Right;  RIGHT LOWER LEG DEBRIDEMENT,  SKIN GRAFT, VAC PLACEMENT  . renal calculi  12/1997   SVT with induction of anesthesia  . right knee arthroscopy    . SKIN SPLIT GRAFT Right 03/04/2018   Procedure: POSSIBLE SPLIT THICKNESS SKIN GRAFT;  Surgeon: [name], MD;  Location[place] [facility];  Service: Orthopedics;  Laterality: Right;  . SKIN SPLIT GRAFT Right 04/01/2018   Procedure: REPEAT IRRIGATION AND DEBRIDEMENT RIGHT LEG, APPLY SPLIT THICKNESS SKIN GRAFT;  Surgeon: [name], MD;  Location: [facility];  Service: Orthopedics;  Laterality: Right;  . TONSILLECTOMY AND ADENOIDECTOMY     Social History   Occupational History  . Occupation: Disabled    Employer: RETIRED  Tobacco Use  .  Smoking status: Never Smoker  . Smokeless tobacco: Never Used  Substance and Sexual Activity  .  Alcohol use: No    Alcohol/week: 0.0 standard drinks  . Drug use: No  . Sexual activity: Not on file

## 2018-09-21 NOTE — Progress Notes (Signed)
I have reviewed and agreed above plan. 

## 2018-09-29 ENCOUNTER — Ambulatory Visit: Payer: Medicare Other

## 2018-10-07 LAB — HM DIABETES EYE EXAM

## 2018-10-11 ENCOUNTER — Encounter: Payer: Self-pay | Admitting: Internal Medicine

## 2018-10-17 ENCOUNTER — Ambulatory Visit: Payer: Medicare Other | Admitting: Orthopedic Surgery

## 2018-10-18 ENCOUNTER — Telehealth: Payer: Self-pay

## 2018-10-18 ENCOUNTER — Telehealth: Payer: Self-pay | Admitting: Internal Medicine

## 2018-10-18 NOTE — Telephone Encounter (Signed)
Dorian Pod with [facility] would like to know if patient can be seen for 3 x week for right leg wound care, silvadene dressing daily?  Stated that patient has no caregiver.  Cb# is [phone].  Please advise.  Thank you.

## 2018-10-18 NOTE — Telephone Encounter (Signed)
[place] for [employer].  Is she currently taking potassium - her potassium was good when it was last checked.

## 2018-10-18 NOTE — Telephone Encounter (Signed)
Home Health Verbal Orders - Caller/Agency: Dorian Pod from [facility] Callback Number: [phone], OK to leave a message Requesting OT/PT/Skilled Nursing/Social Work/Speech Therapy: PT Frequency: 2 week 2, 1 week 1  Requesting OT/PT/Skilled Nursing/Social Work/Speech Therapy: nursing Frequency: 1 week 2, 3 week 9  [facility] needs to know if pt needs to be on Potassium.

## 2018-10-18 NOTE — Telephone Encounter (Signed)
Ok, noted thank you.

## 2018-10-18 NOTE — Telephone Encounter (Signed)
Dorian Pod was called and given VO for orders

## 2018-10-18 NOTE — Telephone Encounter (Signed)
FYI Wound care nurse with [facility] called stating that patient was d/c'd from SNF with wound orders for daily dressing changes but [facility] nurse stating they are unable to do this daily and will do it 3 times a week until patient sees Dr [name].

## 2018-10-19 ENCOUNTER — Ambulatory Visit: Payer: Medicare Other | Admitting: Allergy

## 2018-10-19 ENCOUNTER — Telehealth: Payer: Self-pay | Admitting: Internal Medicine

## 2018-10-19 ENCOUNTER — Ambulatory Visit (INDEPENDENT_AMBULATORY_CARE_PROVIDER_SITE_OTHER): Payer: Medicare Other | Admitting: Orthopedic Surgery

## 2018-10-19 DIAGNOSIS — L97919 Non-pressure chronic ulcer of unspecified part of right lower leg with unspecified severity: Secondary | ICD-10-CM

## 2018-10-19 DIAGNOSIS — E1142 Type 2 diabetes mellitus with diabetic polyneuropathy: Secondary | ICD-10-CM

## 2018-10-19 DIAGNOSIS — I87331 Chronic venous hypertension (idiopathic) with ulcer and inflammation of right lower extremity: Secondary | ICD-10-CM | POA: Diagnosis not present

## 2018-10-19 DIAGNOSIS — Z945 Skin transplant status: Secondary | ICD-10-CM

## 2018-10-19 NOTE — Telephone Encounter (Signed)
Nurse aware of response.

## 2018-10-19 NOTE — Telephone Encounter (Signed)
LVM for pt to call back in regards.  

## 2018-10-19 NOTE — Telephone Encounter (Signed)
Patient has scheduled follow up appointment for 9/25- Patient requesting a call back from [place] to discuss.

## 2018-10-20 ENCOUNTER — Encounter: Payer: Self-pay | Admitting: Orthopedic Surgery

## 2018-10-20 ENCOUNTER — Telehealth: Payer: Self-pay | Admitting: Internal Medicine

## 2018-10-20 ENCOUNTER — Telehealth: Payer: Self-pay | Admitting: Orthopedic Surgery

## 2018-10-20 ENCOUNTER — Other Ambulatory Visit: Payer: Self-pay | Admitting: Orthopedic Surgery

## 2018-10-20 DIAGNOSIS — I87331 Chronic venous hypertension (idiopathic) with ulcer and inflammation of right lower extremity: Secondary | ICD-10-CM

## 2018-10-20 MED ORDER — OXYCODONE-ACETAMINOPHEN 5-325 MG PO TABS: 1.0000 | ORAL_TABLET | Freq: Four times a day (QID) | ORAL | 0 refills | Status: DC | PRN

## 2018-10-20 MED ORDER — OXYCODONE-ACETAMINOPHEN 5-325 MG PO TABS: 1.0000 | ORAL_TABLET | Freq: Three times a day (TID) | ORAL | 0 refills | Status: DC | PRN

## 2018-10-20 NOTE — Telephone Encounter (Signed)
Pt called in said she came into the office yesterday to see dr.duda and he was suppose to send in some oxycodone to her pharmacy [facility] on [address].    [phone]

## 2018-10-20 NOTE — Telephone Encounter (Signed)
Patient would like to know if she needs to continue potassium.  States she was not taking this in Rehab.

## 2018-10-20 NOTE — Telephone Encounter (Signed)
ok 

## 2018-10-20 NOTE — Telephone Encounter (Signed)
Spoke with Will and info given.

## 2018-10-20 NOTE — Progress Notes (Signed)
Office Visit Note   Patient: April [name]           Date of Birth: 1950-06-18           MRN: [id number] Visit Date: 10/19/2018              Requested by: [name], MD [place],  [place] [address] PCP: [name], MD  Chief Complaint  Patient presents with  . Right Leg - Follow-up    05/27/18 RLE debridement STSG      HPI: Patient is a 68 year old woman who presents for evaluation of chronic ulcer right calf.  Patient was residing at skilled nursing has been discharged home has been having Aquasol dressing changes with home health nursing.  States has been having some compression garments to her lower extremities as well off and on in the past.  Having some heel pain bilaterally to the plantar aspect of bilateral heels this is worse with standing she states that she has been elevating her limbs around-the-clock offloading the heels.  No recent injuries  Patient in a wheelchair for mobility today.  Standing at home just for transfers only.  Assessment & Plan: Visit Diagnoses:  No diagnosis found.  Plan: Order updated with home health she will apply Aquasol dressings to the right anterior shin ulcer silver cell dressing applied today in the office with Ace wrap for gentle compression.  Dynaflex wraps to the left lower extremity for compression  Follow-Up Instructions: Return in about 2 weeks (around 11/02/2018).   Ortho Exam  Patient is alert, oriented, no adenopathy, well-dressed, normal affect, normal respiratory effort. Examination of the right shin ulcer shows significant improvement it is currently 10 x 20 mm and with no depth with 100% granulation tissue.  Markedly improved will continue with current therapy.  There is no surrounding erythema no drainage no sign of infection.  There is significantly worse edema on the left than the right there is no pitting on the right.  There is no weeping or erythema to the left lower extremity.  Imaging: No results  found. No images are attached to the encounter.  Labs: Lab Results  Component Value Date   HGBA1C 6.0 (H) 09/08/2018   HGBA1C 6.4 07/15/2018   HGBA1C 6.1 (H) 05/27/2018   ESRSEDRATE 42 (H) 08/12/2018   ESRSEDRATE 56 (H) 03/09/2018   ESRSEDRATE 13 06/02/2016   CRP 6.3 (H) 03/09/2018   CRP 2.2 06/02/2016   LABURIC 10.5 (H) 09/13/2018   LABURIC 11.0 (H) 09/11/2018   LABURIC 12.5 (H) 09/08/2018   REPTSTATUS 08/20/2018 FINAL 08/15/2018   GRAMSTAIN  05/27/2018    FEW WBC PRESENT, PREDOMINANTLY PMN MODERATE GRAM POSITIVE RODS RARE GRAM POSITIVE COCCI IN PAIRS    CULT  08/15/2018    NO GROWTH 5 DAYS Performed at [facility], [place] [address] [place], [place] [address]    LABORGA METHICILLIN RESISTANT STAPHYLOCOCCUS AUREUS 05/27/2018     Lab Results  Component Value Date   ALBUMIN 2.8 (L) 09/15/2018   ALBUMIN 3.3 (L) 08/15/2018   ALBUMIN 3.7 07/15/2018   LABURIC 10.5 (H) 09/13/2018   LABURIC 11.0 (H) 09/11/2018   LABURIC 12.5 (H) 09/08/2018    Lab Results  Component Value Date   MG 2.1 09/12/2018   MG 2.1 08/20/2018   MG 2.1 08/18/2018   Lab Results  Component Value Date   VD25OH 47.2 06/02/2016   VD25OH 42 01/23/2011    No results found for: PREALBUMIN  CBC EXTENDED Latest Ref Rng & Units 09/15/2018 09/13/2018 09/08/2018  WBC 4.0 - 10.5 K/uL 5.9 5.3 4.0  RBC 3.87 - 5.11 MIL/uL 3.84(L) 3.59(L) 3.56(L)  HGB 12.0 - 15.0 g/dL 11.3(L) 10.3(L) 10.3(L)  HCT 36.0 - 46.0 % 36.5 32.9(L) 33.3(L)  PLT 150 - 400 K/uL 190 165 134(L)  NEUTROABS 1.7 - 7.7 K/uL 3.8 - -  LYMPHSABS 0.7 - 4.0 K/uL 1.4 - -     There is no height or weight on file to calculate BMI.  Orders:  No orders of the defined types were placed in this encounter.  No orders of the defined types were placed in this encounter.    Procedures: No procedures performed  Clinical Data: No additional findings.  ROS:  All other systems negative, except as noted in the HPI. Review of Systems   Constitutional: Negative for chills and fever.  Cardiovascular: Positive for leg swelling.  Skin: Positive for wound.    Objective: Vital Signs: There were no vitals taken for this visit.  Specialty Comments:  No specialty comments available.  PMFS History: Patient Active Problem List   Diagnosis Date Noted  . Achilles tendon sprain, left, initial encounter   . Osteomyelitis of ankle or foot, acute, left ([place]) 09/07/2018  . Osteomyelitis of ankle or foot, left, acute ([place]) 09/07/2018  . Intractable pain 08/17/2018  . Acute on chronic kidney failure ([place]) 08/15/2018  . Onychomycosis 05/19/2018  . Idiopathic chronic venous hypertension of right lower extremity with ulcer and inflammation ([place]) 05/19/2018  . Traumatic wound dehiscence   . Skin lumps 01/14/2018  . Bilateral leg cramps 12/14/2017  . Dry skin dermatitis 06/29/2017  . Left shoulder pain 08/14/2016  . Meralgia paresthetica 07/16/2016  . Chronic left-sided low back pain with left-sided sciatica 06/02/2016  . Whole body pain 03/20/2016  . Diabetic peripheral neuropathy ([place]) 03/20/2016  . Osteopenia 01/11/2016  . CKD (chronic kidney disease) stage 3, GFR 30-59 ml/min (HCC) 01/02/2016  . Hand paresthesia 07/18/2015  . Carpal tunnel syndrome 06/07/2015  . Family history of colon cancer   . Diabetes mellitus with neurological manifestations ([place]) 04/18/2015  . Bilateral leg edema 04/11/2015  . Abnormality of gait 01/03/2015  . Hereditary and idiopathic peripheral neuropathy 01/03/2015  . GERD (gastroesophageal reflux disease) 06/17/2014  . Hx of colonic polyps 12/15/2012  . Vitamin B12 deficiency 11/03/2012  . Intrinsic asthma 03/23/2012  . Chronic diastolic heart failure ([place]) 02/20/2011  . OSA (obstructive sleep apnea) 09/17/2010  . URINARY URGENCY 01/08/2010  . CAD, NATIVE VESSEL 11/20/2008  . Osteoarthritis of knees, bilateral 06/14/2008  . Hyperlipidemia 05/10/2007  . Essential hypertension 01/18/2007  .  HYPOKALEMIA 04/30/2006  . Morbid (severe) obesity due to excess calories ([place]) 04/30/2006  . HX, PERSONAL, PEPTIC ULCER DISEASE 04/30/2006   Past Medical History:  Diagnosis Date  . Anemia   . Asthma   . CAD (coronary artery disease)   . Carpal tunnel syndrome   . Cellulitis of both lower extremities 04/11/2015  . CHF (congestive heart failure) ([place])   . Chronic kidney disease    [facility]- followed by [facility]  . Colon polyp, hyperplastic 2007 & 2012  . Complication of anesthesia 1999   svt with renal calculi surgery, no problems since  . CTS (carpal tunnel syndrome)    bilateral  . Diabetes mellitus   . Eczema   . GERD (gastroesophageal reflux disease)   . History of kidney stones 1999  . Hyperlipidemia   . Hypertension   .  Leg ulcer ([place]) 04/24/2015   Right lateral leg No evidence of an infection Monitor closely Keep edema controlled   . Leg ulcer ([place])    right lower  . Meralgia paresthetica    Dr. [name]  . Morbid obesity ([place])   . Morbid obesity ([place])   . Neuropathy    toes and legs  . Osteoarthrosis, unspecified whether generalized or localized, lower leg    knee  . PUD (peptic ulcer disease)   . Shortness of breath dyspnea    with exertion  . Sleep apnea    per progress note 02/25/2018  . Type II or unspecified type diabetes mellitus without mention of complication, not stated as uncontrolled   . Unspecified hereditary and idiopathic peripheral neuropathy   . Urticaria   . Vitamin B12 deficiency   . Wears glasses   . Wound cellulitis    right upper leg, healing well    Family History  Problem Relation Age of Onset  . Colon cancer Mother   . Prostate cancer Father   . Colon cancer Father   . Diabetes Maternal Aunt   . Breast cancer Maternal Aunt   . Diabetes Maternal Uncle   . Diabetes Paternal Aunt   . Stroke Paternal Aunt        > 65  . Heart disease Paternal Aunt   . Diabetes Paternal Uncle   . Breast cancer Maternal Aunt         X 2  . Breast  cancer Cousin     Past Surgical History:  Procedure Laterality Date  . ABDOMINAL HYSTERECTOMY    . CARDIAC CATHETERIZATION  2002   non obstructive disease  . colonoscopy with polypectomy  2007 & 2012    hyperplastic ;Dr Watt Climes  . COLONOSCOPY WITH PROPOFOL N/A 06/04/2015   Procedure: COLONOSCOPY WITH PROPOFOL;  Surgeon: Jerene Bears, MD;  [facility];  Service: Gastroenterology;  Laterality: N/A;  . DEBRIDEMENT LEG Right 03/02/2018   WOUND VAC APPLIED  . DEBRIDEMENT LEG Right 05/27/2018   RIGHT LOWER LEG DEBRIDEMENT,  SKIN GRAFT, VAC PLACEMENT   . DILATION AND CURETTAGE OF UTERUS     multiple  . HEMORRHOID SURGERY    . I&D EXTREMITY Right 03/02/2018   Procedure: RIGHT LEG DEBRIDEMENT AND PLACE VAC;  Surgeon: [name], MD;  Location: [facility];  Service: Orthopedics;  Laterality: Right;  . I&D EXTREMITY Right 03/04/2018   Procedure: REPEAT IRRIGATION AND DEBRIDEMENT RIGHT LEG, PLACE WOUND VAC;  Surgeon: [name], MD;  Location: [facility];  Service: Orthopedics;  Laterality: Right;  . I&D EXTREMITY Right 03/30/2018   Procedure: IRRIGATION AND DEBRIDEMENT RIGHT LEG, APPLY WOUND VAC;  Surgeon: [name], MD;  Location: [facility];  Service: Orthopedics;  Laterality: Right;  . I&D EXTREMITY Right 05/27/2018   Procedure: RIGHT LOWER LEG DEBRIDEMENT,  SKIN GRAFT, VAC PLACEMENT;  Surgeon: [name], MD;  Location: [facility];  Service: Orthopedics;  Laterality: Right;  RIGHT LOWER LEG DEBRIDEMENT,  SKIN GRAFT, VAC PLACEMENT  . renal calculi  12/1997   SVT with induction of anesthesia  . right knee arthroscopy    . SKIN SPLIT GRAFT Right 03/04/2018   Procedure: POSSIBLE SPLIT THICKNESS SKIN GRAFT;  Surgeon: [name], MD;  Location: [facility];  Service: Orthopedics;  Laterality: Right;  . SKIN SPLIT GRAFT Right 04/01/2018   Procedure: REPEAT IRRIGATION AND DEBRIDEMENT RIGHT LEG, APPLY SPLIT THICKNESS SKIN GRAFT;  Surgeon: [name], MD;  Location: [facility];  Service: Orthopedics;   Laterality: Right;  . TONSILLECTOMY AND ADENOIDECTOMY     Social History   Occupational History  . Occupation: Disabled    Employer: RETIRED  Tobacco Use  . Smoking status: Never Smoker  . Smokeless tobacco: Never Used  Substance and Sexual Activity  . Alcohol use: No    Alcohol/week: 0.0 standard drinks  . Drug use: No  . Sexual activity: Not on file

## 2018-10-20 NOTE — Telephone Encounter (Signed)
Can you please see below and advise if you want to send in rx.

## 2018-10-20 NOTE — Telephone Encounter (Signed)
rx sent

## 2018-10-20 NOTE — Telephone Encounter (Signed)
Spoke with patient and info given 

## 2018-10-20 NOTE — Telephone Encounter (Signed)
She has an appt with me on 9/25 - she can hold it until then and we can check blood work and confirm she does not need it

## 2018-10-20 NOTE — Telephone Encounter (Signed)
Copied from [place] [phone] [id number]. Topic: General - Other >> Oct 20, 2018  1:01 PM Keene Breath wrote: Reason for CRM: Called for verbal orders for OT - 2x wk for 2 wks, 1x wk for 1 wk.  CB# [phone]

## 2018-10-21 ENCOUNTER — Ambulatory Visit (INDEPENDENT_AMBULATORY_CARE_PROVIDER_SITE_OTHER): Payer: Medicare Other | Admitting: Podiatry

## 2018-10-21 ENCOUNTER — Telehealth: Payer: Self-pay | Admitting: Neurology

## 2018-10-21 ENCOUNTER — Telehealth: Payer: Self-pay | Admitting: Orthopedic Surgery

## 2018-10-21 ENCOUNTER — Other Ambulatory Visit: Payer: Self-pay

## 2018-10-21 ENCOUNTER — Ambulatory Visit: Payer: Medicare Other | Admitting: Orthotics

## 2018-10-21 ENCOUNTER — Encounter: Payer: Self-pay | Admitting: Podiatry

## 2018-10-21 DIAGNOSIS — B351 Tinea unguium: Secondary | ICD-10-CM | POA: Diagnosis not present

## 2018-10-21 DIAGNOSIS — L84 Corns and callosities: Secondary | ICD-10-CM | POA: Diagnosis not present

## 2018-10-21 DIAGNOSIS — E1140 Type 2 diabetes mellitus with diabetic neuropathy, unspecified: Secondary | ICD-10-CM | POA: Diagnosis not present

## 2018-10-21 DIAGNOSIS — M2141 Flat foot [pes planus] (acquired), right foot: Secondary | ICD-10-CM | POA: Diagnosis not present

## 2018-10-21 DIAGNOSIS — E1142 Type 2 diabetes mellitus with diabetic polyneuropathy: Secondary | ICD-10-CM

## 2018-10-21 DIAGNOSIS — I890 Lymphedema, not elsewhere classified: Secondary | ICD-10-CM

## 2018-10-21 DIAGNOSIS — M216X1 Other acquired deformities of right foot: Secondary | ICD-10-CM | POA: Diagnosis not present

## 2018-10-21 DIAGNOSIS — M216X2 Other acquired deformities of left foot: Secondary | ICD-10-CM

## 2018-10-21 DIAGNOSIS — M2142 Flat foot [pes planus] (acquired), left foot: Secondary | ICD-10-CM | POA: Diagnosis not present

## 2018-10-21 LAB — HM DIABETES FOOT EXAM

## 2018-10-21 NOTE — Telephone Encounter (Signed)
Patient orders were clarified as follows; to apply Dynaflex to LLE and use aquasol or silver cell to right lower ext

## 2018-10-21 NOTE — Telephone Encounter (Signed)
Pt called stating that she is still having a lot of pain in both heals when she walks and sits and also a lot of pain in her hands due to the carpal tunnel. Please advise.

## 2018-10-21 NOTE — Patient Instructions (Signed)
Diabetes Mellitus and Foot Care Foot care is an important part of your health, especially when you have diabetes. Diabetes may cause you to have problems because of poor blood flow (circulation) to your feet and legs, which can cause your skin to:  Become thinner and drier.  Break more easily.  Heal more slowly.  Peel and crack. You may also have nerve damage (neuropathy) in your legs and feet, causing decreased feeling in them. This means that you may not notice minor injuries to your feet that could lead to more serious problems. Noticing and addressing any potential problems early is the best way to prevent future foot problems. How to care for your feet Foot hygiene  Wash your feet daily with warm water and mild soap. Do not use hot water. Then, pat your feet and the areas between your toes until they are completely dry. Do not soak your feet as this can dry your skin.  Trim your toenails straight across. Do not dig under them or around the cuticle. File the edges of your nails with an emery board or nail file.  Apply a moisturizing lotion or petroleum jelly to the skin on your feet and to dry, brittle toenails. Use lotion that does not contain alcohol and is unscented. Do not apply lotion between your toes. Shoes and socks  Wear clean socks or stockings every day. Make sure they are not too tight. Do not wear knee-high stockings since they may decrease blood flow to your legs.  Wear shoes that fit properly and have enough cushioning. Always look in your shoes before you put them on to be sure there are no objects inside.  To break in new shoes, wear them for just a few hours a day. This prevents injuries on your feet. Wounds, scrapes, corns, and calluses  Check your feet daily for blisters, cuts, bruises, sores, and redness. If you cannot see the bottom of your feet, use a mirror or ask someone for help.  Do not cut corns or calluses or try to remove them with medicine.  If you  find a minor scrape, cut, or break in the skin on your feet, keep it and the skin around it clean and dry. You may clean these areas with mild soap and water. Do not clean the area with peroxide, alcohol, or iodine.  If you have a wound, scrape, corn, or callus on your foot, look at it several times a day to make sure it is healing and not infected. Check for: ? Redness, swelling, or pain. ? Fluid or blood. ? Warmth. ? Pus or a bad smell. General instructions  Do not cross your legs. This may decrease blood flow to your feet.  Do not use heating pads or hot water bottles on your feet. They may burn your skin. If you have lost feeling in your feet or legs, you may not know this is happening until it is too late.  Protect your feet from hot and cold by wearing shoes, such as at the beach or on hot pavement.  Schedule a complete foot exam at least once a year (annually) or more often if you have foot problems. If you have foot problems, report any cuts, sores, or bruises to your health care provider immediately. Contact a health care provider if:  You have a medical condition that increases your risk of infection and you have any cuts, sores, or bruises on your feet.  You have an injury that is not   healing.  You have redness on your legs or feet.  You feel burning or tingling in your legs or feet.  You have pain or cramps in your legs and feet.  Your legs or feet are numb.  Your feet always feel cold.  You have pain around a toenail. Get help right away if:  You have a wound, scrape, corn, or callus on your foot and: ? You have pain, swelling, or redness that gets worse. ? You have fluid or blood coming from the wound, scrape, corn, or callus. ? Your wound, scrape, corn, or callus feels warm to the touch. ? You have pus or a bad smell coming from the wound, scrape, corn, or callus. ? You have a fever. ? You have a red line going up your leg. Summary  Check your feet every day  for cuts, sores, red spots, swelling, and blisters.  Moisturize feet and legs daily.  Wear shoes that fit properly and have enough cushioning.  If you have foot problems, report any cuts, sores, or bruises to your health care provider immediately.  Schedule a complete foot exam at least once a year (annually) or more often if you have foot problems. This information is not intended to replace advice given to you by your health care provider. Make sure you discuss any questions you have with your health care provider. Document Released: 01/17/2000 Document Revised: 03/03/2017 Document Reviewed: 02/21/2016 Elsevier Patient Education  2020 [employer]   Onychomycosis/Fungal Toenails  WHAT IS IT? An infection that lies within the keratin of your nail plate that is caused by a fungus.  WHY ME? Fungal infections affect all ages, sexes, races, and creeds.  There may be many factors that predispose you to a fungal infection such as age, coexisting medical conditions such as diabetes, or an autoimmune disease; stress, medications, fatigue, genetics, etc.  Bottom line: fungus thrives in a warm, moist environment and your shoes offer such a location.  IS IT CONTAGIOUS? Theoretically, yes.  You do not want to share shoes, nail clippers or files with someone who has fungal toenails.  Walking around barefoot in the same room or sleeping in the same bed is unlikely to transfer the organism.  It is important to realize, however, that fungus can spread easily from one nail to the next on the same foot.  HOW DO WE TREAT THIS?  There are several ways to treat this condition.  Treatment may depend on many factors such as age, medications, pregnancy, liver and kidney conditions, etc.  It is best to ask your doctor which options are available to you.  1. No treatment.   Unlike many other medical concerns, you can live with this condition.  However for many people this can be a painful condition and may lead to  ingrown toenails or a bacterial infection.  It is recommended that you keep the nails cut short to help reduce the amount of fungal nail. 2. Topical treatment.  These range from herbal remedies to prescription strength nail lacquers.  About 40-50% effective, topicals require twice daily application for approximately 9 to 12 months or until an entirely new nail has grown out.  The most effective topicals are medical grade medications available through physicians offices. 3. Oral antifungal medications.  With an 80-90% cure rate, the most common oral medication requires 3 to 4 months of therapy and stays in your system for a year as the new nail grows out.  Oral antifungal medications do require   blood work to make sure it is a safe drug for you.  A liver function panel will be performed prior to starting the medication and after the first month of treatment.  It is important to have the blood work performed to avoid any harmful side effects.  In general, this medication safe but blood work is required. 4. Laser Therapy.  This treatment is performed by applying a specialized laser to the affected nail plate.  This therapy is noninvasive, fast, and non-painful.  It is not covered by insurance and is therefore, out of pocket.  The results have been very good with a 80-95% cure rate.  [facility] is the only practice in the area to offer this therapy. 5. Permanent Nail Avulsion.  Removing the entire nail so that a new nail will not grow back. 

## 2018-10-21 NOTE — Telephone Encounter (Signed)
Pt had question about potassium. I told her we will check levels at her appointment to see if it is needed

## 2018-10-21 NOTE — Telephone Encounter (Signed)
Received call from Tracy-nurse with [facility] needing clarification on wound care orders. The number to contact patient is [phone]

## 2018-10-24 ENCOUNTER — Other Ambulatory Visit: Payer: Self-pay | Admitting: Internal Medicine

## 2018-10-24 NOTE — Telephone Encounter (Signed)
I called pt.  She has appt with her pcp on Friday, she stated that her wound is in the R heel, not left side.  She is having pain in the L side.  Please advise.

## 2018-10-24 NOTE — Telephone Encounter (Signed)
Looks like she saw orthopedic recently, reported continued heel pain at that time worse when standing. She was given rx for oxycodone, she remains on gabapentin 100 mg TID, higher doses caused confusion. She had been doing well at our last office visit 09/20/2018. Please have her discuss with PCP.

## 2018-10-24 NOTE — Telephone Encounter (Signed)
I called pt and she is having increased pain with both heels when standing, but also has when she is sitting.  Also with both wrists, (weakness/ shakiness when holding things).  She did not want surgery.  I relayed that we see her prn, and she is followed by her pcp.  She did not want anything that would hurt her kidneys.

## 2018-10-24 NOTE — Telephone Encounter (Signed)
Have her continue follow-up with pcp as scheduled. At that time could be evaluated if other issues going on with her wound history. We can help in future if needed.

## 2018-10-25 NOTE — Telephone Encounter (Signed)
I spoke to April [name] and relayed that see pcp as scheduled with Dr. Quay Burow on Friday. Evaluate for other issues (relating to wound hx).   If needed in future do not hesitate to call.  She verbalized ok.

## 2018-10-27 ENCOUNTER — Encounter: Payer: Self-pay | Admitting: Internal Medicine

## 2018-10-27 NOTE — Progress Notes (Signed)
Subjective: April [name] is seen today for preventative diabetic foot care follow up of callus lateral aspect of left foot and painful, elongated, thickened toenails 1-5 b/l feet that she cannot cut. Pain interferes with daily activities. Aggravating factor includes wearing enclosed shoe gear and relieved with periodic debridement.  Her husband is present during the visit. She was hospitalized again and went to rehab facility. She continues to see Dr. Quay Burow for wound RLE.   She also has appointment to pick up her diabetic shoes on today.  Current Outpatient Medications on File Prior to Visit  Medication Sig  . albuterol (PROVENTIL) (2.5 MG/3ML) 0.083% nebulizer solution Take 3 mLs (2.5 mg total) by nebulization every 6 (six) hours as needed for wheezing or shortness of breath.  . allopurinol (ZYLOPRIM) 100 MG tablet Take 1 tablet (100 mg total) by mouth daily.  Marland Kitchen aspirin (ECOTRIN LOW STRENGTH) 81 MG EC tablet Take 81 mg by mouth at bedtime.   . budesonide-formoterol (SYMBICORT) 160-4.5 MCG/ACT inhaler one - two inhalations every 12 hours; gargle and spit after use (Patient taking differently: Inhale 2 puffs into the lungs 2 (two) times daily as needed (shortness of breath). gargle and spit after use)  . calcium carbonate (TUMS - DOSED IN MG ELEMENTAL CALCIUM) 500 MG chewable tablet Chew 2 tablets by mouth daily as needed for indigestion or heartburn.  . carvedilol (COREG) 25 MG tablet TAKE 1 TABLET BY MOUTH TWICE DAILY WITH A MEAL (Patient taking differently: Take 25 mg by mouth daily. )  . colchicine 0.6 MG tablet Take 1 tablet (0.6 mg total) by mouth daily.  . collagenase (SANTYL) ointment Apply topically daily.  . cyanocobalamin (,VITAMIN B-12,) 1000 MCG/ML injection INJECT 1ML INTO MUSCLE EVERY 30 DAYS (Patient taking differently: Inject 1,000 mcg into the muscle every 30 (thirty) days. )  . diclofenac sodium (VOLTAREN) 1 % GEL Apply 2 g topically 4 (four) times daily. (Patient taking  differently: Apply 2 g topically 4 (four) times daily as needed (pain). )  . EPINEPHrine 0.3 mg/0.3 mL IJ SOAJ injection Inject 0.3 mLs (0.3 mg total) into the muscle once as needed for anaphylaxis.  . famotidine (PEPCID) 20 MG tablet Take 1 tablet (20 mg total) by mouth 2 (two) times daily.  . folic acid (FOLVITE) 841 MCG tablet Take 400 mcg by mouth at bedtime.   . furosemide (LASIX) 80 MG tablet 1.5 tablets in morning, 1 tablet in afternoon (Patient taking differently: Take 80-120 mg by mouth 2 (two) times daily. 1.5 tablets in morning, 1 tablet in afternoon)  . gabapentin (NEURONTIN) 100 MG capsule Take 1 capsule (100 mg total) by mouth 3 (three) times daily.  . hydrOXYzine (ATARAX/VISTARIL) 25 MG tablet Take 25 mg by mouth every 6 (six) hours as needed for itching.   . montelukast (SINGULAIR) 10 MG tablet Take 1 tablet (10 mg total) by mouth at bedtime.  . Omega-3 Fatty Acids (FISH OIL) 1000 MG CAPS Take 1,000 mg by mouth daily.   . Oxcarbazepine (TRILEPTAL) 300 MG tablet Take 1 tablet (300 mg total) by mouth 2 (two) times daily.  Marland Kitchen oxyCODONE-acetaminophen (PERCOCET) 5-325 MG tablet Take 1 tablet by mouth every 8 (eight) hours as needed for moderate pain or severe pain.  Marland Kitchen permethrin (ELIMITE) 5 % cream Apply 1 application topically as needed. Hives and rash on arms and chest  . silver sulfADIAZINE (SILVADENE) 1 % cream Apply 1 application topically daily.  . simvastatin (ZOCOR) 40 MG tablet Take 1 tablet (40 mg  total) by mouth at bedtime.   No current facility-administered medications on file prior to visit.      Allergies  Allergen Reactions  . Penicillins Rash and Other (See Comments)    She was told not to take it anymore. Has patient had a PCN reaction causing immediate rash, facial/tongue/throat swelling, SOB or lightheadedness with hypotension: Yes Has patient had a PCN reaction causing severe rash involving mucus membranes or skin necrosis: No Has patient had a PCN reaction that  required hospitalization: No Has patient had a PCN reaction occurring within the last 10 years: No If all of the above answers are "NO", then may proceed with Cephalosporin use.'  . Shellfish Allergy Anaphylaxis  . Sulfonamide Derivatives Anaphylaxis and Other (See Comments)    REACTION: internal "burning"  . Hydrochlorothiazide W-Triamterene Other (See Comments)    Hypokalemia  . Lotensin [Benazepril Hcl] Hives  . Dipyridamole Other (See Comments)    Unknown reaction  . Estrogens Other (See Comments)    Unknown reaction  . Hydrochlorothiazide Other (See Comments)    Unknown reaction  . Metronidazole Other (See Comments)    Unknown reaction  . Other     Pickles-itching  . Spironolactone Other (See Comments)    UNSPECIFIED > "kidney problems"  . Sulfa Antibiotics Other (See Comments)    Unknown reaction  . Torsemide Other (See Comments)    Unknown reaction  . Valsartan Other (See Comments)    Unknown reaction  . Madelaine Bhat Isothiocyanate] Itching   Objective:  Vascular Examination: Capillary refill time <5 seconds x 10 digits.  Dorsalis pedis and posterior tibial pulses nonpalpable b/l.  Digital hair absent x 10 digits.  Skin temperature gradient WNL b/l.   Lymphedema b/l LE.  Dermatological Examination: Dressing RLE is clean, dry and intact.  Skin with normal turgor, texture and tone LLE.  Toenails 1-5 b/l discolored, thick, dystrophic with subungual debris and pain with palpation to nailbeds due to thickness of nails.  Hyperkeratotic lesion submet head 5 left and lateral aspect of left foot with tenderness to palpation. No edema, no erythema, no drainage, no flocculence.  Musculoskeletal: Muscle strength 4/5 to all LE muscle groups b/l.  Wheel chair bound.   Pes planus b/l LE.  Right 5th digit hammertoe.   No pain, crepitus or joint limitation noted with ROM.   Neurological Examination: Protective sensation diminished with 10 gram monofilament  bilaterally.  Assessment: Painful onychomycosis toenails 1-5 b/l  Callus left foot NIDDM with diabetic neuropathy Lymphedema b/l LE  Plan: 1. Toenails 1-5 b/l were debrided in length and girth without iatrogenic bleeding. Calluses pared submetatarsal head 5 left and latera aspect left foot utilizing sterile scalpel blade without incident. 2. Patient to continue soft, supportive shoe gear. 3. Patient to report any pedal injuries to medical professional immediately. 4. Follow up 3 months.  5. Patient/POA to call should there be a concern in the interim.

## 2018-10-27 NOTE — Progress Notes (Signed)
Subjective:    Patient ID: April [name], female    DOB: 03-12-1950, 68 y.o.   MRN: [id number]  HPI The patient is here for follow up from the hospital/SNF.  Admitted to hospital 09/07/18-09/15/18 for right > left side pain in heel.  In the ED xray of the left ankle showed possible osteomyelitis involving left calcaneus.  She was started on IV antibiotics.  Orthopedics was consulted.    Left calcaneus lytic changes, achilles tear: Initially started on Broad-spectrum antibiotics with vancomycin and aztreonam for possible osteomyelitis Dr. [name] evaluated patient he did not believe there was osteomyelitis-recommended PT and weightbearing as tolerated Silvadene/Santyl dressing changes and treatment of her right lower extremity wound Antibiotics discontinued 8/6 Dr. [name] recommended outpatient follow-up  Acute gout flare - presumed: She is on colchicine-has never been formally diagnosed with gout Multiple joint pains including bilateral knees and ankle Uric acid 12.5 down to 11 Started on prednisone 40 mg daily tapering  Type 2 diabetes-complications of neuropathy bilateral lower extremities: Diet controlled A1c 6.0 Was on insulin sliding scale during hospitalization  Chronic kidney disease stage III Creatinine appeared stable Lasix 120 mg a.m., 80 mg p.m.  Right leg ulceration, chronic Wound care at home 3 times a week Continued Silvadene/Santyl dressings changes  Hypertension: Controlled Continue carvedilol, spironolactone  COPD/asthma: Stable Continued on montelukast, Dulera, albuterol as needed  Chronic peripheral neuropathy secondary likely to underlying diabetes and low back pain: Gabapentin 100 mg 3 times daily and Trileptal 300 mg twice daily Cannot escalate gabapentin due to complications in the past with confusion  Morbid obesity, BMI 60: She has been advised multiple times to work on weight loss    She is here today with her husband.  She is crying  intermittently throughout the visit from pain from her feet/neuropathy.   Peripheral neuropathy:  She is taking the gabapentin and trileptal as prescribed.  She has been following with neurology and they did not have any further advise regarding treatment.  She has been on lyrica and cymbalta in the past and the lyrica cause side effects.  Her pain is not controlled and is severe.  She does take percocet on occasion and it may help a little.    Hypertension: She is taking her medication daily. She is compliant with a low sodium diet.  She denies chest pain, palpitations,  and regular headaches.      Leg ulcer, chronic venous insuffiencey:  She is taking her lasix as prescribed.  Her leg swelling is worse.  Her leg ulcer is healing slowly - she has someone coming to her house for wound care.  She follows with Dr [name].    Diabetes: She is controlling her sugars with diet. She is fairly compliant with a diabetic diet.       Medications and allergies reviewed with patient and updated if appropriate.  Patient Active Problem List   Diagnosis Date Noted  . Achilles tendon sprain, left, initial encounter   . Osteomyelitis of ankle or foot, acute, left ([place]) 09/07/2018  . Osteomyelitis of ankle or foot, left, acute ([place]) 09/07/2018  . Intractable pain 08/17/2018  . Acute on chronic kidney failure ([place]) 08/15/2018  . Onychomycosis 05/19/2018  . Idiopathic chronic venous hypertension of right lower extremity with ulcer and inflammation ([place]) 05/19/2018  . Traumatic wound dehiscence   . Skin lumps 01/14/2018  . Bilateral leg cramps 12/14/2017  . Dry skin dermatitis 06/29/2017  . Left shoulder pain 08/14/2016  . Meralgia  paresthetica 07/16/2016  . Chronic left-sided low back pain with left-sided sciatica 06/02/2016  . Whole body pain 03/20/2016  . Diabetic peripheral neuropathy ([place]) 03/20/2016  . Osteopenia 01/11/2016  . CKD (chronic kidney disease) stage 3, GFR 30-59 ml/min ([facility]) 01/02/2016  .  Hand paresthesia 07/18/2015  . Carpal tunnel syndrome 06/07/2015  . Family history of colon cancer   . Diabetes mellitus with neurological manifestations ([place]) 04/18/2015  . Bilateral leg edema 04/11/2015  . Abnormality of gait 01/03/2015  . Hereditary and idiopathic peripheral neuropathy 01/03/2015  . GERD (gastroesophageal reflux disease) 06/17/2014  . Hx of colonic polyps 12/15/2012  . Vitamin B12 deficiency 11/03/2012  . Intrinsic asthma 03/23/2012  . Chronic diastolic heart failure ([place]) 02/20/2011  . OSA (obstructive sleep apnea) 09/17/2010  . URINARY URGENCY 01/08/2010  . CAD, NATIVE VESSEL 11/20/2008  . Osteoarthritis of knees, bilateral 06/14/2008  . Hyperlipidemia 05/10/2007  . Essential hypertension 01/18/2007  . HYPOKALEMIA 04/30/2006  . Morbid (severe) obesity due to excess calories ([place]) 04/30/2006  . HX, PERSONAL, PEPTIC ULCER DISEASE 04/30/2006    Current Outpatient Medications on File Prior to Visit  Medication Sig Dispense Refill  . albuterol (PROVENTIL) (2.5 MG/3ML) 0.083% nebulizer solution Take 3 mLs (2.5 mg total) by nebulization every 6 (six) hours as needed for wheezing or shortness of breath. 75 mL 12  . allopurinol (ZYLOPRIM) 100 MG tablet Take 1 tablet (100 mg total) by mouth daily. 90 tablet 1  . aspirin (ECOTRIN LOW STRENGTH) 81 MG EC tablet Take 81 mg by mouth at bedtime.     . budesonide-formoterol (SYMBICORT) 160-4.5 MCG/ACT inhaler one - two inhalations every 12 hours; gargle and spit after use (Patient taking differently: Inhale 2 puffs into the lungs 2 (two) times daily as needed (shortness of breath). gargle and spit after use) 1 Inhaler 4  . calcium carbonate (TUMS - DOSED IN MG ELEMENTAL CALCIUM) 500 MG chewable tablet Chew 2 tablets by mouth daily as needed for indigestion or heartburn.    . carvedilol (COREG) 25 MG tablet TAKE 1 TABLET BY MOUTH TWICE DAILY WITH A MEAL (Patient taking differently: Take 25 mg by mouth daily. ) 60 tablet 5  .  collagenase (SANTYL) ointment Apply topically daily. 15 g 0  . cyanocobalamin (,VITAMIN B-12,) 1000 MCG/ML injection INJECT 1ML INTO MUSCLE EVERY 30 DAYS (Patient taking differently: Inject 1,000 mcg into the muscle every 30 (thirty) days. ) 10 mL 0  . diclofenac sodium (VOLTAREN) 1 % GEL Apply 2 g topically 4 (four) times daily. (Patient taking differently: Apply 2 g topically 4 (four) times daily as needed (pain). ) 100 g 11  . EPINEPHrine 0.3 mg/0.3 mL IJ SOAJ injection Inject 0.3 mLs (0.3 mg total) into the muscle once as needed for anaphylaxis. 2 mL 1  . famotidine (PEPCID) 20 MG tablet Take 1 tablet (20 mg total) by mouth 2 (two) times daily. 329 tablet 3  . folic acid (FOLVITE) 518 MCG tablet Take 400 mcg by mouth at bedtime.     . furosemide (LASIX) 80 MG tablet 1.5 tablets in morning, 1 tablet in afternoon (Patient taking differently: Take 80-120 mg by mouth 2 (two) times daily. 1.5 tablets in morning, 1 tablet in afternoon) 225 tablet 3  . hydrOXYzine (ATARAX/VISTARIL) 25 MG tablet Take 25 mg by mouth every 6 (six) hours as needed for itching.     . montelukast (SINGULAIR) 10 MG tablet Take 1 tablet (10 mg total) by mouth at bedtime. 30 tablet 5  .  Omega-3 Fatty Acids (FISH OIL) 1000 MG CAPS Take 1,000 mg by mouth daily.     . Oxcarbazepine (TRILEPTAL) 300 MG tablet Take 1 tablet (300 mg total) by mouth 2 (two) times daily. 60 tablet 0  . oxyCODONE-acetaminophen (PERCOCET) 5-325 MG tablet Take 1 tablet by mouth every 8 (eight) hours as needed for moderate pain or severe pain. 15 tablet 0  . permethrin (ELIMITE) 5 % cream Apply 1 application topically as needed. Hives and rash on arms and chest    . silver sulfADIAZINE (SILVADENE) 1 % cream Apply 1 application topically daily. 400 g 0  . simvastatin (ZOCOR) 40 MG tablet Take 1 tablet (40 mg total) by mouth at bedtime. 90 tablet 1  . spironolactone (ALDACTONE) 25 MG tablet TAKE 1 TABLET BY MOUTH ONCE DAILY 90 tablet 1  . gabapentin  (NEURONTIN) 100 MG capsule Take 1 capsule (100 mg total) by mouth 3 (three) times daily. 90 capsule 0   No current facility-administered medications on file prior to visit.     Past Medical History:  Diagnosis Date  . Anemia   . Asthma   . CAD (coronary artery disease)   . Carpal tunnel syndrome   . Cellulitis of both lower extremities 04/11/2015  . CHF (congestive heart failure) ([place])   . Chronic kidney disease    [facility]- followed by [facility]  . Colon polyp, hyperplastic 2007 & 2012  . Complication of anesthesia 1999   svt with renal calculi surgery, no problems since  . CTS (carpal tunnel syndrome)    bilateral  . Diabetes mellitus   . Eczema   . GERD (gastroesophageal reflux disease)   . History of kidney stones 1999  . Hyperlipidemia   . Hypertension   . Leg ulcer ([place]) 04/24/2015   Right lateral leg No evidence of an infection Monitor closely Keep edema controlled   . Leg ulcer ([place])    right lower  . Meralgia paresthetica    Dr. [name]  . Morbid obesity ([place])   . Morbid obesity ([place])   . Neuropathy    toes and legs  . Osteoarthrosis, unspecified whether generalized or localized, lower leg    knee  . PUD (peptic ulcer disease)   . Shortness of breath dyspnea    with exertion  . Sleep apnea    per progress note 02/25/2018  . Type II or unspecified type diabetes mellitus without mention of complication, not stated as uncontrolled   . Unspecified hereditary and idiopathic peripheral neuropathy   . Urticaria   . Vitamin B12 deficiency   . Wears glasses   . Wound cellulitis    right upper leg, healing well    Past Surgical History:  Procedure Laterality Date  . ABDOMINAL HYSTERECTOMY    . CARDIAC CATHETERIZATION  2002   non obstructive disease  . colonoscopy with polypectomy  2007 & 2012    hyperplastic ;Dr Watt Climes  . COLONOSCOPY WITH PROPOFOL N/A 06/04/2015   Procedure: COLONOSCOPY WITH PROPOFOL;  Surgeon: Jerene Bears, MD;  Location: [facility];  Service:  Gastroenterology;  Laterality: N/A;  . DEBRIDEMENT LEG Right 03/02/2018   WOUND VAC APPLIED  . DEBRIDEMENT LEG Right 05/27/2018   RIGHT LOWER LEG DEBRIDEMENT,  SKIN GRAFT, VAC PLACEMENT   . DILATION AND CURETTAGE OF UTERUS     multiple  . HEMORRHOID SURGERY    . I&D EXTREMITY Right 03/02/2018   Procedure: RIGHT LEG DEBRIDEMENT AND PLACE VAC;  Surgeon: [name], MD;  Location: [facility];  Service: Orthopedics;  Laterality: Right;  . I&D EXTREMITY Right 03/04/2018   Procedure: REPEAT IRRIGATION AND DEBRIDEMENT RIGHT LEG, PLACE WOUND VAC;  Surgeon: [name], MD;  Location: [facility];  Service: Orthopedics;  Laterality: Right;  . I&D EXTREMITY Right 03/30/2018   Procedure: IRRIGATION AND DEBRIDEMENT RIGHT LEG, APPLY WOUND VAC;  Surgeon: [name], MD;  Location: [facility];  Service: Orthopedics;  Laterality: Right;  . I&D EXTREMITY Right 05/27/2018   Procedure: RIGHT LOWER LEG DEBRIDEMENT,  SKIN GRAFT, VAC PLACEMENT;  Surgeon: [name], MD;  Location: [facility];  Service: Orthopedics;  Laterality: Right;  RIGHT LOWER LEG DEBRIDEMENT,  SKIN GRAFT, VAC PLACEMENT  . renal calculi  12/1997   SVT with induction of anesthesia  . right knee arthroscopy    . SKIN SPLIT GRAFT Right 03/04/2018   Procedure: POSSIBLE SPLIT THICKNESS SKIN GRAFT;  Surgeon: [name], MD;  Location: [facility];  Service: Orthopedics;  Laterality: Right;  . SKIN SPLIT GRAFT Right 04/01/2018   Procedure: REPEAT IRRIGATION AND DEBRIDEMENT RIGHT LEG, APPLY SPLIT THICKNESS SKIN GRAFT;  Surgeon: [name], MD;  Location: [facility];  Service: Orthopedics;  Laterality: Right;  . TONSILLECTOMY AND ADENOIDECTOMY      Social History   Socioeconomic History  . Marital status: Married    Spouse name: [name]  . Number of children: 1  . Years of education: BS  . Highest education level: Not on file  Occupational History  . Occupation: Disabled    Employer: RETIRED  Social Needs  . Financial resource strain: Not on file  .  Food insecurity    Worry: Not on file    Inability: Not on file  . Transportation needs    Medical: Not on file    Non-medical: Not on file  Tobacco Use  . Smoking status: Never Smoker  . Smokeless tobacco: Never Used  Substance and Sexual Activity  . Alcohol use: No    Alcohol/week: 0.0 standard drinks  . Drug use: No  . Sexual activity: Not on file  Lifestyle  . Physical activity    Days per week: Not on file    Minutes per session: Not on file  . Stress: Not on file  Relationships  . Social Herbalist on phone: Not on file    Gets together: Not on file    Attends religious service: Not on file    Active member of club or organization: Not on file    Attends meetings of clubs or organizations: Not on file    Relationship status: Not on file  Other Topics Concern  . Not on file  Social History Narrative   Patient lives at home with her husband [name] Patient is retired and has a Conservation officer, nature.    Caffeine - some times.   Right handed.    Family History  Problem Relation Age of Onset  . Colon cancer Mother   . Prostate cancer Father   . Colon cancer Father   . Diabetes Maternal Aunt   . Breast cancer Maternal Aunt   . Diabetes Maternal Uncle   . Diabetes Paternal Aunt   . Stroke Paternal Aunt        > 65  . Heart disease Paternal Aunt   . Diabetes Paternal Uncle   . Breast cancer Maternal Aunt         X 2  . Breast cancer Cousin  Review of Systems  Constitutional: Negative for chills and fever.  Respiratory: Positive for cough, shortness of breath (no more usual) and wheezing.   Cardiovascular: Positive for leg swelling. Negative for chest pain and palpitations.  Neurological: Positive for light-headedness (if she gets up too quick). Negative for headaches.       Objective:   Vitals:   10/28/18 1332  BP: (!) 150/80  Pulse: 88  Temp: 98.8 F (37.1 C)  SpO2: 98%   BP Readings from Last 3 Encounters:  10/28/18 (!) 150/80   09/20/18 124/72  09/15/18 (!) 112/55   Wt Readings from Last 3 Encounters:  10/28/18 (!) 348 lb (157.9 kg)  09/20/18 (!) 348 lb 3.2 oz (157.9 kg)  09/19/18 (!) 352 lb 4.8 oz (159.8 kg)   Body mass index is 59.73 kg/m.   Physical Exam    Constitutional: Appears well-developed and well-nourished. No distress.  HENT:  Head: Normocephalic and atraumatic.  Neck: Neck supple. No tracheal deviation present. No thyromegaly present.  No cervical lymphadenopathy Cardiovascular: Normal rate, regular rhythm and normal heart sounds.   No murmur heard. No carotid bruit .  Legs wrapped - unable to assess edema well Pulmonary/Chest: Effort normal and breath sounds normal. No respiratory distress. No has no wheezes. No rales.  Skin: Skin is warm and dry. Not diaphoretic.  Psychiatric: Normal mood and affect. Behavior is normal.      Assessment & Plan:    See Problem List for Assessment and Plan of chronic medical problems.

## 2018-10-28 ENCOUNTER — Other Ambulatory Visit (INDEPENDENT_AMBULATORY_CARE_PROVIDER_SITE_OTHER): Payer: Medicare Other

## 2018-10-28 ENCOUNTER — Ambulatory Visit (INDEPENDENT_AMBULATORY_CARE_PROVIDER_SITE_OTHER): Payer: Medicare Other | Admitting: Internal Medicine

## 2018-10-28 ENCOUNTER — Encounter: Payer: Self-pay | Admitting: Internal Medicine

## 2018-10-28 ENCOUNTER — Ambulatory Visit: Payer: Medicare Other

## 2018-10-28 ENCOUNTER — Other Ambulatory Visit: Payer: Self-pay

## 2018-10-28 VITALS — BP 150/80 | HR 88 | Temp 98.8°F | Ht 64.0 in | Wt 348.0 lb

## 2018-10-28 DIAGNOSIS — I10 Essential (primary) hypertension: Secondary | ICD-10-CM

## 2018-10-28 DIAGNOSIS — Z23 Encounter for immunization: Secondary | ICD-10-CM | POA: Diagnosis not present

## 2018-10-28 DIAGNOSIS — G609 Hereditary and idiopathic neuropathy, unspecified: Secondary | ICD-10-CM | POA: Diagnosis not present

## 2018-10-28 DIAGNOSIS — I87331 Chronic venous hypertension (idiopathic) with ulcer and inflammation of right lower extremity: Secondary | ICD-10-CM

## 2018-10-28 DIAGNOSIS — E1140 Type 2 diabetes mellitus with diabetic neuropathy, unspecified: Secondary | ICD-10-CM | POA: Diagnosis not present

## 2018-10-28 DIAGNOSIS — L97919 Non-pressure chronic ulcer of unspecified part of right lower leg with unspecified severity: Secondary | ICD-10-CM

## 2018-10-28 LAB — BASIC METABOLIC PANEL
BUN: 30 mg/dL — ABNORMAL HIGH (ref 6–23)
CO2: 33 mEq/L — ABNORMAL HIGH (ref 19–32)
Calcium: 9.4 mg/dL (ref 8.4–10.5)
Chloride: 97 mEq/L (ref 96–112)
Creatinine, Ser: 1.78 mg/dL — ABNORMAL HIGH (ref 0.40–1.20)
GFR: 34.26 mL/min — ABNORMAL LOW (ref >60.00)
Glucose, Bld: 99 mg/dL (ref 70–99)
Potassium: 3.6 mEq/L (ref 3.5–5.1)
Sodium: 139 mEq/L (ref 135–145)

## 2018-10-28 MED ORDER — COLCHICINE 0.6 MG PO TABS: 0.6000 mg | ORAL_TABLET | Freq: Every day | ORAL | 1 refills | Status: DC

## 2018-10-28 NOTE — Assessment & Plan Note (Signed)
Diet controlled a1c controlled

## 2018-10-28 NOTE — Assessment & Plan Note (Signed)
Following with neurology Taking gabapentin 100 mg TID - higher dose has caused confusion On trileptal Pain not controlled Not sure what else will help Has increased leg edema - will check cmp - if GFR stable can consider higher dose of lasix to see if decreasing some of the edema helps her pain Requested referral to pain clinic - will refer

## 2018-10-28 NOTE — Assessment & Plan Note (Signed)
BP elevated here today, but that is likely from being in pain BP typically well controlled Continue current medications

## 2018-10-28 NOTE — Assessment & Plan Note (Signed)
Following with Dr [name] Has nurse coming to home for wound care Continue lasix - may give higher dose x 1 to help improve increased swelling Bmp today

## 2018-10-28 NOTE — Patient Instructions (Addendum)
  Tests ordered today. Your results will be released to [place] (or called to you) after review.  If any changes need to be made, you will be notified at that same time.  Flu immunization administered today.    Medications reviewed and updated.  Changes include :   none  A referral was ordered for pain management.   Please followup in 6 months

## 2018-10-31 ENCOUNTER — Telehealth: Payer: Self-pay | Admitting: Orthopedic Surgery

## 2018-10-31 ENCOUNTER — Telehealth: Payer: Self-pay | Admitting: Internal Medicine

## 2018-10-31 NOTE — Telephone Encounter (Signed)
Tracy from [employer] called. She is her wound care nurse. She says patient is in a lot of pain. Would like to know if there is another compression dressing that she could use. It is difficult for patient to lift her L leg up. Her call back number is [phone]

## 2018-10-31 NOTE — Telephone Encounter (Signed)
Pt would like to know if she should be taking potassium based on recent lab work.

## 2018-10-31 NOTE — Telephone Encounter (Signed)
Based on her last blood work she does need some potassium because it was on the low side of normal and too close to being low.  She should restart the potassium.

## 2018-10-31 NOTE — Telephone Encounter (Signed)
Copied from [place] [phone]. Topic: General - Other >> Oct 31, 2018  8:23 AM Keene Breath wrote: Reason for CRM: Patient would like the nurse to call her regarding her potassium medication and she has a few questions regarding her husband's medication as well.  She stated she forgot to ask the doctor at her last visit.  CB# [phone]

## 2018-11-01 NOTE — Telephone Encounter (Signed)
[name] please advise, thank you.  

## 2018-11-01 NOTE — Telephone Encounter (Signed)
Will take potassium

## 2018-11-01 NOTE — Telephone Encounter (Signed)
LVM for pt to call back in regards.  

## 2018-11-03 ENCOUNTER — Ambulatory Visit (INDEPENDENT_AMBULATORY_CARE_PROVIDER_SITE_OTHER): Payer: Medicare Other | Admitting: Orthopedic Surgery

## 2018-11-03 ENCOUNTER — Other Ambulatory Visit: Payer: Self-pay

## 2018-11-03 DIAGNOSIS — I87331 Chronic venous hypertension (idiopathic) with ulcer and inflammation of right lower extremity: Secondary | ICD-10-CM

## 2018-11-03 DIAGNOSIS — L97919 Non-pressure chronic ulcer of unspecified part of right lower leg with unspecified severity: Secondary | ICD-10-CM

## 2018-11-03 MED ORDER — OXYCODONE-ACETAMINOPHEN 5-325 MG PO TABS: 1.0000 | ORAL_TABLET | Freq: Three times a day (TID) | ORAL | 0 refills | Status: DC | PRN

## 2018-11-04 ENCOUNTER — Telehealth: Payer: Self-pay | Admitting: Orthopedic Surgery

## 2018-11-04 ENCOUNTER — Other Ambulatory Visit: Payer: Medicare Other | Admitting: Orthotics

## 2018-11-04 NOTE — Telephone Encounter (Signed)
FYI---- I called and advised [facility] that the pt will have BLE wrapped with three layer compression on the same schedule she is on now. She will follow up in the office on 11/17/18 any changes that need to come after that visit we will call to advise.

## 2018-11-04 NOTE — Telephone Encounter (Signed)
Dorian Pod from [facility] called stating that she had questions about the orders for his wound care.  CB#[phone].  Thank you.

## 2018-11-07 ENCOUNTER — Other Ambulatory Visit: Payer: Self-pay | Admitting: Internal Medicine

## 2018-11-07 MED ORDER — OXCARBAZEPINE 300 MG PO TABS: 300.0000 mg | ORAL_TABLET | Freq: Two times a day (BID) | ORAL | 0 refills | Status: DC

## 2018-11-07 MED ORDER — FUROSEMIDE 80 MG PO TABS: 80.0000 mg | ORAL_TABLET | Freq: Two times a day (BID) | ORAL | 1 refills | Status: DC

## 2018-11-07 MED ORDER — GABAPENTIN 100 MG PO CAPS: 100.0000 mg | ORAL_CAPSULE | Freq: Three times a day (TID) | ORAL | 0 refills | Status: DC

## 2018-11-07 NOTE — Telephone Encounter (Signed)
Pt needs refills on oxcarbazepine 300 mg twice a day,fursemide 80 mg pt takes  1.5 pills twice a day and gabapentin 100 mg three times a day. These medications was prescribed while pt was in hospital. [facility] [address]. Pt does not want an appt pt is having muscle discomfort in the fold hip area this started on Thursday. Pt would like would like medication

## 2018-11-07 NOTE — Telephone Encounter (Signed)
Please advise since you were not the last prescriber for the muscle relaxer and oxycarbazepine.

## 2018-11-08 ENCOUNTER — Encounter: Payer: Self-pay | Admitting: Orthopedic Surgery

## 2018-11-08 NOTE — Progress Notes (Signed)
Office Visit Note   Patient: April [name]           Date of Birth: 1950/11/02           MRN: [id number] Visit Date: 11/03/2018              Requested by: [name], MD [place],  [place] [address] PCP: [name], MD  Chief Complaint  Patient presents with  . Right Lower Leg - Wound Check, Follow-up      HPI: Patient is a 68 year old woman with chronic venous ulceration right lower extremity patient is status post debridement and skin grafting in April.  Patient states the compression wraps lie down her legs she has having home health nursing change the dressings.  Assessment & Plan: Visit Diagnoses:  1. Idiopathic chronic venous hypertension of right lower extremity with ulcer and inflammation (HCC)     Plan: Plan: Recommended continue with compression wraps Dynaflex were applied to both lower extremities  Follow-Up Instructions: Return in about 2 weeks (around 11/17/2018).   Ortho Exam  Patient is alert, oriented, no adenopathy, well-dressed, normal affect, normal respiratory effort. Examination patient's right leg wound is 2 x 3 cm this shows excellent improvement there is good flat granulation tissue no signs of infection no drainage.  Patient cries when trying to lift her leg to evaluate her leg circumferentially.  There are no ulcers or calluses on the heel.  Imaging: No results found. No images are attached to the encounter.  Labs: Lab Results  Component Value Date   HGBA1C 6.0 (H) 09/08/2018   HGBA1C 6.4 07/15/2018   HGBA1C 6.1 (H) 05/27/2018   ESRSEDRATE 42 (H) 08/12/2018   ESRSEDRATE 56 (H) 03/09/2018   ESRSEDRATE 13 06/02/2016   CRP 6.3 (H) 03/09/2018   CRP 2.2 06/02/2016   LABURIC 10.5 (H) 09/13/2018   LABURIC 11.0 (H) 09/11/2018   LABURIC 12.5 (H) 09/08/2018   REPTSTATUS 08/20/2018 FINAL 08/15/2018   GRAMSTAIN  05/27/2018    FEW WBC PRESENT, PREDOMINANTLY PMN MODERATE GRAM POSITIVE RODS RARE GRAM POSITIVE COCCI IN PAIRS     CULT  08/15/2018    NO GROWTH 5 DAYS Performed at [facility], [place] [address] [place], [place] [address]    LABORGA METHICILLIN RESISTANT STAPHYLOCOCCUS AUREUS 05/27/2018     Lab Results  Component Value Date   ALBUMIN 2.8 (L) 09/15/2018   ALBUMIN 3.3 (L) 08/15/2018   ALBUMIN 3.7 07/15/2018   LABURIC 10.5 (H) 09/13/2018   LABURIC 11.0 (H) 09/11/2018   LABURIC 12.5 (H) 09/08/2018    Lab Results  Component Value Date   MG 2.1 09/12/2018   MG 2.1 08/20/2018   MG 2.1 08/18/2018   Lab Results  Component Value Date   VD25OH 47.2 06/02/2016   VD25OH 42 01/23/2011    No results found for: PREALBUMIN CBC EXTENDED Latest Ref Rng & Units 09/15/2018 09/13/2018 09/08/2018  WBC 4.0 - 10.5 K/uL 5.9 5.3 4.0  RBC 3.87 - 5.11 MIL/uL 3.84(L) 3.59(L) 3.56(L)  HGB 12.0 - 15.0 g/dL 11.3(L) 10.3(L) 10.3(L)  HCT 36.0 - 46.0 % 36.5 32.9(L) 33.3(L)  PLT 150 - 400 K/uL 190 165 134(L)  NEUTROABS 1.7 - 7.7 K/uL 3.8 - -  LYMPHSABS 0.7 - 4.0 K/uL 1.4 - -     Body mass index is 59.73 kg/m.  Orders:  No orders of the defined types were placed in this encounter.  Meds ordered this encounter  Medications  . oxyCODONE-acetaminophen (  PERCOCET) 5-325 MG tablet    Sig: Take 1 tablet by mouth every 8 (eight) hours as needed for moderate pain or severe pain.    Dispense:  15 tablet    Refill:  0     Procedures: No procedures performed  Clinical Data: No additional findings.  ROS:  All other systems negative, except as noted in the HPI. Review of Systems  Objective: Vital Signs: Ht 5\' 4"  (1.626 m)   Wt (!) 348 lb (157.9 kg)   BMI 59.73 kg/m   Specialty Comments:  No specialty comments available.  PMFS History: Patient Active Problem List   Diagnosis Date Noted  . Achilles tendon sprain, left, initial encounter   . Osteomyelitis of ankle or foot, acute, left ([place]) 09/07/2018  . Osteomyelitis of ankle or foot, left, acute ([place]) 09/07/2018  . Acute on chronic kidney failure  ([place]) 08/15/2018  . Onychomycosis 05/19/2018  . Idiopathic chronic venous hypertension of right lower extremity with ulcer and inflammation ([place]) 05/19/2018  . Traumatic wound dehiscence   . Skin lumps 01/14/2018  . Bilateral leg cramps 12/14/2017  . Left shoulder pain 08/14/2016  . Meralgia paresthetica 07/16/2016  . Chronic left-sided low back pain with left-sided sciatica 06/02/2016  . Whole body pain 03/20/2016  . Diabetic peripheral neuropathy ([place]) 03/20/2016  . Osteopenia 01/11/2016  . CKD (chronic kidney disease) stage 3, GFR 30-59 ml/min (HCC) 01/02/2016  . Hand paresthesia 07/18/2015  . Carpal tunnel syndrome 06/07/2015  . Family history of colon cancer   . Diabetes mellitus with neurological manifestations ([place]) 04/18/2015  . Bilateral leg edema 04/11/2015  . Abnormality of gait 01/03/2015  . Hereditary and idiopathic peripheral neuropathy 01/03/2015  . GERD (gastroesophageal reflux disease) 06/17/2014  . Hx of colonic polyps 12/15/2012  . Vitamin B12 deficiency 11/03/2012  . Intrinsic asthma 03/23/2012  . Chronic diastolic heart failure ([place]) 02/20/2011  . OSA (obstructive sleep apnea) 09/17/2010  . URINARY URGENCY 01/08/2010  . CAD, NATIVE VESSEL 11/20/2008  . Osteoarthritis of knees, bilateral 06/14/2008  . Hyperlipidemia 05/10/2007  . Essential hypertension 01/18/2007  . Morbid (severe) obesity due to excess calories ([place]) 04/30/2006  . HX, PERSONAL, PEPTIC ULCER DISEASE 04/30/2006   Past Medical History:  Diagnosis Date  . Anemia   . Asthma   . CAD (coronary artery disease)   . Carpal tunnel syndrome   . Cellulitis of both lower extremities 04/11/2015  . CHF (congestive heart failure) ([place])   . Chronic kidney disease    CKI- followed by [facility]  . Colon polyp, hyperplastic 2007 & 2012  . Complication of anesthesia 1999   svt with renal calculi surgery, no problems since  . CTS (carpal tunnel syndrome)    bilateral  . Diabetes mellitus   . Eczema    . GERD (gastroesophageal reflux disease)   . History of kidney stones 1999  . Hyperlipidemia   . Hypertension   . Leg ulcer ([place]) 04/24/2015   Right lateral leg No evidence of an infection Monitor closely Keep edema controlled   . Leg ulcer ([place])    right lower  . Meralgia paresthetica    Dr. [name]  . Morbid obesity ([place])   . Morbid obesity ([place])   . Neuropathy    toes and legs  . Osteoarthrosis, unspecified whether generalized or localized, lower leg    knee  . PUD (peptic ulcer disease)   . Shortness of breath dyspnea    with exertion  . Sleep apnea  per progress note 02/25/2018  . Type II or unspecified type diabetes mellitus without mention of complication, not stated as uncontrolled   . Unspecified hereditary and idiopathic peripheral neuropathy   . Urticaria   . Vitamin B12 deficiency   . Wears glasses   . Wound cellulitis    right upper leg, healing well    Family History  Problem Relation Age of Onset  . Colon cancer Mother   . Prostate cancer Father   . Colon cancer Father   . Diabetes Maternal Aunt   . Breast cancer Maternal Aunt   . Diabetes Maternal Uncle   . Diabetes Paternal Aunt   . Stroke Paternal Aunt        > 65  . Heart disease Paternal Aunt   . Diabetes Paternal Uncle   . Breast cancer Maternal Aunt         X 2  . Breast cancer Cousin     Past Surgical History:  Procedure Laterality Date  . ABDOMINAL HYSTERECTOMY    . CARDIAC CATHETERIZATION  2002   non obstructive disease  . colonoscopy with polypectomy  2007 & 2012    hyperplastic ;Dr Watt Climes  . COLONOSCOPY WITH PROPOFOL N/A 06/04/2015   Procedure: COLONOSCOPY WITH PROPOFOL;  Surgeon: Jerene Bears, MD;  Location: [facility];  Service: Gastroenterology;  Laterality: N/A;  . DEBRIDEMENT LEG Right 03/02/2018   WOUND VAC APPLIED  . DEBRIDEMENT LEG Right 05/27/2018   RIGHT LOWER LEG DEBRIDEMENT,  SKIN GRAFT, VAC PLACEMENT   . DILATION AND CURETTAGE OF UTERUS     multiple  . HEMORRHOID  SURGERY    . I&D EXTREMITY Right 03/02/2018   Procedure: RIGHT LEG DEBRIDEMENT AND PLACE VAC;  Surgeon: [name], MD;  Location: [place];  Service: Orthopedics;  Laterality: Right;  . I&D EXTREMITY Right 03/04/2018   Procedure: REPEAT IRRIGATION AND DEBRIDEMENT RIGHT LEG, PLACE WOUND VAC;  Surgeon: [name], MD;  Location: [place];  Service: Orthopedics;  Laterality: Right;  . I&D EXTREMITY Right 03/30/2018   Procedure: IRRIGATION AND DEBRIDEMENT RIGHT LEG, APPLY WOUND VAC;  Surgeon: [name], MD;  Location: [place];  Service: Orthopedics;  Laterality: Right;  . I&D EXTREMITY Right 05/27/2018   Procedure: RIGHT LOWER LEG DEBRIDEMENT,  SKIN GRAFT, VAC PLACEMENT;  Surgeon: [name], MD;  Location: [place] [facility];  Service: Orthopedics;  Laterality: Right;  RIGHT LOWER LEG DEBRIDEMENT,  SKIN GRAFT, VAC PLACEMENT  . renal calculi  12/1997   SVT with induction of anesthesia  . right knee arthroscopy    . SKIN SPLIT GRAFT Right 03/04/2018   Procedure: POSSIBLE SPLIT THICKNESS SKIN GRAFT;  Surgeon: [name], MD;  Location: [place];  Service: Orthopedics;  Laterality: Right;  . SKIN SPLIT GRAFT Right 04/01/2018   Procedure: REPEAT IRRIGATION AND DEBRIDEMENT RIGHT LEG, APPLY SPLIT THICKNESS SKIN GRAFT;  Surgeon: [name], MD;  Location: [place];  Service: Orthopedics;  Laterality: Right;  . TONSILLECTOMY AND ADENOIDECTOMY     Social History   Occupational History  . Occupation: Disabled    Employer: RETIRED  Tobacco Use  . Smoking status: Never Smoker  . Smokeless tobacco: Never Used  Substance and Sexual Activity  . Alcohol use: No    Alcohol/week: 0.0 standard drinks  . Drug use: No  . Sexual activity: Not on file

## 2018-11-08 NOTE — Progress Notes (Deleted)
Subjective:    Patient ID: April [name], female    DOB: [birth year], 68 y.o.   MRN: [id number]  HPI The patient is here for an acute visit.   Right hip and leg pain:    Medications and allergies reviewed with patient and updated if appropriate.  Patient Active Problem List   Diagnosis Date Noted  . Achilles tendon sprain, left, initial encounter   . Osteomyelitis of ankle or foot, acute, left ([place]) 09/07/2018  . Osteomyelitis of ankle or foot, left, acute ([place]) 09/07/2018  . Acute on chronic kidney failure ([place]) 08/15/2018  . Onychomycosis 05/19/2018  . Idiopathic chronic venous hypertension of right lower extremity with ulcer and inflammation ([place]) 05/19/2018  . Traumatic wound dehiscence   . Skin lumps 01/14/2018  . Bilateral leg cramps 12/14/2017  . Left shoulder pain 08/14/2016  . Meralgia paresthetica 07/16/2016  . Chronic left-sided low back pain with left-sided sciatica 06/02/2016  . Whole body pain 03/20/2016  . Diabetic peripheral neuropathy ([place]) 03/20/2016  . Osteopenia 01/11/2016  . CKD (chronic kidney disease) stage 3, GFR 30-59 ml/min (HCC) 01/02/2016  . Hand paresthesia 07/18/2015  . Carpal tunnel syndrome 06/07/2015  . Family history of colon cancer   . Diabetes mellitus with neurological manifestations ([place]) 04/18/2015  . Bilateral leg edema 04/11/2015  . Abnormality of gait 01/03/2015  . Hereditary and idiopathic peripheral neuropathy 01/03/2015  . GERD (gastroesophageal reflux disease) 06/17/2014  . Hx of colonic polyps 12/15/2012  . Vitamin B12 deficiency 11/03/2012  . Intrinsic asthma 03/23/2012  . Chronic diastolic heart failure ([place]) 02/20/2011  . OSA (obstructive sleep apnea) 09/17/2010  . URINARY URGENCY 01/08/2010  . CAD, NATIVE VESSEL 11/20/2008  . Osteoarthritis of knees, bilateral 06/14/2008  . Hyperlipidemia 05/10/2007  . Essential hypertension 01/18/2007  . Morbid (severe) obesity due to excess calories ([place]) 04/30/2006  . HX,  PERSONAL, PEPTIC ULCER DISEASE 04/30/2006    Current Outpatient Medications on File Prior to Visit  Medication Sig Dispense Refill  . albuterol (PROVENTIL) (2.5 MG/3ML) 0.083% nebulizer solution Take 3 mLs (2.5 mg total) by nebulization every 6 (six) hours as needed for wheezing or shortness of breath. 75 mL 12  . allopurinol (ZYLOPRIM) 100 MG tablet Take 1 tablet (100 mg total) by mouth daily. 90 tablet 1  . aspirin (ECOTRIN LOW STRENGTH) 81 MG EC tablet Take 81 mg by mouth at bedtime.     . budesonide-formoterol (SYMBICORT) 160-4.5 MCG/ACT inhaler one - two inhalations every 12 hours; gargle and spit after use (Patient taking differently: Inhale 2 puffs into the lungs 2 (two) times daily as needed (shortness of breath). gargle and spit after use) 1 Inhaler 4  . calcium carbonate (TUMS - DOSED IN MG ELEMENTAL CALCIUM) 500 MG chewable tablet Chew 2 tablets by mouth daily as needed for indigestion or heartburn.    . carvedilol (COREG) 25 MG tablet TAKE 1 TABLET BY MOUTH TWICE DAILY WITH A MEAL (Patient taking differently: Take 25 mg by mouth daily. ) 60 tablet 5  . colchicine 0.6 MG tablet Take 1 tablet (0.6 mg total) by mouth daily. 90 tablet 1  . collagenase (SANTYL) ointment Apply topically daily. 15 g 0  . cyanocobalamin (,VITAMIN B-12,) 1000 MCG/ML injection INJECT 1ML INTO MUSCLE EVERY 30 DAYS (Patient taking differently: Inject 1,000 mcg into the muscle every 30 (thirty) days. ) 10 mL 0  . diclofenac sodium (VOLTAREN) 1 % GEL Apply 2 g topically 4 (four) times daily. (Patient taking differently: Apply 2  g topically 4 (four) times daily as needed (pain). ) 100 g 11  . EPINEPHrine 0.3 mg/0.3 mL IJ SOAJ injection Inject 0.3 mLs (0.3 mg total) into the muscle once as needed for anaphylaxis. 2 mL 1  . famotidine (PEPCID) 20 MG tablet Take 1 tablet (20 mg total) by mouth 2 (two) times daily. 244 tablet 3  . folic acid (FOLVITE) 010 MCG tablet Take 400 mcg by mouth at bedtime.     . furosemide  (LASIX) 80 MG tablet Take 1-1.5 tablets (80-120 mg total) by mouth 2 (two) times daily. 1.5 tablets in morning, 1.5 tablet in afternoon 270 tablet 1  . gabapentin (NEURONTIN) 100 MG capsule Take 1 capsule (100 mg total) by mouth 3 (three) times daily. 90 capsule 0  . hydrOXYzine (ATARAX/VISTARIL) 25 MG tablet Take 25 mg by mouth every 6 (six) hours as needed for itching.     . montelukast (SINGULAIR) 10 MG tablet Take 1 tablet (10 mg total) by mouth at bedtime. 30 tablet 5  . Omega-3 Fatty Acids (FISH OIL) 1000 MG CAPS Take 1,000 mg by mouth daily.     . Oxcarbazepine (TRILEPTAL) 300 MG tablet Take 1 tablet (300 mg total) by mouth 2 (two) times daily. 60 tablet 0  . oxyCODONE-acetaminophen (PERCOCET) 5-325 MG tablet Take 1 tablet by mouth every 8 (eight) hours as needed for moderate pain or severe pain. 15 tablet 0  . permethrin (ELIMITE) 5 % cream Apply 1 application topically as needed. Hives and rash on arms and chest    . potassium chloride SA (KLOR-CON) 20 MEQ tablet Take 20 mEq by mouth 3 (three) times daily.    . silver sulfADIAZINE (SILVADENE) 1 % cream Apply 1 application topically daily. 400 g 0  . simvastatin (ZOCOR) 40 MG tablet Take 1 tablet (40 mg total) by mouth at bedtime. 90 tablet 1  . spironolactone (ALDACTONE) 25 MG tablet TAKE 1 TABLET BY MOUTH ONCE DAILY 90 tablet 1   No current facility-administered medications on file prior to visit.     Past Medical History:  Diagnosis Date  . Anemia   . Asthma   . CAD (coronary artery disease)   . Carpal tunnel syndrome   . Cellulitis of both lower extremities 04/11/2015  . CHF (congestive heart failure) ([place])   . Chronic kidney disease    CKI- followed by [facility]  . Colon polyp, hyperplastic 2007 & 2012  . Complication of anesthesia 1999   svt with renal calculi surgery, no problems since  . CTS (carpal tunnel syndrome)    bilateral  . Diabetes mellitus   . Eczema   . GERD (gastroesophageal reflux disease)   .  History of kidney stones 1999  . Hyperlipidemia   . Hypertension   . Leg ulcer ([place]) 04/24/2015   Right lateral leg No evidence of an infection Monitor closely Keep edema controlled   . Leg ulcer ([place])    right lower  . Meralgia paresthetica    Dr. [name]  . Morbid obesity ([place])   . Morbid obesity ([place])   . Neuropathy    toes and legs  . Osteoarthrosis, unspecified whether generalized or localized, lower leg    knee  . PUD (peptic ulcer disease)   . Shortness of breath dyspnea    with exertion  . Sleep apnea    per progress note 02/25/2018  . Type II or unspecified type diabetes mellitus without mention of complication, not stated as uncontrolled   .  Unspecified hereditary and idiopathic peripheral neuropathy   . Urticaria   . Vitamin B12 deficiency   . Wears glasses   . Wound cellulitis    right upper leg, healing well    Past Surgical History:  Procedure Laterality Date  . ABDOMINAL HYSTERECTOMY    . CARDIAC CATHETERIZATION  2002   non obstructive disease  . colonoscopy with polypectomy  2007 & 2012    hyperplastic ;Dr Watt Climes  . COLONOSCOPY WITH PROPOFOL N/A 06/04/2015   Procedure: COLONOSCOPY WITH PROPOFOL;  Surgeon: Jerene Bears, MD;  [facility];  Service: Gastroenterology;  Laterality: N/A;  . DEBRIDEMENT LEG Right 03/02/2018   WOUND VAC APPLIED  . DEBRIDEMENT LEG Right 05/27/2018   RIGHT LOWER LEG DEBRIDEMENT,  SKIN GRAFT, VAC PLACEMENT   . DILATION AND CURETTAGE OF UTERUS     multiple  . HEMORRHOID SURGERY    . I&D EXTREMITY Right 03/02/2018   Procedure: RIGHT LEG DEBRIDEMENT AND PLACE VAC;  Surgeon: [name], MD;  Location: [place];  Service: Orthopedics;  Laterality: Right;  . I&D EXTREMITY Right 03/04/2018   Procedure: REPEAT IRRIGATION AND DEBRIDEMENT RIGHT LEG, PLACE WOUND VAC;  Surgeon: [name], MD;  Location: [facility];  Service: Orthopedics;  Laterality: Right;  . I&D EXTREMITY Right 03/30/2018   Procedure: IRRIGATION AND DEBRIDEMENT RIGHT LEG,  APPLY WOUND VAC;  Surgeon: [name], MD;  Location: [facility];  Service: Orthopedics;  Laterality: Right;  . I&D EXTREMITY Right 05/27/2018   Procedure: RIGHT LOWER LEG DEBRIDEMENT,  SKIN GRAFT, VAC PLACEMENT;  Surgeon: [name], MD;  Location: [facility];  Service: Orthopedics;  Laterality: Right;  RIGHT LOWER LEG DEBRIDEMENT,  SKIN GRAFT, VAC PLACEMENT  . renal calculi  12/1997   SVT with induction of anesthesia  . right knee arthroscopy    . SKIN SPLIT GRAFT Right 03/04/2018   Procedure: POSSIBLE SPLIT THICKNESS SKIN GRAFT;  Surgeon: [name], MD;  Location: [place];  Service: Orthopedics;  Laterality: Right;  . SKIN SPLIT GRAFT Right 04/01/2018   Procedure: REPEAT IRRIGATION AND DEBRIDEMENT RIGHT LEG, APPLY SPLIT THICKNESS SKIN GRAFT;  Surgeon: [name], MD;  Location: [facility];  Service: Orthopedics;  Laterality: Right;  . TONSILLECTOMY AND ADENOIDECTOMY      Social History   Socioeconomic History  . Marital status: Married    Spouse name: [name]  . Number of children: 1  . Years of education: BS  . Highest education level: Not on file  Occupational History  . Occupation: Disabled    Employer: RETIRED  Social Needs  . Financial resource strain: Not on file  . Food insecurity    Worry: Not on file    Inability: Not on file  . Transportation needs    Medical: Not on file    Non-medical: Not on file  Tobacco Use  . Smoking status: Never Smoker  . Smokeless tobacco: Never Used  Substance and Sexual Activity  . Alcohol use: No    Alcohol/week: 0.0 standard drinks  . Drug use: No  . Sexual activity: Not on file  Lifestyle  . Physical activity    Days per week: Not on file    Minutes per session: Not on file  . Stress: Not on file  Relationships  . Social Herbalist on phone: Not on file    Gets together: Not on file    Attends religious service: Not on file    Active member  of club or organization: Not on file    Attends meetings of clubs or  organizations: Not on file    Relationship status: Not on file  Other Topics Concern  . Not on file  Social History Narrative   Patient lives at home with her husband [name] Patient is retired and has a Conservation officer, nature.    Caffeine - some times.   Right handed.    Family History  Problem Relation Age of Onset  . Colon cancer Mother   . Prostate cancer Father   . Colon cancer Father   . Diabetes Maternal Aunt   . Breast cancer Maternal Aunt   . Diabetes Maternal Uncle   . Diabetes Paternal Aunt   . Stroke Paternal Aunt        > 65  . Heart disease Paternal Aunt   . Diabetes Paternal Uncle   . Breast cancer Maternal Aunt         X 2  . Breast cancer Cousin     Review of Systems     Objective:  There were no vitals filed for this visit. BP Readings from Last 3 Encounters:  10/28/18 (!) 150/80  09/20/18 124/72  09/15/18 (!) 112/55   Wt Readings from Last 3 Encounters:  11/03/18 (!) 348 lb (157.9 kg)  10/28/18 (!) 348 lb (157.9 kg)  09/20/18 (!) 348 lb 3.2 oz (157.9 kg)   There is no height or weight on file to calculate BMI.   Physical Exam         Assessment & Plan:    See Problem List for Assessment and Plan of chronic medical problems.

## 2018-11-09 ENCOUNTER — Inpatient Hospital Stay (HOSPITAL_COMMUNITY)
Admission: EM | Admit: 2018-11-09 | Discharge: 2018-11-16 | DRG: 299 | Disposition: A | Discharge status: Skilled Nursing Facility | Payer: Medicare Other | Attending: Internal Medicine | Admitting: Internal Medicine

## 2018-11-09 ENCOUNTER — Other Ambulatory Visit: Payer: Self-pay

## 2018-11-09 ENCOUNTER — Emergency Department (HOSPITAL_COMMUNITY): Payer: Medicare Other

## 2018-11-09 ENCOUNTER — Ambulatory Visit: Payer: Medicare Other | Admitting: Internal Medicine

## 2018-11-09 ENCOUNTER — Encounter (HOSPITAL_COMMUNITY): Payer: Self-pay

## 2018-11-09 ENCOUNTER — Observation Stay (HOSPITAL_COMMUNITY): Payer: Medicare Other

## 2018-11-09 DIAGNOSIS — R0602 Shortness of breath: Secondary | ICD-10-CM

## 2018-11-09 DIAGNOSIS — E662 Morbid (severe) obesity with alveolar hypoventilation: Secondary | ICD-10-CM | POA: Diagnosis present

## 2018-11-09 DIAGNOSIS — N390 Urinary tract infection, site not specified: Secondary | ICD-10-CM

## 2018-11-09 DIAGNOSIS — L97219 Non-pressure chronic ulcer of right calf with unspecified severity: Secondary | ICD-10-CM | POA: Diagnosis present

## 2018-11-09 DIAGNOSIS — I87331 Chronic venous hypertension (idiopathic) with ulcer and inflammation of right lower extremity: Secondary | ICD-10-CM

## 2018-11-09 DIAGNOSIS — R0902 Hypoxemia: Secondary | ICD-10-CM

## 2018-11-09 DIAGNOSIS — Z800 Family history of malignant neoplasm of digestive organs: Secondary | ICD-10-CM

## 2018-11-09 DIAGNOSIS — Z1611 Resistance to penicillins: Secondary | ICD-10-CM | POA: Diagnosis present

## 2018-11-09 DIAGNOSIS — E785 Hyperlipidemia, unspecified: Secondary | ICD-10-CM | POA: Diagnosis present

## 2018-11-09 DIAGNOSIS — Z79891 Long term (current) use of opiate analgesic: Secondary | ICD-10-CM

## 2018-11-09 DIAGNOSIS — Z8719 Personal history of other diseases of the digestive system: Secondary | ICD-10-CM

## 2018-11-09 DIAGNOSIS — E1140 Type 2 diabetes mellitus with diabetic neuropathy, unspecified: Secondary | ICD-10-CM | POA: Diagnosis present

## 2018-11-09 DIAGNOSIS — R600 Localized edema: Secondary | ICD-10-CM

## 2018-11-09 DIAGNOSIS — J9601 Acute respiratory failure with hypoxia: Secondary | ICD-10-CM | POA: Diagnosis present

## 2018-11-09 DIAGNOSIS — Z888 Allergy status to other drugs, medicaments and biological substances status: Secondary | ICD-10-CM

## 2018-11-09 DIAGNOSIS — M25551 Pain in right hip: Secondary | ICD-10-CM

## 2018-11-09 DIAGNOSIS — I87311 Chronic venous hypertension (idiopathic) with ulcer of right lower extremity: Secondary | ICD-10-CM | POA: Diagnosis present

## 2018-11-09 DIAGNOSIS — Z20828 Contact with and (suspected) exposure to other viral communicable diseases: Secondary | ICD-10-CM | POA: Diagnosis present

## 2018-11-09 DIAGNOSIS — Z8249 Family history of ischemic heart disease and other diseases of the circulatory system: Secondary | ICD-10-CM

## 2018-11-09 DIAGNOSIS — E876 Hypokalemia: Secondary | ICD-10-CM

## 2018-11-09 DIAGNOSIS — M25559 Pain in unspecified hip: Secondary | ICD-10-CM

## 2018-11-09 DIAGNOSIS — Z7982 Long term (current) use of aspirin: Secondary | ICD-10-CM

## 2018-11-09 DIAGNOSIS — Z803 Family history of malignant neoplasm of breast: Secondary | ICD-10-CM

## 2018-11-09 DIAGNOSIS — R52 Pain, unspecified: Secondary | ICD-10-CM

## 2018-11-09 DIAGNOSIS — Z882 Allergy status to sulfonamides status: Secondary | ICD-10-CM

## 2018-11-09 DIAGNOSIS — Z880 Allergy status to penicillin: Secondary | ICD-10-CM

## 2018-11-09 DIAGNOSIS — I5033 Acute on chronic diastolic (congestive) heart failure: Secondary | ICD-10-CM | POA: Diagnosis present

## 2018-11-09 DIAGNOSIS — Z6841 Body Mass Index (BMI) 40.0 and over, adult: Secondary | ICD-10-CM

## 2018-11-09 DIAGNOSIS — B961 Klebsiella pneumoniae [K. pneumoniae] as the cause of diseases classified elsewhere: Secondary | ICD-10-CM | POA: Diagnosis present

## 2018-11-09 DIAGNOSIS — Z8711 Personal history of peptic ulcer disease: Secondary | ICD-10-CM

## 2018-11-09 DIAGNOSIS — G8929 Other chronic pain: Secondary | ICD-10-CM | POA: Diagnosis present

## 2018-11-09 DIAGNOSIS — M1710 Unilateral primary osteoarthritis, unspecified knee: Secondary | ICD-10-CM | POA: Diagnosis present

## 2018-11-09 DIAGNOSIS — E1122 Type 2 diabetes mellitus with diabetic chronic kidney disease: Secondary | ICD-10-CM | POA: Diagnosis present

## 2018-11-09 DIAGNOSIS — S81801A Unspecified open wound, right lower leg, initial encounter: Secondary | ICD-10-CM

## 2018-11-09 DIAGNOSIS — Z833 Family history of diabetes mellitus: Secondary | ICD-10-CM

## 2018-11-09 DIAGNOSIS — Z8042 Family history of malignant neoplasm of prostate: Secondary | ICD-10-CM

## 2018-11-09 DIAGNOSIS — I87301 Chronic venous hypertension (idiopathic) without complications of right lower extremity: Secondary | ICD-10-CM | POA: Diagnosis present

## 2018-11-09 DIAGNOSIS — I130 Hypertensive heart and chronic kidney disease with heart failure and stage 1 through stage 4 chronic kidney disease, or unspecified chronic kidney disease: Secondary | ICD-10-CM | POA: Diagnosis present

## 2018-11-09 DIAGNOSIS — Z87442 Personal history of urinary calculi: Secondary | ICD-10-CM

## 2018-11-09 DIAGNOSIS — N179 Acute kidney failure, unspecified: Secondary | ICD-10-CM

## 2018-11-09 DIAGNOSIS — N183 Chronic kidney disease, stage 3 unspecified: Secondary | ICD-10-CM | POA: Diagnosis present

## 2018-11-09 DIAGNOSIS — I2510 Atherosclerotic heart disease of native coronary artery without angina pectoris: Secondary | ICD-10-CM | POA: Diagnosis present

## 2018-11-09 DIAGNOSIS — J4520 Mild intermittent asthma, uncomplicated: Secondary | ICD-10-CM | POA: Diagnosis present

## 2018-11-09 DIAGNOSIS — Z90710 Acquired absence of both cervix and uterus: Secondary | ICD-10-CM

## 2018-11-09 DIAGNOSIS — Z823 Family history of stroke: Secondary | ICD-10-CM

## 2018-11-09 DIAGNOSIS — D509 Iron deficiency anemia, unspecified: Secondary | ICD-10-CM | POA: Diagnosis present

## 2018-11-09 DIAGNOSIS — Z91013 Allergy to seafood: Secondary | ICD-10-CM

## 2018-11-09 DIAGNOSIS — E873 Alkalosis: Secondary | ICD-10-CM | POA: Diagnosis present

## 2018-11-09 LAB — CBC WITH DIFFERENTIAL/PLATELET
Abs Immature Granulocytes: 0.03 10*3/uL (ref 0.00–0.07)
Basophils Absolute: 0 10*3/uL (ref 0.0–0.1)
Basophils Relative: 0 %
Eosinophils Absolute: 0 10*3/uL (ref 0.0–0.5)
Eosinophils Relative: 0 %
HCT: 36.7 % (ref 36.0–46.0)
Hemoglobin: 11.1 g/dL — ABNORMAL LOW (ref 12.0–15.0)
Immature Granulocytes: 0 %
Lymphocytes Relative: 16 %
Lymphs Abs: 1.1 10*3/uL (ref 0.7–4.0)
MCH: 28.7 pg (ref 26.0–34.0)
MCHC: 30.2 g/dL (ref 30.0–36.0)
MCV: 94.8 fL (ref 80.0–100.0)
Monocytes Absolute: 0.7 10*3/uL (ref 0.1–1.0)
Monocytes Relative: 10 %
Neutro Abs: 4.9 10*3/uL (ref 1.7–7.7)
Neutrophils Relative %: 74 %
Platelets: 208 10*3/uL (ref 150–400)
RBC: 3.87 MIL/uL (ref 3.87–5.11)
RDW: 15 % (ref 11.5–15.5)
WBC: 6.7 10*3/uL (ref 4.0–10.5)
nRBC: 0 % (ref 0.0–0.2)

## 2018-11-09 LAB — URINALYSIS, ROUTINE W REFLEX MICROSCOPIC
Bilirubin Urine: NEGATIVE
Glucose, UA: NEGATIVE mg/dL
Ketones, ur: NEGATIVE mg/dL
Nitrite: NEGATIVE
Protein, ur: 30 mg/dL — AB
Specific Gravity, Urine: 1.012 (ref 1.005–1.030)
WBC, UA: >50 WBC/hpf — ABNORMAL HIGH (ref 0–5)
pH: 5 (ref 5.0–8.0)

## 2018-11-09 LAB — BLOOD GAS, ARTERIAL
Acid-Base Excess: 4.2 mmol/L — ABNORMAL HIGH (ref 0.0–2.0)
Bicarbonate: 29.7 mmol/L — ABNORMAL HIGH (ref 20.0–28.0)
O2 Saturation: 97.8 %
Patient temperature: 98.6
pCO2 arterial: 51.5 mmHg — ABNORMAL HIGH (ref 32.0–48.0)
pH, Arterial: 7.379 (ref 7.350–7.450)
pO2, Arterial: 108 mmHg (ref 83.0–108.0)

## 2018-11-09 LAB — BASIC METABOLIC PANEL
Anion gap: 12 (ref 5–15)
BUN: 34 mg/dL — ABNORMAL HIGH (ref 8–23)
CO2: 29 mmol/L (ref 22–32)
Calcium: 9.1 mg/dL (ref 8.9–10.3)
Chloride: 97 mmol/L — ABNORMAL LOW (ref 98–111)
Creatinine, Ser: 1.96 mg/dL — ABNORMAL HIGH (ref 0.44–1.00)
GFR calc Af Amer: 30 mL/min — ABNORMAL LOW (ref >60)
GFR calc non Af Amer: 26 mL/min — ABNORMAL LOW (ref >60)
Glucose, Bld: 134 mg/dL — ABNORMAL HIGH (ref 70–99)
Potassium: 3.4 mmol/L — ABNORMAL LOW (ref 3.5–5.1)
Sodium: 138 mmol/L (ref 135–145)

## 2018-11-09 LAB — MAGNESIUM: Magnesium: 2.3 mg/dL (ref 1.7–2.4)

## 2018-11-09 LAB — GLUCOSE, CAPILLARY: Glucose-Capillary: 106 mg/dL — ABNORMAL HIGH (ref 70–99)

## 2018-11-09 LAB — BRAIN NATRIURETIC PEPTIDE: B Natriuretic Peptide: 84.9 pg/mL (ref 0.0–100.0)

## 2018-11-09 LAB — CBG MONITORING, ED: Glucose-Capillary: 127 mg/dL — ABNORMAL HIGH (ref 70–99)

## 2018-11-09 MED ORDER — IPRATROPIUM-ALBUTEROL 0.5-2.5 (3) MG/3ML IN SOLN
3.0000 mL | Freq: Two times a day (BID) | RESPIRATORY_TRACT | Status: DC
  Administered 2018-11-10 – 2018-11-15 (×12): 3 mL via RESPIRATORY_TRACT
  Filled 2018-11-09 (×12): qty 3

## 2018-11-09 MED ORDER — ACETAMINOPHEN 325 MG PO TABS
650.0000 mg | ORAL_TABLET | Freq: Four times a day (QID) | ORAL | Status: DC | PRN
  Administered 2018-11-09 – 2018-11-16 (×14): 650 mg via ORAL
  Filled 2018-11-09 (×14): qty 2

## 2018-11-09 MED ORDER — HYDRALAZINE HCL 20 MG/ML IJ SOLN: 5.0000 mg | INTRAMUSCULAR | Status: DC | PRN

## 2018-11-09 MED ORDER — IPRATROPIUM-ALBUTEROL 0.5-2.5 (3) MG/3ML IN SOLN
3.0000 mL | Freq: Four times a day (QID) | RESPIRATORY_TRACT | Status: DC
  Administered 2018-11-09: 22:00:00 3 mL via RESPIRATORY_TRACT
  Filled 2018-11-09: qty 3

## 2018-11-09 MED ORDER — TRAMADOL HCL 50 MG PO TABS
50.0000 mg | ORAL_TABLET | Freq: Four times a day (QID) | ORAL | Status: DC | PRN
  Administered 2018-11-09 – 2018-11-16 (×18): 50 mg via ORAL
  Filled 2018-11-09 (×18): qty 1

## 2018-11-09 MED ORDER — SODIUM CHLORIDE 0.9 % IV SOLN
1.0000 g | Freq: Once | INTRAVENOUS | Status: AC
  Administered 2018-11-09: 19:00:00 1 g via INTRAVENOUS
  Filled 2018-11-09: qty 10

## 2018-11-09 MED ORDER — IPRATROPIUM-ALBUTEROL 20-100 MCG/ACT IN AERS: 1.0000 | INHALATION_SPRAY | Freq: Four times a day (QID) | RESPIRATORY_TRACT | Status: DC

## 2018-11-09 MED ORDER — SODIUM CHLORIDE 0.9 % IV SOLN
1.0000 g | INTRAVENOUS | Status: DC
  Administered 2018-11-10 – 2018-11-13 (×4): 1 g via INTRAVENOUS
  Filled 2018-11-09 (×3): qty 10
  Filled 2018-11-09 (×3): qty 1

## 2018-11-09 MED ORDER — CHLORHEXIDINE GLUCONATE CLOTH 2 % EX PADS
6.0000 | MEDICATED_PAD | Freq: Every day | CUTANEOUS | Status: DC
  Administered 2018-11-10 – 2018-11-13 (×3): 6 via TOPICAL

## 2018-11-09 MED ORDER — INSULIN ASPART 100 UNIT/ML ~~LOC~~ SOLN: 0.0000 [IU] | Freq: Every day | SUBCUTANEOUS | Status: DC

## 2018-11-09 MED ORDER — ALBUTEROL SULFATE HFA 108 (90 BASE) MCG/ACT IN AERS: 2.0000 | INHALATION_SPRAY | RESPIRATORY_TRACT | Status: DC | PRN

## 2018-11-09 MED ORDER — POTASSIUM CHLORIDE CRYS ER 20 MEQ PO TBCR
40.0000 meq | EXTENDED_RELEASE_TABLET | Freq: Once | ORAL | Status: AC
  Administered 2018-11-09: 22:00:00 40 meq via ORAL
  Filled 2018-11-09: qty 2

## 2018-11-09 MED ORDER — ENOXAPARIN SODIUM 40 MG/0.4ML ~~LOC~~ SOLN
40.0000 mg | Freq: Every day | SUBCUTANEOUS | Status: DC
  Administered 2018-11-09 – 2018-11-10 (×2): 40 mg via SUBCUTANEOUS
  Filled 2018-11-09 (×2): qty 0.4

## 2018-11-09 MED ORDER — ALBUTEROL SULFATE (2.5 MG/3ML) 0.083% IN NEBU: 2.5000 mg | INHALATION_SOLUTION | RESPIRATORY_TRACT | Status: DC | PRN

## 2018-11-09 MED ORDER — INSULIN ASPART 100 UNIT/ML ~~LOC~~ SOLN
0.0000 [IU] | Freq: Three times a day (TID) | SUBCUTANEOUS | Status: DC
  Administered 2018-11-14 – 2018-11-15 (×3): 1 [IU] via SUBCUTANEOUS

## 2018-11-09 MED ORDER — SODIUM CHLORIDE 0.9 % IV SOLN
INTRAVENOUS | Status: DC
  Administered 2018-11-09: 22:00:00 via INTRAVENOUS

## 2018-11-09 MED ORDER — ACETAMINOPHEN 650 MG RE SUPP: 650.0000 mg | Freq: Four times a day (QID) | RECTAL | Status: DC | PRN

## 2018-11-09 NOTE — ED Notes (Signed)
Patient unable to ambulate in the room and stand at bedside.

## 2018-11-09 NOTE — Care Management (Signed)
[facility] ED CM contacted concerning assistance with transitional care, patient was evaluated in the [facility] ED no significant finding. Recommendations for [facility] referral.  CM will discuss plan with  ED CSW who spoke with family CM will continue to follow.

## 2018-11-09 NOTE — ED Provider Notes (Signed)
[facility] DEPT Provider Note   CSN: [id number] Arrival date & time: 11/09/18  1006     History   Chief Complaint Chief Complaint  Patient presents with  . Leg Pain  . Leg Injury  . Wound Check    HPI April [name] is a 68 y.o. female.     HPI   69yF with chronic LE edema, LLE wound, pain. Sent in for evaluation today for increasing symptoms over past several days. No acute trauma/strain. Has been getting wound care at home with dressing changed. No fever. No drainage. Of note, this history was obtained an subsequent evaluations and review of records because pt was too somnolent to obtain history from her on my initial evaluation.   Past Medical History:  Diagnosis Date  . Anemia   . Asthma   . CAD (coronary artery disease)   . Carpal tunnel syndrome   . Cellulitis of both lower extremities 04/11/2015  . CHF (congestive heart failure) ([place])   . Chronic kidney disease    [facility]- followed by [facility]  . Colon polyp, hyperplastic 2007 & 2012  . Complication of anesthesia 1999   svt with renal calculi surgery, no problems since  . CTS (carpal tunnel syndrome)    bilateral  . Diabetes mellitus   . Eczema   . GERD (gastroesophageal reflux disease)   . History of kidney stones 1999  . Hyperlipidemia   . Hypertension   . Leg ulcer ([place]) 04/24/2015   Right lateral leg No evidence of an infection Monitor closely Keep edema controlled   . Leg ulcer ([place])    right lower  . Meralgia paresthetica    Dr. [name]  . Morbid obesity ([place])   . Morbid obesity ([place])   . Neuropathy    toes and legs  . Osteoarthrosis, unspecified whether generalized or localized, lower leg    knee  . PUD (peptic ulcer disease)   . Shortness of breath dyspnea    with exertion  . Sleep apnea    per progress note 02/25/2018  . Type II or unspecified type diabetes mellitus without mention of complication, not stated as uncontrolled   . Unspecified hereditary and  idiopathic peripheral neuropathy   . Urticaria   . Vitamin B12 deficiency   . Wears glasses   . Wound cellulitis    right upper leg, healing well    Patient Active Problem List   Diagnosis Date Noted  . Achilles tendon sprain, left, initial encounter   . Osteomyelitis of ankle or foot, acute, left ([place]) 09/07/2018  . Osteomyelitis of ankle or foot, left, acute ([place]) 09/07/2018  . Acute on chronic kidney failure ([place]) 08/15/2018  . Onychomycosis 05/19/2018  . Idiopathic chronic venous hypertension of right lower extremity with ulcer and inflammation ([place]) 05/19/2018  . Traumatic wound dehiscence   . Skin lumps 01/14/2018  . Bilateral leg cramps 12/14/2017  . Left shoulder pain 08/14/2016  . Meralgia paresthetica 07/16/2016  . Chronic left-sided low back pain with left-sided sciatica 06/02/2016  . Whole body pain 03/20/2016  . Diabetic peripheral neuropathy ([place]) 03/20/2016  . Osteopenia 01/11/2016  . CKD (chronic kidney disease) stage 3, GFR 30-59 ml/min ([facility]) 01/02/2016  . Hand paresthesia 07/18/2015  . Carpal tunnel syndrome 06/07/2015  . Family history of colon cancer   . Diabetes mellitus with neurological manifestations ([place]) 04/18/2015  . Bilateral leg edema 04/11/2015  . Abnormality of gait 01/03/2015  . Hereditary and idiopathic peripheral neuropathy 01/03/2015  .  GERD (gastroesophageal reflux disease) 06/17/2014  . Hx of colonic polyps 12/15/2012  . Vitamin B12 deficiency 11/03/2012  . Intrinsic asthma 03/23/2012  . Chronic diastolic heart failure ([place]) 02/20/2011  . OSA (obstructive sleep apnea) 09/17/2010  . URINARY URGENCY 01/08/2010  . CAD, NATIVE VESSEL 11/20/2008  . Osteoarthritis of knees, bilateral 06/14/2008  . Hyperlipidemia 05/10/2007  . Essential hypertension 01/18/2007  . Morbid (severe) obesity due to excess calories ([place]) 04/30/2006  . HX, PERSONAL, PEPTIC ULCER DISEASE 04/30/2006    Past Surgical History:  Procedure Laterality Date  .  ABDOMINAL HYSTERECTOMY    . CARDIAC CATHETERIZATION  2002   non obstructive disease  . colonoscopy with polypectomy  2007 & 2012    hyperplastic ;Dr Watt Climes  . COLONOSCOPY WITH PROPOFOL N/A 06/04/2015   Procedure: COLONOSCOPY WITH PROPOFOL;  Surgeon: Jerene Bears, MD;  Location: [facility];  Service: Gastroenterology;  Laterality: N/A;  . DEBRIDEMENT LEG Right 03/02/2018   WOUND VAC APPLIED  . DEBRIDEMENT LEG Right 05/27/2018   RIGHT LOWER LEG DEBRIDEMENT,  SKIN GRAFT, VAC PLACEMENT   . DILATION AND CURETTAGE OF UTERUS     multiple  . HEMORRHOID SURGERY    . I&D EXTREMITY Right 03/02/2018   Procedure: RIGHT LEG DEBRIDEMENT AND PLACE VAC;  Surgeon: [name], MD;  Location: [facility];  Service: Orthopedics;  Laterality: Right;  . I&D EXTREMITY Right 03/04/2018   Procedure: REPEAT IRRIGATION AND DEBRIDEMENT RIGHT LEG, PLACE WOUND VAC;  Surgeon: [name], MD;  Location: [facility];  Service: Orthopedics;  Laterality: Right;  . I&D EXTREMITY Right 03/30/2018   Procedure: IRRIGATION AND DEBRIDEMENT RIGHT LEG, APPLY WOUND VAC;  Surgeon: [name], MD;  Location: [facility];  Service: Orthopedics;  Laterality: Right;  . I&D EXTREMITY Right 05/27/2018   Procedure: RIGHT LOWER LEG DEBRIDEMENT,  SKIN GRAFT, VAC PLACEMENT;  Surgeon: [name], MD;  Location: [facility];  Service: Orthopedics;  Laterality: Right;  RIGHT LOWER LEG DEBRIDEMENT,  SKIN GRAFT, VAC PLACEMENT  . renal calculi  12/1997   SVT with induction of anesthesia  . right knee arthroscopy    . SKIN SPLIT GRAFT Right 03/04/2018   Procedure: POSSIBLE SPLIT THICKNESS SKIN GRAFT;  Surgeon: [name], MD;  Location: [facility];  Service: Orthopedics;  Laterality: Right;  . SKIN SPLIT GRAFT Right 04/01/2018   Procedure: REPEAT IRRIGATION AND DEBRIDEMENT RIGHT LEG, APPLY SPLIT THICKNESS SKIN GRAFT;  Surgeon: [name], MD;  Location: [facility];  Service: Orthopedics;  Laterality: Right;  . TONSILLECTOMY AND ADENOIDECTOMY       OB History    No obstetric history on file.      Home Medications    Prior to Admission medications   Medication Sig Start Date End Date Taking? Authorizing Provider  albuterol (PROVENTIL) (2.5 MG/3ML) 0.083% nebulizer solution Take 3 mLs (2.5 mg total) by nebulization every 6 (six) hours as needed for wheezing or shortness of breath. 03/23/17   [name], MD  allopurinol (ZYLOPRIM) 100 MG tablet Take 1 tablet (100 mg total) by mouth daily. 08/29/18   [name], MD  aspirin (ECOTRIN LOW STRENGTH) 81 MG EC tablet Take 81 mg by mouth at bedtime.     [provider]  budesonide-formoterol (SYMBICORT) 160-4.5 MCG/ACT inhaler one - two inhalations every 12 hours; gargle and spit after use Patient taking differently: Inhale 2 puffs into the lungs 2 (two) times daily as needed (shortness of breath). gargle and spit after use  02/19/15   [name], MD  calcium carbonate (TUMS - DOSED IN MG ELEMENTAL CALCIUM) 500 MG chewable tablet Chew 2 tablets by mouth daily as needed for indigestion or heartburn.    [provider]  carvedilol (COREG) 25 MG tablet TAKE 1 TABLET BY MOUTH TWICE DAILY WITH A MEAL Patient taking differently: Take 25 mg by mouth daily.  07/18/18   [name], MD  colchicine 0.6 MG tablet Take 1 tablet (0.6 mg total) by mouth daily. 10/28/18   [name], MD  collagenase (SANTYL) ointment Apply topically daily. 09/16/18   Nita Sells, MD  cyanocobalamin (,VITAMIN B-12,) 1000 MCG/ML injection INJECT 1ML INTO MUSCLE EVERY 30 DAYS Patient taking differently: Inject 1,000 mcg into the muscle every 30 (thirty) days.  05/06/18   [name], MD  diclofenac sodium (VOLTAREN) 1 % GEL Apply 2 g topically 4 (four) times daily. Patient taking differently: Apply 2 g topically 4 (four) times daily as needed (pain).  03/20/16   Marcial Pacas, MD  EPINEPHrine 0.3 mg/0.3 mL IJ SOAJ injection Inject 0.3 mLs (0.3 mg total) into the muscle once as needed for anaphylaxis. 08/12/18    Kennith Gain, MD  famotidine (PEPCID) 20 MG tablet Take 1 tablet (20 mg total) by mouth 2 (two) times daily. 12/14/17   [name], MD  folic acid (FOLVITE) 361 MCG tablet Take 400 mcg by mouth at bedtime.     [provider]  furosemide (LASIX) 80 MG tablet Take 1-1.5 tablets (80-120 mg total) by mouth 2 (two) times daily. 1.5 tablets in morning, 1.5 tablet in afternoon 11/07/18   [name], MD  gabapentin (NEURONTIN) 100 MG capsule Take 1 capsule (100 mg total) by mouth 3 (three) times daily. 11/07/18 12/07/18  [name], MD  hydrOXYzine (ATARAX/VISTARIL) 25 MG tablet Take 25 mg by mouth every 6 (six) hours as needed for itching.     [provider]  montelukast (SINGULAIR) 10 MG tablet Take 1 tablet (10 mg total) by mouth at bedtime. 08/12/18   Kennith Gain, MD  Omega-3 Fatty Acids (FISH OIL) 1000 MG CAPS Take 1,000 mg by mouth daily.     [provider]  Oxcarbazepine (TRILEPTAL) 300 MG tablet Take 1 tablet (300 mg total) by mouth 2 (two) times daily. 11/07/18   [name], MD  oxyCODONE-acetaminophen (PERCOCET) 5-325 MG tablet Take 1 tablet by mouth every 8 (eight) hours as needed for moderate pain or severe pain. 11/03/18 11/03/19  [name], MD  permethrin (ELIMITE) 5 % cream Apply 1 application topically as needed. Hives and rash on arms and chest 08/13/18   [provider]  potassium chloride SA (KLOR-CON) 20 MEQ tablet Take 20 mEq by mouth 3 (three) times daily.    [provider]  silver sulfADIAZINE (SILVADENE) 1 % cream Apply 1 application topically daily. 06/08/18   [name], MD  simvastatin (ZOCOR) 40 MG tablet Take 1 tablet (40 mg total) by mouth at bedtime. 04/25/18   [name], MD  spironolactone (ALDACTONE) 25 MG tablet TAKE 1 TABLET BY MOUTH ONCE DAILY 10/24/18   [name], MD    Family History Family History  Problem Relation Age of Onset  . Colon cancer Mother   . Prostate  cancer Father   . Colon cancer Father   . Diabetes Maternal Aunt   . Breast cancer Maternal Aunt   . Diabetes Maternal Uncle   .  Diabetes Paternal Aunt   . Stroke Paternal Aunt        > 65  . Heart disease Paternal Aunt   . Diabetes Paternal Uncle   . Breast cancer Maternal Aunt         X 2  . Breast cancer Cousin     Social History Social History   Tobacco Use  . Smoking status: Never Smoker  . Smokeless tobacco: Never Used  Substance Use Topics  . Alcohol use: No    Alcohol/week: 0.0 standard drinks  . Drug use: No     Allergies   Penicillins, Shellfish allergy, Sulfonamide derivatives, Hydrochlorothiazide w-triamterene, Lotensin [benazepril hcl], Dipyridamole, Estrogens, Hydrochlorothiazide, Metronidazole, Other, Spironolactone, Sulfa antibiotics, Torsemide, Valsartan, and Mustard [allyl isothiocyanate]   Review of Systems Review of Systems  All systems reviewed and negative, other than as noted in HPI.  Physical Exam Updated Vital Signs BP (!) 153/64   Pulse 79   Temp (!) 97.5 F (36.4 C) (Oral)   Resp 15   SpO2 92%   Physical Exam Vitals signs and nursing note reviewed.  Constitutional:      General: She is not in acute distress.    Appearance: She is well-developed. She is obese.  HENT:     Head: Normocephalic and atraumatic.  Eyes:     General:        Right eye: No discharge.        Left eye: No discharge.     Conjunctiva/sclera: Conjunctivae normal.  Neck:     Musculoskeletal: Neck supple.  Cardiovascular:     Rate and Rhythm: Normal rate and regular rhythm.     Heart sounds: Normal heart sounds. No murmur. No friction rub. No gallop.   Pulmonary:     Effort: Pulmonary effort is normal. No respiratory distress.     Breath sounds: Normal breath sounds.  Abdominal:     General: There is no distension.     Palpations: Abdomen is soft.     Tenderness: There is no abdominal tenderness.  Musculoskeletal:        General: No tenderness.      Right lower leg: Edema present.     Left lower leg: Edema present.     Comments: Chronic appearing wound R shin. Superficial appearing ulcerations. Minimal serous drainage. No abscess. No cellulitis.   Skin:    General: Skin is warm and dry.  Neurological:     Mental Status: She is alert.  Psychiatric:        Behavior: Behavior normal.        Thought Content: Thought content normal.      ED Treatments / Results  Labs (all labs ordered are listed, but only abnormal results are displayed) Labs Reviewed  CBC WITH DIFFERENTIAL/PLATELET - Abnormal; Notable for the following components:      Result Value   Hemoglobin 11.1 (*)    All other components within normal limits  BASIC METABOLIC PANEL - Abnormal; Notable for the following components:   Potassium 3.4 (*)    Chloride 97 (*)    Glucose, Bld 134 (*)    BUN 34 (*)    Creatinine, Ser 1.96 (*)    GFR calc non Af Amer 26 (*)    GFR calc Af Amer 30 (*)    All other components within normal limits  BLOOD GAS, ARTERIAL - Abnormal; Notable for the following components:   pCO2 arterial 51.5 (*)    Bicarbonate 29.7 (*)  Acid-Base Excess 4.2 (*)    All other components within normal limits  URINALYSIS, ROUTINE W REFLEX MICROSCOPIC - Abnormal; Notable for the following components:   APPearance TURBID (*)    Hgb urine dipstick SMALL (*)    Protein, ur 30 (*)    Leukocytes,Ua LARGE (*)    WBC, UA >50 (*)    Bacteria, UA MANY (*)    All other components within normal limits  BASIC METABOLIC PANEL - Abnormal; Notable for the following components:   BUN 27 (*)    Creatinine, Ser 1.42 (*)    Calcium 8.3 (*)    GFR calc non Af Amer 38 (*)    GFR calc Af Amer 44 (*)    All other components within normal limits  GLUCOSE, CAPILLARY - Abnormal; Notable for the following components:   Glucose-Capillary 106 (*)    All other components within normal limits  GLUCOSE, CAPILLARY - Abnormal; Notable for the following components:    Glucose-Capillary 104 (*)    All other components within normal limits  CBC WITH DIFFERENTIAL/PLATELET - Abnormal; Notable for the following components:   RBC 3.41 (*)    Hemoglobin 9.8 (*)    HCT 32.6 (*)    All other components within normal limits  HEPATIC FUNCTION PANEL - Abnormal; Notable for the following components:   Total Protein 5.5 (*)    Albumin 2.8 (*)    All other components within normal limits  GLUCOSE, CAPILLARY - Abnormal; Notable for the following components:   Glucose-Capillary 116 (*)    All other components within normal limits  CBG MONITORING, ED - Abnormal; Notable for the following components:   Glucose-Capillary 127 (*)    All other components within normal limits  SARS CORONAVIRUS 2 (TAT 6-24 HRS)  URINE CULTURE  BRAIN NATRIURETIC PEPTIDE  MAGNESIUM  MAGNESIUM  PHOSPHORUS    EKG None  Radiology No results found.  Procedures Procedures (including critical care time)  Medications Ordered in ED Medications - No data to display   Initial Impression / Assessment and Plan / ED Course  I have reviewed the triage vital signs and the nursing notes.  Pertinent labs & imaging results that were available during my care of the patient were reviewed by me and considered in my medical decision making (see chart for details).       68yF presenting with LE pain/wound check. When I went to evaluate her she was extremely hard to arouse. o2 sats in 60s on RA. Probably sleep apnea based on the way she was breathing and her body habitus. With stimulation her oxygen saturation came up into 80s but still remain very somnolent. She was placed on 2L [place] and sats into 90s. This was not her presenting complaint but at this point I cannot even get a history from her. Will obtain testing including ABG.   I am not overly concerned about her leg. Superficial wound to R shin on top of chronic appearing changes. Doesn't appear to be infected.   Pt now more awake. Her ABG was  not as bad as I thought it might be. Probably sleep apnea. Transient hypoxemia resolved and has since had normal o2 sats on RA.   From a medical standpoint, she is stable for discharge. Husband contacted at the time of DC and says she can't go home. He says she can't walk. It sounds like this is not an acute issue though. Neurology note from 09/10/18:   "She has become more  sedentary cause of her weight, knee pain, sitting in wheelchair most of the time. She received home physical therapy,now using hospital bed and gel overlay, lower air loss mattress, new walker, overhead trapezius bar, because of her limited mobility,obesity, orthostatic dizziness, multiple joints pain, especially right knee pain, bilateral feet paresthesia, needle prick pain."  I'm not sure how much additional assistance can be provided at home over what she is already getting. Will contact care management. I suspect placement may be needed.   Final Clinical Impressions(s) / ED Diagnoses   Final diagnoses:  Open wound of right lower extremity, initial encounter  Leg edema, right    ED Discharge Orders    None       Virgel Manifold, MD 11/10/18 1206

## 2018-11-09 NOTE — H&P (Signed)
History and Physical    Dearia Wilmouth FVC:[id number] DOB: 1951-01-09 DOA: 11/09/2018  PCP: [name], MD Patient coming from: Home  Chief Complaint: Right lower extremity pain  HPI: April [name] is a 68 y.o. female with medical history significant of idiopathic chronic venous hypertension of the right lower extremity with ulceration status post debridement and skin grafting in April 2020, chronic lymphedema, anemia, asthma, CAD, CHF, CKD, diet-controlled type 2 diabetes, GERD, hypertension, hyperlipidemia, morbid obesity, neuropathy, PUD, sleep apnea presenting to the hospital for evaluation of right lower extremity pain secondary to a chronic wound.  Patient states she has had this wound on her right lower extremity since January and has had multiple surgeries done.  States this is causing her pain and her PCP has referred her to a pain clinic.  It has been difficult for her to transfer even from a chair due to this pain.  She is also having pain in her right hip for about a week now.  She did not fall.  Denies any fevers.  She has been having dysuria for the past few days.  No other complaints.  ED Course: Afebrile and hemodynamically stable.  No leukocytosis.  Potassium 3.4.  BUN 34, creatinine 1.9.  Baseline creatinine 1.4-1.5.  BNP normal.  UA with large amount of leukocytes, greater than 50 WBCs, and many bacteria on microscopic examination.  SARS-CoV-2 test pending.  Chest x-ray showing mild prominence of the bilateral perihilar interstitial markings, likely attenuated by low lung volumes.  No focal consolidation.  Patient received ceftriaxone.  Review of Systems:  All systems reviewed and apart from history of presenting illness, are negative.  Past Medical History:  Diagnosis Date  . Anemia   . Asthma   . CAD (coronary artery disease)   . Carpal tunnel syndrome   . Cellulitis of both lower extremities 04/11/2015  . CHF (congestive heart failure) ([place])   . Chronic kidney  disease    [facility]- followed by [facility]  . Colon polyp, hyperplastic 2007 & 2012  . Complication of anesthesia 1999   svt with renal calculi surgery, no problems since  . CTS (carpal tunnel syndrome)    bilateral  . Diabetes mellitus   . Eczema   . GERD (gastroesophageal reflux disease)   . History of kidney stones 1999  . Hyperlipidemia   . Hypertension   . Leg ulcer ([place]) 04/24/2015   Right lateral leg No evidence of an infection Monitor closely Keep edema controlled   . Leg ulcer ([employer])    right lower  . Meralgia paresthetica    Dr. [name]  . Morbid obesity ([place])   . Morbid obesity ([place])   . Neuropathy    toes and legs  . Osteoarthrosis, unspecified whether generalized or localized, lower leg    knee  . PUD (peptic ulcer disease)   . Shortness of breath dyspnea    with exertion  . Sleep apnea    per progress note 02/25/2018  . Type II or unspecified type diabetes mellitus without mention of complication, not stated as uncontrolled   . Unspecified hereditary and idiopathic peripheral neuropathy   . Urticaria   . Vitamin B12 deficiency   . Wears glasses   . Wound cellulitis    right upper leg, healing well    Past Surgical History:  Procedure Laterality Date  . ABDOMINAL HYSTERECTOMY    . CARDIAC CATHETERIZATION  2002   non obstructive disease  . colonoscopy with polypectomy  2007 & 2012    hyperplastic ;Dr Watt Climes  . COLONOSCOPY WITH PROPOFOL N/A 06/04/2015   Procedure: COLONOSCOPY WITH PROPOFOL;  Surgeon: Jerene Bears, MD;  Location: [facility];  Service: Gastroenterology;  Laterality: N/A;  . DEBRIDEMENT LEG Right 03/02/2018   WOUND VAC APPLIED  . DEBRIDEMENT LEG Right 05/27/2018   RIGHT LOWER LEG DEBRIDEMENT,  SKIN GRAFT, VAC PLACEMENT   . DILATION AND CURETTAGE OF UTERUS     multiple  . HEMORRHOID SURGERY    . I&D EXTREMITY Right 03/02/2018   Procedure: RIGHT LEG DEBRIDEMENT AND PLACE VAC;  Surgeon: [name], MD;  Location: [place];  Service: Orthopedics;   Laterality: Right;  . I&D EXTREMITY Right 03/04/2018   Procedure: REPEAT IRRIGATION AND DEBRIDEMENT RIGHT LEG, PLACE WOUND VAC;  Surgeon: [name], MD;  Location: [place];  Service: Orthopedics;  Laterality: Right;  . I&D EXTREMITY Right 03/30/2018   Procedure: IRRIGATION AND DEBRIDEMENT RIGHT LEG, APPLY WOUND VAC;  Surgeon: [name], MD;  Location: [place];  Service: Orthopedics;  Laterality: Right;  . I&D EXTREMITY Right 05/27/2018   Procedure: RIGHT LOWER LEG DEBRIDEMENT,  SKIN GRAFT, VAC PLACEMENT;  Surgeon: [name], MD;  Location: [place];  Service: Orthopedics;  Laterality: Right;  RIGHT LOWER LEG DEBRIDEMENT,  SKIN GRAFT, VAC PLACEMENT  . renal calculi  12/1997   SVT with induction of anesthesia  . right knee arthroscopy    . SKIN SPLIT GRAFT Right 03/04/2018   Procedure: POSSIBLE SPLIT THICKNESS SKIN GRAFT;  Surgeon: [name], MD;  Location: [place];  Service: Orthopedics;  Laterality: Right;  . SKIN SPLIT GRAFT Right 04/01/2018   Procedure: REPEAT IRRIGATION AND DEBRIDEMENT RIGHT LEG, APPLY SPLIT THICKNESS SKIN GRAFT;  Surgeon: [name], MD;  Location: [place];  Service: Orthopedics;  Laterality: Right;  . TONSILLECTOMY AND ADENOIDECTOMY       reports that she has never smoked. She has never used smokeless tobacco. She reports that she does not drink alcohol or use drugs.  Allergies  Allergen Reactions  . Penicillins Rash and Other (See Comments)    She was told not to take it anymore. Has patient had a PCN reaction causing immediate rash, facial/tongue/throat swelling, SOB or lightheadedness with hypotension: Yes Has patient had a PCN reaction causing severe rash involving mucus membranes or skin necrosis: No Has patient had a PCN reaction that required hospitalization: No Has patient had a PCN reaction occurring within the last 10 years: No If all of the above answers are "NO", then may proceed with Cephalosporin use.'  . Shellfish Allergy Anaphylaxis  .  Sulfonamide Derivatives Anaphylaxis and Other (See Comments)    REACTION: internal "burning"  . Hydrochlorothiazide W-Triamterene Other (See Comments)    Hypokalemia  . Lotensin [Benazepril Hcl] Hives  . Dipyridamole Other (See Comments)    Unknown reaction  . Estrogens Other (See Comments)    Unknown reaction  . Hydrochlorothiazide Other (See Comments)    Unknown reaction  . Metronidazole Other (See Comments)    Unknown reaction  . Other     Pickles-itching  . Spironolactone Other (See Comments)    UNSPECIFIED > "kidney problems"  . Sulfa Antibiotics Other (See Comments)    Unknown reaction  . Torsemide Other (See Comments)    Unknown reaction  . Valsartan Other (See Comments)    Unknown reaction  . Madelaine Bhat Isothiocyanate] Itching    Family History  Problem Relation Age of Onset  .  Colon cancer Mother   . Prostate cancer Father   . Colon cancer Father   . Diabetes Maternal Aunt   . Breast cancer Maternal Aunt   . Diabetes Maternal Uncle   . Diabetes Paternal Aunt   . Stroke Paternal Aunt        > 65  . Heart disease Paternal Aunt   . Diabetes Paternal Uncle   . Breast cancer Maternal Aunt         X 2  . Breast cancer Cousin     Prior to Admission medications   Medication Sig Start Date End Date Taking? Authorizing Provider  albuterol (PROVENTIL) (2.5 MG/3ML) 0.083% nebulizer solution Take 3 mLs (2.5 mg total) by nebulization every 6 (six) hours as needed for wheezing or shortness of breath. 03/23/17   [name], MD  allopurinol (ZYLOPRIM) 100 MG tablet Take 1 tablet (100 mg total) by mouth daily. 08/29/18   [name], MD  aspirin (ECOTRIN LOW STRENGTH) 81 MG EC tablet Take 81 mg by mouth at bedtime.     [provider]  budesonide-formoterol (SYMBICORT) 160-4.5 MCG/ACT inhaler one - two inhalations every 12 hours; gargle and spit after use Patient taking differently: Inhale 2 puffs into the lungs 2 (two) times daily as needed (shortness of  breath). gargle and spit after use 02/19/15   Burns, Claudina Lick, MD  calcium carbonate (TUMS - DOSED IN MG ELEMENTAL CALCIUM) 500 MG chewable tablet Chew 2 tablets by mouth daily as needed for indigestion or heartburn.    [provider]  carvedilol (COREG) 25 MG tablet TAKE 1 TABLET BY MOUTH TWICE DAILY WITH A MEAL Patient taking differently: Take 25 mg by mouth daily.  07/18/18   [name], MD  colchicine 0.6 MG tablet Take 1 tablet (0.6 mg total) by mouth daily. 10/28/18   [name], MD  collagenase (SANTYL) ointment Apply topically daily. 09/16/18   Nita Sells, MD  cyanocobalamin (,VITAMIN B-12,) 1000 MCG/ML injection INJECT 1ML INTO MUSCLE EVERY 30 DAYS Patient taking differently: Inject 1,000 mcg into the muscle every 30 (thirty) days.  05/06/18   [name], MD  diclofenac sodium (VOLTAREN) 1 % GEL Apply 2 g topically 4 (four) times daily. Patient taking differently: Apply 2 g topically 4 (four) times daily as needed (pain).  03/20/16   Marcial Pacas, MD  EPINEPHrine 0.3 mg/0.3 mL IJ SOAJ injection Inject 0.3 mLs (0.3 mg total) into the muscle once as needed for anaphylaxis. 08/12/18   Kennith Gain, MD  famotidine (PEPCID) 20 MG tablet Take 1 tablet (20 mg total) by mouth 2 (two) times daily. 12/14/17   [name], MD  folic acid (FOLVITE) 355 MCG tablet Take 400 mcg by mouth at bedtime.     [provider]  furosemide (LASIX) 40 MG tablet Take 120 mg by mouth 2 (two) times daily.    [provider]  furosemide (LASIX) 80 MG tablet Take 1-1.5 tablets (80-120 mg total) by mouth 2 (two) times daily. 1.5 tablets in morning, 1.5 tablet in afternoon 11/07/18   [name], MD  gabapentin (NEURONTIN) 100 MG capsule Take 1 capsule (100 mg total) by mouth 3 (three) times daily. 11/07/18 12/07/18  [name], MD  hydrOXYzine (ATARAX/VISTARIL) 25 MG tablet Take 25 mg by mouth every 6 (six) hours as needed for itching.     [provider]   montelukast (SINGULAIR) 10 MG tablet Take 1 tablet (10 mg total)  by mouth at bedtime. 08/12/18   Kennith Gain, MD  Omega-3 Fatty Acids (FISH OIL) 1000 MG CAPS Take 1,000 mg by mouth daily.     [provider]  Oxcarbazepine (TRILEPTAL) 300 MG tablet Take 1 tablet (300 mg total) by mouth 2 (two) times daily. 11/07/18   [name], MD  oxyCODONE-acetaminophen (PERCOCET) 5-325 MG tablet Take 1 tablet by mouth every 8 (eight) hours as needed for moderate pain or severe pain. 11/03/18 11/03/19  [name], MD  permethrin (ELIMITE) 5 % cream Apply 1 application topically as needed. Hives and rash on arms and chest 08/13/18   [provider]  potassium chloride SA (KLOR-CON) 20 MEQ tablet Take 20 mEq by mouth 3 (three) times daily.    [provider]  silver sulfADIAZINE (SILVADENE) 1 % cream Apply 1 application topically daily. 06/08/18   [name], MD  simvastatin (ZOCOR) 40 MG tablet Take 1 tablet (40 mg total) by mouth at bedtime. 04/25/18   [name], MD  spironolactone (ALDACTONE) 25 MG tablet TAKE 1 TABLET BY MOUTH ONCE DAILY Patient taking differently: Take 25 mg by mouth daily.  10/24/18   [name], MD    Physical Exam: Vitals:   11/09/18 1545 11/09/18 1600 11/09/18 1615 11/09/18 1630  BP:  135/86  131/65  Pulse: 68 68 63 68  Resp: 14 15 14 13   Temp:      TempSrc:      SpO2: 96% 96% 95% 95%    Physical Exam  Constitutional: She is oriented to person, place, and time. She appears well-developed and well-nourished. No distress.  Morbidly obese  HENT:  Head: Normocephalic.  Mouth/Throat: Oropharynx is clear and moist.  Eyes: Right eye exhibits no discharge. Left eye exhibits no discharge.  Neck: Neck supple.  Cardiovascular: Normal rate, regular rhythm and intact distal pulses.  Pulmonary/Chest: Effort normal. No respiratory distress. She has no rales.  Very mild scattered end expiratory wheezing No increased work of breathing   Abdominal: Soft. Bowel sounds are normal. She exhibits no distension. There is no abdominal tenderness.  Musculoskeletal:        General: Edema present.     Comments: Lymphedema of bilateral lower extremities Right lower extremity ulcer without signs of infection.  Please see image.  Neurological: She is alert and oriented to person, place, and time.  Skin: Skin is warm and dry. She is not diaphoretic.     Labs on Admission: I have personally reviewed following labs and imaging studies  CBC: Recent Labs  Lab 11/09/18 1140  WBC 6.7  NEUTROABS 4.9  HGB 11.1*  HCT 36.7  MCV 94.8  PLT 161   Basic Metabolic Panel: Recent Labs  Lab 11/09/18 1140  NA 138  K 3.4*  CL 97*  CO2 29  GLUCOSE 134*  BUN 34*  CREATININE 1.96*  CALCIUM 9.1   GFR: Estimated Creatinine Clearance: 41.6 mL/min (A) (by C-G formula based on SCr of 1.96 mg/dL (H)). Liver Function Tests: No results for input(s): AST, ALT, ALKPHOS, BILITOT, PROT, ALBUMIN in the last 168 hours. No results for input(s): LIPASE, AMYLASE in the last 168 hours. No results for input(s): AMMONIA in the last 168 hours. Coagulation Profile: No results for input(s): INR, PROTIME in the last 168 hours. Cardiac Enzymes: No results for input(s): CKTOTAL, CKMB, CKMBINDEX, TROPONINI in the last 168 hours. BNP (last 3 results) No results for input(s): PROBNP in the last 8760 hours. HbA1C: No results  for input(s): HGBA1C in the last 72 hours. CBG: Recent Labs  Lab 11/09/18 1027  GLUCAP 127*   Lipid Profile: No results for input(s): CHOL, HDL, LDLCALC, TRIG, CHOLHDL, LDLDIRECT in the last 72 hours. Thyroid Function Tests: No results for input(s): TSH, T4TOTAL, FREET4, T3FREE, THYROIDAB in the last 72 hours. Anemia Panel: No results for input(s): VITAMINB12, FOLATE, FERRITIN, TIBC, IRON, RETICCTPCT in the last 72 hours. Urine analysis:    Component Value Date/Time   COLORURINE YELLOW 11/09/2018 1829   APPEARANCEUR TURBID (A)  11/09/2018 1829   LABSPEC 1.012 11/09/2018 1829   PHURINE 5.0 11/09/2018 1829   GLUCOSEU NEGATIVE 11/09/2018 1829   GLUCOSEU NEGATIVE 02/19/2015 1707   HGBUR SMALL (A) 11/09/2018 1829   BILIRUBINUR NEGATIVE 11/09/2018 1829   BILIRUBINUR negative 09/05/2013 [place] 11/09/2018 1829   PROTEINUR 30 (A) 11/09/2018 1829   UROBILINOGEN 2.0 (A) 02/19/2015 1707   NITRITE NEGATIVE 11/09/2018 1829   LEUKOCYTESUR LARGE (A) 11/09/2018 1829    Radiological Exams on Admission: Dg Chest Portable 1 View  Result Date: 11/09/2018 CLINICAL DATA:  Hypoxemia EXAM: PORTABLE CHEST 1 VIEW COMPARISON:  08/26/2018 FINDINGS: Cardiomediastinal contours appear stable. Calcific aortic knob. Lung volumes are low. Prominence of the bilateral perihilar interstitial markings. No focal airspace consolidation, pleural effusion, or pneumothorax. IMPRESSION: Mild prominence of the bilateral perihilar interstitial markings, likely accentuated by low lung volumes. Otherwise, no focal consolidation. Consider repeat inspiratory PA and lateral radiographs of the chest if further evaluation is clinically warranted. Electronically Signed   By: Davina Poke M.D.   On: 11/09/2018 11:50    EKG: Independently reviewed.  Sinus rhythm, PVCs.  Assessment/Plan Principal Problem:   Chronic venous hypertension (idiopathic) with ulcer of right lower extremity (CODE) (HCC) Active Problems:   Hypokalemia   AKI (acute kidney injury) ([place])   UTI (urinary tract infection)   Right hip pain   Idiopathic chronic venous hypertension of the right lower extremity with ulceration Status post debridement and skin grafting in April 2020.  Followed by Dr. [name].  Ulcer does not appear infected at present.  No fever or leukocytosis.  Patient is having problems with mobility secondary to this wound and is interested in SNF placement. -Tramadol PRN, Tylenol PRN pain -Wound care consult -PT evaluation -Social work consult for SNF  placement  UTI Afebrile no leukocytosis.  No signs of sepsis.  Patient endorses dysuria. UA with large amount of leukocytes, greater than 50 WBCs, and many bacteria on microscopic examination. -Does have penicillin allergy listed.  Received ceftriaxone in the ED and has tolerated it well.  Continue ceftriaxone. -Urine culture  Right hip pain Patient is complaining of pain in her right hip which is limiting her mobility. -Right hip x-ray -Tramadol PRN, Tylenol PRN pain  Mild hypokalemia Potassium 3.4.   -Replete potassium.  Check magnesium level and replete if low.  Continue to monitor electrolytes.  AKI BUN 34, creatinine 1.9.  Baseline creatinine 1.4-1.5.  Possibly prerenal due to dehydration. -IV fluid hydration -Continue to monitor renal function -Monitor urine output  Asthma Has very mild mild scattered end expiratory wheezing on exam.  No increased work of breathing, tachypnea, or hypoxia. -Combivent every 6 hours -Albuterol MDI every 2 hours as needed  CHF Stable.  Has significant bilateral lower extremity edema secondary to lymphedema.  BNP normal.  Chest x-ray without pulmonary congestion.  Well-controlled diet controlled type 2 diabetes A1c 6.0 on 09/08/2018. -Sliding scale insulin sensitive and CBG checks  Hypertension -Currently normotensive.  Hydralazine PRN.  Pharmacy med rec pending.  DVT prophylaxis: Lovenox Code Status: Patient wishes to be full code. Family Communication: No family available. Disposition Plan: Anticipate discharge in 1 to 2 days. Consults called: Social work, PT Admission status: It is my clinical opinion that referral for OBSERVATION is reasonable and necessary in this patient based on the above information provided. The aforementioned taken together are felt to place the patient at high risk for further clinical deterioration. However it is anticipated that the patient may be medically stable for discharge from the hospital within 24 to 48  hours.  The medical decision making on this patient was of high complexity and the patient is at high risk for clinical deterioration, therefore this is a level 3 visit.   Shela Leff MD [facility] Pager [phone]  If 7PM-7AM, please contact night-coverage [email or web address] Password [facility]  11/09/2018, 8:28 PM

## 2018-11-09 NOTE — ED Notes (Signed)
Pt. Documented in error see above note in chart. 

## 2018-11-09 NOTE — Plan of Care (Signed)
Plan of care discussed.   

## 2018-11-09 NOTE — Progress Notes (Signed)
CSW reviewed chart and sees pt was at [facility] in January, returned to the [facility] four times since and each time (April, May, July and August) refused SNF placement which was offered due to the pt's concerns about COVID within the SNF setting.   Per notes, pt's spouse does not seem to be documented as being heavily involved in the decision-making at these past visits.  CSW will continue to follow for D/C needs.  Alphonse Guild. Leanah Kolander, LCSW, LCAS, CSI Transitions of Care Clinical Social Worker Care Coordination Department Ph: [phone]

## 2018-11-09 NOTE — ED Notes (Signed)
Pt placed on pure wick. Suction set to 53mmHg.

## 2018-11-09 NOTE — TOC Initial Note (Signed)
Transition of Care [facility]) - Initial/Assessment Note    Patient Details  Name: April [name] MRN: [id number] Date of Birth: 02-Nov-1950  Transition of [facility]) CM/SW Contact:    Claudine Mouton, LCSW Phone Number: 11/09/2018, 8:13 PM  Clinical Narrative:         CSW spoke to pt to determine her wishes and pt stated her goal was to be able to walk again as her sudden inability to walk well at all has her greatly concerned.  Pt states she will be willing to privately pay if insurance does not.    CSW spoke to EDP who agrees pt may not be a safe D/C due to pt's inability to walk and is agreeable to placement attempt for safety.   Pt spoke to pt's husband who would not say he was agreeable to private pay until the insurance is actually denied, but did say he is willing to "go along with" whatever his wife wants.  Pt's husband provided permission for the CSW to create FL-2 and send referral out to the [place] area via the hub.  Pt spoke to pt again and pt provided permission for the CSW to create FL-2 and send referral out to the [place] area via the hub.  CSW spoke to EDP who will initiate a 12-hour COVID test, a PT consult and will sign the pt's FL-2.  Pt was last at [facility] for rehab.          Expected Discharge Plan: Skilled Nursing Facility Barriers to Discharge: Continued Medical Work up   Patient Goals and CMS Choice Patient states their goals for this hospitalization and ongoing recovery are:: "To walk well again".      Expected Discharge Plan and Services Expected Discharge Plan: [place]                                              Prior Living Arrangements/Services   Lives with:: Spouse Patient language and need for interpreter reviewed:: No Do you feel safe going back to the place where you live?: No      Need for Family Participation in Patient Care: Yes (Comment) Care giver support system in  place?: Yes (comment) Current home services: Home PT, Home OT(Pt has [facility])    Activities of Daily Living      Permission Sought/Granted Permission sought to share information with : Chartered certified accountant granted to share information with : Yes, Verbal Permission Granted              Emotional Assessment Appearance:: Appears older than stated age   Affect (typically observed): Calm, Apprehensive, Depressed, Hopeful Orientation: : Oriented to Self, Oriented to Place, Oriented to  Time, Oriented to Situation      Admission diagnosis:  leg pain Patient Active Problem List   Diagnosis Date Noted  . Chronic venous hypertension (idiopathic) with ulcer of right lower extremity (CODE) ([place]) 11/09/2018  . Achilles tendon sprain, left, initial encounter   . Osteomyelitis of ankle or foot, acute, left ([place]) 09/07/2018  . Osteomyelitis of ankle or foot, left, acute ([place]) 09/07/2018  . Acute on chronic kidney failure ([place]) 08/15/2018  . Onychomycosis 05/19/2018  . Idiopathic chronic venous hypertension of right lower extremity with ulcer and inflammation ([place]) 05/19/2018  . Traumatic wound dehiscence   . Skin lumps 01/14/2018  .  Bilateral leg cramps 12/14/2017  . Left shoulder pain 08/14/2016  . Meralgia paresthetica 07/16/2016  . Chronic left-sided low back pain with left-sided sciatica 06/02/2016  . Whole body pain 03/20/2016  . Diabetic peripheral neuropathy ([place]) 03/20/2016  . Osteopenia 01/11/2016  . CKD (chronic kidney disease) stage 3, GFR 30-59 ml/min (HCC) 01/02/2016  . Hand paresthesia 07/18/2015  . Carpal tunnel syndrome 06/07/2015  . Family history of colon cancer   . Diabetes mellitus with neurological manifestations ([place]) 04/18/2015  . Bilateral leg edema 04/11/2015  . Abnormality of gait 01/03/2015  . Hereditary and idiopathic peripheral neuropathy 01/03/2015  . GERD (gastroesophageal reflux disease) 06/17/2014  . Hx of colonic polyps  12/15/2012  . Vitamin B12 deficiency 11/03/2012  . Intrinsic asthma 03/23/2012  . Chronic diastolic heart failure ([place]) 02/20/2011  . OSA (obstructive sleep apnea) 09/17/2010  . URINARY URGENCY 01/08/2010  . CAD, NATIVE VESSEL 11/20/2008  . Osteoarthritis of knees, bilateral 06/14/2008  . Hyperlipidemia 05/10/2007  . Essential hypertension 01/18/2007  . Morbid (severe) obesity due to excess calories ([place]) 04/30/2006  . HX, PERSONAL, PEPTIC ULCER DISEASE 04/30/2006   PCP:  [name], MD Pharmacy:   [facility] [place], [place] - [address] [place] [address] Phone: [phone] Fax: [phone]     Social Determinants of Health (SDOH) Interventions    Readmission Risk Interventions No flowsheet data found.

## 2018-11-09 NOTE — ED Provider Notes (Signed)
I received the patient in signout from Dr. Wilson Singer.  Briefly the patient is a 68 year old female who has had some worsening disability.  She has chronic lymphedema and has had some difficulty getting around at home.  He evaluated her, and it was felt that she likely was not much worse than her baseline and were planning on discharging her home.  It is noted that she had some significant periods of somnolence off and on.  Per the husband this is new over the past few days.  Is also new according to him that she is not able to be able to transfer with him.  She has been too weak.  It was noted that her urine appeared to be purulent.  She has large leukocyte Estrace continuous to count bacteria.  She is complaining of some dysuria and frequency.  Will discuss with the hospitalist for possible admission.   Deno Etienne, DO 11/09/18 2059

## 2018-11-09 NOTE — NC FL2 (Signed)
[place] LEVEL OF CARE SCREENING TOOL     IDENTIFICATION  Patient Name: April [name] Birthdate: 25-Aug-1950 Sex: female Admission Date (Current Location): 11/09/2018  [facility] and [place] Number:  Herbalist and Address:  [facility],  [place] [address], [place]      Provider Number: [id number]  Attending Physician Name and Address:  Default, Provider, MD  Relative Name and Phone Number:       Current Level of Care: Hospital Recommended Level of Care: [place] Prior Approval Number:    Date Approved/Denied:   PASRR Number: [id number]  Discharge Plan: SNF    Current Diagnoses: Patient Active Problem List   Diagnosis Date Noted  . Achilles tendon sprain, left, initial encounter   . Osteomyelitis of ankle or foot, acute, left ([place]) 09/07/2018  . Osteomyelitis of ankle or foot, left, acute ([place]) 09/07/2018  . Acute on chronic kidney failure ([place]) 08/15/2018  . Onychomycosis 05/19/2018  . Idiopathic chronic venous hypertension of right lower extremity with ulcer and inflammation ([place]) 05/19/2018  . Traumatic wound dehiscence   . Skin lumps 01/14/2018  . Bilateral leg cramps 12/14/2017  . Left shoulder pain 08/14/2016  . Meralgia paresthetica 07/16/2016  . Chronic left-sided low back pain with left-sided sciatica 06/02/2016  . Whole body pain 03/20/2016  . Diabetic peripheral neuropathy ([place]) 03/20/2016  . Osteopenia 01/11/2016  . CKD (chronic kidney disease) stage 3, GFR 30-59 ml/min (HCC) 01/02/2016  . Hand paresthesia 07/18/2015  . Carpal tunnel syndrome 06/07/2015  . Family history of colon cancer   . Diabetes mellitus with neurological manifestations ([place]) 04/18/2015  . Bilateral leg edema 04/11/2015  . Abnormality of gait 01/03/2015  . Hereditary and idiopathic peripheral neuropathy 01/03/2015  . GERD (gastroesophageal reflux disease) 06/17/2014  . Hx of colonic polyps 12/15/2012  . Vitamin B12  deficiency 11/03/2012  . Intrinsic asthma 03/23/2012  . Chronic diastolic heart failure ([place]) 02/20/2011  . OSA (obstructive sleep apnea) 09/17/2010  . URINARY URGENCY 01/08/2010  . CAD, NATIVE VESSEL 11/20/2008  . Osteoarthritis of knees, bilateral 06/14/2008  . Hyperlipidemia 05/10/2007  . Essential hypertension 01/18/2007  . Morbid (severe) obesity due to excess calories ([place]) 04/30/2006  . HX, PERSONAL, PEPTIC ULCER DISEASE 04/30/2006    Orientation RESPIRATION BLADDER Height & Weight     Self, Situation, Place  Normal Incontinent Weight:   Height:     BEHAVIORAL SYMPTOMS/MOOD NEUROLOGICAL BOWEL NUTRITION STATUS      Incontinent Diet(Carb-modified, heart-healthy)  AMBULATORY STATUS COMMUNICATION OF NEEDS Skin   Extensive Assist Verbally (Right leg wound (non-healing due to diabetes) from a fall, palm of hand-sized)                       Personal Care Assistance Level of Assistance  Bathing, Feeding, Dressing Bathing Assistance: Limited assistance Feeding assistance: Limited assistance Dressing Assistance: Limited assistance     Functional Limitations Info             [place]  PT (By licensed PT), OT (By licensed OT)     PT Frequency: 5 OT Frequency: 5            Contractures Contractures Info: Not present    Additional Factors Info  Allergies, Code Status:   Allergies: Penicillins, Shellfish Allergy, Sulfonamide Derivatives, Hydrochlorothiazide W-triamterene, Lotensin [Benazepril Hcl], Dipyridamole, Estrogens, Hydrochlorothiazide, Metronidazole, Other, Spironolactone, Sulfa Antibiotics, Torsemide, Valsartan, Mustard [Allyl Isothiocyanate]    FULL CODE  Current Medications (11/09/2018):  This is the current hospital active medication list No current facility-administered medications for this encounter.    Current Outpatient Medications  Medication Sig Dispense Refill  . albuterol (PROVENTIL) (2.5 MG/3ML) 0.083%  nebulizer solution Take 3 mLs (2.5 mg total) by nebulization every 6 (six) hours as needed for wheezing or shortness of breath. 75 mL 12  . allopurinol (ZYLOPRIM) 100 MG tablet Take 1 tablet (100 mg total) by mouth daily. 90 tablet 1  . aspirin (ECOTRIN LOW STRENGTH) 81 MG EC tablet Take 81 mg by mouth at bedtime.     . budesonide-formoterol (SYMBICORT) 160-4.5 MCG/ACT inhaler one - two inhalations every 12 hours; gargle and spit after use (Patient taking differently: Inhale 2 puffs into the lungs 2 (two) times daily as needed (shortness of breath). gargle and spit after use) 1 Inhaler 4  . calcium carbonate (TUMS - DOSED IN MG ELEMENTAL CALCIUM) 500 MG chewable tablet Chew 2 tablets by mouth daily as needed for indigestion or heartburn.    . carvedilol (COREG) 25 MG tablet TAKE 1 TABLET BY MOUTH TWICE DAILY WITH A MEAL (Patient taking differently: Take 25 mg by mouth daily. ) 60 tablet 5  . colchicine 0.6 MG tablet Take 1 tablet (0.6 mg total) by mouth daily. 90 tablet 1  . collagenase (SANTYL) ointment Apply topically daily. 15 g 0  . cyanocobalamin (,VITAMIN B-12,) 1000 MCG/ML injection INJECT 1ML INTO MUSCLE EVERY 30 DAYS (Patient taking differently: Inject 1,000 mcg into the muscle every 30 (thirty) days. ) 10 mL 0  . diclofenac sodium (VOLTAREN) 1 % GEL Apply 2 g topically 4 (four) times daily. (Patient taking differently: Apply 2 g topically 4 (four) times daily as needed (pain). ) 100 g 11  . EPINEPHrine 0.3 mg/0.3 mL IJ SOAJ injection Inject 0.3 mLs (0.3 mg total) into the muscle once as needed for anaphylaxis. 2 mL 1  . famotidine (PEPCID) 20 MG tablet Take 1 tablet (20 mg total) by mouth 2 (two) times daily. 093 tablet 3  . folic acid (FOLVITE) 235 MCG tablet Take 400 mcg by mouth at bedtime.     . furosemide (LASIX) 40 MG tablet Take 120 mg by mouth 2 (two) times daily.    . furosemide (LASIX) 80 MG tablet Take 1-1.5 tablets (80-120 mg total) by mouth 2 (two) times daily. 1.5 tablets in  morning, 1.5 tablet in afternoon 270 tablet 1  . gabapentin (NEURONTIN) 100 MG capsule Take 1 capsule (100 mg total) by mouth 3 (three) times daily. 90 capsule 0  . hydrOXYzine (ATARAX/VISTARIL) 25 MG tablet Take 25 mg by mouth every 6 (six) hours as needed for itching.     . montelukast (SINGULAIR) 10 MG tablet Take 1 tablet (10 mg total) by mouth at bedtime. 30 tablet 5  . Omega-3 Fatty Acids (FISH OIL) 1000 MG CAPS Take 1,000 mg by mouth daily.     . Oxcarbazepine (TRILEPTAL) 300 MG tablet Take 1 tablet (300 mg total) by mouth 2 (two) times daily. 60 tablet 0  . oxyCODONE-acetaminophen (PERCOCET) 5-325 MG tablet Take 1 tablet by mouth every 8 (eight) hours as needed for moderate pain or severe pain. 15 tablet 0  . permethrin (ELIMITE) 5 % cream Apply 1 application topically as needed. Hives and rash on arms and chest    . potassium chloride SA (KLOR-CON) 20 MEQ tablet Take 20 mEq by mouth 3 (three) times daily.    . silver sulfADIAZINE (SILVADENE) 1 % cream Apply  1 application topically daily. 400 g 0  . simvastatin (ZOCOR) 40 MG tablet Take 1 tablet (40 mg total) by mouth at bedtime. 90 tablet 1  . spironolactone (ALDACTONE) 25 MG tablet TAKE 1 TABLET BY MOUTH ONCE DAILY (Patient taking differently: Take 25 mg by mouth daily. ) 90 tablet 1     Discharge Medications: Please see discharge summary for a list of discharge medications.  Relevant Imaging Results:  Relevant Lab Results:   Additional Information Typre 2 Diabetic [id number]  Alphonse Guild Jeanine Caven, LCSW

## 2018-11-09 NOTE — ED Notes (Signed)
Patient has a hx of diabetes and neuropathy. Patient reports pain and numbness in her feet bilaterally and difficulty moving. Patient has a wound on her right extremity that is reportedly being treated by wound care. Patient reports her husband helps her take care of herself at home.

## 2018-11-09 NOTE — Progress Notes (Signed)
CSW received a call from pt's RN advising that pt is in the ED due to her weakened state which has resulted in the pt being unable to walk.  RN states she observed one wound on pt's leg, seemingly a diabetic wound.  RN states pt's spouse has refused (as of of RN's call) to pick the pt up because, "she can't walk".  Per RN, spouse brought pt in because pt is weaker than usual", as well as not being able to walk.  Pt has [employer] Medicare.  CSW will continue to follow for D/C needs.  Alphonse Guild. Kyona Chauncey, LCSW, LCAS, CSI Transitions of Care Clinical Social Worker Care Coordination Department Ph: [phone]

## 2018-11-09 NOTE — Progress Notes (Signed)
CSW spoke to pt to determine her wishes and pt stated her goal was to be able to walk again as her sudden inability to walk well at all has her greatly concerned.  Pt states she will be willing to privately pay if insurance does not.    CSW spoke to EDP who agrees pt may not be a safe D/C due to pt's inability to walk and is agreeable to placement attempt for safety.   Pt spoke to pt's husband who would not say he was agreeable to private pay until the insurance is actually denied, but did say he is willing to "go along with" whatever his wife wants.  Pt's husband provided permission for the CSW to create FL-2 and send referral out to the [place] area via the hub.  Pt spoke to pt again and pt provided permission for the CSW to create FL-2 and send referral out to the [place] area via the hub.  CSW spoke to EDP who will initiate a 12-hour COVID test, a PT consult and will sign the pt's FL-2.  CSW will continue to follow for D/C needs.  Alphonse Guild. Tawni Melkonian, LCSW, LCAS, CSI Transitions of Care Clinical Social Worker Care Coordination Department Ph: [phone]

## 2018-11-09 NOTE — Progress Notes (Signed)
CSW attempted to inform pt's spouse pt will be admitted for observation and left HIPPA-compliant VM.  CSW will continue to follow for D/C needs.  Alphonse Guild. Jailani Hogans, LCSW, LCAS, CSI Transitions of Care Clinical Social Worker Care Coordination Department Ph: [phone]

## 2018-11-09 NOTE — ED Triage Notes (Addendum)
EMS reports from home, called out for right leg pain since 0630. Pt non ambulatory. Pt c/o burning in foot.Wound on right ankle from fall in Jan, being treated by wound care and home health. Hx of chronic idiopathic venous hypertension of right lower extremity with ulcer and inflammation.  BP 138/82 HR 89 RR 16 Sp02 90 RA CBG 168 Temp 97.9

## 2018-11-09 NOTE — Progress Notes (Signed)
CSW called and spoke to pt's spouse.  Pt's spouse confirmed pt was at [facility] in January, returned home, too early in the pt's spouse's opinion, and has since been unable to walk as well.  Per pt's spouse, pt has a walker and a wheelchair.  Per pt's spouse, pt had an appointment to see the pt's doctor and was unable to stand and pt's [facility] nurse stated pt should go to the hospital.  Per pt's spouse, as of yesterday was, "all of a sudden very weak".  Prior to that pt was able to get up, albeit with help from her spouse.   Lately, per spouse, pt has been asking about people who are not there, seeing things", such as colors and numbers", etc.  Pt's spouse, states pt has been receiving tx for the wound on her leg which the pt's spouse states has been getting better.  Per pt's spouse once fell and broke her leg after getting up too quickly.  CSW will continue to follow for D/C needs.  Alphonse Guild. Aaralyn Kil, LCSW, LCAS, CSI Transitions of Care Clinical Social Worker Care Coordination Department Ph: [phone]

## 2018-11-10 ENCOUNTER — Inpatient Hospital Stay (HOSPITAL_COMMUNITY): Payer: Medicare Other

## 2018-11-10 ENCOUNTER — Other Ambulatory Visit: Payer: Medicare Other | Admitting: Orthotics

## 2018-11-10 ENCOUNTER — Observation Stay (HOSPITAL_COMMUNITY): Payer: Medicare Other

## 2018-11-10 DIAGNOSIS — J9601 Acute respiratory failure with hypoxia: Secondary | ICD-10-CM | POA: Diagnosis present

## 2018-11-10 DIAGNOSIS — E662 Morbid (severe) obesity with alveolar hypoventilation: Secondary | ICD-10-CM | POA: Diagnosis present

## 2018-11-10 DIAGNOSIS — N183 Chronic kidney disease, stage 3 unspecified: Secondary | ICD-10-CM | POA: Diagnosis present

## 2018-11-10 DIAGNOSIS — I130 Hypertensive heart and chronic kidney disease with heart failure and stage 1 through stage 4 chronic kidney disease, or unspecified chronic kidney disease: Secondary | ICD-10-CM | POA: Diagnosis present

## 2018-11-10 DIAGNOSIS — M1710 Unilateral primary osteoarthritis, unspecified knee: Secondary | ICD-10-CM | POA: Diagnosis present

## 2018-11-10 DIAGNOSIS — E876 Hypokalemia: Secondary | ICD-10-CM

## 2018-11-10 DIAGNOSIS — N179 Acute kidney failure, unspecified: Secondary | ICD-10-CM | POA: Diagnosis present

## 2018-11-10 DIAGNOSIS — M25551 Pain in right hip: Secondary | ICD-10-CM

## 2018-11-10 DIAGNOSIS — L97219 Non-pressure chronic ulcer of right calf with unspecified severity: Secondary | ICD-10-CM | POA: Diagnosis present

## 2018-11-10 DIAGNOSIS — Z6841 Body Mass Index (BMI) 40.0 and over, adult: Secondary | ICD-10-CM | POA: Diagnosis not present

## 2018-11-10 DIAGNOSIS — M79604 Pain in right leg: Secondary | ICD-10-CM

## 2018-11-10 DIAGNOSIS — Z90710 Acquired absence of both cervix and uterus: Secondary | ICD-10-CM | POA: Diagnosis not present

## 2018-11-10 DIAGNOSIS — I87301 Chronic venous hypertension (idiopathic) without complications of right lower extremity: Secondary | ICD-10-CM | POA: Diagnosis present

## 2018-11-10 DIAGNOSIS — E1122 Type 2 diabetes mellitus with diabetic chronic kidney disease: Secondary | ICD-10-CM | POA: Diagnosis present

## 2018-11-10 DIAGNOSIS — N3000 Acute cystitis without hematuria: Secondary | ICD-10-CM

## 2018-11-10 DIAGNOSIS — D509 Iron deficiency anemia, unspecified: Secondary | ICD-10-CM | POA: Diagnosis present

## 2018-11-10 DIAGNOSIS — N390 Urinary tract infection, site not specified: Secondary | ICD-10-CM | POA: Diagnosis present

## 2018-11-10 DIAGNOSIS — G8929 Other chronic pain: Secondary | ICD-10-CM | POA: Diagnosis present

## 2018-11-10 DIAGNOSIS — I5033 Acute on chronic diastolic (congestive) heart failure: Secondary | ICD-10-CM | POA: Diagnosis present

## 2018-11-10 DIAGNOSIS — E785 Hyperlipidemia, unspecified: Secondary | ICD-10-CM | POA: Diagnosis present

## 2018-11-10 DIAGNOSIS — I2510 Atherosclerotic heart disease of native coronary artery without angina pectoris: Secondary | ICD-10-CM | POA: Diagnosis present

## 2018-11-10 DIAGNOSIS — J4520 Mild intermittent asthma, uncomplicated: Secondary | ICD-10-CM | POA: Diagnosis present

## 2018-11-10 DIAGNOSIS — R52 Pain, unspecified: Secondary | ICD-10-CM | POA: Diagnosis not present

## 2018-11-10 DIAGNOSIS — Z1611 Resistance to penicillins: Secondary | ICD-10-CM | POA: Diagnosis present

## 2018-11-10 DIAGNOSIS — I87311 Chronic venous hypertension (idiopathic) with ulcer of right lower extremity: Secondary | ICD-10-CM | POA: Diagnosis present

## 2018-11-10 DIAGNOSIS — E873 Alkalosis: Secondary | ICD-10-CM | POA: Diagnosis present

## 2018-11-10 DIAGNOSIS — M7989 Other specified soft tissue disorders: Secondary | ICD-10-CM | POA: Diagnosis not present

## 2018-11-10 DIAGNOSIS — I5032 Chronic diastolic (congestive) heart failure: Secondary | ICD-10-CM

## 2018-11-10 DIAGNOSIS — Z20828 Contact with and (suspected) exposure to other viral communicable diseases: Secondary | ICD-10-CM | POA: Diagnosis present

## 2018-11-10 LAB — GLUCOSE, CAPILLARY
Glucose-Capillary: 104 mg/dL — ABNORMAL HIGH (ref 70–99)
Glucose-Capillary: 116 mg/dL — ABNORMAL HIGH (ref 70–99)
Glucose-Capillary: 102 mg/dL — ABNORMAL HIGH (ref 70–99)
Glucose-Capillary: 89 mg/dL (ref 70–99)

## 2018-11-10 LAB — CBC WITH DIFFERENTIAL/PLATELET
Abs Immature Granulocytes: 0.01 10*3/uL (ref 0.00–0.07)
Basophils Absolute: 0 10*3/uL (ref 0.0–0.1)
Basophils Relative: 1 %
Eosinophils Absolute: 0.2 10*3/uL (ref 0.0–0.5)
Eosinophils Relative: 5 %
HCT: 32.6 % — ABNORMAL LOW (ref 36.0–46.0)
Hemoglobin: 9.8 g/dL — ABNORMAL LOW (ref 12.0–15.0)
Immature Granulocytes: 0 %
Lymphocytes Relative: 28 %
Lymphs Abs: 1.4 10*3/uL (ref 0.7–4.0)
MCH: 28.7 pg (ref 26.0–34.0)
MCHC: 30.1 g/dL (ref 30.0–36.0)
MCV: 95.6 fL (ref 80.0–100.0)
Monocytes Absolute: 0.5 10*3/uL (ref 0.1–1.0)
Monocytes Relative: 10 %
Neutro Abs: 2.9 10*3/uL (ref 1.7–7.7)
Neutrophils Relative %: 56 %
Platelets: 179 10*3/uL (ref 150–400)
RBC: 3.41 MIL/uL — ABNORMAL LOW (ref 3.87–5.11)
RDW: 15 % (ref 11.5–15.5)
WBC: 5.1 10*3/uL (ref 4.0–10.5)
nRBC: 0 % (ref 0.0–0.2)

## 2018-11-10 LAB — BASIC METABOLIC PANEL
Anion gap: 13 (ref 5–15)
BUN: 27 mg/dL — ABNORMAL HIGH (ref 8–23)
CO2: 26 mmol/L (ref 22–32)
Calcium: 8.3 mg/dL — ABNORMAL LOW (ref 8.9–10.3)
Chloride: 101 mmol/L (ref 98–111)
Creatinine, Ser: 1.42 mg/dL — ABNORMAL HIGH (ref 0.44–1.00)
GFR calc Af Amer: 44 mL/min — ABNORMAL LOW (ref >60)
GFR calc non Af Amer: 38 mL/min — ABNORMAL LOW (ref >60)
Glucose, Bld: 91 mg/dL (ref 70–99)
Potassium: 3.7 mmol/L (ref 3.5–5.1)
Sodium: 140 mmol/L (ref 135–145)

## 2018-11-10 LAB — HEPATIC FUNCTION PANEL
ALT: 17 U/L (ref 0–44)
AST: 31 U/L (ref 15–41)
Albumin: 2.8 g/dL — ABNORMAL LOW (ref 3.5–5.0)
Alkaline Phosphatase: 72 U/L (ref 38–126)
Bilirubin, Direct: 0.1 mg/dL (ref 0.0–0.2)
Indirect Bilirubin: 0.4 mg/dL (ref 0.3–0.9)
Total Bilirubin: 0.5 mg/dL (ref 0.3–1.2)
Total Protein: 5.5 g/dL — ABNORMAL LOW (ref 6.5–8.1)

## 2018-11-10 LAB — SARS CORONAVIRUS 2 (TAT 6-24 HRS): SARS Coronavirus 2: NEGATIVE

## 2018-11-10 LAB — MAGNESIUM: Magnesium: 2.3 mg/dL (ref 1.7–2.4)

## 2018-11-10 LAB — PHOSPHORUS: Phosphorus: 4.1 mg/dL (ref 2.5–4.6)

## 2018-11-10 MED ORDER — FUROSEMIDE 40 MG PO TABS: 80.0000 mg | ORAL_TABLET | Freq: Two times a day (BID) | ORAL | Status: DC

## 2018-11-10 MED ORDER — SIMVASTATIN 40 MG PO TABS
40.0000 mg | ORAL_TABLET | Freq: Every day | ORAL | Status: DC
  Administered 2018-11-10 – 2018-11-15 (×6): 40 mg via ORAL
  Filled 2018-11-10 (×6): qty 1

## 2018-11-10 MED ORDER — MONTELUKAST SODIUM 10 MG PO TABS
10.0000 mg | ORAL_TABLET | Freq: Every day | ORAL | Status: DC
  Administered 2018-11-10 – 2018-11-15 (×6): 10 mg via ORAL
  Filled 2018-11-10 (×6): qty 1

## 2018-11-10 MED ORDER — SODIUM CHLORIDE 0.9 % IV SOLN
INTRAVENOUS | Status: DC
  Administered 2018-11-10 (×2): via INTRAVENOUS

## 2018-11-10 MED ORDER — FAMOTIDINE 20 MG PO TABS
20.0000 mg | ORAL_TABLET | Freq: Two times a day (BID) | ORAL | Status: DC
  Administered 2018-11-10 – 2018-11-16 (×12): 20 mg via ORAL
  Filled 2018-11-10 (×13): qty 1

## 2018-11-10 MED ORDER — GABAPENTIN 100 MG PO CAPS
100.0000 mg | ORAL_CAPSULE | Freq: Three times a day (TID) | ORAL | Status: DC
  Administered 2018-11-10 – 2018-11-16 (×15): 100 mg via ORAL
  Filled 2018-11-10 (×15): qty 1

## 2018-11-10 MED ORDER — OXCARBAZEPINE 300 MG PO TABS
300.0000 mg | ORAL_TABLET | Freq: Two times a day (BID) | ORAL | Status: DC
  Administered 2018-11-10 – 2018-11-16 (×12): 300 mg via ORAL
  Filled 2018-11-10 (×13): qty 1

## 2018-11-10 MED ORDER — FOLIC ACID 1 MG PO TABS
0.5000 mg | ORAL_TABLET | Freq: Every day | ORAL | Status: DC
  Administered 2018-11-10 – 2018-11-15 (×6): 0.5 mg via ORAL
  Filled 2018-11-10 (×6): qty 1

## 2018-11-10 MED ORDER — HYDROXYZINE HCL 25 MG PO TABS
25.0000 mg | ORAL_TABLET | Freq: Four times a day (QID) | ORAL | Status: DC | PRN
  Administered 2018-11-10 – 2018-11-16 (×9): 25 mg via ORAL
  Filled 2018-11-10 (×9): qty 1

## 2018-11-10 MED ORDER — MOMETASONE FURO-FORMOTEROL FUM 200-5 MCG/ACT IN AERO
2.0000 | INHALATION_SPRAY | Freq: Two times a day (BID) | RESPIRATORY_TRACT | Status: DC
  Administered 2018-11-10 – 2018-11-16 (×12): 2 via RESPIRATORY_TRACT
  Filled 2018-11-10: qty 8.8

## 2018-11-10 MED ORDER — OXYCODONE-ACETAMINOPHEN 5-325 MG PO TABS
1.0000 | ORAL_TABLET | Freq: Three times a day (TID) | ORAL | Status: DC | PRN
  Administered 2018-11-10 – 2018-11-16 (×12): 1 via ORAL
  Filled 2018-11-10 (×14): qty 1

## 2018-11-10 NOTE — TOC Progression Note (Signed)
Transition of Care [facility]) - Progression Note    Patient Details  Name: April [name] MRN: [id number] Date of Birth: 25-Nov-1950  Transition of Care [facility] CM/SW Contact  Shade Flood, LCSW Phone Number: 11/10/2018, 3:07 PM  Clinical Narrative:     Met with pt today to follow up on TOC needs. Pt continues to be interested in SNF rehab at this time. She plans to call her insurance to see what her benefits are. Discussed bed offers with pt who states she would like to select [facility].   Notified admissions at [facility]. Will start insurance auth.  TOC will follow.   Expected Discharge Plan: Skilled Nursing Facility Barriers to Discharge: Continued Medical Work up  Expected Discharge Plan and Services Expected Discharge Plan: [place]                                               Social Determinants of Health (SDOH) Interventions    Readmission Risk Interventions No flowsheet data found.

## 2018-11-10 NOTE — Evaluation (Signed)
Physical Therapy Evaluation Patient Details Name: April [name] MRN: [id number] DOB: 1951/01/13 Today's Date: 11/10/2018   History of Present Illness  Pt is a 68 y/o female admitted secondary to weakness and inability to walk.   On recent hospital stay in 8/20, pt with RLE pain at baseline. Imaging revealed L calcaneous osteomyelitis. Pt also with likely L achilles strain vs partial tear. PMH includes obesity, CAD, asthma, HTN, and DM.   Clinical Impression  PT admitted as above and presenting with functional mobility limitations 2* generalized weakness, bilat LE pain, and morbid obesity.  At this time pt requiring increased time and significant assist of 2 for all functional mobility activities.  Pt unable to progress beyond bedside sitting 2* severe bil foot pain with resting on floor.  Pt would benefit from follow up rehab at SNF level to maximize IND and safety prior to return home with intermittent assist.    Follow Up Recommendations SNF    Equipment Recommendations  None recommended by PT    Recommendations for Other Services       Precautions / Restrictions Precautions Precautions: Fall Restrictions Weight Bearing Restrictions: No      Mobility  Bed Mobility Overal bed mobility: Needs Assistance Bed Mobility: Supine to Sit;Sit to Supine     Supine to sit: Mod assist;+2 for physical assistance;+2 for safety/equipment;HOB elevated Sit to supine: Max assist;+2 for physical assistance;+2 for safety/equipment   General bed mobility comments: Pt utilizing bed rails extensively to assist.    Transfers                 General transfer comment: NT 2* severe bilat foot pain with just resting feet on floor  Ambulation/Gait                Stairs            Wheelchair Mobility    Modified Rankin (Stroke Patients Only)       Balance Overall balance assessment: Needs assistance Sitting-balance support: Bilateral upper extremity supported;Feet  supported Sitting balance-Leahy Scale: Fair Sitting balance - Comments: pt ltd by poor tolerance for WB on feet even in sitting                                     Pertinent Vitals/Pain Pain Assessment: 0-10 Pain Score: 10-Worst pain ever Pain Location: bil feet when in contact with the floor Pain Descriptors / Indicators: Aching;Burning;Shooting;Grimacing;Guarding Pain Intervention(s): Limited activity within patient's tolerance;Monitored during session;Patient requesting pain meds-RN notified    Home Living Family/patient expects to be discharged to:: Private residence Living Arrangements: Spouse/significant other Available Help at Discharge: Family;Available PRN/intermittently Type of Home: House Home Access: Stairs to enter Entrance Stairs-Rails: Right;Left Entrance Stairs-Number of Steps: 2 Home Layout: Two level;Able to live on main level with bedroom/bathroom Home Equipment: Gilford Rile - 2 wheels;Cane - quad;Wheelchair - manual;Bedside commode Additional Comments: family works, can assist in morning and pm, needs to be able to transfer and manage toilet to go home    Prior Function Level of Independence: Needs assistance   Gait / Transfers Assistance Needed: pt reports limited household ambulator with RW. Uses WC when outside of the house. Needs help to get into/out of car  ADL's / Homemaking Assistance Needed: assist for sponge bathing, LB dressing and IADL  Comments: pt primarily transfers to from her chair to the [facility] during the day, her family sets her up  for everything she needs while they are at work     Hand Dominance   Dominant Hand: Right    Extremity/Trunk Assessment   Upper Extremity Assessment Upper Extremity Assessment: Overall WFL for tasks assessed    Lower Extremity Assessment Lower Extremity Assessment: Generalized weakness;RLE deficits/detail;LLE deficits/detail RLE: Unable to fully assess due to pain RLE Sensation: history of  peripheral neuropathy LLE: Unable to fully assess due to pain LLE Sensation: history of peripheral neuropathy       Communication   Communication: No difficulties  Cognition Arousal/Alertness: Awake/alert Behavior During Therapy: WFL for tasks assessed/performed Overall Cognitive Status: Within Functional Limits for tasks assessed                                        General Comments      Exercises     Assessment/Plan    PT Assessment Patient needs continued PT services  PT Problem List Decreased strength;Decreased activity tolerance;Decreased balance;Decreased mobility;Decreased knowledge of use of DME;Pain;Obesity       PT Treatment Interventions DME instruction;Gait training;Stair training;Functional mobility training;Therapeutic activities;Therapeutic exercise;Balance training;Patient/family education    PT Goals (Current goals can be found in the Care Plan section)  Acute Rehab PT Goals Patient Stated Goal: less pain PT Goal Formulation: With patient Time For Goal Achievement: 11/24/18 Potential to Achieve Goals: Fair    Frequency Min 3X/week   Barriers to discharge Decreased caregiver support family works during the day    Co-evaluation               AM-PAC PT "6 Clicks" Mobility  Outcome Measure Help needed turning from your back to your side while in a flat bed without using bedrails?: A Lot Help needed moving from lying on your back to sitting on the side of a flat bed without using bedrails?: A Lot Help needed moving to and from a bed to a chair (including a wheelchair)?: Total Help needed standing up from a chair using your arms (e.g., wheelchair or bedside chair)?: Total Help needed to walk in hospital room?: Total Help needed climbing 3-5 steps with a railing? : Total 6 Click Score: 8    End of Session   Activity Tolerance: Patient limited by pain Patient left: in bed;with call bell/phone within reach;with bed alarm  set Nurse Communication: Mobility status;Need for lift equipment PT Visit Diagnosis: Muscle weakness (generalized) (M62.81);Difficulty in walking, not elsewhere classified (R26.2);Pain Pain - Right/Left: Left    Time: 1040-1105 PT Time Calculation (min) (ACUTE ONLY): 25 min   Charges:   PT Evaluation $PT Eval Low Complexity: 1 Low PT Treatments $Therapeutic Activity: 8-22 mins        Debe Coder PT Acute Rehabilitation Services Pager [phone] Office [phone]   Brailon Don 11/10/2018, 12:57 PM

## 2018-11-10 NOTE — Care Management Obs Status (Signed)
MEDICARE OBSERVATION STATUS NOTIFICATION   Patient Details  Name: April [name] MRN: [id number] Date of Birth: [birth year]   Medicare Observation Status Notification Given:  Yes    Shade Flood, LCSW 11/10/2018, 3:10 PM

## 2018-11-10 NOTE — Consult Note (Addendum)
[facility] Nurse wound consult note Reason for Consult: Consult requested for right leg wound. [place] team is working remotely; reviewed photos and progress notes in the EMR. Pt is familiar to the [place] team from a previous admission.  She has a chronic full thickness stasis ulcer to right calf and is followed by Dr [name] of the ortho service.  He last assessed the wound on 9/16 and felt that it was making progress, according to the EMR.  Patient has been using Aquacel and light compression.  Wound bed: 100% red and moist Periwound: Generalized edema and erythemia Dressing procedure/placement/frequency: Continue present plan of care with Aquacel to absorb drainage and provide antimicrobial benefits. Pt should resume follow-up with Dr [name] after discharge. Topical treatment orders provided for bedside nurse to perform daily as follows: Apply Aquacel Kellie Simmering # [phone]) to right leg wound Q day, then cover with abd pad.  Apply kerlex in a spiral fashion, beginning from just behind toes, to below knees, then ace wrap in the same manner.  Moisten Aquacel with NS to remove dressing each time. Please re-consult if further assistance is needed.  Thank-you,  Julien Girt MSN, [place], [place], [place], [place]

## 2018-11-10 NOTE — Progress Notes (Signed)
PROGRESS NOTE    April [name]  WRU:[id number] DOB: 04/08/1950 DOA: 11/09/2018 PCP: [name], MD   Brief Narrative:  HPI per Dr. Shela Leff on 11/09/2018 April [name] is a 68 y.o. female with medical history significant of idiopathic chronic venous hypertension of the right lower extremity with ulceration status post debridement and skin grafting in April 2020, chronic lymphedema, anemia, asthma, CAD, CHF, CKD, diet-controlled type 2 diabetes, GERD, hypertension, hyperlipidemia, morbid obesity, neuropathy, PUD, sleep apnea presenting to the hospital for evaluation of right lower extremity pain secondary to a chronic wound.  Patient states she has had this wound on her right lower extremity since January and has had multiple surgeries done.  States this is causing her pain and her PCP has referred her to a pain clinic.  It has been difficult for her to transfer even from a chair due to this pain.  She is also having pain in her right hip for about a week now.  She did not fall.  Denies any fevers.  She has been having dysuria for the past few days.  No other complaints.  ED Course: Afebrile and hemodynamically stable.  No leukocytosis.  Potassium 3.4.  BUN 34, creatinine 1.9.  Baseline creatinine 1.4-1.5.  BNP normal.  UA with large amount of leukocytes, greater than 50 WBCs, and many bacteria on microscopic examination.  SARS-CoV-2 test pending.  Chest x-ray showing mild prominence of the bilateral perihilar interstitial markings, likely attenuated by low lung volumes.  No focal consolidation.  Patient received ceftriaxone.  **Interim History  Still complaining of some right leg pain.  PT recommending SNF and social worker consulted for assistance with placement.  Assessment & Plan:   Principal Problem:   Chronic venous hypertension (idiopathic) with ulcer of right lower extremity (CODE) (HCC) Active Problems:   Hypokalemia   AKI (acute kidney injury) ([place])   UTI (urinary  tract infection)   Right hip pain  Idiopathic chronic venous hypertension of the right lower extremity with ulceration -Status post debridement and skin grafting in April 2020.   -Followed by Dr. [name].   -Ulcer does not appear infected at present.  No fever or leukocytosis.   -Patient is having problems with mobility secondary to this wound and is interested in SNF placement. -Tramadol PRN, Tylenol PRN pain; resume her home oxycodone dose as well as gabapentin -Wound care consult -PT evaluation recommending skilled nursing facility -Social work consult for SNF placement  UTI -Afebrile no leukocytosis.  No signs of sepsis.   -Patient endorses dysuria.  -UA done and showed turbid appearance with small hemoglobin, large leukocytes, many bacteria, 6-10 RBCs per high-power field, greater than 50 WBCs with urine culture pending -Does have penicillin allergy listed.   -Received ceftriaxone in the ED and has tolerated it well.   -Continue IV ceftriaxone for now.  Right hip pain along with right leg and foot pain -Patient is complaining of pain in her right hip which is limiting her mobility. -Right hip x-ray Unrevealing and will obtain Right Knee and Right Foot X-Ray -Tramadol PRN, Tylenol PRN pain; Resumed Home Oxycodone and Gabapentin -Check vitamin B12, TSH as well and B1 level -We will need outpatient nerve conduction studies -If Patient continues to have leg pain may need further imaging with a CT scan versus MRI  Mild Hypokalemia -Potassium 3.4 on admission and improved to 3.7 -Continue to monitor replete as necessary -Repeat CMP in AM   AKI on CKD stage III, improving  -  BUN 34, creatinine 1.9.  Baseline creatinine 1.4-1.5.  Possibly prerenal due to dehydration. -BUN/Cr is now improved to 27/1.42 -IV fluid hydration reduced to 75 mL/hr -Continue to monitor renal function -Monitor urine output  Asthma and Mild Acute Respiratory Failure with Hypoxia -Has very mild mild  scattered end expiratory wheezing on exam.   -No increased work of breathing, tachypnea, or hypoxia. -Combivent every 6 hours -C/w Dulera and Montelukast -Albuterol MDI every 2 hours as needed -Repeat CXR in AM -Wean O2 as Tolerated   Chronic Diastolic CHF -Stable.   -Has significant bilateral lower extremity edema secondary to lymphedema.  -BNP normal but not normal 2/2 Obesity and Body Habitys.   -Chest x-ray without pulmonary congestion. -Strict I's/O's and Daily Weights -Repeat CXR in AM   Diet controlled type 2 diabetes -A1c 6.0 on 09/08/2018. -Sliding scale insulin sensitive and CBG checks -C/w 102-116  Hypertension -Currently normotensive.  Hydralazine PRN.  Normocytic Anemia -Patient's Hgb/Hct is 9.8/32.6 -Check Anemia Panel in the AM -Continue to Monitor for S/Sx of Bleeding; Currently no S/Sx of Bleeding  -Repeat CBC in AM   DVT prophylaxis: Enoxaparin 40 mg sq q24h Code Status: FULL CODE  Family Communication: No family present at bedside  Disposition Plan: SNF when medically stable and improved  Consultants:   None   Procedures: None   Antimicrobials:  Anti-infectives (From admission, onward)   Start     Dose/Rate Route Frequency Ordered Stop   11/10/18 1800  cefTRIAXone (ROCEPHIN) 1 g in sodium chloride 0.9 % 100 mL IVPB     1 g 200 mL/hr over 30 Minutes Intravenous Every 24 hours 11/09/18 1948     11/09/18 1915  cefTRIAXone (ROCEPHIN) 1 g in sodium chloride 0.9 % 100 mL IVPB     1 g 200 mL/hr over 30 Minutes Intravenous  Once 11/09/18 1909 11/09/18 1953     Subjective: Seen And examined at bedside and was still complaining of some right leg pain specifically in her right foot and states that it was numb.  No nausea or vomiting.  Denies any chest pain, lightheadedness or dizziness.  No other concerns or complaints at this time.  Objective: Vitals:   11/09/18 2115 11/09/18 2210 11/10/18 0525 11/10/18 0758  BP: 122/66  (!) 115/51   Pulse: 79  81    Resp: 20  20   Temp: 98.2 F (36.8 C)  98.9 F (37.2 C)   TempSrc: Oral  Oral   SpO2: 100% 98% 98% 95%  Weight:      Height:  5\' 4"  (1.626 m)      Intake/Output Summary (Last 24 hours) at 11/10/2018 1716 Last data filed at 11/10/2018 1051 Gross per 24 hour  Intake 2200.71 ml  Output 650 ml  Net 1550.71 ml   Filed Weights   11/09/18 1020  Weight: (!) 157.4 kg   Examination: Physical Exam:  Constitutional: WN/WD super morbidly obese African-American female.  Calm eating breakfast but slightly uncomfortable Eyes: Lids and conjunctivae normal, sclerae anicteric  ENMT: External Ears, Nose appear normal. Grossly normal hearing. Mucous membranes are moist. Neck: Appears normal, supple, no cervical masses, normal ROM, no appreciable thyromegaly; no JVD Respiratory: Diminished to auscultation bilaterally, no wheezing, rales, rhonchi or crackles. Normal respiratory effort and patient is not tachypenic. No accessory muscle use.  Cardiovascular: RRR, no murmurs / rubs / gallops. S1 and S2 auscultated. 1+ LE extremity edema.  Abdomen: Soft, non-tender, Distended due to body habitus. Bowel sounds positive x4.  GU: Deferred.  Musculoskeletal: No clubbing / cyanosis of digits/nails. No joint deformity upper and lower extremities. Skin: Has a Right Leg Ulcer with some swelling and erythema. No induration; Warm and dry.  Neurologic: CN 2-12 grossly intact with no focal deficits. Romberg sign and cerebellar reflexes not assessed.  Psychiatric: Normal judgment and insight. Alert and oriented x 3. Anxious mood and appropriate affect.   Data Reviewed: I have personally reviewed following labs and imaging studies  CBC: Recent Labs  Lab 11/09/18 1140 11/10/18 0821  WBC 6.7 5.1  NEUTROABS 4.9 2.9  HGB 11.1* 9.8*  HCT 36.7 32.6*  MCV 94.8 95.6  PLT 208 245   Basic Metabolic Panel: Recent Labs  Lab 11/09/18 1140 11/09/18 2136 11/10/18 0227 11/10/18 0821  NA 138  --  140  --   K 3.4*   --  3.7  --   CL 97*  --  101  --   CO2 29  --  26  --   GLUCOSE 134*  --  91  --   BUN 34*  --  27*  --   CREATININE 1.96*  --  1.42*  --   CALCIUM 9.1  --  8.3*  --   MG  --  2.3  --  2.3  PHOS  --   --   --  4.1   GFR: Estimated Creatinine Clearance: 57.3 mL/min (A) (by C-G formula based on SCr of 1.42 mg/dL (H)). Liver Function Tests: Recent Labs  Lab 11/10/18 0821  AST 31  ALT 17  ALKPHOS 72  BILITOT 0.5  PROT 5.5*  ALBUMIN 2.8*   No results for input(s): LIPASE, AMYLASE in the last 168 hours. No results for input(s): AMMONIA in the last 168 hours. Coagulation Profile: No results for input(s): INR, PROTIME in the last 168 hours. Cardiac Enzymes: No results for input(s): CKTOTAL, CKMB, CKMBINDEX, TROPONINI in the last 168 hours. BNP (last 3 results) No results for input(s): PROBNP in the last 8760 hours. HbA1C: No results for input(s): HGBA1C in the last 72 hours. CBG: Recent Labs  Lab 11/09/18 1027 11/09/18 2151 11/10/18 0730 11/10/18 1139 11/10/18 1638  GLUCAP 127* 106* 104* 116* 102*   Lipid Profile: No results for input(s): CHOL, HDL, LDLCALC, TRIG, CHOLHDL, LDLDIRECT in the last 72 hours. Thyroid Function Tests: No results for input(s): TSH, T4TOTAL, FREET4, T3FREE, THYROIDAB in the last 72 hours. Anemia Panel: No results for input(s): VITAMINB12, FOLATE, FERRITIN, TIBC, IRON, RETICCTPCT in the last 72 hours. Sepsis Labs: No results for input(s): PROCALCITON, LATICACIDVEN in the last 168 hours.  Recent Results (from the past 240 hour(s))  SARS CORONAVIRUS 2 (TAT 6-24 HRS) Nasopharyngeal Nasopharyngeal Swab     Status: None   Collection Time: 11/09/18  7:25 PM   Specimen: Nasopharyngeal Swab  Result Value Ref Range Status   SARS Coronavirus 2 NEGATIVE NEGATIVE Final    Comment: (NOTE) SARS-CoV-2 target nucleic acids are NOT DETECTED. The SARS-CoV-2 RNA is generally detectable in upper and lower respiratory specimens during the acute phase of  infection. Negative results do not preclude SARS-CoV-2 infection, do not rule out co-infections with other pathogens, and should not be used as the sole basis for treatment or other patient management decisions. Negative results must be combined with clinical observations, patient history, and epidemiological information. The expected result is Negative. Fact Sheet for Patients: SugarRoll.be Fact Sheet for Healthcare Providers: https://www.woods-mathews.com/ This test is not yet approved or cleared by the [place] and  has been authorized  for detection and/or diagnosis of SARS-CoV-2 by FDA under an Emergency Use Authorization (EUA). This EUA will remain  in effect (meaning this test can be used) for the duration of the COVID-19 declaration under Section 56 4(b)(1) of the Act, 21 U.S.C. section 360bbb-3(b)(1), unless the authorization is terminated or revoked sooner. Performed at [facility], [place] [address], [place], [place] [address]     Radiology Studies: Dg Chest Port 1 View  Result Date: 11/10/2018 CLINICAL DATA:  Shortness of breath and wheezing this morning. EXAM: PORTABLE CHEST 1 VIEW COMPARISON:  Chest x-ray 11/09/2018 FINDINGS: The heart is mildly enlarged but stable. There is tortuosity and calcification of the thoracic aorta. Mild central vascular prominence and vascular congestion but no overt pulmonary edema. Low lung volumes with vascular crowding and streaky basilar atelectasis. No definite infiltrates. Suspect small left effusion. IMPRESSION: Cardiac enlargement and central vascular congestion without overt pulmonary edema. Suspect small left pleural effusion. Electronically Signed   By: Marijo Sanes M.D.   On: 11/10/2018 09:20   Dg Chest Portable 1 View  Result Date: 11/09/2018 CLINICAL DATA:  Hypoxemia EXAM: PORTABLE CHEST 1 VIEW COMPARISON:  08/26/2018 FINDINGS: Cardiomediastinal contours appear stable. Calcific  aortic knob. Lung volumes are low. Prominence of the bilateral perihilar interstitial markings. No focal airspace consolidation, pleural effusion, or pneumothorax. IMPRESSION: Mild prominence of the bilateral perihilar interstitial markings, likely accentuated by low lung volumes. Otherwise, no focal consolidation. Consider repeat inspiratory PA and lateral radiographs of the chest if further evaluation is clinically warranted. Electronically Signed   By: Davina Poke M.D.   On: 11/09/2018 11:50   Dg Hip Unilat With Pelvis 2-3 Views Right  Result Date: 11/09/2018 CLINICAL DATA:  Fever and hip pain. EXAM: DG HIP (WITH OR WITHOUT PELVIS) 2-3V RIGHT COMPARISON:  None. FINDINGS: There is no evidence of hip fracture or dislocation. There is no evidence of arthropathy or other focal bone abnormality. IMPRESSION: Negative. Electronically Signed   By: Titus Dubin M.D.   On: 11/09/2018 20:53   Scheduled Meds: . Chlorhexidine Gluconate Cloth  6 each Topical Daily  . enoxaparin (LOVENOX) injection  40 mg Subcutaneous QHS  . famotidine  20 mg Oral BID  . folic acid  0.5 mg Oral QHS  . gabapentin  100 mg Oral TID  . insulin aspart  0-5 Units Subcutaneous QHS  . insulin aspart  0-9 Units Subcutaneous TID WC  . ipratropium-albuterol  3 mL Nebulization BID  . mometasone-formoterol  2 puff Inhalation BID  . montelukast  10 mg Oral QHS  . Oxcarbazepine  300 mg Oral BID  . simvastatin  40 mg Oral QHS   Continuous Infusions: . cefTRIAXone (ROCEPHIN)  IV      LOS: 0 days   Kerney Elbe, DO [facility] PAGER is on AMION  If 7PM-7AM, please contact night-coverage [email or web address] Password [email or web address] 11/10/2018, 5:16 PM

## 2018-11-11 ENCOUNTER — Inpatient Hospital Stay (HOSPITAL_COMMUNITY): Payer: Medicare Other

## 2018-11-11 DIAGNOSIS — R531 Weakness: Secondary | ICD-10-CM

## 2018-11-11 DIAGNOSIS — N390 Urinary tract infection, site not specified: Secondary | ICD-10-CM

## 2018-11-11 DIAGNOSIS — B9689 Other specified bacterial agents as the cause of diseases classified elsewhere: Secondary | ICD-10-CM

## 2018-11-11 LAB — RETICULOCYTES
Immature Retic Fract: 15.6 % (ref 2.3–15.9)
RBC.: 3.34 MIL/uL — ABNORMAL LOW (ref 3.87–5.11)
Retic Count, Absolute: 52.8 10*3/uL (ref 19.0–186.0)
Retic Ct Pct: 1.6 % (ref 0.4–3.1)

## 2018-11-11 LAB — COMPREHENSIVE METABOLIC PANEL
ALT: 16 U/L (ref 0–44)
AST: 27 U/L (ref 15–41)
Albumin: 2.5 g/dL — ABNORMAL LOW (ref 3.5–5.0)
Alkaline Phosphatase: 67 U/L (ref 38–126)
Anion gap: 9 (ref 5–15)
BUN: 24 mg/dL — ABNORMAL HIGH (ref 8–23)
CO2: 28 mmol/L (ref 22–32)
Calcium: 8.2 mg/dL — ABNORMAL LOW (ref 8.9–10.3)
Chloride: 103 mmol/L (ref 98–111)
Creatinine, Ser: 1.39 mg/dL — ABNORMAL HIGH (ref 0.44–1.00)
GFR calc Af Amer: 45 mL/min — ABNORMAL LOW (ref >60)
GFR calc non Af Amer: 39 mL/min — ABNORMAL LOW (ref >60)
Glucose, Bld: 93 mg/dL (ref 70–99)
Potassium: 3.6 mmol/L (ref 3.5–5.1)
Sodium: 140 mmol/L (ref 135–145)
Total Bilirubin: 0.5 mg/dL (ref 0.3–1.2)
Total Protein: 5.3 g/dL — ABNORMAL LOW (ref 6.5–8.1)

## 2018-11-11 LAB — CBC WITH DIFFERENTIAL/PLATELET
Abs Immature Granulocytes: 0.03 10*3/uL (ref 0.00–0.07)
Basophils Absolute: 0.1 10*3/uL (ref 0.0–0.1)
Basophils Relative: 1 %
Eosinophils Absolute: 0.3 10*3/uL (ref 0.0–0.5)
Eosinophils Relative: 5 %
HCT: 32.3 % — ABNORMAL LOW (ref 36.0–46.0)
Hemoglobin: 9.6 g/dL — ABNORMAL LOW (ref 12.0–15.0)
Immature Granulocytes: 1 %
Lymphocytes Relative: 30 %
Lymphs Abs: 1.7 10*3/uL (ref 0.7–4.0)
MCH: 28.7 pg (ref 26.0–34.0)
MCHC: 29.7 g/dL — ABNORMAL LOW (ref 30.0–36.0)
MCV: 96.7 fL (ref 80.0–100.0)
Monocytes Absolute: 0.6 10*3/uL (ref 0.1–1.0)
Monocytes Relative: 10 %
Neutro Abs: 3.1 10*3/uL (ref 1.7–7.7)
Neutrophils Relative %: 53 %
Platelets: 189 10*3/uL (ref 150–400)
RBC: 3.34 MIL/uL — ABNORMAL LOW (ref 3.87–5.11)
RDW: 14.8 % (ref 11.5–15.5)
WBC: 5.8 10*3/uL (ref 4.0–10.5)
nRBC: 0 % (ref 0.0–0.2)

## 2018-11-11 LAB — MAGNESIUM: Magnesium: 2.2 mg/dL (ref 1.7–2.4)

## 2018-11-11 LAB — GLUCOSE, CAPILLARY
Glucose-Capillary: 96 mg/dL (ref 70–99)
Glucose-Capillary: 144 mg/dL — ABNORMAL HIGH (ref 70–99)
Glucose-Capillary: 106 mg/dL — ABNORMAL HIGH (ref 70–99)
Glucose-Capillary: 97 mg/dL (ref 70–99)

## 2018-11-11 LAB — IRON AND TIBC
Iron: 21 ug/dL — ABNORMAL LOW (ref 28–170)
Saturation Ratios: 9 % — ABNORMAL LOW (ref 10.4–31.8)
TIBC: 223 ug/dL — ABNORMAL LOW (ref 250–450)
UIBC: 202 ug/dL

## 2018-11-11 LAB — TSH: TSH: 2.369 u[IU]/mL (ref 0.350–4.500)

## 2018-11-11 LAB — FERRITIN: Ferritin: 43 ng/mL (ref 11–307)

## 2018-11-11 LAB — PHOSPHORUS: Phosphorus: 4 mg/dL (ref 2.5–4.6)

## 2018-11-11 LAB — VITAMIN B12: Vitamin B-12: 2991 pg/mL — ABNORMAL HIGH (ref 180–914)

## 2018-11-11 LAB — URINE CULTURE: Culture: >=100000 — AB

## 2018-11-11 LAB — FOLATE: Folate: 16.3 ng/mL (ref >5.9)

## 2018-11-11 MED ORDER — FUROSEMIDE 40 MG PO TABS: 80.0000 mg | ORAL_TABLET | Freq: Two times a day (BID) | ORAL | Status: DC

## 2018-11-11 MED ORDER — FUROSEMIDE 40 MG PO TABS
120.0000 mg | ORAL_TABLET | Freq: Two times a day (BID) | ORAL | Status: DC
  Administered 2018-11-11 – 2018-11-16 (×11): 120 mg via ORAL
  Filled 2018-11-11 (×11): qty 3

## 2018-11-11 MED ORDER — ENOXAPARIN SODIUM 80 MG/0.8ML ~~LOC~~ SOLN
80.0000 mg | Freq: Every day | SUBCUTANEOUS | Status: DC
  Administered 2018-11-11 – 2018-11-15 (×5): 80 mg via SUBCUTANEOUS
  Filled 2018-11-11 (×6): qty 0.8

## 2018-11-11 MED ORDER — LIDOCAINE-PRILOCAINE 2.5-2.5 % EX CREA
TOPICAL_CREAM | Freq: Once | CUTANEOUS | Status: AC
  Administered 2018-11-11: 11:00:00 via TOPICAL
  Filled 2018-11-11: qty 5

## 2018-11-11 NOTE — Progress Notes (Signed)
PROGRESS NOTE    April [name]  HEN:[id number] DOB: May 17, 1950 DOA: 11/09/2018 PCP: [name], MD   Brief Narrative:  HPI per Dr. Shela Leff on 11/09/2018 April [name] is a 68 y.o. female with medical history significant of idiopathic chronic venous hypertension of the right lower extremity with ulceration status post debridement and skin grafting in April 2020, chronic lymphedema, anemia, asthma, CAD, CHF, CKD, diet-controlled type 2 diabetes, GERD, hypertension, hyperlipidemia, morbid obesity, neuropathy, PUD, sleep apnea presenting to the hospital for evaluation of right lower extremity pain secondary to a chronic wound.  Patient states she has had this wound on her right lower extremity since January and has had multiple surgeries done.  States this is causing her pain and her PCP has referred her to a pain clinic.  It has been difficult for her to transfer even from a chair due to this pain.  She is also having pain in her right hip for about a week now.  She did not fall.  Denies any fevers.  She has been having dysuria for the past few days.  No other complaints.  ED Course: Afebrile and hemodynamically stable.  No leukocytosis.  Potassium 3.4.  BUN 34, creatinine 1.9.  Baseline creatinine 1.4-1.5.  BNP normal.  UA with large amount of leukocytes, greater than 50 WBCs, and many bacteria on microscopic examination.  SARS-CoV-2 test pending.  Chest x-ray showing mild prominence of the bilateral perihilar interstitial markings, likely attenuated by low lung volumes.  No focal consolidation.  Patient received ceftriaxone.  **Interim History  Still complaining of some right leg pain she states that she is trying to exercise it.  PT recommending SNF and social worker consulted for assistance with placement.  Urine grew out Klebsiella pneumoniae which was only resistant to ampicillin and Macrobid.  We will continue IV ceftriaxone for now and treat for at least 5 days total and  transition to p.o. antibiotics at discharge.  Assessment & Plan:   Principal Problem:   Chronic venous hypertension (idiopathic) with ulcer of right lower extremity (CODE) (HCC) Active Problems:   Hypokalemia   AKI (acute kidney injury) ([place])   UTI (urinary tract infection)   Right hip pain  Idiopathic chronic venous hypertension of the right lower extremity with ulceration -Status post debridement and skin grafting in April 2020.   -Followed by Dr. [name].   -Ulcer does not appear infected at present.  No fever or leukocytosis.   -Patient is having problems with mobility secondary to this wound and is interested in SNF placement. -Tramadol PRN, Tylenol PRN pain; resume her home oxycodone dose as well as gabapentin patient still complaining of significant pain though nursing states that she appears fairly comfortable -Wound care consult -PT evaluation recommending skilled nursing facility -Social work consult for SNF placement  Klebsiella Pneumoniae UTI -Afebrile no leukocytosis.  No signs of sepsis.   -Patient endorses dysuria.  -UA done and showed turbid appearance with small hemoglobin, large leukocytes, many bacteria, 6-10 RBCs per high-power field, greater than 50 WBCs with urine culture growing greater than 100,000 colony-forming units of Klebsiella -Does have penicillin allergy listed.   -Received ceftriaxone in the ED and has tolerated it well.     -Will continue IV ceftriaxone for now changed to p.o. antibiotics at discharge   Right hip pain along with right leg and foot pain -Patient is complaining of pain in her right hip which is limiting her mobility. -Right hip x-ray Unrevealing and will obtain  Right Knee and Right Foot X-Ray -Right knee and right foot x-ray showed no acute osseous abnormality but the right knee did show advanced tricompartment arthritis with a small effusion -Tramadol PRN, Tylenol PRN pain; Resumed Home Oxycodone and Gabapentin -Check vitamin B12  and was 2991, TSH as well and B1 level -We will need outpatient nerve conduction studies -If Patient continues to have leg pain may need further imaging with a CT scan versus MRI but thinks her pain is getting a little bit better  Mild Hypokalemia -Potassium 3.4 on admission and improved to 3.7 -Continue to monitor replete as necessary -Repeat CMP in AM   AKI on CKD stage III, improving  -BUN 34, creatinine 1.9.  Baseline creatinine 1.4-1.5.  Possibly prerenal due to dehydration. -BUN/Cr is now improved to 24/1.39 -IV fluid hydration reduced to 75 mL/hr and now stopped; hold home Lasix dosing now -Continue to monitor renal function -Monitor urine output  Asthma and Mild Acute Respiratory Failure with Hypoxia -Has very mild mild scattered end expiratory wheezing on exam.   -No increased work of breathing, tachypnea remains on 2 L this morning and will have asked the nurse to wean -Combivent every 6 hours -C/w Dulera and Montelukast -Albuterol MDI every 2 hours as needed -Repeat CXR this AM showed "Low lung volumes without radiographic evidence of acute cardiopulmonary disease. Prominence of the central pulmonary arteries, concerning for pulmonary arterial hypertension." -Wean O2 as Tolerated  -We will need home Ambulatory screen prior to discharge if she is able to walk  Chronic Diastolic CHF -Stable.   -Has significant bilateral lower extremity edema secondary to lymphedema. -BNP normal but not accurate 2/2 Obesity and Body Habitus.   -Chest x-ray showed "Low lung volumes without radiographic evidence of acute cardiopulmonary disease. Prominence of the central pulmonary arteries, concerning for pulmonary arterial hypertension" -Resume Home Lasix Dosing  -Strict I's/O's and Daily Weights; She is +481.5 mL since admission -Repeat CXR in AM   Diet controlled type 2 diabetes -A1c 6.0 on 09/08/2018. -Sliding scale insulin sensitive and CBG checks -CBG's ranging from  89-144  Hypertension -Currently normotensive. C/w Hydralazine PRN. -Resume Home Lasix   Normocytic Anemia -Patient's Hgb/Hct is 9.8/32.6 -Checked Anemia Panel and iron level was 21, U IBC of 202, TIBC was 223, saturation ratios of 9%, ferritin of 43, folate of 16.3, vitamin B12 of 2991 -Continue to Monitor for S/Sx of Bleeding; Currently no S/Sx of Bleeding  -Repeat CBC in AM   DVT prophylaxis: Enoxaparin 40 mg sq q24h Code Status: FULL CODE  Family Communication: No family present at bedside  Disposition Plan: SNF when medically stable and improved  Consultants:   None   Procedures: None   Antimicrobials:  Anti-infectives (From admission, onward)   Start     Dose/Rate Route Frequency Ordered Stop   11/10/18 1800  cefTRIAXone (ROCEPHIN) 1 g in sodium chloride 0.9 % 100 mL IVPB     1 g 200 mL/hr over 30 Minutes Intravenous Every 24 hours 11/09/18 1948     11/09/18 1915  cefTRIAXone (ROCEPHIN) 1 g in sodium chloride 0.9 % 100 mL IVPB     1 g 200 mL/hr over 30 Minutes Intravenous  Once 11/09/18 1909 11/09/18 1953     Subjective: Seen And examined at bedside and happy that her numbers are improving.  Still complaining some leg pain but states she has been working try to exercise it.  No nausea or vomiting.  Still wearing oxygen and wearing 2 L this morning I  asked the nurse to wean it.  No other concerns or complaints at this time the patient still feels pretty weak.   Objective: Vitals:   11/10/18 2100 11/11/18 0529 11/11/18 0741 11/11/18 1259  BP: (!) 156/62 (!) 128/59  (!) 145/76  Pulse: 80 73  86  Resp: 18 14  15   Temp: 99.7 F (37.6 C) 98 F (36.7 C)  98.9 F (37.2 C)  TempSrc: Oral Oral  Oral  SpO2: 90% 100% 99% 98%  Weight:      Height:        Intake/Output Summary (Last 24 hours) at 11/11/2018 1757 Last data filed at 11/11/2018 1746 Gross per 24 hour  Intake 1730.8 ml  Output 2800 ml  Net -1069.2 ml   Filed Weights   11/09/18 1020  Weight: (!) 157.4  kg   Examination: Physical Exam:  Constitutional: WN/WD super morbidly obese AAF in NAD and appears calm and comfortable Eyes: Lids and conjunctivae normal, sclerae anicteric  ENMT: External Ears, Nose appear normal. Grossly normal hearing. Mucous membranes are moist.  Neck: Appears normal, supple, no cervical masses, normal ROM, no appreciable thyromegaly; no appreciable JVD but difficult to assess with body habitus Respiratory: Diminished to auscultation bilaterally, no wheezing, rales, rhonchi or crackles. Normal respiratory effort and patient is not tachypenic. No accessory muscle use. Wearing supplemental O2 via [place] Cardiovascular: RRR, no murmurs / rubs / gallops. S1 and S2 auscultated. Has LE Lymphedema Abdomen: Soft, non-tender, Distended due to body habitus. No masses palpated. No appreciable hepatosplenomegaly. Bowel sounds positive x4.  GU: Deferred. Musculoskeletal: No clubbing / cyanosis of digits/nails. No joint deformity upper and lower extremities. Skin: Has a Right Leg Ulcer with some swelling but does not appear to be infected. No induration; Warm and dry.  Neurologic: CN 2-12 grossly intact with no focal deficits. Romberg sign and cerebellar reflexes not assessed.  Psychiatric: Normal judgment and insight. Alert and oriented x 3. Normal mood and appropriate affect.   Data Reviewed: I have personally reviewed following labs and imaging studies  CBC: Recent Labs  Lab 11/09/18 1140 11/10/18 0821 11/11/18 0241  WBC 6.7 5.1 5.8  NEUTROABS 4.9 2.9 3.1  HGB 11.1* 9.8* 9.6*  HCT 36.7 32.6* 32.3*  MCV 94.8 95.6 96.7  PLT 208 179 967   Basic Metabolic Panel: Recent Labs  Lab 11/09/18 1140 11/09/18 2136 11/10/18 0227 11/10/18 0821 11/11/18 0241  NA 138  --  140  --  140  K 3.4*  --  3.7  --  3.6  CL 97*  --  101  --  103  CO2 29  --  26  --  28  GLUCOSE 134*  --  91  --  93  BUN 34*  --  27*  --  24*  CREATININE 1.96*  --  1.42*  --  1.39*  CALCIUM 9.1  --  8.3*   --  8.2*  MG  --  2.3  --  2.3 2.2  PHOS  --   --   --  4.1 4.0   GFR: Estimated Creatinine Clearance: 58.6 mL/min (A) (by C-G formula based on SCr of 1.39 mg/dL (H)). Liver Function Tests: Recent Labs  Lab 11/10/18 0821 11/11/18 0241  AST 31 27  ALT 17 16  ALKPHOS 72 67  BILITOT 0.5 0.5  PROT 5.5* 5.3*  ALBUMIN 2.8* 2.5*   No results for input(s): LIPASE, AMYLASE in the last 168 hours. No results for input(s): AMMONIA in the last 168  hours. Coagulation Profile: No results for input(s): INR, PROTIME in the last 168 hours. Cardiac Enzymes: No results for input(s): CKTOTAL, CKMB, CKMBINDEX, TROPONINI in the last 168 hours. BNP (last 3 results) No results for input(s): PROBNP in the last 8760 hours. HbA1C: No results for input(s): HGBA1C in the last 72 hours. CBG: Recent Labs  Lab 11/10/18 1638 11/10/18 2110 11/11/18 0746 11/11/18 1154 11/11/18 1651  GLUCAP 102* 89 96 144* 106*   Lipid Profile: No results for input(s): CHOL, HDL, LDLCALC, TRIG, CHOLHDL, LDLDIRECT in the last 72 hours. Thyroid Function Tests: No results for input(s): TSH, T4TOTAL, FREET4, T3FREE, THYROIDAB in the last 72 hours. Anemia Panel: Recent Labs    11/11/18 0241  VITAMINB12 2,991*  FOLATE 16.3  FERRITIN 43  TIBC 223*  IRON 21*  RETICCTPCT 1.6   Sepsis Labs: No results for input(s): PROCALCITON, LATICACIDVEN in the last 168 hours.  Recent Results (from the past 240 hour(s))  Culture, Urine     Status: Abnormal   Collection Time: 11/09/18  6:29 PM   Specimen: Urine, Random  Result Value Ref Range Status   Specimen Description   Final    URINE, RANDOM Performed at [facility] [address] [place], [place] [address]    Special Requests   Final    NONE Performed at [facility], [place] [address] [place], [place] [address]    Culture >=100,000 COLONIES/mL KLEBSIELLA PNEUMONIAE (A)  Final   Report Status 11/11/2018 FINAL  Final   Organism  ID, Bacteria KLEBSIELLA PNEUMONIAE (A)  Final      Susceptibility   Klebsiella pneumoniae - MIC*    AMPICILLIN RESISTANT Resistant     CEFAZOLIN <=4 SENSITIVE Sensitive     CEFTRIAXONE <=1 SENSITIVE Sensitive     CIPROFLOXACIN <=0.25 SENSITIVE Sensitive     GENTAMICIN <=1 SENSITIVE Sensitive     IMIPENEM <=0.25 SENSITIVE Sensitive     NITROFURANTOIN 128 RESISTANT Resistant     TRIMETH/SULFA <=20 SENSITIVE Sensitive     AMPICILLIN/SULBACTAM 4 SENSITIVE Sensitive     PIP/TAZO <=4 SENSITIVE Sensitive     Extended ESBL NEGATIVE Sensitive     * >=100,000 COLONIES/mL KLEBSIELLA PNEUMONIAE  SARS CORONAVIRUS 2 (TAT 6-24 HRS) Nasopharyngeal Nasopharyngeal Swab     Status: None   Collection Time: 11/09/18  7:25 PM   Specimen: Nasopharyngeal Swab  Result Value Ref Range Status   SARS Coronavirus 2 NEGATIVE NEGATIVE Final    Comment: (NOTE) SARS-CoV-2 target nucleic acids are NOT DETECTED. The SARS-CoV-2 RNA is generally detectable in upper and lower respiratory specimens during the acute phase of infection. Negative results do not preclude SARS-CoV-2 infection, do not rule out co-infections with other pathogens, and should not be used as the sole basis for treatment or other patient management decisions. Negative results must be combined with clinical observations, patient history, and epidemiological information. The expected result is Negative. Fact Sheet for Patients: SugarRoll.be Fact Sheet for Healthcare Providers: https://www.woods-mathews.com/ This test is not yet approved or cleared by the [place] FDA and  has been authorized for detection and/or diagnosis of SARS-CoV-2 by FDA under an Emergency Use Authorization (EUA). This EUA will remain  in effect (meaning this test can be used) for the duration of the COVID-19 declaration under Section 56 4(b)(1) of the Act, 21 U.S.C. section 360bbb-3(b)(1), unless the authorization is terminated  or revoked sooner. Performed at [facility], [place] [address], [place], [place] [address]     Radiology  Studies: Dg Knee 1-2 Views Right  Result Date: 11/10/2018 CLINICAL DATA:  Chronic knee pain EXAM: RIGHT KNEE - 1-2 VIEW COMPARISON:  09/07/2018, 02/15/2018 FINDINGS: Cortex irregularity at the right fibular head neck is chronic. Advanced tricompartment arthritis with severe joint space narrowing, bony spurring, and subarticular sclerosis. Suspect small knee effusion. IMPRESSION: 1. No acute osseous abnormality 2. Advanced tricompartment arthritis with suspected knee effusion Electronically Signed   By: Donavan Foil M.D.   On: 11/10/2018 20:08   Dg Chest Port 1 View  Result Date: 11/11/2018 CLINICAL DATA:  68 year old female with history of shortness of breath. EXAM: PORTABLE CHEST 1 VIEW COMPARISON:  Chest x-ray 11/10/2018. FINDINGS: Lung volumes are low. No consolidative airspace disease. No pleural effusions. No pneumothorax. No pulmonary nodule or mass noted. Dilatation of the central pulmonary arteries, concerning for pulmonary arterial hypertension. No evidence of pulmonary edema. Heart size is borderline enlarged. Upper mediastinal contours are within normal limits. Aortic atherosclerosis. IMPRESSION: 1. Low lung volumes without radiographic evidence of acute cardiopulmonary disease. 2. Prominence of the central pulmonary arteries, concerning for pulmonary arterial hypertension. Electronically Signed   By: Vinnie Langton M.D.   On: 11/11/2018 08:48   Dg Chest Port 1 View  Result Date: 11/10/2018 CLINICAL DATA:  Shortness of breath and wheezing this morning. EXAM: PORTABLE CHEST 1 VIEW COMPARISON:  Chest x-ray 11/09/2018 FINDINGS: The heart is mildly enlarged but stable. There is tortuosity and calcification of the thoracic aorta. Mild central vascular prominence and vascular congestion but no overt pulmonary edema. Low lung volumes with vascular crowding and streaky basilar  atelectasis. No definite infiltrates. Suspect small left effusion. IMPRESSION: Cardiac enlargement and central vascular congestion without overt pulmonary edema. Suspect small left pleural effusion. Electronically Signed   By: Marijo Sanes M.D.   On: 11/10/2018 09:20   Dg Foot 2 Views Right  Result Date: 11/10/2018 CLINICAL DATA:  Chronic pain EXAM: RIGHT FOOT - 2 VIEW COMPARISON:  None. FINDINGS: No acute displaced fracture or malalignment. Minimal degenerative change at the first MTP joint. Moderate plantar calcaneal spur and posterior enthesophyte IMPRESSION: No acute osseous abnormality Electronically Signed   By: Donavan Foil M.D.   On: 11/10/2018 20:09   Dg Hip Unilat With Pelvis 2-3 Views Right  Result Date: 11/09/2018 CLINICAL DATA:  Fever and hip pain. EXAM: DG HIP (WITH OR WITHOUT PELVIS) 2-3V RIGHT COMPARISON:  None. FINDINGS: There is no evidence of hip fracture or dislocation. There is no evidence of arthropathy or other focal bone abnormality. IMPRESSION: Negative. Electronically Signed   By: Titus Dubin M.D.   On: 11/09/2018 20:53   Scheduled Meds:  Chlorhexidine Gluconate Cloth  6 each Topical Daily   enoxaparin (LOVENOX) injection  80 mg Subcutaneous QHS   famotidine  20 mg Oral BID   folic acid  0.5 mg Oral QHS   furosemide  120 mg Oral BID   gabapentin  100 mg Oral TID   insulin aspart  0-5 Units Subcutaneous QHS   insulin aspart  0-9 Units Subcutaneous TID WC   ipratropium-albuterol  3 mL Nebulization BID   mometasone-formoterol  2 puff Inhalation BID   montelukast  10 mg Oral QHS   Oxcarbazepine  300 mg Oral BID   simvastatin  40 mg Oral QHS   Continuous Infusions:  cefTRIAXone (ROCEPHIN)  IV 1 g (11/11/18 1741)    LOS: 1 day   Kerney Elbe, DO [facility] PAGER is on AMION  If 7PM-7AM, please contact night-coverage [email or web address] Password  [email or web address] 11/11/2018, 5:57 PM

## 2018-11-12 ENCOUNTER — Inpatient Hospital Stay (HOSPITAL_COMMUNITY): Payer: Medicare Other

## 2018-11-12 DIAGNOSIS — M7989 Other specified soft tissue disorders: Secondary | ICD-10-CM

## 2018-11-12 DIAGNOSIS — L97919 Non-pressure chronic ulcer of unspecified part of right lower leg with unspecified severity: Secondary | ICD-10-CM

## 2018-11-12 DIAGNOSIS — R52 Pain, unspecified: Secondary | ICD-10-CM

## 2018-11-12 LAB — CBC WITH DIFFERENTIAL/PLATELET
Abs Immature Granulocytes: 0.01 10*3/uL (ref 0.00–0.07)
Basophils Absolute: 0.1 10*3/uL (ref 0.0–0.1)
Basophils Relative: 1 %
Eosinophils Absolute: 0.2 10*3/uL (ref 0.0–0.5)
Eosinophils Relative: 4 %
HCT: 32.2 % — ABNORMAL LOW (ref 36.0–46.0)
Hemoglobin: 9.7 g/dL — ABNORMAL LOW (ref 12.0–15.0)
Immature Granulocytes: 0 %
Lymphocytes Relative: 29 %
Lymphs Abs: 1.5 10*3/uL (ref 0.7–4.0)
MCH: 29 pg (ref 26.0–34.0)
MCHC: 30.1 g/dL (ref 30.0–36.0)
MCV: 96.1 fL (ref 80.0–100.0)
Monocytes Absolute: 0.5 10*3/uL (ref 0.1–1.0)
Monocytes Relative: 9 %
Neutro Abs: 3.1 10*3/uL (ref 1.7–7.7)
Neutrophils Relative %: 57 %
Platelets: 178 10*3/uL (ref 150–400)
RBC: 3.35 MIL/uL — ABNORMAL LOW (ref 3.87–5.11)
RDW: 14.9 % (ref 11.5–15.5)
WBC: 5.4 10*3/uL (ref 4.0–10.5)
nRBC: 0 % (ref 0.0–0.2)

## 2018-11-12 LAB — COMPREHENSIVE METABOLIC PANEL
ALT: 16 U/L (ref 0–44)
AST: 25 U/L (ref 15–41)
Albumin: 2.5 g/dL — ABNORMAL LOW (ref 3.5–5.0)
Alkaline Phosphatase: 66 U/L (ref 38–126)
Anion gap: 9 (ref 5–15)
BUN: 17 mg/dL (ref 8–23)
CO2: 31 mmol/L (ref 22–32)
Calcium: 8.1 mg/dL — ABNORMAL LOW (ref 8.9–10.3)
Chloride: 101 mmol/L (ref 98–111)
Creatinine, Ser: 1.13 mg/dL — ABNORMAL HIGH (ref 0.44–1.00)
GFR calc Af Amer: 58 mL/min — ABNORMAL LOW (ref >60)
GFR calc non Af Amer: 50 mL/min — ABNORMAL LOW (ref >60)
Glucose, Bld: 108 mg/dL — ABNORMAL HIGH (ref 70–99)
Potassium: 3.5 mmol/L (ref 3.5–5.1)
Sodium: 141 mmol/L (ref 135–145)
Total Bilirubin: 0.6 mg/dL (ref 0.3–1.2)
Total Protein: 5.3 g/dL — ABNORMAL LOW (ref 6.5–8.1)

## 2018-11-12 LAB — GLUCOSE, CAPILLARY
Glucose-Capillary: 106 mg/dL — ABNORMAL HIGH (ref 70–99)
Glucose-Capillary: 117 mg/dL — ABNORMAL HIGH (ref 70–99)
Glucose-Capillary: 112 mg/dL — ABNORMAL HIGH (ref 70–99)
Glucose-Capillary: 120 mg/dL — ABNORMAL HIGH (ref 70–99)

## 2018-11-12 LAB — MAGNESIUM: Magnesium: 2.1 mg/dL (ref 1.7–2.4)

## 2018-11-12 LAB — PHOSPHORUS: Phosphorus: 3.5 mg/dL (ref 2.5–4.6)

## 2018-11-12 MED ORDER — DICLOFENAC SODIUM 1 % TD GEL
2.0000 g | Freq: Four times a day (QID) | TRANSDERMAL | Status: DC
  Administered 2018-11-12 – 2018-11-16 (×13): 2 g via TOPICAL
  Filled 2018-11-12: qty 100

## 2018-11-12 MED ORDER — FUROSEMIDE 10 MG/ML IJ SOLN
20.0000 mg | Freq: Once | INTRAMUSCULAR | Status: AC
  Administered 2018-11-12: 10:00:00 20 mg via INTRAVENOUS
  Filled 2018-11-12: qty 2

## 2018-11-12 MED ORDER — LIDOCAINE 5 % EX PTCH
1.0000 | MEDICATED_PATCH | CUTANEOUS | Status: DC
  Administered 2018-11-12 – 2018-11-16 (×4): 1 via TRANSDERMAL
  Filled 2018-11-12 (×5): qty 1

## 2018-11-12 NOTE — Plan of Care (Signed)
  Problem: Clinical Measurements: Goal: Cardiovascular complication will be avoided Outcome: Progressing   Problem: Coping: Goal: Level of anxiety will decrease Outcome: Progressing   Problem: Pain Managment: Goal: General experience of comfort will improve Outcome: Progressing   Problem: Safety: Goal: Ability to remain free from injury will improve Outcome: Progressing

## 2018-11-12 NOTE — Progress Notes (Signed)
Md notified of pt temp of 100.1. pt reports feeling fatigued and tired overall. rn will continue to monitor.

## 2018-11-12 NOTE — Progress Notes (Signed)
PROGRESS NOTE    April [name]  XLK:[id number] DOB: 06-16-1950 DOA: 11/09/2018 PCP: [name], MD   Brief Narrative:  HPI per Dr. Shela Leff on 11/09/2018 April [name] is a 68 y.o. female with medical history significant of idiopathic chronic venous hypertension of the right lower extremity with ulceration status post debridement and skin grafting in April 2020, chronic lymphedema, anemia, asthma, CAD, CHF, CKD, diet-controlled type 2 diabetes, GERD, hypertension, hyperlipidemia, morbid obesity, neuropathy, PUD, sleep apnea presenting to the hospital for evaluation of right lower extremity pain secondary to a chronic wound.  Patient states she has had this wound on her right lower extremity since January and has had multiple surgeries done.  States this is causing her pain and her PCP has referred her to a pain clinic.  It has been difficult for her to transfer even from a chair due to this pain.  She is also having pain in her right hip for about a week now.  She did not fall.  Denies any fevers.  She has been having dysuria for the past few days.  No other complaints.  ED Course: Afebrile and hemodynamically stable.  No leukocytosis.  Potassium 3.4.  BUN 34, creatinine 1.9.  Baseline creatinine 1.4-1.5.  BNP normal.  UA with large amount of leukocytes, greater than 50 WBCs, and many bacteria on microscopic examination.  SARS-CoV-2 test pending.  Chest x-ray showing mild prominence of the bilateral perihilar interstitial markings, likely attenuated by low lung volumes.  No focal consolidation.  Patient received ceftriaxone.  **Interim History  Still complaining of some right leg pain she states that she is trying to exercise it and today she complained of "cold toes."  PT recommending SNF and social worker consulted for assistance with placement.  Urine grew out Klebsiella pneumoniae which was only resistant to ampicillin and Macrobid.  We will continue IV ceftriaxone for now and  treat for at least 5 days total and transition to p.o. antibiotics at discharge.  Because of her continued lower extremity complaints we will obtain a vascular ABI as well as a lower extremity duplex to rule out DVT.  Assessment & Plan:   Principal Problem:   Chronic venous hypertension (idiopathic) with ulcer of right lower extremity (CODE) (HCC) Active Problems:   Hypokalemia   AKI (acute kidney injury) ([place])   UTI (urinary tract infection)   Right hip pain  Idiopathic chronic venous hypertension of the right lower extremity with ulceration -Status post debridement and skin grafting in April 2020.   -Followed by Dr. [name].   -Ulcer does not appear infected at present.  No fever or leukocytosis.   -Patient is having problems with mobility secondary to this wound and is interested in SNF placement. -Tramadol PRN, Tylenol PRN pain; resume her home oxycodone dose as well as gabapentin patient still complaining of significant pain though nursing states that she appears fairly comfortable -Wound care consult -We will check a vascular ABI as well as a lower extremity venous duplex given her complaints of numbness and cold toes however she has good palpable pulses on examination -PT evaluation recommending skilled nursing facility -Social work consult for SNF placement  Klebsiella Pneumoniae UTI -Afebrile no leukocytosis.  No signs of sepsis.  Did have a slight temperature of 100.1  -Patient endorses dysuria.  -UA done and showed turbid appearance with small hemoglobin, large leukocytes, many bacteria, 6-10 RBCs per high-power field, greater than 50 WBCs with urine culture growing greater than 100,000 colony-forming  units of Klebsiella -Does have penicillin allergy listed.   -Received ceftriaxone in the ED and has tolerated it well.     -Will continue IV ceftriaxone for now changed to p.o. antibiotics at discharge  Right hip pain along with right leg and foot pain -Patient is complaining of  pain in her right hip which is limiting her mobility. -Right hip x-ray Unrevealing and will obtain Right Knee and Right Foot X-Ray -Right knee and right foot x-ray showed no acute osseous abnormality but the right knee did show advanced tricompartment arthritis with a small effusion -Tramadol PRN, Tylenol PRN pain; Resumed Home Oxycodone and Gabapentin -Check vitamin B12 and was 2991, TSH as well and B1 level -We will need outpatient nerve conduction studies -If Patient continues to have leg pain may need further imaging with a CT scan versus MRI but thinks her pain is getting a little bit better -Vascular ABI and LE Venous Duplex to be done -LE Duplex showed no DVT -Will add Lidocaine Patch and Diclofenac Gel  Mild Hypokalemia -Potassium 3.4 on admission and improved to 3.5 -Continue to monitor replete as necessary -Repeat CMP in AM   AKI on CKD stage III, improving  -BUN 34, creatinine 1.9.  Baseline creatinine 1.4-1.5.  Possibly prerenal due to dehydration. -BUN/Cr is now improved to 17/1.13 -IV fluid hydration reduced to 75 mL/hr and now stopped; hold home Lasix dosing now and given an extra IV 20 mg Dose this AM -Continue to monitor renal function -Monitor urine output  Asthma and Mild Acute Respiratory Failure with Hypoxia -Has very mild mild scattered end expiratory wheezing on exam but it is improving .   -No increased work of breathing, tachypnea remains on 1-2L this morning and will have asked the nurse to wean -Combivent every 6 hours -C/w Dulera and Montelukast -Albuterol MDI every 2 hours as needed -Repeat CXR yesterday AM showed "Low lung volumes without radiographic evidence of acute cardiopulmonary disease. Prominence of the central pulmonary arteries, concerning for pulmonary arterial hypertension." -CXR this AM showed -Wean O2 as Tolerated  -We will need home Ambulatory screen prior to discharge if she is able to walk  Chronic Diastolic CHF -Stable.   -Has  significant bilateral lower extremity edema secondary to lymphedema. -BNP normal but not accurate 2/2 Obesity and Body Habitus.   -Chest x-ray this AM showed "No interval change. Central venous congestion and mild interstitial edema. Low lung volumes" -Resume Home Lasix Dosing and gave an Extra IV Dose 20 mg this AM  -Strict I's/O's and Daily Weights; She is -2,258 mL since admission -Repeat CXR in AM   Diet controlled type 2 diabetes -A1c 6.0 on 09/08/2018. -Sliding scale insulin sensitive and CBG checks -CBG's ranging from 97-117  Hypertension -Currently normotensive. C/w Hydralazine PRN. -Resume Home Lasix   Normocytic Anemia -Patient's Hgb/Hct is 9.7/32.2 -Checked Anemia Panel and iron level was 21, U IBC of 202, TIBC was 223, saturation ratios of 9%, ferritin of 43, folate of 16.3, vitamin B12 of 2991 -Continue to Monitor for S/Sx of Bleeding; Currently no S/Sx of Bleeding  -Repeat CBC in AM   DVT prophylaxis: Enoxaparin 40 mg sq q24h Code Status: FULL CODE  Family Communication: No family present at bedside  Disposition Plan: SNF when medically stable and improved  Consultants:   None   Procedures: None   Antimicrobials:  Anti-infectives (From admission, onward)   Start     Dose/Rate Route Frequency Ordered Stop   11/10/18 1800  cefTRIAXone (ROCEPHIN) 1 g  in sodium chloride 0.9 % 100 mL IVPB     1 g 200 mL/hr over 30 Minutes Intravenous Every 24 hours 11/09/18 1948     11/09/18 1915  cefTRIAXone (ROCEPHIN) 1 g in sodium chloride 0.9 % 100 mL IVPB     1 g 200 mL/hr over 30 Minutes Intravenous  Once 11/09/18 1909 11/09/18 1953     Subjective: Seen And examined at bedside complaining of some right hip pain and complaining of her "toes being cold".  No nausea or vomiting.  No chest pain, lightheadedness or dizziness.  No other concerns reported at this time but still having some right sided back pain and states that whenever she lays flat it stretches her back and  makes it hurt more.  Objective: Vitals:   11/11/18 2146 11/12/18 0549 11/12/18 1356 11/12/18 1500  BP: (!) 152/72 128/74 123/78   Pulse: 85 79 82   Resp: 16 16 16 18   Temp: 99.4 F (37.4 C) 99.2 F (37.3 C) 100.1 F (37.8 C) 99.2 F (37.3 C)  TempSrc: Oral Oral Oral Oral  SpO2: 100% 100% 96% 96%  Weight:      Height:        Intake/Output Summary (Last 24 hours) at 11/12/2018 1858 Last data filed at 11/12/2018 1837 Gross per 24 hour  Intake 1860 ml  Output 4600 ml  Net -2740 ml   Filed Weights   11/09/18 1020  Weight: (!) 157.4 kg   Examination: Physical Exam:  Constitutional: WN/WD super morbidly obese, NAD and appears calm and comfortable Eyes: Lids and conjunctivae normal, sclerae anicteric  ENMT: External Ears, Nose appear normal. Grossly normal hearing. Mucous membranes are moist Neck: Appears normal, supple, no cervical masses, normal ROM, no appreciable thyromegaly; no JVD Respiratory: Diminished to auscultation bilaterally, no wheezing, rales, rhonchi or crackles. Normal respiratory effort and patient is not tachypenic. No accessory muscle use.  Cardiovascular: RRR, no murmurs / rubs / gallops. S1 and S2 auscultated. Has LE Lymphedema Abdomen: Soft, non-tender, Distended due to body habitus. Bowel sounds positive x4.  GU: Deferred. Musculoskeletal: No clubbing / cyanosis of digits/nails. No joint deformity upper and lower extremities.  Skin: Has a Right Leg Ulcer. No induration; Warm and dry.  Neurologic: CN 2-12 grossly intact with no focal deficits. Romberg sign and cerebellar reflexes not assessed.  Psychiatric: Normal judgment and insight. Alert and oriented x 3. Anxious mood and appropriate affect.   Data Reviewed: I have personally reviewed following labs and imaging studies  CBC: Recent Labs  Lab 11/09/18 1140 11/10/18 0821 11/11/18 0241 11/12/18 0332  WBC 6.7 5.1 5.8 5.4  NEUTROABS 4.9 2.9 3.1 3.1  HGB 11.1* 9.8* 9.6* 9.7*  HCT 36.7 32.6* 32.3*  32.2*  MCV 94.8 95.6 96.7 96.1  PLT 208 179 189 354   Basic Metabolic Panel: Recent Labs  Lab 11/09/18 1140 11/09/18 2136 11/10/18 0227 11/10/18 0821 11/11/18 0241 11/12/18 0332  NA 138  --  140  --  140 141  K 3.4*  --  3.7  --  3.6 3.5  CL 97*  --  101  --  103 101  CO2 29  --  26  --  28 31  GLUCOSE 134*  --  91  --  93 108*  BUN 34*  --  27*  --  24* 17  CREATININE 1.96*  --  1.42*  --  1.39* 1.13*  CALCIUM 9.1  --  8.3*  --  8.2* 8.1*  MG  --  2.3  --  2.3 2.2 2.1  PHOS  --   --   --  4.1 4.0 3.5   GFR: Estimated Creatinine Clearance: 72.1 mL/min (A) (by C-G formula based on SCr of 1.13 mg/dL (H)). Liver Function Tests: Recent Labs  Lab 11/10/18 0821 11/11/18 0241 11/12/18 0332  AST 31 27 25   ALT 17 16 16   ALKPHOS 72 67 66  BILITOT 0.5 0.5 0.6  PROT 5.5* 5.3* 5.3*  ALBUMIN 2.8* 2.5* 2.5*   No results for input(s): LIPASE, AMYLASE in the last 168 hours. No results for input(s): AMMONIA in the last 168 hours. Coagulation Profile: No results for input(s): INR, PROTIME in the last 168 hours. Cardiac Enzymes: No results for input(s): CKTOTAL, CKMB, CKMBINDEX, TROPONINI in the last 168 hours. BNP (last 3 results) No results for input(s): PROBNP in the last 8760 hours. HbA1C: No results for input(s): HGBA1C in the last 72 hours. CBG: Recent Labs  Lab 11/11/18 1651 11/11/18 2149 11/12/18 0808 11/12/18 1207 11/12/18 1625  GLUCAP 106* 97 106* 117* 112*   Lipid Profile: No results for input(s): CHOL, HDL, LDLCALC, TRIG, CHOLHDL, LDLDIRECT in the last 72 hours. Thyroid Function Tests: Recent Labs    11/11/18 1852  TSH 2.369   Anemia Panel: Recent Labs    11/11/18 0241  VITAMINB12 2,991*  FOLATE 16.3  FERRITIN 43  TIBC 223*  IRON 21*  RETICCTPCT 1.6   Sepsis Labs: No results for input(s): PROCALCITON, LATICACIDVEN in the last 168 hours.  Recent Results (from the past 240 hour(s))  Culture, Urine     Status: Abnormal   Collection Time:  11/09/18  6:29 PM   Specimen: Urine, Random  Result Value Ref Range Status   Specimen Description   Final    URINE, RANDOM Performed at [facility] [address] [place], [place] [address]    Special Requests   Final    NONE Performed at [facility], [place] [address], [place], [place] [address]    Culture >=100,000 COLONIES/mL KLEBSIELLA PNEUMONIAE (A)  Final   Report Status 11/11/2018 FINAL  Final   Organism ID, Bacteria KLEBSIELLA PNEUMONIAE (A)  Final      Susceptibility   Klebsiella pneumoniae - MIC*    AMPICILLIN RESISTANT Resistant     CEFAZOLIN <=4 SENSITIVE Sensitive     CEFTRIAXONE <=1 SENSITIVE Sensitive     CIPROFLOXACIN <=0.25 SENSITIVE Sensitive     GENTAMICIN <=1 SENSITIVE Sensitive     IMIPENEM <=0.25 SENSITIVE Sensitive     NITROFURANTOIN 128 RESISTANT Resistant     TRIMETH/SULFA <=20 SENSITIVE Sensitive     AMPICILLIN/SULBACTAM 4 SENSITIVE Sensitive     PIP/TAZO <=4 SENSITIVE Sensitive     Extended ESBL NEGATIVE Sensitive     * >=100,000 COLONIES/mL KLEBSIELLA PNEUMONIAE  SARS CORONAVIRUS 2 (TAT 6-24 HRS) Nasopharyngeal Nasopharyngeal Swab     Status: None   Collection Time: 11/09/18  7:25 PM   Specimen: Nasopharyngeal Swab  Result Value Ref Range Status   SARS Coronavirus 2 NEGATIVE NEGATIVE Final    Comment: (NOTE) SARS-CoV-2 target nucleic acids are NOT DETECTED. The SARS-CoV-2 RNA is generally detectable in upper and lower respiratory specimens during the acute phase of infection. Negative results do not preclude SARS-CoV-2 infection, do not rule out co-infections with other pathogens, and should not be used as the sole basis for treatment or other patient management decisions. Negative results must be combined with clinical observations, patient history, and epidemiological information. The expected result  is Negative. Fact Sheet for Patients: SugarRoll.be Fact Sheet for Healthcare  Providers: https://www.woods-mathews.com/ This test is not yet approved or cleared by the [place] FDA and  has been authorized for detection and/or diagnosis of SARS-CoV-2 by FDA under an Emergency Use Authorization (EUA). This EUA will remain  in effect (meaning this test can be used) for the duration of the COVID-19 declaration under Section 56 4(b)(1) of the Act, 21 U.S.C. section 360bbb-3(b)(1), unless the authorization is terminated or revoked sooner. Performed at [facility], [place] [address], [place], [place] [address]     Radiology Studies: Dg Chest Port 1 View  Result Date: 11/12/2018 CLINICAL DATA:  Pre order- SOB EXAM: PORTABLE CHEST 1 VIEW COMPARISON:  11/11/2018 FINDINGS: Normal cardiac silhouette. Low lung volumes. Central venous congestion similar prior. No overt pulmonary edema. Mild linear interstitial markings cyst with mild interstitial edema. IMPRESSION: 1. No interval change. 2. Central venous congestion and mild interstitial edema. 3. Low lung volumes. Electronically Signed   By: Suzy Bouchard M.D.   On: 11/12/2018 05:55   Dg Chest Port 1 View  Result Date: 11/11/2018 CLINICAL DATA:  68 year old female with history of shortness of breath. EXAM: PORTABLE CHEST 1 VIEW COMPARISON:  Chest x-ray 11/10/2018. FINDINGS: Lung volumes are low. No consolidative airspace disease. No pleural effusions. No pneumothorax. No pulmonary nodule or mass noted. Dilatation of the central pulmonary arteries, concerning for pulmonary arterial hypertension. No evidence of pulmonary edema. Heart size is borderline enlarged. Upper mediastinal contours are within normal limits. Aortic atherosclerosis. IMPRESSION: 1. Low lung volumes without radiographic evidence of acute cardiopulmonary disease. 2. Prominence of the central pulmonary arteries, concerning for pulmonary arterial hypertension. Electronically Signed   By: Vinnie Langton M.D.   On: 11/11/2018 08:48   Vas [place]  Lower Extremity Venous (dvt)  Result Date: 11/12/2018  Lower Venous Study Indications: Swelling, and Pain.  Limitations: Body habitus and poor ultrasound/tissue interface. Comparison Study: previous study done 08/15/18 Performing Technologist: Abram Sander RVS  Examination Guidelines: A complete evaluation includes B-mode imaging, spectral Doppler, color Doppler, and power Doppler as needed of all accessible portions of each vessel. Bilateral testing is considered an integral part of a complete examination. Limited examinations for reoccurring indications may be performed as noted.  +---------+---------------+---------+-----------+----------+--------------+  RIGHT     Compressibility Phasicity Spontaneity Properties Thrombus Aging  +---------+---------------+---------+-----------+----------+--------------+  CFV       Full            Yes       Yes                                    +---------+---------------+---------+-----------+----------+--------------+  SFJ       Full                                                             +---------+---------------+---------+-----------+----------+--------------+  FV Prox   Full                                                             +---------+---------------+---------+-----------+----------+--------------+  FV Mid    Full                                                             +---------+---------------+---------+-----------+----------+--------------+  FV Distal                                                  Not visualized  +---------+---------------+---------+-----------+----------+--------------+  PFV       Full                                                             +---------+---------------+---------+-----------+----------+--------------+  POP       Full            Yes       Yes                                    +---------+---------------+---------+-----------+----------+--------------+  PTV                                                        Not  visualized  +---------+---------------+---------+-----------+----------+--------------+  PERO                                                       Not visualized  +---------+---------------+---------+-----------+----------+--------------+   +---------+---------------+---------+-----------+----------+--------------+  LEFT      Compressibility Phasicity Spontaneity Properties Thrombus Aging  +---------+---------------+---------+-----------+----------+--------------+  CFV       Full            Yes       Yes                                    +---------+---------------+---------+-----------+----------+--------------+  SFJ       Full                                                             +---------+---------------+---------+-----------+----------+--------------+  FV Prox   Full                                                             +---------+---------------+---------+-----------+----------+--------------+  FV  Mid    Full                                                             +---------+---------------+---------+-----------+----------+--------------+  FV Distal Full                                                             +---------+---------------+---------+-----------+----------+--------------+  PFV       Full                                                             +---------+---------------+---------+-----------+----------+--------------+  POP       Full            Yes       Yes                                    +---------+---------------+---------+-----------+----------+--------------+  PTV                                                        Not visualized  +---------+---------------+---------+-----------+----------+--------------+  PERO                                                       Not visualized  +---------+---------------+---------+-----------+----------+--------------+     Summary: Right: There is no evidence of deep vein thrombosis in the lower extremity. However, portions of  this examination were limited- see technologist comments above. No cystic structure found in the popliteal fossa. Left: There is no evidence of deep vein thrombosis in the lower extremity. However, portions of this examination were limited- see technologist comments above. No cystic structure found in the popliteal fossa.  *See table(s) above for measurements and observations.    Preliminary    Scheduled Meds:  Chlorhexidine Gluconate Cloth  6 each Topical Daily   diclofenac sodium  2 g Topical QID   enoxaparin (LOVENOX) injection  80 mg Subcutaneous QHS   famotidine  20 mg Oral BID   folic acid  0.5 mg Oral QHS   furosemide  120 mg Oral BID   gabapentin  100 mg Oral TID   insulin aspart  0-5 Units Subcutaneous QHS   insulin aspart  0-9 Units Subcutaneous TID WC   ipratropium-albuterol  3 mL Nebulization BID   lidocaine  1 patch Transdermal Q24H   mometasone-formoterol  2 puff Inhalation BID   montelukast  10 mg Oral QHS   Oxcarbazepine  300 mg Oral BID   simvastatin  40 mg Oral  QHS   Continuous Infusions:  cefTRIAXone (ROCEPHIN)  IV Stopped (11/12/18 1800)    LOS: 2 days   Kerney Elbe, DO [facility] PAGER is on [place]  If 7PM-7AM, please contact night-coverage [email or web address] Password [facility] 11/12/2018, 6:58 PM

## 2018-11-12 NOTE — Progress Notes (Signed)
Lower extremity venous has been completed.   Preliminary results in CV Proc.   April [name] 11/12/2018 11:26 AM

## 2018-11-13 ENCOUNTER — Inpatient Hospital Stay (HOSPITAL_COMMUNITY): Payer: Medicare Other

## 2018-11-13 LAB — COMPREHENSIVE METABOLIC PANEL
ALT: 14 U/L (ref 0–44)
AST: 23 U/L (ref 15–41)
Albumin: 2.7 g/dL — ABNORMAL LOW (ref 3.5–5.0)
Alkaline Phosphatase: 70 U/L (ref 38–126)
Anion gap: 12 (ref 5–15)
BUN: 15 mg/dL (ref 8–23)
CO2: 30 mmol/L (ref 22–32)
Calcium: 8.2 mg/dL — ABNORMAL LOW (ref 8.9–10.3)
Chloride: 97 mmol/L — ABNORMAL LOW (ref 98–111)
Creatinine, Ser: 1.16 mg/dL — ABNORMAL HIGH (ref 0.44–1.00)
GFR calc Af Amer: 56 mL/min — ABNORMAL LOW (ref >60)
GFR calc non Af Amer: 48 mL/min — ABNORMAL LOW (ref >60)
Glucose, Bld: 133 mg/dL — ABNORMAL HIGH (ref 70–99)
Potassium: 3.8 mmol/L (ref 3.5–5.1)
Sodium: 139 mmol/L (ref 135–145)
Total Bilirubin: 1 mg/dL (ref 0.3–1.2)
Total Protein: 5.6 g/dL — ABNORMAL LOW (ref 6.5–8.1)

## 2018-11-13 LAB — MAGNESIUM: Magnesium: 2.2 mg/dL (ref 1.7–2.4)

## 2018-11-13 LAB — CBC WITH DIFFERENTIAL/PLATELET
Abs Immature Granulocytes: 0.04 10*3/uL (ref 0.00–0.07)
Basophils Absolute: 0.1 10*3/uL (ref 0.0–0.1)
Basophils Relative: 2 %
Eosinophils Absolute: 0.3 10*3/uL (ref 0.0–0.5)
Eosinophils Relative: 5 %
HCT: 36.7 % (ref 36.0–46.0)
Hemoglobin: 10.9 g/dL — ABNORMAL LOW (ref 12.0–15.0)
Immature Granulocytes: 1 %
Lymphocytes Relative: 29 %
Lymphs Abs: 1.6 10*3/uL (ref 0.7–4.0)
MCH: 28.6 pg (ref 26.0–34.0)
MCHC: 29.7 g/dL — ABNORMAL LOW (ref 30.0–36.0)
MCV: 96.3 fL (ref 80.0–100.0)
Monocytes Absolute: 0.5 10*3/uL (ref 0.1–1.0)
Monocytes Relative: 10 %
Neutro Abs: 2.8 10*3/uL (ref 1.7–7.7)
Neutrophils Relative %: 53 %
Platelets: 197 10*3/uL (ref 150–400)
RBC: 3.81 MIL/uL — ABNORMAL LOW (ref 3.87–5.11)
RDW: 14.9 % (ref 11.5–15.5)
WBC: 5.3 10*3/uL (ref 4.0–10.5)
nRBC: 0 % (ref 0.0–0.2)

## 2018-11-13 LAB — GLUCOSE, CAPILLARY
Glucose-Capillary: 93 mg/dL (ref 70–99)
Glucose-Capillary: 165 mg/dL — ABNORMAL HIGH (ref 70–99)
Glucose-Capillary: 99 mg/dL (ref 70–99)
Glucose-Capillary: 86 mg/dL (ref 70–99)

## 2018-11-13 LAB — PHOSPHORUS: Phosphorus: 3.4 mg/dL (ref 2.5–4.6)

## 2018-11-13 MED ORDER — FERROUS SULFATE 325 (65 FE) MG PO TABS
325.0000 mg | ORAL_TABLET | Freq: Every day | ORAL | Status: DC
  Administered 2018-11-13 – 2018-11-16 (×4): 325 mg via ORAL
  Filled 2018-11-13 (×4): qty 1

## 2018-11-13 MED ORDER — OXYCODONE-ACETAMINOPHEN 5-325 MG PO TABS
2.0000 | ORAL_TABLET | Freq: Once | ORAL | Status: AC
  Administered 2018-11-13: 23:00:00 2 via ORAL
  Filled 2018-11-13: qty 2

## 2018-11-13 NOTE — Progress Notes (Signed)
PROGRESS NOTE    April [name]  KDX:[id number] DOB: 1950-08-29 DOA: 11/09/2018 PCP: [name], MD   Brief Narrative:  HPI per Dr. Shela Leff on 11/09/2018 April [name] is a 68 y.o. female with medical history significant of idiopathic chronic venous hypertension of the right lower extremity with ulceration status post debridement and skin grafting in April 2020, chronic lymphedema, anemia, asthma, CAD, CHF, CKD, diet-controlled type 2 diabetes, GERD, hypertension, hyperlipidemia, morbid obesity, neuropathy, PUD, sleep apnea presenting to the hospital for evaluation of right lower extremity pain secondary to a chronic wound.  Patient states she has had this wound on her right lower extremity since January and has had multiple surgeries done.  States this is causing her pain and her PCP has referred her to a pain clinic.  It has been difficult for her to transfer even from a chair due to this pain.  She is also having pain in her right hip for about a week now.  She did not fall.  Denies any fevers.  She has been having dysuria for the past few days.  No other complaints.  ED Course: Afebrile and hemodynamically stable.  No leukocytosis.  Potassium 3.4.  BUN 34, creatinine 1.9.  Baseline creatinine 1.4-1.5.  BNP normal.  UA with large amount of leukocytes, greater than 50 WBCs, and many bacteria on microscopic examination.  SARS-CoV-2 test pending.  Chest x-ray showing mild prominence of the bilateral perihilar interstitial markings, likely attenuated by low lung volumes.  No focal consolidation.  Patient received ceftriaxone.  **Interim History  Still complaining of some right leg pain she states that she is trying to exercise it and today she complained of "cold toes."  PT recommending SNF and social worker consulted for assistance with placement.  Urine grew out Klebsiella pneumoniae which was only resistant to ampicillin and Macrobid.  We will continue IV ceftriaxone for now and  treat for at least 5 days total and transition to p.o. antibiotics at discharge.  Because of her continued lower extremity complaints we will obtain a vascular ABI as well as a lower extremity duplex to rule out DVT.  Primary venous duplex was negative for DVT and ABIs are still pending to be done  Assessment & Plan:   Principal Problem:   Chronic venous hypertension (idiopathic) with ulcer of right lower extremity (CODE) (HCC) Active Problems:   Hypokalemia   AKI (acute kidney injury) ([place])   UTI (urinary tract infection)   Right hip pain  Idiopathic chronic venous hypertension of the right lower extremity with ulceration -Status post debridement and skin grafting in April 2020.   -Followed by Dr. [name].   -Ulcer does not appear infected at present.  No fever or leukocytosis.   -Patient is having problems with mobility secondary to this wound and is interested in SNF placement. -Tramadol PRN, Tylenol PRN pain; resume her home oxycodone dose as well as gabapentin patient still complaining of significant pain though nursing states that she appears fairly comfortable -Wound care consult -We will check a vascular ABI as well as a lower extremity venous duplex given her complaints of numbness and cold toes however she has good palpable pulses on examination -Venous duplex showed no evidence of any DVT upper portions of the examination were limited due to body habitus and poor ultrasound and tissue in her but did not show any cystic structures in the popliteal fossa -Vascular ABIs are still pending to be done -PT evaluation recommending skilled nursing facility -  Social work consult for SNF placement -Elevate extremities -Out of bed with assistance  Klebsiella Pneumoniae UTI -Afebrile no leukocytosis.  No signs of sepsis.  Did have a slight temperature of 100.1 yesterday but T-max is now 98.9 today -Patient endorses dysuria.  -UA done and showed turbid appearance with small hemoglobin, large  leukocytes, many bacteria, 6-10 RBCs per high-power field, greater than 50 WBCs with urine culture growing greater than 100,000 colony-forming units of Klebsiella -Does have penicillin allergy listed.   -Received ceftriaxone in the ED and has tolerated it well.     -Will continue IV ceftriaxone for now changed to p.o. antibiotics at discharge  Right hip pain along with right leg and foot pain -Patient is complaining of pain in her right hip which is limiting her mobility. -Right hip x-ray Unrevealing and will obtain Right Knee and Right Foot X-Ray -Right knee and right foot x-ray showed no acute osseous abnormality but the right knee did show advanced tricompartment arthritis with a small effusion -Tramadol PRN, Tylenol PRN pain; Resumed Home Oxycodone and Gabapentin -Check vitamin B12 and was 2991, TSH as well and B1 level -We will need outpatient nerve conduction studies -If Patient continues to have leg pain may need further imaging with a CT scan versus MRI but thinks her pain is getting a little bit better -Vascular ABI and LE Venous Duplex to be done -LE Duplex showed no DVT -Will add Lidocaine Patch and Diclofenac Gel as it helped the patient -Recommend out of bed to chair  Mild Hypokalemia -Potassium 3.4 on admission and improved to 3.8 -Continue to monitor replete as necessary -Repeat CMP in AM   AKI on CKD stage III, improving  -BUN 34, creatinine 1.9.  Baseline creatinine 1.4-1.5.  Possibly prerenal due to dehydration. -BUN/Cr is now improved to 15/1.17 -Resumed IV Lasix is 120 mg p.o. daily -Continue to monitor renal function -Monitor urine output  History of asthma Mild Acute Respiratory Failure with Hypoxia, improved  -Has very mild mild scattered end expiratory wheezing on exam but it is improving and I did not really hear any wheezing today.   -No increased work of breathing, tachypnea remains on 1-2L this morning and will have asked the nurse to wean and she is on  room air now -Combivent every 6 hours -C/w Dulera and Montelukast -Albuterol MDI every 2 hours as needed -Repeat CXR yesterday AM showed "Central venous congestion and small LEFT effusion. No interval change." -CXR this AM showed -Wean O2 as Tolerated and now off of O2 -We will need home Ambulatory screen prior to discharge if she is able to walk  Chronic Diastolic CHF -Stable.   -Has significant bilateral lower extremity edema secondary to lymphedema. -BNP normal but not accurate 2/2 Obesity and Body Habitus.   -Chest x-ray this AM showed "Central venous congestion and small LEFT effusion. No interval change." -Resume Home Lasix Dosing and gave an Extra IV Dose 20 mg this AM  -Strict I's/O's and Daily Weights; She is -3128 mL since admission -Repeat CXR in AM   Diet controlled type 2 diabetes -A1c 6.0 on 09/08/2018. -Sliding scale insulin sensitive and CBG checks -CBG's ranging from 93-165  Hypertension -Currently normotensive and on the lower side as BP was 106/82. C/w Hydralazine PRN. -Resume Home Lasix   Normocytic Anemia -Patient's Hgb/Hct is 10.9/36.7 -Checked Anemia Panel and iron level was 21, U IBC of 202, TIBC was 223, saturation ratios of 9%, ferritin of 43, folate of 16.3, vitamin B12 of 2991 -  Continue to Monitor for S/Sx of Bleeding; Currently no S/Sx of Bleeding  -Repeat CBC in AM   DVT prophylaxis: Enoxaparin 40 mg sq q24h Code Status: FULL CODE  Family Communication: No family present at bedside  Disposition Plan: SNF when medically stable and improved and bed available  Consultants:   None   Procedures: None   Antimicrobials:  Anti-infectives (From admission, onward)   Start     Dose/Rate Route Frequency Ordered Stop   11/10/18 1800  cefTRIAXone (ROCEPHIN) 1 g in sodium chloride 0.9 % 100 mL IVPB     1 g 200 mL/hr over 30 Minutes Intravenous Every 24 hours 11/09/18 1948     11/09/18 1915  cefTRIAXone (ROCEPHIN) 1 g in sodium chloride 0.9 % 100 mL  IVPB     1 g 200 mL/hr over 30 Minutes Intravenous  Once 11/09/18 1909 11/09/18 1953     Subjective: Seen and examined at bedside and states that her hip was not hurting as much but stated that she did not really want of food today.  No nausea or vomiting.  Has been weaned off of oxygen now.  Right leg is still hurting a little bit and is about to get out of bed.  No other concerns or complaints at this time and she has been using her incentive spirometer.  She states she is not coughing up some yellowish phlegm.  Objective: Vitals:   11/12/18 1500 11/12/18 2115 11/12/18 2212 11/13/18 0446  BP:   (!) 151/66 106/82  Pulse:   84 83  Resp: 18  16 16   Temp: 99.2 F (37.3 C)  99 F (37.2 C) 98.9 F (37.2 C)  TempSrc: Oral   Oral  SpO2: 96% 91% 95% 91%  Weight:      Height:        Intake/Output Summary (Last 24 hours) at 11/13/2018 1814 Last data filed at 11/13/2018 1733 Gross per 24 hour  Intake 2500 ml  Output 3800 ml  Net -1300 ml   Filed Weights   11/09/18 1020  Weight: (!) 157.4 kg   Examination: Physical Exam:  Constitutional: WN/WD super morbidly obese AAF in NAD and appears calm and comfortable Eyes: Lids and conjunctivae normal, sclerae anicteric  ENMT: External Ears, Nose appear normal. Grossly normal hearing. Mucous membranes are moist. Neck: Appears normal, supple, no cervical masses, normal ROM, no appreciable thyromegaly; Difficult to assess JVD Respiratory: Diminished to auscultation bilaterally, no wheezing, rales, rhonchi or crackles. Normal respiratory effort and patient is not tachypenic. No accessory muscle use. Unlabored Breathing Cardiovascular: RRR, no murmurs / rubs / gallops. S1 and S2 auscultated. Has LE Lymphedema  Abdomen: Soft, non-tender, Distended due to body habitus. No masses palpated. No appreciable hepatosplenomegaly. Bowel sounds positive x4.  GU: Deferred. Musculoskeletal: No clubbing / cyanosis of digits/nails. No joint deformity upper and  lower extremities.  Skin: No rashes, lesions, ulcers on a limited skin evaluation. No induration; Warm and dry.  Neurologic: CN 2-12 grossly intact with no focal deficits. Romberg sign and cerebellar reflexes not assessed.  Psychiatric: Normal judgment and insight. Alert and oriented x 3. Pleasant mood but slightly flat affect.   Data Reviewed: I have personally reviewed following labs and imaging studies  CBC: Recent Labs  Lab 11/09/18 1140 11/10/18 0821 11/11/18 0241 11/12/18 0332 11/13/18 0309  WBC 6.7 5.1 5.8 5.4 5.3  NEUTROABS 4.9 2.9 3.1 3.1 2.8  HGB 11.1* 9.8* 9.6* 9.7* 10.9*  HCT 36.7 32.6* 32.3* 32.2* 36.7  MCV 94.8  95.6 96.7 96.1 96.3  PLT 208 179 189 178 109   Basic Metabolic Panel: Recent Labs  Lab 11/09/18 1140 11/09/18 2136 11/10/18 0227 11/10/18 0821 11/11/18 0241 11/12/18 0332 11/13/18 0309  NA 138  --  140  --  140 141 139  K 3.4*  --  3.7  --  3.6 3.5 3.8  CL 97*  --  101  --  103 101 97*  CO2 29  --  26  --  28 31 30   GLUCOSE 134*  --  91  --  93 108* 133*  BUN 34*  --  27*  --  24* 17 15  CREATININE 1.96*  --  1.42*  --  1.39* 1.13* 1.16*  CALCIUM 9.1  --  8.3*  --  8.2* 8.1* 8.2*  MG  --  2.3  --  2.3 2.2 2.1 2.2  PHOS  --   --   --  4.1 4.0 3.5 3.4   GFR: Estimated Creatinine Clearance: 70.2 mL/min (A) (by C-G formula based on SCr of 1.16 mg/dL (H)). Liver Function Tests: Recent Labs  Lab 11/10/18 0821 11/11/18 0241 11/12/18 0332 11/13/18 0309  AST 31 27 25 23   ALT 17 16 16 14   ALKPHOS 72 67 66 70  BILITOT 0.5 0.5 0.6 1.0  PROT 5.5* 5.3* 5.3* 5.6*  ALBUMIN 2.8* 2.5* 2.5* 2.7*   No results for input(s): LIPASE, AMYLASE in the last 168 hours. No results for input(s): AMMONIA in the last 168 hours. Coagulation Profile: No results for input(s): INR, PROTIME in the last 168 hours. Cardiac Enzymes: No results for input(s): CKTOTAL, CKMB, CKMBINDEX, TROPONINI in the last 168 hours. BNP (last 3 results) No results for input(s): PROBNP in  the last 8760 hours. HbA1C: No results for input(s): HGBA1C in the last 72 hours. CBG: Recent Labs  Lab 11/12/18 1625 11/12/18 2213 11/13/18 0746 11/13/18 1124 11/13/18 1718  GLUCAP 112* 120* 93 165* 99   Lipid Profile: No results for input(s): CHOL, HDL, LDLCALC, TRIG, CHOLHDL, LDLDIRECT in the last 72 hours. Thyroid Function Tests: Recent Labs    11/11/18 1852  TSH 2.369   Anemia Panel: Recent Labs    11/11/18 0241  VITAMINB12 2,991*  FOLATE 16.3  FERRITIN 43  TIBC 223*  IRON 21*  RETICCTPCT 1.6   Sepsis Labs: No results for input(s): PROCALCITON, LATICACIDVEN in the last 168 hours.  Recent Results (from the past 240 hour(s))  Culture, Urine     Status: Abnormal   Collection Time: 11/09/18  6:29 PM   Specimen: Urine, Random  Result Value Ref Range Status   Specimen Description   Final    URINE, RANDOM Performed at [facility] [address], [place], [place] [address]    Special Requests   Final    NONE Performed at [facility], [place] [address], [place], [place] [address]    Culture >=100,000 COLONIES/mL KLEBSIELLA PNEUMONIAE (A)  Final   Report Status 11/11/2018 FINAL  Final   Organism ID, Bacteria KLEBSIELLA PNEUMONIAE (A)  Final      Susceptibility   Klebsiella pneumoniae - MIC*    AMPICILLIN RESISTANT Resistant     CEFAZOLIN <=4 SENSITIVE Sensitive     CEFTRIAXONE <=1 SENSITIVE Sensitive     CIPROFLOXACIN <=0.25 SENSITIVE Sensitive     GENTAMICIN <=1 SENSITIVE Sensitive     IMIPENEM <=0.25 SENSITIVE Sensitive     NITROFURANTOIN 128 RESISTANT Resistant     TRIMETH/SULFA <=20 SENSITIVE Sensitive  AMPICILLIN/SULBACTAM 4 SENSITIVE Sensitive     PIP/TAZO <=4 SENSITIVE Sensitive     Extended ESBL NEGATIVE Sensitive     * >=100,000 COLONIES/mL KLEBSIELLA PNEUMONIAE  SARS CORONAVIRUS 2 (TAT 6-24 HRS) Nasopharyngeal Nasopharyngeal Swab     Status: None   Collection Time: 11/09/18  7:25 PM   Specimen:  Nasopharyngeal Swab  Result Value Ref Range Status   SARS Coronavirus 2 NEGATIVE NEGATIVE Final    Comment: (NOTE) SARS-CoV-2 target nucleic acids are NOT DETECTED. The SARS-CoV-2 RNA is generally detectable in upper and lower respiratory specimens during the acute phase of infection. Negative results do not preclude SARS-CoV-2 infection, do not rule out co-infections with other pathogens, and should not be used as the sole basis for treatment or other patient management decisions. Negative results must be combined with clinical observations, patient history, and epidemiological information. The expected result is Negative. Fact Sheet for Patients: SugarRoll.be Fact Sheet for Healthcare Providers: https://www.woods-mathews.com/ This test is not yet approved or cleared by the [place] FDA and  has been authorized for detection and/or diagnosis of SARS-CoV-2 by FDA under an Emergency Use Authorization (EUA). This EUA will remain  in effect (meaning this test can be used) for the duration of the COVID-19 declaration under Section 56 4(b)(1) of the Act, 21 U.S.C. section 360bbb-3(b)(1), unless the authorization is terminated or revoked sooner. Performed at [facility], [place] [address], [place], [place] [address]     Radiology Studies: Dg Chest Port 1 View  Result Date: 11/13/2018 CLINICAL DATA:  Per order- SOB Pt HX: non smoker, DM, HTN, CHF EXAM: PORTABLE CHEST 1 VIEW COMPARISON:  11/12/2018 FINDINGS: Normal cardiac silhouette. Mild central venous pulmonary congestion similar prior. No focal consolidation. Small LEFT effusion. No pneumothorax. IMPRESSION: Central venous congestion and small LEFT effusion. No interval change. Electronically Signed   By: Suzy Bouchard M.D.   On: 11/13/2018 06:13   Dg Chest Port 1 View  Result Date: 11/12/2018 CLINICAL DATA:  Pre order- SOB EXAM: PORTABLE CHEST 1 VIEW COMPARISON:  11/11/2018  FINDINGS: Normal cardiac silhouette. Low lung volumes. Central venous congestion similar prior. No overt pulmonary edema. Mild linear interstitial markings cyst with mild interstitial edema. IMPRESSION: 1. No interval change. 2. Central venous congestion and mild interstitial edema. 3. Low lung volumes. Electronically Signed   By: Suzy Bouchard M.D.   On: 11/12/2018 05:55   Vas [place] Lower Extremity Venous (dvt)  Result Date: 11/13/2018  Lower Venous Study Indications: Swelling, and Pain.  Limitations: Body habitus and poor ultrasound/tissue interface. Comparison Study: previous study done 08/15/18 Performing Technologist: Abram Sander RVS  Examination Guidelines: A complete evaluation includes B-mode imaging, spectral Doppler, color Doppler, and power Doppler as needed of all accessible portions of each vessel. Bilateral testing is considered an integral part of a complete examination. Limited examinations for reoccurring indications may be performed as noted.  +---------+---------------+---------+-----------+----------+--------------+  RIGHT     Compressibility Phasicity Spontaneity Properties Thrombus Aging  +---------+---------------+---------+-----------+----------+--------------+  CFV       Full            Yes       Yes                                    +---------+---------------+---------+-----------+----------+--------------+  SFJ       Full                                                             +---------+---------------+---------+-----------+----------+--------------+  FV Prox   Full                                                             +---------+---------------+---------+-----------+----------+--------------+  FV Mid    Full                                                             +---------+---------------+---------+-----------+----------+--------------+  FV Distal                                                  Not visualized   +---------+---------------+---------+-----------+----------+--------------+  PFV       Full                                                             +---------+---------------+---------+-----------+----------+--------------+  POP       Full            Yes       Yes                                    +---------+---------------+---------+-----------+----------+--------------+  PTV                                                        Not visualized  +---------+---------------+---------+-----------+----------+--------------+  PERO                                                       Not visualized  +---------+---------------+---------+-----------+----------+--------------+   +---------+---------------+---------+-----------+----------+--------------+  LEFT      Compressibility Phasicity Spontaneity Properties Thrombus Aging  +---------+---------------+---------+-----------+----------+--------------+  CFV       Full            Yes       Yes                                    +---------+---------------+---------+-----------+----------+--------------+  SFJ       Full                                                             +---------+---------------+---------+-----------+----------+--------------+  FV  Prox   Full                                                             +---------+---------------+---------+-----------+----------+--------------+  FV Mid    Full                                                             +---------+---------------+---------+-----------+----------+--------------+  FV Distal Full                                                             +---------+---------------+---------+-----------+----------+--------------+  PFV       Full                                                             +---------+---------------+---------+-----------+----------+--------------+  POP       Full            Yes       Yes                                     +---------+---------------+---------+-----------+----------+--------------+  PTV                                                        Not visualized  +---------+---------------+---------+-----------+----------+--------------+  PERO                                                       Not visualized  +---------+---------------+---------+-----------+----------+--------------+     Summary: Right: There is no evidence of deep vein thrombosis in the lower extremity. However, portions of this examination were limited- see technologist comments above. No cystic structure found in the popliteal fossa. Left: There is no evidence of deep vein thrombosis in the lower extremity. However, portions of this examination were limited- see technologist comments above. No cystic structure found in the popliteal fossa.  *See table(s) above for measurements and observations. Electronically signed by Ruta Hinds MD on 11/13/2018 at 12:41:45 PM.    Final    Scheduled Meds:  Chlorhexidine Gluconate Cloth  6 each Topical Daily   diclofenac sodium  2 g Topical QID   enoxaparin (LOVENOX) injection  80 mg Subcutaneous QHS   famotidine  20 mg Oral BID   ferrous sulfate  325 mg Oral Q breakfast   folic  acid  0.5 mg Oral QHS   furosemide  120 mg Oral BID   gabapentin  100 mg Oral TID   insulin aspart  0-5 Units Subcutaneous QHS   insulin aspart  0-9 Units Subcutaneous TID WC   ipratropium-albuterol  3 mL Nebulization BID   lidocaine  1 patch Transdermal Q24H   mometasone-formoterol  2 puff Inhalation BID   montelukast  10 mg Oral QHS   Oxcarbazepine  300 mg Oral BID   simvastatin  40 mg Oral QHS   Continuous Infusions:  cefTRIAXone (ROCEPHIN)  IV 1 g (11/13/18 1729)    LOS: 3 days   Kerney Elbe, DO [facility] PAGER is on AMION  If 7PM-7AM, please contact night-coverage [email or web address] Password [facility] 11/13/2018, 6:14 PM

## 2018-11-13 NOTE — Plan of Care (Signed)
  Problem: Activity: Goal: Risk for activity intolerance will decrease Outcome: Progressing   Problem: Clinical Measurements: Goal: Will remain free from infection Outcome: Progressing   Problem: Clinical Measurements: Goal: Will remain free from infection Outcome: Progressing   Problem: Clinical Measurements: Goal: Cardiovascular complication will be avoided Outcome: Progressing   Problem: Elimination: Goal: Will not experience complications related to bowel motility Outcome: Progressing

## 2018-11-14 ENCOUNTER — Encounter (HOSPITAL_COMMUNITY): Payer: Medicare Other

## 2018-11-14 ENCOUNTER — Inpatient Hospital Stay (HOSPITAL_COMMUNITY): Payer: Medicare Other

## 2018-11-14 LAB — CBC WITH DIFFERENTIAL/PLATELET
Abs Immature Granulocytes: 0.03 10*3/uL (ref 0.00–0.07)
Basophils Absolute: 0.1 10*3/uL (ref 0.0–0.1)
Basophils Relative: 2 %
Eosinophils Absolute: 0.2 10*3/uL (ref 0.0–0.5)
Eosinophils Relative: 6 %
HCT: 35.4 % — ABNORMAL LOW (ref 36.0–46.0)
Hemoglobin: 10.6 g/dL — ABNORMAL LOW (ref 12.0–15.0)
Immature Granulocytes: 1 %
Lymphocytes Relative: 33 %
Lymphs Abs: 1.3 10*3/uL (ref 0.7–4.0)
MCH: 28.7 pg (ref 26.0–34.0)
MCHC: 29.9 g/dL — ABNORMAL LOW (ref 30.0–36.0)
MCV: 95.9 fL (ref 80.0–100.0)
Monocytes Absolute: 0.4 10*3/uL (ref 0.1–1.0)
Monocytes Relative: 10 %
Neutro Abs: 2 10*3/uL (ref 1.7–7.7)
Neutrophils Relative %: 48 %
Platelets: 168 10*3/uL (ref 150–400)
RBC: 3.69 MIL/uL — ABNORMAL LOW (ref 3.87–5.11)
RDW: 14.8 % (ref 11.5–15.5)
WBC: 4.1 10*3/uL (ref 4.0–10.5)
nRBC: 0 % (ref 0.0–0.2)

## 2018-11-14 LAB — COMPREHENSIVE METABOLIC PANEL
ALT: 14 U/L (ref 0–44)
AST: 19 U/L (ref 15–41)
Albumin: 2.7 g/dL — ABNORMAL LOW (ref 3.5–5.0)
Alkaline Phosphatase: 71 U/L (ref 38–126)
Anion gap: 10 (ref 5–15)
BUN: 16 mg/dL (ref 8–23)
CO2: 34 mmol/L — ABNORMAL HIGH (ref 22–32)
Calcium: 8.7 mg/dL — ABNORMAL LOW (ref 8.9–10.3)
Chloride: 95 mmol/L — ABNORMAL LOW (ref 98–111)
Creatinine, Ser: 1.27 mg/dL — ABNORMAL HIGH (ref 0.44–1.00)
GFR calc Af Amer: 50 mL/min — ABNORMAL LOW (ref >60)
GFR calc non Af Amer: 43 mL/min — ABNORMAL LOW (ref >60)
Glucose, Bld: 96 mg/dL (ref 70–99)
Potassium: 3.1 mmol/L — ABNORMAL LOW (ref 3.5–5.1)
Sodium: 139 mmol/L (ref 135–145)
Total Bilirubin: 0.4 mg/dL (ref 0.3–1.2)
Total Protein: 5.9 g/dL — ABNORMAL LOW (ref 6.5–8.1)

## 2018-11-14 LAB — GLUCOSE, CAPILLARY
Glucose-Capillary: 89 mg/dL (ref 70–99)
Glucose-Capillary: 140 mg/dL — ABNORMAL HIGH (ref 70–99)
Glucose-Capillary: 134 mg/dL — ABNORMAL HIGH (ref 70–99)
Glucose-Capillary: 99 mg/dL (ref 70–99)

## 2018-11-14 LAB — PHOSPHORUS: Phosphorus: 4.3 mg/dL (ref 2.5–4.6)

## 2018-11-14 LAB — MAGNESIUM: Magnesium: 2.1 mg/dL (ref 1.7–2.4)

## 2018-11-14 MED ORDER — COLCHICINE 0.6 MG PO TABS
0.6000 mg | ORAL_TABLET | Freq: Every day | ORAL | Status: DC
  Administered 2018-11-14 – 2018-11-16 (×3): 0.6 mg via ORAL
  Filled 2018-11-14 (×3): qty 1

## 2018-11-14 MED ORDER — VITAMIN D 25 MCG (1000 UNIT) PO TABS
1000.0000 [IU] | ORAL_TABLET | Freq: Every day | ORAL | Status: DC
  Administered 2018-11-14 – 2018-11-16 (×3): 1000 [IU] via ORAL
  Filled 2018-11-14 (×3): qty 1

## 2018-11-14 MED ORDER — POTASSIUM CHLORIDE CRYS ER 20 MEQ PO TBCR
40.0000 meq | EXTENDED_RELEASE_TABLET | Freq: Once | ORAL | Status: AC
  Administered 2018-11-14: 11:00:00 40 meq via ORAL
  Filled 2018-11-14: qty 2

## 2018-11-14 MED ORDER — CARVEDILOL 25 MG PO TABS
25.0000 mg | ORAL_TABLET | Freq: Every day | ORAL | Status: DC
  Administered 2018-11-14 – 2018-11-16 (×3): 25 mg via ORAL
  Filled 2018-11-14 (×3): qty 1

## 2018-11-14 MED ORDER — ALLOPURINOL 100 MG PO TABS
100.0000 mg | ORAL_TABLET | Freq: Every day | ORAL | Status: DC
  Administered 2018-11-14 – 2018-11-16 (×3): 100 mg via ORAL
  Filled 2018-11-14 (×3): qty 1

## 2018-11-14 MED ORDER — ASPIRIN 81 MG PO TBEC
81.0000 mg | DELAYED_RELEASE_TABLET | Freq: Every day | ORAL | Status: DC
  Filled 2018-11-14: qty 1

## 2018-11-14 MED ORDER — ASPIRIN EC 81 MG PO TBEC
81.0000 mg | DELAYED_RELEASE_TABLET | Freq: Every day | ORAL | Status: DC
  Administered 2018-11-14 – 2018-11-15 (×2): 81 mg via ORAL
  Filled 2018-11-14 (×2): qty 1

## 2018-11-14 MED ORDER — ONDANSETRON HCL 4 MG/2ML IJ SOLN
4.0000 mg | Freq: Four times a day (QID) | INTRAMUSCULAR | Status: DC | PRN
  Administered 2018-11-14: 16:00:00 4 mg via INTRAVENOUS
  Filled 2018-11-14 (×2): qty 2

## 2018-11-14 MED ORDER — CALCIUM CARBONATE ANTACID 500 MG PO CHEW: 2.0000 | CHEWABLE_TABLET | Freq: Every day | ORAL | Status: DC | PRN

## 2018-11-14 MED ORDER — POTASSIUM CHLORIDE CRYS ER 20 MEQ PO TBCR
20.0000 meq | EXTENDED_RELEASE_TABLET | Freq: Three times a day (TID) | ORAL | Status: DC
  Administered 2018-11-14 – 2018-11-16 (×6): 20 meq via ORAL
  Filled 2018-11-14 (×6): qty 1

## 2018-11-14 MED ORDER — POTASSIUM CHLORIDE CRYS ER 20 MEQ PO TBCR: 20.0000 meq | EXTENDED_RELEASE_TABLET | Freq: Three times a day (TID) | ORAL | Status: DC

## 2018-11-14 NOTE — Plan of Care (Signed)
Plan of care reviewed and discussed with the patient. 

## 2018-11-14 NOTE — Progress Notes (Signed)
PROGRESS NOTE    April [name]  SAY:[id number] DOB: 1950/11/02 DOA: 11/09/2018 PCP: [name], MD   Brief Narrative:  HPI per Dr. Shela Leff on 11/09/2018 April [name] is a 68 y.o. female with medical history significant of idiopathic chronic venous hypertension of the right lower extremity with ulceration status post debridement and skin grafting in April 2020, chronic lymphedema, anemia, asthma, CAD, CHF, CKD, diet-controlled type 2 diabetes, GERD, hypertension, hyperlipidemia, morbid obesity, neuropathy, PUD, sleep apnea presenting to the hospital for evaluation of right lower extremity pain secondary to a chronic wound.  Patient states she has had this wound on her right lower extremity since January and has had multiple surgeries done.  States this is causing her pain and her PCP has referred her to a pain clinic.  It has been difficult for her to transfer even from a chair due to this pain.  She is also having pain in her right hip for about a week now.  She did not fall.  Denies any fevers.  She has been having dysuria for the past few days.  No other complaints.  ED Course: Afebrile and hemodynamically stable.  No leukocytosis.  Potassium 3.4.  BUN 34, creatinine 1.9.  Baseline creatinine 1.4-1.5.  BNP normal.  UA with large amount of leukocytes, greater than 50 WBCs, and many bacteria on microscopic examination.  SARS-CoV-2 test pending.  Chest x-ray showing mild prominence of the bilateral perihilar interstitial markings, likely attenuated by low lung volumes.  No focal consolidation.  Patient received ceftriaxone.  **Interim History  Still complaining of some right leg pain she states that she is trying to exercise it and today she complained of "cold toes."  PT recommending SNF and social worker consulted for assistance with placement.  Urine grew out Klebsiella pneumoniae which was only resistant to ampicillin and Macrobid.  We will continue IV ceftriaxone for now and  treat for at least 5 days total and transition to p.o. antibiotics at discharge.  Because of her continued lower extremity complaints we will obtain a vascular ABI as well as a lower extremity duplex to rule out DVT.  Lower Extremity venous duplex was negative for DVT and ABIs are still pending to be done however cannot be done due to ulcer location and will have them done in the outpatient setting as she has good pulses.   Assessment & Plan:   Principal Problem:   Chronic venous hypertension (idiopathic) with ulcer of right lower extremity (CODE) (HCC) Active Problems:   Hypokalemia   AKI (acute kidney injury) ([place])   UTI (urinary tract infection)   Right hip pain  Idiopathic chronic venous hypertension of the right lower extremity with ulceration -Status post debridement and skin grafting in April 2020.   -Followed by Dr. [name].   -Ulcer does not appear infected at present.  No fever or leukocytosis.   -Patient is having problems with mobility secondary to this wound and is interested in SNF placement. -Tramadol PRN, Tylenol PRN pain; resume her home oxycodone dose as well as gabapentin patient still complaining of significant pain though nursing states that she appears fairly comfortable -Wound care consult -We will check a vascular ABI as well as a lower extremity venous duplex given her complaints of numbness and cold toes however she has good palpable pulses on examination -Venous duplex showed no evidence of any DVT upper portions of the examination were limited due to body habitus and poor ultrasound and tissue in her but  did not show any cystic structures in the popliteal fossa -Vascular ABIs were still pending to be done but will cancel order and defer to outpatient due to ulcer location and because the patient has excellent pulses -PT evaluation recommending skilled nursing facility -Social work consult for SNF placement and anticipate D/C to SNF in AM as they require repeat COVID  Testing  -Elevate extremities -Out of bed with assistance  Klebsiella Pneumoniae UTI -Afebrile no leukocytosis.  No signs of sepsis.  Did have a slight temperature of 100.1 yesterday but T-max is now 98.9 today -Patient endorses dysuria.  -UA done and showed turbid appearance with small hemoglobin, large leukocytes, many bacteria, 6-10 RBCs per high-power field, greater than 50 WBCs with urine culture growing greater than 100,000 colony-forming units of Klebsiella -Does have penicillin allergy listed.   -Received ceftriaxone in the ED and has tolerated it well.     -Will continue IV ceftriaxone for now changed to p.o. antibiotics at discharge -Today is Day 6 of Abx -Continue to Monitor   Right hip pain along with right leg and foot pain -Patient is complaining of pain in her right hip which is limiting her mobility. -Right hip x-ray Unrevealing and will obtain Right Knee and Right Foot X-Ray -Right knee and right foot x-ray showed no acute osseous abnormality but the right knee did show advanced tricompartment arthritis with a small effusion -Tramadol PRN, Tylenol PRN pain; Resumed Home Oxycodone and Gabapentin -Check vitamin B12 and was 2991, TSH as well and B1 level -We will need outpatient nerve conduction studies -If Patient continues to have leg pain may need further imaging with a CT scan versus MRI but thinks her pain is getting a little bit better -Vascular ABI cancelled  -LE Duplex showed no DVT -Will add Lidocaine Patch and Diclofenac Gel as it helped the patient -Recommend out of bed to chair  Mild Hypokalemia -Potassium 3.4 on admission and improved to 3.8 and now was 3.1 -Resume Home po KCl with 20 mEQ TID and give an extra 40 mEQ this AM -In the setting of Lasix -Continue to monitor replete as necessary -Repeat CMP in AM   AKI on CKD stage III, improving  -BUN 34, creatinine 1.9.  Baseline creatinine 1.4-1.5.  Possibly prerenal due to dehydration. -BUN/Cr is now  improved to 16/1.27 -Resumed Home Lasix at 120 mg p.o. BID -Continue to monitor renal function -Monitor urine output -Repeat CMP n AM   History of asthma Mild Acute Respiratory Failure with Hypoxia, improved and off of O2 -Has very mild mild scattered end expiratory wheezing on exam but it is improving and I did not really hear any wheezing today.   -No increased work of breathing, tachypnea remains on 1-2L this morning and will have asked the nurse to wean and she is on room air now -Combivent every 6 hours -C/w Dulera and Montelukast -Albuterol MDI every 2 hours as needed -Repeat CXR yesterday AM showed "Central venous congestion and small LEFT effusion. No interval change." -CXR this AM showed -Wean O2 as Tolerated and now off of O2 -We will need home Ambulatory screen prior to discharge if she is able to walk  Chronic Diastolic CHF -Stable.   -Has significant bilateral lower extremity edema secondary to lymphedema. -BNP normal but not accurate 2/2 Obesity and Body Habitus.   -Chest x-ray this AM showed "Central venous congestion and small LEFT effusion. No interval change." -Resume Home Lasix Dosing and gave an Extra IV Dose 20 mg this  AM  -Strict I's/O's and Daily Weights; She is -4.168 L since admission -Repeat CXR in AM   Diet controlled type 2 diabetes -A1c 6.0 on 09/08/2018. -Sliding scale insulin sensitive and CBG checks -CBG's ranging from 86-140  Hypertension -Currently normotensive and on the lower side as BP was 104/50. C/w Hydralazine PRN. -Resume Home Lasix   Normocytic Anemia -Patient's Hgb/Hct is stable at 10.6/35.4 -Checked Anemia Panel and iron level was 21, U IBC of 202, TIBC was 223, saturation ratios of 9%, ferritin of 43, folate of 16.3, vitamin B12 of 2991 -Continue to Monitor for S/Sx of Bleeding; Currently no S/Sx of Bleeding  -Repeat CBC in AM   Nausea -Added IV Zofran -Continue to Monitor but She ate all her Breakfast and was eating when I  walked in  Metabolic alkalosis -In the setting of Lasix administration--patient CO2 is now 34 and chloride is 95 -Continue monitor and trend and repeat CMP in a.m.  DVT prophylaxis: Enoxaparin 40 mg sq q24h Code Status: FULL CODE  Family Communication: No family present at bedside  Disposition Plan: SNF likely in the AM   Consultants:   None   Procedures:  LE VENOUS DUPLEX    Antimicrobials:  Anti-infectives (From admission, onward)   Start     Dose/Rate Route Frequency Ordered Stop   11/10/18 1800  cefTRIAXone (ROCEPHIN) 1 g in sodium chloride 0.9 % 100 mL IVPB     1 g 200 mL/hr over 30 Minutes Intravenous Every 24 hours 11/09/18 1948     11/09/18 1915  cefTRIAXone (ROCEPHIN) 1 g in sodium chloride 0.9 % 100 mL IVPB     1 g 200 mL/hr over 30 Minutes Intravenous  Once 11/09/18 1909 11/09/18 1953     Subjective: Seen and examined at bedside he was nauseous this morning but she was eating her breakfast when I walked in.  Still complaining of some pain in the leg and hip but states is a little bit better.  No chest pain, lightheadedness or dizziness and no other concerns or complaints at this time.  Objective: Vitals:   11/13/18 2201 11/14/18 0539 11/14/18 0736 11/14/18 1432  BP: (!) 149/79 139/72  (!) 104/50  Pulse: 85 80  85  Resp: 18 18  17   Temp: 98.2 F (36.8 C) 98.3 F (36.8 C)  99.1 F (37.3 C)  TempSrc:      SpO2: 95% 91% 96% 91%  Weight:      Height:        Intake/Output Summary (Last 24 hours) at 11/14/2018 1731 Last data filed at 11/14/2018 1431 Gross per 24 hour  Intake 1060 ml  Output 2500 ml  Net -1440 ml   Filed Weights   11/09/18 1020  Weight: (!) 157.4 kg   Examination: Physical Exam:  Constitutional: WN/WD Super Morbidly obese AAF in NAD and appears calm and comfortable eating breakfast but complaining of Nausea Eyes: Lids and conjunctivae normal, sclerae anicteric  ENMT: External Ears, Nose appear normal. Grossly normal hearing. Mucous  membranes are moist. Neck: Appears normal, supple, no cervical masses, normal ROM, no appreciable thyromegaly; Difficult to assess JVD due to body habitus Respiratory: Diminished to auscultation bilaterally, no wheezing, rales, rhonchi or crackles. Normal respiratory effort and patient is not tachypenic. No accessory muscle use. Unlabored breathing  Cardiovascular: RRR, no murmurs / rubs / gallops. S1 and S2 auscultated. Has LE Lymphedema and a Right Leg Ulcer Abdomen: Soft, non-tender, Distended due to body habitus. No masses palpated. No appreciable hepatosplenomegaly.  Bowel sounds positive x4.  GU: Deferred. Musculoskeletal: No clubbing / cyanosis of digits/nails. No contractures or cyanosis Skin: Has a Right Leg Ulcer that is non-infected and likely from chronic venous insuffiencey. No induration; Warm and dry.  Neurologic: CN 2-12 grossly intact with no focal deficits. Romberg sign and cerebellar reflexes not assessed.  Psychiatric: Normal judgment and insight. Alert and oriented x 3. Anxious mood and appropriate affect.   Data Reviewed: I have personally reviewed following labs and imaging studies  CBC: Recent Labs  Lab 11/10/18 0821 11/11/18 0241 11/12/18 0332 11/13/18 0309 11/14/18 0759  WBC 5.1 5.8 5.4 5.3 4.1  NEUTROABS 2.9 3.1 3.1 2.8 2.0  HGB 9.8* 9.6* 9.7* 10.9* 10.6*  HCT 32.6* 32.3* 32.2* 36.7 35.4*  MCV 95.6 96.7 96.1 96.3 95.9  PLT 179 189 178 197 660   Basic Metabolic Panel: Recent Labs  Lab 11/10/18 0227 11/10/18 0821 11/11/18 0241 11/12/18 0332 11/13/18 0309 11/14/18 0759  NA 140  --  140 141 139 139  K 3.7  --  3.6 3.5 3.8 3.1*  CL 101  --  103 101 97* 95*  CO2 26  --  28 31 30  34*  GLUCOSE 91  --  93 108* 133* 96  BUN 27*  --  24* 17 15 16   CREATININE 1.42*  --  1.39* 1.13* 1.16* 1.27*  CALCIUM 8.3*  --  8.2* 8.1* 8.2* 8.7*  MG  --  2.3 2.2 2.1 2.2 2.1  PHOS  --  4.1 4.0 3.5 3.4 4.3   GFR: Estimated Creatinine Clearance: 64.1 mL/min (A) (by C-G  formula based on SCr of 1.27 mg/dL (H)). Liver Function Tests: Recent Labs  Lab 11/10/18 0821 11/11/18 0241 11/12/18 0332 11/13/18 0309 11/14/18 0759  AST 31 27 25 23 19   ALT 17 16 16 14 14   ALKPHOS 72 67 66 70 71  BILITOT 0.5 0.5 0.6 1.0 0.4  PROT 5.5* 5.3* 5.3* 5.6* 5.9*  ALBUMIN 2.8* 2.5* 2.5* 2.7* 2.7*   No results for input(s): LIPASE, AMYLASE in the last 168 hours. No results for input(s): AMMONIA in the last 168 hours. Coagulation Profile: No results for input(s): INR, PROTIME in the last 168 hours. Cardiac Enzymes: No results for input(s): CKTOTAL, CKMB, CKMBINDEX, TROPONINI in the last 168 hours. BNP (last 3 results) No results for input(s): PROBNP in the last 8760 hours. HbA1C: No results for input(s): HGBA1C in the last 72 hours. CBG: Recent Labs  Lab 11/13/18 1718 11/13/18 2157 11/14/18 0738 11/14/18 1207 11/14/18 1625  GLUCAP 99 86 89 140* 134*   Lipid Profile: No results for input(s): CHOL, HDL, LDLCALC, TRIG, CHOLHDL, LDLDIRECT in the last 72 hours. Thyroid Function Tests: Recent Labs    11/11/18 1852  TSH 2.369   Anemia Panel: No results for input(s): VITAMINB12, FOLATE, FERRITIN, TIBC, IRON, RETICCTPCT in the last 72 hours. Sepsis Labs: No results for input(s): PROCALCITON, LATICACIDVEN in the last 168 hours.  Recent Results (from the past 240 hour(s))  Culture, Urine     Status: Abnormal   Collection Time: 11/09/18  6:29 PM   Specimen: Urine, Random  Result Value Ref Range Status   Specimen Description   Final    URINE, RANDOM Performed at [facility] [address], [place], [place] [address]    Special Requests   Final    NONE Performed at [facility], [place] [address], [place], [place] [address]    Culture >=100,000 COLONIES/mL KLEBSIELLA PNEUMONIAE (A)  Final   Report Status 11/11/2018 FINAL  Final   Organism ID, Bacteria KLEBSIELLA PNEUMONIAE (A)  Final      Susceptibility   Klebsiella  pneumoniae - MIC*    AMPICILLIN RESISTANT Resistant     CEFAZOLIN <=4 SENSITIVE Sensitive     CEFTRIAXONE <=1 SENSITIVE Sensitive     CIPROFLOXACIN <=0.25 SENSITIVE Sensitive     GENTAMICIN <=1 SENSITIVE Sensitive     IMIPENEM <=0.25 SENSITIVE Sensitive     NITROFURANTOIN 128 RESISTANT Resistant     TRIMETH/SULFA <=20 SENSITIVE Sensitive     AMPICILLIN/SULBACTAM 4 SENSITIVE Sensitive     PIP/TAZO <=4 SENSITIVE Sensitive     Extended ESBL NEGATIVE Sensitive     * >=100,000 COLONIES/mL KLEBSIELLA PNEUMONIAE  SARS CORONAVIRUS 2 (TAT 6-24 HRS) Nasopharyngeal Nasopharyngeal Swab     Status: None   Collection Time: 11/09/18  7:25 PM   Specimen: Nasopharyngeal Swab  Result Value Ref Range Status   SARS Coronavirus 2 NEGATIVE NEGATIVE Final    Comment: (NOTE) SARS-CoV-2 target nucleic acids are NOT DETECTED. The SARS-CoV-2 RNA is generally detectable in upper and lower respiratory specimens during the acute phase of infection. Negative results do not preclude SARS-CoV-2 infection, do not rule out co-infections with other pathogens, and should not be used as the sole basis for treatment or other patient management decisions. Negative results must be combined with clinical observations, patient history, and epidemiological information. The expected result is Negative. Fact Sheet for Patients: SugarRoll.be Fact Sheet for Healthcare Providers: https://www.woods-mathews.com/ This test is not yet approved or cleared by the [place] FDA and  has been authorized for detection and/or diagnosis of SARS-CoV-2 by FDA under an Emergency Use Authorization (EUA). This EUA will remain  in effect (meaning this test can be used) for the duration of the COVID-19 declaration under Section 56 4(b)(1) of the Act, 21 U.S.C. section 360bbb-3(b)(1), unless the authorization is terminated or revoked sooner. Performed at [facility], [place] [address],  [place], [place] [address]     Radiology Studies: Dg Chest Port 1 View  Result Date: 11/13/2018 CLINICAL DATA:  Per order- SOB Pt HX: non smoker, DM, HTN, CHF EXAM: PORTABLE CHEST 1 VIEW COMPARISON:  11/12/2018 FINDINGS: Normal cardiac silhouette. Mild central venous pulmonary congestion similar prior. No focal consolidation. Small LEFT effusion. No pneumothorax. IMPRESSION: Central venous congestion and small LEFT effusion. No interval change. Electronically Signed   By: Suzy Bouchard M.D.   On: 11/13/2018 06:13   Scheduled Meds:  allopurinol  100 mg Oral Daily   aspirin EC  81 mg Oral QHS   carvedilol  25 mg Oral Daily   Chlorhexidine Gluconate Cloth  6 each Topical Daily   cholecalciferol  1,000 Units Oral Daily   colchicine  0.6 mg Oral Daily   diclofenac sodium  2 g Topical QID   enoxaparin (LOVENOX) injection  80 mg Subcutaneous QHS   famotidine  20 mg Oral BID   ferrous sulfate  325 mg Oral Q breakfast   folic acid  0.5 mg Oral QHS   furosemide  120 mg Oral BID   gabapentin  100 mg Oral TID   insulin aspart  0-5 Units Subcutaneous QHS   insulin aspart  0-9 Units Subcutaneous TID WC   ipratropium-albuterol  3 mL Nebulization BID   lidocaine  1 patch Transdermal Q24H   mometasone-formoterol  2 puff Inhalation BID   montelukast  10 mg Oral QHS   Oxcarbazepine  300 mg Oral BID  potassium chloride SA  20 mEq Oral TID   simvastatin  40 mg Oral QHS   Continuous Infusions:  cefTRIAXone (ROCEPHIN)  IV Stopped (11/13/18 1759)    LOS: 4 days   Kerney Elbe, DO [facility] PAGER is on [place]  If 7PM-7AM, please contact night-coverage [email or web address] Password [facility] 11/14/2018, 5:31 PM

## 2018-11-14 NOTE — Care Management Important Message (Signed)
Important Message  Patient Details IM Letter given to Kathrin Greathouse SW to present to the Patient Name: April [name] MRN: [id number] Date of Birth: 01/07/1951   Medicare Important Message Given:  Yes     Kerin Salen 11/14/2018, 1:57 PM

## 2018-11-14 NOTE — TOC Progression Note (Addendum)
Transition of Care [facility]) - Progression Note    Patient Details  Name: April [name] MRN: [id number] Date of Birth: 01-May-1950  Transition of Care [facility]) CM/SW [place], [place] Phone Number: 11/14/2018, 11:06 AM  Clinical Narrative:  [facility] authorization received.   [facility] request updated COVID-19 test, pending    Expected Discharge Plan: Skilled Nursing Facility Barriers to Discharge: [id number] test pending   Expected Discharge Plan and Services Expected Discharge Plan: [place]                                               Social Determinants of Health (SDOH) Interventions    Readmission Risk Interventions No flowsheet data found.

## 2018-11-14 NOTE — Progress Notes (Signed)
Physical Therapy Treatment Patient Details Name: April [name] MRN: [id number] DOB: Oct 18, 1950 Today's Date: 11/14/2018    History of Present Illness Pt is a 68 y/o female admitted secondary to weakness and inability to walk.   On recent hospital stay in 8/20, pt with RLE pain at baseline. Imaging revealed L calcaneous osteomyelitis. Pt also with likely L achilles strain vs partial tear. PMH includes obesity, CAD, asthma, HTN, and DM.     PT Comments    Pt highly motivated but mobility limited. General bed mobility comments: assisted with upper body supine to sit and used bed pad to copmplete self scooting to EOB.   General transfer comment: + 2 side by side assist off elevated bed as pt shifted upper body weight forwatd and pulled herself up using walker present with partial up right posture, pt was able to take a few side steps to recliner but with MAX c/o R hip pain.  Reapplied Elsie Saas and asked Unit Secretary to call portables for a [facility].  Pt will need ST Rehab at SNF prior to returning home.   Follow Up Recommendations  SNF     Equipment Recommendations  None recommended by PT    Recommendations for Other Services       Precautions / Restrictions Precautions Precautions: Fall Restrictions Weight Bearing Restrictions: No Other Position/Activity Restrictions: WBAT    Mobility  Bed Mobility Overal bed mobility: Needs Assistance Bed Mobility: Supine to Sit     Supine to sit: Max assist     General bed mobility comments: assisted with upper body supine to sit and used bed pad to copmplete self scooting to EOB  Transfers Overall transfer level: Needs assistance Equipment used: Rolling walker (2 wheeled) Transfers: Sit to/from [facility] Sit to Stand: Mod assist;Min assist Stand pivot transfers: Mod assist       General transfer comment: + 2 side by side assist off elevated bed as pt shifted upper body weight forwatd and pulled herself up  using walker present with partial up right posture, pt was able to take a few side steps to recliner but with MAX c/o R hip pain  Ambulation/Gait             General Gait Details: transfers only due to R hip pain and inability to safely weight shift/support self.   Stairs             Wheelchair Mobility    Modified Rankin (Stroke Patients Only)       Balance                                            Cognition Arousal/Alertness: Awake/alert Behavior During Therapy: WFL for tasks assessed/performed Overall Cognitive Status: Within Functional Limits for tasks assessed                                        Exercises      General Comments        Pertinent Vitals/Pain Pain Assessment: No/denies pain Pain Location: R hip Pain Descriptors / Indicators: Discomfort;Grimacing;Throbbing Pain Intervention(s): Monitored during session;Repositioned    Home Living                      Prior Function  PT Goals (current goals can now be found in the care plan section) Progress towards PT goals: Progressing toward goals    Frequency    Min 3X/week      PT Plan Current plan remains appropriate    Co-evaluation              AM-PAC PT "6 Clicks" Mobility   Outcome Measure  Help needed turning from your back to your side while in a flat bed without using bedrails?: A Lot Help needed moving from lying on your back to sitting on the side of a flat bed without using bedrails?: A Lot Help needed moving to and from a bed to a chair (including a wheelchair)?: Total Help needed standing up from a chair using your arms (e.g., wheelchair or bedside chair)?: Total Help needed to walk in hospital room?: Total Help needed climbing 3-5 steps with a railing? : Total 6 Click Score: 8    End of Session   Activity Tolerance: Patient limited by pain Patient left: in chair;with call bell/phone within reach;Other  (comment)(Peri Elza Rafter) Nurse Communication: Mobility status PT Visit Diagnosis: Muscle weakness (generalized) (M62.81);Difficulty in walking, not elsewhere classified (R26.2);Pain Pain - Right/Left: Right     Time: 9622-2979 PT Time Calculation (min) (ACUTE ONLY): 30 min  Charges:  $Gait Training: 8-22 mins $Therapeutic Activity: 8-22 mins                     Rica Koyanagi  PTA Acute  Rehabilitation Services Pager      [phone] Office      [phone]

## 2018-11-15 ENCOUNTER — Inpatient Hospital Stay (HOSPITAL_COMMUNITY): Payer: Medicare Other

## 2018-11-15 DIAGNOSIS — J9601 Acute respiratory failure with hypoxia: Secondary | ICD-10-CM

## 2018-11-15 LAB — COMPREHENSIVE METABOLIC PANEL
ALT: 15 U/L (ref 0–44)
AST: 21 U/L (ref 15–41)
Albumin: 2.8 g/dL — ABNORMAL LOW (ref 3.5–5.0)
Alkaline Phosphatase: 75 U/L (ref 38–126)
Anion gap: 10 (ref 5–15)
BUN: 24 mg/dL — ABNORMAL HIGH (ref 8–23)
CO2: 32 mmol/L (ref 22–32)
Calcium: 8.8 mg/dL — ABNORMAL LOW (ref 8.9–10.3)
Chloride: 96 mmol/L — ABNORMAL LOW (ref 98–111)
Creatinine, Ser: 1.54 mg/dL — ABNORMAL HIGH (ref 0.44–1.00)
GFR calc Af Amer: 40 mL/min — ABNORMAL LOW (ref >60)
GFR calc non Af Amer: 34 mL/min — ABNORMAL LOW (ref >60)
Glucose, Bld: 107 mg/dL — ABNORMAL HIGH (ref 70–99)
Potassium: 4.5 mmol/L (ref 3.5–5.1)
Sodium: 138 mmol/L (ref 135–145)
Total Bilirubin: 0.3 mg/dL (ref 0.3–1.2)
Total Protein: 6.1 g/dL — ABNORMAL LOW (ref 6.5–8.1)

## 2018-11-15 LAB — CBC WITH DIFFERENTIAL/PLATELET
Abs Immature Granulocytes: 0.03 10*3/uL (ref 0.00–0.07)
Basophils Absolute: 0.1 10*3/uL (ref 0.0–0.1)
Basophils Relative: 1 %
Eosinophils Absolute: 0.3 10*3/uL (ref 0.0–0.5)
Eosinophils Relative: 7 %
HCT: 35.1 % — ABNORMAL LOW (ref 36.0–46.0)
Hemoglobin: 10.6 g/dL — ABNORMAL LOW (ref 12.0–15.0)
Immature Granulocytes: 1 %
Lymphocytes Relative: 34 %
Lymphs Abs: 1.7 10*3/uL (ref 0.7–4.0)
MCH: 29.2 pg (ref 26.0–34.0)
MCHC: 30.2 g/dL (ref 30.0–36.0)
MCV: 96.7 fL (ref 80.0–100.0)
Monocytes Absolute: 0.5 10*3/uL (ref 0.1–1.0)
Monocytes Relative: 9 %
Neutro Abs: 2.4 10*3/uL (ref 1.7–7.7)
Neutrophils Relative %: 48 %
Platelets: 185 10*3/uL (ref 150–400)
RBC: 3.63 MIL/uL — ABNORMAL LOW (ref 3.87–5.11)
RDW: 14.8 % (ref 11.5–15.5)
WBC: 4.9 10*3/uL (ref 4.0–10.5)
nRBC: 0 % (ref 0.0–0.2)

## 2018-11-15 LAB — GLUCOSE, CAPILLARY
Glucose-Capillary: 92 mg/dL (ref 70–99)
Glucose-Capillary: 99 mg/dL (ref 70–99)
Glucose-Capillary: 121 mg/dL — ABNORMAL HIGH (ref 70–99)
Glucose-Capillary: 107 mg/dL — ABNORMAL HIGH (ref 70–99)

## 2018-11-15 LAB — MAGNESIUM: Magnesium: 2.2 mg/dL (ref 1.7–2.4)

## 2018-11-15 LAB — SARS CORONAVIRUS 2 (TAT 6-24 HRS): SARS Coronavirus 2: NEGATIVE

## 2018-11-15 LAB — PHOSPHORUS: Phosphorus: 5.1 mg/dL — ABNORMAL HIGH (ref 2.5–4.6)

## 2018-11-15 MED ORDER — FUROSEMIDE 10 MG/ML IJ SOLN
40.0000 mg | Freq: Once | INTRAMUSCULAR | Status: AC
  Administered 2018-11-15: 10:00:00 40 mg via INTRAVENOUS
  Filled 2018-11-15: qty 4

## 2018-11-15 MED ORDER — PHENOL 1.4 % MT LIQD
1.0000 | OROMUCOSAL | Status: DC | PRN
  Administered 2018-11-15: 1 via OROMUCOSAL
  Filled 2018-11-15: qty 177

## 2018-11-15 NOTE — Progress Notes (Signed)
PROGRESS NOTE    April [name]  ACZ:[id number] DOB: [birth year] DOA: 11/09/2018 PCP: [name], MD   Brief Narrative:  HPI per Dr. Shela Leff on 11/09/2018 April [name] is a 68 y.o. female with medical history significant of idiopathic chronic venous hypertension of the right lower extremity with ulceration status post debridement and skin grafting in April 2020, chronic lymphedema, anemia, asthma, CAD, CHF, CKD, diet-controlled type 2 diabetes, GERD, hypertension, hyperlipidemia, morbid obesity, neuropathy, PUD, sleep apnea presenting to the hospital for evaluation of right lower extremity pain secondary to a chronic wound.  Patient states she has had this wound on her right lower extremity since January and has had multiple surgeries done.  States this is causing her pain and her PCP has referred her to a pain clinic.  It has been difficult for her to transfer even from a chair due to this pain.  She is also having pain in her right hip for about a week now.  She did not fall.  Denies any fevers.  She has been having dysuria for the past few days.  No other complaints.  ED Course: Afebrile and hemodynamically stable.  No leukocytosis.  Potassium 3.4.  BUN 34, creatinine 1.9.  Baseline creatinine 1.4-1.5.  BNP normal.  UA with large amount of leukocytes, greater than 50 WBCs, and many bacteria on microscopic examination.  SARS-CoV-2 test pending.  Chest x-ray showing mild prominence of the bilateral perihilar interstitial markings, likely attenuated by low lung volumes.  No focal consolidation.  Patient received ceftriaxone.  **Interim History  Still complaining of some right leg pain she states that she is trying to exercise it and today she complained of "cold toes."  PT recommending SNF and social worker consulted for assistance with placement.  Urine grew out Klebsiella pneumoniae which was only resistant to ampicillin and Macrobid.  We will continue IV ceftriaxone for now and  treat for at least 5 days total and transition to p.o. antibiotics at discharge.  Because of her continued lower extremity complaints we will obtain a vascular ABI as well as a lower extremity duplex to rule out DVT.  Lower Extremity venous duplex was negative for DVT and ABIs are still pending to be done however cannot be done due to ulcer location and will have them done in the outpatient setting as she has good pulses.   Patient was to be discharged today to SNF however she is noted to be hypoxic on room air at 84% and complaining of weakness and chest x-ray was ordered but never done this morning and it came back later this afternoon and showed "Mild cardiomegaly. Aortic atherosclerosis. Pulmonary venous hypertension without edema."  Given 1 additional dose of IV Lasix and an incentive spirometer as well as a flutter valve.  Likely she has obesity hypoventilation syndrome.  Likely will be discharged in the a.m. if respiratory status stays stable  Assessment & Plan:   Principal Problem:   Chronic venous hypertension (idiopathic) with ulcer of right lower extremity (CODE) (HCC) Active Problems:   Hypokalemia   AKI (acute kidney injury) ([place])   UTI (urinary tract infection)   Right hip pain  Idiopathic chronic venous hypertension of the right lower extremity with ulceration -Status post debridement and skin grafting in April 2020.   -Followed by Dr. [name].   -Ulcer does not appear infected at present.  No fever or leukocytosis.   -Patient is having problems with mobility secondary to this wound and is interested  in SNF placement. -Tramadol PRN, Tylenol PRN pain; resume her home oxycodone dose as well as gabapentin patient still complaining of significant pain though nursing states that she appears fairly comfortable -Wound care consult -We will check a vascular ABI as well as a lower extremity venous duplex given her complaints of numbness and cold toes however she has good palpable pulses on  examination -Venous duplex showed no evidence of any DVT upper portions of the examination were limited due to body habitus and poor ultrasound and tissue in her but did not show any cystic structures in the popliteal fossa -Vascular ABIs were still pending to be done but will cancel order and defer to outpatient due to ulcer location and because the patient has excellent pulses -PT evaluation recommending skilled nursing facility -Social work consult for SNF placement and anticipate D/C to SNF in AM as they require repeat COVID Testing  -Elevate extremities -Out of bed with assistance -Ambulate if possible  Klebsiella Pneumoniae UTI -Afebrile no leukocytosis.  No signs of sepsis.  Did have a slight temperature of 100.1 yesterday but T-max is now 98.9 today -Patient endorses dysuria.  -UA done and showed turbid appearance with small hemoglobin, large leukocytes, many bacteria, 6-10 RBCs per high-power field, greater than 50 WBCs with urine culture growing greater than 100,000 colony-forming units of Klebsiella -Does have penicillin allergy listed.   -Received ceftriaxone in the ED and has tolerated it well.     -Will continue IV ceftriaxone for now changed to p.o. antibiotics at discharge -Completed 5 days of antibiotics total -Continue to Monitor   Right hip pain along with right leg and foot pain -Patient is complaining of pain in her right hip which is limiting her mobility. -Right hip x-ray Unrevealing and will obtain Right Knee and Right Foot X-Ray -Right knee and right foot x-ray showed no acute osseous abnormality but the right knee did show advanced tricompartment arthritis with a small effusion -Tramadol PRN, Tylenol PRN pain; Resumed Home Oxycodone and Gabapentin -Check vitamin B12 and was 2991, TSH as well and B1 level -We will need outpatient nerve conduction studies -If Patient continues to have leg pain may need further imaging with a CT scan versus MRI but thinks her pain is  getting a little bit better -Vascular ABI cancelled  -LE Duplex showed no DVT -Will add Lidocaine Patch and Diclofenac Gel as it helped the patient -Recommend out of bed to chair  Mild Hypokalemia -Potassium 3.4 on admission and improved to 3.8 and now was 3.1 -Resume Home po KCl with 20 mEQ TID and give an extra 40 mEQ this AM -In the setting of Lasix -Continue to monitor replete as necessary -Repeat CMP in AM   AKI on CKD stage III, improving  -BUN 34, creatinine 1.9.  Baseline creatinine 1.4-1.5.  Possibly prerenal due to dehydration. -BUN/Cr had improved to 16/1.27 but today it worsened to 24/1.54 -Resumed Home Lasix at 120 mg p.o. BID given additional dose of IV Lasix today -Continue to monitor renal function -Monitor urine output -Repeat CMP n AM   History of asthma Mild Acute Respiratory Failure with Hypoxia, improved initially but now again decompensated and was found to be hypoxic on room -Has very mild mild scattered end expiratory wheezing on exam previously but none heard today on examination.   -No increased work of breathing, tachypnea remains on 2 L this morning and will have asked the nurse to wean and she is on room air now -Combivent every 6 hours -  C/w Dulera and Montelukast -Albuterol MDI every 2 hours as needed -Repeat CXR yesterday AM showed "Mild cardiomegaly. Aortic atherosclerosis. Pulmonary venous hypertension without edema. -Wean O2 as Tolerated and now off of O2 -We will need home Ambulatory screen prior to discharge if she is able to walk and will order  -If continues to be hypoxic and may need a CTA scan of her chest to r/o PE but LE venous Duplex was Negative  -She did say that she is coughing up some green sputum now but is not very significant and is intermittent and did not have any today  Mild Acute on Chronic Diastolic CHF -Stable.   -Has significant bilateral lower extremity edema secondary to lymphedema. -BNP normal but not accurate 2/2  Obesity and Body Habitus.   -Chest x-ray this AM showed "Mild cardiomegaly. Aortic atherosclerosis. Pulmonary venous hypertension without edema. -Resume Home Lasix Dosing and gave an Extra IV Dose 40 mg this AM  -Strict I's/O's and Daily Weights; She is -5,358 L since admission -Repeat CXR in AM   Diet controlled type 2 diabetes -A1c 6.0 on 09/08/2018. -Sliding scale insulin sensitive and CBG checks -CBG's ranging from 92-121  Hypertension -Currently normotensive and on the lower side as BP was 104/50. C/w Hydralazine PRN. -Resumed Home Lasix   Normocytic Anemia -Patient's Hgb/Hct is stable at 10.6/35.1 -Checked Anemia Panel and iron level was 21, U IBC of 202, TIBC was 223, saturation ratios of 9%, ferritin of 43, folate of 16.3, vitamin B12 of 2991 -Continue to Monitor for S/Sx of Bleeding; Currently no S/Sx of Bleeding  -Repeat CBC in AM   Nausea -Added IV Zofran -Continue to Monitor but She ate all her Breakfast and was eating when I walked in -Nausea has improved   Metabolic alkalosis, improved  -In the setting of Lasix administration- -Patient CO2 is now 32 and chloride is 96 -Continue monitor and trend and repeat CMP in a.m.  DVT prophylaxis: Enoxaparin 40 mg sq q24h Code Status: FULL CODE  Family Communication: No family present at bedside  Disposition Plan: SNF likely in the AM if not hypoxic and respiratory status is stable  Consultants:   None   Procedures:  LE VENOUS DUPLEX  Summary: Right: There is no evidence of deep vein thrombosis in the lower extremity. However, portions of this examination were limited- see technologist comments above. No cystic structure found in the popliteal fossa. Left: There is no evidence of deep vein thrombosis in the lower extremity. However, portions of this examination were limited- see technologist comments above. No cystic structure found in the popliteal fossa.   Antimicrobials:  Anti-infectives (From admission, onward)     Start     Dose/Rate Route Frequency Ordered Stop   11/10/18 1800  cefTRIAXone (ROCEPHIN) 1 g in sodium chloride 0.9 % 100 mL IVPB  Status:  Discontinued     1 g 200 mL/hr over 30 Minutes Intravenous Every 24 hours 11/09/18 1948 11/15/18 1122   11/09/18 1915  cefTRIAXone (ROCEPHIN) 1 g in sodium chloride 0.9 % 100 mL IVPB     1 g 200 mL/hr over 30 Minutes Intravenous  Once 11/09/18 1909 11/09/18 1953     Subjective: Seen and examined at bedside and she was hypoxic this morning requiring 2 L oxygen.  Patient states that she is just tired and weak.  No chest pain, lightheadedness or dizziness but still coughing slightly intermittently.  Continues to have some swelling so was given additional dose of IV Lasix.  Anticipate  discharge to SNF in a.m. if stable and if respiratory status is improved and she is weaned off of oxygen and etiology of her hypoxia is found.  Objective: Vitals:   11/15/18 0602 11/15/18 0844 11/15/18 0847 11/15/18 1302  BP: 113/69   (!) 151/88  Pulse: 88   71  Resp: 18   16  Temp: 98.3 F (36.8 C)   98.8 F (37.1 C)  TempSrc: Oral   Oral  SpO2: 93% (!) 84% 97% 99%  Weight:      Height:        Intake/Output Summary (Last 24 hours) at 11/15/2018 1801 Last data filed at 11/15/2018 1655 Gross per 24 hour  Intake 960 ml  Output 2150 ml  Net -1190 ml   Filed Weights   11/09/18 1020  Weight: (!) 157.4 kg   Examination: Physical Exam:  Constitutional: WN/WD super morbidly obese AAF in NAD and appears calm but a little uncomfortable Eyes: Lids and conjunctivae normal, sclerae anicteric  ENMT: External Ears, Nose appear normal. Grossly normal hearing. Mucous membranes are moist. Neck: Appears normal, supple, no cervical masses, normal ROM, no appreciable thyromegaly; Difficult to assess JVD due to body habitus Respiratory: Diminished to auscultation bilaterally, no wheezing, rales, rhonchi or crackles. Normal respiratory effort and patient is not tachypenic. No  accessory muscle use. Unlabored breathing but wearing supplemental O2 via [place] Cardiovascular: RRR, no murmurs / rubs / gallops. S1 and S2 auscultated. Has LE Lymphedema extremity edema. 2+ pedal pulses Abdomen: Soft, non-tender, Distended due to body habitus. No masses palpated. No appreciable hepatosplenomegaly. Bowel sounds positive x4.  GU: Deferred. Musculoskeletal: No clubbing / cyanosis of digits/nails. No joint deformity upper and lower extremities. G Skin: Has a Right Leg Ulcer. No induration; Warm and dry.  Neurologic: CN 2-12 grossly intact with no focal deficits. Romberg sign cerebellar reflexes not assessed.  Psychiatric: Normal judgment and insight. Alert and oriented x 3. Mildly anxious mood and appropriate affect.   Data Reviewed: I have personally reviewed following labs and imaging studies  CBC: Recent Labs  Lab 11/11/18 0241 11/12/18 0332 11/13/18 0309 11/14/18 0759 11/15/18 0328  WBC 5.8 5.4 5.3 4.1 4.9  NEUTROABS 3.1 3.1 2.8 2.0 2.4  HGB 9.6* 9.7* 10.9* 10.6* 10.6*  HCT 32.3* 32.2* 36.7 35.4* 35.1*  MCV 96.7 96.1 96.3 95.9 96.7  PLT 189 178 197 168 889   Basic Metabolic Panel: Recent Labs  Lab 11/11/18 0241 11/12/18 0332 11/13/18 0309 11/14/18 0759 11/15/18 0328  NA 140 141 139 139 138  K 3.6 3.5 3.8 3.1* 4.5  CL 103 101 97* 95* 96*  CO2 28 31 30  34* 32  GLUCOSE 93 108* 133* 96 107*  BUN 24* 17 15 16  24*  CREATININE 1.39* 1.13* 1.16* 1.27* 1.54*  CALCIUM 8.2* 8.1* 8.2* 8.7* 8.8*  MG 2.2 2.1 2.2 2.1 2.2  PHOS 4.0 3.5 3.4 4.3 5.1*   GFR: Estimated Creatinine Clearance: 52.9 mL/min (A) (by C-G formula based on SCr of 1.54 mg/dL (H)). Liver Function Tests: Recent Labs  Lab 11/11/18 0241 11/12/18 0332 11/13/18 0309 11/14/18 0759 11/15/18 0328  AST 27 25 23 19 21   ALT 16 16 14 14 15   ALKPHOS 67 66 70 71 75  BILITOT 0.5 0.6 1.0 0.4 0.3  PROT 5.3* 5.3* 5.6* 5.9* 6.1*  ALBUMIN 2.5* 2.5* 2.7* 2.7* 2.8*   No results for input(s): LIPASE, AMYLASE  in the last 168 hours. No results for input(s): AMMONIA in the last 168 hours. Coagulation Profile:  No results for input(s): INR, PROTIME in the last 168 hours. Cardiac Enzymes: No results for input(s): CKTOTAL, CKMB, CKMBINDEX, TROPONINI in the last 168 hours. BNP (last 3 results) No results for input(s): PROBNP in the last 8760 hours. HbA1C: No results for input(s): HGBA1C in the last 72 hours. CBG: Recent Labs  Lab 11/14/18 1625 11/14/18 2002 11/15/18 0732 11/15/18 1201 11/15/18 1649  GLUCAP 134* 99 92 99 121*   Lipid Profile: No results for input(s): CHOL, HDL, LDLCALC, TRIG, CHOLHDL, LDLDIRECT in the last 72 hours. Thyroid Function Tests: No results for input(s): TSH, T4TOTAL, FREET4, T3FREE, THYROIDAB in the last 72 hours. Anemia Panel: No results for input(s): VITAMINB12, FOLATE, FERRITIN, TIBC, IRON, RETICCTPCT in the last 72 hours. Sepsis Labs: No results for input(s): PROCALCITON, LATICACIDVEN in the last 168 hours.  Recent Results (from the past 240 hour(s))  Culture, Urine     Status: Abnormal   Collection Time: 11/09/18  6:29 PM   Specimen: Urine, Random  Result Value Ref Range Status   Specimen Description   Final    URINE, RANDOM Performed at [facility] [address], [place], [place] [address]    Special Requests   Final    NONE Performed at [facility], [place] [address], [place], [place] [address]    Culture >=100,000 COLONIES/mL KLEBSIELLA PNEUMONIAE (A)  Final   Report Status 11/11/2018 FINAL  Final   Organism ID, Bacteria KLEBSIELLA PNEUMONIAE (A)  Final      Susceptibility   Klebsiella pneumoniae - MIC*    AMPICILLIN RESISTANT Resistant     CEFAZOLIN <=4 SENSITIVE Sensitive     CEFTRIAXONE <=1 SENSITIVE Sensitive     CIPROFLOXACIN <=0.25 SENSITIVE Sensitive     GENTAMICIN <=1 SENSITIVE Sensitive     IMIPENEM <=0.25 SENSITIVE Sensitive     NITROFURANTOIN 128 RESISTANT Resistant     TRIMETH/SULFA <=20  SENSITIVE Sensitive     AMPICILLIN/SULBACTAM 4 SENSITIVE Sensitive     PIP/TAZO <=4 SENSITIVE Sensitive     Extended ESBL NEGATIVE Sensitive     * >=100,000 COLONIES/mL KLEBSIELLA PNEUMONIAE  SARS CORONAVIRUS 2 (TAT 6-24 HRS) Nasopharyngeal Nasopharyngeal Swab     Status: None   Collection Time: 11/09/18  7:25 PM   Specimen: Nasopharyngeal Swab  Result Value Ref Range Status   SARS Coronavirus 2 NEGATIVE NEGATIVE Final    Comment: (NOTE) SARS-CoV-2 target nucleic acids are NOT DETECTED. The SARS-CoV-2 RNA is generally detectable in upper and lower respiratory specimens during the acute phase of infection. Negative results do not preclude SARS-CoV-2 infection, do not rule out co-infections with other pathogens, and should not be used as the sole basis for treatment or other patient management decisions. Negative results must be combined with clinical observations, patient history, and epidemiological information. The expected result is Negative. Fact Sheet for Patients: SugarRoll.be Fact Sheet for Healthcare Providers: https://www.woods-mathews.com/ This test is not yet approved or cleared by the [place] FDA and  has been authorized for detection and/or diagnosis of SARS-CoV-2 by FDA under an Emergency Use Authorization (EUA). This EUA will remain  in effect (meaning this test can be used) for the duration of the COVID-19 declaration under Section 56 4(b)(1) of the Act, 21 U.S.C. section 360bbb-3(b)(1), unless the authorization is terminated or revoked sooner. Performed at [facility], [place] [address], [place], [place] [address]   SARS CORONAVIRUS 2 (TAT 6-24 HRS) Nasopharyngeal Nasopharyngeal Swab     Status: None   Collection Time:  11/14/18  3:25 PM   Specimen: Nasopharyngeal Swab  Result Value Ref Range Status   SARS Coronavirus 2 NEGATIVE NEGATIVE Final    Comment: (NOTE) SARS-CoV-2 target nucleic acids are NOT  DETECTED. The SARS-CoV-2 RNA is generally detectable in upper and lower respiratory specimens during the acute phase of infection. Negative results do not preclude SARS-CoV-2 infection, do not rule out co-infections with other pathogens, and should not be used as the sole basis for treatment or other patient management decisions. Negative results must be combined with clinical observations, patient history, and epidemiological information. The expected result is Negative. Fact Sheet for Patients: SugarRoll.be Fact Sheet for Healthcare Providers: https://www.woods-mathews.com/ This test is not yet approved or cleared by the [place] FDA and  has been authorized for detection and/or diagnosis of SARS-CoV-2 by FDA under an Emergency Use Authorization (EUA). This EUA will remain  in effect (meaning this test can be used) for the duration of the COVID-19 declaration under Section 56 4(b)(1) of the Act, 21 U.S.C. section 360bbb-3(b)(1), unless the authorization is terminated or revoked sooner. Performed at [facility], [place] [address] [place], [place] [address]     Radiology Studies: Dg Chest Port 1 View  Result Date: 11/15/2018 CLINICAL DATA:  Lower extremity venous hypertension and ulceration. EXAM: PORTABLE CHEST 1 VIEW COMPARISON:  11/14/2018 FINDINGS: Mild cardiomegaly. Chronic aortic atherosclerosis. Mild pulmonary venous hypertension without frank edema. No infiltrate, collapse or visible effusion. IMPRESSION: Mild cardiomegaly. Aortic atherosclerosis. Pulmonary venous hypertension without edema. Electronically Signed   By: Nelson Chimes M.D.   On: 11/15/2018 16:27   Dg Chest Port 1 View  Result Date: 11/14/2018 CLINICAL DATA:  68 year old female with shortness of breath. EXAM: PORTABLE CHEST 1 VIEW COMPARISON:  Chest radiograph dated 11/13/2018 FINDINGS: No focal consolidation, pleural effusion, pneumothorax. Stable mild  cardiomegaly. There is mild vascular congestion. Atherosclerotic calcification of the aortic arch. No acute osseous pathology. IMPRESSION: Stable mild cardiomegaly with mild vascular congestion. No focal consolidation. Electronically Signed   By: Anner Crete M.D.   On: 11/14/2018 19:19   Scheduled Meds:  allopurinol  100 mg Oral Daily   aspirin EC  81 mg Oral QHS   carvedilol  25 mg Oral Daily   Chlorhexidine Gluconate Cloth  6 each Topical Daily   cholecalciferol  1,000 Units Oral Daily   colchicine  0.6 mg Oral Daily   diclofenac sodium  2 g Topical QID   enoxaparin (LOVENOX) injection  80 mg Subcutaneous QHS   famotidine  20 mg Oral BID   ferrous sulfate  325 mg Oral Q breakfast   folic acid  0.5 mg Oral QHS   furosemide  120 mg Oral BID   gabapentin  100 mg Oral TID   insulin aspart  0-5 Units Subcutaneous QHS   insulin aspart  0-9 Units Subcutaneous TID WC   ipratropium-albuterol  3 mL Nebulization BID   lidocaine  1 patch Transdermal Q24H   mometasone-formoterol  2 puff Inhalation BID   montelukast  10 mg Oral QHS   Oxcarbazepine  300 mg Oral BID   potassium chloride SA  20 mEq Oral TID   simvastatin  40 mg Oral QHS   Continuous Infusions:   LOS: 5 days   Kerney Elbe, DO [facility] PAGER is on AMION  If 7PM-7AM, please contact night-coverage [email or web address] Password [email or web address] 11/15/2018, 6:01 PM

## 2018-11-15 NOTE — NC FL2 (Cosign Needed)
[facility] LEVEL OF CARE SCREENING TOOL     IDENTIFICATION  Patient Name: April [name] Birthdate: 1951/01/28 Sex: female Admission Date (Current Location): 11/09/2018  [facility] [place] Number:  Herbalist and Address:  [place],  [place], [place]      Provider Number: [id number]  Attending Physician Name and Address:  Kerney Elbe, DO  Relative Name and Phone Number:       Current Level of Care: Hospital Recommended Level of Care: [place] Prior Approval Number:    Date Approved/Denied:   PASRR Number: [id number]  Discharge Plan: SNF    Current Diagnoses: Patient Active Problem List   Diagnosis Date Noted  . Chronic venous hypertension (idiopathic) with ulcer of right lower extremity (CODE) ([place]) 11/09/2018  . UTI (urinary tract infection) 11/09/2018  . Right hip pain 11/09/2018  . Achilles tendon sprain, left, initial encounter   . Osteomyelitis of ankle or foot, acute, left ([place]) 09/07/2018  . Osteomyelitis of ankle or foot, left, acute ([place]) 09/07/2018  . Acute on chronic kidney failure ([place]) 08/15/2018  . Onychomycosis 05/19/2018  . Idiopathic chronic venous hypertension of right lower extremity with ulcer and inflammation ([place]) 05/19/2018  . Traumatic wound dehiscence   . Skin lumps 01/14/2018  . Bilateral leg cramps 12/14/2017  . Left shoulder pain 08/14/2016  . Meralgia paresthetica 07/16/2016  . Chronic left-sided low back pain with left-sided sciatica 06/02/2016  . AKI (acute kidney injury) ([place]) 03/28/2016  . Whole body pain 03/20/2016  . Diabetic peripheral neuropathy ([place]) 03/20/2016  . Osteopenia 01/11/2016  . CKD (chronic kidney disease) stage 3, GFR 30-59 ml/min ([facility]) 01/02/2016  . Hand paresthesia 07/18/2015  . Carpal tunnel syndrome 06/07/2015  . Family history of colon cancer   . Diabetes mellitus with neurological manifestations ([place]) 04/18/2015  .  Bilateral leg edema 04/11/2015  . Abnormality of gait 01/03/2015  . Hereditary and idiopathic peripheral neuropathy 01/03/2015  . GERD (gastroesophageal reflux disease) 06/17/2014  . Hx of colonic polyps 12/15/2012  . Vitamin B12 deficiency 11/03/2012  . Intrinsic asthma 03/23/2012  . Chronic diastolic heart failure ([place]) 02/20/2011  . OSA (obstructive sleep apnea) 09/17/2010  . URINARY URGENCY 01/08/2010  . CAD, NATIVE VESSEL 11/20/2008  . Osteoarthritis of knees, bilateral 06/14/2008  . Hyperlipidemia 05/10/2007  . Essential hypertension 01/18/2007  . Hypokalemia 04/30/2006  . Morbid (severe) obesity due to excess calories ([place]) 04/30/2006  . HX, PERSONAL, PEPTIC ULCER DISEASE 04/30/2006    Orientation RESPIRATION BLADDER Height & Weight     Self, Time, Situation, Place  O2 Continent, External catheter Weight: (!) 347 lb (157.4 kg) Height:  5\' 4"  (162.6 cm)  BEHAVIORAL SYMPTOMS/MOOD NEUROLOGICAL BOWEL NUTRITION STATUS      Continent Diet(Carb Modified)  AMBULATORY STATUS COMMUNICATION OF NEEDS Skin   Extensive Assist Verbally (Right Leg wound)                       Personal Care Assistance Level of Assistance  Bathing, Feeding, Dressing Bathing Assistance: Maximum assistance Feeding assistance: Independent Dressing Assistance: Maximum assistance     Functional Limitations Info  Sight, Speech, Hearing Sight Info: Adequate Hearing Info: Adequate Speech Info: Adequate    SPECIAL CARE FACTORS FREQUENCY  PT (By licensed PT), OT (By licensed OT)     PT Frequency: 5x/week OT Frequency: 5x/week            Contractures Contractures Info: Not present  Additional Factors Info  Code Status, Allergies Code Status Info: Fullcode Allergies Info: Allergies: Penicillins, Shellfish Allergy, Sulfonamide Derivatives, Hydrochlorothiazide W-triamterene, Lotensin Benazepril Hcl, Dipyridamole, Estrogens, Hydrochlorothiazide, Metronidazole, Other, Spironolactone, Sulfa  Antibiotics, Torsemide, Valsartan, Mustard Allyl Isothiocyanate           Current Medications (11/15/2018):  This is the current hospital active medication list Current Facility-Administered Medications  Medication Dose Route Frequency Provider Last Rate Last Dose  . acetaminophen (TYLENOL) tablet 650 mg  650 mg Oral Q6H PRN Shela Leff, MD   650 mg at 11/14/18 2340   Or  . acetaminophen (TYLENOL) suppository 650 mg  650 mg Rectal Q6H PRN Shela Leff, MD      . albuterol (PROVENTIL) (2.5 MG/3ML) 0.083% nebulizer solution 2.5 mg  2.5 mg Nebulization Q4H PRN Shela Leff, MD      . allopurinol (ZYLOPRIM) tablet 100 mg  100 mg Oral Daily Sheikh, Georgina Quint Latif, DO   100 mg at 11/14/18 1300  . aspirin EC tablet 81 mg  81 mg Oral QHS Raiford Noble [place], DO   81 mg at 11/14/18 2126  . calcium carbonate (TUMS - dosed in mg elemental calcium) chewable tablet 400 mg of elemental calcium  2 tablet Oral Daily PRN Alfredia Ferguson, Georgina Quint Latif, DO      . carvedilol (COREG) tablet 25 mg  25 mg Oral Daily Raiford Noble [place], DO   25 mg at 11/14/18 6010  . cefTRIAXone (ROCEPHIN) 1 g in sodium chloride 0.9 % 100 mL IVPB  1 g Intravenous Q24H Shela Leff, MD   Stopped at 11/13/18 1759  . Chlorhexidine Gluconate Cloth 2 % PADS 6 each  6 each Topical Daily Shela Leff, MD   6 each at 11/13/18 [id number]  . cholecalciferol (VITAMIN D3) tablet 1,000 Units  1,000 Units Oral Daily Raiford Noble [place], [place]   1,000 Units at 11/14/18 5573  . colchicine tablet 0.6 mg  0.6 mg Oral Daily Raiford Noble [place], DO   0.6 mg at 11/14/18 2202  . diclofenac sodium (VOLTAREN) 1 % transdermal gel 2 g  2 g Topical QID Sheikh, [place]ma[place], DO   2 g at 11/14/18 2129  . enoxaparin (LOVENOX) injection 80 mg  80 mg Subcutaneous QHS Minda Ditto, RPH   80 mg at 11/14/18 2139  . famotidine (PEPCID) tablet 20 mg  20 mg Oral BID Raiford Noble [place], DO   20 mg at 11/14/18 2138  . ferrous sulfate tablet 325 mg  325 mg Oral  Q breakfast Irene Pap N, DO   325 mg at 11/15/18 0751  . folic acid (FOLVITE) tablet 0.5 mg  0.5 mg Oral QHS Sheikh, Georgina Quint [place], DO   0.5 mg at 11/14/18 2139  . furosemide (LASIX) injection 40 mg  40 mg Intravenous Once Sheikh, Georgina Quint Latif, DO      . furosemide (LASIX) tablet 120 mg  120 mg Oral BID Raiford Noble [place], DO   120 mg at 11/15/18 0751  . gabapentin (NEURONTIN) capsule 100 mg  100 mg Oral TID Raiford Noble [place], DO   100 mg at 11/14/18 2126  . hydrALAZINE (APRESOLINE) injection 5 mg  5 mg Intravenous Q4H PRN Shela Leff, MD      . hydrOXYzine (ATARAX/VISTARIL) tablet 25 mg  25 mg Oral Q6H PRN Raiford Noble [place], DO   25 mg at 11/14/18 2113  . insulin aspart (novoLOG) injection 0-5 Units  0-5 Units Subcutaneous QHS Shela Leff, MD   Stopped at 11/09/18 2156  . insulin  aspart (novoLOG) injection 0-9 Units  0-9 Units Subcutaneous TID WC Shela Leff, MD   1 Units at 11/14/18 1738  . ipratropium-albuterol (DUONEB) 0.5-2.5 (3) MG/3ML nebulizer solution 3 mL  3 mL Nebulization BID Shela Leff, MD   3 mL at 11/15/18 0843  . lidocaine (LIDODERM) 5 % 1 patch  1 patch Transdermal Q24H Raiford Noble [place], DO   1 patch at 11/14/18 1300  . mometasone-formoterol (DULERA) 200-5 MCG/ACT inhaler 2 puff  2 puff Inhalation BID Raiford Noble [place], DO   2 puff at 11/15/18 0844  . montelukast (SINGULAIR) tablet 10 mg  10 mg Oral QHS Raiford Noble [place], DO   10 mg at 11/14/18 2126  . ondansetron (ZOFRAN) injection 4 mg  4 mg Intravenous Q6H PRN Raiford Noble [place], DO   4 mg at 11/14/18 1617  . Oxcarbazepine (TRILEPTAL) tablet 300 mg  300 mg Oral BID Raiford Noble [place], DO   300 mg at 11/14/18 2140  . oxyCODONE-acetaminophen (PERCOCET/ROXICET) 5-325 MG per tablet 1 tablet  1 tablet Oral Q8H PRN Raiford Noble [place], DO   1 tablet at 11/14/18 1617  . phenol (CHLORASEPTIC) mouth spray 1 spray  1 spray Mouth/Throat PRN Raiford Noble [place], DO   1 spray at 11/15/18 2244  .  potassium chloride SA (KLOR-CON) CR tablet 20 mEq  20 mEq Oral TID Raiford Noble [place], DO   20 mEq at 11/14/18 2126  . simvastatin (ZOCOR) tablet 40 mg  40 mg Oral QHS Raiford Noble [place], DO   40 mg at 11/14/18 2126  . traMADol (ULTRAM) tablet 50 mg  50 mg Oral Q6H PRN Shela Leff, MD   50 mg at 11/15/18 0345     Discharge Medications: Please see discharge summary for a list of discharge medications.  Relevant Imaging Results:  Relevant Lab Results:   Additional Information [id number]  Lia Hopping, LCSW

## 2018-11-15 NOTE — Progress Notes (Signed)
PROGRESS NOTE    April [name]  GDJ:[id number] DOB: 1950/10/06 DOA: 11/09/2018 PCP: [name], MD   Brief Narrative:  HPI per Dr. Shela Leff on 11/09/2018 April [name] is a 68 y.o. female with medical history significant of idiopathic chronic venous hypertension of the right lower extremity with ulceration status post debridement and skin grafting in April 2020, chronic lymphedema, anemia, asthma, CAD, CHF, CKD, diet-controlled type 2 diabetes, GERD, hypertension, hyperlipidemia, morbid obesity, neuropathy, PUD, sleep apnea presenting to the hospital for evaluation of right lower extremity pain secondary to a chronic wound.  Patient states she has had this wound on her right lower extremity since January and has had multiple surgeries done.  States this is causing her pain and her PCP has referred her to a pain clinic.  It has been difficult for her to transfer even from a chair due to this pain.  She is also having pain in her right hip for about a week now.  She did not fall.  Denies any fevers.  She has been having dysuria for the past few days.  No other complaints.  ED Course: Afebrile and hemodynamically stable.  No leukocytosis.  Potassium 3.4.  BUN 34, creatinine 1.9.  Baseline creatinine 1.4-1.5.  BNP normal.  UA with large amount of leukocytes, greater than 50 WBCs, and many bacteria on microscopic examination.  SARS-CoV-2 test pending.  Chest x-ray showing mild prominence of the bilateral perihilar interstitial markings, likely attenuated by low lung volumes.  No focal consolidation.  Patient received ceftriaxone.  **Interim History  Still complaining of some right leg pain she states that she is trying to exercise it and today she complained of "cold toes."  PT recommending SNF and social worker consulted for assistance with placement.  Urine grew out Klebsiella pneumoniae which was only resistant to ampicillin and Macrobid.  We will continue IV ceftriaxone for now and  treat for at least 5 days total and transition to p.o. antibiotics at discharge.  Because of her continued lower extremity complaints we will obtain a vascular ABI as well as a lower extremity duplex to rule out DVT.  Lower Extremity venous duplex was negative for DVT and ABIs are still pending to be done however cannot be done due to ulcer location and will have them done in the outpatient setting as she has good pulses.   Patient was to be discharged today to SNF however she is noted to be hypoxic on room air at 84% and complaining of weakness and chest x-ray was ordered but never done this morning and it came back later this afternoon and showed "Mild cardiomegaly. Aortic atherosclerosis. Pulmonary venous hypertension without edema."  Given 1 additional dose of IV Lasix and an incentive spirometer as well as a flutter valve.  Likely she has obesity hypoventilation syndrome.  Likely will be discharged in the a.m. if respiratory status stays stable  Assessment & Plan:   Principal Problem:   Chronic venous hypertension (idiopathic) with ulcer of right lower extremity (CODE) (HCC) Active Problems:   Hypokalemia   AKI (acute kidney injury) ([place])   UTI (urinary tract infection)   Right hip pain  Idiopathic chronic venous hypertension of the right lower extremity with ulceration -Status post debridement and skin grafting in April 2020.   -Followed by Dr. [name].   -Ulcer does not appear infected at present.  No fever or leukocytosis.   -Patient is having problems with mobility secondary to this wound and is interested  in SNF placement. -Tramadol PRN, Tylenol PRN pain; resume her home oxycodone dose as well as gabapentin patient still complaining of significant pain though nursing states that she appears fairly comfortable -Wound care consult -We will check a vascular ABI as well as a lower extremity venous duplex given her complaints of numbness and cold toes however she has good palpable pulses on  examination -Venous duplex showed no evidence of any DVT upper portions of the examination were limited due to body habitus and poor ultrasound and tissue in her but did not show any cystic structures in the popliteal fossa -Vascular ABIs were still pending to be done but will cancel order and defer to outpatient due to ulcer location and because the patient has excellent pulses -PT evaluation recommending skilled nursing facility -Social work consult for SNF placement and anticipate D/C to SNF in AM as they require repeat COVID Testing  -Elevate extremities -Out of bed with assistance -Ambulate if possible  Klebsiella Pneumoniae UTI -Afebrile no leukocytosis.  No signs of sepsis.  Did have a slight temperature of 100.1 yesterday but T-max is now 98.9 today -Patient endorses dysuria.  -UA done and showed turbid appearance with small hemoglobin, large leukocytes, many bacteria, 6-10 RBCs per high-power field, greater than 50 WBCs with urine culture growing greater than 100,000 colony-forming units of Klebsiella -Does have penicillin allergy listed.   -Received ceftriaxone in the ED and has tolerated it well.     -Will continue IV ceftriaxone for now changed to p.o. antibiotics at discharge -Completed 5 days of antibiotics total -Continue to Monitor   Right hip pain along with right leg and foot pain -Patient is complaining of pain in her right hip which is limiting her mobility. -Right hip x-ray Unrevealing and will obtain Right Knee and Right Foot X-Ray -Right knee and right foot x-ray showed no acute osseous abnormality but the right knee did show advanced tricompartment arthritis with a small effusion -Tramadol PRN, Tylenol PRN pain; Resumed Home Oxycodone and Gabapentin -Check vitamin B12 and was 2991, TSH as well and B1 level -We will need outpatient nerve conduction studies -If Patient continues to have leg pain may need further imaging with a CT scan versus MRI but thinks her pain is  getting a little bit better -Vascular ABI cancelled  -LE Duplex showed no DVT -Will add Lidocaine Patch and Diclofenac Gel as it helped the patient -Recommend out of bed to chair  Mild Hypokalemia -Potassium 3.4 on admission and improved to 3.8 and now was 3.1 -Resume Home po KCl with 20 mEQ TID and give an extra 40 mEQ this AM -In the setting of Lasix -Continue to monitor replete as necessary -Repeat CMP in AM   AKI on CKD stage III, improving  -BUN 34, creatinine 1.9.  Baseline creatinine 1.4-1.5.  Possibly prerenal due to dehydration. -BUN/Cr had improved to 16/1.27 but today it worsened to 24/1.54 -Resumed Home Lasix at 120 mg p.o. BID given additional dose of IV Lasix today -Continue to monitor renal function -Monitor urine output -Repeat CMP n AM   History of asthma Mild Acute Respiratory Failure with Hypoxia, improved initially but now again decompensated and was found to be hypoxic on room -Has very mild mild scattered end expiratory wheezing on exam previously but none heard today on examination.   -No increased work of breathing, tachypnea remains on 2 L this morning and will have asked the nurse to wean and she is on room air now -Combivent every 6 hours -  C/w Dulera and Montelukast -Albuterol MDI every 2 hours as needed -Repeat CXR yesterday AM showed "Mild cardiomegaly. Aortic atherosclerosis. Pulmonary venous hypertension without edema. -Wean O2 as Tolerated and now off of O2 -We will need home Ambulatory screen prior to discharge if she is able to walk and will order  -If continues to be hypoxic and may need a CTA scan of her chest to r/o PE but LE venous Duplex was Negative  -She did say that she is coughing up some green sputum now but is not very significant and is intermittent and did not have any today -We will consider a CTA of the chest to rule out PE and pulmonary consultation if hypoxia persists  Mild Acute on Chronic Diastolic CHF -Stable.   -Has  significant bilateral lower extremity edema secondary to lymphedema. -BNP normal but not accurate 2/2 Obesity and Body Habitus.   -Chest x-ray this AM showed "Mild cardiomegaly. Aortic atherosclerosis. Pulmonary venous hypertension without edema. -Resume Home Lasix Dosing and gave an Extra IV Dose 40 mg this AM  -Strict I's/O's and Daily Weights; She is -5,358 L since admission -Repeat CXR in AM   Diet controlled type 2 diabetes -A1c 6.0 on 09/08/2018. -Sliding scale insulin sensitive and CBG checks -CBG's ranging from 92-121  Hypertension -Currently normotensive and on the lower side as BP was 104/50. C/w Hydralazine PRN. -Resumed Home Lasix   Normocytic Anemia -Patient's Hgb/Hct is stable at 10.6/35.1 -Checked Anemia Panel and iron level was 21, U IBC of 202, TIBC was 223, saturation ratios of 9%, ferritin of 43, folate of 16.3, vitamin B12 of 2991 -Continue to Monitor for S/Sx of Bleeding; Currently no S/Sx of Bleeding  -Repeat CBC in AM   Nausea -Added IV Zofran -Continue to Monitor but She ate all her Breakfast and was eating when I walked in -Nausea has improved   Metabolic alkalosis, improved  -In the setting of Lasix administration- -Patient CO2 is now 32 and chloride is 96 -Continue monitor and trend and repeat CMP in a.m.  DVT prophylaxis: Enoxaparin 40 mg sq q24h Code Status: FULL CODE  Family Communication: No family present at bedside  Disposition Plan: SNF likely in the AM if not hypoxic and respiratory status is stable  Consultants:   None   Procedures:  LE VENOUS DUPLEX  Summary: Right: There is no evidence of deep vein thrombosis in the lower extremity. However, portions of this examination were limited- see technologist comments above. No cystic structure found in the popliteal fossa. Left: There is no evidence of deep vein thrombosis in the lower extremity. However, portions of this examination were limited- see technologist comments above. No cystic  structure found in the popliteal fossa.   Antimicrobials:  Anti-infectives (From admission, onward)   Start     Dose/Rate Route Frequency Ordered Stop   11/10/18 1800  cefTRIAXone (ROCEPHIN) 1 g in sodium chloride 0.9 % 100 mL IVPB  Status:  Discontinued     1 g 200 mL/hr over 30 Minutes Intravenous Every 24 hours 11/09/18 1948 11/15/18 1122   11/09/18 1915  cefTRIAXone (ROCEPHIN) 1 g in sodium chloride 0.9 % 100 mL IVPB     1 g 200 mL/hr over 30 Minutes Intravenous  Once 11/09/18 1909 11/09/18 1953     Subjective: Seen and examined at bedside and she was hypoxic this morning requiring 2 L oxygen.  Patient states that she is just tired and weak.  No chest pain, lightheadedness or dizziness but still coughing slightly  intermittently.  Continues to have some swelling so was given additional dose of IV Lasix.  Anticipate discharge to SNF in a.m. if stable and if respiratory status is improved and she is weaned off of oxygen and etiology of her hypoxia is found.  Objective: Vitals:   11/15/18 0602 11/15/18 0844 11/15/18 0847 11/15/18 1302  BP: 113/69   (!) 151/88  Pulse: 88   71  Resp: 18   16  Temp: 98.3 F (36.8 C)   98.8 F (37.1 C)  TempSrc: Oral   Oral  SpO2: 93% (!) 84% 97% 99%  Weight:      Height:        Intake/Output Summary (Last 24 hours) at 11/15/2018 1850 Last data filed at 11/15/2018 1655 Gross per 24 hour  Intake 960 ml  Output 2150 ml  Net -1190 ml   Filed Weights   11/09/18 1020  Weight: (!) 157.4 kg   Examination: Physical Exam:  Constitutional: WN/WD super morbidly obese AAF in NAD and appears calm but a little uncomfortable Eyes: Lids and conjunctivae normal, sclerae anicteric  ENMT: External Ears, Nose appear normal. Grossly normal hearing. Mucous membranes are moist. Neck: Appears normal, supple, no cervical masses, normal ROM, no appreciable thyromegaly; Difficult to assess JVD due to body habitus Respiratory: Diminished to auscultation  bilaterally, no wheezing, rales, rhonchi or crackles. Normal respiratory effort and patient is not tachypenic. No accessory muscle use. Unlabored breathing but wearing supplemental O2 via [place] Cardiovascular: RRR, no murmurs / rubs / gallops. S1 and S2 auscultated. Has LE Lymphedema extremity edema. 2+ pedal pulses Abdomen: Soft, non-tender, Distended due to body habitus. No masses palpated. No appreciable hepatosplenomegaly. Bowel sounds positive x4.  GU: Deferred. Musculoskeletal: No clubbing / cyanosis of digits/nails. No joint deformity upper and lower extremities. G Skin: Has a Right Leg Ulcer. No induration; Warm and dry.  Neurologic: CN 2-12 grossly intact with no focal deficits. Romberg sign cerebellar reflexes not assessed.  Psychiatric: Normal judgment and insight. Alert and oriented x 3. Mildly anxious mood and appropriate affect.   Data Reviewed: I have personally reviewed following labs and imaging studies  CBC: Recent Labs  Lab 11/11/18 0241 11/12/18 0332 11/13/18 0309 11/14/18 0759 11/15/18 0328  WBC 5.8 5.4 5.3 4.1 4.9  NEUTROABS 3.1 3.1 2.8 2.0 2.4  HGB 9.6* 9.7* 10.9* 10.6* 10.6*  HCT 32.3* 32.2* 36.7 35.4* 35.1*  MCV 96.7 96.1 96.3 95.9 96.7  PLT 189 178 197 168 366   Basic Metabolic Panel: Recent Labs  Lab 11/11/18 0241 11/12/18 0332 11/13/18 0309 11/14/18 0759 11/15/18 0328  NA 140 141 139 139 138  K 3.6 3.5 3.8 3.1* 4.5  CL 103 101 97* 95* 96*  CO2 28 31 30  34* 32  GLUCOSE 93 108* 133* 96 107*  BUN 24* 17 15 16  24*  CREATININE 1.39* 1.13* 1.16* 1.27* 1.54*  CALCIUM 8.2* 8.1* 8.2* 8.7* 8.8*  MG 2.2 2.1 2.2 2.1 2.2  PHOS 4.0 3.5 3.4 4.3 5.1*   GFR: Estimated Creatinine Clearance: 52.9 mL/min (A) (by C-G formula based on SCr of 1.54 mg/dL (H)). Liver Function Tests: Recent Labs  Lab 11/11/18 0241 11/12/18 0332 11/13/18 0309 11/14/18 0759 11/15/18 0328  AST 27 25 23 19 21   ALT 16 16 14 14 15   ALKPHOS 67 66 70 71 75  BILITOT 0.5 0.6 1.0 0.4 0.3   PROT 5.3* 5.3* 5.6* 5.9* 6.1*  ALBUMIN 2.5* 2.5* 2.7* 2.7* 2.8*   No results for input(s): LIPASE, AMYLASE  in the last 168 hours. No results for input(s): AMMONIA in the last 168 hours. Coagulation Profile: No results for input(s): INR, PROTIME in the last 168 hours. Cardiac Enzymes: No results for input(s): CKTOTAL, CKMB, CKMBINDEX, TROPONINI in the last 168 hours. BNP (last 3 results) No results for input(s): PROBNP in the last 8760 hours. HbA1C: No results for input(s): HGBA1C in the last 72 hours. CBG: Recent Labs  Lab 11/14/18 1625 11/14/18 2002 11/15/18 0732 11/15/18 1201 11/15/18 1649  GLUCAP 134* 99 92 99 121*   Lipid Profile: No results for input(s): CHOL, HDL, LDLCALC, TRIG, CHOLHDL, LDLDIRECT in the last 72 hours. Thyroid Function Tests: No results for input(s): TSH, T4TOTAL, FREET4, T3FREE, THYROIDAB in the last 72 hours. Anemia Panel: No results for input(s): VITAMINB12, FOLATE, FERRITIN, TIBC, IRON, RETICCTPCT in the last 72 hours. Sepsis Labs: No results for input(s): PROCALCITON, LATICACIDVEN in the last 168 hours.  Recent Results (from the past 240 hour(s))  Culture, Urine     Status: Abnormal   Collection Time: 11/09/18  6:29 PM   Specimen: Urine, Random  Result Value Ref Range Status   Specimen Description   Final    URINE, RANDOM Performed at [facility] [address] [place], [place] [address]    Special Requests   Final    NONE Performed at [facility], [place] [address] [place], [place] [address]    Culture >=100,000 COLONIES/mL KLEBSIELLA PNEUMONIAE (A)  Final   Report Status 11/11/2018 FINAL  Final   Organism ID, Bacteria KLEBSIELLA PNEUMONIAE (A)  Final      Susceptibility   Klebsiella pneumoniae - MIC*    AMPICILLIN RESISTANT Resistant     CEFAZOLIN <=4 SENSITIVE Sensitive     CEFTRIAXONE <=1 SENSITIVE Sensitive     CIPROFLOXACIN <=0.25 SENSITIVE Sensitive     GENTAMICIN <=1 SENSITIVE Sensitive      IMIPENEM <=0.25 SENSITIVE Sensitive     NITROFURANTOIN 128 RESISTANT Resistant     TRIMETH/SULFA <=20 SENSITIVE Sensitive     AMPICILLIN/SULBACTAM 4 SENSITIVE Sensitive     PIP/TAZO <=4 SENSITIVE Sensitive     Extended ESBL NEGATIVE Sensitive     * >=100,000 COLONIES/mL KLEBSIELLA PNEUMONIAE  SARS CORONAVIRUS 2 (TAT 6-24 HRS) Nasopharyngeal Nasopharyngeal Swab     Status: None   Collection Time: 11/09/18  7:25 PM   Specimen: Nasopharyngeal Swab  Result Value Ref Range Status   SARS Coronavirus 2 NEGATIVE NEGATIVE Final    Comment: (NOTE) SARS-CoV-2 target nucleic acids are NOT DETECTED. The SARS-CoV-2 RNA is generally detectable in upper and lower respiratory specimens during the acute phase of infection. Negative results do not preclude SARS-CoV-2 infection, do not rule out co-infections with other pathogens, and should not be used as the sole basis for treatment or other patient management decisions. Negative results must be combined with clinical observations, patient history, and epidemiological information. The expected result is Negative. Fact Sheet for Patients: SugarRoll.be Fact Sheet for Healthcare Providers: https://www.woods-mathews.com/ This test is not yet approved or cleared by the [place] FDA and  has been authorized for detection and/or diagnosis of SARS-CoV-2 by FDA under an Emergency Use Authorization (EUA). This EUA will remain  in effect (meaning this test can be used) for the duration of the COVID-19 declaration under Section 56 4(b)(1) of the Act, 21 U.S.C. section 360bbb-3(b)(1), unless the authorization is terminated or revoked sooner. Performed at [facility], [place] [address] [place], [place] [address]   SARS CORONAVIRUS  2 (TAT 6-24 HRS) Nasopharyngeal Nasopharyngeal Swab     Status: None   Collection Time: 11/14/18  3:25 PM   Specimen: Nasopharyngeal Swab  Result Value Ref Range Status   SARS  Coronavirus 2 NEGATIVE NEGATIVE Final    Comment: (NOTE) SARS-CoV-2 target nucleic acids are NOT DETECTED. The SARS-CoV-2 RNA is generally detectable in upper and lower respiratory specimens during the acute phase of infection. Negative results do not preclude SARS-CoV-2 infection, do not rule out co-infections with other pathogens, and should not be used as the sole basis for treatment or other patient management decisions. Negative results must be combined with clinical observations, patient history, and epidemiological information. The expected result is Negative. Fact Sheet for Patients: SugarRoll.be Fact Sheet for Healthcare Providers: https://www.woods-mathews.com/ This test is not yet approved or cleared by the [place] FDA and  has been authorized for detection and/or diagnosis of SARS-CoV-2 by FDA under an Emergency Use Authorization (EUA). This EUA will remain  in effect (meaning this test can be used) for the duration of the COVID-19 declaration under Section 56 4(b)(1) of the Act, 21 U.S.C. section 360bbb-3(b)(1), unless the authorization is terminated or revoked sooner. Performed at [facility], [place] [address], [place], [place] [address]     Radiology Studies: Dg Chest Port 1 View  Result Date: 11/15/2018 CLINICAL DATA:  Lower extremity venous hypertension and ulceration. EXAM: PORTABLE CHEST 1 VIEW COMPARISON:  11/14/2018 FINDINGS: Mild cardiomegaly. Chronic aortic atherosclerosis. Mild pulmonary venous hypertension without frank edema. No infiltrate, collapse or visible effusion. IMPRESSION: Mild cardiomegaly. Aortic atherosclerosis. Pulmonary venous hypertension without edema. Electronically Signed   By: Nelson Chimes M.D.   On: 11/15/2018 16:27   Dg Chest Port 1 View  Result Date: 11/14/2018 CLINICAL DATA:  68 year old female with shortness of breath. EXAM: PORTABLE CHEST 1 VIEW COMPARISON:  Chest radiograph dated  11/13/2018 FINDINGS: No focal consolidation, pleural effusion, pneumothorax. Stable mild cardiomegaly. There is mild vascular congestion. Atherosclerotic calcification of the aortic arch. No acute osseous pathology. IMPRESSION: Stable mild cardiomegaly with mild vascular congestion. No focal consolidation. Electronically Signed   By: Anner Crete M.D.   On: 11/14/2018 19:19   Scheduled Meds:  allopurinol  100 mg Oral Daily   aspirin EC  81 mg Oral QHS   carvedilol  25 mg Oral Daily   Chlorhexidine Gluconate Cloth  6 each Topical Daily   cholecalciferol  1,000 Units Oral Daily   colchicine  0.6 mg Oral Daily   diclofenac sodium  2 g Topical QID   enoxaparin (LOVENOX) injection  80 mg Subcutaneous QHS   famotidine  20 mg Oral BID   ferrous sulfate  325 mg Oral Q breakfast   folic acid  0.5 mg Oral QHS   furosemide  120 mg Oral BID   gabapentin  100 mg Oral TID   insulin aspart  0-5 Units Subcutaneous QHS   insulin aspart  0-9 Units Subcutaneous TID WC   ipratropium-albuterol  3 mL Nebulization BID   lidocaine  1 patch Transdermal Q24H   mometasone-formoterol  2 puff Inhalation BID   montelukast  10 mg Oral QHS   Oxcarbazepine  300 mg Oral BID   potassium chloride SA  20 mEq Oral TID   simvastatin  40 mg Oral QHS   Continuous Infusions:   LOS: 5 days   Kerney Elbe, DO [facility] PAGER is on AMION  If 7PM-7AM, please contact night-coverage [email or web address] Password [email or web address] 11/15/2018, 6:50 PM

## 2018-11-15 NOTE — Progress Notes (Signed)
Rt came in to do pt breathing tx. Pt found to be 84% on RA. Rt placed pt back on 2 LPM [place]. No distress notes at this time.

## 2018-11-16 ENCOUNTER — Inpatient Hospital Stay (HOSPITAL_COMMUNITY): Payer: Medicare Other

## 2018-11-16 LAB — COMPREHENSIVE METABOLIC PANEL
ALT: 17 U/L (ref 0–44)
AST: 22 U/L (ref 15–41)
Albumin: 2.8 g/dL — ABNORMAL LOW (ref 3.5–5.0)
Alkaline Phosphatase: 84 U/L (ref 38–126)
Anion gap: 9 (ref 5–15)
BUN: 28 mg/dL — ABNORMAL HIGH (ref 8–23)
CO2: 31 mmol/L (ref 22–32)
Calcium: 8.4 mg/dL — ABNORMAL LOW (ref 8.9–10.3)
Chloride: 96 mmol/L — ABNORMAL LOW (ref 98–111)
Creatinine, Ser: 1.49 mg/dL — ABNORMAL HIGH (ref 0.44–1.00)
GFR calc Af Amer: 41 mL/min — ABNORMAL LOW (ref >60)
GFR calc non Af Amer: 36 mL/min — ABNORMAL LOW (ref >60)
Glucose, Bld: 116 mg/dL — ABNORMAL HIGH (ref 70–99)
Potassium: 4.2 mmol/L (ref 3.5–5.1)
Sodium: 136 mmol/L (ref 135–145)
Total Bilirubin: 0.5 mg/dL (ref 0.3–1.2)
Total Protein: 5.9 g/dL — ABNORMAL LOW (ref 6.5–8.1)

## 2018-11-16 LAB — PHOSPHORUS: Phosphorus: 4.1 mg/dL (ref 2.5–4.6)

## 2018-11-16 LAB — CBC WITH DIFFERENTIAL/PLATELET
Abs Immature Granulocytes: 0.04 10*3/uL (ref 0.00–0.07)
Basophils Absolute: 0.1 10*3/uL (ref 0.0–0.1)
Basophils Relative: 1 %
Eosinophils Absolute: 0.3 10*3/uL (ref 0.0–0.5)
Eosinophils Relative: 5 %
HCT: 35.2 % — ABNORMAL LOW (ref 36.0–46.0)
Hemoglobin: 10.4 g/dL — ABNORMAL LOW (ref 12.0–15.0)
Immature Granulocytes: 1 %
Lymphocytes Relative: 35 %
Lymphs Abs: 1.7 10*3/uL (ref 0.7–4.0)
MCH: 28.3 pg (ref 26.0–34.0)
MCHC: 29.5 g/dL — ABNORMAL LOW (ref 30.0–36.0)
MCV: 95.9 fL (ref 80.0–100.0)
Monocytes Absolute: 0.5 10*3/uL (ref 0.1–1.0)
Monocytes Relative: 11 %
Neutro Abs: 2.3 10*3/uL (ref 1.7–7.7)
Neutrophils Relative %: 47 %
Platelets: 183 10*3/uL (ref 150–400)
RBC: 3.67 MIL/uL — ABNORMAL LOW (ref 3.87–5.11)
RDW: 14.6 % (ref 11.5–15.5)
WBC: 4.9 10*3/uL (ref 4.0–10.5)
nRBC: 0 % (ref 0.0–0.2)

## 2018-11-16 LAB — GLUCOSE, CAPILLARY
Glucose-Capillary: 87 mg/dL (ref 70–99)
Glucose-Capillary: 116 mg/dL — ABNORMAL HIGH (ref 70–99)

## 2018-11-16 LAB — MAGNESIUM: Magnesium: 2.2 mg/dL (ref 1.7–2.4)

## 2018-11-16 MED ORDER — ACETAMINOPHEN 325 MG PO TABS: 650.0000 mg | ORAL_TABLET | Freq: Four times a day (QID) | ORAL | 0 refills | Status: DC | PRN

## 2018-11-16 MED ORDER — TRAMADOL HCL 50 MG PO TABS: 50.0000 mg | ORAL_TABLET | Freq: Four times a day (QID) | ORAL | 0 refills | Status: DC | PRN

## 2018-11-16 MED ORDER — FERROUS SULFATE 325 (65 FE) MG PO TABS: 325.0000 mg | ORAL_TABLET | Freq: Every day | ORAL | 0 refills | Status: DC

## 2018-11-16 NOTE — Care Management Important Message (Signed)
Important Message  Patient Details  Name: Lasandra Batley MRN: [id number] Date of Birth: Jun 05, 1950   Medicare Important Message Given:  Yes. CMA printed out IM for the Case Management Nurse or CSW to give to the patient.      Marnette Perkins 11/16/2018, 8:14 AM

## 2018-11-16 NOTE — TOC Transition Note (Signed)
Transition of Care [facility]) - CM/SW Discharge Note   Patient Details  Name: April [name] MRN: [id number] Date of Birth: [birth year]  [facility] CM/SW Contact:  Lia Hopping, Tower Phone Number: 11/16/2018, 11:56 AM   Clinical Narrative:    [employer] Authorization received. Auth# [id number] Nurse call report to: [phone] Room 119B PTAR arranged to transport at 4:00pm   Final next level of care: [place] Barriers to Discharge: Barriers Resolved   Patient Goals and CMS Choice Patient states their goals for this hospitalization and ongoing recovery are:: "To walk well again". CMS Medicare.gov Compare Post Acute Care list provided to:: Patient Choice offered to / list presented to : Patient  Discharge Placement   Existing PASRR number confirmed : 11/16/18          Patient chooses bed at: [facility] Patient to be transferred to facility by: [place] Name of family member notified: Patient to notify her spouse Patient and family notified of of transfer: 11/16/18  Discharge Plan and Services                                     Social Determinants of Health (SDOH) Interventions     Readmission Risk Interventions No flowsheet data found.

## 2018-11-16 NOTE — Plan of Care (Signed)
Patient discharged to facility. Report given to [facility]

## 2018-11-16 NOTE — Discharge Summary (Signed)
Physician Discharge Summary  April [name] XTG:[id number] DOB: 1950/08/12 DOA: 11/09/2018  PCP: [name], MD  Admit date: 11/09/2018 Discharge date: 11/16/2018  Admitted From: Home  Disposition:  SNF   Recommendations for Outpatient Follow-up and new medication changes:  1. Follow up with Dr Quay Burow in 7 days.  2. Patient will continue pain control with tramadol 3. Started on iron supplementation.  4. Follow basic metabolic panel in 7 days to follow on electrolytes.   Home Health: na   Equipment/Devices: na    Discharge Condition: stable  CODE STATUS: full  Diet recommendation: heart healthy and diabetic prudent.   Brief/Interim Summary: 68 year old female who presented with right lower extremity pain.    She does have significant past medical history for idiopathic chronic venous hypertension, right lower extremity ulceration status post debridement and grafting April 2020, chronic lymphedema, chronic anemia, asthma, coronary artery disease, heart failure, chronic kidney disease, type 2 diabetes mellitus, GERD, hypertension, dyslipidemia, morbid obesity, neuropathy, and sleep apnea.  She reported persistent dry extremity pain, she was referred to the outpatient pain clinic but her mobility continues to deteriorate and the pain has been extending into her right hip.  Positive dysuria but no fevers.  On her initial physical examination blood pressure 135/86, heart rate 68, respiratory 16, oxygen saturation 95%, she was morbidly obese, her lungs have mild scattered and expiratory wheezing, heart S1-S2 assessment with me, abdomen protuberant, positive for lymphedema bilateral extremities, follow extremity with full strength of wound, no signs of local infection. Sodium 138, potassium 3.4, chloride 97, bicarb 29, glucose 134, BUN 34, creatinine 1.96, white count 6.7, hemoglobin 11.1, hematocrit 36.7, platelets 208.  SARS COVID-19 was negative.  Urinalysis more than 50 white cells, 6-10  red cells, specific gravity 1.012, protein 30, large leukocytes.  Her chest radiograph had no infiltrates.  EKG 87 bpm, normal axis, normal intervals, sinus rhythm, no ST segment or T wave changes, positive PVC.  Patient was admitted to the hospital with a working diagnosis of urinary tract infection complicated by worsening right lower extremity pain and decreased mobility.   1.  Urinary tract infection/present on admission, Klebsiella pneumonia.  Patient was admitted to the medical ward, she received antibiotic therapy with ceftriaxone with good toleration.  Urine culture was positive for Klebsiella, sensitive to cephalosporins.  Patient completed 5 days of antibiotics.  2.  Idiopathic chronic venous hypertension of the right lower extremity with chronic ulceration.  Patient continued to have persistent pain, she was seen by physical therapy with recommendations to continue therapy at a skilled nursing facility.  Patient received analgesics with tramadol, Tylenol and oxycodone as well as gabapentin with good response.  Further work-up with ultrasonography of lower extremity was negative for deep vein thrombosis.    She had further imaging of the right hip, right knee and right foot, negative for acute osseous injury.  Her symptoms improved, would recommend outpatient ankle-brachial index assessment.  Elevate extremities, out of bed with assistance, continue physical therapy.  3.  Acute kidney injury and chronic kidney disease stage III with hypokalemia/volume overload.  Her kidney function was monitored closely while hospitalized, required IV diuresis and she will continue oral furosemide at discharge.  Her discharge creatinine is 1.49, sodium 136, potassium 4.2, serum bicarbonate 31  4.  Acute on chronic diastolic heart failure exacerbation, with acute hypoxic respiratory failure.  Patient was noted to be hypoxic, further imaging showed pulmonary edema, she received IV furosemide with improvement of  her symptoms, at  discharge her oxygenation is 100% on 2 L per nasal cannula.  5.  Type 2 diabetes mellitus.  Patient was placed on insulin sliding scale for glucose coverage and monitoring, her capillary glucose remains well controlled.  6.  Hypertension.  Her blood pressure remained well controlled, continue diuretic therapy with furosemide.  7.  Normocytic anemia/chronic, multifactorial/ iron deficiency.  Hemoglobin and hematocrit remain stable, follow-up as an outpatient. Patient will take iron supplementation.   8.  Morbid obesity with likely obesity hypoventilation syndrome.  Patient will need outpatient follow-up, her serum bicarbonate was noted to be elevated up to 31 and 34.  Her arterial blood gas on admission showed a pH 7.37, PCO2 51.5, PO2 108, oxygen saturation 97.8 and serum bicarbonate 29.7   Discharge Diagnoses:  Principal Problem:   Chronic venous hypertension (idiopathic) with ulcer of right lower extremity (CODE) (HCC) Active Problems:   Hypokalemia   AKI (acute kidney injury) ([place])   UTI (urinary tract infection)   Right hip pain    Discharge Instructions   Allergies as of 11/16/2018      Reactions   Penicillins Rash, Other (See Comments)   She was told not to take it anymore. Has patient had a PCN reaction causing immediate rash, facial/tongue/throat swelling, SOB or lightheadedness with hypotension: Yes Has patient had a PCN reaction causing severe rash involving mucus membranes or skin necrosis: No Has patient had a PCN reaction that required hospitalization: No Has patient had a PCN reaction occurring within the last 10 years: No If all of the above answers are "NO", then may proceed with Cephalosporin use.'   Shellfish Allergy Anaphylaxis   Sulfonamide Derivatives Anaphylaxis, Other (See Comments)   REACTION: internal "burning"   Hydrochlorothiazide W-triamterene Other (See Comments)   Hypokalemia   Lotensin [benazepril Hcl] Hives   Dipyridamole Other  (See Comments)   Unknown reaction   Estrogens Other (See Comments)   Unknown reaction   Hydrochlorothiazide Other (See Comments)   Unknown reaction   Metronidazole Other (See Comments)   Unknown reaction   Other    Pickles-itching   Spironolactone Other (See Comments)   UNSPECIFIED > "kidney problems"   Sulfa Antibiotics Other (See Comments)   Unknown reaction   Torsemide Other (See Comments)   Unknown reaction   Valsartan Other (See Comments)   Unknown reaction   Mustard [allyl Isothiocyanate] Itching      Medication List    STOP taking these medications   collagenase ointment Commonly known as: SANTYL   oxyCODONE-acetaminophen 5-325 MG tablet Commonly known as: Percocet     TAKE these medications   acetaminophen 325 MG tablet Commonly known as: TYLENOL Take 2 tablets (650 mg total) by mouth every 6 (six) hours as needed for moderate pain (or Fever >/= 101).   albuterol (2.5 MG/3ML) 0.083% nebulizer solution Commonly known as: PROVENTIL Take 3 mLs (2.5 mg total) by nebulization every 6 (six) hours as needed for wheezing or shortness of breath.   allopurinol 100 MG tablet Commonly known as: ZYLOPRIM Take 1 tablet (100 mg total) by mouth daily.   budesonide-formoterol 160-4.5 MCG/ACT inhaler Commonly known as: Symbicort one - two inhalations every 12 hours; gargle and spit after use What changed:   how much to take  how to take this  when to take this  reasons to take this  additional instructions   calcium carbonate 500 MG chewable tablet Commonly known as: TUMS - dosed in mg elemental calcium Chew 2 tablets by mouth daily  as needed for indigestion or heartburn.   carvedilol 25 MG tablet Commonly known as: COREG TAKE 1 TABLET BY MOUTH TWICE DAILY WITH A MEAL What changed: See the new instructions.   cholecalciferol 25 MCG (1000 UT) tablet Commonly known as: VITAMIN D3 Take 1,000 Units by mouth daily.   COD LIVER OIL PO Take 1 capsule by mouth  daily.   colchicine 0.6 MG tablet Take 1 tablet (0.6 mg total) by mouth daily.   cyanocobalamin 1000 MCG/ML injection Commonly known as: (VITAMIN B-12) INJECT 1ML INTO MUSCLE EVERY 30 DAYS What changed:   how much to take  how to take this  when to take this  additional instructions   diclofenac sodium 1 % Gel Commonly known as: VOLTAREN Apply 2 g topically 4 (four) times daily. What changed:   when to take this  reasons to take this   Ecotrin Low Strength 81 MG EC tablet Generic drug: aspirin Take 81 mg by mouth at bedtime.   famotidine 20 MG tablet Commonly known as: Pepcid Take 1 tablet (20 mg total) by mouth 2 (two) times daily.   ferrous sulfate 325 (65 FE) MG tablet Take 1 tablet (325 mg total) by mouth daily with breakfast. Start taking on: November 17, 2018   Fish Oil 1000 MG Caps Take 1,000 mg by mouth daily.   folic acid 737 MCG tablet Commonly known as: FOLVITE Take 400 mcg by mouth at bedtime.   furosemide 80 MG tablet Commonly known as: LASIX Take 1-1.5 tablets (80-120 mg total) by mouth 2 (two) times daily. 1.5 tablets in morning, 1.5 tablet in afternoon What changed:   how much to take  additional instructions   gabapentin 100 MG capsule Commonly known as: Neurontin Take 1 capsule (100 mg total) by mouth 3 (three) times daily.   hydrOXYzine 25 MG tablet Commonly known as: ATARAX/VISTARIL Take 25 mg by mouth every 6 (six) hours as needed for itching.   montelukast 10 MG tablet Commonly known as: SINGULAIR Take 1 tablet (10 mg total) by mouth at bedtime.   Oxcarbazepine 300 MG tablet Commonly known as: TRILEPTAL Take 1 tablet (300 mg total) by mouth 2 (two) times daily.   potassium chloride SA 20 MEQ tablet Commonly known as: KLOR-CON Take 20 mEq by mouth 3 (three) times daily.   silver sulfADIAZINE 1 % cream Commonly known as: Silvadene Apply 1 application topically daily.   simvastatin 40 MG tablet Commonly known as:  ZOCOR Take 1 tablet (40 mg total) by mouth at bedtime.   spironolactone 25 MG tablet Commonly known as: ALDACTONE TAKE 1 TABLET BY MOUTH ONCE DAILY   traMADol 50 MG tablet Commonly known as: ULTRAM Take 1 tablet (50 mg total) by mouth every 6 (six) hours as needed for moderate pain.       Contact information for follow-up providers    Burns, Claudina Lick, MD.   Specialty: Internal Medicine Contact information: [place] [address] [phone]            Contact information for after-discharge care    Destination    [facility] Preferred SNF .   Service: Skilled Nursing Contact information: [address] [address] [phone]                 Allergies  Allergen Reactions  . Penicillins Rash and Other (See Comments)    She was told not to take it anymore. Has patient had a PCN reaction  causing immediate rash, facial/tongue/throat swelling, SOB or lightheadedness with hypotension: Yes Has patient had a PCN reaction causing severe rash involving mucus membranes or skin necrosis: No Has patient had a PCN reaction that required hospitalization: No Has patient had a PCN reaction occurring within the last 10 years: No If all of the above answers are "NO", then may proceed with Cephalosporin use.'  . Shellfish Allergy Anaphylaxis  . Sulfonamide Derivatives Anaphylaxis and Other (See Comments)    REACTION: internal "burning"  . Hydrochlorothiazide W-Triamterene Other (See Comments)    Hypokalemia  . Lotensin [Benazepril Hcl] Hives  . Dipyridamole Other (See Comments)    Unknown reaction  . Estrogens Other (See Comments)    Unknown reaction  . Hydrochlorothiazide Other (See Comments)    Unknown reaction  . Metronidazole Other (See Comments)    Unknown reaction  . Other     Pickles-itching  . Spironolactone Other (See Comments)    UNSPECIFIED > "kidney problems"  . Sulfa Antibiotics Other (See Comments)     Unknown reaction  . Torsemide Other (See Comments)    Unknown reaction  . Valsartan Other (See Comments)    Unknown reaction  . Madelaine Bhat Isothiocyanate] Itching    Consultations:     Procedures/Studies: Dg Knee 1-2 Views Right  Result Date: 11/10/2018 CLINICAL DATA:  Chronic knee pain EXAM: RIGHT KNEE - 1-2 VIEW COMPARISON:  09/07/2018, 02/15/2018 FINDINGS: Cortex irregularity at the right fibular head neck is chronic. Advanced tricompartment arthritis with severe joint space narrowing, bony spurring, and subarticular sclerosis. Suspect small knee effusion. IMPRESSION: 1. No acute osseous abnormality 2. Advanced tricompartment arthritis with suspected knee effusion Electronically Signed   By: Donavan Foil M.D.   On: 11/10/2018 20:08   Dg Chest Port 1 View  Result Date: 11/16/2018 CLINICAL DATA:  68 year old female with history of shortness of breath. EXAM: PORTABLE CHEST 1 VIEW COMPARISON:  Chest x-ray 11/15/2018. FINDINGS: Exam is limited by under penetration of the study. Lung volumes are low. No confluent consolidative airspace disease. No definite pleural effusions. No pneumothorax. Pulmonary vasculature is very congestion. No frank pulmonary edema. Heart size appears mildly enlarged. Upper mediastinal contours are within normal limits. IMPRESSION: 1. Mild cardiomegaly with pulmonary venous congestion, without frank pulmonary edema. Electronically Signed   By: Vinnie Langton M.D.   On: 11/16/2018 08:19   Dg Chest Port 1 View  Result Date: 11/15/2018 CLINICAL DATA:  Lower extremity venous hypertension and ulceration. EXAM: PORTABLE CHEST 1 VIEW COMPARISON:  11/14/2018 FINDINGS: Mild cardiomegaly. Chronic aortic atherosclerosis. Mild pulmonary venous hypertension without frank edema. No infiltrate, collapse or visible effusion. IMPRESSION: Mild cardiomegaly. Aortic atherosclerosis. Pulmonary venous hypertension without edema. Electronically Signed   By: Nelson Chimes M.D.   On:  11/15/2018 16:27   Dg Chest Port 1 View  Result Date: 11/14/2018 CLINICAL DATA:  68 year old female with shortness of breath. EXAM: PORTABLE CHEST 1 VIEW COMPARISON:  Chest radiograph dated 11/13/2018 FINDINGS: No focal consolidation, pleural effusion, pneumothorax. Stable mild cardiomegaly. There is mild vascular congestion. Atherosclerotic calcification of the aortic arch. No acute osseous pathology. IMPRESSION: Stable mild cardiomegaly with mild vascular congestion. No focal consolidation. Electronically Signed   By: Anner Crete M.D.   On: 11/14/2018 19:19   Dg Chest Port 1 View  Result Date: 11/13/2018 CLINICAL DATA:  Per order- SOB Pt HX: non smoker, DM, HTN, CHF EXAM: PORTABLE CHEST 1 VIEW COMPARISON:  11/12/2018 FINDINGS: Normal cardiac silhouette. Mild central venous pulmonary congestion similar prior. No focal consolidation.  Small LEFT effusion. No pneumothorax. IMPRESSION: Central venous congestion and small LEFT effusion. No interval change. Electronically Signed   By: Suzy Bouchard M.D.   On: 11/13/2018 06:13   Dg Chest Port 1 View  Result Date: 11/12/2018 CLINICAL DATA:  Pre order- SOB EXAM: PORTABLE CHEST 1 VIEW COMPARISON:  11/11/2018 FINDINGS: Normal cardiac silhouette. Low lung volumes. Central venous congestion similar prior. No overt pulmonary edema. Mild linear interstitial markings cyst with mild interstitial edema. IMPRESSION: 1. No interval change. 2. Central venous congestion and mild interstitial edema. 3. Low lung volumes. Electronically Signed   By: Suzy Bouchard M.D.   On: 11/12/2018 05:55   Dg Chest Port 1 View  Result Date: 11/11/2018 CLINICAL DATA:  68 year old female with history of shortness of breath. EXAM: PORTABLE CHEST 1 VIEW COMPARISON:  Chest x-ray 11/10/2018. FINDINGS: Lung volumes are low. No consolidative airspace disease. No pleural effusions. No pneumothorax. No pulmonary nodule or mass noted. Dilatation of the central pulmonary arteries,  concerning for pulmonary arterial hypertension. No evidence of pulmonary edema. Heart size is borderline enlarged. Upper mediastinal contours are within normal limits. Aortic atherosclerosis. IMPRESSION: 1. Low lung volumes without radiographic evidence of acute cardiopulmonary disease. 2. Prominence of the central pulmonary arteries, concerning for pulmonary arterial hypertension. Electronically Signed   By: Vinnie Langton M.D.   On: 11/11/2018 08:48   Dg Chest Port 1 View  Result Date: 11/10/2018 CLINICAL DATA:  Shortness of breath and wheezing this morning. EXAM: PORTABLE CHEST 1 VIEW COMPARISON:  Chest x-ray 11/09/2018 FINDINGS: The heart is mildly enlarged but stable. There is tortuosity and calcification of the thoracic aorta. Mild central vascular prominence and vascular congestion but no overt pulmonary edema. Low lung volumes with vascular crowding and streaky basilar atelectasis. No definite infiltrates. Suspect small left effusion. IMPRESSION: Cardiac enlargement and central vascular congestion without overt pulmonary edema. Suspect small left pleural effusion. Electronically Signed   By: Marijo Sanes M.D.   On: 11/10/2018 09:20   Dg Chest Portable 1 View  Result Date: 11/09/2018 CLINICAL DATA:  Hypoxemia EXAM: PORTABLE CHEST 1 VIEW COMPARISON:  08/26/2018 FINDINGS: Cardiomediastinal contours appear stable. Calcific aortic knob. Lung volumes are low. Prominence of the bilateral perihilar interstitial markings. No focal airspace consolidation, pleural effusion, or pneumothorax. IMPRESSION: Mild prominence of the bilateral perihilar interstitial markings, likely accentuated by low lung volumes. Otherwise, no focal consolidation. Consider repeat inspiratory PA and lateral radiographs of the chest if further evaluation is clinically warranted. Electronically Signed   By: Davina Poke M.D.   On: 11/09/2018 11:50   Dg Foot 2 Views Right  Result Date: 11/10/2018 CLINICAL DATA:  Chronic pain  EXAM: RIGHT FOOT - 2 VIEW COMPARISON:  None. FINDINGS: No acute displaced fracture or malalignment. Minimal degenerative change at the first MTP joint. Moderate plantar calcaneal spur and posterior enthesophyte IMPRESSION: No acute osseous abnormality Electronically Signed   By: Donavan Foil M.D.   On: 11/10/2018 20:09   Dg Hip Unilat With Pelvis 2-3 Views Right  Result Date: 11/09/2018 CLINICAL DATA:  Fever and hip pain. EXAM: DG HIP (WITH OR WITHOUT PELVIS) 2-3V RIGHT COMPARISON:  None. FINDINGS: There is no evidence of hip fracture or dislocation. There is no evidence of arthropathy or other focal bone abnormality. IMPRESSION: Negative. Electronically Signed   By: Titus Dubin M.D.   On: 11/09/2018 20:53   Vas [place] Lower Extremity Venous (dvt)  Result Date: 11/13/2018  Lower Venous Study Indications: Swelling, and Pain.  Limitations: Body habitus and  poor ultrasound/tissue interface. Comparison Study: previous study done 08/15/18 Performing Technologist: Abram Sander RVS  Examination Guidelines: A complete evaluation includes B-mode imaging, spectral Doppler, color Doppler, and power Doppler as needed of all accessible portions of each vessel. Bilateral testing is considered an integral part of a complete examination. Limited examinations for reoccurring indications may be performed as noted.  +---------+---------------+---------+-----------+----------+--------------+ RIGHT    CompressibilityPhasicitySpontaneityPropertiesThrombus Aging +---------+---------------+---------+-----------+----------+--------------+ CFV      Full           Yes      Yes                                 +---------+---------------+---------+-----------+----------+--------------+ SFJ      Full                                                        +---------+---------------+---------+-----------+----------+--------------+ FV Prox  Full                                                         +---------+---------------+---------+-----------+----------+--------------+ FV Mid   Full                                                        +---------+---------------+---------+-----------+----------+--------------+ FV Distal                                             Not visualized +---------+---------------+---------+-----------+----------+--------------+ PFV      Full                                                        +---------+---------------+---------+-----------+----------+--------------+ POP      Full           Yes      Yes                                 +---------+---------------+---------+-----------+----------+--------------+ PTV                                                   Not visualized +---------+---------------+---------+-----------+----------+--------------+ PERO                                                  Not visualized +---------+---------------+---------+-----------+----------+--------------+   +---------+---------------+---------+-----------+----------+--------------+ LEFT     CompressibilityPhasicitySpontaneityPropertiesThrombus Aging +---------+---------------+---------+-----------+----------+--------------+ CFV  Full           Yes      Yes                                 +---------+---------------+---------+-----------+----------+--------------+ SFJ      Full                                                        +---------+---------------+---------+-----------+----------+--------------+ FV Prox  Full                                                        +---------+---------------+---------+-----------+----------+--------------+ FV Mid   Full                                                        +---------+---------------+---------+-----------+----------+--------------+ FV DistalFull                                                         +---------+---------------+---------+-----------+----------+--------------+ PFV      Full                                                        +---------+---------------+---------+-----------+----------+--------------+ POP      Full           Yes      Yes                                 +---------+---------------+---------+-----------+----------+--------------+ PTV                                                   Not visualized +---------+---------------+---------+-----------+----------+--------------+ PERO                                                  Not visualized +---------+---------------+---------+-----------+----------+--------------+     Summary: Right: There is no evidence of deep vein thrombosis in the lower extremity. However, portions of this examination were limited- see technologist comments above. No cystic structure found in the popliteal fossa. Left: There is no evidence of deep vein thrombosis in the lower extremity. However, portions of this examination were limited- see technologist comments above. No cystic structure found in the popliteal fossa.  *See table(s) above for measurements and observations. Electronically signed  by Ruta Hinds MD on 11/13/2018 at 12:41:45 PM.    Final       Procedures:   Subjective: Patient is feeling better, her dyspnea has been improving, continue to have right lower extremity pain and decreased mobility.   Discharge Exam: Vitals:   11/15/18 2013 11/16/18 0435  BP: 128/62 119/61  Pulse: 83 75  Resp: 18 18  Temp: 98.5 F (36.9 C) 98.6 F (37 C)  SpO2: 94% 100%   Vitals:   11/15/18 0847 11/15/18 1302 11/15/18 2013 11/16/18 0435  BP:  (!) 151/88 128/62 119/61  Pulse:  71 83 75  Resp:  16 18 18   Temp:  98.8 F (37.1 C) 98.5 F (36.9 C) 98.6 F (37 C)  TempSrc:  Oral Oral Oral  SpO2: 97% 99% 94% 100%  Weight:      Height:        General: Not in pain or dyspnea, Neurology: Awake and alert, non focal   E ENT: no pallor, no icterus, oral mucosa moist Cardiovascular: No JVD. S1-S2 present, rhythmic, no gallops, rubs, or murmurs. +++ non pitting lower extremity edema. Pulmonary: positive breath sounds bilaterally, adequate air movement, no wheezing, rhonchi or rales./ anterior auscultation.  Gastrointestinal. Abdomen protuberant with no organomegaly, non tender, no rebound or guarding Skin. No rashes Musculoskeletal: no joint deformities   The results of significant diagnostics from this hospitalization (including imaging, microbiology, ancillary and laboratory) are listed below for reference.     Microbiology: Recent Results (from the past 240 hour(s))  Culture, Urine     Status: Abnormal   Collection Time: 11/09/18  6:29 PM   Specimen: Urine, Random  Result Value Ref Range Status   Specimen Description   Final    URINE, RANDOM Performed at [facility] [address], [place], [place] [address]    Special Requests   Final    NONE Performed at [facility], [place] [address] [place], [place] [address]    Culture >=100,000 COLONIES/mL KLEBSIELLA PNEUMONIAE (A)  Final   Report Status 11/11/2018 FINAL  Final   Organism ID, Bacteria KLEBSIELLA PNEUMONIAE (A)  Final      Susceptibility   Klebsiella pneumoniae - MIC*    AMPICILLIN RESISTANT Resistant     CEFAZOLIN <=4 SENSITIVE Sensitive     CEFTRIAXONE <=1 SENSITIVE Sensitive     CIPROFLOXACIN <=0.25 SENSITIVE Sensitive     GENTAMICIN <=1 SENSITIVE Sensitive     IMIPENEM <=0.25 SENSITIVE Sensitive     NITROFURANTOIN 128 RESISTANT Resistant     TRIMETH/SULFA <=20 SENSITIVE Sensitive     AMPICILLIN/SULBACTAM 4 SENSITIVE Sensitive     PIP/TAZO <=4 SENSITIVE Sensitive     Extended ESBL NEGATIVE Sensitive     * >=100,000 COLONIES/mL KLEBSIELLA PNEUMONIAE  SARS CORONAVIRUS 2 (TAT 6-24 HRS) Nasopharyngeal Nasopharyngeal Swab     Status: None   Collection Time: 11/09/18  7:25 PM   Specimen:  Nasopharyngeal Swab  Result Value Ref Range Status   SARS Coronavirus 2 NEGATIVE NEGATIVE Final    Comment: (NOTE) SARS-CoV-2 target nucleic acids are NOT DETECTED. The SARS-CoV-2 RNA is generally detectable in upper and lower respiratory specimens during the acute phase of infection. Negative results do not preclude SARS-CoV-2 infection, do not rule out co-infections with other pathogens, and should not be used as the sole basis for treatment or other patient management decisions. Negative results must be combined with clinical observations, patient history, and epidemiological information. The expected result is Negative. Fact Sheet  for Patients: SugarRoll.be Fact Sheet for Healthcare Providers: https://www.woods-mathews.com/ This test is not yet approved or cleared by the [place] FDA and  has been authorized for detection and/or diagnosis of SARS-CoV-2 by FDA under an Emergency Use Authorization (EUA). This EUA will remain  in effect (meaning this test can be used) for the duration of the COVID-19 declaration under Section 56 4(b)(1) of the Act, 21 U.S.C. section 360bbb-3(b)(1), unless the authorization is terminated or revoked sooner. Performed at [facility], [place] [address] [place], [place] [address]   SARS CORONAVIRUS 2 (TAT 6-24 HRS) Nasopharyngeal Nasopharyngeal Swab     Status: None   Collection Time: 11/14/18  3:25 PM   Specimen: Nasopharyngeal Swab  Result Value Ref Range Status   SARS Coronavirus 2 NEGATIVE NEGATIVE Final    Comment: (NOTE) SARS-CoV-2 target nucleic acids are NOT DETECTED. The SARS-CoV-2 RNA is generally detectable in upper and lower respiratory specimens during the acute phase of infection. Negative results do not preclude SARS-CoV-2 infection, do not rule out co-infections with other pathogens, and should not be used as the sole basis for treatment or other patient management  decisions. Negative results must be combined with clinical observations, patient history, and epidemiological information. The expected result is Negative. Fact Sheet for Patients: SugarRoll.be Fact Sheet for Healthcare Providers: https://www.woods-mathews.com/ This test is not yet approved or cleared by the [place] FDA and  has been authorized for detection and/or diagnosis of SARS-CoV-2 by FDA under an Emergency Use Authorization (EUA). This EUA will remain  in effect (meaning this test can be used) for the duration of the COVID-19 declaration under Section 56 4(b)(1) of the Act, 21 U.S.C. section 360bbb-3(b)(1), unless the authorization is terminated or revoked sooner. Performed at [facility], [place] [address] [place], [place] [address]      Labs: BNP (last 3 results) Recent Labs    08/15/18 1019 11/09/18 1140  BNP 23.6 16.9   Basic Metabolic Panel: Recent Labs  Lab 11/12/18 0332 11/13/18 0309 11/14/18 0759 11/15/18 0328 11/16/18 0307  NA 141 139 139 138 136  K 3.5 3.8 3.1* 4.5 4.2  CL 101 97* 95* 96* 96*  CO2 31 30 34* 32 31  GLUCOSE 108* 133* 96 107* 116*  BUN 17 15 16  24* 28*  CREATININE 1.13* 1.16* 1.27* 1.54* 1.49*  CALCIUM 8.1* 8.2* 8.7* 8.8* 8.4*  MG 2.1 2.2 2.1 2.2 2.2  PHOS 3.5 3.4 4.3 5.1* 4.1   Liver Function Tests: Recent Labs  Lab 11/12/18 0332 11/13/18 0309 11/14/18 0759 11/15/18 0328 11/16/18 0307  AST 25 23 19 21 22   ALT 16 14 14 15 17   ALKPHOS 66 70 71 75 84  BILITOT 0.6 1.0 0.4 0.3 0.5  PROT 5.3* 5.6* 5.9* 6.1* 5.9*  ALBUMIN 2.5* 2.7* 2.7* 2.8* 2.8*   No results for input(s): LIPASE, AMYLASE in the last 168 hours. No results for input(s): AMMONIA in the last 168 hours. CBC: Recent Labs  Lab 11/12/18 0332 11/13/18 0309 11/14/18 0759 11/15/18 0328 11/16/18 0307  WBC 5.4 5.3 4.1 4.9 4.9  NEUTROABS 3.1 2.8 2.0 2.4 2.3  HGB 9.7* 10.9* 10.6* 10.6* 10.4*  HCT 32.2* 36.7 35.4*  35.1* 35.2*  MCV 96.1 96.3 95.9 96.7 95.9  PLT 178 197 168 185 183   Cardiac Enzymes: No results for input(s): CKTOTAL, CKMB, CKMBINDEX, TROPONINI in the last 168 hours. BNP: Invalid input(s): POCBNP CBG: Recent Labs  Lab 11/15/18 0732 11/15/18 1201 11/15/18 1649 11/15/18 2054  11/16/18 0744  GLUCAP 92 99 121* 107* 87   D-Dimer No results for input(s): DDIMER in the last 72 hours. Hgb A1c No results for input(s): HGBA1C in the last 72 hours. Lipid Profile No results for input(s): CHOL, HDL, LDLCALC, TRIG, CHOLHDL, LDLDIRECT in the last 72 hours. Thyroid function studies No results for input(s): TSH, T4TOTAL, T3FREE, THYROIDAB in the last 72 hours.  Invalid input(s): FREET3 Anemia work up No results for input(s): VITAMINB12, FOLATE, FERRITIN, TIBC, IRON, RETICCTPCT in the last 72 hours. Urinalysis    Component Value Date/Time   COLORURINE YELLOW 11/09/2018 1829   APPEARANCEUR TURBID (A) 11/09/2018 1829   LABSPEC 1.012 11/09/2018 1829   PHURINE 5.0 11/09/2018 1829   GLUCOSEU NEGATIVE 11/09/2018 1829   GLUCOSEU NEGATIVE 02/19/2015 1707   HGBUR SMALL (A) 11/09/2018 1829   BILIRUBINUR NEGATIVE 11/09/2018 1829   BILIRUBINUR negative 09/05/2013 0832   KETONESUR NEGATIVE 11/09/2018 1829   PROTEINUR 30 (A) 11/09/2018 1829   UROBILINOGEN 2.0 (A) 02/19/2015 1707   NITRITE NEGATIVE 11/09/2018 1829   LEUKOCYTESUR LARGE (A) 11/09/2018 1829   Sepsis Labs Invalid input(s): PROCALCITONIN,  WBC,  LACTICIDVEN Microbiology Recent Results (from the past 240 hour(s))  Culture, Urine     Status: Abnormal   Collection Time: 11/09/18  6:29 PM   Specimen: Urine, Random  Result Value Ref Range Status   Specimen Description   Final    URINE, RANDOM Performed at [facility], [place] [address], [place], [place] [address]    Special Requests   Final    NONE Performed at [facility], [place] [address] [place], [place] [address]    Culture >=100,000  COLONIES/mL KLEBSIELLA PNEUMONIAE (A)  Final   Report Status 11/11/2018 FINAL  Final   Organism ID, Bacteria KLEBSIELLA PNEUMONIAE (A)  Final      Susceptibility   Klebsiella pneumoniae - MIC*    AMPICILLIN RESISTANT Resistant     CEFAZOLIN <=4 SENSITIVE Sensitive     CEFTRIAXONE <=1 SENSITIVE Sensitive     CIPROFLOXACIN <=0.25 SENSITIVE Sensitive     GENTAMICIN <=1 SENSITIVE Sensitive     IMIPENEM <=0.25 SENSITIVE Sensitive     NITROFURANTOIN 128 RESISTANT Resistant     TRIMETH/SULFA <=20 SENSITIVE Sensitive     AMPICILLIN/SULBACTAM 4 SENSITIVE Sensitive     PIP/TAZO <=4 SENSITIVE Sensitive     Extended ESBL NEGATIVE Sensitive     * >=100,000 COLONIES/mL KLEBSIELLA PNEUMONIAE  SARS CORONAVIRUS 2 (TAT 6-24 HRS) Nasopharyngeal Nasopharyngeal Swab     Status: None   Collection Time: 11/09/18  7:25 PM   Specimen: Nasopharyngeal Swab  Result Value Ref Range Status   SARS Coronavirus 2 NEGATIVE NEGATIVE Final    Comment: (NOTE) SARS-CoV-2 target nucleic acids are NOT DETECTED. The SARS-CoV-2 RNA is generally detectable in upper and lower respiratory specimens during the acute phase of infection. Negative results do not preclude SARS-CoV-2 infection, do not rule out co-infections with other pathogens, and should not be used as the sole basis for treatment or other patient management decisions. Negative results must be combined with clinical observations, patient history, and epidemiological information. The expected result is Negative. Fact Sheet for Patients: SugarRoll.be Fact Sheet for Healthcare Providers: https://www.woods-mathews.com/ This test is not yet approved or cleared by the [place] FDA and  has been authorized for detection and/or diagnosis of SARS-CoV-2 by FDA under an Emergency Use Authorization (EUA). This EUA will remain  in effect (meaning this test can be used) for the duration  of the COVID-19 declaration under Section  56 4(b)(1) of the Act, 21 U.S.C. section 360bbb-3(b)(1), unless the authorization is terminated or revoked sooner. Performed at [facility], [place] [address], [place], [place] [address]   SARS CORONAVIRUS 2 (TAT 6-24 HRS) Nasopharyngeal Nasopharyngeal Swab     Status: None   Collection Time: 11/14/18  3:25 PM   Specimen: Nasopharyngeal Swab  Result Value Ref Range Status   SARS Coronavirus 2 NEGATIVE NEGATIVE Final    Comment: (NOTE) SARS-CoV-2 target nucleic acids are NOT DETECTED. The SARS-CoV-2 RNA is generally detectable in upper and lower respiratory specimens during the acute phase of infection. Negative results do not preclude SARS-CoV-2 infection, do not rule out co-infections with other pathogens, and should not be used as the sole basis for treatment or other patient management decisions. Negative results must be combined with clinical observations, patient history, and epidemiological information. The expected result is Negative. Fact Sheet for Patients: SugarRoll.be Fact Sheet for Healthcare Providers: https://www.woods-mathews.com/ This test is not yet approved or cleared by the [place] FDA and  has been authorized for detection and/or diagnosis of SARS-CoV-2 by FDA under an Emergency Use Authorization (EUA). This EUA will remain  in effect (meaning this test can be used) for the duration of the COVID-19 declaration under Section 56 4(b)(1) of the Act, 21 U.S.C. section 360bbb-3(b)(1), unless the authorization is terminated or revoked sooner. Performed at [facility], [place] [address] [place], [place] [address]      Time coordinating discharge: 45 minutes  SIGNED:   Tawni Millers, MD  [facility] 11/16/2018, 10:54 AM

## 2018-11-17 ENCOUNTER — Ambulatory Visit: Payer: Medicare Other | Admitting: Orthopedic Surgery

## 2018-11-17 ENCOUNTER — Telehealth: Payer: Self-pay | Admitting: *Deleted

## 2018-11-17 NOTE — Telephone Encounter (Signed)
Pt was on TCM report admitted 11/09/18 for urinary tract infection complicated by worsening right lower extremity pain and decreased mobility. Urine culture was positive for Klebsiella, sensitive to cephalosporins.  Patient completed 5 days of antibiotics, and D/C to SNF. Per summary will need to see PCP 7 days after leaving SNF.Marland KitchenJohny Chess

## 2018-11-28 ENCOUNTER — Other Ambulatory Visit: Payer: Medicare Other | Admitting: Orthotics

## 2018-12-01 ENCOUNTER — Ambulatory Visit: Payer: Medicare Other

## 2018-12-05 ENCOUNTER — Ambulatory Visit: Payer: Medicare Other | Admitting: Orthopedic Surgery

## 2018-12-06 ENCOUNTER — Emergency Department (HOSPITAL_COMMUNITY): Payer: Medicare Other

## 2018-12-06 ENCOUNTER — Inpatient Hospital Stay (HOSPITAL_COMMUNITY)
Admission: EM | Admit: 2018-12-06 | Discharge: 2018-12-10 | DRG: 177 | Disposition: A | Discharge status: Skilled Nursing Facility | Payer: Medicare Other | Attending: Internal Medicine | Admitting: Internal Medicine

## 2018-12-06 ENCOUNTER — Encounter (HOSPITAL_COMMUNITY): Payer: Self-pay

## 2018-12-06 DIAGNOSIS — Z8249 Family history of ischemic heart disease and other diseases of the circulatory system: Secondary | ICD-10-CM

## 2018-12-06 DIAGNOSIS — E538 Deficiency of other specified B group vitamins: Secondary | ICD-10-CM | POA: Diagnosis present

## 2018-12-06 DIAGNOSIS — G4733 Obstructive sleep apnea (adult) (pediatric): Secondary | ICD-10-CM | POA: Diagnosis present

## 2018-12-06 DIAGNOSIS — M170 Bilateral primary osteoarthritis of knee: Secondary | ICD-10-CM | POA: Diagnosis present

## 2018-12-06 DIAGNOSIS — D649 Anemia, unspecified: Secondary | ICD-10-CM | POA: Diagnosis present

## 2018-12-06 DIAGNOSIS — L509 Urticaria, unspecified: Secondary | ICD-10-CM | POA: Diagnosis present

## 2018-12-06 DIAGNOSIS — M109 Gout, unspecified: Secondary | ICD-10-CM | POA: Diagnosis present

## 2018-12-06 DIAGNOSIS — J1289 Other viral pneumonia: Secondary | ICD-10-CM | POA: Diagnosis present

## 2018-12-06 DIAGNOSIS — Z803 Family history of malignant neoplasm of breast: Secondary | ICD-10-CM

## 2018-12-06 DIAGNOSIS — Z6841 Body Mass Index (BMI) 40.0 and over, adult: Secondary | ICD-10-CM

## 2018-12-06 DIAGNOSIS — Z800 Family history of malignant neoplasm of digestive organs: Secondary | ICD-10-CM

## 2018-12-06 DIAGNOSIS — Z91013 Allergy to seafood: Secondary | ICD-10-CM

## 2018-12-06 DIAGNOSIS — Z882 Allergy status to sulfonamides status: Secondary | ICD-10-CM

## 2018-12-06 DIAGNOSIS — Z8719 Personal history of other diseases of the digestive system: Secondary | ICD-10-CM | POA: Diagnosis not present

## 2018-12-06 DIAGNOSIS — I5032 Chronic diastolic (congestive) heart failure: Secondary | ICD-10-CM | POA: Diagnosis present

## 2018-12-06 DIAGNOSIS — J9601 Acute respiratory failure with hypoxia: Secondary | ICD-10-CM

## 2018-12-06 DIAGNOSIS — U071 COVID-19: Secondary | ICD-10-CM | POA: Diagnosis present

## 2018-12-06 DIAGNOSIS — Z7951 Long term (current) use of inhaled steroids: Secondary | ICD-10-CM

## 2018-12-06 DIAGNOSIS — I130 Hypertensive heart and chronic kidney disease with heart failure and stage 1 through stage 4 chronic kidney disease, or unspecified chronic kidney disease: Secondary | ICD-10-CM | POA: Diagnosis present

## 2018-12-06 DIAGNOSIS — Z823 Family history of stroke: Secondary | ICD-10-CM

## 2018-12-06 DIAGNOSIS — I10 Essential (primary) hypertension: Secondary | ICD-10-CM | POA: Diagnosis not present

## 2018-12-06 DIAGNOSIS — J1282 Pneumonia due to coronavirus disease 2019: Secondary | ICD-10-CM

## 2018-12-06 DIAGNOSIS — E785 Hyperlipidemia, unspecified: Secondary | ICD-10-CM | POA: Diagnosis present

## 2018-12-06 DIAGNOSIS — Z880 Allergy status to penicillin: Secondary | ICD-10-CM

## 2018-12-06 DIAGNOSIS — K219 Gastro-esophageal reflux disease without esophagitis: Secondary | ICD-10-CM | POA: Diagnosis present

## 2018-12-06 DIAGNOSIS — G5600 Carpal tunnel syndrome, unspecified upper limb: Secondary | ICD-10-CM | POA: Diagnosis present

## 2018-12-06 DIAGNOSIS — J129 Viral pneumonia, unspecified: Secondary | ICD-10-CM

## 2018-12-06 DIAGNOSIS — N183 Chronic kidney disease, stage 3 unspecified: Secondary | ICD-10-CM | POA: Diagnosis present

## 2018-12-06 DIAGNOSIS — E1122 Type 2 diabetes mellitus with diabetic chronic kidney disease: Secondary | ICD-10-CM | POA: Diagnosis present

## 2018-12-06 DIAGNOSIS — Z833 Family history of diabetes mellitus: Secondary | ICD-10-CM

## 2018-12-06 DIAGNOSIS — Z79899 Other long term (current) drug therapy: Secondary | ICD-10-CM

## 2018-12-06 DIAGNOSIS — G5710 Meralgia paresthetica, unspecified lower limb: Secondary | ICD-10-CM | POA: Diagnosis present

## 2018-12-06 DIAGNOSIS — Z888 Allergy status to other drugs, medicaments and biological substances status: Secondary | ICD-10-CM | POA: Diagnosis not present

## 2018-12-06 DIAGNOSIS — E1142 Type 2 diabetes mellitus with diabetic polyneuropathy: Secondary | ICD-10-CM | POA: Diagnosis present

## 2018-12-06 DIAGNOSIS — Z7982 Long term (current) use of aspirin: Secondary | ICD-10-CM

## 2018-12-06 DIAGNOSIS — Z8711 Personal history of peptic ulcer disease: Secondary | ICD-10-CM | POA: Diagnosis not present

## 2018-12-06 DIAGNOSIS — I2510 Atherosclerotic heart disease of native coronary artery without angina pectoris: Secondary | ICD-10-CM | POA: Diagnosis present

## 2018-12-06 DIAGNOSIS — J45909 Unspecified asthma, uncomplicated: Secondary | ICD-10-CM | POA: Diagnosis present

## 2018-12-06 DIAGNOSIS — Z8042 Family history of malignant neoplasm of prostate: Secondary | ICD-10-CM

## 2018-12-06 DIAGNOSIS — E782 Mixed hyperlipidemia: Secondary | ICD-10-CM

## 2018-12-06 DIAGNOSIS — I5033 Acute on chronic diastolic (congestive) heart failure: Secondary | ICD-10-CM | POA: Diagnosis present

## 2018-12-06 DIAGNOSIS — R1013 Epigastric pain: Secondary | ICD-10-CM | POA: Diagnosis present

## 2018-12-06 DIAGNOSIS — E876 Hypokalemia: Secondary | ICD-10-CM | POA: Diagnosis not present

## 2018-12-06 LAB — TROPONIN I (HIGH SENSITIVITY)
Troponin I (High Sensitivity): 16 ng/L (ref <18)
Troponin I (High Sensitivity): 15 ng/L (ref <18)

## 2018-12-06 LAB — BRAIN NATRIURETIC PEPTIDE: B Natriuretic Peptide: 32.5 pg/mL (ref 0.0–100.0)

## 2018-12-06 LAB — COMPREHENSIVE METABOLIC PANEL
ALT: 25 U/L (ref 0–44)
AST: 31 U/L (ref 15–41)
Albumin: 3.3 g/dL — ABNORMAL LOW (ref 3.5–5.0)
Alkaline Phosphatase: 73 U/L (ref 38–126)
Anion gap: 10 (ref 5–15)
BUN: 28 mg/dL — ABNORMAL HIGH (ref 8–23)
CO2: 31 mmol/L (ref 22–32)
Calcium: 8.6 mg/dL — ABNORMAL LOW (ref 8.9–10.3)
Chloride: 97 mmol/L — ABNORMAL LOW (ref 98–111)
Creatinine, Ser: 1.58 mg/dL — ABNORMAL HIGH (ref 0.44–1.00)
GFR calc Af Amer: 39 mL/min — ABNORMAL LOW (ref >60)
GFR calc non Af Amer: 33 mL/min — ABNORMAL LOW (ref >60)
Glucose, Bld: 121 mg/dL — ABNORMAL HIGH (ref 70–99)
Potassium: 4.1 mmol/L (ref 3.5–5.1)
Sodium: 138 mmol/L (ref 135–145)
Total Bilirubin: 0.6 mg/dL (ref 0.3–1.2)
Total Protein: 6.3 g/dL — ABNORMAL LOW (ref 6.5–8.1)

## 2018-12-06 LAB — CBC WITH DIFFERENTIAL/PLATELET
Abs Immature Granulocytes: 0.01 10*3/uL (ref 0.00–0.07)
Basophils Absolute: 0 10*3/uL (ref 0.0–0.1)
Basophils Relative: 0 %
Eosinophils Absolute: 0 10*3/uL (ref 0.0–0.5)
Eosinophils Relative: 0 %
HCT: 42.7 % (ref 36.0–46.0)
Hemoglobin: 13 g/dL (ref 12.0–15.0)
Immature Granulocytes: 0 %
Lymphocytes Relative: 30 %
Lymphs Abs: 1.4 10*3/uL (ref 0.7–4.0)
MCH: 29.4 pg (ref 26.0–34.0)
MCHC: 30.4 g/dL (ref 30.0–36.0)
MCV: 96.6 fL (ref 80.0–100.0)
Monocytes Absolute: 0.5 10*3/uL (ref 0.1–1.0)
Monocytes Relative: 10 %
Neutro Abs: 2.7 10*3/uL (ref 1.7–7.7)
Neutrophils Relative %: 60 %
Platelets: 106 10*3/uL — ABNORMAL LOW (ref 150–400)
RBC: 4.42 MIL/uL (ref 3.87–5.11)
RDW: 15.6 % — ABNORMAL HIGH (ref 11.5–15.5)
WBC: 4.6 10*3/uL (ref 4.0–10.5)
nRBC: 0 % (ref 0.0–0.2)

## 2018-12-06 LAB — LACTATE DEHYDROGENASE: LDH: 219 U/L — ABNORMAL HIGH (ref 98–192)

## 2018-12-06 LAB — LACTIC ACID, PLASMA
Lactic Acid, Venous: 0.6 mmol/L (ref 0.5–1.9)
Lactic Acid, Venous: 0.6 mmol/L (ref 0.5–1.9)

## 2018-12-06 LAB — TRIGLYCERIDES: Triglycerides: 107 mg/dL (ref <150)

## 2018-12-06 LAB — C-REACTIVE PROTEIN: CRP: 3.6 mg/dL — ABNORMAL HIGH (ref <1.0)

## 2018-12-06 LAB — FERRITIN: Ferritin: 94 ng/mL (ref 11–307)

## 2018-12-06 LAB — PROCALCITONIN: Procalcitonin: <0.1 ng/mL

## 2018-12-06 LAB — SARS CORONAVIRUS 2 BY RT PCR (HOSPITAL ORDER, PERFORMED IN ~~LOC~~ HOSPITAL LAB): SARS Coronavirus 2: POSITIVE — AB

## 2018-12-06 LAB — D-DIMER, QUANTITATIVE: D-Dimer, Quant: 2.19 ug/mL-FEU — ABNORMAL HIGH (ref 0.00–0.50)

## 2018-12-06 LAB — FIBRINOGEN: Fibrinogen: 437 mg/dL (ref 210–475)

## 2018-12-06 MED ORDER — SODIUM CHLORIDE 0.9% FLUSH: 3.0000 mL | INTRAVENOUS | Status: DC | PRN

## 2018-12-06 MED ORDER — DEXAMETHASONE SODIUM PHOSPHATE 10 MG/ML IJ SOLN
6.0000 mg | INTRAMUSCULAR | Status: DC
  Administered 2018-12-06 – 2018-12-09 (×4): 6 mg via INTRAVENOUS
  Filled 2018-12-06 (×4): qty 1

## 2018-12-06 MED ORDER — SODIUM CHLORIDE 0.9 % IV SOLN
200.0000 mg | Freq: Once | INTRAVENOUS | Status: AC
  Administered 2018-12-07: 01:00:00 200 mg via INTRAVENOUS
  Filled 2018-12-06: qty 40

## 2018-12-06 MED ORDER — ENOXAPARIN SODIUM 60 MG/0.6ML ~~LOC~~ SOLN
60.0000 mg | Freq: Every day | SUBCUTANEOUS | Status: DC
  Administered 2018-12-06: 60 mg via SUBCUTANEOUS
  Filled 2018-12-06: qty 0.6

## 2018-12-06 MED ORDER — SODIUM CHLORIDE 0.9 % IV SOLN: 250.0000 mL | INTRAVENOUS | Status: DC | PRN

## 2018-12-06 MED ORDER — SODIUM CHLORIDE 0.9 % IV SOLN
100.0000 mg | INTRAVENOUS | Status: AC
  Administered 2018-12-07 – 2018-12-10 (×4): 100 mg via INTRAVENOUS
  Filled 2018-12-06 (×4): qty 20

## 2018-12-06 MED ORDER — SODIUM CHLORIDE 0.9% FLUSH
3.0000 mL | Freq: Two times a day (BID) | INTRAVENOUS | Status: DC
  Administered 2018-12-06 – 2018-12-10 (×8): 3 mL via INTRAVENOUS

## 2018-12-06 MED ORDER — ACETAMINOPHEN 325 MG PO TABS
650.0000 mg | ORAL_TABLET | Freq: Once | ORAL | Status: AC | PRN
  Administered 2018-12-06: 650 mg via ORAL
  Filled 2018-12-06: qty 2

## 2018-12-06 NOTE — ED Notes (Signed)
XR at bedside

## 2018-12-06 NOTE — Progress Notes (Signed)
Lovenox per Pharmacy for DVT Prophylaxis    Pharmacy has been consulted from dosing enoxaparin (lovenox) in this patient for DVT prophylaxis.  The pharmacist has reviewed pertinent labs (Hgb _13__; PLT_106__), patient weight (_157__kg) 10/20202 and renal function (CrCl_>30__mL/min) and decided that enoxaparin _60_mg SQ Q24Hrs is appropriate for this patient.  The pharmacy department will sign off at this time.  Please reconsult pharmacy if status changes or for further issues.  Thank you  Cyndia Diver PharmD, BCPS  12/06/2018, 9:23 PM

## 2018-12-06 NOTE — H&P (Addendum)
TRH H&P    Patient Demographics:    April [name], is a 68 y.o. female  MRN: [id number]  DOB - 1950/03/31  Admit Date - 12/06/2018  Referring MD/NP/PA:  Roslynn Amble  Outpatient Primary MD for the patient is [name], MD  Patient coming from:  home  Chief complaint-  dyspnea   HPI:    April [name]  is a 68 y.o. female, w hypertension, hyperlipidemia, Dm2, neuropathy, CKD stage 3, CAD , Asthma , Covid -19 positive, presents with c/o increase in  dyspnea for the past 4 days, dry cough, and fever.    In ED,  T 102.1, P 93, R 17, Bp 143/81  Pox 98% on [place]  CXR IMPRESSION: Mild cardiomegaly.  Vascular congestion.  Wbc 4.6, Hgb 13.0, Plt 106 Na 138, K 4.1, Bun 28, Creatinine 1.58 Ast 31, Alt 25 Glucose 121 Alb 3.3  D dimer 2.19  Pro calcitonin <.10 Trop 16 LDH 219 Ferritin 94 Crp 3.6 BNP 32.5  Pt will be admitted to [facility] for covid -19 infection.     Review of systems:    In addition to the HPI above,    No Headache, No changes with Vision or hearing, No problems swallowing food or Liquids, No Chest pain,   No Abdominal pain, No Nausea or Vomiting, bowel movements are regular, No Blood in stool or Urine, No dysuria, No new skin rashes or bruises, No new joints pains-aches,  No new weakness, tingling, numbness in any extremity, No recent weight gain or loss, No polyuria, polydypsia or polyphagia, No significant Mental Stressors.  All other systems reviewed and are negative.    Past History of the following :    Past Medical History:  Diagnosis Date  . Anemia   . Asthma   . CAD (coronary artery disease)   . Carpal tunnel syndrome   . Cellulitis of both lower extremities 04/11/2015  . CHF (congestive heart failure) ([place])   . Chronic kidney disease    [facility]- followed by [facility]  . Colon polyp, hyperplastic 2007 & 2012  . Complication of anesthesia 1999   svt with renal  calculi surgery, no problems since  . CTS (carpal tunnel syndrome)    bilateral  . Diabetes mellitus   . Eczema   . GERD (gastroesophageal reflux disease)   . History of kidney stones 1999  . Hyperlipidemia   . Hypertension   . Leg ulcer ([place]) 04/24/2015   Right lateral leg No evidence of an infection Monitor closely Keep edema controlled   . Leg ulcer ([place])    right lower  . Meralgia paresthetica    Dr. [name]  . Morbid obesity ([place])   . Morbid obesity ([place])   . Neuropathy    toes and legs  . Osteoarthrosis, unspecified whether generalized or localized, lower leg    knee  . PUD (peptic ulcer disease)   . Shortness of breath dyspnea    with exertion  . Sleep apnea    per progress note 02/25/2018  . Type II  or unspecified type diabetes mellitus without mention of complication, not stated as uncontrolled   . Unspecified hereditary and idiopathic peripheral neuropathy   . Urticaria   . Vitamin B12 deficiency   . Wears glasses   . Wound cellulitis    right upper leg, healing well      Past Surgical History:  Procedure Laterality Date  . ABDOMINAL HYSTERECTOMY    . CARDIAC CATHETERIZATION  2002   non obstructive disease  . colonoscopy with polypectomy  2007 & 2012    hyperplastic ;Dr Watt Climes  . COLONOSCOPY WITH PROPOFOL N/A 06/04/2015   Procedure: COLONOSCOPY WITH PROPOFOL;  Surgeon: Jerene Bears, MD;  Location: [facility];  Service: Gastroenterology;  Laterality: N/A;  . DEBRIDEMENT LEG Right 03/02/2018   WOUND VAC APPLIED  . DEBRIDEMENT LEG Right 05/27/2018   RIGHT LOWER LEG DEBRIDEMENT,  SKIN GRAFT, VAC PLACEMENT   . DILATION AND CURETTAGE OF UTERUS     multiple  . HEMORRHOID SURGERY    . I&D EXTREMITY Right 03/02/2018   Procedure: RIGHT LEG DEBRIDEMENT AND PLACE VAC;  Surgeon: [name], MD;  Location: [place];  Service: Orthopedics;  Laterality: Right;  . I&D EXTREMITY Right 03/04/2018   Procedure: REPEAT IRRIGATION AND DEBRIDEMENT RIGHT LEG, PLACE WOUND VAC;  Surgeon:  [name], MD;  Location: [place];  Service: Orthopedics;  Laterality: Right;  . I&D EXTREMITY Right 03/30/2018   Procedure: IRRIGATION AND DEBRIDEMENT RIGHT LEG, APPLY WOUND VAC;  Surgeon: [name], MD;  Location: [place];  Service: Orthopedics;  Laterality: Right;  . I&D EXTREMITY Right 05/27/2018   Procedure: RIGHT LOWER LEG DEBRIDEMENT,  SKIN GRAFT, VAC PLACEMENT;  Surgeon: [name], MD;  Location: [place];  Service: Orthopedics;  Laterality: Right;  RIGHT LOWER LEG DEBRIDEMENT,  SKIN GRAFT, VAC PLACEMENT  . renal calculi  12/1997   SVT with induction of anesthesia  . right knee arthroscopy    . SKIN SPLIT GRAFT Right 03/04/2018   Procedure: POSSIBLE SPLIT THICKNESS SKIN GRAFT;  Surgeon: [name], MD;  Location: [place];  Service: Orthopedics;  Laterality: Right;  . SKIN SPLIT GRAFT Right 04/01/2018   Procedure: REPEAT IRRIGATION AND DEBRIDEMENT RIGHT LEG, APPLY SPLIT THICKNESS SKIN GRAFT;  Surgeon: [name], MD;  Location: [place];  Service: Orthopedics;  Laterality: Right;  . TONSILLECTOMY AND ADENOIDECTOMY        Social History:      Social History   Tobacco Use  . Smoking status: Never Smoker  . Smokeless tobacco: Never Used  Substance Use Topics  . Alcohol use: No    Alcohol/week: 0.0 standard drinks       Family History :     Family History  Problem Relation Age of Onset  . Colon cancer Mother   . Prostate cancer Father   . Colon cancer Father   . Diabetes Maternal Aunt   . Breast cancer Maternal Aunt   . Diabetes Maternal Uncle   . Diabetes Paternal Aunt   . Stroke Paternal Aunt        > 65  . Heart disease Paternal Aunt   . Diabetes Paternal Uncle   . Breast cancer Maternal Aunt         X 2  . Breast cancer Cousin        Home Medications:   Prior to Admission medications   Medication Sig Start Date End Date Taking? Authorizing Provider  acetaminophen (TYLENOL) 325 MG tablet Take 2  tablets (650 mg total) by mouth every 6 (six) hours  as needed for moderate pain (or Fever >/= 101). 11/16/18  Yes Arrien, Jimmy Picket, MD  allopurinol (ZYLOPRIM) 100 MG tablet Take 1 tablet (100 mg total) by mouth daily. 08/29/18  Yes Burns, Claudina Lick, MD  albuterol (PROVENTIL) (2.5 MG/3ML) 0.083% nebulizer solution Take 3 mLs (2.5 mg total) by nebulization every 6 (six) hours as needed for wheezing or shortness of breath. 03/23/17   [name], MD  aspirin (ECOTRIN LOW STRENGTH) 81 MG EC tablet Take 81 mg by mouth at bedtime.     [provider]  budesonide-formoterol (SYMBICORT) 160-4.5 MCG/ACT inhaler one - two inhalations every 12 hours; gargle and spit after use Patient taking differently: Inhale 2 puffs into the lungs 2 (two) times daily as needed (shortness of breath). gargle and spit after use 02/19/15   Burns, Claudina Lick, MD  calcium carbonate (TUMS - DOSED IN MG ELEMENTAL CALCIUM) 500 MG chewable tablet Chew 2 tablets by mouth daily as needed for indigestion or heartburn.    [provider]  carvedilol (COREG) 25 MG tablet TAKE 1 TABLET BY MOUTH TWICE DAILY WITH A MEAL Patient taking differently: Take 25 mg by mouth daily.  07/18/18   [name], MD  cholecalciferol (VITAMIN D3) 25 MCG (1000 UT) tablet Take 1,000 Units by mouth daily.    [provider]  COD LIVER OIL PO Take 1 capsule by mouth daily.    [provider]  colchicine 0.6 MG tablet Take 1 tablet (0.6 mg total) by mouth daily. 10/28/18   [name], MD  cyanocobalamin (,VITAMIN B-12,) 1000 MCG/ML injection INJECT 1ML INTO MUSCLE EVERY 30 DAYS Patient taking differently: Inject 1,000 mcg into the muscle every 30 (thirty) days.  05/06/18   [name], MD  diclofenac sodium (VOLTAREN) 1 % GEL Apply 2 g topically 4 (four) times daily. Patient taking differently: Apply 2 g topically 4 (four) times daily as needed (pain).  03/20/16   Marcial Pacas, MD  famotidine (PEPCID) 20 MG tablet Take 1 tablet (20 mg total) by mouth 2 (two) times daily.  12/14/17   [name], MD  ferrous sulfate 325 (65 FE) MG tablet Take 1 tablet (325 mg total) by mouth daily with breakfast. 11/17/18 12/17/18  Arrien, Jimmy Picket, MD  folic acid (FOLVITE) 419 MCG tablet Take 400 mcg by mouth at bedtime.     [provider]  furosemide (LASIX) 80 MG tablet Take 1-1.5 tablets (80-120 mg total) by mouth 2 (two) times daily. 1.5 tablets in morning, 1.5 tablet in afternoon Patient taking differently: Take 120 mg by mouth 2 (two) times daily.  11/07/18   [name], MD  gabapentin (NEURONTIN) 100 MG capsule Take 1 capsule (100 mg total) by mouth 3 (three) times daily. 11/07/18 12/07/18  [name], MD  hydrOXYzine (ATARAX/VISTARIL) 25 MG tablet Take 25 mg by mouth every 6 (six) hours as needed for itching.     [provider]  montelukast (SINGULAIR) 10 MG tablet Take 1 tablet (10 mg total) by mouth at bedtime. 08/12/18   Kennith Gain, MD  Omega-3 Fatty Acids (FISH OIL) 1000 MG CAPS Take 1,000 mg by mouth daily.     [provider]  Oxcarbazepine (TRILEPTAL) 300 MG tablet Take 1 tablet (300 mg total) by mouth 2 (two) times daily. 11/07/18   [name], MD  potassium chloride SA (KLOR-CON) 20 MEQ tablet Take 20  mEq by mouth 3 (three) times daily.    [provider]  silver sulfADIAZINE (SILVADENE) 1 % cream Apply 1 application topically daily. 06/08/18   [name], MD  simvastatin (ZOCOR) 40 MG tablet Take 1 tablet (40 mg total) by mouth at bedtime. 04/25/18   [name], MD  spironolactone (ALDACTONE) 25 MG tablet TAKE 1 TABLET BY MOUTH ONCE DAILY Patient taking differently: Take 25 mg by mouth daily.  10/24/18   [name], MD  traMADol (ULTRAM) 50 MG tablet Take 1 tablet (50 mg total) by mouth every 6 (six) hours as needed for moderate pain. 11/16/18   Arrien, Jimmy Picket, MD     Allergies:     Allergies  Allergen Reactions  . Penicillins Rash and Other (See Comments)    She was told  not to take it anymore. Has patient had a PCN reaction causing immediate rash, facial/tongue/throat swelling, SOB or lightheadedness with hypotension: Yes Has patient had a PCN reaction causing severe rash involving mucus membranes or skin necrosis: No Has patient had a PCN reaction that required hospitalization: No Has patient had a PCN reaction occurring within the last 10 years: No If all of the above answers are "NO", then may proceed with Cephalosporin use.'  . Shellfish Allergy Anaphylaxis  . Sulfonamide Derivatives Anaphylaxis and Other (See Comments)    REACTION: internal "burning"  . Hydrochlorothiazide W-Triamterene Other (See Comments)    Hypokalemia  . Lotensin [Benazepril Hcl] Hives  . Dipyridamole Other (See Comments)    Unknown reaction  . Estrogens Other (See Comments)    Unknown reaction  . Hydrochlorothiazide Other (See Comments)    Unknown reaction  . Metronidazole Other (See Comments)    Unknown reaction  . Other     Pickles-itching  . Spironolactone Other (See Comments)    UNSPECIFIED > "kidney problems"  . Sulfa Antibiotics Other (See Comments)    Unknown reaction  . Torsemide Other (See Comments)    Unknown reaction  . Valsartan Other (See Comments)    Unknown reaction  . Madelaine Bhat Isothiocyanate] Itching     Physical Exam:   Vitals  Blood pressure (!) 128/93, pulse 79, temperature (!) 100.4 F (38 C), temperature source Oral, resp. rate 17, SpO2 98 %.  1.  General: axoxo3  2. Psychiatric: euthymic  3. Neurologic: cn2-12 intact, reflexes 2+ symmetric, diffuse with no clonus, motor 5/5 in all 4 ext  4. HEENMT:  Anicteric, pupils 1.38mm symmetric, direct, consensual, near intact Neck: no jvd  5. Respiratory : Bibasilar crackles, no wheezing  6. Cardiovascular : rrr s1, s2, no m/g/r  7. Gastrointestinal:  Abd: soft, nt, nd, +bs  8. Skin:  Ext: no c/c/e,  No rash  9.Musculoskeletal:  Good ROM    Data Review:    CBC Recent  Labs  Lab 12/06/18 1955  WBC 4.6  HGB 13.0  HCT 42.7  PLT 106*  MCV 96.6  MCH 29.4  MCHC 30.4  RDW 15.6*  LYMPHSABS 1.4  MONOABS 0.5  EOSABS 0.0  BASOSABS 0.0   ------------------------------------------------------------------------------------------------------------------  Results for orders placed or performed during the hospital encounter of 12/06/18 (from the past 48 hour(s))  SARS Coronavirus 2 by RT PCR (hospital order, performed in [place] hospital lab) Nasopharyngeal Nasopharyngeal Swab     Status: Abnormal   Collection Time: 12/06/18  7:55 PM   Specimen: Nasopharyngeal Swab  Result Value Ref Range   SARS Coronavirus 2 POSITIVE (A) NEGATIVE  Comment: RESULT CALLED TO, READ BACK BY AND VERIFIED WITH: [facility] FARRAR @ 0370 ON 12/06/2018 C VARNER (NOTE) If result is NEGATIVE SARS-CoV-2 target nucleic acids are NOT DETECTED. The SARS-CoV-2 RNA is generally detectable in upper and lower  respiratory specimens during the acute phase of infection. The lowest  concentration of SARS-CoV-2 viral copies this assay can detect is 250  copies / mL. A negative result does not preclude SARS-CoV-2 infection  and should not be used as the sole basis for treatment or other  patient management decisions.  A negative result may occur with  improper specimen collection / handling, submission of specimen other  than nasopharyngeal swab, presence of viral mutation(s) within the  areas targeted by this assay, and inadequate number of viral copies  (<250 copies / mL). A negative result must be combined with clinical  observations, patient history, and epidemiological information. If result is POSITIVE SARS-CoV-2 target nucleic acids are DETECT ED. The SARS-CoV-2 RNA is generally detectable in upper and lower  respiratory specimens during the acute phase of infection.  Positive  results are indicative of active infection with SARS-CoV-2.  Clinical  correlation with patient history and  other diagnostic information is  necessary to determine patient infection status.  Positive results do  not rule out bacterial infection or co-infection with other viruses. If result is PRESUMPTIVE POSTIVE SARS-CoV-2 nucleic acids MAY BE PRESENT.   A presumptive positive result was obtained on the submitted specimen  and confirmed on repeat testing.  While 2019 novel coronavirus  (SARS-CoV-2) nucleic acids may be present in the submitted sample  additional confirmatory testing may be necessary for epidemiological  and / or clinical management purposes  to differentiate between  SARS-CoV-2 and other Sarbecovirus currently known to infect humans.  If clinically indicated additional testing with an alternate test  methodology (LAB74 [id number]) is advised. The SARS-CoV-2 RNA is generally  detectable in upper and lower respiratory specimens during the acute  phase of infection. The expected result is Negative. Fact Sheet for Patients:  StrictlyIdeas.no Fact Sheet for Healthcare Providers: BankingDealers.co.za This test is not yet approved or cleared by the [place] FDA and has been authorized for detection and/or diagnosis of SARS-CoV-2 by FDA under an Emergency Use Authorization (EUA).  This EUA will remain in effect (meaning this test can be used) for the duration of the COVID-19 declaration under Section 564(b)(1) of the Act, 21 U.S.C. section 360bbb-3(b)(1), unless the authorization is terminated or revoked sooner. Performed at [facility], [place] [address], [place], [place] [address]   Lactic acid, plasma     Status: None   Collection Time: 12/06/18  7:55 PM  Result Value Ref Range   Lactic Acid, Venous 0.6 0.5 - 1.9 mmol/L    Comment: Performed at [facility], [place] [address], [place], [place] [address]  CBC WITH DIFFERENTIAL     Status: Abnormal   Collection Time: 12/06/18  7:55 PM  Result Value  Ref Range   WBC 4.6 4.0 - 10.5 K/uL   RBC 4.42 3.87 - 5.11 MIL/uL   Hemoglobin 13.0 12.0 - 15.0 g/dL   HCT 42.7 36.0 - 46.0 %   MCV 96.6 80.0 - 100.0 fL   MCH 29.4 26.0 - 34.0 pg   MCHC 30.4 30.0 - 36.0 g/dL   RDW 15.6 (H) 11.5 - 15.5 %   Platelets 106 (L) 150 - 400 K/uL    Comment: REPEATED TO VERIFY PLATELET COUNT CONFIRMED BY SMEAR SPECIMEN  CHECKED FOR CLOTS    nRBC 0.0 0.0 - 0.2 %   Neutrophils Relative % 60 %   Neutro Abs 2.7 1.7 - 7.7 K/uL   Lymphocytes Relative 30 %   Lymphs Abs 1.4 0.7 - 4.0 K/uL   Monocytes Relative 10 %   Monocytes Absolute 0.5 0.1 - 1.0 K/uL   Eosinophils Relative 0 %   Eosinophils Absolute 0.0 0.0 - 0.5 K/uL   Basophils Relative 0 %   Basophils Absolute 0.0 0.0 - 0.1 K/uL   Immature Granulocytes 0 %   Abs Immature Granulocytes 0.01 0.00 - 0.07 K/uL    Comment: Performed at [facility], [place] [address], [place], [place] [address]  Comprehensive metabolic panel     Status: Abnormal   Collection Time: 12/06/18  7:55 PM  Result Value Ref Range   Sodium 138 135 - 145 mmol/L   Potassium 4.1 3.5 - 5.1 mmol/L   Chloride 97 (L) 98 - 111 mmol/L   CO2 31 22 - 32 mmol/L   Glucose, Bld 121 (H) 70 - 99 mg/dL   BUN 28 (H) 8 - 23 mg/dL   Creatinine, Ser 1.58 (H) 0.44 - 1.00 mg/dL   Calcium 8.6 (L) 8.9 - 10.3 mg/dL   Total Protein 6.3 (L) 6.5 - 8.1 g/dL   Albumin 3.3 (L) 3.5 - 5.0 g/dL   AST 31 15 - 41 U/L   ALT 25 0 - 44 U/L   Alkaline Phosphatase 73 38 - 126 U/L   Total Bilirubin 0.6 0.3 - 1.2 mg/dL   GFR calc non Af Amer 33 (L) >60 mL/min   GFR calc Af Amer 39 (L) >60 mL/min   Anion gap 10 5 - 15    Comment: Performed at [facility], [place] [address] [place], [place] [address]  D-dimer, quantitative     Status: Abnormal   Collection Time: 12/06/18  7:55 PM  Result Value Ref Range   D-Dimer, Quant 2.19 (H) 0.00 - 0.50 ug/mL-FEU    Comment: (NOTE) At the manufacturer cut-off of 0.50 ug/mL FEU, this assay has been  documented to exclude PE with a sensitivity and negative predictive value of 97 to 99%.  At this time, this assay has not been approved by the FDA to exclude DVT/VTE. Results should be correlated with clinical presentation. Performed at [facility], [place] [address], [place], [place] [address]   Procalcitonin     Status: None   Collection Time: 12/06/18  7:55 PM  Result Value Ref Range   Procalcitonin <0.10 ng/mL    Comment:        Interpretation: PCT (Procalcitonin) <= 0.5 ng/mL: Systemic infection (sepsis) is not likely. Local bacterial infection is possible. (NOTE)       Sepsis PCT Algorithm           Lower Respiratory Tract                                      Infection PCT Algorithm    ----------------------------     ----------------------------         PCT < 0.25 ng/mL                PCT < 0.10 ng/mL         Strongly encourage             Strongly discourage   discontinuation  of antibiotics    initiation of antibiotics    ----------------------------     -----------------------------       PCT 0.25 - 0.50 ng/mL            PCT 0.10 - 0.25 ng/mL               OR       >80% decrease in PCT            Discourage initiation of                                            antibiotics      Encourage discontinuation           of antibiotics    ----------------------------     -----------------------------         PCT >= 0.50 ng/mL              PCT 0.26 - 0.50 ng/mL               AND        <80% decrease in PCT             Encourage initiation of                                             antibiotics       Encourage continuation           of antibiotics    ----------------------------     -----------------------------        PCT >= 0.50 ng/mL                  PCT > 0.50 ng/mL               AND         increase in PCT                  Strongly encourage                                      initiation of antibiotics    Strongly encourage escalation            of antibiotics                                     -----------------------------                                           PCT <= 0.25 ng/mL                                                 OR                                        >  80% decrease in PCT                                     Discontinue / Do not initiate                                             antibiotics Performed at [place] [address] [place], [place] [address]   Lactate dehydrogenase     Status: Abnormal   Collection Time: 12/06/18  7:55 PM  Result Value Ref Range   LDH 219 (H) 98 - 192 U/L    Comment: Performed at [facility], [place] [address] [place], [place] [address]  Ferritin     Status: None   Collection Time: 12/06/18  7:55 PM  Result Value Ref Range   Ferritin 94 11 - 307 ng/mL    Comment: Performed at [facility], [place] [address], [place], [place] [address]  Triglycerides     Status: None   Collection Time: 12/06/18  7:55 PM  Result Value Ref Range   Triglycerides 107 <150 mg/dL    Comment: Performed at [facility], [place] [address], [place], [place] [address]  Fibrinogen     Status: None   Collection Time: 12/06/18  7:55 PM  Result Value Ref Range   Fibrinogen 437 210 - 475 mg/dL    Comment: Performed at [facility], [place] [address], [place], [place] [address]  C-reactive protein     Status: Abnormal   Collection Time: 12/06/18  7:55 PM  Result Value Ref Range   CRP 3.6 (H) <1.0 mg/dL    Comment: Performed at [facility], [place] [address] [place], [place] [address]  Brain natriuretic peptide     Status: None   Collection Time: 12/06/18  7:55 PM  Result Value Ref Range   B Natriuretic Peptide 32.5 0.0 - 100.0 pg/mL    Comment: Performed at [facility], [place] [address] [place], [place] [address]  Troponin I (High Sensitivity)     Status: None   Collection  Time: 12/06/18  8:04 PM  Result Value Ref Range   Troponin I (High Sensitivity) 16 <18 ng/L    Comment: (NOTE) Elevated high sensitivity troponin I (hsTnI) values and significant  changes across serial measurements may suggest ACS but many other  chronic and acute conditions are known to elevate hsTnI results.  Refer to the "Links" section for chest pain algorithms and additional  guidance. Performed at [facility], [place] [address] [place], [place] [address]     Chemistries  Recent Labs  Lab 12/06/18 1955  NA 138  K 4.1  CL 97*  CO2 31  GLUCOSE 121*  BUN 28*  CREATININE 1.58*  CALCIUM 8.6*  AST 31  ALT 25  ALKPHOS 73  BILITOT 0.6   ------------------------------------------------------------------------------------------------------------------  ------------------------------------------------------------------------------------------------------------------ GFR: CrCl cannot be calculated (Unknown ideal weight.). Liver Function Tests: Recent Labs  Lab 12/06/18 1955  AST 31  ALT 25  ALKPHOS 73  BILITOT 0.6  PROT 6.3*  ALBUMIN 3.3*   No results for input(s): LIPASE, AMYLASE in the last 168 hours. No results for input(s): AMMONIA in the last 168 hours. Coagulation Profile: No results for input(s): INR, PROTIME  in the last 168 hours. Cardiac Enzymes: No results for input(s): CKTOTAL, CKMB, CKMBINDEX, TROPONINI in the last 168 hours. BNP (last 3 results) No results for input(s): PROBNP in the last 8760 hours. HbA1C: No results for input(s): HGBA1C in the last 72 hours. CBG: No results for input(s): GLUCAP in the last 168 hours. Lipid Profile: Recent Labs    12/06/18 1955  TRIG 107   Thyroid Function Tests: No results for input(s): TSH, T4TOTAL, FREET4, T3FREE, THYROIDAB in the last 72 hours. Anemia Panel: Recent Labs    12/06/18 1955  FERRITIN 94     --------------------------------------------------------------------------------------------------------------- Urine analysis:    Component Value Date/Time   COLORURINE YELLOW 11/09/2018 1829   APPEARANCEUR TURBID (A) 11/09/2018 1829   LABSPEC 1.012 11/09/2018 1829   PHURINE 5.0 11/09/2018 1829   GLUCOSEU NEGATIVE 11/09/2018 1829   GLUCOSEU NEGATIVE 02/19/2015 1707   HGBUR SMALL (A) 11/09/2018 1829   BILIRUBINUR NEGATIVE 11/09/2018 1829   BILIRUBINUR negative 09/05/2013 0832   KETONESUR NEGATIVE 11/09/2018 1829   PROTEINUR 30 (A) 11/09/2018 1829   UROBILINOGEN 2.0 (A) 02/19/2015 1707   NITRITE NEGATIVE 11/09/2018 1829   LEUKOCYTESUR LARGE (A) 11/09/2018 1829      Imaging Results:    Dg Chest Port 1 View  Result Date: 12/06/2018 CLINICAL DATA:  COVID positive, shortness of breath EXAM: PORTABLE CHEST 1 VIEW COMPARISON:  11/16/2018 FINDINGS: Heart is borderline enlarged. Mild vascular congestion. No confluent airspace opacities or effusions. No acute bony abnormality. IMPRESSION: Mild cardiomegaly.  Vascular congestion. Electronically Signed   By: Rolm Baptise M.D.   On: 12/06/2018 20:22    nsr at 90, nl axis, early R progression t inversion in v1-3.    Assessment & Plan:    Principal Problem:   COVID-19 virus infection Active Problems:   Hyperlipidemia   Essential hypertension  Covid-19 infection Dexamethasone 6mg  iv qday Remdesivir consult to pharmacy  Elevated D dimer Check VQ scan  Chronic CHF (diastolic),Hypertension Cont Carvedilol 25mg  po qday Cont Lasix 120mg  po bid Cont Spironolactone 25mg  po qday  Hyperlipidemia Cont Simvastatin 40mg  po qhs  DM2 fsbs ac and qhs, ISS  CKD stage 3 Check cmp in am  Diabetic neuropathy Cont Gabapentin 100mg  po qhs Cont Trileptal   Asthma Cont Singulair 10mg  po qday Cont Symbicort 2puff bid Cont Albuterol HFA 2puff q6h prn   Anemia Check cbc in am  H/o Gout Cont Allopurinol 100mg  po qday Cont Colchicine  0.6mg  po qday    DVT Prophylaxis-   Lovenox - SCDs   AM Labs Ordered, also please review Full Orders  Family Communication: Admission, patients condition and plan of care including tests being ordered have been discussed with the patient who indicate understanding and agree with the plan and Code Status.  Code Status:  FULL CODE per patient, left message for husband that patient admitted to [facility]  Admission status: Inpatient: Based on patients clinical presentation and evaluation of above clinical data, I have made determination that patient meets Inpatient criteria at this time.  Pt has covid -19 infection w hypoxia, .pt has morbid obeisity/ diabetes, and is high risk of clinical decline. Pt will require > 2 nites stay.   Time spent in minutes : 70 minutes   Jani Gravel M.D on 12/06/2018 at 11:03 PM

## 2018-12-06 NOTE — ED Triage Notes (Signed)
Pt complains of being short of breath and O2 sats decreasing, she also has an uncontrolled fever Pt tested positive for COVID+ at last visit

## 2018-12-06 NOTE — ED Provider Notes (Signed)
[facility] DEPT Provider Note   CSN: [id number] Arrival date & time: 12/06/18  1929     History   Chief Complaint No chief complaint on file.   HPI April [name] is a 68 y.o. female.  Presents emerged department with chief complaint of shortness of breath.  Patient diagnosed with COVID-19 at her nursing facility recently.  States she has been suffering from cough and difficulty breathing ever since.  No chest pain.  Has had body aches, generalized malaise.  Particularly having dyspnea on exertion.  Cough is nonproductive.,  Nonbloody.  Additional history obtained from EMS report, facility report.  Patient had been diagnosed with COVID-19, initially saturations were okay but today they reported low oxygen saturations.     HPI  Past Medical History:  Diagnosis Date  . Anemia   . Asthma   . CAD (coronary artery disease)   . Carpal tunnel syndrome   . Cellulitis of both lower extremities 04/11/2015  . CHF (congestive heart failure) ([place])   . Chronic kidney disease    [facility]- followed by [facility]  . Colon polyp, hyperplastic 2007 & 2012  . Complication of anesthesia 1999   svt with renal calculi surgery, no problems since  . CTS (carpal tunnel syndrome)    bilateral  . Diabetes mellitus   . Eczema   . GERD (gastroesophageal reflux disease)   . History of kidney stones 1999  . Hyperlipidemia   . Hypertension   . Leg ulcer ([place]) 04/24/2015   Right lateral leg No evidence of an infection Monitor closely Keep edema controlled   . Leg ulcer ([place])    right lower  . Meralgia paresthetica    Dr. [name]  . Morbid obesity ([place])   . Morbid obesity ([place])   . Neuropathy    toes and legs  . Osteoarthrosis, unspecified whether generalized or localized, lower leg    knee  . PUD (peptic ulcer disease)   . Shortness of breath dyspnea    with exertion  . Sleep apnea    per progress note 02/25/2018  . Type II or unspecified type diabetes mellitus  without mention of complication, not stated as uncontrolled   . Unspecified hereditary and idiopathic peripheral neuropathy   . Urticaria   . Vitamin B12 deficiency   . Wears glasses   . Wound cellulitis    right upper leg, healing well    Patient Active Problem List   Diagnosis Date Noted  . COVID-19 virus infection 12/06/2018  . Chronic venous hypertension (idiopathic) with ulcer of right lower extremity (CODE) ([place]) 11/09/2018  . UTI (urinary tract infection) 11/09/2018  . Right hip pain 11/09/2018  . Achilles tendon sprain, left, initial encounter   . Osteomyelitis of ankle or foot, acute, left ([place]) 09/07/2018  . Osteomyelitis of ankle or foot, left, acute ([place]) 09/07/2018  . Acute on chronic kidney failure ([place]) 08/15/2018  . Onychomycosis 05/19/2018  . Idiopathic chronic venous hypertension of right lower extremity with ulcer and inflammation ([place]) 05/19/2018  . Traumatic wound dehiscence   . Skin lumps 01/14/2018  . Bilateral leg cramps 12/14/2017  . Left shoulder pain 08/14/2016  . Meralgia paresthetica 07/16/2016  . Chronic left-sided low back pain with left-sided sciatica 06/02/2016  . AKI (acute kidney injury) ([place]) 03/28/2016  . Whole body pain 03/20/2016  . Diabetic peripheral neuropathy ([place]) 03/20/2016  . Osteopenia 01/11/2016  . CKD (chronic kidney disease) stage 3, GFR 30-59 ml/min ([facility]) 01/02/2016  . Hand  paresthesia 07/18/2015  . Carpal tunnel syndrome 06/07/2015  . Family history of colon cancer   . Diabetes mellitus with neurological manifestations ([place]) 04/18/2015  . Bilateral leg edema 04/11/2015  . Abnormality of gait 01/03/2015  . Hereditary and idiopathic peripheral neuropathy 01/03/2015  . GERD (gastroesophageal reflux disease) 06/17/2014  . Hx of colonic polyps 12/15/2012  . Vitamin B12 deficiency 11/03/2012  . Intrinsic asthma 03/23/2012  . Chronic diastolic heart failure ([place]) 02/20/2011  . OSA (obstructive sleep apnea) 09/17/2010  . URINARY  URGENCY 01/08/2010  . CAD, NATIVE VESSEL 11/20/2008  . Osteoarthritis of knees, bilateral 06/14/2008  . Hyperlipidemia 05/10/2007  . Essential hypertension 01/18/2007  . Hypokalemia 04/30/2006  . Morbid (severe) obesity due to excess calories ([place]) 04/30/2006  . HX, PERSONAL, PEPTIC ULCER DISEASE 04/30/2006    Past Surgical History:  Procedure Laterality Date  . ABDOMINAL HYSTERECTOMY    . CARDIAC CATHETERIZATION  2002   non obstructive disease  . colonoscopy with polypectomy  2007 & 2012    hyperplastic ;Dr Watt Climes  . COLONOSCOPY WITH PROPOFOL N/A 06/04/2015   Procedure: COLONOSCOPY WITH PROPOFOL;  Surgeon: Jerene Bears, MD;  Location: [facility];  Service: Gastroenterology;  Laterality: N/A;  . DEBRIDEMENT LEG Right 03/02/2018   WOUND VAC APPLIED  . DEBRIDEMENT LEG Right 05/27/2018   RIGHT LOWER LEG DEBRIDEMENT,  SKIN GRAFT, VAC PLACEMENT   . DILATION AND CURETTAGE OF UTERUS     multiple  . HEMORRHOID SURGERY    . I&D EXTREMITY Right 03/02/2018   Procedure: RIGHT LEG DEBRIDEMENT AND PLACE VAC;  Surgeon: [name], MD;  Location: [facility];  Service: Orthopedics;  Laterality: Right;  . I&D EXTREMITY Right 03/04/2018   Procedure: REPEAT IRRIGATION AND DEBRIDEMENT RIGHT LEG, PLACE WOUND VAC;  Surgeon: [name], MD;  Location: [facility];  Service: Orthopedics;  Laterality: Right;  . I&D EXTREMITY Right 03/30/2018   Procedure: IRRIGATION AND DEBRIDEMENT RIGHT LEG, APPLY WOUND VAC;  Surgeon: [name], MD;  Location: [facility];  Service: Orthopedics;  Laterality: Right;  . I&D EXTREMITY Right 05/27/2018   Procedure: RIGHT LOWER LEG DEBRIDEMENT,  SKIN GRAFT, VAC PLACEMENT;  Surgeon: [name], MD;  Location: [facility];  Service: Orthopedics;  Laterality: Right;  RIGHT LOWER LEG DEBRIDEMENT,  SKIN GRAFT, VAC PLACEMENT  . renal calculi  12/1997   SVT with induction of anesthesia  . right knee arthroscopy    . SKIN SPLIT GRAFT Right 03/04/2018   Procedure: POSSIBLE SPLIT THICKNESS SKIN  GRAFT;  Surgeon: [name], MD;  Location: [facility];  Service: Orthopedics;  Laterality: Right;  . SKIN SPLIT GRAFT Right 04/01/2018   Procedure: REPEAT IRRIGATION AND DEBRIDEMENT RIGHT LEG, APPLY SPLIT THICKNESS SKIN GRAFT;  Surgeon: [name], MD;  Location: [facility];  Service: Orthopedics;  Laterality: Right;  . TONSILLECTOMY AND ADENOIDECTOMY       OB History   No obstetric history on file.      Home Medications    Prior to Admission medications   Medication Sig Start Date End Date Taking? Authorizing Provider  acetaminophen (TYLENOL) 325 MG tablet Take 2 tablets (650 mg total) by mouth every 6 (six) hours as needed for moderate pain (or Fever >/= 101). 11/16/18   Arrien, Jimmy Picket, MD  albuterol (PROVENTIL) (2.5 MG/3ML) 0.083% nebulizer solution Take 3 mLs (2.5 mg total) by nebulization every 6 (six) hours as needed for wheezing or shortness of breath. 03/23/17   [name], MD  allopurinol (ZYLOPRIM) 100 MG tablet Take 1 tablet (100 mg total) by mouth daily. 08/29/18   [name], MD  aspirin (ECOTRIN LOW STRENGTH) 81 MG EC tablet Take 81 mg by mouth at bedtime.     [provider]  budesonide-formoterol (SYMBICORT) 160-4.5 MCG/ACT inhaler one - two inhalations every 12 hours; gargle and spit after use Patient taking differently: Inhale 2 puffs into the lungs 2 (two) times daily as needed (shortness of breath). gargle and spit after use 02/19/15   Burns, Claudina Lick, MD  calcium carbonate (TUMS - DOSED IN MG ELEMENTAL CALCIUM) 500 MG chewable tablet Chew 2 tablets by mouth daily as needed for indigestion or heartburn.    [provider]  carvedilol (COREG) 25 MG tablet TAKE 1 TABLET BY MOUTH TWICE DAILY WITH A MEAL Patient taking differently: Take 25 mg by mouth daily.  07/18/18   [name], MD  cholecalciferol (VITAMIN D3) 25 MCG (1000 UT) tablet Take 1,000 Units by mouth daily.    [provider]  COD LIVER OIL PO Take 1 capsule by mouth  daily.    [provider]  colchicine 0.6 MG tablet Take 1 tablet (0.6 mg total) by mouth daily. 10/28/18   [name], MD  cyanocobalamin (,VITAMIN B-12,) 1000 MCG/ML injection INJECT 1ML INTO MUSCLE EVERY 30 DAYS Patient taking differently: Inject 1,000 mcg into the muscle every 30 (thirty) days.  05/06/18   [name], MD  diclofenac sodium (VOLTAREN) 1 % GEL Apply 2 g topically 4 (four) times daily. Patient taking differently: Apply 2 g topically 4 (four) times daily as needed (pain).  03/20/16   Marcial Pacas, MD  famotidine (PEPCID) 20 MG tablet Take 1 tablet (20 mg total) by mouth 2 (two) times daily. 12/14/17   [name], MD  ferrous sulfate 325 (65 FE) MG tablet Take 1 tablet (325 mg total) by mouth daily with breakfast. 11/17/18 12/17/18  Arrien, Jimmy Picket, MD  folic acid (FOLVITE) 962 MCG tablet Take 400 mcg by mouth at bedtime.     [provider]  furosemide (LASIX) 80 MG tablet Take 1-1.5 tablets (80-120 mg total) by mouth 2 (two) times daily. 1.5 tablets in morning, 1.5 tablet in afternoon Patient taking differently: Take 120 mg by mouth 2 (two) times daily.  11/07/18   [name], MD  gabapentin (NEURONTIN) 100 MG capsule Take 1 capsule (100 mg total) by mouth 3 (three) times daily. 11/07/18 12/07/18  [name], MD  hydrOXYzine (ATARAX/VISTARIL) 25 MG tablet Take 25 mg by mouth every 6 (six) hours as needed for itching.     [provider]  montelukast (SINGULAIR) 10 MG tablet Take 1 tablet (10 mg total) by mouth at bedtime. 08/12/18   Kennith Gain, MD  Omega-3 Fatty Acids (FISH OIL) 1000 MG CAPS Take 1,000 mg by mouth daily.     [provider]  Oxcarbazepine (TRILEPTAL) 300 MG tablet Take 1 tablet (300 mg total) by mouth 2 (two) times daily. 11/07/18   [name], MD  potassium chloride SA (KLOR-CON) 20 MEQ tablet Take 20 mEq by mouth 3 (three) times daily.    [provider]  silver sulfADIAZINE  (SILVADENE) 1 % cream Apply 1 application topically daily. 06/08/18   [name], MD  simvastatin (ZOCOR) 40 MG tablet Take 1 tablet (40 mg total) by mouth at bedtime. 04/25/18   [name], MD  spironolactone (ALDACTONE) 25 MG tablet TAKE  1 TABLET BY MOUTH ONCE DAILY Patient taking differently: Take 25 mg by mouth daily.  10/24/18   [name], MD  traMADol (ULTRAM) 50 MG tablet Take 1 tablet (50 mg total) by mouth every 6 (six) hours as needed for moderate pain. 11/16/18   Arrien, Jimmy Picket, MD    Family History Family History  Problem Relation Age of Onset  . Colon cancer Mother   . Prostate cancer Father   . Colon cancer Father   . Diabetes Maternal Aunt   . Breast cancer Maternal Aunt   . Diabetes Maternal Uncle   . Diabetes Paternal Aunt   . Stroke Paternal Aunt        > 65  . Heart disease Paternal Aunt   . Diabetes Paternal Uncle   . Breast cancer Maternal Aunt         X 2  . Breast cancer Cousin     Social History Social History   Tobacco Use  . Smoking status: Never Smoker  . Smokeless tobacco: Never Used  Substance Use Topics  . Alcohol use: No    Alcohol/week: 0.0 standard drinks  . Drug use: No     Allergies   Penicillins, Shellfish allergy, Sulfonamide derivatives, Hydrochlorothiazide w-triamterene, Lotensin [benazepril hcl], Dipyridamole, Estrogens, Hydrochlorothiazide, Metronidazole, Other, Spironolactone, Sulfa antibiotics, Torsemide, Valsartan, and Mustard [allyl isothiocyanate]   Review of Systems Review of Systems  Constitutional: Positive for chills, fatigue and fever.  HENT: Negative for ear pain and sore throat.   Eyes: Negative for pain and visual disturbance.  Respiratory: Positive for cough and shortness of breath.   Cardiovascular: Negative for chest pain and palpitations.  Gastrointestinal: Negative for abdominal pain and vomiting.  Genitourinary: Negative for dysuria and hematuria.  Musculoskeletal: Negative for  arthralgias and back pain.  Skin: Negative for color change and rash.  Neurological: Negative for seizures and syncope.  All other systems reviewed and are negative.    Physical Exam Updated Vital Signs BP (!) 113/96   Pulse 87   Temp (!) 102 F (38.9 C) (Rectal)   Resp 14   SpO2 100%   Physical Exam Vitals signs and nursing note reviewed.  Constitutional:      General: She is not in acute distress.    Appearance: She is well-developed.     Comments: Morbidly obese, no acute distress  HENT:     Head: Normocephalic and atraumatic.  Eyes:     Conjunctiva/sclera: Conjunctivae normal.  Neck:     Musculoskeletal: Neck supple.  Cardiovascular:     Rate and Rhythm: Regular rhythm. Tachycardia present.     Heart sounds: No murmur.  Pulmonary:     Comments: Coarse breath sounds bilaterally, mild tachypnea but no respiratory distress, speaking in full sentences Abdominal:     Palpations: Abdomen is soft.     Tenderness: There is no abdominal tenderness.  Skin:    General: Skin is warm and dry.  Neurological:     Mental Status: She is alert.      ED Treatments / Results  Labs (all labs ordered are listed, but only abnormal results are displayed) Labs Reviewed  CBC WITH DIFFERENTIAL/PLATELET - Abnormal; Notable for the following components:      Result Value   RDW 15.6 (*)    Platelets 106 (*)    All other components within normal limits  COMPREHENSIVE METABOLIC PANEL - Abnormal; Notable for the following components:   Chloride 97 (*)    Glucose, Bld 121 (*)  BUN 28 (*)    Creatinine, Ser 1.58 (*)    Calcium 8.6 (*)    Total Protein 6.3 (*)    Albumin 3.3 (*)    GFR calc non Af Amer 33 (*)    GFR calc Af Amer 39 (*)    All other components within normal limits  D-DIMER, QUANTITATIVE (NOT AT [facility]) - Abnormal; Notable for the following components:   D-Dimer, Quant 2.19 (*)    All other components within normal limits  LACTATE DEHYDROGENASE - Abnormal; Notable  for the following components:   LDH 219 (*)    All other components within normal limits  C-REACTIVE PROTEIN - Abnormal; Notable for the following components:   CRP 3.6 (*)    All other components within normal limits  SARS CORONAVIRUS 2 BY RT PCR (HOSPITAL ORDER, [place] [facility])  CULTURE, BLOOD (ROUTINE X 2)  CULTURE, BLOOD (ROUTINE X 2)  LACTIC ACID, PLASMA  PROCALCITONIN  FERRITIN  FIBRINOGEN  LACTIC ACID, PLASMA  TRIGLYCERIDES  BRAIN NATRIURETIC PEPTIDE  CBC WITH DIFFERENTIAL/PLATELET  COMPREHENSIVE METABOLIC PANEL  ABO/RH  TROPONIN I (HIGH SENSITIVITY)    EKG EKG Interpretation  Date/Time:  Tuesday December 06 2018 19:48:17 EST Ventricular Rate:  92 PR Interval:    QRS Duration: 89 QT Interval:  350 QTC Calculation: 433 R Axis:   -3 Text Interpretation: Sinus rhythm Abnormal T, consider ischemia, anterior leads Confirmed by Madalyn Rob [phone]) on 12/06/2018 8:04:49 PM   Radiology Dg Chest Port 1 View  Result Date: 12/06/2018 CLINICAL DATA:  COVID positive, shortness of breath EXAM: PORTABLE CHEST 1 VIEW COMPARISON:  11/16/2018 FINDINGS: Heart is borderline enlarged. Mild vascular congestion. No confluent airspace opacities or effusions. No acute bony abnormality. IMPRESSION: Mild cardiomegaly.  Vascular congestion. Electronically Signed   By: Rolm Baptise M.D.   On: 12/06/2018 20:22    Procedures .Critical Care Performed by: Lucrezia Starch, MD Authorized by: Lucrezia Starch, MD   Critical care provider statement:    Critical care time (minutes):  35   Critical care was necessary to treat or prevent imminent or life-threatening deterioration of the following conditions:  Respiratory failure   Critical care was time spent personally by me on the following activities:  Discussions with consultants, evaluation of patient's response to treatment, examination of patient, ordering and performing treatments and interventions, ordering  and review of laboratory studies, ordering and review of radiographic studies, pulse oximetry, re-evaluation of patient's condition, obtaining history from patient or surrogate and review of old charts   (including critical care time)  Medications Ordered in ED Medications  enoxaparin (LOVENOX) injection 60 mg (has no administration in time range)  sodium chloride flush (NS) 0.9 % injection 3 mL (has no administration in time range)  sodium chloride flush (NS) 0.9 % injection 3 mL (has no administration in time range)  0.9 %  sodium chloride infusion (has no administration in time range)  dexamethasone (DECADRON) injection 6 mg (has no administration in time range)  acetaminophen (TYLENOL) tablet 650 mg (650 mg Oral Given 12/06/18 1957)     Initial Impression / Assessment and Plan / ED Course  I have reviewed the triage vital signs and the nursing notes.  Pertinent labs & imaging results that were available during my care of the patient were reviewed by me and considered in my medical decision making (see chart for details).  Clinical Course as of Dec 05 2145  Tue Dec 06, 2018  2005  Temp(!): 102.1 F (38.9 C) [RD]  2005 SpO2: 98 % [RD]  2005 Reviewed EKG, triage notes and triage vitals, will go assess patient  EKG 12-Lead [RD]    Clinical Course User Index [RD] Lucrezia Starch, MD       68 year old lady who presented to the ER with shortness of breath, reported low oxygen saturation after recent diagnosis of COVID-19.  Here patient was noted to have mild tachypnea and coarse breath sounds but not in any respiratory distress.  Oxygen saturation stable on 2 L nasal cannula.  She is morbidly obese with multiple medical comorbidities, given her oxygen requirement, believe she would strongly benefit from inpatient admission and further observation at this time.  Discussed case with Dr.Kim who agrees to admit patient, [facility] to [facility].  Final Clinical Impressions(s) / ED  Diagnoses   Final diagnoses:  Acute respiratory failure with hypoxia ([place])  COVID-19  Viral pneumonia    ED Discharge Orders    None       Lucrezia Starch, MD 12/06/18 2150

## 2018-12-06 NOTE — ED Notes (Signed)
Paperwork printed and carelink contacted 

## 2018-12-06 NOTE — Progress Notes (Signed)
Pharmacy: Remdesivir   Patient is a 68 y.o. female with COVID.  Pharmacy has been consulted for remdesivir dosing.   - ALT: 25 - CXR shows: "Mild cardiomegaly.  Vascular congestion." - Pt is requiring supplemental oxygen: (Yes, 2L [place])    A/P:  - Patient meets criteria for remdesivir. Will initiate remdesivir 200 mg once followed by 100 mg daily x 4 days.  - Daily CMET while on remdesivir - Will f/u pt's ALT and clinical condition

## 2018-12-06 NOTE — ED Notes (Signed)
Date and time results received: 12/06/18 2212   Test: covid Critical Value: positive  Name of Provider Notified: kim, md

## 2018-12-07 ENCOUNTER — Other Ambulatory Visit: Payer: Self-pay

## 2018-12-07 DIAGNOSIS — M170 Bilateral primary osteoarthritis of knee: Secondary | ICD-10-CM

## 2018-12-07 DIAGNOSIS — G4733 Obstructive sleep apnea (adult) (pediatric): Secondary | ICD-10-CM

## 2018-12-07 DIAGNOSIS — I5032 Chronic diastolic (congestive) heart failure: Secondary | ICD-10-CM

## 2018-12-07 LAB — CBC WITH DIFFERENTIAL/PLATELET
Abs Immature Granulocytes: 0 10*3/uL (ref 0.00–0.07)
Band Neutrophils: 12 %
Basophils Absolute: 0 10*3/uL (ref 0.0–0.1)
Basophils Relative: 0 %
Eosinophils Absolute: 0 10*3/uL (ref 0.0–0.5)
Eosinophils Relative: 0 %
HCT: 41.9 % (ref 36.0–46.0)
Hemoglobin: 12.4 g/dL (ref 12.0–15.0)
Lymphocytes Relative: 8 %
Lymphs Abs: 0.3 10*3/uL — ABNORMAL LOW (ref 0.7–4.0)
MCH: 28.3 pg (ref 26.0–34.0)
MCHC: 29.6 g/dL — ABNORMAL LOW (ref 30.0–36.0)
MCV: 95.7 fL (ref 80.0–100.0)
Monocytes Absolute: 0.1 10*3/uL (ref 0.1–1.0)
Monocytes Relative: 3 %
Neutro Abs: 2.8 10*3/uL (ref 1.7–7.7)
Neutrophils Relative %: 77 %
Platelets: 101 10*3/uL — ABNORMAL LOW (ref 150–400)
RBC: 4.38 MIL/uL (ref 3.87–5.11)
RDW: 15.2 % (ref 11.5–15.5)
WBC: 3.2 10*3/uL — ABNORMAL LOW (ref 4.0–10.5)
nRBC: 0 % (ref 0.0–0.2)

## 2018-12-07 LAB — COMPREHENSIVE METABOLIC PANEL
ALT: 25 U/L (ref 0–44)
AST: 31 U/L (ref 15–41)
Albumin: 3.2 g/dL — ABNORMAL LOW (ref 3.5–5.0)
Alkaline Phosphatase: 74 U/L (ref 38–126)
Anion gap: 10 (ref 5–15)
BUN: 32 mg/dL — ABNORMAL HIGH (ref 8–23)
CO2: 30 mmol/L (ref 22–32)
Calcium: 8.6 mg/dL — ABNORMAL LOW (ref 8.9–10.3)
Chloride: 98 mmol/L (ref 98–111)
Creatinine, Ser: 1.81 mg/dL — ABNORMAL HIGH (ref 0.44–1.00)
GFR calc Af Amer: 33 mL/min — ABNORMAL LOW (ref >60)
GFR calc non Af Amer: 28 mL/min — ABNORMAL LOW (ref >60)
Glucose, Bld: 185 mg/dL — ABNORMAL HIGH (ref 70–99)
Potassium: 4.4 mmol/L (ref 3.5–5.1)
Sodium: 138 mmol/L (ref 135–145)
Total Bilirubin: 0.8 mg/dL (ref 0.3–1.2)
Total Protein: 6.5 g/dL (ref 6.5–8.1)

## 2018-12-07 LAB — GLUCOSE, CAPILLARY: Glucose-Capillary: 127 mg/dL — ABNORMAL HIGH (ref 70–99)

## 2018-12-07 LAB — ABO/RH: ABO/RH(D): O POS

## 2018-12-07 MED ORDER — MOMETASONE FURO-FORMOTEROL FUM 200-5 MCG/ACT IN AERO
2.0000 | INHALATION_SPRAY | Freq: Two times a day (BID) | RESPIRATORY_TRACT | Status: DC
  Administered 2018-12-07 – 2018-12-10 (×7): 2 via RESPIRATORY_TRACT
  Filled 2018-12-07: qty 8.8

## 2018-12-07 MED ORDER — ALBUTEROL SULFATE HFA 108 (90 BASE) MCG/ACT IN AERS
2.0000 | INHALATION_SPRAY | Freq: Four times a day (QID) | RESPIRATORY_TRACT | Status: DC | PRN
  Administered 2018-12-08: 22:00:00 2 via RESPIRATORY_TRACT
  Filled 2018-12-07: qty 6.7

## 2018-12-07 MED ORDER — SIMVASTATIN 40 MG PO TABS
40.0000 mg | ORAL_TABLET | Freq: Every day | ORAL | Status: DC
  Administered 2018-12-07 – 2018-12-09 (×3): 40 mg via ORAL
  Filled 2018-12-07 (×4): qty 1

## 2018-12-07 MED ORDER — TRAMADOL HCL 50 MG PO TABS
50.0000 mg | ORAL_TABLET | Freq: Four times a day (QID) | ORAL | Status: DC | PRN
  Administered 2018-12-08 – 2018-12-09 (×4): 50 mg via ORAL
  Filled 2018-12-07 (×4): qty 1

## 2018-12-07 MED ORDER — COLCHICINE 0.6 MG PO TABS
0.6000 mg | ORAL_TABLET | Freq: Every day | ORAL | Status: DC
  Administered 2018-12-07 – 2018-12-10 (×4): 0.6 mg via ORAL
  Filled 2018-12-07 (×5): qty 1

## 2018-12-07 MED ORDER — MONTELUKAST SODIUM 10 MG PO TABS
10.0000 mg | ORAL_TABLET | Freq: Every day | ORAL | Status: DC
  Administered 2018-12-07 – 2018-12-09 (×3): 10 mg via ORAL
  Filled 2018-12-07 (×3): qty 1

## 2018-12-07 MED ORDER — OXCARBAZEPINE 300 MG PO TABS
300.0000 mg | ORAL_TABLET | Freq: Two times a day (BID) | ORAL | Status: DC
  Administered 2018-12-07 – 2018-12-10 (×7): 300 mg via ORAL
  Filled 2018-12-07 (×9): qty 1

## 2018-12-07 MED ORDER — FOLIC ACID 0.5 MG HALF TAB
500.0000 ug | ORAL_TABLET | Freq: Every day | ORAL | Status: DC
  Administered 2018-12-07 – 2018-12-09 (×3): 0.5 mg via ORAL
  Filled 2018-12-07 (×4): qty 1

## 2018-12-07 MED ORDER — ALLOPURINOL 100 MG PO TABS
100.0000 mg | ORAL_TABLET | Freq: Every day | ORAL | Status: DC
  Administered 2018-12-07 – 2018-12-10 (×4): 100 mg via ORAL
  Filled 2018-12-07 (×4): qty 1

## 2018-12-07 MED ORDER — ASPIRIN EC 81 MG PO TBEC
81.0000 mg | DELAYED_RELEASE_TABLET | Freq: Every day | ORAL | Status: DC
  Administered 2018-12-07 – 2018-12-09 (×3): 81 mg via ORAL
  Filled 2018-12-07 (×3): qty 1

## 2018-12-07 MED ORDER — FAMOTIDINE 20 MG PO TABS
20.0000 mg | ORAL_TABLET | Freq: Two times a day (BID) | ORAL | Status: DC
  Administered 2018-12-07 – 2018-12-10 (×7): 20 mg via ORAL
  Filled 2018-12-07 (×7): qty 1

## 2018-12-07 MED ORDER — CARVEDILOL 25 MG PO TABS
25.0000 mg | ORAL_TABLET | Freq: Every day | ORAL | Status: DC
  Administered 2018-12-07 – 2018-12-10 (×4): 25 mg via ORAL
  Filled 2018-12-07 (×5): qty 1

## 2018-12-07 MED ORDER — CALCIUM CARBONATE ANTACID 500 MG PO CHEW: 2.0000 | CHEWABLE_TABLET | Freq: Every day | ORAL | Status: DC | PRN

## 2018-12-07 MED ORDER — FERROUS SULFATE 325 (65 FE) MG PO TABS
325.0000 mg | ORAL_TABLET | Freq: Every day | ORAL | Status: DC
  Administered 2018-12-07 – 2018-12-10 (×4): 325 mg via ORAL
  Filled 2018-12-07 (×4): qty 1

## 2018-12-07 MED ORDER — GABAPENTIN 100 MG PO CAPS
100.0000 mg | ORAL_CAPSULE | Freq: Three times a day (TID) | ORAL | Status: DC
  Administered 2018-12-07 – 2018-12-10 (×11): 100 mg via ORAL
  Filled 2018-12-07 (×11): qty 1

## 2018-12-07 MED ORDER — ENOXAPARIN SODIUM 80 MG/0.8ML ~~LOC~~ SOLN
75.0000 mg | Freq: Every day | SUBCUTANEOUS | Status: DC
  Administered 2018-12-07 – 2018-12-09 (×3): 75 mg via SUBCUTANEOUS
  Filled 2018-12-07 (×3): qty 0.8

## 2018-12-07 MED ORDER — POTASSIUM CHLORIDE CRYS ER 20 MEQ PO TBCR
20.0000 meq | EXTENDED_RELEASE_TABLET | Freq: Three times a day (TID) | ORAL | Status: DC
  Administered 2018-12-07 – 2018-12-10 (×11): 20 meq via ORAL
  Filled 2018-12-07 (×11): qty 1

## 2018-12-07 MED ORDER — SPIRONOLACTONE 25 MG PO TABS
25.0000 mg | ORAL_TABLET | Freq: Every day | ORAL | Status: DC
  Administered 2018-12-07 – 2018-12-10 (×4): 25 mg via ORAL
  Filled 2018-12-07 (×5): qty 1

## 2018-12-07 MED ORDER — FUROSEMIDE 20 MG PO TABS
120.0000 mg | ORAL_TABLET | Freq: Two times a day (BID) | ORAL | Status: DC
  Administered 2018-12-07 – 2018-12-10 (×7): 120 mg via ORAL
  Filled 2018-12-07 (×7): qty 6

## 2018-12-07 NOTE — Progress Notes (Signed)
PROGRESS NOTE    April [name]  XTG:[id number] DOB: 1951-01-26 DOA: 12/06/2018 PCP: [name], MD    Brief Narrative:  68 year old female who presented with dyspnea.  She does have significant past medical history for hypertension, dyslipidemia, type 2 diabetes mellitus, chronic kidney disease stage III, coronary artery disease, asthma and neuropathy.  Patient reported a dry cough, fever and dyspnea for about 4 days, positive sick contacts at the SNF.  On her initial physical examination her temperature was 102.1, heart rate 93, respirate 17, blood pressure 143/81, oxygen saturation 98%.  Her lungs had bibasilar had rales, no wheezing, heart S1-S2 present rhythm, abdomen soft, no lower extremity edema. Sodium 138, potassium 4.1, chloride 97, bicarb 31, glucose 121, BUN 28, creatinine 1.58, white count 4.6, hemoglobin 13.0, hematocrit 42.7, platelets 106.  Her chest radiograph had very mild interstitial infiltrates, left lower lobe, right upper lobe.  EKG 92 bpm, normal axis, normal intervals, sinus rhythm, no ST segment changes, negative T waves V1 to V3.  Patient was admitted to the hospital with a working diagnosis of SARS COVID-19 viral pneumonia.  Assessment & Plan:   Principal Problem:   COVID-19 virus infection Active Problems:   Hyperlipidemia   Morbid (severe) obesity due to excess calories (HCC)   Essential hypertension   Osteoarthritis of knees, bilateral   OSA (obstructive sleep apnea)   Chronic diastolic heart failure ([place])   1. Acute hypoxic respiratory failure due to SARS COVID 19 viral pneumonia. Patient with improved dyspnea, but not yet back to baseline.   RR: 17  Pulse oxymetry: 95% Fi02: 28%, 2 LPM per [place].  COVID-19 Labs  Recent Labs    12/06/18 1955  DDIMER 2.19*  FERRITIN 94  LDH 219*  CRP 3.6*    Will continue to follow with inflammatory markers.   Continue medical therapy with Remdesivir and systemic steroids with dexamethasone IV.  Guaifenesin DM, chlorpheniramine, airway clearing techniques with flutter valve and incentive spirometer. Continue bronchodilator therapy with albuterol.   2. Chronic diastolic heart failure. Patient euvolemic, will continue blood pressure control with carvedilol, continue diuresis with furosemide and spironolactone.  3. CKD stage 3. Will continue to follow up on renal function and electrolytes.   4. T2DM with neuropathy and dyslipidemia. Will continue glucose cover and monitoring with insulin sliding scale. Patient is tolerating po well. Continue gabapentin and trileptal for neuropathy. Continue with statin therapy.    5. Asthma. No signs of acute exacerbation.    DVT prophylaxis: enoxaparin   Code Status:  full Family Communication: no family at the bedside  Disposition Plan/ discharge barriers: pending clinical improvement.   Body mass index is 58.13 kg/m. Malnutrition Type:      Malnutrition Characteristics:      Nutrition Interventions:     RN Pressure Injury Documentation:     Consultants:     Procedures:     Antimicrobials:       Subjective: Patient is feeling better, has positive dyspepsia and cough, no significant dyspnea, no nausea or vomiting.   Objective: Vitals:   12/07/18 0332 12/07/18 0337 12/07/18 0351 12/07/18 0715  BP: 135/68   (!) 140/97  Pulse: 76   79  Resp: 17   16  Temp:  98.7 F (37.1 C)  98.9 F (37.2 C)  TempSrc:  Oral  Oral  SpO2:    91%  Weight:   (!) 153.6 kg   Height:   5\' 4"  (1.626 m)     Intake/Output Summary (Last  24 hours) at 12/07/2018 0949 Last data filed at 12/07/2018 0400 Gross per 24 hour  Intake 0.73 ml  Output -  Net 0.73 ml   Filed Weights   12/07/18 0351  Weight: (!) 153.6 kg    Examination:   General: Not in pain or dyspnea, deconditioned.  Neurology: Awake and alert, non focal  E ENT: mild pallor, no icterus, oral mucosa moist Cardiovascular: No JVD. S1-S2 present, rhythmic, no gallops,  rubs, or murmurs. Trace lower extremity edema. Pulmonary: positive breath sounds bilaterally, adequate air movement, no wheezing, rhonchi or rales. Gastrointestinal. Abdomen protuberant with no organomegaly, non tender, no rebound or guarding Skin. Right distal leg with anterior ulcerated wound in place. Dry with no purulence or erythema.  Musculoskeletal: no joint deformities     Data Reviewed: I have personally reviewed following labs and imaging studies  CBC: Recent Labs  Lab 12/06/18 1955 12/07/18 0606  WBC 4.6 3.2*  NEUTROABS 2.7 2.8  HGB 13.0 12.4  HCT 42.7 41.9  MCV 96.6 95.7  PLT 106* 353*   Basic Metabolic Panel: Recent Labs  Lab 12/06/18 1955 12/07/18 0606  NA 138 138  K 4.1 4.4  CL 97* 98  CO2 31 30  GLUCOSE 121* 185*  BUN 28* 32*  CREATININE 1.58* 1.81*  CALCIUM 8.6* 8.6*   GFR: Estimated Creatinine Clearance: 44.3 mL/min (A) (by C-G formula based on SCr of 1.81 mg/dL (H)). Liver Function Tests: Recent Labs  Lab 12/06/18 1955 12/07/18 0606  AST 31 31  ALT 25 25  ALKPHOS 73 74  BILITOT 0.6 0.8  PROT 6.3* 6.5  ALBUMIN 3.3* 3.2*   No results for input(s): LIPASE, AMYLASE in the last 168 hours. No results for input(s): AMMONIA in the last 168 hours. Coagulation Profile: No results for input(s): INR, PROTIME in the last 168 hours. Cardiac Enzymes: No results for input(s): CKTOTAL, CKMB, CKMBINDEX, TROPONINI in the last 168 hours. BNP (last 3 results) No results for input(s): PROBNP in the last 8760 hours. HbA1C: No results for input(s): HGBA1C in the last 72 hours. CBG: No results for input(s): GLUCAP in the last 168 hours. Lipid Profile: Recent Labs    12/06/18 1955  TRIG 107   Thyroid Function Tests: No results for input(s): TSH, T4TOTAL, FREET4, T3FREE, THYROIDAB in the last 72 hours. Anemia Panel: Recent Labs    12/06/18 1955  FERRITIN 94      Radiology Studies: I have reviewed all of the imaging during this hospital visit  personally     Scheduled Meds: . allopurinol  100 mg Oral Daily  . aspirin EC  81 mg Oral QHS  . carvedilol  25 mg Oral Q breakfast  . colchicine  0.6 mg Oral Daily  . dexamethasone (DECADRON) injection  6 mg Intravenous Q24H  . enoxaparin (LOVENOX) injection  60 mg Subcutaneous QHS  . famotidine  20 mg Oral BID  . ferrous sulfate  325 mg Oral Q breakfast  . folic acid  299 mcg Oral QHS  . furosemide  120 mg Oral BID  . gabapentin  100 mg Oral TID  . mometasone-formoterol  2 puff Inhalation BID  . montelukast  10 mg Oral QHS  . Oxcarbazepine  300 mg Oral BID  . potassium chloride SA  20 mEq Oral TID  . simvastatin  40 mg Oral QHS  . sodium chloride flush  3 mL Intravenous Q12H  . spironolactone  25 mg Oral Daily   Continuous Infusions: . sodium chloride    .  remdesivir 100 mg in NS 250 mL       LOS: 1 day        Greyson Riccardi Gerome Apley, MD

## 2018-12-07 NOTE — ED Notes (Signed)
[facility] at bedside 

## 2018-12-08 LAB — CBC WITH DIFFERENTIAL/PLATELET
Abs Immature Granulocytes: 0.01 10*3/uL (ref 0.00–0.07)
Basophils Absolute: 0 10*3/uL (ref 0.0–0.1)
Basophils Relative: 0 %
Eosinophils Absolute: 0 10*3/uL (ref 0.0–0.5)
Eosinophils Relative: 0 %
HCT: 40.9 % (ref 36.0–46.0)
Hemoglobin: 12.3 g/dL (ref 12.0–15.0)
Immature Granulocytes: 0 %
Lymphocytes Relative: 29 %
Lymphs Abs: 0.8 10*3/uL (ref 0.7–4.0)
MCH: 28.7 pg (ref 26.0–34.0)
MCHC: 30.1 g/dL (ref 30.0–36.0)
MCV: 95.6 fL (ref 80.0–100.0)
Monocytes Absolute: 0.1 10*3/uL (ref 0.1–1.0)
Monocytes Relative: 4 %
Neutro Abs: 1.8 10*3/uL (ref 1.7–7.7)
Neutrophils Relative %: 67 %
Platelets: 102 10*3/uL — ABNORMAL LOW (ref 150–400)
RBC: 4.28 MIL/uL (ref 3.87–5.11)
RDW: 15.1 % (ref 11.5–15.5)
WBC: 2.7 10*3/uL — ABNORMAL LOW (ref 4.0–10.5)
nRBC: 0 % (ref 0.0–0.2)

## 2018-12-08 LAB — COMPREHENSIVE METABOLIC PANEL
ALT: 30 U/L (ref 0–44)
AST: 35 U/L (ref 15–41)
Albumin: 3.1 g/dL — ABNORMAL LOW (ref 3.5–5.0)
Alkaline Phosphatase: 68 U/L (ref 38–126)
Anion gap: 11 (ref 5–15)
BUN: 34 mg/dL — ABNORMAL HIGH (ref 8–23)
CO2: 29 mmol/L (ref 22–32)
Calcium: 8.6 mg/dL — ABNORMAL LOW (ref 8.9–10.3)
Chloride: 101 mmol/L (ref 98–111)
Creatinine, Ser: 1.56 mg/dL — ABNORMAL HIGH (ref 0.44–1.00)
GFR calc Af Amer: 39 mL/min — ABNORMAL LOW (ref >60)
GFR calc non Af Amer: 34 mL/min — ABNORMAL LOW (ref >60)
Glucose, Bld: 188 mg/dL — ABNORMAL HIGH (ref 70–99)
Potassium: 4.4 mmol/L (ref 3.5–5.1)
Sodium: 141 mmol/L (ref 135–145)
Total Bilirubin: 0.6 mg/dL (ref 0.3–1.2)
Total Protein: 6.2 g/dL — ABNORMAL LOW (ref 6.5–8.1)

## 2018-12-08 LAB — GLUCOSE, CAPILLARY
Glucose-Capillary: 151 mg/dL — ABNORMAL HIGH (ref 70–99)
Glucose-Capillary: 179 mg/dL — ABNORMAL HIGH (ref 70–99)
Glucose-Capillary: 108 mg/dL — ABNORMAL HIGH (ref 70–99)
Glucose-Capillary: 101 mg/dL — ABNORMAL HIGH (ref 70–99)

## 2018-12-08 NOTE — Progress Notes (Signed)
Occupational Therapy Note  PTA, pt was at SNF for rehab for short time before being admitted to [facility]. Pt states that before becoming sick she was able to ambulate short distances with her cane and longer distances with her RW. She required A with LB ADL and hygiene after toileting due to her body habitus. Pt currently requires Max A +2  With bed mobility and to stand with RW. Unable to sfaely complete BSC transfer at this time due to BLE weakness. VSS. Will need continued rehab at [facility]. Pt states she wants a "private room". Will follow acutely.     12/08/18 1600  OT Visit Information  Assistance Needed +3 or more  PT/OT/SLP Co-Evaluation/Treatment Yes  History of Present Illness 68 y/o female w/ extensive hx of peripheral neuropathy, DM II, sleep apnea, PUD, Osteoarthrosis, morbid obesity, meralgia paresthetica, HTN, HLD, GERD, CKD, CHF, cellulitis BLE, CAD, asthma, anemia, R knee arthroscopy. Admitted to hospital 10/7 w/ worsening weakness, UTI and wound on RLE, was d/c to SNF and subsequently dx with COVID 19. Currently admitted to hospital with SARS COVID 19 PNA.  Precautions  Precautions Fall  Restrictions  Weight Bearing Restrictions No  Home Living  Family/patient expects to be discharged to: [place] other  Available Help at Discharge Family;Available PRN/intermittently  Type of Home House  Home Access Stairs to enter  Entrance Stairs-Number of Steps 2  Entrance Stairs-Rails Right;Left  Home Layout Two level;Able to live on main level with bedroom/bathroom  Bathroom Shower/Tub Tub/shower unit  Tax adviser - 2 wheels;Cane - quad;Wheelchair - manual;BSC;Hospital bed  Additional Comments states spouse and son assist with ADLs  Prior Function  Level of [place] Needs assistance  Gait / Transfers Assistance Needed states was using cane for short distances and walker for longer distances,  reporting is questionable   ADL's / [place] needed assist with ADLs from family  Communication / Swallowing Assistance Needed none  Communication  Communication No difficulties  Pain Assessment  Pain Assessment Faces  Faces Pain Scale 8  Pain Location all over with any movement  Pain Descriptors / Indicators Discomfort;Grimacing;Guarding;Moaning  Pain Intervention(s) Limited activity within patient's tolerance  Cognition  Arousal/Alertness Awake/alert  Behavior During Therapy Anxious (labile)  Overall Cognitive Status No family/caregiver present to determine baseline cognitive functioning  General Comments Most likely baseline; will further assess; poor awareness and problem solving  Upper Extremity Assessment  Upper Extremity Assessment Generalized weakness  Lower Extremity Assessment  Lower Extremity Assessment Defer to PT evaluation  RLE Unable to fully assess due to pain;Unable to fully assess due to immobilization  RLE Sensation history of peripheral neuropathy  Cervical / Trunk Assessment  Cervical / Trunk Assessment Other exceptions (morbidly obsese)  ADL  Overall ADL's  Needs assistance/impaired  Grooming Set up;Sitting  Upper Body Bathing Minimal assistance;Sitting  Lower Body Bathing Maximal assistance;Bed level  Upper Body Dressing  Moderate assistance;Sitting  Lower Body Dressing Total assistance;Sit to/from Retail buyer Details (indicate cue type and reason) unable to safely complete  Toileting- Clothing Manipulation and Hygiene Total assistance  Functional mobility during ADLs Rolling walker;+2 for physical assistance;Maximal assistance (sit - stand only)  General ADL Comments Will benefit from AE`  Bed Mobility  Overal bed mobility Needs Assistance  Bed Mobility Supine to Sit;Sit to Supine;Rolling  Rolling Max assist;Total assist;+2 for physical assistance  Supine to sit Max assist;Total assist;+2 for physical assistance  Sit to  supine  Max assist;Total assist;+2 for physical assistance  Transfers  Overall transfer level Needs assistance  Equipment used Rolling walker (2 wheeled)  Transfer via [place]  (will need lift going forward, bit declines use)  Transfers Sit to/from Stand  Sit to Stand Max assist;Total assist;+2 physical assistance;From elevated surface  Stand pivot transfers  (did not attempt sec to safety)  General transfer comment Pt insists that she will get oob and sit in chair, max/total a to stand with walker w/ pt moaning in pain thorughout. Pt will need maxi mover to be able to get to recliner until further assessed  Balance  Overall balance assessment Needs assistance  Sitting-balance support Feet unsupported;Bilateral upper extremity supported  Sitting balance-Leahy Scale Poor  Postural control Right lateral lean  Standing balance support Bilateral upper extremity supported  Standing balance-Leahy Scale Zero  Standing balance comment max x 2 to maintain static stance w/ RW  Exercises  Exercises Other exercises  Other Exercises  Other Exercises has been instructed in use of flutter valve and also incentive spirometer  OT - End of Session  Equipment Utilized During Treatment Rolling walker;Gait belt  Activity Tolerance Patient tolerated treatment well  Patient left in bed;with call bell/phone within reach;with bed alarm set  Nurse Communication Mobility status  OT Assessment  OT Recommendation/Assessment Patient needs continued OT Services  OT Visit Diagnosis Unsteadiness on feet (R26.81);Other abnormalities of gait and mobility (R26.89);Muscle weakness (generalized) (M62.81);Other symptoms and signs involving cognitive function;Pain  Pain - part of body Leg  OT Problem List Decreased strength;Decreased range of motion;Decreased activity tolerance;Impaired balance (sitting and/or standing);Decreased cognition;Decreased safety awareness;Decreased knowledge of use of DME or  AE;Cardiopulmonary status limiting activity;Obesity;Impaired UE functional use;Pain  OT Plan  OT Frequency (ACUTE ONLY) Min 2X/week  OT Treatment/Interventions (ACUTE ONLY) Self-care/ADL training;Therapeutic exercise;Neuromuscular education;Energy conservation;DME and/or AE instruction;Therapeutic activities;Cognitive remediation/compensation;Patient/family education;Balance training  AM-PAC OT "6 Clicks" Daily Activity Outcome Measure (Version 2)  Help from another person eating meals? 4  Help from another person taking care of personal grooming? 3  Help from another person toileting, which includes using toliet, bedpan, or urinal? 1  Help from another person bathing (including washing, rinsing, drying)? 2  Help from another person to put on and taking off regular upper body clothing? 2  Help from another person to put on and taking off regular lower body clothing? 1  6 Click Score 13  OT Recommendation  Follow Up Recommendations SNF;Supervision/Assistance - 24 hour  OT Equipment None recommended by OT  Individuals Consulted  Consulted and Agree with Results and Recommendations Patient  Acute Rehab OT Goals  Patient Stated Goal get to toilet  OT Goal Formulation With patient  Time For Goal Achievement 12/22/18  Potential to Achieve Goals Good  OT Time Calculation  OT Start Time (ACUTE ONLY) 1159  OT Stop Time (ACUTE ONLY) 1232  OT Time Calculation (min) 33 min  OT General Charges  $OT Visit 1 Visit  OT Evaluation  $OT Eval Moderate Complexity [place], OT/L   Acute OT Clinical Specialist Acute Rehabilitation Services Pager [phone] Office [phone]

## 2018-12-08 NOTE — TOC Initial Note (Addendum)
Transition of Care [facility]) - Initial/Assessment Note    Patient Details  Name: April [name] MRN: [id number] Date of Birth: 1950-09-13  [facility] CM/SW Contact:    Carles Collet, RN Phone Number: 12/08/2018, 9:08 AM  Clinical Narrative:         [facility]. Could not reach patient through room phone, or cell phone, could not spouse at any number listed. LVM w [facility] to return call. Brager,Dwight Spouse [phone]  [phone]    Patient is identified as a high risk for readmission and will be followed by the [facility] team for DC planning. CM and CSW can be reached through secure chat via care teams or through [place].              Expected Discharge Plan: Skilled Nursing Facility Barriers to Discharge: Continued Medical Work up   Patient Goals and CMS Choice        Expected Discharge Plan and Services Expected Discharge Plan: [place]                                              Prior Living Arrangements/Services                       Activities of Daily Living Home Assistive Devices/Equipment: Wheelchair, Trapeze, [employer] fast streamer, Eyeglasses ADL Screening (condition at time of admission) Patient's cognitive ability adequate to safely complete daily activities?: Yes Is the patient deaf or have difficulty hearing?: No Does the patient have difficulty seeing, even when wearing glasses/contacts?: No Does the patient have difficulty concentrating, remembering, or making decisions?: No Patient able to express need for assistance with ADLs?: Yes Does the patient have difficulty dressing or bathing?: Yes Independently performs ADLs?: No Communication: Independent Dressing (OT): Dependent Is this a change from baseline?: Pre-admission baseline Grooming: Dependent Is this a change from baseline?: Pre-admission baseline Feeding: Independent Bathing: Dependent Is this a change from baseline?: Pre-admission  baseline Toileting: Dependent(Incontinent ) Is this a change from baseline?: Pre-admission baseline In/Out Bed: Dependent Is this a change from baseline?: Pre-admission baseline Walks in Home: Dependent Is this a change from baseline?: Pre-admission baseline Does the patient have difficulty walking or climbing stairs?: Yes Weakness of Legs: Both Weakness of Arms/Hands: None  Permission Sought/Granted                  Emotional Assessment              Admission diagnosis:  Acute respiratory failure with hypoxia ([place]) [J96.01] Viral pneumonia [J12.9] COVID-19 [U07.1] Patient Active Problem List   Diagnosis Date Noted  . COVID-19 virus infection 12/06/2018  . Chronic venous hypertension (idiopathic) with ulcer of right lower extremity (CODE) ([place]) 11/09/2018  . UTI (urinary tract infection) 11/09/2018  . Right hip pain 11/09/2018  . Achilles tendon sprain, left, initial encounter   . Osteomyelitis of ankle or foot, acute, left ([place]) 09/07/2018  . Osteomyelitis of ankle or foot, left, acute ([place]) 09/07/2018  . Acute on chronic kidney failure ([place]) 08/15/2018  . Onychomycosis 05/19/2018  . Idiopathic chronic venous hypertension of right lower extremity with ulcer and inflammation ([place]) 05/19/2018  . Traumatic wound dehiscence   . Skin lumps 01/14/2018  . Bilateral leg cramps 12/14/2017  . Left shoulder pain 08/14/2016  . Meralgia paresthetica 07/16/2016  . Chronic left-sided low  back pain with left-sided sciatica 06/02/2016  . AKI (acute kidney injury) ([place]) 03/28/2016  . Whole body pain 03/20/2016  . Diabetic peripheral neuropathy ([place]) 03/20/2016  . Osteopenia 01/11/2016  . CKD (chronic kidney disease) stage 3, GFR 30-59 ml/min (HCC) 01/02/2016  . Hand paresthesia 07/18/2015  . Carpal tunnel syndrome 06/07/2015  . Family history of colon cancer   . Diabetes mellitus with neurological manifestations ([place]) 04/18/2015  . Bilateral leg edema 04/11/2015  .  Abnormality of gait 01/03/2015  . Hereditary and idiopathic peripheral neuropathy 01/03/2015  . GERD (gastroesophageal reflux disease) 06/17/2014  . Hx of colonic polyps 12/15/2012  . Vitamin B12 deficiency 11/03/2012  . Intrinsic asthma 03/23/2012  . Chronic diastolic heart failure ([place]) 02/20/2011  . OSA (obstructive sleep apnea) 09/17/2010  . URINARY URGENCY 01/08/2010  . CAD, NATIVE VESSEL 11/20/2008  . Osteoarthritis of knees, bilateral 06/14/2008  . Hyperlipidemia 05/10/2007  . Essential hypertension 01/18/2007  . Hypokalemia 04/30/2006  . Morbid (severe) obesity due to excess calories ([place]) 04/30/2006  . HX, PERSONAL, PEPTIC ULCER DISEASE 04/30/2006   PCP:  [name], MD Pharmacy:   [facility] [place], [place] - [address] [place] [address] Phone: [phone] Fax: [phone]     Social Determinants of Health (SDOH) Interventions    Readmission Risk Interventions No flowsheet data found.

## 2018-12-08 NOTE — Evaluation (Signed)
Physical Therapy Evaluation Patient Details Name: April [name] MRN: [id number] DOB: 02-27-1950 Today's Date: 12/08/2018   History of Present Illness  68 y/o female w/ extensive hx of peripheral neuropathy, DM II, sleep apnea, PUD, Osteoarthrosis, morbid obesity, meralgia paresthetica, HTN, HLD, GERD, CKD, CHF, cellulitis BLE, CAD, asthma, anemia, R knee arthroscopy. Admitted to hospital 10/7 w/ worsening weakness, UTI and wound on RLE, was d/c to SNF and subsequently dx with COVID 19. Currently admitted to hospital with SARS COVID 19 PNA.  Clinical Impression   Pt admitted with above diagnosis. PTA was at SNF undergoing rehab, states prior to this was living home with spouse and son, who assisted with ADLs as needed at home. Pt states she was previously ambulatory with cane for short distances and walker for long distances. Pt currently with functional limitations due to the deficits listed below (see PT Problem List). She exhibits increased pain with all movement, states that she has chronic wound and neuropathy on RLe limiting movement. Pt was able to get to EOB with max x 2 assist and sit w/ extremal and BUE supported for some time, she was able to complete sit<>stand with max a x 2 and RW from elevated bed. Did not attempt stand pivot with pt today sec to safety concerns. Throughout this session pt was on room air. Pt will benefit from skilled PT to increase their independence and safety with mobility to allow discharge to the venue listed below.       Follow Up Recommendations SNF    Equipment Recommendations  None recommended by PT    Recommendations for Other Services       Precautions / Restrictions Precautions Precautions: Fall Precaution Comments: cognition Restrictions Weight Bearing Restrictions: No      Mobility  Bed Mobility Overal bed mobility: Needs Assistance Bed Mobility: Supine to Sit;Sit to Supine;Rolling Rolling: Max assist;Total assist;+2 for physical  assistance   Supine to sit: Max assist;Total assist;+2 for physical assistance Sit to supine: Max assist;Total assist;+2 for physical assistance      Transfers Overall transfer level: Needs assistance Equipment used: Rolling walker (2 wheeled) Transfers: Sit to/from Stand Sit to Stand: Max assist;Total assist;+2 physical assistance;From elevated surface Stand pivot transfers: (did not attempt sec to safety)       General transfer comment: Pt insists that she will get oob and sit in chair, max/total a to stand with walker w/ pt moaning in pain thorughout. Pt will need maxi mover to be able to get to recliner until further assessed  Ambulation/Gait             General Gait Details: unable to ambulate at this time   Stairs            Wheelchair Mobility    Modified Rankin (Stroke Patients Only)       Balance Overall balance assessment: Needs assistance Sitting-balance support: Feet unsupported;Bilateral upper extremity supported Sitting balance-Leahy Scale: Poor   Postural control: Right lateral lean Standing balance support: Bilateral upper extremity supported Standing balance-Leahy Scale: Zero Standing balance comment: max x 2 to maintain static stance w/ RW                             Pertinent Vitals/Pain Pain Assessment: Faces Faces Pain Scale: Hurts whole lot Pain Location: all over with any movement Pain Descriptors / Indicators: Discomfort;Grimacing;Guarding;Moaning Pain Intervention(s): Limited activity within patient's tolerance;Monitored during session    Home Living Family/patient  expects to be discharged to:: Skilled nursing facility Living Arrangements: Spouse/significant other Available Help at Discharge: Family;Available PRN/intermittently Type of Home: House Home Access: Stairs to enter Entrance Stairs-Rails: Right;Left Entrance Stairs-Number of Steps: 2 Home Layout: Two level;Able to live on main level with  bedroom/bathroom Home Equipment: Gilford Rile - 2 wheels;Cane - quad;Wheelchair - manual;Bedside commode;Hospital bed Additional Comments: states spouse and son assist with ADLs    Prior Function Level of Independence: Needs assistance   Gait / Transfers Assistance Needed: states was using cane for short distances and walker for longer distances, reporting is questionable   ADL's / Homemaking Assistance Needed: needed assist with ADLs from family        Hand Dominance        Extremity/Trunk Assessment   Upper Extremity Assessment Upper Extremity Assessment: Defer to OT evaluation    Lower Extremity Assessment Lower Extremity Assessment: Generalized weakness RLE: Unable to fully assess due to pain;Unable to fully assess due to immobilization RLE Sensation: history of peripheral neuropathy       Communication   Communication: No difficulties  Cognition Arousal/Alertness: Awake/alert Behavior During Therapy: WFL for tasks assessed/performed Overall Cognitive Status: Within Functional Limits for tasks assessed                                 General Comments: seems to be within baseline cognitive functioning      General Comments      Exercises Other Exercises Other Exercises: has been instructed in use of flutter valve and also incentive spirometer   Assessment/Plan    PT Assessment Patient needs continued PT services  PT Problem List Decreased strength;Decreased range of motion;Decreased activity tolerance;Decreased balance;Decreased mobility;Decreased coordination;Decreased cognition;Decreased safety awareness;Obesity;Pain       PT Treatment Interventions Functional mobility training;Therapeutic activities;Therapeutic exercise;Balance training;Neuromuscular re-education;Patient/family education    PT Goals (Current goals can be found in the Care Plan section)  Acute Rehab PT Goals Patient Stated Goal: get oob PT Goal Formulation: With patient Time  For Goal Achievement: 12/22/18 Potential to Achieve Goals: Fair    Frequency Min 2X/week   Barriers to discharge Decreased caregiver support      Co-evaluation               AM-PAC PT "6 Clicks" Mobility  Outcome Measure Help needed turning from your back to your side while in a flat bed without using bedrails?: A Lot Help needed moving from lying on your back to sitting on the side of a flat bed without using bedrails?: A Lot Help needed moving to and from a bed to a chair (including a wheelchair)?: Total Help needed standing up from a chair using your arms (e.g., wheelchair or bedside chair)?: Total Help needed to walk in hospital room?: Total Help needed climbing 3-5 steps with a railing? : Total 6 Click Score: 8    End of Session Equipment Utilized During Treatment: Gait belt Activity Tolerance: Treatment limited secondary to medical complications (Comment);Patient limited by lethargy;Patient limited by fatigue;Patient limited by pain Patient left: in bed;with bed alarm set   PT Visit Diagnosis: Other abnormalities of gait and mobility (R26.89);Muscle weakness (generalized) (M62.81)    Time: 5885-0277 PT Time Calculation (min) (ACUTE ONLY): 33 min   Charges:   PT Evaluation $PT Eval High Complexity: 1 High          Horald Chestnut, PT   Delford Field 12/08/2018, 2:30 PM

## 2018-12-08 NOTE — Progress Notes (Signed)
Spoke with patient's husband, updated him on patient's condition and answered any questions he had.

## 2018-12-08 NOTE — Progress Notes (Signed)
Patient deferred family update phone call. 

## 2018-12-08 NOTE — Progress Notes (Signed)
PHARMACY - PHYSICIAN COMMUNICATION CRITICAL VALUE ALERT - BLOOD CULTURE IDENTIFICATION (BCID)  April [name] is an 68 y.o. female who presented to [facility] on 12/06/2018 with a chief complaint of COVID-19 pneumonia.  Assessment:  1 of 4 bottles with gram positive cocci (no BCID will be run d/t backorder), possible contaminant (include suspected source if known)  Name of physician (or Provider) Contacted: Dr. Cathlean Sauer  Current antibiotics: none (remdesivir for COVID)  Changes to prescribed antibiotics recommended: hold antibiotics for now  No results found for this or any previous visit.  Peggyann Juba, PharmD, BCPS Pharmacy: [phone] 12/08/2018  6:01 PM

## 2018-12-08 NOTE — NC FL2 (Signed)
[place] MEDICAID FL2 LEVEL OF CARE SCREENING TOOL     IDENTIFICATION  Patient Name: April [name] Birthdate: Dec 25, 1950 Sex: female Admission Date (Current Location): 12/06/2018  [facility] and [place] Number:  Herbalist and Address:  [facility], [place] [address], [place], [place] [address]      Provider Number: [id number]  Attending Physician Name and Address:  Tawni Millers  Relative Name and Phone Number:       Current Level of Care: Hospital Recommended Level of Care: [place] Prior Approval Number:    Date Approved/Denied: 04/15/15 PASRR Number: [id number]  Discharge Plan: SNF    Current Diagnoses: Patient Active Problem List   Diagnosis Date Noted  . COVID-19 virus infection 12/06/2018  . Chronic venous hypertension (idiopathic) with ulcer of right lower extremity (CODE) ([place]) 11/09/2018  . UTI (urinary tract infection) 11/09/2018  . Right hip pain 11/09/2018  . Achilles tendon sprain, left, initial encounter   . Osteomyelitis of ankle or foot, acute, left ([place]) 09/07/2018  . Osteomyelitis of ankle or foot, left, acute ([place]) 09/07/2018  . Acute on chronic kidney failure ([place]) 08/15/2018  . Onychomycosis 05/19/2018  . Idiopathic chronic venous hypertension of right lower extremity with ulcer and inflammation ([place]) 05/19/2018  . Traumatic wound dehiscence   . Skin lumps 01/14/2018  . Bilateral leg cramps 12/14/2017  . Left shoulder pain 08/14/2016  . Meralgia paresthetica 07/16/2016  . Chronic left-sided low back pain with left-sided sciatica 06/02/2016  . AKI (acute kidney injury) ([place]) 03/28/2016  . Whole body pain 03/20/2016  . Diabetic peripheral neuropathy ([place]) 03/20/2016  . Osteopenia 01/11/2016  . CKD (chronic kidney disease) stage 3, GFR 30-59 ml/min (HCC) 01/02/2016  . Hand paresthesia 07/18/2015  . Carpal tunnel syndrome 06/07/2015  . Family history of colon cancer   . Diabetes  mellitus with neurological manifestations ([place]) 04/18/2015  . Bilateral leg edema 04/11/2015  . Abnormality of gait 01/03/2015  . Hereditary and idiopathic peripheral neuropathy 01/03/2015  . GERD (gastroesophageal reflux disease) 06/17/2014  . Hx of colonic polyps 12/15/2012  . Vitamin B12 deficiency 11/03/2012  . Intrinsic asthma 03/23/2012  . Chronic diastolic heart failure ([place]) 02/20/2011  . OSA (obstructive sleep apnea) 09/17/2010  . URINARY URGENCY 01/08/2010  . CAD, NATIVE VESSEL 11/20/2008  . Osteoarthritis of knees, bilateral 06/14/2008  . Hyperlipidemia 05/10/2007  . Essential hypertension 01/18/2007  . Hypokalemia 04/30/2006  . Morbid (severe) obesity due to excess calories ([place]) 04/30/2006  . HX, PERSONAL, PEPTIC ULCER DISEASE 04/30/2006    Orientation RESPIRATION BLADDER Height & Weight     Time, Situation, Place  O2 Continent Weight: (!) 160.1 kg Height:  5\' 4"  (162.6 cm)  BEHAVIORAL SYMPTOMS/MOOD NEUROLOGICAL BOWEL NUTRITION STATUS      Continent Diet(see DC orders/ note)  AMBULATORY STATUS COMMUNICATION OF NEEDS Skin   Extensive Assist Verbally Other (Comment)(R leg wound)                       Personal Care Assistance Level of Assistance  Bathing, Feeding, Dressing Bathing Assistance: Maximum assistance Feeding assistance: Independent Dressing Assistance: Limited assistance     Functional Limitations Info  Sight, Hearing, Speech Sight Info: Adequate Hearing Info: Adequate Speech Info: Adequate    SPECIAL CARE FACTORS FREQUENCY  PT (By licensed PT), OT (By licensed OT)     PT Frequency: 5x week OT Frequency: 5x week  Contractures Contractures Info: Not present    Additional Factors Info  Allergies, Code Status Code Status Info: Full code Allergies Info: See DC summary           Current Medications (12/08/2018):  This is the current hospital active medication list Current Facility-Administered Medications  Medication  Dose Route Frequency Provider Last Rate Last Dose  . 0.9 %  sodium chloride infusion  250 mL Intravenous PRN Jani Gravel, MD      . albuterol (VENTOLIN HFA) 108 (90 Base) MCG/ACT inhaler 2 puff  2 puff Inhalation Q6H PRN Jani Gravel, MD      . allopurinol (ZYLOPRIM) tablet 100 mg  100 mg Oral Daily Jani Gravel, MD   100 mg at 12/07/18 0941  . aspirin EC tablet 81 mg  81 mg Oral Loma Sousa, MD   81 mg at 12/07/18 2133  . calcium carbonate (TUMS - dosed in mg elemental calcium) chewable tablet 400 mg of elemental calcium  2 tablet Oral Daily PRN Jani Gravel, MD      . carvedilol (COREG) tablet 25 mg  25 mg Oral Q breakfast Jani Gravel, MD   25 mg at 12/08/18 2952  . colchicine tablet 0.6 mg  0.6 mg Oral Daily Jani Gravel, MD   0.6 mg at 12/07/18 0947  . dexamethasone (DECADRON) injection 6 mg  6 mg Intravenous Q24H Jani Gravel, MD   6 mg at 12/07/18 2134  . enoxaparin (LOVENOX) injection 75 mg  75 mg Subcutaneous QHS Tawni Millers, MD   75 mg at 12/07/18 2147  . famotidine (PEPCID) tablet 20 mg  20 mg Oral BID Jani Gravel, MD   20 mg at 12/07/18 2133  . ferrous sulfate tablet 325 mg  325 mg Oral Q breakfast Jani Gravel, MD   325 mg at 12/08/18 8413  . folic acid (FOLVITE) tablet 0.5 mg  500 mcg Oral QHS Jani Gravel, MD   0.5 mg at 12/07/18 2133  . furosemide (LASIX) tablet 120 mg  120 mg Oral BID Jani Gravel, MD   120 mg at 12/08/18 0853  . gabapentin (NEURONTIN) capsule 100 mg  100 mg Oral TID Jani Gravel, MD   100 mg at 12/07/18 2133  . mometasone-formoterol (DULERA) 200-5 MCG/ACT inhaler 2 puff  2 puff Inhalation BID Jani Gravel, MD   2 puff at 12/08/18 [phone]  . montelukast (SINGULAIR) tablet 10 mg  10 mg Oral Loma Sousa, MD   10 mg at 12/07/18 2133  . Oxcarbazepine (TRILEPTAL) tablet 300 mg  300 mg Oral BID Jani Gravel, MD   300 mg at 12/07/18 2134  . potassium chloride SA (KLOR-CON) CR tablet 20 mEq  20 mEq Oral TID Jani Gravel, MD   20 mEq at 12/07/18 2133  . remdesivir 100 mg in sodium  chloride 0.9 % 250 mL IVPB  100 mg Intravenous Q24H Jani Gravel, MD   Stopped at 12/07/18 1758  . simvastatin (ZOCOR) tablet 40 mg  40 mg Oral Loma Sousa, MD   40 mg at 12/07/18 2132  . sodium chloride flush (NS) 0.9 % injection 3 mL  3 mL Intravenous Q12H Jani Gravel, MD   3 mL at 12/07/18 2150  . sodium chloride flush (NS) 0.9 % injection 3 mL  3 mL Intravenous PRN Jani Gravel, MD      . spironolactone (ALDACTONE) tablet 25 mg  25 mg Oral Daily Jani Gravel, MD   25 mg at 12/07/18 0946  . traMADol Veatrice Bourbon)  tablet 50 mg  50 mg Oral Q6H PRN Jani Gravel, MD         Discharge Medications: Please see discharge summary for a list of discharge medications.  Relevant Imaging Results:  Relevant Lab Results:   Additional Information [id number]  Carles Collet, RN

## 2018-12-08 NOTE — Progress Notes (Addendum)
PROGRESS NOTE    April [name]  CBJ:[id number] DOB: [birth year] DOA: 12/06/2018 PCP: [name], MD    Brief Narrative:  68 year old female who presented with dyspnea.  She does have significant past medical history for hypertension, dyslipidemia, type 2 diabetes mellitus, chronic kidney disease stage III, coronary artery disease, asthma and neuropathy.  Patient reported a dry cough, fever and dyspnea for about 4 days, positive sick contacts at the SNF.  On her initial physical examination her temperature was 102.1, heart rate 93, respirate 17, blood pressure 143/81, oxygen saturation 98%.  Her lungs had bibasilar had rales, no wheezing, heart S1-S2 present rhythm, abdomen soft, no lower extremity edema. Sodium 138, potassium 4.1, chloride 97, bicarb 31, glucose 121, BUN 28, creatinine 1.58, white count 4.6, hemoglobin 13.0, hematocrit 42.7, platelets 106.  Her chest radiograph had very mild interstitial infiltrates, left lower lobe, right upper lobe.  EKG 92 bpm, normal axis, normal intervals, sinus rhythm, no ST segment changes, negative T waves V1 to V3.  Patient was admitted to the hospital with a working diagnosis of SARS COVID-19 viral pneumonia.   Assessment & Plan:   Principal Problem:   COVID-19 virus infection Active Problems:   Hyperlipidemia   Morbid (severe) obesity due to excess calories (HCC)   Essential hypertension   Osteoarthritis of knees, bilateral   OSA (obstructive sleep apnea)   Chronic diastolic heart failure ([place])   1. Acute hypoxic respiratory failure due to SARS COVID 19 viral pneumonia. Patient continue to improve dyspnea.  RR: 18  Pulse oxymetry: 93%  Fi02: 21% room air.   COVID-19 Labs  Recent Labs    12/06/18 1955  DDIMER 2.19*  FERRITIN 94  LDH 219*  CRP 3.6*    Follow inflammatory markers.   Tolerating well  Remdesivir #3/5 continue with systemic steroids with dexamethasone IV. Antitussive with Guaifenesin DM, chlorpheniramine,  and airway clearing techniques with flutter valve and incentive spirometer. Bronchodilator therapy with albuterol. Follow with physical therapy recommendations.   2. Chronic diastolic heart failure. Continue with carvedilol, and diuresis with furosemide and spironolactone.Clinically with no signs of exacerbation.   3. CKD stage 3. Stable renal function with serum cr at 1.56 with K at 4,4 and serum bicarbonate at 29. Will continue diuresis and follow with renal panel in am.   4. T2DM with neuropathy and dyslipidemia.  Fasting glucose is 188 this am, will continue glucose cover and monitoring with insulin sliding scale. On gabapentin and trileptal for neuropathy. On statin therapy.    5. Asthma. No clinical signs of acute exacerbation.   6. Morbid obesity with ambulatory dysfunction. Her BMI is 60.5   DVT prophylaxis: enoxaparin   Code Status:  full Family Communication: I spoke over the phone with the patient's husband about patient's  condition, plan of care and all questions were addressed.   Disposition Plan/ discharge barriers: pending clinical improvement.     Body mass index is 60.58 kg/m. Malnutrition Type:      Malnutrition Characteristics:      Nutrition Interventions:     RN Pressure Injury Documentation:     Consultants:     Procedures:     Antimicrobials:       Subjective: Patient continue to be very weak and deconditioned, persistent cough, productive, no nausea or vomiting, dyspnea with movement.   Objective: Vitals:   12/07/18 1933 12/08/18 0500 12/08/18 0557 12/08/18 0800  BP: (!) 109/56  132/76 128/76  Pulse:   73 71  Resp:  17 18  Temp: 99.3 F (37.4 C)  99.4 F (37.4 C) 99 F (37.2 C)  TempSrc: Oral  Oral Oral  SpO2:   95% 93%  Weight:  (!) 160.1 kg    Height:        Intake/Output Summary (Last 24 hours) at 12/08/2018 8099 Last data filed at 12/08/2018 0600 Gross per 24 hour  Intake 1033 ml  Output 1850 ml  Net -817  ml   Filed Weights   12/07/18 0351 12/08/18 0500  Weight: (!) 153.6 kg (!) 160.1 kg    Examination:   General: Not in pain or dyspnea, deconditioned  Neurology: Awake and alert, non focal  E IPJ:ASNK  pallor, no icterus, oral mucosa moist Cardiovascular: No JVD. S1-S2 present, rhythmic, no gallops, rubs, or murmurs. No lower extremity edema. Pulmonary: positive breath sounds bilaterally. Gastrointestinal. Abdomen with, no organomegaly, non tender, no rebound or guarding Skin. No rashes Musculoskeletal: no joint deformities     Data Reviewed: I have personally reviewed following labs and imaging studies  CBC: Recent Labs  Lab 12/06/18 1955 12/07/18 0606 12/08/18 0420  WBC 4.6 3.2* 2.7*  NEUTROABS 2.7 2.8 1.8  HGB 13.0 12.4 12.3  HCT 42.7 41.9 40.9  MCV 96.6 95.7 95.6  PLT 106* 101* 539*   Basic Metabolic Panel: Recent Labs  Lab 12/06/18 1955 12/07/18 0606 12/08/18 0420  NA 138 138 141  K 4.1 4.4 4.4  CL 97* 98 101  CO2 31 30 29   GLUCOSE 121* 185* 188*  BUN 28* 32* 34*  CREATININE 1.58* 1.81* 1.56*  CALCIUM 8.6* 8.6* 8.6*   GFR: Estimated Creatinine Clearance: 52.8 mL/min (A) (by C-G formula based on SCr of 1.56 mg/dL (H)). Liver Function Tests: Recent Labs  Lab 12/06/18 1955 12/07/18 0606 12/08/18 0420  AST 31 31 35  ALT 25 25 30   ALKPHOS 73 74 68  BILITOT 0.6 0.8 0.6  PROT 6.3* 6.5 6.2*  ALBUMIN 3.3* 3.2* 3.1*   No results for input(s): LIPASE, AMYLASE in the last 168 hours. No results for input(s): AMMONIA in the last 168 hours. Coagulation Profile: No results for input(s): INR, PROTIME in the last 168 hours. Cardiac Enzymes: No results for input(s): CKTOTAL, CKMB, CKMBINDEX, TROPONINI in the last 168 hours. BNP (last 3 results) No results for input(s): PROBNP in the last 8760 hours. HbA1C: No results for input(s): HGBA1C in the last 72 hours. CBG: Recent Labs  Lab 12/07/18 2142 12/08/18 0812  GLUCAP 127* 151*   Lipid Profile:  Recent Labs    12/06/18 1955  TRIG 107   Thyroid Function Tests: No results for input(s): TSH, T4TOTAL, FREET4, T3FREE, THYROIDAB in the last 72 hours. Anemia Panel: Recent Labs    12/06/18 1955  FERRITIN 94      Radiology Studies: I have reviewed all of the imaging during this hospital visit personally     Scheduled Meds: . allopurinol  100 mg Oral Daily  . aspirin EC  81 mg Oral QHS  . carvedilol  25 mg Oral Q breakfast  . colchicine  0.6 mg Oral Daily  . dexamethasone (DECADRON) injection  6 mg Intravenous Q24H  . enoxaparin (LOVENOX) injection  75 mg Subcutaneous QHS  . famotidine  20 mg Oral BID  . ferrous sulfate  325 mg Oral Q breakfast  . folic acid  767 mcg Oral QHS  . furosemide  120 mg Oral BID  . gabapentin  100 mg Oral TID  . mometasone-formoterol  2 puff Inhalation  BID  . montelukast  10 mg Oral QHS  . Oxcarbazepine  300 mg Oral BID  . potassium chloride SA  20 mEq Oral TID  . simvastatin  40 mg Oral QHS  . sodium chloride flush  3 mL Intravenous Q12H  . spironolactone  25 mg Oral Daily   Continuous Infusions: . sodium chloride    . remdesivir 100 mg in NS 250 mL Stopped (12/07/18 1758)     LOS: 2 days        Mauricio Gerome Apley, MD

## 2018-12-09 LAB — COMPREHENSIVE METABOLIC PANEL
ALT: 35 U/L (ref 0–44)
AST: 43 U/L — ABNORMAL HIGH (ref 15–41)
Albumin: 3.1 g/dL — ABNORMAL LOW (ref 3.5–5.0)
Alkaline Phosphatase: 70 U/L (ref 38–126)
Anion gap: 13 (ref 5–15)
BUN: 36 mg/dL — ABNORMAL HIGH (ref 8–23)
CO2: 29 mmol/L (ref 22–32)
Calcium: 8.2 mg/dL — ABNORMAL LOW (ref 8.9–10.3)
Chloride: 99 mmol/L (ref 98–111)
Creatinine, Ser: 1.54 mg/dL — ABNORMAL HIGH (ref 0.44–1.00)
GFR calc Af Amer: 40 mL/min — ABNORMAL LOW (ref >60)
GFR calc non Af Amer: 34 mL/min — ABNORMAL LOW (ref >60)
Glucose, Bld: 150 mg/dL — ABNORMAL HIGH (ref 70–99)
Potassium: 3.8 mmol/L (ref 3.5–5.1)
Sodium: 141 mmol/L (ref 135–145)
Total Bilirubin: 0.7 mg/dL (ref 0.3–1.2)
Total Protein: 6 g/dL — ABNORMAL LOW (ref 6.5–8.1)

## 2018-12-09 LAB — GLUCOSE, CAPILLARY
Glucose-Capillary: 196 mg/dL — ABNORMAL HIGH (ref 70–99)
Glucose-Capillary: 135 mg/dL — ABNORMAL HIGH (ref 70–99)
Glucose-Capillary: 134 mg/dL — ABNORMAL HIGH (ref 70–99)
Glucose-Capillary: 119 mg/dL — ABNORMAL HIGH (ref 70–99)

## 2018-12-09 LAB — CBC WITH DIFFERENTIAL/PLATELET
Abs Immature Granulocytes: 0.01 10*3/uL (ref 0.00–0.07)
Basophils Absolute: 0 10*3/uL (ref 0.0–0.1)
Basophils Relative: 1 %
Eosinophils Absolute: 0 10*3/uL (ref 0.0–0.5)
Eosinophils Relative: 0 %
HCT: 38.5 % (ref 36.0–46.0)
Hemoglobin: 11.6 g/dL — ABNORMAL LOW (ref 12.0–15.0)
Immature Granulocytes: 0 %
Lymphocytes Relative: 25 %
Lymphs Abs: 1.1 10*3/uL (ref 0.7–4.0)
MCH: 28.9 pg (ref 26.0–34.0)
MCHC: 30.1 g/dL (ref 30.0–36.0)
MCV: 95.8 fL (ref 80.0–100.0)
Monocytes Absolute: 0.3 10*3/uL (ref 0.1–1.0)
Monocytes Relative: 6 %
Neutro Abs: 3 10*3/uL (ref 1.7–7.7)
Neutrophils Relative %: 68 %
Platelets: 120 10*3/uL — ABNORMAL LOW (ref 150–400)
RBC: 4.02 MIL/uL (ref 3.87–5.11)
RDW: 15 % (ref 11.5–15.5)
WBC: 4.3 10*3/uL (ref 4.0–10.5)
nRBC: 0 % (ref 0.0–0.2)

## 2018-12-09 LAB — CULTURE, BLOOD (ROUTINE X 2)

## 2018-12-09 LAB — FERRITIN: Ferritin: 93 ng/mL (ref 11–307)

## 2018-12-09 LAB — C-REACTIVE PROTEIN: CRP: 2.1 mg/dL — ABNORMAL HIGH (ref <1.0)

## 2018-12-09 LAB — D-DIMER, QUANTITATIVE: D-Dimer, Quant: 1.62 ug/mL-FEU — ABNORMAL HIGH (ref 0.00–0.50)

## 2018-12-09 MED ORDER — GUAIFENESIN-DM 100-10 MG/5ML PO SYRP
10.0000 mL | ORAL_SOLUTION | Freq: Four times a day (QID) | ORAL | Status: DC | PRN
  Administered 2018-12-09: 16:00:00 10 mL via ORAL
  Filled 2018-12-09: qty 10

## 2018-12-09 MED ORDER — HYDROCOD POLST-CPM POLST ER 10-8 MG/5ML PO SUER
5.0000 mL | Freq: Two times a day (BID) | ORAL | Status: DC
  Administered 2018-12-09 – 2018-12-10 (×3): 5 mL via ORAL
  Filled 2018-12-09 (×3): qty 5

## 2018-12-09 NOTE — Progress Notes (Signed)
Attempted to call patient's husband but no one answered, I left a message for him to call me back for an update.

## 2018-12-09 NOTE — Progress Notes (Addendum)
PROGRESS NOTE    April [name]  YQM:[id number] DOB: 1950-02-11 DOA: 12/06/2018 PCP: [name], MD    Brief Narrative:  68 year old female who presented with dyspnea. She does have significant past medical history for hypertension, dyslipidemia, type 2 diabetes mellitus, chronic kidney disease stage III, coronary artery disease, asthma and neuropathy. Patient reported a dry cough, fever and dyspnea for about 4 days, positive sick contacts at the SNF. On her initial physical examination her temperature was 102.1, heart rate 93, respirate 17, blood pressure 143/81, oxygen saturation 98%. Her lungs had bibasilar had rales, no wheezing, heart S1-S2 present rhythm, abdomen soft, no lower extremity edema. Sodium 138, potassium 4.1, chloride 97, bicarb 31, glucose 121, BUN 28, creatinine 1.58, white count 4.6, hemoglobin 13.0, hematocrit 42.7, platelets 106. Her chest radiograph had very mild interstitial infiltrates, left lower lobe, right upper lobe.EKG 92 bpm, normal axis, normal intervals, sinus rhythm, no ST segment changes, negative T waves V1 to V3.  Patient was admitted to the hospital with a working diagnosis of SARS COVID-19 viral pneumonia.   Assessment & Plan:   Principal Problem:   COVID-19 virus infection Active Problems:   Hyperlipidemia   Morbid (severe) obesity due to excess calories (HCC)   Essential hypertension   Osteoarthritis of knees, bilateral   OSA (obstructive sleep apnea)   Chronic diastolic heart failure ([place])   1. Acute hypoxic respiratory failure due to SARS COVID 19 viral pneumonia. Patient continue to have dyspnea on exertion and productive cough.   RR: 18  Pulse oxymetry: 92%  Fi02: 21% room air.   COVID-19 Labs  Recent Labs    12/06/18 1955 12/09/18 0005  DDIMER 2.19* 1.62*  FERRITIN 94 93  LDH 219*  --   CRP 3.6* 2.1*    Inflammatory markers are improving.  Continue medical therapy with Remdesivir #4/5 continue with systemic  steroids with dexamethasone IV. Continue with antitussive with Guaifenesin DM, chlorpheniramine, and airway clearing techniques with flutter valve and incentive spirometer. On bronchodilator therapy with albuterol. Patient will need to continue physical therapy at SNF due to generalized deconditioning.   2. Chronic diastolic heart failure. No signs of exacerbayion On carvedilol and diuretic regimen with furosemide and spironolactone.  3. CKD stage 3 with hypokalemia. Serum stable at cr at 1.56 with K at 3,8 and serum bicarbonate at 29. On high doses of furosemide with good toleration. Continue with K supplementation.   4. T2DM with neuropathy and dyslipidemia.  Fasting glucose is 150 this am. Glucose cover and monitoring with insulin sliding scale. Continue with gabapentin and trileptal for neuropathy. Continue with statin therapy.   5. Asthma. No acute exacerbation.Continue with dulera and montelukast.  6. Morbid obesity with ambulatory dysfunction. Her calculated BMI is 60.5   DVT prophylaxis:enoxaparin Code Status:full Family Communication:no family at the bedside.  Disposition Plan/ discharge barriers:plan to return to SNF in am.   Body mass index is 60.58 kg/m. Malnutrition Type:      Malnutrition Characteristics:      Nutrition Interventions:     RN Pressure Injury Documentation:     Consultants:     Procedures:     Antimicrobials:       Subjective: Patient is feeling better but continue to have productive cough, no wheezing or chest pain. Limited mobility.    Objective: Vitals:   12/08/18 0557 12/08/18 0800 12/08/18 1619 12/09/18 0751  BP: 132/76 128/76 123/63 137/69  Pulse: 73 71 73 70  Resp: 17 18 18  18  Temp: 99.4 F (37.4 C) 99 F (37.2 C) 98.5 F (36.9 C) 98.4 F (36.9 C)  TempSrc: Oral Oral Oral Oral  SpO2: 95% 93% 95% 92%  Weight:      Height:        Intake/Output Summary (Last 24 hours) at 12/09/2018 0846 Last  data filed at 12/09/2018 0500 Gross per 24 hour  Intake 500 ml  Output 1100 ml  Net -600 ml   Filed Weights   12/07/18 0351 12/08/18 0500  Weight: (!) 153.6 kg (!) 160.1 kg    Examination:   General: Not in pain or dyspnea, deconditioned  Neurology: Awake and alert, non focal  E ENT: mild pallor, no icterus, oral mucosa moist Cardiovascular: No JVD. S1-S2 present, rhythmic, no gallops, rubs, or murmurs. No lower extremity edema. Pulmonary:  positive breath sounds bilaterally.  Gastrointestinal. Abdomen protuberant with no organomegaly, non tender, no rebound or guarding Skin. No rashes Musculoskeletal: no joint deformities     Data Reviewed: I have personally reviewed following labs and imaging studies  CBC: Recent Labs  Lab 12/06/18 1955 12/07/18 0606 12/08/18 0420 12/09/18 0005  WBC 4.6 3.2* 2.7* 4.3  NEUTROABS 2.7 2.8 1.8 3.0  HGB 13.0 12.4 12.3 11.6*  HCT 42.7 41.9 40.9 38.5  MCV 96.6 95.7 95.6 95.8  PLT 106* 101* 102* 810*   Basic Metabolic Panel: Recent Labs  Lab 12/06/18 1955 12/07/18 0606 12/08/18 0420 12/09/18 0005  NA 138 138 141 141  K 4.1 4.4 4.4 3.8  CL 97* 98 101 99  CO2 31 30 29 29   GLUCOSE 121* 185* 188* 150*  BUN 28* 32* 34* 36*  CREATININE 1.58* 1.81* 1.56* 1.54*  CALCIUM 8.6* 8.6* 8.6* 8.2*   GFR: Estimated Creatinine Clearance: 53.5 mL/min (A) (by C-G formula based on SCr of 1.54 mg/dL (H)). Liver Function Tests: Recent Labs  Lab 12/06/18 1955 12/07/18 0606 12/08/18 0420 12/09/18 0005  AST 31 31 35 43*  ALT 25 25 30  35  ALKPHOS 73 74 68 70  BILITOT 0.6 0.8 0.6 0.7  PROT 6.3* 6.5 6.2* 6.0*  ALBUMIN 3.3* 3.2* 3.1* 3.1*   No results for input(s): LIPASE, AMYLASE in the last 168 hours. No results for input(s): AMMONIA in the last 168 hours. Coagulation Profile: No results for input(s): INR, PROTIME in the last 168 hours. Cardiac Enzymes: No results for input(s): CKTOTAL, CKMB, CKMBINDEX, TROPONINI in the last 168 hours. BNP  (last 3 results) No results for input(s): PROBNP in the last 8760 hours. HbA1C: No results for input(s): HGBA1C in the last 72 hours. CBG: Recent Labs  Lab 12/08/18 0812 12/08/18 1139 12/08/18 1618 12/08/18 2233 12/09/18 0750  GLUCAP 151* 179* 108* 101* 196*   Lipid Profile: Recent Labs    12/06/18 1955  TRIG 107   Thyroid Function Tests: No results for input(s): TSH, T4TOTAL, FREET4, T3FREE, THYROIDAB in the last 72 hours. Anemia Panel: Recent Labs    12/06/18 1955 12/09/18 0005  FERRITIN 94 93      Radiology Studies: I have reviewed all of the imaging during this hospital visit personally     Scheduled Meds: . allopurinol  100 mg Oral Daily  . aspirin EC  81 mg Oral QHS  . carvedilol  25 mg Oral Q breakfast  . colchicine  0.6 mg Oral Daily  . dexamethasone (DECADRON) injection  6 mg Intravenous Q24H  . enoxaparin (LOVENOX) injection  75 mg Subcutaneous QHS  . famotidine  20 mg Oral BID  . ferrous  sulfate  325 mg Oral Q breakfast  . folic acid  582 mcg Oral QHS  . furosemide  120 mg Oral BID  . gabapentin  100 mg Oral TID  . mometasone-formoterol  2 puff Inhalation BID  . montelukast  10 mg Oral QHS  . Oxcarbazepine  300 mg Oral BID  . potassium chloride SA  20 mEq Oral TID  . simvastatin  40 mg Oral QHS  . sodium chloride flush  3 mL Intravenous Q12H  . spironolactone  25 mg Oral Daily   Continuous Infusions: . sodium chloride    . remdesivir 100 mg in NS 250 mL 100 mg (12/08/18 1604)     LOS: 3 days        Joby Hershkowitz Gerome Apley, MD

## 2018-12-10 DIAGNOSIS — J1289 Other viral pneumonia: Secondary | ICD-10-CM

## 2018-12-10 DIAGNOSIS — U071 COVID-19: Secondary | ICD-10-CM

## 2018-12-10 LAB — C-REACTIVE PROTEIN: CRP: 1.5 mg/dL — ABNORMAL HIGH (ref <1.0)

## 2018-12-10 LAB — GLUCOSE, CAPILLARY
Glucose-Capillary: 186 mg/dL — ABNORMAL HIGH (ref 70–99)
Glucose-Capillary: 157 mg/dL — ABNORMAL HIGH (ref 70–99)
Glucose-Capillary: 139 mg/dL — ABNORMAL HIGH (ref 70–99)

## 2018-12-10 LAB — COMPREHENSIVE METABOLIC PANEL
ALT: 38 U/L (ref 0–44)
AST: 38 U/L (ref 15–41)
Albumin: 3 g/dL — ABNORMAL LOW (ref 3.5–5.0)
Alkaline Phosphatase: 70 U/L (ref 38–126)
Anion gap: 12 (ref 5–15)
BUN: 29 mg/dL — ABNORMAL HIGH (ref 8–23)
CO2: 29 mmol/L (ref 22–32)
Calcium: 8.4 mg/dL — ABNORMAL LOW (ref 8.9–10.3)
Chloride: 100 mmol/L (ref 98–111)
Creatinine, Ser: 1.25 mg/dL — ABNORMAL HIGH (ref 0.44–1.00)
GFR calc Af Amer: 51 mL/min — ABNORMAL LOW (ref >60)
GFR calc non Af Amer: 44 mL/min — ABNORMAL LOW (ref >60)
Glucose, Bld: 136 mg/dL — ABNORMAL HIGH (ref 70–99)
Potassium: 3.9 mmol/L (ref 3.5–5.1)
Sodium: 141 mmol/L (ref 135–145)
Total Bilirubin: 0.4 mg/dL (ref 0.3–1.2)
Total Protein: 6.2 g/dL — ABNORMAL LOW (ref 6.5–8.1)

## 2018-12-10 LAB — FERRITIN: Ferritin: 85 ng/mL (ref 11–307)

## 2018-12-10 LAB — D-DIMER, QUANTITATIVE: D-Dimer, Quant: 1.33 ug/mL-FEU — ABNORMAL HIGH (ref 0.00–0.50)

## 2018-12-10 MED ORDER — TRAMADOL HCL 50 MG PO TABS: 50.0000 mg | ORAL_TABLET | Freq: Four times a day (QID) | ORAL | 0 refills | Status: DC | PRN

## 2018-12-10 MED ORDER — GUAIFENESIN-DM 100-10 MG/5ML PO SYRP: 5.0000 mL | ORAL_SOLUTION | Freq: Four times a day (QID) | ORAL | 0 refills | Status: DC | PRN

## 2018-12-10 NOTE — TOC Transition Note (Signed)
Transition of Care [facility]) - CM/SW Discharge Note   Patient Details  Name: April [name] MRN: [id number] Date of Birth: October 10, 1950  Transition of Care [facility]) CM/SW Contact:  Kirstie Peri, [place] Work Phone Number: 12/10/2018, 11:46 AM   Clinical Narrative:    Nurse to call report to [phone]. Transport set for 2pm.   Final next level of care: [place] Barriers to Discharge: Barriers Resolved   Patient Goals and CMS Choice        Discharge Placement              Patient chooses bed at: [facility] Patient to be transferred to facility by: [place] Name of family member notified: Lupe Carney Patient and family notified of of transfer: 12/10/18  Discharge Plan and Services                                     Social Determinants of Health (SDOH) Interventions     Readmission Risk Interventions No flowsheet data found.

## 2018-12-10 NOTE — Discharge Instructions (Signed)

## 2018-12-10 NOTE — Discharge Summary (Signed)
Physician Discharge Summary  April [name] MWN:[id number] DOB: Dec 15, 1950 DOA: 12/06/2018  PCP: [name], MD  Admit date: 12/06/2018 Discharge date: 12/10/2018  Admitted From: SNF  Disposition:  SNF   Recommendations for Outpatient Follow-up and new medication changes:  1. Follow up with Dr. Quay Burow in 2 weeks.  2. Continue quarantine for 2 weeks, maintain physical distancing and use a mask in public.   Home Health: na   Equipment/Devices: na    Discharge Condition: stable  CODE STATUS: full  Diet recommendation: heart healthy and diabetic prudent.   Brief/Interim Summary: 68 year old female who presented with dyspnea. She does have significant past medical history for hypertension, dyslipidemia, type 2 diabetes mellitus, chronic kidney disease stage III, coronary artery disease, asthma and neuropathy. Patient reported a dry cough, fever and dyspnea for about 4 days, positive sick contacts at the SNF. On her initial physical examination her temperature was 102.1, heart rate 93, respiratory rate 17, blood pressure 143/81, oxygen saturation 91 to 98%. Her lungs had bibasilar rales, no wheezing, heart S1-S2 present rhythm, abdomen soft, no lower extremity edema. Sodium 138, potassium 4.1, chloride 97, bicarb 31, glucose 121, BUN 28, creatinine 1.58, white count 4.6, hemoglobin 13.0, hematocrit 42.7, platelets 106. Her chest radiograph had very mild interstitial infiltrates, left lower lobe, right upper lobe.EKG 92 bpm, normal axis, normal intervals, sinus rhythm, no ST segment changes, negative T waves V1 to V3.  Patient was admitted to the hospital with a working diagnosis of SARS COVID-19 viral pneumonia.  Patient responded well to medical therapy with remdesivir and systemic corticosteroids.  1.  Acute hypoxic respiratory failure (02 sat less than 94%), due to SARS COVID-19 viral pneumonia.  Patient was admitted to the medical ward, she received supplemental oxygen per nasal  cannula, remdesivir and intravenous corticosteroids with dexamethasone.  She also received antitussive agents, bronchodilators, and airway clearance techniques with flutter valve and incentive spirometer.  Her symptoms and inflammatory markers improved, her oxygen saturation at discharge is 95% on room air.   2.  Chronic diastolic heart failure.  No signs of clinical exacerbation, patient was continued on carvedilol and diuretic therapy with furosemide and spironolactone.  3.  Chronic kidney disease stage III b.  Patient was continued on furosemide and spironolactone, her kidney function remained stable, at discharge her creatinine is 1.25, potassium 3.9, bicarb 29, and sodium 141.  Continue outpatient follow-up of kidney function and electrolytes.  4.  Type 2 diabetes mellitus with neuropathy and dyslipidemia.  Patient received insulin sliding scale for glucose coverage monitoring, her capillary glucose remained stable.  Patient was continued on gabapentin and Trileptal for neuropathy.  Continue statin therapy.  5.  Asthma.  No signs of acute exacerbation, continue Dulera and montelukast.  6.  Morbid obesity with ambulatory dysfunction.  Her calculated BMI 60.5, she was seen by physical therapy, recommendations to continue physical therapy at SNF.   Discharge Diagnoses:  Principal Problem:   Pneumonia due to COVID-19 virus Active Problems:   Hyperlipidemia   Morbid (severe) obesity due to excess calories (HCC)   Essential hypertension   Osteoarthritis of knees, bilateral   OSA (obstructive sleep apnea)   Chronic diastolic heart failure ([place])   COVID-19 virus infection    Discharge Instructions   Allergies as of 12/10/2018      Reactions   Penicillins Rash, Other (See Comments)   She was told not to take it anymore. Has patient had a PCN reaction causing immediate rash, facial/tongue/throat swelling, SOB or  lightheadedness with hypotension: Yes Has patient had a PCN reaction causing  severe rash involving mucus membranes or skin necrosis: No Has patient had a PCN reaction that required hospitalization: No Has patient had a PCN reaction occurring within the last 10 years: No If all of the above answers are "NO", then may proceed with Cephalosporin use.'   Shellfish Allergy Anaphylaxis   Sulfonamide Derivatives Anaphylaxis, Other (See Comments)   REACTION: internal "burning"   Hydrochlorothiazide W-triamterene Other (See Comments)   Hypokalemia   Lotensin [benazepril Hcl] Hives   Dipyridamole Other (See Comments)   Unknown reaction   Estrogens Other (See Comments)   Unknown reaction   Hydrochlorothiazide Other (See Comments)   Unknown reaction   Metronidazole Other (See Comments)   Unknown reaction   Other    Pickles-itching   Spironolactone Other (See Comments)   UNSPECIFIED > "kidney problems"   Sulfa Antibiotics Other (See Comments)   Unknown reaction   Torsemide Other (See Comments)   Unknown reaction   Valsartan Other (See Comments)   Unknown reaction   Mustard [allyl Isothiocyanate] Itching      Medication List    STOP taking these medications   albuterol (2.5 MG/3ML) 0.083% nebulizer solution Commonly known as: PROVENTIL     TAKE these medications   acetaminophen 325 MG tablet Commonly known as: TYLENOL Take 2 tablets (650 mg total) by mouth every 6 (six) hours as needed for moderate pain (or Fever >/= 101).   allopurinol 100 MG tablet Commonly known as: ZYLOPRIM Take 1 tablet (100 mg total) by mouth daily.   budesonide-formoterol 160-4.5 MCG/ACT inhaler Commonly known as: Symbicort one - two inhalations every 12 hours; gargle and spit after use What changed:   how much to take  how to take this  when to take this  additional instructions   calcium carbonate 500 MG chewable tablet Commonly known as: TUMS - dosed in mg elemental calcium Chew 2 tablets by mouth daily as needed for indigestion or heartburn.   carvedilol 25 MG  tablet Commonly known as: COREG TAKE 1 TABLET BY MOUTH TWICE DAILY WITH A MEAL What changed: See the new instructions.   cholecalciferol 25 MCG (1000 UT) tablet Commonly known as: VITAMIN D3 Take 1,000 Units by mouth daily.   COD LIVER OIL PO Take 1 capsule by mouth daily.   colchicine 0.6 MG tablet Take 1 tablet (0.6 mg total) by mouth daily.   cyanocobalamin 1000 MCG/ML injection Commonly known as: (VITAMIN B-12) INJECT 1ML INTO MUSCLE EVERY 30 DAYS What changed:   how much to take  how to take this  when to take this  additional instructions   diclofenac sodium 1 % Gel Commonly known as: VOLTAREN Apply 2 g topically 4 (four) times daily.   Ecotrin Low Strength 81 MG EC tablet Generic drug: aspirin Take 81 mg by mouth at bedtime.   famotidine 20 MG tablet Commonly known as: Pepcid Take 1 tablet (20 mg total) by mouth 2 (two) times daily.   ferrous sulfate 325 (65 FE) MG tablet Take 1 tablet (325 mg total) by mouth daily with breakfast.   Fish Oil 1000 MG Caps Take 1,000 mg by mouth daily.   folic acid 619 MCG tablet Commonly known as: FOLVITE Take 400 mcg by mouth at bedtime.   furosemide 80 MG tablet Commonly known as: LASIX Take 1-1.5 tablets (80-120 mg total) by mouth 2 (two) times daily. 1.5 tablets in morning, 1.5 tablet in afternoon What changed:  how much to take  additional instructions   gabapentin 100 MG capsule Commonly known as: Neurontin Take 1 capsule (100 mg total) by mouth 3 (three) times daily.   guaiFENesin-dextromethorphan 100-10 MG/5ML syrup Commonly known as: ROBITUSSIN DM Take 5 mLs by mouth every 6 (six) hours as needed for cough.   montelukast 10 MG tablet Commonly known as: SINGULAIR Take 1 tablet (10 mg total) by mouth at bedtime.   Oxcarbazepine 300 MG tablet Commonly known as: TRILEPTAL Take 1 tablet (300 mg total) by mouth 2 (two) times daily.   potassium chloride SA 20 MEQ tablet Commonly known as:  KLOR-CON Take 20 mEq by mouth 3 (three) times daily.   Pulmicort Flexhaler 90 MCG/ACT inhaler Generic drug: Budesonide Inhale 2 puffs into the lungs 2 (two) times daily.   silver sulfADIAZINE 1 % cream Commonly known as: Silvadene Apply 1 application topically daily.   simvastatin 40 MG tablet Commonly known as: ZOCOR Take 1 tablet (40 mg total) by mouth at bedtime.   spironolactone 25 MG tablet Commonly known as: ALDACTONE TAKE 1 TABLET BY MOUTH ONCE DAILY   traMADol 50 MG tablet Commonly known as: ULTRAM Take 1 tablet (50 mg total) by mouth every 6 (six) hours as needed for moderate pain.      Contact information for after-discharge care    Destination    [facility] Preferred SNF .   Service: Skilled Nursing Contact information: [address] [address] [phone]             Allergies  Allergen Reactions  . Penicillins Rash and Other (See Comments)    She was told not to take it anymore. Has patient had a PCN reaction causing immediate rash, facial/tongue/throat swelling, SOB or lightheadedness with hypotension: Yes Has patient had a PCN reaction causing severe rash involving mucus membranes or skin necrosis: No Has patient had a PCN reaction that required hospitalization: No Has patient had a PCN reaction occurring within the last 10 years: No If all of the above answers are "NO", then may proceed with Cephalosporin use.'  . Shellfish Allergy Anaphylaxis  . Sulfonamide Derivatives Anaphylaxis and Other (See Comments)    REACTION: internal "burning"  . Hydrochlorothiazide W-Triamterene Other (See Comments)    Hypokalemia  . Lotensin [Benazepril Hcl] Hives  . Dipyridamole Other (See Comments)    Unknown reaction  . Estrogens Other (See Comments)    Unknown reaction  . Hydrochlorothiazide Other (See Comments)    Unknown reaction  . Metronidazole Other (See Comments)    Unknown reaction  . Other      Pickles-itching  . Spironolactone Other (See Comments)    UNSPECIFIED > "kidney problems"  . Sulfa Antibiotics Other (See Comments)    Unknown reaction  . Torsemide Other (See Comments)    Unknown reaction  . Valsartan Other (See Comments)    Unknown reaction  . Madelaine Bhat Isothiocyanate] Itching    Consultations:     Procedures/Studies: Dg Knee 1-2 Views Right  Result Date: 11/10/2018 CLINICAL DATA:  Chronic knee pain EXAM: RIGHT KNEE - 1-2 VIEW COMPARISON:  09/07/2018, 02/15/2018 FINDINGS: Cortex irregularity at the right fibular head neck is chronic. Advanced tricompartment arthritis with severe joint space narrowing, bony spurring, and subarticular sclerosis. Suspect small knee effusion. IMPRESSION: 1. No acute osseous abnormality 2. Advanced tricompartment arthritis with suspected knee effusion Electronically Signed   By: Donavan Foil M.D.   On: 11/10/2018 20:08   Dg Chest [facility] [address]  Result Date: 12/06/2018 CLINICAL DATA:  COVID positive, shortness of breath EXAM: PORTABLE CHEST 1 VIEW COMPARISON:  11/16/2018 FINDINGS: Heart is borderline enlarged. Mild vascular congestion. No confluent airspace opacities or effusions. No acute bony abnormality. IMPRESSION: Mild cardiomegaly.  Vascular congestion. Electronically Signed   By: Rolm Baptise M.D.   On: 12/06/2018 20:22   Dg Chest Port 1 View  Result Date: 11/16/2018 CLINICAL DATA:  68 year old female with history of shortness of breath. EXAM: PORTABLE CHEST 1 VIEW COMPARISON:  Chest x-ray 11/15/2018. FINDINGS: Exam is limited by under penetration of the study. Lung volumes are low. No confluent consolidative airspace disease. No definite pleural effusions. No pneumothorax. Pulmonary vasculature is very congestion. No frank pulmonary edema. Heart size appears mildly enlarged. Upper mediastinal contours are within normal limits. IMPRESSION: 1. Mild cardiomegaly with pulmonary venous congestion, without frank pulmonary edema.  Electronically Signed   By: Vinnie Langton M.D.   On: 11/16/2018 08:19   Dg Chest Port 1 View  Result Date: 11/15/2018 CLINICAL DATA:  Lower extremity venous hypertension and ulceration. EXAM: PORTABLE CHEST 1 VIEW COMPARISON:  11/14/2018 FINDINGS: Mild cardiomegaly. Chronic aortic atherosclerosis. Mild pulmonary venous hypertension without frank edema. No infiltrate, collapse or visible effusion. IMPRESSION: Mild cardiomegaly. Aortic atherosclerosis. Pulmonary venous hypertension without edema. Electronically Signed   By: Nelson Chimes M.D.   On: 11/15/2018 16:27   Dg Chest Port 1 View  Result Date: 11/14/2018 CLINICAL DATA:  68 year old female with shortness of breath. EXAM: PORTABLE CHEST 1 VIEW COMPARISON:  Chest radiograph dated 11/13/2018 FINDINGS: No focal consolidation, pleural effusion, pneumothorax. Stable mild cardiomegaly. There is mild vascular congestion. Atherosclerotic calcification of the aortic arch. No acute osseous pathology. IMPRESSION: Stable mild cardiomegaly with mild vascular congestion. No focal consolidation. Electronically Signed   By: Anner Crete M.D.   On: 11/14/2018 19:19   Dg Chest Port 1 View  Result Date: 11/13/2018 CLINICAL DATA:  Per order- SOB Pt HX: non smoker, DM, HTN, CHF EXAM: PORTABLE CHEST 1 VIEW COMPARISON:  11/12/2018 FINDINGS: Normal cardiac silhouette. Mild central venous pulmonary congestion similar prior. No focal consolidation. Small LEFT effusion. No pneumothorax. IMPRESSION: Central venous congestion and small LEFT effusion. No interval change. Electronically Signed   By: Suzy Bouchard M.D.   On: 11/13/2018 06:13   Dg Chest Port 1 View  Result Date: 11/12/2018 CLINICAL DATA:  Pre order- SOB EXAM: PORTABLE CHEST 1 VIEW COMPARISON:  11/11/2018 FINDINGS: Normal cardiac silhouette. Low lung volumes. Central venous congestion similar prior. No overt pulmonary edema. Mild linear interstitial markings cyst with mild interstitial edema.  IMPRESSION: 1. No interval change. 2. Central venous congestion and mild interstitial edema. 3. Low lung volumes. Electronically Signed   By: Suzy Bouchard M.D.   On: 11/12/2018 05:55   Dg Chest Port 1 View  Result Date: 11/11/2018 CLINICAL DATA:  68 year old female with history of shortness of breath. EXAM: PORTABLE CHEST 1 VIEW COMPARISON:  Chest x-ray 11/10/2018. FINDINGS: Lung volumes are low. No consolidative airspace disease. No pleural effusions. No pneumothorax. No pulmonary nodule or mass noted. Dilatation of the central pulmonary arteries, concerning for pulmonary arterial hypertension. No evidence of pulmonary edema. Heart size is borderline enlarged. Upper mediastinal contours are within normal limits. Aortic atherosclerosis. IMPRESSION: 1. Low lung volumes without radiographic evidence of acute cardiopulmonary disease. 2. Prominence of the central pulmonary arteries, concerning for pulmonary arterial hypertension. Electronically Signed   By: Vinnie Langton M.D.   On: 11/11/2018 08:48   Dg Chest [facility]  Result Date: 11/10/2018 CLINICAL DATA:  Shortness of breath and wheezing this morning. EXAM: PORTABLE CHEST 1 VIEW COMPARISON:  Chest x-ray 11/09/2018 FINDINGS: The heart is mildly enlarged but stable. There is tortuosity and calcification of the thoracic aorta. Mild central vascular prominence and vascular congestion but no overt pulmonary edema. Low lung volumes with vascular crowding and streaky basilar atelectasis. No definite infiltrates. Suspect small left effusion. IMPRESSION: Cardiac enlargement and central vascular congestion without overt pulmonary edema. Suspect small left pleural effusion. Electronically Signed   By: Marijo Sanes M.D.   On: 11/10/2018 09:20   Dg Foot 2 Views Right  Result Date: 11/10/2018 CLINICAL DATA:  Chronic pain EXAM: RIGHT FOOT - 2 VIEW COMPARISON:  None. FINDINGS: No acute displaced fracture or malalignment. Minimal degenerative change at the  first MTP joint. Moderate plantar calcaneal spur and posterior enthesophyte IMPRESSION: No acute osseous abnormality Electronically Signed   By: Donavan Foil M.D.   On: 11/10/2018 20:09   Vas [place] Lower Extremity Venous (dvt)  Result Date: 11/13/2018  Lower Venous Study Indications: Swelling, and Pain.  Limitations: Body habitus and poor ultrasound/tissue interface. Comparison Study: previous study done 08/15/18 Performing Technologist: Abram Sander RVS  Examination Guidelines: A complete evaluation includes B-mode imaging, spectral Doppler, color Doppler, and power Doppler as needed of all accessible portions of each vessel. Bilateral testing is considered an integral part of a complete examination. Limited examinations for reoccurring indications may be performed as noted.  +---------+---------------+---------+-----------+----------+--------------+ RIGHT    CompressibilityPhasicitySpontaneityPropertiesThrombus Aging +---------+---------------+---------+-----------+----------+--------------+ CFV      Full           Yes      Yes                                 +---------+---------------+---------+-----------+----------+--------------+ SFJ      Full                                                        +---------+---------------+---------+-----------+----------+--------------+ FV Prox  Full                                                        +---------+---------------+---------+-----------+----------+--------------+ FV Mid   Full                                                        +---------+---------------+---------+-----------+----------+--------------+ FV Distal                                             Not visualized +---------+---------------+---------+-----------+----------+--------------+ PFV      Full                                                        +---------+---------------+---------+-----------+----------+--------------+  POP      Full            Yes      Yes                                 +---------+---------------+---------+-----------+----------+--------------+ PTV                                                   Not visualized +---------+---------------+---------+-----------+----------+--------------+ PERO                                                  Not visualized +---------+---------------+---------+-----------+----------+--------------+   +---------+---------------+---------+-----------+----------+--------------+ LEFT     CompressibilityPhasicitySpontaneityPropertiesThrombus Aging +---------+---------------+---------+-----------+----------+--------------+ CFV      Full           Yes      Yes                                 +---------+---------------+---------+-----------+----------+--------------+ SFJ      Full                                                        +---------+---------------+---------+-----------+----------+--------------+ FV Prox  Full                                                        +---------+---------------+---------+-----------+----------+--------------+ FV Mid   Full                                                        +---------+---------------+---------+-----------+----------+--------------+ FV DistalFull                                                        +---------+---------------+---------+-----------+----------+--------------+ PFV      Full                                                        +---------+---------------+---------+-----------+----------+--------------+ POP      Full           Yes      Yes                                 +---------+---------------+---------+-----------+----------+--------------+ PTV  Not visualized +---------+---------------+---------+-----------+----------+--------------+ PERO                                                  Not  visualized +---------+---------------+---------+-----------+----------+--------------+     Summary: Right: There is no evidence of deep vein thrombosis in the lower extremity. However, portions of this examination were limited- see technologist comments above. No cystic structure found in the popliteal fossa. Left: There is no evidence of deep vein thrombosis in the lower extremity. However, portions of this examination were limited- see technologist comments above. No cystic structure found in the popliteal fossa.  *See table(s) above for measurements and observations. Electronically signed by Ruta Hinds MD on 11/13/2018 at 12:41:45 PM.    Final       Procedures:   Subjective: Patient continue to improve in her symptoms, no nausea or vomiting, no chest pain.   Discharge Exam: Vitals:   12/09/18 2021 12/10/18 0600  BP: 131/75 130/74  Pulse: 66 63  Resp: 20 20  Temp: 98.9 F (37.2 C) 98.1 F (36.7 C)  SpO2: 93% 93%   Vitals:   12/09/18 0751 12/09/18 1630 12/09/18 2021 12/10/18 0600  BP: 137/69 130/75 131/75 130/74  Pulse: 70 69 66 63  Resp: 18 20 20 20   Temp: 98.4 F (36.9 C) 98.3 F (36.8 C) 98.9 F (37.2 C) 98.1 F (36.7 C)  TempSrc: Oral Oral Oral Oral  SpO2: 92% 95% 93% 93%  Weight:      Height:        General: Not in pain or dyspnea  Neurology: Awake and alert, non focal  E ENT: no pallor, no icterus, oral mucosa moist Cardiovascular: No JVD. S1-S2 present, rhythmic, no gallops, rubs, or murmurs. No lower extremity edema. Pulmonary: positive breath sounds bilaterally. Gastrointestinal. Abdomen with no organomegaly, non tender, no rebound or guarding Skin. Right leg wound, clean with no erythema, healing.  Musculoskeletal: no joint deformities   The results of significant diagnostics from this hospitalization (including imaging, microbiology, ancillary and laboratory) are listed below for reference.     Microbiology: Recent Results (from the past 240  hour(s))  Blood Culture (routine x 2)     Status: Abnormal   Collection Time: 12/06/18  7:40 PM   Specimen: BLOOD  Result Value Ref Range Status   Specimen Description   Final    BLOOD RIGHT ANTECUBITAL Performed at [facility] [address] [place], [place] [address]    Special Requests   Final    BOTTLES DRAWN AEROBIC AND ANAEROBIC Blood Culture results may not be optimal due to an excessive volume of blood received in culture bottles Performed at [facility] [address], [place], [place] [address]    Culture  Setup Time   Final    GRAM POSITIVE COCCI AEROBIC BOTTLE ONLY Gram Stain Report Called to,Read Back By and Verified WithCristopher Estimable PHARMD 1749 12/08/18 A BROWNING    Culture (A)  Final    STAPHYLOCOCCUS SPECIES (COAGULASE NEGATIVE) THE SIGNIFICANCE OF ISOLATING THIS ORGANISM FROM A SINGLE SET OF BLOOD CULTURES WHEN MULTIPLE SETS ARE DRAWN IS UNCERTAIN. PLEASE NOTIFY THE MICROBIOLOGY DEPARTMENT WITHIN ONE WEEK IF SPECIATION AND SENSITIVITIES ARE REQUIRED. Performed at [facility], [place] [address] [place], [place] [address]    Report Status 12/09/2018 FINAL  Final  Blood Culture (  routine x 2)     Status: None (Preliminary result)   Collection Time: 12/06/18  7:47 PM   Specimen: BLOOD  Result Value Ref Range Status   Specimen Description   Final    BLOOD RIGHT WRIST Performed at [facility] [address], [place], [place] [address]    Special Requests   Final    BOTTLES DRAWN AEROBIC AND ANAEROBIC Blood Culture results may not be optimal due to an excessive volume of blood received in culture bottles Performed at [facility] [address] [place], [place] [address]    Culture   Final    NO GROWTH 3 DAYS Performed at [facility], [place] [address] [place], [place] [address]    Report Status PENDING  Incomplete  SARS Coronavirus 2 by RT PCR (hospital order, performed  in [facility] hospital lab) Nasopharyngeal Nasopharyngeal Swab     Status: Abnormal   Collection Time: 12/06/18  7:55 PM   Specimen: Nasopharyngeal Swab  Result Value Ref Range Status   SARS Coronavirus 2 POSITIVE (A) NEGATIVE Final    Comment: RESULT CALLED TO, READ BACK BY AND VERIFIED WITH: [facility] FARRAR @ 1012 ON 12/06/2018 C VARNER (NOTE) If result is NEGATIVE SARS-CoV-2 target nucleic acids are NOT DETECTED. The SARS-CoV-2 RNA is generally detectable in upper and lower  respiratory specimens during the acute phase of infection. The lowest  concentration of SARS-CoV-2 viral copies this assay can detect is 250  copies / mL. A negative result does not preclude SARS-CoV-2 infection  and should not be used as the sole basis for treatment or other  patient management decisions.  A negative result may occur with  improper specimen collection / handling, submission of specimen other  than nasopharyngeal swab, presence of viral mutation(s) within the  areas targeted by this assay, and inadequate number of viral copies  (<250 copies / mL). A negative result must be combined with clinical  observations, patient history, and epidemiological information. If result is POSITIVE SARS-CoV-2 target nucleic acids are DETECT ED. The SARS-CoV-2 RNA is generally detectable in upper and lower  respiratory specimens during the acute phase of infection.  Positive  results are indicative of active infection with SARS-CoV-2.  Clinical  correlation with patient history and other diagnostic information is  necessary to determine patient infection status.  Positive results do  not rule out bacterial infection or co-infection with other viruses. If result is PRESUMPTIVE POSTIVE SARS-CoV-2 nucleic acids MAY BE PRESENT.   A presumptive positive result was obtained on the submitted specimen  and confirmed on repeat testing.  While 2019 novel coronavirus  (SARS-CoV-2) nucleic acids may be present in the  submitted sample  additional confirmatory testing may be necessary for epidemiological  and / or clinical management purposes  to differentiate between  SARS-CoV-2 and other Sarbecovirus currently known to infect humans.  If clinically indicated additional testing with an alternate test  methodology (LAB74 53) is advised. The SARS-CoV-2 RNA is generally  detectable in upper and lower respiratory specimens during the acute  phase of infection. The expected result is Negative. Fact Sheet for Patients:  StrictlyIdeas.no Fact Sheet for Healthcare Providers: BankingDealers.co.za This test is not yet approved or cleared by the [place] FDA and has been authorized for detection and/or diagnosis of SARS-CoV-2 by FDA under an Emergency Use Authorization (EUA).  This EUA will remain in effect (meaning this test can be used) for the duration of the COVID-19 declaration  under Section 564(b)(1) of the Act, 21 U.S.C. section 360bbb-3(b)(1), unless the authorization is terminated or revoked sooner. Performed at [facility], [place] [address], [place], [place] [address]      Labs: BNP (last 3 results) Recent Labs    08/15/18 1019 11/09/18 1140 12/06/18 1955  BNP 23.6 84.9 60.4   Basic Metabolic Panel: Recent Labs  Lab 12/06/18 1955 12/07/18 0606 12/08/18 0420 12/09/18 0005 12/10/18 0024  NA 138 138 141 141 141  K 4.1 4.4 4.4 3.8 3.9  CL 97* 98 101 99 100  CO2 31 30 29 29 29   GLUCOSE 121* 185* 188* 150* 136*  BUN 28* 32* 34* 36* 29*  CREATININE 1.58* 1.81* 1.56* 1.54* 1.25*  CALCIUM 8.6* 8.6* 8.6* 8.2* 8.4*   Liver Function Tests: Recent Labs  Lab 12/06/18 1955 12/07/18 0606 12/08/18 0420 12/09/18 0005 12/10/18 0024  AST 31 31 35 43* 38  ALT 25 25 30  35 38  ALKPHOS 73 74 68 70 70  BILITOT 0.6 0.8 0.6 0.7 0.4  PROT 6.3* 6.5 6.2* 6.0* 6.2*  ALBUMIN 3.3* 3.2* 3.1* 3.1* 3.0*   No results for input(s):  LIPASE, AMYLASE in the last 168 hours. No results for input(s): AMMONIA in the last 168 hours. CBC: Recent Labs  Lab 12/06/18 1955 12/07/18 0606 12/08/18 0420 12/09/18 0005  WBC 4.6 3.2* 2.7* 4.3  NEUTROABS 2.7 2.8 1.8 3.0  HGB 13.0 12.4 12.3 11.6*  HCT 42.7 41.9 40.9 38.5  MCV 96.6 95.7 95.6 95.8  PLT 106* 101* 102* 120*   Cardiac Enzymes: No results for input(s): CKTOTAL, CKMB, CKMBINDEX, TROPONINI in the last 168 hours. BNP: Invalid input(s): POCBNP CBG: Recent Labs  Lab 12/09/18 0750 12/09/18 1217 12/09/18 1631 12/09/18 2235 12/10/18 0738  GLUCAP 196* 135* 134* 119* 186*   D-Dimer Recent Labs    12/09/18 0005 12/10/18 0024  DDIMER 1.62* 1.33*   Hgb A1c No results for input(s): HGBA1C in the last 72 hours. Lipid Profile No results for input(s): CHOL, HDL, LDLCALC, TRIG, CHOLHDL, LDLDIRECT in the last 72 hours. Thyroid function studies No results for input(s): TSH, T4TOTAL, T3FREE, THYROIDAB in the last 72 hours.  Invalid input(s): FREET3 Anemia work up Recent Labs    12/09/18 0005 12/10/18 0024  FERRITIN 93 85   Urinalysis    Component Value Date/Time   COLORURINE YELLOW 11/09/2018 1829   APPEARANCEUR TURBID (A) 11/09/2018 1829   LABSPEC 1.012 11/09/2018 1829   PHURINE 5.0 11/09/2018 1829   GLUCOSEU NEGATIVE 11/09/2018 1829   GLUCOSEU NEGATIVE 02/19/2015 1707   HGBUR SMALL (A) 11/09/2018 1829   BILIRUBINUR NEGATIVE 11/09/2018 1829   BILIRUBINUR negative 09/05/2013 0832   KETONESUR NEGATIVE 11/09/2018 1829   PROTEINUR 30 (A) 11/09/2018 1829   UROBILINOGEN 2.0 (A) 02/19/2015 1707   NITRITE NEGATIVE 11/09/2018 1829   LEUKOCYTESUR LARGE (A) 11/09/2018 1829   Sepsis Labs Invalid input(s): PROCALCITONIN,  WBC,  LACTICIDVEN Microbiology Recent Results (from the past 240 hour(s))  Blood Culture (routine x 2)     Status: Abnormal   Collection Time: 12/06/18  7:40 PM   Specimen: BLOOD  Result Value Ref Range Status   Specimen Description   Final     BLOOD RIGHT ANTECUBITAL Performed at [facility], [place] [address] [place], [place] [address]    Special Requests   Final    BOTTLES DRAWN AEROBIC AND ANAEROBIC Blood Culture results may not be optimal due to an excessive volume of blood received in culture bottles  Performed at [facility], [place] [address] [place], [place] [address]    Culture  Setup Time   Final    GRAM POSITIVE COCCI AEROBIC BOTTLE ONLY Gram Stain Report Called to,Read Back By and Verified WithCristopher Estimable PHARMD 1749 12/08/18 A BROWNING    Culture (A)  Final    STAPHYLOCOCCUS SPECIES (COAGULASE NEGATIVE) THE SIGNIFICANCE OF ISOLATING THIS ORGANISM FROM A SINGLE SET OF BLOOD CULTURES WHEN MULTIPLE SETS ARE DRAWN IS UNCERTAIN. PLEASE NOTIFY THE MICROBIOLOGY DEPARTMENT WITHIN ONE WEEK IF SPECIATION AND SENSITIVITIES ARE REQUIRED. Performed at [facility], [place] [address], [place], [place] [address]    Report Status 12/09/2018 FINAL  Final  Blood Culture (routine x 2)     Status: None (Preliminary result)   Collection Time: 12/06/18  7:47 PM   Specimen: BLOOD  Result Value Ref Range Status   Specimen Description   Final    BLOOD RIGHT WRIST Performed at [place] [address] [place], [place] [address]    Special Requests   Final    BOTTLES DRAWN AEROBIC AND ANAEROBIC Blood Culture results may not be optimal due to an excessive volume of blood received in culture bottles Performed at [place] [address], [place], [place] [address]    Culture   Final    NO GROWTH 3 DAYS Performed at [facility], [place] [address] [place], [place] [address]    Report Status PENDING  Incomplete  SARS Coronavirus 2 by RT PCR (hospital order, performed in [facility] hospital lab) Nasopharyngeal Nasopharyngeal Swab     Status: Abnormal   Collection Time: 12/06/18  7:55 PM   Specimen: Nasopharyngeal Swab  Result Value Ref Range  Status   SARS Coronavirus 2 POSITIVE (A) NEGATIVE Final    Comment: RESULT CALLED TO, READ BACK BY AND VERIFIED WITH: [facility] FARRAR @ 1012 ON 12/06/2018 C VARNER (NOTE) If result is NEGATIVE SARS-CoV-2 target nucleic acids are NOT DETECTED. The SARS-CoV-2 RNA is generally detectable in upper and lower  respiratory specimens during the acute phase of infection. The lowest  concentration of SARS-CoV-2 viral copies this assay can detect is 250  copies / mL. A negative result does not preclude SARS-CoV-2 infection  and should not be used as the sole basis for treatment or other  patient management decisions.  A negative result may occur with  improper specimen collection / handling, submission of specimen other  than nasopharyngeal swab, presence of viral mutation(s) within the  areas targeted by this assay, and inadequate number of viral copies  (<250 copies / mL). A negative result must be combined with clinical  observations, patient history, and epidemiological information. If result is POSITIVE SARS-CoV-2 target nucleic acids are DETECT ED. The SARS-CoV-2 RNA is generally detectable in upper and lower  respiratory specimens during the acute phase of infection.  Positive  results are indicative of active infection with SARS-CoV-2.  Clinical  correlation with patient history and other diagnostic information is  necessary to determine patient infection status.  Positive results do  not rule out bacterial infection or co-infection with other viruses. If result is PRESUMPTIVE POSTIVE SARS-CoV-2 nucleic acids MAY BE PRESENT.   A presumptive positive result was obtained on the submitted specimen  and confirmed on repeat testing.  While 2019 novel coronavirus  (SARS-CoV-2) nucleic acids may be present in the submitted sample  additional confirmatory testing may be necessary for epidemiological  and / or  clinical management purposes  to differentiate between  SARS-CoV-2 and other Sarbecovirus  currently known to infect humans.  If clinically indicated additional testing with an alternate test  methodology (LAB74 53) is advised. The SARS-CoV-2 RNA is generally  detectable in upper and lower respiratory specimens during the acute  phase of infection. The expected result is Negative. Fact Sheet for Patients:  StrictlyIdeas.no Fact Sheet for Healthcare Providers: BankingDealers.co.za This test is not yet approved or cleared by the [place] FDA and has been authorized for detection and/or diagnosis of SARS-CoV-2 by FDA under an Emergency Use Authorization (EUA).  This EUA will remain in effect (meaning this test can be used) for the duration of the COVID-19 declaration under Section 564(b)(1) of the Act, 21 U.S.C. section 360bbb-3(b)(1), unless the authorization is terminated or revoked sooner. Performed at [facility], [place] [address], [place], [place] [address]      Time coordinating discharge: 45 minutes  SIGNED:   Tawni Millers, MD  [facility] 12/10/2018, 8:31 AM

## 2018-12-12 ENCOUNTER — Telehealth: Payer: Self-pay

## 2018-12-12 ENCOUNTER — Other Ambulatory Visit: Payer: Medicare Other | Admitting: Orthotics

## 2018-12-12 LAB — CULTURE, BLOOD (ROUTINE X 2): Culture: NO GROWTH

## 2018-12-12 LAB — INTERLEUKIN-6, PLASMA: Interleukin-6, Plasma: <0.7 pg/mL (ref 0.0–12.2)

## 2018-12-12 NOTE — Telephone Encounter (Signed)
Patient was admitted to on 12/06/2018 and dc'ed on 12/10/2018 after acute respiratory failure with hypoxia and viral pnuemonia caused by COVID 19. Patient was admitted from the SNF she resided and was dc'ed to the SNF.   lvm at home number (with [name] Paradis/pt spouse) to call back and possibly schedule hospital follow up if able.

## 2018-12-23 ENCOUNTER — Ambulatory Visit: Payer: Medicare Other

## 2019-01-03 ENCOUNTER — Other Ambulatory Visit: Payer: Self-pay | Admitting: Obstetrics & Gynecology

## 2019-01-03 DIAGNOSIS — N6021 Fibroadenosis of right breast: Secondary | ICD-10-CM

## 2019-01-12 ENCOUNTER — Ambulatory Visit: Payer: Self-pay

## 2019-01-12 NOTE — Telephone Encounter (Signed)
At this point I do not know.  The FDA was going to review the vaccines and advise when patients who have had Covid should receive the vaccine.  At this point we will need to wait for couple of months when vaccines will be available and depending on what vaccine is available we will look into this then.

## 2019-01-12 NOTE — Telephone Encounter (Signed)
Questioning if she should have the Covid 19 vaccine after receiving a type of medication, recently while hospitalized. Medication is Morritza-Revera. Routing to provider for advice for patient.   Answer Assessment - Initial Assessment Questions 1. REASON FOR CALL or QUESTION: "What is your reason for calling today?" or "How can I best help you?" or "What question do you have that I can help answer?"     Patient has questions regarding Covi19 vaccine.  Protocols used: INFORMATION ONLY CALL-A-AH

## 2019-01-13 NOTE — Telephone Encounter (Signed)
Left message for patient to call back to inform of below.  

## 2019-01-16 ENCOUNTER — Telehealth: Payer: Self-pay

## 2019-01-16 DIAGNOSIS — R9389 Abnormal findings on diagnostic imaging of other specified body structures: Secondary | ICD-10-CM

## 2019-01-16 NOTE — Telephone Encounter (Signed)
Documented in other phone note.

## 2019-01-16 NOTE — Telephone Encounter (Signed)
Patient is calling to have referral placed to pulmonology. Please advise [phone]- [phone] Patient is requesting Lovena Le to call her back

## 2019-01-16 NOTE — Telephone Encounter (Signed)
Pt aware of response below.  

## 2019-01-16 NOTE — Telephone Encounter (Signed)
Copied from [place] [id number]. Topic: General - Other >> Jan 16, 2019  3:00 PM Oneta Rack wrote: Reason for CRM:   Patient returning "Erline Levine" call as per nurse triage TE patient states please call mobile # [phone] [facility])

## 2019-01-17 ENCOUNTER — Ambulatory Visit: Payer: Medicare Other | Admitting: Physician Assistant

## 2019-01-17 NOTE — Addendum Note (Signed)
Addended by: [name] on: 01/17/2019 02:41 PM   Modules accepted: Orders

## 2019-01-17 NOTE — Telephone Encounter (Signed)
CXR from when?  We will need copies of report so they can look at them.    Referral ordered. It may take longer to get in since they are very busy in the hospital

## 2019-01-17 NOTE — Telephone Encounter (Signed)
Advised pt to get facility to send over results of recent xrays. She is going to let the PA know.

## 2019-01-17 NOTE — Telephone Encounter (Signed)
Pt states the PA at her facility [facility]) stated that she had irregularities on her chest xray that was taken twice and they recommended that she get a referral to pulmonary. Please advise.

## 2019-01-19 ENCOUNTER — Telehealth: Payer: Self-pay

## 2019-01-19 NOTE — Telephone Encounter (Signed)
Copied from [place] [id number]. Topic: General - Other >> Jan 19, 2019  2:19 PM [name] D wrote: Reason for CRM: pt called saying she needs a new order to get her B 12 shot sent to [facility].  She is leaving [facility] today.   Pt does not have a contact number.

## 2019-01-20 ENCOUNTER — Telehealth: Payer: Self-pay | Admitting: Internal Medicine

## 2019-01-20 ENCOUNTER — Ambulatory Visit: Payer: Medicare Other | Admitting: Podiatry

## 2019-01-20 NOTE — Telephone Encounter (Signed)
Gave ok for orders per Dr. Burns.  

## 2019-01-20 NOTE — Telephone Encounter (Signed)
Copied from [place] [id number]. Topic: General - Other >> Jan 20, 2019  3:05 PM Mcneil, Ja-Kwan wrote: Reason for CRM: Mo with [facility] stated they were alerted that there is a medication interaction with spironolactone (ALDACTONE) 25 MG tablet and potassium chloride SA (KLOR-CON) 20 MEQ tablet. Mo requests either a verbal or faxed order for B-12 injection. Mo also stated that pt had a fall yesterday and is experiencing right knee pain. Mo would like to know if an order will be sent for x-ray of right knee. Mo also requests an approval for skilled nursing for wound care 2 times a week for 8 weeks. Cb# [phone]

## 2019-01-20 NOTE — Telephone Encounter (Signed)
Ok to give verbal 

## 2019-01-20 NOTE — Telephone Encounter (Signed)
Has been stable on both medications - continue both.   Ok verbal for B12.  [place] for skilled nursing.   Ok for [employer] of knee

## 2019-01-23 ENCOUNTER — Telehealth: Payer: Self-pay

## 2019-01-23 NOTE — Telephone Encounter (Signed)
Copied from [place] [phone]. Topic: General - Call Back - No Documentation >> Jan 20, 2019  4:16 PM Erick Blinks wrote: [place], [place] extension [phone]

## 2019-01-23 NOTE — Telephone Encounter (Signed)
Tried calling April [name] back. No answer and wait time was long.

## 2019-01-25 ENCOUNTER — Telehealth: Payer: Self-pay | Admitting: Internal Medicine

## 2019-01-25 NOTE — Telephone Encounter (Signed)
Will from [facility] called regarding patient evaluation be reschedule till next week due to patient request Call back [phone]

## 2019-01-25 NOTE — Telephone Encounter (Signed)
Gave ok to move visit.

## 2019-01-30 ENCOUNTER — Ambulatory Visit: Payer: Self-pay | Admitting: Internal Medicine

## 2019-01-30 NOTE — Telephone Encounter (Signed)
Pt reports husband taken to ED this am by EMS, "Covid symptoms." States while EMS at home, checked her temp, 102.0. States chills at times, dry cough. Denies any SOB, no body aches, no GI issues. States tested positive for covid 12/01/2018 then neg 12/7.  Husbands symptoms began last Wednesday. Care advise given per Covid symptoms tier. Advised to CB if temp lasts 3 days, reaches 103.0, cough worsens. Stay hydrated, rest. Go to ED if any SOB occurs.States will quarantine per guidelines, reviewed.   Pt states her son will bring her anything she may need and leave on porch.  Assured NT would route to practice for Dr. Quay Burow review.  Advised to [facility] for any additional questions.  Pt verbalizes understanding.  Reason for Disposition . [1] Fever > 101 F (38.3 C) AND [2] age > 36    Husband with covid symptoms, transported to ED  Answer Assessment - Initial Assessment Questions 1. TEMPERATURE: "What is the most recent temperature?"  "How was it measured?"      102.0  Paramedics at home, Unsure type 2. ONSET: "When did the fever start?"      This AM 3. SYMPTOMS: "Do you have any other symptoms besides the fever?"  (e.g., colds, headache, sore throat, earache, cough, rash, diarrhea, vomiting, abdominal pain)    Dry cough 4. CAUSE: If there are no symptoms, ask: "What do you think is causing the fever?"     Husband taken to ED this AM, "Covid symptoms" 5. CONTACTS: "Does anyone else in the family have an infection?"      6. TREATMENT: "What have you done so far to treat this fever?" (e.g., medications)     nothing 7. IMMUNOCOMPROMISE: "Do you have of the following: diabetes, HIV positive, splenectomy, cancer chemotherapy, chronic steroid treatment, transplant patient, etc."     no  Protocols used: FEVER-A-AH

## 2019-02-02 ENCOUNTER — Inpatient Hospital Stay (HOSPITAL_COMMUNITY)
Admission: EM | Admit: 2019-02-02 | Discharge: 2019-03-01 | DRG: 603 | Disposition: A | Discharge status: Skilled Nursing Facility | Payer: Medicare Other | Attending: Family Medicine | Admitting: Family Medicine

## 2019-02-02 ENCOUNTER — Encounter (HOSPITAL_COMMUNITY): Payer: Self-pay

## 2019-02-02 DIAGNOSIS — Z87442 Personal history of urinary calculi: Secondary | ICD-10-CM

## 2019-02-02 DIAGNOSIS — I2510 Atherosclerotic heart disease of native coronary artery without angina pectoris: Secondary | ICD-10-CM | POA: Diagnosis not present

## 2019-02-02 DIAGNOSIS — S91301A Unspecified open wound, right foot, initial encounter: Secondary | ICD-10-CM | POA: Diagnosis not present

## 2019-02-02 DIAGNOSIS — Z8711 Personal history of peptic ulcer disease: Secondary | ICD-10-CM

## 2019-02-02 DIAGNOSIS — Z882 Allergy status to sulfonamides status: Secondary | ICD-10-CM

## 2019-02-02 DIAGNOSIS — M1A09X0 Idiopathic chronic gout, multiple sites, without tophus (tophi): Secondary | ICD-10-CM

## 2019-02-02 DIAGNOSIS — E1122 Type 2 diabetes mellitus with diabetic chronic kidney disease: Secondary | ICD-10-CM | POA: Diagnosis present

## 2019-02-02 DIAGNOSIS — N390 Urinary tract infection, site not specified: Secondary | ICD-10-CM | POA: Diagnosis not present

## 2019-02-02 DIAGNOSIS — M109 Gout, unspecified: Secondary | ICD-10-CM | POA: Diagnosis not present

## 2019-02-02 DIAGNOSIS — M8580 Other specified disorders of bone density and structure, unspecified site: Secondary | ICD-10-CM | POA: Diagnosis present

## 2019-02-02 DIAGNOSIS — L03119 Cellulitis of unspecified part of limb: Secondary | ICD-10-CM | POA: Diagnosis not present

## 2019-02-02 DIAGNOSIS — Z833 Family history of diabetes mellitus: Secondary | ICD-10-CM

## 2019-02-02 DIAGNOSIS — R05 Cough: Secondary | ICD-10-CM | POA: Diagnosis not present

## 2019-02-02 DIAGNOSIS — Z79899 Other long term (current) drug therapy: Secondary | ICD-10-CM

## 2019-02-02 DIAGNOSIS — B9689 Other specified bacterial agents as the cause of diseases classified elsewhere: Secondary | ICD-10-CM | POA: Diagnosis not present

## 2019-02-02 DIAGNOSIS — M1711 Unilateral primary osteoarthritis, right knee: Secondary | ICD-10-CM | POA: Diagnosis not present

## 2019-02-02 DIAGNOSIS — I872 Venous insufficiency (chronic) (peripheral): Secondary | ICD-10-CM | POA: Diagnosis present

## 2019-02-02 DIAGNOSIS — I87311 Chronic venous hypertension (idiopathic) with ulcer of right lower extremity: Secondary | ICD-10-CM | POA: Diagnosis not present

## 2019-02-02 DIAGNOSIS — Z791 Long term (current) use of non-steroidal anti-inflammatories (NSAID): Secondary | ICD-10-CM

## 2019-02-02 DIAGNOSIS — Z8616 Personal history of COVID-19: Secondary | ICD-10-CM | POA: Diagnosis not present

## 2019-02-02 DIAGNOSIS — Z803 Family history of malignant neoplasm of breast: Secondary | ICD-10-CM

## 2019-02-02 DIAGNOSIS — L03115 Cellulitis of right lower limb: Secondary | ICD-10-CM | POA: Diagnosis not present

## 2019-02-02 DIAGNOSIS — Z87440 Personal history of urinary (tract) infections: Secondary | ICD-10-CM

## 2019-02-02 DIAGNOSIS — N179 Acute kidney failure, unspecified: Secondary | ICD-10-CM | POA: Diagnosis not present

## 2019-02-02 DIAGNOSIS — I5032 Chronic diastolic (congestive) heart failure: Secondary | ICD-10-CM | POA: Diagnosis present

## 2019-02-02 DIAGNOSIS — Z823 Family history of stroke: Secondary | ICD-10-CM

## 2019-02-02 DIAGNOSIS — Z880 Allergy status to penicillin: Secondary | ICD-10-CM

## 2019-02-02 DIAGNOSIS — K219 Gastro-esophageal reflux disease without esophagitis: Secondary | ICD-10-CM | POA: Diagnosis present

## 2019-02-02 DIAGNOSIS — Z7401 Bed confinement status: Secondary | ICD-10-CM | POA: Diagnosis not present

## 2019-02-02 DIAGNOSIS — Z9089 Acquired absence of other organs: Secondary | ICD-10-CM

## 2019-02-02 DIAGNOSIS — G629 Polyneuropathy, unspecified: Secondary | ICD-10-CM | POA: Diagnosis not present

## 2019-02-02 DIAGNOSIS — R29898 Other symptoms and signs involving the musculoskeletal system: Secondary | ICD-10-CM | POA: Diagnosis not present

## 2019-02-02 DIAGNOSIS — R262 Difficulty in walking, not elsewhere classified: Secondary | ICD-10-CM | POA: Diagnosis not present

## 2019-02-02 DIAGNOSIS — R609 Edema, unspecified: Secondary | ICD-10-CM | POA: Diagnosis not present

## 2019-02-02 DIAGNOSIS — Z90710 Acquired absence of both cervix and uterus: Secondary | ICD-10-CM

## 2019-02-02 DIAGNOSIS — L97912 Non-pressure chronic ulcer of unspecified part of right lower leg with fat layer exposed: Secondary | ICD-10-CM | POA: Diagnosis not present

## 2019-02-02 DIAGNOSIS — J449 Chronic obstructive pulmonary disease, unspecified: Secondary | ICD-10-CM | POA: Diagnosis present

## 2019-02-02 DIAGNOSIS — Z743 Need for continuous supervision: Secondary | ICD-10-CM | POA: Diagnosis not present

## 2019-02-02 DIAGNOSIS — E11621 Type 2 diabetes mellitus with foot ulcer: Secondary | ICD-10-CM | POA: Diagnosis present

## 2019-02-02 DIAGNOSIS — Z800 Family history of malignant neoplasm of digestive organs: Secondary | ICD-10-CM

## 2019-02-02 DIAGNOSIS — N1831 Chronic kidney disease, stage 3a: Secondary | ICD-10-CM | POA: Diagnosis not present

## 2019-02-02 DIAGNOSIS — R0902 Hypoxemia: Secondary | ICD-10-CM

## 2019-02-02 DIAGNOSIS — Z6841 Body Mass Index (BMI) 40.0 and over, adult: Secondary | ICD-10-CM

## 2019-02-02 DIAGNOSIS — M10071 Idiopathic gout, right ankle and foot: Secondary | ICD-10-CM | POA: Diagnosis not present

## 2019-02-02 DIAGNOSIS — B965 Pseudomonas (aeruginosa) (mallei) (pseudomallei) as the cause of diseases classified elsewhere: Secondary | ICD-10-CM | POA: Diagnosis not present

## 2019-02-02 DIAGNOSIS — L97421 Non-pressure chronic ulcer of left heel and midfoot limited to breakdown of skin: Secondary | ICD-10-CM | POA: Diagnosis not present

## 2019-02-02 DIAGNOSIS — I10 Essential (primary) hypertension: Secondary | ICD-10-CM | POA: Diagnosis not present

## 2019-02-02 DIAGNOSIS — Z888 Allergy status to other drugs, medicaments and biological substances status: Secondary | ICD-10-CM

## 2019-02-02 DIAGNOSIS — G4733 Obstructive sleep apnea (adult) (pediatric): Secondary | ICD-10-CM | POA: Diagnosis present

## 2019-02-02 DIAGNOSIS — K649 Unspecified hemorrhoids: Secondary | ICD-10-CM | POA: Diagnosis not present

## 2019-02-02 DIAGNOSIS — J45909 Unspecified asthma, uncomplicated: Secondary | ICD-10-CM | POA: Diagnosis not present

## 2019-02-02 DIAGNOSIS — N898 Other specified noninflammatory disorders of vagina: Secondary | ICD-10-CM | POA: Diagnosis not present

## 2019-02-02 DIAGNOSIS — Z86010 Personal history of colonic polyps: Secondary | ICD-10-CM

## 2019-02-02 DIAGNOSIS — L0390 Cellulitis, unspecified: Secondary | ICD-10-CM | POA: Diagnosis not present

## 2019-02-02 DIAGNOSIS — E785 Hyperlipidemia, unspecified: Secondary | ICD-10-CM | POA: Diagnosis not present

## 2019-02-02 DIAGNOSIS — K5900 Constipation, unspecified: Secondary | ICD-10-CM | POA: Diagnosis not present

## 2019-02-02 DIAGNOSIS — Z8042 Family history of malignant neoplasm of prostate: Secondary | ICD-10-CM

## 2019-02-02 DIAGNOSIS — R0602 Shortness of breath: Secondary | ICD-10-CM | POA: Diagnosis not present

## 2019-02-02 DIAGNOSIS — N183 Chronic kidney disease, stage 3 unspecified: Secondary | ICD-10-CM | POA: Diagnosis not present

## 2019-02-02 DIAGNOSIS — J9601 Acute respiratory failure with hypoxia: Secondary | ICD-10-CM | POA: Diagnosis not present

## 2019-02-02 DIAGNOSIS — Z9681 Presence of artificial skin: Secondary | ICD-10-CM | POA: Diagnosis present

## 2019-02-02 DIAGNOSIS — M170 Bilateral primary osteoarthritis of knee: Secondary | ICD-10-CM | POA: Diagnosis present

## 2019-02-02 DIAGNOSIS — R52 Pain, unspecified: Secondary | ICD-10-CM

## 2019-02-02 DIAGNOSIS — E1142 Type 2 diabetes mellitus with diabetic polyneuropathy: Secondary | ICD-10-CM | POA: Diagnosis present

## 2019-02-02 DIAGNOSIS — Z751 Person awaiting admission to adequate facility elsewhere: Secondary | ICD-10-CM

## 2019-02-02 DIAGNOSIS — L97411 Non-pressure chronic ulcer of right heel and midfoot limited to breakdown of skin: Secondary | ICD-10-CM | POA: Diagnosis present

## 2019-02-02 DIAGNOSIS — Z8249 Family history of ischemic heart disease and other diseases of the circulatory system: Secondary | ICD-10-CM

## 2019-02-02 DIAGNOSIS — E1140 Type 2 diabetes mellitus with diabetic neuropathy, unspecified: Secondary | ICD-10-CM | POA: Diagnosis not present

## 2019-02-02 DIAGNOSIS — M6281 Muscle weakness (generalized): Secondary | ICD-10-CM | POA: Diagnosis not present

## 2019-02-02 DIAGNOSIS — I130 Hypertensive heart and chronic kidney disease with heart failure and stage 1 through stage 4 chronic kidney disease, or unspecified chronic kidney disease: Secondary | ICD-10-CM | POA: Diagnosis not present

## 2019-02-02 DIAGNOSIS — L97811 Non-pressure chronic ulcer of other part of right lower leg limited to breakdown of skin: Secondary | ICD-10-CM | POA: Diagnosis present

## 2019-02-02 DIAGNOSIS — R059 Cough, unspecified: Secondary | ICD-10-CM

## 2019-02-02 DIAGNOSIS — M79672 Pain in left foot: Secondary | ICD-10-CM | POA: Diagnosis not present

## 2019-02-02 DIAGNOSIS — M7732 Calcaneal spur, left foot: Secondary | ICD-10-CM | POA: Diagnosis not present

## 2019-02-02 DIAGNOSIS — M2550 Pain in unspecified joint: Secondary | ICD-10-CM | POA: Diagnosis not present

## 2019-02-02 DIAGNOSIS — Z7951 Long term (current) use of inhaled steroids: Secondary | ICD-10-CM

## 2019-02-02 DIAGNOSIS — R531 Weakness: Secondary | ICD-10-CM | POA: Diagnosis present

## 2019-02-02 DIAGNOSIS — Z79891 Long term (current) use of opiate analgesic: Secondary | ICD-10-CM

## 2019-02-02 DIAGNOSIS — L97919 Non-pressure chronic ulcer of unspecified part of right lower leg with unspecified severity: Secondary | ICD-10-CM | POA: Diagnosis not present

## 2019-02-02 DIAGNOSIS — D519 Vitamin B12 deficiency anemia, unspecified: Secondary | ICD-10-CM | POA: Diagnosis not present

## 2019-02-02 DIAGNOSIS — M7989 Other specified soft tissue disorders: Secondary | ICD-10-CM | POA: Diagnosis not present

## 2019-02-02 LAB — BASIC METABOLIC PANEL
Anion gap: 10 (ref 5–15)
BUN: 45 mg/dL — ABNORMAL HIGH (ref 8–23)
CO2: 30 mmol/L (ref 22–32)
Calcium: 9.3 mg/dL (ref 8.9–10.3)
Chloride: 98 mmol/L (ref 98–111)
Creatinine, Ser: 1.89 mg/dL — ABNORMAL HIGH (ref 0.44–1.00)
GFR calc Af Amer: 31 mL/min — ABNORMAL LOW (ref >60)
GFR calc non Af Amer: 27 mL/min — ABNORMAL LOW (ref >60)
Glucose, Bld: 106 mg/dL — ABNORMAL HIGH (ref 70–99)
Potassium: 4.2 mmol/L (ref 3.5–5.1)
Sodium: 138 mmol/L (ref 135–145)

## 2019-02-02 LAB — LACTIC ACID, PLASMA
Lactic Acid, Venous: 1.1 mmol/L (ref 0.5–1.9)
Lactic Acid, Venous: 1 mmol/L (ref 0.5–1.9)

## 2019-02-02 LAB — GLUCOSE, CAPILLARY: Glucose-Capillary: 59 mg/dL — ABNORMAL LOW (ref 70–99)

## 2019-02-02 LAB — CBC WITH DIFFERENTIAL/PLATELET
Abs Immature Granulocytes: 0.03 10*3/uL (ref 0.00–0.07)
Basophils Absolute: 0.1 10*3/uL (ref 0.0–0.1)
Basophils Relative: 1 %
Eosinophils Absolute: 0.2 10*3/uL (ref 0.0–0.5)
Eosinophils Relative: 4 %
HCT: 36.3 % (ref 36.0–46.0)
Hemoglobin: 11.2 g/dL — ABNORMAL LOW (ref 12.0–15.0)
Immature Granulocytes: 1 %
Lymphocytes Relative: 18 %
Lymphs Abs: 0.9 10*3/uL (ref 0.7–4.0)
MCH: 29.5 pg (ref 26.0–34.0)
MCHC: 30.9 g/dL (ref 30.0–36.0)
MCV: 95.5 fL (ref 80.0–100.0)
Monocytes Absolute: 0.4 10*3/uL (ref 0.1–1.0)
Monocytes Relative: 7 %
Neutro Abs: 3.6 10*3/uL (ref 1.7–7.7)
Neutrophils Relative %: 69 %
Platelets: 235 10*3/uL (ref 150–400)
RBC: 3.8 MIL/uL — ABNORMAL LOW (ref 3.87–5.11)
RDW: 16.3 % — ABNORMAL HIGH (ref 11.5–15.5)
WBC: 5.1 10*3/uL (ref 4.0–10.5)
nRBC: 0 % (ref 0.0–0.2)

## 2019-02-02 LAB — SARS CORONAVIRUS 2 (TAT 6-24 HRS): SARS Coronavirus 2: NEGATIVE

## 2019-02-02 MED ORDER — ALLOPURINOL 100 MG PO TABS
100.0000 mg | ORAL_TABLET | Freq: Every day | ORAL | Status: DC
  Administered 2019-02-03 – 2019-03-01 (×27): 100 mg via ORAL
  Filled 2019-02-02 (×27): qty 1

## 2019-02-02 MED ORDER — TRAMADOL HCL 50 MG PO TABS
50.0000 mg | ORAL_TABLET | Freq: Four times a day (QID) | ORAL | Status: DC | PRN
  Administered 2019-02-02 – 2019-03-01 (×26): 50 mg via ORAL
  Filled 2019-02-02 (×25): qty 1

## 2019-02-02 MED ORDER — SILVER SULFADIAZINE 1 % EX CREA
1.0000 "application " | TOPICAL_CREAM | Freq: Every day | CUTANEOUS | Status: DC
  Administered 2019-02-03 – 2019-03-01 (×23): 1 via TOPICAL
  Filled 2019-02-02: qty 85

## 2019-02-02 MED ORDER — SODIUM CHLORIDE 0.9 % IV SOLN
2.0000 g | INTRAVENOUS | Status: DC
  Administered 2019-02-02 – 2019-02-11 (×10): 2 g via INTRAVENOUS
  Filled 2019-02-02 (×10): qty 20

## 2019-02-02 MED ORDER — CLINDAMYCIN PHOSPHATE 600 MG/50ML IV SOLN
600.0000 mg | Freq: Once | INTRAVENOUS | Status: AC
  Administered 2019-02-02: 600 mg via INTRAVENOUS
  Filled 2019-02-02: qty 50

## 2019-02-02 MED ORDER — BACID PO TABS
2.0000 | ORAL_TABLET | Freq: Three times a day (TID) | ORAL | Status: DC
  Administered 2019-02-02 – 2019-03-01 (×80): 2 via ORAL
  Filled 2019-02-02 (×82): qty 2

## 2019-02-02 MED ORDER — FUROSEMIDE 20 MG PO TABS: 80.0000 mg | ORAL_TABLET | Freq: Two times a day (BID) | ORAL | Status: DC

## 2019-02-02 MED ORDER — COLCHICINE 0.6 MG PO TABS
0.3000 mg | ORAL_TABLET | Freq: Every day | ORAL | Status: DC
  Administered 2019-02-03 – 2019-03-01 (×27): 0.3 mg via ORAL
  Filled 2019-02-02 (×29): qty 0.5

## 2019-02-02 MED ORDER — SPIRONOLACTONE 25 MG PO TABS: 25.0000 mg | ORAL_TABLET | Freq: Every day | ORAL | Status: DC

## 2019-02-02 MED ORDER — OXCARBAZEPINE 300 MG PO TABS
300.0000 mg | ORAL_TABLET | Freq: Two times a day (BID) | ORAL | Status: DC
  Administered 2019-02-02 – 2019-03-01 (×54): 300 mg via ORAL
  Filled 2019-02-02 (×56): qty 1

## 2019-02-02 MED ORDER — DICLOFENAC SODIUM 1 % TD GEL
2.0000 g | Freq: Four times a day (QID) | TRANSDERMAL | Status: DC
  Administered 2019-02-03 – 2019-03-01 (×99): 2 g via TOPICAL
  Filled 2019-02-02 (×5): qty 100

## 2019-02-02 MED ORDER — FAMOTIDINE 20 MG PO TABS
20.0000 mg | ORAL_TABLET | Freq: Two times a day (BID) | ORAL | Status: DC
  Administered 2019-02-02 – 2019-03-01 (×54): 20 mg via ORAL
  Filled 2019-02-02 (×54): qty 1

## 2019-02-02 MED ORDER — MONTELUKAST SODIUM 10 MG PO TABS
10.0000 mg | ORAL_TABLET | Freq: Every day | ORAL | Status: DC
  Administered 2019-02-03 – 2019-02-28 (×26): 10 mg via ORAL
  Filled 2019-02-02 (×29): qty 1

## 2019-02-02 MED ORDER — FERROUS SULFATE 325 (65 FE) MG PO TABS
325.0000 mg | ORAL_TABLET | Freq: Every day | ORAL | Status: DC
  Administered 2019-02-03 – 2019-02-22 (×20): 325 mg via ORAL
  Filled 2019-02-02 (×20): qty 1

## 2019-02-02 MED ORDER — CARVEDILOL 12.5 MG PO TABS
12.5000 mg | ORAL_TABLET | Freq: Two times a day (BID) | ORAL | Status: DC
  Administered 2019-02-03 – 2019-03-01 (×52): 12.5 mg via ORAL
  Filled 2019-02-02 (×53): qty 1

## 2019-02-02 MED ORDER — FLUTICASONE FUROATE-VILANTEROL 200-25 MCG/INH IN AEPB
1.0000 | INHALATION_SPRAY | Freq: Every day | RESPIRATORY_TRACT | Status: DC
  Administered 2019-02-03 – 2019-03-01 (×26): 1 via RESPIRATORY_TRACT
  Filled 2019-02-02 (×2): qty 28

## 2019-02-02 MED ORDER — COLCHICINE 0.6 MG PO TABS: 0.6000 mg | ORAL_TABLET | Freq: Every day | ORAL | Status: DC

## 2019-02-02 MED ORDER — HEPARIN SODIUM (PORCINE) 5000 UNIT/ML IJ SOLN
5000.0000 [IU] | Freq: Three times a day (TID) | INTRAMUSCULAR | Status: DC
  Administered 2019-02-02 – 2019-03-01 (×81): 5000 [IU] via SUBCUTANEOUS
  Filled 2019-02-02 (×80): qty 1

## 2019-02-02 MED ORDER — TRAMADOL HCL 50 MG PO TABS: 50.0000 mg | ORAL_TABLET | Freq: Four times a day (QID) | ORAL | Status: DC | PRN

## 2019-02-02 MED ORDER — GABAPENTIN 100 MG PO CAPS
100.0000 mg | ORAL_CAPSULE | Freq: Three times a day (TID) | ORAL | Status: DC
  Administered 2019-02-03 – 2019-02-14 (×35): 100 mg via ORAL
  Filled 2019-02-02 (×36): qty 1

## 2019-02-02 MED ORDER — CARVEDILOL 12.5 MG PO TABS: 25.0000 mg | ORAL_TABLET | Freq: Every day | ORAL | Status: DC

## 2019-02-02 MED ORDER — ACETAMINOPHEN 325 MG PO TABS
650.0000 mg | ORAL_TABLET | Freq: Four times a day (QID) | ORAL | Status: DC | PRN
  Administered 2019-02-04 – 2019-02-06 (×6): 650 mg via ORAL
  Filled 2019-02-02 (×7): qty 2

## 2019-02-02 MED ORDER — ASPIRIN EC 81 MG PO TBEC
81.0000 mg | DELAYED_RELEASE_TABLET | Freq: Every day | ORAL | Status: DC
  Administered 2019-02-03 – 2019-02-28 (×26): 81 mg via ORAL
  Filled 2019-02-02 (×26): qty 1

## 2019-02-02 MED ORDER — SIMVASTATIN 20 MG PO TABS
40.0000 mg | ORAL_TABLET | Freq: Every day | ORAL | Status: DC
  Administered 2019-02-03 – 2019-02-28 (×26): 40 mg via ORAL
  Filled 2019-02-02 (×26): qty 2

## 2019-02-02 NOTE — H&P (Signed)
History and Physical    April [name] UVO:[id number] DOB: 1950/08/24 DOA: 02/02/2019  PCP: [name], MD   Patient coming from: Home  I have personally briefly reviewed patient's old medical records in [place]  Chief Complaint: Weakness  HPI: April [name] is a 68 y.o. female with medical history significant of hypertension, hyperlipidemia, Dm2, neuropathy, CKD stage 3, CAD , Asthma, morbid obesity, recent Covid infection in November, presented with bilateral lower extremity cellulitis and generalized weakness.  Patient has a chronic wound on the right shin area she has a visiting nurse to clean the wound 3 times a week, and she has chronic ambulation issue, constantly needs a lot of help from her husband for getting mobile.  This week, her husband was diagnosed with COVID-19 and was hospitalized, and patient has been feeling very weak without help to ambulate after few days.  And today it was found that she has new pain and rash around the right shin ulcer area, she also developed a blister on the bottom of the left foot, for which she does not remember she has sustained any trauma, burning.  She has episode of chills at home but no fever, denied any cough, chest pain, no diarrhea no dysuria. ED Course: Clindamycin was started for UTI for cellulitis of the right shin and left foot.  Review of Systems: As per HPI otherwise 10 point review of systems negative.   Past Medical History:  Diagnosis Date  . Anemia   . Asthma   . CAD (coronary artery disease)   . Carpal tunnel syndrome   . Cellulitis of both lower extremities 04/11/2015  . CHF (congestive heart failure) ([place])   . Chronic kidney disease    [facility]- followed by [facility]  . Colon polyp, hyperplastic 2007 & 2012  . Complication of anesthesia 1999   svt with renal calculi surgery, no problems since  . CTS (carpal tunnel syndrome)    bilateral  . Diabetes mellitus   . Eczema   . GERD (gastroesophageal  reflux disease)   . History of kidney stones 1999  . Hyperlipidemia   . Hypertension   . Leg ulcer ([place]) 04/24/2015   Right lateral leg No evidence of an infection Monitor closely Keep edema controlled   . Leg ulcer ([place])    right lower  . Meralgia paresthetica    Dr. [name]  . Morbid obesity ([place])   . Morbid obesity ([place])   . Neuropathy    toes and legs  . Osteoarthrosis, unspecified whether generalized or localized, lower leg    knee  . PUD (peptic ulcer disease)   . Shortness of breath dyspnea    with exertion  . Sleep apnea    per progress note 02/25/2018  . Type II or unspecified type diabetes mellitus without mention of complication, not stated as uncontrolled   . Unspecified hereditary and idiopathic peripheral neuropathy   . Urticaria   . Vitamin B12 deficiency   . Wears glasses   . Wound cellulitis    right upper leg, healing well    Past Surgical History:  Procedure Laterality Date  . ABDOMINAL HYSTERECTOMY    . CARDIAC CATHETERIZATION  2002   non obstructive disease  . colonoscopy with polypectomy  2007 & 2012    hyperplastic ;Dr Watt Climes  . COLONOSCOPY WITH PROPOFOL N/A 06/04/2015   Procedure: COLONOSCOPY WITH PROPOFOL;  Surgeon: Jerene Bears, MD;  Location: [facility];  Service: Gastroenterology;  Laterality: N/A;  .  DEBRIDEMENT LEG Right 03/02/2018   WOUND VAC APPLIED  . DEBRIDEMENT LEG Right 05/27/2018   RIGHT LOWER LEG DEBRIDEMENT,  SKIN GRAFT, VAC PLACEMENT   . DILATION AND CURETTAGE OF UTERUS     multiple  . HEMORRHOID SURGERY    . I & D EXTREMITY Right 03/02/2018   Procedure: RIGHT LEG DEBRIDEMENT AND PLACE VAC;  Surgeon: [name], MD;  Location: [place];  Service: Orthopedics;  Laterality: Right;  . I & D EXTREMITY Right 03/04/2018   Procedure: REPEAT IRRIGATION AND DEBRIDEMENT RIGHT LEG, PLACE WOUND VAC;  Surgeon: [name], MD;  Location: [place];  Service: Orthopedics;  Laterality: Right;  . I & D EXTREMITY Right 03/30/2018   Procedure: IRRIGATION  AND DEBRIDEMENT RIGHT LEG, APPLY WOUND VAC;  Surgeon: [name], MD;  Location: [place];  Service: Orthopedics;  Laterality: Right;  . I & D EXTREMITY Right 05/27/2018   Procedure: RIGHT LOWER LEG DEBRIDEMENT,  SKIN GRAFT, VAC PLACEMENT;  Surgeon: [name], MD;  Location: [place];  Service: Orthopedics;  Laterality: Right;  RIGHT LOWER LEG DEBRIDEMENT,  SKIN GRAFT, VAC PLACEMENT  . renal calculi  12/1997   SVT with induction of anesthesia  . right knee arthroscopy    . SKIN SPLIT GRAFT Right 03/04/2018   Procedure: POSSIBLE SPLIT THICKNESS SKIN GRAFT;  Surgeon: [name], MD;  Location: [place];  Service: Orthopedics;  Laterality: Right;  . SKIN SPLIT GRAFT Right 04/01/2018   Procedure: REPEAT IRRIGATION AND DEBRIDEMENT RIGHT LEG, APPLY SPLIT THICKNESS SKIN GRAFT;  Surgeon: [name], MD;  Location: [place];  Service: Orthopedics;  Laterality: Right;  . TONSILLECTOMY AND ADENOIDECTOMY       reports that she has never smoked. She has never used smokeless tobacco. She reports that she does not drink alcohol or use drugs.  Allergies  Allergen Reactions  . Penicillins Rash and Other (See Comments)    She was told not to take it anymore. Has patient had a PCN reaction causing immediate rash, facial/tongue/throat swelling, SOB or lightheadedness with hypotension: Yes Has patient had a PCN reaction causing severe rash involving mucus membranes or skin necrosis: No Has patient had a PCN reaction that required hospitalization: No Has patient had a PCN reaction occurring within the last 10 years: No If all of the above answers are "NO", then may proceed with Cephalosporin use.'  . Shellfish Allergy Anaphylaxis  . Sulfonamide Derivatives Anaphylaxis and Other (See Comments)    REACTION: internal "burning"  . Hydrochlorothiazide W-Triamterene Other (See Comments)    Hypokalemia  . Lotensin [Benazepril Hcl] Hives  . Dipyridamole Other (See Comments)    Unknown reaction  . Estrogens Other  (See Comments)    Unknown reaction  . Hydrochlorothiazide Other (See Comments)    Unknown reaction  . Metronidazole Other (See Comments)    Unknown reaction  . Other     Pickles-itching  . Spironolactone Other (See Comments)    UNSPECIFIED > "kidney problems"  . Sulfa Antibiotics Other (See Comments)    Unknown reaction  . Torsemide Other (See Comments)    Unknown reaction  . Valsartan Other (See Comments)    Unknown reaction  . Madelaine Bhat Isothiocyanate] Itching    Family History  Problem Relation Age of Onset  . Colon cancer Mother   . Prostate cancer Father   . Colon cancer Father   . Diabetes Maternal Aunt   . Breast cancer Maternal Aunt   . Diabetes  Maternal Uncle   . Diabetes Paternal Aunt   . Stroke Paternal Aunt        > 65  . Heart disease Paternal Aunt   . Diabetes Paternal Uncle   . Breast cancer Maternal Aunt         X 2  . Breast cancer Cousin     Prior to Admission medications   Medication Sig Start Date End Date Taking? Authorizing Provider  acetaminophen (TYLENOL) 325 MG tablet Take 2 tablets (650 mg total) by mouth every 6 (six) hours as needed for moderate pain (or Fever >/= 101). 11/16/18  Yes Arrien, Jimmy Picket, MD  allopurinol (ZYLOPRIM) 100 MG tablet Take 1 tablet (100 mg total) by mouth daily. 08/29/18  Yes Burns, Claudina Lick, MD  aspirin (ECOTRIN LOW STRENGTH) 81 MG EC tablet Take 81 mg by mouth at bedtime.    Yes [provider]  Budesonide (PULMICORT FLEXHALER) 90 MCG/ACT inhaler Inhale 2 puffs into the lungs 2 (two) times daily.   Yes [provider]  budesonide-formoterol (SYMBICORT) 160-4.5 MCG/ACT inhaler one - two inhalations every 12 hours; gargle and spit after use Patient taking differently: Inhale 2 puffs into the lungs 2 (two) times daily. gargle and spit after use 02/19/15  Yes Burns, Claudina Lick, MD  calcium carbonate (TUMS - DOSED IN MG ELEMENTAL CALCIUM) 500 MG chewable tablet Chew 2 tablets by mouth daily as needed  for indigestion or heartburn.   Yes [provider]  carvedilol (COREG) 25 MG tablet TAKE 1 TABLET BY MOUTH TWICE DAILY WITH A MEAL Patient taking differently: Take 25 mg by mouth daily.  07/18/18  Yes Burns, Claudina Lick, MD  cholecalciferol (VITAMIN D3) 25 MCG (1000 UT) tablet Take 1,000 Units by mouth daily.   Yes [provider]  COD LIVER OIL PO Take 1 capsule by mouth daily.   Yes [provider]  colchicine 0.6 MG tablet Take 1 tablet (0.6 mg total) by mouth daily. 10/28/18  Yes Burns, Claudina Lick, MD  cyanocobalamin (,VITAMIN B-12,) 1000 MCG/ML injection INJECT 1ML INTO MUSCLE EVERY 30 DAYS Patient taking differently: Inject 1,000 mcg into the muscle every 30 (thirty) days.  05/06/18  Yes Burns, Claudina Lick, MD  diclofenac sodium (VOLTAREN) 1 % GEL Apply 2 g topically 4 (four) times daily. 03/20/16  Yes Marcial Pacas, MD  famotidine (PEPCID) 20 MG tablet Take 1 tablet (20 mg total) by mouth 2 (two) times daily. 12/14/17  Yes Burns, Claudina Lick, MD  folic acid (FOLVITE) 628 MCG tablet Take 400 mcg by mouth at bedtime.    Yes [provider]  furosemide (LASIX) 80 MG tablet Take 1-1.5 tablets (80-120 mg total) by mouth 2 (two) times daily. 1.5 tablets in morning, 1.5 tablet in afternoon Patient taking differently: Take 120 mg by mouth 2 (two) times daily.  11/07/18  Yes Burns, Claudina Lick, MD  guaiFENesin-dextromethorphan (ROBITUSSIN DM) 100-10 MG/5ML syrup Take 5 mLs by mouth every 6 (six) hours as needed for cough. 12/10/18  Yes Arrien, Jimmy Picket, MD  montelukast (SINGULAIR) 10 MG tablet Take 1 tablet (10 mg total) by mouth at bedtime. 08/12/18  Yes Padgett, Rae Halsted, MD  Omega-3 Fatty Acids (FISH OIL) 1000 MG CAPS Take 1,000 mg by mouth daily.    Yes [provider]  Oxcarbazepine (TRILEPTAL) 300 MG tablet Take 1 tablet (300 mg total) by mouth 2 (two) times daily. 11/07/18  Yes Burns, Claudina Lick, MD  potassium chloride SA (KLOR-CON) 20 MEQ tablet Take  20 mEq by mouth  3 (three) times daily.   Yes [provider]  silver sulfADIAZINE (SILVADENE) 1 % cream Apply 1 application topically daily. 06/08/18  Yes [name], MD  simvastatin (ZOCOR) 40 MG tablet Take 1 tablet (40 mg total) by mouth at bedtime. 04/25/18  Yes Burns, Claudina Lick, MD  spironolactone (ALDACTONE) 25 MG tablet TAKE 1 TABLET BY MOUTH ONCE DAILY Patient taking differently: Take 25 mg by mouth daily.  10/24/18  Yes Burns, Claudina Lick, MD  traMADol (ULTRAM) 50 MG tablet Take 1 tablet (50 mg total) by mouth every 6 (six) hours as needed for moderate pain. 12/10/18  Yes Arrien, Jimmy Picket, MD  ferrous sulfate 325 (65 FE) MG tablet Take 1 tablet (325 mg total) by mouth daily with breakfast. 11/17/18 12/17/18  Arrien, Jimmy Picket, MD  gabapentin (NEURONTIN) 100 MG capsule Take 1 capsule (100 mg total) by mouth 3 (three) times daily. 11/07/18 12/07/18  [name], MD    Physical Exam: Vitals:   02/02/19 1329 02/02/19 1611 02/02/19 1745 02/02/19 1815  BP: 113/61 115/88 (!) 112/56 118/67  Pulse: 86 81 73 77  Resp: 20 12  13   Temp: 97.8 F (36.6 C) 97.7 F (36.5 C)    TempSrc: Oral Oral    SpO2: 95% 97% 93% 96%    Constitutional: NAD, calm, comfortable Vitals:   02/02/19 1329 02/02/19 1611 02/02/19 1745 02/02/19 1815  BP: 113/61 115/88 (!) 112/56 118/67  Pulse: 86 81 73 77  Resp: 20 12  13   Temp: 97.8 F (36.6 C) 97.7 F (36.5 C)    TempSrc: Oral Oral    SpO2: 95% 97% 93% 96%   Eyes: PERRL, lids and conjunctivae normal ENMT: Mucous membranes are moist. Posterior pharynx clear of any exudate or lesions.Normal dentition.  Neck: normal, supple, no masses, no thyromegaly Respiratory: clear to auscultation bilaterally, no wheezing, no crackles. Normal respiratory effort. No accessory muscle use.  Cardiovascular: Regular rate and rhythm, no murmurs / rubs / gallops. No extremity edema. 2+ pedal pulses. No carotid bruits.  Abdomen: no tenderness, no masses palpated. No  hepatosplenomegaly. Bowel sounds positive.  Musculoskeletal: no clubbing / cyanosis. No joint deformity upper and lower extremities. Good ROM, no contractures. Normal muscle tone.  Skin: Assuming photo chronic ulcer on the right shin area with peri-ulcer area feeling warm to touch and tenderness, left bottom of foot showing blister like changes. Neurologic: CN 2-12 grossly intact. Sensation intact, DTR normal. Strength 5/5 in all 4.  Psychiatric: Normal judgment and insight. Alert and oriented x 3. Normal mood.        Labs on Admission: I have personally reviewed following labs and imaging studies  CBC: Recent Labs  Lab 02/02/19 1654  WBC 5.1  NEUTROABS 3.6  HGB 11.2*  HCT 36.3  MCV 95.5  PLT 470   Basic Metabolic Panel: Recent Labs  Lab 02/02/19 1654  NA 138  K 4.2  CL 98  CO2 30  GLUCOSE 106*  BUN 45*  CREATININE 1.89*  CALCIUM 9.3   GFR: CrCl cannot be calculated (Unknown ideal weight.). Liver Function Tests: No results for input(s): AST, ALT, ALKPHOS, BILITOT, PROT, ALBUMIN in the last 168 hours. No results for input(s): LIPASE, AMYLASE in the last 168 hours. No results for input(s): AMMONIA in the last 168 hours. Coagulation Profile: No results for input(s): INR, PROTIME in the last 168 hours. Cardiac Enzymes: No results for input(s): CKTOTAL, CKMB, CKMBINDEX, TROPONINI in the last 168 hours.  BNP (last 3 results) No results for input(s): PROBNP in the last 8760 hours. HbA1C: No results for input(s): HGBA1C in the last 72 hours. CBG: No results for input(s): GLUCAP in the last 168 hours. Lipid Profile: No results for input(s): CHOL, HDL, LDLCALC, TRIG, CHOLHDL, LDLDIRECT in the last 72 hours. Thyroid Function Tests: No results for input(s): TSH, T4TOTAL, FREET4, T3FREE, THYROIDAB in the last 72 hours. Anemia Panel: No results for input(s): VITAMINB12, FOLATE, FERRITIN, TIBC, IRON, RETICCTPCT in the last 72 hours. Urine analysis:    Component Value  Date/Time   COLORURINE YELLOW 11/09/2018 1829   APPEARANCEUR TURBID (A) 11/09/2018 1829   LABSPEC 1.012 11/09/2018 1829   PHURINE 5.0 11/09/2018 1829   GLUCOSEU NEGATIVE 11/09/2018 1829   GLUCOSEU NEGATIVE 02/19/2015 1707   HGBUR SMALL (A) 11/09/2018 1829   BILIRUBINUR NEGATIVE 11/09/2018 1829   BILIRUBINUR negative 09/05/2013 0832   KETONESUR NEGATIVE 11/09/2018 1829   PROTEINUR 30 (A) 11/09/2018 1829   UROBILINOGEN 2.0 (A) 02/19/2015 1707   NITRITE NEGATIVE 11/09/2018 1829   LEUKOCYTESUR LARGE (A) 11/09/2018 1829    Radiological Exams on Admission: No results found.  EKG: Independently reviewed.   Assessment/Plan Active Problems:   Cellulitis  Right leg and left foot cellulitis, given the area involved, will start patient on Rocephin, there is no purulent discharge, will send MRSA screen as well as ASO.  Consult wound care.  Acute on chronic ambulation dysfunction, worsened by bilateral cellulitis, PT consultation.  AKI on CKD stage III, cut down colchicine dosage, hold spironolactone and Lasix for today.  Clinically patient is euvolemic, no hypotension.   Chronic neuropathy, continue renal dosed gabapentin.  Hypertension, change Coreg from daily to twice daily.  Hold Lasix and spironolactone as mentioned above.  Asthma/COPD, no symptoms or signs of exacerbation.  Morbid obesity, outpatient bariatric evaluation.         DVT prophylaxis: Heparin subcu Code Status: Full code Family Communication: None at bedside  disposition Plan: Pending on PT progress Consults called: None Admission status: MedSurg   Lequita Halt MD [facility] Pager [phone]  If 7PM-7AM, please contact night-coverage [email or web address] Password [email or web address]  02/02/2019, 7:12 PM

## 2019-02-02 NOTE — ED Triage Notes (Signed)
Pt brought in by [facility] from home for worsening leg sores on the back of both legs. Pt states she is unable to get out of her wheelchair without help. Pt states her home health nurse wanted her to be seen for the sore on the her right leg. Pt recently discharged from rehab where she was sent to recover from [place]. States her husband is currently admitted in the hospital for [place] as well.

## 2019-02-02 NOTE — ED Provider Notes (Signed)
[facility] EMERGENCY DEPARTMENT Provider Note   CSN: [id number] Arrival date & time: 02/02/19  1322     History Chief Complaint  Patient presents with  . Weakness  . Wound Care    April [name] is a 68 y.o. female.  Patient is a 68 year old female who presents with some leg wounds.  She was recently admitted for Covid in November.  She was in a rehab facility but currently is living at home.  She has difficulty with mobility given her weakness from the Covid.  Her husband was recently admitted to the hospital for a Covid infection about 4 days ago.  She does state that she has had hard time getting around that she is able to get around slowly with a walker and a wheelchair at times.  She has had a wound to her lower leg that is slow in healing.  She has home health that is coming.  The home health nurse thought her wound looked more concerning this morning and she came here for evaluation.  She has not had any known fevers.  No significant drainage from the wounds.  No new cough or cold symptoms.        Past Medical History:  Diagnosis Date  . Anemia   . Asthma   . CAD (coronary artery disease)   . Carpal tunnel syndrome   . Cellulitis of both lower extremities 04/11/2015  . CHF (congestive heart failure) ([place])   . Chronic kidney disease    [facility]- followed by [facility]  . Colon polyp, hyperplastic 2007 & 2012  . Complication of anesthesia 1999   svt with renal calculi surgery, no problems since  . CTS (carpal tunnel syndrome)    bilateral  . Diabetes mellitus   . Eczema   . GERD (gastroesophageal reflux disease)   . History of kidney stones 1999  . Hyperlipidemia   . Hypertension   . Leg ulcer ([place]) 04/24/2015   Right lateral leg No evidence of an infection Monitor closely Keep edema controlled   . Leg ulcer ([place])    right lower  . Meralgia paresthetica    Dr. [name]  . Morbid obesity ([place])   . Morbid obesity ([place])   . Neuropathy    toes and  legs  . Osteoarthrosis, unspecified whether generalized or localized, lower leg    knee  . PUD (peptic ulcer disease)   . Shortness of breath dyspnea    with exertion  . Sleep apnea    per progress note 02/25/2018  . Type II or unspecified type diabetes mellitus without mention of complication, not stated as uncontrolled   . Unspecified hereditary and idiopathic peripheral neuropathy   . Urticaria   . Vitamin B12 deficiency   . Wears glasses   . Wound cellulitis    right upper leg, healing well    Patient Active Problem List   Diagnosis Date Noted  . Pneumonia due to COVID-19 virus 12/10/2018  . COVID-19 virus infection 12/06/2018  . Chronic venous hypertension (idiopathic) with ulcer of right lower extremity (CODE) ([place]) 11/09/2018  . UTI (urinary tract infection) 11/09/2018  . Right hip pain 11/09/2018  . Achilles tendon sprain, left, initial encounter   . Osteomyelitis of ankle or foot, acute, left ([place]) 09/07/2018  . Osteomyelitis of ankle or foot, left, acute ([place]) 09/07/2018  . Acute on chronic kidney failure ([place]) 08/15/2018  . Onychomycosis 05/19/2018  . Idiopathic chronic venous hypertension of right lower extremity with  ulcer and inflammation ([place]) 05/19/2018  . Traumatic wound dehiscence   . Skin lumps 01/14/2018  . Bilateral leg cramps 12/14/2017  . Left shoulder pain 08/14/2016  . Meralgia paresthetica 07/16/2016  . Chronic left-sided low back pain with left-sided sciatica 06/02/2016  . AKI (acute kidney injury) ([place]) 03/28/2016  . Whole body pain 03/20/2016  . Diabetic peripheral neuropathy ([place]) 03/20/2016  . Osteopenia 01/11/2016  . CKD (chronic kidney disease) stage 3, GFR 30-59 ml/min ([facility]) 01/02/2016  . Hand paresthesia 07/18/2015  . Carpal tunnel syndrome 06/07/2015  . Family history of colon cancer   . Diabetes mellitus with neurological manifestations ([place]) 04/18/2015  . Bilateral leg edema 04/11/2015  . Abnormality of gait 01/03/2015  . Hereditary  and idiopathic peripheral neuropathy 01/03/2015  . GERD (gastroesophageal reflux disease) 06/17/2014  . Hx of colonic polyps 12/15/2012  . Vitamin B12 deficiency 11/03/2012  . Intrinsic asthma 03/23/2012  . Chronic diastolic heart failure ([place]) 02/20/2011  . OSA (obstructive sleep apnea) 09/17/2010  . URINARY URGENCY 01/08/2010  . CAD, NATIVE VESSEL 11/20/2008  . Osteoarthritis of knees, bilateral 06/14/2008  . Hyperlipidemia 05/10/2007  . Essential hypertension 01/18/2007  . Hypokalemia 04/30/2006  . Morbid (severe) obesity due to excess calories ([place]) 04/30/2006  . HX, PERSONAL, PEPTIC ULCER DISEASE 04/30/2006    Past Surgical History:  Procedure Laterality Date  . ABDOMINAL HYSTERECTOMY    . CARDIAC CATHETERIZATION  2002   non obstructive disease  . colonoscopy with polypectomy  2007 & 2012    hyperplastic ;Dr Watt Climes  . COLONOSCOPY WITH PROPOFOL N/A 06/04/2015   Procedure: COLONOSCOPY WITH PROPOFOL;  Surgeon: Jerene Bears, MD;  Location: [facility];  Service: Gastroenterology;  Laterality: N/A;  . DEBRIDEMENT LEG Right 03/02/2018   WOUND VAC APPLIED  . DEBRIDEMENT LEG Right 05/27/2018   RIGHT LOWER LEG DEBRIDEMENT,  SKIN GRAFT, VAC PLACEMENT   . DILATION AND CURETTAGE OF UTERUS     multiple  . HEMORRHOID SURGERY    . I & D EXTREMITY Right 03/02/2018   Procedure: RIGHT LEG DEBRIDEMENT AND PLACE VAC;  Surgeon: [name], MD;  Location: [facility];  Service: Orthopedics;  Laterality: Right;  . I & D EXTREMITY Right 03/04/2018   Procedure: REPEAT IRRIGATION AND DEBRIDEMENT RIGHT LEG, PLACE WOUND VAC;  Surgeon: [name], MD;  Location: [facility];  Service: Orthopedics;  Laterality: Right;  . I & D EXTREMITY Right 03/30/2018   Procedure: IRRIGATION AND DEBRIDEMENT RIGHT LEG, APPLY WOUND VAC;  Surgeon: [name], MD;  Location: [facility];  Service: Orthopedics;  Laterality: Right;  . I & D EXTREMITY Right 05/27/2018   Procedure: RIGHT LOWER LEG DEBRIDEMENT,  SKIN GRAFT, VAC  PLACEMENT;  Surgeon: [name], MD;  Location: [facility];  Service: Orthopedics;  Laterality: Right;  RIGHT LOWER LEG DEBRIDEMENT,  SKIN GRAFT, VAC PLACEMENT  . renal calculi  12/1997   SVT with induction of anesthesia  . right knee arthroscopy    . SKIN SPLIT GRAFT Right 03/04/2018   Procedure: POSSIBLE SPLIT THICKNESS SKIN GRAFT;  Surgeon: [name], MD;  Location: [facility];  Service: Orthopedics;  Laterality: Right;  . SKIN SPLIT GRAFT Right 04/01/2018   Procedure: REPEAT IRRIGATION AND DEBRIDEMENT RIGHT LEG, APPLY SPLIT THICKNESS SKIN GRAFT;  Surgeon: [name], MD;  Location: [facility];  Service: Orthopedics;  Laterality: Right;  . TONSILLECTOMY AND ADENOIDECTOMY       OB History   No obstetric history on file.  Family History  Problem Relation Age of Onset  . Colon cancer Mother   . Prostate cancer Father   . Colon cancer Father   . Diabetes Maternal Aunt   . Breast cancer Maternal Aunt   . Diabetes Maternal Uncle   . Diabetes Paternal Aunt   . Stroke Paternal Aunt        > 65  . Heart disease Paternal Aunt   . Diabetes Paternal Uncle   . Breast cancer Maternal Aunt         X 2  . Breast cancer Cousin     Social History   Tobacco Use  . Smoking status: Never Smoker  . Smokeless tobacco: Never Used  Substance Use Topics  . Alcohol use: No    Alcohol/week: 0.0 standard drinks  . Drug use: No    Home Medications Prior to Admission medications   Medication Sig Start Date End Date Taking? Authorizing Provider  acetaminophen (TYLENOL) 325 MG tablet Take 2 tablets (650 mg total) by mouth every 6 (six) hours as needed for moderate pain (or Fever >/= 101). 11/16/18   Arrien, Jimmy Picket, MD  allopurinol (ZYLOPRIM) 100 MG tablet Take 1 tablet (100 mg total) by mouth daily. 08/29/18   [name], MD  aspirin (ECOTRIN LOW STRENGTH) 81 MG EC tablet Take 81 mg by mouth at bedtime.     [provider]  Budesonide (PULMICORT FLEXHALER) 90 MCG/ACT inhaler  Inhale 2 puffs into the lungs 2 (two) times daily.    [provider]  budesonide-formoterol (SYMBICORT) 160-4.5 MCG/ACT inhaler one - two inhalations every 12 hours; gargle and spit after use Patient taking differently: Inhale 2 puffs into the lungs 2 (two) times daily. gargle and spit after use 02/19/15   Burns, Claudina Lick, MD  calcium carbonate (TUMS - DOSED IN MG ELEMENTAL CALCIUM) 500 MG chewable tablet Chew 2 tablets by mouth daily as needed for indigestion or heartburn.    [provider]  carvedilol (COREG) 25 MG tablet TAKE 1 TABLET BY MOUTH TWICE DAILY WITH A MEAL Patient taking differently: Take 25 mg by mouth daily.  07/18/18   [name], MD  cholecalciferol (VITAMIN D3) 25 MCG (1000 UT) tablet Take 1,000 Units by mouth daily.    [provider]  COD LIVER OIL PO Take 1 capsule by mouth daily.    [provider]  colchicine 0.6 MG tablet Take 1 tablet (0.6 mg total) by mouth daily. 10/28/18   [name], MD  cyanocobalamin (,VITAMIN B-12,) 1000 MCG/ML injection INJECT 1ML INTO MUSCLE EVERY 30 DAYS Patient taking differently: Inject 1,000 mcg into the muscle every 30 (thirty) days.  05/06/18   [name], MD  diclofenac sodium (VOLTAREN) 1 % GEL Apply 2 g topically 4 (four) times daily. 03/20/16   Marcial Pacas, MD  famotidine (PEPCID) 20 MG tablet Take 1 tablet (20 mg total) by mouth 2 (two) times daily. 12/14/17   [name], MD  ferrous sulfate 325 (65 FE) MG tablet Take 1 tablet (325 mg total) by mouth daily with breakfast. 11/17/18 12/17/18  Arrien, Jimmy Picket, MD  folic acid (FOLVITE) 623 MCG tablet Take 400 mcg by mouth at bedtime.     [provider]  furosemide (LASIX) 80 MG tablet Take 1-1.5 tablets (80-120 mg total) by mouth 2 (two) times daily. 1.5 tablets in morning, 1.5 tablet in afternoon Patient taking differently: Take 120 mg by mouth 2 (two) times daily.  11/07/18  [name], MD  gabapentin (NEURONTIN) 100 MG  capsule Take 1 capsule (100 mg total) by mouth 3 (three) times daily. 11/07/18 12/07/18  [name], MD  guaiFENesin-dextromethorphan (ROBITUSSIN DM) 100-10 MG/5ML syrup Take 5 mLs by mouth every 6 (six) hours as needed for cough. 12/10/18   Arrien, Jimmy Picket, MD  montelukast (SINGULAIR) 10 MG tablet Take 1 tablet (10 mg total) by mouth at bedtime. 08/12/18   Kennith Gain, MD  Omega-3 Fatty Acids (FISH OIL) 1000 MG CAPS Take 1,000 mg by mouth daily.     [provider]  Oxcarbazepine (TRILEPTAL) 300 MG tablet Take 1 tablet (300 mg total) by mouth 2 (two) times daily. 11/07/18   [name], MD  potassium chloride SA (KLOR-CON) 20 MEQ tablet Take 20 mEq by mouth 3 (three) times daily.    [provider]  silver sulfADIAZINE (SILVADENE) 1 % cream Apply 1 application topically daily. Patient not taking: Reported on 12/06/2018 06/08/18   [name], MD  simvastatin (ZOCOR) 40 MG tablet Take 1 tablet (40 mg total) by mouth at bedtime. 04/25/18   [name], MD  spironolactone (ALDACTONE) 25 MG tablet TAKE 1 TABLET BY MOUTH ONCE DAILY Patient taking differently: Take 25 mg by mouth daily.  10/24/18   [name], MD  traMADol (ULTRAM) 50 MG tablet Take 1 tablet (50 mg total) by mouth every 6 (six) hours as needed for moderate pain. 12/10/18   Arrien, Jimmy Picket, MD    Allergies    Penicillins, Shellfish allergy, Sulfonamide derivatives, Hydrochlorothiazide w-triamterene, Lotensin [benazepril hcl], Dipyridamole, Estrogens, Hydrochlorothiazide, Metronidazole, Other, Spironolactone, Sulfa antibiotics, Torsemide, Valsartan, and Mustard [allyl isothiocyanate]  Review of Systems   Review of Systems  Constitutional: Positive for fatigue. Negative for chills, diaphoresis and fever.  HENT: Negative for congestion, rhinorrhea and sneezing.   Eyes: Negative.   Respiratory: Positive for shortness of breath (sometimes with exertion from prior Covid infection).  Negative for cough and chest tightness.   Cardiovascular: Negative for chest pain and leg swelling.  Gastrointestinal: Negative for abdominal pain, blood in stool, diarrhea, nausea and vomiting.  Genitourinary: Negative for difficulty urinating, flank pain, frequency and hematuria.  Musculoskeletal: Negative for arthralgias and back pain.  Skin: Positive for wound. Negative for rash.  Neurological: Negative for dizziness, speech difficulty, weakness, numbness and headaches.    Physical Exam Updated Vital Signs BP 118/67   Pulse 77   Temp 97.7 F (36.5 C) (Oral)   Resp 13   SpO2 96%   Physical Exam Constitutional:      Appearance: She is well-developed.     Comments: obese  HENT:     Head: Normocephalic and atraumatic.  Eyes:     Pupils: Pupils are equal, round, and reactive to light.  Cardiovascular:     Rate and Rhythm: Normal rate and regular rhythm.     Heart sounds: Normal heart sounds.  Pulmonary:     Effort: Pulmonary effort is normal. No respiratory distress.     Breath sounds: Normal breath sounds. No wheezing or rales.  Chest:     Chest wall: No tenderness.  Abdominal:     General: Bowel sounds are normal.     Palpations: Abdomen is soft.     Tenderness: There is no abdominal tenderness. There is no guarding or rebound.  Musculoskeletal:        General: Normal range of motion.     Cervical back: Normal range of motion and neck  supple.     Comments: Patient has a healing wound to her anterior right lower leg.  There is no drainage.  No necrotic areas.  There are some mild warmth and erythema distal to the wound.  Pedal pulses are intact.  There is some slight skin breakdown to both of her posterior thighs but no overlying signs of infection or drainage.  Lymphadenopathy:     Cervical: No cervical adenopathy.  Skin:    General: Skin is warm and dry.     Findings: No rash.  Neurological:     Mental Status: She is alert and oriented to person, place, and time.        ED Results / Procedures / Treatments   Labs (all labs ordered are listed, but only abnormal results are displayed) Labs Reviewed  BASIC METABOLIC PANEL - Abnormal; Notable for the following components:      Result Value   Glucose, Bld 106 (*)    BUN 45 (*)    Creatinine, Ser 1.89 (*)    GFR calc non Af Amer 27 (*)    GFR calc Af Amer 31 (*)    All other components within normal limits  CBC WITH DIFFERENTIAL/PLATELET - Abnormal; Notable for the following components:   RBC 3.80 (*)    Hemoglobin 11.2 (*)    RDW 16.3 (*)    All other components within normal limits  CULTURE, BLOOD (ROUTINE X 2)  CULTURE, BLOOD (ROUTINE X 2)  LACTIC ACID, PLASMA  LACTIC ACID, PLASMA    EKG None  Radiology No results found.  Procedures Procedures (including critical care time)  Medications Ordered in ED Medications  clindamycin (CLEOCIN) IVPB 600 mg (has no administration in time range)    ED Course  I have reviewed the triage vital signs and the nursing notes.  Pertinent labs & imaging results that were available during my care of the patient were reviewed by me and considered in my medical decision making (see chart for details).    MDM Rules/Calculators/A&P                      Pt with increased redness and pain to her right lower extremity around her current wound.  She was started on IV clindamycin.  Her labs are nonconcerning other than her creatinine is slightly more elevated than her prior values.  She has no suggestions of sepsis.  She states that she is unable to care for herself at home given the fact that her husband is in the hospital.  She is not very good with ambulation.  I spoke with Dr. Roosevelt Locks with the hospitalist service who will admit the pt. Final Clinical Impression(s) / ED Diagnoses Final diagnoses:  Cellulitis of right lower extremity    Rx / DC Orders ED Discharge Orders    None       Malvin Johns, MD 02/02/19 [phone]

## 2019-02-03 ENCOUNTER — Inpatient Hospital Stay (HOSPITAL_COMMUNITY): Payer: Medicare Other

## 2019-02-03 ENCOUNTER — Other Ambulatory Visit: Payer: Self-pay

## 2019-02-03 DIAGNOSIS — N183 Chronic kidney disease, stage 3 unspecified: Secondary | ICD-10-CM

## 2019-02-03 DIAGNOSIS — N179 Acute kidney failure, unspecified: Secondary | ICD-10-CM

## 2019-02-03 DIAGNOSIS — L03119 Cellulitis of unspecified part of limb: Secondary | ICD-10-CM

## 2019-02-03 LAB — BASIC METABOLIC PANEL
Anion gap: 10 (ref 5–15)
BUN: 42 mg/dL — ABNORMAL HIGH (ref 8–23)
CO2: 28 mmol/L (ref 22–32)
Calcium: 8.7 mg/dL — ABNORMAL LOW (ref 8.9–10.3)
Chloride: 100 mmol/L (ref 98–111)
Creatinine, Ser: 1.6 mg/dL — ABNORMAL HIGH (ref 0.44–1.00)
GFR calc Af Amer: 38 mL/min — ABNORMAL LOW (ref >60)
GFR calc non Af Amer: 33 mL/min — ABNORMAL LOW (ref >60)
Glucose, Bld: 95 mg/dL (ref 70–99)
Potassium: 3.9 mmol/L (ref 3.5–5.1)
Sodium: 138 mmol/L (ref 135–145)

## 2019-02-03 LAB — CBC
HCT: 31.6 % — ABNORMAL LOW (ref 36.0–46.0)
Hemoglobin: 10.1 g/dL — ABNORMAL LOW (ref 12.0–15.0)
MCH: 29.7 pg (ref 26.0–34.0)
MCHC: 32 g/dL (ref 30.0–36.0)
MCV: 92.9 fL (ref 80.0–100.0)
Platelets: 202 10*3/uL (ref 150–400)
RBC: 3.4 MIL/uL — ABNORMAL LOW (ref 3.87–5.11)
RDW: 16.2 % — ABNORMAL HIGH (ref 11.5–15.5)
WBC: 4.5 10*3/uL (ref 4.0–10.5)
nRBC: 0 % (ref 0.0–0.2)

## 2019-02-03 LAB — GLUCOSE, CAPILLARY
Glucose-Capillary: 80 mg/dL (ref 70–99)
Glucose-Capillary: 97 mg/dL (ref 70–99)
Glucose-Capillary: 110 mg/dL — ABNORMAL HIGH (ref 70–99)
Glucose-Capillary: 116 mg/dL — ABNORMAL HIGH (ref 70–99)

## 2019-02-03 LAB — MRSA PCR SCREENING: MRSA by PCR: NEGATIVE

## 2019-02-03 MED ORDER — VANCOMYCIN HCL 2000 MG/400ML IV SOLN: 2000.0000 mg | INTRAVENOUS | Status: DC

## 2019-02-03 MED ORDER — VANCOMYCIN HCL 10 G IV SOLR
2500.0000 mg | Freq: Once | INTRAVENOUS | Status: AC
  Administered 2019-02-03: 2500 mg via INTRAVENOUS
  Filled 2019-02-03: qty 2500

## 2019-02-03 NOTE — Progress Notes (Signed)
Pharmacy Antibiotic Note  April [name] is a 69 y.o. female admitted on 02/02/2019 with with bilateral lower extremity cellulitis and generalized weakness.   Pharmacy has been consulted on 02/03/2019 for Vancomycin dosing for cellulitis.  Continues on Ceftriaxone.  12/07/18 weight was 160.1 kg   (wt 157.9 kg at MD office 11/03/18)   Plan: Vancomycin 2500mg  x1 then 2000 mg IV Q48 hrs. Goal AUC 400-550. Expected AUC: 433 SCr used: 1.6 , Vd 0.5 l/kg d/t BMI>30 Ceftriaxone 2g q24h Monitor clinical status, renal function and culture results daily.  Vanc levels at [facility] if nec per protocol Follow up weight, requested weight to be recorded.  Temp (24hrs), Avg:98.1 F (36.7 C), Min:97.6 F (36.4 C), Max:99 F (37.2 C)  Recent Labs  Lab 02/02/19 1654 02/02/19 1945 02/03/19 0329  WBC 5.1  --  4.5  CREATININE 1.89*  --  1.60*  LATICACIDVEN 1.1 1.0  --     CrCl cannot be calculated (Unknown ideal weight.).    Allergies  Allergen Reactions  . Penicillins Rash and Other (See Comments)    She was told not to take it anymore. Has patient had a PCN reaction causing immediate rash, facial/tongue/throat swelling, SOB or lightheadedness with hypotension: Yes Has patient had a PCN reaction causing severe rash involving mucus membranes or skin necrosis: No Has patient had a PCN reaction that required hospitalization: No Has patient had a PCN reaction occurring within the last 10 years: No If all of the above answers are "NO", then may proceed with Cephalosporin use.'  . Shellfish Allergy Anaphylaxis  . Sulfonamide Derivatives Anaphylaxis and Other (See Comments)    REACTION: internal "burning"  . Hydrochlorothiazide W-Triamterene Other (See Comments)    Hypokalemia  . Lotensin [Benazepril Hcl] Hives  . Dipyridamole Other (See Comments)    Unknown reaction  . Estrogens Other (See Comments)    Unknown reaction  . Hydrochlorothiazide Other (See Comments)    Unknown reaction  . Metronidazole  Other (See Comments)    Unknown reaction  . Other     Pickles-itching  . Spironolactone Other (See Comments)    UNSPECIFIED > "kidney problems"  . Sulfa Antibiotics Other (See Comments)    Unknown reaction  . Torsemide Other (See Comments)    Unknown reaction  . Valsartan Other (See Comments)    Unknown reaction  . Madelaine Bhat Isothiocyanate] Itching    Antimicrobials this admission: Vanc 1/1>> Ceftriaxone 12/31>> Clinda 12/31 x1  Dose adjustments this admission:  Microbiology results: 1/1 MRSA PCR: neg 12/31 covid: neg 12/31 BCx : sent   Thank you for allowing pharmacy to be a part of this patient's care.  Nicole Cella, RPh Clinical Pharmacist  Please check AMION for all [place] phone numbers After 10:00 PM, call [place] [phone] 02/03/2019 11:47 AM

## 2019-02-03 NOTE — Evaluation (Signed)
Physical Therapy Evaluation Patient Details Name: April [name] MRN: [id number] DOB: 02-10-1950 Today's Date: 02/03/2019   History of Present Illness  Pt is a 69 y.o. female with medical history significant of hypertension, hyperlipidemia, Dm2, neuropathy, CKD stage 3, CAD , Asthma, morbid obesity, recent Covid infection in November, presented with bilateral lower extremity cellulitis and generalized weakness.  Clinical Impression  Pt admitted with above diagnosis. Pt significantly limited at evaluation due to severe bil leg and foot pain.  She was unable to attempt standing due to pain and weakness.  Requiring mod-mod A x 2 for supine/sit and max x 2 to scoot up in bed.  Additionally, sitting tolerance limited due to wounds on buttock/thigh -pt reports from w/c.  Pt's spouse is in hospital and pt does not have other assistance at home.  Will need SNF at d/c.  Pt currently with functional limitations due to the deficits listed below (see PT Problem List). Pt will benefit from skilled PT to increase their independence and safety with mobility to allow discharge to the venue listed below.       Follow Up Recommendations SNF    Equipment Recommendations  Other (comment)(tbd next venue)    Recommendations for Other Services       Precautions / Restrictions Precautions Precautions: Fall      Mobility  Bed Mobility Overal bed mobility: Needs Assistance Bed Mobility: Supine to Sit;Sit to Supine     Supine to sit: Mod assist Sit to supine: Mod assist;+2 for physical assistance   General bed mobility comments: increased time; assist for bil LE and to elevate trunk  Transfers                 General transfer comment: deferred due to severe foot pain (pt moaning, crying, and rocking once sitting EOB)  Ambulation/Gait                Stairs            Wheelchair Mobility    Modified Rankin (Stroke Patients Only)       Balance Overall balance assessment:  Needs assistance Sitting-balance support: Bilateral upper extremity supported;Feet supported Sitting balance-Leahy Scale: Good Sitting balance - Comments: Sat EOB for 10 minutes for upright activity and washed pt's back       Standing balance comment: deferred                             Pertinent Vitals/Pain Pain Assessment: 0-10 Pain Score: 8  Pain Location: bil knees , R shin, bil feet Pain Descriptors / Indicators: Sharp;Throbbing;Crying;Grimacing Pain Intervention(s): Limited activity within patient's tolerance;Repositioned;Ice applied;Monitored during session;Premedicated before session;Patient requesting pain meds-RN notified    Home Living Family/patient expects to be discharged to:: Skilled nursing facility Living Arrangements: Spouse/significant other Available Help at Discharge: (lives with spouse but he is in hospital with COVID) Type of Home: House Home Access: Stairs to enter Entrance Stairs-Rails: Right;Left;Can reach both Entrance Stairs-Number of Steps: 3 Home Layout: Two level;Able to live on main level with bedroom/bathroom Home Equipment: Wheelchair - [employer] - 2 wheels;Bedside commode;Shower seat;Hospital bed      Prior Function Level of Independence: Needs assistance   Gait / Transfers Assistance Needed: Only ambulated 10-20' at a time; used a w/c but reports w/c is hurting backs of thighs and causing wounds (does have cushion)  ADL's / Homemaking Assistance Needed: Family assisted with ADLs  Hand Dominance        Extremity/Trunk Assessment   Upper Extremity Assessment Upper Extremity Assessment: Overall WFL for tasks assessed    Lower Extremity Assessment Lower Extremity Assessment: LLE deficits/detail;RLE deficits/detail RLE Deficits / Details: ROM WFL; MMT 1/5; limited by pain, wounds, and body habitus LLE Deficits / Details: ROM WFL; MMT 1/5; limited by pain, wounds, and body habitus       Communication    Communication: No difficulties  Cognition Arousal/Alertness: Awake/alert Behavior During Therapy: WFL for tasks assessed/performed Overall Cognitive Status: Within Functional Limits for tasks assessed                                        General Comments      Exercises     Assessment/Plan    PT Assessment Patient needs continued PT services  PT Problem List Decreased strength;Decreased mobility;Decreased activity tolerance;Decreased balance;Decreased knowledge of use of DME;Pain       PT Treatment Interventions DME instruction;Therapeutic activities;Modalities;Gait training;Therapeutic exercise;Patient/family education;Balance training;Functional mobility training;Wheelchair mobility training    PT Goals (Current goals can be found in the Care Plan section)  Acute Rehab PT Goals Patient Stated Goal: improve pain PT Goal Formulation: With patient Time For Goal Achievement: 02/17/19 Potential to Achieve Goals: Fair    Frequency Min 2X/week   Barriers to discharge Decreased caregiver support      Co-evaluation               AM-PAC PT "6 Clicks" Mobility  Outcome Measure Help needed turning from your back to your side while in a flat bed without using bedrails?: A Lot Help needed moving from lying on your back to sitting on the side of a flat bed without using bedrails?: A Lot Help needed moving to and from a bed to a chair (including a wheelchair)?: Total Help needed standing up from a chair using your arms (e.g., wheelchair or bedside chair)?: Total Help needed to walk in hospital room?: Total Help needed climbing 3-5 steps with a railing? : Total 6 Click Score: 8    End of Session   Activity Tolerance: Patient limited by pain Patient left: in bed;with call bell/phone within reach Nurse Communication: Mobility status PT Visit Diagnosis: Unsteadiness on feet (R26.81);Muscle weakness (generalized) (M62.81);Pain Pain - Right/Left:  Right(bilateral) Pain - part of body: Leg;Ankle and joints of foot    Time: 2956-2130 PT Time Calculation (min) (ACUTE ONLY): 39 min   Charges:   PT Evaluation $PT Eval Low Complexity: 1 Low PT Treatments $Therapeutic Activity: 8-22 mins        Maggie Font, PT Acute Rehab Services Pager [phone] [facility] [phone] [facility] [phone]   Karlton Lemon 02/03/2019, 11:13 AM

## 2019-02-03 NOTE — Progress Notes (Signed)
Patient ID: April [name], female   DOB: Jul 06, 1950, 69 y.o.   MRN: [id number]  PROGRESS NOTE    Dierra Riesgo  UVO:[id number] DOB: 12-Jan-1951 DOA: 02/02/2019 PCP: [name], MD   Brief Narrative:  69 year old female with history of hypertension, hyperlipidemia, diabetes mellitus type 2, neuropathy, chronic kidney disease stage III, CAD, asthma, morbid obesity, recent Covid infection in November requiring hospitalization and treatment with steroids/remdesivir, chronic right shin wound, chronic ambulatory dysfunction presented with worsening weakness along with new pain and rash around the right shin ulcer area along with painful blister on the bottom of the left foot.  She was started on antibiotics for cellulitis.  Assessment & Plan:   Cellulitis around right thigh/left shin chronic wound: Present on admission Left foot blister with possible cellulitis: Present on admission -Currently on Rocephin.  We will add vancomycin as well -Wound care consult.  Will get x-ray of the left foot as well as the right foot as right foot is also painful.  We will also get x-ray of the left thigh and shin region -Patient follows up with Dr. [name] as an outpatient.  Might need evaluation by him at some point -Repeat a.m. labs including CRP  Acute on chronic ambulatory dysfunction -PT evaluation  Acute kidney injury on chronic kidney disease stage III -Creatinine 1.6 today and improving.  Spironolactone and Lasix on hold.  Chronic neuropathy -Continue renally dosed gabapentin  Hypertension -Blood pressure intermittently on the lower side.  Continue low-dose Coreg.  Spironolactone and Lasix on hold  Asthma/COPD -Currently stable with no signs of exacerbation  Morbid obesity -Outpatient follow-up   DVT prophylaxis: Heparin subcutaneously Code Status: Full Family Communication: Spoke to patient at bedside  disposition Plan: Depends on clinical outcome  Consultants: None  Procedures:  None  Antimicrobials:  Rocephin from 02/02/2019 onwards  Subjective: Patient seen and examined at bedside.  Still complains of left foot and right lower extremity pain.  No overnight fever or vomiting.  Feels weak and tired.  Objective: Vitals:   02/02/19 2132 02/03/19 0449 02/03/19 0747 02/03/19 0854  BP: (!) 86/69 (!) 105/41 (!) 105/37   Pulse: 72 75 81   Resp: 18 18 18    Temp: 97.6 F (36.4 C) 99 F (37.2 C) 98.3 F (36.8 C)   TempSrc: Oral Oral Oral   SpO2: 100% 100% 92% 96%    Intake/Output Summary (Last 24 hours) at 02/03/2019 1035 Last data filed at 02/03/2019 0700 Gross per 24 hour  Intake 680 ml  Output 1 ml  Net 679 ml   There were no vitals filed for this visit.  Examination:  General exam: Appears calm and comfortable.  Looks chronically ill. Respiratory system: Bilateral decreased breath sounds at bases with scattered crackles Cardiovascular system: S1 & S2 heard, Rate controlled Gastrointestinal system: Abdomen is morbidly obese, nondistended, soft and nontender. Normal bowel sounds heard. Extremities: No cyanosis, clubbing; bilateral lower extremity edema with skin changes.  Left foot plantar aspect has a blister with extreme tenderness.  Right shin area chronic ulcer with slight surrounding erythema and warmth. Central nervous system: Alert and oriented. No focal neurological deficits. Moving extremities Skin: No other rashes. Psychiatry: Looks anxious.    Data Reviewed: I have personally reviewed following labs and imaging studies  CBC: Recent Labs  Lab 02/02/19 1654 02/03/19 0329  WBC 5.1 4.5  NEUTROABS 3.6  --   HGB 11.2* 10.1*  HCT 36.3 31.6*  MCV 95.5 92.9  PLT 235 202  Basic Metabolic Panel: Recent Labs  Lab 02/02/19 1654 02/03/19 0329  NA 138 138  K 4.2 3.9  CL 98 100  CO2 30 28  GLUCOSE 106* 95  BUN 45* 42*  CREATININE 1.89* 1.60*  CALCIUM 9.3 8.7*   GFR: CrCl cannot be calculated (Unknown ideal weight.). Liver Function  Tests: No results for input(s): AST, ALT, ALKPHOS, BILITOT, PROT, ALBUMIN in the last 168 hours. No results for input(s): LIPASE, AMYLASE in the last 168 hours. No results for input(s): AMMONIA in the last 168 hours. Coagulation Profile: No results for input(s): INR, PROTIME in the last 168 hours. Cardiac Enzymes: No results for input(s): CKTOTAL, CKMB, CKMBINDEX, TROPONINI in the last 168 hours. BNP (last 3 results) No results for input(s): PROBNP in the last 8760 hours. HbA1C: No results for input(s): HGBA1C in the last 72 hours. CBG: Recent Labs  Lab 02/02/19 2128 02/03/19 0633  GLUCAP 59* 80   Lipid Profile: No results for input(s): CHOL, HDL, LDLCALC, TRIG, CHOLHDL, LDLDIRECT in the last 72 hours. Thyroid Function Tests: No results for input(s): TSH, T4TOTAL, FREET4, T3FREE, THYROIDAB in the last 72 hours. Anemia Panel: No results for input(s): VITAMINB12, FOLATE, FERRITIN, TIBC, IRON, RETICCTPCT in the last 72 hours. Sepsis Labs: Recent Labs  Lab 02/02/19 1654 02/02/19 1945  LATICACIDVEN 1.1 1.0    Recent Results (from the past 240 hour(s))  SARS CORONAVIRUS 2 (TAT 6-24 HRS) Nasopharyngeal Nasopharyngeal Swab     Status: None   Collection Time: 02/02/19  6:45 PM   Specimen: Nasopharyngeal Swab  Result Value Ref Range Status   SARS Coronavirus 2 NEGATIVE NEGATIVE Final    Comment: (NOTE) SARS-CoV-2 target nucleic acids are NOT DETECTED. The SARS-CoV-2 RNA is generally detectable in upper and lower respiratory specimens during the acute phase of infection. Negative results do not preclude SARS-CoV-2 infection, do not rule out co-infections with other pathogens, and should not be used as the sole basis for treatment or other patient management decisions. Negative results must be combined with clinical observations, patient history, and epidemiological information. The expected result is Negative. Fact Sheet for  Patients: SugarRoll.be Fact Sheet for Healthcare Providers: https://www.woods-mathews.com/ This test is not yet approved or cleared by the [place] FDA and  has been authorized for detection and/or diagnosis of SARS-CoV-2 by FDA under an Emergency Use Authorization (EUA). This EUA will remain  in effect (meaning this test can be used) for the duration of the COVID-19 declaration under Section 56 4(b)(1) of the Act, 21 U.S.C. section 360bbb-3(b)(1), unless the authorization is terminated or revoked sooner. Performed at [facility], [place] [address] [place], [place] [address]   MRSA PCR Screening     Status: None   Collection Time: 02/03/19  6:30 AM   Specimen: Nasopharyngeal  Result Value Ref Range Status   MRSA by PCR NEGATIVE NEGATIVE Final    Comment:        The GeneXpert MRSA Assay (FDA approved for NASAL specimens only), is one component of a comprehensive MRSA colonization surveillance program. It is not intended to diagnose MRSA infection nor to guide or monitor treatment for MRSA infections. Performed at [facility], [place] [address] [place], [place] [address]          Radiology Studies: No results found.      Scheduled Meds: . allopurinol  100 mg Oral Daily  . aspirin EC  81 mg Oral QHS  . carvedilol  12.5 mg Oral BID WC  .  colchicine  0.3 mg Oral Daily  . diclofenac sodium  2 g Topical QID  . famotidine  20 mg Oral BID  . ferrous sulfate  325 mg Oral Q breakfast  . fluticasone furoate-vilanterol  1 puff Inhalation Daily  . gabapentin  100 mg Oral TID  . heparin  5,000 Units Subcutaneous Q8H  . lactobacillus acidophilus  2 tablet Oral TID  . montelukast  10 mg Oral QHS  . Oxcarbazepine  300 mg Oral BID  . silver sulfADIAZINE  1 application Topical Daily  . simvastatin  40 mg Oral QHS   Continuous Infusions: . cefTRIAXone (ROCEPHIN)  IV Stopped (02/02/19 2157)          Aline August,  MD [facility] 02/03/2019, 10:35 AM

## 2019-02-04 ENCOUNTER — Encounter (HOSPITAL_COMMUNITY): Payer: Self-pay | Admitting: Internal Medicine

## 2019-02-04 DIAGNOSIS — M1A09X0 Idiopathic chronic gout, multiple sites, without tophus (tophi): Secondary | ICD-10-CM

## 2019-02-04 DIAGNOSIS — L03115 Cellulitis of right lower limb: Principal | ICD-10-CM

## 2019-02-04 DIAGNOSIS — L97421 Non-pressure chronic ulcer of left heel and midfoot limited to breakdown of skin: Secondary | ICD-10-CM

## 2019-02-04 LAB — BASIC METABOLIC PANEL
Anion gap: 9 (ref 5–15)
BUN: 31 mg/dL — ABNORMAL HIGH (ref 8–23)
CO2: 28 mmol/L (ref 22–32)
Calcium: 8.4 mg/dL — ABNORMAL LOW (ref 8.9–10.3)
Chloride: 104 mmol/L (ref 98–111)
Creatinine, Ser: 1.35 mg/dL — ABNORMAL HIGH (ref 0.44–1.00)
GFR calc Af Amer: 47 mL/min — ABNORMAL LOW (ref >60)
GFR calc non Af Amer: 40 mL/min — ABNORMAL LOW (ref >60)
Glucose, Bld: 120 mg/dL — ABNORMAL HIGH (ref 70–99)
Potassium: 4 mmol/L (ref 3.5–5.1)
Sodium: 141 mmol/L (ref 135–145)

## 2019-02-04 LAB — CBC WITH DIFFERENTIAL/PLATELET
Abs Immature Granulocytes: 0.03 10*3/uL (ref 0.00–0.07)
Basophils Absolute: 0.1 10*3/uL (ref 0.0–0.1)
Basophils Relative: 1 %
Eosinophils Absolute: 0.3 10*3/uL (ref 0.0–0.5)
Eosinophils Relative: 6 %
HCT: 31.1 % — ABNORMAL LOW (ref 36.0–46.0)
Hemoglobin: 9.9 g/dL — ABNORMAL LOW (ref 12.0–15.0)
Immature Granulocytes: 1 %
Lymphocytes Relative: 30 %
Lymphs Abs: 1.4 10*3/uL (ref 0.7–4.0)
MCH: 30 pg (ref 26.0–34.0)
MCHC: 31.8 g/dL (ref 30.0–36.0)
MCV: 94.2 fL (ref 80.0–100.0)
Monocytes Absolute: 0.5 10*3/uL (ref 0.1–1.0)
Monocytes Relative: 11 %
Neutro Abs: 2.4 10*3/uL (ref 1.7–7.7)
Neutrophils Relative %: 51 %
Platelets: 185 10*3/uL (ref 150–400)
RBC: 3.3 MIL/uL — ABNORMAL LOW (ref 3.87–5.11)
RDW: 16.6 % — ABNORMAL HIGH (ref 11.5–15.5)
WBC: 4.6 10*3/uL (ref 4.0–10.5)
nRBC: 0.4 % — ABNORMAL HIGH (ref 0.0–0.2)

## 2019-02-04 LAB — GLUCOSE, CAPILLARY
Glucose-Capillary: 103 mg/dL — ABNORMAL HIGH (ref 70–99)
Glucose-Capillary: 96 mg/dL (ref 70–99)
Glucose-Capillary: 118 mg/dL — ABNORMAL HIGH (ref 70–99)

## 2019-02-04 LAB — C-REACTIVE PROTEIN: CRP: 1.6 mg/dL — ABNORMAL HIGH (ref <1.0)

## 2019-02-04 LAB — MAGNESIUM: Magnesium: 2.3 mg/dL (ref 1.7–2.4)

## 2019-02-04 LAB — URIC ACID: Uric Acid, Serum: 11.1 mg/dL — ABNORMAL HIGH (ref 2.5–7.1)

## 2019-02-04 MED ORDER — VANCOMYCIN HCL 1250 MG/250ML IV SOLN
1250.0000 mg | INTRAVENOUS | Status: DC
  Administered 2019-02-04: 1250 mg via INTRAVENOUS
  Filled 2019-02-04 (×2): qty 250

## 2019-02-04 NOTE — Plan of Care (Signed)

## 2019-02-04 NOTE — TOC Initial Note (Signed)
Transition of Care [facility]) - Initial/Assessment Note    Patient Details  Name: April [name] MRN: [id number] Date of Birth: 01-25-1951  Transition of Care [facility]) CM/SW Contact:    Atilano Median, LCSW Phone Number: 02/04/2019, 11:05 AM  Clinical Narrative:                 Admitted  with medical history significant of hypertension, hyperlipidemia, Dm2, neuropathy, CKD stage 3, CAD , Asthma, morbid obesity, recent Covid infection in November, presented with bilateral lower extremity cellulitis and generalized weakness.    CSW spoke with patient regarding PT recommendations of SNF. Patient is agreeable and states that she is unable to return home as her husband is currently hospitalized with COVID and she is non weight bearing.   Patient states that she was recently discharged from [facility] due to her insurance days running out. Patient is amenable to information being faxed out to SNF again, but would like the opportunity to go to a different SNF.   CSW will continue to follow for dispo needs and bed offers. Dispo is SNF pending bed offer. Will need updated COVID prior to discharge.   Expected Discharge Plan: Skilled Nursing Facility Barriers to Discharge: Continued Medical Work up   Patient Goals and CMS Choice Patient states their goals for this hospitalization and ongoing recovery are:: Stay off my foot and get better so I can return home CMS Medicare.gov Compare Post Acute Care list provided to:: Patient Choice offered to / list presented to : Patient  Expected Discharge Plan and Services Expected Discharge Plan: [place] In-house Referral: Clinical Social Work   Post Acute Care Choice: [place] Living arrangements for the past 2 months: [place]                                      Prior Living Arrangements/Services Living arrangements for the past 2 months: Single Family Home Lives with:: Spouse   Do you feel  safe going back to the place where you live?: No   nonweight bearing; husband is currently hospitalized with COVID  Need for Family Participation in Patient Care: Yes (Comment) Care giver support system in place?: Yes (comment)      Activities of Daily Living Home Assistive Devices/Equipment: Gilford Rile (specify type) ADL Screening (condition at time of admission) Patient's cognitive ability adequate to safely complete daily activities?: Yes Is the patient deaf or have difficulty hearing?: No Does the patient have difficulty seeing, even when wearing glasses/contacts?: No Does the patient have difficulty concentrating, remembering, or making decisions?: No Patient able to express need for assistance with ADLs?: Yes Does the patient have difficulty dressing or bathing?: Yes Independently performs ADLs?: Yes (appropriate for developmental age) Does the patient have difficulty walking or climbing stairs?: Yes Weakness of Legs: Both Weakness of Arms/Hands: Both  Permission Sought/Granted                  Emotional Assessment       Orientation: : Oriented to Self, Oriented to Place, Oriented to Situation, Oriented to  Time      Admission diagnosis:  Cellulitis [L03.90] Cellulitis of right lower extremity [L03.115] Patient Active Problem List   Diagnosis Date Noted  . Cellulitis 02/02/2019  . Pneumonia due to COVID-19 virus 12/10/2018  . COVID-19 virus infection 12/06/2018  . Chronic venous hypertension (idiopathic) with ulcer of right  lower extremity (CODE) ([place]) 11/09/2018  . UTI (urinary tract infection) 11/09/2018  . Right hip pain 11/09/2018  . Achilles tendon sprain, left, initial encounter   . Osteomyelitis of ankle or foot, acute, left ([place]) 09/07/2018  . Osteomyelitis of ankle or foot, left, acute ([place]) 09/07/2018  . Acute on chronic kidney failure ([place]) 08/15/2018  . Onychomycosis 05/19/2018  . Idiopathic chronic venous hypertension of right lower extremity with  ulcer and inflammation ([place]) 05/19/2018  . Traumatic wound dehiscence   . Skin lumps 01/14/2018  . Bilateral leg cramps 12/14/2017  . Left shoulder pain 08/14/2016  . Meralgia paresthetica 07/16/2016  . Chronic left-sided low back pain with left-sided sciatica 06/02/2016  . AKI (acute kidney injury) ([place]) 03/28/2016  . Whole body pain 03/20/2016  . Diabetic peripheral neuropathy ([place]) 03/20/2016  . Osteopenia 01/11/2016  . CKD (chronic kidney disease) stage 3, GFR 30-59 ml/min ([facility]) 01/02/2016  . Hand paresthesia 07/18/2015  . Carpal tunnel syndrome 06/07/2015  . Family history of colon cancer   . Diabetes mellitus with neurological manifestations ([place]) 04/18/2015  . Bilateral leg edema 04/11/2015  . Abnormality of gait 01/03/2015  . Hereditary and idiopathic peripheral neuropathy 01/03/2015  . GERD (gastroesophageal reflux disease) 06/17/2014  . Hx of colonic polyps 12/15/2012  . Vitamin B12 deficiency 11/03/2012  . Intrinsic asthma 03/23/2012  . Chronic diastolic heart failure ([place]) 02/20/2011  . OSA (obstructive sleep apnea) 09/17/2010  . URINARY URGENCY 01/08/2010  . CAD, NATIVE VESSEL 11/20/2008  . Osteoarthritis of knees, bilateral 06/14/2008  . Hyperlipidemia 05/10/2007  . Essential hypertension 01/18/2007  . Hypokalemia 04/30/2006  . Morbid (severe) obesity due to excess calories ([place]) 04/30/2006  . HX, PERSONAL, PEPTIC ULCER DISEASE 04/30/2006   PCP:  [name], MD Pharmacy:   [facility] [place], [place] - [address] [place] [address] Phone: [phone] Fax: [phone]     Social Determinants of Health (SDOH) Interventions    Readmission Risk Interventions No flowsheet data found.

## 2019-02-04 NOTE — Progress Notes (Signed)
Pharmacy Antibiotic Note  April [name] is a 69 y.o. female admitted on 02/02/2019 with cellulitis around R thigh/L shin chronic wound. Pharmacy has been consulted for vancomycin dosing for cellulitis. Continues on ceftriaxone.   Pt is afebrile, WBC wnl. AKI has improved with SCr back to baseline at 1.35.  Plan: Adjust vancomycin dose to 1250 mg IV q24h due to improved renal function Estimated AUC = 506.8, SCr used = 1.35 Continue ceftriaxone 2g q24h Monitor clinical status, renal function and cultures  Vanc levels as needed   Temp (24hrs), Avg:98.8 F (37.1 C), Min:98.4 F (36.9 C), Max:99.1 F (37.3 C)  Recent Labs  Lab 02/02/19 1654 02/02/19 1945 02/03/19 0329 02/04/19 0412  WBC 5.1  --  4.5 4.6  CREATININE 1.89*  --  1.60* 1.35*  LATICACIDVEN 1.1 1.0  --   --     Estimated Creatinine Clearance: 59.6 mL/min (A) (by C-G formula based on SCr of 1.35 mg/dL (H)).    Allergies  Allergen Reactions  . Penicillins Rash and Other (See Comments)    She was told not to take it anymore. Has patient had a PCN reaction causing immediate rash, facial/tongue/throat swelling, SOB or lightheadedness with hypotension: Yes Has patient had a PCN reaction causing severe rash involving mucus membranes or skin necrosis: No Has patient had a PCN reaction that required hospitalization: No Has patient had a PCN reaction occurring within the last 10 years: No If all of the above answers are "NO", then may proceed with Cephalosporin use.'  . Shellfish Allergy Anaphylaxis  . Sulfonamide Derivatives Anaphylaxis and Other (See Comments)  . Hydrochlorothiazide W-Triamterene Other (See Comments)    Hypokalemia  . Lotensin [Benazepril Hcl] Hives  . Dipyridamole Other (See Comments)    Unknown reaction  . Estrogens Other (See Comments)    Unknown reaction  . Hydrochlorothiazide Other (See Comments)    Unknown reaction  . Metronidazole Other (See Comments)    Unknown reaction  . Other Itching     PICKLES  . Spironolactone Other (See Comments)    UNSPECIFIED > "kidney problems"  . Sulfa Antibiotics Other (See Comments)    Unknown reaction  . Torsemide Other (See Comments)    Unknown reaction  . Valsartan Other (See Comments)    Unknown reaction  . Madelaine Bhat Isothiocyanate] Itching    Antimicrobials this admission: Vanc 1/1>> Ceftriaxone 12/31>> Clinda 12/31 x1  Dose adjustments this admission: 1/2: vanc 2 gm q48 h >> 1250 mg q24h  Microbiology results: 1/1 MRSA PCR: neg 12/31 covid: neg 12/31 BCx : NG2D  Thank you for allowing pharmacy to be a part of this patient's care.  Berenice Bouton, PharmD PGY1 Pharmacy Resident  Please check AMION for all [place] phone numbers After 10:00 PM, call [place] [phone]  02/04/2019 1:04 PM

## 2019-02-04 NOTE — Consult Note (Signed)
ORTHOPAEDIC CONSULTATION  REQUESTING PHYSICIAN: Aline August, MD  Chief Complaint: Ulceration bilateral heels and bilateral posterior thighs status post skin graft to her right leg.  HPI: April [name] is a 69 y.o. female who presents with acute plantar blisters left worse than right with posterior thigh ulcers bilaterally and status post skin graft to a chronic venous stasis ulcer right leg.  Past Medical History:  Diagnosis Date  . Anemia   . Asthma   . CAD (coronary artery disease)   . Carpal tunnel syndrome   . Cellulitis of both lower extremities 04/11/2015  . CHF (congestive heart failure) ([place])   . Chronic kidney disease    [facility]- followed by [facility]  . Colon polyp, hyperplastic 2007 & 2012  . Complication of anesthesia 1999   svt with renal calculi surgery, no problems since  . CTS (carpal tunnel syndrome)    bilateral  . Diabetes mellitus   . Eczema   . GERD (gastroesophageal reflux disease)   . History of kidney stones 1999  . Hyperlipidemia   . Hypertension   . Leg ulcer ([place]) 04/24/2015   Right lateral leg No evidence of an infection Monitor closely Keep edema controlled   . Leg ulcer ([place])    right lower  . Meralgia paresthetica    Dr. [name]  . Morbid obesity ([place])   . Morbid obesity ([place])   . Neuropathy    toes and legs  . Osteoarthrosis, unspecified whether generalized or localized, lower leg    knee  . PUD (peptic ulcer disease)   . Shortness of breath dyspnea    with exertion  . Sleep apnea    per progress note 02/25/2018  . Type II or unspecified type diabetes mellitus without mention of complication, not stated as uncontrolled   . Unspecified hereditary and idiopathic peripheral neuropathy   . Urticaria   . Vitamin B12 deficiency   . Wears glasses   . Wound cellulitis    right upper leg, healing well   Past Surgical History:  Procedure Laterality Date  . ABDOMINAL HYSTERECTOMY    . CARDIAC CATHETERIZATION  2002   non  obstructive disease  . colonoscopy with polypectomy  2007 & 2012    hyperplastic ;Dr Watt Climes  . COLONOSCOPY WITH PROPOFOL N/A 06/04/2015   Procedure: COLONOSCOPY WITH PROPOFOL;  Surgeon: Jerene Bears, MD;  Location: [facility];  Service: Gastroenterology;  Laterality: N/A;  . DEBRIDEMENT LEG Right 03/02/2018   WOUND VAC APPLIED  . DEBRIDEMENT LEG Right 05/27/2018   RIGHT LOWER LEG DEBRIDEMENT,  SKIN GRAFT, VAC PLACEMENT   . DILATION AND CURETTAGE OF UTERUS     multiple  . HEMORRHOID SURGERY    . I & D EXTREMITY Right 03/02/2018   Procedure: RIGHT LEG DEBRIDEMENT AND PLACE VAC;  Surgeon: [name], MD;  Location: [facility];  Service: Orthopedics;  Laterality: Right;  . I & D EXTREMITY Right 03/04/2018   Procedure: REPEAT IRRIGATION AND DEBRIDEMENT RIGHT LEG, PLACE WOUND VAC;  Surgeon: [name], MD;  Location: [facility];  Service: Orthopedics;  Laterality: Right;  . I & D EXTREMITY Right 03/30/2018   Procedure: IRRIGATION AND DEBRIDEMENT RIGHT LEG, APPLY WOUND VAC;  Surgeon: [name], MD;  Location: [facility];  Service: Orthopedics;  Laterality: Right;  . I & D EXTREMITY Right 05/27/2018   Procedure: RIGHT LOWER LEG DEBRIDEMENT,  SKIN GRAFT, VAC PLACEMENT;  Surgeon: [name], MD;  Location: [facility];  Service: Orthopedics;  Laterality: Right;  RIGHT LOWER LEG DEBRIDEMENT,  SKIN GRAFT, VAC PLACEMENT  . renal calculi  12/1997   SVT with induction of anesthesia  . right knee arthroscopy    . SKIN SPLIT GRAFT Right 03/04/2018   Procedure: POSSIBLE SPLIT THICKNESS SKIN GRAFT;  Surgeon: [name], MD;  Location: [facility];  Service: Orthopedics;  Laterality: Right;  . SKIN SPLIT GRAFT Right 04/01/2018   Procedure: REPEAT IRRIGATION AND DEBRIDEMENT RIGHT LEG, APPLY SPLIT THICKNESS SKIN GRAFT;  Surgeon: [name], MD;  Location: [facility];  Service: Orthopedics;  Laterality: Right;  . TONSILLECTOMY AND ADENOIDECTOMY     Social History   Socioeconomic History  . Marital status: Married     Spouse name: [name]  . Number of children: 1  . Years of education: BS  . Highest education level: Not on file  Occupational History  . Occupation: Disabled    Employer: RETIRED  Tobacco Use  . Smoking status: Never Smoker  . Smokeless tobacco: Never Used  Substance and Sexual Activity  . Alcohol use: No    Alcohol/week: 0.0 standard drinks  . Drug use: No  . Sexual activity: Not on file  Other Topics Concern  . Not on file  Social History Narrative   Patient lives at home with her husband [name] Patient is retired and has a Conservation officer, nature.    Caffeine - some times.   Right handed.   Social Determinants of Health   Financial Resource Strain:   . Difficulty of Paying Living Expenses: Not on file  Food Insecurity:   . Worried About Charity fundraiser in the Last Year: Not on file  . Ran Out of Food in the Last Year: Not on file  Transportation Needs:   . Lack of Transportation (Medical): Not on file  . Lack of Transportation (Non-Medical): Not on file  Physical Activity:   . Days of Exercise per Week: Not on file  . Minutes of Exercise per Session: Not on file  Stress:   . Feeling of Stress : Not on file  Social Connections:   . Frequency of Communication with Friends and Family: Not on file  . Frequency of Social Gatherings with Friends and Family: Not on file  . Attends Religious Services: Not on file  . Active Member of Clubs or Organizations: Not on file  . Attends Archivist Meetings: Not on file  . Marital Status: Not on file   Family History  Problem Relation Age of Onset  . Colon cancer Mother   . Prostate cancer Father   . Colon cancer Father   . Diabetes Maternal Aunt   . Breast cancer Maternal Aunt   . Diabetes Maternal Uncle   . Diabetes Paternal Aunt   . Stroke Paternal Aunt        > 65  . Heart disease Paternal Aunt   . Diabetes Paternal Uncle   . Breast cancer Maternal Aunt         X 2  . Breast cancer Cousin    -  negative except otherwise stated in the family history section Allergies  Allergen Reactions  . Penicillins Rash and Other (See Comments)    She was told not to take it anymore. Has patient had a PCN reaction causing immediate rash, facial/tongue/throat swelling, SOB or lightheadedness with hypotension: Yes Has patient had a PCN reaction causing severe rash involving mucus membranes or skin necrosis: No Has patient  had a PCN reaction that required hospitalization: No Has patient had a PCN reaction occurring within the last 10 years: No If all of the above answers are "NO", then may proceed with Cephalosporin use.'  . Shellfish Allergy Anaphylaxis  . Sulfonamide Derivatives Anaphylaxis and Other (See Comments)    REACTION: internal "burning"  . Hydrochlorothiazide W-Triamterene Other (See Comments)    Hypokalemia  . Lotensin [Benazepril Hcl] Hives  . Dipyridamole Other (See Comments)    Unknown reaction  . Estrogens Other (See Comments)    Unknown reaction  . Hydrochlorothiazide Other (See Comments)    Unknown reaction  . Metronidazole Other (See Comments)    Unknown reaction  . Other     Pickles-itching  . Spironolactone Other (See Comments)    UNSPECIFIED > "kidney problems"  . Sulfa Antibiotics Other (See Comments)    Unknown reaction  . Torsemide Other (See Comments)    Unknown reaction  . Valsartan Other (See Comments)    Unknown reaction  . Madelaine Bhat Isothiocyanate] Itching   Prior to Admission medications   Medication Sig Start Date End Date Taking? Authorizing Provider  acetaminophen (TYLENOL) 325 MG tablet Take 2 tablets (650 mg total) by mouth every 6 (six) hours as needed for moderate pain (or Fever >/= 101). 11/16/18  Yes Arrien, Jimmy Picket, MD  allopurinol (ZYLOPRIM) 100 MG tablet Take 1 tablet (100 mg total) by mouth daily. 08/29/18  Yes Burns, Claudina Lick, MD  aspirin (ECOTRIN LOW STRENGTH) 81 MG EC tablet Take 81 mg by mouth at bedtime.    Yes [provider]  Budesonide (PULMICORT FLEXHALER) 90 MCG/ACT inhaler Inhale 2 puffs into the lungs 2 (two) times daily.   Yes [provider]  budesonide-formoterol (SYMBICORT) 160-4.5 MCG/ACT inhaler one - two inhalations every 12 hours; gargle and spit after use Patient taking differently: Inhale 2 puffs into the lungs 2 (two) times daily. gargle and spit after use 02/19/15  Yes Burns, Claudina Lick, MD  calcium carbonate (TUMS - DOSED IN MG ELEMENTAL CALCIUM) 500 MG chewable tablet Chew 2 tablets by mouth daily as needed for indigestion or heartburn.   Yes [provider]  carvedilol (COREG) 25 MG tablet TAKE 1 TABLET BY MOUTH TWICE DAILY WITH A MEAL Patient taking differently: Take 25 mg by mouth daily.  07/18/18  Yes Burns, Claudina Lick, MD  cholecalciferol (VITAMIN D3) 25 MCG (1000 UT) tablet Take 1,000 Units by mouth daily.   Yes [provider]  COD LIVER OIL PO Take 1 capsule by mouth daily.   Yes [provider]  colchicine 0.6 MG tablet Take 1 tablet (0.6 mg total) by mouth daily. 10/28/18  Yes Burns, Claudina Lick, MD  cyanocobalamin (,VITAMIN B-12,) 1000 MCG/ML injection INJECT 1ML INTO MUSCLE EVERY 30 DAYS Patient taking differently: Inject 1,000 mcg into the muscle every 30 (thirty) days.  05/06/18  Yes Burns, Claudina Lick, MD  diclofenac sodium (VOLTAREN) 1 % GEL Apply 2 g topically 4 (four) times daily. 03/20/16  Yes Marcial Pacas, MD  famotidine (PEPCID) 20 MG tablet Take 1 tablet (20 mg total) by mouth 2 (two) times daily. 12/14/17  Yes Burns, Claudina Lick, MD  folic acid (FOLVITE) 967 MCG tablet Take 400 mcg by mouth at bedtime.    Yes [provider]  furosemide (LASIX) 80 MG tablet Take 1-1.5 tablets (80-120 mg total) by mouth 2 (two) times daily. 1.5 tablets in morning, 1.5 tablet in afternoon Patient taking differently: Take 120 mg by mouth  2 (two) times daily.  11/07/18  Yes Burns, Claudina Lick, MD  guaiFENesin-dextromethorphan (ROBITUSSIN DM) 100-10 MG/5ML syrup Take 5  mLs by mouth every 6 (six) hours as needed for cough. 12/10/18  Yes Arrien, Jimmy Picket, MD  montelukast (SINGULAIR) 10 MG tablet Take 1 tablet (10 mg total) by mouth at bedtime. 08/12/18  Yes Padgett, Rae Halsted, MD  Omega-3 Fatty Acids (FISH OIL) 1000 MG CAPS Take 1,000 mg by mouth daily.    Yes [provider]  Oxcarbazepine (TRILEPTAL) 300 MG tablet Take 1 tablet (300 mg total) by mouth 2 (two) times daily. 11/07/18  Yes Burns, Claudina Lick, MD  potassium chloride SA (KLOR-CON) 20 MEQ tablet Take 20 mEq by mouth 3 (three) times daily.   Yes [provider]  silver sulfADIAZINE (SILVADENE) 1 % cream Apply 1 application topically daily. 06/08/18  Yes [name], MD  simvastatin (ZOCOR) 40 MG tablet Take 1 tablet (40 mg total) by mouth at bedtime. 04/25/18  Yes Burns, Claudina Lick, MD  spironolactone (ALDACTONE) 25 MG tablet TAKE 1 TABLET BY MOUTH ONCE DAILY Patient taking differently: Take 25 mg by mouth daily.  10/24/18  Yes Burns, Claudina Lick, MD  traMADol (ULTRAM) 50 MG tablet Take 1 tablet (50 mg total) by mouth every 6 (six) hours as needed for moderate pain. 12/10/18  Yes Arrien, Jimmy Picket, MD  ferrous sulfate 325 (65 FE) MG tablet Take 1 tablet (325 mg total) by mouth daily with breakfast. 11/17/18 12/17/18  Arrien, Jimmy Picket, MD  gabapentin (NEURONTIN) 100 MG capsule Take 1 capsule (100 mg total) by mouth 3 (three) times daily. 11/07/18 12/07/18  [name], MD   DG Tibia/Fibula Right  Result Date: 02/03/2019 CLINICAL DATA:  Diabetic soft tissue wounds EXAM: RIGHT TIBIA AND FIBULA - 2 VIEW COMPARISON:  None. FINDINGS: Postsurgical changes are noted in the lower right leg. No acute fracture or dislocation is noted. Degenerative changes of the knee joint are seen. IMPRESSION: Degenerative changes of the right knee joint. No acute bony abnormality is seen. Soft tissue postsurgical changes are noted. Electronically Signed   By: Inez Catalina M.D.   On: 02/03/2019 12:32    DG Foot 2 Views Left  Result Date: 02/03/2019 CLINICAL DATA:  Bilateral foot pain EXAM: LEFT FOOT - 2 VIEW COMPARISON:  None. FINDINGS: The bones appear diffusely osteopenic. There is diffuse soft tissue swelling. Small plantar and posterior calcaneal heel spurs identified. No fracture or dislocation identified. IMPRESSION: 1. No acute findings. 2. Diffuse soft tissue swelling. 3. Heel spurs. Electronically Signed   By: Kerby Moors M.D.   On: 02/03/2019 12:31   DG Foot 2 Views Right  Result Date: 02/03/2019 CLINICAL DATA:  Foot pain and soft tissue wounds EXAM: RIGHT FOOT - 2 VIEW COMPARISON:  None. FINDINGS: Generalized soft tissue swelling is noted in the right foot. Generalized osteopenia is seen. No fracture or dislocation is seen. Mild tarsal degenerative changes are noted. IMPRESSION: Generalized osteopenia. No acute fracture or erosive lesions are seen. Electronically Signed   By: Inez Catalina M.D.   On: 02/03/2019 12:30   - pertinent xrays, CT, MRI studies were reviewed and independently interpreted  Positive ROS: All other systems have been reviewed and were otherwise negative with the exception of those mentioned in the HPI and as above.  Physical Exam: General: Alert, no acute distress Psychiatric: Patient is competent for consent with normal mood and affect Lymphatic: No axillary or cervical lymphadenopathy Cardiovascular: No pedal edema Respiratory:  No cyanosis, no use of accessory musculature GI: No organomegaly, abdomen is soft and non-tender    Images:  @ENCIMAGES @  Labs:  Lab Results  Component Value Date   HGBA1C 6.0 (H) 09/08/2018   HGBA1C 6.4 07/15/2018   HGBA1C 6.1 (H) 05/27/2018   ESRSEDRATE 42 (H) 08/12/2018   ESRSEDRATE 56 (H) 03/09/2018   ESRSEDRATE 13 06/02/2016   CRP 1.6 (H) 02/04/2019   CRP 1.5 (H) 12/10/2018   CRP 2.1 (H) 12/09/2018   LABURIC 10.5 (H) 09/13/2018   LABURIC 11.0 (H) 09/11/2018   LABURIC 12.5 (H) 09/08/2018   REPTSTATUS PENDING  02/02/2019   REPTSTATUS PENDING 02/02/2019   GRAMSTAIN  05/27/2018    FEW WBC PRESENT, PREDOMINANTLY PMN MODERATE GRAM POSITIVE RODS RARE GRAM POSITIVE COCCI IN PAIRS    CULT  02/02/2019    NO GROWTH 2 DAYS Performed at [facility], [place] [address] [place],  [address]    CULT  02/02/2019    NO GROWTH 2 DAYS Performed at [facility] [address], [place], [place] [address]    LABORGA KLEBSIELLA PNEUMONIAE (A) 11/09/2018    Lab Results  Component Value Date   ALBUMIN 3.0 (L) 12/10/2018   ALBUMIN 3.1 (L) 12/09/2018   ALBUMIN 3.1 (L) 12/08/2018   LABURIC 10.5 (H) 09/13/2018   LABURIC 11.0 (H) 09/11/2018   LABURIC 12.5 (H) 09/08/2018    Neurologic: Patient does not have protective sensation bilateral lower extremities.   MUSCULOSKELETAL:   Skin: Examination the posterior thigh ulcers are extremely painful to palpation however they are superficial there is no cellulitis no drainage no full-thickness skin loss.  Examination of the right leg the split-thickness skin graft is healing quite well the wound is approximately 3 x 1 cm with good granulation tissue.  Examination of both heels patient has a large superficial blister on the posterior lateral aspect of her left heel which is 5 cm in diameter the skin is intact this appears to be a blood blister.  Right foot also has preulcerative blister without full-thickness skin loss without blistering.  Patient's both feet and both thighs are extremely painful to touch.  Assessment: Assessment: New ulcerations bilateral feet with ulcerations posterior aspect of bilateral thighs with a well-healing skin graft to the right leg.  Plan: Plan: I would continue the IV antibiotics and discharged on oral antibiotics.  She will need to be nonweightbearing on both feet most likely will need discharge to a skilled nursing facility.  Patient has a history of uncontrolled gout and I will order a uric acid.  Some of her pain may  be coming from elevated uric acid.  Thank you for the consult and the opportunity to see Ms. Princella Ion, MD [place] [phone] 11:08 AM

## 2019-02-04 NOTE — Consult Note (Signed)
[facility] nursing team consulted, however Dr. [name] has seen patient for same.  Will not consult for that reason.   Re consult if needed, will not follow at this time. Thanks  Yuka Lallier [employer], RN,CWOCN, CNS, [place] [phone])

## 2019-02-04 NOTE — Plan of Care (Signed)

## 2019-02-04 NOTE — Progress Notes (Signed)
Patient ID: April [name], female   DOB: 06-29-1950, 69 y.o.   MRN: [id number]  PROGRESS NOTE    April [name]  BSW:[id number] DOB: [birth year] DOA: 02/02/2019 PCP: [name], MD   Brief Narrative:  69 year old female with history of hypertension, hyperlipidemia, diabetes mellitus type 2, neuropathy, chronic kidney disease stage III, CAD, asthma, morbid obesity, recent Covid infection in November requiring hospitalization and treatment with steroids/remdesivir, chronic right shin wound, chronic ambulatory dysfunction presented with worsening weakness along with new pain and rash around the right shin ulcer area along with painful blister on the bottom of the left foot.  She was started on antibiotics for cellulitis.  Assessment & Plan:   Cellulitis around right thigh/left shin chronic wound: Present on admission Left foot blister with possible cellulitis: Present on admission -Currently on Rocephin and vancomycin -Wound care consult.  X-rays of the left and right foot as well as right tibia-fibula x-ray showed no acute lesions except for soft tissue swelling of the left foot.  -Have asked Dr. [name] to evaluate her as well.  Follow recommendations.  Acute on chronic ambulatory dysfunction -PT recommends SNF placement.  Social worker consulted  Acute kidney injury on chronic kidney disease stage III -Baseline creatinine fluctuating between 1.2-1.5.  Creatinine 1.35 today and improving.  Spironolactone and Lasix on hold.  Chronic neuropathy -Continue renally dosed gabapentin  Hypertension -Blood pressure intermittently on the lower side.  Continue low-dose Coreg.  Spironolactone and Lasix on hold  Asthma/COPD -Currently stable with no signs of exacerbation  Morbid obesity -Outpatient follow-up   DVT prophylaxis: Heparin subcutaneously Code Status: Full Family Communication: Spoke to patient at bedside  disposition Plan: SNF once bed is available  Consultants:  Orthopedic/Dr. [name]  Procedures: None  Antimicrobials:  Rocephin from 02/02/2019 onwards Admission from 02/02/2018 onwards  Subjective: Patient seen and examined at bedside.  Still feels weak and tired and complains of left foot and right leg pain.  No overnight fever or vomiting. Objective: Vitals:   02/03/19 1710 02/03/19 2011 02/04/19 0403 02/04/19 0759  BP: (!) 107/55 (!) 122/51 131/64 (!) 105/57  Pulse: 81 75 77 83  Resp:  18 18 17   Temp:  99.1 F (37.3 C) 98.9 F (37.2 C) 98.4 F (36.9 C)  TempSrc:  Oral  Oral  SpO2:  95% 95% 99%  Weight:      Height:        Intake/Output Summary (Last 24 hours) at 02/04/2019 0825 Last data filed at 02/04/2019 0500 Gross per 24 hour  Intake 500 ml  Output 1200 ml  Net -700 ml   Filed Weights   02/03/19 1436  Weight: (!) 161.3 kg    Examination:  General exam: No distress. Looks chronically ill. Respiratory system: Bilateral decreased breath sounds at bases with some crackles.  No wheezing  cardiovascular system: Rate controlled, S1-S2 heard Gastrointestinal system: Abdomen is morbidly obese, nondistended, soft and nontender. Normal bowel sounds heard. Extremities: No cyanosis; bilateral lower extremity edema with skin changes.  Left foot plantar aspect has a blister with extreme tenderness.  Right shin area dressing present  Data Reviewed: I have personally reviewed following labs and imaging studies  CBC: Recent Labs  Lab 02/02/19 1654 02/03/19 0329 02/04/19 0412  WBC 5.1 4.5 4.6  NEUTROABS 3.6  --  2.4  HGB 11.2* 10.1* 9.9*  HCT 36.3 31.6* 31.1*  MCV 95.5 92.9 94.2  PLT 235 202 466   Basic Metabolic Panel: Recent Labs  Lab 02/02/19 1654  02/03/19 0329 02/04/19 0412  NA 138 138 141  K 4.2 3.9 4.0  CL 98 100 104  CO2 30 28 28   GLUCOSE 106* 95 120*  BUN 45* 42* 31*  CREATININE 1.89* 1.60* 1.35*  CALCIUM 9.3 8.7* 8.4*  MG  --   --  2.3   GFR: Estimated Creatinine Clearance: 59.6 mL/min (A) (by C-G formula  based on SCr of 1.35 mg/dL (H)). Liver Function Tests: No results for input(s): AST, ALT, ALKPHOS, BILITOT, PROT, ALBUMIN in the last 168 hours. No results for input(s): LIPASE, AMYLASE in the last 168 hours. No results for input(s): AMMONIA in the last 168 hours. Coagulation Profile: No results for input(s): INR, PROTIME in the last 168 hours. Cardiac Enzymes: No results for input(s): CKTOTAL, CKMB, CKMBINDEX, TROPONINI in the last 168 hours. BNP (last 3 results) No results for input(s): PROBNP in the last 8760 hours. HbA1C: No results for input(s): HGBA1C in the last 72 hours. CBG: Recent Labs  Lab 02/03/19 0633 02/03/19 1136 02/03/19 1636 02/03/19 2047 02/04/19 0637  GLUCAP 80 97 110* 116* 103*   Lipid Profile: No results for input(s): CHOL, HDL, LDLCALC, TRIG, CHOLHDL, LDLDIRECT in the last 72 hours. Thyroid Function Tests: No results for input(s): TSH, T4TOTAL, FREET4, T3FREE, THYROIDAB in the last 72 hours. Anemia Panel: No results for input(s): VITAMINB12, FOLATE, FERRITIN, TIBC, IRON, RETICCTPCT in the last 72 hours. Sepsis Labs: Recent Labs  Lab 02/02/19 1654 02/02/19 1945  LATICACIDVEN 1.1 1.0    Recent Results (from the past 240 hour(s))  Culture, blood (routine x 2)     Status: None (Preliminary result)   Collection Time: 02/02/19  5:11 PM   Specimen: BLOOD  Result Value Ref Range Status   Specimen Description BLOOD RIGHT ANTECUBITAL  Final   Special Requests   Final    BOTTLES DRAWN AEROBIC AND ANAEROBIC Blood Culture results may not be optimal due to an excessive volume of blood received in culture bottles   Culture   Final    NO GROWTH < 24 HOURS Performed at [facility] [address], [place], [place] [address]    Report Status PENDING  Incomplete  Culture, blood (routine x 2)     Status: None (Preliminary result)   Collection Time: 02/02/19  5:11 PM   Specimen: BLOOD  Result Value Ref Range Status   Specimen Description BLOOD SITE NOT  SPECIFIED  Final   Special Requests   Final    BOTTLES DRAWN AEROBIC AND ANAEROBIC Blood Culture results may not be optimal due to an excessive volume of blood received in culture bottles   Culture   Final    NO GROWTH < 24 HOURS Performed at [facility], [place] [address] [place], [place] [address]    Report Status PENDING  Incomplete  SARS CORONAVIRUS 2 (TAT 6-24 HRS) Nasopharyngeal Nasopharyngeal Swab     Status: None   Collection Time: 02/02/19  6:45 PM   Specimen: Nasopharyngeal Swab  Result Value Ref Range Status   SARS Coronavirus 2 NEGATIVE NEGATIVE Final    Comment: (NOTE) SARS-CoV-2 target nucleic acids are NOT DETECTED. The SARS-CoV-2 RNA is generally detectable in upper and lower respiratory specimens during the acute phase of infection. Negative results do not preclude SARS-CoV-2 infection, do not rule out co-infections with other pathogens, and should not be used as the sole basis for treatment or other patient management decisions. Negative results must be combined with clinical observations, patient history, and epidemiological  information. The expected result is Negative. Fact Sheet for Patients: SugarRoll.be Fact Sheet for Healthcare Providers: https://www.woods-mathews.com/ This test is not yet approved or cleared by the [place] FDA and  has been authorized for detection and/or diagnosis of SARS-CoV-2 by FDA under an Emergency Use Authorization (EUA). This EUA will remain  in effect (meaning this test can be used) for the duration of the COVID-19 declaration under Section 56 4(b)(1) of the Act, 21 U.S.C. section 360bbb-3(b)(1), unless the authorization is terminated or revoked sooner. Performed at [facility], [place] [address] [place], [place] [address]   MRSA PCR Screening     Status: None   Collection Time: 02/03/19  6:30 AM   Specimen: Nasopharyngeal  Result Value Ref Range Status   MRSA by PCR  NEGATIVE NEGATIVE Final    Comment:        The GeneXpert MRSA Assay (FDA approved for NASAL specimens only), is one component of a comprehensive MRSA colonization surveillance program. It is not intended to diagnose MRSA infection nor to guide or monitor treatment for MRSA infections. Performed at [facility], [place] [address] [place], [place] [address]          Radiology Studies: DG Tibia/Fibula Right  Result Date: 02/03/2019 CLINICAL DATA:  Diabetic soft tissue wounds EXAM: RIGHT TIBIA AND FIBULA - 2 VIEW COMPARISON:  None. FINDINGS: Postsurgical changes are noted in the lower right leg. No acute fracture or dislocation is noted. Degenerative changes of the knee joint are seen. IMPRESSION: Degenerative changes of the right knee joint. No acute bony abnormality is seen. Soft tissue postsurgical changes are noted. Electronically Signed   By: Inez April M.D.   On: 02/03/2019 12:32   DG Foot 2 Views Left  Result Date: 02/03/2019 CLINICAL DATA:  Bilateral foot pain EXAM: LEFT FOOT - 2 VIEW COMPARISON:  None. FINDINGS: The bones appear diffusely osteopenic. There is diffuse soft tissue swelling. Small plantar and posterior calcaneal heel spurs identified. No fracture or dislocation identified. IMPRESSION: 1. No acute findings. 2. Diffuse soft tissue swelling. 3. Heel spurs. Electronically Signed   By: Kerby Moors M.D.   On: 02/03/2019 12:31   DG Foot 2 Views Right  Result Date: 02/03/2019 CLINICAL DATA:  Foot pain and soft tissue wounds EXAM: RIGHT FOOT - 2 VIEW COMPARISON:  None. FINDINGS: Generalized soft tissue swelling is noted in the right foot. Generalized osteopenia is seen. No fracture or dislocation is seen. Mild tarsal degenerative changes are noted. IMPRESSION: Generalized osteopenia. No acute fracture or erosive lesions are seen. Electronically Signed   By: Inez April M.D.   On: 02/03/2019 12:30        Scheduled Meds: . allopurinol  100 mg Oral Daily  .  aspirin EC  81 mg Oral QHS  . carvedilol  12.5 mg Oral BID WC  . colchicine  0.3 mg Oral Daily  . diclofenac sodium  2 g Topical QID  . famotidine  20 mg Oral BID  . ferrous sulfate  325 mg Oral Q breakfast  . fluticasone furoate-vilanterol  1 puff Inhalation Daily  . gabapentin  100 mg Oral TID  . heparin  5,000 Units Subcutaneous Q8H  . lactobacillus acidophilus  2 tablet Oral TID  . montelukast  10 mg Oral QHS  . Oxcarbazepine  300 mg Oral BID  . silver sulfADIAZINE  1 application Topical Daily  . simvastatin  40 mg Oral QHS   Continuous Infusions: . cefTRIAXone (ROCEPHIN)  IV  2 g (02/03/19 2139)  . [START ON 02/05/2019] vancomycin            Aline August, MD [facility] 02/04/2019, 8:25 AM

## 2019-02-04 NOTE — NC FL2 (Signed)
[place] MEDICAID FL2 LEVEL OF CARE SCREENING TOOL     IDENTIFICATION  Patient Name: April [name] Birthdate: 11/20/1950 Sex: female Admission Date (Current Location): 02/02/2019  [facility] and [place] Number:  Herbalist and Address:  [facility], [place] [address] [place], [place] [address]      Provider Number: [id number]  Attending Physician Name and Address:  Aline August, MD  Relative Name and Phone Number:  Myrth Dahan [phone]    Current Level of Care: Hospital Recommended Level of Care: [place] Prior Approval Number:    Date Approved/Denied:   PASRR Number: [id number]  Discharge Plan: SNF    Current Diagnoses: Patient Active Problem List   Diagnosis Date Noted  . Cellulitis 02/02/2019  . Pneumonia due to COVID-19 virus 12/10/2018  . COVID-19 virus infection 12/06/2018  . Chronic venous hypertension (idiopathic) with ulcer of right lower extremity (CODE) ([place]) 11/09/2018  . UTI (urinary tract infection) 11/09/2018  . Right hip pain 11/09/2018  . Achilles tendon sprain, left, initial encounter   . Osteomyelitis of ankle or foot, acute, left ([place]) 09/07/2018  . Osteomyelitis of ankle or foot, left, acute ([place]) 09/07/2018  . Acute on chronic kidney failure ([place]) 08/15/2018  . Onychomycosis 05/19/2018  . Idiopathic chronic venous hypertension of right lower extremity with ulcer and inflammation ([place]) 05/19/2018  . Traumatic wound dehiscence   . Skin lumps 01/14/2018  . Bilateral leg cramps 12/14/2017  . Left shoulder pain 08/14/2016  . Meralgia paresthetica 07/16/2016  . Chronic left-sided low back pain with left-sided sciatica 06/02/2016  . AKI (acute kidney injury) ([place]) 03/28/2016  . Whole body pain 03/20/2016  . Diabetic peripheral neuropathy ([place]) 03/20/2016  . Osteopenia 01/11/2016  . CKD (chronic kidney disease) stage 3, GFR 30-59 ml/min (H[facility]) 01/02/2016  . Hand paresthesia 07/18/2015  .  Carpal tunnel syndrome 06/07/2015  . Family history of colon cancer   . Diabetes mellitus with neurological manifestations ([place]) 04/18/2015  . Bilateral leg edema 04/11/2015  . Abnormality of gait 01/03/2015  . Hereditary and idiopathic peripheral neuropathy 01/03/2015  . GERD (gastroesophageal reflux disease) 06/17/2014  . Hx of colonic polyps 12/15/2012  . Vitamin B12 deficiency 11/03/2012  . Intrinsic asthma 03/23/2012  . Chronic diastolic heart failure ([place]) 02/20/2011  . OSA (obstructive sleep apnea) 09/17/2010  . URINARY URGENCY 01/08/2010  . CAD, NATIVE VESSEL 11/20/2008  . Osteoarthritis of knees, bilateral 06/14/2008  . Hyperlipidemia 05/10/2007  . Essential hypertension 01/18/2007  . Hypokalemia 04/30/2006  . Morbid (severe) obesity due to excess calories ([place]) 04/30/2006  . HX, PERSONAL, PEPTIC ULCER DISEASE 04/30/2006    Orientation RESPIRATION BLADDER Height & Weight     Self, Time, Situation, Place  Normal Continent, External catheter Weight: (!) 355 lb 9.6 oz (161.3 kg) Height:  5\' 2"  (157.5 cm)  BEHAVIORAL SYMPTOMS/MOOD NEUROLOGICAL BOWEL NUTRITION STATUS      Continent Diet(see discharge summary)  AMBULATORY STATUS COMMUNICATION OF NEEDS Skin   Extensive Assist Verbally Surgical wounds, Bruising, Other (Comment)(R shin wound; L foot wound)                       Personal Care Assistance Level of Assistance  Bathing, Feeding, Dressing Bathing Assistance: Maximum assistance Feeding assistance: Maximum assistance Dressing Assistance: Maximum assistance     Functional Limitations Info             SPECIAL CARE FACTORS FREQUENCY  PT (By licensed PT), OT (By licensed  OT)     PT Frequency: 5 times a week OT Frequency: 5 times a week            Contractures      Additional Factors Info  Code Status Code Status Info: Full             Current Medications (02/04/2019):  This is the current hospital active medication list Current  Facility-Administered Medications  Medication Dose Route Frequency Provider Last Rate Last Admin  . acetaminophen (TYLENOL) tablet 650 mg  650 mg Oral Q6H PRN Lequita Halt, MD   650 mg at 02/04/19 0925  . allopurinol (ZYLOPRIM) tablet 100 mg  100 mg Oral Daily Wynetta Fines T, MD   100 mg at 02/04/19 0910  . aspirin EC tablet 81 mg  81 mg Oral QHS Lequita Halt, MD   81 mg at 02/03/19 2132  . carvedilol (COREG) tablet 12.5 mg  12.5 mg Oral BID WC Wynetta Fines T, MD   12.5 mg at 02/04/19 0849  . cefTRIAXone (ROCEPHIN) 2 g in sodium chloride 0.9 % 100 mL IVPB  2 g Intravenous Q24H Wynetta Fines T, MD 200 mL/hr at 02/03/19 2139 2 g at 02/03/19 2139  . colchicine tablet 0.3 mg  0.3 mg Oral Daily Wynetta Fines T, MD   0.3 mg at 02/04/19 0910  . diclofenac sodium (VOLTAREN) 1 % transdermal gel 2 g  2 g Topical QID Wynetta Fines T, MD   2 g at 02/04/19 0913  . famotidine (PEPCID) tablet 20 mg  20 mg Oral BID Wynetta Fines T, MD   20 mg at 02/04/19 0910  . ferrous sulfate tablet 325 mg  325 mg Oral Q breakfast Wynetta Fines T, MD   325 mg at 02/04/19 0849  . fluticasone furoate-vilanterol (BREO ELLIPTA) 200-25 MCG/INH 1 puff  1 puff Inhalation Daily Lequita Halt, MD   1 puff at 02/04/19 0936  . gabapentin (NEURONTIN) capsule 100 mg  100 mg Oral TID Wynetta Fines T, MD   100 mg at 02/04/19 0920  . heparin injection 5,000 Units  5,000 Units Subcutaneous Q8H Lequita Halt, MD   5,000 Units at 02/04/19 0440  . lactobacillus acidophilus (BACID) tablet 2 tablet  2 tablet Oral TID Lequita Halt, MD   2 tablet at 02/04/19 0911  . montelukast (SINGULAIR) tablet 10 mg  10 mg Oral QHS Wynetta Fines T, MD   10 mg at 02/03/19 2134  . Oxcarbazepine (TRILEPTAL) tablet 300 mg  300 mg Oral BID Wynetta Fines T, MD   300 mg at 02/04/19 0913  . silver sulfADIAZINE (SILVADENE) 1 % cream 1 application  1 application Topical Daily Lequita Halt, MD   1 application at 46/96/29 0913  . simvastatin (ZOCOR) tablet 40 mg  40 mg Oral QHS Wynetta Fines  T, MD   40 mg at 02/03/19 2133  . traMADol (ULTRAM) tablet 50 mg  50 mg Oral Q6H PRN Wynetta Fines T, MD   50 mg at 02/04/19 0440  . [START ON 02/05/2019] vancomycin (VANCOREADY) IVPB 2000 mg/400 mL  2,000 mg Intravenous Q48H Aline August, MD         Discharge Medications: Please see discharge summary for a list of discharge medications.  Relevant Imaging Results:  Relevant Lab Results:   Additional Information [id number]  Atilano Median, LCSW

## 2019-02-05 DIAGNOSIS — I10 Essential (primary) hypertension: Secondary | ICD-10-CM

## 2019-02-05 LAB — BASIC METABOLIC PANEL
Anion gap: 8 (ref 5–15)
BUN: 20 mg/dL (ref 8–23)
CO2: 27 mmol/L (ref 22–32)
Calcium: 8.8 mg/dL — ABNORMAL LOW (ref 8.9–10.3)
Chloride: 104 mmol/L (ref 98–111)
Creatinine, Ser: 1.1 mg/dL — ABNORMAL HIGH (ref 0.44–1.00)
GFR calc Af Amer: 60 mL/min — ABNORMAL LOW (ref >60)
GFR calc non Af Amer: 52 mL/min — ABNORMAL LOW (ref >60)
Glucose, Bld: 98 mg/dL (ref 70–99)
Potassium: 4 mmol/L (ref 3.5–5.1)
Sodium: 139 mmol/L (ref 135–145)

## 2019-02-05 LAB — CBC WITH DIFFERENTIAL/PLATELET
Abs Immature Granulocytes: 0.02 10*3/uL (ref 0.00–0.07)
Basophils Absolute: 0.1 10*3/uL (ref 0.0–0.1)
Basophils Relative: 1 %
Eosinophils Absolute: 0.3 10*3/uL (ref 0.0–0.5)
Eosinophils Relative: 7 %
HCT: 33.8 % — ABNORMAL LOW (ref 36.0–46.0)
Hemoglobin: 10.1 g/dL — ABNORMAL LOW (ref 12.0–15.0)
Immature Granulocytes: 1 %
Lymphocytes Relative: 36 %
Lymphs Abs: 1.6 10*3/uL (ref 0.7–4.0)
MCH: 29 pg (ref 26.0–34.0)
MCHC: 29.9 g/dL — ABNORMAL LOW (ref 30.0–36.0)
MCV: 97.1 fL (ref 80.0–100.0)
Monocytes Absolute: 0.4 10*3/uL (ref 0.1–1.0)
Monocytes Relative: 10 %
Neutro Abs: 2 10*3/uL (ref 1.7–7.7)
Neutrophils Relative %: 45 %
Platelets: 202 10*3/uL (ref 150–400)
RBC: 3.48 MIL/uL — ABNORMAL LOW (ref 3.87–5.11)
RDW: 16.5 % — ABNORMAL HIGH (ref 11.5–15.5)
WBC: 4.4 10*3/uL (ref 4.0–10.5)
nRBC: 0 % (ref 0.0–0.2)

## 2019-02-05 LAB — MAGNESIUM: Magnesium: 2.3 mg/dL (ref 1.7–2.4)

## 2019-02-05 LAB — SARS CORONAVIRUS 2 (TAT 6-24 HRS): SARS Coronavirus 2: NEGATIVE

## 2019-02-05 MED ORDER — HYDROXYZINE HCL 25 MG PO TABS
25.0000 mg | ORAL_TABLET | Freq: Three times a day (TID) | ORAL | Status: DC | PRN
  Administered 2019-02-05 – 2019-02-28 (×11): 25 mg via ORAL
  Filled 2019-02-05 (×11): qty 1

## 2019-02-05 MED ORDER — OXYCODONE HCL 5 MG PO TABS
5.0000 mg | ORAL_TABLET | Freq: Four times a day (QID) | ORAL | Status: DC | PRN
  Administered 2019-02-05 – 2019-02-06 (×2): 5 mg via ORAL
  Filled 2019-02-05 (×2): qty 1

## 2019-02-05 MED ORDER — VANCOMYCIN HCL 1500 MG/300ML IV SOLN
1500.0000 mg | INTRAVENOUS | Status: DC
  Administered 2019-02-05 – 2019-02-06 (×2): 1500 mg via INTRAVENOUS
  Filled 2019-02-05 (×3): qty 300

## 2019-02-05 MED ORDER — FUROSEMIDE 40 MG PO TABS
80.0000 mg | ORAL_TABLET | Freq: Two times a day (BID) | ORAL | Status: DC
  Administered 2019-02-05 – 2019-02-10 (×11): 80 mg via ORAL
  Filled 2019-02-05 (×11): qty 2

## 2019-02-05 MED ORDER — HYDROCORTISONE 1 % EX CREA
TOPICAL_CREAM | Freq: Two times a day (BID) | CUTANEOUS | Status: DC
  Administered 2019-02-09 – 2019-02-20 (×4): 1 via TOPICAL
  Filled 2019-02-05 (×3): qty 28

## 2019-02-05 NOTE — Progress Notes (Signed)
Patient ID: April [name], female   DOB: [birth year], 69 y.o.   MRN: [id number]  PROGRESS NOTE    Maisha Bogen  WIO:[id number] DOB: 07-Aug-1950 DOA: 02/02/2019 PCP: [name], MD   Brief Narrative:  69 year old female with history of hypertension, hyperlipidemia, diabetes mellitus type 2, neuropathy, chronic kidney disease stage III, CAD, asthma, morbid obesity, recent Covid infection in November requiring hospitalization and treatment with steroids/remdesivir, chronic right shin wound, chronic ambulatory dysfunction presented with worsening weakness along with new pain and rash around the right shin ulcer area along with painful blister on the bottom of the left foot.  She was started on antibiotics for cellulitis.  Assessment & Plan:   Cellulitis around right thigh/left shin chronic wound: Present on admission Left foot blister with possible cellulitis: Present on admission -Currently on Rocephin and vancomycin -X-rays of the left and right foot as well as right tibia-fibula x-ray showed no acute lesions except for soft tissue swelling of the left foot.   -Orthopedic/Dr. [name] following.  Continue antibiotics.  Acute on chronic ambulatory dysfunction -PT recommends SNF placement.  Social worker consulted  Acute kidney injury on chronic kidney disease stage III -Baseline creatinine fluctuating between 1.2-1.5.  Creatinine 1.10 today and improving.  Might need to restart spironolactone and Lasix at some point.  Chronic neuropathy -Continue renally dosed gabapentin  Hypertension -Blood pressure improving.  Continue low-dose Coreg.  Spironolactone and Lasix might have to be restarted.  Asthma/COPD -Currently stable with no signs of exacerbation  Morbid obesity -Outpatient follow-up   DVT prophylaxis: Heparin subcutaneously Code Status: Full Family Communication: Spoke to patient at bedside  disposition Plan: SNF once bed is available  Consultants: Orthopedic/Dr.  [name]  Procedures: None  Antimicrobials:  Rocephin from 02/02/2019 onwards Admission from 02/02/2018 onwards  Subjective: Patient seen and examined at bedside.  Still complains of left foot pain.  No overnight fever, vomiting or worsening shortness of breath.  Complains of itching Objective: Vitals:   02/04/19 1438 02/04/19 1914 02/05/19 0354 02/05/19 0725  BP: (!) 124/58 126/60 (!) 134/56 136/60  Pulse: 70 72 66 71  Resp: 17 18 18 18   Temp: 98.6 F (37 C) 98.1 F (36.7 C) 99 F (37.2 C) 99 F (37.2 C)  TempSrc: Oral Oral Oral Oral  SpO2: 97% 95% 98% 96%  Weight:      Height:        Intake/Output Summary (Last 24 hours) at 02/05/2019 0813 Last data filed at 02/05/2019 0348 Gross per 24 hour  Intake 450 ml  Output 1001 ml  Net -551 ml   Filed Weights   02/03/19 1436  Weight: (!) 161.3 kg    Examination:  General exam: No acute distress.  Looks chronically ill. Respiratory system: Bilateral decreased breath sounds at bases with scattered crackles.   Cardiovascular system: S1-S2 heard, rate controlled Gastrointestinal system: Abdomen is morbidly obese, nondistended, soft and nontender. Normal bowel sounds heard. Extremities: No cyanosis; bilateral lower extremity edema with skin changes.  Left foot plantar aspect has a blister with extreme tenderness.  Right shin area dressing present  Data Reviewed: I have personally reviewed following labs and imaging studies  CBC: Recent Labs  Lab 02/02/19 1654 02/03/19 0329 02/04/19 0412 02/05/19 0332  WBC 5.1 4.5 4.6 4.4  NEUTROABS 3.6  --  2.4 2.0  HGB 11.2* 10.1* 9.9* 10.1*  HCT 36.3 31.6* 31.1* 33.8*  MCV 95.5 92.9 94.2 97.1  PLT 235 202 185 384   Basic Metabolic Panel: Recent  Labs  Lab 02/02/19 1654 02/03/19 0329 02/04/19 0412 02/05/19 0332  NA 138 138 141 139  K 4.2 3.9 4.0 4.0  CL 98 100 104 104  CO2 30 28 28 27   GLUCOSE 106* 95 120* 98  BUN 45* 42* 31* 20  CREATININE 1.89* 1.60* 1.35* 1.10*  CALCIUM 9.3  8.7* 8.4* 8.8*  MG  --   --  2.3 2.3   GFR: Estimated Creatinine Clearance: 73.1 mL/min (A) (by C-G formula based on SCr of 1.1 mg/dL (H)). Liver Function Tests: No results for input(s): AST, ALT, ALKPHOS, BILITOT, PROT, ALBUMIN in the last 168 hours. No results for input(s): LIPASE, AMYLASE in the last 168 hours. No results for input(s): AMMONIA in the last 168 hours. Coagulation Profile: No results for input(s): INR, PROTIME in the last 168 hours. Cardiac Enzymes: No results for input(s): CKTOTAL, CKMB, CKMBINDEX, TROPONINI in the last 168 hours. BNP (last 3 results) No results for input(s): PROBNP in the last 8760 hours. HbA1C: No results for input(s): HGBA1C in the last 72 hours. CBG: Recent Labs  Lab 02/03/19 1636 02/03/19 2047 02/04/19 0637 02/04/19 1126 02/04/19 1558  GLUCAP 110* 116* 103* 96 118*   Lipid Profile: No results for input(s): CHOL, HDL, LDLCALC, TRIG, CHOLHDL, LDLDIRECT in the last 72 hours. Thyroid Function Tests: No results for input(s): TSH, T4TOTAL, FREET4, T3FREE, THYROIDAB in the last 72 hours. Anemia Panel: No results for input(s): VITAMINB12, FOLATE, FERRITIN, TIBC, IRON, RETICCTPCT in the last 72 hours. Sepsis Labs: Recent Labs  Lab 02/02/19 1654 02/02/19 1945  LATICACIDVEN 1.1 1.0    Recent Results (from the past 240 hour(s))  Culture, blood (routine x 2)     Status: None (Preliminary result)   Collection Time: 02/02/19  5:11 PM   Specimen: BLOOD  Result Value Ref Range Status   Specimen Description BLOOD RIGHT ANTECUBITAL  Final   Special Requests   Final    BOTTLES DRAWN AEROBIC AND ANAEROBIC Blood Culture results may not be optimal due to an excessive volume of blood received in culture bottles   Culture   Final    NO GROWTH 2 DAYS Performed at [facility] [address], [place], [place] [address]    Report Status PENDING  Incomplete  Culture, blood (routine x 2)     Status: None (Preliminary result)   Collection Time:  02/02/19  5:11 PM   Specimen: BLOOD  Result Value Ref Range Status   Specimen Description BLOOD SITE NOT SPECIFIED  Final   Special Requests   Final    BOTTLES DRAWN AEROBIC AND ANAEROBIC Blood Culture results may not be optimal due to an excessive volume of blood received in culture bottles   Culture   Final    NO GROWTH 2 DAYS Performed at [facility], [place] [address] [place], [place] [address]    Report Status PENDING  Incomplete  SARS CORONAVIRUS 2 (TAT 6-24 HRS) Nasopharyngeal Nasopharyngeal Swab     Status: None   Collection Time: 02/02/19  6:45 PM   Specimen: Nasopharyngeal Swab  Result Value Ref Range Status   SARS Coronavirus 2 NEGATIVE NEGATIVE Final    Comment: (NOTE) SARS-CoV-2 target nucleic acids are NOT DETECTED. The SARS-CoV-2 RNA is generally detectable in upper and lower respiratory specimens during the acute phase of infection. Negative results do not preclude SARS-CoV-2 infection, do not rule out co-infections with other pathogens, and should not be used as the sole basis for treatment or other patient  management decisions. Negative results must be combined with clinical observations, patient history, and epidemiological information. The expected result is Negative. Fact Sheet for Patients: SugarRoll.be Fact Sheet for Healthcare Providers: https://www.woods-mathews.com/ This test is not yet approved or cleared by the [place] FDA and  has been authorized for detection and/or diagnosis of SARS-CoV-2 by FDA under an Emergency Use Authorization (EUA). This EUA will remain  in effect (meaning this test can be used) for the duration of the COVID-19 declaration under Section 56 4(b)(1) of the Act, 21 U.S.C. section 360bbb-3(b)(1), unless the authorization is terminated or revoked sooner. Performed at [facility], [place] [address] [place], [place] [address]   MRSA PCR Screening     Status: None    Collection Time: 02/03/19  6:30 AM   Specimen: Nasopharyngeal  Result Value Ref Range Status   MRSA by PCR NEGATIVE NEGATIVE Final    Comment:        The GeneXpert MRSA Assay (FDA approved for NASAL specimens only), is one component of a comprehensive MRSA colonization surveillance program. It is not intended to diagnose MRSA infection nor to guide or monitor treatment for MRSA infections. Performed at [facility], [place] [address], [place],  [address]          Radiology Studies: DG Tibia/Fibula Right  Result Date: 02/03/2019 CLINICAL DATA:  Diabetic soft tissue wounds EXAM: RIGHT TIBIA AND FIBULA - 2 VIEW COMPARISON:  None. FINDINGS: Postsurgical changes are noted in the lower right leg. No acute fracture or dislocation is noted. Degenerative changes of the knee joint are seen. IMPRESSION: Degenerative changes of the right knee joint. No acute bony abnormality is seen. Soft tissue postsurgical changes are noted. Electronically Signed   By: Inez April M.D.   On: 02/03/2019 12:32   DG Foot 2 Views Left  Result Date: 02/03/2019 CLINICAL DATA:  Bilateral foot pain EXAM: LEFT FOOT - 2 VIEW COMPARISON:  None. FINDINGS: The bones appear diffusely osteopenic. There is diffuse soft tissue swelling. Small plantar and posterior calcaneal heel spurs identified. No fracture or dislocation identified. IMPRESSION: 1. No acute findings. 2. Diffuse soft tissue swelling. 3. Heel spurs. Electronically Signed   By: Kerby Moors M.D.   On: 02/03/2019 12:31   DG Foot 2 Views Right  Result Date: 02/03/2019 CLINICAL DATA:  Foot pain and soft tissue wounds EXAM: RIGHT FOOT - 2 VIEW COMPARISON:  None. FINDINGS: Generalized soft tissue swelling is noted in the right foot. Generalized osteopenia is seen. No fracture or dislocation is seen. Mild tarsal degenerative changes are noted. IMPRESSION: Generalized osteopenia. No acute fracture or erosive lesions are seen. Electronically Signed   By: Inez April M.D.   On: 02/03/2019 12:30        Scheduled Meds: . allopurinol  100 mg Oral Daily  . aspirin EC  81 mg Oral QHS  . carvedilol  12.5 mg Oral BID WC  . colchicine  0.3 mg Oral Daily  . diclofenac sodium  2 g Topical QID  . famotidine  20 mg Oral BID  . ferrous sulfate  325 mg Oral Q breakfast  . fluticasone furoate-vilanterol  1 puff Inhalation Daily  . gabapentin  100 mg Oral TID  . heparin  5,000 Units Subcutaneous Q8H  . lactobacillus acidophilus  2 tablet Oral TID  . montelukast  10 mg Oral QHS  . Oxcarbazepine  300 mg Oral BID  . silver sulfADIAZINE  1 application Topical Daily  . simvastatin  40 mg Oral QHS   Continuous Infusions: . cefTRIAXone (ROCEPHIN)  IV 2 g (02/04/19 1958)  . vancomycin 1,250 mg (02/04/19 1414)          Aline August, MD [facility] 02/05/2019, 8:13 AM

## 2019-02-05 NOTE — Progress Notes (Signed)
Pharmacy Antibiotic Note  April [name] is a 69 y.o. female admitted on 02/02/2019 with cellulitis around R thigh/L shin chronic wound. Pharmacy has been consulted for vancomycin dosing for cellulitis. Continues on ceftriaxone.   Pt is afebrile, WBC wnl. AKI has resolved with SCr improving to 1.1.  Plan: Adjust vancomycin dose to 1500 mg IV q24h due to improved renal function Estimated AUC = 509.1, SCr used = 1.1 Continue ceftriaxone 2g q24h Monitor clinical status, renal function and cultures  Vanc levels as needed   Temp (24hrs), Avg:98.7 F (37.1 C), Min:98.1 F (36.7 C), Max:99 F (37.2 C)  Recent Labs  Lab 02/02/19 1654 02/02/19 1945 02/03/19 0329 02/04/19 0412 02/05/19 0332  WBC 5.1  --  4.5 4.6 4.4  CREATININE 1.89*  --  1.60* 1.35* 1.10*  LATICACIDVEN 1.1 1.0  --   --   --     Estimated Creatinine Clearance: 73.1 mL/min (A) (by C-G formula based on SCr of 1.1 mg/dL (H)).    Allergies  Allergen Reactions  . Penicillins Rash and Other (See Comments)    She was told not to take it anymore. Has patient had a PCN reaction causing immediate rash, facial/tongue/throat swelling, SOB or lightheadedness with hypotension: Yes Has patient had a PCN reaction causing severe rash involving mucus membranes or skin necrosis: No Has patient had a PCN reaction that required hospitalization: No Has patient had a PCN reaction occurring within the last 10 years: No If all of the above answers are "NO", then may proceed with Cephalosporin use.'  . Shellfish Allergy Anaphylaxis  . Sulfonamide Derivatives Anaphylaxis and Other (See Comments)  . Hydrochlorothiazide W-Triamterene Other (See Comments)    Hypokalemia  . Lotensin [Benazepril Hcl] Hives  . Dipyridamole Other (See Comments)    Unknown reaction  . Estrogens Other (See Comments)    Unknown reaction  . Hydrochlorothiazide Other (See Comments)    Unknown reaction  . Metronidazole Other (See Comments)    Unknown reaction   . Other Itching    PICKLES  . Spironolactone Other (See Comments)    UNSPECIFIED > "kidney problems"  . Sulfa Antibiotics Other (See Comments)    Unknown reaction  . Torsemide Other (See Comments)    Unknown reaction  . Valsartan Other (See Comments)    Unknown reaction  . Madelaine Bhat Isothiocyanate] Itching    Antimicrobials this admission: Vanc 1/1>> Ceftriaxone 12/31>> Clinda 12/31 x1  Dose adjustments this admission: 1/2: vanc 2 gm q48 h >> 1250 mg q24h 1/3: vanc 1250 mg q24h >> 1500 mg q24h  Microbiology results: 1/1 MRSA PCR: neg 12/31 covid: neg 12/31 BCx : [id number]  Thank you for allowing pharmacy to be a part of this patient's care.  Berenice Bouton, PharmD PGY1 Pharmacy Resident  Please check AMION for all [facility] phone numbers After 10:00 PM, call [place] [phone]  02/05/2019 10:20 AM

## 2019-02-06 LAB — BASIC METABOLIC PANEL
Anion gap: 8 (ref 5–15)
BUN: 17 mg/dL (ref 8–23)
CO2: 31 mmol/L (ref 22–32)
Calcium: 8.5 mg/dL — ABNORMAL LOW (ref 8.9–10.3)
Chloride: 103 mmol/L (ref 98–111)
Creatinine, Ser: 1.22 mg/dL — ABNORMAL HIGH (ref 0.44–1.00)
GFR calc Af Amer: 53 mL/min — ABNORMAL LOW (ref >60)
GFR calc non Af Amer: 45 mL/min — ABNORMAL LOW (ref >60)
Glucose, Bld: 92 mg/dL (ref 70–99)
Potassium: 4 mmol/L (ref 3.5–5.1)
Sodium: 142 mmol/L (ref 135–145)

## 2019-02-06 LAB — MAGNESIUM: Magnesium: 2.1 mg/dL (ref 1.7–2.4)

## 2019-02-06 MED ORDER — OXYCODONE HCL 5 MG PO TABS
5.0000 mg | ORAL_TABLET | Freq: Four times a day (QID) | ORAL | Status: DC | PRN
  Administered 2019-02-06 – 2019-02-13 (×12): 10 mg via ORAL
  Administered 2019-02-13: 22:00:00 5 mg via ORAL
  Administered 2019-02-14 – 2019-02-16 (×5): 10 mg via ORAL
  Administered 2019-02-17: 22:00:00 5 mg via ORAL
  Administered 2019-02-18 – 2019-02-19 (×2): 10 mg via ORAL
  Administered 2019-02-20: 02:00:00 5 mg via ORAL
  Administered 2019-02-20 – 2019-02-28 (×10): 10 mg via ORAL
  Filled 2019-02-06 (×4): qty 2
  Filled 2019-02-06: qty 1
  Filled 2019-02-06 (×5): qty 2
  Filled 2019-02-06: qty 1
  Filled 2019-02-06 (×4): qty 2
  Filled 2019-02-06: qty 1
  Filled 2019-02-06 (×18): qty 2

## 2019-02-06 MED ORDER — GUAIFENESIN 100 MG/5ML PO SOLN
5.0000 mL | ORAL | Status: DC | PRN
  Administered 2019-02-06 – 2019-02-24 (×12): 100 mg via ORAL
  Filled 2019-02-06 (×4): qty 5
  Filled 2019-02-06: qty 25
  Filled 2019-02-06 (×7): qty 5

## 2019-02-06 MED ORDER — ACETAMINOPHEN 325 MG PO TABS
650.0000 mg | ORAL_TABLET | Freq: Four times a day (QID) | ORAL | Status: DC | PRN
  Administered 2019-02-06 – 2019-02-25 (×9): 650 mg via ORAL
  Filled 2019-02-06 (×10): qty 2

## 2019-02-06 MED ORDER — DIPHENHYDRAMINE HCL 25 MG PO CAPS
25.0000 mg | ORAL_CAPSULE | Freq: Every evening | ORAL | Status: DC | PRN
  Administered 2019-02-09 – 2019-02-27 (×4): 25 mg via ORAL
  Filled 2019-02-06 (×5): qty 1

## 2019-02-06 NOTE — Plan of Care (Signed)

## 2019-02-06 NOTE — Progress Notes (Signed)
Doing ok. Said she had a 'rough" night  Heels: Left plantar blister no surrounding erythema no cellulitis or foul odor. Right small blister. Thigh wounds covered no surrounding cellulitis. Right anterior tibia. Small superficial ulcer.  No surgical Intervention planned for now

## 2019-02-06 NOTE — Progress Notes (Signed)
Patient ID: [name], female   DOB: 16-Oct-1950, 69 y.o.   MRN: [id number]  PROGRESS NOTE    Leiana Rund  HWE:[id number] DOB: [birth year] DOA: 02/02/2019 PCP: [name], MD   Brief Narrative:  69 year old female with history of hypertension, hyperlipidemia, diabetes mellitus type 2, neuropathy, chronic kidney disease stage III, CAD, asthma, morbid obesity, recent Covid infection in November requiring hospitalization and treatment with steroids/remdesivir, chronic right shin wound, chronic ambulatory dysfunction presented with worsening weakness along with new pain and rash around the right shin ulcer area along with painful blister on the bottom of the left foot.  She was started on antibiotics for cellulitis.  Assessment & Plan:   Cellulitis around right shin chronic wound: Present on admission Left foot blister with possible cellulitis: Present on admission -Currently on Rocephin and vancomycin -X-rays of the left and right foot as well as right tibia-fibula x-ray showed no acute lesions except for soft tissue swelling of the left foot.   -Orthopedic/Dr. [name] following.  Orthopedics recommending nonweightbearing for both extremities.  Continue antibiotics.  Acute on chronic ambulatory dysfunction -PT recommends SNF placement.  Social worker consulted  Acute kidney injury on chronic kidney disease stage III -Baseline creatinine fluctuating between 1.2-1.5.  Creatinine 1.22 today and improving.  Lasix was restarted on 02/05/2019.  Spironolactone still on hold.  Chronic neuropathy -Continue renally dosed gabapentin  Hypertension -Blood pressure improving.  Continue low-dose Coreg.  Continue Lasix.  Spironolactone on hold.  Asthma/COPD -Currently stable with no signs of exacerbation  Morbid obesity -Outpatient follow-up   DVT prophylaxis: Heparin subcutaneously Code Status: Full Family Communication: Spoke to patient at bedside  disposition Plan: SNF once bed is  available  Consultants: Orthopedic/Dr. [name]  Procedures: None  Antimicrobials:  Rocephin from 02/02/2019 onwards Admission from 02/02/2018 onwards  Subjective: Patient seen and examined at bedside.  Still complains of intermittent severe left leg pain.  States that she had a rough night and could not sleep and was cold.  No worsening shortness of breath, vomiting or fevers.   Objective: Vitals:   02/05/19 1730 02/05/19 1958 02/06/19 0321 02/06/19 0757  BP:  (!) 107/57 (!) 125/52   Pulse:  70 67 68  Resp:  18 18 18   Temp:  98.9 F (37.2 C) 98 F (36.7 C)   TempSrc:  Oral Oral   SpO2:  94% 96% 96%  Weight: (!) 157 kg     Height:        Intake/Output Summary (Last 24 hours) at 02/06/2019 0805 Last data filed at 02/06/2019 0100 Gross per 24 hour  Intake 719.17 ml  Output 3800 ml  Net -3080.83 ml   Filed Weights   02/03/19 1436 02/05/19 1730  Weight: (!) 161.3 kg (!) 157 kg    Examination:  General exam: No distress.  Looks chronically ill. Respiratory system: Bilateral decreased breath sounds at bases with some crackles.  No wheezing  cardiovascular system: Rate controlled, S1-S2 heard Gastrointestinal system: Abdomen is morbidly obese, nondistended, soft and nontender. Normal bowel sounds heard. Extremities: No cyanosis; bilateral lower extremity edema with skin changes.  Left foot plantar aspect has a blister with  tenderness.  Right shin area dressing present  Data Reviewed: I have personally reviewed following labs and imaging studies  CBC: Recent Labs  Lab 02/02/19 1654 02/03/19 0329 02/04/19 0412 02/05/19 0332  WBC 5.1 4.5 4.6 4.4  NEUTROABS 3.6  --  2.4 2.0  HGB 11.2* 10.1* 9.9* 10.1*  HCT 36.3 31.6*  31.1* 33.8*  MCV 95.5 92.9 94.2 97.1  PLT 235 202 185 175   Basic Metabolic Panel: Recent Labs  Lab 02/02/19 1654 02/03/19 0329 02/04/19 0412 02/05/19 0332 02/06/19 0300  NA 138 138 141 139 142  K 4.2 3.9 4.0 4.0 4.0  CL 98 100 104 104 103  CO2 30 28  28 27 31   GLUCOSE 106* 95 120* 98 92  BUN 45* 42* 31* 20 17  CREATININE 1.89* 1.60* 1.35* 1.10* 1.22*  CALCIUM 9.3 8.7* 8.4* 8.8* 8.5*  MG  --   --  2.3 2.3 2.1   GFR: Estimated Creatinine Clearance: 64.7 mL/min (A) (by C-G formula based on SCr of 1.22 mg/dL (H)). Liver Function Tests: No results for input(s): AST, ALT, ALKPHOS, BILITOT, PROT, ALBUMIN in the last 168 hours. No results for input(s): LIPASE, AMYLASE in the last 168 hours. No results for input(s): AMMONIA in the last 168 hours. Coagulation Profile: No results for input(s): INR, PROTIME in the last 168 hours. Cardiac Enzymes: No results for input(s): CKTOTAL, CKMB, CKMBINDEX, TROPONINI in the last 168 hours. BNP (last 3 results) No results for input(s): PROBNP in the last 8760 hours. HbA1C: No results for input(s): HGBA1C in the last 72 hours. CBG: Recent Labs  Lab 02/03/19 1636 02/03/19 2047 02/04/19 0637 02/04/19 1126 02/04/19 1558  GLUCAP 110* 116* 103* 96 118*   Lipid Profile: No results for input(s): CHOL, HDL, LDLCALC, TRIG, CHOLHDL, LDLDIRECT in the last 72 hours. Thyroid Function Tests: No results for input(s): TSH, T4TOTAL, FREET4, T3FREE, THYROIDAB in the last 72 hours. Anemia Panel: No results for input(s): VITAMINB12, FOLATE, FERRITIN, TIBC, IRON, RETICCTPCT in the last 72 hours. Sepsis Labs: Recent Labs  Lab 02/02/19 1654 02/02/19 1945  LATICACIDVEN 1.1 1.0    Recent Results (from the past 240 hour(s))  Culture, blood (routine x 2)     Status: None (Preliminary result)   Collection Time: 02/02/19  5:11 PM   Specimen: BLOOD  Result Value Ref Range Status   Specimen Description BLOOD RIGHT ANTECUBITAL  Final   Special Requests   Final    BOTTLES DRAWN AEROBIC AND ANAEROBIC Blood Culture results may not be optimal due to an excessive volume of blood received in culture bottles   Culture   Final    NO GROWTH 3 DAYS Performed at [facility], [place] [address] [place], [place]  [address]    Report Status PENDING  Incomplete  Culture, blood (routine x 2)     Status: None (Preliminary result)   Collection Time: 02/02/19  5:11 PM   Specimen: BLOOD  Result Value Ref Range Status   Specimen Description BLOOD SITE NOT SPECIFIED  Final   Special Requests   Final    BOTTLES DRAWN AEROBIC AND ANAEROBIC Blood Culture results may not be optimal due to an excessive volume of blood received in culture bottles   Culture   Final    NO GROWTH 3 DAYS Performed at [facility], [place] [address], [place], [place] [address]    Report Status PENDING  Incomplete  SARS CORONAVIRUS 2 (TAT 6-24 HRS) Nasopharyngeal Nasopharyngeal Swab     Status: None   Collection Time: 02/02/19  6:45 PM   Specimen: Nasopharyngeal Swab  Result Value Ref Range Status   SARS Coronavirus 2 NEGATIVE NEGATIVE Final    Comment: (NOTE) SARS-CoV-2 target nucleic acids are NOT DETECTED. The SARS-CoV-2 RNA is generally detectable in upper and lower respiratory specimens during the acute phase  of infection. Negative results do not preclude SARS-CoV-2 infection, do not rule out co-infections with other pathogens, and should not be used as the sole basis for treatment or other patient management decisions. Negative results must be combined with clinical observations, patient history, and epidemiological information. The expected result is Negative. Fact Sheet for Patients: SugarRoll.be Fact Sheet for Healthcare Providers: https://www.woods-mathews.com/ This test is not yet approved or cleared by the [place] FDA and  has been authorized for detection and/or diagnosis of SARS-CoV-2 by FDA under an Emergency Use Authorization (EUA). This EUA will remain  in effect (meaning this test can be used) for the duration of the COVID-19 declaration under Section 56 4(b)(1) of the Act, 21 U.S.C. section 360bbb-3(b)(1), unless the authorization is terminated or revoked  sooner. Performed at [facility], [place] [address] [place], [place] [address]   MRSA PCR Screening     Status: None   Collection Time: 02/03/19  6:30 AM   Specimen: Nasopharyngeal  Result Value Ref Range Status   MRSA by PCR NEGATIVE NEGATIVE Final    Comment:        The GeneXpert MRSA Assay (FDA approved for NASAL specimens only), is one component of a comprehensive MRSA colonization surveillance program. It is not intended to diagnose MRSA infection nor to guide or monitor treatment for MRSA infections. Performed at [facility], [place] [address], [place], [place] [address]   SARS CORONAVIRUS 2 (TAT 6-24 HRS) Nasopharyngeal Nasopharyngeal Swab     Status: None   Collection Time: 02/05/19  9:52 AM   Specimen: Nasopharyngeal Swab  Result Value Ref Range Status   SARS Coronavirus 2 NEGATIVE NEGATIVE Final    Comment: (NOTE) SARS-CoV-2 target nucleic acids are NOT DETECTED. The SARS-CoV-2 RNA is generally detectable in upper and lower respiratory specimens during the acute phase of infection. Negative results do not preclude SARS-CoV-2 infection, do not rule out co-infections with other pathogens, and should not be used as the sole basis for treatment or other patient management decisions. Negative results must be combined with clinical observations, patient history, and epidemiological information. The expected result is Negative. Fact Sheet for Patients: SugarRoll.be Fact Sheet for Healthcare Providers: https://www.woods-mathews.com/ This test is not yet approved or cleared by the [place] FDA and  has been authorized for detection and/or diagnosis of SARS-CoV-2 by FDA under an Emergency Use Authorization (EUA). This EUA will remain  in effect (meaning this test can be used) for the duration of the COVID-19 declaration under Section 56 4(b)(1) of the Act, 21 U.S.C. section 360bbb-3(b)(1), unless the authorization is  terminated or revoked sooner. Performed at [facility], [place] [address] [place], [place] [address]          Radiology Studies: No results found.      Scheduled Meds: . allopurinol  100 mg Oral Daily  . aspirin EC  81 mg Oral QHS  . carvedilol  12.5 mg Oral BID WC  . colchicine  0.3 mg Oral Daily  . diclofenac sodium  2 g Topical QID  . famotidine  20 mg Oral BID  . ferrous sulfate  325 mg Oral Q breakfast  . fluticasone furoate-vilanterol  1 puff Inhalation Daily  . furosemide  80 mg Oral BID  . gabapentin  100 mg Oral TID  . heparin  5,000 Units Subcutaneous Q8H  . hydrocortisone cream   Topical BID  . lactobacillus acidophilus  2 tablet Oral TID  . montelukast  10  mg Oral QHS  . Oxcarbazepine  300 mg Oral BID  . silver sulfADIAZINE  1 application Topical Daily  . simvastatin  40 mg Oral QHS   Continuous Infusions: . cefTRIAXone (ROCEPHIN)  IV 2 g (02/05/19 2008)  . vancomycin 1,500 mg (02/05/19 1327)          Aline August, MD [facility] 02/06/2019, 8:05 AM

## 2019-02-06 NOTE — Care Management Important Message (Signed)
Important Message  Patient Details  Name: April [name] MRN: [id number] Date of Birth: 1950/05/04   Medicare Important Message Given:  Yes     Memory Argue 02/06/2019, 3:21 PM   PATIENT  HAS SIGNED IM

## 2019-02-07 DIAGNOSIS — Z6841 Body Mass Index (BMI) 40.0 and over, adult: Secondary | ICD-10-CM

## 2019-02-07 DIAGNOSIS — M6281 Muscle weakness (generalized): Secondary | ICD-10-CM | POA: Diagnosis not present

## 2019-02-07 DIAGNOSIS — I2510 Atherosclerotic heart disease of native coronary artery without angina pectoris: Secondary | ICD-10-CM | POA: Diagnosis not present

## 2019-02-07 DIAGNOSIS — L97912 Non-pressure chronic ulcer of unspecified part of right lower leg with fat layer exposed: Secondary | ICD-10-CM | POA: Diagnosis not present

## 2019-02-07 DIAGNOSIS — J45909 Unspecified asthma, uncomplicated: Secondary | ICD-10-CM | POA: Diagnosis not present

## 2019-02-07 DIAGNOSIS — R262 Difficulty in walking, not elsewhere classified: Secondary | ICD-10-CM

## 2019-02-07 DIAGNOSIS — D519 Vitamin B12 deficiency anemia, unspecified: Secondary | ICD-10-CM | POA: Diagnosis not present

## 2019-02-07 DIAGNOSIS — E1140 Type 2 diabetes mellitus with diabetic neuropathy, unspecified: Secondary | ICD-10-CM

## 2019-02-07 DIAGNOSIS — Z8619 Personal history of other infectious and parasitic diseases: Secondary | ICD-10-CM

## 2019-02-07 DIAGNOSIS — I87311 Chronic venous hypertension (idiopathic) with ulcer of right lower extremity: Secondary | ICD-10-CM | POA: Diagnosis not present

## 2019-02-07 DIAGNOSIS — E1159 Type 2 diabetes mellitus with other circulatory complications: Secondary | ICD-10-CM

## 2019-02-07 DIAGNOSIS — I5032 Chronic diastolic (congestive) heart failure: Secondary | ICD-10-CM | POA: Diagnosis not present

## 2019-02-07 DIAGNOSIS — I130 Hypertensive heart and chronic kidney disease with heart failure and stage 1 through stage 4 chronic kidney disease, or unspecified chronic kidney disease: Secondary | ICD-10-CM | POA: Diagnosis not present

## 2019-02-07 DIAGNOSIS — E785 Hyperlipidemia, unspecified: Secondary | ICD-10-CM

## 2019-02-07 DIAGNOSIS — E1122 Type 2 diabetes mellitus with diabetic chronic kidney disease: Secondary | ICD-10-CM | POA: Diagnosis not present

## 2019-02-07 LAB — CULTURE, BLOOD (ROUTINE X 2)
Culture: NO GROWTH
Culture: NO GROWTH

## 2019-02-07 LAB — BASIC METABOLIC PANEL
Anion gap: 8 (ref 5–15)
BUN: 18 mg/dL (ref 8–23)
CO2: 30 mmol/L (ref 22–32)
Calcium: 8.4 mg/dL — ABNORMAL LOW (ref 8.9–10.3)
Chloride: 101 mmol/L (ref 98–111)
Creatinine, Ser: 1.19 mg/dL — ABNORMAL HIGH (ref 0.44–1.00)
GFR calc Af Amer: 54 mL/min — ABNORMAL LOW (ref >60)
GFR calc non Af Amer: 47 mL/min — ABNORMAL LOW (ref >60)
Glucose, Bld: 95 mg/dL (ref 70–99)
Potassium: 3.9 mmol/L (ref 3.5–5.1)
Sodium: 139 mmol/L (ref 135–145)

## 2019-02-07 LAB — MAGNESIUM: Magnesium: 2 mg/dL (ref 1.7–2.4)

## 2019-02-07 LAB — GLUCOSE, CAPILLARY: Glucose-Capillary: 102 mg/dL — ABNORMAL HIGH (ref 70–99)

## 2019-02-07 MED ORDER — VANCOMYCIN HCL 1250 MG/250ML IV SOLN
1250.0000 mg | INTRAVENOUS | Status: DC
  Administered 2019-02-07 – 2019-02-08 (×2): 1250 mg via INTRAVENOUS
  Filled 2019-02-07 (×3): qty 250

## 2019-02-07 NOTE — Progress Notes (Signed)
Patient ID: April [name], female   DOB: 1950/08/20, 69 y.o.   MRN: [id number]  PROGRESS NOTE    April [name]  YWV:[id number] DOB: [birth year] DOA: 02/02/2019 PCP: [name], MD   Brief Narrative:  69 year old female with history of hypertension, hyperlipidemia, diabetes mellitus type 2, neuropathy, chronic kidney disease stage III, CAD, asthma, morbid obesity, recent Covid infection in November requiring hospitalization and treatment with steroids/remdesivir, chronic right shin wound, chronic ambulatory dysfunction presented with worsening weakness along with new pain and rash around the right shin ulcer area along with painful blister on the bottom of the left foot.  She was started on antibiotics for cellulitis.  Assessment & Plan:   Cellulitis around right shin chronic wound: Present on admission Left foot blister with possible cellulitis: Present on admission -Currently on Rocephin and vancomycin.  We will switch to oral antibiotics once ready to go to SNF. -X-rays of the left and right foot as well as right tibia-fibula x-ray showed no acute lesions except for soft tissue swelling of the left foot.   -Orthopedic/Dr. [name] following.  Orthopedics recommending nonweightbearing for both extremities.  Continue antibiotics.  Acute on chronic ambulatory dysfunction Generalized deconditioning -PT recommends SNF placement.  Social worker consulted  Acute kidney injury on chronic kidney disease stage III -Baseline creatinine fluctuating between 1.2-1.5.  Creatinine 1.19 today and improving.  Lasix was restarted on 02/05/2019 at a lower dose of 80 mg twice a day.  Patient takes 120 mg twice a day of Lasix at home.  Dose of Lasix can be further increased if creatinine remains stable.  Spironolactone still on hold.  Chronic neuropathy -Continue renally dosed gabapentin  Hypertension -Blood pressure improving.  Continue low-dose Coreg.  Continue Lasix.  Spironolactone on  hold.  Asthma/COPD -Currently stable with no signs of exacerbation  Morbid obesity -Outpatient follow-up  DVT prophylaxis: Heparin subcutaneously Code Status: Full Family Communication: Spoke to patient at bedside  disposition Plan: SNF once bed is available  Consultants: Orthopedic/Dr. [name]  Procedures: None  Antimicrobials:  Rocephin from 02/02/2019 onwards Admission from 02/02/2018 onwards  Subjective: Patient seen and examined at bedside.  Still complains of intermittent left leg pain.  Could not sleep well last night as well.  No overnight fever, worsening shortness of breath. Objective: Vitals:   02/06/19 2011 02/07/19 0350 02/07/19 0830 02/07/19 0839  BP: (!) 112/51 (!) 123/55 (!) 126/59   Pulse: 70 68 (!) 59   Resp: 19 17 18 18   Temp: 98.9 F (37.2 C) 99 F (37.2 C) 98.7 F (37.1 C)   TempSrc: Oral Oral Oral   SpO2: 100% 95% 94%   Weight:      Height:        Intake/Output Summary (Last 24 hours) at 02/07/2019 1013 Last data filed at 02/07/2019 0300 Gross per 24 hour  Intake 240 ml  Output 4250 ml  Net -4010 ml   Filed Weights   02/03/19 1436 02/05/19 1730  Weight: (!) 161.3 kg (!) 157 kg    Examination:  General exam: No acute distress.  Looks chronically ill. Respiratory system: Bilateral decreased breath sounds at bases with scattered crackles.   Cardiovascular system: Intermittent bradycardia, S1-S2 heard Gastrointestinal system: Abdomen is morbidly obese, nondistended, soft and nontender.  Bowel sounds heard Extremities: No cyanosis; bilateral lower extremity edema with skin changes.  Left foot plantar aspect has a blister with  tenderness.  Right shin area dressing present  Data Reviewed: I have personally reviewed following labs and  imaging studies  CBC: Recent Labs  Lab 02/02/19 1654 02/03/19 0329 02/04/19 0412 02/05/19 0332  WBC 5.1 4.5 4.6 4.4  NEUTROABS 3.6  --  2.4 2.0  HGB 11.2* 10.1* 9.9* 10.1*  HCT 36.3 31.6* 31.1* 33.8*  MCV  95.5 92.9 94.2 97.1  PLT 235 202 185 643   Basic Metabolic Panel: Recent Labs  Lab 02/03/19 0329 02/04/19 0412 02/05/19 0332 02/06/19 0300 02/07/19 0644  NA 138 141 139 142 139  K 3.9 4.0 4.0 4.0 3.9  CL 100 104 104 103 101  CO2 28 28 27 31 30   GLUCOSE 95 120* 98 92 95  BUN 42* 31* 20 17 18   CREATININE 1.60* 1.35* 1.10* 1.22* 1.19*  CALCIUM 8.7* 8.4* 8.8* 8.5* 8.4*  MG  --  2.3 2.3 2.1 2.0   GFR: Estimated Creatinine Clearance: 66.4 mL/min (A) (by C-G formula based on SCr of 1.19 mg/dL (H)). Liver Function Tests: No results for input(s): AST, ALT, ALKPHOS, BILITOT, PROT, ALBUMIN in the last 168 hours. No results for input(s): LIPASE, AMYLASE in the last 168 hours. No results for input(s): AMMONIA in the last 168 hours. Coagulation Profile: No results for input(s): INR, PROTIME in the last 168 hours. Cardiac Enzymes: No results for input(s): CKTOTAL, CKMB, CKMBINDEX, TROPONINI in the last 168 hours. BNP (last 3 results) No results for input(s): PROBNP in the last 8760 hours. HbA1C: No results for input(s): HGBA1C in the last 72 hours. CBG: Recent Labs  Lab 02/03/19 1636 02/03/19 2047 02/04/19 0637 02/04/19 1126 02/04/19 1558  GLUCAP 110* 116* 103* 96 118*   Lipid Profile: No results for input(s): CHOL, HDL, LDLCALC, TRIG, CHOLHDL, LDLDIRECT in the last 72 hours. Thyroid Function Tests: No results for input(s): TSH, T4TOTAL, FREET4, T3FREE, THYROIDAB in the last 72 hours. Anemia Panel: No results for input(s): VITAMINB12, FOLATE, FERRITIN, TIBC, IRON, RETICCTPCT in the last 72 hours. Sepsis Labs: Recent Labs  Lab 02/02/19 1654 02/02/19 1945  LATICACIDVEN 1.1 1.0    Recent Results (from the past 240 hour(s))  Culture, blood (routine x 2)     Status: None (Preliminary result)   Collection Time: 02/02/19  5:11 PM   Specimen: BLOOD  Result Value Ref Range Status   Specimen Description BLOOD RIGHT ANTECUBITAL  Final   Special Requests   Final    BOTTLES  DRAWN AEROBIC AND ANAEROBIC Blood Culture results may not be optimal due to an excessive volume of blood received in culture bottles   Culture   Final    NO GROWTH 4 DAYS Performed at [facility] [address] [place], [place] [address]    Report Status PENDING  Incomplete  Culture, blood (routine x 2)     Status: None (Preliminary result)   Collection Time: 02/02/19  5:11 PM   Specimen: BLOOD  Result Value Ref Range Status   Specimen Description BLOOD SITE NOT SPECIFIED  Final   Special Requests   Final    BOTTLES DRAWN AEROBIC AND ANAEROBIC Blood Culture results may not be optimal due to an excessive volume of blood received in culture bottles   Culture   Final    NO GROWTH 4 DAYS Performed at [facility], [place] [address], [place], [place] [address]    Report Status PENDING  Incomplete  SARS CORONAVIRUS 2 (TAT 6-24 HRS) Nasopharyngeal Nasopharyngeal Swab     Status: None   Collection Time: 02/02/19  6:45 PM   Specimen: Nasopharyngeal Swab  Result Value Ref  Range Status   SARS Coronavirus 2 NEGATIVE NEGATIVE Final    Comment: (NOTE) SARS-CoV-2 target nucleic acids are NOT DETECTED. The SARS-CoV-2 RNA is generally detectable in upper and lower respiratory specimens during the acute phase of infection. Negative results do not preclude SARS-CoV-2 infection, do not rule out co-infections with other pathogens, and should not be used as the sole basis for treatment or other patient management decisions. Negative results must be combined with clinical observations, patient history, and epidemiological information. The expected result is Negative. Fact Sheet for Patients: SugarRoll.be Fact Sheet for Healthcare Providers: https://www.woods-mathews.com/ This test is not yet approved or cleared by the [place] FDA and  has been authorized for detection and/or diagnosis of SARS-CoV-2 by FDA under an Emergency Use Authorization  (EUA). This EUA will remain  in effect (meaning this test can be used) for the duration of the COVID-19 declaration under Section 56 4(b)(1) of the Act, 21 U.S.C. section 360bbb-3(b)(1), unless the authorization is terminated or revoked sooner. Performed at [facility], [place] [address], [place], [place] [address]   MRSA PCR Screening     Status: None   Collection Time: 02/03/19  6:30 AM   Specimen: Nasopharyngeal  Result Value Ref Range Status   MRSA by PCR NEGATIVE NEGATIVE Final    Comment:        The GeneXpert MRSA Assay (FDA approved for NASAL specimens only), is one component of a comprehensive MRSA colonization surveillance program. It is not intended to diagnose MRSA infection nor to guide or monitor treatment for MRSA infections. Performed at [facility], [place] [address], [place], [place] [address]   SARS CORONAVIRUS 2 (TAT 6-24 HRS) Nasopharyngeal Nasopharyngeal Swab     Status: None   Collection Time: 02/05/19  9:52 AM   Specimen: Nasopharyngeal Swab  Result Value Ref Range Status   SARS Coronavirus 2 NEGATIVE NEGATIVE Final    Comment: (NOTE) SARS-CoV-2 target nucleic acids are NOT DETECTED. The SARS-CoV-2 RNA is generally detectable in upper and lower respiratory specimens during the acute phase of infection. Negative results do not preclude SARS-CoV-2 infection, do not rule out co-infections with other pathogens, and should not be used as the sole basis for treatment or other patient management decisions. Negative results must be combined with clinical observations, patient history, and epidemiological information. The expected result is Negative. Fact Sheet for Patients: SugarRoll.be Fact Sheet for Healthcare Providers: https://www.woods-mathews.com/ This test is not yet approved or cleared by the [place] FDA and  has been authorized for detection and/or diagnosis of SARS-CoV-2 by FDA under an  Emergency Use Authorization (EUA). This EUA will remain  in effect (meaning this test can be used) for the duration of the COVID-19 declaration under Section 56 4(b)(1) of the Act, 21 U.S.C. section 360bbb-3(b)(1), unless the authorization is terminated or revoked sooner. Performed at [facility], [place] [address], [place], [place] [address]          Radiology Studies: No results found.      Scheduled Meds: . allopurinol  100 mg Oral Daily  . aspirin EC  81 mg Oral QHS  . carvedilol  12.5 mg Oral BID WC  . colchicine  0.3 mg Oral Daily  . diclofenac sodium  2 g Topical QID  . famotidine  20 mg Oral BID  . ferrous sulfate  325 mg Oral Q breakfast  . fluticasone furoate-vilanterol  1 puff Inhalation Daily  . furosemide  80 mg Oral BID  .  gabapentin  100 mg Oral TID  . heparin  5,000 Units Subcutaneous Q8H  . hydrocortisone cream   Topical BID  . lactobacillus acidophilus  2 tablet Oral TID  . montelukast  10 mg Oral QHS  . Oxcarbazepine  300 mg Oral BID  . silver sulfADIAZINE  1 application Topical Daily  . simvastatin  40 mg Oral QHS   Continuous Infusions: . cefTRIAXone (ROCEPHIN)  IV 2 g (02/06/19 2143)  . vancomycin 1,500 mg (02/06/19 1352)          Aline August, MD [facility] 02/07/2019, 10:13 AM

## 2019-02-07 NOTE — Progress Notes (Signed)
PT Cancellation Note  Patient Details Name: April [name] MRN: [id number] DOB: 1950/11/18   Cancelled Treatment:    Reason Eval/Treat Not Completed: Other (comment)(Unavailable)   Roney Marion, PT  Acute Rehabilitation Services Pager [phone] Office [place] 02/07/2019, 1:48 PM

## 2019-02-07 NOTE — Plan of Care (Signed)
  Problem: Education: Goal: Knowledge of General Education information will improve Description: Including pain rating scale, medication(s)/side effects and non-pharmacologic comfort measures Outcome: Progressing   Problem: Health Behavior/Discharge Planning: Goal: Ability to manage health-related needs will improve Outcome: Progressing   Problem: Activity: Goal: Risk for activity intolerance will decrease Outcome: Progressing   Problem: Nutrition: Goal: Adequate nutrition will be maintained Outcome: Progressing   Problem: Coping: Goal: Level of anxiety will decrease Outcome: Progressing   Problem: Elimination: Goal: Will not experience complications related to bowel motility Outcome: Progressing   Problem: Safety: Goal: Ability to remain free from injury will improve Outcome: Progressing   Problem: Skin Integrity: Goal: Risk for impaired skin integrity will decrease Outcome: Progressing   

## 2019-02-07 NOTE — Progress Notes (Signed)
Pharmacy Antibiotic Note  April [name] is a 69 y.o. female admitted on 02/02/2019 with cellulitis around R thigh/L shin chronic wound. Pharmacy has been consulted for vancomycin dosing for cellulitis. Continues on ceftriaxone.   SCr 1.19, CrCl ~ 66 ml/min, with good UOP  Plan: Adjust vancomycin to 1250mg  IV every 24 hours Goal AUC 400-550. Expected AUC: 466 SCr used: 1.19 Monitor renal function, clinical progression and ability to narrow Vancomycin levels at steady state  Temp (24hrs), Avg:98.9 F (37.2 C), Min:98.7 F (37.1 C), Max:99 F (37.2 C)  Recent Labs  Lab 02/02/19 1654 02/02/19 1945 02/03/19 0329 02/04/19 0412 02/05/19 0332 02/06/19 0300 02/07/19 0644  WBC 5.1  --  4.5 4.6 4.4  --   --   CREATININE 1.89*  --  1.60* 1.35* 1.10* 1.22* 1.19*  LATICACIDVEN 1.1 1.0  --   --   --   --   --     Estimated Creatinine Clearance: 66.4 mL/min (A) (by C-G formula based on SCr of 1.19 mg/dL (H)).    Allergies  Allergen Reactions  . Penicillins Rash and Other (See Comments)    She was told not to take it anymore. Has patient had a PCN reaction causing immediate rash, facial/tongue/throat swelling, SOB or lightheadedness with hypotension: Yes Has patient had a PCN reaction causing severe rash involving mucus membranes or skin necrosis: No Has patient had a PCN reaction that required hospitalization: No Has patient had a PCN reaction occurring within the last 10 years: No If all of the above answers are "NO", then may proceed with Cephalosporin use.'  . Shellfish Allergy Anaphylaxis  . Sulfonamide Derivatives Anaphylaxis and Other (See Comments)  . Hydrochlorothiazide W-Triamterene Other (See Comments)    Hypokalemia  . Lotensin [Benazepril Hcl] Hives  . Dipyridamole Other (See Comments)    Unknown reaction  . Estrogens Other (See Comments)    Unknown reaction  . Hydrochlorothiazide Other (See Comments)    Unknown reaction  . Metronidazole Other (See Comments)   Unknown reaction  . Other Itching    PICKLES  . Spironolactone Other (See Comments)    UNSPECIFIED > "kidney problems"  . Sulfa Antibiotics Other (See Comments)    Unknown reaction  . Torsemide Other (See Comments)    Unknown reaction  . Valsartan Other (See Comments)    Unknown reaction  . Madelaine Bhat Isothiocyanate] Itching    Antimicrobials this admission: Vanc 1/1>> Ceftriaxone 12/31>> Clinda 12/31 x1  Dose adjustments this admission: 1/2: vanc 2 gm q48 h >> 1250 mg q24h 1/3: vanc 1250 mg q24h >> 1500 mg q24h  Microbiology results: 1/1 MRSA PCR: neg 12/31 covid: neg 12/31 BCx : [id number]  Bertis Ruddy, PharmD Clinical Pharmacist Please check AMION for all [place] numbers 02/07/2019 1:10 PM

## 2019-02-08 ENCOUNTER — Inpatient Hospital Stay (HOSPITAL_COMMUNITY): Payer: Medicare Other

## 2019-02-08 DIAGNOSIS — M10071 Idiopathic gout, right ankle and foot: Secondary | ICD-10-CM

## 2019-02-08 DIAGNOSIS — M109 Gout, unspecified: Secondary | ICD-10-CM

## 2019-02-08 DIAGNOSIS — N1831 Chronic kidney disease, stage 3a: Secondary | ICD-10-CM

## 2019-02-08 DIAGNOSIS — G629 Polyneuropathy, unspecified: Secondary | ICD-10-CM

## 2019-02-08 DIAGNOSIS — R0602 Shortness of breath: Secondary | ICD-10-CM

## 2019-02-08 LAB — BASIC METABOLIC PANEL
Anion gap: 11 (ref 5–15)
BUN: 20 mg/dL (ref 8–23)
CO2: 30 mmol/L (ref 22–32)
Calcium: 8.6 mg/dL — ABNORMAL LOW (ref 8.9–10.3)
Chloride: 97 mmol/L — ABNORMAL LOW (ref 98–111)
Creatinine, Ser: 1.27 mg/dL — ABNORMAL HIGH (ref 0.44–1.00)
GFR calc Af Amer: 50 mL/min — ABNORMAL LOW (ref >60)
GFR calc non Af Amer: 43 mL/min — ABNORMAL LOW (ref >60)
Glucose, Bld: 115 mg/dL — ABNORMAL HIGH (ref 70–99)
Potassium: 3.9 mmol/L (ref 3.5–5.1)
Sodium: 138 mmol/L (ref 135–145)

## 2019-02-08 LAB — CBC WITH DIFFERENTIAL/PLATELET
Abs Immature Granulocytes: 0.02 10*3/uL (ref 0.00–0.07)
Basophils Absolute: 0.1 10*3/uL (ref 0.0–0.1)
Basophils Relative: 2 %
Eosinophils Absolute: 0.4 10*3/uL (ref 0.0–0.5)
Eosinophils Relative: 7 %
HCT: 35.7 % — ABNORMAL LOW (ref 36.0–46.0)
Hemoglobin: 11.2 g/dL — ABNORMAL LOW (ref 12.0–15.0)
Immature Granulocytes: 0 %
Lymphocytes Relative: 36 %
Lymphs Abs: 1.7 10*3/uL (ref 0.7–4.0)
MCH: 30 pg (ref 26.0–34.0)
MCHC: 31.4 g/dL (ref 30.0–36.0)
MCV: 95.7 fL (ref 80.0–100.0)
Monocytes Absolute: 0.4 10*3/uL (ref 0.1–1.0)
Monocytes Relative: 9 %
Neutro Abs: 2.1 10*3/uL (ref 1.7–7.7)
Neutrophils Relative %: 46 %
Platelets: 206 10*3/uL (ref 150–400)
RBC: 3.73 MIL/uL — ABNORMAL LOW (ref 3.87–5.11)
RDW: 16.1 % — ABNORMAL HIGH (ref 11.5–15.5)
WBC: 4.7 10*3/uL (ref 4.0–10.5)
nRBC: 0 % (ref 0.0–0.2)

## 2019-02-08 LAB — GLUCOSE, CAPILLARY
Glucose-Capillary: 93 mg/dL (ref 70–99)
Glucose-Capillary: 184 mg/dL — ABNORMAL HIGH (ref 70–99)
Glucose-Capillary: 122 mg/dL — ABNORMAL HIGH (ref 70–99)
Glucose-Capillary: 160 mg/dL — ABNORMAL HIGH (ref 70–99)

## 2019-02-08 LAB — MAGNESIUM: Magnesium: 2 mg/dL (ref 1.7–2.4)

## 2019-02-08 LAB — SARS CORONAVIRUS 2 (TAT 6-24 HRS): SARS Coronavirus 2: NEGATIVE

## 2019-02-08 MED ORDER — METHYLPREDNISOLONE SODIUM SUCC 125 MG IJ SOLR
60.0000 mg | Freq: Once | INTRAMUSCULAR | Status: AC
  Administered 2019-02-08: 60 mg via INTRAVENOUS
  Filled 2019-02-08: qty 2

## 2019-02-08 MED ORDER — PREDNISONE 20 MG PO TABS
40.0000 mg | ORAL_TABLET | Freq: Every day | ORAL | Status: AC
  Administered 2019-02-09 – 2019-02-12 (×4): 40 mg via ORAL
  Filled 2019-02-08 (×4): qty 2

## 2019-02-08 NOTE — Progress Notes (Signed)
Pharmacy note - antibiotics  Today is #7 of antibiotics (vancomycin + ceftriaxone) for cellulitis.  She was evaluated by orthopedics who recommends to continue IV antibiotics while hospitalized and change to orals at discharge.  Cultures are negative and MRSA nasal PCR is negative.  Asked Dr. Grandville Silos to see if vancomycin could be stopped today.  Orders received to discontinue vancomycin and continue ceftriaxone monotherapy.  Heide Guile, PharmD, BCPS-AQ ID Clinical Pharmacist [phone]

## 2019-02-08 NOTE — Plan of Care (Signed)
  Problem: Pain Managment: Goal: General experience of comfort will improve Outcome: Progressing   Problem: Safety: Goal: Ability to remain free from injury will improve Outcome: Progressing   Problem: Skin Integrity: Goal: Risk for impaired skin integrity will decrease Outcome: Progressing   

## 2019-02-08 NOTE — Progress Notes (Signed)
PROGRESS NOTE    April [name]  OFB:[id number] DOB: 1950-06-09 DOA: 02/02/2019 PCP: [name], MD    Brief Narrative:  69 year old female with history of hypertension, hyperlipidemia, diabetes mellitus type 2, neuropathy, chronic kidney disease stage III, CAD, asthma, morbid obesity, recent Covid infection in November requiring hospitalization and treatment with steroids/remdesivir, chronic right shin wound, chronic ambulatory dysfunction presented with worsening weakness along with new pain and rash around the right shin ulcer area along with painful blister on the bottom of the left foot.  She was started on antibiotics for cellulitis.    Assessment & Plan:   Active Problems:   Neuropathy   Cellulitis   Non-pressure chronic ulcer of left heel and midfoot limited to breakdown of skin (HCC)   Idiopathic chronic gout of multiple sites without tophus   SOB (shortness of breath)   Acute idiopathic gout of right foot   Morbid obesity (HCC)  #1 cellulitis surrounding right chronic wound of the shin,POA/left plantar foot blister with possible cellulitis, POA Patient admitted with cellulitis and plantar foot blister and placed on empiric IV vancomycin and IV Rocephin.  Plain films of bilateral feet as well as right tib-fib showed no acute lesions except some soft tissue swelling of the left foot.  Orthopedics was consulted patient seen in consultation by orthopedics who recommended nonweightbearing for both extremities and to continue antibiotics and transition to oral antibiotics on discharge.  MRSA nasal PCR was negative.  Patient noted to be on day 7 of antibiotics.  Discontinue IV vancomycin.  Continue IV Rocephin and likely transition to oral Keflex on discharge.  2.  Probable acute gout flare Patient with some exquisite tenderness to palpation on the right heel.  Patient with a history of uncontrolled gout per orthopedics.  Uric acid level was ordered which was elevated at 11.1.   We will give Solu-Medrol 60 mg IV x1, then prednisone 40 mg daily x4 more days to complete a 5-day course.  Follow.  3.  Acute on chronic ambulatory dysfunction/generalized deconditioning PT recommended SNF.  Social work consulted.  4.  Acute kidney injury on chronic kidney disease stage III Baseline creatinine fluctuates between 1.2-1.5.  Currently stable.  Patient's diuretics resumed on 02/05/2019 at a lower dose of 80 mg twice daily.  Patient with urine output of 2.5 L over the past 24 hours.  Patient noted to take 120 mg twice daily at home prior to admission.  Spironolactone currently on hold.  Follow.  5.  Chronic neuropathy Continue gabapentin which is renally dosed.  6.  Hypertension Continue low-dose Coreg, Lasix.  Spironolactone on hold could likely resume spironolactone in the next 1 to 2 days.  7.  Asthma/COPD Stable.  8.  Morbid obesity     DVT prophylaxis: Heparin Code Status: Full Family Communication: Updated patient.  No family at bedside. Disposition Plan: SNF   Consultants:   Orthopedics: Dr. [name] 02/04/2019  Procedures:   Chest x-ray pending 02/08/2019  Plain films of bilateral feet 02/03/2019  Plain films of the right tib-fib 02/03/2019  Antimicrobials:  IV Rocephin 02/02/2019  IV vancomycin 02/03/2019>>>> 02/08/2019   Subjective: Patient laying in bed.  Patient with some complaints of shortness of breath.  Patient also with some complaints of wheezing.  Patient complaining of left heel pain.  No chest pain.  Objective: Vitals:   02/08/19 0821 02/08/19 0827 02/08/19 1500 02/08/19 1611  BP: 127/67  (!) 114/53 (!) 114/58  Pulse: 73  67 75  Resp: 17  17   Temp: 99 F (37.2 C)  98.6 F (37 C)   TempSrc: Oral  Oral   SpO2: 91% 95% 96%   Weight:      Height:        Intake/Output Summary (Last 24 hours) at 02/08/2019 1937 Last data filed at 02/08/2019 1700 Gross per 24 hour  Intake 1200 ml  Output 2500 ml  Net -1300 ml   Filed Weights   02/03/19  1436 02/05/19 1730  Weight: (!) 161.3 kg (!) 157 kg    Examination:  General exam: Appears calm and comfortable  Respiratory system: Clear to auscultation. Respiratory effort normal. Cardiovascular system: S1 & S2 heard, RRR. No JVD, murmurs, rubs, gallops or clicks. No pedal edema. Gastrointestinal system: Abdomen is nondistended, soft and nontender. No organomegaly or masses felt. Normal bowel sounds heard. Central nervous system: Alert and oriented. No focal neurological deficits. Extremities: Blister noted on plantar surface of left foot.  Right lower extremity wrapped.  Patient with some exquisite tenderness to palpation right heel.  Skin: Blister noted on plantar surface of left foot.  Psychiatry: Judgement and insight appear normal. Mood & affect appropriate.     Data Reviewed: I have personally reviewed following labs and imaging studies  CBC: Recent Labs  Lab 02/02/19 1654 02/03/19 0329 02/04/19 0412 02/05/19 0332 02/08/19 0328  WBC 5.1 4.5 4.6 4.4 4.7  NEUTROABS 3.6  --  2.4 2.0 2.1  HGB 11.2* 10.1* 9.9* 10.1* 11.2*  HCT 36.3 31.6* 31.1* 33.8* 35.7*  MCV 95.5 92.9 94.2 97.1 95.7  PLT 235 202 185 202 263   Basic Metabolic Panel: Recent Labs  Lab 02/04/19 0412 02/05/19 0332 02/06/19 0300 02/07/19 0644 02/08/19 0328  NA 141 139 142 139 138  K 4.0 4.0 4.0 3.9 3.9  CL 104 104 103 101 97*  CO2 28 27 31 30 30   GLUCOSE 120* 98 92 95 115*  BUN 31* 20 17 18 20   CREATININE 1.35* 1.10* 1.22* 1.19* 1.27*  CALCIUM 8.4* 8.8* 8.5* 8.4* 8.6*  MG 2.3 2.3 2.1 2.0 2.0   GFR: Estimated Creatinine Clearance: 62.2 mL/min (A) (by C-G formula based on SCr of 1.27 mg/dL (H)). Liver Function Tests: No results for input(s): AST, ALT, ALKPHOS, BILITOT, PROT, ALBUMIN in the last 168 hours. No results for input(s): LIPASE, AMYLASE in the last 168 hours. No results for input(s): AMMONIA in the last 168 hours. Coagulation Profile: No results for input(s): INR, PROTIME in the last  168 hours. Cardiac Enzymes: No results for input(s): CKTOTAL, CKMB, CKMBINDEX, TROPONINI in the last 168 hours. BNP (last 3 results) No results for input(s): PROBNP in the last 8760 hours. HbA1C: No results for input(s): HGBA1C in the last 72 hours. CBG: Recent Labs  Lab 02/04/19 1558 02/07/19 2130 02/08/19 0647 02/08/19 1149 02/08/19 1549  GLUCAP 118* 102* 93 184* 122*   Lipid Profile: No results for input(s): CHOL, HDL, LDLCALC, TRIG, CHOLHDL, LDLDIRECT in the last 72 hours. Thyroid Function Tests: No results for input(s): TSH, T4TOTAL, FREET4, T3FREE, THYROIDAB in the last 72 hours. Anemia Panel: No results for input(s): VITAMINB12, FOLATE, FERRITIN, TIBC, IRON, RETICCTPCT in the last 72 hours. Sepsis Labs: Recent Labs  Lab 02/02/19 1654 02/02/19 1945  LATICACIDVEN 1.1 1.0    Recent Results (from the past 240 hour(s))  Culture, blood (routine x 2)     Status: None   Collection Time: 02/02/19  5:11 PM   Specimen: BLOOD  Result Value Ref Range Status   Specimen  Description BLOOD RIGHT ANTECUBITAL  Final   Special Requests   Final    BOTTLES DRAWN AEROBIC AND ANAEROBIC Blood Culture results may not be optimal due to an excessive volume of blood received in culture bottles   Culture   Final    NO GROWTH 5 DAYS Performed at [facility], [place] [address], [place], [place] [address]    Report Status 02/07/2019 FINAL  Final  Culture, blood (routine x 2)     Status: None   Collection Time: 02/02/19  5:11 PM   Specimen: BLOOD  Result Value Ref Range Status   Specimen Description BLOOD SITE NOT SPECIFIED  Final   Special Requests   Final    BOTTLES DRAWN AEROBIC AND ANAEROBIC Blood Culture results may not be optimal due to an excessive volume of blood received in culture bottles   Culture   Final    NO GROWTH 5 DAYS Performed at [facility], [place] [address] [place], [place] [address]    Report Status 02/07/2019 FINAL  Final  SARS CORONAVIRUS 2 (TAT 6-24  HRS) Nasopharyngeal Nasopharyngeal Swab     Status: None   Collection Time: 02/02/19  6:45 PM   Specimen: Nasopharyngeal Swab  Result Value Ref Range Status   SARS Coronavirus 2 NEGATIVE NEGATIVE Final    Comment: (NOTE) SARS-CoV-2 target nucleic acids are NOT DETECTED. The SARS-CoV-2 RNA is generally detectable in upper and lower respiratory specimens during the acute phase of infection. Negative results do not preclude SARS-CoV-2 infection, do not rule out co-infections with other pathogens, and should not be used as the sole basis for treatment or other patient management decisions. Negative results must be combined with clinical observations, patient history, and epidemiological information. The expected result is Negative. Fact Sheet for Patients: SugarRoll.be Fact Sheet for Healthcare Providers: https://www.woods-mathews.com/ This test is not yet approved or cleared by the [place] FDA and  has been authorized for detection and/or diagnosis of SARS-CoV-2 by FDA under an Emergency Use Authorization (EUA). This EUA will remain  in effect (meaning this test can be used) for the duration of the COVID-19 declaration under Section 56 4(b)(1) of the Act, 21 U.S.C. section 360bbb-3(b)(1), unless the authorization is terminated or revoked sooner. Performed at [facility], [place] [address] [place], [place] [address]   MRSA PCR Screening     Status: None   Collection Time: 02/03/19  6:30 AM   Specimen: Nasopharyngeal  Result Value Ref Range Status   MRSA by PCR NEGATIVE NEGATIVE Final    Comment:        The GeneXpert MRSA Assay (FDA approved for NASAL specimens only), is one component of a comprehensive MRSA colonization surveillance program. It is not intended to diagnose MRSA infection nor to guide or monitor treatment for MRSA infections. Performed at [facility], [place] [address] [place], [place] [address]   SARS  CORONAVIRUS 2 (TAT 6-24 HRS) Nasopharyngeal Nasopharyngeal Swab     Status: None   Collection Time: 02/05/19  9:52 AM   Specimen: Nasopharyngeal Swab  Result Value Ref Range Status   SARS Coronavirus 2 NEGATIVE NEGATIVE Final    Comment: (NOTE) SARS-CoV-2 target nucleic acids are NOT DETECTED. The SARS-CoV-2 RNA is generally detectable in upper and lower respiratory specimens during the acute phase of infection. Negative results do not preclude SARS-CoV-2 infection, do not rule out co-infections with other pathogens, and should not be used as the sole basis for treatment or  other patient management decisions. Negative results must be combined with clinical observations, patient history, and epidemiological information. The expected result is Negative. Fact Sheet for Patients: SugarRoll.be Fact Sheet for Healthcare Providers: https://www.woods-mathews.com/ This test is not yet approved or cleared by the [place] FDA and  has been authorized for detection and/or diagnosis of SARS-CoV-2 by FDA under an Emergency Use Authorization (EUA). This EUA will remain  in effect (meaning this test can be used) for the duration of the COVID-19 declaration under Section 56 4(b)(1) of the Act, 21 U.S.C. section 360bbb-3(b)(1), unless the authorization is terminated or revoked sooner. Performed at [facility], [place] [address], [place], [place] [address]          Radiology Studies: DG CHEST PORT 1 VIEW  Result Date: 02/08/2019 CLINICAL DATA:  Shortness of breath. EXAM: PORTABLE CHEST 1 VIEW COMPARISON:  12/06/2018 FINDINGS: Poor inspiration. Mild cardiomegaly. Aortic atherosclerosis. Possible pulmonary venous hypertension without frank edema, infiltrate, collapse or effusion. IMPRESSION: Mild cardiomegaly. Aortic atherosclerosis. Poor inspiration. Possible venous hypertension without frank edema. Electronically Signed   By: Nelson Chimes M.D.   On:  02/08/2019 14:09        Scheduled Meds: . allopurinol  100 mg Oral Daily  . aspirin EC  81 mg Oral QHS  . carvedilol  12.5 mg Oral BID WC  . colchicine  0.3 mg Oral Daily  . diclofenac sodium  2 g Topical QID  . famotidine  20 mg Oral BID  . ferrous sulfate  325 mg Oral Q breakfast  . fluticasone furoate-vilanterol  1 puff Inhalation Daily  . furosemide  80 mg Oral BID  . gabapentin  100 mg Oral TID  . heparin  5,000 Units Subcutaneous Q8H  . hydrocortisone cream   Topical BID  . lactobacillus acidophilus  2 tablet Oral TID  . montelukast  10 mg Oral QHS  . Oxcarbazepine  300 mg Oral BID  . silver sulfADIAZINE  1 application Topical Daily  . simvastatin  40 mg Oral QHS   Continuous Infusions: . cefTRIAXone (ROCEPHIN)  IV 2 g (02/07/19 2304)     LOS: 6 days    Time spent: 35 minutes    Irine Seal, MD [facility]  If 7PM-7AM, please contact night-coverage [email or web address] 02/08/2019, 7:37 PM

## 2019-02-08 NOTE — Progress Notes (Signed)
Physical Therapy Treatment Patient Details Name: April [name] MRN: [id number] DOB: December 05, 1950 Today's Date: 02/08/2019    History of Present Illness Pt is a 69 y.o. female with medical history significant of hypertension, hyperlipidemia, Dm2, neuropathy, CKD stage 3, CAD , Asthma, morbid obesity, recent Covid infection in November, presented with bilateral lower extremity cellulitis and generalized weakness.    PT Comments    Pt remains significantly limited secondary to pain and generalized weakness. Attempted to have pt scoot in bed to assess how she might do with an A-P transfer to chair; however, she was unsuccessful. Pt tolerated sitting EOB for ~10 minutes with min guard for safety. Pt would continue to benefit from skilled physical therapy services at this time while admitted and after d/c to address the below listed limitations in order to improve overall safety and independence with functional mobility.   Follow Up Recommendations  SNF     Equipment Recommendations  Other (comment)(defer to next venue of care)    Recommendations for Other Services       Precautions / Restrictions Precautions Precautions: Fall Restrictions Weight Bearing Restrictions: Yes RLE Weight Bearing: Non weight bearing LLE Weight Bearing: Non weight bearing    Mobility  Bed Mobility Overal bed mobility: Needs Assistance Bed Mobility: Supine to Sit;Sit to Supine     Supine to sit: Mod assist;+2 for physical assistance Sit to supine: Mod assist;+2 for physical assistance   General bed mobility comments: increased time and effort needed, use of bed rails, heavy assistance needed for bilateral LE management as well as for trunk elevation to achieve upright sitting at EOB towards pt's L side  Transfers                 General transfer comment: deferred; attempted scoot in bed to see if pt would be able to perform an A-P transfer to chair, but pt unable to perform; she would require the  Maximove at this point  Ambulation/Gait                 Stairs             Wheelchair Mobility    Modified Rankin (Stroke Patients Only)       Balance Overall balance assessment: Needs assistance Sitting-balance support: Single extremity supported;Bilateral upper extremity supported Sitting balance-Leahy Scale: Poor                                      Cognition Arousal/Alertness: Awake/alert Behavior During Therapy: WFL for tasks assessed/performed Overall Cognitive Status: Within Functional Limits for tasks assessed                                        Exercises General Exercises - Lower Extremity Ankle Circles/Pumps: AAROM;Left;10 reps;Seated    General Comments        Pertinent Vitals/Pain Pain Assessment: Faces Faces Pain Scale: Hurts even more Pain Location: bilateral LEs, groin Pain Descriptors / Indicators: Grimacing;Guarding Pain Intervention(s): Monitored during session;Repositioned    Home Living                      Prior Function            PT Goals (current goals can now be found in the care plan section) Acute Rehab PT Goals  PT Goal Formulation: With patient Time For Goal Achievement: 02/17/19 Potential to Achieve Goals: Fair Progress towards PT goals: Progressing toward goals    Frequency    Min 2X/week      PT Plan Current plan remains appropriate    Co-evaluation              AM-PAC PT "6 Clicks" Mobility   Outcome Measure  Help needed turning from your back to your side while in a flat bed without using bedrails?: A Lot Help needed moving from lying on your back to sitting on the side of a flat bed without using bedrails?: A Lot Help needed moving to and from a bed to a chair (including a wheelchair)?: Total Help needed standing up from a chair using your arms (e.g., wheelchair or bedside chair)?: Total Help needed to walk in hospital room?: Total Help needed  climbing 3-5 steps with a railing? : Total 6 Click Score: 8    End of Session   Activity Tolerance: Patient limited by pain Patient left: in bed;with call bell/phone within reach Nurse Communication: Mobility status PT Visit Diagnosis: Muscle weakness (generalized) (M62.81);Pain Pain - Right/Left: (bilateral) Pain - part of body: Leg     Time: 0086-7619 PT Time Calculation (min) (ACUTE ONLY): 27 min  Charges:  $Therapeutic Activity: 23-37 mins                     Anastasio Champion, DPT  Acute Rehabilitation Services Pager [phone] Office [place] 02/08/2019, 2:22 PM

## 2019-02-09 LAB — CBC WITH DIFFERENTIAL/PLATELET
Abs Immature Granulocytes: 0 10*3/uL (ref 0.00–0.07)
Basophils Absolute: 0 10*3/uL (ref 0.0–0.1)
Basophils Relative: 1 %
Eosinophils Absolute: 0 10*3/uL (ref 0.0–0.5)
Eosinophils Relative: 0 %
HCT: 36.5 % (ref 36.0–46.0)
Hemoglobin: 11.4 g/dL — ABNORMAL LOW (ref 12.0–15.0)
Lymphocytes Relative: 12 %
Lymphs Abs: 0.4 10*3/uL — ABNORMAL LOW (ref 0.7–4.0)
MCH: 29.7 pg (ref 26.0–34.0)
MCHC: 31.2 g/dL (ref 30.0–36.0)
MCV: 95.1 fL (ref 80.0–100.0)
Monocytes Absolute: 0 10*3/uL — ABNORMAL LOW (ref 0.1–1.0)
Monocytes Relative: 0 %
Neutro Abs: 3 10*3/uL (ref 1.7–7.7)
Neutrophils Relative %: 87 %
Platelets: 239 10*3/uL (ref 150–400)
RBC: 3.84 MIL/uL — ABNORMAL LOW (ref 3.87–5.11)
RDW: 15.8 % — ABNORMAL HIGH (ref 11.5–15.5)
WBC: 3.5 10*3/uL — ABNORMAL LOW (ref 4.0–10.5)
nRBC: 0 % (ref 0.0–0.2)
nRBC: 0 /100 WBC

## 2019-02-09 LAB — BASIC METABOLIC PANEL
Anion gap: 15 (ref 5–15)
BUN: 25 mg/dL — ABNORMAL HIGH (ref 8–23)
CO2: 27 mmol/L (ref 22–32)
Calcium: 8.9 mg/dL (ref 8.9–10.3)
Chloride: 96 mmol/L — ABNORMAL LOW (ref 98–111)
Creatinine, Ser: 1.14 mg/dL — ABNORMAL HIGH (ref 0.44–1.00)
GFR calc Af Amer: 57 mL/min — ABNORMAL LOW (ref >60)
GFR calc non Af Amer: 49 mL/min — ABNORMAL LOW (ref >60)
Glucose, Bld: 226 mg/dL — ABNORMAL HIGH (ref 70–99)
Potassium: 4.4 mmol/L (ref 3.5–5.1)
Sodium: 138 mmol/L (ref 135–145)

## 2019-02-09 LAB — GLUCOSE, CAPILLARY: Glucose-Capillary: 196 mg/dL — ABNORMAL HIGH (ref 70–99)

## 2019-02-09 LAB — MAGNESIUM: Magnesium: 2 mg/dL (ref 1.7–2.4)

## 2019-02-09 NOTE — Progress Notes (Signed)
PROGRESS NOTE    April [name]  EHM:[id number] DOB: [birth year] DOA: 02/02/2019 PCP: [name], MD    Brief Narrative:  69 year old female with history of hypertension, hyperlipidemia, diabetes mellitus type 2, neuropathy, chronic kidney disease stage III, CAD, asthma, morbid obesity, recent Covid infection in November requiring hospitalization and treatment with steroids/remdesivir, chronic right shin wound, chronic ambulatory dysfunction presented with worsening weakness along with new pain and rash around the right shin ulcer area along with painful blister on the bottom of the left foot.  She was started on antibiotics for cellulitis.    Assessment & Plan:   Active Problems:   Neuropathy   Cellulitis   Non-pressure chronic ulcer of left heel and midfoot limited to breakdown of skin (HCC)   Idiopathic chronic gout of multiple sites without tophus   SOB (shortness of breath)   Acute idiopathic gout of right foot   Morbid obesity (HCC)  1 cellulitis surrounding right chronic wound of the shin,POA/left plantar foot blister with possible cellulitis, POA Patient admitted with cellulitis and plantar foot blister and placed on empiric IV vancomycin and IV Rocephin.  Plain films of bilateral feet as well as right tib-fib showed no acute lesions except some soft tissue swelling of the left foot.  Orthopedics was consulted patient seen in consultation by orthopedics who recommended nonweightbearing for both extremities and to continue antibiotics and transition to oral antibiotics on discharge.  MRSA nasal PCR was negative.  Patient noted to be on day 8 of antibiotics.  IV vancomycin has been discontinued.  Continue IV Rocephin.  Follow.   2.  Probable acute gout flare Patient with some exquisite tenderness to palpation on the right heel.  Patient with a history of uncontrolled gout per orthopedics.  Uric acid level was ordered which was elevated at 11.1.  Status post Solu-Medrol 60 mg  IV x1 on 02/08/2019.  Continue oral prednisone 40 mg daily x4 more days to complete a 5-day course.  Follow.   3.  Acute on chronic ambulatory dysfunction/generalized deconditioning PT recommended SNF.  Social work consulted.  4.  Acute kidney injury on chronic kidney disease stage III Baseline creatinine fluctuates between 1.2-1.5.  Currently stable.  Patient's diuretics resumed on 02/05/2019 at a lower dose of 80 mg twice daily.  Patient with urine output of 1.9 L over the past 24 hours.  Patient noted to take 120 mg twice daily at home prior to admission.  Spironolactone currently on hold.  Blood pressure somewhat borderline.  Renal function improved and creatinine currently at 1.14.  Potassium at 4.4 and as such was subsequently continue to hold spironolactone.  Follow.  5.  Chronic neuropathy Continue gabapentin which is renally dosed.  6.  Hypertension Blood pressure somewhat borderline.  Continue Coreg and Lasix.  Continue to hold spironolactone.  Follow.  7.  Asthma/COPD Stable.  8.  Morbid obesity     DVT prophylaxis: Heparin Code Status: Full Family Communication: Updated patient.  No family at bedside. Disposition Plan: SNF when bed available hopefully in the next 24 to 48 hours.   Consultants:   Orthopedics: Dr. [name] 02/04/2019  Procedures:   Chest x-ray pending 02/08/2019  Plain films of bilateral feet 02/03/2019  Plain films of the right tib-fib 02/03/2019  Antimicrobials:  IV Rocephin 02/02/2019  IV vancomycin 02/03/2019>>>> 02/08/2019   Subjective: Patient laying in bed.  States some improvement with shortness of breath.  Still with complaints of left heel pain.  No chest pain.  Objective: Vitals:   02/08/19 2044 02/09/19 0351 02/09/19 0915 02/09/19 1102  BP: (!) 129/104 (!) 143/54  92/79  Pulse: 78 73  85  Resp: 18 18  17   Temp: 99.4 F (37.4 C) 99.1 F (37.3 C)  98.2 F (36.8 C)  TempSrc: Oral Oral  Oral  SpO2: 91% 91% 93% 95%  Weight:      Height:         Intake/Output Summary (Last 24 hours) at 02/09/2019 1243 Last data filed at 02/09/2019 0500 Gross per 24 hour  Intake 1540 ml  Output 1900 ml  Net -360 ml   Filed Weights   02/03/19 1436 02/05/19 1730  Weight: (!) 161.3 kg (!) 157 kg    Examination:  General exam: NAD.  Respiratory system: CTAB.  No wheezes, no crackles, no rhonchi.  Normal respiratory effort.   Cardiovascular system: Regular rate and rhythm no murmurs rubs or gallops.  No JVD.  No lower extremity edema.  Gastrointestinal system: Abdomen is soft, nontender, nondistended, positive bowel sounds.  No rebound.  No guarding.  Central nervous system: Alert and oriented. No focal neurological deficits. Extremities: Blister noted on plantar surface of left foot.  Right lower extremity wrapped.  Patient with some exquisite tenderness to palpation right heel.  Skin: Blister noted on plantar surface of left foot.  Psychiatry: Judgement and insight appear normal. Mood & affect appropriate.     Data Reviewed: I have personally reviewed following labs and imaging studies  CBC: Recent Labs  Lab 02/02/19 1654 02/03/19 0329 02/04/19 0412 02/05/19 0332 02/08/19 0328 02/09/19 0445  WBC 5.1 4.5 4.6 4.4 4.7 3.5*  NEUTROABS 3.6  --  2.4 2.0 2.1 3.0  HGB 11.2* 10.1* 9.9* 10.1* 11.2* 11.4*  HCT 36.3 31.6* 31.1* 33.8* 35.7* 36.5  MCV 95.5 92.9 94.2 97.1 95.7 95.1  PLT 235 202 185 202 206 960   Basic Metabolic Panel: Recent Labs  Lab 02/05/19 0332 02/06/19 0300 02/07/19 0644 02/08/19 0328 02/09/19 0445  NA 139 142 139 138 138  K 4.0 4.0 3.9 3.9 4.4  CL 104 103 101 97* 96*  CO2 27 31 30 30 27   GLUCOSE 98 92 95 115* 226*  BUN 20 17 18 20  25*  CREATININE 1.10* 1.22* 1.19* 1.27* 1.14*  CALCIUM 8.8* 8.5* 8.4* 8.6* 8.9  MG 2.3 2.1 2.0 2.0 2.0   GFR: Estimated Creatinine Clearance: 69.3 mL/min (A) (by C-G formula based on SCr of 1.14 mg/dL (H)). Liver Function Tests: No results for input(s): AST, ALT, ALKPHOS,  BILITOT, PROT, ALBUMIN in the last 168 hours. No results for input(s): LIPASE, AMYLASE in the last 168 hours. No results for input(s): AMMONIA in the last 168 hours. Coagulation Profile: No results for input(s): INR, PROTIME in the last 168 hours. Cardiac Enzymes: No results for input(s): CKTOTAL, CKMB, CKMBINDEX, TROPONINI in the last 168 hours. BNP (last 3 results) No results for input(s): PROBNP in the last 8760 hours. HbA1C: No results for input(s): HGBA1C in the last 72 hours. CBG: Recent Labs  Lab 02/08/19 0647 02/08/19 1149 02/08/19 1549 02/08/19 2224 02/09/19 0636  GLUCAP 93 184* 122* 160* 196*   Lipid Profile: No results for input(s): CHOL, HDL, LDLCALC, TRIG, CHOLHDL, LDLDIRECT in the last 72 hours. Thyroid Function Tests: No results for input(s): TSH, T4TOTAL, FREET4, T3FREE, THYROIDAB in the last 72 hours. Anemia Panel: No results for input(s): VITAMINB12, FOLATE, FERRITIN, TIBC, IRON, RETICCTPCT in the last 72 hours. Sepsis Labs: Recent Labs  Lab 02/02/19 1654  02/02/19 1945  LATICACIDVEN 1.1 1.0    Recent Results (from the past 240 hour(s))  Culture, blood (routine x 2)     Status: None   Collection Time: 02/02/19  5:11 PM   Specimen: BLOOD  Result Value Ref Range Status   Specimen Description BLOOD RIGHT ANTECUBITAL  Final   Special Requests   Final    BOTTLES DRAWN AEROBIC AND ANAEROBIC Blood Culture results may not be optimal due to an excessive volume of blood received in culture bottles   Culture   Final    NO GROWTH 5 DAYS Performed at [facility], [place] [address], [place], [place] [address]    Report Status 02/07/2019 FINAL  Final  Culture, blood (routine x 2)     Status: None   Collection Time: 02/02/19  5:11 PM   Specimen: BLOOD  Result Value Ref Range Status   Specimen Description BLOOD SITE NOT SPECIFIED  Final   Special Requests   Final    BOTTLES DRAWN AEROBIC AND ANAEROBIC Blood Culture results may not be optimal due to an  excessive volume of blood received in culture bottles   Culture   Final    NO GROWTH 5 DAYS Performed at [facility], [place] [address] [place], [place] [address]    Report Status 02/07/2019 FINAL  Final  SARS CORONAVIRUS 2 (TAT 6-24 HRS) Nasopharyngeal Nasopharyngeal Swab     Status: None   Collection Time: 02/02/19  6:45 PM   Specimen: Nasopharyngeal Swab  Result Value Ref Range Status   SARS Coronavirus 2 NEGATIVE NEGATIVE Final    Comment: (NOTE) SARS-CoV-2 target nucleic acids are NOT DETECTED. The SARS-CoV-2 RNA is generally detectable in upper and lower respiratory specimens during the acute phase of infection. Negative results do not preclude SARS-CoV-2 infection, do not rule out co-infections with other pathogens, and should not be used as the sole basis for treatment or other patient management decisions. Negative results must be combined with clinical observations, patient history, and epidemiological information. The expected result is Negative. Fact Sheet for Patients: SugarRoll.be Fact Sheet for Healthcare Providers: https://www.woods-mathews.com/ This test is not yet approved or cleared by the [place] FDA and  has been authorized for detection and/or diagnosis of SARS-CoV-2 by FDA under an Emergency Use Authorization (EUA). This EUA will remain  in effect (meaning this test can be used) for the duration of the COVID-19 declaration under Section 56 4(b)(1) of the Act, 21 U.S.C. section 360bbb-3(b)(1), unless the authorization is terminated or revoked sooner. Performed at [facility], [place] [address] [place], [place] [address]   MRSA PCR Screening     Status: None   Collection Time: 02/03/19  6:30 AM   Specimen: Nasopharyngeal  Result Value Ref Range Status   MRSA by PCR NEGATIVE NEGATIVE Final    Comment:        The GeneXpert MRSA Assay (FDA approved for NASAL specimens only), is one component of  a comprehensive MRSA colonization surveillance program. It is not intended to diagnose MRSA infection nor to guide or monitor treatment for MRSA infections. Performed at [facility], [place] [address] [place], [place] [address]   SARS CORONAVIRUS 2 (TAT 6-24 HRS) Nasopharyngeal Nasopharyngeal Swab     Status: None   Collection Time: 02/05/19  9:52 AM   Specimen: Nasopharyngeal Swab  Result Value Ref Range Status   SARS Coronavirus 2 NEGATIVE NEGATIVE Final    Comment: (NOTE) SARS-CoV-2 target nucleic  acids are NOT DETECTED. The SARS-CoV-2 RNA is generally detectable in upper and lower respiratory specimens during the acute phase of infection. Negative results do not preclude SARS-CoV-2 infection, do not rule out co-infections with other pathogens, and should not be used as the sole basis for treatment or other patient management decisions. Negative results must be combined with clinical observations, patient history, and epidemiological information. The expected result is Negative. Fact Sheet for Patients: SugarRoll.be Fact Sheet for Healthcare Providers: https://www.woods-mathews.com/ This test is not yet approved or cleared by the [place] FDA and  has been authorized for detection and/or diagnosis of SARS-CoV-2 by FDA under an Emergency Use Authorization (EUA). This EUA will remain  in effect (meaning this test can be used) for the duration of the COVID-19 declaration under Section 56 4(b)(1) of the Act, 21 U.S.C. section 360bbb-3(b)(1), unless the authorization is terminated or revoked sooner. Performed at [facility], [place] [address], [place], [place] [address]   SARS CORONAVIRUS 2 (TAT 6-24 HRS) Nasopharyngeal Nasopharyngeal Swab     Status: None   Collection Time: 02/08/19  2:09 PM   Specimen: Nasopharyngeal Swab  Result Value Ref Range Status   SARS Coronavirus 2 NEGATIVE NEGATIVE Final    Comment:  (NOTE) SARS-CoV-2 target nucleic acids are NOT DETECTED. The SARS-CoV-2 RNA is generally detectable in upper and lower respiratory specimens during the acute phase of infection. Negative results do not preclude SARS-CoV-2 infection, do not rule out co-infections with other pathogens, and should not be used as the sole basis for treatment or other patient management decisions. Negative results must be combined with clinical observations, patient history, and epidemiological information. The expected result is Negative. Fact Sheet for Patients: SugarRoll.be Fact Sheet for Healthcare Providers: https://www.woods-mathews.com/ This test is not yet approved or cleared by the [place] FDA and  has been authorized for detection and/or diagnosis of SARS-CoV-2 by FDA under an Emergency Use Authorization (EUA). This EUA will remain  in effect (meaning this test can be used) for the duration of the COVID-19 declaration under Section 56 4(b)(1) of the Act, 21 U.S.C. section 360bbb-3(b)(1), unless the authorization is terminated or revoked sooner. Performed at [facility], [place] [address], [place], [place] [address]          Radiology Studies: DG CHEST PORT 1 VIEW  Result Date: 02/08/2019 CLINICAL DATA:  Shortness of breath. EXAM: PORTABLE CHEST 1 VIEW COMPARISON:  12/06/2018 FINDINGS: Poor inspiration. Mild cardiomegaly. Aortic atherosclerosis. Possible pulmonary venous hypertension without frank edema, infiltrate, collapse or effusion. IMPRESSION: Mild cardiomegaly. Aortic atherosclerosis. Poor inspiration. Possible venous hypertension without frank edema. Electronically Signed   By: Nelson Chimes M.D.   On: 02/08/2019 14:09        Scheduled Meds: . allopurinol  100 mg Oral Daily  . aspirin EC  81 mg Oral QHS  . carvedilol  12.5 mg Oral BID WC  . colchicine  0.3 mg Oral Daily  . diclofenac sodium  2 g Topical QID  . famotidine  20 mg  Oral BID  . ferrous sulfate  325 mg Oral Q breakfast  . fluticasone furoate-vilanterol  1 puff Inhalation Daily  . furosemide  80 mg Oral BID  . gabapentin  100 mg Oral TID  . heparin  5,000 Units Subcutaneous Q8H  . hydrocortisone cream   Topical BID  . lactobacillus acidophilus  2 tablet Oral TID  . montelukast  10 mg Oral QHS  . Oxcarbazepine  300 mg Oral BID  .  predniSONE  40 mg Oral QAC breakfast  . silver sulfADIAZINE  1 application Topical Daily  . simvastatin  40 mg Oral QHS   Continuous Infusions: . cefTRIAXone (ROCEPHIN)  IV Stopped (02/08/19 2214)     LOS: 7 days    Time spent: 35 minutes    Irine Seal, MD [facility]  If 7PM-7AM, please contact night-coverage [email or web address] 02/09/2019, 12:43 PM

## 2019-02-09 NOTE — Progress Notes (Signed)
Right lower dressing changed- unhealed pink area. Medicated as ordered

## 2019-02-10 ENCOUNTER — Ambulatory Visit: Payer: Medicare Other | Admitting: Physician Assistant

## 2019-02-10 LAB — BASIC METABOLIC PANEL
Anion gap: 12 (ref 5–15)
BUN: 26 mg/dL — ABNORMAL HIGH (ref 8–23)
CO2: 29 mmol/L (ref 22–32)
Calcium: 8.9 mg/dL (ref 8.9–10.3)
Chloride: 97 mmol/L — ABNORMAL LOW (ref 98–111)
Creatinine, Ser: 1.1 mg/dL — ABNORMAL HIGH (ref 0.44–1.00)
GFR calc Af Amer: 60 mL/min — ABNORMAL LOW (ref >60)
GFR calc non Af Amer: 52 mL/min — ABNORMAL LOW (ref >60)
Glucose, Bld: 144 mg/dL — ABNORMAL HIGH (ref 70–99)
Potassium: 4.1 mmol/L (ref 3.5–5.1)
Sodium: 138 mmol/L (ref 135–145)

## 2019-02-10 LAB — CBC
HCT: 35.2 % — ABNORMAL LOW (ref 36.0–46.0)
Hemoglobin: 11.2 g/dL — ABNORMAL LOW (ref 12.0–15.0)
MCH: 30.3 pg (ref 26.0–34.0)
MCHC: 31.8 g/dL (ref 30.0–36.0)
MCV: 95.1 fL (ref 80.0–100.0)
Platelets: 231 10*3/uL (ref 150–400)
RBC: 3.7 MIL/uL — ABNORMAL LOW (ref 3.87–5.11)
RDW: 15.9 % — ABNORMAL HIGH (ref 11.5–15.5)
WBC: 6.5 10*3/uL (ref 4.0–10.5)
nRBC: 0 % (ref 0.0–0.2)

## 2019-02-10 LAB — MAGNESIUM: Magnesium: 2.2 mg/dL (ref 1.7–2.4)

## 2019-02-10 MED ORDER — FUROSEMIDE 40 MG PO TABS
120.0000 mg | ORAL_TABLET | Freq: Two times a day (BID) | ORAL | Status: DC
  Administered 2019-02-10 – 2019-02-22 (×25): 120 mg via ORAL
  Filled 2019-02-10 (×26): qty 3

## 2019-02-10 MED ORDER — FUROSEMIDE 10 MG/ML IJ SOLN
40.0000 mg | Freq: Once | INTRAMUSCULAR | Status: AC
  Administered 2019-02-10: 16:00:00 40 mg via INTRAVENOUS
  Filled 2019-02-10: qty 4

## 2019-02-10 MED ORDER — SPIRONOLACTONE 25 MG PO TABS
25.0000 mg | ORAL_TABLET | Freq: Every day | ORAL | Status: DC
  Administered 2019-02-10 – 2019-03-01 (×20): 25 mg via ORAL
  Filled 2019-02-10 (×22): qty 1

## 2019-02-10 MED ORDER — IPRATROPIUM-ALBUTEROL 0.5-2.5 (3) MG/3ML IN SOLN
3.0000 mL | RESPIRATORY_TRACT | Status: DC | PRN
  Administered 2019-02-10: 02:00:00 3 mL via RESPIRATORY_TRACT
  Filled 2019-02-10: qty 3

## 2019-02-10 NOTE — Progress Notes (Signed)
Physical Therapy Treatment Patient Details Name: April [name] MRN: [id number] DOB: December 31, 1950 Today's Date: 02/10/2019    History of Present Illness Pt is a 69 y.o. female with medical history significant of hypertension, hyperlipidemia, Dm2, neuropathy, CKD stage 3, CAD , Asthma, morbid obesity, recent Covid infection in November, presented with bilateral lower extremity cellulitis and generalized weakness.    PT Comments    Pt remains greatly limited secondary to bilateral LE pain and inability to weight bear through LEs. Pt currently does not have the upper body strength to be able to scoot (either A-P or laterally) to perform OOB transfer and she refuses the use of a Maximove. Decreasing frequency to once a week for monitoring for any progress as pt is very limited.     Follow Up Recommendations  SNF     Equipment Recommendations  Other (comment)(defer to next venue of care)    Recommendations for Other Services       Precautions / Restrictions Precautions Precautions: Fall Restrictions Weight Bearing Restrictions: Yes RLE Weight Bearing: Non weight bearing LLE Weight Bearing: Non weight bearing    Mobility  Bed Mobility Overal bed mobility: Needs Assistance Bed Mobility: Supine to Sit     Supine to sit: +2 for physical assistance;Min assist     General bed mobility comments: increased time and effort needed, use of bed rails, assistance to move bilateral LEs off of bed and use of bed rails to assist with trunk elevation  Transfers                 General transfer comment: pt unable to perform a scoot transfer at this time and would require the Maximove (however, pt refusing)  Ambulation/Gait                 Stairs             Wheelchair Mobility    Modified Rankin (Stroke Patients Only)       Balance Overall balance assessment: Needs assistance Sitting-balance support: Single extremity supported;No upper extremity  supported Sitting balance-Leahy Scale: Fair Sitting balance - Comments: pt progressing to no UE supports needed and able to maintain upright sitting at EOB with supervision                                    Cognition Arousal/Alertness: Awake/alert Behavior During Therapy: WFL for tasks assessed/performed Overall Cognitive Status: Within Functional Limits for tasks assessed                                        Exercises General Exercises - Lower Extremity Ankle Circles/Pumps: AROM;Both;20 reps;Seated Long Arc Quad: AAROM;Both;10 reps;Seated Other Exercises Other Exercises: resisted elbow flexion extension in sitting position    General Comments        Pertinent Vitals/Pain Pain Assessment: Faces Faces Pain Scale: Hurts even more Pain Location: bilateral LEs, groin Pain Descriptors / Indicators: Grimacing;Guarding Pain Intervention(s): Monitored during session;Repositioned    Home Living                      Prior Function            PT Goals (current goals can now be found in the care plan section) Acute Rehab PT Goals PT Goal Formulation: With patient Time For Goal  Achievement: 02/17/19 Potential to Achieve Goals: Fair Progress towards PT goals: Progressing toward goals    Frequency    Min 1X/week      PT Plan Frequency needs to be updated    Co-evaluation              AM-PAC PT "6 Clicks" Mobility   Outcome Measure  Help needed turning from your back to your side while in a flat bed without using bedrails?: A Lot Help needed moving from lying on your back to sitting on the side of a flat bed without using bedrails?: A Lot Help needed moving to and from a bed to a chair (including a wheelchair)?: Total Help needed standing up from a chair using your arms (e.g., wheelchair or bedside chair)?: Total Help needed to walk in hospital room?: Total Help needed climbing 3-5 steps with a railing? : Total 6 Click  Score: 8    End of Session   Activity Tolerance: Patient limited by pain Patient left: in bed;with call bell/phone within reach;with bed alarm set;Other (comment)(sitting EOB) Nurse Communication: Mobility status PT Visit Diagnosis: Muscle weakness (generalized) (M62.81);Pain Pain - Right/Left: (bilateral) Pain - part of body: Leg     Time: 0175-1025 PT Time Calculation (min) (ACUTE ONLY): 19 min  Charges:  $Therapeutic Activity: 8-22 mins                     Anastasio Champion, DPT  Acute Rehabilitation Services Pager [phone] Office [place] 02/10/2019, 2:42 PM

## 2019-02-10 NOTE — Progress Notes (Signed)
PROGRESS NOTE    April [name]  KGU:[id number] DOB: 07-08-1950 DOA: 02/02/2019 PCP: [name], MD    Brief Narrative:  69 year old female with history of hypertension, hyperlipidemia, diabetes mellitus type 2, neuropathy, chronic kidney disease stage III, CAD, asthma, morbid obesity, recent Covid infection in November requiring hospitalization and treatment with steroids/remdesivir, chronic right shin wound, chronic ambulatory dysfunction presented with worsening weakness along with new pain and rash around the right shin ulcer area along with painful blister on the bottom of the left foot.  She was started on antibiotics for cellulitis.    Assessment & Plan:   Active Problems:   Neuropathy   Cellulitis   Non-pressure chronic ulcer of left heel and midfoot limited to breakdown of skin (HCC)   Idiopathic chronic gout of multiple sites without tophus   SOB (shortness of breath)   Acute idiopathic gout of right foot   Morbid obesity (HCC)  1 cellulitis surrounding right chronic wound of the shin,POA/left plantar foot blister with possible cellulitis, POA Patient admitted with cellulitis and plantar foot blister and placed on empiric IV vancomycin and IV Rocephin.  Plain films of bilateral feet as well as right tib-fib showed no acute lesions except some soft tissue swelling of the left foot.  Orthopedics was consulted patient seen in consultation by orthopedics who recommended nonweightbearing for both extremities and to continue antibiotics and transition to oral antibiotics on discharge.  MRSA nasal PCR was negative.  Patient noted to be on day 9 of antibiotics.  IV vancomycin has been discontinued.  Continue IV Rocephin.  Follow.   2.  Probable acute gout flare Patient with some exquisite tenderness to palpation on the right heel.  Patient with a history of uncontrolled gout per orthopedics.  Uric acid level was ordered which was elevated at 11.1.  Status post Solu-Medrol 60 mg  IV x1 on 02/08/2019.  Continue oral prednisone 40 mg daily to complete a 5-day course of treatment.  Post flare will likely need to be started on allopurinol however will defer to outpatient setting.  3.  Acute on chronic ambulatory dysfunction/generalized deconditioning PT recommended SNF.  Social work consulted.  4.  Acute kidney injury on chronic kidney disease stage III Baseline creatinine fluctuates between 1.2-1.5.  Currently stable.  Patient's diuretics resumed on 02/05/2019 at a lower dose of 80 mg twice daily.  Patient with urine output of 450 cc over the past 24 hours.  Patient noted to take 120 mg twice daily at home prior to admission.  Spironolactone currently on hold.  Blood pressure improved.  Renal function improved and creatinine currently at 1.10.  Potassium at 4.1.  Resume home regimen spironolactone.  Increase Lasix back to home dose of 120 mg twice daily.  Follow.   5.  Chronic neuropathy Continue gabapentin which is renally dosed.  6.  Hypertension Blood pressure is borderline however has improved.  Continue Coreg.  Increase Lasix back to home regimen of 120 mg twice daily.  Resume home regimen spironolactone.  7.  Asthma/COPD Stable.  8.  Morbid obesity     DVT prophylaxis: Heparin Code Status: Full Family Communication: Updated patient.  No family at bedside. Disposition Plan: SNF when bed available.   Consultants:   Orthopedics: Dr. [name] 02/04/2019  Procedures:   Chest x-ray pending 02/08/2019  Plain films of bilateral feet 02/03/2019  Plain films of the right tib-fib 02/03/2019  Antimicrobials:  IV Rocephin 02/02/2019  IV vancomycin 02/03/2019>>>> 02/08/2019   Subjective: Patient sitting  up on the side of the bed.  States some shortness of breath.  Still with complaints of left heel pain.  Denies any chest pain.   Objective: Vitals:   02/10/19 0421 02/10/19 0500 02/10/19 0735 02/10/19 0854  BP: (!) 134/57  129/66   Pulse: 68  67   Resp: 16  18   Temp:  99.1 F (37.3 C)  98.5 F (36.9 C)   TempSrc: Oral  Oral   SpO2: 97%  97% 90%  Weight:  (!) 164.8 kg    Height:        Intake/Output Summary (Last 24 hours) at 02/10/2019 1145 Last data filed at 02/10/2019 1134 Gross per 24 hour  Intake 240 ml  Output 1450 ml  Net -1210 ml   Filed Weights   02/03/19 1436 02/05/19 1730 02/10/19 0500  Weight: (!) 161.3 kg (!) 157 kg (!) 164.8 kg    Examination:  General exam: NAD.  Respiratory system: Scattered crackles diffusely.  No wheezing.  No rhonchi.  Normal respiratory effort.  Cardiovascular system: RRR no murmurs rubs or gallops.  Regular rate and rhythm no murmurs rubs or gallops.  No JVD.  No lower extremity edema.  Gastrointestinal system: Abdomen is soft, nontender, nondistended, positive bowel sounds.  No rebound.  No guarding.  Central nervous system: Alert and oriented. No focal neurological deficits. Extremities: Blister noted on plantar surface of left foot.  Right lower extremity wrapped.  Patient with less exquisite tenderness to palpation right heel.  Skin: Blister noted on plantar surface of left foot.  Psychiatry: Judgement and insight appear normal. Mood & affect appropriate.     Data Reviewed: I have personally reviewed following labs and imaging studies  CBC: Recent Labs  Lab 02/04/19 0412 02/05/19 0332 02/08/19 0328 02/09/19 0445 02/10/19 0134  WBC 4.6 4.4 4.7 3.5* 6.5  NEUTROABS 2.4 2.0 2.1 3.0  --   HGB 9.9* 10.1* 11.2* 11.4* 11.2*  HCT 31.1* 33.8* 35.7* 36.5 35.2*  MCV 94.2 97.1 95.7 95.1 95.1  PLT 185 202 206 239 409   Basic Metabolic Panel: Recent Labs  Lab 02/06/19 0300 02/07/19 0644 02/08/19 0328 02/09/19 0445 02/10/19 0134  NA 142 139 138 138 138  K 4.0 3.9 3.9 4.4 4.1  CL 103 101 97* 96* 97*  CO2 31 30 30 27 29   GLUCOSE 92 95 115* 226* 144*  BUN 17 18 20  25* 26*  CREATININE 1.22* 1.19* 1.27* 1.14* 1.10*  CALCIUM 8.5* 8.4* 8.6* 8.9 8.9  MG 2.1 2.0 2.0 2.0 2.2   GFR: Estimated  Creatinine Clearance: 74.2 mL/min (A) (by C-G formula based on SCr of 1.1 mg/dL (H)). Liver Function Tests: No results for input(s): AST, ALT, ALKPHOS, BILITOT, PROT, ALBUMIN in the last 168 hours. No results for input(s): LIPASE, AMYLASE in the last 168 hours. No results for input(s): AMMONIA in the last 168 hours. Coagulation Profile: No results for input(s): INR, PROTIME in the last 168 hours. Cardiac Enzymes: No results for input(s): CKTOTAL, CKMB, CKMBINDEX, TROPONINI in the last 168 hours. BNP (last 3 results) No results for input(s): PROBNP in the last 8760 hours. HbA1C: No results for input(s): HGBA1C in the last 72 hours. CBG: Recent Labs  Lab 02/08/19 0647 02/08/19 1149 02/08/19 1549 02/08/19 2224 02/09/19 0636  GLUCAP 93 184* 122* 160* 196*   Lipid Profile: No results for input(s): CHOL, HDL, LDLCALC, TRIG, CHOLHDL, LDLDIRECT in the last 72 hours. Thyroid Function Tests: No results for input(s): TSH, T4TOTAL, FREET4, T3FREE, THYROIDAB  in the last 72 hours. Anemia Panel: No results for input(s): VITAMINB12, FOLATE, FERRITIN, TIBC, IRON, RETICCTPCT in the last 72 hours. Sepsis Labs: No results for input(s): PROCALCITON, LATICACIDVEN in the last 168 hours.  Recent Results (from the past 240 hour(s))  Culture, blood (routine x 2)     Status: None   Collection Time: 02/02/19  5:11 PM   Specimen: BLOOD  Result Value Ref Range Status   Specimen Description BLOOD RIGHT ANTECUBITAL  Final   Special Requests   Final    BOTTLES DRAWN AEROBIC AND ANAEROBIC Blood Culture results may not be optimal due to an excessive volume of blood received in culture bottles   Culture   Final    NO GROWTH 5 DAYS Performed at [facility] [address], [place], [place] [address]    Report Status 02/07/2019 FINAL  Final  Culture, blood (routine x 2)     Status: None   Collection Time: 02/02/19  5:11 PM   Specimen: BLOOD  Result Value Ref Range Status   Specimen Description  BLOOD SITE NOT SPECIFIED  Final   Special Requests   Final    BOTTLES DRAWN AEROBIC AND ANAEROBIC Blood Culture results may not be optimal due to an excessive volume of blood received in culture bottles   Culture   Final    NO GROWTH 5 DAYS Performed at [facility], [place] [address], [place], [place] [address]    Report Status 02/07/2019 FINAL  Final  SARS CORONAVIRUS 2 (TAT 6-24 HRS) Nasopharyngeal Nasopharyngeal Swab     Status: None   Collection Time: 02/02/19  6:45 PM   Specimen: Nasopharyngeal Swab  Result Value Ref Range Status   SARS Coronavirus 2 NEGATIVE NEGATIVE Final    Comment: (NOTE) SARS-CoV-2 target nucleic acids are NOT DETECTED. The SARS-CoV-2 RNA is generally detectable in upper and lower respiratory specimens during the acute phase of infection. Negative results do not preclude SARS-CoV-2 infection, do not rule out co-infections with other pathogens, and should not be used as the sole basis for treatment or other patient management decisions. Negative results must be combined with clinical observations, patient history, and epidemiological information. The expected result is Negative. Fact Sheet for Patients: SugarRoll.be Fact Sheet for Healthcare Providers: https://www.woods-mathews.com/ This test is not yet approved or cleared by the [place] FDA and  has been authorized for detection and/or diagnosis of SARS-CoV-2 by FDA under an Emergency Use Authorization (EUA). This EUA will remain  in effect (meaning this test can be used) for the duration of the COVID-19 declaration under Section 56 4(b)(1) of the Act, 21 U.S.C. section 360bbb-3(b)(1), unless the authorization is terminated or revoked sooner. Performed at [facility], [place] [address], [place], [place] [address]   MRSA PCR Screening     Status: None   Collection Time: 02/03/19  6:30 AM   Specimen: Nasopharyngeal  Result Value Ref Range Status    MRSA by PCR NEGATIVE NEGATIVE Final    Comment:        The GeneXpert MRSA Assay (FDA approved for NASAL specimens only), is one component of a comprehensive MRSA colonization surveillance program. It is not intended to diagnose MRSA infection nor to guide or monitor treatment for MRSA infections. Performed at [facility], [place] [address] [place], [place] [address]   SARS CORONAVIRUS 2 (TAT 6-24 HRS) Nasopharyngeal Nasopharyngeal Swab     Status: None   Collection Time: 02/05/19  9:52 AM  Specimen: Nasopharyngeal Swab  Result Value Ref Range Status   SARS Coronavirus 2 NEGATIVE NEGATIVE Final    Comment: (NOTE) SARS-CoV-2 target nucleic acids are NOT DETECTED. The SARS-CoV-2 RNA is generally detectable in upper and lower respiratory specimens during the acute phase of infection. Negative results do not preclude SARS-CoV-2 infection, do not rule out co-infections with other pathogens, and should not be used as the sole basis for treatment or other patient management decisions. Negative results must be combined with clinical observations, patient history, and epidemiological information. The expected result is Negative. Fact Sheet for Patients: SugarRoll.be Fact Sheet for Healthcare Providers: https://www.woods-mathews.com/ This test is not yet approved or cleared by the [place] FDA and  has been authorized for detection and/or diagnosis of SARS-CoV-2 by FDA under an Emergency Use Authorization (EUA). This EUA will remain  in effect (meaning this test can be used) for the duration of the COVID-19 declaration under Section 56 4(b)(1) of the Act, 21 U.S.C. section 360bbb-3(b)(1), unless the authorization is terminated or revoked sooner. Performed at [facility], [place] [address], [place], [place] [address]   SARS CORONAVIRUS 2 (TAT 6-24 HRS) Nasopharyngeal Nasopharyngeal Swab     Status: None   Collection Time: 02/08/19   2:09 PM   Specimen: Nasopharyngeal Swab  Result Value Ref Range Status   SARS Coronavirus 2 NEGATIVE NEGATIVE Final    Comment: (NOTE) SARS-CoV-2 target nucleic acids are NOT DETECTED. The SARS-CoV-2 RNA is generally detectable in upper and lower respiratory specimens during the acute phase of infection. Negative results do not preclude SARS-CoV-2 infection, do not rule out co-infections with other pathogens, and should not be used as the sole basis for treatment or other patient management decisions. Negative results must be combined with clinical observations, patient history, and epidemiological information. The expected result is Negative. Fact Sheet for Patients: SugarRoll.be Fact Sheet for Healthcare Providers: https://www.woods-mathews.com/ This test is not yet approved or cleared by the [place] FDA and  has been authorized for detection and/or diagnosis of SARS-CoV-2 by FDA under an Emergency Use Authorization (EUA). This EUA will remain  in effect (meaning this test can be used) for the duration of the COVID-19 declaration under Section 56 4(b)(1) of the Act, 21 U.S.C. section 360bbb-3(b)(1), unless the authorization is terminated or revoked sooner. Performed at [facility], [place] [address], [place], [place] [address]          Radiology Studies: DG CHEST PORT 1 VIEW  Result Date: 02/08/2019 CLINICAL DATA:  Shortness of breath. EXAM: PORTABLE CHEST 1 VIEW COMPARISON:  12/06/2018 FINDINGS: Poor inspiration. Mild cardiomegaly. Aortic atherosclerosis. Possible pulmonary venous hypertension without frank edema, infiltrate, collapse or effusion. IMPRESSION: Mild cardiomegaly. Aortic atherosclerosis. Poor inspiration. Possible venous hypertension without frank edema. Electronically Signed   By: Nelson Chimes M.D.   On: 02/08/2019 14:09        Scheduled Meds: . allopurinol  100 mg Oral Daily  . aspirin EC  81 mg Oral QHS   . carvedilol  12.5 mg Oral BID WC  . colchicine  0.3 mg Oral Daily  . diclofenac sodium  2 g Topical QID  . famotidine  20 mg Oral BID  . ferrous sulfate  325 mg Oral Q breakfast  . fluticasone furoate-vilanterol  1 puff Inhalation Daily  . furosemide  80 mg Oral BID  . gabapentin  100 mg Oral TID  . heparin  5,000 Units Subcutaneous Q8H  . hydrocortisone cream   Topical BID  .  lactobacillus acidophilus  2 tablet Oral TID  . montelukast  10 mg Oral QHS  . Oxcarbazepine  300 mg Oral BID  . predniSONE  40 mg Oral QAC breakfast  . silver sulfADIAZINE  1 application Topical Daily  . simvastatin  40 mg Oral QHS   Continuous Infusions: . cefTRIAXone (ROCEPHIN)  IV 2 g (02/09/19 2044)     LOS: 8 days    Time spent: 35 minutes    Irine Seal, MD [facility]  If 7PM-7AM, please contact night-coverage [email or web address] 02/10/2019, 11:45 AM

## 2019-02-10 NOTE — Progress Notes (Signed)
Coughing episode/ crying - no asthma noted highly  Emotional- requested breathing tx- dou neb prn ordered per on call phys-instructed to sit up to aid in breathing she preferred to lay flat

## 2019-02-10 NOTE — TOC Progression Note (Signed)
Transition of Care [facility]) - Progression Note    Patient Details  Name: April [name] MRN: [id number] Date of Birth: 06-18-1950  Transition of [facility]) CM/SW [place], LCSW Phone Number: 02/10/2019, 10:17 AM  Clinical Narrative:    [place] working to find SNF placement for patient. Patient's insurance changed from [facility] mcr advantage to [facility] mcr advantage on 02/03/19. There has been some discrepancy about her 100 mcr days starting over.   CSW spoke with [facility] rep who states that she has 100 unused days. CSW has still not been able to secure placment for patient due to insurance and weight.   CSW will continue to work on this. TOC leadership has been informed of the above.    Expected Discharge Plan: [place] Barriers to Discharge: Continued Medical Work up  Expected Discharge Plan and Services Expected Discharge Plan: [place] In-house Referral: Clinical Social Work   Post Acute Care Choice: [place] Living arrangements for the past 2 months: Single Family Home                                       Social Determinants of Health (SDOH) Interventions    Readmission Risk Interventions No flowsheet data found.

## 2019-02-11 ENCOUNTER — Inpatient Hospital Stay (HOSPITAL_COMMUNITY): Payer: Medicare Other

## 2019-02-11 LAB — BASIC METABOLIC PANEL
Anion gap: 9 (ref 5–15)
BUN: 28 mg/dL — ABNORMAL HIGH (ref 8–23)
CO2: 32 mmol/L (ref 22–32)
Calcium: 8.6 mg/dL — ABNORMAL LOW (ref 8.9–10.3)
Chloride: 98 mmol/L (ref 98–111)
Creatinine, Ser: 1.32 mg/dL — ABNORMAL HIGH (ref 0.44–1.00)
GFR calc Af Amer: 48 mL/min — ABNORMAL LOW (ref >60)
GFR calc non Af Amer: 41 mL/min — ABNORMAL LOW (ref >60)
Glucose, Bld: 147 mg/dL — ABNORMAL HIGH (ref 70–99)
Potassium: 3.9 mmol/L (ref 3.5–5.1)
Sodium: 139 mmol/L (ref 135–145)

## 2019-02-11 LAB — GLUCOSE, CAPILLARY: Glucose-Capillary: 173 mg/dL — ABNORMAL HIGH (ref 70–99)

## 2019-02-11 LAB — MAGNESIUM: Magnesium: 2.2 mg/dL (ref 1.7–2.4)

## 2019-02-11 MED ORDER — BENZONATATE 100 MG PO CAPS
200.0000 mg | ORAL_CAPSULE | Freq: Three times a day (TID) | ORAL | Status: DC | PRN
  Administered 2019-02-11 – 2019-02-27 (×2): 200 mg via ORAL
  Filled 2019-02-11 (×3): qty 2

## 2019-02-11 NOTE — Progress Notes (Signed)
PROGRESS NOTE    April [name]  OVF:[id number] DOB: Oct 12, 1950 DOA: 02/02/2019 PCP: [name], MD    Brief Narrative:  69 year old female with history of hypertension, hyperlipidemia, diabetes mellitus type 2, neuropathy, chronic kidney disease stage III, CAD, asthma, morbid obesity, recent Covid infection in November requiring hospitalization and treatment with steroids/remdesivir, chronic right shin wound, chronic ambulatory dysfunction presented with worsening weakness along with new pain and rash around the right shin ulcer area along with painful blister on the bottom of the left foot.  She was started on antibiotics for cellulitis.    Assessment & Plan:   Active Problems:   Neuropathy   Cellulitis   Non-pressure chronic ulcer of left heel and midfoot limited to breakdown of skin (HCC)   Idiopathic chronic gout of multiple sites without tophus   SOB (shortness of breath)   Acute idiopathic gout of right foot   Morbid obesity (HCC)  1 cellulitis surrounding right chronic wound of the shin,POA/left plantar foot blister with possible cellulitis, POA Patient admitted with cellulitis and plantar foot blister and placed on empiric IV vancomycin and IV Rocephin.  Plain films of bilateral feet as well as right tib-fib showed no acute lesions except some soft tissue swelling of the left foot.  Orthopedics was consulted patient seen in consultation by orthopedics who recommended nonweightbearing for both extremities and to continue antibiotics and transition to oral antibiotics on discharge.  MRSA nasal PCR was negative.  Patient noted to be on day 10 of antibiotics.  IV vancomycin has been discontinued.  Discontinue IV Rocephin after today's dose.  Follow.   2.  Probable acute gout flare Patient with some exquisite tenderness to palpation on the right heel.  Patient with a history of uncontrolled gout per orthopedics.  Uric acid level was ordered which was elevated at 11.1.  Status  post Solu-Medrol 60 mg IV x1 on 02/08/2019.  Continue oral prednisone 40 mg daily to complete a 5-day course of treatment.  Post flare will likely need to be started on allopurinol however will defer to outpatient setting.  3.  Acute on chronic ambulatory dysfunction/generalized deconditioning PT recommended SNF.  Social work consulted.  4.  Acute kidney injury on chronic kidney disease stage III Baseline creatinine fluctuates between 1.2-1.5.  Currently stable.  Patient's diuretics resumed on 02/05/2019 at a lower dose of 80 mg twice daily and dose increased back to home regimen of 420 mg twice daily.  Spironolactone resumed.  Renal function improved and creatinine currently at 1.32.  Potassium at 3.9. Follow.   5.  Chronic neuropathy Continue gabapentin which is renally dosed.  6.  Hypertension Blood pressure was borderline and improved.  Continue Coreg, home dose Lasix, spironolactone.  Follow.   7.  Asthma/COPD Patient with some complaints of shortness of breath.  Check a chest x-ray.  Follow.  8.  Morbid obesity     DVT prophylaxis: Heparin Code Status: Full Family Communication: Updated patient.  No family at bedside. Disposition Plan: SNF when bed available.   Consultants:   Orthopedics: Dr. [name] 02/04/2019  Procedures:   Chest x-ray pending 02/08/2019  Plain films of bilateral feet 02/03/2019  Plain films of the right tib-fib 02/03/2019  Antimicrobials:  IV Rocephin 02/02/2019  IV vancomycin 02/03/2019>>>> 02/08/2019   Subjective: Patient complaining of pain all over.  Patient states some shortness of breath.  No chest pain.  Complaining of left knee pain.   Objective: Vitals:   02/11/19 0500 02/11/19 0501 02/11/19 8416  02/11/19 0913  BP:  113/71  (!) 114/53  Pulse:  75  75  Resp:    18  Temp:  98.5 F (36.9 C)  98.6 F (37 C)  TempSrc:  Oral  Oral  SpO2:  96% 97% 96%  Weight: (!) 155.7 kg     Height:        Intake/Output Summary (Last 24 hours) at 02/11/2019  1159 Last data filed at 02/11/2019 0700 Gross per 24 hour  Intake 240 ml  Output 1600 ml  Net -1360 ml   Filed Weights   02/05/19 1730 02/10/19 0500 02/11/19 0500  Weight: (!) 157 kg (!) 164.8 kg (!) 155.7 kg    Examination:  General exam: NAD.  Respiratory system: Decreased scattered crackles diffusely.  No wheezing.  No rhonchi.  Normal respiratory effort.  Cardiovascular system: Regular rate and rhythm no murmurs rubs or gallops.  No JVD.  No lower extremity edema.  Gastrointestinal system: Abdomen is nontender, nondistended, soft, positive bowel sounds.  No rebound.  No guarding.  Central nervous system: Alert and oriented. No focal neurological deficits. Extremities: Blister noted on plantar surface of left foot.  Right lower extremity wrapped.  Patient with less exquisite tenderness to palpation right heel.  Skin: Blister noted on plantar surface of left foot.  Psychiatry: Judgement and insight appear normal. Mood & affect appropriate.     Data Reviewed: I have personally reviewed following labs and imaging studies  CBC: Recent Labs  Lab 02/05/19 0332 02/08/19 0328 02/09/19 0445 02/10/19 0134  WBC 4.4 4.7 3.5* 6.5  NEUTROABS 2.0 2.1 3.0  --   HGB 10.1* 11.2* 11.4* 11.2*  HCT 33.8* 35.7* 36.5 35.2*  MCV 97.1 95.7 95.1 95.1  PLT 202 206 239 557   Basic Metabolic Panel: Recent Labs  Lab 02/07/19 0644 02/08/19 0328 02/09/19 0445 02/10/19 0134 02/11/19 0518  NA 139 138 138 138 139  K 3.9 3.9 4.4 4.1 3.9  CL 101 97* 96* 97* 98  CO2 30 30 27 29  32  GLUCOSE 95 115* 226* 144* 147*  BUN 18 20 25* 26* 28*  CREATININE 1.19* 1.27* 1.14* 1.10* 1.32*  CALCIUM 8.4* 8.6* 8.9 8.9 8.6*  MG 2.0 2.0 2.0 2.2 2.2   GFR: Estimated Creatinine Clearance: 59.4 mL/min (A) (by C-G formula based on SCr of 1.32 mg/dL (H)). Liver Function Tests: No results for input(s): AST, ALT, ALKPHOS, BILITOT, PROT, ALBUMIN in the last 168 hours. No results for input(s): LIPASE, AMYLASE in the  last 168 hours. No results for input(s): AMMONIA in the last 168 hours. Coagulation Profile: No results for input(s): INR, PROTIME in the last 168 hours. Cardiac Enzymes: No results for input(s): CKTOTAL, CKMB, CKMBINDEX, TROPONINI in the last 168 hours. BNP (last 3 results) No results for input(s): PROBNP in the last 8760 hours. HbA1C: No results for input(s): HGBA1C in the last 72 hours. CBG: Recent Labs  Lab 02/08/19 1149 02/08/19 1549 02/08/19 2224 02/09/19 0636 02/11/19 1138  GLUCAP 184* 122* 160* 196* 173*   Lipid Profile: No results for input(s): CHOL, HDL, LDLCALC, TRIG, CHOLHDL, LDLDIRECT in the last 72 hours. Thyroid Function Tests: No results for input(s): TSH, T4TOTAL, FREET4, T3FREE, THYROIDAB in the last 72 hours. Anemia Panel: No results for input(s): VITAMINB12, FOLATE, FERRITIN, TIBC, IRON, RETICCTPCT in the last 72 hours. Sepsis Labs: No results for input(s): PROCALCITON, LATICACIDVEN in the last 168 hours.  Recent Results (from the past 240 hour(s))  Culture, blood (routine x 2)  Status: None   Collection Time: 02/02/19  5:11 PM   Specimen: BLOOD  Result Value Ref Range Status   Specimen Description BLOOD RIGHT ANTECUBITAL  Final   Special Requests   Final    BOTTLES DRAWN AEROBIC AND ANAEROBIC Blood Culture results may not be optimal due to an excessive volume of blood received in culture bottles   Culture   Final    NO GROWTH 5 DAYS Performed at [facility], [place] [address] [place], [place] [address]    Report Status 02/07/2019 FINAL  Final  Culture, blood (routine x 2)     Status: None   Collection Time: 02/02/19  5:11 PM   Specimen: BLOOD  Result Value Ref Range Status   Specimen Description BLOOD SITE NOT SPECIFIED  Final   Special Requests   Final    BOTTLES DRAWN AEROBIC AND ANAEROBIC Blood Culture results may not be optimal due to an excessive volume of blood received in culture bottles   Culture   Final    NO GROWTH 5  DAYS Performed at [facility], [place] [address] [place], [place] [address]    Report Status 02/07/2019 FINAL  Final  SARS CORONAVIRUS 2 (TAT 6-24 HRS) Nasopharyngeal Nasopharyngeal Swab     Status: None   Collection Time: 02/02/19  6:45 PM   Specimen: Nasopharyngeal Swab  Result Value Ref Range Status   SARS Coronavirus 2 NEGATIVE NEGATIVE Final    Comment: (NOTE) SARS-CoV-2 target nucleic acids are NOT DETECTED. The SARS-CoV-2 RNA is generally detectable in upper and lower respiratory specimens during the acute phase of infection. Negative results do not preclude SARS-CoV-2 infection, do not rule out co-infections with other pathogens, and should not be used as the sole basis for treatment or other patient management decisions. Negative results must be combined with clinical observations, patient history, and epidemiological information. The expected result is Negative. Fact Sheet for Patients: SugarRoll.be Fact Sheet for Healthcare Providers: https://www.woods-mathews.com/ This test is not yet approved or cleared by the [place] FDA and  has been authorized for detection and/or diagnosis of SARS-CoV-2 by FDA under an Emergency Use Authorization (EUA). This EUA will remain  in effect (meaning this test can be used) for the duration of the COVID-19 declaration under Section 56 4(b)(1) of the Act, 21 U.S.C. section 360bbb-3(b)(1), unless the authorization is terminated or revoked sooner. Performed at [facility], [place] [address], [place], [place] [address]   MRSA PCR Screening     Status: None   Collection Time: 02/03/19  6:30 AM   Specimen: Nasopharyngeal  Result Value Ref Range Status   MRSA by PCR NEGATIVE NEGATIVE Final    Comment:        The GeneXpert MRSA Assay (FDA approved for NASAL specimens only), is one component of a comprehensive MRSA colonization surveillance program. It is not intended to diagnose  MRSA infection nor to guide or monitor treatment for MRSA infections. Performed at [facility], [place] [address] [place], [place] [address]   SARS CORONAVIRUS 2 (TAT 6-24 HRS) Nasopharyngeal Nasopharyngeal Swab     Status: None   Collection Time: 02/05/19  9:52 AM   Specimen: Nasopharyngeal Swab  Result Value Ref Range Status   SARS Coronavirus 2 NEGATIVE NEGATIVE Final    Comment: (NOTE) SARS-CoV-2 target nucleic acids are NOT DETECTED. The SARS-CoV-2 RNA is generally detectable in upper and lower respiratory specimens during the acute phase of infection. Negative results do not  preclude SARS-CoV-2 infection, do not rule out co-infections with other pathogens, and should not be used as the sole basis for treatment or other patient management decisions. Negative results must be combined with clinical observations, patient history, and epidemiological information. The expected result is Negative. Fact Sheet for Patients: SugarRoll.be Fact Sheet for Healthcare Providers: https://www.woods-mathews.com/ This test is not yet approved or cleared by the [place] FDA and  has been authorized for detection and/or diagnosis of SARS-CoV-2 by FDA under an Emergency Use Authorization (EUA). This EUA will remain  in effect (meaning this test can be used) for the duration of the COVID-19 declaration under Section 56 4(b)(1) of the Act, 21 U.S.C. section 360bbb-3(b)(1), unless the authorization is terminated or revoked sooner. Performed at [facility], [place] [address] [place], [place] [address]   SARS CORONAVIRUS 2 (TAT 6-24 HRS) Nasopharyngeal Nasopharyngeal Swab     Status: None   Collection Time: 02/08/19  2:09 PM   Specimen: Nasopharyngeal Swab  Result Value Ref Range Status   SARS Coronavirus 2 NEGATIVE NEGATIVE Final    Comment: (NOTE) SARS-CoV-2 target nucleic acids are NOT DETECTED. The SARS-CoV-2 RNA is generally detectable  in upper and lower respiratory specimens during the acute phase of infection. Negative results do not preclude SARS-CoV-2 infection, do not rule out co-infections with other pathogens, and should not be used as the sole basis for treatment or other patient management decisions. Negative results must be combined with clinical observations, patient history, and epidemiological information. The expected result is Negative. Fact Sheet for Patients: SugarRoll.be Fact Sheet for Healthcare Providers: https://www.woods-mathews.com/ This test is not yet approved or cleared by the [place] FDA and  has been authorized for detection and/or diagnosis of SARS-CoV-2 by FDA under an Emergency Use Authorization (EUA). This EUA will remain  in effect (meaning this test can be used) for the duration of the COVID-19 declaration under Section 56 4(b)(1) of the Act, 21 U.S.C. section 360bbb-3(b)(1), unless the authorization is terminated or revoked sooner. Performed at [facility], [place] [address] [place], [place] [address]          Radiology Studies: No results found.      Scheduled Meds: . allopurinol  100 mg Oral Daily  . aspirin EC  81 mg Oral QHS  . carvedilol  12.5 mg Oral BID WC  . colchicine  0.3 mg Oral Daily  . diclofenac sodium  2 g Topical QID  . famotidine  20 mg Oral BID  . ferrous sulfate  325 mg Oral Q breakfast  . fluticasone furoate-vilanterol  1 puff Inhalation Daily  . furosemide  120 mg Oral BID  . gabapentin  100 mg Oral TID  . heparin  5,000 Units Subcutaneous Q8H  . hydrocortisone cream   Topical BID  . lactobacillus acidophilus  2 tablet Oral TID  . montelukast  10 mg Oral QHS  . Oxcarbazepine  300 mg Oral BID  . predniSONE  40 mg Oral QAC breakfast  . silver sulfADIAZINE  1 application Topical Daily  . simvastatin  40 mg Oral QHS  . spironolactone  25 mg Oral Daily   Continuous Infusions: . cefTRIAXone  (ROCEPHIN)  IV 2 g (02/10/19 2120)     LOS: 9 days    Time spent: 35 minutes    Irine Seal, MD [facility]  If 7PM-7AM, please contact night-coverage [email or web address] 02/11/2019, 11:59 AM

## 2019-02-11 NOTE — Plan of Care (Signed)

## 2019-02-11 NOTE — Plan of Care (Signed)
  Problem: Education: Goal: Knowledge of General Education information will improve Description: Including pain rating scale, medication(s)/side effects and non-pharmacologic comfort measures Outcome: Progressing   Problem: Health Behavior/Discharge Planning: Goal: Ability to manage health-related needs will improve Outcome: Progressing   Problem: Clinical Measurements: Goal: Ability to maintain clinical measurements within normal limits will improve Outcome: Progressing   Problem: Activity: Goal: Risk for activity intolerance will decrease Outcome: Progressing   Problem: Coping: Goal: Level of anxiety will decrease Outcome: Progressing   Problem: Elimination: Goal: Will not experience complications related to bowel motility Outcome: Progressing Goal: Will not experience complications related to urinary retention Outcome: Progressing   Problem: Pain Managment: Goal: General experience of comfort will improve Outcome: Progressing   Problem: Safety: Goal: Ability to remain free from injury will improve Outcome: Progressing   Problem: Skin Integrity: Goal: Risk for impaired skin integrity will decrease Outcome: Progressing

## 2019-02-12 LAB — BASIC METABOLIC PANEL
Anion gap: 5 (ref 5–15)
BUN: 28 mg/dL — ABNORMAL HIGH (ref 8–23)
CO2: 35 mmol/L — ABNORMAL HIGH (ref 22–32)
Calcium: 8.6 mg/dL — ABNORMAL LOW (ref 8.9–10.3)
Chloride: 96 mmol/L — ABNORMAL LOW (ref 98–111)
Creatinine, Ser: 1.33 mg/dL — ABNORMAL HIGH (ref 0.44–1.00)
GFR calc Af Amer: 47 mL/min — ABNORMAL LOW (ref >60)
GFR calc non Af Amer: 41 mL/min — ABNORMAL LOW (ref >60)
Glucose, Bld: 149 mg/dL — ABNORMAL HIGH (ref 70–99)
Potassium: 4 mmol/L (ref 3.5–5.1)
Sodium: 136 mmol/L (ref 135–145)

## 2019-02-12 LAB — CBC
HCT: 34.7 % — ABNORMAL LOW (ref 36.0–46.0)
Hemoglobin: 10.9 g/dL — ABNORMAL LOW (ref 12.0–15.0)
MCH: 29.8 pg (ref 26.0–34.0)
MCHC: 31.4 g/dL (ref 30.0–36.0)
MCV: 94.8 fL (ref 80.0–100.0)
Platelets: 235 10*3/uL (ref 150–400)
RBC: 3.66 MIL/uL — ABNORMAL LOW (ref 3.87–5.11)
RDW: 15.9 % — ABNORMAL HIGH (ref 11.5–15.5)
WBC: 7.5 10*3/uL (ref 4.0–10.5)
nRBC: 0.3 % — ABNORMAL HIGH (ref 0.0–0.2)

## 2019-02-12 LAB — MAGNESIUM: Magnesium: 2.3 mg/dL (ref 1.7–2.4)

## 2019-02-12 NOTE — Progress Notes (Signed)
PROGRESS NOTE    April [name]  IRJ:[id number] DOB: 1950/03/19 DOA: 02/02/2019 PCP: [name], MD    Brief Narrative:  69 year old female with history of hypertension, hyperlipidemia, diabetes mellitus type 2, neuropathy, chronic kidney disease stage III, CAD, asthma, morbid obesity, recent Covid infection in November requiring hospitalization and treatment with steroids/remdesivir, chronic right shin wound, chronic ambulatory dysfunction presented with worsening weakness along with new pain and rash around the right shin ulcer area along with painful blister on the bottom of the left foot.  She was started on antibiotics for cellulitis.    Assessment & Plan:   Active Problems:   Neuropathy   Cellulitis   Non-pressure chronic ulcer of left heel and midfoot limited to breakdown of skin (HCC)   Idiopathic chronic gout of multiple sites without tophus   SOB (shortness of breath)   Acute idiopathic gout of right foot   Morbid obesity (HCC)  1 cellulitis surrounding right chronic wound of the shin,POA/left plantar foot blister with possible cellulitis, POA Patient admitted with cellulitis and plantar foot blister and placed on empiric IV vancomycin and IV Rocephin.  Plain films of bilateral feet as well as right tib-fib showed no acute lesions except some soft tissue swelling of the left foot.  Orthopedics was consulted patient seen in consultation by orthopedics who recommended nonweightbearing for both extremities and to continue antibiotics and transition to oral antibiotics on discharge.  MRSA nasal PCR was negative.  Patient status post 10-day antibiotics.  Improved clinically.  IV vancomycin IV Rocephin have been discontinued.  No further antibiotics needed at this time.  2.  Probable acute gout flare Patient with some exquisite tenderness to palpation on the right heel.  Patient with a history of uncontrolled gout per orthopedics.  Uric acid level was ordered which was elevated  at 11.1.  Status post Solu-Medrol 60 mg IV x1 on 02/08/2019.  Continue oral prednisone 40 mg daily to complete a 5-day course of treatment.  Post flare will likely need to be started on allopurinol however will defer to outpatient setting.  3.  Acute on chronic ambulatory dysfunction/generalized deconditioning PT recommended SNF.  Social work consulted.  4.  Acute kidney injury on chronic kidney disease stage III Baseline creatinine fluctuates between 1.2-1.5.  Currently stable.  Patient's diuretics resumed on 02/05/2019 at a lower dose of 80 mg twice daily and dose increased back to home regimen of 120 mg twice daily.  Spironolactone resumed.  Renal function improved and creatinine currently at 1.33.  Potassium at 4. Follow.   5.  Chronic neuropathy Continue gabapentin which is renally dosed.  6.  Hypertension Blood pressure was borderline and improved.  Continue Coreg, home dose Lasix, spironolactone.  Follow.   7.  Asthma/COPD Patient with some complaints of shortness of breath.  Improving with diuresis.  Chest x-ray with bibasilar ill-defined airspace opacity likely atypical organism pneumonia.  Lung sounds were clear.  Patient recently treated for COVID-19 in November 2020 and likely chest x-ray findings from then.  Patient is afebrile.  Patient is not hypoxic.  Patient with a normal white count.  No need for antibiotics at this time.  Supportive care.  Follow.  8.  Morbid obesity     DVT prophylaxis: Heparin Code Status: Full Family Communication: Updated patient.  No family at bedside. Disposition Plan: SNF when bed available.   Consultants:   Orthopedics: Dr. [name] 02/04/2019  Procedures:   Chest x-ray pending 02/08/2019  Plain films of bilateral feet  02/03/2019  Plain films of the right tib-fib 02/03/2019  Antimicrobials:  IV Rocephin 02/02/2019>>>> 02/12/2019  IV vancomycin 02/03/2019>>>> 02/08/2019   Subjective: Patient still with complaints of heel pain.  Patient states some  improvement with shortness of breath.  Still with cough but slowly improving.  No chest pain.  Still complaining of left heel pain.  Objective: Vitals:   02/11/19 1429 02/11/19 1922 02/12/19 0415 02/12/19 0742  BP: 109/69 (!) 117/55 135/65 127/60  Pulse: 69 72 73 67  Resp: 18   18  Temp: (!) 97.5 F (36.4 C) 99 F (37.2 C) 98.6 F (37 C) 99 F (37.2 C)  TempSrc: Oral Oral Oral Oral  SpO2: 97% 96% 93% 94%  Weight:      Height:        Intake/Output Summary (Last 24 hours) at 02/12/2019 1351 Last data filed at 02/12/2019 0900 Gross per 24 hour  Intake 360 ml  Output 1450 ml  Net -1090 ml   Filed Weights   02/05/19 1730 02/10/19 0500 02/11/19 0500  Weight: (!) 157 kg (!) 164.8 kg (!) 155.7 kg    Examination:  General exam: NAD.  Respiratory system: Decreasing scattered crackles.  No wheezing.  No rhonchi.  Normal respiratory effort.  Cardiovascular system: RRR no murmurs rubs or gallops.  No JVD.  No lower extremity edema. Gastrointestinal system: Abdomen is soft, nontender, nondistended, positive bowel sounds.  No rebound.  No guarding.   Central nervous system: Alert and oriented. No focal neurological deficits. Extremities: Blister ruptured, noted on plantar surface of left foot.  Right lower extremity wrapped.  Patient with less exquisite tenderness to palpation right heel.  Cellulitis improved. Skin: Blister noted on plantar surface of left foot.  Psychiatry: Judgement and insight appear normal. Mood & affect appropriate.     Data Reviewed: I have personally reviewed following labs and imaging studies  CBC: Recent Labs  Lab 02/08/19 0328 02/09/19 0445 02/10/19 0134 02/12/19 0257  WBC 4.7 3.5* 6.5 7.5  NEUTROABS 2.1 3.0  --   --   HGB 11.2* 11.4* 11.2* 10.9*  HCT 35.7* 36.5 35.2* 34.7*  MCV 95.7 95.1 95.1 94.8  PLT 206 239 231 505   Basic Metabolic Panel: Recent Labs  Lab 02/08/19 0328 02/09/19 0445 02/10/19 0134 02/11/19 0518 02/12/19 0257  NA 138  138 138 139 136  K 3.9 4.4 4.1 3.9 4.0  CL 97* 96* 97* 98 96*  CO2 30 27 29  32 35*  GLUCOSE 115* 226* 144* 147* 149*  BUN 20 25* 26* 28* 28*  CREATININE 1.27* 1.14* 1.10* 1.32* 1.33*  CALCIUM 8.6* 8.9 8.9 8.6* 8.6*  MG 2.0 2.0 2.2 2.2 2.3   GFR: Estimated Creatinine Clearance: 59 mL/min (A) (by C-G formula based on SCr of 1.33 mg/dL (H)). Liver Function Tests: No results for input(s): AST, ALT, ALKPHOS, BILITOT, PROT, ALBUMIN in the last 168 hours. No results for input(s): LIPASE, AMYLASE in the last 168 hours. No results for input(s): AMMONIA in the last 168 hours. Coagulation Profile: No results for input(s): INR, PROTIME in the last 168 hours. Cardiac Enzymes: No results for input(s): CKTOTAL, CKMB, CKMBINDEX, TROPONINI in the last 168 hours. BNP (last 3 results) No results for input(s): PROBNP in the last 8760 hours. HbA1C: No results for input(s): HGBA1C in the last 72 hours. CBG: Recent Labs  Lab 02/08/19 1149 02/08/19 1549 02/08/19 2224 02/09/19 0636 02/11/19 1138  GLUCAP 184* 122* 160* 196* 173*   Lipid Profile: No results for input(s):  CHOL, HDL, LDLCALC, TRIG, CHOLHDL, LDLDIRECT in the last 72 hours. Thyroid Function Tests: No results for input(s): TSH, T4TOTAL, FREET4, T3FREE, THYROIDAB in the last 72 hours. Anemia Panel: No results for input(s): VITAMINB12, FOLATE, FERRITIN, TIBC, IRON, RETICCTPCT in the last 72 hours. Sepsis Labs: No results for input(s): PROCALCITON, LATICACIDVEN in the last 168 hours.  Recent Results (from the past 240 hour(s))  Culture, blood (routine x 2)     Status: None   Collection Time: 02/02/19  5:11 PM   Specimen: BLOOD  Result Value Ref Range Status   Specimen Description BLOOD RIGHT ANTECUBITAL  Final   Special Requests   Final    BOTTLES DRAWN AEROBIC AND ANAEROBIC Blood Culture results may not be optimal due to an excessive volume of blood received in culture bottles   Culture   Final    NO GROWTH 5 DAYS Performed at  [facility] [address], [place], [place] [address]    Report Status 02/07/2019 FINAL  Final  Culture, blood (routine x 2)     Status: None   Collection Time: 02/02/19  5:11 PM   Specimen: BLOOD  Result Value Ref Range Status   Specimen Description BLOOD SITE NOT SPECIFIED  Final   Special Requests   Final    BOTTLES DRAWN AEROBIC AND ANAEROBIC Blood Culture results may not be optimal due to an excessive volume of blood received in culture bottles   Culture   Final    NO GROWTH 5 DAYS Performed at [facility], [place] [address], [place], [place] [address]    Report Status 02/07/2019 FINAL  Final  SARS CORONAVIRUS 2 (TAT 6-24 HRS) Nasopharyngeal Nasopharyngeal Swab     Status: None   Collection Time: 02/02/19  6:45 PM   Specimen: Nasopharyngeal Swab  Result Value Ref Range Status   SARS Coronavirus 2 NEGATIVE NEGATIVE Final    Comment: (NOTE) SARS-CoV-2 target nucleic acids are NOT DETECTED. The SARS-CoV-2 RNA is generally detectable in upper and lower respiratory specimens during the acute phase of infection. Negative results do not preclude SARS-CoV-2 infection, do not rule out co-infections with other pathogens, and should not be used as the sole basis for treatment or other patient management decisions. Negative results must be combined with clinical observations, patient history, and epidemiological information. The expected result is Negative. Fact Sheet for Patients: SugarRoll.be Fact Sheet for Healthcare Providers: https://www.woods-mathews.com/ This test is not yet approved or cleared by the [place] FDA and  has been authorized for detection and/or diagnosis of SARS-CoV-2 by FDA under an Emergency Use Authorization (EUA). This EUA will remain  in effect (meaning this test can be used) for the duration of the COVID-19 declaration under Section 56 4(b)(1) of the Act, 21 U.S.C. section 360bbb-3(b)(1), unless the  authorization is terminated or revoked sooner. Performed at [facility], [place] [address], [place], [place] [address]   MRSA PCR Screening     Status: None   Collection Time: 02/03/19  6:30 AM   Specimen: Nasopharyngeal  Result Value Ref Range Status   MRSA by PCR NEGATIVE NEGATIVE Final    Comment:        The GeneXpert MRSA Assay (FDA approved for NASAL specimens only), is one component of a comprehensive MRSA colonization surveillance program. It is not intended to diagnose MRSA infection nor to guide or monitor treatment for MRSA infections. Performed at [facility], [place] [address], [place], [place] [address]  SARS CORONAVIRUS 2 (TAT 6-24 HRS) Nasopharyngeal Nasopharyngeal Swab     Status: None   Collection Time: 02/05/19  9:52 AM   Specimen: Nasopharyngeal Swab  Result Value Ref Range Status   SARS Coronavirus 2 NEGATIVE NEGATIVE Final    Comment: (NOTE) SARS-CoV-2 target nucleic acids are NOT DETECTED. The SARS-CoV-2 RNA is generally detectable in upper and lower respiratory specimens during the acute phase of infection. Negative results do not preclude SARS-CoV-2 infection, do not rule out co-infections with other pathogens, and should not be used as the sole basis for treatment or other patient management decisions. Negative results must be combined with clinical observations, patient history, and epidemiological information. The expected result is Negative. Fact Sheet for Patients: SugarRoll.be Fact Sheet for Healthcare Providers: https://www.woods-mathews.com/ This test is not yet approved or cleared by the [place] FDA and  has been authorized for detection and/or diagnosis of SARS-CoV-2 by FDA under an Emergency Use Authorization (EUA). This EUA will remain  in effect (meaning this test can be used) for the duration of the COVID-19 declaration under Section 56 4(b)(1) of the Act, 21 U.S.C. section  360bbb-3(b)(1), unless the authorization is terminated or revoked sooner. Performed at [facility], [place] [address] [place], [place] [address]   SARS CORONAVIRUS 2 (TAT 6-24 HRS) Nasopharyngeal Nasopharyngeal Swab     Status: None   Collection Time: 02/08/19  2:09 PM   Specimen: Nasopharyngeal Swab  Result Value Ref Range Status   SARS Coronavirus 2 NEGATIVE NEGATIVE Final    Comment: (NOTE) SARS-CoV-2 target nucleic acids are NOT DETECTED. The SARS-CoV-2 RNA is generally detectable in upper and lower respiratory specimens during the acute phase of infection. Negative results do not preclude SARS-CoV-2 infection, do not rule out co-infections with other pathogens, and should not be used as the sole basis for treatment or other patient management decisions. Negative results must be combined with clinical observations, patient history, and epidemiological information. The expected result is Negative. Fact Sheet for Patients: SugarRoll.be Fact Sheet for Healthcare Providers: https://www.woods-mathews.com/ This test is not yet approved or cleared by the [place] FDA and  has been authorized for detection and/or diagnosis of SARS-CoV-2 by FDA under an Emergency Use Authorization (EUA). This EUA will remain  in effect (meaning this test can be used) for the duration of the COVID-19 declaration under Section 56 4(b)(1) of the Act, 21 U.S.C. section 360bbb-3(b)(1), unless the authorization is terminated or revoked sooner. Performed at [facility], [place] [address] [place], [place] [address]          Radiology Studies: DG CHEST PORT 1 VIEW  Result Date: 02/11/2019 CLINICAL DATA:  Cough.  COVID-19 positive EXAM: PORTABLE CHEST 1 VIEW COMPARISON:  February 08, 2019 FINDINGS: There is ill-defined patchy opacity in the lung bases. Lungs elsewhere clear. Heart is upper normal in size with pulmonary vascularity normal. There is aortic  atherosclerosis. No adenopathy. No bone lesions. IMPRESSION: Bibasilar ill-defined airspace opacity, likely atypical organism pneumonia. Lungs elsewhere clear. Heart upper normal in size. No adenopathy. Aortic Atherosclerosis (ICD10-I70.0). Electronically Signed   By: Lowella Grip III M.D.   On: 02/11/2019 14:17        Scheduled Meds: . allopurinol  100 mg Oral Daily  . aspirin EC  81 mg Oral QHS  . carvedilol  12.5 mg Oral BID WC  . colchicine  0.3 mg Oral Daily  . diclofenac sodium  2 g Topical QID  . famotidine  20 mg Oral  BID  . ferrous sulfate  325 mg Oral Q breakfast  . fluticasone furoate-vilanterol  1 puff Inhalation Daily  . furosemide  120 mg Oral BID  . gabapentin  100 mg Oral TID  . heparin  5,000 Units Subcutaneous Q8H  . hydrocortisone cream   Topical BID  . lactobacillus acidophilus  2 tablet Oral TID  . montelukast  10 mg Oral QHS  . Oxcarbazepine  300 mg Oral BID  . silver sulfADIAZINE  1 application Topical Daily  . simvastatin  40 mg Oral QHS  . spironolactone  25 mg Oral Daily   Continuous Infusions:    LOS: 10 days    Time spent: 35 minutes    Irine Seal, MD [facility]  If 7PM-7AM, please contact night-coverage [email or web address] 02/12/2019, 1:51 PM

## 2019-02-12 NOTE — Plan of Care (Signed)

## 2019-02-13 LAB — BASIC METABOLIC PANEL
Anion gap: 9 (ref 5–15)
BUN: 31 mg/dL — ABNORMAL HIGH (ref 8–23)
CO2: 33 mmol/L — ABNORMAL HIGH (ref 22–32)
Calcium: 9 mg/dL (ref 8.9–10.3)
Chloride: 94 mmol/L — ABNORMAL LOW (ref 98–111)
Creatinine, Ser: 1.25 mg/dL — ABNORMAL HIGH (ref 0.44–1.00)
GFR calc Af Amer: 51 mL/min — ABNORMAL LOW (ref >60)
GFR calc non Af Amer: 44 mL/min — ABNORMAL LOW (ref >60)
Glucose, Bld: 132 mg/dL — ABNORMAL HIGH (ref 70–99)
Potassium: 4.2 mmol/L (ref 3.5–5.1)
Sodium: 136 mmol/L (ref 135–145)

## 2019-02-13 LAB — MAGNESIUM: Magnesium: 2.2 mg/dL (ref 1.7–2.4)

## 2019-02-13 LAB — HEMOGLOBIN AND HEMATOCRIT, BLOOD
HCT: 36.3 % (ref 36.0–46.0)
Hemoglobin: 11.3 g/dL — ABNORMAL LOW (ref 12.0–15.0)

## 2019-02-13 MED ORDER — MENTHOL 3 MG MT LOZG
1.0000 | LOZENGE | OROMUCOSAL | Status: DC | PRN
  Administered 2019-02-13: 14:00:00 3 mg via ORAL
  Filled 2019-02-13: qty 9

## 2019-02-13 NOTE — Progress Notes (Signed)
PROGRESS NOTE    April [name]  OBS:[id number] DOB: 11/08/1950 DOA: 02/02/2019 PCP: [name], MD    Brief Narrative:  69 year old female with history of hypertension, hyperlipidemia, diabetes mellitus type 2, neuropathy, chronic kidney disease stage III, CAD, asthma, morbid obesity, recent Covid infection in November requiring hospitalization and treatment with steroids/remdesivir, chronic right shin wound, chronic ambulatory dysfunction presented with worsening weakness along with new pain and rash around the right shin ulcer area along with painful blister on the bottom of the left foot.  She was started on antibiotics for cellulitis.    Assessment & Plan:   Active Problems:   Neuropathy   Cellulitis   Non-pressure chronic ulcer of left heel and midfoot limited to breakdown of skin (HCC)   Idiopathic chronic gout of multiple sites without tophus   SOB (shortness of breath)   Acute idiopathic gout of right foot   Morbid obesity (HCC)  1 cellulitis surrounding right chronic wound of the shin,POA/left plantar foot blister with possible cellulitis, POA Patient admitted with cellulitis and plantar foot blister and placed on empiric IV vancomycin and IV Rocephin.  Plain films of bilateral feet as well as right tib-fib showed no acute lesions except some soft tissue swelling of the left foot.  Orthopedics was consulted patient seen in consultation by orthopedics who recommended nonweightbearing for both extremities and to continue antibiotics and transition to oral antibiotics on discharge.  MRSA nasal PCR was negative.  Patient status post 10-day antibiotics.  Improved clinically.  IV vancomycin IV Rocephin have been discontinued.  No further antibiotics needed at this time.  2.  Probable acute gout flare Patient with some exquisite tenderness to palpation on the right heel.  Patient with a history of uncontrolled gout per orthopedics.  Uric acid level was ordered which was elevated  at 11.1.  Status post Solu-Medrol 60 mg IV x1 on 02/08/2019.  Continue oral prednisone 40 mg daily to complete a 5-day course of treatment.  Post flare will likely need to be started on allopurinol however will defer to outpatient setting.  3.  Acute on chronic ambulatory dysfunction/generalized deconditioning PT recommended SNF.  Social work consulted.  4.  Acute kidney injury on chronic kidney disease stage III Baseline creatinine fluctuates between 1.2-1.5. Patient's diuretics resumed on 02/05/2019 at a lower dose of 80 mg twice daily and dose increased back to home regimen of 120 mg twice daily.  Spironolactone resumed.  Renal function improved and creatinine currently at 1.25.  Potassium at 4. Follow.   5.  Chronic neuropathy Continue gabapentin which is renally dosed.  6.  Hypertension Blood pressure was borderline and improved.  Continue Coreg, home dose Lasix, spironolactone.  Follow.   7.  Asthma/COPD Patient with some complaints of shortness of breath which has improved with diuresis. Chest x-ray with bibasilar ill-defined airspace opacity likely atypical organism pneumonia.  Lung sounds were clear.  Patient recently treated for COVID-19 in November 2020 and likely chest x-ray findings from prior COVID-19 infection..  Patient is afebrile.  Patient is not hypoxic.  Patient with a normal white count.  No need for further antibiotics at this time.  Supportive care.  Follow.  8.  Morbid obesity     DVT prophylaxis: Heparin Code Status: Full Family Communication: Updated patient.  No family at bedside. Disposition Plan: SNF when bed available.   Consultants:   Orthopedics: Dr. [name] 02/04/2019  Procedures:   Chest x-ray pending 02/08/2019  Plain films of bilateral feet 02/03/2019  Plain films of the right tib-fib 02/03/2019  Antimicrobials:  IV Rocephin 02/02/2019>>>> 02/12/2019  IV vancomycin 02/03/2019>>>> 02/08/2019   Subjective: Patient states her voice is hoarse.  Still with  some cough.  No chest pain.  Still complaining of heel pain.   Objective: Vitals:   02/12/19 2012 02/13/19 0414 02/13/19 0728 02/13/19 0905  BP: (!) 124/51 134/71 126/71   Pulse: 70 68 67   Resp:   17   Temp: 98.8 F (37.1 C) 98.7 F (37.1 C) 98.2 F (36.8 C)   TempSrc: Oral Oral Oral   SpO2: 95% 100% 96% 94%  Weight:      Height:        Intake/Output Summary (Last 24 hours) at 02/13/2019 1157 Last data filed at 02/13/2019 1100 Gross per 24 hour  Intake 960 ml  Output 2900 ml  Net -1940 ml   Filed Weights   02/05/19 1730 02/10/19 0500 02/11/19 0500  Weight: (!) 157 kg (!) 164.8 kg (!) 155.7 kg    Examination:  General exam: NAD.  Respiratory system: Clear to auscultation bilaterally.  No wheezes, no crackles, no rhonchi.  Normal respiratory effort.  Cardiovascular system: Regular rate and rhythm no murmurs rubs or gallops.  No JVD.  No lower extremity edema. Gastrointestinal system: Abdomen is nontender, nondistended, soft, positive bowel sounds.  No rebound.  No guarding. Central nervous system: Alert and oriented. No focal neurological deficits. Extremities: Blister ruptured, noted on plantar surface of left foot.  Right lower extremity wrapped.  Patient with less exquisite tenderness to palpation right heel.  Cellulitis improved. Skin: Blister noted on plantar surface of left foot.  Psychiatry: Judgement and insight appear normal. Mood & affect appropriate.     Data Reviewed: I have personally reviewed following labs and imaging studies  CBC: Recent Labs  Lab 02/08/19 0328 02/09/19 0445 02/10/19 0134 02/12/19 0257 02/13/19 0216  WBC 4.7 3.5* 6.5 7.5  --   NEUTROABS 2.1 3.0  --   --   --   HGB 11.2* 11.4* 11.2* 10.9* 11.3*  HCT 35.7* 36.5 35.2* 34.7* 36.3  MCV 95.7 95.1 95.1 94.8  --   PLT 206 239 231 235  --    Basic Metabolic Panel: Recent Labs  Lab 02/09/19 0445 02/10/19 0134 02/11/19 0518 02/12/19 0257 02/13/19 0216  NA 138 138 139 136 136  K  4.4 4.1 3.9 4.0 4.2  CL 96* 97* 98 96* 94*  CO2 27 29 32 35* 33*  GLUCOSE 226* 144* 147* 149* 132*  BUN 25* 26* 28* 28* 31*  CREATININE 1.14* 1.10* 1.32* 1.33* 1.25*  CALCIUM 8.9 8.9 8.6* 8.6* 9.0  MG 2.0 2.2 2.2 2.3 2.2   GFR: Estimated Creatinine Clearance: 62.8 mL/min (A) (by C-G formula based on SCr of 1.25 mg/dL (H)). Liver Function Tests: No results for input(s): AST, ALT, ALKPHOS, BILITOT, PROT, ALBUMIN in the last 168 hours. No results for input(s): LIPASE, AMYLASE in the last 168 hours. No results for input(s): AMMONIA in the last 168 hours. Coagulation Profile: No results for input(s): INR, PROTIME in the last 168 hours. Cardiac Enzymes: No results for input(s): CKTOTAL, CKMB, CKMBINDEX, TROPONINI in the last 168 hours. BNP (last 3 results) No results for input(s): PROBNP in the last 8760 hours. HbA1C: No results for input(s): HGBA1C in the last 72 hours. CBG: Recent Labs  Lab 02/08/19 1149 02/08/19 1549 02/08/19 2224 02/09/19 0636 02/11/19 1138  GLUCAP 184* 122* 160* 196* 173*   Lipid Profile: No results for  input(s): CHOL, HDL, LDLCALC, TRIG, CHOLHDL, LDLDIRECT in the last 72 hours. Thyroid Function Tests: No results for input(s): TSH, T4TOTAL, FREET4, T3FREE, THYROIDAB in the last 72 hours. Anemia Panel: No results for input(s): VITAMINB12, FOLATE, FERRITIN, TIBC, IRON, RETICCTPCT in the last 72 hours. Sepsis Labs: No results for input(s): PROCALCITON, LATICACIDVEN in the last 168 hours.  Recent Results (from the past 240 hour(s))  SARS CORONAVIRUS 2 (TAT 6-24 HRS) Nasopharyngeal Nasopharyngeal Swab     Status: None   Collection Time: 02/05/19  9:52 AM   Specimen: Nasopharyngeal Swab  Result Value Ref Range Status   SARS Coronavirus 2 NEGATIVE NEGATIVE Final    Comment: (NOTE) SARS-CoV-2 target nucleic acids are NOT DETECTED. The SARS-CoV-2 RNA is generally detectable in upper and lower respiratory specimens during the acute phase of infection.  Negative results do not preclude SARS-CoV-2 infection, do not rule out co-infections with other pathogens, and should not be used as the sole basis for treatment or other patient management decisions. Negative results must be combined with clinical observations, patient history, and epidemiological information. The expected result is Negative. Fact Sheet for Patients: SugarRoll.be Fact Sheet for Healthcare Providers: https://www.woods-mathews.com/ This test is not yet approved or cleared by the [place] FDA and  has been authorized for detection and/or diagnosis of SARS-CoV-2 by FDA under an Emergency Use Authorization (EUA). This EUA will remain  in effect (meaning this test can be used) for the duration of the COVID-19 declaration under Section 56 4(b)(1) of the Act, 21 U.S.C. section 360bbb-3(b)(1), unless the authorization is terminated or revoked sooner. Performed at [facility], [place] [address] [place], [place] [address]   SARS CORONAVIRUS 2 (TAT 6-24 HRS) Nasopharyngeal Nasopharyngeal Swab     Status: None   Collection Time: 02/08/19  2:09 PM   Specimen: Nasopharyngeal Swab  Result Value Ref Range Status   SARS Coronavirus 2 NEGATIVE NEGATIVE Final    Comment: (NOTE) SARS-CoV-2 target nucleic acids are NOT DETECTED. The SARS-CoV-2 RNA is generally detectable in upper and lower respiratory specimens during the acute phase of infection. Negative results do not preclude SARS-CoV-2 infection, do not rule out co-infections with other pathogens, and should not be used as the sole basis for treatment or other patient management decisions. Negative results must be combined with clinical observations, patient history, and epidemiological information. The expected result is Negative. Fact Sheet for Patients: SugarRoll.be Fact Sheet for Healthcare  Providers: https://www.woods-mathews.com/ This test is not yet approved or cleared by the [place] FDA and  has been authorized for detection and/or diagnosis of SARS-CoV-2 by FDA under an Emergency Use Authorization (EUA). This EUA will remain  in effect (meaning this test can be used) for the duration of the COVID-19 declaration under Section 56 4(b)(1) of the Act, 21 U.S.C. section 360bbb-3(b)(1), unless the authorization is terminated or revoked sooner. Performed at [facility], [place] [address] [place], [place] [address]          Radiology Studies: DG CHEST PORT 1 VIEW  Result Date: 02/11/2019 CLINICAL DATA:  Cough.  COVID-19 positive EXAM: PORTABLE CHEST 1 VIEW COMPARISON:  February 08, 2019 FINDINGS: There is ill-defined patchy opacity in the lung bases. Lungs elsewhere clear. Heart is upper normal in size with pulmonary vascularity normal. There is aortic atherosclerosis. No adenopathy. No bone lesions. IMPRESSION: Bibasilar ill-defined airspace opacity, likely atypical organism pneumonia. Lungs elsewhere clear. Heart upper normal in size. No adenopathy. Aortic Atherosclerosis (ICD10-I70.0). Electronically Signed   By:  Lowella Grip III M.D.   On: 02/11/2019 14:17        Scheduled Meds: . allopurinol  100 mg Oral Daily  . aspirin EC  81 mg Oral QHS  . carvedilol  12.5 mg Oral BID WC  . colchicine  0.3 mg Oral Daily  . diclofenac sodium  2 g Topical QID  . famotidine  20 mg Oral BID  . ferrous sulfate  325 mg Oral Q breakfast  . fluticasone furoate-vilanterol  1 puff Inhalation Daily  . furosemide  120 mg Oral BID  . gabapentin  100 mg Oral TID  . heparin  5,000 Units Subcutaneous Q8H  . hydrocortisone cream   Topical BID  . lactobacillus acidophilus  2 tablet Oral TID  . montelukast  10 mg Oral QHS  . Oxcarbazepine  300 mg Oral BID  . silver sulfADIAZINE  1 application Topical Daily  . simvastatin  40 mg Oral QHS  . spironolactone  25 mg  Oral Daily   Continuous Infusions:    LOS: 11 days    Time spent: 35 minutes    Irine Seal, MD [facility]  If 7PM-7AM, please contact night-coverage [email or web address] 02/13/2019, 11:57 AM

## 2019-02-14 ENCOUNTER — Telehealth: Payer: Self-pay | Admitting: Internal Medicine

## 2019-02-14 LAB — BASIC METABOLIC PANEL
Anion gap: 11 (ref 5–15)
BUN: 30 mg/dL — ABNORMAL HIGH (ref 8–23)
CO2: 32 mmol/L (ref 22–32)
Calcium: 8.6 mg/dL — ABNORMAL LOW (ref 8.9–10.3)
Chloride: 95 mmol/L — ABNORMAL LOW (ref 98–111)
Creatinine, Ser: 1.33 mg/dL — ABNORMAL HIGH (ref 0.44–1.00)
GFR calc Af Amer: 47 mL/min — ABNORMAL LOW (ref >60)
GFR calc non Af Amer: 41 mL/min — ABNORMAL LOW (ref >60)
Glucose, Bld: 126 mg/dL — ABNORMAL HIGH (ref 70–99)
Potassium: 3.8 mmol/L (ref 3.5–5.1)
Sodium: 138 mmol/L (ref 135–145)

## 2019-02-14 LAB — MAGNESIUM: Magnesium: 2.2 mg/dL (ref 1.7–2.4)

## 2019-02-14 MED ORDER — GABAPENTIN 100 MG PO CAPS
100.0000 mg | ORAL_CAPSULE | Freq: Once | ORAL | Status: DC
  Filled 2019-02-14: qty 1

## 2019-02-14 MED ORDER — GABAPENTIN 100 MG PO CAPS
200.0000 mg | ORAL_CAPSULE | Freq: Three times a day (TID) | ORAL | Status: DC
  Administered 2019-02-14 – 2019-02-19 (×16): 200 mg via ORAL
  Filled 2019-02-14 (×16): qty 2

## 2019-02-14 NOTE — Telephone Encounter (Signed)
Patient's husband has dropped off FMLA to be completed for caring for wife. He is requesting intermitting leave.

## 2019-02-14 NOTE — Progress Notes (Addendum)
PROGRESS NOTE    April [name]  RDE:[id number] DOB: [birth year] DOA: 02/02/2019 PCP: [name], MD    Brief Narrative:  69 year old female with history of hypertension, hyperlipidemia, diabetes mellitus type 2, neuropathy, chronic kidney disease stage III, CAD, asthma, morbid obesity, recent Covid infection in November requiring hospitalization and treatment with steroids/remdesivir, chronic right shin wound, chronic ambulatory dysfunction presented with worsening weakness along with new pain and rash around the right shin ulcer area along with painful blister on the bottom of the left foot.  She was started on antibiotics for cellulitis.    Assessment & Plan:   Active Problems:   Neuropathy   Cellulitis   Non-pressure chronic ulcer of left heel and midfoot limited to breakdown of skin (HCC)   Idiopathic chronic gout of multiple sites without tophus   SOB (shortness of breath)   Acute idiopathic gout of right foot   Morbid obesity (HCC)  1 cellulitis surrounding right chronic wound of the shin,POA/left plantar foot blister with possible cellulitis, POA Patient admitted with cellulitis and plantar foot blister and placed on empiric IV vancomycin and IV Rocephin.  Plain films of bilateral feet as well as right tib-fib showed no acute lesions except some soft tissue swelling of the left foot.  Orthopedics was consulted patient seen in consultation by orthopedics who recommended nonweightbearing for both extremities and to continue antibiotics and transition to oral antibiotics on discharge.  MRSA nasal PCR was negative.  Patient status post 10-day antibiotics.  Improved clinically.  IV vancomycin IV Rocephin have been discontinued.  No further antibiotics needed at this time.  2.  Probable acute gout flare Patient with some exquisite tenderness to palpation on the right heel.  Patient with a history of uncontrolled gout per orthopedics.  Uric acid level was ordered which was elevated  at 11.1.  Status post Solu-Medrol 60 mg IV x1 on 02/08/2019.  Status post 5-day course of oral prednisone 40 mg daily.  Will need outpatient follow-up with PCP may need to be started on allopurinol in the future.    3.  Acute on chronic ambulatory dysfunction/generalized deconditioning PT recommended SNF.  Social work consulted.  4.  Acute kidney injury on chronic kidney disease stage III Baseline creatinine fluctuates between 1.2-1.5. Patient's diuretics resumed on 02/05/2019 at a lower dose of 80 mg twice daily and dose increased back to home regimen of 120 mg twice daily.  Spironolactone resumed.  Renal function improved and creatinine currently at 1.33.  Potassium at 3.8. Follow.   5.  Chronic neuropathy Patient still with complaints of heel pain which likely secondary to neuropathy.  Continue gabapentin which is renally dosed.  Increase gabapentin to 200 mg 3 times daily.  Discussed with pharmacist and could go up as high as 600 mg 3 times daily.  Patient states took Lyrica before in the past however hallucinated and as such discontinued.  6.  Hypertension Blood pressure was borderline and improved.  Continue Coreg, spironolactone, Lasix.  Follow.   7.  Asthma/COPD Patient with some complaints of shortness of breath which has improved with diuresis. Chest x-ray with bibasilar ill-defined airspace opacity likely atypical organism pneumonia.  Lung sounds were clear.  Patient recently treated for COVID-19 in November 2020 and likely chest x-ray findings from prior COVID-19 infection..  Patient is afebrile.  Patient is not hypoxic.  Patient with a normal white count.  No need for further antibiotics at this time.  Supportive care.  Follow.  8.  Morbid  obesity     DVT prophylaxis: Heparin Code Status: Full Family Communication: Updated patient.  No family at bedside. Disposition Plan: SNF when bed available.   Consultants:   Orthopedics: Dr. [name] 02/04/2019  Procedures:   Chest x-ray  pending 02/08/2019  Plain films of bilateral feet 02/03/2019  Plain films of the right tib-fib 02/03/2019  Antimicrobials:  IV Rocephin 02/02/2019>>>> 02/12/2019  IV vancomycin 02/03/2019>>>> 02/08/2019   Subjective: Patient sitting up in bed.  Feels shortness of breath keeps going back and forth however okay today.  No chest pain.  Still with complaints of heel pain.   Objective: Vitals:   02/14/19 0354 02/14/19 0600 02/14/19 0736 02/14/19 0744  BP: (!) 143/72  127/68   Pulse: 70  70   Resp: 17  17   Temp: 97.9 F (36.6 C)  98.5 F (36.9 C)   TempSrc: Oral  Oral   SpO2: 95%  (!) 86% 91%  Weight:  (!) 154.1 kg    Height:        Intake/Output Summary (Last 24 hours) at 02/14/2019 1237 Last data filed at 02/14/2019 0400 Gross per 24 hour  Intake 1020 ml  Output 1400 ml  Net -380 ml   Filed Weights   02/10/19 0500 02/11/19 0500 02/14/19 0600  Weight: (!) 164.8 kg (!) 155.7 kg (!) 154.1 kg    Examination:  General exam: NAD.  Respiratory system: Lungs clear to auscultation bilaterally.  No wheezes, no crackles, no rhonchi.  Upper airway noise.   Cardiovascular system: RRR no murmurs rubs or gallops.  No JVD.  No lower extremity edema. Gastrointestinal system: Abdomen is soft, nontender, nondistended, positive bowel sounds.  No rebound.  No guarding. Central nervous system: Alert and oriented. No focal neurological deficits. Extremities: Blister ruptured, noted on plantar surface of left foot.  Right lower extremity wrapped.  Patient with less exquisite tenderness to palpation right heel.  Cellulitis improved. Skin: Blister noted on plantar surface of left foot.  Psychiatry: Judgement and insight appear normal. Mood & affect appropriate.     Data Reviewed: I have personally reviewed following labs and imaging studies  CBC: Recent Labs  Lab 02/08/19 0328 02/09/19 0445 02/10/19 0134 02/12/19 0257 02/13/19 0216  WBC 4.7 3.5* 6.5 7.5  --   NEUTROABS 2.1 3.0  --   --   --    HGB 11.2* 11.4* 11.2* 10.9* 11.3*  HCT 35.7* 36.5 35.2* 34.7* 36.3  MCV 95.7 95.1 95.1 94.8  --   PLT 206 239 231 235  --    Basic Metabolic Panel: Recent Labs  Lab 02/10/19 0134 02/11/19 0518 02/12/19 0257 02/13/19 0216 02/14/19 0402  NA 138 139 136 136 138  K 4.1 3.9 4.0 4.2 3.8  CL 97* 98 96* 94* 95*  CO2 29 32 35* 33* 32  GLUCOSE 144* 147* 149* 132* 126*  BUN 26* 28* 28* 31* 30*  CREATININE 1.10* 1.32* 1.33* 1.25* 1.33*  CALCIUM 8.9 8.6* 8.6* 9.0 8.6*  MG 2.2 2.2 2.3 2.2 2.2   GFR: Estimated Creatinine Clearance: 58.6 mL/min (A) (by C-G formula based on SCr of 1.33 mg/dL (H)). Liver Function Tests: No results for input(s): AST, ALT, ALKPHOS, BILITOT, PROT, ALBUMIN in the last 168 hours. No results for input(s): LIPASE, AMYLASE in the last 168 hours. No results for input(s): AMMONIA in the last 168 hours. Coagulation Profile: No results for input(s): INR, PROTIME in the last 168 hours. Cardiac Enzymes: No results for input(s): CKTOTAL, CKMB, CKMBINDEX, TROPONINI in  the last 168 hours. BNP (last 3 results) No results for input(s): PROBNP in the last 8760 hours. HbA1C: No results for input(s): HGBA1C in the last 72 hours. CBG: Recent Labs  Lab 02/08/19 1149 02/08/19 1549 02/08/19 2224 02/09/19 0636 02/11/19 1138  GLUCAP 184* 122* 160* 196* 173*   Lipid Profile: No results for input(s): CHOL, HDL, LDLCALC, TRIG, CHOLHDL, LDLDIRECT in the last 72 hours. Thyroid Function Tests: No results for input(s): TSH, T4TOTAL, FREET4, T3FREE, THYROIDAB in the last 72 hours. Anemia Panel: No results for input(s): VITAMINB12, FOLATE, FERRITIN, TIBC, IRON, RETICCTPCT in the last 72 hours. Sepsis Labs: No results for input(s): PROCALCITON, LATICACIDVEN in the last 168 hours.  Recent Results (from the past 240 hour(s))  SARS CORONAVIRUS 2 (TAT 6-24 HRS) Nasopharyngeal Nasopharyngeal Swab     Status: None   Collection Time: 02/05/19  9:52 AM   Specimen: Nasopharyngeal Swab    Result Value Ref Range Status   SARS Coronavirus 2 NEGATIVE NEGATIVE Final    Comment: (NOTE) SARS-CoV-2 target nucleic acids are NOT DETECTED. The SARS-CoV-2 RNA is generally detectable in upper and lower respiratory specimens during the acute phase of infection. Negative results do not preclude SARS-CoV-2 infection, do not rule out co-infections with other pathogens, and should not be used as the sole basis for treatment or other patient management decisions. Negative results must be combined with clinical observations, patient history, and epidemiological information. The expected result is Negative. Fact Sheet for Patients: SugarRoll.be Fact Sheet for Healthcare Providers: https://www.woods-mathews.com/ This test is not yet approved or cleared by the [place] FDA and  has been authorized for detection and/or diagnosis of SARS-CoV-2 by FDA under an Emergency Use Authorization (EUA). This EUA will remain  in effect (meaning this test can be used) for the duration of the COVID-19 declaration under Section 56 4(b)(1) of the Act, 21 U.S.C. section 360bbb-3(b)(1), unless the authorization is terminated or revoked sooner. Performed at [facility], [place] [address], [place], [place] [address]   SARS CORONAVIRUS 2 (TAT 6-24 HRS) Nasopharyngeal Nasopharyngeal Swab     Status: None   Collection Time: 02/08/19  2:09 PM   Specimen: Nasopharyngeal Swab  Result Value Ref Range Status   SARS Coronavirus 2 NEGATIVE NEGATIVE Final    Comment: (NOTE) SARS-CoV-2 target nucleic acids are NOT DETECTED. The SARS-CoV-2 RNA is generally detectable in upper and lower respiratory specimens during the acute phase of infection. Negative results do not preclude SARS-CoV-2 infection, do not rule out co-infections with other pathogens, and should not be used as the sole basis for treatment or other patient management decisions. Negative results must be  combined with clinical observations, patient history, and epidemiological information. The expected result is Negative. Fact Sheet for Patients: SugarRoll.be Fact Sheet for Healthcare Providers: https://www.woods-mathews.com/ This test is not yet approved or cleared by the [place] FDA and  has been authorized for detection and/or diagnosis of SARS-CoV-2 by FDA under an Emergency Use Authorization (EUA). This EUA will remain  in effect (meaning this test can be used) for the duration of the COVID-19 declaration under Section 56 4(b)(1) of the Act, 21 U.S.C. section 360bbb-3(b)(1), unless the authorization is terminated or revoked sooner. Performed at [facility], [place] [address], [place], [place] [address]          Radiology Studies: No results found.      Scheduled Meds: . allopurinol  100 mg Oral Daily  . aspirin EC  81 mg Oral QHS  .  carvedilol  12.5 mg Oral BID WC  . colchicine  0.3 mg Oral Daily  . diclofenac sodium  2 g Topical QID  . famotidine  20 mg Oral BID  . ferrous sulfate  325 mg Oral Q breakfast  . fluticasone furoate-vilanterol  1 puff Inhalation Daily  . furosemide  120 mg Oral BID  . gabapentin  100 mg Oral TID  . heparin  5,000 Units Subcutaneous Q8H  . hydrocortisone cream   Topical BID  . lactobacillus acidophilus  2 tablet Oral TID  . montelukast  10 mg Oral QHS  . Oxcarbazepine  300 mg Oral BID  . silver sulfADIAZINE  1 application Topical Daily  . simvastatin  40 mg Oral QHS  . spironolactone  25 mg Oral Daily   Continuous Infusions:    LOS: 12 days    Time spent: 35 minutes    Irine Seal, MD [facility]  If 7PM-7AM, please contact night-coverage [email or web address] 02/14/2019, 12:37 PM

## 2019-02-14 NOTE — Plan of Care (Signed)

## 2019-02-14 NOTE — Telephone Encounter (Signed)
Renewal of forms have been completed & Placed in providers box to review and sign.

## 2019-02-14 NOTE — Plan of Care (Signed)
  Problem: Skin Integrity: Goal: Risk for impaired skin integrity will decrease 02/14/2019 0102 by Josepha Pigg, RN Outcome: Progressing 02/14/2019 0101 by Josepha Pigg, RN Outcome: Progressing   Problem: Pain Managment: Goal: General experience of comfort will improve 02/14/2019 0102 by Josepha Pigg, RN Outcome: Progressing 02/14/2019 0101 by Josepha Pigg, RN Outcome: Progressing   Problem: Elimination: Goal: Will not experience complications related to bowel motility 02/14/2019 0102 by Josepha Pigg, RN Outcome: Progressing 02/14/2019 0101 by Josepha Pigg, RN Outcome: Progressing   Problem: Clinical Measurements: Goal: Will remain free from infection 02/14/2019 0102 by Josepha Pigg, RN Outcome: Progressing 02/14/2019 0101 by Josepha Pigg, RN Outcome: Progressing

## 2019-02-15 LAB — BASIC METABOLIC PANEL
Anion gap: 10 (ref 5–15)
BUN: 34 mg/dL — ABNORMAL HIGH (ref 8–23)
CO2: 33 mmol/L — ABNORMAL HIGH (ref 22–32)
Calcium: 8.5 mg/dL — ABNORMAL LOW (ref 8.9–10.3)
Chloride: 92 mmol/L — ABNORMAL LOW (ref 98–111)
Creatinine, Ser: 1.47 mg/dL — ABNORMAL HIGH (ref 0.44–1.00)
GFR calc Af Amer: 42 mL/min — ABNORMAL LOW (ref >60)
GFR calc non Af Amer: 36 mL/min — ABNORMAL LOW (ref >60)
Glucose, Bld: 151 mg/dL — ABNORMAL HIGH (ref 70–99)
Potassium: 4.2 mmol/L (ref 3.5–5.1)
Sodium: 135 mmol/L (ref 135–145)

## 2019-02-15 LAB — URINALYSIS, ROUTINE W REFLEX MICROSCOPIC
Bilirubin Urine: NEGATIVE
Glucose, UA: NEGATIVE mg/dL
Hgb urine dipstick: NEGATIVE
Ketones, ur: NEGATIVE mg/dL
Leukocytes,Ua: NEGATIVE
Nitrite: NEGATIVE
Protein, ur: NEGATIVE mg/dL
Specific Gravity, Urine: 1.006 (ref 1.005–1.030)
pH: 8 (ref 5.0–8.0)

## 2019-02-15 LAB — HEMOGLOBIN AND HEMATOCRIT, BLOOD
HCT: 37 % (ref 36.0–46.0)
Hemoglobin: 11.7 g/dL — ABNORMAL LOW (ref 12.0–15.0)

## 2019-02-15 LAB — MAGNESIUM: Magnesium: 2.2 mg/dL (ref 1.7–2.4)

## 2019-02-15 NOTE — Care Management Important Message (Signed)
Important Message  Patient Details  Name: April [name] MRN: [id number] Date of Birth: 26-Mar-1950   Medicare Important Message Given:  Yes     Memory Argue 02/15/2019, 11:56 AM

## 2019-02-15 NOTE — Progress Notes (Signed)
Patient's bottom is becoming raw from incontinence and refusing turns often. Patient educated on importance of turning side to side to prevent further skin breakdown.

## 2019-02-15 NOTE — Progress Notes (Signed)
PROGRESS NOTE    April [name]  SWN:[id number] DOB: 1950-07-10 DOA: 02/02/2019 PCP: [name], MD     Brief Narrative:  April [name] is a 69 year old female with history of hypertension, hyperlipidemia, diabetes mellitus type 2, neuropathy, chronic kidney disease stage III, CAD, asthma, morbid obesity, recent Covid infection in November requiring hospitalization and treatment with steroids/remdesivir, chronic right shin wound, chronic ambulatory dysfunction presented with worsening weakness along with new pain and rash around the right shin ulcer area along with painful blister on the bottom of the left foot. She was started on antibiotics for cellulitis.  New events last 24 hours / Subjective: Complains of some nausea and headache as well as dysuria.  States that she has had UTIs in the past and this feels similar to her previous UTI episodes.  Assessment & Plan:   Active Problems:   Neuropathy   Cellulitis   Non-pressure chronic ulcer of left heel and midfoot limited to breakdown of skin (HCC)   Idiopathic chronic gout of multiple sites without tophus   SOB (shortness of breath)   Acute idiopathic gout of right foot   Morbid obesity (HCC)   Cellulitis surrounding right chronic wound of the shin, POA/left plantar foot blister with possible cellulitis, POA -Patient admitted with cellulitis and plantar foot blister and placed on empiric IV vancomycin and IV Rocephin.  Plain films of bilateral feet as well as right tib-fib showed no acute lesions except some soft tissue swelling of the left foot.  Orthopedics was consulted patient seen in consultation by orthopedics who recommended nonweightbearing for both extremities and to continue antibiotics and transition to oral antibiotics on discharge.  MRSA nasal PCR was negative.  Patient completed 10-day antibiotics.    Acute gout flare -Patient with some exquisite tenderness to palpation on the right heel.  Patient with a  history of uncontrolled gout per orthopedics.  Uric acid level was ordered which was elevated at 11.1.  Status post Solu-Medrol 60 mg IV x1 on 02/08/2019.  Status post 5-day course of oral prednisone 40 mg daily. Continue allopurinol   Acute on chronic ambulatory dysfunction/generalized deconditioning -PT recommended SNF.  Social work consulted.  Acute kidney injury on chronic kidney disease stage IIIa -Baseline creatinine fluctuates between 1.2-1.5. Patient's diuretics resumed on 02/05/2019 at a lower dose of 80 mg twice daily and dose increased back to home regimen of 120 mg twice daily.  Spironolactone resumed -Creatinine remains stable  Chronic neuropathy -Patient still with complaints of heel pain which likely secondary to neuropathy.  Continue gabapentin which is renally dosed.  Increase gabapentin to 200 mg 3 times daily.  Discussed with pharmacist and could go up as high as 600 mg 3 times daily.  Patient states took Lyrica before in the past however hallucinated and as such discontinued.  Hypertension -Continue Coreg, spironolactone, Lasix  Hyperlipidemia -Continue Zocor  Asthma/COPD -Patient with some complaints of shortness of breath which has improved with diuresis. Chest x-ray with bibasilar ill-defined airspace opacity likely atypical organism pneumonia.  Lung sounds were clear.  Patient recently treated for COVID-19 in November 2020 and likely chest x-ray findings from prior COVID-19 infection.  Currently on room air  Morbid obesity Estimated body mass index is 62.14 kg/m as calculated from the following:   Height as of this encounter: 5\' 2"  (1.575 m).   Weight as of this encounter: 154.1 kg.   Dysuria -Check urinalysis and urine culture   DVT prophylaxis: Subcutaneous heparin Code Status: Full code  Family Communication: None at bedside Disposition Plan: SNF placement pending   Consultants:   Orthopedic surgery, Dr. [name]  Antimicrobials:  Anti-infectives  (From admission, onward)   Start     Dose/Rate Route Frequency Ordered Stop   02/07/19 1400  vancomycin (VANCOREADY) IVPB 1250 mg/250 mL  Status:  Discontinued     1,250 mg 166.7 mL/hr over 90 Minutes Intravenous Every 24 hours 02/07/19 1313 02/08/19 1456   02/05/19 1400  vancomycin (VANCOREADY) IVPB 2000 mg/400 mL  Status:  Discontinued     2,000 mg 200 mL/hr over 120 Minutes Intravenous Every 48 hours 02/03/19 1153 02/04/19 1323   02/05/19 1400  vancomycin (VANCOREADY) IVPB 1500 mg/300 mL  Status:  Discontinued     1,500 mg 150 mL/hr over 120 Minutes Intravenous Every 24 hours 02/05/19 1020 02/07/19 1313   02/04/19 1400  vancomycin (VANCOREADY) IVPB 1250 mg/250 mL  Status:  Discontinued     1,250 mg 166.7 mL/hr over 90 Minutes Intravenous Every 24 hours 02/04/19 1323 02/05/19 1020   02/03/19 1245  vancomycin (VANCOCIN) 2,500 mg in sodium chloride 0.9 % 500 mL IVPB     2,500 mg 250 mL/hr over 120 Minutes Intravenous  Once 02/03/19 1143 02/03/19 1434   02/02/19 2000  cefTRIAXone (ROCEPHIN) 2 g in sodium chloride 0.9 % 100 mL IVPB  Status:  Discontinued     2 g 200 mL/hr over 30 Minutes Intravenous Every 24 hours 02/02/19 1908 02/12/19 0814   02/02/19 1830  clindamycin (CLEOCIN) IVPB 600 mg     600 mg 100 mL/hr over 30 Minutes Intravenous  Once 02/02/19 1822 02/02/19 1921        Objective: Vitals:   02/14/19 2006 02/15/19 0323 02/15/19 0736 02/15/19 0811  BP: (!) 138/54 134/66 135/66   Pulse: 77 76 67   Resp: 17 19    Temp: 99.5 F (37.5 C) 99 F (37.2 C) 99.2 F (37.3 C)   TempSrc: Oral  Oral   SpO2: 96% 94% 93% 96%  Weight:      Height:        Intake/Output Summary (Last 24 hours) at 02/15/2019 1356 Last data filed at 02/15/2019 1200 Gross per 24 hour  Intake -  Output 1251 ml  Net -1251 ml   Filed Weights   02/10/19 0500 02/11/19 0500 02/14/19 0600  Weight: (!) 164.8 kg (!) 155.7 kg (!) 154.1 kg    Examination:  General exam: Appears calm and comfortable   Respiratory system: Clear to auscultation. Respiratory effort normal. No respiratory distress. No conversational dyspnea.  Cardiovascular system: S1 & S2 heard, RRR. No murmurs. No pedal edema. Gastrointestinal system: Abdomen is nondistended, soft and nontender. Normal bowel sounds heard. Central nervous system: Alert and oriented. No focal neurological deficits. Speech clear.  Extremities: Symmetric in appearance  Skin: Right shin cellulitis without drainage, left plantar eschar Psychiatry: Judgement and insight appear normal. Mood & affect appropriate.   Data Reviewed: I have personally reviewed following labs and imaging studies  CBC: Recent Labs  Lab 02/09/19 0445 02/10/19 0134 02/12/19 0257 02/13/19 0216 02/15/19 0433  WBC 3.5* 6.5 7.5  --   --   NEUTROABS 3.0  --   --   --   --   HGB 11.4* 11.2* 10.9* 11.3* 11.7*  HCT 36.5 35.2* 34.7* 36.3 37.0  MCV 95.1 95.1 94.8  --   --   PLT 239 231 235  --   --    Basic Metabolic Panel: Recent Labs  Lab 02/11/19  7408 02/12/19 0257 02/13/19 0216 02/14/19 0402 02/15/19 0433  NA 139 136 136 138 135  K 3.9 4.0 4.2 3.8 4.2  CL 98 96* 94* 95* 92*  CO2 32 35* 33* 32 33*  GLUCOSE 147* 149* 132* 126* 151*  BUN 28* 28* 31* 30* 34*  CREATININE 1.32* 1.33* 1.25* 1.33* 1.47*  CALCIUM 8.6* 8.6* 9.0 8.6* 8.5*  MG 2.2 2.3 2.2 2.2 2.2   GFR: Estimated Creatinine Clearance: 53 mL/min (A) (by C-G formula based on SCr of 1.47 mg/dL (H)). Liver Function Tests: No results for input(s): AST, ALT, ALKPHOS, BILITOT, PROT, ALBUMIN in the last 168 hours. No results for input(s): LIPASE, AMYLASE in the last 168 hours. No results for input(s): AMMONIA in the last 168 hours. Coagulation Profile: No results for input(s): INR, PROTIME in the last 168 hours. Cardiac Enzymes: No results for input(s): CKTOTAL, CKMB, CKMBINDEX, TROPONINI in the last 168 hours. BNP (last 3 results) No results for input(s): PROBNP in the last 8760 hours. HbA1C: No  results for input(s): HGBA1C in the last 72 hours. CBG: Recent Labs  Lab 02/08/19 1549 02/08/19 2224 02/09/19 0636 02/11/19 1138  GLUCAP 122* 160* 196* 173*   Lipid Profile: No results for input(s): CHOL, HDL, LDLCALC, TRIG, CHOLHDL, LDLDIRECT in the last 72 hours. Thyroid Function Tests: No results for input(s): TSH, T4TOTAL, FREET4, T3FREE, THYROIDAB in the last 72 hours. Anemia Panel: No results for input(s): VITAMINB12, FOLATE, FERRITIN, TIBC, IRON, RETICCTPCT in the last 72 hours. Sepsis Labs: No results for input(s): PROCALCITON, LATICACIDVEN in the last 168 hours.  Recent Results (from the past 240 hour(s))  SARS CORONAVIRUS 2 (TAT 6-24 HRS) Nasopharyngeal Nasopharyngeal Swab     Status: None   Collection Time: 02/08/19  2:09 PM   Specimen: Nasopharyngeal Swab  Result Value Ref Range Status   SARS Coronavirus 2 NEGATIVE NEGATIVE Final    Comment: (NOTE) SARS-CoV-2 target nucleic acids are NOT DETECTED. The SARS-CoV-2 RNA is generally detectable in upper and lower respiratory specimens during the acute phase of infection. Negative results do not preclude SARS-CoV-2 infection, do not rule out co-infections with other pathogens, and should not be used as the sole basis for treatment or other patient management decisions. Negative results must be combined with clinical observations, patient history, and epidemiological information. The expected result is Negative. Fact Sheet for Patients: SugarRoll.be Fact Sheet for Healthcare Providers: https://www.woods-mathews.com/ This test is not yet approved or cleared by the [place] FDA and  has been authorized for detection and/or diagnosis of SARS-CoV-2 by FDA under an Emergency Use Authorization (EUA). This EUA will remain  in effect (meaning this test can be used) for the duration of the COVID-19 declaration under Section 56 4(b)(1) of the Act, 21 U.S.C. section 360bbb-3(b)(1),  unless the authorization is terminated or revoked sooner. Performed at [facility], [place] [address] [place], [place] [address]       Radiology Studies: No results found.    Scheduled Meds: . allopurinol  100 mg Oral Daily  . aspirin EC  81 mg Oral QHS  . carvedilol  12.5 mg Oral BID WC  . colchicine  0.3 mg Oral Daily  . diclofenac sodium  2 g Topical QID  . famotidine  20 mg Oral BID  . ferrous sulfate  325 mg Oral Q breakfast  . fluticasone furoate-vilanterol  1 puff Inhalation Daily  . furosemide  120 mg Oral BID  . gabapentin  100 mg Oral Once  . gabapentin  200 mg Oral TID  . heparin  5,000 Units Subcutaneous Q8H  . hydrocortisone cream   Topical BID  . lactobacillus acidophilus  2 tablet Oral TID  . montelukast  10 mg Oral QHS  . Oxcarbazepine  300 mg Oral BID  . silver sulfADIAZINE  1 application Topical Daily  . simvastatin  40 mg Oral QHS  . spironolactone  25 mg Oral Daily   Continuous Infusions:   LOS: 13 days      Time spent: 35 minutes   Dessa Phi, DO [facility] 02/15/2019, 1:56 PM   Available via Epic secure chat 7am-7pm After these hours, please refer to coverage provider listed on [email or web address]

## 2019-02-16 LAB — BASIC METABOLIC PANEL
Anion gap: 11 (ref 5–15)
BUN: 30 mg/dL — ABNORMAL HIGH (ref 8–23)
CO2: 34 mmol/L — ABNORMAL HIGH (ref 22–32)
Calcium: 8.8 mg/dL — ABNORMAL LOW (ref 8.9–10.3)
Chloride: 92 mmol/L — ABNORMAL LOW (ref 98–111)
Creatinine, Ser: 1.32 mg/dL — ABNORMAL HIGH (ref 0.44–1.00)
GFR calc Af Amer: 48 mL/min — ABNORMAL LOW (ref >60)
GFR calc non Af Amer: 41 mL/min — ABNORMAL LOW (ref >60)
Glucose, Bld: 138 mg/dL — ABNORMAL HIGH (ref 70–99)
Potassium: 3.9 mmol/L (ref 3.5–5.1)
Sodium: 137 mmol/L (ref 135–145)

## 2019-02-16 MED ORDER — WHITE PETROLATUM EX OINT
TOPICAL_OINTMENT | CUTANEOUS | Status: AC
  Administered 2019-02-16: 1
  Filled 2019-02-16: qty 28.35

## 2019-02-16 NOTE — Plan of Care (Signed)

## 2019-02-16 NOTE — Telephone Encounter (Signed)
Forms have been signed, Copy sent to scan &Charged for under [employer].   LVM to inform [name] the forms are ready to be picked up with a copy for himself. If he has a fax number he would like me to send them to, Then please call back with that information otherwise come by anytime to pick up.

## 2019-02-16 NOTE — Progress Notes (Signed)
PROGRESS NOTE    April [name]  WCH:[id number] DOB: 04/14/1950 DOA: 02/02/2019 PCP: [name], MD     Brief Narrative:  April [name] is a 69 year old female with history of hypertension, hyperlipidemia, diabetes mellitus type 2, neuropathy, chronic kidney disease stage III, CAD, asthma, morbid obesity, recent Covid infection in November requiring hospitalization and treatment with steroids/remdesivir, chronic right shin wound, chronic ambulatory dysfunction presented with worsening weakness along with new pain and rash around the right shin ulcer area along with painful blister on the bottom of the left foot. She has now completed antibiotics for cellulitis.  New events last 24 hours / Subjective: Complains of some burning pain in her leg, also has some itching overnight  Assessment & Plan:   Active Problems:   Neuropathy   Cellulitis   Non-pressure chronic ulcer of left heel and midfoot limited to breakdown of skin (HCC)   Idiopathic chronic gout of multiple sites without tophus   SOB (shortness of breath)   Acute idiopathic gout of right foot   Morbid obesity (HCC)   Cellulitis surrounding right chronic wound of the shin, POA/left plantar foot blister with possible cellulitis, POA -Patient admitted with cellulitis and plantar foot blister and placed on empiric IV vancomycin and IV Rocephin.  Plain films of bilateral feet as well as right tib-fib showed no acute lesions except some soft tissue swelling of the left foot.  Orthopedics was consulted patient seen in consultation by orthopedics who recommended nonweightbearing for both extremities and to continue antibiotics and transition to oral antibiotics on discharge.  MRSA nasal PCR was negative.  Patient completed 10-day antibiotics.    Acute gout flare -Patient with some exquisite tenderness to palpation on the right heel.  Patient with a history of uncontrolled gout per orthopedics.  Uric acid level was ordered which  was elevated at 11.1.  Status post Solu-Medrol 60 mg IV x1 on 02/08/2019.  Status post 5-day course of oral prednisone 40 mg daily. Continue allopurinol   Acute on chronic ambulatory dysfunction/generalized deconditioning -PT recommended SNF.  Social work consulted.  Acute kidney injury on chronic kidney disease stage IIIa -Baseline creatinine fluctuates between 1.2-1.5. Patient's diuretics resumed on 02/05/2019 at a lower dose of 80 mg twice daily and dose increased back to home regimen of 120 mg twice daily.  Spironolactone resumed -Creatinine remains stable  Chronic neuropathy -Patient still with complaints of heel pain which likely secondary to neuropathy.  Continue gabapentin which is renally dosed.  Increase gabapentin to 200 mg 3 times daily.  Discussed with pharmacist and could go up as high as 600 mg 3 times daily.  Patient states took Lyrica before in the past however hallucinated and as such discontinued.  Hypertension -Continue Coreg, spironolactone, Lasix  Hyperlipidemia -Continue Zocor  Asthma/COPD -Patient with some complaints of shortness of breath which has improved with diuresis. Chest x-ray with bibasilar ill-defined airspace opacity likely atypical organism pneumonia.  Lung sounds were clear.  Patient recently treated for COVID-19 in November 2020 and likely chest x-ray findings from prior COVID-19 infection.  Currently on room air  Morbid obesity Estimated body mass index is 62.14 kg/m as calculated from the following:   Height as of this encounter: 5\' 2"  (1.575 m).   Weight as of this encounter: 154.1 kg.   Dysuria -Urinalysis unremarkable   DVT prophylaxis: Subcutaneous heparin Code Status: Full code Family Communication: None at bedside Disposition Plan: SNF placement pending   Consultants:   Orthopedic surgery, Dr. [name]  Antimicrobials:  Anti-infectives (From admission, onward)   Start     Dose/Rate Route Frequency Ordered Stop   02/07/19 1400   vancomycin (VANCOREADY) IVPB 1250 mg/250 mL  Status:  Discontinued     1,250 mg 166.7 mL/hr over 90 Minutes Intravenous Every 24 hours 02/07/19 1313 02/08/19 1456   02/05/19 1400  vancomycin (VANCOREADY) IVPB 2000 mg/400 mL  Status:  Discontinued     2,000 mg 200 mL/hr over 120 Minutes Intravenous Every 48 hours 02/03/19 1153 02/04/19 1323   02/05/19 1400  vancomycin (VANCOREADY) IVPB 1500 mg/300 mL  Status:  Discontinued     1,500 mg 150 mL/hr over 120 Minutes Intravenous Every 24 hours 02/05/19 1020 02/07/19 1313   02/04/19 1400  vancomycin (VANCOREADY) IVPB 1250 mg/250 mL  Status:  Discontinued     1,250 mg 166.7 mL/hr over 90 Minutes Intravenous Every 24 hours 02/04/19 1323 02/05/19 1020   02/03/19 1245  vancomycin (VANCOCIN) 2,500 mg in sodium chloride 0.9 % 500 mL IVPB     2,500 mg 250 mL/hr over 120 Minutes Intravenous  Once 02/03/19 1143 02/03/19 1434   02/02/19 2000  cefTRIAXone (ROCEPHIN) 2 g in sodium chloride 0.9 % 100 mL IVPB  Status:  Discontinued     2 g 200 mL/hr over 30 Minutes Intravenous Every 24 hours 02/02/19 1908 02/12/19 0814   02/02/19 1830  clindamycin (CLEOCIN) IVPB 600 mg     600 mg 100 mL/hr over 30 Minutes Intravenous  Once 02/02/19 1822 02/02/19 1921       Objective: Vitals:   02/15/19 2000 02/16/19 0340 02/16/19 0816 02/16/19 1120  BP: (!) 124/55 138/66 114/68   Pulse: 76 75 66 72  Resp: 18 17 17 16   Temp: 99.3 F (37.4 C) 99 F (37.2 C)    TempSrc: Oral     SpO2: 92% (!) 89% 94% 96%  Weight:      Height:        Intake/Output Summary (Last 24 hours) at 02/16/2019 1134 Last data filed at 02/16/2019 1033 Gross per 24 hour  Intake -  Output 1101 ml  Net -1101 ml   Filed Weights   02/10/19 0500 02/11/19 0500 02/14/19 0600  Weight: (!) 164.8 kg (!) 155.7 kg (!) 154.1 kg    Examination: General exam: Appears calm and comfortable  Respiratory system: Clear to auscultation. Respiratory effort normal. Cardiovascular system: S1 & S2 heard,  RRR. No pedal edema. Gastrointestinal system: Abdomen is nondistended, soft and nontender. Normal bowel sounds heard. Central nervous system: Alert and oriented. Non focal exam. Speech clear  Extremities: Symmetric in appearance bilaterally  Psychiatry: Judgement and insight appear stable. Mood & affect appropriate.   Data Reviewed: I have personally reviewed following labs and imaging studies  CBC: Recent Labs  Lab 02/10/19 0134 02/12/19 0257 02/13/19 0216 02/15/19 0433  WBC 6.5 7.5  --   --   HGB 11.2* 10.9* 11.3* 11.7*  HCT 35.2* 34.7* 36.3 37.0  MCV 95.1 94.8  --   --   PLT 231 235  --   --    Basic Metabolic Panel: Recent Labs  Lab 02/11/19 0518 02/11/19 0518 02/12/19 0257 02/13/19 0216 02/14/19 0402 02/15/19 0433 02/16/19 0405  NA 139   < > 136 136 138 135 137  K 3.9   < > 4.0 4.2 3.8 4.2 3.9  CL 98   < > 96* 94* 95* 92* 92*  CO2 32   < > 35* 33* 32 33* 34*  GLUCOSE 147*   < >  149* 132* 126* 151* 138*  BUN 28*   < > 28* 31* 30* 34* 30*  CREATININE 1.32*   < > 1.33* 1.25* 1.33* 1.47* 1.32*  CALCIUM 8.6*   < > 8.6* 9.0 8.6* 8.5* 8.8*  MG 2.2  --  2.3 2.2 2.2 2.2  --    < > = values in this interval not displayed.   GFR: Estimated Creatinine Clearance: 59 mL/min (A) (by C-G formula based on SCr of 1.32 mg/dL (H)). Liver Function Tests: No results for input(s): AST, ALT, ALKPHOS, BILITOT, PROT, ALBUMIN in the last 168 hours. No results for input(s): LIPASE, AMYLASE in the last 168 hours. No results for input(s): AMMONIA in the last 168 hours. Coagulation Profile: No results for input(s): INR, PROTIME in the last 168 hours. Cardiac Enzymes: No results for input(s): CKTOTAL, CKMB, CKMBINDEX, TROPONINI in the last 168 hours. BNP (last 3 results) No results for input(s): PROBNP in the last 8760 hours. HbA1C: No results for input(s): HGBA1C in the last 72 hours. CBG: Recent Labs  Lab 02/11/19 1138  GLUCAP 173*   Lipid Profile: No results for input(s): CHOL,  HDL, LDLCALC, TRIG, CHOLHDL, LDLDIRECT in the last 72 hours. Thyroid Function Tests: No results for input(s): TSH, T4TOTAL, FREET4, T3FREE, THYROIDAB in the last 72 hours. Anemia Panel: No results for input(s): VITAMINB12, FOLATE, FERRITIN, TIBC, IRON, RETICCTPCT in the last 72 hours. Sepsis Labs: No results for input(s): PROCALCITON, LATICACIDVEN in the last 168 hours.  Recent Results (from the past 240 hour(s))  SARS CORONAVIRUS 2 (TAT 6-24 HRS) Nasopharyngeal Nasopharyngeal Swab     Status: None   Collection Time: 02/08/19  2:09 PM   Specimen: Nasopharyngeal Swab  Result Value Ref Range Status   SARS Coronavirus 2 NEGATIVE NEGATIVE Final    Comment: (NOTE) SARS-CoV-2 target nucleic acids are NOT DETECTED. The SARS-CoV-2 RNA is generally detectable in upper and lower respiratory specimens during the acute phase of infection. Negative results do not preclude SARS-CoV-2 infection, do not rule out co-infections with other pathogens, and should not be used as the sole basis for treatment or other patient management decisions. Negative results must be combined with clinical observations, patient history, and epidemiological information. The expected result is Negative. Fact Sheet for Patients: SugarRoll.be Fact Sheet for Healthcare Providers: https://www.woods-mathews.com/ This test is not yet approved or cleared by the [place] FDA and  has been authorized for detection and/or diagnosis of SARS-CoV-2 by FDA under an Emergency Use Authorization (EUA). This EUA will remain  in effect (meaning this test can be used) for the duration of the COVID-19 declaration under Section 56 4(b)(1) of the Act, 21 U.S.C. section 360bbb-3(b)(1), unless the authorization is terminated or revoked sooner. Performed at [facility], [place] [address] [place], [place] [address]       Radiology Studies: No results found.    Scheduled Meds: .  allopurinol  100 mg Oral Daily  . aspirin EC  81 mg Oral QHS  . carvedilol  12.5 mg Oral BID WC  . colchicine  0.3 mg Oral Daily  . diclofenac sodium  2 g Topical QID  . famotidine  20 mg Oral BID  . ferrous sulfate  325 mg Oral Q breakfast  . fluticasone furoate-vilanterol  1 puff Inhalation Daily  . furosemide  120 mg Oral BID  . gabapentin  100 mg Oral Once  . gabapentin  200 mg Oral TID  . heparin  5,000 Units Subcutaneous Q8H  . hydrocortisone cream  Topical BID  . lactobacillus acidophilus  2 tablet Oral TID  . montelukast  10 mg Oral QHS  . Oxcarbazepine  300 mg Oral BID  . silver sulfADIAZINE  1 application Topical Daily  . simvastatin  40 mg Oral QHS  . spironolactone  25 mg Oral Daily   Continuous Infusions:   LOS: 14 days      Time spent: 20 minutes   Dessa Phi, DO [facility] 02/16/2019, 11:34 AM   Available via Epic secure chat 7am-7pm After these hours, please refer to coverage provider listed on [email or web address]

## 2019-02-16 NOTE — Progress Notes (Signed)
Physical Therapy Treatment Patient Details Name: Talullah Abate MRN: [id number] DOB: 08-Aug-1950 Today's Date: 02/16/2019    History of Present Illness Pt is a 69 y.o. female with medical history significant of hypertension, hyperlipidemia, Dm2, neuropathy, CKD stage 3, CAD , Asthma, morbid obesity, recent Covid infection in November, presented with bilateral lower extremity cellulitis and generalized weakness.    PT Comments    Pt remains incredibly limited secondary to generalized weakness, fatigue and some self-limiting behaviors. Continue to feel she is appropriate for SNF upon d/c for further rehab services prior to returning home. Pt unable to perform transfers at this time secondary to being NWB bilateral LEs, too weak to perform a scoot transfer and refusing use of the [place]ove at this time. PT will continue to monitor weekly to see if pt is able to make any progress.    Follow Up Recommendations  SNF     Equipment Recommendations  Other (comment)(defer to next venue of care)    Recommendations for Other Services       Precautions / Restrictions Precautions Precautions: Fall Restrictions Weight Bearing Restrictions: Yes RLE Weight Bearing: Non weight bearing LLE Weight Bearing: Non weight bearing Other Position/Activity Restrictions: Per Dr. Jess Barters consultation note    Mobility  Bed Mobility Overal bed mobility: Needs Assistance Bed Mobility: Supine to Sit;Sit to Supine     Supine to sit: +2 for physical assistance;Min assist Sit to supine: Min assist;+2 for physical assistance   General bed mobility comments: increased time and effort needed, use of bed rails, assistance to move bilateral LEs off of bed and use of bed rails to assist with trunk elevation  Transfers                 General transfer comment: pt unable to perform a scoot transfer at this time and would require the [place] (however, pt refusing)  Ambulation/Gait                  Stairs             Wheelchair Mobility    Modified Rankin (Stroke Patients Only)       Balance Overall balance assessment: Needs assistance Sitting-balance support: No upper extremity supported Sitting balance-Leahy Scale: Fair                                      Cognition Arousal/Alertness: Awake/alert Behavior During Therapy: WFL for tasks assessed/performed Overall Cognitive Status: Within Functional Limits for tasks assessed                                        Exercises General Exercises - Lower Extremity Ankle Circles/Pumps: AROM;Both;20 reps;Seated Gluteal Sets: AROM;Strengthening;10 reps;Supine Long Arc Quad: AAROM;Both;10 reps;Seated Heel Slides: AAROM;Both;10 reps;Supine Hip ABduction/ADduction: AAROM;Both;10 reps;Supine Other Exercises Other Exercises: bilateral scapular retractions in sitting x10 reps    General Comments        Pertinent Vitals/Pain Pain Assessment: Faces Faces Pain Scale: Hurts even more Pain Location: bilateral LEs, groin Pain Descriptors / Indicators: Grimacing;Guarding Pain Intervention(s): Monitored during session;Repositioned    Home Living                      Prior Function            PT Goals (current goals  can now be found in the care plan section) Acute Rehab PT Goals PT Goal Formulation: With patient Time For Goal Achievement: 02/17/19 Potential to Achieve Goals: Fair Progress towards PT goals: Progressing toward goals    Frequency    Min 1X/week      PT Plan Current plan remains appropriate    Co-evaluation              AM-PAC PT "6 Clicks" Mobility   Outcome Measure  Help needed turning from your back to your side while in a flat bed without using bedrails?: A Little Help needed moving from lying on your back to sitting on the side of a flat bed without using bedrails?: A Little Help needed moving to and from a bed to a chair (including a  wheelchair)?: Total Help needed standing up from a chair using your arms (e.g., wheelchair or bedside chair)?: Total Help needed to walk in hospital room?: Total Help needed climbing 3-5 steps with a railing? : Total 6 Click Score: 10    End of Session   Activity Tolerance: Patient limited by pain;Patient limited by fatigue Patient left: in bed;with call bell/phone within reach;with bed alarm set Nurse Communication: Mobility status PT Visit Diagnosis: Muscle weakness (generalized) (M62.81);Pain Pain - Right/Left: (bilateral) Pain - part of body: Leg     Time: 6144-3154 PT Time Calculation (min) (ACUTE ONLY): 21 min  Charges:  $Therapeutic Activity: 8-22 mins                     Anastasio Champion, DPT  Acute Rehabilitation Services Pager [phone] Office [place] 02/16/2019, 12:52 PM

## 2019-02-17 LAB — URINE CULTURE: Culture: 80000 — AB

## 2019-02-17 MED ORDER — CIPROFLOXACIN HCL 500 MG PO TABS
250.0000 mg | ORAL_TABLET | Freq: Two times a day (BID) | ORAL | Status: AC
  Administered 2019-02-17 – 2019-02-21 (×10): 250 mg via ORAL
  Filled 2019-02-17 (×10): qty 1

## 2019-02-17 NOTE — Progress Notes (Signed)
Pharmacy Antibiotic Note  April [name] is a 69 y.o. female admitted on 02/02/2019 with UTI.  Pharmacy has been consulted for Cipro dosing.  CrCl approx 60 ml/min  Plan: Cipro 250 mg po bid x 5 days  Height: 5\' 2"  (157.5 cm) Weight: (!) 339 lb 11.7 oz (154.1 kg) IBW/kg (Calculated) : 50.1  Temp (24hrs), Avg:98.9 F (37.2 C), Min:98.6 F (37 C), Max:99.3 F (37.4 C)  Recent Labs  Lab 02/12/19 0257 02/13/19 0216 02/14/19 0402 02/15/19 0433 02/16/19 0405  WBC 7.5  --   --   --   --   CREATININE 1.33* 1.25* 1.33* 1.47* 1.32*    Estimated Creatinine Clearance: 59 mL/min (A) (by C-G formula based on SCr of 1.32 mg/dL (H)).    Allergies  Allergen Reactions  . Penicillins Rash and Other (See Comments)    She was told not to take it anymore. Has patient had a PCN reaction causing immediate rash, facial/tongue/throat swelling, SOB or lightheadedness with hypotension: Yes Has patient had a PCN reaction causing severe rash involving mucus membranes or skin necrosis: No Has patient had a PCN reaction that required hospitalization: No Has patient had a PCN reaction occurring within the last 10 years: No If all of the above answers are "NO", then may proceed with Cephalosporin use.'  . Shellfish Allergy Anaphylaxis  . Sulfonamide Derivatives Anaphylaxis and Other (See Comments)  . Hydrochlorothiazide W-Triamterene Other (See Comments)    Hypokalemia  . Lotensin [Benazepril Hcl] Hives  . Dipyridamole Other (See Comments)    Unknown reaction  . Estrogens Other (See Comments)    Unknown reaction  . Hydrochlorothiazide Other (See Comments)    Unknown reaction  . Metronidazole Other (See Comments)    Unknown reaction  . Other Itching    PICKLES  . Spironolactone Other (See Comments)    UNSPECIFIED > "kidney problems"  . Sulfa Antibiotics Other (See Comments)    Unknown reaction  . Torsemide Other (See Comments)    Unknown reaction  . Valsartan Other (See Comments)   Unknown reaction  . Madelaine Bhat Isothiocyanate] Itching     Thank you for allowing pharmacy to be a part of this patient's care.  Alanda Slim, PharmD, [facility] Clinical Pharmacist Please see AMION for all Pharmacists' Contact Phone Numbers 02/17/2019, 12:03 PM

## 2019-02-17 NOTE — Progress Notes (Signed)
PROGRESS NOTE    April [name]  UDJ:[id number] DOB: [birth year] DOA: 02/02/2019 PCP: [name], MD     Brief Narrative:  April [name] is a 69 year old female with history of hypertension, hyperlipidemia, diabetes mellitus type 2, neuropathy, chronic kidney disease stage III, CAD, asthma, morbid obesity, recent Covid infection in November requiring hospitalization and treatment with steroids/remdesivir, chronic right shin wound, chronic ambulatory dysfunction presented with worsening weakness along with new pain and rash around the right shin ulcer area along with painful blister on the bottom of the left foot. She has now completed antibiotics for cellulitis.  New events last 24 hours / Subjective: Had some itching intermittently overnight.  Assessment & Plan:   Active Problems:   Neuropathy   Cellulitis   Non-pressure chronic ulcer of left heel and midfoot limited to breakdown of skin (HCC)   Idiopathic chronic gout of multiple sites without tophus   SOB (shortness of breath)   Acute idiopathic gout of right foot   Morbid obesity (HCC)   Cellulitis surrounding right chronic wound of the shin, POA/left plantar foot blister with possible cellulitis, POA -Patient admitted with cellulitis and plantar foot blister and placed on empiric IV vancomycin and IV Rocephin.  Plain films of bilateral feet as well as right tib-fib showed no acute lesions except some soft tissue swelling of the left foot.  Orthopedics was consulted patient seen in consultation by orthopedics who recommended nonweightbearing for both extremities and to continue antibiotics and transition to oral antibiotics on discharge.  MRSA nasal PCR was negative.  Patient completed 10-day antibiotics.    Acute gout flare -Patient with some exquisite tenderness to palpation on the right heel.  Patient with a history of uncontrolled gout per orthopedics.  Uric acid level was ordered which was elevated at 11.1.  Status  post Solu-Medrol 60 mg IV x1 on 02/08/2019.  Status post 5-day course of oral prednisone 40 mg daily. Continue allopurinol   Acute on chronic ambulatory dysfunction/generalized deconditioning -PT recommended SNF.  Social work consulted.  Acute kidney injury on chronic kidney disease stage IIIa -Baseline creatinine fluctuates between 1.2-1.5. Patient's diuretics resumed on 02/05/2019 at a lower dose of 80 mg twice daily and dose increased back to home regimen of 120 mg twice daily.  Spironolactone resumed -Creatinine remains stable  Chronic neuropathy -Patient still with complaints of heel pain which likely secondary to neuropathy.  Continue gabapentin which is renally dosed.  Increase gabapentin to 200 mg 3 times daily.  Discussed with pharmacist and could go up as high as 600 mg 3 times daily.  Patient states took Lyrica before in the past however hallucinated and as such discontinued.  Hypertension -Continue Coreg, spironolactone, Lasix  Hyperlipidemia -Continue Zocor  Asthma/COPD -Patient with some complaints of shortness of breath which has improved with diuresis. Chest x-ray with bibasilar ill-defined airspace opacity likely atypical organism pneumonia.  Lung sounds were clear.  Patient recently treated for COVID-19 in November 2020 and likely chest x-ray findings from prior COVID-19 infection.  Currently on room air  Morbid obesity Estimated body mass index is 62.14 kg/m as calculated from the following:   Height as of this encounter: 5\' 2"  (1.575 m).   Weight as of this encounter: 154.1 kg.   UTI, not POA  -Urinalysis unremarkable -Urine culture showing 80,000 Pseudomonas and Citrobacter -Cipro x 5 days    DVT prophylaxis: Subcutaneous heparin Code Status: Full code Family Communication: None at bedside Disposition Plan: SNF placement pending, remains difficult  to place   Consultants:   Orthopedic surgery, Dr. [name]  Antimicrobials:  Anti-infectives (From  admission, onward)   Start     Dose/Rate Route Frequency Ordered Stop   02/07/19 1400  vancomycin (VANCOREADY) IVPB 1250 mg/250 mL  Status:  Discontinued     1,250 mg 166.7 mL/hr over 90 Minutes Intravenous Every 24 hours 02/07/19 1313 02/08/19 1456   02/05/19 1400  vancomycin (VANCOREADY) IVPB 2000 mg/400 mL  Status:  Discontinued     2,000 mg 200 mL/hr over 120 Minutes Intravenous Every 48 hours 02/03/19 1153 02/04/19 1323   02/05/19 1400  vancomycin (VANCOREADY) IVPB 1500 mg/300 mL  Status:  Discontinued     1,500 mg 150 mL/hr over 120 Minutes Intravenous Every 24 hours 02/05/19 1020 02/07/19 1313   02/04/19 1400  vancomycin (VANCOREADY) IVPB 1250 mg/250 mL  Status:  Discontinued     1,250 mg 166.7 mL/hr over 90 Minutes Intravenous Every 24 hours 02/04/19 1323 02/05/19 1020   02/03/19 1245  vancomycin (VANCOCIN) 2,500 mg in sodium chloride 0.9 % 500 mL IVPB     2,500 mg 250 mL/hr over 120 Minutes Intravenous  Once 02/03/19 1143 02/03/19 1434   02/02/19 2000  cefTRIAXone (ROCEPHIN) 2 g in sodium chloride 0.9 % 100 mL IVPB  Status:  Discontinued     2 g 200 mL/hr over 30 Minutes Intravenous Every 24 hours 02/02/19 1908 02/12/19 0814   02/02/19 1830  clindamycin (CLEOCIN) IVPB 600 mg     600 mg 100 mL/hr over 30 Minutes Intravenous  Once 02/02/19 1822 02/02/19 1921       Objective: Vitals:   02/16/19 2033 02/17/19 0316 02/17/19 0804 02/17/19 0927  BP: (!) 93/55 133/63  (!) 118/59  Pulse: 87 70 72 79  Resp: 17 18 16 18   Temp: 98.6 F (37 C) 98.9 F (37.2 C)  98.8 F (37.1 C)  TempSrc: Oral Oral  Oral  SpO2: 96% 92% 92% 97%  Weight:      Height:        Intake/Output Summary (Last 24 hours) at 02/17/2019 1136 Last data filed at 02/17/2019 0048 Gross per 24 hour  Intake --  Output 2100 ml  Net -2100 ml   Filed Weights   02/10/19 0500 02/11/19 0500 02/14/19 0600  Weight: (!) 164.8 kg (!) 155.7 kg (!) 154.1 kg    Examination: General exam: Appears calm and comfortable   Respiratory system: Clear to auscultation. Respiratory effort normal. Cardiovascular system: S1 & S2 heard, RRR. No pedal edema. Gastrointestinal system: Abdomen is nondistended, soft and nontender. Normal bowel sounds heard. Central nervous system: Alert and oriented. Non focal exam. Speech clear  Extremities: Symmetric in appearance bilaterally  Psychiatry: Judgement and insight appear stable. Mood & affect appropriate.    Data Reviewed: I have personally reviewed following labs and imaging studies  CBC: Recent Labs  Lab 02/12/19 0257 02/13/19 0216 02/15/19 0433  WBC 7.5  --   --   HGB 10.9* 11.3* 11.7*  HCT 34.7* 36.3 37.0  MCV 94.8  --   --   PLT 235  --   --    Basic Metabolic Panel: Recent Labs  Lab 02/11/19 0518 02/11/19 0518 02/12/19 0257 02/13/19 0216 02/14/19 0402 02/15/19 0433 02/16/19 0405  NA 139   < > 136 136 138 135 137  K 3.9   < > 4.0 4.2 3.8 4.2 3.9  CL 98   < > 96* 94* 95* 92* 92*  CO2 32   < >  35* 33* 32 33* 34*  GLUCOSE 147*   < > 149* 132* 126* 151* 138*  BUN 28*   < > 28* 31* 30* 34* 30*  CREATININE 1.32*   < > 1.33* 1.25* 1.33* 1.47* 1.32*  CALCIUM 8.6*   < > 8.6* 9.0 8.6* 8.5* 8.8*  MG 2.2  --  2.3 2.2 2.2 2.2  --    < > = values in this interval not displayed.   GFR: Estimated Creatinine Clearance: 59 mL/min (A) (by C-G formula based on SCr of 1.32 mg/dL (H)). Liver Function Tests: No results for input(s): AST, ALT, ALKPHOS, BILITOT, PROT, ALBUMIN in the last 168 hours. No results for input(s): LIPASE, AMYLASE in the last 168 hours. No results for input(s): AMMONIA in the last 168 hours. Coagulation Profile: No results for input(s): INR, PROTIME in the last 168 hours. Cardiac Enzymes: No results for input(s): CKTOTAL, CKMB, CKMBINDEX, TROPONINI in the last 168 hours. BNP (last 3 results) No results for input(s): PROBNP in the last 8760 hours. HbA1C: No results for input(s): HGBA1C in the last 72 hours. CBG: Recent Labs  Lab  02/11/19 1138  GLUCAP 173*   Lipid Profile: No results for input(s): CHOL, HDL, LDLCALC, TRIG, CHOLHDL, LDLDIRECT in the last 72 hours. Thyroid Function Tests: No results for input(s): TSH, T4TOTAL, FREET4, T3FREE, THYROIDAB in the last 72 hours. Anemia Panel: No results for input(s): VITAMINB12, FOLATE, FERRITIN, TIBC, IRON, RETICCTPCT in the last 72 hours. Sepsis Labs: No results for input(s): PROCALCITON, LATICACIDVEN in the last 168 hours.  Recent Results (from the past 240 hour(s))  SARS CORONAVIRUS 2 (TAT 6-24 HRS) Nasopharyngeal Nasopharyngeal Swab     Status: None   Collection Time: 02/08/19  2:09 PM   Specimen: Nasopharyngeal Swab  Result Value Ref Range Status   SARS Coronavirus 2 NEGATIVE NEGATIVE Final    Comment: (NOTE) SARS-CoV-2 target nucleic acids are NOT DETECTED. The SARS-CoV-2 RNA is generally detectable in upper and lower respiratory specimens during the acute phase of infection. Negative results do not preclude SARS-CoV-2 infection, do not rule out co-infections with other pathogens, and should not be used as the sole basis for treatment or other patient management decisions. Negative results must be combined with clinical observations, patient history, and epidemiological information. The expected result is Negative. Fact Sheet for Patients: SugarRoll.be Fact Sheet for Healthcare Providers: https://www.woods-mathews.com/ This test is not yet approved or cleared by the [place] FDA and  has been authorized for detection and/or diagnosis of SARS-CoV-2 by FDA under an Emergency Use Authorization (EUA). This EUA will remain  in effect (meaning this test can be used) for the duration of the COVID-19 declaration under Section 56 4(b)(1) of the Act, 21 U.S.C. section 360bbb-3(b)(1), unless the authorization is terminated or revoked sooner. Performed at [facility], [place] [address] [place], [place] [address]    Culture, Urine     Status: Abnormal   Collection Time: 02/15/19  6:15 PM   Specimen: Urine, Random  Result Value Ref Range Status   Specimen Description URINE, RANDOM  Final   Special Requests   Final    NONE Performed at [facility], [place] [address] [place], [place] [address]    Culture (A)  Final    80,000 COLONIES/mL CITROBACTER FREUNDII 80,000 COLONIES/mL PSEUDOMONAS AERUGINOSA    Report Status 02/17/2019 FINAL  Final   Organism ID, Bacteria CITROBACTER FREUNDII (A)  Final   Organism ID, Bacteria PSEUDOMONAS AERUGINOSA (A)  Final  Susceptibility   Citrobacter freundii - MIC*    CEFAZOLIN >=64 RESISTANT Resistant     CEFTRIAXONE >=64 RESISTANT Resistant     CIPROFLOXACIN <=0.25 SENSITIVE Sensitive     GENTAMICIN <=1 SENSITIVE Sensitive     IMIPENEM <=0.25 SENSITIVE Sensitive     NITROFURANTOIN <=16 SENSITIVE Sensitive     TRIMETH/SULFA <=20 SENSITIVE Sensitive     PIP/TAZO 64 INTERMEDIATE Intermediate     * 80,000 COLONIES/mL CITROBACTER FREUNDII   Pseudomonas aeruginosa - MIC*    CEFTAZIDIME >=64 RESISTANT Resistant     CIPROFLOXACIN 1 SENSITIVE Sensitive     GENTAMICIN <=1 SENSITIVE Sensitive     IMIPENEM 2 SENSITIVE Sensitive     PIP/TAZO 32 SENSITIVE Sensitive     CEFEPIME 8 SENSITIVE Sensitive     * 80,000 COLONIES/mL PSEUDOMONAS AERUGINOSA      Radiology Studies: No results found.    Scheduled Meds: . allopurinol  100 mg Oral Daily  . aspirin EC  81 mg Oral QHS  . carvedilol  12.5 mg Oral BID WC  . colchicine  0.3 mg Oral Daily  . diclofenac sodium  2 g Topical QID  . famotidine  20 mg Oral BID  . ferrous sulfate  325 mg Oral Q breakfast  . fluticasone furoate-vilanterol  1 puff Inhalation Daily  . furosemide  120 mg Oral BID  . gabapentin  100 mg Oral Once  . gabapentin  200 mg Oral TID  . heparin  5,000 Units Subcutaneous Q8H  . hydrocortisone cream   Topical BID  . lactobacillus acidophilus  2 tablet Oral TID  . montelukast  10 mg  Oral QHS  . Oxcarbazepine  300 mg Oral BID  . silver sulfADIAZINE  1 application Topical Daily  . simvastatin  40 mg Oral QHS  . spironolactone  25 mg Oral Daily   Continuous Infusions:   LOS: 15 days      Time spent: 20 minutes   Dessa Phi, DO [facility] 02/17/2019, 11:36 AM   Available via Epic secure chat 7am-7pm After these hours, please refer to coverage provider listed on [email or web address]

## 2019-02-17 NOTE — Plan of Care (Signed)
  Problem: Clinical Measurements: Goal: Ability to maintain clinical measurements within normal limits will improve Outcome: Progressing   Problem: Health Behavior/Discharge Planning: Goal: Ability to manage health-related needs will improve Outcome: Progressing   Problem: Activity: Goal: Risk for activity intolerance will decrease Outcome: Progressing   Problem: Nutrition: Goal: Adequate nutrition will be maintained Outcome: Progressing   Problem: Elimination: Goal: Will not experience complications related to bowel motility Outcome: Progressing   Problem: Pain Managment: Goal: General experience of comfort will improve Outcome: Progressing   Problem: Safety: Goal: Ability to remain free from injury will improve Outcome: Progressing   Problem: Skin Integrity: Goal: Risk for impaired skin integrity will decrease Outcome: Progressing

## 2019-02-18 LAB — BASIC METABOLIC PANEL
Anion gap: 9 (ref 5–15)
BUN: 27 mg/dL — ABNORMAL HIGH (ref 8–23)
CO2: 35 mmol/L — ABNORMAL HIGH (ref 22–32)
Calcium: 8.8 mg/dL — ABNORMAL LOW (ref 8.9–10.3)
Chloride: 92 mmol/L — ABNORMAL LOW (ref 98–111)
Creatinine, Ser: 1.44 mg/dL — ABNORMAL HIGH (ref 0.44–1.00)
GFR calc Af Amer: 43 mL/min — ABNORMAL LOW (ref >60)
GFR calc non Af Amer: 37 mL/min — ABNORMAL LOW (ref >60)
Glucose, Bld: 127 mg/dL — ABNORMAL HIGH (ref 70–99)
Potassium: 4 mmol/L (ref 3.5–5.1)
Sodium: 136 mmol/L (ref 135–145)

## 2019-02-18 MED ORDER — ALBUTEROL SULFATE (2.5 MG/3ML) 0.083% IN NEBU
2.5000 mg | INHALATION_SOLUTION | RESPIRATORY_TRACT | Status: DC | PRN
  Administered 2019-02-19 – 2019-02-23 (×3): 2.5 mg via RESPIRATORY_TRACT
  Filled 2019-02-18 (×3): qty 3

## 2019-02-18 NOTE — Plan of Care (Signed)
  Problem: Skin Integrity: Goal: Risk for impaired skin integrity will decrease Outcome: Progressing   Problem: Safety: Goal: Ability to remain free from injury will improve Outcome: Progressing   Problem: Elimination: Goal: Will not experience complications related to bowel motility Outcome: Progressing   Problem: Nutrition: Goal: Adequate nutrition will be maintained Outcome: Progressing   Problem: Clinical Measurements: Goal: Will remain free from infection Outcome: Progressing

## 2019-02-18 NOTE — Progress Notes (Signed)
PROGRESS NOTE    April [name]  EZM:[id number] DOB: [birth year] DOA: 02/02/2019 PCP: [name], MD     Brief Narrative:  April [name] is a 69 year old female with history of hypertension, hyperlipidemia, diabetes mellitus type 2, neuropathy, chronic kidney disease stage III, CAD, asthma, morbid obesity, recent Covid infection in November requiring hospitalization and treatment with steroids/remdesivir, chronic right shin wound, chronic ambulatory dysfunction presented with worsening weakness along with new pain and rash around the right shin ulcer area along with painful blister on the bottom of the left foot. She has now completed antibiotics for cellulitis.  New events last 24 hours / Subjective: No new complaints.  States that she has some intermittent wheezing.  Assessment & Plan:   Active Problems:   Neuropathy   Cellulitis   Non-pressure chronic ulcer of left heel and midfoot limited to breakdown of skin (HCC)   Idiopathic chronic gout of multiple sites without tophus   SOB (shortness of breath)   Acute idiopathic gout of right foot   Morbid obesity (HCC)   Cellulitis surrounding right chronic wound of the shin, POA/left plantar foot blister with possible cellulitis, POA -Patient admitted with cellulitis and plantar foot blister and placed on empiric IV vancomycin and IV Rocephin.  Plain films of bilateral feet as well as right tib-fib showed no acute lesions except some soft tissue swelling of the left foot.  Orthopedics was consulted patient seen in consultation by orthopedics who recommended nonweightbearing for both extremities and to continue antibiotics and transition to oral antibiotics on discharge.  MRSA nasal PCR was negative.  Patient completed 10-day antibiotics.    Acute gout flare -Patient with some exquisite tenderness to palpation on the right heel.  Patient with a history of uncontrolled gout per orthopedics.  Uric acid level was ordered which was  elevated at 11.1.  Status post Solu-Medrol 60 mg IV x1 on 02/08/2019.  Status post 5-day course of oral prednisone 40 mg daily. Continue allopurinol   Acute on chronic ambulatory dysfunction/generalized deconditioning -PT recommended SNF.  Social work consulted.  Acute kidney injury on chronic kidney disease stage IIIa -Baseline creatinine fluctuates between 1.2-1.5. Patient's diuretics resumed on 02/05/2019 at a lower dose of 80 mg twice daily and dose increased back to home regimen of 120 mg twice daily.  Spironolactone resumed -Creatinine remains stable  Chronic neuropathy -Patient still with complaints of heel pain which likely secondary to neuropathy.  Continue gabapentin which is renally dosed.  Increase gabapentin to 200 mg 3 times daily.  Discussed with pharmacist and could go up as high as 600 mg 3 times daily.  Patient states took Lyrica before in the past however hallucinated and as such discontinued.  Hypertension -Continue Coreg, spironolactone, Lasix  Hyperlipidemia -Continue Zocor  Asthma/COPD -Patient with some complaints of shortness of breath which has improved with diuresis. Chest x-ray with bibasilar ill-defined airspace opacity likely atypical organism pneumonia.  Lung sounds were clear.  Patient recently treated for COVID-19 in November 2020 and likely chest x-ray findings from prior COVID-19 infection.  Currently on room air  Morbid obesity Estimated body mass index is 60.56 kg/m as calculated from the following:   Height as of this encounter: 5\' 2"  (1.575 m).   Weight as of this encounter: 150.2 kg.   UTI, not POA  -Urinalysis unremarkable -Urine culture showing 80,000 Pseudomonas and Citrobacter -Cipro x 5 days    DVT prophylaxis: Subcutaneous heparin Code Status: Full code Family Communication: None at bedside Disposition  Plan: SNF placement pending, remains difficult to place   Consultants:   Orthopedic surgery, Dr. [name]  Antimicrobials:   Anti-infectives (From admission, onward)   Start     Dose/Rate Route Frequency Ordered Stop   02/17/19 1215  ciprofloxacin (CIPRO) tablet 250 mg     250 mg Oral 2 times daily 02/17/19 1202 02/22/19 0759   02/07/19 1400  vancomycin (VANCOREADY) IVPB 1250 mg/250 mL  Status:  Discontinued     1,250 mg 166.7 mL/hr over 90 Minutes Intravenous Every 24 hours 02/07/19 1313 02/08/19 1456   02/05/19 1400  vancomycin (VANCOREADY) IVPB 2000 mg/400 mL  Status:  Discontinued     2,000 mg 200 mL/hr over 120 Minutes Intravenous Every 48 hours 02/03/19 1153 02/04/19 1323   02/05/19 1400  vancomycin (VANCOREADY) IVPB 1500 mg/300 mL  Status:  Discontinued     1,500 mg 150 mL/hr over 120 Minutes Intravenous Every 24 hours 02/05/19 1020 02/07/19 1313   02/04/19 1400  vancomycin (VANCOREADY) IVPB 1250 mg/250 mL  Status:  Discontinued     1,250 mg 166.7 mL/hr over 90 Minutes Intravenous Every 24 hours 02/04/19 1323 02/05/19 1020   02/03/19 1245  vancomycin (VANCOCIN) 2,500 mg in sodium chloride 0.9 % 500 mL IVPB     2,500 mg 250 mL/hr over 120 Minutes Intravenous  Once 02/03/19 1143 02/03/19 1434   02/02/19 2000  cefTRIAXone (ROCEPHIN) 2 g in sodium chloride 0.9 % 100 mL IVPB  Status:  Discontinued     2 g 200 mL/hr over 30 Minutes Intravenous Every 24 hours 02/02/19 1908 02/12/19 0814   02/02/19 1830  clindamycin (CLEOCIN) IVPB 600 mg     600 mg 100 mL/hr over 30 Minutes Intravenous  Once 02/02/19 1822 02/02/19 1921       Objective: Vitals:   02/17/19 2053 02/18/19 0330 02/18/19 0500 02/18/19 0752  BP: (!) 110/54 120/63  124/60  Pulse: 79 68  67  Resp: 19 19  17   Temp: 98.6 F (37 C) 98.4 F (36.9 C)  98.2 F (36.8 C)  TempSrc: Oral Oral  Oral  SpO2: 93% 94%  90%  Weight:   (!) 150.2 kg   Height:        Intake/Output Summary (Last 24 hours) at 02/18/2019 1424 Last data filed at 02/18/2019 0500 Gross per 24 hour  Intake 240 ml  Output 2550 ml  Net -2310 ml   Filed Weights   02/11/19  0500 02/14/19 0600 02/18/19 0500  Weight: (!) 155.7 kg (!) 154.1 kg (!) 150.2 kg    Examination: General exam: Appears calm and comfortable  Respiratory system: Clear to auscultation. Respiratory effort normal. Cardiovascular system: S1 & S2 heard, RRR. No pedal edema. Gastrointestinal system: Abdomen is nondistended, soft and nontender. Normal bowel sounds heard. Central nervous system: Alert and oriented. Non focal exam. Speech clear  Extremities: Symmetric in appearance bilaterally  Skin: Right shin with healing ulcer, minimal drainage.  Left foot plantar heel with eschar Psychiatry: Judgement and insight appear stable. Mood & affect appropriate.   Data Reviewed: I have personally reviewed following labs and imaging studies  CBC: Recent Labs  Lab 02/12/19 0257 02/13/19 0216 02/15/19 0433  WBC 7.5  --   --   HGB 10.9* 11.3* 11.7*  HCT 34.7* 36.3 37.0  MCV 94.8  --   --   PLT 235  --   --    Basic Metabolic Panel: Recent Labs  Lab 02/12/19 0257 02/12/19 0257 02/13/19 0216 02/14/19 0402 02/15/19 5462  02/16/19 0405 02/18/19 0244  NA 136   < > 136 138 135 137 136  K 4.0   < > 4.2 3.8 4.2 3.9 4.0  CL 96*   < > 94* 95* 92* 92* 92*  CO2 35*   < > 33* 32 33* 34* 35*  GLUCOSE 149*   < > 132* 126* 151* 138* 127*  BUN 28*   < > 31* 30* 34* 30* 27*  CREATININE 1.33*   < > 1.25* 1.33* 1.47* 1.32* 1.44*  CALCIUM 8.6*   < > 9.0 8.6* 8.5* 8.8* 8.8*  MG 2.3  --  2.2 2.2 2.2  --   --    < > = values in this interval not displayed.   GFR: Estimated Creatinine Clearance: 53.2 mL/min (A) (by C-G formula based on SCr of 1.44 mg/dL (H)). Liver Function Tests: No results for input(s): AST, ALT, ALKPHOS, BILITOT, PROT, ALBUMIN in the last 168 hours. No results for input(s): LIPASE, AMYLASE in the last 168 hours. No results for input(s): AMMONIA in the last 168 hours. Coagulation Profile: No results for input(s): INR, PROTIME in the last 168 hours. Cardiac Enzymes: No results for  input(s): CKTOTAL, CKMB, CKMBINDEX, TROPONINI in the last 168 hours. BNP (last 3 results) No results for input(s): PROBNP in the last 8760 hours. HbA1C: No results for input(s): HGBA1C in the last 72 hours. CBG: No results for input(s): GLUCAP in the last 168 hours. Lipid Profile: No results for input(s): CHOL, HDL, LDLCALC, TRIG, CHOLHDL, LDLDIRECT in the last 72 hours. Thyroid Function Tests: No results for input(s): TSH, T4TOTAL, FREET4, T3FREE, THYROIDAB in the last 72 hours. Anemia Panel: No results for input(s): VITAMINB12, FOLATE, FERRITIN, TIBC, IRON, RETICCTPCT in the last 72 hours. Sepsis Labs: No results for input(s): PROCALCITON, LATICACIDVEN in the last 168 hours.  Recent Results (from the past 240 hour(s))  Culture, Urine     Status: Abnormal   Collection Time: 02/15/19  6:15 PM   Specimen: Urine, Random  Result Value Ref Range Status   Specimen Description URINE, RANDOM  Final   Special Requests   Final    NONE Performed at [facility], [address], [place], [place] [address]    Culture (A)  Final    80,000 COLONIES/mL CITROBACTER FREUNDII 80,000 COLONIES/mL PSEUDOMONAS AERUGINOSA    Report Status 02/17/2019 FINAL  Final   Organism ID, Bacteria CITROBACTER FREUNDII (A)  Final   Organism ID, Bacteria PSEUDOMONAS AERUGINOSA (A)  Final      Susceptibility   Citrobacter freundii - MIC*    CEFAZOLIN >=64 RESISTANT Resistant     CEFTRIAXONE >=64 RESISTANT Resistant     CIPROFLOXACIN <=0.25 SENSITIVE Sensitive     GENTAMICIN <=1 SENSITIVE Sensitive     IMIPENEM <=0.25 SENSITIVE Sensitive     NITROFURANTOIN <=16 SENSITIVE Sensitive     TRIMETH/SULFA <=20 SENSITIVE Sensitive     PIP/TAZO 64 INTERMEDIATE Intermediate     * 80,000 COLONIES/mL CITROBACTER FREUNDII   Pseudomonas aeruginosa - MIC*    CEFTAZIDIME >=64 RESISTANT Resistant     CIPROFLOXACIN 1 SENSITIVE Sensitive     GENTAMICIN <=1 SENSITIVE Sensitive     IMIPENEM 2 SENSITIVE Sensitive      PIP/TAZO 32 SENSITIVE Sensitive     CEFEPIME 8 SENSITIVE Sensitive     * 80,000 COLONIES/mL PSEUDOMONAS AERUGINOSA      Radiology Studies: No results found.    Scheduled Meds: . allopurinol  100 mg Oral Daily  . aspirin  EC  81 mg Oral QHS  . carvedilol  12.5 mg Oral BID WC  . ciprofloxacin  250 mg Oral BID  . colchicine  0.3 mg Oral Daily  . diclofenac sodium  2 g Topical QID  . famotidine  20 mg Oral BID  . ferrous sulfate  325 mg Oral Q breakfast  . fluticasone furoate-vilanterol  1 puff Inhalation Daily  . furosemide  120 mg Oral BID  . gabapentin  100 mg Oral Once  . gabapentin  200 mg Oral TID  . heparin  5,000 Units Subcutaneous Q8H  . hydrocortisone cream   Topical BID  . lactobacillus acidophilus  2 tablet Oral TID  . montelukast  10 mg Oral QHS  . Oxcarbazepine  300 mg Oral BID  . silver sulfADIAZINE  1 application Topical Daily  . simvastatin  40 mg Oral QHS  . spironolactone  25 mg Oral Daily   Continuous Infusions:   LOS: 16 days      Time spent: 20 minutes   Dessa Phi, DO [facility] 02/18/2019, 2:24 PM   Available via Epic secure chat 7am-7pm After these hours, please refer to coverage provider listed on [email or web address]

## 2019-02-19 MED ORDER — ONDANSETRON HCL 4 MG/2ML IJ SOLN
4.0000 mg | Freq: Four times a day (QID) | INTRAMUSCULAR | Status: DC | PRN
  Administered 2019-02-19: 14:00:00 4 mg via INTRAVENOUS
  Filled 2019-02-19: qty 2

## 2019-02-19 NOTE — Plan of Care (Signed)
  Problem: Skin Integrity: Goal: Risk for impaired skin integrity will decrease Outcome: Progressing   Problem: Pain Managment: Goal: General experience of comfort will improve Outcome: Progressing   Problem: Elimination: Goal: Will not experience complications related to bowel motility Outcome: Progressing   Problem: Nutrition: Goal: Adequate nutrition will be maintained Outcome: Progressing   Problem: Clinical Measurements: Goal: Will remain free from infection Outcome: Progressing

## 2019-02-19 NOTE — Plan of Care (Signed)

## 2019-02-19 NOTE — Progress Notes (Signed)
PROGRESS NOTE    April [name]  HMC:[id number] DOB: [birth year] DOA: 02/02/2019 PCP: [name], MD     Brief Narrative:  April [name] is a 69 year old female with history of hypertension, hyperlipidemia, diabetes mellitus type 2, neuropathy, chronic kidney disease stage III, CAD, asthma, morbid obesity, recent Covid infection in November requiring hospitalization and treatment with steroids/remdesivir, chronic right shin wound, chronic ambulatory dysfunction presented with worsening weakness along with new pain and rash around the right shin ulcer area along with painful blister on the bottom of the left foot. She has now completed antibiotics for cellulitis.  New events last 24 hours / Subjective: Complains of bilateral knee pains, voltaren gel seems to help.   Assessment & Plan:   Active Problems:   Neuropathy   Cellulitis   Non-pressure chronic ulcer of left heel and midfoot limited to breakdown of skin (HCC)   Idiopathic chronic gout of multiple sites without tophus   SOB (shortness of breath)   Acute idiopathic gout of right foot   Morbid obesity (HCC)   Cellulitis surrounding right chronic wound of the shin, POA/left plantar foot blister with possible cellulitis, POA -Patient admitted with cellulitis and plantar foot blister and placed on empiric IV vancomycin and IV Rocephin.  Plain films of bilateral feet as well as right tib-fib showed no acute lesions except some soft tissue swelling of the left foot.  Orthopedics was consulted patient seen in consultation by orthopedics who recommended nonweightbearing for both extremities and to continue antibiotics and transition to oral antibiotics on discharge.  MRSA nasal PCR was negative.  Patient completed 10-day antibiotics.    Acute gout flare -Patient with some exquisite tenderness to palpation on the right heel.  Patient with a history of uncontrolled gout per orthopedics.  Uric acid level was ordered which was  elevated at 11.1.  Status post Solu-Medrol 60 mg IV x1 on 02/08/2019.  Status post 5-day course of oral prednisone 40 mg daily. Continue allopurinol   Acute on chronic ambulatory dysfunction/generalized deconditioning -PT recommended SNF.  Social work consulted.  Acute kidney injury on chronic kidney disease stage IIIa -Baseline creatinine fluctuates between 1.2-1.5. Patient's diuretics resumed on 02/05/2019 at a lower dose of 80 mg twice daily and dose increased back to home regimen of 120 mg twice daily.  Spironolactone resumed -Creatinine remains stable  Chronic neuropathy -Patient still with complaints of heel pain which likely secondary to neuropathy.  Continue gabapentin which is renally dosed.  Increase gabapentin to 200 mg 3 times daily.  Discussed with pharmacist and could go up as high as 600 mg 3 times daily.  Patient states took Lyrica before in the past however hallucinated and as such discontinued.  Hypertension -Continue Coreg, spironolactone, Lasix  Hyperlipidemia -Continue Zocor  Asthma/COPD -Patient with some complaints of shortness of breath which has improved with diuresis. Chest x-ray with bibasilar ill-defined airspace opacity likely atypical organism pneumonia.  Lung sounds were clear.  Patient recently treated for COVID-19 in November 2020 and likely chest x-ray findings from prior COVID-19 infection.  Currently on room air  Morbid obesity Estimated body mass index is 60.56 kg/m as calculated from the following:   Height as of this encounter: 5\' 2"  (1.575 m).   Weight as of this encounter: 150.2 kg.   UTI, not POA  -Urinalysis unremarkable -Urine culture showing 80,000 Pseudomonas and Citrobacter -Cipro x 5 days    DVT prophylaxis: Subcutaneous heparin Code Status: Full code Family Communication: None at bedside Disposition  Plan: SNF placement pending, remains difficult to place   Consultants:   Orthopedic surgery, Dr. [name]  Antimicrobials:    Anti-infectives (From admission, onward)   Start     Dose/Rate Route Frequency Ordered Stop   02/17/19 1215  ciprofloxacin (CIPRO) tablet 250 mg     250 mg Oral 2 times daily 02/17/19 1202 02/22/19 0759   02/07/19 1400  vancomycin (VANCOREADY) IVPB 1250 mg/250 mL  Status:  Discontinued     1,250 mg 166.7 mL/hr over 90 Minutes Intravenous Every 24 hours 02/07/19 1313 02/08/19 1456   02/05/19 1400  vancomycin (VANCOREADY) IVPB 2000 mg/400 mL  Status:  Discontinued     2,000 mg 200 mL/hr over 120 Minutes Intravenous Every 48 hours 02/03/19 1153 02/04/19 1323   02/05/19 1400  vancomycin (VANCOREADY) IVPB 1500 mg/300 mL  Status:  Discontinued     1,500 mg 150 mL/hr over 120 Minutes Intravenous Every 24 hours 02/05/19 1020 02/07/19 1313   02/04/19 1400  vancomycin (VANCOREADY) IVPB 1250 mg/250 mL  Status:  Discontinued     1,250 mg 166.7 mL/hr over 90 Minutes Intravenous Every 24 hours 02/04/19 1323 02/05/19 1020   02/03/19 1245  vancomycin (VANCOCIN) 2,500 mg in sodium chloride 0.9 % 500 mL IVPB     2,500 mg 250 mL/hr over 120 Minutes Intravenous  Once 02/03/19 1143 02/03/19 1434   02/02/19 2000  cefTRIAXone (ROCEPHIN) 2 g in sodium chloride 0.9 % 100 mL IVPB  Status:  Discontinued     2 g 200 mL/hr over 30 Minutes Intravenous Every 24 hours 02/02/19 1908 02/12/19 0814   02/02/19 1830  clindamycin (CLEOCIN) IVPB 600 mg     600 mg 100 mL/hr over 30 Minutes Intravenous  Once 02/02/19 1822 02/02/19 1921       Objective: Vitals:   02/18/19 1511 02/18/19 1944 02/19/19 0336 02/19/19 0749  BP: (!) 125/59 (!) 110/53 117/62 127/74  Pulse: 74 78 67 73  Resp: 17 16 14 16   Temp: 97.9 F (36.6 C) 98.7 F (37.1 C) 98.3 F (36.8 C) 98.3 F (36.8 C)  TempSrc: Oral Oral Oral Oral  SpO2: 91% 94% 92% (!) 87%  Weight:      Height:        Intake/Output Summary (Last 24 hours) at 02/19/2019 1109 Last data filed at 02/19/2019 1100 Gross per 24 hour  Intake 480 ml  Output 400 ml  Net 80 ml    Filed Weights   02/11/19 0500 02/14/19 0600 02/18/19 0500  Weight: (!) 155.7 kg (!) 154.1 kg (!) 150.2 kg     Examination: General exam: Appears calm and comfortable  Respiratory system: Clear to auscultation. Respiratory effort normal. Cardiovascular system: S1 & S2 heard, RRR. No pedal edema. Gastrointestinal system: Abdomen is nondistended, soft and nontender. Normal bowel sounds heard. Central nervous system: Alert and oriented. Non focal exam. Speech clear  Extremities: Symmetric in appearance bilaterally  Psychiatry: Judgement and insight appear stable. Mood & affect appropriate.     Data Reviewed: I have personally reviewed following labs and imaging studies  CBC: Recent Labs  Lab 02/13/19 0216 02/15/19 0433  HGB 11.3* 11.7*  HCT 36.3 54.2   Basic Metabolic Panel: Recent Labs  Lab 02/13/19 0216 02/14/19 0402 02/15/19 0433 02/16/19 0405 02/18/19 0244  NA 136 138 135 137 136  K 4.2 3.8 4.2 3.9 4.0  CL 94* 95* 92* 92* 92*  CO2 33* 32 33* 34* 35*  GLUCOSE 132* 126* 151* 138* 127*  BUN 31* 30*  34* 30* 27*  CREATININE 1.25* 1.33* 1.47* 1.32* 1.44*  CALCIUM 9.0 8.6* 8.5* 8.8* 8.8*  MG 2.2 2.2 2.2  --   --    GFR: Estimated Creatinine Clearance: 53.2 mL/min (A) (by C-G formula based on SCr of 1.44 mg/dL (H)). Liver Function Tests: No results for input(s): AST, ALT, ALKPHOS, BILITOT, PROT, ALBUMIN in the last 168 hours. No results for input(s): LIPASE, AMYLASE in the last 168 hours. No results for input(s): AMMONIA in the last 168 hours. Coagulation Profile: No results for input(s): INR, PROTIME in the last 168 hours. Cardiac Enzymes: No results for input(s): CKTOTAL, CKMB, CKMBINDEX, TROPONINI in the last 168 hours. BNP (last 3 results) No results for input(s): PROBNP in the last 8760 hours. HbA1C: No results for input(s): HGBA1C in the last 72 hours. CBG: No results for input(s): GLUCAP in the last 168 hours. Lipid Profile: No results for input(s):  CHOL, HDL, LDLCALC, TRIG, CHOLHDL, LDLDIRECT in the last 72 hours. Thyroid Function Tests: No results for input(s): TSH, T4TOTAL, FREET4, T3FREE, THYROIDAB in the last 72 hours. Anemia Panel: No results for input(s): VITAMINB12, FOLATE, FERRITIN, TIBC, IRON, RETICCTPCT in the last 72 hours. Sepsis Labs: No results for input(s): PROCALCITON, LATICACIDVEN in the last 168 hours.  Recent Results (from the past 240 hour(s))  Culture, Urine     Status: Abnormal   Collection Time: 02/15/19  6:15 PM   Specimen: Urine, Random  Result Value Ref Range Status   Specimen Description URINE, RANDOM  Final   Special Requests   Final    NONE Performed at [facility], [address], [place], [place] [address]    Culture (A)  Final    80,000 COLONIES/mL CITROBACTER FREUNDII 80,000 COLONIES/mL PSEUDOMONAS AERUGINOSA    Report Status 02/17/2019 FINAL  Final   Organism ID, Bacteria CITROBACTER FREUNDII (A)  Final   Organism ID, Bacteria PSEUDOMONAS AERUGINOSA (A)  Final      Susceptibility   Citrobacter freundii - MIC*    CEFAZOLIN >=64 RESISTANT Resistant     CEFTRIAXONE >=64 RESISTANT Resistant     CIPROFLOXACIN <=0.25 SENSITIVE Sensitive     GENTAMICIN <=1 SENSITIVE Sensitive     IMIPENEM <=0.25 SENSITIVE Sensitive     NITROFURANTOIN <=16 SENSITIVE Sensitive     TRIMETH/SULFA <=20 SENSITIVE Sensitive     PIP/TAZO 64 INTERMEDIATE Intermediate     * 80,000 COLONIES/mL CITROBACTER FREUNDII   Pseudomonas aeruginosa - MIC*    CEFTAZIDIME >=64 RESISTANT Resistant     CIPROFLOXACIN 1 SENSITIVE Sensitive     GENTAMICIN <=1 SENSITIVE Sensitive     IMIPENEM 2 SENSITIVE Sensitive     PIP/TAZO 32 SENSITIVE Sensitive     CEFEPIME 8 SENSITIVE Sensitive     * 80,000 COLONIES/mL PSEUDOMONAS AERUGINOSA      Radiology Studies: No results found.    Scheduled Meds: . allopurinol  100 mg Oral Daily  . aspirin EC  81 mg Oral QHS  . carvedilol  12.5 mg Oral BID WC  . ciprofloxacin  250 mg Oral  BID  . colchicine  0.3 mg Oral Daily  . diclofenac sodium  2 g Topical QID  . famotidine  20 mg Oral BID  . ferrous sulfate  325 mg Oral Q breakfast  . fluticasone furoate-vilanterol  1 puff Inhalation Daily  . furosemide  120 mg Oral BID  . gabapentin  100 mg Oral Once  . gabapentin  200 mg Oral TID  . heparin  5,000 Units Subcutaneous  Q8H  . hydrocortisone cream   Topical BID  . lactobacillus acidophilus  2 tablet Oral TID  . montelukast  10 mg Oral QHS  . Oxcarbazepine  300 mg Oral BID  . silver sulfADIAZINE  1 application Topical Daily  . simvastatin  40 mg Oral QHS  . spironolactone  25 mg Oral Daily   Continuous Infusions:   LOS: 17 days      Time spent: 15 minutes   April Phi, DO [facility] 02/19/2019, 11:09 AM   Available via Epic secure chat 7am-7pm After these hours, please refer to coverage provider listed on [email or web address]

## 2019-02-20 MED ORDER — GABAPENTIN 300 MG PO CAPS
300.0000 mg | ORAL_CAPSULE | Freq: Three times a day (TID) | ORAL | Status: DC
  Administered 2019-02-20 – 2019-02-22 (×7): 300 mg via ORAL
  Filled 2019-02-20 (×7): qty 1

## 2019-02-20 NOTE — Plan of Care (Signed)
  Problem: Education: Goal: Knowledge of General Education information will improve Description: Including pain rating scale, medication(s)/side effects and non-pharmacologic comfort measures Outcome: Adequate for Discharge   

## 2019-02-20 NOTE — Progress Notes (Signed)
PROGRESS NOTE    April [name]  DTO:[id number] DOB: [birth year] DOA: 02/02/2019 PCP: [name], MD     Brief Narrative:  April [name] is a 69 year old female with history of hypertension, hyperlipidemia, diabetes mellitus type 2, neuropathy, chronic kidney disease stage III, CAD, asthma, morbid obesity, recent Covid infection in November requiring hospitalization and treatment with steroids/remdesivir, chronic right shin wound, chronic ambulatory dysfunction presented with worsening weakness along with new pain and rash around the right shin ulcer area along with painful blister on the bottom of the left foot. She has now completed antibiotics for cellulitis.  New events last 24 hours / Subjective: Has some knee pain, having heat pack helps, continues to have burning pain in her leg   Assessment & Plan:   Active Problems:   Neuropathy   Cellulitis   Non-pressure chronic ulcer of left heel and midfoot limited to breakdown of skin (HCC)   Idiopathic chronic gout of multiple sites without tophus   SOB (shortness of breath)   Acute idiopathic gout of right foot   Morbid obesity (HCC)   Cellulitis surrounding right chronic wound of the shin, POA/left plantar foot blister with possible cellulitis, POA  -Patient admitted with cellulitis and plantar foot blister and placed on empiric IV vancomycin and IV Rocephin.  Plain films of bilateral feet as well as right tib-fib showed no acute lesions except some soft tissue swelling of the left foot.  Orthopedics was consulted patient seen in consultation by orthopedics who recommended nonweightbearing for both extremities and to continue antibiotics and transition to oral antibiotics on discharge.  MRSA nasal PCR was negative.  Patient completed 10-day antibiotics.    Acute gout flare -Patient with some exquisite tenderness to palpation on the right heel.  Patient with a history of uncontrolled gout per orthopedics.  Uric acid level was  ordered which was elevated at 11.1.  Status post Solu-Medrol 60 mg IV x1 on 02/08/2019.  Status post 5-day course of oral prednisone 40 mg daily. Continue allopurinol   Acute on chronic ambulatory dysfunction/generalized deconditioning -PT recommended SNF.  Social work consulted.  Acute kidney injury on chronic kidney disease stage IIIa -Baseline creatinine fluctuates between 1.2-1.5. Patient's diuretics resumed on 02/05/2019 at a lower dose of 80 mg twice daily and dose increased back to home regimen of 120 mg twice daily.  Spironolactone resumed -Creatinine remains stable  Chronic neuropathy -Patient still with complaints of heel pain which likely secondary to neuropathy.  Continue gabapentin which is renally dosed.  Increase gabapentin to 300 mg 3 times daily. Could go up as high as 600 mg 3 times daily.  Patient states took Lyrica before in the past however hallucinated and as such discontinued.  Hypertension -Continue Coreg, spironolactone, Lasix  Hyperlipidemia -Continue Zocor  Asthma/COPD -Patient with some complaints of shortness of breath which has improved with diuresis. Chest x-ray with bibasilar ill-defined airspace opacity likely atypical organism pneumonia.  Lung sounds were clear.  Patient recently treated for COVID-19 in November 2020 and likely chest x-ray findings from prior COVID-19 infection.  Currently on room air  Morbid obesity Estimated body mass index is 60.56 kg/m as calculated from the following:   Height as of this encounter: 5\' 2"  (1.575 m).   Weight as of this encounter: 150.2 kg.   UTI, not POA  -Urinalysis unremarkable -Urine culture showing 80,000 Pseudomonas and Citrobacter -Cipro x 5 days    DVT prophylaxis: Subcutaneous heparin Code Status: Full code Family Communication: None at  bedside Disposition Plan: SNF placement pending, remains difficult to place   Consultants:   Orthopedic surgery, Dr. [name]  Antimicrobials:  Anti-infectives  (From admission, onward)   Start     Dose/Rate Route Frequency Ordered Stop   02/17/19 1215  ciprofloxacin (CIPRO) tablet 250 mg     250 mg Oral 2 times daily 02/17/19 1202 02/22/19 0759   02/07/19 1400  vancomycin (VANCOREADY) IVPB 1250 mg/250 mL  Status:  Discontinued     1,250 mg 166.7 mL/hr over 90 Minutes Intravenous Every 24 hours 02/07/19 1313 02/08/19 1456   02/05/19 1400  vancomycin (VANCOREADY) IVPB 2000 mg/400 mL  Status:  Discontinued     2,000 mg 200 mL/hr over 120 Minutes Intravenous Every 48 hours 02/03/19 1153 02/04/19 1323   02/05/19 1400  vancomycin (VANCOREADY) IVPB 1500 mg/300 mL  Status:  Discontinued     1,500 mg 150 mL/hr over 120 Minutes Intravenous Every 24 hours 02/05/19 1020 02/07/19 1313   02/04/19 1400  vancomycin (VANCOREADY) IVPB 1250 mg/250 mL  Status:  Discontinued     1,250 mg 166.7 mL/hr over 90 Minutes Intravenous Every 24 hours 02/04/19 1323 02/05/19 1020   02/03/19 1245  vancomycin (VANCOCIN) 2,500 mg in sodium chloride 0.9 % 500 mL IVPB     2,500 mg 250 mL/hr over 120 Minutes Intravenous  Once 02/03/19 1143 02/03/19 1434   02/02/19 2000  cefTRIAXone (ROCEPHIN) 2 g in sodium chloride 0.9 % 100 mL IVPB  Status:  Discontinued     2 g 200 mL/hr over 30 Minutes Intravenous Every 24 hours 02/02/19 1908 02/12/19 0814   02/02/19 1830  clindamycin (CLEOCIN) IVPB 600 mg     600 mg 100 mL/hr over 30 Minutes Intravenous  Once 02/02/19 1822 02/02/19 1921       Objective: Vitals:   02/20/19 0100 02/20/19 0330 02/20/19 0719 02/20/19 0752  BP:  116/60  (!) 91/53  Pulse:  72 78 81  Resp: 16 16 18 19   Temp:  98.5 F (36.9 C)  98.8 F (37.1 C)  TempSrc:  Oral  Oral  SpO2:  (!) 89%  92%  Weight:      Height:        Intake/Output Summary (Last 24 hours) at 02/20/2019 0811 Last data filed at 02/20/2019 0330 Gross per 24 hour  Intake 680 ml  Output 1750 ml  Net -1070 ml   Filed Weights   02/11/19 0500 02/14/19 0600 02/18/19 0500  Weight: (!) 155.7 kg  (!) 154.1 kg (!) 150.2 kg     Examination: General exam: Appears calm and comfortable  Respiratory system: Clear to auscultation. Respiratory effort normal. Cardiovascular system: S1 & S2 heard, RRR. No pedal edema. Gastrointestinal system: Abdomen is nondistended, soft and nontender. Normal bowel sounds heard. Central nervous system: Alert and oriented. Non focal exam. Speech clear  Extremities: Symmetric in appearance bilaterally  Skin: Right shin with improvement in cellulitis, wound without drainage  Psychiatry: Judgement and insight appear stable. Mood & affect appropriate.     Data Reviewed: I have personally reviewed following labs and imaging studies  CBC: Recent Labs  Lab 02/15/19 0433  HGB 11.7*  HCT 86.7   Basic Metabolic Panel: Recent Labs  Lab 02/14/19 0402 02/15/19 0433 02/16/19 0405 02/18/19 0244  NA 138 135 137 136  K 3.8 4.2 3.9 4.0  CL 95* 92* 92* 92*  CO2 32 33* 34* 35*  GLUCOSE 126* 151* 138* 127*  BUN 30* 34* 30* 27*  CREATININE 1.33* 1.47*  1.32* 1.44*  CALCIUM 8.6* 8.5* 8.8* 8.8*  MG 2.2 2.2  --   --    GFR: Estimated Creatinine Clearance: 53.2 mL/min (A) (by C-G formula based on SCr of 1.44 mg/dL (H)). Liver Function Tests: No results for input(s): AST, ALT, ALKPHOS, BILITOT, PROT, ALBUMIN in the last 168 hours. No results for input(s): LIPASE, AMYLASE in the last 168 hours. No results for input(s): AMMONIA in the last 168 hours. Coagulation Profile: No results for input(s): INR, PROTIME in the last 168 hours. Cardiac Enzymes: No results for input(s): CKTOTAL, CKMB, CKMBINDEX, TROPONINI in the last 168 hours. BNP (last 3 results) No results for input(s): PROBNP in the last 8760 hours. HbA1C: No results for input(s): HGBA1C in the last 72 hours. CBG: No results for input(s): GLUCAP in the last 168 hours. Lipid Profile: No results for input(s): CHOL, HDL, LDLCALC, TRIG, CHOLHDL, LDLDIRECT in the last 72 hours. Thyroid Function  Tests: No results for input(s): TSH, T4TOTAL, FREET4, T3FREE, THYROIDAB in the last 72 hours. Anemia Panel: No results for input(s): VITAMINB12, FOLATE, FERRITIN, TIBC, IRON, RETICCTPCT in the last 72 hours. Sepsis Labs: No results for input(s): PROCALCITON, LATICACIDVEN in the last 168 hours.  Recent Results (from the past 240 hour(s))  Culture, Urine     Status: Abnormal   Collection Time: 02/15/19  6:15 PM   Specimen: Urine, Random  Result Value Ref Range Status   Specimen Description URINE, RANDOM  Final   Special Requests   Final    NONE Performed at [facility], [address], [place], [place] [address]    Culture (A)  Final    80,000 COLONIES/mL CITROBACTER FREUNDII 80,000 COLONIES/mL PSEUDOMONAS AERUGINOSA    Report Status 02/17/2019 FINAL  Final   Organism ID, Bacteria CITROBACTER FREUNDII (A)  Final   Organism ID, Bacteria PSEUDOMONAS AERUGINOSA (A)  Final      Susceptibility   Citrobacter freundii - MIC*    CEFAZOLIN >=64 RESISTANT Resistant     CEFTRIAXONE >=64 RESISTANT Resistant     CIPROFLOXACIN <=0.25 SENSITIVE Sensitive     GENTAMICIN <=1 SENSITIVE Sensitive     IMIPENEM <=0.25 SENSITIVE Sensitive     NITROFURANTOIN <=16 SENSITIVE Sensitive     TRIMETH/SULFA <=20 SENSITIVE Sensitive     PIP/TAZO 64 INTERMEDIATE Intermediate     * 80,000 COLONIES/mL CITROBACTER FREUNDII   Pseudomonas aeruginosa - MIC*    CEFTAZIDIME >=64 RESISTANT Resistant     CIPROFLOXACIN 1 SENSITIVE Sensitive     GENTAMICIN <=1 SENSITIVE Sensitive     IMIPENEM 2 SENSITIVE Sensitive     PIP/TAZO 32 SENSITIVE Sensitive     CEFEPIME 8 SENSITIVE Sensitive     * 80,000 COLONIES/mL PSEUDOMONAS AERUGINOSA      Radiology Studies: No results found.    Scheduled Meds: . allopurinol  100 mg Oral Daily  . aspirin EC  81 mg Oral QHS  . carvedilol  12.5 mg Oral BID WC  . ciprofloxacin  250 mg Oral BID  . colchicine  0.3 mg Oral Daily  . diclofenac sodium  2 g Topical QID  .  famotidine  20 mg Oral BID  . ferrous sulfate  325 mg Oral Q breakfast  . fluticasone furoate-vilanterol  1 puff Inhalation Daily  . furosemide  120 mg Oral BID  . gabapentin  100 mg Oral Once  . gabapentin  200 mg Oral TID  . heparin  5,000 Units Subcutaneous Q8H  . hydrocortisone cream   Topical BID  .  lactobacillus acidophilus  2 tablet Oral TID  . montelukast  10 mg Oral QHS  . Oxcarbazepine  300 mg Oral BID  . silver sulfADIAZINE  1 application Topical Daily  . simvastatin  40 mg Oral QHS  . spironolactone  25 mg Oral Daily   Continuous Infusions:   LOS: 18 days      Time spent: 20 minutes   Dessa Phi, DO [facility] 02/20/2019, 8:11 AM   Available via Epic secure chat 7am-7pm After these hours, please refer to coverage provider listed on [email or web address]

## 2019-02-20 NOTE — Plan of Care (Signed)

## 2019-02-21 MED ORDER — POLYETHYLENE GLYCOL 3350 17 G PO PACK
17.0000 g | PACK | Freq: Every day | ORAL | Status: DC
  Administered 2019-02-21 – 2019-03-01 (×5): 17 g via ORAL
  Filled 2019-02-21 (×8): qty 1

## 2019-02-21 MED ORDER — SENNOSIDES-DOCUSATE SODIUM 8.6-50 MG PO TABS
1.0000 | ORAL_TABLET | Freq: Every evening | ORAL | Status: DC | PRN
  Administered 2019-02-24: 1 via ORAL
  Filled 2019-02-21: qty 1

## 2019-02-21 NOTE — Progress Notes (Signed)
PROGRESS NOTE    April [name]  UKG:[id number] DOB: 01-16-1951 DOA: 02/02/2019 PCP: [name], MD     Brief Narrative:  April [name] is a 69 year old female with history of hypertension, hyperlipidemia, diabetes mellitus type 2, neuropathy, chronic kidney disease stage III, CAD, asthma, morbid obesity, recent Covid infection in November requiring hospitalization and treatment with steroids/remdesivir, chronic right shin wound, chronic ambulatory dysfunction presented with worsening weakness along with new pain and rash around the right shin ulcer area along with painful blister on the bottom of the left foot. She has now completed antibiotics for cellulitis. Awaiting SNF placement.   New events last 24 hours / Subjective: Needed heating pad for her knee pain. Asking to have mayo daily (apparently kitchen does not allow due to mustard allergy).   Assessment & Plan:   Active Problems:   Neuropathy   Cellulitis   Non-pressure chronic ulcer of left heel and midfoot limited to breakdown of skin (HCC)   Idiopathic chronic gout of multiple sites without tophus   SOB (shortness of breath)   Acute idiopathic gout of right foot   Morbid obesity (HCC)   Cellulitis surrounding right chronic wound of the shin, POA, and left plantar foot blister with possible cellulitis, POA  -Patient admitted with cellulitis and plantar foot blister and placed on empiric IV vancomycin and IV Rocephin.  Plain films of bilateral feet as well as right tib-fib showed no acute lesions except some soft tissue swelling of the left foot.  Orthopedics was consulted patient seen in consultation by orthopedics who recommended nonweightbearing for both extremities and to continue antibiotics and transition to oral antibiotics on discharge.  MRSA nasal PCR was negative.  Patient completed 10-day antibiotics.    Acute gout flare -Patient with some exquisite tenderness to palpation on the right heel.  Patient with a  history of uncontrolled gout per orthopedics.  Uric acid level was ordered which was elevated at 11.1.  Status post Solu-Medrol 60 mg IV x1 on 02/08/2019.  Status post 5-day course of oral prednisone 40 mg daily. Continue allopurinol   Acute on chronic ambulatory dysfunction/generalized deconditioning -PT recommended SNF.  Social work consulted.  Acute kidney injury on chronic kidney disease stage IIIa -Baseline creatinine fluctuates between 1.2-1.5. Patient's diuretics resumed on 02/05/2019 at a lower dose of 80 mg twice daily and dose increased back to home regimen of 120 mg twice daily.  Spironolactone resumed -Creatinine remains stable  Chronic neuropathy -Patient still with complaints of heel pain which likely secondary to neuropathy.  Continue gabapentin which is renally dosed.  Increase gabapentin to 300 mg 3 times daily. Could go up as high as 600 mg 3 times daily.  Patient states took Lyrica before in the past however hallucinated and as such discontinued.  Hypertension -Continue Coreg, spironolactone, Lasix  Hyperlipidemia -Continue Zocor  Asthma/COPD -Patient with some complaints of shortness of breath which has improved with diuresis. Chest x-ray with bibasilar ill-defined airspace opacity likely atypical organism pneumonia.  Lung sounds were clear.  Patient recently treated for COVID-19 in November 2020 and likely chest x-ray findings from prior COVID-19 infection.  Currently on room air. Breathing treatment prn.   Morbid obesity Estimated body mass index is 60.56 kg/m as calculated from the following:   Height as of this encounter: 5\' 2"  (1.575 m).   Weight as of this encounter: 150.2 kg.   UTI, not POA  -Urinalysis unremarkable -Urine culture showing 80,000 Pseudomonas and Citrobacter -Cipro x 5 days  DVT prophylaxis: Subcutaneous heparin Code Status: Full code Family Communication: None at bedside Disposition Plan: SNF placement pending, remains difficult to  place   Consultants:   Orthopedic surgery, Dr. [name]  Antimicrobials:  Anti-infectives (From admission, onward)   Start     Dose/Rate Route Frequency Ordered Stop   02/17/19 1215  ciprofloxacin (CIPRO) tablet 250 mg     250 mg Oral 2 times daily 02/17/19 1202 02/22/19 0759   02/07/19 1400  vancomycin (VANCOREADY) IVPB 1250 mg/250 mL  Status:  Discontinued     1,250 mg 166.7 mL/hr over 90 Minutes Intravenous Every 24 hours 02/07/19 1313 02/08/19 1456   02/05/19 1400  vancomycin (VANCOREADY) IVPB 2000 mg/400 mL  Status:  Discontinued     2,000 mg 200 mL/hr over 120 Minutes Intravenous Every 48 hours 02/03/19 1153 02/04/19 1323   02/05/19 1400  vancomycin (VANCOREADY) IVPB 1500 mg/300 mL  Status:  Discontinued     1,500 mg 150 mL/hr over 120 Minutes Intravenous Every 24 hours 02/05/19 1020 02/07/19 1313   02/04/19 1400  vancomycin (VANCOREADY) IVPB 1250 mg/250 mL  Status:  Discontinued     1,250 mg 166.7 mL/hr over 90 Minutes Intravenous Every 24 hours 02/04/19 1323 02/05/19 1020   02/03/19 1245  vancomycin (VANCOCIN) 2,500 mg in sodium chloride 0.9 % 500 mL IVPB     2,500 mg 250 mL/hr over 120 Minutes Intravenous  Once 02/03/19 1143 02/03/19 1434   02/02/19 2000  cefTRIAXone (ROCEPHIN) 2 g in sodium chloride 0.9 % 100 mL IVPB  Status:  Discontinued     2 g 200 mL/hr over 30 Minutes Intravenous Every 24 hours 02/02/19 1908 02/12/19 0814   02/02/19 1830  clindamycin (CLEOCIN) IVPB 600 mg     600 mg 100 mL/hr over 30 Minutes Intravenous  Once 02/02/19 1822 02/02/19 1921       Objective: Vitals:   02/20/19 1741 02/20/19 1951 02/21/19 0047 02/21/19 0330  BP: 100/64 (!) 96/53  135/69  Pulse: 80 79  77  Resp:  17  14  Temp:  98.2 F (36.8 C)  98.6 F (37 C)  TempSrc:  Oral  Oral  SpO2: 90% 94% 95% 90%  Weight:      Height:        Intake/Output Summary (Last 24 hours) at 02/21/2019 0757 Last data filed at 02/21/2019 0500 Gross per 24 hour  Intake 720 ml  Output 2750 ml   Net -2030 ml   Filed Weights   02/11/19 0500 02/14/19 0600 02/18/19 0500  Weight: (!) 155.7 kg (!) 154.1 kg (!) 150.2 kg     Examination: General exam: Appears calm and comfortable  Respiratory system: Clear to auscultation. Respiratory effort normal. Cardiovascular system: S1 & S2 heard, RRR. No pedal edema. Gastrointestinal system: Abdomen is nondistended, soft and nontender. Normal bowel sounds heard. Central nervous system: Alert and oriented. Non focal exam. Speech clear  Extremities: Symmetric in appearance bilaterally  Psychiatry: Judgement and insight appear stable. Mood & affect appropriate.     Data Reviewed: I have personally reviewed following labs and imaging studies  CBC: Recent Labs  Lab 02/15/19 0433  HGB 11.7*  HCT 67.8   Basic Metabolic Panel: Recent Labs  Lab 02/15/19 0433 02/16/19 0405 02/18/19 0244  NA 135 137 136  K 4.2 3.9 4.0  CL 92* 92* 92*  CO2 33* 34* 35*  GLUCOSE 151* 138* 127*  BUN 34* 30* 27*  CREATININE 1.47* 1.32* 1.44*  CALCIUM 8.5* 8.8* 8.8*  MG  2.2  --   --    GFR: Estimated Creatinine Clearance: 53.2 mL/min (A) (by C-G formula based on SCr of 1.44 mg/dL (H)). Liver Function Tests: No results for input(s): AST, ALT, ALKPHOS, BILITOT, PROT, ALBUMIN in the last 168 hours. No results for input(s): LIPASE, AMYLASE in the last 168 hours. No results for input(s): AMMONIA in the last 168 hours. Coagulation Profile: No results for input(s): INR, PROTIME in the last 168 hours. Cardiac Enzymes: No results for input(s): CKTOTAL, CKMB, CKMBINDEX, TROPONINI in the last 168 hours. BNP (last 3 results) No results for input(s): PROBNP in the last 8760 hours. HbA1C: No results for input(s): HGBA1C in the last 72 hours. CBG: No results for input(s): GLUCAP in the last 168 hours. Lipid Profile: No results for input(s): CHOL, HDL, LDLCALC, TRIG, CHOLHDL, LDLDIRECT in the last 72 hours. Thyroid Function Tests: No results for input(s): TSH,  T4TOTAL, FREET4, T3FREE, THYROIDAB in the last 72 hours. Anemia Panel: No results for input(s): VITAMINB12, FOLATE, FERRITIN, TIBC, IRON, RETICCTPCT in the last 72 hours. Sepsis Labs: No results for input(s): PROCALCITON, LATICACIDVEN in the last 168 hours.  Recent Results (from the past 240 hour(s))  Culture, Urine     Status: Abnormal   Collection Time: 02/15/19  6:15 PM   Specimen: Urine, Random  Result Value Ref Range Status   Specimen Description URINE, RANDOM  Final   Special Requests   Final    NONE Performed at [facility], [address] [place], [place] [address]    Culture (A)  Final    80,000 COLONIES/mL CITROBACTER FREUNDII 80,000 COLONIES/mL PSEUDOMONAS AERUGINOSA    Report Status 02/17/2019 FINAL  Final   Organism ID, Bacteria CITROBACTER FREUNDII (A)  Final   Organism ID, Bacteria PSEUDOMONAS AERUGINOSA (A)  Final      Susceptibility   Citrobacter freundii - MIC*    CEFAZOLIN >=64 RESISTANT Resistant     CEFTRIAXONE >=64 RESISTANT Resistant     CIPROFLOXACIN <=0.25 SENSITIVE Sensitive     GENTAMICIN <=1 SENSITIVE Sensitive     IMIPENEM <=0.25 SENSITIVE Sensitive     NITROFURANTOIN <=16 SENSITIVE Sensitive     TRIMETH/SULFA <=20 SENSITIVE Sensitive     PIP/TAZO 64 INTERMEDIATE Intermediate     * 80,000 COLONIES/mL CITROBACTER FREUNDII   Pseudomonas aeruginosa - MIC*    CEFTAZIDIME >=64 RESISTANT Resistant     CIPROFLOXACIN 1 SENSITIVE Sensitive     GENTAMICIN <=1 SENSITIVE Sensitive     IMIPENEM 2 SENSITIVE Sensitive     PIP/TAZO 32 SENSITIVE Sensitive     CEFEPIME 8 SENSITIVE Sensitive     * 80,000 COLONIES/mL PSEUDOMONAS AERUGINOSA      Radiology Studies: No results found.    Scheduled Meds: . allopurinol  100 mg Oral Daily  . aspirin EC  81 mg Oral QHS  . carvedilol  12.5 mg Oral BID WC  . ciprofloxacin  250 mg Oral BID  . colchicine  0.3 mg Oral Daily  . diclofenac sodium  2 g Topical QID  . famotidine  20 mg Oral BID  . ferrous  sulfate  325 mg Oral Q breakfast  . fluticasone furoate-vilanterol  1 puff Inhalation Daily  . furosemide  120 mg Oral BID  . gabapentin  300 mg Oral TID  . heparin  5,000 Units Subcutaneous Q8H  . hydrocortisone cream   Topical BID  . lactobacillus acidophilus  2 tablet Oral TID  . montelukast  10 mg Oral QHS  . Oxcarbazepine  300 mg Oral BID  . silver sulfADIAZINE  1 application Topical Daily  . simvastatin  40 mg Oral QHS  . spironolactone  25 mg Oral Daily   Continuous Infusions:   LOS: 19 days      Time spent: 20 minutes   Dessa Phi, DO [facility] 02/21/2019, 7:57 AM   Available via Epic secure chat 7am-7pm After these hours, please refer to coverage provider listed on [email or web address]

## 2019-02-21 NOTE — TOC Progression Note (Signed)
Transition of Care [facility]) - Progression Note    Patient Details  Name: April [name] MRN: [id number] Date of Birth: 25-Jan-1951  Transition of Care [facility]) CM/SW [place], LCSW Phone Number: 02/21/2019, 3:06 PM  Clinical Narrative:    [facility] offered patient a bed. However, CSW followed up with admissions and they stated that as of this morning they are accepting only COVID + patients.   Patient originally COVID +, but now she is testing negative.   CSW will continue to search for SNF placement.    Expected Discharge Plan: [place] Barriers to Discharge: Continued Medical Work up  Expected Discharge Plan and Services Expected Discharge Plan: [place] In-house Referral: Clinical Social Work   Post Acute Care Choice: [place] Living arrangements for the past 2 months: Single Family Home                                       Social Determinants of Health (SDOH) Interventions    Readmission Risk Interventions No flowsheet data found.

## 2019-02-21 NOTE — Plan of Care (Signed)

## 2019-02-22 LAB — BASIC METABOLIC PANEL
Anion gap: 9 (ref 5–15)
BUN: 38 mg/dL — ABNORMAL HIGH (ref 8–23)
CO2: 35 mmol/L — ABNORMAL HIGH (ref 22–32)
Calcium: 8.8 mg/dL — ABNORMAL LOW (ref 8.9–10.3)
Chloride: 89 mmol/L — ABNORMAL LOW (ref 98–111)
Creatinine, Ser: 1.87 mg/dL — ABNORMAL HIGH (ref 0.44–1.00)
GFR calc Af Amer: 31 mL/min — ABNORMAL LOW (ref >60)
GFR calc non Af Amer: 27 mL/min — ABNORMAL LOW (ref >60)
Glucose, Bld: 131 mg/dL — ABNORMAL HIGH (ref 70–99)
Potassium: 4.6 mmol/L (ref 3.5–5.1)
Sodium: 133 mmol/L — ABNORMAL LOW (ref 135–145)

## 2019-02-22 MED ORDER — BISACODYL 10 MG RE SUPP
10.0000 mg | Freq: Every day | RECTAL | Status: AC
  Administered 2019-02-22 – 2019-02-23 (×2): 10 mg via RECTAL
  Filled 2019-02-22 (×2): qty 1

## 2019-02-22 MED ORDER — FERROUS SULFATE 325 (65 FE) MG PO TABS
325.0000 mg | ORAL_TABLET | ORAL | Status: DC
  Administered 2019-02-24 – 2019-03-01 (×3): 325 mg via ORAL
  Filled 2019-02-22 (×4): qty 1

## 2019-02-22 MED ORDER — GABAPENTIN 100 MG PO CAPS
200.0000 mg | ORAL_CAPSULE | Freq: Three times a day (TID) | ORAL | Status: DC
  Administered 2019-02-22 – 2019-02-23 (×3): 200 mg via ORAL
  Filled 2019-02-22 (×3): qty 2

## 2019-02-22 MED ORDER — FUROSEMIDE 40 MG PO TABS
80.0000 mg | ORAL_TABLET | Freq: Two times a day (BID) | ORAL | Status: DC
  Administered 2019-02-23 – 2019-03-01 (×12): 80 mg via ORAL
  Filled 2019-02-22 (×13): qty 2

## 2019-02-22 NOTE — Progress Notes (Signed)
Bilateral posterior mepilex dressings changed. Wound site pink, yellow, scant drainage. Right shin dressing changed, no drainage.

## 2019-02-22 NOTE — Plan of Care (Signed)
  Problem: Education: Goal: Knowledge of General Education information will improve Description: Including pain rating scale, medication(s)/side effects and non-pharmacologic comfort measures Outcome: Progressing   Problem: Clinical Measurements: Goal: Respiratory complications will improve Outcome: Progressing Note: On room air   Problem: Nutrition: Goal: Adequate nutrition will be maintained Outcome: Progressing   Problem: Coping: Goal: Level of anxiety will decrease Outcome: Progressing   Problem: Elimination: Goal: Will not experience complications related to bowel motility Outcome: Progressing Note: BM this shift   Problem: Pain Managment: Goal: General experience of comfort will improve Outcome: Progressing Note: No complaints of pain unless touching feet   Problem: Safety: Goal: Ability to remain free from injury will improve Outcome: Progressing

## 2019-02-22 NOTE — Plan of Care (Signed)

## 2019-02-22 NOTE — Progress Notes (Signed)
Physical Therapy Treatment Patient Details Name: April [name] MRN: [id number] DOB: 12/20/1950 Today's Date: 02/22/2019    History of Present Illness Pt is a 69 y.o. female with medical history significant of hypertension, hyperlipidemia, Dm2, neuropathy, CKD stage 3, CAD , Asthma, morbid obesity, recent Covid infection in November, presented with bilateral lower extremity cellulitis and generalized weakness.    PT Comments    Pt remains very limited with functional mobility secondary to generalized weakness, fatigue, pain in bilateral LEs and inability to bear weight through either LE. Pt does not currently have the strength to perform a scoot transfer and refuses use of the Maximove to transfer her OOB secondary to pain. PT reviewed HEP for pt to perform on her own throughout the day. PT also reiterated the importance of positional changes in bed for the purposes of skin integrity. Pt agreeable to plan. Pt would continue to benefit from skilled physical therapy services at this time while admitted and after d/c to address the below listed limitations in order to improve overall safety and independence with functional mobility.    Follow Up Recommendations  SNF     Equipment Recommendations  Other (comment)(TBD at next venue of care)    Recommendations for Other Services       Precautions / Restrictions Precautions Precautions: Fall Restrictions Weight Bearing Restrictions: Yes RLE Weight Bearing: Non weight bearing LLE Weight Bearing: Non weight bearing    Mobility  Bed Mobility               General bed mobility comments: focus of session was on bilateral LE strengthening therex in bed for pt education on HEP to perform throughout the week on her own  Transfers                 General transfer comment: pt continues to refuse use of Maximove to transfer OOB  Ambulation/Gait                 Stairs             Wheelchair Mobility     Modified Rankin (Stroke Patients Only)       Balance                                            Cognition Arousal/Alertness: Awake/alert Behavior During Therapy: WFL for tasks assessed/performed Overall Cognitive Status: Within Functional Limits for tasks assessed                                        Exercises General Exercises - Lower Extremity Ankle Circles/Pumps: AROM;Both;20 reps;Supine Quad Sets: AROM;Strengthening;Both;10 reps;Supine Gluteal Sets: AROM;Strengthening;Both;15 reps;Supine Heel Slides: AROM;Strengthening;Both;10 reps;Supine Hip ABduction/ADduction: AAROM;Both;10 reps;Supine    General Comments        Pertinent Vitals/Pain Pain Assessment: Faces Faces Pain Scale: Hurts even more Pain Location: bilateral LEs, groin Pain Descriptors / Indicators: Grimacing;Guarding Pain Intervention(s): Monitored during session;Repositioned    Home Living                      Prior Function            PT Goals (current goals can now be found in the care plan section) Acute Rehab PT Goals PT Goal Formulation: With patient Time For  Goal Achievement: 03/08/19 Potential to Achieve Goals: Fair Progress towards PT goals: Not progressing toward goals - comment(?self-limiting)    Frequency    Min 1X/week      PT Plan Current plan remains appropriate    Co-evaluation              AM-PAC PT "6 Clicks" Mobility   Outcome Measure  Help needed turning from your back to your side while in a flat bed without using bedrails?: A Lot Help needed moving from lying on your back to sitting on the side of a flat bed without using bedrails?: A Lot Help needed moving to and from a bed to a chair (including a wheelchair)?: Total Help needed standing up from a chair using your arms (e.g., wheelchair or bedside chair)?: Total Help needed to walk in hospital room?: Total Help needed climbing 3-5 steps with a railing? :  Total 6 Click Score: 8    End of Session   Activity Tolerance: Patient limited by pain;Patient limited by fatigue Patient left: in bed;with call bell/phone within reach;with bed alarm set Nurse Communication: Mobility status PT Visit Diagnosis: Muscle weakness (generalized) (M62.81);Pain Pain - Right/Left: Right Pain - part of body: Leg;Knee     Time: 2637-8588 PT Time Calculation (min) (ACUTE ONLY): 17 min  Charges:  $Therapeutic Exercise: 8-22 mins                     Anastasio Champion, DPT  Acute Rehabilitation Services Pager [phone] Office [place] 02/22/2019, 5:11 PM

## 2019-02-22 NOTE — Progress Notes (Addendum)
PROGRESS NOTE  Chanele Douglas BDZ:[id number] DOB: [birth year] DOA: 02/02/2019 PCP: [name], MD  Brief Narrative:  Hiilei Gerst is a 69 year old female with history of hypertension, hyperlipidemia, diabetes mellitus type 2, neuropathy, chronic kidney disease stage III, CAD, asthma, morbid obesity, recent Covid infection in November requiring hospitalization and treatment with steroids/remdesivir, chronic right shin wound, chronic ambulatory dysfunction presented with worsening weakness along with new pain and rash around the right shin ulcer area along with painful blister on the bottom of the left foot. She has now completed antibiotics for cellulitis. Awaiting SNF placement.     HPI/Recap of past 24 hours: C/o shaky on high dose neurontin, want it decreased C/o being constipated  C/o diet Awaiting for snf placement  Assessment/Plan: Active Problems:   Neuropathy   Cellulitis   Non-pressure chronic ulcer of left heel and midfoot limited to breakdown of skin (HCC)   Idiopathic chronic gout of multiple sites without tophus   SOB (shortness of breath)   Acute idiopathic gout of right foot   Morbid obesity (HCC)   Cellulitis surrounding right chronic wound of the shin, POA, and leftplantar foot blister with possible cellulitis, POA  -Patient admitted with cellulitis and plantar foot blister and placed on empiric IV vancomycin and IV Rocephin. -Plain films of bilateral feet as well as right tib-fib showed no acute lesions except some soft tissue swelling of the left foot.  -due to "Ulceration bilateral heels and bilateral posterior thighs status post skin graft to her right leg" ortho consulted -Orthopedics Dr [name] recommended nonweightbearing for both extremities and to continue antibiotics and transition to oral antibiotics on discharge. Patient completed 10-day antibiotics.  -MRSA nasal PCR was negative.  -awaiting for snf placement  Acute gout flare -Patient  with some exquisite tenderness to palpation on the right heel. Patient with a history of uncontrolled gout per orthopedics. Uric acid level was ordered which was elevated at 11.1. Status post Solu-Medrol 60 mg IV x1 on 02/08/2019. Status post 5-day course of oral prednisone 40 mg daily. Continue allopurinol  Acute on chronic ambulatory dysfunction/generalizeddeconditioning -PT recommended SNF. Social work consulted.  Acute kidney injury on chronic kidney disease stage IIIa -Baseline creatinine fluctuates between 1.2-1.5. Patient's diuretics resumed on 02/05/2019 at a lower dose of 80 mg twice daily and dose increased back to home regimen of 120 mg twice daily.  -Creatinine appear to trend up, decrease Lasix dose to 80 mg twice a day, continue spironolactone, continue fluid restriction -BMP in the morning  Chronic neuropathy -Patient still with complaints of heel pain which likely secondary to neuropathy. Continue gabapentin which is renally dosed. -Report  tremor on increased dose of gabapentin , wants  dose decreased to 200 mg 3 times a day which is ordered  -Patient states took Lyrica before in the past however hallucinated and as such discontinued.  Hypertension -Continue Coreg, spironolactone, Lasix  Hyperlipidemia -Continue Zocor  Asthma/COPD -Patient with some complaints of shortness of breath which has improved with diuresis. Chest x-ray with bibasilar ill-defined airspace opacity likely atypical organism pneumonia. Lung sounds were clear. Patient recently treated for COVID-19 in November 2020 and likely chest x-ray findings from prior COVID-19 infection.  Currently on room air. Breathing treatment prn.  -Upper airway sound intermittently during exam, lung clear  Morbid obesity Estimated body mass index is 60.56 kg/m as calculated from the following:   Height as of this encounter: 5\' 2"  (1.575 m).   Weight as of this encounter: 150.2 kg.  UTI, not POA    -Urinalysis unremarkable -Urine culture showing 80,000 Pseudomonas and Citrobacter -Cipro x 5 days    Constipation: Decrease iron supplement from daily to Monday Wednesday Friday Continue MiraLAX daily, add on suppository  DVT prophylaxis: Subcutaneous heparin Code Status: Full code Family Communication: None at bedside Disposition Plan: SNF placement pending, remains difficult to place   Consultants:   Orthopedic surgery, Dr. [name]   Procedures:  none  Antibiotics:  As above   Objective: BP 138/72   Pulse 84   Temp 99 F (37.2 C) (Oral)   Resp 18   Ht 5\' 2"  (1.575 m)   Wt (!) 151.9 kg   SpO2 95%   BMI 61.24 kg/m   Intake/Output Summary (Last 24 hours) at 02/22/2019 1357 Last data filed at 02/22/2019 1322 Gross per 24 hour  Intake 360 ml  Output 2200 ml  Net -1840 ml   Filed Weights   02/14/19 0600 02/18/19 0500 02/22/19 0504  Weight: (!) 154.1 kg (!) 150.2 kg (!) 151.9 kg    Exam: Patient is examined daily including today on 02/22/2019, exams remain the same as of yesterday except that has changed    General:  NAD  Cardiovascular: RRR  Respiratory: CTABL  Abdomen: Soft/ND/NT, positive BS  Musculoskeletal: No Edema, right anterior shin wound appear healing, dressing intact, no edema, does has chronic venous stasis skin changes in lower legs  Neuro: alert, oriented   Data Reviewed: Basic Metabolic Panel: Recent Labs  Lab 02/16/19 0405 02/18/19 0244 02/22/19 0222  NA 137 136 133*  K 3.9 4.0 4.6  CL 92* 92* 89*  CO2 34* 35* 35*  GLUCOSE 138* 127* 131*  BUN 30* 27* 38*  CREATININE 1.32* 1.44* 1.87*  CALCIUM 8.8* 8.8* 8.8*   Liver Function Tests: No results for input(s): AST, ALT, ALKPHOS, BILITOT, PROT, ALBUMIN in the last 168 hours. No results for input(s): LIPASE, AMYLASE in the last 168 hours. No results for input(s): AMMONIA in the last 168 hours. CBC: No results for input(s): WBC, NEUTROABS, HGB, HCT, MCV, PLT in the last 168  hours. Cardiac Enzymes:   No results for input(s): CKTOTAL, CKMB, CKMBINDEX, TROPONINI in the last 168 hours. BNP (last 3 results) Recent Labs    08/15/18 1019 11/09/18 1140 12/06/18 1955  BNP 23.6 84.9 32.5    ProBNP (last 3 results) No results for input(s): PROBNP in the last 8760 hours.  CBG: No results for input(s): GLUCAP in the last 168 hours.  Recent Results (from the past 240 hour(s))  Culture, Urine     Status: Abnormal   Collection Time: 02/15/19  6:15 PM   Specimen: Urine, Random  Result Value Ref Range Status   Specimen Description URINE, RANDOM  Final   Special Requests   Final    NONE Performed at [facility], [address] [place], [place] [address]    Culture (A)  Final    80,000 COLONIES/mL CITROBACTER FREUNDII 80,000 COLONIES/mL PSEUDOMONAS AERUGINOSA    Report Status 02/17/2019 FINAL  Final   Organism ID, Bacteria CITROBACTER FREUNDII (A)  Final   Organism ID, Bacteria PSEUDOMONAS AERUGINOSA (A)  Final      Susceptibility   Citrobacter freundii - MIC*    CEFAZOLIN >=64 RESISTANT Resistant     CEFTRIAXONE >=64 RESISTANT Resistant     CIPROFLOXACIN <=0.25 SENSITIVE Sensitive     GENTAMICIN <=1 SENSITIVE Sensitive     IMIPENEM <=0.25 SENSITIVE Sensitive     NITROFURANTOIN <=16 SENSITIVE  Sensitive     TRIMETH/SULFA <=20 SENSITIVE Sensitive     PIP/TAZO 64 INTERMEDIATE Intermediate     * 80,000 COLONIES/mL CITROBACTER FREUNDII   Pseudomonas aeruginosa - MIC*    CEFTAZIDIME >=64 RESISTANT Resistant     CIPROFLOXACIN 1 SENSITIVE Sensitive     GENTAMICIN <=1 SENSITIVE Sensitive     IMIPENEM 2 SENSITIVE Sensitive     PIP/TAZO 32 SENSITIVE Sensitive     CEFEPIME 8 SENSITIVE Sensitive     * 80,000 COLONIES/mL PSEUDOMONAS AERUGINOSA     Studies: No results found.  Scheduled Meds: . allopurinol  100 mg Oral Daily  . aspirin EC  81 mg Oral QHS  . bisacodyl  10 mg Rectal Daily  . carvedilol  12.5 mg Oral BID WC  . colchicine  0.3 mg Oral  Daily  . diclofenac sodium  2 g Topical QID  . famotidine  20 mg Oral BID  . [START ON 02/24/2019] ferrous sulfate  325 mg Oral Q M,W,F  . fluticasone furoate-vilanterol  1 puff Inhalation Daily  . furosemide  120 mg Oral BID  . gabapentin  200 mg Oral TID  . heparin  5,000 Units Subcutaneous Q8H  . hydrocortisone cream   Topical BID  . lactobacillus acidophilus  2 tablet Oral TID  . montelukast  10 mg Oral QHS  . Oxcarbazepine  300 mg Oral BID  . polyethylene glycol  17 g Oral Daily  . silver sulfADIAZINE  1 application Topical Daily  . simvastatin  40 mg Oral QHS  . spironolactone  25 mg Oral Daily    Continuous Infusions:   Time spent: 46mins I have personally reviewed and interpreted on  02/22/2019 daily labs, imagings as discussed above under date review session and assessment and plans.  I reviewed all nursing notes, pharmacy notes, consultant notes,  vitals, pertinent old records  I have discussed plan of care as described above with RN , patient  on 02/22/2019   Florencia Reasons MD, PhD, FACP  [facility]  Available via Epic secure chat 7am-7pm for nonurgent issues Please page for urgent issues, pager number available through [email or web address] .   02/22/2019, 1:57 PM  LOS: 20 days

## 2019-02-23 ENCOUNTER — Inpatient Hospital Stay (HOSPITAL_COMMUNITY): Payer: Medicare Other

## 2019-02-23 DIAGNOSIS — J9601 Acute respiratory failure with hypoxia: Secondary | ICD-10-CM

## 2019-02-23 LAB — CBC WITH DIFFERENTIAL/PLATELET
Abs Immature Granulocytes: 0.04 10*3/uL (ref 0.00–0.07)
Basophils Absolute: 0.1 10*3/uL (ref 0.0–0.1)
Basophils Relative: 1 %
Eosinophils Absolute: 0.2 10*3/uL (ref 0.0–0.5)
Eosinophils Relative: 3 %
HCT: 38.7 % (ref 36.0–46.0)
Hemoglobin: 12.1 g/dL (ref 12.0–15.0)
Immature Granulocytes: 1 %
Lymphocytes Relative: 31 %
Lymphs Abs: 1.9 10*3/uL (ref 0.7–4.0)
MCH: 29.9 pg (ref 26.0–34.0)
MCHC: 31.3 g/dL (ref 30.0–36.0)
MCV: 95.6 fL (ref 80.0–100.0)
Monocytes Absolute: 0.6 10*3/uL (ref 0.1–1.0)
Monocytes Relative: 10 %
Neutro Abs: 3.2 10*3/uL (ref 1.7–7.7)
Neutrophils Relative %: 54 %
Platelets: 196 10*3/uL (ref 150–400)
RBC: 4.05 MIL/uL (ref 3.87–5.11)
RDW: 14.9 % (ref 11.5–15.5)
WBC: 5.9 10*3/uL (ref 4.0–10.5)
nRBC: 0 % (ref 0.0–0.2)

## 2019-02-23 LAB — COMPREHENSIVE METABOLIC PANEL
ALT: 15 U/L (ref 0–44)
AST: 21 U/L (ref 15–41)
Albumin: 3.2 g/dL — ABNORMAL LOW (ref 3.5–5.0)
Alkaline Phosphatase: 76 U/L (ref 38–126)
Anion gap: 12 (ref 5–15)
BUN: 42 mg/dL — ABNORMAL HIGH (ref 8–23)
CO2: 34 mmol/L — ABNORMAL HIGH (ref 22–32)
Calcium: 9.4 mg/dL (ref 8.9–10.3)
Chloride: 90 mmol/L — ABNORMAL LOW (ref 98–111)
Creatinine, Ser: 1.73 mg/dL — ABNORMAL HIGH (ref 0.44–1.00)
GFR calc Af Amer: 35 mL/min — ABNORMAL LOW (ref >60)
GFR calc non Af Amer: 30 mL/min — ABNORMAL LOW (ref >60)
Glucose, Bld: 147 mg/dL — ABNORMAL HIGH (ref 70–99)
Potassium: 4.2 mmol/L (ref 3.5–5.1)
Sodium: 136 mmol/L (ref 135–145)
Total Bilirubin: 0.5 mg/dL (ref 0.3–1.2)
Total Protein: 6.1 g/dL — ABNORMAL LOW (ref 6.5–8.1)

## 2019-02-23 MED ORDER — GABAPENTIN 100 MG PO CAPS
100.0000 mg | ORAL_CAPSULE | Freq: Three times a day (TID) | ORAL | Status: DC
  Administered 2019-02-23 – 2019-03-01 (×19): 100 mg via ORAL
  Filled 2019-02-23 (×19): qty 1

## 2019-02-23 NOTE — Progress Notes (Addendum)
PROGRESS NOTE  April [name] QHU:[id number] DOB: [birth year] DOA: 02/02/2019 PCP: [name], MD  Brief Narrative:  April [name] is a 69 year old female with history of hypertension, hyperlipidemia, diabetes mellitus type 2, neuropathy, chronic kidney disease stage III, CAD, asthma, morbid obesity, recent Covid infection in November requiring hospitalization and treatment with steroids/remdesivir, chronic right shin wound, chronic ambulatory dysfunction presented with worsening weakness along with new pain and rash around the right shin ulcer area along with painful blister on the bottom of the left foot. She has now completed antibiotics for cellulitis. Awaiting SNF placement.     HPI/Recap of past 24 hours:   RN report patient is hypoxic while asleep  Patient wants Neurontin dose decreased back to her home dose due to being shaky on higher dose  Awaiting for snf placement  Assessment/Plan: Active Problems:   Neuropathy   Cellulitis   Non-pressure chronic ulcer of left heel and midfoot limited to breakdown of skin (HCC)   Idiopathic chronic gout of multiple sites without tophus   SOB (shortness of breath)   Acute idiopathic gout of right foot   Morbid obesity (HCC)   Cellulitis surrounding right chronic wound of the shin, POA, and leftplantar foot blister with possible cellulitis, POA  -Patient admitted with cellulitis and plantar foot blister and placed on empiric IV vancomycin and IV Rocephin. -Plain films of bilateral feet as well as right tib-fib showed no acute lesions except some soft tissue swelling of the left foot.  -due to "Ulceration bilateral heels and bilateral posterior thighs status post skin graft to her right leg" ortho consulted -Orthopedics Dr [name] recommended nonweightbearing for both extremities and to continue antibiotics and transition to oral antibiotics on discharge. Patient completed 10-day antibiotics.  -MRSA nasal PCR was negative.    -awaiting for snf placement  Acute gout flare -Per chart review patient's uric acid level ranged from 10.5-14.8 since July 2020, most recent uric acid level 11.1 from January 2021  -patient with some exquisite tenderness to palpation on the right heel. Patient with a history of uncontrolled gout per orthopedic -Status post Solu-Medrol 60 mg IV x1 on 02/08/2019. Status post 5-day course of oral prednisone 40 mg daily. - Continue allopurinoland colchicine  Acute on chronic ambulatory dysfunction/generalizeddeconditioning -PT recommended SNF. Social work consulted.  Acute kidney injury on chronic kidney disease stage IIIa -Baseline creatinine fluctuates between 1.2-1.5. Patient's diuretics resumed on 02/05/2019 at a lower dose of 80 mg twice daily and dose increased back to home regimen of 120 mg twice daily.  -Creatinine appear to trend up, decrease Lasix dose to 80 mg twice a day, continue spironolactone, continue fluid restriction -BMP in the morning  Chronic neuropathy -Patient still with complaints of heel pain which likely secondary to neuropathy. Continue gabapentin which is renally dosed. -Report  tremor on increased dose of gabapentin , wants  dose decreased to 100 mg 3 times a day which is ordered  -Patient states took Lyrica before in the past however hallucinated and as such discontinued.  Hypertension -Continue Coreg, spironolactone, Lasix  Hyperlipidemia -Continue Zocor  Asthma/COPD, not on home O2 -Patient with some complaints of shortness of breath which has improved with diuresis. Chest x-ray with bibasilar ill-defined airspace opacity likely atypical organism pneumonia. Lung sounds were clear. Patient recently treated for COVID-19 in November 2020 and likely chest x-ray findings from prior COVID-19 infection.   Breathing treatment prn.  -Upper airway sound intermittently during exam, lung clear  Morbid obesity Estimated body  mass index is 60.56 kg/m  as calculated from the following:   Height as of this encounter: 5\' 2"  (1.575 m).   Weight as of this encounter: 150.2 kg.   Suspect sleep apnea, nocturnal hypoxia Start oxygen supplement during sleep, Trial of CPAP, will obtain cxr for completeness ( no cough, no fever) Patient will benefit from outpatient sleep study, patient is aware  UTI, not POA  -Urinalysis unremarkable -Urine culture showing 80,000 Pseudomonas and Citrobacter -Cipro x 5 days    Constipation: Decrease iron supplement from daily to Monday Wednesday Friday Continue MiraLAX daily, add on suppository  DVT prophylaxis: Subcutaneous heparin Code Status: Full code Family Communication: None at bedside Disposition Plan: SNF placement pending, remains difficult to place   Consultants:   Orthopedic surgery, Dr. [name]   Procedures:  none  Antibiotics:  As above   Objective: BP 115/61 (BP Location: Right Wrist)   Pulse 77   Temp 98.6 F (37 C) (Oral)   Resp 15   Ht 5\' 2"  (1.575 m)   Wt (!) 151.2 kg   SpO2 92%   BMI 60.97 kg/m   Intake/Output Summary (Last 24 hours) at 02/23/2019 1304 Last data filed at 02/23/2019 0100 Gross per 24 hour  Intake 240 ml  Output 2400 ml  Net -2160 ml   Filed Weights   02/18/19 0500 02/22/19 0504 02/23/19 0500  Weight: (!) 150.2 kg (!) 151.9 kg (!) 151.2 kg    Exam: Patient is examined daily including today on 02/23/2019, exams remain the same as of yesterday except that has changed    General:  NAD  Cardiovascular: RRR  Respiratory: CTABL  Abdomen: Soft/ND/NT, positive BS  Musculoskeletal: No Edema, right anterior shin wound appear healing, dressing intact, no edema, does has chronic venous stasis skin changes in lower legs  Neuro: alert, oriented   Data Reviewed: Basic Metabolic Panel: Recent Labs  Lab 02/18/19 0244 02/22/19 0222 02/23/19 0202  NA 136 133* 136  K 4.0 4.6 4.2  CL 92* 89* 90*  CO2 35* 35* 34*  GLUCOSE 127* 131* 147*  BUN  27* 38* 42*  CREATININE 1.44* 1.87* 1.73*  CALCIUM 8.8* 8.8* 9.4   Liver Function Tests: Recent Labs  Lab 02/23/19 0202  AST 21  ALT 15  ALKPHOS 76  BILITOT 0.5  PROT 6.1*  ALBUMIN 3.2*   No results for input(s): LIPASE, AMYLASE in the last 168 hours. No results for input(s): AMMONIA in the last 168 hours. CBC: Recent Labs  Lab 02/23/19 0202  WBC 5.9  NEUTROABS 3.2  HGB 12.1  HCT 38.7  MCV 95.6  PLT 196   Cardiac Enzymes:   No results for input(s): CKTOTAL, CKMB, CKMBINDEX, TROPONINI in the last 168 hours. BNP (last 3 results) Recent Labs    08/15/18 1019 11/09/18 1140 12/06/18 1955  BNP 23.6 84.9 32.5    ProBNP (last 3 results) No results for input(s): PROBNP in the last 8760 hours.  CBG: No results for input(s): GLUCAP in the last 168 hours.  Recent Results (from the past 240 hour(s))  Culture, Urine     Status: Abnormal   Collection Time: 02/15/19  6:15 PM   Specimen: Urine, Random  Result Value Ref Range Status   Specimen Description URINE, RANDOM  Final   Special Requests   Final    NONE Performed at [facility], [address] [place], [place] [address]    Culture (A)  Final    80,000 COLONIES/mL CITROBACTER FREUNDII  80,000 COLONIES/mL PSEUDOMONAS AERUGINOSA    Report Status 02/17/2019 FINAL  Final   Organism ID, Bacteria CITROBACTER FREUNDII (A)  Final   Organism ID, Bacteria PSEUDOMONAS AERUGINOSA (A)  Final      Susceptibility   Citrobacter freundii - MIC*    CEFAZOLIN >=64 RESISTANT Resistant     CEFTRIAXONE >=64 RESISTANT Resistant     CIPROFLOXACIN <=0.25 SENSITIVE Sensitive     GENTAMICIN <=1 SENSITIVE Sensitive     IMIPENEM <=0.25 SENSITIVE Sensitive     NITROFURANTOIN <=16 SENSITIVE Sensitive     TRIMETH/SULFA <=20 SENSITIVE Sensitive     PIP/TAZO 64 INTERMEDIATE Intermediate     * 80,000 COLONIES/mL CITROBACTER FREUNDII   Pseudomonas aeruginosa - MIC*    CEFTAZIDIME >=64 RESISTANT Resistant     CIPROFLOXACIN 1  SENSITIVE Sensitive     GENTAMICIN <=1 SENSITIVE Sensitive     IMIPENEM 2 SENSITIVE Sensitive     PIP/TAZO 32 SENSITIVE Sensitive     CEFEPIME 8 SENSITIVE Sensitive     * 80,000 COLONIES/mL PSEUDOMONAS AERUGINOSA     Studies: No results found.  Scheduled Meds: . allopurinol  100 mg Oral Daily  . aspirin EC  81 mg Oral QHS  . bisacodyl  10 mg Rectal Daily  . carvedilol  12.5 mg Oral BID WC  . colchicine  0.3 mg Oral Daily  . diclofenac sodium  2 g Topical QID  . famotidine  20 mg Oral BID  . [START ON 02/24/2019] ferrous sulfate  325 mg Oral Q M,W,F  . fluticasone furoate-vilanterol  1 puff Inhalation Daily  . furosemide  80 mg Oral BID  . gabapentin  200 mg Oral TID  . heparin  5,000 Units Subcutaneous Q8H  . hydrocortisone cream   Topical BID  . lactobacillus acidophilus  2 tablet Oral TID  . montelukast  10 mg Oral QHS  . Oxcarbazepine  300 mg Oral BID  . polyethylene glycol  17 g Oral Daily  . silver sulfADIAZINE  1 application Topical Daily  . simvastatin  40 mg Oral QHS  . spironolactone  25 mg Oral Daily    Continuous Infusions:   Time spent: 4mins I have personally reviewed and interpreted on  02/23/2019 daily labs, imagings as discussed above under date review session and assessment and plans.  I reviewed all nursing notes, pharmacy notes, consultant notes,  vitals, pertinent old records  I have discussed plan of care as described above with RN , patient  on 02/23/2019   Florencia Reasons MD, PhD, FACP  [facility]  Available via Epic secure chat 7am-7pm for nonurgent issues Please page for urgent issues, pager number available through [email or web address] .   02/23/2019, 1:04 PM  LOS: 21 days

## 2019-02-23 NOTE — Plan of Care (Signed)

## 2019-02-23 NOTE — Progress Notes (Signed)
Nutrition Brief Note  RD was requested to speak with pt by [facility] but to multiple complaints about meal orders.   Wt Readings from Last 15 Encounters:  02/23/19 (!) 151.2 kg  12/08/18 (!) 160.1 kg  11/09/18 (!) 157.4 kg  11/03/18 (!) 157.9 kg  10/28/18 (!) 157.9 kg  09/20/18 (!) 157.9 kg  09/19/18 (!) 159.8 kg  09/08/18 (!) 159.8 kg  08/29/18 (!) 157.9 kg  08/15/18 (!) 158 kg  07/26/18 (!) 163.3 kg  06/21/18 (!) 163.3 kg  06/07/18 (!) 163.3 kg  05/27/18 (!) 163.3 kg  05/23/18 (!) 163.3 kg   April [name] is a 69 y.o. female with medical history significant of hypertension, hyperlipidemia, Dm2, neuropathy, CKD stage 3, CAD , Asthma, morbid obesity, recent Covid infection in November, presented with bilateral lower extremity cellulitis and generalized weakness.  Patient has a chronic wound on the right shin area she has a visiting nurse to clean the wound 3 times a week, and she has chronic ambulation issue, constantly needs a lot of help from her husband for getting mobile.  This week, her husband was diagnosed with COVID-19 and was hospitalized, and patient has been feeling very weak without help to ambulate after few days.  And today it was found that she has new pain and rash around the right shin ulcer area, she also developed a blister on the bottom of the left foot, for which she does not remember she has sustained any trauma, burning.  She has episode of chills at home but no fever, denied any cough, chest pain, no diarrhea no dysuria.  Pt admitted with rt leg and lt foot cellulitis.   Reviewed I/O's: -2.5 L x 24 hours and -20 L since 02/09/19  UOP: 3.5 L x 24 hours  Spoke with RN prior to visit, who reports pt with good appetite but reports frustration with being unable to receive certain food items due to diet restrictions.   Pt very lethargic at time of visit. Unable to awaken to interview. Alerted RN.   Discussed with MD, who agreed to liberalize diet from heart healthy to  2 gram sodium.   Called pt on phone and discussed plan with her; she was appreciative. She confirmed allergies to shellfish, mustard, and pickles, but no mayonnaise per her recent visit to allergist.   Reviewed wt hx; pt has experienced a 4.2% wt loss over the past 6 months, which is not significant for time frame.   Medications reviewed and include miralax.   Labs reviewed: Na: 133.   Body mass index is 60.97 kg/m. Patient meets criteria for morbid obesity, class III based on current BMI.   Current diet order is 2 gram sodium, patient is consuming approximately 100% of meals at this time. Labs and medications reviewed.   No nutrition interventions warranted at this time. If nutrition issues arise, please consult RD.   Abygale Karpf A. Jimmye Norman, RD, LDN, [place] Registered Dietitian II Certified Diabetes Care and Education Specialist Pager: [phone] After hours Pager: [phone]

## 2019-02-23 NOTE — Progress Notes (Addendum)
@   1245 Pt sleeping hard, dietician had difficulty waking. NT reports she sleeps off and on thru day, sleeps hard. Nurse went in to see pt, spoke loudly, pt woke up, able to tell nurse that "I told you that medicine makes me sleepy", pt referring to the neurontin she is on, she had mentioned this morning that the doctor had decreased the dose of it yesterday.  Vitals checked, sats 85-88% RA while sleeping, with "good wave form", placed 2L/[place] on pt, sats up to 95%.  Asked pt if she has ever worn a CPAP to help her breathe while sleeping, she denied.  Dr Erlinda Hong in about 1300, informed of the above. She decreased pt's neurontin again. See her notes.

## 2019-02-24 LAB — BASIC METABOLIC PANEL
Anion gap: 12 (ref 5–15)
BUN: 40 mg/dL — ABNORMAL HIGH (ref 8–23)
CO2: 34 mmol/L — ABNORMAL HIGH (ref 22–32)
Calcium: 9 mg/dL (ref 8.9–10.3)
Chloride: 88 mmol/L — ABNORMAL LOW (ref 98–111)
Creatinine, Ser: 1.72 mg/dL — ABNORMAL HIGH (ref 0.44–1.00)
GFR calc Af Amer: 35 mL/min — ABNORMAL LOW (ref >60)
GFR calc non Af Amer: 30 mL/min — ABNORMAL LOW (ref >60)
Glucose, Bld: 162 mg/dL — ABNORMAL HIGH (ref 70–99)
Potassium: 4.3 mmol/L (ref 3.5–5.1)
Sodium: 134 mmol/L — ABNORMAL LOW (ref 135–145)

## 2019-02-24 LAB — SARS CORONAVIRUS 2 (TAT 6-24 HRS): SARS Coronavirus 2: NEGATIVE

## 2019-02-24 MED ORDER — OXYCODONE HCL 5 MG PO TABS: 5.0000 mg | ORAL_TABLET | Freq: Four times a day (QID) | ORAL | 0 refills | Status: DC | PRN

## 2019-02-24 NOTE — Care Management Important Message (Signed)
Important Message  Patient Details  Name: April [name] MRN: [id number] Date of Birth: August 17, 1950   Medicare Important Message Given:  Yes     Memory Argue 02/24/2019, 1:24 PM

## 2019-02-24 NOTE — Progress Notes (Signed)
Attempted CPAP with Nasal mask at 5cm H2o Pt did not tolerate stating she "could not breathe and now has a headache" . 2L [place] at bedside that she said she will wear when ready for bed. I told her to call RT if she changes her mind.

## 2019-02-24 NOTE — Progress Notes (Signed)
PROGRESS NOTE  April [name] PJK:[id number] DOB: July 14, 1950 DOA: 02/02/2019 PCP: [name], MD  Brief Narrative:  April [name] is a 69 year old female with history of hypertension, hyperlipidemia, diabetes mellitus type 2, neuropathy, chronic kidney disease stage III, CAD, asthma, morbid obesity, recent Covid infection in November requiring hospitalization and treatment with steroids/remdesivir, chronic right shin wound, chronic ambulatory dysfunction presented with worsening weakness along with new pain and rash around the right shin ulcer area along with painful blister on the bottom of the left foot. She has now completed antibiotics for cellulitis. Awaiting SNF placement.     HPI/Recap of past 24 hours:   Feeling better today She complains about limited diet choice on low sodium diet, she wants to switch back to heart health diet Awaiting for snf placement  Assessment/Plan: Active Problems:   Neuropathy   Cellulitis   Non-pressure chronic ulcer of left heel and midfoot limited to breakdown of skin (HCC)   Idiopathic chronic gout of multiple sites without tophus   SOB (shortness of breath)   Acute idiopathic gout of right foot   Morbid obesity (HCC)   Cellulitis surrounding right chronic wound of the shin, POA, and leftplantar foot blister with possible cellulitis, POA  -Patient admitted with cellulitis and plantar foot blister and placed on empiric IV vancomycin and IV Rocephin. -Plain films of bilateral feet as well as right tib-fib showed no acute lesions except some soft tissue swelling of the left foot.  -due to "Ulceration bilateral heels and bilateral posterior thighs status post skin graft to her right leg" ortho consulted -Orthopedics Dr [name] recommended nonweightbearing for both extremities and to continue antibiotics and transition to oral antibiotics on discharge. Patient completed 10-day antibiotics.  -MRSA nasal PCR was negative.  -awaiting for  snf placement  Acute gout flare -Per chart review patient's uric acid level ranged from 10.5-14.8 since July 2020, most recent uric acid level 11.1 from January 2021  -patient with some exquisite tenderness to palpation on the right heel. Patient with a history of uncontrolled gout per orthopedic -Status post Solu-Medrol 60 mg IV x1 on 02/08/2019. Status post 5-day course of oral prednisone 40 mg daily. - Continue allopurinoland colchicine  Acute on chronic ambulatory dysfunction/generalizeddeconditioning -PT recommended SNF. Social work consulted.  Chronic neuropathy -Patient still with complaints of heel pain which likely secondary to neuropathy. Continue gabapentin which is renally dosed. -Report  tremor on increased dose of gabapentin , wants  dose decreased to 100 mg 3 times a day which is ordered  -Patient states took Lyrica before in the past however hallucinated and as such discontinued.  Acute kidney injury on chronic kidney disease stage IIIa -Baseline creatinine fluctuates between 1.2-1.5. Patient's diuretics resumed on 02/05/2019 at a lower dose of 80 mg twice daily and dose increased back to home regimen of 120 mg twice daily.  -Creatinine appear to trend up, decrease Lasix dose to 80 mg twice a day, continue spironolactone, continue fluid restriction -cr peaked at 1.87 , start to slowly trend down, monitor cr, renal dosing meds   UTI, not POA  -Urinalysis unremarkable -Urine culture showing 80,000 Pseudomonas and Citrobacter -Cipro x 5 days    Hypertension -Continue Coreg, spironolactone, Lasix  Hyperlipidemia -Continue Zocor  Asthma/COPD, not on home O2 -Patient with some complaints of shortness of breath which has improved with diuresis. Chest x-ray with bibasilar ill-defined airspace opacity likely atypical organism pneumonia. Lung sounds were clear. Patient recently treated for COVID-19 in November 2020 and  likely chest x-ray findings from prior  COVID-19 infection.   Breathing treatment prn.  -Upper airway sound intermittently during exam, lung clear  Morbid obesity Estimated body mass index is 60.56 kg/m as calculated from the following:   Height as of this encounter: 5\' 2"  (1.575 m).   Weight as of this encounter: 150.2 kg.   Suspect sleep apnea, nocturnal hypoxia Start oxygen supplement 2liters during sleep, Trial of CPAP,   cxr no acute findings, she has  no cough, no fever Patient will benefit from outpatient sleep study, patient is aware   Constipation: Decrease iron supplement from daily to Monday Wednesday Friday Continue MiraLAX daily, add on suppository  DVT prophylaxis: Subcutaneous heparin Code Status: Full code Family Communication: None at bedside Disposition Plan: medically ready to discharge, awaiting for SNF placement    Consultants:   Orthopedic surgery, Dr. [name]   Procedures:  none  Antibiotics:  As above   Objective: BP 130/64 (BP Location: Right Arm)   Pulse 70   Temp 98.3 F (36.8 C)   Resp 17   Ht 5\' 2"  (1.575 m)   Wt (!) 151.2 kg   SpO2 98%   BMI 60.97 kg/m   Intake/Output Summary (Last 24 hours) at 02/24/2019 1512 Last data filed at 02/24/2019 1300 Gross per 24 hour  Intake 360 ml  Output 800 ml  Net -440 ml   Filed Weights   02/18/19 0500 02/22/19 0504 02/23/19 0500  Weight: (!) 150.2 kg (!) 151.9 kg (!) 151.2 kg    Exam: Patient is examined daily including today on 02/24/2019, exams remain the same as of yesterday except that has changed    General:  NAD  Cardiovascular: RRR  Respiratory: CTABL  Abdomen: Soft/ND/NT, positive BS  Musculoskeletal: No Edema, right anterior shin wound appear healing, dressing intact, no edema, does has chronic venous stasis skin changes in lower legs  Neuro: alert, oriented   Data Reviewed: Basic Metabolic Panel: Recent Labs  Lab 02/18/19 0244 02/22/19 0222 02/23/19 0202 02/24/19 0556  NA 136 133* 136 134*  K 4.0  4.6 4.2 4.3  CL 92* 89* 90* 88*  CO2 35* 35* 34* 34*  GLUCOSE 127* 131* 147* 162*  BUN 27* 38* 42* 40*  CREATININE 1.44* 1.87* 1.73* 1.72*  CALCIUM 8.8* 8.8* 9.4 9.0   Liver Function Tests: Recent Labs  Lab 02/23/19 0202  AST 21  ALT 15  ALKPHOS 76  BILITOT 0.5  PROT 6.1*  ALBUMIN 3.2*   No results for input(s): LIPASE, AMYLASE in the last 168 hours. No results for input(s): AMMONIA in the last 168 hours. CBC: Recent Labs  Lab 02/23/19 0202  WBC 5.9  NEUTROABS 3.2  HGB 12.1  HCT 38.7  MCV 95.6  PLT 196   Cardiac Enzymes:   No results for input(s): CKTOTAL, CKMB, CKMBINDEX, TROPONINI in the last 168 hours. BNP (last 3 results) Recent Labs    08/15/18 1019 11/09/18 1140 12/06/18 1955  BNP 23.6 84.9 32.5    ProBNP (last 3 results) No results for input(s): PROBNP in the last 8760 hours.  CBG: No results for input(s): GLUCAP in the last 168 hours.  Recent Results (from the past 240 hour(s))  Culture, Urine     Status: Abnormal   Collection Time: 02/15/19  6:15 PM   Specimen: Urine, Random  Result Value Ref Range Status   Specimen Description URINE, RANDOM  Final   Special Requests   Final    NONE Performed at [facility]  Hospital Lab, [place] [address] [place], [place] [address]    Culture (A)  Final    80,000 COLONIES/mL CITROBACTER FREUNDII 80,000 COLONIES/mL PSEUDOMONAS AERUGINOSA    Report Status 02/17/2019 FINAL  Final   Organism ID, Bacteria CITROBACTER FREUNDII (A)  Final   Organism ID, Bacteria PSEUDOMONAS AERUGINOSA (A)  Final      Susceptibility   Citrobacter freundii - MIC*    CEFAZOLIN >=64 RESISTANT Resistant     CEFTRIAXONE >=64 RESISTANT Resistant     CIPROFLOXACIN <=0.25 SENSITIVE Sensitive     GENTAMICIN <=1 SENSITIVE Sensitive     IMIPENEM <=0.25 SENSITIVE Sensitive     NITROFURANTOIN <=16 SENSITIVE Sensitive     TRIMETH/SULFA <=20 SENSITIVE Sensitive     PIP/TAZO 64 INTERMEDIATE Intermediate     * 80,000 COLONIES/mL CITROBACTER  FREUNDII   Pseudomonas aeruginosa - MIC*    CEFTAZIDIME >=64 RESISTANT Resistant     CIPROFLOXACIN 1 SENSITIVE Sensitive     GENTAMICIN <=1 SENSITIVE Sensitive     IMIPENEM 2 SENSITIVE Sensitive     PIP/TAZO 32 SENSITIVE Sensitive     CEFEPIME 8 SENSITIVE Sensitive     * 80,000 COLONIES/mL PSEUDOMONAS AERUGINOSA     Studies: DG CHEST PORT 1 VIEW  Result Date: 02/23/2019 CLINICAL DATA:  69 year old female with history of shortness of breath today and wheezing. EXAM: PORTABLE CHEST 1 VIEW COMPARISON:  Chest x-ray 02/11/2019. FINDINGS: Lung volumes are low. No consolidative airspace disease. No pleural effusions. No pneumothorax. No pulmonary nodule or mass noted. Pulmonary vasculature and the cardiomediastinal silhouette are within normal limits. Aortic atherosclerosis. IMPRESSION: 1.  No radiographic evidence of acute cardiopulmonary disease. 2. Aortic atherosclerosis. Electronically Signed   By: Vinnie Langton M.D.   On: 02/23/2019 16:41    Scheduled Meds: . allopurinol  100 mg Oral Daily  . aspirin EC  81 mg Oral QHS  . carvedilol  12.5 mg Oral BID WC  . colchicine  0.3 mg Oral Daily  . diclofenac sodium  2 g Topical QID  . famotidine  20 mg Oral BID  . ferrous sulfate  325 mg Oral Q M,W,F  . fluticasone furoate-vilanterol  1 puff Inhalation Daily  . furosemide  80 mg Oral BID  . gabapentin  100 mg Oral TID  . heparin  5,000 Units Subcutaneous Q8H  . hydrocortisone cream   Topical BID  . lactobacillus acidophilus  2 tablet Oral TID  . montelukast  10 mg Oral QHS  . Oxcarbazepine  300 mg Oral BID  . polyethylene glycol  17 g Oral Daily  . silver sulfADIAZINE  1 application Topical Daily  . simvastatin  40 mg Oral QHS  . spironolactone  25 mg Oral Daily    Continuous Infusions:   Time spent: 2mins I have personally reviewed and interpreted on  02/24/2019 daily labs, imagings as discussed above under date review session and assessment and plans.  I reviewed all nursing  notes, pharmacy notes, consultant notes,  vitals, pertinent old records  I have discussed plan of care as described above with RN , patient  on 02/24/2019   Florencia Reasons MD, PhD, FACP  [facility]  Available via Epic secure chat 7am-7pm for nonurgent issues Please page for urgent issues, pager number available through [email or web address] .   02/24/2019, 3:12 PM  LOS: 22 days

## 2019-02-24 NOTE — Clinical Social Work Note (Signed)
Patient continues to be medically ready for discharge. Family has chosen [facility]. CSW has been unable to get in contact with admissions coordinator Freda Munro. TOC leadership aware and actively working on this situation.

## 2019-02-24 NOTE — Progress Notes (Signed)
Attempted to place pt on nasal cpap.  Pt states she can not tolerate it.  That it feels like it is suffocating her.  Pt left on 2L [place]

## 2019-02-24 NOTE — Plan of Care (Signed)

## 2019-02-25 LAB — BASIC METABOLIC PANEL
Anion gap: 11 (ref 5–15)
BUN: 32 mg/dL — ABNORMAL HIGH (ref 8–23)
CO2: 33 mmol/L — ABNORMAL HIGH (ref 22–32)
Calcium: 8.8 mg/dL — ABNORMAL LOW (ref 8.9–10.3)
Chloride: 89 mmol/L — ABNORMAL LOW (ref 98–111)
Creatinine, Ser: 1.56 mg/dL — ABNORMAL HIGH (ref 0.44–1.00)
GFR calc Af Amer: 39 mL/min — ABNORMAL LOW (ref >60)
GFR calc non Af Amer: 34 mL/min — ABNORMAL LOW (ref >60)
Glucose, Bld: 138 mg/dL — ABNORMAL HIGH (ref 70–99)
Potassium: 3.9 mmol/L (ref 3.5–5.1)
Sodium: 133 mmol/L — ABNORMAL LOW (ref 135–145)

## 2019-02-25 MED ORDER — SODIUM CHLORIDE 0.9 % IV SOLN
2.0000 g | Freq: Two times a day (BID) | INTRAVENOUS | Status: DC
  Administered 2019-02-25 – 2019-02-28 (×6): 2 g via INTRAVENOUS
  Filled 2019-02-25 (×6): qty 2

## 2019-02-25 MED ORDER — HYDROCORTISONE (PERIANAL) 2.5 % EX CREA
1.0000 "application " | TOPICAL_CREAM | Freq: Two times a day (BID) | CUTANEOUS | Status: DC
  Administered 2019-02-25 – 2019-03-01 (×8): 1 via RECTAL
  Filled 2019-02-25: qty 28.35

## 2019-02-25 MED ORDER — ESTRADIOL 0.1 MG/GM VA CREA
1.0000 | TOPICAL_CREAM | Freq: Every day | VAGINAL | Status: DC
  Administered 2019-02-25: 1 via VAGINAL
  Filled 2019-02-25: qty 42.5

## 2019-02-25 NOTE — Progress Notes (Signed)
RT placed pt on CPAP dream station for the night. Pt states that she is going to try and tolerate it tonight if possible, that the MD has encouraged her to really try and keep it on overnight. Pt is set on auto titrate max 8 min 4 w/2 Lpm bled into the system. RT will continue to monitor.

## 2019-02-25 NOTE — Progress Notes (Signed)
CSW was alerted by RN that patient wanted to speak with her about SNF placement. Patient informed CSW that she did not want to go to [facility] and wanted to go to [facility]. CSW informed patient that there was not a bed offer from [facility] however would follow up. Patient also informed CSW that her insurance changed to [facility] as of 02/03/19.   CSW spoke with Juliann Pulse at [facility] and was informed that patient was not extended a bed offer due to only having 10 days available SNF days. CSW informed Juliann Pulse of new insurance change. Juliann Pulse stated that if insurance changed that they would be able to accept her on Monday.   Patient supplied CSW with new insurance card.    TOC team will continue to follow for discharge planning needs.

## 2019-02-25 NOTE — Plan of Care (Signed)
  Problem: Clinical Measurements: Goal: Will remain free from infection Outcome: Progressing   Problem: Activity: Goal: Risk for activity intolerance will decrease Outcome: Progressing   Problem: Coping: Goal: Level of anxiety will decrease Outcome: Progressing   Problem: Pain Managment: Goal: General experience of comfort will improve Outcome: Progressing   Problem: Skin Integrity: Goal: Risk for impaired skin integrity will decrease Outcome: Progressing

## 2019-02-25 NOTE — Progress Notes (Addendum)
PROGRESS NOTE  April [name] HYW:[id number] DOB: [birth year] DOA: 02/02/2019 PCP: [name], MD  Brief Narrative:  April [name] is a 69 year old female with history of hypertension, hyperlipidemia, diabetes mellitus type 2, neuropathy, chronic kidney disease stage III, CAD, asthma, morbid obesity, recent Covid infection in November requiring hospitalization and treatment with steroids/remdesivir, chronic right shin wound, chronic ambulatory dysfunction presented with worsening weakness along with new pain and rash around the right shin ulcer area along with painful blister on the bottom of the left foot. She has now completed antibiotics for cellulitis. Awaiting SNF placement.     HPI/Recap of past 24 hours:   She reports persistent dysuria despite antibiotic treatment, no fever Reports vaginal dryness, she is willing to try topical estrogen cream Reports hemorrhoids ,wanting to try hemorrhoids cream as well Due to significant pain on weightbearing on both foot, she is currently nonweightbearing status, Awaiting for snf placement  Assessment/Plan: Active Problems:   Neuropathy   Cellulitis   Non-pressure chronic ulcer of left heel and midfoot limited to breakdown of skin (HCC)   Idiopathic chronic gout of multiple sites without tophus   SOB (shortness of breath)   Acute idiopathic gout of right foot   Morbid obesity ([place])   Cellulitis surrounding right chronic wound of the shin, POA, and leftplantar foot blister with possible cellulitis, POA  -Patient admitted with cellulitis and plantar foot blister and placed on empiric IV vancomycin and IV Rocephin. -Plain films of bilateral feet as well as right tib-fib showed no acute lesions except some soft tissue swelling of the left foot.  -due to "Ulceration bilateral heels and bilateral posterior thighs status post skin graft to her right leg" ortho consulted -Orthopedics Dr [name] recommended nonweightbearing for both  extremities due to significant pain upon weightbearing on both side and to continue antibiotics and transition to oral antibiotics on discharge. Patient completed 10-day antibiotics.  -MRSA nasal PCR was negative.  -awaiting for snf placement, follow-up with Dr. [name]  Acute gout flare -Per chart review patient's uric acid level ranged from 10.5-14.8 since July 2020, most recent uric acid level 11.1 from January 2021  -patient with some exquisite tenderness to palpation on the right heel. Patient with a history of uncontrolled gout per orthopedic -Status post Solu-Medrol 60 mg IV x1 on 02/08/2019. Status post 5-day course of oral prednisone 40 mg daily. - Continue allopurinoland colchicine  Acute on chronic ambulatory dysfunction/generalizeddeconditioning -PT recommended SNF. Social work consulted.  Chronic neuropathy -Patient still with complaints of heel pain which likely secondary to neuropathy. Continue gabapentin which is renally dosed. -Report  tremor on increased dose of gabapentin , wants  dose decreased to 100 mg 3 times a day which is ordered  -Patient states took Lyrica before in the past however hallucinated and as such discontinued.  Acute kidney injury on chronic kidney disease stage IIIa -Baseline creatinine fluctuates between 1.2-1.5. Patient's diuretics resumed on 02/05/2019 at a lower dose of 80 mg twice daily and dose increased back to home regimen of 120 mg twice daily.  -Creatinine appear to trend up, decrease Lasix dose to 80 mg twice a day, continue spironolactone, continue fluid restriction -cr peaked at 1.87 , start to slowly trend down, monitor cr, renal dosing meds   UTI, not POA  -Urinalysis unremarkable -Urine culture showing 80,000 Pseudomonas and Citrobacter -Cipro x 5 days , however continue complaining of dysuria, urine is cloudy, will treat with cefepime   Hypertension -Continue Coreg, spironolactone, Lasix  Hyperlipidemia -Continue  Zocor  Asthma/COPD, not on home O2 -Patient with some complaints of shortness of breath which has improved with diuresis. Chest x-ray with bibasilar ill-defined airspace opacity likely atypical organism pneumonia. Lung sounds were clear. Patient recently treated for COVID-19 in November 2020 and likely chest x-ray findings from prior COVID-19 infection.   Breathing treatment prn.  -Upper airway sound intermittently during exam, lung clear  Morbid obesity Estimated body mass index is 60.56 kg/m as calculated from the following:   Height as of this encounter: 5\' 2"  (1.575 m).   Weight as of this encounter: 150.2 kg.   Suspect sleep apnea, nocturnal hypoxia Start oxygen supplement 2liters during sleep, Trial of CPAP,   cxr no acute findings, she has  no cough, no fever Patient will benefit from outpatient sleep study, patient is aware   Constipation: Decrease iron supplement from daily to Monday Wednesday Friday Continue MiraLAX daily, add on suppository Improved  History of Covid infection in November, treated with remdesivir  DVT prophylaxis: Subcutaneous heparin Code Status: Full code Family Communication: None at bedside Disposition Plan: medically ready to discharge, awaiting for SNF placement    Consultants:   Orthopedic surgery, Dr. [name]   Procedures:  none  Antibiotics:  As above   Objective: BP (!) 110/47 (BP Location: Right Arm)   Pulse 81   Temp (!) 97.5 F (36.4 C) (Oral)   Resp 18   Ht 5\' 2"  (1.575 m)   Wt (!) 152.1 kg   SpO2 96%   BMI 61.33 kg/m   Intake/Output Summary (Last 24 hours) at 02/25/2019 1346 Last data filed at 02/24/2019 1900 Gross per 24 hour  Intake --  Output 500 ml  Net -500 ml   Filed Weights   02/22/19 0504 02/23/19 0500 02/25/19 0500  Weight: (!) 151.9 kg (!) 151.2 kg (!) 152.1 kg    Exam: Patient is examined daily including today on 02/25/2019, exams remain the same as of yesterday except that has changed     General:  NAD  Cardiovascular: RRR  Respiratory: CTABL  Abdomen: Soft/ND/NT, positive BS  Musculoskeletal: No Edema, right anterior shin wound appear healing, dressing intact, no edema, does has chronic venous stasis skin changes in lower legs, a healing foot blister x1 on bottom of the left foot  Neuro: alert, oriented   Data Reviewed: Basic Metabolic Panel: Recent Labs  Lab 02/22/19 0222 02/23/19 0202 02/24/19 0556 02/25/19 0224  NA 133* 136 134* 133*  K 4.6 4.2 4.3 3.9  CL 89* 90* 88* 89*  CO2 35* 34* 34* 33*  GLUCOSE 131* 147* 162* 138*  BUN 38* 42* 40* 32*  CREATININE 1.87* 1.73* 1.72* 1.56*  CALCIUM 8.8* 9.4 9.0 8.8*   Liver Function Tests: Recent Labs  Lab 02/23/19 0202  AST 21  ALT 15  ALKPHOS 76  BILITOT 0.5  PROT 6.1*  ALBUMIN 3.2*   No results for input(s): LIPASE, AMYLASE in the last 168 hours. No results for input(s): AMMONIA in the last 168 hours. CBC: Recent Labs  Lab 02/23/19 0202  WBC 5.9  NEUTROABS 3.2  HGB 12.1  HCT 38.7  MCV 95.6  PLT 196   Cardiac Enzymes:   No results for input(s): CKTOTAL, CKMB, CKMBINDEX, TROPONINI in the last 168 hours. BNP (last 3 results) Recent Labs    08/15/18 1019 11/09/18 1140 12/06/18 1955  BNP 23.6 84.9 32.5    ProBNP (last 3 results) No results for input(s): PROBNP in the last 8760 hours.  CBG: No results for input(s): GLUCAP in the last 168 hours.  Recent Results (from the past 240 hour(s))  Culture, Urine     Status: Abnormal   Collection Time: 02/15/19  6:15 PM   Specimen: Urine, Random  Result Value Ref Range Status   Specimen Description URINE, RANDOM  Final   Special Requests   Final    NONE Performed at [facility], [address], [place], [place] [address]    Culture (A)  Final    80,000 COLONIES/mL CITROBACTER FREUNDII 80,000 COLONIES/mL PSEUDOMONAS AERUGINOSA    Report Status 02/17/2019 FINAL  Final   Organism ID, Bacteria CITROBACTER FREUNDII (A)  Final    Organism ID, Bacteria PSEUDOMONAS AERUGINOSA (A)  Final      Susceptibility   Citrobacter freundii - MIC*    CEFAZOLIN >=64 RESISTANT Resistant     CEFTRIAXONE >=64 RESISTANT Resistant     CIPROFLOXACIN <=0.25 SENSITIVE Sensitive     GENTAMICIN <=1 SENSITIVE Sensitive     IMIPENEM <=0.25 SENSITIVE Sensitive     NITROFURANTOIN <=16 SENSITIVE Sensitive     TRIMETH/SULFA <=20 SENSITIVE Sensitive     PIP/TAZO 64 INTERMEDIATE Intermediate     * 80,000 COLONIES/mL CITROBACTER FREUNDII   Pseudomonas aeruginosa - MIC*    CEFTAZIDIME >=64 RESISTANT Resistant     CIPROFLOXACIN 1 SENSITIVE Sensitive     GENTAMICIN <=1 SENSITIVE Sensitive     IMIPENEM 2 SENSITIVE Sensitive     PIP/TAZO 32 SENSITIVE Sensitive     CEFEPIME 8 SENSITIVE Sensitive     * 80,000 COLONIES/mL PSEUDOMONAS AERUGINOSA  SARS CORONAVIRUS 2 (TAT 6-24 HRS) Nasopharyngeal Nasopharyngeal Swab     Status: None   Collection Time: 02/24/19  2:32 PM   Specimen: Nasopharyngeal Swab  Result Value Ref Range Status   SARS Coronavirus 2 NEGATIVE NEGATIVE Final    Comment: (NOTE) SARS-CoV-2 target nucleic acids are NOT DETECTED. The SARS-CoV-2 RNA is generally detectable in upper and lower respiratory specimens during the acute phase of infection. Negative results do not preclude SARS-CoV-2 infection, do not rule out co-infections with other pathogens, and should not be used as the sole basis for treatment or other patient management decisions. Negative results must be combined with clinical observations, patient history, and epidemiological information. The expected result is Negative. Fact Sheet for Patients: SugarRoll.be Fact Sheet for Healthcare Providers: https://www.woods-mathews.com/ This test is not yet approved or cleared by the [place] FDA and  has been authorized for detection and/or diagnosis of SARS-CoV-2 by FDA under an Emergency Use Authorization (EUA). This EUA will  remain  in effect (meaning this test can be used) for the duration of the COVID-19 declaration under Section 56 4(b)(1) of the Act, 21 U.S.C. section 360bbb-3(b)(1), unless the authorization is terminated or revoked sooner. Performed at [facility], [place] [address], [place], [place] [address]      Studies: No results found.  Scheduled Meds: . allopurinol  100 mg Oral Daily  . aspirin EC  81 mg Oral QHS  . carvedilol  12.5 mg Oral BID WC  . colchicine  0.3 mg Oral Daily  . diclofenac sodium  2 g Topical QID  . estradiol  1 Applicatorful Vaginal QHS  . famotidine  20 mg Oral BID  . ferrous sulfate  325 mg Oral Q M,W,F  . fluticasone furoate-vilanterol  1 puff Inhalation Daily  . furosemide  80 mg Oral BID  . gabapentin  100 mg Oral TID  . heparin  5,000 Units Subcutaneous Q8H  . hydrocortisone  1 application Rectal BID  . hydrocortisone cream   Topical BID  . lactobacillus acidophilus  2 tablet Oral TID  . montelukast  10 mg Oral QHS  . Oxcarbazepine  300 mg Oral BID  . polyethylene glycol  17 g Oral Daily  . silver sulfADIAZINE  1 application Topical Daily  . simvastatin  40 mg Oral QHS  . spironolactone  25 mg Oral Daily    Continuous Infusions: . ceFEPime (MAXIPIME) IV       Time spent: 41mins I have personally reviewed and interpreted on  02/25/2019 daily labs, imagings as discussed above under date review session and assessment and plans.  I reviewed all nursing notes, pharmacy notes, consultant notes,  vitals, pertinent old records  I have discussed plan of care as described above with RN , patient  on 02/25/2019   Florencia Reasons MD, PhD, FACP  [facility]  Available via Epic secure chat 7am-7pm for nonurgent issues Please page for urgent issues, pager number available through [email or web address] .   02/25/2019, 1:46 PM  LOS: 23 days

## 2019-02-26 LAB — BASIC METABOLIC PANEL
Anion gap: 10 (ref 5–15)
BUN: 31 mg/dL — ABNORMAL HIGH (ref 8–23)
CO2: 33 mmol/L — ABNORMAL HIGH (ref 22–32)
Calcium: 8.9 mg/dL (ref 8.9–10.3)
Chloride: 90 mmol/L — ABNORMAL LOW (ref 98–111)
Creatinine, Ser: 1.36 mg/dL — ABNORMAL HIGH (ref 0.44–1.00)
GFR calc Af Amer: 46 mL/min — ABNORMAL LOW (ref >60)
GFR calc non Af Amer: 40 mL/min — ABNORMAL LOW (ref >60)
Glucose, Bld: 149 mg/dL — ABNORMAL HIGH (ref 70–99)
Potassium: 3.9 mmol/L (ref 3.5–5.1)
Sodium: 133 mmol/L — ABNORMAL LOW (ref 135–145)

## 2019-02-26 MED ORDER — ESTRADIOL 0.1 MG/GM VA CREA
1.0000 | TOPICAL_CREAM | Freq: Every day | VAGINAL | Status: DC
  Administered 2019-02-27 – 2019-02-28 (×3): 1 via VAGINAL
  Filled 2019-02-26: qty 42.5

## 2019-02-26 NOTE — Progress Notes (Signed)
Attempted CPAP with patient. She had placed on machine and just couldn't get used to the pressures. Anxiety hit and she wanted it off. Machine was already set for the lowest pressure possible. Will continue to try and explained to patient the importance of CPAP and how it does take time to get used to using it. And encouraged her to continue trying.

## 2019-02-26 NOTE — Plan of Care (Signed)
  Problem: Education: Goal: Knowledge of General Education information will improve Description: Including pain rating scale, medication(s)/side effects and non-pharmacologic comfort measures Outcome: Progressing   Problem: Health Behavior/Discharge Planning: Goal: Ability to manage health-related needs will improve Outcome: Not Progressing   Problem: Activity: Goal: Risk for activity intolerance will decrease Outcome: Not Progressing

## 2019-02-26 NOTE — Plan of Care (Signed)
  Problem: Coping: Goal: Level of anxiety will decrease Outcome: Progressing   Problem: Pain Managment: Goal: General experience of comfort will improve Outcome: Progressing   Problem: Safety: Goal: Ability to remain free from injury will improve Outcome: Progressing   Problem: Skin Integrity: Goal: Risk for impaired skin integrity will decrease Outcome: Progressing   

## 2019-02-26 NOTE — Plan of Care (Signed)
  Problem: Education: Goal: Knowledge of General Education information will improve Description: Including pain rating scale, medication(s)/side effects and non-pharmacologic comfort measures Outcome: Progressing   Problem: Clinical Measurements: Goal: Will remain free from infection Outcome: Progressing   Problem: Activity: Goal: Risk for activity intolerance will decrease Outcome: Progressing   Problem: Coping: Goal: Level of anxiety will decrease Outcome: Progressing   Problem: Pain Managment: Goal: General experience of comfort will improve Outcome: Progressing   Problem: Skin Integrity: Goal: Risk for impaired skin integrity will decrease Outcome: Progressing   Problem: Safety: Goal: Ability to remain free from injury will improve Outcome: Progressing

## 2019-02-26 NOTE — Progress Notes (Signed)
PROGRESS NOTE  April [name] JME:[id number] DOB: [birth year] DOA: 02/02/2019 PCP: [name], MD  Brief Narrative:  April [name] is a 69 year old female with history of hypertension, hyperlipidemia, diabetes mellitus type 2, neuropathy, chronic kidney disease stage III, CAD, asthma, morbid obesity, recent Covid infection in November requiring hospitalization and treatment with steroids/remdesivir, chronic right shin wound, chronic ambulatory dysfunction presented with worsening weakness along with new pain and rash around the right shin ulcer area along with painful blister on the bottom of the left foot. She has now completed antibiotics for cellulitis. Awaiting SNF placement.     HPI/Recap of past 24 hours:   She reports  Dysuria has improved , urine is clear this am Reports vaginal cream and hemorrhoid cream help, want to them to be continued no fever Due to significant pain on weightbearing on both foot, she is currently nonweightbearing status, Awaiting for snf placement  Assessment/Plan: Active Problems:   Neuropathy   Cellulitis   Non-pressure chronic ulcer of left heel and midfoot limited to breakdown of skin (HCC)   Idiopathic chronic gout of multiple sites without tophus   SOB (shortness of breath)   Acute idiopathic gout of right foot   Morbid obesity (HCC)   Cellulitis surrounding right chronic wound of the shin, POA, and leftplantar foot blister with possible cellulitis, POA  -Patient admitted with cellulitis and plantar foot blister and placed on empiric IV vancomycin and IV Rocephin. -Plain films of bilateral feet as well as right tib-fib showed no acute lesions except some soft tissue swelling of the left foot.  -due to "Ulceration bilateral heels and bilateral posterior thighs status post skin graft to her right leg" ortho consulted -Orthopedics Dr [name] recommended nonweightbearing for both extremities due to significant pain upon weightbearing on  both side and to continue antibiotics and transition to oral antibiotics on discharge. Patient completed 10-day antibiotics.  -MRSA nasal PCR was negative.  -awaiting for snf placement, follow-up with Dr. [name]  Acute gout flare -Per chart review patient's uric acid level ranged from 10.5-14.8 since July 2020, most recent uric acid level 11.1 from January 2021  -patient with some exquisite tenderness to palpation on the right heel. Patient with a history of uncontrolled gout per orthopedic -Status post Solu-Medrol 60 mg IV x1 on 02/08/2019. Status post 5-day course of oral prednisone 40 mg daily. - Continue allopurinoland colchicine  Acute on chronic ambulatory dysfunction/generalizeddeconditioning -PT recommended SNF. Social work consulted.  Chronic neuropathy -Patient still with complaints of heel pain which likely secondary to neuropathy. Continue gabapentin which is renally dosed. -Report  tremor on increased dose of gabapentin , wants  dose decreased to 100 mg 3 times a day which is ordered  -Patient states took Lyrica before in the past however hallucinated and as such discontinued.  Acute kidney injury on chronic kidney disease stage IIIa -Baseline creatinine fluctuates between 1.2-1.5. - Patient's diuretics resumed on 02/05/2019 at a lower dose of 80 mg twice daily and dose increased back to home regimen of 120 mg twice daily., then decreased back to 80mg  bid due to elevation of cr -continue spironolactone, continue fluid restriction -cr peaked at 1.87 , start to slowly trend down, monitor cr, renal dosing meds   UTI, not POA  -Urinalysis unremarkable -Urine culture showing 80,000 Pseudomonas and Citrobacter -Cipro x 5 days , however continue complaining of dysuria,  cloudy urine, cefepime started on 1/23, plan for at least three days of cefepime, then reassess  -improving, less  dysuria, urine now is clear   Hypertension -Continue Coreg, spironolactone,  Lasix  Hyperlipidemia -Continue Zocor  Asthma/COPD, not on home O2 -Patient with some complaints of shortness of breath which has improved with diuresis. Chest x-ray with bibasilar ill-defined airspace opacity likely atypical organism pneumonia. Lung sounds were clear. Patient recently treated for COVID-19 in November 2020 and likely chest x-ray findings from prior COVID-19 infection.   Breathing treatment prn.  -Upper airway sound intermittently during exam, lung clear  Morbid obesity Estimated body mass index is 60.56 kg/m as calculated from the following:   Height as of this encounter: 5\' 2"  (1.575 m).   Weight as of this encounter: 150.2 kg.   Suspect sleep apnea, nocturnal hypoxia Start oxygen supplement 2liters during sleep, Trial of CPAP,   cxr no acute findings, she has  no cough, no fever Patient will benefit from outpatient sleep study, patient is aware   Constipation: Decrease iron supplement from daily to Monday Wednesday Friday Continue MiraLAX daily, add on suppository Improved  History of Covid infection in November, treated with remdesivir  DVT prophylaxis: Subcutaneous heparin Code Status: Full code Family Communication: None at bedside Disposition Plan: medically ready to discharge, awaiting for SNF placement    Consultants:   Orthopedic surgery, Dr. [name]   Procedures:  none  Antibiotics:  As above   Objective: BP (!) 113/58 (BP Location: Right Wrist)   Pulse 83   Temp 99.4 F (37.4 C) (Oral)   Resp 20   Ht 5\' 2"  (1.575 m)   Wt (!) 155.6 kg   SpO2 96%   BMI 62.74 kg/m   Intake/Output Summary (Last 24 hours) at 02/26/2019 6712 Last data filed at 02/26/2019 4580 Gross per 24 hour  Intake 200 ml  Output 1300 ml  Net -1100 ml   Filed Weights   02/23/19 0500 02/25/19 0500 02/26/19 0500  Weight: (!) 151.2 kg (!) 152.1 kg (!) 155.6 kg    Exam: Patient is examined daily including today on 02/26/2019, exams remain the same as of  yesterday except that has changed    General:  NAD  Cardiovascular: RRR  Respiratory: CTABL  Abdomen: Soft/ND/NT, positive BS  Musculoskeletal: No Edema, right anterior shin wound appear healing, dressing intact, no edema, does has chronic venous stasis skin changes in lower legs, a healing foot blister x1 on bottom of the left foot  Neuro: alert, oriented   Data Reviewed: Basic Metabolic Panel: Recent Labs  Lab 02/22/19 0222 02/23/19 0202 02/24/19 0556 02/25/19 0224 02/26/19 0633  NA 133* 136 134* 133* 133*  K 4.6 4.2 4.3 3.9 3.9  CL 89* 90* 88* 89* 90*  CO2 35* 34* 34* 33* 33*  GLUCOSE 131* 147* 162* 138* 149*  BUN 38* 42* 40* 32* 31*  CREATININE 1.87* 1.73* 1.72* 1.56* 1.36*  CALCIUM 8.8* 9.4 9.0 8.8* 8.9   Liver Function Tests: Recent Labs  Lab 02/23/19 0202  AST 21  ALT 15  ALKPHOS 76  BILITOT 0.5  PROT 6.1*  ALBUMIN 3.2*   No results for input(s): LIPASE, AMYLASE in the last 168 hours. No results for input(s): AMMONIA in the last 168 hours. CBC: Recent Labs  Lab 02/23/19 0202  WBC 5.9  NEUTROABS 3.2  HGB 12.1  HCT 38.7  MCV 95.6  PLT 196   Cardiac Enzymes:   No results for input(s): CKTOTAL, CKMB, CKMBINDEX, TROPONINI in the last 168 hours. BNP (last 3 results) Recent Labs    08/15/18 1019 11/09/18 1140 12/06/18 1955  BNP 23.6 84.9 32.5    ProBNP (last 3 results) No results for input(s): PROBNP in the last 8760 hours.  CBG: No results for input(s): GLUCAP in the last 168 hours.  Recent Results (from the past 240 hour(s))  SARS CORONAVIRUS 2 (TAT 6-24 HRS) Nasopharyngeal Nasopharyngeal Swab     Status: None   Collection Time: 02/24/19  2:32 PM   Specimen: Nasopharyngeal Swab  Result Value Ref Range Status   SARS Coronavirus 2 NEGATIVE NEGATIVE Final    Comment: (NOTE) SARS-CoV-2 target nucleic acids are NOT DETECTED. The SARS-CoV-2 RNA is generally detectable in upper and lower respiratory specimens during the acute phase of  infection. Negative results do not preclude SARS-CoV-2 infection, do not rule out co-infections with other pathogens, and should not be used as the sole basis for treatment or other patient management decisions. Negative results must be combined with clinical observations, patient history, and epidemiological information. The expected result is Negative. Fact Sheet for Patients: SugarRoll.be Fact Sheet for Healthcare Providers: https://www.woods-mathews.com/ This test is not yet approved or cleared by the [place] FDA and  has been authorized for detection and/or diagnosis of SARS-CoV-2 by FDA under an Emergency Use Authorization (EUA). This EUA will remain  in effect (meaning this test can be used) for the duration of the COVID-19 declaration under Section 56 4(b)(1) of the Act, 21 U.S.C. section 360bbb-3(b)(1), unless the authorization is terminated or revoked sooner. Performed at [facility], [place] [address], [place], [place] [address]      Studies: No results found.  Scheduled Meds: . allopurinol  100 mg Oral Daily  . aspirin EC  81 mg Oral QHS  . carvedilol  12.5 mg Oral BID WC  . colchicine  0.3 mg Oral Daily  . diclofenac sodium  2 g Topical QID  . estradiol  1 Applicatorful Vaginal QHS  . famotidine  20 mg Oral BID  . ferrous sulfate  325 mg Oral Q M,W,F  . fluticasone furoate-vilanterol  1 puff Inhalation Daily  . furosemide  80 mg Oral BID  . gabapentin  100 mg Oral TID  . heparin  5,000 Units Subcutaneous Q8H  . hydrocortisone  1 application Rectal BID  . hydrocortisone cream   Topical BID  . lactobacillus acidophilus  2 tablet Oral TID  . montelukast  10 mg Oral QHS  . Oxcarbazepine  300 mg Oral BID  . polyethylene glycol  17 g Oral Daily  . silver sulfADIAZINE  1 application Topical Daily  . simvastatin  40 mg Oral QHS  . spironolactone  25 mg Oral Daily    Continuous Infusions: . ceFEPime (MAXIPIME) IV  2 g (02/26/19 0159)     Time spent: 88mins I have personally reviewed and interpreted on  02/26/2019 daily labs, imagings as discussed above under date review session and assessment and plans.  I reviewed all nursing notes, pharmacy notes, consultant notes,  vitals, pertinent old records  I have discussed plan of care as described above with RN , patient  on 02/26/2019   Florencia Reasons MD, PhD, FACP  [facility]  Available via Epic secure chat 7am-7pm for nonurgent issues Please page for urgent issues, pager number available through [email or web address] .   02/26/2019, 7:38 AM  LOS: 24 days

## 2019-02-27 LAB — CBC WITH DIFFERENTIAL/PLATELET
Abs Immature Granulocytes: 0.03 10*3/uL (ref 0.00–0.07)
Basophils Absolute: 0.1 10*3/uL (ref 0.0–0.1)
Basophils Relative: 1 %
Eosinophils Absolute: 0.3 10*3/uL (ref 0.0–0.5)
Eosinophils Relative: 6 %
HCT: 36.5 % (ref 36.0–46.0)
Hemoglobin: 11.7 g/dL — ABNORMAL LOW (ref 12.0–15.0)
Immature Granulocytes: 1 %
Lymphocytes Relative: 29 %
Lymphs Abs: 1.3 10*3/uL (ref 0.7–4.0)
MCH: 30.2 pg (ref 26.0–34.0)
MCHC: 32.1 g/dL (ref 30.0–36.0)
MCV: 94.1 fL (ref 80.0–100.0)
Monocytes Absolute: 0.6 10*3/uL (ref 0.1–1.0)
Monocytes Relative: 13 %
Neutro Abs: 2.3 10*3/uL (ref 1.7–7.7)
Neutrophils Relative %: 50 %
Platelets: 160 10*3/uL (ref 150–400)
RBC: 3.88 MIL/uL (ref 3.87–5.11)
RDW: 14.7 % (ref 11.5–15.5)
WBC: 4.5 10*3/uL (ref 4.0–10.5)
nRBC: 0 % (ref 0.0–0.2)

## 2019-02-27 LAB — BASIC METABOLIC PANEL
Anion gap: 12 (ref 5–15)
BUN: 30 mg/dL — ABNORMAL HIGH (ref 8–23)
CO2: 32 mmol/L (ref 22–32)
Calcium: 9.1 mg/dL (ref 8.9–10.3)
Chloride: 92 mmol/L — ABNORMAL LOW (ref 98–111)
Creatinine, Ser: 1.49 mg/dL — ABNORMAL HIGH (ref 0.44–1.00)
GFR calc Af Amer: 41 mL/min — ABNORMAL LOW (ref >60)
GFR calc non Af Amer: 36 mL/min — ABNORMAL LOW (ref >60)
Glucose, Bld: 132 mg/dL — ABNORMAL HIGH (ref 70–99)
Potassium: 4.6 mmol/L (ref 3.5–5.1)
Sodium: 136 mmol/L (ref 135–145)

## 2019-02-27 NOTE — Progress Notes (Signed)
RT assessed pt for CPAP usage tonight. Pt is unable to tolerate for more than 5-10 minutes. Pt is making a good effort to tolerate but states she just cannot breathe while wearing the CPAP. Pt has attempted multiple times over several days and has just not been able to tolerate. Pt respiratory status is stable at this time and pt is in no distress. RT will continue to monitor.

## 2019-02-27 NOTE — Progress Notes (Signed)
[facility] PROGRESS NOTE    Progress Note  Denzil Bristol  HEN:[id number] DOB: 1950-12-04 DOA: 02/02/2019 PCP: [name], MD     Brief Narrative:   April [name] is an 69 y.o. female past medical history of essential hypertension, hyperlipidemia, diabetes mellitus type 2 with neuropathy chronic kidney disease stage III CAD morbid obesity with a recent COVID-19 infection in November that required hospitalization and treatment with steroids and remdesivir, chronic right shin wound and chronic ambulatory dysfunction presents with worsening weakness along with new pain and rash around the right chin and ulcer with painful blisters at the bottom of the left foot likely cellulitis.  She has completed her course of IV antibiotics she is awaiting placement.  Assessment/Plan:   Cellulitis of the left foot with a right chronic wound chin cellulitis present on admission and left plantar foot blisters with possible cellulitis present on admission: Patient was admitted to the hospital she was started on empirically on IV vancomycin and Rocephin, imaging showed no osteomyelitis.  Due to bilateral ulceration of her heels of bilateral posterior thigh skin graft was performed. Orthopedic surgery was consulted and also recommended nonweightbearing for both extremities due to significant pain bilaterally. Patient is awaiting skilled nursing facility placement.  Acute gout flare: She has completed a course 5 days of prednisone. Continue the parenteral colchicine.  Acute on chronic ambulatory dysfunction/generalized deconditioning: Physical therapy was consulted recommended skilled nursing facility, [facility] has been consulted and awaiting skilled.  Chronic neuropathy: Patient is still complaining of heel pain bilaterally secondary to her neuropathy. Continue gabapentin, this medication cannot be increased as she reports tremors when increased.  Acute kidney injury on chronic kidney disease  stage IIIa: With a baseline creatinine of 1.2-1.5.  Continue current dose of Aldactone and Lasix.  UTI: Urine culture showed 80,000 colonies of Pseudomonas and showed 0 bacteria, she completed 5-day course of Cipro.  Essential hypertension: Continue Coreg Aldactone and Lasix.  Hyperlipidemia: Continue asthma.  COPD not on home oxygen: Lungs are clear she remained stable.  Morbid obesity:  Suspected sleep apnea, nocturnal hypoxia: She will need to have a sleep study as an outpatient.  Constipation: Continue MiraLAX and stool softeners and/or suppositories as needed.  RN Pressure Injury Documentation:    Estimated body mass index is 63.23 kg/m as calculated from the following:   Height as of this encounter: 5\' 2"  (1.575 m).   Weight as of this encounter: 156.8 kg.    DVT prophylaxis: lovenox Family Communication:none Disposition Plan/Barrier to D/C: She is awaiting skilled nursing facility placement.  Code Status:     Code Status Orders  (From admission, onward)         Start     Ordered   02/02/19 1904  Full code  Continuous     02/02/19 1908        Code Status History    Date Active Date Inactive Code Status Order ID Comments User Context   12/06/2018 2103 12/10/2018 2012 Full Code [id number]  Jani Gravel, MD ED   11/09/2018 1948 11/16/2018 1959 Full Code [id number]  Shela Leff, MD ED   09/07/2018 2317 09/15/2018 1931 Full Code [id number]  Oswald Hillock, MD Inpatient   08/15/2018 1749 08/23/2018 2330 Full Code [id number]  Vashti Hey, MD Inpatient   05/27/2018 1000 06/03/2018 2012 Full Code [id number]  [name], MD Inpatient   03/30/2018 1725 04/05/2018 1855 Full Code [id number]  [name], MD Inpatient   03/02/2018 1323 03/09/2018  [place] Full Code [id number]  [name], MD Inpatient   03/28/2016 1749 04/02/2016 1851 Full Code [id number]  [name], MD Inpatient   04/11/2015 2206 04/16/2015 1834 Full Code [id number]  [name], MD  Inpatient   Advance Care Planning Activity        IV Access:    Peripheral IV   Procedures and diagnostic studies:   No results found.   Medical Consultants:    None.  Anti-Infectives:   None  Subjective:    Janese Page Zettie Pho patient has multiple complaints from her ankle knees back and shoulders.  Objective:    Vitals:   02/27/19 0500 02/27/19 0506 02/27/19 0813 02/27/19 0815  BP:  127/62  119/69  Pulse:  70 76 77  Resp:   16 17  Temp:  98.2 F (36.8 C)  98.1 F (36.7 C)  TempSrc:  Oral  Oral  SpO2:  100% 100% 100%  Weight: (!) 156.8 kg     Height:       SpO2: 100 % O2 Flow Rate (L/min): 2 L/min   Intake/Output Summary (Last 24 hours) at 02/27/2019 1021 Last data filed at 02/27/2019 0300 Gross per 24 hour  Intake 675.39 ml  Output 1500 ml  Net -824.61 ml   Filed Weights   02/25/19 0500 02/26/19 0500 02/27/19 0500  Weight: (!) 152.1 kg (!) 155.6 kg (!) 156.8 kg    Exam: General exam: In no acute distress. Respiratory system: Good air movement and clear to auscultation. Cardiovascular system: S1 & S2 heard, RRR. Gastrointestinal system: Abdomen is nondistended, soft and nontender.  xtremities: No pedal edema. Skin: No rashes, lesions or ulcers Psychiatry: Judgement and insight appear normal.   Data Reviewed:    Labs: Basic Metabolic Panel: Recent Labs  Lab 02/23/19 0202 02/23/19 0202 02/24/19 6333 02/24/19 0556 02/25/19 0224 02/25/19 0224 02/26/19 5456 02/27/19 0308  NA 136  --  134*  --  133*  --  133* 136  K 4.2   < > 4.3   < > 3.9   < > 3.9 4.6  CL 90*  --  88*  --  89*  --  90* 92*  CO2 34*  --  34*  --  33*  --  33* 32  GLUCOSE 147*  --  162*  --  138*  --  149* 132*  BUN 42*  --  40*  --  32*  --  31* 30*  CREATININE 1.73*  --  1.72*  --  1.56*  --  1.36* 1.49*  CALCIUM 9.4  --  9.0  --  8.8*  --  8.9 9.1   < > = values in this interval not displayed.   GFR Estimated Creatinine Clearance: 52.9 mL/min (A) (by C-G  formula based on SCr of 1.49 mg/dL (H)). Liver Function Tests: Recent Labs  Lab 02/23/19 0202  AST 21  ALT 15  ALKPHOS 76  BILITOT 0.5  PROT 6.1*  ALBUMIN 3.2*   No results for input(s): LIPASE, AMYLASE in the last 168 hours. No results for input(s): AMMONIA in the last 168 hours. Coagulation profile No results for input(s): INR, PROTIME in the last 168 hours. COVID-19 Labs  No results for input(s): DDIMER, FERRITIN, LDH, CRP in the last 72 hours.  Lab Results  Component Value Date   [place] NEGATIVE 02/24/2019   [place] NEGATIVE 02/08/2019   [place] NEGATIVE 02/05/2019   [place] NEGATIVE 02/02/2019    CBC: Recent Labs  Lab 02/23/19  0202 02/27/19 0308  WBC 5.9 4.5  NEUTROABS 3.2 2.3  HGB 12.1 11.7*  HCT 38.7 36.5  MCV 95.6 94.1  PLT 196 160   Cardiac Enzymes: No results for input(s): CKTOTAL, CKMB, CKMBINDEX, TROPONINI in the last 168 hours. BNP (last 3 results) No results for input(s): PROBNP in the last 8760 hours. CBG: No results for input(s): GLUCAP in the last 168 hours. D-Dimer: No results for input(s): DDIMER in the last 72 hours. Hgb A1c: No results for input(s): HGBA1C in the last 72 hours. Lipid Profile: No results for input(s): CHOL, HDL, LDLCALC, TRIG, CHOLHDL, LDLDIRECT in the last 72 hours. Thyroid function studies: No results for input(s): TSH, T4TOTAL, T3FREE, THYROIDAB in the last 72 hours.  Invalid input(s): FREET3 Anemia work up: No results for input(s): VITAMINB12, FOLATE, FERRITIN, TIBC, IRON, RETICCTPCT in the last 72 hours. Sepsis Labs: Recent Labs  Lab 02/23/19 0202 02/27/19 0308  WBC 5.9 4.5   Microbiology Recent Results (from the past 240 hour(s))  SARS CORONAVIRUS 2 (TAT 6-24 HRS) Nasopharyngeal Nasopharyngeal Swab     Status: None   Collection Time: 02/24/19  2:32 PM   Specimen: Nasopharyngeal Swab  Result Value Ref Range Status   SARS Coronavirus 2 NEGATIVE NEGATIVE Final    Comment:  (NOTE) SARS-CoV-2 target nucleic acids are NOT DETECTED. The SARS-CoV-2 RNA is generally detectable in upper and lower respiratory specimens during the acute phase of infection. Negative results do not preclude SARS-CoV-2 infection, do not rule out co-infections with other pathogens, and should not be used as the sole basis for treatment or other patient management decisions. Negative results must be combined with clinical observations, patient history, and epidemiological information. The expected result is Negative. Fact Sheet for Patients: SugarRoll.be Fact Sheet for Healthcare Providers: https://www.woods-mathews.com/ This test is not yet approved or cleared by the [place] FDA and  has been authorized for detection and/or diagnosis of SARS-CoV-2 by FDA under an Emergency Use Authorization (EUA). This EUA will remain  in effect (meaning this test can be used) for the duration of the COVID-19 declaration under Section 56 4(b)(1) of the Act, 21 U.S.C. section 360bbb-3(b)(1), unless the authorization is terminated or revoked sooner. Performed at [facility], [place] [address] [place], [place] [address]      Medications:   . allopurinol  100 mg Oral Daily  . aspirin EC  81 mg Oral QHS  . carvedilol  12.5 mg Oral BID WC  . colchicine  0.3 mg Oral Daily  . diclofenac sodium  2 g Topical QID  . estradiol  1 Applicatorful Vaginal QHS  . famotidine  20 mg Oral BID  . ferrous sulfate  325 mg Oral Q M,W,F  . fluticasone furoate-vilanterol  1 puff Inhalation Daily  . furosemide  80 mg Oral BID  . gabapentin  100 mg Oral TID  . heparin  5,000 Units Subcutaneous Q8H  . hydrocortisone  1 application Rectal BID  . hydrocortisone cream   Topical BID  . lactobacillus acidophilus  2 tablet Oral TID  . montelukast  10 mg Oral QHS  . Oxcarbazepine  300 mg Oral BID  . polyethylene glycol  17 g Oral Daily  . silver sulfADIAZINE  1  application Topical Daily  . simvastatin  40 mg Oral QHS  . spironolactone  25 mg Oral Daily   Continuous Infusions: . ceFEPime (MAXIPIME) IV Stopped (02/27/19 0217)     LOS: 25 days   Charlynne Cousins  [facility]  02/27/2019, 10:21 AM

## 2019-02-28 LAB — BASIC METABOLIC PANEL
Anion gap: 11 (ref 5–15)
BUN: 30 mg/dL — ABNORMAL HIGH (ref 8–23)
CO2: 31 mmol/L (ref 22–32)
Calcium: 9.3 mg/dL (ref 8.9–10.3)
Chloride: 94 mmol/L — ABNORMAL LOW (ref 98–111)
Creatinine, Ser: 1.43 mg/dL — ABNORMAL HIGH (ref 0.44–1.00)
GFR calc Af Amer: 44 mL/min — ABNORMAL LOW (ref >60)
GFR calc non Af Amer: 38 mL/min — ABNORMAL LOW (ref >60)
Glucose, Bld: 123 mg/dL — ABNORMAL HIGH (ref 70–99)
Potassium: 4.4 mmol/L (ref 3.5–5.1)
Sodium: 136 mmol/L (ref 135–145)

## 2019-02-28 NOTE — Progress Notes (Signed)
Physical Therapy Treatment Patient Details Name: April [name] MRN: [id number] DOB: 10-08-1950 Today's Date: 02/28/2019    History of Present Illness Pt is a 69 y.o. female with medical history significant of hypertension, hyperlipidemia, Dm2, neuropathy, CKD stage 3, CAD , Asthma, morbid obesity, recent Covid infection in November, presented with bilateral lower extremity cellulitis and generalized weakness.    PT Comments    Continuing work on functional mobility and activity tolerance;  Still declining OOB to chair with Maximove; Worked on therex EOB, including trunk mobility work   Follow Up Recommendations  SNF     Equipment Recommendations  Other (comment)(TBD at next venue of care)    Recommendations for Other Services       Precautions / Restrictions Precautions Precautions: Fall Restrictions RLE Weight Bearing: Non weight bearing LLE Weight Bearing: Non weight bearing Other Position/Activity Restrictions: Per Dr. Jess Barters consultation note    Mobility  Bed Mobility Overal bed mobility: Needs Assistance Bed Mobility: Supine to Sit;Sit to Supine     Supine to sit: Min assist Sit to supine: Min assist   General bed mobility comments: Incr time, assist to square off hips at EOB; noting improvements in ability to lift her LEs onto bed; bed in trendelenburg to slide up, and she was able to pull self up in bed, holding headboard  Transfers                 General transfer comment: pt continues to refuse use of Maximove to transfer OOB  Ambulation/Gait                 Stairs             Wheelchair Mobility    Modified Rankin (Stroke Patients Only)       Balance     Sitting balance-Leahy Scale: Good Sitting balance - Comments: pt progressing to no UE supports needed and able to maintain upright sitting at EOB with supervision                                    Cognition Arousal/Alertness: Awake/alert Behavior During  Therapy: [facility] for tasks assessed/performed Overall Cognitive Status: Within Functional Limits for tasks assessed                                        Exercises General Exercises - Lower Extremity Long Arc QuadSinclair Ship;Both;10 reps;Seated Other Exercises Other Exercises: seated spine rotation Rnd L x5 reps each Other Exercises: isometric hip abduction against belt, seated, x 5 reps Other Exercises: Cross-body reaches arm to opposite knee with hip flrxion, 5 reps each side    General Comments        Pertinent Vitals/Pain Pain Assessment: 0-10 Pain Score: 7  Pain Location: bilateral LEs, groin Pain Descriptors / Indicators: Grimacing;Guarding Pain Intervention(s): Monitored during session    Home Living                      Prior Function            PT Goals (current goals can now be found in the care plan section) Acute Rehab PT Goals Patient Stated Goal: improve pain PT Goal Formulation: With patient Time For Goal Achievement: 03/08/19 Potential to Achieve Goals: Fair Progress towards PT goals: Progressing toward goals(slowly)  Frequency    Min 1X/week      PT Plan Current plan remains appropriate    Co-evaluation              AM-PAC PT "6 Clicks" Mobility   Outcome Measure  Help needed turning from your back to your side while in a flat bed without using bedrails?: A Lot Help needed moving from lying on your back to sitting on the side of a flat bed without using bedrails?: A Lot Help needed moving to and from a bed to a chair (including a wheelchair)?: Total Help needed standing up from a chair using your arms (e.g., wheelchair or bedside chair)?: Total Help needed to walk in hospital room?: Total Help needed climbing 3-5 steps with a railing? : Total 6 Click Score: 8    End of Session   Activity Tolerance: Patient tolerated treatment well Patient left: in bed;with call bell/phone within reach;with bed alarm set Nurse  Communication: Mobility status PT Visit Diagnosis: Muscle weakness (generalized) (M62.81);Pain Pain - Right/Left: Right Pain - part of body: Leg;Knee     Time: 1237-1310 PT Time Calculation (min) (ACUTE ONLY): 33 min  Charges:  $Therapeutic Exercise: 8-22 mins $Therapeutic Activity: 8-22 mins                     Roney Marion, PT  Acute Rehabilitation Services Pager [phone] Office [phone]    [name] 02/28/2019, 3:32 PM

## 2019-02-28 NOTE — Plan of Care (Signed)

## 2019-02-28 NOTE — Progress Notes (Signed)
[facility] PROGRESS NOTE    Progress Note  April [name]  ZYS:[id number] DOB: Jul 25, 1950 DOA: 02/02/2019 PCP: [name], MD     Brief Narrative:   April [name] is an 69 y.o. female past medical history of essential hypertension, hyperlipidemia, diabetes mellitus type 2 with neuropathy chronic kidney disease stage III CAD morbid obesity with a recent COVID-19 infection in November that required hospitalization and treatment with steroids and remdesivir, chronic right shin wound and chronic ambulatory dysfunction presents with worsening weakness along with new pain and rash around the right chin and ulcer with painful blisters at the bottom of the left foot likely cellulitis.  She has completed her course of IV antibiotics she is awaiting placement.  Assessment/Plan:   Cellulitis of the left foot with a right chronic wound chin cellulitis present on admission and left plantar foot blisters with possible cellulitis present on admission: Patient was admitted to the hospital she was started on empirically on IV vancomycin and Rocephin, imaging showed no osteomyelitis.  Due to bilateral ulceration of her heels of bilateral posterior thigh skin graft was performed. Orthopedic surgery was consulted and also recommended nonweightbearing for both extremities due to significant pain bilaterally. Patient is awaiting skilled nursing facility placement.  Acute gout flare: She has completed a course 5 days of prednisone. Continue allopurinol and colchicine.  Acute on chronic ambulatory dysfunction/generalized deconditioning: Physical therapy was consulted recommended skilled nursing facility, [facility] has been consulted and awaiting skilled.  Chronic neuropathy: Patient is still complaining of heel pain bilaterally secondary to her neuropathy. Continue gabapentin, this medication cannot be increased as she reports tremors when increased.  Acute kidney injury on chronic kidney disease  stage IIIa: With a baseline creatinine of 1.2-1.5.  Continue current dose of Aldactone and Lasix.  UTI: Urine culture showed 80,000 colonies of Pseudomonas and showed 0 bacteria, she completed 5-day course of Cipro.  Essential hypertension: Continue Coreg Aldactone and Lasix.  Hyperlipidemia: Continue asthma.  COPD not on home oxygen: Lungs are clear she remained stable.  Morbid obesity:  Suspected sleep apnea, nocturnal hypoxia: She will need to have a sleep study as an outpatient.  Constipation: Continue MiraLAX and stool softeners and/or suppositories as needed.  RN Pressure Injury Documentation:    Estimated body mass index is 63.23 kg/m as calculated from the following:   Height as of this encounter: 5\' 2"  (1.575 m).   Weight as of this encounter: 156.8 kg.    DVT prophylaxis: lovenox Family Communication:none Disposition Plan/Barrier to D/C: She is awaiting skilled nursing facility placement.  Code Status:     Code Status Orders  (From admission, onward)         Start     Ordered   02/02/19 1904  Full code  Continuous     02/02/19 1908        Code Status History    Date Active Date Inactive Code Status Order ID Comments User Context   12/06/2018 2103 12/10/2018 2012 Full Code [id number]  Jani Gravel, MD ED   11/09/2018 1948 11/16/2018 1959 Full Code [id number]  Shela Leff, MD ED   09/07/2018 2317 09/15/2018 1931 Full Code [id number]  Oswald Hillock, MD Inpatient   08/15/2018 1749 08/23/2018 2330 Full Code [id number]  Vashti Hey, MD Inpatient   05/27/2018 1000 06/03/2018 2012 Full Code [id number]  [name], MD Inpatient   03/30/2018 1725 04/05/2018 1855 Full Code [id number]  [name], MD Inpatient   03/02/2018 1323 03/09/2018  [place] Full Code [id number]  [name], MD Inpatient   03/28/2016 1749 04/02/2016 1851 Full Code [id number]  [name], MD Inpatient   04/11/2015 2206 04/16/2015 1834 Full Code [id number]  [name], MD  Inpatient   Advance Care Planning Activity        IV Access:    Peripheral IV   Procedures and diagnostic studies:   No results found.   Medical Consultants:    None.  Anti-Infectives:   None  Subjective:    April [name] complaining of itchy back  Objective:    Vitals:   02/27/19 0815 02/27/19 1921 02/28/19 0401 02/28/19 0741  BP: 119/69 116/90 (!) 129/50 125/61  Pulse: 77 82 78 81  Resp: 17 17 17 17   Temp: 98.1 F (36.7 C) 99.4 F (37.4 C) 98.4 F (36.9 C) 98.4 F (36.9 C)  TempSrc: Oral Oral Oral Oral  SpO2: 100% 96% 96% 90%  Weight:      Height:       SpO2: 90 % O2 Flow Rate (L/min): 2 L/min   Intake/Output Summary (Last 24 hours) at 02/28/2019 0815 Last data filed at 02/28/2019 0100 Gross per 24 hour  Intake --  Output 350 ml  Net -350 ml   Filed Weights   02/25/19 0500 02/26/19 0500 02/27/19 0500  Weight: (!) 152.1 kg (!) 155.6 kg (!) 156.8 kg    Exam: General exam: In no acute distress. Respiratory system: Good air movement and clear to auscultation. Cardiovascular system: S1 & S2 heard, RRR. Gastrointestinal system: Abdomen is nondistended, soft and nontender.  xtremities: No pedal edema. Skin: No rashes, lesions or ulcers Psychiatry: Judgement and insight appear normal.   Data Reviewed:    Labs: Basic Metabolic Panel: Recent Labs  Lab 02/24/19 0556 02/24/19 0556 02/25/19 0224 02/25/19 0224 02/26/19 8413 02/26/19 2440 02/27/19 0308 02/28/19 0252  NA 134*  --  133*  --  133*  --  136 136  K 4.3   < > 3.9   < > 3.9   < > 4.6 4.4  CL 88*  --  89*  --  90*  --  92* 94*  CO2 34*  --  33*  --  33*  --  32 31  GLUCOSE 162*  --  138*  --  149*  --  132* 123*  BUN 40*  --  32*  --  31*  --  30* 30*  CREATININE 1.72*  --  1.56*  --  1.36*  --  1.49* 1.43*  CALCIUM 9.0  --  8.8*  --  8.9  --  9.1 9.3   < > = values in this interval not displayed.   GFR Estimated Creatinine Clearance: 55.2 mL/min (A) (by C-G formula  based on SCr of 1.43 mg/dL (H)). Liver Function Tests: Recent Labs  Lab 02/23/19 0202  AST 21  ALT 15  ALKPHOS 76  BILITOT 0.5  PROT 6.1*  ALBUMIN 3.2*   No results for input(s): LIPASE, AMYLASE in the last 168 hours. No results for input(s): AMMONIA in the last 168 hours. Coagulation profile No results for input(s): INR, PROTIME in the last 168 hours. COVID-19 Labs  No results for input(s): DDIMER, FERRITIN, LDH, CRP in the last 72 hours.  Lab Results  Component Value Date   [place]ARSCOV2NAA NEGATIVE 02/24/2019   [place] NEGATIVE 02/08/2019   [place] NEGATIVE 02/05/2019   [place] NEGATIVE 02/02/2019    CBC: Recent Labs  Lab 02/23/19 0202 02/27/19 0308  WBC 5.9 4.5  NEUTROABS 3.2 2.3  HGB 12.1 11.7*  HCT 38.7 36.5  MCV 95.6 94.1  PLT 196 160   Cardiac Enzymes: No results for input(s): CKTOTAL, CKMB, CKMBINDEX, TROPONINI in the last 168 hours. BNP (last 3 results) No results for input(s): PROBNP in the last 8760 hours. CBG: No results for input(s): GLUCAP in the last 168 hours. D-Dimer: No results for input(s): DDIMER in the last 72 hours. Hgb A1c: No results for input(s): HGBA1C in the last 72 hours. Lipid Profile: No results for input(s): CHOL, HDL, LDLCALC, TRIG, CHOLHDL, LDLDIRECT in the last 72 hours. Thyroid function studies: No results for input(s): TSH, T4TOTAL, T3FREE, THYROIDAB in the last 72 hours.  Invalid input(s): FREET3 Anemia work up: No results for input(s): VITAMINB12, FOLATE, FERRITIN, TIBC, IRON, RETICCTPCT in the last 72 hours. Sepsis Labs: Recent Labs  Lab 02/23/19 0202 02/27/19 0308  WBC 5.9 4.5   Microbiology Recent Results (from the past 240 hour(s))  SARS CORONAVIRUS 2 (TAT 6-24 HRS) Nasopharyngeal Nasopharyngeal Swab     Status: None   Collection Time: 02/24/19  2:32 PM   Specimen: Nasopharyngeal Swab  Result Value Ref Range Status   SARS Coronavirus 2 NEGATIVE NEGATIVE Final    Comment: (NOTE) SARS-CoV-2 target  nucleic acids are NOT DETECTED. The SARS-CoV-2 RNA is generally detectable in upper and lower respiratory specimens during the acute phase of infection. Negative results do not preclude SARS-CoV-2 infection, do not rule out co-infections with other pathogens, and should not be used as the sole basis for treatment or other patient management decisions. Negative results must be combined with clinical observations, patient history, and epidemiological information. The expected result is Negative. Fact Sheet for Patients: SugarRoll.be Fact Sheet for Healthcare Providers: https://www.woods-mathews.com/ This test is not yet approved or cleared by the [place] FDA and  has been authorized for detection and/or diagnosis of SARS-CoV-2 by FDA under an Emergency Use Authorization (EUA). This EUA will remain  in effect (meaning this test can be used) for the duration of the COVID-19 declaration under Section 56 4(b)(1) of the Act, 21 U.S.C. section 360bbb-3(b)(1), unless the authorization is terminated or revoked sooner. Performed at [facility], [place] [address] [place], [place] [address]      Medications:   . allopurinol  100 mg Oral Daily  . aspirin EC  81 mg Oral QHS  . carvedilol  12.5 mg Oral BID WC  . colchicine  0.3 mg Oral Daily  . diclofenac sodium  2 g Topical QID  . estradiol  1 Applicatorful Vaginal QHS  . famotidine  20 mg Oral BID  . ferrous sulfate  325 mg Oral Q M,W,F  . fluticasone furoate-vilanterol  1 puff Inhalation Daily  . furosemide  80 mg Oral BID  . gabapentin  100 mg Oral TID  . heparin  5,000 Units Subcutaneous Q8H  . hydrocortisone  1 application Rectal BID  . hydrocortisone cream   Topical BID  . lactobacillus acidophilus  2 tablet Oral TID  . montelukast  10 mg Oral QHS  . Oxcarbazepine  300 mg Oral BID  . polyethylene glycol  17 g Oral Daily  . silver sulfADIAZINE  1 application Topical Daily  .  simvastatin  40 mg Oral QHS  . spironolactone  25 mg Oral Daily   Continuous Infusions:    LOS: 26 days   Charlynne Cousins  [facility]  02/28/2019, 8:15 AM

## 2019-02-28 NOTE — Progress Notes (Signed)
Pt refusing cpap for the night. Rt will continue to monitor as needed.

## 2019-02-28 NOTE — Progress Notes (Signed)
Iv team restarted iv site- unable to infuse  antibiotic in new site and pt reports "hurting" at site- message on call to inquire about transitioning to oral

## 2019-02-28 NOTE — TOC Progression Note (Signed)
Transition of Care [facility]) - Progression Note    Patient Details  Name: April [name] MRN: [id number] Date of Birth: 04/27/1950  Transition of Care [facility]) CM/SW [place], [place] Phone Number: 02/28/2019, 4:16 PM  Clinical Narrative:     CSW spoke with [facility] and they are willing to accept patient tomorrow 1/27. They have ordered a bariatric bed and anticipate discharge tomorrow.   CSW informed patient and she is agreeable. New [facility] policy is in place.  Expected Discharge Plan: [place] Barriers to Discharge: Continued Medical Work up  Expected Discharge Plan and Services Expected Discharge Plan: [place] In-house Referral: Clinical Social Work   Post Acute Care Choice: [place] Living arrangements for the past 2 months: Single Family Home                                       Social Determinants of Health (SDOH) Interventions    Readmission Risk Interventions No flowsheet data found.

## 2019-03-01 ENCOUNTER — Other Ambulatory Visit: Payer: Self-pay | Admitting: Family Medicine

## 2019-03-01 DIAGNOSIS — M6281 Muscle weakness (generalized): Secondary | ICD-10-CM | POA: Diagnosis not present

## 2019-03-01 DIAGNOSIS — Z8616 Personal history of COVID-19: Secondary | ICD-10-CM | POA: Diagnosis not present

## 2019-03-01 DIAGNOSIS — R609 Edema, unspecified: Secondary | ICD-10-CM | POA: Diagnosis not present

## 2019-03-01 DIAGNOSIS — M109 Gout, unspecified: Secondary | ICD-10-CM | POA: Diagnosis not present

## 2019-03-01 DIAGNOSIS — I10 Essential (primary) hypertension: Secondary | ICD-10-CM | POA: Diagnosis not present

## 2019-03-01 DIAGNOSIS — E785 Hyperlipidemia, unspecified: Secondary | ICD-10-CM | POA: Diagnosis not present

## 2019-03-01 DIAGNOSIS — Z7401 Bed confinement status: Secondary | ICD-10-CM | POA: Diagnosis not present

## 2019-03-01 DIAGNOSIS — Q2546 Tortuous aortic arch: Secondary | ICD-10-CM | POA: Diagnosis not present

## 2019-03-01 DIAGNOSIS — Z20828 Contact with and (suspected) exposure to other viral communicable diseases: Secondary | ICD-10-CM | POA: Diagnosis not present

## 2019-03-01 DIAGNOSIS — L03115 Cellulitis of right lower limb: Secondary | ICD-10-CM | POA: Diagnosis not present

## 2019-03-01 DIAGNOSIS — J9811 Atelectasis: Secondary | ICD-10-CM | POA: Diagnosis not present

## 2019-03-01 DIAGNOSIS — R29898 Other symptoms and signs involving the musculoskeletal system: Secondary | ICD-10-CM | POA: Diagnosis not present

## 2019-03-01 DIAGNOSIS — R05 Cough: Secondary | ICD-10-CM | POA: Diagnosis not present

## 2019-03-01 DIAGNOSIS — Z743 Need for continuous supervision: Secondary | ICD-10-CM | POA: Diagnosis not present

## 2019-03-01 DIAGNOSIS — M2550 Pain in unspecified joint: Secondary | ICD-10-CM | POA: Diagnosis not present

## 2019-03-01 DIAGNOSIS — E1140 Type 2 diabetes mellitus with diabetic neuropathy, unspecified: Secondary | ICD-10-CM | POA: Diagnosis not present

## 2019-03-01 DIAGNOSIS — J449 Chronic obstructive pulmonary disease, unspecified: Secondary | ICD-10-CM | POA: Diagnosis not present

## 2019-03-01 DIAGNOSIS — I2510 Atherosclerotic heart disease of native coronary artery without angina pectoris: Secondary | ICD-10-CM | POA: Diagnosis not present

## 2019-03-01 DIAGNOSIS — L0390 Cellulitis, unspecified: Secondary | ICD-10-CM | POA: Diagnosis not present

## 2019-03-01 DIAGNOSIS — Z1383 Encounter for screening for respiratory disorder NEC: Secondary | ICD-10-CM | POA: Diagnosis not present

## 2019-03-01 DIAGNOSIS — L97919 Non-pressure chronic ulcer of unspecified part of right lower leg with unspecified severity: Secondary | ICD-10-CM | POA: Diagnosis not present

## 2019-03-01 DIAGNOSIS — N183 Chronic kidney disease, stage 3 unspecified: Secondary | ICD-10-CM | POA: Diagnosis not present

## 2019-03-01 MED ORDER — FUROSEMIDE 80 MG PO TABS: 80.0000 mg | ORAL_TABLET | Freq: Two times a day (BID) | ORAL | 0 refills | Status: DC

## 2019-03-01 MED ORDER — ESTRADIOL 0.1 MG/GM VA CREA: 1.0000 | TOPICAL_CREAM | Freq: Every day | VAGINAL | 12 refills | Status: DC

## 2019-03-01 MED ORDER — POLYETHYLENE GLYCOL 3350 17 G PO PACK: 17.0000 g | PACK | Freq: Every day | ORAL | 0 refills | Status: DC

## 2019-03-01 MED ORDER — HYDROCORTISONE (PERIANAL) 2.5 % EX CREA: 1.0000 "application " | TOPICAL_CREAM | Freq: Two times a day (BID) | CUTANEOUS | 0 refills | Status: DC

## 2019-03-01 MED ORDER — SPIRONOLACTONE 25 MG PO TABS: 25.0000 mg | ORAL_TABLET | Freq: Every day | ORAL | 0 refills | Status: DC

## 2019-03-01 MED ORDER — SENNOSIDES-DOCUSATE SODIUM 8.6-50 MG PO TABS: 1.0000 | ORAL_TABLET | Freq: Every evening | ORAL | 0 refills | Status: DC | PRN

## 2019-03-01 MED ORDER — COLCHICINE 0.6 MG PO TABS: 0.3000 mg | ORAL_TABLET | Freq: Every day | ORAL | 1 refills | Status: DC

## 2019-03-01 MED ORDER — HYDROXYZINE HCL 25 MG PO TABS: 25.0000 mg | ORAL_TABLET | Freq: Three times a day (TID) | ORAL | 0 refills | Status: DC | PRN

## 2019-03-01 MED ORDER — BACID PO TABS: 2.0000 | ORAL_TABLET | Freq: Three times a day (TID) | ORAL | 0 refills | Status: DC

## 2019-03-01 MED ORDER — CARVEDILOL 12.5 MG PO TABS: 12.5000 mg | ORAL_TABLET | Freq: Two times a day (BID) | ORAL | 0 refills | Status: DC

## 2019-03-01 MED ORDER — FERROUS SULFATE 325 (65 FE) MG PO TABS: 325.0000 mg | ORAL_TABLET | ORAL | 0 refills | Status: DC

## 2019-03-01 NOTE — Care Management Important Message (Signed)
Important Message  Patient Details  Name: April [name] MRN: [id number] Date of Birth: Nov 05, 1950   Medicare Important Message Given:  Yes     Memory Argue 03/01/2019, 11:05 AM

## 2019-03-01 NOTE — Progress Notes (Signed)
Wound care completed, dressing changed to right lower extremity, wound is dry with no drainage, pink wound bed.  Non stick bandage and gauze secured with tape.

## 2019-03-01 NOTE — Progress Notes (Signed)
Patient stated that she no longer wanted to go to [facility]. States that she looked at their reviews and did not like. States that she wants to speak to the Education officer, museum. Wants to look into [facility].

## 2019-03-01 NOTE — TOC Transition Note (Signed)
Transition of Care [facility]) - CM/SW Discharge Note   Patient Details  Name: April [name] MRN: [id number] Date of Birth: [birth year]  Transition of Care [facility]) CM/SW Contact:  Atilano Median, LCSW Phone Number: 03/01/2019, 2:03 PM   Clinical Narrative:    Discharged to SNF. Referral coordinated with Freda Munro. Number to call report [phone] given to unit RN Doroteo Bradford.  Patient aware that there are no other bed offers at this time due to payor source and weight.  No other needs at this time. Case closed to this CSW.    Final next level of care: [place] Barriers to Discharge: Continued Medical Work up   Patient Goals and CMS Choice Patient states their goals for this hospitalization and ongoing recovery are:: Stay off my foot and get better so I can return home CMS Medicare.gov Compare Post Acute Care list provided to:: Patient Choice offered to / list presented to : Patient  Discharge Placement              Patient chooses bed at: [facility] Patient to be transferred to facility by: [place] Name of family member notified: patient Patient and family notified of of transfer: 03/01/19  Discharge Plan and Services In-house Referral: Clinical Social Work   Post Acute Care Choice: [place]                               Social Determinants of Health (SDOH) Interventions     Readmission Risk Interventions No flowsheet data found.

## 2019-03-01 NOTE — Discharge Summary (Signed)
Physician Discharge Summary  Patient ID: April [name] MRN: [id number] DOB/AGE: [birth year] 69 y.o.  Admit date: 02/02/2019 Discharge date: 03/01/2019  Admission Diagnoses: cellulitis of the right shin and left foot  Discharge Diagnoses:  Principal Problem:   Cellulitis Active Problems:   Neuropathy   Non-pressure chronic ulcer of left heel and midfoot limited to breakdown of skin (HCC)   Idiopathic chronic gout of multiple sites without tophus   SOB (shortness of breath)   Acute idiopathic gout of right foot   Morbid obesity (HCC)   Discharged Condition: stable  Hospital Course: Adrianna Page Radwan is an 69 y.o. female past medical history of essential hypertension, hyperlipidemia, diabetes mellitus type 2 with neuropathy chronic kidney disease stage III CAD morbid obesity with a recent COVID-19 infection in November that required hospitalization and treatment with steroids and remdesivir, chronic right shin wound and chronic ambulatory dysfunction presents with worsening weakness along with new pain and rash around the right chin and ulcer with painful blisters at the bottom of the left foot likely cellulitis.  She has completed her course of 10 days of IV antibiotics (Clindamycin then Vanc/Rocephin then transitioned to Cefepime). Patient also treated for gout flour during admission. Completed 5 day course of Cipro for presumed UTI. Patient evaluated by PT and SNF placement recommended.   Consults: orthopedic surgery  Significant Diagnostic Studies:  Plain films of bilateral feet as well as right tib-fib showed no acute lesions except some soft tissue swelling of the left foot.  AKI on admission: Cr was 1.6 and then improved to 1.1 at lowest point.   Treatments: antibiotics: Clindamycin > vancomycin, ceftriaxone > Cefepime  Discharge Exam: Blood pressure 117/64, pulse 83, temperature 98.3 F (36.8 C), temperature source Oral, resp. rate 17, height 5\' 2"  (1.575 m), weight (!)  156.8 kg, SpO2 90 %. General exam: In no acute distress; AAOx4. Morbidly obese. Respiratory system: Good air movement and clear to auscultation. Cardiovascular system: S1 & S2 heard, RRR. Gastrointestinal system: Abdomen is nondistended, soft and nontender.  xtremities: No pedal edema. Skin: No rashes, lesions or ulcers; chronic venous stasis of lower extremities but no warmth, swelling appreciated bilaterally to LE. Psychiatry: Judgement and insight appear normal.  Disposition: Discharge disposition: 03-Skilled Nursing Facility       Discharge Instructions    Diet - low sodium heart healthy   Complete by: As directed    Increase activity slowly   Complete by: As directed      Allergies as of 03/01/2019      Reactions   Penicillins Rash, Other (See Comments)   She was told not to take it anymore. Has patient had a PCN reaction causing immediate rash, facial/tongue/throat swelling, SOB or lightheadedness with hypotension: Yes Has patient had a PCN reaction causing severe rash involving mucus membranes or skin necrosis: No Has patient had a PCN reaction that required hospitalization: No Has patient had a PCN reaction occurring within the last 10 years: No If all of the above answers are "NO", then may proceed with Cephalosporin use.'   Shellfish Allergy Anaphylaxis   Sulfonamide Derivatives Anaphylaxis, Other (See Comments)   Hydrochlorothiazide W-triamterene Other (See Comments)   Hypokalemia   Lotensin [benazepril Hcl] Hives   Dipyridamole Other (See Comments)   Unknown reaction   Estrogens Other (See Comments)   Unknown reaction   Hydrochlorothiazide Other (See Comments)   Unknown reaction   Metronidazole Other (See Comments)   Unknown reaction   Other Itching   PICKLES  Spironolactone Other (See Comments)   UNSPECIFIED > "kidney problems"   Sulfa Antibiotics Other (See Comments)   Unknown reaction   Torsemide Other (See Comments)   Unknown reaction   Valsartan  Other (See Comments)   Unknown reaction   Mustard [allyl Isothiocyanate] Itching      Medication List    STOP taking these medications   traMADol 50 MG tablet Commonly known as: ULTRAM     TAKE these medications   acetaminophen 325 MG tablet Commonly known as: TYLENOL Take 2 tablets (650 mg total) by mouth every 6 (six) hours as needed for moderate pain (or Fever >/= 101).   allopurinol 100 MG tablet Commonly known as: ZYLOPRIM Take 1 tablet (100 mg total) by mouth daily.   budesonide-formoterol 160-4.5 MCG/ACT inhaler Commonly known as: Symbicort one - two inhalations every 12 hours; gargle and spit after use What changed:   how much to take  how to take this  when to take this  additional instructions   calcium carbonate 500 MG chewable tablet Commonly known as: TUMS - dosed in mg elemental calcium Chew 2 tablets by mouth daily as needed for indigestion or heartburn.   carvedilol 12.5 MG tablet Commonly known as: COREG Take 1 tablet (12.5 mg total) by mouth 2 (two) times daily with a meal. What changed:   medication strength  See the new instructions.   cholecalciferol 25 MCG (1000 UNIT) tablet Commonly known as: VITAMIN D3 Take 1,000 Units by mouth daily.   COD LIVER OIL PO Take 1 capsule by mouth daily.   colchicine 0.6 MG tablet Take 0.5 tablets (0.3 mg total) by mouth daily. What changed: how much to take   cyanocobalamin 1000 MCG/ML injection Commonly known as: (VITAMIN B-12) INJECT 1ML INTO MUSCLE EVERY 30 DAYS What changed:   how much to take  how to take this  when to take this  additional instructions   diclofenac sodium 1 % Gel Commonly known as: VOLTAREN Apply 2 g topically 4 (four) times daily.   Ecotrin Low Strength 81 MG EC tablet Generic drug: aspirin Take 81 mg by mouth at bedtime.   estradiol 0.1 MG/GM vaginal cream Commonly known as: ESTRACE Place 1 Applicatorful vaginally at bedtime.   famotidine 20 MG  tablet Commonly known as: Pepcid Take 1 tablet (20 mg total) by mouth 2 (two) times daily.   ferrous sulfate 325 (65 FE) MG tablet Take 1 tablet (325 mg total) by mouth every Monday, Wednesday, and Friday. Start taking on: March 03, 2019 What changed: when to take this   folic acid 160 MCG tablet Commonly known as: FOLVITE Take 400 mcg by mouth at bedtime.   furosemide 80 MG tablet Commonly known as: LASIX Take 1 tablet (80 mg total) by mouth 2 (two) times daily. What changed:   how much to take  additional instructions   gabapentin 100 MG capsule Commonly known as: Neurontin Take 1 capsule (100 mg total) by mouth 3 (three) times daily.   guaiFENesin-dextromethorphan 100-10 MG/5ML syrup Commonly known as: ROBITUSSIN DM Take 5 mLs by mouth every 6 (six) hours as needed for cough.   hydrocortisone 2.5 % rectal cream Commonly known as: ANUSOL-HC Place 1 application rectally 2 (two) times daily.   hydrOXYzine 25 MG tablet Commonly known as: ATARAX/VISTARIL Take 1 tablet (25 mg total) by mouth 3 (three) times daily as needed for itching.   lactobacillus acidophilus Tabs tablet Take 2 tablets by mouth 3 (three) times daily.  montelukast 10 MG tablet Commonly known as: SINGULAIR Take 1 tablet (10 mg total) by mouth at bedtime.   Oxcarbazepine 300 MG tablet Commonly known as: TRILEPTAL Take 1 tablet (300 mg total) by mouth 2 (two) times daily.   oxyCODONE 5 MG immediate release tablet Commonly known as: Oxy IR/ROXICODONE Take 1 tablet (5 mg total) by mouth every 6 (six) hours as needed for moderate pain.   polyethylene glycol 17 g packet Commonly known as: MIRALAX / GLYCOLAX Take 17 g by mouth daily. Start taking on: March 02, 2019   potassium chloride SA 20 MEQ tablet Commonly known as: KLOR-CON Take 20 mEq by mouth 3 (three) times daily.   Pulmicort Flexhaler 90 MCG/ACT inhaler Generic drug: Budesonide Inhale 2 puffs into the lungs 2 (two) times daily.    senna-docusate 8.6-50 MG tablet Commonly known as: Senokot-S Take 1 tablet by mouth at bedtime as needed for mild constipation.   silver sulfADIAZINE 1 % cream Commonly known as: Silvadene Apply 1 application topically daily.   simvastatin 40 MG tablet Commonly known as: ZOCOR Take 1 tablet (40 mg total) by mouth at bedtime.   spironolactone 25 MG tablet Commonly known as: ALDACTONE Take 1 tablet (25 mg total) by mouth daily. Start taking on: March 02, 2019        Signed: Chauncey Mann 03/01/2019, 10:45 AM

## 2019-03-03 ENCOUNTER — Telehealth: Payer: Self-pay | Admitting: *Deleted

## 2019-03-03 ENCOUNTER — Ambulatory Visit: Payer: Medicare Other | Admitting: Internal Medicine

## 2019-03-03 NOTE — Telephone Encounter (Signed)
Pt was omn TCM report admitted 02/02/19 for chronic right shin wound and chronic ambulatory dysfunction presents with worsening weakness along with new pain and rash around the right chin and ulcer with painful blisters at the bottom of the left foot likely cellulitis. Pt completed 10 days of IV antibiotics (Clindamycin then Vanc/Rocephin then transitioned to Cefepime). Patient also treated for gout flarer during admission. Completed 5 day course of Cipro for presumed UTI. Patient evaluated by PT and SNF placement recommended. Pt D/C 03/01/19 to SNF. Per summary will need to f/u w/PCP after leaving SNF.April KitchenJohny Chess

## 2019-03-16 DIAGNOSIS — E1140 Type 2 diabetes mellitus with diabetic neuropathy, unspecified: Secondary | ICD-10-CM | POA: Diagnosis not present

## 2019-03-16 DIAGNOSIS — N183 Chronic kidney disease, stage 3 unspecified: Secondary | ICD-10-CM | POA: Diagnosis not present

## 2019-03-16 DIAGNOSIS — L0390 Cellulitis, unspecified: Secondary | ICD-10-CM | POA: Diagnosis not present

## 2019-03-26 DIAGNOSIS — Z20828 Contact with and (suspected) exposure to other viral communicable diseases: Secondary | ICD-10-CM | POA: Diagnosis not present

## 2019-03-26 DIAGNOSIS — Z1383 Encounter for screening for respiratory disorder NEC: Secondary | ICD-10-CM | POA: Diagnosis not present

## 2019-03-27 DIAGNOSIS — M6281 Muscle weakness (generalized): Secondary | ICD-10-CM | POA: Diagnosis not present

## 2019-03-27 DIAGNOSIS — L03115 Cellulitis of right lower limb: Secondary | ICD-10-CM | POA: Diagnosis not present

## 2019-03-28 DIAGNOSIS — M6281 Muscle weakness (generalized): Secondary | ICD-10-CM | POA: Diagnosis not present

## 2019-03-28 DIAGNOSIS — L03115 Cellulitis of right lower limb: Secondary | ICD-10-CM | POA: Diagnosis not present

## 2019-03-29 DIAGNOSIS — I10 Essential (primary) hypertension: Secondary | ICD-10-CM | POA: Diagnosis not present

## 2019-03-29 DIAGNOSIS — M6281 Muscle weakness (generalized): Secondary | ICD-10-CM | POA: Diagnosis not present

## 2019-03-29 DIAGNOSIS — L03115 Cellulitis of right lower limb: Secondary | ICD-10-CM | POA: Diagnosis not present

## 2019-03-29 DIAGNOSIS — E1100 Type 2 diabetes mellitus with hyperosmolarity without nonketotic hyperglycemic-hyperosmolar coma (NKHHC): Secondary | ICD-10-CM | POA: Diagnosis not present

## 2019-03-31 ENCOUNTER — Ambulatory Visit: Payer: Medicare Other | Admitting: Podiatry

## 2019-04-03 DIAGNOSIS — L03115 Cellulitis of right lower limb: Secondary | ICD-10-CM | POA: Diagnosis not present

## 2019-04-03 DIAGNOSIS — M6281 Muscle weakness (generalized): Secondary | ICD-10-CM | POA: Diagnosis not present

## 2019-04-06 DIAGNOSIS — G8929 Other chronic pain: Secondary | ICD-10-CM | POA: Diagnosis not present

## 2019-04-06 DIAGNOSIS — L03115 Cellulitis of right lower limb: Secondary | ICD-10-CM | POA: Diagnosis not present

## 2019-04-06 DIAGNOSIS — M6281 Muscle weakness (generalized): Secondary | ICD-10-CM | POA: Diagnosis not present

## 2019-04-06 DIAGNOSIS — M25569 Pain in unspecified knee: Secondary | ICD-10-CM | POA: Diagnosis not present

## 2019-04-07 DIAGNOSIS — M6281 Muscle weakness (generalized): Secondary | ICD-10-CM | POA: Diagnosis not present

## 2019-04-07 DIAGNOSIS — L03115 Cellulitis of right lower limb: Secondary | ICD-10-CM | POA: Diagnosis not present

## 2019-04-09 DIAGNOSIS — Z20828 Contact with and (suspected) exposure to other viral communicable diseases: Secondary | ICD-10-CM | POA: Diagnosis not present

## 2019-04-11 DIAGNOSIS — L03115 Cellulitis of right lower limb: Secondary | ICD-10-CM | POA: Diagnosis not present

## 2019-04-11 DIAGNOSIS — M6281 Muscle weakness (generalized): Secondary | ICD-10-CM | POA: Diagnosis not present

## 2019-04-12 DIAGNOSIS — L03115 Cellulitis of right lower limb: Secondary | ICD-10-CM | POA: Diagnosis not present

## 2019-04-12 DIAGNOSIS — M6281 Muscle weakness (generalized): Secondary | ICD-10-CM | POA: Diagnosis not present

## 2019-04-13 DIAGNOSIS — J45909 Unspecified asthma, uncomplicated: Secondary | ICD-10-CM | POA: Diagnosis not present

## 2019-04-13 DIAGNOSIS — N183 Chronic kidney disease, stage 3 unspecified: Secondary | ICD-10-CM | POA: Diagnosis not present

## 2019-04-13 DIAGNOSIS — E1140 Type 2 diabetes mellitus with diabetic neuropathy, unspecified: Secondary | ICD-10-CM | POA: Diagnosis not present

## 2019-04-13 DIAGNOSIS — F39 Unspecified mood [affective] disorder: Secondary | ICD-10-CM | POA: Diagnosis not present

## 2019-04-13 DIAGNOSIS — G8929 Other chronic pain: Secondary | ICD-10-CM | POA: Diagnosis not present

## 2019-04-13 DIAGNOSIS — M6281 Muscle weakness (generalized): Secondary | ICD-10-CM | POA: Diagnosis not present

## 2019-04-13 DIAGNOSIS — M25569 Pain in unspecified knee: Secondary | ICD-10-CM | POA: Diagnosis not present

## 2019-04-13 DIAGNOSIS — L03115 Cellulitis of right lower limb: Secondary | ICD-10-CM | POA: Diagnosis not present

## 2019-04-14 DIAGNOSIS — J449 Chronic obstructive pulmonary disease, unspecified: Secondary | ICD-10-CM | POA: Diagnosis not present

## 2019-04-14 DIAGNOSIS — L03115 Cellulitis of right lower limb: Secondary | ICD-10-CM | POA: Diagnosis not present

## 2019-04-14 DIAGNOSIS — L97919 Non-pressure chronic ulcer of unspecified part of right lower leg with unspecified severity: Secondary | ICD-10-CM | POA: Diagnosis not present

## 2019-04-14 DIAGNOSIS — M109 Gout, unspecified: Secondary | ICD-10-CM | POA: Diagnosis not present

## 2019-04-14 DIAGNOSIS — M6281 Muscle weakness (generalized): Secondary | ICD-10-CM | POA: Diagnosis not present

## 2019-04-15 DIAGNOSIS — L03115 Cellulitis of right lower limb: Secondary | ICD-10-CM | POA: Diagnosis not present

## 2019-04-15 DIAGNOSIS — M6281 Muscle weakness (generalized): Secondary | ICD-10-CM | POA: Diagnosis not present

## 2019-04-17 DIAGNOSIS — M6281 Muscle weakness (generalized): Secondary | ICD-10-CM | POA: Diagnosis not present

## 2019-04-17 DIAGNOSIS — L03115 Cellulitis of right lower limb: Secondary | ICD-10-CM | POA: Diagnosis not present

## 2019-04-18 DIAGNOSIS — M6281 Muscle weakness (generalized): Secondary | ICD-10-CM | POA: Diagnosis not present

## 2019-04-18 DIAGNOSIS — L03115 Cellulitis of right lower limb: Secondary | ICD-10-CM | POA: Diagnosis not present

## 2019-04-19 DIAGNOSIS — Z1383 Encounter for screening for respiratory disorder NEC: Secondary | ICD-10-CM | POA: Diagnosis not present

## 2019-04-19 DIAGNOSIS — L03115 Cellulitis of right lower limb: Secondary | ICD-10-CM | POA: Diagnosis not present

## 2019-04-19 DIAGNOSIS — M6281 Muscle weakness (generalized): Secondary | ICD-10-CM | POA: Diagnosis not present

## 2019-04-19 DIAGNOSIS — Z20828 Contact with and (suspected) exposure to other viral communicable diseases: Secondary | ICD-10-CM | POA: Diagnosis not present

## 2019-04-20 DIAGNOSIS — M6281 Muscle weakness (generalized): Secondary | ICD-10-CM | POA: Diagnosis not present

## 2019-04-20 DIAGNOSIS — L03115 Cellulitis of right lower limb: Secondary | ICD-10-CM | POA: Diagnosis not present

## 2019-04-22 DIAGNOSIS — M6281 Muscle weakness (generalized): Secondary | ICD-10-CM | POA: Diagnosis not present

## 2019-04-22 DIAGNOSIS — L03115 Cellulitis of right lower limb: Secondary | ICD-10-CM | POA: Diagnosis not present

## 2019-04-24 DIAGNOSIS — L03115 Cellulitis of right lower limb: Secondary | ICD-10-CM | POA: Diagnosis not present

## 2019-04-24 DIAGNOSIS — M6281 Muscle weakness (generalized): Secondary | ICD-10-CM | POA: Diagnosis not present

## 2019-04-25 DIAGNOSIS — M6281 Muscle weakness (generalized): Secondary | ICD-10-CM | POA: Diagnosis not present

## 2019-04-25 DIAGNOSIS — L03115 Cellulitis of right lower limb: Secondary | ICD-10-CM | POA: Diagnosis not present

## 2019-04-26 DIAGNOSIS — L03115 Cellulitis of right lower limb: Secondary | ICD-10-CM | POA: Diagnosis not present

## 2019-04-26 DIAGNOSIS — M6281 Muscle weakness (generalized): Secondary | ICD-10-CM | POA: Diagnosis not present

## 2019-04-27 DIAGNOSIS — L03115 Cellulitis of right lower limb: Secondary | ICD-10-CM | POA: Diagnosis not present

## 2019-04-27 DIAGNOSIS — R21 Rash and other nonspecific skin eruption: Secondary | ICD-10-CM | POA: Diagnosis not present

## 2019-04-27 DIAGNOSIS — M6281 Muscle weakness (generalized): Secondary | ICD-10-CM | POA: Diagnosis not present

## 2019-04-28 DIAGNOSIS — M6281 Muscle weakness (generalized): Secondary | ICD-10-CM | POA: Diagnosis not present

## 2019-04-28 DIAGNOSIS — I10 Essential (primary) hypertension: Secondary | ICD-10-CM | POA: Diagnosis not present

## 2019-04-28 DIAGNOSIS — N390 Urinary tract infection, site not specified: Secondary | ICD-10-CM | POA: Diagnosis not present

## 2019-04-28 DIAGNOSIS — E1100 Type 2 diabetes mellitus with hyperosmolarity without nonketotic hyperglycemic-hyperosmolar coma (NKHHC): Secondary | ICD-10-CM | POA: Diagnosis not present

## 2019-04-28 DIAGNOSIS — L03115 Cellulitis of right lower limb: Secondary | ICD-10-CM | POA: Diagnosis not present

## 2019-05-01 DIAGNOSIS — M6281 Muscle weakness (generalized): Secondary | ICD-10-CM | POA: Diagnosis not present

## 2019-05-01 DIAGNOSIS — L03115 Cellulitis of right lower limb: Secondary | ICD-10-CM | POA: Diagnosis not present

## 2019-05-02 DIAGNOSIS — M6281 Muscle weakness (generalized): Secondary | ICD-10-CM | POA: Diagnosis not present

## 2019-05-02 DIAGNOSIS — L03115 Cellulitis of right lower limb: Secondary | ICD-10-CM | POA: Diagnosis not present

## 2019-05-03 DIAGNOSIS — M6281 Muscle weakness (generalized): Secondary | ICD-10-CM | POA: Diagnosis not present

## 2019-05-03 DIAGNOSIS — L03115 Cellulitis of right lower limb: Secondary | ICD-10-CM | POA: Diagnosis not present

## 2019-05-04 DIAGNOSIS — L03115 Cellulitis of right lower limb: Secondary | ICD-10-CM | POA: Diagnosis not present

## 2019-05-04 DIAGNOSIS — M6281 Muscle weakness (generalized): Secondary | ICD-10-CM | POA: Diagnosis not present

## 2019-05-04 DIAGNOSIS — E785 Hyperlipidemia, unspecified: Secondary | ICD-10-CM | POA: Diagnosis not present

## 2019-05-04 DIAGNOSIS — R2681 Unsteadiness on feet: Secondary | ICD-10-CM | POA: Diagnosis not present

## 2019-05-05 DIAGNOSIS — E785 Hyperlipidemia, unspecified: Secondary | ICD-10-CM | POA: Diagnosis not present

## 2019-05-05 DIAGNOSIS — R2681 Unsteadiness on feet: Secondary | ICD-10-CM | POA: Diagnosis not present

## 2019-05-05 DIAGNOSIS — L03115 Cellulitis of right lower limb: Secondary | ICD-10-CM | POA: Diagnosis not present

## 2019-05-05 DIAGNOSIS — M6281 Muscle weakness (generalized): Secondary | ICD-10-CM | POA: Diagnosis not present

## 2019-05-08 DIAGNOSIS — M6281 Muscle weakness (generalized): Secondary | ICD-10-CM | POA: Diagnosis not present

## 2019-05-08 DIAGNOSIS — E785 Hyperlipidemia, unspecified: Secondary | ICD-10-CM | POA: Diagnosis not present

## 2019-05-08 DIAGNOSIS — L03115 Cellulitis of right lower limb: Secondary | ICD-10-CM | POA: Diagnosis not present

## 2019-05-08 DIAGNOSIS — R2681 Unsteadiness on feet: Secondary | ICD-10-CM | POA: Diagnosis not present

## 2019-05-09 DIAGNOSIS — R2681 Unsteadiness on feet: Secondary | ICD-10-CM | POA: Diagnosis not present

## 2019-05-09 DIAGNOSIS — L03115 Cellulitis of right lower limb: Secondary | ICD-10-CM | POA: Diagnosis not present

## 2019-05-09 DIAGNOSIS — M6281 Muscle weakness (generalized): Secondary | ICD-10-CM | POA: Diagnosis not present

## 2019-05-09 DIAGNOSIS — E785 Hyperlipidemia, unspecified: Secondary | ICD-10-CM | POA: Diagnosis not present

## 2019-05-10 DIAGNOSIS — M6281 Muscle weakness (generalized): Secondary | ICD-10-CM | POA: Diagnosis not present

## 2019-05-10 DIAGNOSIS — E785 Hyperlipidemia, unspecified: Secondary | ICD-10-CM | POA: Diagnosis not present

## 2019-05-10 DIAGNOSIS — R2681 Unsteadiness on feet: Secondary | ICD-10-CM | POA: Diagnosis not present

## 2019-05-10 DIAGNOSIS — L03115 Cellulitis of right lower limb: Secondary | ICD-10-CM | POA: Diagnosis not present

## 2019-05-11 DIAGNOSIS — L03115 Cellulitis of right lower limb: Secondary | ICD-10-CM | POA: Diagnosis not present

## 2019-05-11 DIAGNOSIS — M6281 Muscle weakness (generalized): Secondary | ICD-10-CM | POA: Diagnosis not present

## 2019-05-11 DIAGNOSIS — R2681 Unsteadiness on feet: Secondary | ICD-10-CM | POA: Diagnosis not present

## 2019-05-11 DIAGNOSIS — E785 Hyperlipidemia, unspecified: Secondary | ICD-10-CM | POA: Diagnosis not present

## 2019-05-12 DIAGNOSIS — M6281 Muscle weakness (generalized): Secondary | ICD-10-CM | POA: Diagnosis not present

## 2019-05-12 DIAGNOSIS — E785 Hyperlipidemia, unspecified: Secondary | ICD-10-CM | POA: Diagnosis not present

## 2019-05-12 DIAGNOSIS — L03115 Cellulitis of right lower limb: Secondary | ICD-10-CM | POA: Diagnosis not present

## 2019-05-12 DIAGNOSIS — R2681 Unsteadiness on feet: Secondary | ICD-10-CM | POA: Diagnosis not present

## 2019-05-14 DIAGNOSIS — M1711 Unilateral primary osteoarthritis, right knee: Secondary | ICD-10-CM | POA: Diagnosis not present

## 2019-05-14 DIAGNOSIS — R0602 Shortness of breath: Secondary | ICD-10-CM | POA: Diagnosis not present

## 2019-05-14 DIAGNOSIS — E785 Hyperlipidemia, unspecified: Secondary | ICD-10-CM | POA: Diagnosis not present

## 2019-05-14 DIAGNOSIS — R2681 Unsteadiness on feet: Secondary | ICD-10-CM | POA: Diagnosis not present

## 2019-05-14 DIAGNOSIS — L03115 Cellulitis of right lower limb: Secondary | ICD-10-CM | POA: Diagnosis not present

## 2019-05-14 DIAGNOSIS — M25561 Pain in right knee: Secondary | ICD-10-CM | POA: Diagnosis not present

## 2019-05-14 DIAGNOSIS — M6281 Muscle weakness (generalized): Secondary | ICD-10-CM | POA: Diagnosis not present

## 2019-05-15 DIAGNOSIS — Z79899 Other long term (current) drug therapy: Secondary | ICD-10-CM | POA: Diagnosis not present

## 2019-05-15 DIAGNOSIS — M6281 Muscle weakness (generalized): Secondary | ICD-10-CM | POA: Diagnosis not present

## 2019-05-15 DIAGNOSIS — J449 Chronic obstructive pulmonary disease, unspecified: Secondary | ICD-10-CM | POA: Diagnosis not present

## 2019-05-15 DIAGNOSIS — L97919 Non-pressure chronic ulcer of unspecified part of right lower leg with unspecified severity: Secondary | ICD-10-CM | POA: Diagnosis not present

## 2019-05-15 DIAGNOSIS — M109 Gout, unspecified: Secondary | ICD-10-CM | POA: Diagnosis not present

## 2019-05-16 DIAGNOSIS — L03115 Cellulitis of right lower limb: Secondary | ICD-10-CM | POA: Diagnosis not present

## 2019-05-16 DIAGNOSIS — R2681 Unsteadiness on feet: Secondary | ICD-10-CM | POA: Diagnosis not present

## 2019-05-16 DIAGNOSIS — E785 Hyperlipidemia, unspecified: Secondary | ICD-10-CM | POA: Diagnosis not present

## 2019-05-16 DIAGNOSIS — M6281 Muscle weakness (generalized): Secondary | ICD-10-CM | POA: Diagnosis not present

## 2019-05-17 DIAGNOSIS — M6281 Muscle weakness (generalized): Secondary | ICD-10-CM | POA: Diagnosis not present

## 2019-05-17 DIAGNOSIS — R2681 Unsteadiness on feet: Secondary | ICD-10-CM | POA: Diagnosis not present

## 2019-05-17 DIAGNOSIS — E785 Hyperlipidemia, unspecified: Secondary | ICD-10-CM | POA: Diagnosis not present

## 2019-05-17 DIAGNOSIS — L03115 Cellulitis of right lower limb: Secondary | ICD-10-CM | POA: Diagnosis not present

## 2019-05-18 DIAGNOSIS — M6281 Muscle weakness (generalized): Secondary | ICD-10-CM | POA: Diagnosis not present

## 2019-05-18 DIAGNOSIS — L03115 Cellulitis of right lower limb: Secondary | ICD-10-CM | POA: Diagnosis not present

## 2019-05-18 DIAGNOSIS — R2681 Unsteadiness on feet: Secondary | ICD-10-CM | POA: Diagnosis not present

## 2019-05-18 DIAGNOSIS — M109 Gout, unspecified: Secondary | ICD-10-CM | POA: Diagnosis not present

## 2019-05-18 DIAGNOSIS — E785 Hyperlipidemia, unspecified: Secondary | ICD-10-CM | POA: Diagnosis not present

## 2019-05-18 DIAGNOSIS — M25569 Pain in unspecified knee: Secondary | ICD-10-CM | POA: Diagnosis not present

## 2019-05-18 DIAGNOSIS — F39 Unspecified mood [affective] disorder: Secondary | ICD-10-CM | POA: Diagnosis not present

## 2019-05-19 DIAGNOSIS — E785 Hyperlipidemia, unspecified: Secondary | ICD-10-CM | POA: Diagnosis not present

## 2019-05-19 DIAGNOSIS — R2681 Unsteadiness on feet: Secondary | ICD-10-CM | POA: Diagnosis not present

## 2019-05-19 DIAGNOSIS — L03115 Cellulitis of right lower limb: Secondary | ICD-10-CM | POA: Diagnosis not present

## 2019-05-19 DIAGNOSIS — M6281 Muscle weakness (generalized): Secondary | ICD-10-CM | POA: Diagnosis not present

## 2019-05-22 DIAGNOSIS — M6281 Muscle weakness (generalized): Secondary | ICD-10-CM | POA: Diagnosis not present

## 2019-05-22 DIAGNOSIS — L03115 Cellulitis of right lower limb: Secondary | ICD-10-CM | POA: Diagnosis not present

## 2019-05-22 DIAGNOSIS — R2681 Unsteadiness on feet: Secondary | ICD-10-CM | POA: Diagnosis not present

## 2019-05-22 DIAGNOSIS — E785 Hyperlipidemia, unspecified: Secondary | ICD-10-CM | POA: Diagnosis not present

## 2019-05-23 DIAGNOSIS — R2681 Unsteadiness on feet: Secondary | ICD-10-CM | POA: Diagnosis not present

## 2019-05-23 DIAGNOSIS — E785 Hyperlipidemia, unspecified: Secondary | ICD-10-CM | POA: Diagnosis not present

## 2019-05-23 DIAGNOSIS — M6281 Muscle weakness (generalized): Secondary | ICD-10-CM | POA: Diagnosis not present

## 2019-05-23 DIAGNOSIS — L03115 Cellulitis of right lower limb: Secondary | ICD-10-CM | POA: Diagnosis not present

## 2019-05-24 DIAGNOSIS — R2681 Unsteadiness on feet: Secondary | ICD-10-CM | POA: Diagnosis not present

## 2019-05-24 DIAGNOSIS — M6281 Muscle weakness (generalized): Secondary | ICD-10-CM | POA: Diagnosis not present

## 2019-05-24 DIAGNOSIS — E785 Hyperlipidemia, unspecified: Secondary | ICD-10-CM | POA: Diagnosis not present

## 2019-05-24 DIAGNOSIS — L03115 Cellulitis of right lower limb: Secondary | ICD-10-CM | POA: Diagnosis not present

## 2019-05-25 DIAGNOSIS — L03115 Cellulitis of right lower limb: Secondary | ICD-10-CM | POA: Diagnosis not present

## 2019-05-25 DIAGNOSIS — E785 Hyperlipidemia, unspecified: Secondary | ICD-10-CM | POA: Diagnosis not present

## 2019-05-25 DIAGNOSIS — M6281 Muscle weakness (generalized): Secondary | ICD-10-CM | POA: Diagnosis not present

## 2019-05-25 DIAGNOSIS — R2681 Unsteadiness on feet: Secondary | ICD-10-CM | POA: Diagnosis not present

## 2019-05-26 ENCOUNTER — Telehealth: Payer: Self-pay | Admitting: Internal Medicine

## 2019-05-26 DIAGNOSIS — M6281 Muscle weakness (generalized): Secondary | ICD-10-CM | POA: Diagnosis not present

## 2019-05-26 DIAGNOSIS — R2681 Unsteadiness on feet: Secondary | ICD-10-CM | POA: Diagnosis not present

## 2019-05-26 DIAGNOSIS — E785 Hyperlipidemia, unspecified: Secondary | ICD-10-CM | POA: Diagnosis not present

## 2019-05-26 DIAGNOSIS — L03115 Cellulitis of right lower limb: Secondary | ICD-10-CM | POA: Diagnosis not present

## 2019-05-26 NOTE — Telephone Encounter (Signed)
Called pt and let her know she will need to contact medical records for this information. Pt expressed understanding

## 2019-05-26 NOTE — Telephone Encounter (Signed)
New Message:  Pt is calling and is requesting medical records for the dates of 11/3-11/7/20 and 02/02/19-03/01/19.She states she would like 4 copies of each one. Please advise.   She states it has to come from the doctors office and she would like them mailed to her.

## 2019-05-29 DIAGNOSIS — M170 Bilateral primary osteoarthritis of knee: Secondary | ICD-10-CM | POA: Diagnosis not present

## 2019-05-29 DIAGNOSIS — M6281 Muscle weakness (generalized): Secondary | ICD-10-CM | POA: Diagnosis not present

## 2019-05-29 DIAGNOSIS — M179 Osteoarthritis of knee, unspecified: Secondary | ICD-10-CM | POA: Diagnosis not present

## 2019-05-29 DIAGNOSIS — R2681 Unsteadiness on feet: Secondary | ICD-10-CM | POA: Diagnosis not present

## 2019-05-29 DIAGNOSIS — Z7401 Bed confinement status: Secondary | ICD-10-CM | POA: Diagnosis not present

## 2019-05-29 DIAGNOSIS — M1712 Unilateral primary osteoarthritis, left knee: Secondary | ICD-10-CM | POA: Diagnosis not present

## 2019-05-29 DIAGNOSIS — R0902 Hypoxemia: Secondary | ICD-10-CM | POA: Diagnosis not present

## 2019-05-29 DIAGNOSIS — R5381 Other malaise: Secondary | ICD-10-CM | POA: Diagnosis not present

## 2019-05-29 DIAGNOSIS — M1711 Unilateral primary osteoarthritis, right knee: Secondary | ICD-10-CM | POA: Diagnosis not present

## 2019-05-29 DIAGNOSIS — L03115 Cellulitis of right lower limb: Secondary | ICD-10-CM | POA: Diagnosis not present

## 2019-05-29 DIAGNOSIS — M2550 Pain in unspecified joint: Secondary | ICD-10-CM | POA: Diagnosis not present

## 2019-05-29 DIAGNOSIS — E785 Hyperlipidemia, unspecified: Secondary | ICD-10-CM | POA: Diagnosis not present

## 2019-05-30 DIAGNOSIS — M6281 Muscle weakness (generalized): Secondary | ICD-10-CM | POA: Diagnosis not present

## 2019-05-30 DIAGNOSIS — E785 Hyperlipidemia, unspecified: Secondary | ICD-10-CM | POA: Diagnosis not present

## 2019-05-30 DIAGNOSIS — L03115 Cellulitis of right lower limb: Secondary | ICD-10-CM | POA: Diagnosis not present

## 2019-05-30 DIAGNOSIS — R2681 Unsteadiness on feet: Secondary | ICD-10-CM | POA: Diagnosis not present

## 2019-05-31 DIAGNOSIS — R2681 Unsteadiness on feet: Secondary | ICD-10-CM | POA: Diagnosis not present

## 2019-05-31 DIAGNOSIS — E785 Hyperlipidemia, unspecified: Secondary | ICD-10-CM | POA: Diagnosis not present

## 2019-05-31 DIAGNOSIS — L03115 Cellulitis of right lower limb: Secondary | ICD-10-CM | POA: Diagnosis not present

## 2019-05-31 DIAGNOSIS — M6281 Muscle weakness (generalized): Secondary | ICD-10-CM | POA: Diagnosis not present

## 2019-06-01 DIAGNOSIS — L03115 Cellulitis of right lower limb: Secondary | ICD-10-CM | POA: Diagnosis not present

## 2019-06-01 DIAGNOSIS — M6281 Muscle weakness (generalized): Secondary | ICD-10-CM | POA: Diagnosis not present

## 2019-06-01 DIAGNOSIS — R2681 Unsteadiness on feet: Secondary | ICD-10-CM | POA: Diagnosis not present

## 2019-06-01 DIAGNOSIS — E785 Hyperlipidemia, unspecified: Secondary | ICD-10-CM | POA: Diagnosis not present

## 2019-06-02 DIAGNOSIS — E785 Hyperlipidemia, unspecified: Secondary | ICD-10-CM | POA: Diagnosis not present

## 2019-06-02 DIAGNOSIS — R2681 Unsteadiness on feet: Secondary | ICD-10-CM | POA: Diagnosis not present

## 2019-06-02 DIAGNOSIS — M6281 Muscle weakness (generalized): Secondary | ICD-10-CM | POA: Diagnosis not present

## 2019-06-02 DIAGNOSIS — L03115 Cellulitis of right lower limb: Secondary | ICD-10-CM | POA: Diagnosis not present

## 2019-06-05 DIAGNOSIS — R2681 Unsteadiness on feet: Secondary | ICD-10-CM | POA: Diagnosis not present

## 2019-06-05 DIAGNOSIS — M6281 Muscle weakness (generalized): Secondary | ICD-10-CM | POA: Diagnosis not present

## 2019-06-05 DIAGNOSIS — E785 Hyperlipidemia, unspecified: Secondary | ICD-10-CM | POA: Diagnosis not present

## 2019-06-05 DIAGNOSIS — L03115 Cellulitis of right lower limb: Secondary | ICD-10-CM | POA: Diagnosis not present

## 2019-06-06 DIAGNOSIS — M6281 Muscle weakness (generalized): Secondary | ICD-10-CM | POA: Diagnosis not present

## 2019-06-06 DIAGNOSIS — L03115 Cellulitis of right lower limb: Secondary | ICD-10-CM | POA: Diagnosis not present

## 2019-06-06 DIAGNOSIS — E785 Hyperlipidemia, unspecified: Secondary | ICD-10-CM | POA: Diagnosis not present

## 2019-06-06 DIAGNOSIS — R2681 Unsteadiness on feet: Secondary | ICD-10-CM | POA: Diagnosis not present

## 2019-06-07 DIAGNOSIS — E785 Hyperlipidemia, unspecified: Secondary | ICD-10-CM | POA: Diagnosis not present

## 2019-06-07 DIAGNOSIS — M6281 Muscle weakness (generalized): Secondary | ICD-10-CM | POA: Diagnosis not present

## 2019-06-07 DIAGNOSIS — L03115 Cellulitis of right lower limb: Secondary | ICD-10-CM | POA: Diagnosis not present

## 2019-06-07 DIAGNOSIS — R2681 Unsteadiness on feet: Secondary | ICD-10-CM | POA: Diagnosis not present

## 2019-06-08 DIAGNOSIS — L03115 Cellulitis of right lower limb: Secondary | ICD-10-CM | POA: Diagnosis not present

## 2019-06-08 DIAGNOSIS — M6281 Muscle weakness (generalized): Secondary | ICD-10-CM | POA: Diagnosis not present

## 2019-06-08 DIAGNOSIS — R2681 Unsteadiness on feet: Secondary | ICD-10-CM | POA: Diagnosis not present

## 2019-06-08 DIAGNOSIS — E785 Hyperlipidemia, unspecified: Secondary | ICD-10-CM | POA: Diagnosis not present

## 2019-06-09 DIAGNOSIS — R2681 Unsteadiness on feet: Secondary | ICD-10-CM | POA: Diagnosis not present

## 2019-06-09 DIAGNOSIS — E785 Hyperlipidemia, unspecified: Secondary | ICD-10-CM | POA: Diagnosis not present

## 2019-06-09 DIAGNOSIS — L03115 Cellulitis of right lower limb: Secondary | ICD-10-CM | POA: Diagnosis not present

## 2019-06-09 DIAGNOSIS — M6281 Muscle weakness (generalized): Secondary | ICD-10-CM | POA: Diagnosis not present

## 2019-06-12 DIAGNOSIS — L03115 Cellulitis of right lower limb: Secondary | ICD-10-CM | POA: Diagnosis not present

## 2019-06-12 DIAGNOSIS — R2681 Unsteadiness on feet: Secondary | ICD-10-CM | POA: Diagnosis not present

## 2019-06-12 DIAGNOSIS — M6281 Muscle weakness (generalized): Secondary | ICD-10-CM | POA: Diagnosis not present

## 2019-06-12 DIAGNOSIS — E785 Hyperlipidemia, unspecified: Secondary | ICD-10-CM | POA: Diagnosis not present

## 2019-06-13 DIAGNOSIS — R2681 Unsteadiness on feet: Secondary | ICD-10-CM | POA: Diagnosis not present

## 2019-06-13 DIAGNOSIS — M6281 Muscle weakness (generalized): Secondary | ICD-10-CM | POA: Diagnosis not present

## 2019-06-13 DIAGNOSIS — L03115 Cellulitis of right lower limb: Secondary | ICD-10-CM | POA: Diagnosis not present

## 2019-06-13 DIAGNOSIS — E785 Hyperlipidemia, unspecified: Secondary | ICD-10-CM | POA: Diagnosis not present

## 2019-06-14 DIAGNOSIS — L03115 Cellulitis of right lower limb: Secondary | ICD-10-CM | POA: Diagnosis not present

## 2019-06-14 DIAGNOSIS — E785 Hyperlipidemia, unspecified: Secondary | ICD-10-CM | POA: Diagnosis not present

## 2019-06-14 DIAGNOSIS — M109 Gout, unspecified: Secondary | ICD-10-CM | POA: Diagnosis not present

## 2019-06-14 DIAGNOSIS — R2681 Unsteadiness on feet: Secondary | ICD-10-CM | POA: Diagnosis not present

## 2019-06-14 DIAGNOSIS — L97919 Non-pressure chronic ulcer of unspecified part of right lower leg with unspecified severity: Secondary | ICD-10-CM | POA: Diagnosis not present

## 2019-06-14 DIAGNOSIS — M6281 Muscle weakness (generalized): Secondary | ICD-10-CM | POA: Diagnosis not present

## 2019-06-14 DIAGNOSIS — J449 Chronic obstructive pulmonary disease, unspecified: Secondary | ICD-10-CM | POA: Diagnosis not present

## 2019-06-16 ENCOUNTER — Ambulatory Visit: Payer: Medicare PPO | Admitting: Podiatry

## 2019-06-19 ENCOUNTER — Ambulatory Visit (INDEPENDENT_AMBULATORY_CARE_PROVIDER_SITE_OTHER): Payer: Medicare PPO | Admitting: Physician Assistant

## 2019-06-19 ENCOUNTER — Other Ambulatory Visit: Payer: Self-pay

## 2019-06-19 VITALS — Ht 64.0 in | Wt 340.0 lb

## 2019-06-19 DIAGNOSIS — E1142 Type 2 diabetes mellitus with diabetic polyneuropathy: Secondary | ICD-10-CM | POA: Diagnosis not present

## 2019-06-19 NOTE — Progress Notes (Signed)
Office Visit Note   Patient: April [name]           Date of Birth: Sep 24, 1950           MRN: [id number] Visit Date: 06/19/2019              Requested by: [name], MD [place],  [place] [id number] PCP: [name], MD  Chief Complaint  Patient presents with  . Right Leg - Pain, Edema    She states that there is burning, throbbing, and aching in the right leg with swelling.  . Other    Nail Trim      HPI: Patient is follow-up today for her right leg and requesting a nail trim.  She is a little over a year status post debridement of her right ankle with skin grafting.  She has been at a nursing home has had several hospitalizations and has been lost to follow-up.  Overall she is doing well however she has significant pain just below the area of the skin grafting  Assessment & Plan: Visit Diagnoses: No diagnosis found.  Plan: We recommend Ace wrapping daily or a compression crew sock.  Follow-up in 1 month.  Nails trimmed to a healthy surface 1 through 10  Follow-Up Instructions: No follow-ups on file.   Ortho Exam  Patient is alert, oriented, no adenopathy, well-dressed, normal affect, normal respiratory effort. Focused examination of her right foot demonstrates ankle skin graft has completely incorporated no drainage no foul odor.  Just distal to this he had above the foot she has some lymphedema and swelling.  No swelling extends into her foot or into her proximal calf.  No cellulitis is noted  Imaging: No results found. No images are attached to the encounter.  Labs: Lab Results  Component Value Date   HGBA1C 6.0 (H) 09/08/2018   HGBA1C 6.4 07/15/2018   HGBA1C 6.1 (H) 05/27/2018   ESRSEDRATE 42 (H) 08/12/2018   ESRSEDRATE 56 (H) 03/09/2018   ESRSEDRATE 13 06/02/2016   CRP 1.6 (H) 02/04/2019   CRP 1.5 (H) 12/10/2018   CRP 2.1 (H) 12/09/2018   LABURIC 11.1 (H) 02/04/2019   LABURIC 10.5 (H) 09/13/2018   LABURIC 11.0 (H) 09/11/2018   REPTSTATUS 02/17/2019 FINAL 02/15/2019   GRAMSTAIN  05/27/2018    FEW WBC PRESENT, PREDOMINANTLY PMN MODERATE GRAM POSITIVE RODS RARE GRAM POSITIVE COCCI IN PAIRS    CULT (A) 02/15/2019    80,000 COLONIES/mL CITROBACTER FREUNDII 80,000 COLONIES/mL PSEUDOMONAS AERUGINOSA    LABORGA CITROBACTER FREUNDII (A) 02/15/2019   LABORGA PSEUDOMONAS AERUGINOSA (A) 02/15/2019     Lab Results  Component Value Date   ALBUMIN 3.2 (L) 02/23/2019   ALBUMIN 3.0 (L) 12/10/2018   ALBUMIN 3.1 (L) 12/09/2018   LABURIC 11.1 (H) 02/04/2019   LABURIC 10.5 (H) 09/13/2018   LABURIC 11.0 (H) 09/11/2018    Lab Results  Component Value Date   MG 2.2 02/15/2019   MG 2.2 02/14/2019   MG 2.2 02/13/2019   Lab Results  Component Value Date   VD25OH 47.2 06/02/2016   VD25OH 42 01/23/2011    No results found for: PREALBUMIN CBC EXTENDED Latest Ref Rng & Units 02/27/2019 02/23/2019 02/15/2019  WBC 4.0 - 10.5 K/uL 4.5 5.9 -  RBC 3.87 - 5.11 MIL/uL 3.88 4.05 -  HGB 12.0 - 15.0 g/dL 11.7(L) 12.1 11.7(L)  HCT 36.0 - 46.0 % 36.5 38.7 37.0  PLT 150 - 400 K/uL 160 196 -  NEUTROABS  1.7 - 7.7 K/uL 2.3 3.2 -  LYMPHSABS 0.7 - 4.0 K/uL 1.3 1.9 -     Body mass index is 58.36 kg/m.  Orders:  No orders of the defined types were placed in this encounter.  No orders of the defined types were placed in this encounter.    Procedures: No procedures performed  Clinical Data: No additional findings.  ROS:  All other systems negative, except as noted in the HPI. Review of Systems  Objective: Vital Signs: Ht 5\' 4"  (1.626 m)   Wt (!) 340 lb (154.2 kg)   BMI 58.36 kg/m   Specialty Comments:  No specialty comments available.  PMFS History: Patient Active Problem List   Diagnosis Date Noted  . SOB (shortness of breath)   . Acute idiopathic gout of right foot   . Morbid obesity ([place])   . Non-pressure chronic ulcer of left heel and midfoot limited to breakdown of skin ([place])   . Idiopathic chronic gout  of multiple sites without tophus   . Cellulitis 02/02/2019  . Pneumonia due to COVID-19 virus 12/10/2018  . COVID-19 virus infection 12/06/2018  . Chronic venous hypertension (idiopathic) with ulcer of right lower extremity (CODE) ([place]) 11/09/2018  . UTI (urinary tract infection) 11/09/2018  . Right hip pain 11/09/2018  . Achilles tendon sprain, left, initial encounter   . Osteomyelitis of ankle or foot, acute, left ([place]) 09/07/2018  . Osteomyelitis of ankle or foot, left, acute ([place]) 09/07/2018  . Acute on chronic kidney failure ([place]) 08/15/2018  . Onychomycosis 05/19/2018  . Idiopathic chronic venous hypertension of right lower extremity with ulcer and inflammation ([place]) 05/19/2018  . Traumatic wound dehiscence   . Skin lumps 01/14/2018  . Bilateral leg cramps 12/14/2017  . Left shoulder pain 08/14/2016  . Meralgia paresthetica 07/16/2016  . Chronic left-sided low back pain with left-sided sciatica 06/02/2016  . AKI (acute kidney injury) ([place]) 03/28/2016  . Whole body pain 03/20/2016  . Neuropathy 03/20/2016  . Diabetic peripheral neuropathy ([place]) 03/20/2016  . Osteopenia 01/11/2016  . CKD (chronic kidney disease) stage 3, GFR 30-59 ml/min (HCC) 01/02/2016  . Hand paresthesia 07/18/2015  . Carpal tunnel syndrome 06/07/2015  . Family history of colon cancer   . Diabetes mellitus with neurological manifestations ([place]) 04/18/2015  . Bilateral leg edema 04/11/2015  . Abnormality of gait 01/03/2015  . Hereditary and idiopathic peripheral neuropathy 01/03/2015  . GERD (gastroesophageal reflux disease) 06/17/2014  . Hx of colonic polyps 12/15/2012  . Vitamin B12 deficiency 11/03/2012  . Intrinsic asthma 03/23/2012  . Chronic diastolic heart failure ([place]) 02/20/2011  . OSA (obstructive sleep apnea) 09/17/2010  . URINARY URGENCY 01/08/2010  . CAD, NATIVE VESSEL 11/20/2008  . Osteoarthritis of knees, bilateral 06/14/2008  . Hyperlipidemia 05/10/2007  . Essential hypertension  01/18/2007  . Hypokalemia 04/30/2006  . Morbid (severe) obesity due to excess calories ([place]) 04/30/2006  . HX, PERSONAL, PEPTIC ULCER DISEASE 04/30/2006   Past Medical History:  Diagnosis Date  . Anemia   . Asthma   . CAD (coronary artery disease)   . Carpal tunnel syndrome   . Cellulitis of both lower extremities 04/11/2015  . CHF (congestive heart failure) ([place])   . Chronic kidney disease    [facility]- followed by [facility]  . Colon polyp, hyperplastic 2007 & 2012  . Complication of anesthesia 1999   svt with renal calculi surgery, no problems since  . CTS (carpal tunnel syndrome)    bilateral  . Diabetes mellitus   .  Eczema   . GERD (gastroesophageal reflux disease)   . History of kidney stones 1999  . Hyperlipidemia   . Hypertension   . Leg ulcer ([place]) 04/24/2015   Right lateral leg No evidence of an infection Monitor closely Keep edema controlled   . Leg ulcer ([place])    right lower  . Meralgia paresthetica    Dr. [name]  . Morbid obesity ([place])   . Morbid obesity ([place])   . Neuropathy    toes and legs  . Osteoarthrosis, unspecified whether generalized or localized, lower leg    knee  . PUD (peptic ulcer disease)   . Shortness of breath dyspnea    with exertion  . Sleep apnea    per progress note 02/25/2018  . Type II or unspecified type diabetes mellitus without mention of complication, not stated as uncontrolled   . Unspecified hereditary and idiopathic peripheral neuropathy   . Urticaria   . Vitamin B12 deficiency   . Wears glasses   . Wound cellulitis    right upper leg, healing well    Family History  Problem Relation Age of Onset  . Colon cancer Mother   . Prostate cancer Father   . Colon cancer Father   . Diabetes Maternal Aunt   . Breast cancer Maternal Aunt   . Diabetes Maternal Uncle   . Diabetes Paternal Aunt   . Stroke Paternal Aunt        > 65  . Heart disease Paternal Aunt   . Diabetes Paternal Uncle   . Breast cancer Maternal Aunt         X 2    . Breast cancer Cousin     Past Surgical History:  Procedure Laterality Date  . ABDOMINAL HYSTERECTOMY    . CARDIAC CATHETERIZATION  2002   non obstructive disease  . colonoscopy with polypectomy  2007 & 2012    hyperplastic ;Dr Watt Climes  . COLONOSCOPY WITH PROPOFOL N/A 06/04/2015   Procedure: COLONOSCOPY WITH PROPOFOL;  Surgeon: Jerene Bears, MD;  Location: [facility];  Service: Gastroenterology;  Laterality: N/A;  . DEBRIDEMENT LEG Right 03/02/2018   WOUND VAC APPLIED  . DEBRIDEMENT LEG Right 05/27/2018   RIGHT LOWER LEG DEBRIDEMENT,  SKIN GRAFT, VAC PLACEMENT   . DILATION AND CURETTAGE OF UTERUS     multiple  . HEMORRHOID SURGERY    . I & D EXTREMITY Right 03/02/2018   Procedure: RIGHT LEG DEBRIDEMENT AND PLACE VAC;  Surgeon: [name], MD;  Location: [place];  Service: Orthopedics;  Laterality: Right;  . I & D EXTREMITY Right 03/04/2018   Procedure: REPEAT IRRIGATION AND DEBRIDEMENT RIGHT LEG, PLACE WOUND VAC;  Surgeon: [name], MD;  Location: [place];  Service: Orthopedics;  Laterality: Right;  . I & D EXTREMITY Right 03/30/2018   Procedure: IRRIGATION AND DEBRIDEMENT RIGHT LEG, APPLY WOUND VAC;  Surgeon: [name], MD;  Location: [place];  Service: Orthopedics;  Laterality: Right;  . I & D EXTREMITY Right 05/27/2018   Procedure: RIGHT LOWER LEG DEBRIDEMENT,  SKIN GRAFT, VAC PLACEMENT;  Surgeon: [name], MD;  Location: [place];  Service: Orthopedics;  Laterality: Right;  RIGHT LOWER LEG DEBRIDEMENT,  SKIN GRAFT, VAC PLACEMENT  . renal calculi  12/1997   SVT with induction of anesthesia  . right knee arthroscopy    . SKIN SPLIT GRAFT Right 03/04/2018   Procedure: POSSIBLE SPLIT THICKNESS SKIN GRAFT;  Surgeon: [name], MD;  Location[facility]  OR;  Service: Orthopedics;  Laterality: Right;  . SKIN SPLIT GRAFT Right 04/01/2018   Procedure: REPEAT IRRIGATION AND DEBRIDEMENT RIGHT LEG, APPLY SPLIT THICKNESS SKIN GRAFT;  Surgeon: [name], MD;  Location: [place];  Service:  Orthopedics;  Laterality: Right;  . TONSILLECTOMY AND ADENOIDECTOMY     Social History   Occupational History  . Occupation: Disabled    Employer: RETIRED  Tobacco Use  . Smoking status: Never Smoker  . Smokeless tobacco: Never Used  Substance and Sexual Activity  . Alcohol use: No    Alcohol/week: 0.0 standard drinks  . Drug use: No  . Sexual activity: Not on file

## 2019-06-22 DIAGNOSIS — M25569 Pain in unspecified knee: Secondary | ICD-10-CM | POA: Diagnosis not present

## 2019-06-22 DIAGNOSIS — M109 Gout, unspecified: Secondary | ICD-10-CM | POA: Diagnosis not present

## 2019-06-22 DIAGNOSIS — F39 Unspecified mood [affective] disorder: Secondary | ICD-10-CM | POA: Diagnosis not present

## 2019-06-30 DIAGNOSIS — L97811 Non-pressure chronic ulcer of other part of right lower leg limited to breakdown of skin: Secondary | ICD-10-CM | POA: Diagnosis not present

## 2019-06-30 DIAGNOSIS — L97821 Non-pressure chronic ulcer of other part of left lower leg limited to breakdown of skin: Secondary | ICD-10-CM | POA: Diagnosis not present

## 2019-07-13 ENCOUNTER — Telehealth (HOSPITAL_COMMUNITY): Payer: Self-pay

## 2019-07-13 NOTE — Telephone Encounter (Signed)
Patient called requesting an appointment with Dr. Haroldine Laws. Patient hasn't been seen since 2015. She currently resides in a nursing home

## 2019-07-15 DIAGNOSIS — L97919 Non-pressure chronic ulcer of unspecified part of right lower leg with unspecified severity: Secondary | ICD-10-CM | POA: Diagnosis not present

## 2019-07-15 DIAGNOSIS — M6281 Muscle weakness (generalized): Secondary | ICD-10-CM | POA: Diagnosis not present

## 2019-07-15 DIAGNOSIS — J449 Chronic obstructive pulmonary disease, unspecified: Secondary | ICD-10-CM | POA: Diagnosis not present

## 2019-07-15 DIAGNOSIS — M109 Gout, unspecified: Secondary | ICD-10-CM | POA: Diagnosis not present

## 2019-07-18 ENCOUNTER — Ambulatory Visit: Payer: Medicare PPO | Admitting: Sports Medicine

## 2019-07-18 ENCOUNTER — Encounter: Payer: Self-pay | Admitting: Sports Medicine

## 2019-07-18 ENCOUNTER — Other Ambulatory Visit: Payer: Self-pay

## 2019-07-18 VITALS — Temp 95.7°F

## 2019-07-18 DIAGNOSIS — M2141 Flat foot [pes planus] (acquired), right foot: Secondary | ICD-10-CM

## 2019-07-18 DIAGNOSIS — E1142 Type 2 diabetes mellitus with diabetic polyneuropathy: Secondary | ICD-10-CM

## 2019-07-18 DIAGNOSIS — I890 Lymphedema, not elsewhere classified: Secondary | ICD-10-CM | POA: Diagnosis not present

## 2019-07-18 DIAGNOSIS — B351 Tinea unguium: Secondary | ICD-10-CM | POA: Diagnosis not present

## 2019-07-18 DIAGNOSIS — M2142 Flat foot [pes planus] (acquired), left foot: Secondary | ICD-10-CM

## 2019-07-18 NOTE — Progress Notes (Signed)
Subjective: April [name] is a 69 y.o. female patient with history of diabetes who returns to office today complaining of long, painful nails; unable to trim. Patient states that the glucose reading was recorded by Facility, [facility]. Reports that she is there trying to get stronger and has been unable to walk due to pain and swelling in legs. No other issues noted.   Patient Active Problem List   Diagnosis Date Noted   SOB (shortness of breath)    Acute idiopathic gout of right foot    Morbid obesity (HCC)    Non-pressure chronic ulcer of left heel and midfoot limited to breakdown of skin ([place])    Idiopathic chronic gout of multiple sites without tophus    Cellulitis 02/02/2019   Pneumonia due to COVID-19 virus 12/10/2018   COVID-19 virus infection 12/06/2018   Chronic venous hypertension (idiopathic) with ulcer of right lower extremity (CODE) ([place]) 11/09/2018   UTI (urinary tract infection) 11/09/2018   Right hip pain 11/09/2018   Achilles tendon sprain, left, initial encounter    Osteomyelitis of ankle or foot, acute, left ([place]) 09/07/2018   Osteomyelitis of ankle or foot, left, acute ([place]) 09/07/2018   Acute on chronic kidney failure ([place]) 08/15/2018   Onychomycosis 05/19/2018   Idiopathic chronic venous hypertension of right lower extremity with ulcer and inflammation ([place]) 05/19/2018   Traumatic wound dehiscence    Skin lumps 01/14/2018   Bilateral leg cramps 12/14/2017   Left shoulder pain 08/14/2016   Meralgia paresthetica 07/16/2016   Chronic left-sided low back pain with left-sided sciatica 06/02/2016   AKI (acute kidney injury) ([place]) 03/28/2016   Whole body pain 03/20/2016   Neuropathy 03/20/2016   Diabetic peripheral neuropathy ([place]) 03/20/2016   Osteopenia 01/11/2016   CKD (chronic kidney disease) stage 3, GFR 30-59 ml/min (HCC) 01/02/2016   Hand paresthesia 07/18/2015   Carpal tunnel syndrome 06/07/2015   Family history of colon  cancer    Diabetes mellitus with neurological manifestations ([place]) 04/18/2015   Bilateral leg edema 04/11/2015   Abnormality of gait 01/03/2015   Hereditary and idiopathic peripheral neuropathy 01/03/2015   GERD (gastroesophageal reflux disease) 06/17/2014   Hx of colonic polyps 12/15/2012   Vitamin B12 deficiency 11/03/2012   Intrinsic asthma 03/23/2012   Chronic diastolic heart failure ([place]) 02/20/2011   OSA (obstructive sleep apnea) 09/17/2010   URINARY URGENCY 01/08/2010   CAD, NATIVE VESSEL 11/20/2008   Osteoarthritis of knees, bilateral 06/14/2008   Hyperlipidemia 05/10/2007   Essential hypertension 01/18/2007   Hypokalemia 04/30/2006   Morbid (severe) obesity due to excess calories ([place]) 04/30/2006   HX, PERSONAL, PEPTIC ULCER DISEASE 04/30/2006   Current Outpatient Medications on File Prior to Visit  Medication Sig Dispense Refill   acetaminophen (TYLENOL) 325 MG tablet Take 2 tablets (650 mg total) by mouth every 6 (six) hours as needed for moderate pain (or Fever >/= 101). 20 tablet 0   allopurinol (ZYLOPRIM) 100 MG tablet Take 1 tablet (100 mg total) by mouth daily. 90 tablet 1   aspirin (ECOTRIN LOW STRENGTH) 81 MG EC tablet Take 81 mg by mouth at bedtime.      Budesonide (PULMICORT FLEXHALER) 90 MCG/ACT inhaler Inhale 2 puffs into the lungs 2 (two) times daily.     budesonide-formoterol (SYMBICORT) 160-4.5 MCG/ACT inhaler one - two inhalations every 12 hours; gargle and spit after use (Patient taking differently: Inhale 2 puffs into the lungs 2 (two) times daily. gargle and spit after use) 1 Inhaler 4   calcium  carbonate (TUMS - DOSED IN MG ELEMENTAL CALCIUM) 500 MG chewable tablet Chew 2 tablets by mouth daily as needed for indigestion or heartburn.     carvedilol (COREG) 12.5 MG tablet TAKE 1 TABLET(12.5 MG) BY MOUTH TWICE DAILY WITH A MEAL 180 tablet 0   cholecalciferol (VITAMIN D3) 25 MCG (1000 UT) tablet Take 1,000 Units by mouth daily.      COD LIVER OIL PO Take 1 capsule by mouth daily.     colchicine 0.6 MG tablet Take 0.5 tablets (0.3 mg total) by mouth daily. 90 tablet 1   cyanocobalamin (,VITAMIN B-12,) 1000 MCG/ML injection INJECT 1ML INTO MUSCLE EVERY 30 DAYS (Patient taking differently: Inject 1,000 mcg into the muscle every 30 (thirty) days. ) 10 mL 0   diclofenac sodium (VOLTAREN) 1 % GEL Apply 2 g topically 4 (four) times daily. 100 g 11   estradiol (ESTRACE) 0.1 MG/GM vaginal cream Place 1 Applicatorful vaginally at bedtime. 42.5 g 12   famotidine (PEPCID) 20 MG tablet Take 1 tablet (20 mg total) by mouth 2 (two) times daily. 180 tablet 3   ferrous sulfate 325 (65 FE) MG tablet Take 1 tablet (325 mg total) by mouth every Monday, Wednesday, and Friday. 60 tablet 0   folic acid (FOLVITE) 710 MCG tablet Take 400 mcg by mouth at bedtime.      furosemide (LASIX) 80 MG tablet TAKE 1 TABLET(80 MG) BY MOUTH TWICE DAILY 180 tablet 0   guaiFENesin-dextromethorphan (ROBITUSSIN DM) 100-10 MG/5ML syrup Take 5 mLs by mouth every 6 (six) hours as needed for cough. 118 mL 0   hydrocortisone (ANUSOL-HC) 2.5 % rectal cream Place 1 application rectally 2 (two) times daily. 30 g 0   hydrOXYzine (ATARAX/VISTARIL) 25 MG tablet Take 1 tablet (25 mg total) by mouth 3 (three) times daily as needed for itching. 30 tablet 0   lactobacillus acidophilus (BACID) TABS tablet Take 2 tablets by mouth 3 (three) times daily. 30 tablet 0   montelukast (SINGULAIR) 10 MG tablet Take 1 tablet (10 mg total) by mouth at bedtime. 30 tablet 5   Oxcarbazepine (TRILEPTAL) 300 MG tablet Take 1 tablet (300 mg total) by mouth 2 (two) times daily. 60 tablet 0   oxyCODONE (OXY IR/ROXICODONE) 5 MG immediate release tablet Take 1 tablet (5 mg total) by mouth every 6 (six) hours as needed for moderate pain. 5 tablet 0   polyethylene glycol (MIRALAX / GLYCOLAX) 17 g packet Take 17 g by mouth daily. 14 each 0   potassium chloride SA (KLOR-CON) 20 MEQ tablet  Take 20 mEq by mouth 3 (three) times daily.     senna-docusate (SENOKOT-S) 8.6-50 MG tablet Take 1 tablet by mouth at bedtime as needed for mild constipation. 30 tablet 0   silver sulfADIAZINE (SILVADENE) 1 % cream Apply 1 application topically daily. 400 g 0   simvastatin (ZOCOR) 40 MG tablet Take 1 tablet (40 mg total) by mouth at bedtime. 90 tablet 1   spironolactone (ALDACTONE) 25 MG tablet Take 1 tablet (25 mg total) by mouth daily. 90 tablet 0   gabapentin (NEURONTIN) 100 MG capsule Take 1 capsule (100 mg total) by mouth 3 (three) times daily. 90 capsule 0   No current facility-administered medications on file prior to visit.   Allergies  Allergen Reactions   Penicillins Rash and Other (See Comments)    She was told not to take it anymore. Has patient had a PCN reaction causing immediate rash, facial/tongue/throat swelling, SOB or lightheadedness with hypotension:  Yes Has patient had a PCN reaction causing severe rash involving mucus membranes or skin necrosis: No Has patient had a PCN reaction that required hospitalization: No Has patient had a PCN reaction occurring within the last 10 years: No If all of the above answers are "NO", then may proceed with Cephalosporin use.'   Shellfish Allergy Anaphylaxis   Sulfonamide Derivatives Anaphylaxis and Other (See Comments)   Hydrochlorothiazide W-Triamterene Other (See Comments)    Hypokalemia   Lotensin [Benazepril Hcl] Hives   Dipyridamole Other (See Comments)    Unknown reaction   Estrogens Other (See Comments)    Unknown reaction   Hydrochlorothiazide Other (See Comments)    Unknown reaction   Metronidazole Other (See Comments)    Unknown reaction   Other Itching    PICKLES   Spironolactone Other (See Comments)    UNSPECIFIED > "kidney problems"   Sulfa Antibiotics Other (See Comments)    Unknown reaction   Torsemide Other (See Comments)    Unknown reaction   Valsartan Other (See Comments)    Unknown  reaction   Mustard [Allyl Isothiocyanate] Itching    No results found for this or any previous visit (from the past 2160 hour(s)).  Objective: General: Patient is awake, alert, and oriented x 3 and in no acute distress.  Integument: Skin is warm, dry and supple bilateral. Nails are tender, long, thickened and dystrophic with subungual debris, consistent with onychomycosis, 1-5 bilateral. No signs of infection. Dry wound to right anterior shin with no acute signs of infection.   Vasculature:  Dorsalis Pedis pulse 0/4 bilateral. Posterior Tibial pulse  0/4 bilateral. Capillary fill time <5 sec 1-5 bilateral. No hair growth to the level of the digits.Temperature gradient within normal limits. No varicosities present bilateral. Chronic edema present bilateral.   Neurology: The patient has diminished sensation measured with a 5.07/10g Semmes Weinstein Monofilament at all pedal sites bilateral . Vibratory sensation diminished bilateral with tuning fork. No Babinski sign present bilateral.   Musculoskeletal: Right 5th hammertoe and Asymptomatic pes planus pedal deformities noted bilateral. Muscular strength 4/5 in all lower extremity muscular groups bilateral without pain on range of motion. No tenderness with calf compression bilateral.  Assessment and Plan: Problem List Items Addressed This Visit      Musculoskeletal and Integument   Onychomycosis - Primary    Other Visit Diagnoses    Diabetic polyneuropathy associated with type 2 diabetes mellitus (HCC)       Lymphedema       Pes planus of both feet          -Examined patient. -Re-Discussed and educated patient on diabetic foot care, especially with  regards to the vascular, neurological and musculoskeletal systems.  -Mechanically debrided all nails 1-5 bilateral using sterile nail nipper and filed with dremel without incident   -Recommend continue with facility PT and wound care there and meds for edema control -Answered all patient  questions -Patient to return  in 3 months for at risk foot care -Patient advised to call the office if any problems or questions arise in the meantime.  Landis Martins, DPM

## 2019-07-20 DIAGNOSIS — F39 Unspecified mood [affective] disorder: Secondary | ICD-10-CM | POA: Diagnosis not present

## 2019-07-20 DIAGNOSIS — M25569 Pain in unspecified knee: Secondary | ICD-10-CM | POA: Diagnosis not present

## 2019-07-20 DIAGNOSIS — M109 Gout, unspecified: Secondary | ICD-10-CM | POA: Diagnosis not present

## 2019-07-24 DIAGNOSIS — Z79899 Other long term (current) drug therapy: Secondary | ICD-10-CM | POA: Diagnosis not present

## 2019-07-25 ENCOUNTER — Ambulatory Visit
Admission: RE | Admit: 2019-07-25 | Discharge: 2019-07-25 | Disposition: A | Discharge status: Home / Self Care | Payer: Medicare PPO | Source: Ambulatory Visit | Attending: Obstetrics & Gynecology | Admitting: Obstetrics & Gynecology

## 2019-07-25 ENCOUNTER — Other Ambulatory Visit: Payer: Self-pay

## 2019-07-25 DIAGNOSIS — N6021 Fibroadenosis of right breast: Secondary | ICD-10-CM

## 2019-07-25 DIAGNOSIS — R928 Other abnormal and inconclusive findings on diagnostic imaging of breast: Secondary | ICD-10-CM | POA: Diagnosis not present

## 2019-08-09 ENCOUNTER — Telehealth: Payer: Self-pay | Admitting: Orthopedic Surgery

## 2019-08-09 DIAGNOSIS — R2241 Localized swelling, mass and lump, right lower limb: Secondary | ICD-10-CM | POA: Diagnosis not present

## 2019-08-09 NOTE — Telephone Encounter (Signed)
Patient called asked if Dr. [name] would write a letter to [facility] stating this was an accident due to patient falling in her kitchen. Patient said the insurance company has not paid  02/16/2019,03/31/2019-04/05/2019 and 08/15/2019-08/23/2019 because it didn't state this was due to an accident. Patient asked if the letter can be mailed to [name]  [id number]  Claim# [id number] Patient also said she would like to pick up a copy of the letter.

## 2019-08-10 DIAGNOSIS — N1831 Chronic kidney disease, stage 3a: Secondary | ICD-10-CM | POA: Diagnosis not present

## 2019-08-10 DIAGNOSIS — F39 Unspecified mood [affective] disorder: Secondary | ICD-10-CM | POA: Diagnosis not present

## 2019-08-10 DIAGNOSIS — M25569 Pain in unspecified knee: Secondary | ICD-10-CM | POA: Diagnosis not present

## 2019-08-10 DIAGNOSIS — N189 Chronic kidney disease, unspecified: Secondary | ICD-10-CM | POA: Diagnosis not present

## 2019-08-10 DIAGNOSIS — I5032 Chronic diastolic (congestive) heart failure: Secondary | ICD-10-CM | POA: Diagnosis not present

## 2019-08-10 DIAGNOSIS — E1122 Type 2 diabetes mellitus with diabetic chronic kidney disease: Secondary | ICD-10-CM | POA: Diagnosis not present

## 2019-08-10 DIAGNOSIS — I890 Lymphedema, not elsewhere classified: Secondary | ICD-10-CM | POA: Diagnosis not present

## 2019-08-10 DIAGNOSIS — M109 Gout, unspecified: Secondary | ICD-10-CM | POA: Diagnosis not present

## 2019-08-10 NOTE — Telephone Encounter (Signed)
Please send the insurance company documentation that states that her injury was secondary to a fall in the kitchen.  This was a traumatic injury.

## 2019-08-10 NOTE — Telephone Encounter (Signed)
Dr [name] please advise below, thank you. Is it okay to write note for these dates?

## 2019-08-11 ENCOUNTER — Ambulatory Visit: Payer: Medicare PPO | Admitting: Family

## 2019-08-11 NOTE — Telephone Encounter (Signed)
Is there a charge for this or a release that needs to be signed? Can you inform the pt?

## 2019-08-14 ENCOUNTER — Ambulatory Visit: Payer: Medicare PPO | Admitting: Physician Assistant

## 2019-08-14 ENCOUNTER — Encounter: Payer: Self-pay | Admitting: Orthopedic Surgery

## 2019-08-14 ENCOUNTER — Other Ambulatory Visit: Payer: Self-pay

## 2019-08-14 VITALS — Ht 64.0 in | Wt 340.0 lb

## 2019-08-14 DIAGNOSIS — M1A09X0 Idiopathic chronic gout, multiple sites, without tophus (tophi): Secondary | ICD-10-CM

## 2019-08-14 NOTE — Progress Notes (Signed)
Office Visit Note   Patient: April [name]           Date of Birth: 1950-08-17           MRN: [id number] Visit Date: 08/14/2019              Requested by: [name], MD [place],  [place] [address] PCP: [name], MD  Chief Complaint  Patient presents with  . Right Leg - Pain, Follow-up, Edema      HPI: This is a pleasant 69 year old woman with bilateral lower extremity edema and venous stasis disease.  She has been having her legs wrapped at the nursing facility.  She has noticed that she has pain in the right side especially.  From where the wrap goes down into her foot.  Both wraps of slip down or only wrap to 2 just above her ankle level Assessment & Plan: Visit Diagnoses: No diagnosis found.  Plan: Patient was seen today by Dr. [name] is imperative that the wraps be wrapped up to the tibial tubercle bilaterally.  If these rolled down they should be replaced immediately.  This was written on the nursing home order she will follow-up in 3 weeks Follow-Up Instructions: No follow-ups on file.   Ortho Exam  Patient is alert, oriented, no adenopathy, well-dressed, normal affect, normal respiratory effort. Bilateral lower extremities right lower extremity she has indentation from where the wrap has slipped down.  She has 1 very small area about 2 mm in circle that shows superficial skin abrasion..  Left lower extremity also looks excellent however in order for the Dynaflex wraps to be effective will need to be wrapped up to the tibial tubercle.  Follow-up in 3 weeks no cellulitis no sign of infection  ver Imaging: No results found. No images are attached to the encounter.  Labs: Lab Results  Component Value Date   HGBA1C 6.0 (H) 09/08/2018   HGBA1C 6.4 07/15/2018   HGBA1C 6.1 (H) 05/27/2018   ESRSEDRATE 42 (H) 08/12/2018   ESRSEDRATE 56 (H) 03/09/2018   ESRSEDRATE 13 06/02/2016   CRP 1.6 (H) 02/04/2019   CRP 1.5 (H) 12/10/2018   CRP 2.1 (H)  12/09/2018   LABURIC 11.1 (H) 02/04/2019   LABURIC 10.5 (H) 09/13/2018   LABURIC 11.0 (H) 09/11/2018   REPTSTATUS 02/17/2019 FINAL 02/15/2019   GRAMSTAIN  05/27/2018    FEW WBC PRESENT, PREDOMINANTLY PMN MODERATE GRAM POSITIVE RODS RARE GRAM POSITIVE COCCI IN PAIRS    CULT (A) 02/15/2019    80,000 COLONIES/mL CITROBACTER FREUNDII 80,000 COLONIES/mL PSEUDOMONAS AERUGINOSA    LABORGA CITROBACTER FREUNDII (A) 02/15/2019   LABORGA PSEUDOMONAS AERUGINOSA (A) 02/15/2019     Lab Results  Component Value Date   ALBUMIN 3.2 (L) 02/23/2019   ALBUMIN 3.0 (L) 12/10/2018   ALBUMIN 3.1 (L) 12/09/2018   LABURIC 11.1 (H) 02/04/2019   LABURIC 10.5 (H) 09/13/2018   LABURIC 11.0 (H) 09/11/2018    Lab Results  Component Value Date   MG 2.2 02/15/2019   MG 2.2 02/14/2019   MG 2.2 02/13/2019   Lab Results  Component Value Date   VD25OH 47.2 06/02/2016   VD25OH 42 01/23/2011    No results found for: PREALBUMIN CBC EXTENDED Latest Ref Rng & Units 02/27/2019 02/23/2019 02/15/2019  WBC 4.0 - 10.5 K/uL 4.5 5.9 -  RBC 3.87 - 5.11 MIL/uL 3.88 4.05 -  HGB 12.0 - 15.0 g/dL 11.7(L) 12.1 11.7(L)  HCT 36 - 46 % 36.5  38.7 37.0  PLT 150 - 400 K/uL 160 196 -  NEUTROABS 1.7 - 7.7 K/uL 2.3 3.2 -  LYMPHSABS 0.7 - 4.0 K/uL 1.3 1.9 -     Body mass index is 58.36 kg/m.  Orders:  No orders of the defined types were placed in this encounter.  No orders of the defined types were placed in this encounter.    Procedures: No procedures performed  Clinical Data: No additional findings.  ROS:  All other systems negative, except as noted in the HPI. Review of Systems  Objective: Vital Signs: Ht 5\' 4"  (1.626 m)   Wt (!) 340 lb (154.2 kg)   BMI 58.36 kg/m   Specialty Comments:  No specialty comments available.  PMFS History: Patient Active Problem List   Diagnosis Date Noted  . SOB (shortness of breath)   . Acute idiopathic gout of right foot   . Morbid obesity ([place])   . Non-pressure  chronic ulcer of left heel and midfoot limited to breakdown of skin ([place])   . Idiopathic chronic gout of multiple sites without tophus   . Cellulitis 02/02/2019  . Pneumonia due to COVID-19 virus 12/10/2018  . COVID-19 virus infection 12/06/2018  . Chronic venous hypertension (idiopathic) with ulcer of right lower extremity (CODE) ([place]) 11/09/2018  . UTI (urinary tract infection) 11/09/2018  . Right hip pain 11/09/2018  . Achilles tendon sprain, left, initial encounter   . Osteomyelitis of ankle or foot, acute, left ([place]) 09/07/2018  . Osteomyelitis of ankle or foot, left, acute ([place]) 09/07/2018  . Acute on chronic kidney failure ([place]) 08/15/2018  . Onychomycosis 05/19/2018  . Idiopathic chronic venous hypertension of right lower extremity with ulcer and inflammation ([place]) 05/19/2018  . Traumatic wound dehiscence   . Skin lumps 01/14/2018  . Bilateral leg cramps 12/14/2017  . Left shoulder pain 08/14/2016  . Meralgia paresthetica 07/16/2016  . Chronic left-sided low back pain with left-sided sciatica 06/02/2016  . AKI (acute kidney injury) ([place]) 03/28/2016  . Whole body pain 03/20/2016  . Neuropathy 03/20/2016  . Diabetic peripheral neuropathy ([place]) 03/20/2016  . Osteopenia 01/11/2016  . CKD (chronic kidney disease) stage 3, GFR 30-59 ml/min (HCC) 01/02/2016  . Hand paresthesia 07/18/2015  . Carpal tunnel syndrome 06/07/2015  . Family history of colon cancer   . Diabetes mellitus with neurological manifestations ([place]) 04/18/2015  . Bilateral leg edema 04/11/2015  . Abnormality of gait 01/03/2015  . Hereditary and idiopathic peripheral neuropathy 01/03/2015  . GERD (gastroesophageal reflux disease) 06/17/2014  . Hx of colonic polyps 12/15/2012  . Vitamin B12 deficiency 11/03/2012  . Intrinsic asthma 03/23/2012  . Chronic diastolic heart failure ([place]) 02/20/2011  . OSA (obstructive sleep apnea) 09/17/2010  . URINARY URGENCY 01/08/2010  . CAD, NATIVE VESSEL 11/20/2008  .  Osteoarthritis of knees, bilateral 06/14/2008  . Hyperlipidemia 05/10/2007  . Essential hypertension 01/18/2007  . Hypokalemia 04/30/2006  . Morbid (severe) obesity due to excess calories ([place]) 04/30/2006  . HX, PERSONAL, PEPTIC ULCER DISEASE 04/30/2006   Past Medical History:  Diagnosis Date  . Anemia   . Asthma   . CAD (coronary artery disease)   . Carpal tunnel syndrome   . Cellulitis of both lower extremities 04/11/2015  . CHF (congestive heart failure) ([place])   . Chronic kidney disease    [facility]- followed by [facility]  . Colon polyp, hyperplastic 2007 & 2012  . Complication of anesthesia 1999   svt with renal calculi surgery, no problems since  . CTS (carpal  tunnel syndrome)    bilateral  . Diabetes mellitus   . Eczema   . GERD (gastroesophageal reflux disease)   . History of kidney stones 1999  . Hyperlipidemia   . Hypertension   . Leg ulcer ([place]) 04/24/2015   Right lateral leg No evidence of an infection Monitor closely Keep edema controlled   . Leg ulcer ([place])    right lower  . Meralgia paresthetica    Dr. [name]  . Morbid obesity ([place])   . Morbid obesity ([place])   . Neuropathy    toes and legs  . Osteoarthrosis, unspecified whether generalized or localized, lower leg    knee  . PUD (peptic ulcer disease)   . Shortness of breath dyspnea    with exertion  . Sleep apnea    per progress note 02/25/2018  . Type II or unspecified type diabetes mellitus without mention of complication, not stated as uncontrolled   . Unspecified hereditary and idiopathic peripheral neuropathy   . Urticaria   . Vitamin B12 deficiency   . Wears glasses   . Wound cellulitis    right upper leg, healing well    Family History  Problem Relation Age of Onset  . Colon cancer Mother   . Prostate cancer Father   . Colon cancer Father   . Diabetes Maternal Aunt   . Breast cancer Maternal Aunt   . Diabetes Maternal Uncle   . Diabetes Paternal Aunt   . Stroke Paternal Aunt        > 65  .  Heart disease Paternal Aunt   . Diabetes Paternal Uncle   . Breast cancer Maternal Aunt         X 2  . Breast cancer Cousin     Past Surgical History:  Procedure Laterality Date  . ABDOMINAL HYSTERECTOMY    . BREAST BIOPSY Right 07/08/2018  . CARDIAC CATHETERIZATION  2002   non obstructive disease  . colonoscopy with polypectomy  2007 & 2012    hyperplastic ;Dr Watt Climes  . COLONOSCOPY WITH PROPOFOL N/A 06/04/2015   Procedure: COLONOSCOPY WITH PROPOFOL;  Surgeon: Jerene Bears, MD;  Location: [facility];  Service: Gastroenterology;  Laterality: N/A;  . DEBRIDEMENT LEG Right 03/02/2018   WOUND VAC APPLIED  . DEBRIDEMENT LEG Right 05/27/2018   RIGHT LOWER LEG DEBRIDEMENT,  SKIN GRAFT, VAC PLACEMENT   . DILATION AND CURETTAGE OF UTERUS     multiple  . HEMORRHOID SURGERY    . I & D EXTREMITY Right 03/02/2018   Procedure: RIGHT LEG DEBRIDEMENT AND PLACE VAC;  Surgeon: [name], MD;  Location: [place];  Service: Orthopedics;  Laterality: Right;  . I & D EXTREMITY Right 03/04/2018   Procedure: REPEAT IRRIGATION AND DEBRIDEMENT RIGHT LEG, PLACE WOUND VAC;  Surgeon: [name], MD;  Location: [place];  Service: Orthopedics;  Laterality: Right;  . I & D EXTREMITY Right 03/30/2018   Procedure: IRRIGATION AND DEBRIDEMENT RIGHT LEG, APPLY WOUND VAC;  Surgeon: [name], MD;  Location: [place];  Service: Orthopedics;  Laterality: Right;  . I & D EXTREMITY Right 05/27/2018   Procedure: RIGHT LOWER LEG DEBRIDEMENT,  SKIN GRAFT, VAC PLACEMENT;  Surgeon: [name], MD;  Location: [place];  Service: Orthopedics;  Laterality: Right;  RIGHT LOWER LEG DEBRIDEMENT,  SKIN GRAFT, VAC PLACEMENT  . renal calculi  12/1997   SVT with induction of anesthesia  . right knee arthroscopy    . SKIN SPLIT GRAFT Right  03/04/2018   Procedure: POSSIBLE SPLIT THICKNESS SKIN GRAFT;  Surgeon: [name], MD;  Location: [place];  Service: Orthopedics;  Laterality: Right;  . SKIN SPLIT GRAFT Right 04/01/2018    Procedure: REPEAT IRRIGATION AND DEBRIDEMENT RIGHT LEG, APPLY SPLIT THICKNESS SKIN GRAFT;  Surgeon: [name], MD;  Location: [place];  Service: Orthopedics;  Laterality: Right;  . TONSILLECTOMY AND ADENOIDECTOMY     Social History   Occupational History  . Occupation: Disabled    Employer: RETIRED  Tobacco Use  . Smoking status: Never Smoker  . Smokeless tobacco: Never Used  Vaping Use  . Vaping Use: Never used  Substance and Sexual Activity  . Alcohol use: No    Alcohol/week: 0.0 standard drinks  . Drug use: No  . Sexual activity: Not on file

## 2019-08-17 ENCOUNTER — Telehealth: Payer: Self-pay | Admitting: Orthopedic Surgery

## 2019-08-17 NOTE — Telephone Encounter (Signed)
Patient called requesting Dr. [name] write orders for patient to continue physical therapy. Patient states [facility] recommends a tleast 3 times a week. Patient states PT did not say for how long. Please contact patient about this matter at [phone].

## 2019-08-18 ENCOUNTER — Encounter: Payer: Self-pay | Admitting: Orthopedic Surgery

## 2019-08-18 NOTE — Telephone Encounter (Signed)
Patient RX was faxed to [facility] for PT at [phone].

## 2019-08-22 ENCOUNTER — Telehealth: Payer: Self-pay

## 2019-08-22 DIAGNOSIS — L03115 Cellulitis of right lower limb: Secondary | ICD-10-CM | POA: Diagnosis not present

## 2019-08-22 DIAGNOSIS — R278 Other lack of coordination: Secondary | ICD-10-CM | POA: Diagnosis not present

## 2019-08-22 NOTE — Telephone Encounter (Signed)
I called and sw pt to advise that this had been faxed last week and that this was faxed again this morning

## 2019-08-22 NOTE — Telephone Encounter (Signed)
Patient  Called in saying she is still waiting on a phone call to get her ref to start PT 3xs a week.

## 2019-08-23 DIAGNOSIS — R278 Other lack of coordination: Secondary | ICD-10-CM | POA: Diagnosis not present

## 2019-08-23 DIAGNOSIS — L03115 Cellulitis of right lower limb: Secondary | ICD-10-CM | POA: Diagnosis not present

## 2019-08-24 DIAGNOSIS — L03115 Cellulitis of right lower limb: Secondary | ICD-10-CM | POA: Diagnosis not present

## 2019-08-24 DIAGNOSIS — R278 Other lack of coordination: Secondary | ICD-10-CM | POA: Diagnosis not present

## 2019-08-25 DIAGNOSIS — R278 Other lack of coordination: Secondary | ICD-10-CM | POA: Diagnosis not present

## 2019-08-25 DIAGNOSIS — L03115 Cellulitis of right lower limb: Secondary | ICD-10-CM | POA: Diagnosis not present

## 2019-08-27 DIAGNOSIS — I509 Heart failure, unspecified: Secondary | ICD-10-CM | POA: Diagnosis not present

## 2019-08-27 DIAGNOSIS — J810 Acute pulmonary edema: Secondary | ICD-10-CM | POA: Diagnosis not present

## 2019-08-27 DIAGNOSIS — I517 Cardiomegaly: Secondary | ICD-10-CM | POA: Diagnosis not present

## 2019-08-27 DIAGNOSIS — I272 Pulmonary hypertension, unspecified: Secondary | ICD-10-CM | POA: Diagnosis not present

## 2019-08-28 DIAGNOSIS — L03115 Cellulitis of right lower limb: Secondary | ICD-10-CM | POA: Diagnosis not present

## 2019-08-28 DIAGNOSIS — R278 Other lack of coordination: Secondary | ICD-10-CM | POA: Diagnosis not present

## 2019-08-29 DIAGNOSIS — L03115 Cellulitis of right lower limb: Secondary | ICD-10-CM | POA: Diagnosis not present

## 2019-08-29 DIAGNOSIS — R278 Other lack of coordination: Secondary | ICD-10-CM | POA: Diagnosis not present

## 2019-08-30 DIAGNOSIS — R278 Other lack of coordination: Secondary | ICD-10-CM | POA: Diagnosis not present

## 2019-08-30 DIAGNOSIS — L03115 Cellulitis of right lower limb: Secondary | ICD-10-CM | POA: Diagnosis not present

## 2019-08-31 DIAGNOSIS — R278 Other lack of coordination: Secondary | ICD-10-CM | POA: Diagnosis not present

## 2019-08-31 DIAGNOSIS — L03115 Cellulitis of right lower limb: Secondary | ICD-10-CM | POA: Diagnosis not present

## 2019-09-01 DIAGNOSIS — R278 Other lack of coordination: Secondary | ICD-10-CM | POA: Diagnosis not present

## 2019-09-01 DIAGNOSIS — L03115 Cellulitis of right lower limb: Secondary | ICD-10-CM | POA: Diagnosis not present

## 2019-09-04 ENCOUNTER — Encounter: Payer: Self-pay | Admitting: Physician Assistant

## 2019-09-04 ENCOUNTER — Ambulatory Visit (INDEPENDENT_AMBULATORY_CARE_PROVIDER_SITE_OTHER): Payer: Medicare PPO | Admitting: Physician Assistant

## 2019-09-04 ENCOUNTER — Other Ambulatory Visit: Payer: Self-pay

## 2019-09-04 VITALS — Ht 64.0 in | Wt 340.0 lb

## 2019-09-04 DIAGNOSIS — E1100 Type 2 diabetes mellitus with hyperosmolarity without nonketotic hyperglycemic-hyperosmolar coma (NKHHC): Secondary | ICD-10-CM | POA: Diagnosis not present

## 2019-09-04 DIAGNOSIS — I10 Essential (primary) hypertension: Secondary | ICD-10-CM | POA: Diagnosis not present

## 2019-09-04 DIAGNOSIS — Z79899 Other long term (current) drug therapy: Secondary | ICD-10-CM | POA: Diagnosis not present

## 2019-09-04 DIAGNOSIS — Z945 Skin transplant status: Secondary | ICD-10-CM | POA: Diagnosis not present

## 2019-09-04 NOTE — Progress Notes (Signed)
Office Visit Note   Patient: April [name]           Date of Birth: 1951/01/22           MRN: [id number] Visit Date: 09/04/2019              Requested by: [name], MD [place],  [place] [address] PCP: [name], MD  Chief Complaint  Patient presents with  . Right Leg - Follow-up  . Left Leg - Follow-up      HPI: A pleasant 69 year old woman who is in a nursing facility.  She has bilateral lower extremity venous stasis disease.  We have tried wrapping her with Profore wraps and having her nursing facility wrap her in between.  She continues to have the wraps fall down to her ankles  Assessment & Plan: Visit Diagnoses: No diagnosis found.  Plan: We will do daily Ace wrapping for now.  We will place Silvadene with gauze over the top.  We try to apply wound dressings with wraps today and they immediately rolled down her legs.  We will follow up in 2 weeks with Dr. [name] emphasized the importance of elevating her legs and working with physical therapy.  This is a difficult problem as her wraps continually do not stay up to her tibial tubercle  Follow-Up Instructions: No follow-ups on file.   Ortho Exam  Patient is alert, oriented, no adenopathy, well-dressed, normal affect, normal respiratory effort. Bilateral lower extremities wraps have rolled down to ankle length.  On the right side she does have some eschars.  No foul odor no drainage compartments are all compressible no sign of infection or cellulitis  Imaging: No results found.    Labs: Lab Results  Component Value Date   HGBA1C 6.0 (H) 09/08/2018   HGBA1C 6.4 07/15/2018   HGBA1C 6.1 (H) 05/27/2018   ESRSEDRATE 42 (H) 08/12/2018   ESRSEDRATE 56 (H) 03/09/2018   ESRSEDRATE 13 06/02/2016   CRP 1.6 (H) 02/04/2019   CRP 1.5 (H) 12/10/2018   CRP 2.1 (H) 12/09/2018   LABURIC 11.1 (H) 02/04/2019   LABURIC 10.5 (H) 09/13/2018   LABURIC 11.0 (H) 09/11/2018   REPTSTATUS 02/17/2019 FINAL  02/15/2019   GRAMSTAIN  05/27/2018    FEW WBC PRESENT, PREDOMINANTLY PMN MODERATE GRAM POSITIVE RODS RARE GRAM POSITIVE COCCI IN PAIRS    CULT (A) 02/15/2019    80,000 COLONIES/mL CITROBACTER FREUNDII 80,000 COLONIES/mL PSEUDOMONAS AERUGINOSA    LABORGA CITROBACTER FREUNDII (A) 02/15/2019   LABORGA PSEUDOMONAS AERUGINOSA (A) 02/15/2019     Lab Results  Component Value Date   ALBUMIN 3.2 (L) 02/23/2019   ALBUMIN 3.0 (L) 12/10/2018   ALBUMIN 3.1 (L) 12/09/2018   LABURIC 11.1 (H) 02/04/2019   LABURIC 10.5 (H) 09/13/2018   LABURIC 11.0 (H) 09/11/2018    Lab Results  Component Value Date   MG 2.2 02/15/2019   MG 2.2 02/14/2019   MG 2.2 02/13/2019   Lab Results  Component Value Date   VD25OH 47.2 06/02/2016   VD25OH 42 01/23/2011    No results found for: PREALBUMIN CBC EXTENDED Latest Ref Rng & Units 02/27/2019 02/23/2019 02/15/2019  WBC 4.0 - 10.5 K/uL 4.5 5.9 -  RBC 3.87 - 5.11 MIL/uL 3.88 4.05 -  HGB 12.0 - 15.0 g/dL 11.7(L) 12.1 11.7(L)  HCT 36 - 46 % 36.5 38.7 37.0  PLT 150 - 400 K/uL 160 196 -  NEUTROABS 1.7 - 7.7 K/uL 2.3 3.2 -  LYMPHSABS 0.7 - 4.0 K/uL 1.3 1.9 -     Body mass index is 58.36 kg/m.  Orders:  No orders of the defined types were placed in this encounter.  No orders of the defined types were placed in this encounter.    Procedures: No procedures performed  Clinical Data: No additional findings.  ROS:  All other systems negative, except as noted in the HPI. Review of Systems  Objective: Vital Signs: Ht 5\' 4"  (1.626 m)   Wt (!) 340 lb (154.2 kg)   BMI 58.36 kg/m   Specialty Comments:  No specialty comments available.  PMFS History: Patient Active Problem List   Diagnosis Date Noted  . SOB (shortness of breath)   . Acute idiopathic gout of right foot   . Morbid obesity ([place])   . Non-pressure chronic ulcer of left heel and midfoot limited to breakdown of skin ([place])   . Idiopathic chronic gout of multiple sites without tophus    . Cellulitis 02/02/2019  . Pneumonia due to COVID-19 virus 12/10/2018  . COVID-19 virus infection 12/06/2018  . Chronic venous hypertension (idiopathic) with ulcer of right lower extremity (CODE) ([place]) 11/09/2018  . UTI (urinary tract infection) 11/09/2018  . Right hip pain 11/09/2018  . Achilles tendon sprain, left, initial encounter   . Osteomyelitis of ankle or foot, acute, left ([place]) 09/07/2018  . Osteomyelitis of ankle or foot, left, acute ([place]) 09/07/2018  . Acute on chronic kidney failure ([place]) 08/15/2018  . Onychomycosis 05/19/2018  . Idiopathic chronic venous hypertension of right lower extremity with ulcer and inflammation ([place]) 05/19/2018  . Traumatic wound dehiscence   . Skin lumps 01/14/2018  . Bilateral leg cramps 12/14/2017  . Left shoulder pain 08/14/2016  . Meralgia paresthetica 07/16/2016  . Chronic left-sided low back pain with left-sided sciatica 06/02/2016  . AKI (acute kidney injury) ([place]) 03/28/2016  . Whole body pain 03/20/2016  . Neuropathy 03/20/2016  . Diabetic peripheral neuropathy ([place]) 03/20/2016  . Osteopenia 01/11/2016  . CKD (chronic kidney disease) stage 3, GFR 30-59 ml/min (HCC) 01/02/2016  . Hand paresthesia 07/18/2015  . Carpal tunnel syndrome 06/07/2015  . Family history of colon cancer   . Diabetes mellitus with neurological manifestations ([place]) 04/18/2015  . Bilateral leg edema 04/11/2015  . Abnormality of gait 01/03/2015  . Hereditary and idiopathic peripheral neuropathy 01/03/2015  . GERD (gastroesophageal reflux disease) 06/17/2014  . Hx of colonic polyps 12/15/2012  . Vitamin B12 deficiency 11/03/2012  . Intrinsic asthma 03/23/2012  . Chronic diastolic heart failure ([place]) 02/20/2011  . OSA (obstructive sleep apnea) 09/17/2010  . URINARY URGENCY 01/08/2010  . CAD, NATIVE VESSEL 11/20/2008  . Osteoarthritis of knees, bilateral 06/14/2008  . Hyperlipidemia 05/10/2007  . Essential hypertension 01/18/2007  . Hypokalemia 04/30/2006  .  Morbid (severe) obesity due to excess calories ([place]) 04/30/2006  . HX, PERSONAL, PEPTIC ULCER DISEASE 04/30/2006   Past Medical History:  Diagnosis Date  . Anemia   . Asthma   . CAD (coronary artery disease)   . Carpal tunnel syndrome   . Cellulitis of both lower extremities 04/11/2015  . CHF (congestive heart failure) ([place])   . Chronic kidney disease    CKI- followed by [facility]  . Colon polyp, hyperplastic 2007 & 2012  . Complication of anesthesia 1999   svt with renal calculi surgery, no problems since  . CTS (carpal tunnel syndrome)    bilateral  . Diabetes mellitus   . Eczema   . GERD (gastroesophageal reflux disease)   .  History of kidney stones 1999  . Hyperlipidemia   . Hypertension   . Leg ulcer ([place]) 04/24/2015   Right lateral leg No evidence of an infection Monitor closely Keep edema controlled   . Leg ulcer ([place])    right lower  . Meralgia paresthetica    Dr. [name]  . Morbid obesity ([place])   . Morbid obesity ([place])   . Neuropathy    toes and legs  . Osteoarthrosis, unspecified whether generalized or localized, lower leg    knee  . PUD (peptic ulcer disease)   . Shortness of breath dyspnea    with exertion  . Sleep apnea    per progress note 02/25/2018  . Type II or unspecified type diabetes mellitus without mention of complication, not stated as uncontrolled   . Unspecified hereditary and idiopathic peripheral neuropathy   . Urticaria   . Vitamin B12 deficiency   . Wears glasses   . Wound cellulitis    right upper leg, healing well    Family History  Problem Relation Age of Onset  . Colon cancer Mother   . Prostate cancer Father   . Colon cancer Father   . Diabetes Maternal Aunt   . Breast cancer Maternal Aunt   . Diabetes Maternal Uncle   . Diabetes Paternal Aunt   . Stroke Paternal Aunt        > 65  . Heart disease Paternal Aunt   . Diabetes Paternal Uncle   . Breast cancer Maternal Aunt         X 2  . Breast cancer Cousin     Past  Surgical History:  Procedure Laterality Date  . ABDOMINAL HYSTERECTOMY    . BREAST BIOPSY Right 07/08/2018  . CARDIAC CATHETERIZATION  2002   non obstructive disease  . colonoscopy with polypectomy  2007 & 2012    hyperplastic ;Dr Watt Climes  . COLONOSCOPY WITH PROPOFOL N/A 06/04/2015   Procedure: COLONOSCOPY WITH PROPOFOL;  Surgeon: Jerene Bears, MD;  Location: [facility];  Service: Gastroenterology;  Laterality: N/A;  . DEBRIDEMENT LEG Right 03/02/2018   WOUND VAC APPLIED  . DEBRIDEMENT LEG Right 05/27/2018   RIGHT LOWER LEG DEBRIDEMENT,  SKIN GRAFT, VAC PLACEMENT   . DILATION AND CURETTAGE OF UTERUS     multiple  . HEMORRHOID SURGERY    . I & D EXTREMITY Right 03/02/2018   Procedure: RIGHT LEG DEBRIDEMENT AND PLACE VAC;  Surgeon: [name], MD;  Location: [facility];  Service: Orthopedics;  Laterality: Right;  . I & D EXTREMITY Right 03/04/2018   Procedure: REPEAT IRRIGATION AND DEBRIDEMENT RIGHT LEG, PLACE WOUND VAC;  Surgeon: [name], MD;  Location: [facility];  Service: Orthopedics;  Laterality: Right;  . I & D EXTREMITY Right 03/30/2018   Procedure: IRRIGATION AND DEBRIDEMENT RIGHT LEG, APPLY WOUND VAC;  Surgeon: [name], MD;  Location: [facility];  Service: Orthopedics;  Laterality: Right;  . I & D EXTREMITY Right 05/27/2018   Procedure: RIGHT LOWER LEG DEBRIDEMENT,  SKIN GRAFT, VAC PLACEMENT;  Surgeon: [name], MD;  Location: [facility];  Service: Orthopedics;  Laterality: Right;  RIGHT LOWER LEG DEBRIDEMENT,  SKIN GRAFT, VAC PLACEMENT  . renal calculi  12/1997   SVT with induction of anesthesia  . right knee arthroscopy    . SKIN SPLIT GRAFT Right 03/04/2018   Procedure: POSSIBLE SPLIT THICKNESS SKIN GRAFT;  Surgeon: [name], MD;  Location: [facility];  Service: Orthopedics;  Laterality:  Right;  Marland Kitchen SKIN SPLIT GRAFT Right 04/01/2018   Procedure: REPEAT IRRIGATION AND DEBRIDEMENT RIGHT LEG, APPLY SPLIT THICKNESS SKIN GRAFT;  Surgeon: [name], MD;  Location: [facility];  Service:  Orthopedics;  Laterality: Right;  . TONSILLECTOMY AND ADENOIDECTOMY     Social History   Occupational History  . Occupation: Disabled    Employer: RETIRED  Tobacco Use  . Smoking status: Never Smoker  . Smokeless tobacco: Never Used  Vaping Use  . Vaping Use: Never used  Substance and Sexual Activity  . Alcohol use: No    Alcohol/week: 0.0 standard drinks  . Drug use: No  . Sexual activity: Not on file

## 2019-09-05 ENCOUNTER — Telehealth: Payer: Self-pay | Admitting: Orthopedic Surgery

## 2019-09-05 DIAGNOSIS — L03115 Cellulitis of right lower limb: Secondary | ICD-10-CM | POA: Diagnosis not present

## 2019-09-05 DIAGNOSIS — E785 Hyperlipidemia, unspecified: Secondary | ICD-10-CM | POA: Diagnosis not present

## 2019-09-05 DIAGNOSIS — R278 Other lack of coordination: Secondary | ICD-10-CM | POA: Diagnosis not present

## 2019-09-05 NOTE — Telephone Encounter (Signed)
Tammy RN from [facility] called requesting a call back. Patient is not tolerant to ace wrap with Silverdine. Tammy would like to switch patient to compression stockings. Tammy asking Dr. [name] to write script for. Also Tammy is using [employer]ed Vaseline film gel for wound. Wound opened back up. Please call Tammy at [phone]. Can leave VM on secure line

## 2019-09-05 NOTE — Telephone Encounter (Signed)
I called and sw nurse to advise new wound can be addressed at pt's appt and will update orders to continue with the silvadene dressing pt's calf circumference may be too large for compression sock to hold for appt and continue with current treatment

## 2019-09-06 DIAGNOSIS — E785 Hyperlipidemia, unspecified: Secondary | ICD-10-CM | POA: Diagnosis not present

## 2019-09-06 DIAGNOSIS — R278 Other lack of coordination: Secondary | ICD-10-CM | POA: Diagnosis not present

## 2019-09-06 DIAGNOSIS — L03115 Cellulitis of right lower limb: Secondary | ICD-10-CM | POA: Diagnosis not present

## 2019-09-07 DIAGNOSIS — E785 Hyperlipidemia, unspecified: Secondary | ICD-10-CM | POA: Diagnosis not present

## 2019-09-07 DIAGNOSIS — R278 Other lack of coordination: Secondary | ICD-10-CM | POA: Diagnosis not present

## 2019-09-07 DIAGNOSIS — L03115 Cellulitis of right lower limb: Secondary | ICD-10-CM | POA: Diagnosis not present

## 2019-09-08 DIAGNOSIS — L03115 Cellulitis of right lower limb: Secondary | ICD-10-CM | POA: Diagnosis not present

## 2019-09-08 DIAGNOSIS — E785 Hyperlipidemia, unspecified: Secondary | ICD-10-CM | POA: Diagnosis not present

## 2019-09-08 DIAGNOSIS — R278 Other lack of coordination: Secondary | ICD-10-CM | POA: Diagnosis not present

## 2019-09-09 DIAGNOSIS — L03115 Cellulitis of right lower limb: Secondary | ICD-10-CM | POA: Diagnosis not present

## 2019-09-09 DIAGNOSIS — R278 Other lack of coordination: Secondary | ICD-10-CM | POA: Diagnosis not present

## 2019-09-09 DIAGNOSIS — E785 Hyperlipidemia, unspecified: Secondary | ICD-10-CM | POA: Diagnosis not present

## 2019-09-12 DIAGNOSIS — E785 Hyperlipidemia, unspecified: Secondary | ICD-10-CM | POA: Diagnosis not present

## 2019-09-12 DIAGNOSIS — R278 Other lack of coordination: Secondary | ICD-10-CM | POA: Diagnosis not present

## 2019-09-12 DIAGNOSIS — L03115 Cellulitis of right lower limb: Secondary | ICD-10-CM | POA: Diagnosis not present

## 2019-09-13 DIAGNOSIS — E785 Hyperlipidemia, unspecified: Secondary | ICD-10-CM | POA: Diagnosis not present

## 2019-09-13 DIAGNOSIS — L03115 Cellulitis of right lower limb: Secondary | ICD-10-CM | POA: Diagnosis not present

## 2019-09-13 DIAGNOSIS — R278 Other lack of coordination: Secondary | ICD-10-CM | POA: Diagnosis not present

## 2019-09-14 ENCOUNTER — Encounter: Payer: Self-pay | Admitting: Orthopedic Surgery

## 2019-09-14 ENCOUNTER — Ambulatory Visit: Payer: Medicare PPO | Admitting: Orthopedic Surgery

## 2019-09-14 DIAGNOSIS — M25569 Pain in unspecified knee: Secondary | ICD-10-CM | POA: Diagnosis not present

## 2019-09-14 DIAGNOSIS — L03115 Cellulitis of right lower limb: Secondary | ICD-10-CM | POA: Diagnosis not present

## 2019-09-14 DIAGNOSIS — E785 Hyperlipidemia, unspecified: Secondary | ICD-10-CM | POA: Diagnosis not present

## 2019-09-14 DIAGNOSIS — Z945 Skin transplant status: Secondary | ICD-10-CM

## 2019-09-14 DIAGNOSIS — F39 Unspecified mood [affective] disorder: Secondary | ICD-10-CM | POA: Diagnosis not present

## 2019-09-14 DIAGNOSIS — M1711 Unilateral primary osteoarthritis, right knee: Secondary | ICD-10-CM | POA: Diagnosis not present

## 2019-09-14 DIAGNOSIS — R278 Other lack of coordination: Secondary | ICD-10-CM | POA: Diagnosis not present

## 2019-09-14 DIAGNOSIS — M109 Gout, unspecified: Secondary | ICD-10-CM | POA: Diagnosis not present

## 2019-09-14 MED ORDER — METHYLPREDNISOLONE ACETATE 40 MG/ML IJ SUSP
40.0000 mg | INTRAMUSCULAR | Status: AC | PRN
  Administered 2019-09-14: 40 mg via INTRA_ARTICULAR

## 2019-09-14 MED ORDER — LIDOCAINE HCL (PF) 1 % IJ SOLN
5.0000 mL | INTRAMUSCULAR | Status: AC | PRN
  Administered 2019-09-14: 5 mL

## 2019-09-14 NOTE — Progress Notes (Signed)
Office Visit Note   Patient: April [name]           Date of Birth: [birth year]           MRN: [id number] Visit Date: 09/14/2019              Requested by: [name], MD [place],  [place] [address] PCP: [name], MD  Chief Complaint  Patient presents with  . Right Knee - Follow-up  . Right Ankle - Follow-up      HPI: Patient is a 69 year old woman currently resident at skilled nursing ambulates in a wheelchair who has been having increasing pain of the right knee pain with weightbearing pain with transfers.Has undergone a prolonged course of skin grafting for her massive venous ulcerations most recently to the right lower extremity due to the conical nature of the patient's leg anatomy the compression wraps have been rolling down her leg.  Assessment & Plan: Visit Diagnoses:  1. S/P split thickness skin graft   2. Unilateral primary osteoarthritis, right knee     Plan: Patient's right knee was injected she tolerated this well recommended physical therapy for gait training and strengthening for her lower extremities.  With the conical nature of her legs she will not be able to wear compression wraps or compression socks.  Discussed the importance of elevation and exercise.  Follow-Up Instructions: Return if symptoms worsen or fail to improve.   Ortho Exam  Patient is alert, oriented, no adenopathy, well-dressed, normal affect, normal respiratory effort. Examination patient is ambulating in a wheelchair.  Her right knee is tender to palpation over the medial joint line there is no effusion collaterals and cruciates are stable patient has difficulty flexing and extending her knee but the extensor mechanism is intact.  The ulceration to the right lower extremity has completely healed there is minimal swelling halfway up her leg the remainder of the leg is massively swollen.  There is no cellulitis no signs of infection.  Imaging: No results found. No  images are attached to the encounter.  Labs: Lab Results  Component Value Date   HGBA1C 6.0 (H) 09/08/2018   HGBA1C 6.4 07/15/2018   HGBA1C 6.1 (H) 05/27/2018   ESRSEDRATE 42 (H) 08/12/2018   ESRSEDRATE 56 (H) 03/09/2018   ESRSEDRATE 13 06/02/2016   CRP 1.6 (H) 02/04/2019   CRP 1.5 (H) 12/10/2018   CRP 2.1 (H) 12/09/2018   LABURIC 11.1 (H) 02/04/2019   LABURIC 10.5 (H) 09/13/2018   LABURIC 11.0 (H) 09/11/2018   REPTSTATUS 02/17/2019 FINAL 02/15/2019   GRAMSTAIN  05/27/2018    FEW WBC PRESENT, PREDOMINANTLY PMN MODERATE GRAM POSITIVE RODS RARE GRAM POSITIVE COCCI IN PAIRS    CULT (A) 02/15/2019    80,000 COLONIES/mL CITROBACTER FREUNDII 80,000 COLONIES/mL PSEUDOMONAS AERUGINOSA    LABORGA CITROBACTER FREUNDII (A) 02/15/2019   LABORGA PSEUDOMONAS AERUGINOSA (A) 02/15/2019     Lab Results  Component Value Date   ALBUMIN 3.2 (L) 02/23/2019   ALBUMIN 3.0 (L) 12/10/2018   ALBUMIN 3.1 (L) 12/09/2018   LABURIC 11.1 (H) 02/04/2019   LABURIC 10.5 (H) 09/13/2018   LABURIC 11.0 (H) 09/11/2018    Lab Results  Component Value Date   MG 2.2 02/15/2019   MG 2.2 02/14/2019   MG 2.2 02/13/2019   Lab Results  Component Value Date   VD25OH 47.2 06/02/2016   VD25OH 42 01/23/2011    No results found for: PREALBUMIN CBC EXTENDED Latest Ref Rng &  Units 02/27/2019 02/23/2019 02/15/2019  WBC 4.0 - 10.5 K/uL 4.5 5.9 -  RBC 3.87 - 5.11 MIL/uL 3.88 4.05 -  HGB 12.0 - 15.0 g/dL 11.7(L) 12.1 11.7(L)  HCT 36 - 46 % 36.5 38.7 37.0  PLT 150 - 400 K/uL 160 196 -  NEUTROABS 1.7 - 7.7 K/uL 2.3 3.2 -  LYMPHSABS 0.7 - 4.0 K/uL 1.3 1.9 -     There is no height or weight on file to calculate BMI.  Orders:  No orders of the defined types were placed in this encounter.  No orders of the defined types were placed in this encounter.    Procedures: Large Joint Inj: R knee on 09/14/2019 12:14 PM Indications: pain and diagnostic evaluation Details: 22 G 1.5 in needle, anteromedial  approach  Arthrogram: No  Medications: 5 mL lidocaine (PF) 1 %; 40 mg methylPREDNISolone acetate 40 MG/ML Outcome: tolerated well, no immediate complications Procedure, treatment alternatives, risks and benefits explained, specific risks discussed. Consent was given by the patient. Immediately prior to procedure a time out was called to verify the correct patient, procedure, equipment, support staff and site/side marked as required. Patient was prepped and draped in the usual sterile fashion.      Clinical Data: No additional findings.  ROS:  All other systems negative, except as noted in the HPI. Review of Systems  Objective: Vital Signs: There were no vitals taken for this visit.  Specialty Comments:  No specialty comments available.  PMFS History: Patient Active Problem List   Diagnosis Date Noted  . SOB (shortness of breath)   . Acute idiopathic gout of right foot   . Morbid obesity ([place])   . Non-pressure chronic ulcer of left heel and midfoot limited to breakdown of skin ([place])   . Idiopathic chronic gout of multiple sites without tophus   . Cellulitis 02/02/2019  . Pneumonia due to COVID-19 virus 12/10/2018  . COVID-19 virus infection 12/06/2018  . Chronic venous hypertension (idiopathic) with ulcer of right lower extremity (CODE) ([place]) 11/09/2018  . UTI (urinary tract infection) 11/09/2018  . Right hip pain 11/09/2018  . Achilles tendon sprain, left, initial encounter   . Osteomyelitis of ankle or foot, acute, left ([place]) 09/07/2018  . Osteomyelitis of ankle or foot, left, acute ([place]) 09/07/2018  . Acute on chronic kidney failure ([place]) 08/15/2018  . Onychomycosis 05/19/2018  . Idiopathic chronic venous hypertension of right lower extremity with ulcer and inflammation ([place]) 05/19/2018  . Traumatic wound dehiscence   . Skin lumps 01/14/2018  . Bilateral leg cramps 12/14/2017  . Left shoulder pain 08/14/2016  . Meralgia paresthetica 07/16/2016  . Chronic left-sided  low back pain with left-sided sciatica 06/02/2016  . AKI (acute kidney injury) ([place]) 03/28/2016  . Whole body pain 03/20/2016  . Neuropathy 03/20/2016  . Diabetic peripheral neuropathy ([place]) 03/20/2016  . Osteopenia 01/11/2016  . CKD (chronic kidney disease) stage 3, GFR 30-59 ml/min (HCC) 01/02/2016  . Hand paresthesia 07/18/2015  . Carpal tunnel syndrome 06/07/2015  . Family history of colon cancer   . Diabetes mellitus with neurological manifestations ([place]) 04/18/2015  . Bilateral leg edema 04/11/2015  . Abnormality of gait 01/03/2015  . Hereditary and idiopathic peripheral neuropathy 01/03/2015  . GERD (gastroesophageal reflux disease) 06/17/2014  . Hx of colonic polyps 12/15/2012  . Vitamin B12 deficiency 11/03/2012  . Intrinsic asthma 03/23/2012  . Chronic diastolic heart failure ([place]) 02/20/2011  . OSA (obstructive sleep apnea) 09/17/2010  . URINARY URGENCY 01/08/2010  . CAD, NATIVE  VESSEL 11/20/2008  . Osteoarthritis of knees, bilateral 06/14/2008  . Hyperlipidemia 05/10/2007  . Essential hypertension 01/18/2007  . Hypokalemia 04/30/2006  . Morbid (severe) obesity due to excess calories ([place]) 04/30/2006  . HX, PERSONAL, PEPTIC ULCER DISEASE 04/30/2006   Past Medical History:  Diagnosis Date  . Anemia   . Asthma   . CAD (coronary artery disease)   . Carpal tunnel syndrome   . Cellulitis of both lower extremities 04/11/2015  . CHF (congestive heart failure) ([place])   . Chronic kidney disease    CKI- followed by [facility]  . Colon polyp, hyperplastic 2007 & 2012  . Complication of anesthesia 1999   svt with renal calculi surgery, no problems since  . CTS (carpal tunnel syndrome)    bilateral  . Diabetes mellitus   . Eczema   . GERD (gastroesophageal reflux disease)   . History of kidney stones 1999  . Hyperlipidemia   . Hypertension   . Leg ulcer ([place]) 04/24/2015   Right lateral leg No evidence of an infection Monitor closely Keep edema controlled   . Leg  ulcer ([place])    right lower  . Meralgia paresthetica    Dr. [name]  . Morbid obesity ([place])   . Morbid obesity ([place])   . Neuropathy    toes and legs  . Osteoarthrosis, unspecified whether generalized or localized, lower leg    knee  . PUD (peptic ulcer disease)   . Shortness of breath dyspnea    with exertion  . Sleep apnea    per progress note 02/25/2018  . Type II or unspecified type diabetes mellitus without mention of complication, not stated as uncontrolled   . Unspecified hereditary and idiopathic peripheral neuropathy   . Urticaria   . Vitamin B12 deficiency   . Wears glasses   . Wound cellulitis    right upper leg, healing well    Family History  Problem Relation Age of Onset  . Colon cancer Mother   . Prostate cancer Father   . Colon cancer Father   . Diabetes Maternal Aunt   . Breast cancer Maternal Aunt   . Diabetes Maternal Uncle   . Diabetes Paternal Aunt   . Stroke Paternal Aunt        > 65  . Heart disease Paternal Aunt   . Diabetes Paternal Uncle   . Breast cancer Maternal Aunt         X 2  . Breast cancer Cousin     Past Surgical History:  Procedure Laterality Date  . ABDOMINAL HYSTERECTOMY    . BREAST BIOPSY Right 07/08/2018  . CARDIAC CATHETERIZATION  2002   non obstructive disease  . colonoscopy with polypectomy  2007 & 2012    hyperplastic ;Dr Watt Climes  . COLONOSCOPY WITH PROPOFOL N/A 06/04/2015   Procedure: COLONOSCOPY WITH PROPOFOL;  Surgeon: Jerene Bears, MD;  [facility];  Service: Gastroenterology;  Laterality: N/A;  . DEBRIDEMENT LEG Right 03/02/2018   WOUND VAC APPLIED  . DEBRIDEMENT LEG Right 05/27/2018   RIGHT LOWER LEG DEBRIDEMENT,  SKIN GRAFT, VAC PLACEMENT   . DILATION AND CURETTAGE OF UTERUS     multiple  . HEMORRHOID SURGERY    . I & D EXTREMITY Right 03/02/2018   Procedure: RIGHT LEG DEBRIDEMENT AND PLACE VAC;  Surgeon: [name], MD;  Location: [facility];  Service: Orthopedics;  Laterality: Right;  . I & D EXTREMITY Right  03/04/2018   Procedure: REPEAT IRRIGATION AND DEBRIDEMENT  RIGHT LEG, PLACE WOUND VAC;  Surgeon: [name], MD;  Location: [facility];  Service: Orthopedics;  Laterality: Right;  . I & D EXTREMITY Right 03/30/2018   Procedure: IRRIGATION AND DEBRIDEMENT RIGHT LEG, APPLY WOUND VAC;  Surgeon: [name], MD;  Location: [facility];  Service: Orthopedics;  Laterality: Right;  . I & D EXTREMITY Right 05/27/2018   Procedure: RIGHT LOWER LEG DEBRIDEMENT,  SKIN GRAFT, VAC PLACEMENT;  Surgeon: [name], MD;  Location: [facility];  Service: Orthopedics;  Laterality: Right;  RIGHT LOWER LEG DEBRIDEMENT,  SKIN GRAFT, VAC PLACEMENT  . renal calculi  12/1997   SVT with induction of anesthesia  . right knee arthroscopy    . SKIN SPLIT GRAFT Right 03/04/2018   Procedure: POSSIBLE SPLIT THICKNESS SKIN GRAFT;  Surgeon: [name], MD;  Location: [facility];  Service: Orthopedics;  Laterality: Right;  . SKIN SPLIT GRAFT Right 04/01/2018   Procedure: REPEAT IRRIGATION AND DEBRIDEMENT RIGHT LEG, APPLY SPLIT THICKNESS SKIN GRAFT;  Surgeon: [name], MD;  Location: [facility];  Service: Orthopedics;  Laterality: Right;  . TONSILLECTOMY AND ADENOIDECTOMY     Social History   Occupational History  . Occupation: Disabled    Employer: RETIRED  Tobacco Use  . Smoking status: Never Smoker  . Smokeless tobacco: Never Used  Vaping Use  . Vaping Use: Never used  Substance and Sexual Activity  . Alcohol use: No    Alcohol/week: 0.0 standard drinks  . Drug use: No  . Sexual activity: Not on file

## 2019-09-15 DIAGNOSIS — R278 Other lack of coordination: Secondary | ICD-10-CM | POA: Diagnosis not present

## 2019-09-15 DIAGNOSIS — L03115 Cellulitis of right lower limb: Secondary | ICD-10-CM | POA: Diagnosis not present

## 2019-09-15 DIAGNOSIS — E785 Hyperlipidemia, unspecified: Secondary | ICD-10-CM | POA: Diagnosis not present

## 2019-09-16 DIAGNOSIS — E785 Hyperlipidemia, unspecified: Secondary | ICD-10-CM | POA: Diagnosis not present

## 2019-09-16 DIAGNOSIS — R278 Other lack of coordination: Secondary | ICD-10-CM | POA: Diagnosis not present

## 2019-09-16 DIAGNOSIS — L03115 Cellulitis of right lower limb: Secondary | ICD-10-CM | POA: Diagnosis not present

## 2019-09-19 DIAGNOSIS — L03115 Cellulitis of right lower limb: Secondary | ICD-10-CM | POA: Diagnosis not present

## 2019-09-19 DIAGNOSIS — R278 Other lack of coordination: Secondary | ICD-10-CM | POA: Diagnosis not present

## 2019-09-19 DIAGNOSIS — E785 Hyperlipidemia, unspecified: Secondary | ICD-10-CM | POA: Diagnosis not present

## 2019-09-20 DIAGNOSIS — R278 Other lack of coordination: Secondary | ICD-10-CM | POA: Diagnosis not present

## 2019-09-20 DIAGNOSIS — L03115 Cellulitis of right lower limb: Secondary | ICD-10-CM | POA: Diagnosis not present

## 2019-09-20 DIAGNOSIS — E785 Hyperlipidemia, unspecified: Secondary | ICD-10-CM | POA: Diagnosis not present

## 2019-09-21 DIAGNOSIS — R278 Other lack of coordination: Secondary | ICD-10-CM | POA: Diagnosis not present

## 2019-09-21 DIAGNOSIS — E785 Hyperlipidemia, unspecified: Secondary | ICD-10-CM | POA: Diagnosis not present

## 2019-09-21 DIAGNOSIS — L03115 Cellulitis of right lower limb: Secondary | ICD-10-CM | POA: Diagnosis not present

## 2019-09-22 DIAGNOSIS — E785 Hyperlipidemia, unspecified: Secondary | ICD-10-CM | POA: Diagnosis not present

## 2019-09-22 DIAGNOSIS — R278 Other lack of coordination: Secondary | ICD-10-CM | POA: Diagnosis not present

## 2019-09-22 DIAGNOSIS — L03115 Cellulitis of right lower limb: Secondary | ICD-10-CM | POA: Diagnosis not present

## 2019-09-23 DIAGNOSIS — E785 Hyperlipidemia, unspecified: Secondary | ICD-10-CM | POA: Diagnosis not present

## 2019-09-23 DIAGNOSIS — R278 Other lack of coordination: Secondary | ICD-10-CM | POA: Diagnosis not present

## 2019-09-23 DIAGNOSIS — L03115 Cellulitis of right lower limb: Secondary | ICD-10-CM | POA: Diagnosis not present

## 2019-09-25 DIAGNOSIS — H1032 Unspecified acute conjunctivitis, left eye: Secondary | ICD-10-CM | POA: Diagnosis not present

## 2019-09-26 DIAGNOSIS — L03115 Cellulitis of right lower limb: Secondary | ICD-10-CM | POA: Diagnosis not present

## 2019-09-26 DIAGNOSIS — E785 Hyperlipidemia, unspecified: Secondary | ICD-10-CM | POA: Diagnosis not present

## 2019-09-26 DIAGNOSIS — R278 Other lack of coordination: Secondary | ICD-10-CM | POA: Diagnosis not present

## 2019-09-27 ENCOUNTER — Encounter (HOSPITAL_COMMUNITY): Payer: Medicare PPO | Admitting: Internal Medicine

## 2019-09-27 DIAGNOSIS — E785 Hyperlipidemia, unspecified: Secondary | ICD-10-CM | POA: Diagnosis not present

## 2019-09-27 DIAGNOSIS — L03115 Cellulitis of right lower limb: Secondary | ICD-10-CM | POA: Diagnosis not present

## 2019-09-27 DIAGNOSIS — R278 Other lack of coordination: Secondary | ICD-10-CM | POA: Diagnosis not present

## 2019-09-28 DIAGNOSIS — E785 Hyperlipidemia, unspecified: Secondary | ICD-10-CM | POA: Diagnosis not present

## 2019-09-28 DIAGNOSIS — L03115 Cellulitis of right lower limb: Secondary | ICD-10-CM | POA: Diagnosis not present

## 2019-09-28 DIAGNOSIS — R278 Other lack of coordination: Secondary | ICD-10-CM | POA: Diagnosis not present

## 2019-09-29 DIAGNOSIS — R278 Other lack of coordination: Secondary | ICD-10-CM | POA: Diagnosis not present

## 2019-09-29 DIAGNOSIS — L03115 Cellulitis of right lower limb: Secondary | ICD-10-CM | POA: Diagnosis not present

## 2019-09-29 DIAGNOSIS — E785 Hyperlipidemia, unspecified: Secondary | ICD-10-CM | POA: Diagnosis not present

## 2019-09-30 DIAGNOSIS — R278 Other lack of coordination: Secondary | ICD-10-CM | POA: Diagnosis not present

## 2019-09-30 DIAGNOSIS — E785 Hyperlipidemia, unspecified: Secondary | ICD-10-CM | POA: Diagnosis not present

## 2019-09-30 DIAGNOSIS — L03115 Cellulitis of right lower limb: Secondary | ICD-10-CM | POA: Diagnosis not present

## 2019-10-03 DIAGNOSIS — Z20828 Contact with and (suspected) exposure to other viral communicable diseases: Secondary | ICD-10-CM | POA: Diagnosis not present

## 2019-10-03 DIAGNOSIS — R278 Other lack of coordination: Secondary | ICD-10-CM | POA: Diagnosis not present

## 2019-10-03 DIAGNOSIS — Z1383 Encounter for screening for respiratory disorder NEC: Secondary | ICD-10-CM | POA: Diagnosis not present

## 2019-10-03 DIAGNOSIS — L03115 Cellulitis of right lower limb: Secondary | ICD-10-CM | POA: Diagnosis not present

## 2019-10-03 DIAGNOSIS — E785 Hyperlipidemia, unspecified: Secondary | ICD-10-CM | POA: Diagnosis not present

## 2019-10-04 DIAGNOSIS — E785 Hyperlipidemia, unspecified: Secondary | ICD-10-CM | POA: Diagnosis not present

## 2019-10-04 DIAGNOSIS — R278 Other lack of coordination: Secondary | ICD-10-CM | POA: Diagnosis not present

## 2019-10-04 DIAGNOSIS — L03115 Cellulitis of right lower limb: Secondary | ICD-10-CM | POA: Diagnosis not present

## 2019-10-05 DIAGNOSIS — E785 Hyperlipidemia, unspecified: Secondary | ICD-10-CM | POA: Diagnosis not present

## 2019-10-05 DIAGNOSIS — L03115 Cellulitis of right lower limb: Secondary | ICD-10-CM | POA: Diagnosis not present

## 2019-10-05 DIAGNOSIS — R278 Other lack of coordination: Secondary | ICD-10-CM | POA: Diagnosis not present

## 2019-10-06 DIAGNOSIS — R278 Other lack of coordination: Secondary | ICD-10-CM | POA: Diagnosis not present

## 2019-10-06 DIAGNOSIS — E785 Hyperlipidemia, unspecified: Secondary | ICD-10-CM | POA: Diagnosis not present

## 2019-10-06 DIAGNOSIS — L03115 Cellulitis of right lower limb: Secondary | ICD-10-CM | POA: Diagnosis not present

## 2019-10-07 DIAGNOSIS — R278 Other lack of coordination: Secondary | ICD-10-CM | POA: Diagnosis not present

## 2019-10-07 DIAGNOSIS — E785 Hyperlipidemia, unspecified: Secondary | ICD-10-CM | POA: Diagnosis not present

## 2019-10-07 DIAGNOSIS — L03115 Cellulitis of right lower limb: Secondary | ICD-10-CM | POA: Diagnosis not present

## 2019-10-10 DIAGNOSIS — L03115 Cellulitis of right lower limb: Secondary | ICD-10-CM | POA: Diagnosis not present

## 2019-10-10 DIAGNOSIS — R278 Other lack of coordination: Secondary | ICD-10-CM | POA: Diagnosis not present

## 2019-10-10 DIAGNOSIS — E785 Hyperlipidemia, unspecified: Secondary | ICD-10-CM | POA: Diagnosis not present

## 2019-10-11 DIAGNOSIS — E785 Hyperlipidemia, unspecified: Secondary | ICD-10-CM | POA: Diagnosis not present

## 2019-10-11 DIAGNOSIS — R278 Other lack of coordination: Secondary | ICD-10-CM | POA: Diagnosis not present

## 2019-10-11 DIAGNOSIS — L03115 Cellulitis of right lower limb: Secondary | ICD-10-CM | POA: Diagnosis not present

## 2019-10-12 ENCOUNTER — Telehealth: Payer: Self-pay | Admitting: Internal Medicine

## 2019-10-12 DIAGNOSIS — F39 Unspecified mood [affective] disorder: Secondary | ICD-10-CM | POA: Diagnosis not present

## 2019-10-12 DIAGNOSIS — M25569 Pain in unspecified knee: Secondary | ICD-10-CM | POA: Diagnosis not present

## 2019-10-12 DIAGNOSIS — M109 Gout, unspecified: Secondary | ICD-10-CM | POA: Diagnosis not present

## 2019-10-12 NOTE — Telephone Encounter (Signed)
Patient's husband Theatre manager) Dropped off renewal FMLA.  Forms have been completed &Placed in providers box to review and sign.

## 2019-10-13 DIAGNOSIS — R278 Other lack of coordination: Secondary | ICD-10-CM | POA: Diagnosis not present

## 2019-10-13 DIAGNOSIS — E785 Hyperlipidemia, unspecified: Secondary | ICD-10-CM | POA: Diagnosis not present

## 2019-10-13 DIAGNOSIS — L03115 Cellulitis of right lower limb: Secondary | ICD-10-CM | POA: Diagnosis not present

## 2019-10-13 NOTE — Telephone Encounter (Signed)
Forms have been signed, Faxed to [phone], Copy sent to scan &Charged for under [facility].  Called to inform, but VM box is full. Please let Patient/Husband know forms are ready to be picked up.

## 2019-10-14 DIAGNOSIS — E785 Hyperlipidemia, unspecified: Secondary | ICD-10-CM | POA: Diagnosis not present

## 2019-10-14 DIAGNOSIS — L03115 Cellulitis of right lower limb: Secondary | ICD-10-CM | POA: Diagnosis not present

## 2019-10-14 DIAGNOSIS — R278 Other lack of coordination: Secondary | ICD-10-CM | POA: Diagnosis not present

## 2019-10-16 DIAGNOSIS — L03115 Cellulitis of right lower limb: Secondary | ICD-10-CM | POA: Diagnosis not present

## 2019-10-16 DIAGNOSIS — R278 Other lack of coordination: Secondary | ICD-10-CM | POA: Diagnosis not present

## 2019-10-16 DIAGNOSIS — E785 Hyperlipidemia, unspecified: Secondary | ICD-10-CM | POA: Diagnosis not present

## 2019-10-17 DIAGNOSIS — L03115 Cellulitis of right lower limb: Secondary | ICD-10-CM | POA: Diagnosis not present

## 2019-10-17 DIAGNOSIS — R278 Other lack of coordination: Secondary | ICD-10-CM | POA: Diagnosis not present

## 2019-10-17 DIAGNOSIS — E785 Hyperlipidemia, unspecified: Secondary | ICD-10-CM | POA: Diagnosis not present

## 2019-10-18 DIAGNOSIS — R278 Other lack of coordination: Secondary | ICD-10-CM | POA: Diagnosis not present

## 2019-10-18 DIAGNOSIS — L03115 Cellulitis of right lower limb: Secondary | ICD-10-CM | POA: Diagnosis not present

## 2019-10-18 DIAGNOSIS — E785 Hyperlipidemia, unspecified: Secondary | ICD-10-CM | POA: Diagnosis not present

## 2019-10-19 DIAGNOSIS — L03115 Cellulitis of right lower limb: Secondary | ICD-10-CM | POA: Diagnosis not present

## 2019-10-19 DIAGNOSIS — E785 Hyperlipidemia, unspecified: Secondary | ICD-10-CM | POA: Diagnosis not present

## 2019-10-19 DIAGNOSIS — R278 Other lack of coordination: Secondary | ICD-10-CM | POA: Diagnosis not present

## 2019-10-19 NOTE — Progress Notes (Signed)
ADVANCED HF CLINIC CONSULT NOTE  Referring Physician:Burns, Claudina Lick, MD Primary Care: [name], MD  HPI:  April [name] is a 69 year old woman with history of morbid obesity (BMI 70 -> 58), DM2, hypertension, hyperlipidemia, and mild-to- moderate coronary artery disease.  She underwent catheterization in February 2008, which showed 50-60% LAD lesion and 30% RCA lesion.  EF was 70%.  She had mild pulmonary hypertension, primarily due to high output state.  Her mean pulmonary artery pressure was 27, wedge pressure was 10.  PVR was 1.80 Woods units.  We last saw her in 2015 and is referred back now by Dr. Quay Burow for SOB.   She fell in 1/20 and broke her R leg and had multiple surgeries. Now at [facility].   Says her breathing feels alright. Says she has asthma and gets much worse as weather changes. Now able to stand and take little steps. No CP, orthopnea or PND. Takes 80 mg lasix bid and feels fluid is well controlled.   04/2015 ECHO EF 65-70% RV ok 09/2010 ECHO EF 60-65% RV normal    Review of Systems: [y] = yes, [ ]  = no   General: Weight gain [ ] ; Weight loss [ ] ; Anorexia [ ] ; Fatigue Blue.Reese ]; Fever [ ] ; Chills [ ] ; Weakness [ y]  Cardiac: Chest pain/pressure [ ] ; Resting SOB Blue.Reese ]; Exertional SOB Blue.Reese ]; Orthopnea [ ] ; Pedal Edema Blue.Reese ]; Palpitations [ ] ; Syncope [ ] ; Presyncope [ ] ; Paroxysmal nocturnal dyspnea[ ]   Pulmonary: Cough [ ] ; Wheezing[ y]; Hemoptysis[ ] ; Sputum [ ] ; Snoring [ ]   GI: Vomiting[ ] ; Dysphagia[ ] ; Melena[ ] ; Hematochezia [ ] ; Heartburn[ ] ; Abdominal pain [ ] ; Constipation [ ] ; Diarrhea [ ] ; BRBPR [ ]   GU: Hematuria[ ] ; Dysuria [ ] ; Nocturia[ ]   Vascular: Pain in legs with walking [ ] ; Pain in feet with lying flat [ ] ; Non-healing sores [ ] ; Stroke [ ] ; TIA [ ] ; Slurred speech [ ] ;  Neuro: Headaches[ ] ; Vertigo[ ] ; Seizures[ ] ; Paresthesias[ ] ;Blurred vision [ ] ; Diplopia [ ] ; Vision changes [ ]   Ortho/Skin: Arthritis Blue.Reese ]; Joint pain [ y]; Muscle  pain [ ] ; Joint swelling [ ] ; Back Pain [ ] ; Rash [ ]   Psych: Depression[ ] ; Anxiety[ ]   Heme: Bleeding problems [ ] ; Clotting disorders [ ] ; Anemia [ ]   Endocrine: Diabetes [ y]; Thyroid dysfunction[ ]    Past Medical History:  Diagnosis Date   Anemia    Asthma    CAD (coronary artery disease)    Carpal tunnel syndrome    Cellulitis of both lower extremities 04/11/2015   CHF (congestive heart failure) ([place])    Chronic kidney disease    [facility]- followed by [facility]   Colon polyp, hyperplastic 1610 & 9604   Complication of anesthesia 1999   svt with renal calculi surgery, no problems since   CTS (carpal tunnel syndrome)    bilateral   Diabetes mellitus    Eczema    GERD (gastroesophageal reflux disease)    History of kidney stones 1999   Hyperlipidemia    Hypertension    Leg ulcer ([place]) 04/24/2015   Right lateral leg No evidence of an infection Monitor closely Keep edema controlled    Leg ulcer ([facility])    right lower   Meralgia paresthetica    Dr. [name]   Morbid obesity [facility])    Morbid obesity ([place])    Neuropathy    toes and legs  Osteoarthrosis, unspecified whether generalized or localized, lower leg    knee   PUD (peptic ulcer disease)    Shortness of breath dyspnea    with exertion   Sleep apnea    per progress note 02/25/2018   Type II or unspecified type diabetes mellitus without mention of complication, not stated as uncontrolled    Unspecified hereditary and idiopathic peripheral neuropathy    Urticaria    Vitamin B12 deficiency    Wears glasses    Wound cellulitis    right upper leg, healing well    Current Outpatient Medications  Medication Sig Dispense Refill   acetaminophen (TYLENOL) 325 MG tablet Take 2 tablets (650 mg total) by mouth every 6 (six) hours as needed for moderate pain (or Fever >/= 101). 20 tablet 0   allopurinol (ZYLOPRIM) 100 MG tablet Take 1 tablet (100 mg total) by mouth daily. 90 tablet 1   aspirin  (ECOTRIN LOW STRENGTH) 81 MG EC tablet Take 81 mg by mouth at bedtime.      Budesonide (PULMICORT FLEXHALER) 90 MCG/ACT inhaler Inhale 2 puffs into the lungs 2 (two) times daily.     budesonide-formoterol (SYMBICORT) 160-4.5 MCG/ACT inhaler one - two inhalations every 12 hours; gargle and spit after use (Patient taking differently: Inhale 2 puffs into the lungs 2 (two) times daily. gargle and spit after use) 1 Inhaler 4   calcium carbonate (TUMS - DOSED IN MG ELEMENTAL CALCIUM) 500 MG chewable tablet Chew 2 tablets by mouth daily as needed for indigestion or heartburn.     carvedilol (COREG) 12.5 MG tablet TAKE 1 TABLET(12.5 MG) BY MOUTH TWICE DAILY WITH A MEAL 180 tablet 0   cholecalciferol (VITAMIN D3) 25 MCG (1000 UT) tablet Take 1,000 Units by mouth daily.     COD LIVER OIL PO Take 1 capsule by mouth daily.     colchicine 0.6 MG tablet Take 0.5 tablets (0.3 mg total) by mouth daily. 90 tablet 1   cyanocobalamin (,VITAMIN B-12,) 1000 MCG/ML injection INJECT 1ML INTO MUSCLE EVERY 30 DAYS (Patient taking differently: Inject 1,000 mcg into the muscle every 30 (thirty) days. ) 10 mL 0   diclofenac sodium (VOLTAREN) 1 % GEL Apply 2 g topically 4 (four) times daily. 100 g 11   estradiol (ESTRACE) 0.1 MG/GM vaginal cream Place 1 Applicatorful vaginally at bedtime. 42.5 g 12   famotidine (PEPCID) 20 MG tablet Take 1 tablet (20 mg total) by mouth 2 (two) times daily. 180 tablet 3   ferrous sulfate 325 (65 FE) MG tablet Take 1 tablet (325 mg total) by mouth every Monday, Wednesday, and Friday. 60 tablet 0   folic acid (FOLVITE) 124 MCG tablet Take 400 mcg by mouth at bedtime.      furosemide (LASIX) 80 MG tablet TAKE 1 TABLET(80 MG) BY MOUTH TWICE DAILY 180 tablet 0   gabapentin (NEURONTIN) 100 MG capsule Take 1 capsule (100 mg total) by mouth 3 (three) times daily. 90 capsule 0   guaiFENesin-dextromethorphan (ROBITUSSIN DM) 100-10 MG/5ML syrup Take 5 mLs by mouth every 6 (six) hours as  needed for cough. 118 mL 0   hydrocortisone (ANUSOL-HC) 2.5 % rectal cream Place 1 application rectally 2 (two) times daily. 30 g 0   hydrOXYzine (ATARAX/VISTARIL) 25 MG tablet Take 1 tablet (25 mg total) by mouth 3 (three) times daily as needed for itching. 30 tablet 0   lactobacillus acidophilus (BACID) TABS tablet Take 2 tablets by mouth 3 (three) times daily. 30 tablet 0  montelukast (SINGULAIR) 10 MG tablet Take 1 tablet (10 mg total) by mouth at bedtime. 30 tablet 5   Oxcarbazepine (TRILEPTAL) 300 MG tablet Take 1 tablet (300 mg total) by mouth 2 (two) times daily. 60 tablet 0   oxyCODONE (OXY IR/ROXICODONE) 5 MG immediate release tablet Take 1 tablet (5 mg total) by mouth every 6 (six) hours as needed for moderate pain. 5 tablet 0   polyethylene glycol (MIRALAX / GLYCOLAX) 17 g packet Take 17 g by mouth daily. 14 each 0   potassium chloride SA (KLOR-CON) 20 MEQ tablet Take 20 mEq by mouth 3 (three) times daily.     senna-docusate (SENOKOT-S) 8.6-50 MG tablet Take 1 tablet by mouth at bedtime as needed for mild constipation. 30 tablet 0   silver sulfADIAZINE (SILVADENE) 1 % cream Apply 1 application topically daily. 400 g 0   simvastatin (ZOCOR) 40 MG tablet Take 1 tablet (40 mg total) by mouth at bedtime. 90 tablet 1   spironolactone (ALDACTONE) 25 MG tablet Take 1 tablet (25 mg total) by mouth daily. 90 tablet 0   No current facility-administered medications for this encounter.    Allergies  Allergen Reactions   Penicillins Rash and Other (See Comments)    She was told not to take it anymore. Has patient had a PCN reaction causing immediate rash, facial/tongue/throat swelling, SOB or lightheadedness with hypotension: Yes Has patient had a PCN reaction causing severe rash involving mucus membranes or skin necrosis: No Has patient had a PCN reaction that required hospitalization: No Has patient had a PCN reaction occurring within the last 10 years: No If all of the above  answers are "NO", then may proceed with Cephalosporin use.'   Shellfish Allergy Anaphylaxis   Sulfonamide Derivatives Anaphylaxis and Other (See Comments)   Hydrochlorothiazide W-Triamterene Other (See Comments)    Hypokalemia   Lotensin [Benazepril Hcl] Hives   Dipyridamole Other (See Comments)    Unknown reaction   Estrogens Other (See Comments)    Unknown reaction   Hydrochlorothiazide Other (See Comments)    Unknown reaction   Metronidazole Other (See Comments)    Unknown reaction   Other Itching    PICKLES   Spironolactone Other (See Comments)    UNSPECIFIED > "kidney problems"   Sulfa Antibiotics Other (See Comments)    Unknown reaction   Torsemide Other (See Comments)    Unknown reaction   Valsartan Other (See Comments)    Unknown reaction   Mustard [Allyl Isothiocyanate] Itching      Social History   Socioeconomic History   Marital status: Married    Spouse name: Dwight   Number of children: 1   Years of education: BS   Highest education level: Not on file  Occupational History   Occupation: Disabled    Employer: RETIRED  Tobacco Use   Smoking status: Never Smoker   Smokeless tobacco: Never Used  Scientific laboratory technician Use: Never used  Substance and Sexual Activity   Alcohol use: No    Alcohol/week: 0.0 standard drinks   Drug use: No   Sexual activity: Not on file  Other Topics Concern   Not on file  Social History Narrative   Patient lives at home with her husband [name] Patient is retired and has a Conservation officer, nature.    Caffeine - some times.   Right handed.   Social Determinants of Health   Financial Resource Strain:    Difficulty of Paying Living Expenses: Not on  file  Food Insecurity:    Worried About Charity fundraiser in the Last Year: Not on file   [employer] of Food in the Last Year: Not on file  Transportation Needs:    Lack of Transportation (Medical): Not on file   Lack of Transportation  (Non-Medical): Not on file  Physical Activity:    Days of Exercise per Week: Not on file   Minutes of Exercise per Session: Not on file  Stress:    Feeling of Stress : Not on file  Social Connections:    Frequency of Communication with Friends and Family: Not on file   Frequency of Social Gatherings with Friends and Family: Not on file   Attends Religious Services: Not on file   Active Member of Clubs or Organizations: Not on file   Attends Archivist Meetings: Not on file   Marital Status: Not on file  Intimate Partner Violence:    Fear of Current or Ex-Partner: Not on file   Emotionally Abused: Not on file   Physically Abused: Not on file   Sexually Abused: Not on file      Family History  Problem Relation Age of Onset   Colon cancer Mother    Prostate cancer Father    Colon cancer Father    Diabetes Maternal Aunt    Breast cancer Maternal Aunt    Diabetes Maternal Uncle    Diabetes Paternal Aunt    Stroke Paternal Aunt        > 12   Heart disease Paternal Aunt    Diabetes Paternal Uncle    Breast cancer Maternal Aunt         X 2   Breast cancer Cousin     Vitals:   10/20/19 1136  BP: (!) 142/80  Pulse: 72  SpO2: 92%  Weight: (!) 154.2 kg (340 lb)  Height: 5\' 4"  (1.626 m)   Body mass index is 58.36 kg/m.   PHYSICAL EXAM: General:  Markedly obese woman in WC HEENT: normal Neck: supple. no JVD. Carotids 2+ bilat; no bruits. No lymphadenopathy or thryomegaly appreciated. Cor: PMI nondisplaced. Regular rate & rhythm. No rubs, gallops or murmurs. Lungs: clear Abdomen: marked central obesity soft, nontender, nondistended. No hepatosplenomegaly. No bruits or masses. Good bowel sounds. Extremities: no cyanosis, clubbing, rash, healed wound on RLE Neuro: alert & oriented x 3, cranial nerves grossly intact. moves all 4 extremities w/o difficulty. Affect pleasant.  ECG: NSR 79 No ST-T wave abnormalities. Personally  reviewed   ASSESSMENT & PLAN:  1. Chronic diastolic HF - previous echo with normal biventricular function - volume status well controlled on exam - will repeat echo to ensure stability of EF  2. Severe Obesity - this is her main issue - drastically needs weight loss - discussed [employer]  3. CAD - non-obstructive - no s/s angina - continue ASA/statin  4. HTN - BP mildly elevated - can add losartan or amlodipine as needed   Glori Bickers, MD  10:17 PM

## 2019-10-20 ENCOUNTER — Ambulatory Visit (HOSPITAL_COMMUNITY)
Admission: RE | Admit: 2019-10-20 | Discharge: 2019-10-20 | Disposition: A | Discharge status: Home / Self Care | Payer: Medicare PPO | Source: Ambulatory Visit | Attending: Internal Medicine | Admitting: Internal Medicine

## 2019-10-20 ENCOUNTER — Other Ambulatory Visit: Payer: Self-pay

## 2019-10-20 ENCOUNTER — Encounter (HOSPITAL_COMMUNITY): Payer: Self-pay | Admitting: Internal Medicine

## 2019-10-20 VITALS — BP 142/80 | HR 72 | Ht 64.0 in | Wt 340.0 lb

## 2019-10-20 DIAGNOSIS — I10 Essential (primary) hypertension: Secondary | ICD-10-CM | POA: Diagnosis not present

## 2019-10-20 DIAGNOSIS — E1122 Type 2 diabetes mellitus with diabetic chronic kidney disease: Secondary | ICD-10-CM | POA: Insufficient documentation

## 2019-10-20 DIAGNOSIS — Z880 Allergy status to penicillin: Secondary | ICD-10-CM | POA: Insufficient documentation

## 2019-10-20 DIAGNOSIS — Z8711 Personal history of peptic ulcer disease: Secondary | ICD-10-CM | POA: Diagnosis not present

## 2019-10-20 DIAGNOSIS — Z7951 Long term (current) use of inhaled steroids: Secondary | ICD-10-CM | POA: Insufficient documentation

## 2019-10-20 DIAGNOSIS — G4730 Sleep apnea, unspecified: Secondary | ICD-10-CM | POA: Diagnosis not present

## 2019-10-20 DIAGNOSIS — Z79899 Other long term (current) drug therapy: Secondary | ICD-10-CM | POA: Diagnosis not present

## 2019-10-20 DIAGNOSIS — R278 Other lack of coordination: Secondary | ICD-10-CM | POA: Diagnosis not present

## 2019-10-20 DIAGNOSIS — Z833 Family history of diabetes mellitus: Secondary | ICD-10-CM | POA: Insufficient documentation

## 2019-10-20 DIAGNOSIS — Z6841 Body Mass Index (BMI) 40.0 and over, adult: Secondary | ICD-10-CM | POA: Diagnosis not present

## 2019-10-20 DIAGNOSIS — Z882 Allergy status to sulfonamides status: Secondary | ICD-10-CM | POA: Insufficient documentation

## 2019-10-20 DIAGNOSIS — N189 Chronic kidney disease, unspecified: Secondary | ICD-10-CM | POA: Diagnosis not present

## 2019-10-20 DIAGNOSIS — Z888 Allergy status to other drugs, medicaments and biological substances status: Secondary | ICD-10-CM | POA: Insufficient documentation

## 2019-10-20 DIAGNOSIS — Z881 Allergy status to other antibiotic agents status: Secondary | ICD-10-CM | POA: Diagnosis not present

## 2019-10-20 DIAGNOSIS — J45909 Unspecified asthma, uncomplicated: Secondary | ICD-10-CM | POA: Insufficient documentation

## 2019-10-20 DIAGNOSIS — Z791 Long term (current) use of non-steroidal anti-inflammatories (NSAID): Secondary | ICD-10-CM | POA: Diagnosis not present

## 2019-10-20 DIAGNOSIS — I2510 Atherosclerotic heart disease of native coronary artery without angina pectoris: Secondary | ICD-10-CM | POA: Diagnosis not present

## 2019-10-20 DIAGNOSIS — Z8249 Family history of ischemic heart disease and other diseases of the circulatory system: Secondary | ICD-10-CM | POA: Diagnosis not present

## 2019-10-20 DIAGNOSIS — Z7982 Long term (current) use of aspirin: Secondary | ICD-10-CM | POA: Diagnosis not present

## 2019-10-20 DIAGNOSIS — L03115 Cellulitis of right lower limb: Secondary | ICD-10-CM | POA: Diagnosis not present

## 2019-10-20 DIAGNOSIS — K219 Gastro-esophageal reflux disease without esophagitis: Secondary | ICD-10-CM | POA: Insufficient documentation

## 2019-10-20 DIAGNOSIS — I130 Hypertensive heart and chronic kidney disease with heart failure and stage 1 through stage 4 chronic kidney disease, or unspecified chronic kidney disease: Secondary | ICD-10-CM | POA: Diagnosis not present

## 2019-10-20 DIAGNOSIS — I5032 Chronic diastolic (congestive) heart failure: Secondary | ICD-10-CM | POA: Diagnosis not present

## 2019-10-20 DIAGNOSIS — E785 Hyperlipidemia, unspecified: Secondary | ICD-10-CM | POA: Diagnosis not present

## 2019-10-20 LAB — BRAIN NATRIURETIC PEPTIDE: B Natriuretic Peptide: 51.9 pg/mL (ref 0.0–100.0)

## 2019-10-20 NOTE — Patient Instructions (Addendum)
Following orders sent back to facility:   EKG checked  Echocardiogram recommended PLEASE CALL us TO SCHEDULE AT [phone]  Labs done today, your results will be available in MyChart, we will contact you for abnormal readings.  Please call our office in 1 year to schedule your follow up appointment  If you have any questions or concerns before your next appointment please send [place] a message through [place] or call our office at [phone].    TO LEAVE A MESSAGE FOR THE NURSE SELECT OPTION 2, PLEASE LEAVE A MESSAGE INCLUDING: . YOUR NAME . DATE OF BIRTH . CALL BACK NUMBER . REASON FOR CALL**this is important as we prioritize the call backs  [place] AS LONG AS YOU CALL BEFORE 4:00 PM  At the [facility], you and your health needs are our priority. As part of our continuing mission to provide you with exceptional heart care, we have created designated Provider Care Teams. These Care Teams include your primary Cardiologist (physician) and Advanced Practice Providers (APPs- Physician Assistants and Nurse Practitioners) who all work together to provide you with the care you need, when you need it.   You may see any of the following providers on your designated Care Team at your next follow up: Marland Kitchen Dr Glori Bickers . Dr Loralie Champagne . Darrick Grinder, NP . Lyda Jester, PA . Audry Riles, PharmD   Please be sure to bring in all your medications bottles to every appointment.

## 2019-10-23 DIAGNOSIS — R278 Other lack of coordination: Secondary | ICD-10-CM | POA: Diagnosis not present

## 2019-10-23 DIAGNOSIS — L03115 Cellulitis of right lower limb: Secondary | ICD-10-CM | POA: Diagnosis not present

## 2019-10-23 DIAGNOSIS — E785 Hyperlipidemia, unspecified: Secondary | ICD-10-CM | POA: Diagnosis not present

## 2019-10-24 ENCOUNTER — Telehealth: Payer: Self-pay | Admitting: Physician Assistant

## 2019-10-24 DIAGNOSIS — R278 Other lack of coordination: Secondary | ICD-10-CM | POA: Diagnosis not present

## 2019-10-24 DIAGNOSIS — Z20828 Contact with and (suspected) exposure to other viral communicable diseases: Secondary | ICD-10-CM | POA: Diagnosis not present

## 2019-10-24 DIAGNOSIS — L03115 Cellulitis of right lower limb: Secondary | ICD-10-CM | POA: Diagnosis not present

## 2019-10-24 DIAGNOSIS — Z1383 Encounter for screening for respiratory disorder NEC: Secondary | ICD-10-CM | POA: Diagnosis not present

## 2019-10-24 DIAGNOSIS — E785 Hyperlipidemia, unspecified: Secondary | ICD-10-CM | POA: Diagnosis not present

## 2019-10-24 NOTE — Telephone Encounter (Signed)
I connected by phone with April [name] and/or patient's caregiver on 10/24/2019 at 6:35 PM to discuss the potential vaccination through our Homebound vaccination initiative.   Prevaccination Checklist for COVID-19 Vaccines  1.  Are you feeling sick today? no  2.  Have you ever received a dose of a COVID-19 vaccine?  yes      If yes, which one? Moderna   3.  Have you ever had an allergic reaction: (This would include a severe reaction [ e.g., anaphylaxis] that required treatment with epinephrine or EpiPen or that caused you to go to the hospital.  It would also include an allergic reaction that occurred within 4 hours that caused hives, swelling, or respiratory distress, including wheezing.) A.  A previous dose of COVID-19 vaccine. no  B.  A vaccine or injectable therapy that contains multiple components, one of which is a COVID-19 vaccine component, but it is not known which component elicited the immediate reaction. no  C.  Are you allergic to polyethylene glycol? no  D. Are you allergic to Polysorbate, which is found in some vaccines, film coated tablets and intravenous steroids?  no   4.  Have you ever had an allergic reaction to another vaccine (other than COVID-19 vaccine) or an injectable medication? (This would include a severe reaction [ e.g., anaphylaxis] that required treatment with epinephrine or EpiPen or that caused you to go to the hospital.  It would also include an allergic reaction that occurred within 4 hours that caused hives, swelling, or respiratory distress, including wheezing.)  no   5.  Have you ever had a severe allergic reaction (e.g., anaphylaxis) to something other than a component of the COVID-19 vaccine, or any vaccine or injectable medication?  This would include food, pet, venom, environmental, or oral medication allergies.  yes  Shellfish and sulfa drugs  6.  Have you received any vaccine in the last 14 days? no   7.  Have you ever had a positive test for  COVID-19 or has a doctor ever told you that you had COVID-19?  yes admitted in 12/2018 for covid pNA  8.  Have you received passive antibody therapy (monoclonal antibodies or convalescent serum) as a treatment for COVID-19? no   9.  Do you have a weakened immune system caused by something such as HIV infection or cancer or do you take immunosuppressive drugs or therapies?  no   10.  Do you have a bleeding disorder or are you taking a blood thinner? Yes, aspirin   11.  Are you pregnant or breast-feeding? no   12.  Do you have dermal fillers? no   __________________   This patient is a 69 y.o. female that meets the FDA criteria to receive homebound vaccination. Patient or parent/caregiver understands they have the option to accept or refuse homebound vaccination.  Patient passed the pre-screening checklist and would like to proceed with homebound vaccination.  Based on questionnaire above, I recommend the patient be observed for 30 minutes.  There are no other household members/caregivers who are also interested in receiving the vaccine.   I will send the patient's information to our scheduling team who will reach out to schedule the patient and potential caregiver/family members for homebound vaccination.    April [name] 10/24/2019 6:35 PM

## 2019-10-25 DIAGNOSIS — L03115 Cellulitis of right lower limb: Secondary | ICD-10-CM | POA: Diagnosis not present

## 2019-10-25 DIAGNOSIS — R278 Other lack of coordination: Secondary | ICD-10-CM | POA: Diagnosis not present

## 2019-10-25 DIAGNOSIS — E785 Hyperlipidemia, unspecified: Secondary | ICD-10-CM | POA: Diagnosis not present

## 2019-10-26 DIAGNOSIS — R278 Other lack of coordination: Secondary | ICD-10-CM | POA: Diagnosis not present

## 2019-10-26 DIAGNOSIS — E785 Hyperlipidemia, unspecified: Secondary | ICD-10-CM | POA: Diagnosis not present

## 2019-10-26 DIAGNOSIS — L03115 Cellulitis of right lower limb: Secondary | ICD-10-CM | POA: Diagnosis not present

## 2019-10-26 DIAGNOSIS — M19072 Primary osteoarthritis, left ankle and foot: Secondary | ICD-10-CM | POA: Diagnosis not present

## 2019-10-26 DIAGNOSIS — M7732 Calcaneal spur, left foot: Secondary | ICD-10-CM | POA: Diagnosis not present

## 2019-10-26 DIAGNOSIS — M25572 Pain in left ankle and joints of left foot: Secondary | ICD-10-CM | POA: Diagnosis not present

## 2019-10-27 DIAGNOSIS — E785 Hyperlipidemia, unspecified: Secondary | ICD-10-CM | POA: Diagnosis not present

## 2019-10-27 DIAGNOSIS — H2513 Age-related nuclear cataract, bilateral: Secondary | ICD-10-CM | POA: Diagnosis not present

## 2019-10-27 DIAGNOSIS — L03115 Cellulitis of right lower limb: Secondary | ICD-10-CM | POA: Diagnosis not present

## 2019-10-27 DIAGNOSIS — E119 Type 2 diabetes mellitus without complications: Secondary | ICD-10-CM | POA: Diagnosis not present

## 2019-10-27 DIAGNOSIS — R278 Other lack of coordination: Secondary | ICD-10-CM | POA: Diagnosis not present

## 2019-10-30 DIAGNOSIS — R278 Other lack of coordination: Secondary | ICD-10-CM | POA: Diagnosis not present

## 2019-10-30 DIAGNOSIS — L03115 Cellulitis of right lower limb: Secondary | ICD-10-CM | POA: Diagnosis not present

## 2019-10-30 DIAGNOSIS — E785 Hyperlipidemia, unspecified: Secondary | ICD-10-CM | POA: Diagnosis not present

## 2019-10-30 DIAGNOSIS — Z20822 Contact with and (suspected) exposure to covid-19: Secondary | ICD-10-CM | POA: Diagnosis not present

## 2019-10-31 DIAGNOSIS — L03115 Cellulitis of right lower limb: Secondary | ICD-10-CM | POA: Diagnosis not present

## 2019-10-31 DIAGNOSIS — R278 Other lack of coordination: Secondary | ICD-10-CM | POA: Diagnosis not present

## 2019-10-31 DIAGNOSIS — E785 Hyperlipidemia, unspecified: Secondary | ICD-10-CM | POA: Diagnosis not present

## 2019-11-01 DIAGNOSIS — L03115 Cellulitis of right lower limb: Secondary | ICD-10-CM | POA: Diagnosis not present

## 2019-11-01 DIAGNOSIS — E785 Hyperlipidemia, unspecified: Secondary | ICD-10-CM | POA: Diagnosis not present

## 2019-11-01 DIAGNOSIS — R278 Other lack of coordination: Secondary | ICD-10-CM | POA: Diagnosis not present

## 2019-11-02 DIAGNOSIS — E785 Hyperlipidemia, unspecified: Secondary | ICD-10-CM | POA: Diagnosis not present

## 2019-11-02 DIAGNOSIS — L03115 Cellulitis of right lower limb: Secondary | ICD-10-CM | POA: Diagnosis not present

## 2019-11-02 DIAGNOSIS — R278 Other lack of coordination: Secondary | ICD-10-CM | POA: Diagnosis not present

## 2019-11-03 DIAGNOSIS — R278 Other lack of coordination: Secondary | ICD-10-CM | POA: Diagnosis not present

## 2019-11-03 DIAGNOSIS — L03115 Cellulitis of right lower limb: Secondary | ICD-10-CM | POA: Diagnosis not present

## 2019-11-03 DIAGNOSIS — Z23 Encounter for immunization: Secondary | ICD-10-CM | POA: Diagnosis not present

## 2019-11-03 DIAGNOSIS — E785 Hyperlipidemia, unspecified: Secondary | ICD-10-CM | POA: Diagnosis not present

## 2019-11-06 DIAGNOSIS — Z20822 Contact with and (suspected) exposure to covid-19: Secondary | ICD-10-CM | POA: Diagnosis not present

## 2019-11-08 ENCOUNTER — Telehealth: Payer: Self-pay | Admitting: Internal Medicine

## 2019-11-08 DIAGNOSIS — E785 Hyperlipidemia, unspecified: Secondary | ICD-10-CM | POA: Diagnosis not present

## 2019-11-08 DIAGNOSIS — Z23 Encounter for immunization: Secondary | ICD-10-CM | POA: Diagnosis not present

## 2019-11-08 DIAGNOSIS — L03115 Cellulitis of right lower limb: Secondary | ICD-10-CM | POA: Diagnosis not present

## 2019-11-08 DIAGNOSIS — R278 Other lack of coordination: Secondary | ICD-10-CM | POA: Diagnosis not present

## 2019-11-08 NOTE — Telephone Encounter (Signed)
    Patient wants to know if she needs any immunizations this year

## 2019-11-09 DIAGNOSIS — M25569 Pain in unspecified knee: Secondary | ICD-10-CM | POA: Diagnosis not present

## 2019-11-09 DIAGNOSIS — G8929 Other chronic pain: Secondary | ICD-10-CM | POA: Diagnosis not present

## 2019-11-09 DIAGNOSIS — M109 Gout, unspecified: Secondary | ICD-10-CM | POA: Diagnosis not present

## 2019-11-09 DIAGNOSIS — F39 Unspecified mood [affective] disorder: Secondary | ICD-10-CM | POA: Diagnosis not present

## 2019-11-09 DIAGNOSIS — E1140 Type 2 diabetes mellitus with diabetic neuropathy, unspecified: Secondary | ICD-10-CM | POA: Diagnosis not present

## 2019-11-09 DIAGNOSIS — J45909 Unspecified asthma, uncomplicated: Secondary | ICD-10-CM | POA: Diagnosis not present

## 2019-11-09 DIAGNOSIS — N183 Chronic kidney disease, stage 3 unspecified: Secondary | ICD-10-CM | POA: Diagnosis not present

## 2019-11-09 DIAGNOSIS — I10 Essential (primary) hypertension: Secondary | ICD-10-CM | POA: Diagnosis not present

## 2019-11-09 NOTE — Telephone Encounter (Signed)
Spoke with patient today. 

## 2019-11-13 DIAGNOSIS — Z20822 Contact with and (suspected) exposure to covid-19: Secondary | ICD-10-CM | POA: Diagnosis not present

## 2019-11-20 DIAGNOSIS — Z20822 Contact with and (suspected) exposure to covid-19: Secondary | ICD-10-CM | POA: Diagnosis not present

## 2019-11-22 DIAGNOSIS — I5032 Chronic diastolic (congestive) heart failure: Secondary | ICD-10-CM | POA: Diagnosis not present

## 2019-11-22 DIAGNOSIS — E1122 Type 2 diabetes mellitus with diabetic chronic kidney disease: Secondary | ICD-10-CM | POA: Diagnosis not present

## 2019-11-22 DIAGNOSIS — N1831 Chronic kidney disease, stage 3a: Secondary | ICD-10-CM | POA: Diagnosis not present

## 2019-11-22 DIAGNOSIS — N2581 Secondary hyperparathyroidism of renal origin: Secondary | ICD-10-CM | POA: Diagnosis not present

## 2019-11-22 DIAGNOSIS — I890 Lymphedema, not elsewhere classified: Secondary | ICD-10-CM | POA: Diagnosis not present

## 2019-11-22 DIAGNOSIS — D631 Anemia in chronic kidney disease: Secondary | ICD-10-CM | POA: Diagnosis not present

## 2019-11-22 DIAGNOSIS — N189 Chronic kidney disease, unspecified: Secondary | ICD-10-CM | POA: Diagnosis not present

## 2019-12-04 DIAGNOSIS — R278 Other lack of coordination: Secondary | ICD-10-CM | POA: Diagnosis not present

## 2019-12-04 DIAGNOSIS — L03115 Cellulitis of right lower limb: Secondary | ICD-10-CM | POA: Diagnosis not present

## 2019-12-04 DIAGNOSIS — E785 Hyperlipidemia, unspecified: Secondary | ICD-10-CM | POA: Diagnosis not present

## 2019-12-04 DIAGNOSIS — Z20822 Contact with and (suspected) exposure to covid-19: Secondary | ICD-10-CM | POA: Diagnosis not present

## 2019-12-05 DIAGNOSIS — R278 Other lack of coordination: Secondary | ICD-10-CM | POA: Diagnosis not present

## 2019-12-05 DIAGNOSIS — L03115 Cellulitis of right lower limb: Secondary | ICD-10-CM | POA: Diagnosis not present

## 2019-12-05 DIAGNOSIS — E785 Hyperlipidemia, unspecified: Secondary | ICD-10-CM | POA: Diagnosis not present

## 2019-12-06 DIAGNOSIS — E785 Hyperlipidemia, unspecified: Secondary | ICD-10-CM | POA: Diagnosis not present

## 2019-12-06 DIAGNOSIS — R278 Other lack of coordination: Secondary | ICD-10-CM | POA: Diagnosis not present

## 2019-12-06 DIAGNOSIS — L03115 Cellulitis of right lower limb: Secondary | ICD-10-CM | POA: Diagnosis not present

## 2019-12-07 DIAGNOSIS — N183 Chronic kidney disease, stage 3 unspecified: Secondary | ICD-10-CM | POA: Diagnosis not present

## 2019-12-07 DIAGNOSIS — E1140 Type 2 diabetes mellitus with diabetic neuropathy, unspecified: Secondary | ICD-10-CM | POA: Diagnosis not present

## 2019-12-07 DIAGNOSIS — E785 Hyperlipidemia, unspecified: Secondary | ICD-10-CM | POA: Diagnosis not present

## 2019-12-07 DIAGNOSIS — M25569 Pain in unspecified knee: Secondary | ICD-10-CM | POA: Diagnosis not present

## 2019-12-07 DIAGNOSIS — M109 Gout, unspecified: Secondary | ICD-10-CM | POA: Diagnosis not present

## 2019-12-07 DIAGNOSIS — J45909 Unspecified asthma, uncomplicated: Secondary | ICD-10-CM | POA: Diagnosis not present

## 2019-12-07 DIAGNOSIS — G8929 Other chronic pain: Secondary | ICD-10-CM | POA: Diagnosis not present

## 2019-12-07 DIAGNOSIS — L03115 Cellulitis of right lower limb: Secondary | ICD-10-CM | POA: Diagnosis not present

## 2019-12-07 DIAGNOSIS — F39 Unspecified mood [affective] disorder: Secondary | ICD-10-CM | POA: Diagnosis not present

## 2019-12-07 DIAGNOSIS — I10 Essential (primary) hypertension: Secondary | ICD-10-CM | POA: Diagnosis not present

## 2019-12-07 DIAGNOSIS — R278 Other lack of coordination: Secondary | ICD-10-CM | POA: Diagnosis not present

## 2019-12-08 ENCOUNTER — Ambulatory Visit: Payer: Medicare PPO

## 2019-12-11 DIAGNOSIS — Z20828 Contact with and (suspected) exposure to other viral communicable diseases: Secondary | ICD-10-CM | POA: Diagnosis not present

## 2019-12-13 ENCOUNTER — Encounter: Payer: Self-pay | Admitting: Podiatry

## 2019-12-13 ENCOUNTER — Ambulatory Visit (INDEPENDENT_AMBULATORY_CARE_PROVIDER_SITE_OTHER): Payer: Medicare PPO | Admitting: Podiatry

## 2019-12-13 ENCOUNTER — Other Ambulatory Visit: Payer: Self-pay

## 2019-12-13 DIAGNOSIS — I890 Lymphedema, not elsewhere classified: Secondary | ICD-10-CM

## 2019-12-13 DIAGNOSIS — E1142 Type 2 diabetes mellitus with diabetic polyneuropathy: Secondary | ICD-10-CM

## 2019-12-13 DIAGNOSIS — B351 Tinea unguium: Secondary | ICD-10-CM

## 2019-12-13 DIAGNOSIS — M2141 Flat foot [pes planus] (acquired), right foot: Secondary | ICD-10-CM

## 2019-12-13 DIAGNOSIS — E785 Hyperlipidemia, unspecified: Secondary | ICD-10-CM | POA: Diagnosis not present

## 2019-12-13 DIAGNOSIS — L03115 Cellulitis of right lower limb: Secondary | ICD-10-CM | POA: Diagnosis not present

## 2019-12-13 DIAGNOSIS — M2142 Flat foot [pes planus] (acquired), left foot: Secondary | ICD-10-CM

## 2019-12-13 DIAGNOSIS — R278 Other lack of coordination: Secondary | ICD-10-CM | POA: Diagnosis not present

## 2019-12-14 DIAGNOSIS — R278 Other lack of coordination: Secondary | ICD-10-CM | POA: Diagnosis not present

## 2019-12-14 DIAGNOSIS — L03115 Cellulitis of right lower limb: Secondary | ICD-10-CM | POA: Diagnosis not present

## 2019-12-14 DIAGNOSIS — E785 Hyperlipidemia, unspecified: Secondary | ICD-10-CM | POA: Diagnosis not present

## 2019-12-15 DIAGNOSIS — L03115 Cellulitis of right lower limb: Secondary | ICD-10-CM | POA: Diagnosis not present

## 2019-12-15 DIAGNOSIS — E785 Hyperlipidemia, unspecified: Secondary | ICD-10-CM | POA: Diagnosis not present

## 2019-12-15 DIAGNOSIS — R278 Other lack of coordination: Secondary | ICD-10-CM | POA: Diagnosis not present

## 2019-12-17 NOTE — Progress Notes (Signed)
Subjective:  Patient ID: April [name], female    DOB: [birth year],  MRN: [id number]  69 y.o. female presents with preventative diabetic foot care and painful thick toenails that are difficult to trim. Pain interferes with ambulation. Aggravating factors include wearing enclosed shoe gear. Pain is relieved with periodic professional debridement.Marland Kitchen    PCP: [name], MD.  She states she is hoping to go home soon after a wheelchair ramp is built for her home.   Review of Systems: Negative except as noted in the HPI.  Past Medical History:  Diagnosis Date  . Anemia   . Asthma   . CAD (coronary artery disease)   . Carpal tunnel syndrome   . Cellulitis of both lower extremities 04/11/2015  . CHF (congestive heart failure) ([place])   . Chronic kidney disease    [facility]- followed by [facility]  . Colon polyp, hyperplastic 2007 & 2012  . Complication of anesthesia 1999   svt with renal calculi surgery, no problems since  . CTS (carpal tunnel syndrome)    bilateral  . Diabetes mellitus   . Eczema   . GERD (gastroesophageal reflux disease)   . History of kidney stones 1999  . Hyperlipidemia   . Hypertension   . Leg ulcer ([place]) 04/24/2015   Right lateral leg No evidence of an infection Monitor closely Keep edema controlled   . Leg ulcer ([place])    right lower  . Meralgia paresthetica    Dr. [name]  . Morbid obesity ([place])   . Morbid obesity ([place])   . Neuropathy    toes and legs  . Osteoarthrosis, unspecified whether generalized or localized, lower leg    knee  . PUD (peptic ulcer disease)   . Shortness of breath dyspnea    with exertion  . Sleep apnea    per progress note 02/25/2018  . Type II or unspecified type diabetes mellitus without mention of complication, not stated as uncontrolled   . Unspecified hereditary and idiopathic peripheral neuropathy   . Urticaria   . Vitamin B12 deficiency   . Wears glasses   . Wound cellulitis    right upper leg, healing well   Past  Surgical History:  Procedure Laterality Date  . ABDOMINAL HYSTERECTOMY    . BREAST BIOPSY Right 07/08/2018  . CARDIAC CATHETERIZATION  2002   non obstructive disease  . colonoscopy with polypectomy  2007 & 2012    hyperplastic ;Dr Watt Climes  . COLONOSCOPY WITH PROPOFOL N/A 06/04/2015   Procedure: COLONOSCOPY WITH PROPOFOL;  Surgeon: Jerene Bears, MD;  Location: [facility];  Service: Gastroenterology;  Laterality: N/A;  . DEBRIDEMENT LEG Right 03/02/2018   WOUND VAC APPLIED  . DEBRIDEMENT LEG Right 05/27/2018   RIGHT LOWER LEG DEBRIDEMENT,  SKIN GRAFT, VAC PLACEMENT   . DILATION AND CURETTAGE OF UTERUS     multiple  . HEMORRHOID SURGERY    . I & D EXTREMITY Right 03/02/2018   Procedure: RIGHT LEG DEBRIDEMENT AND PLACE VAC;  Surgeon: [name], MD;  Location: [facility];  Service: Orthopedics;  Laterality: Right;  . I & D EXTREMITY Right 03/04/2018   Procedure: REPEAT IRRIGATION AND DEBRIDEMENT RIGHT LEG, PLACE WOUND VAC;  Surgeon: [name], MD;  Location: [facility];  Service: Orthopedics;  Laterality: Right;  . I & D EXTREMITY Right 03/30/2018   Procedure: IRRIGATION AND DEBRIDEMENT RIGHT LEG, APPLY WOUND VAC;  Surgeon: [name], MD;  Location: [facility];  Service: Orthopedics;  Laterality:  Right;  . I & D EXTREMITY Right 05/27/2018   Procedure: RIGHT LOWER LEG DEBRIDEMENT,  SKIN GRAFT, VAC PLACEMENT;  Surgeon: [name], MD;  Location: [facility];  Service: Orthopedics;  Laterality: Right;  RIGHT LOWER LEG DEBRIDEMENT,  SKIN GRAFT, VAC PLACEMENT  . renal calculi  12/1997   SVT with induction of anesthesia  . right knee arthroscopy    . SKIN SPLIT GRAFT Right 03/04/2018   Procedure: POSSIBLE SPLIT THICKNESS SKIN GRAFT;  Surgeon: [name], MD;  Location: [facility];  Service: Orthopedics;  Laterality: Right;  . SKIN SPLIT GRAFT Right 04/01/2018   Procedure: REPEAT IRRIGATION AND DEBRIDEMENT RIGHT LEG, APPLY SPLIT THICKNESS SKIN GRAFT;  Surgeon: [name], MD;  Location: [facility];  Service:  Orthopedics;  Laterality: Right;  . TONSILLECTOMY AND ADENOIDECTOMY     Patient Active Problem List   Diagnosis Date Noted  . SOB (shortness of breath)   . Acute idiopathic gout of right foot   . Morbid obesity ([place])   . Non-pressure chronic ulcer of left heel and midfoot limited to breakdown of skin ([place])   . Idiopathic chronic gout of multiple sites without tophus   . Cellulitis 02/02/2019  . Pneumonia due to COVID-19 virus 12/10/2018  . COVID-19 virus infection 12/06/2018  . Chronic venous hypertension (idiopathic) with ulcer of right lower extremity (CODE) ([place]) 11/09/2018  . UTI (urinary tract infection) 11/09/2018  . Right hip pain 11/09/2018  . Achilles tendon sprain, left, initial encounter   . Osteomyelitis of ankle or foot, acute, left ([place]) 09/07/2018  . Osteomyelitis of ankle or foot, left, acute ([place]) 09/07/2018  . Acute on chronic kidney failure ([place]) 08/15/2018  . Onychomycosis 05/19/2018  . Idiopathic chronic venous hypertension of right lower extremity with ulcer and inflammation ([place]) 05/19/2018  . Traumatic wound dehiscence   . Skin lumps 01/14/2018  . Bilateral leg cramps 12/14/2017  . Left shoulder pain 08/14/2016  . Meralgia paresthetica 07/16/2016  . Chronic left-sided low back pain with left-sided sciatica 06/02/2016  . AKI (acute kidney injury) ([place]) 03/28/2016  . Whole body pain 03/20/2016  . Neuropathy 03/20/2016  . Diabetic peripheral neuropathy ([place]) 03/20/2016  . Osteopenia 01/11/2016  . CKD (chronic kidney disease) stage 3, GFR 30-59 ml/min ([facility]) 01/02/2016  . Hand paresthesia 07/18/2015  . Carpal tunnel syndrome 06/07/2015  . Family history of colon cancer   . Diabetes mellitus with neurological manifestations ([place]) 04/18/2015  . Bilateral leg edema 04/11/2015  . Abnormality of gait 01/03/2015  . Hereditary and idiopathic peripheral neuropathy 01/03/2015  . GERD (gastroesophageal reflux disease) 06/17/2014  . Hx of colonic polyps 12/15/2012   . Vitamin B12 deficiency 11/03/2012  . Intrinsic asthma 03/23/2012  . Chronic diastolic heart failure ([place]) 02/20/2011  . OSA (obstructive sleep apnea) 09/17/2010  . URINARY URGENCY 01/08/2010  . CAD, NATIVE VESSEL 11/20/2008  . Osteoarthritis of knees, bilateral 06/14/2008  . Hyperlipidemia 05/10/2007  . Essential hypertension 01/18/2007  . Hypokalemia 04/30/2006  . Morbid (severe) obesity due to excess calories ([place]) 04/30/2006  . HX, PERSONAL, PEPTIC ULCER DISEASE 04/30/2006    Current Outpatient Medications:  .  acetaminophen (TYLENOL) 325 MG tablet, Take 2 tablets (650 mg total) by mouth every 6 (six) hours as needed for moderate pain (or Fever >/= 101)., Disp: 20 tablet, Rfl: 0 .  allopurinol (ZYLOPRIM) 100 MG tablet, Take 1 tablet (100 mg total) by mouth daily., Disp: 90 tablet, Rfl: 1 .  aspirin (ECOTRIN LOW STRENGTH) 81 MG EC  tablet, Take 81 mg by mouth at bedtime. , Disp: , Rfl:  .  Budesonide (PULMICORT FLEXHALER) 90 MCG/ACT inhaler, Inhale 2 puffs into the lungs 2 (two) times daily., Disp: , Rfl:  .  budesonide-formoterol (SYMBICORT) 160-4.5 MCG/ACT inhaler, one - two inhalations every 12 hours; gargle and spit after use (Patient taking differently: Inhale 2 puffs into the lungs 2 (two) times daily. gargle and spit after use), Disp: 1 Inhaler, Rfl: 4 .  calcium carbonate (TUMS - DOSED IN MG ELEMENTAL CALCIUM) 500 MG chewable tablet, Chew 2 tablets by mouth daily as needed for indigestion or heartburn., Disp: , Rfl:  .  carvedilol (COREG) 12.5 MG tablet, TAKE 1 TABLET(12.5 MG) BY MOUTH TWICE DAILY WITH A MEAL, Disp: 180 tablet, Rfl: 0 .  cholecalciferol (VITAMIN D3) 25 MCG (1000 UT) tablet, Take 1,000 Units by mouth daily., Disp: , Rfl:  .  COD LIVER OIL PO, Take 1 capsule by mouth daily., Disp: , Rfl:  .  colchicine 0.6 MG tablet, Take 0.5 tablets (0.3 mg total) by mouth daily., Disp: 90 tablet, Rfl: 1 .  cyanocobalamin (,VITAMIN B-12,) 1000 MCG/ML injection, INJECT 1ML INTO  MUSCLE EVERY 30 DAYS (Patient taking differently: Inject 1,000 mcg into the muscle every 30 (thirty) days. ), Disp: 10 mL, Rfl: 0 .  diclofenac sodium (VOLTAREN) 1 % GEL, Apply 2 g topically 4 (four) times daily., Disp: 100 g, Rfl: 11 .  EPINEPHrine 0.3 mg/0.3 mL IJ SOAJ injection, epinephrine 0.3 mg/0.3 mL injection, auto-injector  INJECT 0.3 MLS INTO MUSCLE ONCE AS NEEDED FOR ANAPHYLAXIS, Disp: , Rfl:  .  estradiol (ESTRACE) 0.1 MG/GM vaginal cream, Place 1 Applicatorful vaginally at bedtime., Disp: 42.5 g, Rfl: 12 .  famotidine (PEPCID) 20 MG tablet, Take 1 tablet (20 mg total) by mouth 2 (two) times daily., Disp: 180 tablet, Rfl: 3 .  ferrous sulfate 325 (65 FE) MG tablet, Take 1 tablet (325 mg total) by mouth every Monday, Wednesday, and Friday., Disp: 60 tablet, Rfl: 0 .  folic acid (FOLVITE) 619 MCG tablet, Take 400 mcg by mouth at bedtime. , Disp: , Rfl:  .  furosemide (LASIX) 80 MG tablet, TAKE 1 TABLET(80 MG) BY MOUTH TWICE DAILY, Disp: 180 tablet, Rfl: 0 .  gabapentin (NEURONTIN) 100 MG capsule, Take 1 capsule (100 mg total) by mouth 3 (three) times daily., Disp: 90 capsule, Rfl: 0 .  guaiFENesin-dextromethorphan (ROBITUSSIN DM) 100-10 MG/5ML syrup, Take 5 mLs by mouth every 6 (six) hours as needed for cough., Disp: 118 mL, Rfl: 0 .  hydrocortisone (ANUSOL-HC) 2.5 % rectal cream, Place 1 application rectally 2 (two) times daily., Disp: 30 g, Rfl: 0 .  hydrOXYzine (ATARAX/VISTARIL) 25 MG tablet, Take 1 tablet (25 mg total) by mouth 3 (three) times daily as needed for itching., Disp: 30 tablet, Rfl: 0 .  lactobacillus acidophilus (BACID) TABS tablet, Take 2 tablets by mouth 3 (three) times daily., Disp: 30 tablet, Rfl: 0 .  montelukast (SINGULAIR) 10 MG tablet, Take 1 tablet (10 mg total) by mouth at bedtime., Disp: 30 tablet, Rfl: 5 .  Ostomy Supplies (SKIN PREP WIPES) MISC, , Disp: , Rfl:  .  Oxcarbazepine (TRILEPTAL) 300 MG tablet, Take 1 tablet (300 mg total) by mouth 2 (two) times  daily., Disp: 60 tablet, Rfl: 0 .  oxyCODONE (OXY IR/ROXICODONE) 5 MG immediate release tablet, Take 1 tablet (5 mg total) by mouth every 6 (six) hours as needed for moderate pain., Disp: 5 tablet, Rfl: 0 .  oxyCODONE-acetaminophen (PERCOCET/ROXICET) 5-325  MG tablet, oxycodone-acetaminophen 5 mg-325 mg tablet  TAKE 1 TABLET BY MOUTH EVERY 6 HOURS AS NEEDED FOR PAIN, Disp: , Rfl:  .  permethrin (ELIMITE) 5 % cream, permethrin 5 % topical cream  APPLY TWICE DAILY AS DIRECTED, Disp: , Rfl:  .  polyethylene glycol (MIRALAX / GLYCOLAX) 17 g packet, Take 17 g by mouth daily., Disp: 14 each, Rfl: 0 .  potassium chloride SA (KLOR-CON) 20 MEQ tablet, Take 20 mEq by mouth 3 (three) times daily., Disp: , Rfl:  .  senna-docusate (SENOKOT-S) 8.6-50 MG tablet, Take 1 tablet by mouth at bedtime as needed for mild constipation., Disp: 30 tablet, Rfl: 0 .  silver sulfADIAZINE (SILVADENE) 1 % cream, Apply 1 application topically daily., Disp: 400 g, Rfl: 0 .  simvastatin (ZOCOR) 40 MG tablet, Take 1 tablet (40 mg total) by mouth at bedtime., Disp: 90 tablet, Rfl: 1 .  spironolactone (ALDACTONE) 25 MG tablet, Take 1 tablet (25 mg total) by mouth daily., Disp: 90 tablet, Rfl: 0 .  traMADol (ULTRAM) 50 MG tablet, tramadol 50 mg tablet  TAKE 1 TABLET BY MOUTH EVERY 6 HOURS AS NEEDED FOR PAIN, Disp: , Rfl:  .  triamcinolone cream (KENALOG) 0.1 %, , Disp: , Rfl:  .  trimethoprim-polymyxin b (POLYTRIM) ophthalmic solution, , Disp: , Rfl:  .  vancomycin (VANCOCIN) 10 G SOLR injection, vancomycin 10 gram intravenous solution, Disp: , Rfl:  .  VENTOLIN HFA 108 (90 Base) MCG/ACT inhaler, , Disp: , Rfl:  Allergies  Allergen Reactions  . Penicillins Rash and Other (See Comments)    She was told not to take it anymore. Has patient had a PCN reaction causing immediate rash, facial/tongue/throat swelling, SOB or lightheadedness with hypotension: Yes Has patient had a PCN reaction causing severe rash involving mucus membranes or  skin necrosis: No Has patient had a PCN reaction that required hospitalization: No Has patient had a PCN reaction occurring within the last 10 years: No If all of the above answers are "NO", then may proceed with Cephalosporin use.'  . Shellfish Allergy Anaphylaxis  . Sulfonamide Derivatives Anaphylaxis and Other (See Comments)  . Hydrochlorothiazide W-Triamterene Other (See Comments)    Hypokalemia  . Lotensin [Benazepril Hcl] Hives  . Dipyridamole Other (See Comments)    Unknown reaction  . Estrogens Other (See Comments)    Unknown reaction  . Hydrochlorothiazide Other (See Comments)    Unknown reaction  . Metronidazole Other (See Comments)    Unknown reaction  . Other Itching    PICKLES  . Spironolactone Other (See Comments)    UNSPECIFIED > "kidney problems"  . Sulfa Antibiotics Other (See Comments)    Unknown reaction  . Torsemide Other (See Comments)    Unknown reaction  . Valsartan Other (See Comments)    Unknown reaction  . Madelaine Bhat Isothiocyanate] Itching   Social History   Tobacco Use  Smoking Status Never Smoker  Smokeless Tobacco Never Used    Objective:  There were no vitals filed for this visit. Constitutional Patient is a pleasant 69 y.o. African American female morbidly obese in NAD. AAO x 3.  Vascular Capillary refill time to digits <4 seconds b/l lower extremities. Nonpalpable DP pulse(s) b/l lower extremities. Nonpalpable PT pulse(s) b/l lower extremities. Pedal hair absent. Lower extremity skin temperature gradient within normal limits. Lymphedema present b/l lower extremities. No cyanosis or clubbing noted.  Neurologic Normal speech. Protective sensation diminished with 10g monofilament b/l.  Dermatologic Pedal skin with normal turgor, texture and  tone bilaterally. No open wounds bilaterally. No interdigital macerations bilaterally. Toenails 1-5 b/l elongated, discolored, dystrophic, thickened, crumbly with subungual debris and tenderness to dorsal  palpation.  Orthopedic: Normal muscle strength 5/5 to all lower extremity muscle groups bilaterally. Pes planus deformity noted b/l.  Utilizes wheelchair for mobility assistance.    Assessment:   1. Onychomycosis   2. Lymphedema   3. Pes planus of both feet   4. Diabetic polyneuropathy associated with type 2 diabetes mellitus ([place])    Plan:  Patient was evaluated and treated and all questions answered.  Onychomycosis with pain -Nails palliatively debridement as below. -Educated on self-care  Procedure: Nail Debridement Rationale: Pain Type of Debridement: manual, sharp debridement. Instrumentation: Nail nipper, rotary burr. Number of Nails: 10  -Examined patient. -Continue diabetic foot care principles. -Patient to continue soft, supportive shoe gear daily. -Toenails 1-5 b/l were debrided in length and girth with sterile nail nippers and dremel without iatrogenic bleeding.  -Patient to report any pedal injuries to medical professional immediately. -Patient/POA to call should there be question/concern in the interim.  Return in about 3 months (around 03/14/2020) for diabetic foot care.  Marzetta Board, DPM

## 2019-12-18 DIAGNOSIS — R278 Other lack of coordination: Secondary | ICD-10-CM | POA: Diagnosis not present

## 2019-12-18 DIAGNOSIS — Z20822 Contact with and (suspected) exposure to covid-19: Secondary | ICD-10-CM | POA: Diagnosis not present

## 2019-12-18 DIAGNOSIS — L03115 Cellulitis of right lower limb: Secondary | ICD-10-CM | POA: Diagnosis not present

## 2019-12-18 DIAGNOSIS — E785 Hyperlipidemia, unspecified: Secondary | ICD-10-CM | POA: Diagnosis not present

## 2019-12-19 ENCOUNTER — Telehealth: Payer: Self-pay | Admitting: Physician Assistant

## 2019-12-19 ENCOUNTER — Encounter: Payer: Self-pay | Admitting: Physician Assistant

## 2019-12-19 NOTE — Telephone Encounter (Signed)
Called pt about third dose of Moderna, but she said she already got it at [facility].   [name] PA-C  [facility]

## 2019-12-20 DIAGNOSIS — L03115 Cellulitis of right lower limb: Secondary | ICD-10-CM | POA: Diagnosis not present

## 2019-12-20 DIAGNOSIS — R278 Other lack of coordination: Secondary | ICD-10-CM | POA: Diagnosis not present

## 2019-12-20 DIAGNOSIS — E785 Hyperlipidemia, unspecified: Secondary | ICD-10-CM | POA: Diagnosis not present

## 2019-12-21 DIAGNOSIS — M26609 Unspecified temporomandibular joint disorder, unspecified side: Secondary | ICD-10-CM | POA: Diagnosis not present

## 2019-12-22 DIAGNOSIS — L03115 Cellulitis of right lower limb: Secondary | ICD-10-CM | POA: Diagnosis not present

## 2019-12-22 DIAGNOSIS — R278 Other lack of coordination: Secondary | ICD-10-CM | POA: Diagnosis not present

## 2019-12-22 DIAGNOSIS — E785 Hyperlipidemia, unspecified: Secondary | ICD-10-CM | POA: Diagnosis not present

## 2019-12-25 DIAGNOSIS — R278 Other lack of coordination: Secondary | ICD-10-CM | POA: Diagnosis not present

## 2019-12-25 DIAGNOSIS — L03115 Cellulitis of right lower limb: Secondary | ICD-10-CM | POA: Diagnosis not present

## 2019-12-25 DIAGNOSIS — E785 Hyperlipidemia, unspecified: Secondary | ICD-10-CM | POA: Diagnosis not present

## 2019-12-25 DIAGNOSIS — Z20822 Contact with and (suspected) exposure to covid-19: Secondary | ICD-10-CM | POA: Diagnosis not present

## 2019-12-27 DIAGNOSIS — R278 Other lack of coordination: Secondary | ICD-10-CM | POA: Diagnosis not present

## 2019-12-27 DIAGNOSIS — L03115 Cellulitis of right lower limb: Secondary | ICD-10-CM | POA: Diagnosis not present

## 2019-12-27 DIAGNOSIS — E785 Hyperlipidemia, unspecified: Secondary | ICD-10-CM | POA: Diagnosis not present

## 2019-12-29 DIAGNOSIS — R278 Other lack of coordination: Secondary | ICD-10-CM | POA: Diagnosis not present

## 2019-12-29 DIAGNOSIS — E785 Hyperlipidemia, unspecified: Secondary | ICD-10-CM | POA: Diagnosis not present

## 2019-12-29 DIAGNOSIS — L03115 Cellulitis of right lower limb: Secondary | ICD-10-CM | POA: Diagnosis not present

## 2020-01-03 DIAGNOSIS — R278 Other lack of coordination: Secondary | ICD-10-CM | POA: Diagnosis not present

## 2020-01-03 DIAGNOSIS — L03115 Cellulitis of right lower limb: Secondary | ICD-10-CM | POA: Diagnosis not present

## 2020-01-03 DIAGNOSIS — R262 Difficulty in walking, not elsewhere classified: Secondary | ICD-10-CM | POA: Diagnosis not present

## 2020-01-03 DIAGNOSIS — E785 Hyperlipidemia, unspecified: Secondary | ICD-10-CM | POA: Diagnosis not present

## 2020-01-04 DIAGNOSIS — R262 Difficulty in walking, not elsewhere classified: Secondary | ICD-10-CM | POA: Diagnosis not present

## 2020-01-04 DIAGNOSIS — G8929 Other chronic pain: Secondary | ICD-10-CM | POA: Diagnosis not present

## 2020-01-04 DIAGNOSIS — L03115 Cellulitis of right lower limb: Secondary | ICD-10-CM | POA: Diagnosis not present

## 2020-01-04 DIAGNOSIS — J45909 Unspecified asthma, uncomplicated: Secondary | ICD-10-CM | POA: Diagnosis not present

## 2020-01-04 DIAGNOSIS — R278 Other lack of coordination: Secondary | ICD-10-CM | POA: Diagnosis not present

## 2020-01-04 DIAGNOSIS — N183 Chronic kidney disease, stage 3 unspecified: Secondary | ICD-10-CM | POA: Diagnosis not present

## 2020-01-04 DIAGNOSIS — E785 Hyperlipidemia, unspecified: Secondary | ICD-10-CM | POA: Diagnosis not present

## 2020-01-04 DIAGNOSIS — E1140 Type 2 diabetes mellitus with diabetic neuropathy, unspecified: Secondary | ICD-10-CM | POA: Diagnosis not present

## 2020-01-04 DIAGNOSIS — M26609 Unspecified temporomandibular joint disorder, unspecified side: Secondary | ICD-10-CM | POA: Diagnosis not present

## 2020-01-04 DIAGNOSIS — M109 Gout, unspecified: Secondary | ICD-10-CM | POA: Diagnosis not present

## 2020-01-04 DIAGNOSIS — I10 Essential (primary) hypertension: Secondary | ICD-10-CM | POA: Diagnosis not present

## 2020-01-04 DIAGNOSIS — M25569 Pain in unspecified knee: Secondary | ICD-10-CM | POA: Diagnosis not present

## 2020-01-04 DIAGNOSIS — F39 Unspecified mood [affective] disorder: Secondary | ICD-10-CM | POA: Diagnosis not present

## 2020-01-05 DIAGNOSIS — E785 Hyperlipidemia, unspecified: Secondary | ICD-10-CM | POA: Diagnosis not present

## 2020-01-05 DIAGNOSIS — L03115 Cellulitis of right lower limb: Secondary | ICD-10-CM | POA: Diagnosis not present

## 2020-01-05 DIAGNOSIS — N390 Urinary tract infection, site not specified: Secondary | ICD-10-CM | POA: Diagnosis not present

## 2020-01-05 DIAGNOSIS — R278 Other lack of coordination: Secondary | ICD-10-CM | POA: Diagnosis not present

## 2020-01-05 DIAGNOSIS — R262 Difficulty in walking, not elsewhere classified: Secondary | ICD-10-CM | POA: Diagnosis not present

## 2020-01-08 DIAGNOSIS — L03115 Cellulitis of right lower limb: Secondary | ICD-10-CM | POA: Diagnosis not present

## 2020-01-08 DIAGNOSIS — R262 Difficulty in walking, not elsewhere classified: Secondary | ICD-10-CM | POA: Diagnosis not present

## 2020-01-08 DIAGNOSIS — E785 Hyperlipidemia, unspecified: Secondary | ICD-10-CM | POA: Diagnosis not present

## 2020-01-08 DIAGNOSIS — R278 Other lack of coordination: Secondary | ICD-10-CM | POA: Diagnosis not present

## 2020-01-12 ENCOUNTER — Ambulatory Visit: Payer: Medicare PPO

## 2020-01-16 DIAGNOSIS — L03115 Cellulitis of right lower limb: Secondary | ICD-10-CM | POA: Diagnosis not present

## 2020-01-16 DIAGNOSIS — R262 Difficulty in walking, not elsewhere classified: Secondary | ICD-10-CM | POA: Diagnosis not present

## 2020-01-16 DIAGNOSIS — E785 Hyperlipidemia, unspecified: Secondary | ICD-10-CM | POA: Diagnosis not present

## 2020-01-16 DIAGNOSIS — R278 Other lack of coordination: Secondary | ICD-10-CM | POA: Diagnosis not present

## 2020-01-18 DIAGNOSIS — R262 Difficulty in walking, not elsewhere classified: Secondary | ICD-10-CM | POA: Diagnosis not present

## 2020-01-18 DIAGNOSIS — E785 Hyperlipidemia, unspecified: Secondary | ICD-10-CM | POA: Diagnosis not present

## 2020-01-18 DIAGNOSIS — L03115 Cellulitis of right lower limb: Secondary | ICD-10-CM | POA: Diagnosis not present

## 2020-01-18 DIAGNOSIS — R278 Other lack of coordination: Secondary | ICD-10-CM | POA: Diagnosis not present

## 2020-01-19 DIAGNOSIS — L03115 Cellulitis of right lower limb: Secondary | ICD-10-CM | POA: Diagnosis not present

## 2020-01-19 DIAGNOSIS — E785 Hyperlipidemia, unspecified: Secondary | ICD-10-CM | POA: Diagnosis not present

## 2020-01-19 DIAGNOSIS — R278 Other lack of coordination: Secondary | ICD-10-CM | POA: Diagnosis not present

## 2020-01-19 DIAGNOSIS — R262 Difficulty in walking, not elsewhere classified: Secondary | ICD-10-CM | POA: Diagnosis not present

## 2020-01-22 DIAGNOSIS — Z20822 Contact with and (suspected) exposure to covid-19: Secondary | ICD-10-CM | POA: Diagnosis not present

## 2020-01-23 DIAGNOSIS — E785 Hyperlipidemia, unspecified: Secondary | ICD-10-CM | POA: Diagnosis not present

## 2020-01-23 DIAGNOSIS — R262 Difficulty in walking, not elsewhere classified: Secondary | ICD-10-CM | POA: Diagnosis not present

## 2020-01-23 DIAGNOSIS — L03115 Cellulitis of right lower limb: Secondary | ICD-10-CM | POA: Diagnosis not present

## 2020-01-23 DIAGNOSIS — R278 Other lack of coordination: Secondary | ICD-10-CM | POA: Diagnosis not present

## 2020-01-24 DIAGNOSIS — R262 Difficulty in walking, not elsewhere classified: Secondary | ICD-10-CM | POA: Diagnosis not present

## 2020-01-24 DIAGNOSIS — L03115 Cellulitis of right lower limb: Secondary | ICD-10-CM | POA: Diagnosis not present

## 2020-01-24 DIAGNOSIS — E785 Hyperlipidemia, unspecified: Secondary | ICD-10-CM | POA: Diagnosis not present

## 2020-01-24 DIAGNOSIS — R278 Other lack of coordination: Secondary | ICD-10-CM | POA: Diagnosis not present

## 2020-01-26 DIAGNOSIS — R278 Other lack of coordination: Secondary | ICD-10-CM | POA: Diagnosis not present

## 2020-01-26 DIAGNOSIS — L03115 Cellulitis of right lower limb: Secondary | ICD-10-CM | POA: Diagnosis not present

## 2020-01-26 DIAGNOSIS — R262 Difficulty in walking, not elsewhere classified: Secondary | ICD-10-CM | POA: Diagnosis not present

## 2020-01-26 DIAGNOSIS — E785 Hyperlipidemia, unspecified: Secondary | ICD-10-CM | POA: Diagnosis not present

## 2020-01-29 DIAGNOSIS — Z20822 Contact with and (suspected) exposure to covid-19: Secondary | ICD-10-CM | POA: Diagnosis not present

## 2020-02-01 DIAGNOSIS — R278 Other lack of coordination: Secondary | ICD-10-CM | POA: Diagnosis not present

## 2020-02-01 DIAGNOSIS — R262 Difficulty in walking, not elsewhere classified: Secondary | ICD-10-CM | POA: Diagnosis not present

## 2020-02-01 DIAGNOSIS — L03115 Cellulitis of right lower limb: Secondary | ICD-10-CM | POA: Diagnosis not present

## 2020-02-01 DIAGNOSIS — E785 Hyperlipidemia, unspecified: Secondary | ICD-10-CM | POA: Diagnosis not present

## 2020-02-02 DIAGNOSIS — E785 Hyperlipidemia, unspecified: Secondary | ICD-10-CM | POA: Diagnosis not present

## 2020-02-02 DIAGNOSIS — R278 Other lack of coordination: Secondary | ICD-10-CM | POA: Diagnosis not present

## 2020-02-02 DIAGNOSIS — R262 Difficulty in walking, not elsewhere classified: Secondary | ICD-10-CM | POA: Diagnosis not present

## 2020-02-02 DIAGNOSIS — L03115 Cellulitis of right lower limb: Secondary | ICD-10-CM | POA: Diagnosis not present

## 2020-02-05 DIAGNOSIS — L03115 Cellulitis of right lower limb: Secondary | ICD-10-CM | POA: Diagnosis not present

## 2020-02-05 DIAGNOSIS — R278 Other lack of coordination: Secondary | ICD-10-CM | POA: Diagnosis not present

## 2020-02-05 DIAGNOSIS — R262 Difficulty in walking, not elsewhere classified: Secondary | ICD-10-CM | POA: Diagnosis not present

## 2020-02-06 DIAGNOSIS — R278 Other lack of coordination: Secondary | ICD-10-CM | POA: Diagnosis not present

## 2020-02-06 DIAGNOSIS — L03115 Cellulitis of right lower limb: Secondary | ICD-10-CM | POA: Diagnosis not present

## 2020-02-06 DIAGNOSIS — R262 Difficulty in walking, not elsewhere classified: Secondary | ICD-10-CM | POA: Diagnosis not present

## 2020-02-07 DIAGNOSIS — L03115 Cellulitis of right lower limb: Secondary | ICD-10-CM | POA: Diagnosis not present

## 2020-02-07 DIAGNOSIS — R278 Other lack of coordination: Secondary | ICD-10-CM | POA: Diagnosis not present

## 2020-02-07 DIAGNOSIS — Z20822 Contact with and (suspected) exposure to covid-19: Secondary | ICD-10-CM | POA: Diagnosis not present

## 2020-02-07 DIAGNOSIS — R262 Difficulty in walking, not elsewhere classified: Secondary | ICD-10-CM | POA: Diagnosis not present

## 2020-02-08 DIAGNOSIS — R262 Difficulty in walking, not elsewhere classified: Secondary | ICD-10-CM | POA: Diagnosis not present

## 2020-02-08 DIAGNOSIS — R278 Other lack of coordination: Secondary | ICD-10-CM | POA: Diagnosis not present

## 2020-02-08 DIAGNOSIS — L03115 Cellulitis of right lower limb: Secondary | ICD-10-CM | POA: Diagnosis not present

## 2020-02-09 DIAGNOSIS — L03115 Cellulitis of right lower limb: Secondary | ICD-10-CM | POA: Diagnosis not present

## 2020-02-09 DIAGNOSIS — R262 Difficulty in walking, not elsewhere classified: Secondary | ICD-10-CM | POA: Diagnosis not present

## 2020-02-09 DIAGNOSIS — R278 Other lack of coordination: Secondary | ICD-10-CM | POA: Diagnosis not present

## 2020-02-12 DIAGNOSIS — R262 Difficulty in walking, not elsewhere classified: Secondary | ICD-10-CM | POA: Diagnosis not present

## 2020-02-12 DIAGNOSIS — R278 Other lack of coordination: Secondary | ICD-10-CM | POA: Diagnosis not present

## 2020-02-12 DIAGNOSIS — L03115 Cellulitis of right lower limb: Secondary | ICD-10-CM | POA: Diagnosis not present

## 2020-02-14 DIAGNOSIS — L03115 Cellulitis of right lower limb: Secondary | ICD-10-CM | POA: Diagnosis not present

## 2020-02-14 DIAGNOSIS — R262 Difficulty in walking, not elsewhere classified: Secondary | ICD-10-CM | POA: Diagnosis not present

## 2020-02-14 DIAGNOSIS — R278 Other lack of coordination: Secondary | ICD-10-CM | POA: Diagnosis not present

## 2020-02-15 DIAGNOSIS — F39 Unspecified mood [affective] disorder: Secondary | ICD-10-CM | POA: Diagnosis not present

## 2020-02-15 DIAGNOSIS — E1140 Type 2 diabetes mellitus with diabetic neuropathy, unspecified: Secondary | ICD-10-CM | POA: Diagnosis not present

## 2020-02-15 DIAGNOSIS — I10 Essential (primary) hypertension: Secondary | ICD-10-CM | POA: Diagnosis not present

## 2020-02-15 DIAGNOSIS — R262 Difficulty in walking, not elsewhere classified: Secondary | ICD-10-CM | POA: Diagnosis not present

## 2020-02-15 DIAGNOSIS — L03115 Cellulitis of right lower limb: Secondary | ICD-10-CM | POA: Diagnosis not present

## 2020-02-15 DIAGNOSIS — G8929 Other chronic pain: Secondary | ICD-10-CM | POA: Diagnosis not present

## 2020-02-15 DIAGNOSIS — R278 Other lack of coordination: Secondary | ICD-10-CM | POA: Diagnosis not present

## 2020-02-15 DIAGNOSIS — M25569 Pain in unspecified knee: Secondary | ICD-10-CM | POA: Diagnosis not present

## 2020-02-15 DIAGNOSIS — M26609 Unspecified temporomandibular joint disorder, unspecified side: Secondary | ICD-10-CM | POA: Diagnosis not present

## 2020-02-15 DIAGNOSIS — N183 Chronic kidney disease, stage 3 unspecified: Secondary | ICD-10-CM | POA: Diagnosis not present

## 2020-02-15 DIAGNOSIS — J45909 Unspecified asthma, uncomplicated: Secondary | ICD-10-CM | POA: Diagnosis not present

## 2020-02-15 DIAGNOSIS — M109 Gout, unspecified: Secondary | ICD-10-CM | POA: Diagnosis not present

## 2020-02-16 ENCOUNTER — Ambulatory Visit: Payer: Medicare PPO

## 2020-02-16 DIAGNOSIS — R278 Other lack of coordination: Secondary | ICD-10-CM | POA: Diagnosis not present

## 2020-02-16 DIAGNOSIS — L03115 Cellulitis of right lower limb: Secondary | ICD-10-CM | POA: Diagnosis not present

## 2020-02-16 DIAGNOSIS — R262 Difficulty in walking, not elsewhere classified: Secondary | ICD-10-CM | POA: Diagnosis not present

## 2020-02-19 DIAGNOSIS — R262 Difficulty in walking, not elsewhere classified: Secondary | ICD-10-CM | POA: Diagnosis not present

## 2020-02-19 DIAGNOSIS — R278 Other lack of coordination: Secondary | ICD-10-CM | POA: Diagnosis not present

## 2020-02-19 DIAGNOSIS — L03115 Cellulitis of right lower limb: Secondary | ICD-10-CM | POA: Diagnosis not present

## 2020-02-20 DIAGNOSIS — R262 Difficulty in walking, not elsewhere classified: Secondary | ICD-10-CM | POA: Diagnosis not present

## 2020-02-20 DIAGNOSIS — R278 Other lack of coordination: Secondary | ICD-10-CM | POA: Diagnosis not present

## 2020-02-20 DIAGNOSIS — L03115 Cellulitis of right lower limb: Secondary | ICD-10-CM | POA: Diagnosis not present

## 2020-02-21 DIAGNOSIS — R278 Other lack of coordination: Secondary | ICD-10-CM | POA: Diagnosis not present

## 2020-02-21 DIAGNOSIS — R262 Difficulty in walking, not elsewhere classified: Secondary | ICD-10-CM | POA: Diagnosis not present

## 2020-02-21 DIAGNOSIS — L03115 Cellulitis of right lower limb: Secondary | ICD-10-CM | POA: Diagnosis not present

## 2020-02-22 DIAGNOSIS — R262 Difficulty in walking, not elsewhere classified: Secondary | ICD-10-CM | POA: Diagnosis not present

## 2020-02-22 DIAGNOSIS — L03115 Cellulitis of right lower limb: Secondary | ICD-10-CM | POA: Diagnosis not present

## 2020-02-22 DIAGNOSIS — R278 Other lack of coordination: Secondary | ICD-10-CM | POA: Diagnosis not present

## 2020-02-23 DIAGNOSIS — R262 Difficulty in walking, not elsewhere classified: Secondary | ICD-10-CM | POA: Diagnosis not present

## 2020-02-23 DIAGNOSIS — R278 Other lack of coordination: Secondary | ICD-10-CM | POA: Diagnosis not present

## 2020-02-23 DIAGNOSIS — L03115 Cellulitis of right lower limb: Secondary | ICD-10-CM | POA: Diagnosis not present

## 2020-02-26 DIAGNOSIS — Z20822 Contact with and (suspected) exposure to covid-19: Secondary | ICD-10-CM | POA: Diagnosis not present

## 2020-02-26 DIAGNOSIS — R262 Difficulty in walking, not elsewhere classified: Secondary | ICD-10-CM | POA: Diagnosis not present

## 2020-02-26 DIAGNOSIS — R278 Other lack of coordination: Secondary | ICD-10-CM | POA: Diagnosis not present

## 2020-02-26 DIAGNOSIS — L03115 Cellulitis of right lower limb: Secondary | ICD-10-CM | POA: Diagnosis not present

## 2020-02-27 DIAGNOSIS — R278 Other lack of coordination: Secondary | ICD-10-CM | POA: Diagnosis not present

## 2020-02-27 DIAGNOSIS — L03115 Cellulitis of right lower limb: Secondary | ICD-10-CM | POA: Diagnosis not present

## 2020-02-27 DIAGNOSIS — R262 Difficulty in walking, not elsewhere classified: Secondary | ICD-10-CM | POA: Diagnosis not present

## 2020-02-28 DIAGNOSIS — R262 Difficulty in walking, not elsewhere classified: Secondary | ICD-10-CM | POA: Diagnosis not present

## 2020-02-28 DIAGNOSIS — L03115 Cellulitis of right lower limb: Secondary | ICD-10-CM | POA: Diagnosis not present

## 2020-02-28 DIAGNOSIS — R278 Other lack of coordination: Secondary | ICD-10-CM | POA: Diagnosis not present

## 2020-02-29 DIAGNOSIS — L03115 Cellulitis of right lower limb: Secondary | ICD-10-CM | POA: Diagnosis not present

## 2020-02-29 DIAGNOSIS — R278 Other lack of coordination: Secondary | ICD-10-CM | POA: Diagnosis not present

## 2020-02-29 DIAGNOSIS — R262 Difficulty in walking, not elsewhere classified: Secondary | ICD-10-CM | POA: Diagnosis not present

## 2020-03-01 DIAGNOSIS — L03115 Cellulitis of right lower limb: Secondary | ICD-10-CM | POA: Diagnosis not present

## 2020-03-01 DIAGNOSIS — R262 Difficulty in walking, not elsewhere classified: Secondary | ICD-10-CM | POA: Diagnosis not present

## 2020-03-01 DIAGNOSIS — R278 Other lack of coordination: Secondary | ICD-10-CM | POA: Diagnosis not present

## 2020-03-04 DIAGNOSIS — R278 Other lack of coordination: Secondary | ICD-10-CM | POA: Diagnosis not present

## 2020-03-04 DIAGNOSIS — L03115 Cellulitis of right lower limb: Secondary | ICD-10-CM | POA: Diagnosis not present

## 2020-03-04 DIAGNOSIS — R262 Difficulty in walking, not elsewhere classified: Secondary | ICD-10-CM | POA: Diagnosis not present

## 2020-03-07 ENCOUNTER — Other Ambulatory Visit: Payer: Self-pay

## 2020-03-07 ENCOUNTER — Telehealth: Payer: Self-pay | Admitting: Internal Medicine

## 2020-03-07 NOTE — Progress Notes (Addendum)
Subjective:    Patient ID: April [name], female    DOB: 1950/12/28, 70 y.o.   MRN: [id number]  HPI The patient is here for follow up from rehab.  She has been in rehab for months.  She was diagnosed with cellulitis of her right shin and left foot and was hospitalized 02/02/19-03/01/19.  She also had severe pain from neuropathy, non-pressure chronic ulcer of left heel and midfoot, chronic gout, SOB and obesity.  She went to rehab at that time. She has been in rehab for a year.  She got home Tuesday. She will have someone come in to help.    Her husband has washed her and dressed her since being home.  She can get up from sitting on her own, but it is hard.  She has very limited mobility and difficulty with transfers and changing positions.  She typically needs assistance with this.  Her neuropathy causes an altered sensory perception and she has a history of lower extremity ulcers likely secondary to neuropathy and idiopathic chronic venous hypertension in her lower extremities.  She needs a new mattress with gel overlay for bed.  She also needs a bariatric wheelchair and commode.  She has increased swelling in her right lower leg below her scar.  This happened more recently.  She does elevate her leg when she sitting.  She has been compliant with a low-sodium diet.  Neuropathy pain intermittent - sometimes not controlled.    Rehab ordered home nursing, PT, home health aid, new mattress.  She has not started receiving the services.     Medications and allergies reviewed with patient and updated if appropriate.  Patient Active Problem List   Diagnosis Date Noted  . SOB (shortness of breath)   . Acute idiopathic gout of right foot   . Morbid obesity ([place])   . Non-pressure chronic ulcer of left heel and midfoot limited to breakdown of skin ([place])   . Idiopathic chronic gout of multiple sites without tophus   . Cellulitis 02/02/2019  . Pneumonia due to COVID-19 virus 12/10/2018  .  COVID-19 virus infection 12/06/2018  . UTI (urinary tract infection) 11/09/2018  . Right hip pain 11/09/2018  . Achilles tendon sprain, left, initial encounter   . Osteomyelitis of ankle or foot, acute, left ([place]) 09/07/2018  . Osteomyelitis of ankle or foot, left, acute ([place]) 09/07/2018  . Acute on chronic kidney failure ([place]) 08/15/2018  . Onychomycosis 05/19/2018  . Idiopathic chronic venous hypertension of right lower extremity with ulcer and inflammation ([place]) 05/19/2018  . Skin lumps 01/14/2018  . Bilateral leg cramps 12/14/2017  . Left shoulder pain 08/14/2016  . Meralgia paresthetica 07/16/2016  . Chronic left-sided low back pain with left-sided sciatica 06/02/2016  . AKI (acute kidney injury) ([place]) 03/28/2016  . Whole body pain 03/20/2016  . Diabetic peripheral neuropathy ([place]) 03/20/2016  . Osteopenia 01/11/2016  . CKD (chronic kidney disease) stage 3, GFR 30-59 ml/min ([facility]) 01/02/2016  . Hand paresthesia 07/18/2015  . Carpal tunnel syndrome 06/07/2015  . Family history of colon cancer   . Diabetes mellitus with neurological manifestations ([place]) 04/18/2015  . Bilateral leg edema 04/11/2015  . Abnormality of gait 01/03/2015  . Hereditary and idiopathic peripheral neuropathy 01/03/2015  . GERD (gastroesophageal reflux disease) 06/17/2014  . Hx of colonic polyps 12/15/2012  . Vitamin B12 deficiency 11/03/2012  . Intrinsic asthma 03/23/2012  . Chronic diastolic heart failure ([place]) 02/20/2011  . OSA (obstructive sleep apnea) 09/17/2010  . URINARY  URGENCY 01/08/2010  . CAD, NATIVE VESSEL 11/20/2008  . Osteoarthritis of knees, bilateral 06/14/2008  . Hyperlipidemia 05/10/2007  . Essential hypertension 01/18/2007  . Hypokalemia 04/30/2006  . HX, PERSONAL, PEPTIC ULCER DISEASE 04/30/2006    Current Outpatient Medications on File Prior to Visit  Medication Sig Dispense Refill  . acetaminophen (TYLENOL) 325 MG tablet Take 2 tablets (650 mg total) by mouth every 6 (six) hours  as needed for moderate pain (or Fever >/= 101). 20 tablet 0  . allopurinol (ZYLOPRIM) 100 MG tablet Take 1 tablet (100 mg total) by mouth daily. 90 tablet 1  . aspirin (ECOTRIN LOW STRENGTH) 81 MG EC tablet Take 81 mg by mouth at bedtime.    . Budesonide (PULMICORT FLEXHALER) 90 MCG/ACT inhaler Inhale 2 puffs into the lungs 2 (two) times daily.    . budesonide-formoterol (SYMBICORT) 160-4.5 MCG/ACT inhaler one - two inhalations every 12 hours; gargle and spit after use (Patient taking differently: Inhale 2 puffs into the lungs 2 (two) times daily. gargle and spit after use) 1 Inhaler 4  . calcium carbonate (TUMS - DOSED IN MG ELEMENTAL CALCIUM) 500 MG chewable tablet Chew 2 tablets by mouth daily as needed for indigestion or heartburn.    . carvedilol (COREG) 12.5 MG tablet TAKE 1 TABLET(12.5 MG) BY MOUTH TWICE DAILY WITH A MEAL 180 tablet 0  . cholecalciferol (VITAMIN D3) 25 MCG (1000 UT) tablet Take 1,000 Units by mouth daily.    . COD LIVER OIL PO Take 1 capsule by mouth daily.    . colchicine 0.6 MG tablet Take 0.5 tablets (0.3 mg total) by mouth daily. 90 tablet 1  . cyanocobalamin (,VITAMIN B-12,) 1000 MCG/ML injection INJECT 1ML INTO MUSCLE EVERY 30 DAYS (Patient taking differently: Inject 1,000 mcg into the muscle every 30 (thirty) days.) 10 mL 0  . diclofenac sodium (VOLTAREN) 1 % GEL Apply 2 g topically 4 (four) times daily. 100 g 11  . EPINEPHrine 0.3 mg/0.3 mL IJ SOAJ injection epinephrine 0.3 mg/0.3 mL injection, auto-injector  INJECT 0.3 MLS INTO MUSCLE ONCE AS NEEDED FOR ANAPHYLAXIS    . estradiol (ESTRACE) 0.1 MG/GM vaginal cream Place 1 Applicatorful vaginally at bedtime. 42.5 g 12  . famotidine (PEPCID) 20 MG tablet Take 1 tablet (20 mg total) by mouth 2 (two) times daily. 180 tablet 3  . ferrous sulfate 325 (65 FE) MG tablet Take 1 tablet (325 mg total) by mouth every Monday, Wednesday, and Friday. 60 tablet 0  . folic acid (FOLVITE) 161 MCG tablet Take 400 mcg by mouth at  bedtime.     . furosemide (LASIX) 80 MG tablet TAKE 1 TABLET(80 MG) BY MOUTH TWICE DAILY 180 tablet 0  . hydrocortisone (ANUSOL-HC) 2.5 % rectal cream Place 1 application rectally 2 (two) times daily. 30 g 0  . hydrOXYzine (ATARAX/VISTARIL) 25 MG tablet Take 1 tablet (25 mg total) by mouth 3 (three) times daily as needed for itching. 30 tablet 0  . lactobacillus acidophilus (BACID) TABS tablet Take 2 tablets by mouth 3 (three) times daily. 30 tablet 0  . montelukast (SINGULAIR) 10 MG tablet Take 1 tablet (10 mg total) by mouth at bedtime. 30 tablet 5  . Oxcarbazepine (TRILEPTAL) 300 MG tablet Take 1 tablet (300 mg total) by mouth 2 (two) times daily. 60 tablet 0  . permethrin (ELIMITE) 5 % cream permethrin 5 % topical cream  APPLY TWICE DAILY AS DIRECTED    . polyethylene glycol (MIRALAX / GLYCOLAX) 17 g packet Take 17 g  by mouth daily. 14 each 0  . potassium chloride SA (KLOR-CON) 20 MEQ tablet Take 20 mEq by mouth 3 (three) times daily.    Marland Kitchen senna-docusate (SENOKOT-S) 8.6-50 MG tablet Take 1 tablet by mouth at bedtime as needed for mild constipation. 30 tablet 0  . silver sulfADIAZINE (SILVADENE) 1 % cream Apply 1 application topically daily. 400 g 0  . simvastatin (ZOCOR) 40 MG tablet Take 1 tablet (40 mg total) by mouth at bedtime. 90 tablet 1  . spironolactone (ALDACTONE) 25 MG tablet Take 1 tablet (25 mg total) by mouth daily. 90 tablet 0  . triamcinolone cream (KENALOG) 0.1 %     . VENTOLIN HFA 108 (90 Base) MCG/ACT inhaler     . gabapentin (NEURONTIN) 100 MG capsule Take 1 capsule (100 mg total) by mouth 3 (three) times daily. 90 capsule 0   No current facility-administered medications on file prior to visit.    Past Medical History:  Diagnosis Date  . Anemia   . Asthma   . CAD (coronary artery disease)   . Carpal tunnel syndrome   . Cellulitis of both lower extremities 04/11/2015  . CHF (congestive heart failure) ([place])   . Chronic kidney disease    [facility]- followed by [facility]  . Colon polyp, hyperplastic 2007 & 2012  . Complication of anesthesia 1999   svt with renal calculi surgery, no problems since  . CTS (carpal tunnel syndrome)    bilateral  . Diabetes mellitus   . Eczema   . GERD (gastroesophageal reflux disease)   . History of kidney stones 1999  . Hyperlipidemia   . Hypertension   . Leg ulcer ([place]) 04/24/2015   Right lateral leg No evidence of an infection Monitor closely Keep edema controlled   . Leg ulcer ([place])    right lower  . Meralgia paresthetica    Dr. [name]  . Morbid obesity ([place])   . Morbid obesity ([place])   . Neuropathy    toes and legs  . Osteoarthrosis, unspecified whether generalized or localized, lower leg    knee  . PUD (peptic ulcer disease)   . Shortness of breath dyspnea    with exertion  . Sleep apnea    per progress note 02/25/2018  . Type II or unspecified type diabetes mellitus without mention of complication, not stated as uncontrolled   . Unspecified hereditary and idiopathic peripheral neuropathy   . Urticaria   . Vitamin B12 deficiency   . Wears glasses   . Wound cellulitis    right upper leg, healing well    Past Surgical History:  Procedure Laterality Date  . ABDOMINAL HYSTERECTOMY    . BREAST BIOPSY Right 07/08/2018  . CARDIAC CATHETERIZATION  2002   non obstructive disease  . colonoscopy with polypectomy  2007 & 2012    hyperplastic ;Dr Watt Climes  . COLONOSCOPY WITH PROPOFOL N/A 06/04/2015   Procedure: COLONOSCOPY WITH PROPOFOL;  Surgeon: Jerene Bears, MD;  Location: [facility];  Service: Gastroenterology;  Laterality: N/A;  . DEBRIDEMENT LEG Right 03/02/2018   WOUND VAC APPLIED  . DEBRIDEMENT LEG Right 05/27/2018   RIGHT LOWER LEG DEBRIDEMENT,  SKIN GRAFT, VAC PLACEMENT   . DILATION AND CURETTAGE OF UTERUS     multiple  . HEMORRHOID SURGERY    . I & D EXTREMITY Right 03/02/2018   Procedure: RIGHT LEG DEBRIDEMENT AND PLACE VAC;  Surgeon: [name], MD;  Location: [facility];  Service: Orthopedics;   Laterality: Right;  .  I & D EXTREMITY Right 03/04/2018   Procedure: REPEAT IRRIGATION AND DEBRIDEMENT RIGHT LEG, PLACE WOUND VAC;  Surgeon: [name], MD;  Location: [facility];  Service: Orthopedics;  Laterality: Right;  . I & D EXTREMITY Right 03/30/2018   Procedure: IRRIGATION AND DEBRIDEMENT RIGHT LEG, APPLY WOUND VAC;  Surgeon: [name], MD;  Location: [facility];  Service: Orthopedics;  Laterality: Right;  . I & D EXTREMITY Right 05/27/2018   Procedure: RIGHT LOWER LEG DEBRIDEMENT,  SKIN GRAFT, VAC PLACEMENT;  Surgeon: [name], MD;  Location: [facility];  Service: Orthopedics;  Laterality: Right;  RIGHT LOWER LEG DEBRIDEMENT,  SKIN GRAFT, VAC PLACEMENT  . renal calculi  12/1997   SVT with induction of anesthesia  . right knee arthroscopy    . SKIN SPLIT GRAFT Right 03/04/2018   Procedure: POSSIBLE SPLIT THICKNESS SKIN GRAFT;  Surgeon: [name], MD;  Location: [facility];  Service: Orthopedics;  Laterality: Right;  . SKIN SPLIT GRAFT Right 04/01/2018   Procedure: REPEAT IRRIGATION AND DEBRIDEMENT RIGHT LEG, APPLY SPLIT THICKNESS SKIN GRAFT;  Surgeon: [name], MD;  Location: [facility];  Service: Orthopedics;  Laterality: Right;  . TONSILLECTOMY AND ADENOIDECTOMY      Social History   Socioeconomic History  . Marital status: Married    Spouse name: [name]  . Number of children: 1  . Years of education: BS  . Highest education level: Not on file  Occupational History  . Occupation: Disabled    Employer: RETIRED  Tobacco Use  . Smoking status: Never Smoker  . Smokeless tobacco: Never Used  Vaping Use  . Vaping Use: Never used  Substance and Sexual Activity  . Alcohol use: No    Alcohol/week: 0.0 standard drinks  . Drug use: No  . Sexual activity: Not on file  Other Topics Concern  . Not on file  Social History Narrative   Patient lives at home with her husband [name] Patient is retired and has a Conservation officer, nature.    Caffeine - some times.   Right handed.    Social Determinants of Health   Financial Resource Strain: Not on file  Food Insecurity: Not on file  Transportation Needs: Not on file  Physical Activity: Not on file  Stress: Not on file  Social Connections: Not on file    Family History  Problem Relation Age of Onset  . Colon cancer Mother   . Prostate cancer Father   . Colon cancer Father   . Diabetes Maternal Aunt   . Breast cancer Maternal Aunt   . Diabetes Maternal Uncle   . Diabetes Paternal Aunt   . Stroke Paternal Aunt        > 65  . Heart disease Paternal Aunt   . Diabetes Paternal Uncle   . Breast cancer Maternal Aunt         X 2  . Breast cancer Cousin     Review of Systems  Constitutional: Negative for chills and fever.  Respiratory: Positive for cough (mild), shortness of breath (with exertion) and wheezing (using inhalers twice daily).   Cardiovascular: Positive for leg swelling. Negative for chest pain and palpitations.  Gastrointestinal: Negative for abdominal pain, blood in stool, constipation, diarrhea and nausea.  Neurological: Positive for light-headedness (with quick change in position) and headaches (occ).       Objective:   Vitals:   03/08/20 1303  BP: 140/76  Pulse: 64  Temp: 98.2 F (36.8  C)  SpO2: 94%   BP Readings from Last 3 Encounters:  03/08/20 140/76  10/20/19 (!) 142/80  03/01/19 (!) 105/59   Wt Readings from Last 3 Encounters:  10/20/19 (!) 340 lb (154.2 kg)  09/04/19 (!) 340 lb (154.2 kg)  08/14/19 (!) 340 lb (154.2 kg)   Body mass index is 58.36 kg/m.   Physical Exam    Constitutional: Appears well-developed and well-nourished. No distress.  HENT:  Head: Normocephalic and atraumatic.  Neck: Neck supple. No tracheal deviation present. No thyromegaly present.  No cervical lymphadenopathy Cardiovascular: Normal rate, regular rhythm and normal heart sounds.   No murmur heard. No carotid bruit .  Trace edema left lower extremity, 1+ pitting right lower extremity  edema below scar Pulmonary/Chest: Effort normal and breath sounds normal. No respiratory distress. No has no wheezes. No rales. Abdomen: Obese, soft, nontender Skin: Skin is warm and dry. Not diaphoretic.  Psychiatric: Normal mood and affect. Behavior is normal.      Assessment & Plan:    Patient suffers from peripheral neuropathy, chronic knee pain which impairs her ability to perform daily activities like bathing, dressing, feeding, grooming and toileting in the home.  A cane or walker will not resolve issue with performing activities of daily living. A wheelchair will allow patient to safely perform daily activities. Patient can safely propel the wheelchair in the home or has a caregiver who can provide assistance. Length of need Lifetime.   See Problem List for Assessment and Plan of chronic medical problems.    This visit occurred during the SARS-CoV-2 public health emergency.  Safety protocols were in place, including screening questions prior to the visit, additional usage of staff PPE, and extensive cleaning of exam room while observing appropriate contact time as indicated for disinfecting solutions.

## 2020-03-07 NOTE — Telephone Encounter (Signed)
Darlene w/ [facility] called and said that the patient has delayed the start date to 03/11/20.

## 2020-03-08 ENCOUNTER — Ambulatory Visit: Payer: Medicare PPO | Admitting: Internal Medicine

## 2020-03-08 ENCOUNTER — Encounter: Payer: Self-pay | Admitting: Internal Medicine

## 2020-03-08 ENCOUNTER — Other Ambulatory Visit: Payer: Self-pay

## 2020-03-08 ENCOUNTER — Telehealth: Payer: Self-pay

## 2020-03-08 VITALS — BP 140/76 | HR 64 | Temp 98.2°F | Ht 64.0 in

## 2020-03-08 DIAGNOSIS — I10 Essential (primary) hypertension: Secondary | ICD-10-CM | POA: Diagnosis not present

## 2020-03-08 DIAGNOSIS — K219 Gastro-esophageal reflux disease without esophagitis: Secondary | ICD-10-CM | POA: Diagnosis not present

## 2020-03-08 DIAGNOSIS — E1142 Type 2 diabetes mellitus with diabetic polyneuropathy: Secondary | ICD-10-CM

## 2020-03-08 DIAGNOSIS — E1140 Type 2 diabetes mellitus with diabetic neuropathy, unspecified: Secondary | ICD-10-CM | POA: Diagnosis not present

## 2020-03-08 DIAGNOSIS — G609 Hereditary and idiopathic neuropathy, unspecified: Secondary | ICD-10-CM

## 2020-03-08 DIAGNOSIS — M170 Bilateral primary osteoarthritis of knee: Secondary | ICD-10-CM | POA: Diagnosis not present

## 2020-03-08 DIAGNOSIS — N183 Chronic kidney disease, stage 3 unspecified: Secondary | ICD-10-CM

## 2020-03-08 DIAGNOSIS — I5032 Chronic diastolic (congestive) heart failure: Secondary | ICD-10-CM | POA: Diagnosis not present

## 2020-03-08 LAB — CBC WITH DIFFERENTIAL/PLATELET
Basophils Absolute: 0.1 10*3/uL (ref 0.0–0.1)
Basophils Relative: 1.4 % (ref 0.0–3.0)
Eosinophils Absolute: 0.1 10*3/uL (ref 0.0–0.7)
Eosinophils Relative: 1.8 % (ref 0.0–5.0)
HCT: 43 % (ref 36.0–46.0)
Hemoglobin: 13.6 g/dL (ref 12.0–15.0)
Lymphocytes Relative: 23.8 % (ref 12.0–46.0)
Lymphs Abs: 1 10*3/uL (ref 0.7–4.0)
MCHC: 31.7 g/dL (ref 30.0–36.0)
MCV: 96.4 fl (ref 78.0–100.0)
Monocytes Absolute: 0.4 10*3/uL (ref 0.1–1.0)
Monocytes Relative: 11 % (ref 3.0–12.0)
Neutro Abs: 2.5 10*3/uL (ref 1.4–7.7)
Neutrophils Relative %: 62 % (ref 43.0–77.0)
Platelets: 146 10*3/uL — ABNORMAL LOW (ref 150.0–400.0)
RBC: 4.46 Mil/uL (ref 3.87–5.11)
RDW: 16 % — ABNORMAL HIGH (ref 11.5–15.5)
WBC: 4 10*3/uL (ref 4.0–10.5)

## 2020-03-08 LAB — COMPREHENSIVE METABOLIC PANEL
ALT: 17 U/L (ref 0–35)
AST: 26 U/L (ref 0–37)
Albumin: 4.2 g/dL (ref 3.5–5.2)
Alkaline Phosphatase: 71 U/L (ref 39–117)
BUN: 34 mg/dL — ABNORMAL HIGH (ref 6–23)
CO2: 33 mEq/L — ABNORMAL HIGH (ref 19–32)
Calcium: 10 mg/dL (ref 8.4–10.5)
Chloride: 101 mEq/L (ref 96–112)
Creatinine, Ser: 1.7 mg/dL — ABNORMAL HIGH (ref 0.40–1.20)
GFR: 30.36 mL/min — ABNORMAL LOW (ref >60.00)
Glucose, Bld: 98 mg/dL (ref 70–99)
Potassium: 4.3 mEq/L (ref 3.5–5.1)
Sodium: 140 mEq/L (ref 135–145)
Total Bilirubin: 0.6 mg/dL (ref 0.2–1.2)
Total Protein: 7.3 g/dL (ref 6.0–8.3)

## 2020-03-08 LAB — LIPID PANEL
Cholesterol: 176 mg/dL (ref 0–200)
HDL: 45.3 mg/dL (ref >39.00)
LDL Cholesterol: 113 mg/dL — ABNORMAL HIGH (ref 0–99)
NonHDL: 130.25
Total CHOL/HDL Ratio: 4
Triglycerides: 88 mg/dL (ref 0.0–149.0)
VLDL: 17.6 mg/dL (ref 0.0–40.0)

## 2020-03-08 LAB — HEMOGLOBIN A1C: Hgb A1c MFr Bld: 6.4 % (ref 4.6–6.5)

## 2020-03-08 LAB — TSH: TSH: 2.95 u[IU]/mL (ref 0.35–4.50)

## 2020-03-08 MED ORDER — OXYCODONE HCL 5 MG PO TABS: 5.0000 mg | ORAL_TABLET | Freq: Four times a day (QID) | ORAL | 0 refills | Status: DC | PRN

## 2020-03-08 NOTE — Assessment & Plan Note (Signed)
Chronic Idiopathic versus diabetic Severe in nature Taking gabapentin 100 mg 3 times daily Taking oxycodone IR 5 mg every 6 hours as needed Taking oxcarbazepine 300 mg twice daily

## 2020-03-08 NOTE — Assessment & Plan Note (Signed)
Chronic Diet controlled Check A1c  

## 2020-03-08 NOTE — Assessment & Plan Note (Signed)
Chronic BP reasonably controlled Continue carvedilol 12.5 mg twice daily, furosemide 80 mg twice daily, spironolactone 25 mg daily cmp

## 2020-03-08 NOTE — Telephone Encounter (Signed)
rx sent.   Please call Monday - let her know I can only send in a small supply initially.      Was she taking this regularly in the rehab facility?    We will need to address the pain medication at her next visit.  I would like to see her back in 2 months instead of 6 months.

## 2020-03-08 NOTE — Addendum Note (Signed)
Addended by: [name] on: 03/08/2020 04:51 PM   Modules accepted: Orders

## 2020-03-08 NOTE — Patient Instructions (Addendum)
    Blood work was ordered.     Medications changes include :  none   Your prescription(s) have been submitted to your pharmacy. Please take as directed and contact our office if you believe you are having problem(s) with the medication(s).   Please followup in 6 months  

## 2020-03-08 NOTE — Assessment & Plan Note (Signed)
Chronic Severe Taking oxycodone IR 5 mg Q 6 hrs prn now - will continue for short term Refilled today

## 2020-03-08 NOTE — Telephone Encounter (Signed)
She has two different pain meds on her list.  Which one.

## 2020-03-08 NOTE — Assessment & Plan Note (Signed)
Chronic Mild leg edema on right > left but appears to be euvolemic Continue lasix 80 mg BID  cmp

## 2020-03-08 NOTE — Assessment & Plan Note (Signed)
Chronic taking tums and pepcid as needed Ok to take as needed, but advised if having frequent gerd she should take pepcid daily

## 2020-03-08 NOTE — Assessment & Plan Note (Signed)
Chronic cmp 

## 2020-03-08 NOTE — Assessment & Plan Note (Signed)
Chronic Severe in nature Taking gabapentin 100 mg 3 times daily Taking oxycodone IR 5 mg every 6 hours as needed Taking oxcarbazepine 300 mg twice daily

## 2020-03-10 DIAGNOSIS — I509 Heart failure, unspecified: Secondary | ICD-10-CM | POA: Diagnosis not present

## 2020-03-10 DIAGNOSIS — I2510 Atherosclerotic heart disease of native coronary artery without angina pectoris: Secondary | ICD-10-CM | POA: Diagnosis not present

## 2020-03-10 DIAGNOSIS — N183 Chronic kidney disease, stage 3 unspecified: Secondary | ICD-10-CM | POA: Diagnosis not present

## 2020-03-10 DIAGNOSIS — M1A0710 Idiopathic chronic gout, right ankle and foot, without tophus (tophi): Secondary | ICD-10-CM | POA: Diagnosis not present

## 2020-03-10 DIAGNOSIS — E1142 Type 2 diabetes mellitus with diabetic polyneuropathy: Secondary | ICD-10-CM | POA: Diagnosis not present

## 2020-03-10 DIAGNOSIS — I130 Hypertensive heart and chronic kidney disease with heart failure and stage 1 through stage 4 chronic kidney disease, or unspecified chronic kidney disease: Secondary | ICD-10-CM | POA: Diagnosis not present

## 2020-03-10 DIAGNOSIS — E1122 Type 2 diabetes mellitus with diabetic chronic kidney disease: Secondary | ICD-10-CM | POA: Diagnosis not present

## 2020-03-10 DIAGNOSIS — J449 Chronic obstructive pulmonary disease, unspecified: Secondary | ICD-10-CM | POA: Diagnosis not present

## 2020-03-11 ENCOUNTER — Telehealth: Payer: Self-pay | Admitting: Internal Medicine

## 2020-03-11 NOTE — Telephone Encounter (Signed)
[place] for [employer].   [place] for monthly B12 injection by them.  Ok to fax updated med list

## 2020-03-11 NOTE — Telephone Encounter (Signed)
Appointment made

## 2020-03-11 NOTE — Telephone Encounter (Signed)
April [name] w/ [facility] is requesting for verbals for nursing for 1w4, 24m1, 1 prn for medication and disease management. She was also wondering if they could give the patient her monthly B12 injection. She was also wondering if an updated medication list be faxed to [phone]

## 2020-03-12 ENCOUNTER — Other Ambulatory Visit: Payer: Self-pay

## 2020-03-12 ENCOUNTER — Telehealth: Payer: Self-pay | Admitting: Internal Medicine

## 2020-03-12 DIAGNOSIS — E1140 Type 2 diabetes mellitus with diabetic neuropathy, unspecified: Secondary | ICD-10-CM

## 2020-03-12 DIAGNOSIS — L97919 Non-pressure chronic ulcer of unspecified part of right lower leg with unspecified severity: Secondary | ICD-10-CM

## 2020-03-12 DIAGNOSIS — G609 Hereditary and idiopathic neuropathy, unspecified: Secondary | ICD-10-CM

## 2020-03-12 DIAGNOSIS — M170 Bilateral primary osteoarthritis of knee: Secondary | ICD-10-CM

## 2020-03-12 MED ORDER — VITAMIN D 25 MCG (1000 UNIT) PO TABS: 1000.0000 [IU] | ORAL_TABLET | Freq: Every day | ORAL | 1 refills | Status: DC

## 2020-03-12 MED ORDER — MONTELUKAST SODIUM 10 MG PO TABS: 10.0000 mg | ORAL_TABLET | Freq: Every day | ORAL | 5 refills | Status: DC

## 2020-03-12 MED ORDER — FOLIC ACID 400 MCG PO TABS: 400.0000 ug | ORAL_TABLET | Freq: Every day | ORAL | 1 refills | Status: DC

## 2020-03-12 NOTE — Telephone Encounter (Signed)
Printed

## 2020-03-12 NOTE — Telephone Encounter (Signed)
Spoke with April [name] and verbals given.  Med list faxed today as well.

## 2020-03-12 NOTE — Telephone Encounter (Signed)
Sent in today for patient. 

## 2020-03-12 NOTE — Telephone Encounter (Signed)
Patient calling back, states that [employer] just called her and told her that they need an order from the doctor for the bariatric toilet seat, not the one where the arm lifts up to slide in.  Fax#[phone]

## 2020-03-12 NOTE — Telephone Encounter (Signed)
printed

## 2020-03-12 NOTE — Telephone Encounter (Signed)
Patient calling stating she spoke with carla and she asked her what medications she currently needed refills on  folic acid (FOLVITE) 735 MCG tablet b12 injections and syringes cholecalciferol (VITAMIN D3) 25 MCG (1000 UT) tablet montelukast (SINGULAIR) 10 MG tablet [facility] [phone] - [place], [address] AT [place] Phone:  [phone]  Fax:  [phone]

## 2020-03-12 NOTE — Telephone Encounter (Addendum)
   Patient calling to follow up on a request for a bariatric bedside commode and a bariatric mattress   Patient states she cant get a return call from [facility] [phone])

## 2020-03-13 ENCOUNTER — Telehealth: Payer: Self-pay | Admitting: Internal Medicine

## 2020-03-13 DIAGNOSIS — N183 Chronic kidney disease, stage 3 unspecified: Secondary | ICD-10-CM | POA: Diagnosis not present

## 2020-03-13 DIAGNOSIS — E1122 Type 2 diabetes mellitus with diabetic chronic kidney disease: Secondary | ICD-10-CM | POA: Diagnosis not present

## 2020-03-13 DIAGNOSIS — J449 Chronic obstructive pulmonary disease, unspecified: Secondary | ICD-10-CM | POA: Diagnosis not present

## 2020-03-13 DIAGNOSIS — E1142 Type 2 diabetes mellitus with diabetic polyneuropathy: Secondary | ICD-10-CM | POA: Diagnosis not present

## 2020-03-13 DIAGNOSIS — I509 Heart failure, unspecified: Secondary | ICD-10-CM | POA: Diagnosis not present

## 2020-03-13 DIAGNOSIS — I2510 Atherosclerotic heart disease of native coronary artery without angina pectoris: Secondary | ICD-10-CM | POA: Diagnosis not present

## 2020-03-13 DIAGNOSIS — I130 Hypertensive heart and chronic kidney disease with heart failure and stage 1 through stage 4 chronic kidney disease, or unspecified chronic kidney disease: Secondary | ICD-10-CM | POA: Diagnosis not present

## 2020-03-13 DIAGNOSIS — M1A0710 Idiopathic chronic gout, right ankle and foot, without tophus (tophi): Secondary | ICD-10-CM | POA: Diagnosis not present

## 2020-03-13 NOTE — Telephone Encounter (Signed)
April [name] w/ [facility] is requesting PT verbals for 1w8.

## 2020-03-13 NOTE — Telephone Encounter (Signed)
ok 

## 2020-03-13 NOTE — Telephone Encounter (Signed)
Verbals given today. °

## 2020-03-14 ENCOUNTER — Other Ambulatory Visit: Payer: Self-pay

## 2020-03-14 DIAGNOSIS — M6281 Muscle weakness (generalized): Secondary | ICD-10-CM | POA: Diagnosis not present

## 2020-03-14 DIAGNOSIS — E1142 Type 2 diabetes mellitus with diabetic polyneuropathy: Secondary | ICD-10-CM | POA: Diagnosis not present

## 2020-03-14 DIAGNOSIS — I509 Heart failure, unspecified: Secondary | ICD-10-CM | POA: Diagnosis not present

## 2020-03-14 DIAGNOSIS — E1122 Type 2 diabetes mellitus with diabetic chronic kidney disease: Secondary | ICD-10-CM | POA: Diagnosis not present

## 2020-03-14 DIAGNOSIS — E661 Drug-induced obesity: Secondary | ICD-10-CM | POA: Diagnosis not present

## 2020-03-14 DIAGNOSIS — I2510 Atherosclerotic heart disease of native coronary artery without angina pectoris: Secondary | ICD-10-CM | POA: Diagnosis not present

## 2020-03-14 DIAGNOSIS — M1A0710 Idiopathic chronic gout, right ankle and foot, without tophus (tophi): Secondary | ICD-10-CM | POA: Diagnosis not present

## 2020-03-14 DIAGNOSIS — R609 Edema, unspecified: Secondary | ICD-10-CM | POA: Diagnosis not present

## 2020-03-14 DIAGNOSIS — N183 Chronic kidney disease, stage 3 unspecified: Secondary | ICD-10-CM | POA: Diagnosis not present

## 2020-03-14 DIAGNOSIS — I130 Hypertensive heart and chronic kidney disease with heart failure and stage 1 through stage 4 chronic kidney disease, or unspecified chronic kidney disease: Secondary | ICD-10-CM | POA: Diagnosis not present

## 2020-03-14 DIAGNOSIS — J449 Chronic obstructive pulmonary disease, unspecified: Secondary | ICD-10-CM | POA: Diagnosis not present

## 2020-03-14 MED ORDER — CYANOCOBALAMIN 1000 MCG/ML IJ SOLN: INTRAMUSCULAR | 0 refills | Status: DC

## 2020-03-18 DIAGNOSIS — I130 Hypertensive heart and chronic kidney disease with heart failure and stage 1 through stage 4 chronic kidney disease, or unspecified chronic kidney disease: Secondary | ICD-10-CM | POA: Diagnosis not present

## 2020-03-18 DIAGNOSIS — E1122 Type 2 diabetes mellitus with diabetic chronic kidney disease: Secondary | ICD-10-CM | POA: Diagnosis not present

## 2020-03-18 DIAGNOSIS — J449 Chronic obstructive pulmonary disease, unspecified: Secondary | ICD-10-CM | POA: Diagnosis not present

## 2020-03-18 DIAGNOSIS — M1A0710 Idiopathic chronic gout, right ankle and foot, without tophus (tophi): Secondary | ICD-10-CM | POA: Diagnosis not present

## 2020-03-18 DIAGNOSIS — I509 Heart failure, unspecified: Secondary | ICD-10-CM | POA: Diagnosis not present

## 2020-03-18 DIAGNOSIS — I2510 Atherosclerotic heart disease of native coronary artery without angina pectoris: Secondary | ICD-10-CM | POA: Diagnosis not present

## 2020-03-18 DIAGNOSIS — E1142 Type 2 diabetes mellitus with diabetic polyneuropathy: Secondary | ICD-10-CM | POA: Diagnosis not present

## 2020-03-18 DIAGNOSIS — N183 Chronic kidney disease, stage 3 unspecified: Secondary | ICD-10-CM | POA: Diagnosis not present

## 2020-03-18 NOTE — Telephone Encounter (Signed)
Darlene w/ [facility] is requesting the office notes from the face to face on 2.4.22. Please advise   Fax: [phone]

## 2020-03-18 NOTE — Telephone Encounter (Signed)
Faxed today

## 2020-03-21 DIAGNOSIS — I2510 Atherosclerotic heart disease of native coronary artery without angina pectoris: Secondary | ICD-10-CM | POA: Diagnosis not present

## 2020-03-21 DIAGNOSIS — J449 Chronic obstructive pulmonary disease, unspecified: Secondary | ICD-10-CM | POA: Diagnosis not present

## 2020-03-21 DIAGNOSIS — E1122 Type 2 diabetes mellitus with diabetic chronic kidney disease: Secondary | ICD-10-CM | POA: Diagnosis not present

## 2020-03-21 DIAGNOSIS — E1142 Type 2 diabetes mellitus with diabetic polyneuropathy: Secondary | ICD-10-CM | POA: Diagnosis not present

## 2020-03-21 DIAGNOSIS — I509 Heart failure, unspecified: Secondary | ICD-10-CM | POA: Diagnosis not present

## 2020-03-21 DIAGNOSIS — N183 Chronic kidney disease, stage 3 unspecified: Secondary | ICD-10-CM | POA: Diagnosis not present

## 2020-03-21 DIAGNOSIS — I130 Hypertensive heart and chronic kidney disease with heart failure and stage 1 through stage 4 chronic kidney disease, or unspecified chronic kidney disease: Secondary | ICD-10-CM | POA: Diagnosis not present

## 2020-03-21 DIAGNOSIS — M1A0710 Idiopathic chronic gout, right ankle and foot, without tophus (tophi): Secondary | ICD-10-CM | POA: Diagnosis not present

## 2020-03-22 ENCOUNTER — Ambulatory Visit: Payer: Medicare PPO

## 2020-03-25 ENCOUNTER — Telehealth: Payer: Self-pay | Admitting: Internal Medicine

## 2020-03-25 DIAGNOSIS — I2510 Atherosclerotic heart disease of native coronary artery without angina pectoris: Secondary | ICD-10-CM | POA: Diagnosis not present

## 2020-03-25 DIAGNOSIS — I130 Hypertensive heart and chronic kidney disease with heart failure and stage 1 through stage 4 chronic kidney disease, or unspecified chronic kidney disease: Secondary | ICD-10-CM | POA: Diagnosis not present

## 2020-03-25 DIAGNOSIS — M1A0710 Idiopathic chronic gout, right ankle and foot, without tophus (tophi): Secondary | ICD-10-CM | POA: Diagnosis not present

## 2020-03-25 DIAGNOSIS — I509 Heart failure, unspecified: Secondary | ICD-10-CM | POA: Diagnosis not present

## 2020-03-25 DIAGNOSIS — E1142 Type 2 diabetes mellitus with diabetic polyneuropathy: Secondary | ICD-10-CM | POA: Diagnosis not present

## 2020-03-25 DIAGNOSIS — N183 Chronic kidney disease, stage 3 unspecified: Secondary | ICD-10-CM | POA: Diagnosis not present

## 2020-03-25 DIAGNOSIS — J449 Chronic obstructive pulmonary disease, unspecified: Secondary | ICD-10-CM | POA: Diagnosis not present

## 2020-03-25 DIAGNOSIS — E1122 Type 2 diabetes mellitus with diabetic chronic kidney disease: Secondary | ICD-10-CM | POA: Diagnosis not present

## 2020-03-25 NOTE — Telephone Encounter (Signed)
[name] w/ [facility] called and was wondering how the patient was supposed to be taking furosemide (LASIX) 80 MG tablet. She can be reached at [phone]

## 2020-03-26 ENCOUNTER — Encounter: Payer: Self-pay | Admitting: Podiatry

## 2020-03-26 ENCOUNTER — Ambulatory Visit: Payer: Medicare PPO | Admitting: Orthotics

## 2020-03-26 ENCOUNTER — Other Ambulatory Visit: Payer: Self-pay

## 2020-03-26 ENCOUNTER — Ambulatory Visit (INDEPENDENT_AMBULATORY_CARE_PROVIDER_SITE_OTHER): Payer: Medicare PPO | Admitting: Podiatry

## 2020-03-26 DIAGNOSIS — E1142 Type 2 diabetes mellitus with diabetic polyneuropathy: Secondary | ICD-10-CM | POA: Diagnosis not present

## 2020-03-26 DIAGNOSIS — M2142 Flat foot [pes planus] (acquired), left foot: Secondary | ICD-10-CM

## 2020-03-26 DIAGNOSIS — L03115 Cellulitis of right lower limb: Secondary | ICD-10-CM

## 2020-03-26 DIAGNOSIS — L84 Corns and callosities: Secondary | ICD-10-CM

## 2020-03-26 DIAGNOSIS — M2141 Flat foot [pes planus] (acquired), right foot: Secondary | ICD-10-CM

## 2020-03-26 DIAGNOSIS — I890 Lymphedema, not elsewhere classified: Secondary | ICD-10-CM

## 2020-03-26 DIAGNOSIS — B351 Tinea unguium: Secondary | ICD-10-CM

## 2020-03-26 NOTE — Telephone Encounter (Signed)
Message left for [place] today

## 2020-03-26 NOTE — Progress Notes (Signed)

## 2020-03-26 NOTE — Telephone Encounter (Signed)
cyanocobalamin (,VITAMIN B-12,) 1000 MCG/ML injection They went to the pharmacy to pick this up and the pharmacy said they never received it

## 2020-03-27 ENCOUNTER — Other Ambulatory Visit: Payer: Self-pay

## 2020-03-27 NOTE — Progress Notes (Signed)
No notes seen   Sent via other phone note

## 2020-03-27 NOTE — Telephone Encounter (Addendum)
Please send order for syringes to administer the B12 Medication is at the pharmacy the needles are not  Thank you

## 2020-03-28 MED ORDER — "BD LUER-LOK SYRINGE 23G X 1-1/2"" 3 ML MISC": 1 refills | Status: DC

## 2020-03-28 NOTE — Addendum Note (Signed)
Addended by: [name] on: 03/28/2020 12:40 PM   Modules accepted: Orders

## 2020-03-30 NOTE — Progress Notes (Signed)
Subjective:  Patient ID: April [name], female    DOB: 07/16/1950,  MRN: [id number]  70 y.o. female presents with preventative diabetic foot care and painful thick toenails that are difficult to trim. Pain interferes with ambulation. Aggravating factors include wearing enclosed shoe gear. Pain is relieved with periodic professional debridement.Marland Kitchen    PCP: [name], MD.Last visit 03/08/2020.  She states she is glad to be back home with her husband.   Review of Systems: Negative except as noted in the HPI.   Allergies  Allergen Reactions  . Penicillins Rash and Other (See Comments)    She was told not to take it anymore. Has patient had a PCN reaction causing immediate rash, facial/tongue/throat swelling, SOB or lightheadedness with hypotension: Yes Has patient had a PCN reaction causing severe rash involving mucus membranes or skin necrosis: No Has patient had a PCN reaction that required hospitalization: No Has patient had a PCN reaction occurring within the last 10 years: No If all of the above answers are "NO", then may proceed with Cephalosporin use.'  . Shellfish Allergy Anaphylaxis  . Sulfonamide Derivatives Anaphylaxis and Other (See Comments)  . Hydrochlorothiazide W-Triamterene Other (See Comments)    Hypokalemia  . Lotensin [Benazepril Hcl] Hives  . Dipyridamole Other (See Comments)    Unknown reaction  . Estrogens Other (See Comments)    Unknown reaction  . Hydrochlorothiazide Other (See Comments)    Unknown reaction  . Metronidazole Other (See Comments)    Unknown reaction  . Other Itching    PICKLES  . Spironolactone Other (See Comments)    UNSPECIFIED > "kidney problems"  . Sulfa Antibiotics Other (See Comments)    Unknown reaction  . Torsemide Other (See Comments)    Unknown reaction  . Valsartan Other (See Comments)    Unknown reaction  . Madelaine Bhat Isothiocyanate] Itching    Objective:  There were no vitals filed for this  visit. Constitutional Patient is a pleasant 70 y.o. African American female morbidly obese in NAD. AAO x 3.  Vascular Capillary refill time to digits <4 seconds b/l lower extremities. Nonpalpable DP pulse(s) b/l lower extremities. Nonpalpable PT pulse(s) b/l lower extremities. Pedal hair absent. Lower extremity skin temperature gradient within normal limits. Lymphedema present b/l lower extremities. No cyanosis or clubbing noted.  Neurologic Normal speech. Protective sensation diminished with 10g monofilament b/l.  Dermatologic Pedal skin with normal turgor, texture and tone bilaterally. No open wounds bilaterally. No interdigital macerations bilaterally. Toenails 1-5 b/l elongated, discolored, dystrophic, thickened, crumbly with subungual debris and tenderness to dorsal palpation.  Orthopedic: Normal muscle strength 5/5 to all lower extremity muscle groups bilaterally. Pes planus deformity noted b/l.  Utilizes wheelchair for mobility assistance.    Assessment:   1. Onychomycosis   2. Callus of heel   3. Pes planus of both feet   4. Lymphedema   5. Diabetic polyneuropathy associated with type 2 diabetes mellitus ([place])    Plan:  Patient was evaluated and treated and all questions answered.  Onychomycosis with pain -Nails palliatively debridement as below. -Educated on self-care  Procedure: Nail Debridement Rationale: Pain Type of Debridement: manual, sharp debridement. Instrumentation: Nail nipper, rotary burr. Number of Nails: 10  -Examined patient. -Continue diabetic foot care principles. -Patient to continue soft, supportive shoe gear daily. -Toenails 1-5 b/l were debrided in length and girth with sterile nail nippers and dremel without iatrogenic bleeding.  -Callus(es) plantar aspect of heel left lower extremity pared utilizing sterile scalpel blade without complication  or incident. Total number debrided =1. -Patient to report any pedal injuries to medical professional  immediately. -Patient/POA to call should there be question/concern in the interim.  Return in about 3 months (around 06/23/2020).  Marzetta Board, DPM

## 2020-04-01 ENCOUNTER — Telehealth: Payer: Self-pay | Admitting: Internal Medicine

## 2020-04-01 DIAGNOSIS — E1142 Type 2 diabetes mellitus with diabetic polyneuropathy: Secondary | ICD-10-CM | POA: Diagnosis not present

## 2020-04-01 DIAGNOSIS — I509 Heart failure, unspecified: Secondary | ICD-10-CM | POA: Diagnosis not present

## 2020-04-01 DIAGNOSIS — E1122 Type 2 diabetes mellitus with diabetic chronic kidney disease: Secondary | ICD-10-CM | POA: Diagnosis not present

## 2020-04-01 DIAGNOSIS — N183 Chronic kidney disease, stage 3 unspecified: Secondary | ICD-10-CM | POA: Diagnosis not present

## 2020-04-01 DIAGNOSIS — I130 Hypertensive heart and chronic kidney disease with heart failure and stage 1 through stage 4 chronic kidney disease, or unspecified chronic kidney disease: Secondary | ICD-10-CM | POA: Diagnosis not present

## 2020-04-01 DIAGNOSIS — J449 Chronic obstructive pulmonary disease, unspecified: Secondary | ICD-10-CM | POA: Diagnosis not present

## 2020-04-01 DIAGNOSIS — M1A0710 Idiopathic chronic gout, right ankle and foot, without tophus (tophi): Secondary | ICD-10-CM | POA: Diagnosis not present

## 2020-04-01 DIAGNOSIS — I2510 Atherosclerotic heart disease of native coronary artery without angina pectoris: Secondary | ICD-10-CM | POA: Diagnosis not present

## 2020-04-01 NOTE — Telephone Encounter (Signed)
Patient called and was wondering when she needed to stop taking furosemide (LASIX) 80 MG tablet 3 times daily.Or if she needs to go back to 2 times daily. Please advise. She can be reached at [phone].

## 2020-04-01 NOTE — Telephone Encounter (Signed)
Spoke with patient today. 

## 2020-04-01 NOTE — Telephone Encounter (Signed)
If her swelling is better she should go back to 1 twice daily.

## 2020-04-03 DIAGNOSIS — M5432 Sciatica, left side: Secondary | ICD-10-CM

## 2020-04-03 DIAGNOSIS — E785 Hyperlipidemia, unspecified: Secondary | ICD-10-CM

## 2020-04-03 DIAGNOSIS — I878 Other specified disorders of veins: Secondary | ICD-10-CM

## 2020-04-03 DIAGNOSIS — K219 Gastro-esophageal reflux disease without esophagitis: Secondary | ICD-10-CM

## 2020-04-03 DIAGNOSIS — M26622 Arthralgia of left temporomandibular joint: Secondary | ICD-10-CM | POA: Diagnosis not present

## 2020-04-03 DIAGNOSIS — E1142 Type 2 diabetes mellitus with diabetic polyneuropathy: Secondary | ICD-10-CM | POA: Diagnosis not present

## 2020-04-03 DIAGNOSIS — Z7952 Long term (current) use of systemic steroids: Secondary | ICD-10-CM

## 2020-04-03 DIAGNOSIS — R234 Changes in skin texture: Secondary | ICD-10-CM

## 2020-04-03 DIAGNOSIS — E1122 Type 2 diabetes mellitus with diabetic chronic kidney disease: Secondary | ICD-10-CM | POA: Diagnosis not present

## 2020-04-03 DIAGNOSIS — M1711 Unilateral primary osteoarthritis, right knee: Secondary | ICD-10-CM | POA: Diagnosis not present

## 2020-04-03 DIAGNOSIS — M170 Bilateral primary osteoarthritis of knee: Secondary | ICD-10-CM

## 2020-04-03 DIAGNOSIS — F32A Depression, unspecified: Secondary | ICD-10-CM

## 2020-04-03 DIAGNOSIS — I2510 Atherosclerotic heart disease of native coronary artery without angina pectoris: Secondary | ICD-10-CM | POA: Diagnosis not present

## 2020-04-03 DIAGNOSIS — I509 Heart failure, unspecified: Secondary | ICD-10-CM | POA: Diagnosis not present

## 2020-04-03 DIAGNOSIS — I130 Hypertensive heart and chronic kidney disease with heart failure and stage 1 through stage 4 chronic kidney disease, or unspecified chronic kidney disease: Secondary | ICD-10-CM | POA: Diagnosis not present

## 2020-04-03 DIAGNOSIS — J449 Chronic obstructive pulmonary disease, unspecified: Secondary | ICD-10-CM | POA: Diagnosis not present

## 2020-04-03 DIAGNOSIS — N183 Chronic kidney disease, stage 3 unspecified: Secondary | ICD-10-CM | POA: Diagnosis not present

## 2020-04-03 DIAGNOSIS — Z8616 Personal history of COVID-19: Secondary | ICD-10-CM

## 2020-04-03 DIAGNOSIS — M1A0710 Idiopathic chronic gout, right ankle and foot, without tophus (tophi): Secondary | ICD-10-CM | POA: Diagnosis not present

## 2020-04-03 DIAGNOSIS — R32 Unspecified urinary incontinence: Secondary | ICD-10-CM

## 2020-04-03 DIAGNOSIS — R159 Full incontinence of feces: Secondary | ICD-10-CM

## 2020-04-04 DIAGNOSIS — I130 Hypertensive heart and chronic kidney disease with heart failure and stage 1 through stage 4 chronic kidney disease, or unspecified chronic kidney disease: Secondary | ICD-10-CM | POA: Diagnosis not present

## 2020-04-04 DIAGNOSIS — J449 Chronic obstructive pulmonary disease, unspecified: Secondary | ICD-10-CM | POA: Diagnosis not present

## 2020-04-04 DIAGNOSIS — I2510 Atherosclerotic heart disease of native coronary artery without angina pectoris: Secondary | ICD-10-CM | POA: Diagnosis not present

## 2020-04-04 DIAGNOSIS — N183 Chronic kidney disease, stage 3 unspecified: Secondary | ICD-10-CM | POA: Diagnosis not present

## 2020-04-04 DIAGNOSIS — I509 Heart failure, unspecified: Secondary | ICD-10-CM | POA: Diagnosis not present

## 2020-04-04 DIAGNOSIS — E1142 Type 2 diabetes mellitus with diabetic polyneuropathy: Secondary | ICD-10-CM | POA: Diagnosis not present

## 2020-04-04 DIAGNOSIS — M1A0710 Idiopathic chronic gout, right ankle and foot, without tophus (tophi): Secondary | ICD-10-CM | POA: Diagnosis not present

## 2020-04-04 DIAGNOSIS — E1122 Type 2 diabetes mellitus with diabetic chronic kidney disease: Secondary | ICD-10-CM | POA: Diagnosis not present

## 2020-04-05 ENCOUNTER — Telehealth: Payer: Self-pay | Admitting: Internal Medicine

## 2020-04-05 NOTE — Telephone Encounter (Signed)
[name] from [facility] calling, requesting verbal orders so they can order compression socks for the patient for her edema on her lower legs. She is also wanting to verify if the patient needs to be taking gabapentin (NEURONTIN) 100 MG capsulebecause the patient is taking it but they do not have it on their medication list. She also wanted to know if the patient needs to be taking furosemide (LASIX) 80 MG tablet 2 or 3 times a day.  Moshe Salisbury- [phone] Okay to leave VM

## 2020-04-06 NOTE — Telephone Encounter (Signed)
Ok for compression socks   I advised pt to take lasix twice daily.    She has neuropathy - that is why she is taking gabapentin.  As far as I know it is helping so she should continue it.  If she feels it not helping she can stop it.

## 2020-04-08 ENCOUNTER — Telehealth: Payer: Self-pay | Admitting: Internal Medicine

## 2020-04-08 NOTE — Telephone Encounter (Signed)
Patient requesting refill for spironolactone (ALDACTONE) 25 MG tablet  Pharmacy [facility] [place], [address] [place] AT [address]

## 2020-04-08 NOTE — Telephone Encounter (Signed)
Spoke to April [name] today and info given.

## 2020-04-09 ENCOUNTER — Other Ambulatory Visit: Payer: Self-pay

## 2020-04-09 DIAGNOSIS — E1122 Type 2 diabetes mellitus with diabetic chronic kidney disease: Secondary | ICD-10-CM | POA: Diagnosis not present

## 2020-04-09 DIAGNOSIS — I130 Hypertensive heart and chronic kidney disease with heart failure and stage 1 through stage 4 chronic kidney disease, or unspecified chronic kidney disease: Secondary | ICD-10-CM | POA: Diagnosis not present

## 2020-04-09 DIAGNOSIS — I509 Heart failure, unspecified: Secondary | ICD-10-CM | POA: Diagnosis not present

## 2020-04-09 DIAGNOSIS — N183 Chronic kidney disease, stage 3 unspecified: Secondary | ICD-10-CM | POA: Diagnosis not present

## 2020-04-09 DIAGNOSIS — I2510 Atherosclerotic heart disease of native coronary artery without angina pectoris: Secondary | ICD-10-CM | POA: Diagnosis not present

## 2020-04-09 DIAGNOSIS — J449 Chronic obstructive pulmonary disease, unspecified: Secondary | ICD-10-CM | POA: Diagnosis not present

## 2020-04-09 DIAGNOSIS — E1142 Type 2 diabetes mellitus with diabetic polyneuropathy: Secondary | ICD-10-CM | POA: Diagnosis not present

## 2020-04-09 DIAGNOSIS — M1A0710 Idiopathic chronic gout, right ankle and foot, without tophus (tophi): Secondary | ICD-10-CM | POA: Diagnosis not present

## 2020-04-09 MED ORDER — SPIRONOLACTONE 25 MG PO TABS: 25.0000 mg | ORAL_TABLET | Freq: Every day | ORAL | 0 refills | Status: DC

## 2020-04-09 NOTE — Telephone Encounter (Signed)
Sent in today 

## 2020-04-11 DIAGNOSIS — J449 Chronic obstructive pulmonary disease, unspecified: Secondary | ICD-10-CM | POA: Diagnosis not present

## 2020-04-11 DIAGNOSIS — E1142 Type 2 diabetes mellitus with diabetic polyneuropathy: Secondary | ICD-10-CM | POA: Diagnosis not present

## 2020-04-11 DIAGNOSIS — E1122 Type 2 diabetes mellitus with diabetic chronic kidney disease: Secondary | ICD-10-CM | POA: Diagnosis not present

## 2020-04-11 DIAGNOSIS — M6281 Muscle weakness (generalized): Secondary | ICD-10-CM | POA: Diagnosis not present

## 2020-04-11 DIAGNOSIS — M1A0710 Idiopathic chronic gout, right ankle and foot, without tophus (tophi): Secondary | ICD-10-CM | POA: Diagnosis not present

## 2020-04-11 DIAGNOSIS — I2510 Atherosclerotic heart disease of native coronary artery without angina pectoris: Secondary | ICD-10-CM | POA: Diagnosis not present

## 2020-04-11 DIAGNOSIS — E661 Drug-induced obesity: Secondary | ICD-10-CM | POA: Diagnosis not present

## 2020-04-11 DIAGNOSIS — I509 Heart failure, unspecified: Secondary | ICD-10-CM | POA: Diagnosis not present

## 2020-04-11 DIAGNOSIS — I130 Hypertensive heart and chronic kidney disease with heart failure and stage 1 through stage 4 chronic kidney disease, or unspecified chronic kidney disease: Secondary | ICD-10-CM | POA: Diagnosis not present

## 2020-04-11 DIAGNOSIS — R609 Edema, unspecified: Secondary | ICD-10-CM | POA: Diagnosis not present

## 2020-04-11 DIAGNOSIS — N183 Chronic kidney disease, stage 3 unspecified: Secondary | ICD-10-CM | POA: Diagnosis not present

## 2020-04-15 DIAGNOSIS — N183 Chronic kidney disease, stage 3 unspecified: Secondary | ICD-10-CM | POA: Diagnosis not present

## 2020-04-15 DIAGNOSIS — I130 Hypertensive heart and chronic kidney disease with heart failure and stage 1 through stage 4 chronic kidney disease, or unspecified chronic kidney disease: Secondary | ICD-10-CM | POA: Diagnosis not present

## 2020-04-15 DIAGNOSIS — M1A0710 Idiopathic chronic gout, right ankle and foot, without tophus (tophi): Secondary | ICD-10-CM | POA: Diagnosis not present

## 2020-04-15 DIAGNOSIS — J449 Chronic obstructive pulmonary disease, unspecified: Secondary | ICD-10-CM | POA: Diagnosis not present

## 2020-04-15 DIAGNOSIS — E1142 Type 2 diabetes mellitus with diabetic polyneuropathy: Secondary | ICD-10-CM | POA: Diagnosis not present

## 2020-04-15 DIAGNOSIS — E1122 Type 2 diabetes mellitus with diabetic chronic kidney disease: Secondary | ICD-10-CM | POA: Diagnosis not present

## 2020-04-15 DIAGNOSIS — I509 Heart failure, unspecified: Secondary | ICD-10-CM | POA: Diagnosis not present

## 2020-04-15 DIAGNOSIS — I2510 Atherosclerotic heart disease of native coronary artery without angina pectoris: Secondary | ICD-10-CM | POA: Diagnosis not present

## 2020-04-17 DIAGNOSIS — E1142 Type 2 diabetes mellitus with diabetic polyneuropathy: Secondary | ICD-10-CM | POA: Diagnosis not present

## 2020-04-17 DIAGNOSIS — J449 Chronic obstructive pulmonary disease, unspecified: Secondary | ICD-10-CM | POA: Diagnosis not present

## 2020-04-17 DIAGNOSIS — E1122 Type 2 diabetes mellitus with diabetic chronic kidney disease: Secondary | ICD-10-CM | POA: Diagnosis not present

## 2020-04-17 DIAGNOSIS — M1A0710 Idiopathic chronic gout, right ankle and foot, without tophus (tophi): Secondary | ICD-10-CM | POA: Diagnosis not present

## 2020-04-17 DIAGNOSIS — I2510 Atherosclerotic heart disease of native coronary artery without angina pectoris: Secondary | ICD-10-CM | POA: Diagnosis not present

## 2020-04-17 DIAGNOSIS — N183 Chronic kidney disease, stage 3 unspecified: Secondary | ICD-10-CM | POA: Diagnosis not present

## 2020-04-17 DIAGNOSIS — I509 Heart failure, unspecified: Secondary | ICD-10-CM | POA: Diagnosis not present

## 2020-04-17 DIAGNOSIS — I130 Hypertensive heart and chronic kidney disease with heart failure and stage 1 through stage 4 chronic kidney disease, or unspecified chronic kidney disease: Secondary | ICD-10-CM | POA: Diagnosis not present

## 2020-04-19 ENCOUNTER — Ambulatory Visit: Payer: Medicare PPO

## 2020-04-23 ENCOUNTER — Telehealth: Payer: Self-pay | Admitting: Internal Medicine

## 2020-04-23 DIAGNOSIS — I509 Heart failure, unspecified: Secondary | ICD-10-CM | POA: Diagnosis not present

## 2020-04-23 DIAGNOSIS — E1142 Type 2 diabetes mellitus with diabetic polyneuropathy: Secondary | ICD-10-CM | POA: Diagnosis not present

## 2020-04-23 DIAGNOSIS — E1122 Type 2 diabetes mellitus with diabetic chronic kidney disease: Secondary | ICD-10-CM | POA: Diagnosis not present

## 2020-04-23 DIAGNOSIS — I130 Hypertensive heart and chronic kidney disease with heart failure and stage 1 through stage 4 chronic kidney disease, or unspecified chronic kidney disease: Secondary | ICD-10-CM | POA: Diagnosis not present

## 2020-04-23 DIAGNOSIS — N183 Chronic kidney disease, stage 3 unspecified: Secondary | ICD-10-CM | POA: Diagnosis not present

## 2020-04-23 DIAGNOSIS — I2510 Atherosclerotic heart disease of native coronary artery without angina pectoris: Secondary | ICD-10-CM | POA: Diagnosis not present

## 2020-04-23 DIAGNOSIS — J449 Chronic obstructive pulmonary disease, unspecified: Secondary | ICD-10-CM | POA: Diagnosis not present

## 2020-04-23 DIAGNOSIS — M1A0710 Idiopathic chronic gout, right ankle and foot, without tophus (tophi): Secondary | ICD-10-CM | POA: Diagnosis not present

## 2020-04-23 NOTE — Telephone Encounter (Signed)
I think she needs to see pain management - does she agree with referral?

## 2020-04-23 NOTE — Telephone Encounter (Signed)
Patient called and said that oxyCODONE (OXY IR/ROXICODONE) 5 MG immediate release tablet is not helping for her pain. She is requesting a call back and can be reached at [phone]. Please advise

## 2020-04-25 DIAGNOSIS — M1A0710 Idiopathic chronic gout, right ankle and foot, without tophus (tophi): Secondary | ICD-10-CM | POA: Diagnosis not present

## 2020-04-25 DIAGNOSIS — E1122 Type 2 diabetes mellitus with diabetic chronic kidney disease: Secondary | ICD-10-CM | POA: Diagnosis not present

## 2020-04-25 DIAGNOSIS — J449 Chronic obstructive pulmonary disease, unspecified: Secondary | ICD-10-CM | POA: Diagnosis not present

## 2020-04-25 DIAGNOSIS — E1142 Type 2 diabetes mellitus with diabetic polyneuropathy: Secondary | ICD-10-CM | POA: Diagnosis not present

## 2020-04-25 DIAGNOSIS — I509 Heart failure, unspecified: Secondary | ICD-10-CM | POA: Diagnosis not present

## 2020-04-25 DIAGNOSIS — I2510 Atherosclerotic heart disease of native coronary artery without angina pectoris: Secondary | ICD-10-CM | POA: Diagnosis not present

## 2020-04-25 DIAGNOSIS — I130 Hypertensive heart and chronic kidney disease with heart failure and stage 1 through stage 4 chronic kidney disease, or unspecified chronic kidney disease: Secondary | ICD-10-CM | POA: Diagnosis not present

## 2020-04-25 DIAGNOSIS — N183 Chronic kidney disease, stage 3 unspecified: Secondary | ICD-10-CM | POA: Diagnosis not present

## 2020-04-26 ENCOUNTER — Telehealth: Payer: Self-pay | Admitting: Internal Medicine

## 2020-04-26 MED ORDER — OXYCODONE HCL 5 MG PO TABS: 5.0000 mg | ORAL_TABLET | Freq: Four times a day (QID) | ORAL | 0 refills | Status: DC | PRN

## 2020-04-26 NOTE — Telephone Encounter (Signed)
   Patient requesting refill for oxyCODONE (OXY IR/ROXICODONE) 5 MG immediate release tablet  Pharmacy [facility] [place], [address] [place]

## 2020-05-01 DIAGNOSIS — I509 Heart failure, unspecified: Secondary | ICD-10-CM | POA: Diagnosis not present

## 2020-05-01 DIAGNOSIS — J449 Chronic obstructive pulmonary disease, unspecified: Secondary | ICD-10-CM | POA: Diagnosis not present

## 2020-05-01 DIAGNOSIS — E1122 Type 2 diabetes mellitus with diabetic chronic kidney disease: Secondary | ICD-10-CM | POA: Diagnosis not present

## 2020-05-01 DIAGNOSIS — M1A0710 Idiopathic chronic gout, right ankle and foot, without tophus (tophi): Secondary | ICD-10-CM | POA: Diagnosis not present

## 2020-05-01 DIAGNOSIS — I130 Hypertensive heart and chronic kidney disease with heart failure and stage 1 through stage 4 chronic kidney disease, or unspecified chronic kidney disease: Secondary | ICD-10-CM | POA: Diagnosis not present

## 2020-05-01 DIAGNOSIS — E1142 Type 2 diabetes mellitus with diabetic polyneuropathy: Secondary | ICD-10-CM | POA: Diagnosis not present

## 2020-05-01 DIAGNOSIS — I2510 Atherosclerotic heart disease of native coronary artery without angina pectoris: Secondary | ICD-10-CM | POA: Diagnosis not present

## 2020-05-01 DIAGNOSIS — N183 Chronic kidney disease, stage 3 unspecified: Secondary | ICD-10-CM | POA: Diagnosis not present

## 2020-05-02 DIAGNOSIS — I509 Heart failure, unspecified: Secondary | ICD-10-CM | POA: Diagnosis not present

## 2020-05-02 DIAGNOSIS — N183 Chronic kidney disease, stage 3 unspecified: Secondary | ICD-10-CM | POA: Diagnosis not present

## 2020-05-02 DIAGNOSIS — M1A0710 Idiopathic chronic gout, right ankle and foot, without tophus (tophi): Secondary | ICD-10-CM | POA: Diagnosis not present

## 2020-05-02 DIAGNOSIS — E1142 Type 2 diabetes mellitus with diabetic polyneuropathy: Secondary | ICD-10-CM | POA: Diagnosis not present

## 2020-05-02 DIAGNOSIS — J449 Chronic obstructive pulmonary disease, unspecified: Secondary | ICD-10-CM | POA: Diagnosis not present

## 2020-05-02 DIAGNOSIS — I2510 Atherosclerotic heart disease of native coronary artery without angina pectoris: Secondary | ICD-10-CM | POA: Diagnosis not present

## 2020-05-02 DIAGNOSIS — E1122 Type 2 diabetes mellitus with diabetic chronic kidney disease: Secondary | ICD-10-CM | POA: Diagnosis not present

## 2020-05-02 DIAGNOSIS — I130 Hypertensive heart and chronic kidney disease with heart failure and stage 1 through stage 4 chronic kidney disease, or unspecified chronic kidney disease: Secondary | ICD-10-CM | POA: Diagnosis not present

## 2020-05-03 ENCOUNTER — Telehealth: Payer: Self-pay | Admitting: Internal Medicine

## 2020-05-03 NOTE — Telephone Encounter (Signed)
potassium chloride SA (KLOR-CON) 20 MEQ tablet allopurinol (ZYLOPRIM) 100 MG tablet colchicine 0.6 MG tablet carvedilol (COREG) 12.5 MG tablet simvastatin (ZOCOR) 40 MG tablet [facility] [phone] - [address], [address] AT [address] Phone:  [phone]  Fax:  [phone]    Last seen- 02.04.22 Next apt- n/a

## 2020-05-03 NOTE — Telephone Encounter (Signed)
Patient requesting a call from Pharmacist to discuss medications versus Medicare

## 2020-05-06 ENCOUNTER — Other Ambulatory Visit: Payer: Self-pay

## 2020-05-06 ENCOUNTER — Telehealth: Payer: Self-pay | Admitting: Sports Medicine

## 2020-05-06 DIAGNOSIS — E785 Hyperlipidemia, unspecified: Secondary | ICD-10-CM

## 2020-05-06 MED ORDER — SIMVASTATIN 40 MG PO TABS: 40.0000 mg | ORAL_TABLET | Freq: Every day | ORAL | 1 refills | Status: DC

## 2020-05-06 MED ORDER — COLCHICINE 0.6 MG PO TABS: 0.3000 mg | ORAL_TABLET | Freq: Every day | ORAL | 1 refills | Status: DC

## 2020-05-06 MED ORDER — POTASSIUM CHLORIDE CRYS ER 20 MEQ PO TBCR: 20.0000 meq | EXTENDED_RELEASE_TABLET | Freq: Three times a day (TID) | ORAL | 1 refills | Status: DC

## 2020-05-06 MED ORDER — CARVEDILOL 12.5 MG PO TABS: ORAL_TABLET | ORAL | 0 refills | Status: DC

## 2020-05-06 MED ORDER — ALLOPURINOL 100 MG PO TABS: 100.0000 mg | ORAL_TABLET | Freq: Every day | ORAL | 1 refills | Status: DC

## 2020-05-06 NOTE — Telephone Encounter (Signed)
Pt called and left message checking on hers and her husbands diabetic shoes.  I returned call and left message that we have not received the paperwork from pcp.

## 2020-05-06 NOTE — Telephone Encounter (Signed)
Pt called back and I explained I had left message that we have not received the documents for the diabetic shoes.   Pt called the pcp and they stated they have not received it and they gave her 2 fax numbers. 1 I had on file and the other was [phone] attention Clara.. I have faxed both pts paperwork to the 2nd number and got confirmation.

## 2020-05-06 NOTE — Telephone Encounter (Signed)
Sent in today 

## 2020-05-07 ENCOUNTER — Telehealth: Payer: Self-pay | Admitting: Internal Medicine

## 2020-05-07 ENCOUNTER — Telehealth (HOSPITAL_COMMUNITY): Payer: Self-pay | Admitting: *Deleted

## 2020-05-07 ENCOUNTER — Other Ambulatory Visit: Payer: Self-pay

## 2020-05-07 ENCOUNTER — Emergency Department (HOSPITAL_COMMUNITY): Payer: Medicare PPO

## 2020-05-07 ENCOUNTER — Inpatient Hospital Stay (HOSPITAL_COMMUNITY)
Admission: EM | Admit: 2020-05-07 | Discharge: 2020-05-15 | DRG: 189 | Disposition: A | Discharge status: Skilled Nursing Facility | Payer: Medicare PPO | Attending: Internal Medicine | Admitting: Internal Medicine

## 2020-05-07 ENCOUNTER — Encounter (HOSPITAL_COMMUNITY): Payer: Self-pay | Admitting: Pharmacy Technician

## 2020-05-07 ENCOUNTER — Telehealth (HOSPITAL_COMMUNITY): Payer: Self-pay | Admitting: Vascular Surgery

## 2020-05-07 DIAGNOSIS — I272 Pulmonary hypertension, unspecified: Secondary | ICD-10-CM | POA: Diagnosis present

## 2020-05-07 DIAGNOSIS — I2510 Atherosclerotic heart disease of native coronary artery without angina pectoris: Secondary | ICD-10-CM | POA: Diagnosis present

## 2020-05-07 DIAGNOSIS — J9601 Acute respiratory failure with hypoxia: Principal | ICD-10-CM | POA: Diagnosis present

## 2020-05-07 DIAGNOSIS — E538 Deficiency of other specified B group vitamins: Secondary | ICD-10-CM | POA: Diagnosis present

## 2020-05-07 DIAGNOSIS — R531 Weakness: Secondary | ICD-10-CM | POA: Diagnosis not present

## 2020-05-07 DIAGNOSIS — Z6841 Body Mass Index (BMI) 40.0 and over, adult: Secondary | ICD-10-CM

## 2020-05-07 DIAGNOSIS — S40021A Contusion of right upper arm, initial encounter: Secondary | ICD-10-CM | POA: Diagnosis present

## 2020-05-07 DIAGNOSIS — I872 Venous insufficiency (chronic) (peripheral): Secondary | ICD-10-CM | POA: Diagnosis not present

## 2020-05-07 DIAGNOSIS — Z20822 Contact with and (suspected) exposure to covid-19: Secondary | ICD-10-CM | POA: Diagnosis present

## 2020-05-07 DIAGNOSIS — I87301 Chronic venous hypertension (idiopathic) without complications of right lower extremity: Secondary | ICD-10-CM | POA: Diagnosis not present

## 2020-05-07 DIAGNOSIS — G4733 Obstructive sleep apnea (adult) (pediatric): Secondary | ICD-10-CM | POA: Diagnosis present

## 2020-05-07 DIAGNOSIS — L03113 Cellulitis of right upper limb: Secondary | ICD-10-CM

## 2020-05-07 DIAGNOSIS — K449 Diaphragmatic hernia without obstruction or gangrene: Secondary | ICD-10-CM | POA: Diagnosis not present

## 2020-05-07 DIAGNOSIS — Z87442 Personal history of urinary calculi: Secondary | ICD-10-CM

## 2020-05-07 DIAGNOSIS — J9811 Atelectasis: Secondary | ICD-10-CM | POA: Diagnosis not present

## 2020-05-07 DIAGNOSIS — E11628 Type 2 diabetes mellitus with other skin complications: Secondary | ICD-10-CM | POA: Diagnosis present

## 2020-05-07 DIAGNOSIS — M1710 Unilateral primary osteoarthritis, unspecified knee: Secondary | ICD-10-CM | POA: Diagnosis present

## 2020-05-07 DIAGNOSIS — J449 Chronic obstructive pulmonary disease, unspecified: Secondary | ICD-10-CM | POA: Diagnosis not present

## 2020-05-07 DIAGNOSIS — Z90710 Acquired absence of both cervix and uterus: Secondary | ICD-10-CM

## 2020-05-07 DIAGNOSIS — R0902 Hypoxemia: Secondary | ICD-10-CM

## 2020-05-07 DIAGNOSIS — Z91013 Allergy to seafood: Secondary | ICD-10-CM

## 2020-05-07 DIAGNOSIS — I130 Hypertensive heart and chronic kidney disease with heart failure and stage 1 through stage 4 chronic kidney disease, or unspecified chronic kidney disease: Secondary | ICD-10-CM | POA: Diagnosis present

## 2020-05-07 DIAGNOSIS — M2550 Pain in unspecified joint: Secondary | ICD-10-CM | POA: Diagnosis not present

## 2020-05-07 DIAGNOSIS — L03119 Cellulitis of unspecified part of limb: Secondary | ICD-10-CM | POA: Diagnosis not present

## 2020-05-07 DIAGNOSIS — Z86010 Personal history of colonic polyps: Secondary | ICD-10-CM

## 2020-05-07 DIAGNOSIS — Z880 Allergy status to penicillin: Secondary | ICD-10-CM

## 2020-05-07 DIAGNOSIS — Z79899 Other long term (current) drug therapy: Secondary | ICD-10-CM

## 2020-05-07 DIAGNOSIS — G609 Hereditary and idiopathic neuropathy, unspecified: Secondary | ICD-10-CM | POA: Diagnosis present

## 2020-05-07 DIAGNOSIS — Z8616 Personal history of COVID-19: Secondary | ICD-10-CM | POA: Diagnosis not present

## 2020-05-07 DIAGNOSIS — L03115 Cellulitis of right lower limb: Secondary | ICD-10-CM | POA: Diagnosis not present

## 2020-05-07 DIAGNOSIS — J45901 Unspecified asthma with (acute) exacerbation: Secondary | ICD-10-CM | POA: Diagnosis present

## 2020-05-07 DIAGNOSIS — Z888 Allergy status to other drugs, medicaments and biological substances status: Secondary | ICD-10-CM

## 2020-05-07 DIAGNOSIS — I5033 Acute on chronic diastolic (congestive) heart failure: Secondary | ICD-10-CM | POA: Diagnosis present

## 2020-05-07 DIAGNOSIS — Z8711 Personal history of peptic ulcer disease: Secondary | ICD-10-CM | POA: Diagnosis not present

## 2020-05-07 DIAGNOSIS — E662 Morbid (severe) obesity with alveolar hypoventilation: Secondary | ICD-10-CM | POA: Diagnosis present

## 2020-05-07 DIAGNOSIS — L0390 Cellulitis, unspecified: Secondary | ICD-10-CM

## 2020-05-07 DIAGNOSIS — S81801D Unspecified open wound, right lower leg, subsequent encounter: Secondary | ICD-10-CM | POA: Diagnosis not present

## 2020-05-07 DIAGNOSIS — Z833 Family history of diabetes mellitus: Secondary | ICD-10-CM

## 2020-05-07 DIAGNOSIS — M7989 Other specified soft tissue disorders: Secondary | ICD-10-CM | POA: Diagnosis not present

## 2020-05-07 DIAGNOSIS — I890 Lymphedema, not elsewhere classified: Secondary | ICD-10-CM | POA: Diagnosis present

## 2020-05-07 DIAGNOSIS — Z743 Need for continuous supervision: Secondary | ICD-10-CM | POA: Diagnosis not present

## 2020-05-07 DIAGNOSIS — I517 Cardiomegaly: Secondary | ICD-10-CM | POA: Diagnosis not present

## 2020-05-07 DIAGNOSIS — R0602 Shortness of breath: Secondary | ICD-10-CM | POA: Diagnosis present

## 2020-05-07 DIAGNOSIS — I82403 Acute embolism and thrombosis of unspecified deep veins of lower extremity, bilateral: Secondary | ICD-10-CM | POA: Diagnosis present

## 2020-05-07 DIAGNOSIS — Z800 Family history of malignant neoplasm of digestive organs: Secondary | ICD-10-CM

## 2020-05-07 DIAGNOSIS — K219 Gastro-esophageal reflux disease without esophagitis: Secondary | ICD-10-CM | POA: Diagnosis present

## 2020-05-07 DIAGNOSIS — I509 Heart failure, unspecified: Secondary | ICD-10-CM | POA: Diagnosis not present

## 2020-05-07 DIAGNOSIS — Z7951 Long term (current) use of inhaled steroids: Secondary | ICD-10-CM

## 2020-05-07 DIAGNOSIS — E782 Mixed hyperlipidemia: Secondary | ICD-10-CM | POA: Diagnosis not present

## 2020-05-07 DIAGNOSIS — L97919 Non-pressure chronic ulcer of unspecified part of right lower leg with unspecified severity: Secondary | ICD-10-CM | POA: Diagnosis not present

## 2020-05-07 DIAGNOSIS — Z7982 Long term (current) use of aspirin: Secondary | ICD-10-CM

## 2020-05-07 DIAGNOSIS — Z7401 Bed confinement status: Secondary | ICD-10-CM | POA: Diagnosis not present

## 2020-05-07 DIAGNOSIS — E1122 Type 2 diabetes mellitus with diabetic chronic kidney disease: Secondary | ICD-10-CM | POA: Diagnosis present

## 2020-05-07 DIAGNOSIS — E785 Hyperlipidemia, unspecified: Secondary | ICD-10-CM | POA: Diagnosis present

## 2020-05-07 DIAGNOSIS — J9602 Acute respiratory failure with hypercapnia: Secondary | ICD-10-CM | POA: Diagnosis not present

## 2020-05-07 DIAGNOSIS — E1140 Type 2 diabetes mellitus with diabetic neuropathy, unspecified: Secondary | ICD-10-CM | POA: Diagnosis not present

## 2020-05-07 DIAGNOSIS — N183 Chronic kidney disease, stage 3 unspecified: Secondary | ICD-10-CM | POA: Diagnosis not present

## 2020-05-07 DIAGNOSIS — E1142 Type 2 diabetes mellitus with diabetic polyneuropathy: Secondary | ICD-10-CM | POA: Diagnosis not present

## 2020-05-07 DIAGNOSIS — M79606 Pain in leg, unspecified: Secondary | ICD-10-CM | POA: Diagnosis not present

## 2020-05-07 DIAGNOSIS — Z91040 Latex allergy status: Secondary | ICD-10-CM

## 2020-05-07 DIAGNOSIS — N1832 Chronic kidney disease, stage 3b: Secondary | ICD-10-CM | POA: Diagnosis present

## 2020-05-07 DIAGNOSIS — M1A0710 Idiopathic chronic gout, right ankle and foot, without tophus (tophi): Secondary | ICD-10-CM | POA: Diagnosis not present

## 2020-05-07 DIAGNOSIS — X58XXXA Exposure to other specified factors, initial encounter: Secondary | ICD-10-CM | POA: Diagnosis present

## 2020-05-07 LAB — BASIC METABOLIC PANEL
Anion gap: 8 (ref 5–15)
BUN: 29 mg/dL — ABNORMAL HIGH (ref 8–23)
CO2: 34 mmol/L — ABNORMAL HIGH (ref 22–32)
Calcium: 9.3 mg/dL (ref 8.9–10.3)
Chloride: 96 mmol/L — ABNORMAL LOW (ref 98–111)
Creatinine, Ser: 1.48 mg/dL — ABNORMAL HIGH (ref 0.44–1.00)
GFR, Estimated: 38 mL/min — ABNORMAL LOW (ref >60)
Glucose, Bld: 108 mg/dL — ABNORMAL HIGH (ref 70–99)
Potassium: 4.3 mmol/L (ref 3.5–5.1)
Sodium: 138 mmol/L (ref 135–145)

## 2020-05-07 LAB — CBG MONITORING, ED: Glucose-Capillary: 102 mg/dL — ABNORMAL HIGH (ref 70–99)

## 2020-05-07 LAB — CBC WITH DIFFERENTIAL/PLATELET
Abs Immature Granulocytes: 0.02 10*3/uL (ref 0.00–0.07)
Basophils Absolute: 0.1 10*3/uL (ref 0.0–0.1)
Basophils Relative: 1 %
Eosinophils Absolute: 0.1 10*3/uL (ref 0.0–0.5)
Eosinophils Relative: 2 %
HCT: 41.3 % (ref 36.0–46.0)
Hemoglobin: 12.6 g/dL (ref 12.0–15.0)
Immature Granulocytes: 0 %
Lymphocytes Relative: 21 %
Lymphs Abs: 1 10*3/uL (ref 0.7–4.0)
MCH: 30.1 pg (ref 26.0–34.0)
MCHC: 30.5 g/dL (ref 30.0–36.0)
MCV: 98.8 fL (ref 80.0–100.0)
Monocytes Absolute: 0.4 10*3/uL (ref 0.1–1.0)
Monocytes Relative: 8 %
Neutro Abs: 3.3 10*3/uL (ref 1.7–7.7)
Neutrophils Relative %: 68 %
Platelets: 155 10*3/uL (ref 150–400)
RBC: 4.18 MIL/uL (ref 3.87–5.11)
RDW: 14.8 % (ref 11.5–15.5)
WBC: 4.9 10*3/uL (ref 4.0–10.5)
nRBC: 0 % (ref 0.0–0.2)

## 2020-05-07 LAB — TROPONIN I (HIGH SENSITIVITY)
Troponin I (High Sensitivity): 12 ng/L (ref <18)
Troponin I (High Sensitivity): 22 ng/L — ABNORMAL HIGH (ref <18)

## 2020-05-07 LAB — BRAIN NATRIURETIC PEPTIDE: B Natriuretic Peptide: 60.9 pg/mL (ref 0.0–100.0)

## 2020-05-07 MED ORDER — IOHEXOL 350 MG/ML SOLN
100.0000 mL | Freq: Once | INTRAVENOUS | Status: AC | PRN
  Administered 2020-05-07: 100 mL via INTRAVENOUS

## 2020-05-07 MED ORDER — ACETAMINOPHEN 325 MG PO TABS
650.0000 mg | ORAL_TABLET | Freq: Once | ORAL | Status: AC
  Administered 2020-05-07: 650 mg via ORAL
  Filled 2020-05-07: qty 2

## 2020-05-07 NOTE — ED Provider Notes (Signed)
[facility] Provider Note   CSN: [id number] Arrival date & time: 05/07/20  1705     History Chief Complaint  Patient presents with  . Shortness of Breath    April [name] is a 70 y.o. female.  HPI     Since last week has had shortness of breath, progessively worse and worse over last few days Swelling legs, history of CHF On 40 lasix TID but it doesn't seem to be helping PT came today and she could not participate because of dyspnea Reports dyspnea, doesn't even feel like getting dressed, feels short of breath even with minimal exertion, walking to bathroom Laying down flat makes breathing worse Wheezing more, hx of asthma, inhalers help sometimes Chest hurting right now, sharp pains, constant, just started now Cough for 1 week, coughed up a little but not much, yellow No known fevers Right leg with swelling and pain worse than left, made an appt with Dr. [name] to help O2 level with PT was 82 No known sick contacts Some wheezing but doesn't feel like asthma  Past Medical History:  Diagnosis Date  . Anemia   . Asthma   . CAD (coronary artery disease)   . Carpal tunnel syndrome   . Cellulitis of both lower extremities 04/11/2015  . CHF (congestive heart failure) ([place])   . Chronic kidney disease    [facility]- followed by [facility]  . Colon polyp, hyperplastic 2007 & 2012  . Complication of anesthesia 1999   svt with renal calculi surgery, no problems since  . CTS (carpal tunnel syndrome)    bilateral  . Diabetes mellitus   . Eczema   . GERD (gastroesophageal reflux disease)   . History of kidney stones 1999  . Hyperlipidemia   . Hypertension   . Leg ulcer ([place]) 04/24/2015   Right lateral leg No evidence of an infection Monitor closely Keep edema controlled   . Leg ulcer ([place])    right lower  . Meralgia paresthetica    Dr. [name]  . Morbid obesity ([place])   . Morbid obesity ([place])   . Neuropathy    toes and legs  .  Osteoarthrosis, unspecified whether generalized or localized, lower leg    knee  . PUD (peptic ulcer disease)   . Shortness of breath dyspnea    with exertion  . Sleep apnea    per progress note 02/25/2018  . Type II or unspecified type diabetes mellitus without mention of complication, not stated as uncontrolled   . Unspecified hereditary and idiopathic peripheral neuropathy   . Urticaria   . Vitamin B12 deficiency   . Wears glasses   . Wound cellulitis    right upper leg, healing well    Patient Active Problem List   Diagnosis Date Noted  . Cellulitis, leg 05/08/2020  . SOB (shortness of breath)   . Acute idiopathic gout of right foot   . Morbid obesity ([place])   . Idiopathic chronic gout of multiple sites without tophus   . Cellulitis 02/02/2019  . Pneumonia due to COVID-19 virus 12/10/2018  . COVID-19 virus infection 12/06/2018  . Right hip pain 11/09/2018  . Osteomyelitis of ankle or foot, acute, left ([place]) 09/07/2018  . Osteomyelitis of ankle or foot, left, acute ([place]) 09/07/2018  . Onychomycosis 05/19/2018  . Idiopathic chronic venous hypertension of right lower extremity with ulcer and inflammation ([place]) 05/19/2018  . Skin lumps 01/14/2018  . Bilateral leg cramps 12/14/2017  . Left shoulder  pain 08/14/2016  . Meralgia paresthetica 07/16/2016  . Chronic left-sided low back pain with left-sided sciatica 06/02/2016  . Whole body pain 03/20/2016  . Osteopenia 01/11/2016  . CKD (chronic kidney disease) stage 3, GFR 30-59 ml/min ([facility]) 01/02/2016  . Hand paresthesia 07/18/2015  . Carpal tunnel syndrome 06/07/2015  . Family history of colon cancer   . Diabetes mellitus with neurological manifestations ([place]) 04/18/2015  . Bilateral leg edema 04/11/2015  . Abnormality of gait 01/03/2015  . Hereditary and idiopathic peripheral neuropathy 01/03/2015  . GERD (gastroesophageal reflux disease) 06/17/2014  . Hx of colonic polyps 12/15/2012  . Vitamin B12 deficiency 11/03/2012  .  Intrinsic asthma 03/23/2012  . Chronic diastolic heart failure ([place]) 02/20/2011  . OSA (obstructive sleep apnea) 09/17/2010  . URINARY URGENCY 01/08/2010  . CAD, NATIVE VESSEL 11/20/2008  . Osteoarthritis of knees, bilateral 06/14/2008  . Hyperlipidemia 05/10/2007  . Essential hypertension 01/18/2007  . Hypokalemia 04/30/2006  . HX, PERSONAL, PEPTIC ULCER DISEASE 04/30/2006    Past Surgical History:  Procedure Laterality Date  . ABDOMINAL HYSTERECTOMY    . BREAST BIOPSY Right 07/08/2018  . CARDIAC CATHETERIZATION  2002   non obstructive disease  . colonoscopy with polypectomy  2007 & 2012    hyperplastic ;Dr Watt Climes  . COLONOSCOPY WITH PROPOFOL N/A 06/04/2015   Procedure: COLONOSCOPY WITH PROPOFOL;  Surgeon: Jerene Bears, MD;  Location: [facility];  Service: Gastroenterology;  Laterality: N/A;  . DEBRIDEMENT LEG Right 03/02/2018   WOUND VAC APPLIED  . DEBRIDEMENT LEG Right 05/27/2018   RIGHT LOWER LEG DEBRIDEMENT,  SKIN GRAFT, VAC PLACEMENT   . DILATION AND CURETTAGE OF UTERUS     multiple  . HEMORRHOID SURGERY    . I & D EXTREMITY Right 03/02/2018   Procedure: RIGHT LEG DEBRIDEMENT AND PLACE VAC;  Surgeon: [name], MD;  Location: [facility];  Service: Orthopedics;  Laterality: Right;  . I & D EXTREMITY Right 03/04/2018   Procedure: REPEAT IRRIGATION AND DEBRIDEMENT RIGHT LEG, PLACE WOUND VAC;  Surgeon: [name], MD;  Location: [facility];  Service: Orthopedics;  Laterality: Right;  . I & D EXTREMITY Right 03/30/2018   Procedure: IRRIGATION AND DEBRIDEMENT RIGHT LEG, APPLY WOUND VAC;  Surgeon: [name], MD;  Location: [facility];  Service: Orthopedics;  Laterality: Right;  . I & D EXTREMITY Right 05/27/2018   Procedure: RIGHT LOWER LEG DEBRIDEMENT,  SKIN GRAFT, VAC PLACEMENT;  Surgeon: [name], MD;  Location: [facility];  Service: Orthopedics;  Laterality: Right;  RIGHT LOWER LEG DEBRIDEMENT,  SKIN GRAFT, VAC PLACEMENT  . renal calculi  12/1997   SVT with induction of anesthesia   . right knee arthroscopy    . SKIN SPLIT GRAFT Right 03/04/2018   Procedure: POSSIBLE SPLIT THICKNESS SKIN GRAFT;  Surgeon: [name], MD;  Location: [facility];  Service: Orthopedics;  Laterality: Right;  . SKIN SPLIT GRAFT Right 04/01/2018   Procedure: REPEAT IRRIGATION AND DEBRIDEMENT RIGHT LEG, APPLY SPLIT THICKNESS SKIN GRAFT;  Surgeon: [name], MD;  Location: [facility];  Service: Orthopedics;  Laterality: Right;  . TONSILLECTOMY AND ADENOIDECTOMY       OB History   No obstetric history on file.     Family History  Problem Relation Age of Onset  . Colon cancer Mother   . Prostate cancer Father   . Colon cancer Father   . Diabetes Maternal Aunt   . Breast cancer Maternal Aunt   . Diabetes Maternal Uncle   .  Diabetes Paternal Aunt   . Stroke Paternal Aunt        > 65  . Heart disease Paternal Aunt   . Diabetes Paternal Uncle   . Breast cancer Maternal Aunt         X 2  . Breast cancer Cousin     Social History   Tobacco Use  . Smoking status: Never Smoker  . Smokeless tobacco: Never Used  Vaping Use  . Vaping Use: Never used  Substance Use Topics  . Alcohol use: No    Alcohol/week: 0.0 standard drinks  . Drug use: No    Home Medications Prior to Admission medications   Medication Sig Start Date End Date Taking? Authorizing Provider  acetaminophen (TYLENOL) 325 MG tablet Take 2 tablets (650 mg total) by mouth every 6 (six) hours as needed for moderate pain (or Fever >/= 101). 11/16/18  Yes Arrien, Jimmy Picket, MD  allopurinol (ZYLOPRIM) 100 MG tablet Take 1 tablet (100 mg total) by mouth daily. 05/06/20  Yes Burns, Claudina Lick, MD  aspirin (ECOTRIN LOW STRENGTH) 81 MG EC tablet Take 81 mg by mouth at bedtime.   Yes [provider]  Budesonide (PULMICORT FLEXHALER) 90 MCG/ACT inhaler Inhale 1 puff into the lungs See admin instructions. 1 puff daily and 1 additional puff as needed wheezing or shortness of breath   Yes [provider]   budesonide-formoterol (SYMBICORT) 160-4.5 MCG/ACT inhaler one - two inhalations every 12 hours; gargle and spit after use Patient taking differently: Inhale 2 puffs into the lungs 2 (two) times daily as needed (wheezing). gargle and spit after use 02/19/15  Yes Burns, Claudina Lick, MD  calcium carbonate (TUMS - DOSED IN MG ELEMENTAL CALCIUM) 500 MG chewable tablet Chew 2 tablets by mouth daily as needed for indigestion or heartburn.   Yes [provider]  carvedilol (COREG) 12.5 MG tablet TAKE 1 TABLET(12.5 MG) BY MOUTH TWICE DAILY WITH A MEAL 05/06/20  Yes Burns, Claudina Lick, MD  cholecalciferol (VITAMIN D3) 25 MCG (1000 UNIT) tablet Take 1 tablet (1,000 Units total) by mouth daily. 03/12/20  Yes Burns, Claudina Lick, MD  COD LIVER OIL PO Take 1 capsule by mouth daily.   Yes [provider]  colchicine 0.6 MG tablet Take 0.5 tablets (0.3 mg total) by mouth daily. 05/06/20  Yes Burns, Claudina Lick, MD  cyanocobalamin (,VITAMIN B-12,) 1000 MCG/ML injection INJECT 1ML INTO MUSCLE EVERY 30 DAYS 03/14/20  Yes Burns, Claudina Lick, MD  diclofenac sodium (VOLTAREN) 1 % GEL Apply 2 g topically 4 (four) times daily. 03/20/16  Yes Marcial Pacas, MD  famotidine (PEPCID) 20 MG tablet Take 1 tablet (20 mg total) by mouth 2 (two) times daily. Patient taking differently: Take 20 mg by mouth daily as needed for heartburn. 12/14/17  Yes [name], MD  ferrous sulfate 325 (65 FE) MG tablet Take 1 tablet (325 mg total) by mouth every Monday, Wednesday, and Friday. 03/03/19  Yes Fair, Marin Shutter, MD  folic acid (FOLVITE) 628 MCG tablet Take 1 tablet (400 mcg total) by mouth at bedtime. Take 400 mcg by mouth at bedtime. 03/12/20  Yes Burns, Claudina Lick, MD  furosemide (LASIX) 80 MG tablet TAKE 1 TABLET(80 MG) BY MOUTH TWICE DAILY Patient taking differently: 80 mg 3 (three) times daily as needed for fluid or edema. 04/12/19  Yes Fair, Marin Shutter, MD  gabapentin (NEURONTIN) 100 MG capsule Take 1 capsule (100 mg total) by mouth 3 (three) times  daily. 11/07/18 02/04/19  Yes [name], MD  hydrOXYzine (ATARAX/VISTARIL) 25 MG tablet Take 1 tablet (25 mg total) by mouth 3 (three) times daily as needed for itching. 03/01/19  Yes Fair, Marin Shutter, MD  montelukast (SINGULAIR) 10 MG tablet Take 1 tablet (10 mg total) by mouth at bedtime. 03/12/20  Yes Burns, Claudina Lick, MD  Oxcarbazepine (TRILEPTAL) 300 MG tablet Take 1 tablet (300 mg total) by mouth 2 (two) times daily. 11/07/18  Yes Burns, Claudina Lick, MD  oxyCODONE (OXY IR/ROXICODONE) 5 MG immediate release tablet Take 1 tablet (5 mg total) by mouth every 6 (six) hours as needed (chronic knee and back pain). 04/26/20  Yes Burns, Claudina Lick, MD  polyethylene glycol (MIRALAX / GLYCOLAX) 17 g packet Take 17 g by mouth daily. 03/02/19  Yes Fair, Marin Shutter, MD  potassium chloride SA (KLOR-CON) 20 MEQ tablet Take 1 tablet (20 mEq total) by mouth 3 (three) times daily. 05/06/20  Yes Burns, Claudina Lick, MD  senna-docusate (SENOKOT-S) 8.6-50 MG tablet Take 1 tablet by mouth at bedtime as needed for mild constipation. 03/01/19  Yes Fair, Marin Shutter, MD  simvastatin (ZOCOR) 40 MG tablet Take 1 tablet (40 mg total) by mouth at bedtime. 05/06/20  Yes Burns, Claudina Lick, MD  spironolactone (ALDACTONE) 25 MG tablet Take 1 tablet (25 mg total) by mouth daily. 04/09/20  Yes Burns, Claudina Lick, MD  VENTOLIN HFA 108 (90 Base) MCG/ACT inhaler Inhale 2 puffs into the lungs every 4 (four) hours as needed for wheezing. 11/13/19  Yes [provider]    Allergies    Penicillins, Shellfish allergy, Sulfonamide derivatives, Hydrochlorothiazide w-triamterene, Lotensin [benazepril hcl], Dipyridamole, Estrogens, Hydrochlorothiazide, Latex, Metronidazole, Other, Spironolactone, Sulfa antibiotics, Torsemide, Valsartan, and Mustard [allyl isothiocyanate]  Review of Systems   Review of Systems  Constitutional: Positive for fatigue. Negative for fever.  HENT: Positive for congestion. Negative for sore throat.   Eyes: Negative for visual disturbance.   Respiratory: Positive for cough, shortness of breath and wheezing.   Cardiovascular: Positive for chest pain and leg swelling.  Gastrointestinal: Negative for abdominal pain, nausea and vomiting.  Genitourinary: Negative for difficulty urinating.  Musculoskeletal: Positive for arthralgias. Negative for back pain and neck pain.  Skin: Negative for rash.  Neurological: Positive for light-headedness and headaches (since being in the ED). Negative for syncope.    Physical Exam Updated Vital Signs BP (!) 115/51   Pulse 89   Temp 99.1 F (37.3 C) (Oral)   Resp 11   SpO2 98%   Physical Exam Vitals and nursing note reviewed.  Constitutional:      General: She is not in acute distress.    Appearance: She is well-developed. She is not diaphoretic.  HENT:     Head: Normocephalic and atraumatic.  Eyes:     Conjunctiva/sclera: Conjunctivae normal.  Cardiovascular:     Rate and Rhythm: Normal rate and regular rhythm.     Heart sounds: Normal heart sounds. No murmur heard. No friction rub. No gallop.   Pulmonary:     Effort: Pulmonary effort is normal. No respiratory distress.     Breath sounds: Normal breath sounds. No wheezing or rales.  Abdominal:     General: There is no distension.     Palpations: Abdomen is soft.     Tenderness: There is no abdominal tenderness. There is no guarding.  Musculoskeletal:        General: No tenderness.     Cervical back: Normal range of motion.  Skin:  General: Skin is warm and dry.     Findings: No erythema or rash.  Neurological:     Mental Status: She is alert and oriented to person, place, and time.     ED Results / Procedures / Treatments   Labs (all labs ordered are listed, but only abnormal results are displayed) Labs Reviewed  BASIC METABOLIC PANEL - Abnormal; Notable for the following components:      Result Value   Chloride 96 (*)    CO2 34 (*)    Glucose, Bld 108 (*)    BUN 29 (*)    Creatinine, Ser 1.48 (*)    GFR,  Estimated 38 (*)    All other components within normal limits  CBG MONITORING, ED - Abnormal; Notable for the following components:   Glucose-Capillary 102 (*)    All other components within normal limits  I-STAT ARTERIAL BLOOD GAS, ED - Abnormal; Notable for the following components:   pCO2 arterial 61.8 (*)    pO2, Arterial 112 (*)    Bicarbonate 36.4 (*)    TCO2 38 (*)    Acid-Base Excess 9.0 (*)    All other components within normal limits  TROPONIN I (HIGH SENSITIVITY) - Abnormal; Notable for the following components:   Troponin I (High Sensitivity) 22 (*)    All other components within normal limits  RESP PANEL BY RT-PCR (FLU A&B, COVID) ARPGX2  CBC WITH DIFFERENTIAL/PLATELET  BRAIN NATRIURETIC PEPTIDE  BLOOD GAS, ARTERIAL  SEDIMENTATION RATE  TROPONIN I (HIGH SENSITIVITY)  TROPONIN I (HIGH SENSITIVITY)    EKG EKG Interpretation  Date/Time:  Tuesday May 07 2020 21:52:54 EDT Ventricular Rate:  86 PR Interval:  167 QRS Duration: 89 QT Interval:  403 QTC Calculation: 482 R Axis:   32 Text Interpretation: Sinus rhythm Abnormal R-wave progression, early transition When compared to prior, similar appearance. No STEMI Confirmed by Antony Blackbird [phone]) on 05/08/2020 1:16:22 AM   Radiology DG Chest 2 View  Result Date: 05/07/2020 CLINICAL DATA:  Short of breath.  History CHF EXAM: CHEST - 2 VIEW COMPARISON:  02/23/2019 FINDINGS: Heart size within normal limits. Negative for heart failure or edema. No effusion. Mild bibasilar atelectasis. IMPRESSION: Mild bibasilar atelectasis. Electronically Signed   By: Franchot Gallo M.D.   On: 05/07/2020 19:05   CT Angio Chest PE W and/or Wo Contrast  Result Date: 05/07/2020 CLINICAL DATA:  Shortness of breath EXAM: CT ANGIOGRAPHY CHEST WITH CONTRAST TECHNIQUE: Multidetector CT imaging of the chest was performed using the standard protocol during bolus administration of intravenous contrast. Multiplanar CT image reconstructions and MIPs  were obtained to evaluate the vascular anatomy. CONTRAST:  140mL OMNIPAQUE IOHEXOL 350 MG/ML SOLN COMPARISON:  Radiograph same day FINDINGS: Cardiovascular: There is slightly suboptimal opacification of the main pulmonary artery however no central or proximal segmental pulmonary embolism is seen. There is prominence of the right main pulmonary artery which could be due to pulmonary arterial hypertension. There is mild cardiomegaly. Coronary artery calcifications are seen. No pericardial effusion or thickening. No evidence right heart strain. There is normal three-vessel brachiocephalic anatomy without proximal stenosis. Scattered aortic atherosclerosis is noted. Mediastinum/Nodes: No hilar, mediastinal, or axillary adenopathy. Thyroid gland, trachea, and esophagus demonstrate no significant findings. Lungs/Pleura: Minimal ground-glass opacity seen at both lung bases. No pleural effusion or pneumothorax. No airspace consolidation. Upper Abdomen: No acute abnormalities present in the visualized portions of the upper abdomen. A small hiatal hernia is present. Musculoskeletal: No chest wall abnormality. No acute or significant  osseous findings. Degenerative changes are seen in midthoracic spine. Review of the MIP images confirms the above findings. IMPRESSION: Slightly suboptimal opacification of the main pulmonary artery however no central or proximal segmental pulmonary embolism Mild cardiomegaly in findings suggestive of pulmonary arterial hypertension Minimal ground-glass opacity at both lung bases which may be due to atelectasis Small hiatal hernia Aortic Atherosclerosis (ICD10-I70.0). Electronically Signed   By: Prudencio Pair M.D.   On: 05/07/2020 23:59    Procedures Procedures   Medications Ordered in ED Medications  oxyCODONE (Oxy IR/ROXICODONE) immediate release tablet 5 mg (has no administration in time range)  enoxaparin (LOVENOX) injection 40 mg (40 mg Subcutaneous Given 05/08/20 1006)  sodium  chloride flush (NS) 0.9 % injection 3 mL (has no administration in time range)  clindamycin (CLEOCIN) IVPB 600 mg (has no administration in time range)  ondansetron (ZOFRAN) tablet 4 mg (has no administration in time range)    Or  ondansetron (ZOFRAN) injection 4 mg (has no administration in time range)  albuterol (PROVENTIL) (2.5 MG/3ML) 0.083% nebulizer solution 2.5 mg (has no administration in time range)  acetaminophen (TYLENOL) tablet 650 mg (650 mg Oral Given 05/07/20 2054)  iohexol (OMNIPAQUE) 350 MG/ML injection 100 mL (100 mLs Intravenous Contrast Given 05/07/20 2353)  clindamycin (CLEOCIN) IVPB 600 mg (0 mg Intravenous Stopped 05/08/20 0407)  fentaNYL (SUBLIMAZE) injection 50 mcg (50 mcg Intravenous Given 05/08/20 0407)    ED Course  I have reviewed the triage vital signs and the nursing notes.  Pertinent labs & imaging results that were available during my care of the patient were reviewed by me and considered in my medical decision making (see chart for details).    MDM Rules/Calculators/A&P                          70 year old female with a history of asthma, CHF, DM, hypertension, hyperlipidemia, right lower extremity wound, neuropathy, who presents with concern for shortness of breath.  Differential diagnosis for dyspnea includes ACS, PE, COPD exacerbation, CHF exacerbation, anemia, pneumonia, viral etiology such as COVID 19 infection, metabolic abnormality.  Chest x-ray was done which showed no acute abnormalities, atelectasis. EKG was evaluated by me which showed no acute changes.  BNP was WNL.  Describes atypical chest pain however troponin have increased from 12 to 22.  Has bilateral LE edema and erythema with right side with tenderness, consider DVT.  Ordered CT PE study which is pending at time of transfer of care.    Final Clinical Impression(s) / ED Diagnoses Final diagnoses:  Cellulitis, unspecified cellulitis site  Hypoxia  Shortness of breath    Rx / DC Orders ED  Discharge Orders    None       Gareth Morgan, MD 05/08/20 1142

## 2020-05-07 NOTE — Telephone Encounter (Signed)
Message left with verbals.

## 2020-05-07 NOTE — Telephone Encounter (Signed)
Sob, leg and feet swelling. Last time pt  weighed was end of Jan pt states weight is up 30lbs... please advise

## 2020-05-07 NOTE — Telephone Encounter (Signed)
Pt called c/o sob, wheezing, swelling in feet, legs and abdomen. Pt said her weight is up 30lbs from January. She does not weigh herself because it is difficult to get to the scale. Per Adline Potter patient needs to be evaluated in the emergency room.  Pt aware and agreeable with plan.

## 2020-05-07 NOTE — Telephone Encounter (Signed)
Anda Kraft a physical therapist from [facility] calling, requesting verbal orders for home health PT, states they are going to hold off for two weeks then they would like to see her 1 time a week for 6 weeks Anda Kraft- [phone] Ok to lvm

## 2020-05-07 NOTE — ED Triage Notes (Signed)
Emergency Medicine Provider Triage Evaluation Note  April [name] , a 70 y.o. female  was evaluated in triage.  Pt complains of shortness of breath with worsening lower extremity edema. History of CHF on lasix which she has been compliant with. Intermittent chest pain.   Review of Systems  Positive: Lower extremity edema, SOB, CP   Physical Exam  BP 140/72 (BP Location: Left Wrist)   Pulse 89   Temp 99.1 F (37.3 C) (Oral)   Resp 19   SpO2 95%  Gen:   Awake, no distress   HEENT:  Atraumatic  Resp:  Normal effort Cardiac:  Normal rate  Abd:   Nondistended, nontender  MSK:   Bilateral lower extremity edema Neuro:  Speech clear   Medical Decision Making  Medically screening exam initiated at 5:54 PM.  Appropriate orders placed.  April [name] was informed that the remainder of the evaluation will be completed by another provider, this initial triage assessment does not replace that evaluation, and the importance of remaining in the ED until their evaluation is complete.  Clinical Impression  SOB with lower extremity edema and chest pain. Concern for CHF exacerbation. Labs ordered.    April [name], [place] 05/07/20 1756

## 2020-05-07 NOTE — Telephone Encounter (Signed)
Scheduled appointment to discuss medications 05/24/20 @ 2:00pm.

## 2020-05-07 NOTE — ED Provider Notes (Signed)
11:55 PM Care assumed from Dr. Billy Fischer.  At time of transfer of care, patient is awaiting for results of PE study, delta Trope, and reevaluation.  Patient has had shortness of breath and some asymmetric leg edema as well as cough.  Reportedly patient had oxygen saturation in the 80s with ambulation before arrival.  Plan on reassessing patient after imaging studies as well as Covid test and delta troponin to determine disposition this evening.   2:11 AM While resting and waiting for work-up to return, oxygen saturation remained steady in the mid 80s and patient was short of breath.  Patient was placed on 2 L nasal cannula with improved oxygen saturations.  Her CT returned showing no evidence of pulmonary ballismus.  There was some atelectasis but Covid and flu test negative.  Troponin slightly elevated but only minimally.  No leukocytosis, doubt pneumonia this time.  Creatinine similar to prior.  Electrolytes also somewhat similar to prior.  BNP not significant elevated.  I assessed the patient myself and was her legs, she has very warm and red legs bilaterally with right worse than left.  It is diffusely tender and I am concerned about cellulitis near some previous wounds.  Will cover with IV antibiotics and will admit for further management of diabetic cellulitis in both legs that appears moderate to severe.  We will still get the DVT ultrasounds with the previous team requested although they will need to be in the morning.  As patient also was hypoxic, she will need to be admitted for further respiratory monitoring.  On my respiratory exam, she did not have significant wheezing so we will hold on albuterol at this time.  We will discuss with hospitalist admission plans.   Spoke to hospitalist service who will admit for further management of new hypoxia with oxygen requirement as well as leg cellulitis.  Clinical Impression: 1. Cellulitis, unspecified cellulitis site   2. SOB (shortness of breath)    3. Hypoxia     Disposition: Admit  This note was prepared with assistance of Dragon voice recognition software. Occasional wrong-word or sound-a-like substitutions may have occurred due to the inherent limitations of voice recognition software.     Teresha Hanks, Gwenyth Allegra, MD 05/08/20 [phone]

## 2020-05-07 NOTE — Telephone Encounter (Signed)
ok 

## 2020-05-07 NOTE — ED Triage Notes (Signed)
Pt here POV with reports of shob for several days. Pt reports BLE edema and abdominal distention. Pt with hx CHF, denies missing any doses of diuretic. Pt talking in complete sentences.

## 2020-05-08 ENCOUNTER — Inpatient Hospital Stay (HOSPITAL_COMMUNITY): Payer: Medicare PPO

## 2020-05-08 DIAGNOSIS — M1710 Unilateral primary osteoarthritis, unspecified knee: Secondary | ICD-10-CM | POA: Diagnosis present

## 2020-05-08 DIAGNOSIS — R0602 Shortness of breath: Secondary | ICD-10-CM | POA: Diagnosis not present

## 2020-05-08 DIAGNOSIS — E662 Morbid (severe) obesity with alveolar hypoventilation: Secondary | ICD-10-CM | POA: Diagnosis present

## 2020-05-08 DIAGNOSIS — Z6841 Body Mass Index (BMI) 40.0 and over, adult: Secondary | ICD-10-CM | POA: Diagnosis not present

## 2020-05-08 DIAGNOSIS — I2510 Atherosclerotic heart disease of native coronary artery without angina pectoris: Secondary | ICD-10-CM | POA: Diagnosis present

## 2020-05-08 DIAGNOSIS — E1122 Type 2 diabetes mellitus with diabetic chronic kidney disease: Secondary | ICD-10-CM | POA: Diagnosis present

## 2020-05-08 DIAGNOSIS — S40021A Contusion of right upper arm, initial encounter: Secondary | ICD-10-CM | POA: Diagnosis present

## 2020-05-08 DIAGNOSIS — J9602 Acute respiratory failure with hypercapnia: Secondary | ICD-10-CM

## 2020-05-08 DIAGNOSIS — I82403 Acute embolism and thrombosis of unspecified deep veins of lower extremity, bilateral: Secondary | ICD-10-CM | POA: Diagnosis present

## 2020-05-08 DIAGNOSIS — L03119 Cellulitis of unspecified part of limb: Secondary | ICD-10-CM

## 2020-05-08 DIAGNOSIS — E785 Hyperlipidemia, unspecified: Secondary | ICD-10-CM | POA: Diagnosis present

## 2020-05-08 DIAGNOSIS — I5033 Acute on chronic diastolic (congestive) heart failure: Secondary | ICD-10-CM | POA: Diagnosis present

## 2020-05-08 DIAGNOSIS — Z87442 Personal history of urinary calculi: Secondary | ICD-10-CM | POA: Diagnosis not present

## 2020-05-08 DIAGNOSIS — I272 Pulmonary hypertension, unspecified: Secondary | ICD-10-CM | POA: Diagnosis present

## 2020-05-08 DIAGNOSIS — G4733 Obstructive sleep apnea (adult) (pediatric): Secondary | ICD-10-CM | POA: Diagnosis not present

## 2020-05-08 DIAGNOSIS — M79606 Pain in leg, unspecified: Secondary | ICD-10-CM

## 2020-05-08 DIAGNOSIS — J9601 Acute respiratory failure with hypoxia: Secondary | ICD-10-CM | POA: Diagnosis present

## 2020-05-08 DIAGNOSIS — E11628 Type 2 diabetes mellitus with other skin complications: Secondary | ICD-10-CM | POA: Diagnosis present

## 2020-05-08 DIAGNOSIS — X58XXXA Exposure to other specified factors, initial encounter: Secondary | ICD-10-CM | POA: Diagnosis present

## 2020-05-08 DIAGNOSIS — Z20822 Contact with and (suspected) exposure to covid-19: Secondary | ICD-10-CM | POA: Diagnosis present

## 2020-05-08 DIAGNOSIS — M7989 Other specified soft tissue disorders: Secondary | ICD-10-CM

## 2020-05-08 DIAGNOSIS — Z86010 Personal history of colonic polyps: Secondary | ICD-10-CM | POA: Diagnosis not present

## 2020-05-08 DIAGNOSIS — Z8711 Personal history of peptic ulcer disease: Secondary | ICD-10-CM | POA: Diagnosis not present

## 2020-05-08 DIAGNOSIS — E782 Mixed hyperlipidemia: Secondary | ICD-10-CM

## 2020-05-08 DIAGNOSIS — G609 Hereditary and idiopathic neuropathy, unspecified: Secondary | ICD-10-CM | POA: Diagnosis present

## 2020-05-08 DIAGNOSIS — E538 Deficiency of other specified B group vitamins: Secondary | ICD-10-CM | POA: Diagnosis present

## 2020-05-08 DIAGNOSIS — I890 Lymphedema, not elsewhere classified: Secondary | ICD-10-CM | POA: Diagnosis present

## 2020-05-08 DIAGNOSIS — N1832 Chronic kidney disease, stage 3b: Secondary | ICD-10-CM | POA: Diagnosis present

## 2020-05-08 DIAGNOSIS — I130 Hypertensive heart and chronic kidney disease with heart failure and stage 1 through stage 4 chronic kidney disease, or unspecified chronic kidney disease: Secondary | ICD-10-CM | POA: Diagnosis present

## 2020-05-08 DIAGNOSIS — R0902 Hypoxemia: Secondary | ICD-10-CM

## 2020-05-08 DIAGNOSIS — J45901 Unspecified asthma with (acute) exacerbation: Secondary | ICD-10-CM | POA: Diagnosis present

## 2020-05-08 DIAGNOSIS — Z8616 Personal history of COVID-19: Secondary | ICD-10-CM | POA: Diagnosis not present

## 2020-05-08 DIAGNOSIS — K219 Gastro-esophageal reflux disease without esophagitis: Secondary | ICD-10-CM | POA: Diagnosis present

## 2020-05-08 LAB — I-STAT ARTERIAL BLOOD GAS, ED
Acid-Base Excess: 9 mmol/L — ABNORMAL HIGH (ref 0.0–2.0)
Bicarbonate: 36.4 mmol/L — ABNORMAL HIGH (ref 20.0–28.0)
Calcium, Ion: 1.24 mmol/L (ref 1.15–1.40)
HCT: 37 % (ref 36.0–46.0)
Hemoglobin: 12.6 g/dL (ref 12.0–15.0)
O2 Saturation: 98 %
Patient temperature: 99.1
Potassium: 4.1 mmol/L (ref 3.5–5.1)
Sodium: 139 mmol/L (ref 135–145)
TCO2: 38 mmol/L — ABNORMAL HIGH (ref 22–32)
pCO2 arterial: 61.8 mmHg — ABNORMAL HIGH (ref 32.0–48.0)
pH, Arterial: 7.379 (ref 7.350–7.450)
pO2, Arterial: 112 mmHg — ABNORMAL HIGH (ref 83.0–108.0)

## 2020-05-08 LAB — TROPONIN I (HIGH SENSITIVITY): Troponin I (High Sensitivity): 15 ng/L (ref <18)

## 2020-05-08 LAB — RESP PANEL BY RT-PCR (FLU A&B, COVID) ARPGX2
Influenza A by PCR: NEGATIVE
Influenza B by PCR: NEGATIVE
SARS Coronavirus 2 by RT PCR: NEGATIVE

## 2020-05-08 LAB — SEDIMENTATION RATE: Sed Rate: 13 mm/hr (ref 0–22)

## 2020-05-08 LAB — GLUCOSE, CAPILLARY: Glucose-Capillary: 107 mg/dL — ABNORMAL HIGH (ref 70–99)

## 2020-05-08 MED ORDER — MONTELUKAST SODIUM 10 MG PO TABS
10.0000 mg | ORAL_TABLET | Freq: Every day | ORAL | Status: DC
  Administered 2020-05-08 – 2020-05-15 (×8): 10 mg via ORAL
  Filled 2020-05-08 (×9): qty 1

## 2020-05-08 MED ORDER — FUROSEMIDE 80 MG PO TABS
80.0000 mg | ORAL_TABLET | Freq: Two times a day (BID) | ORAL | Status: DC
  Administered 2020-05-08 – 2020-05-09 (×2): 80 mg via ORAL
  Filled 2020-05-08: qty 1
  Filled 2020-05-08: qty 4

## 2020-05-08 MED ORDER — POTASSIUM CHLORIDE CRYS ER 20 MEQ PO TBCR
20.0000 meq | EXTENDED_RELEASE_TABLET | Freq: Every day | ORAL | Status: DC
  Administered 2020-05-08 – 2020-05-15 (×8): 20 meq via ORAL
  Filled 2020-05-08 (×8): qty 1

## 2020-05-08 MED ORDER — GABAPENTIN 100 MG PO CAPS
100.0000 mg | ORAL_CAPSULE | Freq: Three times a day (TID) | ORAL | Status: DC
  Administered 2020-05-08 – 2020-05-15 (×23): 100 mg via ORAL
  Filled 2020-05-08 (×23): qty 1

## 2020-05-08 MED ORDER — ALBUTEROL SULFATE (2.5 MG/3ML) 0.083% IN NEBU: 2.5000 mg | INHALATION_SOLUTION | Freq: Four times a day (QID) | RESPIRATORY_TRACT | Status: DC | PRN

## 2020-05-08 MED ORDER — ENOXAPARIN SODIUM 300 MG/3ML IJ SOLN
1.0000 mg/kg | Freq: Two times a day (BID) | INTRAMUSCULAR | Status: DC
  Administered 2020-05-09 – 2020-05-12 (×7): 170 mg via SUBCUTANEOUS
  Filled 2020-05-08 (×9): qty 1.7

## 2020-05-08 MED ORDER — SPIRONOLACTONE 25 MG PO TABS
25.0000 mg | ORAL_TABLET | Freq: Every day | ORAL | Status: DC
  Administered 2020-05-08 – 2020-05-15 (×8): 25 mg via ORAL
  Filled 2020-05-08 (×9): qty 1

## 2020-05-08 MED ORDER — COLCHICINE 0.6 MG PO TABS
0.3000 mg | ORAL_TABLET | Freq: Every day | ORAL | Status: DC
  Filled 2020-05-08: qty 0.5

## 2020-05-08 MED ORDER — CYANOCOBALAMIN 1000 MCG/ML IJ SOLN
1000.0000 ug | INTRAMUSCULAR | Status: DC
  Administered 2020-05-08: 1000 ug via INTRAMUSCULAR
  Filled 2020-05-08 (×2): qty 1

## 2020-05-08 MED ORDER — FENTANYL CITRATE (PF) 100 MCG/2ML IJ SOLN
50.0000 ug | Freq: Once | INTRAMUSCULAR | Status: AC
  Administered 2020-05-08: 50 ug via INTRAVENOUS
  Filled 2020-05-08: qty 2

## 2020-05-08 MED ORDER — CARVEDILOL 12.5 MG PO TABS
12.5000 mg | ORAL_TABLET | Freq: Two times a day (BID) | ORAL | Status: DC
  Administered 2020-05-08 – 2020-05-15 (×14): 12.5 mg via ORAL
  Filled 2020-05-08 (×15): qty 1

## 2020-05-08 MED ORDER — ARFORMOTEROL TARTRATE 15 MCG/2ML IN NEBU
15.0000 ug | INHALATION_SOLUTION | Freq: Two times a day (BID) | RESPIRATORY_TRACT | Status: DC
  Administered 2020-05-08 – 2020-05-15 (×15): 15 ug via RESPIRATORY_TRACT
  Filled 2020-05-08 (×18): qty 2

## 2020-05-08 MED ORDER — FERROUS SULFATE 325 (65 FE) MG PO TABS
325.0000 mg | ORAL_TABLET | ORAL | Status: DC
  Administered 2020-05-08 – 2020-05-15 (×4): 325 mg via ORAL
  Filled 2020-05-08 (×5): qty 1

## 2020-05-08 MED ORDER — BUDESONIDE 0.5 MG/2ML IN SUSP
0.5000 mg | Freq: Two times a day (BID) | RESPIRATORY_TRACT | Status: DC
  Administered 2020-05-08 – 2020-05-15 (×15): 0.5 mg via RESPIRATORY_TRACT
  Filled 2020-05-08 (×18): qty 2

## 2020-05-08 MED ORDER — ALLOPURINOL 100 MG PO TABS
100.0000 mg | ORAL_TABLET | Freq: Every day | ORAL | Status: DC
  Administered 2020-05-08 – 2020-05-15 (×8): 100 mg via ORAL
  Filled 2020-05-08 (×9): qty 1

## 2020-05-08 MED ORDER — CLINDAMYCIN PHOSPHATE 600 MG/50ML IV SOLN
600.0000 mg | Freq: Three times a day (TID) | INTRAVENOUS | Status: DC
  Administered 2020-05-08 – 2020-05-12 (×12): 600 mg via INTRAVENOUS
  Filled 2020-05-08 (×14): qty 50

## 2020-05-08 MED ORDER — SACCHAROMYCES BOULARDII 250 MG PO CAPS
250.0000 mg | ORAL_CAPSULE | Freq: Two times a day (BID) | ORAL | Status: DC
  Administered 2020-05-08 – 2020-05-15 (×15): 250 mg via ORAL
  Filled 2020-05-08 (×16): qty 1

## 2020-05-08 MED ORDER — ENOXAPARIN SODIUM 40 MG/0.4ML ~~LOC~~ SOLN
40.0000 mg | SUBCUTANEOUS | Status: DC
  Administered 2020-05-08: 40 mg via SUBCUTANEOUS
  Filled 2020-05-08: qty 0.4

## 2020-05-08 MED ORDER — OXCARBAZEPINE 300 MG PO TABS
300.0000 mg | ORAL_TABLET | Freq: Two times a day (BID) | ORAL | Status: DC
  Administered 2020-05-08 – 2020-05-15 (×15): 300 mg via ORAL
  Filled 2020-05-08 (×16): qty 1

## 2020-05-08 MED ORDER — ONDANSETRON HCL 4 MG/2ML IJ SOLN
4.0000 mg | Freq: Four times a day (QID) | INTRAMUSCULAR | Status: DC | PRN
  Administered 2020-05-15: 4 mg via INTRAVENOUS
  Filled 2020-05-08: qty 2

## 2020-05-08 MED ORDER — HYDROXYZINE HCL 25 MG PO TABS
25.0000 mg | ORAL_TABLET | Freq: Three times a day (TID) | ORAL | Status: DC | PRN
  Administered 2020-05-09 – 2020-05-13 (×4): 25 mg via ORAL
  Filled 2020-05-08 (×4): qty 1

## 2020-05-08 MED ORDER — ONDANSETRON HCL 4 MG PO TABS: 4.0000 mg | ORAL_TABLET | Freq: Four times a day (QID) | ORAL | Status: DC | PRN

## 2020-05-08 MED ORDER — FAMOTIDINE 20 MG PO TABS
20.0000 mg | ORAL_TABLET | Freq: Every day | ORAL | Status: DC
  Administered 2020-05-08 – 2020-05-15 (×8): 20 mg via ORAL
  Filled 2020-05-08 (×8): qty 1

## 2020-05-08 MED ORDER — FOLIC ACID 1 MG PO TABS
500.0000 ug | ORAL_TABLET | Freq: Every day | ORAL | Status: DC
  Administered 2020-05-08 – 2020-05-15 (×8): 0.5 mg via ORAL
  Filled 2020-05-08 (×9): qty 1

## 2020-05-08 MED ORDER — ENOXAPARIN SODIUM 150 MG/ML ~~LOC~~ SOLN
130.0000 mg | SUBCUTANEOUS | Status: AC
  Administered 2020-05-08: 130 mg via SUBCUTANEOUS
  Filled 2020-05-08: qty 0.86

## 2020-05-08 MED ORDER — OXYCODONE HCL 5 MG PO TABS
5.0000 mg | ORAL_TABLET | Freq: Four times a day (QID) | ORAL | Status: DC | PRN
  Administered 2020-05-08 – 2020-05-15 (×20): 5 mg via ORAL
  Filled 2020-05-08 (×20): qty 1

## 2020-05-08 MED ORDER — SODIUM CHLORIDE 0.9% FLUSH
3.0000 mL | Freq: Two times a day (BID) | INTRAVENOUS | Status: DC
  Administered 2020-05-08 – 2020-05-15 (×14): 3 mL via INTRAVENOUS

## 2020-05-08 MED ORDER — SIMVASTATIN 20 MG PO TABS
40.0000 mg | ORAL_TABLET | Freq: Every day | ORAL | Status: DC
  Administered 2020-05-08 – 2020-05-15 (×8): 40 mg via ORAL
  Filled 2020-05-08 (×8): qty 2

## 2020-05-08 MED ORDER — CLINDAMYCIN PHOSPHATE 600 MG/50ML IV SOLN
600.0000 mg | Freq: Once | INTRAVENOUS | Status: AC
  Administered 2020-05-08: 600 mg via INTRAVENOUS
  Filled 2020-05-08: qty 50

## 2020-05-08 MED ORDER — SENNOSIDES-DOCUSATE SODIUM 8.6-50 MG PO TABS
1.0000 | ORAL_TABLET | Freq: Every evening | ORAL | Status: DC | PRN
  Administered 2020-05-11 – 2020-05-12 (×2): 1 via ORAL
  Filled 2020-05-08 (×3): qty 1

## 2020-05-08 NOTE — Progress Notes (Signed)
Patient room is ready. Report received.

## 2020-05-08 NOTE — ED Notes (Signed)
Pt placed on 2L [place] due to O2 maintaining at 85%.

## 2020-05-08 NOTE — ED Notes (Signed)
Attempted to call report

## 2020-05-08 NOTE — Progress Notes (Signed)
Reviewed case, c/w OSA/OHS with secondary pulmonary hypertension.  Will get her in to clinic to expedite sleep study and PAP if she is willing.  Erskine Emery MD PCCM

## 2020-05-08 NOTE — Progress Notes (Signed)
Lower extremity venous bilateral study completed.  Preliminary results relayed to Smith, MD.  See CV Proc for preliminary results report.   Axtyn Woehler, RDMS, RVT  

## 2020-05-08 NOTE — H&P (Addendum)
History and Physical    April [name] VUD:[id number] DOB: [birth year] DOA: 05/07/2020  Referring MD/NP/PA: Jennette Kettle, MD PCP: [name], MD  Consultants: [facility] Patient coming from: Home via EMS  Chief Complaint: Shortness of breath and leg pain.  I have personally briefly reviewed patient's old medical records in [place]   HPI: April [name] is a 70 y.o. female with medical history significant of hypertension, hyperlipidemia, CAD, CHF, asthma, chronic kidney disease stage IIIb, diabetes mellitus type 2, cellulitis, anemia, and morbid obesity presents with complaints of shortness of breath and leg pain.  She complains of having progressively worsening shortness of breath.  Patient reported that she was unable to get dressed or walk to the bathroom without feeling extremely short of breath.  Reports intermittent wheezing and a mildly productive cough with yellowish sputum production over the last 1 week.  She states that her legs have been swelling right leg worse than the left and intermittently causing her pain.  The other day she acutely noticed that her legs were red in color.  At baseline she uses a walker to ambulate short distances and a wheelchair for longer distances.  It seems that the patient is mostly sedentary, but reports that she tries to elevate her legs when sitting down.  Since January of this year she has gained approximately 30 pounds despite trying to eat healthy and avoiding salt.  She reports that she has been compliant with her home medications including Lasix.  Normally she is not on oxygen at baseline and does not use CPAP or BiPAP at night. She had call her doctors and they told her to   ED Course: Upon admission into the emergency department patient was seen to be afebrile, blood pressure 100/55-140/72, and O2 saturations noted to be as low as 85% currently maintained on 2 L of oxygen.  Labs significant for chloride 96, CO2 34, BUN  29, and creatinine 1.48.  Angiogram of the chest with contrast was obtained showing no clear signs of PE, mild cardiomegaly, pulmonary artery hypertension, small hiatal hernia, and likely atelectasis.  Influenza and COVID-19 screening were negative.  Patient had been started on clindamycin IV. Marland Kitchen Review of Systems  Constitutional: Positive for malaise/fatigue. Negative for fever.  HENT: Negative for congestion and nosebleeds.   Eyes: Negative for photophobia and pain.  Respiratory: Positive for cough, shortness of breath and wheezing.   Cardiovascular: Positive for chest pain and leg swelling.  Gastrointestinal: Negative for abdominal pain, nausea and vomiting.  Genitourinary: Negative for dysuria and hematuria.  Musculoskeletal: Positive for myalgias. Negative for falls.  Skin:       Positive for skin color change  Neurological: Positive for headaches. Negative for focal weakness and loss of consciousness.  Psychiatric/Behavioral: Negative for substance abuse.    Past Medical History:  Diagnosis Date  . Anemia   . Asthma   . CAD (coronary artery disease)   . Carpal tunnel syndrome   . Cellulitis of both lower extremities 04/11/2015  . CHF (congestive heart failure) ([place])   . Chronic kidney disease    [facility]- followed by [facility]  . Colon polyp, hyperplastic 2007 & 2012  . Complication of anesthesia 1999   svt with renal calculi surgery, no problems since  . CTS (carpal tunnel syndrome)    bilateral  . Diabetes mellitus   . Eczema   . GERD (gastroesophageal reflux disease)   . History of kidney stones 1999  . Hyperlipidemia   .  Hypertension   . Leg ulcer ([place]) 04/24/2015   Right lateral leg No evidence of an infection Monitor closely Keep edema controlled   . Leg ulcer ([place])    right lower  . Meralgia paresthetica    Dr. [name]  . Morbid obesity ([place])   . Morbid obesity ([place])   . Neuropathy    toes and legs  . Osteoarthrosis, unspecified whether generalized or localized,  lower leg    knee  . PUD (peptic ulcer disease)   . Shortness of breath dyspnea    with exertion  . Sleep apnea    per progress note 02/25/2018  . Type II or unspecified type diabetes mellitus without mention of complication, not stated as uncontrolled   . Unspecified hereditary and idiopathic peripheral neuropathy   . Urticaria   . Vitamin B12 deficiency   . Wears glasses   . Wound cellulitis    right upper leg, healing well    Past Surgical History:  Procedure Laterality Date  . ABDOMINAL HYSTERECTOMY    . BREAST BIOPSY Right 07/08/2018  . CARDIAC CATHETERIZATION  2002   non obstructive disease  . colonoscopy with polypectomy  2007 & 2012    hyperplastic ;Dr Watt Climes  . COLONOSCOPY WITH PROPOFOL N/A 06/04/2015   Procedure: COLONOSCOPY WITH PROPOFOL;  Surgeon: Jerene Bears, MD;  Location: [facility];  Service: Gastroenterology;  Laterality: N/A;  . DEBRIDEMENT LEG Right 03/02/2018   WOUND VAC APPLIED  . DEBRIDEMENT LEG Right 05/27/2018   RIGHT LOWER LEG DEBRIDEMENT,  SKIN GRAFT, VAC PLACEMENT   . DILATION AND CURETTAGE OF UTERUS     multiple  . HEMORRHOID SURGERY    . I & D EXTREMITY Right 03/02/2018   Procedure: RIGHT LEG DEBRIDEMENT AND PLACE VAC;  Surgeon: [name], MD;  Location: [place];  Service: Orthopedics;  Laterality: Right;  . I & D EXTREMITY Right 03/04/2018   Procedure: REPEAT IRRIGATION AND DEBRIDEMENT RIGHT LEG, PLACE WOUND VAC;  Surgeon: [name], MD;  Location: [place];  Service: Orthopedics;  Laterality: Right;  . I & D EXTREMITY Right 03/30/2018   Procedure: IRRIGATION AND DEBRIDEMENT RIGHT LEG, APPLY WOUND VAC;  Surgeon: [name], MD;  Location: [place];  Service: Orthopedics;  Laterality: Right;  . I & D EXTREMITY Right 05/27/2018   Procedure: RIGHT LOWER LEG DEBRIDEMENT,  SKIN GRAFT, VAC PLACEMENT;  Surgeon: [name], MD;  Location: [place];  Service: Orthopedics;  Laterality: Right;  RIGHT LOWER LEG DEBRIDEMENT,  SKIN GRAFT, VAC PLACEMENT  .  renal calculi  12/1997   SVT with induction of anesthesia  . right knee arthroscopy    . SKIN SPLIT GRAFT Right 03/04/2018   Procedure: POSSIBLE SPLIT THICKNESS SKIN GRAFT;  Surgeon: [name], MD;  Location: [place];  Service: Orthopedics;  Laterality: Right;  . SKIN SPLIT GRAFT Right 04/01/2018   Procedure: REPEAT IRRIGATION AND DEBRIDEMENT RIGHT LEG, APPLY SPLIT THICKNESS SKIN GRAFT;  Surgeon: [name], MD;  Location: [place];  Service: Orthopedics;  Laterality: Right;  . TONSILLECTOMY AND ADENOIDECTOMY       reports that she has never smoked. She has never used smokeless tobacco. She reports that she does not drink alcohol and does not use drugs.  Allergies  Allergen Reactions  . Penicillins Rash and Other (See Comments)    She was told not to take it anymore. Has patient had a PCN reaction causing immediate rash, facial/tongue/throat swelling, SOB or lightheadedness with  hypotension: Yes Has patient had a PCN reaction causing severe rash involving mucus membranes or skin necrosis: No Has patient had a PCN reaction that required hospitalization: No Has patient had a PCN reaction occurring within the last 10 years: No If all of the above answers are "NO", then may proceed with Cephalosporin use.'  . Shellfish Allergy Anaphylaxis  . Sulfonamide Derivatives Anaphylaxis and Other (See Comments)  . Hydrochlorothiazide W-Triamterene Other (See Comments)    Hypokalemia  . Lotensin [Benazepril Hcl] Hives  . Dipyridamole Other (See Comments)    Unknown reaction  . Estrogens Other (See Comments)    Unknown reaction  . Hydrochlorothiazide Other (See Comments)    Unknown reaction  . Metronidazole Other (See Comments)    Unknown reaction  . Other Itching    PICKLES  . Spironolactone Other (See Comments)    UNSPECIFIED > "kidney problems"  . Sulfa Antibiotics Other (See Comments)    Unknown reaction  . Torsemide Other (See Comments)    Unknown reaction  . Valsartan Other (See  Comments)    Unknown reaction  . Madelaine Bhat Isothiocyanate] Itching    Family History  Problem Relation Age of Onset  . Colon cancer Mother   . Prostate cancer Father   . Colon cancer Father   . Diabetes Maternal Aunt   . Breast cancer Maternal Aunt   . Diabetes Maternal Uncle   . Diabetes Paternal Aunt   . Stroke Paternal Aunt        > 65  . Heart disease Paternal Aunt   . Diabetes Paternal Uncle   . Breast cancer Maternal Aunt         X 2  . Breast cancer Cousin     Prior to Admission medications   Medication Sig Start Date End Date Taking? Authorizing Provider  acetaminophen (TYLENOL) 325 MG tablet Take 2 tablets (650 mg total) by mouth every 6 (six) hours as needed for moderate pain (or Fever >/= 101). 11/16/18   Arrien, Jimmy Picket, MD  allopurinol (ZYLOPRIM) 100 MG tablet Take 1 tablet (100 mg total) by mouth daily. 05/06/20   [name], MD  aspirin (ECOTRIN LOW STRENGTH) 81 MG EC tablet Take 81 mg by mouth at bedtime.    [provider]  Budesonide (PULMICORT FLEXHALER) 90 MCG/ACT inhaler Inhale 2 puffs into the lungs 2 (two) times daily.    [provider]  budesonide-formoterol (SYMBICORT) 160-4.5 MCG/ACT inhaler one - two inhalations every 12 hours; gargle and spit after use Patient taking differently: Inhale 2 puffs into the lungs 2 (two) times daily. gargle and spit after use 02/19/15   Burns, Claudina Lick, MD  calcium carbonate (TUMS - DOSED IN MG ELEMENTAL CALCIUM) 500 MG chewable tablet Chew 2 tablets by mouth daily as needed for indigestion or heartburn.    [provider]  carvedilol (COREG) 12.5 MG tablet TAKE 1 TABLET(12.5 MG) BY MOUTH TWICE DAILY WITH A MEAL 05/06/20   Burns, Claudina Lick, MD  cholecalciferol (VITAMIN D3) 25 MCG (1000 UNIT) tablet Take 1 tablet (1,000 Units total) by mouth daily. 03/12/20   [name], MD  COD LIVER OIL PO Take 1 capsule by mouth daily.    [provider]  colchicine 0.6 MG tablet Take 0.5  tablets (0.3 mg total) by mouth daily. 05/06/20   [name], MD  cyanocobalamin (,VITAMIN B-12,) 1000 MCG/ML injection INJECT 1ML INTO MUSCLE EVERY 30 DAYS 03/14/20   [name], MD  diclofenac  sodium (VOLTAREN) 1 % GEL Apply 2 g topically 4 (four) times daily. 03/20/16   Marcial Pacas, MD  EPINEPHrine 0.3 mg/0.3 mL IJ SOAJ injection epinephrine 0.3 mg/0.3 mL injection, auto-injector  INJECT 0.3 MLS INTO MUSCLE ONCE AS NEEDED FOR ANAPHYLAXIS    [provider]  estradiol (ESTRACE) 0.1 MG/GM vaginal cream Place 1 Applicatorful vaginally at bedtime. 03/01/19   Fair, Marin Shutter, MD  famotidine (PEPCID) 20 MG tablet Take 1 tablet (20 mg total) by mouth 2 (two) times daily. 12/14/17   [name], MD  ferrous sulfate 325 (65 FE) MG tablet Take 1 tablet (325 mg total) by mouth every Monday, Wednesday, and Friday. 03/03/19   Fair, Marin Shutter, MD  folic acid (FOLVITE) 094 MCG tablet Take 1 tablet (400 mcg total) by mouth at bedtime. Take 400 mcg by mouth at bedtime. 03/12/20   [name], MD  furosemide (LASIX) 80 MG tablet TAKE 1 TABLET(80 MG) BY MOUTH TWICE DAILY 04/12/19   Fair, Marin Shutter, MD  gabapentin (NEURONTIN) 100 MG capsule Take 1 capsule (100 mg total) by mouth 3 (three) times daily. 11/07/18 02/04/19  [name], MD  hydrocortisone (ANUSOL-HC) 2.5 % rectal cream Place 1 application rectally 2 (two) times daily. 03/01/19   FairMarin Shutter, MD  hydrOXYzine (ATARAX/VISTARIL) 25 MG tablet Take 1 tablet (25 mg total) by mouth 3 (three) times daily as needed for itching. 03/01/19   Fair, Marin Shutter, MD  lactobacillus acidophilus (BACID) TABS tablet Take 2 tablets by mouth 3 (three) times daily. 03/01/19   Fair, Marin Shutter, MD  montelukast (SINGULAIR) 10 MG tablet Take 1 tablet (10 mg total) by mouth at bedtime. 03/12/20   [name], MD  Oxcarbazepine (TRILEPTAL) 300 MG tablet Take 1 tablet (300 mg total) by mouth 2 (two) times daily. 11/07/18   [name], MD  oxyCODONE (OXY IR/ROXICODONE)  5 MG immediate release tablet Take 1 tablet (5 mg total) by mouth every 6 (six) hours as needed (chronic knee and back pain). 04/26/20   [name], MD  permethrin (ELIMITE) 5 % cream permethrin 5 % topical cream  APPLY TWICE DAILY AS DIRECTED    [provider]  polyethylene glycol (MIRALAX / GLYCOLAX) 17 g packet Take 17 g by mouth daily. 03/02/19   Fair, Marin Shutter, MD  potassium chloride SA (KLOR-CON) 20 MEQ tablet Take 1 tablet (20 mEq total) by mouth 3 (three) times daily. 05/06/20   [name], MD  senna-docusate (SENOKOT-S) 8.6-50 MG tablet Take 1 tablet by mouth at bedtime as needed for mild constipation. 03/01/19   Fair, Marin Shutter, MD  silver sulfADIAZINE (SILVADENE) 1 % cream Apply 1 application topically daily. 06/08/18   [name], MD  simvastatin (ZOCOR) 40 MG tablet Take 1 tablet (40 mg total) by mouth at bedtime. 05/06/20   [name], MD  spironolactone (ALDACTONE) 25 MG tablet Take 1 tablet (25 mg total) by mouth daily. 04/09/20   Burns, Claudina Lick, MD  SYRINGE-NEEDLE, DISP, 3 ML (B-D 3CC LUER-LOK SYR 23GX1-1/2) 23G X 1-1/2" 3 ML MISC USE TO INJECT B-12 ONCE MONTHLY 03/28/20   [name], MD  triamcinolone cream (KENALOG) 0.1 %  12/02/19   [provider]  VENTOLIN HFA 108 (90 Base) MCG/ACT inhaler  11/13/19   [provider]    Physical Exam:  Constitutional: Morbidly obese female who appears to be in no acute distress at this time Vitals:   05/08/20  0500 05/08/20 0600 05/08/20 0630 05/08/20 0745  BP: 113/72 (!) 100/55 116/62 121/63  Pulse: 85 90 91 93  Resp: _0 (!) 21  Temp:      TempSrc:      SpO2: 96% 99% 99% 98%   Eyes: PERRL, lids and conjunctivae normal ENMT: Mucous membranes are moist. Posterior pharynx clear of any exudate or lesions.  Neck: normal, supple, no masses, no thyromegaly Respiratory: Decreased overall aeration without significant wheeze heard in the chest.  Currently on 2 L nasal cannula oxygen with O2  saturations maintained and able to talk in complete sentences. Cardiovascular: Regular rate and rhythm, no murmurs / rubs / gallops.  Positive edema noted of the bilateral lower extremities.  Abdomen: no tenderness, no masses palpated. No hepatosplenomegaly. Bowel sounds positive.  Musculoskeletal: no clubbing / cyanosis. No joint deformity upper and lower extremities. Good ROM, no contractures. Normal muscle tone.  Skin: Erythema appreciated of the right and left leg.  There is a healed wound of the distal right shin. Neurologic: CN 2-12 grossly intact. Sensation intact, DTR normal. Strength 5/5 in all 4.  Psychiatric: Normal judgment and insight. Alert and oriented x 3. Normal mood.     Labs on Admission: I have personally reviewed following labs and imaging studies  CBC: Recent Labs  Lab 05/07/20 1754  WBC 4.9  NEUTROABS 3.3  HGB 12.6  HCT 41.3  MCV 98.8  PLT 280   Basic Metabolic Panel: Recent Labs  Lab 05/07/20 1754  NA 138  K 4.3  CL 96*  CO2 34*  GLUCOSE 108*  BUN 29*  CREATININE 1.48*  CALCIUM 9.3   GFR: CrCl cannot be calculated (Unknown ideal weight.). Liver Function Tests: No results for input(s): AST, ALT, ALKPHOS, BILITOT, PROT, ALBUMIN in the last 168 hours. No results for input(s): LIPASE, AMYLASE in the last 168 hours. No results for input(s): AMMONIA in the last 168 hours. Coagulation Profile: No results for input(s): INR, PROTIME in the last 168 hours. Cardiac Enzymes: No results for input(s): CKTOTAL, CKMB, CKMBINDEX, TROPONINI in the last 168 hours. BNP (last 3 results) No results for input(s): PROBNP in the last 8760 hours. HbA1C: No results for input(s): HGBA1C in the last 72 hours. CBG: Recent Labs  Lab 05/07/20 1858  GLUCAP 102*   Lipid Profile: No results for input(s): CHOL, HDL, LDLCALC, TRIG, CHOLHDL, LDLDIRECT in the last 72 hours. Thyroid Function Tests: No results for input(s): TSH, T4TOTAL, FREET4, T3FREE, THYROIDAB in the last  72 hours. Anemia Panel: No results for input(s): VITAMINB12, FOLATE, FERRITIN, TIBC, IRON, RETICCTPCT in the last 72 hours. Urine analysis:    Component Value Date/Time   COLORURINE STRAW (A) 02/15/2019 1856   APPEARANCEUR CLEAR 02/15/2019 1856   LABSPEC 1.006 02/15/2019 1856   PHURINE 8.0 02/15/2019 1856   GLUCOSEU NEGATIVE 02/15/2019 1856   GLUCOSEU NEGATIVE 02/19/2015 1707   HGBUR NEGATIVE 02/15/2019 1856   BILIRUBINUR NEGATIVE 02/15/2019 1856   BILIRUBINUR negative 09/05/2013 0832   KETONESUR NEGATIVE 02/15/2019 1856   PROTEINUR NEGATIVE 02/15/2019 1856   UROBILINOGEN 2.0 (A) 02/19/2015 1707   NITRITE NEGATIVE 02/15/2019 1856   LEUKOCYTESUR NEGATIVE 02/15/2019 1856   Sepsis Labs: Recent Results (from the past 240 hour(s))  Resp Panel by RT-PCR (Flu A&B, Covid) Nasopharyngeal Swab     Status: None   Collection Time: 05/07/20 11:56 PM   Specimen: Nasopharyngeal Swab; Nasopharyngeal(NP) swabs in vial transport medium  Result Value Ref Range Status   SARS Coronavirus 2 by RT  PCR NEGATIVE NEGATIVE Final    Comment: (NOTE) SARS-CoV-2 target nucleic acids are NOT DETECTED.  The SARS-CoV-2 RNA is generally detectable in upper respiratory specimens during the acute phase of infection. The lowest concentration of SARS-CoV-2 viral copies this assay can detect is 138 copies/mL. A negative result does not preclude SARS-Cov-2 infection and should not be used as the sole basis for treatment or other patient management decisions. A negative result may occur with  improper specimen collection/handling, submission of specimen other than nasopharyngeal swab, presence of viral mutation(s) within the areas targeted by this assay, and inadequate number of viral copies(<138 copies/mL). A negative result must be combined with clinical observations, patient history, and epidemiological information. The expected result is Negative.  Fact Sheet for Patients:   EntrepreneurPulse.com.au  Fact Sheet for Healthcare Providers:  IncredibleEmployment.be  This test is no t yet approved or cleared by the [place] FDA and  has been authorized for detection and/or diagnosis of SARS-CoV-2 by FDA under an Emergency Use Authorization (EUA). This EUA will remain  in effect (meaning this test can be used) for the duration of the COVID-19 declaration under Section 564(b)(1) of the Act, 21 U.S.C.section 360bbb-3(b)(1), unless the authorization is terminated  or revoked sooner.       Influenza A by PCR NEGATIVE NEGATIVE Final   Influenza B by PCR NEGATIVE NEGATIVE Final    Comment: (NOTE) The Xpert Xpress SARS-CoV-2/FLU/RSV plus assay is intended as an aid in the diagnosis of influenza from Nasopharyngeal swab specimens and should not be used as a sole basis for treatment. Nasal washings and aspirates are unacceptable for Xpert Xpress SARS-CoV-2/FLU/RSV testing.  Fact Sheet for Patients: EntrepreneurPulse.com.au  Fact Sheet for Healthcare Providers: IncredibleEmployment.be  This test is not yet approved or cleared by the [place] FDA and has been authorized for detection and/or diagnosis of SARS-CoV-2 by FDA under an Emergency Use Authorization (EUA). This EUA will remain in effect (meaning this test can be used) for the duration of the COVID-19 declaration under Section 564(b)(1) of the Act, 21 U.S.C. section 360bbb-3(b)(1), unless the authorization is terminated or revoked.  Performed at [facility], [place] [address] [place], [place] [address]      Radiological Exams on Admission: DG Chest 2 View  Result Date: 05/07/2020 CLINICAL DATA:  Short of breath.  History CHF EXAM: CHEST - 2 VIEW COMPARISON:  02/23/2019 FINDINGS: Heart size within normal limits. Negative for heart failure or edema. No effusion. Mild bibasilar atelectasis. IMPRESSION: Mild bibasilar  atelectasis. Electronically Signed   By: Franchot Gallo M.D.   On: 05/07/2020 19:05   CT Angio Chest PE W and/or Wo Contrast  Result Date: 05/07/2020 CLINICAL DATA:  Shortness of breath EXAM: CT ANGIOGRAPHY CHEST WITH CONTRAST TECHNIQUE: Multidetector CT imaging of the chest was performed using the standard protocol during bolus administration of intravenous contrast. Multiplanar CT image reconstructions and MIPs were obtained to evaluate the vascular anatomy. CONTRAST:  140m OMNIPAQUE IOHEXOL 350 MG/ML SOLN COMPARISON:  Radiograph same day FINDINGS: Cardiovascular: There is slightly suboptimal opacification of the main pulmonary artery however no central or proximal segmental pulmonary embolism is seen. There is prominence of the right main pulmonary artery which could be due to pulmonary arterial hypertension. There is mild cardiomegaly. Coronary artery calcifications are seen. No pericardial effusion or thickening. No evidence right heart strain. There is normal three-vessel brachiocephalic anatomy without proximal stenosis. Scattered aortic atherosclerosis is noted. Mediastinum/Nodes: No hilar, mediastinal, or axillary adenopathy. Thyroid gland, trachea,  and esophagus demonstrate no significant findings. Lungs/Pleura: Minimal ground-glass opacity seen at both lung bases. No pleural effusion or pneumothorax. No airspace consolidation. Upper Abdomen: No acute abnormalities present in the visualized portions of the upper abdomen. A small hiatal hernia is present. Musculoskeletal: No chest wall abnormality. No acute or significant osseous findings. Degenerative changes are seen in midthoracic spine. Review of the MIP images confirms the above findings. IMPRESSION: Slightly suboptimal opacification of the main pulmonary artery however no central or proximal segmental pulmonary embolism Mild cardiomegaly in findings suggestive of pulmonary arterial hypertension Minimal ground-glass opacity at both lung bases which  may be due to atelectasis Small hiatal hernia Aortic Atherosclerosis (ICD10-I70.0). Electronically Signed   By: Prudencio Pair M.D.   On: 05/07/2020 23:59    EKG: Independently reviewed.  Sinus rhythm 86 bpm with abnormal R wave progression  Assessment/Plan DVT  possible cellulitis: Acute.  Patient presents with complaints of worsening bilateral leg swelling and pain.  She has chronic lymphedema and on physical exam patient has erythema of the bilateral lower extremities with increased warmth.  Doppler ultrasound of the lower extremities revealed concern for age-indeterminate deep vein thrombosis in follow-up in the right and left posterior tibial veins.  Patient had initially been started on empiric antibiotics of clindamycin due to concern for cellulitis.  It is not totally clear at this time if patient also has secondary cellulitis. -Admit to medical telemetry bed -Bedrest  -Check ESR  -Continue empiric antibiotics of clindamycin IV  -Lovenox per pharmacy for treatment of VTE  Acute respiratory failure with hypoxia and hypercapnia  history of asthma: Patient noted to be acutely hypoxic down to around 85% on room air and required placement on 2 L of nasal cannula oxygen.  CT angiogram negative for any signs of a pulmonary embolus and noted bilateral atelectasis.  Suspecting symptoms likely multifactorial in nature in setting of morbid obesity and/or obstructive sleep apnea. -Continuous pulse oximetry with nasal cannula oxygen to maintain O2 saturation greater than 92% -Check ABG(ph 7.379, PCO2 61.8, PCO2 112) -Discussed case with Dr. Erskine Emery of PCCM who will try and arrange patient to have an outpatient clinic appointment for need of sleep study -Transitions of care consulted for need of home oxygen  Elevated troponin: Acute.  On admission high-sensitivity troponin initially 12, but repeat 22.  EKG without significant ischemic changes.  Patient was scheduled to have an echocardiogram in  September, but this had not been done. -Recheck troponin to ensure its not still trending up (15) -Check echocardiogram  Diastolic congestive heart failure: Chronic patient reports weight gain of 30 pounds since January.  BNP stable at 60.9 and patient does not appear to be grossly fluid overloaded on imaging.  Reports compliance with Lasix.  Last echocardiogram revealed EF of 65 to 70% with grade 1 diastolic dysfunction.  Patient is on furosemide 80 mg twice daily and can take an extra dose if needed for signs of fluid retention. -Continue current home regimen of furosemide, spironolactone, and Coreg -Dr. Haroldine Laws of heart failure cardiology notified of the patient's admission  Asthma, without acute exacerbation: Patient without any significant signs of wheezing appreciated on physical exam. -Pharmacy substitution of Brovana and budesonide nebs for Symbicort -Albuterol nebs as needed for shortness of breath/wheezing  Essential hypertension -Continue current regimen as tolerated  Diabetes mellitus type 2, controlled: Patient appears to be diet-controlled and initial blood glucose is mildly elevated at 108.  Last hemoglobin A1c was 6.4 on 03/08/2020. -Heart healthy carb modified  diet -Will continue to monitorand consider adding on sliding scale insulin if blood sugars appear to be trending greater than 180.  Chronic kidney disease stage IIIb: Stable.  Creatinine 1.48 which appears around patient's baseline.  She is followed by [facility] in the outpatient setting. -Continue outpatient follow-up with nephrology  Hyperlipidemia -Continue simvastatin 40 mg nightly  Severe morbid obesity suspected obstructive sleep apnea: BMI 65.23 kg/m  GERD -Continue Pepcid daily  DVT prophylaxis: Lovenox Code Status: Full Family Communication: husband updated over the phone  Disposition Plan: Hopefully discharge home once medically stable Consults called: Cardiology Admission status:  Inpatient, require more than 2 midnight stay due to acute respiratory failure with new hypoxia  Norval Morton MD [facility]   If 7PM-7AM, please contact night-coverage   05/08/2020, 8:54 AM

## 2020-05-08 NOTE — Progress Notes (Signed)
Pt arrived to unit via ED stretcher. Pt  Axo x4 oriented to room and unit. Pt instructed to use call bell before attempting to get out of bed. Bed in lowest position wheels locked vss.

## 2020-05-09 ENCOUNTER — Inpatient Hospital Stay (HOSPITAL_COMMUNITY): Payer: Medicare PPO

## 2020-05-09 ENCOUNTER — Other Ambulatory Visit (HOSPITAL_COMMUNITY): Payer: Medicare PPO

## 2020-05-09 ENCOUNTER — Telehealth: Payer: Self-pay | Admitting: Orthopedic Surgery

## 2020-05-09 ENCOUNTER — Telehealth: Payer: Self-pay | Admitting: *Deleted

## 2020-05-09 ENCOUNTER — Ambulatory Visit: Payer: Medicare PPO | Admitting: Orthopedic Surgery

## 2020-05-09 DIAGNOSIS — J9601 Acute respiratory failure with hypoxia: Principal | ICD-10-CM

## 2020-05-09 DIAGNOSIS — I82403 Acute embolism and thrombosis of unspecified deep veins of lower extremity, bilateral: Secondary | ICD-10-CM | POA: Diagnosis not present

## 2020-05-09 LAB — COMPREHENSIVE METABOLIC PANEL
ALT: 16 U/L (ref 0–44)
AST: 17 U/L (ref 15–41)
Albumin: 3 g/dL — ABNORMAL LOW (ref 3.5–5.0)
Alkaline Phosphatase: 73 U/L (ref 38–126)
Anion gap: 5 (ref 5–15)
BUN: 23 mg/dL (ref 8–23)
CO2: 34 mmol/L — ABNORMAL HIGH (ref 22–32)
Calcium: 8.5 mg/dL — ABNORMAL LOW (ref 8.9–10.3)
Chloride: 98 mmol/L (ref 98–111)
Creatinine, Ser: 1.6 mg/dL — ABNORMAL HIGH (ref 0.44–1.00)
GFR, Estimated: 35 mL/min — ABNORMAL LOW (ref >60)
Glucose, Bld: 109 mg/dL — ABNORMAL HIGH (ref 70–99)
Potassium: 3.9 mmol/L (ref 3.5–5.1)
Sodium: 137 mmol/L (ref 135–145)
Total Bilirubin: 0.9 mg/dL (ref 0.3–1.2)
Total Protein: 5.7 g/dL — ABNORMAL LOW (ref 6.5–8.1)

## 2020-05-09 LAB — CBC
HCT: 35.5 % — ABNORMAL LOW (ref 36.0–46.0)
Hemoglobin: 10.8 g/dL — ABNORMAL LOW (ref 12.0–15.0)
MCH: 30.5 pg (ref 26.0–34.0)
MCHC: 30.4 g/dL (ref 30.0–36.0)
MCV: 100.3 fL — ABNORMAL HIGH (ref 80.0–100.0)
Platelets: 148 10*3/uL — ABNORMAL LOW (ref 150–400)
RBC: 3.54 MIL/uL — ABNORMAL LOW (ref 3.87–5.11)
RDW: 14.7 % (ref 11.5–15.5)
WBC: 4.6 10*3/uL (ref 4.0–10.5)
nRBC: 0 % (ref 0.0–0.2)

## 2020-05-09 MED ORDER — FUROSEMIDE 10 MG/ML IJ SOLN
80.0000 mg | Freq: Two times a day (BID) | INTRAMUSCULAR | Status: DC
  Administered 2020-05-09 – 2020-05-13 (×9): 80 mg via INTRAVENOUS
  Filled 2020-05-09 (×9): qty 8

## 2020-05-09 NOTE — Therapy (Signed)
Pt refused to wear cpap unit

## 2020-05-09 NOTE — Telephone Encounter (Signed)
Pt called stating she is still hospitalized and so she wasn't able to make her appt  For a cortisone shot today. However the pt is wanting to know if when Dr. [name] goes to the hospital Friday 05/10/20 can he give her the injection then? She would like a CB with this answer please.   [phone]

## 2020-05-09 NOTE — Progress Notes (Signed)
Patient notified this RN of a bruise that is dark purple/black in color on her right arm near her elbow that is bruised.  Patient does not remember injuring arm at all.  Only hurts in center of bruise where it is a small hard spot.   It is approximately 10 cm in length from elbow to upper arm.  Marked with skin marker. MD notified and aware.   Vira Agar, RN

## 2020-05-09 NOTE — Consult Note (Addendum)
Advanced Heart Failure Team Consult Note   Primary Physician: [name], MD PCP-Cardiologist:  Dr. Haroldine Laws   Reason for Consultation: Acute on Chronic Diastolic Heart Failure   HPI:    April [name] is seen today for evaluation of acute on chronic diastolic heart failure at the request of Dr. Lupita Leash, Internal Medicine.   April [name] is a 70 year old woman with history of morbid obesity Body mass index is 65.23 kg/m., DM2, hypertension, hyperlipidemia, mild-to- moderate coronary artery disease and asthma. She underwent catheterization in February 2008, which showed 50-60% LAD lesion and 30% RCA lesion. EF was 70%. She had mild pulmonary hypertension, primarily due to high output state. Her mean pulmonary artery pressure was 27, wedge pressure was 10. PVR was 1.80 Woods units.Her last echo was in 2017 and showed normal LVEF 65-70%, G1DD, RV normal.   She presented to the [facility] on 4/5 w/ complaints of increasing dyspnea w/ intermittent wheezing and cough +  leg pain. Endorsed 30 lb wt gain since January.    In the ED, she was found to be hypoxic in the 80s, placed on 2L [place] w/ improvement. COVID negative. WBC normal. Hgb 12.6. CXR showed mid bibasilar atelectasis. Normal heart size and no edema or effusion. BNP normal at 60. Hs trop 12>>22>>15.  EKG NSR no ST abnormalities. D-dimer elevated at 1.33. LE venous dopplers + for bilateral DVT but chest CT showed no PE. SCr 1.48 c/w baseline.   She was admitted by [name] for acute hypoxic respiratory failure, with suspected component of acute asthma exacerbation w/ ? CHF contributing, as well as for management of acute bilateral LE DVT w/ ? Superimposed cellulitis.   She is now on Lovenox and empiric abx w/ clindamycin. Home HF meds ordered on admit, including PO Lasix 80 mg bid. Repeat echo ordered, not completed yet. AHF team consulted to assist w/ further management.   Scr slightly higher today, 1.48>>1.60. she is now off supp O2. Feels  comfortable at rest. Still w/ faint wheezing on lung exam. No ischemic like CP.   She is super morbidly obese and sedentary. Requires home aid to help w/ ADLs. Has walker at home but mainly uses wheelchair when away from home. Pulmonary following as outpatient and planning to arrange sleep study.    Echo 2017 LVEF 65-70%, G1DD, RV normal  Repeat Echo pending   Review of Systems: [y] = yes, [ ]  = no   . General: Weight gain [ ] ; Weight loss [ ] ; Anorexia [ ] ; Fatigue [ ] ; Fever [ ] ; Chills [ ] ; Weakness [ ]   . Cardiac: Chest pain/pressure [ ] ; Resting SOB [ Y]; Exertional SOB [Y ]; Orthopnea [ ] ; Pedal Edema [Y ]; Palpitations [ ] ; Syncope [ ] ; Presyncope [ ] ; Paroxysmal nocturnal dyspnea[ ]   . Pulmonary: Cough [ Y]; Wheezing[Y]; Hemoptysis[ ] ; Sputum [ ] ; Snoring [ ]   . GI: Vomiting[ ] ; Dysphagia[ ] ; Melena[ ] ; Hematochezia [ ] ; Heartburn[ ] ; Abdominal pain [ ] ; Constipation [ ] ; Diarrhea [ ] ; BRBPR [ ]   . GU: Hematuria[ ] ; Dysuria [ ] ; Nocturia[ ]   . Vascular: Pain in legs with walking [ ] ; Pain in feet with lying flat [ ] ; Non-healing sores [ ] ; Stroke [ ] ; TIA [ ] ; Slurred speech [ ] ;  . Neuro: Headaches[ ] ; Vertigo[ ] ; Seizures[ ] ; Paresthesias[ ] ;Blurred vision [ ] ; Diplopia [ ] ; Vision changes [ ]   . Ortho/Skin: Arthritis [ ] ; Joint pain [ ] ; Muscle pain [ ] ;  Joint swelling [ ] ; Back Pain [ ] ; Rash [ ]   . Psych: Depression[ ] ; Anxiety[ ]   . Heme: Bleeding problems [ ] ; Clotting disorders [Y ]; Anemia [ ]   . Endocrine: Diabetes [ Y]; Thyroid dysfunction[ ]   Home Medications Prior to Admission medications   Medication Sig Start Date End Date Taking? Authorizing Provider  acetaminophen (TYLENOL) 325 MG tablet Take 2 tablets (650 mg total) by mouth every 6 (six) hours as needed for moderate pain (or Fever >/= 101). 11/16/18  Yes Arrien, Jimmy Picket, MD  allopurinol (ZYLOPRIM) 100 MG tablet Take 1 tablet (100 mg total) by mouth daily. 05/06/20  Yes Burns, Claudina Lick, MD  aspirin (ECOTRIN  LOW STRENGTH) 81 MG EC tablet Take 81 mg by mouth at bedtime.   Yes [provider]  Budesonide (PULMICORT FLEXHALER) 90 MCG/ACT inhaler Inhale 1 puff into the lungs See admin instructions. 1 puff daily and 1 additional puff as needed wheezing or shortness of breath   Yes [provider]  budesonide-formoterol (SYMBICORT) 160-4.5 MCG/ACT inhaler one - two inhalations every 12 hours; gargle and spit after use Patient taking differently: Inhale 2 puffs into the lungs 2 (two) times daily as needed (wheezing). gargle and spit after use 02/19/15  Yes Burns, Claudina Lick, MD  calcium carbonate (TUMS - DOSED IN MG ELEMENTAL CALCIUM) 500 MG chewable tablet Chew 2 tablets by mouth daily as needed for indigestion or heartburn.   Yes [provider]  carvedilol (COREG) 12.5 MG tablet TAKE 1 TABLET(12.5 MG) BY MOUTH TWICE DAILY WITH A MEAL Patient taking differently: Take 12.5 mg by mouth 2 (two) times daily with a meal. 05/06/20  Yes Burns, Claudina Lick, MD  cholecalciferol (VITAMIN D3) 25 MCG (1000 UNIT) tablet Take 1 tablet (1,000 Units total) by mouth daily. 03/12/20  Yes Burns, Claudina Lick, MD  COD LIVER OIL PO Take 1 capsule by mouth daily.   Yes [provider]  colchicine 0.6 MG tablet Take 0.5 tablets (0.3 mg total) by mouth daily. 05/06/20  Yes Burns, Claudina Lick, MD  cyanocobalamin (,VITAMIN B-12,) 1000 MCG/ML injection INJECT 1ML INTO MUSCLE EVERY 30 DAYS Patient taking differently: Inject 1,000 mcg into the muscle every 30 (thirty) days. 03/14/20  Yes Burns, Claudina Lick, MD  diclofenac sodium (VOLTAREN) 1 % GEL Apply 2 g topically 4 (four) times daily. 03/20/16  Yes Marcial Pacas, MD  famotidine (PEPCID) 20 MG tablet Take 1 tablet (20 mg total) by mouth 2 (two) times daily. Patient taking differently: Take 20 mg by mouth daily as needed for heartburn. 12/14/17  Yes [name], MD  ferrous sulfate 325 (65 FE) MG tablet Take 1 tablet (325 mg total) by mouth every Monday, Wednesday, and Friday.  03/03/19  Yes Fair, Marin Shutter, MD  folic acid (FOLVITE) 703 MCG tablet Take 1 tablet (400 mcg total) by mouth at bedtime. Take 400 mcg by mouth at bedtime. 03/12/20  Yes Burns, Claudina Lick, MD  furosemide (LASIX) 80 MG tablet TAKE 1 TABLET(80 MG) BY MOUTH TWICE DAILY Patient taking differently: 80 mg 3 (three) times daily as needed for fluid or edema. 04/12/19  Yes Fair, Marin Shutter, MD  gabapentin (NEURONTIN) 100 MG capsule Take 1 capsule (100 mg total) by mouth 3 (three) times daily. 11/07/18 02/04/19 Yes Burns, Claudina Lick, MD  hydrOXYzine (ATARAX/VISTARIL) 25 MG tablet Take 1 tablet (25 mg total) by mouth 3 (three) times daily as needed for itching. 03/01/19  Yes Fair, Marin Shutter, MD  montelukast (SINGULAIR)  10 MG tablet Take 1 tablet (10 mg total) by mouth at bedtime. 03/12/20  Yes Burns, Claudina Lick, MD  Oxcarbazepine (TRILEPTAL) 300 MG tablet Take 1 tablet (300 mg total) by mouth 2 (two) times daily. 11/07/18  Yes Burns, Claudina Lick, MD  oxyCODONE (OXY IR/ROXICODONE) 5 MG immediate release tablet Take 1 tablet (5 mg total) by mouth every 6 (six) hours as needed (chronic knee and back pain). 04/26/20  Yes Burns, Claudina Lick, MD  polyethylene glycol (MIRALAX / GLYCOLAX) 17 g packet Take 17 g by mouth daily. 03/02/19  Yes Fair, Marin Shutter, MD  potassium chloride SA (KLOR-CON) 20 MEQ tablet Take 1 tablet (20 mEq total) by mouth 3 (three) times daily. 05/06/20  Yes Burns, Claudina Lick, MD  senna-docusate (SENOKOT-S) 8.6-50 MG tablet Take 1 tablet by mouth at bedtime as needed for mild constipation. 03/01/19  Yes Fair, Marin Shutter, MD  simvastatin (ZOCOR) 40 MG tablet Take 1 tablet (40 mg total) by mouth at bedtime. 05/06/20  Yes Burns, Claudina Lick, MD  spironolactone (ALDACTONE) 25 MG tablet Take 1 tablet (25 mg total) by mouth daily. 04/09/20  Yes Burns, Claudina Lick, MD  VENTOLIN HFA 108 (90 Base) MCG/ACT inhaler Inhale 2 puffs into the lungs every 4 (four) hours as needed for wheezing. 11/13/19  Yes [provider]    Past Medical  History: Past Medical History:  Diagnosis Date  . Anemia   . Asthma   . CAD (coronary artery disease)   . Carpal tunnel syndrome   . Cellulitis of both lower extremities 04/11/2015  . CHF (congestive heart failure) ([place])   . Chronic kidney disease    [facility]- followed by [facility]  . Colon polyp, hyperplastic 2007 & 2012  . Complication of anesthesia 1999   svt with renal calculi surgery, no problems since  . CTS (carpal tunnel syndrome)    bilateral  . Diabetes mellitus   . Eczema   . GERD (gastroesophageal reflux disease)   . History of kidney stones 1999  . Hyperlipidemia   . Hypertension   . Leg ulcer ([place]) 04/24/2015   Right lateral leg No evidence of an infection Monitor closely Keep edema controlled   . Leg ulcer ([place])    right lower  . Meralgia paresthetica    Dr. [name]  . Morbid obesity ([place])   . Morbid obesity ([place])   . Neuropathy    toes and legs  . Osteoarthrosis, unspecified whether generalized or localized, lower leg    knee  . PUD (peptic ulcer disease)   . Shortness of breath dyspnea    with exertion  . Sleep apnea    per progress note 02/25/2018  . Type II or unspecified type diabetes mellitus without mention of complication, not stated as uncontrolled   . Unspecified hereditary and idiopathic peripheral neuropathy   . Urticaria   . Vitamin B12 deficiency   . Wears glasses   . Wound cellulitis    right upper leg, healing well    Past Surgical History: Past Surgical History:  Procedure Laterality Date  . ABDOMINAL HYSTERECTOMY    . BREAST BIOPSY Right 07/08/2018  . CARDIAC CATHETERIZATION  2002   non obstructive disease  . colonoscopy with polypectomy  2007 & 2012    hyperplastic ;Dr Watt Climes  . COLONOSCOPY WITH PROPOFOL N/A 06/04/2015   Procedure: COLONOSCOPY WITH PROPOFOL;  Surgeon: Jerene Bears, MD;  Location: [facility];  Service: Gastroenterology;  Laterality: N/A;  . DEBRIDEMENT LEG Right 03/02/2018  WOUND VAC APPLIED  . DEBRIDEMENT LEG  Right 05/27/2018   RIGHT LOWER LEG DEBRIDEMENT,  SKIN GRAFT, VAC PLACEMENT   . DILATION AND CURETTAGE OF UTERUS     multiple  . HEMORRHOID SURGERY    . I & D EXTREMITY Right 03/02/2018   Procedure: RIGHT LEG DEBRIDEMENT AND PLACE VAC;  Surgeon: [name], MD;  Location: [place];  Service: Orthopedics;  Laterality: Right;  . I & D EXTREMITY Right 03/04/2018   Procedure: REPEAT IRRIGATION AND DEBRIDEMENT RIGHT LEG, PLACE WOUND VAC;  Surgeon: [name], MD;  Location: [place];  Service: Orthopedics;  Laterality: Right;  . I & D EXTREMITY Right 03/30/2018   Procedure: IRRIGATION AND DEBRIDEMENT RIGHT LEG, APPLY WOUND VAC;  Surgeon: [name], MD;  Location: [place];  Service: Orthopedics;  Laterality: Right;  . I & D EXTREMITY Right 05/27/2018   Procedure: RIGHT LOWER LEG DEBRIDEMENT,  SKIN GRAFT, VAC PLACEMENT;  Surgeon: [name], MD;  Location: [place];  Service: Orthopedics;  Laterality: Right;  RIGHT LOWER LEG DEBRIDEMENT,  SKIN GRAFT, VAC PLACEMENT  . renal calculi  12/1997   SVT with induction of anesthesia  . right knee arthroscopy    . SKIN SPLIT GRAFT Right 03/04/2018   Procedure: POSSIBLE SPLIT THICKNESS SKIN GRAFT;  Surgeon: [name], MD;  Location: [place];  Service: Orthopedics;  Laterality: Right;  . SKIN SPLIT GRAFT Right 04/01/2018   Procedure: REPEAT IRRIGATION AND DEBRIDEMENT RIGHT LEG, APPLY SPLIT THICKNESS SKIN GRAFT;  Surgeon: [name], MD;  Location: [place];  Service: Orthopedics;  Laterality: Right;  . TONSILLECTOMY AND ADENOIDECTOMY      Family History: Family History  Problem Relation Age of Onset  . Colon cancer Mother   . Prostate cancer Father   . Colon cancer Father   . Diabetes Maternal Aunt   . Breast cancer Maternal Aunt   . Diabetes Maternal Uncle   . Diabetes Paternal Aunt   . Stroke Paternal Aunt        > 65  . Heart disease Paternal Aunt   . Diabetes Paternal Uncle   . Breast cancer Maternal Aunt         X 2  . Breast cancer  Cousin     Social History: Social History   Socioeconomic History  . Marital status: Married    Spouse name: [name]  . Number of children: 1  . Years of education: BS  . Highest education level: Not on file  Occupational History  . Occupation: Disabled    Employer: RETIRED  Tobacco Use  . Smoking status: Never Smoker  . Smokeless tobacco: Never Used  Vaping Use  . Vaping Use: Never used  Substance and Sexual Activity  . Alcohol use: No    Alcohol/week: 0.0 standard drinks  . Drug use: No  . Sexual activity: Not on file  Other Topics Concern  . Not on file  Social History Narrative   Patient lives at home with her husband [name] Patient is retired and has a Conservation officer, nature.    Caffeine - some times.   Right handed.   Social Determinants of Health   Financial Resource Strain: Not on file  Food Insecurity: Not on file  Transportation Needs: Not on file  Physical Activity: Not on file  Stress: Not on file  Social Connections: Not on file    Allergies:  Allergies  Allergen Reactions  . Penicillins Rash and Other (  See Comments)    She was told not to take it anymore. Has patient had a PCN reaction causing immediate rash, facial/tongue/throat swelling, SOB or lightheadedness with hypotension: Yes Has patient had a PCN reaction causing severe rash involving mucus membranes or skin necrosis: No Has patient had a PCN reaction that required hospitalization: No Has patient had a PCN reaction occurring within the last 10 years: No If all of the above answers are "NO", then may proceed with Cephalosporin use.'  . Shellfish Allergy Anaphylaxis  . Sulfonamide Derivatives Anaphylaxis and Other (See Comments)  . Hydrochlorothiazide W-Triamterene Other (See Comments)    Hypokalemia  . Lotensin [Benazepril Hcl] Hives  . Dipyridamole Other (See Comments)    Unknown reaction  . Estrogens Other (See Comments)    Unknown reaction  . Hydrochlorothiazide Other (See  Comments)    Unknown reaction  . Latex Other (See Comments)    Unknown reaction - stated previously during surgery gloves had to be changed, but unsure if it is still an allergy or not.   . Metronidazole Other (See Comments)    Unknown reaction  . Other Itching    PICKLES  . Spironolactone Other (See Comments)    UNSPECIFIED > "kidney problems"  . Sulfa Antibiotics Other (See Comments)    Unknown reaction  . Torsemide Other (See Comments)    Unknown reaction  . Valsartan Other (See Comments)    Unknown reaction  . Madelaine Bhat Isothiocyanate] Itching    Objective:    Vital Signs:   Temp:  [98.4 F (36.9 C)-99.8 F (37.7 C)] 99.8 F (37.7 C) (04/07 1015) Pulse Rate:  [79-97] 79 (04/07 1015) Resp:  [13-28] 18 (04/07 1015) BP: (96-142)/(42-95) 116/56 (04/07 1015) SpO2:  [92 %-100 %] 94 % (04/07 1015) FiO2 (%):  [28 %] 28 % (04/06 2201) Weight:  [172.4 kg] 172.4 kg (04/06 1227) Last BM Date: 05/08/20  Weight change: Filed Weights   05/08/20 1227  Weight: (!) 172.4 kg    Intake/Output:   Intake/Output Summary (Last 24 hours) at 05/09/2020 1055 Last data filed at 05/09/2020 1023 Gross per 24 hour  Intake 243 ml  Output 1250 ml  Net -1007 ml      Physical Exam    General: supper morbidly obese, chronically ill appearing AAF. No resp difficulty HEENT: normal Neck: supple. Thick neck, JVD elevated . Carotids 2+ bilat; no bruits. No lymphadenopathy or thyromegaly appreciated. Cor:  RRR, Distant heart sounds due to body habitus cannot exclude MRG Lungs: distant BS bilaterally w/ faint wheezing noted L>R  Abdomen: obese, soft, nontender, nondistended. No hepatosplenomegaly. No bruits or masses. Good bowel sounds. Extremities: no cyanosis, clubbing, or rash. obese extremities w/ chronic venous stasis dermatitis LEs  Neuro: alert & orientedx3, cranial nerves grossly intact. moves all 4 extremities w/o difficulty. Affect pleasant   Telemetry   NSR 70s   EKG    NSR  no ST abnormalities   Labs   Basic Metabolic Panel: Recent Labs  Lab 05/07/20 1754 05/08/20 0937 05/09/20 0434  NA 138 139 137  K 4.3 4.1 3.9  CL 96*  --  98  CO2 34*  --  34*  GLUCOSE 108*  --  109*  BUN 29*  --  23  CREATININE 1.48*  --  1.60*  CALCIUM 9.3  --  8.5*    Liver Function Tests: Recent Labs  Lab 05/09/20 0434  AST 17  ALT 16  ALKPHOS 73  BILITOT 0.9  PROT 5.7*  ALBUMIN 3.0*   No results for input(s): LIPASE, AMYLASE in the last 168 hours. No results for input(s): AMMONIA in the last 168 hours.  CBC: Recent Labs  Lab 05/07/20 1754 05/08/20 0937 05/09/20 0434  WBC 4.9  --  4.6  NEUTROABS 3.3  --   --   HGB 12.6 12.6 10.8*  HCT 41.3 37.0 35.5*  MCV 98.8  --  100.3*  PLT 155  --  148*    Cardiac Enzymes: No results for input(s): CKTOTAL, CKMB, CKMBINDEX, TROPONINI in the last 168 hours.  BNP: BNP (last 3 results) Recent Labs    10/20/19 1226 05/07/20 1754  BNP 51.9 60.9    ProBNP (last 3 results) No results for input(s): PROBNP in the last 8760 hours.   CBG: Recent Labs  Lab 05/07/20 1858 05/08/20 2151  GLUCAP 102* 107*    Coagulation Studies: No results for input(s): LABPROT, INR in the last 72 hours.   Imaging   VAS [place] LOWER EXTREMITY VENOUS (DVT) (ONLY [facility] & [facility])  Result Date: 05/08/2020  Lower Venous DVT Study Indications: Pain, Swelling, and SOB.  Limitations: Body habitus, patient pain tolerance, and limited patient mobility. Comparison Study: 11-13-2018 Bilateral lower extremity venous was limited, but                   negative for DVT. Performing Technologist: Darlin Coco RDMS,RVT  Examination Guidelines: A complete evaluation includes B-mode imaging, spectral Doppler, color Doppler, and power Doppler as needed of all accessible portions of each vessel. Bilateral testing is considered an integral part of a complete examination. Limited examinations for reoccurring indications may be performed as noted. The reflux portion  of the exam is performed with the patient in reverse Trendelenburg.  +---------+---------------+---------+-----------+----------+-------------------+ RIGHT    CompressibilityPhasicitySpontaneityPropertiesThrombus Aging      +---------+---------------+---------+-----------+----------+-------------------+ CFV      Full           Yes      Yes                                      +---------+---------------+---------+-----------+----------+-------------------+ SFJ      Full                                                             +---------+---------------+---------+-----------+----------+-------------------+ FV Prox  Full                                                             +---------+---------------+---------+-----------+----------+-------------------+ FV Mid   Full           Yes      Yes                                      +---------+---------------+---------+-----------+----------+-------------------+ FV Distal  Unable to visualize                                                       due to pain                                                               tolerance/tissue                                                          properties          +---------+---------------+---------+-----------+----------+-------------------+ PFV      Full                                                             +---------+---------------+---------+-----------+----------+-------------------+ POP                                                   Unable to visualize                                                       due to limited                                                            patient mobility    +---------+---------------+---------+-----------+----------+-------------------+ PTV      None           No       No                   Age Indeterminate    +---------+---------------+---------+-----------+----------+-------------------+ PERO                                                  Not well visualized +---------+---------------+---------+-----------+----------+-------------------+   +---------+---------------+---------+-----------+----------+-----------------+ LEFT     CompressibilityPhasicitySpontaneityPropertiesThrombus Aging    +---------+---------------+---------+-----------+----------+-----------------+ CFV      Full           Yes      Yes                                    +---------+---------------+---------+-----------+----------+-----------------+  SFJ      Full                                                           +---------+---------------+---------+-----------+----------+-----------------+ FV Prox  Full                                                           +---------+---------------+---------+-----------+----------+-----------------+ FV Mid   Full           Yes      Yes                                    +---------+---------------+---------+-----------+----------+-----------------+ FV Distal               Yes      Yes                                    +---------+---------------+---------+-----------+----------+-----------------+ PFV      Full                                                           +---------+---------------+---------+-----------+----------+-----------------+ POP      Full           Yes      Yes                                    +---------+---------------+---------+-----------+----------+-----------------+ PTV      Partial        Yes      Yes                  Age Indeterminate +---------+---------------+---------+-----------+----------+-----------------+ PERO                    Yes      Yes                                    +---------+---------------+---------+-----------+----------+-----------------+     Summary: RIGHT: - Findings consistent  with age indeterminate deep vein thrombosis involving the right posterior tibial veins. - No cystic structure found in the popliteal fossa.  LEFT: - Findings consistent with age indeterminate deep vein thrombosis involving the left posterior tibial veins. - No cystic structure found in the popliteal fossa.  *See table(s) above for measurements and observations. Electronically signed by Harold Barban MD on 05/08/2020 at 9:59:32 PM.    Final       Medications:     Current Medications: . allopurinol  100 mg Oral Daily  . arformoterol  15 mcg Nebulization BID  . budesonide (PULMICORT) nebulizer solution  0.5 mg Nebulization BID  . carvedilol  12.5  mg Oral BID WC  . cyanocobalamin  1,000 mcg Intramuscular Q30 days  . enoxaparin (LOVENOX) injection  1 mg/kg Subcutaneous Q12H  . famotidine  20 mg Oral Daily  . ferrous sulfate  325 mg Oral Q M,W,F  . folic acid  528 mcg Oral QHS  . furosemide  80 mg Oral BID  . gabapentin  100 mg Oral TID  . montelukast  10 mg Oral QHS  . Oxcarbazepine  300 mg Oral BID  . potassium chloride  20 mEq Oral Daily  . saccharomyces boulardii  250 mg Oral BID  . simvastatin  40 mg Oral QHS  . sodium chloride flush  3 mL Intravenous Q12H  . spironolactone  25 mg Oral Daily     Infusions: . clindamycin (CLEOCIN) IV 600 mg (05/09/20 0245)      Assessment/Plan   1. Acute Hypoxic Respiratory Failure - O2 sats initially in the 80s in ED, improved w/ Supp O2 - Admit CXR unremarkable. No edema, effusions or infiltrates - Chest CT negative for PE - BNP normal at 60 but likely falsely low due to obesity. Volume overloaded on exam c/w CHF - multifactorial 2/2 acute asthma exacerbation + OHS + a/c dCHF   2. Bilateral LE DVT  - confirmed by venous dopplers. Chest CT negative for PE - on Lovenox, will need transition to oral anticoagulant   3. Acute on Chronic Diastolic Heart Failure - RHC 2008 c/w mild pulmonary hypertension, primarily due to high output state.  Her mean pulmonary artery pressure was 27, wedge pressure was 10. PVR was 1.80 Woods units. - Echo 2017 LVEF 65-70%, G1DD, RV normal - Repeat Echo pending - Fluid overloaded on exam. Switch to IV Lasix 80 mg bid  - Suspect underlying OSA/OHS. Needs sleep study  - continue spiro 25 mg daily  - no SGLT2i given risk of GU infection given morbid obesity/ large panus   4. Acute Asthma Exacerbation  - management per triad  - respiratory status improving w/ treatment   5. ? LE Cellulitis  - AF. WBC normal - abx per triad   6. CAD:  - LHC in 2008 showed 50-60% LAD lesion and 30% RCA lesion - no ischemic like CP. EKG shows NSR no ST abnormality. Hs trop low level and flat, not c/w ACS - c/w  blocker and statin - should be on ASA as long as no contraindication   7. Stage III CKD - Baseline SCr ~1.5 - 1.6 today, monitor w/ diuresis    Length of Stay: 1  Brittainy Simmons, PA-C  05/09/2020, 10:55 AM  Advanced Heart Failure Team Pager [phone] (M-F; 7a - 5p)  Please contact [facility] for night-coverage after hours (4p -7a ) and weekends on [email or web address]  Patient seen with PA, agree with the above note.    She was admitted with shortness of breath, hypoxemia, lower leg swelling and redness.    Found to have probable bilateral lower leg DVTs, no evidence for PE on CT chest.  She is on 2 L [place]. Treating for LE cellulitis with clindamycin.   General: NAD, obese Neck: JVP 14-16 cm, no thyromegaly or thyroid nodule.  Lungs: Decreased at bases. CV: Nondisplaced PMI.  Heart regular S1/S2, no S3/S4, no murmur.  1+ edema to thighs, suspect component of lymphedema as well.  No carotid bruit.  Unable to palpate pedal pulses.  Abdomen: Soft, nontender, no hepatosplenomegaly, no distention.  Skin: Intact without lesions or rashes.  Neurologic: Alert and oriented x  3.  Psych: Normal affect. Extremities: No clubbing or cyanosis.  HEENT: Normal.   Acute hypoxemic respiratory failure: Suspect  this is multifactorial, probably has underlying OHS/OSA, and now with superimposed volume overload/diastolic CHF.  She is clearly volume overloaded on exam.  - Lasix 80 mg IV bid and follow response.  - May need home oxygen, will need outpatient sleep study.  - She needs echo, will arrange.   Bilateral LE DVTs: No PE on CTA.  She is on Lovenox.  Will need transition to [place] eventually.   Possible cellulitis being treated with clindamycin.   CKD 3, creatinine so far at baseline.   Loralie Champagne 05/09/2020 12:23 PM

## 2020-05-09 NOTE — Progress Notes (Signed)
Patient states dark purple area on right upper arm is getting bigger.  Area extends outside the marked area by half inch, area is warm to touch. Physican notified. Ultrasound ordered to check for abcess.  Patient made aware.

## 2020-05-09 NOTE — Care Management (Addendum)
CM acknowledges MD order for  benefits check request for copay for Xarelto and Eliquis, request has been submitted to CMA's.  . CM will arrange for Home oxygen as ordered. TOC Team will continue to monitor.  Ricki Miller, RN BSN Case Manager

## 2020-05-09 NOTE — TOC Benefit Eligibility Note (Signed)
Transition of Care [facility]) Benefit Eligibility Note    Patient Details  Name: April [name] MRN: [id number] Date of Birth: 11/27/1950   Medication/Dose: Alveda Reasons 15 MG BID : CO-PAY $40.00   and  XARELTO  20 MG DAILY :CO-PAY- $40.00   ELIQUIS  2.5 MG BID -CO-PAY $40.00  and  ELIQUIS  5 MG BID_CO-PAY- $40.00  Covered?: Yes  Tier: 2 Drug  Prescription Coverage Preferred Pharmacy: [facility] [address]  Spoke with Person/Company/Phone Number:: DEATRA   @ [employer]  #  [phone]  Co-Pay: $40.00  Prior Approval: No  Deductible:  (NO DEDUCTIBLE WITH PLAN)  Additional Notes: APIXABAN  and  ELIQUIS  10 MG - NON-FORMULARY    Memory Argue Phone Number: 05/09/2020, 11:57 AM

## 2020-05-09 NOTE — Evaluation (Signed)
Occupational Therapy Evaluation Patient Details Name: April [name] MRN: [id number] DOB: February 10, 1950 Today's Date: 05/09/2020    History of Present Illness Patient is a 70 year old female history of HTN, HLD, CAD, CHF, asthma, CKD stage IIIb, T2DM, anemia, morbid obesity, cellulitis presented with shortness of breath, leg pain with progressively worsening shortness of breath x 10 days. Patient diagnosed with bilateral is indeterminate DVT of  tibial veins, with possible superimposed cellulitis and acute hypoxic respiratory failure   Clinical Impression   Patient lives with her spouse and son in a two story house, patient stays on main level. Patient has ramp entry and put her bariatric hospital bed in the den as she cannot get upstairs. Patient has a half bath downstairs therefore sponge bathes with spouse assist. Patient states during the day when family is at work she primarily takes few steps from recliner to bedside commode, will only try walking with walker when family is home. Spouse assists as needed with dressing as well. Currently patient is limited by pain in bilateral legs and shoulders, needing set up to min A for UB ADLs, max to total assist for LB ADLs and min A with walker to side step to head of bed. Patient needed cues for hand placement, bed height elevated and min A for safety to take ~5 steps to head of bed. Safety concerns with any mobility beyond side of bed, would recommend +2 to progress due to patient's pain and limited activity tolerance. Patient would benefit from [place] rehab prior to D/C home in order to maximize functional mobility, strength, endurance, balance to safely D/C home with family support.  Patient on 2L upon arrival, attempted side stepping edge of bed on room air patient desat to 83% therefore 2L donned. Notified RN.     Follow Up Recommendations  SNF    Equipment Recommendations  None recommended by OT       Precautions / Restrictions  Precautions Precautions: Fall Precaution Comments: denies any falls, monitor O2 Restrictions Weight Bearing Restrictions: No      Mobility Bed Mobility Overal bed mobility: Needs Assistance Bed Mobility: Supine to Sit;Sit to Supine     Supine to sit: Min assist;HOB elevated Sit to supine: Mod assist   General bed mobility comments: min A to assist R LE to EOB, mod A to lift LEs back onto bed. patient does assist with UEs in boost mode in bed    Transfers Overall transfer level: Needs assistance Equipment used: Rolling walker (2 wheeled) Transfers: Sit to/from Stand Sit to Stand: Min assist;From elevated surface         General transfer comment: would recommend +2 assist for further mobility due to patient's decreased activity tolerance. with increased time patient was able to power up to standing with min A and take few side steps leaning heavily onto walker    Balance Overall balance assessment: Needs assistance Sitting-balance support: Feet supported Sitting balance-Leahy Scale: Fair     Standing balance support: Bilateral upper extremity supported Standing balance-Leahy Scale: Poor Standing balance comment: reliant on UE support                           ADL either performed or assessed with clinical judgement   ADL Overall ADL's : Needs assistance/impaired Eating/Feeding: Set up;Bed level   Grooming: Set up;Sitting;Bed level   Upper Body Bathing: Minimal assistance;Sitting   Lower Body Bathing: Maximal assistance;Sitting/lateral leans;Sit to/from stand Lower Body Bathing  Details (indicate cue type and reason): patient reports spouse assists with sponge bath at baseline Upper Body Dressing : Minimal assistance;Sitting   Lower Body Dressing: Total assistance;Sitting/lateral leans;Sit to/from stand Lower Body Dressing Details (indicate cue type and reason): patient reports she tries to dress herself at baseline, spouse assists as needed. patient  needing total A to don socks and having significant LE pain with mobility Toilet Transfer: Minimal assistance;RW Toilet Transfer Details (indicate cue type and reason): with increased time, cues for hand placement and bed height elevated patient able to take ~5 side steps to head of bed with walker. needs increased time due to LE pain and min A for safety Toileting- Clothing Manipulation and Hygiene: Total assistance;Bed level       Functional mobility during ADLs: Minimal assistance;Rolling walker;Cueing for safety;Cueing for sequencing;+2 for safety/equipment General ADL Comments: patient requiring increased assistance for self care and mobility due to LE pain, limiting patient activity tolerance, balance, safety                  Pertinent Vitals/Pain Pain Assessment: Faces Faces Pain Scale: Hurts whole lot Pain Location: bilateral legs, bilateral shoulders Pain Descriptors / Indicators: Aching;Sore;Moaning Pain Intervention(s): Monitored during session     Hand Dominance Right   Extremity/Trunk Assessment Upper Extremity Assessment Upper Extremity Assessment: Generalized weakness   Lower Extremity Assessment Lower Extremity Assessment: Defer to PT evaluation   Cervical / Trunk Assessment Cervical / Trunk Assessment: Other exceptions Cervical / Trunk Exceptions: body habitus   Communication Communication Communication: No difficulties   Cognition Arousal/Alertness: Awake/alert Behavior During Therapy: WFL for tasks assessed/performed Overall Cognitive Status: Within Functional Limits for tasks assessed                                     General Comments  patient desat to 83% on room air post side stepping            Home Living Family/patient expects to be discharged to:: Private residence Living Arrangements: Spouse/significant other;Children (son) Available Help at Discharge: Family;Available PRN/intermittently (family works) Type of Home:  House Home Access: [place]: Two level;Able to live on main level with bedroom/bathroom         Bathroom Toilet: Standard     Home Equipment: Bedside commode;Other (comment);Wheelchair - manual;Cane - single point;Walker - 2 wheels;Walker - 4 wheels (put bedside commode over toilet. also has potty chair in [employer])   Additional Comments: has bariatric bed in [employer]      Prior Functioning/Environment Level of Independence: Needs assistance  Gait / Transfers Assistance Needed: when family isn't home patient stays in the den and takes 2-3 steps from recliner to bedside commode. when family is home with try and walk more with walker. ADL's / Homemaking Assistance Needed: spouse assists with sponge bathing, getting legs in/out of bed, spouse will assist with LB dressing as needed. patient can get herself to bedside commode during the day.   Comments: no home O2        OT Problem List: Decreased strength;Decreased activity tolerance;Impaired balance (sitting and/or standing);Decreased safety awareness;Pain;Obesity      OT Treatment/Interventions: Self-care/ADL training;Therapeutic activities;Patient/family education;Balance training;Therapeutic exercise    OT Goals(Current goals can be found in the care plan section) Acute Rehab OT Goals Patient Stated Goal: "I want to do better" OT Goal Formulation: With patient Time For Goal Achievement:  05/23/20 Potential to Achieve Goals: Good  OT Frequency: Min 2X/week    AM-PAC OT "6 Clicks" Daily Activity     Outcome Measure Help from another person eating meals?: A Little Help from another person taking care of personal grooming?: A Little Help from another person toileting, which includes using toliet, bedpan, or urinal?: A Lot Help from another person bathing (including washing, rinsing, drying)?: A Lot Help from another person to put on and taking off regular upper body clothing?: A Little Help from  another person to put on and taking off regular lower body clothing?: Total 6 Click Score: 14   End of Session Equipment Utilized During Treatment: Oxygen;Rolling walker Nurse Communication: Mobility status;Other (comment) (O2 on room air)  Activity Tolerance: Patient limited by pain Patient left: in bed;with call bell/phone within reach  OT Visit Diagnosis: Unsteadiness on feet (R26.81);Other abnormalities of gait and mobility (R26.89);Muscle weakness (generalized) (M62.81);Pain Pain - part of body: Leg (bilateral)                Time: 8546-2703 OT Time Calculation (min): 31 min Charges:  OT General Charges $OT Visit: 1 Visit OT Evaluation $OT Eval Moderate Complexity: 1 Mod OT Treatments $Self Care/Home Management : 8-22 mins  Delbert Phenix OT OT pager: [place] 05/09/2020, 2:45 PM

## 2020-05-09 NOTE — Telephone Encounter (Signed)
-----   Message from Candee Furbish, MD sent at 05/08/2020  4:51 PM EDT ----- Regarding: Sleep or Pulm referral, next available provider Thanks! Linna Hoff

## 2020-05-09 NOTE — TOC Initial Note (Signed)
Transition of Care [facility]) - Initial/Assessment Note    Patient Details  Name: April [name] MRN: [id number] Date of Birth: [birth year]  Transition of Care [facility]) CM/SW Contact:    Ninfa Meeker, RN Phone Number: 05/09/2020, 2:09 PM  Clinical Narrative:    Case manager spoke with patient concerning co-pay of $40.00 for Xarelto or Eliquis. Informed patient that MD has not determined which med will be ordered at discharge yet. Patient states co-pay is not a problem, she has to pay for what she needs. Patient also has questions concerning what she can eat with new anticoagulant, CM spoke with MD and he will address with patient. TOC Team will continue to monitor.         Patient Goals and CMS Choice        Expected Discharge Plan and Services                                                Prior Living Arrangements/Services                       Activities of Daily Living      Permission Sought/Granted                  Emotional Assessment              Admission diagnosis:  Shortness of breath [R06.02] SOB (shortness of breath) [R06.02] Cellulitis, leg [L03.119] Hypoxia [R09.02] Cellulitis, unspecified cellulitis site [L03.90] Patient Active Problem List   Diagnosis Date Noted  . Cellulitis, leg 05/08/2020  . Leg DVT (deep venous thromboembolism), acute, bilateral ([place]) 05/08/2020  . Acute respiratory failure with hypoxia and hypercapnia ([place]) 05/08/2020  . SOB (shortness of breath)   . Acute idiopathic gout of right foot   . Morbid obesity ([place])   . Idiopathic chronic gout of multiple sites without tophus   . Cellulitis 02/02/2019  . Pneumonia due to COVID-19 virus 12/10/2018  . COVID-19 virus infection 12/06/2018  . Right hip pain 11/09/2018  . Osteomyelitis of ankle or foot, acute, left ([place]) 09/07/2018  . Osteomyelitis of ankle or foot, left, acute ([place]) 09/07/2018  . Onychomycosis 05/19/2018  . Idiopathic chronic venous  hypertension of right lower extremity with ulcer and inflammation ([place]) 05/19/2018  . Skin lumps 01/14/2018  . Bilateral leg cramps 12/14/2017  . Left shoulder pain 08/14/2016  . Meralgia paresthetica 07/16/2016  . Chronic left-sided low back pain with left-sided sciatica 06/02/2016  . Whole body pain 03/20/2016  . Osteopenia 01/11/2016  . CKD (chronic kidney disease) stage 3, GFR 30-59 ml/min ([facility]) 01/02/2016  . Hand paresthesia 07/18/2015  . Carpal tunnel syndrome 06/07/2015  . Family history of colon cancer   . Diabetes mellitus with neurological manifestations ([place]) 04/18/2015  . Bilateral leg edema 04/11/2015  . Abnormality of gait 01/03/2015  . Hereditary and idiopathic peripheral neuropathy 01/03/2015  . GERD (gastroesophageal reflux disease) 06/17/2014  . Hx of colonic polyps 12/15/2012  . Vitamin B12 deficiency 11/03/2012  . Intrinsic asthma 03/23/2012  . Chronic diastolic heart failure ([place]) 02/20/2011  . OSA (obstructive sleep apnea) 09/17/2010  . URINARY URGENCY 01/08/2010  . CAD, NATIVE VESSEL 11/20/2008  . Osteoarthritis of knees, bilateral 06/14/2008  . Hyperlipidemia 05/10/2007  . Essential hypertension 01/18/2007  . Hypokalemia 04/30/2006  . HX, PERSONAL, PEPTIC ULCER DISEASE 04/30/2006  PCP:  [name], MD Pharmacy:   [facility] [place], [place] - [address] [place] [address] Phone: [phone] Fax: [phone]     Social Determinants of Health (SDOH) Interventions    Readmission Risk Interventions No flowsheet data found.

## 2020-05-09 NOTE — Progress Notes (Signed)
PROGRESS NOTE    April [name]  KZS:[id number] DOB: [birth year] DOA: 05/07/2020 PCP: [name], MD   Chief Complaint  Patient presents with  . Shortness of Breath  Brief Narrative: 70 year old female with history of HTN, HLD, CAD, CHF, asthma, CKD stage IIIb, T2DM, anemia, morbid obesity, history of cellulitis presented with shortness of breath, leg pain with progressively worsening shortness of breath, difficulty 10 days, walk to the bathroom.  Was having intermittent wheezing and mild productive cough with yellow sputum x1 week.  Baseline echocardiogram.,  But mostly sedentary, has gained 30 pound weight since January. In the ED, patient was seen to be afebrile, blood pressure 100/55-140/72, and O2 saturations noted to be as low as 85% currently maintained on 2 L of oxygen.Labs significant for chloride 96, CO2 34, BUN 29, and creatinine 1.48.Angiogram of the chest with contrast was obtained showing no clear signs of PE,mild cardiomegaly, pulmonary artery hypertension, small hiatal hernia, and likely atelectasis.Influenza and COVID-19 screening were negative.  Patient had been started on clindamycin IV.DDU:KGU5 69 pH 7.3, PO2 112. Patient was also found to have DVT.  Subjective: Seen and examined this morning Complains of some pain on the right lower extremity with CHF ischemic attack the right lateral side, redness edema is improving. Overnight afebrile. Creatinine uptrending 1.4>1.6. HB SLIGHTLY LOW 10.8.  Assessment & Plan:  Bilateral is indeterminate DVT of  Tibial veins,  with possible superimposed cellulitis Chronic lymphedema: Patient w/ worsening bilateral leg swelling and pain, has chronic lymphedema and on exam suspected to have cellulitis.  Doppler ultrasound showed age-indeterminate DVT.  Patient is placed on empiric clindamycin for possible secondary cellulitis.  Continue anticoagulation with Lovenox.  ESR is normal at 13 less likely infection, no leukocytosis.  Continue  diuresis.  Acute hypoxic respiratory failure suspect component of CHF along chronic component with secondary pulmonary hypertension in the setting of OSA/OHS with morbid obesity.  CT angio did not show acute PE, minimal groundglass opacity in both lung bases due to atelectasis, slight left suboptimal opacification of the main pulmonary artery however no central or proximal segmental PE.  PCCM Dr. Erskine Emery has reviewed the case and plans for outpatient follow-up sleep apnea evaluation.  Will need to continue with oxygen.    Asthma without exacerbation continue Pulmicort, nebs.  Acute on Chronice diastolic congestive heart failure although normal BNP 60.9 in the setting of obesity.  Grossly fluid overloaded on imaging, recent weight gain of 30 pounds, last echocardiogram  Normal lvef, +with grade 1 DD.  Normally on Lasix 80 mg twice daily.  Continue the same continue Aldactone, Coreg, Dr. Haroldine Laws from [facility] currently notified of patient's admission.  Patient requesting to see CHF team.  I have requested cardiology to follow along-will benefit with IV Lasix.  Repeat echo is pending Net IO Since Admission: -1,250 mL [05/09/20 0747]  Filed Weights   05/08/20 1227  Weight: (!) 172.4 kg   Mildly elevated troponin suspect demand ischemia due to hypoxia. Trop 22> 15- normalized.  HTN: Blood pressures controlled, continue Coreg 12.5 mg twice daily and Aldactone 25 mg daily  HLD: Cont simvastatin 40 mg  GERD continue PPI  CKD stage IIIb: Renal function is stable.  Monitor closely. Recent Labs    03/08/20 1347 05/07/20 1754 05/09/20 0434  BUN 34* 29* 23  CREATININE 1.70* 1.48* 1.60*   T2DM, last A1c 6.4.  Continue diet modification tablets Recent Labs  Lab 05/07/20 1858 05/08/20 2151  GLUCAP 102* 107*   Chronic anemia:  In the setting of chronic disease overall stable.  Monitor Recent Labs  Lab 05/07/20 1754 05/08/20 0937 05/09/20 0434  HGB 12.6 12.6 10.8*  HCT 41.3 37.0 35.5*    Morbid obesity BMI 65 , ABG suggests OSA/versus with secondary pulmonary hypertension Dr. Erskine Emery is planning to have expedited sleep study and outpatient follow-up.  Will benefit with outpatient follow-up and weight loss.   Diet Order            Diet heart healthy/carb modified Room service appropriate? Yes; Fluid consistency: Thin  Diet effective now                Patient's Body mass index is 65.23 kg/m. DVT prophylaxis: lovenox Code Status:   Code Status: Full Code  Family Communication: plan of care discussed with patient at bedside.  Status is: Inpatient Remains inpatient appropriate because:Inpatient level of care appropriate due to severity of illness  Dispo: The patient is from: Home              Anticipated d/c is to: TBD- [facility] SN[facility].Obtain PT OT evaluation for disposition anticipated skilled nursing facility              Patient currently is not medically stable to d/c.   Difficult to place patient No   Unresulted Labs (From admission, onward)          Start     Ordered   05/09/20 0500  CBC  Every 72 hours,   R      05/08/20 1233   05/08/20 0904  Blood gas, arterial  Once,   R        05/08/20 0903          Medications reviewed:  Scheduled Meds: . allopurinol  100 mg Oral Daily  . arformoterol  15 mcg Nebulization BID  . budesonide (PULMICORT) nebulizer solution  0.5 mg Nebulization BID  . carvedilol  12.5 mg Oral BID WC  . cyanocobalamin  1,000 mcg Intramuscular Q30 days  . enoxaparin (LOVENOX) injection  1 mg/kg Subcutaneous Q12H  . famotidine  20 mg Oral Daily  . ferrous sulfate  325 mg Oral Q M,W,F  . folic acid  702 mcg Oral QHS  . furosemide  80 mg Oral BID  . gabapentin  100 mg Oral TID  . montelukast  10 mg Oral QHS  . Oxcarbazepine  300 mg Oral BID  . potassium chloride  20 mEq Oral Daily  . saccharomyces boulardii  250 mg Oral BID  . simvastatin  40 mg Oral QHS  . sodium chloride flush  3 mL Intravenous Q12H  . spironolactone  25 mg  Oral Daily   Continuous Infusions: . clindamycin (CLEOCIN) IV 600 mg (05/09/20 0245)    Consultants:see note  Procedures:see note  Antimicrobials: Anti-infectives (From admission, onward)   Start     Dose/Rate Route Frequency Ordered Stop   05/08/20 1100  clindamycin (CLEOCIN) IVPB 600 mg        600 mg 100 mL/hr over 30 Minutes Intravenous Every 8 hours 05/08/20 0913     05/08/20 0215  clindamycin (CLEOCIN) IVPB 600 mg        600 mg 100 mL/hr over 30 Minutes Intravenous  Once 05/08/20 0211 05/08/20 0407     Culture/Microbiology    Component Value Date/Time   SDES URINE, RANDOM 02/15/2019 1815   SPECREQUEST  02/15/2019 1815    NONE Performed at [facility], [place] [address], [place], [place] [address]  CULT (A) 02/15/2019 1815    80,000 COLONIES/mL CITROBACTER FREUNDII 80,000 COLONIES/mL PSEUDOMONAS AERUGINOSA    REPTSTATUS 02/17/2019 FINAL 02/15/2019 1815    Other culture-see note  Objective: Vitals: Today's Vitals   05/08/20 2108 05/08/20 2121 05/08/20 2201 05/09/20 0515  BP:  96/77  110/63  Pulse:  85  83  Resp:  20  20  Temp:  98.4 F (36.9 C)  98.4 F (36.9 C)  TempSrc:      SpO2:  96% 95% 96%  Weight:      Height:      PainSc: 0-No pain       Intake/Output Summary (Last 24 hours) at 05/09/2020 0735 Last data filed at 05/09/2020 0500 Gross per 24 hour  Intake --  Output 1250 ml  Net -1250 ml   Filed Weights   05/08/20 1227  Weight: (!) 172.4 kg   Weight change:   Intake/Output from previous day: 04/06 0701 - 04/07 0700 In: -  Out: 1250 [Urine:1250] Intake/Output this shift: No intake/output data recorded. Filed Weights   05/08/20 1227  Weight: (!) 172.4 kg    Examination: General exam: AAOx3, morbidly obese,NAD, weak appearing. HEENT:Oral mucosa moist, Ear/Nose WNL grossly,dentition normal. Respiratory system: bilaterally diminished,no use of accessory muscle, non tender. Cardiovascular system: S1 & S2 +, regular, No  JVD. Gastrointestinal system: Abdomen soft, NT,ND, BS+. Nervous System:Alert, awake, moving extremities and grossly nonfocal Extremities: Chronic lymphedema on lower extremities with mild tenderness in the right lower extremity  Skin: No rashes,no icterus. MSK: Normal muscle bulk,tone, power  Data Reviewed: I have personally reviewed following labs and imaging studies CBC: Recent Labs  Lab 05/07/20 1754 05/08/20 0937 05/09/20 0434  WBC 4.9  --  4.6  NEUTROABS 3.3  --   --   HGB 12.6 12.6 10.8*  HCT 41.3 37.0 35.5*  MCV 98.8  --  100.3*  PLT 155  --  697*   Basic Metabolic Panel: Recent Labs  Lab 05/07/20 1754 05/08/20 0937 05/09/20 0434  NA 138 139 137  K 4.3 4.1 3.9  CL 96*  --  98  CO2 34*  --  34*  GLUCOSE 108*  --  109*  BUN 29*  --  23  CREATININE 1.48*  --  1.60*  CALCIUM 9.3  --  8.5*   GFR: Estimated Creatinine Clearance: 53.3 mL/min (A) (by C-G formula based on SCr of 1.6 mg/dL (H)). Liver Function Tests: Recent Labs  Lab 05/09/20 0434  AST 17  ALT 16  ALKPHOS 73  BILITOT 0.9  PROT 5.7*  ALBUMIN 3.0*   No results for input(s): LIPASE, AMYLASE in the last 168 hours. No results for input(s): AMMONIA in the last 168 hours. Coagulation Profile: No results for input(s): INR, PROTIME in the last 168 hours. Cardiac Enzymes: No results for input(s): CKTOTAL, CKMB, CKMBINDEX, TROPONINI in the last 168 hours. BNP (last 3 results) No results for input(s): PROBNP in the last 8760 hours. HbA1C: No results for input(s): HGBA1C in the last 72 hours. CBG: Recent Labs  Lab 05/07/20 1858 05/08/20 2151  GLUCAP 102* 107*   Lipid Profile: No results for input(s): CHOL, HDL, LDLCALC, TRIG, CHOLHDL, LDLDIRECT in the last 72 hours. Thyroid Function Tests: No results for input(s): TSH, T4TOTAL, FREET4, T3FREE, THYROIDAB in the last 72 hours. Anemia Panel: No results for input(s): VITAMINB12, FOLATE, FERRITIN, TIBC, IRON, RETICCTPCT in the last 72 hours. Sepsis  Labs: No results for input(s): PROCALCITON, LATICACIDVEN in the last 168 hours.  Recent Results (  from the past 240 hour(s))  Resp Panel by RT-PCR (Flu A&B, Covid) Nasopharyngeal Swab     Status: None   Collection Time: 05/07/20 11:56 PM   Specimen: Nasopharyngeal Swab; Nasopharyngeal(NP) swabs in vial transport medium  Result Value Ref Range Status   SARS Coronavirus 2 by RT PCR NEGATIVE NEGATIVE Final    Comment: (NOTE) SARS-CoV-2 target nucleic acids are NOT DETECTED.  The SARS-CoV-2 RNA is generally detectable in upper respiratory specimens during the acute phase of infection. The lowest concentration of SARS-CoV-2 viral copies this assay can detect is 138 copies/mL. A negative result does not preclude SARS-Cov-2 infection and should not be used as the sole basis for treatment or other patient management decisions. A negative result may occur with  improper specimen collection/handling, submission of specimen other than nasopharyngeal swab, presence of viral mutation(s) within the areas targeted by this assay, and inadequate number of viral copies(<138 copies/mL). A negative result must be combined with clinical observations, patient history, and epidemiological information. The expected result is Negative.  Fact Sheet for Patients:  EntrepreneurPulse.com.au  Fact Sheet for Healthcare Providers:  IncredibleEmployment.be  This test is no t yet approved or cleared by the [place] FDA and  has been authorized for detection and/or diagnosis of SARS-CoV-2 by FDA under an Emergency Use Authorization (EUA). This EUA will remain  in effect (meaning this test can be used) for the duration of the COVID-19 declaration under Section 564(b)(1) of the Act, 21 U.S.C.section 360bbb-3(b)(1), unless the authorization is terminated  or revoked sooner.       Influenza A by PCR NEGATIVE NEGATIVE Final   Influenza B by PCR NEGATIVE NEGATIVE Final     Comment: (NOTE) The Xpert Xpress SARS-CoV-2/FLU/RSV plus assay is intended as an aid in the diagnosis of influenza from Nasopharyngeal swab specimens and should not be used as a sole basis for treatment. Nasal washings and aspirates are unacceptable for Xpert Xpress SARS-CoV-2/FLU/RSV testing.  Fact Sheet for Patients: EntrepreneurPulse.com.au  Fact Sheet for Healthcare Providers: IncredibleEmployment.be  This test is not yet approved or cleared by the [place] FDA and has been authorized for detection and/or diagnosis of SARS-CoV-2 by FDA under an Emergency Use Authorization (EUA). This EUA will remain in effect (meaning this test can be used) for the duration of the COVID-19 declaration under Section 564(b)(1) of the Act, 21 U.S.C. section 360bbb-3(b)(1), unless the authorization is terminated or revoked.  Performed at [facility], [place] [address], [place], [place] [address]      Radiology Studies: DG Chest 2 View  Result Date: 05/07/2020 CLINICAL DATA:  Short of breath.  History CHF EXAM: CHEST - 2 VIEW COMPARISON:  02/23/2019 FINDINGS: Heart size within normal limits. Negative for heart failure or edema. No effusion. Mild bibasilar atelectasis. IMPRESSION: Mild bibasilar atelectasis. Electronically Signed   By: Franchot Gallo M.D.   On: 05/07/2020 19:05   CT Angio Chest PE W and/or Wo Contrast  Result Date: 05/07/2020 CLINICAL DATA:  Shortness of breath EXAM: CT ANGIOGRAPHY CHEST WITH CONTRAST TECHNIQUE: Multidetector CT imaging of the chest was performed using the standard protocol during bolus administration of intravenous contrast. Multiplanar CT image reconstructions and MIPs were obtained to evaluate the vascular anatomy. CONTRAST:  152m OMNIPAQUE IOHEXOL 350 MG/ML SOLN COMPARISON:  Radiograph same day FINDINGS: Cardiovascular: There is slightly suboptimal opacification of the main pulmonary artery however no central or proximal  segmental pulmonary embolism is seen. There is prominence of the right main pulmonary artery which  could be due to pulmonary arterial hypertension. There is mild cardiomegaly. Coronary artery calcifications are seen. No pericardial effusion or thickening. No evidence right heart strain. There is normal three-vessel brachiocephalic anatomy without proximal stenosis. Scattered aortic atherosclerosis is noted. Mediastinum/Nodes: No hilar, mediastinal, or axillary adenopathy. Thyroid gland, trachea, and esophagus demonstrate no significant findings. Lungs/Pleura: Minimal ground-glass opacity seen at both lung bases. No pleural effusion or pneumothorax. No airspace consolidation. Upper Abdomen: No acute abnormalities present in the visualized portions of the upper abdomen. A small hiatal hernia is present. Musculoskeletal: No chest wall abnormality. No acute or significant osseous findings. Degenerative changes are seen in midthoracic spine. Review of the MIP images confirms the above findings. IMPRESSION: Slightly suboptimal opacification of the main pulmonary artery however no central or proximal segmental pulmonary embolism Mild cardiomegaly in findings suggestive of pulmonary arterial hypertension Minimal ground-glass opacity at both lung bases which may be due to atelectasis Small hiatal hernia Aortic Atherosclerosis (ICD10-I70.0). Electronically Signed   By: Prudencio Pair M.D.   On: 05/07/2020 23:59   VAS [place] LOWER EXTREMITY VENOUS (DVT) (ONLY [facility] & [facility])  Result Date: 05/08/2020  Lower Venous DVT Study Indications: Pain, Swelling, and SOB.  Limitations: Body habitus, patient pain tolerance, and limited patient mobility. Comparison Study: 11-13-2018 Bilateral lower extremity venous was limited, but                   negative for DVT. Performing Technologist: Darlin Coco RDMS,RVT  Examination Guidelines: A complete evaluation includes B-mode imaging, spectral Doppler, color Doppler, and power Doppler as needed of all  accessible portions of each vessel. Bilateral testing is considered an integral part of a complete examination. Limited examinations for reoccurring indications may be performed as noted. The reflux portion of the exam is performed with the patient in reverse Trendelenburg.  +---------+---------------+---------+-----------+----------+-------------------+ RIGHT    CompressibilityPhasicitySpontaneityPropertiesThrombus Aging      +---------+---------------+---------+-----------+----------+-------------------+ CFV      Full           Yes      Yes                                      +---------+---------------+---------+-----------+----------+-------------------+ SFJ      Full                                                             +---------+---------------+---------+-----------+----------+-------------------+ FV Prox  Full                                                             +---------+---------------+---------+-----------+----------+-------------------+ FV Mid   Full           Yes      Yes                                      +---------+---------------+---------+-----------+----------+-------------------+ FV Distal  Unable to visualize                                                       due to pain                                                               tolerance/tissue                                                          properties          +---------+---------------+---------+-----------+----------+-------------------+ PFV      Full                                                             +---------+---------------+---------+-----------+----------+-------------------+ POP                                                   Unable to visualize                                                       due to limited                                                             patient mobility    +---------+---------------+---------+-----------+----------+-------------------+ PTV      None           No       No                   Age Indeterminate   +---------+---------------+---------+-----------+----------+-------------------+ PERO                                                  Not well visualized +---------+---------------+---------+-----------+----------+-------------------+   +---------+---------------+---------+-----------+----------+-----------------+ LEFT     CompressibilityPhasicitySpontaneityPropertiesThrombus Aging    +---------+---------------+---------+-----------+----------+-----------------+ CFV      Full           Yes      Yes                                    +---------+---------------+---------+-----------+----------+-----------------+  SFJ      Full                                                           +---------+---------------+---------+-----------+----------+-----------------+ FV Prox  Full                                                           +---------+---------------+---------+-----------+----------+-----------------+ FV Mid   Full           Yes      Yes                                    +---------+---------------+---------+-----------+----------+-----------------+ FV Distal               Yes      Yes                                    +---------+---------------+---------+-----------+----------+-----------------+ PFV      Full                                                           +---------+---------------+---------+-----------+----------+-----------------+ POP      Full           Yes      Yes                                    +---------+---------------+---------+-----------+----------+-----------------+ PTV      Partial        Yes      Yes                  Age Indeterminate +---------+---------------+---------+-----------+----------+-----------------+ PERO                     Yes      Yes                                    +---------+---------------+---------+-----------+----------+-----------------+     Summary: RIGHT: - Findings consistent with age indeterminate deep vein thrombosis involving the right posterior tibial veins. - No cystic structure found in the popliteal fossa.  LEFT: - Findings consistent with age indeterminate deep vein thrombosis involving the left posterior tibial veins. - No cystic structure found in the popliteal fossa.  *See table(s) above for measurements and observations. Electronically signed by Harold Barban MD on 05/08/2020 at 9:59:32 PM.    Final      LOS: 1 day   Antonieta Pert, MD [facility]  05/09/2020, 7:35 AM

## 2020-05-10 ENCOUNTER — Inpatient Hospital Stay (HOSPITAL_COMMUNITY): Payer: Medicare PPO

## 2020-05-10 ENCOUNTER — Ambulatory Visit: Payer: Medicare PPO | Admitting: Physician Assistant

## 2020-05-10 DIAGNOSIS — I5033 Acute on chronic diastolic (congestive) heart failure: Secondary | ICD-10-CM | POA: Diagnosis not present

## 2020-05-10 DIAGNOSIS — I82403 Acute embolism and thrombosis of unspecified deep veins of lower extremity, bilateral: Secondary | ICD-10-CM | POA: Diagnosis not present

## 2020-05-10 LAB — BASIC METABOLIC PANEL
Anion gap: 8 (ref 5–15)
BUN: 20 mg/dL (ref 8–23)
CO2: 32 mmol/L (ref 22–32)
Calcium: 8.5 mg/dL — ABNORMAL LOW (ref 8.9–10.3)
Chloride: 99 mmol/L (ref 98–111)
Creatinine, Ser: 1.62 mg/dL — ABNORMAL HIGH (ref 0.44–1.00)
GFR, Estimated: 34 mL/min — ABNORMAL LOW (ref >60)
Glucose, Bld: 164 mg/dL — ABNORMAL HIGH (ref 70–99)
Potassium: 4 mmol/L (ref 3.5–5.1)
Sodium: 139 mmol/L (ref 135–145)

## 2020-05-10 LAB — CBC
HCT: 36.3 % (ref 36.0–46.0)
Hemoglobin: 11 g/dL — ABNORMAL LOW (ref 12.0–15.0)
MCH: 30.2 pg (ref 26.0–34.0)
MCHC: 30.3 g/dL (ref 30.0–36.0)
MCV: 99.7 fL (ref 80.0–100.0)
Platelets: 131 10*3/uL — ABNORMAL LOW (ref 150–400)
RBC: 3.64 MIL/uL — ABNORMAL LOW (ref 3.87–5.11)
RDW: 14.9 % (ref 11.5–15.5)
WBC: 4.6 10*3/uL (ref 4.0–10.5)
nRBC: 0 % (ref 0.0–0.2)

## 2020-05-10 LAB — ECHOCARDIOGRAM COMPLETE
AR max vel: 1.39 cm2
AV Area VTI: 1.4 cm2
AV Area mean vel: 1.4 cm2
AV Mean grad: 5 mmHg
AV Peak grad: 8.9 mmHg
Ao pk vel: 1.49 m/s
Area-P 1/2: 2.87 cm2
Height: 64 in
S' Lateral: 2.4 cm
Single Plane A4C EF: 78.2 %
Weight: 6158.77 oz

## 2020-05-10 MED ORDER — POTASSIUM CHLORIDE CRYS ER 20 MEQ PO TBCR
40.0000 meq | EXTENDED_RELEASE_TABLET | ORAL | Status: AC
  Administered 2020-05-10 (×2): 40 meq via ORAL
  Filled 2020-05-10: qty 2

## 2020-05-10 MED ORDER — METOLAZONE 5 MG PO TABS
5.0000 mg | ORAL_TABLET | Freq: Once | ORAL | Status: AC
  Administered 2020-05-10: 5 mg via ORAL
  Filled 2020-05-10: qty 1

## 2020-05-10 NOTE — TOC Progression Note (Signed)
Transition of Care [facility]) - Progression Note    Patient Details  Name: [name] MRN: [id number] Date of Birth: [birth year]  [facility]) CM/SW Contact  Loletha Grayer Beverely Pace, RN Phone Number: 05/10/2020, 8:13 AM  Clinical Narrative:   Case manager arranged for home oxygen for patient, had discussed possible need on yesterday.TOC Team will continue to monitor for further needs. .       Barriers to Discharge: Continued Medical Work up  Expected Discharge Plan and Services     Discharge Planning Services: CM Consult Post Acute Care Choice: Durable Medical Equipment Living arrangements for the past 2 months: Single Family Home                 DME Arranged: Oxygen DME Agency: [employer] Date DME Agency Contacted: 05/10/20 Time DME Agency Contacted: [phone]               Social Determinants of Health (SDOH) Interventions    Readmission Risk Interventions No flowsheet data found.

## 2020-05-10 NOTE — Progress Notes (Signed)
PROGRESS NOTE    April [name]  RJJ:[id number] DOB: November 03, 1950 DOA: 05/07/2020 PCP: [name], MD   Chief Complaint  Patient presents with  . Shortness of Breath  Brief Narrative: 70 year old female with history of HTN, HLD, CAD, CHF, asthma, CKD stage IIIb, T2DM, anemia, morbid obesity, history of cellulitis presented with shortness of breath, leg pain with progressively worsening shortness of breath, difficulty 10 days, walk to the bathroom.  Was having intermittent wheezing and mild productive cough with yellow sputum x1 week.  Baseline echocardiogram.,  But mostly sedentary, has gained 30 pound weight since January. In the ED, patient was seen to be afebrile, blood pressure 100/55-140/72, and O2 saturations noted to be as low as 85% currently maintained on 2 L of oxygen.Labs significant for chloride 96, CO2 34, BUN 29, and creatinine 1.48.Angiogram of the chest with contrast was obtained showing no clear signs of PE,mild cardiomegaly, pulmonary artery hypertension, small hiatal hernia, and likely atelectasis.Influenza and COVID-19 screening were negative.  Patient had been started on clindamycin IV.[id number] 69 pH 7.3, PO2 112. Patient was also found to have is indeterminate bilateral lower extremity DVT and volume overload.  Subjective: Seen and examined this morning. Afebrile overnight Oxygen down to 1 L. Bruise on right upper extremity above the elbow posteriorly-has slightly increased from yesterday evening to last night and has been stable since, ultrasound done overnight and result pending.  Assessment & Plan:  Bilateral is indeterminate DVT of  Tibial veins,  with possible superimposed cellulitis Chronic lymphedema: Patient w/ worsening bilateral leg swelling and pain, has chronic lymphedema and on exam suspected to have cellulitis in the setting of fluid overload and duplex bilateral age-indeterminate DVT.  Continue Lovenox, plan to transition to [place] prior to discharge.   Continue diuretics as per cardio. TSH stable, has no leukocytosis or fever.   Right upper extremity bruise/swelling follow-up ultrasound soft tissue. ?  Recent Trauma related in the setting of anticoagulation vs infection-follow-up soft tissue ultrasound, small area of firm swelling in the middle of the bruise with tenderness- ??abscess-monitor.  Patient has no fever.  She is on clindamycin as above. Recent Labs  Lab 05/07/20 1754 05/09/20 0434 05/10/20 0844  WBC 4.9 4.6 4.6   Acute hypoxic respiratory failure suspect component of CHF along chronic component with OSA/OHS W/ secondary pulmonary hypertension. CT angio did not show acute PE.PCCM Dr. Erskine Emery has reviewed the case and plans for outpatient follow-up sleep apnea evaluation.  Will need to continue with oxygen, will arrange for home oxygen, continue diuresis, wean oxygen as tolerated.    Asthma without exacerbation continue Pulmicort, nebs.  Acute on Chronice diastolic congestive heart failure although normal BNP 60.9 in the setting of obesity.  Grossly fluid overloaded-continue IV Lasix, appreciate advanced heart failure team follow-up.  Continue Lasix per cardiology, follow-up echocardiogram.  Continue on Aldactone, Coreg.  Monitor intake output Daily weight as below.   Net IO Since Admission: -1,587 mL [05/10/20 1116]  Filed Weights   05/08/20 1227 05/10/20 0411  Weight: (!) 172.4 kg (!) 174.6 kg   Mildly elevated troponin suspect demand ischemia due to hypoxia. Trop 22> 15- normalized. HTN: Well-controlled.  Continue Coreg 12.5 mg twice daily and Aldactone 25 mg daily HLD: Cont simvastatin 40 mg   Chronic arthritis of the knee, Dr. [name] input appreciated.  Patient will follow up outpatient for a steroid injection.  GERD continue PPI  CKD stage IIIb: Overall stable monitor closely while on diuresis.  Recent Labs  03/08/20 1347 05/07/20 1754 05/09/20 0434 05/10/20 0844  BUN 34* 29* 23 20  CREATININE 1.70* 1.48*  1.60* 1.62*   T2DM, last A1c 6.4.  Continue diet modification diet. Recent Labs  Lab 05/07/20 1858 05/08/20 2151  GLUCAP 102* 107*   Chronic anemia: In the setting of chronic disease overall stable.  Monitor hemoglobin. Recent Labs  Lab 05/07/20 1754 05/08/20 0937 05/09/20 0434 05/10/20 0844  HGB 12.6 12.6 10.8* 11.0*  HCT 41.3 37.0 35.5* 36.3   Morbid obesity BMI 65 , ABG suggests OSA/versus with secondary pulmonary hypertension Dr. Erskine Emery is planning to have expedited sleep study and outpatient follow-up.  Will benefit with outpatient follow-up and weight loss.   Diet Order            Diet heart healthy/carb modified Room service appropriate? Yes; Fluid consistency: Thin  Diet effective now                Patient's Body mass index is 66.07 kg/m. DVT prophylaxis: lovenox Code Status:   Code Status: Full Code  Family Communication: plan of care discussed with patient at bedside.  Patient self updating.  Status is: Inpatient Remains inpatient appropriate because:Inpatient level of care appropriate due to severity of illness  Dispo: The patient is from: Home              Anticipated d/c is to: TBD- [facility] SNF. Cont PT OT               Patient currently is not medically stable to d/c.   Difficult to place patient No   Unresulted Labs (From admission, onward)          Start     Ordered   05/11/20 7026  Basic metabolic panel  Daily,   R     Question:  Specimen collection method  Answer:  Lab=Lab collect   05/10/20 0735   05/11/20 0500  CBC  Daily,   R     Question:  Specimen collection method  Answer:  Lab=Lab collect   05/10/20 0735   05/09/20 0500  CBC  Every 72 hours,   R      05/08/20 1233   05/08/20 0904  Blood gas, arterial  Once,   R        05/08/20 0903          Medications reviewed:  Scheduled Meds: . allopurinol  100 mg Oral Daily  . arformoterol  15 mcg Nebulization BID  . budesonide (PULMICORT) nebulizer solution  0.5 mg Nebulization BID  .  carvedilol  12.5 mg Oral BID WC  . cyanocobalamin  1,000 mcg Intramuscular Q30 days  . enoxaparin (LOVENOX) injection  1 mg/kg Subcutaneous Q12H  . famotidine  20 mg Oral Daily  . ferrous sulfate  325 mg Oral Q M,W,F  . folic acid  378 mcg Oral QHS  . furosemide  80 mg Intravenous BID  . gabapentin  100 mg Oral TID  . metolazone  5 mg Oral Once  . montelukast  10 mg Oral QHS  . Oxcarbazepine  300 mg Oral BID  . potassium chloride  20 mEq Oral Daily  . potassium chloride  40 mEq Oral Q4H  . saccharomyces boulardii  250 mg Oral BID  . simvastatin  40 mg Oral QHS  . sodium chloride flush  3 mL Intravenous Q12H  . spironolactone  25 mg Oral Daily   Continuous Infusions: . clindamycin (CLEOCIN) IV 600 mg (05/10/20 0455)  Consultants: Cardiology  Procedures:see note  Antimicrobials: Anti-infectives (From admission, onward)   Start     Dose/Rate Route Frequency Ordered Stop   05/08/20 1100  clindamycin (CLEOCIN) IVPB 600 mg        600 mg 100 mL/hr over 30 Minutes Intravenous Every 8 hours 05/08/20 0913     05/08/20 0215  clindamycin (CLEOCIN) IVPB 600 mg        600 mg 100 mL/hr over 30 Minutes Intravenous  Once 05/08/20 0211 05/08/20 0407     Culture/Microbiology    Component Value Date/Time   SDES URINE, RANDOM 02/15/2019 1815   SPECREQUEST  02/15/2019 1815    NONE Performed at [facility], [place] [address] [place], [place] [address]    CULT (A) 02/15/2019 1815    80,000 COLONIES/mL CITROBACTER FREUNDII 80,000 COLONIES/mL PSEUDOMONAS AERUGINOSA    REPTSTATUS 02/17/2019 FINAL 02/15/2019 1815    Other culture-see note  Objective: Vitals: Today's Vitals   05/10/20 0827 05/10/20 0835 05/10/20 0840 05/10/20 0955  BP:    95/80  Pulse:  82  82  Resp:  18  18  Temp:    98.2 F (36.8 C)  TempSrc:      SpO2:  95% 95% 96%  Weight:      Height:      PainSc: 10-Worst pain ever       Intake/Output Summary (Last 24 hours) at 05/10/2020 1116 Last data filed at  05/10/2020 0830 Gross per 24 hour  Intake 1020 ml  Output 1600 ml  Net -580 ml   Filed Weights   05/08/20 1227 05/10/20 0411  Weight: (!) 172.4 kg (!) 174.6 kg   Weight change: 2.233 kg  Intake/Output from previous day: 04/07 0701 - 04/08 0700 In: 783 [P.O.:780; I.V.:3] Out: 1600 [Urine:1600] Intake/Output this shift: Total I/O In: 480 [P.O.:480] Out: -  Filed Weights   05/08/20 1227 05/10/20 0411  Weight: (!) 172.4 kg (!) 174.6 kg    Examination: General exam: AAOx3, morbidly obese on oxygen,NAD, weak appearing. HEENT:Oral mucosa moist, Ear/Nose WNL grossly, dentition normal. Respiratory system: bilaterally diminishedd,no wheezing or crackles,no use of accessory muscle Cardiovascular system: S1 & S2 +, No JVD,. Gastrointestinal system: Abdomen soft, obese,NT,ND, BS+ Nervous System:Alert, awake, moving extremities and grossly nonfocal Extremities: Bilateral lower leg edema, distal peripheral pulses palpable. RUE elbow area with bruise small firm tender palpable swelling in the center-se pic Skin: No rashes,no icterus. MSK: Normal muscle bulk,tone, power   Data Reviewed: I have personally reviewed following labs and imaging studies CBC: Recent Labs  Lab 05/07/20 1754 05/08/20 0937 05/09/20 0434 05/10/20 0844  WBC 4.9  --  4.6 4.6  NEUTROABS 3.3  --   --   --   HGB 12.6 12.6 10.8* 11.0*  HCT 41.3 37.0 35.5* 36.3  MCV 98.8  --  100.3* 99.7  PLT 155  --  148* 546*   Basic Metabolic Panel: Recent Labs  Lab 05/07/20 1754 05/08/20 0937 05/09/20 0434 05/10/20 0844  NA 138 139 137 139  K 4.3 4.1 3.9 4.0  CL 96*  --  98 99  CO2 34*  --  34* 32  GLUCOSE 108*  --  109* 164*  BUN 29*  --  23 20  CREATININE 1.48*  --  1.60* 1.62*  CALCIUM 9.3  --  8.5* 8.5*   GFR: Estimated Creatinine Clearance: 53.1 mL/min (A) (by C-G formula based on SCr of 1.62 mg/dL (H)). Liver Function Tests: Recent Labs  Lab 05/09/20 0434  AST 17  ALT 16  ALKPHOS 73  BILITOT 0.9   PROT 5.7*  ALBUMIN 3.0*   No results for input(s): LIPASE, AMYLASE in the last 168 hours. No results for input(s): AMMONIA in the last 168 hours. Coagulation Profile: No results for input(s): INR, PROTIME in the last 168 hours. Cardiac Enzymes: No results for input(s): CKTOTAL, CKMB, CKMBINDEX, TROPONINI in the last 168 hours. BNP (last 3 results) No results for input(s): PROBNP in the last 8760 hours. HbA1C: No results for input(s): HGBA1C in the last 72 hours. CBG: Recent Labs  Lab 05/07/20 1858 05/08/20 2151  GLUCAP 102* 107*   Lipid Profile: No results for input(s): CHOL, HDL, LDLCALC, TRIG, CHOLHDL, LDLDIRECT in the last 72 hours. Thyroid Function Tests: No results for input(s): TSH, T4TOTAL, FREET4, T3FREE, THYROIDAB in the last 72 hours. Anemia Panel: No results for input(s): VITAMINB12, FOLATE, FERRITIN, TIBC, IRON, RETICCTPCT in the last 72 hours. Sepsis Labs: No results for input(s): PROCALCITON, LATICACIDVEN in the last 168 hours.  Recent Results (from the past 240 hour(s))  Resp Panel by RT-PCR (Flu A&B, Covid) Nasopharyngeal Swab     Status: None   Collection Time: 05/07/20 11:56 PM   Specimen: Nasopharyngeal Swab; Nasopharyngeal(NP) swabs in vial transport medium  Result Value Ref Range Status   SARS Coronavirus 2 by RT PCR NEGATIVE NEGATIVE Final    Comment: (NOTE) SARS-CoV-2 target nucleic acids are NOT DETECTED.  The SARS-CoV-2 RNA is generally detectable in upper respiratory specimens during the acute phase of infection. The lowest concentration of SARS-CoV-2 viral copies this assay can detect is 138 copies/mL. A negative result does not preclude SARS-Cov-2 infection and should not be used as the sole basis for treatment or other patient management decisions. A negative result may occur with  improper specimen collection/handling, submission of specimen other than nasopharyngeal swab, presence of viral mutation(s) within the areas targeted by this  assay, and inadequate number of viral copies(<138 copies/mL). A negative result must be combined with clinical observations, patient history, and epidemiological information. The expected result is Negative.  Fact Sheet for Patients:  EntrepreneurPulse.com.au  Fact Sheet for Healthcare Providers:  IncredibleEmployment.be  This test is no t yet approved or cleared by the [place] FDA and  has been authorized for detection and/or diagnosis of SARS-CoV-2 by FDA under an Emergency Use Authorization (EUA). This EUA will remain  in effect (meaning this test can be used) for the duration of the COVID-19 declaration under Section 564(b)(1) of the Act, 21 U.S.C.section 360bbb-3(b)(1), unless the authorization is terminated  or revoked sooner.       Influenza A by PCR NEGATIVE NEGATIVE Final   Influenza B by PCR NEGATIVE NEGATIVE Final    Comment: (NOTE) The Xpert Xpress SARS-CoV-2/FLU/RSV plus assay is intended as an aid in the diagnosis of influenza from Nasopharyngeal swab specimens and should not be used as a sole basis for treatment. Nasal washings and aspirates are unacceptable for Xpert Xpress SARS-CoV-2/FLU/RSV testing.  Fact Sheet for Patients: EntrepreneurPulse.com.au  Fact Sheet for Healthcare Providers: IncredibleEmployment.be  This test is not yet approved or cleared by the [place] FDA and has been authorized for detection and/or diagnosis of SARS-CoV-2 by FDA under an Emergency Use Authorization (EUA). This EUA will remain in effect (meaning this test can be used) for the duration of the COVID-19 declaration under Section 564(b)(1) of the Act, 21 U.S.C. section 360bbb-3(b)(1), unless the authorization is terminated or revoked.  Performed at [facility], [place]  [address] [place], [place] [address]      Radiology Studies: [place] RT UPPER EXTREM LTD SOFT TISSUE NON VASCULAR  Result  Date: 05/10/2020 CLINICAL DATA:  Right upper arm cellulitis EXAM: ULTRASOUND right UPPER EXTREMITY LIMITED TECHNIQUE: Ultrasound examination of the upper extremity soft tissues was performed in the area of clinical concern. COMPARISON:  None. FINDINGS: Today's sonogram of the area of concern along the back of the right upper arm demonstrates no soft tissue mass, underlying cyst, or appreciable abscess in the visualized soft tissues. IMPRESSION: 1. No abscess, cystic lesion, or soft tissue mass is sonographically apparent in the region of concern. Electronically Signed   By: Van Clines M.D.   On: 05/10/2020 09:50     LOS: 2 days   Antonieta Pert, MD [facility]  05/10/2020, 11:16 AM

## 2020-05-10 NOTE — Progress Notes (Addendum)
Advanced Heart Failure Team Consult Note   Primary Physician: [name], MD PCP-Cardiologist:  Dr. Haroldine Laws   Reason for Consultation: Acute on Chronic Diastolic Heart Failure   HPI:    Bed weight is up 4lbs today, 1.6L output recorded yesterday has a purewick in.  Says her breathing is about the same.     Objective:    Vital Signs:   Temp:  [98.2 F (36.8 C)-99.8 F (37.7 C)] 98.2 F (36.8 C) (04/08 0411) Pulse Rate:  [73-90] 73 (04/08 0411) Resp:  [18-20] 20 (04/08 0411) BP: (107-138)/(56-73) 138/73 (04/08 0411) SpO2:  [92 %-97 %] 95 % (04/08 0411) Weight:  [174.6 kg] 174.6 kg (04/08 0411) Last BM Date: 05/08/20  Weight change: Filed Weights   05/08/20 1227 05/10/20 0411  Weight: (!) 172.4 kg (!) 174.6 kg    Intake/Output:   Intake/Output Summary (Last 24 hours) at 05/10/2020 0751 Last data filed at 05/10/2020 0500 Gross per 24 hour  Intake 783 ml  Output 1600 ml  Net -817 ml      Physical Exam    Cardiac: JVD to jaw, normal rate and rhythm, distant heart sounds, 1+ bilateral LE edema Pulmonary: upper airway wheezing, not in distress Abdominal: obese abdomen, soft and nontender Psych: Alert, conversant  Telemetry   NSR 70s   EKG    No new ecg  Labs   Basic Metabolic Panel: Recent Labs  Lab 05/07/20 1754 05/08/20 0937 05/09/20 0434  NA 138 139 137  K 4.3 4.1 3.9  CL 96*  --  98  CO2 34*  --  34*  GLUCOSE 108*  --  109*  BUN 29*  --  23  CREATININE 1.48*  --  1.60*  CALCIUM 9.3  --  8.5*    Liver Function Tests: Recent Labs  Lab 05/09/20 0434  AST 17  ALT 16  ALKPHOS 73  BILITOT 0.9  PROT 5.7*  ALBUMIN 3.0*   No results for input(s): LIPASE, AMYLASE in the last 168 hours. No results for input(s): AMMONIA in the last 168 hours.  CBC: Recent Labs  Lab 05/07/20 1754 05/08/20 0937 05/09/20 0434  WBC 4.9  --  4.6  NEUTROABS 3.3  --   --   HGB 12.6 12.6 10.8*  HCT 41.3 37.0 35.5*  MCV 98.8  --  100.3*  PLT 155  --   148*    Cardiac Enzymes: No results for input(s): CKTOTAL, CKMB, CKMBINDEX, TROPONINI in the last 168 hours.  BNP: BNP (last 3 results) Recent Labs    10/20/19 1226 05/07/20 1754  BNP 51.9 60.9    ProBNP (last 3 results) No results for input(s): PROBNP in the last 8760 hours.   CBG: Recent Labs  Lab 05/07/20 1858 05/08/20 2151  GLUCAP 102* 107*    Coagulation Studies: No results for input(s): LABPROT, INR in the last 72 hours.   Imaging   No results found.   Medications:     Current Medications: . allopurinol  100 mg Oral Daily  . arformoterol  15 mcg Nebulization BID  . budesonide (PULMICORT) nebulizer solution  0.5 mg Nebulization BID  . carvedilol  12.5 mg Oral BID WC  . cyanocobalamin  1,000 mcg Intramuscular Q30 days  . enoxaparin (LOVENOX) injection  1 mg/kg Subcutaneous Q12H  . famotidine  20 mg Oral Daily  . ferrous sulfate  325 mg Oral Q M,W,F  . folic acid  629 mcg Oral QHS  . furosemide  80 mg  Intravenous BID  . gabapentin  100 mg Oral TID  . montelukast  10 mg Oral QHS  . Oxcarbazepine  300 mg Oral BID  . potassium chloride  20 mEq Oral Daily  . saccharomyces boulardii  250 mg Oral BID  . simvastatin  40 mg Oral QHS  . sodium chloride flush  3 mL Intravenous Q12H  . spironolactone  25 mg Oral Daily    Infusions: . clindamycin (CLEOCIN) IV 600 mg (05/10/20 0455)     Assessment/Plan   1. Acute Hypoxic Respiratory Failure - O2 sats initially in the 80s in ED, improved w/ Supp O2 - Admit CXR unremarkable. No edema, effusions or infiltrates - Chest CT negative for PE - BNP normal at 60 but likely falsely low due to obesity. Volume overloaded on exam c/w CHF - multifactorial 2/2 acute asthma exacerbation + OHS + a/c dCHF  2. Bilateral LE DVT  - confirmed by venous dopplers. Chest CT negative for PE - on Lovenox, will need transition to oral anticoagulant eventually  3. Acute on Chronic Diastolic Heart Failure - RHC 2008 c/w mild  pulmonary hypertension, primarily due to high output state. Her mean pulmonary artery pressure was 27, wedge pressure was 10. PVR was 1.80 [place] units. - Echo 2017 LVEF 65-70%, G1DD, RV normal - Repeat Echo pending - Output not great with IV lasix 80 BID,  add metolazone 5mg  with potassium supplementation  - Suspect underlying OSA/OHS. Needs sleep study  - continue spiro 25 mg daily  - no SGLT2i given risk of GU infection given morbid obesity/ large panus   4. Acute Asthma Exacerbation  - management per triad  - respiratory status improving w/ treatment   5. ? LE Cellulitis  - AF. WBC normal - abx per triad   6. CAD:  - LHC in 2008 showed 50-60% LAD lesion and 30% RCA lesion - no ischemic like CP. EKG shows NSR no ST abnormality. Hs trop low level and flat, not c/w ACS - c/w  blocker and statin - should be on ASA as long as no contraindication   7. Stage III CKD - Baseline SCr ~1.5 - 1.6 today, monitor w/ diuresis    Length of Stay: 2  Katherine Roan, MD  05/10/2020, 7:51 AM  Advanced Heart Failure Team Pager [phone] (M-F; 7a - 5p)  Please contact [facility] for night-coverage after hours (4p -7a ) and weekends on [email or web address]  Patient seen and examined with the above-signed Advanced Practice Provider and/or Housestaff. I personally reviewed laboratory data, imaging studies and relevant notes. I independently examined the patient and formulated the important aspects of the plan. I have edited the note to reflect any of my changes or salient points. I have personally discussed the plan with the patient and/or family.  She continues to c/o SOB. Only modest response to diuretics. Cellulitis improving.   General:  Obese woman lying in bed No resp difficulty HEENT: normal Neck: supple. Hard to see JVP well. Looks mildly elevated . Carotids 2+ bilat; no bruits. No lymphadenopathy or thryomegaly appreciated. Cor: PMI nondisplaced. Regular rate & rhythm. No rubs, gallops or  murmurs. Lungs: clear Abdomen: obese  soft, nontender, nondistended. No hepatosplenomegaly. No bruits or masses. Good bowel sounds. Extremities: no cyanosis, clubbing, rash, mild edema RLE > LLE  Neuro: alert & orientedx3, cranial nerves grossly intact. moves all 4 extremities w/o difficulty. Affect pleasant  Volume status very hard to assess given her size but I do no think that she  is that volume overloaded. Agree with continuing IV lasix for now and adding metolazone. If creatinine bumps can stop diuresis. Continue treatment for BLD DVT. Cellulitis improved.   Glori Bickers, MD  7:09 PM

## 2020-05-10 NOTE — Progress Notes (Signed)
Patient stated she wanted to wear CPAP. RT came to beside and patient refused to wear it tonight. Patient in no distress.

## 2020-05-10 NOTE — Progress Notes (Signed)
SATURATION QUALIFICATIONS: (This note is used to comply with regulatory documentation for home oxygen)  Patient Saturations on Room Air at Rest = 97%  Patient Saturations on Room Air while Ambulating = 83%  Patient Saturations on 2 Liters of oxygen while Ambulating = 94%  Please briefly explain why patient needs home oxygen:  Patient attempted side stepping beside edge of bed on room air patient desat to 83% therefore 2L donned.   Vira Agar, RN

## 2020-05-10 NOTE — Progress Notes (Signed)
Patient is a 70 year old woman well-known to our practice.  She is currently hospitalized for DVTs.  Was asking for a steroid injection into her knee.  Also concerned that she has some discoloration to her right upper arm and says an ultrasound was done last night  Right knee no effusion no soft tissue swelling tender to palpation.  Right arm she has a discoloration approximately 10 cm in diameter on the upper outside arm.  There is some firmness in the central portion of this.  No ascending cellulitis   I explained to the patient that we could not do a steroid injection while she is in the hospital but certainly can pursue this point she was discharged.

## 2020-05-10 NOTE — TOC Progression Note (Addendum)
Transition of Care [facility]) - Progression Note    Patient Details  Name: April [name] MRN: [id number] Date of Birth: [birth year]  Transition of [facility]) CM/SW Contact  Bartholomew Crews, RN Phone Number: [phone] 05/10/2020, 4:26 PM  Clinical Narrative:     Patient accepted bed offer from [facility]. Insurance authorization initiated with pending auth [id number]. Patient will need covid test within 24 hours of admit. Patient not medically stable, but will be able to admit over weekend on Sunday if medically stable and authorization received.   Patient asking about her wheelchair tires flaking. Thinks chair may have been provided by [employer]. [employer] to identify if chair was provided by them and will request repair if chair was provided by [employer]. UPDATE - Notified by [employer] that they will be switching out her wheelchair.   TOC following for transition needs.   Expected Discharge Plan: Skilled Nursing Facility Barriers to Discharge: Continued Medical Work up  Expected Discharge Plan and Services Expected Discharge Plan: [place] In-house Referral: Clinical Social Work,[facility] Discharge Planning Services: CM Consult Post Acute Care Choice: Durable Medical Equipment (hospital bed; bsc; rw; ramp; wheelchair) Living arrangements for the past 2 months: Single Family Home                 DME Arranged: Oxygen DME Agency: [employer] Date DME Agency Contacted: 05/10/20 Time DME Agency Contacted: 1100 Representative spoke with at DME Agency: [place]: RN,PT,OT [place] Agency: [facility] (formerly [employer]) (now known as [employer]) Date [place]: 05/10/20 Time [place]: 1115 Representative spoke with at [place]: [place]   Social Determinants of Health ([place]) Interventions    Readmission Risk Interventions No flowsheet data found.

## 2020-05-10 NOTE — TOC Initial Note (Signed)
Transition of Care [facility]) - Initial/Assessment Note    Patient Details  Name: April [name] MRN: [id number] Date of Birth: 1950/10/26  Transition of [facility]) CM/SW Contact:    Bartholomew Crews, RN Phone Number: [phone] 05/10/2020, 12:26 PM  Clinical Narrative:                  Spoke with patient at the bedside this AM. Patient sitting up in her wheelchair with oxygen. PTA was not on oxygen. Stated that she has been living at home with her husband the last couple months. She stated that she was discharged from [facility] the end of January after receiving care for a little over a year. She stated that she had met her goals and was ready to go back home. Stated that spouse provides transportation to medical appointments. She has a ramp, wheelchair, hospital bed, RW, and BSC at home. She is currently active with [facility] - spoke with [place] who stated that they can accept her back for RN, PT, OT - she will need [place] orders and Face to Face is she discharges home.   Previous NCM initiated set up for home oxygen through [facility]. Spoke with Thedore Mins at [employer] to advise that patient not transitioning home today. Patient will need walking ambulation note within 24h of transition home.   Discussed with patient recommendations for SNF. Patient stated that she is agreeable for STR if covered by her insurance. Her first choice is [facility] if bed available; however, she is not opposed to returning to [facility] if no other bed available. Patient agreeable to the initiation of SNF process.   TOC following for transition needs.   Expected Discharge Plan: Skilled Nursing Facility Barriers to Discharge: Continued Medical Work up   Patient Goals and CMS Choice Patient states their goals for this hospitalization and ongoing recovery are:: agreeable to STR CMS Medicare.gov Compare Post Acute Care list provided to:: Patient Choice offered to / list presented to : Patient  Expected Discharge Plan and  Services Expected Discharge Plan: [place] In-house Referral: Clinical Social Work,[facility] Discharge Planning Services: CM Consult Post Acute Care Choice: Durable Medical Equipment (hospital bed; bsc; rw; ramp; wheelchair) Living arrangements for the past 2 months: Single Family Home                 DME Arranged: Oxygen DME Agency: [facility] Date DME Agency Contacted: 05/10/20 Time DME Agency Contacted: 45 Representative spoke with at DME Agency: Thedore Mins HH Arranged: RN,PT,OT [place] Agency: [facility] (formerly [employer]) (now known as [employer]) Date [place]: 05/10/20 Time [place]: 1115 Representative spoke with at [place]: [place]  Prior Living Arrangements/Services Living arrangements for the past 2 months: [place] Lives with:: Spouse Patient language and need for interpreter reviewed:: Yes Do you feel safe going back to the place where you live?: Yes      Need for Family Participation in Patient Care: Yes (Comment) Care giver support system in place?: Yes (comment) Current home services: Home OT,Home PT,Home RN ([facility]) Criminal Activity/Legal Involvement Pertinent to Current Situation/Hospitalization: No - Comment as needed  Activities of Daily Living      Permission Sought/Granted                  Emotional Assessment Appearance:: Appears stated age Attitude/Demeanor/Rapport: Engaged Affect (typically observed): Accepting Orientation: : Oriented to Self,Oriented to  Time,Oriented to Place,Oriented to Situation Alcohol / Substance Use: Not Applicable Psych  Involvement: No (comment)  Admission diagnosis:  Shortness of breath [R06.02] SOB (shortness of breath) [R06.02] Cellulitis, leg [L03.119] Hypoxia [R09.02] Cellulitis, unspecified cellulitis site [L03.90] Patient Active Problem List   Diagnosis Date Noted  . Cellulitis, leg 05/08/2020  . Leg DVT (deep venous thromboembolism), acute,  bilateral ([place]) 05/08/2020  . Acute respiratory failure with hypoxia and hypercapnia ([place]) 05/08/2020  . SOB (shortness of breath)   . Acute idiopathic gout of right foot   . Morbid obesity ([place])   . Idiopathic chronic gout of multiple sites without tophus   . Cellulitis 02/02/2019  . Pneumonia due to COVID-19 virus 12/10/2018  . COVID-19 virus infection 12/06/2018  . Right hip pain 11/09/2018  . Osteomyelitis of ankle or foot, acute, left ([place]) 09/07/2018  . Osteomyelitis of ankle or foot, left, acute ([place]) 09/07/2018  . Onychomycosis 05/19/2018  . Idiopathic chronic venous hypertension of right lower extremity with ulcer and inflammation ([place]) 05/19/2018  . Skin lumps 01/14/2018  . Bilateral leg cramps 12/14/2017  . Left shoulder pain 08/14/2016  . Meralgia paresthetica 07/16/2016  . Chronic left-sided low back pain with left-sided sciatica 06/02/2016  . Whole body pain 03/20/2016  . Osteopenia 01/11/2016  . CKD (chronic kidney disease) stage 3, GFR 30-59 ml/min (HCC) 01/02/2016  . Hand paresthesia 07/18/2015  . Carpal tunnel syndrome 06/07/2015  . Family history of colon cancer   . Diabetes mellitus with neurological manifestations ([place]) 04/18/2015  . Bilateral leg edema 04/11/2015  . Abnormality of gait 01/03/2015  . Hereditary and idiopathic peripheral neuropathy 01/03/2015  . GERD (gastroesophageal reflux disease) 06/17/2014  . Hx of colonic polyps 12/15/2012  . Vitamin B12 deficiency 11/03/2012  . Intrinsic asthma 03/23/2012  . Chronic diastolic heart failure ([place]) 02/20/2011  . OSA (obstructive sleep apnea) 09/17/2010  . URINARY URGENCY 01/08/2010  . CAD, NATIVE VESSEL 11/20/2008  . Osteoarthritis of knees, bilateral 06/14/2008  . Hyperlipidemia 05/10/2007  . Essential hypertension 01/18/2007  . Hypokalemia 04/30/2006  . HX, PERSONAL, PEPTIC ULCER DISEASE 04/30/2006   PCP:  [name], MD Pharmacy:   [facility] [place], [place] - [address] [place] [address] Phone: [phone] Fax: [phone]     Social Determinants of Health (SDOH) Interventions    Readmission Risk Interventions No flowsheet data found.

## 2020-05-10 NOTE — NC FL2 (Signed)
[place] MEDICAID FL2 LEVEL OF CARE SCREENING TOOL     IDENTIFICATION  Patient Name: April [name] Birthdate: 1950-12-23 Sex: female Admission Date (Current Location): 05/07/2020  [facility] and [place] Number:  Herbalist and Address:  [facility] [place] [address] [place], [place] [address]      Provider Number: [id number]  Attending Physician Name and Address:  Antonieta Pert, MD  Relative Name and Phone Number:       Current Level of Care: Hospital Recommended Level of Care: [place] Prior Approval Number:    Date Approved/Denied:   PASRR Number: [id number]  Discharge Plan: SNF    Current Diagnoses: Patient Active Problem List   Diagnosis Date Noted  . Cellulitis, leg 05/08/2020  . Leg DVT (deep venous thromboembolism), acute, bilateral ([place]) 05/08/2020  . Acute respiratory failure with hypoxia and hypercapnia ([place]) 05/08/2020  . SOB (shortness of breath)   . Acute idiopathic gout of right foot   . Morbid obesity ([place])   . Idiopathic chronic gout of multiple sites without tophus   . Cellulitis 02/02/2019  . Pneumonia due to COVID-19 virus 12/10/2018  . COVID-19 virus infection 12/06/2018  . Right hip pain 11/09/2018  . Osteomyelitis of ankle or foot, acute, left ([place]) 09/07/2018  . Osteomyelitis of ankle or foot, left, acute ([place]) 09/07/2018  . Onychomycosis 05/19/2018  . Idiopathic chronic venous hypertension of right lower extremity with ulcer and inflammation ([place]) 05/19/2018  . Skin lumps 01/14/2018  . Bilateral leg cramps 12/14/2017  . Left shoulder pain 08/14/2016  . Meralgia paresthetica 07/16/2016  . Chronic left-sided low back pain with left-sided sciatica 06/02/2016  . Whole body pain 03/20/2016  . Osteopenia 01/11/2016  . CKD (chronic kidney disease) stage 3, GFR 30-59 ml/min (HCC) 01/02/2016  . Hand paresthesia 07/18/2015  . Carpal tunnel syndrome 06/07/2015  . Family history of colon cancer   .  Diabetes mellitus with neurological manifestations ([place]) 04/18/2015  . Bilateral leg edema 04/11/2015  . Abnormality of gait 01/03/2015  . Hereditary and idiopathic peripheral neuropathy 01/03/2015  . GERD (gastroesophageal reflux disease) 06/17/2014  . Hx of colonic polyps 12/15/2012  . Vitamin B12 deficiency 11/03/2012  . Intrinsic asthma 03/23/2012  . Chronic diastolic heart failure ([place]) 02/20/2011  . OSA (obstructive sleep apnea) 09/17/2010  . URINARY URGENCY 01/08/2010  . CAD, NATIVE VESSEL 11/20/2008  . Osteoarthritis of knees, bilateral 06/14/2008  . Hyperlipidemia 05/10/2007  . Essential hypertension 01/18/2007  . Hypokalemia 04/30/2006  . HX, PERSONAL, PEPTIC ULCER DISEASE 04/30/2006    Orientation RESPIRATION BLADDER Height & Weight     Self,Time,Situation,Place  O2 Incontinent Weight: (!) 174.6 kg Height:  5\' 4"  (162.6 cm)  BEHAVIORAL SYMPTOMS/MOOD NEUROLOGICAL BOWEL NUTRITION STATUS      Continent Diet  AMBULATORY STATUS COMMUNICATION OF NEEDS Skin   Extensive Assist Verbally                         Personal Care Assistance Level of Assistance  Bathing,Dressing,Feeding Bathing Assistance: Maximum assistance Feeding assistance: Limited assistance Dressing Assistance: Maximum assistance     Functional Limitations Info  Sight,Hearing,Speech Sight Info: Adequate Hearing Info: Adequate Speech Info: Adequate    SPECIAL CARE FACTORS FREQUENCY  PT (By licensed PT),OT (By licensed OT)     PT Frequency: PT at SNF to eval and treat a minimum of 5 times a week OT Frequency: OT at SNF to eval and treat a minimum of  5 times a week            Contractures Contractures Info: Not present    Additional Factors Info  Code Status,Allergies Code Status Info: Full Allergies Info: pcn; shellfish; sulfa; hctz; estrogen; latex;metronidazole           Current Medications (05/10/2020):  This is the current hospital active medication list Current  Facility-Administered Medications  Medication Dose Route Frequency Provider Last Rate Last Admin  . albuterol (PROVENTIL) (2.5 MG/3ML) 0.083% nebulizer solution 2.5 mg  2.5 mg Nebulization Q6H PRN Tamala Julian, Rondell A, MD      . allopurinol (ZYLOPRIM) tablet 100 mg  100 mg Oral Daily Tamala Julian, Rondell A, MD   100 mg at 05/10/20 0823  . arformoterol (BROVANA) nebulizer solution 15 mcg  15 mcg Nebulization BID Fuller Plan A, MD   15 mcg at 05/10/20 0834  . budesonide (PULMICORT) nebulizer solution 0.5 mg  0.5 mg Nebulization BID Fuller Plan A, MD   0.5 mg at 05/10/20 0834  . carvedilol (COREG) tablet 12.5 mg  12.5 mg Oral BID WC Smith, Rondell A, MD   12.5 mg at 05/10/20 0823  . clindamycin (CLEOCIN) IVPB 600 mg  600 mg Intravenous Q8H Smith, Rondell A, MD 100 mL/hr at 05/10/20 1138 600 mg at 05/10/20 1138  . cyanocobalamin ((VITAMIN B-12)) injection 1,000 mcg  1,000 mcg Intramuscular Q30 days Fuller Plan A, MD   1,000 mcg at 05/08/20 1948  . enoxaparin (LOVENOX) 100 mg/mL injection 170 mg  1 mg/kg Subcutaneous Q12H Smith, Rondell A, MD   170 mg at 05/10/20 1138  . famotidine (PEPCID) tablet 20 mg  20 mg Oral Daily Fuller Plan A, MD   20 mg at 05/10/20 0823  . ferrous sulfate tablet 325 mg  325 mg Oral Q M,W,F Fuller Plan A, MD   325 mg at 05/10/20 0823  . folic acid (FOLVITE) tablet 0.5 mg  500 mcg Oral QHS Smith, Rondell A, MD   0.5 mg at 05/09/20 2125  . furosemide (LASIX) injection 80 mg  80 mg Intravenous BID Lyda Jester M, PA-C   80 mg at 05/10/20 8676  . gabapentin (NEURONTIN) capsule 100 mg  100 mg Oral TID Fuller Plan A, MD   100 mg at 05/10/20 1950  . hydrOXYzine (ATARAX/VISTARIL) tablet 25 mg  25 mg Oral TID PRN Norval Morton, MD   25 mg at 05/09/20 2131  . montelukast (SINGULAIR) tablet 10 mg  10 mg Oral QHS Fuller Plan A, MD   10 mg at 05/09/20 2126  . ondansetron (ZOFRAN) tablet 4 mg  4 mg Oral Q6H PRN Fuller Plan A, MD       Or  . ondansetron (ZOFRAN)  injection 4 mg  4 mg Intravenous Q6H PRN Tamala Julian, Rondell A, MD      . Oxcarbazepine (TRILEPTAL) tablet 300 mg  300 mg Oral BID Fuller Plan A, MD   300 mg at 05/10/20 0823  . oxyCODONE (Oxy IR/ROXICODONE) immediate release tablet 5 mg  5 mg Oral Q6H PRN Fuller Plan A, MD   5 mg at 05/10/20 0827  . potassium chloride SA (KLOR-CON) CR tablet 20 mEq  20 mEq Oral Daily Fuller Plan A, MD   20 mEq at 05/10/20 0823  . potassium chloride SA (KLOR-CON) CR tablet 40 mEq  40 mEq Oral Q4H Katherine Roan, MD   40 mEq at 05/10/20 1137  . saccharomyces boulardii (FLORASTOR) capsule 250 mg  250 mg Oral BID Fuller Plan  A, MD   250 mg at 05/10/20 0823  . senna-docusate (Senokot-S) tablet 1 tablet  1 tablet Oral QHS PRN Fuller Plan A, MD      . simvastatin (ZOCOR) tablet 40 mg  40 mg Oral QHS Smith, Rondell A, MD   40 mg at 05/09/20 2125  . sodium chloride flush (NS) 0.9 % injection 3 mL  3 mL Intravenous Q12H Smith, Rondell A, MD   3 mL at 05/10/20 0824  . spironolactone (ALDACTONE) tablet 25 mg  25 mg Oral Daily Fuller Plan A, MD   25 mg at 05/10/20 2429     Discharge Medications: Please see discharge summary for a list of discharge medications.  Relevant Imaging Results:  Relevant Lab Results:   Additional Information [id number]; fully vaccinated; wt 174.6 kg  Bartholomew Crews, RN

## 2020-05-10 NOTE — Evaluation (Signed)
Physical Therapy Evaluation Patient Details Name: April [name] MRN: [id number] DOB: [birth year] Today's Date: 05/10/2020   History of Present Illness  Patient is a 70 year old female history of HTN, HLD, CAD, CHF, asthma, CKD stage IIIb, T2DM, anemia, morbid obesity, cellulitis presented with shortness of breath, leg pain with progressively worsening shortness of breath x 10 days. Patient diagnosed with bilateral is indeterminate DVT of  tibial veins, with possible superimposed cellulitis and acute hypoxic respiratory failure  Clinical Impression  Pt presents to PT with decrease in mobility from her baseline. Pt is home alone when family is working and is able to transfer from bed to bsc to recliner on her own. Currently she is requiring 2 person min assist to get OOB. Pt was in a SNF for one year and has been home several months. Pt is hesitant to return to a SNF but doesn't feel she can manage at home at this level. Will recommend ST-SNF at this time. If pt remains in hospital several days and makes good progress she may be able to go home.     Follow Up Recommendations SNF    Equipment Recommendations  None recommended by PT    Recommendations for Other Services       Precautions / Restrictions Precautions Precautions: Fall Precaution Comments: denies any falls, monitor O2      Mobility  Bed Mobility Overal bed mobility: Needs Assistance Bed Mobility: Supine to Sit     Supine to sit: Min assist;+2 for physical assistance;HOB elevated     General bed mobility comments: Assist to bring RLE off of bed, elevate trunk into sitting and bring hips to EOB    Transfers Overall transfer level: Needs assistance Equipment used: Rolling walker (2 wheeled) Transfers: Sit to/from Stand Sit to Stand: Min assist;From elevated surface;+2 physical assistance         General transfer comment: Used bed pad under hips to bring hips up. Incr time and effort to  rise  Ambulation/Gait Ambulation/Gait assistance: Min assist;+2 safety/equipment Gait Distance (Feet): 4 Feet Assistive device: Rolling walker (2 wheeled) Gait Pattern/deviations: Decreased step length - right;Decreased step length - left;Shuffle;Trunk flexed;Wide base of support Gait velocity: decr Gait velocity interpretation: <1.31 ft/sec, indicative of household ambulator General Gait Details: Assist for support and safety. Brought w/c closely behind pt.  Stairs            Wheelchair Mobility    Modified Rankin (Stroke Patients Only)       Balance Overall balance assessment: Needs assistance Sitting-balance support: Feet supported Sitting balance-Leahy Scale: Fair     Standing balance support: Bilateral upper extremity supported Standing balance-Leahy Scale: Poor Standing balance comment: heavy support of UE's on walker and min guard for static standing.                             Pertinent Vitals/Pain Pain Assessment: Faces Faces Pain Scale: Hurts whole lot Pain Location: bilateral legs Pain Descriptors / Indicators: Aching;Sore;Moaning Pain Intervention(s): Limited activity within patient's tolerance    Home Living Family/patient expects to be discharged to:: Private residence Living Arrangements: Spouse/significant other;Children (son) Available Help at Discharge: Family;Available PRN/intermittently (family works) Type of Home: House Home Access: [place]: Two level;Able to live on main level with bedroom/bathroom Home Equipment: Bedside commode;Other (comment);Wheelchair - manual;Cane - single point;Walker - 2 wheels;Walker - 4 wheels (put bedside commode over toilet. also has potty  chair in the den) Additional Comments: has bariatric bed in [employer]    Prior Function Level of Independence: Needs assistance   Gait / Transfers Assistance Needed: when family isn't home patient stays in the den and takes 2-3 steps  from recliner to bedside commode. when family is home with try and walk more with walker.  ADL's / Homemaking Assistance Needed: spouse assists with sponge bathing, getting legs in/out of bed, spouse will assist with LB dressing as needed. patient can get herself to bedside commode during the day.  Comments: no home O2     Hand Dominance   Dominant Hand: Right    Extremity/Trunk Assessment   Upper Extremity Assessment Upper Extremity Assessment: Defer to OT evaluation    Lower Extremity Assessment Lower Extremity Assessment: RLE deficits/detail;LLE deficits/detail RLE Deficits / Details: Strength limited by pain. ROM limited by body habitus LLE Deficits / Details: Strength limited by pain. ROM limited by body habitus    Cervical / Trunk Assessment Cervical / Trunk Assessment: Other exceptions Cervical / Trunk Exceptions: body habitus  Communication   Communication: No difficulties  Cognition Arousal/Alertness: Awake/alert Behavior During Therapy: WFL for tasks assessed/performed Overall Cognitive Status: Within Functional Limits for tasks assessed                                        General Comments General comments (skin integrity, edema, etc.): SpO2 86% on RA    Exercises     Assessment/Plan    PT Assessment Patient needs continued PT services  PT Problem List Decreased strength;Decreased range of motion;Decreased activity tolerance;Decreased balance;Decreased mobility;Obesity       PT Treatment Interventions DME instruction;Gait training;Functional mobility training;Therapeutic activities;Therapeutic exercise;Balance training;Patient/family education    PT Goals (Current goals can be found in the Care Plan section)  Acute Rehab PT Goals Patient Stated Goal: Return home PT Goal Formulation: With patient Time For Goal Achievement: 05/24/20 Potential to Achieve Goals: Good    Frequency Min 3X/week   Barriers to discharge Decreased  caregiver support family works and pt alone during the day    Co-evaluation               AM-PAC PT "6 Clicks" Mobility  Outcome Measure Help needed turning from your back to your side while in a flat bed without using bedrails?: A Little Help needed moving from lying on your back to sitting on the side of a flat bed without using bedrails?: Total Help needed moving to and from a bed to a chair (including a wheelchair)?: Total Help needed standing up from a chair using your arms (e.g., wheelchair or bedside chair)?: Total Help needed to walk in hospital room?: Total Help needed climbing 3-5 steps with a railing? : Total 6 Click Score: 8    End of Session Equipment Utilized During Treatment: Oxygen Activity Tolerance: Patient limited by fatigue Patient left: in chair;with call bell/phone within reach (pt in her personal wheelchair) Nurse Communication: Mobility status PT Visit Diagnosis: Other abnormalities of gait and mobility (R26.89);Pain Pain - Right/Left:  (bilateral) Pain - part of body: Leg    Time: 1010-1037 PT Time Calculation (min) (ACUTE ONLY): 27 min   Charges:   PT Evaluation $PT Eval Moderate Complexity: 1 Mod PT Treatments $Therapeutic Activity: 8-22 mins        [place] Pager [phone] Office [place]  Viona Gilmore [facility] 05/10/2020, 2:49 PM

## 2020-05-11 ENCOUNTER — Inpatient Hospital Stay (HOSPITAL_COMMUNITY): Payer: Medicare PPO

## 2020-05-11 DIAGNOSIS — I5033 Acute on chronic diastolic (congestive) heart failure: Secondary | ICD-10-CM

## 2020-05-11 DIAGNOSIS — I82403 Acute embolism and thrombosis of unspecified deep veins of lower extremity, bilateral: Secondary | ICD-10-CM | POA: Diagnosis not present

## 2020-05-11 DIAGNOSIS — G4733 Obstructive sleep apnea (adult) (pediatric): Secondary | ICD-10-CM | POA: Diagnosis not present

## 2020-05-11 LAB — CBC
HCT: 37.6 % (ref 36.0–46.0)
Hemoglobin: 11.5 g/dL — ABNORMAL LOW (ref 12.0–15.0)
MCH: 30.3 pg (ref 26.0–34.0)
MCHC: 30.6 g/dL (ref 30.0–36.0)
MCV: 98.9 fL (ref 80.0–100.0)
Platelets: 142 10*3/uL — ABNORMAL LOW (ref 150–400)
RBC: 3.8 MIL/uL — ABNORMAL LOW (ref 3.87–5.11)
RDW: 14.6 % (ref 11.5–15.5)
WBC: 5.1 10*3/uL (ref 4.0–10.5)
nRBC: 0 % (ref 0.0–0.2)

## 2020-05-11 LAB — BASIC METABOLIC PANEL
Anion gap: 10 (ref 5–15)
BUN: 25 mg/dL — ABNORMAL HIGH (ref 8–23)
CO2: 33 mmol/L — ABNORMAL HIGH (ref 22–32)
Calcium: 8.9 mg/dL (ref 8.9–10.3)
Chloride: 94 mmol/L — ABNORMAL LOW (ref 98–111)
Creatinine, Ser: 1.75 mg/dL — ABNORMAL HIGH (ref 0.44–1.00)
GFR, Estimated: 31 mL/min — ABNORMAL LOW (ref >60)
Glucose, Bld: 98 mg/dL (ref 70–99)
Potassium: 4.1 mmol/L (ref 3.5–5.1)
Sodium: 137 mmol/L (ref 135–145)

## 2020-05-11 NOTE — Progress Notes (Signed)
PROGRESS NOTE    April [name]  SWF:[id number] DOB: September 19, 1950 DOA: 05/07/2020 PCP: [name], MD   Chief Complaint  Patient presents with  . Shortness of Breath  Brief Narrative: 70 year old female with history of HTN, HLD, CAD, CHF, asthma, CKD stage IIIb, T2DM, anemia, morbid obesity, history of cellulitis presented with shortness of breath, leg pain with progressively worsening shortness of breath, difficulty 10 days, walk to the bathroom.  Was having intermittent wheezing and mild productive cough with yellow sputum x1 week.  Baseline echocardiogram.,  But mostly sedentary, has gained 30 pound weight since January. In the ED, patient was seen to be afebrile, blood pressure 100/55-140/72, and O2 saturations noted to be as low as 85% currently maintained on 2 L of oxygen.Labs significant for chloride 96, CO2 34, BUN 29, and creatinine 1.48.Angiogram of the chest with contrast was obtained showing no clear signs of PE,mild cardiomegaly, pulmonary artery hypertension, small hiatal hernia, and likely atelectasis.Influenza and COVID-19 screening were negative.  Patient had been started on clindamycin IV.UKG:URK2 69 pH 7.3, PO2 112. Patient was also found to have is indeterminate bilateral lower extremity DVT and volume overload. Seen by cardiology and being diuresed with IV Lasix.  Subjective: Has no new complaints, some shortness of breath is still present and some chest discomfort.  Getting breathing treatment.   Right elbow is bruise + mild worsening Remains afebrile.  Seen and examined this morning. Afebrile overnight, on 2 L nasal cannula.  Assessment & Plan:  Bilateral is indeterminate DVT of  Tibial veins,  with possible superimposed cellulitis Chronic lymphedema: Patient w/ worsening bilateral leg swelling and pain, has chronic lymphedema and on exam suspected to have cellulitis in the setting of fluid overload and duplex bilateral age-indeterminate DVT.  Continue  Lovenox-transition to [facility] tomorrow or so. Continue IV Lasix per cardiology.TSH stable, has no leukocytosis or fever.   Right upper extremity bruise/swelling - ? From trauma, ?2/2 lovenox anticoagulation. follow-up ultrasound soft tissue showed no acute finding, but thetender firm swelling in the middle of the bruise appears to be slightly worse today I will get CT of the right upper extremity without contrast.  She is on empiric clindamycin and overall afebrile no leukocytosis. Recent Labs  Lab 05/07/20 1754 05/09/20 0434 05/10/20 0844 05/11/20 0347  WBC 4.9 4.6 4.6 5.1   Acute hypoxic respiratory failure multifactorial with acute on chronic diastolic CHF, obesity hypoventilation/OSA and secondary pulmonary hypertension. CT angio did not show acute PE.PCCM Dr. Erskine Emery has reviewed the case and plans for outpatient follow-up sleep apnea evaluation.  Will need to continue with oxygen, will arrange for home oxygen, continue diuresis, wean oxygen as tolerated.    Asthma without exacerbation continue Pulmicort, nebs.  Continue supplemental oxygen  Acute on Chronice diastolic congestive heart failure although normal BNP 60.9 in the setting of obesity.  Grossly fluid overloaded on presentation, diuresed very well with IV Lasix negative almost 5 L.  Continue diuresis as per cardiology, continue Aldactone Coreg, monitor intake output and daily weight Net IO Since Admission: -4,937 mL [05/11/20 1128]  Filed Weights   05/08/20 1227 05/10/20 0411 05/11/20 0424  Weight: (!) 172.4 kg (!) 174.6 kg (!) 171.3 kg   Mildly elevated troponin suspect demand ischemia due to hypoxia. Trop 22> 15- normalized. HTN: BP stable one Coreg 12.5 mg twice daily and Aldactone 25 mg daily HLD: Cont simvastatin 40 mg  Chronic arthritis of the knee, Dr. [name] input appreciated.  Patient will follow up outpatient for  a steroid injection.  GERD on PPI  CKD stage IIIb: Overall stable monitor closely while on diuresis.   Recent Labs    03/08/20 1347 05/07/20 1754 05/09/20 0434 05/10/20 0844 05/11/20 0347  BUN 34* 29* 23 20 25*  CREATININE 1.70* 1.48* 1.60* 1.62* 1.75*   T2DM, last A1c 6.4.  Blood sugar well controlled continue diet control Recent Labs  Lab 05/07/20 1858 05/08/20 2151  GLUCAP 102* 107*   Chronic anemia: Hemoglobin stable monitor.  In the setting of chronic disease. Recent Labs  Lab 05/07/20 1754 05/08/20 0937 05/09/20 0434 05/10/20 0844 05/11/20 0347  HGB 12.6 12.6 10.8* 11.0* 11.5*  HCT 41.3 37.0 35.5* 36.3 37.6   Morbid obesity BMI 65 , ABG suggests OSA/versus with secondary pulmonary hypertension Dr. Erskine Emery is planning to have expedited sleep study and outpatient follow-up.  Will benefit with outpatient follow-up and weight loss.  Diet Order            Diet heart healthy/carb modified Room service appropriate? Yes; Fluid consistency: Thin  Diet effective now                Patient's Body mass index is 64.82 kg/m. DVT prophylaxis: lovenox Code Status:   Code Status: Full Code  Family Communication: plan of care discussed with patient at bedside.  Patient self updating.  Status is: Inpatient Remains inpatient appropriate because:Inpatient level of care appropriate due to severity of illness  Dispo: The patient is from: Home              Anticipated d/c is to:  SNF in 2-3 days              Patient currently is not medically stable to d/c.   Difficult to place patient No   Unresulted Labs (From admission, onward)          Start     Ordered   05/11/20 9509  Basic metabolic panel  Daily,   R     Question:  Specimen collection method  Answer:  Lab=Lab collect   05/10/20 0735   05/11/20 0500  CBC  Daily,   R     Question:  Specimen collection method  Answer:  Lab=Lab collect   05/10/20 0735   05/09/20 0500  CBC  Every 72 hours,   R      05/08/20 1233          Medications reviewed:  Scheduled Meds: . allopurinol  100 mg Oral Daily  . arformoterol   15 mcg Nebulization BID  . budesonide (PULMICORT) nebulizer solution  0.5 mg Nebulization BID  . carvedilol  12.5 mg Oral BID WC  . cyanocobalamin  1,000 mcg Intramuscular Q30 days  . enoxaparin (LOVENOX) injection  1 mg/kg Subcutaneous Q12H  . famotidine  20 mg Oral Daily  . ferrous sulfate  325 mg Oral Q M,W,F  . folic acid  326 mcg Oral QHS  . furosemide  80 mg Intravenous BID  . gabapentin  100 mg Oral TID  . montelukast  10 mg Oral QHS  . Oxcarbazepine  300 mg Oral BID  . potassium chloride  20 mEq Oral Daily  . saccharomyces boulardii  250 mg Oral BID  . simvastatin  40 mg Oral QHS  . sodium chloride flush  3 mL Intravenous Q12H  . spironolactone  25 mg Oral Daily   Continuous Infusions: . clindamycin (CLEOCIN) IV 600 mg (05/11/20 0323)    Consultants: Cardiology  Procedures:see note  Antimicrobials:  Anti-infectives (From admission, onward)   Start     Dose/Rate Route Frequency Ordered Stop   05/08/20 1100  clindamycin (CLEOCIN) IVPB 600 mg        600 mg 100 mL/hr over 30 Minutes Intravenous Every 8 hours 05/08/20 0913     05/08/20 0215  clindamycin (CLEOCIN) IVPB 600 mg        600 mg 100 mL/hr over 30 Minutes Intravenous  Once 05/08/20 0211 05/08/20 0407     Culture/Microbiology    Component Value Date/Time   SDES URINE, RANDOM 02/15/2019 1815   SPECREQUEST  02/15/2019 1815    NONE Performed at [facility], [place] [address], [place], [place] [address]    CULT (A) 02/15/2019 1815    80,000 COLONIES/mL CITROBACTER FREUNDII 80,000 COLONIES/mL PSEUDOMONAS AERUGINOSA    REPTSTATUS 02/17/2019 FINAL 02/15/2019 1815    Other culture-see note  Objective: Vitals: Today's Vitals   05/11/20 0635 05/11/20 0700 05/11/20 0826 05/11/20 0932  BP:  134/62  (!) 118/57  Pulse:  76  75  Resp:  16  18  Temp:  98.9 F (37.2 C)  98.2 F (36.8 C)  TempSrc:  Oral  Oral  SpO2:  95% 96% 96%  Weight:      Height:      PainSc: Asleep 10-Worst pain ever       Intake/Output Summary (Last 24 hours) at 05/11/2020 1128 Last data filed at 05/11/2020 1112 Gross per 24 hour  Intake 950 ml  Output 4000 ml  Net -3050 ml   Filed Weights   05/08/20 1227 05/10/20 0411 05/11/20 0424  Weight: (!) 172.4 kg (!) 174.6 kg (!) 171.3 kg   Weight change: -3.3 kg  Intake/Output from previous day: 04/08 0701 - 04/09 0700 In: 8341 [P.O.:1140; IV Piggyback:50] Out: 2700 [Urine:2700] Intake/Output this shift: Total I/O In: 240 [P.O.:240] Out: 1600 [Urine:1600] Filed Weights   05/08/20 1227 05/10/20 0411 05/11/20 0424  Weight: (!) 172.4 kg (!) 174.6 kg (!) 171.3 kg    Examination: General exam: AAOx3, obese,, NAD, weak appearing. HEENT:Oral mucosa moist, Ear/Nose WNL grossly, dentition normal. Respiratory system: bilaterally diminished,no wheezing or crackles,no use of accessory muscle Cardiovascular system: S1 & S2 +, No JVD,. Gastrointestinal system: Abdomen soft, NT,ND, BS+ Nervous System:Alert, awake, moving extremities and grossly nonfocal Extremities: No edema, distal peripheral pulses palpable.  Skin: No rashes,no icterus.  Bruise on the right elbow area with small firm tender swelling in the middle MSK: Normal muscle bulk,tone, power  Data Reviewed: I have personally reviewed following labs and imaging studies CBC: Recent Labs  Lab 05/07/20 1754 05/08/20 0937 05/09/20 0434 05/10/20 0844 05/11/20 0347  WBC 4.9  --  4.6 4.6 5.1  NEUTROABS 3.3  --   --   --   --   HGB 12.6 12.6 10.8* 11.0* 11.5*  HCT 41.3 37.0 35.5* 36.3 37.6  MCV 98.8  --  100.3* 99.7 98.9  PLT 155  --  148* 131* 962*   Basic Metabolic Panel: Recent Labs  Lab 05/07/20 1754 05/08/20 0937 05/09/20 0434 05/10/20 0844 05/11/20 0347  NA 138 139 137 139 137  K 4.3 4.1 3.9 4.0 4.1  CL 96*  --  98 99 94*  CO2 34*  --  34* 32 33*  GLUCOSE 108*  --  109* 164* 98  BUN 29*  --  23 20 25*  CREATININE 1.48*  --  1.60* 1.62* 1.75*  CALCIUM 9.3  --  8.5* 8.5* 8.9  GFR: Estimated Creatinine Clearance: 48.5 mL/min (A) (by C-G formula based on SCr of 1.75 mg/dL (H)). Liver Function Tests: Recent Labs  Lab 05/09/20 0434  AST 17  ALT 16  ALKPHOS 73  BILITOT 0.9  PROT 5.7*  ALBUMIN 3.0*   No results for input(s): LIPASE, AMYLASE in the last 168 hours. No results for input(s): AMMONIA in the last 168 hours. Coagulation Profile: No results for input(s): INR, PROTIME in the last 168 hours. Cardiac Enzymes: No results for input(s): CKTOTAL, CKMB, CKMBINDEX, TROPONINI in the last 168 hours. BNP (last 3 results) No results for input(s): PROBNP in the last 8760 hours. HbA1C: No results for input(s): HGBA1C in the last 72 hours. CBG: Recent Labs  Lab 05/07/20 1858 05/08/20 2151  GLUCAP 102* 107*   Lipid Profile: No results for input(s): CHOL, HDL, LDLCALC, TRIG, CHOLHDL, LDLDIRECT in the last 72 hours. Thyroid Function Tests: No results for input(s): TSH, T4TOTAL, FREET4, T3FREE, THYROIDAB in the last 72 hours. Anemia Panel: No results for input(s): VITAMINB12, FOLATE, FERRITIN, TIBC, IRON, RETICCTPCT in the last 72 hours. Sepsis Labs: No results for input(s): PROCALCITON, LATICACIDVEN in the last 168 hours.  Recent Results (from the past 240 hour(s))  Resp Panel by RT-PCR (Flu A&B, Covid) Nasopharyngeal Swab     Status: None   Collection Time: 05/07/20 11:56 PM   Specimen: Nasopharyngeal Swab; Nasopharyngeal(NP) swabs in vial transport medium  Result Value Ref Range Status   SARS Coronavirus 2 by RT PCR NEGATIVE NEGATIVE Final    Comment: (NOTE) SARS-CoV-2 target nucleic acids are NOT DETECTED.  The SARS-CoV-2 RNA is generally detectable in upper respiratory specimens during the acute phase of infection. The lowest concentration of SARS-CoV-2 viral copies this assay can detect is 138 copies/mL. A negative result does not preclude SARS-Cov-2 infection and should not be used as the sole basis for treatment or other patient management  decisions. A negative result may occur with  improper specimen collection/handling, submission of specimen other than nasopharyngeal swab, presence of viral mutation(s) within the areas targeted by this assay, and inadequate number of viral copies(<138 copies/mL). A negative result must be combined with clinical observations, patient history, and epidemiological information. The expected result is Negative.  Fact Sheet for Patients:  EntrepreneurPulse.com.au  Fact Sheet for Healthcare Providers:  IncredibleEmployment.be  This test is no t yet approved or cleared by the [place] FDA and  has been authorized for detection and/or diagnosis of SARS-CoV-2 by FDA under an Emergency Use Authorization (EUA). This EUA will remain  in effect (meaning this test can be used) for the duration of the COVID-19 declaration under Section 564(b)(1) of the Act, 21 U.S.C.section 360bbb-3(b)(1), unless the authorization is terminated  or revoked sooner.       Influenza A by PCR NEGATIVE NEGATIVE Final   Influenza B by PCR NEGATIVE NEGATIVE Final    Comment: (NOTE) The Xpert Xpress SARS-CoV-2/FLU/RSV plus assay is intended as an aid in the diagnosis of influenza from Nasopharyngeal swab specimens and should not be used as a sole basis for treatment. Nasal washings and aspirates are unacceptable for Xpert Xpress SARS-CoV-2/FLU/RSV testing.  Fact Sheet for Patients: EntrepreneurPulse.com.au  Fact Sheet for Healthcare Providers: IncredibleEmployment.be  This test is not yet approved or cleared by the [place] FDA and has been authorized for detection and/or diagnosis of SARS-CoV-2 by FDA under an Emergency Use Authorization (EUA). This EUA will remain in effect (meaning this test can be used) for the duration of the COVID-19 declaration  under Section 564(b)(1) of the Act, 21 U.S.C. section 360bbb-3(b)(1), unless the  authorization is terminated or revoked.  Performed at [facility], [place] [address], [place], [place] [address]      Radiology Studies: ECHOCARDIOGRAM COMPLETE  Result Date: 05/10/2020    ECHOCARDIOGRAM REPORT   Patient Name:   Candiss PAGE Alcalde Date of Exam: 05/10/2020 Medical Rec #:  [id number]         Height:       64.0 in Accession #:    [id number]        Weight:       384.9 lb Date of Birth:  1950/03/28          BSA:          2.584 m Patient Age:    31 years          BP:           95/80 mmHg Patient Gender: F                 HR:           65 bpm. Exam Location:  Inpatient Procedure: 2D Echo, Cardiac Doppler and Color Doppler Indications:    CHF  History:        Patient has prior history of Echocardiogram examinations, most                 recent 04/13/2015. CHF, CAD, Signs/Symptoms:Shortness of Breath;                 Risk Factors:Diabetes and Dyslipidemia.  Sonographer:    [place] Referring Phys: [id number] [place]  1. Left ventricular ejection fraction, by estimation, is 60 to 65%. The left ventricle has normal function. The left ventricle has no regional wall motion abnormalities. There is mild left ventricular hypertrophy. Left ventricular diastolic parameters are consistent with Grade II diastolic dysfunction (pseudonormalization). Elevated left atrial pressure.  2. Right ventricular systolic function is normal. The right ventricular size is mildly enlarged. There is severely elevated pulmonary artery systolic pressure. The estimated right ventricular systolic pressure is 78.4 mmHg.  3. The mitral valve is normal in structure. No evidence of mitral valve regurgitation.  4. The aortic valve is tricuspid. Aortic valve regurgitation is not visualized. Mild aortic valve sclerosis is present, with no evidence of aortic valve stenosis.  5. The inferior vena cava is dilated in size with <50% respiratory variability, suggesting right atrial pressure of 15 mmHg. FINDINGS  Left  Ventricle: Left ventricular ejection fraction, by estimation, is 60 to 65%. The left ventricle has normal function. The left ventricle has no regional wall motion abnormalities. The left ventricular internal cavity size was normal in size. There is  mild left ventricular hypertrophy. Left ventricular diastolic parameters are consistent with Grade II diastolic dysfunction (pseudonormalization). Elevated left atrial pressure. Right Ventricle: The right ventricular size is mildly enlarged. Right vetricular wall thickness was not well visualized. Right ventricular systolic function is normal. There is severely elevated pulmonary artery systolic pressure. The tricuspid regurgitant velocity is 3.58 m/s, and with an assumed right atrial pressure of 15 mmHg, the estimated right ventricular systolic pressure is 69.6 mmHg. Left Atrium: Left atrial size was normal in size. Right Atrium: Right atrial size was not well visualized. Pericardium: There is no evidence of pericardial effusion. Mitral Valve: The mitral valve is normal in structure. No evidence of mitral valve regurgitation. Tricuspid Valve: The tricuspid valve is normal in structure. Tricuspid  valve regurgitation is trivial. Aortic Valve: The aortic valve is tricuspid. Aortic valve regurgitation is not visualized. Mild aortic valve sclerosis is present, with no evidence of aortic valve stenosis. Aortic valve mean gradient measures 5.0 mmHg. Aortic valve peak gradient measures 8.9 mmHg. Aortic valve area, by VTI measures 1.40 cm. Pulmonic Valve: The pulmonic valve was not well visualized. Pulmonic valve regurgitation is not visualized. Aorta: The aortic root and ascending aorta are structurally normal, with no evidence of dilitation. Venous: The inferior vena cava is dilated in size with less than 50% respiratory variability, suggesting right atrial pressure of 15 mmHg. IAS/Shunts: The interatrial septum was not well visualized.  LEFT VENTRICLE PLAX 2D LVIDd:          4.50 cm     Diastology LVIDs:         2.40 cm     LV e' medial:    4.95 cm/s LV PW:         0.80 cm     LV E/e' medial:  21.8 LV IVS:        1.00 cm     LV e' lateral:   7.14 cm/s LVOT diam:     1.60 cm     LV E/e' lateral: 15.1 LV SV:         50 LV SV Index:   19 LVOT Area:     2.01 cm  LV Volumes (MOD) LV vol d, MOD A4C: 66.9 ml LV vol s, MOD A4C: 14.6 ml LV SV MOD A4C:     66.9 ml RIGHT VENTRICLE RV S prime:     11.80 cm/s  PULMONARY VEINS TAPSE (M-mode): 2.1 cm      A Reversal Duration: 87.00 msec                             A Reversal Velocity: 26.60 cm/s                             Diastolic Velocity:  36.14 cm/s                             S/D Velocity:        1.40                             Systolic Velocity:   43.15 cm/s LEFT ATRIUM           Index LA diam:      3.30 cm 1.28 cm/m LA Vol (A4C): 73.6 ml 28.48 ml/m  AORTIC VALVE                    PULMONIC VALVE AV Area (Vmax):    1.39 cm     PV Vmax:       1.04 m/s AV Area (Vmean):   1.40 cm     PV Vmean:      77.600 cm/s AV Area (VTI):     1.40 cm     PV VTI:        0.244 m AV Vmax:           149.00 cm/s  PV Peak grad:  4.3 mmHg AV Vmean:          110.000 cm/s PV Mean grad:  3.0 mmHg AV VTI:  0.358 m AV Peak Grad:      8.9 mmHg AV Mean Grad:      5.0 mmHg LVOT Vmax:         103.00 cm/s LVOT Vmean:        76.400 cm/s LVOT VTI:          0.250 m LVOT/AV VTI ratio: 0.70  AORTA Ao Root diam: 2.70 cm Ao Asc diam:  3.40 cm MITRAL VALVE                TRICUSPID VALVE MV Area (PHT): 2.87 cm     TR Peak grad:   51.3 mmHg MV Decel Time: 264 msec     TR Vmax:        358.00 cm/s MV E velocity: 108.00 cm/s MV A velocity: 96.70 cm/s   SHUNTS MV E/A ratio:  1.12         Systemic VTI:  0.25 m                             Systemic Diam: 1.60 cm Oswaldo Milian MD Electronically signed by Oswaldo Milian MD Signature Date/Time: 05/10/2020/7:29:58 PM    Final    [place] RT UPPER EXTREM LTD SOFT TISSUE NON VASCULAR  Result Date: 05/10/2020 CLINICAL DATA:   Right upper arm cellulitis EXAM: ULTRASOUND right UPPER EXTREMITY LIMITED TECHNIQUE: Ultrasound examination of the upper extremity soft tissues was performed in the area of clinical concern. COMPARISON:  None. FINDINGS: Today's sonogram of the area of concern along the back of the right upper arm demonstrates no soft tissue mass, underlying cyst, or appreciable abscess in the visualized soft tissues. IMPRESSION: 1. No abscess, cystic lesion, or soft tissue mass is sonographically apparent in the region of concern. Electronically Signed   By: Van Clines M.D.   On: 05/10/2020 09:50     LOS: 3 days   Antonieta Pert, MD [facility]  05/11/2020, 11:28 AM

## 2020-05-11 NOTE — Progress Notes (Signed)
Progress Note  Patient Name: April [name] Date of Encounter: 05/11/2020  [facility] Cardiologist:   Bensimhon  Subjective   70 yo with hx of acute on chronic diastolic cfh, obesity hypoventilation syndrome, asthma   Has diuresed 4 liters so far during this hospitalization  Creatinine is up slightly to 1.75.  K remains stable at 4.1  Body mass index is 64.82 kg/m.    Inpatient Medications    Scheduled Meds: . allopurinol  100 mg Oral Daily  . arformoterol  15 mcg Nebulization BID  . budesonide (PULMICORT) nebulizer solution  0.5 mg Nebulization BID  . carvedilol  12.5 mg Oral BID WC  . cyanocobalamin  1,000 mcg Intramuscular Q30 days  . enoxaparin (LOVENOX) injection  1 mg/kg Subcutaneous Q12H  . famotidine  20 mg Oral Daily  . ferrous sulfate  325 mg Oral Q M,W,F  . folic acid  149 mcg Oral QHS  . furosemide  80 mg Intravenous BID  . gabapentin  100 mg Oral TID  . montelukast  10 mg Oral QHS  . Oxcarbazepine  300 mg Oral BID  . potassium chloride  20 mEq Oral Daily  . saccharomyces boulardii  250 mg Oral BID  . simvastatin  40 mg Oral QHS  . sodium chloride flush  3 mL Intravenous Q12H  . spironolactone  25 mg Oral Daily   Continuous Infusions: . clindamycin (CLEOCIN) IV 600 mg (05/11/20 0323)   PRN Meds: albuterol, hydrOXYzine, ondansetron **OR** ondansetron (ZOFRAN) IV, oxyCODONE, senna-docusate   Vital Signs    Vitals:   05/11/20 0424 05/11/20 0700 05/11/20 0826 05/11/20 0932  BP: (!) 103/31 134/62  (!) 118/57  Pulse: 71 76  75  Resp: 16 16  18   Temp: 98.5 F (36.9 C) 98.9 F (37.2 C)  98.2 F (36.8 C)  TempSrc: Oral Oral  Oral  SpO2: 97% 95% 96% 96%  Weight: (!) 171.3 kg     Height:        Intake/Output Summary (Last 24 hours) at 05/11/2020 1034 Last data filed at 05/11/2020 0940 Gross per 24 hour  Intake 950 ml  Output 3000 ml  Net -2050 ml   Last 3 Weights 05/11/2020 05/10/2020 05/08/2020  Weight (lbs) 377 lb 10.4 oz 384 lb 14.8 oz 380 lb   Weight (kg) 171.3 kg 174.6 kg 172.367 kg      Telemetry    NSR - Personally Reviewed  ECG     - Personally Reviewed  Physical Exam   GEN: morbidly obese female,  NAD , appears to be weak and deconditioned.    Neck: mild JVD  Cardiac: RRR, no murmurs, rubs, or gallops.  Respiratory: clear anteriorly  GI: Soft, nontender, non-distended   MS:   Extremely large legs.  Tender to palpitation .  No edema  Neuro:  Nonfocal  Psych: Normal affect   Labs    High Sensitivity Troponin:   Recent Labs  Lab 05/07/20 1754 05/07/20 2045 05/08/20 0927  TROPONINIHS 12 22* 15      Chemistry Recent Labs  Lab 05/09/20 0434 05/10/20 0844 05/11/20 0347  NA 137 139 137  K 3.9 4.0 4.1  CL 98 99 94*  CO2 34* 32 33*  GLUCOSE 109* 164* 98  BUN 23 20 25*  CREATININE 1.60* 1.62* 1.75*  CALCIUM 8.5* 8.5* 8.9  PROT 5.7*  --   --   ALBUMIN 3.0*  --   --   AST 17  --   --  ALT 16  --   --   ALKPHOS 73  --   --   BILITOT 0.9  --   --   GFRNONAA 35* 34* 31*  ANIONGAP 5 8 10      Hematology Recent Labs  Lab 05/09/20 0434 05/10/20 0844 05/11/20 0347  WBC 4.6 4.6 5.1  RBC 3.54* 3.64* 3.80*  HGB 10.8* 11.0* 11.5*  HCT 35.5* 36.3 37.6  MCV 100.3* 99.7 98.9  MCH 30.5 30.2 30.3  MCHC 30.4 30.3 30.6  RDW 14.7 14.9 14.6  PLT 148* 131* 142*    BNP Recent Labs  Lab 05/07/20 1754  BNP 60.9     DDimer No results for input(s): DDIMER in the last 168 hours.   Radiology    ECHOCARDIOGRAM COMPLETE  Result Date: 05/10/2020    ECHOCARDIOGRAM REPORT   Patient Name:   April [name] Date of Exam: 05/10/2020 Medical Rec #:  [id number]         Height:       64.0 in Accession #:    [id number]        Weight:       384.9 lb Date of Birth:  1950/12/05          BSA:          2.584 m Patient Age:    78 years          BP:           95/80 mmHg Patient Gender: F                 HR:           65 bpm. Exam Location:  Inpatient Procedure: 2D Echo, Cardiac Doppler and Color Doppler Indications:    CHF   History:        Patient has prior history of Echocardiogram examinations, most                 recent 04/13/2015. CHF, CAD, Signs/Symptoms:Shortness of Breath;                 Risk Factors:Diabetes and Dyslipidemia.  Sonographer:    Spencer Referring Phys: [id number] [place]  1. Left ventricular ejection fraction, by estimation, is 60 to 65%. The left ventricle has normal function. The left ventricle has no regional wall motion abnormalities. There is mild left ventricular hypertrophy. Left ventricular diastolic parameters are consistent with Grade II diastolic dysfunction (pseudonormalization). Elevated left atrial pressure.  2. Right ventricular systolic function is normal. The right ventricular size is mildly enlarged. There is severely elevated pulmonary artery systolic pressure. The estimated right ventricular systolic pressure is 83.3 mmHg.  3. The mitral valve is normal in structure. No evidence of mitral valve regurgitation.  4. The aortic valve is tricuspid. Aortic valve regurgitation is not visualized. Mild aortic valve sclerosis is present, with no evidence of aortic valve stenosis.  5. The inferior vena cava is dilated in size with <50% respiratory variability, suggesting right atrial pressure of 15 mmHg. FINDINGS  Left Ventricle: Left ventricular ejection fraction, by estimation, is 60 to 65%. The left ventricle has normal function. The left ventricle has no regional wall motion abnormalities. The left ventricular internal cavity size was normal in size. There is  mild left ventricular hypertrophy. Left ventricular diastolic parameters are consistent with Grade II diastolic dysfunction (pseudonormalization). Elevated left atrial pressure. Right Ventricle: The right ventricular size is mildly enlarged. Right vetricular wall thickness was not well visualized.  Right ventricular systolic function is normal. There is severely elevated pulmonary artery systolic pressure. The  tricuspid regurgitant velocity is 3.58 m/s, and with an assumed right atrial pressure of 15 mmHg, the estimated right ventricular systolic pressure is 12.4 mmHg. Left Atrium: Left atrial size was normal in size. Right Atrium: Right atrial size was not well visualized. Pericardium: There is no evidence of pericardial effusion. Mitral Valve: The mitral valve is normal in structure. No evidence of mitral valve regurgitation. Tricuspid Valve: The tricuspid valve is normal in structure. Tricuspid valve regurgitation is trivial. Aortic Valve: The aortic valve is tricuspid. Aortic valve regurgitation is not visualized. Mild aortic valve sclerosis is present, with no evidence of aortic valve stenosis. Aortic valve mean gradient measures 5.0 mmHg. Aortic valve peak gradient measures 8.9 mmHg. Aortic valve area, by VTI measures 1.40 cm. Pulmonic Valve: The pulmonic valve was not well visualized. Pulmonic valve regurgitation is not visualized. Aorta: The aortic root and ascending aorta are structurally normal, with no evidence of dilitation. Venous: The inferior vena cava is dilated in size with less than 50% respiratory variability, suggesting right atrial pressure of 15 mmHg. IAS/Shunts: The interatrial septum was not well visualized.  LEFT VENTRICLE PLAX 2D LVIDd:         4.50 cm     Diastology LVIDs:         2.40 cm     LV e' medial:    4.95 cm/s LV PW:         0.80 cm     LV E/e' medial:  21.8 LV IVS:        1.00 cm     LV e' lateral:   7.14 cm/s LVOT diam:     1.60 cm     LV E/e' lateral: 15.1 LV SV:         50 LV SV Index:   19 LVOT Area:     2.01 cm  LV Volumes (MOD) LV vol d, MOD A4C: 66.9 ml LV vol s, MOD A4C: 14.6 ml LV SV MOD A4C:     66.9 ml RIGHT VENTRICLE RV S prime:     11.80 cm/s  PULMONARY VEINS TAPSE (M-mode): 2.1 cm      A Reversal Duration: 87.00 msec                             A Reversal Velocity: 26.60 cm/s                             Diastolic Velocity:  58.09 cm/s                             S/D  Velocity:        1.40                             Systolic Velocity:   98.33 cm/s LEFT ATRIUM           Index LA diam:      3.30 cm 1.28 cm/m LA Vol (A4C): 73.6 ml 28.48 ml/m  AORTIC VALVE                    PULMONIC VALVE AV Area (Vmax):    1.39 cm     PV Vmax:  1.04 m/s AV Area (Vmean):   1.40 cm     PV Vmean:      77.600 cm/s AV Area (VTI):     1.40 cm     PV VTI:        0.244 m AV Vmax:           149.00 cm/s  PV Peak grad:  4.3 mmHg AV Vmean:          110.000 cm/s PV Mean grad:  3.0 mmHg AV VTI:            0.358 m AV Peak Grad:      8.9 mmHg AV Mean Grad:      5.0 mmHg LVOT Vmax:         103.00 cm/s LVOT Vmean:        76.400 cm/s LVOT VTI:          0.250 m LVOT/AV VTI ratio: 0.70  AORTA Ao Root diam: 2.70 cm Ao Asc diam:  3.40 cm MITRAL VALVE                TRICUSPID VALVE MV Area (PHT): 2.87 cm     TR Peak grad:   51.3 mmHg MV Decel Time: 264 msec     TR Vmax:        358.00 cm/s MV E velocity: 108.00 cm/s MV A velocity: 96.70 cm/s   SHUNTS MV E/A ratio:  1.12         Systemic VTI:  0.25 m                             Systemic Diam: 1.60 cm Oswaldo Milian MD Electronically signed by Oswaldo Milian MD Signature Date/Time: 05/10/2020/7:29:58 PM    Final    [place] RT UPPER EXTREM LTD SOFT TISSUE NON VASCULAR  Result Date: 05/10/2020 CLINICAL DATA:  Right upper arm cellulitis EXAM: ULTRASOUND right UPPER EXTREMITY LIMITED TECHNIQUE: Ultrasound examination of the upper extremity soft tissues was performed in the area of clinical concern. COMPARISON:  None. FINDINGS: Today's sonogram of the area of concern along the back of the right upper arm demonstrates no soft tissue mass, underlying cyst, or appreciable abscess in the visualized soft tissues. IMPRESSION: 1. No abscess, cystic lesion, or soft tissue mass is sonographically apparent in the region of concern. Electronically Signed   By: Van Clines M.D.   On: 05/10/2020 09:50    Cardiac Studies      Patient Profile     70 y.o.  female with morbid obesity   Assessment & Plan    1.   Respiratory failure:   Likely multifactorial.  Has obesity hypoventilation,  Acute on chronic diastolic CHF Has diuresed 4 liters. Says she is not breathing any better.  I suspect much of this is due to her morbid obesity   Cont current meds.    2.  Bilateral DVT:  Cont lovenox.  .  Can likely transition to [place].   Will defer to primary team        For questions or updates, please contact [place] Please consult [email or web address] for contact info under        Signed, Mertie Moores, MD  05/11/2020, 10:34 AM

## 2020-05-12 LAB — BASIC METABOLIC PANEL
Anion gap: 8 (ref 5–15)
BUN: 25 mg/dL — ABNORMAL HIGH (ref 8–23)
CO2: 39 mmol/L — ABNORMAL HIGH (ref 22–32)
Calcium: 8.6 mg/dL — ABNORMAL LOW (ref 8.9–10.3)
Chloride: 90 mmol/L — ABNORMAL LOW (ref 98–111)
Creatinine, Ser: 1.55 mg/dL — ABNORMAL HIGH (ref 0.44–1.00)
GFR, Estimated: 36 mL/min — ABNORMAL LOW (ref >60)
Glucose, Bld: 132 mg/dL — ABNORMAL HIGH (ref 70–99)
Potassium: 3.8 mmol/L (ref 3.5–5.1)
Sodium: 137 mmol/L (ref 135–145)

## 2020-05-12 LAB — CBC
HCT: 35.8 % — ABNORMAL LOW (ref 36.0–46.0)
Hemoglobin: 10.9 g/dL — ABNORMAL LOW (ref 12.0–15.0)
MCH: 30.7 pg (ref 26.0–34.0)
MCHC: 30.4 g/dL (ref 30.0–36.0)
MCV: 100.8 fL — ABNORMAL HIGH (ref 80.0–100.0)
Platelets: 129 10*3/uL — ABNORMAL LOW (ref 150–400)
RBC: 3.55 MIL/uL — ABNORMAL LOW (ref 3.87–5.11)
RDW: 14.4 % (ref 11.5–15.5)
WBC: 4.1 10*3/uL (ref 4.0–10.5)
nRBC: 0 % (ref 0.0–0.2)

## 2020-05-12 MED ORDER — APIXABAN 5 MG PO TABS
10.0000 mg | ORAL_TABLET | Freq: Two times a day (BID) | ORAL | Status: DC
  Administered 2020-05-12 – 2020-05-15 (×8): 10 mg via ORAL
  Filled 2020-05-12 (×8): qty 2

## 2020-05-12 MED ORDER — APIXABAN 5 MG PO TABS: 5.0000 mg | ORAL_TABLET | Freq: Two times a day (BID) | ORAL | Status: DC

## 2020-05-12 MED ORDER — DOXYCYCLINE HYCLATE 100 MG PO TABS
100.0000 mg | ORAL_TABLET | Freq: Two times a day (BID) | ORAL | Status: AC
  Administered 2020-05-12 – 2020-05-14 (×6): 100 mg via ORAL
  Filled 2020-05-12 (×6): qty 1

## 2020-05-12 NOTE — Plan of Care (Signed)
  Problem: Education: Goal: Knowledge of General Education information will improve Description Including pain rating scale, medication(s)/side effects and non-pharmacologic comfort measures Outcome: Progressing   Problem: Health Behavior/Discharge Planning: Goal: Ability to manage health-related needs will improve Outcome: Progressing   

## 2020-05-12 NOTE — Progress Notes (Signed)
Pt up to the chair and eating lunch. Pt used walker and one assist.    Paulla Fore, RN, BSN

## 2020-05-12 NOTE — Progress Notes (Signed)
ANTICOAGULATION CONSULT NOTE - Initial Consult  Pharmacy Consult for apixaban  Indication: DVT  Allergies  Allergen Reactions  . Penicillins Rash and Other (See Comments)    She was told not to take it anymore. Has patient had a PCN reaction causing immediate rash, facial/tongue/throat swelling, SOB or lightheadedness with hypotension: Yes Has patient had a PCN reaction causing severe rash involving mucus membranes or skin necrosis: No Has patient had a PCN reaction that required hospitalization: No Has patient had a PCN reaction occurring within the last 10 years: No If all of the above answers are "NO", then may proceed with Cephalosporin use.'  . Shellfish Allergy Anaphylaxis  . Sulfonamide Derivatives Anaphylaxis and Other (See Comments)  . Hydrochlorothiazide W-Triamterene Other (See Comments)    Hypokalemia  . Lotensin [Benazepril Hcl] Hives  . Dipyridamole Other (See Comments)    Unknown reaction  . Estrogens Other (See Comments)    Unknown reaction  . Hydrochlorothiazide Other (See Comments)    Unknown reaction  . Latex Other (See Comments)    Unknown reaction - stated previously during surgery gloves had to be changed, but unsure if it is still an allergy or not.   . Metronidazole Other (See Comments)    Unknown reaction  . Other Itching    PICKLES  . Spironolactone Other (See Comments)    UNSPECIFIED > "kidney problems"  . Sulfa Antibiotics Other (See Comments)    Unknown reaction  . Torsemide Other (See Comments)    Unknown reaction  . Valsartan Other (See Comments)    Unknown reaction  . April [name] Isothiocyanate] Itching    Patient Measurements: Height: 5\' 4"  (162.6 cm) Weight: (!) 171.3 kg (377 lb 10.4 oz) IBW/kg (Calculated) : 54.7   Vital Signs: Temp: 98.7 F (37.1 C) (04/10 0904) Temp Source: Oral (04/10 0904) BP: 132/68 (04/10 0904) Pulse Rate: 77 (04/10 0904)  Labs: Recent Labs    05/10/20 0844 05/11/20 0347 05/12/20 0541  HGB 11.0*  11.5* 10.9*  HCT 36.3 37.6 35.8*  PLT 131* 142* 129*  CREATININE 1.62* 1.75* 1.55*    Estimated Creatinine Clearance: 54.8 mL/min (A) (by C-G formula based on SCr of 1.55 mg/dL (H)).   Medical History: Past Medical History:  Diagnosis Date  . Anemia   . Asthma   . CAD (coronary artery disease)   . Carpal tunnel syndrome   . Cellulitis of both lower extremities 04/11/2015  . CHF (congestive heart failure) ([place])   . Chronic kidney disease    [facility]- followed by [facility]  . Colon polyp, hyperplastic 2007 & 2012  . Complication of anesthesia 1999   svt with renal calculi surgery, no problems since  . CTS (carpal tunnel syndrome)    bilateral  . Diabetes mellitus   . Eczema   . GERD (gastroesophageal reflux disease)   . History of kidney stones 1999  . Hyperlipidemia   . Hypertension   . Leg ulcer ([place]) 04/24/2015   Right lateral leg No evidence of an infection Monitor closely Keep edema controlled   . Leg ulcer ([place])    right lower  . Meralgia paresthetica    Dr. [name]  . Morbid obesity ([place])   . Morbid obesity ([place])   . Neuropathy    toes and legs  . Osteoarthrosis, unspecified whether generalized or localized, lower leg    knee  . PUD (peptic ulcer disease)   . Shortness of breath dyspnea    with exertion  . Sleep apnea  per progress note 02/25/2018  . Type II or unspecified type diabetes mellitus without mention of complication, not stated as uncontrolled   . Unspecified hereditary and idiopathic peripheral neuropathy   . Urticaria   . Vitamin B12 deficiency   . Wears glasses   . Wound cellulitis    right upper leg, healing well     Assessment: 70 year old female found to have DVT of the right posterior tibial veins. Pharmacy has been consulted to switch from therapeutic lovenox to apixaban. Noted patient age 70, weight 171 kg and Scr -1.55. H/H- 10.9/35.8 Plts -129.     Plan:  Apixaban 10 mg BID X 7 days then Apixaban 5 mg BID  Monitor for s/sx of  bleeding, CBC   Will sign-off consult and monitor peripherally   Jimmy Footman, PharmD, BCPS, BCIDP Infectious Diseases Clinical Pharmacist Phone: [phone] 05/12/2020,9:19 AM

## 2020-05-12 NOTE — Progress Notes (Signed)
PROGRESS NOTE    April [name]  UEA:[id number] DOB: April 24, 1950 DOA: 05/07/2020 PCP: [name], MD   Chief Complaint  Patient presents with  . Shortness of Breath  Brief Narrative: 70 year old female with history of HTN, HLD, CAD, CHF, asthma, CKD stage IIIb, T2DM, anemia, morbid obesity, history of cellulitis presented with shortness of breath, leg pain with progressively worsening shortness of breath, difficulty 10 days, walk to the bathroom.  Was having intermittent wheezing and mild productive cough with yellow sputum x1 week.  Baseline echocardiogram.,  But mostly sedentary, has gained 30 pound weight since January. In the ED, patient was seen to be afebrile, blood pressure 100/55-140/72, and O2 saturations noted to be as low as 85% currently maintained on 2 L of oxygen.Labs significant for chloride 96, CO2 34, BUN 29, and creatinine 1.48.Angiogram of the chest with contrast was obtained showing no clear signs of PE,mild cardiomegaly, pulmonary artery hypertension, small hiatal hernia, and likely atelectasis.Influenza and COVID-19 screening were negative.  Patient had been started on clindamycin IV.YNW:GNF6 69 pH 7.3, PO2 112. Patient was also found to have is indeterminate bilateral lower extremity DVT and volume overload. Seen by cardiology and being diuresed with IV Lasix.  Subjective: Seen and examined this morning. Patient has no new complaints has some pain on the right elbow area at the center of bruise Blood pressure remained stable in 100s, on 2 to nasal cannula, Renal function is stable 1.55  Assessment & Plan:  Bilateral is indeterminate DVT of  Tibial veins,  with possible superimposed cellulitis Chronic lymphedema: Patient w/ worsening bilateral leg swelling and pain, has chronic lymphedema and on exam suspected to have cellulitis in the setting of fluid overload and duplex bilateral age-indeterminate DVT.  Will consult pharmacy to transition to [place].  Continue diuresis  as per cardiology. TSH stable, has no leukocytosis or fever.   Right upper extremity bruise/swelling - ? From trauma, ?2/2 lovenox anticoagulation. follow-up ultrasound soft tissue showed no acute finding, CT of the right humerus no acute finding.  Continue to monitor elevate provide supportive care.  Switch to doxycycline.  Switch to p.o. anticoagulation. Recent Labs  Lab 05/07/20 1754 05/09/20 0434 05/10/20 0844 05/11/20 0347 05/12/20 0541  WBC 4.9 4.6 4.6 5.1 4.1   Acute hypoxic respiratory failure multifactorial with acute on chronic diastolic CHF, obesity hypoventilation/OSA and secondary pulmonary hypertension. CT angio did not show acute PE.PCCM Dr. Erskine Emery has reviewed the case and plans for outpatient follow-up sleep apnea evaluation.  Continue supplemental oxygen and will arrange for home.  Continue diuresis PT OT.   Acute on Chronice diastolic congestive heart failure although normal BNP 60.9 in the setting of obesity.  Grossly fluid overloaded on presentation, and is being diuresed with IV Lasix appreciate cardiology input.  Cont Lasix as per cardiology.  Continue continue Aldactone Coreg, monitor intake output and daily weight, her I/O: Net IO Since Admission: -7,721 mL [05/12/20 1014]  Filed Weights   05/08/20 1227 05/10/20 0411 05/11/20 0424  Weight: (!) 172.4 kg (!) 174.6 kg (!) 171.3 kg   Mildly elevated troponin suspect demand ischemia due to hypoxia. Trop 22> 15- normalized. HTN: Stable/soft continue Coreg 12.5 mg twice daily and Aldactone 25 mg daily HLD: Cont simvastatin atin 40 mg  Asthma without exacerbation continue Pulmicort, nebs.  Continue supplemental oxygen  Chronic arthritis of the knee, Dr. [name] input appreciated.  Patient will follow up outpatient for a steroid injection.  GERD continue PPI  CKD stage IIIb: Renal function  remains overall stable.  Monitor while on diuresis.   Recent Labs    03/08/20 1347 05/07/20 1754 05/09/20 0434 05/10/20 0844  05/11/20 0347 05/12/20 0541  BUN 34* 29* 23 20 25* 25*  CREATININE 1.70* 1.48* 1.60* 1.62* 1.75* 1.55*   T2DM, last A1c 6.4.  Blood sugar is controlled continue diet modification  Recent Labs  Lab 05/07/20 1858 05/08/20 2151  GLUCAP 102* 107*   Chronic anemia: Appears to stable. In the setting of chronic disease. Recent Labs  Lab 05/08/20 0937 05/09/20 0434 05/10/20 0844 05/11/20 0347 05/12/20 0541  HGB 12.6 10.8* 11.0* 11.5* 10.9*  HCT 37.0 35.5* 36.3 37.6 35.8*   Morbid obesity BMI 65 , ABG suggests OSA/versus with secondary pulmonary hypertension Dr. Erskine Emery is planning to have expedited sleep study and outpatient follow-up.  Will benefit with outpatient follow-up and weight loss.  Diet Order            Diet heart healthy/carb modified Room service appropriate? Yes; Fluid consistency: Thin  Diet effective now                Patient's Body mass index is 64.82 kg/m. DVT prophylaxis: lovenox Code Status:   Code Status: Full Code  Family Communication: plan of care discussed with patient at bedside.  Patient self updating.  Status is: Inpatient Remains inpatient appropriate because:Inpatient level of care appropriate due to severity of illness  Dispo: The patient is from: Home              Anticipated d/c is to:  SNF in 1-2 days              Patient currently is not medically stable to d/c.   Difficult to place patient No   Unresulted Labs (From admission, onward)          Start     Ordered   05/11/20 6948  Basic metabolic panel  Daily,   R     Question:  Specimen collection method  Answer:  Lab=Lab collect   05/10/20 0735   05/11/20 0500  CBC  Daily,   R     Question:  Specimen collection method  Answer:  Lab=Lab collect   05/10/20 0735   05/09/20 0500  CBC  Every 72 hours,   R      05/08/20 1233          Medications reviewed:  Scheduled Meds: . allopurinol  100 mg Oral Daily  . apixaban  10 mg Oral BID   Followed by  . [START ON 05/19/2020]  apixaban  5 mg Oral BID  . arformoterol  15 mcg Nebulization BID  . budesonide (PULMICORT) nebulizer solution  0.5 mg Nebulization BID  . carvedilol  12.5 mg Oral BID WC  . cyanocobalamin  1,000 mcg Intramuscular Q30 days  . doxycycline  100 mg Oral Q12H  . famotidine  20 mg Oral Daily  . ferrous sulfate  325 mg Oral Q M,W,F  . folic acid  546 mcg Oral QHS  . furosemide  80 mg Intravenous BID  . gabapentin  100 mg Oral TID  . montelukast  10 mg Oral QHS  . Oxcarbazepine  300 mg Oral BID  . potassium chloride  20 mEq Oral Daily  . saccharomyces boulardii  250 mg Oral BID  . simvastatin  40 mg Oral QHS  . sodium chloride flush  3 mL Intravenous Q12H  . spironolactone  25 mg Oral Daily   Continuous Infusions:  Consultants: Cardiology  Procedures:see note  Antimicrobials: Anti-infectives (From admission, onward)   Start     Dose/Rate Route Frequency Ordered Stop   05/12/20 1100  doxycycline (VIBRA-TABS) tablet 100 mg        100 mg Oral Every 12 hours 05/12/20 0956 05/15/20 0959   05/08/20 1100  clindamycin (CLEOCIN) IVPB 600 mg  Status:  Discontinued        600 mg 100 mL/hr over 30 Minutes Intravenous Every 8 hours 05/08/20 0913 05/12/20 0956   05/08/20 0215  clindamycin (CLEOCIN) IVPB 600 mg        600 mg 100 mL/hr over 30 Minutes Intravenous  Once 05/08/20 0211 05/08/20 0407     Culture/Microbiology    Component Value Date/Time   SDES URINE, RANDOM 02/15/2019 1815   SPECREQUEST  02/15/2019 1815    NONE Performed at [facility], [place] [address], [place], [place] [address]    CULT (A) 02/15/2019 1815    80,000 COLONIES/mL CITROBACTER FREUNDII 80,000 COLONIES/mL PSEUDOMONAS AERUGINOSA    REPTSTATUS 02/17/2019 FINAL 02/15/2019 1815    Other culture-see note  Objective: Vitals: Today's Vitals   05/12/20 0434 05/12/20 0625 05/12/20 0806 05/12/20 0904  BP: (!) 125/57   132/68  Pulse: 71   77  Resp: 19   18  Temp: 98.1 F (36.7 C)   98.7 F (37.1 C)   TempSrc: Oral   Oral  SpO2: 96%  96% 96%  Weight:      Height:      PainSc:  9       Intake/Output Summary (Last 24 hours) at 05/12/2020 1014 Last data filed at 05/12/2020 0600 Gross per 24 hour  Intake 1116 ml  Output 4900 ml  Net -3784 ml   Filed Weights   05/08/20 1227 05/10/20 0411 05/11/20 0424  Weight: (!) 172.4 kg (!) 174.6 kg (!) 171.3 kg   Weight change:   Intake/Output from previous day: 04/09 0701 - 04/10 0700 In: 1356 [P.O.:1356] Out: 5500 [Urine:5500] Intake/Output this shift: No intake/output data recorded. Filed Weights   05/08/20 1227 05/10/20 0411 05/11/20 0424  Weight: (!) 172.4 kg (!) 174.6 kg (!) 171.3 kg    Examination: General exam: AAOx3, obese , NAD, weak appearing. HEENT:Oral mucosa moist, Ear/Nose WNL grossly, dentition normal. Respiratory system: bilaterally clear,no wheezing or crackles,no use of accessory muscle Cardiovascular system: S1 & S2 +, No JVD,. Gastrointestinal system: Abdomen soft, NT,ND, BS+ Nervous System:Alert, awake, moving extremities and grossly nonfocal Extremities: No edema, distal peripheral pulses palpable.  Skin: No rashes,no icterus. MSK: Normal muscle bulk,tone, power Right elbow medial area of bruise skin discoloration with small firm tender swelling palpable in the center of the bruise.  Data Reviewed: I have personally reviewed following labs and imaging studies CBC: Recent Labs  Lab 05/07/20 1754 05/08/20 0937 05/09/20 0434 05/10/20 0844 05/11/20 0347 05/12/20 0541  WBC 4.9  --  4.6 4.6 5.1 4.1  NEUTROABS 3.3  --   --   --   --   --   HGB 12.6 12.6 10.8* 11.0* 11.5* 10.9*  HCT 41.3 37.0 35.5* 36.3 37.6 35.8*  MCV 98.8  --  100.3* 99.7 98.9 100.8*  PLT 155  --  148* 131* 142* 321*   Basic Metabolic Panel: Recent Labs  Lab 05/07/20 1754 05/08/20 0937 05/09/20 0434 05/10/20 0844 05/11/20 0347 05/12/20 0541  NA 138 139 137 139 137 137  K 4.3 4.1 3.9 4.0 4.1 3.8  CL 96*  --  98  99 94* 90*  CO2  34*  --  34* 32 33* 39*  GLUCOSE 108*  --  109* 164* 98 132*  BUN 29*  --  23 20 25* 25*  CREATININE 1.48*  --  1.60* 1.62* 1.75* 1.55*  CALCIUM 9.3  --  8.5* 8.5* 8.9 8.6*   GFR: Estimated Creatinine Clearance: 54.8 mL/min (A) (by C-G formula based on SCr of 1.55 mg/dL (H)). Liver Function Tests: Recent Labs  Lab 05/09/20 0434  AST 17  ALT 16  ALKPHOS 73  BILITOT 0.9  PROT 5.7*  ALBUMIN 3.0*   No results for input(s): LIPASE, AMYLASE in the last 168 hours. No results for input(s): AMMONIA in the last 168 hours. Coagulation Profile: No results for input(s): INR, PROTIME in the last 168 hours. Cardiac Enzymes: No results for input(s): CKTOTAL, CKMB, CKMBINDEX, TROPONINI in the last 168 hours. BNP (last 3 results) No results for input(s): PROBNP in the last 8760 hours. HbA1C: No results for input(s): HGBA1C in the last 72 hours. CBG: Recent Labs  Lab 05/07/20 1858 05/08/20 2151  GLUCAP 102* 107*   Lipid Profile: No results for input(s): CHOL, HDL, LDLCALC, TRIG, CHOLHDL, LDLDIRECT in the last 72 hours. Thyroid Function Tests: No results for input(s): TSH, T4TOTAL, FREET4, T3FREE, THYROIDAB in the last 72 hours. Anemia Panel: No results for input(s): VITAMINB12, FOLATE, FERRITIN, TIBC, IRON, RETICCTPCT in the last 72 hours. Sepsis Labs: No results for input(s): PROCALCITON, LATICACIDVEN in the last 168 hours.  Recent Results (from the past 240 hour(s))  Resp Panel by RT-PCR (Flu A&B, Covid) Nasopharyngeal Swab     Status: None   Collection Time: 05/07/20 11:56 PM   Specimen: Nasopharyngeal Swab; Nasopharyngeal(NP) swabs in vial transport medium  Result Value Ref Range Status   SARS Coronavirus 2 by RT PCR NEGATIVE NEGATIVE Final    Comment: (NOTE) SARS-CoV-2 target nucleic acids are NOT DETECTED.  The SARS-CoV-2 RNA is generally detectable in upper respiratory specimens during the acute phase of infection. The lowest concentration of SARS-CoV-2 viral copies this  assay can detect is 138 copies/mL. A negative result does not preclude SARS-Cov-2 infection and should not be used as the sole basis for treatment or other patient management decisions. A negative result may occur with  improper specimen collection/handling, submission of specimen other than nasopharyngeal swab, presence of viral mutation(s) within the areas targeted by this assay, and inadequate number of viral copies(<138 copies/mL). A negative result must be combined with clinical observations, patient history, and epidemiological information. The expected result is Negative.  Fact Sheet for Patients:  EntrepreneurPulse.com.au  Fact Sheet for Healthcare Providers:  IncredibleEmployment.be  This test is no t yet approved or cleared by the [place] FDA and  has been authorized for detection and/or diagnosis of SARS-CoV-2 by FDA under an Emergency Use Authorization (EUA). This EUA will remain  in effect (meaning this test can be used) for the duration of the COVID-19 declaration under Section 564(b)(1) of the Act, 21 U.S.C.section 360bbb-3(b)(1), unless the authorization is terminated  or revoked sooner.       Influenza A by PCR NEGATIVE NEGATIVE Final   Influenza B by PCR NEGATIVE NEGATIVE Final    Comment: (NOTE) The Xpert Xpress SARS-CoV-2/FLU/RSV plus assay is intended as an aid in the diagnosis of influenza from Nasopharyngeal swab specimens and should not be used as a sole basis for treatment. Nasal washings and aspirates are unacceptable for Xpert Xpress SARS-CoV-2/FLU/RSV testing.  Fact Sheet for Patients: EntrepreneurPulse.com.au  Fact  Sheet for Healthcare Providers: IncredibleEmployment.be  This test is not yet approved or cleared by the [place] and has been authorized for detection and/or diagnosis of SARS-CoV-2 by FDA under an Emergency Use Authorization (EUA). This EUA will  remain in effect (meaning this test can be used) for the duration of the COVID-19 declaration under Section 564(b)(1) of the Act, 21 U.S.C. section 360bbb-3(b)(1), unless the authorization is terminated or revoked.  Performed at [facility], [place] [address], [place], [place] [address]      Radiology Studies: CT HUMERUS RIGHT WO CONTRAST  Result Date: 05/11/2020 CLINICAL DATA:  Right upper arm bruising. EXAM: CT OF THE RIGHT HUMERUS WITHOUT CONTRAST TECHNIQUE: Multidetector CT imaging was performed according to the standard protocol. Multiplanar CT image reconstructions were also generated. COMPARISON:  None. FINDINGS: Bones/Joint/Cartilage No fracture or dislocation. Normal alignment. No joint effusion. Ligaments Ligaments are suboptimally evaluated by CT. Muscles and Tendons Grossly intact. Soft tissue No fluid collection or hematoma.  No soft tissue mass. IMPRESSION: 1. No acute abnormality. Electronically Signed   By: Titus Dubin M.D.   On: 05/11/2020 16:24   ECHOCARDIOGRAM COMPLETE  Result Date: 05/10/2020    ECHOCARDIOGRAM REPORT   Patient Name:   Chad PAGE Heinicke Date of Exam: 05/10/2020 Medical Rec #:  [id number]         Height:       64.0 in Accession #:    [id number]        Weight:       384.9 lb Date of Birth:  1950-09-24          BSA:          2.584 m Patient Age:    41 years          BP:           95/80 mmHg Patient Gender: F                 HR:           65 bpm. Exam Location:  Inpatient Procedure: 2D Echo, Cardiac Doppler and Color Doppler Indications:    CHF  History:        Patient has prior history of Echocardiogram examinations, most                 recent 04/13/2015. CHF, CAD, Signs/Symptoms:Shortness of Breath;                 Risk Factors:Diabetes and Dyslipidemia.  Sonographer:    [place] Referring Phys: [id number] [place]  1. Left ventricular ejection fraction, by estimation, is 60 to 65%. The left ventricle has normal function. The left  ventricle has no regional wall motion abnormalities. There is mild left ventricular hypertrophy. Left ventricular diastolic parameters are consistent with Grade II diastolic dysfunction (pseudonormalization). Elevated left atrial pressure.  2. Right ventricular systolic function is normal. The right ventricular size is mildly enlarged. There is severely elevated pulmonary artery systolic pressure. The estimated right ventricular systolic pressure is 97.9 mmHg.  3. The mitral valve is normal in structure. No evidence of mitral valve regurgitation.  4. The aortic valve is tricuspid. Aortic valve regurgitation is not visualized. Mild aortic valve sclerosis is present, with no evidence of aortic valve stenosis.  5. The inferior vena cava is dilated in size with <50% respiratory variability, suggesting right atrial pressure of 15 mmHg. FINDINGS  Left Ventricle: Left ventricular ejection fraction, by estimation, is 60 to  65%. The left ventricle has normal function. The left ventricle has no regional wall motion abnormalities. The left ventricular internal cavity size was normal in size. There is  mild left ventricular hypertrophy. Left ventricular diastolic parameters are consistent with Grade II diastolic dysfunction (pseudonormalization). Elevated left atrial pressure. Right Ventricle: The right ventricular size is mildly enlarged. Right vetricular wall thickness was not well visualized. Right ventricular systolic function is normal. There is severely elevated pulmonary artery systolic pressure. The tricuspid regurgitant velocity is 3.58 m/s, and with an assumed right atrial pressure of 15 mmHg, the estimated right ventricular systolic pressure is 18.2 mmHg. Left Atrium: Left atrial size was normal in size. Right Atrium: Right atrial size was not well visualized. Pericardium: There is no evidence of pericardial effusion. Mitral Valve: The mitral valve is normal in structure. No evidence of mitral valve regurgitation.  Tricuspid Valve: The tricuspid valve is normal in structure. Tricuspid valve regurgitation is trivial. Aortic Valve: The aortic valve is tricuspid. Aortic valve regurgitation is not visualized. Mild aortic valve sclerosis is present, with no evidence of aortic valve stenosis. Aortic valve mean gradient measures 5.0 mmHg. Aortic valve peak gradient measures 8.9 mmHg. Aortic valve area, by VTI measures 1.40 cm. Pulmonic Valve: The pulmonic valve was not well visualized. Pulmonic valve regurgitation is not visualized. Aorta: The aortic root and ascending aorta are structurally normal, with no evidence of dilitation. Venous: The inferior vena cava is dilated in size with less than 50% respiratory variability, suggesting right atrial pressure of 15 mmHg. IAS/Shunts: The interatrial septum was not well visualized.  LEFT VENTRICLE PLAX 2D LVIDd:         4.50 cm     Diastology LVIDs:         2.40 cm     LV e' medial:    4.95 cm/s LV PW:         0.80 cm     LV E/e' medial:  21.8 LV IVS:        1.00 cm     LV e' lateral:   7.14 cm/s LVOT diam:     1.60 cm     LV E/e' lateral: 15.1 LV SV:         50 LV SV Index:   19 LVOT Area:     2.01 cm  LV Volumes (MOD) LV vol d, MOD A4C: 66.9 ml LV vol s, MOD A4C: 14.6 ml LV SV MOD A4C:     66.9 ml RIGHT VENTRICLE RV S prime:     11.80 cm/s  PULMONARY VEINS TAPSE (M-mode): 2.1 cm      A Reversal Duration: 87.00 msec                             A Reversal Velocity: 26.60 cm/s                             Diastolic Velocity:  99.37 cm/s                             S/D Velocity:        1.40                             Systolic Velocity:   16.96 cm/s LEFT ATRIUM  Index LA diam:      3.30 cm 1.28 cm/m LA Vol (A4C): 73.6 ml 28.48 ml/m  AORTIC VALVE                    PULMONIC VALVE AV Area (Vmax):    1.39 cm     PV Vmax:       1.04 m/s AV Area (Vmean):   1.40 cm     PV Vmean:      77.600 cm/s AV Area (VTI):     1.40 cm     PV VTI:        0.244 m AV Vmax:           149.00 cm/s  PV  Peak grad:  4.3 mmHg AV Vmean:          110.000 cm/s PV Mean grad:  3.0 mmHg AV VTI:            0.358 m AV Peak Grad:      8.9 mmHg AV Mean Grad:      5.0 mmHg LVOT Vmax:         103.00 cm/s LVOT Vmean:        76.400 cm/s LVOT VTI:          0.250 m LVOT/AV VTI ratio: 0.70  AORTA Ao Root diam: 2.70 cm Ao Asc diam:  3.40 cm MITRAL VALVE                TRICUSPID VALVE MV Area (PHT): 2.87 cm     TR Peak grad:   51.3 mmHg MV Decel Time: 264 msec     TR Vmax:        358.00 cm/s MV E velocity: 108.00 cm/s MV A velocity: 96.70 cm/s   SHUNTS MV E/A ratio:  1.12         Systemic VTI:  0.25 m                             Systemic Diam: 1.60 cm Oswaldo Milian MD Electronically signed by Oswaldo Milian MD Signature Date/Time: 05/10/2020/7:29:58 PM    Final      LOS: 4 days   Antonieta Pert, MD [facility]  05/12/2020, 10:14 AM

## 2020-05-12 NOTE — Discharge Instructions (Signed)
Information on my medicine - ELIQUIS (apixaban)  This medication education was reviewed with me or my healthcare representative as part of my discharge preparation.   Why was Eliquis prescribed for you? Eliquis was prescribed to treat blood clots that may have been found in the veins of your legs (deep vein thrombosis) or in your lungs (pulmonary embolism) and to reduce the risk of them occurring again.  What do You need to know about Eliquis ? The starting dose is 10 mg (two 5 mg tablets) taken TWICE daily for the FIRST SEVEN (7) DAYS, then on 05/19/20 the dose is reduced to ONE 5 mg tablet taken TWICE daily.  Eliquis may be taken with or without food.   Try to take the dose about the same time in the morning and in the evening. If you have difficulty swallowing the tablet whole please discuss with your pharmacist how to take the medication safely.  Take Eliquis exactly as prescribed and DO NOT stop taking Eliquis without talking to the doctor who prescribed the medication.  Stopping may increase your risk of developing a new blood clot.  Refill your prescription before you run out.  After discharge, you should have regular check-up appointments with your healthcare provider that is prescribing your Eliquis.    What do you do if you miss a dose? If a dose of ELIQUIS is not taken at the scheduled time, take it as soon as possible on the same day and twice-daily administration should be resumed. The dose should not be doubled to make up for a missed dose.  Important Safety Information A possible side effect of Eliquis is bleeding. You should call your healthcare provider right away if you experience any of the following: ? Bleeding from an injury or your nose that does not stop. ? Unusual colored urine (red or dark brown) or unusual colored stools (red or black). ? Unusual bruising for unknown reasons. ? A serious fall or if you hit your head (even if there is no bleeding).  Some  medicines may interact with Eliquis and might increase your risk of bleeding or clotting while on Eliquis. To help avoid this, consult your healthcare provider or pharmacist prior to using any new prescription or non-prescription medications, including herbals, vitamins, non-steroidal anti-inflammatory drugs (NSAIDs) and supplements.  This website has more information on Eliquis (apixaban): http://www.eliquis.com/eliquis/home

## 2020-05-13 DIAGNOSIS — I82403 Acute embolism and thrombosis of unspecified deep veins of lower extremity, bilateral: Secondary | ICD-10-CM | POA: Diagnosis not present

## 2020-05-13 LAB — CBC
HCT: 34.6 % — ABNORMAL LOW (ref 36.0–46.0)
Hemoglobin: 10.7 g/dL — ABNORMAL LOW (ref 12.0–15.0)
MCH: 30.1 pg (ref 26.0–34.0)
MCHC: 30.9 g/dL (ref 30.0–36.0)
MCV: 97.2 fL (ref 80.0–100.0)
Platelets: 129 10*3/uL — ABNORMAL LOW (ref 150–400)
RBC: 3.56 MIL/uL — ABNORMAL LOW (ref 3.87–5.11)
RDW: 14.2 % (ref 11.5–15.5)
WBC: 4.3 10*3/uL (ref 4.0–10.5)
nRBC: 0 % (ref 0.0–0.2)

## 2020-05-13 LAB — BASIC METABOLIC PANEL
Anion gap: 6 (ref 5–15)
BUN: 27 mg/dL — ABNORMAL HIGH (ref 8–23)
CO2: 41 mmol/L — ABNORMAL HIGH (ref 22–32)
Calcium: 8.8 mg/dL — ABNORMAL LOW (ref 8.9–10.3)
Chloride: 90 mmol/L — ABNORMAL LOW (ref 98–111)
Creatinine, Ser: 1.56 mg/dL — ABNORMAL HIGH (ref 0.44–1.00)
GFR, Estimated: 36 mL/min — ABNORMAL LOW (ref >60)
Glucose, Bld: 99 mg/dL (ref 70–99)
Potassium: 3.9 mmol/L (ref 3.5–5.1)
Sodium: 137 mmol/L (ref 135–145)

## 2020-05-13 MED ORDER — PROSOURCE PLUS PO LIQD
30.0000 mL | Freq: Two times a day (BID) | ORAL | Status: DC
  Administered 2020-05-13 – 2020-05-15 (×5): 30 mL via ORAL
  Filled 2020-05-13 (×5): qty 30

## 2020-05-13 MED ORDER — POTASSIUM CHLORIDE CRYS ER 20 MEQ PO TBCR
40.0000 meq | EXTENDED_RELEASE_TABLET | Freq: Once | ORAL | Status: AC
  Administered 2020-05-13: 40 meq via ORAL
  Filled 2020-05-13: qty 2

## 2020-05-13 MED ORDER — METOLAZONE 2.5 MG PO TABS
2.5000 mg | ORAL_TABLET | Freq: Once | ORAL | Status: AC
  Administered 2020-05-13: 2.5 mg via ORAL
  Filled 2020-05-13: qty 1

## 2020-05-13 NOTE — Progress Notes (Signed)
Initial Nutrition Assessment  DOCUMENTATION CODES:  Morbid obesity  INTERVENTION:  Add prosource plus BID to provide 100kcal and 15g of protein per serving  Continue current diet as ordered, consider adjusting to carb consistent, 2g if intake does not improve to remove fat restriction  Provided general education on portion sizes and needed amounts of fruits and vegetables daily.    NUTRITION DIAGNOSIS:  Increased nutrient needs related to acute illness as evidenced by estimated needs.  GOAL:  Patient will meet greater than or equal to 90% of their needs  MONITOR:  PO intake,Supplement acceptance,I & O's  REASON FOR ASSESSMENT:  Consult Assessment of nutrition requirement/status  ASSESSMENT:  Pt admitted with worsening SOB and leg swelling over the last few days. Hypoxic while in ED and found to have DVT. PMH relevant for CAD, T2DM, HTN, HLD, vitamin b12 deficiency, CHF, GERD, CKD.  Pt resting in bed at the time of visit. Pt reports that she has been eating well this admission, states appetite is at baseline. Noted 65% average intake x 13 recorded meals. Ranges from 0-100% consumed with 5 refusals noted. Pt reports that at home she normally eats 3 meals a day. Reports that she lives with her husband and son. Husband does most of the meal preparation. Pt reports compliancy with her medications at home and makes a conscious effort to reduce her salt intake. Did have general questions about portion sizes and amounts of fruits/vegetables to consume in a day which were addressed. Pt reports taking a serving of prostat daily at home, requests this be continued. Will add BID to address acute needs from illness. Reports that she ambulates daily with her husband at home but has to take breaks as she gets SOB and low on energy. PT notes that pt will needs short term SNF prior to returning home.  Medications reviewed and include: vitamin b12 monthly injection, ferrous sulfate, folic acid 182XHB  daily, lasix, KCl 26mEq daily, aldactone Labs reviewed: BUN 27, creatinine 1.56. Last A1c 2/4 6.4%  NUTRITION - FOCUSED PHYSICAL EXAM: Flowsheet Row Most Recent Value  Orbital Region No depletion  Upper Arm Region No depletion  Thoracic and Lumbar Region No depletion  Buccal Region No depletion  Temple Region No depletion  Clavicle Bone Region No depletion  Clavicle and Acromion Bone Region No depletion  Scapular Bone Region No depletion  Dorsal Hand No depletion  Patellar Region No depletion  Anterior Thigh Region No depletion  Posterior Calf Region No depletion  Edema (RD Assessment) Severe  [pitting to the legs and RUE]  Hair Reviewed  Eyes Reviewed  Mouth Reviewed  Skin Reviewed  [bruising noted to right elbow area]  Nails Reviewed    Adipose tissue and edema may be masking signs of muscle depletion  Diet Order:   Diet Order            Diet heart healthy/carb modified Room service appropriate? Yes; Fluid consistency: Thin  Diet effective now                EDUCATION NEEDS:  Education needs have been addressed  Skin:  Skin Assessment: Reviewed RN Assessment (Ecchymosis)  Last BM:  4/10 per RN documentation  Height:  Ht Readings from Last 1 Encounters:  05/08/20 5\' 4"  (1.626 m)   Weight:  Wt Readings from Last 1 Encounters:  05/12/20 (!) 169.3 kg    Ideal Body Weight:  54.5 kg  BMI:  Body mass index is 64.07 kg/m.  Estimated Nutritional Needs:  Kcal:  1500-1700 kcal  Protein:  75-85 g  Fluid:  >1520mL/d   Ranell Patrick, RD, LDN Clinical Dietitian Pager on Amion

## 2020-05-13 NOTE — Plan of Care (Signed)
  Problem: Education: Goal: Knowledge of General Education information will improve Description: Including pain rating scale, medication(s)/side effects and non-pharmacologic comfort measures Outcome: Completed/Met   Problem: Health Behavior/Discharge Planning: Goal: Ability to manage health-related needs will improve Outcome: Completed/Met   Problem: Clinical Measurements: Goal: Ability to maintain clinical measurements within normal limits will improve Outcome: Completed/Met Goal: Will remain free from infection Outcome: Completed/Met Goal: Diagnostic test results will improve Outcome: Completed/Met Goal: Respiratory complications will improve Outcome: Completed/Met Goal: Cardiovascular complication will be avoided Outcome: Completed/Met   Problem: Nutrition: Goal: Adequate nutrition will be maintained Outcome: Completed/Met   Problem: Coping: Goal: Level of anxiety will decrease Outcome: Completed/Met   Problem: Elimination: Goal: Will not experience complications related to bowel motility Outcome: Completed/Met Goal: Will not experience complications related to urinary retention Outcome: Completed/Met   Problem: Safety: Goal: Ability to remain free from injury will improve Outcome: Completed/Met   Problem: Skin Integrity: Goal: Risk for impaired skin integrity will decrease Outcome: Completed/Met

## 2020-05-13 NOTE — TOC Progression Note (Signed)
Transition of Care [facility]) - Progression Note    Patient Details  Name: April [name] MRN: [id number] Date of Birth: 12-26-1950  [facility]) CM/SW Contact  Bartholomew Crews, RN Phone Number: [phone] 05/13/2020, 10:36 AM  Clinical Narrative:     Notified by [employer] that insurance authorization has been received. MD notified. Patient not medically stable, but may be ready in the next 1-2 days. Will need covid test. TOC following for transition needs.   Expected Discharge Plan: Skilled Nursing Facility Barriers to Discharge: Continued Medical Work up  Expected Discharge Plan and Services Expected Discharge Plan: [place] In-house Referral: Clinical Social Work,[facility] Discharge Planning Services: CM Consult Post Acute Care Choice: Durable Medical Equipment (hospital bed; bsc; rw; ramp; wheelchair) Living arrangements for the past 2 months: Single Family Home                 DME Arranged: Oxygen DME Agency: [employer] Date DME Agency Contacted: 05/10/20 Time DME Agency Contacted: 1100 Representative spoke with at DME Agency: [place]: RN,PT,OT [address] Agency: [facility] (formerly [employer]) (now known as [employer]) Date [employer]: 05/10/20 Time [place]: 1115 Representative spoke with at [place]: [place]   Social Determinants of Health ([place]) Interventions    Readmission Risk Interventions No flowsheet data found.

## 2020-05-13 NOTE — Progress Notes (Signed)
Physical Therapy Treatment Patient Details Name: April [name] MRN: [id number] DOB: October 12, 1950 Today's Date: 05/13/2020    History of Present Illness Pt is a 70 year old female who presented to ED on 05/07/20 with leg pain with progressively worsening shortness of breath x 10 days. Pt diagnosed with bilateral is indeterminate DVT of  tibial veins, with possible superimposed cellulitis and acute hypoxic respiratory failure. PMH includes HTN, HLD, CAD, CHF, asthma, CKD stage IIIb, T2DM, anemia, morbid obesity, and cellulitis.    PT Comments    Pt received in bed, wanting to get out of bed. Generally mod Ax2 for transfers. Cueing needed for hand placement on RW and to maintain upright posture. Pt needed assistance for steadying in standing and noticeably fatigued following stand pivot. Pt reported dizziness in all position (supine, sitting, standing) that was worse in standing. BP measured following stand pivot transfer and was stable. Pt left in chair with all needs met, call bell within reach, and NT present. Will continue to follow acutely.    Follow Up Recommendations  SNF     Equipment Recommendations  None recommended by PT    Recommendations for Other Services       Precautions / Restrictions Precautions Precautions: Fall Precaution Comments: denies any falls, monitor O2 Restrictions Weight Bearing Restrictions: No    Mobility  Bed Mobility Overal bed mobility: Needs Assistance Bed Mobility: Supine to Sit     Supine to sit: Supervision;HOB elevated     General bed mobility comments: Supervision with increased time and 15 second rest breaks as needed, use of bed rails    Transfers Overall transfer level: Needs assistance Equipment used: Rolling walker (2 wheeled) Transfers: Sit to/from Stand Sit to Stand: +2 physical assistance;Mod assist         General transfer comment: Mod A to power up into standing, reported some dizziness upon coming into sitting that  decreased after sitting at EOB for 1-2 min  Ambulation/Gait             General Gait Details: unsafe to attempt   Stairs             Wheelchair Mobility    Modified Rankin (Stroke Patients Only)       Balance Overall balance assessment: Needs assistance Sitting-balance support: Feet supported Sitting balance-Leahy Scale: Fair     Standing balance support: Bilateral upper extremity supported Standing balance-Leahy Scale: Poor Standing balance comment: heavy support of UE's on walker and external assistance needed                            Cognition Arousal/Alertness: Awake/alert Behavior During Therapy: WFL for tasks assessed/performed Overall Cognitive Status: Within Functional Limits for tasks assessed                                        Exercises      General Comments        Pertinent Vitals/Pain Pain Assessment: Faces Faces Pain Scale: Hurts even more Pain Location: bilateral legs Pain Descriptors / Indicators: Aching;Sore;Moaning;Discomfort Pain Intervention(s): Monitored during session;Repositioned    Home Living                      Prior Function            PT Goals (current goals can now be found  in the care plan section) Acute Rehab PT Goals Patient Stated Goal: Return home PT Goal Formulation: With patient Time For Goal Achievement: 05/24/20 Potential to Achieve Goals: Good    Frequency    Min 3X/week      PT Plan      Co-evaluation              AM-PAC PT "6 Clicks" Mobility   Outcome Measure  Help needed turning from your back to your side while in a flat bed without using bedrails?: A Little Help needed moving from lying on your back to sitting on the side of a flat bed without using bedrails?: A Little Help needed moving to and from a bed to a chair (including a wheelchair)?: Total Help needed standing up from a chair using your arms (e.g., wheelchair or bedside  chair)?: Total Help needed to walk in hospital room?: Total Help needed climbing 3-5 steps with a railing? : Total 6 Click Score: 10    End of Session Equipment Utilized During Treatment: Oxygen Activity Tolerance: Patient limited by fatigue Patient left: in chair;with call bell/phone within reach (pt in her personal wheelchair) Nurse Communication: Mobility status PT Visit Diagnosis: Other abnormalities of gait and mobility (R26.89);Pain Pain - Right/Left:  (bilateral) Pain - part of body: Leg     Time: 1407-1430 PT Time Calculation (min) (ACUTE ONLY): 23 min  Charges:  $Therapeutic Activity: 23-37 mins                     Rosita Kea, SPT

## 2020-05-13 NOTE — Progress Notes (Signed)
Patient with physical therapist at this time. Unable to complete admission assessment.

## 2020-05-13 NOTE — Progress Notes (Addendum)
Advanced Heart Failure Team Consult Note   Primary Physician: [name], MD PCP-Cardiologist:  Dr. Haroldine Laws   Reason for Consultation: Acute on Chronic Diastolic Heart Failure   HPI:    Good diuresis over the weekend.  Net neg about 10L total for the admission.  Reports mild improvement in breathing.     Objective:    Vital Signs:   Temp:  [97.8 F (36.6 C)-99.1 F (37.3 C)] 99.1 F (37.3 C) (04/11 7510) Pulse Rate:  [72-77] 72 (04/11 0638) Resp:  [16-18] 16 (04/11 2585) BP: (113-132)/(47-77) 113/47 (04/11 2778) SpO2:  [92 %-97 %] 92 % (04/11 2423) Weight:  [169.3 kg] 169.3 kg (04/10 2050) Last BM Date: 05/12/20  Weight change: Filed Weights   05/10/20 0411 05/11/20 0424 05/12/20 2050  Weight: (!) 174.6 kg (!) 171.3 kg (!) 169.3 kg    Intake/Output:   Intake/Output Summary (Last 24 hours) at 05/13/2020 0825 Last data filed at 05/13/2020 0600 Gross per 24 hour  Intake 1080 ml  Output 3350 ml  Net -2270 ml      Physical Exam    Cardiac: JVD 9, normal rate and rhythm, distant heart sounds, 1+ bilateral LE edema non pitting Pulmonary: no wheezing, anterior breath sounds clear, not in distress Abdominal: obese abdomen, soft and nontender Psych: Alert, conversant  Telemetry   NSR 70s   EKG    No new ecg  Labs   Basic Metabolic Panel: Recent Labs  Lab 05/09/20 0434 05/10/20 0844 05/11/20 0347 05/12/20 0541 05/13/20 0334  NA 137 139 137 137 137  K 3.9 4.0 4.1 3.8 3.9  CL 98 99 94* 90* 90*  CO2 34* 32 33* 39* 41*  GLUCOSE 109* 164* 98 132* 99  BUN 23 20 25* 25* 27*  CREATININE 1.60* 1.62* 1.75* 1.55* 1.56*  CALCIUM 8.5* 8.5* 8.9 8.6* 8.8*    Liver Function Tests: Recent Labs  Lab 05/09/20 0434  AST 17  ALT 16  ALKPHOS 73  BILITOT 0.9  PROT 5.7*  ALBUMIN 3.0*   No results for input(s): LIPASE, AMYLASE in the last 168 hours. No results for input(s): AMMONIA in the last 168 hours.  CBC: Recent Labs  Lab 05/07/20 1754  05/08/20 0937 05/09/20 0434 05/10/20 0844 05/11/20 0347 05/12/20 0541 05/13/20 0334  WBC 4.9  --  4.6 4.6 5.1 4.1 4.3  NEUTROABS 3.3  --   --   --   --   --   --   HGB 12.6   < > 10.8* 11.0* 11.5* 10.9* 10.7*  HCT 41.3   < > 35.5* 36.3 37.6 35.8* 34.6*  MCV 98.8  --  100.3* 99.7 98.9 100.8* 97.2  PLT 155  --  148* 131* 142* 129* 129*   < > = values in this interval not displayed.    Cardiac Enzymes: No results for input(s): CKTOTAL, CKMB, CKMBINDEX, TROPONINI in the last 168 hours.  BNP: BNP (last 3 results) Recent Labs    10/20/19 1226 05/07/20 1754  BNP 51.9 60.9    ProBNP (last 3 results) No results for input(s): PROBNP in the last 8760 hours.   CBG: Recent Labs  Lab 05/07/20 1858 05/08/20 2151  GLUCAP 102* 107*    Coagulation Studies: No results for input(s): LABPROT, INR in the last 72 hours.   Imaging   No results found.   Medications:     Current Medications: . allopurinol  100 mg Oral Daily  . apixaban  10 mg Oral BID  Followed by  . [START ON 05/19/2020] apixaban  5 mg Oral BID  . arformoterol  15 mcg Nebulization BID  . budesonide (PULMICORT) nebulizer solution  0.5 mg Nebulization BID  . carvedilol  12.5 mg Oral BID WC  . cyanocobalamin  1,000 mcg Intramuscular Q30 days  . doxycycline  100 mg Oral Q12H  . famotidine  20 mg Oral Daily  . ferrous sulfate  325 mg Oral Q M,W,F  . folic acid  751 mcg Oral QHS  . furosemide  80 mg Intravenous BID  . gabapentin  100 mg Oral TID  . montelukast  10 mg Oral QHS  . Oxcarbazepine  300 mg Oral BID  . potassium chloride  20 mEq Oral Daily  . saccharomyces boulardii  250 mg Oral BID  . simvastatin  40 mg Oral QHS  . sodium chloride flush  3 mL Intravenous Q12H  . spironolactone  25 mg Oral Daily    Infusions:    Assessment/Plan   1. Acute Hypoxic Respiratory Failure - O2 sats initially in the 80s in ED, improved w/ Supp O2 - Admit CXR unremarkable. No edema, effusions or infiltrates -  Chest CT negative for PE - BNP normal at 60 but likely falsely low due to obesity. Volume overloaded on exam c/w CHF - multifactorial 2/2 acute asthma exacerbation + OHS + a/c dCHF  2. Bilateral LE DVT  - confirmed by venous dopplers. Chest CT negative for PE - Transitioned to eliquis  3. Acute on Chronic Diastolic Heart Failure - RHC 2008 c/w mild pulmonary hypertension, primarily due to high output state. Her mean pulmonary artery pressure was 27, wedge pressure was 10. PVR was 1.80 [place] units. - Echo 2017 LVEF 65-70%, G1DD, RV normal - Repeat Echo 4/8 EF remains normal at 60-65%, G2DD, IVC dilated with <50% respiratory variability - Volume status difficult to assess but I feel like JVD has improved and she has had mild symptomatic improvement.  Continue IV lasix likely for just one more day then can switch to oral diuretic - Suspect underlying OSA/OHS. Needs sleep study  - continue spiro 25 mg daily  - no SGLT2i given risk of GU infection given morbid obesity/ large panus   4. Acute Asthma Exacerbation  - management per triad  - respiratory status improving w/ treatment   5. LE Cellulitis  - AF. WBC normal - abx per triad   6. CAD:  - LHC in 2008 showed 50-60% LAD lesion and 30% RCA lesion - no ischemic like CP. EKG shows NSR no ST abnormality. Hs trop low level and flat, not c/w ACS - c/w  blocker and statin - On eliquis currently for DVT  7. Stage III CKD - Baseline SCr ~1.5 - 1.5 today, monitor w/ diuresis    Length of Stay: [place], MD  05/13/2020, 8:25 AM  Advanced Heart Failure Team Pager [phone] (M-F; 7a - 5p)  Please contact [facility] for night-coverage after hours (4p -7a ) and weekends on [email or web address]   Patient seen and examined with the above-signed Advanced Practice Provider and/or Housestaff. I personally reviewed laboratory data, imaging studies and relevant notes. I independently examined the patient and formulated the important  aspects of the plan. I have edited the note to reflect any of my changes or salient points. I have personally discussed the plan with the patient and/or family.  Weight down 11 pounds with IV diuresis. Breathing better. Creatinine stable. On Eliquis for DVT  General:  Sitting up No resp difficulty HEENT: normal Neck: supple. Unable to clearly see JVP due to size. Carotids 2+ bilat; no bruits. No lymphadenopathy or thryomegaly appreciated. Cor: PMI nondisplaced. Regular rate & rhythm. No rubs, gallops or murmurs. Lungs: clear Abdomen: obese  soft, nontender, nondistended. No hepatosplenomegaly. No bruits or masses. Good bowel sounds. Extremities: no cyanosis, clubbing, rash, trace edema R>L Neuro: alert & orientedx3, cranial nerves grossly intact. moves all 4 extremities w/o difficulty. Affect pleasant  Volume status improving. Very difficult to assess volume status on exam due to size. Renal function stable. Agree with continuing IV diuresis one more day. Can give 2.5 mg metolazone to potentiate as well.   Glori Bickers, MD  2:31 PM

## 2020-05-13 NOTE — Progress Notes (Signed)
PROGRESS NOTE    April [name]  FFM:[id number] DOB: 05-16-1950 DOA: 05/07/2020 PCP: [name], MD   Chief Complaint  Patient presents with  . Shortness of Breath  Brief Narrative: 70 year old female with history of HTN, HLD, CAD, CHF, asthma, CKD stage IIIb, T2DM, anemia, morbid obesity, history of cellulitis presented with shortness of breath, leg pain with progressively worsening shortness of breath, difficulty 10 days, walk to the bathroom.  Was having intermittent wheezing and mild productive cough with yellow sputum x1 week.  Baseline echocardiogram.,  But mostly sedentary, has gained 30 pound weight since January. In the ED, patient was seen to be afebrile, blood pressure 100/55-140/72, and O2 saturations noted to be as low as 85% currently maintained on 2 L of oxygen.Labs significant for chloride 96, CO2 34, BUN 29, and creatinine 1.48.Angiogram of the chest with contrast was obtained showing no clear signs of PE,mild cardiomegaly, pulmonary artery hypertension, small hiatal hernia, and likely atelectasis.Influenza and COVID-19 screening were negative.  Patient had been started on clindamycin IV.TTS:VXB9 69 pH 7.3, PO2 112. Patient was also found to have is indeterminate bilateral lower extremity DVT and volume overload. Seen by cardiology and being diuresed with IV Lasix.  Anticoagulation transitionED to oral Eliquis 4/10.  Subjective: Seen this morning.  Resting comfortably. Overnight afebrile, renal function stable.  Has had good urine output 3.3 L. And negative 9.9L net Complains of some itching Has a area bruise on the right abdomen at the site of Lovenox looks similar to the bruise on the elbow.  Assessment & Plan:  Bilateral is indeterminate DVT of  Tibial veins,  with possible superimposed cellulitis Chronic lymphedema: Patient w/ worsening bilateral leg swelling and pain, has chronic lymphedema and on exam suspected to have cellulitis in the setting of fluid overload and  duplex bilateral age-indeterminate DVT.  No obvious colitis noted.  Off clindamycin.  Now transition to Eliquis for anticoagulation.  While diuresed with Lasix.  Continue PT OT, awaiting for skilled nursing facility. TSH stable, has no leukocytosis or fever.   Right upper extremity bruise/swelling - ? From trauma, ?2/2 lovenox anticoagulation. follow-up ultrasound soft tissue as well as CT of the right humerus without contrast did not show any acute finding.  Monitor diarrhea, anticoagulation transitioned to Eliquis.  Continue empiric doxycycline.   Recent Labs  Lab 05/09/20 0434 05/10/20 0844 05/11/20 0347 05/12/20 0541 05/13/20 0334  WBC 4.6 4.6 5.1 4.1 4.3   Acute hypoxic respiratory failure multifactorial with acute on chronic diastolic CHF, obesity hypoventilation/OSA and secondary pulmonary hypertension. CT angio did not show acute PE.PCCM Dr. Erskine Emery has reviewed the case and plans for outpatient follow-up sleep apnea evaluation.  Continue supplemental oxygen, continue diuresis, encourage ambulation.    Acute on Chronice diastolic congestive heart failure although normal BNP 60.9 in the setting of obesity.  Grossly fluid overloaded on presentation, and is being diuresed with IV Lasix as per cardiology hopefully switch to oral Lasix tomorrow,appreciate input.  Has had good urine output, weight is improving nicely and total almost 10 L negative.  Continue continue Aldactone Coreg, monitor intake output and daily weight.  Reports her weight was around 350 back in February current weight 370/169 kg after diuresis, her I/O: Net IO Since Admission: -9,991 mL [05/13/20 0757]  Filed Weights   05/10/20 0411 05/11/20 0424 05/12/20 2050  Weight: (!) 174.6 kg (!) 171.3 kg (!) 169.3 kg   Mildly elevated troponin suspect demand ischemia due to hypoxia. Trop 22> 15- normalized. HTN:  BP is slightly soft can hold Coreg for sbp <100 mm hg, cont Aldactone 25 mg daily HLD:  On simvastatin 40 mg  Asthma  without exacerbation continue Pulmicort, nebs.  Continue supplemental oxygen  Chronic arthritis of the knee, Dr. [name] input appreciated.  Patient will follow up outpatient for a steroid injection.  GERD continue PPI  CKD stage IIIb: Renal function remains overall stable and tolerating diuresis well. Recent Labs    03/08/20 1347 05/07/20 1754 05/09/20 0434 05/10/20 0844 05/11/20 0347 05/12/20 0541 05/13/20 0334  BUN 34* 29* 23 20 25* 25* 27*  CREATININE 1.70* 1.48* 1.60* 1.62* 1.75* 1.55* 1.56*   T2DM, last A1c 6.4.  Blood sugar is controlled, continue diet modification  Recent Labs  Lab 05/07/20 1858 05/08/20 2151  GLUCAP 102* 107*   Chronic anemia: HB is stable. In the setting of chronic disease. Recent Labs  Lab 05/09/20 0434 05/10/20 0844 05/11/20 0347 05/12/20 0541 05/13/20 0334  HGB 10.8* 11.0* 11.5* 10.9* 10.7*  HCT 35.5* 36.3 37.6 35.8* 34.6*   Morbid obesity BMI 65 , ABG suggests OSA/versus with secondary pulmonary hypertension Dr. Erskine Emery is planning to have expedited sleep study and outpatient follow-up.  Will benefit with outpatient follow-up and weight loss strategy from primary care..  Diet Order            Diet heart healthy/carb modified Room service appropriate? Yes; Fluid consistency: Thin  Diet effective now                Patient's Body mass index is 64.07 kg/m. DVT prophylaxis: lovenox Code Status:   Code Status: Full Code  Family Communication: plan of care discussed with patient at bedside.  Patient self updating.  Status is: Inpatient Remains inpatient appropriate because:Inpatient level of care appropriate due to severity of illness  Dispo: The patient is from: Home              Anticipated d/c is to:  SNF in 1-2 days once okay with cardiology.  Ordered Covid, has a SNF  authorization as soon as tomorrow              Patient currently is not medically stable to d/c.   Difficult to place patient No   Unresulted Labs (From  admission, onward)          Start     Ordered   05/09/20 0500  CBC  Every 72 hours,   R      05/08/20 1233          Medications reviewed:  Scheduled Meds: . allopurinol  100 mg Oral Daily  . apixaban  10 mg Oral BID   Followed by  . [START ON 05/19/2020] apixaban  5 mg Oral BID  . arformoterol  15 mcg Nebulization BID  . budesonide (PULMICORT) nebulizer solution  0.5 mg Nebulization BID  . carvedilol  12.5 mg Oral BID WC  . cyanocobalamin  1,000 mcg Intramuscular Q30 days  . doxycycline  100 mg Oral Q12H  . famotidine  20 mg Oral Daily  . ferrous sulfate  325 mg Oral Q M,W,F  . folic acid  144 mcg Oral QHS  . furosemide  80 mg Intravenous BID  . gabapentin  100 mg Oral TID  . montelukast  10 mg Oral QHS  . Oxcarbazepine  300 mg Oral BID  . potassium chloride  20 mEq Oral Daily  . saccharomyces boulardii  250 mg Oral BID  . simvastatin  40 mg Oral  QHS  . sodium chloride flush  3 mL Intravenous Q12H  . spironolactone  25 mg Oral Daily   Continuous Infusions:   Consultants: Cardiology  Procedures:see note  Antimicrobials: Anti-infectives (From admission, onward)   Start     Dose/Rate Route Frequency Ordered Stop   05/12/20 1100  doxycycline (VIBRA-TABS) tablet 100 mg        100 mg Oral Every 12 hours 05/12/20 0956 05/15/20 0959   05/08/20 1100  clindamycin (CLEOCIN) IVPB 600 mg  Status:  Discontinued        600 mg 100 mL/hr over 30 Minutes Intravenous Every 8 hours 05/08/20 0913 05/12/20 0956   05/08/20 0215  clindamycin (CLEOCIN) IVPB 600 mg        600 mg 100 mL/hr over 30 Minutes Intravenous  Once 05/08/20 0211 05/08/20 0407     Culture/Microbiology    Component Value Date/Time   SDES URINE, RANDOM 02/15/2019 1815   SPECREQUEST  02/15/2019 1815    NONE Performed at [facility], [place] [address], [place], [place] [address]    CULT (A) 02/15/2019 1815    80,000 COLONIES/mL CITROBACTER FREUNDII 80,000 COLONIES/mL PSEUDOMONAS AERUGINOSA    REPTSTATUS  02/17/2019 FINAL 02/15/2019 1815    Other culture-see note  Objective: Vitals: Today's Vitals   05/12/20 2050 05/12/20 2138 05/13/20 0355 05/13/20 0638  BP: (!) 115/51   (!) 113/47  Pulse: 76   72  Resp: 18   16  Temp: 97.8 F (36.6 C)   99.1 F (37.3 C)  TempSrc: Oral   Oral  SpO2: 96%   92%  Weight: (!) 169.3 kg     Height:      PainSc:  10-Worst pain ever 10-Worst pain ever     Intake/Output Summary (Last 24 hours) at 05/13/2020 0757 Last data filed at 05/13/2020 0600 Gross per 24 hour  Intake 1080 ml  Output 3350 ml  Net -2270 ml   Filed Weights   05/10/20 0411 05/11/20 0424 05/12/20 2050  Weight: (!) 174.6 kg (!) 171.3 kg (!) 169.3 kg   Weight change:   Intake/Output from previous day: 04/10 0701 - 04/11 0700 In: 1080 [P.O.:1080] Out: 3350 [Urine:3350] Intake/Output this shift: No intake/output data recorded. Filed Weights   05/10/20 0411 05/11/20 0424 05/12/20 2050  Weight: (!) 174.6 kg (!) 171.3 kg (!) 169.3 kg    Examination: General exam: AAOx3 , obese, NAD, weak appearing. HEENT:Oral mucosa moist, Ear/Nose WNL grossly, dentition normal. Respiratory system: bilaterally clear,no wheezing or crackles,no use of accessory muscle Cardiovascular system: S1 & S2 +, No JVD,. Gastrointestinal system: Abdomen soft, NT,ND, BS+ Nervous System:Alert, awake, moving extremities and grossly nonfocal Extremities: No edema, distal peripheral pulses palpable.  Skin: No rashes,no icterus. MSK: Normal muscle bulk,tone, power. Right elbow area posteriorly area w/ ? bruise present and small area of palpable tenderness in the middle. Right abdominal area  w/ bruise at the site of Lovenox  Data Reviewed: I have personally reviewed following labs and imaging studies CBC: Recent Labs  Lab 05/07/20 1754 05/08/20 0937 05/09/20 0434 05/10/20 0844 05/11/20 0347 05/12/20 0541 05/13/20 0334  WBC 4.9  --  4.6 4.6 5.1 4.1 4.3  NEUTROABS 3.3  --   --   --   --   --   --   HGB  12.6   < > 10.8* 11.0* 11.5* 10.9* 10.7*  HCT 41.3   < > 35.5* 36.3 37.6 35.8* 34.6*  MCV 98.8  --  100.3* 99.7 98.9  100.8* 97.2  PLT 155  --  148* 131* 142* 129* 129*   < > = values in this interval not displayed.   Basic Metabolic Panel: Recent Labs  Lab 05/09/20 0434 05/10/20 0844 05/11/20 0347 05/12/20 0541 05/13/20 0334  NA 137 139 137 137 137  K 3.9 4.0 4.1 3.8 3.9  CL 98 99 94* 90* 90*  CO2 34* 32 33* 39* 41*  GLUCOSE 109* 164* 98 132* 99  BUN 23 20 25* 25* 27*  CREATININE 1.60* 1.62* 1.75* 1.55* 1.56*  CALCIUM 8.5* 8.5* 8.9 8.6* 8.8*   GFR: Estimated Creatinine Clearance: 54 mL/min (A) (by C-G formula based on SCr of 1.56 mg/dL (H)). Liver Function Tests: Recent Labs  Lab 05/09/20 0434  AST 17  ALT 16  ALKPHOS 73  BILITOT 0.9  PROT 5.7*  ALBUMIN 3.0*   No results for input(s): LIPASE, AMYLASE in the last 168 hours. No results for input(s): AMMONIA in the last 168 hours. Coagulation Profile: No results for input(s): INR, PROTIME in the last 168 hours. Cardiac Enzymes: No results for input(s): CKTOTAL, CKMB, CKMBINDEX, TROPONINI in the last 168 hours. BNP (last 3 results) No results for input(s): PROBNP in the last 8760 hours. HbA1C: No results for input(s): HGBA1C in the last 72 hours. CBG: Recent Labs  Lab 05/07/20 1858 05/08/20 2151  GLUCAP 102* 107*   Lipid Profile: No results for input(s): CHOL, HDL, LDLCALC, TRIG, CHOLHDL, LDLDIRECT in the last 72 hours. Thyroid Function Tests: No results for input(s): TSH, T4TOTAL, FREET4, T3FREE, THYROIDAB in the last 72 hours. Anemia Panel: No results for input(s): VITAMINB12, FOLATE, FERRITIN, TIBC, IRON, RETICCTPCT in the last 72 hours. Sepsis Labs: No results for input(s): PROCALCITON, LATICACIDVEN in the last 168 hours.  Recent Results (from the past 240 hour(s))  Resp Panel by RT-PCR (Flu A&B, Covid) Nasopharyngeal Swab     Status: None   Collection Time: 05/07/20 11:56 PM   Specimen: Nasopharyngeal  Swab; Nasopharyngeal(NP) swabs in vial transport medium  Result Value Ref Range Status   SARS Coronavirus 2 by RT PCR NEGATIVE NEGATIVE Final    Comment: (NOTE) SARS-CoV-2 target nucleic acids are NOT DETECTED.  The SARS-CoV-2 RNA is generally detectable in upper respiratory specimens during the acute phase of infection. The lowest concentration of SARS-CoV-2 viral copies this assay can detect is 138 copies/mL. A negative result does not preclude SARS-Cov-2 infection and should not be used as the sole basis for treatment or other patient management decisions. A negative result may occur with  improper specimen collection/handling, submission of specimen other than nasopharyngeal swab, presence of viral mutation(s) within the areas targeted by this assay, and inadequate number of viral copies(<138 copies/mL). A negative result must be combined with clinical observations, patient history, and epidemiological information. The expected result is Negative.  Fact Sheet for Patients:  EntrepreneurPulse.com.au  Fact Sheet for Healthcare Providers:  IncredibleEmployment.be  This test is no t yet approved or cleared by the [place] FDA and  has been authorized for detection and/or diagnosis of SARS-CoV-2 by FDA under an Emergency Use Authorization (EUA). This EUA will remain  in effect (meaning this test can be used) for the duration of the COVID-19 declaration under Section 564(b)(1) of the Act, 21 U.S.C.section 360bbb-3(b)(1), unless the authorization is terminated  or revoked sooner.       Influenza A by PCR NEGATIVE NEGATIVE Final   Influenza B by PCR NEGATIVE NEGATIVE Final    Comment: (NOTE) The Xpert Xpress SARS-CoV-2/FLU/RSV plus  assay is intended as an aid in the diagnosis of influenza from Nasopharyngeal swab specimens and should not be used as a sole basis for treatment. Nasal washings and aspirates are unacceptable for Xpert Xpress  SARS-CoV-2/FLU/RSV testing.  Fact Sheet for Patients: EntrepreneurPulse.com.au  Fact Sheet for Healthcare Providers: IncredibleEmployment.be  This test is not yet approved or cleared by the [place] FDA and has been authorized for detection and/or diagnosis of SARS-CoV-2 by FDA under an Emergency Use Authorization (EUA). This EUA will remain in effect (meaning this test can be used) for the duration of the COVID-19 declaration under Section 564(b)(1) of the Act, 21 U.S.C. section 360bbb-3(b)(1), unless the authorization is terminated or revoked.  Performed at [facility], [place] [address], [place], [place] [address]      Radiology Studies: CT HUMERUS RIGHT WO CONTRAST  Result Date: 05/11/2020 CLINICAL DATA:  Right upper arm bruising. EXAM: CT OF THE RIGHT HUMERUS WITHOUT CONTRAST TECHNIQUE: Multidetector CT imaging was performed according to the standard protocol. Multiplanar CT image reconstructions were also generated. COMPARISON:  None. FINDINGS: Bones/Joint/Cartilage No fracture or dislocation. Normal alignment. No joint effusion. Ligaments Ligaments are suboptimally evaluated by CT. Muscles and Tendons Grossly intact. Soft tissue No fluid collection or hematoma.  No soft tissue mass. IMPRESSION: 1. No acute abnormality. Electronically Signed   By: Titus Dubin M.D.   On: 05/11/2020 16:24     LOS: 5 days   Antonieta Pert, MD [facility]  05/13/2020, 7:57 AM

## 2020-05-13 NOTE — Telephone Encounter (Signed)
Patient scheduled with Dr. Halford Chessman on 5/2 -pr

## 2020-05-14 DIAGNOSIS — I82403 Acute embolism and thrombosis of unspecified deep veins of lower extremity, bilateral: Secondary | ICD-10-CM | POA: Diagnosis not present

## 2020-05-14 LAB — SARS CORONAVIRUS 2 (TAT 6-24 HRS): SARS Coronavirus 2: NEGATIVE

## 2020-05-14 MED ORDER — TORSEMIDE 20 MG PO TABS
60.0000 mg | ORAL_TABLET | Freq: Two times a day (BID) | ORAL | Status: DC
  Administered 2020-05-14 – 2020-05-15 (×2): 60 mg via ORAL
  Filled 2020-05-14 (×4): qty 3

## 2020-05-14 MED ORDER — FUROSEMIDE 10 MG/ML IJ SOLN
80.0000 mg | Freq: Once | INTRAMUSCULAR | Status: AC
  Administered 2020-05-14: 80 mg via INTRAVENOUS
  Filled 2020-05-14: qty 8

## 2020-05-14 NOTE — Progress Notes (Signed)
PROGRESS NOTE    April [name]  IPJ:[id number] DOB: 24-Apr-1950 DOA: 05/07/2020 PCP: [name], MD   Chief Complaint  Patient presents with  . Shortness of Breath  Brief Narrative: 70 year old female with history of HTN, HLD, CAD, CHF, asthma, CKD stage IIIb, T2DM, anemia, morbid obesity, history of cellulitis presented with shortness of breath, leg pain with progressively worsening shortness of breath, difficulty 10 days, walk to the bathroom.  Was having intermittent wheezing and mild productive cough with yellow sputum x1 week.  Baseline echocardiogram.,  But mostly sedentary, has gained 30 pound weight since January. In the ED, patient was seen to be afebrile, blood pressure 100/55-140/72, and O2 saturations noted to be as low as 85% currently maintained on 2 L of oxygen.Labs significant for chloride 96, CO2 34, BUN 29, and creatinine 1.48.Angiogram of the chest with contrast was obtained showing no clear signs of PE,mild cardiomegaly, pulmonary artery hypertension, small hiatal hernia, and likely atelectasis.Influenza and COVID-19 screening were negative.  Patient had been started on clindamycin IV.BHA:LPF7 69 pH 7.3, PO2 112. Patient was also found to have is indeterminate bilateral lower extremity DVT and volume overload. Seen by cardiology and being diuresed with IV Lasix.  Anticoagulation transitionED to oral Eliquis 4/10. She has been diuresed well with slowly improving respiratory status.  Subjective: Some shortness of breath this morning reports was placed with 3 l [place] Does not feel ready for SNF yet today.   Some pain on the right elbow area where she has a bruise. Also has bruise on the right abdomen.  Overall afebrile.  Assessment & Plan:  Bilateral is indeterminate DVT of  Tibial veins,  with possible superimposed cellulitis Chronic lymphedema: Patient w/ worsening bilateral leg swelling and pain, has chronic lymphedema and on exam suspected to have cellulitis in the  setting of fluid overload and duplex bilateral age-indeterminate DVT.  Continue Eliquis for anticoagulation.  Afebrile and swelling is much improved.  Status post Lasix IV x1 today with plan to transition to torsemide tomorrow.  Continue PT OT she is awaiting for  SNF and has been accepted  Right upper extremity bruise/swelling - ? From trauma, ?2/2 lovenox anticoagulation. ultrasound soft tissue as well as CT of the right humerus without contrast -did not show any acute finding.  Has a bruise on the right elbow area with small tender firm swelling in the center suspect this is hematoma-no major increase in size of the home swelling, continue to monitor as patient is on Eliquis. Cont empiric doxycycline.   Recent Labs  Lab 05/09/20 0434 05/10/20 0844 05/11/20 0347 05/12/20 0541 05/13/20 0334  WBC 4.6 4.6 5.1 4.1 4.3   Acute hypoxic respiratory failure:multifactorial with acute on chronic diastolic CHF, obesity hypoventilation/OSA and secondary pulmonary hypertension. CT angio did not show acute PE.PCCM Dr. Erskine Emery has reviewed the case and plans for outpatient follow-up sleep apnea evaluation.  Continue supplemental oxygen 2-3 LC. Cont diuresis, encourage ambulation.    Acute on Chronice diastolic congestive heart failure although normal BNP 60.9 in the setting of obesity.  Grossly fluid overloaded on presentation, and is being diuresed, appreciate cardiology input, status post Lasix IV x1 today with plan to switch to oral torsemide 60 mg twice daily tomorrow.  She has had good output which is significantly down. reports her weight was around 350 back in February current weight 362/164.6 kg  W/ diuresis, her I/O: Net IO Since Admission: -10,938 mL [05/14/20 1145]  Filed Weights   05/11/20 0424 05/12/20  2050 05/14/20 1100  Weight: (!) 171.3 kg (!) 169.3 kg (!) 164.6 kg   Mildly elevated troponin suspect demand ischemia due to hypoxia. Trop 22> 15- normalized.  No chest pain. HTN: BP is  controlled.  Continue curent Coreg with holding parameters,, cont Aldactone 25 mg daily HLD: Continue simvastatin 40 mg  Asthma without exacerbation respiratory status stable now, continue Pulmicort, nebs.  Continue supplemental oxygen  Chronic arthritis of the knee, Dr. [name] input appreciated.  Patient will follow up outpatient for a steroid injection.  GERD continue PPI  CKD stage IIIb: Renal function remains overall stable and tolerating diuresis well. Recent Labs    03/08/20 1347 05/07/20 1754 05/09/20 0434 05/10/20 0844 05/11/20 0347 05/12/20 0541 05/13/20 0334  BUN 34* 29* 23 20 25* 25* 27*  CREATININE 1.70* 1.48* 1.60* 1.62* 1.75* 1.55* 1.56*   T2DM, last A1c 6.4.  Blood sugar is controlled, continue diet modification  Recent Labs  Lab 05/07/20 1858 05/08/20 2151  GLUCAP 102* 107*   Chronic anemia: HB is stable. In the setting of chronic disease. Recent Labs  Lab 05/09/20 0434 05/10/20 0844 05/11/20 0347 05/12/20 0541 05/13/20 0334  HGB 10.8* 11.0* 11.5* 10.9* 10.7*  HCT 35.5* 36.3 37.6 35.8* 34.6*   Morbid obesity BMI 65 , ABG suggests OSA/versus with secondary pulmonary hypertension Dr. Erskine Emery is planning to have expedited sleep study and outpatient follow-up.  Will benefit with outpatient follow-up and weight loss strategy from primary care..  Diet Order            Diet heart healthy/carb modified Room service appropriate? Yes; Fluid consistency: Thin  Diet effective now                Patient's Body mass index is 62.29 kg/m. DVT prophylaxis: lovenox Code Status:   Code Status: Full Code  Family Communication: plan of care discussed with patient at bedside.  Patient self updating.  Status is: Inpatient Remains inpatient appropriate because:Inpatient level of care appropriate due to severity of illness  Dispo: The patient is from: Home              Anticipated d/c is to:  SNF hopefully tomorrow if respiratory status is stable and rt elbow bruise  swelling stable. Covid done 4/11,SNF  Authorization and bed completed.              Patient currently is not medically stable to d/c.   Difficult to place patient No   Unresulted Labs (From admission, onward)          Start     Ordered   05/09/20 0500  CBC  Every 72 hours,   R      05/08/20 1233          Medications reviewed:  Scheduled Meds: . (feeding supplement) PROSource Plus  30 mL Oral BID BM  . allopurinol  100 mg Oral Daily  . apixaban  10 mg Oral BID   Followed by  . [START ON 05/19/2020] apixaban  5 mg Oral BID  . arformoterol  15 mcg Nebulization BID  . budesonide (PULMICORT) nebulizer solution  0.5 mg Nebulization BID  . carvedilol  12.5 mg Oral BID WC  . cyanocobalamin  1,000 mcg Intramuscular Q30 days  . doxycycline  100 mg Oral Q12H  . famotidine  20 mg Oral Daily  . ferrous sulfate  325 mg Oral Q M,W,F  . folic acid  026 mcg Oral QHS  . gabapentin  100 mg Oral TID  .  montelukast  10 mg Oral QHS  . Oxcarbazepine  300 mg Oral BID  . potassium chloride  20 mEq Oral Daily  . saccharomyces boulardii  250 mg Oral BID  . simvastatin  40 mg Oral QHS  . sodium chloride flush  3 mL Intravenous Q12H  . spironolactone  25 mg Oral Daily  . torsemide  60 mg Oral BID   Continuous Infusions:   Consultants: Cardiology  Procedures:see note  Antimicrobials: Anti-infectives (From admission, onward)   Start     Dose/Rate Route Frequency Ordered Stop   05/12/20 1100  doxycycline (VIBRA-TABS) tablet 100 mg        100 mg Oral Every 12 hours 05/12/20 0956 05/15/20 0959   05/08/20 1100  clindamycin (CLEOCIN) IVPB 600 mg  Status:  Discontinued        600 mg 100 mL/hr over 30 Minutes Intravenous Every 8 hours 05/08/20 0913 05/12/20 0956   05/08/20 0215  clindamycin (CLEOCIN) IVPB 600 mg        600 mg 100 mL/hr over 30 Minutes Intravenous  Once 05/08/20 0211 05/08/20 0407     Culture/Microbiology    Component Value Date/Time   SDES URINE, RANDOM 02/15/2019 1815    SPECREQUEST  02/15/2019 1815    NONE Performed at [facility], [place] [address] [place], [place] [address]    CULT (A) 02/15/2019 1815    80,000 COLONIES/mL CITROBACTER FREUNDII 80,000 COLONIES/mL PSEUDOMONAS AERUGINOSA    REPTSTATUS 02/17/2019 FINAL 02/15/2019 1815    Other culture-see note  Objective: Vitals: Today's Vitals   05/13/20 2046 05/14/20 0359 05/14/20 0517 05/14/20 1100  BP:  133/75  (!) 119/57  Pulse:  79  76  Resp:  20  18  Temp:  98.6 F (37 C)  98.2 F (36.8 C)  TempSrc:    Oral  SpO2: 96% 90%  96%  Weight:    (!) 164.6 kg  Height:      PainSc:  0-No pain 10-Worst pain ever     Intake/Output Summary (Last 24 hours) at 05/14/2020 1145 Last data filed at 05/14/2020 1055 Gross per 24 hour  Intake 1083 ml  Output 2150 ml  Net -1067 ml   Filed Weights   05/11/20 0424 05/12/20 2050 05/14/20 1100  Weight: (!) 171.3 kg (!) 169.3 kg (!) 164.6 kg   Weight change:   Intake/Output from previous day: 04/11 0701 - 04/12 0700 In: 1200 [P.O.:1200] Out: 2150 [Urine:2150] Intake/Output this shift: Total I/O In: 3 [I.V.:3] Out: -  Filed Weights   05/11/20 0424 05/12/20 2050 05/14/20 1100  Weight: (!) 171.3 kg (!) 169.3 kg (!) 164.6 kg    Examination: General exam: AAOx3, obese, on [place], NAD, weak appearing. HEENT:Oral mucosa moist, Ear/Nose WNL grossly, dentition normal. Respiratory system: bilaterally clear,no wheezing or crackles,no use of accessory muscle Cardiovascular system: S1 & S2 +, No JVD,. Gastrointestinal system: Abdomen soft, NT,ND, BS+ Nervous System:Alert, awake, moving extremities and grossly nonfocal Extremities: No edema, distal peripheral pulses palpable.  Skin: No rashes,no icterus. MSK: Normal muscle bulk,tone, power Bruise on the right abdomen, bruise on the right elbow area with form a small tender swelling the center  Data Reviewed: I have personally reviewed following labs and imaging studies CBC: Recent Labs  Lab  05/07/20 1754 05/08/20 0937 05/09/20 0434 05/10/20 0844 05/11/20 0347 05/12/20 0541 05/13/20 0334  WBC 4.9  --  4.6 4.6 5.1 4.1 4.3  NEUTROABS 3.3  --   --   --   --   --   --  HGB 12.6   < > 10.8* 11.0* 11.5* 10.9* 10.7*  HCT 41.3   < > 35.5* 36.3 37.6 35.8* 34.6*  MCV 98.8  --  100.3* 99.7 98.9 100.8* 97.2  PLT 155  --  148* 131* 142* 129* 129*   < > = values in this interval not displayed.   Basic Metabolic Panel: Recent Labs  Lab 05/09/20 0434 05/10/20 0844 05/11/20 0347 05/12/20 0541 05/13/20 0334  NA 137 139 137 137 137  K 3.9 4.0 4.1 3.8 3.9  CL 98 99 94* 90* 90*  CO2 34* 32 33* 39* 41*  GLUCOSE 109* 164* 98 132* 99  BUN 23 20 25* 25* 27*  CREATININE 1.60* 1.62* 1.75* 1.55* 1.56*  CALCIUM 8.5* 8.5* 8.9 8.6* 8.8*   GFR: Estimated Creatinine Clearance: 53 mL/min (A) (by C-G formula based on SCr of 1.56 mg/dL (H)). Liver Function Tests: Recent Labs  Lab 05/09/20 0434  AST 17  ALT 16  ALKPHOS 73  BILITOT 0.9  PROT 5.7*  ALBUMIN 3.0*   No results for input(s): LIPASE, AMYLASE in the last 168 hours. No results for input(s): AMMONIA in the last 168 hours. Coagulation Profile: No results for input(s): INR, PROTIME in the last 168 hours. Cardiac Enzymes: No results for input(s): CKTOTAL, CKMB, CKMBINDEX, TROPONINI in the last 168 hours. BNP (last 3 results) No results for input(s): PROBNP in the last 8760 hours. HbA1C: No results for input(s): HGBA1C in the last 72 hours. CBG: Recent Labs  Lab 05/07/20 1858 05/08/20 2151  GLUCAP 102* 107*   Lipid Profile: No results for input(s): CHOL, HDL, LDLCALC, TRIG, CHOLHDL, LDLDIRECT in the last 72 hours. Thyroid Function Tests: No results for input(s): TSH, T4TOTAL, FREET4, T3FREE, THYROIDAB in the last 72 hours. Anemia Panel: No results for input(s): VITAMINB12, FOLATE, FERRITIN, TIBC, IRON, RETICCTPCT in the last 72 hours. Sepsis Labs: No results for input(s): PROCALCITON, LATICACIDVEN in the last 168  hours.  Recent Results (from the past 240 hour(s))  Resp Panel by RT-PCR (Flu A&B, Covid) Nasopharyngeal Swab     Status: None   Collection Time: 05/07/20 11:56 PM   Specimen: Nasopharyngeal Swab; Nasopharyngeal(NP) swabs in vial transport medium  Result Value Ref Range Status   SARS Coronavirus 2 by RT PCR NEGATIVE NEGATIVE Final    Comment: (NOTE) SARS-CoV-2 target nucleic acids are NOT DETECTED.  The SARS-CoV-2 RNA is generally detectable in upper respiratory specimens during the acute phase of infection. The lowest concentration of SARS-CoV-2 viral copies this assay can detect is 138 copies/mL. A negative result does not preclude SARS-Cov-2 infection and should not be used as the sole basis for treatment or other patient management decisions. A negative result may occur with  improper specimen collection/handling, submission of specimen other than nasopharyngeal swab, presence of viral mutation(s) within the areas targeted by this assay, and inadequate number of viral copies(<138 copies/mL). A negative result must be combined with clinical observations, patient history, and epidemiological information. The expected result is Negative.  Fact Sheet for Patients:  EntrepreneurPulse.com.au  Fact Sheet for Healthcare Providers:  IncredibleEmployment.be  This test is no t yet approved or cleared by the [place] FDA and  has been authorized for detection and/or diagnosis of SARS-CoV-2 by FDA under an Emergency Use Authorization (EUA). This EUA will remain  in effect (meaning this test can be used) for the duration of the COVID-19 declaration under Section 564(b)(1) of the Act, 21 U.S.C.section 360bbb-3(b)(1), unless the authorization is terminated  or revoked sooner.  Influenza A by PCR NEGATIVE NEGATIVE Final   Influenza B by PCR NEGATIVE NEGATIVE Final    Comment: (NOTE) The Xpert Xpress SARS-CoV-2/FLU/RSV plus assay is intended  as an aid in the diagnosis of influenza from Nasopharyngeal swab specimens and should not be used as a sole basis for treatment. Nasal washings and aspirates are unacceptable for Xpert Xpress SARS-CoV-2/FLU/RSV testing.  Fact Sheet for Patients: EntrepreneurPulse.com.au  Fact Sheet for Healthcare Providers: IncredibleEmployment.be  This test is not yet approved or cleared by the [place] FDA and has been authorized for detection and/or diagnosis of SARS-CoV-2 by FDA under an Emergency Use Authorization (EUA). This EUA will remain in effect (meaning this test can be used) for the duration of the COVID-19 declaration under Section 564(b)(1) of the Act, 21 U.S.C. section 360bbb-3(b)(1), unless the authorization is terminated or revoked.  Performed at [facility], [address], [place], [place] [address]   SARS CORONAVIRUS 2 (TAT 6-24 HRS) Nasopharyngeal Nasopharyngeal Swab     Status: None   Collection Time: 05/13/20  8:00 PM   Specimen: Nasopharyngeal Swab  Result Value Ref Range Status   SARS Coronavirus 2 NEGATIVE NEGATIVE Final    Comment: (NOTE) SARS-CoV-2 target nucleic acids are NOT DETECTED.  The SARS-CoV-2 RNA is generally detectable in upper and lower respiratory specimens during the acute phase of infection. Negative results do not preclude SARS-CoV-2 infection, do not rule out co-infections with other pathogens, and should not be used as the sole basis for treatment or other patient management decisions. Negative results must be combined with clinical observations, patient history, and epidemiological information. The expected result is Negative.  Fact Sheet for Patients: SugarRoll.be  Fact Sheet for Healthcare Providers: https://www.woods-mathews.com/  This test is not yet approved or cleared by the [place] FDA and  has been authorized for detection and/or diagnosis  of SARS-CoV-2 by FDA under an Emergency Use Authorization (EUA). This EUA will remain  in effect (meaning this test can be used) for the duration of the COVID-19 declaration under Se ction 564(b)(1) of the Act, 21 U.S.C. section 360bbb-3(b)(1), unless the authorization is terminated or revoked sooner.  Performed at [facility], [place] [address] [place], [place] [address]      Radiology Studies: No results found.   LOS: 6 days   Antonieta Pert, MD [facility]  05/14/2020, 11:45 AM

## 2020-05-14 NOTE — Progress Notes (Signed)
Occupational Therapy Treatment Patient Details Name: Miyoshi Ligas MRN: [id number] DOB: [birth year] Today's Date: 05/14/2020    History of present illness Pt is a 70 year old female who presented to ED on 05/07/20 with leg pain with progressively worsening shortness of breath x 10 days. Pt diagnosed with bilateral is indeterminate DVT of  tibial veins, with possible superimposed cellulitis and acute hypoxic respiratory failure. PMH includes HTN, HLD, CAD, CHF, asthma, CKD stage IIIb, T2DM, anemia, morbid obesity, and cellulitis.   OT comments  Pt progressing to EOB ADL. Pt limited by large body habitus, decreased strength, decreased ability to care for self and decreased mobility. [place] with bed rails; modA for BLEs swinging into bed. Pt  prefers rolling to R side and able to scoot self to [facility]. Pt minA for UB bathing/dressing and maxA for LB bathe/dress sitting EOB x15 mins for task. Pt would benefit from continued OT skilled services. OT following acutely.    Follow Up Recommendations  SNF    Equipment Recommendations  None recommended by OT    Recommendations for Other Services      Precautions / Restrictions Precautions Precautions: Fall Precaution Comments: denies any falls, monitor O2 Restrictions Weight Bearing Restrictions: No       Mobility Bed Mobility Overal bed mobility: Needs Assistance Bed Mobility: Supine to Sit     Supine to sit: Supervision;HOB elevated Sit to supine: Mod assist   General bed mobility comments: SupervisionA with bed rails; modA for BLEs swinging into bed. pt prefers rolling to R side.    Transfers                 General transfer comment: sitting EOB, scooting to [facility] for session    Balance Overall balance assessment: Needs assistance Sitting-balance support: Feet supported Sitting balance-Leahy Scale: Fair                                     ADL either performed or assessed with clinical judgement    ADL Overall ADL's : Needs assistance/impaired         Upper Body Bathing: Minimal assistance;Sitting Upper Body Bathing Details (indicate cue type and reason): sitting EOB x15 mins Lower Body Bathing: Maximal assistance;Sitting/lateral leans;Cueing for safety Lower Body Bathing Details (indicate cue type and reason): pt sitting EOB x15 mins; used to assist from spouse/HHA. Upper Body Dressing : Set up;Sitting                   Functional mobility during ADLs: +2 for physical assistance General ADL Comments: Pt limited by large body habitus, decreased strength, decreased ability to care for self and decreased mobility.     Vision       Perception     Praxis      Cognition Arousal/Alertness: Awake/alert Behavior During Therapy: WFL for tasks assessed/performed Overall Cognitive Status: Within Functional Limits for tasks assessed                                          Exercises     Shoulder Instructions       General Comments O2 on 2L for  >90%; VSS on RA.    Pertinent Vitals/ Pain       Pain Assessment: Faces Faces Pain Scale: Hurts little more Pain Location: BLEs  and back Pain Descriptors / Indicators: Discomfort;Sore Pain Intervention(s): Monitored during session  Home Living                                          Prior Functioning/Environment              Frequency  Min 2X/week        Progress Toward Goals  OT Goals(current goals can now be found in the care plan section)  Progress towards OT goals: Progressing toward goals  Acute Rehab OT Goals Patient Stated Goal: Return home OT Goal Formulation: With patient Time For Goal Achievement: 05/23/20 Potential to Achieve Goals: Good ADL Goals Pt Will Perform Upper Body Dressing: with set-up;sitting Pt Will Perform Lower Body Dressing: with min assist;with adaptive equipment;sit to/from stand;sitting/lateral leans Pt Will Transfer to Toilet: with  supervision;ambulating;bedside commode Pt Will Perform Toileting - Clothing Manipulation and hygiene: with supervision;sit to/from stand;sitting/lateral leans  Plan Discharge plan remains appropriate    Co-evaluation                 AM-PAC OT "6 Clicks" Daily Activity     Outcome Measure   Help from another person eating meals?: A Little Help from another person taking care of personal grooming?: A Little Help from another person toileting, which includes using toliet, bedpan, or urinal?: A Lot Help from another person bathing (including washing, rinsing, drying)?: A Lot Help from another person to put on and taking off regular upper body clothing?: A Little Help from another person to put on and taking off regular lower body clothing?: Total 6 Click Score: 14    End of Session Equipment Utilized During Treatment: Oxygen  OT Visit Diagnosis: Unsteadiness on feet (R26.81);Other abnormalities of gait and mobility (R26.89);Muscle weakness (generalized) (M62.81);Pain Pain - part of body: Leg   Activity Tolerance Patient tolerated treatment well;Patient limited by pain   Patient Left in bed;with call bell/phone within reach;with nursing/sitter in room   Nurse Communication Mobility status        Time: 2010-0712 OT Time Calculation (min): 41 min  Charges: OT General Charges $OT Visit: 1 Visit OT Treatments $Self Care/Home Management : 23-37 mins $Therapeutic Activity: 8-22 mins  Jefferey Pica, OTR/L Acute Rehabilitation Services Pager: [phone] Office: [phone]    Zurie Platas C 05/14/2020, 3:48 PM

## 2020-05-14 NOTE — Progress Notes (Signed)
Advanced Heart Failure Team Consult Note   Primary Physician: [name], MD PCP-Cardiologist:  Dr. Haroldine Laws   Reason for Consultation: Acute on Chronic Diastolic Heart Failure   HPI:    Remains on IV lasix. Got a dose of metolazone yesterday. Modest diuresis.  No weights checked. Renal function stable at 1.5.   Has 4 drinks on her tray. Says he back hurtr  Breathing improved. Denies orthopnea or PND.  Objective:    Vital Signs:   Temp:  [98.6 F (37 C)-99.4 F (37.4 C)] 98.6 F (37 C) (04/12 0359) Pulse Rate:  [72-79] 79 (04/12 0359) Resp:  [18-20] 20 (04/12 0359) BP: (97-133)/(61-88) 133/75 (04/12 0359) SpO2:  [90 %-99 %] 90 % (04/12 0359) Last BM Date: 05/13/20  Weight change: Filed Weights   05/10/20 0411 05/11/20 0424 05/12/20 2050  Weight: (!) 174.6 kg (!) 171.3 kg (!) 169.3 kg    Intake/Output:   Intake/Output Summary (Last 24 hours) at 05/14/2020 0709 Last data filed at 05/14/2020 0600 Gross per 24 hour  Intake 1200 ml  Output 2150 ml  Net -950 ml      Physical Exam    General:  Sitting up in bed. No resp difficulty HEENT: normal Neck: supple. Hard to see JVP looks about 7. Carotids 2+ bilat; no bruits. No lymphadenopathy or thryomegaly appreciated. Cor: PMI nondisplaced. Regular rate & rhythm. No rubs, gallops or murmurs. Lungs: clear Abdomen: obese soft, nontender, nondistended. No hepatosplenomegaly. No bruits or masses. Good bowel sounds. Extremities: no cyanosis, clubbing, rash, edema Neuro: alert & orientedx3, cranial nerves grossly intact. moves all 4 extremities w/o difficulty. Affect pleasant  Telemetry   Sinus 70-80s Personally reviewed   Labs   Basic Metabolic Panel: Recent Labs  Lab 05/09/20 0434 05/10/20 0844 05/11/20 0347 05/12/20 0541 05/13/20 0334  NA 137 139 137 137 137  K 3.9 4.0 4.1 3.8 3.9  CL 98 99 94* 90* 90*  CO2 34* 32 33* 39* 41*  GLUCOSE 109* 164* 98 132* 99  BUN 23 20 25* 25* 27*  CREATININE  1.60* 1.62* 1.75* 1.55* 1.56*  CALCIUM 8.5* 8.5* 8.9 8.6* 8.8*    Liver Function Tests: Recent Labs  Lab 05/09/20 0434  AST 17  ALT 16  ALKPHOS 73  BILITOT 0.9  PROT 5.7*  ALBUMIN 3.0*   No results for input(s): LIPASE, AMYLASE in the last 168 hours. No results for input(s): AMMONIA in the last 168 hours.  CBC: Recent Labs  Lab 05/07/20 1754 05/08/20 0937 05/09/20 0434 05/10/20 0844 05/11/20 0347 05/12/20 0541 05/13/20 0334  WBC 4.9  --  4.6 4.6 5.1 4.1 4.3  NEUTROABS 3.3  --   --   --   --   --   --   HGB 12.6   < > 10.8* 11.0* 11.5* 10.9* 10.7*  HCT 41.3   < > 35.5* 36.3 37.6 35.8* 34.6*  MCV 98.8  --  100.3* 99.7 98.9 100.8* 97.2  PLT 155  --  148* 131* 142* 129* 129*   < > = values in this interval not displayed.    Cardiac Enzymes: No results for input(s): CKTOTAL, CKMB, CKMBINDEX, TROPONINI in the last 168 hours.  BNP: BNP (last 3 results) Recent Labs    10/20/19 1226 05/07/20 1754  BNP 51.9 60.9    ProBNP (last 3 results) No results for input(s): PROBNP in the last 8760 hours.   CBG: Recent Labs  Lab 05/07/20 1858 05/08/20 2151  GLUCAP 102* 107*  Coagulation Studies: No results for input(s): LABPROT, INR in the last 72 hours.   Imaging   No results found.   Medications:     Current Medications: . (feeding supplement) PROSource Plus  30 mL Oral BID BM  . allopurinol  100 mg Oral Daily  . apixaban  10 mg Oral BID   Followed by  . [START ON 05/19/2020] apixaban  5 mg Oral BID  . arformoterol  15 mcg Nebulization BID  . budesonide (PULMICORT) nebulizer solution  0.5 mg Nebulization BID  . carvedilol  12.5 mg Oral BID WC  . cyanocobalamin  1,000 mcg Intramuscular Q30 days  . doxycycline  100 mg Oral Q12H  . famotidine  20 mg Oral Daily  . ferrous sulfate  325 mg Oral Q M,W,F  . folic acid  650 mcg Oral QHS  . furosemide  80 mg Intravenous BID  . gabapentin  100 mg Oral TID  . montelukast  10 mg Oral QHS  . Oxcarbazepine  300  mg Oral BID  . potassium chloride  20 mEq Oral Daily  . saccharomyces boulardii  250 mg Oral BID  . simvastatin  40 mg Oral QHS  . sodium chloride flush  3 mL Intravenous Q12H  . spironolactone  25 mg Oral Daily    Infusions:    Assessment/Plan   1. Acute Hypoxic Respiratory Failure - O2 sats initially in the 80s in ED, improved w/ Supp O2 - Admit CXR unremarkable. No edema, effusions or infiltrates - Chest CT negative for PE - BNP normal at 60 but likely falsely low due to obesity.  - multifactorial 2/2 acute asthma exacerbation + OHS + a/c dCHF  - Respiratory status improved with diuresis - hard to assess volume status on exam given habitus but seems much better  2. Bilateral LE DVT  - confirmed by venous dopplers. Chest CT negative for PE - Transitioned to eliquis - No bleeding   3. Acute on Chronic Diastolic Heart Failure - RHC 2008 c/w mild pulmonary hypertension, primarily due to high output state. Her mean pulmonary artery pressure was 27, wedge pressure was 10. PVR was 1.80 [place] units. - Echo 2017 LVEF 65-70%, G1DD, RV normal - Repeat Echo 4/8 EF remains normal at 60-65%, G2DD, IVC dilated with <50% respiratory variability - Volume status much improved - Suspect underlying OSA/OHS. Needs sleep study  - continue spiro 25 mg daily  - no SGLT2i given risk of GU infection given morbid obesity/ large panus  - would switch lasis to torsemide 60 bid  4. Acute Asthma Exacerbation  - management per triad  - respiratory status improving w/ treatment   5. LE Cellulitis  - AF. WBC normal - abx per triad  - improved  6. CAD:  - LHC in 2008 showed 50-60% LAD lesion and 30% RCA lesion - no s/s angina . EKG shows NSR no ST abnormality. Hs trop low level and flat, not c/w ACS - c/w  blocker and statin - On eliquis currently for DVT  7. Stage III CKD - Baseline SCr ~1.5 - Stable today with diuresis    She is stable for d/c from our standpoint.  Would switch lasix  80 tid to torsemide 60 bid on d/c. Stressed need to reduce amount of fluid intake.   Will arrange f/u with HF NP/PA  Will sign off.   Length of Stay: Normal, MD  05/14/2020, 7:09 AM  Advanced Heart Failure Team Pager [phone] (M-F; 7a -  5p)  Please contact [facility] for night-coverage after hours (4p -7a ) and weekends on [email or web address]

## 2020-05-15 DIAGNOSIS — S81801D Unspecified open wound, right lower leg, subsequent encounter: Secondary | ICD-10-CM | POA: Diagnosis not present

## 2020-05-15 DIAGNOSIS — L97919 Non-pressure chronic ulcer of unspecified part of right lower leg with unspecified severity: Secondary | ICD-10-CM | POA: Diagnosis not present

## 2020-05-15 DIAGNOSIS — R531 Weakness: Secondary | ICD-10-CM | POA: Diagnosis not present

## 2020-05-15 DIAGNOSIS — R600 Localized edema: Secondary | ICD-10-CM | POA: Diagnosis not present

## 2020-05-15 DIAGNOSIS — I872 Venous insufficiency (chronic) (peripheral): Secondary | ICD-10-CM | POA: Diagnosis not present

## 2020-05-15 DIAGNOSIS — M6281 Muscle weakness (generalized): Secondary | ICD-10-CM | POA: Diagnosis not present

## 2020-05-15 DIAGNOSIS — I87301 Chronic venous hypertension (idiopathic) without complications of right lower extremity: Secondary | ICD-10-CM | POA: Diagnosis not present

## 2020-05-15 DIAGNOSIS — Z7401 Bed confinement status: Secondary | ICD-10-CM | POA: Diagnosis not present

## 2020-05-15 DIAGNOSIS — E1122 Type 2 diabetes mellitus with diabetic chronic kidney disease: Secondary | ICD-10-CM | POA: Diagnosis not present

## 2020-05-15 DIAGNOSIS — S301XXA Contusion of abdominal wall, initial encounter: Secondary | ICD-10-CM | POA: Diagnosis not present

## 2020-05-15 DIAGNOSIS — M130 Polyarthritis, unspecified: Secondary | ICD-10-CM | POA: Diagnosis not present

## 2020-05-15 DIAGNOSIS — M25561 Pain in right knee: Secondary | ICD-10-CM | POA: Diagnosis not present

## 2020-05-15 DIAGNOSIS — N1832 Chronic kidney disease, stage 3b: Secondary | ICD-10-CM | POA: Diagnosis not present

## 2020-05-15 DIAGNOSIS — N1831 Chronic kidney disease, stage 3a: Secondary | ICD-10-CM | POA: Diagnosis not present

## 2020-05-15 DIAGNOSIS — I5033 Acute on chronic diastolic (congestive) heart failure: Secondary | ICD-10-CM | POA: Diagnosis not present

## 2020-05-15 DIAGNOSIS — I890 Lymphedema, not elsewhere classified: Secondary | ICD-10-CM | POA: Diagnosis not present

## 2020-05-15 DIAGNOSIS — M2550 Pain in unspecified joint: Secondary | ICD-10-CM | POA: Diagnosis not present

## 2020-05-15 DIAGNOSIS — E1142 Type 2 diabetes mellitus with diabetic polyneuropathy: Secondary | ICD-10-CM | POA: Diagnosis not present

## 2020-05-15 DIAGNOSIS — D631 Anemia in chronic kidney disease: Secondary | ICD-10-CM | POA: Diagnosis not present

## 2020-05-15 DIAGNOSIS — J449 Chronic obstructive pulmonary disease, unspecified: Secondary | ICD-10-CM | POA: Diagnosis not present

## 2020-05-15 DIAGNOSIS — I10 Essential (primary) hypertension: Secondary | ICD-10-CM | POA: Diagnosis not present

## 2020-05-15 DIAGNOSIS — G894 Chronic pain syndrome: Secondary | ICD-10-CM | POA: Diagnosis not present

## 2020-05-15 DIAGNOSIS — E1140 Type 2 diabetes mellitus with diabetic neuropathy, unspecified: Secondary | ICD-10-CM | POA: Diagnosis not present

## 2020-05-15 DIAGNOSIS — J9601 Acute respiratory failure with hypoxia: Secondary | ICD-10-CM | POA: Diagnosis not present

## 2020-05-15 DIAGNOSIS — I82409 Acute embolism and thrombosis of unspecified deep veins of unspecified lower extremity: Secondary | ICD-10-CM | POA: Diagnosis not present

## 2020-05-15 DIAGNOSIS — U099 Post covid-19 condition, unspecified: Secondary | ICD-10-CM | POA: Diagnosis not present

## 2020-05-15 DIAGNOSIS — I509 Heart failure, unspecified: Secondary | ICD-10-CM | POA: Diagnosis not present

## 2020-05-15 DIAGNOSIS — E661 Drug-induced obesity: Secondary | ICD-10-CM | POA: Diagnosis not present

## 2020-05-15 DIAGNOSIS — I82403 Acute embolism and thrombosis of unspecified deep veins of lower extremity, bilateral: Secondary | ICD-10-CM | POA: Diagnosis not present

## 2020-05-15 DIAGNOSIS — R0602 Shortness of breath: Secondary | ICD-10-CM | POA: Diagnosis not present

## 2020-05-15 DIAGNOSIS — N2581 Secondary hyperparathyroidism of renal origin: Secondary | ICD-10-CM | POA: Diagnosis not present

## 2020-05-15 DIAGNOSIS — M170 Bilateral primary osteoarthritis of knee: Secondary | ICD-10-CM | POA: Diagnosis not present

## 2020-05-15 DIAGNOSIS — I5032 Chronic diastolic (congestive) heart failure: Secondary | ICD-10-CM | POA: Diagnosis not present

## 2020-05-15 DIAGNOSIS — Z743 Need for continuous supervision: Secondary | ICD-10-CM | POA: Diagnosis not present

## 2020-05-15 DIAGNOSIS — E782 Mixed hyperlipidemia: Secondary | ICD-10-CM | POA: Diagnosis not present

## 2020-05-15 DIAGNOSIS — R5381 Other malaise: Secondary | ICD-10-CM | POA: Diagnosis not present

## 2020-05-15 LAB — CBC
HCT: 33.3 % — ABNORMAL LOW (ref 36.0–46.0)
Hemoglobin: 10.4 g/dL — ABNORMAL LOW (ref 12.0–15.0)
MCH: 30.7 pg (ref 26.0–34.0)
MCHC: 31.2 g/dL (ref 30.0–36.0)
MCV: 98.2 fL (ref 80.0–100.0)
Platelets: 123 10*3/uL — ABNORMAL LOW (ref 150–400)
RBC: 3.39 MIL/uL — ABNORMAL LOW (ref 3.87–5.11)
RDW: 14.2 % (ref 11.5–15.5)
WBC: 4.6 10*3/uL (ref 4.0–10.5)
nRBC: 0 % (ref 0.0–0.2)

## 2020-05-15 LAB — BASIC METABOLIC PANEL
Anion gap: 7 (ref 5–15)
BUN: 38 mg/dL — ABNORMAL HIGH (ref 8–23)
CO2: 43 mmol/L — ABNORMAL HIGH (ref 22–32)
Calcium: 8.9 mg/dL (ref 8.9–10.3)
Chloride: 86 mmol/L — ABNORMAL LOW (ref 98–111)
Creatinine, Ser: 1.5 mg/dL — ABNORMAL HIGH (ref 0.44–1.00)
GFR, Estimated: 37 mL/min — ABNORMAL LOW (ref >60)
Glucose, Bld: 105 mg/dL — ABNORMAL HIGH (ref 70–99)
Potassium: 3.5 mmol/L (ref 3.5–5.1)
Sodium: 136 mmol/L (ref 135–145)

## 2020-05-15 MED ORDER — PROSOURCE PLUS PO LIQD: 30.0000 mL | Freq: Two times a day (BID) | ORAL | Status: DC

## 2020-05-15 MED ORDER — TORSEMIDE 60 MG PO TABS: 60.0000 mg | ORAL_TABLET | Freq: Two times a day (BID) | ORAL | Status: DC

## 2020-05-15 MED ORDER — APIXABAN 5 MG PO TABS: ORAL_TABLET | ORAL | Status: DC

## 2020-05-15 MED ORDER — ACETAMINOPHEN 325 MG PO TABS: 650.0000 mg | ORAL_TABLET | Freq: Four times a day (QID) | ORAL | Status: DC | PRN

## 2020-05-15 MED ORDER — SACCHAROMYCES BOULARDII 250 MG PO CAPS: 250.0000 mg | ORAL_CAPSULE | Freq: Two times a day (BID) | ORAL | 0 refills | Status: AC

## 2020-05-15 MED ORDER — OXYCODONE HCL 5 MG PO TABS: 5.0000 mg | ORAL_TABLET | Freq: Four times a day (QID) | ORAL | 0 refills | Status: DC | PRN

## 2020-05-15 NOTE — Progress Notes (Signed)
Physical Therapy Treatment Patient Details Name: April [name] MRN: [id number] DOB: Jan 04, 1951 Today's Date: 05/15/2020    History of Present Illness Pt is a 70 year old female who presented to ED on 05/07/20 with leg pain with progressively worsening shortness of breath x 10 days. Pt diagnosed with bilateral is indeterminate DVT of  tibial veins, with possible superimposed cellulitis and acute hypoxic respiratory failure. PMH includes HTN, HLD, CAD, CHF, asthma, CKD stage IIIb, T2DM, anemia, morbid obesity, and cellulitis.    PT Comments    Continuing work on functional mobility and activity tolerance;  Session focused on obtaining serial BPs to rule in/out BP drop as cause of her persistent dizziness with upright activity --- Ultimately, pt was unable to stand long enough to obtain a standing read (or 3 minutes standing read);    05/15/20 1130 05/15/20 1135  Vital Signs  Patient Position (if appropriate) Orthostatic Vitals  --   Orthostatic Lying   BP- Lying 105/59  --   Pulse- Lying 77  --   Orthostatic Sitting  BP- Sitting 131/85 131/72  Pulse- Sitting 80 80 (after trnasfer bed to chair)  Orthostatic Standing at 0 minutes  BP- Standing at 0 minutes  (Unable to stand long enough to obtain BP)  --   Orthostatic Standing at 3 minutes  BP- Standing at 3 minutes  (Unable to stand long enough to obtain BP)  --    Still, no BP drop between supine and sitting, and BPs before and after standing activity are largely unchanged; While I did not appreciate any obvoius nystagmus this session, it may be worth asking one of my Vestibular Specialist colleagues to take a closer look.  It is also worth considering asking pt's husband to come in to get a feel for how he does assisting her; She was at a SNF for quite a while, and I get the feeling that she would really like to get back home from this admission.   Follow Up Recommendations  SNF     Equipment Recommendations  None recommended by  PT    Recommendations for Other Services       Precautions / Restrictions Precautions Precautions: Fall Precaution Comments: denies any falls, monitor O2    Mobility  Bed Mobility Overal bed mobility: Needs Assistance Bed Mobility: Supine to Sit     Supine to sit: Min assist;HOB elevated     General bed mobility comments: Good positioning of body to allow for LEs to come off of the bed; Heavy use of bedrails to pull up, as well as min handheld assist    Transfers Overall transfer level: Needs assistance Equipment used: Rolling walker (2 wheeled) Transfers: Sit to/from Stand Sit to Stand: +2 physical assistance;Mod assist         General transfer comment: Heavy dependence on mobility and using RW to fully push up; Pt ws able to cue for pushing up under her bottom to help her stand  Ambulation/Gait Ambulation/Gait assistance: Min assist;+2 safety/equipment Gait Distance (Feet): 3 Feet (pivot steps bed to chair) Assistive device: Rolling walker (2 wheeled) Gait Pattern/deviations: Decreased step length - right;Decreased step length - left;Shuffle;Trunk flexed;Wide base of support     General Gait Details: Heavy lean on RW and trunk flexed; Reports knee pain throughtout transfer   Stairs             Wheelchair Mobility    Modified Rankin (Stroke Patients Only)       Balance  Sitting balance-Leahy Scale: Fair       Standing balance-Leahy Scale: Poor Standing balance comment: heavy support of UE's on walker and external assistance needed                            Cognition Arousal/Alertness: Awake/alert Behavior During Therapy: WFL for tasks assessed/performed Overall Cognitive Status: Within Functional Limits for tasks assessed                                        Exercises      General Comments General comments (skin integrity, edema, etc.): O2 on 2L for  >90%; VSS on RA.      Pertinent Vitals/Pain Pain  Assessment: Faces Faces Pain Scale: Hurts even more Pain Location: BLEs and back Pain Descriptors / Indicators: Discomfort;Sore Pain Intervention(s): Monitored during session    Home Living                      Prior Function            PT Goals (current goals can now be found in the care plan section) Acute Rehab PT Goals Patient Stated Goal: Return home PT Goal Formulation: With patient Time For Goal Achievement: 05/24/20 Potential to Achieve Goals: Good Progress towards PT goals: Progressing toward goals (slowly)    Frequency    Min 3X/week      PT Plan Current plan remains appropriate    Co-evaluation              AM-PAC PT "6 Clicks" Mobility   Outcome Measure  Help needed turning from your back to your side while in a flat bed without using bedrails?: A Little Help needed moving from lying on your back to sitting on the side of a flat bed without using bedrails?: A Little Help needed moving to and from a bed to a chair (including a wheelchair)?: A Lot Help needed standing up from a chair using your arms (e.g., wheelchair or bedside chair)?: A Lot Help needed to walk in hospital room?: A Lot Help needed climbing 3-5 steps with a railing? : Total 6 Click Score: 13    End of Session Equipment Utilized During Treatment: Oxygen;Other (comment) (rolled sheet as a gait belt) Activity Tolerance: Patient limited by fatigue;Patient limited by pain Patient left: in chair;with call bell/phone within reach Nurse Communication: Mobility status PT Visit Diagnosis: Other abnormalities of gait and mobility (R26.89);Pain Pain - Right/Left:  (bilateral) Pain - part of body: Leg     Time: 1135-1200 PT Time Calculation (min) (ACUTE ONLY): 25 min  Charges:  $Therapeutic Activity: 23-37 mins                     Roney Marion, PT  Acute Rehabilitation Services Pager [phone] Office [place] 05/15/2020, 2:20 PM

## 2020-05-15 NOTE — Progress Notes (Signed)
Called [facility] to give report, RN Lanvi, received the report. All questions answered. Awaiting transport.

## 2020-05-15 NOTE — Plan of Care (Signed)
  Problem: Activity: Goal: Risk for activity intolerance will decrease 05/15/2020 2015 by Babs Sciara, RN Outcome: Completed/Met 05/15/2020 2014 by Babs Sciara, RN Outcome: Progressing

## 2020-05-15 NOTE — Progress Notes (Signed)
DISCHARGE NOTE SNF [facility] to be discharged [place] per MD order. Patient verbalized understanding.  Skin clean, dry and intact without evidence of skin break down, no evidence of skin tears noted. IV catheter discontinued intact. Site without signs and symptoms of complications. Dressing and pressure applied. Pt denies pain at the site currently. No complaints noted.  Patient free of lines, drains, and wounds.   Discharge packet assembled. An After Visit Summary (AVS) was printed and given to the EMS personnel. Patient escorted via stretcher and discharged to [facility] via ambulance. Report called to accepting facility; all questions and concerns addressed.   Babs Sciara, RN

## 2020-05-15 NOTE — TOC Transition Note (Signed)
Transition of Care [facility]) - CM/SW Discharge Note *Discharged to [facility]   Patient Details  Name: April [name] MRN: [id number] Date of Birth: [birth year]  Transition of Care [facility]) CM/SW Contact:  Sable Feil, LCSW Phone Number: 05/15/2020, 6:58 PM   Clinical Narrative:  Patient discharging to [facility] today, transported by non-emergency ambulance transport. [facility] liaison Harriet Pho contacted and informed of patient's readiness for discharge. Patient indicated that she would contact her husband regarding her discharge.    Final next level of care: [facility]) Barriers to Discharge: Barriers Resolved   Patient Goals and CMS Choice Patient states their goals for this hospitalization and ongoing recovery are:: Patient agreeable to [facility] CMS Medicare.gov Compare Post Acute Care list provided to:: Patient Choice offered to / list presented to : Patient  Discharge Placement   Existing PASRR number confirmed : 05/10/20          Patient chooses bed at: [facility] Patient to be transferred to facility by: Non-emergency ambulance transport Name of family member notified: Patient informed CSW that she will call her husband Patient and family notified of of transfer: 05/15/20  Discharge Plan and Services In-house Referral: Clinical Social Work,[facility] Discharge Planning Services: CM Consult Post Acute Care Choice: Durable Medical Equipment (hospital bed; bsc; rw; ramp; wheelchair)          DME Arranged: Oxygen DME Agency: [employer] Date DME Agency Contacted: 05/10/20 Time DME Agency Contacted: 1100 Representative spoke with at DME Agency: [place]: RN,PT,OT [place] Agency: [facility] (formerly [employer]) (now known as [employer]) Date HH Agency Contacted: 05/10/20 Time [place]: 1115 Representative spoke with at [place]: [place]  Social  Determinants of Health ([place]) Interventions  No SDOH interventions requested or needed at discharge   Readmission Risk Interventions No flowsheet data found.

## 2020-05-15 NOTE — Progress Notes (Signed)
PROGRESS NOTE    April [name]  XVQ:[id number] DOB: 1950/10/02 DOA: 05/07/2020 PCP: [name], MD   Chief Complaint  Patient presents with  . Shortness of Breath  Brief Narrative: 70 year old female with history of HTN, HLD, CAD, CHF, asthma, CKD stage IIIb, T2DM, anemia, morbid obesity, history of cellulitis presented with shortness of breath, leg pain with progressively worsening shortness of breath, difficulty 10 days, walk to the bathroom.  Was having intermittent wheezing and mild productive cough with yellow sputum x1 week.  Baseline echocardiogram.,  But mostly sedentary, has gained 30 pound weight since January. In the ED, patient was seen to be afebrile, blood pressure 100/55-140/72, and O2 saturations noted to be as low as 85% currently maintained on 2 L of oxygen.Labs significant for chloride 96, CO2 34, BUN 29, and creatinine 1.48.Angiogram of the chest with contrast was obtained showing no clear signs of PE,mild cardiomegaly, pulmonary artery hypertension, small hiatal hernia, and likely atelectasis.Influenza and COVID-19 screening were negative.  Patient had been started on clindamycin IV.KDT:OIZ1 69 pH 7.3, PO2 112. Patient was also found to have is indeterminate bilateral lower extremity DVT and volume overload. Seen by cardiology and being diuresed with IV Lasix.  Anticoagulation transitionED to oral Eliquis 4/10. She has been diuresed well. Now needing 2 L nasal cannula supplemental.  Subjective: Seen this morning she was undecided about home versus skilled nursing facility. Check back again and she had just completed physical therapy and now complains of being dizzy and headache and does not feel ready for discharge today.  Assessment & Plan:  Bilateral age indeterminate DVT of  Tibial veins,  with possible superimposed cellulitis Chronic lymphedema: Cellulitis ruled out.  Continue Eliquis for DVT.  She had worsening edema due to acute on chronic diastolic CHF and was  diuresed.  Continue PT OT, skilled nursing facility.   Right upper extremity bruise/swelling - ? From trauma, ?2/2 lovenox anticoagulation. ultrasound soft tissue as well as CT of the right humerus without contrast -did not show any acute finding.  She has a skin discoloration appears to be slightly enlarged from yesterday, middle of the discoloration has a tender firm swelling, again no abscess on the CT, no tenderness besides the central area-suspect related anticoagulation possible trauma.  On empiric doxycycline continue to complete the course.  Monitor closely- will need close follow-up with PCP plus minus dermatology referral. Recent Labs  Lab 05/10/20 0844 05/11/20 0347 05/12/20 0541 05/13/20 0334 05/15/20 0428  WBC 4.6 5.1 4.1 4.3 4.6   Acute hypoxic respiratory failure:multifactorial with acute on chronic diastolic CHF, obesity hypoventilation/OSA and secondary pulmonary hypertension. CT angio did not show acute PE.PCCM Dr. Erskine Emery has reviewed the case and plans for outpatient follow-up sleep apnea evaluation.  Doing well on 2 L nasal cannula does endorse some shortness of breath appears to be stable clinically  Acute on Chronice diastolic congestive heart failure although normal BNP 60.9 in the setting of obesity.  Grossly fluid overloaded on presentation.  Treated with aggressive IV Lasix total net -13.2 L.  Now on torsemide 60 mg twice daily, seen by CHF team and signed off.  Will need outpatient close follow-up.  Continue to 1.2l fluid restriction monitoring of intake output and weight. Net IO Since Admission: -13,260 mL [05/15/20 1201]  Filed Weights   05/11/20 0424 05/12/20 2050 05/14/20 1100  Weight: (!) 171.3 kg (!) 169.3 kg (!) 164.6 kg   Mildly elevated troponin suspect demand ischemia due to hypoxia. Trop 22> 15-  normalized.  No chest pain. HTN: BP controlled.Continue curent Coreg with holding parameters,cont Aldactone 25 mg daily HLD: Continue simvastatin 40 mg  Asthma  without exacerbation respiratory status stable now, continue Pulmicort, nebs.  Continue supplemental oxygen, at 2 L currently.  Chronic arthritis of the knee, Dr. [name] input appreciated.  Patient will follow up outpatient for a steroid injection.  GERD on PPI  CKD stage IIIb: Renal function remains overall stable and tolerating diuresis well.  Close follow-up with PCP Recent Labs    03/08/20 1347 05/07/20 1754 05/09/20 0434 05/10/20 0844 05/11/20 0347 05/12/20 0541 05/13/20 0334 05/15/20 0428  BUN 34* 29* 23 20 25* 25* 27* 38*  CREATININE 1.70* 1.48* 1.60* 1.62* 1.75* 1.55* 1.56* 1.50*   T2DM, last A1c 6.4.  Controlled on diet modification. Recent Labs  Lab 05/08/20 2151  GLUCAP 107*   Chronic anemia: Appears stable.  In the setting of chronic disease. Recent Labs  Lab 05/10/20 0844 05/11/20 0347 05/12/20 0541 05/13/20 0334 05/15/20 0428  HGB 11.0* 11.5* 10.9* 10.7* 10.4*  HCT 36.3 37.6 35.8* 34.6* 33.3*   Morbid obesity BMI 65 , ABG suggests OSA/versus with secondary pulmonary hypertension Dr. Erskine Emery is planning to have expedited sleep study and outpatient follow-up.  Will benefit with outpatient follow-up and weight loss strategy from primary care.Marland Kitchen  Headache/dizziness post PT.  Check orthostatic vitals, add pain control.  Diet Order            Diet heart healthy/carb modified Room service appropriate? Yes; Fluid consistency: Thin  Diet effective now                Patient's Body mass index is 62.29 kg/m. DVT prophylaxis: lovenox Code Status:   Code Status: Full Code  Family Communication: plan of care discussed with patient at bedside.  Patient self updating.  Status is: Inpatient Remains inpatient appropriate because:Inpatient level of care appropriate due to severity of illness  Dispo: The patient is from: Home              Anticipated d/c is to:  SNF recommended.  Complains of dizziness and headache post PT does not feel ready for today.  She has  been informed that she has a bed available and Covid was ordered day before and she has to make a decision soon.               Patient currently is not medically stable to d/c.   Difficult to place patient No   Unresulted Labs (From admission, onward)          Start     Ordered   05/15/20 0500  CBC  Daily,   R     Question:  Specimen collection method  Answer:  Lab=Lab collect   05/14/20 1155   05/15/20 0102  Basic metabolic panel  Daily,   R     Question:  Specimen collection method  Answer:  Lab=Lab collect   05/14/20 1155   05/09/20 0500  CBC  Every 72 hours,   R      05/08/20 1233          Medications reviewed:  Scheduled Meds: . (feeding supplement) PROSource Plus  30 mL Oral BID BM  . allopurinol  100 mg Oral Daily  . apixaban  10 mg Oral BID   Followed by  . [START ON 05/19/2020] apixaban  5 mg Oral BID  . arformoterol  15 mcg Nebulization BID  . budesonide (PULMICORT) nebulizer solution  0.5 mg Nebulization BID  . carvedilol  12.5 mg Oral BID WC  . cyanocobalamin  1,000 mcg Intramuscular Q30 days  . famotidine  20 mg Oral Daily  . ferrous sulfate  325 mg Oral Q M,W,F  . folic acid  761 mcg Oral QHS  . gabapentin  100 mg Oral TID  . montelukast  10 mg Oral QHS  . Oxcarbazepine  300 mg Oral BID  . potassium chloride  20 mEq Oral Daily  . saccharomyces boulardii  250 mg Oral BID  . simvastatin  40 mg Oral QHS  . sodium chloride flush  3 mL Intravenous Q12H  . spironolactone  25 mg Oral Daily  . torsemide  60 mg Oral BID   Continuous Infusions:   Consultants: Cardiology  Procedures:see note  Antimicrobials: Anti-infectives (From admission, onward)   Start     Dose/Rate Route Frequency Ordered Stop   05/12/20 1100  doxycycline (VIBRA-TABS) tablet 100 mg        100 mg Oral Every 12 hours 05/12/20 0956 05/14/20 2128   05/08/20 1100  clindamycin (CLEOCIN) IVPB 600 mg  Status:  Discontinued        600 mg 100 mL/hr over 30 Minutes Intravenous Every 8 hours  05/08/20 0913 05/12/20 0956   05/08/20 0215  clindamycin (CLEOCIN) IVPB 600 mg        600 mg 100 mL/hr over 30 Minutes Intravenous  Once 05/08/20 0211 05/08/20 0407     Culture/Microbiology    Component Value Date/Time   SDES URINE, RANDOM 02/15/2019 1815   SPECREQUEST  02/15/2019 1815    NONE Performed at [facility], [place] [address] [place], [place] [address]    CULT (A) 02/15/2019 1815    80,000 COLONIES/mL CITROBACTER FREUNDII 80,000 COLONIES/mL PSEUDOMONAS AERUGINOSA    REPTSTATUS 02/17/2019 FINAL 02/15/2019 1815    Other culture-see note  Objective: Vitals: Today's Vitals   05/15/20 0506 05/15/20 0847 05/15/20 0849 05/15/20 0940  BP: (!) 121/55   (!) 119/55  Pulse: 71   81  Resp: 18   19  Temp: 98.9 F (37.2 C)   98.6 F (37 C)  TempSrc: Oral   Oral  SpO2: 98% 98% 98% 96%  Weight:      Height:      PainSc:        Intake/Output Summary (Last 24 hours) at 05/15/2020 1201 Last data filed at 05/15/2020 1041 Gross per 24 hour  Intake 1638 ml  Output 3000 ml  Net -1362 ml   Filed Weights   05/11/20 0424 05/12/20 2050 05/14/20 1100  Weight: (!) 171.3 kg (!) 169.3 kg (!) 164.6 kg   Weight change:   Intake/Output from previous day: 04/12 0701 - 04/13 0700 In: 1062 [P.O.:1020; I.V.:303] Out: 3550 [Urine:3550] Intake/Output this shift: Total I/O In: 483 [P.O.:480; I.V.:3] Out: 650 [Urine:650] Filed Weights   05/11/20 0424 05/12/20 2050 05/14/20 1100  Weight: (!) 171.3 kg (!) 169.3 kg (!) 164.6 kg    Examination: General exam: AAOx3, obese, on [place] 02 NAD, weak appearing. HEENT:Oral mucosa moist, Ear/Nose WNL grossly, dentition normal. Respiratory system: bilaterally diminished,no wheezing or crackles,no use of accessory muscle Cardiovascular system: S1 & S2 +, No JVD,. Gastrointestinal system: Abdomen soft, obese, NT,ND, BS+ Nervous System:Alert, awake, moving extremities and grossly nonfocal Extremities: No edema, distal peripheral pulses  palpable.  Skin: No rashes,no icterus. MSK: Normal muscle bulk,tone, power Bruise on the right abdomen, skin discoloration on the right elbow area no tenderness in the  discolored area except for a small firm tender  Area in the center of the discoloration-appears to be stable in size  Data Reviewed: I have personally reviewed following labs and imaging studies CBC: Recent Labs  Lab 05/10/20 0844 05/11/20 0347 05/12/20 0541 05/13/20 0334 05/15/20 0428  WBC 4.6 5.1 4.1 4.3 4.6  HGB 11.0* 11.5* 10.9* 10.7* 10.4*  HCT 36.3 37.6 35.8* 34.6* 33.3*  MCV 99.7 98.9 100.8* 97.2 98.2  PLT 131* 142* 129* 129* 628*   Basic Metabolic Panel: Recent Labs  Lab 05/10/20 0844 05/11/20 0347 05/12/20 0541 05/13/20 0334 05/15/20 0428  NA 139 137 137 137 136  K 4.0 4.1 3.8 3.9 3.5  CL 99 94* 90* 90* 86*  CO2 32 33* 39* 41* 43*  GLUCOSE 164* 98 132* 99 105*  BUN 20 25* 25* 27* 38*  CREATININE 1.62* 1.75* 1.55* 1.56* 1.50*  CALCIUM 8.5* 8.9 8.6* 8.8* 8.9   GFR: Estimated Creatinine Clearance: 55.2 mL/min (A) (by C-G formula based on SCr of 1.5 mg/dL (H)). Liver Function Tests: Recent Labs  Lab 05/09/20 0434  AST 17  ALT 16  ALKPHOS 73  BILITOT 0.9  PROT 5.7*  ALBUMIN 3.0*   No results for input(s): LIPASE, AMYLASE in the last 168 hours. No results for input(s): AMMONIA in the last 168 hours. Coagulation Profile: No results for input(s): INR, PROTIME in the last 168 hours. Cardiac Enzymes: No results for input(s): CKTOTAL, CKMB, CKMBINDEX, TROPONINI in the last 168 hours. BNP (last 3 results) No results for input(s): PROBNP in the last 8760 hours. HbA1C: No results for input(s): HGBA1C in the last 72 hours. CBG: Recent Labs  Lab 05/08/20 2151  GLUCAP 107*   Lipid Profile: No results for input(s): CHOL, HDL, LDLCALC, TRIG, CHOLHDL, LDLDIRECT in the last 72 hours. Thyroid Function Tests: No results for input(s): TSH, T4TOTAL, FREET4, T3FREE, THYROIDAB in the last 72  hours. Anemia Panel: No results for input(s): VITAMINB12, FOLATE, FERRITIN, TIBC, IRON, RETICCTPCT in the last 72 hours. Sepsis Labs: No results for input(s): PROCALCITON, LATICACIDVEN in the last 168 hours.  Recent Results (from the past 240 hour(s))  Resp Panel by RT-PCR (Flu A&B, Covid) Nasopharyngeal Swab     Status: None   Collection Time: 05/07/20 11:56 PM   Specimen: Nasopharyngeal Swab; Nasopharyngeal(NP) swabs in vial transport medium  Result Value Ref Range Status   SARS Coronavirus 2 by RT PCR NEGATIVE NEGATIVE Final    Comment: (NOTE) SARS-CoV-2 target nucleic acids are NOT DETECTED.  The SARS-CoV-2 RNA is generally detectable in upper respiratory specimens during the acute phase of infection. The lowest concentration of SARS-CoV-2 viral copies this assay can detect is 138 copies/mL. A negative result does not preclude SARS-Cov-2 infection and should not be used as the sole basis for treatment or other patient management decisions. A negative result may occur with  improper specimen collection/handling, submission of specimen other than nasopharyngeal swab, presence of viral mutation(s) within the areas targeted by this assay, and inadequate number of viral copies(<138 copies/mL). A negative result must be combined with clinical observations, patient history, and epidemiological information. The expected result is Negative.  Fact Sheet for Patients:  EntrepreneurPulse.com.au  Fact Sheet for Healthcare Providers:  IncredibleEmployment.be  This test is no t yet approved or cleared by the [place] FDA and  has been authorized for detection and/or diagnosis of SARS-CoV-2 by FDA under an Emergency Use Authorization (EUA). This EUA will remain  in effect (meaning this test can be used) for  the duration of the COVID-19 declaration under Section 564(b)(1) of the Act, 21 U.S.C.section 360bbb-3(b)(1), unless the authorization is  terminated  or revoked sooner.       Influenza A by PCR NEGATIVE NEGATIVE Final   Influenza B by PCR NEGATIVE NEGATIVE Final    Comment: (NOTE) The Xpert Xpress SARS-CoV-2/FLU/RSV plus assay is intended as an aid in the diagnosis of influenza from Nasopharyngeal swab specimens and should not be used as a sole basis for treatment. Nasal washings and aspirates are unacceptable for Xpert Xpress SARS-CoV-2/FLU/RSV testing.  Fact Sheet for Patients: EntrepreneurPulse.com.au  Fact Sheet for Healthcare Providers: IncredibleEmployment.be  This test is not yet approved or cleared by the [place] FDA and has been authorized for detection and/or diagnosis of SARS-CoV-2 by FDA under an Emergency Use Authorization (EUA). This EUA will remain in effect (meaning this test can be used) for the duration of the COVID-19 declaration under Section 564(b)(1) of the Act, 21 U.S.C. section 360bbb-3(b)(1), unless the authorization is terminated or revoked.  Performed at [facility], [place] [address], [place], [place] [address]   SARS CORONAVIRUS 2 (TAT 6-24 HRS) Nasopharyngeal Nasopharyngeal Swab     Status: None   Collection Time: 05/13/20  8:00 PM   Specimen: Nasopharyngeal Swab  Result Value Ref Range Status   SARS Coronavirus 2 NEGATIVE NEGATIVE Final    Comment: (NOTE) SARS-CoV-2 target nucleic acids are NOT DETECTED.  The SARS-CoV-2 RNA is generally detectable in upper and lower respiratory specimens during the acute phase of infection. Negative results do not preclude SARS-CoV-2 infection, do not rule out co-infections with other pathogens, and should not be used as the sole basis for treatment or other patient management decisions. Negative results must be combined with clinical observations, patient history, and epidemiological information. The expected result is Negative.  Fact Sheet for  Patients: SugarRoll.be  Fact Sheet for Healthcare Providers: https://www.woods-mathews.com/  This test is not yet approved or cleared by the [place] FDA and  has been authorized for detection and/or diagnosis of SARS-CoV-2 by FDA under an Emergency Use Authorization (EUA). This EUA will remain  in effect (meaning this test can be used) for the duration of the COVID-19 declaration under Se ction 564(b)(1) of the Act, 21 U.S.C. section 360bbb-3(b)(1), unless the authorization is terminated or revoked sooner.  Performed at [facility], [place] [address] [place], [place] [address]      Radiology Studies: No results found.   LOS: 7 days   Antonieta Pert, MD [facility]  05/15/2020, 12:01 PM

## 2020-05-15 NOTE — Discharge Summary (Signed)
Physician Discharge Summary  Genese Quebedeaux KKX:[id number] DOB: 20-Feb-1950 DOA: 05/07/2020  PCP: [name], MD  Admit date: 05/07/2020 Discharge date: 05/15/2020  Admitted From:Home Disposition:SNF  Recommendations for Outpatient Follow-up:  1. Follow up with PCP in 1-2 weeks 2. Please obtain BMP/CBC in one week 3. Please follow up on the following pending results: 4. Monitor the rash bruise over right elbow posteriorly-in the center of which is a small area of tender firm swelling likely hematoma 0 ct and UA did not show fluid collection or abscess, and on the abdomen at the site of Lovenox injection  Home Health:no  Equipment/Devices:yes  Discharge Condition: Stable Code Status:   Code Status: Full Code Diet recommendation:  Diet Order            Diet - low sodium heart healthy           Diet heart healthy/carb modified Room service appropriate? Yes; Fluid consistency: Thin  Diet effective now                  Brief/Interim Summary: 70 year old female with history of HTN, HLD, CAD, CHF, asthma, CKD stage IIIb, T2DM, anemia, morbid obesity, history of cellulitis presented with shortness of breath, leg pain with progressively worsening shortness of breath, difficulty 10 days, walk to the bathroom.  Was having intermittent wheezing and mild productive cough with yellow sputum x1 week.  Baseline echocardiogram.,  But mostly sedentary, has gained 30 pound weight since January. In the ED, patient was seen to be afebrile, blood pressure 100/55-140/72, and O2 saturations noted to be as low as 85% currently maintained on 2 L of oxygen.Labs significant for chloride 96, CO2 34, BUN 29, andcreatinine 1.48.Angiogram of the chest with contrast was obtained showing no clear signs of PE,mild cardiomegaly, pulmonary artery hypertension, small hiatal hernia, and likely atelectasis.Influenza and COVID-19 screening were negative. Patient had been started on clindamycin IV.JIR:CVE9 69 pH 7.3, PO2  112. Patient was also found to have is indeterminate bilateral lower extremity DVT and volume overload. Seen by cardiology and being diuresed with IV Lasix.  Anticoagulation transitionED to oral Eliquis 4/10. She has been diuresed well. Now needing 2 L nasal cannula supplemental and respiratory status is stable overall. Spoke to her again, she feels well and has agreed to go to skilled nursing facility today  Discharge Diagnoses:   Bilateral age indeterminate DVT of  Tibial veins,  with possible superimposed cellulitis Chronic lymphedema: Cellulitis ruled out.  Continue Eliquis for DVT with 10 mg twice daily until 4/17 following which 5 mg twice daily at least 3-6 months- will need follow-up with PCP or hematology to determine further duration.She had worsening edema due to acute on chronic diastolic CHF and was diuresed.  Continue PT OT, skilled nursing facility.   Right upper extremity bruise/swelling - ? From trauma, ?2/2 lovenox anticoagulation. ultrasound soft tissue as well as CT of the right humerus without contrast -did not show any acute finding.  She has a skin discoloration appears to be slightly enlarged from yesterday, middle of the discoloration has a tender firm swelling, again no abscess on the CT, no tenderness besides the central area-suspect related anticoagulation possible trauma.  On empiric doxycycline continue to complete the course.  Monitor closely- will need close follow-up with PCP plus minus dermatology referral.  Instruction provided for skilled nursing facility to monitor it. Recent Labs  Lab 05/10/20 0844 05/11/20 0347 05/12/20 0541 05/13/20 0334 05/15/20 0428  WBC 4.6 5.1 4.1 4.3 4.6  Acute hypoxic respiratory failure:multifactorial with acute on chronic diastolic CHF, obesity hypoventilation/OSA and secondary pulmonary hypertension. CT angio did not show acute PE.PCCM Dr. Erskine Emery has reviewed the case and plans for outpatient follow-up sleep apnea evaluation.   Doing well on 2 L nasal cannula does endorse some shortness of breath appears to be stable clinically  Acute on Chronice diastolic congestive heart failure although normal BNP 60.9 in the setting of obesity.  Grossly fluid overloaded on presentation.  Treated with aggressive IV Lasix total net -13.2 L.  Now on torsemide 60 mg twice daily, seen by CHF team and signed off.  Will need outpatient close follow-up.  Continue to 1.2l fluid restriction monitoring of intake output and weight. Net IO Since Admission: -12,960 mL [05/15/20 1552]  Filed Weights   05/11/20 0424 05/12/20 2050 05/14/20 1100  Weight: (!) 171.3 kg (!) 169.3 kg (!) 164.6 kg   Mildly elevated troponin suspect demand ischemia due to hypoxia. Trop 22> 15- normalized.  No chest pain. HTN: BP controlled.Continue curent Coreg with holding parameters,cont Aldactone 25 mg daily HLD: Continue simvastatin 40 mg  Asthma without exacerbation respiratory status stable now, continue Pulmicort, nebs.  Continue supplemental oxygen, at 2 L currently.  Chronic arthritis of the knee, Dr. [name] input appreciated.  Patient will follow up outpatient for a steroid injection.  She is on oral oxycodone chronically and will be continued  GERD on PPI  CKD stage IIIb: Renal function remains overall stable and tolerating diuresis well.  Close follow-up with PCP. Recent Labs    03/08/20 1347 05/07/20 1754 05/09/20 0434 05/10/20 0844 05/11/20 0347 05/12/20 0541 05/13/20 0334 05/15/20 0428  BUN 34* 29* 23 20 25* 25* 27* 38*  CREATININE 1.70* 1.48* 1.60* 1.62* 1.75* 1.55* 1.56* 1.50*   T2DM, last A1c 6.4.  Controlled on diet modification. Recent Labs  Lab 05/08/20 2151  GLUCAP 107*   Chronic anemia: Appears stable.  In the setting of chronic disease. Recent Labs  Lab 05/10/20 0844 05/11/20 0347 05/12/20 0541 05/13/20 0334 05/15/20 0428  HGB 11.0* 11.5* 10.9* 10.7* 10.4*  HCT 36.3 37.6 35.8* 34.6* 33.3*   Morbid obesity BMI 65 , ABG  suggests OSA/versus with secondary pulmonary hypertension Dr. Erskine Emery is planning to have expedited sleep study and outpatient follow-up.  Will benefit with outpatient follow-up and weight loss strategy from primary care.Marland Kitchen  Headache/dizziness post PT. Tylenol. ptot.  Consults:  Cardiology  Subjective: Alert and oriented, checked this evening and wants to go to SNF today, feels well.  Discharge Exam: Vitals:   05/15/20 0849 05/15/20 0940  BP:  (!) 119/55  Pulse:  81  Resp:  19  Temp:  98.6 F (37 C)  SpO2: 98% 96%   General: Pt is alert, awake, not in acute distress Cardiovascular: RRR, S1/S2 +, no rubs, no gallops Respiratory: CTA bilaterally, no wheezing, no rhonchi Abdominal: Soft, NT, ND, bowel sounds + Extremities: no edema, no cyanosis  Discharge Instructions  Discharge Instructions    (HEART FAILURE PATIENTS) Call MD:  Anytime you have any of the following symptoms: 1) 3 pound weight gain in 24 hours or 5 pounds in 1 week 2) shortness of breath, with or without a dry hacking cough 3) swelling in the hands, feet or stomach 4) if you have to sleep on extra pillows at night in order to breathe.   Complete by: As directed    Diet - low sodium heart healthy   Complete by: As directed    Discharge  instructions   Complete by: As directed    Monitor the skin discoloration/rash/bruise on right elbow area posteriorly if further worsening need evaluation by PCP/dermatology.  Continue low-salt heart healthy diet and 1.2 L fluid restriction  Please call call MD or return to ER for similar or worsening recurring problem that brought you to hospital or if any fever,nausea/vomiting,abdominal pain, uncontrolled pain, chest pain,  shortness of breath or any other alarming symptoms.  Please follow-up your doctor as instructed in a week time and call the office for appointment.  Please avoid alcohol, smoking, or any other illicit substance and maintain healthy habits including taking  your regular medications as prescribed.  You were cared for by a hospitalist during your hospital stay. If you have any questions about your discharge medications or the care you received while you were in the hospital after you are discharged, you can call the unit and ask to speak with the hospitalist on call if the hospitalist that took care of you is not available.  Once you are discharged, your primary care physician will handle any further medical issues. Please note that NO REFILLS for any discharge medications will be authorized once you are discharged, as it is imperative that you return to your primary care physician (or establish a relationship with a primary care physician if you do not have one) for your aftercare needs so that they can reassess your need for medications and monitor your lab values   Increase activity slowly   Complete by: As directed      Allergies as of 05/15/2020      Reactions   Penicillins Rash, Other (See Comments)   She was told not to take it anymore. Has patient had a PCN reaction causing immediate rash, facial/tongue/throat swelling, SOB or lightheadedness with hypotension: Yes Has patient had a PCN reaction causing severe rash involving mucus membranes or skin necrosis: No Has patient had a PCN reaction that required hospitalization: No Has patient had a PCN reaction occurring within the last 10 years: No If all of the above answers are "NO", then may proceed with Cephalosporin use.'   Shellfish Allergy Anaphylaxis   Sulfonamide Derivatives Anaphylaxis, Other (See Comments)   Hydrochlorothiazide W-triamterene Other (See Comments)   Hypokalemia   Lotensin [benazepril Hcl] Hives   Dipyridamole Other (See Comments)   Unknown reaction   Estrogens Other (See Comments)   Unknown reaction   Hydrochlorothiazide Other (See Comments)   Unknown reaction   Latex Other (See Comments)   Unknown reaction - stated previously during surgery gloves had to be  changed, but unsure if it is still an allergy or not.    Metronidazole Other (See Comments)   Unknown reaction   Other Itching   PICKLES   Spironolactone Other (See Comments)   UNSPECIFIED > "kidney problems"   Sulfa Antibiotics Other (See Comments)   Unknown reaction   Torsemide Other (See Comments)   Unknown reaction   Valsartan Other (See Comments)   Unknown reaction   Mustard [allyl Isothiocyanate] Itching      Medication List    STOP taking these medications   Ecotrin Low Strength 81 MG EC tablet Generic drug: aspirin   furosemide 80 MG tablet Commonly known as: LASIX     TAKE these medications   (feeding supplement) PROSource Plus liquid Take 30 mLs by mouth 2 (two) times daily between meals.   acetaminophen 325 MG tablet Commonly known as: TYLENOL Take 2 tablets (650 mg total) by mouth  every 6 (six) hours as needed for moderate pain (or Fever >/= 101).   allopurinol 100 MG tablet Commonly known as: ZYLOPRIM Take 1 tablet (100 mg total) by mouth daily.   apixaban 5 MG Tabs tablet Commonly known as: ELIQUIS Continue with 10 mg twice daily until 05/19/2020 afterwards take 5 mg twice daily   budesonide-formoterol 160-4.5 MCG/ACT inhaler Commonly known as: Symbicort one - two inhalations every 12 hours; gargle and spit after use What changed:   how much to take  how to take this  when to take this  reasons to take this  additional instructions   calcium carbonate 500 MG chewable tablet Commonly known as: TUMS - dosed in mg elemental calcium Chew 2 tablets by mouth daily as needed for indigestion or heartburn.   carvedilol 12.5 MG tablet Commonly known as: COREG TAKE 1 TABLET(12.5 MG) BY MOUTH TWICE DAILY WITH A MEAL What changed:   how much to take  how to take this  when to take this  additional instructions   cholecalciferol 25 MCG (1000 UNIT) tablet Commonly known as: VITAMIN D3 Take 1 tablet (1,000 Units total) by mouth daily.   COD  LIVER OIL PO Take 1 capsule by mouth daily.   colchicine 0.6 MG tablet Take 0.5 tablets (0.3 mg total) by mouth daily.   cyanocobalamin 1000 MCG/ML injection Commonly known as: (VITAMIN B-12) INJECT 1ML INTO MUSCLE EVERY 30 DAYS What changed:   how much to take  how to take this  when to take this  additional instructions   diclofenac sodium 1 % Gel Commonly known as: VOLTAREN Apply 2 g topically 4 (four) times daily.   famotidine 20 MG tablet Commonly known as: Pepcid Take 1 tablet (20 mg total) by mouth 2 (two) times daily. What changed:   when to take this  reasons to take this   ferrous sulfate 325 (65 FE) MG tablet Take 1 tablet (325 mg total) by mouth every Monday, Wednesday, and Friday.   folic acid 923 MCG tablet Commonly known as: FOLVITE Take 1 tablet (400 mcg total) by mouth at bedtime. Take 400 mcg by mouth at bedtime.   gabapentin 100 MG capsule Commonly known as: Neurontin Take 1 capsule (100 mg total) by mouth 3 (three) times daily.   hydrOXYzine 25 MG tablet Commonly known as: ATARAX/VISTARIL Take 1 tablet (25 mg total) by mouth 3 (three) times daily as needed for itching.   montelukast 10 MG tablet Commonly known as: SINGULAIR Take 1 tablet (10 mg total) by mouth at bedtime.   Oxcarbazepine 300 MG tablet Commonly known as: TRILEPTAL Take 1 tablet (300 mg total) by mouth 2 (two) times daily.   oxyCODONE 5 MG immediate release tablet Commonly known as: Oxy IR/ROXICODONE Take 1 tablet (5 mg total) by mouth every 6 (six) hours as needed for up to 5 doses for severe pain (chronic knee and back pain). What changed: reasons to take this   polyethylene glycol 17 g packet Commonly known as: MIRALAX / GLYCOLAX Take 17 g by mouth daily.   potassium chloride SA 20 MEQ tablet Commonly known as: KLOR-CON Take 1 tablet (20 mEq total) by mouth 3 (three) times daily.   Pulmicort Flexhaler 90 MCG/ACT inhaler Generic drug: Budesonide Inhale 1 puff  into the lungs See admin instructions. 1 puff daily and 1 additional puff as needed wheezing or shortness of breath   saccharomyces boulardii 250 MG capsule Commonly known as: FLORASTOR Take 1 capsule (250 mg total)  by mouth 2 (two) times daily for 7 days.   senna-docusate 8.6-50 MG tablet Commonly known as: Senokot-S Take 1 tablet by mouth at bedtime as needed for mild constipation.   simvastatin 40 MG tablet Commonly known as: ZOCOR Take 1 tablet (40 mg total) by mouth at bedtime.   spironolactone 25 MG tablet Commonly known as: ALDACTONE Take 1 tablet (25 mg total) by mouth daily.   Torsemide 60 MG Tabs Take 60 mg by mouth 2 (two) times daily.   Ventolin HFA 108 (90 Base) MCG/ACT inhaler Generic drug: albuterol Inhale 2 puffs into the lungs every 4 (four) hours as needed for wheezing.            Durable Medical Equipment  (From admission, onward)         Start     Ordered   05/10/20 0859  For home use only DME oxygen  Once       Question Answer Comment  Length of Need Lifetime   Mode or (Route) Nasal cannula   Liters per Minute 2   Frequency Continuous (stationary and portable oxygen unit needed)   Oxygen conserving device Yes   Oxygen delivery system Gas      05/10/20 0859          Contact information for follow-up providers    [facility] Follow up on 06/11/2020.   Specialty: Cardiology Why: at 9:30 Located in the [facility] at [facility] information: [address] [id number] [place] [phone]       [name], MD Follow up in 1 week(s).   Specialty: Internal Medicine Contact information: [place] [address] [phone]            Contact information for after-discharge care    Destination    [facility] Preferred SNF .   Service: Skilled Nursing Contact information: [address] [address] [phone]                 Allergies  Allergen Reactions  . Penicillins Rash and Other (See Comments)    She was told not to take it anymore. Has patient had a PCN reaction causing immediate rash, facial/tongue/throat swelling, SOB or lightheadedness with hypotension: Yes Has patient had a PCN reaction causing severe rash involving mucus membranes or skin necrosis: No Has patient had a PCN reaction that required hospitalization: No Has patient had a PCN reaction occurring within the last 10 years: No If all of the above answers are "NO", then may proceed with Cephalosporin use.'  . Shellfish Allergy Anaphylaxis  . Sulfonamide Derivatives Anaphylaxis and Other (See Comments)  . Hydrochlorothiazide W-Triamterene Other (See Comments)    Hypokalemia  . Lotensin [Benazepril Hcl] Hives  . Dipyridamole Other (See Comments)    Unknown reaction  . Estrogens Other (See Comments)    Unknown reaction  . Hydrochlorothiazide Other (See Comments)    Unknown reaction  . Latex Other (See Comments)    Unknown reaction - stated previously during surgery gloves had to be changed, but unsure if it is still an allergy or not.   . Metronidazole Other (See Comments)    Unknown reaction  . Other Itching    PICKLES  . Spironolactone Other (See Comments)    UNSPECIFIED > "kidney problems"  . Sulfa Antibiotics Other (See Comments)    Unknown reaction  . Torsemide Other (See  Comments)    Unknown reaction  . Valsartan Other (See Comments)    Unknown reaction  . Madelaine Bhat Isothiocyanate] Itching    The results of significant diagnostics from this hospitalization (including imaging, microbiology, ancillary and laboratory) are listed below for reference.    Microbiology: Recent Results (from the past 240 hour(s))  Resp Panel by RT-PCR (Flu A&B, Covid) Nasopharyngeal Swab     Status: None   Collection Time: 05/07/20 11:56 PM   Specimen: Nasopharyngeal  Swab; Nasopharyngeal(NP) swabs in vial transport medium  Result Value Ref Range Status   SARS Coronavirus 2 by RT PCR NEGATIVE NEGATIVE Final    Comment: (NOTE) SARS-CoV-2 target nucleic acids are NOT DETECTED.  The SARS-CoV-2 RNA is generally detectable in upper respiratory specimens during the acute phase of infection. The lowest concentration of SARS-CoV-2 viral copies this assay can detect is 138 copies/mL. A negative result does not preclude SARS-Cov-2 infection and should not be used as the sole basis for treatment or other patient management decisions. A negative result may occur with  improper specimen collection/handling, submission of specimen other than nasopharyngeal swab, presence of viral mutation(s) within the areas targeted by this assay, and inadequate number of viral copies(<138 copies/mL). A negative result must be combined with clinical observations, patient history, and epidemiological information. The expected result is Negative.  Fact Sheet for Patients:  EntrepreneurPulse.com.au  Fact Sheet for Healthcare Providers:  IncredibleEmployment.be  This test is no t yet approved or cleared by the [place] FDA and  has been authorized for detection and/or diagnosis of SARS-CoV-2 by FDA under an Emergency Use Authorization (EUA). This EUA will remain  in effect (meaning this test can be used) for the duration of the COVID-19 declaration under Section 564(b)(1) of the Act, 21 U.S.C.section 360bbb-3(b)(1), unless the authorization is terminated  or revoked sooner.       Influenza A by PCR NEGATIVE NEGATIVE Final   Influenza B by PCR NEGATIVE NEGATIVE Final    Comment: (NOTE) The Xpert Xpress SARS-CoV-2/FLU/RSV plus assay is intended as an aid in the diagnosis of influenza from Nasopharyngeal swab specimens and should not be used as a sole basis for treatment. Nasal washings and aspirates are unacceptable for Xpert Xpress  SARS-CoV-2/FLU/RSV testing.  Fact Sheet for Patients: EntrepreneurPulse.com.au  Fact Sheet for Healthcare Providers: IncredibleEmployment.be  This test is not yet approved or cleared by the [place] FDA and has been authorized for detection and/or diagnosis of SARS-CoV-2 by FDA under an Emergency Use Authorization (EUA). This EUA will remain in effect (meaning this test can be used) for the duration of the COVID-19 declaration under Section 564(b)(1) of the Act, 21 U.S.C. section 360bbb-3(b)(1), unless the authorization is terminated or revoked.  Performed at [facility], [place] [address] [place], [place] [address]   SARS CORONAVIRUS 2 (TAT 6-24 HRS) Nasopharyngeal Nasopharyngeal Swab     Status: None   Collection Time: 05/13/20  8:00 PM   Specimen: Nasopharyngeal Swab  Result Value Ref Range Status   SARS Coronavirus 2 NEGATIVE NEGATIVE Final    Comment: (NOTE) SARS-CoV-2 target nucleic acids are NOT DETECTED.  The SARS-CoV-2 RNA is generally detectable in upper and lower respiratory specimens during the acute phase of infection. Negative results do not preclude SARS-CoV-2 infection, do not rule out co-infections with other pathogens, and should not be used as the sole basis for treatment or other patient management decisions. Negative results must be combined with clinical observations, patient history, and epidemiological information.  The expected result is Negative.  Fact Sheet for Patients: SugarRoll.be  Fact Sheet for Healthcare Providers: https://www.woods-mathews.com/  This test is not yet approved or cleared by the [place] FDA and  has been authorized for detection and/or diagnosis of SARS-CoV-2 by FDA under an Emergency Use Authorization (EUA). This EUA will remain  in effect (meaning this test can be used) for the duration of the COVID-19 declaration under Se ction  564(b)(1) of the Act, 21 U.S.C. section 360bbb-3(b)(1), unless the authorization is terminated or revoked sooner.  Performed at [facility], [place] [address], [place], [place] [address]     Procedures/Studies: DG Chest 2 View  Result Date: 05/07/2020 CLINICAL DATA:  Short of breath.  History CHF EXAM: CHEST - 2 VIEW COMPARISON:  02/23/2019 FINDINGS: Heart size within normal limits. Negative for heart failure or edema. No effusion. Mild bibasilar atelectasis. IMPRESSION: Mild bibasilar atelectasis. Electronically Signed   By: Franchot Gallo M.D.   On: 05/07/2020 19:05   CT Angio Chest PE W and/or Wo Contrast  Result Date: 05/07/2020 CLINICAL DATA:  Shortness of breath EXAM: CT ANGIOGRAPHY CHEST WITH CONTRAST TECHNIQUE: Multidetector CT imaging of the chest was performed using the standard protocol during bolus administration of intravenous contrast. Multiplanar CT image reconstructions and MIPs were obtained to evaluate the vascular anatomy. CONTRAST:  156mL OMNIPAQUE IOHEXOL 350 MG/ML SOLN COMPARISON:  Radiograph same day FINDINGS: Cardiovascular: There is slightly suboptimal opacification of the main pulmonary artery however no central or proximal segmental pulmonary embolism is seen. There is prominence of the right main pulmonary artery which could be due to pulmonary arterial hypertension. There is mild cardiomegaly. Coronary artery calcifications are seen. No pericardial effusion or thickening. No evidence right heart strain. There is normal three-vessel brachiocephalic anatomy without proximal stenosis. Scattered aortic atherosclerosis is noted. Mediastinum/Nodes: No hilar, mediastinal, or axillary adenopathy. Thyroid gland, trachea, and esophagus demonstrate no significant findings. Lungs/Pleura: Minimal ground-glass opacity seen at both lung bases. No pleural effusion or pneumothorax. No airspace consolidation. Upper Abdomen: No acute abnormalities present in the visualized portions of the  upper abdomen. A small hiatal hernia is present. Musculoskeletal: No chest wall abnormality. No acute or significant osseous findings. Degenerative changes are seen in midthoracic spine. Review of the MIP images confirms the above findings. IMPRESSION: Slightly suboptimal opacification of the main pulmonary artery however no central or proximal segmental pulmonary embolism Mild cardiomegaly in findings suggestive of pulmonary arterial hypertension Minimal ground-glass opacity at both lung bases which may be due to atelectasis Small hiatal hernia Aortic Atherosclerosis (ICD10-I70.0). Electronically Signed   By: Prudencio Pair M.D.   On: 05/07/2020 23:59   CT HUMERUS RIGHT WO CONTRAST  Result Date: 05/11/2020 CLINICAL DATA:  Right upper arm bruising. EXAM: CT OF THE RIGHT HUMERUS WITHOUT CONTRAST TECHNIQUE: Multidetector CT imaging was performed according to the standard protocol. Multiplanar CT image reconstructions were also generated. COMPARISON:  None. FINDINGS: Bones/Joint/Cartilage No fracture or dislocation. Normal alignment. No joint effusion. Ligaments Ligaments are suboptimally evaluated by CT. Muscles and Tendons Grossly intact. Soft tissue No fluid collection or hematoma.  No soft tissue mass. IMPRESSION: 1. No acute abnormality. Electronically Signed   By: Titus Dubin M.D.   On: 05/11/2020 16:24   ECHOCARDIOGRAM COMPLETE  Result Date: 05/10/2020    ECHOCARDIOGRAM REPORT   Patient Name:   April [name] Date of Exam: 05/10/2020 Medical Rec #:  [id number]         Height:  64.0 in Accession #:    [id number]        Weight:       384.9 lb Date of Birth:  04-Mar-1950          BSA:          2.584 m Patient Age:    53 years          BP:           95/80 mmHg Patient Gender: F                 HR:           65 bpm. Exam Location:  Inpatient Procedure: 2D Echo, Cardiac Doppler and Color Doppler Indications:    CHF  History:        Patient has prior history of Echocardiogram examinations, most                  recent 04/13/2015. CHF, CAD, Signs/Symptoms:Shortness of Breath;                 Risk Factors:Diabetes and Dyslipidemia.  Sonographer:    [place] Referring Phys: [id number] [place]  1. Left ventricular ejection fraction, by estimation, is 60 to 65%. The left ventricle has normal function. The left ventricle has no regional wall motion abnormalities. There is mild left ventricular hypertrophy. Left ventricular diastolic parameters are consistent with Grade II diastolic dysfunction (pseudonormalization). Elevated left atrial pressure.  2. Right ventricular systolic function is normal. The right ventricular size is mildly enlarged. There is severely elevated pulmonary artery systolic pressure. The estimated right ventricular systolic pressure is 01.0 mmHg.  3. The mitral valve is normal in structure. No evidence of mitral valve regurgitation.  4. The aortic valve is tricuspid. Aortic valve regurgitation is not visualized. Mild aortic valve sclerosis is present, with no evidence of aortic valve stenosis.  5. The inferior vena cava is dilated in size with <50% respiratory variability, suggesting right atrial pressure of 15 mmHg. FINDINGS  Left Ventricle: Left ventricular ejection fraction, by estimation, is 60 to 65%. The left ventricle has normal function. The left ventricle has no regional wall motion abnormalities. The left ventricular internal cavity size was normal in size. There is  mild left ventricular hypertrophy. Left ventricular diastolic parameters are consistent with Grade II diastolic dysfunction (pseudonormalization). Elevated left atrial pressure. Right Ventricle: The right ventricular size is mildly enlarged. Right vetricular wall thickness was not well visualized. Right ventricular systolic function is normal. There is severely elevated pulmonary artery systolic pressure. The tricuspid regurgitant velocity is 3.58 m/s, and with an assumed right atrial pressure of 15 mmHg, the  estimated right ventricular systolic pressure is 93.2 mmHg. Left Atrium: Left atrial size was normal in size. Right Atrium: Right atrial size was not well visualized. Pericardium: There is no evidence of pericardial effusion. Mitral Valve: The mitral valve is normal in structure. No evidence of mitral valve regurgitation. Tricuspid Valve: The tricuspid valve is normal in structure. Tricuspid valve regurgitation is trivial. Aortic Valve: The aortic valve is tricuspid. Aortic valve regurgitation is not visualized. Mild aortic valve sclerosis is present, with no evidence of aortic valve stenosis. Aortic valve mean gradient measures 5.0 mmHg. Aortic valve peak gradient measures 8.9 mmHg. Aortic valve area, by VTI measures 1.40 cm. Pulmonic Valve: The pulmonic valve was not well visualized. Pulmonic valve regurgitation is not visualized. Aorta: The aortic root and ascending aorta are structurally normal, with no evidence of dilitation.  Venous: The inferior vena cava is dilated in size with less than 50% respiratory variability, suggesting right atrial pressure of 15 mmHg. IAS/Shunts: The interatrial septum was not well visualized.  LEFT VENTRICLE PLAX 2D LVIDd:         4.50 cm     Diastology LVIDs:         2.40 cm     LV e' medial:    4.95 cm/s LV PW:         0.80 cm     LV E/e' medial:  21.8 LV IVS:        1.00 cm     LV e' lateral:   7.14 cm/s LVOT diam:     1.60 cm     LV E/e' lateral: 15.1 LV SV:         50 LV SV Index:   19 LVOT Area:     2.01 cm  LV Volumes (MOD) LV vol d, MOD A4C: 66.9 ml LV vol s, MOD A4C: 14.6 ml LV SV MOD A4C:     66.9 ml RIGHT VENTRICLE RV S prime:     11.80 cm/s  PULMONARY VEINS TAPSE (M-mode): 2.1 cm      A Reversal Duration: 87.00 msec                             A Reversal Velocity: 26.60 cm/s                             Diastolic Velocity:  66.06 cm/s                             S/D Velocity:        1.40                             Systolic Velocity:   30.16 cm/s LEFT ATRIUM            Index LA diam:      3.30 cm 1.28 cm/m LA Vol (A4C): 73.6 ml 28.48 ml/m  AORTIC VALVE                    PULMONIC VALVE AV Area (Vmax):    1.39 cm     PV Vmax:       1.04 m/s AV Area (Vmean):   1.40 cm     PV Vmean:      77.600 cm/s AV Area (VTI):     1.40 cm     PV VTI:        0.244 m AV Vmax:           149.00 cm/s  PV Peak grad:  4.3 mmHg AV Vmean:          110.000 cm/s PV Mean grad:  3.0 mmHg AV VTI:            0.358 m AV Peak Grad:      8.9 mmHg AV Mean Grad:      5.0 mmHg LVOT Vmax:         103.00 cm/s LVOT Vmean:        76.400 cm/s LVOT VTI:          0.250 m LVOT/AV VTI ratio: 0.70  AORTA Ao Root diam: 2.70 cm Ao Asc diam:  3.40 cm MITRAL VALVE                TRICUSPID VALVE MV Area (PHT): 2.87 cm     TR Peak grad:   51.3 mmHg MV Decel Time: 264 msec     TR Vmax:        358.00 cm/s MV E velocity: 108.00 cm/s MV A velocity: 96.70 cm/s   SHUNTS MV E/A ratio:  1.12         Systemic VTI:  0.25 m                             Systemic Diam: 1.60 cm Oswaldo Milian MD Electronically signed by Oswaldo Milian MD Signature Date/Time: 05/10/2020/7:29:58 PM    Final    [place] RT UPPER EXTREM LTD SOFT TISSUE NON VASCULAR  Result Date: 05/10/2020 CLINICAL DATA:  Right upper arm cellulitis EXAM: ULTRASOUND right UPPER EXTREMITY LIMITED TECHNIQUE: Ultrasound examination of the upper extremity soft tissues was performed in the area of clinical concern. COMPARISON:  None. FINDINGS: Today's sonogram of the area of concern along the back of the right upper arm demonstrates no soft tissue mass, underlying cyst, or appreciable abscess in the visualized soft tissues. IMPRESSION: 1. No abscess, cystic lesion, or soft tissue mass is sonographically apparent in the region of concern. Electronically Signed   By: Van Clines M.D.   On: 05/10/2020 09:50   VAS [place] LOWER EXTREMITY VENOUS (DVT) (ONLY [facility] & [facility])  Result Date: 05/08/2020  Lower Venous DVT Study Indications: Pain, Swelling, and SOB.  Limitations: Body  habitus, patient pain tolerance, and limited patient mobility. Comparison Study: 11-13-2018 Bilateral lower extremity venous was limited, but                   negative for DVT. Performing Technologist: Darlin Coco RDMS,RVT  Examination Guidelines: A complete evaluation includes B-mode imaging, spectral Doppler, color Doppler, and power Doppler as needed of all accessible portions of each vessel. Bilateral testing is considered an integral part of a complete examination. Limited examinations for reoccurring indications may be performed as noted. The reflux portion of the exam is performed with the patient in reverse Trendelenburg.  +---------+---------------+---------+-----------+----------+-------------------+ RIGHT    CompressibilityPhasicitySpontaneityPropertiesThrombus Aging      +---------+---------------+---------+-----------+----------+-------------------+ CFV      Full           Yes      Yes                                      +---------+---------------+---------+-----------+----------+-------------------+ SFJ      Full                                                             +---------+---------------+---------+-----------+----------+-------------------+ FV Prox  Full                                                             +---------+---------------+---------+-----------+----------+-------------------+ FV Mid   Full  Yes      Yes                                      +---------+---------------+---------+-----------+----------+-------------------+ FV Distal                                             Unable to visualize                                                       due to pain                                                               tolerance/tissue                                                          properties          +---------+---------------+---------+-----------+----------+-------------------+ PFV       Full                                                             +---------+---------------+---------+-----------+----------+-------------------+ POP                                                   Unable to visualize                                                       due to limited                                                            patient mobility    +---------+---------------+---------+-----------+----------+-------------------+ PTV      None           No       No                   Age Indeterminate   +---------+---------------+---------+-----------+----------+-------------------+ PERO  Not well visualized +---------+---------------+---------+-----------+----------+-------------------+   +---------+---------------+---------+-----------+----------+-----------------+ LEFT     CompressibilityPhasicitySpontaneityPropertiesThrombus Aging    +---------+---------------+---------+-----------+----------+-----------------+ CFV      Full           Yes      Yes                                    +---------+---------------+---------+-----------+----------+-----------------+ SFJ      Full                                                           +---------+---------------+---------+-----------+----------+-----------------+ FV Prox  Full                                                           +---------+---------------+---------+-----------+----------+-----------------+ FV Mid   Full           Yes      Yes                                    +---------+---------------+---------+-----------+----------+-----------------+ FV Distal               Yes      Yes                                    +---------+---------------+---------+-----------+----------+-----------------+ PFV      Full                                                            +---------+---------------+---------+-----------+----------+-----------------+ POP      Full           Yes      Yes                                    +---------+---------------+---------+-----------+----------+-----------------+ PTV      Partial        Yes      Yes                  Age Indeterminate +---------+---------------+---------+-----------+----------+-----------------+ PERO                    Yes      Yes                                    +---------+---------------+---------+-----------+----------+-----------------+     Summary: RIGHT: - Findings consistent with age indeterminate deep vein thrombosis involving the right posterior tibial veins. - No cystic structure found in the popliteal fossa.  LEFT: - Findings consistent with age indeterminate deep vein thrombosis involving the left posterior  tibial veins. - No cystic structure found in the popliteal fossa.  *See table(s) above for measurements and observations. Electronically signed by Harold Barban MD on 05/08/2020 at 9:59:32 PM.    Final     Labs: BNP (last 3 results) Recent Labs    10/20/19 1226 05/07/20 1754  BNP 51.9 54.0   Basic Metabolic Panel: Recent Labs  Lab 05/10/20 0844 05/11/20 0347 05/12/20 0541 05/13/20 0334 05/15/20 0428  NA 139 137 137 137 136  K 4.0 4.1 3.8 3.9 3.5  CL 99 94* 90* 90* 86*  CO2 32 33* 39* 41* 43*  GLUCOSE 164* 98 132* 99 105*  BUN 20 25* 25* 27* 38*  CREATININE 1.62* 1.75* 1.55* 1.56* 1.50*  CALCIUM 8.5* 8.9 8.6* 8.8* 8.9   Liver Function Tests: Recent Labs  Lab 05/09/20 0434  AST 17  ALT 16  ALKPHOS 73  BILITOT 0.9  PROT 5.7*  ALBUMIN 3.0*   No results for input(s): LIPASE, AMYLASE in the last 168 hours. No results for input(s): AMMONIA in the last 168 hours. CBC: Recent Labs  Lab 05/10/20 0844 05/11/20 0347 05/12/20 0541 05/13/20 0334 05/15/20 0428  WBC 4.6 5.1 4.1 4.3 4.6  HGB 11.0* 11.5* 10.9* 10.7* 10.4*  HCT 36.3 37.6 35.8* 34.6* 33.3*  MCV 99.7  98.9 100.8* 97.2 98.2  PLT 131* 142* 129* 129* 123*   Cardiac Enzymes: No results for input(s): CKTOTAL, CKMB, CKMBINDEX, TROPONINI in the last 168 hours. BNP: Invalid input(s): POCBNP CBG: Recent Labs  Lab 05/08/20 2151  GLUCAP 107*   D-Dimer No results for input(s): DDIMER in the last 72 hours. Hgb A1c No results for input(s): HGBA1C in the last 72 hours. Lipid Profile No results for input(s): CHOL, HDL, LDLCALC, TRIG, CHOLHDL, LDLDIRECT in the last 72 hours. Thyroid function studies No results for input(s): TSH, T4TOTAL, T3FREE, THYROIDAB in the last 72 hours.  Invalid input(s): FREET3 Anemia work up No results for input(s): VITAMINB12, FOLATE, FERRITIN, TIBC, IRON, RETICCTPCT in the last 72 hours. Urinalysis    Component Value Date/Time   COLORURINE STRAW (A) 02/15/2019 1856   APPEARANCEUR CLEAR 02/15/2019 1856   LABSPEC 1.006 02/15/2019 1856   PHURINE 8.0 02/15/2019 1856   GLUCOSEU NEGATIVE 02/15/2019 1856   GLUCOSEU NEGATIVE 02/19/2015 1707   HGBUR NEGATIVE 02/15/2019 1856   BILIRUBINUR NEGATIVE 02/15/2019 1856   BILIRUBINUR negative 09/05/2013 0832   KETONESUR NEGATIVE 02/15/2019 1856   PROTEINUR NEGATIVE 02/15/2019 1856   UROBILINOGEN 2.0 (A) 02/19/2015 1707   NITRITE NEGATIVE 02/15/2019 1856   LEUKOCYTESUR NEGATIVE 02/15/2019 1856   Sepsis Labs Invalid input(s): PROCALCITONIN,  WBC,  LACTICIDVEN Microbiology Recent Results (from the past 240 hour(s))  Resp Panel by RT-PCR (Flu A&B, Covid) Nasopharyngeal Swab     Status: None   Collection Time: 05/07/20 11:56 PM   Specimen: Nasopharyngeal Swab; Nasopharyngeal(NP) swabs in vial transport medium  Result Value Ref Range Status   SARS Coronavirus 2 by RT PCR NEGATIVE NEGATIVE Final    Comment: (NOTE) SARS-CoV-2 target nucleic acids are NOT DETECTED.  The SARS-CoV-2 RNA is generally detectable in upper respiratory specimens during the acute phase of infection. The lowest concentration of SARS-CoV-2 viral  copies this assay can detect is 138 copies/mL. A negative result does not preclude SARS-Cov-2 infection and should not be used as the sole basis for treatment or other patient management decisions. A negative result may occur with  improper specimen collection/handling, submission of specimen other than nasopharyngeal swab, presence of viral mutation(s) within the areas  targeted by this assay, and inadequate number of viral copies(<138 copies/mL). A negative result must be combined with clinical observations, patient history, and epidemiological information. The expected result is Negative.  Fact Sheet for Patients:  EntrepreneurPulse.com.au  Fact Sheet for Healthcare Providers:  IncredibleEmployment.be  This test is no t yet approved or cleared by the [place] FDA and  has been authorized for detection and/or diagnosis of SARS-CoV-2 by FDA under an Emergency Use Authorization (EUA). This EUA will remain  in effect (meaning this test can be used) for the duration of the COVID-19 declaration under Section 564(b)(1) of the Act, 21 U.S.C.section 360bbb-3(b)(1), unless the authorization is terminated  or revoked sooner.       Influenza A by PCR NEGATIVE NEGATIVE Final   Influenza B by PCR NEGATIVE NEGATIVE Final    Comment: (NOTE) The Xpert Xpress SARS-CoV-2/FLU/RSV plus assay is intended as an aid in the diagnosis of influenza from Nasopharyngeal swab specimens and should not be used as a sole basis for treatment. Nasal washings and aspirates are unacceptable for Xpert Xpress SARS-CoV-2/FLU/RSV testing.  Fact Sheet for Patients: EntrepreneurPulse.com.au  Fact Sheet for Healthcare Providers: IncredibleEmployment.be  This test is not yet approved or cleared by the [place] FDA and has been authorized for detection and/or diagnosis of SARS-CoV-2 by FDA under an Emergency Use Authorization (EUA). This  EUA will remain in effect (meaning this test can be used) for the duration of the COVID-19 declaration under Section 564(b)(1) of the Act, 21 U.S.C. section 360bbb-3(b)(1), unless the authorization is terminated or revoked.  Performed at [facility], [place] [address], [place], [place] [address]   SARS CORONAVIRUS 2 (TAT 6-24 HRS) Nasopharyngeal Nasopharyngeal Swab     Status: None   Collection Time: 05/13/20  8:00 PM   Specimen: Nasopharyngeal Swab  Result Value Ref Range Status   SARS Coronavirus 2 NEGATIVE NEGATIVE Final    Comment: (NOTE) SARS-CoV-2 target nucleic acids are NOT DETECTED.  The SARS-CoV-2 RNA is generally detectable in upper and lower respiratory specimens during the acute phase of infection. Negative results do not preclude SARS-CoV-2 infection, do not rule out co-infections with other pathogens, and should not be used as the sole basis for treatment or other patient management decisions. Negative results must be combined with clinical observations, patient history, and epidemiological information. The expected result is Negative.  Fact Sheet for Patients: SugarRoll.be  Fact Sheet for Healthcare Providers: https://www.woods-mathews.com/  This test is not yet approved or cleared by the [place] FDA and  has been authorized for detection and/or diagnosis of SARS-CoV-2 by FDA under an Emergency Use Authorization (EUA). This EUA will remain  in effect (meaning this test can be used) for the duration of the COVID-19 declaration under Se ction 564(b)(1) of the Act, 21 U.S.C. section 360bbb-3(b)(1), unless the authorization is terminated or revoked sooner.  Performed at [facility], [place] [address], [place], [place] [address]      Time coordinating discharge: 35 minutes  SIGNED: Antonieta Pert, MD  [facility] 05/15/2020, 3:52 PM  If 7PM-7AM, please contact night-coverage [email or web address]

## 2020-05-16 DIAGNOSIS — E1142 Type 2 diabetes mellitus with diabetic polyneuropathy: Secondary | ICD-10-CM | POA: Diagnosis not present

## 2020-05-16 DIAGNOSIS — N1832 Chronic kidney disease, stage 3b: Secondary | ICD-10-CM | POA: Diagnosis not present

## 2020-05-16 DIAGNOSIS — I10 Essential (primary) hypertension: Secondary | ICD-10-CM | POA: Diagnosis not present

## 2020-05-17 ENCOUNTER — Ambulatory Visit: Payer: Medicare PPO | Admitting: Internal Medicine

## 2020-05-22 DIAGNOSIS — R5381 Other malaise: Secondary | ICD-10-CM | POA: Diagnosis not present

## 2020-05-22 DIAGNOSIS — I5033 Acute on chronic diastolic (congestive) heart failure: Secondary | ICD-10-CM | POA: Diagnosis not present

## 2020-05-22 DIAGNOSIS — R0602 Shortness of breath: Secondary | ICD-10-CM | POA: Diagnosis not present

## 2020-05-22 DIAGNOSIS — G894 Chronic pain syndrome: Secondary | ICD-10-CM | POA: Diagnosis not present

## 2020-05-22 DIAGNOSIS — M130 Polyarthritis, unspecified: Secondary | ICD-10-CM | POA: Diagnosis not present

## 2020-05-22 DIAGNOSIS — R600 Localized edema: Secondary | ICD-10-CM | POA: Diagnosis not present

## 2020-05-22 DIAGNOSIS — M25561 Pain in right knee: Secondary | ICD-10-CM | POA: Diagnosis not present

## 2020-05-23 NOTE — Progress Notes (Signed)
Subjective:    Patient ID: April [name], female    DOB: 1950/04/29, 70 y.o.   MRN: [id number]  HPI The patient is here for follow up of their chronic medical problems.  Since she was here she was hospitalized for cellulitis and was found to have b/l LE DVT.  She presented to the ED with SOB, leg pain, increased difficulty walking, wheeze, cough, weight gain.  She was hypoxic down to 85% in ED.     B/l LE DVT with ? Cellulitis: Cellulitis ruled out Started on Eliquis 5 mg BID x 3-6 months - should see hem to determine further duration  RUE bruise/swelling: ? Trauma related, ? 2/2 lovenox Ct showed no acute finding or abscess Started on empiric doxycycline and completed course  Acute hypoxic resp failure: multifactorial - acute on chronic diastolic HF, obesity, hypoventilation/OSA and secondary pulm htn Ct angio - neg for PE  Acute on chronic diastolic HF: BNP normal  - in setting of obesity but was very fluid overloaded.  Treated w/ IV lasix On torsemide 60 mg BID   Chronic knee pain, neuropathy - on oxycodone.    She is doing PT, OT daily.  She does feel like she is making progress with PT and OT  She wears the oxygen during the day - not at night.  She is trying to wean off the oxygen.  She is on 2 L/min via [place].    Her weight has gone up since being at rehab.  Her legs are more swollen.  She is not eating much.  She is concerned about having the fluid accumulate again.  ? Getting torsemide.  Her med list from rehab shows that her torsemide is on hold.  She does look like she is getting the spironolactone.   RUE bruising/redness is better.  She states it still little sore, but she cannot see it to judge.  Her husband states it is definitely better.  Medications and allergies reviewed with patient and updated if appropriate.  Patient Active Problem List   Diagnosis Date Noted  . Cellulitis, leg 05/08/2020  . Leg DVT (deep venous thromboembolism), acute, bilateral  ([place]) 05/08/2020  . Acute respiratory failure with hypoxia and hypercapnia ([place]) 05/08/2020  . SOB (shortness of breath)   . Acute idiopathic gout of right foot   . Morbid obesity ([place])   . Idiopathic chronic gout of multiple sites without tophus   . Cellulitis 02/02/2019  . Pneumonia due to COVID-19 virus 12/10/2018  . COVID-19 virus infection 12/06/2018  . Right hip pain 11/09/2018  . Osteomyelitis of ankle or foot, acute, left ([place]) 09/07/2018  . Osteomyelitis of ankle or foot, left, acute ([place]) 09/07/2018  . Onychomycosis 05/19/2018  . Idiopathic chronic venous hypertension of right lower extremity with ulcer and inflammation ([place]) 05/19/2018  . Skin lumps 01/14/2018  . Bilateral leg cramps 12/14/2017  . Left shoulder pain 08/14/2016  . Meralgia paresthetica 07/16/2016  . Chronic left-sided low back pain with left-sided sciatica 06/02/2016  . Whole body pain 03/20/2016  . Osteopenia 01/11/2016  . CKD (chronic kidney disease) stage 3, GFR 30-59 ml/min ([facility]) 01/02/2016  . Hand paresthesia 07/18/2015  . Carpal tunnel syndrome 06/07/2015  . Family history of colon cancer   . Diabetes mellitus with neurological manifestations ([place]) 04/18/2015  . Bilateral leg edema 04/11/2015  . Abnormality of gait 01/03/2015  . Hereditary and idiopathic peripheral neuropathy 01/03/2015  . GERD (gastroesophageal reflux disease) 06/17/2014  . Hx of colonic  polyps 12/15/2012  . Vitamin B12 deficiency 11/03/2012  . Intrinsic asthma 03/23/2012  . Chronic diastolic heart failure ([place]) 02/20/2011  . OSA (obstructive sleep apnea) 09/17/2010  . URINARY URGENCY 01/08/2010  . CAD, NATIVE VESSEL 11/20/2008  . Osteoarthritis of knees, bilateral 06/14/2008  . Hyperlipidemia 05/10/2007  . Essential hypertension 01/18/2007  . Hypokalemia 04/30/2006  . HX, PERSONAL, PEPTIC ULCER DISEASE 04/30/2006    Current Outpatient Medications on File Prior to Visit  Medication Sig Dispense Refill  . acetaminophen  (TYLENOL) 325 MG tablet Take 2 tablets (650 mg total) by mouth every 6 (six) hours as needed for moderate pain (or Fever >/= 101). 20 tablet 0  . allopurinol (ZYLOPRIM) 100 MG tablet Take 1 tablet (100 mg total) by mouth daily. 90 tablet 1  . apixaban (ELIQUIS) 5 MG TABS tablet Continue with 10 mg twice daily until 05/19/2020 afterwards take 5 mg twice daily 60 tablet   . Budesonide (PULMICORT FLEXHALER) 90 MCG/ACT inhaler Inhale 1 puff into the lungs See admin instructions. 1 puff daily and 1 additional puff as needed wheezing or shortness of breath    . budesonide-formoterol (SYMBICORT) 160-4.5 MCG/ACT inhaler one - two inhalations every 12 hours; gargle and spit after use (Patient taking differently: Inhale 2 puffs into the lungs 2 (two) times daily as needed (wheezing). gargle and spit after use) 1 Inhaler 4  . calcium carbonate (TUMS - DOSED IN MG ELEMENTAL CALCIUM) 500 MG chewable tablet Chew 2 tablets by mouth daily as needed for indigestion or heartburn.    . carvedilol (COREG) 12.5 MG tablet TAKE 1 TABLET(12.5 MG) BY MOUTH TWICE DAILY WITH A MEAL (Patient taking differently: Take 12.5 mg by mouth 2 (two) times daily with a meal.) 180 tablet 0  . cholecalciferol (VITAMIN D3) 25 MCG (1000 UNIT) tablet Take 1 tablet (1,000 Units total) by mouth daily. 90 tablet 1  . COD LIVER OIL PO Take 1 capsule by mouth daily.    . colchicine 0.6 MG tablet Take 0.5 tablets (0.3 mg total) by mouth daily. 90 tablet 1  . cyanocobalamin (,VITAMIN B-12,) 1000 MCG/ML injection INJECT 1ML INTO MUSCLE EVERY 30 DAYS (Patient taking differently: Inject 1,000 mcg into the muscle every 30 (thirty) days.) 10 mL 0  . diclofenac sodium (VOLTAREN) 1 % GEL Apply 2 g topically 4 (four) times daily. 100 g 11  . famotidine (PEPCID) 20 MG tablet Take 1 tablet (20 mg total) by mouth 2 (two) times daily. (Patient taking differently: Take 20 mg by mouth daily as needed for heartburn.) 180 tablet 3  . ferrous sulfate 325 (65 FE) MG  tablet Take 1 tablet (325 mg total) by mouth every Monday, Wednesday, and Friday. 60 tablet 0  . folic acid (FOLVITE) 419 MCG tablet Take 1 tablet (400 mcg total) by mouth at bedtime. Take 400 mcg by mouth at bedtime. 90 tablet 1  . hydrOXYzine (ATARAX/VISTARIL) 25 MG tablet Take 1 tablet (25 mg total) by mouth 3 (three) times daily as needed for itching. 30 tablet 0  . montelukast (SINGULAIR) 10 MG tablet Take 1 tablet (10 mg total) by mouth at bedtime. 30 tablet 5  . Nutritional Supplements (,FEEDING SUPPLEMENT, PROSOURCE PLUS) liquid Take 30 mLs by mouth 2 (two) times daily between meals.    . Oxcarbazepine (TRILEPTAL) 300 MG tablet Take 1 tablet (300 mg total) by mouth 2 (two) times daily. 60 tablet 0  . oxyCODONE (OXY IR/ROXICODONE) 5 MG immediate release tablet Take 1 tablet (5 mg total) by  mouth every 6 (six) hours as needed for up to 5 doses for severe pain (chronic knee and back pain). 5 tablet 0  . polyethylene glycol (MIRALAX / GLYCOLAX) 17 g packet Take 17 g by mouth daily. 14 each 0  . potassium chloride SA (KLOR-CON) 20 MEQ tablet Take 1 tablet (20 mEq total) by mouth 3 (three) times daily. 180 tablet 1  . senna-docusate (SENOKOT-S) 8.6-50 MG tablet Take 1 tablet by mouth at bedtime as needed for mild constipation. 30 tablet 0  . simvastatin (ZOCOR) 40 MG tablet Take 1 tablet (40 mg total) by mouth at bedtime. 90 tablet 1  . spironolactone (ALDACTONE) 25 MG tablet Take 1 tablet (25 mg total) by mouth daily. 90 tablet 0  . torsemide 60 MG TABS Take 60 mg by mouth 2 (two) times daily.    . VENTOLIN HFA 108 (90 Base) MCG/ACT inhaler Inhale 2 puffs into the lungs every 4 (four) hours as needed for wheezing.    . gabapentin (NEURONTIN) 100 MG capsule Take 1 capsule (100 mg total) by mouth 3 (three) times daily. 90 capsule 0   No current facility-administered medications on file prior to visit.    Past Medical History:  Diagnosis Date  . Anemia   . Asthma   . CAD (coronary artery  disease)   . Carpal tunnel syndrome   . Cellulitis of both lower extremities 04/11/2015  . CHF (congestive heart failure) ([place])   . Chronic kidney disease    [facility]- followed by [facility]  . Colon polyp, hyperplastic 2007 & 2012  . Complication of anesthesia 1999   svt with renal calculi surgery, no problems since  . CTS (carpal tunnel syndrome)    bilateral  . Diabetes mellitus   . Eczema   . GERD (gastroesophageal reflux disease)   . History of kidney stones 1999  . Hyperlipidemia   . Hypertension   . Leg ulcer ([place]) 04/24/2015   Right lateral leg No evidence of an infection Monitor closely Keep edema controlled   . Leg ulcer ([place])    right lower  . Meralgia paresthetica    Dr. [name]  . Morbid obesity ([facility])   . Morbid obesity ([place])   . Neuropathy    toes and legs  . Osteoarthrosis, unspecified whether generalized or localized, lower leg    knee  . PUD (peptic ulcer disease)   . Shortness of breath dyspnea    with exertion  . Sleep apnea    per progress note 02/25/2018  . Type II or unspecified type diabetes mellitus without mention of complication, not stated as uncontrolled   . Unspecified hereditary and idiopathic peripheral neuropathy   . Urticaria   . Vitamin B12 deficiency   . Wears glasses   . Wound cellulitis    right upper leg, healing well    Past Surgical History:  Procedure Laterality Date  . ABDOMINAL HYSTERECTOMY    . BREAST BIOPSY Right 07/08/2018  . CARDIAC CATHETERIZATION  2002   non obstructive disease  . colonoscopy with polypectomy  2007 & 2012    hyperplastic ;Dr Watt Climes  . COLONOSCOPY WITH PROPOFOL N/A 06/04/2015   Procedure: COLONOSCOPY WITH PROPOFOL;  Surgeon: Jerene Bears, MD;  Location: [facility];  Service: Gastroenterology;  Laterality: N/A;  . DEBRIDEMENT LEG Right 03/02/2018   WOUND VAC APPLIED  . DEBRIDEMENT LEG Right 05/27/2018   RIGHT LOWER LEG DEBRIDEMENT,  SKIN GRAFT, VAC PLACEMENT   . DILATION AND CURETTAGE OF UTERUS  multiple  . HEMORRHOID SURGERY    . I & D EXTREMITY Right 03/02/2018   Procedure: RIGHT LEG DEBRIDEMENT AND PLACE VAC;  Surgeon: [name], MD;  Location: [place];  Service: Orthopedics;  Laterality: Right;  . I & D EXTREMITY Right 03/04/2018   Procedure: REPEAT IRRIGATION AND DEBRIDEMENT RIGHT LEG, PLACE WOUND VAC;  Surgeon: [name], MD;  Location: [facility];  Service: Orthopedics;  Laterality: Right;  . I & D EXTREMITY Right 03/30/2018   Procedure: IRRIGATION AND DEBRIDEMENT RIGHT LEG, APPLY WOUND VAC;  Surgeon: [name], MD;  Location: [facility];  Service: Orthopedics;  Laterality: Right;  . I & D EXTREMITY Right 05/27/2018   Procedure: RIGHT LOWER LEG DEBRIDEMENT,  SKIN GRAFT, VAC PLACEMENT;  Surgeon: [name], MD;  Location: [facility];  Service: Orthopedics;  Laterality: Right;  RIGHT LOWER LEG DEBRIDEMENT,  SKIN GRAFT, VAC PLACEMENT  . renal calculi  12/1997   SVT with induction of anesthesia  . right knee arthroscopy    . SKIN SPLIT GRAFT Right 03/04/2018   Procedure: POSSIBLE SPLIT THICKNESS SKIN GRAFT;  Surgeon: [name], MD;  Location: [place];  Service: Orthopedics;  Laterality: Right;  . SKIN SPLIT GRAFT Right 04/01/2018   Procedure: REPEAT IRRIGATION AND DEBRIDEMENT RIGHT LEG, APPLY SPLIT THICKNESS SKIN GRAFT;  Surgeon: [name], MD;  Location: [facility];  Service: Orthopedics;  Laterality: Right;  . TONSILLECTOMY AND ADENOIDECTOMY      Social History   Socioeconomic History  . Marital status: Married    Spouse name: [name]  . Number of children: 1  . Years of education: BS  . Highest education level: Not on file  Occupational History  . Occupation: Disabled    Employer: RETIRED  Tobacco Use  . Smoking status: Never Smoker  . Smokeless tobacco: Never Used  Vaping Use  . Vaping Use: Never used  Substance and Sexual Activity  . Alcohol use: No    Alcohol/week: 0.0 standard drinks  . Drug use: No  . Sexual activity: Not on file  Other Topics Concern  .  Not on file  Social History Narrative   Patient lives at home with her husband [name] Patient is retired and has a Conservation officer, nature.    Caffeine - some times.   Right handed.   Social Determinants of Health   Financial Resource Strain: Not on file  Food Insecurity: Not on file  Transportation Needs: Not on file  Physical Activity: Not on file  Stress: Not on file  Social Connections: Not on file    Family History  Problem Relation Age of Onset  . Colon cancer Mother   . Prostate cancer Father   . Colon cancer Father   . Diabetes Maternal Aunt   . Breast cancer Maternal Aunt   . Diabetes Maternal Uncle   . Diabetes Paternal Aunt   . Stroke Paternal Aunt        > 65  . Heart disease Paternal Aunt   . Diabetes Paternal Uncle   . Breast cancer Maternal Aunt         X 2  . Breast cancer Cousin     Review of Systems  Constitutional: Negative for chills and fever.  Respiratory: Positive for cough, shortness of breath (with activity) and wheezing.   Cardiovascular: Positive for palpitations (little) and leg swelling. Negative for chest pain.  Gastrointestinal: Negative for abdominal pain (some from injections in hosp), blood in  stool, constipation, diarrhea and nausea.       GERD  Musculoskeletal: Positive for arthralgias.  Neurological: Positive for light-headedness (when she gets up). Negative for headaches.       Objective:   Vitals:   05/24/20 1252  BP: 126/84  Pulse: 73  Temp: 98.4 F (36.9 C)  SpO2: 93%   BP Readings from Last 3 Encounters:  05/24/20 126/84  05/15/20 (!) 105/51  03/08/20 140/76   Wt Readings from Last 3 Encounters:  05/14/20 (!) 362 lb 14 oz (164.6 kg)  10/20/19 (!) 340 lb (154.2 kg)  09/04/19 (!) 340 lb (154.2 kg)   Body mass index is 62.29 kg/m.   Physical Exam    Constitutional: Appears well-developed and well-nourished. No distress.  HENT:  Head: Normocephalic and atraumatic.  Neck: Neck supple. No tracheal deviation  present. No thyromegaly present.  No cervical lymphadenopathy Cardiovascular: Normal rate, regular rhythm and normal heart sounds.   No murmur heard. No carotid bruit .  1+ bilateral lower extremity edema Pulmonary/Chest: Effort normal and breath sounds normal. No respiratory distress. No has no wheezes. No rales.  Skin: Skin is warm and dry. Not diaphoretic.  Left lower abdomen-hematoma from injection while in hospital Psychiatric: Normal mood and affect. Behavior is normal.      Assessment & Plan:    See Problem List for Assessment and Plan of chronic medical problems.    This visit occurred during the SARS-CoV-2 public health emergency.  Safety protocols were in place, including screening questions prior to the visit, additional usage of staff PPE, and extensive cleaning of exam room while observing appropriate contact time as indicated for disinfecting solutions.

## 2020-05-23 NOTE — Patient Instructions (Addendum)
  Medications changes include :   Restart torsemide if on hold.  If already taking will need to consult Dr Sung Amabile for further advise.

## 2020-05-24 ENCOUNTER — Encounter: Payer: Self-pay | Admitting: Internal Medicine

## 2020-05-24 ENCOUNTER — Ambulatory Visit: Payer: Medicare PPO | Admitting: Pharmacist

## 2020-05-24 ENCOUNTER — Ambulatory Visit: Payer: Medicare PPO | Admitting: Internal Medicine

## 2020-05-24 ENCOUNTER — Other Ambulatory Visit: Payer: Self-pay

## 2020-05-24 VITALS — BP 126/84 | HR 73 | Temp 98.4°F | Ht 64.0 in

## 2020-05-24 DIAGNOSIS — N1832 Chronic kidney disease, stage 3b: Secondary | ICD-10-CM

## 2020-05-24 DIAGNOSIS — M170 Bilateral primary osteoarthritis of knee: Secondary | ICD-10-CM

## 2020-05-24 DIAGNOSIS — I10 Essential (primary) hypertension: Secondary | ICD-10-CM

## 2020-05-24 DIAGNOSIS — E782 Mixed hyperlipidemia: Secondary | ICD-10-CM | POA: Diagnosis not present

## 2020-05-24 DIAGNOSIS — I5032 Chronic diastolic (congestive) heart failure: Secondary | ICD-10-CM

## 2020-05-24 DIAGNOSIS — E1140 Type 2 diabetes mellitus with diabetic neuropathy, unspecified: Secondary | ICD-10-CM

## 2020-05-24 DIAGNOSIS — S301XXA Contusion of abdominal wall, initial encounter: Secondary | ICD-10-CM | POA: Diagnosis not present

## 2020-05-24 DIAGNOSIS — I82403 Acute embolism and thrombosis of unspecified deep veins of lower extremity, bilateral: Secondary | ICD-10-CM | POA: Diagnosis not present

## 2020-05-24 DIAGNOSIS — M8588 Other specified disorders of bone density and structure, other site: Secondary | ICD-10-CM

## 2020-05-24 NOTE — Assessment & Plan Note (Signed)
Chronic She does appear fluid overloaded-of concern she has gained weight and feels her legs are more swollen than they have been ?  If she truly receiving the torsemide twice daily it looks like it may be on hold-note written to rehab for them to check and if she is not taking it should be restarted Continue spironolactone Need to monitor weights Continue low-sodium diet If her weights continue to go up and her leg swelling continues to worsen I have advised her to contact the heart failure clinic for further advice

## 2020-05-24 NOTE — Assessment & Plan Note (Signed)
Chronic Blood pressure well controlled Continue carvedilol 12.5 mg twice daily, spironolactone 25 mg daily and torsemide 60 mg twice daily

## 2020-05-24 NOTE — Assessment & Plan Note (Signed)
Acute Diagnosed during her last hospitalization Continue Eliquis 5 mg twice daily When she is at a rehab and returns home I will refer her to hematology for further evaluation of duration and if she needs a low-dose long-term because of her increased risk of recurrent DVT

## 2020-05-24 NOTE — Assessment & Plan Note (Signed)
Chronic Lab Results  Component Value Date   HGBA1C 6.4 03/08/2020   Controlled with diet

## 2020-05-24 NOTE — Assessment & Plan Note (Signed)
Chronic Severe Following with orthopedics and getting periodic injections Continue oxycodone 5 mg every 6 hours as needed for pain

## 2020-05-24 NOTE — Assessment & Plan Note (Signed)
Chronic Stable Continue simvastatin 40 mg daily

## 2020-05-24 NOTE — Progress Notes (Signed)
Chronic Care Management - Non Eligible  Pharmacy Note  05/24/2020 Name:  April [name] MRN:  [id number] DOB:  July 29, 1950  Subjective: April [name] is an 70 y.o. year old female who is a primary patient of Burns, Claudina Lick, MD.  The CCM team was consulted for assistance with disease management and care coordination needs.    Engaged with patient face to face for initial visit in response to provider referral for pharmacy case management and/or care coordination services.   Patient Care Team: [name], MD as PCP - General (Internal Medicine)   Patient is currently in [place] center after hospitalization 4/5-4/13/22. This is after she had been home for just a few weeks following prolonged rehab stay after hospitalization Jan 2021. She is hoping to be released soon. Currently the rehab center is giving her all her medications. She is not sure if they have been giving her torsemide.  Patient is not sure what her medications are for and wants to make sure they are all necessary.  Recent office visits: 03/08/20 Dr Quay Burow OV: rehab f/u - been in rehab for months. Hospitalized 02/02/19-03/01/19 for cellulitis, then rehab for a year. Home nursing, PT, health aid ordered from rehab. Continue oxycodone for short term.  Recent consult visits: 10/20/19 Dr Haroldine Laws (HF clinic): initial visit for CHF. Discussed [employer]. No med changes.  09/14/19 Dr [name] (ortho): f/u R knee/ankle; s/p prolonged skin grafting for venous ulcerations. Injection given for pain.  Hospital visits: Medication Reconciliation was completed by comparing discharge summary, patient's EMR and Pharmacy list, and upon discussion with patient.  Admitted to the hospital on 05/07/20 due to DVT and HF exacerbation. Discharge date was 05/15/20. Discharged from [place]?Medications Started at [facility] Discharge:?? -started Eliquis 10 mg BID x 7 days then 5 mg BID. Duration to be determined by PCP  or hematology. -Florastor probiotic  Medication Changes at Hospital Discharge: -Changed furosemide to torsemide 60 mg BID  Medications Discontinued at Hospital Discharge: -Stopped aspirin due to Eliquis  Medications that remain the same after Hospital Discharge:??  -All other medications will remain the same.    Objective:  Lab Results  Component Value Date   CREATININE 1.50 (H) 05/15/2020   BUN 38 (H) 05/15/2020   GFR 30.36 (L) 03/08/2020   GFRNONAA 37 (L) 05/15/2020   GFRAA 44 (L) 02/28/2019   NA 136 05/15/2020   K 3.5 05/15/2020   CALCIUM 8.9 05/15/2020   CO2 43 (H) 05/15/2020   GLUCOSE 105 (H) 05/15/2020    Lab Results  Component Value Date/Time   HGBA1C 6.4 03/08/2020 01:47 PM   HGBA1C 6.0 (H) 09/08/2018 12:20 AM   GFR 30.36 (L) 03/08/2020 01:47 PM   GFR 34.26 (L) 10/28/2018 02:06 PM   MICROALBUR 1.0 04/10/2015 03:05 PM   MICROALBUR 1.7 03/25/2009 10:04 AM    Last diabetic Eye exam:  Lab Results  Component Value Date/Time   HMDIABEYEEXA No Retinopathy 10/07/2018 12:00 AM    Last diabetic Foot exam:  Lab Results  Component Value Date/Time   HMDIABFOOTEX done 10/21/2018 12:00 AM     Lab Results  Component Value Date   CHOL 176 03/08/2020   HDL 45.30 03/08/2020   LDLCALC 113 (H) 03/08/2020   LDLDIRECT 169.4 05/02/2007   TRIG 88.0 03/08/2020   CHOLHDL 4 03/08/2020    Hepatic Function Latest Ref Rng & Units 05/09/2020 03/08/2020 02/23/2019  Total Protein 6.5 - 8.1 g/dL 5.7(L) 7.3 6.1(L)  Albumin 3.5 - 5.0 g/dL 3.0(L) 4.2 3.2(L)  AST 15 - 41 U/L '17 26 21  ' ALT 0 - 44 U/L '16 17 15  ' Alk Phosphatase 38 - 126 U/L 73 71 76  Total Bilirubin 0.3 - 1.2 mg/dL 0.9 0.6 0.5  Bilirubin, Direct 0.0 - 0.2 mg/dL - - -    Lab Results  Component Value Date/Time   TSH 2.95 03/08/2020 01:47 PM   TSH 2.369 11/11/2018 06:52 PM   TSH 2.10 12/03/2017 03:01 PM   FREET4 0.8 03/16/2006 10:27 AM    CBC Latest Ref Rng & Units 05/15/2020 05/13/2020 05/12/2020  WBC 4.0 - 10.5 K/uL  4.6 4.3 4.1  Hemoglobin 12.0 - 15.0 g/dL 10.4(L) 10.7(L) 10.9(L)  Hematocrit 36.0 - 46.0 % 33.3(L) 34.6(L) 35.8(L)  Platelets 150 - 400 K/uL 123(L) 129(L) 129(L)    Lab Results  Component Value Date/Time   VD25OH 47.2 06/02/2016 04:07 PM   VD25OH 42 01/23/2011 01:52 PM    Clinical ASCVD: No  The 10-year ASCVD risk score Mikey Bussing DC Jr., et al., 2013) is: 15.6%   Values used to calculate the score:     Age: 58 years     Sex: Female     Is Non-Hispanic African American: Yes     Diabetic: Yes     Tobacco smoker: No     Systolic Blood Pressure: 471 mmHg     Is BP treated: Yes     HDL Cholesterol: 45.3 mg/dL     Total Cholesterol: 176 mg/dL    Depression screen [employer] 2/9 07/15/2018 12/14/2017  Decreased Interest 0 0  Down, Depressed, Hopeless 0 0  PHQ - 2 Score 0 0  Altered sleeping - 0  Tired, decreased energy - 0  Change in appetite - 0  Feeling bad or failure about yourself  - 0  Trouble concentrating - 0  Moving slowly or fidgety/restless - 0  Suicidal thoughts - 0  PHQ-9 Score - 0  Some recent data might be hidden      Social History   Tobacco Use  Smoking Status Never Smoker  Smokeless Tobacco Never Used   BP Readings from Last 3 Encounters:  05/15/20 (!) 105/51  03/08/20 140/76  10/20/19 (!) 142/80   Pulse Readings from Last 3 Encounters:  05/15/20 81  03/08/20 64  10/20/19 72   Wt Readings from Last 3 Encounters:  05/14/20 (!) 362 lb 14 oz (164.6 kg)  10/20/19 (!) 340 lb (154.2 kg)  09/04/19 (!) 340 lb (154.2 kg)   BMI Readings from Last 3 Encounters:  05/14/20 62.29 kg/m  03/08/20 58.36 kg/m  10/20/19 58.36 kg/m    Assessment/Interventions: Review of patient past medical history, allergies, medications, health status, including review of consultants reports, laboratory and other test data, was performed as part of comprehensive evaluation and provision of chronic care management services.   SDOH:  (Social Determinants of Health) assessments and  interventions performed: Yes  SDOH Screenings   Alcohol Screen: Not on file  Depression (GXI7-1): Not on file  Financial Resource Strain: Not on file  Food Insecurity: Not on file  Housing: Not on file  Physical Activity: Not on file  Social Connections: Not on file  Stress: Not on file  Tobacco Use: Low Risk   . Smoking Tobacco Use: Never Smoker  . Smokeless Tobacco Use: Never Used  Transportation Needs: Not on file    CCM Care Plan  Allergies  Allergen Reactions  . Penicillins Rash and Other (See Comments)  She was told not to take it anymore. Has patient had a PCN reaction causing immediate rash, facial/tongue/throat swelling, SOB or lightheadedness with hypotension: Yes Has patient had a PCN reaction causing severe rash involving mucus membranes or skin necrosis: No Has patient had a PCN reaction that required hospitalization: No Has patient had a PCN reaction occurring within the last 10 years: No If all of the above answers are "NO", then may proceed with Cephalosporin use.'  . Shellfish Allergy Anaphylaxis  . Sulfonamide Derivatives Anaphylaxis and Other (See Comments)  . Hydrochlorothiazide W-Triamterene Other (See Comments)    Hypokalemia  . Lotensin [Benazepril Hcl] Hives  . Dipyridamole Other (See Comments)    Unknown reaction  . Estrogens Other (See Comments)    Unknown reaction  . Hydrochlorothiazide Other (See Comments)    Unknown reaction  . Latex Other (See Comments)    Unknown reaction - stated previously during surgery gloves had to be changed, but unsure if it is still an allergy or not.   . Metronidazole Other (See Comments)    Unknown reaction  . Other Itching    PICKLES  . Spironolactone Other (See Comments)    UNSPECIFIED > "kidney problems"  . Sulfa Antibiotics Other (See Comments)    Unknown reaction  . Torsemide Other (See Comments)    Unknown reaction  . Valsartan Other (See Comments)    Unknown reaction  . Madelaine Bhat  Isothiocyanate] Itching    Medications Reviewed Today    Reviewed by Tollie Pizza, CPhT (Pharmacy Technician) on 05/08/20 at 1243  Med List Status: Complete  Medication Order Taking? Sig Documenting Provider Last Dose Status Informant  acetaminophen (TYLENOL) 325 MG tablet [id number] Yes Take 2 tablets (650 mg total) by mouth every 6 (six) hours as needed for moderate pain (or Fever >/= 101). Arrien, Jimmy Picket, MD 05/07/2020 Unknown time Active Self  allopurinol (ZYLOPRIM) 100 MG tablet [id number] Yes Take 1 tablet (100 mg total) by mouth daily. [name], MD 05/07/2020 Unknown time Active Self  aspirin (ECOTRIN LOW STRENGTH) 81 MG EC tablet [id number] Yes Take 81 mg by mouth at bedtime. [provider] Past Week Unknown time Active Self  Budesonide (PULMICORT FLEXHALER) 90 MCG/ACT inhaler [id number] Yes Inhale 1 puff into the lungs See admin instructions. 1 puff daily and 1 additional puff as needed wheezing or shortness of breath [provider] 05/07/2020 Unknown time Active Self  budesonide-formoterol (SYMBICORT) 160-4.5 MCG/ACT inhaler [id number] Yes one - two inhalations every 12 hours; gargle and spit after use  Patient taking differently: Inhale 2 puffs into the lungs 2 (two) times daily as needed (wheezing). gargle and spit after use   [name], MD 05/07/2020 Unknown time Active Self  calcium carbonate (TUMS - DOSED IN MG ELEMENTAL CALCIUM) 500 MG chewable tablet [id number] Yes Chew 2 tablets by mouth daily as needed for indigestion or heartburn. [provider] Past Week Unknown time Active Self  carvedilol (COREG) 12.5 MG tablet [id number] Yes TAKE 1 TABLET(12.5 MG) BY MOUTH TWICE DAILY WITH A MEAL  Patient taking differently: Take 12.5 mg by mouth 2 (two) times daily with a meal.   [name], MD 05/07/2020 1200 Active Self  cholecalciferol (VITAMIN D3) 25 MCG (1000 UNIT) tablet [id number] Yes Take 1 tablet (1,000 Units total) by mouth daily. [name], MD 05/07/2020 Unknown time Active Self  COD LIVER OIL PO [id number] Yes Take 1 capsule by mouth daily. [provider] 05/07/2020 Unknown time Active  Self  colchicine 0.6 MG tablet [id number] Yes Take 0.5 tablets (0.3 mg total) by mouth daily. [name], MD 05/07/2020 Unknown time Active Self  cyanocobalamin (,VITAMIN B-12,) 1000 MCG/ML injection [id number] Yes INJECT 1ML INTO MUSCLE EVERY 30 DAYS  Patient taking differently: Inject 1,000 mcg into the muscle every 30 (thirty) days.   [name], MD Past Month Unknown time Active Self  diclofenac sodium (VOLTAREN) 1 % GEL [id number] Yes Apply 2 g topically 4 (four) times daily. Marcial Pacas, MD Past Week Unknown time Active Self           Med Note [place] John Mar 01, 2018  3:27 PM)    famotidine (PEPCID) 20 MG tablet [id number] Yes Take 1 tablet (20 mg total) by mouth 2 (two) times daily.  Patient taking differently: Take 20 mg by mouth daily as needed for heartburn.   [name], MD Past Week Unknown time Active Self           Med Note Eulas Post, CRYSTAL D   Tue Dec 06, 2018 10:58 PM)    ferrous sulfate 325 (65 FE) MG tablet [id number] Yes Take 1 tablet (325 mg total) by mouth every Monday, Wednesday, and Friday. Chauncey Mann, MD Past Week Unknown time Active Self  folic acid (FOLVITE) 244 MCG tablet [id number] Yes Take 1 tablet (400 mcg total) by mouth at bedtime. Take 400 mcg by mouth at bedtime. [name], MD 05/07/2020 Unknown time Active Self  furosemide (LASIX) 80 MG tablet [id number] Yes TAKE 1 TABLET(80 MG) BY MOUTH TWICE DAILY  Patient taking differently: 80 mg 3 (three) times daily as needed for fluid or edema.   Chauncey Mann, MD 05/07/2020 Unknown time Active Self  gabapentin (NEURONTIN) 100 MG capsule [id number] Yes Take 1 capsule (100 mg total) by mouth 3 (three) times daily. [name], MD 05/07/2020 Unknown time Expired 02/04/19 2359 Self  hydrOXYzine (ATARAX/VISTARIL) 25 MG tablet [id number] Yes Take 1  tablet (25 mg total) by mouth 3 (three) times daily as needed for itching. Chauncey Mann, MD Past Week Unknown time Active Self  montelukast (SINGULAIR) 10 MG tablet [id number] Yes Take 1 tablet (10 mg total) by mouth at bedtime. [name], MD 05/07/2020 Unknown time Active Self  Oxcarbazepine (TRILEPTAL) 300 MG tablet [id number] Yes Take 1 tablet (300 mg total) by mouth 2 (two) times daily. [name], MD 05/07/2020 Unknown time Active Self  oxyCODONE (OXY IR/ROXICODONE) 5 MG immediate release tablet [id number] Yes Take 1 tablet (5 mg total) by mouth every 6 (six) hours as needed (chronic knee and back pain). [name], MD 05/07/2020 Unknown time Active Self  polyethylene glycol (MIRALAX / GLYCOLAX) 17 g packet [id number] Yes Take 17 g by mouth daily. Chauncey Mann, MD Past Month Unknown time Active Self  potassium chloride SA (KLOR-CON) 20 MEQ tablet [id number] Yes Take 1 tablet (20 mEq total) by mouth 3 (three) times daily. [name], MD 05/07/2020 Unknown time Active Self  senna-docusate (SENOKOT-S) 8.6-50 MG tablet [id number] Yes Take 1 tablet by mouth at bedtime as needed for mild constipation. Chauncey Mann, MD 05/07/2020 Unknown time Active Self  simvastatin (ZOCOR) 40 MG tablet [id number] Yes Take 1 tablet (40 mg total) by mouth at bedtime. [name], MD 05/07/2020 Unknown time Active Self  spironolactone (ALDACTONE) 25 MG tablet [id number] Yes Take 1 tablet (25 mg total) by mouth daily. [name], MD 05/07/2020 Unknown  time Active Self  VENTOLIN HFA 108 (90 Base) MCG/ACT inhaler [id number] Yes Inhale 2 puffs into the lungs every 4 (four) hours as needed for wheezing. [provider] unk Active Self          Patient Active Problem List   Diagnosis Date Noted  . Cellulitis, leg 05/08/2020  . Leg DVT (deep venous thromboembolism), acute, bilateral ([place]) 05/08/2020  . Acute respiratory failure with hypoxia and hypercapnia ([place]) 05/08/2020  . SOB (shortness of breath)    . Acute idiopathic gout of right foot   . Morbid obesity ([place])   . Idiopathic chronic gout of multiple sites without tophus   . Cellulitis 02/02/2019  . Pneumonia due to COVID-19 virus 12/10/2018  . COVID-19 virus infection 12/06/2018  . Right hip pain 11/09/2018  . Osteomyelitis of ankle or foot, acute, left ([place]) 09/07/2018  . Osteomyelitis of ankle or foot, left, acute ([place]) 09/07/2018  . Onychomycosis 05/19/2018  . Idiopathic chronic venous hypertension of right lower extremity with ulcer and inflammation ([place]) 05/19/2018  . Skin lumps 01/14/2018  . Bilateral leg cramps 12/14/2017  . Left shoulder pain 08/14/2016  . Meralgia paresthetica 07/16/2016  . Chronic left-sided low back pain with left-sided sciatica 06/02/2016  . Whole body pain 03/20/2016  . Osteopenia 01/11/2016  . CKD (chronic kidney disease) stage 3, GFR 30-59 ml/min (HCC) 01/02/2016  . Hand paresthesia 07/18/2015  . Carpal tunnel syndrome 06/07/2015  . Family history of colon cancer   . Diabetes mellitus with neurological manifestations ([place]) 04/18/2015  . Bilateral leg edema 04/11/2015  . Abnormality of gait 01/03/2015  . Hereditary and idiopathic peripheral neuropathy 01/03/2015  . GERD (gastroesophageal reflux disease) 06/17/2014  . Hx of colonic polyps 12/15/2012  . Vitamin B12 deficiency 11/03/2012  . Intrinsic asthma 03/23/2012  . Chronic diastolic heart failure ([place]) 02/20/2011  . OSA (obstructive sleep apnea) 09/17/2010  . URINARY URGENCY 01/08/2010  . CAD, NATIVE VESSEL 11/20/2008  . Osteoarthritis of knees, bilateral 06/14/2008  . Hyperlipidemia 05/10/2007  . Essential hypertension 01/18/2007  . Hypokalemia 04/30/2006  . HX, PERSONAL, PEPTIC ULCER DISEASE 04/30/2006    Immunization History  Administered Date(s) Administered  . Fluad Quad(high Dose 65+) 10/28/2018  . Influenza Split 12/03/2010, 11/03/2011  . Influenza Whole 11/16/2006, 11/22/2007, 10/31/2008, 10/29/2009  . Influenza, High  Dose Seasonal PF 10/10/2015, 11/06/2016, 11/16/2017, 11/08/2019  . Influenza,inj,Quad PF,6+ Mos 10/19/2012, 10/25/2013, 10/29/2014  . Influenza-Unspecified 10/23/2016, 11/18/2016  . Moderna Sars-Covid-2 Vaccination 03/13/2019, 04/10/2019, 12/06/2019  . Pneumococcal Conjugate-13 06/07/2015  . Pneumococcal Polysaccharide-23 03/24/2005, 01/04/2012, 06/29/2017  . Td 06/24/2009  . Tdap 10/09/2014  . Zoster 01/15/2012  . Zoster Recombinat (Shingrix) 08/11/2016    Conditions to be addressed/monitored:  Hypertension, Hyperlipidemia, Diabetes, Heart Failure, GERD, Asthma, Chronic Kidney Disease, Osteopenia, Osteoarthritis and Gout  Current Barriers:  . Unable to independently afford treatment regimen . Unable to independently monitor therapeutic efficacy  Pharmacist Clinical Goal(s):  Marland Kitchen Patient will verbalize ability to afford treatment regimen . achieve adherence to monitoring guidelines and medication adherence to achieve therapeutic efficacy through collaboration with PharmD and provider.   Interventions: . 1:1 collaboration with [name], MD regarding development and update of comprehensive plan of care as evidenced by provider attestation and co-signature . Inter-disciplinary care team collaboration (see longitudinal plan of care) . Comprehensive medication review performed; medication list updated in electronic medical record  DVT (05/08/20) (Goal: prevent recurrent VTE) -Controlled - pt is getting Eliquis in rehab, she has not yet had to pay for outpatient  rx. She is worried about cost. Current length of therapy is unknown, at least 3-6 months. -Current treatment  . Eliquis 5 mg BID -Educated on length of therapy for Eliquis - bilateral DVT indicates high clot burden, and pt is relatively sedentary. Unless patient can commit to being more active, indefinite anticoagulation is likely. -Assessed finances - pt will not qualify for Eliquis patient assistance until she pays 3% of annual  household income on prescription drugs in 2022. Pt is not sure if she will reach that any time soon. Provided patient with a 30-day free trial coupon for her first fill out of rehab, but beyond that [facility] covers Eliquis at Tier 3. -Recommended to continue current medication  Hyperlipidemia: (LDL goal < 100) -Controlled -Current treatment: . Simvastatin 40 mg daily -Educated on Cholesterol goals;  Benefits of statin for ASCVD risk reduction; -Recommended to continue current medication  Diabetes (A1c goal <7%) -Diet-controlled -Educated on A1c and blood sugar goals; Complications of diabetes including kidney damage, retinal damage, and cardiovascular disease; -Counseled on diet and exercise extensively  Heart Failure / HTN (Goal: BP < 130/80; prevent exacerbations) -Relatively controlled - pt is not sure if sure if the rehab facility is giving her torsemide correctly -Last ejection fraction: 60-65% (Date: 05/10/20) -HF type: Diastolic (Grade II dysfunction) -Current treatment: . Carvedilol 12.5 mg BID . Spironolactone 25 mg daily . Torsemide 60 mg BID . Klor-Con 20 mEq TID -Medications previously tried: furosemide -Educated on Benefits of medications for managing symptoms and prolonging life Proper diuretic administration and potassium supplementation -Recommend to continue current medication  Asthma / OSA (Goal: control symptoms and prevent exacerbations) -Controlled -Current treatment  . Symbicort 16-4.5 mcg/act 2 puffs BID . Pulmicort 90 mcg/act 1 puff dily - not using . Montelukast 10 mg daily HS . Albuterol HFA prn  -Pulmonary function testing: not on file -Exacerbations requiring treatment in last 6 months: none -Patient reports consistent use of maintenance inhaler -Frequency of rescue inhaler use: rare -Counseled on Proper inhaler technique; Benefits of consistent maintenance inhaler use  -Recommended to discontinue Pulmicort due to duplicate therapy with Symbicort  (budesonide). Continue other medications as prescribed.  Osteopenia (Goal: prevent fractures) -Controlled -Last DEXA Scan: 01/2016  T-Score femoral neck: -1.3  T-Score lumbar spine: -1.6  10-year probability of major osteoporotic fracture: 2.8%  10-year probability of hip fracture: 0.2% -Patient is not a candidate for pharmacologic treatment -Current treatment  . Vitamin D 1000 IU daily -Recommend [phone] units of vitamin D daily. Recommend 1200 mg of calcium daily from dietary and supplemental sources. Recommend weight-bearing and muscle strengthening exercises for building and maintaining bone density.  Gout (Goal: prevent flares, maintain uric acid < 6) -Controlled - pt does not remember when last gout flare was; per chart colchicine appears to be PRN based on quantity dispensed; pt is not sure if she is taking it everyday but that is doubtful based on dispense history -Current treatment  . Allopurinol 100 mg daily . Colchicine 0.6 mg - 1/2 tab daily PRN -Counseled that allopurinol is daily pill for prevention of gout flares; colchicine is as needed for gout flares only -Recommended to continue current medication  Chronic pain  (Goal: manage pain) -Relatively controlled - pt reports oxycodone rx was delayed at rehab center so she was without it for a few days; -Current treatment  . Voltaren 1% gel . Tylenol 325 mg PRN . Oxycodone 5 mg q6h PRN . Gabapentin 100 mg TID . Oxcarbazepine 300 mg BID -Counseled on  side effects of oxycodone including constipation and itching; counseled on benefits of gabapentin and oxcarbazepine for nerve pain -Recommended to continue current medication  GERD/ GI (Goal: manage symptoms) -Controlled -Current treatment  . Famotidine 20 mg BID . Tums PRN . Miralax PRN . Senna-docuate PRN -Patient is satisfied with current regimen and denies issues -Recommended to continue current medication  Health Maintenance -Vaccine gaps: none -Current  therapy:  . Cod liver oil . Prosource Plus feeding supplement . Hydroxyzine 25 mg TID prn itching . Ferrous sulfate 25 mg MWF . Folic acid 601 mcg daily . Vitamin B12 injection q30 days -Patient is satisfied with current therapy and denies issues -Recommended to continue current medication  Patient Goals/Self-Care Activities . Patient will:  - take medications as prescribed focus on medication adherence by pill box  Medication Assistance:  -Eliquis free trial card given for when she gets out of rehab. Instructed to bring card to pharmacy when filling Eliquis. -Discussed Eliquis patient assistance - advised pt she will have to spend 3% of income on drug costs in 2022 before she qualifies for free Eliquis.  Patient's preferred pharmacy is:  [facility] [phone] - [place], [place] - [address] AT [address] [place] [address] Phone: [phone] Fax: [phone]  Uses pill box? Yes Pt endorses 100% compliance  We discussed: Current pharmacy is preferred with insurance plan and patient is satisfied with pharmacy services Patient decided to: Continue current medication management strategy  Plan: Next PCP appointment scheduled for:  09/06/20  Charlene Brooke, PharmD, BCACP, CPP Clinical Pharmacist [facility] at [facility] [phone]

## 2020-05-24 NOTE — Assessment & Plan Note (Signed)
New problem She was receiving Lovenox injections while she was in the hospital and she does have a hematoma in the left lower abdomen that is tender No evidence of infection, no fluctuance She will monitor

## 2020-05-27 NOTE — Patient Instructions (Addendum)
Thank you for meeting with me to discuss your medications! Below is a summary of what we talked about during the visit:   Make sure the rehab facility is giving you torsemide 60 mg twice a day.  Use Voltaren gel up to 4 times daily for joint pain  Stop using Pulmicort inhaler (Symbicort contains the same medication)  Take Eliquis Free Trial card to [facility] when you fill Eliquis for the first time.  After you spend 3% of your annual income on prescription drugs, you may qualify for free Eliquis from the drug company.   Charlene Brooke, PharmD, [place], CPP Clinical Pharmacist [facility] at [facility] [phone]

## 2020-05-30 DIAGNOSIS — I5032 Chronic diastolic (congestive) heart failure: Secondary | ICD-10-CM | POA: Diagnosis not present

## 2020-05-30 DIAGNOSIS — I82409 Acute embolism and thrombosis of unspecified deep veins of unspecified lower extremity: Secondary | ICD-10-CM | POA: Diagnosis not present

## 2020-05-30 DIAGNOSIS — N2581 Secondary hyperparathyroidism of renal origin: Secondary | ICD-10-CM | POA: Diagnosis not present

## 2020-05-30 DIAGNOSIS — E1122 Type 2 diabetes mellitus with diabetic chronic kidney disease: Secondary | ICD-10-CM | POA: Diagnosis not present

## 2020-05-30 DIAGNOSIS — I890 Lymphedema, not elsewhere classified: Secondary | ICD-10-CM | POA: Diagnosis not present

## 2020-05-30 DIAGNOSIS — N1831 Chronic kidney disease, stage 3a: Secondary | ICD-10-CM | POA: Diagnosis not present

## 2020-05-30 DIAGNOSIS — D631 Anemia in chronic kidney disease: Secondary | ICD-10-CM | POA: Diagnosis not present

## 2020-05-31 DIAGNOSIS — I872 Venous insufficiency (chronic) (peripheral): Secondary | ICD-10-CM | POA: Diagnosis not present

## 2020-05-31 DIAGNOSIS — M130 Polyarthritis, unspecified: Secondary | ICD-10-CM | POA: Diagnosis not present

## 2020-05-31 DIAGNOSIS — R0602 Shortness of breath: Secondary | ICD-10-CM | POA: Diagnosis not present

## 2020-05-31 DIAGNOSIS — R5381 Other malaise: Secondary | ICD-10-CM | POA: Diagnosis not present

## 2020-05-31 DIAGNOSIS — I5033 Acute on chronic diastolic (congestive) heart failure: Secondary | ICD-10-CM | POA: Diagnosis not present

## 2020-05-31 DIAGNOSIS — M25561 Pain in right knee: Secondary | ICD-10-CM | POA: Diagnosis not present

## 2020-05-31 DIAGNOSIS — G894 Chronic pain syndrome: Secondary | ICD-10-CM | POA: Diagnosis not present

## 2020-05-31 DIAGNOSIS — R600 Localized edema: Secondary | ICD-10-CM | POA: Diagnosis not present

## 2020-05-31 DIAGNOSIS — E1140 Type 2 diabetes mellitus with diabetic neuropathy, unspecified: Secondary | ICD-10-CM | POA: Diagnosis not present

## 2020-06-03 ENCOUNTER — Institutional Professional Consult (permissible substitution): Payer: Medicare PPO | Admitting: Pulmonary Disease

## 2020-06-03 ENCOUNTER — Telehealth: Payer: Self-pay | Admitting: Sports Medicine

## 2020-06-03 ENCOUNTER — Telehealth: Payer: Self-pay | Admitting: Internal Medicine

## 2020-06-03 MED ORDER — APIXABAN 5 MG PO TABS: ORAL_TABLET | ORAL | 2 refills | Status: DC

## 2020-06-03 MED ORDER — TORSEMIDE 60 MG PO TABS: 60.0000 mg | ORAL_TABLET | Freq: Two times a day (BID) | ORAL | 1 refills | Status: DC

## 2020-06-03 MED ORDER — COLCHICINE 0.6 MG PO CAPS: 0.6000 mg | ORAL_CAPSULE | Freq: Every day | ORAL | 1 refills | Status: DC

## 2020-06-03 MED ORDER — OXCARBAZEPINE 300 MG PO TABS: 300.0000 mg | ORAL_TABLET | Freq: Two times a day (BID) | ORAL | 1 refills | Status: DC

## 2020-06-03 NOTE — Telephone Encounter (Signed)
I think we have faxed over the paperwork from [facility] for her diabetic shoes.  They send [place] the paperwork and we sign it.

## 2020-06-03 NOTE — Telephone Encounter (Signed)
   Patient calling to request order for diabetic shoes be faxed to [facility]

## 2020-06-03 NOTE — Telephone Encounter (Signed)
Pt left 2 messages asking if we had gotten the diabetic shoe paperwork back for her shoes.  I returned call and explained to pt we have not gotten it yet. I did fax on 4.4 to the number pt gave me with the atten clare on it as pt requested.

## 2020-06-03 NOTE — Telephone Encounter (Signed)
    Patient requesting order for  apixaban April [name]) 5 MG TABS tablet   Pharmacy [facility] [place], [place] AT [address] [place]

## 2020-06-03 NOTE — Telephone Encounter (Signed)
colchicine 0.6 MG tablet Oxcarbazepine (TRILEPTAL) 300 MG tablet Furosemide 80 mg Patient requesting refills.

## 2020-06-04 ENCOUNTER — Telehealth: Payer: Self-pay | Admitting: Internal Medicine

## 2020-06-04 ENCOUNTER — Other Ambulatory Visit: Payer: Self-pay | Admitting: Obstetrics & Gynecology

## 2020-06-04 ENCOUNTER — Other Ambulatory Visit: Payer: Self-pay

## 2020-06-04 ENCOUNTER — Other Ambulatory Visit: Payer: Self-pay | Admitting: Internal Medicine

## 2020-06-04 DIAGNOSIS — Z1231 Encounter for screening mammogram for malignant neoplasm of breast: Secondary | ICD-10-CM

## 2020-06-04 MED ORDER — TORSEMIDE 60 MG PO TABS: 60.0000 mg | ORAL_TABLET | Freq: Two times a day (BID) | ORAL | 1 refills | Status: DC

## 2020-06-04 NOTE — Telephone Encounter (Signed)
Refaxed today. Fax conformation received.

## 2020-06-04 NOTE — Telephone Encounter (Signed)
  Colchicine (MITIGARE) 0.6 MG CAPS Patient states they need a prior auth   Torsemide 60 MG TABS Pharmacy states they did not receive

## 2020-06-04 NOTE — Telephone Encounter (Signed)
Spoke with patient today. She knows to pick up torsemide and d/c the Lasix.

## 2020-06-04 NOTE — Telephone Encounter (Signed)
Only one - whatever one she is taking she should continue

## 2020-06-04 NOTE — Telephone Encounter (Signed)
Patient called and was wondering if she needed to be taking Torsemide 60 MG TABS and furosemide (LASIX) 80 MG tablet together or one or the other. She can be reached at [phone]. Please advise

## 2020-06-05 ENCOUNTER — Telehealth: Payer: Self-pay | Admitting: Internal Medicine

## 2020-06-05 DIAGNOSIS — E785 Hyperlipidemia, unspecified: Secondary | ICD-10-CM | POA: Diagnosis not present

## 2020-06-05 DIAGNOSIS — I130 Hypertensive heart and chronic kidney disease with heart failure and stage 1 through stage 4 chronic kidney disease, or unspecified chronic kidney disease: Secondary | ICD-10-CM | POA: Diagnosis not present

## 2020-06-05 DIAGNOSIS — D631 Anemia in chronic kidney disease: Secondary | ICD-10-CM | POA: Diagnosis not present

## 2020-06-05 DIAGNOSIS — I2510 Atherosclerotic heart disease of native coronary artery without angina pectoris: Secondary | ICD-10-CM | POA: Diagnosis not present

## 2020-06-05 DIAGNOSIS — I5033 Acute on chronic diastolic (congestive) heart failure: Secondary | ICD-10-CM | POA: Diagnosis not present

## 2020-06-05 DIAGNOSIS — E1122 Type 2 diabetes mellitus with diabetic chronic kidney disease: Secondary | ICD-10-CM | POA: Diagnosis not present

## 2020-06-05 DIAGNOSIS — N1832 Chronic kidney disease, stage 3b: Secondary | ICD-10-CM | POA: Diagnosis not present

## 2020-06-05 DIAGNOSIS — I82443 Acute embolism and thrombosis of tibial vein, bilateral: Secondary | ICD-10-CM | POA: Diagnosis not present

## 2020-06-05 DIAGNOSIS — I890 Lymphedema, not elsewhere classified: Secondary | ICD-10-CM | POA: Diagnosis not present

## 2020-06-05 NOTE — Telephone Encounter (Signed)
ok 

## 2020-06-05 NOTE — Telephone Encounter (Signed)
[name] w/ [facility] is requesting nursing verbals for 1w6 for CHF and DVT. Please advise    Okay to LVM: [phone]

## 2020-06-06 ENCOUNTER — Telehealth: Payer: Self-pay | Admitting: Internal Medicine

## 2020-06-06 DIAGNOSIS — I5033 Acute on chronic diastolic (congestive) heart failure: Secondary | ICD-10-CM | POA: Diagnosis not present

## 2020-06-06 DIAGNOSIS — D631 Anemia in chronic kidney disease: Secondary | ICD-10-CM | POA: Diagnosis not present

## 2020-06-06 DIAGNOSIS — I82443 Acute embolism and thrombosis of tibial vein, bilateral: Secondary | ICD-10-CM | POA: Diagnosis not present

## 2020-06-06 DIAGNOSIS — I890 Lymphedema, not elsewhere classified: Secondary | ICD-10-CM | POA: Diagnosis not present

## 2020-06-06 DIAGNOSIS — I2510 Atherosclerotic heart disease of native coronary artery without angina pectoris: Secondary | ICD-10-CM | POA: Diagnosis not present

## 2020-06-06 DIAGNOSIS — I130 Hypertensive heart and chronic kidney disease with heart failure and stage 1 through stage 4 chronic kidney disease, or unspecified chronic kidney disease: Secondary | ICD-10-CM | POA: Diagnosis not present

## 2020-06-06 DIAGNOSIS — E1122 Type 2 diabetes mellitus with diabetic chronic kidney disease: Secondary | ICD-10-CM | POA: Diagnosis not present

## 2020-06-06 DIAGNOSIS — E785 Hyperlipidemia, unspecified: Secondary | ICD-10-CM | POA: Diagnosis not present

## 2020-06-06 DIAGNOSIS — N1832 Chronic kidney disease, stage 3b: Secondary | ICD-10-CM | POA: Diagnosis not present

## 2020-06-06 NOTE — Telephone Encounter (Signed)
Verbals left for [place] today.

## 2020-06-06 NOTE — Telephone Encounter (Signed)
Anda Kraft a physical therapist with [facility] calling, requesting [place] PT orders for 1 w [place] to lvm

## 2020-06-06 NOTE — Telephone Encounter (Signed)
Pharmacy calling, states they need clinical information for the prior auth  [phone]

## 2020-06-06 NOTE — Telephone Encounter (Signed)
ok 

## 2020-06-07 ENCOUNTER — Other Ambulatory Visit: Payer: Self-pay

## 2020-06-07 ENCOUNTER — Telehealth: Payer: Self-pay | Admitting: Internal Medicine

## 2020-06-07 DIAGNOSIS — N1832 Chronic kidney disease, stage 3b: Secondary | ICD-10-CM | POA: Diagnosis not present

## 2020-06-07 DIAGNOSIS — K449 Diaphragmatic hernia without obstruction or gangrene: Secondary | ICD-10-CM

## 2020-06-07 DIAGNOSIS — I272 Pulmonary hypertension, unspecified: Secondary | ICD-10-CM

## 2020-06-07 DIAGNOSIS — J9601 Acute respiratory failure with hypoxia: Secondary | ICD-10-CM

## 2020-06-07 DIAGNOSIS — I700 Atherosclerosis of aorta: Secondary | ICD-10-CM

## 2020-06-07 DIAGNOSIS — E1122 Type 2 diabetes mellitus with diabetic chronic kidney disease: Secondary | ICD-10-CM | POA: Diagnosis not present

## 2020-06-07 DIAGNOSIS — J9602 Acute respiratory failure with hypercapnia: Secondary | ICD-10-CM

## 2020-06-07 DIAGNOSIS — Z6841 Body Mass Index (BMI) 40.0 and over, adult: Secondary | ICD-10-CM

## 2020-06-07 DIAGNOSIS — G4733 Obstructive sleep apnea (adult) (pediatric): Secondary | ICD-10-CM

## 2020-06-07 DIAGNOSIS — I82443 Acute embolism and thrombosis of tibial vein, bilateral: Secondary | ICD-10-CM | POA: Diagnosis not present

## 2020-06-07 DIAGNOSIS — E785 Hyperlipidemia, unspecified: Secondary | ICD-10-CM | POA: Diagnosis not present

## 2020-06-07 DIAGNOSIS — I890 Lymphedema, not elsewhere classified: Secondary | ICD-10-CM | POA: Diagnosis not present

## 2020-06-07 DIAGNOSIS — E662 Morbid (severe) obesity with alveolar hypoventilation: Secondary | ICD-10-CM

## 2020-06-07 DIAGNOSIS — I2510 Atherosclerotic heart disease of native coronary artery without angina pectoris: Secondary | ICD-10-CM | POA: Diagnosis not present

## 2020-06-07 DIAGNOSIS — D631 Anemia in chronic kidney disease: Secondary | ICD-10-CM | POA: Diagnosis not present

## 2020-06-07 DIAGNOSIS — Z7901 Long term (current) use of anticoagulants: Secondary | ICD-10-CM

## 2020-06-07 DIAGNOSIS — K219 Gastro-esophageal reflux disease without esophagitis: Secondary | ICD-10-CM

## 2020-06-07 DIAGNOSIS — M109 Gout, unspecified: Secondary | ICD-10-CM

## 2020-06-07 DIAGNOSIS — I130 Hypertensive heart and chronic kidney disease with heart failure and stage 1 through stage 4 chronic kidney disease, or unspecified chronic kidney disease: Secondary | ICD-10-CM | POA: Diagnosis not present

## 2020-06-07 DIAGNOSIS — M170 Bilateral primary osteoarthritis of knee: Secondary | ICD-10-CM

## 2020-06-07 DIAGNOSIS — I5033 Acute on chronic diastolic (congestive) heart failure: Secondary | ICD-10-CM | POA: Diagnosis not present

## 2020-06-07 DIAGNOSIS — J45909 Unspecified asthma, uncomplicated: Secondary | ICD-10-CM

## 2020-06-07 MED ORDER — TORSEMIDE 60 MG PO TABS: 60.0000 mg | ORAL_TABLET | Freq: Two times a day (BID) | ORAL | 1 refills | Status: DC

## 2020-06-07 NOTE — Telephone Encounter (Signed)
Verbals given today. °

## 2020-06-07 NOTE — Telephone Encounter (Signed)
She may need to be restarted for oxygen.  I think she has an appointment with pulmonary coming up-they would probably be the best to certify her

## 2020-06-07 NOTE — Telephone Encounter (Signed)
Okay 

## 2020-06-07 NOTE — Telephone Encounter (Signed)
April [name] w/ [facility] is requesting verbals for OT and home health aide for 1w8 for ADL. Please advise    Okay to LVM: [phone]

## 2020-06-11 ENCOUNTER — Other Ambulatory Visit: Payer: Self-pay

## 2020-06-11 ENCOUNTER — Encounter (HOSPITAL_COMMUNITY): Payer: Self-pay

## 2020-06-11 ENCOUNTER — Ambulatory Visit (HOSPITAL_COMMUNITY)
Admit: 2020-06-11 | Discharge: 2020-06-11 | Disposition: A | Discharge status: Home / Self Care | Payer: Medicare PPO | Attending: Cardiology | Admitting: Cardiology

## 2020-06-11 VITALS — BP 138/82 | HR 78 | Wt 370.6 lb

## 2020-06-11 DIAGNOSIS — J45909 Unspecified asthma, uncomplicated: Secondary | ICD-10-CM | POA: Diagnosis not present

## 2020-06-11 DIAGNOSIS — Z79899 Other long term (current) drug therapy: Secondary | ICD-10-CM | POA: Diagnosis not present

## 2020-06-11 DIAGNOSIS — Z880 Allergy status to penicillin: Secondary | ICD-10-CM | POA: Diagnosis not present

## 2020-06-11 DIAGNOSIS — N183 Chronic kidney disease, stage 3 unspecified: Secondary | ICD-10-CM | POA: Diagnosis not present

## 2020-06-11 DIAGNOSIS — E1122 Type 2 diabetes mellitus with diabetic chronic kidney disease: Secondary | ICD-10-CM | POA: Diagnosis not present

## 2020-06-11 DIAGNOSIS — I2510 Atherosclerotic heart disease of native coronary artery without angina pectoris: Secondary | ICD-10-CM | POA: Insufficient documentation

## 2020-06-11 DIAGNOSIS — Z6841 Body Mass Index (BMI) 40.0 and over, adult: Secondary | ICD-10-CM | POA: Diagnosis not present

## 2020-06-11 DIAGNOSIS — Z7951 Long term (current) use of inhaled steroids: Secondary | ICD-10-CM | POA: Diagnosis not present

## 2020-06-11 DIAGNOSIS — E785 Hyperlipidemia, unspecified: Secondary | ICD-10-CM | POA: Insufficient documentation

## 2020-06-11 DIAGNOSIS — I130 Hypertensive heart and chronic kidney disease with heart failure and stage 1 through stage 4 chronic kidney disease, or unspecified chronic kidney disease: Secondary | ICD-10-CM | POA: Diagnosis not present

## 2020-06-11 DIAGNOSIS — M1610 Unilateral primary osteoarthritis, unspecified hip: Secondary | ICD-10-CM | POA: Insufficient documentation

## 2020-06-11 DIAGNOSIS — Z882 Allergy status to sulfonamides status: Secondary | ICD-10-CM | POA: Diagnosis not present

## 2020-06-11 DIAGNOSIS — I272 Pulmonary hypertension, unspecified: Secondary | ICD-10-CM | POA: Diagnosis not present

## 2020-06-11 DIAGNOSIS — I5032 Chronic diastolic (congestive) heart failure: Secondary | ICD-10-CM | POA: Insufficient documentation

## 2020-06-11 DIAGNOSIS — Z881 Allergy status to other antibiotic agents status: Secondary | ICD-10-CM | POA: Diagnosis not present

## 2020-06-11 DIAGNOSIS — J9611 Chronic respiratory failure with hypoxia: Secondary | ICD-10-CM | POA: Insufficient documentation

## 2020-06-11 DIAGNOSIS — Z888 Allergy status to other drugs, medicaments and biological substances status: Secondary | ICD-10-CM | POA: Insufficient documentation

## 2020-06-11 DIAGNOSIS — I82403 Acute embolism and thrombosis of unspecified deep veins of lower extremity, bilateral: Secondary | ICD-10-CM | POA: Insufficient documentation

## 2020-06-11 DIAGNOSIS — Z7901 Long term (current) use of anticoagulants: Secondary | ICD-10-CM | POA: Insufficient documentation

## 2020-06-11 DIAGNOSIS — M1710 Unilateral primary osteoarthritis, unspecified knee: Secondary | ICD-10-CM | POA: Insufficient documentation

## 2020-06-11 DIAGNOSIS — Z791 Long term (current) use of non-steroidal anti-inflammatories (NSAID): Secondary | ICD-10-CM | POA: Insufficient documentation

## 2020-06-11 LAB — BASIC METABOLIC PANEL
Anion gap: 7 (ref 5–15)
BUN: 21 mg/dL (ref 8–23)
CO2: 32 mmol/L (ref 22–32)
Calcium: 8.9 mg/dL (ref 8.9–10.3)
Chloride: 98 mmol/L (ref 98–111)
Creatinine, Ser: 1.46 mg/dL — ABNORMAL HIGH (ref 0.44–1.00)
GFR, Estimated: 38 mL/min — ABNORMAL LOW (ref >60)
Glucose, Bld: 145 mg/dL — ABNORMAL HIGH (ref 70–99)
Potassium: 3.8 mmol/L (ref 3.5–5.1)
Sodium: 137 mmol/L (ref 135–145)

## 2020-06-11 LAB — BRAIN NATRIURETIC PEPTIDE: B Natriuretic Peptide: 68.3 pg/mL (ref 0.0–100.0)

## 2020-06-11 NOTE — Progress Notes (Signed)
Advanced Heart Failure Clinic Note   Referring Physician: PCP: [name], MD PCP-Cardiologist: None   Reason for visit: post hospital f/u for chronic diastolic heart failure   HPI: April [name] is a 70 year old woman with history of morbid obesity Body mass index is 65.23 kg/m., DM2, hypertension, hyperlipidemia, mild-to- moderate coronary artery disease and asthma. She underwent catheterization in February 2008, which showed 50-60% LAD lesion and 30% RCA lesion. EF was 70%. She had mild pulmonary hypertension, primarily due to high output state. Her mean pulmonary artery pressure was 27, wedge pressure was 10. PVR was 1.80 Woods units.Her last echo was in 2017 and showed normal LVEF 65-70%, G1DD, RV normal.   She was recently admitted for a/c dCHF. She presented to the [facility] on [facility]5 w/ complaints of increasing dyspnea w/ intermittent wheezing and cough + leg pain. Endorsed 30 lb wt gain since January.    In the ED, she was found to be hypoxic in the 80s, placed on 2L [place] w/ improvement. COVID negative. WBC normal. Hgb 12.6. CXR showed mid bibasilar atelectasis. Normal heart size and no edema or effusion. BNP normal at 60. Hs trop 12>>22>>15.  EKG NSR no ST abnormalities. D-dimer elevated at 1.33. LE venous dopplers + for bilateral DVT but chest CT showed no PE. SCr 1.48 c/w baseline.   She was admitted by [facility] for acute hypoxic respiratory failure, with suspected component of acute asthma exacerbation w/ ? CHF contributing, as well as for management of acute bilateral LE DVT w/ ? Superimposed cellulitis. Treated w/ IV abx and IV Lasix. Echo showed normal LVEF 60-65%. RV normal. She was diuresed and placed back on PO diuretics, switched from lasix 80 bid to torsemide 60 mg d/c. D/c wt 371 lb.   She returns to clinic today. In wheel chair but able to stand for wt. Her Wt is stable at 370 lb. Reports good UOP w/ torsemide. She is still very sedentary at baseline w/ limited mobility due to  marked obesity and knee/hip OA but working w/ home PT/OT. Still SOB w/ short transfers c/w baseline. No resting dyspnea. Denies CP.  Reports full compliance w/ Eliquis. She has new pt consult w/ pulmonology, Dr. Halford [name], next month for sleep evaluation.   BP and HR well controlled. Tolerating meds ok w/o side effects.   Echo 4/22: EF 60-65%. RV ok   Review of systems complete and found to be negative unless listed in HPI.      Past Medical History:  Diagnosis Date  . Anemia   . Asthma   . CAD (coronary artery disease)   . Carpal tunnel syndrome   . Cellulitis of both lower extremities 04/11/2015  . CHF (congestive heart failure) ([place])   . Chronic kidney disease    [facility]- followed by [facility]  . Colon polyp, hyperplastic 2007 & 2012  . Complication of anesthesia 1999   svt with renal calculi surgery, no problems since  . CTS (carpal tunnel syndrome)    bilateral  . Diabetes mellitus   . Eczema   . GERD (gastroesophageal reflux disease)   . History of kidney stones 1999  . Hyperlipidemia   . Hypertension   . Leg ulcer ([place]) 04/24/2015   Right lateral leg No evidence of an infection Monitor closely Keep edema controlled   . Leg ulcer ([place])    right lower  . Meralgia paresthetica    Dr. [name]  . Morbid obesity ([place])   . Morbid obesity ([place])   .  Neuropathy    toes and legs  . Osteoarthrosis, unspecified whether generalized or localized, lower leg    knee  . PUD (peptic ulcer disease)   . Shortness of breath dyspnea    with exertion  . Sleep apnea    per progress note 02/25/2018  . Type II or unspecified type diabetes mellitus without mention of complication, not stated as uncontrolled   . Unspecified hereditary and idiopathic peripheral neuropathy   . Urticaria   . Vitamin B12 deficiency   . Wears glasses   . Wound cellulitis    right upper leg, healing well    Current Outpatient Medications  Medication Sig Dispense Refill  . acetaminophen (TYLENOL) 325 MG tablet  Take 2 tablets (650 mg total) by mouth every 6 (six) hours as needed for moderate pain (or Fever >/= 101). 20 tablet 0  . allopurinol (ZYLOPRIM) 100 MG tablet Take 1 tablet (100 mg total) by mouth daily. 90 tablet 1  . apixaban (ELIQUIS) 5 MG TABS tablet Continue with 10 mg twice daily until 05/19/2020 afterwards take 5 mg twice daily 60 tablet 2  . budesonide-formoterol (SYMBICORT) 160-4.5 MCG/ACT inhaler one - two inhalations every 12 hours; gargle and spit after use (Patient taking differently: Inhale 2 puffs into the lungs 2 (two) times daily as needed (wheezing). gargle and spit after use) 1 Inhaler 4  . calcium carbonate (TUMS - DOSED IN MG ELEMENTAL CALCIUM) 500 MG chewable tablet Chew 2 tablets by mouth daily as needed for indigestion or heartburn.    . carvedilol (COREG) 12.5 MG tablet TAKE 1 TABLET(12.5 MG) BY MOUTH TWICE DAILY WITH A MEAL (Patient taking differently: Take 12.5 mg by mouth 2 (two) times daily with a meal.) 180 tablet 0  . cholecalciferol (VITAMIN D3) 25 MCG (1000 UNIT) tablet Take 1 tablet (1,000 Units total) by mouth daily. 90 tablet 1  . COD LIVER OIL PO Take 1 capsule by mouth daily.    . Colchicine (MITIGARE) 0.6 MG CAPS Take 0.6 mg by mouth daily. 90 capsule 1  . cyanocobalamin (,VITAMIN B-12,) 1000 MCG/ML injection INJECT 1ML INTO MUSCLE EVERY 30 DAYS (Patient taking differently: Inject 1,000 mcg into the muscle every 30 (thirty) days.) 10 mL 0  . diclofenac sodium (VOLTAREN) 1 % GEL Apply 2 g topically 4 (four) times daily. 100 g 11  . famotidine (PEPCID) 20 MG tablet Take 1 tablet (20 mg total) by mouth 2 (two) times daily. (Patient taking differently: Take 20 mg by mouth daily as needed for heartburn.) 180 tablet 3  . ferrous sulfate 325 (65 FE) MG tablet Take 1 tablet (325 mg total) by mouth every Monday, Wednesday, and Friday. 60 tablet 0  . gabapentin (NEURONTIN) 100 MG capsule Take 1 capsule (100 mg total) by mouth 3 (three) times daily. 90 capsule 0  .  hydrOXYzine (ATARAX/VISTARIL) 25 MG tablet Take 1 tablet (25 mg total) by mouth 3 (three) times daily as needed for itching. 30 tablet 0  . montelukast (SINGULAIR) 10 MG tablet Take 1 tablet (10 mg total) by mouth at bedtime. 30 tablet 5  . Nutritional Supplements (,FEEDING SUPPLEMENT, PROSOURCE PLUS) liquid Take 30 mLs by mouth 2 (two) times daily between meals.    . Oxcarbazepine (TRILEPTAL) 300 MG tablet Take 1 tablet (300 mg total) by mouth 2 (two) times daily. 180 tablet 1  . oxyCODONE (OXY IR/ROXICODONE) 5 MG immediate release tablet Take 1 tablet (5 mg total) by mouth every 6 (six) hours as needed for up  to 5 doses for severe pain (chronic knee and back pain). 5 tablet 0  . polyethylene glycol (MIRALAX / GLYCOLAX) 17 g packet Take 17 g by mouth daily. 14 each 0  . senna-docusate (SENOKOT-S) 8.6-50 MG tablet Take 1 tablet by mouth at bedtime as needed for mild constipation. 30 tablet 0  . simvastatin (ZOCOR) 40 MG tablet Take 1 tablet (40 mg total) by mouth at bedtime. 90 tablet 1  . spironolactone (ALDACTONE) 25 MG tablet Take 1 tablet (25 mg total) by mouth daily. 90 tablet 0  . Torsemide 60 MG TABS Take 60 mg by mouth 2 (two) times daily. 180 tablet 1  . VENTOLIN HFA 108 (90 Base) MCG/ACT inhaler Inhale 2 puffs into the lungs every 4 (four) hours as needed for wheezing.    . folic acid (FOLVITE) 941 MCG tablet Take 1 tablet (400 mcg total) by mouth at bedtime. Take 400 mcg by mouth at bedtime. 90 tablet 1   No current facility-administered medications for this encounter.    Allergies  Allergen Reactions  . Penicillins Rash and Other (See Comments)    She was told not to take it anymore. Has patient had a PCN reaction causing immediate rash, facial/tongue/throat swelling, SOB or lightheadedness with hypotension: Yes Has patient had a PCN reaction causing severe rash involving mucus membranes or skin necrosis: No Has patient had a PCN reaction that required hospitalization: No Has  patient had a PCN reaction occurring within the last 10 years: No If all of the above answers are "NO", then may proceed with Cephalosporin use.'  . Shellfish Allergy Anaphylaxis  . Sulfonamide Derivatives Anaphylaxis and Other (See Comments)  . Hydrochlorothiazide W-Triamterene Other (See Comments)    Hypokalemia  . Lotensin [Benazepril Hcl] Hives  . Dipyridamole Other (See Comments)    Unknown reaction  . Estrogens Other (See Comments)    Unknown reaction  . Hydrochlorothiazide Other (See Comments)    Unknown reaction  . Latex Other (See Comments)    Unknown reaction - stated previously during surgery gloves had to be changed, but unsure if it is still an allergy or not.   . Metronidazole Other (See Comments)    Unknown reaction  . Other Itching    PICKLES  . Spironolactone Other (See Comments)    UNSPECIFIED > "kidney problems"  . Sulfa Antibiotics Other (See Comments)    Unknown reaction  . Torsemide Other (See Comments)    Unknown reaction  . Valsartan Other (See Comments)    Unknown reaction  . Madelaine Bhat Isothiocyanate] Itching      Social History   Socioeconomic History  . Marital status: Married    Spouse name: April [name]  . Number of children: 1  . Years of education: BS  . Highest education level: Not on file  Occupational History  . Occupation: Disabled    Employer: RETIRED  Tobacco Use  . Smoking status: Never Smoker  . Smokeless tobacco: Never Used  Vaping Use  . Vaping Use: Never used  Substance and Sexual Activity  . Alcohol use: No    Alcohol/week: 0.0 standard drinks  . Drug use: No  . Sexual activity: Not on file  Other Topics Concern  . Not on file  Social History Narrative   Patient lives at home with her husband April [name]) . Patient is retired and has a Conservation officer, nature.    Caffeine - some times.   Right handed.   Social Determinants of Radio broadcast assistant  Strain: Not on file  Food Insecurity: Not on file  Transportation  Needs: Not on file  Physical Activity: Not on file  Stress: Not on file  Social Connections: Not on file  Intimate Partner Violence: Not on file      Family History  Problem Relation Age of Onset  . Colon cancer Mother   . Prostate cancer Father   . Colon cancer Father   . Diabetes Maternal Aunt   . Breast cancer Maternal Aunt   . Diabetes Maternal Uncle   . Diabetes Paternal Aunt   . Stroke Paternal Aunt        > 65  . Heart disease Paternal Aunt   . Diabetes Paternal Uncle   . Breast cancer Maternal Aunt         X 2  . Breast cancer Cousin     Vitals:   06/11/20 0923  BP: 138/82  Pulse: 78  SpO2: (!) 89%  Weight: (!) 168.1 kg (370 lb 9.6 oz)     PHYSICAL EXAM: General:  Well appearing super morbidly obese F in WC. No respiratory difficulty HEENT: normal Neck: supple. Thick neck, JVD not well visualized. Carotids 2+ bilat; no bruits. No lymphadenopathy or thyromegaly appreciated. Cor: PMI nondisplaced. Regular rate & rhythm. No rubs, gallops or murmurs. Lungs: clear Abdomen: soft, nontender, nondistended. No hepatosplenomegaly. No bruits or masses. Good bowel sounds. Extremities: no cyanosis, clubbing, rash, obese lower extremites, no pitting edema, chronic venous stasis dermatitis bilaterally  Neuro: alert & oriented x 3, cranial nerves grossly intact. moves all 4 extremities w/o difficulty. Affect pleasant.  ECG: Not performed    ASSESSMENT & PLAN:  1. Chronic Hypoxic Respiratory Failure - multifactorial 2/2. Suspected OSA/ OHS + chronic diastolic heart failure - needs sleep study. She has new pt consult w/ Dr. Halford [name] for sleep evaluation  - needs wt loss but mobility limited by severe knee/hip OA   2. Chronic Diastolic Heart Failure  - RHC 2008 c/w mild pulmonary hypertension, primarily due to high output state. Her mean pulmonary artery pressure was 27, wedge pressure was 10. PVR was 1.80 Woods units. - Echo 2017 LVEF 65-70%, G1DD, RV normal - Echo 4/8  LVEF 60-65%, G2DD  - Recent admit for a/c dCHF. Volume improved w/ IV Lasix. Wt stable since d/c wt, down 1 lb - Continue torsemide 60 mg bid  - continue spiro 25 mg daily  - no SGLT2i given risk of GU infection given morbid obesity/ large panus  - check BMP and BNP today  - discussed importance of sodium/fluid restriction   3. Bilateral LE DVT (Diagnosed 4/22) - confirmed by venous dopplers. Chest CT negative for PE - Now on eliquis  4. Asthma  - stable. No wheezing on exam  - followed by PCP. New pt consult w/ pulmonology planned 6/3   5. CAD:  - LHC in 2008 showed 50-60% LAD lesion and 30% RCA lesion - no s/s of angina  - c/w ? blocker and statin - no ASA given eliquis for DVT tx   6. Stage III CKD - Baseline SCr ~1.5 - followed by Dr. [name] at [facility] today    F/u w/ APP in 6-8 weeks   Lyda Jester, PA-C 06/11/20

## 2020-06-11 NOTE — Patient Instructions (Signed)
It was great to see you today! No medication changes are needed at this time.  Labs today We will only contact you if something comes back abnormal or we need to make some changes. Otherwise no news is good news!  Your physician recommends that you schedule a follow-up appointment in: 6-8 weeks  in the Advanced Practitioners (PA/NP) Clinic    Do the following things EVERYDAY: 1) Weigh yourself in the morning before breakfast. Write it down and keep it in a log. 2) Take your medicines as prescribed 3) Eat low salt foods--Limit salt (sodium) to 2000 mg per day.  4) Stay as active as you can everyday 5) Limit all fluids for the day to less than 2 liters  At the [facility], you and your health needs are our priority. As part of our continuing mission to provide you with exceptional heart care, we have created designated Provider Care Teams. These Care Teams include your primary Cardiologist (physician) and Advanced Practice Providers (APPs- Physician Assistants and Nurse Practitioners) who all work together to provide you with the care you need, when you need it.   You may see any of the following providers on your designated Care Team at your next follow up: Marland Kitchen Dr Glori Bickers . Dr Loralie Champagne . Dr Vickki Muff . Darrick Grinder, NP . Lyda Jester, [place] . Audry Riles, PharmD   Please be sure to bring in all your medications bottles to every appointment.   If you have any questions or concerns before your next appointment please send [place] a message through [place] or call our office at [phone].    TO LEAVE A MESSAGE FOR THE NURSE SELECT OPTION 2, PLEASE LEAVE A MESSAGE INCLUDING: . YOUR NAME . DATE OF BIRTH . CALL BACK NUMBER . REASON FOR CALL**this is important as we prioritize the call backs  YOU WILL RECEIVE A CALL BACK THE SAME DAY AS LONG AS YOU CALL BEFORE 4:00 PM

## 2020-06-12 DIAGNOSIS — I130 Hypertensive heart and chronic kidney disease with heart failure and stage 1 through stage 4 chronic kidney disease, or unspecified chronic kidney disease: Secondary | ICD-10-CM | POA: Diagnosis not present

## 2020-06-12 DIAGNOSIS — I2510 Atherosclerotic heart disease of native coronary artery without angina pectoris: Secondary | ICD-10-CM | POA: Diagnosis not present

## 2020-06-12 DIAGNOSIS — E1122 Type 2 diabetes mellitus with diabetic chronic kidney disease: Secondary | ICD-10-CM | POA: Diagnosis not present

## 2020-06-12 DIAGNOSIS — E785 Hyperlipidemia, unspecified: Secondary | ICD-10-CM | POA: Diagnosis not present

## 2020-06-12 DIAGNOSIS — D631 Anemia in chronic kidney disease: Secondary | ICD-10-CM | POA: Diagnosis not present

## 2020-06-12 DIAGNOSIS — I82443 Acute embolism and thrombosis of tibial vein, bilateral: Secondary | ICD-10-CM | POA: Diagnosis not present

## 2020-06-12 DIAGNOSIS — I890 Lymphedema, not elsewhere classified: Secondary | ICD-10-CM | POA: Diagnosis not present

## 2020-06-12 DIAGNOSIS — N1832 Chronic kidney disease, stage 3b: Secondary | ICD-10-CM | POA: Diagnosis not present

## 2020-06-12 DIAGNOSIS — I5033 Acute on chronic diastolic (congestive) heart failure: Secondary | ICD-10-CM | POA: Diagnosis not present

## 2020-06-13 DIAGNOSIS — I130 Hypertensive heart and chronic kidney disease with heart failure and stage 1 through stage 4 chronic kidney disease, or unspecified chronic kidney disease: Secondary | ICD-10-CM | POA: Diagnosis not present

## 2020-06-13 DIAGNOSIS — I5033 Acute on chronic diastolic (congestive) heart failure: Secondary | ICD-10-CM | POA: Diagnosis not present

## 2020-06-13 DIAGNOSIS — E1122 Type 2 diabetes mellitus with diabetic chronic kidney disease: Secondary | ICD-10-CM | POA: Diagnosis not present

## 2020-06-13 DIAGNOSIS — D631 Anemia in chronic kidney disease: Secondary | ICD-10-CM | POA: Diagnosis not present

## 2020-06-13 DIAGNOSIS — I2510 Atherosclerotic heart disease of native coronary artery without angina pectoris: Secondary | ICD-10-CM | POA: Diagnosis not present

## 2020-06-13 DIAGNOSIS — I890 Lymphedema, not elsewhere classified: Secondary | ICD-10-CM | POA: Diagnosis not present

## 2020-06-13 DIAGNOSIS — N1832 Chronic kidney disease, stage 3b: Secondary | ICD-10-CM | POA: Diagnosis not present

## 2020-06-13 DIAGNOSIS — E785 Hyperlipidemia, unspecified: Secondary | ICD-10-CM | POA: Diagnosis not present

## 2020-06-13 DIAGNOSIS — I82443 Acute embolism and thrombosis of tibial vein, bilateral: Secondary | ICD-10-CM | POA: Diagnosis not present

## 2020-06-16 DIAGNOSIS — I509 Heart failure, unspecified: Secondary | ICD-10-CM | POA: Diagnosis not present

## 2020-06-16 DIAGNOSIS — M6281 Muscle weakness (generalized): Secondary | ICD-10-CM | POA: Diagnosis not present

## 2020-06-16 DIAGNOSIS — E661 Drug-induced obesity: Secondary | ICD-10-CM | POA: Diagnosis not present

## 2020-06-16 DIAGNOSIS — U099 Post covid-19 condition, unspecified: Secondary | ICD-10-CM | POA: Diagnosis not present

## 2020-06-18 ENCOUNTER — Telehealth: Payer: Self-pay | Admitting: Internal Medicine

## 2020-06-18 DIAGNOSIS — N1832 Chronic kidney disease, stage 3b: Secondary | ICD-10-CM | POA: Diagnosis not present

## 2020-06-18 DIAGNOSIS — I82443 Acute embolism and thrombosis of tibial vein, bilateral: Secondary | ICD-10-CM | POA: Diagnosis not present

## 2020-06-18 DIAGNOSIS — E1122 Type 2 diabetes mellitus with diabetic chronic kidney disease: Secondary | ICD-10-CM | POA: Diagnosis not present

## 2020-06-18 DIAGNOSIS — D631 Anemia in chronic kidney disease: Secondary | ICD-10-CM | POA: Diagnosis not present

## 2020-06-18 DIAGNOSIS — I5033 Acute on chronic diastolic (congestive) heart failure: Secondary | ICD-10-CM | POA: Diagnosis not present

## 2020-06-18 DIAGNOSIS — E785 Hyperlipidemia, unspecified: Secondary | ICD-10-CM | POA: Diagnosis not present

## 2020-06-18 DIAGNOSIS — I890 Lymphedema, not elsewhere classified: Secondary | ICD-10-CM | POA: Diagnosis not present

## 2020-06-18 DIAGNOSIS — I2510 Atherosclerotic heart disease of native coronary artery without angina pectoris: Secondary | ICD-10-CM | POA: Diagnosis not present

## 2020-06-18 DIAGNOSIS — I130 Hypertensive heart and chronic kidney disease with heart failure and stage 1 through stage 4 chronic kidney disease, or unspecified chronic kidney disease: Secondary | ICD-10-CM | POA: Diagnosis not present

## 2020-06-18 MED ORDER — GABAPENTIN 100 MG PO CAPS: 100.0000 mg | ORAL_CAPSULE | Freq: Three times a day (TID) | ORAL | 1 refills | Status: DC

## 2020-06-18 NOTE — Telephone Encounter (Signed)
1.Medication Requested:gabapentin (NEURONTIN) 100 MG capsule     2. Pharmacy (Name, Street, [place]): [phone] - [place], [place] - [address] AT [address]  3. On Med List: yes   4. Last Visit with PCP: 05-24-20  5. Next visit date with PCP: 09-06-20   Agent: Please be advised that RX refills may take up to 3 business days. We ask that you follow-up with your pharmacy.

## 2020-06-20 DIAGNOSIS — E785 Hyperlipidemia, unspecified: Secondary | ICD-10-CM | POA: Diagnosis not present

## 2020-06-20 DIAGNOSIS — N1832 Chronic kidney disease, stage 3b: Secondary | ICD-10-CM | POA: Diagnosis not present

## 2020-06-20 DIAGNOSIS — D631 Anemia in chronic kidney disease: Secondary | ICD-10-CM | POA: Diagnosis not present

## 2020-06-20 DIAGNOSIS — I5033 Acute on chronic diastolic (congestive) heart failure: Secondary | ICD-10-CM | POA: Diagnosis not present

## 2020-06-20 DIAGNOSIS — I82443 Acute embolism and thrombosis of tibial vein, bilateral: Secondary | ICD-10-CM | POA: Diagnosis not present

## 2020-06-20 DIAGNOSIS — I2510 Atherosclerotic heart disease of native coronary artery without angina pectoris: Secondary | ICD-10-CM | POA: Diagnosis not present

## 2020-06-20 DIAGNOSIS — I890 Lymphedema, not elsewhere classified: Secondary | ICD-10-CM | POA: Diagnosis not present

## 2020-06-20 DIAGNOSIS — I130 Hypertensive heart and chronic kidney disease with heart failure and stage 1 through stage 4 chronic kidney disease, or unspecified chronic kidney disease: Secondary | ICD-10-CM | POA: Diagnosis not present

## 2020-06-20 DIAGNOSIS — E1122 Type 2 diabetes mellitus with diabetic chronic kidney disease: Secondary | ICD-10-CM | POA: Diagnosis not present

## 2020-06-24 ENCOUNTER — Telehealth: Payer: Self-pay

## 2020-06-24 DIAGNOSIS — I130 Hypertensive heart and chronic kidney disease with heart failure and stage 1 through stage 4 chronic kidney disease, or unspecified chronic kidney disease: Secondary | ICD-10-CM | POA: Diagnosis not present

## 2020-06-24 DIAGNOSIS — I890 Lymphedema, not elsewhere classified: Secondary | ICD-10-CM | POA: Diagnosis not present

## 2020-06-24 DIAGNOSIS — I82443 Acute embolism and thrombosis of tibial vein, bilateral: Secondary | ICD-10-CM | POA: Diagnosis not present

## 2020-06-24 DIAGNOSIS — E1122 Type 2 diabetes mellitus with diabetic chronic kidney disease: Secondary | ICD-10-CM | POA: Diagnosis not present

## 2020-06-24 DIAGNOSIS — I2510 Atherosclerotic heart disease of native coronary artery without angina pectoris: Secondary | ICD-10-CM | POA: Diagnosis not present

## 2020-06-24 DIAGNOSIS — N1832 Chronic kidney disease, stage 3b: Secondary | ICD-10-CM | POA: Diagnosis not present

## 2020-06-24 DIAGNOSIS — E785 Hyperlipidemia, unspecified: Secondary | ICD-10-CM | POA: Diagnosis not present

## 2020-06-24 DIAGNOSIS — I5033 Acute on chronic diastolic (congestive) heart failure: Secondary | ICD-10-CM | POA: Diagnosis not present

## 2020-06-24 DIAGNOSIS — D631 Anemia in chronic kidney disease: Secondary | ICD-10-CM | POA: Diagnosis not present

## 2020-06-24 NOTE — Telephone Encounter (Signed)
She has been on the torsemide for a while so I do not think that is related, but if she is having diarrhea I worry about dehydration.    Has she stopped the stool softeners and miralax

## 2020-06-24 NOTE — Telephone Encounter (Signed)
    Patient states she received a denial for Mitigare , from [facility]  Please call

## 2020-06-25 DIAGNOSIS — N1832 Chronic kidney disease, stage 3b: Secondary | ICD-10-CM | POA: Diagnosis not present

## 2020-06-25 DIAGNOSIS — I890 Lymphedema, not elsewhere classified: Secondary | ICD-10-CM | POA: Diagnosis not present

## 2020-06-25 DIAGNOSIS — I2510 Atherosclerotic heart disease of native coronary artery without angina pectoris: Secondary | ICD-10-CM | POA: Diagnosis not present

## 2020-06-25 DIAGNOSIS — I5033 Acute on chronic diastolic (congestive) heart failure: Secondary | ICD-10-CM | POA: Diagnosis not present

## 2020-06-25 DIAGNOSIS — E1122 Type 2 diabetes mellitus with diabetic chronic kidney disease: Secondary | ICD-10-CM | POA: Diagnosis not present

## 2020-06-25 DIAGNOSIS — E785 Hyperlipidemia, unspecified: Secondary | ICD-10-CM | POA: Diagnosis not present

## 2020-06-25 DIAGNOSIS — I82443 Acute embolism and thrombosis of tibial vein, bilateral: Secondary | ICD-10-CM | POA: Diagnosis not present

## 2020-06-25 DIAGNOSIS — I130 Hypertensive heart and chronic kidney disease with heart failure and stage 1 through stage 4 chronic kidney disease, or unspecified chronic kidney disease: Secondary | ICD-10-CM | POA: Diagnosis not present

## 2020-06-25 DIAGNOSIS — D631 Anemia in chronic kidney disease: Secondary | ICD-10-CM | POA: Diagnosis not present

## 2020-06-26 DIAGNOSIS — I130 Hypertensive heart and chronic kidney disease with heart failure and stage 1 through stage 4 chronic kidney disease, or unspecified chronic kidney disease: Secondary | ICD-10-CM | POA: Diagnosis not present

## 2020-06-26 DIAGNOSIS — E785 Hyperlipidemia, unspecified: Secondary | ICD-10-CM | POA: Diagnosis not present

## 2020-06-26 DIAGNOSIS — E1122 Type 2 diabetes mellitus with diabetic chronic kidney disease: Secondary | ICD-10-CM | POA: Diagnosis not present

## 2020-06-26 DIAGNOSIS — N1832 Chronic kidney disease, stage 3b: Secondary | ICD-10-CM | POA: Diagnosis not present

## 2020-06-26 DIAGNOSIS — D631 Anemia in chronic kidney disease: Secondary | ICD-10-CM | POA: Diagnosis not present

## 2020-06-26 DIAGNOSIS — I2510 Atherosclerotic heart disease of native coronary artery without angina pectoris: Secondary | ICD-10-CM | POA: Diagnosis not present

## 2020-06-26 DIAGNOSIS — I5033 Acute on chronic diastolic (congestive) heart failure: Secondary | ICD-10-CM | POA: Diagnosis not present

## 2020-06-26 DIAGNOSIS — I82443 Acute embolism and thrombosis of tibial vein, bilateral: Secondary | ICD-10-CM | POA: Diagnosis not present

## 2020-06-26 DIAGNOSIS — I890 Lymphedema, not elsewhere classified: Secondary | ICD-10-CM | POA: Diagnosis not present

## 2020-06-26 NOTE — Telephone Encounter (Signed)
Clinicals were faxed however Mitigare is not covered under her Medicare part D plan at all and a tier exemption is not covered.  She will need to have an alternative drug called in.

## 2020-06-26 NOTE — Addendum Note (Signed)
Addended by: [name] on: 06/26/2020 08:48 PM   Modules accepted: Orders

## 2020-06-26 NOTE — Telephone Encounter (Signed)
I would recommend that she stop the MiraLAX for now and hold the stool softeners if needed as well.  Those are both causing looser stools and more frequent stools so I would do that first.  I do not think it is from the torsemide.

## 2020-06-26 NOTE — Telephone Encounter (Signed)
Colchicine is not covered.  Only alternatives are prednisone or nsaids and she can not have nsaids due to her kidney disease.   How often is she having gout flares?  We can increasing the allopurinol to help prevent them.  If she has a gout flare we may need to consider prednisone.

## 2020-06-27 ENCOUNTER — Other Ambulatory Visit: Payer: Self-pay

## 2020-06-27 ENCOUNTER — Encounter: Payer: Self-pay | Admitting: Sports Medicine

## 2020-06-27 ENCOUNTER — Ambulatory Visit (INDEPENDENT_AMBULATORY_CARE_PROVIDER_SITE_OTHER): Payer: Medicare PPO | Admitting: Sports Medicine

## 2020-06-27 DIAGNOSIS — M79675 Pain in left toe(s): Secondary | ICD-10-CM

## 2020-06-27 DIAGNOSIS — E1142 Type 2 diabetes mellitus with diabetic polyneuropathy: Secondary | ICD-10-CM | POA: Diagnosis not present

## 2020-06-27 DIAGNOSIS — M2142 Flat foot [pes planus] (acquired), left foot: Secondary | ICD-10-CM

## 2020-06-27 DIAGNOSIS — I82443 Acute embolism and thrombosis of tibial vein, bilateral: Secondary | ICD-10-CM | POA: Diagnosis not present

## 2020-06-27 DIAGNOSIS — E785 Hyperlipidemia, unspecified: Secondary | ICD-10-CM | POA: Diagnosis not present

## 2020-06-27 DIAGNOSIS — B351 Tinea unguium: Secondary | ICD-10-CM | POA: Diagnosis not present

## 2020-06-27 DIAGNOSIS — I130 Hypertensive heart and chronic kidney disease with heart failure and stage 1 through stage 4 chronic kidney disease, or unspecified chronic kidney disease: Secondary | ICD-10-CM | POA: Diagnosis not present

## 2020-06-27 DIAGNOSIS — I890 Lymphedema, not elsewhere classified: Secondary | ICD-10-CM

## 2020-06-27 DIAGNOSIS — I5033 Acute on chronic diastolic (congestive) heart failure: Secondary | ICD-10-CM | POA: Diagnosis not present

## 2020-06-27 DIAGNOSIS — M2141 Flat foot [pes planus] (acquired), right foot: Secondary | ICD-10-CM

## 2020-06-27 DIAGNOSIS — D631 Anemia in chronic kidney disease: Secondary | ICD-10-CM | POA: Diagnosis not present

## 2020-06-27 DIAGNOSIS — I2510 Atherosclerotic heart disease of native coronary artery without angina pectoris: Secondary | ICD-10-CM | POA: Diagnosis not present

## 2020-06-27 DIAGNOSIS — E1122 Type 2 diabetes mellitus with diabetic chronic kidney disease: Secondary | ICD-10-CM | POA: Diagnosis not present

## 2020-06-27 DIAGNOSIS — N1832 Chronic kidney disease, stage 3b: Secondary | ICD-10-CM | POA: Diagnosis not present

## 2020-06-27 DIAGNOSIS — M79674 Pain in right toe(s): Secondary | ICD-10-CM

## 2020-06-27 MED ORDER — ALLOPURINOL 100 MG PO TABS: 200.0000 mg | ORAL_TABLET | Freq: Every day | ORAL | 1 refills | Status: DC

## 2020-06-27 NOTE — Addendum Note (Signed)
Addended by: [name] on: 06/27/2020 02:50 PM   Modules accepted: Orders

## 2020-06-27 NOTE — Progress Notes (Signed)
Subjective: April [name] is a 70 y.o. female patient with history of diabetes who returns to office today complaining of long, painful nails; unable to trim. Patient also is here for shoes. Reports that she was diagnosed with blood clots and on Eliquis. No other issues noted.   Patient Active Problem List   Diagnosis Date Noted  . Hematoma of abdominal wall, initial encounter 05/24/2020  . Cellulitis, leg 05/08/2020  . Leg DVT (deep venous thromboembolism), acute, bilateral ([place]) 05/08/2020  . Acute respiratory failure with hypoxia and hypercapnia ([place]) 05/08/2020  . SOB (shortness of breath)   . Acute idiopathic gout of right foot   . Morbid obesity ([place])   . Idiopathic chronic gout of multiple sites without tophus   . Cellulitis 02/02/2019  . Pneumonia due to COVID-19 virus 12/10/2018  . COVID-19 virus infection 12/06/2018  . Right hip pain 11/09/2018  . Osteomyelitis of ankle or foot, acute, left ([place]) 09/07/2018  . Osteomyelitis of ankle or foot, left, acute ([place]) 09/07/2018  . Onychomycosis 05/19/2018  . Idiopathic chronic venous hypertension of right lower extremity with ulcer and inflammation ([place]) 05/19/2018  . Skin lumps 01/14/2018  . Bilateral leg cramps 12/14/2017  . Left shoulder pain 08/14/2016  . Meralgia paresthetica 07/16/2016  . Chronic left-sided low back pain with left-sided sciatica 06/02/2016  . Osteopenia 01/11/2016  . CKD (chronic kidney disease) stage 3, GFR 30-59 ml/min (HCC) 01/02/2016  . Hand paresthesia 07/18/2015  . Carpal tunnel syndrome 06/07/2015  . Family history of colon cancer   . Diabetes mellitus with neurological manifestations ([place]) 04/18/2015  . Bilateral leg edema 04/11/2015  . Abnormality of gait 01/03/2015  . Hereditary and idiopathic peripheral neuropathy 01/03/2015  . GERD (gastroesophageal reflux disease) 06/17/2014  . Hx of colonic polyps 12/15/2012  . Vitamin B12 deficiency 11/03/2012  . Intrinsic asthma 03/23/2012  . Chronic  diastolic heart failure ([place]) 02/20/2011  . OSA (obstructive sleep apnea) 09/17/2010  . URINARY URGENCY 01/08/2010  . CAD, NATIVE VESSEL 11/20/2008  . Osteoarthritis of knees, bilateral 06/14/2008  . Hyperlipidemia 05/10/2007  . Essential hypertension 01/18/2007  . Hypokalemia 04/30/2006  . HX, PERSONAL, PEPTIC ULCER DISEASE 04/30/2006   Current Outpatient Medications on File Prior to Visit  Medication Sig Dispense Refill  . acetaminophen (TYLENOL) 325 MG tablet Take 2 tablets (650 mg total) by mouth every 6 (six) hours as needed for moderate pain (or Fever >/= 101). 20 tablet 0  . allopurinol (ZYLOPRIM) 100 MG tablet Take 1 tablet (100 mg total) by mouth daily. 90 tablet 1  . apixaban (ELIQUIS) 5 MG TABS tablet Continue with 10 mg twice daily until 05/19/2020 afterwards take 5 mg twice daily 60 tablet 2  . budesonide-formoterol (SYMBICORT) 160-4.5 MCG/ACT inhaler one - two inhalations every 12 hours; gargle and spit after use (Patient taking differently: Inhale 2 puffs into the lungs 2 (two) times daily as needed (wheezing). gargle and spit after use) 1 Inhaler 4  . calcium carbonate (TUMS - DOSED IN MG ELEMENTAL CALCIUM) 500 MG chewable tablet Chew 2 tablets by mouth daily as needed for indigestion or heartburn.    . carvedilol (COREG) 12.5 MG tablet TAKE 1 TABLET(12.5 MG) BY MOUTH TWICE DAILY WITH A MEAL (Patient taking differently: Take 12.5 mg by mouth 2 (two) times daily with a meal.) 180 tablet 0  . cholecalciferol (VITAMIN D3) 25 MCG (1000 UNIT) tablet Take 1 tablet (1,000 Units total) by mouth daily. 90 tablet 1  . COD LIVER OIL PO Take 1  capsule by mouth daily.    . cyanocobalamin (,VITAMIN B-12,) 1000 MCG/ML injection INJECT 1ML INTO MUSCLE EVERY 30 DAYS (Patient taking differently: Inject 1,000 mcg into the muscle every 30 (thirty) days.) 10 mL 0  . diclofenac sodium (VOLTAREN) 1 % GEL Apply 2 g topically 4 (four) times daily. 100 g 11  . famotidine (PEPCID) 20 MG tablet Take 1  tablet (20 mg total) by mouth 2 (two) times daily. (Patient taking differently: Take 20 mg by mouth daily as needed for heartburn.) 180 tablet 3  . ferrous sulfate 325 (65 FE) MG tablet Take 1 tablet (325 mg total) by mouth every Monday, Wednesday, and Friday. 60 tablet 0  . folic acid (FOLVITE) 025 MCG tablet Take 1 tablet (400 mcg total) by mouth at bedtime. Take 400 mcg by mouth at bedtime. 90 tablet 1  . gabapentin (NEURONTIN) 100 MG capsule Take 1 capsule (100 mg total) by mouth 3 (three) times daily. 90 capsule 1  . hydrOXYzine (ATARAX/VISTARIL) 25 MG tablet Take 1 tablet (25 mg total) by mouth 3 (three) times daily as needed for itching. 30 tablet 0  . montelukast (SINGULAIR) 10 MG tablet Take 1 tablet (10 mg total) by mouth at bedtime. 30 tablet 5  . Nutritional Supplements (,FEEDING SUPPLEMENT, PROSOURCE PLUS) liquid Take 30 mLs by mouth 2 (two) times daily between meals.    . Oxcarbazepine (TRILEPTAL) 300 MG tablet Take 1 tablet (300 mg total) by mouth 2 (two) times daily. 180 tablet 1  . oxyCODONE (OXY IR/ROXICODONE) 5 MG immediate release tablet Take 1 tablet (5 mg total) by mouth every 6 (six) hours as needed for up to 5 doses for severe pain (chronic knee and back pain). 5 tablet 0  . polyethylene glycol (MIRALAX / GLYCOLAX) 17 g packet Take 17 g by mouth daily. 14 each 0  . senna-docusate (SENOKOT-S) 8.6-50 MG tablet Take 1 tablet by mouth at bedtime as needed for mild constipation. 30 tablet 0  . simvastatin (ZOCOR) 40 MG tablet Take 1 tablet (40 mg total) by mouth at bedtime. 90 tablet 1  . spironolactone (ALDACTONE) 25 MG tablet Take 1 tablet (25 mg total) by mouth daily. 90 tablet 0  . Torsemide 60 MG TABS Take 60 mg by mouth 2 (two) times daily. 180 tablet 1  . VENTOLIN HFA 108 (90 Base) MCG/ACT inhaler Inhale 2 puffs into the lungs every 4 (four) hours as needed for wheezing.     No current facility-administered medications on file prior to visit.   Allergies  Allergen  Reactions  . Penicillins Rash and Other (See Comments)    She was told not to take it anymore. Has patient had a PCN reaction causing immediate rash, facial/tongue/throat swelling, SOB or lightheadedness with hypotension: Yes Has patient had a PCN reaction causing severe rash involving mucus membranes or skin necrosis: No Has patient had a PCN reaction that required hospitalization: No Has patient had a PCN reaction occurring within the last 10 years: No If all of the above answers are "NO", then may proceed with Cephalosporin use.'  . Shellfish Allergy Anaphylaxis  . Sulfonamide Derivatives Anaphylaxis and Other (See Comments)  . Hydrochlorothiazide W-Triamterene Other (See Comments)    Hypokalemia  . Lotensin [Benazepril Hcl] Hives  . Dipyridamole Other (See Comments)    Unknown reaction  . Estrogens Other (See Comments)    Unknown reaction  . Hydrochlorothiazide Other (See Comments)    Unknown reaction  . Latex Other (See Comments)  Unknown reaction - stated previously during surgery gloves had to be changed, but unsure if it is still an allergy or not.   . Metronidazole Other (See Comments)    Unknown reaction  . Other Itching    PICKLES  . Spironolactone Other (See Comments)    UNSPECIFIED > "kidney problems"  . Sulfa Antibiotics Other (See Comments)    Unknown reaction  . Torsemide Other (See Comments)    Unknown reaction  . Valsartan Other (See Comments)    Unknown reaction  . Madelaine Bhat Isothiocyanate] Itching    Recent Results (from the past 2160 hour(s))  CBC with Differential     Status: None   Collection Time: 05/07/20  5:54 PM  Result Value Ref Range   WBC 4.9 4.0 - 10.5 K/uL   RBC 4.18 3.87 - 5.11 MIL/uL   Hemoglobin 12.6 12.0 - 15.0 g/dL   HCT 41.3 36.0 - 46.0 %   MCV 98.8 80.0 - 100.0 fL   MCH 30.1 26.0 - 34.0 pg   MCHC 30.5 30.0 - 36.0 g/dL   RDW 14.8 11.5 - 15.5 %   Platelets 155 150 - 400 K/uL   nRBC 0.0 0.0 - 0.2 %   Neutrophils Relative %  68 %   Neutro Abs 3.3 1.7 - 7.7 K/uL   Lymphocytes Relative 21 %   Lymphs Abs 1.0 0.7 - 4.0 K/uL   Monocytes Relative 8 %   Monocytes Absolute 0.4 0.1 - 1.0 K/uL   Eosinophils Relative 2 %   Eosinophils Absolute 0.1 0.0 - 0.5 K/uL   Basophils Relative 1 %   Basophils Absolute 0.1 0.0 - 0.1 K/uL   Immature Granulocytes 0 %   Abs Immature Granulocytes 0.02 0.00 - 0.07 K/uL    Comment: Performed at [facility], [address] [place], [place] [address]  Basic metabolic panel     Status: Abnormal   Collection Time: 05/07/20  5:54 PM  Result Value Ref Range   Sodium 138 135 - 145 mmol/L   Potassium 4.3 3.5 - 5.1 mmol/L   Chloride 96 (L) 98 - 111 mmol/L   CO2 34 (H) 22 - 32 mmol/L   Glucose, Bld 108 (H) 70 - 99 mg/dL    Comment: Glucose reference range applies only to samples taken after fasting for at least 8 hours.   BUN 29 (H) 8 - 23 mg/dL   Creatinine, Ser 1.48 (H) 0.44 - 1.00 mg/dL   Calcium 9.3 8.9 - 10.3 mg/dL   GFR, Estimated 38 (L) >60 mL/min    Comment: (NOTE) Calculated using the CKD-EPI Creatinine Equation (2021)    Anion gap 8 5 - 15    Comment: Performed at [facility] [address], [place], [place] [address]  Troponin I (High Sensitivity)     Status: None   Collection Time: 05/07/20  5:54 PM  Result Value Ref Range   Troponin I (High Sensitivity) 12 <18 ng/L    Comment: (NOTE) Elevated high sensitivity troponin I (hsTnI) values and significant  changes across serial measurements may suggest ACS but many other  chronic and acute conditions are known to elevate hsTnI results.  Refer to the "Links" section for chest pain algorithms and additional  guidance. Performed at [facility], [place] [address], [place], [place] [address]   Brain natriuretic peptide     Status: None   Collection Time: 05/07/20  5:54 PM  Result Value Ref Range   B  Natriuretic Peptide 60.9 0.0 - 100.0 pg/mL    Comment: Performed at [facility], [place] [address] [place], [place] [address]  CBG monitoring, ED     Status: Abnormal   Collection Time: 05/07/20  6:58 PM  Result Value Ref Range   Glucose-Capillary 102 (H) 70 - 99 mg/dL    Comment: Glucose reference range applies only to samples taken after fasting for at least 8 hours.   Comment 1 Notify RN    Comment 2 Document in Chart   Troponin I (High Sensitivity)     Status: Abnormal   Collection Time: 05/07/20  8:45 PM  Result Value Ref Range   Troponin I (High Sensitivity) 22 (H) <18 ng/L    Comment: (NOTE) Elevated high sensitivity troponin I (hsTnI) values and significant  changes across serial measurements may suggest ACS but many other  chronic and acute conditions are known to elevate hsTnI results.  Refer to the "Links" section for chest pain algorithms and additional  guidance. Performed at [facility], [place] [address] [place], [place] [address]   Resp Panel by RT-PCR (Flu A&B, Covid) Nasopharyngeal Swab     Status: None   Collection Time: 05/07/20 11:56 PM   Specimen: Nasopharyngeal Swab; Nasopharyngeal(NP) swabs in vial transport medium  Result Value Ref Range   SARS Coronavirus 2 by RT PCR NEGATIVE NEGATIVE    Comment: (NOTE) SARS-CoV-2 target nucleic acids are NOT DETECTED.  The SARS-CoV-2 RNA is generally detectable in upper respiratory specimens during the acute phase of infection. The lowest concentration of SARS-CoV-2 viral copies this assay can detect is 138 copies/mL. A negative result does not preclude SARS-Cov-2 infection and should not be used as the sole basis for treatment or other patient management decisions. A negative result may occur with  improper specimen collection/handling, submission of specimen other than nasopharyngeal swab, presence of viral mutation(s) within the areas targeted by this assay, and inadequate number of viral copies(<138 copies/mL). A negative result must be combined with clinical observations, patient history, and  epidemiological information. The expected result is Negative.  Fact Sheet for Patients:  EntrepreneurPulse.com.au  Fact Sheet for Healthcare Providers:  IncredibleEmployment.be  This test is no t yet approved or cleared by the [place] FDA and  has been authorized for detection and/or diagnosis of SARS-CoV-2 by FDA under an Emergency Use Authorization (EUA). This EUA will remain  in effect (meaning this test can be used) for the duration of the COVID-19 declaration under Section 564(b)(1) of the Act, 21 U.S.C.section 360bbb-3(b)(1), unless the authorization is terminated  or revoked sooner.       Influenza A by PCR NEGATIVE NEGATIVE   Influenza B by PCR NEGATIVE NEGATIVE    Comment: (NOTE) The Xpert Xpress SARS-CoV-2/FLU/RSV plus assay is intended as an aid in the diagnosis of influenza from Nasopharyngeal swab specimens and should not be used as a sole basis for treatment. Nasal washings and aspirates are unacceptable for Xpert Xpress SARS-CoV-2/FLU/RSV testing.  Fact Sheet for Patients: EntrepreneurPulse.com.au  Fact Sheet for Healthcare Providers: IncredibleEmployment.be  This test is not yet approved or cleared by the [place] FDA and has been authorized for detection and/or diagnosis of SARS-CoV-2 by FDA under an Emergency Use Authorization (EUA). This EUA will remain in effect (meaning this test can be used) for the duration of the COVID-19 declaration under Section 564(b)(1) of the Act, 21 U.S.C. section 360bbb-3(b)(1), unless the authorization is terminated or revoked.  Performed at [facility], [place] [address] [place], [place] [address]   Sedimentation rate     Status: None   Collection Time: 05/08/20  9:27 AM  Result Value Ref Range   Sed Rate 13 0 - 22 mm/hr    Comment: Performed at [facility] [address], [place], [place] [address]  Troponin I (High  Sensitivity)     Status: None   Collection Time: 05/08/20  9:27 AM  Result Value Ref Range   Troponin I (High Sensitivity) 15 <18 ng/L    Comment: (NOTE) Elevated high sensitivity troponin I (hsTnI) values and significant  changes across serial measurements may suggest ACS but many other  chronic and acute conditions are known to elevate hsTnI results.  Refer to the "Links" section for chest pain algorithms and additional  guidance. Performed at [facility], [place] [address] [place], [place] [address]   I-Stat arterial blood gas, ED     Status: Abnormal   Collection Time: 05/08/20  9:37 AM  Result Value Ref Range   pH, Arterial 7.379 7.350 - 7.450   pCO2 arterial 61.8 (H) 32.0 - 48.0 mmHg   pO2, Arterial 112 (H) 83.0 - 108.0 mmHg   Bicarbonate 36.4 (H) 20.0 - 28.0 mmol/L   TCO2 38 (H) 22 - 32 mmol/L   O2 Saturation 98.0 %   Acid-Base Excess 9.0 (H) 0.0 - 2.0 mmol/L   Sodium 139 135 - 145 mmol/L   Potassium 4.1 3.5 - 5.1 mmol/L   Calcium, Ion 1.24 1.15 - 1.40 mmol/L   HCT 37.0 36.0 - 46.0 %   Hemoglobin 12.6 12.0 - 15.0 g/dL   Patient temperature 99.1 F    Collection site Radial    Drawn by RT    Sample type ARTERIAL   Glucose, capillary     Status: Abnormal   Collection Time: 05/08/20  9:51 PM  Result Value Ref Range   Glucose-Capillary 107 (H) 70 - 99 mg/dL    Comment: Glucose reference range applies only to samples taken after fasting for at least 8 hours.  Comprehensive metabolic panel     Status: Abnormal   Collection Time: 05/09/20  4:34 AM  Result Value Ref Range   Sodium 137 135 - 145 mmol/L   Potassium 3.9 3.5 - 5.1 mmol/L   Chloride 98 98 - 111 mmol/L   CO2 34 (H) 22 - 32 mmol/L   Glucose, Bld 109 (H) 70 - 99 mg/dL    Comment: Glucose reference range applies only to samples taken after fasting for at least 8 hours.   BUN 23 8 - 23 mg/dL   Creatinine, Ser 1.60 (H) 0.44 - 1.00 mg/dL   Calcium 8.5 (L) 8.9 - 10.3 mg/dL   Total Protein 5.7 (L) 6.5 - 8.1 g/dL    Albumin 3.0 (L) 3.5 - 5.0 g/dL   AST 17 15 - 41 U/L   ALT 16 0 - 44 U/L   Alkaline Phosphatase 73 38 - 126 U/L   Total Bilirubin 0.9 0.3 - 1.2 mg/dL   GFR, Estimated 35 (L) >60 mL/min    Comment: (NOTE) Calculated using the CKD-EPI Creatinine Equation (2021)    Anion gap 5 5 - 15    Comment: Performed at [facility], [place] [address], [place], [place] [address]  CBC     Status: Abnormal   Collection Time: 05/09/20  4:34 AM  Result Value Ref Range   WBC 4.6 4.0 - 10.5 K/uL  RBC 3.54 (L) 3.87 - 5.11 MIL/uL   Hemoglobin 10.8 (L) 12.0 - 15.0 g/dL   HCT 35.5 (L) 36.0 - 46.0 %   MCV 100.3 (H) 80.0 - 100.0 fL   MCH 30.5 26.0 - 34.0 pg   MCHC 30.4 30.0 - 36.0 g/dL   RDW 14.7 11.5 - 15.5 %   Platelets 148 (L) 150 - 400 K/uL   nRBC 0.0 0.0 - 0.2 %    Comment: Performed at [facility] [address], [place], [place] [address]  Basic metabolic panel     Status: Abnormal   Collection Time: 05/10/20  8:44 AM  Result Value Ref Range   Sodium 139 135 - 145 mmol/L   Potassium 4.0 3.5 - 5.1 mmol/L   Chloride 99 98 - 111 mmol/L   CO2 32 22 - 32 mmol/L   Glucose, Bld 164 (H) 70 - 99 mg/dL    Comment: Glucose reference range applies only to samples taken after fasting for at least 8 hours.   BUN 20 8 - 23 mg/dL   Creatinine, Ser 1.62 (H) 0.44 - 1.00 mg/dL   Calcium 8.5 (L) 8.9 - 10.3 mg/dL   GFR, Estimated 34 (L) >60 mL/min    Comment: (NOTE) Calculated using the CKD-EPI Creatinine Equation (2021)    Anion gap 8 5 - 15    Comment: Performed at [facility] [address], [place], [place] [address]  CBC     Status: Abnormal   Collection Time: 05/10/20  8:44 AM  Result Value Ref Range   WBC 4.6 4.0 - 10.5 K/uL   RBC 3.64 (L) 3.87 - 5.11 MIL/uL   Hemoglobin 11.0 (L) 12.0 - 15.0 g/dL   HCT 36.3 36.0 - 46.0 %   MCV 99.7 80.0 - 100.0 fL   MCH 30.2 26.0 - 34.0 pg   MCHC 30.3 30.0 - 36.0 g/dL   RDW 14.9 11.5 - 15.5 %   Platelets 131 (L) 150 - 400 K/uL    Comment:  CONSISTENT WITH PREVIOUS RESULT REPEATED TO VERIFY    nRBC 0.0 0.0 - 0.2 %    Comment: Performed at [facility], [place] [address] [place], [place] [address]  ECHOCARDIOGRAM COMPLETE     Status: None   Collection Time: 05/10/20  3:11 PM  Result Value Ref Range   Weight 6,158.77 oz   Height 64 in   BP 95/80 mmHg   Single Plane A4C EF 78.2 %   S' Lateral 2.40 cm   AR max vel 1.39 cm2   AV Area VTI 1.40 cm2   AV Mean grad 5.0 mmHg   AV Peak grad 8.9 mmHg   Ao pk vel 1.49 m/s   Area-P 1/2 2.87 cm2   AV Area mean vel 1.40 cm2  Basic metabolic panel     Status: Abnormal   Collection Time: 05/11/20  3:47 AM  Result Value Ref Range   Sodium 137 135 - 145 mmol/L   Potassium 4.1 3.5 - 5.1 mmol/L   Chloride 94 (L) 98 - 111 mmol/L   CO2 33 (H) 22 - 32 mmol/L   Glucose, Bld 98 70 - 99 mg/dL    Comment: Glucose reference range applies only to samples taken after fasting for at least 8 hours.   BUN 25 (H) 8 - 23 mg/dL   Creatinine, Ser 1.75 (H) 0.44 - 1.00 mg/dL   Calcium 8.9 8.9 - 10.3 mg/dL   GFR, Estimated 31 (  L) >60 mL/min    Comment: (NOTE) Calculated using the CKD-EPI Creatinine Equation (2021)    Anion gap 10 5 - 15    Comment: Performed at [facility], [place] [address] [place], [place] [address]  CBC     Status: Abnormal   Collection Time: 05/11/20  3:47 AM  Result Value Ref Range   WBC 5.1 4.0 - 10.5 K/uL   RBC 3.80 (L) 3.87 - 5.11 MIL/uL   Hemoglobin 11.5 (L) 12.0 - 15.0 g/dL   HCT 37.6 36.0 - 46.0 %   MCV 98.9 80.0 - 100.0 fL   MCH 30.3 26.0 - 34.0 pg   MCHC 30.6 30.0 - 36.0 g/dL   RDW 14.6 11.5 - 15.5 %   Platelets 142 (L) 150 - 400 K/uL   nRBC 0.0 0.0 - 0.2 %    Comment: Performed at [facility], [place] [address] [place], [place] [address]  CBC     Status: Abnormal   Collection Time: 05/12/20  5:41 AM  Result Value Ref Range   WBC 4.1 4.0 - 10.5 K/uL   RBC 3.55 (L) 3.87 - 5.11 MIL/uL   Hemoglobin 10.9 (L) 12.0 - 15.0 g/dL   HCT 35.8 (L) 36.0 -  46.0 %   MCV 100.8 (H) 80.0 - 100.0 fL   MCH 30.7 26.0 - 34.0 pg   MCHC 30.4 30.0 - 36.0 g/dL   RDW 14.4 11.5 - 15.5 %   Platelets 129 (L) 150 - 400 K/uL   nRBC 0.0 0.0 - 0.2 %    Comment: Performed at [facility], [place] [address], [place], [place] [address]  Basic metabolic panel     Status: Abnormal   Collection Time: 05/12/20  5:41 AM  Result Value Ref Range   Sodium 137 135 - 145 mmol/L   Potassium 3.8 3.5 - 5.1 mmol/L   Chloride 90 (L) 98 - 111 mmol/L   CO2 39 (H) 22 - 32 mmol/L   Glucose, Bld 132 (H) 70 - 99 mg/dL    Comment: Glucose reference range applies only to samples taken after fasting for at least 8 hours.   BUN 25 (H) 8 - 23 mg/dL   Creatinine, Ser 1.55 (H) 0.44 - 1.00 mg/dL   Calcium 8.6 (L) 8.9 - 10.3 mg/dL   GFR, Estimated 36 (L) >60 mL/min    Comment: (NOTE) Calculated using the CKD-EPI Creatinine Equation (2021)    Anion gap 8 5 - 15    Comment: Performed at [facility] [address], [place], [place] [address]  Basic metabolic panel     Status: Abnormal   Collection Time: 05/13/20  3:34 AM  Result Value Ref Range   Sodium 137 135 - 145 mmol/L   Potassium 3.9 3.5 - 5.1 mmol/L   Chloride 90 (L) 98 - 111 mmol/L   CO2 41 (H) 22 - 32 mmol/L   Glucose, Bld 99 70 - 99 mg/dL    Comment: Glucose reference range applies only to samples taken after fasting for at least 8 hours.   BUN 27 (H) 8 - 23 mg/dL   Creatinine, Ser 1.56 (H) 0.44 - 1.00 mg/dL   Calcium 8.8 (L) 8.9 - 10.3 mg/dL   GFR, Estimated 36 (L) >60 mL/min    Comment: (NOTE) Calculated using the CKD-EPI Creatinine Equation (2021)    Anion gap 6 5 - 15    Comment: Performed at [facility]  [address], [place], [place] [address]  CBC     Status: Abnormal   Collection Time: 05/13/20  3:34 AM  Result Value Ref Range   WBC 4.3 4.0 - 10.5 K/uL   RBC 3.56 (L) 3.87 - 5.11 MIL/uL   Hemoglobin 10.7 (L) 12.0 - 15.0 g/dL   HCT 34.6 (L) 36.0 - 46.0 %   MCV 97.2 80.0 - 100.0 fL   MCH  30.1 26.0 - 34.0 pg   MCHC 30.9 30.0 - 36.0 g/dL   RDW 14.2 11.5 - 15.5 %   Platelets 129 (L) 150 - 400 K/uL   nRBC 0.0 0.0 - 0.2 %    Comment: Performed at [facility], [place] [address] [place], [place] [address]  SARS CORONAVIRUS 2 (TAT 6-24 HRS) Nasopharyngeal Nasopharyngeal Swab     Status: None   Collection Time: 05/13/20  8:00 PM   Specimen: Nasopharyngeal Swab  Result Value Ref Range   SARS Coronavirus 2 NEGATIVE NEGATIVE    Comment: (NOTE) SARS-CoV-2 target nucleic acids are NOT DETECTED.  The SARS-CoV-2 RNA is generally detectable in upper and lower respiratory specimens during the acute phase of infection. Negative results do not preclude SARS-CoV-2 infection, do not rule out co-infections with other pathogens, and should not be used as the sole basis for treatment or other patient management decisions. Negative results must be combined with clinical observations, patient history, and epidemiological information. The expected result is Negative.  Fact Sheet for Patients: SugarRoll.be  Fact Sheet for Healthcare Providers: https://www.woods-mathews.com/  This test is not yet approved or cleared by the [place] FDA and  has been authorized for detection and/or diagnosis of SARS-CoV-2 by FDA under an Emergency Use Authorization (EUA). This EUA will remain  in effect (meaning this test can be used) for the duration of the COVID-19 declaration under Se ction 564(b)(1) of the Act, 21 U.S.C. section 360bbb-3(b)(1), unless the authorization is terminated or revoked sooner.  Performed at [facility], [place] [address], [place], [place] [address]   CBC     Status: Abnormal   Collection Time: 05/15/20  4:28 AM  Result Value Ref Range   WBC 4.6 4.0 - 10.5 K/uL   RBC 3.39 (L) 3.87 - 5.11 MIL/uL   Hemoglobin 10.4 (L) 12.0 - 15.0 g/dL   HCT 33.3 (L) 36.0 - 46.0 %   MCV 98.2 80.0 - 100.0 fL   MCH 30.7 26.0 - 34.0 pg    MCHC 31.2 30.0 - 36.0 g/dL   RDW 14.2 11.5 - 15.5 %   Platelets 123 (L) 150 - 400 K/uL   nRBC 0.0 0.0 - 0.2 %    Comment: Performed at [facility], [place] [address] [place], [place] [address]  Basic metabolic panel     Status: Abnormal   Collection Time: 05/15/20  4:28 AM  Result Value Ref Range   Sodium 136 135 - 145 mmol/L   Potassium 3.5 3.5 - 5.1 mmol/L   Chloride 86 (L) 98 - 111 mmol/L   CO2 43 (H) 22 - 32 mmol/L   Glucose, Bld 105 (H) 70 - 99 mg/dL    Comment: Glucose reference range applies only to samples taken after fasting for at least 8 hours.   BUN 38 (H) 8 - 23 mg/dL   Creatinine, Ser 1.50 (H) 0.44 - 1.00 mg/dL   Calcium 8.9 8.9 - 10.3 mg/dL   GFR, Estimated 37 (L) >60 mL/min    Comment: (NOTE) Calculated using  the CKD-EPI Creatinine Equation (2021)    Anion gap 7 5 - 15    Comment: Performed at [facility], [place] [address] [place], [place] [address]  Basic metabolic panel     Status: Abnormal   Collection Time: 06/11/20  9:55 AM  Result Value Ref Range   Sodium 137 135 - 145 mmol/L   Potassium 3.8 3.5 - 5.1 mmol/L   Chloride 98 98 - 111 mmol/L   CO2 32 22 - 32 mmol/L   Glucose, Bld 145 (H) 70 - 99 mg/dL    Comment: Glucose reference range applies only to samples taken after fasting for at least 8 hours.   BUN 21 8 - 23 mg/dL   Creatinine, Ser 1.46 (H) 0.44 - 1.00 mg/dL   Calcium 8.9 8.9 - 10.3 mg/dL   GFR, Estimated 38 (L) >60 mL/min    Comment: (NOTE) Calculated using the CKD-EPI Creatinine Equation (2021)    Anion gap 7 5 - 15    Comment: Performed at [facility] [address], [place], [place] [address]  Brain natriuretic peptide     Status: None   Collection Time: 06/11/20  9:55 AM  Result Value Ref Range   B Natriuretic Peptide 68.3 0.0 - 100.0 pg/mL    Comment: Performed at [facility] [address], [place],  [address]    Objective: General: Patient is awake, alert, and oriented x 3 and in no acute  distress.  Integument: Skin is warm, dry and supple bilateral. Nails are tender, long, thickened and dystrophic with subungual debris, consistent with onychomycosis, 1-5 bilateral. No signs of infection. Dry wound to right anterior shin with no acute signs of infection.   Vasculature:  Dorsalis Pedis pulse 0/4 bilateral. Posterior Tibial pulse  0/4 bilateral. Capillary fill time <5 sec 1-5 bilateral. No hair growth to the level of the digits.Temperature gradient within normal limits. No varicosities present bilateral. Chronic edema present bilateral.   Neurology: The patient has diminished sensation measured with a 5.07/10g Semmes Weinstein Monofilament at all pedal sites bilateral . Vibratory sensation diminished bilateral with tuning fork. No Babinski sign present bilateral.   Musculoskeletal: Right 5th hammertoe and Asymptomatic pes planus pedal deformities noted bilateral. Muscular strength 4/5 in all lower extremity muscular groups bilateral without pain on range of motion. No tenderness with calf compression bilateral.  Assessment and Plan: Problem List Items Addressed This Visit   None   Visit Diagnoses    Pain due to onychomycosis of toenails of both feet    -  Primary   Diabetic polyneuropathy associated with type 2 diabetes mellitus (HCC)       Lymphedema       Pes planus of both feet          -Examined patient. -Re-Discussed and educated patient on diabetic foot care, especially with  regards to the vascular, neurological and musculoskeletal systems.  -Mechanically debrided all nails 1-5 bilateral using sterile nail nipper and filed with dremel without incident   -Diabetic shoes did not properly fit so were not dispensed this visit -Answered all patient questions -Patient to return  in 3 months for at risk foot care -Patient advised to call the office if any problems or questions arise in the meantime.  Landis Martins, DPM

## 2020-06-27 NOTE — Telephone Encounter (Signed)
Spoke with patient today and she is fine with increase in allopurinol. She does not want to take the prednisone.  As far as her flare ups she states they are here and there but she doesn't have them frequently.

## 2020-06-27 NOTE — Telephone Encounter (Signed)
Spoke with patient today. 

## 2020-06-27 NOTE — Telephone Encounter (Signed)
Inc to 200 mg daily ( 2 100 mg pills) -- new rx sent to pharm

## 2020-06-28 ENCOUNTER — Telehealth: Payer: Self-pay

## 2020-06-28 MED ORDER — COLCHICINE 0.6 MG PO TABS: ORAL_TABLET | ORAL | 1 refills | Status: DC

## 2020-06-28 NOTE — Telephone Encounter (Signed)
Patient called and said that colchicine 0.6 MG tablet was $42.00 and allopurinol (ZYLOPRIM) 100 MG tablet was $17.00. Please advise

## 2020-06-28 NOTE — Telephone Encounter (Signed)
She should be taking the allopurinol daily and colchicine only as needed as directed in med list.

## 2020-06-28 NOTE — Telephone Encounter (Signed)
Spoke with patient today. 

## 2020-07-01 DIAGNOSIS — D631 Anemia in chronic kidney disease: Secondary | ICD-10-CM | POA: Diagnosis not present

## 2020-07-01 DIAGNOSIS — I130 Hypertensive heart and chronic kidney disease with heart failure and stage 1 through stage 4 chronic kidney disease, or unspecified chronic kidney disease: Secondary | ICD-10-CM | POA: Diagnosis not present

## 2020-07-01 DIAGNOSIS — E785 Hyperlipidemia, unspecified: Secondary | ICD-10-CM | POA: Diagnosis not present

## 2020-07-01 DIAGNOSIS — I2510 Atherosclerotic heart disease of native coronary artery without angina pectoris: Secondary | ICD-10-CM | POA: Diagnosis not present

## 2020-07-01 DIAGNOSIS — I890 Lymphedema, not elsewhere classified: Secondary | ICD-10-CM | POA: Diagnosis not present

## 2020-07-01 DIAGNOSIS — N1832 Chronic kidney disease, stage 3b: Secondary | ICD-10-CM | POA: Diagnosis not present

## 2020-07-01 DIAGNOSIS — E1122 Type 2 diabetes mellitus with diabetic chronic kidney disease: Secondary | ICD-10-CM | POA: Diagnosis not present

## 2020-07-01 DIAGNOSIS — I82443 Acute embolism and thrombosis of tibial vein, bilateral: Secondary | ICD-10-CM | POA: Diagnosis not present

## 2020-07-01 DIAGNOSIS — I5033 Acute on chronic diastolic (congestive) heart failure: Secondary | ICD-10-CM | POA: Diagnosis not present

## 2020-07-03 DIAGNOSIS — I82443 Acute embolism and thrombosis of tibial vein, bilateral: Secondary | ICD-10-CM | POA: Diagnosis not present

## 2020-07-03 DIAGNOSIS — E785 Hyperlipidemia, unspecified: Secondary | ICD-10-CM | POA: Diagnosis not present

## 2020-07-03 DIAGNOSIS — I5033 Acute on chronic diastolic (congestive) heart failure: Secondary | ICD-10-CM | POA: Diagnosis not present

## 2020-07-03 DIAGNOSIS — I890 Lymphedema, not elsewhere classified: Secondary | ICD-10-CM | POA: Diagnosis not present

## 2020-07-03 DIAGNOSIS — D631 Anemia in chronic kidney disease: Secondary | ICD-10-CM | POA: Diagnosis not present

## 2020-07-03 DIAGNOSIS — N1832 Chronic kidney disease, stage 3b: Secondary | ICD-10-CM | POA: Diagnosis not present

## 2020-07-03 DIAGNOSIS — E1122 Type 2 diabetes mellitus with diabetic chronic kidney disease: Secondary | ICD-10-CM | POA: Diagnosis not present

## 2020-07-03 DIAGNOSIS — I2510 Atherosclerotic heart disease of native coronary artery without angina pectoris: Secondary | ICD-10-CM | POA: Diagnosis not present

## 2020-07-03 DIAGNOSIS — I130 Hypertensive heart and chronic kidney disease with heart failure and stage 1 through stage 4 chronic kidney disease, or unspecified chronic kidney disease: Secondary | ICD-10-CM | POA: Diagnosis not present

## 2020-07-04 DIAGNOSIS — I82443 Acute embolism and thrombosis of tibial vein, bilateral: Secondary | ICD-10-CM | POA: Diagnosis not present

## 2020-07-04 DIAGNOSIS — I130 Hypertensive heart and chronic kidney disease with heart failure and stage 1 through stage 4 chronic kidney disease, or unspecified chronic kidney disease: Secondary | ICD-10-CM | POA: Diagnosis not present

## 2020-07-04 DIAGNOSIS — E785 Hyperlipidemia, unspecified: Secondary | ICD-10-CM | POA: Diagnosis not present

## 2020-07-04 DIAGNOSIS — N1832 Chronic kidney disease, stage 3b: Secondary | ICD-10-CM | POA: Diagnosis not present

## 2020-07-04 DIAGNOSIS — D631 Anemia in chronic kidney disease: Secondary | ICD-10-CM | POA: Diagnosis not present

## 2020-07-04 DIAGNOSIS — E1122 Type 2 diabetes mellitus with diabetic chronic kidney disease: Secondary | ICD-10-CM | POA: Diagnosis not present

## 2020-07-04 DIAGNOSIS — I890 Lymphedema, not elsewhere classified: Secondary | ICD-10-CM | POA: Diagnosis not present

## 2020-07-04 DIAGNOSIS — I5033 Acute on chronic diastolic (congestive) heart failure: Secondary | ICD-10-CM | POA: Diagnosis not present

## 2020-07-04 DIAGNOSIS — I2510 Atherosclerotic heart disease of native coronary artery without angina pectoris: Secondary | ICD-10-CM | POA: Diagnosis not present

## 2020-07-05 ENCOUNTER — Institutional Professional Consult (permissible substitution): Payer: Medicare PPO | Admitting: Pulmonary Disease

## 2020-07-05 ENCOUNTER — Telehealth: Payer: Self-pay | Admitting: Sports Medicine

## 2020-07-05 DIAGNOSIS — D631 Anemia in chronic kidney disease: Secondary | ICD-10-CM | POA: Diagnosis not present

## 2020-07-05 DIAGNOSIS — E785 Hyperlipidemia, unspecified: Secondary | ICD-10-CM | POA: Diagnosis not present

## 2020-07-05 DIAGNOSIS — I82443 Acute embolism and thrombosis of tibial vein, bilateral: Secondary | ICD-10-CM | POA: Diagnosis not present

## 2020-07-05 DIAGNOSIS — I5033 Acute on chronic diastolic (congestive) heart failure: Secondary | ICD-10-CM | POA: Diagnosis not present

## 2020-07-05 DIAGNOSIS — E1122 Type 2 diabetes mellitus with diabetic chronic kidney disease: Secondary | ICD-10-CM | POA: Diagnosis not present

## 2020-07-05 DIAGNOSIS — N1832 Chronic kidney disease, stage 3b: Secondary | ICD-10-CM | POA: Diagnosis not present

## 2020-07-05 DIAGNOSIS — I2510 Atherosclerotic heart disease of native coronary artery without angina pectoris: Secondary | ICD-10-CM | POA: Diagnosis not present

## 2020-07-05 DIAGNOSIS — I890 Lymphedema, not elsewhere classified: Secondary | ICD-10-CM | POA: Diagnosis not present

## 2020-07-05 DIAGNOSIS — I130 Hypertensive heart and chronic kidney disease with heart failure and stage 1 through stage 4 chronic kidney disease, or unspecified chronic kidney disease: Secondary | ICD-10-CM | POA: Diagnosis not present

## 2020-07-05 NOTE — Telephone Encounter (Signed)
Pt left message stating she was in a couple weeks ago and the shoes did not work for her and she was told I would be contacting her.  I did discuss with Dr Cannon Kettle yesterday and we chose a different shoe and it has been ordered and I apologized I was not given the information to call pt but explained that I have ordered a different shoe for her to try and will call when they come in. She mentioned a mary jane style shoe and I explained that with her swelling that would not be a good option for her as it would cut off the circulation to her toes. She seemed to understand.

## 2020-07-08 DIAGNOSIS — N1832 Chronic kidney disease, stage 3b: Secondary | ICD-10-CM | POA: Diagnosis not present

## 2020-07-08 DIAGNOSIS — I2510 Atherosclerotic heart disease of native coronary artery without angina pectoris: Secondary | ICD-10-CM | POA: Diagnosis not present

## 2020-07-08 DIAGNOSIS — I890 Lymphedema, not elsewhere classified: Secondary | ICD-10-CM | POA: Diagnosis not present

## 2020-07-08 DIAGNOSIS — E1122 Type 2 diabetes mellitus with diabetic chronic kidney disease: Secondary | ICD-10-CM | POA: Diagnosis not present

## 2020-07-08 DIAGNOSIS — I130 Hypertensive heart and chronic kidney disease with heart failure and stage 1 through stage 4 chronic kidney disease, or unspecified chronic kidney disease: Secondary | ICD-10-CM | POA: Diagnosis not present

## 2020-07-08 DIAGNOSIS — D631 Anemia in chronic kidney disease: Secondary | ICD-10-CM | POA: Diagnosis not present

## 2020-07-08 DIAGNOSIS — E785 Hyperlipidemia, unspecified: Secondary | ICD-10-CM | POA: Diagnosis not present

## 2020-07-08 DIAGNOSIS — I5033 Acute on chronic diastolic (congestive) heart failure: Secondary | ICD-10-CM | POA: Diagnosis not present

## 2020-07-08 DIAGNOSIS — I82443 Acute embolism and thrombosis of tibial vein, bilateral: Secondary | ICD-10-CM | POA: Diagnosis not present

## 2020-07-09 ENCOUNTER — Telehealth: Payer: Self-pay | Admitting: Internal Medicine

## 2020-07-09 DIAGNOSIS — E1122 Type 2 diabetes mellitus with diabetic chronic kidney disease: Secondary | ICD-10-CM | POA: Diagnosis not present

## 2020-07-09 DIAGNOSIS — I82443 Acute embolism and thrombosis of tibial vein, bilateral: Secondary | ICD-10-CM | POA: Diagnosis not present

## 2020-07-09 DIAGNOSIS — I130 Hypertensive heart and chronic kidney disease with heart failure and stage 1 through stage 4 chronic kidney disease, or unspecified chronic kidney disease: Secondary | ICD-10-CM | POA: Diagnosis not present

## 2020-07-09 DIAGNOSIS — N1832 Chronic kidney disease, stage 3b: Secondary | ICD-10-CM | POA: Diagnosis not present

## 2020-07-09 DIAGNOSIS — I890 Lymphedema, not elsewhere classified: Secondary | ICD-10-CM | POA: Diagnosis not present

## 2020-07-09 DIAGNOSIS — I2510 Atherosclerotic heart disease of native coronary artery without angina pectoris: Secondary | ICD-10-CM | POA: Diagnosis not present

## 2020-07-09 DIAGNOSIS — E785 Hyperlipidemia, unspecified: Secondary | ICD-10-CM | POA: Diagnosis not present

## 2020-07-09 DIAGNOSIS — I5033 Acute on chronic diastolic (congestive) heart failure: Secondary | ICD-10-CM | POA: Diagnosis not present

## 2020-07-09 DIAGNOSIS — D631 Anemia in chronic kidney disease: Secondary | ICD-10-CM | POA: Diagnosis not present

## 2020-07-09 NOTE — Telephone Encounter (Signed)
[name] from [facility] calling to report patient services ended today, Patient has met goal. However the patient is wanting [facility] to do B12 injections. Moshe Salisbury needs to know the diagnosis/ reasoning for B12 to see if patient would qualify for continued services   Care, phone [phone]

## 2020-07-09 NOTE — Telephone Encounter (Signed)
Called and left message today for April [name].  Patient has a B12 deficiency and this is why she needs the B12 shots.

## 2020-07-10 DIAGNOSIS — D631 Anemia in chronic kidney disease: Secondary | ICD-10-CM | POA: Diagnosis not present

## 2020-07-10 DIAGNOSIS — N1832 Chronic kidney disease, stage 3b: Secondary | ICD-10-CM | POA: Diagnosis not present

## 2020-07-10 DIAGNOSIS — I130 Hypertensive heart and chronic kidney disease with heart failure and stage 1 through stage 4 chronic kidney disease, or unspecified chronic kidney disease: Secondary | ICD-10-CM | POA: Diagnosis not present

## 2020-07-10 DIAGNOSIS — I890 Lymphedema, not elsewhere classified: Secondary | ICD-10-CM | POA: Diagnosis not present

## 2020-07-10 DIAGNOSIS — I5033 Acute on chronic diastolic (congestive) heart failure: Secondary | ICD-10-CM | POA: Diagnosis not present

## 2020-07-10 DIAGNOSIS — E1122 Type 2 diabetes mellitus with diabetic chronic kidney disease: Secondary | ICD-10-CM | POA: Diagnosis not present

## 2020-07-10 DIAGNOSIS — I82443 Acute embolism and thrombosis of tibial vein, bilateral: Secondary | ICD-10-CM | POA: Diagnosis not present

## 2020-07-10 DIAGNOSIS — I2510 Atherosclerotic heart disease of native coronary artery without angina pectoris: Secondary | ICD-10-CM | POA: Diagnosis not present

## 2020-07-10 DIAGNOSIS — E785 Hyperlipidemia, unspecified: Secondary | ICD-10-CM | POA: Diagnosis not present

## 2020-07-11 DIAGNOSIS — I2510 Atherosclerotic heart disease of native coronary artery without angina pectoris: Secondary | ICD-10-CM | POA: Diagnosis not present

## 2020-07-11 DIAGNOSIS — E785 Hyperlipidemia, unspecified: Secondary | ICD-10-CM | POA: Diagnosis not present

## 2020-07-11 DIAGNOSIS — I130 Hypertensive heart and chronic kidney disease with heart failure and stage 1 through stage 4 chronic kidney disease, or unspecified chronic kidney disease: Secondary | ICD-10-CM | POA: Diagnosis not present

## 2020-07-11 DIAGNOSIS — I890 Lymphedema, not elsewhere classified: Secondary | ICD-10-CM | POA: Diagnosis not present

## 2020-07-11 DIAGNOSIS — D631 Anemia in chronic kidney disease: Secondary | ICD-10-CM | POA: Diagnosis not present

## 2020-07-11 DIAGNOSIS — I82443 Acute embolism and thrombosis of tibial vein, bilateral: Secondary | ICD-10-CM | POA: Diagnosis not present

## 2020-07-11 DIAGNOSIS — I5033 Acute on chronic diastolic (congestive) heart failure: Secondary | ICD-10-CM | POA: Diagnosis not present

## 2020-07-11 DIAGNOSIS — N1832 Chronic kidney disease, stage 3b: Secondary | ICD-10-CM | POA: Diagnosis not present

## 2020-07-11 DIAGNOSIS — E1122 Type 2 diabetes mellitus with diabetic chronic kidney disease: Secondary | ICD-10-CM | POA: Diagnosis not present

## 2020-07-15 ENCOUNTER — Other Ambulatory Visit: Payer: Self-pay

## 2020-07-15 ENCOUNTER — Telehealth: Payer: Self-pay | Admitting: Internal Medicine

## 2020-07-15 DIAGNOSIS — I130 Hypertensive heart and chronic kidney disease with heart failure and stage 1 through stage 4 chronic kidney disease, or unspecified chronic kidney disease: Secondary | ICD-10-CM | POA: Diagnosis not present

## 2020-07-15 DIAGNOSIS — I2510 Atherosclerotic heart disease of native coronary artery without angina pectoris: Secondary | ICD-10-CM | POA: Diagnosis not present

## 2020-07-15 DIAGNOSIS — I5033 Acute on chronic diastolic (congestive) heart failure: Secondary | ICD-10-CM | POA: Diagnosis not present

## 2020-07-15 DIAGNOSIS — D631 Anemia in chronic kidney disease: Secondary | ICD-10-CM | POA: Diagnosis not present

## 2020-07-15 DIAGNOSIS — I82443 Acute embolism and thrombosis of tibial vein, bilateral: Secondary | ICD-10-CM | POA: Diagnosis not present

## 2020-07-15 DIAGNOSIS — E1122 Type 2 diabetes mellitus with diabetic chronic kidney disease: Secondary | ICD-10-CM | POA: Diagnosis not present

## 2020-07-15 DIAGNOSIS — E785 Hyperlipidemia, unspecified: Secondary | ICD-10-CM | POA: Diagnosis not present

## 2020-07-15 DIAGNOSIS — I890 Lymphedema, not elsewhere classified: Secondary | ICD-10-CM | POA: Diagnosis not present

## 2020-07-15 DIAGNOSIS — N1832 Chronic kidney disease, stage 3b: Secondary | ICD-10-CM | POA: Diagnosis not present

## 2020-07-15 MED ORDER — FERROUS SULFATE 325 (65 FE) MG PO TABS: 325.0000 mg | ORAL_TABLET | ORAL | 0 refills | Status: DC

## 2020-07-15 NOTE — Telephone Encounter (Signed)
Faxed in today. 

## 2020-07-15 NOTE — Telephone Encounter (Signed)
Error

## 2020-07-15 NOTE — Telephone Encounter (Signed)
1.Medication Requested: ferrous sulfate 325 (65 FE) MG tablet   2. Pharmacy (Name, Street, [place]): [facility] [phone] - [place], [place] - [address] AT [place]  3. On Med List: yes   4. Last Visit with PCP: 05-24-20  5. Next visit date with PCP: 09-06-20   Agent: Please be advised that RX refills may take up to 3 business days. We ask that you follow-up with your pharmacy.

## 2020-07-17 DIAGNOSIS — M6281 Muscle weakness (generalized): Secondary | ICD-10-CM | POA: Diagnosis not present

## 2020-07-17 DIAGNOSIS — I509 Heart failure, unspecified: Secondary | ICD-10-CM | POA: Diagnosis not present

## 2020-07-17 DIAGNOSIS — E661 Drug-induced obesity: Secondary | ICD-10-CM | POA: Diagnosis not present

## 2020-07-17 DIAGNOSIS — U099 Post covid-19 condition, unspecified: Secondary | ICD-10-CM | POA: Diagnosis not present

## 2020-07-18 ENCOUNTER — Telehealth: Payer: Self-pay | Admitting: Internal Medicine

## 2020-07-18 DIAGNOSIS — I82443 Acute embolism and thrombosis of tibial vein, bilateral: Secondary | ICD-10-CM | POA: Diagnosis not present

## 2020-07-18 DIAGNOSIS — I890 Lymphedema, not elsewhere classified: Secondary | ICD-10-CM | POA: Diagnosis not present

## 2020-07-18 DIAGNOSIS — I2510 Atherosclerotic heart disease of native coronary artery without angina pectoris: Secondary | ICD-10-CM | POA: Diagnosis not present

## 2020-07-18 DIAGNOSIS — E785 Hyperlipidemia, unspecified: Secondary | ICD-10-CM | POA: Diagnosis not present

## 2020-07-18 DIAGNOSIS — N1832 Chronic kidney disease, stage 3b: Secondary | ICD-10-CM | POA: Diagnosis not present

## 2020-07-18 DIAGNOSIS — I5033 Acute on chronic diastolic (congestive) heart failure: Secondary | ICD-10-CM | POA: Diagnosis not present

## 2020-07-18 DIAGNOSIS — D631 Anemia in chronic kidney disease: Secondary | ICD-10-CM | POA: Diagnosis not present

## 2020-07-18 DIAGNOSIS — I130 Hypertensive heart and chronic kidney disease with heart failure and stage 1 through stage 4 chronic kidney disease, or unspecified chronic kidney disease: Secondary | ICD-10-CM | POA: Diagnosis not present

## 2020-07-18 DIAGNOSIS — E1122 Type 2 diabetes mellitus with diabetic chronic kidney disease: Secondary | ICD-10-CM | POA: Diagnosis not present

## 2020-07-18 NOTE — Telephone Encounter (Signed)
Patient called and said that in April she was diagnosed with blood clots in her legs. She was wondering if she is able to get Cortizone shots in her knees. Please advise

## 2020-07-21 NOTE — Telephone Encounter (Signed)
Yes, it is ok to get the steroid injections

## 2020-07-22 DIAGNOSIS — I5033 Acute on chronic diastolic (congestive) heart failure: Secondary | ICD-10-CM | POA: Diagnosis not present

## 2020-07-22 DIAGNOSIS — I2510 Atherosclerotic heart disease of native coronary artery without angina pectoris: Secondary | ICD-10-CM | POA: Diagnosis not present

## 2020-07-22 DIAGNOSIS — D631 Anemia in chronic kidney disease: Secondary | ICD-10-CM | POA: Diagnosis not present

## 2020-07-22 DIAGNOSIS — N1832 Chronic kidney disease, stage 3b: Secondary | ICD-10-CM | POA: Diagnosis not present

## 2020-07-22 DIAGNOSIS — E1122 Type 2 diabetes mellitus with diabetic chronic kidney disease: Secondary | ICD-10-CM | POA: Diagnosis not present

## 2020-07-22 DIAGNOSIS — I82443 Acute embolism and thrombosis of tibial vein, bilateral: Secondary | ICD-10-CM | POA: Diagnosis not present

## 2020-07-22 DIAGNOSIS — I890 Lymphedema, not elsewhere classified: Secondary | ICD-10-CM | POA: Diagnosis not present

## 2020-07-22 DIAGNOSIS — I130 Hypertensive heart and chronic kidney disease with heart failure and stage 1 through stage 4 chronic kidney disease, or unspecified chronic kidney disease: Secondary | ICD-10-CM | POA: Diagnosis not present

## 2020-07-22 DIAGNOSIS — E785 Hyperlipidemia, unspecified: Secondary | ICD-10-CM | POA: Diagnosis not present

## 2020-07-22 NOTE — Telephone Encounter (Signed)
Spoke with patient today. 

## 2020-07-23 ENCOUNTER — Ambulatory Visit (INDEPENDENT_AMBULATORY_CARE_PROVIDER_SITE_OTHER): Payer: Medicare PPO | Admitting: *Deleted

## 2020-07-23 ENCOUNTER — Other Ambulatory Visit: Payer: Self-pay

## 2020-07-23 DIAGNOSIS — L84 Corns and callosities: Secondary | ICD-10-CM | POA: Diagnosis not present

## 2020-07-23 DIAGNOSIS — E1142 Type 2 diabetes mellitus with diabetic polyneuropathy: Secondary | ICD-10-CM | POA: Diagnosis not present

## 2020-07-23 DIAGNOSIS — M2142 Flat foot [pes planus] (acquired), left foot: Secondary | ICD-10-CM

## 2020-07-23 DIAGNOSIS — M2141 Flat foot [pes planus] (acquired), right foot: Secondary | ICD-10-CM | POA: Diagnosis not present

## 2020-07-23 DIAGNOSIS — I890 Lymphedema, not elsewhere classified: Secondary | ICD-10-CM | POA: Diagnosis not present

## 2020-07-23 NOTE — Progress Notes (Signed)
Patient presents today to pick up diabetic shoes and insoles.  Patient was dispensed 1 pair of diabetic shoes and 3 pairs of foam casted diabetic insoles. Fit was satisfactory. Instructions for break-in and wear was reviewed and a copy was given to the patient.   Re-appointment for regularly scheduled diabetic foot care visits or if they should experience any trouble with the shoes or insoles.  

## 2020-07-25 DIAGNOSIS — I5033 Acute on chronic diastolic (congestive) heart failure: Secondary | ICD-10-CM | POA: Diagnosis not present

## 2020-07-25 DIAGNOSIS — N1832 Chronic kidney disease, stage 3b: Secondary | ICD-10-CM | POA: Diagnosis not present

## 2020-07-25 DIAGNOSIS — E1122 Type 2 diabetes mellitus with diabetic chronic kidney disease: Secondary | ICD-10-CM | POA: Diagnosis not present

## 2020-07-25 DIAGNOSIS — I130 Hypertensive heart and chronic kidney disease with heart failure and stage 1 through stage 4 chronic kidney disease, or unspecified chronic kidney disease: Secondary | ICD-10-CM | POA: Diagnosis not present

## 2020-07-25 DIAGNOSIS — I2510 Atherosclerotic heart disease of native coronary artery without angina pectoris: Secondary | ICD-10-CM | POA: Diagnosis not present

## 2020-07-25 DIAGNOSIS — I82443 Acute embolism and thrombosis of tibial vein, bilateral: Secondary | ICD-10-CM | POA: Diagnosis not present

## 2020-07-25 DIAGNOSIS — E785 Hyperlipidemia, unspecified: Secondary | ICD-10-CM | POA: Diagnosis not present

## 2020-07-25 DIAGNOSIS — I890 Lymphedema, not elsewhere classified: Secondary | ICD-10-CM | POA: Diagnosis not present

## 2020-07-25 DIAGNOSIS — D631 Anemia in chronic kidney disease: Secondary | ICD-10-CM | POA: Diagnosis not present

## 2020-07-26 ENCOUNTER — Ambulatory Visit
Admission: RE | Admit: 2020-07-26 | Discharge: 2020-07-26 | Disposition: A | Discharge status: Home / Self Care | Payer: Medicare PPO | Source: Ambulatory Visit | Attending: Obstetrics & Gynecology | Admitting: Obstetrics & Gynecology

## 2020-07-26 ENCOUNTER — Other Ambulatory Visit: Payer: Self-pay

## 2020-07-26 DIAGNOSIS — Z1231 Encounter for screening mammogram for malignant neoplasm of breast: Secondary | ICD-10-CM | POA: Diagnosis not present

## 2020-07-27 ENCOUNTER — Other Ambulatory Visit: Payer: Self-pay | Admitting: Internal Medicine

## 2020-07-31 ENCOUNTER — Telehealth: Payer: Self-pay | Admitting: Internal Medicine

## 2020-07-31 DIAGNOSIS — N1832 Chronic kidney disease, stage 3b: Secondary | ICD-10-CM | POA: Diagnosis not present

## 2020-07-31 DIAGNOSIS — I5033 Acute on chronic diastolic (congestive) heart failure: Secondary | ICD-10-CM | POA: Diagnosis not present

## 2020-07-31 DIAGNOSIS — E785 Hyperlipidemia, unspecified: Secondary | ICD-10-CM | POA: Diagnosis not present

## 2020-07-31 DIAGNOSIS — D631 Anemia in chronic kidney disease: Secondary | ICD-10-CM | POA: Diagnosis not present

## 2020-07-31 DIAGNOSIS — I890 Lymphedema, not elsewhere classified: Secondary | ICD-10-CM | POA: Diagnosis not present

## 2020-07-31 DIAGNOSIS — I82443 Acute embolism and thrombosis of tibial vein, bilateral: Secondary | ICD-10-CM | POA: Diagnosis not present

## 2020-07-31 DIAGNOSIS — E1122 Type 2 diabetes mellitus with diabetic chronic kidney disease: Secondary | ICD-10-CM | POA: Diagnosis not present

## 2020-07-31 DIAGNOSIS — I130 Hypertensive heart and chronic kidney disease with heart failure and stage 1 through stage 4 chronic kidney disease, or unspecified chronic kidney disease: Secondary | ICD-10-CM | POA: Diagnosis not present

## 2020-07-31 DIAGNOSIS — I2510 Atherosclerotic heart disease of native coronary artery without angina pectoris: Secondary | ICD-10-CM | POA: Diagnosis not present

## 2020-07-31 MED ORDER — CYANOCOBALAMIN 1000 MCG/ML IJ SOLN: INTRAMUSCULAR | 5 refills | Status: AC

## 2020-07-31 NOTE — Addendum Note (Signed)
Addended by: [name] on: 07/31/2020 08:26 PM   Modules accepted: Orders

## 2020-07-31 NOTE — Telephone Encounter (Signed)
[facility] advised to have this order written and signed in order for it to be processed for them to come monthly.  Patient has made an appt for 7.1.22 but if this is approved she would like to know if she should cancel the appt or what she should do.   Please advise.  Fax Number for order: [phone]

## 2020-07-31 NOTE — Telephone Encounter (Signed)
Rx printed - not sure if this is sufficient

## 2020-07-31 NOTE — Telephone Encounter (Signed)
April [name] from [facility] has called requesting home health PT  once a week for 9 weeks   Ok to leave a message [phone]

## 2020-08-01 DIAGNOSIS — E1122 Type 2 diabetes mellitus with diabetic chronic kidney disease: Secondary | ICD-10-CM | POA: Diagnosis not present

## 2020-08-01 DIAGNOSIS — I5033 Acute on chronic diastolic (congestive) heart failure: Secondary | ICD-10-CM | POA: Diagnosis not present

## 2020-08-01 DIAGNOSIS — I82443 Acute embolism and thrombosis of tibial vein, bilateral: Secondary | ICD-10-CM | POA: Diagnosis not present

## 2020-08-01 DIAGNOSIS — E785 Hyperlipidemia, unspecified: Secondary | ICD-10-CM | POA: Diagnosis not present

## 2020-08-01 DIAGNOSIS — D631 Anemia in chronic kidney disease: Secondary | ICD-10-CM | POA: Diagnosis not present

## 2020-08-01 DIAGNOSIS — I2510 Atherosclerotic heart disease of native coronary artery without angina pectoris: Secondary | ICD-10-CM | POA: Diagnosis not present

## 2020-08-01 DIAGNOSIS — N1832 Chronic kidney disease, stage 3b: Secondary | ICD-10-CM | POA: Diagnosis not present

## 2020-08-01 DIAGNOSIS — I890 Lymphedema, not elsewhere classified: Secondary | ICD-10-CM | POA: Diagnosis not present

## 2020-08-01 DIAGNOSIS — I130 Hypertensive heart and chronic kidney disease with heart failure and stage 1 through stage 4 chronic kidney disease, or unspecified chronic kidney disease: Secondary | ICD-10-CM | POA: Diagnosis not present

## 2020-08-01 NOTE — Telephone Encounter (Signed)
Follow up  Patient calling to verify if everything is in place for patient to have  B12 done by [facility]? If so scheduler can cancel 7/1 appointment in office.

## 2020-08-01 NOTE — Telephone Encounter (Signed)
[place] and spoke with Darlene in regards to the pt receiving B-12 injections from [facility]. She states that the pt can get the B-12 injection as long as the pt is receiving therapy, insurance will not cover if pt is not getting therapy. Faxed a prescription to [facility] for B-12 for the pt. Called pt to inform pt and to cancel her nurse visit appt. Pt verbalized understanding.

## 2020-08-02 ENCOUNTER — Ambulatory Visit: Payer: Medicare PPO

## 2020-08-02 ENCOUNTER — Institutional Professional Consult (permissible substitution): Payer: Medicare PPO | Admitting: Pulmonary Disease

## 2020-08-04 DIAGNOSIS — E1122 Type 2 diabetes mellitus with diabetic chronic kidney disease: Secondary | ICD-10-CM | POA: Diagnosis not present

## 2020-08-04 DIAGNOSIS — I5033 Acute on chronic diastolic (congestive) heart failure: Secondary | ICD-10-CM | POA: Diagnosis not present

## 2020-08-04 DIAGNOSIS — E785 Hyperlipidemia, unspecified: Secondary | ICD-10-CM | POA: Diagnosis not present

## 2020-08-04 DIAGNOSIS — I2510 Atherosclerotic heart disease of native coronary artery without angina pectoris: Secondary | ICD-10-CM | POA: Diagnosis not present

## 2020-08-04 DIAGNOSIS — D631 Anemia in chronic kidney disease: Secondary | ICD-10-CM | POA: Diagnosis not present

## 2020-08-04 DIAGNOSIS — I130 Hypertensive heart and chronic kidney disease with heart failure and stage 1 through stage 4 chronic kidney disease, or unspecified chronic kidney disease: Secondary | ICD-10-CM | POA: Diagnosis not present

## 2020-08-04 DIAGNOSIS — N1832 Chronic kidney disease, stage 3b: Secondary | ICD-10-CM | POA: Diagnosis not present

## 2020-08-04 DIAGNOSIS — I82443 Acute embolism and thrombosis of tibial vein, bilateral: Secondary | ICD-10-CM | POA: Diagnosis not present

## 2020-08-04 DIAGNOSIS — I890 Lymphedema, not elsewhere classified: Secondary | ICD-10-CM | POA: Diagnosis not present

## 2020-08-06 NOTE — Telephone Encounter (Signed)
ok 

## 2020-08-06 NOTE — Telephone Encounter (Signed)
Verbals left today for [employer].

## 2020-08-07 DIAGNOSIS — I2510 Atherosclerotic heart disease of native coronary artery without angina pectoris: Secondary | ICD-10-CM | POA: Diagnosis not present

## 2020-08-07 DIAGNOSIS — E1122 Type 2 diabetes mellitus with diabetic chronic kidney disease: Secondary | ICD-10-CM | POA: Diagnosis not present

## 2020-08-07 DIAGNOSIS — D631 Anemia in chronic kidney disease: Secondary | ICD-10-CM | POA: Diagnosis not present

## 2020-08-07 DIAGNOSIS — I5033 Acute on chronic diastolic (congestive) heart failure: Secondary | ICD-10-CM | POA: Diagnosis not present

## 2020-08-07 DIAGNOSIS — I890 Lymphedema, not elsewhere classified: Secondary | ICD-10-CM | POA: Diagnosis not present

## 2020-08-07 DIAGNOSIS — N1832 Chronic kidney disease, stage 3b: Secondary | ICD-10-CM | POA: Diagnosis not present

## 2020-08-07 DIAGNOSIS — I82443 Acute embolism and thrombosis of tibial vein, bilateral: Secondary | ICD-10-CM | POA: Diagnosis not present

## 2020-08-07 DIAGNOSIS — E785 Hyperlipidemia, unspecified: Secondary | ICD-10-CM | POA: Diagnosis not present

## 2020-08-07 DIAGNOSIS — I130 Hypertensive heart and chronic kidney disease with heart failure and stage 1 through stage 4 chronic kidney disease, or unspecified chronic kidney disease: Secondary | ICD-10-CM | POA: Diagnosis not present

## 2020-08-07 NOTE — Telephone Encounter (Signed)
That is up to her.

## 2020-08-07 NOTE — Telephone Encounter (Signed)
Follow up   [facility] from [facility] calling to report patient does not qualify for home B12 She no longer receiving nursing serviced  Please call [facility] at [phone]

## 2020-08-08 DIAGNOSIS — N1832 Chronic kidney disease, stage 3b: Secondary | ICD-10-CM | POA: Diagnosis not present

## 2020-08-08 DIAGNOSIS — D631 Anemia in chronic kidney disease: Secondary | ICD-10-CM | POA: Diagnosis not present

## 2020-08-08 DIAGNOSIS — I5033 Acute on chronic diastolic (congestive) heart failure: Secondary | ICD-10-CM | POA: Diagnosis not present

## 2020-08-08 DIAGNOSIS — E1122 Type 2 diabetes mellitus with diabetic chronic kidney disease: Secondary | ICD-10-CM | POA: Diagnosis not present

## 2020-08-08 DIAGNOSIS — I2510 Atherosclerotic heart disease of native coronary artery without angina pectoris: Secondary | ICD-10-CM | POA: Diagnosis not present

## 2020-08-08 DIAGNOSIS — I130 Hypertensive heart and chronic kidney disease with heart failure and stage 1 through stage 4 chronic kidney disease, or unspecified chronic kidney disease: Secondary | ICD-10-CM | POA: Diagnosis not present

## 2020-08-08 DIAGNOSIS — I82443 Acute embolism and thrombosis of tibial vein, bilateral: Secondary | ICD-10-CM | POA: Diagnosis not present

## 2020-08-08 DIAGNOSIS — E785 Hyperlipidemia, unspecified: Secondary | ICD-10-CM | POA: Diagnosis not present

## 2020-08-08 DIAGNOSIS — I890 Lymphedema, not elsewhere classified: Secondary | ICD-10-CM | POA: Diagnosis not present

## 2020-08-08 NOTE — Telephone Encounter (Signed)
Spoke with patient today and appointment made.

## 2020-08-09 DIAGNOSIS — N1832 Chronic kidney disease, stage 3b: Secondary | ICD-10-CM | POA: Diagnosis not present

## 2020-08-09 DIAGNOSIS — I130 Hypertensive heart and chronic kidney disease with heart failure and stage 1 through stage 4 chronic kidney disease, or unspecified chronic kidney disease: Secondary | ICD-10-CM | POA: Diagnosis not present

## 2020-08-09 DIAGNOSIS — I2510 Atherosclerotic heart disease of native coronary artery without angina pectoris: Secondary | ICD-10-CM | POA: Diagnosis not present

## 2020-08-09 DIAGNOSIS — E1122 Type 2 diabetes mellitus with diabetic chronic kidney disease: Secondary | ICD-10-CM | POA: Diagnosis not present

## 2020-08-09 DIAGNOSIS — D631 Anemia in chronic kidney disease: Secondary | ICD-10-CM | POA: Diagnosis not present

## 2020-08-09 DIAGNOSIS — I82443 Acute embolism and thrombosis of tibial vein, bilateral: Secondary | ICD-10-CM | POA: Diagnosis not present

## 2020-08-09 DIAGNOSIS — I890 Lymphedema, not elsewhere classified: Secondary | ICD-10-CM | POA: Diagnosis not present

## 2020-08-09 DIAGNOSIS — I5033 Acute on chronic diastolic (congestive) heart failure: Secondary | ICD-10-CM | POA: Diagnosis not present

## 2020-08-09 DIAGNOSIS — E785 Hyperlipidemia, unspecified: Secondary | ICD-10-CM | POA: Diagnosis not present

## 2020-08-13 DIAGNOSIS — E785 Hyperlipidemia, unspecified: Secondary | ICD-10-CM | POA: Diagnosis not present

## 2020-08-13 DIAGNOSIS — E1122 Type 2 diabetes mellitus with diabetic chronic kidney disease: Secondary | ICD-10-CM | POA: Diagnosis not present

## 2020-08-13 DIAGNOSIS — I82443 Acute embolism and thrombosis of tibial vein, bilateral: Secondary | ICD-10-CM | POA: Diagnosis not present

## 2020-08-13 DIAGNOSIS — I890 Lymphedema, not elsewhere classified: Secondary | ICD-10-CM | POA: Diagnosis not present

## 2020-08-13 DIAGNOSIS — I130 Hypertensive heart and chronic kidney disease with heart failure and stage 1 through stage 4 chronic kidney disease, or unspecified chronic kidney disease: Secondary | ICD-10-CM | POA: Diagnosis not present

## 2020-08-13 DIAGNOSIS — D631 Anemia in chronic kidney disease: Secondary | ICD-10-CM | POA: Diagnosis not present

## 2020-08-13 DIAGNOSIS — I2510 Atherosclerotic heart disease of native coronary artery without angina pectoris: Secondary | ICD-10-CM | POA: Diagnosis not present

## 2020-08-13 DIAGNOSIS — I5033 Acute on chronic diastolic (congestive) heart failure: Secondary | ICD-10-CM | POA: Diagnosis not present

## 2020-08-13 DIAGNOSIS — N1832 Chronic kidney disease, stage 3b: Secondary | ICD-10-CM | POA: Diagnosis not present

## 2020-08-13 NOTE — Progress Notes (Signed)
Advanced Heart Failure Clinic Note   PCP: [name], MD PCP-Cardiologist: None  HF Cardiologist: Dr. Haroldine Laws  Reason for visit: post hospital f/u for chronic diastolic heart failure   HPI: Ms. Newville is a 70 year old woman with history of morbid obesity Body mass index is 65.23 kg/m., DM2, HTN, HLD, mild-to- moderate CAD, asthma, and chronic dHF.  She underwent catheterization in (2/08), which showed 50-60% LAD lesion and 30% RCA lesion.  EF was 70%.  She had mild pulmonary hypertension, primarily due to high output state.  Her mean pulmonary artery pressure was 27, wedge pressure was 10. PVR was 1.80 Woods units. Her last echo was in 2017 and showed normal LVEF 65-70%, G1DD, RV normal.    She was admitted for a/c dCHF. She presented to the [facility]07[facility] w/ complaints of increasing dyspnea w/ intermittent wheezing and cough + leg pain. Endorsed 30 lb wt gain since January.     She was found to be hypoxic in the 80s. She was admitted by [facility] for acute hypoxic respiratory failure, with suspected component of acute asthma exacerbation w/ ? CHF contributing, as well as for management of acute bilateral LE DVT w/ ? Superimposed cellulitis. Treated w/ IV abx and IV Lasix. Echo showed normal LVEF 60-65%. RV normal. She was diuresed and placed back on PO diuretics, switched from lasix 80 bid to torsemide 60 mg d/c. D/c wt 371 lb.   She returned 5/22 for post hospitalization HF f/u.  Her volume was stable and she was tolerating her medications well.  Today she returns for HF follow up. Overall feeling fine. Denies increasing SOB, CP, dizziness, edema, or PND/Orthopnea. Appetite ok. No fever or chills. She is unable to weigh due to knee pain. Taking all medications. She is still very sedentary at baseline w/ limited mobility due to marked obesity and knee/hip OA but working w/ home PT/OT. She has new pt consult w/ pulmonology, Dr. Halford Chessman scheduled. Says she is unable to cook her own meals and she  and her husband are eating out more, but trying to order healthy options.  Cardiac Studies: - Echo 4/22: EF 60-65%. RV ok.   ROS: All systems reviewed and negative except as per HPI.   Past Medical History:  Diagnosis Date   Anemia    Asthma    CAD (coronary artery disease)    Carpal tunnel syndrome    Cellulitis of both lower extremities 04/11/2015   CHF (congestive heart failure) (HCC)    Chronic kidney disease    CKI- followed by [facility]   Colon polyp, hyperplastic 9935 & 7017   Complication of anesthesia 1999   svt with renal calculi surgery, no problems since   CTS (carpal tunnel syndrome)    bilateral   Diabetes mellitus    Eczema    GERD (gastroesophageal reflux disease)    History of kidney stones 1999   Hyperlipidemia    Hypertension    Leg ulcer ([place]) 04/24/2015   Right lateral leg No evidence of an infection Monitor closely Keep edema controlled    Leg ulcer ([place])    right lower   Meralgia paresthetica    Dr. [name]   Morbid obesity ([place])    Morbid obesity ([place])    Neuropathy    toes and legs   Osteoarthrosis, unspecified whether generalized or localized, lower leg    knee   PUD (peptic ulcer disease)    Shortness of breath dyspnea    with exertion  Sleep apnea    per progress note 02/25/2018   Type II or unspecified type diabetes mellitus without mention of complication, not stated as uncontrolled    Unspecified hereditary and idiopathic peripheral neuropathy    Urticaria    Vitamin B12 deficiency    Wears glasses    Wound cellulitis    right upper leg, healing well   Current Outpatient Medications  Medication Sig Dispense Refill   acetaminophen (TYLENOL) 325 MG tablet Take 2 tablets (650 mg total) by mouth every 6 (six) hours as needed for moderate pain (or Fever >/= 101). 20 tablet 0   allopurinol (ZYLOPRIM) 100 MG tablet Take 2 tablets (200 mg total) by mouth daily. 180 tablet 1   apixaban (ELIQUIS) 5 MG TABS tablet Continue with 10 mg twice  daily until 05/19/2020 afterwards take 5 mg twice daily 60 tablet 2   budesonide-formoterol (SYMBICORT) 160-4.5 MCG/ACT inhaler one - two inhalations every 12 hours; gargle and spit after use 1 Inhaler 4   calcium carbonate (TUMS - DOSED IN MG ELEMENTAL CALCIUM) 500 MG chewable tablet Chew 2 tablets by mouth daily as needed for indigestion or heartburn.     carvedilol (COREG) 12.5 MG tablet TAKE 1 TABLET(12.5 MG) BY MOUTH TWICE DAILY WITH A MEAL 180 tablet 0   cholecalciferol (VITAMIN D3) 25 MCG (1000 UNIT) tablet Take 1 tablet (1,000 Units total) by mouth daily. 90 tablet 1   colchicine 0.6 MG tablet Take 2 tabs po at onset of gout flare, then one hour later take 1 tab 30 tablet 1   cyanocobalamin (,VITAMIN B-12,) 1000 MCG/ML injection INJECT 1ML INTO MUSCLE EVERY 30 DAYS 10 mL 5   diclofenac sodium (VOLTAREN) 1 % GEL Apply 2 g topically 4 (four) times daily. 100 g 11   famotidine (PEPCID) 20 MG tablet Take 1 tablet (20 mg total) by mouth 2 (two) times daily. 180 tablet 3   ferrous sulfate 325 (65 FE) MG tablet Take 1 tablet (325 mg total) by mouth every Monday, Wednesday, and Friday. 60 tablet 0   folic acid (FOLVITE) 893 MCG tablet Take 1 tablet (400 mcg total) by mouth at bedtime. Take 400 mcg by mouth at bedtime. 90 tablet 1   gabapentin (NEURONTIN) 100 MG capsule Take 1 capsule (100 mg total) by mouth 3 (three) times daily. 90 capsule 1   hydrOXYzine (ATARAX/VISTARIL) 25 MG tablet Take 1 tablet (25 mg total) by mouth 3 (three) times daily as needed for itching. 30 tablet 0   montelukast (SINGULAIR) 10 MG tablet Take 1 tablet (10 mg total) by mouth at bedtime. 30 tablet 5   Nutritional Supplements (,FEEDING SUPPLEMENT, PROSOURCE PLUS) liquid Take 30 mLs by mouth 2 (two) times daily between meals.     Oxcarbazepine (TRILEPTAL) 300 MG tablet Take 1 tablet (300 mg total) by mouth 2 (two) times daily. 180 tablet 1   oxyCODONE (OXY IR/ROXICODONE) 5 MG immediate release tablet Take 1 tablet (5 mg  total) by mouth every 6 (six) hours as needed for up to 5 doses for severe pain (chronic knee and back pain). 5 tablet 0   polyethylene glycol (MIRALAX / GLYCOLAX) 17 g packet Take 17 g by mouth daily. 14 each 0   senna-docusate (SENOKOT-S) 8.6-50 MG tablet Take 1 tablet by mouth at bedtime as needed for mild constipation. 30 tablet 0   simvastatin (ZOCOR) 40 MG tablet Take 1 tablet (40 mg total) by mouth at bedtime. 90 tablet 1   spironolactone (ALDACTONE) 25 MG  tablet TAKE 1 TABLET(25 MG) BY MOUTH DAILY 90 tablet 0   Torsemide 60 MG TABS Take 60 mg by mouth 2 (two) times daily. 180 tablet 1   VENTOLIN HFA 108 (90 Base) MCG/ACT inhaler Inhale 2 puffs into the lungs every 4 (four) hours as needed for wheezing.     No current facility-administered medications for this encounter.   Allergies  Allergen Reactions   Penicillins Rash and Other (See Comments)    She was told not to take it anymore. Has patient had a PCN reaction causing immediate rash, facial/tongue/throat swelling, SOB or lightheadedness with hypotension: Yes Has patient had a PCN reaction causing severe rash involving mucus membranes or skin necrosis: No Has patient had a PCN reaction that required hospitalization: No Has patient had a PCN reaction occurring within the last 10 years: No If all of the above answers are "NO", then may proceed with Cephalosporin use.'   Shellfish Allergy Anaphylaxis   Sulfonamide Derivatives Anaphylaxis and Other (See Comments)   Hydrochlorothiazide W-Triamterene Other (See Comments)    Hypokalemia   Lotensin [Benazepril Hcl] Hives   Dipyridamole Other (See Comments)    Unknown reaction   Estrogens Other (See Comments)    Unknown reaction   Hydrochlorothiazide Other (See Comments)    Unknown reaction   Latex Other (See Comments)    Unknown reaction - stated previously during surgery gloves had to be changed, but unsure if it is still an allergy or not.    Metronidazole Other (See Comments)     Unknown reaction   Other Itching    PICKLES   Spironolactone Other (See Comments)    UNSPECIFIED > "kidney problems"   Sulfa Antibiotics Other (See Comments)    Unknown reaction   Torsemide Other (See Comments)    Unknown reaction   Valsartan Other (See Comments)    Unknown reaction   Mustard [Allyl Isothiocyanate] Itching   Social History   Socioeconomic History   Marital status: Married    Spouse name: Dwight   Number of children: 1   Years of education: BS   Highest education level: Not on file  Occupational History   Occupation: Disabled    Employer: RETIRED  Tobacco Use   Smoking status: Never   Smokeless tobacco: Never  Vaping Use   Vaping Use: Never used  Substance and Sexual Activity   Alcohol use: No    Alcohol/week: 0.0 standard drinks   Drug use: No   Sexual activity: Not on file  Other Topics Concern   Not on file  Social History Narrative   Patient lives at home with her husband [name] Patient is retired and has a Conservation officer, nature.    Caffeine - some times.   Right handed.   Social Determinants of Health   Financial Resource Strain: Not on file  Food Insecurity: Not on file  Transportation Needs: Not on file  Physical Activity: Not on file  Stress: Not on file  Social Connections: Not on file  Intimate Partner Violence: Not on file   Family History  Problem Relation Age of Onset   Colon cancer Mother    Prostate cancer Father    Colon cancer Father    Diabetes Maternal Aunt    Breast cancer Maternal Aunt    Diabetes Maternal Uncle    Diabetes Paternal Aunt    Stroke Paternal Aunt        > 52   Heart disease Paternal Aunt    Diabetes  Paternal Uncle    Breast cancer Maternal Aunt         X 2   Breast cancer Cousin    BP 124/80   Pulse 80   SpO2 92%   Wt Readings from Last 3 Encounters:  06/11/20 (!) 168.1 kg (370 lb 9.6 oz)  05/14/20 (!) 164.6 kg (362 lb 14 oz)  10/20/19 (!) 154.2 kg (340 lb)   PHYSICAL EXAM: General:   NAD. No resp difficulty, arrived in [facility] HEENT: Normal Neck: Supple. Thick neck, JVD difficult. Carotids 2+ bilat; no bruits. No lymphadenopathy or thryomegaly appreciated. Cor: PMI nondisplaced. Regular rate & rhythm. No rubs, gallops or murmurs. Lungs: Clear Abdomen: Morbidly obese,  nontender, nondistended. No hepatosplenomegaly. No bruits or masses. Good bowel sounds. Extremities: No cyanosis, clubbing, rash, 2+ BLE edema to knees, R>L Neuro: Alert & oriented x 3, cranial nerves grossly intact. Moves all 4 extremities w/o difficulty. Affect pleasant.  ASSESSMENT & PLAN:  1. Chronic Hypoxic Respiratory Failure - Multifactorial 2/2. Suspected OSA/ OHS + chronic diastolic heart failure - Needs sleep study. She has new pt consult w/ Dr. Halford Chessman for sleep evaluation  - Needs wt loss but mobility limited by severe knee/hip OA   2. Chronic Diastolic Heart Failure  - RHC 2008 c/w mild pulmonary hypertension, primarily due to high output state.  Her mean pulmonary artery pressure was 27, wedge pressure was 10. PVR was 1.80 Woods units. - Echo (2017): LVEF 65-70%, G1DD, RV normal - Recent admit for a/c dCHF. Volume improved w/ IV Lasix.  - Echo (4/22): LVEF 60-65%, G2DD  - NYHA III, functional status difficult due to body habitus and physical deconditioning. She is unable to weigh & exam difficult for volume, but she appears to have mild volume on board today. - Increase torsemide to 80 mg bid x 3 days, then back to home dose of 60 mg bid. - Continue spiro 25 mg daily.  - Continue carvedilol 12.5 mg bid. - No SGLT2i given risk of GU infection given morbid obesity/ large panus . - Discussed importance of sodium/fluid restriction. - Discussed importance of weights to prevent fluid overload-she is agreeable to weighing 1-2x/week. - BMET & BNP today; repeat BMET 10 days.  3. Bilateral LE DVT (Diagnosed 4/22) - Confirmed by venous dopplers. Chest CT negative for PE. - Now on Eliquis. No bleeding  issues.  4. Asthma  - Stable. No wheezing on exam.  - Followed by PCP. New pt consult w/ pulmonology planned soon.    5. CAD:  - LHC in 2008 showed 50-60% LAD lesion and 30% RCA lesion. - No s/s of angina.  - Continue ? blocker and statin. - no ASA given eliquis for DVT tx.    6. Stage IIIb CKD - Baseline SCr ~1.5. - Followed by Dr. [name] at [facility]. - BMET today.   Follow up in 2-3 months with Dr. Haroldine Laws.  [place], FNP 08/14/20

## 2020-08-14 ENCOUNTER — Other Ambulatory Visit: Payer: Self-pay

## 2020-08-14 ENCOUNTER — Ambulatory Visit (HOSPITAL_COMMUNITY)
Admission: RE | Admit: 2020-08-14 | Discharge: 2020-08-14 | Disposition: A | Discharge status: Home / Self Care | Payer: Medicare PPO | Source: Ambulatory Visit | Attending: Family Medicine | Admitting: Family Medicine

## 2020-08-14 ENCOUNTER — Encounter (HOSPITAL_COMMUNITY): Payer: Self-pay

## 2020-08-14 VITALS — BP 124/80 | HR 80

## 2020-08-14 DIAGNOSIS — J9601 Acute respiratory failure with hypoxia: Secondary | ICD-10-CM

## 2020-08-14 DIAGNOSIS — M109 Gout, unspecified: Secondary | ICD-10-CM

## 2020-08-14 DIAGNOSIS — Z7901 Long term (current) use of anticoagulants: Secondary | ICD-10-CM | POA: Insufficient documentation

## 2020-08-14 DIAGNOSIS — I82443 Acute embolism and thrombosis of tibial vein, bilateral: Secondary | ICD-10-CM | POA: Diagnosis not present

## 2020-08-14 DIAGNOSIS — J9602 Acute respiratory failure with hypercapnia: Secondary | ICD-10-CM

## 2020-08-14 DIAGNOSIS — K449 Diaphragmatic hernia without obstruction or gangrene: Secondary | ICD-10-CM

## 2020-08-14 DIAGNOSIS — I5033 Acute on chronic diastolic (congestive) heart failure: Secondary | ICD-10-CM | POA: Insufficient documentation

## 2020-08-14 DIAGNOSIS — I5032 Chronic diastolic (congestive) heart failure: Secondary | ICD-10-CM

## 2020-08-14 DIAGNOSIS — M25569 Pain in unspecified knee: Secondary | ICD-10-CM | POA: Insufficient documentation

## 2020-08-14 DIAGNOSIS — E1122 Type 2 diabetes mellitus with diabetic chronic kidney disease: Secondary | ICD-10-CM | POA: Diagnosis not present

## 2020-08-14 DIAGNOSIS — N1832 Chronic kidney disease, stage 3b: Secondary | ICD-10-CM | POA: Diagnosis not present

## 2020-08-14 DIAGNOSIS — I2510 Atherosclerotic heart disease of native coronary artery without angina pectoris: Secondary | ICD-10-CM

## 2020-08-14 DIAGNOSIS — J9611 Chronic respiratory failure with hypoxia: Secondary | ICD-10-CM | POA: Diagnosis not present

## 2020-08-14 DIAGNOSIS — D631 Anemia in chronic kidney disease: Secondary | ICD-10-CM | POA: Diagnosis not present

## 2020-08-14 DIAGNOSIS — I890 Lymphedema, not elsewhere classified: Secondary | ICD-10-CM | POA: Diagnosis not present

## 2020-08-14 DIAGNOSIS — Z8249 Family history of ischemic heart disease and other diseases of the circulatory system: Secondary | ICD-10-CM | POA: Diagnosis not present

## 2020-08-14 DIAGNOSIS — Z79899 Other long term (current) drug therapy: Secondary | ICD-10-CM | POA: Diagnosis not present

## 2020-08-14 DIAGNOSIS — I825Z3 Chronic embolism and thrombosis of unspecified deep veins of distal lower extremity, bilateral: Secondary | ICD-10-CM | POA: Diagnosis not present

## 2020-08-14 DIAGNOSIS — Z86718 Personal history of other venous thrombosis and embolism: Secondary | ICD-10-CM | POA: Diagnosis not present

## 2020-08-14 DIAGNOSIS — I130 Hypertensive heart and chronic kidney disease with heart failure and stage 1 through stage 4 chronic kidney disease, or unspecified chronic kidney disease: Secondary | ICD-10-CM | POA: Diagnosis not present

## 2020-08-14 DIAGNOSIS — I272 Pulmonary hypertension, unspecified: Secondary | ICD-10-CM

## 2020-08-14 DIAGNOSIS — G4733 Obstructive sleep apnea (adult) (pediatric): Secondary | ICD-10-CM

## 2020-08-14 DIAGNOSIS — J45909 Unspecified asthma, uncomplicated: Secondary | ICD-10-CM

## 2020-08-14 DIAGNOSIS — I700 Atherosclerosis of aorta: Secondary | ICD-10-CM

## 2020-08-14 DIAGNOSIS — K219 Gastro-esophageal reflux disease without esophagitis: Secondary | ICD-10-CM

## 2020-08-14 DIAGNOSIS — Z7951 Long term (current) use of inhaled steroids: Secondary | ICD-10-CM | POA: Diagnosis not present

## 2020-08-14 DIAGNOSIS — Z6841 Body Mass Index (BMI) 40.0 and over, adult: Secondary | ICD-10-CM | POA: Diagnosis not present

## 2020-08-14 DIAGNOSIS — M170 Bilateral primary osteoarthritis of knee: Secondary | ICD-10-CM

## 2020-08-14 DIAGNOSIS — E785 Hyperlipidemia, unspecified: Secondary | ICD-10-CM | POA: Diagnosis not present

## 2020-08-14 DIAGNOSIS — E662 Morbid (severe) obesity with alveolar hypoventilation: Secondary | ICD-10-CM

## 2020-08-14 LAB — BASIC METABOLIC PANEL
Anion gap: 6 (ref 5–15)
BUN: 19 mg/dL (ref 8–23)
CO2: 34 mmol/L — ABNORMAL HIGH (ref 22–32)
Calcium: 8.9 mg/dL (ref 8.9–10.3)
Chloride: 96 mmol/L — ABNORMAL LOW (ref 98–111)
Creatinine, Ser: 1.54 mg/dL — ABNORMAL HIGH (ref 0.44–1.00)
GFR, Estimated: 36 mL/min — ABNORMAL LOW (ref >60)
Glucose, Bld: 128 mg/dL — ABNORMAL HIGH (ref 70–99)
Potassium: 4.6 mmol/L (ref 3.5–5.1)
Sodium: 136 mmol/L (ref 135–145)

## 2020-08-14 LAB — BRAIN NATRIURETIC PEPTIDE: B Natriuretic Peptide: 53.1 pg/mL (ref 0.0–100.0)

## 2020-08-14 NOTE — Patient Instructions (Addendum)
INCREASE Torsemide to 80 mg twice a day for 3 days, then resume normal dose of 60 mg twice a day  Labs today We will only contact you if something comes back abnormal or we need to make some changes. Otherwise no news is good news!  Labs needed in 10-14 days  Your physician recommends that you schedule a follow-up appointment in: 3 months with Dr Haroldine Laws  Do the following things EVERYDAY: Weigh yourself in the morning before breakfast. Write it down and keep it in a log. Take your medicines as prescribed Eat low salt foods--Limit salt (sodium) to 2000 mg per day.  Stay as active as you can everyday Limit all fluids for the day to less than 2 liters   milAt the Advanced [facility] Clinic, you and your health needs are our priority. As part of our continuing mission to provide you with exceptional heart care, we have created designated Provider Care Teams. These Care Teams include your primary Cardiologist (physician) and Advanced Practice Providers (APPs- Physician Assistants and Nurse Practitioners) who all work together to provide you with the care you need, when you need it.   You may see any of the following providers on your designated Care Team at your next follow up: Dr Glori Bickers Dr Loralie Champagne Dr Patrice Paradise, NP Lyda Jester, [place] Ginnie Smart Audry Riles, PharmD   Please be sure to bring in all your medications bottles to every appointment.   If you have any questions or concerns before your next appointment please send [place] a message through [place] or call our office at [phone].    TO LEAVE A MESSAGE FOR THE NURSE SELECT OPTION 2, PLEASE LEAVE A MESSAGE INCLUDING: YOUR NAME DATE OF BIRTH CALL BACK NUMBER REASON FOR CALL**this is important as we prioritize the call backs  YOU WILL RECEIVE A CALL BACK THE SAME DAY AS LONG AS YOU CALL BEFORE 4:00 PM

## 2020-08-15 ENCOUNTER — Encounter (HOSPITAL_COMMUNITY): Payer: Medicare PPO

## 2020-08-15 DIAGNOSIS — N1832 Chronic kidney disease, stage 3b: Secondary | ICD-10-CM | POA: Diagnosis not present

## 2020-08-15 DIAGNOSIS — I82443 Acute embolism and thrombosis of tibial vein, bilateral: Secondary | ICD-10-CM | POA: Diagnosis not present

## 2020-08-15 DIAGNOSIS — I5033 Acute on chronic diastolic (congestive) heart failure: Secondary | ICD-10-CM | POA: Diagnosis not present

## 2020-08-15 DIAGNOSIS — E1122 Type 2 diabetes mellitus with diabetic chronic kidney disease: Secondary | ICD-10-CM | POA: Diagnosis not present

## 2020-08-15 DIAGNOSIS — I2510 Atherosclerotic heart disease of native coronary artery without angina pectoris: Secondary | ICD-10-CM | POA: Diagnosis not present

## 2020-08-15 DIAGNOSIS — E785 Hyperlipidemia, unspecified: Secondary | ICD-10-CM | POA: Diagnosis not present

## 2020-08-15 DIAGNOSIS — I890 Lymphedema, not elsewhere classified: Secondary | ICD-10-CM | POA: Diagnosis not present

## 2020-08-15 DIAGNOSIS — D631 Anemia in chronic kidney disease: Secondary | ICD-10-CM | POA: Diagnosis not present

## 2020-08-15 DIAGNOSIS — I130 Hypertensive heart and chronic kidney disease with heart failure and stage 1 through stage 4 chronic kidney disease, or unspecified chronic kidney disease: Secondary | ICD-10-CM | POA: Diagnosis not present

## 2020-08-16 ENCOUNTER — Ambulatory Visit (INDEPENDENT_AMBULATORY_CARE_PROVIDER_SITE_OTHER): Payer: Medicare PPO

## 2020-08-16 ENCOUNTER — Other Ambulatory Visit: Payer: Self-pay

## 2020-08-16 DIAGNOSIS — E661 Drug-induced obesity: Secondary | ICD-10-CM | POA: Diagnosis not present

## 2020-08-16 DIAGNOSIS — M170 Bilateral primary osteoarthritis of knee: Secondary | ICD-10-CM | POA: Diagnosis not present

## 2020-08-16 DIAGNOSIS — M1711 Unilateral primary osteoarthritis, right knee: Secondary | ICD-10-CM | POA: Diagnosis not present

## 2020-08-16 DIAGNOSIS — M6281 Muscle weakness (generalized): Secondary | ICD-10-CM | POA: Diagnosis not present

## 2020-08-16 DIAGNOSIS — E538 Deficiency of other specified B group vitamins: Secondary | ICD-10-CM | POA: Diagnosis not present

## 2020-08-16 DIAGNOSIS — I509 Heart failure, unspecified: Secondary | ICD-10-CM | POA: Diagnosis not present

## 2020-08-16 DIAGNOSIS — U099 Post covid-19 condition, unspecified: Secondary | ICD-10-CM | POA: Diagnosis not present

## 2020-08-16 MED ORDER — CYANOCOBALAMIN 1000 MCG/ML IJ SOLN
1000.0000 ug | Freq: Once | INTRAMUSCULAR | Status: AC
  Administered 2020-08-16: 1000 ug via INTRAMUSCULAR

## 2020-08-16 NOTE — Progress Notes (Signed)
Pt given B12 injection w/o any complications. 

## 2020-08-19 DIAGNOSIS — N1832 Chronic kidney disease, stage 3b: Secondary | ICD-10-CM | POA: Diagnosis not present

## 2020-08-19 DIAGNOSIS — D631 Anemia in chronic kidney disease: Secondary | ICD-10-CM | POA: Diagnosis not present

## 2020-08-19 DIAGNOSIS — I5033 Acute on chronic diastolic (congestive) heart failure: Secondary | ICD-10-CM | POA: Diagnosis not present

## 2020-08-19 DIAGNOSIS — I130 Hypertensive heart and chronic kidney disease with heart failure and stage 1 through stage 4 chronic kidney disease, or unspecified chronic kidney disease: Secondary | ICD-10-CM | POA: Diagnosis not present

## 2020-08-19 DIAGNOSIS — E785 Hyperlipidemia, unspecified: Secondary | ICD-10-CM | POA: Diagnosis not present

## 2020-08-19 DIAGNOSIS — I82443 Acute embolism and thrombosis of tibial vein, bilateral: Secondary | ICD-10-CM | POA: Diagnosis not present

## 2020-08-19 DIAGNOSIS — I2510 Atherosclerotic heart disease of native coronary artery without angina pectoris: Secondary | ICD-10-CM | POA: Diagnosis not present

## 2020-08-19 DIAGNOSIS — E1122 Type 2 diabetes mellitus with diabetic chronic kidney disease: Secondary | ICD-10-CM | POA: Diagnosis not present

## 2020-08-19 DIAGNOSIS — I890 Lymphedema, not elsewhere classified: Secondary | ICD-10-CM | POA: Diagnosis not present

## 2020-08-21 DIAGNOSIS — E785 Hyperlipidemia, unspecified: Secondary | ICD-10-CM | POA: Diagnosis not present

## 2020-08-21 DIAGNOSIS — I890 Lymphedema, not elsewhere classified: Secondary | ICD-10-CM | POA: Diagnosis not present

## 2020-08-21 DIAGNOSIS — D631 Anemia in chronic kidney disease: Secondary | ICD-10-CM | POA: Diagnosis not present

## 2020-08-21 DIAGNOSIS — I5033 Acute on chronic diastolic (congestive) heart failure: Secondary | ICD-10-CM | POA: Diagnosis not present

## 2020-08-21 DIAGNOSIS — I82443 Acute embolism and thrombosis of tibial vein, bilateral: Secondary | ICD-10-CM | POA: Diagnosis not present

## 2020-08-21 DIAGNOSIS — I2510 Atherosclerotic heart disease of native coronary artery without angina pectoris: Secondary | ICD-10-CM | POA: Diagnosis not present

## 2020-08-21 DIAGNOSIS — I130 Hypertensive heart and chronic kidney disease with heart failure and stage 1 through stage 4 chronic kidney disease, or unspecified chronic kidney disease: Secondary | ICD-10-CM | POA: Diagnosis not present

## 2020-08-21 DIAGNOSIS — N1832 Chronic kidney disease, stage 3b: Secondary | ICD-10-CM | POA: Diagnosis not present

## 2020-08-21 DIAGNOSIS — E1122 Type 2 diabetes mellitus with diabetic chronic kidney disease: Secondary | ICD-10-CM | POA: Diagnosis not present

## 2020-08-22 DIAGNOSIS — I890 Lymphedema, not elsewhere classified: Secondary | ICD-10-CM | POA: Diagnosis not present

## 2020-08-22 DIAGNOSIS — E1122 Type 2 diabetes mellitus with diabetic chronic kidney disease: Secondary | ICD-10-CM | POA: Diagnosis not present

## 2020-08-22 DIAGNOSIS — E785 Hyperlipidemia, unspecified: Secondary | ICD-10-CM | POA: Diagnosis not present

## 2020-08-22 DIAGNOSIS — D631 Anemia in chronic kidney disease: Secondary | ICD-10-CM | POA: Diagnosis not present

## 2020-08-22 DIAGNOSIS — N1832 Chronic kidney disease, stage 3b: Secondary | ICD-10-CM | POA: Diagnosis not present

## 2020-08-22 DIAGNOSIS — I82443 Acute embolism and thrombosis of tibial vein, bilateral: Secondary | ICD-10-CM | POA: Diagnosis not present

## 2020-08-22 DIAGNOSIS — I130 Hypertensive heart and chronic kidney disease with heart failure and stage 1 through stage 4 chronic kidney disease, or unspecified chronic kidney disease: Secondary | ICD-10-CM | POA: Diagnosis not present

## 2020-08-22 DIAGNOSIS — I5033 Acute on chronic diastolic (congestive) heart failure: Secondary | ICD-10-CM | POA: Diagnosis not present

## 2020-08-22 DIAGNOSIS — I2510 Atherosclerotic heart disease of native coronary artery without angina pectoris: Secondary | ICD-10-CM | POA: Diagnosis not present

## 2020-08-23 ENCOUNTER — Other Ambulatory Visit: Payer: Self-pay | Admitting: Internal Medicine

## 2020-08-23 ENCOUNTER — Other Ambulatory Visit: Payer: Self-pay

## 2020-08-23 DIAGNOSIS — M1712 Unilateral primary osteoarthritis, left knee: Secondary | ICD-10-CM | POA: Diagnosis not present

## 2020-08-23 DIAGNOSIS — M179 Osteoarthritis of knee, unspecified: Secondary | ICD-10-CM | POA: Diagnosis not present

## 2020-08-23 MED ORDER — OXYCODONE HCL 5 MG PO TABS: 5.0000 mg | ORAL_TABLET | Freq: Four times a day (QID) | ORAL | 0 refills | Status: DC | PRN

## 2020-08-23 NOTE — Telephone Encounter (Signed)
Patient is requesting a refill of the following medications: Requested Prescriptions   Pending Prescriptions Disp Refills   oxyCODONE (OXY IR/ROXICODONE) 5 MG immediate release tablet 5 tablet 0    Sig: Take 1 tablet (5 mg total) by mouth every 6 (six) hours as needed for up to 5 doses for severe pain (chronic knee and back pain).    Date of patient request: 08/23/20  Last office visit: 03/11/20  Date of last refill: 05/15/20 Last refill amount: 5 Follow up time period per chart: 09/06/20

## 2020-08-23 NOTE — Telephone Encounter (Signed)
pt is asking for a refill of her oxyCODONE (OXY IR/ROXICODONE) 5 MG immediate release tablet. Pt states she has been released from the nursing home and was advise that her PCP will be refilling the medications.  **Sent to [facility]**

## 2020-08-26 ENCOUNTER — Telehealth (HOSPITAL_COMMUNITY): Payer: Self-pay | Admitting: *Deleted

## 2020-08-26 DIAGNOSIS — I890 Lymphedema, not elsewhere classified: Secondary | ICD-10-CM | POA: Diagnosis not present

## 2020-08-26 DIAGNOSIS — I130 Hypertensive heart and chronic kidney disease with heart failure and stage 1 through stage 4 chronic kidney disease, or unspecified chronic kidney disease: Secondary | ICD-10-CM | POA: Diagnosis not present

## 2020-08-26 DIAGNOSIS — I5033 Acute on chronic diastolic (congestive) heart failure: Secondary | ICD-10-CM | POA: Diagnosis not present

## 2020-08-26 DIAGNOSIS — I82443 Acute embolism and thrombosis of tibial vein, bilateral: Secondary | ICD-10-CM | POA: Diagnosis not present

## 2020-08-26 DIAGNOSIS — D631 Anemia in chronic kidney disease: Secondary | ICD-10-CM | POA: Diagnosis not present

## 2020-08-26 DIAGNOSIS — E1122 Type 2 diabetes mellitus with diabetic chronic kidney disease: Secondary | ICD-10-CM | POA: Diagnosis not present

## 2020-08-26 DIAGNOSIS — E785 Hyperlipidemia, unspecified: Secondary | ICD-10-CM | POA: Diagnosis not present

## 2020-08-26 DIAGNOSIS — I2510 Atherosclerotic heart disease of native coronary artery without angina pectoris: Secondary | ICD-10-CM | POA: Diagnosis not present

## 2020-08-26 DIAGNOSIS — N1832 Chronic kidney disease, stage 3b: Secondary | ICD-10-CM | POA: Diagnosis not present

## 2020-08-26 NOTE — Telephone Encounter (Signed)
Pt aware and agreeable with plan. Appts scheduled.

## 2020-08-26 NOTE — Telephone Encounter (Signed)
Pts physical therapist called to report pt O2 85-89% at rest. O2 with minimal exertion 75-79%. Pts c/o shortness of breath and chest heaviness no chest pain. Pt also has swelling in her feet. Pts weight today 363.4lbs. Pt said she has not missed any meds.   Routed to [employer] for advice

## 2020-08-27 ENCOUNTER — Telehealth: Payer: Self-pay | Admitting: Internal Medicine

## 2020-08-27 DIAGNOSIS — R0602 Shortness of breath: Secondary | ICD-10-CM

## 2020-08-27 DIAGNOSIS — G4733 Obstructive sleep apnea (adult) (pediatric): Secondary | ICD-10-CM

## 2020-08-27 NOTE — Telephone Encounter (Signed)
Why does she need a referral if she already has an appointment scheduled?

## 2020-08-27 NOTE — Telephone Encounter (Signed)
Referral ordered

## 2020-08-27 NOTE — Telephone Encounter (Signed)
   Patient requesting referral to Pulmonary:[facility] Patient scheduled for 8/31 for sleep consult

## 2020-08-28 ENCOUNTER — Telehealth (HOSPITAL_COMMUNITY): Payer: Self-pay | Admitting: Family Medicine

## 2020-08-28 ENCOUNTER — Other Ambulatory Visit (HOSPITAL_COMMUNITY): Payer: Self-pay | Admitting: *Deleted

## 2020-08-28 MED ORDER — TORSEMIDE 20 MG PO TABS: 60.0000 mg | ORAL_TABLET | Freq: Two times a day (BID) | ORAL | 3 refills | Status: DC

## 2020-08-28 NOTE — Telephone Encounter (Signed)
Received vm from pt requesting torsemide refill. Refill was sent to pharmacy.

## 2020-08-28 NOTE — Telephone Encounter (Signed)
Pt request medication consult in regard Torsemide increase, pt stated she called left messages with triage, please advise

## 2020-08-29 DIAGNOSIS — N1832 Chronic kidney disease, stage 3b: Secondary | ICD-10-CM | POA: Diagnosis not present

## 2020-08-29 DIAGNOSIS — I130 Hypertensive heart and chronic kidney disease with heart failure and stage 1 through stage 4 chronic kidney disease, or unspecified chronic kidney disease: Secondary | ICD-10-CM | POA: Diagnosis not present

## 2020-08-29 DIAGNOSIS — I82443 Acute embolism and thrombosis of tibial vein, bilateral: Secondary | ICD-10-CM | POA: Diagnosis not present

## 2020-08-29 DIAGNOSIS — D631 Anemia in chronic kidney disease: Secondary | ICD-10-CM | POA: Diagnosis not present

## 2020-08-29 DIAGNOSIS — I5033 Acute on chronic diastolic (congestive) heart failure: Secondary | ICD-10-CM | POA: Diagnosis not present

## 2020-08-29 DIAGNOSIS — E1122 Type 2 diabetes mellitus with diabetic chronic kidney disease: Secondary | ICD-10-CM | POA: Diagnosis not present

## 2020-08-29 DIAGNOSIS — E785 Hyperlipidemia, unspecified: Secondary | ICD-10-CM | POA: Diagnosis not present

## 2020-08-29 DIAGNOSIS — I890 Lymphedema, not elsewhere classified: Secondary | ICD-10-CM | POA: Diagnosis not present

## 2020-08-29 DIAGNOSIS — I2510 Atherosclerotic heart disease of native coronary artery without angina pectoris: Secondary | ICD-10-CM | POA: Diagnosis not present

## 2020-08-30 ENCOUNTER — Telehealth (HOSPITAL_COMMUNITY): Payer: Self-pay | Admitting: Surgery

## 2020-08-30 ENCOUNTER — Ambulatory Visit (HOSPITAL_COMMUNITY)
Admission: RE | Admit: 2020-08-30 | Discharge: 2020-08-30 | Disposition: A | Discharge status: Home / Self Care | Payer: Medicare PPO | Source: Ambulatory Visit | Attending: Cardiology | Admitting: Cardiology

## 2020-08-30 DIAGNOSIS — I5032 Chronic diastolic (congestive) heart failure: Secondary | ICD-10-CM | POA: Insufficient documentation

## 2020-08-30 DIAGNOSIS — R0602 Shortness of breath: Secondary | ICD-10-CM | POA: Diagnosis not present

## 2020-08-30 LAB — BASIC METABOLIC PANEL
Anion gap: 8 (ref 5–15)
BUN: 19 mg/dL (ref 8–23)
CO2: 35 mmol/L — ABNORMAL HIGH (ref 22–32)
Calcium: 8.9 mg/dL (ref 8.9–10.3)
Chloride: 97 mmol/L — ABNORMAL LOW (ref 98–111)
Creatinine, Ser: 1.41 mg/dL — ABNORMAL HIGH (ref 0.44–1.00)
GFR, Estimated: 40 mL/min — ABNORMAL LOW (ref >60)
Glucose, Bld: 124 mg/dL — ABNORMAL HIGH (ref 70–99)
Potassium: 4.3 mmol/L (ref 3.5–5.1)
Sodium: 140 mmol/L (ref 135–145)

## 2020-08-30 NOTE — Progress Notes (Signed)
During patients lab appt, pt voiced concerns of never hearing back reagrding medication changes, pulm referral and follow up appt   Advised med updates were returned to pharm on 7/27, today's visit is only for labs but would arrange follow up with APP to further discuss diuretics and/increased SOB, referral to pulm placed to discuss need for oxygen.  Apologized return call was not made and concerns given to clinic supervisor Debara Pickett. Pt appreciative of time

## 2020-08-30 NOTE — Telephone Encounter (Signed)
April [name] contacted after being informed of her concerns during earleir lab appt.  Her primary complaint seems to be associated with her Torsemide --saying that she recently ran out secondary to pharmacy saying that the could not fill the medication through insurance as according to prescription she should not be out of this medication.  I see new prescription sent on 7/27 with additional comment that patient can take additional tablets as instructed by our clinic.  I informed her that the prescription should be refilled now.  I also informed her that the [facility] she saw earlier sent in referral to Pulmonolgy per APP at her request.  She was also added to APP schedule next week on Friday for ongoing SOB.  She was grateful for the call and I encouraged her to call back with any questions or concerns.

## 2020-09-02 DIAGNOSIS — E785 Hyperlipidemia, unspecified: Secondary | ICD-10-CM | POA: Diagnosis not present

## 2020-09-02 DIAGNOSIS — D631 Anemia in chronic kidney disease: Secondary | ICD-10-CM | POA: Diagnosis not present

## 2020-09-02 DIAGNOSIS — I5033 Acute on chronic diastolic (congestive) heart failure: Secondary | ICD-10-CM | POA: Diagnosis not present

## 2020-09-02 DIAGNOSIS — I2510 Atherosclerotic heart disease of native coronary artery without angina pectoris: Secondary | ICD-10-CM | POA: Diagnosis not present

## 2020-09-02 DIAGNOSIS — N1832 Chronic kidney disease, stage 3b: Secondary | ICD-10-CM | POA: Diagnosis not present

## 2020-09-02 DIAGNOSIS — E1122 Type 2 diabetes mellitus with diabetic chronic kidney disease: Secondary | ICD-10-CM | POA: Diagnosis not present

## 2020-09-02 DIAGNOSIS — I130 Hypertensive heart and chronic kidney disease with heart failure and stage 1 through stage 4 chronic kidney disease, or unspecified chronic kidney disease: Secondary | ICD-10-CM | POA: Diagnosis not present

## 2020-09-02 DIAGNOSIS — I82443 Acute embolism and thrombosis of tibial vein, bilateral: Secondary | ICD-10-CM | POA: Diagnosis not present

## 2020-09-02 DIAGNOSIS — I890 Lymphedema, not elsewhere classified: Secondary | ICD-10-CM | POA: Diagnosis not present

## 2020-09-03 ENCOUNTER — Telehealth: Payer: Self-pay | Admitting: Pharmacist

## 2020-09-03 ENCOUNTER — Other Ambulatory Visit (HOSPITAL_COMMUNITY): Payer: Self-pay | Admitting: *Deleted

## 2020-09-03 DIAGNOSIS — D631 Anemia in chronic kidney disease: Secondary | ICD-10-CM | POA: Diagnosis not present

## 2020-09-03 DIAGNOSIS — I2510 Atherosclerotic heart disease of native coronary artery without angina pectoris: Secondary | ICD-10-CM | POA: Diagnosis not present

## 2020-09-03 DIAGNOSIS — I82443 Acute embolism and thrombosis of tibial vein, bilateral: Secondary | ICD-10-CM | POA: Diagnosis not present

## 2020-09-03 DIAGNOSIS — I130 Hypertensive heart and chronic kidney disease with heart failure and stage 1 through stage 4 chronic kidney disease, or unspecified chronic kidney disease: Secondary | ICD-10-CM | POA: Diagnosis not present

## 2020-09-03 DIAGNOSIS — E1122 Type 2 diabetes mellitus with diabetic chronic kidney disease: Secondary | ICD-10-CM | POA: Diagnosis not present

## 2020-09-03 DIAGNOSIS — I890 Lymphedema, not elsewhere classified: Secondary | ICD-10-CM | POA: Diagnosis not present

## 2020-09-03 DIAGNOSIS — E785 Hyperlipidemia, unspecified: Secondary | ICD-10-CM | POA: Diagnosis not present

## 2020-09-03 DIAGNOSIS — I5033 Acute on chronic diastolic (congestive) heart failure: Secondary | ICD-10-CM | POA: Diagnosis not present

## 2020-09-03 DIAGNOSIS — N1832 Chronic kidney disease, stage 3b: Secondary | ICD-10-CM | POA: Diagnosis not present

## 2020-09-03 MED ORDER — TORSEMIDE 20 MG PO TABS: 60.0000 mg | ORAL_TABLET | Freq: Two times a day (BID) | ORAL | 3 refills | Status: DC

## 2020-09-03 NOTE — Telephone Encounter (Signed)
Patient was given Eliquis Free trial card in April to use when she got out of rehab. She cannot find the card and requests a replacement.  Eliquis 30-day Free Trial card printed and placed in front office cabinet for patient to pick up.

## 2020-09-04 DIAGNOSIS — I5033 Acute on chronic diastolic (congestive) heart failure: Secondary | ICD-10-CM | POA: Diagnosis not present

## 2020-09-04 DIAGNOSIS — I130 Hypertensive heart and chronic kidney disease with heart failure and stage 1 through stage 4 chronic kidney disease, or unspecified chronic kidney disease: Secondary | ICD-10-CM | POA: Diagnosis not present

## 2020-09-04 DIAGNOSIS — I890 Lymphedema, not elsewhere classified: Secondary | ICD-10-CM | POA: Diagnosis not present

## 2020-09-04 DIAGNOSIS — E1122 Type 2 diabetes mellitus with diabetic chronic kidney disease: Secondary | ICD-10-CM | POA: Diagnosis not present

## 2020-09-04 DIAGNOSIS — E785 Hyperlipidemia, unspecified: Secondary | ICD-10-CM | POA: Diagnosis not present

## 2020-09-04 DIAGNOSIS — N1832 Chronic kidney disease, stage 3b: Secondary | ICD-10-CM | POA: Diagnosis not present

## 2020-09-04 DIAGNOSIS — I2510 Atherosclerotic heart disease of native coronary artery without angina pectoris: Secondary | ICD-10-CM | POA: Diagnosis not present

## 2020-09-04 DIAGNOSIS — D631 Anemia in chronic kidney disease: Secondary | ICD-10-CM | POA: Diagnosis not present

## 2020-09-04 DIAGNOSIS — I82443 Acute embolism and thrombosis of tibial vein, bilateral: Secondary | ICD-10-CM | POA: Diagnosis not present

## 2020-09-05 DIAGNOSIS — I82443 Acute embolism and thrombosis of tibial vein, bilateral: Secondary | ICD-10-CM | POA: Diagnosis not present

## 2020-09-05 DIAGNOSIS — E1122 Type 2 diabetes mellitus with diabetic chronic kidney disease: Secondary | ICD-10-CM | POA: Diagnosis not present

## 2020-09-05 DIAGNOSIS — D631 Anemia in chronic kidney disease: Secondary | ICD-10-CM | POA: Diagnosis not present

## 2020-09-05 DIAGNOSIS — E785 Hyperlipidemia, unspecified: Secondary | ICD-10-CM | POA: Diagnosis not present

## 2020-09-05 DIAGNOSIS — G8929 Other chronic pain: Secondary | ICD-10-CM | POA: Insufficient documentation

## 2020-09-05 DIAGNOSIS — I130 Hypertensive heart and chronic kidney disease with heart failure and stage 1 through stage 4 chronic kidney disease, or unspecified chronic kidney disease: Secondary | ICD-10-CM | POA: Diagnosis not present

## 2020-09-05 DIAGNOSIS — N1832 Chronic kidney disease, stage 3b: Secondary | ICD-10-CM | POA: Diagnosis not present

## 2020-09-05 DIAGNOSIS — I890 Lymphedema, not elsewhere classified: Secondary | ICD-10-CM | POA: Diagnosis not present

## 2020-09-05 DIAGNOSIS — I2510 Atherosclerotic heart disease of native coronary artery without angina pectoris: Secondary | ICD-10-CM | POA: Diagnosis not present

## 2020-09-05 DIAGNOSIS — I5033 Acute on chronic diastolic (congestive) heart failure: Secondary | ICD-10-CM | POA: Diagnosis not present

## 2020-09-05 NOTE — Patient Instructions (Addendum)
   Medications changes include :     Your prescription(s) have been submitted to your pharmacy. Please take as directed and contact our office if you believe you are having problem(s) with the medication(s).   A referral was ordered for        Someone from their office will call you to schedule an appointment.    Please followup in 3 months

## 2020-09-05 NOTE — Progress Notes (Signed)
Subjective:    Patient ID: April [name], female    DOB: May 21, 1950, 70 y.o.   MRN: [id number]  HPI The patient is here for follow up of their chronic medical problems, including htn, b/l LE DVT on eliquis, HFpEF, DM, gerd, chronic knee pain, neuropathy, h/o gout, CKD, hyperlipidemia, B12 def, leg edema,   Just a1c ? Hemo/onc referral - uration or a/c  Medications and allergies reviewed with patient and updated if appropriate.  Patient Active Problem List   Diagnosis Date Noted   Hematoma of abdominal wall, initial encounter 05/24/2020   Cellulitis, leg 05/08/2020   Leg DVT (deep venous thromboembolism), acute, bilateral ([place]) 05/08/2020   Acute respiratory failure with hypoxia and hypercapnia (HCC) 05/08/2020   SOB (shortness of breath)    Acute idiopathic gout of right foot    Morbid obesity (HCC)    Idiopathic chronic gout of multiple sites without tophus    Cellulitis 02/02/2019   Pneumonia due to COVID-19 virus 12/10/2018   COVID-19 virus infection 12/06/2018   Right hip pain 11/09/2018   Osteomyelitis of ankle or foot, acute, left ([place]) 09/07/2018   Osteomyelitis of ankle or foot, left, acute ([place]) 09/07/2018   Onychomycosis 05/19/2018   Idiopathic chronic venous hypertension of right lower extremity with ulcer and inflammation ([place]) 05/19/2018   Skin lumps 01/14/2018   Bilateral leg cramps 12/14/2017   Left shoulder pain 08/14/2016   Meralgia paresthetica 07/16/2016   Chronic left-sided low back pain with left-sided sciatica 06/02/2016   Osteopenia 01/11/2016   CKD (chronic kidney disease) stage 3, GFR 30-59 ml/min (HCC) 01/02/2016   Hand paresthesia 07/18/2015   Carpal tunnel syndrome 06/07/2015   Family history of colon cancer    Diabetes mellitus with neurological manifestations ([place]) 04/18/2015   Bilateral leg edema 04/11/2015   Abnormality of gait 01/03/2015   Hereditary and idiopathic peripheral neuropathy 01/03/2015   GERD (gastroesophageal reflux  disease) 06/17/2014   Hx of colonic polyps 12/15/2012   Vitamin B12 deficiency 11/03/2012   Intrinsic asthma 03/23/2012   Chronic diastolic heart failure ([place]) 02/20/2011   OSA (obstructive sleep apnea) 09/17/2010   URINARY URGENCY 01/08/2010   CAD, NATIVE VESSEL 11/20/2008   Osteoarthritis of knees, bilateral 06/14/2008   Hyperlipidemia 05/10/2007   Essential hypertension 01/18/2007   Hypokalemia 04/30/2006   HX, PERSONAL, PEPTIC ULCER DISEASE 04/30/2006    Current Outpatient Medications on File Prior to Visit  Medication Sig Dispense Refill   acetaminophen (TYLENOL) 325 MG tablet Take 2 tablets (650 mg total) by mouth every 6 (six) hours as needed for moderate pain (or Fever >/= 101). 20 tablet 0   allopurinol (ZYLOPRIM) 100 MG tablet Take 2 tablets (200 mg total) by mouth daily. 180 tablet 1   apixaban (ELIQUIS) 5 MG TABS tablet Continue with 10 mg twice daily until 05/19/2020 afterwards take 5 mg twice daily 60 tablet 2   budesonide-formoterol (SYMBICORT) 160-4.5 MCG/ACT inhaler one - two inhalations every 12 hours; gargle and spit after use 1 Inhaler 4   calcium carbonate (TUMS - DOSED IN MG ELEMENTAL CALCIUM) 500 MG chewable tablet Chew 2 tablets by mouth daily as needed for indigestion or heartburn.     carvedilol (COREG) 12.5 MG tablet TAKE 1 TABLET(12.5 MG) BY MOUTH TWICE DAILY WITH A MEAL 180 tablet 0   cholecalciferol (VITAMIN D3) 25 MCG (1000 UNIT) tablet Take 1 tablet (1,000 Units total) by mouth daily. 90 tablet 1   colchicine 0.6 MG tablet Take 2 tabs po at  onset of gout flare, then one hour later take 1 tab 30 tablet 1   cyanocobalamin (,VITAMIN B-12,) 1000 MCG/ML injection INJECT 1ML INTO MUSCLE EVERY 30 DAYS 10 mL 5   diclofenac sodium (VOLTAREN) 1 % GEL Apply 2 g topically 4 (four) times daily. 100 g 11   famotidine (PEPCID) 20 MG tablet Take 1 tablet (20 mg total) by mouth 2 (two) times daily. 180 tablet 3   ferrous sulfate 325 (65 FE) MG tablet Take 1 tablet (325 mg  total) by mouth every Monday, Wednesday, and Friday. 60 tablet 0   folic acid (FOLVITE) 542 MCG tablet Take 1 tablet (400 mcg total) by mouth at bedtime. Take 400 mcg by mouth at bedtime. 90 tablet 1   gabapentin (NEURONTIN) 100 MG capsule Take 1 capsule (100 mg total) by mouth 3 (three) times daily. 90 capsule 1   hydrOXYzine (ATARAX/VISTARIL) 25 MG tablet Take 1 tablet (25 mg total) by mouth 3 (three) times daily as needed for itching. 30 tablet 0   montelukast (SINGULAIR) 10 MG tablet Take 1 tablet (10 mg total) by mouth at bedtime. 30 tablet 5   Nutritional Supplements (,FEEDING SUPPLEMENT, PROSOURCE PLUS) liquid Take 30 mLs by mouth 2 (two) times daily between meals.     Oxcarbazepine (TRILEPTAL) 300 MG tablet Take 1 tablet (300 mg total) by mouth 2 (two) times daily. 180 tablet 1   oxyCODONE (OXY IR/ROXICODONE) 5 MG immediate release tablet Take 1 tablet (5 mg total) by mouth every 6 (six) hours as needed for severe pain (chronic knee and back pain). 30 tablet 0   polyethylene glycol (MIRALAX / GLYCOLAX) 17 g packet Take 17 g by mouth daily. 14 each 0   senna-docusate (SENOKOT-S) 8.6-50 MG tablet Take 1 tablet by mouth at bedtime as needed for mild constipation. 30 tablet 0   simvastatin (ZOCOR) 40 MG tablet Take 1 tablet (40 mg total) by mouth at bedtime. 90 tablet 1   spironolactone (ALDACTONE) 25 MG tablet TAKE 1 TABLET(25 MG) BY MOUTH DAILY 90 tablet 0   torsemide (DEMADEX) 20 MG tablet Take 3 tablets (60 mg total) by mouth 2 (two) times daily. May take an additional tablet as directed by CHF clinic. 210 tablet 3   VENTOLIN HFA 108 (90 Base) MCG/ACT inhaler Inhale 2 puffs into the lungs every 4 (four) hours as needed for wheezing.     No current facility-administered medications on file prior to visit.    Past Medical History:  Diagnosis Date   Anemia    Asthma    CAD (coronary artery disease)    Carpal tunnel syndrome    Cellulitis of both lower extremities 04/11/2015   CHF  (congestive heart failure) (HCC)    Chronic kidney disease    CKI- followed by [facility]   Colon polyp, hyperplastic [id number]   Complication of anesthesia 1999   svt with renal calculi surgery, no problems since   CTS (carpal tunnel syndrome)    bilateral   Diabetes mellitus    Eczema    GERD (gastroesophageal reflux disease)    History of kidney stones 1999   Hyperlipidemia    Hypertension    Leg ulcer ([place]) 04/24/2015   Right lateral leg No evidence of an infection Monitor closely Keep edema controlled    Leg ulcer (HCC)    right lower   Meralgia paresthetica    Dr. [name]   Morbid obesity [facility])    Morbid obesity ([place])  Neuropathy    toes and legs   Osteoarthrosis, unspecified whether generalized or localized, lower leg    knee   PUD (peptic ulcer disease)    Shortness of breath dyspnea    with exertion   Sleep apnea    per progress note 02/25/2018   Type II or unspecified type diabetes mellitus without mention of complication, not stated as uncontrolled    Unspecified hereditary and idiopathic peripheral neuropathy    Urticaria    Vitamin B12 deficiency    Wears glasses    Wound cellulitis    right upper leg, healing well    Past Surgical History:  Procedure Laterality Date   ABDOMINAL HYSTERECTOMY     BREAST BIOPSY Right 07/08/2018   CARDIAC CATHETERIZATION  2002   non obstructive disease   colonoscopy with polypectomy  2007 & 2012    hyperplastic ;Dr Watt Climes   COLONOSCOPY WITH PROPOFOL N/A 06/04/2015   Procedure: COLONOSCOPY WITH PROPOFOL;  Surgeon: Jerene Bears, MD;  Location: [facility];  Service: Gastroenterology;  Laterality: N/A;   DEBRIDEMENT LEG Right 03/02/2018   WOUND VAC APPLIED   DEBRIDEMENT LEG Right 05/27/2018   RIGHT LOWER LEG DEBRIDEMENT,  SKIN GRAFT, VAC PLACEMENT    DILATION AND CURETTAGE OF UTERUS     multiple   HEMORRHOID SURGERY     I & D EXTREMITY Right 03/02/2018   Procedure: RIGHT LEG DEBRIDEMENT AND PLACE VAC;  Surgeon: [name], MD;  Location: [facility];  Service: Orthopedics;  Laterality: Right;   I & D EXTREMITY Right 03/04/2018   Procedure: REPEAT IRRIGATION AND DEBRIDEMENT RIGHT LEG, PLACE WOUND VAC;  Surgeon: [name], MD;  Location: [place];  Service: Orthopedics;  Laterality: Right;   I & D EXTREMITY Right 03/30/2018   Procedure: IRRIGATION AND DEBRIDEMENT RIGHT LEG, APPLY WOUND VAC;  Surgeon: [name], MD;  Location: [place];  Service: Orthopedics;  Laterality: Right;   I & D EXTREMITY Right 05/27/2018   Procedure: RIGHT LOWER LEG DEBRIDEMENT,  SKIN GRAFT, VAC PLACEMENT;  Surgeon: [name], MD;  Location: [place];  Service: Orthopedics;  Laterality: Right;  RIGHT LOWER LEG DEBRIDEMENT,  SKIN GRAFT, VAC PLACEMENT   renal calculi  12/1997   SVT with induction of anesthesia   right knee arthroscopy     SKIN SPLIT GRAFT Right 03/04/2018   Procedure: POSSIBLE SPLIT THICKNESS SKIN GRAFT;  Surgeon: [name], MD;  Location: [place];  Service: Orthopedics;  Laterality: Right;   SKIN SPLIT GRAFT Right 04/01/2018   Procedure: REPEAT IRRIGATION AND DEBRIDEMENT RIGHT LEG, APPLY SPLIT THICKNESS SKIN GRAFT;  Surgeon: [name], MD;  Location: [place];  Service: Orthopedics;  Laterality: Right;   TONSILLECTOMY AND ADENOIDECTOMY      Social History   Socioeconomic History   Marital status: Married    Spouse name: Dwight   Number of children: 1   Years of education: BS   Highest education level: Not on file  Occupational History   Occupation: Disabled    Employer: RETIRED  Tobacco Use   Smoking status: Never   Smokeless tobacco: Never  Vaping Use   Vaping Use: Never used  Substance and Sexual Activity   Alcohol use: No    Alcohol/week: 0.0 standard drinks   Drug use: No   Sexual activity: Not on file  Other Topics Concern   Not on file  Social History Narrative   Patient lives at home with her husband [name]  Patient is retired and has a Conservation officer, nature.    Caffeine - some times.    Right handed.   Social Determinants of Health   Financial Resource Strain: Not on file  Food Insecurity: Not on file  Transportation Needs: Not on file  Physical Activity: Not on file  Stress: Not on file  Social Connections: Not on file    Family History  Problem Relation Age of Onset   Colon cancer Mother    Prostate cancer Father    Colon cancer Father    Diabetes Maternal Aunt    Breast cancer Maternal Aunt    Diabetes Maternal Uncle    Diabetes Paternal Aunt    Stroke Paternal Aunt        > 63   Heart disease Paternal Aunt    Diabetes Paternal Uncle    Breast cancer Maternal Aunt         X 2   Breast cancer Cousin     Review of Systems     Objective:  There were no vitals filed for this visit. BP Readings from Last 3 Encounters:  08/14/20 124/80  06/11/20 138/82  05/24/20 126/84   Wt Readings from Last 3 Encounters:  06/11/20 (!) 370 lb 9.6 oz (168.1 kg)  05/14/20 (!) 362 lb 14 oz (164.6 kg)  10/20/19 (!) 340 lb (154.2 kg)   There is no height or weight on file to calculate BMI.   Physical Exam    Constitutional: Appears well-developed and well-nourished. No distress.  HENT:  Head: Normocephalic and atraumatic.  Neck: Neck supple. No tracheal deviation present. No thyromegaly present.  No cervical lymphadenopathy Cardiovascular: Normal rate, regular rhythm and normal heart sounds.   No murmur heard. No carotid bruit .  No edema Pulmonary/Chest: Effort normal and breath sounds normal. No respiratory distress. No has no wheezes. No rales.  Skin: Skin is warm and dry. Not diaphoretic.  Psychiatric: Normal mood and affect. Behavior is normal.      Assessment & Plan:    See Problem List for Assessment and Plan of chronic medical problems.    This visit occurred during the SARS-CoV-2 public health emergency.  Safety protocols were in place, including screening questions prior to the visit, additional usage of staff PPE, and extensive cleaning of  exam room while observing appropriate contact time as indicated for disinfecting solutions.   This encounter was created in error - please disregard.

## 2020-09-06 ENCOUNTER — Encounter: Payer: Medicare PPO | Admitting: Internal Medicine

## 2020-09-06 ENCOUNTER — Encounter (HOSPITAL_COMMUNITY): Payer: Medicare PPO

## 2020-09-06 DIAGNOSIS — E782 Mixed hyperlipidemia: Secondary | ICD-10-CM

## 2020-09-06 DIAGNOSIS — R600 Localized edema: Secondary | ICD-10-CM

## 2020-09-06 DIAGNOSIS — N183 Chronic kidney disease, stage 3 unspecified: Secondary | ICD-10-CM

## 2020-09-06 DIAGNOSIS — G8929 Other chronic pain: Secondary | ICD-10-CM

## 2020-09-06 DIAGNOSIS — K219 Gastro-esophageal reflux disease without esophagitis: Secondary | ICD-10-CM

## 2020-09-06 DIAGNOSIS — I5032 Chronic diastolic (congestive) heart failure: Secondary | ICD-10-CM

## 2020-09-06 DIAGNOSIS — E1140 Type 2 diabetes mellitus with diabetic neuropathy, unspecified: Secondary | ICD-10-CM

## 2020-09-06 DIAGNOSIS — I10 Essential (primary) hypertension: Secondary | ICD-10-CM

## 2020-09-06 DIAGNOSIS — E538 Deficiency of other specified B group vitamins: Secondary | ICD-10-CM

## 2020-09-06 DIAGNOSIS — M109 Gout, unspecified: Secondary | ICD-10-CM

## 2020-09-06 DIAGNOSIS — I82403 Acute embolism and thrombosis of unspecified deep veins of lower extremity, bilateral: Secondary | ICD-10-CM

## 2020-09-09 ENCOUNTER — Other Ambulatory Visit: Payer: Self-pay | Admitting: Internal Medicine

## 2020-09-09 NOTE — Progress Notes (Signed)
Advanced Heart Failure Clinic Note   PCP: [name], MD PCP-Cardiologist: None  HF Cardiologist: Dr. Haroldine Laws  Reason for visit: post hospital f/u for chronic diastolic heart failure   HPI: April [name] is a 70 year old woman with history of morbid obesity Body mass index is 65.23 kg/m., DM2, HTN, HLD, mild-to- moderate CAD, asthma, and chronic dHF.  She underwent catheterization in (2/08), which showed 50-60% LAD lesion and 30% RCA lesion.  EF was 70%.  She had mild pulmonary hypertension, primarily due to high output state.  Her mean pulmonary artery pressure was 27, wedge pressure was 10. PVR was 1.80 Woods units. Her last echo was in 2017 and showed normal LVEF 65-70%, G1DD, RV normal.    She was admitted for a/c dCHF. She presented to the [facility] on 05/07[facility]20 w/ complaints of increasing dyspnea w/ intermittent wheezing and cough + leg pain. Endorsed 30 lb wt gain since January.  She was hypoxic, oxygen in the 80s. She was admitted by [facility] for acute hypoxic respiratory failure, with likely component of acute asthma exacerbation w/ ? CHF contributing, as well as for management of acute bilateral LE DVT w/ ? Superimposed cellulitis. Treated w/ IV abx and IV Lasix. Echo showed normal LVEF 60-65%. RV normal. She was diuresed and placed back on PO diuretics, switched from lasix 80 bid to torsemide 60 mg d/c. D/c wt 371 lb.   Today she returns for an unscheduled visit to discuss her HF medications and Pulmonary referral. Still has fatigue, not sleeping well, still SOB with PT, dizzy when she stands and now has new cough. She is still very sedentary at baseline w/ limited mobility due to marked obesity and knee/hip OA but working w/ home PT/OT.Denies CP, edema, or PND/Orthopnea. Appetite ok. No fever or chills. Weight at home 363 lbs Taking all medications.   Cardiac Studies: - Echo 4/22: EF 60-65%. RV ok.   ROS: All systems reviewed and negative except as per HPI.   Past Medical History:   Diagnosis Date   Anemia    Asthma    CAD (coronary artery disease)    Carpal tunnel syndrome    Cellulitis of both lower extremities 04/11/2015   CHF (congestive heart failure) (HCC)    Chronic kidney disease    [facility]- followed by [facility]   Colon polyp, hyperplastic 1610 & 9604   Complication of anesthesia 1999   svt with renal calculi surgery, no problems since   CTS (carpal tunnel syndrome)    bilateral   Diabetes mellitus    Eczema    GERD (gastroesophageal reflux disease)    History of kidney stones 1999   Hyperlipidemia    Hypertension    Leg ulcer ([place]) 04/24/2015   Right lateral leg No evidence of an infection Monitor closely Keep edema controlled    Leg ulcer (HCC)    right lower   Meralgia paresthetica    Dr. [name]   Morbid obesity [facility])    Morbid obesity ([place])    Neuropathy    toes and legs   Osteoarthrosis, unspecified whether generalized or localized, lower leg    knee   PUD (peptic ulcer disease)    Shortness of breath dyspnea    with exertion   Sleep apnea    per progress note 02/25/2018   Type II or unspecified type diabetes mellitus without mention of complication, not stated as uncontrolled    Unspecified hereditary and idiopathic peripheral neuropathy    Urticaria  Vitamin B12 deficiency    Wears glasses    Wound cellulitis    right upper leg, healing well   Current Outpatient Medications  Medication Sig Dispense Refill   acetaminophen (TYLENOL) 325 MG tablet Take 2 tablets (650 mg total) by mouth every 6 (six) hours as needed for moderate pain (or Fever >/= 101). 20 tablet 0   allopurinol (ZYLOPRIM) 100 MG tablet Take 2 tablets (200 mg total) by mouth daily. 180 tablet 1   budesonide-formoterol (SYMBICORT) 160-4.5 MCG/ACT inhaler one - two inhalations every 12 hours; gargle and spit after use 1 Inhaler 4   calcium carbonate (TUMS - DOSED IN MG ELEMENTAL CALCIUM) 500 MG chewable tablet Chew 2 tablets by mouth daily as needed for indigestion or  heartburn.     carvedilol (COREG) 12.5 MG tablet TAKE 1 TABLET(12.5 MG) BY MOUTH TWICE DAILY WITH A MEAL 180 tablet 0   cholecalciferol (VITAMIN D3) 25 MCG (1000 UNIT) tablet Take 1 tablet (1,000 Units total) by mouth daily. 90 tablet 1   colchicine 0.6 MG tablet Take 2 tabs po at onset of gout flare, then one hour later take 1 tab 30 tablet 1   cyanocobalamin (,VITAMIN B-12,) 1000 MCG/ML injection INJECT 1ML INTO MUSCLE EVERY 30 DAYS 10 mL 5   diclofenac sodium (VOLTAREN) 1 % GEL Apply 2 g topically 4 (four) times daily. 100 g 11   ELIQUIS 5 MG TABS tablet CONTINUE WITH 2 TABLETS(10 MG) TWICE DAILY UNTIL 05/19/2020. AFTERWARDS TAKE 1 TABLET(5 MG) TWICE DAILY 60 tablet 2   famotidine (PEPCID) 20 MG tablet Take 1 tablet (20 mg total) by mouth 2 (two) times daily. 180 tablet 3   ferrous sulfate 325 (65 FE) MG tablet Take 1 tablet (325 mg total) by mouth every Monday, Wednesday, and Friday. 60 tablet 0   folic acid (FOLVITE) 109 MCG tablet Take 1 tablet (400 mcg total) by mouth at bedtime. Take 400 mcg by mouth at bedtime. 90 tablet 1   gabapentin (NEURONTIN) 100 MG capsule Take 1 capsule (100 mg total) by mouth 3 (three) times daily. 90 capsule 1   hydrOXYzine (ATARAX/VISTARIL) 25 MG tablet Take 1 tablet (25 mg total) by mouth 3 (three) times daily as needed for itching. 30 tablet 0   montelukast (SINGULAIR) 10 MG tablet Take 1 tablet (10 mg total) by mouth at bedtime. 30 tablet 5   Nutritional Supplements (,FEEDING SUPPLEMENT, PROSOURCE PLUS) liquid Take 30 mLs by mouth 2 (two) times daily between meals.     Oxcarbazepine (TRILEPTAL) 300 MG tablet Take 1 tablet (300 mg total) by mouth 2 (two) times daily. 180 tablet 1   oxyCODONE (OXY IR/ROXICODONE) 5 MG immediate release tablet Take 1 tablet (5 mg total) by mouth every 6 (six) hours as needed for severe pain (chronic knee and back pain). 30 tablet 0   polyethylene glycol (MIRALAX / GLYCOLAX) 17 g packet Take 17 g by mouth daily. (Patient taking  differently: Take 17 g by mouth daily as needed.) 14 each 0   senna-docusate (SENOKOT-S) 8.6-50 MG tablet Take 1 tablet by mouth at bedtime as needed for mild constipation. 30 tablet 0   simvastatin (ZOCOR) 40 MG tablet Take 1 tablet (40 mg total) by mouth at bedtime. 90 tablet 1   spironolactone (ALDACTONE) 25 MG tablet TAKE 1 TABLET(25 MG) BY MOUTH DAILY 90 tablet 0   torsemide (DEMADEX) 20 MG tablet Take 3 tablets (60 mg total) by mouth 2 (two) times daily. May take an additional tablet  as directed by CHF clinic. 210 tablet 3   VENTOLIN HFA 108 (90 Base) MCG/ACT inhaler Inhale 2 puffs into the lungs every 4 (four) hours as needed for wheezing.     No current facility-administered medications for this encounter.   Allergies  Allergen Reactions   Penicillins Rash and Other (See Comments)    She was told not to take it anymore. Has patient had a PCN reaction causing immediate rash, facial/tongue/throat swelling, SOB or lightheadedness with hypotension: Yes Has patient had a PCN reaction causing severe rash involving mucus membranes or skin necrosis: No Has patient had a PCN reaction that required hospitalization: No Has patient had a PCN reaction occurring within the last 10 years: No If all of the above answers are "NO", then may proceed with Cephalosporin use.'   Shellfish Allergy Anaphylaxis   Sulfonamide Derivatives Anaphylaxis and Other (See Comments)   Hydrochlorothiazide W-Triamterene Other (See Comments)    Hypokalemia   Lotensin [Benazepril Hcl] Hives   Dipyridamole Other (See Comments)    Unknown reaction   Estrogens Other (See Comments)    Unknown reaction   Hydrochlorothiazide Other (See Comments)    Unknown reaction   Latex Other (See Comments)    Unknown reaction - stated previously during surgery gloves had to be changed, but unsure if it is still an allergy or not.    Metronidazole Other (See Comments)    Unknown reaction   Other Itching    PICKLES   Spironolactone  Other (See Comments)    UNSPECIFIED > "kidney problems"   Sulfa Antibiotics Other (See Comments)    Unknown reaction   Torsemide Other (See Comments)    Unknown reaction   Valsartan Other (See Comments)    Unknown reaction   Mustard [Allyl Isothiocyanate] Itching   Social History   Socioeconomic History   Marital status: Married    Spouse name: Dwight   Number of children: 1   Years of education: BS   Highest education level: Not on file  Occupational History   Occupation: Disabled    Employer: RETIRED  Tobacco Use   Smoking status: Never   Smokeless tobacco: Never  Vaping Use   Vaping Use: Never used  Substance and Sexual Activity   Alcohol use: No    Alcohol/week: 0.0 standard drinks   Drug use: No   Sexual activity: Not on file  Other Topics Concern   Not on file  Social History Narrative   Patient lives at home with her husband April [name]) . Patient is retired and has a Conservation officer, nature.    Caffeine - some times.   Right handed.   Social Determinants of Health   Financial Resource Strain: Not on file  Food Insecurity: Not on file  Transportation Needs: Not on file  Physical Activity: Not on file  Stress: Not on file  Social Connections: Not on file  Intimate Partner Violence: Not on file   Family History  Problem Relation Age of Onset   Colon cancer Mother    Prostate cancer Father    Colon cancer Father    Diabetes Maternal Aunt    Breast cancer Maternal Aunt    Diabetes Maternal Uncle    Diabetes Paternal Aunt    Stroke Paternal Aunt        > 58   Heart disease Paternal Aunt    Diabetes Paternal Uncle    Breast cancer Maternal Aunt         X 2  Breast cancer Cousin    BP 110/70   Pulse 86   Wt (!) 159.2 kg (351 lb)   SpO2 91%   BMI 60.25 kg/m   Wt Readings from Last 3 Encounters:  09/10/20 (!) 159.2 kg (351 lb)  06/11/20 (!) 168.1 kg (370 lb 9.6 oz)  05/14/20 (!) 164.6 kg (362 lb 14 oz)   PHYSICAL EXAM: General:  NAD. No resp  difficulty, arrived in [facility] HEENT: Normal Neck: Supple. Thick neck. Carotids 2+ bilat; no bruits. No lymphadenopathy or thryomegaly appreciated. Cor: PMI nondisplaced. Regular rate & rhythm. No rubs, gallops or murmurs. Lungs: Clear Abdomen: Obese, nontender, nondistended. No hepatosplenomegaly. No bruits or masses. Good bowel sounds. Extremities: No cyanosis, clubbing, rash, 1+ pre-tibial LE edema, R>L Neuro: Alert & oriented x 3, cranial nerves grossly intact. Moves all 4 extremities w/o difficulty. Affect pleasant.  ECG: SR 87 bpm (personally reviewed)  ASSESSMENT & PLAN: 1. Chronic Hypoxic Respiratory Failure - Multifactorial 2/2. Suspected OSA/ OHS + chronic diastolic heart failure - Needs sleep study. She has new pt consult w/ Dr. Ander Slade for sleep evaluation.  - Needs wt loss but mobility limited by severe knee/hip OA.   2. Chronic Diastolic Heart Failure  - RHC 2008 c/w mild pulmonary hypertension, primarily due to high output state.  Her mean pulmonary artery pressure was 27, wedge pressure was 10. PVR was 1.80 Woods units. - Echo (2017): LVEF 65-70%, G1DD, RV normal - Recent admit for a/c dCHF. Volume improved w/ IV Lasix.  - Echo (4/22): LVEF 60-65%, G2DD  - NYHA III, functional status difficult due to body habitus, asthma/chronic resp failure, and physical deconditioning. Exam difficult for volume, but her volume looks good today on exam and by weight. - Continue torsemide 60 mg bid. Discussed decreasing to 40 mg bid to see if this helps with her dizziness. She will think about it. - Continue spiro 25 mg daily.  - Continue carvedilol 12.5 mg bid. - No SGLT2i given risk of GU infection given morbid obesity/ large panus . - Discussed importance of sodium/fluid restriction. - Discussed importance of weights to prevent fluid overload-she is agreeable to weighing 1-2x/week. - BMET & BNP today.  3. Bilateral LE DVT (Diagnosed 4/22) - Confirmed by venous dopplers. Chest CT negative  for PE. - Now on Eliquis. No bleeding issues.  4. Asthma  - Stable. No wheezing on exam.  - On Symbicort. - Followed by PCP. New pt consult w/ pulmonology planned soon.    5. CAD:  - LHC in 2008 showed 50-60% LAD lesion and 30% RCA lesion. - No s/s of angina.  - Continue ? blocker and statin. - No ASA given eliquis for DVT tx.    6. Stage IIIb CKD - Baseline SCr ~1.5. - Followed by Dr. [name] at [facility]. - BMET today.  7. Cough - Nonproductive, non-febrile. Likely allergy vs. viral URI. - Lungs clear on exam. - I encouraged her to take a COVID test & get booster at her PCP visit this week.   Follow up in 2-3 months with Dr. Haroldine Laws as scheduled.  [place], FNP 09/10/20

## 2020-09-09 NOTE — Telephone Encounter (Signed)
1.Medication Requested: apixaban (ELIQUIS) 5 MG TABS tablet  2. Pharmacy (Name, Street, [name]): [facility] [phone] - [place], [place] [address]  Phone:  [phone] Fax:  [phone]   3. On Med List: yes  4. Last Visit with PCP: 04.22.22  5. Next visit date with PCP: 08.12.22   Agent: Please be advised that RX refills may take up to 3 business days. We ask that you follow-up with your pharmacy.

## 2020-09-10 ENCOUNTER — Other Ambulatory Visit: Payer: Self-pay

## 2020-09-10 ENCOUNTER — Ambulatory Visit (HOSPITAL_COMMUNITY)
Admission: RE | Admit: 2020-09-10 | Discharge: 2020-09-10 | Disposition: A | Discharge status: Home / Self Care | Payer: Medicare PPO | Source: Ambulatory Visit | Attending: Family Medicine | Admitting: Family Medicine

## 2020-09-10 ENCOUNTER — Encounter (HOSPITAL_COMMUNITY): Payer: Self-pay

## 2020-09-10 VITALS — BP 110/70 | HR 86 | Wt 351.0 lb

## 2020-09-10 DIAGNOSIS — E1122 Type 2 diabetes mellitus with diabetic chronic kidney disease: Secondary | ICD-10-CM | POA: Insufficient documentation

## 2020-09-10 DIAGNOSIS — I2510 Atherosclerotic heart disease of native coronary artery without angina pectoris: Secondary | ICD-10-CM | POA: Diagnosis not present

## 2020-09-10 DIAGNOSIS — Z79899 Other long term (current) drug therapy: Secondary | ICD-10-CM | POA: Diagnosis not present

## 2020-09-10 DIAGNOSIS — R42 Dizziness and giddiness: Secondary | ICD-10-CM | POA: Diagnosis not present

## 2020-09-10 DIAGNOSIS — Z7951 Long term (current) use of inhaled steroids: Secondary | ICD-10-CM | POA: Insufficient documentation

## 2020-09-10 DIAGNOSIS — Z86718 Personal history of other venous thrombosis and embolism: Secondary | ICD-10-CM | POA: Insufficient documentation

## 2020-09-10 DIAGNOSIS — M1610 Unilateral primary osteoarthritis, unspecified hip: Secondary | ICD-10-CM | POA: Insufficient documentation

## 2020-09-10 DIAGNOSIS — M1710 Unilateral primary osteoarthritis, unspecified knee: Secondary | ICD-10-CM | POA: Diagnosis not present

## 2020-09-10 DIAGNOSIS — J9611 Chronic respiratory failure with hypoxia: Secondary | ICD-10-CM | POA: Insufficient documentation

## 2020-09-10 DIAGNOSIS — I272 Pulmonary hypertension, unspecified: Secondary | ICD-10-CM | POA: Diagnosis not present

## 2020-09-10 DIAGNOSIS — I130 Hypertensive heart and chronic kidney disease with heart failure and stage 1 through stage 4 chronic kidney disease, or unspecified chronic kidney disease: Secondary | ICD-10-CM | POA: Diagnosis not present

## 2020-09-10 DIAGNOSIS — E785 Hyperlipidemia, unspecified: Secondary | ICD-10-CM | POA: Insufficient documentation

## 2020-09-10 DIAGNOSIS — J45909 Unspecified asthma, uncomplicated: Secondary | ICD-10-CM | POA: Insufficient documentation

## 2020-09-10 DIAGNOSIS — R059 Cough, unspecified: Secondary | ICD-10-CM

## 2020-09-10 DIAGNOSIS — I5033 Acute on chronic diastolic (congestive) heart failure: Secondary | ICD-10-CM | POA: Insufficient documentation

## 2020-09-10 DIAGNOSIS — N1832 Chronic kidney disease, stage 3b: Secondary | ICD-10-CM | POA: Insufficient documentation

## 2020-09-10 DIAGNOSIS — Z6841 Body Mass Index (BMI) 40.0 and over, adult: Secondary | ICD-10-CM | POA: Diagnosis not present

## 2020-09-10 DIAGNOSIS — Z8249 Family history of ischemic heart disease and other diseases of the circulatory system: Secondary | ICD-10-CM | POA: Insufficient documentation

## 2020-09-10 DIAGNOSIS — Z7901 Long term (current) use of anticoagulants: Secondary | ICD-10-CM | POA: Diagnosis not present

## 2020-09-10 DIAGNOSIS — I5032 Chronic diastolic (congestive) heart failure: Secondary | ICD-10-CM

## 2020-09-10 DIAGNOSIS — I825Z3 Chronic embolism and thrombosis of unspecified deep veins of distal lower extremity, bilateral: Secondary | ICD-10-CM

## 2020-09-10 LAB — BASIC METABOLIC PANEL
Anion gap: 10 (ref 5–15)
BUN: 20 mg/dL (ref 8–23)
CO2: 34 mmol/L — ABNORMAL HIGH (ref 22–32)
Calcium: 9.5 mg/dL (ref 8.9–10.3)
Chloride: 93 mmol/L — ABNORMAL LOW (ref 98–111)
Creatinine, Ser: 1.26 mg/dL — ABNORMAL HIGH (ref 0.44–1.00)
GFR, Estimated: 46 mL/min — ABNORMAL LOW (ref >60)
Glucose, Bld: 81 mg/dL (ref 70–99)
Potassium: 4.7 mmol/L (ref 3.5–5.1)
Sodium: 137 mmol/L (ref 135–145)

## 2020-09-10 NOTE — Patient Instructions (Addendum)
EKG done today.  Labs done today. We will contact you only if your labs are abnormal.  No medication changes were made. Please continue all current medications as prescribed.  Your physician recommends that you keep your pending appointment.   If you have any questions or concerns before your next appointment please send [place] a message through [place] or call our office at [phone].    TO LEAVE A MESSAGE FOR THE NURSE SELECT OPTION 2, PLEASE LEAVE A MESSAGE INCLUDING: YOUR NAME DATE OF BIRTH CALL BACK NUMBER REASON FOR CALL**this is important as we prioritize the call backs  YOU WILL RECEIVE A CALL BACK THE SAME DAY AS LONG AS YOU CALL BEFORE 4:00 PM   Do the following things EVERYDAY: Weigh yourself in the morning before breakfast. Write it down and keep it in a log. Take your medicines as prescribed Eat low salt foods--Limit salt (sodium) to 2000 mg per day.  Stay as active as you can everyday Limit all fluids for the day to less than 2 liters   At the [facility], you and your health needs are our priority. As part of our continuing mission to provide you with exceptional heart care, we have created designated Provider Care Teams. These Care Teams include your primary Cardiologist (physician) and Advanced Practice Providers (APPs- Physician Assistants and Nurse Practitioners) who all work together to provide you with the care you need, when you need it.   You may see any of the following providers on your designated Care Team at your next follow up: Dr Glori Bickers Dr Haynes Kerns, NP Lyda Jester, [place] Audry Riles, PharmD   Please be sure to bring in all your medications bottles to every appointment.

## 2020-09-12 DIAGNOSIS — E785 Hyperlipidemia, unspecified: Secondary | ICD-10-CM | POA: Diagnosis not present

## 2020-09-12 DIAGNOSIS — I5033 Acute on chronic diastolic (congestive) heart failure: Secondary | ICD-10-CM | POA: Diagnosis not present

## 2020-09-12 DIAGNOSIS — E1122 Type 2 diabetes mellitus with diabetic chronic kidney disease: Secondary | ICD-10-CM | POA: Diagnosis not present

## 2020-09-12 DIAGNOSIS — I82443 Acute embolism and thrombosis of tibial vein, bilateral: Secondary | ICD-10-CM | POA: Diagnosis not present

## 2020-09-12 DIAGNOSIS — I890 Lymphedema, not elsewhere classified: Secondary | ICD-10-CM | POA: Diagnosis not present

## 2020-09-12 DIAGNOSIS — D631 Anemia in chronic kidney disease: Secondary | ICD-10-CM | POA: Diagnosis not present

## 2020-09-12 DIAGNOSIS — N1832 Chronic kidney disease, stage 3b: Secondary | ICD-10-CM | POA: Diagnosis not present

## 2020-09-12 DIAGNOSIS — I130 Hypertensive heart and chronic kidney disease with heart failure and stage 1 through stage 4 chronic kidney disease, or unspecified chronic kidney disease: Secondary | ICD-10-CM | POA: Diagnosis not present

## 2020-09-12 DIAGNOSIS — I2510 Atherosclerotic heart disease of native coronary artery without angina pectoris: Secondary | ICD-10-CM | POA: Diagnosis not present

## 2020-09-12 NOTE — Progress Notes (Signed)
Subjective:    Patient ID: April [name], female    DOB: 09-26-1950, 70 y.o.   MRN: [id number]  HPI The patient is here for follow up of their chronic medical problems, including htn, b/l LE DVT on eliquis, HFpEF, DM, gerd, chronic knee pain, neuropathy, h/o gout, CKD, hyperlipidemia, B12 def, leg edema,    Has a cough - occ brings up yellow phlegm.  It started about two weeks ago. She coughs once in a while.    She walks around her house during the day, but is otherwise sedentary  She takes the pain medication as needed only.  She takes 1-2 pills if needed.    She has trouble sleeping due to pain.  Then can not go back to sleep.    Getting injection for her knees which help - can go every 4 months.    Insurance paying - Thursday They paying for someone - Monday, Wednesday.    Medications and allergies reviewed with patient and updated if appropriate.  Patient Active Problem List   Diagnosis Date Noted   Encounter for chronic pain management 09/05/2020   Hematoma of abdominal wall, initial encounter 05/24/2020   Cellulitis, leg 05/08/2020   Leg DVT (deep venous thromboembolism), acute, bilateral ([place]) 05/08/2020   Acute respiratory failure with hypoxia and hypercapnia (HCC) 05/08/2020   SOB (shortness of breath)    Gout    Morbid obesity ([place])    Cellulitis 02/02/2019   Pneumonia due to COVID-19 virus 12/10/2018   COVID-19 virus infection 12/06/2018   Right hip pain 11/09/2018   Osteomyelitis of ankle or foot, acute, left ([place]) 09/07/2018   Osteomyelitis of ankle or foot, left, acute ([place]) 09/07/2018   Onychomycosis 05/19/2018   Idiopathic chronic venous hypertension of right lower extremity with ulcer and inflammation ([place]) 05/19/2018   Skin lumps 01/14/2018   Bilateral leg cramps 12/14/2017   Left shoulder pain 08/14/2016   Meralgia paresthetica 07/16/2016   Chronic left-sided low back pain with left-sided sciatica 06/02/2016   Osteopenia 01/11/2016   CKD  (chronic kidney disease) stage 3, GFR 30-59 ml/min (HCC) 01/02/2016   Hand paresthesia 07/18/2015   Carpal tunnel syndrome 06/07/2015   Family history of colon cancer    Diabetes mellitus with neurological manifestations ([place]) 04/18/2015   Bilateral leg edema 04/11/2015   Abnormality of gait 01/03/2015   Hereditary and idiopathic peripheral neuropathy 01/03/2015   GERD (gastroesophageal reflux disease) 06/17/2014   Hx of colonic polyps 12/15/2012   Vitamin B12 deficiency 11/03/2012   Intrinsic asthma 03/23/2012   Chronic diastolic heart failure ([place]) 02/20/2011   OSA (obstructive sleep apnea) 09/17/2010   URINARY URGENCY 01/08/2010   CAD, NATIVE VESSEL 11/20/2008   Osteoarthritis of knees, bilateral 06/14/2008   Hyperlipidemia 05/10/2007   Essential hypertension 01/18/2007   Hypokalemia 04/30/2006   HX, PERSONAL, PEPTIC ULCER DISEASE 04/30/2006    Current Outpatient Medications on File Prior to Visit  Medication Sig Dispense Refill   acetaminophen (TYLENOL) 325 MG tablet Take 2 tablets (650 mg total) by mouth every 6 (six) hours as needed for moderate pain (or Fever >/= 101). 20 tablet 0   allopurinol (ZYLOPRIM) 100 MG tablet Take 2 tablets (200 mg total) by mouth daily. 180 tablet 1   budesonide-formoterol (SYMBICORT) 160-4.5 MCG/ACT inhaler one - two inhalations every 12 hours; gargle and spit after use 1 Inhaler 4   calcium carbonate (TUMS - DOSED IN MG ELEMENTAL CALCIUM) 500 MG chewable tablet Chew 2 tablets by mouth daily  as needed for indigestion or heartburn.     carvedilol (COREG) 12.5 MG tablet TAKE 1 TABLET(12.5 MG) BY MOUTH TWICE DAILY WITH A MEAL 180 tablet 0   cholecalciferol (VITAMIN D3) 25 MCG (1000 UNIT) tablet Take 1 tablet (1,000 Units total) by mouth daily. 90 tablet 1   colchicine 0.6 MG tablet Take 2 tabs po at onset of gout flare, then one hour later take 1 tab 30 tablet 1   cyanocobalamin (,VITAMIN B-12,) 1000 MCG/ML injection INJECT 1ML INTO MUSCLE EVERY 30  DAYS 10 mL 5   diclofenac sodium (VOLTAREN) 1 % GEL Apply 2 g topically 4 (four) times daily. 100 g 11   ELIQUIS 5 MG TABS tablet CONTINUE WITH 2 TABLETS(10 MG) TWICE DAILY UNTIL 05/19/2020. AFTERWARDS TAKE 1 TABLET(5 MG) TWICE DAILY 60 tablet 2   famotidine (PEPCID) 20 MG tablet Take 1 tablet (20 mg total) by mouth 2 (two) times daily. 180 tablet 3   ferrous sulfate 325 (65 FE) MG tablet Take 1 tablet (325 mg total) by mouth every Monday, Wednesday, and Friday. 60 tablet 0   folic acid (FOLVITE) 989 MCG tablet Take 1 tablet (400 mcg total) by mouth at bedtime. Take 400 mcg by mouth at bedtime. 90 tablet 1   gabapentin (NEURONTIN) 100 MG capsule Take 1 capsule (100 mg total) by mouth 3 (three) times daily. 90 capsule 1   hydrOXYzine (ATARAX/VISTARIL) 25 MG tablet Take 1 tablet (25 mg total) by mouth 3 (three) times daily as needed for itching. 30 tablet 0   montelukast (SINGULAIR) 10 MG tablet Take 1 tablet (10 mg total) by mouth at bedtime. 30 tablet 5   Nutritional Supplements (,FEEDING SUPPLEMENT, PROSOURCE PLUS) liquid Take 30 mLs by mouth 2 (two) times daily between meals.     Oxcarbazepine (TRILEPTAL) 300 MG tablet Take 1 tablet (300 mg total) by mouth 2 (two) times daily. 180 tablet 1   oxyCODONE (OXY IR/ROXICODONE) 5 MG immediate release tablet Take 1 tablet (5 mg total) by mouth every 6 (six) hours as needed for severe pain (chronic knee and back pain). 30 tablet 0   polyethylene glycol (MIRALAX / GLYCOLAX) 17 g packet Take 17 g by mouth daily. (Patient taking differently: Take 17 g by mouth daily as needed.) 14 each 0   senna-docusate (SENOKOT-S) 8.6-50 MG tablet Take 1 tablet by mouth at bedtime as needed for mild constipation. 30 tablet 0   simvastatin (ZOCOR) 40 MG tablet Take 1 tablet (40 mg total) by mouth at bedtime. 90 tablet 1   spironolactone (ALDACTONE) 25 MG tablet TAKE 1 TABLET(25 MG) BY MOUTH DAILY 90 tablet 0   torsemide (DEMADEX) 20 MG tablet Take 3 tablets (60 mg total) by  mouth 2 (two) times daily. May take an additional tablet as directed by CHF clinic. 210 tablet 3   VENTOLIN HFA 108 (90 Base) MCG/ACT inhaler Inhale 2 puffs into the lungs every 4 (four) hours as needed for wheezing.     No current facility-administered medications on file prior to visit.    Past Medical History:  Diagnosis Date   Anemia    Asthma    CAD (coronary artery disease)    Carpal tunnel syndrome    Cellulitis of both lower extremities 04/11/2015   CHF (congestive heart failure) (HCC)    Chronic kidney disease    CKI- followed by [facility]   Colon polyp, hyperplastic 2119 & 4174   Complication of anesthesia 1999   svt with renal calculi surgery,  no problems since   CTS (carpal tunnel syndrome)    bilateral   Diabetes mellitus    Eczema    GERD (gastroesophageal reflux disease)    History of kidney stones 1999   Hyperlipidemia    Hypertension    Leg ulcer ([place]) 04/24/2015   Right lateral leg No evidence of an infection Monitor closely Keep edema controlled    Leg ulcer ([place])    right lower   Meralgia paresthetica    Dr. [name]   Morbid obesity [facility])    Morbid obesity ([place])    Neuropathy    toes and legs   Osteoarthrosis, unspecified whether generalized or localized, lower leg    knee   PUD (peptic ulcer disease)    Shortness of breath dyspnea    with exertion   Sleep apnea    per progress note 02/25/2018   Type II or unspecified type diabetes mellitus without mention of complication, not stated as uncontrolled    Unspecified hereditary and idiopathic peripheral neuropathy    Urticaria    Vitamin B12 deficiency    Wears glasses    Wound cellulitis    right upper leg, healing well    Past Surgical History:  Procedure Laterality Date   ABDOMINAL HYSTERECTOMY     BREAST BIOPSY Right 07/08/2018   CARDIAC CATHETERIZATION  2002   non obstructive disease   colonoscopy with polypectomy  2007 & 2012    hyperplastic ;Dr Watt Climes   COLONOSCOPY WITH PROPOFOL N/A  06/04/2015   Procedure: COLONOSCOPY WITH PROPOFOL;  Surgeon: Jerene Bears, MD;  Location: [facility];  Service: Gastroenterology;  Laterality: N/A;   DEBRIDEMENT LEG Right 03/02/2018   WOUND VAC APPLIED   DEBRIDEMENT LEG Right 05/27/2018   RIGHT LOWER LEG DEBRIDEMENT,  SKIN GRAFT, VAC PLACEMENT    DILATION AND CURETTAGE OF UTERUS     multiple   HEMORRHOID SURGERY     I & D EXTREMITY Right 03/02/2018   Procedure: RIGHT LEG DEBRIDEMENT AND PLACE VAC;  Surgeon: [name], MD;  Location: [facility];  Service: Orthopedics;  Laterality: Right;   I & D EXTREMITY Right 03/04/2018   Procedure: REPEAT IRRIGATION AND DEBRIDEMENT RIGHT LEG, PLACE WOUND VAC;  Surgeon: [name], MD;  Location: [facility];  Service: Orthopedics;  Laterality: Right;   I & D EXTREMITY Right 03/30/2018   Procedure: IRRIGATION AND DEBRIDEMENT RIGHT LEG, APPLY WOUND VAC;  Surgeon: [name], MD;  Location: [facility];  Service: Orthopedics;  Laterality: Right;   I & D EXTREMITY Right 05/27/2018   Procedure: RIGHT LOWER LEG DEBRIDEMENT,  SKIN GRAFT, VAC PLACEMENT;  Surgeon: [name], MD;  Location: [facility];  Service: Orthopedics;  Laterality: Right;  RIGHT LOWER LEG DEBRIDEMENT,  SKIN GRAFT, VAC PLACEMENT   renal calculi  12/1997   SVT with induction of anesthesia   right knee arthroscopy     SKIN SPLIT GRAFT Right 03/04/2018   Procedure: POSSIBLE SPLIT THICKNESS SKIN GRAFT;  Surgeon: [name], MD;  Location: [place] [facility];  Service: Orthopedics;  Laterality: Right;   SKIN SPLIT GRAFT Right 04/01/2018   Procedure: REPEAT IRRIGATION AND DEBRIDEMENT RIGHT LEG, APPLY SPLIT THICKNESS SKIN GRAFT;  Surgeon: [name], MD;  Location: [facility];  Service: Orthopedics;  Laterality: Right;   TONSILLECTOMY AND ADENOIDECTOMY      Social History   Socioeconomic History   Marital status: Married    Spouse name: [name]   Number of children: 1  Years of education: BS   Highest education level: Not on file  Occupational History    Occupation: Disabled    Employer: RETIRED  Tobacco Use   Smoking status: Never   Smokeless tobacco: Never  Vaping Use   Vaping Use: Never used  Substance and Sexual Activity   Alcohol use: No    Alcohol/week: 0.0 standard drinks   Drug use: No   Sexual activity: Not on file  Other Topics Concern   Not on file  Social History Narrative   Patient lives at home with her husband [name] Patient is retired and has a Conservation officer, nature.    Caffeine - some times.   Right handed.   Social Determinants of Health   Financial Resource Strain: Not on file  Food Insecurity: Not on file  Transportation Needs: Not on file  Physical Activity: Not on file  Stress: Not on file  Social Connections: Not on file    Family History  Problem Relation Age of Onset   Colon cancer Mother    Prostate cancer Father    Colon cancer Father    Diabetes Maternal Aunt    Breast cancer Maternal Aunt    Diabetes Maternal Uncle    Diabetes Paternal Aunt    Stroke Paternal Aunt        > 107   Heart disease Paternal Aunt    Diabetes Paternal Uncle    Breast cancer Maternal Aunt         X 2   Breast cancer Cousin     Review of Systems  Constitutional:  Negative for chills and fever.  HENT:  Negative for congestion, sinus pain and sore throat.   Respiratory:  Positive for cough (2 weeks - occ brings up phelgm), shortness of breath (chronic - no change) and wheezing (occ with asthma).   Cardiovascular:  Positive for leg swelling. Negative for chest pain and palpitations.  Neurological:  Positive for dizziness (when she stands). Negative for light-headedness and headaches.      Objective:   Vitals:   09/13/20 1334  BP: 130/80  Pulse: (!) 104  Temp: 99.1 F (37.3 C)  SpO2: 90%   BP Readings from Last 3 Encounters:  09/13/20 130/80  09/10/20 110/70  08/14/20 124/80   Wt Readings from Last 3 Encounters:  09/13/20 (!) 353 lb 9.6 oz (160.4 kg)  09/10/20 (!) 351 lb (159.2 kg)  06/11/20  (!) 370 lb 9.6 oz (168.1 kg)   Body mass index is 60.7 kg/m.   Physical Exam    Constitutional: Appears well-developed and well-nourished. No distress.  HENT:  Head: Normocephalic and atraumatic.  Neck: Neck supple. No tracheal deviation present. No thyromegaly present.  No cervical lymphadenopathy Cardiovascular: Normal rate, regular rhythm and normal heart sounds.   No murmur heard. No carotid bruit .  Chronic b/l LE edema - mild Pulmonary/Chest: Effort normal and breath sounds normal. No respiratory distress. No has no wheezes. No rales.  Skin: Skin is warm and dry. Not diaphoretic.  Psychiatric: Normal mood and affect. Behavior is normal.      Assessment & Plan:    See Problem List for Assessment and Plan of chronic medical problems.    This visit occurred during the SARS-CoV-2 public health emergency.  Safety protocols were in place, including screening questions prior to the visit, additional usage of staff PPE, and extensive cleaning of exam room while observing appropriate contact time as indicated for disinfecting solutions.

## 2020-09-13 ENCOUNTER — Encounter: Payer: Self-pay | Admitting: Internal Medicine

## 2020-09-13 ENCOUNTER — Other Ambulatory Visit: Payer: Self-pay

## 2020-09-13 ENCOUNTER — Ambulatory Visit: Payer: Medicare PPO | Admitting: Internal Medicine

## 2020-09-13 VITALS — BP 130/80 | HR 104 | Temp 99.1°F | Ht 64.0 in | Wt 353.6 lb

## 2020-09-13 DIAGNOSIS — E782 Mixed hyperlipidemia: Secondary | ICD-10-CM

## 2020-09-13 DIAGNOSIS — G8929 Other chronic pain: Secondary | ICD-10-CM

## 2020-09-13 DIAGNOSIS — G609 Hereditary and idiopathic neuropathy, unspecified: Secondary | ICD-10-CM

## 2020-09-13 DIAGNOSIS — I10 Essential (primary) hypertension: Secondary | ICD-10-CM

## 2020-09-13 DIAGNOSIS — E1140 Type 2 diabetes mellitus with diabetic neuropathy, unspecified: Secondary | ICD-10-CM | POA: Diagnosis not present

## 2020-09-13 DIAGNOSIS — I82403 Acute embolism and thrombosis of unspecified deep veins of lower extremity, bilateral: Secondary | ICD-10-CM

## 2020-09-13 DIAGNOSIS — E538 Deficiency of other specified B group vitamins: Secondary | ICD-10-CM | POA: Diagnosis not present

## 2020-09-13 DIAGNOSIS — N183 Chronic kidney disease, stage 3 unspecified: Secondary | ICD-10-CM

## 2020-09-13 DIAGNOSIS — R600 Localized edema: Secondary | ICD-10-CM | POA: Diagnosis not present

## 2020-09-13 DIAGNOSIS — M170 Bilateral primary osteoarthritis of knee: Secondary | ICD-10-CM

## 2020-09-13 DIAGNOSIS — I5032 Chronic diastolic (congestive) heart failure: Secondary | ICD-10-CM | POA: Diagnosis not present

## 2020-09-13 DIAGNOSIS — K219 Gastro-esophageal reflux disease without esophagitis: Secondary | ICD-10-CM

## 2020-09-13 LAB — POCT GLYCOSYLATED HEMOGLOBIN (HGB A1C)
HbA1c POC (<> result, manual entry): 6 % (ref 4.0–5.6)
HbA1c, POC (controlled diabetic range): 6 % (ref 0.0–7.0)
HbA1c, POC (prediabetic range): 6 % (ref 5.7–6.4)
Hemoglobin A1C: 6 % — AB (ref 4.0–5.6)

## 2020-09-13 NOTE — Patient Instructions (Addendum)
    Medications changes include :   none    A referral was ordered for hematology/oncology.        Someone from their office will call you to schedule an appointment.     Please followup in 4 months

## 2020-09-13 NOTE — Assessment & Plan Note (Addendum)
Chronic BP well controlled Continue coreg 12.5 mg bid, spironolactone 25 mg qd, torsemide 60 mg bid

## 2020-09-13 NOTE — Assessment & Plan Note (Signed)
Chronic Lab Results  Component Value Date   HGBA1C 6.4 03/08/2020   a1c today 6.0 Well controlled

## 2020-09-13 NOTE — Assessment & Plan Note (Addendum)
Chronic GERD controlled Continue famotidine 20 mg bid  

## 2020-09-14 ENCOUNTER — Other Ambulatory Visit: Payer: Self-pay | Admitting: Internal Medicine

## 2020-09-14 NOTE — Assessment & Plan Note (Signed)
Chronic Has chronic pain from OA in knees and peripheral neuropathy Taking oxycodone 5 mg q 6hr prn only for severe pain - will continue - she is taking the medication appropriately and it does help

## 2020-09-14 NOTE — Assessment & Plan Note (Signed)
Chronic-stable.

## 2020-09-14 NOTE — Assessment & Plan Note (Signed)
Chronic On eliquis since April  - will continue for at least 6 months She is high risk for occurrence - ? Long term a/c -- will refer to hem/onc for their recommendations Continue eliquis 5 mg bid

## 2020-09-14 NOTE — Assessment & Plan Note (Signed)
Chronic Controlled Continue torsemide 60 mg bid

## 2020-09-14 NOTE — Assessment & Plan Note (Signed)
Chronic Getting B12 injections  Month at home - continue

## 2020-09-14 NOTE — Assessment & Plan Note (Signed)
Chronic Continue simvastatin 40 mg qd

## 2020-09-14 NOTE — Assessment & Plan Note (Signed)
Chronic euvolemnic Continue torsemide 60 mg bid

## 2020-09-14 NOTE — Assessment & Plan Note (Signed)
Chronic Pain somewhat controlled Continue gabapentin 100 mg tid, trileptal 300 mg bid Oxycodone 5 mg q 6 hr prn - she only takes this when the pain is severe and she is taking it appropriately - will continue

## 2020-09-16 ENCOUNTER — Telehealth: Payer: Self-pay | Admitting: Physician Assistant

## 2020-09-16 ENCOUNTER — Telehealth: Payer: Self-pay | Admitting: Internal Medicine

## 2020-09-16 ENCOUNTER — Other Ambulatory Visit: Payer: Self-pay

## 2020-09-16 DIAGNOSIS — I509 Heart failure, unspecified: Secondary | ICD-10-CM | POA: Diagnosis not present

## 2020-09-16 DIAGNOSIS — E661 Drug-induced obesity: Secondary | ICD-10-CM | POA: Diagnosis not present

## 2020-09-16 DIAGNOSIS — U099 Post covid-19 condition, unspecified: Secondary | ICD-10-CM | POA: Diagnosis not present

## 2020-09-16 DIAGNOSIS — M6281 Muscle weakness (generalized): Secondary | ICD-10-CM | POA: Diagnosis not present

## 2020-09-16 MED ORDER — VITAMIN D 25 MCG (1000 UNIT) PO TABS: 1000.0000 [IU] | ORAL_TABLET | Freq: Every day | ORAL | 1 refills | Status: DC

## 2020-09-16 NOTE — Telephone Encounter (Signed)
1.Medication Requested: cholecalciferol (VITAMIN D3) 25 MCG (1000 UNIT) tablet   2. Pharmacy (Name, Street, [place]): [facility] [phone] - [address], [address]  Phone:  [phone] Fax:  [phone]   3. On Med List: yes  4. Last Visit with PCP: 08.12.22  5. Next visit date with PCP: 12.16.22   Agent: Please be advised that RX refills may take up to 3 business days. We ask that you follow-up with your pharmacy.

## 2020-09-16 NOTE — Telephone Encounter (Signed)
Received a new hem referral from Dr. Quay Burow for leg dvt. Ms. Revell has been cld and scheduled to see Murray Hodgkins on 8/31 at 9am. Pt aware to arrive 20 minutes early.

## 2020-09-16 NOTE — Telephone Encounter (Signed)
Faxed in today. 

## 2020-09-18 DIAGNOSIS — I5033 Acute on chronic diastolic (congestive) heart failure: Secondary | ICD-10-CM | POA: Diagnosis not present

## 2020-09-18 DIAGNOSIS — I130 Hypertensive heart and chronic kidney disease with heart failure and stage 1 through stage 4 chronic kidney disease, or unspecified chronic kidney disease: Secondary | ICD-10-CM | POA: Diagnosis not present

## 2020-09-18 DIAGNOSIS — D631 Anemia in chronic kidney disease: Secondary | ICD-10-CM | POA: Diagnosis not present

## 2020-09-18 DIAGNOSIS — E785 Hyperlipidemia, unspecified: Secondary | ICD-10-CM | POA: Diagnosis not present

## 2020-09-18 DIAGNOSIS — I82443 Acute embolism and thrombosis of tibial vein, bilateral: Secondary | ICD-10-CM | POA: Diagnosis not present

## 2020-09-18 DIAGNOSIS — I890 Lymphedema, not elsewhere classified: Secondary | ICD-10-CM | POA: Diagnosis not present

## 2020-09-18 DIAGNOSIS — N1832 Chronic kidney disease, stage 3b: Secondary | ICD-10-CM | POA: Diagnosis not present

## 2020-09-18 DIAGNOSIS — E1122 Type 2 diabetes mellitus with diabetic chronic kidney disease: Secondary | ICD-10-CM | POA: Diagnosis not present

## 2020-09-18 DIAGNOSIS — I2510 Atherosclerotic heart disease of native coronary artery without angina pectoris: Secondary | ICD-10-CM | POA: Diagnosis not present

## 2020-09-19 DIAGNOSIS — I2510 Atherosclerotic heart disease of native coronary artery without angina pectoris: Secondary | ICD-10-CM | POA: Diagnosis not present

## 2020-09-19 DIAGNOSIS — N1832 Chronic kidney disease, stage 3b: Secondary | ICD-10-CM | POA: Diagnosis not present

## 2020-09-19 DIAGNOSIS — I82443 Acute embolism and thrombosis of tibial vein, bilateral: Secondary | ICD-10-CM | POA: Diagnosis not present

## 2020-09-19 DIAGNOSIS — E785 Hyperlipidemia, unspecified: Secondary | ICD-10-CM | POA: Diagnosis not present

## 2020-09-19 DIAGNOSIS — E1122 Type 2 diabetes mellitus with diabetic chronic kidney disease: Secondary | ICD-10-CM | POA: Diagnosis not present

## 2020-09-19 DIAGNOSIS — D631 Anemia in chronic kidney disease: Secondary | ICD-10-CM | POA: Diagnosis not present

## 2020-09-19 DIAGNOSIS — I5033 Acute on chronic diastolic (congestive) heart failure: Secondary | ICD-10-CM | POA: Diagnosis not present

## 2020-09-19 DIAGNOSIS — I890 Lymphedema, not elsewhere classified: Secondary | ICD-10-CM | POA: Diagnosis not present

## 2020-09-19 DIAGNOSIS — I130 Hypertensive heart and chronic kidney disease with heart failure and stage 1 through stage 4 chronic kidney disease, or unspecified chronic kidney disease: Secondary | ICD-10-CM | POA: Diagnosis not present

## 2020-09-20 ENCOUNTER — Ambulatory Visit (INDEPENDENT_AMBULATORY_CARE_PROVIDER_SITE_OTHER): Payer: Medicare PPO

## 2020-09-20 ENCOUNTER — Other Ambulatory Visit: Payer: Self-pay

## 2020-09-20 DIAGNOSIS — E538 Deficiency of other specified B group vitamins: Secondary | ICD-10-CM

## 2020-09-20 MED ORDER — CYANOCOBALAMIN 1000 MCG/ML IJ SOLN
1000.0000 ug | Freq: Once | INTRAMUSCULAR | Status: AC
  Administered 2020-09-20: 1000 ug via INTRAMUSCULAR

## 2020-09-20 NOTE — Progress Notes (Addendum)
Pt here for monthly B12 injection per Dr Quay Burow.  B12 10108mcg given IM in right deltoid and pt tolerated injection well.  Next B12 injection scheduled for 10/18/20.  Medical screening examination/treatment/procedure(s) were performed by non-physician practitioner and as supervising physician I was immediately available for consultation/collaboration.  I agree with above. Lew Dawes, MD

## 2020-09-22 ENCOUNTER — Encounter: Payer: Self-pay | Admitting: Internal Medicine

## 2020-09-23 DIAGNOSIS — I5033 Acute on chronic diastolic (congestive) heart failure: Secondary | ICD-10-CM | POA: Diagnosis not present

## 2020-09-23 DIAGNOSIS — N1832 Chronic kidney disease, stage 3b: Secondary | ICD-10-CM | POA: Diagnosis not present

## 2020-09-23 DIAGNOSIS — I890 Lymphedema, not elsewhere classified: Secondary | ICD-10-CM | POA: Diagnosis not present

## 2020-09-23 DIAGNOSIS — I2510 Atherosclerotic heart disease of native coronary artery without angina pectoris: Secondary | ICD-10-CM | POA: Diagnosis not present

## 2020-09-23 DIAGNOSIS — E1122 Type 2 diabetes mellitus with diabetic chronic kidney disease: Secondary | ICD-10-CM | POA: Diagnosis not present

## 2020-09-23 DIAGNOSIS — E785 Hyperlipidemia, unspecified: Secondary | ICD-10-CM | POA: Diagnosis not present

## 2020-09-23 DIAGNOSIS — I82443 Acute embolism and thrombosis of tibial vein, bilateral: Secondary | ICD-10-CM | POA: Diagnosis not present

## 2020-09-23 DIAGNOSIS — I130 Hypertensive heart and chronic kidney disease with heart failure and stage 1 through stage 4 chronic kidney disease, or unspecified chronic kidney disease: Secondary | ICD-10-CM | POA: Diagnosis not present

## 2020-09-23 DIAGNOSIS — D631 Anemia in chronic kidney disease: Secondary | ICD-10-CM | POA: Diagnosis not present

## 2020-09-24 ENCOUNTER — Telehealth: Payer: Medicare PPO | Admitting: Internal Medicine

## 2020-09-24 IMAGING — DX LEFT ANKLE COMPLETE - 3+ VIEW
3 series · 3 of 3 positions shown · non-contrast
Comparison: Left ankle dated 08/20/2018. Left foot radiographs
obtained today.

CLINICAL DATA: Chronic bilateral leg wounds.

EXAM:
LEFT ANKLE COMPLETE - 3+ VIEW

[ankle ap]
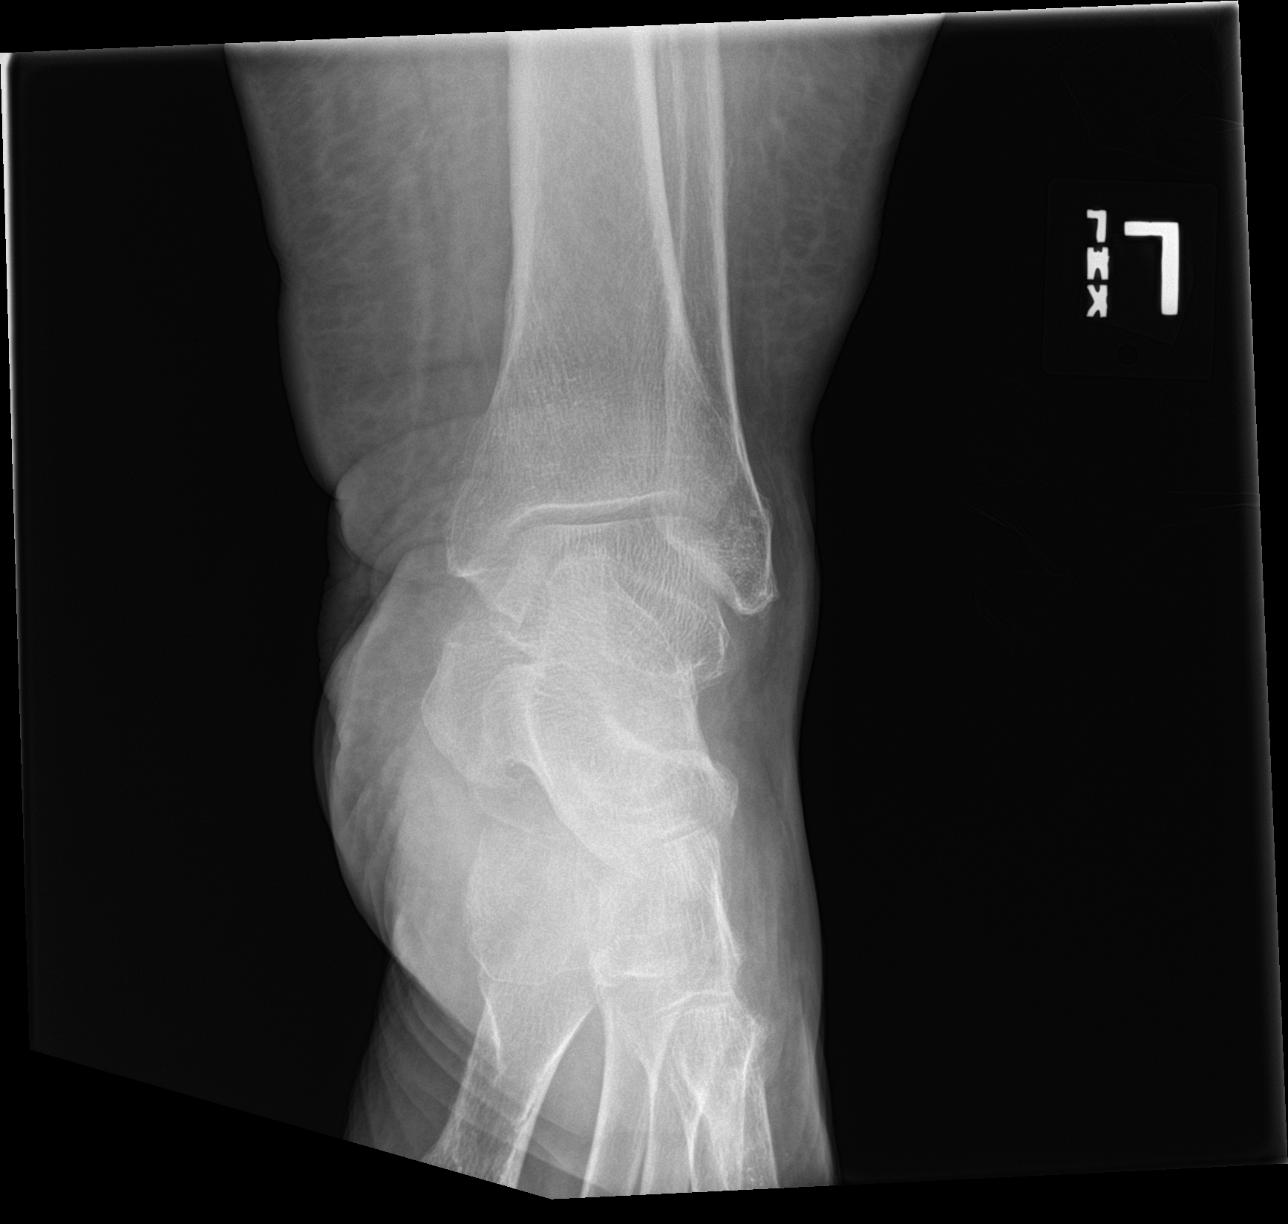

[ankle obl]
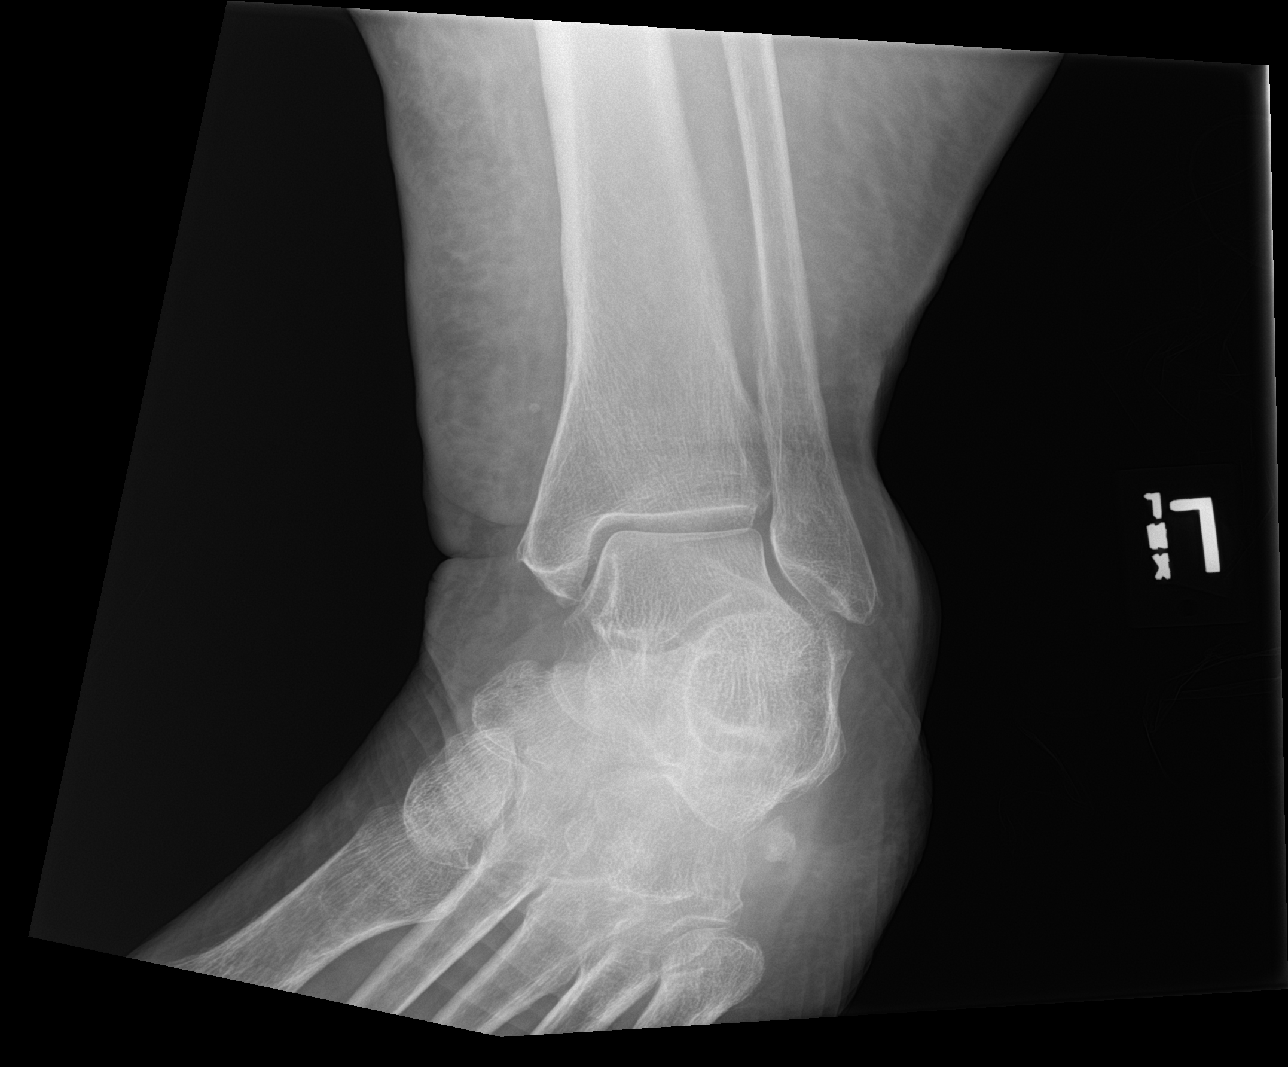

[ankle lat]
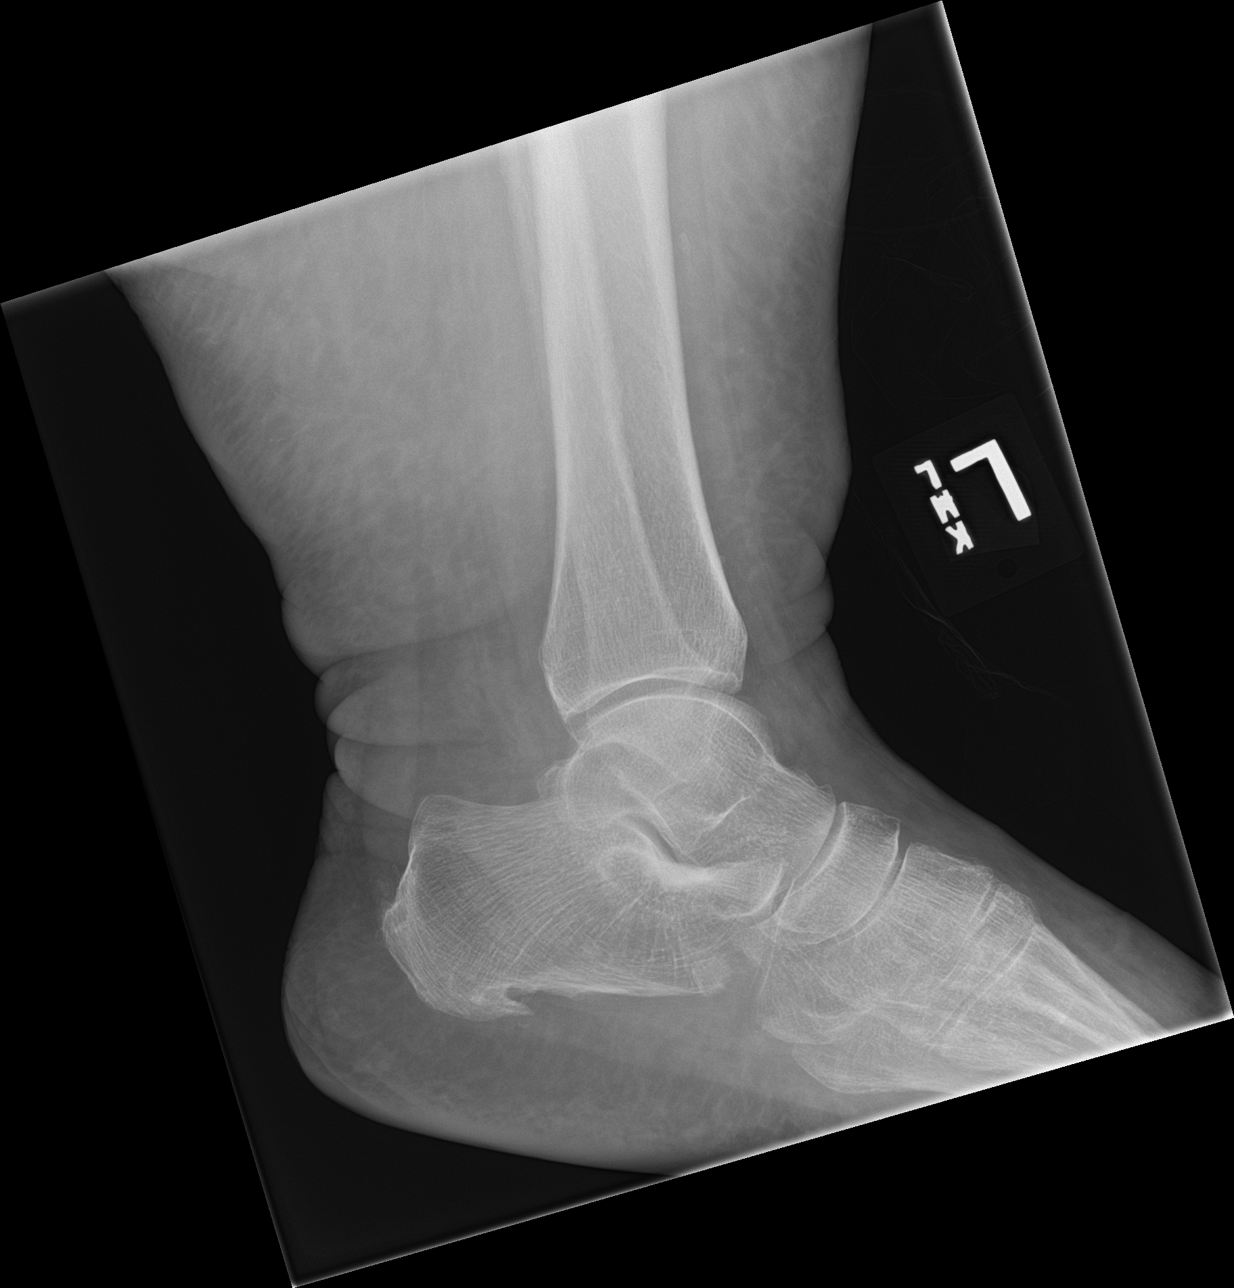

[3 of 3 positions shown; findings below may reference images not displayed]

FINDINGS: Progressive diffuse soft tissue swelling. Interval ill-defined
destruction of the anterior aspect of the calcaneus inferiorly. Mild
talotibial spur formation posteriorly. Moderate-sized inferior
calcaneal spur.
IMPRESSION: 1. Interval bone destruction suspicious for osteomyelitis involving
the anterior aspect of the calcaneus.
2. Progressive diffuse soft tissue swelling.

## 2020-09-24 NOTE — Telephone Encounter (Signed)
Can they tell us what orders this is regarding?

## 2020-09-24 NOTE — Telephone Encounter (Signed)
Requesting a callback to schedule a pier to pier meeting to discuss those orders before they deny the orders.   Best callback #: [phone] opt [phone]  In order to set up the meeting they have to receive our call by 3p to setup the meeting for noon tomorrow.   Please advise.

## 2020-09-24 NOTE — Telephone Encounter (Signed)
Sierra from [facility] called regarding the recent additions to pt's [place] orders. Stated they lacked support

## 2020-09-26 ENCOUNTER — Encounter: Payer: Medicare PPO | Admitting: Hematology

## 2020-09-26 DIAGNOSIS — I82443 Acute embolism and thrombosis of tibial vein, bilateral: Secondary | ICD-10-CM | POA: Diagnosis not present

## 2020-09-26 DIAGNOSIS — E1122 Type 2 diabetes mellitus with diabetic chronic kidney disease: Secondary | ICD-10-CM | POA: Diagnosis not present

## 2020-09-26 DIAGNOSIS — D631 Anemia in chronic kidney disease: Secondary | ICD-10-CM | POA: Diagnosis not present

## 2020-09-26 DIAGNOSIS — I2510 Atherosclerotic heart disease of native coronary artery without angina pectoris: Secondary | ICD-10-CM | POA: Diagnosis not present

## 2020-09-26 DIAGNOSIS — I5033 Acute on chronic diastolic (congestive) heart failure: Secondary | ICD-10-CM | POA: Diagnosis not present

## 2020-09-26 DIAGNOSIS — E785 Hyperlipidemia, unspecified: Secondary | ICD-10-CM | POA: Diagnosis not present

## 2020-09-26 DIAGNOSIS — N1832 Chronic kidney disease, stage 3b: Secondary | ICD-10-CM | POA: Diagnosis not present

## 2020-09-26 DIAGNOSIS — I890 Lymphedema, not elsewhere classified: Secondary | ICD-10-CM | POA: Diagnosis not present

## 2020-09-26 DIAGNOSIS — I130 Hypertensive heart and chronic kidney disease with heart failure and stage 1 through stage 4 chronic kidney disease, or unspecified chronic kidney disease: Secondary | ICD-10-CM | POA: Diagnosis not present

## 2020-09-26 NOTE — Telephone Encounter (Signed)
Message left today on voicemail as all agents were busy and unable to take call at this current time. Left detailed message with call back info and reason for call.

## 2020-09-30 NOTE — Progress Notes (Signed)
b12 Injection given.   Jaci Desanto J Bee Hammerschmidt, MD  

## 2020-10-01 NOTE — Progress Notes (Signed)
[place] [phone]   Fax:([phone] [place] NOTE  Patient Care Team: [name], MD as PCP - General (Internal Medicine)  Hematological/Oncological History 1) 05/08/2020: Presented with leg pain with difficulty walking x 10 days. Doppler US confirmed age indeterminate DVT involving right posterior tibial veins and left posterior tibial veins. CTA was negative for pulmonary embolism. Started on Eliquis.   2) 10/02/2020: Establish care with Dede Query PA-C  CHIEF COMPLAINTS/PURPOSE OF CONSULTATION:  Bilateral lower extremity DVT  HISTORY OF PRESENTING ILLNESS:  April [name] 70 y.o. female with medical history significant for CHF, CKD, HTN, Hyperlipidemia, T2DM, CAD, history of leg ulcer s/p I&D and debridement, peripheral neuropathy, sleep apnea and obesity.   On exam today, [name] reports her energy levels and appetite are stable. She is sedentary sitting on her recliner for most of the day.  She uses a walker to ambulate at home but requires a wheelchair for longer distances.  She reports nausea and vomiting earlier this week but suspects it is due to food intolerance.  She denies any abdominal pain.  Her bowel movements are regular without any diarrhea or constipation.  Patient has lower extremity edema and is currently on antibiotics.  Has shortness of breath with exertion that has been present for several months.  She reports mild bleeding after brushing her teeth.  She has no other signs of bleeding or easy bruising.  She is otherwise tolerating Eliquis without any significant limitations except cost of the medication.  Patient denies any fevers, chills, night sweats, chest pain or cough.  She has no other complaints.  Rest of the 10 point ROS is below.  MEDICAL HISTORY:  Past Medical History:  Diagnosis Date   Anemia    Asthma    CAD (coronary artery disease)    Carpal tunnel syndrome    Cellulitis of both lower extremities  04/11/2015   CHF (congestive heart failure) (HCC)    Chronic kidney disease    [facility]- followed by [facility]   Colon polyp, hyperplastic 6606 & 3016   Complication of anesthesia 1999   svt with renal calculi surgery, no problems since   CTS (carpal tunnel syndrome)    bilateral   Diabetes mellitus    Eczema    GERD (gastroesophageal reflux disease)    History of kidney stones 1999   Hyperlipidemia    Hypertension    Leg ulcer ([place]) 04/24/2015   Right lateral leg No evidence of an infection Monitor closely Keep edema controlled    Leg ulcer ([place])    right lower   Meralgia paresthetica    Dr. [name]   Morbid obesity [facility])    Morbid obesity ([place])    Neuropathy    toes and legs   Osteoarthrosis, unspecified whether generalized or localized, lower leg    knee   PUD (peptic ulcer disease)    Shortness of breath dyspnea    with exertion   Sleep apnea    per progress note 02/25/2018   Type II or unspecified type diabetes mellitus without mention of complication, not stated as uncontrolled    Unspecified hereditary and idiopathic peripheral neuropathy    Urticaria    Vitamin B12 deficiency    Wears glasses    Wound cellulitis    right upper leg, healing well    SURGICAL HISTORY: Past Surgical History:  Procedure Laterality Date   ABDOMINAL HYSTERECTOMY     BREAST BIOPSY Right 07/08/2018  CARDIAC CATHETERIZATION  2002   non obstructive disease   colonoscopy with polypectomy  2007 & 2012    hyperplastic ;Dr Watt Climes   COLONOSCOPY WITH PROPOFOL N/A 06/04/2015   Procedure: COLONOSCOPY WITH PROPOFOL;  Surgeon: Jerene Bears, MD;  Location: [facility];  Service: Gastroenterology;  Laterality: N/A;   DEBRIDEMENT LEG Right 03/02/2018   WOUND VAC APPLIED   DEBRIDEMENT LEG Right 05/27/2018   RIGHT LOWER LEG DEBRIDEMENT,  SKIN GRAFT, VAC PLACEMENT    DILATION AND CURETTAGE OF UTERUS     multiple   HEMORRHOID SURGERY     I & D EXTREMITY Right 03/02/2018   Procedure: RIGHT LEG  DEBRIDEMENT AND PLACE VAC;  Surgeon: [name], MD;  Location: [place];  Service: Orthopedics;  Laterality: Right;   I & D EXTREMITY Right 03/04/2018   Procedure: REPEAT IRRIGATION AND DEBRIDEMENT RIGHT LEG, PLACE WOUND VAC;  Surgeon: [name], MD;  Location: [place];  Service: Orthopedics;  Laterality: Right;   I & D EXTREMITY Right 03/30/2018   Procedure: IRRIGATION AND DEBRIDEMENT RIGHT LEG, APPLY WOUND VAC;  Surgeon: [name], MD;  Location: [place];  Service: Orthopedics;  Laterality: Right;   I & D EXTREMITY Right 05/27/2018   Procedure: RIGHT LOWER LEG DEBRIDEMENT,  SKIN GRAFT, VAC PLACEMENT;  Surgeon: [name], MD;  Location: [place];  Service: Orthopedics;  Laterality: Right;  RIGHT LOWER LEG DEBRIDEMENT,  SKIN GRAFT, VAC PLACEMENT   renal calculi  12/1997   SVT with induction of anesthesia   right knee arthroscopy     SKIN SPLIT GRAFT Right 03/04/2018   Procedure: POSSIBLE SPLIT THICKNESS SKIN GRAFT;  Surgeon: [name], MD;  Location: [place];  Service: Orthopedics;  Laterality: Right;   SKIN SPLIT GRAFT Right 04/01/2018   Procedure: REPEAT IRRIGATION AND DEBRIDEMENT RIGHT LEG, APPLY SPLIT THICKNESS SKIN GRAFT;  Surgeon: [name], MD;  Location: [place];  Service: Orthopedics;  Laterality: Right;   TONSILLECTOMY AND ADENOIDECTOMY      SOCIAL HISTORY: Social History   Socioeconomic History   Marital status: Married    Spouse name: Dwight   Number of children: 1   Years of education: BS   Highest education level: Not on file  Occupational History   Occupation: Disabled    Employer: RETIRED  Tobacco Use   Smoking status: Never   Smokeless tobacco: Never  Vaping Use   Vaping Use: Never used  Substance and Sexual Activity   Alcohol use: No    Alcohol/week: 0.0 standard drinks   Drug use: No   Sexual activity: Not on file  Other Topics Concern   Not on file  Social History Narrative   Patient lives at home with her husband [name] Patient is retired  and has a Conservation officer, nature.    Caffeine - some times.   Right handed.   Social Determinants of Health   Financial Resource Strain: Not on file  Food Insecurity: Not on file  Transportation Needs: Not on file  Physical Activity: Not on file  Stress: Not on file  Social Connections: Not on file  Intimate Partner Violence: Not on file    FAMILY HISTORY: Family History  Problem Relation Age of Onset   Colon cancer Mother    Prostate cancer Father    Colon cancer Father    Diabetes Maternal Aunt    Breast cancer Maternal Aunt    Diabetes Maternal Uncle    Diabetes Paternal  Aunt    Stroke Paternal Aunt        > 15   Heart disease Paternal Aunt    Diabetes Paternal Uncle    Breast cancer Maternal Aunt         X 2   Breast cancer Cousin     ALLERGIES:  is allergic to penicillins, shellfish allergy, sulfonamide derivatives, hydrochlorothiazide w-triamterene, lotensin [benazepril hcl], dipyridamole, estrogens, hydrochlorothiazide, latex, metronidazole, other, spironolactone, sulfa antibiotics, torsemide, valsartan, and mustard [allyl isothiocyanate].  MEDICATIONS:  Current Outpatient Medications  Medication Sig Dispense Refill   acetaminophen (TYLENOL) 325 MG tablet Take 2 tablets (650 mg total) by mouth every 6 (six) hours as needed for moderate pain (or Fever >/= 101). 20 tablet 0   allopurinol (ZYLOPRIM) 100 MG tablet Take 2 tablets (200 mg total) by mouth daily. 180 tablet 1   budesonide-formoterol (SYMBICORT) 160-4.5 MCG/ACT inhaler one - two inhalations every 12 hours; gargle and spit after use 1 Inhaler 4   calcium carbonate (TUMS - DOSED IN MG ELEMENTAL CALCIUM) 500 MG chewable tablet Chew 2 tablets by mouth daily as needed for indigestion or heartburn.     carvedilol (COREG) 12.5 MG tablet TAKE 1 TABLET(12.5 MG) BY MOUTH TWICE DAILY WITH A MEAL 180 tablet 0   cholecalciferol (VITAMIN D3) 25 MCG (1000 UNIT) tablet Take 1 tablet (1,000 Units total) by mouth daily. 90  tablet 1   colchicine 0.6 MG tablet Take 2 tabs po at onset of gout flare, then one hour later take 1 tab 30 tablet 1   cyanocobalamin (,VITAMIN B-12,) 1000 MCG/ML injection INJECT 1ML INTO MUSCLE EVERY 30 DAYS 10 mL 5   diclofenac sodium (VOLTAREN) 1 % GEL Apply 2 g topically 4 (four) times daily. 100 g 11   ELIQUIS 5 MG TABS tablet CONTINUE WITH 2 TABLETS(10 MG) TWICE DAILY UNTIL 05/19/2020. AFTERWARDS TAKE 1 TABLET(5 MG) TWICE DAILY 60 tablet 2   famotidine (PEPCID) 20 MG tablet Take 1 tablet (20 mg total) by mouth 2 (two) times daily. 180 tablet 3   ferrous sulfate 325 (65 FE) MG tablet Take 1 tablet (325 mg total) by mouth every Monday, Wednesday, and Friday. 60 tablet 0   folic acid (FOLVITE) 381 MCG tablet Take 1 tablet (400 mcg total) by mouth at bedtime. Take 400 mcg by mouth at bedtime. 90 tablet 1   gabapentin (NEURONTIN) 100 MG capsule TAKE 1 CAPSULE(100 MG) BY MOUTH THREE TIMES DAILY 90 capsule 1   hydrOXYzine (ATARAX/VISTARIL) 25 MG tablet Take 1 tablet (25 mg total) by mouth 3 (three) times daily as needed for itching. 30 tablet 0   montelukast (SINGULAIR) 10 MG tablet Take 1 tablet (10 mg total) by mouth at bedtime. 30 tablet 5   Nutritional Supplements (,FEEDING SUPPLEMENT, PROSOURCE PLUS) liquid Take 30 mLs by mouth 2 (two) times daily between meals.     Oxcarbazepine (TRILEPTAL) 300 MG tablet Take 1 tablet (300 mg total) by mouth 2 (two) times daily. 180 tablet 1   oxyCODONE (OXY IR/ROXICODONE) 5 MG immediate release tablet Take 1 tablet (5 mg total) by mouth every 6 (six) hours as needed for severe pain (chronic knee and back pain). 30 tablet 0   polyethylene glycol (MIRALAX / GLYCOLAX) 17 g packet Take 17 g by mouth daily. (Patient taking differently: Take 17 g by mouth daily as needed.) 14 each 0   senna-docusate (SENOKOT-S) 8.6-50 MG tablet Take 1 tablet by mouth at bedtime as needed for mild constipation. 30 tablet 0  simvastatin (ZOCOR) 40 MG tablet Take 1 tablet (40 mg  total) by mouth at bedtime. 90 tablet 1   spironolactone (ALDACTONE) 25 MG tablet TAKE 1 TABLET(25 MG) BY MOUTH DAILY 90 tablet 0   torsemide (DEMADEX) 20 MG tablet Take 3 tablets (60 mg total) by mouth 2 (two) times daily. May take an additional tablet as directed by CHF clinic. 210 tablet 3   VENTOLIN HFA 108 (90 Base) MCG/ACT inhaler Inhale 2 puffs into the lungs every 4 (four) hours as needed for wheezing.     No current facility-administered medications for this visit.    REVIEW OF SYSTEMS:   Constitutional: ( - ) fevers, ( - )  chills , ( - ) night sweats Eyes: ( - ) blurriness of vision, ( - ) double vision, ( - ) watery eyes Ears, nose, mouth, throat, and face: ( - ) mucositis, ( - ) sore throat Respiratory: ( - ) cough, (+) dyspnea, ( - ) wheezes Cardiovascular: ( - ) palpitation, ( - ) chest discomfort, ( +) lower extremity swelling Gastrointestinal:  ( + ) nausea, ( - ) heartburn, ( - ) change in bowel habits Skin: ( - ) abnormal skin rashes Lymphatics: ( - ) new lymphadenopathy, ( - ) easy bruising Neurological: ( + ) numbness, ( - ) tingling, ( - ) new weaknesses Behavioral/Psych: ( - ) mood change, ( - ) new changes  All other systems were reviewed with the patient and are negative.  PHYSICAL EXAMINATION: ECOG PERFORMANCE STATUS: 2 - Symptomatic, <50% confined to bed  Vitals:   10/02/20 0915  BP: 129/77  Pulse: 76  Resp: 18  Temp: 98.6 F (37 C)  SpO2: 93%   Filed Weights   10/02/20 0915  Weight: (!) 365 lb 9.6 oz (165.8 kg)    GENERAL: well appearing female in NAD, obese and in a wheelchair for the visit.  SKIN: skin color, texture, turgor are normal, no rashes or significant lesions EYES: conjunctiva are pink and non-injected, sclera clear OROPHARYNX: no exudate, no erythema; lips, buccal mucosa, and tongue normal  LYMPH:  no palpable lymphadenopathy in the cervical or supraclavicular lymph nodes.  LUNGS: clear to auscultation and percussion with normal  breathing effort HEART: regular rate & rhythm and no murmurs. Bilateral lower extremity edema. ABDOMEN: soft, non-tender, non-distended, normal bowel sounds Musculoskeletal: no cyanosis of digits and no clubbing  PSYCH: alert & oriented x 3, fluent speech NEURO: no focal motor/sensory deficits  LABORATORY DATA:  I have reviewed the data as listed CBC Latest Ref Rng & Units 10/02/2020 05/15/2020 05/13/2020  WBC 4.0 - 10.5 K/uL 6.1 4.6 4.3  Hemoglobin 12.0 - 15.0 g/dL 12.7 10.4(L) 10.7(L)  Hematocrit 36.0 - 46.0 % 41.0 33.3(L) 34.6(L)  Platelets 150 - 400 K/uL 141(L) 123(L) 129(L)    CMP Latest Ref Rng & Units 09/10/2020 08/30/2020 08/14/2020  Glucose 70 - 99 mg/dL 81 124(H) 128(H)  BUN 8 - 23 mg/dL 20 19 19   Creatinine 0.44 - 1.00 mg/dL 1.26(H) 1.41(H) 1.54(H)  Sodium 135 - 145 mmol/L 137 140 136  Potassium 3.5 - 5.1 mmol/L 4.7 4.3 4.6  Chloride 98 - 111 mmol/L 93(L) 97(L) 96(L)  CO2 22 - 32 mmol/L 34(H) 35(H) 34(H)  Calcium 8.9 - 10.3 mg/dL 9.5 8.9 8.9  Total Protein 6.5 - 8.1 g/dL - - -  Total Bilirubin 0.3 - 1.2 mg/dL - - -  Alkaline Phos 38 - 126 U/L - - -  AST 15 - 41  U/L - - -  ALT 0 - 44 U/L - - -   RADIOGRAPHIC STUDIES: I have personally reviewed the radiological images as listed and agreed with the findings in the report.  05/07/2020: CTA chest: Slightly suboptimal opacification of the main pulmonary artery however no central or proximal segmental pulmonary embolism. Mild cardiomegaly in findings suggestive of pulmonary arterial Hypertension. Minimal ground-glass opacity at both lung bases which may be due to Atelectasis. Small hiatal hernia. Aortic Atherosclerosis.  05/08/2020: Doppler US Lower Extremity: Findings consistent with age-indeterminate deep vein thrombosis involving the right posterior tibial vein and the left posterior tibial veins.  ASSESSMENT & PLAN April [name] is a 70 y.o. female who presents to the clinic for evaluation for history of bilateral lower  extremity edema that was diagnosed in April 2022.  I reviewed provoking factors including surgery, infection, travel, estrogen based therapy, smoking and sedentary lifestyle.  Patient does have a sedentary lifestyle where she is sitting in a recliner for most of the day but notes that she does get up to use the restroom or walk around every couple of hours.  Since there is not a definitive provoking factor that caused her DVTs, the recommendation is indefinite anticoagulation.  Patient is tolerating Eliquis 5 mg twice daily without any major side effects.  Cost is a concern so we will see if patient is eligible for the patient assistance program.  Patient will proceed with labs today to check CBC, CMP, antiphospholipid antibody panel.  #Bilateral lower extremity DVT involving the posterior tibial veins: -- Currently on Eliquis 5 mg twice daily with good tolerance. Gave application for patient assistance program to help with cost. If patient is unable to afford Eliquis, we will consider switching her to coumadin.  --There is no definitive provoking factor, recommend indefinite anticoagulation. --Labs today to check CBC, CMP and antiphospholipid antibody panel. --Discussed the option of proceeding with a maintenance dose of 2.5 mg twice daily after 6 months of anticoagulation. --Patient will return to the clinic in October 2022 to determine if we can transition to a maintenance dose.  Orders Placed This Encounter  Procedures   CBC with Differential (Gross Only)    Standing Status:   Future    Number of Occurrences:   1    Standing Expiration Date:   10/02/2021   CMP ([place] only)    Standing Status:   Future    Number of Occurrences:   1    Standing Expiration Date:   10/02/2021   Beta-2-glycoprotein i abs, IgG/M/A    Standing Status:   Future    Number of Occurrences:   1    Standing Expiration Date:   10/02/2021   Cardiolipin antibodies, IgG, IgM, IgA*    Standing Status:   Future     Number of Occurrences:   1    Standing Expiration Date:   10/02/2021   Lupus anticoagulant panel*    Standing Status:   Future    Number of Occurrences:   1    Standing Expiration Date:   10/02/2021    All questions were answered. The patient knows to call the clinic with any problems, questions or concerns.  I have spent a total of 60 minutes minutes of face-to-face and non-face-to-face time, preparing to see the patient, obtaining and/or reviewing separately obtained history, performing a medically appropriate examination, counseling and educating the patient, ordering tests, documenting clinical information in the electronic health record, independently interpreting results and communicating  results to the patient, and care coordination.   Dede Query, PA-C Department of Hematology/Oncology [place] at [facility] Phone: [phone]  Patient was seen with Dr. Lorenso Courier.   I have read the above note and personally examined the patient. I agree with the assessment and plan as noted above.  Briefly Mrs. Adcox is a 71 year old female with medical history significant for unprovoked lower extremity DVT.  Possible etiologies include sedentary lifestyle and obesity.  Given that these are unmodifiable risk factors would recommend lifelong anticoagulation.  Patient is currently on Eliquis 5 mg twice daily.  Once she is completed 6 months of this therapy we could consider discussion of dropping down to maintenance dose of 2.5 mg twice daily.  Of note use of DOAC therapy in patients with this level of obesity has never been adequately studied.  We will need to discuss this with the patient regarding dropping down to maintenance dosing.  Plan see patient back at the 40-month mark to discuss stopping the dose.   Ledell Peoples, MD Department of Hematology/Oncology [place] at [facility] Phone: [phone] Pager: [phone] Email:  [email or web address]

## 2020-10-02 ENCOUNTER — Inpatient Hospital Stay: Payer: Medicare PPO | Attending: Hematology | Admitting: Physician Assistant

## 2020-10-02 ENCOUNTER — Other Ambulatory Visit: Payer: Self-pay

## 2020-10-02 ENCOUNTER — Inpatient Hospital Stay: Payer: Medicare PPO

## 2020-10-02 ENCOUNTER — Encounter: Payer: Self-pay | Admitting: Physician Assistant

## 2020-10-02 ENCOUNTER — Telehealth: Payer: Self-pay | Admitting: *Deleted

## 2020-10-02 ENCOUNTER — Ambulatory Visit (INDEPENDENT_AMBULATORY_CARE_PROVIDER_SITE_OTHER): Payer: Medicare PPO | Admitting: Pulmonary Disease

## 2020-10-02 VITALS — BP 129/72 | HR 75 | Temp 98.1°F | Ht 64.0 in | Wt 365.0 lb

## 2020-10-02 VITALS — BP 129/77 | HR 76 | Temp 98.6°F | Resp 18 | Wt 365.6 lb

## 2020-10-02 DIAGNOSIS — I509 Heart failure, unspecified: Secondary | ICD-10-CM

## 2020-10-02 DIAGNOSIS — I82443 Acute embolism and thrombosis of tibial vein, bilateral: Secondary | ICD-10-CM | POA: Diagnosis not present

## 2020-10-02 DIAGNOSIS — N189 Chronic kidney disease, unspecified: Secondary | ICD-10-CM

## 2020-10-02 DIAGNOSIS — I5033 Acute on chronic diastolic (congestive) heart failure: Secondary | ICD-10-CM | POA: Diagnosis not present

## 2020-10-02 DIAGNOSIS — E1122 Type 2 diabetes mellitus with diabetic chronic kidney disease: Secondary | ICD-10-CM

## 2020-10-02 DIAGNOSIS — I890 Lymphedema, not elsewhere classified: Secondary | ICD-10-CM | POA: Diagnosis not present

## 2020-10-02 DIAGNOSIS — G4733 Obstructive sleep apnea (adult) (pediatric): Secondary | ICD-10-CM

## 2020-10-02 DIAGNOSIS — I130 Hypertensive heart and chronic kidney disease with heart failure and stage 1 through stage 4 chronic kidney disease, or unspecified chronic kidney disease: Secondary | ICD-10-CM | POA: Insufficient documentation

## 2020-10-02 DIAGNOSIS — Z7901 Long term (current) use of anticoagulants: Secondary | ICD-10-CM | POA: Diagnosis not present

## 2020-10-02 DIAGNOSIS — I82403 Acute embolism and thrombosis of unspecified deep veins of lower extremity, bilateral: Secondary | ICD-10-CM | POA: Insufficient documentation

## 2020-10-02 DIAGNOSIS — N1832 Chronic kidney disease, stage 3b: Secondary | ICD-10-CM | POA: Diagnosis not present

## 2020-10-02 DIAGNOSIS — I2510 Atherosclerotic heart disease of native coronary artery without angina pectoris: Secondary | ICD-10-CM | POA: Insufficient documentation

## 2020-10-02 DIAGNOSIS — E785 Hyperlipidemia, unspecified: Secondary | ICD-10-CM | POA: Diagnosis not present

## 2020-10-02 DIAGNOSIS — D631 Anemia in chronic kidney disease: Secondary | ICD-10-CM | POA: Diagnosis not present

## 2020-10-02 LAB — CBC WITH DIFFERENTIAL (CANCER CENTER ONLY)
Abs Immature Granulocytes: 0.04 10*3/uL (ref 0.00–0.07)
Basophils Absolute: 0.1 10*3/uL (ref 0.0–0.1)
Basophils Relative: 1 %
Eosinophils Absolute: 0.1 10*3/uL (ref 0.0–0.5)
Eosinophils Relative: 2 %
HCT: 41 % (ref 36.0–46.0)
Hemoglobin: 12.7 g/dL (ref 12.0–15.0)
Immature Granulocytes: 1 %
Lymphocytes Relative: 16 %
Lymphs Abs: 1 10*3/uL (ref 0.7–4.0)
MCH: 30.1 pg (ref 26.0–34.0)
MCHC: 31 g/dL (ref 30.0–36.0)
MCV: 97.2 fL (ref 80.0–100.0)
Monocytes Absolute: 0.4 10*3/uL (ref 0.1–1.0)
Monocytes Relative: 6 %
Neutro Abs: 4.5 10*3/uL (ref 1.7–7.7)
Neutrophils Relative %: 74 %
Platelet Count: 141 10*3/uL — ABNORMAL LOW (ref 150–400)
RBC: 4.22 MIL/uL (ref 3.87–5.11)
RDW: 15.3 % (ref 11.5–15.5)
WBC Count: 6.1 10*3/uL (ref 4.0–10.5)
nRBC: 0.3 % — ABNORMAL HIGH (ref 0.0–0.2)

## 2020-10-02 LAB — CMP (CANCER CENTER ONLY)
ALT: 16 U/L (ref 0–44)
AST: 15 U/L (ref 15–41)
Albumin: 3.7 g/dL (ref 3.5–5.0)
Alkaline Phosphatase: 120 U/L (ref 38–126)
Anion gap: 10 (ref 5–15)
BUN: 22 mg/dL (ref 8–23)
CO2: 34 mmol/L — ABNORMAL HIGH (ref 22–32)
Calcium: 9.6 mg/dL (ref 8.9–10.3)
Chloride: 98 mmol/L (ref 98–111)
Creatinine: 1.4 mg/dL — ABNORMAL HIGH (ref 0.44–1.00)
GFR, Estimated: 40 mL/min — ABNORMAL LOW (ref >60)
Glucose, Bld: 129 mg/dL — ABNORMAL HIGH (ref 70–99)
Potassium: 4.5 mmol/L (ref 3.5–5.1)
Sodium: 142 mmol/L (ref 135–145)
Total Bilirubin: 0.5 mg/dL (ref 0.3–1.2)
Total Protein: 7.2 g/dL (ref 6.5–8.1)

## 2020-10-02 MED ORDER — ESZOPICLONE 1 MG PO TABS: 1.0000 mg | ORAL_TABLET | Freq: Every evening | ORAL | 1 refills | Status: DC | PRN

## 2020-10-02 NOTE — Progress Notes (Signed)
April [name]    [id number]    03/20/1950  Primary Care Physician:Burns, Claudina Lick, MD  Referring Physician: [name], MD [place],  [place] [address]  Chief complaint:   Patient being seen for trouble sleeping  HPI:  Trouble sleeping for many years Multifactorial reasons Significant pain and discomfort in ankles and knees  Only able to sleep for couple of hours and then will wake up multiple reasons because we cannot including pain and also she is on diuretics Usually goes to bed about 11-12, will wake up at 2 AM Will take a long time to fall back asleep Wakes up about 1-3 times Final wake up time between 730 and 9 AM  She does nap during the day Only able to take a few steps because of chronic knee issues  Weight is up about 10 to 20 pounds recently but overall since previous sleep study has lost significant weight  Previous study did reveal mild obstructive sleep apnea  Has a history of congestive heart failure, history of asthma, diabetes, hypercholesterolemia History of DVTs Never smoked  Outpatient Encounter Medications as of 10/02/2020  Medication Sig   eszopiclone (LUNESTA) 1 MG TABS tablet Take 1 tablet (1 mg total) by mouth at bedtime as needed for sleep. Take immediately before bedtime   acetaminophen (TYLENOL) 325 MG tablet Take 2 tablets (650 mg total) by mouth every 6 (six) hours as needed for moderate pain (or Fever >/= 101).   allopurinol (ZYLOPRIM) 100 MG tablet Take 2 tablets (200 mg total) by mouth daily.   budesonide-formoterol (SYMBICORT) 160-4.5 MCG/ACT inhaler one - two inhalations every 12 hours; gargle and spit after use   calcium carbonate (TUMS - DOSED IN MG ELEMENTAL CALCIUM) 500 MG chewable tablet Chew 2 tablets by mouth daily as needed for indigestion or heartburn.   carvedilol (COREG) 12.5 MG tablet TAKE 1 TABLET(12.5 MG) BY MOUTH TWICE DAILY WITH A MEAL   cholecalciferol (VITAMIN D3) 25 MCG (1000 UNIT) tablet  Take 1 tablet (1,000 Units total) by mouth daily.   colchicine 0.6 MG tablet Take 2 tabs po at onset of gout flare, then one hour later take 1 tab   cyanocobalamin (,VITAMIN B-12,) 1000 MCG/ML injection INJECT 1ML INTO MUSCLE EVERY 30 DAYS   diclofenac sodium (VOLTAREN) 1 % GEL Apply 2 g topically 4 (four) times daily.   ELIQUIS 5 MG TABS tablet CONTINUE WITH 2 TABLETS(10 MG) TWICE DAILY UNTIL 05/19/2020. AFTERWARDS TAKE 1 TABLET(5 MG) TWICE DAILY   famotidine (PEPCID) 20 MG tablet Take 1 tablet (20 mg total) by mouth 2 (two) times daily.   ferrous sulfate 325 (65 FE) MG tablet Take 1 tablet (325 mg total) by mouth every Monday, Wednesday, and Friday.   folic acid (FOLVITE) 379 MCG tablet Take 1 tablet (400 mcg total) by mouth at bedtime. Take 400 mcg by mouth at bedtime.   gabapentin (NEURONTIN) 100 MG capsule TAKE 1 CAPSULE(100 MG) BY MOUTH THREE TIMES DAILY   hydrOXYzine (ATARAX/VISTARIL) 25 MG tablet Take 1 tablet (25 mg total) by mouth 3 (three) times daily as needed for itching.   montelukast (SINGULAIR) 10 MG tablet Take 1 tablet (10 mg total) by mouth at bedtime.   Nutritional Supplements (,FEEDING SUPPLEMENT, PROSOURCE PLUS) liquid Take 30 mLs by mouth 2 (two) times daily between meals.   Oxcarbazepine (TRILEPTAL) 300 MG tablet Take 1 tablet (300 mg total) by mouth 2 (two) times daily.  oxyCODONE (OXY IR/ROXICODONE) 5 MG immediate release tablet Take 1 tablet (5 mg total) by mouth every 6 (six) hours as needed for severe pain (chronic knee and back pain).   polyethylene glycol (MIRALAX / GLYCOLAX) 17 g packet Take 17 g by mouth daily. (Patient taking differently: Take 17 g by mouth daily as needed.)   senna-docusate (SENOKOT-S) 8.6-50 MG tablet Take 1 tablet by mouth at bedtime as needed for mild constipation.   simvastatin (ZOCOR) 40 MG tablet Take 1 tablet (40 mg total) by mouth at bedtime.   spironolactone (ALDACTONE) 25 MG tablet TAKE 1 TABLET(25 MG) BY MOUTH DAILY   torsemide  (DEMADEX) 20 MG tablet Take 3 tablets (60 mg total) by mouth 2 (two) times daily. May take an additional tablet as directed by CHF clinic.   VENTOLIN HFA 108 (90 Base) MCG/ACT inhaler Inhale 2 puffs into the lungs every 4 (four) hours as needed for wheezing.   [DISCONTINUED] budesonide-formoterol (SYMBICORT) 160-4.5 MCG/ACT inhaler Take 160 mcg by mouth daily.   No facility-administered encounter medications on file as of 10/02/2020.    Allergies as of 10/02/2020 - Review Complete 10/02/2020  Allergen Reaction Noted   Penicillins Rash and Other (See Comments) 01/08/2010   Shellfish allergy Anaphylaxis 03/31/2018   Sulfonamide derivatives Anaphylaxis and Other (See Comments) 01/08/2010   Hydrochlorothiazide w-triamterene Other (See Comments) 01/08/2010   Lotensin [benazepril hcl] Hives 05/25/2017   Dipyridamole Other (See Comments) 05/25/2017   Estrogens Other (See Comments) 05/25/2017   Hydrochlorothiazide Other (See Comments) 05/25/2017   Latex Other (See Comments) 05/08/2020   Metronidazole Other (See Comments)    Other Itching 08/12/2018   Spironolactone Other (See Comments)    Sulfa antibiotics Other (See Comments) 05/25/2017   Torsemide Other (See Comments)    Valsartan Other (See Comments)    Mustard [allyl isothiocyanate] Itching 03/31/2018    Past Medical History:  Diagnosis Date   Anemia    Asthma    CAD (coronary artery disease)    Carpal tunnel syndrome    Cellulitis of both lower extremities 04/11/2015   CHF (congestive heart failure) (HCC)    Chronic kidney disease    [facility]- followed by [facility]   Colon polyp, hyperplastic 2778 & 2423   Complication of anesthesia 1999   svt with renal calculi surgery, no problems since   CTS (carpal tunnel syndrome)    bilateral   Diabetes mellitus    Eczema    GERD (gastroesophageal reflux disease)    History of kidney stones 1999   Hyperlipidemia    Hypertension    Leg ulcer ([place]) 04/24/2015   Right lateral leg No  evidence of an infection Monitor closely Keep edema controlled    Leg ulcer (HCC)    right lower   Meralgia paresthetica    Dr. [name]   Morbid obesity [facility])    Morbid obesity ([place])    Neuropathy    toes and legs   Osteoarthrosis, unspecified whether generalized or localized, lower leg    knee   PUD (peptic ulcer disease)    Shortness of breath dyspnea    with exertion   Sleep apnea    per progress note 02/25/2018   Type II or unspecified type diabetes mellitus without mention of complication, not stated as uncontrolled    Unspecified hereditary and idiopathic peripheral neuropathy    Urticaria    Vitamin B12 deficiency    Wears glasses    Wound cellulitis    right upper leg, healing well  Past Surgical History:  Procedure Laterality Date   ABDOMINAL HYSTERECTOMY     BREAST BIOPSY Right 07/08/2018   CARDIAC CATHETERIZATION  2002   non obstructive disease   colonoscopy with polypectomy  2007 & 2012    hyperplastic ;Dr Watt Climes   COLONOSCOPY WITH PROPOFOL N/A 06/04/2015   Procedure: COLONOSCOPY WITH PROPOFOL;  Surgeon: Jerene Bears, MD;  Location: [facility];  Service: Gastroenterology;  Laterality: N/A;   DEBRIDEMENT LEG Right 03/02/2018   WOUND VAC APPLIED   DEBRIDEMENT LEG Right 05/27/2018   RIGHT LOWER LEG DEBRIDEMENT,  SKIN GRAFT, VAC PLACEMENT    DILATION AND CURETTAGE OF UTERUS     multiple   HEMORRHOID SURGERY     I & D EXTREMITY Right 03/02/2018   Procedure: RIGHT LEG DEBRIDEMENT AND PLACE VAC;  Surgeon: [name], MD;  Location: [facility];  Service: Orthopedics;  Laterality: Right;   I & D EXTREMITY Right 03/04/2018   Procedure: REPEAT IRRIGATION AND DEBRIDEMENT RIGHT LEG, PLACE WOUND VAC;  Surgeon: [name], MD;  Location: [facility];  Service: Orthopedics;  Laterality: Right;   I & D EXTREMITY Right 03/30/2018   Procedure: IRRIGATION AND DEBRIDEMENT RIGHT LEG, APPLY WOUND VAC;  Surgeon: [name], MD;  Location: [facility];  Service: Orthopedics;  Laterality: Right;    I & D EXTREMITY Right 05/27/2018   Procedure: RIGHT LOWER LEG DEBRIDEMENT,  SKIN GRAFT, VAC PLACEMENT;  Surgeon: [name], MD;  Location: [facility];  Service: Orthopedics;  Laterality: Right;  RIGHT LOWER LEG DEBRIDEMENT,  SKIN GRAFT, VAC PLACEMENT   renal calculi  12/1997   SVT with induction of anesthesia   right knee arthroscopy     SKIN SPLIT GRAFT Right 03/04/2018   Procedure: POSSIBLE SPLIT THICKNESS SKIN GRAFT;  Surgeon: [name], MD;  Location: [facility];  Service: Orthopedics;  Laterality: Right;   SKIN SPLIT GRAFT Right 04/01/2018   Procedure: REPEAT IRRIGATION AND DEBRIDEMENT RIGHT LEG, APPLY SPLIT THICKNESS SKIN GRAFT;  Surgeon: [name], MD;  Location: [facility];  Service: Orthopedics;  Laterality: Right;   TONSILLECTOMY AND ADENOIDECTOMY      Family History  Problem Relation Age of Onset   Colon cancer Mother    Prostate cancer Father    Colon cancer Father    Diabetes Maternal Aunt    Breast cancer Maternal Aunt    Diabetes Maternal Uncle    Diabetes Paternal Aunt    Stroke Paternal Aunt        > 72   Heart disease Paternal Aunt    Diabetes Paternal Uncle    Breast cancer Maternal Aunt         X 2   Breast cancer Cousin     Social History   Socioeconomic History   Marital status: Married    Spouse name: Dwight   Number of children: 1   Years of education: BS   Highest education level: Not on file  Occupational History   Occupation: Disabled    Employer: RETIRED  Tobacco Use   Smoking status: Never   Smokeless tobacco: Never  Vaping Use   Vaping Use: Never used  Substance and Sexual Activity   Alcohol use: No    Alcohol/week: 0.0 standard drinks   Drug use: No   Sexual activity: Not on file  Other Topics Concern   Not on file  Social History Narrative   Patient lives at home with her husband [name] Patient is retired  and has a Conservation officer, nature.    Caffeine - some times.   Right handed.   Social Determinants of Health   Financial  Resource Strain: Not on file  Food Insecurity: Not on file  Transportation Needs: Not on file  Physical Activity: Not on file  Stress: Not on file  Social Connections: Not on file  Intimate Partner Violence: Not on file    Review of Systems  Constitutional:  Negative for fatigue.  Respiratory:  Positive for apnea.   Psychiatric/Behavioral:  Positive for sleep disturbance.    Vitals:   10/02/20 1451  BP: 129/72  Pulse: 75  Temp: 98.1 F (36.7 C)  SpO2: 95%     Physical Exam Constitutional:      Appearance: She is obese.  HENT:     Head: Normocephalic.     Nose: No congestion.     Mouth/Throat:     Mouth: Mucous membranes are moist.  Eyes:     Pupils: Pupils are equal, round, and reactive to light.  Cardiovascular:     Rate and Rhythm: Normal rate and regular rhythm.     Heart sounds: No murmur heard.   No friction rub.  Pulmonary:     Effort: No respiratory distress.     Breath sounds: No stridor. No wheezing or rhonchi.  Musculoskeletal:     Cervical back: No rigidity or tenderness.  Neurological:     Mental Status: She is alert.  Psychiatric:        Mood and Affect: Mood normal.   Epworth Sleepiness Scale of 5  Data Reviewed: Previous study did reveal mild obstructive sleep apnea  Echocardiogram April 2022 does reveal normal ejection fraction of 60 to 65% with right ventricular systolic pressure of 66 Severe pulmonary hypertension  Assessment:  Pulmonary hypertension  Sleep onset and sleep maintenance insomnia  Severe deconditioning  History of obstructive sleep apnea  Multifactorial pulmonary hypertension due to diastolic heart failure  Plan/Recommendations: Schedule patient for an in lab split-night study  Lunesta at a very low dose will be appropriate to see if this helps get some more quality sleep  Weight loss efforts as able She is pretty limited with severe arthritis  Encouraged to consolidate sleep at night and try not to take naps  during the day  Tentative follow-up in 3 to 4 months  Encouraged to call with any significant concerns  I spent 45 minutes dedicated to the care of this patient on the date of this encounter to include previsit review of records, face-to-face time with the patient discussing conditions above, post visit ordering of testing, clinical documentation with electronic health record and communicated necessary findings to members of the patient's care team     Sherrilyn Rist MD [facility] 10/02/2020, 3:40 PM  CC: [name], MD

## 2020-10-02 NOTE — Telephone Encounter (Signed)
-----   Message from Jerene Bears, MD sent at 10/02/2020  4:27 PM EDT ----- Regarding: FW: Due for colonoscopy Pt needs OV to discuss colonoscopy as now she is on anticoag Fam hx of colon cancer Thanks JMP  ----- Message ----- From: Cordelia Poche Sent: 10/02/2020  11:10 AM EDT To: Jerene Bears, MD Subject: Due for colonoscopy                            Hey Dr. Hilarie Fredrickson,   I saw April [name] today for consultation of recent history of bilateral DVT. Just reviewing her records, she said that her most recent colonoscopy was in 2017. I reviewed her report and you recommended repeat colonoscopy in 5 years due to strong family history of colon cancer. April [name] asked if your team could follow up with her and see if she still needs a repeat colonoscopy.   Thanks for your attention in this matter.   Murray Hodgkins

## 2020-10-02 NOTE — Patient Instructions (Signed)
Order split-night study  Past history of mild obstructive sleep apnea  Weight loss efforts as able  Pain management as able  Consolidate your sleep to nighttime as possible  Trial with Lunesta 1 mg

## 2020-10-02 NOTE — Telephone Encounter (Signed)
I have spoken with patient and scheduled her for an office visit with Dr Zenovia Jarred on 12/20/20 at 64 am. She verbalizes understanding of this information.

## 2020-10-03 ENCOUNTER — Encounter: Payer: Self-pay | Admitting: Internal Medicine

## 2020-10-03 ENCOUNTER — Encounter: Payer: Self-pay | Admitting: Sports Medicine

## 2020-10-03 ENCOUNTER — Ambulatory Visit: Payer: Medicare PPO | Admitting: Sports Medicine

## 2020-10-03 DIAGNOSIS — M79675 Pain in left toe(s): Secondary | ICD-10-CM | POA: Diagnosis not present

## 2020-10-03 DIAGNOSIS — I890 Lymphedema, not elsewhere classified: Secondary | ICD-10-CM

## 2020-10-03 DIAGNOSIS — M79674 Pain in right toe(s): Secondary | ICD-10-CM | POA: Diagnosis not present

## 2020-10-03 DIAGNOSIS — M2142 Flat foot [pes planus] (acquired), left foot: Secondary | ICD-10-CM

## 2020-10-03 DIAGNOSIS — E1142 Type 2 diabetes mellitus with diabetic polyneuropathy: Secondary | ICD-10-CM

## 2020-10-03 DIAGNOSIS — B351 Tinea unguium: Secondary | ICD-10-CM

## 2020-10-03 DIAGNOSIS — M2141 Flat foot [pes planus] (acquired), right foot: Secondary | ICD-10-CM

## 2020-10-03 LAB — DRVVT CONFIRM: dRVVT Confirm: 1 ratio (ref 0.8–1.2)

## 2020-10-03 LAB — LUPUS ANTICOAGULANT PANEL
DRVVT: 81.3 s — ABNORMAL HIGH (ref 0.0–47.0)
PTT Lupus Anticoagulant: 35 s (ref 0.0–51.9)

## 2020-10-03 LAB — DRVVT MIX: dRVVT Mix: 58 s — ABNORMAL HIGH (ref 0.0–40.4)

## 2020-10-03 NOTE — Progress Notes (Signed)
Subjective: April [name] is a 70 y.o. female patient with history of diabetes who returns to office today complaining of long, painful nails; unable to trim. + blood clots and on Eliquis.  Reports that she gets some occasional itching sensation to the bottom of her feet.  No other issues noted.   Patient Active Problem List   Diagnosis Date Noted   Encounter for chronic pain management 09/05/2020   Cellulitis, leg 05/08/2020   Leg DVT (deep venous thromboembolism), acute, bilateral ([place]) 05/08/2020   Acute respiratory failure with hypoxia and hypercapnia (HCC) 05/08/2020   SOB (shortness of breath)    Gout    Morbid obesity ([place])    Pneumonia due to COVID-19 virus 12/10/2018   COVID-19 virus infection 12/06/2018   Right hip pain 11/09/2018   Osteomyelitis of ankle or foot, acute, left ([place]) 09/07/2018   Osteomyelitis of ankle or foot, left, acute ([place]) 09/07/2018   Onychomycosis 05/19/2018   Idiopathic chronic venous hypertension of right lower extremity with ulcer and inflammation ([place]) 05/19/2018   Skin lumps 01/14/2018   Bilateral leg cramps 12/14/2017   Left shoulder pain 08/14/2016   Meralgia paresthetica 07/16/2016   Chronic left-sided low back pain with left-sided sciatica 06/02/2016   Osteopenia 01/11/2016   CKD (chronic kidney disease) stage 3, GFR 30-59 ml/min (HCC) 01/02/2016   Hand paresthesia 07/18/2015   Carpal tunnel syndrome 06/07/2015   Family history of colon cancer    Diabetes mellitus with neurological manifestations ([place]) 04/18/2015   Bilateral leg edema 04/11/2015   Abnormality of gait 01/03/2015   Hereditary and idiopathic peripheral neuropathy 01/03/2015   GERD (gastroesophageal reflux disease) 06/17/2014   Hx of colonic polyps 12/15/2012   Vitamin B12 deficiency 11/03/2012   Intrinsic asthma 03/23/2012   Chronic diastolic heart failure ([place]) 02/20/2011   OSA (obstructive sleep apnea) 09/17/2010   CAD, NATIVE VESSEL 11/20/2008   Osteoarthritis of  knees, bilateral 06/14/2008   Hyperlipidemia 05/10/2007   Essential hypertension 01/18/2007   Hypokalemia 04/30/2006   HX, PERSONAL, PEPTIC ULCER DISEASE 04/30/2006   Current Outpatient Medications on File Prior to Visit  Medication Sig Dispense Refill   acetaminophen (TYLENOL) 325 MG tablet Take 2 tablets (650 mg total) by mouth every 6 (six) hours as needed for moderate pain (or Fever >/= 101). 20 tablet 0   allopurinol (ZYLOPRIM) 100 MG tablet Take 2 tablets (200 mg total) by mouth daily. 180 tablet 1   Budesonide (PULMICORT FLEXHALER) 90 MCG/ACT inhaler Pulmicort Flexhaler 90 mcg/actuation breath activated     budesonide-formoterol (SYMBICORT) 160-4.5 MCG/ACT inhaler one - two inhalations every 12 hours; gargle and spit after use 1 Inhaler 4   calcium carbonate (TUMS - DOSED IN MG ELEMENTAL CALCIUM) 500 MG chewable tablet Chew 2 tablets by mouth daily as needed for indigestion or heartburn.     carvedilol (COREG) 12.5 MG tablet TAKE 1 TABLET(12.5 MG) BY MOUTH TWICE DAILY WITH A MEAL 180 tablet 0   cholecalciferol (VITAMIN D3) 25 MCG (1000 UNIT) tablet Take 1 tablet (1,000 Units total) by mouth daily. 90 tablet 1   Cholecalciferol 25 MCG (1000 UT) capsule cholecalciferol (vitamin D3) 25 mcg (1,000 unit) capsule  TAKE 1 CAPSULE BY MOUTH DAILY     ciprofloxacin (CIPRO) 500 MG tablet ciprofloxacin 500 mg tablet     colchicine 0.6 MG tablet Take 2 tabs po at onset of gout flare, then one hour later take 1 tab 30 tablet 1   cyanocobalamin (,VITAMIN B-12,) 1000 MCG/ML injection INJECT 1ML INTO  MUSCLE EVERY 30 DAYS 10 mL 5   diclofenac sodium (VOLTAREN) 1 % GEL Apply 2 g topically 4 (four) times daily. 100 g 11   ELIQUIS 5 MG TABS tablet CONTINUE WITH 2 TABLETS(10 MG) TWICE DAILY UNTIL 05/19/2020. AFTERWARDS TAKE 1 TABLET(5 MG) TWICE DAILY 60 tablet 2   eszopiclone (LUNESTA) 1 MG TABS tablet Take 1 tablet (1 mg total) by mouth at bedtime as needed for sleep. Take immediately before bedtime 30 tablet  1   famotidine (PEPCID) 20 MG tablet Take 1 tablet (20 mg total) by mouth 2 (two) times daily. 180 tablet 3   ferrous sulfate 325 (65 FE) MG tablet Take 1 tablet (325 mg total) by mouth every Monday, Wednesday, and Friday. 60 tablet 0   folic acid (FOLVITE) 389 MCG tablet Take 1 tablet (400 mcg total) by mouth at bedtime. Take 400 mcg by mouth at bedtime. 90 tablet 1   furosemide (LASIX) 80 MG tablet furosemide 80 mg tablet  TAKE 1 TABLET BY MOUTH TWICE DAILY     gabapentin (NEURONTIN) 100 MG capsule TAKE 1 CAPSULE(100 MG) BY MOUTH THREE TIMES DAILY 90 capsule 1   hydrocortisone 2.5 % cream hydrocortisone 2.5 % topical cream with perineal applicator     hydrOXYzine (ATARAX/VISTARIL) 25 MG tablet Take 1 tablet (25 mg total) by mouth 3 (three) times daily as needed for itching. 30 tablet 0   montelukast (SINGULAIR) 10 MG tablet Take 1 tablet (10 mg total) by mouth at bedtime. 30 tablet 5   Nutritional Supplements (,FEEDING SUPPLEMENT, PROSOURCE PLUS) liquid Take 30 mLs by mouth 2 (two) times daily between meals.     Oxcarbazepine (TRILEPTAL) 300 MG tablet Take 1 tablet (300 mg total) by mouth 2 (two) times daily. 180 tablet 1   oxyCODONE (OXY IR/ROXICODONE) 5 MG immediate release tablet Take 1 tablet (5 mg total) by mouth every 6 (six) hours as needed for severe pain (chronic knee and back pain). 30 tablet 0   polyethylene glycol (MIRALAX / GLYCOLAX) 17 g packet Take 17 g by mouth daily. (Patient taking differently: Take 17 g by mouth daily as needed.) 14 each 0   senna-docusate (SENOKOT-S) 8.6-50 MG tablet Take 1 tablet by mouth at bedtime as needed for mild constipation. 30 tablet 0   simvastatin (ZOCOR) 40 MG tablet Take 1 tablet (40 mg total) by mouth at bedtime. 90 tablet 1   spironolactone (ALDACTONE) 25 MG tablet TAKE 1 TABLET(25 MG) BY MOUTH DAILY 90 tablet 0   torsemide (DEMADEX) 20 MG tablet Take 3 tablets (60 mg total) by mouth 2 (two) times daily. May take an additional tablet as directed  by CHF clinic. 210 tablet 3   trimethoprim-polymyxin b (POLYTRIM) ophthalmic solution polymyxin B sulfate 10,000 unit-trimethoprim 1 mg/mL eye drops     VENTOLIN HFA 108 (90 Base) MCG/ACT inhaler Inhale 2 puffs into the lungs every 4 (four) hours as needed for wheezing.     No current facility-administered medications on file prior to visit.   Allergies  Allergen Reactions   Penicillins Rash and Other (See Comments)    She was told not to take it anymore. Has patient had a PCN reaction causing immediate rash, facial/tongue/throat swelling, SOB or lightheadedness with hypotension: Yes Has patient had a PCN reaction causing severe rash involving mucus membranes or skin necrosis: No Has patient had a PCN reaction that required hospitalization: No Has patient had a PCN reaction occurring within the last 10 years: No If all of the above  answers are "NO", then may proceed with Cephalosporin use.'   Shellfish Allergy Anaphylaxis   Sulfonamide Derivatives Anaphylaxis and Other (See Comments)   Hydrochlorothiazide W-Triamterene Other (See Comments)    Hypokalemia   Lotensin [Benazepril Hcl] Hives   Dipyridamole Other (See Comments)    Unknown reaction   Estrogens Other (See Comments)    Unknown reaction   Hydrochlorothiazide Other (See Comments)    Unknown reaction   Latex Other (See Comments)    Unknown reaction - stated previously during surgery gloves had to be changed, but unsure if it is still an allergy or not.    Metronidazole Other (See Comments)    Unknown reaction   Other Itching    PICKLES   Spironolactone Other (See Comments)    UNSPECIFIED > "kidney problems"   Sulfa Antibiotics Other (See Comments)    Unknown reaction   Torsemide Other (See Comments)    Unknown reaction   Valsartan Other (See Comments)    Unknown reaction   Mustard [Allyl Isothiocyanate] Itching    Recent Results (from the past 2160 hour(s))  Basic metabolic panel     Status: Abnormal   Collection  Time: 08/14/20 11:50 AM  Result Value Ref Range   Sodium 136 135 - 145 mmol/L   Potassium 4.6 3.5 - 5.1 mmol/L   Chloride 96 (L) 98 - 111 mmol/L   CO2 34 (H) 22 - 32 mmol/L   Glucose, Bld 128 (H) 70 - 99 mg/dL    Comment: Glucose reference range applies only to samples taken after fasting for at least 8 hours.   BUN 19 8 - 23 mg/dL   Creatinine, Ser 1.54 (H) 0.44 - 1.00 mg/dL   Calcium 8.9 8.9 - 10.3 mg/dL   GFR, Estimated 36 (L) >60 mL/min    Comment: (NOTE) Calculated using the CKD-EPI Creatinine Equation (2021)    Anion gap 6 5 - 15    Comment: Performed at [facility] [address] [place], [place] [address]  Brain natriuretic peptide     Status: None   Collection Time: 08/14/20 11:50 AM  Result Value Ref Range   B Natriuretic Peptide 53.1 0.0 - 100.0 pg/mL    Comment: Performed at [facility] [address] [place], [place] [address]  Basic metabolic panel     Status: Abnormal   Collection Time: 08/30/20 11:30 AM  Result Value Ref Range   Sodium 140 135 - 145 mmol/L   Potassium 4.3 3.5 - 5.1 mmol/L   Chloride 97 (L) 98 - 111 mmol/L   CO2 35 (H) 22 - 32 mmol/L   Glucose, Bld 124 (H) 70 - 99 mg/dL    Comment: Glucose reference range applies only to samples taken after fasting for at least 8 hours.   BUN 19 8 - 23 mg/dL   Creatinine, Ser 1.41 (H) 0.44 - 1.00 mg/dL   Calcium 8.9 8.9 - 10.3 mg/dL   GFR, Estimated 40 (L) >60 mL/min    Comment: (NOTE) Calculated using the CKD-EPI Creatinine Equation (2021)    Anion gap 8 5 - 15    Comment: Performed at [facility] [address] [place], [place] [address]  Basic metabolic panel     Status: Abnormal   Collection Time: 09/10/20  3:53 PM  Result Value Ref Range   Sodium 137 135 - 145 mmol/L   Potassium 4.7 3.5 - 5.1 mmol/L   Chloride 93 (L) 98 - 111 mmol/L  CO2 34 (H) 22 - 32 mmol/L   Glucose, Bld 81 70 - 99 mg/dL    Comment: Glucose reference range applies only to samples taken after fasting  for at least 8 hours.   BUN 20 8 - 23 mg/dL   Creatinine, Ser 1.26 (H) 0.44 - 1.00 mg/dL   Calcium 9.5 8.9 - 10.3 mg/dL   GFR, Estimated 46 (L) >60 mL/min    Comment: (NOTE) Calculated using the CKD-EPI Creatinine Equation (2021)    Anion gap 10 5 - 15    Comment: Performed at [facility] [address], [place], [place] [address]  POCT glycosylated hemoglobin (Hb A1C)     Status: Abnormal   Collection Time: 09/13/20  4:56 PM  Result Value Ref Range   Hemoglobin A1C 6.0 (A) 4.0 - 5.6 %   HbA1c POC (<> result, manual entry) 6.0 4.0 - 5.6 %   HbA1c, POC (prediabetic range) 6.0 5.7 - 6.4 %   HbA1c, POC (controlled diabetic range) 6.0 0.0 - 7.0 %  CBC with Differential ([facility] Only)     Status: Abnormal   Collection Time: 10/02/20 10:39 AM  Result Value Ref Range   WBC Count 6.1 4.0 - 10.5 K/uL   RBC 4.22 3.87 - 5.11 MIL/uL   Hemoglobin 12.7 12.0 - 15.0 g/dL   HCT 41.0 36.0 - 46.0 %   MCV 97.2 80.0 - 100.0 fL   MCH 30.1 26.0 - 34.0 pg   MCHC 31.0 30.0 - 36.0 g/dL   RDW 15.3 11.5 - 15.5 %   Platelet Count 141 (L) 150 - 400 K/uL   nRBC 0.3 (H) 0.0 - 0.2 %   Neutrophils Relative % 74 %   Neutro Abs 4.5 1.7 - 7.7 K/uL   Lymphocytes Relative 16 %   Lymphs Abs 1.0 0.7 - 4.0 K/uL   Monocytes Relative 6 %   Monocytes Absolute 0.4 0.1 - 1.0 K/uL   Eosinophils Relative 2 %   Eosinophils Absolute 0.1 0.0 - 0.5 K/uL   Basophils Relative 1 %   Basophils Absolute 0.1 0.0 - 0.1 K/uL   Immature Granulocytes 1 %   Abs Immature Granulocytes 0.04 0.00 - 0.07 K/uL    Comment: Performed at [facility], [place] [address], [place], [place] [address]  CMP ([place] only)     Status: Abnormal   Collection Time: 10/02/20 10:39 AM  Result Value Ref Range   Sodium 142 135 - 145 mmol/L   Potassium 4.5 3.5 - 5.1 mmol/L   Chloride 98 98 - 111 mmol/L   CO2 34 (H) 22 - 32 mmol/L   Glucose, Bld 129 (H) 70 - 99 mg/dL    Comment: Glucose reference range applies  only to samples taken after fasting for at least 8 hours.   BUN 22 8 - 23 mg/dL   Creatinine 1.40 (H) 0.44 - 1.00 mg/dL   Calcium 9.6 8.9 - 10.3 mg/dL   Total Protein 7.2 6.5 - 8.1 g/dL   Albumin 3.7 3.5 - 5.0 g/dL   AST 15 15 - 41 U/L   ALT 16 0 - 44 U/L   Alkaline Phosphatase 120 38 - 126 U/L   Total Bilirubin 0.5 0.3 - 1.2 mg/dL   GFR, Estimated 40 (L) >60 mL/min    Comment: (NOTE) Calculated using the CKD-EPI Creatinine Equation (2021)    Anion gap 10 5 - 15    Comment: Performed at [facility],  [place] [address], [place], [place] [address]    Objective: General: Patient is awake, alert, and oriented x 3 and in no acute distress.  Integument: Skin is warm, dry and supple bilateral. Nails are tender, long, thickened and dystrophic with subungual debris, consistent with onychomycosis, 1-5 bilateral. No signs of infection. Dry wound to right anterior shin with no acute signs of infection.   Vasculature:  Dorsalis Pedis pulse 0/4 bilateral. Posterior Tibial pulse  0/4 bilateral. Capillary fill time <5 sec 1-5 bilateral. No hair growth to the level of the digits.Temperature gradient within normal limits. No varicosities present bilateral. Chronic edema present bilateral.   Neurology: The patient has diminished sensation measured with a 5.07/10g Semmes Weinstein Monofilament at all pedal sites bilateral . Vibratory sensation diminished bilateral with tuning fork. No Babinski sign present bilateral.   Musculoskeletal: Right 5th hammertoe and Asymptomatic pes planus pedal deformities noted bilateral. Muscular strength 4/5 in all lower extremity muscular groups bilateral without pain on range of motion. No tenderness with calf compression bilateral.  Assessment and Plan: Problem List Items Addressed This Visit   None Visit Diagnoses     Pain due to onychomycosis of toenails of both feet    -  Primary   Diabetic polyneuropathy associated with type 2 diabetes  mellitus (HCC)       Lymphedema       Pes planus of both feet           -Examined patient. -Re-Discussed and educated patient on diabetic foot care, especially with  regards to the vascular, neurological and musculoskeletal systems.  -Mechanically debrided all nails 1-5 bilateral using sterile nail nipper and filed with dremel without incident  -Recommend to try over-the-counter Lamisil or Tinactin spray for itchy sensation to foot -Answered all patient questions -Patient to return  in 3 months for at risk foot care -Patient advised to call the office if any problems or questions arise in the meantime.  Landis Martins, DPM

## 2020-10-03 NOTE — Patient Instructions (Signed)
Recommend to get OTC lamisil or Tinactin spray with cooling effect for itchy sensation to feet

## 2020-10-04 LAB — CARDIOLIPIN ANTIBODIES, IGG, IGM, IGA
Anticardiolipin IgA: <9 APL U/mL (ref 0–11)
Anticardiolipin IgG: <9 GPL U/mL (ref 0–14)
Anticardiolipin IgM: 13 MPL U/mL — ABNORMAL HIGH (ref 0–12)

## 2020-10-04 LAB — BETA-2-GLYCOPROTEIN I ABS, IGG/M/A
Beta-2 Glyco I IgG: <9 GPI IgG units (ref 0–20)
Beta-2-Glycoprotein I IgA: <9 GPI IgA units (ref 0–25)
Beta-2-Glycoprotein I IgM: <9 GPI IgM units (ref 0–32)

## 2020-10-07 ENCOUNTER — Encounter: Payer: Self-pay | Admitting: *Deleted

## 2020-10-08 ENCOUNTER — Telehealth: Payer: Self-pay | Admitting: Physician Assistant

## 2020-10-08 NOTE — Telephone Encounter (Signed)
I called April [name] to review lab work from 10/02/2020.  CBC showed that her anemia has resolved and thrombocytopenia has improved.  Creatinine levels are stable.  There is no evidence of antiphospholipid antibody syndrome.  Recommendation is to continue with Eliquis at current dose.  Patient notes that she reviewed the application for patient assistance program but does not feel she will be eligible as her income is over the required limit.  I discussed alternative options for anticoagulation including Coumadin in the setting of her chronic kidney disease.  Patient does not want to switch therapies at this time and will call us if cost becomes an issue in the future.  Patient is scheduled to see [place] back in October which will be approximately 6 months since starting anticoagulation.  We will determine at that point if she is eligible for the maintenance dose.  She expressed understanding and satisfaction with the plan provided.

## 2020-10-10 ENCOUNTER — Telehealth: Payer: Self-pay

## 2020-10-14 ENCOUNTER — Other Ambulatory Visit: Payer: Self-pay | Admitting: Internal Medicine

## 2020-10-14 ENCOUNTER — Telehealth: Payer: Self-pay | Admitting: Sports Medicine

## 2020-10-14 MED ORDER — BD SWAB SINGLE USE REGULAR PADS: MEDICATED_PAD | 3 refills | Status: DC

## 2020-10-14 MED ORDER — TRUE METRIX LEVEL 1 LOW VI SOLN: 3 refills | Status: AC

## 2020-10-14 NOTE — Telephone Encounter (Signed)
Pt left message stating she got a bill and is not sure why for the diabetic shoes and inserts. She has not even worn the shoes or inserts because she does not like them. She stated Dr Cannon Kettle told pt I would call her about the shoes.    I returned call and a explained that there is a time limit to return the shoes and it has been almost 3 months since she got the shoes. She said she was not aware of the time limit and I explained it was on her documents she received and states 1 wk but we usually give you a month. I also explained that we are limited to the shoes she is able to get due to her swelling(Lymphedema), we do not want to get her any shoes that will cut off her circulation and cause any additional issues for her.  As far as me calling I miss understood Dr Cannon Kettle because we discussed it and I thought she told pt more than likely we could not return them as it has been a while since she received the shoes.We also discussed pt has limited choices due to her condition.  As far as the bill I told pt the insurance company is stating she has received the inserts already for this year and I know we only billed for the ones we ordered but she needs to call them and let them know she has not received any others from any other place. I think it is a glitch in there system. She was going to call the insurance to get it corrected.

## 2020-10-16 ENCOUNTER — Telehealth: Payer: Self-pay | Admitting: Internal Medicine

## 2020-10-16 NOTE — Telephone Encounter (Signed)
1.Medication Requested: ferrous sulfate 325 (65 FE) MG tablet  2. Pharmacy (Name, Street, [place]): [facility] [phone] - [place], [place] [address] [place]  Phone:  [phone] Fax:  [phone]   3. On Med List: yes  4. Last Visit with PCP: 08.12.22  5. Next visit date with PCP: n/a   Agent: Please be advised that RX refills may take up to 3 business days. We ask that you follow-up with your pharmacy.

## 2020-10-17 ENCOUNTER — Other Ambulatory Visit: Payer: Self-pay | Admitting: Internal Medicine

## 2020-10-17 NOTE — Telephone Encounter (Signed)
Sent in today 

## 2020-10-18 ENCOUNTER — Ambulatory Visit (INDEPENDENT_AMBULATORY_CARE_PROVIDER_SITE_OTHER): Payer: Medicare PPO

## 2020-10-18 ENCOUNTER — Ambulatory Visit: Payer: Medicare PPO

## 2020-10-18 ENCOUNTER — Other Ambulatory Visit: Payer: Self-pay

## 2020-10-18 DIAGNOSIS — Z23 Encounter for immunization: Secondary | ICD-10-CM | POA: Diagnosis not present

## 2020-10-18 DIAGNOSIS — E538 Deficiency of other specified B group vitamins: Secondary | ICD-10-CM | POA: Diagnosis not present

## 2020-10-18 MED ORDER — CYANOCOBALAMIN 1000 MCG/ML IJ SOLN
1000.0000 ug | Freq: Once | INTRAMUSCULAR | Status: AC
  Administered 2020-10-18: 1000 ug via INTRAMUSCULAR

## 2020-10-18 NOTE — Progress Notes (Signed)
b12 Injection given. Flu immunization administered today.    [name], MD

## 2020-10-18 NOTE — Progress Notes (Signed)
Patient here for monthly B12 injection per Dr. Quay Burow, B12 1000 mcg given IM in left arm and patient tolerated injection well today.

## 2020-10-26 ENCOUNTER — Other Ambulatory Visit: Payer: Self-pay | Admitting: Internal Medicine

## 2020-10-28 ENCOUNTER — Telehealth: Payer: Self-pay | Admitting: Internal Medicine

## 2020-10-28 MED ORDER — OXYCODONE HCL 5 MG PO TABS: 5.0000 mg | ORAL_TABLET | Freq: Four times a day (QID) | ORAL | 0 refills | Status: DC | PRN

## 2020-10-28 NOTE — Telephone Encounter (Signed)
1.Medication Requested: oxyCODONE (OXY IR/ROXICODONE) 5 MG immediate release tablet  2. Pharmacy (Name, Street, [place]):  [facility] [phone] - [place], [address] AT [place] Phone:  [phone]  Fax:  [phone]      3. On Med List: yes  4. Last Visit with PCP: 08.12.22  5. Next visit date with PCP: n/a   Agent: Please be advised that RX refills may take up to 3 business days. We ask that you follow-up with your pharmacy.

## 2020-11-11 ENCOUNTER — Telehealth: Payer: Self-pay | Admitting: Internal Medicine

## 2020-11-11 ENCOUNTER — Other Ambulatory Visit: Payer: Self-pay

## 2020-11-11 DIAGNOSIS — E661 Drug-induced obesity: Secondary | ICD-10-CM | POA: Diagnosis not present

## 2020-11-11 DIAGNOSIS — R609 Edema, unspecified: Secondary | ICD-10-CM | POA: Diagnosis not present

## 2020-11-11 DIAGNOSIS — M6281 Muscle weakness (generalized): Secondary | ICD-10-CM | POA: Diagnosis not present

## 2020-11-11 DIAGNOSIS — I509 Heart failure, unspecified: Secondary | ICD-10-CM | POA: Diagnosis not present

## 2020-11-11 MED ORDER — FAMOTIDINE 20 MG PO TABS: 20.0000 mg | ORAL_TABLET | Freq: Two times a day (BID) | ORAL | 3 refills | Status: DC

## 2020-11-11 NOTE — Telephone Encounter (Signed)
Sent in today 

## 2020-11-11 NOTE — Telephone Encounter (Signed)
1.Medication Requested: famotidine (PEPCID) 20 MG tablet  2. Pharmacy (Name, Street, [place]): [facility] [phone] - [place], [address] AT [address]  Phone:  [phone] Fax:  [phone]   3. On Med List: yes  4. Last Visit with PCP: 08.12.22  5. Next visit date with PCP: n/a  Patient says she is taking rx as prescribed but it is not working well she is still having the acid reflux.. wants to know if provider can send something else to help   Agent: Please be advised that RX refills may take up to 3 business days. We ask that you follow-up with your pharmacy.

## 2020-11-15 ENCOUNTER — Ambulatory Visit (HOSPITAL_COMMUNITY)
Admission: RE | Admit: 2020-11-15 | Discharge: 2020-11-15 | Disposition: A | Discharge status: Home / Self Care | Payer: Medicare PPO | Source: Ambulatory Visit | Attending: Internal Medicine | Admitting: Internal Medicine

## 2020-11-15 ENCOUNTER — Inpatient Hospital Stay (HOSPITAL_COMMUNITY)
Admission: EM | Admit: 2020-11-15 | Discharge: 2020-11-22 | DRG: 286 | Disposition: A | Discharge status: Home Health | Payer: Medicare PPO | Attending: Internal Medicine | Admitting: Internal Medicine

## 2020-11-15 ENCOUNTER — Other Ambulatory Visit: Payer: Self-pay

## 2020-11-15 ENCOUNTER — Emergency Department (HOSPITAL_COMMUNITY): Payer: Medicare PPO

## 2020-11-15 ENCOUNTER — Ambulatory Visit (INDEPENDENT_AMBULATORY_CARE_PROVIDER_SITE_OTHER): Payer: Medicare PPO

## 2020-11-15 VITALS — BP 132/84 | HR 73 | Ht 64.0 in

## 2020-11-15 DIAGNOSIS — Z880 Allergy status to penicillin: Secondary | ICD-10-CM

## 2020-11-15 DIAGNOSIS — Z6841 Body Mass Index (BMI) 40.0 and over, adult: Secondary | ICD-10-CM

## 2020-11-15 DIAGNOSIS — Z833 Family history of diabetes mellitus: Secondary | ICD-10-CM

## 2020-11-15 DIAGNOSIS — Z791 Long term (current) use of non-steroidal anti-inflammatories (NSAID): Secondary | ICD-10-CM | POA: Insufficient documentation

## 2020-11-15 DIAGNOSIS — E876 Hypokalemia: Secondary | ICD-10-CM | POA: Diagnosis present

## 2020-11-15 DIAGNOSIS — J9811 Atelectasis: Secondary | ICD-10-CM | POA: Diagnosis present

## 2020-11-15 DIAGNOSIS — Z09 Encounter for follow-up examination after completed treatment for conditions other than malignant neoplasm: Secondary | ICD-10-CM | POA: Insufficient documentation

## 2020-11-15 DIAGNOSIS — Z87442 Personal history of urinary calculi: Secondary | ICD-10-CM

## 2020-11-15 DIAGNOSIS — R079 Chest pain, unspecified: Secondary | ICD-10-CM | POA: Diagnosis not present

## 2020-11-15 DIAGNOSIS — Z86718 Personal history of other venous thrombosis and embolism: Secondary | ICD-10-CM | POA: Diagnosis not present

## 2020-11-15 DIAGNOSIS — K219 Gastro-esophageal reflux disease without esophagitis: Secondary | ICD-10-CM | POA: Diagnosis present

## 2020-11-15 DIAGNOSIS — Z7951 Long term (current) use of inhaled steroids: Secondary | ICD-10-CM | POA: Insufficient documentation

## 2020-11-15 DIAGNOSIS — I2510 Atherosclerotic heart disease of native coronary artery without angina pectoris: Secondary | ICD-10-CM | POA: Insufficient documentation

## 2020-11-15 DIAGNOSIS — J9602 Acute respiratory failure with hypercapnia: Secondary | ICD-10-CM | POA: Diagnosis not present

## 2020-11-15 DIAGNOSIS — Z79899 Other long term (current) drug therapy: Secondary | ICD-10-CM | POA: Insufficient documentation

## 2020-11-15 DIAGNOSIS — I272 Pulmonary hypertension, unspecified: Secondary | ICD-10-CM | POA: Insufficient documentation

## 2020-11-15 DIAGNOSIS — N179 Acute kidney failure, unspecified: Secondary | ICD-10-CM | POA: Diagnosis present

## 2020-11-15 DIAGNOSIS — M1710 Unilateral primary osteoarthritis, unspecified knee: Secondary | ICD-10-CM | POA: Diagnosis present

## 2020-11-15 DIAGNOSIS — N183 Chronic kidney disease, stage 3 unspecified: Secondary | ICD-10-CM | POA: Diagnosis not present

## 2020-11-15 DIAGNOSIS — R531 Weakness: Secondary | ICD-10-CM | POA: Insufficient documentation

## 2020-11-15 DIAGNOSIS — I130 Hypertensive heart and chronic kidney disease with heart failure and stage 1 through stage 4 chronic kidney disease, or unspecified chronic kidney disease: Secondary | ICD-10-CM | POA: Diagnosis present

## 2020-11-15 DIAGNOSIS — D649 Anemia, unspecified: Secondary | ICD-10-CM | POA: Diagnosis present

## 2020-11-15 DIAGNOSIS — I5033 Acute on chronic diastolic (congestive) heart failure: Secondary | ICD-10-CM | POA: Diagnosis present

## 2020-11-15 DIAGNOSIS — Z882 Allergy status to sulfonamides status: Secondary | ICD-10-CM

## 2020-11-15 DIAGNOSIS — J9601 Acute respiratory failure with hypoxia: Secondary | ICD-10-CM | POA: Diagnosis not present

## 2020-11-15 DIAGNOSIS — Z20822 Contact with and (suspected) exposure to covid-19: Secondary | ICD-10-CM | POA: Diagnosis present

## 2020-11-15 DIAGNOSIS — J9620 Acute and chronic respiratory failure, unspecified whether with hypoxia or hypercapnia: Secondary | ICD-10-CM | POA: Diagnosis present

## 2020-11-15 DIAGNOSIS — E538 Deficiency of other specified B group vitamins: Secondary | ICD-10-CM | POA: Diagnosis present

## 2020-11-15 DIAGNOSIS — I10 Essential (primary) hypertension: Secondary | ICD-10-CM | POA: Diagnosis present

## 2020-11-15 DIAGNOSIS — G4733 Obstructive sleep apnea (adult) (pediatric): Secondary | ICD-10-CM | POA: Diagnosis not present

## 2020-11-15 DIAGNOSIS — Z91040 Latex allergy status: Secondary | ICD-10-CM

## 2020-11-15 DIAGNOSIS — Z7901 Long term (current) use of anticoagulants: Secondary | ICD-10-CM

## 2020-11-15 DIAGNOSIS — D696 Thrombocytopenia, unspecified: Secondary | ICD-10-CM | POA: Diagnosis present

## 2020-11-15 DIAGNOSIS — E1149 Type 2 diabetes mellitus with other diabetic neurological complication: Secondary | ICD-10-CM | POA: Diagnosis present

## 2020-11-15 DIAGNOSIS — I110 Hypertensive heart disease with heart failure: Secondary | ICD-10-CM | POA: Diagnosis not present

## 2020-11-15 DIAGNOSIS — Z888 Allergy status to other drugs, medicaments and biological substances status: Secondary | ICD-10-CM

## 2020-11-15 DIAGNOSIS — J45909 Unspecified asthma, uncomplicated: Secondary | ICD-10-CM | POA: Diagnosis present

## 2020-11-15 DIAGNOSIS — E662 Morbid (severe) obesity with alveolar hypoventilation: Secondary | ICD-10-CM | POA: Diagnosis present

## 2020-11-15 DIAGNOSIS — M109 Gout, unspecified: Secondary | ICD-10-CM | POA: Diagnosis present

## 2020-11-15 DIAGNOSIS — Z90710 Acquired absence of both cervix and uterus: Secondary | ICD-10-CM

## 2020-11-15 DIAGNOSIS — E1122 Type 2 diabetes mellitus with diabetic chronic kidney disease: Secondary | ICD-10-CM | POA: Diagnosis present

## 2020-11-15 DIAGNOSIS — E785 Hyperlipidemia, unspecified: Secondary | ICD-10-CM | POA: Diagnosis present

## 2020-11-15 DIAGNOSIS — Z8249 Family history of ischemic heart disease and other diseases of the circulatory system: Secondary | ICD-10-CM

## 2020-11-15 DIAGNOSIS — I5032 Chronic diastolic (congestive) heart failure: Secondary | ICD-10-CM | POA: Insufficient documentation

## 2020-11-15 DIAGNOSIS — N1832 Chronic kidney disease, stage 3b: Secondary | ICD-10-CM | POA: Diagnosis present

## 2020-11-15 DIAGNOSIS — Z8719 Personal history of other diseases of the digestive system: Secondary | ICD-10-CM

## 2020-11-15 DIAGNOSIS — N390 Urinary tract infection, site not specified: Secondary | ICD-10-CM | POA: Diagnosis not present

## 2020-11-15 DIAGNOSIS — G5603 Carpal tunnel syndrome, bilateral upper limbs: Secondary | ICD-10-CM | POA: Diagnosis present

## 2020-11-15 DIAGNOSIS — Z91013 Allergy to seafood: Secondary | ICD-10-CM

## 2020-11-15 DIAGNOSIS — Z91018 Allergy to other foods: Secondary | ICD-10-CM

## 2020-11-15 DIAGNOSIS — R5381 Other malaise: Secondary | ICD-10-CM | POA: Diagnosis present

## 2020-11-15 DIAGNOSIS — J9611 Chronic respiratory failure with hypoxia: Secondary | ICD-10-CM | POA: Insufficient documentation

## 2020-11-15 DIAGNOSIS — J9612 Chronic respiratory failure with hypercapnia: Secondary | ICD-10-CM | POA: Diagnosis present

## 2020-11-15 DIAGNOSIS — Z8711 Personal history of peptic ulcer disease: Secondary | ICD-10-CM

## 2020-11-15 DIAGNOSIS — I700 Atherosclerosis of aorta: Secondary | ICD-10-CM | POA: Diagnosis not present

## 2020-11-15 DIAGNOSIS — I517 Cardiomegaly: Secondary | ICD-10-CM | POA: Diagnosis not present

## 2020-11-15 DIAGNOSIS — R262 Difficulty in walking, not elsewhere classified: Secondary | ICD-10-CM | POA: Diagnosis present

## 2020-11-15 DIAGNOSIS — R509 Fever, unspecified: Secondary | ICD-10-CM | POA: Diagnosis not present

## 2020-11-15 DIAGNOSIS — R0602 Shortness of breath: Secondary | ICD-10-CM | POA: Diagnosis present

## 2020-11-15 DIAGNOSIS — J9622 Acute and chronic respiratory failure with hypercapnia: Secondary | ICD-10-CM | POA: Diagnosis present

## 2020-11-15 DIAGNOSIS — J9621 Acute and chronic respiratory failure with hypoxia: Secondary | ICD-10-CM | POA: Diagnosis present

## 2020-11-15 DIAGNOSIS — I509 Heart failure, unspecified: Secondary | ICD-10-CM

## 2020-11-15 DIAGNOSIS — M1610 Unilateral primary osteoarthritis, unspecified hip: Secondary | ICD-10-CM | POA: Diagnosis present

## 2020-11-15 DIAGNOSIS — G609 Hereditary and idiopathic neuropathy, unspecified: Secondary | ICD-10-CM | POA: Diagnosis present

## 2020-11-15 DIAGNOSIS — E1140 Type 2 diabetes mellitus with diabetic neuropathy, unspecified: Secondary | ICD-10-CM

## 2020-11-15 LAB — RESP PANEL BY RT-PCR (FLU A&B, COVID) ARPGX2
Influenza A by PCR: NEGATIVE
Influenza B by PCR: NEGATIVE
SARS Coronavirus 2 by RT PCR: NEGATIVE

## 2020-11-15 LAB — CBC WITH DIFFERENTIAL/PLATELET
Abs Immature Granulocytes: 0.04 10*3/uL (ref 0.00–0.07)
Basophils Absolute: 0.1 10*3/uL (ref 0.0–0.1)
Basophils Relative: 1 %
Eosinophils Absolute: 0.1 10*3/uL (ref 0.0–0.5)
Eosinophils Relative: 1 %
HCT: 41.3 % (ref 36.0–46.0)
Hemoglobin: 12.3 g/dL (ref 12.0–15.0)
Immature Granulocytes: 1 %
Lymphocytes Relative: 15 %
Lymphs Abs: 0.9 10*3/uL (ref 0.7–4.0)
MCH: 30.7 pg (ref 26.0–34.0)
MCHC: 29.8 g/dL — ABNORMAL LOW (ref 30.0–36.0)
MCV: 103 fL — ABNORMAL HIGH (ref 80.0–100.0)
Monocytes Absolute: 0.4 10*3/uL (ref 0.1–1.0)
Monocytes Relative: 7 %
Neutro Abs: 4.6 10*3/uL (ref 1.7–7.7)
Neutrophils Relative %: 75 %
Platelets: 166 10*3/uL (ref 150–400)
RBC: 4.01 MIL/uL (ref 3.87–5.11)
RDW: 17.6 % — ABNORMAL HIGH (ref 11.5–15.5)
WBC: 6 10*3/uL (ref 4.0–10.5)
nRBC: 0.8 % — ABNORMAL HIGH (ref 0.0–0.2)

## 2020-11-15 LAB — BASIC METABOLIC PANEL
Anion gap: 8 (ref 5–15)
BUN: 21 mg/dL (ref 8–23)
CO2: 33 mmol/L — ABNORMAL HIGH (ref 22–32)
Calcium: 9 mg/dL (ref 8.9–10.3)
Chloride: 98 mmol/L (ref 98–111)
Creatinine, Ser: 1.65 mg/dL — ABNORMAL HIGH (ref 0.44–1.00)
GFR, Estimated: 33 mL/min — ABNORMAL LOW (ref >60)
Glucose, Bld: 130 mg/dL — ABNORMAL HIGH (ref 70–99)
Potassium: 4.6 mmol/L (ref 3.5–5.1)
Sodium: 139 mmol/L (ref 135–145)

## 2020-11-15 LAB — I-STAT ARTERIAL BLOOD GAS, ED
Acid-Base Excess: 9 mmol/L — ABNORMAL HIGH (ref 0.0–2.0)
Bicarbonate: 38.4 mmol/L — ABNORMAL HIGH (ref 20.0–28.0)
Calcium, Ion: 1.22 mmol/L (ref 1.15–1.40)
HCT: 40 % (ref 36.0–46.0)
Hemoglobin: 13.6 g/dL (ref 12.0–15.0)
O2 Saturation: 90 %
Potassium: 4.2 mmol/L (ref 3.5–5.1)
Sodium: 141 mmol/L (ref 135–145)
TCO2: 41 mmol/L — ABNORMAL HIGH (ref 22–32)
pCO2 arterial: 79.9 mmHg (ref 32.0–48.0)
pH, Arterial: 7.29 — ABNORMAL LOW (ref 7.350–7.450)
pO2, Arterial: 69 mmHg — ABNORMAL LOW (ref 83.0–108.0)

## 2020-11-15 LAB — TROPONIN I (HIGH SENSITIVITY)
Troponin I (High Sensitivity): 9 ng/L (ref <18)
Troponin I (High Sensitivity): 7 ng/L (ref <18)

## 2020-11-15 LAB — BRAIN NATRIURETIC PEPTIDE: B Natriuretic Peptide: 129.4 pg/mL — ABNORMAL HIGH (ref 0.0–100.0)

## 2020-11-15 LAB — TSH: TSH: 4.317 u[IU]/mL (ref 0.350–4.500)

## 2020-11-15 LAB — AMMONIA: Ammonia: <10 umol/L (ref 9–35)

## 2020-11-15 MED ORDER — FUROSEMIDE 10 MG/ML IJ SOLN
80.0000 mg | Freq: Two times a day (BID) | INTRAMUSCULAR | Status: DC
  Administered 2020-11-15 – 2020-11-19 (×9): 80 mg via INTRAVENOUS
  Filled 2020-11-15 (×10): qty 8

## 2020-11-15 MED ORDER — FERROUS SULFATE 325 (65 FE) MG PO TABS
325.0000 mg | ORAL_TABLET | ORAL | Status: DC
  Administered 2020-11-18 – 2020-11-22 (×3): 325 mg via ORAL
  Filled 2020-11-15 (×4): qty 1

## 2020-11-15 MED ORDER — ONDANSETRON HCL 4 MG PO TABS: 4.0000 mg | ORAL_TABLET | Freq: Four times a day (QID) | ORAL | Status: DC | PRN

## 2020-11-15 MED ORDER — MONTELUKAST SODIUM 10 MG PO TABS
10.0000 mg | ORAL_TABLET | Freq: Every day | ORAL | Status: DC
  Administered 2020-11-15 – 2020-11-21 (×7): 10 mg via ORAL
  Filled 2020-11-15 (×7): qty 1

## 2020-11-15 MED ORDER — VITAMIN D 25 MCG (1000 UNIT) PO TABS
1000.0000 [IU] | ORAL_TABLET | Freq: Every day | ORAL | Status: DC
  Administered 2020-11-16 – 2020-11-22 (×7): 1000 [IU] via ORAL
  Filled 2020-11-15 (×7): qty 1

## 2020-11-15 MED ORDER — PROSOURCE PLUS PO LIQD
30.0000 mL | Freq: Two times a day (BID) | ORAL | Status: DC
  Administered 2020-11-16 – 2020-11-18 (×3): 30 mL via ORAL
  Filled 2020-11-15 (×5): qty 30

## 2020-11-15 MED ORDER — CARVEDILOL 12.5 MG PO TABS
12.5000 mg | ORAL_TABLET | Freq: Two times a day (BID) | ORAL | Status: DC
  Administered 2020-11-16 – 2020-11-17 (×4): 12.5 mg via ORAL
  Filled 2020-11-15 (×4): qty 1

## 2020-11-15 MED ORDER — POTASSIUM CHLORIDE CRYS ER 20 MEQ PO TBCR
20.0000 meq | EXTENDED_RELEASE_TABLET | Freq: Three times a day (TID) | ORAL | Status: DC
  Administered 2020-11-15 – 2020-11-17 (×5): 20 meq via ORAL
  Filled 2020-11-15 (×5): qty 1

## 2020-11-15 MED ORDER — ACETAMINOPHEN 325 MG PO TABS
650.0000 mg | ORAL_TABLET | Freq: Four times a day (QID) | ORAL | Status: DC | PRN
  Administered 2020-11-16 – 2020-11-21 (×5): 650 mg via ORAL
  Filled 2020-11-15 (×5): qty 2

## 2020-11-15 MED ORDER — DICLOFENAC SODIUM 1 % EX GEL
2.0000 g | Freq: Four times a day (QID) | CUTANEOUS | Status: DC | PRN
  Administered 2020-11-18 – 2020-11-21 (×3): 2 g via TOPICAL
  Filled 2020-11-15: qty 100

## 2020-11-15 MED ORDER — MOMETASONE FURO-FORMOTEROL FUM 200-5 MCG/ACT IN AERO
2.0000 | INHALATION_SPRAY | Freq: Two times a day (BID) | RESPIRATORY_TRACT | Status: DC
  Administered 2020-11-16 – 2020-11-22 (×13): 2 via RESPIRATORY_TRACT
  Filled 2020-11-15: qty 8.8

## 2020-11-15 MED ORDER — FAMOTIDINE 20 MG PO TABS
20.0000 mg | ORAL_TABLET | Freq: Two times a day (BID) | ORAL | Status: DC
  Administered 2020-11-15 – 2020-11-22 (×14): 20 mg via ORAL
  Filled 2020-11-15 (×14): qty 1

## 2020-11-15 MED ORDER — FOLIC ACID 1 MG PO TABS
1000.0000 ug | ORAL_TABLET | Freq: Every day | ORAL | Status: DC
  Administered 2020-11-15 – 2020-11-21 (×7): 1 mg via ORAL
  Filled 2020-11-15 (×7): qty 1

## 2020-11-15 MED ORDER — SENNOSIDES-DOCUSATE SODIUM 8.6-50 MG PO TABS: 1.0000 | ORAL_TABLET | Freq: Every evening | ORAL | Status: DC | PRN

## 2020-11-15 MED ORDER — CYANOCOBALAMIN 1000 MCG/ML IJ SOLN
1000.0000 ug | Freq: Once | INTRAMUSCULAR | Status: AC
  Administered 2020-11-15: 1000 ug via INTRAMUSCULAR

## 2020-11-15 MED ORDER — APIXABAN 5 MG PO TABS
5.0000 mg | ORAL_TABLET | Freq: Two times a day (BID) | ORAL | Status: DC
  Administered 2020-11-15 – 2020-11-22 (×14): 5 mg via ORAL
  Filled 2020-11-15 (×14): qty 1

## 2020-11-15 MED ORDER — CYANOCOBALAMIN 1000 MCG/ML IJ SOLN: 1000.0000 ug | Freq: Once | INTRAMUSCULAR | Status: DC

## 2020-11-15 MED ORDER — GABAPENTIN 100 MG PO CAPS
100.0000 mg | ORAL_CAPSULE | Freq: Three times a day (TID) | ORAL | Status: DC
  Administered 2020-11-15 – 2020-11-22 (×19): 100 mg via ORAL
  Filled 2020-11-15 (×19): qty 1

## 2020-11-15 MED ORDER — ALBUTEROL SULFATE (2.5 MG/3ML) 0.083% IN NEBU: 2.5000 mg | INHALATION_SOLUTION | RESPIRATORY_TRACT | Status: DC | PRN

## 2020-11-15 MED ORDER — ALLOPURINOL 100 MG PO TABS
200.0000 mg | ORAL_TABLET | Freq: Every day | ORAL | Status: DC
  Administered 2020-11-16 – 2020-11-22 (×7): 200 mg via ORAL
  Filled 2020-11-15 (×7): qty 2

## 2020-11-15 MED ORDER — SPIRONOLACTONE 25 MG PO TABS
25.0000 mg | ORAL_TABLET | Freq: Every day | ORAL | Status: DC
  Administered 2020-11-16 – 2020-11-22 (×7): 25 mg via ORAL
  Filled 2020-11-15 (×8): qty 1

## 2020-11-15 MED ORDER — HYDROXYZINE HCL 25 MG PO TABS
25.0000 mg | ORAL_TABLET | Freq: Three times a day (TID) | ORAL | Status: DC | PRN
  Administered 2020-11-16: 25 mg via ORAL
  Filled 2020-11-15: qty 1

## 2020-11-15 MED ORDER — OXCARBAZEPINE 300 MG PO TABS
300.0000 mg | ORAL_TABLET | Freq: Two times a day (BID) | ORAL | Status: DC
  Administered 2020-11-15 – 2020-11-22 (×14): 300 mg via ORAL
  Filled 2020-11-15 (×15): qty 1

## 2020-11-15 MED ORDER — ONDANSETRON HCL 4 MG/2ML IJ SOLN: 4.0000 mg | Freq: Four times a day (QID) | INTRAMUSCULAR | Status: DC | PRN

## 2020-11-15 MED ORDER — POLYETHYLENE GLYCOL 3350 17 G PO PACK: 17.0000 g | PACK | Freq: Every day | ORAL | Status: DC | PRN

## 2020-11-15 MED ORDER — SIMVASTATIN 20 MG PO TABS
40.0000 mg | ORAL_TABLET | Freq: Every day | ORAL | Status: DC
  Administered 2020-11-15 – 2020-11-17 (×3): 40 mg via ORAL
  Filled 2020-11-15 (×3): qty 2

## 2020-11-15 NOTE — Progress Notes (Signed)
Pt given B12 injection w/o any complications. 

## 2020-11-15 NOTE — ED Provider Notes (Signed)
[facility]ERGENC[facility] DEPARTMENT Provider Note   CSN: [id number] Arrival date & time: 11/15/20  1237     History Chief Complaint  Patient presents with   Shortness of Breath    April [name] is a 70 y.o. female.  The history is provided by the patient and medical records. No language interpreter was used.  Shortness of Breath Severity:  Severe Onset quality:  Gradual Timing:  Constant Progression:  Unchanged Chronicity:  New Context: not URI   Relieved by:  Nothing Worsened by:  Exertion Ineffective treatments:  None tried Associated symptoms: no abdominal pain, no chest pain, no cough, no diaphoresis, no fever, no headaches, no neck pain, no rash, no sputum production, no vomiting and no wheezing       Past Medical History:  Diagnosis Date   Anemia    Asthma    CAD (coronary artery disease)    Carpal tunnel syndrome    Cellulitis of both lower extremities 04/11/2015   CHF (congestive heart failure) ([place])    Chronic kidney disease    CKI- followed by [facility]   Colon polyp, hyperplastic 1194 & 1740   Complication of anesthesia 1999   svt with renal calculi surgery, no problems since   CTS (carpal tunnel syndrome)    bilateral   Diabetes mellitus    Eczema    GERD (gastroesophageal reflux disease)    History of kidney stones 1999   Hyperlipidemia    Hypertension    Leg ulcer ([place]) 04/24/2015   Right lateral leg No evidence of an infection Monitor closely Keep edema controlled    Leg ulcer ([place])    right lower   Meralgia paresthetica    Dr. [name]   Morbid obesity ([place])    Neuropathy    toes and legs   Osteoarthrosis, unspecified whether generalized or localized, lower leg    knee   PUD (peptic ulcer disease)    Shortness of breath dyspnea    with exertion   Sleep apnea    per progress note 02/25/2018   Type II or unspecified type diabetes mellitus without mention of complication, not stated as uncontrolled    Unspecified  hereditary and idiopathic peripheral neuropathy    Urticaria    Vitamin B12 deficiency    Wears glasses    Wound cellulitis    right upper leg, healing well    Patient Active Problem List   Diagnosis Date Noted   Encounter for chronic pain management 09/05/2020   Cellulitis, leg 05/08/2020   Leg DVT (deep venous thromboembolism), acute, bilateral ([place]) 05/08/2020   Acute respiratory failure with hypoxia and hypercapnia (HCC) 05/08/2020   SOB (shortness of breath)    Gout    Morbid obesity ([place])    Pneumonia due to COVID-19 virus 12/10/2018   COVID-19 virus infection 12/06/2018   Right hip pain 11/09/2018   Osteomyelitis of ankle or foot, acute, left ([place]) 09/07/2018   Osteomyelitis of ankle or foot, left, acute ([place]) 09/07/2018   Onychomycosis 05/19/2018   Idiopathic chronic venous hypertension of right lower extremity with ulcer and inflammation ([place]) 05/19/2018   Skin lumps 01/14/2018   Bilateral leg cramps 12/14/2017   Left shoulder pain 08/14/2016   Meralgia paresthetica 07/16/2016   Chronic left-sided low back pain with left-sided sciatica 06/02/2016   Osteopenia 01/11/2016   CKD (chronic kidney disease) stage 3, GFR 30-59 ml/min (HCC) 01/02/2016   Hand paresthesia 07/18/2015   Carpal tunnel syndrome 06/07/2015   Family  history of colon cancer    Diabetes mellitus with neurological manifestations ([place]) 04/18/2015   Bilateral leg edema 04/11/2015   Abnormality of gait 01/03/2015   Hereditary and idiopathic peripheral neuropathy 01/03/2015   GERD (gastroesophageal reflux disease) 06/17/2014   Hx of colonic polyps 12/15/2012   Vitamin B12 deficiency 11/03/2012   Intrinsic asthma 03/23/2012   Chronic diastolic heart failure ([place]) 02/20/2011   OSA (obstructive sleep apnea) 09/17/2010   CAD, NATIVE VESSEL 11/20/2008   Osteoarthritis of knees, bilateral 06/14/2008   Hyperlipidemia 05/10/2007   Essential hypertension 01/18/2007   Hypokalemia 04/30/2006   HX, PERSONAL,  PEPTIC ULCER DISEASE 04/30/2006    Past Surgical History:  Procedure Laterality Date   ABDOMINAL HYSTERECTOMY     BREAST BIOPSY Right 07/08/2018   CARDIAC CATHETERIZATION  2002   non obstructive disease   colonoscopy with polypectomy  2007 & 2012    hyperplastic ;Dr Watt Climes   COLONOSCOPY WITH PROPOFOL N/A 06/04/2015   Procedure: COLONOSCOPY WITH PROPOFOL;  Surgeon: Jerene Bears, MD;  Location: [facility];  Service: Gastroenterology;  Laterality: N/A;   DEBRIDEMENT LEG Right 03/02/2018   WOUND VAC APPLIED   DEBRIDEMENT LEG Right 05/27/2018   RIGHT LOWER LEG DEBRIDEMENT,  SKIN GRAFT, VAC PLACEMENT    DILATION AND CURETTAGE OF UTERUS     multiple   HEMORRHOID SURGERY     I & D EXTREMITY Right 03/02/2018   Procedure: RIGHT LEG DEBRIDEMENT AND PLACE VAC;  Surgeon: [name], MD;  Location: [facility];  Service: Orthopedics;  Laterality: Right;   I & D EXTREMITY Right 03/04/2018   Procedure: REPEAT IRRIGATION AND DEBRIDEMENT RIGHT LEG, PLACE WOUND VAC;  Surgeon: [name], MD;  Location: [facility];  Service: Orthopedics;  Laterality: Right;   I & D EXTREMITY Right 03/30/2018   Procedure: IRRIGATION AND DEBRIDEMENT RIGHT LEG, APPLY WOUND VAC;  Surgeon: [name], MD;  Location: [facility];  Service: Orthopedics;  Laterality: Right;   I & D EXTREMITY Right 05/27/2018   Procedure: RIGHT LOWER LEG DEBRIDEMENT,  SKIN GRAFT, VAC PLACEMENT;  Surgeon: [name], MD;  Location: [facility];  Service: Orthopedics;  Laterality: Right;  RIGHT LOWER LEG DEBRIDEMENT,  SKIN GRAFT, VAC PLACEMENT   renal calculi  12/1997   SVT with induction of anesthesia   right knee arthroscopy     SKIN SPLIT GRAFT Right 03/04/2018   Procedure: POSSIBLE SPLIT THICKNESS SKIN GRAFT;  Surgeon: [name], MD;  Location: [facility];  Service: Orthopedics;  Laterality: Right;   SKIN SPLIT GRAFT Right 04/01/2018   Procedure: REPEAT IRRIGATION AND DEBRIDEMENT RIGHT LEG, APPLY SPLIT THICKNESS SKIN GRAFT;  Surgeon: [name], MD;   Location: [facility];  Service: Orthopedics;  Laterality: Right;   TONSILLECTOMY AND ADENOIDECTOMY       OB History   No obstetric history on file.     Family History  Problem Relation Age of Onset   Colon cancer Mother    Prostate cancer Father    Colon cancer Father    Diabetes Maternal Aunt    Breast cancer Maternal Aunt    Diabetes Maternal Uncle    Diabetes Paternal Aunt    Stroke Paternal Aunt        > 71   Heart disease Paternal Aunt    Diabetes Paternal Uncle    Breast cancer Maternal Aunt         X 2   Breast cancer Cousin     Social  History   Tobacco Use   Smoking status: Never   Smokeless tobacco: Never  Vaping Use   Vaping Use: Never used  Substance Use Topics   Alcohol use: No    Alcohol/week: 0.0 standard drinks   Drug use: No    Home Medications Prior to Admission medications   Medication Sig Start Date End Date Taking? Authorizing Provider  acetaminophen (TYLENOL) 325 MG tablet Take 2 tablets (650 mg total) by mouth every 6 (six) hours as needed for moderate pain (or Fever >/= 101). 11/16/18  Yes Arrien, Jimmy Picket, MD  allopurinol (ZYLOPRIM) 100 MG tablet Take 2 tablets (200 mg total) by mouth daily. 06/27/20  Yes Burns, Claudina Lick, MD  apixaban (ELIQUIS) 5 MG TABS tablet Take 5 mg by mouth 2 (two) times daily.   Yes [provider]  Budesonide (PULMICORT FLEXHALER) 90 MCG/ACT inhaler Inhale 1 puff into the lungs 2 (two) times daily.   Yes [provider]  budesonide-formoterol (SYMBICORT) 160-4.5 MCG/ACT inhaler one - two inhalations every 12 hours; gargle and spit after use Patient taking differently: Inhale 1-2 puffs into the lungs every 12 (twelve) hours. 02/19/15  Yes Burns, Claudina Lick, MD  calcium carbonate (TUMS - DOSED IN MG ELEMENTAL CALCIUM) 500 MG chewable tablet Chew 2 tablets by mouth daily as needed for indigestion or heartburn.   Yes [provider]  carvedilol (COREG) 12.5 MG tablet TAKE 1 TABLET(12.5 MG) BY MOUTH  TWICE DAILY WITH A MEAL Patient taking differently: Take 12.5 mg by mouth 2 (two) times daily with a meal. 09/16/20  Yes Burns, Claudina Lick, MD  cholecalciferol (VITAMIN D3) 25 MCG (1000 UNIT) tablet Take 1 tablet (1,000 Units total) by mouth daily. 09/16/20  Yes Burns, Claudina Lick, MD  colchicine 0.6 MG tablet Take 2 tabs po at onset of gout flare, then one hour later take 1 tab Patient taking differently: Take 1.2 mg by mouth daily as needed (gout flare). 06/28/20  Yes Burns, Claudina Lick, MD  cyanocobalamin (,VITAMIN B-12,) 1000 MCG/ML injection INJECT 1ML INTO MUSCLE EVERY 30 DAYS Patient taking differently: Inject 1,000 mcg into the skin every 30 (thirty) days. 07/31/20  Yes Burns, Claudina Lick, MD  diclofenac sodium (VOLTAREN) 1 % GEL Apply 2 g topically 4 (four) times daily. Patient taking differently: Apply 2 g topically 4 (four) times daily as needed (dry skin, rash). 03/20/16  Yes Marcial Pacas, MD  eszopiclone (LUNESTA) 1 MG TABS tablet Take 1 tablet (1 mg total) by mouth at bedtime as needed for sleep. Take immediately before bedtime Patient taking differently: Take 1 mg by mouth at bedtime as needed for sleep. 10/02/20 11/15/20 Yes Olalere, Adewale A, MD  famotidine (PEPCID) 20 MG tablet Take 1 tablet (20 mg total) by mouth 2 (two) times daily. 11/11/20  Yes Burns, Claudina Lick, MD  FEROSUL 325 (65 Fe) MG tablet TAKE 1 TABLET BY MOUTH EVERY MONDAY, [place], AND FRIDAY Patient taking differently: Take 325 mg by mouth every Monday, Wednesday, and Friday. 10/17/20  Yes Burns, Claudina Lick, MD  folic acid (FOLVITE) 115 MCG tablet Take 1 tablet (400 mcg total) by mouth at bedtime. Take 400 mcg by mouth at bedtime. Patient taking differently: Take 400 mcg by mouth at bedtime. 03/12/20  Yes Burns, Claudina Lick, MD  gabapentin (NEURONTIN) 100 MG capsule TAKE 1 CAPSULE(100 MG) BY MOUTH THREE TIMES DAILY Patient taking differently: Take 100 mg by mouth 3 (three) times daily. 09/16/20  Yes [name], MD  hydrOXYzine (ATARAX/VISTARIL)  25 MG tablet Take 1 tablet (25 mg total) by mouth 3 (three) times daily as needed for itching. 03/01/19  Yes Fair, Marin Shutter, MD  montelukast (SINGULAIR) 10 MG tablet Take 1 tablet (10 mg total) by mouth at bedtime. 03/12/20  Yes Burns, Claudina Lick, MD  Nutritional Supplements (,FEEDING SUPPLEMENT, PROSOURCE PLUS) liquid Take 30 mLs by mouth 2 (two) times daily between meals. 05/15/20  Yes Antonieta Pert, MD  Oxcarbazepine (TRILEPTAL) 300 MG tablet Take 1 tablet (300 mg total) by mouth 2 (two) times daily. 06/03/20  Yes Burns, Claudina Lick, MD  oxyCODONE (OXY IR/ROXICODONE) 5 MG immediate release tablet Take 1 tablet (5 mg total) by mouth every 6 (six) hours as needed for severe pain (chronic knee and back pain). 10/28/20  Yes Burns, Claudina Lick, MD  polyethylene glycol (MIRALAX / GLYCOLAX) 17 g packet Take 17 g by mouth daily. Patient taking differently: Take 17 g by mouth daily as needed for mild constipation. 03/02/19  Yes Fair, Marin Shutter, MD  potassium chloride SA (KLOR-CON) 20 MEQ tablet Take 20 mEq by mouth 3 (three) times daily. 11/09/20  Yes [provider]  senna-docusate (SENOKOT-S) 8.6-50 MG tablet Take 1 tablet by mouth at bedtime as needed for mild constipation. 03/01/19  Yes Fair, Marin Shutter, MD  simvastatin (ZOCOR) 40 MG tablet Take 1 tablet (40 mg total) by mouth at bedtime. 05/06/20  Yes Burns, Claudina Lick, MD  spironolactone (ALDACTONE) 25 MG tablet TAKE 1 TABLET(25 MG) BY MOUTH DAILY Patient taking differently: Take 25 mg by mouth daily. 10/28/20  Yes Burns, Claudina Lick, MD  torsemide (DEMADEX) 20 MG tablet Take 3 tablets (60 mg total) by mouth 2 (two) times daily. May take an additional tablet as directed by CHF clinic. 09/03/20 12/02/20 Yes [name], Maricela Bo, FNP  VENTOLIN HFA 108 (90 Base) MCG/ACT inhaler Inhale 2 puffs into the lungs every 4 (four) hours as needed for wheezing. 11/13/19  Yes [provider]  Alcohol Swabs (B-D SINGLE USE SWABS REGULAR) PADS UAD   E11.9 10/14/20   [name], MD   Blood Glucose Calibration (TRUE METRIX LEVEL 1) Low SOLN UAD  E11.9 10/14/20   [name], MD    Allergies    Penicillins, Shellfish allergy, Sulfonamide derivatives, Hydrochlorothiazide w-triamterene, Lotensin [benazepril hcl], Dipyridamole, Estrogens, Hydrochlorothiazide, Latex, Metronidazole, Other, Spironolactone, Sulfa antibiotics, Torsemide, Valsartan, and Mustard [allyl isothiocyanate]  Review of Systems   Review of Systems  Constitutional:  Positive for fatigue. Negative for chills, diaphoresis and fever.  Respiratory:  Positive for shortness of breath. Negative for cough, sputum production, chest tightness and wheezing.   Cardiovascular:  Positive for leg swelling. Negative for chest pain and palpitations.  Gastrointestinal:  Negative for abdominal pain, constipation, diarrhea, nausea and vomiting.  Genitourinary:  Negative for flank pain.  Musculoskeletal:  Negative for back pain and neck pain.  Skin:  Negative for rash and wound.  Neurological:  Negative for weakness, light-headedness, numbness and headaches.  Psychiatric/Behavioral:  Negative for agitation.   All other systems reviewed and are negative.  Physical Exam Updated Vital Signs BP 111/71   Pulse 64   Temp 98.6 F (37 C) (Oral)   Resp 14   Ht 5\' 4"  (1.626 m)   Wt (!) 163.3 kg   SpO2 96%   BMI 61.79 kg/m   Physical Exam Vitals and nursing note reviewed.  Constitutional:      General: She is not in acute distress.    Appearance: She is well-developed. She  is not ill-appearing, toxic-appearing or diaphoretic.  HENT:     Head: Normocephalic and atraumatic.  Eyes:     Conjunctiva/sclera: Conjunctivae normal.     Pupils: Pupils are equal, round, and reactive to light.  Cardiovascular:     Rate and Rhythm: Normal rate and regular rhythm.     Heart sounds: No murmur heard. Pulmonary:     Effort: Pulmonary effort is normal. Tachypnea present. No respiratory distress.     Breath sounds: Rales present. No  wheezing or rhonchi.  Chest:     Chest wall: No tenderness.  Abdominal:     Palpations: Abdomen is soft.     Tenderness: There is no abdominal tenderness.  Musculoskeletal:     Cervical back: Neck supple.     Right lower leg: No tenderness. Edema present.     Left lower leg: No tenderness. Edema present.  Skin:    General: Skin is warm and dry.     Capillary Refill: Capillary refill takes less than 2 seconds.     Findings: No erythema.  Neurological:     General: No focal deficit present.     Mental Status: She is alert.  Psychiatric:        Mood and Affect: Mood normal.    ED Results / Procedures / Treatments   Labs (all labs ordered are listed, but only abnormal results are displayed) Labs Reviewed  BASIC METABOLIC PANEL - Abnormal; Notable for the following components:      Result Value   CO2 33 (*)    Glucose, Bld 130 (*)    Creatinine, Ser 1.65 (*)    GFR, Estimated 33 (*)    All other components within normal limits  CBC WITH DIFFERENTIAL/PLATELET - Abnormal; Notable for the following components:   MCV 103.0 (*)    MCHC 29.8 (*)    RDW 17.6 (*)    nRBC 0.8 (*)    All other components within normal limits  BRAIN NATRIURETIC PEPTIDE - Abnormal; Notable for the following components:   B Natriuretic Peptide 129.4 (*)    All other components within normal limits  I-STAT ARTERIAL BLOOD GAS, ED - Abnormal; Notable for the following components:   pH, Arterial 7.290 (*)    pCO2 arterial 79.9 (*)    pO2, Arterial 69 (*)    Bicarbonate 38.4 (*)    TCO2 41 (*)    Acid-Base Excess 9.0 (*)    All other components within normal limits  RESP PANEL BY RT-PCR (FLU A&B, COVID) ARPGX2  TSH  AMMONIA  HIV ANTIBODY (ROUTINE TESTING W REFLEX)  BASIC METABOLIC PANEL  TROPONIN I (HIGH SENSITIVITY)  TROPONIN I (HIGH SENSITIVITY)    EKG EKG Interpretation  Date/Time:  Friday November 15 2020 14:22:11 EDT Ventricular Rate:  80 PR Interval:  165 QRS Duration: 97 QT  Interval:  440 QTC Calculation: 508 R Axis:   12 Text Interpretation: Sinus rhythm Prolonged QT interval When compared to prior, longer QTc. No STEMI Confirmed by Antony Blackbird [phone]) on 11/15/2020 4:48:55 PM  Radiology DG Chest 2 View  Result Date: 11/15/2020 CLINICAL DATA:  Shortness of breath, chest pain and edema. EXAM: CHEST - 2 VIEW COMPARISON:  May 07, 2020 FINDINGS: Low lung volumes are seen. There is no evidence of acute infiltrate, pleural effusion or pneumothorax. The cardiac silhouette is mildly enlarged. Mild stable prominence of the bilateral perihilar pulmonary vasculature is noted. Moderate severity calcification of the aortic arch is seen. Multilevel degenerative changes seen throughout  the thoracic spine. IMPRESSION: Stable cardiomegaly without acute or active cardiopulmonary disease. Electronically Signed   By: Virgina Norfolk M.D.   On: 11/15/2020 15:46    Procedures Procedures   Medications Ordered in ED Medications  furosemide (LASIX) injection 80 mg (80 mg Intravenous Given 11/15/20 1742)    ED Course  I have reviewed the triage vital signs and the nursing notes.  Pertinent labs & imaging results that were available during my care of the patient were reviewed by me and considered in my medical decision making (see chart for details).    MDM Rules/Calculators/A&P                           April [name] is a 70 y.o. female with a past medical history significant for CAD, CHF, CKD, hypertension, hyperlipidemia, GERD, diabetes, and asthma who presents from her heart failure cardiology office visit where she was found to be hypoxic with worsening shortness of breath and edema.  Patient does not take oxygen at home and was found to have oxygen saturations in the 80s prompting her to be sent over for evaluation and likely admission.  She denies chest pain but reports her shortness with has been worsening as has her peripheral edema.  Does report she has had some  increase in urination but otherwise no nausea or vomiting.  She denies any current fevers or chills but does report has had some cough.  She denies any other complaints or injuries.  On exam, patient is now on oxygen and oxygen saturations are in the mid 90s.  She does have rales in her lungs and chest and abdomen are nontender.  She has pulses in all extremities but does have very edematous legs bilaterally.  She reports this is worse than baseline.  Patient had work-up started from triage.  Patient was found to have a normal troponin initially which will be trended.  TSH and ammonia normal.  COVID and flu negative.  BNP is elevated compared her baseline.  Otherwise her metabolic panel shows slightly more elevated creatinine and CBC does not show anemia or leukocytosis.  Chest x-ray shows stable cardiomegaly with no evidence of pneumonia.  Clinically she does have rales on exam.  Cardiology was called who will come see patient to discuss admission.  7:38 PM Cardiology reports that they do feel she needs admission for diuresis and further management of the hypoxia and CHF exacerbation but feel she would be best served admitted to hospitalist service.  Medicine admission call placed       Final Clinical Impression(s) / ED Diagnoses Final diagnoses:  Acute on chronic congestive heart failure, unspecified heart failure type ([place])      Clinical Impression: 1. Acute on chronic congestive heart failure, unspecified heart failure type ([place])     Disposition: Admit  This note was prepared with assistance of Dragon voice recognition software. Occasional wrong-word or sound-a-like substitutions may have occurred due to the inherent limitations of voice recognition software.     April [name], Gwenyth Allegra, MD 11/15/20 [phone]

## 2020-11-15 NOTE — ED Triage Notes (Signed)
Pt here POV with c/o of SOB, Lower leg edema and chest pain. Pt was at an appt with Dr. Rutherford Nail who recommended her be seen in ED. Pt needing 2 liters O2 . Pt not on O2 at baseline. HX of CHF, taking eliquisis.

## 2020-11-15 NOTE — Consult Note (Addendum)
Advanced Heart Failure Team Consult Note   Primary Physician: [name], MD PCP-Cardiologist:  Dr. Haroldine Laws   Reason for Consultation: A/c Diastolic CHF   HPI:    April [name] Zettie Pho is seen today for evaluation of acute on chronic diastolic CHF at the request of Dr. Sherry Ruffing, Emergency Medicine.   April [name] is a 70 year old woman with history of morbid obesity Body mass index is 65.23 kg/m., DM2, HTN, HLD, mild-to- moderate CAD, asthma, and chronic dHF.   She underwent catheterization in (2/08), which showed 50-60% LAD lesion and 30% RCA lesion.  EF was 70%.  She had mild pulmonary hypertension, primarily due to high output state.  Her mean pulmonary artery pressure was 27, wedge pressure was 10. PVR was 1.80 Woods units. Her last echo was in 2017 and showed normal LVEF 65-70%, G1DD, RV normal.    Admitted 4/22 for a/c dCHF & acute hypoxic respiratory failure, with likely component of acute asthma exacerbation as well as for management of acute bilateral LE DVT w/ ? Superimposed cellulitis. Treated w/ IV abx and IV Lasix. Echo showed normal LVEF 60-65%. RV normal. She was diuresed and placed back on PO diuretics, switched from lasix 80 bid to torsemide 60 mg d/c. D/c wt 371 lb.    She saw pulmonology for OSA 10/02/20, sleep study ordered, low dose Lunesta added.   Presented to [facility] today for f/u.  She was too weak to stand for a weight, oxygen 77% on room air (not on home O20, up to 94% on 2L. She feels like she was going to pass out. She was lethargic. She is very sedentary at baseline w/ limited mobility due to marked obesity and knee/hip OA but previously able to work w/ home PT/OT. Last clinic visit volume stable. Appeared fluid overloaded and hypoxic. Concern for CO2 retention  Sent to ED. BNP 129. CXR w/ stable cardiomegaly without acute or active cardiopulmonary disease. COVID and Flu negative.    Review of Systems: [y] = yes, [ ]  = no   General: Weight gain [ ] ; Weight loss  [ ] ; Anorexia [ ] ; Fatigue [ y]; Fever [ ] ; Chills [ ] ; Weakness [ ]   Cardiac: Chest pain/pressure [ ] ; Resting SOB [ Y]; Exertional SOB [ Y]; Orthopnea [ ] ; Pedal Edema Blue.Reese ]; Palpitations [ ] ; Syncope [ ] ; Presyncope [ ] ; Paroxysmal nocturnal dyspnea[ ]   Pulmonary: Cough [ y]; Wheezing[ ] ; Hemoptysis[ ] ; Sputum [ ] ; Snoring [ ]   GI: Vomiting[ ] ; Dysphagia[ ] ; Melena[ ] ; Hematochezia [ ] ; Heartburn[ ] ; Abdominal pain [ ] ; Constipation [ ] ; Diarrhea [ ] ; BRBPR [ ]   GU: Hematuria[ ] ; Dysuria [ ] ; Nocturia[ ]   Vascular: Pain in legs with walking [ ] ; Pain in feet with lying flat [ ] ; Non-healing sores [ ] ; Stroke [ ] ; TIA [ ] ; Slurred speech [ ] ;  Neuro: Headaches[ ] ; Vertigo[ ] ; Seizures[ ] ; Paresthesias[ ] ;Blurred vision [ ] ; Diplopia [ ] ; Vision changes [ ]   Ortho/Skin: Arthritis Blue.Reese ]; Joint pain Blue.Reese ]; Muscle pain [ ] ; Joint swelling [ ] ; Back Pain [ ] ; Rash [ ]   Psych: Depression[ ] ; Anxiety[ ]   Heme: Bleeding problems [ ] ; Clotting disorders [ ] ; Anemia [ ]   Endocrine: Diabetes [ y]; Thyroid dysfunction[ ]   Home Medications Prior to Admission medications   Medication Sig Start Date End Date Taking? Authorizing Provider  acetaminophen (TYLENOL) 325 MG tablet Take 2 tablets (650 mg total) by mouth every 6 (six) hours as  needed for moderate pain (or Fever >/= 101). 11/16/18  Yes Arrien, Jimmy Picket, MD  allopurinol (ZYLOPRIM) 100 MG tablet Take 2 tablets (200 mg total) by mouth daily. 06/27/20  Yes Burns, Claudina Lick, MD  apixaban (ELIQUIS) 5 MG TABS tablet Take 5 mg by mouth 2 (two) times daily.   Yes [provider]  Budesonide (PULMICORT FLEXHALER) 90 MCG/ACT inhaler Inhale 1 puff into the lungs 2 (two) times daily.   Yes [provider]  budesonide-formoterol (SYMBICORT) 160-4.5 MCG/ACT inhaler one - two inhalations every 12 hours; gargle and spit after use Patient taking differently: Inhale 1-2 puffs into the lungs every 12 (twelve) hours. 02/19/15  Yes Burns, Claudina Lick, MD   calcium carbonate (TUMS - DOSED IN MG ELEMENTAL CALCIUM) 500 MG chewable tablet Chew 2 tablets by mouth daily as needed for indigestion or heartburn.   Yes [provider]  carvedilol (COREG) 12.5 MG tablet TAKE 1 TABLET(12.5 MG) BY MOUTH TWICE DAILY WITH A MEAL Patient taking differently: Take 12.5 mg by mouth 2 (two) times daily with a meal. 09/16/20  Yes Burns, Claudina Lick, MD  cholecalciferol (VITAMIN D3) 25 MCG (1000 UNIT) tablet Take 1 tablet (1,000 Units total) by mouth daily. 09/16/20  Yes Burns, Claudina Lick, MD  colchicine 0.6 MG tablet Take 2 tabs po at onset of gout flare, then one hour later take 1 tab Patient taking differently: Take 1.2 mg by mouth daily as needed (gout flare). 06/28/20  Yes Burns, Claudina Lick, MD  cyanocobalamin (,VITAMIN B-12,) 1000 MCG/ML injection INJECT 1ML INTO MUSCLE EVERY 30 DAYS Patient taking differently: Inject 1,000 mcg into the skin every 30 (thirty) days. 07/31/20  Yes Burns, Claudina Lick, MD  diclofenac sodium (VOLTAREN) 1 % GEL Apply 2 g topically 4 (four) times daily. Patient taking differently: Apply 2 g topically 4 (four) times daily as needed (dry skin, rash). 03/20/16  Yes Marcial Pacas, MD  eszopiclone (LUNESTA) 1 MG TABS tablet Take 1 tablet (1 mg total) by mouth at bedtime as needed for sleep. Take immediately before bedtime Patient taking differently: Take 1 mg by mouth at bedtime as needed for sleep. 10/02/20 11/15/20 Yes Olalere, Adewale A, MD  famotidine (PEPCID) 20 MG tablet Take 1 tablet (20 mg total) by mouth 2 (two) times daily. 11/11/20  Yes Burns, Claudina Lick, MD  FEROSUL 325 (65 Fe) MG tablet TAKE 1 TABLET BY MOUTH EVERY MONDAY, [place], AND FRIDAY Patient taking differently: Take 325 mg by mouth every Monday, Wednesday, and Friday. 10/17/20  Yes Burns, Claudina Lick, MD  folic acid (FOLVITE) 263 MCG tablet Take 1 tablet (400 mcg total) by mouth at bedtime. Take 400 mcg by mouth at bedtime. Patient taking differently: Take 400 mcg by mouth at bedtime. 03/12/20   Yes Burns, Claudina Lick, MD  gabapentin (NEURONTIN) 100 MG capsule TAKE 1 CAPSULE(100 MG) BY MOUTH THREE TIMES DAILY Patient taking differently: Take 100 mg by mouth 3 (three) times daily. 09/16/20  Yes Burns, Claudina Lick, MD  hydrOXYzine (ATARAX/VISTARIL) 25 MG tablet Take 1 tablet (25 mg total) by mouth 3 (three) times daily as needed for itching. 03/01/19  Yes Fair, Marin Shutter, MD  montelukast (SINGULAIR) 10 MG tablet Take 1 tablet (10 mg total) by mouth at bedtime. 03/12/20  Yes Burns, Claudina Lick, MD  Nutritional Supplements (,FEEDING SUPPLEMENT, PROSOURCE PLUS) liquid Take 30 mLs by mouth 2 (two) times daily between meals. 05/15/20  Yes Antonieta Pert, MD  Oxcarbazepine (TRILEPTAL) 300 MG tablet Take 1 tablet (300  mg total) by mouth 2 (two) times daily. 06/03/20  Yes Burns, Claudina Lick, MD  oxyCODONE (OXY IR/ROXICODONE) 5 MG immediate release tablet Take 1 tablet (5 mg total) by mouth every 6 (six) hours as needed for severe pain (chronic knee and back pain). 10/28/20  Yes Burns, Claudina Lick, MD  polyethylene glycol (MIRALAX / GLYCOLAX) 17 g packet Take 17 g by mouth daily. Patient taking differently: Take 17 g by mouth daily as needed for mild constipation. 03/02/19  Yes Fair, Marin Shutter, MD  potassium chloride SA (KLOR-CON) 20 MEQ tablet Take 20 mEq by mouth 3 (three) times daily. 11/09/20  Yes [provider]  senna-docusate (SENOKOT-S) 8.6-50 MG tablet Take 1 tablet by mouth at bedtime as needed for mild constipation. 03/01/19  Yes Fair, Marin Shutter, MD  simvastatin (ZOCOR) 40 MG tablet Take 1 tablet (40 mg total) by mouth at bedtime. 05/06/20  Yes Burns, Claudina Lick, MD  spironolactone (ALDACTONE) 25 MG tablet TAKE 1 TABLET(25 MG) BY MOUTH DAILY Patient taking differently: Take 25 mg by mouth daily. 10/28/20  Yes Burns, Claudina Lick, MD  torsemide (DEMADEX) 20 MG tablet Take 3 tablets (60 mg total) by mouth 2 (two) times daily. May take an additional tablet as directed by CHF clinic. 09/03/20 12/02/20 Yes [name], Maricela Bo, FNP   VENTOLIN HFA 108 (90 Base) MCG/ACT inhaler Inhale 2 puffs into the lungs every 4 (four) hours as needed for wheezing. 11/13/19  Yes [provider]  Alcohol Swabs (B-D SINGLE USE SWABS REGULAR) PADS UAD   E11.9 10/14/20   [name], MD  Blood Glucose Calibration (TRUE METRIX LEVEL 1) Low SOLN UAD  E11.9 10/14/20   [name], MD    Past Medical History: Past Medical History:  Diagnosis Date   Anemia    Asthma    CAD (coronary artery disease)    Carpal tunnel syndrome    Cellulitis of both lower extremities 04/11/2015   CHF (congestive heart failure) ([place])    Chronic kidney disease    [facility]- followed by [facility]   Colon polyp, hyperplastic 2409 & 7353   Complication of anesthesia 1999   svt with renal calculi surgery, no problems since   CTS (carpal tunnel syndrome)    bilateral   Diabetes mellitus    Eczema    GERD (gastroesophageal reflux disease)    History of kidney stones 1999   Hyperlipidemia    Hypertension    Leg ulcer ([place]) 04/24/2015   Right lateral leg No evidence of an infection Monitor closely Keep edema controlled    Leg ulcer ([place])    right lower   Meralgia paresthetica    Dr. [name]   Morbid obesity ([place])    Neuropathy    toes and legs   Osteoarthrosis, unspecified whether generalized or localized, lower leg    knee   PUD (peptic ulcer disease)    Shortness of breath dyspnea    with exertion   Sleep apnea    per progress note 02/25/2018   Type II or unspecified type diabetes mellitus without mention of complication, not stated as uncontrolled    Unspecified hereditary and idiopathic peripheral neuropathy    Urticaria    Vitamin B12 deficiency    Wears glasses    Wound cellulitis    right upper leg, healing well    Past Surgical History: Past Surgical History:  Procedure Laterality Date   ABDOMINAL HYSTERECTOMY     BREAST BIOPSY Right 07/08/2018  CARDIAC CATHETERIZATION  2002   non obstructive disease   colonoscopy with  polypectomy  2007 & 2012    hyperplastic ;Dr Watt Climes   COLONOSCOPY WITH PROPOFOL N/A 06/04/2015   Procedure: COLONOSCOPY WITH PROPOFOL;  Surgeon: Jerene Bears, MD;  Location: [facility];  Service: Gastroenterology;  Laterality: N/A;   DEBRIDEMENT LEG Right 03/02/2018   WOUND VAC APPLIED   DEBRIDEMENT LEG Right 05/27/2018   RIGHT LOWER LEG DEBRIDEMENT,  SKIN GRAFT, VAC PLACEMENT    DILATION AND CURETTAGE OF UTERUS     multiple   HEMORRHOID SURGERY     I & D EXTREMITY Right 03/02/2018   Procedure: RIGHT LEG DEBRIDEMENT AND PLACE VAC;  Surgeon: [name], MD;  Location: [place];  Service: Orthopedics;  Laterality: Right;   I & D EXTREMITY Right 03/04/2018   Procedure: REPEAT IRRIGATION AND DEBRIDEMENT RIGHT LEG, PLACE WOUND VAC;  Surgeon: [name], MD;  Location: [place];  Service: Orthopedics;  Laterality: Right;   I & D EXTREMITY Right 03/30/2018   Procedure: IRRIGATION AND DEBRIDEMENT RIGHT LEG, APPLY WOUND VAC;  Surgeon: [name], MD;  Location: [place];  Service: Orthopedics;  Laterality: Right;   I & D EXTREMITY Right 05/27/2018   Procedure: RIGHT LOWER LEG DEBRIDEMENT,  SKIN GRAFT, VAC PLACEMENT;  Surgeon: [name], MD;  Location: [place];  Service: Orthopedics;  Laterality: Right;  RIGHT LOWER LEG DEBRIDEMENT,  SKIN GRAFT, VAC PLACEMENT   renal calculi  12/1997   SVT with induction of anesthesia   right knee arthroscopy     SKIN SPLIT GRAFT Right 03/04/2018   Procedure: POSSIBLE SPLIT THICKNESS SKIN GRAFT;  Surgeon: [name], MD;  Location: [place];  Service: Orthopedics;  Laterality: Right;   SKIN SPLIT GRAFT Right 04/01/2018   Procedure: REPEAT IRRIGATION AND DEBRIDEMENT RIGHT LEG, APPLY SPLIT THICKNESS SKIN GRAFT;  Surgeon: [name], MD;  Location: [place];  Service: Orthopedics;  Laterality: Right;   TONSILLECTOMY AND ADENOIDECTOMY      Family History: Family History  Problem Relation Age of Onset   Colon cancer Mother    Prostate cancer Father    Colon  cancer Father    Diabetes Maternal Aunt    Breast cancer Maternal Aunt    Diabetes Maternal Uncle    Diabetes Paternal Aunt    Stroke Paternal Aunt        > 14   Heart disease Paternal Aunt    Diabetes Paternal Uncle    Breast cancer Maternal Aunt         X 2   Breast cancer Cousin     Social History: Social History   Socioeconomic History   Marital status: Married    Spouse name: Dwight   Number of children: 1   Years of education: BS   Highest education level: Not on file  Occupational History   Occupation: Disabled    Employer: RETIRED  Tobacco Use   Smoking status: Never   Smokeless tobacco: Never  Vaping Use   Vaping Use: Never used  Substance and Sexual Activity   Alcohol use: No    Alcohol/week: 0.0 standard drinks   Drug use: No   Sexual activity: Not on file  Other Topics Concern   Not on file  Social History Narrative   Patient lives at home with her husband [name] Patient is retired and has a Conservation officer, nature.    Caffeine - some times.   Right  handed.   Social Determinants of Health   Financial Resource Strain: Not on file  Food Insecurity: Not on file  Transportation Needs: Not on file  Physical Activity: Not on file  Stress: Not on file  Social Connections: Not on file    Allergies:  Allergies  Allergen Reactions   Penicillins Rash and Other (See Comments)    She was told not to take it anymore. Has patient had a PCN reaction causing immediate rash, facial/tongue/throat swelling, SOB or lightheadedness with hypotension: Yes Has patient had a PCN reaction causing severe rash involving mucus membranes or skin necrosis: No Has patient had a PCN reaction that required hospitalization: No Has patient had a PCN reaction occurring within the last 10 years: No If all of the above answers are "NO", then may proceed with Cephalosporin use.'   Shellfish Allergy Anaphylaxis   Sulfonamide Derivatives Anaphylaxis and Other (See Comments)    Hydrochlorothiazide W-Triamterene Other (See Comments)    Hypokalemia   Lotensin [Benazepril Hcl] Hives   Dipyridamole Other (See Comments)    Unknown reaction   Estrogens Other (See Comments)    Unknown reaction   Hydrochlorothiazide Other (See Comments)    Unknown reaction   Latex Other (See Comments)    Unknown reaction - stated previously during surgery gloves had to be changed, but unsure if it is still an allergy or not.    Metronidazole Other (See Comments)    Unknown reaction   Other Itching    PICKLES   Spironolactone Other (See Comments)    UNSPECIFIED > "kidney problems"   Sulfa Antibiotics Other (See Comments)    Unknown reaction   Torsemide Other (See Comments)    Unknown reaction   Valsartan Other (See Comments)    Unknown reaction   Mustard [Allyl Isothiocyanate] Itching    Objective:    Vital Signs:   Temp:  [98.6 F (37 C)] 98.6 F (37 C) (10/14 1241) Pulse Rate:  [68-85] 68 (10/14 1545) Resp:  [15-24] 15 (10/14 1545) BP: (129-156)/(72-85) 130/74 (10/14 1545) SpO2:  [99 %-100 %] 99 % (10/14 1545) Weight:  [163.3 kg] 163.3 kg (10/14 1247)    Weight change: Filed Weights   11/15/20 1247  Weight: (!) 163.3 kg    Intake/Output:  No intake or output data in the 24 hours ending 11/15/20 1604    Physical Exam    General:  super morbidly obese. SOB w/ exertion.  HEENT: normal Neck: supple. Thick neck, JVP not well visualized . Carotids 2+ bilat; no bruits. No lymphadenopathy or thyromegaly appreciated. Cor: PMI nondisplaced. Regular rate & rhythm. No rubs, gallops or murmurs. Lungs: clear Abdomen: soft, nontender, nondistended. No hepatosplenomegaly. No bruits or masses. Good bowel sounds. Extremities: no cyanosis, clubbing, rash, edema Neuro: alert & orientedx3, cranial nerves grossly intact. moves all 4 extremities w/o difficulty. Affect pleasant   Telemetry   NSR 80s   EKG    NAR 80 bpm   Labs   Basic Metabolic Panel: Recent Labs   Lab 11/15/20 1247  NA 139  K 4.6  CL 98  CO2 33*  GLUCOSE 130*  BUN 21  CREATININE 1.65*  CALCIUM 9.0    Liver Function Tests: No results for input(s): AST, ALT, ALKPHOS, BILITOT, PROT, ALBUMIN in the last 168 hours. No results for input(s): LIPASE, AMYLASE in the last 168 hours. No results for input(s): AMMONIA in the last 168 hours.  CBC: Recent Labs  Lab 11/15/20 1247  WBC 6.0  NEUTROABS 4.6  HGB 12.3  HCT 41.3  MCV 103.0*  PLT 166    Cardiac Enzymes: No results for input(s): CKTOTAL, CKMB, CKMBINDEX, TROPONINI in the last 168 hours.  BNP: BNP (last 3 results) Recent Labs    06/11/20 0955 08/14/20 1150 11/15/20 1247  BNP 68.3 53.1 129.4*    ProBNP (last 3 results) No results for input(s): PROBNP in the last 8760 hours.   CBG: No results for input(s): GLUCAP in the last 168 hours.  Coagulation Studies: No results for input(s): LABPROT, INR in the last 72 hours.   Imaging   DG Chest 2 View  Result Date: 11/15/2020 CLINICAL DATA:  Shortness of breath, chest pain and edema. EXAM: CHEST - 2 VIEW COMPARISON:  May 07, 2020 FINDINGS: Low lung volumes are seen. There is no evidence of acute infiltrate, pleural effusion or pneumothorax. The cardiac silhouette is mildly enlarged. Mild stable prominence of the bilateral perihilar pulmonary vasculature is noted. Moderate severity calcification of the aortic arch is seen. Multilevel degenerative changes seen throughout the thoracic spine. IMPRESSION: Stable cardiomegaly without acute or active cardiopulmonary disease. Electronically Signed   By: Virgina Norfolk M.D.   On: 11/15/2020 15:46     Medications:     Current Medications:   Infusions:     Assessment/Plan   1. Chronic Hypoxic Respiratory Failure - Multifactorial 2/2. Suspected OSA/ OHS + chronic diastolic heart failure - Sleep study has been scheduled by Pulmonary. - Needs wt loss but mobility limited by severe knee/hip OA.  - Suspect  current symptoms respiratory-driven.   2. Acute on Chronic Diastolic Heart Failure  - RHC 2008 c/w mild pulmonary hypertension, primarily due to high output state.  Her mean pulmonary artery pressure was 27, wedge pressure was 10. PVR was 1.80 Woods units. - Echo (2017): LVEF 65-70%, G1DD, RV normal - Recent admit for a/c dCHF. Volume improved w/ IV Lasix.  - Echo (4/22): LVEF 60-65%, G2DD  - NYHA III, functional status difficult due to body habitus, asthma/chronic resp failure, and physical deconditioning. Exam difficult for volume, but she looks volume up today. - With lethargy and hypoxia, sent to ED - Obesity limits exam but suspect fluid overload, diurese w/ IV Lasix 80 mg bid  - Continue spiro 25 mg daily.  - Continue carvedilol 12.5 mg bid. - No SGLT2i given risk of GU infection given morbid obesity/ large panus .   3. Bilateral LE DVT (Diagnosed 4/22) - Confirmed by venous dopplers. Chest CT negative for PE. - Now on Eliquis. No bleeding issues.   4. Asthma  - No wheezing on exam.  - On Symbicort. - Followed by PCP.     5. CAD:  - LHC in 2008 showed 50-60% LAD lesion and 30% RCA lesion. - No s/s of angina.  - Continue ? blocker and statin. - No ASA given Eliquis for DVT tx.    6. Stage IIIb CKD - Baseline SCr ~1.5. - Followed by Dr. [name] at [facility]. - 1.7 on admit, follow w/ Diuresis.   Admit to Medicine Service. Cardiology will follow over the weekend.   Length of Stay: 0  Lyda Jester, PA-C  11/15/2020, 4:04 PM  Advanced Heart Failure Team Pager [phone] (M-F; 7a - 5p)  Please contact [facility] for night-coverage after hours (4p -7a ) and weekends on [email or web address]  Patient seen and examined with the above-signed Advanced Practice Provider and/or Housestaff. I personally reviewed laboratory data, imaging studies and relevant notes. I independently examined the patient and formulated the  important aspects of the plan. I have edited the note to reflect any of  my changes or salient points. I have personally discussed the plan with the patient and/or family.  Massively obese woman with h/o diastolic HF, OSA/OHS, CKD 3a, nonobstructive CAD. Presented to HF clinic today for routine visits. Was found to be markedly hypoxic (not on home O2) and lethargic concerning for hypercarbia. Was placed on O2 and sent to ER.   In ER, BNP 129 hstrop 9.  CBC Ok. ABG not obtained. CXR ok   General:  Chronically ill appearing and lethargic obese female  HEENT: normal Neck: supple. nJVP hard to see but appears elevated to jaw  Carotids 2+ bilat; no bruits. No lymphadenopathy or thryomegaly appreciated. Cor: PMI nonpalpable Regular rate & rhythm. No rubs, gallops or murmurs. Lungs: clear Abdomen: obese soft, nontender, nondistended.Good bowel sounds. Extremities: no cyanosis, clubbing, rash, 1+ edema Neuro: lethargic but arouses.  orientedx3, cranial nerves grossly intact. moves all 4 extremities w/o difficulty. Affect pleasant  Suspect presentation most c/w respiratory failure due to OSA/OHS and not HF. Exam difficult but suspect she has some R-sided HF. Would get ABG. Admit to IM for further eval. Check echo to better evaluate RV.. Will give 2 doses of IV lasix and assess response.   Glori Bickers, MD  4:59 PM

## 2020-11-15 NOTE — ED Provider Notes (Addendum)
Emergency Medicine Provider Triage Evaluation Note  April [name] , a 70 y.o. female  was evaluated in triage.  Pt complains of sob.  Review of Systems  Positive: Sob, fatigue, heart palpitation Negative: Fever, productive cough, hemoptysis  Physical Exam  BP (!) 156/85 (BP Location: Right Arm)   Pulse 80   Temp 98.6 F (37 C) (Oral)   Resp (!) 24   SpO2 99%  Gen:   Awake, no distress   Resp:  Normal effort  MSK:   Moves extremities without difficulty  Other:    Medical Decision Making  Medically screening exam initiated at 12:46 PM.  Appropriate orders placed.  April [name] was informed that the remainder of the evaluation will be completed by another provider, this initial triage assessment does not replace that evaluation, and the importance of remaining in the ED until their evaluation is complete.  Morbidly obese female with hx of PE on Eliquis here for SOB.  Was seen at Dr. Haroldine Laws office today for a check up but was found to be hypoxic needing supplemental O2, sent here for further evaluation.     2:20 PM DR. Bensihmon is concerned that pt is hypercapneic causing confusion. He request ABG done.  But please remove supplemental O2 for 15 minutes before obtain ABG.  Domenic Moras, PA-C 11/15/20 1249    Domenic Moras, PA-C 11/15/20 1420    Elnora Morrison, MD 11/20/20 1330

## 2020-11-15 NOTE — Progress Notes (Signed)
Pt placed on BiPAP per MD. Pt is resting comfortably. RT will cont to monitor.

## 2020-11-15 NOTE — Progress Notes (Signed)
Advanced Heart Failure Clinic Note   PCP: [name], MD PCP-Cardiologist: None  HF Cardiologist: Dr. Haroldine Laws  Reason for visit: f/u for chronic diastolic heart failure   HPI: [name] is a 70 year old woman with history of morbid obesity Body mass index is 65.23 kg/m., DM2, HTN, HLD, mild-to- moderate CAD, asthma, and chronic dHF.  She underwent catheterization in (2/08), which showed 50-60% LAD lesion and 30% RCA lesion.  EF was 70%.  She had mild pulmonary hypertension, primarily due to high output state.  Her mean pulmonary artery pressure was 27, wedge pressure was 10. PVR was 1.80 Woods units. Her last echo was in 2017 and showed normal LVEF 65-70%, G1DD, RV normal.    Admitted 4/22 for a/c dCHF & acute hypoxic respiratory failure, with likely component of acute asthma exacerbation as well as for management of acute bilateral LE DVT w/ ? Superimposed cellulitis. Treated w/ IV abx and IV Lasix. Echo showed normal LVEF 60-65%. RV normal. She was diuresed and placed back on PO diuretics, switched from lasix 80 bid to torsemide 60 mg d/c. D/c wt 371 lb.   She saw pulmonology for OSA 10/02/20, sleep study ordered, low dose Lunesta added.  Today she returns for HF follow up.  She is too weak to stand for a weight, oxygen 81% on room air, up to 94% on 2L. She feels like she is going to pass out. She is lethargic. She is very sedentary at baseline w/ limited mobility due to marked obesity and knee/hip OA but previously able to work w/ home PT/OT. Last clinic visit volume stable.  Cardiac Studies: - Echo 4/22: EF 60-65%. RV ok.   ROS: All systems reviewed and negative except as per HPI.   Past Medical History:  Diagnosis Date   Anemia    Asthma    CAD (coronary artery disease)    Carpal tunnel syndrome    Cellulitis of both lower extremities 04/11/2015   CHF (congestive heart failure) (HCC)    Chronic kidney disease    [facility]- followed by [facility]   Colon polyp,  hyperplastic 8676 & 1950   Complication of anesthesia 1999   svt with renal calculi surgery, no problems since   CTS (carpal tunnel syndrome)    bilateral   Diabetes mellitus    Eczema    GERD (gastroesophageal reflux disease)    History of kidney stones 1999   Hyperlipidemia    Hypertension    Leg ulcer ([place]) 04/24/2015   Right lateral leg No evidence of an infection Monitor closely Keep edema controlled    Leg ulcer (HCC)    right lower   Meralgia paresthetica    Dr. [name]   Morbid obesity ([place])    Neuropathy    toes and legs   Osteoarthrosis, unspecified whether generalized or localized, lower leg    knee   PUD (peptic ulcer disease)    Shortness of breath dyspnea    with exertion   Sleep apnea    per progress note 02/25/2018   Type II or unspecified type diabetes mellitus without mention of complication, not stated as uncontrolled    Unspecified hereditary and idiopathic peripheral neuropathy    Urticaria    Vitamin B12 deficiency    Wears glasses    Wound cellulitis    right upper leg, healing well   Current Outpatient Medications  Medication Sig Dispense Refill   acetaminophen (TYLENOL) 325 MG tablet Take 2 tablets (650 mg total) by mouth  every 6 (six) hours as needed for moderate pain (or Fever >/= 101). 20 tablet 0   Alcohol Swabs (B-D SINGLE USE SWABS REGULAR) PADS UAD   E11.9 100 each 3   allopurinol (ZYLOPRIM) 100 MG tablet Take 2 tablets (200 mg total) by mouth daily. 180 tablet 1   apixaban (ELIQUIS) 5 MG TABS tablet Take 5 mg by mouth 2 (two) times daily.     Blood Glucose Calibration (TRUE METRIX LEVEL 1) Low SOLN UAD  E11.9 1 each 3   Budesonide (PULMICORT FLEXHALER) 90 MCG/ACT inhaler Pulmicort Flexhaler 90 mcg/actuation breath activated     budesonide-formoterol (SYMBICORT) 160-4.5 MCG/ACT inhaler one - two inhalations every 12 hours; gargle and spit after use 1 Inhaler 4   calcium carbonate (TUMS - DOSED IN MG ELEMENTAL CALCIUM) 500 MG chewable tablet Chew  2 tablets by mouth daily as needed for indigestion or heartburn.     carvedilol (COREG) 12.5 MG tablet TAKE 1 TABLET(12.5 MG) BY MOUTH TWICE DAILY WITH A MEAL 180 tablet 0   cholecalciferol (VITAMIN D3) 25 MCG (1000 UNIT) tablet Take 1 tablet (1,000 Units total) by mouth daily. 90 tablet 1   Cholecalciferol 25 MCG (1000 UT) capsule cholecalciferol (vitamin D3) 25 mcg (1,000 unit) capsule  TAKE 1 CAPSULE BY MOUTH DAILY     colchicine 0.6 MG tablet Take 2 tabs po at onset of gout flare, then one hour later take 1 tab 30 tablet 1   cyanocobalamin (,VITAMIN B-12,) 1000 MCG/ML injection INJECT 1ML INTO MUSCLE EVERY 30 DAYS 10 mL 5   diclofenac sodium (VOLTAREN) 1 % GEL Apply 2 g topically 4 (four) times daily. 100 g 11   eszopiclone (LUNESTA) 1 MG TABS tablet Take 1 tablet (1 mg total) by mouth at bedtime as needed for sleep. Take immediately before bedtime 30 tablet 1   famotidine (PEPCID) 20 MG tablet Take 1 tablet (20 mg total) by mouth 2 (two) times daily. 180 tablet 3   FEROSUL 325 (65 Fe) MG tablet TAKE 1 TABLET BY MOUTH EVERY MONDAY, WEDNESDAY, AND FRIDAY 60 tablet 0   folic acid (FOLVITE) 332 MCG tablet Take 1 tablet (400 mcg total) by mouth at bedtime. Take 400 mcg by mouth at bedtime. (Patient taking differently: Take 400 mcg by mouth at bedtime.) 90 tablet 1   gabapentin (NEURONTIN) 100 MG capsule TAKE 1 CAPSULE(100 MG) BY MOUTH THREE TIMES DAILY 90 capsule 1   hydrocortisone 2.5 % cream hydrocortisone 2.5 % topical cream with perineal applicator     hydrOXYzine (ATARAX/VISTARIL) 25 MG tablet Take 1 tablet (25 mg total) by mouth 3 (three) times daily as needed for itching. 30 tablet 0   montelukast (SINGULAIR) 10 MG tablet Take 1 tablet (10 mg total) by mouth at bedtime. 30 tablet 5   Nutritional Supplements (,FEEDING SUPPLEMENT, PROSOURCE PLUS) liquid Take 30 mLs by mouth 2 (two) times daily between meals.     Oxcarbazepine (TRILEPTAL) 300 MG tablet Take 1 tablet (300 mg total) by mouth 2  (two) times daily. 180 tablet 1   oxyCODONE (OXY IR/ROXICODONE) 5 MG immediate release tablet Take 1 tablet (5 mg total) by mouth every 6 (six) hours as needed for severe pain (chronic knee and back pain). 30 tablet 0   polyethylene glycol (MIRALAX / GLYCOLAX) 17 g packet Take 17 g by mouth daily. (Patient taking differently: Take 17 g by mouth daily as needed.) 14 each 0   senna-docusate (SENOKOT-S) 8.6-50 MG tablet Take 1 tablet by mouth at  bedtime as needed for mild constipation. 30 tablet 0   simvastatin (ZOCOR) 40 MG tablet Take 1 tablet (40 mg total) by mouth at bedtime. 90 tablet 1   spironolactone (ALDACTONE) 25 MG tablet TAKE 1 TABLET(25 MG) BY MOUTH DAILY 90 tablet 0   torsemide (DEMADEX) 20 MG tablet Take 3 tablets (60 mg total) by mouth 2 (two) times daily. May take an additional tablet as directed by CHF clinic. 210 tablet 3   VENTOLIN HFA 108 (90 Base) MCG/ACT inhaler Inhale 2 puffs into the lungs every 4 (four) hours as needed for wheezing.     No current facility-administered medications for this encounter.   Allergies  Allergen Reactions   Penicillins Rash and Other (See Comments)    She was told not to take it anymore. Has patient had a PCN reaction causing immediate rash, facial/tongue/throat swelling, SOB or lightheadedness with hypotension: Yes Has patient had a PCN reaction causing severe rash involving mucus membranes or skin necrosis: No Has patient had a PCN reaction that required hospitalization: No Has patient had a PCN reaction occurring within the last 10 years: No If all of the above answers are "NO", then may proceed with Cephalosporin use.'   Shellfish Allergy Anaphylaxis   Sulfonamide Derivatives Anaphylaxis and Other (See Comments)   Hydrochlorothiazide W-Triamterene Other (See Comments)    Hypokalemia   Lotensin [Benazepril Hcl] Hives   Dipyridamole Other (See Comments)    Unknown reaction   Estrogens Other (See Comments)    Unknown reaction    Hydrochlorothiazide Other (See Comments)    Unknown reaction   Latex Other (See Comments)    Unknown reaction - stated previously during surgery gloves had to be changed, but unsure if it is still an allergy or not.    Metronidazole Other (See Comments)    Unknown reaction   Other Itching    PICKLES   Spironolactone Other (See Comments)    UNSPECIFIED > "kidney problems"   Sulfa Antibiotics Other (See Comments)    Unknown reaction   Torsemide Other (See Comments)    Unknown reaction   Valsartan Other (See Comments)    Unknown reaction   Mustard [Allyl Isothiocyanate] Itching   Social History   Socioeconomic History   Marital status: Married    Spouse name: Dwight   Number of children: 1   Years of education: BS   Highest education level: Not on file  Occupational History   Occupation: Disabled    Employer: RETIRED  Tobacco Use   Smoking status: Never   Smokeless tobacco: Never  Vaping Use   Vaping Use: Never used  Substance and Sexual Activity   Alcohol use: No    Alcohol/week: 0.0 standard drinks   Drug use: No   Sexual activity: Not on file  Other Topics Concern   Not on file  Social History Narrative   Patient lives at home with her husband [name] Patient is retired and has a Conservation officer, nature.    Caffeine - some times.   Right handed.   Social Determinants of Health   Financial Resource Strain: Not on file  Food Insecurity: Not on file  Transportation Needs: Not on file  Physical Activity: Not on file  Stress: Not on file  Social Connections: Not on file  Intimate Partner Violence: Not on file   Family History  Problem Relation Age of Onset   Colon cancer Mother    Prostate cancer Father    Colon cancer Father  Diabetes Maternal Aunt    Breast cancer Maternal Aunt    Diabetes Maternal Uncle    Diabetes Paternal Aunt    Stroke Paternal Aunt        > 48   Heart disease Paternal Aunt    Diabetes Paternal Uncle    Breast cancer Maternal  Aunt         X 2   Breast cancer Cousin    BP 132/84   Pulse 73   Ht 5\' 4"  (1.626 m)   SpO2 (!) 81%   BMI 62.65 kg/m   Wt Readings from Last 3 Encounters:  10/02/20 (!) 165.6 kg (365 lb)  10/02/20 (!) 165.8 kg (365 lb 9.6 oz)  09/13/20 (!) 160.4 kg (353 lb 9.6 oz)   PHYSICAL EXAM: General:  Super-morbid obese, arrived in WC, lethargic HEENT: Normal Neck: Supple. JVP to jaw. Carotids 2+ bilat; no bruits. No lymphadenopathy or thryomegaly appreciated. Cor: PMI nondisplaced. Regular rate & rhythm. No rubs, gallops or murmurs. Lungs: Clear Abdomen: morbidly obese, nontender, nondistended. No hepatosplenomegaly. No bruits or masses. Good bowel sounds. Extremities: No cyanosis, clubbing, rash, 2+ BLE edema to knees. Neuro: Alert & oriented x 3, cranial nerves grossly intact. Moves all 4 extremities w/o difficulty. Sleepy.  ASSESSMENT & PLAN: 1. Chronic Hypoxic Respiratory Failure - Multifactorial 2/2. Suspected OSA/ OHS + chronic diastolic heart failure - Sleep study has been scheduled by Pulmonary. - Needs wt loss but mobility limited by severe knee/hip OA.  - Suspect current symptoms respiratory-driven.  2. Chronic Diastolic Heart Failure  - RHC 2008 c/w mild pulmonary hypertension, primarily due to high output state.  Her mean pulmonary artery pressure was 27, wedge pressure was 10. PVR was 1.80 Woods units. - Echo (2017): LVEF 65-70%, G1DD, RV normal - Recent admit for a/c dCHF. Volume improved w/ IV Lasix.  - Echo (4/22): LVEF 60-65%, G2DD  - NYHA III, functional status difficult due to body habitus, asthma/chronic resp failure, and physical deconditioning. Exam difficult for volume, but she looks volume up today. - With lethargy and hypoxia, she needs to go to ED for evaluation. - Continue torsemide 60 mg bid.  - Continue spiro 25 mg daily.  - Continue carvedilol 12.5 mg bid. - No SGLT2i given risk of GU infection given morbid obesity/ large panus .  3. Bilateral LE DVT  (Diagnosed 4/22) - Confirmed by venous dopplers. Chest CT negative for PE. - Now on Eliquis. No bleeding issues.  4. Asthma  - No wheezing on exam.  - On Symbicort. - Followed by PCP.    5. CAD:  - LHC in 2008 showed 50-60% LAD lesion and 30% RCA lesion. - No s/s of angina.  - Continue ? blocker and statin. - No ASA given Eliquis for DVT tx.    6. Stage IIIb CKD - Baseline SCr ~1.5. - Followed by Dr. [name] at [facility]. - BMET today.  She is hypoxic with lethargy and worsening general weakness. She has at least some fluid on board. I am concerned she's hypercarbic +/- pulmonary hypertension. We advise evaluation in the ED, charge called and notified of patient's arrival. Discussed with Dr. Haroldine Laws. Needs ABG off suppl oxygen.  Rafael Bihari, FNP 11/15/20  Patient seen and examined and sent to the ED for further evaluation of hypoxia and probable hypercarbia.   Please see ER consult note for further details.   Glori Bickers, MD  9:40 PM

## 2020-11-15 NOTE — H&P (Signed)
History and Physical    Brienne Liguori ZSW:[id number] DOB: Jun 12, 1950 DOA: 11/15/2020  PCP: [name], MD  Patient coming from: Home  I have personally briefly reviewed patient's old medical records in [place]  Chief Complaint: Lethargy  HPI: April [name] is a 70 y.o. female with medical history significant of CHF (grade 2 DD), DM2, CKD 3, morbid obesity, mild-mod CAD.  Admitted 4/22 for a/c dCHF & acute hypoxic respiratory failure, with likely component of acute asthma exacerbation as well as for management of acute bilateral LE DVT w/ ? Superimposed cellulitis. Treated w/ IV abx and IV Lasix. Echo showed normal LVEF 60-65%. RV normal. She was diuresed and placed back on PO diuretics, switched from lasix 80 bid to torsemide 60 mg d/c. D/c wt 371 lb.  On chronic eliquis for DVT since then, no PE.  She saw pulmonology for OSA 10/02/20, sleep study ordered, low dose Lunesta added.  Presented to [facility] today for f/u.  She was too weak to stand for a weight, oxygen 77% on room air (not on home O20, up to 94% on 2L. She feels like she was going to pass out. She was lethargic. She is very sedentary at baseline w/ limited mobility due to marked obesity and knee/hip OA but previously able to work w/ home PT/OT. Last clinic visit volume stable. Appeared fluid overloaded and hypoxic. Concern for CO2 retention  Sent to ED.   ED Course: CXR neg for acute findings, BNP 129.  ABG confirms acute on chronic hypoxic resp failure.  COVID and flu neg.   Review of Systems: As per HPI, otherwise all review of systems negative.  Past Medical History:  Diagnosis Date   Anemia    Asthma    CAD (coronary artery disease)    Carpal tunnel syndrome    Cellulitis of both lower extremities 04/11/2015   CHF (congestive heart failure) (HCC)    Chronic kidney disease    CKI- followed by [facility]   Colon polyp, hyperplastic 3220 & 2542   Complication of anesthesia 1999   svt with  renal calculi surgery, no problems since   CTS (carpal tunnel syndrome)    bilateral   Diabetes mellitus    Eczema    GERD (gastroesophageal reflux disease)    History of kidney stones 1999   Hyperlipidemia    Hypertension    Leg ulcer ([place]) 04/24/2015   Right lateral leg No evidence of an infection Monitor closely Keep edema controlled    Leg ulcer ([place])    right lower   Meralgia paresthetica    Dr. [name]   Morbid obesity ([place])    Neuropathy    toes and legs   Osteoarthrosis, unspecified whether generalized or localized, lower leg    knee   PUD (peptic ulcer disease)    Shortness of breath dyspnea    with exertion   Sleep apnea    per progress note 02/25/2018   Type II or unspecified type diabetes mellitus without mention of complication, not stated as uncontrolled    Unspecified hereditary and idiopathic peripheral neuropathy    Urticaria    Vitamin B12 deficiency    Wears glasses    Wound cellulitis    right upper leg, healing well    Past Surgical History:  Procedure Laterality Date   ABDOMINAL HYSTERECTOMY     BREAST BIOPSY Right 07/08/2018   CARDIAC CATHETERIZATION  2002   non obstructive disease   colonoscopy with polypectomy  2007 & 2012    hyperplastic ;Dr Watt Climes   COLONOSCOPY WITH PROPOFOL N/A 06/04/2015   Procedure: COLONOSCOPY WITH PROPOFOL;  Surgeon: Jerene Bears, MD;  Location: [facility];  Service: Gastroenterology;  Laterality: N/A;   DEBRIDEMENT LEG Right 03/02/2018   WOUND VAC APPLIED   DEBRIDEMENT LEG Right 05/27/2018   RIGHT LOWER LEG DEBRIDEMENT,  SKIN GRAFT, VAC PLACEMENT    DILATION AND CURETTAGE OF UTERUS     multiple   HEMORRHOID SURGERY     I & D EXTREMITY Right 03/02/2018   Procedure: RIGHT LEG DEBRIDEMENT AND PLACE VAC;  Surgeon: [name], MD;  Location: [place];  Service: Orthopedics;  Laterality: Right;   I & D EXTREMITY Right 03/04/2018   Procedure: REPEAT IRRIGATION AND DEBRIDEMENT RIGHT LEG, PLACE WOUND VAC;  Surgeon: [name], MD;  Location: [place];  Service: Orthopedics;  Laterality: Right;   I & D EXTREMITY Right 03/30/2018   Procedure: IRRIGATION AND DEBRIDEMENT RIGHT LEG, APPLY WOUND VAC;  Surgeon: [name], MD;  Location: [place];  Service: Orthopedics;  Laterality: Right;   I & D EXTREMITY Right 05/27/2018   Procedure: RIGHT LOWER LEG DEBRIDEMENT,  SKIN GRAFT, VAC PLACEMENT;  Surgeon: [name], MD;  Location: [place];  Service: Orthopedics;  Laterality: Right;  RIGHT LOWER LEG DEBRIDEMENT,  SKIN GRAFT, VAC PLACEMENT   renal calculi  12/1997   SVT with induction of anesthesia   right knee arthroscopy     SKIN SPLIT GRAFT Right 03/04/2018   Procedure: POSSIBLE SPLIT THICKNESS SKIN GRAFT;  Surgeon: [name], MD;  Location: [place];  Service: Orthopedics;  Laterality: Right;   SKIN SPLIT GRAFT Right 04/01/2018   Procedure: REPEAT IRRIGATION AND DEBRIDEMENT RIGHT LEG, APPLY SPLIT THICKNESS SKIN GRAFT;  Surgeon: [name], MD;  Location: [place];  Service: Orthopedics;  Laterality: Right;   TONSILLECTOMY AND ADENOIDECTOMY       reports that she has never smoked. She has never used smokeless tobacco. She reports that she does not drink alcohol and does not use drugs.  Allergies  Allergen Reactions   Penicillins Rash and Other (See Comments)    She was told not to take it anymore. Has patient had a PCN reaction causing immediate rash, facial/tongue/throat swelling, SOB or lightheadedness with hypotension: Yes Has patient had a PCN reaction causing severe rash involving mucus membranes or skin necrosis: No Has patient had a PCN reaction that required hospitalization: No Has patient had a PCN reaction occurring within the last 10 years: No If all of the above answers are "NO", then may proceed with Cephalosporin use.'   Shellfish Allergy Anaphylaxis   Sulfonamide Derivatives Anaphylaxis and Other (See Comments)   Hydrochlorothiazide W-Triamterene Other (See Comments)    Hypokalemia   Lotensin  [Benazepril Hcl] Hives   Dipyridamole Other (See Comments)    Unknown reaction   Estrogens Other (See Comments)    Unknown reaction   Hydrochlorothiazide Other (See Comments)    Unknown reaction   Latex Other (See Comments)    Unknown reaction - stated previously during surgery gloves had to be changed, but unsure if it is still an allergy or not.    Metronidazole Other (See Comments)    Unknown reaction   Other Itching    PICKLES   Spironolactone Other (See Comments)    UNSPECIFIED > "kidney problems"   Sulfa Antibiotics Other (See Comments)    Unknown reaction   Torsemide Other (See  Comments)    Unknown reaction   Valsartan Other (See Comments)    Unknown reaction   Mustard [Allyl Isothiocyanate] Itching    Family History  Problem Relation Age of Onset   Colon cancer Mother    Prostate cancer Father    Colon cancer Father    Diabetes Maternal Aunt    Breast cancer Maternal Aunt    Diabetes Maternal Uncle    Diabetes Paternal Aunt    Stroke Paternal Aunt        > 37   Heart disease Paternal Aunt    Diabetes Paternal Uncle    Breast cancer Maternal Aunt         X 2   Breast cancer Cousin      Prior to Admission medications   Medication Sig Start Date End Date Taking? Authorizing Provider  acetaminophen (TYLENOL) 325 MG tablet Take 2 tablets (650 mg total) by mouth every 6 (six) hours as needed for moderate pain (or Fever >/= 101). 11/16/18  Yes Arrien, Jimmy Picket, MD  allopurinol (ZYLOPRIM) 100 MG tablet Take 2 tablets (200 mg total) by mouth daily. 06/27/20  Yes Burns, Claudina Lick, MD  apixaban (ELIQUIS) 5 MG TABS tablet Take 5 mg by mouth 2 (two) times daily.   Yes [provider]  Budesonide (PULMICORT FLEXHALER) 90 MCG/ACT inhaler Inhale 1 puff into the lungs 2 (two) times daily.   Yes [provider]  budesonide-formoterol (SYMBICORT) 160-4.5 MCG/ACT inhaler one - two inhalations every 12 hours; gargle and spit after use Patient taking  differently: Inhale 1-2 puffs into the lungs every 12 (twelve) hours. 02/19/15  Yes Burns, Claudina Lick, MD  calcium carbonate (TUMS - DOSED IN MG ELEMENTAL CALCIUM) 500 MG chewable tablet Chew 2 tablets by mouth daily as needed for indigestion or heartburn.   Yes [provider]  carvedilol (COREG) 12.5 MG tablet TAKE 1 TABLET(12.5 MG) BY MOUTH TWICE DAILY WITH A MEAL Patient taking differently: Take 12.5 mg by mouth 2 (two) times daily with a meal. 09/16/20  Yes Burns, Claudina Lick, MD  cholecalciferol (VITAMIN D3) 25 MCG (1000 UNIT) tablet Take 1 tablet (1,000 Units total) by mouth daily. 09/16/20  Yes Burns, Claudina Lick, MD  colchicine 0.6 MG tablet Take 2 tabs po at onset of gout flare, then one hour later take 1 tab Patient taking differently: Take 1.2 mg by mouth daily as needed (gout flare). 06/28/20  Yes Burns, Claudina Lick, MD  cyanocobalamin (,VITAMIN B-12,) 1000 MCG/ML injection INJECT 1ML INTO MUSCLE EVERY 30 DAYS Patient taking differently: Inject 1,000 mcg into the skin every 30 (thirty) days. 07/31/20  Yes Burns, Claudina Lick, MD  diclofenac sodium (VOLTAREN) 1 % GEL Apply 2 g topically 4 (four) times daily. Patient taking differently: Apply 2 g topically 4 (four) times daily as needed (dry skin, rash). 03/20/16  Yes Marcial Pacas, MD  eszopiclone (LUNESTA) 1 MG TABS tablet Take 1 tablet (1 mg total) by mouth at bedtime as needed for sleep. Take immediately before bedtime Patient taking differently: Take 1 mg by mouth at bedtime as needed for sleep. 10/02/20 11/15/20 Yes Olalere, Adewale A, MD  famotidine (PEPCID) 20 MG tablet Take 1 tablet (20 mg total) by mouth 2 (two) times daily. 11/11/20  Yes Burns, Claudina Lick, MD  FEROSUL 325 (65 Fe) MG tablet TAKE 1 TABLET BY MOUTH EVERY MONDAY, [place], AND FRIDAY Patient taking differently: Take 325 mg by mouth every Monday, Wednesday, and Friday. 10/17/20  Yes Burns, Claudina Lick,  MD  folic acid (FOLVITE) 193 MCG tablet Take 1 tablet (400 mcg total) by mouth at bedtime.  Take 400 mcg by mouth at bedtime. Patient taking differently: Take 400 mcg by mouth at bedtime. 03/12/20  Yes Burns, Claudina Lick, MD  gabapentin (NEURONTIN) 100 MG capsule TAKE 1 CAPSULE(100 MG) BY MOUTH THREE TIMES DAILY Patient taking differently: Take 100 mg by mouth 3 (three) times daily. 09/16/20  Yes Burns, Claudina Lick, MD  hydrOXYzine (ATARAX/VISTARIL) 25 MG tablet Take 1 tablet (25 mg total) by mouth 3 (three) times daily as needed for itching. 03/01/19  Yes Fair, Marin Shutter, MD  montelukast (SINGULAIR) 10 MG tablet Take 1 tablet (10 mg total) by mouth at bedtime. 03/12/20  Yes Burns, Claudina Lick, MD  Nutritional Supplements (,FEEDING SUPPLEMENT, PROSOURCE PLUS) liquid Take 30 mLs by mouth 2 (two) times daily between meals. 05/15/20  Yes Antonieta Pert, MD  Oxcarbazepine (TRILEPTAL) 300 MG tablet Take 1 tablet (300 mg total) by mouth 2 (two) times daily. 06/03/20  Yes Burns, Claudina Lick, MD  oxyCODONE (OXY IR/ROXICODONE) 5 MG immediate release tablet Take 1 tablet (5 mg total) by mouth every 6 (six) hours as needed for severe pain (chronic knee and back pain). 10/28/20  Yes Burns, Claudina Lick, MD  polyethylene glycol (MIRALAX / GLYCOLAX) 17 g packet Take 17 g by mouth daily. Patient taking differently: Take 17 g by mouth daily as needed for mild constipation. 03/02/19  Yes Fair, Marin Shutter, MD  potassium chloride SA (KLOR-CON) 20 MEQ tablet Take 20 mEq by mouth 3 (three) times daily. 11/09/20  Yes [provider]  senna-docusate (SENOKOT-S) 8.6-50 MG tablet Take 1 tablet by mouth at bedtime as needed for mild constipation. 03/01/19  Yes Fair, Marin Shutter, MD  simvastatin (ZOCOR) 40 MG tablet Take 1 tablet (40 mg total) by mouth at bedtime. 05/06/20  Yes Burns, Claudina Lick, MD  spironolactone (ALDACTONE) 25 MG tablet TAKE 1 TABLET(25 MG) BY MOUTH DAILY Patient taking differently: Take 25 mg by mouth daily. 10/28/20  Yes Burns, Claudina Lick, MD  torsemide (DEMADEX) 20 MG tablet Take 3 tablets (60 mg total) by mouth 2 (two) times daily.  May take an additional tablet as directed by CHF clinic. 09/03/20 12/02/20 Yes [name], Maricela Bo, FNP  VENTOLIN HFA 108 (90 Base) MCG/ACT inhaler Inhale 2 puffs into the lungs every 4 (four) hours as needed for wheezing. 11/13/19  Yes [provider]  Alcohol Swabs (B-D SINGLE USE SWABS REGULAR) PADS UAD   E11.9 10/14/20   [name], MD  Blood Glucose Calibration (TRUE METRIX LEVEL 1) Low SOLN UAD  E11.9 10/14/20   [name], MD    Physical Exam: Vitals:   11/15/20 2015 11/15/20 2030 11/15/20 2045 11/15/20 2135  BP: 117/65 (!) 129/106 (!) 115/55   Pulse: 68 66 66 65  Resp: 20 16 20 16   Temp:      TempSrc:      SpO2: 96% 98% 96% 100%  Weight:      Height:        Constitutional: NAD, calm, comfortable, on bipap, super morbid obese Eyes: PERRL, lids and conjunctivae normal ENMT: Mucous membranes are moist. Posterior pharynx clear of any exudate or lesions.Normal dentition.  Neck: normal, supple, no masses, no thyromegaly Respiratory: clear to auscultation bilaterally, no wheezing, no crackles. Normal respiratory effort. No accessory muscle use.  Cardiovascular: Regular rate and rhythm, no murmurs / rubs / gallops. No extremity edema. 2+ pedal pulses. No carotid bruits.  Abdomen: no tenderness, no masses palpated. No hepatosplenomegaly. Bowel sounds positive.  Musculoskeletal: no clubbing / cyanosis. No joint deformity upper and lower extremities. Good ROM, no contractures. Normal muscle tone.  Skin: no rashes, lesions, ulcers. No induration Neurologic: CN 2-12 grossly intact. Sensation intact, DTR normal. Strength 5/5 in all 4.  Psychiatric: Normal judgment and insight. Alert and oriented x 3. Normal mood.    Labs on Admission: I have personally reviewed following labs and imaging studies  CBC: Recent Labs  Lab 11/15/20 1247 11/15/20 1900  WBC 6.0  --   NEUTROABS 4.6  --   HGB 12.3 13.6  HCT 41.3 40.0  MCV 103.0*  --   PLT 166  --    Basic Metabolic  Panel: Recent Labs  Lab 11/15/20 1247 11/15/20 1900  NA 139 141  K 4.6 4.2  CL 98  --   CO2 33*  --   GLUCOSE 130*  --   BUN 21  --   CREATININE 1.65*  --   CALCIUM 9.0  --    GFR: Estimated Creatinine Clearance: 49.1 mL/min (A) (by C-G formula based on SCr of 1.65 mg/dL (H)). Liver Function Tests: No results for input(s): AST, ALT, ALKPHOS, BILITOT, PROT, ALBUMIN in the last 168 hours. No results for input(s): LIPASE, AMYLASE in the last 168 hours. Recent Labs  Lab 11/15/20 1543  AMMONIA <10   Coagulation Profile: No results for input(s): INR, PROTIME in the last 168 hours. Cardiac Enzymes: No results for input(s): CKTOTAL, CKMB, CKMBINDEX, TROPONINI in the last 168 hours. BNP (last 3 results) No results for input(s): PROBNP in the last 8760 hours. HbA1C: No results for input(s): HGBA1C in the last 72 hours. CBG: No results for input(s): GLUCAP in the last 168 hours. Lipid Profile: No results for input(s): CHOL, HDL, LDLCALC, TRIG, CHOLHDL, LDLDIRECT in the last 72 hours. Thyroid Function Tests: Recent Labs    11/15/20 1543  TSH 4.317   Anemia Panel: No results for input(s): VITAMINB12, FOLATE, FERRITIN, TIBC, IRON, RETICCTPCT in the last 72 hours. Urine analysis:    Component Value Date/Time   COLORURINE STRAW (A) 02/15/2019 1856   APPEARANCEUR CLEAR 02/15/2019 1856   LABSPEC 1.006 02/15/2019 1856   PHURINE 8.0 02/15/2019 1856   GLUCOSEU NEGATIVE 02/15/2019 1856   GLUCOSEU NEGATIVE 02/19/2015 1707   HGBUR NEGATIVE 02/15/2019 1856   BILIRUBINUR NEGATIVE 02/15/2019 1856   BILIRUBINUR negative 09/05/2013 0832   KETONESUR NEGATIVE 02/15/2019 1856   PROTEINUR NEGATIVE 02/15/2019 1856   UROBILINOGEN 2.0 (A) 02/19/2015 1707   NITRITE NEGATIVE 02/15/2019 1856   LEUKOCYTESUR NEGATIVE 02/15/2019 1856    Radiological Exams on Admission: DG Chest 2 View  Result Date: 11/15/2020 CLINICAL DATA:  Shortness of breath, chest pain and edema. EXAM: CHEST - 2 VIEW  COMPARISON:  May 07, 2020 FINDINGS: Low lung volumes are seen. There is no evidence of acute infiltrate, pleural effusion or pneumothorax. The cardiac silhouette is mildly enlarged. Mild stable prominence of the bilateral perihilar pulmonary vasculature is noted. Moderate severity calcification of the aortic arch is seen. Multilevel degenerative changes seen throughout the thoracic spine. IMPRESSION: Stable cardiomegaly without acute or active cardiopulmonary disease. Electronically Signed   By: Virgina Norfolk M.D.   On: 11/15/2020 15:46    EKG: Independently reviewed.  Assessment/Plan Principal Problem:   Acute on chronic respiratory failure with hypoxia and hypercapnia (HCC) Active Problems:   Essential hypertension   CAD, NATIVE VESSEL   Acute on chronic diastolic CHF (congestive heart failure) (  [place])   Diabetes mellitus with neurological manifestations ([place])   CKD (chronic kidney disease) stage 3, GFR 30-59 ml/min (HCC)   Morbid obesity (HCC)   Obesity hypoventilation syndrome (HCC)   History of DVT (deep vein thrombosis)    Acute on chronic resp failure with hypoxia and hypercapnea - Patient has acute respiratory failure with hypercapnea. Patient has a pH < 7.35 and pCO2 > 50 On BIPAP Volume overload possibly contributing, but mostly OSA / HVOS as cause probably. Respiratory depression from Oxycodone likely not helping, will try to hold oxycodone (continue other arthritis / chronic pain meds). Hold lunesta for the moment Acute on chronic diastolic CHF - See cards consult note Lasix 80mg  IV BID Daily BMP Cont aldactone Cont coreg H/o Asthma - No wheezing today Cont home nebs H/o BLE DVT - Cont eliquis No PE in April on CTA CKD 3 - Creat near baseline today (1.65 from 1.5 baseline) Monitor daily BMP with diuresis CAD - Stable Cont BB and statin On eliquis  DVT prophylaxis: Eliquis Code Status: Full Family Communication: No family in room Disposition Plan: Home  after respiratory status improved Consults called: Cards has seen pt, see note in chart Admission status: Place in 61     Murial Beam, [place] [facility]  How to contact the [facility] Attending or Consulting provider [place] or covering provider during after hours [place], for this patient?  Check the care team in [facility] and look for a) attending/consulting [facility] provider listed and b) the [facility] team listed Log into [email or web address]  Amion Physician Scheduling and messaging for groups and whole hospitals  On call and physician scheduling software for group practices, residents, hospitalists and other medical providers for call, clinic, rotation and shift schedules. OnCall [employer] is a hospital-wide system for scheduling doctors and paging doctors on call. EasyPlot is for scientific plotting and data analysis.  [email or web address]  and use [place]'s universal password to access. If you do not have the password, please contact the hospital operator.  Locate the [facility] provider you are looking for under [facility] and page to a number that you can be directly reached. If you still have difficulty reaching the provider, please page the [facility] (Director on Call) for the Hospitalists listed on amion for assistance.  11/15/2020, 10:00 PM

## 2020-11-16 ENCOUNTER — Observation Stay (HOSPITAL_COMMUNITY): Payer: Medicare PPO

## 2020-11-16 DIAGNOSIS — D696 Thrombocytopenia, unspecified: Secondary | ICD-10-CM | POA: Diagnosis present

## 2020-11-16 DIAGNOSIS — J9621 Acute and chronic respiratory failure with hypoxia: Secondary | ICD-10-CM | POA: Diagnosis present

## 2020-11-16 DIAGNOSIS — J9620 Acute and chronic respiratory failure, unspecified whether with hypoxia or hypercapnia: Secondary | ICD-10-CM | POA: Diagnosis present

## 2020-11-16 DIAGNOSIS — J9622 Acute and chronic respiratory failure with hypercapnia: Secondary | ICD-10-CM | POA: Diagnosis present

## 2020-11-16 DIAGNOSIS — I2510 Atherosclerotic heart disease of native coronary artery without angina pectoris: Secondary | ICD-10-CM | POA: Diagnosis present

## 2020-11-16 DIAGNOSIS — I517 Cardiomegaly: Secondary | ICD-10-CM | POA: Diagnosis not present

## 2020-11-16 DIAGNOSIS — D649 Anemia, unspecified: Secondary | ICD-10-CM | POA: Diagnosis present

## 2020-11-16 DIAGNOSIS — N1832 Chronic kidney disease, stage 3b: Secondary | ICD-10-CM | POA: Diagnosis present

## 2020-11-16 DIAGNOSIS — N179 Acute kidney failure, unspecified: Secondary | ICD-10-CM | POA: Diagnosis present

## 2020-11-16 DIAGNOSIS — J45909 Unspecified asthma, uncomplicated: Secondary | ICD-10-CM | POA: Diagnosis present

## 2020-11-16 DIAGNOSIS — Z6841 Body Mass Index (BMI) 40.0 and over, adult: Secondary | ICD-10-CM | POA: Diagnosis not present

## 2020-11-16 DIAGNOSIS — E1149 Type 2 diabetes mellitus with other diabetic neurological complication: Secondary | ICD-10-CM | POA: Diagnosis present

## 2020-11-16 DIAGNOSIS — M1710 Unilateral primary osteoarthritis, unspecified knee: Secondary | ICD-10-CM | POA: Diagnosis present

## 2020-11-16 DIAGNOSIS — I5033 Acute on chronic diastolic (congestive) heart failure: Secondary | ICD-10-CM | POA: Diagnosis present

## 2020-11-16 DIAGNOSIS — N390 Urinary tract infection, site not specified: Secondary | ICD-10-CM | POA: Diagnosis not present

## 2020-11-16 DIAGNOSIS — R0602 Shortness of breath: Secondary | ICD-10-CM | POA: Diagnosis not present

## 2020-11-16 DIAGNOSIS — E876 Hypokalemia: Secondary | ICD-10-CM | POA: Diagnosis present

## 2020-11-16 DIAGNOSIS — E662 Morbid (severe) obesity with alveolar hypoventilation: Secondary | ICD-10-CM | POA: Diagnosis present

## 2020-11-16 DIAGNOSIS — I130 Hypertensive heart and chronic kidney disease with heart failure and stage 1 through stage 4 chronic kidney disease, or unspecified chronic kidney disease: Secondary | ICD-10-CM | POA: Diagnosis present

## 2020-11-16 DIAGNOSIS — E785 Hyperlipidemia, unspecified: Secondary | ICD-10-CM | POA: Diagnosis present

## 2020-11-16 DIAGNOSIS — Z86718 Personal history of other venous thrombosis and embolism: Secondary | ICD-10-CM | POA: Diagnosis not present

## 2020-11-16 DIAGNOSIS — J9811 Atelectasis: Secondary | ICD-10-CM | POA: Diagnosis present

## 2020-11-16 DIAGNOSIS — M1610 Unilateral primary osteoarthritis, unspecified hip: Secondary | ICD-10-CM | POA: Diagnosis present

## 2020-11-16 DIAGNOSIS — I272 Pulmonary hypertension, unspecified: Secondary | ICD-10-CM | POA: Diagnosis present

## 2020-11-16 DIAGNOSIS — Z20822 Contact with and (suspected) exposure to covid-19: Secondary | ICD-10-CM | POA: Diagnosis present

## 2020-11-16 DIAGNOSIS — R5381 Other malaise: Secondary | ICD-10-CM | POA: Diagnosis present

## 2020-11-16 DIAGNOSIS — E1122 Type 2 diabetes mellitus with diabetic chronic kidney disease: Secondary | ICD-10-CM | POA: Diagnosis present

## 2020-11-16 LAB — BASIC METABOLIC PANEL
Anion gap: 8 (ref 5–15)
BUN: 17 mg/dL (ref 8–23)
CO2: 35 mmol/L — ABNORMAL HIGH (ref 22–32)
Calcium: 8.6 mg/dL — ABNORMAL LOW (ref 8.9–10.3)
Chloride: 97 mmol/L — ABNORMAL LOW (ref 98–111)
Creatinine, Ser: 1.38 mg/dL — ABNORMAL HIGH (ref 0.44–1.00)
GFR, Estimated: 41 mL/min — ABNORMAL LOW (ref >60)
Glucose, Bld: 105 mg/dL — ABNORMAL HIGH (ref 70–99)
Potassium: 4.3 mmol/L (ref 3.5–5.1)
Sodium: 140 mmol/L (ref 135–145)

## 2020-11-16 LAB — GLUCOSE, CAPILLARY
Glucose-Capillary: 122 mg/dL — ABNORMAL HIGH (ref 70–99)
Glucose-Capillary: 109 mg/dL — ABNORMAL HIGH (ref 70–99)
Glucose-Capillary: 100 mg/dL — ABNORMAL HIGH (ref 70–99)

## 2020-11-16 LAB — CBG MONITORING, ED: Glucose-Capillary: 137 mg/dL — ABNORMAL HIGH (ref 70–99)

## 2020-11-16 LAB — HIV ANTIBODY (ROUTINE TESTING W REFLEX): HIV Screen 4th Generation wRfx: NONREACTIVE

## 2020-11-16 NOTE — Progress Notes (Signed)
Pt placed on heated high flow and resting comfortably. Rt will cont to monitor.

## 2020-11-16 NOTE — Progress Notes (Signed)
Pt placed back on BiPAP. Pt is unable to tolerate HHFNC and needs to stay on BiPAP to blow off as much CO2 as possible per MD. RT will cont to monitor.

## 2020-11-16 NOTE — ED Notes (Signed)
This RN notified MD gardner that pt is lethargic - pt able to take meds and wake up - This RN also notified MD Alcario Drought about worry for aspiration since pt is coughing and unable to to really take off BiPap by herself.  MD Alcario Drought worried that if pt comes off bipap completley she will have to be intubated. MD Alcario Drought ok to try cpap nasal mask and RT notified

## 2020-11-16 NOTE — Progress Notes (Signed)
Progress Note  Patient Name: April [name] Date of Encounter: 11/16/2020  Primary Cardiologist: None   Subjective   Admitted yesterday from advanced HF clinic for hypoxia and weakness. Felt to have chronic hypoxemic hypercarbic respiratory failure by ABG, contributed to by OSA/OHS and chronic diastolic HF.   Inpatient Medications    Scheduled Meds:  (feeding supplement) PROSource Plus  30 mL Oral BID BM   allopurinol  200 mg Oral Daily   apixaban  5 mg Oral BID   carvedilol  12.5 mg Oral BID WC   cholecalciferol  1,000 Units Oral Daily   famotidine  20 mg Oral BID   [START ON 11/18/2020] ferrous sulfate  325 mg Oral Q M,W,F   folic acid  2,505 mcg Oral QHS   furosemide  80 mg Intravenous BID   gabapentin  100 mg Oral TID   mometasone-formoterol  2 puff Inhalation BID   montelukast  10 mg Oral QHS   Oxcarbazepine  300 mg Oral BID   potassium chloride SA  20 mEq Oral TID   simvastatin  40 mg Oral QHS   spironolactone  25 mg Oral Daily   Continuous Infusions:  PRN Meds: acetaminophen, albuterol, diclofenac Sodium, hydrOXYzine, ondansetron **OR** ondansetron (ZOFRAN) IV, polyethylene glycol, senna-docusate   Vital Signs    Vitals:   11/16/20 0800 11/16/20 0835 11/16/20 0927 11/16/20 1122  BP: (!) 148/85   110/70  Pulse: 93 90  85  Resp: 17 19  19   Temp:    98.2 F (36.8 C)  TempSrc:    Oral  SpO2: 99%  94% 93%  Weight:      Height:        Intake/Output Summary (Last 24 hours) at 11/16/2020 1130 Last data filed at 11/16/2020 0640 Gross per 24 hour  Intake --  Output 2000 ml  Net -2000 ml   Filed Weights   11/15/20 1247  Weight: (!) 163.3 kg    Telemetry    SR - Personally Reviewed  ECG    Repeat pending, yesterday SR with prolonged QTc 508 - Personally Reviewed  Physical Exam   GEN: No acute distress.   Neck: JVD difficult to appreciate Cardiac: regular rhythm, normal rate, no murmurs, rubs, or gallops.  Respiratory: Clear to auscultation  bilaterally. GI: Soft, nontender, non-distended  MS: 1+ edema; No deformity. Neuro:  Nonfocal  Psych: Normal affect   Labs    Chemistry Recent Labs  Lab 11/15/20 1247 11/15/20 1900 11/16/20 0344  NA 139 141 140  K 4.6 4.2 4.3  CL 98  --  97*  CO2 33*  --  35*  GLUCOSE 130*  --  105*  BUN 21  --  17  CREATININE 1.65*  --  1.38*  CALCIUM 9.0  --  8.6*  GFRNONAA 33*  --  41*  ANIONGAP 8  --  8     Hematology Recent Labs  Lab 11/15/20 1247 11/15/20 1900  WBC 6.0  --   RBC 4.01  --   HGB 12.3 13.6  HCT 41.3 40.0  MCV 103.0*  --   MCH 30.7  --   MCHC 29.8*  --   RDW 17.6*  --   PLT 166  --     Cardiac EnzymesNo results for input(s): TROPONINI in the last 168 hours. No results for input(s): TROPIPOC in the last 168 hours.   BNP Recent Labs  Lab 11/15/20 1247  BNP 129.4*     DDimer No results  for input(s): DDIMER in the last 168 hours.   Radiology    DG Chest 2 View  Result Date: 11/15/2020 CLINICAL DATA:  Shortness of breath, chest pain and edema. EXAM: CHEST - 2 VIEW COMPARISON:  May 07, 2020 FINDINGS: Low lung volumes are seen. There is no evidence of acute infiltrate, pleural effusion or pneumothorax. The cardiac silhouette is mildly enlarged. Mild stable prominence of the bilateral perihilar pulmonary vasculature is noted. Moderate severity calcification of the aortic arch is seen. Multilevel degenerative changes seen throughout the thoracic spine. IMPRESSION: Stable cardiomegaly without acute or active cardiopulmonary disease. Electronically Signed   By: Virgina Norfolk M.D.   On: 11/15/2020 15:46   DG Chest Portable 1 View  Result Date: 11/16/2020 CLINICAL DATA:  Shortness of breath.  CO2 retention EXAM: PORTABLE CHEST 1 VIEW COMPARISON:  11/15/2020 FINDINGS: Cardiomegaly and vascular pedicle widening with hilar vessel congestion. Low lung volumes. No edema, effusion, or pneumothorax. IMPRESSION: Stable from prior.  Low volume chest with vascular  congestion. Electronically Signed   By: Jorje Guild M.D.   On: 11/16/2020 07:19    Cardiac Studies   Echo pending for RV function  Patient Profile     70 y.o. female with morbid obesity, h/o diastolic HF, OSA/OHS, CKD 3a, nonobstructive CAD. Presented to HF clinic 11/15/20 for routine visits. Was found to be markedly hypoxic (not on home O2) and lethargic concerning for hypercarbia. Was placed on O2 and sent to ER. Hypoxemic, hypercarbic respiratory failure with mixed etiology.  Assessment & Plan   Principal Problem:   Acute on chronic respiratory failure with hypoxia and hypercapnia (HCC) Active Problems:   Essential hypertension   CAD, NATIVE VESSEL   Acute on chronic diastolic CHF (congestive heart failure) (HCC)   Diabetes mellitus with neurological manifestations (HCC)   CKD (chronic kidney disease) stage 3, GFR 30-59 ml/min (HCC)   Morbid obesity (HCC)   Obesity hypoventilation syndrome (HCC)   History of DVT (deep vein thrombosis)   Feels marginally better today.  - continue lasix 80 mg IV bid today - ekg to recheck Qtc - echo pending for RV function.  - continue spironolactone, coreg - labs grossly stable.     - strongly encouraged outpatient sleep study, critical for continued management.   For questions or updates, please contact [place] Please consult [email or web address] for contact info under        Signed, Elouise Munroe, MD  11/16/2020, 11:30 AM

## 2020-11-16 NOTE — ED Notes (Signed)
Pt very lethargic with confusion - calling this RN her mom, grunting and not responding to painful stimuli - MD Alcario Drought paged and told this RN to place pt back on bipap - RT notified - MD Alcario Drought asked to come lay eyes on pt

## 2020-11-16 NOTE — ED Notes (Signed)
MD Alcario Drought at bedside to speak to pt about code status/intubation - Pt states she will need to speak to her family first - as of right now pt remains a full code - MD verbal order for [facility] [place] - RT notified

## 2020-11-16 NOTE — Progress Notes (Signed)
Patient was transported to 2C01 on a NRB & placed on [place] with 50% FIO2 & 30L once she arrived to [facility]. Report was given to [facility] RT.

## 2020-11-16 NOTE — ED Notes (Signed)
Breakfast Order Placed ?

## 2020-11-16 NOTE — ED Notes (Signed)
Patient bed assigned to 2C01, attempted to call for report, unsuccessful.

## 2020-11-16 NOTE — ED Notes (Addendum)
Pt placed back on bipap - pt waking up more and able to answer questions - MD Alcario Drought at bedside - Xray ordered

## 2020-11-16 NOTE — ED Notes (Signed)
Pt switched to cpap by RRT - Pt not tolerating and keeps taking off and asking for nasal canula back - pt educated on risks but still not tolerating cpap - MD Alcario Drought notified and okay to switch pt to [facility] but keep eye on for lethargy/oversedation  ]

## 2020-11-16 NOTE — Progress Notes (Signed)
Pt placed on Bilevel through dreamstation CPAP with a M/L nasal mask. 12/6 with 5L O2 bleed in, currently sat 98. Rt will cont to monitor.

## 2020-11-16 NOTE — Progress Notes (Signed)
PROGRESS NOTE  April [name] JME:[id number] DOB: Feb 10, 1950   PCP: [name], MD  Patient is from: Home.  Lives with husband.  Uses walker at baseline.  DOA: 11/15/2020 LOS: 0  Chief complaints:  Chief Complaint  Patient presents with   Shortness of Breath     Brief Narrative / Interim history: 70 year old F with PMH of diastolic CHF, DM-2, CKD 3, morbid obesity, asthma, OSA not on CPAP, DVT on Eliquis, CAD and ambulatory dysfunction presenting from advanced heart failure clinic with hypoxia to 77%, generalized weakness and near syncope, and admitted for acute on chronic hypoxic and hypercapnic respiratory failure and CHF exacerbation requiring BiPAP.  Cardiology following.  Subjective: Seen and examined earlier this morning.  Patient became lethargic requiring BiPAP last night.  Patient was off BiPAP when I saw her this morning.  She is awake alert and oriented x4.  Reports improvement in her breathing.  She denies chest pain, GI or UTI symptoms.  Objective: Vitals:   11/16/20 0800 11/16/20 0835 11/16/20 0927 11/16/20 1122  BP: (!) 148/85   110/70  Pulse: 93 90  85  Resp: 17 19  19   Temp:    98.2 F (36.8 C)  TempSrc:    Oral  SpO2: 99%  94% 93%  Weight:      Height:        Intake/Output Summary (Last 24 hours) at 11/16/2020 1240 Last data filed at 11/16/2020 0640 Gross per 24 hour  Intake --  Output 2000 ml  Net -2000 ml   Filed Weights   11/15/20 1247  Weight: (!) 163.3 kg    Examination:  GENERAL: No apparent distress.  Nontoxic. HEENT: MMM.  Vision and hearing grossly intact.  NECK: Supple.  Difficult to assess JVD due to body habitus. RESP:  No IWOB.  Fair aeration bilaterally but limited exam due to body habitus. CVS:  RRR. Heart sounds normal.  ABD/GI/GU: BS+. Abd soft, NTND but limited exam due to body habitus.  MSK/EXT:  Moves extremities.  BLE weakness.  Some signs of chronic venous insufficiency SKIN: Old scar over RLE.  No signs of  infection no open wound. NEURO: Awake, alert and oriented appropriately.  No apparent focal neuro deficit other than generalized weakness. PSYCH: Calm. Normal affect.   Procedures:  None  Microbiology summarized: [id number] and influenza PCR nonreactive.  Assessment & Plan: Acute on chronic respiratory failure with hypoxia and hypercapnia-ABG 7.29/80/69/41 suggesting some acute on chronic respiratory acidosis.  Likely driven by obstructive process/OSA/possible OHS.  Required BiPAP but weaned to 25 L with 40% FiO2.  Doubt PE.  She is on Eliquis for DVT although efficacy not well studied in patient population like her. -Minimal oxygen to keep saturation above 88%.  Discussed with patient and patient's RN -Manage CHF as below -Needs BiPAP when asleep full code -Outpatient follow-up with pulmonology for sleep study as previously planned -Avoid or minimize sedating medications -May consider changing Coreg to Bystolic with beta beta-1 selectivity. -Low threshold to consult cardiology if no improvement.  Acute on chronic diastolic CHF: TTE in 03/1192 with LVEF of 60 to 65%, G2 DD, and RVSP of 66 mmHg.  Difficult to assess fluid status due to morbid obesity with BMI of 62.  Seems to be improving with diuretics.  About 2 L UOP overnight.  Renal function improved as well. -BiPAP as above -Defer diuretics to cardiology -Monitor fluid status, renal functions and electrolytes -Sodium and fluid restriction  History of asthma-doubt this is playing  a role -Continue home inhalers with as needed DuoNebs  AKI on CKD-3A: Improving. Recent Labs    05/12/20 0541 05/13/20 0334 05/15/20 0428 06/11/20 0955 08/14/20 1150 08/30/20 1130 09/10/20 1553 10/02/20 1039 11/15/20 1247 11/16/20 0344  BUN 25* 27* 38* 21 19 19 20 22 21 17   CREATININE 1.55* 1.56* 1.50* 1.46* 1.54* 1.41* 1.26* 1.40* 1.65* 1.38*  -Continue monitoring  CAD: Patient denies chest pain.  EKG and troponin reassuring. -Continue home  cardiac meds  History of BLE DVT-low suspicion for PE -Continue home Eliquis  Morbid obesity-this complicates patient's ability to ambulate Body mass index is 61.79 kg/m. -Encourage lifestyle change to lose weight -Bariatric surgery?         DVT prophylaxis:   apixaban (ELIQUIS) tablet 5 mg  Code Status: Full code Family Communication: Patient and/or RN. Available if any question.  Level of care: Progressive Status is: Observation  The patient will require care spanning > 2 midnights and should be moved to inpatient because: Significantly increased oxygen requirement, intermittent BiPAP need and IV diuretics     Consultants:  Cardiology   Sch Meds:  Scheduled Meds:  (feeding supplement) PROSource Plus  30 mL Oral BID BM   allopurinol  200 mg Oral Daily   apixaban  5 mg Oral BID   carvedilol  12.5 mg Oral BID WC   cholecalciferol  1,000 Units Oral Daily   famotidine  20 mg Oral BID   [START ON 11/18/2020] ferrous sulfate  325 mg Oral Q M,W,F   folic acid  3,976 mcg Oral QHS   furosemide  80 mg Intravenous BID   gabapentin  100 mg Oral TID   mometasone-formoterol  2 puff Inhalation BID   montelukast  10 mg Oral QHS   Oxcarbazepine  300 mg Oral BID   potassium chloride SA  20 mEq Oral TID   simvastatin  40 mg Oral QHS   spironolactone  25 mg Oral Daily   Continuous Infusions: PRN Meds:.acetaminophen, albuterol, diclofenac Sodium, hydrOXYzine, ondansetron **OR** ondansetron (ZOFRAN) IV, polyethylene glycol, senna-docusate  Antimicrobials: Anti-infectives (From admission, onward)    None        I have personally reviewed the following labs and images: CBC: Recent Labs  Lab 11/15/20 1247 11/15/20 1900  WBC 6.0  --   NEUTROABS 4.6  --   HGB 12.3 13.6  HCT 41.3 40.0  MCV 103.0*  --   PLT 166  --    BMP &GFR Recent Labs  Lab 11/15/20 1247 11/15/20 1900 11/16/20 0344  NA 139 141 140  K 4.6 4.2 4.3  CL 98  --  97*  CO2 33*  --  35*  GLUCOSE  130*  --  105*  BUN 21  --  17  CREATININE 1.65*  --  1.38*  CALCIUM 9.0  --  8.6*   Estimated Creatinine Clearance: 58.7 mL/min (A) (by C-G formula based on SCr of 1.38 mg/dL (H)). Liver & Pancreas: No results for input(s): AST, ALT, ALKPHOS, BILITOT, PROT, ALBUMIN in the last 168 hours. No results for input(s): LIPASE, AMYLASE in the last 168 hours. Recent Labs  Lab 11/15/20 1543  AMMONIA <10   Diabetic: No results for input(s): HGBA1C in the last 72 hours. Recent Labs  Lab 11/16/20 0838 11/16/20 1125  GLUCAP 137* 122*   Cardiac Enzymes: No results for input(s): CKTOTAL, CKMB, CKMBINDEX, TROPONINI in the last 168 hours. No results for input(s): PROBNP in the last 8760 hours. Coagulation Profile: No results for  input(s): INR, PROTIME in the last 168 hours. Thyroid Function Tests: Recent Labs    11/15/20 1543  TSH 4.317   Lipid Profile: No results for input(s): CHOL, HDL, LDLCALC, TRIG, CHOLHDL, LDLDIRECT in the last 72 hours. Anemia Panel: No results for input(s): VITAMINB12, FOLATE, FERRITIN, TIBC, IRON, RETICCTPCT in the last 72 hours. Urine analysis:    Component Value Date/Time   COLORURINE STRAW (A) 02/15/2019 1856   APPEARANCEUR CLEAR 02/15/2019 1856   LABSPEC 1.006 02/15/2019 1856   PHURINE 8.0 02/15/2019 1856   GLUCOSEU NEGATIVE 02/15/2019 1856   GLUCOSEU NEGATIVE 02/19/2015 1707   HGBUR NEGATIVE 02/15/2019 1856   BILIRUBINUR NEGATIVE 02/15/2019 1856   BILIRUBINUR negative 09/05/2013 0832   KETONESUR NEGATIVE 02/15/2019 1856   PROTEINUR NEGATIVE 02/15/2019 1856   UROBILINOGEN 2.0 (A) 02/19/2015 1707   NITRITE NEGATIVE 02/15/2019 1856   LEUKOCYTESUR NEGATIVE 02/15/2019 1856   Sepsis Labs: Invalid input(s): PROCALCITONIN, [place]  Microbiology: Recent Results (from the past 240 hour(s))  Resp Panel by RT-PCR (Flu A&B, Covid) Nasopharyngeal Swab     Status: None   Collection Time: 11/15/20 12:50 PM   Specimen: Nasopharyngeal Swab;  Nasopharyngeal(NP) swabs in vial transport medium  Result Value Ref Range Status   SARS Coronavirus 2 by RT PCR NEGATIVE NEGATIVE Final    Comment: (NOTE) SARS-CoV-2 target nucleic acids are NOT DETECTED.  The SARS-CoV-2 RNA is generally detectable in upper respiratory specimens during the acute phase of infection. The lowest concentration of SARS-CoV-2 viral copies this assay can detect is 138 copies/mL. A negative result does not preclude SARS-Cov-2 infection and should not be used as the sole basis for treatment or other patient management decisions. A negative result may occur with  improper specimen collection/handling, submission of specimen other than nasopharyngeal swab, presence of viral mutation(s) within the areas targeted by this assay, and inadequate number of viral copies(<138 copies/mL). A negative result must be combined with clinical observations, patient history, and epidemiological information. The expected result is Negative.  Fact Sheet for Patients:  EntrepreneurPulse.com.au  Fact Sheet for Healthcare Providers:  IncredibleEmployment.be  This test is no t yet approved or cleared by the [place] FDA and  has been authorized for detection and/or diagnosis of SARS-CoV-2 by FDA under an Emergency Use Authorization (EUA). This EUA will remain  in effect (meaning this test can be used) for the duration of the COVID-19 declaration under Section 564(b)(1) of the Act, 21 U.S.C.section 360bbb-3(b)(1), unless the authorization is terminated  or revoked sooner.       Influenza A by PCR NEGATIVE NEGATIVE Final   Influenza B by PCR NEGATIVE NEGATIVE Final    Comment: (NOTE) The Xpert Xpress SARS-CoV-2/FLU/RSV plus assay is intended as an aid in the diagnosis of influenza from Nasopharyngeal swab specimens and should not be used as a sole basis for treatment. Nasal washings and aspirates are unacceptable for Xpert Xpress  SARS-CoV-2/FLU/RSV testing.  Fact Sheet for Patients: EntrepreneurPulse.com.au  Fact Sheet for Healthcare Providers: IncredibleEmployment.be  This test is not yet approved or cleared by the [place] FDA and has been authorized for detection and/or diagnosis of SARS-CoV-2 by FDA under an Emergency Use Authorization (EUA). This EUA will remain in effect (meaning this test can be used) for the duration of the COVID-19 declaration under Section 564(b)(1) of the Act, 21 U.S.C. section 360bbb-3(b)(1), unless the authorization is terminated or revoked.  Performed at [facility], [place] [address], [place], [place] [address]     Radiology Studies: DG  Chest 2 View  Result Date: 11/15/2020 CLINICAL DATA:  Shortness of breath, chest pain and edema. EXAM: CHEST - 2 VIEW COMPARISON:  May 07, 2020 FINDINGS: Low lung volumes are seen. There is no evidence of acute infiltrate, pleural effusion or pneumothorax. The cardiac silhouette is mildly enlarged. Mild stable prominence of the bilateral perihilar pulmonary vasculature is noted. Moderate severity calcification of the aortic arch is seen. Multilevel degenerative changes seen throughout the thoracic spine. IMPRESSION: Stable cardiomegaly without acute or active cardiopulmonary disease. Electronically Signed   By: Virgina Norfolk M.D.   On: 11/15/2020 15:46   DG Chest Portable 1 View  Result Date: 11/16/2020 CLINICAL DATA:  Shortness of breath.  CO2 retention EXAM: PORTABLE CHEST 1 VIEW COMPARISON:  11/15/2020 FINDINGS: Cardiomegaly and vascular pedicle widening with hilar vessel congestion. Low lung volumes. No edema, effusion, or pneumothorax. IMPRESSION: Stable from prior.  Low volume chest with vascular congestion. Electronically Signed   By: Jorje Guild M.D.   On: 11/16/2020 07:19       Jazlyne Gauger T. Morgan  If 7PM-7AM, please contact night-coverage [email or web address] 11/16/2020,  12:40 PM

## 2020-11-17 ENCOUNTER — Inpatient Hospital Stay (HOSPITAL_COMMUNITY): Payer: Medicare PPO

## 2020-11-17 DIAGNOSIS — J9622 Acute and chronic respiratory failure with hypercapnia: Secondary | ICD-10-CM | POA: Diagnosis not present

## 2020-11-17 DIAGNOSIS — J9621 Acute and chronic respiratory failure with hypoxia: Secondary | ICD-10-CM | POA: Diagnosis not present

## 2020-11-17 LAB — RENAL FUNCTION PANEL
Albumin: 2.8 g/dL — ABNORMAL LOW (ref 3.5–5.0)
Anion gap: 9 (ref 5–15)
BUN: 21 mg/dL (ref 8–23)
CO2: 31 mmol/L (ref 22–32)
Calcium: 8.2 mg/dL — ABNORMAL LOW (ref 8.9–10.3)
Chloride: 96 mmol/L — ABNORMAL LOW (ref 98–111)
Creatinine, Ser: 1.4 mg/dL — ABNORMAL HIGH (ref 0.44–1.00)
GFR, Estimated: 40 mL/min — ABNORMAL LOW (ref >60)
Glucose, Bld: 119 mg/dL — ABNORMAL HIGH (ref 70–99)
Phosphorus: 4.5 mg/dL (ref 2.5–4.6)
Potassium: 4.7 mmol/L (ref 3.5–5.1)
Sodium: 136 mmol/L (ref 135–145)

## 2020-11-17 LAB — GLUCOSE, CAPILLARY
Glucose-Capillary: 125 mg/dL — ABNORMAL HIGH (ref 70–99)
Glucose-Capillary: 176 mg/dL — ABNORMAL HIGH (ref 70–99)
Glucose-Capillary: 88 mg/dL (ref 70–99)
Glucose-Capillary: 102 mg/dL — ABNORMAL HIGH (ref 70–99)

## 2020-11-17 LAB — CBC
HCT: 37.5 % (ref 36.0–46.0)
Hemoglobin: 10.9 g/dL — ABNORMAL LOW (ref 12.0–15.0)
MCH: 30.5 pg (ref 26.0–34.0)
MCHC: 29.1 g/dL — ABNORMAL LOW (ref 30.0–36.0)
MCV: 105 fL — ABNORMAL HIGH (ref 80.0–100.0)
Platelets: 143 10*3/uL — ABNORMAL LOW (ref 150–400)
RBC: 3.57 MIL/uL — ABNORMAL LOW (ref 3.87–5.11)
RDW: 17.1 % — ABNORMAL HIGH (ref 11.5–15.5)
WBC: 5.9 10*3/uL (ref 4.0–10.5)
nRBC: 0.7 % — ABNORMAL HIGH (ref 0.0–0.2)

## 2020-11-17 LAB — MAGNESIUM: Magnesium: 2.2 mg/dL (ref 1.7–2.4)

## 2020-11-17 MED ORDER — GERHARDT'S BUTT CREAM
TOPICAL_CREAM | Freq: Three times a day (TID) | CUTANEOUS | Status: DC
  Filled 2020-11-17 (×2): qty 1

## 2020-11-17 MED ORDER — POTASSIUM CHLORIDE CRYS ER 20 MEQ PO TBCR
20.0000 meq | EXTENDED_RELEASE_TABLET | Freq: Two times a day (BID) | ORAL | Status: DC
  Administered 2020-11-17 – 2020-11-19 (×5): 20 meq via ORAL
  Filled 2020-11-17 (×5): qty 1

## 2020-11-17 NOTE — Plan of Care (Signed)

## 2020-11-17 NOTE — Progress Notes (Signed)
Progress Note  Patient Name: April [name] Date of Encounter: 11/17/2020  Primary Cardiologist: None   Subjective   Admitted from advanced HF clinic for hypoxia and weakness. Felt to have chronic hypoxemic hypercarbic respiratory failure by ABG, contributed to by OSA/OHS and chronic diastolic HF.   Feels she is going "up and down" with symptoms, not noting much improvement. Unclear if she has been mobilizing.   Inpatient Medications    Scheduled Meds:  (feeding supplement) PROSource Plus  30 mL Oral BID BM   allopurinol  200 mg Oral Daily   apixaban  5 mg Oral BID   carvedilol  12.5 mg Oral BID WC   cholecalciferol  1,000 Units Oral Daily   famotidine  20 mg Oral BID   [START ON 11/18/2020] ferrous sulfate  325 mg Oral Q M,W,F   folic acid  2,025 mcg Oral QHS   furosemide  80 mg Intravenous BID   gabapentin  100 mg Oral TID   mometasone-formoterol  2 puff Inhalation BID   montelukast  10 mg Oral QHS   Oxcarbazepine  300 mg Oral BID   potassium chloride SA  20 mEq Oral TID   simvastatin  40 mg Oral QHS   spironolactone  25 mg Oral Daily   Continuous Infusions:  PRN Meds: acetaminophen, albuterol, diclofenac Sodium, hydrOXYzine, ondansetron **OR** ondansetron (ZOFRAN) IV, polyethylene glycol, senna-docusate   Vital Signs    Vitals:   11/17/20 0532 11/17/20 0730 11/17/20 0750 11/17/20 1126  BP:   (!) 155/73 (!) 106/56  Pulse:  81 62 76  Resp:  16 17 19   Temp:   99.3 F (37.4 C) 98.4 F (36.9 C)  TempSrc:   Oral Oral  SpO2:  100% (!) 87% 90%  Weight: (!) 166.8 kg     Height:        Intake/Output Summary (Last 24 hours) at 11/17/2020 1223 Last data filed at 11/17/2020 0939 Gross per 24 hour  Intake --  Output 2050 ml  Net -2050 ml   Filed Weights   11/15/20 1247 11/17/20 0532  Weight: (!) 163.3 kg (!) 166.8 kg    Telemetry    SR - Personally Reviewed  ECG    NSR, Qtc is 481- Personally Reviewed  Physical Exam   GEN: No acute distress.    Neck: JVD difficult to appreciate Cardiac: regular rhythm, normal rate, no murmurs, rubs, or gallops.  Respiratory: Clear to auscultation bilaterally. GI: Soft, nontender, non-distended  MS: 1+ edema; No deformity. Neuro:  Nonfocal  Psych: Normal affect   Labs    Chemistry Recent Labs  Lab 11/15/20 1247 11/15/20 1900 11/16/20 0344 11/17/20 0121  NA 139 141 140 136  K 4.6 4.2 4.3 4.7  CL 98  --  97* 96*  CO2 33*  --  35* 31  GLUCOSE 130*  --  105* 119*  BUN 21  --  17 21  CREATININE 1.65*  --  1.38* 1.40*  CALCIUM 9.0  --  8.6* 8.2*  ALBUMIN  --   --   --  2.8*  GFRNONAA 33*  --  41* 40*  ANIONGAP 8  --  8 9     Hematology Recent Labs  Lab 11/15/20 1247 11/15/20 1900 11/17/20 0121  WBC 6.0  --  5.9  RBC 4.01  --  3.57*  HGB 12.3 13.6 10.9*  HCT 41.3 40.0 37.5  MCV 103.0*  --  105.0*  MCH 30.7  --  30.5  MCHC  29.8*  --  29.1*  RDW 17.6*  --  17.1*  PLT 166  --  143*    Cardiac EnzymesNo results for input(s): TROPONINI in the last 168 hours. No results for input(s): TROPIPOC in the last 168 hours.   BNP Recent Labs  Lab 11/15/20 1247  BNP 129.4*     DDimer No results for input(s): DDIMER in the last 168 hours.   Radiology    DG Chest 2 View  Result Date: 11/15/2020 CLINICAL DATA:  Shortness of breath, chest pain and edema. EXAM: CHEST - 2 VIEW COMPARISON:  May 07, 2020 FINDINGS: Low lung volumes are seen. There is no evidence of acute infiltrate, pleural effusion or pneumothorax. The cardiac silhouette is mildly enlarged. Mild stable prominence of the bilateral perihilar pulmonary vasculature is noted. Moderate severity calcification of the aortic arch is seen. Multilevel degenerative changes seen throughout the thoracic spine. IMPRESSION: Stable cardiomegaly without acute or active cardiopulmonary disease. Electronically Signed   By: Virgina Norfolk M.D.   On: 11/15/2020 15:46   DG Chest Portable 1 View  Result Date: 11/16/2020 CLINICAL DATA:   Shortness of breath.  CO2 retention EXAM: PORTABLE CHEST 1 VIEW COMPARISON:  11/15/2020 FINDINGS: Cardiomegaly and vascular pedicle widening with hilar vessel congestion. Low lung volumes. No edema, effusion, or pneumothorax. IMPRESSION: Stable from prior.  Low volume chest with vascular congestion. Electronically Signed   By: Jorje Guild M.D.   On: 11/16/2020 07:19    Cardiac Studies   Echo pending for RV function  Patient Profile     70 y.o. female with morbid obesity, h/o diastolic HF, OSA/OHS, CKD 3a, nonobstructive CAD. Presented to HF clinic 11/15/20 for routine visits. Was found to be markedly hypoxic (not on home O2) and lethargic concerning for hypercarbia. Was placed on O2 and sent to ER. Hypoxemic, hypercarbic respiratory failure with mixed etiology.  Assessment & Plan   Principal Problem:   Acute on chronic respiratory failure with hypoxia and hypercapnia (HCC) Active Problems:   Essential hypertension   CAD, NATIVE VESSEL   Acute on chronic diastolic CHF (congestive heart failure) (HCC)   Diabetes mellitus with neurological manifestations (HCC)   CKD (chronic kidney disease) stage 3, GFR 30-59 ml/min (HCC)   Morbid obesity (HCC)   Obesity hypoventilation syndrome (HCC)   History of DVT (deep vein thrombosis)   Acute on chronic respiratory failure, unspecified whether with hypoxia or hypercapnia (HCC)   - continue lasix 80 mg IV bid today, good output though unclear if being strictly charted. Weights inaccurate. -4 L for admission, no ins charted. O2 requirement still significant but improved, on HFNC.  - ekg stable. - echo pending for RV function.  - continue spironolactone, coreg - labs grossly stable.     - strongly encouraged outpatient sleep study, critical for continued management.   For questions or updates, please contact [place] Please consult [email or web address] for contact info under        Signed, Elouise Munroe, MD  11/17/2020, 12:23 PM

## 2020-11-17 NOTE — Progress Notes (Signed)
Echo attempted. Patient just moved to chair per RN. Will attempt again later as time and schedule permit.

## 2020-11-17 NOTE — Evaluation (Signed)
Physical Therapy Evaluation Patient Details Name: April [name] MRN: [id number] DOB: [birth year] Today's Date: 11/17/2020  History of Present Illness  70 y.o. female presents to [facility] hospital on 11/15/2020 from advanced heart failure clinic with hypoxia, weakness, and near syncope. Pt admitted for acute on chronic hypoxic and hypercapnic respiratory failure and CHF exacerbation requiring BiPAP. PMH includes diastolic CHF, DM-2, CKD 3, morbid obesity, asthma, OSA not on CPAP, DVT on Eliquis, CAD and ambulatory dysfunction.  Clinical Impression  Pt presents to PT with deficits in functional mobility, power, strength, balance, endurance, and gait. Pt currently requires physical assistance to transfer and activity tolerance is significantly limited due to SOB and desaturation even when on 20L heated high flow. Pt will benefit from continued aggressive mobilization to aide in improving activity tolerance while reducing falls risk. PT provides instruction on LE HEP to improve strength. Pt adamantly against short term inpatient rehab, preferring to discharge home. PT recommends discharge home with HHPT and assistance from family for all functional mobility.       Recommendations for follow up therapy are one component of a multi-disciplinary discharge planning process, led by the attending physician.  Recommendations may be updated based on patient status, additional functional criteria and insurance authorization.  Follow Up Recommendations Home health PT;Supervision for mobility/OOB (pt adamantly against SNF placement)    Equipment Recommendations  None recommended by PT (pt owns necessary DME)    Recommendations for Other Services       Precautions / Restrictions Precautions Precautions: Fall Precaution Comments: heated high flow Restrictions Weight Bearing Restrictions: No      Mobility  Bed Mobility Overal bed mobility:  (not assessed, pt received and left in recliner)                   Transfers Overall transfer level: Needs assistance Equipment used: Rolling walker (2 wheeled) Transfers: Sit to/from Stand Sit to Stand: Min assist         General transfer comment: heavy minA to stand from recliner  Ambulation/Gait Ambulation/Gait assistance: Min assist Gait Distance (Feet): 2 Feet Assistive device: Rolling walker (2 wheeled) Gait Pattern/deviations: Step-to pattern;Wide base of support Gait velocity: reduced Gait velocity interpretation: <1.31 ft/sec, indicative of household ambulator General Gait Details: reduced foot clearance bilaterally, pt takes 2 steps forward and backward, limited by heated high flow line but also by fatigue  Stairs            Wheelchair Mobility    Modified Rankin (Stroke Patients Only)       Balance Overall balance assessment: Needs assistance Sitting-balance support: No upper extremity supported;Feet supported Sitting balance-Leahy Scale: Fair     Standing balance support: Bilateral upper extremity supported Standing balance-Leahy Scale: Poor Standing balance comment: reliant on BUE support and minG-minA                             Pertinent Vitals/Pain Pain Assessment: No/denies pain    Home Living Family/patient expects to be discharged to:: Private residence Living Arrangements: Spouse/significant other;Children Available Help at Discharge: Family;Available PRN/intermittently Type of Home: House Home Access: Ramped entrance     Home Layout: Two level;Able to live on main level with bedroom/bathroom Home Equipment: Bedside commode;Other (comment);Wheelchair - manual;Cane - single point;Walker - 2 wheels;Walker - 4 wheels Additional Comments: has bariatric bed in [employer]    Prior Function Level of Independence: Needs assistance   Gait / Transfers Assistance Needed: pt  reports one fall in september. Pt stands and pivots from bed to bedside commode, occasionally walks from recliner  to kitchen. Utilizes wheelchair for all mobility of longer distances.  ADL's / Homemaking Assistance Needed: spouse assists with sponge bathing, getting legs in/out of bed, spouse will assist with LB dressing as needed. patient can get herself to bedside commode during the day.        Hand Dominance   Dominant Hand: Right    Extremity/Trunk Assessment   Upper Extremity Assessment Upper Extremity Assessment: Generalized weakness    Lower Extremity Assessment Lower Extremity Assessment: Generalized weakness    Cervical / Trunk Assessment Cervical / Trunk Assessment: Other exceptions Cervical / Trunk Exceptions: morbid obesity  Communication   Communication: No difficulties  Cognition Arousal/Alertness: Awake/alert Behavior During Therapy: WFL for tasks assessed/performed Overall Cognitive Status: Within Functional Limits for tasks assessed                                        General Comments General comments (skin integrity, edema, etc.): pt on 20L HHFNC, desats to 80s with brief bout of mobility. Pt takes 2-3 minutes to recover to baseline RR.    Exercises General Exercises - Lower Extremity Ankle Circles/Pumps: AROM;Both;10 reps Gluteal Sets: AROM;Both;10 reps Long Arc Quad: AROM;Both;10 reps Hip ABduction/ADduction: AROM;Both;10 reps;Seated Hip Flexion/Marching: AROM;Both;10 reps;Seated   Assessment/Plan    PT Assessment Patient needs continued PT services  PT Problem List Decreased strength;Decreased activity tolerance;Decreased balance;Decreased mobility;Cardiopulmonary status limiting activity       PT Treatment Interventions Wheelchair mobility training;DME instruction;Functional mobility training;Gait training;Therapeutic activities;Therapeutic exercise;Balance training;Neuromuscular re-education;Patient/family education    PT Goals (Current goals can be found in the Care Plan section)  Acute Rehab PT Goals Patient Stated Goal: to improve  strength and activity tolerance and return home PT Goal Formulation: With patient Time For Goal Achievement: 12/01/20 Potential to Achieve Goals: Fair Additional Goals Additional Goal #1: Pt will report 4/10 DOE or less when transferring from bed to bedside commode to indicate improved activity tolerance    Frequency Min 3X/week   Barriers to discharge        Co-evaluation               AM-PAC PT "6 Clicks" Mobility  Outcome Measure Help needed turning from your back to your side while in a flat bed without using bedrails?: A Lot Help needed moving from lying on your back to sitting on the side of a flat bed without using bedrails?: A Lot Help needed moving to and from a bed to a chair (including a wheelchair)?: A Little Help needed standing up from a chair using your arms (e.g., wheelchair or bedside chair)?: A Little Help needed to walk in hospital room?: A Lot Help needed climbing 3-5 steps with a railing? : A Lot 6 Click Score: 14    End of Session Equipment Utilized During Treatment: Oxygen Activity Tolerance: Patient limited by fatigue Patient left: in chair;with call bell/phone within reach Nurse Communication: Mobility status PT Visit Diagnosis: Other abnormalities of gait and mobility (R26.89);Muscle weakness (generalized) (M62.81);History of falling (Z91.81)    Time: 1239-1310 PT Time Calculation (min) (ACUTE ONLY): 31 min   Charges:   PT Evaluation $PT Eval Moderate Complexity: 1 Mod          Zenaida Niece, PT, DPT Acute Rehabilitation Pager: [phone] Office [phone]   Lillia Carmel  Truman Hayward 11/17/2020, 2:33 PM

## 2020-11-17 NOTE — Progress Notes (Signed)
PROGRESS NOTE  April [name] YKD:[id number] DOB: 1950-02-12   PCP: [name], MD  Patient is from: Home.  Lives with husband.  Uses walker at baseline.  DOA: 11/15/2020 LOS: 1  Chief complaints:  Chief Complaint  Patient presents with   Shortness of Breath     Brief Narrative / Interim history: 70 year old F with PMH of diastolic CHF, DM-2, CKD 3, morbid obesity, asthma, OSA not on CPAP, DVT on Eliquis, CAD and ambulatory dysfunction presenting from advanced heart failure clinic with hypoxia to 77%, generalized weakness and near syncope, and admitted for acute on chronic hypoxic and hypercapnic respiratory failure and CHF exacerbation requiring BiPAP.  Cardiology following.  Subjective: Seen and examined earlier this morning.  No major events overnight or this morning.  She says she feels the same from breathing standpoint.  Feels cold.  She also reports headache.  She says she could not sleep last night.  She denies chest pain.  Objective: Vitals:   11/17/20 0532 11/17/20 0730 11/17/20 0750 11/17/20 1126  BP:   (!) 155/73 (!) 106/56  Pulse:  81 62 76  Resp:  16 17 19   Temp:   99.3 F (37.4 C) 98.4 F (36.9 C)  TempSrc:   Oral Oral  SpO2:  100% (!) 87% 90%  Weight: (!) 166.8 kg     Height:        Intake/Output Summary (Last 24 hours) at 11/17/2020 1429 Last data filed at 11/17/2020 0939 Gross per 24 hour  Intake --  Output 1450 ml  Net -1450 ml   Filed Weights   11/15/20 1247 11/17/20 0532  Weight: (!) 163.3 kg (!) 166.8 kg    Examination:  GENERAL: No apparent distress.  Nontoxic. HEENT: MMM.  Vision and hearing grossly intact.  NECK: Supple.  Not able to assess JVD due to body habitus. RESP: 92% on 20 L / 30% by HFNC.  No IWOB.  Fair aeration but limited exam due to body habitus. CVS:  RRR. Heart sounds normal.  ABD/GI/GU: BS+. Abd soft, NTND.  MSK/EXT:  Moves extremities.  Bilateral lower extremity weakness. SKIN: no apparent skin lesion or  wound NEURO: Sleepy but wakes to voice easily.  Not quite alert.  Oriented x4.  No apparent focal neuro deficit. PSYCH: Calm. Normal affect.   Procedures:  None  Microbiology summarized: [id number] and influenza PCR nonreactive.  Assessment & Plan: Acute on chronic respiratory failure with hypoxia and hypercapnia-ABG 7.29/80/69/41 suggesting some acute on chronic respiratory acidosis.  Likely driven by obstructive process/OSA/possible OHS.   Doubt PE.  She is on Eliquis for DVT although efficacy not well studied in patient population like her.  Requiring 20 L with 30% FiO2 by HFNC -Minimal oxygen to keep saturation above 88%.  Discussed with patient and patient's RN -Manage CHF as below -Needs BiPAP when asleep and at night -Outpatient follow-up with pulmonology for sleep study as previously planned -Avoid or minimize sedating medications -May consider changing Coreg to Bystolic with beta beta-1 selectivity.  Acute on chronic diastolic CHF: TTE in 02/9377 with LVEF of 60 to 65%, G2 DD, and RVSP of 66 mmHg.  Difficult to assess fluid status due to morbid obesity with BMI of 62.  Seems to be improving with diuretics.  About 1.3 L UOP/24 hours charted.  Net -4 L so far.  Renal function is stable. -BiPAP as above -Defer diuretics to cardiology -Follow echocardiogram. -Monitor fluid status, renal functions and electrolytes -Sodium and fluid restriction  History of  asthma-doubt this is playing a role -Continue home inhalers with as needed DuoNebs  AKI on CKD-3A: Improving. Recent Labs    05/13/20 0334 05/15/20 0428 06/11/20 0955 08/14/20 1150 08/30/20 1130 09/10/20 1553 10/02/20 1039 11/15/20 1247 11/16/20 0344 11/17/20 0121  BUN 27* 38* 21 19 19 20 22 21 17 21   CREATININE 1.56* 1.50* 1.46* 1.54* 1.41* 1.26* 1.40* 1.65* 1.38* 1.40*  -Continue monitoring  CAD: Patient denies chest pain.  EKG and troponin reassuring. -Continue home cardiac meds  History of BLE DVT-low suspicion  for PE -Continue home Eliquis  Mild thrombocytopenia: -Continue monitoring  Morbid obesity-this complicates patient's ability to ambulate Body mass index is 63.12 kg/m. -Encourage lifestyle change to lose weight -Bariatric surgery?         DVT prophylaxis:   apixaban (ELIQUIS) tablet 5 mg  Code Status: Full code Family Communication: Patient and/or RN. Available if any question.  Level of care: Progressive Status is: Inpatient  Remains inpatient appropriate because: due to significantly increased oxygen requirement and need for IV diuretics         Consultants:  Cardiology   Sch Meds:  Scheduled Meds:  (feeding supplement) PROSource Plus  30 mL Oral BID BM   allopurinol  200 mg Oral Daily   apixaban  5 mg Oral BID   carvedilol  12.5 mg Oral BID WC   cholecalciferol  1,000 Units Oral Daily   famotidine  20 mg Oral BID   [START ON 11/18/2020] ferrous sulfate  325 mg Oral Q M,W,F   folic acid  0,093 mcg Oral QHS   furosemide  80 mg Intravenous BID   gabapentin  100 mg Oral TID   mometasone-formoterol  2 puff Inhalation BID   montelukast  10 mg Oral QHS   Oxcarbazepine  300 mg Oral BID   potassium chloride SA  20 mEq Oral BID   simvastatin  40 mg Oral QHS   spironolactone  25 mg Oral Daily   Continuous Infusions: PRN Meds:.acetaminophen, albuterol, diclofenac Sodium, hydrOXYzine, ondansetron **OR** ondansetron (ZOFRAN) IV, polyethylene glycol, senna-docusate  Antimicrobials: Anti-infectives (From admission, onward)    None        I have personally reviewed the following labs and images: CBC: Recent Labs  Lab 11/15/20 1247 11/15/20 1900 11/17/20 0121  WBC 6.0  --  5.9  NEUTROABS 4.6  --   --   HGB 12.3 13.6 10.9*  HCT 41.3 40.0 37.5  MCV 103.0*  --  105.0*  PLT 166  --  143*   BMP &GFR Recent Labs  Lab 11/15/20 1247 11/15/20 1900 11/16/20 0344 11/17/20 0121  NA 139 141 140 136  K 4.6 4.2 4.3 4.7  CL 98  --  97* 96*  CO2 33*  --  35*  31  GLUCOSE 130*  --  105* 119*  BUN 21  --  17 21  CREATININE 1.65*  --  1.38* 1.40*  CALCIUM 9.0  --  8.6* 8.2*  MG  --   --   --  2.2  PHOS  --   --   --  4.5   Estimated Creatinine Clearance: 58.7 mL/min (A) (by C-G formula based on SCr of 1.4 mg/dL (H)). Liver & Pancreas: Recent Labs  Lab 11/17/20 0121  ALBUMIN 2.8*   No results for input(s): LIPASE, AMYLASE in the last 168 hours. Recent Labs  Lab 11/15/20 1543  AMMONIA <10   Diabetic: No results for input(s): HGBA1C in the last 72 hours. Recent Labs  Lab 11/16/20 1125 11/16/20 1535 11/16/20 2114 11/17/20 0653 11/17/20 1127  GLUCAP 122* 109* 100* 125* 176*   Cardiac Enzymes: No results for input(s): CKTOTAL, CKMB, CKMBINDEX, TROPONINI in the last 168 hours. No results for input(s): PROBNP in the last 8760 hours. Coagulation Profile: No results for input(s): INR, PROTIME in the last 168 hours. Thyroid Function Tests: Recent Labs    11/15/20 1543  TSH 4.317   Lipid Profile: No results for input(s): CHOL, HDL, LDLCALC, TRIG, CHOLHDL, LDLDIRECT in the last 72 hours. Anemia Panel: No results for input(s): VITAMINB12, FOLATE, FERRITIN, TIBC, IRON, RETICCTPCT in the last 72 hours. Urine analysis:    Component Value Date/Time   COLORURINE STRAW (A) 02/15/2019 1856   APPEARANCEUR CLEAR 02/15/2019 1856   LABSPEC 1.006 02/15/2019 1856   PHURINE 8.0 02/15/2019 1856   GLUCOSEU NEGATIVE 02/15/2019 1856   GLUCOSEU NEGATIVE 02/19/2015 1707   HGBUR NEGATIVE 02/15/2019 1856   BILIRUBINUR NEGATIVE 02/15/2019 1856   BILIRUBINUR negative 09/05/2013 0832   KETONESUR NEGATIVE 02/15/2019 1856   PROTEINUR NEGATIVE 02/15/2019 1856   UROBILINOGEN 2.0 (A) 02/19/2015 1707   NITRITE NEGATIVE 02/15/2019 1856   LEUKOCYTESUR NEGATIVE 02/15/2019 1856   Sepsis Labs: Invalid input(s): PROCALCITONIN, [place]  Microbiology: Recent Results (from the past 240 hour(s))  Resp Panel by RT-PCR (Flu A&B, Covid) Nasopharyngeal Swab      Status: None   Collection Time: 11/15/20 12:50 PM   Specimen: Nasopharyngeal Swab; Nasopharyngeal(NP) swabs in vial transport medium  Result Value Ref Range Status   SARS Coronavirus 2 by RT PCR NEGATIVE NEGATIVE Final    Comment: (NOTE) SARS-CoV-2 target nucleic acids are NOT DETECTED.  The SARS-CoV-2 RNA is generally detectable in upper respiratory specimens during the acute phase of infection. The lowest concentration of SARS-CoV-2 viral copies this assay can detect is 138 copies/mL. A negative result does not preclude SARS-Cov-2 infection and should not be used as the sole basis for treatment or other patient management decisions. A negative result may occur with  improper specimen collection/handling, submission of specimen other than nasopharyngeal swab, presence of viral mutation(s) within the areas targeted by this assay, and inadequate number of viral copies(<138 copies/mL). A negative result must be combined with clinical observations, patient history, and epidemiological information. The expected result is Negative.  Fact Sheet for Patients:  EntrepreneurPulse.com.au  Fact Sheet for Healthcare Providers:  IncredibleEmployment.be  This test is no t yet approved or cleared by the [place] FDA and  has been authorized for detection and/or diagnosis of SARS-CoV-2 by FDA under an Emergency Use Authorization (EUA). This EUA will remain  in effect (meaning this test can be used) for the duration of the COVID-19 declaration under Section 564(b)(1) of the Act, 21 U.S.C.section 360bbb-3(b)(1), unless the authorization is terminated  or revoked sooner.       Influenza A by PCR NEGATIVE NEGATIVE Final   Influenza B by PCR NEGATIVE NEGATIVE Final    Comment: (NOTE) The Xpert Xpress SARS-CoV-2/FLU/RSV plus assay is intended as an aid in the diagnosis of influenza from Nasopharyngeal swab specimens and should not be used as a sole basis  for treatment. Nasal washings and aspirates are unacceptable for Xpert Xpress SARS-CoV-2/FLU/RSV testing.  Fact Sheet for Patients: EntrepreneurPulse.com.au  Fact Sheet for Healthcare Providers: IncredibleEmployment.be  This test is not yet approved or cleared by the [place] FDA and has been authorized for detection and/or diagnosis of SARS-CoV-2 by FDA under an Emergency Use Authorization (EUA). This EUA will remain in effect (meaning  this test can be used) for the duration of the COVID-19 declaration under Section 564(b)(1) of the Act, 21 U.S.C. section 360bbb-3(b)(1), unless the authorization is terminated or revoked.  Performed at [facility], [place] [address] [place], [place] [address]     Radiology Studies: No results found.     Katharine Rochefort T. McMillin  If 7PM-7AM, please contact night-coverage [email or web address] 11/17/2020, 2:29 PM

## 2020-11-18 ENCOUNTER — Inpatient Hospital Stay (HOSPITAL_COMMUNITY): Payer: Medicare PPO

## 2020-11-18 DIAGNOSIS — I5033 Acute on chronic diastolic (congestive) heart failure: Secondary | ICD-10-CM | POA: Diagnosis not present

## 2020-11-18 DIAGNOSIS — J9622 Acute and chronic respiratory failure with hypercapnia: Secondary | ICD-10-CM

## 2020-11-18 DIAGNOSIS — J9621 Acute and chronic respiratory failure with hypoxia: Secondary | ICD-10-CM | POA: Diagnosis not present

## 2020-11-18 LAB — RENAL FUNCTION PANEL
Albumin: 2.7 g/dL — ABNORMAL LOW (ref 3.5–5.0)
Anion gap: 7 (ref 5–15)
BUN: 21 mg/dL (ref 8–23)
CO2: 34 mmol/L — ABNORMAL HIGH (ref 22–32)
Calcium: 8.3 mg/dL — ABNORMAL LOW (ref 8.9–10.3)
Chloride: 97 mmol/L — ABNORMAL LOW (ref 98–111)
Creatinine, Ser: 1.39 mg/dL — ABNORMAL HIGH (ref 0.44–1.00)
GFR, Estimated: 41 mL/min — ABNORMAL LOW (ref >60)
Glucose, Bld: 128 mg/dL — ABNORMAL HIGH (ref 70–99)
Phosphorus: 3.6 mg/dL (ref 2.5–4.6)
Potassium: 4 mmol/L (ref 3.5–5.1)
Sodium: 138 mmol/L (ref 135–145)

## 2020-11-18 LAB — ECHOCARDIOGRAM COMPLETE
AR max vel: 2.1 cm2
AV Area VTI: 2.11 cm2
AV Area mean vel: 2.05 cm2
AV Mean grad: 5 mmHg
AV Peak grad: 9.1 mmHg
Ao pk vel: 1.51 m/s
Area-P 1/2: 2.8 cm2
Height: 64 in
S' Lateral: 2.5 cm
Weight: 5851.89 oz

## 2020-11-18 LAB — GLUCOSE, CAPILLARY
Glucose-Capillary: 122 mg/dL — ABNORMAL HIGH (ref 70–99)
Glucose-Capillary: 136 mg/dL — ABNORMAL HIGH (ref 70–99)
Glucose-Capillary: 89 mg/dL (ref 70–99)
Glucose-Capillary: 104 mg/dL — ABNORMAL HIGH (ref 70–99)

## 2020-11-18 LAB — CBC
HCT: 35.1 % — ABNORMAL LOW (ref 36.0–46.0)
Hemoglobin: 10.2 g/dL — ABNORMAL LOW (ref 12.0–15.0)
MCH: 30.4 pg (ref 26.0–34.0)
MCHC: 29.1 g/dL — ABNORMAL LOW (ref 30.0–36.0)
MCV: 104.8 fL — ABNORMAL HIGH (ref 80.0–100.0)
Platelets: 142 10*3/uL — ABNORMAL LOW (ref 150–400)
RBC: 3.35 MIL/uL — ABNORMAL LOW (ref 3.87–5.11)
RDW: 16.9 % — ABNORMAL HIGH (ref 11.5–15.5)
WBC: 5.6 10*3/uL (ref 4.0–10.5)
nRBC: 0.7 % — ABNORMAL HIGH (ref 0.0–0.2)

## 2020-11-18 LAB — BRAIN NATRIURETIC PEPTIDE: B Natriuretic Peptide: 110.8 pg/mL — ABNORMAL HIGH (ref 0.0–100.0)

## 2020-11-18 LAB — MAGNESIUM: Magnesium: 2.3 mg/dL (ref 1.7–2.4)

## 2020-11-18 MED ORDER — DICLOFENAC SODIUM 1 % EX GEL
2.0000 g | Freq: Four times a day (QID) | CUTANEOUS | Status: DC
  Filled 2020-11-18: qty 100

## 2020-11-18 MED ORDER — ATORVASTATIN CALCIUM 40 MG PO TABS
40.0000 mg | ORAL_TABLET | Freq: Every day | ORAL | Status: DC
  Administered 2020-11-18 – 2020-11-21 (×4): 40 mg via ORAL
  Filled 2020-11-18 (×4): qty 1

## 2020-11-18 MED ORDER — NEBIVOLOL HCL 5 MG PO TABS
5.0000 mg | ORAL_TABLET | Freq: Every day | ORAL | Status: DC
  Administered 2020-11-18 – 2020-11-22 (×4): 5 mg via ORAL
  Filled 2020-11-18: qty 1
  Filled 2020-11-18: qty 2
  Filled 2020-11-18 (×5): qty 1

## 2020-11-18 MED ORDER — ACETAZOLAMIDE 250 MG PO TABS
250.0000 mg | ORAL_TABLET | Freq: Two times a day (BID) | ORAL | Status: DC
  Administered 2020-11-18 – 2020-11-22 (×9): 250 mg via ORAL
  Filled 2020-11-18 (×9): qty 1

## 2020-11-18 MED ORDER — SALINE SPRAY 0.65 % NA SOLN
1.0000 | NASAL | Status: DC | PRN
  Administered 2020-11-18: 1 via NASAL
  Filled 2020-11-18: qty 44

## 2020-11-18 NOTE — Evaluation (Signed)
Occupational Therapy Evaluation Patient Details Name: April [name] MRN: [id number] DOB: 1950/12/01 Today's Date: 11/18/2020   History of Present Illness 70 y.o. female presents to [facility] hospital on 11/15/2020 from advanced heart failure clinic with hypoxia, weakness, and near syncope. Pt admitted for acute on chronic hypoxic and hypercapnic respiratory failure and CHF exacerbation requiring BiPAP. PMH includes diastolic CHF, DM-2, CKD 3, morbid obesity, asthma, OSA not on CPAP, DVT on Eliquis, CAD and ambulatory dysfunction.   Clinical Impression   Pt admitted with the above diagnosis and overall has the deficits outlined below. Pt would benefit from cont OT to increase strength and endurance with all adls and adl transfers. Pt has become very sedentary at home, basically transferring from bed to recliner to [facility] only which has taken a toll on her UE strength and her activity tolerance. Pt fatigues very quickly, is unable to reach overhead and has become dependent on husband and caregivers for adls.  Feel pt has the potential to do more for herself in the area of adls to get stronger and become more independent.  Will continue to work with pt acutely on these tasks.  A short stay at SNF might help pt start this process but pt is against SNF so will recommend [facility].       Recommendations for follow up therapy are one component of a multi-disciplinary discharge planning process, led by the attending physician.  Recommendations may be updated based on patient status, additional functional criteria and insurance authorization.   Follow Up Recommendations  Home health OT;Supervision/Assistance - 24 hour    Equipment Recommendations  None recommended by OT    Recommendations for Other Services       Precautions / Restrictions Precautions Precautions: Fall Precaution Comments: heated high flow Restrictions Weight Bearing Restrictions: No      Mobility Bed Mobility Overal bed mobility:  Needs Assistance Bed Mobility: Supine to Sit     Supine to sit: Min assist;HOB elevated     General bed mobility comments: Pt heavily relies on HOB being up and bed rails to get to EOB. Pt has hospital bed at home.    Transfers Overall transfer level: Needs assistance Equipment used: 1 person hand held assist Transfers: Sit to/from [facility] Sit to Stand: Min assist Stand pivot transfers: Min assist       General transfer comment: heavy minA to stand from recliner    Balance Overall balance assessment: Needs assistance Sitting-balance support: No upper extremity supported;Feet supported Sitting balance-Leahy Scale: Fair   Postural control: Posterior lean Standing balance support: Bilateral upper extremity supported Standing balance-Leahy Scale: Poor Standing balance comment: reliant on BUE support and minG-minA                           ADL either performed or assessed with clinical judgement   ADL Overall ADL's : Needs assistance/impaired Eating/Feeding: Independent;Sitting   Grooming: Wash/dry hands;Wash/dry face;Oral care;Applying deodorant;Sitting;Moderate assistance Grooming Details (indicate cue type and reason): mod assist only needed to reach overhead. Pt with BUE shoulder weakness that does not allow her to reach overhead unassisted. Upper Body Bathing: Moderate assistance;Sitting   Lower Body Bathing: Maximal assistance;Sit to/from stand;Cueing for compensatory techniques Lower Body Bathing Details (indicate cue type and reason): Pt's body habitus makes if very difficult for her to thoroughly bathe.  Pt has aid that comes twice a week to give pt a good sponge bathe.  Pt does what she can  in between with assist of her husband. Upper Body Dressing : Minimal assistance;Sitting Upper Body Dressing Details (indicate cue type and reason): difficulty with overhead shirts due to shoulder weakness Lower Body Dressing: Maximal assistance;Sit  to/from stand;Cueing for compensatory techniques   Toilet Transfer: Minimal assistance;BSC;Stand-pivot   Toileting- Clothing Manipulation and Hygiene: Maximal assistance;Sit to/from Nurse, children's Details (indicate cue type and reason): pt sponge bathing only at this point Functional mobility during ADLs: +2 for physical assistance General ADL Comments: Pt limited with all adls due to UE weakness, poor balance and leg strength and poor activity tolerance.  Pt is not active at home.  Pt basically transfers from bed to [facility] to recliner only.  Shoulders have become weak as well with immobility.  Talked at length about calling nursing to go to the bathroom instead of depending on purwik just to practice getting OOB more.  Will speak to nursing about this.     Vision Baseline Vision/History: 1 Wears glasses Ability to See in Adequate Light: 0 Adequate Patient Visual Report: No change from baseline Vision Assessment?: No apparent visual deficits     Perception Perception Perception Tested?: No   Praxis Praxis Praxis tested?: Within functional limits    Pertinent Vitals/Pain Pain Assessment: No/denies pain     Hand Dominance Right   Extremity/Trunk Assessment Upper Extremity Assessment Upper Extremity Assessment: Generalized weakness   Lower Extremity Assessment Lower Extremity Assessment: Defer to PT evaluation   Cervical / Trunk Assessment Cervical / Trunk Assessment: Other exceptions Cervical / Trunk Exceptions: morbid obesity   Communication Communication Communication: No difficulties   Cognition Arousal/Alertness: Awake/alert Behavior During Therapy: WFL for tasks assessed/performed Overall Cognitive Status: Within Functional Limits for tasks assessed                                     General Comments  Pt limited by poor endurance and body habitus. Pt not motivated to do much more than what she does at home.    Exercises      Shoulder Instructions      Home Living Family/patient expects to be discharged to:: Private residence Living Arrangements: Spouse/significant other;Children Available Help at Discharge: Family;Available PRN/intermittently Type of Home: House Home Access: Ramped entrance     Home Layout: Two level;Able to live on main level with bedroom/bathroom     Bathroom Shower/Tub: Tub/shower unit;Other (comment) (shower is upstairs. Inaccessible at this point)   Bathroom Toilet: Standard     Home Equipment: Bedside commode;Other (comment);Wheelchair - manual;Cane - single point;Walker - 2 wheels;Walker - 4 wheels   Additional Comments: has bariatric bed in [employer]      Prior Functioning/Environment Level of Independence: Needs assistance  Gait / Transfers Assistance Needed: pt reports one fall in september. Pt stands and pivots from bed to bedside commode, occasionally walks from recliner to kitchen. Utilizes wheelchair for all mobility of longer distances. ADL's / Homemaking Assistance Needed: spouse assists with sponge bathing, getting legs in/out of bed, spouse will assist with LB dressing as needed. patient can get herself to bedside commode during the day.   Comments: Pt basically stays in her recliner most of the day. Her BSC is right beside her and the bed is on the other side. Pt can get self to [facility] and back. Husband walks pt to kitchen with walker most but not all days for a total of about  40 feet.  Pt very sedentary at home.        OT Problem List: Decreased strength;Decreased range of motion;Decreased activity tolerance;Impaired balance (sitting and/or standing);Decreased knowledge of use of DME or AE;Cardiopulmonary status limiting activity;Obesity;Impaired UE functional use;Pain      OT Treatment/Interventions: Self-care/ADL training;Therapeutic activities;DME and/or AE instruction;Therapeutic exercise    OT Goals(Current goals can be found in the care plan section)  Acute Rehab OT Goals Patient Stated Goal: to improve strength and activity tolerance and return home OT Goal Formulation: With patient Time For Goal Achievement: 12/02/20 Potential to Achieve Goals: Good ADL Goals Pt Will Perform Grooming: with supervision;sitting Pt Will Perform Lower Body Bathing: with min assist;with adaptive equipment Pt Will Perform Lower Body Dressing: with min assist;sit to/from stand;with adaptive equipment Pt Will Transfer to Toilet: with supervision;ambulating;bedside commode Pt/caregiver will Perform Home Exercise Program: Increased strength;Left upper extremity;Right Upper extremity;With Supervision;With written HEP provided  OT Frequency: Min 2X/week   Barriers to D/C:    husband around most of the time       Co-evaluation              AM-PAC OT "6 Clicks" Daily Activity     Outcome Measure Help from another person eating meals?: None Help from another person taking care of personal grooming?: A Little Help from another person toileting, which includes using toliet, bedpan, or urinal?: A Lot Help from another person bathing (including washing, rinsing, drying)?: A Lot Help from another person to put on and taking off regular upper body clothing?: A Little Help from another person to put on and taking off regular lower body clothing?: A Lot 6 Click Score: 16   End of Session Equipment Utilized During Treatment: Oxygen Nurse Communication: Mobility status  Activity Tolerance: Patient limited by fatigue Patient left: in chair;with call bell/phone within reach  OT Visit Diagnosis: Unsteadiness on feet (R26.81);Muscle weakness (generalized) (M62.81)                Time: 5364-6803 OT Time Calculation (min): 38 min Charges:  OT General Charges $OT Visit: 1 Visit OT Evaluation $OT Eval Moderate Complexity: 1 Mod OT Treatments $Self Care/Home Management : 8-22 mins  Glenford Peers 11/18/2020, 10:57 AM

## 2020-11-18 NOTE — Progress Notes (Signed)
  Echocardiogram 2D Echocardiogram has been performed.  Merrie Roof F 11/18/2020, 3:42 PM

## 2020-11-18 NOTE — Progress Notes (Signed)
Physical Therapy Treatment Patient Details Name: April [name] MRN: [id number] DOB: 22-Jan-1951 Today's Date: 11/18/2020   History of Present Illness 70 y.o. female presents to [facility] hospital on 11/15/2020 from advanced heart failure clinic with hypoxia, weakness, and near syncope. Pt admitted for acute on chronic hypoxic and hypercapnic respiratory failure and CHF exacerbation requiring BiPAP. PMH includes diastolic CHF, DM-2, CKD 3, morbid obesity, asthma, OSA not on CPAP, DVT on Eliquis, CAD and ambulatory dysfunction.    PT Comments    Pt remains limited by poor activity tolerance and decr mobilty. Pt with very low baseline of mobility at home which contributes to long term deconditioning.    Recommendations for follow up therapy are one component of a multi-disciplinary discharge planning process, led by the attending physician.  Recommendations may be updated based on patient status, additional functional criteria and insurance authorization.  Follow Up Recommendations  Home health PT;Supervision for mobility/OOB (pt adamantly refuses SNF)     Equipment Recommendations  None recommended by PT (Pt has necessary DME)    Recommendations for Other Services       Precautions / Restrictions Precautions Precautions: Fall Precaution Comments: heated high flow     Mobility  Bed Mobility               General bed mobility comments: Pt up in chair    Transfers Overall transfer level: Needs assistance Equipment used: 4-wheeled walker Transfers: Sit to/from Stand Sit to Stand: Min assist         General transfer comment: Assist to bring hips up from chair. Pt with incr time and effort to rise.  Ambulation/Gait Ambulation/Gait assistance: Min assist Gait Distance (Feet): 2 Feet Assistive device: 4-wheeled walker Gait Pattern/deviations: Step-to pattern;Decreased step length - right;Decreased step length - left;Shuffle;Wide base of support Gait velocity: decr Gait  velocity interpretation: <1.31 ft/sec, indicative of household ambulator General Gait Details: Assist for balance and safety. Distance limited by heated high flow O2 and by fatigue   Stairs             Wheelchair Mobility    Modified Rankin (Stroke Patients Only)       Balance Overall balance assessment: Needs assistance Sitting-balance support: No upper extremity supported;Feet supported Sitting balance-Leahy Scale: Fair     Standing balance support: Bilateral upper extremity supported Standing balance-Leahy Scale: Poor Standing balance comment: rollator and min guard for static standing. Stood x 2 for 45-60 seconds                            Cognition Arousal/Alertness: Awake/alert Behavior During Therapy: WFL for tasks assessed/performed Overall Cognitive Status: Within Functional Limits for tasks assessed                                        Exercises      General Comments General comments (skin integrity, edema, etc.): VSS on heated high flow at 20L      Pertinent Vitals/Pain Pain Assessment: No/denies pain    Home Living                      Prior Function            PT Goals (current goals can now be found in the care plan section) Acute Rehab PT Goals Patient Stated Goal: return home Progress towards  PT goals: Progressing toward goals    Frequency    Min 3X/week      PT Plan Current plan remains appropriate    Co-evaluation              AM-PAC PT "6 Clicks" Mobility   Outcome Measure  Help needed turning from your back to your side while in a flat bed without using bedrails?: A Lot Help needed moving from lying on your back to sitting on the side of a flat bed without using bedrails?: A Lot Help needed moving to and from a bed to a chair (including a wheelchair)?: A Little Help needed standing up from a chair using your arms (e.g., wheelchair or bedside chair)?: A Little Help needed to  walk in hospital room?: A Lot Help needed climbing 3-5 steps with a railing? : Total 6 Click Score: 13    End of Session Equipment Utilized During Treatment: Oxygen Activity Tolerance: Patient limited by fatigue Patient left: in chair;with call bell/phone within reach   PT Visit Diagnosis: Other abnormalities of gait and mobility (R26.89);Muscle weakness (generalized) (M62.81);History of falling (Z91.81)     Time: 4171-2787 PT Time Calculation (min) (ACUTE ONLY): 12 min  Charges:  $Therapeutic Activity: 8-22 mins                     [place] Pager [phone] Office [place] 11/18/2020, 2:30 PM

## 2020-11-18 NOTE — Plan of Care (Signed)

## 2020-11-18 NOTE — Consult Note (Signed)
NAME:  April [name], MRN:  [id number], DOB:  Jun 03, 1950, LOS: 2 ADMISSION DATE:  11/15/2020, CONSULTATION DATE: 11/18/2020 REFERRING MD: Triad, CHIEF COMPLAINT: Hypoxia hypercarbia  History of Present Illness:  70 year old female who is morbidly obese is 368 pounds with a history of obstructive sleep apnea diagnosed 5 years ago by Dr. Gwenette Greet of pulmonary division and recently seen in the hospital 05/24/2020 with lower extremity for which she was placed on Eliquis and was to be set up for a sleep study and she was seen by Dr. Beckie Salts the pulmonary division and has been scheduled for a sleep study.  Her arterial blood gas with pH 7.29 PCO2 of 80 and PO2 of 60 is consistent with obesity hypoventilation syndrome.  She reports having trouble breathing for going on 2 months.  She also has increased weakness difficulty ambulating and has had difficulty sleeping for years.  Her sleep disruption is magnified by her osteoarthritis generalized pain in her obstructive sleep apnea.  She is improved since she has been in the hospital with aggressive diuresis she is currently sitting up in chair eating awake alert neurologically intact and in no respiratory distress during examination.  She has been instructed she needs to wear her BiPAP alternatives of tracheostomy is offered to her which she showed little interest in.  If she will wear her BiPAP significant.  Tonight I believe she will improve.  If not the most likely she will continue to deteriorate over time.  Pertinent  Medical History   Past Medical History:  Diagnosis Date   Anemia    Asthma    CAD (coronary artery disease)    Carpal tunnel syndrome    Cellulitis of both lower extremities 04/11/2015   CHF (congestive heart failure) (HCC)    Chronic kidney disease    [facility]- followed by [facility]   Colon polyp, hyperplastic 6789 & 3810   Complication of anesthesia 1999   svt with renal calculi surgery, no problems since   CTS (carpal tunnel  syndrome)    bilateral   Diabetes mellitus    Eczema    GERD (gastroesophageal reflux disease)    History of kidney stones 1999   Hyperlipidemia    Hypertension    Leg ulcer ([place]) 04/24/2015   Right lateral leg No evidence of an infection Monitor closely Keep edema controlled    Leg ulcer (HCC)    right lower   Meralgia paresthetica    Dr. [name]   Morbid obesity ([place])    Neuropathy    toes and legs   Osteoarthrosis, unspecified whether generalized or localized, lower leg    knee   PUD (peptic ulcer disease)    Shortness of breath dyspnea    with exertion   Sleep apnea    per progress note 02/25/2018   Type II or unspecified type diabetes mellitus without mention of complication, not stated as uncontrolled    Unspecified hereditary and idiopathic peripheral neuropathy    Urticaria    Vitamin B12 deficiency    Wears glasses    Wound cellulitis    right upper leg, healing well     Significant Hospital Events: Including procedures, antibiotic start and stop dates in addition to other pertinent events     Interim History / Subjective:  Sitting up in chair eating with gusto  Objective   Blood pressure 130/66, pulse 83, temperature 98.8 F (37.1 C), resp. rate 15, height 5\' 4"  (1.626 m), weight (!) 165.9 kg, SpO2 90 %.  FiO2 (%):  [30 %-40 %] 40 %   Intake/Output Summary (Last 24 hours) at 11/18/2020 1232 Last data filed at 11/18/2020 0737 Gross per 24 hour  Intake 717 ml  Output 1100 ml  Net -383 ml   Filed Weights   11/15/20 1247 11/17/20 0532 11/18/20 0449  Weight: (!) 163.3 kg (!) 166.8 kg (!) 165.9 kg    Examination: General: Morbidly obese at 368 pounds, sitting up eating. HENT: No neck cannot appreciate JVD or lymphadenopathy Lungs: Decreased air movement throughout Cardiovascular: Heart sounds are regular Abdomen: Morbidly obese and soft Extremities: Massive edema lower extremities Neuro: Grossly intact without focal defect GU: Voids  Resolved  Hospital Problem list     Assessment & Plan:  Acute on chronic hypercarbic hypoxic respiratory failure in the setting of obstructive sleep apnea/obesity hypoventilation syndrome/morbid obesity/history of asthma /congestive heart failure. O2 as needed to maintain O2 saturations greater than 90% It was explained in great detail to her that she will need to wear BiPAP at night to prevent worsening hypoxia and hypercarbia.  The other option would be a tracheostomy.  She did not want tracheostomy at this time.  She said she would try to wear the BiPAP at night. Weight loss Agree with aggressive diuresis per cardiology Avoid all sedating medication Pulmonary toilet as needed, currently on as needed Proventil along with Dulera twice daily Singulair nightly. She has been seen by the [place] pulmonary and has a sleep study ordered.  She was seen as long as 5 years ago by Dr. Gwenette Greet and was seeing in August by Dr.Olare. Pulmonary critical care will be available as needed.  Renal insufficiency Lab Results  Component Value Date   CREATININE 1.39 (H) 11/18/2020   CREATININE 1.40 (H) 11/17/2020   CREATININE 1.38 (H) 11/16/2020   CREATININE 1.40 (H) 10/02/2020  Improving with diuresis  History of bilateral lower extremity DVT negative PE Continue Eliquis   Best Practice (right click and "Reselect all SmartList Selections" daily)   Diet/type: Regular consistency (see orders) DVT prophylaxis: DOAC GI prophylaxis: PPI Lines: N/A Foley:  N/A Code Status:  full code Last date of multidisciplinary goals of care discussion [TBD]  Labs   CBC: Recent Labs  Lab 11/15/20 1247 11/15/20 1900 11/17/20 0121 11/18/20 0053  WBC 6.0  --  5.9 5.6  NEUTROABS 4.6  --   --   --   HGB 12.3 13.6 10.9* 10.2*  HCT 41.3 40.0 37.5 35.1*  MCV 103.0*  --  105.0* 104.8*  PLT 166  --  143* 142*    Basic Metabolic Panel: Recent Labs  Lab 11/15/20 1247 11/15/20 1900 11/16/20 0344 11/17/20 0121  11/18/20 0053  NA 139 141 140 136 138  K 4.6 4.2 4.3 4.7 4.0  CL 98  --  97* 96* 97*  CO2 33*  --  35* 31 34*  GLUCOSE 130*  --  105* 119* 128*  BUN 21  --  17 21 21   CREATININE 1.65*  --  1.38* 1.40* 1.39*  CALCIUM 9.0  --  8.6* 8.2* 8.3*  MG  --   --   --  2.2 2.3  PHOS  --   --   --  4.5 3.6   GFR: Estimated Creatinine Clearance: 59 mL/min (A) (by C-G formula based on SCr of 1.39 mg/dL (H)). Recent Labs  Lab 11/15/20 1247 11/17/20 0121 11/18/20 0053  WBC 6.0 5.9 5.6    Liver Function Tests: Recent Labs  Lab 11/17/20 0121 11/18/20 0053  ALBUMIN 2.8* 2.7*   No results for input(s): LIPASE, AMYLASE in the last 168 hours. Recent Labs  Lab 11/15/20 1543  AMMONIA <10    ABG    Component Value Date/Time   PHART 7.290 (L) 11/15/2020 1900   PCO2ART 79.9 (HH) 11/15/2020 1900   PO2ART 69 (L) 11/15/2020 1900   HCO3 38.4 (H) 11/15/2020 1900   TCO2 41 (H) 11/15/2020 1900   O2SAT 90.0 11/15/2020 1900     Coagulation Profile: No results for input(s): INR, PROTIME in the last 168 hours.  Cardiac Enzymes: No results for input(s): CKTOTAL, CKMB, CKMBINDEX, TROPONINI in the last 168 hours.  HbA1C: Hemoglobin A1C  Date/Time Value Ref Range Status  09/13/2020 04:56 PM 6.0 (A) 4.0 - 5.6 % Final   HbA1c, POC (prediabetic range)  Date/Time Value Ref Range Status  09/13/2020 04:56 PM 6.0 5.7 - 6.4 % Final   HbA1c, POC (controlled diabetic range)  Date/Time Value Ref Range Status  09/13/2020 04:56 PM 6.0 0.0 - 7.0 % Final   HbA1c POC (<> result, manual entry)  Date/Time Value Ref Range Status  09/13/2020 04:56 PM 6.0 4.0 - 5.6 % Final   Hgb A1c MFr Bld  Date/Time Value Ref Range Status  03/08/2020 01:47 PM 6.4 4.6 - 6.5 % Final    Comment:    Glycemic Control Guidelines for People with Diabetes:Non Diabetic:  <6%Goal of Therapy: <7%Additional Action Suggested:  >8%   09/08/2018 12:20 AM 6.0 (H) 4.8 - 5.6 % Final    Comment:    (NOTE) Pre diabetes:           5.7%-6.4% Diabetes:              >6.4% Glycemic control for   <7.0% adults with diabetes     CBG: Recent Labs  Lab 11/17/20 1127 11/17/20 1524 11/17/20 2207 11/18/20 0643 11/18/20 1106  GLUCAP 176* 88 102* 122* 136*    Review of Systems:   12 point review of system taken, please see HPI for positives and negatives.   Past Medical History:  She,  has a past medical history of Anemia, Asthma, CAD (coronary artery disease), Carpal tunnel syndrome, Cellulitis of both lower extremities (04/11/2015), CHF (congestive heart failure) ([place]), Chronic kidney disease, Colon polyp, hyperplastic (3295 & 1884), Complication of anesthesia (1999), CTS (carpal tunnel syndrome), Diabetes mellitus, Eczema, GERD (gastroesophageal reflux disease), History of kidney stones (1999), Hyperlipidemia, Hypertension, Leg ulcer ([place]) (04/24/2015), Leg ulcer ([place]), Meralgia paresthetica, Morbid obesity ([place]), Neuropathy, Osteoarthrosis, unspecified whether generalized or localized, lower leg, PUD (peptic ulcer disease), Shortness of breath dyspnea, Sleep apnea, Type II or unspecified type diabetes mellitus without mention of complication, not stated as uncontrolled, Unspecified hereditary and idiopathic peripheral neuropathy, Urticaria, Vitamin B12 deficiency, Wears glasses, and Wound cellulitis.   Surgical History:   Past Surgical History:  Procedure Laterality Date   ABDOMINAL HYSTERECTOMY     BREAST BIOPSY Right 07/08/2018   CARDIAC CATHETERIZATION  2002   non obstructive disease   colonoscopy with polypectomy  2007 & 2012    hyperplastic ;Dr Watt Climes   COLONOSCOPY WITH PROPOFOL N/A 06/04/2015   Procedure: COLONOSCOPY WITH PROPOFOL;  Surgeon: Jerene Bears, MD;  Location: [facility];  Service: Gastroenterology;  Laterality: N/A;   DEBRIDEMENT LEG Right 03/02/2018   WOUND VAC APPLIED   DEBRIDEMENT LEG Right 05/27/2018   RIGHT LOWER LEG DEBRIDEMENT,  SKIN GRAFT, VAC PLACEMENT    DILATION AND CURETTAGE OF UTERUS      multiple   HEMORRHOID  SURGERY     I & D EXTREMITY Right 03/02/2018   Procedure: RIGHT LEG DEBRIDEMENT AND PLACE VAC;  Surgeon: [name], MD;  Location: [place];  Service: Orthopedics;  Laterality: Right;   I & D EXTREMITY Right 03/04/2018   Procedure: REPEAT IRRIGATION AND DEBRIDEMENT RIGHT LEG, PLACE WOUND VAC;  Surgeon: [name], MD;  Location: [place];  Service: Orthopedics;  Laterality: Right;   I & D EXTREMITY Right 03/30/2018   Procedure: IRRIGATION AND DEBRIDEMENT RIGHT LEG, APPLY WOUND VAC;  Surgeon: [name], MD;  Location: [place];  Service: Orthopedics;  Laterality: Right;   I & D EXTREMITY Right 05/27/2018   Procedure: RIGHT LOWER LEG DEBRIDEMENT,  SKIN GRAFT, VAC PLACEMENT;  Surgeon: [name], MD;  Location: [place];  Service: Orthopedics;  Laterality: Right;  RIGHT LOWER LEG DEBRIDEMENT,  SKIN GRAFT, VAC PLACEMENT   renal calculi  12/1997   SVT with induction of anesthesia   right knee arthroscopy     SKIN SPLIT GRAFT Right 03/04/2018   Procedure: POSSIBLE SPLIT THICKNESS SKIN GRAFT;  Surgeon: [name], MD;  Location: [place];  Service: Orthopedics;  Laterality: Right;   SKIN SPLIT GRAFT Right 04/01/2018   Procedure: REPEAT IRRIGATION AND DEBRIDEMENT RIGHT LEG, APPLY SPLIT THICKNESS SKIN GRAFT;  Surgeon: [name], MD;  Location: [place];  Service: Orthopedics;  Laterality: Right;   TONSILLECTOMY AND ADENOIDECTOMY       Social History:   reports that she has never smoked. She has never used smokeless tobacco. She reports that she does not drink alcohol and does not use drugs.   Family History:  Her family history includes Breast cancer in her cousin, maternal aunt, and maternal aunt; Colon cancer in her father and mother; Diabetes in her maternal aunt, maternal uncle, paternal aunt, and paternal uncle; Heart disease in her paternal aunt; Prostate cancer in her father; Stroke in her paternal aunt.   Allergies Allergies  Allergen Reactions   Penicillins  Rash and Other (See Comments)    She was told not to take it anymore. Has patient had a PCN reaction causing immediate rash, facial/tongue/throat swelling, SOB or lightheadedness with hypotension: Yes Has patient had a PCN reaction causing severe rash involving mucus membranes or skin necrosis: No Has patient had a PCN reaction that required hospitalization: No Has patient had a PCN reaction occurring within the last 10 years: No If all of the above answers are "NO", then may proceed with Cephalosporin use.'   Shellfish Allergy Anaphylaxis   Sulfonamide Derivatives Anaphylaxis and Other (See Comments)   Hydrochlorothiazide W-Triamterene Other (See Comments)    Hypokalemia   Lotensin [Benazepril Hcl] Hives   Dipyridamole Other (See Comments)    Unknown reaction   Estrogens Other (See Comments)    Unknown reaction   Hydrochlorothiazide Other (See Comments)    Unknown reaction   Latex Other (See Comments)    Unknown reaction - stated previously during surgery gloves had to be changed, but unsure if it is still an allergy or not.    Metronidazole Other (See Comments)    Unknown reaction   Other Itching    PICKLES   Spironolactone Other (See Comments)    UNSPECIFIED > "kidney problems"   Sulfa Antibiotics Other (See Comments)    Unknown reaction   Torsemide Other (See Comments)    Unknown reaction   Valsartan Other (See Comments)    Unknown reaction   Mustard [Allyl Isothiocyanate] Itching  Home Medications  Prior to Admission medications   Medication Sig Start Date End Date Taking? Authorizing Provider  acetaminophen (TYLENOL) 325 MG tablet Take 2 tablets (650 mg total) by mouth every 6 (six) hours as needed for moderate pain (or Fever >/= 101). 11/16/18  Yes Arrien, Jimmy Picket, MD  allopurinol (ZYLOPRIM) 100 MG tablet Take 2 tablets (200 mg total) by mouth daily. 06/27/20  Yes Burns, Claudina Lick, MD  apixaban (ELIQUIS) 5 MG TABS tablet Take 5 mg by mouth 2 (two) times daily.    Yes [provider]  Budesonide (PULMICORT FLEXHALER) 90 MCG/ACT inhaler Inhale 1 puff into the lungs 2 (two) times daily.   Yes [provider]  budesonide-formoterol (SYMBICORT) 160-4.5 MCG/ACT inhaler one - two inhalations every 12 hours; gargle and spit after use Patient taking differently: Inhale 1-2 puffs into the lungs every 12 (twelve) hours. 02/19/15  Yes Burns, Claudina Lick, MD  calcium carbonate (TUMS - DOSED IN MG ELEMENTAL CALCIUM) 500 MG chewable tablet Chew 2 tablets by mouth daily as needed for indigestion or heartburn.   Yes [provider]  carvedilol (COREG) 12.5 MG tablet TAKE 1 TABLET(12.5 MG) BY MOUTH TWICE DAILY WITH A MEAL Patient taking differently: Take 12.5 mg by mouth 2 (two) times daily with a meal. 09/16/20  Yes Burns, Claudina Lick, MD  cholecalciferol (VITAMIN D3) 25 MCG (1000 UNIT) tablet Take 1 tablet (1,000 Units total) by mouth daily. 09/16/20  Yes Burns, Claudina Lick, MD  colchicine 0.6 MG tablet Take 2 tabs po at onset of gout flare, then one hour later take 1 tab Patient taking differently: Take 1.2 mg by mouth daily as needed (gout flare). 06/28/20  Yes Burns, Claudina Lick, MD  cyanocobalamin (,VITAMIN B-12,) 1000 MCG/ML injection INJECT 1ML INTO MUSCLE EVERY 30 DAYS Patient taking differently: Inject 1,000 mcg into the skin every 30 (thirty) days. 07/31/20  Yes Burns, Claudina Lick, MD  diclofenac sodium (VOLTAREN) 1 % GEL Apply 2 g topically 4 (four) times daily. Patient taking differently: Apply 2 g topically 4 (four) times daily as needed (dry skin, rash). 03/20/16  Yes Marcial Pacas, MD  eszopiclone (LUNESTA) 1 MG TABS tablet Take 1 tablet (1 mg total) by mouth at bedtime as needed for sleep. Take immediately before bedtime Patient taking differently: Take 1 mg by mouth at bedtime as needed for sleep. 10/02/20 11/15/20 Yes Olalere, Adewale A, MD  famotidine (PEPCID) 20 MG tablet Take 1 tablet (20 mg total) by mouth 2 (two) times daily. 11/11/20  Yes Burns, Claudina Lick,  MD  FEROSUL 325 (65 Fe) MG tablet TAKE 1 TABLET BY MOUTH EVERY MONDAY, [address], AND FRIDAY Patient taking differently: Take 325 mg by mouth every Monday, Wednesday, and Friday. 10/17/20  Yes Burns, Claudina Lick, MD  folic acid (FOLVITE) 948 MCG tablet Take 1 tablet (400 mcg total) by mouth at bedtime. Take 400 mcg by mouth at bedtime. Patient taking differently: Take 400 mcg by mouth at bedtime. 03/12/20  Yes Burns, Claudina Lick, MD  gabapentin (NEURONTIN) 100 MG capsule TAKE 1 CAPSULE(100 MG) BY MOUTH THREE TIMES DAILY Patient taking differently: Take 100 mg by mouth 3 (three) times daily. 09/16/20  Yes Burns, Claudina Lick, MD  hydrOXYzine (ATARAX/VISTARIL) 25 MG tablet Take 1 tablet (25 mg total) by mouth 3 (three) times daily as needed for itching. 03/01/19  Yes Fair, Marin Shutter, MD  montelukast (SINGULAIR) 10 MG tablet Take 1 tablet (10 mg total) by mouth at bedtime. 03/12/20  Yes Billey Gosling  J, MD  Nutritional Supplements (,FEEDING SUPPLEMENT, PROSOURCE PLUS) liquid Take 30 mLs by mouth 2 (two) times daily between meals. 05/15/20  Yes Antonieta Pert, MD  Oxcarbazepine (TRILEPTAL) 300 MG tablet Take 1 tablet (300 mg total) by mouth 2 (two) times daily. 06/03/20  Yes Burns, Claudina Lick, MD  oxyCODONE (OXY IR/ROXICODONE) 5 MG immediate release tablet Take 1 tablet (5 mg total) by mouth every 6 (six) hours as needed for severe pain (chronic knee and back pain). 10/28/20  Yes Burns, Claudina Lick, MD  polyethylene glycol (MIRALAX / GLYCOLAX) 17 g packet Take 17 g by mouth daily. Patient taking differently: Take 17 g by mouth daily as needed for mild constipation. 03/02/19  Yes Fair, Marin Shutter, MD  potassium chloride SA (KLOR-CON) 20 MEQ tablet Take 20 mEq by mouth 3 (three) times daily. 11/09/20  Yes [provider]  senna-docusate (SENOKOT-S) 8.6-50 MG tablet Take 1 tablet by mouth at bedtime as needed for mild constipation. 03/01/19  Yes Fair, Marin Shutter, MD  simvastatin (ZOCOR) 40 MG tablet Take 1 tablet (40 mg total) by mouth at  bedtime. 05/06/20  Yes Burns, Claudina Lick, MD  spironolactone (ALDACTONE) 25 MG tablet TAKE 1 TABLET(25 MG) BY MOUTH DAILY Patient taking differently: Take 25 mg by mouth daily. 10/28/20  Yes Burns, Claudina Lick, MD  torsemide (DEMADEX) 20 MG tablet Take 3 tablets (60 mg total) by mouth 2 (two) times daily. May take an additional tablet as directed by CHF clinic. 09/03/20 12/02/20 Yes [name], Maricela Bo, FNP  VENTOLIN HFA 108 (90 Base) MCG/ACT inhaler Inhale 2 puffs into the lungs every 4 (four) hours as needed for wheezing. 11/13/19  Yes [provider]  Alcohol Swabs (B-D SINGLE USE SWABS REGULAR) PADS UAD   E11.9 10/14/20   [name], MD  Blood Glucose Calibration (TRUE METRIX LEVEL 1) Low SOLN UAD  E11.9 10/14/20   [name], MD     Critical care time: 25 min    Richardson Landry Monserath Neff ACNP Acute Care Nurse Practitioner [place] Please consult [place] 11/18/2020, 12:32 PM

## 2020-11-18 NOTE — Progress Notes (Signed)
PROGRESS NOTE  April [name] AJG:[id number] DOB: Jun 19, 1950   PCP: [name], MD  Patient is from: Home.  Lives with husband.  Uses walker at baseline.  DOA: 11/15/2020 LOS: 2  Chief complaints:  Chief Complaint  Patient presents with   Shortness of Breath     Brief Narrative / Interim history: 70 year old F with PMH of diastolic CHF, DM-2, CKD 3, morbid obesity, asthma, OSA not on CPAP, DVT on Eliquis, CAD and ambulatory dysfunction presenting from advanced heart failure clinic with hypoxia to 77%, generalized weakness and near syncope, and admitted for acute on chronic hypoxic and hypercapnic respiratory failure and CHF exacerbation requiring BiPAP.   Cardiology following.  PCCM consulted as well.  Subjective: Seen and examined earlier this morning.  No major events overnight or this morning.  She says she could not sleep.  She reports nasal congestion from oxygen.  She did not wear BiPAP last night.  She says she could not tolerate the facemask although not sure if BiPAP was tried last night.  She denies chest pain.  She says her breathing is fine lying down with supplemental oxygen.  Objective: Vitals:   11/18/20 0449 11/18/20 0734 11/18/20 0741 11/18/20 1046  BP:  122/73  130/66  Pulse:  82  83  Resp:  20  15  Temp:    98.8 F (37.1 C)  TempSrc:      SpO2:   95% 90%  Weight: (!) 165.9 kg     Height:        Intake/Output Summary (Last 24 hours) at 11/18/2020 1102 Last data filed at 11/18/2020 0737 Gross per 24 hour  Intake 1077 ml  Output 1100 ml  Net -23 ml   Filed Weights   11/15/20 1247 11/17/20 0532 11/18/20 0449  Weight: (!) 163.3 kg (!) 166.8 kg (!) 165.9 kg    Examination:  GENERAL: No apparent distress.  Nontoxic. HEENT: MMM.  Vision and hearing grossly intact.  NECK: Supple.  Difficult to assess JVD due to body habitus. RESP: 96% on 20 L / 40% FiO2.  No IWOB.  Fair aeration but limited exam due to body habitus. CVS:  RRR. Heart sounds  normal.  ABD/GI/GU: BS+. Abd soft, NTND.  MSK/EXT:  Moves extremities. No apparent deformity.  Trace edema in BLE. SKIN: no apparent skin lesion or wound NEURO: Awake and alert. Oriented appropriately.  No apparent focal neuro deficit. PSYCH: Calm. Normal affect.   Procedures:  None  Microbiology summarized: [id number] and influenza PCR nonreactive.  Assessment & Plan: Acute on chronic respiratory failure with hypoxia and hypercapnia-ABG 7.29/80/69/41 suggesting some acute on chronic respiratory acidosis.  Likely driven by obstructive process/OSA/possible OHS.   Doubt PE.  She is on Eliquis for DVT although efficacy not well studied in patient population like her.  On 20 L with 40% FiO2 by HFNC but saturating at 96% -Minimal oxygen to keep saturation above 88%.  Discussed with patient and patient's RN -Cardiology managing CHF-on IV Lasix 80 mg twice daily -GDMT-Aldactone and diastolic -Needs BiPAP when asleep and at night! -Pulmonology consulted -Avoid or minimize sedating medications -Agree with changing Coreg to Bystolic given history of asthma  Acute on chronic diastolic CHF: TTE in 07/3843 with LVEF of 60 to 65%, G2 DD, and RVSP of 66 mmHg.  Difficult to assess fluid status due to morbid obesity with BMI of 62.  Seems to be improving with diuretics.  About 1.6 L UOP/24 hours.  Net -3.6 L.  Renal function  improved -Cardiology managing -BiPAP as above -Follow echocardiogram. -Monitor fluid status, renal functions and electrolytes -Sodium and fluid restriction  History of asthma-doubt this is playing a role -Continue home inhalers with as needed DuoNebs  AKI on CKD-3A: Improving. Recent Labs    05/15/20 0428 06/11/20 0955 08/14/20 1150 08/30/20 1130 09/10/20 1553 10/02/20 1039 11/15/20 1247 11/16/20 0344 11/17/20 0121 11/18/20 0053  BUN 38* 21 19 19 20 22 21 17 21 21   CREATININE 1.50* 1.46* 1.54* 1.41* 1.26* 1.40* 1.65* 1.38* 1.40* 1.39*  -Continue monitoring  CAD:  Patient denies chest pain.  EKG and troponin reassuring. -Continue home cardiac meds  History of BLE DVT-low suspicion for PE -Continue home Eliquis  Normocytic anemia: Slight drop from admission but seems to be at baseline. Recent Labs    05/10/20 0844 05/11/20 0347 05/12/20 0541 05/13/20 0334 05/15/20 0428 10/02/20 1039 11/15/20 1247 11/15/20 1900 11/17/20 0121 11/18/20 0053  HGB 11.0* 11.5* 10.9* 10.7* 10.4* 12.7 12.3 13.6 10.9* 10.2*  -Monitor H&H -Check anemia panel in the morning   Mild thrombocytopenia: -Continue monitoring  Physical deconditioning/debility-complicated by morbid obesity, osteoarthritis and respiratory failure -PT/OT eval  Morbid obesity-this complicates patient's ability to ambulate Body mass index is 62.78 kg/m. -Encourage lifestyle change to lose weight -Bariatric surgery?         DVT prophylaxis:   apixaban (ELIQUIS) tablet 5 mg  Code Status: Full code Family Communication: Patient and/or RN. Available if any question.  Level of care: Progressive Status is: Inpatient  Remains inpatient appropriate because: due to significantly increased oxygen requirement and need for IV diuretics     Consultants:  Cardiology Pulmonology   Sch Meds:  Scheduled Meds:  (feeding supplement) PROSource Plus  30 mL Oral BID BM   acetaZOLAMIDE  250 mg Oral BID   allopurinol  200 mg Oral Daily   apixaban  5 mg Oral BID   atorvastatin  40 mg Oral QHS   cholecalciferol  1,000 Units Oral Daily   famotidine  20 mg Oral BID   ferrous sulfate  325 mg Oral Q M,W,F   folic acid  3,762 mcg Oral QHS   furosemide  80 mg Intravenous BID   gabapentin  100 mg Oral TID   Gerhardt's butt cream   Topical TID   mometasone-formoterol  2 puff Inhalation BID   montelukast  10 mg Oral QHS   nebivolol  5 mg Oral Daily   Oxcarbazepine  300 mg Oral BID   potassium chloride SA  20 mEq Oral BID   spironolactone  25 mg Oral Daily   Continuous Infusions: PRN  Meds:.acetaminophen, albuterol, diclofenac Sodium, hydrOXYzine, ondansetron **OR** ondansetron (ZOFRAN) IV, polyethylene glycol, senna-docusate, sodium chloride  Antimicrobials: Anti-infectives (From admission, onward)    None        I have personally reviewed the following labs and images: CBC: Recent Labs  Lab 11/15/20 1247 11/15/20 1900 11/17/20 0121 11/18/20 0053  WBC 6.0  --  5.9 5.6  NEUTROABS 4.6  --   --   --   HGB 12.3 13.6 10.9* 10.2*  HCT 41.3 40.0 37.5 35.1*  MCV 103.0*  --  105.0* 104.8*  PLT 166  --  143* 142*   BMP &GFR Recent Labs  Lab 11/15/20 1247 11/15/20 1900 11/16/20 0344 11/17/20 0121 11/18/20 0053  NA 139 141 140 136 138  K 4.6 4.2 4.3 4.7 4.0  CL 98  --  97* 96* 97*  CO2 33*  --  35* 31 34*  GLUCOSE 130*  --  105* 119* 128*  BUN 21  --  17 21 21   CREATININE 1.65*  --  1.38* 1.40* 1.39*  CALCIUM 9.0  --  8.6* 8.2* 8.3*  MG  --   --   --  2.2 2.3  PHOS  --   --   --  4.5 3.6   Estimated Creatinine Clearance: 59 mL/min (A) (by C-G formula based on SCr of 1.39 mg/dL (H)). Liver & Pancreas: Recent Labs  Lab 11/17/20 0121 11/18/20 0053  ALBUMIN 2.8* 2.7*   No results for input(s): LIPASE, AMYLASE in the last 168 hours. Recent Labs  Lab 11/15/20 1543  AMMONIA <10   Diabetic: No results for input(s): HGBA1C in the last 72 hours. Recent Labs  Lab 11/17/20 0653 11/17/20 1127 11/17/20 1524 11/17/20 2207 11/18/20 0643  GLUCAP 125* 176* 88 102* 122*   Cardiac Enzymes: No results for input(s): CKTOTAL, CKMB, CKMBINDEX, TROPONINI in the last 168 hours. No results for input(s): PROBNP in the last 8760 hours. Coagulation Profile: No results for input(s): INR, PROTIME in the last 168 hours. Thyroid Function Tests: Recent Labs    11/15/20 1543  TSH 4.317   Lipid Profile: No results for input(s): CHOL, HDL, LDLCALC, TRIG, CHOLHDL, LDLDIRECT in the last 72 hours. Anemia Panel: No results for input(s): VITAMINB12, FOLATE, FERRITIN,  TIBC, IRON, RETICCTPCT in the last 72 hours. Urine analysis:    Component Value Date/Time   COLORURINE STRAW (A) 02/15/2019 1856   APPEARANCEUR CLEAR 02/15/2019 1856   LABSPEC 1.006 02/15/2019 1856   PHURINE 8.0 02/15/2019 1856   GLUCOSEU NEGATIVE 02/15/2019 1856   GLUCOSEU NEGATIVE 02/19/2015 1707   HGBUR NEGATIVE 02/15/2019 1856   BILIRUBINUR NEGATIVE 02/15/2019 1856   BILIRUBINUR negative 09/05/2013 0832   KETONESUR NEGATIVE 02/15/2019 1856   PROTEINUR NEGATIVE 02/15/2019 1856   UROBILINOGEN 2.0 (A) 02/19/2015 1707   NITRITE NEGATIVE 02/15/2019 1856   LEUKOCYTESUR NEGATIVE 02/15/2019 1856   Sepsis Labs: Invalid input(s): PROCALCITONIN, [place]  Microbiology: Recent Results (from the past 240 hour(s))  Resp Panel by RT-PCR (Flu A&B, Covid) Nasopharyngeal Swab     Status: None   Collection Time: 11/15/20 12:50 PM   Specimen: Nasopharyngeal Swab; Nasopharyngeal(NP) swabs in vial transport medium  Result Value Ref Range Status   SARS Coronavirus 2 by RT PCR NEGATIVE NEGATIVE Final    Comment: (NOTE) SARS-CoV-2 target nucleic acids are NOT DETECTED.  The SARS-CoV-2 RNA is generally detectable in upper respiratory specimens during the acute phase of infection. The lowest concentration of SARS-CoV-2 viral copies this assay can detect is 138 copies/mL. A negative result does not preclude SARS-Cov-2 infection and should not be used as the sole basis for treatment or other patient management decisions. A negative result may occur with  improper specimen collection/handling, submission of specimen other than nasopharyngeal swab, presence of viral mutation(s) within the areas targeted by this assay, and inadequate number of viral copies(<138 copies/mL). A negative result must be combined with clinical observations, patient history, and epidemiological information. The expected result is Negative.  Fact Sheet for Patients:  EntrepreneurPulse.com.au  Fact  Sheet for Healthcare Providers:  IncredibleEmployment.be  This test is no t yet approved or cleared by the [place] FDA and  has been authorized for detection and/or diagnosis of SARS-CoV-2 by FDA under an Emergency Use Authorization (EUA). This EUA will remain  in effect (meaning this test can be used) for the duration of the COVID-19 declaration under Section 564(b)(1) of the Act, 21  U.S.C.section 360bbb-3(b)(1), unless the authorization is terminated  or revoked sooner.       Influenza A by PCR NEGATIVE NEGATIVE Final   Influenza B by PCR NEGATIVE NEGATIVE Final    Comment: (NOTE) The Xpert Xpress SARS-CoV-2/FLU/RSV plus assay is intended as an aid in the diagnosis of influenza from Nasopharyngeal swab specimens and should not be used as a sole basis for treatment. Nasal washings and aspirates are unacceptable for Xpert Xpress SARS-CoV-2/FLU/RSV testing.  Fact Sheet for Patients: EntrepreneurPulse.com.au  Fact Sheet for Healthcare Providers: IncredibleEmployment.be  This test is not yet approved or cleared by the [place] FDA and has been authorized for detection and/or diagnosis of SARS-CoV-2 by FDA under an Emergency Use Authorization (EUA). This EUA will remain in effect (meaning this test can be used) for the duration of the COVID-19 declaration under Section 564(b)(1) of the Act, 21 U.S.C. section 360bbb-3(b)(1), unless the authorization is terminated or revoked.  Performed at [facility], [place] [address] [place], [place] [address]     Radiology Studies: No results found.     Sherrill Buikema T. [place]  If 7PM-7AM, please contact night-coverage [email or web address] 11/18/2020, 11:02 AM

## 2020-11-18 NOTE — Progress Notes (Signed)
CSW attempted to visit the patient at bedside to introduce self as the heart failure social worker and to complete a very brief SDOH screening with the patient to address social needs as needed however the patient was sleeping and CSW was unsuccessful at waking patient up.  CSW will continue to follow for discharge needs and will check back with the patient at another time.  Kali Deadwyler, MSW, [place] [facility] Heart Failure Social Worker

## 2020-11-18 NOTE — Progress Notes (Addendum)
Advanced Heart Failure Rounding Note  PCP-Cardiologist: Dr. Haroldine Laws   Subjective:    Still feels SOB. On HFNC, 20L/min . Notes wheezing and cough w/ frothy sputum.  I/os not accurate.  Doubt wts accurate.   SCr down w/ diuresis, 1.65 on admit>>1.39 today.   Still awaiting echo.    Objective:   Weight Range: (!) 165.9 kg Body mass index is 62.78 kg/m.   Vital Signs:   Temp:  [98.4 F (36.9 C)-99.3 F (37.4 C)] 98.5 F (36.9 C) (10/17 0234) Pulse Rate:  [72-82] 82 (10/17 0734) Resp:  [16-20] 20 (10/17 0734) BP: (106-127)/(56-90) 122/73 (10/17 0734) SpO2:  [89 %-95 %] 95 % (10/17 0741) FiO2 (%):  [30 %-40 %] 40 % (10/17 0741) Weight:  [165.9 kg] 165.9 kg (10/17 0449) Last BM Date: 11/17/20  Weight change: Filed Weights   11/15/20 1247 11/17/20 0532 11/18/20 0449  Weight: (!) 163.3 kg (!) 166.8 kg (!) 165.9 kg    Intake/Output:   Intake/Output Summary (Last 24 hours) at 11/18/2020 0803 Last data filed at 11/18/2020 0737 Gross per 24 hour  Intake 1437 ml  Output 1900 ml  Net -463 ml      Physical Exam    General:  super morbidly obese AAF on HFNC. No resp difficulty HEENT: Normal Neck: Supple. JVP to jaw. Carotids 2+ bilat; no bruits. No lymphadenopathy or thyromegaly appreciated. Cor: PMI nondisplaced. Regular rate & rhythm. No rubs, gallops or murmurs. Lungs: decreased BS at the bases  Abdomen: obese soft, nontender, nondistended. No hepatosplenomegaly. No bruits or masses. Good bowel sounds. Extremities: No cyanosis, clubbing, rash, obese LEs  Neuro: Alert & orientedx3, cranial nerves grossly intact. moves all 4 extremities w/o difficulty. Affect pleasant   Telemetry   NSR 80s, personally reviewed   EKG    No new EKG to review   Labs    CBC Recent Labs    11/15/20 1247 11/15/20 1900 11/17/20 0121 11/18/20 0053  WBC 6.0  --  5.9 5.6  NEUTROABS 4.6  --   --   --   HGB 12.3   < > 10.9* 10.2*  HCT 41.3   < > 37.5 35.1*  MCV 103.0*   --  105.0* 104.8*  PLT 166  --  143* 142*   < > = values in this interval not displayed.   Basic Metabolic Panel Recent Labs    11/17/20 0121 11/18/20 0053  NA 136 138  K 4.7 4.0  CL 96* 97*  CO2 31 34*  GLUCOSE 119* 128*  BUN 21 21  CREATININE 1.40* 1.39*  CALCIUM 8.2* 8.3*  MG 2.2 2.3  PHOS 4.5 3.6   Liver Function Tests Recent Labs    11/17/20 0121 11/18/20 0053  ALBUMIN 2.8* 2.7*   No results for input(s): LIPASE, AMYLASE in the last 72 hours. Cardiac Enzymes No results for input(s): CKTOTAL, CKMB, CKMBINDEX, TROPONINI in the last 72 hours.  BNP: BNP (last 3 results) Recent Labs    08/14/20 1150 11/15/20 1247 11/18/20 0053  BNP 53.1 129.4* 110.8*    ProBNP (last 3 results) No results for input(s): PROBNP in the last 8760 hours.   D-Dimer No results for input(s): DDIMER in the last 72 hours. Hemoglobin A1C No results for input(s): HGBA1C in the last 72 hours. Fasting Lipid Panel No results for input(s): CHOL, HDL, LDLCALC, TRIG, CHOLHDL, LDLDIRECT in the last 72 hours. Thyroid Function Tests Recent Labs    11/15/20 1543  TSH 4.317  Other results:   Imaging    No results found.   Medications:     Scheduled Medications:  (feeding supplement) PROSource Plus  30 mL Oral BID BM   allopurinol  200 mg Oral Daily   apixaban  5 mg Oral BID   carvedilol  12.5 mg Oral BID WC   cholecalciferol  1,000 Units Oral Daily   famotidine  20 mg Oral BID   ferrous sulfate  325 mg Oral Q M,W,F   folic acid  6,644 mcg Oral QHS   furosemide  80 mg Intravenous BID   gabapentin  100 mg Oral TID   Gerhardt's butt cream   Topical TID   mometasone-formoterol  2 puff Inhalation BID   montelukast  10 mg Oral QHS   Oxcarbazepine  300 mg Oral BID   potassium chloride SA  20 mEq Oral BID   simvastatin  40 mg Oral QHS   spironolactone  25 mg Oral Daily    Infusions:   PRN Medications: acetaminophen, albuterol, diclofenac Sodium, hydrOXYzine, ondansetron  **OR** ondansetron (ZOFRAN) IV, polyethylene glycol, senna-docusate, sodium chloride    Assessment/Plan   1. Acute on Chronic Hypoxic Respiratory Failure - ABG 7.29/80/69/41 suggesting some acute on chronic respiratory acidosis - Suspect driven by OSA/ OHS  - Needs BiPAP qhs - Sleep study has been scheduled by Pulmonary. - Needs wt loss but mobility limited by severe knee/hip OA.  - Suspect current symptoms respiratory-driven. - Given asthma + wheezing will change from Coreg to Bystolic, 5 mg daily    2. Acute on Chronic Diastolic Heart Failure  - RHC 2008 c/w mild pulmonary hypertension, primarily due to high output state.  Her mean pulmonary artery pressure was 27, wedge pressure was 10. PVR was 1.80 [place] units. - Echo (2017): LVEF 65-70%, G1DD, RV normal - Recent admit for a/c dCHF. Volume improved w/ IV Lasix.  - Echo (4/22): LVEF 60-65%, G2DD  - NYHA III, functional status difficult due to body habitus, asthma/chronic resp failure, and physical deconditioning. Exam difficult for volume given obesity but suspect she may still be fluid overloaded  - Will place PICC line for CVP monitoring to help guide diuresis  - Continue IV lasix 80 mg bid  - Continue spiro 25 mg daily.  - Switch carvedilol to Bystolic 5 mg daily  - No SGLT2i given risk of GU infection given morbid obesity/ large panus. - Get echo today to assess RV    3. Bilateral LE DVT (Diagnosed 4/22) - Confirmed by venous dopplers. Chest CT negative for PE. - Now on Eliquis. No bleeding issues.   4. Asthma  - per primary  - On Symbicort.    5. CAD:  - LHC in 2008 showed 50-60% LAD lesion and 30% RCA lesion. - No s/s of angina.  - Continue ? blocker and statin. - No ASA given Eliquis for DVT tx.    6. Stage IIIb CKD - Baseline SCr ~1.5. - Followed by Dr. [name] at [facility]. - 1.7>>1.40 - follow w/ diuresis    Length of Stay: 2  Lyda Jester, PA-C  11/18/2020, 8:03 AM  Advanced Heart Failure Team Pager  [phone] (M-F; 7a - 5p)  Please contact [facility] for night-coverage after hours (5p -7a ) and weekends on [email or web address]   Patient seen and examined with the above-signed Advanced Practice Provider and/or Housestaff. I personally reviewed laboratory data, imaging studies and relevant notes. I independently examined the patient and formulated the important aspects of the plan. I  have edited the note to reflect any of my changes or salient points. I have personally discussed the plan with the patient and/or family.  Remains SOB + orthopnea. On high flow O2. Only modest diuresis.  SCr stable.   General:  Markedly obese woman sitting up in bed  HEENT: normal Neck: supple. Unable to see JVP Carotids 2+ bilat; no bruits. No lymphadenopathy or thryomegaly appreciated. Cor: PMI nonpalpable . Regular rate & rhythm. No rubs, gallops or murmurs. Lungs: clear Abdomen: obese soft, nontender, nondistended. No hepatosplenomegaly. No bruits or masses. Good bowel sounds. Extremities: no cyanosis, clubbing, rash, 1+ edema Neuro: alert & orientedx3, cranial nerves grossly intact. moves all 4 extremities w/o difficulty. Affect pleasant  Volume status very hard to assess. Suspect still with volume overload. Continue IV lasix. Add diamox 250 po bid. Await echo. Will likely need RHC.   That said suspect OSA/OHS the major issue here. Consider Pulmonary involvement.   Glori Bickers, MD  9:13 AM

## 2020-11-18 NOTE — Progress Notes (Signed)
Pt stated she doesn't wear CPAP at home. Pt also stated that she would rather wear the [place] for the night instead of Bipap. Pt confortably at this time on HFNC 15L 40%.

## 2020-11-18 NOTE — Progress Notes (Signed)
Mobility Specialist Progress Note:   11/18/20 1659  Mobility  Activity Refused mobility   Pt refused d/t eating dinner and "already had therapy today".   [facility] Health and safety inspector Phone [phone]

## 2020-11-19 ENCOUNTER — Encounter (HOSPITAL_COMMUNITY)
Admission: EM | Disposition: A | Discharge status: Home Health | Payer: Self-pay | Source: Home / Self Care | Attending: Student

## 2020-11-19 ENCOUNTER — Other Ambulatory Visit (HOSPITAL_COMMUNITY): Payer: Self-pay

## 2020-11-19 DIAGNOSIS — G4733 Obstructive sleep apnea (adult) (pediatric): Secondary | ICD-10-CM

## 2020-11-19 DIAGNOSIS — J9622 Acute and chronic respiratory failure with hypercapnia: Secondary | ICD-10-CM | POA: Diagnosis not present

## 2020-11-19 DIAGNOSIS — N1832 Chronic kidney disease, stage 3b: Secondary | ICD-10-CM | POA: Diagnosis not present

## 2020-11-19 DIAGNOSIS — D649 Anemia, unspecified: Secondary | ICD-10-CM

## 2020-11-19 DIAGNOSIS — I2510 Atherosclerotic heart disease of native coronary artery without angina pectoris: Secondary | ICD-10-CM

## 2020-11-19 DIAGNOSIS — N179 Acute kidney failure, unspecified: Secondary | ICD-10-CM

## 2020-11-19 DIAGNOSIS — I5033 Acute on chronic diastolic (congestive) heart failure: Secondary | ICD-10-CM | POA: Diagnosis not present

## 2020-11-19 DIAGNOSIS — Z86718 Personal history of other venous thrombosis and embolism: Secondary | ICD-10-CM

## 2020-11-19 DIAGNOSIS — E662 Morbid (severe) obesity with alveolar hypoventilation: Secondary | ICD-10-CM

## 2020-11-19 DIAGNOSIS — I10 Essential (primary) hypertension: Secondary | ICD-10-CM

## 2020-11-19 DIAGNOSIS — Z6841 Body Mass Index (BMI) 40.0 and over, adult: Secondary | ICD-10-CM

## 2020-11-19 DIAGNOSIS — J9621 Acute and chronic respiratory failure with hypoxia: Secondary | ICD-10-CM | POA: Diagnosis not present

## 2020-11-19 HISTORY — PX: RIGHT HEART CATH: CATH118263

## 2020-11-19 LAB — POCT I-STAT EG7
Acid-Base Excess: 9 mmol/L — ABNORMAL HIGH (ref 0.0–2.0)
Acid-Base Excess: 9 mmol/L — ABNORMAL HIGH (ref 0.0–2.0)
Bicarbonate: 38.7 mmol/L — ABNORMAL HIGH (ref 20.0–28.0)
Bicarbonate: 39.5 mmol/L — ABNORMAL HIGH (ref 20.0–28.0)
Calcium, Ion: 1.07 mmol/L — ABNORMAL LOW (ref 1.15–1.40)
Calcium, Ion: 1.12 mmol/L — ABNORMAL LOW (ref 1.15–1.40)
HCT: 38 % (ref 36.0–46.0)
HCT: 38 % (ref 36.0–46.0)
Hemoglobin: 12.9 g/dL (ref 12.0–15.0)
Hemoglobin: 12.9 g/dL (ref 12.0–15.0)
O2 Saturation: 61 %
O2 Saturation: 69 %
Potassium: 3.5 mmol/L (ref 3.5–5.1)
Potassium: 3.6 mmol/L (ref 3.5–5.1)
Sodium: 142 mmol/L (ref 135–145)
Sodium: 143 mmol/L (ref 135–145)
TCO2: 41 mmol/L — ABNORMAL HIGH (ref 22–32)
TCO2: 42 mmol/L — ABNORMAL HIGH (ref 22–32)
pCO2, Ven: 81.4 mmHg (ref 44.0–60.0)
pCO2, Ven: 85.3 mmHg (ref 44.0–60.0)
pH, Ven: 7.274 (ref 7.250–7.430)
pH, Ven: 7.285 (ref 7.250–7.430)
pO2, Ven: 38 mmHg (ref 32.0–45.0)
pO2, Ven: 43 mmHg (ref 32.0–45.0)

## 2020-11-19 LAB — RENAL FUNCTION PANEL
Albumin: 2.7 g/dL — ABNORMAL LOW (ref 3.5–5.0)
Anion gap: 7 (ref 5–15)
BUN: 20 mg/dL (ref 8–23)
CO2: 33 mmol/L — ABNORMAL HIGH (ref 22–32)
Calcium: 8.1 mg/dL — ABNORMAL LOW (ref 8.9–10.3)
Chloride: 95 mmol/L — ABNORMAL LOW (ref 98–111)
Creatinine, Ser: 1.35 mg/dL — ABNORMAL HIGH (ref 0.44–1.00)
GFR, Estimated: 42 mL/min — ABNORMAL LOW (ref >60)
Glucose, Bld: 145 mg/dL — ABNORMAL HIGH (ref 70–99)
Phosphorus: 3.9 mg/dL (ref 2.5–4.6)
Potassium: 3.7 mmol/L (ref 3.5–5.1)
Sodium: 135 mmol/L (ref 135–145)

## 2020-11-19 LAB — RETICULOCYTES
Immature Retic Fract: 26.3 % — ABNORMAL HIGH (ref 2.3–15.9)
RBC.: 3.41 MIL/uL — ABNORMAL LOW (ref 3.87–5.11)
Retic Count, Absolute: 88.3 10*3/uL (ref 19.0–186.0)
Retic Ct Pct: 2.6 % (ref 0.4–3.1)

## 2020-11-19 LAB — VITAMIN B12: Vitamin B-12: 1681 pg/mL — ABNORMAL HIGH (ref 180–914)

## 2020-11-19 LAB — CBC
HCT: 35.1 % — ABNORMAL LOW (ref 36.0–46.0)
Hemoglobin: 10.4 g/dL — ABNORMAL LOW (ref 12.0–15.0)
MCH: 31 pg (ref 26.0–34.0)
MCHC: 29.6 g/dL — ABNORMAL LOW (ref 30.0–36.0)
MCV: 104.8 fL — ABNORMAL HIGH (ref 80.0–100.0)
Platelets: 146 10*3/uL — ABNORMAL LOW (ref 150–400)
RBC: 3.35 MIL/uL — ABNORMAL LOW (ref 3.87–5.11)
RDW: 16.4 % — ABNORMAL HIGH (ref 11.5–15.5)
WBC: 5.4 10*3/uL (ref 4.0–10.5)
nRBC: 0.4 % — ABNORMAL HIGH (ref 0.0–0.2)

## 2020-11-19 LAB — MAGNESIUM: Magnesium: 2.3 mg/dL (ref 1.7–2.4)

## 2020-11-19 LAB — IRON AND TIBC
Iron: 51 ug/dL (ref 28–170)
Saturation Ratios: 21 % (ref 10.4–31.8)
TIBC: 248 ug/dL — ABNORMAL LOW (ref 250–450)
UIBC: 197 ug/dL

## 2020-11-19 LAB — GLUCOSE, CAPILLARY
Glucose-Capillary: 130 mg/dL — ABNORMAL HIGH (ref 70–99)
Glucose-Capillary: 142 mg/dL — ABNORMAL HIGH (ref 70–99)
Glucose-Capillary: 95 mg/dL (ref 70–99)
Glucose-Capillary: 112 mg/dL — ABNORMAL HIGH (ref 70–99)

## 2020-11-19 LAB — FERRITIN: Ferritin: 79 ng/mL (ref 11–307)

## 2020-11-19 LAB — FOLATE: Folate: 38.7 ng/mL (ref >5.9)

## 2020-11-19 SURGERY — RIGHT HEART CATH
Anesthesia: LOCAL

## 2020-11-19 MED ORDER — HEPARIN (PORCINE) IN NACL 1000-0.9 UT/500ML-% IV SOLN
INTRAVENOUS | Status: AC
  Filled 2020-11-19: qty 500

## 2020-11-19 MED ORDER — POTASSIUM CHLORIDE CRYS ER 20 MEQ PO TBCR: 20.0000 meq | EXTENDED_RELEASE_TABLET | Freq: Once | ORAL | Status: DC

## 2020-11-19 MED ORDER — SODIUM CHLORIDE 0.9% FLUSH
3.0000 mL | Freq: Two times a day (BID) | INTRAVENOUS | Status: DC
Start: 2020-11-19 — End: 2020-11-22
  Administered 2020-11-21: 3 mL via INTRAVENOUS

## 2020-11-19 MED ORDER — SODIUM CHLORIDE 0.9 % IV SOLN: INTRAVENOUS | Status: DC

## 2020-11-19 MED ORDER — METOLAZONE 2.5 MG PO TABS
2.5000 mg | ORAL_TABLET | Freq: Once | ORAL | Status: AC
  Administered 2020-11-19: 2.5 mg via ORAL
  Filled 2020-11-19: qty 1

## 2020-11-19 MED ORDER — HYDRALAZINE HCL 20 MG/ML IJ SOLN
10.0000 mg | INTRAMUSCULAR | Status: AC | PRN
Start: 2020-11-19 — End: 2020-11-19

## 2020-11-19 MED ORDER — ONDANSETRON HCL 4 MG/2ML IJ SOLN: 4.0000 mg | Freq: Four times a day (QID) | INTRAMUSCULAR | Status: DC | PRN

## 2020-11-19 MED ORDER — SODIUM CHLORIDE 0.9% FLUSH
3.0000 mL | Freq: Two times a day (BID) | INTRAVENOUS | Status: DC
  Administered 2020-11-19 – 2020-11-20 (×3): 3 mL via INTRAVENOUS

## 2020-11-19 MED ORDER — LABETALOL HCL 5 MG/ML IV SOLN: 10.0000 mg | INTRAVENOUS | Status: AC | PRN

## 2020-11-19 MED ORDER — HEPARIN (PORCINE) IN NACL 1000-0.9 UT/500ML-% IV SOLN
INTRAVENOUS | Status: DC | PRN
  Administered 2020-11-19: 500 mL

## 2020-11-19 MED ORDER — ASPIRIN 81 MG PO CHEW
81.0000 mg | CHEWABLE_TABLET | ORAL | Status: DC
Start: 2020-11-20 — End: 2020-11-19

## 2020-11-19 MED ORDER — ACETAMINOPHEN 325 MG PO TABS
650.0000 mg | ORAL_TABLET | ORAL | Status: DC | PRN
  Filled 2020-11-19: qty 2

## 2020-11-19 MED ORDER — LIDOCAINE HCL (PF) 1 % IJ SOLN
INTRAMUSCULAR | Status: DC | PRN
  Administered 2020-11-19: 2 mL

## 2020-11-19 MED ORDER — SODIUM CHLORIDE 0.9% FLUSH: 3.0000 mL | INTRAVENOUS | Status: DC | PRN

## 2020-11-19 MED ORDER — SODIUM CHLORIDE 0.9 % IV SOLN
250.0000 mL | INTRAVENOUS | Status: DC | PRN
Start: 2020-11-19 — End: 2020-11-19

## 2020-11-19 MED ORDER — SODIUM CHLORIDE 0.9 % IV SOLN: 250.0000 mL | INTRAVENOUS | Status: DC | PRN

## 2020-11-19 MED ORDER — ENOXAPARIN SODIUM 40 MG/0.4ML IJ SOSY: 40.0000 mg | PREFILLED_SYRINGE | INTRAMUSCULAR | Status: DC

## 2020-11-19 MED ORDER — LIDOCAINE HCL (PF) 1 % IJ SOLN
INTRAMUSCULAR | Status: AC
  Filled 2020-11-19: qty 30

## 2020-11-19 MED ORDER — ASPIRIN 81 MG PO CHEW
81.0000 mg | CHEWABLE_TABLET | ORAL | Status: AC
  Administered 2020-11-19: 81 mg via ORAL
  Filled 2020-11-19: qty 1

## 2020-11-19 MED ORDER — POTASSIUM CHLORIDE CRYS ER 20 MEQ PO TBCR
40.0000 meq | EXTENDED_RELEASE_TABLET | Freq: Once | ORAL | Status: AC
  Administered 2020-11-19: 40 meq via ORAL
  Filled 2020-11-19: qty 2

## 2020-11-19 MED ORDER — POTASSIUM CHLORIDE CRYS ER 20 MEQ PO TBCR: 40.0000 meq | EXTENDED_RELEASE_TABLET | Freq: Once | ORAL | Status: DC

## 2020-11-19 SURGICAL SUPPLY — 5 items
CATH BALLN WEDGE 5F 110CM (CATHETERS) ×1 IMPLANT
KIT MICROPUNCTURE NIT STIFF (SHEATH) ×1 IMPLANT
PACK CARDIAC CATHETERIZATION (CUSTOM PROCEDURE TRAY) ×2 IMPLANT
SHEATH GLIDE SLENDER 4/5FR (SHEATH) ×1 IMPLANT
TRANSDUCER W/STOPCOCK (MISCELLANEOUS) ×2 IMPLANT

## 2020-11-19 NOTE — Plan of Care (Signed)

## 2020-11-19 NOTE — Progress Notes (Signed)
Pt transitioned from [facility] to [place]. Pts sats are 100%. RT to continue to monitor.

## 2020-11-19 NOTE — Care Management Important Message (Signed)
Important Message  Patient Details  Name: April [name] MRN: [id number] Date of Birth: 1951/01/11   Medicare Important Message Given:  Yes     Brendaly Townsel 11/19/2020, 1:39 PM

## 2020-11-19 NOTE — Progress Notes (Signed)
PROGRESS NOTE  April [name] WJX:[id number] DOB: Sep 30, 1950   PCP: [name], MD  Patient is from: Home.  Lives with husband.  Uses walker at baseline.  DOA: 11/15/2020 LOS: 3  Chief complaints:  Chief Complaint  Patient presents with   Shortness of Breath     Brief Narrative / Interim history: 70 year old F with PMH of diastolic CHF, DM-2, OZH-0Q, morbid obesity, asthma, OSA not on CPAP, DVT on Eliquis, CAD and ambulatory dysfunction presenting from advanced heart failure clinic with hypoxia to 77%, generalized weakness and near syncope, and admitted for acute on chronic hypoxic and hypercapnic respiratory failure and CHF exacerbation requiring BiPAP.  Also started on IV Lasix.  Cardiology and PCCM following.  Remains on IV Lasix.  Plan for [facility] on 10/19.  May need NIPPV on discharge.    Subjective: Seen and examined earlier this morning.  No major events overnight of this morning.  She refused to wear BiPAP last night.  Again, we had a lengthy discussion about the use of BiPAP.  She is willing to try again tonight.  No complaints but feels tired.  She denies shortness of breath at rest.  She was saturating at 100% on 6 L.  Maintain saturation above 97% when I went up to 4 L.  She denies chest pain, GI or UTI symptoms.  Objective: Vitals:   11/19/20 0612 11/19/20 0700 11/19/20 0852 11/19/20 0855  BP:  119/61    Pulse: 73 61 68   Resp: 17 15 16    Temp:      TempSrc:      SpO2: 99% 95% 100% 100%  Weight: (!) 165.7 kg     Height:        Intake/Output Summary (Last 24 hours) at 11/19/2020 1226 Last data filed at 11/18/2020 2238 Gross per 24 hour  Intake 711 ml  Output 1050 ml  Net -339 ml   Filed Weights   11/17/20 0532 11/18/20 0449 11/19/20 0612  Weight: (!) 166.8 kg (!) 165.9 kg (!) 165.7 kg    Examination:  GENERAL: No apparent distress.  Nontoxic. HEENT: MMM.  Vision and hearing grossly intact.  NECK: Supple.  Recall to assess JVD due to body  habitus. RESP: 97% on 4 L at rest.  No IWOB.  Fair aeration bilaterally. CVS:  RRR. Heart sounds normal.  ABD/GI/GU: BS+. Abd soft, NTND.  MSK/EXT:  Moves extremities. No apparent deformity.  Trace BLE edema. SKIN: no apparent skin lesion or wound NEURO: Awake and alert. Oriented appropriately.  No apparent focal neuro deficit. PSYCH: Calm. Normal affect. Did  Procedures:  None  Microbiology summarized: COVID-19 and influenza PCR nonreactive.  Assessment & Plan: Acute on chronic respiratory failure with hypoxia and hypercapnia-ABG 7.29/80/69/41 on admit suggesting acute on chronic respiratory acidosis.  Likely driven by obstructive process/OSA/possible OHS.   Doubt PE.  She is on Eliquis for DVT although efficacy not well studied in patient population like her.  She seems to be improving.  Currently 97% on 4 L. -Pulmonology following. -Minimal oxygen to keep saturation above 88%.  Discussed with patient and patient's RN -She may need NIPPV/trilogy vent as a bridge until SLP study. -Avoid or minimize sedating medications -Changed Coreg to Bystolic  Acute on chronic diastolic CHF: TTE with LVEF of 65 to 70%, G1 DD, RVSP of 23 mmHg (60 to 65%, G2-DD, and RVSP of 66 mmHg in 05/2020).  Difficult to assess fluid status due to morbid obesity with BMI of 62.  Seems  to be improving with diuretics.  About 1.4 L UOP/24 hours.  Net -4 L so far.  Renal function improved -Cardiology managing -On IV Lasix 80 mg twice daily.  Cardiology adding metolazone. -GDMT-Aldactone, Bystolic -Plan for [facility] tomorrow (10/19) -Monitor fluid status, renal functions and electrolytes -Sodium and fluid restriction  History of asthma-doubt this is playing a role -Continue home inhalers with as needed DuoNebs  AKI on CKD-3A: Improving. Recent Labs    06/11/20 0955 08/14/20 1150 08/30/20 1130 09/10/20 1553 10/02/20 1039 11/15/20 1247 11/16/20 0344 11/17/20 0121 11/18/20 0053 11/19/20 0141  BUN 21 19 19 20 22  21 17 21 21 20   CREATININE 1.46* 1.54* 1.41* 1.26* 1.40* 1.65* 1.38* 1.40* 1.39* 1.35*  -Continue monitoring  History of CAD: Patient denies chest pain.  EKG and troponin reassuring. -Continue home cardiac meds  History of BLE DVT-low suspicion for PE -Continue home Eliquis  Normocytic anemia: Slight drop from admission but seems to be at baseline.  Anemia panel basically normal. Recent Labs    05/11/20 0347 05/12/20 0541 05/13/20 0334 05/15/20 0428 10/02/20 1039 11/15/20 1247 11/15/20 1900 11/17/20 0121 11/18/20 0053 11/19/20 0141  HGB 11.5* 10.9* 10.7* 10.4* 12.7 12.3 13.6 10.9* 10.2* 10.4*  -Monitor H&H  Mild thrombocytopenia: Stable -Continue monitoring  Physical deconditioning/debility-complicated by morbid obesity, osteoarthritis and respiratory failure -Therapy recommended SNF but patient is adamant about going home with home health and DME.  Morbid obesity-this complicates patient's ability to ambulate Body mass index is 62.7 kg/m. -Encourage lifestyle change to lose weight -TOC working on [employer] injection to help with weight loss.         DVT prophylaxis:   apixaban (ELIQUIS) tablet 5 mg  Code Status: Full code Family Communication: Patient and/or RN. Available if any question.  Level of care: Progressive Status is: Inpatient  Remains inpatient appropriate because: due to significantly increased oxygen requirement and need for IV diuretics     Consultants:  Cardiology Pulmonology   Sch Meds:  Scheduled Meds:  (feeding supplement) PROSource Plus  30 mL Oral BID BM   acetaZOLAMIDE  250 mg Oral BID   allopurinol  200 mg Oral Daily   apixaban  5 mg Oral BID   atorvastatin  40 mg Oral QHS   cholecalciferol  1,000 Units Oral Daily   famotidine  20 mg Oral BID   ferrous sulfate  325 mg Oral Q M,W,F   folic acid  5,027 mcg Oral QHS   furosemide  80 mg Intravenous BID   gabapentin  100 mg Oral TID   Gerhardt's butt cream   Topical TID    mometasone-formoterol  2 puff Inhalation BID   montelukast  10 mg Oral QHS   nebivolol  5 mg Oral Daily   Oxcarbazepine  300 mg Oral BID   potassium chloride SA  20 mEq Oral BID   potassium chloride  40 mEq Oral Once   sodium chloride flush  3 mL Intravenous Q12H   spironolactone  25 mg Oral Daily   Continuous Infusions:  sodium chloride     sodium chloride     PRN Meds:.sodium chloride, acetaminophen, albuterol, diclofenac Sodium, hydrOXYzine, ondansetron **OR** ondansetron (ZOFRAN) IV, polyethylene glycol, senna-docusate, sodium chloride, sodium chloride flush  Antimicrobials: Anti-infectives (From admission, onward)    None        I have personally reviewed the following labs and images: CBC: Recent Labs  Lab 11/15/20 1247 11/15/20 1900 11/17/20 0121 11/18/20 0053 11/19/20 0141  WBC 6.0  --  5.9 5.6 5.4  NEUTROABS 4.6  --   --   --   --   HGB 12.3 13.6 10.9* 10.2* 10.4*  HCT 41.3 40.0 37.5 35.1* 35.1*  MCV 103.0*  --  105.0* 104.8* 104.8*  PLT 166  --  143* 142* 146*   BMP &GFR Recent Labs  Lab 11/15/20 1247 11/15/20 1900 11/16/20 0344 11/17/20 0121 11/18/20 0053 11/19/20 0141  NA 139 141 140 136 138 135  K 4.6 4.2 4.3 4.7 4.0 3.7  CL 98  --  97* 96* 97* 95*  CO2 33*  --  35* 31 34* 33*  GLUCOSE 130*  --  105* 119* 128* 145*  BUN 21  --  17 21 21 20   CREATININE 1.65*  --  1.38* 1.40* 1.39* 1.35*  CALCIUM 9.0  --  8.6* 8.2* 8.3* 8.1*  MG  --   --   --  2.2 2.3 2.3  PHOS  --   --   --  4.5 3.6 3.9   Estimated Creatinine Clearance: 60.7 mL/min (A) (by C-G formula based on SCr of 1.35 mg/dL (H)). Liver & Pancreas: Recent Labs  Lab 11/17/20 0121 11/18/20 0053 11/19/20 0141  ALBUMIN 2.8* 2.7* 2.7*   No results for input(s): LIPASE, AMYLASE in the last 168 hours. Recent Labs  Lab 11/15/20 1543  AMMONIA <10   Diabetic: No results for input(s): HGBA1C in the last 72 hours. Recent Labs  Lab 11/18/20 1106 11/18/20 1631 11/18/20 2108  11/19/20 0608 11/19/20 1158  GLUCAP 136* 89 104* 130* 142*   Cardiac Enzymes: No results for input(s): CKTOTAL, CKMB, CKMBINDEX, TROPONINI in the last 168 hours. No results for input(s): PROBNP in the last 8760 hours. Coagulation Profile: No results for input(s): INR, PROTIME in the last 168 hours. Thyroid Function Tests: No results for input(s): TSH, T4TOTAL, FREET4, T3FREE, THYROIDAB in the last 72 hours.  Lipid Profile: No results for input(s): CHOL, HDL, LDLCALC, TRIG, CHOLHDL, LDLDIRECT in the last 72 hours. Anemia Panel: Recent Labs    11/19/20 0141  VITAMINB12 1,681*  FOLATE 38.7  FERRITIN 79  TIBC 248*  IRON 51  RETICCTPCT 2.6   Urine analysis:    Component Value Date/Time   COLORURINE STRAW (A) 02/15/2019 1856   APPEARANCEUR CLEAR 02/15/2019 1856   LABSPEC 1.006 02/15/2019 1856   PHURINE 8.0 02/15/2019 1856   GLUCOSEU NEGATIVE 02/15/2019 1856   GLUCOSEU NEGATIVE 02/19/2015 1707   HGBUR NEGATIVE 02/15/2019 1856   BILIRUBINUR NEGATIVE 02/15/2019 1856   BILIRUBINUR negative 09/05/2013 0832   KETONESUR NEGATIVE 02/15/2019 1856   PROTEINUR NEGATIVE 02/15/2019 1856   UROBILINOGEN 2.0 (A) 02/19/2015 1707   NITRITE NEGATIVE 02/15/2019 1856   LEUKOCYTESUR NEGATIVE 02/15/2019 1856   Sepsis Labs: Invalid input(s): PROCALCITONIN, [place]  Microbiology: Recent Results (from the past 240 hour(s))  Resp Panel by RT-PCR (Flu A&B, Covid) Nasopharyngeal Swab     Status: None   Collection Time: 11/15/20 12:50 PM   Specimen: Nasopharyngeal Swab; Nasopharyngeal(NP) swabs in vial transport medium  Result Value Ref Range Status   SARS Coronavirus 2 by RT PCR NEGATIVE NEGATIVE Final    Comment: (NOTE) SARS-CoV-2 target nucleic acids are NOT DETECTED.  The SARS-CoV-2 RNA is generally detectable in upper respiratory specimens during the acute phase of infection. The lowest concentration of SARS-CoV-2 viral copies this assay can detect is 138 copies/mL. A negative result  does not preclude SARS-Cov-2 infection and should not be used as the sole basis for treatment or other patient management  decisions. A negative result may occur with  improper specimen collection/handling, submission of specimen other than nasopharyngeal swab, presence of viral mutation(s) within the areas targeted by this assay, and inadequate number of viral copies(<138 copies/mL). A negative result must be combined with clinical observations, patient history, and epidemiological information. The expected result is Negative.  Fact Sheet for Patients:  EntrepreneurPulse.com.au  Fact Sheet for Healthcare Providers:  IncredibleEmployment.be  This test is no t yet approved or cleared by the [place] FDA and  has been authorized for detection and/or diagnosis of SARS-CoV-2 by FDA under an Emergency Use Authorization (EUA). This EUA will remain  in effect (meaning this test can be used) for the duration of the COVID-19 declaration under Section 564(b)(1) of the Act, 21 U.S.C.section 360bbb-3(b)(1), unless the authorization is terminated  or revoked sooner.       Influenza A by PCR NEGATIVE NEGATIVE Final   Influenza B by PCR NEGATIVE NEGATIVE Final    Comment: (NOTE) The Xpert Xpress SARS-CoV-2/FLU/RSV plus assay is intended as an aid in the diagnosis of influenza from Nasopharyngeal swab specimens and should not be used as a sole basis for treatment. Nasal washings and aspirates are unacceptable for Xpert Xpress SARS-CoV-2/FLU/RSV testing.  Fact Sheet for Patients: EntrepreneurPulse.com.au  Fact Sheet for Healthcare Providers: IncredibleEmployment.be  This test is not yet approved or cleared by the [place] FDA and has been authorized for detection and/or diagnosis of SARS-CoV-2 by FDA under an Emergency Use Authorization (EUA). This EUA will remain in effect (meaning this test can be used) for  the duration of the COVID-19 declaration under Section 564(b)(1) of the Act, 21 U.S.C. section 360bbb-3(b)(1), unless the authorization is terminated or revoked.  Performed at [facility], [place] [address] [place], [place] [address]     Radiology Studies: ECHOCARDIOGRAM COMPLETE  Result Date: 11/18/2020    ECHOCARDIOGRAM REPORT   Patient Name:   Sussie PAGE Riege Date of Exam: 11/18/2020 Medical Rec #:  [id number]         Height:       64.0 in Accession #:    [id number]        Weight:       365.7 lb Date of Birth:  December 17, 1950          BSA:          2.529 m Patient Age:    67 years          BP:           130/66 mmHg Patient Gender: F                 HR:           74 bpm. Exam Location:  Inpatient Procedure: 2D Echo, Cardiac Doppler and Color Doppler Indications:    CHF. RVF. OSA  History:        Patient has prior history of Echocardiogram examinations, most                 recent 05/10/2020. CHF, Stroke; Risk Factors:Hypertension,                 Dyslipidemia and OSA.  Sonographer:    Merrie Roof RDCS Referring Phys: [id number] [place]  1. Left ventricular ejection fraction, by estimation, is 65 to 70%. The left ventricle has normal function. The left ventricle has no regional wall motion abnormalities. There is mild left ventricular hypertrophy. Left ventricular diastolic parameters are consistent with  Grade I diastolic dysfunction (impaired relaxation). There is the interventricular septum is flattened in systole and diastole, consistent with right ventricular pressure and volume overload.  2. Right ventricular systolic function is normal. The right ventricular size is mildly enlarged. There is normal pulmonary artery systolic pressure. The estimated right ventricular systolic pressure is 74.2 mmHg.  3. The mitral valve is normal in structure. Mild mitral valve regurgitation. No evidence of mitral stenosis.  4. Poor TR jet signal.  5. The aortic valve is normal in structure.  Aortic valve regurgitation is not visualized. No aortic stenosis is present.  6. The inferior vena cava is normal in size with greater than 50% respiratory variability, suggesting right atrial pressure of 3 mmHg. Comparison(s): PA pressure likely underestimated when compared to prior (66 mmHg). FINDINGS  Left Ventricle: Left ventricular ejection fraction, by estimation, is 65 to 70%. The left ventricle has normal function. The left ventricle has no regional wall motion abnormalities. The left ventricular internal cavity size was normal in size. There is  mild left ventricular hypertrophy. The interventricular septum is flattened in systole and diastole, consistent with right ventricular pressure and volume overload. Left ventricular diastolic parameters are consistent with Grade I diastolic dysfunction (impaired relaxation). Right Ventricle: The right ventricular size is mildly enlarged. No increase in right ventricular wall thickness. Right ventricular systolic function is normal. There is normal pulmonary artery systolic pressure. The tricuspid regurgitant velocity is 2.23  m/s, and with an assumed right atrial pressure of 3 mmHg, the estimated right ventricular systolic pressure is 59.5 mmHg. Left Atrium: Left atrial size was normal in size. Right Atrium: Right atrial size was normal in size. Pericardium: There is no evidence of pericardial effusion. Mitral Valve: The mitral valve is normal in structure. Mild mitral annular calcification. Mild mitral valve regurgitation. No evidence of mitral valve stenosis. Tricuspid Valve: Poor TR jet signal. The tricuspid valve is normal in structure. Tricuspid valve regurgitation is mild . No evidence of tricuspid stenosis. Aortic Valve: The aortic valve is normal in structure. Aortic valve regurgitation is not visualized. No aortic stenosis is present. Aortic valve mean gradient measures 5.0 mmHg. Aortic valve peak gradient measures 9.1 mmHg. Aortic valve area, by VTI  measures 2.11 cm. Pulmonic Valve: The pulmonic valve was normal in structure. Pulmonic valve regurgitation is not visualized. No evidence of pulmonic stenosis. Aorta: The aortic root is normal in size and structure. Venous: The inferior vena cava is normal in size with greater than 50% respiratory variability, suggesting right atrial pressure of 3 mmHg. IAS/Shunts: No atrial level shunt detected by color flow Doppler.  LEFT VENTRICLE PLAX 2D LVIDd:         3.80 cm   Diastology LVIDs:         2.50 cm   LV e' medial:    4.24 cm/s LV PW:         1.30 cm   LV E/e' medial:  18.9 LV IVS:        1.20 cm   LV e' lateral:   7.18 cm/s LVOT diam:     1.90 cm   LV E/e' lateral: 11.2 LV SV:         71 LV SV Index:   28 LVOT Area:     2.84 cm  RIGHT VENTRICLE            IVC RV Basal diam:  4.60 cm    IVC diam: 2.10 cm RV Mid diam:    3.80 cm RV  S prime:     9.68 cm/s TAPSE (M-mode): 1.9 cm LEFT ATRIUM           Index        RIGHT ATRIUM           Index LA diam:      3.50 cm 1.38 cm/m   RA Area:     19.40 cm LA Vol (A4C): 53.0 ml 20.96 ml/m  RA Volume:   52.00 ml  20.57 ml/m  AORTIC VALVE AV Area (Vmax):    2.10 cm AV Area (Vmean):   2.05 cm AV Area (VTI):     2.11 cm AV Vmax:           151.00 cm/s AV Vmean:          103.000 cm/s AV VTI:            0.338 m AV Peak Grad:      9.1 mmHg AV Mean Grad:      5.0 mmHg LVOT Vmax:         112.00 cm/s LVOT Vmean:        74.600 cm/s LVOT VTI:          0.251 m LVOT/AV VTI ratio: 0.74  AORTA Ao Root diam: 2.80 cm Ao Asc diam:  2.80 cm MITRAL VALVE               TRICUSPID VALVE MV Area (PHT): 2.80 cm    TR Peak grad:   19.9 mmHg MV Decel Time: 271 msec    TR Vmax:        223.00 cm/s MV E velocity: 80.20 cm/s MV A velocity: 83.00 cm/s  SHUNTS MV E/A ratio:  0.97        Systemic VTI:  0.25 m                            Systemic Diam: 1.90 cm Candee Furbish MD Electronically signed by Candee Furbish MD Signature Date/Time: 11/18/2020/5:42:12 PM    Final        Taryll Reichenberger T. [place]  If 7PM-7AM, please contact night-coverage [email or web address] 11/19/2020, 12:26 PM

## 2020-11-19 NOTE — Progress Notes (Signed)
NAME:  April [name], MRN:  [id number], DOB:  [birth year], LOS: 3 ADMISSION DATE:  11/15/2020, CONSULTATION DATE: 11/18/2020 REFERRING MD: [facility]ad, CHIEF COMPLAINT: Hypoxia hypercarbia  History of Present Illness:  70 year old female who is morbidly obese is 368 pounds with a history of obstructive sleep apnea diagnosed 5 years ago by Dr. Gwenette Greet of pulmonary division and recently seen in the hospital 05/24/2020 with lower extremity for which she was placed on Eliquis and was to be set up for a sleep study and she was seen by Dr. Beckie Salts the pulmonary division and has been scheduled for a sleep study.  Her arterial blood gas with pH 7.29 PCO2 of 80 and PO2 of 60 is consistent with obesity hypoventilation syndrome.  She reports having trouble breathing for going on 2 months.  She also has increased weakness difficulty ambulating and has had difficulty sleeping for years.  Her sleep disruption is magnified by her osteoarthritis generalized pain in her obstructive sleep apnea.  She is improved since she has been in the hospital with aggressive diuresis she is currently sitting up in chair eating awake alert neurologically intact and in no respiratory distress during examination.  She has been instructed she needs to wear her BiPAP alternatives of tracheostomy is offered to her which she showed little interest in.  If she will wear her BiPAP significant.  Tonight I believe she will improve.  If not the most likely she will continue to deteriorate over time.  Pertinent  Medical History   Past Medical History:  Diagnosis Date   Anemia    Asthma    CAD (coronary artery disease)    Carpal tunnel syndrome    Cellulitis of both lower extremities 04/11/2015   CHF (congestive heart failure) (HCC)    Chronic kidney disease    [facility]- followed by [facility]   Colon polyp, hyperplastic 2620 & 3559   Complication of anesthesia 1999   svt with renal calculi surgery, no problems since   CTS (carpal tunnel  syndrome)    bilateral   Diabetes mellitus    Eczema    GERD (gastroesophageal reflux disease)    History of kidney stones 1999   Hyperlipidemia    Hypertension    Leg ulcer ([place]) 04/24/2015   Right lateral leg No evidence of an infection Monitor closely Keep edema controlled    Leg ulcer (HCC)    right lower   Meralgia paresthetica    Dr. [name]   Morbid obesity ([place])    Neuropathy    toes and legs   Osteoarthrosis, unspecified whether generalized or localized, lower leg    knee   PUD (peptic ulcer disease)    Shortness of breath dyspnea    with exertion   Sleep apnea    per progress note 02/25/2018   Type II or unspecified type diabetes mellitus without mention of complication, not stated as uncontrolled    Unspecified hereditary and idiopathic peripheral neuropathy    Urticaria    Vitamin B12 deficiency    Wears glasses    Wound cellulitis    right upper leg, healing well     Significant Hospital Events: Including procedures, antibiotic start and stop dates in addition to other pertinent events     Interim History / Subjective:  Sitting up in chair eating. Weary of BIPAP use because of mask. Explained no other option. She will be in hospital all the time and risk of dying without it. Says she will try again, has  said to other providers this admission and continues to not use.  Objective   Blood pressure 119/61, pulse 68, temperature 98.9 F (37.2 C), temperature source Oral, resp. rate 16, height 5\' 4"  (1.626 m), weight (!) 165.7 kg, SpO2 100 %.    FiO2 (%):  [40 %] 40 %   Intake/Output Summary (Last 24 hours) at 11/19/2020 1013 Last data filed at 11/18/2020 2238 Gross per 24 hour  Intake 711 ml  Output 1050 ml  Net -339 ml    Filed Weights   11/17/20 0532 11/18/20 0449 11/19/20 0612  Weight: (!) 166.8 kg (!) 165.9 kg (!) 165.7 kg    Examination: General: sitting up eating. HENT: Cannot appreciate JVD or lymphadenopathy due to habitus Lungs: Decreased air  movement throughout Cardiovascular: Heart sounds are regular Abdomen: Morbidly obese and soft Extremities: edema lower extremities Neuro: Grossly intact without focal defect GU: Voids  Resolved Hospital Problem list     Assessment & Plan:  Acute on chronic hypercarbic hypoxic respiratory failure in the setting of obstructive sleep apnea/obesity hypoventilation syndrome/morbid obesity/history of asthma /congestive heart failure. O2 as needed to maintain O2 saturations greater than 90% It was explained in great detail to her that she will need to wear BiPAP at night to prevent worsening hypoxia and hypercarbia which is life threatening and will lead to worsened fluid retention and recurrent hospitalization.  The other option would be a tracheostomy.  She did not want tracheostomy at this time.  She said she would try to wear the BiPAP at night - she did not overnight 10/17 to 10/18 Stressed importance of BiPAP use - may be difficult to obtain  Recommend ABG morning after BiPAP use to see if would qualify for Trilogy ventilator as bridge to sleep study as she is high risk of clinical decompensation without NIPPV Agree with aggressive diuresis per cardiology Avoid all sedating medication Pulmonary toilet as needed, currently on as needed Proventil along with Dulera twice daily Singulair nightly. She has been seen by the [place] pulmonary and has a sleep study ordered - stressed importance of this to optimize NIPPV at home  History of bilateral lower extremity DVT negative PE Continue Eliquis   Best Practice (right click and "Reselect all SmartList Selections" daily)   Per primary  Labs   CBC: Recent Labs  Lab 11/15/20 1247 11/15/20 1900 11/17/20 0121 11/18/20 0053 11/19/20 0141  WBC 6.0  --  5.9 5.6 5.4  NEUTROABS 4.6  --   --   --   --   HGB 12.3 13.6 10.9* 10.2* 10.4*  HCT 41.3 40.0 37.5 35.1* 35.1*  MCV 103.0*  --  105.0* 104.8* 104.8*  PLT 166  --  143* 142* 146*      Basic Metabolic Panel: Recent Labs  Lab 11/15/20 1247 11/15/20 1900 11/16/20 0344 11/17/20 0121 11/18/20 0053 11/19/20 0141  NA 139 141 140 136 138 135  K 4.6 4.2 4.3 4.7 4.0 3.7  CL 98  --  97* 96* 97* 95*  CO2 33*  --  35* 31 34* 33*  GLUCOSE 130*  --  105* 119* 128* 145*  BUN 21  --  17 21 21 20   CREATININE 1.65*  --  1.38* 1.40* 1.39* 1.35*  CALCIUM 9.0  --  8.6* 8.2* 8.3* 8.1*  MG  --   --   --  2.2 2.3 2.3  PHOS  --   --   --  4.5 3.6 3.9    GFR: Estimated Creatinine  Clearance: 60.7 mL/min (A) (by C-G formula based on SCr of 1.35 mg/dL (H)). Recent Labs  Lab 11/15/20 1247 11/17/20 0121 11/18/20 0053 11/19/20 0141  WBC 6.0 5.9 5.6 5.4     Liver Function Tests: Recent Labs  Lab 11/17/20 0121 11/18/20 0053 11/19/20 0141  ALBUMIN 2.8* 2.7* 2.7*    No results for input(s): LIPASE, AMYLASE in the last 168 hours. Recent Labs  Lab 11/15/20 1543  AMMONIA <10     ABG    Component Value Date/Time   PHART 7.290 (L) 11/15/2020 1900   PCO2ART 79.9 (HH) 11/15/2020 1900   PO2ART 69 (L) 11/15/2020 1900   HCO3 38.4 (H) 11/15/2020 1900   TCO2 41 (H) 11/15/2020 1900   O2SAT 90.0 11/15/2020 1900      Coagulation Profile: No results for input(s): INR, PROTIME in the last 168 hours.  Cardiac Enzymes: No results for input(s): CKTOTAL, CKMB, CKMBINDEX, TROPONINI in the last 168 hours.  HbA1C: Hemoglobin A1C  Date/Time Value Ref Range Status  09/13/2020 04:56 PM 6.0 (A) 4.0 - 5.6 % Final   HbA1c, POC (prediabetic range)  Date/Time Value Ref Range Status  09/13/2020 04:56 PM 6.0 5.7 - 6.4 % Final   HbA1c, POC (controlled diabetic range)  Date/Time Value Ref Range Status  09/13/2020 04:56 PM 6.0 0.0 - 7.0 % Final   HbA1c POC (<> result, manual entry)  Date/Time Value Ref Range Status  09/13/2020 04:56 PM 6.0 4.0 - 5.6 % Final   Hgb A1c MFr Bld  Date/Time Value Ref Range Status  03/08/2020 01:47 PM 6.4 4.6 - 6.5 % Final    Comment:    Glycemic  Control Guidelines for People with Diabetes:Non Diabetic:  <6%Goal of Therapy: <7%Additional Action Suggested:  >8%   09/08/2018 12:20 AM 6.0 (H) 4.8 - 5.6 % Final    Comment:    (NOTE) Pre diabetes:          5.7%-6.4% Diabetes:              >6.4% Glycemic control for   <7.0% adults with diabetes     CBG: Recent Labs  Lab 11/18/20 0643 11/18/20 1106 11/18/20 1631 11/18/20 2108 11/19/20 0608  GLUCAP 122* 136* 89 104* 130*     Review of Systems:   12 point review of system taken, please see HPI for positives and negatives.   Past Medical History:  She,  has a past medical history of Anemia, Asthma, CAD (coronary artery disease), Carpal tunnel syndrome, Cellulitis of both lower extremities (04/11/2015), CHF (congestive heart failure) ([place]), Chronic kidney disease, Colon polyp, hyperplastic (2426 & 8341), Complication of anesthesia (1999), CTS (carpal tunnel syndrome), Diabetes mellitus, Eczema, GERD (gastroesophageal reflux disease), History of kidney stones (1999), Hyperlipidemia, Hypertension, Leg ulcer ([place]) (04/24/2015), Leg ulcer ([place]), Meralgia paresthetica, Morbid obesity ([place]), Neuropathy, Osteoarthrosis, unspecified whether generalized or localized, lower leg, PUD (peptic ulcer disease), Shortness of breath dyspnea, Sleep apnea, Type II or unspecified type diabetes mellitus without mention of complication, not stated as uncontrolled, Unspecified hereditary and idiopathic peripheral neuropathy, Urticaria, Vitamin B12 deficiency, Wears glasses, and Wound cellulitis.   Surgical History:   Past Surgical History:  Procedure Laterality Date   ABDOMINAL HYSTERECTOMY     BREAST BIOPSY Right 07/08/2018   CARDIAC CATHETERIZATION  2002   non obstructive disease   colonoscopy with polypectomy  2007 & 2012    hyperplastic ;Dr Watt Climes   COLONOSCOPY WITH PROPOFOL N/A 06/04/2015   Procedure: COLONOSCOPY WITH PROPOFOL;  Surgeon: Jerene Bears, MD;  Location: [facility];  Service:  Gastroenterology;  Laterality: N/A;   DEBRIDEMENT LEG Right 03/02/2018   WOUND VAC APPLIED   DEBRIDEMENT LEG Right 05/27/2018   RIGHT LOWER LEG DEBRIDEMENT,  SKIN GRAFT, VAC PLACEMENT    DILATION AND CURETTAGE OF UTERUS     multiple   HEMORRHOID SURGERY     I & D EXTREMITY Right 03/02/2018   Procedure: RIGHT LEG DEBRIDEMENT AND PLACE VAC;  Surgeon: [name], MD;  Location: [place];  Service: Orthopedics;  Laterality: Right;   I & D EXTREMITY Right 03/04/2018   Procedure: REPEAT IRRIGATION AND DEBRIDEMENT RIGHT LEG, PLACE WOUND VAC;  Surgeon: [name], MD;  Location: [place];  Service: Orthopedics;  Laterality: Right;   I & D EXTREMITY Right 03/30/2018   Procedure: IRRIGATION AND DEBRIDEMENT RIGHT LEG, APPLY WOUND VAC;  Surgeon: [name], MD;  Location: [place];  Service: Orthopedics;  Laterality: Right;   I & D EXTREMITY Right 05/27/2018   Procedure: RIGHT LOWER LEG DEBRIDEMENT,  SKIN GRAFT, VAC PLACEMENT;  Surgeon: [name], MD;  Location: [place];  Service: Orthopedics;  Laterality: Right;  RIGHT LOWER LEG DEBRIDEMENT,  SKIN GRAFT, VAC PLACEMENT   renal calculi  12/1997   SVT with induction of anesthesia   right knee arthroscopy     SKIN SPLIT GRAFT Right 03/04/2018   Procedure: POSSIBLE SPLIT THICKNESS SKIN GRAFT;  Surgeon: [name], MD;  Location: [place];  Service: Orthopedics;  Laterality: Right;   SKIN SPLIT GRAFT Right 04/01/2018   Procedure: REPEAT IRRIGATION AND DEBRIDEMENT RIGHT LEG, APPLY SPLIT THICKNESS SKIN GRAFT;  Surgeon: [name], MD;  Location: [place];  Service: Orthopedics;  Laterality: Right;   TONSILLECTOMY AND ADENOIDECTOMY       Social History:   reports that she has never smoked. She has never used smokeless tobacco. She reports that she does not drink alcohol and does not use drugs.   Family History:  Her family history includes Breast cancer in her cousin, maternal aunt, and maternal aunt; Colon cancer in her father and mother; Diabetes in  her maternal aunt, maternal uncle, paternal aunt, and paternal uncle; Heart disease in her paternal aunt; Prostate cancer in her father; Stroke in her paternal aunt.   Allergies Allergies  Allergen Reactions   Penicillins Rash and Other (See Comments)    She was told not to take it anymore. Has patient had a PCN reaction causing immediate rash, facial/tongue/throat swelling, SOB or lightheadedness with hypotension: Yes Has patient had a PCN reaction causing severe rash involving mucus membranes or skin necrosis: No Has patient had a PCN reaction that required hospitalization: No Has patient had a PCN reaction occurring within the last 10 years: No If all of the above answers are "NO", then may proceed with Cephalosporin use.'   Shellfish Allergy Anaphylaxis   Sulfonamide Derivatives Anaphylaxis and Other (See Comments)   Hydrochlorothiazide W-Triamterene Other (See Comments)    Hypokalemia   Lotensin [Benazepril Hcl] Hives   Dipyridamole Other (See Comments)    Unknown reaction   Estrogens Other (See Comments)    Unknown reaction   Hydrochlorothiazide Other (See Comments)    Unknown reaction   Latex Other (See Comments)    Unknown reaction - stated previously during surgery gloves had to be changed, but unsure if it is still an allergy or not.    Metronidazole Other (See Comments)    Unknown reaction   Other Itching  PICKLES   Spironolactone Other (See Comments)    UNSPECIFIED > "kidney problems"   Sulfa Antibiotics Other (See Comments)    Unknown reaction   Torsemide Other (See Comments)    Unknown reaction   Valsartan Other (See Comments)    Unknown reaction   Mustard [Allyl Isothiocyanate] Itching     Home Medications  Prior to Admission medications   Medication Sig Start Date End Date Taking? Authorizing Provider  acetaminophen (TYLENOL) 325 MG tablet Take 2 tablets (650 mg total) by mouth every 6 (six) hours as needed for moderate pain (or Fever >/= 101). 11/16/18   Yes Arrien, Jimmy Picket, MD  allopurinol (ZYLOPRIM) 100 MG tablet Take 2 tablets (200 mg total) by mouth daily. 06/27/20  Yes Burns, Claudina Lick, MD  apixaban (ELIQUIS) 5 MG TABS tablet Take 5 mg by mouth 2 (two) times daily.   Yes [provider]  Budesonide (PULMICORT FLEXHALER) 90 MCG/ACT inhaler Inhale 1 puff into the lungs 2 (two) times daily.   Yes [provider]  budesonide-formoterol (SYMBICORT) 160-4.5 MCG/ACT inhaler one - two inhalations every 12 hours; gargle and spit after use Patient taking differently: Inhale 1-2 puffs into the lungs every 12 (twelve) hours. 02/19/15  Yes Burns, Claudina Lick, MD  calcium carbonate (TUMS - DOSED IN MG ELEMENTAL CALCIUM) 500 MG chewable tablet Chew 2 tablets by mouth daily as needed for indigestion or heartburn.   Yes [provider]  carvedilol (COREG) 12.5 MG tablet TAKE 1 TABLET(12.5 MG) BY MOUTH TWICE DAILY WITH A MEAL Patient taking differently: Take 12.5 mg by mouth 2 (two) times daily with a meal. 09/16/20  Yes Burns, Claudina Lick, MD  cholecalciferol (VITAMIN D3) 25 MCG (1000 UNIT) tablet Take 1 tablet (1,000 Units total) by mouth daily. 09/16/20  Yes Burns, Claudina Lick, MD  colchicine 0.6 MG tablet Take 2 tabs po at onset of gout flare, then one hour later take 1 tab Patient taking differently: Take 1.2 mg by mouth daily as needed (gout flare). 06/28/20  Yes Burns, Claudina Lick, MD  cyanocobalamin (,VITAMIN B-12,) 1000 MCG/ML injection INJECT 1ML INTO MUSCLE EVERY 30 DAYS Patient taking differently: Inject 1,000 mcg into the skin every 30 (thirty) days. 07/31/20  Yes Burns, Claudina Lick, MD  diclofenac sodium (VOLTAREN) 1 % GEL Apply 2 g topically 4 (four) times daily. Patient taking differently: Apply 2 g topically 4 (four) times daily as needed (dry skin, rash). 03/20/16  Yes Marcial Pacas, MD  eszopiclone (LUNESTA) 1 MG TABS tablet Take 1 tablet (1 mg total) by mouth at bedtime as needed for sleep. Take immediately before bedtime Patient taking  differently: Take 1 mg by mouth at bedtime as needed for sleep. 10/02/20 11/15/20 Yes Olalere, Adewale A, MD  famotidine (PEPCID) 20 MG tablet Take 1 tablet (20 mg total) by mouth 2 (two) times daily. 11/11/20  Yes Burns, Claudina Lick, MD  FEROSUL 325 (65 Fe) MG tablet TAKE 1 TABLET BY MOUTH EVERY MONDAY, [place], AND FRIDAY Patient taking differently: Take 325 mg by mouth every Monday, Wednesday, and Friday. 10/17/20  Yes Burns, Claudina Lick, MD  folic acid (FOLVITE) 401 MCG tablet Take 1 tablet (400 mcg total) by mouth at bedtime. Take 400 mcg by mouth at bedtime. Patient taking differently: Take 400 mcg by mouth at bedtime. 03/12/20  Yes Burns, Claudina Lick, MD  gabapentin (NEURONTIN) 100 MG capsule TAKE 1 CAPSULE(100 MG) BY MOUTH THREE TIMES DAILY Patient taking differently: Take 100 mg by mouth 3 (three) times  daily. 09/16/20  Yes Burns, Claudina Lick, MD  hydrOXYzine (ATARAX/VISTARIL) 25 MG tablet Take 1 tablet (25 mg total) by mouth 3 (three) times daily as needed for itching. 03/01/19  Yes Fair, Marin Shutter, MD  montelukast (SINGULAIR) 10 MG tablet Take 1 tablet (10 mg total) by mouth at bedtime. 03/12/20  Yes Burns, Claudina Lick, MD  Nutritional Supplements (,FEEDING SUPPLEMENT, PROSOURCE PLUS) liquid Take 30 mLs by mouth 2 (two) times daily between meals. 05/15/20  Yes Antonieta Pert, MD  Oxcarbazepine (TRILEPTAL) 300 MG tablet Take 1 tablet (300 mg total) by mouth 2 (two) times daily. 06/03/20  Yes Burns, Claudina Lick, MD  oxyCODONE (OXY IR/ROXICODONE) 5 MG immediate release tablet Take 1 tablet (5 mg total) by mouth every 6 (six) hours as needed for severe pain (chronic knee and back pain). 10/28/20  Yes Burns, Claudina Lick, MD  polyethylene glycol (MIRALAX / GLYCOLAX) 17 g packet Take 17 g by mouth daily. Patient taking differently: Take 17 g by mouth daily as needed for mild constipation. 03/02/19  Yes Fair, Marin Shutter, MD  potassium chloride SA (KLOR-CON) 20 MEQ tablet Take 20 mEq by mouth 3 (three) times daily. 11/09/20  Yes [provider]  senna-docusate (SENOKOT-S) 8.6-50 MG tablet Take 1 tablet by mouth at bedtime as needed for mild constipation. 03/01/19  Yes Fair, Marin Shutter, MD  simvastatin (ZOCOR) 40 MG tablet Take 1 tablet (40 mg total) by mouth at bedtime. 05/06/20  Yes Burns, Claudina Lick, MD  spironolactone (ALDACTONE) 25 MG tablet TAKE 1 TABLET(25 MG) BY MOUTH DAILY Patient taking differently: Take 25 mg by mouth daily. 10/28/20  Yes Burns, Claudina Lick, MD  torsemide (DEMADEX) 20 MG tablet Take 3 tablets (60 mg total) by mouth 2 (two) times daily. May take an additional tablet as directed by CHF clinic. 09/03/20 12/02/20 Yes [name], Maricela Bo, FNP  VENTOLIN HFA 108 (90 Base) MCG/ACT inhaler Inhale 2 puffs into the lungs every 4 (four) hours as needed for wheezing. 11/13/19  Yes [provider]  Alcohol Swabs (B-D SINGLE USE SWABS REGULAR) PADS UAD   E11.9 10/14/20   [name], MD  Blood Glucose Calibration (TRUE METRIX LEVEL 1) Low SOLN UAD  E11.9 10/14/20   [name], MD     Critical care time: 25 min    Richardson Landry Minor ACNP Acute Care Nurse Practitioner [place] Please consult [address] 11/19/2020, 10:13 AM

## 2020-11-19 NOTE — H&P (View-Only) (Signed)
Advanced Heart Failure Rounding Note  PCP-Cardiologist: Dr. Haroldine Laws   Subjective:    Yesterday diuresed with IV lasix + diamox. I/O not accurate. Weight not that different. Able to get to the chair with assistance but poor tolerance reported.    Echo 65-70% RV normal   Creatinine 1.65 on admit>>1.35  Pulmonary consulted with recommendations for Bipap at d/c Remains on HFNC FiO2 40% 15 L  Denies SOB.    Objective:   Weight Range: (!) 165.7 kg Body mass index is 62.7 kg/m.   Vital Signs:   Temp:  [98.1 F (36.7 C)-98.9 F (37.2 C)] 98.9 F (37.2 C) (10/18 0314) Pulse Rate:  [67-83] 73 (10/18 0612) Resp:  [15-20] 17 (10/18 0612) BP: (94-130)/(59-73) 126/59 (10/18 0315) SpO2:  [90 %-99 %] 99 % (10/18 0612) FiO2 (%):  [40 %] 40 % (10/18 0315) Weight:  [165.7 kg] 165.7 kg (10/18 0612) Last BM Date: 11/18/20  Weight change: Filed Weights   11/17/20 0532 11/18/20 0449 11/19/20 0612  Weight: (!) 166.8 kg (!) 165.9 kg (!) 165.7 kg    Intake/Output:   Intake/Output Summary (Last 24 hours) at 11/19/2020 0713 Last data filed at 11/18/2020 2238 Gross per 24 hour  Intake 948 ml  Output 1350 ml  Net -402 ml      Physical Exam    General:  No resp difficulty. In bed  HEENT: normal Neck: supple. JVP to jaw. Carotids 2+ bilat; no bruits. No lymphadenopathy or thryomegaly appreciated. Cor: PMI nondisplaced. Regular rate & rhythm. No rubs, gallops or murmurs. Lungs: clear on HFNC Abdomen: soft, nontender, nondistended. No hepatosplenomegaly. No bruits or masses. Good bowel sounds. Extremities: no cyanosis, clubbing, rash, No lower extremity edema.  Neuro: alert & orientedx3, cranial nerves grossly intact. moves all 4 extremities w/o difficulty. Affect flat    Telemetry   NSR 80s personally reviewed.   EKG    No new EKG to review   Labs    CBC Recent Labs    11/18/20 0053 11/19/20 0141  WBC 5.6 5.4  HGB 10.2* 10.4*  HCT 35.1* 35.1*  MCV 104.8*  104.8*  PLT 142* 938*   Basic Metabolic Panel Recent Labs    11/18/20 0053 11/19/20 0141  NA 138 135  K 4.0 3.7  CL 97* 95*  CO2 34* 33*  GLUCOSE 128* 145*  BUN 21 20  CREATININE 1.39* 1.35*  CALCIUM 8.3* 8.1*  MG 2.3 2.3  PHOS 3.6 3.9   Liver Function Tests Recent Labs    11/18/20 0053 11/19/20 0141  ALBUMIN 2.7* 2.7*   No results for input(s): LIPASE, AMYLASE in the last 72 hours. Cardiac Enzymes No results for input(s): CKTOTAL, CKMB, CKMBINDEX, TROPONINI in the last 72 hours.  BNP: BNP (last 3 results) Recent Labs    08/14/20 1150 11/15/20 1247 11/18/20 0053  BNP 53.1 129.4* 110.8*    ProBNP (last 3 results) No results for input(s): PROBNP in the last 8760 hours.   D-Dimer No results for input(s): DDIMER in the last 72 hours. Hemoglobin A1C No results for input(s): HGBA1C in the last 72 hours. Fasting Lipid Panel No results for input(s): CHOL, HDL, LDLCALC, TRIG, CHOLHDL, LDLDIRECT in the last 72 hours. Thyroid Function Tests No results for input(s): TSH, T4TOTAL, T3FREE, THYROIDAB in the last 72 hours.  Invalid input(s): FREET3   Other results:   Imaging    ECHOCARDIOGRAM COMPLETE  Result Date: 11/18/2020    ECHOCARDIOGRAM REPORT   Patient Name:   April [name]  Bocek Date of Exam: 11/18/2020 Medical Rec #:  [id number]         Height:       64.0 in Accession #:    [id number]        Weight:       365.7 lb Date of Birth:  [birth year]          BSA:          2.529 m Patient Age:    70 years          BP:           130/66 mmHg Patient Gender: F                 HR:           74 bpm. Exam Location:  Inpatient Procedure: 2D Echo, Cardiac Doppler and Color Doppler Indications:    CHF. RVF. OSA  History:        Patient has prior history of Echocardiogram examinations, most                 recent 05/10/2020. CHF, Stroke; Risk Factors:Hypertension,                 Dyslipidemia and OSA.  Sonographer:    Merrie Roof RDCS Referring Phys: [id number] [place]  1. Left ventricular ejection fraction, by estimation, is 65 to 70%. The left ventricle has normal function. The left ventricle has no regional wall motion abnormalities. There is mild left ventricular hypertrophy. Left ventricular diastolic parameters are consistent with Grade I diastolic dysfunction (impaired relaxation). There is the interventricular septum is flattened in systole and diastole, consistent with right ventricular pressure and volume overload.  2. Right ventricular systolic function is normal. The right ventricular size is mildly enlarged. There is normal pulmonary artery systolic pressure. The estimated right ventricular systolic pressure is 96.2 mmHg.  3. The mitral valve is normal in structure. Mild mitral valve regurgitation. No evidence of mitral stenosis.  4. Poor TR jet signal.  5. The aortic valve is normal in structure. Aortic valve regurgitation is not visualized. No aortic stenosis is present.  6. The inferior vena cava is normal in size with greater than 50% respiratory variability, suggesting right atrial pressure of 3 mmHg. Comparison(s): PA pressure likely underestimated when compared to prior (66 mmHg). FINDINGS  Left Ventricle: Left ventricular ejection fraction, by estimation, is 65 to 70%. The left ventricle has normal function. The left ventricle has no regional wall motion abnormalities. The left ventricular internal cavity size was normal in size. There is  mild left ventricular hypertrophy. The interventricular septum is flattened in systole and diastole, consistent with right ventricular pressure and volume overload. Left ventricular diastolic parameters are consistent with Grade I diastolic dysfunction (impaired relaxation). Right Ventricle: The right ventricular size is mildly enlarged. No increase in right ventricular wall thickness. Right ventricular systolic function is normal. There is normal pulmonary artery systolic pressure. The tricuspid regurgitant  velocity is 2.23  m/s, and with an assumed right atrial pressure of 3 mmHg, the estimated right ventricular systolic pressure is 83.6 mmHg. Left Atrium: Left atrial size was normal in size. Right Atrium: Right atrial size was normal in size. Pericardium: There is no evidence of pericardial effusion. Mitral Valve: The mitral valve is normal in structure. Mild mitral annular calcification. Mild mitral valve regurgitation. No evidence of mitral valve stenosis. Tricuspid Valve: Poor TR jet signal. The tricuspid valve is normal in structure. Tricuspid valve  regurgitation is mild . No evidence of tricuspid stenosis. Aortic Valve: The aortic valve is normal in structure. Aortic valve regurgitation is not visualized. No aortic stenosis is present. Aortic valve mean gradient measures 5.0 mmHg. Aortic valve peak gradient measures 9.1 mmHg. Aortic valve area, by VTI measures 2.11 cm. Pulmonic Valve: The pulmonic valve was normal in structure. Pulmonic valve regurgitation is not visualized. No evidence of pulmonic stenosis. Aorta: The aortic root is normal in size and structure. Venous: The inferior vena cava is normal in size with greater than 50% respiratory variability, suggesting right atrial pressure of 3 mmHg. IAS/Shunts: No atrial level shunt detected by color flow Doppler.  LEFT VENTRICLE PLAX 2D LVIDd:         3.80 cm   Diastology LVIDs:         2.50 cm   LV e' medial:    4.24 cm/s LV PW:         1.30 cm   LV E/e' medial:  18.9 LV IVS:        1.20 cm   LV e' lateral:   7.18 cm/s LVOT diam:     1.90 cm   LV E/e' lateral: 11.2 LV SV:         71 LV SV Index:   28 LVOT Area:     2.84 cm  RIGHT VENTRICLE            IVC RV Basal diam:  4.60 cm    IVC diam: 2.10 cm RV Mid diam:    3.80 cm RV S prime:     9.68 cm/s TAPSE (M-mode): 1.9 cm LEFT ATRIUM           Index        RIGHT ATRIUM           Index LA diam:      3.50 cm 1.38 cm/m   RA Area:     19.40 cm LA Vol (A4C): 53.0 ml 20.96 ml/m  RA Volume:   52.00 ml  20.57  ml/m  AORTIC VALVE AV Area (Vmax):    2.10 cm AV Area (Vmean):   2.05 cm AV Area (VTI):     2.11 cm AV Vmax:           151.00 cm/s AV Vmean:          103.000 cm/s AV VTI:            0.338 m AV Peak Grad:      9.1 mmHg AV Mean Grad:      5.0 mmHg LVOT Vmax:         112.00 cm/s LVOT Vmean:        74.600 cm/s LVOT VTI:          0.251 m LVOT/AV VTI ratio: 0.74  AORTA Ao Root diam: 2.80 cm Ao Asc diam:  2.80 cm MITRAL VALVE               TRICUSPID VALVE MV Area (PHT): 2.80 cm    TR Peak grad:   19.9 mmHg MV Decel Time: 271 msec    TR Vmax:        223.00 cm/s MV E velocity: 80.20 cm/s MV A velocity: 83.00 cm/s  SHUNTS MV E/A ratio:  0.97        Systemic VTI:  0.25 m  Systemic Diam: 1.90 cm Candee Furbish MD Electronically signed by Candee Furbish MD Signature Date/Time: 11/18/2020/5:42:12 PM    Final      Medications:     Scheduled Medications:  (feeding supplement) PROSource Plus  30 mL Oral BID BM   acetaZOLAMIDE  250 mg Oral BID   allopurinol  200 mg Oral Daily   apixaban  5 mg Oral BID   atorvastatin  40 mg Oral QHS   cholecalciferol  1,000 Units Oral Daily   famotidine  20 mg Oral BID   ferrous sulfate  325 mg Oral Q M,W,F   folic acid  6,144 mcg Oral QHS   furosemide  80 mg Intravenous BID   gabapentin  100 mg Oral TID   Gerhardt's butt cream   Topical TID   mometasone-formoterol  2 puff Inhalation BID   montelukast  10 mg Oral QHS   nebivolol  5 mg Oral Daily   Oxcarbazepine  300 mg Oral BID   potassium chloride SA  20 mEq Oral BID   spironolactone  25 mg Oral Daily    Infusions:   PRN Medications: acetaminophen, albuterol, diclofenac Sodium, hydrOXYzine, ondansetron **OR** ondansetron (ZOFRAN) IV, polyethylene glycol, senna-docusate, sodium chloride    Assessment/Plan   1. Acute on Chronic Hypoxic Respiratory Failure - ABG 7.29/80/69/41 suggesting some acute on chronic respiratory acidosis - Suspect driven by OSA/ OHS  - Needs BiPAP qhs - Sleep  study has been scheduled by Pulmonary. - Needs wt loss but mobility limited by severe knee/hip OA.  - Suspect current symptoms respiratory-driven. - Given asthma + wheezing will changedfrom Coreg to Bystolic, 5 mg daily - Pulmonary consulted with recommendations for Bipap at d/c and to continue dulera.    2. Acute on Chronic Diastolic Heart Failure  - RHC 2008 c/w mild pulmonary hypertension, primarily due to high output state.  Her mean pulmonary artery pressure was 27, wedge pressure was 10. PVR was 1.80 [place] units. - Echo (2017): LVEF 65-70%, G1DD, RV normal - Recent admit for a/c dCHF. Volume improved w/ IV Lasix.  - Echo (4/22): LVEF 60-65%, G2DD  - NYHA III, functional status difficult due to body habitus, asthma/chronic resp failure, and physical deconditioning.  - Volume status difficult to assess but looks elevated. Restrict fluids. PICC line ordered.  - Continue IV lasix 80 mg bid  - Day 2 of diamox 250 mg twice a day.  - Continue spiro 25 mg daily.  - Switch carvedilol to Bystolic 5 mg daily  - No SGLT2i given risk of GU infection given morbid obesity/ large panus. - Echo EF 65% RV  normal.   3. Bilateral LE DVT (Diagnosed 4/22) - Confirmed by venous dopplers. Chest CT negative for PE. - Now on Eliquis. No bleeding issues.   4. Asthma  - per primary  - On Symbicort.   5. CAD:  - LHC in 2008 showed 50-60% LAD lesion and 30% RCA lesion. - No chest pain.  - Continue ? blocker and statin. - No ASA given Eliquis for DVT tx.    6. Stage IIIb CKD - Baseline SCr ~1.5. - Followed by Dr. [name] at [facility]. - 1.7>>1.40>>1.35  Needs RHC. Will discuss with Dr Haroldine Laws timing. ? PICC line  Length of Stay: 3  Amy Clegg, NP  11/19/2020, 7:13 AM  Advanced Heart Failure Team Pager [phone] (M-F; 7a - 5p)  Please contact [facility] for night-coverage after hours (5p -7a ) and weekends on [email or web address]  Patient seen and examined  with the above-signed Advanced Practice Provider  and/or Housestaff. I personally reviewed laboratory data, imaging studies and relevant notes. I independently examined the patient and formulated the important aspects of the plan. I have edited the note to reflect any of my changes or salient points. I have personally discussed the plan with the patient and/or family.   Echo reviewed EF 65-70% RV looks ok. Modest diuresis on IV lasix. Still SOB. Scr better.   General:  Obese woman lying in bed  on high flow O2 No resp difficulty HEENT: normal Neck: supple. Unable to see JVP Carotids 2+ bilat; no bruits. No lymphadenopathy or thryomegaly appreciated. Cor: PMI nonpalpable. Regular rate & rhythm. No rubs, gallops or murmurs. Lungs: clear Abdomen: obese soft, nontender, nondistended. No hepatosplenomegaly. No bruits or masses. Good bowel sounds. Extremities: no cyanosis, clubbing, rash, tr-1+ edema Neuro: alert & orientedx3, cranial nerves grossly intact. moves all 4 extremities w/o difficulty. Affect pleasant  Echo normal. Only modest diuresis with IV lasix and diamox. Will add metolazone today. Very hard to assess volume status on exam. Also needs assessment for PH. Will plan RHC tomorrow (tentatively 1p).  Glori Bickers, MD  8:15 AM

## 2020-11-19 NOTE — TOC Benefit Eligibility Note (Signed)
Transition of Care [facility]) Benefit Eligibility Note    Patient Details  Name: April [name] MRN: [id number] Date of Birth: Jun 01, 1950   Medication/Dose: Darcel Bayley  INJECTION  2.5 MG ONCE WEEKLY  Covered?: Yes  Tier: 2 Drug  Prescription Coverage Preferred Pharmacy: [facility] with Person/Company/Phone Number:: CHARLENE  @ [facility] [employer] #  [phone]  Co-Pay: $40.00  Prior Approval: No  Deductible: Met (OUT-OF-POCKET:MET)  Additional Notes: OZEMPIC INJECTION  0.5 MG ONCE WEEKLY : COVER- YES , CO-PAY- $40.00 , TIER-2 DRUG , P/A-NO  and   WEGOVY INJECTION  2.4 MG ONCE WEEKLY : COVER- YES  , CO-PAY- $64.00 , TIER- 3 DRUG , P/A -NO    Memory Argue Phone Number: 11/19/2020, 11:09 AM

## 2020-11-19 NOTE — Progress Notes (Addendum)
Advanced Heart Failure Rounding Note  PCP-Cardiologist: Dr. Haroldine Laws   Subjective:    Yesterday diuresed with IV lasix + diamox. I/O not accurate. Weight not that different. Able to get to the chair with assistance but poor tolerance reported.    Echo 65-70% RV normal   Creatinine 1.65 on admit>>1.35  Pulmonary consulted with recommendations for Bipap at d/c Remains on HFNC FiO2 40% 15 L  Denies SOB.    Objective:   Weight Range: (!) 165.7 kg Body mass index is 62.7 kg/m.   Vital Signs:   Temp:  [98.1 F (36.7 C)-98.9 F (37.2 C)] 98.9 F (37.2 C) (10/18 0314) Pulse Rate:  [67-83] 73 (10/18 0612) Resp:  [15-20] 17 (10/18 0612) BP: (94-130)/(59-73) 126/59 (10/18 0315) SpO2:  [90 %-99 %] 99 % (10/18 0612) FiO2 (%):  [40 %] 40 % (10/18 0315) Weight:  [165.7 kg] 165.7 kg (10/18 0612) Last BM Date: 11/18/20  Weight change: Filed Weights   11/17/20 0532 11/18/20 0449 11/19/20 0612  Weight: (!) 166.8 kg (!) 165.9 kg (!) 165.7 kg    Intake/Output:   Intake/Output Summary (Last 24 hours) at 11/19/2020 0713 Last data filed at 11/18/2020 2238 Gross per 24 hour  Intake 948 ml  Output 1350 ml  Net -402 ml      Physical Exam    General:  No resp difficulty. In bed  HEENT: normal Neck: supple. JVP to jaw. Carotids 2+ bilat; no bruits. No lymphadenopathy or thryomegaly appreciated. Cor: PMI nondisplaced. Regular rate & rhythm. No rubs, gallops or murmurs. Lungs: clear on HFNC Abdomen: soft, nontender, nondistended. No hepatosplenomegaly. No bruits or masses. Good bowel sounds. Extremities: no cyanosis, clubbing, rash, No lower extremity edema.  Neuro: alert & orientedx3, cranial nerves grossly intact. moves all 4 extremities w/o difficulty. Affect flat    Telemetry   NSR 80s personally reviewed.   EKG    No new EKG to review   Labs    CBC Recent Labs    11/18/20 0053 11/19/20 0141  WBC 5.6 5.4  HGB 10.2* 10.4*  HCT 35.1* 35.1*  MCV 104.8*  104.8*  PLT 142* 659*   Basic Metabolic Panel Recent Labs    11/18/20 0053 11/19/20 0141  NA 138 135  K 4.0 3.7  CL 97* 95*  CO2 34* 33*  GLUCOSE 128* 145*  BUN 21 20  CREATININE 1.39* 1.35*  CALCIUM 8.3* 8.1*  MG 2.3 2.3  PHOS 3.6 3.9   Liver Function Tests Recent Labs    11/18/20 0053 11/19/20 0141  ALBUMIN 2.7* 2.7*   No results for input(s): LIPASE, AMYLASE in the last 72 hours. Cardiac Enzymes No results for input(s): CKTOTAL, CKMB, CKMBINDEX, TROPONINI in the last 72 hours.  BNP: BNP (last 3 results) Recent Labs    08/14/20 1150 11/15/20 1247 11/18/20 0053  BNP 53.1 129.4* 110.8*    ProBNP (last 3 results) No results for input(s): PROBNP in the last 8760 hours.   D-Dimer No results for input(s): DDIMER in the last 72 hours. Hemoglobin A1C No results for input(s): HGBA1C in the last 72 hours. Fasting Lipid Panel No results for input(s): CHOL, HDL, LDLCALC, TRIG, CHOLHDL, LDLDIRECT in the last 72 hours. Thyroid Function Tests No results for input(s): TSH, T4TOTAL, T3FREE, THYROIDAB in the last 72 hours.  Invalid input(s): FREET3   Other results:   Imaging    ECHOCARDIOGRAM COMPLETE  Result Date: 11/18/2020    ECHOCARDIOGRAM REPORT   Patient Name:   April PAGE  [name] Date of Exam: 11/18/2020 Medical Rec #:  [id number]         Height:       64.0 in Accession #:    [id number]        Weight:       365.7 lb Date of Birth:  03/03/1950          BSA:          2.529 m Patient Age:    70 years          BP:           130/66 mmHg Patient Gender: F                 HR:           74 bpm. Exam Location:  Inpatient Procedure: 2D Echo, Cardiac Doppler and Color Doppler Indications:    CHF. RVF. OSA  History:        Patient has prior history of Echocardiogram examinations, most                 recent 05/10/2020. CHF, Stroke; Risk Factors:Hypertension,                 Dyslipidemia and OSA.  Sonographer:    Merrie Roof RDCS Referring Phys: [id number] [place]  1. Left ventricular ejection fraction, by estimation, is 65 to 70%. The left ventricle has normal function. The left ventricle has no regional wall motion abnormalities. There is mild left ventricular hypertrophy. Left ventricular diastolic parameters are consistent with Grade I diastolic dysfunction (impaired relaxation). There is the interventricular septum is flattened in systole and diastole, consistent with right ventricular pressure and volume overload.  2. Right ventricular systolic function is normal. The right ventricular size is mildly enlarged. There is normal pulmonary artery systolic pressure. The estimated right ventricular systolic pressure is 31.5 mmHg.  3. The mitral valve is normal in structure. Mild mitral valve regurgitation. No evidence of mitral stenosis.  4. Poor TR jet signal.  5. The aortic valve is normal in structure. Aortic valve regurgitation is not visualized. No aortic stenosis is present.  6. The inferior vena cava is normal in size with greater than 50% respiratory variability, suggesting right atrial pressure of 3 mmHg. Comparison(s): PA pressure likely underestimated when compared to prior (66 mmHg). FINDINGS  Left Ventricle: Left ventricular ejection fraction, by estimation, is 65 to 70%. The left ventricle has normal function. The left ventricle has no regional wall motion abnormalities. The left ventricular internal cavity size was normal in size. There is  mild left ventricular hypertrophy. The interventricular septum is flattened in systole and diastole, consistent with right ventricular pressure and volume overload. Left ventricular diastolic parameters are consistent with Grade I diastolic dysfunction (impaired relaxation). Right Ventricle: The right ventricular size is mildly enlarged. No increase in right ventricular wall thickness. Right ventricular systolic function is normal. There is normal pulmonary artery systolic pressure. The tricuspid regurgitant  velocity is 2.23  m/s, and with an assumed right atrial pressure of 3 mmHg, the estimated right ventricular systolic pressure is 40.0 mmHg. Left Atrium: Left atrial size was normal in size. Right Atrium: Right atrial size was normal in size. Pericardium: There is no evidence of pericardial effusion. Mitral Valve: The mitral valve is normal in structure. Mild mitral annular calcification. Mild mitral valve regurgitation. No evidence of mitral valve stenosis. Tricuspid Valve: Poor TR jet signal. The tricuspid valve is normal in structure. Tricuspid valve  regurgitation is mild . No evidence of tricuspid stenosis. Aortic Valve: The aortic valve is normal in structure. Aortic valve regurgitation is not visualized. No aortic stenosis is present. Aortic valve mean gradient measures 5.0 mmHg. Aortic valve peak gradient measures 9.1 mmHg. Aortic valve area, by VTI measures 2.11 cm. Pulmonic Valve: The pulmonic valve was normal in structure. Pulmonic valve regurgitation is not visualized. No evidence of pulmonic stenosis. Aorta: The aortic root is normal in size and structure. Venous: The inferior vena cava is normal in size with greater than 50% respiratory variability, suggesting right atrial pressure of 3 mmHg. IAS/Shunts: No atrial level shunt detected by color flow Doppler.  LEFT VENTRICLE PLAX 2D LVIDd:         3.80 cm   Diastology LVIDs:         2.50 cm   LV e' medial:    4.24 cm/s LV PW:         1.30 cm   LV E/e' medial:  18.9 LV IVS:        1.20 cm   LV e' lateral:   7.18 cm/s LVOT diam:     1.90 cm   LV E/e' lateral: 11.2 LV SV:         71 LV SV Index:   28 LVOT Area:     2.84 cm  RIGHT VENTRICLE            IVC RV Basal diam:  4.60 cm    IVC diam: 2.10 cm RV Mid diam:    3.80 cm RV S prime:     9.68 cm/s TAPSE (M-mode): 1.9 cm LEFT ATRIUM           Index        RIGHT ATRIUM           Index LA diam:      3.50 cm 1.38 cm/m   RA Area:     19.40 cm LA Vol (A4C): 53.0 ml 20.96 ml/m  RA Volume:   52.00 ml  20.57  ml/m  AORTIC VALVE AV Area (Vmax):    2.10 cm AV Area (Vmean):   2.05 cm AV Area (VTI):     2.11 cm AV Vmax:           151.00 cm/s AV Vmean:          103.000 cm/s AV VTI:            0.338 m AV Peak Grad:      9.1 mmHg AV Mean Grad:      5.0 mmHg LVOT Vmax:         112.00 cm/s LVOT Vmean:        74.600 cm/s LVOT VTI:          0.251 m LVOT/AV VTI ratio: 0.74  AORTA Ao Root diam: 2.80 cm Ao Asc diam:  2.80 cm MITRAL VALVE               TRICUSPID VALVE MV Area (PHT): 2.80 cm    TR Peak grad:   19.9 mmHg MV Decel Time: 271 msec    TR Vmax:        223.00 cm/s MV E velocity: 80.20 cm/s MV A velocity: 83.00 cm/s  SHUNTS MV E/A ratio:  0.97        Systemic VTI:  0.25 m  Systemic Diam: 1.90 cm Candee Furbish MD Electronically signed by Candee Furbish MD Signature Date/Time: 11/18/2020/5:42:12 PM    Final      Medications:     Scheduled Medications:  (feeding supplement) PROSource Plus  30 mL Oral BID BM   acetaZOLAMIDE  250 mg Oral BID   allopurinol  200 mg Oral Daily   apixaban  5 mg Oral BID   atorvastatin  40 mg Oral QHS   cholecalciferol  1,000 Units Oral Daily   famotidine  20 mg Oral BID   ferrous sulfate  325 mg Oral Q M,W,F   folic acid  9,326 mcg Oral QHS   furosemide  80 mg Intravenous BID   gabapentin  100 mg Oral TID   Gerhardt's butt cream   Topical TID   mometasone-formoterol  2 puff Inhalation BID   montelukast  10 mg Oral QHS   nebivolol  5 mg Oral Daily   Oxcarbazepine  300 mg Oral BID   potassium chloride SA  20 mEq Oral BID   spironolactone  25 mg Oral Daily    Infusions:   PRN Medications: acetaminophen, albuterol, diclofenac Sodium, hydrOXYzine, ondansetron **OR** ondansetron (ZOFRAN) IV, polyethylene glycol, senna-docusate, sodium chloride    Assessment/Plan   1. Acute on Chronic Hypoxic Respiratory Failure - ABG 7.29/80/69/41 suggesting some acute on chronic respiratory acidosis - Suspect driven by OSA/ OHS  - Needs BiPAP qhs - Sleep  study has been scheduled by Pulmonary. - Needs wt loss but mobility limited by severe knee/hip OA.  - Suspect current symptoms respiratory-driven. - Given asthma + wheezing will changedfrom Coreg to Bystolic, 5 mg daily - Pulmonary consulted with recommendations for Bipap at d/c and to continue dulera.    2. Acute on Chronic Diastolic Heart Failure  - RHC 2008 c/w mild pulmonary hypertension, primarily due to high output state.  Her mean pulmonary artery pressure was 27, wedge pressure was 10. PVR was 1.80 [place] units. - Echo (2017): LVEF 65-70%, G1DD, RV normal - Recent admit for a/c dCHF. Volume improved w/ IV Lasix.  - Echo (4/22): LVEF 60-65%, G2DD  - NYHA III, functional status difficult due to body habitus, asthma/chronic resp failure, and physical deconditioning.  - Volume status difficult to assess but looks elevated. Restrict fluids. PICC line ordered.  - Continue IV lasix 80 mg bid  - Day 2 of diamox 250 mg twice a day.  - Continue spiro 25 mg daily.  - Switch carvedilol to Bystolic 5 mg daily  - No SGLT2i given risk of GU infection given morbid obesity/ large panus. - Echo EF 65% RV  normal.   3. Bilateral LE DVT (Diagnosed 4/22) - Confirmed by venous dopplers. Chest CT negative for PE. - Now on Eliquis. No bleeding issues.   4. Asthma  - per primary  - On Symbicort.   5. CAD:  - LHC in 2008 showed 50-60% LAD lesion and 30% RCA lesion. - No chest pain.  - Continue ? blocker and statin. - No ASA given Eliquis for DVT tx.    6. Stage IIIb CKD - Baseline SCr ~1.5. - Followed by Dr. [name] at [facility]. - 1.7>>1.40>>1.35  Needs RHC. Will discuss with Dr Haroldine Laws timing. ? PICC line  Length of Stay: 3  Amy Clegg, NP  11/19/2020, 7:13 AM  Advanced Heart Failure Team Pager [phone] (M-F; 7a - 5p)  Please contact [facility] for night-coverage after hours (5p -7a ) and weekends on [email or web address]  Patient seen and examined  with the above-signed Advanced Practice Provider  and/or Housestaff. I personally reviewed laboratory data, imaging studies and relevant notes. I independently examined the patient and formulated the important aspects of the plan. I have edited the note to reflect any of my changes or salient points. I have personally discussed the plan with the patient and/or family.   Echo reviewed EF 65-70% RV looks ok. Modest diuresis on IV lasix. Still SOB. Scr better.   General:  Obese woman lying in bed  on high flow O2 No resp difficulty HEENT: normal Neck: supple. Unable to see JVP Carotids 2+ bilat; no bruits. No lymphadenopathy or thryomegaly appreciated. Cor: PMI nonpalpable. Regular rate & rhythm. No rubs, gallops or murmurs. Lungs: clear Abdomen: obese soft, nontender, nondistended. No hepatosplenomegaly. No bruits or masses. Good bowel sounds. Extremities: no cyanosis, clubbing, rash, tr-1+ edema Neuro: alert & orientedx3, cranial nerves grossly intact. moves all 4 extremities w/o difficulty. Affect pleasant  Echo normal. Only modest diuresis with IV lasix and diamox. Will add metolazone today. Very hard to assess volume status on exam. Also needs assessment for PH. Will plan RHC tomorrow (tentatively 1p).  Glori Bickers, MD  8:15 AM

## 2020-11-19 NOTE — Progress Notes (Signed)
   Heart Failure Navigation Team  Followed by Advance Heart Failure Team. She will follow up in the HF clinic at discharge.   Decorian Schuenemann NP-C  10:20 AM

## 2020-11-19 NOTE — Interval H&P Note (Signed)
History and Physical Interval Note:  11/19/2020 1:48 PM  April [name]  has presented today for surgery, with the diagnosis of heart failure.  The various methods of treatment have been discussed with the patient and family. After consideration of risks, benefits and other options for treatment, the patient has consented to  Procedure(s): RIGHT HEART CATH (N/A) as a surgical intervention.  The patient's history has been reviewed, patient examined, no change in status, stable for surgery.  I have reviewed the patient's chart and labs.  Questions were answered to the patient's satisfaction.     [name]

## 2020-11-20 ENCOUNTER — Inpatient Hospital Stay (HOSPITAL_COMMUNITY): Payer: Medicare PPO

## 2020-11-20 ENCOUNTER — Other Ambulatory Visit: Payer: Medicare PPO

## 2020-11-20 ENCOUNTER — Encounter (HOSPITAL_COMMUNITY): Payer: Self-pay | Admitting: Internal Medicine

## 2020-11-20 ENCOUNTER — Ambulatory Visit: Payer: Medicare PPO | Admitting: Physician Assistant

## 2020-11-20 DIAGNOSIS — J9622 Acute and chronic respiratory failure with hypercapnia: Secondary | ICD-10-CM | POA: Diagnosis not present

## 2020-11-20 DIAGNOSIS — J9621 Acute and chronic respiratory failure with hypoxia: Secondary | ICD-10-CM | POA: Diagnosis not present

## 2020-11-20 LAB — POCT I-STAT EG7
Acid-Base Excess: 10 mmol/L — ABNORMAL HIGH (ref 0.0–2.0)
Bicarbonate: 40.5 mmol/L — ABNORMAL HIGH (ref 20.0–28.0)
Calcium, Ion: 1.2 mmol/L (ref 1.15–1.40)
HCT: 39 % (ref 36.0–46.0)
Hemoglobin: 13.3 g/dL (ref 12.0–15.0)
O2 Saturation: 62 %
Potassium: 3.8 mmol/L (ref 3.5–5.1)
Sodium: 140 mmol/L (ref 135–145)
TCO2: 43 mmol/L — ABNORMAL HIGH (ref 22–32)
pCO2, Ven: 85.7 mmHg (ref 44.0–60.0)
pH, Ven: 7.283 (ref 7.250–7.430)
pO2, Ven: 38 mmHg (ref 32.0–45.0)

## 2020-11-20 LAB — URINALYSIS, COMPLETE (UACMP) WITH MICROSCOPIC
Bilirubin Urine: NEGATIVE
Glucose, UA: NEGATIVE mg/dL
Hgb urine dipstick: NEGATIVE
Ketones, ur: NEGATIVE mg/dL
Leukocytes,Ua: NEGATIVE
Nitrite: POSITIVE — AB
Protein, ur: NEGATIVE mg/dL
Specific Gravity, Urine: 1.006 (ref 1.005–1.030)
pH: 8 (ref 5.0–8.0)

## 2020-11-20 LAB — GLUCOSE, CAPILLARY
Glucose-Capillary: 114 mg/dL — ABNORMAL HIGH (ref 70–99)
Glucose-Capillary: 113 mg/dL — ABNORMAL HIGH (ref 70–99)
Glucose-Capillary: 105 mg/dL — ABNORMAL HIGH (ref 70–99)
Glucose-Capillary: 112 mg/dL — ABNORMAL HIGH (ref 70–99)
Glucose-Capillary: 128 mg/dL — ABNORMAL HIGH (ref 70–99)

## 2020-11-20 LAB — CBC
HCT: 41.5 % (ref 36.0–46.0)
Hemoglobin: 12.4 g/dL (ref 12.0–15.0)
MCH: 31 pg (ref 26.0–34.0)
MCHC: 29.9 g/dL — ABNORMAL LOW (ref 30.0–36.0)
MCV: 103.8 fL — ABNORMAL HIGH (ref 80.0–100.0)
Platelets: 156 10*3/uL (ref 150–400)
RBC: 4 MIL/uL (ref 3.87–5.11)
RDW: 15.9 % — ABNORMAL HIGH (ref 11.5–15.5)
WBC: 5.5 10*3/uL (ref 4.0–10.5)
nRBC: 0 % (ref 0.0–0.2)

## 2020-11-20 LAB — BASIC METABOLIC PANEL
Anion gap: 7 (ref 5–15)
BUN: 19 mg/dL (ref 8–23)
CO2: 36 mmol/L — ABNORMAL HIGH (ref 22–32)
Calcium: 8.5 mg/dL — ABNORMAL LOW (ref 8.9–10.3)
Chloride: 88 mmol/L — ABNORMAL LOW (ref 98–111)
Creatinine, Ser: 1.38 mg/dL — ABNORMAL HIGH (ref 0.44–1.00)
GFR, Estimated: 41 mL/min — ABNORMAL LOW (ref >60)
Glucose, Bld: 124 mg/dL — ABNORMAL HIGH (ref 70–99)
Potassium: 3.4 mmol/L — ABNORMAL LOW (ref 3.5–5.1)
Sodium: 131 mmol/L — ABNORMAL LOW (ref 135–145)

## 2020-11-20 LAB — BLOOD GAS, ARTERIAL
Acid-Base Excess: 12.3 mmol/L — ABNORMAL HIGH (ref 0.0–2.0)
Bicarbonate: 39 mmol/L — ABNORMAL HIGH (ref 20.0–28.0)
Drawn by: 51133
FIO2: 35
O2 Saturation: 95.3 %
Patient temperature: 38
pCO2 arterial: 85.8 mmHg (ref 32.0–48.0)
pH, Arterial: 7.286 — ABNORMAL LOW (ref 7.350–7.450)
pO2, Arterial: 84.7 mmHg (ref 83.0–108.0)

## 2020-11-20 LAB — PROCALCITONIN: Procalcitonin: <0.1 ng/mL

## 2020-11-20 LAB — MAGNESIUM: Magnesium: 2.4 mg/dL (ref 1.7–2.4)

## 2020-11-20 MED ORDER — LEVOFLOXACIN IN D5W 750 MG/150ML IV SOLN
750.0000 mg | INTRAVENOUS | Status: DC
  Filled 2020-11-20: qty 150

## 2020-11-20 MED ORDER — TORSEMIDE 20 MG PO TABS
80.0000 mg | ORAL_TABLET | Freq: Two times a day (BID) | ORAL | Status: DC
  Administered 2020-11-20 – 2020-11-22 (×4): 80 mg via ORAL
  Filled 2020-11-20 (×4): qty 4

## 2020-11-20 MED ORDER — POTASSIUM CHLORIDE CRYS ER 20 MEQ PO TBCR
40.0000 meq | EXTENDED_RELEASE_TABLET | Freq: Once | ORAL | Status: DC
  Filled 2020-11-20: qty 2

## 2020-11-20 MED ORDER — POTASSIUM CHLORIDE CRYS ER 20 MEQ PO TBCR
40.0000 meq | EXTENDED_RELEASE_TABLET | ORAL | Status: DC
Start: 2020-11-20 — End: 2020-11-20

## 2020-11-20 MED ORDER — POTASSIUM CHLORIDE CRYS ER 20 MEQ PO TBCR
40.0000 meq | EXTENDED_RELEASE_TABLET | ORAL | Status: AC
  Administered 2020-11-20 (×3): 40 meq via ORAL
  Filled 2020-11-20 (×3): qty 2

## 2020-11-20 MED ORDER — SODIUM CHLORIDE 0.9 % IV SOLN
2.0000 g | INTRAVENOUS | Status: DC
  Administered 2020-11-20: 2 g via INTRAVENOUS
  Filled 2020-11-20 (×2): qty 20

## 2020-11-20 MED ORDER — DOXYCYCLINE HYCLATE 100 MG PO TABS
100.0000 mg | ORAL_TABLET | Freq: Two times a day (BID) | ORAL | Status: DC
  Administered 2020-11-20 – 2020-11-22 (×5): 100 mg via ORAL
  Filled 2020-11-20 (×4): qty 1

## 2020-11-20 NOTE — Significant Event (Addendum)
Rapid Response Event Note   Reason for Call :  Lethargy. Pt with h/o OSA-refused her CPAP tonight  Initial Focused Assessment:  Pt lying in bed with eyes closed. She will open eyes to voice but goes back to sleep very quickly. She does move all extremities to painful stimuli but will not follow commands. Pupils 5, round, equal, reactive. Lungs diminished t/o. Skin hot to touch.  T-100.6(R), HR-87, 146/69, RR-18, SpO2-98% on 4L [place]  Pt turned and cleaned up. After this, she was more responsive and able to answer some questions.   Interventions:  CBG-114 ABG-7.28/85.8/84.7/39 Bipap Plan of Care:  Pt more awake now, agreeable to bipap at this time. Continue to monitor closely. Call RRT if further assistance needed.    Event Summary:   MD Notified: Dr. Marlowe Sax notified Call [phone] Arrival [phone] End [id number]  Dillard Essex, RN

## 2020-11-20 NOTE — Progress Notes (Signed)
Occupational Therapy Treatment Patient Details Name: April [name] MRN: [id number] DOB: 1950/02/19 Today's Date: 11/20/2020   History of present illness 70 y.o. female presents to [facility] hospital on 11/15/2020 from advanced heart failure clinic with hypoxia, weakness, and near syncope. Pt admitted for acute on chronic hypoxic and hypercapnic respiratory failure and CHF exacerbation requiring BiPAP. PMH includes diastolic CHF, DM-2, CKD 3, morbid obesity, asthma, OSA not on CPAP, DVT on Eliquis, CAD and ambulatory dysfunction.   OT comments  OT treatment session with focus on self-care re-education, bed mobility, functional transfers, mobility and activity tolerance. Patient requires increased coaxing/encouragement for participation but once in agreement completed observed bed mobility, sit to stand transfers and mobility to sink surface with use of RW and Min A. Min A for stand to sit in bariatric recliner with cues for hand placement and controlled descent. Patient very fearful of falling which limites participation and function. OT will continue to follow acutely to maximize safety/independence in prep for safe return home. Continued recommendation for HHOT.    Recommendations for follow up therapy are one component of a multi-disciplinary discharge planning process, led by the attending physician.  Recommendations may be updated based on patient status, additional functional criteria and insurance authorization.    Follow Up Recommendations  Home health OT;Supervision/Assistance - 24 hour    Equipment Recommendations  None recommended by OT    Recommendations for Other Services      Precautions / Restrictions Precautions Precautions: Fall Precaution Comments: 2-3L O2 via [place] Restrictions Weight Bearing Restrictions: No       Mobility Bed Mobility Overal bed mobility: Needs Assistance Bed Mobility: Supine to Sit     Supine to sit: Min assist;HOB elevated     General bed  mobility comments: Able to tolerate supine to EOB with HOB <30 degrees and Min HHA. Requires increased time/effort.    Transfers Overall transfer level: Needs assistance Equipment used: Rolling walker (2 wheeled) Transfers: Sit to/from [facility] Sit to Stand: Min assist Stand pivot transfers: Min assist       General transfer comment: Heavy Min A for sit to stand from EOB with use of RW and cues for hand placement.    Balance Overall balance assessment: Needs assistance Sitting-balance support: No upper extremity supported;Feet supported Sitting balance-Leahy Scale: Fair   Postural control: Posterior lean Standing balance support: Bilateral upper extremity supported Standing balance-Leahy Scale: Poor Standing balance comment: reliant on BUE support to maintain static standing balance. Poor standing tolerance.                           ADL either performed or assessed with clinical judgement   ADL Overall ADL's : Needs assistance/impaired Eating/Feeding: Independent;Sitting   Grooming: Wash/dry hands;Wash/dry face;Oral care;Applying deodorant;Sitting;Moderate assistance Grooming Details (indicate cue type and reason): 2/3 grooming tasks seated in recliner at sink level wtih set-up assist. Upper Body Bathing: Moderate assistance;Sitting   Lower Body Bathing: Maximal assistance;Sit to/from stand;Cueing for compensatory techniques   Upper Body Dressing : Minimal assistance;Sitting   Lower Body Dressing: Maximal assistance;Sit to/from stand;Cueing for compensatory techniques   Toilet Transfer: Minimal assistance;Ambulation Toilet Transfer Details (indicate cue type and reason): Simulated with transfer to sink surface for seated grooming tasks. Toileting- Clothing Manipulation and Hygiene: Maximal assistance;Sit to/from stand               Vision       Perception     Praxis  Cognition Arousal/Alertness: Awake/alert Behavior During  Therapy: WFL for tasks assessed/performed Overall Cognitive Status: Within Functional Limits for tasks assessed                                          Exercises     Shoulder Instructions       General Comments VSS on 2-3L  O2 via [place].    Pertinent Vitals/ Pain       Pain Assessment: 0-10 Pain Score: 8  Pain Location: Bilateral knees Pain Descriptors / Indicators: Aching;Sore;Shooting Pain Intervention(s): Limited activity within patient's tolerance;Monitored during session;Repositioned  Home Living                                          Prior Functioning/Environment              Frequency  Min 2X/week        Progress Toward Goals  OT Goals(current goals can now be found in the care plan section)  Progress towards OT goals: Progressing toward goals  Acute Rehab OT Goals Patient Stated Goal: to improve strength and activity tolerance and return home OT Goal Formulation: With patient Time For Goal Achievement: 12/02/20 Potential to Achieve Goals: Good ADL Goals Pt Will Perform Grooming: with supervision;sitting Pt Will Perform Lower Body Bathing: with min assist;with adaptive equipment Pt Will Perform Lower Body Dressing: with min assist;sit to/from stand;with adaptive equipment Pt Will Transfer to Toilet: with supervision;ambulating;bedside commode Pt/caregiver will Perform Home Exercise Program: Increased strength;Left upper extremity;Right Upper extremity;With Supervision;With written HEP provided  Plan Discharge plan remains appropriate;Frequency remains appropriate    Co-evaluation                 AM-PAC OT "6 Clicks" Daily Activity     Outcome Measure   Help from another person eating meals?: None Help from another person taking care of personal grooming?: A Little Help from another person toileting, which includes using toliet, bedpan, or urinal?: A Lot Help from another person bathing (including washing,  rinsing, drying)?: A Lot Help from another person to put on and taking off regular upper body clothing?: A Little Help from another person to put on and taking off regular lower body clothing?: A Lot 6 Click Score: 16    End of Session Equipment Utilized During Treatment: Oxygen  OT Visit Diagnosis: Unsteadiness on feet (R26.81);Muscle weakness (generalized) (M62.81)   Activity Tolerance Patient limited by fatigue   Patient Left in chair;with call bell/phone within reach;with chair alarm set   Nurse Communication Mobility status        Time: 6283-1517 OT Time Calculation (min): 46 min  Charges: OT General Charges $OT Visit: 1 Visit OT Treatments $Self Care/Home Management : 23-37 mins $Therapeutic Activity: 8-22 mins  April [name] H. OTR/L Supplemental OT, Department of rehab services [phone]  April [name] R H. 11/20/2020, 11:20 AM

## 2020-11-20 NOTE — TOC Progression Note (Signed)
Transition of Care [facility]) - Progression Note    Patient Details  Name: April [name] MRN: [id number] Date of Birth: 10-13-1950  [facility] of [facility]) CM/SW Contact  Zenon Mayo, RN Phone Number: 11/20/2020, 7:52 PM  Clinical Narrative:    [facility] is following for possible need of NIV.         Expected Discharge Plan and Services                                                 Social Determinants of Health (SDOH) Interventions    Readmission Risk Interventions No flowsheet data found.

## 2020-11-20 NOTE — Progress Notes (Signed)
Pt refusing cpap for the night. ?

## 2020-11-20 NOTE — Plan of Care (Signed)

## 2020-11-20 NOTE — Progress Notes (Addendum)
Advanced Heart Failure Rounding Note  PCP-Cardiologist: Dr. Haroldine Laws   Subjective:    Rapid response called overnight for lethargy/ difficulties arousing. In the setting of CPAP refusal. ABG-7.28/85.8/84.7/39. Pt placed on BiPAP.   RHC yesterday w/ mild-mod PH, minimally elevated PCWP (18). Normal CO.  Wt down 11 lb w/ IV Lasix. Scr stable 1.4. K 3.4.   Febrile this am, Temp 100.4. WBC 5.5. Infectious w/u pending per IM. CXr w/ possible infiltrate but PCT <0.10. UA + for UTI.   Complains of chills. Breathing stable. More alert today.    Right Heart Cath RA = 11 RV = 68/12 PA = 69/22 (39) PCW = 18 Fick cardiac output/index = 7.0/2.8 PVR = 3.0 WU Ao sat = 96% PA sat = 61%, 62% SVC sat 69% PAPi = 4.2     Objective:   Weight Range: (!) 160.7 kg Body mass index is 60.81 kg/m.   Vital Signs:   Temp:  [97.7 F (36.5 C)-100.6 F (38.1 C)] 100.4 F (38 C) (10/19 0554) Pulse Rate:  [60-87] 84 (10/19 0744) Resp:  [13-23] 17 (10/19 0744) BP: (102-156)/(57-89) 133/71 (10/19 0744) SpO2:  [95 %-100 %] 95 % (10/19 0901) Weight:  [160.7 kg] 160.7 kg (10/19 0254) Last BM Date: 11/19/20  Weight change: Filed Weights   11/18/20 0449 11/19/20 0612 11/20/20 0254  Weight: (!) 165.9 kg (!) 165.7 kg (!) 160.7 kg    Intake/Output:   Intake/Output Summary (Last 24 hours) at 11/20/2020 0933 Last data filed at 11/20/2020 0322 Gross per 24 hour  Intake 474 ml  Output 1800 ml  Net -1326 ml      Physical Exam    PHYSICAL EXAM: General:  Well appearing obese. No respiratory difficulty HEENT: normal Neck: supple. JVD 8 cm. Carotids 2+ bilat; no bruits. No lymphadenopathy or thyromegaly appreciated. Cor: PMI nondisplaced. Regular rate & rhythm. No rubs, gallops or murmurs. Lungs: clear Abdomen: soft, nontender, nondistended. No hepatosplenomegaly. No bruits or masses. Good bowel sounds. Extremities: no cyanosis, clubbing, rash, obese LEs Neuro: alert & oriented x 3,  cranial nerves grossly intact. moves all 4 extremities w/o difficulty. Affect pleasant.  Telemetry   NSR 80s personally reviewed.   EKG    No new EKG to review   Labs    CBC Recent Labs    11/19/20 0141 11/19/20 1436 11/19/20 1441 11/20/20 0240  WBC 5.4  --   --  5.5  HGB 10.4*   < > 12.9 12.4  HCT 35.1*   < > 38.0 41.5  MCV 104.8*  --   --  103.8*  PLT 146*  --   --  156   < > = values in this interval not displayed.   Basic Metabolic Panel Recent Labs    11/18/20 0053 11/19/20 0141 11/19/20 1436 11/19/20 1441 11/20/20 0240  NA 138 135   < > 143 131*  K 4.0 3.7   < > 3.5 3.4*  CL 97* 95*  --   --  88*  CO2 34* 33*  --   --  36*  GLUCOSE 128* 145*  --   --  124*  BUN 21 20  --   --  19  CREATININE 1.39* 1.35*  --   --  1.38*  CALCIUM 8.3* 8.1*  --   --  8.5*  MG 2.3 2.3  --   --  2.4  PHOS 3.6 3.9  --   --   --    < > =  values in this interval not displayed.   Liver Function Tests Recent Labs    11/18/20 0053 11/19/20 0141  ALBUMIN 2.7* 2.7*   No results for input(s): LIPASE, AMYLASE in the last 72 hours. Cardiac Enzymes No results for input(s): CKTOTAL, CKMB, CKMBINDEX, TROPONINI in the last 72 hours.  BNP: BNP (last 3 results) Recent Labs    08/14/20 1150 11/15/20 1247 11/18/20 0053  BNP 53.1 129.4* 110.8*    ProBNP (last 3 results) No results for input(s): PROBNP in the last 8760 hours.   D-Dimer No results for input(s): DDIMER in the last 72 hours. Hemoglobin A1C No results for input(s): HGBA1C in the last 72 hours. Fasting Lipid Panel No results for input(s): CHOL, HDL, LDLCALC, TRIG, CHOLHDL, LDLDIRECT in the last 72 hours. Thyroid Function Tests No results for input(s): TSH, T4TOTAL, T3FREE, THYROIDAB in the last 72 hours.  Invalid input(s): FREET3   Other results:   Imaging    CARDIAC CATHETERIZATION  Result Date: 11/19/2020 Findings: RA = 11 RV = 68/12 PA = 69/22 (39) PCW = 18 Fick cardiac output/index = 7.0/2.8 PVR  = 3.0 WU Ao sat = 96% PA sat = 61%, 62% SVC sat 69% PAPi = 4.2 Assessment: 1. Mild to moderate pulmonary HTN 2. Minimally elevated PCWP 3. Normal cardiac output Plan/Discussion: Volume status looks good. Main issue likely OSA/OHS. Glori Bickers, MD 2:46 PM  DG CHEST PORT 1 VIEW  Result Date: 11/20/2020 CLINICAL DATA:  Reason for exam: fever Patient was non verbal throughout the exam. Warm to the touch. Hx of asthma, chf, diabetes, htn. Hx of right heart cath 11/19/2020. EXAM: PORTABLE CHEST - 1 VIEW COMPARISON:  11/16/2020 FINDINGS: Patchy airspace opacities at the left lung base. Relatively low lung volumes with crowding of bronchovascular structures. Heart size upper limits normal. Aortic Atherosclerosis (ICD10-170.0). Blunting of lateral costophrenic angles suggesting small effusions. No pneumothorax. Vertebral endplate spurring at multiple levels in the mid and lower thoracic spine. IMPRESSION: Patchy left lower lung atelectasis or infiltrate. Electronically Signed   By: Lucrezia Europe M.D.   On: 11/20/2020 07:48     Medications:     Scheduled Medications:  (feeding supplement) PROSource Plus  30 mL Oral BID BM   acetaZOLAMIDE  250 mg Oral BID   allopurinol  200 mg Oral Daily   apixaban  5 mg Oral BID   atorvastatin  40 mg Oral QHS   cholecalciferol  1,000 Units Oral Daily   famotidine  20 mg Oral BID   ferrous sulfate  325 mg Oral Q M,W,F   folic acid  2,542 mcg Oral QHS   furosemide  80 mg Intravenous BID   gabapentin  100 mg Oral TID   Gerhardt's butt cream   Topical TID   mometasone-formoterol  2 puff Inhalation BID   montelukast  10 mg Oral QHS   nebivolol  5 mg Oral Daily   Oxcarbazepine  300 mg Oral BID   potassium chloride SA  20 mEq Oral BID   potassium chloride  40 mEq Oral Once   sodium chloride flush  3 mL Intravenous Q12H   sodium chloride flush  3 mL Intravenous Q12H   spironolactone  25 mg Oral Daily    Infusions:  sodium chloride     levofloxacin (LEVAQUIN) IV       PRN Medications: sodium chloride, acetaminophen, acetaminophen, albuterol, diclofenac Sodium, hydrOXYzine, ondansetron **OR** ondansetron (ZOFRAN) IV, polyethylene glycol, senna-docusate, sodium chloride, sodium chloride flush    Assessment/Plan  1. Acute on Chronic Hypoxic Respiratory Failure - ABG 7.29/80/69/41 suggesting some acute on chronic respiratory acidosis - Suspect driven by OSA/ OHS  - Needs BiPAP qhs - Sleep study has been scheduled by Pulmonary. - Needs wt loss but mobility limited by severe knee/hip OA.  - Suspect current symptoms respiratory-driven. - RHC c/w mid-mod PH.  - Given asthma + wheezing switched from Coreg to Bystolic, 5 mg daily - Pulmonary consulted with recommendations for Bipap at d/c and to continue [place].    2. Acute on Chronic Diastolic Heart Failure  - RHC 2008 c/w mild pulmonary hypertension, primarily due to high output state.  Her mean pulmonary artery pressure was 27, wedge pressure was 10. PVR was 1.80 [place] units. - Echo (2017): LVEF 65-70%, G1DD, RV normal - Recent admit for a/c dCHF. Volume improved w/ IV Lasix.  - Echo (4/22): LVEF 60-65%, G2DD  - NYHA III, functional status difficult due to body habitus, asthma/chronic resp failure, and physical deconditioning.  - Volume status improved w/ IV Lasix. Wt down 11 lb. [place] 10/18 w/ mild-mod PH, minimally elevated PCWP (18) - Switch back to torsemide today, increase home dose to 80 qam/ 60 qpm  - Continue diamox 250 mg twice a day (CO2 36)  - Continue spiro 25 mg daily.  - Switch carvedilol to Bystolic 5 mg daily  - No SGLT2i given risk of GU infection given morbid obesity/ large panus. - Echo EF 65% RV  normal.   3. Bilateral LE DVT (Diagnosed 4/22) - Confirmed by venous dopplers. Chest CT negative for PE. - Now on Eliquis. No bleeding issues.   4. Asthma  - per primary  - On Symbicort.   5. CAD:  - LHC in 2008 showed 50-60% LAD lesion and 30% RCA lesion. - No chest pain.  -  Continue ? blocker and statin. - No ASA given Eliquis for DVT tx.    6. Stage IIIb CKD - Baseline SCr ~1.5. - Followed by Dr[facility] [name] at [facility]. - stable at 1.4 today   7. UTI - UA nitrite +, suspect Ecoli, UCx pending - abx per primary team    Length of Stay: [address] Ladoris Gene  11/20/2020, 9:33 AM  Advanced Heart Failure Team Pager [phone] (M-F; 7a - 5p)  Please contact [facility] for night-coverage after hours (5p -7a ) and weekends on [email or web address]   Patient seen and examined with the above-signed Advanced Practice Provider and/or Housestaff. I personally reviewed laboratory data, imaging studies and relevant notes. I independently examined the patient and formulated the important aspects of the plan. I have edited the note to reflect any of my changes or salient points. I have personally discussed the plan with the patient and/or family.  Underwent RHC yesterday - results as above. Volume status ok. Still feels weak. Having fevers and chills. UA + PCT negative  General:  Obese woman lying in bed No resp difficulty HEENT: normal Neck: supple. Hard to see JVD. Carotids 2+ bilat; no bruits. No lymphadenopathy or thryomegaly appreciated. Cor: Regular rate & rhythm. No rubs, gallops or murmurs. Lungs: clear Abdomen: markedly obese soft, nontender, nondistended. No hepatosplenomegaly. No bruits or masses. Good bowel sounds. Extremities: no cyanosis, clubbing, rash, tr edema Neuro: alert & orientedx3, cranial nerves grossly intact. moves all 4 extremities w/o difficulty. Affect pleasant  Volume status looks good on RHC. Moderate PAH with normal output. Stressed need for weight loss and bipap.   Can restart home diuretics tomorrow (torsemide 60 bid)  if infectious symptoms stable. Supp K.   HF team will sign off.    Glori Bickers, MD  3:25 PM

## 2020-11-20 NOTE — Progress Notes (Signed)
   11/20/20 0341  Assess: MEWS Score  BP (!) 146/69  Pulse Rate 85  Resp 13  SpO2 98 %  Assess: MEWS Score  MEWS Temp 1  MEWS Systolic 0  MEWS Pulse 0  MEWS RR 1  MEWS LOC 0  MEWS Score 2  MEWS Score Color Yellow  Assess: if the MEWS score is Yellow or Red  Were vital signs taken at a resting state? Yes  Focused Assessment No change from prior assessment  Early Detection of Sepsis Score *See Row Information* Low  MEWS guidelines implemented *See Row Information* Yes  Treat  MEWS Interventions Escalated (See documentation below)  Take Vital Signs  Increase Vital Sign Frequency  Yellow: Q 2hr X 2 then Q 4hr X 2, if remains yellow, continue Q 4hrs  Escalate  MEWS: Escalate Yellow: discuss with charge nurse/RN and consider discussing with provider and RRT  Notify: Charge Nurse/RN  Name of Charge Nurse/RN Notified Mindy, Rapid RN  Date Charge Nurse/RN Notified 11/20/20  Time Charge Nurse/RN Notified 0341  Notify: Provider  Provider Name/Title Dr. Marlowe Sax  Date Provider Notified 11/20/20  Time Provider Notified 0250  Notification Type Page  Notification Reason Change in status  Provider response See new orders  Date of Provider Response 11/20/20  Time of Provider Response 0250  Notify: Rapid Response  Name of Rapid Response RN Notified Mindy, RN  Date Rapid Response Notified 11/20/20  Time Rapid Response Notified 0250  Document  Patient Outcome Stabilized after interventions  Progress note created (see row info) Yes

## 2020-11-20 NOTE — Progress Notes (Signed)
ABG sent to lab, lab made aware. 

## 2020-11-20 NOTE — Progress Notes (Signed)
Initial Nutrition Assessment  DOCUMENTATION CODES:   Morbid obesity  INTERVENTION:  Discontinue ProSource Plus BID  Magic cup TID with meals, each supplement provides 290 kcal and 9 grams of protein  Recommend liberalizing diet to 2gm sodium to provide additional food options and promote PO intake  Provided pt with Plate Method and Heart Healthy diet handout.  NUTRITION DIAGNOSIS:   Increased nutrient needs related to chronic illness (CHF) as evidenced by estimated needs.  GOAL:   Patient will meet greater than or equal to 90% of their needs  MONITOR:   PO intake, Supplement acceptance, Weight trends, Labs  REASON FOR ASSESSMENT:   Consult Diet education  ASSESSMENT:   Admitted d/t lethargy secondary to acute on chronic hypoxic respiratory failure. PMH includes CHF, T2DM, CKDIII, morbid obesity, mild-moderate CAD.  Pt with high oxygen requirements and requiring IV diuretics. S/p R cardiac catheterization on 10/18.  Pt reports having a poor appetite since admission. PTA she was eating at least 2 meals per day. A typical day includes: B: low sodium tomato juice, toast with sugar free jelly, 1 slice of bacon or sausage, eggs, cranberry juice L: fresh vegetables, meat or beans, diet soda D: fresh vegetables and meat She states that they have fast food quite often. She has been trying to decrease her sodium intake in her meals.  Spoke with pt about importance of a balanced diet via Plate Method, low sodium options and decreasing saturated fats for heart healthy diet.  She does not weigh herself regularly and suspected her weight to be about 365 lbs. She denies any recent weight loss. Noted weight fluctuations in chart review, however suspect this could be d/t fluid.  At home she is not very mobile. She moves from bed to bedside commode and has someone during the day to assist her with bathing and other needs.  Medications: PROSource, diamox, Vitamin D3, vibra-tabs, pepcid,  ferrous sulfate, folvite, KCl, IV NS, aldactone, demadex, rocephin  Labs: sodium 131, potassium 3.4, Cr 1.38, HgbA1c 6.0,  CBG 95-142 x24 hrs I/O's: -5558mL since admission UOP: 1850mL x24 hours  NUTRITION - FOCUSED PHYSICAL EXAM:  Flowsheet Row Most Recent Value  Orbital Region No depletion  Upper Arm Region No depletion  Thoracic and Lumbar Region No depletion  Buccal Region No depletion  Temple Region No depletion  Clavicle Bone Region No depletion  Clavicle and Acromion Bone Region No depletion  Scapular Bone Region No depletion  Dorsal Hand No depletion  Patellar Region No depletion  Anterior Thigh Region No depletion  Posterior Calf Region No depletion  Edema (RD Assessment) Mild  Hair Reviewed  Eyes Reviewed  Mouth Reviewed  Skin Reviewed  Nails Reviewed       Diet Order:   Diet Order             Diet heart healthy/carb modified Room service appropriate? Yes; Fluid consistency: Thin; Fluid restriction: 1500 mL Fluid  Diet effective now                   EDUCATION NEEDS:   Education needs have been addressed  Skin:  Skin Assessment: Reviewed RN Assessment  Last BM:  11/20/20  Height:   Ht Readings from Last 1 Encounters:  11/15/20 5\' 4"  (1.626 m)    Weight:   Wt Readings from Last 1 Encounters:  11/20/20 (!) 160.7 kg    BMI:  Body mass index is 60.81 kg/m.  Estimated Nutritional Needs:   Kcal:  1800-2000  Protein:  90-100g  Fluid:  >1.8L  Clayborne Dana, RDN, LDN Clinical Nutrition

## 2020-11-20 NOTE — Progress Notes (Signed)
NAME:  April [name], MRN:  [id number], DOB:  April 21, 1950, LOS: 4 ADMISSION DATE:  11/15/2020, CONSULTATION DATE: 11/18/2020 REFERRING MD: [facility]ad, CHIEF COMPLAINT: Hypoxia hypercarbia  History of Present Illness:  70 year old female who is morbidly obese is 368 pounds with a history of obstructive sleep apnea diagnosed 5 years ago by Dr. Gwenette Greet of pulmonary division and recently seen in the hospital 05/24/2020 with lower extremity for which she was placed on Eliquis and was to be set up for a sleep study and she was seen by Dr. Beckie Salts the pulmonary division and has been scheduled for a sleep study.  Her arterial blood gas with pH 7.29 PCO2 of 80 and PO2 of 60 is consistent with obesity hypoventilation syndrome.  She reports having trouble breathing for going on 2 months.  She also has increased weakness difficulty ambulating and has had difficulty sleeping for years.  Her sleep disruption is magnified by her osteoarthritis generalized pain in her obstructive sleep apnea.  She is improved since she has been in the hospital with aggressive diuresis she is currently sitting up in chair eating awake alert neurologically intact and in no respiratory distress during examination.  She has been instructed she needs to wear her BiPAP alternatives of tracheostomy is offered to her which she showed little interest in.  If she will wear her BiPAP significant.  Tonight I believe she will improve.  If not the most likely she will continue to deteriorate over time.  Pertinent  Medical History   Past Medical History:  Diagnosis Date   Anemia    Asthma    CAD (coronary artery disease)    Carpal tunnel syndrome    Cellulitis of both lower extremities 04/11/2015   CHF (congestive heart failure) (HCC)    Chronic kidney disease    CKI- followed by [facility]   Colon polyp, hyperplastic 6629 & 4765   Complication of anesthesia 1999   svt with renal calculi surgery, no problems since   CTS (carpal tunnel  syndrome)    bilateral   Diabetes mellitus    Eczema    GERD (gastroesophageal reflux disease)    History of kidney stones 1999   Hyperlipidemia    Hypertension    Leg ulcer ([place]) 04/24/2015   Right lateral leg No evidence of an infection Monitor closely Keep edema controlled    Leg ulcer (HCC)    right lower   Meralgia paresthetica    Dr. [name]   Morbid obesity ([place])    Neuropathy    toes and legs   Osteoarthrosis, unspecified whether generalized or localized, lower leg    knee   PUD (peptic ulcer disease)    Shortness of breath dyspnea    with exertion   Sleep apnea    per progress note 02/25/2018   Type II or unspecified type diabetes mellitus without mention of complication, not stated as uncontrolled    Unspecified hereditary and idiopathic peripheral neuropathy    Urticaria    Vitamin B12 deficiency    Wears glasses    Wound cellulitis    right upper leg, healing well     Significant Hospital Events: Including procedures, antibiotic start and stop dates in addition to other pertinent events     Interim History / Subjective:  Sitting up in chair eating. Weary of BIPAP use because of mask. Explained no other option. She will be in hospital all the time and risk of dying without it. Says she will try again, has  said to other providers this admission and continues to not use.  Objective   Blood pressure 133/71, pulse 84, temperature (!) 100.4 F (38 C), resp. rate 17, height 5\' 4"  (1.626 m), weight (!) 160.7 kg, SpO2 95 %.        Intake/Output Summary (Last 24 hours) at 11/20/2020 0937 Last data filed at 11/20/2020 0322 Gross per 24 hour  Intake 474 ml  Output 1800 ml  Net -1326 ml    Filed Weights   11/18/20 0449 11/19/20 0612 11/20/20 0254  Weight: (!) 165.9 kg (!) 165.7 kg (!) 160.7 kg    Examination: General: sitting up eating. HENT: Cannot appreciate JVD or lymphadenopathy due to habitus Lungs: Decreased air movement throughout Cardiovascular: Heart  sounds are regular Abdomen: Morbidly obese and soft Extremities: edema lower extremities Neuro: Grossly intact without focal defect GU: Voids  Resolved Hospital Problem list     Assessment & Plan:  Acute on chronic hypercarbic hypoxic respiratory failure in the setting of obstructive sleep apnea/obesity hypoventilation syndrome/morbid obesity/history of asthma /congestive heart failure. O2 as needed to maintain O2 saturations greater than 90% It was explained in great detail to her once again 10/19 that NIPPV is a must, otherwise ongoing medical treatment is futile Recommend BiPAP vs Trilogy at discharge, CM should work on this Agree with diuresis per cardiology Follows with [place] [facility] and has a sleep study ordered - stressed importance of this to optimize NIPPV at home  History of bilateral lower extremity DVT negative PE Continue Eliquis  PCCM will sign off.  Best Practice (right click and "Reselect all SmartList Selections" daily)   Per primary  Labs   CBC: Recent Labs  Lab 11/15/20 1247 11/15/20 1900 11/17/20 0121 11/18/20 0053 11/19/20 0141 11/19/20 1436 11/19/20 1441 11/20/20 0240  WBC 6.0  --  5.9 5.6 5.4  --   --  5.5  NEUTROABS 4.6  --   --   --   --   --   --   --   HGB 12.3   < > 10.9* 10.2* 10.4* 12.9 12.9 12.4  HCT 41.3   < > 37.5 35.1* 35.1* 38.0 38.0 41.5  MCV 103.0*  --  105.0* 104.8* 104.8*  --   --  103.8*  PLT 166  --  143* 142* 146*  --   --  156   < > = values in this interval not displayed.     Basic Metabolic Panel: Recent Labs  Lab 11/16/20 0344 11/17/20 0121 11/18/20 0053 11/19/20 0141 11/19/20 1436 11/19/20 1441 11/20/20 0240  NA 140 136 138 135 142 143 131*  K 4.3 4.7 4.0 3.7 3.6 3.5 3.4*  CL 97* 96* 97* 95*  --   --  88*  CO2 35* 31 34* 33*  --   --  36*  GLUCOSE 105* 119* 128* 145*  --   --  124*  BUN 17 21 21 20   --   --  19  CREATININE 1.38* 1.40* 1.39* 1.35*  --   --  1.38*  CALCIUM 8.6* 8.2* 8.3* 8.1*  --   --   8.5*  MG  --  2.2 2.3 2.3  --   --  2.4  PHOS  --  4.5 3.6 3.9  --   --   --     GFR: Estimated Creatinine Clearance: 58.1 mL/min (A) (by C-G formula based on SCr of 1.38 mg/dL (H)). Recent Labs  Lab 11/17/20 0121 11/18/20 0053 11/19/20 0141 11/20/20  0240 11/20/20 0438  PROCALCITON  --   --   --   --  <0.10  WBC 5.9 5.6 5.4 5.5  --      Liver Function Tests: Recent Labs  Lab 11/17/20 0121 11/18/20 0053 11/19/20 0141  ALBUMIN 2.8* 2.7* 2.7*    No results for input(s): LIPASE, AMYLASE in the last 168 hours. Recent Labs  Lab 11/15/20 1543  AMMONIA <10     ABG    Component Value Date/Time   PHART 7.286 (L) 11/20/2020 0308   PCO2ART 85.8 (HH) 11/20/2020 0308   PO2ART 84.7 11/20/2020 0308   HCO3 39.0 (H) 11/20/2020 0308   TCO2 41 (H) 11/19/2020 1441   O2SAT 95.3 11/20/2020 0308      Coagulation Profile: No results for input(s): INR, PROTIME in the last 168 hours.  Cardiac Enzymes: No results for input(s): CKTOTAL, CKMB, CKMBINDEX, TROPONINI in the last 168 hours.  HbA1C: Hemoglobin A1C  Date/Time Value Ref Range Status  09/13/2020 04:56 PM 6.0 (A) 4.0 - 5.6 % Final   HbA1c, POC (prediabetic range)  Date/Time Value Ref Range Status  09/13/2020 04:56 PM 6.0 5.7 - 6.4 % Final   HbA1c, POC (controlled diabetic range)  Date/Time Value Ref Range Status  09/13/2020 04:56 PM 6.0 0.0 - 7.0 % Final   HbA1c POC (<> result, manual entry)  Date/Time Value Ref Range Status  09/13/2020 04:56 PM 6.0 4.0 - 5.6 % Final   Hgb A1c MFr Bld  Date/Time Value Ref Range Status  03/08/2020 01:47 PM 6.4 4.6 - 6.5 % Final    Comment:    Glycemic Control Guidelines for People with Diabetes:Non Diabetic:  <6%Goal of Therapy: <7%Additional Action Suggested:  >8%   09/08/2018 12:20 AM 6.0 (H) 4.8 - 5.6 % Final    Comment:    (NOTE) Pre diabetes:          5.7%-6.4% Diabetes:              >6.4% Glycemic control for   <7.0% adults with diabetes     CBG: Recent Labs  Lab  11/19/20 1158 11/19/20 1632 11/19/20 2103 11/20/20 0302 11/20/20 0618  GLUCAP 142* 95 112* 114* 113*     Review of Systems:   12 point review of system taken, please see HPI for positives and negatives.   Past Medical History:  She,  has a past medical history of Anemia, Asthma, CAD (coronary artery disease), Carpal tunnel syndrome, Cellulitis of both lower extremities (04/11/2015), CHF (congestive heart failure) ([place]), Chronic kidney disease, Colon polyp, hyperplastic (7673 & 4193), Complication of anesthesia (1999), CTS (carpal tunnel syndrome), Diabetes mellitus, Eczema, GERD (gastroesophageal reflux disease), History of kidney stones (1999), Hyperlipidemia, Hypertension, Leg ulcer ([place]) (04/24/2015), Leg ulcer ([place]), Meralgia paresthetica, Morbid obesity ([place]), Neuropathy, Osteoarthrosis, unspecified whether generalized or localized, lower leg, PUD (peptic ulcer disease), Shortness of breath dyspnea, Sleep apnea, Type II or unspecified type diabetes mellitus without mention of complication, not stated as uncontrolled, Unspecified hereditary and idiopathic peripheral neuropathy, Urticaria, Vitamin B12 deficiency, Wears glasses, and Wound cellulitis.   Surgical History:   Past Surgical History:  Procedure Laterality Date   ABDOMINAL HYSTERECTOMY     BREAST BIOPSY Right 07/08/2018   CARDIAC CATHETERIZATION  2002   non obstructive disease   colonoscopy with polypectomy  2007 & 2012    hyperplastic ;Dr Watt Climes   COLONOSCOPY WITH PROPOFOL N/A 06/04/2015   Procedure: COLONOSCOPY WITH PROPOFOL;  Surgeon: Jerene Bears, MD;  Location: [facility];  Service:  Gastroenterology;  Laterality: N/A;   DEBRIDEMENT LEG Right 03/02/2018   WOUND VAC APPLIED   DEBRIDEMENT LEG Right 05/27/2018   RIGHT LOWER LEG DEBRIDEMENT,  SKIN GRAFT, VAC PLACEMENT    DILATION AND CURETTAGE OF UTERUS     multiple   HEMORRHOID SURGERY     I & D EXTREMITY Right 03/02/2018   Procedure: RIGHT LEG DEBRIDEMENT AND PLACE  VAC;  Surgeon: [name], MD;  Location: [place];  Service: Orthopedics;  Laterality: Right;   I & D EXTREMITY Right 03/04/2018   Procedure: REPEAT IRRIGATION AND DEBRIDEMENT RIGHT LEG, PLACE WOUND VAC;  Surgeon: [name], MD;  Location: [place];  Service: Orthopedics;  Laterality: Right;   I & D EXTREMITY Right 03/30/2018   Procedure: IRRIGATION AND DEBRIDEMENT RIGHT LEG, APPLY WOUND VAC;  Surgeon: [name], MD;  Location: [place];  Service: Orthopedics;  Laterality: Right;   I & D EXTREMITY Right 05/27/2018   Procedure: RIGHT LOWER LEG DEBRIDEMENT,  SKIN GRAFT, VAC PLACEMENT;  Surgeon: [name], MD;  Location: [place];  Service: Orthopedics;  Laterality: Right;  RIGHT LOWER LEG DEBRIDEMENT,  SKIN GRAFT, VAC PLACEMENT   renal calculi  12/1997   SVT with induction of anesthesia   RIGHT HEART CATH N/A 11/19/2020   Procedure: RIGHT HEART CATH;  Surgeon: Jolaine Artist, MD;  Location: [facility];  Service: Cardiovascular;  Laterality: N/A;   right knee arthroscopy     SKIN SPLIT GRAFT Right 03/04/2018   Procedure: POSSIBLE SPLIT THICKNESS SKIN GRAFT;  Surgeon: [name], MD;  Location: [place];  Service: Orthopedics;  Laterality: Right;   SKIN SPLIT GRAFT Right 04/01/2018   Procedure: REPEAT IRRIGATION AND DEBRIDEMENT RIGHT LEG, APPLY SPLIT THICKNESS SKIN GRAFT;  Surgeon: [name], MD;  Location: [place];  Service: Orthopedics;  Laterality: Right;   TONSILLECTOMY AND ADENOIDECTOMY       Social History:   reports that she has never smoked. She has never used smokeless tobacco. She reports that she does not drink alcohol and does not use drugs.   Family History:  Her family history includes Breast cancer in her cousin, maternal aunt, and maternal aunt; Colon cancer in her father and mother; Diabetes in her maternal aunt, maternal uncle, paternal aunt, and paternal uncle; Heart disease in her paternal aunt; Prostate cancer in her father; Stroke in her paternal aunt.    Allergies Allergies  Allergen Reactions   Penicillins Rash and Other (See Comments)    She was told not to take it anymore. Has patient had a PCN reaction causing immediate rash, facial/tongue/throat swelling, SOB or lightheadedness with hypotension: Yes Has patient had a PCN reaction causing severe rash involving mucus membranes or skin necrosis: No Has patient had a PCN reaction that required hospitalization: No Has patient had a PCN reaction occurring within the last 10 years: No If all of the above answers are "NO", then may proceed with Cephalosporin use.'   Shellfish Allergy Anaphylaxis   Sulfonamide Derivatives Anaphylaxis and Other (See Comments)   Hydrochlorothiazide W-Triamterene Other (See Comments)    Hypokalemia   Lotensin [Benazepril Hcl] Hives   Dipyridamole Other (See Comments)    Unknown reaction   Estrogens Other (See Comments)    Unknown reaction   Hydrochlorothiazide Other (See Comments)    Unknown reaction   Latex Other (See Comments)    Unknown reaction - stated previously during surgery gloves had to be changed, but unsure if  it is still an allergy or not.    Metronidazole Other (See Comments)    Unknown reaction   Other Itching    PICKLES   Spironolactone Other (See Comments)    UNSPECIFIED > "kidney problems"   Sulfa Antibiotics Other (See Comments)    Unknown reaction   Torsemide Other (See Comments)    Unknown reaction   Valsartan Other (See Comments)    Unknown reaction   Mustard [Allyl Isothiocyanate] Itching     Home Medications  Prior to Admission medications   Medication Sig Start Date End Date Taking? Authorizing Provider  acetaminophen (TYLENOL) 325 MG tablet Take 2 tablets (650 mg total) by mouth every 6 (six) hours as needed for moderate pain (or Fever >/= 101). 11/16/18  Yes Arrien, Jimmy Picket, MD  allopurinol (ZYLOPRIM) 100 MG tablet Take 2 tablets (200 mg total) by mouth daily. 06/27/20  Yes Burns, Claudina Lick, MD  apixaban  (ELIQUIS) 5 MG TABS tablet Take 5 mg by mouth 2 (two) times daily.   Yes [provider]  Budesonide (PULMICORT FLEXHALER) 90 MCG/ACT inhaler Inhale 1 puff into the lungs 2 (two) times daily.   Yes [provider]  budesonide-formoterol (SYMBICORT) 160-4.5 MCG/ACT inhaler one - two inhalations every 12 hours; gargle and spit after use Patient taking differently: Inhale 1-2 puffs into the lungs every 12 (twelve) hours. 02/19/15  Yes Burns, Claudina Lick, MD  calcium carbonate (TUMS - DOSED IN MG ELEMENTAL CALCIUM) 500 MG chewable tablet Chew 2 tablets by mouth daily as needed for indigestion or heartburn.   Yes [provider]  carvedilol (COREG) 12.5 MG tablet TAKE 1 TABLET(12.5 MG) BY MOUTH TWICE DAILY WITH A MEAL Patient taking differently: Take 12.5 mg by mouth 2 (two) times daily with a meal. 09/16/20  Yes Burns, Claudina Lick, MD  cholecalciferol (VITAMIN D3) 25 MCG (1000 UNIT) tablet Take 1 tablet (1,000 Units total) by mouth daily. 09/16/20  Yes Burns, Claudina Lick, MD  colchicine 0.6 MG tablet Take 2 tabs po at onset of gout flare, then one hour later take 1 tab Patient taking differently: Take 1.2 mg by mouth daily as needed (gout flare). 06/28/20  Yes Burns, Claudina Lick, MD  cyanocobalamin (,VITAMIN B-12,) 1000 MCG/ML injection INJECT 1ML INTO MUSCLE EVERY 30 DAYS Patient taking differently: Inject 1,000 mcg into the skin every 30 (thirty) days. 07/31/20  Yes Burns, Claudina Lick, MD  diclofenac sodium (VOLTAREN) 1 % GEL Apply 2 g topically 4 (four) times daily. Patient taking differently: Apply 2 g topically 4 (four) times daily as needed (dry skin, rash). 03/20/16  Yes Marcial Pacas, MD  eszopiclone (LUNESTA) 1 MG TABS tablet Take 1 tablet (1 mg total) by mouth at bedtime as needed for sleep. Take immediately before bedtime Patient taking differently: Take 1 mg by mouth at bedtime as needed for sleep. 10/02/20 11/15/20 Yes Olalere, Adewale A, MD  famotidine (PEPCID) 20 MG tablet Take 1 tablet (20  mg total) by mouth 2 (two) times daily. 11/11/20  Yes Burns, Claudina Lick, MD  FEROSUL 325 (65 Fe) MG tablet TAKE 1 TABLET BY MOUTH EVERY MONDAY, [place], AND FRIDAY Patient taking differently: Take 325 mg by mouth every Monday, Wednesday, and Friday. 10/17/20  Yes Burns, Claudina Lick, MD  folic acid (FOLVITE) 701 MCG tablet Take 1 tablet (400 mcg total) by mouth at bedtime. Take 400 mcg by mouth at bedtime. Patient taking differently: Take 400 mcg by mouth at bedtime. 03/12/20  Yes [name], MD  gabapentin (NEURONTIN) 100 MG capsule TAKE 1 CAPSULE(100 MG) BY MOUTH THREE TIMES DAILY Patient taking differently: Take 100 mg by mouth 3 (three) times daily. 09/16/20  Yes Burns, Claudina Lick, MD  hydrOXYzine (ATARAX/VISTARIL) 25 MG tablet Take 1 tablet (25 mg total) by mouth 3 (three) times daily as needed for itching. 03/01/19  Yes Fair, Marin Shutter, MD  montelukast (SINGULAIR) 10 MG tablet Take 1 tablet (10 mg total) by mouth at bedtime. 03/12/20  Yes Burns, Claudina Lick, MD  Nutritional Supplements (,FEEDING SUPPLEMENT, PROSOURCE PLUS) liquid Take 30 mLs by mouth 2 (two) times daily between meals. 05/15/20  Yes Antonieta Pert, MD  Oxcarbazepine (TRILEPTAL) 300 MG tablet Take 1 tablet (300 mg total) by mouth 2 (two) times daily. 06/03/20  Yes Burns, Claudina Lick, MD  oxyCODONE (OXY IR/ROXICODONE) 5 MG immediate release tablet Take 1 tablet (5 mg total) by mouth every 6 (six) hours as needed for severe pain (chronic knee and back pain). 10/28/20  Yes Burns, Claudina Lick, MD  polyethylene glycol (MIRALAX / GLYCOLAX) 17 g packet Take 17 g by mouth daily. Patient taking differently: Take 17 g by mouth daily as needed for mild constipation. 03/02/19  Yes Fair, Marin Shutter, MD  potassium chloride SA (KLOR-CON) 20 MEQ tablet Take 20 mEq by mouth 3 (three) times daily. 11/09/20  Yes [provider]  senna-docusate (SENOKOT-S) 8.6-50 MG tablet Take 1 tablet by mouth at bedtime as needed for mild constipation. 03/01/19  Yes Fair, Marin Shutter, MD   simvastatin (ZOCOR) 40 MG tablet Take 1 tablet (40 mg total) by mouth at bedtime. 05/06/20  Yes Burns, Claudina Lick, MD  spironolactone (ALDACTONE) 25 MG tablet TAKE 1 TABLET(25 MG) BY MOUTH DAILY Patient taking differently: Take 25 mg by mouth daily. 10/28/20  Yes Burns, Claudina Lick, MD  torsemide (DEMADEX) 20 MG tablet Take 3 tablets (60 mg total) by mouth 2 (two) times daily. May take an additional tablet as directed by CHF clinic. 09/03/20 12/02/20 Yes [name], Maricela Bo, FNP  VENTOLIN HFA 108 (90 Base) MCG/ACT inhaler Inhale 2 puffs into the lungs every 4 (four) hours as needed for wheezing. 11/13/19  Yes [provider]  Alcohol Swabs (B-D SINGLE USE SWABS REGULAR) PADS UAD   E11.9 10/14/20   [name], MD  Blood Glucose Calibration (TRUE METRIX LEVEL 1) Low SOLN UAD  E11.9 10/14/20   [name], MD     Critical care time:     Lanier Clam, MD [place] Please consult [place] 11/20/2020, 9:37 AM

## 2020-11-20 NOTE — Progress Notes (Signed)
RT called regarding pt becoming more lethargic. When RT got to pts room pt was very hard to wake up. ABG obtained CO2 85. Pt is now more awake and agreed to be placed on bipap. Pt seems to be tolerating fairly well at this time. RT will continue to monitor as needed.

## 2020-11-20 NOTE — Progress Notes (Signed)
Physical Therapy Treatment Patient Details Name: April [name] MRN: [id number] DOB: May 30, 1950 Today's Date: 11/20/2020   History of Present Illness 70 y.o. female [facility] to [facility] hospital on 11/15/2020 from advanced heart failure clinic with hypoxia, weakness, and near syncope. Pt admitted for acute on chronic hypoxic and hypercapnic respiratory failure and CHF exacerbation requiring BiPAP. PMH includes diastolic CHF, DM-2, CKD 3, morbid obesity, asthma, OSA not on CPAP, DVT on Eliquis, CAD and ambulatory dysfunction.    PT Comments    Pt reports fatigue following OT session, but agreeable to participate. Performed therapeutic exercises for strengthening and endurance (see below) and serial sit to stands from edge of bed. SpO2 88-94% on 2L O2 via HFNC. Pt continues with deconditioning/debility, decreased endurance, weakness, balance deficits. Recommend follow up PT to address.    Recommendations for follow up therapy are one component of a multi-disciplinary discharge planning process, led by the attending physician.  Recommendations may be updated based on patient status, additional functional criteria and insurance authorization.  Follow Up Recommendations  Home health PT;Supervision for mobility/OOB (pt refusing SNF)     Equipment Recommendations  None recommended by PT (has necessary DME)    Recommendations for Other Services       Precautions / Restrictions Precautions Precautions: Fall Precaution Comments: HFNC Restrictions Weight Bearing Restrictions: No     Mobility  Bed Mobility Overal bed mobility: Needs Assistance Bed Mobility: Supine to Sit     Supine to sit: Min assist;HOB elevated     General bed mobility comments: MinA at trunk to elevate to upright    Transfers Overall transfer level: Needs assistance Equipment used: Rolling walker (2 wheeled) Transfers: Sit to/from Stand Sit to Stand: Min assist         General transfer comment: MinA to rise,  cues for nose over toes, glute activation  Ambulation/Gait                 Stairs             Wheelchair Mobility    Modified Rankin (Stroke Patients Only)       Balance Overall balance assessment: Needs assistance Sitting-balance support: No upper extremity supported;Feet supported Sitting balance-Leahy Scale: Fair     Standing balance support: Bilateral upper extremity supported Standing balance-Leahy Scale: Poor Standing balance comment: reliant on BUE support to maintain static standing balance. Poor standing tolerance.                            Cognition Arousal/Alertness: Awake/alert Behavior During Therapy: WFL for tasks assessed/performed Overall Cognitive Status: Within Functional Limits for tasks assessed                                        Exercises General Exercises - Upper Extremity Shoulder Flexion: Both;10 reps;Supine Elbow Flexion: Both;20 reps;Supine General Exercises - Lower Extremity Ankle Circles/Pumps: Both;20 reps;Supine Quad Sets: Both;15 reps;Supine Long Arc Quad: Both;10 reps;Seated Heel Slides: Both;10 reps;Supine Other Exercises Other Exercises: x5 Sit to stands    General Comments        Pertinent Vitals/Pain Pain Assessment: 0-10 Pain Score: 8  Pain Location: Bilateral knees (L> R) Pain Descriptors / Indicators: Aching;Sore;Shooting Pain Intervention(s): Limited activity within patient's tolerance;Monitored during session    [place]  Prior Function            PT Goals (current goals can now be found in the care plan section) Acute Rehab PT Goals Patient Stated Goal: to improve strength and activity tolerance and return home Potential to Achieve Goals: Fair Progress towards PT goals: Progressing toward goals    Frequency    Min 3X/week      PT Plan Current plan remains appropriate    Co-evaluation              AM-PAC PT "6  Clicks" Mobility   Outcome Measure  Help needed turning from your back to your side while in a flat bed without using bedrails?: A Little Help needed moving from lying on your back to sitting on the side of a flat bed without using bedrails?: A Little Help needed moving to and from a bed to a chair (including a wheelchair)?: A Little Help needed standing up from a chair using your arms (e.g., wheelchair or bedside chair)?: A Little Help needed to walk in hospital room?: A Lot Help needed climbing 3-5 steps with a railing? : Total 6 Click Score: 15    End of Session Equipment Utilized During Treatment: Oxygen Activity Tolerance: Patient tolerated treatment well Patient left: in bed;with call bell/phone within reach Nurse Communication: Mobility status PT Visit Diagnosis: Other abnormalities of gait and mobility (R26.89);Muscle weakness (generalized) (M62.81);History of falling (Z91.81)     Time: 6015-6153 PT Time Calculation (min) (ACUTE ONLY): 35 min  Charges:  $Therapeutic Exercise: 8-22 mins $Therapeutic Activity: 8-22 mins                     Wyona Almas, PT, DPT Acute Rehabilitation Services Pager [phone] Office [place] 11/20/2020, 5:20 PM

## 2020-11-20 NOTE — Progress Notes (Signed)
April [name] presents with acute on chronic hypercarbic hypoxic respiratory failure in the setting of obesity hypoventilation syndrome.  The use of the NIV will treat patients high PC02 levels (85.8 on 11/20/20 with elevated bicarb of 39) and can reduce risk of exacerbations and future hospitalizations when used at night and during the day.  All alternate devices [phone] and [id number]) have been proven ineffective to provide essential volume control necessary to maintain acceptable CO2 levels. An NIV with AVAPS AE is necessary to prevent patient harm.  Interruption or failure to provide NIV would quickly lead to exacerbation of the patients condition, hospital admission, and likely harm to the patient. Continued use is preferred.  Patient is able to protect their airways and clear secretions on their own

## 2020-11-20 NOTE — Progress Notes (Signed)
PROGRESS NOTE    April [name]  BWI:[id number] DOB: 09/23/1950 DOA: 11/15/2020 PCP: [name], MD   Brief Narrative: 70 year old with past medical history significant for diastolic heart failure, diabetes type 2, CKD stage IIIa, morbid obesity, asthma, OSA not on CPAP, DVT on Eliquis, CAD and ambulatory dysfunction presents from advanced heart failure clinic with hypoxia 77%, generalized weakness and near syncope.  Patient was admitted for acute on chronic hypoxic hypercapnic respiratory failure and CHF exacerbation requiring BiPAP.  Also she was a started on IV Lasix. Cardiology and PCCM were following.  Patient remains on IV Lasix.  Patient underwent right heart cath on 10/19.  Patient will need NIPPV on discharge. Overnight patient became more lethargic, she has low-grade fever, blood gas showed worsening hypercapnia.  She was placed on BiPAP.  Subsequently she became more  alert.   Assessment & Plan:   Principal Problem:   Acute on chronic respiratory failure with hypoxia and hypercapnia (HCC) Active Problems:   Essential hypertension   CAD, NATIVE VESSEL   Acute on chronic diastolic CHF (congestive heart failure) (HCC)   Diabetes mellitus with neurological manifestations (HCC)   CKD (chronic kidney disease) stage 3, GFR 30-59 ml/min (HCC)   Morbid obesity (HCC)   Obesity hypoventilation syndrome (HCC)   History of DVT (deep vein thrombosis)   Acute on chronic respiratory failure, unspecified whether with hypoxia or hypercapnia (HCC)  1-Acute on Chronic Diastolic Heart Failure: Morbid obesity BMI 62. Patient received IV Lasix, cardiology following.  Transition to torsemide 10/19 Underwent right heart cath: Mild to moderate pulmonary hypertension, minimally elevated PCWP, normal cardiac output. Continue with spironolactone, Bystolic.  2-Acute on chronic respiratory failure with hypoxia, hypercapnia: ABG pH 7.2, PCO2 80.  Patient was placed on BiPAP. Overnight became more  lethargic.  PCO2 85.  Placed on BiPAP.  Mental status improved. Will require NIPPV trilogy vent at discharge until she is able to have a sleep study.   History of asthma: Continue with nebulizer AKI on CKD 3A: Improved with History of CAD: Continue with Lipitor History of bilateral DVT: Continue with Eliquis Normocytic anemia: Continue to monitor hemoglobin Mild Thrombocytopenia: Monitor Physical deconditioning debility: PT recommending SNF but patient declined Morbid obesity: Needs lifestyle modification Hypokalemia: Continue with potassium supplementation Low-grade fever: Pyuria, follow urine culture.  Chest x-ray with left lower lobe atelectasis or infiltrate.  Started on Doxy and ceftriaxone.      Nutrition Problem: Increased nutrient needs Etiology: chronic illness (CHF)    Signs/Symptoms: estimated needs    Interventions: Magic cup, Education, Liberalize Diet  Estimated body mass index is 60.81 kg/m as calculated from the following:   Height as of this encounter: 5\' 4"  (1.626 m).   Weight as of this encounter: 160.7 kg.   DVT prophylaxis: Eliquis Code Status: Full code Family Communication: Care discussed with patient Disposition Plan:  Status is: Inpatient  Remains inpatient appropriate because: High acute hypercapnic respiratory failure heart failure exacerbation altered mental status        Consultants:  Cardiology CCM  Procedures:  Right side heart cath; mild to moderate pulmonary hypertension, minimally elevated PCWP, normal cardiac output.  Antimicrobials:    Subjective: Patient more alert this morning off of BiPAP on 4 L of oxygen oxygen saturation 95%, I have decreased her oxygen to 2 L.  Long discussion with patient about the importance of BiPAP.  She agreed to use it.  Objective: Vitals:   11/20/20 0744 11/20/20 0901 11/20/20 1138 11/20/20 1549  BP: 133/71  112/60 117/87  Pulse: 84  79 79  Resp: 17  18 17   Temp:   98.6 F (37 C)  98.8 F (37.1 C)  TempSrc:   Oral Oral  SpO2: 95% 95% 94% 94%  Weight:      Height:        Intake/Output Summary (Last 24 hours) at 11/20/2020 1606 Last data filed at 11/20/2020 1500 Gross per 24 hour  Intake 774 ml  Output 2300 ml  Net -1526 ml   Filed Weights   11/18/20 0449 11/19/20 0612 11/20/20 0254  Weight: (!) 165.9 kg (!) 165.7 kg (!) 160.7 kg    Examination:  General exam: Appears calm and comfortable , morbidly obese Respiratory system: Bilateral decreased breath sounds Cardiovascular system: S1 & S2 heard, RRR.  Gastrointestinal system: Abdomen is nondistended, soft and nontender. No organomegaly or masses felt. Normal bowel sounds heard. Central nervous system: Alert and oriented. No focal neurological deficits. Extremities: Symmetric 5 x 5 power.    Data Reviewed: I have personally reviewed following labs and imaging studies  CBC: Recent Labs  Lab 11/15/20 1247 11/15/20 1900 11/17/20 0121 11/18/20 0053 11/19/20 0141 11/19/20 1436 11/19/20 1441 11/20/20 0240  WBC 6.0  --  5.9 5.6 5.4  --   --  5.5  NEUTROABS 4.6  --   --   --   --   --   --   --   HGB 12.3   < > 10.9* 10.2* 10.4* 12.9 12.9 12.4  HCT 41.3   < > 37.5 35.1* 35.1* 38.0 38.0 41.5  MCV 103.0*  --  105.0* 104.8* 104.8*  --   --  103.8*  PLT 166  --  143* 142* 146*  --   --  156   < > = values in this interval not displayed.   Basic Metabolic Panel: Recent Labs  Lab 11/16/20 0344 11/17/20 0121 11/18/20 0053 11/19/20 0141 11/19/20 1436 11/19/20 1441 11/20/20 0240  NA 140 136 138 135 142 143 131*  K 4.3 4.7 4.0 3.7 3.6 3.5 3.4*  CL 97* 96* 97* 95*  --   --  88*  CO2 35* 31 34* 33*  --   --  36*  GLUCOSE 105* 119* 128* 145*  --   --  124*  BUN 17 21 21 20   --   --  19  CREATININE 1.38* 1.40* 1.39* 1.35*  --   --  1.38*  CALCIUM 8.6* 8.2* 8.3* 8.1*  --   --  8.5*  MG  --  2.2 2.3 2.3  --   --  2.4  PHOS  --  4.5 3.6 3.9  --   --   --    GFR: Estimated Creatinine Clearance:  58.1 mL/min (A) (by C-G formula based on SCr of 1.38 mg/dL (H)). Liver Function Tests: Recent Labs  Lab 11/17/20 0121 11/18/20 0053 11/19/20 0141  ALBUMIN 2.8* 2.7* 2.7*   No results for input(s): LIPASE, AMYLASE in the last 168 hours. Recent Labs  Lab 11/15/20 1543  AMMONIA <10   Coagulation Profile: No results for input(s): INR, PROTIME in the last 168 hours. Cardiac Enzymes: No results for input(s): CKTOTAL, CKMB, CKMBINDEX, TROPONINI in the last 168 hours. BNP (last 3 results) No results for input(s): PROBNP in the last 8760 hours. HbA1C: No results for input(s): HGBA1C in the last 72 hours. CBG: Recent Labs  Lab 11/19/20 2103 11/20/20 0302 11/20/20 0618 11/20/20 1135 11/20/20 1552  GLUCAP 112*  114* 113* 105* 112*   Lipid Profile: No results for input(s): CHOL, HDL, LDLCALC, TRIG, CHOLHDL, LDLDIRECT in the last 72 hours. Thyroid Function Tests: No results for input(s): TSH, T4TOTAL, FREET4, T3FREE, THYROIDAB in the last 72 hours. Anemia Panel: Recent Labs    11/19/20 0141  VITAMINB12 1,681*  FOLATE 38.7  FERRITIN 79  TIBC 248*  IRON 51  RETICCTPCT 2.6   Sepsis Labs: Recent Labs  Lab 11/20/20 0438  PROCALCITON <0.10    Recent Results (from the past 240 hour(s))  Resp Panel by RT-PCR (Flu A&B, Covid) Nasopharyngeal Swab     Status: None   Collection Time: 11/15/20 12:50 PM   Specimen: Nasopharyngeal Swab; Nasopharyngeal(NP) swabs in vial transport medium  Result Value Ref Range Status   SARS Coronavirus 2 by RT PCR NEGATIVE NEGATIVE Final    Comment: (NOTE) SARS-CoV-2 target nucleic acids are NOT DETECTED.  The SARS-CoV-2 RNA is generally detectable in upper respiratory specimens during the acute phase of infection. The lowest concentration of SARS-CoV-2 viral copies this assay can detect is 138 copies/mL. A negative result does not preclude SARS-Cov-2 infection and should not be used as the sole basis for treatment or other patient management  decisions. A negative result may occur with  improper specimen collection/handling, submission of specimen other than nasopharyngeal swab, presence of viral mutation(s) within the areas targeted by this assay, and inadequate number of viral copies(<138 copies/mL). A negative result must be combined with clinical observations, patient history, and epidemiological information. The expected result is Negative.  Fact Sheet for Patients:  EntrepreneurPulse.com.au  Fact Sheet for Healthcare Providers:  IncredibleEmployment.be  This test is no t yet approved or cleared by the [place] FDA and  has been authorized for detection and/or diagnosis of SARS-CoV-2 by FDA under an Emergency Use Authorization (EUA). This EUA will remain  in effect (meaning this test can be used) for the duration of the COVID-19 declaration under Section 564(b)(1) of the Act, 21 U.S.C.section 360bbb-3(b)(1), unless the authorization is terminated  or revoked sooner.       Influenza A by PCR NEGATIVE NEGATIVE Final   Influenza B by PCR NEGATIVE NEGATIVE Final    Comment: (NOTE) The Xpert Xpress SARS-CoV-2/FLU/RSV plus assay is intended as an aid in the diagnosis of influenza from Nasopharyngeal swab specimens and should not be used as a sole basis for treatment. Nasal washings and aspirates are unacceptable for Xpert Xpress SARS-CoV-2/FLU/RSV testing.  Fact Sheet for Patients: EntrepreneurPulse.com.au  Fact Sheet for Healthcare Providers: IncredibleEmployment.be  This test is not yet approved or cleared by the [place] FDA and has been authorized for detection and/or diagnosis of SARS-CoV-2 by FDA under an Emergency Use Authorization (EUA). This EUA will remain in effect (meaning this test can be used) for the duration of the COVID-19 declaration under Section 564(b)(1) of the Act, 21 U.S.C. section 360bbb-3(b)(1), unless the  authorization is terminated or revoked.  Performed at [facility], [place] [address] [place], [place] [address]          Radiology Studies: CARDIAC CATHETERIZATION  Result Date: 11/19/2020 Findings: RA = 11 RV = 68/12 PA = 69/22 (39) PCW = 18 Fick cardiac output/index = 7.0/2.8 PVR = 3.0 WU Ao sat = 96% PA sat = 61%, 62% SVC sat 69% PAPi = 4.2 Assessment: 1. Mild to moderate pulmonary HTN 2. Minimally elevated PCWP 3. Normal cardiac output Plan/Discussion: Volume status looks good. Main issue likely OSA/OHS. Glori Bickers, MD 2:46 PM  DG CHEST PORT 1 VIEW  Result Date: 11/20/2020 CLINICAL DATA:  Reason for exam: fever Patient was non verbal throughout the exam. Warm to the touch. Hx of asthma, chf, diabetes, htn. Hx of right heart cath 11/19/2020. EXAM: PORTABLE CHEST - 1 VIEW COMPARISON:  11/16/2020 FINDINGS: Patchy airspace opacities at the left lung base. Relatively low lung volumes with crowding of bronchovascular structures. Heart size upper limits normal. Aortic Atherosclerosis (ICD10-170.0). Blunting of lateral costophrenic angles suggesting small effusions. No pneumothorax. Vertebral endplate spurring at multiple levels in the mid and lower thoracic spine. IMPRESSION: Patchy left lower lung atelectasis or infiltrate. Electronically Signed   By: Lucrezia Europe M.D.   On: 11/20/2020 07:48        Scheduled Meds:  acetaZOLAMIDE  250 mg Oral BID   allopurinol  200 mg Oral Daily   apixaban  5 mg Oral BID   atorvastatin  40 mg Oral QHS   cholecalciferol  1,000 Units Oral Daily   doxycycline  100 mg Oral Q12H   famotidine  20 mg Oral BID   ferrous sulfate  325 mg Oral Q M,W,F   folic acid  1,610 mcg Oral QHS   gabapentin  100 mg Oral TID   Gerhardt's butt cream   Topical TID   mometasone-formoterol  2 puff Inhalation BID   montelukast  10 mg Oral QHS   nebivolol  5 mg Oral Daily   Oxcarbazepine  300 mg Oral BID   potassium chloride  40 mEq Oral Q4H   sodium chloride  flush  3 mL Intravenous Q12H   sodium chloride flush  3 mL Intravenous Q12H   spironolactone  25 mg Oral Daily   torsemide  80 mg Oral BID   Continuous Infusions:  sodium chloride     cefTRIAXone (ROCEPHIN)  IV 2 g (11/20/20 1130)     LOS: 4 days    Time spent: 35 minutes.     Elmarie Shiley, MD [facility]   If 7PM-7AM, please contact night-coverage [email or web address]  11/20/2020, 4:06 PM

## 2020-11-21 ENCOUNTER — Other Ambulatory Visit: Payer: Self-pay | Admitting: Physician Assistant

## 2020-11-21 LAB — BASIC METABOLIC PANEL
Anion gap: 9 (ref 5–15)
BUN: 25 mg/dL — ABNORMAL HIGH (ref 8–23)
CO2: 34 mmol/L — ABNORMAL HIGH (ref 22–32)
Calcium: 8.6 mg/dL — ABNORMAL LOW (ref 8.9–10.3)
Chloride: 94 mmol/L — ABNORMAL LOW (ref 98–111)
Creatinine, Ser: 1.55 mg/dL — ABNORMAL HIGH (ref 0.44–1.00)
GFR, Estimated: 36 mL/min — ABNORMAL LOW (ref >60)
Glucose, Bld: 121 mg/dL — ABNORMAL HIGH (ref 70–99)
Potassium: 3.9 mmol/L (ref 3.5–5.1)
Sodium: 137 mmol/L (ref 135–145)

## 2020-11-21 LAB — URINE CULTURE

## 2020-11-21 LAB — GLUCOSE, CAPILLARY
Glucose-Capillary: 113 mg/dL — ABNORMAL HIGH (ref 70–99)
Glucose-Capillary: 167 mg/dL — ABNORMAL HIGH (ref 70–99)
Glucose-Capillary: 102 mg/dL — ABNORMAL HIGH (ref 70–99)
Glucose-Capillary: 104 mg/dL — ABNORMAL HIGH (ref 70–99)

## 2020-11-21 MED ORDER — CEFDINIR 300 MG PO CAPS
300.0000 mg | ORAL_CAPSULE | Freq: Two times a day (BID) | ORAL | Status: DC
  Administered 2020-11-21 – 2020-11-22 (×2): 300 mg via ORAL
  Filled 2020-11-21 (×3): qty 1

## 2020-11-21 NOTE — Progress Notes (Signed)
PROGRESS NOTE    April [name]  JJK:[id number] DOB: 1950-11-19 DOA: 11/15/2020 PCP: [name], MD   Brief Narrative: 70 year old with past medical history significant for diastolic heart failure, diabetes type 2, CKD stage IIIa, morbid obesity, asthma, OSA not on CPAP, DVT on Eliquis, CAD and ambulatory dysfunction presents from advanced heart failure clinic with hypoxia 77%, generalized weakness and near syncope.  Patient was admitted for acute on chronic hypoxic hypercapnic respiratory failure and CHF exacerbation requiring BiPAP.  Also she was a started on IV Lasix. Cardiology and PCCM were following.  Patient remains on IV Lasix.  Patient underwent right heart cath on 10/19.  Patient will need NIPPV on discharge. Overnight patient became more lethargic, she has low-grade fever, blood gas showed worsening hypercapnia.  She was placed on BiPAP.  Subsequently she became more  alert.   Assessment & Plan:   Principal Problem:   Acute on chronic respiratory failure with hypoxia and hypercapnia (HCC) Active Problems:   Essential hypertension   CAD, NATIVE VESSEL   Acute on chronic diastolic CHF (congestive heart failure) (HCC)   Diabetes mellitus with neurological manifestations (HCC)   CKD (chronic kidney disease) stage 3, GFR 30-59 ml/min (HCC)   Morbid obesity (HCC)   Obesity hypoventilation syndrome (HCC)   History of DVT (deep vein thrombosis)   Acute on chronic respiratory failure, unspecified whether with hypoxia or hypercapnia (HCC)  1-Acute on Chronic Diastolic Heart Failure: Morbid obesity BMI 62. Patient received IV Lasix, cardiology following.  Transition to torsemide 10/19 Underwent right heart cath: Mild to moderate pulmonary hypertension, minimally elevated PCWP, normal cardiac output. Continue with spironolactone, Bystolic. Back on torsemide. Monitor renal function.   2-Acute on chronic respiratory failure with hypoxia, hypercapnia: ABG pH 7.2, PCO2 80.   Patient was placed on BiPAP. Overnight became more lethargic.  PCO2 85.  Placed on BiPAP.  Mental status improved. Will require NIPPV trilogy vent at discharge until she is able to have a sleep study. CM working on NIPPV  History of asthma: Continue with nebulizer AKI on CKD 3A: prior cr 1.3--1.5 Monitor on torsemide.   History of CAD: Continue with Lipitor History of bilateral DVT: Continue with Eliquis Normocytic anemia: Continue to monitor hemoglobin Mild Thrombocytopenia: Monitor Physical deconditioning debility: PT recommending SNF but patient declined Morbid obesity: Needs lifestyle modification Hypokalemia: Continue with potassium supplementation Low-grade fever: urine culture; multiples species.  Chest x-ray with left lower lobe atelectasis or infiltrate.  Started on Doxy and ceftriaxone.  Change ceftriaxone to cefdinir.      Nutrition Problem: Increased nutrient needs Etiology: chronic illness (CHF)    Signs/Symptoms: estimated needs    Interventions: Magic cup, Education, Liberalize Diet  Estimated body mass index is 60.17 kg/m as calculated from the following:   Height as of this encounter: 5\' 4"  (1.626 m).   Weight as of this encounter: 159 kg.   DVT prophylaxis: Eliquis Code Status: Full code Family Communication: Care discussed with patient Disposition Plan:  Status is: Inpatient  Remains inpatient appropriate because: home when able to get BIPAP for discharge.         Consultants:  Cardiology CCM  Procedures:  Right side heart cath; mild to moderate pulmonary hypertension, minimally elevated PCWP, normal cardiac output.  Antimicrobials:    Subjective: Sleepy this am. Wake up an answer questions.  Will get palliative care consult for goals of care, code status. Patient use BIPAP last night for 2 hours.   Objective: Vitals:   11/21/20  0002 11/21/20 0318 11/21/20 0838 11/21/20 1602  BP: (!) 95/52 (!) 114/58  102/82  Pulse: 79 74  72   Resp: 17 16  17   Temp: 100.1 F (37.8 C) 98.4 F (36.9 C)  98.7 F (37.1 C)  TempSrc: Axillary Axillary  Oral  SpO2: 97% 99% 99% 98%  Weight:  (!) 159 kg    Height:        Intake/Output Summary (Last 24 hours) at 11/21/2020 1605 Last data filed at 11/21/2020 1445 Gross per 24 hour  Intake 120 ml  Output 1550 ml  Net -1430 ml    Filed Weights   11/19/20 0612 11/20/20 0254 11/21/20 0318  Weight: (!) 165.7 kg (!) 160.7 kg (!) 159 kg    Examination:  General exam: Morbid obesity  Respiratory system: BL air movement Cardiovascular system: S 1, S 2 RRR Gastrointestinal system: BS present soft, nt Central nervous system: sleepy Extremities: Symmetric 5 x 5 power.    Data Reviewed: I have personally reviewed following labs and imaging studies  CBC: Recent Labs  Lab 11/15/20 1247 11/15/20 1900 11/17/20 0121 11/18/20 0053 11/19/20 0141 11/19/20 1436 11/19/20 1441 11/20/20 0240  WBC 6.0  --  5.9 5.6 5.4  --   --  5.5  NEUTROABS 4.6  --   --   --   --   --   --   --   HGB 12.3   < > 10.9* 10.2* 10.4* 13.3  12.9 12.9 12.4  HCT 41.3   < > 37.5 35.1* 35.1* 39.0  38.0 38.0 41.5  MCV 103.0*  --  105.0* 104.8* 104.8*  --   --  103.8*  PLT 166  --  143* 142* 146*  --   --  156   < > = values in this interval not displayed.    Basic Metabolic Panel: Recent Labs  Lab 11/17/20 0121 11/18/20 0053 11/19/20 0141 11/19/20 1436 11/19/20 1441 11/20/20 0240 11/21/20 0144  NA 136 138 135 140  142 143 131* 137  K 4.7 4.0 3.7 3.8  3.6 3.5 3.4* 3.9  CL 96* 97* 95*  --   --  88* 94*  CO2 31 34* 33*  --   --  36* 34*  GLUCOSE 119* 128* 145*  --   --  124* 121*  BUN 21 21 20   --   --  19 25*  CREATININE 1.40* 1.39* 1.35*  --   --  1.38* 1.55*  CALCIUM 8.2* 8.3* 8.1*  --   --  8.5* 8.6*  MG 2.2 2.3 2.3  --   --  2.4  --   PHOS 4.5 3.6 3.9  --   --   --   --     GFR: Estimated Creatinine Clearance: 51.4 mL/min (A) (by C-G formula based on SCr of 1.55 mg/dL (H)). Liver  Function Tests: Recent Labs  Lab 11/17/20 0121 11/18/20 0053 11/19/20 0141  ALBUMIN 2.8* 2.7* 2.7*    No results for input(s): LIPASE, AMYLASE in the last 168 hours. Recent Labs  Lab 11/15/20 1543  AMMONIA <10    Coagulation Profile: No results for input(s): INR, PROTIME in the last 168 hours. Cardiac Enzymes: No results for input(s): CKTOTAL, CKMB, CKMBINDEX, TROPONINI in the last 168 hours. BNP (last 3 results) No results for input(s): PROBNP in the last 8760 hours. HbA1C: No results for input(s): HGBA1C in the last 72 hours. CBG: Recent Labs  Lab 11/20/20 1552 11/20/20 2109 11/21/20 0604 11/21/20 1128  11/21/20 1603  GLUCAP 112* 128* 113* 167* 102*    Lipid Profile: No results for input(s): CHOL, HDL, LDLCALC, TRIG, CHOLHDL, LDLDIRECT in the last 72 hours. Thyroid Function Tests: No results for input(s): TSH, T4TOTAL, FREET4, T3FREE, THYROIDAB in the last 72 hours. Anemia Panel: Recent Labs    11/19/20 0141  VITAMINB12 1,681*  FOLATE 38.7  FERRITIN 79  TIBC 248*  IRON 51  RETICCTPCT 2.6    Sepsis Labs: Recent Labs  Lab 11/20/20 0438  PROCALCITON <0.10     Recent Results (from the past 240 hour(s))  Resp Panel by RT-PCR (Flu A&B, Covid) Nasopharyngeal Swab     Status: None   Collection Time: 11/15/20 12:50 PM   Specimen: Nasopharyngeal Swab; Nasopharyngeal(NP) swabs in vial transport medium  Result Value Ref Range Status   SARS Coronavirus 2 by RT PCR NEGATIVE NEGATIVE Final    Comment: (NOTE) SARS-CoV-2 target nucleic acids are NOT DETECTED.  The SARS-CoV-2 RNA is generally detectable in upper respiratory specimens during the acute phase of infection. The lowest concentration of SARS-CoV-2 viral copies this assay can detect is 138 copies/mL. A negative result does not preclude SARS-Cov-2 infection and should not be used as the sole basis for treatment or other patient management decisions. A negative result may occur with  improper specimen  collection/handling, submission of specimen other than nasopharyngeal swab, presence of viral mutation(s) within the areas targeted by this assay, and inadequate number of viral copies(<138 copies/mL). A negative result must be combined with clinical observations, patient history, and epidemiological information. The expected result is Negative.  Fact Sheet for Patients:  EntrepreneurPulse.com.au  Fact Sheet for Healthcare Providers:  IncredibleEmployment.be  This test is no t yet approved or cleared by the [place] FDA and  has been authorized for detection and/or diagnosis of SARS-CoV-2 by FDA under an Emergency Use Authorization (EUA). This EUA will remain  in effect (meaning this test can be used) for the duration of the COVID-19 declaration under Section 564(b)(1) of the Act, 21 U.S.C.section 360bbb-3(b)(1), unless the authorization is terminated  or revoked sooner.       Influenza A by PCR NEGATIVE NEGATIVE Final   Influenza B by PCR NEGATIVE NEGATIVE Final    Comment: (NOTE) The Xpert Xpress SARS-CoV-2/FLU/RSV plus assay is intended as an aid in the diagnosis of influenza from Nasopharyngeal swab specimens and should not be used as a sole basis for treatment. Nasal washings and aspirates are unacceptable for Xpert Xpress SARS-CoV-2/FLU/RSV testing.  Fact Sheet for Patients: EntrepreneurPulse.com.au  Fact Sheet for Healthcare Providers: IncredibleEmployment.be  This test is not yet approved or cleared by the [place] FDA and has been authorized for detection and/or diagnosis of SARS-CoV-2 by FDA under an Emergency Use Authorization (EUA). This EUA will remain in effect (meaning this test can be used) for the duration of the COVID-19 declaration under Section 564(b)(1) of the Act, 21 U.S.C. section 360bbb-3(b)(1), unless the authorization is terminated or revoked.  Performed at [facility], [place] [address], [place], [place] [address]   Culture, blood (routine x 2)     Status: None (Preliminary result)   Collection Time: 11/20/20  4:38 AM   Specimen: BLOOD  Result Value Ref Range Status   Specimen Description BLOOD RIGHT ANTECUBITAL  Final   Special Requests AEROBIC BOTTLE ONLY Blood Culture adequate volume  Final   Culture   Final    NO GROWTH 1 DAY Performed at [facility], [place]  [address], [place], [place] [address]    Report Status PENDING  Incomplete  Culture, blood (routine x 2)     Status: None (Preliminary result)   Collection Time: 11/20/20  4:38 AM   Specimen: BLOOD RIGHT HAND  Result Value Ref Range Status   Specimen Description BLOOD RIGHT HAND  Final   Special Requests AEROBIC BOTTLE ONLY Blood Culture adequate volume  Final   Culture   Final    NO GROWTH 1 DAY Performed at [facility], [place] [address], [place], [place] [address]    Report Status PENDING  Incomplete  Urine Culture     Status: Abnormal   Collection Time: 11/20/20  4:55 AM   Specimen: Urine, Clean Catch  Result Value Ref Range Status   Specimen Description URINE, CLEAN CATCH  Final   Special Requests   Final    NONE Performed at [facility], [place] [address] [place], [place] [address]    Culture MULTIPLE SPECIES PRESENT, SUGGEST RECOLLECTION (A)  Final   Report Status 11/21/2020 FINAL  Final          Radiology Studies: DG CHEST PORT 1 VIEW  Result Date: 11/20/2020 CLINICAL DATA:  Reason for exam: fever Patient was non verbal throughout the exam. Warm to the touch. Hx of asthma, chf, diabetes, htn. Hx of right heart cath 11/19/2020. EXAM: PORTABLE CHEST - 1 VIEW COMPARISON:  11/16/2020 FINDINGS: Patchy airspace opacities at the left lung base. Relatively low lung volumes with crowding of bronchovascular structures. Heart size upper limits normal. Aortic Atherosclerosis (ICD10-170.0). Blunting of lateral costophrenic angles suggesting small effusions.  No pneumothorax. Vertebral endplate spurring at multiple levels in the mid and lower thoracic spine. IMPRESSION: Patchy left lower lung atelectasis or infiltrate. Electronically Signed   By: Lucrezia Europe M.D.   On: 11/20/2020 07:48        Scheduled Meds:  acetaZOLAMIDE  250 mg Oral BID   allopurinol  200 mg Oral Daily   apixaban  5 mg Oral BID   atorvastatin  40 mg Oral QHS   cefdinir  300 mg Oral Q12H   cholecalciferol  1,000 Units Oral Daily   doxycycline  100 mg Oral Q12H   famotidine  20 mg Oral BID   ferrous sulfate  325 mg Oral Q M,W,F   folic acid  1,423 mcg Oral QHS   gabapentin  100 mg Oral TID   Gerhardt's butt cream   Topical TID   mometasone-formoterol  2 puff Inhalation BID   montelukast  10 mg Oral QHS   nebivolol  5 mg Oral Daily   Oxcarbazepine  300 mg Oral BID   sodium chloride flush  3 mL Intravenous Q12H   sodium chloride flush  3 mL Intravenous Q12H   spironolactone  25 mg Oral Daily   torsemide  80 mg Oral BID   Continuous Infusions:  sodium chloride       LOS: 5 days    Time spent: 35 minutes.     Elmarie Shiley, MD [facility]   If 7PM-7AM, please contact night-coverage [email or web address]  11/21/2020, 4:05 PM

## 2020-11-21 NOTE — Progress Notes (Signed)
RT went to check on pt, pt currently off bipap at this time.

## 2020-11-21 NOTE — Progress Notes (Signed)
VAST consult placed for peripheral access for IV antibiotics. Upon assessment of the patient via ultrasound, with Zenita Kister RN and Heather RN, no suitable veins were visualized for PIV or midline placement. MD Regalado and RN Aminatou notified.

## 2020-11-22 ENCOUNTER — Other Ambulatory Visit: Payer: Medicare PPO

## 2020-11-22 ENCOUNTER — Inpatient Hospital Stay: Payer: Medicare PPO | Admitting: Physician Assistant

## 2020-11-22 ENCOUNTER — Encounter (HOSPITAL_BASED_OUTPATIENT_CLINIC_OR_DEPARTMENT_OTHER): Payer: Medicare PPO | Admitting: Pulmonary Disease

## 2020-11-22 ENCOUNTER — Inpatient Hospital Stay: Payer: Medicare PPO

## 2020-11-22 ENCOUNTER — Ambulatory Visit: Payer: Medicare PPO | Admitting: Physician Assistant

## 2020-11-22 LAB — BASIC METABOLIC PANEL
Anion gap: 9 (ref 5–15)
BUN: 27 mg/dL — ABNORMAL HIGH (ref 8–23)
CO2: 36 mmol/L — ABNORMAL HIGH (ref 22–32)
Calcium: 8.6 mg/dL — ABNORMAL LOW (ref 8.9–10.3)
Chloride: 92 mmol/L — ABNORMAL LOW (ref 98–111)
Creatinine, Ser: 1.48 mg/dL — ABNORMAL HIGH (ref 0.44–1.00)
GFR, Estimated: 38 mL/min — ABNORMAL LOW (ref >60)
Glucose, Bld: 161 mg/dL — ABNORMAL HIGH (ref 70–99)
Potassium: 3.2 mmol/L — ABNORMAL LOW (ref 3.5–5.1)
Sodium: 137 mmol/L (ref 135–145)

## 2020-11-22 LAB — GLUCOSE, CAPILLARY
Glucose-Capillary: 115 mg/dL — ABNORMAL HIGH (ref 70–99)
Glucose-Capillary: 145 mg/dL — ABNORMAL HIGH (ref 70–99)

## 2020-11-22 MED ORDER — CEFDINIR 300 MG PO CAPS: 300.0000 mg | ORAL_CAPSULE | Freq: Two times a day (BID) | ORAL | 0 refills | Status: AC

## 2020-11-22 MED ORDER — ACETAZOLAMIDE 250 MG PO TABS: 250.0000 mg | ORAL_TABLET | Freq: Two times a day (BID) | ORAL | 0 refills | Status: DC

## 2020-11-22 MED ORDER — POTASSIUM CHLORIDE CRYS ER 20 MEQ PO TBCR: 20.0000 meq | EXTENDED_RELEASE_TABLET | Freq: Every day | ORAL | 0 refills | Status: DC

## 2020-11-22 MED ORDER — DOXYCYCLINE HYCLATE 100 MG PO TABS: 100.0000 mg | ORAL_TABLET | Freq: Two times a day (BID) | ORAL | 0 refills | Status: AC

## 2020-11-22 MED ORDER — TORSEMIDE 40 MG PO TABS: 80.0000 mg | ORAL_TABLET | Freq: Two times a day (BID) | ORAL | 1 refills | Status: DC

## 2020-11-22 MED ORDER — ATORVASTATIN CALCIUM 40 MG PO TABS
40.0000 mg | ORAL_TABLET | Freq: Every day | ORAL | 0 refills | Status: DC
Start: 2020-11-22 — End: 2020-11-29

## 2020-11-22 MED ORDER — POTASSIUM CHLORIDE CRYS ER 20 MEQ PO TBCR
20.0000 meq | EXTENDED_RELEASE_TABLET | Freq: Once | ORAL | Status: AC
  Administered 2020-11-22: 20 meq via ORAL
  Filled 2020-11-22: qty 1

## 2020-11-22 MED ORDER — NEBIVOLOL HCL 5 MG PO TABS: 5.0000 mg | ORAL_TABLET | Freq: Every day | ORAL | 3 refills | Status: DC

## 2020-11-22 NOTE — TOC Transition Note (Signed)
Transition of Care [facility]) - CM/SW Discharge Note   Patient Details  Name: April [name] MRN: [id number] Date of Birth: Jun 20, 1950  Transition of Care [facility]) CM/SW Contact:  Zenon Mayo, RN Phone Number: 11/22/2020, 3:56 PM   Clinical Narrative:     Patient is for dc today, [employer] is supplying the NIV for patient and home oxygen.  [facility] is providing HHRN, HHPT, HHOT, HHAIDE.  Soc will begin 24 to 48 hrs post dc.  Patient states her spouse will be taking her home today.  Final next level of care: [place] Barriers to Discharge: No Barriers Identified   Patient Goals and CMS Choice Patient states their goals for this hospitalization and ongoing recovery are:: return home CMS Medicare.gov Compare Post Acute Care list provided to:: Patient Choice offered to / list presented to : Patient  Discharge Placement                       Discharge Plan and Services   Discharge Planning Services: CM Consult Post Acute Care Choice: Durable Medical Equipment, Home Health          DME Arranged: NIV, Oxygen DME Agency: [facility] [employer] Date DME Agency Contacted: 11/22/20 Time DME Agency Contacted: [phone] Representative spoke with at DME Agency: Brenton Grills HH Arranged: RN, PT, OT, Nurse's Aide [place] Agency: Joyce Date [place]: 11/22/20 Time [place]: 1519 Representative spoke with at [place]: Bernice ([place]) Interventions     Readmission Risk Interventions Readmission Risk Prevention Plan 11/22/2020  Transportation Screening Complete  PCP or Specialist Appt within 5-7 Days Complete  Home Care Screening Complete  Medication Review (RN CM) Complete  Some recent data might be hidden

## 2020-11-22 NOTE — TOC CM/SW Note (Signed)
HF TOC CM spoke to pt at bedside. Offered choice for [facility]. States she had [facility] in the past. Contacted [facility] rep, Stacie with new referral. Jonnie Finner RN3 CCM, Heart Failure TOC CM [phone]

## 2020-11-22 NOTE — TOC Progression Note (Addendum)
Transition of Care [facility]) - Progression Note    Patient Details  Name: April [name] MRN: [id number] Date of Birth: 09/10/1950  [facility]) CM/SW Contact  Zenon Mayo, RN Phone Number: 11/22/2020, 10:14 AM  Clinical Narrative:    Thedore Mins with [employer] has not received the approval for the NIV yet, per MD patient can not go home til she gets the approval because she is high risk.   Patient states she does not want to use [employer], because they have been taking money out of her account, NCM asked her what other DME company she wants to use, would [employer] be ok, she said yes.  NCM made referral to [facility] with [employer]. He will be here to see patient shortly.        Expected Discharge Plan and Services           Expected Discharge Date: 11/22/20                                     Social Determinants of Health (SDOH) Interventions    Readmission Risk Interventions No flowsheet data found.

## 2020-11-22 NOTE — TOC Initial Note (Signed)
Transition of Care [facility]) - Initial/Assessment Note    Patient Details  Name: April [name] MRN: [id number] Date of Birth: 1950-08-10  Transition of Care [facility]) CM/SW Contact:    Zenon Mayo, RN Phone Number: 11/22/2020, 3:19 PM  Clinical Narrative:                 Patient is for dc today, [employer] is supplying the NIV for patient and home oxygen.  [facility] is providing HHRN, HHPT, HHOT, HHAIDE.  Soc will begin 24 to 48 hrs post dc.  Patient states her spouse will be taking her home today.    Expected Discharge Plan: [place] Barriers to Discharge: No Barriers Identified   Patient Goals and CMS Choice Patient states their goals for this hospitalization and ongoing recovery are:: return home CMS Medicare.gov Compare Post Acute Care list provided to:: Patient Choice offered to / list presented to : Patient  Expected Discharge Plan and Services Expected Discharge Plan: [place]   Discharge Planning Services: CM Consult Post Acute Care Choice: Durable Medical Equipment, Home Health Living arrangements for the past 2 months: Single Family Home Expected Discharge Date: 11/22/20               DME Arranged: NIV, Oxygen DME Agency: [employer] Date DME Agency Contacted: 11/22/20 Time DME Agency Contacted: [phone] Representative spoke with at DME Agency: Brenton Grills HH Arranged: RN, PT, OT, Nurse's Aide [place] Agency: [place] Date [place]: 11/22/20 Time [place]: 1519 Representative spoke with at [place]: [place]  Prior Living Arrangements/Services Living arrangements for the past 2 months: [place] with:: Spouse Patient language and need for interpreter reviewed:: Yes Do you feel safe going back to the place where you live?: Yes      Need for Family Participation in Patient Care: Yes (Comment) Care giver support system in place?: Yes (comment)   Criminal Activity/Legal  Involvement Pertinent to Current Situation/Hospitalization: No - Comment as needed  Activities of Daily Living      Permission Sought/Granted                  Emotional Assessment Appearance:: Appears stated age Attitude/Demeanor/Rapport: Engaged Affect (typically observed): Appropriate Orientation: : Oriented to Self, Oriented to Place, Oriented to  Time, Oriented to Situation Alcohol / Substance Use: Not Applicable Psych Involvement: No (comment)  Admission diagnosis:  Acute on chronic respiratory failure with hypoxia and hypercapnia (HCC) [T01.60, J96.22] Acute on chronic congestive heart failure, unspecified heart failure type ([place]) [I50.9] Acute on chronic respiratory failure, unspecified whether with hypoxia or hypercapnia (HCC) [J96.20] Patient Active Problem List   Diagnosis Date Noted   Acute on chronic respiratory failure, unspecified whether with hypoxia or hypercapnia ([place]) 11/16/2020   Acute on chronic respiratory failure with hypoxia and hypercapnia ([place]) 11/15/2020   Obesity hypoventilation syndrome ([place]) 11/15/2020   History of DVT (deep vein thrombosis) 11/15/2020   Encounter for chronic pain management 09/05/2020   Cellulitis, leg 05/08/2020   Leg DVT (deep venous thromboembolism), acute, bilateral ([place]) 05/08/2020   Acute respiratory failure with hypoxia and hypercapnia ([place]) 05/08/2020   SOB (shortness of breath)    Gout    Morbid obesity ([place])    Pneumonia due to COVID-19 virus 12/10/2018   COVID-19 virus infection 12/06/2018   Right hip pain 11/09/2018   Osteomyelitis of ankle or foot, acute, left ([place]) 09/07/2018   Osteomyelitis of ankle or foot, left,  acute ([place]) 09/07/2018   Onychomycosis 05/19/2018   Idiopathic chronic venous hypertension of right lower extremity with ulcer and inflammation (HCC) 05/19/2018   Skin lumps 01/14/2018   Bilateral leg cramps 12/14/2017   Left shoulder pain 08/14/2016   Meralgia paresthetica 07/16/2016   Chronic  left-sided low back pain with left-sided sciatica 06/02/2016   Osteopenia 01/11/2016   CKD (chronic kidney disease) stage 3, GFR 30-59 ml/min (HCC) 01/02/2016   Hand paresthesia 07/18/2015   Carpal tunnel syndrome 06/07/2015   Family history of colon cancer    Diabetes mellitus with neurological manifestations ([place]) 04/18/2015   Bilateral leg edema 04/11/2015   Abnormality of gait 01/03/2015   Hereditary and idiopathic peripheral neuropathy 01/03/2015   GERD (gastroesophageal reflux disease) 06/17/2014   Hx of colonic polyps 12/15/2012   Vitamin B12 deficiency 11/03/2012   Intrinsic asthma 03/23/2012   Acute on chronic diastolic CHF (congestive heart failure) ([place]) 02/20/2011   OSA (obstructive sleep apnea) 09/17/2010   CAD, NATIVE VESSEL 11/20/2008   Osteoarthritis of knees, bilateral 06/14/2008   Hyperlipidemia 05/10/2007   Essential hypertension 01/18/2007   Hypokalemia 04/30/2006   HX, PERSONAL, PEPTIC ULCER DISEASE 04/30/2006   PCP:  [name], MD Pharmacy:   [facility] [place], [place] - [phone] [place] [address] Phone: [phone] Fax: [phone]  [facility] [place], [place] [address] Phone: [phone] Fax: [phone]     Social Determinants of Health (SDOH) Interventions    Readmission Risk Interventions Readmission Risk Prevention Plan 11/22/2020  Transportation Screening Complete  PCP or Specialist Appt within 5-7 Days Complete  Home Care Screening Complete  Medication Review (RN CM) Complete  Some recent data might be hidden

## 2020-11-22 NOTE — Progress Notes (Signed)
Physical Therapy Treatment Patient Details Name: April [name] MRN: [id number] DOB: [birth year] Today's Date: 11/22/2020   History of Present Illness Pt is a 70 y.o. female admitted 11/15/2020 from advanced heart failure clinic with hypoxia, weakness, near syncope. Workup for acute on chronic hypoxic/hypercapnic respiratory failure, CHF exacerbation requiring BiPAP. PMH includes diastolic CHF, DM-2, CKD 3, morbid obesity, asthma, OSA not on CPAP, DVT on Eliquis, CAD, ambulatory dysfunction.   PT Comments    Pt slowly progressing with mobility. Today's session focused on transfer training and standing tolerance, pt requiring use of RW with minA to stand. Pt with some self-limiting behaviors, likely exacerbated by fear of falling and SOB. Pt limited by generalized weakness, decreased activity tolerance, and impaired balance strategies/postural reactions. Pt preparing for d/c home this afternoon; if to remain admitted, will continue to follow acutely.  SpO2 >/92% on 2L O2    Recommendations for follow up therapy are one component of a multi-disciplinary discharge planning process, led by the attending physician.  Recommendations may be updated based on patient status, additional functional criteria and insurance authorization.  Follow Up Recommendations  Home health PT;Supervision for mobility/OOB     Equipment Recommendations  None recommended by PT    Recommendations for Other Services       Precautions / Restrictions Precautions Precautions: Fall Restrictions Weight Bearing Restrictions: No     Mobility  Bed Mobility Overal bed mobility: Needs Assistance Bed Mobility: Supine to Sit     Supine to sit: Min assist;HOB elevated     General bed mobility comments: MinA for HHA to elevate trunk    Transfers Overall transfer level: Needs assistance Equipment used: Rolling walker (2 wheeled) Transfers: Sit to/from Stand Sit to Stand: Min assist         General transfer  comment: Increased time and effort, 3x sit<>stand from EOB and recliner to RW, minA for trunk elevation  Ambulation/Gait Ambulation/Gait assistance: Min guard Gait Distance (Feet): 1 Feet Assistive device: Rolling walker (2 wheeled) Gait Pattern/deviations: Step-to pattern;Shuffle;Wide base of support Gait velocity: Decreased   General Gait Details: Pivotal steps from bed to recliner with RW and min guard for balance, short shuffling gait; pt declines further distance secondary to fatigue   Stairs             Wheelchair Mobility    Modified Rankin (Stroke Patients Only)       Balance Overall balance assessment: Needs assistance Sitting-balance support: No upper extremity supported;Feet supported Sitting balance-Leahy Scale: Fair       Standing balance-Leahy Scale: Poor                              Cognition Arousal/Alertness: Awake/alert Behavior During Therapy: WFL for tasks assessed/performed Overall Cognitive Status: Within Functional Limits for tasks assessed                                 General Comments: WFL for simple tasks; self-limiting with mobility progression, suspect related to fear of falling      Exercises      General Comments General comments (skin integrity, edema, etc.): VSS on 2L O2 [place]. Noted tremulous BUEs, pt reports, "My arms shake." Pt preparing for d/c home today, reports having necessary support from husband      Pertinent Vitals/Pain Pain Assessment: Faces Faces Pain Scale: Hurts little more Pain Location: BLEs Pain Descriptors /  Indicators: Sore;Guarding Pain Intervention(s): Monitored during session;Limited activity within patient's tolerance    Home Living                      Prior Function            PT Goals (current goals can now be found in the care plan section) Progress towards PT goals: Progressing toward goals    Frequency    Min 3X/week      PT Plan Current plan  remains appropriate    Co-evaluation              AM-PAC PT "6 Clicks" Mobility   Outcome Measure  Help needed turning from your back to your side while in a flat bed without using bedrails?: A Little Help needed moving from lying on your back to sitting on the side of a flat bed without using bedrails?: A Little Help needed moving to and from a bed to a chair (including a wheelchair)?: A Little Help needed standing up from a chair using your arms (e.g., wheelchair or bedside chair)?: A Little Help needed to walk in hospital room?: A Lot Help needed climbing 3-5 steps with a railing? : Total 6 Click Score: 15    End of Session Equipment Utilized During Treatment: Oxygen Activity Tolerance: Patient tolerated treatment well Patient left: with nursing/sitter in room;in chair;with call bell/phone within reach Nurse Communication: Mobility status PT Visit Diagnosis: Other abnormalities of gait and mobility (R26.89);Muscle weakness (generalized) (M62.81);History of falling (Z91.81)     Time: 8329-1916 PT Time Calculation (min) (ACUTE ONLY): 35 min  Charges:  $Therapeutic Activity: 23-37 mins                     [name], PT, DPT Acute Rehabilitation Services  Pager [phone] Office [place] 11/22/2020, 3:39 PM

## 2020-11-22 NOTE — Progress Notes (Signed)
Patient requiring 2L of O2. At rest patient desats to 84-86% but quickly recovers to 94% on 2L.

## 2020-11-22 NOTE — Plan of Care (Signed)
  Problem: Education: Goal: Knowledge of General Education information will improve Description: Including pain rating scale, medication(s)/side effects and non-pharmacologic comfort measures Outcome: Progressing   Problem: Health Behavior/Discharge Planning: Goal: Ability to manage health-related needs will improve Outcome: Progressing   Problem: Clinical Measurements: Goal: Will remain free from infection Outcome: Progressing Goal: Cardiovascular complication will be avoided Outcome: Progressing   Problem: Nutrition: Goal: Adequate nutrition will be maintained Outcome: Progressing   Problem: Pain Managment: Goal: General experience of comfort will improve Outcome: Progressing   Problem: Safety: Goal: Ability to remain free from injury will improve Outcome: Progressing   Problem: Skin Integrity: Goal: Risk for impaired skin integrity will decrease Outcome: Progressing

## 2020-11-22 NOTE — Discharge Summary (Signed)
Physician Discharge Summary  Babe Anthis MLY:[id number] DOB: 08-24-1950 DOA: 11/15/2020  PCP: [name], MD  Admit date: 11/15/2020 Discharge date: 11/22/2020  Admitted From: Home  Disposition: Home   Recommendations for Outpatient Follow-up:  Follow up with PCP in 1-2 weeks Please obtain BMP/CBC in one week patient on diuretics.    Home Health: yes Equipment/Devices: NIPPV, home oxygen.   Discharge Condition: Stable.  CODE STATUS: Full code Diet recommendation: Heart Healthy / Carb Modified   Brief/Interim Summary: 70 year old with past medical history significant for diastolic heart failure, diabetes type 2, CKD stage IIIa, morbid obesity, asthma, OSA not on CPAP, DVT on Eliquis, CAD and ambulatory dysfunction presents from advanced heart failure clinic with hypoxia 77%, generalized weakness and near syncope.  Patient was admitted for acute on chronic hypoxic hypercapnic respiratory failure and CHF exacerbation requiring BiPAP.  Also she was a started on IV Lasix. Cardiology and PCCM were following.  Patient remains on IV Lasix.  Patient underwent right heart cath on 10/19.  Patient will need NIPPV on discharge. Overnight patient became more lethargic, she has low-grade fever, blood gas showed worsening hypercapnia.  She was placed on BiPAP.  Subsequently she became more  alert.    1-Acute on Chronic Diastolic Heart Failure: -Morbid obesity BMI 62. -Patient received IV Lasix, cardiology following.  Transition to torsemide 10/19 Underwent right heart cath: Mild to moderate pulmonary hypertension, minimally elevated PCWP, normal cardiac output. -Continue with spironolactone, Bystolic. -Back on torsemide. Renal function stable.    2-Acute on chronic respiratory failure with hypoxia, hypercapnia: ABG pH 7.2, PCO2 80.  Patient was placed on BiPAP. Overnight became more lethargic.  PCO2 85.  Placed on BiPAP.  Mental status improved. Will require NIPPV trilogy vent at  discharge until she is able to have a sleep study. CM working on NIPPV. This will be deliver today at home.  Stable for discharge   History of asthma: Continue with nebulizer AKI on CKD 3A: prior cr 1.3--1.5 Monitor on torsemide. Cr stable    History of CAD: Continue with Lipitor History of bilateral DVT: Continue with Eliquis Normocytic anemia: Continue to monitor hemoglobin Mild Thrombocytopenia: Monitor Physical deconditioning debility: PT recommending SNF but patient declined Morbid obesity: Needs lifestyle modification Hypokalemia: Continue with potassium supplementation Low-grade fever: resolved.  urine culture; multiples species.  Chest x-ray with left lower lobe atelectasis or infiltrate.  Started on Doxy and ceftriaxone.  Change ceftriaxone to cefdinir. Complete 2 days at discharge.          Discharge Diagnoses:  Principal Problem:   Acute on chronic respiratory failure with hypoxia and hypercapnia (HCC) Active Problems:   Essential hypertension   CAD, NATIVE VESSEL   Acute on chronic diastolic CHF (congestive heart failure) (HCC)   Diabetes mellitus with neurological manifestations (HCC)   CKD (chronic kidney disease) stage 3, GFR 30-59 ml/min (HCC)   Morbid obesity (HCC)   Obesity hypoventilation syndrome (HCC)   History of DVT (deep vein thrombosis)   Acute on chronic respiratory failure, unspecified whether with hypoxia or hypercapnia [facility])    Discharge Instructions  Discharge Instructions     Diet - low sodium heart healthy   Complete by: As directed    Increase activity slowly   Complete by: As directed       Allergies as of 11/22/2020       Reactions   Penicillins Rash, Other (See Comments)   She was told not to take it anymore. Has patient had a  PCN reaction causing immediate rash, facial/tongue/throat swelling, SOB or lightheadedness with hypotension: Yes Has patient had a PCN reaction causing severe rash involving mucus membranes or skin  necrosis: No Has patient had a PCN reaction that required hospitalization: No Has patient had a PCN reaction occurring within the last 10 years: No If all of the above answers are "NO", then may proceed with Cephalosporin use.'   Shellfish Allergy Anaphylaxis   Sulfonamide Derivatives Anaphylaxis, Other (See Comments)   Hydrochlorothiazide W-triamterene Other (See Comments)   Hypokalemia   Lotensin [benazepril Hcl] Hives   Dipyridamole Other (See Comments)   Unknown reaction   Estrogens Other (See Comments)   Unknown reaction   Hydrochlorothiazide Other (See Comments)   Unknown reaction   Latex Other (See Comments)   Unknown reaction - stated previously during surgery gloves had to be changed, but unsure if it is still an allergy or not.    Metronidazole Other (See Comments)   Unknown reaction   Other Itching   PICKLES   Spironolactone Other (See Comments)   UNSPECIFIED > "kidney problems"   Sulfa Antibiotics Other (See Comments)   Unknown reaction   Torsemide Other (See Comments)   Unknown reaction   Valsartan Other (See Comments)   Unknown reaction   Mustard [allyl Isothiocyanate] Itching        Medication List     STOP taking these medications    carvedilol 12.5 MG tablet Commonly known as: COREG   eszopiclone 1 MG Tabs tablet Commonly known as: LUNESTA   oxyCODONE 5 MG immediate release tablet Commonly known as: Oxy IR/ROXICODONE   simvastatin 40 MG tablet Commonly known as: ZOCOR       TAKE these medications    (feeding supplement) PROSource Plus liquid Take 30 mLs by mouth 2 (two) times daily between meals.   acetaminophen 325 MG tablet Commonly known as: TYLENOL Take 2 tablets (650 mg total) by mouth every 6 (six) hours as needed for moderate pain (or Fever >/= 101).   acetaZOLAMIDE 250 MG tablet Commonly known as: DIAMOX Take 1 tablet (250 mg total) by mouth 2 (two) times daily.   allopurinol 100 MG tablet Commonly known as: ZYLOPRIM Take 2  tablets (200 mg total) by mouth daily.   apixaban 5 MG Tabs tablet Commonly known as: ELIQUIS Take 5 mg by mouth 2 (two) times daily.   atorvastatin 40 MG tablet Commonly known as: LIPITOR Take 1 tablet (40 mg total) by mouth at bedtime.   B-D SINGLE USE SWABS REGULAR Pads UAD   E11.9   budesonide-formoterol 160-4.5 MCG/ACT inhaler Commonly known as: Symbicort one - two inhalations every 12 hours; gargle and spit after use What changed:  how much to take how to take this when to take this additional instructions   calcium carbonate 500 MG chewable tablet Commonly known as: TUMS - dosed in mg elemental calcium Chew 2 tablets by mouth daily as needed for indigestion or heartburn.   cefdinir 300 MG capsule Commonly known as: OMNICEF Take 1 capsule (300 mg total) by mouth every 12 (twelve) hours for 2 days.   cholecalciferol 25 MCG (1000 UNIT) tablet Commonly known as: VITAMIN D3 Take 1 tablet (1,000 Units total) by mouth daily.   colchicine 0.6 MG tablet Take 2 tabs po at onset of gout flare, then one hour later take 1 tab What changed:  how much to take how to take this when to take this reasons to take this additional instructions   cyanocobalamin  1000 MCG/ML injection Commonly known as: (VITAMIN B-12) INJECT 1ML INTO MUSCLE EVERY 30 DAYS What changed:  how much to take how to take this when to take this additional instructions   diclofenac sodium 1 % Gel Commonly known as: VOLTAREN Apply 2 g topically 4 (four) times daily. What changed:  when to take this reasons to take this   doxycycline 100 MG tablet Commonly known as: VIBRA-TABS Take 1 tablet (100 mg total) by mouth every 12 (twelve) hours for 2 days.   famotidine 20 MG tablet Commonly known as: Pepcid Take 1 tablet (20 mg total) by mouth 2 (two) times daily.   FeroSul 325 (65 FE) MG tablet Generic drug: ferrous sulfate TAKE 1 TABLET BY MOUTH EVERY MONDAY, WEDNESDAY, AND FRIDAY What changed:  See the new instructions.   folic acid 983 MCG tablet Commonly known as: FOLVITE Take 1 tablet (400 mcg total) by mouth at bedtime. Take 400 mcg by mouth at bedtime. What changed: additional instructions   gabapentin 100 MG capsule Commonly known as: NEURONTIN TAKE 1 CAPSULE(100 MG) BY MOUTH THREE TIMES DAILY What changed: See the new instructions.   hydrOXYzine 25 MG tablet Commonly known as: ATARAX/VISTARIL Take 1 tablet (25 mg total) by mouth 3 (three) times daily as needed for itching.   montelukast 10 MG tablet Commonly known as: SINGULAIR Take 1 tablet (10 mg total) by mouth at bedtime.   nebivolol 5 MG tablet Commonly known as: BYSTOLIC Take 1 tablet (5 mg total) by mouth daily. Start taking on: November 23, 2020   Oxcarbazepine 300 MG tablet Commonly known as: TRILEPTAL Take 1 tablet (300 mg total) by mouth 2 (two) times daily.   polyethylene glycol 17 g packet Commonly known as: MIRALAX / GLYCOLAX Take 17 g by mouth daily. What changed:  when to take this reasons to take this   potassium chloride SA 20 MEQ tablet Commonly known as: KLOR-CON Take 1 tablet (20 mEq total) by mouth daily. What changed: when to take this   Pulmicort Flexhaler 90 MCG/ACT inhaler Generic drug: Budesonide Inhale 1 puff into the lungs 2 (two) times daily.   senna-docusate 8.6-50 MG tablet Commonly known as: Senokot-S Take 1 tablet by mouth at bedtime as needed for mild constipation.   spironolactone 25 MG tablet Commonly known as: ALDACTONE TAKE 1 TABLET(25 MG) BY MOUTH DAILY What changed: See the new instructions.   Torsemide 40 MG Tabs Take 80 mg by mouth 2 (two) times daily. What changed:  medication strength how much to take when to take this additional instructions   True Metrix Level 1 Low Soln UAD  E11.9   Ventolin HFA 108 (90 Base) MCG/ACT inhaler Generic drug: albuterol Inhale 2 puffs into the lungs every 4 (four) hours as needed for wheezing.                Durable Medical Equipment  (From admission, onward)           Start     Ordered   11/22/20 1241  For home use only DME oxygen  Once       Question Answer Comment  Length of Need 6 Months   Mode or (Route) Nasal cannula   Liters per Minute 2   Oxygen delivery system Gas      11/22/20 1240            Follow-up Information     [name], MD Follow up in 1 week(s).   Specialty: Internal Medicine  Contact information: [place] [address] [phone]         Jolaine Artist, MD Follow up in 1 week(s).   Specialty: Cardiology Contact information: [place] [address] [phone]                Allergies  Allergen Reactions   Penicillins Rash and Other (See Comments)    She was told not to take it anymore. Has patient had a PCN reaction causing immediate rash, facial/tongue/throat swelling, SOB or lightheadedness with hypotension: Yes Has patient had a PCN reaction causing severe rash involving mucus membranes or skin necrosis: No Has patient had a PCN reaction that required hospitalization: No Has patient had a PCN reaction occurring within the last 10 years: No If all of the above answers are "NO", then may proceed with Cephalosporin use.'   Shellfish Allergy Anaphylaxis   Sulfonamide Derivatives Anaphylaxis and Other (See Comments)   Hydrochlorothiazide W-Triamterene Other (See Comments)    Hypokalemia   Lotensin [Benazepril Hcl] Hives   Dipyridamole Other (See Comments)    Unknown reaction   Estrogens Other (See Comments)    Unknown reaction   Hydrochlorothiazide Other (See Comments)    Unknown reaction   Latex Other (See Comments)    Unknown reaction - stated previously during surgery gloves had to be changed, but unsure if it is still an allergy or not.    Metronidazole Other (See Comments)    Unknown reaction   Other Itching    PICKLES   Spironolactone Other (See  Comments)    UNSPECIFIED > "kidney problems"   Sulfa Antibiotics Other (See Comments)    Unknown reaction   Torsemide Other (See Comments)    Unknown reaction   Valsartan Other (See Comments)    Unknown reaction   Madelaine Bhat Isothiocyanate] Itching    Consultations: Cardiology Pulmonologist    Procedures/Studies: DG Chest 2 View  Result Date: 11/15/2020 CLINICAL DATA:  Shortness of breath, chest pain and edema. EXAM: CHEST - 2 VIEW COMPARISON:  May 07, 2020 FINDINGS: Low lung volumes are seen. There is no evidence of acute infiltrate, pleural effusion or pneumothorax. The cardiac silhouette is mildly enlarged. Mild stable prominence of the bilateral perihilar pulmonary vasculature is noted. Moderate severity calcification of the aortic arch is seen. Multilevel degenerative changes seen throughout the thoracic spine. IMPRESSION: Stable cardiomegaly without acute or active cardiopulmonary disease. Electronically Signed   By: Virgina Norfolk M.D.   On: 11/15/2020 15:46   CARDIAC CATHETERIZATION  Result Date: 11/19/2020 Findings: RA = 11 RV = 68/12 PA = 69/22 (39) PCW = 18 Fick cardiac output/index = 7.0/2.8 PVR = 3.0 WU Ao sat = 96% PA sat = 61%, 62% SVC sat 69% PAPi = 4.2 Assessment: 1. Mild to moderate pulmonary HTN 2. Minimally elevated PCWP 3. Normal cardiac output Plan/Discussion: Volume status looks good. Main issue likely OSA/OHS. Glori Bickers, MD 2:46 PM  DG CHEST PORT 1 VIEW  Result Date: 11/20/2020 CLINICAL DATA:  Reason for exam: fever Patient was non verbal throughout the exam. Warm to the touch. Hx of asthma, chf, diabetes, htn. Hx of right heart cath 11/19/2020. EXAM: PORTABLE CHEST - 1 VIEW COMPARISON:  11/16/2020 FINDINGS: Patchy airspace opacities at the left lung base. Relatively low lung volumes with crowding of bronchovascular structures. Heart size upper limits normal. Aortic Atherosclerosis (ICD10-170.0). Blunting of lateral costophrenic angles suggesting  small effusions. No pneumothorax. Vertebral endplate spurring at multiple levels in  the mid and lower thoracic spine. IMPRESSION: Patchy left lower lung atelectasis or infiltrate. Electronically Signed   By: Lucrezia Europe M.D.   On: 11/20/2020 07:48   DG Chest Portable 1 View  Result Date: 11/16/2020 CLINICAL DATA:  Shortness of breath.  CO2 retention EXAM: PORTABLE CHEST 1 VIEW COMPARISON:  11/15/2020 FINDINGS: Cardiomegaly and vascular pedicle widening with hilar vessel congestion. Low lung volumes. No edema, effusion, or pneumothorax. IMPRESSION: Stable from prior.  Low volume chest with vascular congestion. Electronically Signed   By: Jorje Guild M.D.   On: 11/16/2020 07:19   ECHOCARDIOGRAM COMPLETE  Result Date: 11/18/2020    ECHOCARDIOGRAM REPORT   Patient Name:   Natilee PAGE Bolte Date of Exam: 11/18/2020 Medical Rec #:  [id number]         Height:       64.0 in Accession #:    [id number]        Weight:       365.7 lb Date of Birth:  03/28/1950          BSA:          2.529 m Patient Age:    70 years          BP:           130/66 mmHg Patient Gender: F                 HR:           74 bpm. Exam Location:  Inpatient Procedure: 2D Echo, Cardiac Doppler and Color Doppler Indications:    CHF. RVF. OSA  History:        Patient has prior history of Echocardiogram examinations, most                 recent 05/10/2020. CHF, Stroke; Risk Factors:Hypertension,                 Dyslipidemia and OSA.  Sonographer:    Merrie Roof RDCS Referring Phys: [id number] [place]  1. Left ventricular ejection fraction, by estimation, is 65 to 70%. The left ventricle has normal function. The left ventricle has no regional wall motion abnormalities. There is mild left ventricular hypertrophy. Left ventricular diastolic parameters are consistent with Grade I diastolic dysfunction (impaired relaxation). There is the interventricular septum is flattened in systole and diastole, consistent with right ventricular  pressure and volume overload.  2. Right ventricular systolic function is normal. The right ventricular size is mildly enlarged. There is normal pulmonary artery systolic pressure. The estimated right ventricular systolic pressure is 03.4 mmHg.  3. The mitral valve is normal in structure. Mild mitral valve regurgitation. No evidence of mitral stenosis.  4. Poor TR jet signal.  5. The aortic valve is normal in structure. Aortic valve regurgitation is not visualized. No aortic stenosis is present.  6. The inferior vena cava is normal in size with greater than 50% respiratory variability, suggesting right atrial pressure of 3 mmHg. Comparison(s): PA pressure likely underestimated when compared to prior (66 mmHg). FINDINGS  Left Ventricle: Left ventricular ejection fraction, by estimation, is 65 to 70%. The left ventricle has normal function. The left ventricle has no regional wall motion abnormalities. The left ventricular internal cavity size was normal in size. There is  mild left ventricular hypertrophy. The interventricular septum is flattened in systole and diastole, consistent with right ventricular pressure and volume overload. Left ventricular diastolic parameters are consistent with Grade I diastolic dysfunction (impaired relaxation). Right  Ventricle: The right ventricular size is mildly enlarged. No increase in right ventricular wall thickness. Right ventricular systolic function is normal. There is normal pulmonary artery systolic pressure. The tricuspid regurgitant velocity is 2.23  m/s, and with an assumed right atrial pressure of 3 mmHg, the estimated right ventricular systolic pressure is 60.1 mmHg. Left Atrium: Left atrial size was normal in size. Right Atrium: Right atrial size was normal in size. Pericardium: There is no evidence of pericardial effusion. Mitral Valve: The mitral valve is normal in structure. Mild mitral annular calcification. Mild mitral valve regurgitation. No evidence of mitral valve  stenosis. Tricuspid Valve: Poor TR jet signal. The tricuspid valve is normal in structure. Tricuspid valve regurgitation is mild . No evidence of tricuspid stenosis. Aortic Valve: The aortic valve is normal in structure. Aortic valve regurgitation is not visualized. No aortic stenosis is present. Aortic valve mean gradient measures 5.0 mmHg. Aortic valve peak gradient measures 9.1 mmHg. Aortic valve area, by VTI measures 2.11 cm. Pulmonic Valve: The pulmonic valve was normal in structure. Pulmonic valve regurgitation is not visualized. No evidence of pulmonic stenosis. Aorta: The aortic root is normal in size and structure. Venous: The inferior vena cava is normal in size with greater than 50% respiratory variability, suggesting right atrial pressure of 3 mmHg. IAS/Shunts: No atrial level shunt detected by color flow Doppler.  LEFT VENTRICLE PLAX 2D LVIDd:         3.80 cm   Diastology LVIDs:         2.50 cm   LV e' medial:    4.24 cm/s LV PW:         1.30 cm   LV E/e' medial:  18.9 LV IVS:        1.20 cm   LV e' lateral:   7.18 cm/s LVOT diam:     1.90 cm   LV E/e' lateral: 11.2 LV SV:         71 LV SV Index:   28 LVOT Area:     2.84 cm  RIGHT VENTRICLE            IVC RV Basal diam:  4.60 cm    IVC diam: 2.10 cm RV Mid diam:    3.80 cm RV S prime:     9.68 cm/s TAPSE (M-mode): 1.9 cm LEFT ATRIUM           Index        RIGHT ATRIUM           Index LA diam:      3.50 cm 1.38 cm/m   RA Area:     19.40 cm LA Vol (A4C): 53.0 ml 20.96 ml/m  RA Volume:   52.00 ml  20.57 ml/m  AORTIC VALVE AV Area (Vmax):    2.10 cm AV Area (Vmean):   2.05 cm AV Area (VTI):     2.11 cm AV Vmax:           151.00 cm/s AV Vmean:          103.000 cm/s AV VTI:            0.338 m AV Peak Grad:      9.1 mmHg AV Mean Grad:      5.0 mmHg LVOT Vmax:         112.00 cm/s LVOT Vmean:        74.600 cm/s LVOT VTI:          0.251 m LVOT/AV VTI ratio: 0.74  AORTA Ao Root diam: 2.80 cm Ao Asc diam:  2.80 cm MITRAL VALVE               TRICUSPID VALVE  MV Area (PHT): 2.80 cm    TR Peak grad:   19.9 mmHg MV Decel Time: 271 msec    TR Vmax:        223.00 cm/s MV E velocity: 80.20 cm/s MV A velocity: 83.00 cm/s  SHUNTS MV E/A ratio:  0.97        Systemic VTI:  0.25 m                            Systemic Diam: 1.90 cm Candee Furbish MD Electronically signed by Candee Furbish MD Signature Date/Time: 11/18/2020/5:42:12 PM    Final      Subjective: Breathing ok. alert  Discharge Exam: Vitals:   11/22/20 0817 11/22/20 1136  BP:  119/68  Pulse:  83  Resp:  (!) 21  Temp:  98.8 F (37.1 C)  SpO2: 99% 93%     General: Pt is alert, awake, not in acute distress Cardiovascular: RRR, S1/S2 +, no rubs, no gallops Respiratory: CTA bilaterally, no wheezing, no rhonchi Abdominal: Soft, NT, ND, bowel sounds + Extremities: no edema, no cyanosis    The results of significant diagnostics from this hospitalization (including imaging, microbiology, ancillary and laboratory) are listed below for reference.     Microbiology: Recent Results (from the past 240 hour(s))  Resp Panel by RT-PCR (Flu A&B, Covid) Nasopharyngeal Swab     Status: None   Collection Time: 11/15/20 12:50 PM   Specimen: Nasopharyngeal Swab; Nasopharyngeal(NP) swabs in vial transport medium  Result Value Ref Range Status   SARS Coronavirus 2 by RT PCR NEGATIVE NEGATIVE Final    Comment: (NOTE) SARS-CoV-2 target nucleic acids are NOT DETECTED.  The SARS-CoV-2 RNA is generally detectable in upper respiratory specimens during the acute phase of infection. The lowest concentration of SARS-CoV-2 viral copies this assay can detect is 138 copies/mL. A negative result does not preclude SARS-Cov-2 infection and should not be used as the sole basis for treatment or other patient management decisions. A negative result may occur with  improper specimen collection/handling, submission of specimen other than nasopharyngeal swab, presence of viral mutation(s) within the areas targeted by this  assay, and inadequate number of viral copies(<138 copies/mL). A negative result must be combined with clinical observations, patient history, and epidemiological information. The expected result is Negative.  Fact Sheet for Patients:  EntrepreneurPulse.com.au  Fact Sheet for Healthcare Providers:  IncredibleEmployment.be  This test is no t yet approved or cleared by the [place] FDA and  has been authorized for detection and/or diagnosis of SARS-CoV-2 by FDA under an Emergency Use Authorization (EUA). This EUA will remain  in effect (meaning this test can be used) for the duration of the COVID-19 declaration under Section 564(b)(1) of the Act, 21 U.S.C.section 360bbb-3(b)(1), unless the authorization is terminated  or revoked sooner.       Influenza A by PCR NEGATIVE NEGATIVE Final   Influenza B by PCR NEGATIVE NEGATIVE Final    Comment: (NOTE) The Xpert Xpress SARS-CoV-2/FLU/RSV plus assay is intended as an aid in the diagnosis of influenza from Nasopharyngeal swab specimens and should not be used as a sole basis for treatment. Nasal washings and aspirates are unacceptable for Xpert Xpress SARS-CoV-2/FLU/RSV testing.  Fact Sheet for Patients: EntrepreneurPulse.com.au  Fact Sheet for Healthcare Providers:  IncredibleEmployment.be  This test is not yet approved or cleared by the [place] and has been authorized for detection and/or diagnosis of SARS-CoV-2 by FDA under an Emergency Use Authorization (EUA). This EUA will remain in effect (meaning this test can be used) for the duration of the COVID-19 declaration under Section 564(b)(1) of the Act, 21 U.S.C. section 360bbb-3(b)(1), unless the authorization is terminated or revoked.  Performed at [facility], [place] [address] [place], [place] [address]   Culture, blood (routine x 2)     Status: None (Preliminary result)   Collection  Time: 11/20/20  4:38 AM   Specimen: BLOOD  Result Value Ref Range Status   Specimen Description BLOOD RIGHT ANTECUBITAL  Final   Special Requests AEROBIC BOTTLE ONLY Blood Culture adequate volume  Final   Culture   Final    NO GROWTH 2 DAYS Performed at [facility], [place] [address] [place], [place] [address]    Report Status PENDING  Incomplete  Culture, blood (routine x 2)     Status: None (Preliminary result)   Collection Time: 11/20/20  4:38 AM   Specimen: BLOOD RIGHT HAND  Result Value Ref Range Status   Specimen Description BLOOD RIGHT HAND  Final   Special Requests AEROBIC BOTTLE ONLY Blood Culture adequate volume  Final   Culture   Final    NO GROWTH 2 DAYS Performed at [facility], [place] [address], [place], [place] [address]    Report Status PENDING  Incomplete  Urine Culture     Status: Abnormal   Collection Time: 11/20/20  4:55 AM   Specimen: Urine, Clean Catch  Result Value Ref Range Status   Specimen Description URINE, CLEAN CATCH  Final   Special Requests   Final    NONE Performed at [facility], [place] [address] [place], [place] [address]    Culture MULTIPLE SPECIES PRESENT, SUGGEST RECOLLECTION (A)  Final   Report Status 11/21/2020 FINAL  Final     Labs: BNP (last 3 results) Recent Labs    08/14/20 1150 11/15/20 1247 11/18/20 0053  BNP 53.1 129.4* 702.6*   Basic Metabolic Panel: Recent Labs  Lab 11/17/20 0121 11/18/20 0053 11/19/20 0141 11/19/20 1436 11/19/20 1441 11/20/20 0240 11/21/20 0144 11/22/20 0251  NA 136 138 135 140  142 143 131* 137 137  K 4.7 4.0 3.7 3.8  3.6 3.5 3.4* 3.9 3.2*  CL 96* 97* 95*  --   --  88* 94* 92*  CO2 31 34* 33*  --   --  36* 34* 36*  GLUCOSE 119* 128* 145*  --   --  124* 121* 161*  BUN 21 21 20   --   --  19 25* 27*  CREATININE 1.40* 1.39* 1.35*  --   --  1.38* 1.55* 1.48*  CALCIUM 8.2* 8.3* 8.1*  --   --  8.5* 8.6* 8.6*  MG 2.2 2.3 2.3  --   --  2.4  --   --   PHOS 4.5 3.6 3.9  --   --    --   --   --    Liver Function Tests: Recent Labs  Lab 11/17/20 0121 11/18/20 0053 11/19/20 0141  ALBUMIN 2.8* 2.7* 2.7*   No results for input(s): LIPASE, AMYLASE in the last 168 hours. Recent Labs  Lab 11/15/20 1543  AMMONIA <10   CBC: Recent Labs  Lab 11/17/20 0121 11/18/20 0053 11/19/20 0141 11/19/20 1436 11/19/20  1441 11/20/20 0240  WBC 5.9 5.6 5.4  --   --  5.5  HGB 10.9* 10.2* 10.4* 13.3  12.9 12.9 12.4  HCT 37.5 35.1* 35.1* 39.0  38.0 38.0 41.5  MCV 105.0* 104.8* 104.8*  --   --  103.8*  PLT 143* 142* 146*  --   --  156   Cardiac Enzymes: No results for input(s): CKTOTAL, CKMB, CKMBINDEX, TROPONINI in the last 168 hours. BNP: Invalid input(s): POCBNP CBG: Recent Labs  Lab 11/21/20 1128 11/21/20 1603 11/21/20 2140 11/22/20 0619 11/22/20 1132  GLUCAP 167* 102* 104* 115* 145*   D-Dimer No results for input(s): DDIMER in the last 72 hours. Hgb A1c No results for input(s): HGBA1C in the last 72 hours. Lipid Profile No results for input(s): CHOL, HDL, LDLCALC, TRIG, CHOLHDL, LDLDIRECT in the last 72 hours. Thyroid function studies No results for input(s): TSH, T4TOTAL, T3FREE, THYROIDAB in the last 72 hours.  Invalid input(s): FREET3 Anemia work up No results for input(s): VITAMINB12, FOLATE, FERRITIN, TIBC, IRON, RETICCTPCT in the last 72 hours. Urinalysis    Component Value Date/Time   COLORURINE YELLOW 11/20/2020 0455   APPEARANCEUR CLEAR 11/20/2020 0455   LABSPEC 1.006 11/20/2020 0455   PHURINE 8.0 11/20/2020 0455   GLUCOSEU NEGATIVE 11/20/2020 0455   GLUCOSEU NEGATIVE 02/19/2015 1707   HGBUR NEGATIVE 11/20/2020 [place] 11/20/2020 0455   BILIRUBINUR negative 09/05/2013 0832   KETONESUR NEGATIVE 11/20/2020 0455   PROTEINUR NEGATIVE 11/20/2020 0455   UROBILINOGEN 2.0 (A) 02/19/2015 1707   NITRITE POSITIVE (A) 11/20/2020 0455   LEUKOCYTESUR NEGATIVE 11/20/2020 0455   Sepsis Labs Invalid input(s): PROCALCITONIN,  WBC,   LACTICIDVEN Microbiology Recent Results (from the past 240 hour(s))  Resp Panel by RT-PCR (Flu A&B, Covid) Nasopharyngeal Swab     Status: None   Collection Time: 11/15/20 12:50 PM   Specimen: Nasopharyngeal Swab; Nasopharyngeal(NP) swabs in vial transport medium  Result Value Ref Range Status   SARS Coronavirus 2 by RT PCR NEGATIVE NEGATIVE Final    Comment: (NOTE) SARS-CoV-2 target nucleic acids are NOT DETECTED.  The SARS-CoV-2 RNA is generally detectable in upper respiratory specimens during the acute phase of infection. The lowest concentration of SARS-CoV-2 viral copies this assay can detect is 138 copies/mL. A negative result does not preclude SARS-Cov-2 infection and should not be used as the sole basis for treatment or other patient management decisions. A negative result may occur with  improper specimen collection/handling, submission of specimen other than nasopharyngeal swab, presence of viral mutation(s) within the areas targeted by this assay, and inadequate number of viral copies(<138 copies/mL). A negative result must be combined with clinical observations, patient history, and epidemiological information. The expected result is Negative.  Fact Sheet for Patients:  EntrepreneurPulse.com.au  Fact Sheet for Healthcare Providers:  IncredibleEmployment.be  This test is no t yet approved or cleared by the [place] FDA and  has been authorized for detection and/or diagnosis of SARS-CoV-2 by FDA under an Emergency Use Authorization (EUA). This EUA will remain  in effect (meaning this test can be used) for the duration of the COVID-19 declaration under Section 564(b)(1) of the Act, 21 U.S.C.section 360bbb-3(b)(1), unless the authorization is terminated  or revoked sooner.       Influenza A by PCR NEGATIVE NEGATIVE Final   Influenza B by PCR NEGATIVE NEGATIVE Final    Comment: (NOTE) The Xpert Xpress SARS-CoV-2/FLU/RSV plus  assay is intended as an aid in the diagnosis of influenza from Nasopharyngeal  swab specimens and should not be used as a sole basis for treatment. Nasal washings and aspirates are unacceptable for Xpert Xpress SARS-CoV-2/FLU/RSV testing.  Fact Sheet for Patients: EntrepreneurPulse.com.au  Fact Sheet for Healthcare Providers: IncredibleEmployment.be  This test is not yet approved or cleared by the [place] FDA and has been authorized for detection and/or diagnosis of SARS-CoV-2 by FDA under an Emergency Use Authorization (EUA). This EUA will remain in effect (meaning this test can be used) for the duration of the COVID-19 declaration under Section 564(b)(1) of the Act, 21 U.S.C. section 360bbb-3(b)(1), unless the authorization is terminated or revoked.  Performed at [facility], [place] [address], [place], [place] [address]   Culture, blood (routine x 2)     Status: None (Preliminary result)   Collection Time: 11/20/20  4:38 AM   Specimen: BLOOD  Result Value Ref Range Status   Specimen Description BLOOD RIGHT ANTECUBITAL  Final   Special Requests AEROBIC BOTTLE ONLY Blood Culture adequate volume  Final   Culture   Final    NO GROWTH 2 DAYS Performed at [facility], [place] [address] [place], [place] [address]    Report Status PENDING  Incomplete  Culture, blood (routine x 2)     Status: None (Preliminary result)   Collection Time: 11/20/20  4:38 AM   Specimen: BLOOD RIGHT HAND  Result Value Ref Range Status   Specimen Description BLOOD RIGHT HAND  Final   Special Requests AEROBIC BOTTLE ONLY Blood Culture adequate volume  Final   Culture   Final    NO GROWTH 2 DAYS Performed at [facility], [place] [address], [place], [place] [address]    Report Status PENDING  Incomplete  Urine Culture     Status: Abnormal   Collection Time: 11/20/20  4:55 AM   Specimen: Urine, Clean Catch  Result Value Ref Range Status   Specimen  Description URINE, CLEAN CATCH  Final   Special Requests   Final    NONE Performed at [facility], [place] [address], [place], [place] [address]    Culture MULTIPLE SPECIES PRESENT, SUGGEST RECOLLECTION (A)  Final   Report Status 11/21/2020 FINAL  Final     Time coordinating discharge: 40 minutes  SIGNED:   Elmarie Shiley, MD  [facility]

## 2020-11-22 NOTE — Progress Notes (Signed)
PROGRESS NOTE    April [name]  WGY:[id number] DOB: 08/17/1950 DOA: 11/15/2020 PCP: [name], MD   Brief Narrative: 70 year old with past medical history significant for diastolic heart failure, diabetes type 2, CKD stage IIIa, morbid obesity, asthma, OSA not on CPAP, DVT on Eliquis, CAD and ambulatory dysfunction presents from advanced heart failure clinic with hypoxia 77%, generalized weakness and near syncope.  Patient was admitted for acute on chronic hypoxic hypercapnic respiratory failure and CHF exacerbation requiring BiPAP.  Also she was a started on IV Lasix. Cardiology and PCCM were following.  Patient remains on IV Lasix.  Patient underwent right heart cath on 10/19.  Patient will need NIPPV on discharge. Overnight patient became more lethargic, she has low-grade fever, blood gas showed worsening hypercapnia.  She was placed on BiPAP.  Subsequently she became more  alert.   Assessment & Plan:   Principal Problem:   Acute on chronic respiratory failure with hypoxia and hypercapnia (HCC) Active Problems:   Essential hypertension   CAD, NATIVE VESSEL   Acute on chronic diastolic CHF (congestive heart failure) (HCC)   Diabetes mellitus with neurological manifestations (HCC)   CKD (chronic kidney disease) stage 3, GFR 30-59 ml/min (HCC)   Morbid obesity (HCC)   Obesity hypoventilation syndrome (HCC)   History of DVT (deep vein thrombosis)   Acute on chronic respiratory failure, unspecified whether with hypoxia or hypercapnia (HCC)  1-Acute on Chronic Diastolic Heart Failure: -Morbid obesity BMI 62. -Patient received IV Lasix, cardiology following.  Transition to torsemide 10/19 Underwent right heart cath: Mild to moderate pulmonary hypertension, minimally elevated PCWP, normal cardiac output. -Continue with spironolactone, Bystolic. -Back on torsemide. Renal function stable.   2-Acute on chronic respiratory failure with hypoxia, hypercapnia: ABG pH 7.2, PCO2 80.   Patient was placed on BiPAP. Overnight became more lethargic.  PCO2 85.  Placed on BiPAP.  Mental status improved. Will require NIPPV trilogy vent at discharge until she is able to have a sleep study. CM working on NIPPV  History of asthma: Continue with nebulizer AKI on CKD 3A: prior cr 1.3--1.5 Monitor on torsemide.   History of CAD: Continue with Lipitor History of bilateral DVT: Continue with Eliquis Normocytic anemia: Continue to monitor hemoglobin Mild Thrombocytopenia: Monitor Physical deconditioning debility: PT recommending SNF but patient declined Morbid obesity: Needs lifestyle modification Hypokalemia: Continue with potassium supplementation Low-grade fever: resolved.  urine culture; multiples species.  Chest x-ray with left lower lobe atelectasis or infiltrate.  Started on Doxy and ceftriaxone.  Change ceftriaxone to cefdinir.      Nutrition Problem: Increased nutrient needs Etiology: chronic illness (CHF)    Signs/Symptoms: estimated needs    Interventions: Magic cup, Education, Liberalize Diet  Estimated body mass index is 60.47 kg/m as calculated from the following:   Height as of this encounter: 5\' 4"  (1.626 m).   Weight as of this encounter: 159.8 kg.   DVT prophylaxis: Eliquis Code Status: Full code Family Communication: Care discussed with patient Disposition Plan:  Status is: Inpatient  Remains inpatient appropriate because: Awaiting BIPAP for discharge       Consultants:  Cardiology CCM  Procedures:  Right side heart cath; mild to moderate pulmonary hypertension, minimally elevated PCWP, normal cardiac output.  Antimicrobials:    Subjective: Alert, conversant. Denies dyspnea.   Objective: Vitals:   11/22/20 0435 11/22/20 0726 11/22/20 0817 11/22/20 1136  BP: (!) 123/59 (!) 102/54  119/68  Pulse: 74 72  83  Resp: 16 15  (!) 21  Temp: 98.2 F (36.8 C) 98.2 F (36.8 C)  98.8 F (37.1 C)  TempSrc: Oral Oral  Oral  SpO2: 98%  98% 99% 93%  Weight: (!) 159.8 kg     Height:        Intake/Output Summary (Last 24 hours) at 11/22/2020 1241 Last data filed at 11/22/2020 1200 Gross per 24 hour  Intake 894 ml  Output 3825 ml  Net -2931 ml    Filed Weights   11/20/20 0254 11/21/20 0318 11/22/20 0435  Weight: (!) 160.7 kg (!) 159 kg (!) 159.8 kg    Examination:  General exam: Morbid Obesity  Respiratory system:BL air movement Cardiovascular system: S 1, S 2 RRR Gastrointestinal system: BS present, soft, nt Central nervous system: Alert, following command Extremities: no edema    Data Reviewed: I have personally reviewed following labs and imaging studies  CBC: Recent Labs  Lab 11/15/20 1247 11/15/20 1900 11/17/20 0121 11/18/20 0053 11/19/20 0141 11/19/20 1436 11/19/20 1441 11/20/20 0240  WBC 6.0  --  5.9 5.6 5.4  --   --  5.5  NEUTROABS 4.6  --   --   --   --   --   --   --   HGB 12.3   < > 10.9* 10.2* 10.4* 13.3  12.9 12.9 12.4  HCT 41.3   < > 37.5 35.1* 35.1* 39.0  38.0 38.0 41.5  MCV 103.0*  --  105.0* 104.8* 104.8*  --   --  103.8*  PLT 166  --  143* 142* 146*  --   --  156   < > = values in this interval not displayed.    Basic Metabolic Panel: Recent Labs  Lab 11/17/20 0121 11/18/20 0053 11/19/20 0141 11/19/20 1436 11/19/20 1441 11/20/20 0240 11/21/20 0144 11/22/20 0251  NA 136 138 135 140  142 143 131* 137 137  K 4.7 4.0 3.7 3.8  3.6 3.5 3.4* 3.9 3.2*  CL 96* 97* 95*  --   --  88* 94* 92*  CO2 31 34* 33*  --   --  36* 34* 36*  GLUCOSE 119* 128* 145*  --   --  124* 121* 161*  BUN 21 21 20   --   --  19 25* 27*  CREATININE 1.40* 1.39* 1.35*  --   --  1.38* 1.55* 1.48*  CALCIUM 8.2* 8.3* 8.1*  --   --  8.5* 8.6* 8.6*  MG 2.2 2.3 2.3  --   --  2.4  --   --   PHOS 4.5 3.6 3.9  --   --   --   --   --     GFR: Estimated Creatinine Clearance: 54 mL/min (A) (by C-G formula based on SCr of 1.48 mg/dL (H)). Liver Function Tests: Recent Labs  Lab 11/17/20 0121  11/18/20 0053 11/19/20 0141  ALBUMIN 2.8* 2.7* 2.7*    No results for input(s): LIPASE, AMYLASE in the last 168 hours. Recent Labs  Lab 11/15/20 1543  AMMONIA <10    Coagulation Profile: No results for input(s): INR, PROTIME in the last 168 hours. Cardiac Enzymes: No results for input(s): CKTOTAL, CKMB, CKMBINDEX, TROPONINI in the last 168 hours. BNP (last 3 results) No results for input(s): PROBNP in the last 8760 hours. HbA1C: No results for input(s): HGBA1C in the last 72 hours. CBG: Recent Labs  Lab 11/21/20 1128 11/21/20 1603 11/21/20 2140 11/22/20 0619 11/22/20 1132  GLUCAP 167* 102* 104* 115* 145*    Lipid Profile:  No results for input(s): CHOL, HDL, LDLCALC, TRIG, CHOLHDL, LDLDIRECT in the last 72 hours. Thyroid Function Tests: No results for input(s): TSH, T4TOTAL, FREET4, T3FREE, THYROIDAB in the last 72 hours. Anemia Panel: No results for input(s): VITAMINB12, FOLATE, FERRITIN, TIBC, IRON, RETICCTPCT in the last 72 hours.  Sepsis Labs: Recent Labs  Lab 11/20/20 [place] <0.10     Recent Results (from the past 240 hour(s))  Resp Panel by RT-PCR (Flu A&B, Covid) Nasopharyngeal Swab     Status: None   Collection Time: 11/15/20 12:50 PM   Specimen: Nasopharyngeal Swab; Nasopharyngeal(NP) swabs in vial transport medium  Result Value Ref Range Status   SARS Coronavirus 2 by RT PCR NEGATIVE NEGATIVE Final    Comment: (NOTE) SARS-CoV-2 target nucleic acids are NOT DETECTED.  The SARS-CoV-2 RNA is generally detectable in upper respiratory specimens during the acute phase of infection. The lowest concentration of SARS-CoV-2 viral copies this assay can detect is 138 copies/mL. A negative result does not preclude SARS-Cov-2 infection and should not be used as the sole basis for treatment or other patient management decisions. A negative result may occur with  improper specimen collection/handling, submission of specimen other than nasopharyngeal  swab, presence of viral mutation(s) within the areas targeted by this assay, and inadequate number of viral copies(<138 copies/mL). A negative result must be combined with clinical observations, patient history, and epidemiological information. The expected result is Negative.  Fact Sheet for Patients:  EntrepreneurPulse.com.au  Fact Sheet for Healthcare Providers:  IncredibleEmployment.be  This test is no t yet approved or cleared by the [place] FDA and  has been authorized for detection and/or diagnosis of SARS-CoV-2 by FDA under an Emergency Use Authorization (EUA). This EUA will remain  in effect (meaning this test can be used) for the duration of the COVID-19 declaration under Section 564(b)(1) of the Act, 21 U.S.C.section 360bbb-3(b)(1), unless the authorization is terminated  or revoked sooner.       Influenza A by PCR NEGATIVE NEGATIVE Final   Influenza B by PCR NEGATIVE NEGATIVE Final    Comment: (NOTE) The Xpert Xpress SARS-CoV-2/FLU/RSV plus assay is intended as an aid in the diagnosis of influenza from Nasopharyngeal swab specimens and should not be used as a sole basis for treatment. Nasal washings and aspirates are unacceptable for Xpert Xpress SARS-CoV-2/FLU/RSV testing.  Fact Sheet for Patients: EntrepreneurPulse.com.au  Fact Sheet for Healthcare Providers: IncredibleEmployment.be  This test is not yet approved or cleared by the [place] FDA and has been authorized for detection and/or diagnosis of SARS-CoV-2 by FDA under an Emergency Use Authorization (EUA). This EUA will remain in effect (meaning this test can be used) for the duration of the COVID-19 declaration under Section 564(b)(1) of the Act, 21 U.S.C. section 360bbb-3(b)(1), unless the authorization is terminated or revoked.  Performed at [facility], [place] [address], [place], [place] [address]   Culture,  blood (routine x 2)     Status: None (Preliminary result)   Collection Time: 11/20/20  4:38 AM   Specimen: BLOOD  Result Value Ref Range Status   Specimen Description BLOOD RIGHT ANTECUBITAL  Final   Special Requests AEROBIC BOTTLE ONLY Blood Culture adequate volume  Final   Culture   Final    NO GROWTH 1 DAY Performed at [facility], [place] [address] [place], [place] [address]    Report Status PENDING  Incomplete  Culture, blood (routine x 2)     Status: None (Preliminary result)  Collection Time: 11/20/20  4:38 AM   Specimen: BLOOD RIGHT HAND  Result Value Ref Range Status   Specimen Description BLOOD RIGHT HAND  Final   Special Requests AEROBIC BOTTLE ONLY Blood Culture adequate volume  Final   Culture   Final    NO GROWTH 1 DAY Performed at [facility], [place] [address], [place], [place] [address]    Report Status PENDING  Incomplete  Urine Culture     Status: Abnormal   Collection Time: 11/20/20  4:55 AM   Specimen: Urine, Clean Catch  Result Value Ref Range Status   Specimen Description URINE, CLEAN CATCH  Final   Special Requests   Final    NONE Performed at [facility], [place] [address], [place], [place] [address]    Culture MULTIPLE SPECIES PRESENT, SUGGEST RECOLLECTION (A)  Final   Report Status 11/21/2020 FINAL  Final          Radiology Studies: No results found.      Scheduled Meds:  acetaZOLAMIDE  250 mg Oral BID   allopurinol  200 mg Oral Daily   apixaban  5 mg Oral BID   atorvastatin  40 mg Oral QHS   cefdinir  300 mg Oral Q12H   cholecalciferol  1,000 Units Oral Daily   doxycycline  100 mg Oral Q12H   famotidine  20 mg Oral BID   ferrous sulfate  325 mg Oral Q M,W,F   folic acid  9,381 mcg Oral QHS   gabapentin  100 mg Oral TID   Gerhardt's butt cream   Topical TID   mometasone-formoterol  2 puff Inhalation BID   montelukast  10 mg Oral QHS   nebivolol  5 mg Oral Daily   Oxcarbazepine  300 mg Oral BID   sodium chloride  flush  3 mL Intravenous Q12H   spironolactone  25 mg Oral Daily   torsemide  80 mg Oral BID   Continuous Infusions:     LOS: 6 days    Time spent: 35 minutes.     Elmarie Shiley, MD [facility]   If 7PM-7AM, please contact night-coverage [email or web address]  11/22/2020, 12:41 PM

## 2020-11-22 NOTE — Plan of Care (Signed)

## 2020-11-25 LAB — CULTURE, BLOOD (ROUTINE X 2)
Culture: NO GROWTH
Culture: NO GROWTH
Special Requests: ADEQUATE
Special Requests: ADEQUATE

## 2020-11-26 IMAGING — CR DG HIP (WITH OR WITHOUT PELVIS) 2-3V*R*
4 series · 4 of 4 positions shown · non-contrast
Comparison: None.

CLINICAL DATA: Fever and hip pain.

EXAM:
DG HIP (WITH OR WITHOUT PELVIS) 2-3V RIGHT

[w hip lat right]
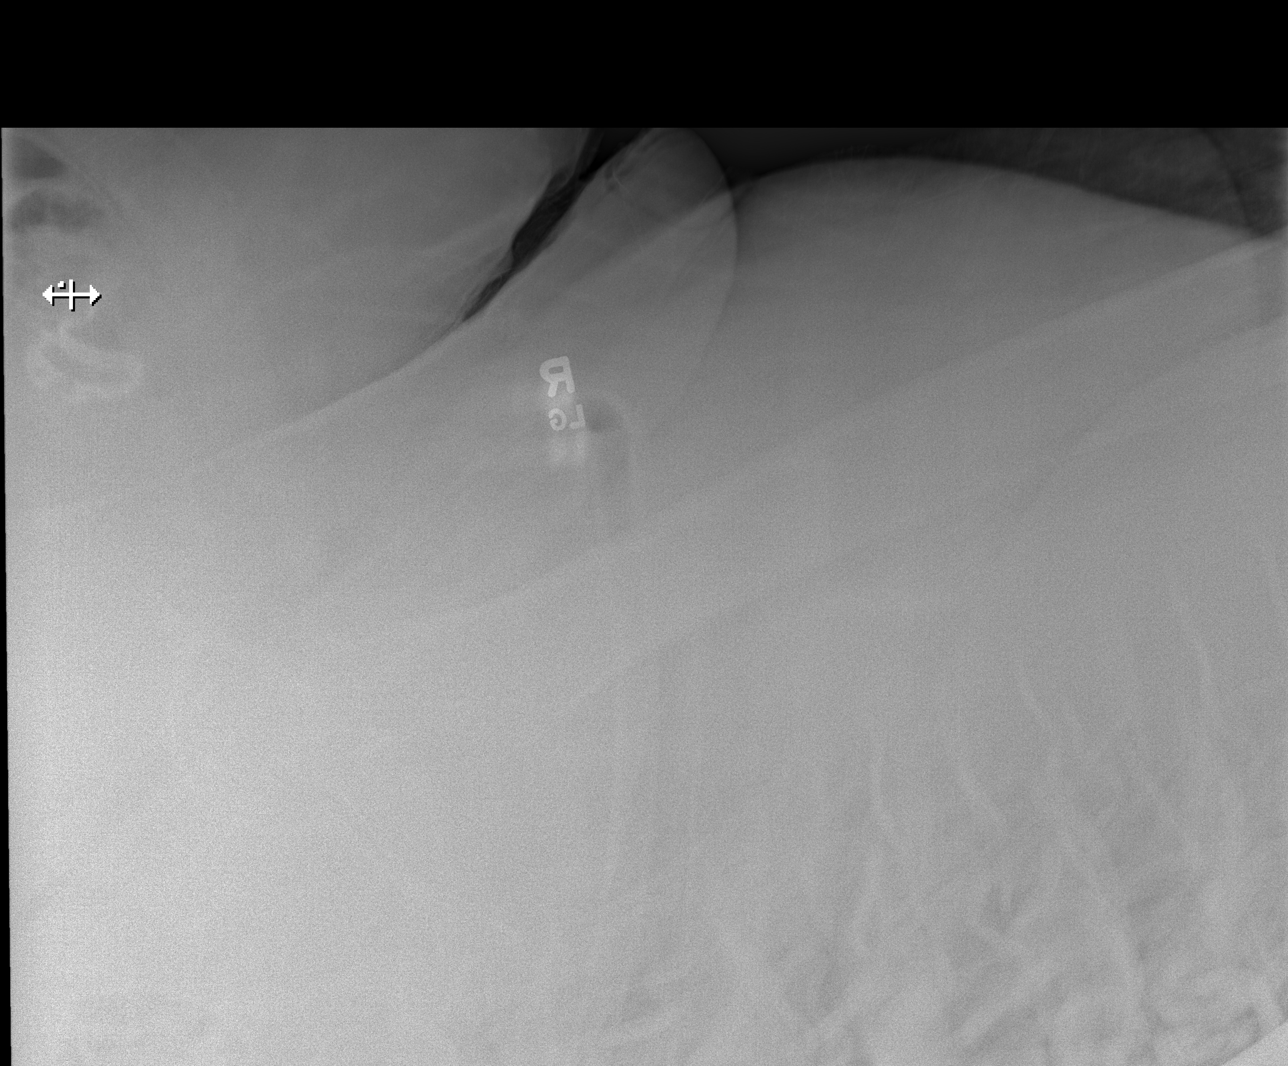

[x pelvis (1 of 2)]
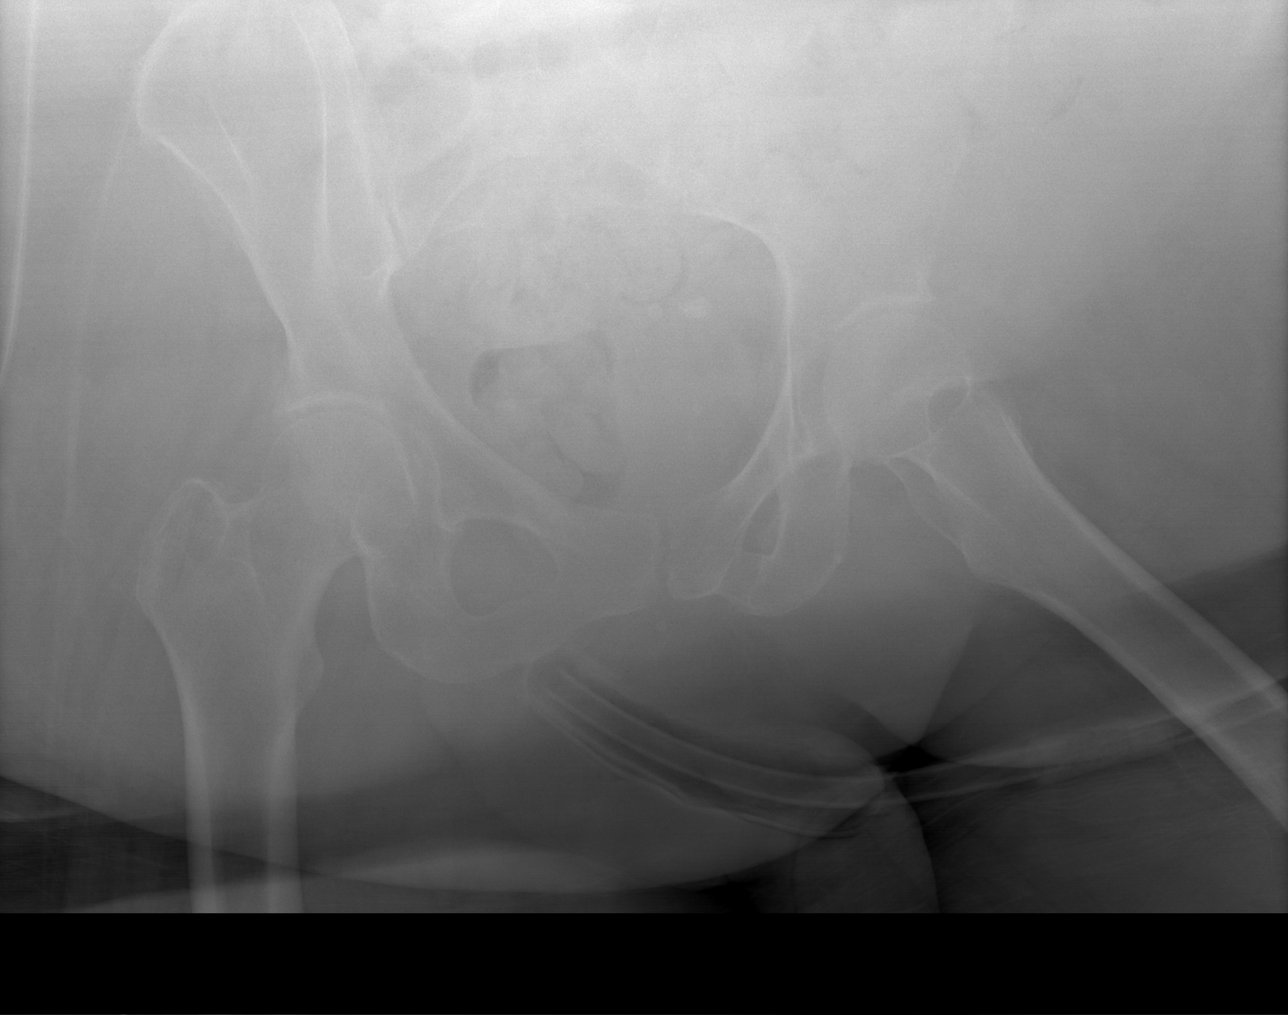

[x pelvis (2 of 2)]
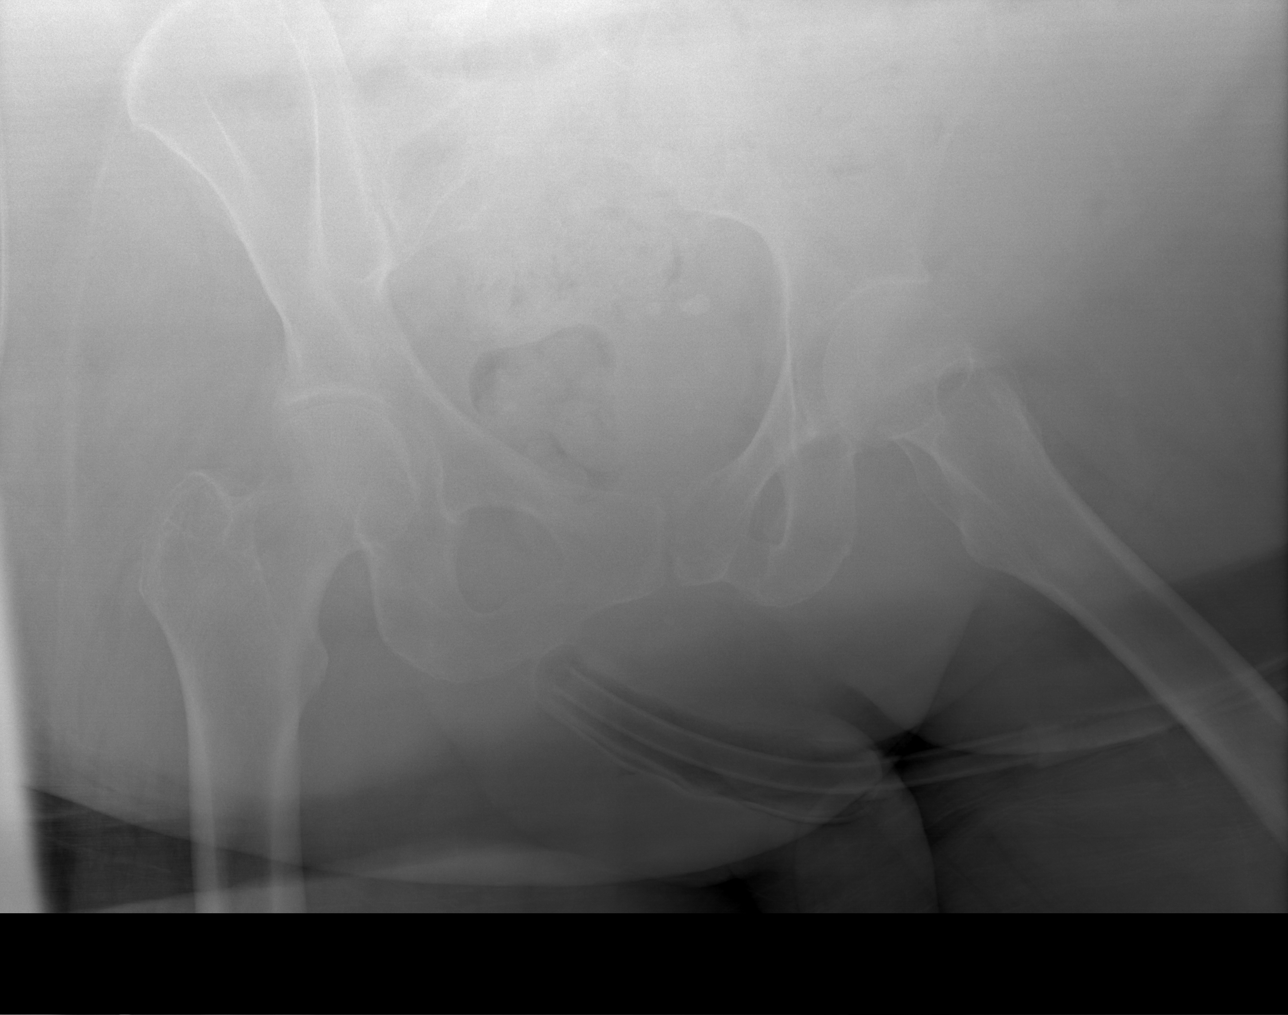

[x hip ap right]
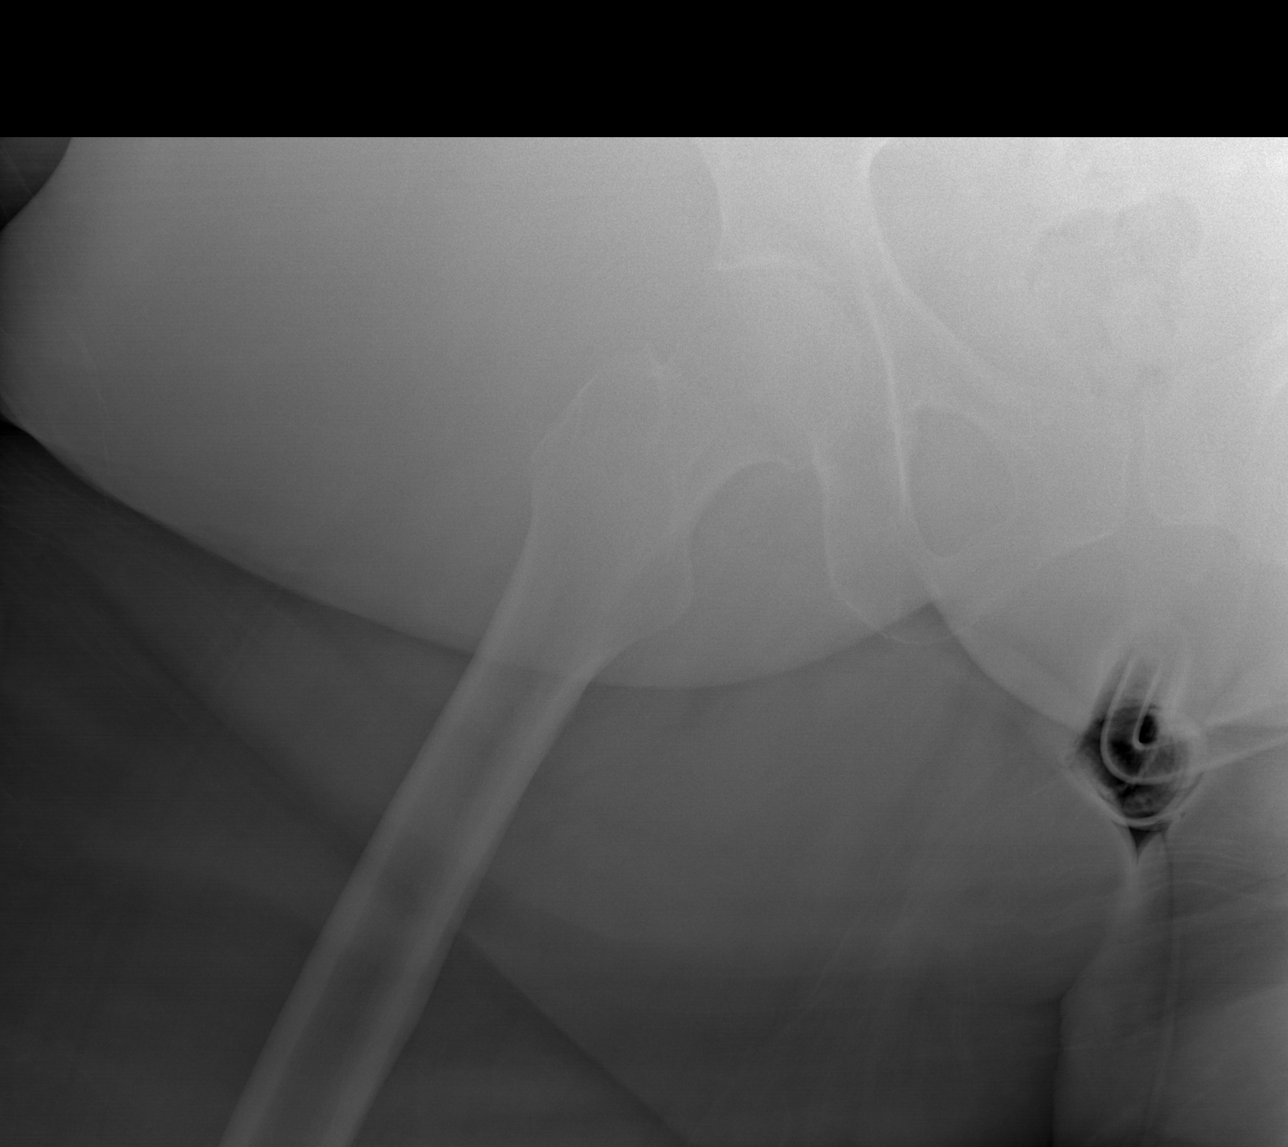

[4 of 4 positions shown; findings below may reference images not displayed]

FINDINGS: There is no evidence of hip fracture or dislocation. There is no
evidence of arthropathy or other focal bone abnormality.
IMPRESSION: Negative.

## 2020-11-27 DIAGNOSIS — N1832 Chronic kidney disease, stage 3b: Secondary | ICD-10-CM | POA: Diagnosis not present

## 2020-11-27 DIAGNOSIS — I130 Hypertensive heart and chronic kidney disease with heart failure and stage 1 through stage 4 chronic kidney disease, or unspecified chronic kidney disease: Secondary | ICD-10-CM | POA: Diagnosis not present

## 2020-11-27 DIAGNOSIS — I5033 Acute on chronic diastolic (congestive) heart failure: Secondary | ICD-10-CM | POA: Diagnosis not present

## 2020-11-27 DIAGNOSIS — D631 Anemia in chronic kidney disease: Secondary | ICD-10-CM | POA: Diagnosis not present

## 2020-11-27 DIAGNOSIS — J45909 Unspecified asthma, uncomplicated: Secondary | ICD-10-CM | POA: Diagnosis not present

## 2020-11-27 DIAGNOSIS — J9621 Acute and chronic respiratory failure with hypoxia: Secondary | ICD-10-CM | POA: Diagnosis not present

## 2020-11-27 DIAGNOSIS — E1122 Type 2 diabetes mellitus with diabetic chronic kidney disease: Secondary | ICD-10-CM | POA: Diagnosis not present

## 2020-11-27 DIAGNOSIS — J9622 Acute and chronic respiratory failure with hypercapnia: Secondary | ICD-10-CM | POA: Diagnosis not present

## 2020-11-27 DIAGNOSIS — I2510 Atherosclerotic heart disease of native coronary artery without angina pectoris: Secondary | ICD-10-CM | POA: Diagnosis not present

## 2020-11-28 ENCOUNTER — Telehealth: Payer: Self-pay | Admitting: Pulmonary Disease

## 2020-11-28 ENCOUNTER — Telehealth: Payer: Self-pay | Admitting: Internal Medicine

## 2020-11-28 DIAGNOSIS — I2510 Atherosclerotic heart disease of native coronary artery without angina pectoris: Secondary | ICD-10-CM | POA: Diagnosis not present

## 2020-11-28 DIAGNOSIS — J45909 Unspecified asthma, uncomplicated: Secondary | ICD-10-CM | POA: Diagnosis not present

## 2020-11-28 DIAGNOSIS — I130 Hypertensive heart and chronic kidney disease with heart failure and stage 1 through stage 4 chronic kidney disease, or unspecified chronic kidney disease: Secondary | ICD-10-CM | POA: Diagnosis not present

## 2020-11-28 DIAGNOSIS — E1122 Type 2 diabetes mellitus with diabetic chronic kidney disease: Secondary | ICD-10-CM | POA: Diagnosis not present

## 2020-11-28 DIAGNOSIS — I5033 Acute on chronic diastolic (congestive) heart failure: Secondary | ICD-10-CM | POA: Diagnosis not present

## 2020-11-28 DIAGNOSIS — J9621 Acute and chronic respiratory failure with hypoxia: Secondary | ICD-10-CM | POA: Diagnosis not present

## 2020-11-28 DIAGNOSIS — J9622 Acute and chronic respiratory failure with hypercapnia: Secondary | ICD-10-CM | POA: Diagnosis not present

## 2020-11-28 DIAGNOSIS — N1832 Chronic kidney disease, stage 3b: Secondary | ICD-10-CM | POA: Diagnosis not present

## 2020-11-28 DIAGNOSIS — D631 Anemia in chronic kidney disease: Secondary | ICD-10-CM | POA: Diagnosis not present

## 2020-11-28 NOTE — Telephone Encounter (Signed)
Called and spoke with pt who states she was in the hospital 10/14-10/21.  Pt said when she was discharged home, pt was sent home on O2. Pt also said that DME came out and got her set up on BIPAP.  Due to pt now being on both O2 and BIPAP, pt wants to know if she needs to still have a sleep study performed. Dr. Jenetta Downer, please advise.

## 2020-11-28 NOTE — Telephone Encounter (Signed)
[place] Name: [facility] Agency Name: Moreen Fowler Phone #: [phone] Service Requested: OT- endurance, balance, increasing safety w/ ADLS & decrease fall risks Frequency of Visits: 1 week 6

## 2020-11-28 NOTE — Progress Notes (Signed)
Subjective:    Patient ID: April [name], female    DOB: 1950-03-20, 70 y.o.   MRN: [id number]  This visit occurred during the SARS-CoV-2 public health emergency.  Safety protocols were in place, including screening questions prior to the visit, additional usage of staff PPE, and extensive cleaning of exam room while observing appropriate contact time as indicated for disinfecting solutions.     HPI The patient is here for follow up from the hospital.  She is here today with her husband.   Admitted 11/15/20 - 11/22/20  She went to the HF clinic and her oxygen was 77%.  She had generalized weakness and near syncope.  She was admitted with acute on chronic hypoxic hpercapnic resp failure and HF exac that required bipap.    Acute on chronic diastolic HF: Received IV lasix, cardio followed Transitioned to torsemide Had right heart cath 10-19 - mild-mod pulm htn, normal EF On bystolic, spironolactone  Acute on chronic resp failure with hypoxia hypercapnia: Placed on bipap Required NIPPV trilogy vent at discharge until able to have sleep study  Low grade fever: Urine cx - multiple species Cxr with LLL atelectasis or infiltrate Started on doxy and ceftriaxone -> ceftriaxone to cefdinir  PT recommended SNF but she declined - has home PT, OT and nursing   She is using the oxygen 24 x 7.  She uses the humidifier at night.  She is having nose bleeds.  The oxygen is irritating during the day.  She is not sure what she can do about the nosebleeds.  She fell the end of sept - was not evaluated.  She is still sore from the fall.  She feels like every time she falls upsets her back.  She is taking all her medications.  She is trying to maintain a low-sodium diet, but she does get takeout a lot because she is not cooking.  She does not add any salt to it.  Her cough has improved.  Her shortness of breath and leg swelling have been stable since coming home from the hospital.  She does  state a little dysuria the end of urination.  She did complete the antibiotic she was discharged home on.   Medications and allergies reviewed with patient and updated if appropriate.  Patient Active Problem List   Diagnosis Date Noted   Acute on chronic respiratory failure with hypoxia and hypercapnia ([place]) 11/15/2020   Obesity hypoventilation syndrome ([place]) 11/15/2020   History of DVT (deep vein thrombosis) 11/15/2020   Encounter for chronic pain management 09/05/2020   Cellulitis, leg 05/08/2020   Leg DVT (deep venous thromboembolism), acute, bilateral ([place]) 05/08/2020   SOB (shortness of breath)    Gout    Morbid obesity ([place])    Pneumonia due to COVID-19 virus 12/10/2018   COVID-19 virus infection 12/06/2018   Right hip pain 11/09/2018   Osteomyelitis of ankle or foot, acute, left ([place]) 09/07/2018   Onychomycosis 05/19/2018   Idiopathic chronic venous hypertension of right lower extremity with ulcer and inflammation ([place]) 05/19/2018   Skin lumps 01/14/2018   Bilateral leg cramps 12/14/2017   Left shoulder pain 08/14/2016   Meralgia paresthetica 07/16/2016   Chronic left-sided low back pain with left-sided sciatica 06/02/2016   Osteopenia 01/11/2016   CKD (chronic kidney disease) stage 3, GFR 30-59 ml/min (HCC) 01/02/2016   Hand paresthesia 07/18/2015   Carpal tunnel syndrome 06/07/2015   Family history of colon cancer    Diabetes mellitus with  neurological manifestations ([place]) 04/18/2015   Bilateral leg edema 04/11/2015   Abnormality of gait 01/03/2015   Hereditary and idiopathic peripheral neuropathy 01/03/2015   GERD (gastroesophageal reflux disease) 06/17/2014   Hx of colonic polyps 12/15/2012   Vitamin B12 deficiency 11/03/2012   Intrinsic asthma 03/23/2012   Acute on chronic diastolic CHF (congestive heart failure) ([place]) 02/20/2011   OSA (obstructive sleep apnea) 09/17/2010   CAD, NATIVE VESSEL 11/20/2008   Osteoarthritis of knees, bilateral 06/14/2008    Hyperlipidemia 05/10/2007   Essential hypertension 01/18/2007   Hypokalemia 04/30/2006   HX, PERSONAL, PEPTIC ULCER DISEASE 04/30/2006    Current Outpatient Medications on File Prior to Visit  Medication Sig Dispense Refill   acetaZOLAMIDE (DIAMOX) 250 MG tablet Take 1 tablet (250 mg total) by mouth 2 (two) times daily. 60 tablet 0   allopurinol (ZYLOPRIM) 100 MG tablet Take 2 tablets (200 mg total) by mouth daily. 180 tablet 1   apixaban (ELIQUIS) 5 MG TABS tablet Take 5 mg by mouth 2 (two) times daily.     atorvastatin (LIPITOR) 40 MG tablet Take 1 tablet (40 mg total) by mouth at bedtime. 30 tablet 0   Blood Glucose Calibration (TRUE METRIX LEVEL 1) Low SOLN UAD  E11.9 1 each 3   Budesonide (PULMICORT FLEXHALER) 90 MCG/ACT inhaler Inhale 1 puff into the lungs 2 (two) times daily.     budesonide-formoterol (SYMBICORT) 160-4.5 MCG/ACT inhaler one - two inhalations every 12 hours; gargle and spit after use (Patient taking differently: Inhale 1-2 puffs into the lungs every 12 (twelve) hours.) 1 Inhaler 4   calcium carbonate (TUMS - DOSED IN MG ELEMENTAL CALCIUM) 500 MG chewable tablet Chew 2 tablets by mouth daily as needed for indigestion or heartburn.     cholecalciferol (VITAMIN D3) 25 MCG (1000 UNIT) tablet Take 1 tablet (1,000 Units total) by mouth daily. 90 tablet 1   colchicine 0.6 MG tablet Take 2 tabs po at onset of gout flare, then one hour later take 1 tab (Patient taking differently: Take 1.2 mg by mouth daily as needed (gout flare).) 30 tablet 1   cyanocobalamin (,VITAMIN B-12,) 1000 MCG/ML injection INJECT 1ML INTO MUSCLE EVERY 30 DAYS (Patient taking differently: Inject 1,000 mcg into the skin every 30 (thirty) days.) 10 mL 5   diclofenac sodium (VOLTAREN) 1 % GEL Apply 2 g topically 4 (four) times daily. (Patient taking differently: Apply 2 g topically 4 (four) times daily as needed (dry skin, rash).) 100 g 11   famotidine (PEPCID) 20 MG tablet Take 1 tablet (20 mg total) by mouth  2 (two) times daily. 180 tablet 3   FEROSUL 325 (65 Fe) MG tablet TAKE 1 TABLET BY MOUTH EVERY MONDAY, WEDNESDAY, AND FRIDAY (Patient taking differently: Take 325 mg by mouth every Monday, Wednesday, and Friday.) 60 tablet 0   folic acid (FOLVITE) 779 MCG tablet Take 1 tablet (400 mcg total) by mouth at bedtime. Take 400 mcg by mouth at bedtime. (Patient taking differently: Take 400 mcg by mouth at bedtime.) 90 tablet 1   gabapentin (NEURONTIN) 100 MG capsule TAKE 1 CAPSULE(100 MG) BY MOUTH THREE TIMES DAILY (Patient taking differently: Take 100 mg by mouth 3 (three) times daily.) 90 capsule 1   montelukast (SINGULAIR) 10 MG tablet Take 1 tablet (10 mg total) by mouth at bedtime. 30 tablet 5   nebivolol (BYSTOLIC) 5 MG tablet Take 1 tablet (5 mg total) by mouth daily. 30 tablet 3   Nutritional Supplements (,FEEDING SUPPLEMENT, PROSOURCE PLUS) liquid  Take 30 mLs by mouth 2 (two) times daily between meals.     Oxcarbazepine (TRILEPTAL) 300 MG tablet Take 1 tablet (300 mg total) by mouth 2 (two) times daily. 180 tablet 1   polyethylene glycol (MIRALAX / GLYCOLAX) 17 g packet Take 17 g by mouth daily. (Patient taking differently: Take 17 g by mouth daily as needed for mild constipation.) 14 each 0   potassium chloride SA (KLOR-CON) 20 MEQ tablet Take 1 tablet (20 mEq total) by mouth daily. 10 tablet 0   senna-docusate (SENOKOT-S) 8.6-50 MG tablet Take 1 tablet by mouth at bedtime as needed for mild constipation. 30 tablet 0   spironolactone (ALDACTONE) 25 MG tablet TAKE 1 TABLET(25 MG) BY MOUTH DAILY (Patient taking differently: Take 25 mg by mouth daily.) 90 tablet 0   torsemide 40 MG TABS Take 80 mg by mouth 2 (two) times daily. 30 tablet 1   VENTOLIN HFA 108 (90 Base) MCG/ACT inhaler Inhale 2 puffs into the lungs every 4 (four) hours as needed for wheezing.     acetaminophen (TYLENOL) 325 MG tablet Take 2 tablets (650 mg total) by mouth every 6 (six) hours as needed for moderate pain (or Fever >/= 101).  20 tablet 0   Alcohol Swabs (B-D SINGLE USE SWABS REGULAR) PADS UAD   E11.9 100 each 3   hydrOXYzine (ATARAX/VISTARIL) 25 MG tablet Take 1 tablet (25 mg total) by mouth 3 (three) times daily as needed for itching. 30 tablet 0   No current facility-administered medications on file prior to visit.    Past Medical History:  Diagnosis Date   Anemia    Asthma    CAD (coronary artery disease)    Carpal tunnel syndrome    Cellulitis of both lower extremities 04/11/2015   CHF (congestive heart failure) (HCC)    Chronic kidney disease    [facility]- followed by [facility]   Colon polyp, hyperplastic 6256 & 3893   Complication of anesthesia 1999   svt with renal calculi surgery, no problems since   CTS (carpal tunnel syndrome)    bilateral   Diabetes mellitus    Eczema    GERD (gastroesophageal reflux disease)    History of kidney stones 1999   Hyperlipidemia    Hypertension    Leg ulcer ([place]) 04/24/2015   Right lateral leg No evidence of an infection Monitor closely Keep edema controlled    Leg ulcer ([place])    right lower   Meralgia paresthetica    Dr. [name]   Morbid obesity ([place])    Neuropathy    toes and legs   Osteoarthrosis, unspecified whether generalized or localized, lower leg    knee   PUD (peptic ulcer disease)    Shortness of breath dyspnea    with exertion   Sleep apnea    per progress note 02/25/2018   Type II or unspecified type diabetes mellitus without mention of complication, not stated as uncontrolled    Unspecified hereditary and idiopathic peripheral neuropathy    Urticaria    Vitamin B12 deficiency    Wears glasses    Wound cellulitis    right upper leg, healing well    Past Surgical History:  Procedure Laterality Date   ABDOMINAL HYSTERECTOMY     BREAST BIOPSY Right 07/08/2018   CARDIAC CATHETERIZATION  2002   non obstructive disease   colonoscopy with polypectomy  2007 & 2012    hyperplastic ;Dr Watt Climes   COLONOSCOPY WITH PROPOFOL N/A 06/04/2015  Procedure: COLONOSCOPY WITH PROPOFOL;  Surgeon: Jerene Bears, MD;  Location: [facility];  Service: Gastroenterology;  Laterality: N/A;   DEBRIDEMENT LEG Right 03/02/2018   WOUND VAC APPLIED   DEBRIDEMENT LEG Right 05/27/2018   RIGHT LOWER LEG DEBRIDEMENT,  SKIN GRAFT, VAC PLACEMENT    DILATION AND CURETTAGE OF UTERUS     multiple   HEMORRHOID SURGERY     I & D EXTREMITY Right 03/02/2018   Procedure: RIGHT LEG DEBRIDEMENT AND PLACE VAC;  Surgeon: [name], MD;  Location: [place];  Service: Orthopedics;  Laterality: Right;   I & D EXTREMITY Right 03/04/2018   Procedure: REPEAT IRRIGATION AND DEBRIDEMENT RIGHT LEG, PLACE WOUND VAC;  Surgeon: [name], MD;  Location: [place];  Service: Orthopedics;  Laterality: Right;   I & D EXTREMITY Right 03/30/2018   Procedure: IRRIGATION AND DEBRIDEMENT RIGHT LEG, APPLY WOUND VAC;  Surgeon: [name], MD;  Location: [place];  Service: Orthopedics;  Laterality: Right;   I & D EXTREMITY Right 05/27/2018   Procedure: RIGHT LOWER LEG DEBRIDEMENT,  SKIN GRAFT, VAC PLACEMENT;  Surgeon: [name], MD;  Location: [place];  Service: Orthopedics;  Laterality: Right;  RIGHT LOWER LEG DEBRIDEMENT,  SKIN GRAFT, VAC PLACEMENT   renal calculi  12/1997   SVT with induction of anesthesia   RIGHT HEART CATH N/A 11/19/2020   Procedure: RIGHT HEART CATH;  Surgeon: Jolaine Artist, MD;  Location: [facility];  Service: Cardiovascular;  Laterality: N/A;   right knee arthroscopy     SKIN SPLIT GRAFT Right 03/04/2018   Procedure: POSSIBLE SPLIT THICKNESS SKIN GRAFT;  Surgeon: [name], MD;  Location: [place];  Service: Orthopedics;  Laterality: Right;   SKIN SPLIT GRAFT Right 04/01/2018   Procedure: REPEAT IRRIGATION AND DEBRIDEMENT RIGHT LEG, APPLY SPLIT THICKNESS SKIN GRAFT;  Surgeon: [name], MD;  Location: [place];  Service: Orthopedics;  Laterality: Right;   TONSILLECTOMY AND ADENOIDECTOMY      Social History   Socioeconomic History    Marital status: Married    Spouse name: Dwight   Number of children: 1   Years of education: BS   Highest education level: Not on file  Occupational History   Occupation: Disabled    Employer: RETIRED  Tobacco Use   Smoking status: Never   Smokeless tobacco: Never  Vaping Use   Vaping Use: Never used  Substance and Sexual Activity   Alcohol use: No    Alcohol/week: 0.0 standard drinks   Drug use: No   Sexual activity: Not on file  Other Topics Concern   Not on file  Social History Narrative   Patient lives at home with her husband [name] Patient is retired and has a Conservation officer, nature.    Caffeine - some times.   Right handed.   Social Determinants of Health   Financial Resource Strain: Not on file  Food Insecurity: Not on file  Transportation Needs: Not on file  Physical Activity: Not on file  Stress: Not on file  Social Connections: Not on file    Family History  Problem Relation Age of Onset   Colon cancer Mother    Prostate cancer Father    Colon cancer Father    Diabetes Maternal Aunt    Breast cancer Maternal Aunt    Diabetes Maternal Uncle    Diabetes Paternal Aunt    Stroke Paternal Aunt        >  80   Heart disease Paternal Aunt    Diabetes Paternal Uncle    Breast cancer Maternal Aunt         X 2   Breast cancer Cousin     Review of Systems  Constitutional:  Negative for chills and fever.  Respiratory:  Positive for cough (mild, improved) and shortness of breath. Negative for wheezing.   Cardiovascular:  Positive for palpitations (occ) and leg swelling. Negative for chest pain.  Genitourinary:  Positive for dysuria (after urinating) and frequency.  Neurological:  Positive for dizziness (if gets up too quick) and headaches. Negative for light-headedness.      Objective:   Vitals:   11/29/20 1048  BP: 134/70  Pulse: 67  Temp: 98 F (36.7 C)  SpO2: 99%   BP Readings from Last 3 Encounters:  11/29/20 134/70  11/22/20 119/68   11/15/20 132/84   Wt Readings from Last 3 Encounters:  11/22/20 (!) 352 lb 4.7 oz (159.8 kg)  10/02/20 (!) 365 lb (165.6 kg)  10/02/20 (!) 365 lb 9.6 oz (165.8 kg)   Body mass index is 60.47 kg/m.   Physical Exam    Constitutional: Appears well-developed and well-nourished. No distress.  HENT:  Head: Normocephalic and atraumatic.  Neck: Neck supple. No tracheal deviation present. No thyromegaly present.  No cervical lymphadenopathy Cardiovascular: Normal rate, regular rhythm and normal heart sounds.  No murmur heard. No carotid bruit .  Trace bilateral lower extremity edema Pulmonary/Chest: Effort normal and breath sounds normal. No respiratory distress. No has no wheezes. No rales. Abdomen: Obese, soft, nontender Skin: Skin is warm and dry. Not diaphoretic.  Psychiatric: Normal mood and affect. Behavior is normal.      Assessment & Plan:    See Problem List for Assessment and Plan of chronic medical problems.

## 2020-11-29 ENCOUNTER — Ambulatory Visit: Payer: Medicare PPO | Admitting: Internal Medicine

## 2020-11-29 ENCOUNTER — Encounter: Payer: Self-pay | Admitting: Internal Medicine

## 2020-11-29 ENCOUNTER — Other Ambulatory Visit: Payer: Self-pay

## 2020-11-29 VITALS — BP 134/70 | HR 67 | Temp 98.0°F | Ht 64.0 in

## 2020-11-29 DIAGNOSIS — J9622 Acute and chronic respiratory failure with hypercapnia: Secondary | ICD-10-CM

## 2020-11-29 DIAGNOSIS — R040 Epistaxis: Secondary | ICD-10-CM | POA: Diagnosis not present

## 2020-11-29 DIAGNOSIS — I10 Essential (primary) hypertension: Secondary | ICD-10-CM | POA: Diagnosis not present

## 2020-11-29 DIAGNOSIS — N1832 Chronic kidney disease, stage 3b: Secondary | ICD-10-CM

## 2020-11-29 DIAGNOSIS — E1140 Type 2 diabetes mellitus with diabetic neuropathy, unspecified: Secondary | ICD-10-CM

## 2020-11-29 DIAGNOSIS — E782 Mixed hyperlipidemia: Secondary | ICD-10-CM | POA: Diagnosis not present

## 2020-11-29 DIAGNOSIS — R300 Dysuria: Secondary | ICD-10-CM | POA: Diagnosis not present

## 2020-11-29 DIAGNOSIS — J9621 Acute and chronic respiratory failure with hypoxia: Secondary | ICD-10-CM

## 2020-11-29 DIAGNOSIS — I5033 Acute on chronic diastolic (congestive) heart failure: Secondary | ICD-10-CM | POA: Diagnosis not present

## 2020-11-29 LAB — CBC WITH DIFFERENTIAL/PLATELET
Basophils Absolute: 0.1 10*3/uL (ref 0.0–0.1)
Basophils Relative: 2.1 % (ref 0.0–3.0)
Eosinophils Absolute: 0.1 10*3/uL (ref 0.0–0.7)
Eosinophils Relative: 1.6 % (ref 0.0–5.0)
HCT: 38.5 % (ref 36.0–46.0)
Hemoglobin: 12 g/dL (ref 12.0–15.0)
Lymphocytes Relative: 18.8 % (ref 12.0–46.0)
Lymphs Abs: 0.8 10*3/uL (ref 0.7–4.0)
MCHC: 31 g/dL (ref 30.0–36.0)
MCV: 98.7 fl (ref 78.0–100.0)
Monocytes Absolute: 0.4 10*3/uL (ref 0.1–1.0)
Monocytes Relative: 8.5 % (ref 3.0–12.0)
Neutro Abs: 3.1 10*3/uL (ref 1.4–7.7)
Neutrophils Relative %: 69 % (ref 43.0–77.0)
Platelets: 153 10*3/uL (ref 150.0–400.0)
RBC: 3.9 Mil/uL (ref 3.87–5.11)
RDW: 16.1 % — ABNORMAL HIGH (ref 11.5–15.5)
WBC: 4.5 10*3/uL (ref 4.0–10.5)

## 2020-11-29 LAB — URINALYSIS, ROUTINE W REFLEX MICROSCOPIC
Bilirubin Urine: NEGATIVE
Hgb urine dipstick: NEGATIVE
Ketones, ur: NEGATIVE
Nitrite: NEGATIVE
Specific Gravity, Urine: <=1.005 — AB (ref 1.000–1.030)
Total Protein, Urine: NEGATIVE
Urine Glucose: NEGATIVE
Urobilinogen, UA: 0.2 (ref 0.0–1.0)
pH: 7 (ref 5.0–8.0)

## 2020-11-29 LAB — COMPREHENSIVE METABOLIC PANEL
ALT: 21 U/L (ref 0–35)
AST: 23 U/L (ref 0–37)
Albumin: 4 g/dL (ref 3.5–5.2)
Alkaline Phosphatase: 99 U/L (ref 39–117)
BUN: 43 mg/dL — ABNORMAL HIGH (ref 6–23)
CO2: 33 mEq/L — ABNORMAL HIGH (ref 19–32)
Calcium: 9.8 mg/dL (ref 8.4–10.5)
Chloride: 100 mEq/L (ref 96–112)
Creatinine, Ser: 1.38 mg/dL — ABNORMAL HIGH (ref 0.40–1.20)
GFR: 38.79 mL/min — ABNORMAL LOW (ref >60.00)
Glucose, Bld: 122 mg/dL — ABNORMAL HIGH (ref 70–99)
Potassium: 3.8 mEq/L (ref 3.5–5.1)
Sodium: 140 mEq/L (ref 135–145)
Total Bilirubin: 0.5 mg/dL (ref 0.2–1.2)
Total Protein: 7.4 g/dL (ref 6.0–8.3)

## 2020-11-29 LAB — LIPID PANEL
Cholesterol: 166 mg/dL (ref 0–200)
HDL: 33.8 mg/dL — ABNORMAL LOW (ref >39.00)
LDL Cholesterol: 111 mg/dL — ABNORMAL HIGH (ref 0–99)
NonHDL: 131.81
Total CHOL/HDL Ratio: 5
Triglycerides: 102 mg/dL (ref 0.0–149.0)
VLDL: 20.4 mg/dL (ref 0.0–40.0)

## 2020-11-29 LAB — HEMOGLOBIN A1C: Hgb A1c MFr Bld: 6.4 % (ref 4.6–6.5)

## 2020-11-29 MED ORDER — ATORVASTATIN CALCIUM 40 MG PO TABS: 40.0000 mg | ORAL_TABLET | Freq: Every day | ORAL | 2 refills | Status: DC

## 2020-11-29 NOTE — Telephone Encounter (Signed)
Patient would like to know if she needs a sleep study. Patient already received CPAP machine thru the hospital. Patient phone number is [phone].

## 2020-11-29 NOTE — Telephone Encounter (Signed)
[place] Name: [facility] Agency Name: Moreen Fowler Phone #: [phone] Service Requested: Skilled Nursing (heart failure) Frequency of Visits: 1 week 5

## 2020-11-29 NOTE — Assessment & Plan Note (Addendum)
Acute Related to oxygen via nasal cannula Advised that she can use saline nasal spray throughout the day and can also try some Vaseline or nasal gel at night Has humidifier in her oxygen for nighttime

## 2020-11-29 NOTE — Assessment & Plan Note (Signed)
Acute on chronic Discharged from the hospital on 2 L oxygen via nasal cannula, which she is compliant with Also on NIPPV at night-to have sleep studies She is monitoring her oxygen and it does drop with ambulation Oxygen level here today is 99% on 2 L oxygen via nasal cannula

## 2020-11-29 NOTE — Patient Instructions (Addendum)
    Blood work was ordered.      Medications changes include :   none     Please followup in 6 months  

## 2020-11-29 NOTE — Assessment & Plan Note (Signed)
Chronic Diet controlled Last A1c was 6.0% Will check A1c today since she is getting blood work done

## 2020-11-29 NOTE — Telephone Encounter (Signed)
[place] Name: [place] Agency Name: Moreen Fowler Phone #: [phone] Service Requested: PT- to assist w/ walking.. being able to get in & out of bed Frequency of Visits: 1 week 9

## 2020-11-29 NOTE — Telephone Encounter (Signed)
ok 

## 2020-11-29 NOTE — Assessment & Plan Note (Signed)
Chronic Diuresed in the hospital Euvolemic here today Has follow-up at the heart failure clinic next week Will check CBC, CMP Continue torsemide 80 mg twice daily, spironolactone 25 mg daily, Bystolic 5 mg daily

## 2020-11-29 NOTE — Telephone Encounter (Signed)
Needs a study to justify that she needs the BiPAP for sleep apnea as well.  Study will also help fine-tune BiPAP pressures

## 2020-11-29 NOTE — Assessment & Plan Note (Signed)
Chronic Has remained stable CMP today Has follow-up with heart failure clinic and nephrology Continue torsemide 80 mg twice daily and potassium supplementation

## 2020-11-29 NOTE — Assessment & Plan Note (Signed)
Chronic BP well controlled Spironolactone 25 mg daily, torsemide 80 mg twice daily

## 2020-11-29 NOTE — Telephone Encounter (Signed)
Spoke to patient and let her what Dr. Lollie Sails recommendations were on the sleep study. Patient states she is confused because she already has all of the equipment she needs at her house brought to her after her [facility]. stay and doesn't know why she needs a HST. We went over Dr. Lollie Sails recommendations again. She voiced concerns of Bipap being taken away if sleep study is not done for insurance purposes. For clarification she is going to call her insurance company. Pt is willing to do a HST if she needs it to keep her Bipap. Pt stated she will call the office Monday to discuss further.

## 2020-11-29 NOTE — Assessment & Plan Note (Signed)
Chronic Regular exercise and healthy diet encouraged Check lipid panel  Continue atorvastatin 40 mg daily 

## 2020-11-29 NOTE — Telephone Encounter (Signed)
Messages left with verbals today.

## 2020-11-29 NOTE — Telephone Encounter (Signed)
Called and spoke with pt and she stated that she called the office back and spoke with Maudie Mercury while she had the insurance person on the line.  Maudie Mercury stated that the insurance person told her that it would be a good idea to get the sleep study done so they will have this for documentation for the machine.    Will route to AO for recs.  Thanks Pt stated that she already has all of the equipment at home.

## 2020-12-01 LAB — URINE CULTURE

## 2020-12-02 NOTE — Telephone Encounter (Signed)
Called and spoke with patient she is scheduled for a split night on 12/06/2020. Nothing further needed at this time.

## 2020-12-03 NOTE — Telephone Encounter (Signed)
noted 

## 2020-12-03 NOTE — Progress Notes (Addendum)
Advanced Heart Failure Clinic Note   PCP: [name], MD PCP-Cardiologist: None  HF Cardiologist: Dr. Haroldine Laws  Reason for visit: f/u for chronic diastolic heart failure   HPI: April [name] is a 70 year old woman with history of morbid obesity Body mass index is 65.23 kg/m., DM2, HTN, HLD, mild-to- moderate CAD, asthma, and chronic dHF.  She underwent catheterization in (2/08), which showed 50-60% LAD lesion and 30% RCA lesion.  EF was 70%.  She had mild pulmonary hypertension, primarily due to high output state.  Her mean pulmonary artery pressure was 27, wedge pressure was 10. PVR was 1.80 Woods units. Her last echo was in 2017 and showed normal LVEF 65-70%, G1DD, RV normal.    Admitted 4/22 for a/c dCHF & acute hypoxic respiratory failure, with likely component of acute asthma exacerbation as well as for management of acute bilateral LE DVT w/ ? Superimposed cellulitis. Treated w/ IV abx and IV Lasix. Echo showed normal LVEF 60-65%. RV normal. She was diuresed and placed back on PO diuretics, switched from lasix 80 bid to torsemide 60 mg d/c. D/c wt 371 lb.   She saw pulmonology for OSA 10/02/20, sleep study ordered, low dose Lunesta added.  Sent to ED 10/22 from clinic and admitted with concerns of CO2 retention and CHF exacerbation. She was placed on BiPap and given IV lasix. Underwent RHC showing mild to moderate pulmonary hypertension, minimally elevated PCWP (18) and normal CO. Pulmonology consulted and arranged for NIPPV at discharge. Carvedilol switched to bystolic. Hospitalization c/b UTI. She declined SNF and was discharged home with [facility] PT/OT. Discharge weight 351 lbs.  Today she returns for post hospital HF follow up with her husband. She can walk to bathroom without SOB and has HH PT/OT. Having some dizziness over the past couple of days. Overall feeling fine. Denies CP, edema, or PND/Orthopnea. Appetite ok. No fever or chills. She is cutting back her salt & fluid intake. Taking  all medications. She is now on 2L oxygen continuously and has sleep study scheduled this week. Wearing her BiPAP for about 2 hours at night.   Cardiac Studies: - RHC (10/22):  RA = 11 RV = 68/12 PA = 69/22 (39) PCW = 18 Fick cardiac output/index = 7.0/2.8 PVR = 3.0 WU Ao sat = 96% PA sat = 61%, 62% SVC sat 69% PAPi = 4.2  Echo (10/22): EF 65-70%, grade I DD, RV ok - Echo 4/22: EF 60-65%. RV ok.   ROS: All systems reviewed and negative except as per HPI.   Past Medical History:  Diagnosis Date   Anemia    Asthma    CAD (coronary artery disease)    Carpal tunnel syndrome    Cellulitis of both lower extremities 04/11/2015   CHF (congestive heart failure) (HCC)    Chronic kidney disease    [facility]- followed by [facility]   Colon polyp, hyperplastic 0254 & 2706   Complication of anesthesia 1999   svt with renal calculi surgery, no problems since   CTS (carpal tunnel syndrome)    bilateral   Diabetes mellitus    Eczema    GERD (gastroesophageal reflux disease)    History of kidney stones 1999   Hyperlipidemia    Hypertension    Leg ulcer ([place]) 04/24/2015   Right lateral leg No evidence of an infection Monitor closely Keep edema controlled    Leg ulcer ([place])    right lower   Meralgia paresthetica    Dr. [name]  Morbid obesity (HCC)    Neuropathy    toes and legs   Osteoarthrosis, unspecified whether generalized or localized, lower leg    knee   PUD (peptic ulcer disease)    Shortness of breath dyspnea    with exertion   Sleep apnea    per progress note 02/25/2018   Type II or unspecified type diabetes mellitus without mention of complication, not stated as uncontrolled    Unspecified hereditary and idiopathic peripheral neuropathy    Urticaria    Vitamin B12 deficiency    Wears glasses    Wound cellulitis    right upper leg, healing well   Current Outpatient Medications  Medication Sig Dispense Refill   acetaminophen (TYLENOL) 325 MG tablet Take 2 tablets  (650 mg total) by mouth every 6 (six) hours as needed for moderate pain (or Fever >/= 101). 20 tablet 0   acetaZOLAMIDE (DIAMOX) 250 MG tablet Take 1 tablet (250 mg total) by mouth 2 (two) times daily. 60 tablet 0   Alcohol Swabs (B-D SINGLE USE SWABS REGULAR) PADS UAD   E11.9 100 each 3   allopurinol (ZYLOPRIM) 100 MG tablet Take 2 tablets (200 mg total) by mouth daily. 180 tablet 1   apixaban (ELIQUIS) 5 MG TABS tablet Take 5 mg by mouth 2 (two) times daily.     atorvastatin (LIPITOR) 40 MG tablet Take 1 tablet (40 mg total) by mouth at bedtime. 90 tablet 2   Blood Glucose Calibration (TRUE METRIX LEVEL 1) Low SOLN UAD  E11.9 1 each 3   Budesonide (PULMICORT FLEXHALER) 90 MCG/ACT inhaler Inhale 1 puff into the lungs 2 (two) times daily.     budesonide-formoterol (SYMBICORT) 160-4.5 MCG/ACT inhaler one - two inhalations every 12 hours; gargle and spit after use 1 Inhaler 4   calcium carbonate (TUMS - DOSED IN MG ELEMENTAL CALCIUM) 500 MG chewable tablet Chew 2 tablets by mouth daily as needed for indigestion or heartburn.     cholecalciferol (VITAMIN D3) 25 MCG (1000 UNIT) tablet Take 1 tablet (1,000 Units total) by mouth daily. 90 tablet 1   COLCHICINE PO Take 1.2 mg by mouth as needed. 2 tablets     cyanocobalamin (,VITAMIN B-12,) 1000 MCG/ML injection INJECT 1ML INTO MUSCLE EVERY 30 DAYS 10 mL 5   diclofenac sodium (VOLTAREN) 1 % GEL Apply 2 g topically 4 (four) times daily. 100 g 11   famotidine (PEPCID) 20 MG tablet Take 1 tablet (20 mg total) by mouth 2 (two) times daily. 180 tablet 3   FEROSUL 325 (65 Fe) MG tablet TAKE 1 TABLET BY MOUTH EVERY MONDAY, WEDNESDAY, AND FRIDAY 60 tablet 0   folic acid (FOLVITE) 818 MCG tablet Take 1 tablet (400 mcg total) by mouth at bedtime. Take 400 mcg by mouth at bedtime. (Patient taking differently: Take 400 mcg by mouth at bedtime. Take 400 mcg by mouth at bedtime.) 90 tablet 1   gabapentin (NEURONTIN) 100 MG capsule TAKE 1 CAPSULE(100 MG) BY MOUTH THREE  TIMES DAILY 90 capsule 1   hydrOXYzine (ATARAX/VISTARIL) 25 MG tablet Take 1 tablet (25 mg total) by mouth 3 (three) times daily as needed for itching. 30 tablet 0   montelukast (SINGULAIR) 10 MG tablet Take 1 tablet (10 mg total) by mouth at bedtime. 30 tablet 5   nebivolol (BYSTOLIC) 5 MG tablet Take 1 tablet (5 mg total) by mouth daily. 30 tablet 3   Nutritional Supplements (,FEEDING SUPPLEMENT, PROSOURCE PLUS) liquid Take 30 mLs by mouth 2 (two)  times daily between meals.     Oxcarbazepine (TRILEPTAL) 300 MG tablet Take 1 tablet (300 mg total) by mouth 2 (two) times daily. 180 tablet 1   polyethylene glycol (MIRALAX / GLYCOLAX) 17 g packet Take 17 g by mouth as needed.     potassium chloride SA (KLOR-CON) 20 MEQ tablet Take 1 tablet (20 mEq total) by mouth daily. 10 tablet 0   senna-docusate (SENOKOT-S) 8.6-50 MG tablet Take 1 tablet by mouth at bedtime as needed for mild constipation. 30 tablet 0   spironolactone (ALDACTONE) 25 MG tablet TAKE 1 TABLET(25 MG) BY MOUTH DAILY 90 tablet 0   torsemide 40 MG TABS Take 80 mg by mouth 2 (two) times daily. 30 tablet 1   VENTOLIN HFA 108 (90 Base) MCG/ACT inhaler Inhale 2 puffs into the lungs every 4 (four) hours as needed for wheezing.     No current facility-administered medications for this encounter.   Allergies  Allergen Reactions   Penicillins Rash and Other (See Comments)    She was told not to take it anymore. Has patient had a PCN reaction causing immediate rash, facial/tongue/throat swelling, SOB or lightheadedness with hypotension: Yes Has patient had a PCN reaction causing severe rash involving mucus membranes or skin necrosis: No Has patient had a PCN reaction that required hospitalization: No Has patient had a PCN reaction occurring within the last 10 years: No If all of the above answers are "NO", then may proceed with Cephalosporin use.'   Shellfish Allergy Anaphylaxis   Sulfonamide Derivatives Anaphylaxis and Other (See Comments)    Hydrochlorothiazide W-Triamterene Other (See Comments)    Hypokalemia   Lotensin [Benazepril Hcl] Hives   Dipyridamole Other (See Comments)    Unknown reaction   Estrogens Other (See Comments)    Unknown reaction   Hydrochlorothiazide Other (See Comments)    Unknown reaction   Latex Other (See Comments)    Unknown reaction - stated previously during surgery gloves had to be changed, but unsure if it is still an allergy or not.    Metronidazole Other (See Comments)    Unknown reaction   Other Itching    PICKLES   Spironolactone Other (See Comments)    UNSPECIFIED > "kidney problems"   Sulfa Antibiotics Other (See Comments)    Unknown reaction   Torsemide Other (See Comments)    Unknown reaction   Valsartan Other (See Comments)    Unknown reaction   Mustard [Allyl Isothiocyanate] Itching   Social History   Socioeconomic History   Marital status: Married    Spouse name: April [name]   Number of children: 1   Years of education: BS   Highest education level: Not on file  Occupational History   Occupation: Disabled    Employer: RETIRED  Tobacco Use   Smoking status: Never   Smokeless tobacco: Never  Vaping Use   Vaping Use: Never used  Substance and Sexual Activity   Alcohol use: No    Alcohol/week: 0.0 standard drinks   Drug use: No   Sexual activity: Not on file  Other Topics Concern   Not on file  Social History Narrative   Patient lives at home with her husband April [name]) . Patient is retired and has a Conservation officer, nature.    Caffeine - some times.   Right handed.   Social Determinants of Health   Financial Resource Strain: Not on file  Food Insecurity: Not on file  Transportation Needs: Not on file  Physical Activity: Not on  file  Stress: Not on file  Social Connections: Not on file  Intimate Partner Violence: Not on file   Family History  Problem Relation Age of Onset   Colon cancer Mother    Prostate cancer Father    Colon cancer Father    Diabetes  Maternal Aunt    Breast cancer Maternal Aunt    Diabetes Maternal Uncle    Diabetes Paternal Aunt    Stroke Paternal Aunt        > 61   Heart disease Paternal Aunt    Diabetes Paternal Uncle    Breast cancer Maternal Aunt         X 2   Breast cancer Cousin    BP (!) 90/50   Pulse 64   Wt (!) 159.6 kg   SpO2 100% Comment: 2 liters n/c  BMI 60.39 kg/m   Wt Readings from Last 3 Encounters:  12/04/20 (!) 159.6 kg  11/22/20 (!) 159.8 kg  10/02/20 (!) 165.6 kg   PHYSICAL EXAM: General:  NAD. No resp difficulty, super morbid obese, arrived in [facility] HEENT: Normal Neck: Supple. JVP difficult. Carotids 2+ bilat; no bruits. No lymphadenopathy or thryomegaly appreciated. Cor: PMI nondisplaced. Regular rate & rhythm. No rubs, gallops or murmurs. Lungs: Clear Abdomen: Obese, nontender, nondistended. No hepatosplenomegaly. No bruits or masses. Good bowel sounds. Extremities: No cyanosis, clubbing, rash, trace edema Neuro: Alert & oriented x 3, cranial nerves grossly intact. Moves all 4 extremities w/o difficulty. Affect pleasant.  ReDs: 22%  ASSESSMENT & PLAN: 1. Chronic Hypoxic Respiratory Failure - Suspect driven by OSA/ OHS  - Continue BiPAP qhs & continue oxygen during the day. - Sleep study has been scheduled. - Needs wt loss but mobility limited by severe knee/hip OA.  - Suspect current symptoms respiratory-driven. - RHC c/w mid-mod PH.    2. Chronic Diastolic Heart Failure  - RHC 2008 c/w mild pulmonary hypertension, primarily due to high output state.  Her mean pulmonary artery pressure was 27, wedge pressure was 10. PVR was 1.80 Woods units. - Echo (2017): LVEF 65-70%, G1DD, RV normal - Recent admit for a/c dCHF. Volume improved w/ IV Lasix.  - Echo (4/22): LVEF 60-65%, G2DD  - Echo (10/22): EF 65%, Grade 1 DD, RV normal. - RHC (10/2): w/ mild-mod PH, minimally elevated PCWP (18) - NYHA III, functional status difficult due to body habitus, asthma/chronic resp failure, and  physical deconditioning. Volume looks good today, ReDs 22%. - Continue torsemide 80 mg bid, may need to scale back pending labs today. - Continue diamox 250 mg bid (recent CO2 36)  - Continue spiro 25 mg daily.  - Continue Bystolic 5 mg daily  - No SGLT2i given risk of GU infection given morbid obesity/ large panus. - BMET today.   3. Bilateral LE DVT (Diagnosed 4/22) - Confirmed by venous dopplers. Chest CT negative for PE. - Now on Eliquis. No bleeding issues. - CBC from 11/29/20 PCP appt reviewed and looks OK.   4. Asthma  - On Symbicort. - Lungs clear on exam today.   5. CAD:  - LHC in 2008 showed 50-60% LAD lesion and 30% RCA lesion. - No chest pain.  - Continue ? blocker and statin. - No ASA given Eliquis for DVT tx.    6. Stage IIIb CKD - Baseline SCr ~1.5. - Followed by Dr. [name] at [facility]. - BMET today.   Follow up with APP in 6 weeks to reassess volume and with Dr. Haroldine Laws in 3  months.  Allena Katz, FNP-BC 12/04/20

## 2020-12-04 ENCOUNTER — Ambulatory Visit (HOSPITAL_COMMUNITY)
Admission: RE | Admit: 2020-12-04 | Discharge: 2020-12-04 | Disposition: A | Discharge status: Home / Self Care | Payer: Medicare PPO | Source: Ambulatory Visit | Attending: Cardiology | Admitting: Cardiology

## 2020-12-04 ENCOUNTER — Encounter (HOSPITAL_COMMUNITY): Payer: Self-pay

## 2020-12-04 ENCOUNTER — Other Ambulatory Visit: Payer: Self-pay

## 2020-12-04 VITALS — BP 90/50 | HR 64 | Wt 351.8 lb

## 2020-12-04 DIAGNOSIS — N1832 Chronic kidney disease, stage 3b: Secondary | ICD-10-CM | POA: Insufficient documentation

## 2020-12-04 DIAGNOSIS — M1710 Unilateral primary osteoarthritis, unspecified knee: Secondary | ICD-10-CM | POA: Diagnosis not present

## 2020-12-04 DIAGNOSIS — I825Z3 Chronic embolism and thrombosis of unspecified deep veins of distal lower extremity, bilateral: Secondary | ICD-10-CM

## 2020-12-04 DIAGNOSIS — I5032 Chronic diastolic (congestive) heart failure: Secondary | ICD-10-CM | POA: Insufficient documentation

## 2020-12-04 DIAGNOSIS — G4733 Obstructive sleep apnea (adult) (pediatric): Secondary | ICD-10-CM | POA: Diagnosis not present

## 2020-12-04 DIAGNOSIS — J45909 Unspecified asthma, uncomplicated: Secondary | ICD-10-CM | POA: Insufficient documentation

## 2020-12-04 DIAGNOSIS — Z888 Allergy status to other drugs, medicaments and biological substances status: Secondary | ICD-10-CM | POA: Insufficient documentation

## 2020-12-04 DIAGNOSIS — I130 Hypertensive heart and chronic kidney disease with heart failure and stage 1 through stage 4 chronic kidney disease, or unspecified chronic kidney disease: Secondary | ICD-10-CM | POA: Diagnosis not present

## 2020-12-04 DIAGNOSIS — E785 Hyperlipidemia, unspecified: Secondary | ICD-10-CM | POA: Insufficient documentation

## 2020-12-04 DIAGNOSIS — Z882 Allergy status to sulfonamides status: Secondary | ICD-10-CM | POA: Diagnosis not present

## 2020-12-04 DIAGNOSIS — Z79899 Other long term (current) drug therapy: Secondary | ICD-10-CM | POA: Diagnosis not present

## 2020-12-04 DIAGNOSIS — M169 Osteoarthritis of hip, unspecified: Secondary | ICD-10-CM | POA: Diagnosis not present

## 2020-12-04 DIAGNOSIS — Z7951 Long term (current) use of inhaled steroids: Secondary | ICD-10-CM | POA: Diagnosis not present

## 2020-12-04 DIAGNOSIS — Z7901 Long term (current) use of anticoagulants: Secondary | ICD-10-CM | POA: Diagnosis not present

## 2020-12-04 DIAGNOSIS — Z8249 Family history of ischemic heart disease and other diseases of the circulatory system: Secondary | ICD-10-CM | POA: Insufficient documentation

## 2020-12-04 DIAGNOSIS — J9611 Chronic respiratory failure with hypoxia: Secondary | ICD-10-CM | POA: Insufficient documentation

## 2020-12-04 DIAGNOSIS — Z881 Allergy status to other antibiotic agents status: Secondary | ICD-10-CM | POA: Diagnosis not present

## 2020-12-04 DIAGNOSIS — I272 Pulmonary hypertension, unspecified: Secondary | ICD-10-CM | POA: Diagnosis not present

## 2020-12-04 DIAGNOSIS — E1122 Type 2 diabetes mellitus with diabetic chronic kidney disease: Secondary | ICD-10-CM | POA: Diagnosis not present

## 2020-12-04 DIAGNOSIS — Z880 Allergy status to penicillin: Secondary | ICD-10-CM | POA: Diagnosis not present

## 2020-12-04 DIAGNOSIS — I82403 Acute embolism and thrombosis of unspecified deep veins of lower extremity, bilateral: Secondary | ICD-10-CM | POA: Insufficient documentation

## 2020-12-04 DIAGNOSIS — I2510 Atherosclerotic heart disease of native coronary artery without angina pectoris: Secondary | ICD-10-CM | POA: Insufficient documentation

## 2020-12-04 DIAGNOSIS — Z6841 Body Mass Index (BMI) 40.0 and over, adult: Secondary | ICD-10-CM | POA: Insufficient documentation

## 2020-12-04 LAB — BASIC METABOLIC PANEL
Anion gap: 8 (ref 5–15)
BUN: 34 mg/dL — ABNORMAL HIGH (ref 8–23)
CO2: 31 mmol/L (ref 22–32)
Calcium: 9.3 mg/dL (ref 8.9–10.3)
Chloride: 99 mmol/L (ref 98–111)
Creatinine, Ser: 1.62 mg/dL — ABNORMAL HIGH (ref 0.44–1.00)
GFR, Estimated: 34 mL/min — ABNORMAL LOW (ref >60)
Glucose, Bld: 152 mg/dL — ABNORMAL HIGH (ref 70–99)
Potassium: 3.7 mmol/L (ref 3.5–5.1)
Sodium: 138 mmol/L (ref 135–145)

## 2020-12-04 NOTE — Progress Notes (Signed)
ReDS Vest / Clip - 12/04/20 1110       ReDS Vest / Clip   Station Marker B    Ruler Value 45    ReDS Value Range Low volume    ReDS Actual Value 22

## 2020-12-04 NOTE — Patient Instructions (Addendum)
Labs were done today, if any labs are abnormal the clinic will call you  PLEASE keep your fluid intake to 64 ounces which is 2 liters  Your physician recommends that you schedule a follow-up appointment in: 6 weeks and then in 12 weeks  At the [facility], you and your health needs are our priority. As part of our continuing mission to provide you with exceptional heart care, we have created designated Provider Care Teams. These Care Teams include your primary Cardiologist (physician) and Advanced Practice Providers (APPs- Physician Assistants and Nurse Practitioners) who all work together to provide you with the care you need, when you need it.   You may see any of the following providers on your designated Care Team at your next follow up: Dr Glori Bickers Dr Haynes Kerns, NP Lyda Jester, [place] [facility] [place], [place] Audry Riles, PharmD   Please be sure to bring in all your medications bottles to every appointment.   If you have any questions or concerns before your next appointment please send [place] a message through [place] or call our office at [phone].    TO LEAVE A MESSAGE FOR THE NURSE SELECT OPTION 2, PLEASE LEAVE A MESSAGE INCLUDING: YOUR NAME DATE OF BIRTH CALL BACK NUMBER REASON FOR CALL**this is important as we prioritize the call backs  YOU WILL RECEIVE A CALL BACK THE SAME DAY AS LONG AS YOU CALL BEFORE 4:00 PM

## 2020-12-05 ENCOUNTER — Telehealth (HOSPITAL_COMMUNITY): Payer: Self-pay

## 2020-12-05 DIAGNOSIS — I5032 Chronic diastolic (congestive) heart failure: Secondary | ICD-10-CM

## 2020-12-05 DIAGNOSIS — M25562 Pain in left knee: Secondary | ICD-10-CM | POA: Diagnosis not present

## 2020-12-05 DIAGNOSIS — M1711 Unilateral primary osteoarthritis, right knee: Secondary | ICD-10-CM | POA: Diagnosis not present

## 2020-12-05 MED ORDER — TORSEMIDE 40 MG PO TABS: ORAL_TABLET | ORAL | 2 refills | Status: DC

## 2020-12-05 NOTE — Telephone Encounter (Signed)
Pt aware of results and instructions. Verbalized understanding. Pt will rtc next week for repeat lab work.

## 2020-12-05 NOTE — Telephone Encounter (Signed)
-----   Message from Rafael Bihari, [place] sent at 12/04/2020  5:19 PM EDT ----- Please stop Diamox as well.

## 2020-12-06 ENCOUNTER — Ambulatory Visit (HOSPITAL_BASED_OUTPATIENT_CLINIC_OR_DEPARTMENT_OTHER): Payer: Medicare PPO | Attending: Pulmonary Disease | Admitting: Pulmonary Disease

## 2020-12-06 ENCOUNTER — Other Ambulatory Visit: Payer: Self-pay

## 2020-12-06 DIAGNOSIS — D631 Anemia in chronic kidney disease: Secondary | ICD-10-CM | POA: Diagnosis not present

## 2020-12-06 DIAGNOSIS — I5033 Acute on chronic diastolic (congestive) heart failure: Secondary | ICD-10-CM | POA: Diagnosis not present

## 2020-12-06 DIAGNOSIS — I130 Hypertensive heart and chronic kidney disease with heart failure and stage 1 through stage 4 chronic kidney disease, or unspecified chronic kidney disease: Secondary | ICD-10-CM | POA: Diagnosis not present

## 2020-12-06 DIAGNOSIS — E1122 Type 2 diabetes mellitus with diabetic chronic kidney disease: Secondary | ICD-10-CM | POA: Diagnosis not present

## 2020-12-06 DIAGNOSIS — J9622 Acute and chronic respiratory failure with hypercapnia: Secondary | ICD-10-CM | POA: Diagnosis not present

## 2020-12-06 DIAGNOSIS — N1832 Chronic kidney disease, stage 3b: Secondary | ICD-10-CM | POA: Diagnosis not present

## 2020-12-06 DIAGNOSIS — G4733 Obstructive sleep apnea (adult) (pediatric): Secondary | ICD-10-CM | POA: Diagnosis not present

## 2020-12-06 DIAGNOSIS — I2510 Atherosclerotic heart disease of native coronary artery without angina pectoris: Secondary | ICD-10-CM | POA: Diagnosis not present

## 2020-12-06 DIAGNOSIS — J9621 Acute and chronic respiratory failure with hypoxia: Secondary | ICD-10-CM | POA: Diagnosis not present

## 2020-12-06 DIAGNOSIS — J45909 Unspecified asthma, uncomplicated: Secondary | ICD-10-CM | POA: Diagnosis not present

## 2020-12-09 ENCOUNTER — Other Ambulatory Visit: Payer: Self-pay | Admitting: Physician Assistant

## 2020-12-09 DIAGNOSIS — I82403 Acute embolism and thrombosis of unspecified deep veins of lower extremity, bilateral: Secondary | ICD-10-CM

## 2020-12-10 ENCOUNTER — Inpatient Hospital Stay: Payer: Medicare PPO | Admitting: Physician Assistant

## 2020-12-10 ENCOUNTER — Inpatient Hospital Stay: Payer: Medicare PPO | Attending: Hematology

## 2020-12-10 ENCOUNTER — Other Ambulatory Visit: Payer: Self-pay

## 2020-12-10 VITALS — BP 129/66 | HR 63 | Temp 97.7°F | Resp 18

## 2020-12-10 DIAGNOSIS — Z7901 Long term (current) use of anticoagulants: Secondary | ICD-10-CM | POA: Insufficient documentation

## 2020-12-10 DIAGNOSIS — I130 Hypertensive heart and chronic kidney disease with heart failure and stage 1 through stage 4 chronic kidney disease, or unspecified chronic kidney disease: Secondary | ICD-10-CM | POA: Diagnosis not present

## 2020-12-10 DIAGNOSIS — D696 Thrombocytopenia, unspecified: Secondary | ICD-10-CM | POA: Insufficient documentation

## 2020-12-10 DIAGNOSIS — Z86718 Personal history of other venous thrombosis and embolism: Secondary | ICD-10-CM | POA: Diagnosis not present

## 2020-12-10 DIAGNOSIS — N189 Chronic kidney disease, unspecified: Secondary | ICD-10-CM | POA: Insufficient documentation

## 2020-12-10 DIAGNOSIS — I82403 Acute embolism and thrombosis of unspecified deep veins of lower extremity, bilateral: Secondary | ICD-10-CM | POA: Diagnosis not present

## 2020-12-10 DIAGNOSIS — D649 Anemia, unspecified: Secondary | ICD-10-CM | POA: Diagnosis not present

## 2020-12-10 LAB — CBC WITH DIFFERENTIAL (CANCER CENTER ONLY)
Abs Immature Granulocytes: 0.02 10*3/uL (ref 0.00–0.07)
Basophils Absolute: 0.1 10*3/uL (ref 0.0–0.1)
Basophils Relative: 1 %
Eosinophils Absolute: 0.1 10*3/uL (ref 0.0–0.5)
Eosinophils Relative: 2 %
HCT: 37.3 % (ref 36.0–46.0)
Hemoglobin: 11.4 g/dL — ABNORMAL LOW (ref 12.0–15.0)
Immature Granulocytes: 0 %
Lymphocytes Relative: 22 %
Lymphs Abs: 1.2 10*3/uL (ref 0.7–4.0)
MCH: 30.2 pg (ref 26.0–34.0)
MCHC: 30.6 g/dL (ref 30.0–36.0)
MCV: 98.7 fL (ref 80.0–100.0)
Monocytes Absolute: 0.5 10*3/uL (ref 0.1–1.0)
Monocytes Relative: 8 %
Neutro Abs: 3.8 10*3/uL (ref 1.7–7.7)
Neutrophils Relative %: 67 %
Platelet Count: 111 10*3/uL — ABNORMAL LOW (ref 150–400)
RBC: 3.78 MIL/uL — ABNORMAL LOW (ref 3.87–5.11)
RDW: 15.3 % (ref 11.5–15.5)
WBC Count: 5.7 10*3/uL (ref 4.0–10.5)
nRBC: 0 % (ref 0.0–0.2)

## 2020-12-10 LAB — CMP (CANCER CENTER ONLY)
ALT: 19 U/L (ref 0–44)
AST: 17 U/L (ref 15–41)
Albumin: 3.5 g/dL (ref 3.5–5.0)
Alkaline Phosphatase: 102 U/L (ref 38–126)
Anion gap: 8 (ref 5–15)
BUN: 26 mg/dL — ABNORMAL HIGH (ref 8–23)
CO2: 28 mmol/L (ref 22–32)
Calcium: 8.8 mg/dL — ABNORMAL LOW (ref 8.9–10.3)
Chloride: 102 mmol/L (ref 98–111)
Creatinine: 1.28 mg/dL — ABNORMAL HIGH (ref 0.44–1.00)
GFR, Estimated: 45 mL/min — ABNORMAL LOW (ref >60)
Glucose, Bld: 127 mg/dL — ABNORMAL HIGH (ref 70–99)
Potassium: 4 mmol/L (ref 3.5–5.1)
Sodium: 138 mmol/L (ref 135–145)
Total Bilirubin: 0.6 mg/dL (ref 0.3–1.2)
Total Protein: 6.6 g/dL (ref 6.5–8.1)

## 2020-12-11 DIAGNOSIS — J45909 Unspecified asthma, uncomplicated: Secondary | ICD-10-CM | POA: Diagnosis not present

## 2020-12-11 DIAGNOSIS — I130 Hypertensive heart and chronic kidney disease with heart failure and stage 1 through stage 4 chronic kidney disease, or unspecified chronic kidney disease: Secondary | ICD-10-CM | POA: Diagnosis not present

## 2020-12-11 DIAGNOSIS — E1122 Type 2 diabetes mellitus with diabetic chronic kidney disease: Secondary | ICD-10-CM | POA: Diagnosis not present

## 2020-12-11 DIAGNOSIS — N1832 Chronic kidney disease, stage 3b: Secondary | ICD-10-CM | POA: Diagnosis not present

## 2020-12-11 DIAGNOSIS — I2510 Atherosclerotic heart disease of native coronary artery without angina pectoris: Secondary | ICD-10-CM | POA: Diagnosis not present

## 2020-12-11 DIAGNOSIS — J9621 Acute and chronic respiratory failure with hypoxia: Secondary | ICD-10-CM | POA: Diagnosis not present

## 2020-12-11 DIAGNOSIS — D631 Anemia in chronic kidney disease: Secondary | ICD-10-CM | POA: Diagnosis not present

## 2020-12-11 DIAGNOSIS — J9622 Acute and chronic respiratory failure with hypercapnia: Secondary | ICD-10-CM | POA: Diagnosis not present

## 2020-12-11 DIAGNOSIS — I5033 Acute on chronic diastolic (congestive) heart failure: Secondary | ICD-10-CM | POA: Diagnosis not present

## 2020-12-12 ENCOUNTER — Telehealth: Payer: Self-pay | Admitting: Internal Medicine

## 2020-12-12 DIAGNOSIS — E661 Drug-induced obesity: Secondary | ICD-10-CM | POA: Diagnosis not present

## 2020-12-12 DIAGNOSIS — I509 Heart failure, unspecified: Secondary | ICD-10-CM | POA: Diagnosis not present

## 2020-12-12 DIAGNOSIS — R609 Edema, unspecified: Secondary | ICD-10-CM | POA: Diagnosis not present

## 2020-12-12 DIAGNOSIS — D696 Thrombocytopenia, unspecified: Secondary | ICD-10-CM | POA: Insufficient documentation

## 2020-12-12 DIAGNOSIS — M6281 Muscle weakness (generalized): Secondary | ICD-10-CM | POA: Diagnosis not present

## 2020-12-12 NOTE — Telephone Encounter (Signed)
[facility] is requesting verbals  for occupational plan of care for pt. April [name] states he will send over fax, if he could get a signature ASAP.     Callback #- [phone]

## 2020-12-12 NOTE — Progress Notes (Signed)
[place] Telephone:([phone]) [phone]   [phone]  PROGRESS NOTE  Patient Care Team: [name], MD as PCP - General (Internal Medicine)  Hematological/Oncological History 1) 05/08/2020: Presented with leg pain with difficulty walking x 10 days. Doppler US confirmed age indeterminate DVT involving right posterior tibial veins and left posterior tibial veins. CTA was negative for pulmonary embolism. Started on Eliquis.   2) 10/02/2020: Establish care with Dede Query PA-C  CHIEF COMPLAINTS/PURPOSE OF CONSULTATION:  Bilateral lower extremity DVT  HISTORY OF PRESENTING ILLNESS:  April [name] 70 y.o. female returns for follow-up for history of bilateral lower extremity DVT, currently on Eliquis.  Since last visit on 10/02/2020, patient has been hospitalized for chronic hypoxic hypercapnic respiratory failure and CHF exacerbation requiring BiPAP.  Patient is accompanied by her husband for this visit.  On exam today, April [name] reports her energy levels and appetite are fairly stable.  She reports feeling better since recent hospitalization but continues to recover.  She continues to be sedentary and is using a wheelchair for ambulation.  She sits in a recliner for most of the day.  She denies any nausea, vomiting or abdominal pain. Her bowel movements are regular without any diarrhea or constipation.  She has chronic shortness of breath with exertion that has been present for several months.  She denies any signs of bleeding or easy bruising.  She is otherwise tolerating Eliquis without any significant limitations.  Patient denies any fevers, chills, night sweats, chest pain or cough.  She has no other complaints.  Rest of the 10 point ROS is below.  MEDICAL HISTORY:  Past Medical History:  Diagnosis Date   Anemia    Asthma    CAD (coronary artery disease)    Carpal tunnel syndrome    Cellulitis of both lower extremities 04/11/2015   CHF (congestive heart  failure) (HCC)    Chronic kidney disease    [facility]- followed by [facility]   Colon polyp, hyperplastic 3419 & 3790   Complication of anesthesia 1999   svt with renal calculi surgery, no problems since   CTS (carpal tunnel syndrome)    bilateral   Diabetes mellitus    Eczema    GERD (gastroesophageal reflux disease)    History of kidney stones 1999   Hyperlipidemia    Hypertension    Leg ulcer ([place]) 04/24/2015   Right lateral leg No evidence of an infection Monitor closely Keep edema controlled    Leg ulcer ([place])    right lower   Meralgia paresthetica    Dr. [name]   Morbid obesity ([place])    Neuropathy    toes and legs   Osteoarthrosis, unspecified whether generalized or localized, lower leg    knee   PUD (peptic ulcer disease)    Shortness of breath dyspnea    with exertion   Sleep apnea    per progress note 02/25/2018   Type II or unspecified type diabetes mellitus without mention of complication, not stated as uncontrolled    Unspecified hereditary and idiopathic peripheral neuropathy    Urticaria    Vitamin B12 deficiency    Wears glasses    Wound cellulitis    right upper leg, healing well    SURGICAL HISTORY: Past Surgical History:  Procedure Laterality Date   ABDOMINAL HYSTERECTOMY     BREAST BIOPSY Right 07/08/2018   CARDIAC CATHETERIZATION  2002   non obstructive disease   colonoscopy with polypectomy  2007 & 2012  hyperplastic ;Dr Watt Climes   COLONOSCOPY WITH PROPOFOL N/A 06/04/2015   Procedure: COLONOSCOPY WITH PROPOFOL;  Surgeon: Jerene Bears, MD;  Location: [facility];  Service: Gastroenterology;  Laterality: N/A;   DEBRIDEMENT LEG Right 03/02/2018   WOUND VAC APPLIED   DEBRIDEMENT LEG Right 05/27/2018   RIGHT LOWER LEG DEBRIDEMENT,  SKIN GRAFT, VAC PLACEMENT    DILATION AND CURETTAGE OF UTERUS     multiple   HEMORRHOID SURGERY     I & D EXTREMITY Right 03/02/2018   Procedure: RIGHT LEG DEBRIDEMENT AND PLACE VAC;  Surgeon: [name], MD;  Location:  [place];  Service: Orthopedics;  Laterality: Right;   I & D EXTREMITY Right 03/04/2018   Procedure: REPEAT IRRIGATION AND DEBRIDEMENT RIGHT LEG, PLACE WOUND VAC;  Surgeon: [name], MD;  Location: [place];  Service: Orthopedics;  Laterality: Right;   I & D EXTREMITY Right 03/30/2018   Procedure: IRRIGATION AND DEBRIDEMENT RIGHT LEG, APPLY WOUND VAC;  Surgeon: [name], MD;  Location: [place];  Service: Orthopedics;  Laterality: Right;   I & D EXTREMITY Right 05/27/2018   Procedure: RIGHT LOWER LEG DEBRIDEMENT,  SKIN GRAFT, VAC PLACEMENT;  Surgeon: [name], MD;  Location: [place];  Service: Orthopedics;  Laterality: Right;  RIGHT LOWER LEG DEBRIDEMENT,  SKIN GRAFT, VAC PLACEMENT   renal calculi  12/1997   SVT with induction of anesthesia   RIGHT HEART CATH N/A 11/19/2020   Procedure: RIGHT HEART CATH;  Surgeon: Jolaine Artist, MD;  Location: [place] [facility];  Service: Cardiovascular;  Laterality: N/A;   right knee arthroscopy     SKIN SPLIT GRAFT Right 03/04/2018   Procedure: POSSIBLE SPLIT THICKNESS SKIN GRAFT;  Surgeon: [name], MD;  Location: [place];  Service: Orthopedics;  Laterality: Right;   SKIN SPLIT GRAFT Right 04/01/2018   Procedure: REPEAT IRRIGATION AND DEBRIDEMENT RIGHT LEG, APPLY SPLIT THICKNESS SKIN GRAFT;  Surgeon: [name], MD;  Location: [place];  Service: Orthopedics;  Laterality: Right;   TONSILLECTOMY AND ADENOIDECTOMY      SOCIAL HISTORY: Social History   Socioeconomic History   Marital status: Married    Spouse name: Dwight   Number of children: 1   Years of education: BS   Highest education level: Not on file  Occupational History   Occupation: Disabled    Employer: RETIRED  Tobacco Use   Smoking status: Never   Smokeless tobacco: Never  Vaping Use   Vaping Use: Never used  Substance and Sexual Activity   Alcohol use: No    Alcohol/week: 0.0 standard drinks   Drug use: No   Sexual activity: Not on file  Other Topics Concern    Not on file  Social History Narrative   Patient lives at home with her husband [name] Patient is retired and has a Conservation officer, nature.    Caffeine - some times.   Right handed.   Social Determinants of Health   Financial Resource Strain: Not on file  Food Insecurity: Not on file  Transportation Needs: Not on file  Physical Activity: Not on file  Stress: Not on file  Social Connections: Not on file  Intimate Partner Violence: Not on file    FAMILY HISTORY: Family History  Problem Relation Age of Onset   Colon cancer Mother    Prostate cancer Father    Colon cancer Father    Diabetes Maternal Aunt    Breast cancer Maternal Aunt  Diabetes Maternal Uncle    Diabetes Paternal Aunt    Stroke Paternal Aunt        > 94   Heart disease Paternal Aunt    Diabetes Paternal Uncle    Breast cancer Maternal Aunt         X 2   Breast cancer Cousin     ALLERGIES:  is allergic to penicillins, shellfish allergy, sulfonamide derivatives, hydrochlorothiazide w-triamterene, lotensin [benazepril hcl], dipyridamole, estrogens, hydrochlorothiazide, latex, metronidazole, other, spironolactone, sulfa antibiotics, torsemide, valsartan, and mustard [allyl isothiocyanate].  MEDICATIONS:  Current Outpatient Medications  Medication Sig Dispense Refill   acetaminophen (TYLENOL) 325 MG tablet Take 2 tablets (650 mg total) by mouth every 6 (six) hours as needed for moderate pain (or Fever >/= 101). 20 tablet 0   Alcohol Swabs (B-D SINGLE USE SWABS REGULAR) PADS UAD   E11.9 100 each 3   allopurinol (ZYLOPRIM) 100 MG tablet Take 2 tablets (200 mg total) by mouth daily. 180 tablet 1   apixaban (ELIQUIS) 5 MG TABS tablet Take 5 mg by mouth 2 (two) times daily.     atorvastatin (LIPITOR) 40 MG tablet Take 1 tablet (40 mg total) by mouth at bedtime. 90 tablet 2   Blood Glucose Calibration (TRUE METRIX LEVEL 1) Low SOLN UAD  E11.9 1 each 3   Budesonide (PULMICORT FLEXHALER) 90 MCG/ACT inhaler Inhale 1  puff into the lungs 2 (two) times daily.     budesonide-formoterol (SYMBICORT) 160-4.5 MCG/ACT inhaler one - two inhalations every 12 hours; gargle and spit after use 1 Inhaler 4   calcium carbonate (TUMS - DOSED IN MG ELEMENTAL CALCIUM) 500 MG chewable tablet Chew 2 tablets by mouth daily as needed for indigestion or heartburn.     cholecalciferol (VITAMIN D3) 25 MCG (1000 UNIT) tablet Take 1 tablet (1,000 Units total) by mouth daily. 90 tablet 1   COLCHICINE PO Take 1.2 mg by mouth as needed. 2 tablets     cyanocobalamin (,VITAMIN B-12,) 1000 MCG/ML injection INJECT 1ML INTO MUSCLE EVERY 30 DAYS 10 mL 5   diclofenac sodium (VOLTAREN) 1 % GEL Apply 2 g topically 4 (four) times daily. 100 g 11   famotidine (PEPCID) 20 MG tablet Take 1 tablet (20 mg total) by mouth 2 (two) times daily. 180 tablet 3   FEROSUL 325 (65 Fe) MG tablet TAKE 1 TABLET BY MOUTH EVERY MONDAY, WEDNESDAY, AND FRIDAY 60 tablet 0   folic acid (FOLVITE) 158 MCG tablet Take 1 tablet (400 mcg total) by mouth at bedtime. Take 400 mcg by mouth at bedtime. (Patient taking differently: Take 400 mcg by mouth at bedtime. Take 400 mcg by mouth at bedtime.) 90 tablet 1   gabapentin (NEURONTIN) 100 MG capsule TAKE 1 CAPSULE(100 MG) BY MOUTH THREE TIMES DAILY 90 capsule 1   hydrOXYzine (ATARAX/VISTARIL) 25 MG tablet Take 1 tablet (25 mg total) by mouth 3 (three) times daily as needed for itching. 30 tablet 0   montelukast (SINGULAIR) 10 MG tablet Take 1 tablet (10 mg total) by mouth at bedtime. 30 tablet 5   nebivolol (BYSTOLIC) 5 MG tablet Take 1 tablet (5 mg total) by mouth daily. 30 tablet 3   Nutritional Supplements (,FEEDING SUPPLEMENT, PROSOURCE PLUS) liquid Take 30 mLs by mouth 2 (two) times daily between meals.     Oxcarbazepine (TRILEPTAL) 300 MG tablet Take 1 tablet (300 mg total) by mouth 2 (two) times daily. 180 tablet 1   polyethylene glycol (MIRALAX / GLYCOLAX) 17 g packet Take 17  g by mouth as needed.     potassium chloride SA  (KLOR-CON) 20 MEQ tablet Take 1 tablet (20 mEq total) by mouth daily. 10 tablet 0   senna-docusate (SENOKOT-S) 8.6-50 MG tablet Take 1 tablet by mouth at bedtime as needed for mild constipation. 30 tablet 0   spironolactone (ALDACTONE) 25 MG tablet TAKE 1 TABLET(25 MG) BY MOUTH DAILY 90 tablet 0   Torsemide 40 MG TABS Take 80 mg by mouth every morning AND 60 mg every evening. 135 tablet 2   VENTOLIN HFA 108 (90 Base) MCG/ACT inhaler Inhale 2 puffs into the lungs every 4 (four) hours as needed for wheezing.     No current facility-administered medications for this visit.    REVIEW OF SYSTEMS:   Constitutional: ( - ) fevers, ( - )  chills , ( - ) night sweats Eyes: ( - ) blurriness of vision, ( - ) double vision, ( - ) watery eyes Ears, nose, mouth, throat, and face: ( - ) mucositis, ( - ) sore throat Respiratory: ( - ) cough, (+) dyspnea, ( - ) wheezes Cardiovascular: ( - ) palpitation, ( - ) chest discomfort, ( +) lower extremity swelling Gastrointestinal:  ( - ) nausea, ( - ) heartburn, ( - ) change in bowel habits Skin: ( - ) abnormal skin rashes Lymphatics: ( - ) new lymphadenopathy, ( - ) easy bruising Neurological: ( + ) numbness, ( - ) tingling, ( - ) new weaknesses Behavioral/Psych: ( - ) mood change, ( - ) new changes  All other systems were reviewed with the patient and are negative.  PHYSICAL EXAMINATION: ECOG PERFORMANCE STATUS: 2 - Symptomatic, <50% confined to bed  Vitals:   12/10/20 1035  BP: 129/66  Pulse: 63  Resp: 18  Temp: 97.7 F (36.5 C)  SpO2: 98%   Filed Weights    GENERAL: well appearing female in NAD, obese and in a wheelchair for the visit.  SKIN: skin color, texture, turgor are normal, no rashes or significant lesions EYES: conjunctiva are pink and non-injected, sclera clear LUNGS: clear to auscultation and percussion with normal breathing effort HEART: regular rate & rhythm and no murmurs. Bilateral lower extremity edema, patient is wearing  compression stockings. ABDOMEN: soft, non-tender, non-distended, normal bowel sounds Musculoskeletal: no cyanosis of digits and no clubbing  PSYCH: alert & oriented x 3, fluent speech NEURO: no focal motor/sensory deficits  LABORATORY DATA:  I have reviewed the data as listed CBC Latest Ref Rng & Units 12/10/2020 11/29/2020 11/20/2020  WBC 4.0 - 10.5 K/uL 5.7 4.5 5.5  Hemoglobin 12.0 - 15.0 g/dL 11.4(L) 12.0 12.4  Hematocrit 36.0 - 46.0 % 37.3 38.5 41.5  Platelets 150 - 400 K/uL 111(L) 153.0 156    CMP Latest Ref Rng & Units 12/10/2020 12/04/2020 11/29/2020  Glucose 70 - 99 mg/dL 127(H) 152(H) 122(H)  BUN 8 - 23 mg/dL 26(H) 34(H) 43(H)  Creatinine 0.44 - 1.00 mg/dL 1.28(H) 1.62(H) 1.38(H)  Sodium 135 - 145 mmol/L 138 138 140  Potassium 3.5 - 5.1 mmol/L 4.0 3.7 3.8  Chloride 98 - 111 mmol/L 102 99 100  CO2 22 - 32 mmol/L 28 31 33(H)  Calcium 8.9 - 10.3 mg/dL 8.8(L) 9.3 9.8  Total Protein 6.5 - 8.1 g/dL 6.6 - 7.4  Total Bilirubin 0.3 - 1.2 mg/dL 0.6 - 0.5  Alkaline Phos 38 - 126 U/L 102 - 99  AST 15 - 41 U/L 17 - 23  ALT 0 - 44 U/L 19 -  21   RADIOGRAPHIC STUDIES: I have personally reviewed the radiological images as listed and agreed with the findings in the report.  05/07/2020: CTA chest: Slightly suboptimal opacification of the main pulmonary artery however no central or proximal segmental pulmonary embolism. Mild cardiomegaly in findings suggestive of pulmonary arterial Hypertension. Minimal ground-glass opacity at both lung bases which may be due to Atelectasis. Small hiatal hernia. Aortic Atherosclerosis.  05/08/2020: Doppler US Lower Extremity: Findings consistent with age-indeterminate deep vein thrombosis involving the right posterior tibial vein and the left posterior tibial veins.  ASSESSMENT & PLAN April [name] is a 70 y.o. female returns for a follow up for bilateral lower extremity edema, currently on Eliquis.   #Bilateral lower extremity DVT involving the  posterior tibial veins: -- Currently on Eliquis 5 mg twice daily with good tolerance. If patient is unable to afford Eliquis, we will consider switching her to coumadin.  -- Possible etiologies include sedentary lifestyle and obesity.  Given no definitive provoking factor and unmodifiable risk factors, recommend indefinite anticoagulation. -- No evidence of antiphospholipid syndrome based on laboratory evaluation from 10/02/2020. --Discussed the option of proceeding with maintenance dose versus continue on the full dose of Eliquis.  I explained the use of DOAC therapy in patients with this level of obesity has never been adequately studied.  We decided to continue on full dose of Eliquis at this time and reassess in the future. --RTC in 6 months with labs prior.    #Thrombocytopenia: --Today's platelet count is 111K. Prior review of records shows levels oscillate. Prior platelet count on 11/29/2020 was within normal limits.  --No evidence of nutritional deficiencies based on labs form 11/19/2020.  --HIV serology from 11/15/2020 was nonreative --Plan to repeat CBC in a week to see if deficiency is persistent. If there is continued thrombocytopenia, we will recommend additional workup including hepatitis B and C serologies, abdominal US to evaluate for liver disease/splenomegaly, and potentially bone marrow biopsy to evaluate for bone marrow disorders.   #Mild normocytic anemia: --Likely secondary to chronic disease including CKD. Anemia is very mild with Hgb of 11.4 today. --No evidence of vitamin B12, iron or folate deficiencies based on labs from 11/19/2020. There is adequate reticulocytes.  --Monitor for now. Consider additional workup if anemia worsens in the future including SPEP with IFE, sFLC, copper levels.   No orders of the defined types were placed in this encounter.   All questions were answered. The patient knows to call the clinic with any problems, questions or concerns.  I have  spent a total of 25 minutes minutes of face-to-face and non-face-to-face time, preparing to see the patient, obtaining and/or reviewing separately obtained history, performing a medically appropriate examination, counseling and educating the patient, ordering tests, documenting clinical information in the electronic health record,and care coordination.    Dede Query, PA-C Department of Hematology/Oncology [place] at [facility] Phone: [phone]

## 2020-12-12 NOTE — Telephone Encounter (Signed)
Ok - keep an eye out for it

## 2020-12-13 ENCOUNTER — Ambulatory Visit: Payer: Medicare PPO

## 2020-12-13 ENCOUNTER — Other Ambulatory Visit (HOSPITAL_COMMUNITY): Payer: Self-pay | Admitting: Family Medicine

## 2020-12-13 ENCOUNTER — Other Ambulatory Visit: Payer: Self-pay

## 2020-12-13 ENCOUNTER — Telehealth: Payer: Self-pay | Admitting: Internal Medicine

## 2020-12-13 ENCOUNTER — Other Ambulatory Visit: Payer: Self-pay | Admitting: Internal Medicine

## 2020-12-13 DIAGNOSIS — N1832 Chronic kidney disease, stage 3b: Secondary | ICD-10-CM

## 2020-12-13 MED ORDER — MONTELUKAST SODIUM 10 MG PO TABS: ORAL_TABLET | ORAL | 5 refills | Status: DC

## 2020-12-13 MED ORDER — APIXABAN 5 MG PO TABS
5.0000 mg | ORAL_TABLET | Freq: Two times a day (BID) | ORAL | 1 refills | Status: DC
Start: 2020-12-13 — End: 2021-05-16

## 2020-12-13 MED ORDER — ATORVASTATIN CALCIUM 40 MG PO TABS: 40.0000 mg | ORAL_TABLET | Freq: Every day | ORAL | 2 refills | Status: DC

## 2020-12-13 NOTE — Telephone Encounter (Signed)
Attempted to reach Brain back however this is not the correct phone to reach him back at.  Still waiting for fax

## 2020-12-13 NOTE — Telephone Encounter (Signed)
Pt. Connected to [facility] 11.11.2022.    Caller states she has an appointment for ran injection, she is wheel chair bound, and her husband is sick and cant take her.   Caller request a call back to reschedule her appointment. Office hours provided.

## 2020-12-13 NOTE — Telephone Encounter (Signed)
Sent in today 

## 2020-12-13 NOTE — Telephone Encounter (Signed)
1.Medication Requested: apixaban (ELIQUIS) 5 MG TABS tablet atorvastatin (LIPITOR) 40 MG tablet montelukast (SINGULAIR) 10 MG tablet 2. Pharmacy (Name, Street, [place]): [facility] [phone] - [address] AT [place] Phone:  [phone]  Fax:  [phone]     3. On Med List: Y  4. Last Visit with PCP: 10.28.2022  5. Next visit date with PCP: 12.16.2022   Agent: Please be advised that RX refills may take up to 3 business days. We ask that you follow-up with your pharmacy.

## 2020-12-16 ENCOUNTER — Telehealth: Payer: Self-pay | Admitting: Pulmonary Disease

## 2020-12-16 ENCOUNTER — Other Ambulatory Visit (HOSPITAL_COMMUNITY): Payer: Medicare PPO

## 2020-12-16 DIAGNOSIS — I2510 Atherosclerotic heart disease of native coronary artery without angina pectoris: Secondary | ICD-10-CM | POA: Diagnosis not present

## 2020-12-16 DIAGNOSIS — E1122 Type 2 diabetes mellitus with diabetic chronic kidney disease: Secondary | ICD-10-CM | POA: Diagnosis not present

## 2020-12-16 DIAGNOSIS — N1832 Chronic kidney disease, stage 3b: Secondary | ICD-10-CM | POA: Diagnosis not present

## 2020-12-16 DIAGNOSIS — J9622 Acute and chronic respiratory failure with hypercapnia: Secondary | ICD-10-CM | POA: Diagnosis not present

## 2020-12-16 DIAGNOSIS — G4734 Idiopathic sleep related nonobstructive alveolar hypoventilation: Secondary | ICD-10-CM

## 2020-12-16 DIAGNOSIS — J9621 Acute and chronic respiratory failure with hypoxia: Secondary | ICD-10-CM | POA: Diagnosis not present

## 2020-12-16 DIAGNOSIS — I5033 Acute on chronic diastolic (congestive) heart failure: Secondary | ICD-10-CM | POA: Diagnosis not present

## 2020-12-16 DIAGNOSIS — J45909 Unspecified asthma, uncomplicated: Secondary | ICD-10-CM | POA: Diagnosis not present

## 2020-12-16 DIAGNOSIS — E662 Morbid (severe) obesity with alveolar hypoventilation: Secondary | ICD-10-CM

## 2020-12-16 DIAGNOSIS — I130 Hypertensive heart and chronic kidney disease with heart failure and stage 1 through stage 4 chronic kidney disease, or unspecified chronic kidney disease: Secondary | ICD-10-CM | POA: Diagnosis not present

## 2020-12-16 DIAGNOSIS — D631 Anemia in chronic kidney disease: Secondary | ICD-10-CM | POA: Diagnosis not present

## 2020-12-16 NOTE — Telephone Encounter (Signed)
Call patient  Sleep study result  Date of study: 12/06/2020  Impression: Negative for significant sleep disordered breathing Nocturnal desaturations requiring 1 L of oxygen supplementation  Recommendation: Oxygen supplementation at 1 L Schedule for repeat overnight oximetry on 1 L oxygen supplementation to ascertain adequate supplementation Encourage adequate number of hours of sleep at night 6 to 8 hours Encourage weight loss efforts  Follow-up as previously scheduled in about 4 weeks

## 2020-12-16 NOTE — Procedures (Signed)
POLYSOMNOGRAPHY  Last, [name], [name] MRN: [id number] Gender: Female Age (years): 54 Weight (lbs): 351 DOB: 1950-06-26 BMI: 60 Primary Care: No PCP Epworth Score: 8 Referring: Laurin Coder MD Technician: Zadie Rhine Interpreting: Laurin Coder MD Study Type: NPSG Ordered Study Type: Split Night CPAP Study date: 12/06/2020 Location: [place] CLINICAL INFORMATION April [name] is a 70 year old Female and was referred to the sleep center for evaluation of G47.30 Sleep Apnea, Unspecified (780.57). Indications include Obesity, OSA, Snoring.  MEDICATIONS Patient self administered medications include: GABAPENTIN, LIPITOR, eliquis, PEPCID, Eszopiclone. Medications administered during study include Sleep medicine administered - Eszopiclone 1 mg at 10:30:10 PM  SLEEP STUDY TECHNIQUE A multi-channel overnight Polysomnography study was performed. The channels recorded and monitored were central and occipital EEG, electrooculogram (EOG), submentalis EMG (chin), nasal and oral airflow, thoracic and abdominal wall motion, anterior tibialis EMG, snore microphone, electrocardiogram, and a pulse oximetry. TECHNICIAN COMMENTS Comments added by Technician: no restroom visted Comments added by Scorer: N/A SLEEP ARCHITECTURE The study was initiated at 10:59:28 PM and terminated at 5:01:48 AM. The total recorded time was 362.3 minutes. EEG confirmed total sleep time was 344.9 minutes yielding a sleep efficiency of 95.2%%. Sleep onset after lights out was 9.0 minutes with a REM latency of 60.5 minutes. The patient spent 1.2%% of the night in stage N1 sleep, 66.4%% in stage N2 sleep, 19.6%% in stage N3 and 12.9% in REM. Wake after sleep onset (WASO) was 8.5 minutes. The Arousal Index was 7.1/hour. RESPIRATORY PARAMETERS There were a total of 12 respiratory disturbances out of which 4 were apneas ( 4 obstructive, 0 mixed, 0 central) and 8 hypopneas. The apnea/hypopnea index (AHI) was 2.1  events/hour. The central sleep apnea index was 0 events/hour. The REM AHI was 6.7 events/hour and NREM AHI was 1.6 events/hour. The supine AHI was 2.1 events/hour and the non supine AHI was 0 supine during 100.00% of sleep. Respiratory disturbances were associated with oxygen desaturation down to a nadir of 84.0% during sleep. The mean oxygen saturation during the study was 93.8%. The cumulative time under 88% oxygen saturation was 5.5 minutes.  LEG MOVEMENT DATA The total leg movements were 0 with a resulting leg movement index of 0.0/hr .Associated arousal with leg movement index was 0.0/hr.  CARDIAC DATA The underlying cardiac rhythm was most consistent with sinus rhythm. Mean heart rate during sleep was 62.2 bpm. Additional rhythm abnormalities include None.  IMPRESSIONS - No Significant Obstructive Sleep apnea(OSA) - EKG showed no cardiac abnormalities. - Mild Oxygen Desaturation,required 1L oxygen supplementation - The patient snored with soft snoring volume. - No significant periodic leg movements(PLMs) during sleep. However, no significant associated arousals.  DIAGNOSIS - Nocturnal Hypoxemia (G47.36)  RECOMMENDATIONS - Avoid alcohol, sedatives and other CNS depressants that may worsen sleep apnea and disrupt normal sleep architecture. - Sleep hygiene should be reviewed to assess factors that may improve sleep quality. - Oxygen supplementation at 1L with repeat oximetry to ascertain adequate supplementation - Weight management and regular exercise should be initiated or continued.  [Electronically signed] 12/16/2020 07:15 AM  Sherrilyn Rist MD NPI: [id number]

## 2020-12-17 ENCOUNTER — Ambulatory Visit (HOSPITAL_COMMUNITY)
Admission: RE | Admit: 2020-12-17 | Discharge: 2020-12-17 | Disposition: A | Discharge status: Home / Self Care | Payer: Medicare PPO | Source: Ambulatory Visit | Attending: Family Medicine | Admitting: Family Medicine

## 2020-12-17 ENCOUNTER — Other Ambulatory Visit (HOSPITAL_COMMUNITY): Payer: Self-pay | Admitting: Cardiology

## 2020-12-17 DIAGNOSIS — I5032 Chronic diastolic (congestive) heart failure: Secondary | ICD-10-CM | POA: Diagnosis not present

## 2020-12-17 LAB — CBC WITH DIFFERENTIAL/PLATELET
Abs Immature Granulocytes: 0.02 10*3/uL (ref 0.00–0.07)
Basophils Absolute: 0.1 10*3/uL (ref 0.0–0.1)
Basophils Relative: 1 %
Eosinophils Absolute: 0.1 10*3/uL (ref 0.0–0.5)
Eosinophils Relative: 2 %
HCT: 38.6 % (ref 36.0–46.0)
Hemoglobin: 11.8 g/dL — ABNORMAL LOW (ref 12.0–15.0)
Immature Granulocytes: 0 %
Lymphocytes Relative: 21 %
Lymphs Abs: 1.1 10*3/uL (ref 0.7–4.0)
MCH: 30.7 pg (ref 26.0–34.0)
MCHC: 30.6 g/dL (ref 30.0–36.0)
MCV: 100.5 fL — ABNORMAL HIGH (ref 80.0–100.0)
Monocytes Absolute: 0.5 10*3/uL (ref 0.1–1.0)
Monocytes Relative: 9 %
Neutro Abs: 3.5 10*3/uL (ref 1.7–7.7)
Neutrophils Relative %: 67 %
Platelets: 110 10*3/uL — ABNORMAL LOW (ref 150–400)
RBC: 3.84 MIL/uL — ABNORMAL LOW (ref 3.87–5.11)
RDW: 15.6 % — ABNORMAL HIGH (ref 11.5–15.5)
WBC: 5.3 10*3/uL (ref 4.0–10.5)
nRBC: 0 % (ref 0.0–0.2)

## 2020-12-17 LAB — BASIC METABOLIC PANEL
Anion gap: 9 (ref 5–15)
BUN: 16 mg/dL (ref 8–23)
CO2: 27 mmol/L (ref 22–32)
Calcium: 8.8 mg/dL — ABNORMAL LOW (ref 8.9–10.3)
Chloride: 102 mmol/L (ref 98–111)
Creatinine, Ser: 1.19 mg/dL — ABNORMAL HIGH (ref 0.44–1.00)
GFR, Estimated: 49 mL/min — ABNORMAL LOW (ref >60)
Glucose, Bld: 123 mg/dL — ABNORMAL HIGH (ref 70–99)
Potassium: 4 mmol/L (ref 3.5–5.1)
Sodium: 138 mmol/L (ref 135–145)

## 2020-12-17 NOTE — Telephone Encounter (Signed)
BiPAP should not be discontinued  She is on BiPAP for chronic hypercapnic respiratory failure-her carbon dioxide that was recently obtained in the hospital was in the 80s, normal is about 40-the BiPAP helps keep this controlled -The reason for the BiPAP use to help blow off CO2 not to treat sleep apnea  The reason for the study was to make sure she does not have associated sleep apnea  Discontinuing BiPAP will lead that the shallow breathing that occurs during sleep, will contribute to carbon dioxide retention-risk of hospitalization, risk of increased mortality.  I will gladly discuss further during follow-up visit

## 2020-12-17 NOTE — Progress Notes (Signed)
cbc

## 2020-12-17 NOTE — Telephone Encounter (Signed)
Called and spoke to pt. Informed her of the results and recs per Dr. Ander Slade. Order placed for ONO on 1lpm, pt already has O2 at the house. DME is [facility] in [place]. Pt also states she has a CPAP from her most recent hospitalization. Pt requesting order for d/c. Pt also has OV with AO on 11/22, however, Dr. Ander Slade requests f/u In 4 weeks.    Dr. Ander Slade, please advise if ok to order d/c CPAP and if ok to keep her OV with you on 11/22 or push back 4 weeks. Thanks.

## 2020-12-18 DIAGNOSIS — J45909 Unspecified asthma, uncomplicated: Secondary | ICD-10-CM | POA: Diagnosis not present

## 2020-12-18 DIAGNOSIS — I5033 Acute on chronic diastolic (congestive) heart failure: Secondary | ICD-10-CM | POA: Diagnosis not present

## 2020-12-18 DIAGNOSIS — J9622 Acute and chronic respiratory failure with hypercapnia: Secondary | ICD-10-CM | POA: Diagnosis not present

## 2020-12-18 DIAGNOSIS — J9621 Acute and chronic respiratory failure with hypoxia: Secondary | ICD-10-CM | POA: Diagnosis not present

## 2020-12-18 DIAGNOSIS — I2510 Atherosclerotic heart disease of native coronary artery without angina pectoris: Secondary | ICD-10-CM | POA: Diagnosis not present

## 2020-12-18 DIAGNOSIS — D631 Anemia in chronic kidney disease: Secondary | ICD-10-CM | POA: Diagnosis not present

## 2020-12-18 DIAGNOSIS — E1122 Type 2 diabetes mellitus with diabetic chronic kidney disease: Secondary | ICD-10-CM | POA: Diagnosis not present

## 2020-12-18 DIAGNOSIS — I130 Hypertensive heart and chronic kidney disease with heart failure and stage 1 through stage 4 chronic kidney disease, or unspecified chronic kidney disease: Secondary | ICD-10-CM | POA: Diagnosis not present

## 2020-12-18 DIAGNOSIS — N1832 Chronic kidney disease, stage 3b: Secondary | ICD-10-CM | POA: Diagnosis not present

## 2020-12-18 NOTE — Telephone Encounter (Signed)
Called patient but she did not answer. Left message for her to call back.  

## 2020-12-19 ENCOUNTER — Other Ambulatory Visit: Payer: Self-pay | Admitting: Physician Assistant

## 2020-12-19 NOTE — Telephone Encounter (Signed)
Patient is returning phone call. Patient phone number is [phone].

## 2020-12-19 NOTE — Telephone Encounter (Signed)
Called and spoke with patient. She verbalized understanding about results and is aware to not stop using her bipap machine.   Now that she understands the sleep study results, she wanted to see if she still needed the OV that is scheduled for next week. She thought maybe it was a hospital follow up since she was in the hospital last month as well.   AO, can you please advise? Thanks.

## 2020-12-20 ENCOUNTER — Ambulatory Visit: Payer: Medicare PPO | Admitting: Internal Medicine

## 2020-12-20 ENCOUNTER — Encounter: Payer: Self-pay | Admitting: Internal Medicine

## 2020-12-20 ENCOUNTER — Ambulatory Visit (INDEPENDENT_AMBULATORY_CARE_PROVIDER_SITE_OTHER): Payer: Medicare PPO

## 2020-12-20 ENCOUNTER — Other Ambulatory Visit: Payer: Self-pay

## 2020-12-20 ENCOUNTER — Telehealth: Payer: Self-pay | Admitting: *Deleted

## 2020-12-20 ENCOUNTER — Other Ambulatory Visit: Payer: Self-pay | Admitting: Internal Medicine

## 2020-12-20 ENCOUNTER — Telehealth: Payer: Self-pay | Admitting: Internal Medicine

## 2020-12-20 VITALS — BP 129/70 | HR 70 | Ht 64.0 in | Wt 351.0 lb

## 2020-12-20 DIAGNOSIS — K219 Gastro-esophageal reflux disease without esophagitis: Secondary | ICD-10-CM | POA: Diagnosis not present

## 2020-12-20 DIAGNOSIS — Z800 Family history of malignant neoplasm of digestive organs: Secondary | ICD-10-CM

## 2020-12-20 DIAGNOSIS — Z7901 Long term (current) use of anticoagulants: Secondary | ICD-10-CM | POA: Diagnosis not present

## 2020-12-20 DIAGNOSIS — E538 Deficiency of other specified B group vitamins: Secondary | ICD-10-CM | POA: Diagnosis not present

## 2020-12-20 MED ORDER — PLENVU 140 G PO SOLR: 1.0000 | ORAL | 0 refills | Status: DC

## 2020-12-20 MED ORDER — CYANOCOBALAMIN 1000 MCG/ML IJ SOLN
1000.0000 ug | Freq: Once | INTRAMUSCULAR | Status: AC
  Administered 2020-12-20: 1000 ug via INTRAMUSCULAR

## 2020-12-20 MED ORDER — GABAPENTIN 100 MG PO CAPS: ORAL_CAPSULE | ORAL | 1 refills | Status: DC

## 2020-12-20 NOTE — Patient Instructions (Signed)
You have been scheduled for an endoscopy and colonoscopy. Please follow the written instructions given to you at your visit today. Please pick up your prep supplies at the pharmacy within the next 1-3 days. If you use inhalers (even only as needed), please bring them with you on the day of your procedure.  If you are age 70 or older, your body mass index should be between 23-30. Your Body mass index is 60.25 kg/m. If this is out of the aforementioned range listed, please consider follow up with your Primary Care Provider.  If you are age 29 or younger, your body mass index should be between 19-25. Your Body mass index is 60.25 kg/m. If this is out of the aformentioned range listed, please consider follow up with your Primary Care Provider.   ________________________________________________________  The [facility] providers would like to encourage you to use [facility] to communicate with providers for non-urgent requests or questions.  Due to long hold times on the telephone, sending your provider a message by [facility] may be a faster and more efficient way to get a response.  Please allow 48 business hours for a response.  Please remember that this is for non-urgent requests.  _______________________________________________________ Due to recent changes in healthcare laws, you may see the results of your imaging and laboratory studies on MyChart before your provider has had a chance to review them.  We understand that in some cases there may be results that are confusing or concerning to you. Not all laboratory results come back in the same time frame and the provider may be waiting for multiple results in order to interpret others.  Please give [place] 48 hours in order for your provider to thoroughly review all the results before contacting the office for clarification of your results.

## 2020-12-20 NOTE — Telephone Encounter (Signed)
Sent in today for patient. 

## 2020-12-20 NOTE — Telephone Encounter (Signed)
   April [name] [birth year] [id number]  Dear Dr Quay Burow:  We have scheduled the above named patient for a(n) endoscopy/colonoscopy procedure. Our records show that (s)he is on anticoagulation therapy.  Please advise as to whether the patient may come off their therapy of Eliquis 2 days prior to their procedure which is scheduled for 03/20/21  Please route your response to Dixon Boos, [place].  Sincerely,    [facility]

## 2020-12-20 NOTE — Progress Notes (Signed)
Pt given B12 vacc w/o any complications. 

## 2020-12-20 NOTE — Telephone Encounter (Signed)
Yes, she can come off the eliquis for two days for her colonoscopy.    [name], MD

## 2020-12-20 NOTE — Telephone Encounter (Signed)
I have spoken to patient to advise that per Dr Quay Burow, she may hold Eliquis 2 days prior to her upcoming procedure. She verbalizes understanding of this.

## 2020-12-20 NOTE — Telephone Encounter (Signed)
1.Medication Requested: gabapentin (NEURONTIN) 100 MG capsule   2. Pharmacy (Name, Street, [place]):  [facility] [phone] - [place], [address]  Phone:  [phone] Fax:  [phone]   3. On Med List: yes   4. Last Visit with PCP:10.28.22  5. Next visit date with PCP:12.16.22   Agent: Please be advised that RX refills may take up to 3 business days. We ask that you follow-up with your pharmacy.

## 2020-12-20 NOTE — Progress Notes (Signed)
Patient ID: April [name], female   DOB: [birth year], 70 y.o.   MRN: [id number] HPI: [name] is a 70 year old female with a past medical history of GERD, strong family history of colorectal cancer (mother and father), obesity, history of DVT on Eliquis, mild to moderate pulmonary hypertension, sleep apnea, diabetes, CKD, hypertension, hyperlipidemia who is seen to discuss screening colonoscopy and GERD.  She is here today with her husband.  I performed a colonoscopy for her in May 2017 based on her family history of colon cancer in both her mother and father.  This was performed in the outpatient hospital setting.  The colon was normal on that day.  She had small hemorrhoids.  She reports that she has been doing fairly well.  She has been evaluated recently by cardiology and had a right heart catheterization after an echocardiogram.  She is working with pulmonary/sleep medicine to determine sleep apnea treatments.  From a GI perspective she has occasional heartburn and occasional nausea.  She has been using famotidine 20 mg twice daily.  She is not having dysphagia or odynophagia.  She has regular bowel movements without blood or melena.  She had previously used senna but this has not been needed as bowel movements recently have been regular.  She does take iron 3 days/week.  She denies abdominal pain.  She is interested in repeat colorectal cancer screening based on her strong family history.  Past Medical History:  Diagnosis Date   Anemia    Asthma    CAD (coronary artery disease)    Carpal tunnel syndrome    Cellulitis of both lower extremities 04/11/2015   CHF (congestive heart failure) (HCC)    Chronic kidney disease    CKI- followed by [facility]   Colon polyp, hyperplastic 9030 & 0923   Complication of anesthesia 1999   svt with renal calculi surgery, no problems since   CTS (carpal tunnel syndrome)    bilateral   Diabetes mellitus    Eczema    GERD (gastroesophageal  reflux disease)    History of kidney stones 1999   Hyperlipidemia    Hypertension    Leg ulcer ([place]) 04/24/2015   Right lateral leg No evidence of an infection Monitor closely Keep edema controlled    Leg ulcer ([place])    right lower   Meralgia paresthetica    Dr. [name]   Morbid obesity ([place])    Neuropathy    toes and legs   Osteoarthrosis, unspecified whether generalized or localized, lower leg    knee   PUD (peptic ulcer disease)    Shortness of breath dyspnea    with exertion   Sleep apnea    per progress note 02/25/2018   Type II or unspecified type diabetes mellitus without mention of complication, not stated as uncontrolled    Unspecified hereditary and idiopathic peripheral neuropathy    Urticaria    Vitamin B12 deficiency    Wears glasses    Wound cellulitis    right upper leg, healing well    Past Surgical History:  Procedure Laterality Date   ABDOMINAL HYSTERECTOMY     BREAST BIOPSY Right 07/08/2018   CARDIAC CATHETERIZATION  2002   non obstructive disease   colonoscopy with polypectomy  2007 & 2012    hyperplastic ;Dr Watt Climes   COLONOSCOPY WITH PROPOFOL N/A 06/04/2015   Procedure: COLONOSCOPY WITH PROPOFOL;  Surgeon: Jerene Bears, MD;  Location: [facility];  Service: Gastroenterology;  Laterality: N/A;   DEBRIDEMENT  LEG Right 03/02/2018   WOUND VAC APPLIED   DEBRIDEMENT LEG Right 05/27/2018   RIGHT LOWER LEG DEBRIDEMENT,  SKIN GRAFT, VAC PLACEMENT    DILATION AND CURETTAGE OF UTERUS     multiple   HEMORRHOID SURGERY     HEMORROIDECTOMY     I & D EXTREMITY Right 03/02/2018   Procedure: RIGHT LEG DEBRIDEMENT AND PLACE VAC;  Surgeon: [name], MD;  Location: [facility];  Service: Orthopedics;  Laterality: Right;   I & D EXTREMITY Right 03/04/2018   Procedure: REPEAT IRRIGATION AND DEBRIDEMENT RIGHT LEG, PLACE WOUND VAC;  Surgeon: [name], MD;  Location: [facility];  Service: Orthopedics;  Laterality: Right;   I & D EXTREMITY Right 03/30/2018   Procedure:  IRRIGATION AND DEBRIDEMENT RIGHT LEG, APPLY WOUND VAC;  Surgeon: [name], MD;  Location: [facility];  Service: Orthopedics;  Laterality: Right;   I & D EXTREMITY Right 05/27/2018   Procedure: RIGHT LOWER LEG DEBRIDEMENT,  SKIN GRAFT, VAC PLACEMENT;  Surgeon: [name], MD;  Location: [facility];  Service: Orthopedics;  Laterality: Right;  RIGHT LOWER LEG DEBRIDEMENT,  SKIN GRAFT, VAC PLACEMENT   renal calculi  12/1997   SVT with induction of anesthesia   RIGHT HEART CATH N/A 11/19/2020   Procedure: RIGHT HEART CATH;  Surgeon: Jolaine Artist, MD;  Location: [facility];  Service: Cardiovascular;  Laterality: N/A;   right knee arthroscopy     SKIN SPLIT GRAFT Right 03/04/2018   Procedure: POSSIBLE SPLIT THICKNESS SKIN GRAFT;  Surgeon: [name], MD;  Location: [facility];  Service: Orthopedics;  Laterality: Right;   SKIN SPLIT GRAFT Right 04/01/2018   Procedure: REPEAT IRRIGATION AND DEBRIDEMENT RIGHT LEG, APPLY SPLIT THICKNESS SKIN GRAFT;  Surgeon: [name], MD;  Location: [facility];  Service: Orthopedics;  Laterality: Right;   TONSILLECTOMY AND ADENOIDECTOMY      Outpatient Medications Prior to Visit  Medication Sig Dispense Refill   acetaminophen (TYLENOL) 325 MG tablet Take 2 tablets (650 mg total) by mouth every 6 (six) hours as needed for moderate pain (or Fever >/= 101). 20 tablet 0   Alcohol Swabs (B-D SINGLE USE SWABS REGULAR) PADS UAD   E11.9 100 each 3   allopurinol (ZYLOPRIM) 100 MG tablet Take 2 tablets (200 mg total) by mouth daily. 180 tablet 1   apixaban (ELIQUIS) 5 MG TABS tablet Take 1 tablet (5 mg total) by mouth 2 (two) times daily. 60 tablet 1   atorvastatin (LIPITOR) 40 MG tablet Take 1 tablet (40 mg total) by mouth at bedtime. 90 tablet 2   Blood Glucose Calibration (TRUE METRIX LEVEL 1) Low SOLN UAD  E11.9 1 each 3   Budesonide (PULMICORT FLEXHALER) 90 MCG/ACT inhaler Inhale 1 puff into the lungs 2 (two) times daily.     budesonide-formoterol (SYMBICORT)  160-4.5 MCG/ACT inhaler one - two inhalations every 12 hours; gargle and spit after use 1 Inhaler 4   calcium carbonate (TUMS - DOSED IN MG ELEMENTAL CALCIUM) 500 MG chewable tablet Chew 2 tablets by mouth daily as needed for indigestion or heartburn.     cholecalciferol (VITAMIN D3) 25 MCG (1000 UNIT) tablet Take 1 tablet (1,000 Units total) by mouth daily. 90 tablet 1   COLCHICINE PO Take 1.2 mg by mouth as needed. 2 tablets     cyanocobalamin (,VITAMIN B-12,) 1000 MCG/ML injection INJECT 1ML INTO MUSCLE EVERY 30 DAYS 10 mL 5   diclofenac sodium (VOLTAREN) 1 %  GEL Apply 2 g topically 4 (four) times daily. 100 g 11   famotidine (PEPCID) 20 MG tablet Take 1 tablet (20 mg total) by mouth 2 (two) times daily. 180 tablet 3   FEROSUL 325 (65 Fe) MG tablet TAKE 1 TABLET BY MOUTH EVERY MONDAY, WEDNESDAY, AND FRIDAY 60 tablet 0   folic acid (FOLVITE) 132 MCG tablet Take 1 tablet (400 mcg total) by mouth at bedtime. Take 400 mcg by mouth at bedtime. (Patient taking differently: Take 400 mcg by mouth at bedtime. Take 400 mcg by mouth at bedtime.) 90 tablet 1   gabapentin (NEURONTIN) 100 MG capsule TAKE 1 CAPSULE(100 MG) BY MOUTH THREE TIMES DAILY 90 capsule 1   hydrOXYzine (ATARAX/VISTARIL) 25 MG tablet Take 1 tablet (25 mg total) by mouth 3 (three) times daily as needed for itching. 30 tablet 0   montelukast (SINGULAIR) 10 MG tablet TAKE 1 TABLET(10 MG) BY MOUTH AT BEDTIME 30 tablet 5   nebivolol (BYSTOLIC) 5 MG tablet Take 1 tablet (5 mg total) by mouth daily. 30 tablet 3   Oxcarbazepine (TRILEPTAL) 300 MG tablet Take 1 tablet (300 mg total) by mouth 2 (two) times daily. 180 tablet 1   polyethylene glycol (MIRALAX / GLYCOLAX) 17 g packet Take 17 g by mouth as needed.     potassium chloride SA (KLOR-CON) 20 MEQ tablet Take 1 tablet (20 mEq total) by mouth daily. 10 tablet 0   senna-docusate (SENOKOT-S) 8.6-50 MG tablet Take 1 tablet by mouth at bedtime as needed for mild constipation. 30 tablet 0    spironolactone (ALDACTONE) 25 MG tablet TAKE 1 TABLET(25 MG) BY MOUTH DAILY 90 tablet 0   Torsemide 40 MG TABS Take 80 mg by mouth every morning AND 60 mg every evening. 135 tablet 2   VENTOLIN HFA 108 (90 Base) MCG/ACT inhaler Inhale 2 puffs into the lungs every 4 (four) hours as needed for wheezing.     Nutritional Supplements (,FEEDING SUPPLEMENT, PROSOURCE PLUS) liquid Take 30 mLs by mouth 2 (two) times daily between meals. (Patient not taking: Reported on 12/20/2020)     No facility-administered medications prior to visit.    Allergies  Allergen Reactions   Penicillins Rash and Other (See Comments)    She was told not to take it anymore. Has patient had a PCN reaction causing immediate rash, facial/tongue/throat swelling, SOB or lightheadedness with hypotension: Yes Has patient had a PCN reaction causing severe rash involving mucus membranes or skin necrosis: No Has patient had a PCN reaction that required hospitalization: No Has patient had a PCN reaction occurring within the last 10 years: No If all of the above answers are "NO", then may proceed with Cephalosporin use.'   Shellfish Allergy Anaphylaxis   Sulfonamide Derivatives Anaphylaxis and Other (See Comments)   Hydrochlorothiazide W-Triamterene Other (See Comments)    Hypokalemia   Lotensin [Benazepril Hcl] Hives   Dipyridamole Other (See Comments)    Unknown reaction   Estrogens Other (See Comments)    Unknown reaction   Hydrochlorothiazide Other (See Comments)    Unknown reaction   Latex Other (See Comments)    Unknown reaction - stated previously during surgery gloves had to be changed, but unsure if it is still an allergy or not.    Metronidazole Other (See Comments)    Unknown reaction   Other Itching    PICKLES   Spironolactone Other (See Comments)    UNSPECIFIED > "kidney problems"   Sulfa Antibiotics Other (See Comments)  Unknown reaction   Torsemide Other (See Comments)    Unknown reaction   Valsartan  Other (See Comments)    Unknown reaction   Mustard [Allyl Isothiocyanate] Itching    Family History  Problem Relation Age of Onset   Colon cancer Mother    Prostate cancer Father    Colon cancer Father    Diabetes Maternal Aunt    Breast cancer Maternal Aunt    Diabetes Maternal Uncle    Diabetes Paternal Aunt    Stroke Paternal Aunt        > 55   Heart disease Paternal Aunt    Diabetes Paternal Uncle    Breast cancer Maternal Aunt         X 2   Breast cancer Cousin     Social History   Tobacco Use   Smoking status: Never   Smokeless tobacco: Never  Vaping Use   Vaping Use: Never used  Substance Use Topics   Alcohol use: No    Alcohol/week: 0.0 standard drinks   Drug use: No    ROS: As per history of present illness, otherwise negative  BP 129/70   Pulse 70   Ht 5\' 4"  (1.626 m)   Wt (!) 351 lb (159.2 kg)   SpO2 92%   BMI 60.25 kg/m  Gen: awake, alert, NAD HEENT: anicteric  CV: RRR, no mrg Pulm: CTA b/l Abd: soft, obese, NT/ND, +BS throughout Ext: no c/c, bilateral lymphedema Neuro: nonfocal   RELEVANT LABS AND IMAGING: CBC    Component Value Date/Time   WBC 5.3 12/17/2020 1212   RBC 3.84 (L) 12/17/2020 1212   HGB 11.8 (L) 12/17/2020 1212   HGB 11.4 (L) 12/10/2020 1015   HCT 38.6 12/17/2020 1212   PLT 110 (L) 12/17/2020 1212   PLT 111 (L) 12/10/2020 1015   MCV 100.5 (H) 12/17/2020 1212   MCH 30.7 12/17/2020 1212   MCHC 30.6 12/17/2020 1212   RDW 15.6 (H) 12/17/2020 1212   LYMPHSABS 1.1 12/17/2020 1212   MONOABS 0.5 12/17/2020 1212   EOSABS 0.1 12/17/2020 1212   BASOSABS 0.1 12/17/2020 1212    CMP     Component Value Date/Time   NA 138 12/17/2020 1212   NA 145 (H) 10/25/2012 1117   K 4.0 12/17/2020 1212   CL 102 12/17/2020 1212   CO2 27 12/17/2020 1212   GLUCOSE 123 (H) 12/17/2020 1212   BUN 16 12/17/2020 1212   BUN 24 10/25/2012 1117   CREATININE 1.19 (H) 12/17/2020 1212   CREATININE 1.28 (H) 12/10/2020 1015   CALCIUM 8.8 (L)  12/17/2020 1212   PROT 6.6 12/10/2020 1015   PROT 6.4 10/25/2012 1117   ALBUMIN 3.5 12/10/2020 1015   ALBUMIN 4.1 10/25/2012 1117   AST 17 12/10/2020 1015   ALT 19 12/10/2020 1015   ALKPHOS 102 12/10/2020 1015   BILITOT 0.6 12/10/2020 1015   GFRNONAA 49 (L) 12/17/2020 1212   GFRNONAA 45 (L) 12/10/2020 1015   GFRAA 44 (L) 02/28/2019 0252    ASSESSMENT/PLAN: 70 year old female with a past medical history of GERD, strong family history of colorectal cancer (mother and father), obesity, history of DVT on Eliquis, mild to moderate pulmonary hypertension, sleep apnea, diabetes, CKD, hypertension, hyperlipidemia who is seen to discuss screening colonoscopy and GERD.    CRC screening/family history of colon cancer in 2 first-degree relatives (mother and father) --we discussed colonoscopy today including the risks, benefits and alternatives.  Her medical comorbidities make colonoscopy higher than baseline risk  and she understands that this would need to be performed in the outpatient hospital setting with monitored anesthesia care.  After reviewing this she wishes to proceed and thus we will schedule these procedures at [facility] --Colonoscopy in the outpatient hospital setting due to family history of colon cancer with monitored anesthesia care  2.  GERD --mostly controlled with some breakthrough heartburn on famotidine 20 mg twice daily.  Mild nausea associated.  Several years ago she was having more dyspeptic symptoms which led to gastric emptying scan which was normal at that time.  Prior to initiating PPI given medical renal disease we will perform upper endoscopy on the same day as colonoscopy to rule out esophagitis and other complications of GERD. --EGD on the same day as colonoscopy as above --Continue famotidine 20 mg twice daily  3.  Chronic anticoagulation/history of DVT  -- will hold Eliquis 2 days prior to endoscopic procedures - will instruct when and how to resume after procedure.  Benefits and risks of procedure explained including risks of bleeding, perforation, infection, missed lesions, reactions to medications and possible need for hospitalization and surgery for complications. Additional rare but real risk of stroke or other vascular clotting events off Eliquis also explained and need to seek urgent help if any signs of these problems occur. Will communicate by phone or EMR with patient's  prescribing provider to confirm that holding Eliquis is reasonable in this case.      JK:[id number], Claudina Lick, [name],  [place] [address]

## 2020-12-21 ENCOUNTER — Other Ambulatory Visit: Payer: Self-pay | Admitting: Internal Medicine

## 2020-12-23 ENCOUNTER — Telehealth: Payer: Self-pay | Admitting: Pulmonary Disease

## 2020-12-23 ENCOUNTER — Telehealth: Payer: Self-pay | Admitting: *Deleted

## 2020-12-23 DIAGNOSIS — I5033 Acute on chronic diastolic (congestive) heart failure: Secondary | ICD-10-CM | POA: Diagnosis not present

## 2020-12-23 DIAGNOSIS — E1122 Type 2 diabetes mellitus with diabetic chronic kidney disease: Secondary | ICD-10-CM | POA: Diagnosis not present

## 2020-12-23 DIAGNOSIS — J9622 Acute and chronic respiratory failure with hypercapnia: Secondary | ICD-10-CM | POA: Diagnosis not present

## 2020-12-23 DIAGNOSIS — I2510 Atherosclerotic heart disease of native coronary artery without angina pectoris: Secondary | ICD-10-CM | POA: Diagnosis not present

## 2020-12-23 DIAGNOSIS — I130 Hypertensive heart and chronic kidney disease with heart failure and stage 1 through stage 4 chronic kidney disease, or unspecified chronic kidney disease: Secondary | ICD-10-CM | POA: Diagnosis not present

## 2020-12-23 DIAGNOSIS — J45909 Unspecified asthma, uncomplicated: Secondary | ICD-10-CM | POA: Diagnosis not present

## 2020-12-23 DIAGNOSIS — N1832 Chronic kidney disease, stage 3b: Secondary | ICD-10-CM | POA: Diagnosis not present

## 2020-12-23 DIAGNOSIS — J9621 Acute and chronic respiratory failure with hypoxia: Secondary | ICD-10-CM | POA: Diagnosis not present

## 2020-12-23 DIAGNOSIS — G4733 Obstructive sleep apnea (adult) (pediatric): Secondary | ICD-10-CM

## 2020-12-23 DIAGNOSIS — D631 Anemia in chronic kidney disease: Secondary | ICD-10-CM | POA: Diagnosis not present

## 2020-12-23 NOTE — Telephone Encounter (Signed)
I called the patient and she wants to know if she still needs to come in for her OV tomorrow as she is not sure if you still need to do any further testing and her ONO is already completed. The patient said she was told to stop using her Bipap but I do not see that three is any documentation to stop the Bipap.   Please advise if she needs an OV?

## 2020-12-23 NOTE — Telephone Encounter (Signed)
April [name], with [facility] walked into the office stating that this pt is curently on NIV and this is being denied by her insurance. OV notes from [place] indicate that she is on BIPAP, however, what she has at home and is using is the NIV. Per Ange, we can either call pt's insurance and ask for a peer to peer, or sent in order for BIPAP machine for this pt. April [name] would suggest settings of 5-15 with backup rate of 10 if we decide to go with new BIPAP order. Please advise, thanks!

## 2020-12-24 ENCOUNTER — Ambulatory Visit: Payer: Medicare PPO | Admitting: Pulmonary Disease

## 2020-12-24 DIAGNOSIS — J9622 Acute and chronic respiratory failure with hypercapnia: Secondary | ICD-10-CM | POA: Diagnosis not present

## 2020-12-24 DIAGNOSIS — N1832 Chronic kidney disease, stage 3b: Secondary | ICD-10-CM | POA: Diagnosis not present

## 2020-12-24 DIAGNOSIS — J45909 Unspecified asthma, uncomplicated: Secondary | ICD-10-CM | POA: Diagnosis not present

## 2020-12-24 DIAGNOSIS — I5033 Acute on chronic diastolic (congestive) heart failure: Secondary | ICD-10-CM | POA: Diagnosis not present

## 2020-12-24 DIAGNOSIS — I130 Hypertensive heart and chronic kidney disease with heart failure and stage 1 through stage 4 chronic kidney disease, or unspecified chronic kidney disease: Secondary | ICD-10-CM | POA: Diagnosis not present

## 2020-12-24 DIAGNOSIS — J9621 Acute and chronic respiratory failure with hypoxia: Secondary | ICD-10-CM | POA: Diagnosis not present

## 2020-12-24 DIAGNOSIS — I2510 Atherosclerotic heart disease of native coronary artery without angina pectoris: Secondary | ICD-10-CM | POA: Diagnosis not present

## 2020-12-24 DIAGNOSIS — D631 Anemia in chronic kidney disease: Secondary | ICD-10-CM | POA: Diagnosis not present

## 2020-12-24 DIAGNOSIS — E1122 Type 2 diabetes mellitus with diabetic chronic kidney disease: Secondary | ICD-10-CM | POA: Diagnosis not present

## 2020-12-24 NOTE — Telephone Encounter (Signed)
Auto BiPAP minimum pressure of 5, maximum pressure of 20, pressure support of 4 with a backup rate of 10

## 2020-12-25 ENCOUNTER — Telehealth: Payer: Self-pay | Admitting: Internal Medicine

## 2020-12-25 DIAGNOSIS — I2510 Atherosclerotic heart disease of native coronary artery without angina pectoris: Secondary | ICD-10-CM | POA: Diagnosis not present

## 2020-12-25 DIAGNOSIS — I5033 Acute on chronic diastolic (congestive) heart failure: Secondary | ICD-10-CM | POA: Diagnosis not present

## 2020-12-25 DIAGNOSIS — D631 Anemia in chronic kidney disease: Secondary | ICD-10-CM | POA: Diagnosis not present

## 2020-12-25 DIAGNOSIS — N1832 Chronic kidney disease, stage 3b: Secondary | ICD-10-CM | POA: Diagnosis not present

## 2020-12-25 DIAGNOSIS — J9622 Acute and chronic respiratory failure with hypercapnia: Secondary | ICD-10-CM | POA: Diagnosis not present

## 2020-12-25 DIAGNOSIS — J9621 Acute and chronic respiratory failure with hypoxia: Secondary | ICD-10-CM | POA: Diagnosis not present

## 2020-12-25 DIAGNOSIS — I130 Hypertensive heart and chronic kidney disease with heart failure and stage 1 through stage 4 chronic kidney disease, or unspecified chronic kidney disease: Secondary | ICD-10-CM | POA: Diagnosis not present

## 2020-12-25 DIAGNOSIS — E1122 Type 2 diabetes mellitus with diabetic chronic kidney disease: Secondary | ICD-10-CM | POA: Diagnosis not present

## 2020-12-25 DIAGNOSIS — J45909 Unspecified asthma, uncomplicated: Secondary | ICD-10-CM | POA: Diagnosis not present

## 2020-12-25 NOTE — Telephone Encounter (Signed)
Home Health verbal orders-caller/Agency: [facility] number: [phone]  Requesting OT/PT/Skilled nursing/Social Work/Speech: [place] [facility]  Reason: Ot requesting authorization for a home health aide

## 2020-12-25 NOTE — Telephone Encounter (Signed)
I have placed the order for the BIPAP to [facility].  Nothing further is needed.

## 2020-12-25 NOTE — Telephone Encounter (Signed)
Verbals given today. °

## 2020-12-25 NOTE — Telephone Encounter (Signed)
Okay 

## 2020-12-27 ENCOUNTER — Other Ambulatory Visit: Payer: Self-pay | Admitting: Internal Medicine

## 2020-12-27 DIAGNOSIS — I130 Hypertensive heart and chronic kidney disease with heart failure and stage 1 through stage 4 chronic kidney disease, or unspecified chronic kidney disease: Secondary | ICD-10-CM | POA: Diagnosis not present

## 2020-12-27 DIAGNOSIS — I5033 Acute on chronic diastolic (congestive) heart failure: Secondary | ICD-10-CM | POA: Diagnosis not present

## 2020-12-27 DIAGNOSIS — D631 Anemia in chronic kidney disease: Secondary | ICD-10-CM | POA: Diagnosis not present

## 2020-12-27 DIAGNOSIS — N1832 Chronic kidney disease, stage 3b: Secondary | ICD-10-CM | POA: Diagnosis not present

## 2020-12-27 DIAGNOSIS — I2510 Atherosclerotic heart disease of native coronary artery without angina pectoris: Secondary | ICD-10-CM | POA: Diagnosis not present

## 2020-12-27 DIAGNOSIS — E1122 Type 2 diabetes mellitus with diabetic chronic kidney disease: Secondary | ICD-10-CM | POA: Diagnosis not present

## 2020-12-27 DIAGNOSIS — J45909 Unspecified asthma, uncomplicated: Secondary | ICD-10-CM | POA: Diagnosis not present

## 2020-12-27 DIAGNOSIS — J9622 Acute and chronic respiratory failure with hypercapnia: Secondary | ICD-10-CM | POA: Diagnosis not present

## 2020-12-27 DIAGNOSIS — J9621 Acute and chronic respiratory failure with hypoxia: Secondary | ICD-10-CM | POA: Diagnosis not present

## 2020-12-30 ENCOUNTER — Other Ambulatory Visit: Payer: Self-pay | Admitting: Physician Assistant

## 2020-12-30 DIAGNOSIS — D696 Thrombocytopenia, unspecified: Secondary | ICD-10-CM

## 2020-12-31 ENCOUNTER — Other Ambulatory Visit: Payer: Self-pay

## 2020-12-31 ENCOUNTER — Inpatient Hospital Stay: Payer: Medicare PPO

## 2020-12-31 DIAGNOSIS — D649 Anemia, unspecified: Secondary | ICD-10-CM | POA: Diagnosis not present

## 2020-12-31 DIAGNOSIS — Z7901 Long term (current) use of anticoagulants: Secondary | ICD-10-CM | POA: Diagnosis not present

## 2020-12-31 DIAGNOSIS — D696 Thrombocytopenia, unspecified: Secondary | ICD-10-CM

## 2020-12-31 DIAGNOSIS — I82403 Acute embolism and thrombosis of unspecified deep veins of lower extremity, bilateral: Secondary | ICD-10-CM

## 2020-12-31 DIAGNOSIS — N189 Chronic kidney disease, unspecified: Secondary | ICD-10-CM | POA: Diagnosis not present

## 2020-12-31 DIAGNOSIS — I130 Hypertensive heart and chronic kidney disease with heart failure and stage 1 through stage 4 chronic kidney disease, or unspecified chronic kidney disease: Secondary | ICD-10-CM | POA: Diagnosis not present

## 2020-12-31 LAB — CMP (CANCER CENTER ONLY)
ALT: 17 U/L (ref 0–44)
AST: 20 U/L (ref 15–41)
Albumin: 3.9 g/dL (ref 3.5–5.0)
Alkaline Phosphatase: 102 U/L (ref 38–126)
Anion gap: 8 (ref 5–15)
BUN: 19 mg/dL (ref 8–23)
CO2: 31 mmol/L (ref 22–32)
Calcium: 8.7 mg/dL — ABNORMAL LOW (ref 8.9–10.3)
Chloride: 100 mmol/L (ref 98–111)
Creatinine: 1.52 mg/dL — ABNORMAL HIGH (ref 0.44–1.00)
GFR, Estimated: 37 mL/min — ABNORMAL LOW (ref >60)
Glucose, Bld: 131 mg/dL — ABNORMAL HIGH (ref 70–99)
Potassium: 3.9 mmol/L (ref 3.5–5.1)
Sodium: 139 mmol/L (ref 135–145)
Total Bilirubin: 0.7 mg/dL (ref 0.3–1.2)
Total Protein: 7.2 g/dL (ref 6.5–8.1)

## 2020-12-31 LAB — CBC WITH DIFFERENTIAL (CANCER CENTER ONLY)
Abs Immature Granulocytes: 0.04 10*3/uL (ref 0.00–0.07)
Basophils Absolute: 0.1 10*3/uL (ref 0.0–0.1)
Basophils Relative: 1 %
Eosinophils Absolute: 0.1 10*3/uL (ref 0.0–0.5)
Eosinophils Relative: 2 %
HCT: 39.5 % (ref 36.0–46.0)
Hemoglobin: 12.2 g/dL (ref 12.0–15.0)
Immature Granulocytes: 1 %
Lymphocytes Relative: 20 %
Lymphs Abs: 1 10*3/uL (ref 0.7–4.0)
MCH: 30.6 pg (ref 26.0–34.0)
MCHC: 30.9 g/dL (ref 30.0–36.0)
MCV: 99 fL (ref 80.0–100.0)
Monocytes Absolute: 0.4 10*3/uL (ref 0.1–1.0)
Monocytes Relative: 7 %
Neutro Abs: 3.5 10*3/uL (ref 1.7–7.7)
Neutrophils Relative %: 69 %
Platelet Count: 176 10*3/uL (ref 150–400)
RBC: 3.99 MIL/uL (ref 3.87–5.11)
RDW: 15.5 % (ref 11.5–15.5)
WBC Count: 5.1 10*3/uL (ref 4.0–10.5)
nRBC: 0.6 % — ABNORMAL HIGH (ref 0.0–0.2)

## 2020-12-31 LAB — HEPATITIS B CORE ANTIBODY, TOTAL: Hep B Core Total Ab: NONREACTIVE

## 2020-12-31 LAB — HEPATITIS B SURFACE ANTIBODY,QUALITATIVE: Hep B S Ab: NONREACTIVE

## 2020-12-31 LAB — HEPATITIS C ANTIBODY: HCV Ab: NONREACTIVE

## 2020-12-31 LAB — HEPATITIS B SURFACE ANTIGEN: Hepatitis B Surface Ag: NONREACTIVE

## 2020-12-31 LAB — VITAMIN B12: Vitamin B-12: 1460 pg/mL — ABNORMAL HIGH (ref 180–914)

## 2020-12-31 LAB — FOLATE: Folate: 32.7 ng/mL (ref >5.9)

## 2021-01-01 ENCOUNTER — Inpatient Hospital Stay: Payer: Medicare PPO

## 2021-01-01 DIAGNOSIS — D631 Anemia in chronic kidney disease: Secondary | ICD-10-CM | POA: Diagnosis not present

## 2021-01-01 DIAGNOSIS — I5033 Acute on chronic diastolic (congestive) heart failure: Secondary | ICD-10-CM | POA: Diagnosis not present

## 2021-01-01 DIAGNOSIS — J45909 Unspecified asthma, uncomplicated: Secondary | ICD-10-CM | POA: Diagnosis not present

## 2021-01-01 DIAGNOSIS — I2510 Atherosclerotic heart disease of native coronary artery without angina pectoris: Secondary | ICD-10-CM | POA: Diagnosis not present

## 2021-01-01 DIAGNOSIS — J9621 Acute and chronic respiratory failure with hypoxia: Secondary | ICD-10-CM | POA: Diagnosis not present

## 2021-01-01 DIAGNOSIS — J9622 Acute and chronic respiratory failure with hypercapnia: Secondary | ICD-10-CM | POA: Diagnosis not present

## 2021-01-01 DIAGNOSIS — E1122 Type 2 diabetes mellitus with diabetic chronic kidney disease: Secondary | ICD-10-CM | POA: Diagnosis not present

## 2021-01-01 DIAGNOSIS — I130 Hypertensive heart and chronic kidney disease with heart failure and stage 1 through stage 4 chronic kidney disease, or unspecified chronic kidney disease: Secondary | ICD-10-CM | POA: Diagnosis not present

## 2021-01-01 DIAGNOSIS — N1832 Chronic kidney disease, stage 3b: Secondary | ICD-10-CM | POA: Diagnosis not present

## 2021-01-01 LAB — HOMOCYSTEINE: Homocysteine: 13.5 umol/L (ref 0.0–17.2)

## 2021-01-02 ENCOUNTER — Encounter: Payer: Self-pay | Admitting: Sports Medicine

## 2021-01-02 ENCOUNTER — Other Ambulatory Visit: Payer: Self-pay

## 2021-01-02 ENCOUNTER — Telehealth: Payer: Self-pay | Admitting: Physician Assistant

## 2021-01-02 ENCOUNTER — Ambulatory Visit: Payer: Medicare PPO | Admitting: Sports Medicine

## 2021-01-02 DIAGNOSIS — B351 Tinea unguium: Secondary | ICD-10-CM

## 2021-01-02 DIAGNOSIS — I890 Lymphedema, not elsewhere classified: Secondary | ICD-10-CM

## 2021-01-02 DIAGNOSIS — M79674 Pain in right toe(s): Secondary | ICD-10-CM

## 2021-01-02 DIAGNOSIS — M79675 Pain in left toe(s): Secondary | ICD-10-CM

## 2021-01-02 DIAGNOSIS — E1142 Type 2 diabetes mellitus with diabetic polyneuropathy: Secondary | ICD-10-CM

## 2021-01-02 LAB — METHYLMALONIC ACID, SERUM: Methylmalonic Acid, Quantitative: 208 nmol/L (ref 0–378)

## 2021-01-02 NOTE — Progress Notes (Addendum)
'@Patient'  ID: April [name], female    DOB: [birth year], 70 y.o.   MRN: [id number]  Chief Complaint  Patient presents with   Follow-up    Referring provider: [name], MD  HPI: 70 year old female, never smoker followed for nocturnal hypoxia, pulmonary HTN, obesity hypoventilation syndrome, and asthma. She is a patient of Dr. Judson Roch and was last seen in office on 10/02/2020. Past medical history significant for CHF, former DVT on chronic anticoagulation, HTN, CAD, GERD, PUP, DM II, morbid obesity, CKD stage 3, HLD.  TEST/EVENTS:  05/07/2020 CTA chest: Mild cardiomegaly suggestive of PA HTN.  Atherosclerosis.  No lymphadenopathy. Minimal ground-glass opacity lung bases. Slight suboptimal opacification of main PA without central or proximal segmental PE. Small hiatal hernia.  11/15/2020 CXR 2 view: Low lung volumes.  No evidence of acute infiltrate, pleural effusion or pneumothorax.  Cardiac silhouette mildly enlarged.  Stable mild appearance of bilateral perihilar pulmonary vasculature.  Moderate severity calcification of aortic arch. 11/18/2020 echocardiogram: EF 65-70%. LVH. Grad I DD. Interventricular septum flattened c/w RV pressure and volume overload.  11/19/2020 Right heart cath: mild to mod pulm HTN. Minimally elevate PCWP. Normal CO.  12/10/2019 Split night sleep study: negative for significant sleep disordered breathing. Nocturnal desaturations to SpO2 low 84%. Required 1 L O2. Recommended continued use of BiPAP for CO2 blow off and 1 L supplemental O2 at night. ONO ordered.  10/02/2020: OV with Dr. Ander Slade. Scheduled in lab split-night. Lunesta low dose for sleep. Weight loss advised but limits with severe arthritis. Encouraged to decrease naps and consolidate sleep at night. F/u in 3-4 months.   11/15/2020 - 11/23/2018: Hospital admission for acute on chronic CHF exacerbation and acute on chronic hypoxic hypercapnic respiratory failure.  pH 7.29 PCO2 of 80 and PO2 of 60. IV  diuresis with Lasix.  Right heart cath on 10/19.  PCCM consulted with recommendations for BiPAP versus NIPPV upon discharge.  01/03/2021: Today - f/u  Patient presents today with her husband for follow up split night sleep study. Her sleep study did not show any evidence of obstructive sleep apnea but she did have desaturations to SpO2 low of 84% and required 1L [place]. Nocturnal supplemental oxygen was ordered at 1L and patient was instructed to continue on BiPAP for CO2 blow off, as she was previously hospitalized for hypercapnia. She was supposed to have an ONO but reports today that they never did one. Today, she reports her breathing is well-controlled with no change from her baseline SOB upon exertion. She denies cough, orthopnea, PND, wheezing, chest pain, or increased leg swelling. She wears her BiPAP nightly along with her supplemental oxygen 1 L. She denies any persistent daytime fatigue symptoms, confusion, or drowsiness during the day. She continues on her Symbicort inhaler Twice daily and singulair at night. She was previously told to take her pulmicort inhaler Twice daily as well. She rarely uses her rescue inhaler. She is on chronic anticoagulation for former DVT and followed by hematology. No hematochezia, hematuria, excessive bleeding/bruising, or hemoptysis.   She was concerned about the denial letter from her insurance company. This denial was for NIV, not BiPAP therapy. Per Dr. Judson Roch previous telephone notes, BiPAP therapy appropriate. Her DME company contacted the office 11/23 and received the appropriate orders for auto BiPAP. Overall, she feels well and offers no further complaints.    Allergies  Allergen Reactions   Penicillins Rash and Other (See Comments)    She was told not to take it anymore.  Has patient had a PCN reaction causing immediate rash, facial/tongue/throat swelling, SOB or lightheadedness with hypotension: Yes Has patient had a PCN reaction causing severe rash  involving mucus membranes or skin necrosis: No Has patient had a PCN reaction that required hospitalization: No Has patient had a PCN reaction occurring within the last 10 years: No If all of the above answers are "NO", then may proceed with Cephalosporin use.'   Shellfish Allergy Anaphylaxis   Sulfonamide Derivatives Anaphylaxis and Other (See Comments)   Hydrochlorothiazide W-Triamterene Other (See Comments)    Hypokalemia   Lotensin [Benazepril Hcl] Hives   Dipyridamole Other (See Comments)    Unknown reaction   Estrogens Other (See Comments)    Unknown reaction   Hydrochlorothiazide Other (See Comments)    Unknown reaction, Hypokalemia?   Latex Other (See Comments)    Unknown reaction - stated previously during surgery gloves had to be changed, but unsure if it is still an allergy or not.    Metronidazole Other (See Comments)    Unknown reaction   Other Itching    PICKLES   Spironolactone Other (See Comments)    UNSPECIFIED > "kidney problems"   Torsemide Other (See Comments)    Unknown reaction   Valsartan Other (See Comments)    Unknown reaction   Mustard [Allyl Isothiocyanate] Itching    Immunization History  Administered Date(s) Administered   Fluad Quad(high Dose 65+) 10/28/2018, 10/18/2020   Influenza Split 12/03/2010, 11/03/2011   Influenza Whole 11/16/2006, 11/22/2007, 10/31/2008, 10/29/2009   Influenza, High Dose Seasonal PF 10/10/2015, 11/06/2016, 11/16/2017, 11/08/2019   Influenza,inj,Quad PF,6+ Mos 10/19/2012, 10/25/2013, 10/29/2014   Influenza-Unspecified 10/23/2016, 11/18/2016   Moderna Sars-Covid-2 Vaccination 03/13/2019, 04/10/2019, 12/06/2019   Pfizer Covid-19 Vaccine Bivalent Booster 53yr & up 11/08/2020   Pneumococcal Conjugate-13 06/07/2015   Pneumococcal Polysaccharide-23 03/24/2005, 01/04/2012, 06/29/2017   Td 06/24/2009   Tdap 10/09/2014   Zoster Recombinat (Shingrix) 08/11/2016, 10/23/2016   Zoster, Live 01/15/2012    Past Medical History:   Diagnosis Date   Anemia    Asthma    CAD (coronary artery disease)    Carpal tunnel syndrome    Cellulitis of both lower extremities 04/11/2015   CHF (congestive heart failure) (HCC)    Chronic kidney disease    [facility]   Colon polyp, hyperplastic [id number]  Complication of anesthesia 1999   svt with renal calculi surgery, no problems since   CTS (carpal tunnel syndrome)    bilateral   Diabetes mellitus    Eczema    GERD (gastroesophageal reflux disease)    History of kidney stones 1999   Hyperlipidemia    Hypertension    [place] 04[place]   Right lateral leg No evidence of an infection Monitor closely Keep edema controlled    Leg ulcer ([facility]    right lower   Meralgia paresthetica    Dr. YKrista Blue  Morbid obesity ([place]    Neuropathy    toes and legs   Osteoarthrosis, unspecified whether generalized or localized, lower leg    knee   PUD (peptic ulcer disease)    Shortness of breath dyspnea    with exertion   Sleep apnea    per progress note 02/25/2018   Type II or unspecified type diabetes mellitus without mention of complication, not stated as uncontrolled    Unspecified hereditary and idiopathic peripheral neuropathy    Urticaria    Vitamin B12 deficiency    Wears glasses  Wound cellulitis    right upper leg, healing well    Tobacco History: Social History   Tobacco Use  Smoking Status Never  Smokeless Tobacco Never   Counseling given: Not Answered    Outpatient Medications Prior to Visit  Medication Sig Dispense Refill   Alcohol Swabs (DROPSAFE ALCOHOL PREP) 70 % PADS USE TOPICALLY AS DIRECTED 300 each 3   allopurinol (ZYLOPRIM) 100 MG tablet Take 2 tablets (200 mg total) by mouth daily. (Patient taking differently: Take 200 mg by mouth daily as needed (gout).) 180 tablet 1   apixaban (ELIQUIS) 5 MG TABS tablet Take 1 tablet (5 mg total) by mouth 2 (two) times daily. 60 tablet 1   atorvastatin (LIPITOR) 40 MG tablet  Take 1 tablet (40 mg total) by mouth at bedtime. 90 tablet 2   Blood Glucose Calibration (TRUE METRIX LEVEL 1) Low SOLN UAD  E11.9 1 each 3   Blood Glucose Monitoring Suppl (TRUE METRIX METER) DEVI True Metrix Level 1 solution     budesonide-formoterol (SYMBICORT) 160-4.5 MCG/ACT inhaler one - two inhalations every 12 hours; gargle and spit after use (Patient not taking: Reported on 03/13/2021) 1 Inhaler 4   calcium carbonate (TUMS - DOSED IN MG ELEMENTAL CALCIUM) 500 MG chewable tablet Chew 2 tablets by mouth daily as needed for indigestion or heartburn.     cholecalciferol (VITAMIN D3) 25 MCG (1000 UNIT) tablet Take 1 tablet (1,000 Units total) by mouth daily. 90 tablet 1   cyanocobalamin (,VITAMIN B-12,) 1000 MCG/ML injection INJECT 1ML INTO MUSCLE EVERY 30 DAYS 10 mL 5   famotidine (PEPCID) 20 MG tablet Take 1 tablet (20 mg total) by mouth 2 (two) times daily. 180 tablet 3   gabapentin (NEURONTIN) 100 MG capsule TAKE 1 CAPSULE(100 MG) BY MOUTH THREE TIMES DAILY 90 capsule 1   hydrOXYzine (ATARAX/VISTARIL) 25 MG tablet Take 1 tablet (25 mg total) by mouth 3 (three) times daily as needed for itching. 30 tablet 0   montelukast (SINGULAIR) 10 MG tablet TAKE 1 TABLET(10 MG) BY MOUTH AT BEDTIME 30 tablet 5   polyethylene glycol (MIRALAX / GLYCOLAX) 17 g packet Take 17 g by mouth daily as needed for moderate constipation.     VENTOLIN HFA 108 (90 Base) MCG/ACT inhaler Inhale 2 puffs into the lungs every 4 (four) hours as needed for wheezing.     acetaminophen (TYLENOL) 325 MG tablet Take 2 tablets (650 mg total) by mouth every 6 (six) hours as needed for moderate pain (or Fever >/= 101). 20 tablet 0   acetaZOLAMIDE (DIAMOX) 250 MG tablet acetazolamide 250 mg tablet     Budesonide (PULMICORT FLEXHALER) 90 MCG/ACT inhaler Inhale 1 puff into the lungs 2 (two) times daily as needed for shortness of breath or wheezing.     cefdinir (OMNICEF) 300 MG capsule cefdinir 300 mg capsule     COLCHICINE PO Take 1.2 mg  by mouth as needed. 2 tablets     diclofenac sodium (VOLTAREN) 1 % GEL Apply 2 g topically 4 (four) times daily. 100 g 11   eszopiclone (LUNESTA) 1 MG TABS tablet eszopiclone 1 mg tablet     FEROSUL 325 (65 Fe) MG tablet TAKE 1 TABLET BY MOUTH EVERY MONDAY, WEDNESDAY, AND FRIDAY 60 tablet 0   folic acid (FOLVITE) 226 MCG tablet Take 1 tablet (400 mcg total) by mouth at bedtime. Take 400 mcg by mouth at bedtime. (Patient taking differently: Take 400 mcg by mouth at bedtime. Take 400 mcg by mouth at  bedtime.) 90 tablet 1   nebivolol (BYSTOLIC) 5 MG tablet Take 1 tablet (5 mg total) by mouth daily. 30 tablet 3   Oxcarbazepine (TRILEPTAL) 300 MG tablet Take 1 tablet (300 mg total) by mouth 2 (two) times daily. 180 tablet 1   oxyCODONE (OXY IR/ROXICODONE) 5 MG immediate release tablet oxycodone 5 mg tablet     PEG-KCl-NaCl-NaSulf-Na Asc-C (PLENVU) 140 g SOLR Take 1 kit by mouth as directed. Use coupon: BIN: [id number] PNC: CNRX Group: [id number] ID: [id number] (Patient not taking: Reported on 03/06/2021) 1 each 0   potassium chloride SA (KLOR-CON) 20 MEQ tablet Take 1 tablet (20 mEq total) by mouth daily. 10 tablet 0   senna-docusate (SENOKOT-S) 8.6-50 MG tablet Take 1 tablet by mouth at bedtime as needed for mild constipation. 30 tablet 0   spironolactone (ALDACTONE) 25 MG tablet TAKE 1 TABLET(25 MG) BY MOUTH DAILY 90 tablet 0   Torsemide 40 MG TABS Take 80 mg by mouth every morning AND 60 mg every evening. 135 tablet 2   No facility-administered medications prior to visit.     Review of Systems:   Constitutional: No weight loss or gain, night sweats, fevers, chills, fatigue, or lassitude. HEENT: No headaches, difficulty swallowing, tooth/dental problems, or sore throat. No sneezing, itching, ear ache, nasal congestion, or post nasal drip CV:  +lower extremity edema; non-pitting; no change from baseline. No chest pain, orthopnea, PND, anasarca, dizziness, palpitations, syncope Resp: +shortness of  breath with exertion (unchanged from baseline and stable). No excess mucus or change in color of mucus. No productive or non-productive. No hemoptysis. No wheezing.  No chest wall deformity GI:  No heartburn, indigestion, abdominal pain, nausea, vomiting, diarrhea, change in bowel habits, loss of appetite, bloody stools.  GU: No dysuria, change in color of urine, urgency or frequency.  No flank pain, no hematuria  Skin: No rash, lesions, ulcerations MSK:  No joint pain or swelling.  No decreased range of motion.  No back pain. Wheelchair required for long distances; uses cane otherwise  Neuro: No dizziness or lightheadedness.  Psych: No depression or anxiety. Mood stable.     Physical Exam:  BP 120/80 (BP Location: Left Arm, Patient Position: Sitting, Cuff Size: Large)   Pulse 87   Temp 98.4 F (36.9 C) (Oral)   Ht '5\' 4"'  (1.626 m)   Wt (!) 351 lb (159.2 kg)   SpO2 93%   BMI 60.25 kg/m   GEN: Pleasant, interactive; morbidly obese; in no acute distress. HEENT:  Normocephalic and atraumatic. EACs patent bilaterally. TM pearly gray with present light reflex bilaterally. PERRLA. Sclera white. Nasal turbinates pink, moist and patent bilaterally. No rhinorrhea present. Oropharynx pink and moist, without exudate or edema. No lesions, ulcerations, or postnasal drip.  NECK:  Supple w/ fair ROM. No JVD present. Normal carotid impulses w/o bruits. Thyroid symmetrical with no goiter or nodules palpated. No lymphadenopathy.   CV: RRR, no m/r/g. Non-pitting, bilateral lower extremity edema. Pulses intact, +2 bilaterally. No cyanosis, pallor or clubbing. PULMONARY:  Unlabored, regular breathing. Clear bilaterally A&P w/o wheezes/rales/rhonchi. No accessory muscle use. No dullness to percussion. GI: BS present and normoactive. Soft, non-tender to palpation. No organomegaly or masses detected. No CVA tenderness. MSK: No erythema, warmth or tenderness. Cap refil <2 sec all extrem. No deformities or joint  swelling noted.  Neuro: A/Ox3. No focal deficits noted.   Skin: Warm, no lesions or rashe Psych: Normal affect and behavior. Judgement and thought content appropriate.     Lab  Results:  CBC    Component Value Date/Time   WBC 5.1 03/19/2021 1745   RBC 3.85 (L) 03/19/2021 1745   HGB 12.0 03/19/2021 1745   HGB 12.2 12/31/2020 0937   HCT 39.0 03/19/2021 1745   PLT 173 03/19/2021 1745   PLT 176 12/31/2020 0937   MCV 101.3 (H) 03/19/2021 1745   MCH 31.2 03/19/2021 1745   MCHC 30.8 03/19/2021 1745   RDW 15.9 (H) 03/19/2021 1745   LYMPHSABS 1.3 03/19/2021 1745   MONOABS 0.4 03/19/2021 1745   EOSABS 0.1 03/19/2021 1745   BASOSABS 0.1 03/19/2021 1745    BMET    Component Value Date/Time   NA 135 03/19/2021 1745   NA 145 (H) 10/25/2012 1117   K 3.8 03/19/2021 1745   CL 93 (L) 03/19/2021 1745   CO2 34 (H) 03/19/2021 1745   GLUCOSE 86 03/19/2021 1745   BUN 16 03/19/2021 1745   BUN 24 10/25/2012 1117   CREATININE 1.56 (H) 03/19/2021 1745   CREATININE 1.52 (H) 12/31/2020 0937   CALCIUM 8.9 03/19/2021 1745   GFRNONAA 36 (L) 03/19/2021 1745   GFRNONAA 37 (L) 12/31/2020 0937   GFRAA 44 (L) 02/28/2019 0252    BNP    Component Value Date/Time   BNP 96.7 01/10/2021 1114     Imaging:  No results found.  cyanocobalamin ((VITAMIN B-12)) injection 1,000 mcg     Date Action Dose Route User   04/04/2021 1021 Given 1,000 mcg Intramuscular (Right Deltoid) Tastet, Macario Carls, CMA       No flowsheet data found.  No results found for: NITRICOXIDE      Assessment & Plan:   Nocturnal hypoxemia due to obesity Split night sleep study with SpO2 of 84% requiring 1L O2. No evidence of OSA. ONO was previously ordered but never completed. Will reorder today. Continue BiPAP therapy for obesity hypoventilation syndrome. Significant visit time spent educating on need for nocturnal O2 and BiPAP. Verbalized understanding.   Patient Instructions  -Continue on BiPAP auto 5-20, pressure  support of 4 with backup rate of 10 for CO2 blow off and to reduce your risk of rehospitalization  Use for a minimum 6 hours a night, with goal of using entire night.  Change equipment every 30 days or as directed by DME.  Maintain clean equipment, as directed by home health agency.  Be aware of reduced alertness and do not drive or operate heavy machinery if experiencing this or drowsiness.  Exercise encouraged, as tolerated. Healthy weight management discussed.  Avoid or decrease alcohol consumption and medications that make you more sleepy, if possible.  -Continue nighttime supplemental O2 therapy at 1L. We will resend the order for your overnight oximetry test  -Continue Symbicort 1 puff Twice daily, rinse after -Change Pulmicort inhaler to 2 puffs twice a day as needed for shortness of breath or wheezing  -Continue Ventolin inhaler 2 puffs as needed every 4 hours as needed for shortness of breath or wheezing  -Continue Singulair 10 mg At bedtime  Follow up with cardiology, ortho, and hematology as scheduled.   Asthma Action Plan in place Avoid triggers, when able.  Exercise encouraged. Notify if worsening symptoms upon exertion.  Notify and seek help if symptoms unrelieved by rescue inhaler.   Follow up in 3 months with Dr. Ander Slade or APP. If symptoms do not improve or worsen, please contact office for sooner follow up or seek emergency care.    Obesity hypoventilation syndrome ([place]) Previous hospitalization related to hypercapnia. Continue  BiPAP therapy at night for CO2 blow off. Weight loss discussed; activity limited with periodic use of wheelchair. See above plan.  Morbid obesity ([place]) See above plan.  Intrinsic asthma Symptoms well-controlled on maintenance regimen. Change pulmicort to PRN. Continue Symbicort Twice daily, as previously ordered. Rescue inhaler as needed. See above plan.  Chronic diastolic (congestive) heart failure (HCC) Stable. Follow up with cardiology  as scheduled.   Leg DVT (deep venous thromboembolism), acute, bilateral (HCC) Stable. Follow up with hematology as ordered.      Clayton Bibles, NP 04/04/2021  Pt aware and understands NP's role.

## 2021-01-02 NOTE — Telephone Encounter (Signed)
I called [name] to review the lab results from 12/31/2020.  Due to prior labs that showed thrombocytopenia, patient underwent further work-up.  Fortunately, repeat CBC showed that platelet counts are back to normal limits.  There is no evidence of hepatitis B, C or HIV.  Additionally there is no evidence of nutritional deficiencies.  We will monitor patient's labs for now and see her back in approximately 6 months for a follow-up visit.  Patient expressed understanding and satisfaction with the plan provided.

## 2021-01-02 NOTE — Progress Notes (Signed)
Subjective: April [name] is a 70 y.o. female patient with history of diabetes who returns to office today complaining of long, painful nails; unable to trim. + blood clots and on Eliquis. FBS 120 yesterday and A1c was last month and per patient was good. States that she is going to see Dr. [name] about her leg.  No other issues noted.   Patient Active Problem List   Diagnosis Date Noted   Thrombocytopenia ([place]) 12/12/2020   Frequent nosebleeds 11/29/2020   Acute on chronic respiratory failure with hypoxia and hypercapnia ([place]) 11/15/2020   Obesity hypoventilation syndrome ([place]) 11/15/2020   History of DVT (deep vein thrombosis) 11/15/2020   Encounter for chronic pain management 09/05/2020   Cellulitis, leg 05/08/2020   Leg DVT (deep venous thromboembolism), acute, bilateral ([place]) 05/08/2020   SOB (shortness of breath)    Gout    Morbid obesity ([place])    Pneumonia due to COVID-19 virus 12/10/2018   COVID-19 virus infection 12/06/2018   Right hip pain 11/09/2018   Osteomyelitis of ankle or foot, acute, left ([place]) 09/07/2018   Onychomycosis 05/19/2018   Idiopathic chronic venous hypertension of right lower extremity with ulcer and inflammation ([place]) 05/19/2018   Skin lumps 01/14/2018   Bilateral leg cramps 12/14/2017   Left shoulder pain 08/14/2016   Meralgia paresthetica 07/16/2016   Chronic left-sided low back pain with left-sided sciatica 06/02/2016   Osteopenia 01/11/2016   CKD (chronic kidney disease) stage 3, GFR 30-59 ml/min (HCC) 01/02/2016   Carpal tunnel syndrome 06/07/2015   Family history of colon cancer    Diabetes mellitus with neurological manifestations ([place]) 04/18/2015   Bilateral leg edema 04/11/2015   Abnormality of gait 01/03/2015   Hereditary and idiopathic peripheral neuropathy 01/03/2015   GERD (gastroesophageal reflux disease) 06/17/2014   Hx of colonic polyps 12/15/2012   Vitamin B12 deficiency 11/03/2012   Intrinsic asthma 03/23/2012   Acute on chronic  diastolic CHF (congestive heart failure) ([place]) 02/20/2011   OSA (obstructive sleep apnea) 09/17/2010   CAD, NATIVE VESSEL 11/20/2008   Osteoarthritis of knees, bilateral 06/14/2008   Hyperlipidemia 05/10/2007   Essential hypertension 01/18/2007   Hypokalemia 04/30/2006   HX, PERSONAL, PEPTIC ULCER DISEASE 04/30/2006   Current Outpatient Medications on File Prior to Visit  Medication Sig Dispense Refill   acetaminophen (TYLENOL) 325 MG tablet Take 2 tablets (650 mg total) by mouth every 6 (six) hours as needed for moderate pain (or Fever >/= 101). 20 tablet 0   acetaZOLAMIDE (DIAMOX) 250 MG tablet acetazolamide 250 mg tablet     Alcohol Swabs (DROPSAFE ALCOHOL PREP) 70 % PADS USE TOPICALLY AS DIRECTED 300 each 3   allopurinol (ZYLOPRIM) 100 MG tablet Take 2 tablets (200 mg total) by mouth daily. 180 tablet 1   apixaban (ELIQUIS) 5 MG TABS tablet Take 1 tablet (5 mg total) by mouth 2 (two) times daily. 60 tablet 1   atorvastatin (LIPITOR) 40 MG tablet Take 1 tablet (40 mg total) by mouth at bedtime. 90 tablet 2   Blood Glucose Calibration (TRUE METRIX LEVEL 1) Low SOLN UAD  E11.9 1 each 3   Blood Glucose Monitoring Suppl (TRUE METRIX METER) DEVI True Metrix Level 1 solution     Budesonide (PULMICORT FLEXHALER) 90 MCG/ACT inhaler Inhale 1 puff into the lungs 2 (two) times daily.     budesonide-formoterol (SYMBICORT) 160-4.5 MCG/ACT inhaler one - two inhalations every 12 hours; gargle and spit after use 1 Inhaler 4   calcium carbonate (TUMS - DOSED IN MG  ELEMENTAL CALCIUM) 500 MG chewable tablet Chew 2 tablets by mouth daily as needed for indigestion or heartburn.     cefdinir (OMNICEF) 300 MG capsule cefdinir 300 mg capsule     cholecalciferol (VITAMIN D3) 25 MCG (1000 UNIT) tablet Take 1 tablet (1,000 Units total) by mouth daily. 90 tablet 1   COLCHICINE PO Take 1.2 mg by mouth as needed. 2 tablets     cyanocobalamin (,VITAMIN B-12,) 1000 MCG/ML injection INJECT 1ML INTO MUSCLE EVERY 30 DAYS  10 mL 5   diclofenac sodium (VOLTAREN) 1 % GEL Apply 2 g topically 4 (four) times daily. 100 g 11   eszopiclone (LUNESTA) 1 MG TABS tablet eszopiclone 1 mg tablet     famotidine (PEPCID) 20 MG tablet Take 1 tablet (20 mg total) by mouth 2 (two) times daily. 180 tablet 3   FEROSUL 325 (65 Fe) MG tablet TAKE 1 TABLET BY MOUTH EVERY MONDAY, WEDNESDAY, AND FRIDAY 60 tablet 0   folic acid (FOLVITE) 683 MCG tablet Take 1 tablet (400 mcg total) by mouth at bedtime. Take 400 mcg by mouth at bedtime. (Patient taking differently: Take 400 mcg by mouth at bedtime. Take 400 mcg by mouth at bedtime.) 90 tablet 1   gabapentin (NEURONTIN) 100 MG capsule TAKE 1 CAPSULE(100 MG) BY MOUTH THREE TIMES DAILY 90 capsule 1   hydrOXYzine (ATARAX/VISTARIL) 25 MG tablet Take 1 tablet (25 mg total) by mouth 3 (three) times daily as needed for itching. 30 tablet 0   montelukast (SINGULAIR) 10 MG tablet TAKE 1 TABLET(10 MG) BY MOUTH AT BEDTIME 30 tablet 5   nebivolol (BYSTOLIC) 5 MG tablet Take 1 tablet (5 mg total) by mouth daily. 30 tablet 3   Oxcarbazepine (TRILEPTAL) 300 MG tablet Take 1 tablet (300 mg total) by mouth 2 (two) times daily. 180 tablet 1   oxyCODONE (OXY IR/ROXICODONE) 5 MG immediate release tablet oxycodone 5 mg tablet     PEG-KCl-NaCl-NaSulf-Na Asc-C (PLENVU) 140 g SOLR Take 1 kit by mouth as directed. Use coupon: BIN: [id number] PNC: CNRX Group: [id number] ID: [id number] 1 each 0   polyethylene glycol (MIRALAX / GLYCOLAX) 17 g packet Take 17 g by mouth as needed.     potassium chloride SA (KLOR-CON) 20 MEQ tablet Take 1 tablet (20 mEq total) by mouth daily. 10 tablet 0   senna-docusate (SENOKOT-S) 8.6-50 MG tablet Take 1 tablet by mouth at bedtime as needed for mild constipation. 30 tablet 0   spironolactone (ALDACTONE) 25 MG tablet TAKE 1 TABLET(25 MG) BY MOUTH DAILY 90 tablet 0   Torsemide 40 MG TABS Take 80 mg by mouth every morning AND 60 mg every evening. 135 tablet 2   VENTOLIN HFA 108 (90 Base) MCG/ACT  inhaler Inhale 2 puffs into the lungs every 4 (four) hours as needed for wheezing.     No current facility-administered medications on file prior to visit.   Allergies  Allergen Reactions   Penicillins Rash and Other (See Comments)    She was told not to take it anymore. Has patient had a PCN reaction causing immediate rash, facial/tongue/throat swelling, SOB or lightheadedness with hypotension: Yes Has patient had a PCN reaction causing severe rash involving mucus membranes or skin necrosis: No Has patient had a PCN reaction that required hospitalization: No Has patient had a PCN reaction occurring within the last 10 years: No If all of the above answers are "NO", then may proceed with Cephalosporin use.'   Shellfish Allergy Anaphylaxis   Sulfonamide Derivatives Anaphylaxis  and Other (See Comments)   Hydrochlorothiazide W-Triamterene Other (See Comments)    Hypokalemia   Lotensin [Benazepril Hcl] Hives   Dipyridamole Other (See Comments)    Unknown reaction   Estrogens Other (See Comments)    Unknown reaction   Hydrochlorothiazide Other (See Comments)    Unknown reaction   Latex Other (See Comments)    Unknown reaction - stated previously during surgery gloves had to be changed, but unsure if it is still an allergy or not.    Metronidazole Other (See Comments)    Unknown reaction   Other Itching    PICKLES   Spironolactone Other (See Comments)    UNSPECIFIED > "kidney problems"   Sulfa Antibiotics Other (See Comments)    Unknown reaction   Torsemide Other (See Comments)    Unknown reaction   Valsartan Other (See Comments)    Unknown reaction   Mustard [Allyl Isothiocyanate] Itching    Recent Results (from the past 2160 hour(s))  Basic metabolic panel     Status: Abnormal   Collection Time: 11/15/20 12:47 PM  Result Value Ref Range   Sodium 139 135 - 145 mmol/L   Potassium 4.6 3.5 - 5.1 mmol/L   Chloride 98 98 - 111 mmol/L   CO2 33 (H) 22 - 32 mmol/L   Glucose, Bld  130 (H) 70 - 99 mg/dL    Comment: Glucose reference range applies only to samples taken after fasting for at least 8 hours.   BUN 21 8 - 23 mg/dL   Creatinine, Ser 1.65 (H) 0.44 - 1.00 mg/dL   Calcium 9.0 8.9 - 10.3 mg/dL   GFR, Estimated 33 (L) >60 mL/min    Comment: (NOTE) Calculated using the CKD-EPI Creatinine Equation (2021)    Anion gap 8 5 - 15    Comment: Performed at [facility] [address] [place], [place] [address]  CBC with Differential     Status: Abnormal   Collection Time: 11/15/20 12:47 PM  Result Value Ref Range   WBC 6.0 4.0 - 10.5 K/uL   RBC 4.01 3.87 - 5.11 MIL/uL   Hemoglobin 12.3 12.0 - 15.0 g/dL   HCT 41.3 36.0 - 46.0 %   MCV 103.0 (H) 80.0 - 100.0 fL   MCH 30.7 26.0 - 34.0 pg   MCHC 29.8 (L) 30.0 - 36.0 g/dL   RDW 17.6 (H) 11.5 - 15.5 %   Platelets 166 150 - 400 K/uL   nRBC 0.8 (H) 0.0 - 0.2 %   Neutrophils Relative % 75 %   Neutro Abs 4.6 1.7 - 7.7 K/uL   Lymphocytes Relative 15 %   Lymphs Abs 0.9 0.7 - 4.0 K/uL   Monocytes Relative 7 %   Monocytes Absolute 0.4 0.1 - 1.0 K/uL   Eosinophils Relative 1 %   Eosinophils Absolute 0.1 0.0 - 0.5 K/uL   Basophils Relative 1 %   Basophils Absolute 0.1 0.0 - 0.1 K/uL   Immature Granulocytes 1 %   Abs Immature Granulocytes 0.04 0.00 - 0.07 K/uL    Comment: Performed at [facility] [address] [place], [place] [address]  Brain natriuretic peptide     Status: Abnormal   Collection Time: 11/15/20 12:47 PM  Result Value Ref Range   B Natriuretic Peptide 129.4 (H) 0.0 - 100.0 pg/mL    Comment: Performed at [facility] [address] [place], [place] [address]  Resp Panel by RT-PCR (Flu A&B, Covid) Nasopharyngeal Swab  Status: None   Collection Time: 11/15/20 12:50 PM   Specimen: Nasopharyngeal Swab; Nasopharyngeal(NP) swabs in vial transport medium  Result Value Ref Range   SARS Coronavirus 2 by RT PCR NEGATIVE NEGATIVE    Comment: (NOTE) SARS-CoV-2 target nucleic acids are  NOT DETECTED.  The SARS-CoV-2 RNA is generally detectable in upper respiratory specimens during the acute phase of infection. The lowest concentration of SARS-CoV-2 viral copies this assay can detect is 138 copies/mL. A negative result does not preclude SARS-Cov-2 infection and should not be used as the sole basis for treatment or other patient management decisions. A negative result may occur with  improper specimen collection/handling, submission of specimen other than nasopharyngeal swab, presence of viral mutation(s) within the areas targeted by this assay, and inadequate number of viral copies(<138 copies/mL). A negative result must be combined with clinical observations, patient history, and epidemiological information. The expected result is Negative.  Fact Sheet for Patients:  EntrepreneurPulse.com.au  Fact Sheet for Healthcare Providers:  IncredibleEmployment.be  This test is no t yet approved or cleared by the [place] FDA and  has been authorized for detection and/or diagnosis of SARS-CoV-2 by FDA under an Emergency Use Authorization (EUA). This EUA will remain  in effect (meaning this test can be used) for the duration of the COVID-19 declaration under Section 564(b)(1) of the Act, 21 U.S.C.section 360bbb-3(b)(1), unless the authorization is terminated  or revoked sooner.       Influenza A by PCR NEGATIVE NEGATIVE   Influenza B by PCR NEGATIVE NEGATIVE    Comment: (NOTE) The Xpert Xpress SARS-CoV-2/FLU/RSV plus assay is intended as an aid in the diagnosis of influenza from Nasopharyngeal swab specimens and should not be used as a sole basis for treatment. Nasal washings and aspirates are unacceptable for Xpert Xpress SARS-CoV-2/FLU/RSV testing.  Fact Sheet for Patients: EntrepreneurPulse.com.au  Fact Sheet for Healthcare Providers: IncredibleEmployment.be  This test is not yet approved  or cleared by the [place] FDA and has been authorized for detection and/or diagnosis of SARS-CoV-2 by FDA under an Emergency Use Authorization (EUA). This EUA will remain in effect (meaning this test can be used) for the duration of the COVID-19 declaration under Section 564(b)(1) of the Act, 21 U.S.C. section 360bbb-3(b)(1), unless the authorization is terminated or revoked.  Performed at [facility], [place] [address] [place], [place] [address]   TSH     Status: None   Collection Time: 11/15/20  3:43 PM  Result Value Ref Range   TSH 4.317 0.350 - 4.500 uIU/mL    Comment: Performed by a 3rd Generation assay with a functional sensitivity of <=0.01 uIU/mL. Performed at [facility], [place] [address], [place], [place] [address]   Ammonia     Status: None   Collection Time: 11/15/20  3:43 PM  Result Value Ref Range   Ammonia <10 9 - 35 umol/L    Comment: Performed at [facility], [place] [address] [place], [place] [address]  Troponin I (High Sensitivity)     Status: None   Collection Time: 11/15/20  3:43 PM  Result Value Ref Range   Troponin I (High Sensitivity) 9 <18 ng/L    Comment: (NOTE) Elevated high sensitivity troponin I (hsTnI) values and significant  changes across serial measurements may suggest ACS but many other  chronic and acute conditions are known to elevate hsTnI results.  Refer to the "Links" section for chest pain algorithms and additional  guidance. Performed at [facility]  [facility], [place] [address], [place], [place] [address]   Troponin I (High Sensitivity)     Status: None   Collection Time: 11/15/20  6:01 PM  Result Value Ref Range   Troponin I (High Sensitivity) 7 <18 ng/L    Comment: (NOTE) Elevated high sensitivity troponin I (hsTnI) values and significant  changes across serial measurements may suggest ACS but many other  chronic and acute conditions are known to elevate hsTnI results.  Refer to the "Links" section for chest pain  algorithms and additional  guidance. Performed at [facility], [place] [address] [place], [place] [address]   I-Stat arterial blood gas, [facility] ED)     Status: Abnormal   Collection Time: 11/15/20  7:00 PM  Result Value Ref Range   pH, Arterial 7.290 (L) 7.350 - 7.450   pCO2 arterial 79.9 (HH) 32.0 - 48.0 mmHg   pO2, Arterial 69 (L) 83.0 - 108.0 mmHg   Bicarbonate 38.4 (H) 20.0 - 28.0 mmol/L   TCO2 41 (H) 22 - 32 mmol/L   O2 Saturation 90.0 %   Acid-Base Excess 9.0 (H) 0.0 - 2.0 mmol/L   Sodium 141 135 - 145 mmol/L   Potassium 4.2 3.5 - 5.1 mmol/L   Calcium, Ion 1.22 1.15 - 1.40 mmol/L   HCT 40.0 36.0 - 46.0 %   Hemoglobin 13.6 12.0 - 15.0 g/dL   Sample type ARTERIAL    Comment NOTIFIED PHYSICIAN   HIV Antibody (routine testing w rflx)     Status: None   Collection Time: 11/15/20  9:33 PM  Result Value Ref Range   HIV Screen 4th Generation wRfx Non Reactive Non Reactive    Comment: Performed at [facility], [place] [address], [place], [place] [address]  Basic metabolic panel     Status: Abnormal   Collection Time: 11/16/20  3:44 AM  Result Value Ref Range   Sodium 140 135 - 145 mmol/L   Potassium 4.3 3.5 - 5.1 mmol/L   Chloride 97 (L) 98 - 111 mmol/L   CO2 35 (H) 22 - 32 mmol/L   Glucose, Bld 105 (H) 70 - 99 mg/dL    Comment: Glucose reference range applies only to samples taken after fasting for at least 8 hours.   BUN 17 8 - 23 mg/dL   Creatinine, Ser 1.38 (H) 0.44 - 1.00 mg/dL   Calcium 8.6 (L) 8.9 - 10.3 mg/dL   GFR, Estimated 41 (L) >60 mL/min    Comment: (NOTE) Calculated using the CKD-EPI Creatinine Equation (2021)    Anion gap 8 5 - 15    Comment: Performed at [facility] [address] [place], [place] [address]  CBG monitoring, ED     Status: Abnormal   Collection Time: 11/16/20  8:38 AM  Result Value Ref Range   Glucose-Capillary 137 (H) 70 - 99 mg/dL    Comment: Glucose reference range applies only to samples taken after fasting for at least  8 hours.  Glucose, capillary     Status: Abnormal   Collection Time: 11/16/20 11:25 AM  Result Value Ref Range   Glucose-Capillary 122 (H) 70 - 99 mg/dL    Comment: Glucose reference range applies only to samples taken after fasting for at least 8 hours.  Glucose, capillary     Status: Abnormal   Collection Time: 11/16/20  3:35 PM  Result Value Ref Range   Glucose-Capillary 109 (H) 70 - 99 mg/dL    Comment:  Glucose reference range applies only to samples taken after fasting for at least 8 hours.  Glucose, capillary     Status: Abnormal   Collection Time: 11/16/20  9:14 PM  Result Value Ref Range   Glucose-Capillary 100 (H) 70 - 99 mg/dL    Comment: Glucose reference range applies only to samples taken after fasting for at least 8 hours.  Renal function panel     Status: Abnormal   Collection Time: 11/17/20  1:21 AM  Result Value Ref Range   Sodium 136 135 - 145 mmol/L   Potassium 4.7 3.5 - 5.1 mmol/L   Chloride 96 (L) 98 - 111 mmol/L   CO2 31 22 - 32 mmol/L   Glucose, Bld 119 (H) 70 - 99 mg/dL    Comment: Glucose reference range applies only to samples taken after fasting for at least 8 hours.   BUN 21 8 - 23 mg/dL   Creatinine, Ser 1.40 (H) 0.44 - 1.00 mg/dL   Calcium 8.2 (L) 8.9 - 10.3 mg/dL   Phosphorus 4.5 2.5 - 4.6 mg/dL   Albumin 2.8 (L) 3.5 - 5.0 g/dL   GFR, Estimated 40 (L) >60 mL/min    Comment: (NOTE) Calculated using the CKD-EPI Creatinine Equation (2021)    Anion gap 9 5 - 15    Comment: Performed at [facility] [address] [place],  [address]  Magnesium     Status: None   Collection Time: 11/17/20  1:21 AM  Result Value Ref Range   Magnesium 2.2 1.7 - 2.4 mg/dL    Comment: Performed at [facility] [address], [place], [place] [address]  CBC     Status: Abnormal   Collection Time: 11/17/20  1:21 AM  Result Value Ref Range   WBC 5.9 4.0 - 10.5 K/uL   RBC 3.57 (L) 3.87 - 5.11 MIL/uL   Hemoglobin 10.9 (L) 12.0 - 15.0 g/dL   HCT  37.5 36.0 - 46.0 %   MCV 105.0 (H) 80.0 - 100.0 fL   MCH 30.5 26.0 - 34.0 pg   MCHC 29.1 (L) 30.0 - 36.0 g/dL   RDW 17.1 (H) 11.5 - 15.5 %   Platelets 143 (L) 150 - 400 K/uL   nRBC 0.7 (H) 0.0 - 0.2 %    Comment: Performed at [facility] [address], [place], [place] [address]  Glucose, capillary     Status: Abnormal   Collection Time: 11/17/20  6:53 AM  Result Value Ref Range   Glucose-Capillary 125 (H) 70 - 99 mg/dL    Comment: Glucose reference range applies only to samples taken after fasting for at least 8 hours.  Glucose, capillary     Status: Abnormal   Collection Time: 11/17/20 11:27 AM  Result Value Ref Range   Glucose-Capillary 176 (H) 70 - 99 mg/dL    Comment: Glucose reference range applies only to samples taken after fasting for at least 8 hours.  Glucose, capillary     Status: None   Collection Time: 11/17/20  3:24 PM  Result Value Ref Range   Glucose-Capillary 88 70 - 99 mg/dL    Comment: Glucose reference range applies only to samples taken after fasting for at least 8 hours.  Glucose, capillary     Status: Abnormal   Collection Time: 11/17/20 10:07 PM  Result Value Ref Range   Glucose-Capillary 102 (H) 70 - 99 mg/dL    Comment: Glucose reference range applies only to  samples taken after fasting for at least 8 hours.  Magnesium     Status: None   Collection Time: 11/18/20 12:53 AM  Result Value Ref Range   Magnesium 2.3 1.7 - 2.4 mg/dL    Comment: Performed at [facility] [address] [place], [place] [address]  CBC     Status: Abnormal   Collection Time: 11/18/20 12:53 AM  Result Value Ref Range   WBC 5.6 4.0 - 10.5 K/uL   RBC 3.35 (L) 3.87 - 5.11 MIL/uL   Hemoglobin 10.2 (L) 12.0 - 15.0 g/dL   HCT 35.1 (L) 36.0 - 46.0 %   MCV 104.8 (H) 80.0 - 100.0 fL   MCH 30.4 26.0 - 34.0 pg   MCHC 29.1 (L) 30.0 - 36.0 g/dL   RDW 16.9 (H) 11.5 - 15.5 %   Platelets 142 (L) 150 - 400 K/uL   nRBC 0.7 (H) 0.0 - 0.2 %    Comment: Performed at [facility], [place] [address], [place], [place] [address]  Brain natriuretic peptide     Status: Abnormal   Collection Time: 11/18/20 12:53 AM  Result Value Ref Range   B Natriuretic Peptide 110.8 (H) 0.0 - 100.0 pg/mL    Comment: Performed at [name] [address] [place], [place] [address]  Renal function panel     Status: Abnormal   Collection Time: 11/18/20 12:53 AM  Result Value Ref Range   Sodium 138 135 - 145 mmol/L   Potassium 4.0 3.5 - 5.1 mmol/L   Chloride 97 (L) 98 - 111 mmol/L   CO2 34 (H) 22 - 32 mmol/L   Glucose, Bld 128 (H) 70 - 99 mg/dL    Comment: Glucose reference range applies only to samples taken after fasting for at least 8 hours.   BUN 21 8 - 23 mg/dL   Creatinine, Ser 1.39 (H) 0.44 - 1.00 mg/dL   Calcium 8.3 (L) 8.9 - 10.3 mg/dL   Phosphorus 3.6 2.5 - 4.6 mg/dL   Albumin 2.7 (L) 3.5 - 5.0 g/dL   GFR, Estimated 41 (L) >60 mL/min    Comment: (NOTE) Calculated using the CKD-EPI Creatinine Equation (2021)    Anion gap 7 5 - 15    Comment: Performed at [facility] [address], [place], [place] [address]  Glucose, capillary     Status: Abnormal   Collection Time: 11/18/20  6:43 AM  Result Value Ref Range   Glucose-Capillary 122 (H) 70 - 99 mg/dL    Comment: Glucose reference range applies only to samples taken after fasting for at least 8 hours.  Glucose, capillary     Status: Abnormal   Collection Time: 11/18/20 11:06 AM  Result Value Ref Range   Glucose-Capillary 136 (H) 70 - 99 mg/dL    Comment: Glucose reference range applies only to samples taken after fasting for at least 8 hours.   Comment 1 Notify RN    Comment 2 Document in Chart   ECHOCARDIOGRAM COMPLETE     Status: None   Collection Time: 11/18/20  3:41 PM  Result Value Ref Range   Weight 5,851.89 oz   Height 64 in   BP 130/66 mmHg   S' Lateral 2.50 cm   AR max vel 2.10 cm2   AV Area VTI 2.11 cm2   AV Mean grad 5.0 mmHg   AV Peak grad 9.1 mmHg   Ao pk vel 1.51 m/s  Area-P 1/2 2.80 cm2   AV Area mean vel 2.05 cm2  Glucose, capillary     Status: None   Collection Time: 11/18/20  4:31 PM  Result Value Ref Range   Glucose-Capillary 89 70 - 99 mg/dL    Comment: Glucose reference range applies only to samples taken after fasting for at least 8 hours.   Comment 1 Notify RN    Comment 2 Document in Chart   Glucose, capillary     Status: Abnormal   Collection Time: 11/18/20  9:08 PM  Result Value Ref Range   Glucose-Capillary 104 (H) 70 - 99 mg/dL    Comment: Glucose reference range applies only to samples taken after fasting for at least 8 hours.  Renal function panel     Status: Abnormal   Collection Time: 11/19/20  1:41 AM  Result Value Ref Range   Sodium 135 135 - 145 mmol/L   Potassium 3.7 3.5 - 5.1 mmol/L   Chloride 95 (L) 98 - 111 mmol/L   CO2 33 (H) 22 - 32 mmol/L   Glucose, Bld 145 (H) 70 - 99 mg/dL    Comment: Glucose reference range applies only to samples taken after fasting for at least 8 hours.   BUN 20 8 - 23 mg/dL   Creatinine, Ser 1.35 (H) 0.44 - 1.00 mg/dL   Calcium 8.1 (L) 8.9 - 10.3 mg/dL   Phosphorus 3.9 2.5 - 4.6 mg/dL   Albumin 2.7 (L) 3.5 - 5.0 g/dL   GFR, Estimated 42 (L) >60 mL/min    Comment: (NOTE) Calculated using the CKD-EPI Creatinine Equation (2021)    Anion gap 7 5 - 15    Comment: Performed at [facility] [address], [place], [place] [address]  Magnesium     Status: None   Collection Time: 11/19/20  1:41 AM  Result Value Ref Range   Magnesium 2.3 1.7 - 2.4 mg/dL    Comment: Performed at [facility] [address], [place], [place] [address]  CBC     Status: Abnormal   Collection Time: 11/19/20  1:41 AM  Result Value Ref Range   WBC 5.4 4.0 - 10.5 K/uL   RBC 3.35 (L) 3.87 - 5.11 MIL/uL   Hemoglobin 10.4 (L) 12.0 - 15.0 g/dL   HCT 35.1 (L) 36.0 - 46.0 %   MCV 104.8 (H) 80.0 - 100.0 fL   MCH 31.0 26.0 - 34.0 pg   MCHC 29.6 (L) 30.0 - 36.0 g/dL   RDW 16.4 (H) 11.5 - 15.5 %   Platelets  146 (L) 150 - 400 K/uL   nRBC 0.4 (H) 0.0 - 0.2 %    Comment: Performed at [facility], [place] [address], [place], [place] [address]  Vitamin B12     Status: Abnormal   Collection Time: 11/19/20  1:41 AM  Result Value Ref Range   Vitamin B-12 1,681 (H) 180 - 914 pg/mL    Comment: (NOTE) This assay is not validated for testing neonatal or myeloproliferative syndrome specimens for Vitamin B12 levels. Performed at [facility], [place] [address] [place], [place] [address]   Folate     Status: None   Collection Time: 11/19/20  1:41 AM  Result Value Ref Range   Folate 38.7 >5.9 ng/mL    Comment: Performed at [facility] [address], [place], [place] [address]  Iron and TIBC     Status: Abnormal   Collection Time:  11/19/20  1:41 AM  Result Value Ref Range   Iron 51 28 - 170 ug/dL   TIBC 248 (L) 250 - 450 ug/dL   Saturation Ratios 21 10.4 - 31.8 %   UIBC 197 ug/dL    Comment: Performed at [facility] [address], [place], [place] [address]  Ferritin     Status: None   Collection Time: 11/19/20  1:41 AM  Result Value Ref Range   Ferritin 79 11 - 307 ng/mL    Comment: Performed at [facility], [place] [address], [place], [place] [address]  Reticulocytes     Status: Abnormal   Collection Time: 11/19/20  1:41 AM  Result Value Ref Range   Retic Ct Pct 2.6 0.4 - 3.1 %   RBC. 3.41 (L) 3.87 - 5.11 MIL/uL   Retic Count, Absolute 88.3 19.0 - 186.0 K/uL   Immature Retic Fract 26.3 (H) 2.3 - 15.9 %    Comment: Performed at [facility] [address], [place], [place] [address]  Glucose, capillary     Status: Abnormal   Collection Time: 11/19/20  6:08 AM  Result Value Ref Range   Glucose-Capillary 130 (H) 70 - 99 mg/dL    Comment: Glucose reference range applies only to samples taken after fasting for at least 8 hours.   Comment 1 Notify RN    Comment 2 Document in Chart   Glucose, capillary     Status: Abnormal   Collection Time: 11/19/20 11:58  AM  Result Value Ref Range   Glucose-Capillary 142 (H) 70 - 99 mg/dL    Comment: Glucose reference range applies only to samples taken after fasting for at least 8 hours.  POCT I-Stat EG7     Status: Abnormal   Collection Time: 11/19/20  2:36 PM  Result Value Ref Range   pH, Ven 7.274 7.250 - 7.430   pCO2, Ven 85.3 (HH) 44.0 - 60.0 mmHg   pO2, Ven 38.0 32.0 - 45.0 mmHg   Bicarbonate 39.5 (H) 20.0 - 28.0 mmol/L   TCO2 42 (H) 22 - 32 mmol/L   O2 Saturation 61.0 %   Acid-Base Excess 9.0 (H) 0.0 - 2.0 mmol/L   Sodium 142 135 - 145 mmol/L   Potassium 3.6 3.5 - 5.1 mmol/L   Calcium, Ion 1.12 (L) 1.15 - 1.40 mmol/L   HCT 38.0 36.0 - 46.0 %   Hemoglobin 12.9 12.0 - 15.0 g/dL   Sample type VENOUS    Comment NOTIFIED PHYSICIAN   POCT I-Stat EG7     Status: Abnormal   Collection Time: 11/19/20  2:36 PM  Result Value Ref Range   pH, Ven 7.283 7.250 - 7.430   pCO2, Ven 85.7 (HH) 44.0 - 60.0 mmHg   pO2, Ven 38.0 32.0 - 45.0 mmHg   Bicarbonate 40.5 (H) 20.0 - 28.0 mmol/L   TCO2 43 (H) 22 - 32 mmol/L   O2 Saturation 62.0 %   Acid-Base Excess 10.0 (H) 0.0 - 2.0 mmol/L   Sodium 140 135 - 145 mmol/L   Potassium 3.8 3.5 - 5.1 mmol/L   Calcium, Ion 1.20 1.15 - 1.40 mmol/L   HCT 39.0 36.0 - 46.0 %   Hemoglobin 13.3 12.0 - 15.0 g/dL   Sample type VENOUS    Comment NOTIFIED PHYSICIAN   POCT I-Stat EG7     Status: Abnormal   Collection Time: 11/19/20  2:41 PM  Result Value Ref Range   pH, Ven 7.285 7.250 -  7.430   pCO2, Ven 81.4 (HH) 44.0 - 60.0 mmHg   pO2, Ven 43.0 32.0 - 45.0 mmHg   Bicarbonate 38.7 (H) 20.0 - 28.0 mmol/L   TCO2 41 (H) 22 - 32 mmol/L   O2 Saturation 69.0 %   Acid-Base Excess 9.0 (H) 0.0 - 2.0 mmol/L   Sodium 143 135 - 145 mmol/L   Potassium 3.5 3.5 - 5.1 mmol/L   Calcium, Ion 1.07 (L) 1.15 - 1.40 mmol/L   HCT 38.0 36.0 - 46.0 %   Hemoglobin 12.9 12.0 - 15.0 g/dL   Sample type VENOUS    Comment NOTIFIED PHYSICIAN   Glucose, capillary     Status: None   Collection  Time: 11/19/20  4:32 PM  Result Value Ref Range   Glucose-Capillary 95 70 - 99 mg/dL    Comment: Glucose reference range applies only to samples taken after fasting for at least 8 hours.  Glucose, capillary     Status: Abnormal   Collection Time: 11/19/20  9:03 PM  Result Value Ref Range   Glucose-Capillary 112 (H) 70 - 99 mg/dL    Comment: Glucose reference range applies only to samples taken after fasting for at least 8 hours.  Basic metabolic panel     Status: Abnormal   Collection Time: 11/20/20  2:40 AM  Result Value Ref Range   Sodium 131 (L) 135 - 145 mmol/L    Comment: DELTA CHECK NOTED   Potassium 3.4 (L) 3.5 - 5.1 mmol/L   Chloride 88 (L) 98 - 111 mmol/L   CO2 36 (H) 22 - 32 mmol/L   Glucose, Bld 124 (H) 70 - 99 mg/dL    Comment: Glucose reference range applies only to samples taken after fasting for at least 8 hours.   BUN 19 8 - 23 mg/dL   Creatinine, Ser 1.38 (H) 0.44 - 1.00 mg/dL   Calcium 8.5 (L) 8.9 - 10.3 mg/dL   GFR, Estimated 41 (L) >60 mL/min    Comment: (NOTE) Calculated using the CKD-EPI Creatinine Equation (2021)    Anion gap 7 5 - 15    Comment: Performed at [facility] [address], [place], [place] [address]  Magnesium     Status: None   Collection Time: 11/20/20  2:40 AM  Result Value Ref Range   Magnesium 2.4 1.7 - 2.4 mg/dL    Comment: Performed at [facility] [address] [place], [place] [address]  CBC     Status: Abnormal   Collection Time: 11/20/20  2:40 AM  Result Value Ref Range   WBC 5.5 4.0 - 10.5 K/uL   RBC 4.00 3.87 - 5.11 MIL/uL   Hemoglobin 12.4 12.0 - 15.0 g/dL   HCT 41.5 36.0 - 46.0 %   MCV 103.8 (H) 80.0 - 100.0 fL   MCH 31.0 26.0 - 34.0 pg   MCHC 29.9 (L) 30.0 - 36.0 g/dL   RDW 15.9 (H) 11.5 - 15.5 %   Platelets 156 150 - 400 K/uL   nRBC 0.0 0.0 - 0.2 %    Comment: Performed at [facility], [place] [address], [place], [place] [address]  Glucose, capillary     Status: Abnormal   Collection Time:  11/20/20  3:02 AM  Result Value Ref Range   Glucose-Capillary 114 (H) 70 - 99 mg/dL    Comment: Glucose reference range applies only to samples taken after fasting for at least 8 hours.  Blood gas, arterial  Status: Abnormal   Collection Time: 11/20/20  3:08 AM  Result Value Ref Range   FIO2 35.00    pH, Arterial 7.286 (L) 7.350 - 7.450   pCO2 arterial 85.8 (HH) 32.0 - 48.0 mmHg    Comment: CRITICAL RESULT CALLED TO, READ BACK BY AND VERIFIED WITH: BECKY PETERS, RN 11/20/2020 0324 [address]TA[address]OR    pO2, Arterial 84.7 83.0 - 108.0 mmHg   Bicarbonate 39.0 (H) 20.0 - 28.0 mmol/L   Acid-Base Excess 12.3 (H) 0.0 - 2.0 mmol/L   O2 Saturation 95.3 %   Patient temperature 38.0    Collection site RIGHT RADIAL    Drawn by [phone]    Sample type ARTERIAL    Allens test (pass/fail) PASS PASS    Comment: Performed at [facility], [place] [address] [place], [place] [address]  Culture, blood (routine x 2)     Status: None   Collection Time: 11/20/20  4:38 AM   Specimen: BLOOD  Result Value Ref Range   Specimen Description BLOOD RIGHT ANTECUBITAL    Special Requests AEROBIC BOTTLE ONLY Blood Culture adequate volume    Culture      NO GROWTH 5 DAYS Performed at [facility], [place] [address], [place], [place] [address]    Report Status 11/25/2020 FINAL   Culture, blood (routine x 2)     Status: None   Collection Time: 11/20/20  4:38 AM   Specimen: BLOOD RIGHT HAND  Result Value Ref Range   Specimen Description BLOOD RIGHT HAND    Special Requests AEROBIC BOTTLE ONLY Blood Culture adequate volume    Culture      NO GROWTH 5 DAYS Performed at [facility], [place] [address], [place], [place] [address]    Report Status 11/25/2020 FINAL   Procalcitonin - Baseline     Status: None   Collection Time: 11/20/20  4:38 AM  Result Value Ref Range   Procalcitonin <0.10 ng/mL    Comment:        Interpretation: PCT (Procalcitonin) <= 0.5 ng/mL: Systemic infection (sepsis) is not  likely. Local bacterial infection is possible. (NOTE)       Sepsis PCT Algorithm           Lower Respiratory Tract                                      Infection PCT Algorithm    ----------------------------     ----------------------------         PCT < 0.25 ng/mL                PCT < 0.10 ng/mL          Strongly encourage             Strongly discourage   discontinuation of antibiotics    initiation of antibiotics    ----------------------------     -----------------------------       PCT 0.25 - 0.50 ng/mL            PCT 0.10 - 0.25 ng/mL               OR       >80% decrease in PCT            Discourage initiation of  antibiotics      Encourage discontinuation           of antibiotics    ----------------------------     -----------------------------         PCT >= 0.50 ng/mL              PCT 0.26 - 0.50 ng/mL               AND        <80% decrease in PCT             Encourage initiation of                                             antibiotics       Encourage continuation           of antibiotics    ----------------------------     -----------------------------        PCT >= 0.50 ng/mL                  PCT > 0.50 ng/mL               AND         increase in PCT                  Strongly encourage                                      initiation of antibiotics    Strongly encourage escalation           of antibiotics                                     -----------------------------                                           PCT <= 0.25 ng/mL                                                 OR                                        > 80% decrease in PCT                                      Discontinue / Do not initiate                                             antibiotics  Performed at [facility], [place] [address] [place], [place] [address]   Urinalysis, Complete w Microscopic     Status: Abnormal  Collection Time: 11/20/20   4:55 AM  Result Value Ref Range   Color, Urine YELLOW YELLOW   APPearance CLEAR CLEAR   Specific Gravity, Urine 1.006 1.005 - 1.030   pH 8.0 5.0 - 8.0   Glucose, UA NEGATIVE NEGATIVE mg/dL   Hgb urine dipstick NEGATIVE NEGATIVE   Bilirubin Urine NEGATIVE NEGATIVE   Ketones, ur NEGATIVE NEGATIVE mg/dL   Protein, ur NEGATIVE NEGATIVE mg/dL   Nitrite POSITIVE (A) NEGATIVE   Leukocytes,Ua NEGATIVE NEGATIVE   WBC, UA 0-5 0 - 5 WBC/hpf   Bacteria, UA MANY (A) NONE SEEN   Squamous Epithelial / LPF 0-5 0 - 5    Comment: Performed at [facility] [address] [place], [place] [address]  Urine Culture     Status: Abnormal   Collection Time: 11/20/20  4:55 AM   Specimen: Urine, Clean Catch  Result Value Ref Range   Specimen Description URINE, CLEAN CATCH    Special Requests      NONE Performed at [facility], [place] [address] [place], [place] [address]    Culture MULTIPLE SPECIES PRESENT, SUGGEST RECOLLECTION (A)    Report Status 11/21/2020 FINAL   Glucose, capillary     Status: Abnormal   Collection Time: 11/20/20  6:18 AM  Result Value Ref Range   Glucose-Capillary 113 (H) 70 - 99 mg/dL    Comment: Glucose reference range applies only to samples taken after fasting for at least 8 hours.  Glucose, capillary     Status: Abnormal   Collection Time: 11/20/20 11:35 AM  Result Value Ref Range   Glucose-Capillary 105 (H) 70 - 99 mg/dL    Comment: Glucose reference range applies only to samples taken after fasting for at least 8 hours.  Glucose, capillary     Status: Abnormal   Collection Time: 11/20/20  3:52 PM  Result Value Ref Range   Glucose-Capillary 112 (H) 70 - 99 mg/dL    Comment: Glucose reference range applies only to samples taken after fasting for at least 8 hours.  Glucose, capillary     Status: Abnormal   Collection Time: 11/20/20  9:09 PM  Result Value Ref Range   Glucose-Capillary 128 (H) 70 - 99 mg/dL    Comment: Glucose reference range applies only to  samples taken after fasting for at least 8 hours.  Basic metabolic panel     Status: Abnormal   Collection Time: 11/21/20  1:44 AM  Result Value Ref Range   Sodium 137 135 - 145 mmol/L   Potassium 3.9 3.5 - 5.1 mmol/L   Chloride 94 (L) 98 - 111 mmol/L   CO2 34 (H) 22 - 32 mmol/L   Glucose, Bld 121 (H) 70 - 99 mg/dL    Comment: Glucose reference range applies only to samples taken after fasting for at least 8 hours.   BUN 25 (H) 8 - 23 mg/dL   Creatinine, Ser 1.55 (H) 0.44 - 1.00 mg/dL   Calcium 8.6 (L) 8.9 - 10.3 mg/dL   GFR, Estimated 36 (L) >60 mL/min    Comment: (NOTE) Calculated using the CKD-EPI Creatinine Equation (2021)    Anion gap 9 5 - 15    Comment: Performed at [facility] [address] [place], [place] [address]  Glucose, capillary     Status: Abnormal   Collection Time: 11/21/20  6:04 AM  Result Value Ref Range   Glucose-Capillary 113 (H) 70 - 99 mg/dL    Comment:  Glucose reference range applies only to samples taken after fasting for at least 8 hours.  Glucose, capillary     Status: Abnormal   Collection Time: 11/21/20 11:28 AM  Result Value Ref Range   Glucose-Capillary 167 (H) 70 - 99 mg/dL    Comment: Glucose reference range applies only to samples taken after fasting for at least 8 hours.  Glucose, capillary     Status: Abnormal   Collection Time: 11/21/20  4:03 PM  Result Value Ref Range   Glucose-Capillary 102 (H) 70 - 99 mg/dL    Comment: Glucose reference range applies only to samples taken after fasting for at least 8 hours.  Glucose, capillary     Status: Abnormal   Collection Time: 11/21/20  9:40 PM  Result Value Ref Range   Glucose-Capillary 104 (H) 70 - 99 mg/dL    Comment: Glucose reference range applies only to samples taken after fasting for at least 8 hours.  Basic metabolic panel     Status: Abnormal   Collection Time: 11/22/20  2:51 AM  Result Value Ref Range   Sodium 137 135 - 145 mmol/L   Potassium 3.2 (L) 3.5 - 5.1 mmol/L    Chloride 92 (L) 98 - 111 mmol/L   CO2 36 (H) 22 - 32 mmol/L   Glucose, Bld 161 (H) 70 - 99 mg/dL    Comment: Glucose reference range applies only to samples taken after fasting for at least 8 hours.   BUN 27 (H) 8 - 23 mg/dL   Creatinine, Ser 1.48 (H) 0.44 - 1.00 mg/dL   Calcium 8.6 (L) 8.9 - 10.3 mg/dL   GFR, Estimated 38 (L) >60 mL/min    Comment: (NOTE) Calculated using the CKD-EPI Creatinine Equation (2021)    Anion gap 9 5 - 15    Comment: Performed at [facility] [address] [place], [place] [address]  Glucose, capillary     Status: Abnormal   Collection Time: 11/22/20  6:19 AM  Result Value Ref Range   Glucose-Capillary 115 (H) 70 - 99 mg/dL    Comment: Glucose reference range applies only to samples taken after fasting for at least 8 hours.  Glucose, capillary     Status: Abnormal   Collection Time: 11/22/20 11:32 AM  Result Value Ref Range   Glucose-Capillary 145 (H) 70 - 99 mg/dL    Comment: Glucose reference range applies only to samples taken after fasting for at least 8 hours.  Hemoglobin A1c     Status: None   Collection Time: 11/29/20 11:39 AM  Result Value Ref Range   Hgb A1c MFr Bld 6.4 4.6 - 6.5 %    Comment: Glycemic Control Guidelines for People with Diabetes:Non Diabetic:  <6%Goal of Therapy: <7%Additional Action Suggested:  >8%   Lipid panel     Status: Abnormal   Collection Time: 11/29/20 11:39 AM  Result Value Ref Range   Cholesterol 166 0 - 200 mg/dL    Comment: ATP III Classification       Desirable:  < 200 mg/dL               Borderline High:  200 - 239 mg/dL          High:  > = 240 mg/dL   Triglycerides 102.0 0.0 - 149.0 mg/dL    Comment: Normal:  <150 mg/dLBorderline High:  150 - 199 mg/dL   HDL 33.80 (L) >39.00 mg/dL   VLDL 20.4 0.0 - 40.0 mg/dL  LDL Cholesterol 111 (H) 0 - 99 mg/dL   Total CHOL/HDL Ratio 5     Comment:                Men          Women1/2 Average Risk     3.4          3.3Average Risk          5.0          4.42X  Average Risk          9.6          7.13X Average Risk          15.0          11.0                       NonHDL 131.81     Comment: NOTE:  Non-HDL goal should be 30 mg/dL higher than patient's LDL goal (i.e. LDL goal of < 70 mg/dL, would have non-HDL goal of < 100 mg/dL)  CBC with Differential/Platelet     Status: Abnormal   Collection Time: 11/29/20 11:39 AM  Result Value Ref Range   WBC 4.5 4.0 - 10.5 K/uL   RBC 3.90 3.87 - 5.11 Mil/uL   Hemoglobin 12.0 12.0 - 15.0 g/dL   HCT 38.5 36.0 - 46.0 %   MCV 98.7 78.0 - 100.0 fl   MCHC 31.0 30.0 - 36.0 g/dL   RDW 16.1 (H) 11.5 - 15.5 %   Platelets 153.0 150.0 - 400.0 K/uL   Neutrophils Relative % 69.0 43.0 - 77.0 %   Lymphocytes Relative 18.8 12.0 - 46.0 %   Monocytes Relative 8.5 3.0 - 12.0 %   Eosinophils Relative 1.6 0.0 - 5.0 %   Basophils Relative 2.1 0.0 - 3.0 %   Neutro Abs 3.1 1.4 - 7.7 K/uL   Lymphs Abs 0.8 0.7 - 4.0 K/uL   Monocytes Absolute 0.4 0.1 - 1.0 K/uL   Eosinophils Absolute 0.1 0.0 - 0.7 K/uL   Basophils Absolute 0.1 0.0 - 0.1 K/uL  Comprehensive metabolic panel     Status: Abnormal   Collection Time: 11/29/20 11:39 AM  Result Value Ref Range   Sodium 140 135 - 145 mEq/L   Potassium 3.8 3.5 - 5.1 mEq/L   Chloride 100 96 - 112 mEq/L   CO2 33 (H) 19 - 32 mEq/L   Glucose, Bld 122 (H) 70 - 99 mg/dL   BUN 43 (H) 6 - 23 mg/dL   Creatinine, Ser 1.38 (H) 0.40 - 1.20 mg/dL   Total Bilirubin 0.5 0.2 - 1.2 mg/dL   Alkaline Phosphatase 99 39 - 117 U/L   AST 23 0 - 37 U/L   ALT 21 0 - 35 U/L   Total Protein 7.4 6.0 - 8.3 g/dL   Albumin 4.0 3.5 - 5.2 g/dL   GFR 38.79 (L) >60.00 mL/min    Comment: Calculated using the CKD-EPI Creatinine Equation (2021)   Calcium 9.8 8.4 - 10.5 mg/dL  Urine Culture     Status: None   Collection Time: 11/29/20 11:39 AM   Specimen: Blood  Result Value Ref Range   Source: NOT GIVEN    Status: FINAL    Result:      Mixed genital flora isolated. These superficial bacteria are not indicative  of a urinary tract infection. No further organism identification is warranted on this specimen. If clinically indicated, recollect clean-catch, mid-stream urine  and transfer  immediately to Urine Culture Transport Tube.   Urinalysis, Routine w reflex microscopic     Status: Abnormal   Collection Time: 11/29/20 11:39 AM  Result Value Ref Range   Color, Urine YELLOW Yellow;Lt. Yellow;Straw;Dark Yellow;Amber;Green;Red;Brown   APPearance CLEAR Clear;Turbid;Slightly Cloudy;Cloudy   Specific Gravity, Urine <=1.005 (A) 1.000 - 1.030   pH 7.0 5.0 - 8.0   Total Protein, Urine NEGATIVE Negative   Urine Glucose NEGATIVE Negative   Ketones, ur NEGATIVE Negative   Bilirubin Urine NEGATIVE Negative   Hgb urine dipstick NEGATIVE Negative   Urobilinogen, UA 0.2 0.0 - 1.0   Leukocytes,Ua SMALL (A) Negative   Nitrite NEGATIVE Negative   WBC, UA 0-2/hpf 0-2/hpf   RBC / HPF 0-2/hpf 0-2/hpf   Squamous Epithelial / LPF Rare(0-4/hpf) EOFH(2-1/FXJ)  Basic Metabolic Panel (BMET)     Status: Abnormal   Collection Time: 12/04/20 11:43 AM  Result Value Ref Range   Sodium 138 135 - 145 mmol/L   Potassium 3.7 3.5 - 5.1 mmol/L   Chloride 99 98 - 111 mmol/L   CO2 31 22 - 32 mmol/L   Glucose, Bld 152 (H) 70 - 99 mg/dL    Comment: Glucose reference range applies only to samples taken after fasting for at least 8 hours.   BUN 34 (H) 8 - 23 mg/dL   Creatinine, Ser 1.62 (H) 0.44 - 1.00 mg/dL   Calcium 9.3 8.9 - 10.3 mg/dL   GFR, Estimated 34 (L) >60 mL/min    Comment: (NOTE) Calculated using the CKD-EPI Creatinine Equation (2021)    Anion gap 8 5 - 15    Comment: Performed at [facility] [address], [place], [place] [address]  CBC with Differential ([facility] Only)     Status: Abnormal   Collection Time: 12/10/20 10:15 AM  Result Value Ref Range   WBC Count 5.7 4.0 - 10.5 K/uL   RBC 3.78 (L) 3.87 - 5.11 MIL/uL   Hemoglobin 11.4 (L) 12.0 - 15.0 g/dL   HCT 37.3 36.0 - 46.0 %   MCV 98.7 80.0 -  100.0 fL   MCH 30.2 26.0 - 34.0 pg   MCHC 30.6 30.0 - 36.0 g/dL   RDW 15.3 11.5 - 15.5 %   Platelet Count 111 (L) 150 - 400 K/uL   nRBC 0.0 0.0 - 0.2 %   Neutrophils Relative % 67 %   Neutro Abs 3.8 1.7 - 7.7 K/uL   Lymphocytes Relative 22 %   Lymphs Abs 1.2 0.7 - 4.0 K/uL   Monocytes Relative 8 %   Monocytes Absolute 0.5 0.1 - 1.0 K/uL   Eosinophils Relative 2 %   Eosinophils Absolute 0.1 0.0 - 0.5 K/uL   Basophils Relative 1 %   Basophils Absolute 0.1 0.0 - 0.1 K/uL   Immature Granulocytes 0 %   Abs Immature Granulocytes 0.02 0.00 - 0.07 K/uL    Comment: Performed at [facility], [place] [address], [place], [place] [address]  CMP ([facility] only)     Status: Abnormal   Collection Time: 12/10/20 10:15 AM  Result Value Ref Range   Sodium 138 135 - 145 mmol/L   Potassium 4.0 3.5 - 5.1 mmol/L   Chloride 102 98 - 111 mmol/L   CO2 28 22 - 32 mmol/L   Glucose, Bld 127 (H) 70 - 99 mg/dL    Comment: Glucose reference range applies only to samples taken after fasting for at least 8 hours.  BUN 26 (H) 8 - 23 mg/dL   Creatinine 1.28 (H) 0.44 - 1.00 mg/dL   Calcium 8.8 (L) 8.9 - 10.3 mg/dL   Total Protein 6.6 6.5 - 8.1 g/dL   Albumin 3.5 3.5 - 5.0 g/dL   AST 17 15 - 41 U/L   ALT 19 0 - 44 U/L   Alkaline Phosphatase 102 38 - 126 U/L   Total Bilirubin 0.6 0.3 - 1.2 mg/dL   GFR, Estimated 45 (L) >60 mL/min    Comment: (NOTE) Calculated using the CKD-EPI Creatinine Equation (2021)    Anion gap 8 5 - 15    Comment: Performed at [facility], [place] [address], [place], [place] [address]  CBC with Differential     Status: Abnormal   Collection Time: 12/17/20 12:12 PM  Result Value Ref Range   WBC 5.3 4.0 - 10.5 K/uL   RBC 3.84 (L) 3.87 - 5.11 MIL/uL   Hemoglobin 11.8 (L) 12.0 - 15.0 g/dL   HCT 38.6 36.0 - 46.0 %   MCV 100.5 (H) 80.0 - 100.0 fL   MCH 30.7 26.0 - 34.0 pg   MCHC 30.6 30.0 - 36.0 g/dL   RDW 15.6 (H) 11.5 - 15.5 %    Platelets 110 (L) 150 - 400 K/uL    Comment: Immature Platelet Fraction may be clinically indicated, consider ordering this additional test [id number] REPEATED TO VERIFY PLATELET COUNT CONFIRMED BY SMEAR    nRBC 0.0 0.0 - 0.2 %   Neutrophils Relative % 67 %   Neutro Abs 3.5 1.7 - 7.7 K/uL   Lymphocytes Relative 21 %   Lymphs Abs 1.1 0.7 - 4.0 K/uL   Monocytes Relative 9 %   Monocytes Absolute 0.5 0.1 - 1.0 K/uL   Eosinophils Relative 2 %   Eosinophils Absolute 0.1 0.0 - 0.5 K/uL   Basophils Relative 1 %   Basophils Absolute 0.1 0.0 - 0.1 K/uL   Immature Granulocytes 0 %   Abs Immature Granulocytes 0.02 0.00 - 0.07 K/uL   Target Cells PRESENT    Stomatocytes PRESENT    Giant PLTs PRESENT     Comment: Performed at [facility], [place] [address], [place], [place] [address]  Basic metabolic panel     Status: Abnormal   Collection Time: 12/17/20 12:12 PM  Result Value Ref Range   Sodium 138 135 - 145 mmol/L   Potassium 4.0 3.5 - 5.1 mmol/L   Chloride 102 98 - 111 mmol/L   CO2 27 22 - 32 mmol/L   Glucose, Bld 123 (H) 70 - 99 mg/dL    Comment: Glucose reference range applies only to samples taken after fasting for at least 8 hours.   BUN 16 8 - 23 mg/dL   Creatinine, Ser 1.19 (H) 0.44 - 1.00 mg/dL   Calcium 8.8 (L) 8.9 - 10.3 mg/dL   GFR, Estimated 49 (L) >60 mL/min    Comment: (NOTE) Calculated using the CKD-EPI Creatinine Equation (2021)    Anion gap 9 5 - 15    Comment: Performed at [facility] [address] [place], [place] [address]  CMP ([facility] only)     Status: Abnormal   Collection Time: 12/31/20  9:37 AM  Result Value Ref Range   Sodium 139 135 - 145 mmol/L   Potassium 3.9 3.5 - 5.1 mmol/L   Chloride 100 98 - 111 mmol/L   CO2 31 22 - 32 mmol/L   Glucose, Bld 131 (  H) 70 - 99 mg/dL    Comment: Glucose reference range applies only to samples taken after fasting for at least 8 hours.   BUN 19 8 - 23 mg/dL   Creatinine 1.52 (H) 0.44 - 1.00 mg/dL    Calcium 8.7 (L) 8.9 - 10.3 mg/dL   Total Protein 7.2 6.5 - 8.1 g/dL   Albumin 3.9 3.5 - 5.0 g/dL   AST 20 15 - 41 U/L   ALT 17 0 - 44 U/L   Alkaline Phosphatase 102 38 - 126 U/L   Total Bilirubin 0.7 0.3 - 1.2 mg/dL   GFR, Estimated 37 (L) >60 mL/min    Comment: (NOTE) Calculated using the CKD-EPI Creatinine Equation (2021)    Anion gap 8 5 - 15    Comment: Performed at [facility], [place] [address], [place], [place] [address]  Folate, Serum     Status: None   Collection Time: 12/31/20  9:37 AM  Result Value Ref Range   Folate 32.7 >5.9 ng/mL    Comment: RESULTS CONFIRMED BY MANUAL DILUTION Performed at [facility] [address], [place], [place] [address]   Vitamin B12     Status: Abnormal   Collection Time: 12/31/20  9:37 AM  Result Value Ref Range   Vitamin B-12 1,460 (H) 180 - 914 pg/mL    Comment: (NOTE) This assay is not validated for testing neonatal or myeloproliferative syndrome specimens for Vitamin B12 levels. Performed at [facility], [place] [address] [place], [place] [address]   CBC with Differential ([facility] Only)     Status: Abnormal   Collection Time: 12/31/20  9:37 AM  Result Value Ref Range   WBC Count 5.1 4.0 - 10.5 K/uL   RBC 3.99 3.87 - 5.11 MIL/uL   Hemoglobin 12.2 12.0 - 15.0 g/dL   HCT 39.5 36.0 - 46.0 %   MCV 99.0 80.0 - 100.0 fL   MCH 30.6 26.0 - 34.0 pg   MCHC 30.9 30.0 - 36.0 g/dL   RDW 15.5 11.5 - 15.5 %   Platelet Count 176 150 - 400 K/uL   nRBC 0.6 (H) 0.0 - 0.2 %   Neutrophils Relative % 69 %   Neutro Abs 3.5 1.7 - 7.7 K/uL   Lymphocytes Relative 20 %   Lymphs Abs 1.0 0.7 - 4.0 K/uL   Monocytes Relative 7 %   Monocytes Absolute 0.4 0.1 - 1.0 K/uL   Eosinophils Relative 2 %   Eosinophils Absolute 0.1 0.0 - 0.5 K/uL   Basophils Relative 1 %   Basophils Absolute 0.1 0.0 - 0.1 K/uL   Immature Granulocytes 1 %   Abs Immature Granulocytes 0.04 0.00 - 0.07 K/uL    Comment:  Performed at [facility], [place] [address], [place], [place] [address]  Homocysteine, serum     Status: None   Collection Time: 12/31/20  9:37 AM  Result Value Ref Range   Homocysteine 13.5 0.0 - 17.2 umol/L    Comment: (NOTE) Performed At: [facility] [place], [place] [id number] Rush Farmer MD FB:[id number]   Methylmalonic acid, serum     Status: None   Collection Time: 12/31/20  9:37 AM  Result Value Ref Range   Methylmalonic Acid, Quantitative 208 0 - 378 nmol/L    Comment: (NOTE) This test was developed and its performance characteristics determined by [facility]. It has not been cleared or approved by the Food and Drug Administration. Performed  At: [facility] [place], [place] [id number] Rush Farmer MD IW:[id number]   Hepatitis B surface antibody     Status: None   Collection Time: 12/31/20  9:37 AM  Result Value Ref Range   Hep B S Ab NON REACTIVE NON REACTIVE    Comment: (NOTE) Inconsistent with immunity, less than 10 mIU/mL.  Performed at [facility], [place] [address], [place], [place] [address]   Hepatitis B surface antigen     Status: None   Collection Time: 12/31/20  9:37 AM  Result Value Ref Range   Hepatitis B Surface Ag NON REACTIVE NON REACTIVE    Comment: Performed at [facility] [address] [place], [place] [address]  Hepatitis C antibody     Status: None   Collection Time: 12/31/20  9:37 AM  Result Value Ref Range   HCV Ab NON REACTIVE NON REACTIVE    Comment: (NOTE) Nonreactive HCV antibody screen is consistent with no HCV infections,  unless recent infection is suspected or other evidence exists to indicate HCV infection.  Performed at [facility], [place] [address] [place], [place] [address]   Hepatitis B core antibody, total     Status: None   Collection Time: 12/31/20  9:37 AM  Result Value Ref Range   Hep B Core Total Ab NON REACTIVE NON REACTIVE     Comment: Performed at [facility] [address] [place], [place] [address]    Objective: General: Patient is awake, alert, and oriented x 3 and in no acute distress.  Integument: Skin is warm, dry and supple bilateral. Nails are tender, long, thickened and dystrophic with subungual debris, consistent with onychomycosis, 1-5 bilateral. No signs of infection. Dry wound to right anterior shin/leg with no acute signs of infection.   Vasculature:  Dorsalis Pedis pulse 0/4 bilateral. Posterior Tibial pulse  0/4 bilateral. Capillary fill time <5 sec 1-5 bilateral. No hair growth to the level of the digits.Temperature gradient within normal limits. No varicosities present bilateral. Chronic edema present bilateral.   Neuro: Gross sensation present via light touch however protective severely diminished bilateral.   Musculoskeletal: Asymptomatic pes planus pedal deformities noted bilateral. Muscular strength 4/5 in all lower extremity muscular groups in wheelchair.   Assessment and Plan: Problem List Items Addressed This Visit   None Visit Diagnoses     Pain due to onychomycosis of toenails of both feet    -  Primary   Relevant Medications   cefdinir (OMNICEF) 300 MG capsule   Diabetic polyneuropathy associated with type 2 diabetes mellitus (HCC)       Relevant Medications   eszopiclone (LUNESTA) 1 MG TABS tablet   Lymphedema           -Examined patient. -Re-Discussed and educated patient on diabetic foot care, especially with  regards to the vascular, neurological and musculoskeletal systems.  -Mechanically debrided all nails 1-5 bilateral using sterile nail nipper and filed with dremel without incident  -Patient as follow up with Dr. [name], Ortho regarding leg -Answered all patient questions -Patient to return  in 3 months for at risk foot care -Patient advised to call the office if any problems or questions arise in the meantime.  Landis Martins, DPM

## 2021-01-03 ENCOUNTER — Encounter: Payer: Self-pay | Admitting: Nurse Practitioner

## 2021-01-03 ENCOUNTER — Ambulatory Visit: Payer: Medicare PPO | Admitting: Nurse Practitioner

## 2021-01-03 VITALS — BP 120/80 | HR 87 | Temp 98.4°F | Ht 64.0 in | Wt 351.0 lb

## 2021-01-03 DIAGNOSIS — J45909 Unspecified asthma, uncomplicated: Secondary | ICD-10-CM

## 2021-01-03 DIAGNOSIS — E662 Morbid (severe) obesity with alveolar hypoventilation: Secondary | ICD-10-CM

## 2021-01-03 DIAGNOSIS — G4733 Obstructive sleep apnea (adult) (pediatric): Secondary | ICD-10-CM | POA: Diagnosis not present

## 2021-01-03 DIAGNOSIS — I82403 Acute embolism and thrombosis of unspecified deep veins of lower extremity, bilateral: Secondary | ICD-10-CM | POA: Diagnosis not present

## 2021-01-03 DIAGNOSIS — E669 Obesity, unspecified: Secondary | ICD-10-CM

## 2021-01-03 DIAGNOSIS — I5032 Chronic diastolic (congestive) heart failure: Secondary | ICD-10-CM

## 2021-01-03 DIAGNOSIS — G4736 Sleep related hypoventilation in conditions classified elsewhere: Secondary | ICD-10-CM

## 2021-01-03 NOTE — Assessment & Plan Note (Addendum)
Previous hospitalization related to hypercapnia. Continue BiPAP therapy at night for CO2 blow off. Weight loss discussed; activity limited with periodic use of wheelchair. See above plan.

## 2021-01-03 NOTE — Assessment & Plan Note (Signed)
Stable. Follow up with cardiology as scheduled.

## 2021-01-03 NOTE — Assessment & Plan Note (Signed)
See above plan. 

## 2021-01-03 NOTE — Assessment & Plan Note (Signed)
Stable. Follow up with hematology as ordered.

## 2021-01-03 NOTE — Patient Instructions (Addendum)
-  Continue on BiPAP auto 5-20, pressure support of 4 with backup rate of 10 for CO2 blow off and to reduce your risk of rehospitalization  Use for a minimum 6 hours a night, with goal of using entire night.  Change equipment every 30 days or as directed by DME.  Maintain clean equipment, as directed by home health agency.  Be aware of reduced alertness and do not drive or operate heavy machinery if experiencing this or drowsiness.  Exercise encouraged, as tolerated. Healthy weight management discussed.  Avoid or decrease alcohol consumption and medications that make you more sleepy, if possible.  -Continue nighttime supplemental O2 therapy at 1L. We will resend the order for your overnight oximetry test  -Continue Symbicort 1 puff Twice daily, rinse after -Change Pulmicort inhaler to 2 puffs twice a day as needed for shortness of breath or wheezing  -Continue Ventolin inhaler 2 puffs as needed every 4 hours as needed for shortness of breath or wheezing  -Continue Singulair 10 mg At bedtime  Follow up with cardiology, ortho, and hematology as scheduled.   Asthma Action Plan in place Avoid triggers, when able.  Exercise encouraged. Notify if worsening symptoms upon exertion.  Notify and seek help if symptoms unrelieved by rescue inhaler.   Follow up in 3 months with Dr. Ander Slade or APP. If symptoms do not improve or worsen, please contact office for sooner follow up or seek emergency care.

## 2021-01-03 NOTE — Assessment & Plan Note (Signed)
Symptoms well-controlled on maintenance regimen. Change pulmicort to PRN. Continue Symbicort Twice daily, as previously ordered. Rescue inhaler as needed. See above plan.

## 2021-01-03 NOTE — Assessment & Plan Note (Addendum)
Split night sleep study with SpO2 of 84% requiring 1L O2. No evidence of OSA. ONO was previously ordered but never completed. Will reorder today. Continue BiPAP therapy for obesity hypoventilation syndrome. Significant visit time spent educating on need for nocturnal O2 and BiPAP. Verbalized understanding.   Patient Instructions  -Continue on BiPAP auto 5-20, pressure support of 4 with backup rate of 10 for CO2 blow off and to reduce your risk of rehospitalization  Use for a minimum 6 hours a night, with goal of using entire night.  Change equipment every 30 days or as directed by DME.  Maintain clean equipment, as directed by home health agency.  Be aware of reduced alertness and do not drive or operate heavy machinery if experiencing this or drowsiness.  Exercise encouraged, as tolerated. Healthy weight management discussed.  Avoid or decrease alcohol consumption and medications that make you more sleepy, if possible.  -Continue nighttime supplemental O2 therapy at 1L. We will resend the order for your overnight oximetry test  -Continue Symbicort 1 puff Twice daily, rinse after -Change Pulmicort inhaler to 2 puffs twice a day as needed for shortness of breath or wheezing  -Continue Ventolin inhaler 2 puffs as needed every 4 hours as needed for shortness of breath or wheezing  -Continue Singulair 10 mg At bedtime  Follow up with cardiology, ortho, and hematology as scheduled.   Asthma Action Plan in place Avoid triggers, when able.  Exercise encouraged. Notify if worsening symptoms upon exertion.  Notify and seek help if symptoms unrelieved by rescue inhaler.   Follow up in 3 months with Dr. Ander Slade or APP. If symptoms do not improve or worsen, please contact office for sooner follow up or seek emergency care.

## 2021-01-06 ENCOUNTER — Telehealth: Payer: Self-pay | Admitting: Internal Medicine

## 2021-01-06 DIAGNOSIS — I130 Hypertensive heart and chronic kidney disease with heart failure and stage 1 through stage 4 chronic kidney disease, or unspecified chronic kidney disease: Secondary | ICD-10-CM | POA: Diagnosis not present

## 2021-01-06 DIAGNOSIS — J9621 Acute and chronic respiratory failure with hypoxia: Secondary | ICD-10-CM | POA: Diagnosis not present

## 2021-01-06 DIAGNOSIS — I2510 Atherosclerotic heart disease of native coronary artery without angina pectoris: Secondary | ICD-10-CM | POA: Diagnosis not present

## 2021-01-06 DIAGNOSIS — D631 Anemia in chronic kidney disease: Secondary | ICD-10-CM | POA: Diagnosis not present

## 2021-01-06 DIAGNOSIS — N1832 Chronic kidney disease, stage 3b: Secondary | ICD-10-CM | POA: Diagnosis not present

## 2021-01-06 DIAGNOSIS — J9622 Acute and chronic respiratory failure with hypercapnia: Secondary | ICD-10-CM | POA: Diagnosis not present

## 2021-01-06 DIAGNOSIS — J45909 Unspecified asthma, uncomplicated: Secondary | ICD-10-CM | POA: Diagnosis not present

## 2021-01-06 DIAGNOSIS — E1122 Type 2 diabetes mellitus with diabetic chronic kidney disease: Secondary | ICD-10-CM | POA: Diagnosis not present

## 2021-01-06 DIAGNOSIS — I5033 Acute on chronic diastolic (congestive) heart failure: Secondary | ICD-10-CM | POA: Diagnosis not present

## 2021-01-06 NOTE — Telephone Encounter (Signed)
Inbound call from patient need PA for plenvu.

## 2021-01-06 NOTE — Telephone Encounter (Signed)
Patient advised that rx coupon was given to pharmacist, making rx 60 dollars. She verbalizes understanding.

## 2021-01-07 NOTE — Progress Notes (Signed)
Advanced Heart Failure Clinic Note   PCP: [name], MD PCP-Cardiologist: None  HF Cardiologist: Dr. Haroldine Laws  Reason for visit: f/u for chronic diastolic heart failure   HPI: April [name] is a 70 y.o. woman with history of morbid obesity Body mass index is 65.23 kg/m., DM2, HTN, HLD, mild-to- moderate CAD, asthma, and chronic dHF.  She underwent catheterization in (2/08), which showed 50-60% LAD lesion and 30% RCA lesion.  EF was 70%.  She had mild pulmonary hypertension, primarily due to high output state.  Her mean pulmonary artery pressure was 27, wedge pressure was 10. PVR was 1.80 Woods units. Her last echo was in 2017 and showed normal LVEF 65-70%, G1DD, RV normal.    Admitted 4/22 for a/c dCHF & acute hypoxic respiratory failure, with likely component of acute asthma exacerbation as well as for management of acute bilateral LE DVT w/ ? Superimposed cellulitis. Treated w/ IV abx and IV Lasix. Echo showed normal LVEF 60-65%. RV normal. She was diuresed and placed back on PO diuretics, switched from lasix 80 bid to torsemide 60 mg d/c. D/c wt 371 lb.   She saw pulmonology for OSA 10/02/20, sleep study ordered, low dose Lunesta added.  Sent to ED 10/22 from clinic and admitted with concerns of CO2 retention and CHF exacerbation. She was placed on BiPap and given IV lasix. Underwent RHC showing mild to moderate pulmonary hypertension, minimally elevated PCWP (18) and normal CO. Pulmonology consulted and arranged for NIPPV at discharge. Carvedilol switched to bystolic. Hospitalization c/b UTI. She declined SNF and was discharged home with [facility] PT/OT. Discharge weight 351 lbs.  Sleep study 11/22 showed no OSA but + nocturnal desaturations to 84%, ONO ordered.  Today she returns for HF follow up with her husband. She uses O2 PRN. Her belly is more bloated. Wears BiPap at night but unable to wear all night due to possible leak, having this addressed w/ home health company. Breathing is about  the same, some SOB with getting up and going to bathroom. Having some nausea. Denies abnormal bleeding, palpitations, CP, dizziness, edema, or PND/Orthopnea. Appetite ok. No fever or chills. Does not check weight at home (does not have safe area to weigh on flat surface, husband says he can help her with this). Taking all medications.   Cardiac Studies: - RHC (10/22):  RA = 11 RV = 68/12 PA = 69/22 (39) PCW = 18 Fick cardiac output/index = 7.0/2.8 PVR = 3.0 WU Ao sat = 96% PA sat = 61%, 62% SVC sat 69% PAPi = 4.2  - Echo (10/22): EF 65-70%, grade I DD, RV ok - Echo 4/22: EF 60-65%. RV ok.   ROS: All systems reviewed and negative except as per HPI.   Past Medical History:  Diagnosis Date   Anemia    Asthma    CAD (coronary artery disease)    Carpal tunnel syndrome    Cellulitis of both lower extremities 04/11/2015   CHF (congestive heart failure) (HCC)    Chronic kidney disease    CKI- followed by [facility]   Colon polyp, hyperplastic 6606 & 3016   Complication of anesthesia 1999   svt with renal calculi surgery, no problems since   CTS (carpal tunnel syndrome)    bilateral   Diabetes mellitus    Eczema    GERD (gastroesophageal reflux disease)    History of kidney stones 1999   Hyperlipidemia    Hypertension    Leg ulcer ([place]) 04/24/2015  Right lateral leg No evidence of an infection Monitor closely Keep edema controlled    Leg ulcer (HCC)    right lower   Meralgia paresthetica    Dr. [name]   Morbid obesity [facility])    Neuropathy    toes and legs   Osteoarthrosis, unspecified whether generalized or localized, lower leg    knee   PUD (peptic ulcer disease)    Shortness of breath dyspnea    with exertion   Sleep apnea    per progress note 02/25/2018   Type II or unspecified type diabetes mellitus without mention of complication, not stated as uncontrolled    Unspecified hereditary and idiopathic peripheral neuropathy    Urticaria    Vitamin B12 deficiency     Wears glasses    Wound cellulitis    right upper leg, healing well   Current Outpatient Medications  Medication Sig Dispense Refill   acetaminophen (TYLENOL) 325 MG tablet Take 2 tablets (650 mg total) by mouth every 6 (six) hours as needed for moderate pain (or Fever >/= 101). 20 tablet 0   Alcohol Swabs (DROPSAFE ALCOHOL PREP) 70 % PADS USE TOPICALLY AS DIRECTED 300 each 3   allopurinol (ZYLOPRIM) 100 MG tablet Take 2 tablets (200 mg total) by mouth daily. 180 tablet 1   apixaban (ELIQUIS) 5 MG TABS tablet Take 1 tablet (5 mg total) by mouth 2 (two) times daily. 60 tablet 1   atorvastatin (LIPITOR) 40 MG tablet Take 1 tablet (40 mg total) by mouth at bedtime. 90 tablet 2   Blood Glucose Calibration (TRUE METRIX LEVEL 1) Low SOLN UAD  E11.9 1 each 3   Blood Glucose Monitoring Suppl (TRUE METRIX METER) DEVI True Metrix Level 1 solution     Budesonide (PULMICORT FLEXHALER) 90 MCG/ACT inhaler Inhale 1 puff into the lungs 2 (two) times daily as needed for shortness of breath or wheezing.     budesonide-formoterol (SYMBICORT) 160-4.5 MCG/ACT inhaler one - two inhalations every 12 hours; gargle and spit after use 1 Inhaler 4   calcium carbonate (TUMS - DOSED IN MG ELEMENTAL CALCIUM) 500 MG chewable tablet Chew 2 tablets by mouth daily as needed for indigestion or heartburn.     cefdinir (OMNICEF) 300 MG capsule cefdinir 300 mg capsule     cholecalciferol (VITAMIN D3) 25 MCG (1000 UNIT) tablet Take 1 tablet (1,000 Units total) by mouth daily. 90 tablet 1   COLCHICINE PO Take 1.2 mg by mouth as needed. 2 tablets     cyanocobalamin (,VITAMIN B-12,) 1000 MCG/ML injection INJECT 1ML INTO MUSCLE EVERY 30 DAYS 10 mL 5   diclofenac sodium (VOLTAREN) 1 % GEL Apply 2 g topically 4 (four) times daily. 100 g 11   eszopiclone (LUNESTA) 1 MG TABS tablet eszopiclone 1 mg tablet     famotidine (PEPCID) 20 MG tablet Take 1 tablet (20 mg total) by mouth 2 (two) times daily. 180 tablet 3   FEROSUL 325 (65 Fe) MG  tablet TAKE 1 TABLET BY MOUTH EVERY MONDAY, WEDNESDAY, AND FRIDAY 60 tablet 0   folic acid (FOLVITE) 034 MCG tablet Take 1 tablet (400 mcg total) by mouth at bedtime. Take 400 mcg by mouth at bedtime. (Patient taking differently: Take 400 mcg by mouth at bedtime. Take 400 mcg by mouth at bedtime.) 90 tablet 1   gabapentin (NEURONTIN) 100 MG capsule TAKE 1 CAPSULE(100 MG) BY MOUTH THREE TIMES DAILY 90 capsule 1   hydrOXYzine (ATARAX/VISTARIL) 25 MG tablet Take 1 tablet (25 mg  total) by mouth 3 (three) times daily as needed for itching. 30 tablet 0   montelukast (SINGULAIR) 10 MG tablet TAKE 1 TABLET(10 MG) BY MOUTH AT BEDTIME 30 tablet 5   nebivolol (BYSTOLIC) 5 MG tablet Take 1 tablet (5 mg total) by mouth daily. 30 tablet 3   Oxcarbazepine (TRILEPTAL) 300 MG tablet Take 1 tablet (300 mg total) by mouth 2 (two) times daily. 180 tablet 1   oxyCODONE (OXY IR/ROXICODONE) 5 MG immediate release tablet oxycodone 5 mg tablet     PEG-KCl-NaCl-NaSulf-Na Asc-C (PLENVU) 140 g SOLR Take 1 kit by mouth as directed. Use coupon: BIN: 101751 PNC: CNRX Group: [id number] ID: [id number] 1 each 0   polyethylene glycol (MIRALAX / GLYCOLAX) 17 g packet Take 17 g by mouth as needed.     potassium chloride SA (KLOR-CON) 20 MEQ tablet Take 1 tablet (20 mEq total) by mouth daily. 10 tablet 0   senna-docusate (SENOKOT-S) 8.6-50 MG tablet Take 1 tablet by mouth at bedtime as needed for mild constipation. 30 tablet 0   spironolactone (ALDACTONE) 25 MG tablet TAKE 1 TABLET(25 MG) BY MOUTH DAILY 90 tablet 0   VENTOLIN HFA 108 (90 Base) MCG/ACT inhaler Inhale 2 puffs into the lungs every 4 (four) hours as needed for wheezing.     Torsemide 40 MG TABS Take 80 mg by mouth 2 (two) times daily. 360 tablet 2   No current facility-administered medications for this encounter.   Allergies  Allergen Reactions   Penicillins Rash and Other (See Comments)    She was told not to take it anymore. Has patient had a PCN reaction causing  immediate rash, facial/tongue/throat swelling, SOB or lightheadedness with hypotension: Yes Has patient had a PCN reaction causing severe rash involving mucus membranes or skin necrosis: No Has patient had a PCN reaction that required hospitalization: No Has patient had a PCN reaction occurring within the last 10 years: No If all of the above answers are "NO", then may proceed with Cephalosporin use.'   Shellfish Allergy Anaphylaxis   Sulfonamide Derivatives Anaphylaxis and Other (See Comments)   Hydrochlorothiazide W-Triamterene Other (See Comments)    Hypokalemia   Lotensin [Benazepril Hcl] Hives   Dipyridamole Other (See Comments)    Unknown reaction   Estrogens Other (See Comments)    Unknown reaction   Hydrochlorothiazide Other (See Comments)    Unknown reaction   Latex Other (See Comments)    Unknown reaction - stated previously during surgery gloves had to be changed, but unsure if it is still an allergy or not.    Metronidazole Other (See Comments)    Unknown reaction   Other Itching    PICKLES   Spironolactone Other (See Comments)    UNSPECIFIED > "kidney problems"   Sulfa Antibiotics Other (See Comments)    Unknown reaction   Torsemide Other (See Comments)    Unknown reaction   Valsartan Other (See Comments)    Unknown reaction   Mustard [Allyl Isothiocyanate] Itching   Social History   Socioeconomic History   Marital status: Married    Spouse name: April [name]   Number of children: 1   Years of education: BS   Highest education level: Not on file  Occupational History   Occupation: Disabled    Employer: RETIRED  Tobacco Use   Smoking status: Never   Smokeless tobacco: Never  Vaping Use   Vaping Use: Never used  Substance and Sexual Activity   Alcohol use: No    Alcohol/week: 0.0  standard drinks   Drug use: No   Sexual activity: Not Currently  Other Topics Concern   Not on file  Social History Narrative   Patient lives at home with her husband April [name]) .  Patient is retired and has a Conservation officer, nature.    Caffeine - some times.   Right handed.   Social Determinants of Health   Financial Resource Strain: Not on file  Food Insecurity: Not on file  Transportation Needs: Not on file  Physical Activity: Not on file  Stress: Not on file  Social Connections: Not on file  Intimate Partner Violence: Not on file   Family History  Problem Relation Age of Onset   Colon cancer Mother    Prostate cancer Father    Colon cancer Father    Diabetes Maternal Aunt    Breast cancer Maternal Aunt    Diabetes Maternal Uncle    Diabetes Paternal Aunt    Stroke Paternal Aunt        > 50   Heart disease Paternal Aunt    Diabetes Paternal Uncle    Breast cancer Maternal Aunt         X 2   Breast cancer Cousin    BP 116/70   Pulse 73   Wt (!) 164.2 kg (362 lb)   SpO2 99% Comment: 2L of o2  BMI 62.14 kg/m   Wt Readings from Last 3 Encounters:  01/10/21 (!) 164.2 kg (362 lb)  01/03/21 (!) 159.2 kg (351 lb)  12/20/20 (!) 159.2 kg (351 lb)   PHYSICAL EXAM: General:  NAD. No resp difficulty, arrived in WC, 1L oxygen [place] placed in clinic HEENT: Normal Neck: Supple. Thick neck. Carotids 2+ bilat; no bruits. No lymphadenopathy or thryomegaly appreciated. Cor: PMI nondisplaced. Regular rate & rhythm. No rubs, gallops or murmurs. Lungs: Clear Abdomen: Obese, nontender, nondistended. No hepatosplenomegaly. No bruits or masses. Good bowel sounds. Extremities: No cyanosis, clubbing, rash, trace BLE pre-tibial edema, bilateral UNNA boots on Neuro: Alert & oriented x 3, cranial nerves grossly intact. Moves all 4 extremities w/o difficulty. Affect pleasant.  ReDs: 25%  ASSESSMENT & PLAN: 1. Chronic Hypoxic Respiratory Failure - Suspect driven by OHS, split night sleep study showed no OSA - Continue BiPAP qhs & continue oxygen during the day. - Needs wt loss but mobility limited by severe knee/hip OA.  - RHC (10/22): c/w mid-mod PH.    2. Chronic  Diastolic Heart Failure  - RHC 2008 c/w mild pulmonary hypertension, primarily due to high output state.  Her mean pulmonary artery pressure was 27, wedge pressure was 10. PVR was 1.80 Woods units. - Echo (2017): LVEF 65-70%, G1DD, RV normal - Recent admit for a/c dCHF. Volume improved w/ IV Lasix.  - Echo (4/22): LVEF 60-65%, G2DD  - Echo (10/22): EF 65%, Grade 1 DD, RV normal. - RHC (10/22): w/ mild-mod PH, minimally elevated PCWP (18) - NYHA III, functional status difficult due to body habitus, asthma/chronic resp failure, and physical deconditioning. Volume OK today on exam, but weight up about 8 lbs. ReDs 25%. - Increase torsemide to 80 mg bid - Stop Diamox (discussed with Dr. Haroldine Laws) - Continue spiro 25 mg daily.  - Continue Bystolic 5 mg daily  - No SGLT2i given risk of GU infection given morbid obesity/ large panus. - Discussed limiting sodium and fluids to <2L/day. - BMET/BNP today.   3. Bilateral LE DVT (Diagnosed 4/22) - Confirmed by venous dopplers. Chest CT negative for PE. - Now  on Eliquis. No bleeding issues. - CBC on 12/31/20 stable   4. Asthma  - On Symbicort. Follows with Pulmonary. - Lungs clear on exam today.   5. CAD:  - LHC in 2008 showed 50-60% LAD lesion and 30% RCA lesion. - No chest pain.  - Continue ? blocker and statin. - No ASA given Eliquis for DVT tx.    6. Stage IIIb CKD - Baseline SCr ~1.5. - Followed by Dr. [name] at [facility]. - BMET today.   Follow up in 2 months with Dr. Haroldine Laws as scheduled.  Allena Katz, FNP-BC 01/10/21

## 2021-01-09 ENCOUNTER — Ambulatory Visit: Payer: Medicare PPO | Admitting: Orthopedic Surgery

## 2021-01-09 DIAGNOSIS — Z945 Skin transplant status: Secondary | ICD-10-CM

## 2021-01-09 DIAGNOSIS — M25562 Pain in left knee: Secondary | ICD-10-CM | POA: Diagnosis not present

## 2021-01-10 ENCOUNTER — Telehealth: Payer: Self-pay

## 2021-01-10 ENCOUNTER — Other Ambulatory Visit: Payer: Self-pay

## 2021-01-10 ENCOUNTER — Telehealth: Payer: Self-pay | Admitting: Family

## 2021-01-10 ENCOUNTER — Ambulatory Visit (HOSPITAL_COMMUNITY)
Admission: RE | Admit: 2021-01-10 | Discharge: 2021-01-10 | Disposition: A | Discharge status: Home / Self Care | Payer: Medicare PPO | Source: Ambulatory Visit | Attending: Family Medicine | Admitting: Family Medicine

## 2021-01-10 ENCOUNTER — Encounter (HOSPITAL_COMMUNITY): Payer: Self-pay

## 2021-01-10 VITALS — BP 116/70 | HR 73 | Wt 362.0 lb

## 2021-01-10 DIAGNOSIS — Z7901 Long term (current) use of anticoagulants: Secondary | ICD-10-CM | POA: Insufficient documentation

## 2021-01-10 DIAGNOSIS — J45909 Unspecified asthma, uncomplicated: Secondary | ICD-10-CM | POA: Diagnosis not present

## 2021-01-10 DIAGNOSIS — J9611 Chronic respiratory failure with hypoxia: Secondary | ICD-10-CM | POA: Diagnosis not present

## 2021-01-10 DIAGNOSIS — E1122 Type 2 diabetes mellitus with diabetic chronic kidney disease: Secondary | ICD-10-CM | POA: Diagnosis not present

## 2021-01-10 DIAGNOSIS — Z79899 Other long term (current) drug therapy: Secondary | ICD-10-CM | POA: Insufficient documentation

## 2021-01-10 DIAGNOSIS — R140 Abdominal distension (gaseous): Secondary | ICD-10-CM | POA: Insufficient documentation

## 2021-01-10 DIAGNOSIS — I825Z3 Chronic embolism and thrombosis of unspecified deep veins of distal lower extremity, bilateral: Secondary | ICD-10-CM | POA: Diagnosis not present

## 2021-01-10 DIAGNOSIS — E785 Hyperlipidemia, unspecified: Secondary | ICD-10-CM | POA: Insufficient documentation

## 2021-01-10 DIAGNOSIS — I5032 Chronic diastolic (congestive) heart failure: Secondary | ICD-10-CM | POA: Insufficient documentation

## 2021-01-10 DIAGNOSIS — I2510 Atherosclerotic heart disease of native coronary artery without angina pectoris: Secondary | ICD-10-CM | POA: Diagnosis not present

## 2021-01-10 DIAGNOSIS — Z86718 Personal history of other venous thrombosis and embolism: Secondary | ICD-10-CM | POA: Insufficient documentation

## 2021-01-10 DIAGNOSIS — Z6841 Body Mass Index (BMI) 40.0 and over, adult: Secondary | ICD-10-CM | POA: Diagnosis not present

## 2021-01-10 DIAGNOSIS — N1832 Chronic kidney disease, stage 3b: Secondary | ICD-10-CM | POA: Insufficient documentation

## 2021-01-10 DIAGNOSIS — R110 Nausea: Secondary | ICD-10-CM | POA: Diagnosis not present

## 2021-01-10 DIAGNOSIS — Z09 Encounter for follow-up examination after completed treatment for conditions other than malignant neoplasm: Secondary | ICD-10-CM | POA: Insufficient documentation

## 2021-01-10 DIAGNOSIS — I130 Hypertensive heart and chronic kidney disease with heart failure and stage 1 through stage 4 chronic kidney disease, or unspecified chronic kidney disease: Secondary | ICD-10-CM | POA: Insufficient documentation

## 2021-01-10 DIAGNOSIS — M7989 Other specified soft tissue disorders: Secondary | ICD-10-CM

## 2021-01-10 LAB — BASIC METABOLIC PANEL
Anion gap: 6 (ref 5–15)
BUN: 21 mg/dL (ref 8–23)
CO2: 34 mmol/L — ABNORMAL HIGH (ref 22–32)
Calcium: 8.9 mg/dL (ref 8.9–10.3)
Chloride: 97 mmol/L — ABNORMAL LOW (ref 98–111)
Creatinine, Ser: 1.47 mg/dL — ABNORMAL HIGH (ref 0.44–1.00)
GFR, Estimated: 38 mL/min — ABNORMAL LOW (ref >60)
Glucose, Bld: 140 mg/dL — ABNORMAL HIGH (ref 70–99)
Potassium: 4.5 mmol/L (ref 3.5–5.1)
Sodium: 137 mmol/L (ref 135–145)

## 2021-01-10 LAB — BRAIN NATRIURETIC PEPTIDE: B Natriuretic Peptide: 96.7 pg/mL (ref 0.0–100.0)

## 2021-01-10 MED ORDER — TORSEMIDE 40 MG PO TABS: 80.0000 mg | ORAL_TABLET | Freq: Two times a day (BID) | ORAL | 2 refills | Status: DC

## 2021-01-10 NOTE — Progress Notes (Signed)
bi 

## 2021-01-10 NOTE — Patient Instructions (Addendum)
Labs done today. We will contact you only if your labs are abnormal.  STOP taking Acetazolamide (Diamox)  INCREASE Toresmide to 80mg  (2 tablets) by mouth 2 times daily.   No other medication changes were made. Please continue all current medications as prescribed.  Your physician recommends that you keep your scheduled appointment.  If you have any questions or concerns before your next appointment please send [place] a message through [place] or call our office at [phone].    TO LEAVE A MESSAGE FOR THE NURSE SELECT OPTION 2, PLEASE LEAVE A MESSAGE INCLUDING: YOUR NAME DATE OF BIRTH CALL BACK NUMBER REASON FOR CALL**this is important as we prioritize the call backs  YOU WILL RECEIVE A CALL BACK THE SAME DAY AS LONG AS YOU CALL BEFORE 4:00 PM   Do the following things EVERYDAY: Weigh yourself in the morning before breakfast. Write it down and keep it in a log. Take your medicines as prescribed Eat low salt foods--Limit salt (sodium) to 2000 mg per day.  Stay as active as you can everyday Limit all fluids for the day to less than 2 liters   At the [facility], you and your health needs are our priority. As part of our continuing mission to provide you with exceptional heart care, we have created designated Provider Care Teams. These Care Teams include your primary Cardiologist (physician) and Advanced Practice Providers (APPs- Physician Assistants and Nurse Practitioners) who all work together to provide you with the care you need, when you need it.   You may see any of the following providers on your designated Care Team at your next follow up: Dr Glori Bickers Dr Haynes Kerns, NP Lyda Jester, [place] Audry Riles, PharmD   Please be sure to bring in all your medications bottles to every appointment.

## 2021-01-10 NOTE — Telephone Encounter (Signed)
I called and lm on vm for pt to call the office back. [facility] can not have HHN go out and see the pt until she has had ABI studies BLE. I am callig to see if the pt wants to have the studies and then approval for [facility] with [facility] or if she is able to find a way to the office weekly for [place] to apply compression dressings. Will hold message and discuss with pt when she returns call.

## 2021-01-10 NOTE — Telephone Encounter (Signed)
I called the pt and she would like to be set up for ABI studies. This order was entered in the chart and advised that I will schedule her for an appt to change her dressings on Friday at 9 am pending the appt with that office. If they can do the studies early next week then we can cx the appt in the office and set up for [facility].

## 2021-01-10 NOTE — Telephone Encounter (Signed)
Pt called back and I made her aware of the previous message. The pt would like a CB to explain what an ABI study is; she will be free at 1 for a CB.   [phone]

## 2021-01-13 ENCOUNTER — Ambulatory Visit (HOSPITAL_COMMUNITY): Admission: RE | Admit: 2021-01-13 | Payer: Medicare PPO | Source: Ambulatory Visit

## 2021-01-13 ENCOUNTER — Telehealth: Payer: Self-pay

## 2021-01-13 ENCOUNTER — Telehealth: Payer: Self-pay | Admitting: Internal Medicine

## 2021-01-13 DIAGNOSIS — I130 Hypertensive heart and chronic kidney disease with heart failure and stage 1 through stage 4 chronic kidney disease, or unspecified chronic kidney disease: Secondary | ICD-10-CM | POA: Diagnosis not present

## 2021-01-13 DIAGNOSIS — J9621 Acute and chronic respiratory failure with hypoxia: Secondary | ICD-10-CM | POA: Diagnosis not present

## 2021-01-13 DIAGNOSIS — J45909 Unspecified asthma, uncomplicated: Secondary | ICD-10-CM | POA: Diagnosis not present

## 2021-01-13 DIAGNOSIS — I5033 Acute on chronic diastolic (congestive) heart failure: Secondary | ICD-10-CM | POA: Diagnosis not present

## 2021-01-13 DIAGNOSIS — N1832 Chronic kidney disease, stage 3b: Secondary | ICD-10-CM | POA: Diagnosis not present

## 2021-01-13 DIAGNOSIS — J9622 Acute and chronic respiratory failure with hypercapnia: Secondary | ICD-10-CM | POA: Diagnosis not present

## 2021-01-13 DIAGNOSIS — D631 Anemia in chronic kidney disease: Secondary | ICD-10-CM | POA: Diagnosis not present

## 2021-01-13 DIAGNOSIS — I2510 Atherosclerotic heart disease of native coronary artery without angina pectoris: Secondary | ICD-10-CM | POA: Diagnosis not present

## 2021-01-13 DIAGNOSIS — E1122 Type 2 diabetes mellitus with diabetic chronic kidney disease: Secondary | ICD-10-CM | POA: Diagnosis not present

## 2021-01-13 NOTE — Telephone Encounter (Signed)
Pt is having a ABI tomorrow at the hospital she stated that Autumn told her to come in to have her leg rewrapped after. She just wanted clarification that that was correct. And she wanted to know if she needed to keep her apt with [facility] Friday ?

## 2021-01-13 NOTE — Telephone Encounter (Signed)
Pt will come in today after ABI study for bilateral wraps

## 2021-01-13 NOTE — Telephone Encounter (Signed)
[facility] has called to report a missed visit on last Wednesday. States pt could not be seen, due to having other appointments.    Callback #- [phone]

## 2021-01-13 NOTE — Telephone Encounter (Signed)
Called pt to advise that yes she should come here to wrap her legs. She will have the dressings we applied on Thursday removed for the exam and she will need to come here to reapply. Depending on results [facility] will pick up HHN dressing changes. Will hold message and awiat results of ABI

## 2021-01-13 NOTE — Telephone Encounter (Signed)
This pt will be coming in after ABIs for wraps. She is on the nurse only schedule. Thanks!

## 2021-01-14 ENCOUNTER — Other Ambulatory Visit: Payer: Self-pay

## 2021-01-14 ENCOUNTER — Encounter: Payer: Self-pay | Admitting: Orthopedic Surgery

## 2021-01-14 ENCOUNTER — Ambulatory Visit (INDEPENDENT_AMBULATORY_CARE_PROVIDER_SITE_OTHER): Payer: Medicare PPO | Admitting: Radiology

## 2021-01-14 ENCOUNTER — Ambulatory Visit (HOSPITAL_COMMUNITY)
Admission: RE | Admit: 2021-01-14 | Discharge: 2021-01-14 | Disposition: A | Discharge status: Home / Self Care | Payer: Medicare PPO | Source: Ambulatory Visit | Attending: Orthopedic Surgery | Admitting: Orthopedic Surgery

## 2021-01-14 ENCOUNTER — Encounter: Payer: Self-pay | Admitting: Radiology

## 2021-01-14 VITALS — Ht 64.0 in | Wt 362.0 lb

## 2021-01-14 DIAGNOSIS — M7989 Other specified soft tissue disorders: Secondary | ICD-10-CM | POA: Diagnosis not present

## 2021-01-14 NOTE — Telephone Encounter (Signed)
Results faxed to [facility] to see if they can staff this referal for compression dressing wraps weekly. Will hold until I have heard back and then will notify the pt.

## 2021-01-14 NOTE — Telephone Encounter (Signed)
I called pt and advised that [facility] will accept the referral and they will contact her to make an appt for next Tuesday. Pt has follow up in our office on 02/07/21 will call with any questions.

## 2021-01-14 NOTE — Progress Notes (Addendum)
Patient came in for bilateral lower extremity wraps, UNNA and Profore, post ABI appointment today. Both legs were wrapped with no problems.

## 2021-01-14 NOTE — Progress Notes (Signed)
Office Visit Note   Patient: April [name]           Date of Birth: 1950-11-07           MRN: [id number] Visit Date: 01/09/2021              Requested by: [name], MD [place],  [place] [address] PCP: [name], MD  Chief Complaint  Patient presents with   Right Leg - Wound Check    s/p split thickess skin graft      HPI: Patient is a 70 year old woman who is status post previous split-thickness skin graft to the right leg.  Patient states last week she started developing blisters with weeping and swelling and throbbing pain.  Assessment & Plan: Visit Diagnoses:  1. S/P split thickness skin graft     Plan: The right lower extremity was wrapped with a compression wrap.  Will set her up with home health nursing to change the dressing weekly at home.  Follow-Up Instructions: Return in about 4 weeks (around 02/06/2021).   Ortho Exam  Patient is alert, oriented, no adenopathy, well-dressed, normal affect, normal respiratory effort. Examination patient has increased venous swelling of the right lower extremity with weeping edema.  Patient states she is not walking she is in a reclining chair with her legs dependent.  There is no redness or cellulitis no signs of infection.  Imaging: VAS [place] ABI WITH/WO TBI  Result Date: 01/14/2021  LOWER EXTREMITY DOPPLER STUDY Patient Name:  April [name]  Date of Exam:   01/14/2021 Medical Rec #: [id number]          Accession #:    [id number] Date of Birth: 10/21/1950           Patient Gender: F Patient Age:   89 years Exam Location:  [facility] Procedure:      VAS [place] ABI WITH/WO TBI Referring Phys: Velera Lans[place] --------------------------------------------------------------------------------  Indications: Ulceration. High Risk         Hypertension, hyperlipidemia, Diabetes, coronary artery Factors:          disease. Other Factors: History of DVT.  Limitations: Today's exam was limited due to Body habitus- BMI  62.14, wounds on              right ankle. Comparison Study: No prior arterial studies. Performing Technologist: Darlin Coco RDMS RVT  Examination Guidelines: A complete evaluation includes at minimum, Doppler waveform signals and systolic blood pressure reading at the level of bilateral brachial, anterior tibial, and posterior tibial arteries, when vessel segments are accessible. Bilateral testing is considered an integral part of a complete examination. Photoelectric Plethysmograph (PPG) waveforms and toe systolic pressure readings are included as required and additional duplex testing as needed. Limited examinations for reoccurring indications may be performed as noted.  ABI Findings: +---------+------------------+-----+---------+--------+  Right     Rt Pressure (mmHg) Index Waveform  Comment   +---------+------------------+-----+---------+--------+  Brachial  153                      triphasic           +---------+------------------+-----+---------+--------+  PTA       149                0.94  triphasic           +---------+------------------+-----+---------+--------+  DP        157  0.99  triphasic           +---------+------------------+-----+---------+--------+  Great Toe 131                0.82                      +---------+------------------+-----+---------+--------+ +---------+------------------+-----+---------+-------+  Left      Lt Pressure (mmHg) Index Waveform  Comment  +---------+------------------+-----+---------+-------+  Brachial  159                      triphasic          +---------+------------------+-----+---------+-------+  PTA       158                0.99  triphasic          +---------+------------------+-----+---------+-------+  DP        155                0.97  triphasic          +---------+------------------+-----+---------+-------+  Great Toe 121                0.76                     +---------+------------------+-----+---------+-------+  Summary: Right: Resting  right ankle-brachial index is within normal range. No evidence of significant right lower extremity arterial disease. The right toe-brachial index is normal. Left: Resting left ankle-brachial index is within normal range. No evidence of significant left lower extremity arterial disease. The left toe-brachial index is normal.  *See table(s) above for measurements and observations.  Electronically signed by Deitra Mayo MD on 01/14/2021 at 10:16:26 AM.    Final    No images are attached to the encounter.  Labs: Lab Results  Component Value Date   HGBA1C 6.4 11/29/2020   HGBA1C 6.0 (A) 09/13/2020   HGBA1C 6.0 09/13/2020   HGBA1C 6.0 09/13/2020   HGBA1C 6.0 09/13/2020   ESRSEDRATE 13 05/08/2020   ESRSEDRATE 42 (H) 08/12/2018   ESRSEDRATE 56 (H) 03/09/2018   CRP 1.6 (H) 02/04/2019   CRP 1.5 (H) 12/10/2018   CRP 2.1 (H) 12/09/2018   LABURIC 11.1 (H) 02/04/2019   LABURIC 10.5 (H) 09/13/2018   LABURIC 11.0 (H) 09/11/2018   REPTSTATUS 11/21/2020 FINAL 11/20/2020   GRAMSTAIN  05/27/2018    FEW WBC PRESENT, PREDOMINANTLY PMN MODERATE GRAM POSITIVE RODS RARE GRAM POSITIVE COCCI IN PAIRS    CULT MULTIPLE SPECIES PRESENT, SUGGEST RECOLLECTION (A) 11/20/2020   LABORGA CITROBACTER FREUNDII (A) 02/15/2019   LABORGA PSEUDOMONAS AERUGINOSA (A) 02/15/2019     Lab Results  Component Value Date   ALBUMIN 3.9 12/31/2020   ALBUMIN 3.5 12/10/2020   ALBUMIN 4.0 11/29/2020    Lab Results  Component Value Date   MG 2.4 11/20/2020   MG 2.3 11/19/2020   MG 2.3 11/18/2020   Lab Results  Component Value Date   VD25OH 47.2 06/02/2016   VD25OH 42 01/23/2011    No results found for: PREALBUMIN CBC EXTENDED Latest Ref Rng & Units 12/31/2020 12/17/2020 12/10/2020  WBC 4.0 - 10.5 K/uL 5.1 5.3 5.7  RBC 3.87 - 5.11 MIL/uL 3.99 3.84(L) 3.78(L)  HGB 12.0 - 15.0 g/dL 12.2 11.8(L) 11.4(L)  HCT 36.0 - 46.0 % 39.5 38.6 37.3  PLT 150 - 400 K/uL 176 110(L) 111(L)  NEUTROABS 1.7 - 7.7 K/uL 3.5 3.5 3.8   LYMPHSABS 0.7 - 4.0 K/uL 1.0 1.1 1.2  There is no height or weight on file to calculate BMI.  Orders:  No orders of the defined types were placed in this encounter.  No orders of the defined types were placed in this encounter.    Procedures: No procedures performed  Clinical Data: No additional findings.  ROS:  All other systems negative, except as noted in the HPI. Review of Systems  Objective: Vital Signs: There were no vitals taken for this visit.  Specialty Comments:  No specialty comments available.  PMFS History: Patient Active Problem List   Diagnosis Date Noted   Nocturnal hypoxemia due to obesity 01/03/2021   Thrombocytopenia ([place]) 12/12/2020   Frequent nosebleeds 11/29/2020   Acute on chronic respiratory failure with hypoxia and hypercapnia (HCC) 11/15/2020   Obesity hypoventilation syndrome ([place]) 11/15/2020   History of DVT (deep vein thrombosis) 11/15/2020   Encounter for chronic pain management 09/05/2020   Cellulitis, leg 05/08/2020   Leg DVT (deep venous thromboembolism), acute, bilateral ([place]) 05/08/2020   SOB (shortness of breath)    Gout    Morbid obesity ([place])    Pneumonia due to COVID-19 virus 12/10/2018   COVID-19 virus infection 12/06/2018   Right hip pain 11/09/2018   Osteomyelitis of ankle or foot, acute, left ([place]) 09/07/2018   Onychomycosis 05/19/2018   Idiopathic chronic venous hypertension of right lower extremity with ulcer and inflammation ([place]) 05/19/2018   Skin lumps 01/14/2018   Bilateral leg cramps 12/14/2017   Left shoulder pain 08/14/2016   Meralgia paresthetica 07/16/2016   Chronic left-sided low back pain with left-sided sciatica 06/02/2016   Osteopenia 01/11/2016   CKD (chronic kidney disease) stage 3, GFR 30-59 ml/min (HCC) 01/02/2016   Carpal tunnel syndrome 06/07/2015   Family history of colon cancer    Diabetes mellitus with neurological manifestations ([place]) 04/18/2015   Bilateral leg edema 04/11/2015    Abnormality of gait 01/03/2015   Hereditary and idiopathic peripheral neuropathy 01/03/2015   GERD (gastroesophageal reflux disease) 06/17/2014   Hx of colonic polyps 12/15/2012   Vitamin B12 deficiency 11/03/2012   Intrinsic asthma 03/23/2012   Chronic diastolic (congestive) heart failure ([place]) 02/20/2011   OSA (obstructive sleep apnea) 09/17/2010   CAD, NATIVE VESSEL 11/20/2008   Osteoarthritis of knees, bilateral 06/14/2008   Hyperlipidemia 05/10/2007   Essential hypertension 01/18/2007   Hypokalemia 04/30/2006   HX, PERSONAL, PEPTIC ULCER DISEASE 04/30/2006   Past Medical History:  Diagnosis Date   Anemia    Asthma    CAD (coronary artery disease)    Carpal tunnel syndrome    Cellulitis of both lower extremities 04/11/2015   CHF (congestive heart failure) ([place])    Chronic kidney disease    CKI- followed by [facility]   Colon polyp, hyperplastic 1025 & 8527   Complication of anesthesia 1999   svt with renal calculi surgery, no problems since   CTS (carpal tunnel syndrome)    bilateral   Diabetes mellitus    Eczema    GERD (gastroesophageal reflux disease)    History of kidney stones 1999   Hyperlipidemia    Hypertension    Leg ulcer ([place]) 04/24/2015   Right lateral leg No evidence of an infection Monitor closely Keep edema controlled    Leg ulcer ([place])    right lower   Meralgia paresthetica    Dr. [name]   Morbid obesity ([place])    Neuropathy    toes and legs   Osteoarthrosis, unspecified whether generalized or localized, lower leg    knee  PUD (peptic ulcer disease)    Shortness of breath dyspnea    with exertion   Sleep apnea    per progress note 02/25/2018   Type II or unspecified type diabetes mellitus without mention of complication, not stated as uncontrolled    Unspecified hereditary and idiopathic peripheral neuropathy    Urticaria    Vitamin B12 deficiency    Wears glasses    Wound cellulitis    right upper leg, healing well    Family History   Problem Relation Age of Onset   Colon cancer Mother    Prostate cancer Father    Colon cancer Father    Diabetes Maternal Aunt    Breast cancer Maternal Aunt    Diabetes Maternal Uncle    Diabetes Paternal Aunt    Stroke Paternal Aunt        > 3   Heart disease Paternal Aunt    Diabetes Paternal Uncle    Breast cancer Maternal Aunt         X 2   Breast cancer Cousin     Past Surgical History:  Procedure Laterality Date   ABDOMINAL HYSTERECTOMY     BREAST BIOPSY Right 07/08/2018   CARDIAC CATHETERIZATION  2002   non obstructive disease   colonoscopy with polypectomy  2007 & 2012    hyperplastic ;Dr Watt Climes   COLONOSCOPY WITH PROPOFOL N/A 06/04/2015   Procedure: COLONOSCOPY WITH PROPOFOL;  Surgeon: Jerene Bears, MD;  Location: [facility];  Service: Gastroenterology;  Laterality: N/A;   DEBRIDEMENT LEG Right 03/02/2018   WOUND VAC APPLIED   DEBRIDEMENT LEG Right 05/27/2018   RIGHT LOWER LEG DEBRIDEMENT,  SKIN GRAFT, VAC PLACEMENT    DILATION AND CURETTAGE OF UTERUS     multiple   HEMORRHOID SURGERY     HEMORROIDECTOMY     I & D EXTREMITY Right 03/02/2018   Procedure: RIGHT LEG DEBRIDEMENT AND PLACE VAC;  Surgeon: [name], MD;  Location: [facility] [place];  Service: Orthopedics;  Laterality: Right;   I & D EXTREMITY Right 03/04/2018   Procedure: REPEAT IRRIGATION AND DEBRIDEMENT RIGHT LEG, PLACE WOUND VAC;  Surgeon: [name], MD;  Location: [place];  Service: Orthopedics;  Laterality: Right;   I & D EXTREMITY Right 03/30/2018   Procedure: IRRIGATION AND DEBRIDEMENT RIGHT LEG, APPLY WOUND VAC;  Surgeon: [name], MD;  Location: [facility];  Service: Orthopedics;  Laterality: Right;   I & D EXTREMITY Right 05/27/2018   Procedure: RIGHT LOWER LEG DEBRIDEMENT,  SKIN GRAFT, VAC PLACEMENT;  Surgeon: [name], MD;  Location: [facility];  Service: Orthopedics;  Laterality: Right;  RIGHT LOWER LEG DEBRIDEMENT,  SKIN GRAFT, VAC PLACEMENT   renal calculi  12/1997   SVT with induction  of anesthesia   RIGHT HEART CATH N/A 11/19/2020   Procedure: RIGHT HEART CATH;  Surgeon: Jolaine Artist, MD;  Location: [place] [facility];  Service: Cardiovascular;  Laterality: N/A;   right knee arthroscopy     SKIN SPLIT GRAFT Right 03/04/2018   Procedure: POSSIBLE SPLIT THICKNESS SKIN GRAFT;  Surgeon: [name], MD;  Location: [facility] [place];  Service: Orthopedics;  Laterality: Right;   SKIN SPLIT GRAFT Right 04/01/2018   Procedure: REPEAT IRRIGATION AND DEBRIDEMENT RIGHT LEG, APPLY SPLIT THICKNESS SKIN GRAFT;  Surgeon: [name], MD;  Location: [facility];  Service: Orthopedics;  Laterality: Right;   TONSILLECTOMY AND ADENOIDECTOMY     Social History   Occupational History  Occupation: Disabled    Employer: RETIRED  Tobacco Use   Smoking status: Never   Smokeless tobacco: Never  Vaping Use   Vaping Use: Never used  Substance and Sexual Activity   Alcohol use: No    Alcohol/week: 0.0 standard drinks   Drug use: No   Sexual activity: Not Currently

## 2021-01-14 NOTE — Progress Notes (Signed)
ABI study completed.   Please see CV Proc for preliminary results.   Darlin Coco, RDMS, RVT

## 2021-01-15 ENCOUNTER — Encounter: Payer: Self-pay | Admitting: Radiology

## 2021-01-15 DIAGNOSIS — I130 Hypertensive heart and chronic kidney disease with heart failure and stage 1 through stage 4 chronic kidney disease, or unspecified chronic kidney disease: Secondary | ICD-10-CM | POA: Diagnosis not present

## 2021-01-15 DIAGNOSIS — E1122 Type 2 diabetes mellitus with diabetic chronic kidney disease: Secondary | ICD-10-CM | POA: Diagnosis not present

## 2021-01-15 DIAGNOSIS — N1832 Chronic kidney disease, stage 3b: Secondary | ICD-10-CM | POA: Diagnosis not present

## 2021-01-15 DIAGNOSIS — D631 Anemia in chronic kidney disease: Secondary | ICD-10-CM | POA: Diagnosis not present

## 2021-01-15 DIAGNOSIS — I5033 Acute on chronic diastolic (congestive) heart failure: Secondary | ICD-10-CM | POA: Diagnosis not present

## 2021-01-15 DIAGNOSIS — J9621 Acute and chronic respiratory failure with hypoxia: Secondary | ICD-10-CM | POA: Diagnosis not present

## 2021-01-15 DIAGNOSIS — J9622 Acute and chronic respiratory failure with hypercapnia: Secondary | ICD-10-CM | POA: Diagnosis not present

## 2021-01-15 DIAGNOSIS — I2510 Atherosclerotic heart disease of native coronary artery without angina pectoris: Secondary | ICD-10-CM | POA: Diagnosis not present

## 2021-01-15 DIAGNOSIS — J45909 Unspecified asthma, uncomplicated: Secondary | ICD-10-CM | POA: Diagnosis not present

## 2021-01-16 ENCOUNTER — Encounter: Payer: Self-pay | Admitting: Internal Medicine

## 2021-01-16 ENCOUNTER — Telehealth: Payer: Self-pay | Admitting: Internal Medicine

## 2021-01-16 MED ORDER — OXCARBAZEPINE 300 MG PO TABS: 300.0000 mg | ORAL_TABLET | Freq: Two times a day (BID) | ORAL | 1 refills | Status: DC

## 2021-01-16 NOTE — Progress Notes (Signed)
Subjective:    Patient ID: April [name], female    DOB: 03/27/1950, 70 y.o.   MRN: [id number]  This visit occurred during the SARS-CoV-2 public health emergency.  Safety protocols were in place, including screening questions prior to the visit, additional usage of staff PPE, and extensive cleaning of exam room while observing appropriate contact time as indicated for disinfecting solutions.     HPI The patient is here for follow up of their chronic medical problems, including htn, b/l LE DVT on eliquis, HRpEF, DM, GERD, chronic knee pain ,neuropathy, h/o gout, CKD, hyperlipidemia, B12 def, leg edema   Her legs are wrapped.    Hands shaking.  -her grandfather had this.   She notices it when she is writing.  She denies it when eating.  It is very mild and not all the time.    Medications and allergies reviewed with patient and updated if appropriate.  Patient Active Problem List   Diagnosis Date Noted   Nocturnal hypoxemia due to obesity 01/03/2021   Thrombocytopenia ([place]) 12/12/2020   Frequent nosebleeds 11/29/2020   Obesity hypoventilation syndrome ([place]) 11/15/2020   History of DVT (deep vein thrombosis) 11/15/2020   Encounter for chronic pain management 09/05/2020   Cellulitis, leg 05/08/2020   Leg DVT (deep venous thromboembolism), acute, bilateral ([place]) 05/08/2020   SOB (shortness of breath)    Gout    Morbid obesity ([place])    Pneumonia due to COVID-19 virus 12/10/2018   COVID-19 virus infection 12/06/2018   Right hip pain 11/09/2018   Osteomyelitis of ankle or foot, acute, left ([place]) 09/07/2018   Onychomycosis 05/19/2018   Idiopathic chronic venous hypertension of right lower extremity with ulcer and inflammation ([place]) 05/19/2018   Skin lumps 01/14/2018   Bilateral leg cramps 12/14/2017   Left shoulder pain 08/14/2016   Meralgia paresthetica 07/16/2016   Chronic left-sided low back pain with left-sided sciatica 06/02/2016   Osteopenia 01/11/2016   CKD  (chronic kidney disease) stage 3, GFR 30-59 ml/min (HCC) 01/02/2016   Carpal tunnel syndrome 06/07/2015   Family history of colon cancer    Diabetes mellitus with neurological manifestations ([place]) 04/18/2015   Bilateral leg edema 04/11/2015   Abnormality of gait 01/03/2015   Hereditary and idiopathic peripheral neuropathy 01/03/2015   GERD (gastroesophageal reflux disease) 06/17/2014   Hx of colonic polyps 12/15/2012   Vitamin B12 deficiency 11/03/2012   Intrinsic asthma 03/23/2012   Chronic diastolic (congestive) heart failure ([place]) 02/20/2011   OSA (obstructive sleep apnea) 09/17/2010   CAD, NATIVE VESSEL 11/20/2008   Osteoarthritis of knees, bilateral 06/14/2008   Hyperlipidemia 05/10/2007   Essential hypertension 01/18/2007   Hypokalemia 04/30/2006   HX, PERSONAL, PEPTIC ULCER DISEASE 04/30/2006    Current Outpatient Medications on File Prior to Visit  Medication Sig Dispense Refill   acetaminophen (TYLENOL) 325 MG tablet Take 2 tablets (650 mg total) by mouth every 6 (six) hours as needed for moderate pain (or Fever >/= 101). 20 tablet 0   Alcohol Swabs (DROPSAFE ALCOHOL PREP) 70 % PADS USE TOPICALLY AS DIRECTED 300 each 3   allopurinol (ZYLOPRIM) 100 MG tablet Take 2 tablets (200 mg total) by mouth daily. 180 tablet 1   apixaban (ELIQUIS) 5 MG TABS tablet Take 1 tablet (5 mg total) by mouth 2 (two) times daily. 60 tablet 1   atorvastatin (LIPITOR) 40 MG tablet Take 1 tablet (40 mg total) by mouth at bedtime. 90 tablet 2   Blood Glucose Calibration (TRUE METRIX  LEVEL 1) Low SOLN UAD  E11.9 1 each 3   Blood Glucose Monitoring Suppl (TRUE METRIX METER) DEVI True Metrix Level 1 solution     Budesonide (PULMICORT FLEXHALER) 90 MCG/ACT inhaler Inhale 1 puff into the lungs 2 (two) times daily as needed for shortness of breath or wheezing.     budesonide-formoterol (SYMBICORT) 160-4.5 MCG/ACT inhaler one - two inhalations every 12 hours; gargle and spit after use 1 Inhaler 4   calcium  carbonate (TUMS - DOSED IN MG ELEMENTAL CALCIUM) 500 MG chewable tablet Chew 2 tablets by mouth daily as needed for indigestion or heartburn.     cefdinir (OMNICEF) 300 MG capsule cefdinir 300 mg capsule     cholecalciferol (VITAMIN D3) 25 MCG (1000 UNIT) tablet Take 1 tablet (1,000 Units total) by mouth daily. 90 tablet 1   COLCHICINE PO Take 1.2 mg by mouth as needed. 2 tablets     cyanocobalamin (,VITAMIN B-12,) 1000 MCG/ML injection INJECT 1ML INTO MUSCLE EVERY 30 DAYS 10 mL 5   diclofenac sodium (VOLTAREN) 1 % GEL Apply 2 g topically 4 (four) times daily. 100 g 11   eszopiclone (LUNESTA) 1 MG TABS tablet eszopiclone 1 mg tablet     famotidine (PEPCID) 20 MG tablet Take 1 tablet (20 mg total) by mouth 2 (two) times daily. 180 tablet 3   FEROSUL 325 (65 Fe) MG tablet TAKE 1 TABLET BY MOUTH EVERY MONDAY, WEDNESDAY, AND FRIDAY 60 tablet 0   folic acid (FOLVITE) 932 MCG tablet Take 1 tablet (400 mcg total) by mouth at bedtime. Take 400 mcg by mouth at bedtime. (Patient taking differently: Take 400 mcg by mouth at bedtime. Take 400 mcg by mouth at bedtime.) 90 tablet 1   gabapentin (NEURONTIN) 100 MG capsule TAKE 1 CAPSULE(100 MG) BY MOUTH THREE TIMES DAILY 90 capsule 1   hydrOXYzine (ATARAX/VISTARIL) 25 MG tablet Take 1 tablet (25 mg total) by mouth 3 (three) times daily as needed for itching. 30 tablet 0   montelukast (SINGULAIR) 10 MG tablet TAKE 1 TABLET(10 MG) BY MOUTH AT BEDTIME 30 tablet 5   nebivolol (BYSTOLIC) 5 MG tablet Take 1 tablet (5 mg total) by mouth daily. 30 tablet 3   Oxcarbazepine (TRILEPTAL) 300 MG tablet Take 1 tablet (300 mg total) by mouth 2 (two) times daily. 180 tablet 1   oxyCODONE (OXY IR/ROXICODONE) 5 MG immediate release tablet oxycodone 5 mg tablet     PEG-KCl-NaCl-NaSulf-Na Asc-C (PLENVU) 140 g SOLR Take 1 kit by mouth as directed. Use coupon: BIN: [id number] PNC: CNRX Group: [id number] ID: [id number] 1 each 0   polyethylene glycol (MIRALAX / GLYCOLAX) 17 g packet Take 17  g by mouth as needed.     potassium chloride SA (KLOR-CON) 20 MEQ tablet Take 1 tablet (20 mEq total) by mouth daily. 10 tablet 0   senna-docusate (SENOKOT-S) 8.6-50 MG tablet Take 1 tablet by mouth at bedtime as needed for mild constipation. 30 tablet 0   spironolactone (ALDACTONE) 25 MG tablet TAKE 1 TABLET(25 MG) BY MOUTH DAILY 90 tablet 0   Torsemide 40 MG TABS Take 80 mg by mouth 2 (two) times daily. 360 tablet 2   VENTOLIN HFA 108 (90 Base) MCG/ACT inhaler Inhale 2 puffs into the lungs every 4 (four) hours as needed for wheezing.     No current facility-administered medications on file prior to visit.    Past Medical History:  Diagnosis Date   Anemia    Asthma    CAD (coronary artery  disease)    Carpal tunnel syndrome    Cellulitis of both lower extremities 04/11/2015   CHF (congestive heart failure) (HCC)    Chronic kidney disease    CKI- followed by [facility]   Colon polyp, hyperplastic 6967 & [id number]   Complication of anesthesia 1999   svt with renal calculi surgery, no problems since   CTS (carpal tunnel syndrome)    bilateral   Diabetes mellitus    Eczema    GERD (gastroesophageal reflux disease)    History of kidney stones 1999   Hyperlipidemia    Hypertension    Leg ulcer ([place]) 04/24/2015   Right lateral leg No evidence of an infection Monitor closely Keep edema controlled    Leg ulcer ([place])    right lower   Meralgia paresthetica    Dr. [name]   Morbid obesity ([place])    Neuropathy    toes and legs   Osteoarthrosis, unspecified whether generalized or localized, lower leg    knee   PUD (peptic ulcer disease)    Shortness of breath dyspnea    with exertion   Sleep apnea    per progress note 02/25/2018   Type II or unspecified type diabetes mellitus without mention of complication, not stated as uncontrolled    Unspecified hereditary and idiopathic peripheral neuropathy    Urticaria    Vitamin B12 deficiency    Wears glasses    Wound cellulitis    right  upper leg, healing well    Past Surgical History:  Procedure Laterality Date   ABDOMINAL HYSTERECTOMY     BREAST BIOPSY Right 07/08/2018   CARDIAC CATHETERIZATION  2002   non obstructive disease   colonoscopy with polypectomy  2007 & 2012    hyperplastic ;Dr Watt Climes   COLONOSCOPY WITH PROPOFOL N/A 06/04/2015   Procedure: COLONOSCOPY WITH PROPOFOL;  Surgeon: Jerene Bears, MD;  Location: [facility];  Service: Gastroenterology;  Laterality: N/A;   DEBRIDEMENT LEG Right 03/02/2018   WOUND VAC APPLIED   DEBRIDEMENT LEG Right 05/27/2018   RIGHT LOWER LEG DEBRIDEMENT,  SKIN GRAFT, VAC PLACEMENT    DILATION AND CURETTAGE OF UTERUS     multiple   HEMORRHOID SURGERY     HEMORROIDECTOMY     I & D EXTREMITY Right 03/02/2018   Procedure: RIGHT LEG DEBRIDEMENT AND PLACE VAC;  Surgeon: [name], MD;  Location: [facility];  Service: Orthopedics;  Laterality: Right;   I & D EXTREMITY Right 03/04/2018   Procedure: REPEAT IRRIGATION AND DEBRIDEMENT RIGHT LEG, PLACE WOUND VAC;  Surgeon: [name], MD;  Location: [facility];  Service: Orthopedics;  Laterality: Right;   I & D EXTREMITY Right 03/30/2018   Procedure: IRRIGATION AND DEBRIDEMENT RIGHT LEG, APPLY WOUND VAC;  Surgeon: [name], MD;  Location: [facility];  Service: Orthopedics;  Laterality: Right;   I & D EXTREMITY Right 05/27/2018   Procedure: RIGHT LOWER LEG DEBRIDEMENT,  SKIN GRAFT, VAC PLACEMENT;  Surgeon: [name], MD;  Location: [facility];  Service: Orthopedics;  Laterality: Right;  RIGHT LOWER LEG DEBRIDEMENT,  SKIN GRAFT, VAC PLACEMENT   renal calculi  12/1997   SVT with induction of anesthesia   RIGHT HEART CATH N/A 11/19/2020   Procedure: RIGHT HEART CATH;  Surgeon: Jolaine Artist, MD;  Location: [facility];  Service: Cardiovascular;  Laterality: N/A;   right knee arthroscopy     SKIN SPLIT GRAFT Right 03/04/2018   Procedure: POSSIBLE SPLIT THICKNESS SKIN  GRAFT;  Surgeon: [name], MD;  Location: [facility];   Service: Orthopedics;  Laterality: Right;   SKIN SPLIT GRAFT Right 04/01/2018   Procedure: REPEAT IRRIGATION AND DEBRIDEMENT RIGHT LEG, APPLY SPLIT THICKNESS SKIN GRAFT;  Surgeon: [name], MD;  Location: [facility];  Service: Orthopedics;  Laterality: Right;   TONSILLECTOMY AND ADENOIDECTOMY      Social History   Socioeconomic History   Marital status: Married    Spouse name: Dwight   Number of children: 1   Years of education: BS   Highest education level: Not on file  Occupational History   Occupation: Disabled    Employer: RETIRED  Tobacco Use   Smoking status: Never   Smokeless tobacco: Never  Vaping Use   Vaping Use: Never used  Substance and Sexual Activity   Alcohol use: No    Alcohol/week: 0.0 standard drinks   Drug use: No   Sexual activity: Not Currently  Other Topics Concern   Not on file  Social History Narrative   Patient lives at home with her husband [name] Patient is retired and has a Conservation officer, nature.    Caffeine - some times.   Right handed.   Social Determinants of Health   Financial Resource Strain: Not on file  Food Insecurity: Not on file  Transportation Needs: Not on file  Physical Activity: Not on file  Stress: Not on file  Social Connections: Not on file    Family History  Problem Relation Age of Onset   Colon cancer Mother    Prostate cancer Father    Colon cancer Father    Diabetes Maternal Aunt    Breast cancer Maternal Aunt    Diabetes Maternal Uncle    Diabetes Paternal Aunt    Stroke Paternal Aunt        > 92   Heart disease Paternal Aunt    Diabetes Paternal Uncle    Breast cancer Maternal Aunt         X 2   Breast cancer Cousin     Review of Systems  Constitutional:  Negative for fever.  Respiratory:  Positive for shortness of breath. Negative for cough and wheezing.   Cardiovascular:  Positive for leg swelling. Negative for chest pain and palpitations.  Neurological:  Positive for light-headedness. Negative  for headaches.      Objective:   Vitals:   01/17/21 1309  BP: 122/78  Pulse: 80  Temp: 98.3 F (36.8 C)  SpO2: 93%   BP Readings from Last 3 Encounters:  01/17/21 122/78  01/10/21 116/70  01/03/21 120/80   Wt Readings from Last 3 Encounters:  01/14/21 (!) 362 lb (164.2 kg)  01/10/21 (!) 362 lb (164.2 kg)  01/03/21 (!) 351 lb (159.2 kg)   Body mass index is 62.14 kg/m.   Physical Exam    Constitutional: Appears well-developed and well-nourished. No distress.  HENT:  Head: Normocephalic and atraumatic.  Neck: Neck supple. No tracheal deviation present. No thyromegaly present.  No cervical lymphadenopathy Cardiovascular: Normal rate, regular rhythm and normal heart sounds.   No murmur heard. No carotid bruit .  Bilateral lower extremity chronic edema-legs are currently wrapped Pulmonary/Chest: Effort normal and breath sounds normal. No respiratory distress. No has no wheezes. No rales.  Skin: Skin is warm and dry. Not diaphoretic.  Psychiatric: Normal mood and affect. Behavior is normal.      Assessment & Plan:    See Problem List for Assessment and Plan  of chronic medical problems.

## 2021-01-16 NOTE — Telephone Encounter (Signed)
1.Medication Requested: Oxcarbazepine (TRILEPTAL) 300 MG tablet  2. Pharmacy (Name, Street, [place]): [facility] [phone] - [place], [place] - [address] AT [place]  3. On Med List: yes  4. Last Visit with PCP: 11-29-2020  5. Next visit date with PCP: 01-17-2021   Agent: Please be advised that RX refills may take up to 3 business days. We ask that you follow-up with your pharmacy.

## 2021-01-17 ENCOUNTER — Ambulatory Visit: Payer: Medicare PPO | Admitting: Internal Medicine

## 2021-01-17 ENCOUNTER — Telehealth: Payer: Self-pay

## 2021-01-17 ENCOUNTER — Other Ambulatory Visit: Payer: Self-pay | Admitting: Internal Medicine

## 2021-01-17 ENCOUNTER — Ambulatory Visit: Payer: Medicare PPO | Admitting: Family

## 2021-01-17 ENCOUNTER — Other Ambulatory Visit: Payer: Self-pay

## 2021-01-17 VITALS — BP 122/78 | HR 80 | Temp 98.3°F | Ht 64.0 in

## 2021-01-17 DIAGNOSIS — E538 Deficiency of other specified B group vitamins: Secondary | ICD-10-CM

## 2021-01-17 DIAGNOSIS — K219 Gastro-esophageal reflux disease without esophagitis: Secondary | ICD-10-CM | POA: Diagnosis not present

## 2021-01-17 DIAGNOSIS — I5032 Chronic diastolic (congestive) heart failure: Secondary | ICD-10-CM

## 2021-01-17 DIAGNOSIS — E782 Mixed hyperlipidemia: Secondary | ICD-10-CM | POA: Diagnosis not present

## 2021-01-17 DIAGNOSIS — E1140 Type 2 diabetes mellitus with diabetic neuropathy, unspecified: Secondary | ICD-10-CM | POA: Diagnosis not present

## 2021-01-17 DIAGNOSIS — M8588 Other specified disorders of bone density and structure, other site: Secondary | ICD-10-CM

## 2021-01-17 DIAGNOSIS — N1832 Chronic kidney disease, stage 3b: Secondary | ICD-10-CM

## 2021-01-17 DIAGNOSIS — M109 Gout, unspecified: Secondary | ICD-10-CM | POA: Diagnosis not present

## 2021-01-17 DIAGNOSIS — I10 Essential (primary) hypertension: Secondary | ICD-10-CM | POA: Diagnosis not present

## 2021-01-17 DIAGNOSIS — G609 Hereditary and idiopathic neuropathy, unspecified: Secondary | ICD-10-CM

## 2021-01-17 DIAGNOSIS — R600 Localized edema: Secondary | ICD-10-CM

## 2021-01-17 DIAGNOSIS — E785 Hyperlipidemia, unspecified: Secondary | ICD-10-CM

## 2021-01-17 DIAGNOSIS — M170 Bilateral primary osteoarthritis of knee: Secondary | ICD-10-CM

## 2021-01-17 MED ORDER — OZEMPIC (0.25 OR 0.5 MG/DOSE) 2 MG/1.5ML ~~LOC~~ SOPN: PEN_INJECTOR | SUBCUTANEOUS | 5 refills | Status: DC

## 2021-01-17 NOTE — Assessment & Plan Note (Signed)
Chronic Stable, controlled Appears to be euvolemic Following with cardiology

## 2021-01-17 NOTE — Assessment & Plan Note (Signed)
Chronic Fairly controlled Continue torsemide 80 mg twice daily

## 2021-01-17 NOTE — Assessment & Plan Note (Signed)
Chronic Has chronic pain-is controlled as possible Continue gabapentin 100 mg 3 times daily and Trileptal 300 mg twice daily Continue oxycodone 5 mg every 6 hours as needed, which she takes only when pain is severe

## 2021-01-17 NOTE — Assessment & Plan Note (Addendum)
Chronic Lab Results  Component Value Date   HGBA1C 6.4 11/29/2020   Sugars have been controlled Currently diet controlled Weight loss has been a big issue that she has not been successful with on her own-we will start Ozempic 0.25 mg weekly-increase to 0.5 mg weekly after 4 weeks and if tolerated 1 mg weekly Could consider Mounjaro in the future, but would want to see what Dr. [name] thought first

## 2021-01-17 NOTE — Assessment & Plan Note (Addendum)
Chronic healthy diet encouraged Continue atorvastatin 40 mg daily

## 2021-01-17 NOTE — Patient Instructions (Addendum)
° ° ° °  Medications changes include :   start ozempic 0.25 mg injection weekly x 4 weeks then increase to 0.5 mg weekly  Your prescription(s) have been submitted to your pharmacy. Please take as directed and contact our office if you believe you are having problem(s) with the medication(s).   Please followup in 3 months

## 2021-01-17 NOTE — Telephone Encounter (Signed)
I called and sw April [name] with [facility]. They called pt to try and make an appt today for nurse eval to order supplied and pt declined. She declined an appt on Saturday with them as well. They will not have enough time to order dressings for pt and now the pt must come to our office next week for dressing change. I called and advised the pt of this and she will come in on 01/21/21 the [facility] will assess for supplies on Monday and will pick up dressing changes after that.

## 2021-01-17 NOTE — Assessment & Plan Note (Signed)
Chronic Severe Following with orthopedics and getting periodic injections Continue oxycodone 5 mg every 6 hours as needed for severe pain

## 2021-01-17 NOTE — Assessment & Plan Note (Signed)
Chronic Stable Following with Dr. [name] at [facility]

## 2021-01-17 NOTE — Assessment & Plan Note (Signed)
Chronic DEXA due-ordered

## 2021-01-17 NOTE — Assessment & Plan Note (Addendum)
Chronic Continue monthly B12 injections at home

## 2021-01-17 NOTE — Assessment & Plan Note (Signed)
Chronic GERD controlled Continue famotidine 20 mg twice daily 

## 2021-01-17 NOTE — Assessment & Plan Note (Signed)
Chronic Continue allopurinol 200 mg daily

## 2021-01-17 NOTE — Assessment & Plan Note (Addendum)
Chronic Blood pressure well controlled Continue Bystolic 5 mg daily, spironolactone 25 mg daily, torsemide 80 mg twice daily

## 2021-01-19 DIAGNOSIS — J9622 Acute and chronic respiratory failure with hypercapnia: Secondary | ICD-10-CM | POA: Diagnosis not present

## 2021-01-19 DIAGNOSIS — I2510 Atherosclerotic heart disease of native coronary artery without angina pectoris: Secondary | ICD-10-CM | POA: Diagnosis not present

## 2021-01-19 DIAGNOSIS — J9621 Acute and chronic respiratory failure with hypoxia: Secondary | ICD-10-CM | POA: Diagnosis not present

## 2021-01-19 DIAGNOSIS — J45909 Unspecified asthma, uncomplicated: Secondary | ICD-10-CM | POA: Diagnosis not present

## 2021-01-19 DIAGNOSIS — N1832 Chronic kidney disease, stage 3b: Secondary | ICD-10-CM | POA: Diagnosis not present

## 2021-01-19 DIAGNOSIS — I130 Hypertensive heart and chronic kidney disease with heart failure and stage 1 through stage 4 chronic kidney disease, or unspecified chronic kidney disease: Secondary | ICD-10-CM | POA: Diagnosis not present

## 2021-01-19 DIAGNOSIS — E1122 Type 2 diabetes mellitus with diabetic chronic kidney disease: Secondary | ICD-10-CM | POA: Diagnosis not present

## 2021-01-19 DIAGNOSIS — D631 Anemia in chronic kidney disease: Secondary | ICD-10-CM | POA: Diagnosis not present

## 2021-01-19 DIAGNOSIS — I5033 Acute on chronic diastolic (congestive) heart failure: Secondary | ICD-10-CM | POA: Diagnosis not present

## 2021-01-20 ENCOUNTER — Telehealth: Payer: Self-pay | Admitting: Internal Medicine

## 2021-01-20 ENCOUNTER — Ambulatory Visit: Payer: Medicare PPO

## 2021-01-20 NOTE — Telephone Encounter (Signed)
Okay to continue skilled nursing

## 2021-01-20 NOTE — Telephone Encounter (Signed)
Willis Modena called from [facility] stating that she need orders to continue skilled nursing for the patient. Pam can be reached at [phone].

## 2021-01-21 ENCOUNTER — Encounter: Payer: Self-pay | Admitting: Orthopedic Surgery

## 2021-01-21 ENCOUNTER — Ambulatory Visit (INDEPENDENT_AMBULATORY_CARE_PROVIDER_SITE_OTHER): Payer: Medicare PPO | Admitting: Orthopedic Surgery

## 2021-01-21 ENCOUNTER — Ambulatory Visit: Payer: Medicare PPO | Admitting: Orthopedic Surgery

## 2021-01-21 DIAGNOSIS — I872 Venous insufficiency (chronic) (peripheral): Secondary | ICD-10-CM

## 2021-01-21 NOTE — Telephone Encounter (Signed)
Pt ended up cancelling 11/22 appt with [facility] and saw Roxan Diesel, NP 12/2. Closing encounter.

## 2021-01-21 NOTE — Progress Notes (Signed)
Office Visit Note   Patient: April [name]           Date of Birth: 26-Feb-1950           MRN: [id number] Visit Date: 01/21/2021              Requested by: [name], MD [place],  [place] [address] PCP: [name], MD  Chief Complaint  Patient presents with   Right Leg - Follow-up   Left Leg - Follow-up      HPI: Patient is a 70 year old woman with bilateral venous stasis insufficiency ulcers.  Patient has undergone serial wraps and the ulcers are essentially healed at this time however she still has persistent swelling and needs continued compression wraps.  Assessment & Plan: Visit Diagnoses:  1. Venous stasis dermatitis of both lower extremities     Plan: Patient to be set up with home health nursing for weekly compression wraps.  Legs wrapped today bilaterally.  Follow-Up Instructions: Return in about 2 months (around 03/24/2021).   Ortho Exam  Patient is alert, oriented, no adenopathy, well-dressed, normal affect, normal respiratory effort. Examination of the distal third of her leg has decreased swelling significantly with no ulcers there is still brawny skin color changes more proximally there is no open ulcers but more swelling proximally.  Patient just had ankle-brachial indices obtained and she has triphasic flow at the ankle with good toe pressures of the great toe bilaterally.  Imaging: No results found. No images are attached to the encounter.  Labs: Lab Results  Component Value Date   HGBA1C 6.4 11/29/2020   HGBA1C 6.0 (A) 09/13/2020   HGBA1C 6.0 09/13/2020   HGBA1C 6.0 09/13/2020   HGBA1C 6.0 09/13/2020   ESRSEDRATE 13 05/08/2020   ESRSEDRATE 42 (H) 08/12/2018   ESRSEDRATE 56 (H) 03/09/2018   CRP 1.6 (H) 02/04/2019   CRP 1.5 (H) 12/10/2018   CRP 2.1 (H) 12/09/2018   LABURIC 11.1 (H) 02/04/2019   LABURIC 10.5 (H) 09/13/2018   LABURIC 11.0 (H) 09/11/2018   REPTSTATUS 11/21/2020 FINAL 11/20/2020   GRAMSTAIN  05/27/2018     FEW WBC PRESENT, PREDOMINANTLY PMN MODERATE GRAM POSITIVE RODS RARE GRAM POSITIVE COCCI IN PAIRS    CULT MULTIPLE SPECIES PRESENT, SUGGEST RECOLLECTION (A) 11/20/2020   LABORGA CITROBACTER FREUNDII (A) 02/15/2019   LABORGA PSEUDOMONAS AERUGINOSA (A) 02/15/2019     Lab Results  Component Value Date   ALBUMIN 3.9 12/31/2020   ALBUMIN 3.5 12/10/2020   ALBUMIN 4.0 11/29/2020    Lab Results  Component Value Date   MG 2.4 11/20/2020   MG 2.3 11/19/2020   MG 2.3 11/18/2020   Lab Results  Component Value Date   VD25OH 47.2 06/02/2016   VD25OH 42 01/23/2011    No results found for: PREALBUMIN CBC EXTENDED Latest Ref Rng & Units 12/31/2020 12/17/2020 12/10/2020  WBC 4.0 - 10.5 K/uL 5.1 5.3 5.7  RBC 3.87 - 5.11 MIL/uL 3.99 3.84(L) 3.78(L)  HGB 12.0 - 15.0 g/dL 12.2 11.8(L) 11.4(L)  HCT 36.0 - 46.0 % 39.5 38.6 37.3  PLT 150 - 400 K/uL 176 110(L) 111(L)  NEUTROABS 1.7 - 7.7 K/uL 3.5 3.5 3.8  LYMPHSABS 0.7 - 4.0 K/uL 1.0 1.1 1.2     There is no height or weight on file to calculate BMI.  Orders:  No orders of the defined types were placed in this encounter.  No orders of the defined types were placed in this encounter.  Procedures: No procedures performed  Clinical Data: No additional findings.  ROS:  All other systems negative, except as noted in the HPI. Review of Systems  Objective: Vital Signs: There were no vitals taken for this visit.  Specialty Comments:  No specialty comments available.  PMFS History: Patient Active Problem List   Diagnosis Date Noted   Nocturnal hypoxemia due to obesity 01/03/2021   Thrombocytopenia ([place]) 12/12/2020   Frequent nosebleeds 11/29/2020   Obesity hypoventilation syndrome ([place]) 11/15/2020   History of DVT (deep vein thrombosis) 11/15/2020   Encounter for chronic pain management 09/05/2020   Cellulitis, leg 05/08/2020   Leg DVT (deep venous thromboembolism), acute, bilateral ([place]) 05/08/2020   SOB (shortness of  breath)    Gout    Morbid obesity ([place])    Pneumonia due to COVID-19 virus 12/10/2018   COVID-19 virus infection 12/06/2018   Right hip pain 11/09/2018   Osteomyelitis of ankle or foot, acute, left ([place]) 09/07/2018   Onychomycosis 05/19/2018   Idiopathic chronic venous hypertension of right lower extremity with ulcer and inflammation ([place]) 05/19/2018   Skin lumps 01/14/2018   Bilateral leg cramps 12/14/2017   Left shoulder pain 08/14/2016   Meralgia paresthetica 07/16/2016   Chronic left-sided low back pain with left-sided sciatica 06/02/2016   Osteopenia 01/11/2016   CKD (chronic kidney disease) stage 3, GFR 30-59 ml/min (HCC) 01/02/2016   Carpal tunnel syndrome 06/07/2015   Family history of colon cancer    Diabetes mellitus with neurological manifestations ([place]) 04/18/2015   Bilateral leg edema 04/11/2015   Abnormality of gait 01/03/2015   Hereditary and idiopathic peripheral neuropathy 01/03/2015   GERD (gastroesophageal reflux disease) 06/17/2014   Hx of colonic polyps 12/15/2012   Vitamin B12 deficiency 11/03/2012   Intrinsic asthma 03/23/2012   Chronic diastolic (congestive) heart failure ([place]) 02/20/2011   OSA (obstructive sleep apnea) 09/17/2010   CAD, NATIVE VESSEL 11/20/2008   Osteoarthritis of knees, bilateral 06/14/2008   Hyperlipidemia 05/10/2007   Essential hypertension 01/18/2007   Hypokalemia 04/30/2006   HX, PERSONAL, PEPTIC ULCER DISEASE 04/30/2006   Past Medical History:  Diagnosis Date   Anemia    Asthma    CAD (coronary artery disease)    Carpal tunnel syndrome    Cellulitis of both lower extremities 04/11/2015   CHF (congestive heart failure) ([place])    Chronic kidney disease    CKI- followed by [facility]   Colon polyp, hyperplastic 9741 & 6384   Complication of anesthesia 1999   svt with renal calculi surgery, no problems since   CTS (carpal tunnel syndrome)    bilateral   Diabetes mellitus    Eczema    GERD (gastroesophageal reflux  disease)    History of kidney stones 1999   Hyperlipidemia    Hypertension    Leg ulcer ([place]) 04/24/2015   Right lateral leg No evidence of an infection Monitor closely Keep edema controlled    Leg ulcer ([place])    right lower   Meralgia paresthetica    Dr. [name]   Morbid obesity ([place])    Neuropathy    toes and legs   Osteoarthrosis, unspecified whether generalized or localized, lower leg    knee   PUD (peptic ulcer disease)    Shortness of breath dyspnea    with exertion   Sleep apnea    per progress note 02/25/2018   Type II or unspecified type diabetes mellitus without mention of complication, not stated as uncontrolled    Unspecified hereditary and idiopathic  peripheral neuropathy    Urticaria    Vitamin B12 deficiency    Wears glasses    Wound cellulitis    right upper leg, healing well    Family History  Problem Relation Age of Onset   Colon cancer Mother    Prostate cancer Father    Colon cancer Father    Diabetes Maternal Aunt    Breast cancer Maternal Aunt    Diabetes Maternal Uncle    Diabetes Paternal Aunt    Stroke Paternal Aunt        > 72   Heart disease Paternal Aunt    Diabetes Paternal Uncle    Breast cancer Maternal Aunt         X 2   Breast cancer Cousin     Past Surgical History:  Procedure Laterality Date   ABDOMINAL HYSTERECTOMY     BREAST BIOPSY Right 07/08/2018   CARDIAC CATHETERIZATION  2002   non obstructive disease   colonoscopy with polypectomy  2007 & 2012    hyperplastic ;Dr Watt Climes   COLONOSCOPY WITH PROPOFOL N/A 06/04/2015   Procedure: COLONOSCOPY WITH PROPOFOL;  Surgeon: Jerene Bears, MD;  Location: [facility];  Service: Gastroenterology;  Laterality: N/A;   DEBRIDEMENT LEG Right 03/02/2018   WOUND VAC APPLIED   DEBRIDEMENT LEG Right 05/27/2018   RIGHT LOWER LEG DEBRIDEMENT,  SKIN GRAFT, VAC PLACEMENT    DILATION AND CURETTAGE OF UTERUS     multiple   HEMORRHOID SURGERY     HEMORROIDECTOMY     I & D EXTREMITY Right 03/02/2018    Procedure: RIGHT LEG DEBRIDEMENT AND PLACE VAC;  Surgeon: [name], MD;  Location: [facility];  Service: Orthopedics;  Laterality: Right;   I & D EXTREMITY Right 03/04/2018   Procedure: REPEAT IRRIGATION AND DEBRIDEMENT RIGHT LEG, PLACE WOUND VAC;  Surgeon: [name], MD;  Location: [facility];  Service: Orthopedics;  Laterality: Right;   I & D EXTREMITY Right 03/30/2018   Procedure: IRRIGATION AND DEBRIDEMENT RIGHT LEG, APPLY WOUND VAC;  Surgeon: [name], MD;  Location: [facility];  Service: Orthopedics;  Laterality: Right;   I & D EXTREMITY Right 05/27/2018   Procedure: RIGHT LOWER LEG DEBRIDEMENT,  SKIN GRAFT, VAC PLACEMENT;  Surgeon: [name], MD;  Location: [facility];  Service: Orthopedics;  Laterality: Right;  RIGHT LOWER LEG DEBRIDEMENT,  SKIN GRAFT, VAC PLACEMENT   renal calculi  12/1997   SVT with induction of anesthesia   RIGHT HEART CATH N/A 11/19/2020   Procedure: RIGHT HEART CATH;  Surgeon: Jolaine Artist, MD;  Location: [facility];  Service: Cardiovascular;  Laterality: N/A;   right knee arthroscopy     SKIN SPLIT GRAFT Right 03/04/2018   Procedure: POSSIBLE SPLIT THICKNESS SKIN GRAFT;  Surgeon: [name], MD;  Location: [facility];  Service: Orthopedics;  Laterality: Right;   SKIN SPLIT GRAFT Right 04/01/2018   Procedure: REPEAT IRRIGATION AND DEBRIDEMENT RIGHT LEG, APPLY SPLIT THICKNESS SKIN GRAFT;  Surgeon: [name], MD;  Location: [facility];  Service: Orthopedics;  Laterality: Right;   TONSILLECTOMY AND ADENOIDECTOMY     Social History   Occupational History   Occupation: Disabled    Employer: RETIRED  Tobacco Use   Smoking status: Never   Smokeless tobacco: Never  Vaping Use   Vaping Use: Never used  Substance and Sexual Activity   Alcohol use: No    Alcohol/week: 0.0 standard drinks   Drug use: No  Sexual activity: Not Currently

## 2021-01-21 NOTE — Telephone Encounter (Signed)
Message left today with verbals. 

## 2021-01-22 DIAGNOSIS — N1832 Chronic kidney disease, stage 3b: Secondary | ICD-10-CM | POA: Diagnosis not present

## 2021-01-22 DIAGNOSIS — I5033 Acute on chronic diastolic (congestive) heart failure: Secondary | ICD-10-CM | POA: Diagnosis not present

## 2021-01-22 DIAGNOSIS — D631 Anemia in chronic kidney disease: Secondary | ICD-10-CM | POA: Diagnosis not present

## 2021-01-22 DIAGNOSIS — J9621 Acute and chronic respiratory failure with hypoxia: Secondary | ICD-10-CM | POA: Diagnosis not present

## 2021-01-22 DIAGNOSIS — I130 Hypertensive heart and chronic kidney disease with heart failure and stage 1 through stage 4 chronic kidney disease, or unspecified chronic kidney disease: Secondary | ICD-10-CM | POA: Diagnosis not present

## 2021-01-22 DIAGNOSIS — J45909 Unspecified asthma, uncomplicated: Secondary | ICD-10-CM | POA: Diagnosis not present

## 2021-01-22 DIAGNOSIS — E1122 Type 2 diabetes mellitus with diabetic chronic kidney disease: Secondary | ICD-10-CM | POA: Diagnosis not present

## 2021-01-22 DIAGNOSIS — J9622 Acute and chronic respiratory failure with hypercapnia: Secondary | ICD-10-CM | POA: Diagnosis not present

## 2021-01-22 DIAGNOSIS — I2510 Atherosclerotic heart disease of native coronary artery without angina pectoris: Secondary | ICD-10-CM | POA: Diagnosis not present

## 2021-01-23 ENCOUNTER — Telehealth: Payer: Self-pay | Admitting: Internal Medicine

## 2021-01-23 ENCOUNTER — Telehealth: Payer: Self-pay | Admitting: Orthopedic Surgery

## 2021-01-23 DIAGNOSIS — D631 Anemia in chronic kidney disease: Secondary | ICD-10-CM | POA: Diagnosis not present

## 2021-01-23 DIAGNOSIS — I5033 Acute on chronic diastolic (congestive) heart failure: Secondary | ICD-10-CM | POA: Diagnosis not present

## 2021-01-23 DIAGNOSIS — I130 Hypertensive heart and chronic kidney disease with heart failure and stage 1 through stage 4 chronic kidney disease, or unspecified chronic kidney disease: Secondary | ICD-10-CM | POA: Diagnosis not present

## 2021-01-23 DIAGNOSIS — E1122 Type 2 diabetes mellitus with diabetic chronic kidney disease: Secondary | ICD-10-CM | POA: Diagnosis not present

## 2021-01-23 DIAGNOSIS — J9622 Acute and chronic respiratory failure with hypercapnia: Secondary | ICD-10-CM | POA: Diagnosis not present

## 2021-01-23 DIAGNOSIS — J45909 Unspecified asthma, uncomplicated: Secondary | ICD-10-CM | POA: Diagnosis not present

## 2021-01-23 DIAGNOSIS — N1832 Chronic kidney disease, stage 3b: Secondary | ICD-10-CM | POA: Diagnosis not present

## 2021-01-23 DIAGNOSIS — I2510 Atherosclerotic heart disease of native coronary artery without angina pectoris: Secondary | ICD-10-CM | POA: Diagnosis not present

## 2021-01-23 DIAGNOSIS — J9621 Acute and chronic respiratory failure with hypoxia: Secondary | ICD-10-CM | POA: Diagnosis not present

## 2021-01-23 NOTE — Telephone Encounter (Signed)
Aaron Edelman from [facility] called. Says patient has been noncompliant so the Nurses will not provide home care for her but she will get PT. Suggest  OT evaluation be done. Says it may be of help. His call back number is [phone]

## 2021-01-23 NOTE — Telephone Encounter (Signed)
..  Home Health Verbal Orders  Agency:  [facility]  Caller: (Contact and title)  Aaron Edelman Call back #:  Fax #:    Requesting OT/ PT/ Skilled nursing/ Social Work/ Speech:    Physical Therapy Reason for Request:  Progressing Ambulation  - Will coordinate with provider about nursing services - patient will not receive nursing services from [facility]  Frequency:  2 x 5w                     1. 4 w   HH needs F2F w/in last 30 days

## 2021-01-24 ENCOUNTER — Ambulatory Visit: Payer: Medicare PPO

## 2021-01-24 NOTE — Telephone Encounter (Signed)
I called and sw April [name] and April [name] with [facility]. They have had the pt in the past and have worked with her on compression transitioning from serial compression wraps to circaid wraps and lymphedema pumps and the pt continues with noncompliant behavior once progress has been made and she has been d/c from service. They have agreed to take the pt and see her once a week for the next three weeks with the understanding that the pt will be provided education on compression and family has been educated on application of devices again. Physical therapy will continue to work with pt on ambulation and strengthening and  continue to encourage her to not sit all day with the legs down and dependant. I called pt to advise of the steps that we are taking and the need for her compliance.

## 2021-01-24 NOTE — Telephone Encounter (Signed)
Verbal left on VM 

## 2021-01-24 NOTE — Telephone Encounter (Signed)
ok 

## 2021-01-26 DIAGNOSIS — E1151 Type 2 diabetes mellitus with diabetic peripheral angiopathy without gangrene: Secondary | ICD-10-CM | POA: Diagnosis not present

## 2021-01-26 DIAGNOSIS — I130 Hypertensive heart and chronic kidney disease with heart failure and stage 1 through stage 4 chronic kidney disease, or unspecified chronic kidney disease: Secondary | ICD-10-CM | POA: Diagnosis not present

## 2021-01-26 DIAGNOSIS — I5033 Acute on chronic diastolic (congestive) heart failure: Secondary | ICD-10-CM | POA: Diagnosis not present

## 2021-01-26 DIAGNOSIS — J9612 Chronic respiratory failure with hypercapnia: Secondary | ICD-10-CM | POA: Diagnosis not present

## 2021-01-26 DIAGNOSIS — N1832 Chronic kidney disease, stage 3b: Secondary | ICD-10-CM | POA: Diagnosis not present

## 2021-01-26 DIAGNOSIS — I87301 Chronic venous hypertension (idiopathic) without complications of right lower extremity: Secondary | ICD-10-CM | POA: Diagnosis not present

## 2021-01-26 DIAGNOSIS — E1122 Type 2 diabetes mellitus with diabetic chronic kidney disease: Secondary | ICD-10-CM | POA: Diagnosis not present

## 2021-01-26 DIAGNOSIS — D631 Anemia in chronic kidney disease: Secondary | ICD-10-CM | POA: Diagnosis not present

## 2021-01-26 DIAGNOSIS — J9611 Chronic respiratory failure with hypoxia: Secondary | ICD-10-CM | POA: Diagnosis not present

## 2021-01-29 DIAGNOSIS — J9612 Chronic respiratory failure with hypercapnia: Secondary | ICD-10-CM | POA: Diagnosis not present

## 2021-01-29 DIAGNOSIS — E1151 Type 2 diabetes mellitus with diabetic peripheral angiopathy without gangrene: Secondary | ICD-10-CM | POA: Diagnosis not present

## 2021-01-29 DIAGNOSIS — I130 Hypertensive heart and chronic kidney disease with heart failure and stage 1 through stage 4 chronic kidney disease, or unspecified chronic kidney disease: Secondary | ICD-10-CM | POA: Diagnosis not present

## 2021-01-29 DIAGNOSIS — N1832 Chronic kidney disease, stage 3b: Secondary | ICD-10-CM | POA: Diagnosis not present

## 2021-01-29 DIAGNOSIS — I5033 Acute on chronic diastolic (congestive) heart failure: Secondary | ICD-10-CM | POA: Diagnosis not present

## 2021-01-29 DIAGNOSIS — D631 Anemia in chronic kidney disease: Secondary | ICD-10-CM | POA: Diagnosis not present

## 2021-01-29 DIAGNOSIS — J9611 Chronic respiratory failure with hypoxia: Secondary | ICD-10-CM | POA: Diagnosis not present

## 2021-01-29 DIAGNOSIS — E1122 Type 2 diabetes mellitus with diabetic chronic kidney disease: Secondary | ICD-10-CM | POA: Diagnosis not present

## 2021-01-29 DIAGNOSIS — I87301 Chronic venous hypertension (idiopathic) without complications of right lower extremity: Secondary | ICD-10-CM | POA: Diagnosis not present

## 2021-01-30 ENCOUNTER — Ambulatory Visit (INDEPENDENT_AMBULATORY_CARE_PROVIDER_SITE_OTHER): Payer: Medicare PPO

## 2021-01-30 ENCOUNTER — Other Ambulatory Visit: Payer: Self-pay

## 2021-01-30 ENCOUNTER — Ambulatory Visit (INDEPENDENT_AMBULATORY_CARE_PROVIDER_SITE_OTHER): Payer: Medicare PPO | Admitting: Orthopedic Surgery

## 2021-01-30 DIAGNOSIS — E538 Deficiency of other specified B group vitamins: Secondary | ICD-10-CM | POA: Diagnosis not present

## 2021-01-30 DIAGNOSIS — E119 Type 2 diabetes mellitus without complications: Secondary | ICD-10-CM | POA: Diagnosis not present

## 2021-01-30 DIAGNOSIS — I872 Venous insufficiency (chronic) (peripheral): Secondary | ICD-10-CM | POA: Diagnosis not present

## 2021-01-30 DIAGNOSIS — H524 Presbyopia: Secondary | ICD-10-CM | POA: Diagnosis not present

## 2021-01-30 DIAGNOSIS — H2513 Age-related nuclear cataract, bilateral: Secondary | ICD-10-CM | POA: Diagnosis not present

## 2021-01-30 LAB — HM DIABETES EYE EXAM

## 2021-01-30 MED ORDER — CYANOCOBALAMIN 1000 MCG/ML IJ SOLN
1000.0000 ug | Freq: Once | INTRAMUSCULAR | Status: AC
Start: 2021-01-30 — End: 2021-01-30
  Administered 2021-01-30: 16:00:00 1000 ug via INTRAMUSCULAR

## 2021-01-30 NOTE — Progress Notes (Signed)
Pt here for monthly B12 injection per Dr. Burns  B12 1000mcg given IM, and pt tolerated injection well.   

## 2021-01-31 ENCOUNTER — Other Ambulatory Visit: Payer: Self-pay | Admitting: Internal Medicine

## 2021-01-31 ENCOUNTER — Ambulatory Visit: Payer: Medicare PPO

## 2021-01-31 DIAGNOSIS — J9611 Chronic respiratory failure with hypoxia: Secondary | ICD-10-CM | POA: Diagnosis not present

## 2021-01-31 DIAGNOSIS — J9612 Chronic respiratory failure with hypercapnia: Secondary | ICD-10-CM | POA: Diagnosis not present

## 2021-01-31 DIAGNOSIS — I130 Hypertensive heart and chronic kidney disease with heart failure and stage 1 through stage 4 chronic kidney disease, or unspecified chronic kidney disease: Secondary | ICD-10-CM | POA: Diagnosis not present

## 2021-01-31 DIAGNOSIS — N1832 Chronic kidney disease, stage 3b: Secondary | ICD-10-CM | POA: Diagnosis not present

## 2021-01-31 DIAGNOSIS — I5033 Acute on chronic diastolic (congestive) heart failure: Secondary | ICD-10-CM | POA: Diagnosis not present

## 2021-01-31 DIAGNOSIS — I87301 Chronic venous hypertension (idiopathic) without complications of right lower extremity: Secondary | ICD-10-CM | POA: Diagnosis not present

## 2021-01-31 DIAGNOSIS — D631 Anemia in chronic kidney disease: Secondary | ICD-10-CM | POA: Diagnosis not present

## 2021-01-31 DIAGNOSIS — E1151 Type 2 diabetes mellitus with diabetic peripheral angiopathy without gangrene: Secondary | ICD-10-CM | POA: Diagnosis not present

## 2021-01-31 DIAGNOSIS — E1122 Type 2 diabetes mellitus with diabetic chronic kidney disease: Secondary | ICD-10-CM | POA: Diagnosis not present

## 2021-02-04 ENCOUNTER — Telehealth: Payer: Self-pay | Admitting: Internal Medicine

## 2021-02-04 ENCOUNTER — Encounter: Payer: Self-pay | Admitting: Orthopedic Surgery

## 2021-02-04 DIAGNOSIS — N1832 Chronic kidney disease, stage 3b: Secondary | ICD-10-CM | POA: Diagnosis not present

## 2021-02-04 DIAGNOSIS — J9611 Chronic respiratory failure with hypoxia: Secondary | ICD-10-CM | POA: Diagnosis not present

## 2021-02-04 DIAGNOSIS — E1151 Type 2 diabetes mellitus with diabetic peripheral angiopathy without gangrene: Secondary | ICD-10-CM | POA: Diagnosis not present

## 2021-02-04 DIAGNOSIS — I130 Hypertensive heart and chronic kidney disease with heart failure and stage 1 through stage 4 chronic kidney disease, or unspecified chronic kidney disease: Secondary | ICD-10-CM | POA: Diagnosis not present

## 2021-02-04 DIAGNOSIS — D631 Anemia in chronic kidney disease: Secondary | ICD-10-CM | POA: Diagnosis not present

## 2021-02-04 DIAGNOSIS — J9612 Chronic respiratory failure with hypercapnia: Secondary | ICD-10-CM | POA: Diagnosis not present

## 2021-02-04 DIAGNOSIS — E1122 Type 2 diabetes mellitus with diabetic chronic kidney disease: Secondary | ICD-10-CM | POA: Diagnosis not present

## 2021-02-04 DIAGNOSIS — I87301 Chronic venous hypertension (idiopathic) without complications of right lower extremity: Secondary | ICD-10-CM | POA: Diagnosis not present

## 2021-02-04 DIAGNOSIS — I5033 Acute on chronic diastolic (congestive) heart failure: Secondary | ICD-10-CM | POA: Diagnosis not present

## 2021-02-04 NOTE — Telephone Encounter (Signed)
April [name] w/ [facility] informing provider patient fell on 12-31 w/ no injuries or tears in skin  Caller states patient is having increase knee pain in both knees  Offered patient an ov to address knee pain,  patient declined

## 2021-02-04 NOTE — Progress Notes (Signed)
Office Visit Note   Patient: April [name]           Date of Birth: 02-Feb-1951           MRN: [id number] Visit Date: 01/30/2021              Requested by: [name], MD [place],  [place] [address] PCP: [name], MD  Chief Complaint  Patient presents with   Left Leg - Follow-up   Right Leg - Follow-up      HPI: Patient is seen for evaluation for bilateral lower extremity venous and lymphatic insufficiency she has been undergoing serial compression wraps however the wraps keep getting pushed down.  Patient states that she does have a lymphedema pump but does not know how to use it.  Assessment & Plan: Visit Diagnoses:  1. Venous stasis dermatitis of both lower extremities     Plan: We will rewrap her legs she will contact the lymphedema pump customer service to provide education and see if these need to be resized.  Patient states that home health nursing has not been able to provide her compression wraps.  Follow-Up Instructions: Return in about 1 week (around 02/06/2021).   Ortho Exam  Patient is alert, oriented, no adenopathy, well-dressed, normal affect, normal respiratory effort. Examination the distal aspect of both legs has excellent decreased edema there are no open ulcers no cellulitis no drainage.  The proximal half of the calf has significant venous and lymphatic insufficiency.  Imaging: No results found. No images are attached to the encounter.  Labs: Lab Results  Component Value Date   HGBA1C 6.4 11/29/2020   HGBA1C 6.0 (A) 09/13/2020   HGBA1C 6.0 09/13/2020   HGBA1C 6.0 09/13/2020   HGBA1C 6.0 09/13/2020   ESRSEDRATE 13 05/08/2020   ESRSEDRATE 42 (H) 08/12/2018   ESRSEDRATE 56 (H) 03/09/2018   CRP 1.6 (H) 02/04/2019   CRP 1.5 (H) 12/10/2018   CRP 2.1 (H) 12/09/2018   LABURIC 11.1 (H) 02/04/2019   LABURIC 10.5 (H) 09/13/2018   LABURIC 11.0 (H) 09/11/2018   REPTSTATUS 11/21/2020 FINAL 11/20/2020   GRAMSTAIN  05/27/2018     FEW WBC PRESENT, PREDOMINANTLY PMN MODERATE GRAM POSITIVE RODS RARE GRAM POSITIVE COCCI IN PAIRS    CULT MULTIPLE SPECIES PRESENT, SUGGEST RECOLLECTION (A) 11/20/2020   LABORGA CITROBACTER FREUNDII (A) 02/15/2019   LABORGA PSEUDOMONAS AERUGINOSA (A) 02/15/2019     Lab Results  Component Value Date   ALBUMIN 3.9 12/31/2020   ALBUMIN 3.5 12/10/2020   ALBUMIN 4.0 11/29/2020    Lab Results  Component Value Date   MG 2.4 11/20/2020   MG 2.3 11/19/2020   MG 2.3 11/18/2020   Lab Results  Component Value Date   VD25OH 47.2 06/02/2016   VD25OH 42 01/23/2011    No results found for: PREALBUMIN CBC EXTENDED Latest Ref Rng & Units 12/31/2020 12/17/2020 12/10/2020  WBC 4.0 - 10.5 K/uL 5.1 5.3 5.7  RBC 3.87 - 5.11 MIL/uL 3.99 3.84(L) 3.78(L)  HGB 12.0 - 15.0 g/dL 12.2 11.8(L) 11.4(L)  HCT 36.0 - 46.0 % 39.5 38.6 37.3  PLT 150 - 400 K/uL 176 110(L) 111(L)  NEUTROABS 1.7 - 7.7 K/uL 3.5 3.5 3.8  LYMPHSABS 0.7 - 4.0 K/uL 1.0 1.1 1.2     There is no height or weight on file to calculate BMI.  Orders:  No orders of the defined types were placed in this encounter.  No orders of the defined types were  placed in this encounter.    Procedures: No procedures performed  Clinical Data: No additional findings.  ROS:  All other systems negative, except as noted in the HPI. Review of Systems  Objective: Vital Signs: There were no vitals taken for this visit.  Specialty Comments:  No specialty comments available.  PMFS History: Patient Active Problem List   Diagnosis Date Noted   Nocturnal hypoxemia due to obesity 01/03/2021   Thrombocytopenia ([place]) 12/12/2020   Frequent nosebleeds 11/29/2020   Obesity hypoventilation syndrome ([place]) 11/15/2020   History of DVT (deep vein thrombosis) 11/15/2020   Encounter for chronic pain management 09/05/2020   Cellulitis, leg 05/08/2020   Leg DVT (deep venous thromboembolism), acute, bilateral ([place]) 05/08/2020   SOB (shortness of  breath)    Gout    Morbid obesity ([place])    Pneumonia due to COVID-19 virus 12/10/2018   COVID-19 virus infection 12/06/2018   Right hip pain 11/09/2018   Osteomyelitis of ankle or foot, acute, left ([place]) 09/07/2018   Onychomycosis 05/19/2018   Idiopathic chronic venous hypertension of right lower extremity with ulcer and inflammation ([place]) 05/19/2018   Skin lumps 01/14/2018   Bilateral leg cramps 12/14/2017   Left shoulder pain 08/14/2016   Meralgia paresthetica 07/16/2016   Chronic left-sided low back pain with left-sided sciatica 06/02/2016   Osteopenia 01/11/2016   CKD (chronic kidney disease) stage 3, GFR 30-59 ml/min (HCC) 01/02/2016   Carpal tunnel syndrome 06/07/2015   Family history of colon cancer    Diabetes mellitus with neurological manifestations ([place]) 04/18/2015   Bilateral leg edema 04/11/2015   Abnormality of gait 01/03/2015   Hereditary and idiopathic peripheral neuropathy 01/03/2015   GERD (gastroesophageal reflux disease) 06/17/2014   Hx of colonic polyps 12/15/2012   Vitamin B12 deficiency 11/03/2012   Intrinsic asthma 03/23/2012   Chronic diastolic (congestive) heart failure ([place]) 02/20/2011   OSA (obstructive sleep apnea) 09/17/2010   CAD, NATIVE VESSEL 11/20/2008   Osteoarthritis of knees, bilateral 06/14/2008   Hyperlipidemia 05/10/2007   Essential hypertension 01/18/2007   Hypokalemia 04/30/2006   HX, PERSONAL, PEPTIC ULCER DISEASE 04/30/2006   Past Medical History:  Diagnosis Date   Anemia    Asthma    CAD (coronary artery disease)    Carpal tunnel syndrome    Cellulitis of both lower extremities 04/11/2015   CHF (congestive heart failure) ([place])    Chronic kidney disease    CKI- followed by [facility]   Colon polyp, hyperplastic 1062 & 6948   Complication of anesthesia 1999   svt with renal calculi surgery, no problems since   CTS (carpal tunnel syndrome)    bilateral   Diabetes mellitus    Eczema    GERD (gastroesophageal reflux  disease)    History of kidney stones 1999   Hyperlipidemia    Hypertension    Leg ulcer ([place]) 04/24/2015   Right lateral leg No evidence of an infection Monitor closely Keep edema controlled    Leg ulcer ([place])    right lower   Meralgia paresthetica    Dr. [name]   Morbid obesity ([place])    Neuropathy    toes and legs   Osteoarthrosis, unspecified whether generalized or localized, lower leg    knee   PUD (peptic ulcer disease)    Shortness of breath dyspnea    with exertion   Sleep apnea    per progress note 02/25/2018   Type II or unspecified type diabetes mellitus without mention of complication, not stated as uncontrolled  Unspecified hereditary and idiopathic peripheral neuropathy    Urticaria    Vitamin B12 deficiency    Wears glasses    Wound cellulitis    right upper leg, healing well    Family History  Problem Relation Age of Onset   Colon cancer Mother    Prostate cancer Father    Colon cancer Father    Diabetes Maternal Aunt    Breast cancer Maternal Aunt    Diabetes Maternal Uncle    Diabetes Paternal Aunt    Stroke Paternal Aunt        > 4   Heart disease Paternal Aunt    Diabetes Paternal Uncle    Breast cancer Maternal Aunt         X 2   Breast cancer Cousin     Past Surgical History:  Procedure Laterality Date   ABDOMINAL HYSTERECTOMY     BREAST BIOPSY Right 07/08/2018   CARDIAC CATHETERIZATION  2002   non obstructive disease   colonoscopy with polypectomy  2007 & 2012    hyperplastic ;Dr Watt Climes   COLONOSCOPY WITH PROPOFOL N/A 06/04/2015   Procedure: COLONOSCOPY WITH PROPOFOL;  Surgeon: Jerene Bears, MD;  Location: [facility];  Service: Gastroenterology;  Laterality: N/A;   DEBRIDEMENT LEG Right 03/02/2018   WOUND VAC APPLIED   DEBRIDEMENT LEG Right 05/27/2018   RIGHT LOWER LEG DEBRIDEMENT,  SKIN GRAFT, VAC PLACEMENT    DILATION AND CURETTAGE OF UTERUS     multiple   HEMORRHOID SURGERY     HEMORROIDECTOMY     I & D EXTREMITY Right 03/02/2018    Procedure: RIGHT LEG DEBRIDEMENT AND PLACE VAC;  Surgeon: [name], MD;  Location: [facility];  Service: Orthopedics;  Laterality: Right;   I & D EXTREMITY Right 03/04/2018   Procedure: REPEAT IRRIGATION AND DEBRIDEMENT RIGHT LEG, PLACE WOUND VAC;  Surgeon: [name], MD;  Location: [name] ;  Service: Orthopedics;  Laterality: Right;   I & D EXTREMITY Right 03/30/2018   Procedure: IRRIGATION AND DEBRIDEMENT RIGHT LEG, APPLY WOUND VAC;  Surgeon: [name], MD;  Location: [facility];  Service: Orthopedics;  Laterality: Right;   I & D EXTREMITY Right 05/27/2018   Procedure: RIGHT LOWER LEG DEBRIDEMENT,  SKIN GRAFT, VAC PLACEMENT;  Surgeon: [name], MD;  Location: [facility];  Service: Orthopedics;  Laterality: Right;  RIGHT LOWER LEG DEBRIDEMENT,  SKIN GRAFT, VAC PLACEMENT   renal calculi  12/1997   SVT with induction of anesthesia   RIGHT HEART CATH N/A 11/19/2020   Procedure: RIGHT HEART CATH;  Surgeon: Jolaine Artist, MD;  Location: [facility];  Service: Cardiovascular;  Laterality: N/A;   right knee arthroscopy     SKIN SPLIT GRAFT Right 03/04/2018   Procedure: POSSIBLE SPLIT THICKNESS SKIN GRAFT;  Surgeon: [name], MD;  Location: [facility];  Service: Orthopedics;  Laterality: Right;   SKIN SPLIT GRAFT Right 04/01/2018   Procedure: REPEAT IRRIGATION AND DEBRIDEMENT RIGHT LEG, APPLY SPLIT THICKNESS SKIN GRAFT;  Surgeon: [name], MD;  Location: [facility];  Service: Orthopedics;  Laterality: Right;   TONSILLECTOMY AND ADENOIDECTOMY     Social History   Occupational History   Occupation: Disabled    Employer: RETIRED  Tobacco Use   Smoking status: Never   Smokeless tobacco: Never  Vaping Use   Vaping Use: Never used  Substance and Sexual Activity   Alcohol use: No    Alcohol/week: 0.0 standard drinks  Drug use: No   Sexual activity: Not Currently

## 2021-02-05 ENCOUNTER — Encounter: Payer: Self-pay | Admitting: Family

## 2021-02-05 ENCOUNTER — Ambulatory Visit (INDEPENDENT_AMBULATORY_CARE_PROVIDER_SITE_OTHER): Payer: Medicare PPO | Admitting: Family

## 2021-02-05 DIAGNOSIS — I872 Venous insufficiency (chronic) (peripheral): Secondary | ICD-10-CM | POA: Diagnosis not present

## 2021-02-05 DIAGNOSIS — M7989 Other specified soft tissue disorders: Secondary | ICD-10-CM

## 2021-02-05 NOTE — Progress Notes (Signed)
Office Visit Note   Patient: April [name]           Date of Birth: [birth year]           MRN: [id number] Visit Date: 02/05/2021              Requested by: [name], MD [place],  [place] [address] PCP: [name], MD  Chief Complaint  Patient presents with   Left Leg - Follow-up   Right Leg - Follow-up      HPI: The patient is a 71 year old woman who presents today routine follow-up.  Seen for bilateral lower extremity edema combination of venous and lymphatic insufficiency.  She has been undergoing serial compression wraps these continue to be pushed down to the distal third of her lower legs.  We have made arrangements with home health that they will be coming out doing education with her she will begin wearing her CircAid compression garments.  The lymphedema company is also going to come out to the home and educate her on using her lymphedema pumps which she will be using overnight each night.    Assessment & Plan: Visit Diagnoses:  No diagnosis found.   Plan: Home health nursing for compression garments during the day.  Lymphedema pumps at night.  She will follow-up in the office in several weeks and we will check her progress.  Follow-Up Instructions: No follow-ups on file.   Ortho Exam  Patient is alert, oriented, no adenopathy, well-dressed, normal affect, normal respiratory effort. Examination the distal aspect of both legs has excellent decreased edema there are no open ulcers no cellulitis no drainage.  The proximal half of the calf has significant venous and lymphatic insufficiency.  Imaging: No results found. No images are attached to the encounter.  Labs: Lab Results  Component Value Date   HGBA1C 6.4 11/29/2020   HGBA1C 6.0 (A) 09/13/2020   HGBA1C 6.0 09/13/2020   HGBA1C 6.0 09/13/2020   HGBA1C 6.0 09/13/2020   ESRSEDRATE 13 05/08/2020   ESRSEDRATE 42 (H) 08/12/2018   ESRSEDRATE 56 (H) 03/09/2018   CRP 1.6 (H)  02/04/2019   CRP 1.5 (H) 12/10/2018   CRP 2.1 (H) 12/09/2018   LABURIC 11.1 (H) 02/04/2019   LABURIC 10.5 (H) 09/13/2018   LABURIC 11.0 (H) 09/11/2018   REPTSTATUS 11/21/2020 FINAL 11/20/2020   GRAMSTAIN  05/27/2018    FEW WBC PRESENT, PREDOMINANTLY PMN MODERATE GRAM POSITIVE RODS RARE GRAM POSITIVE COCCI IN PAIRS    CULT MULTIPLE SPECIES PRESENT, SUGGEST RECOLLECTION (A) 11/20/2020   LABORGA CITROBACTER FREUNDII (A) 02/15/2019   LABORGA PSEUDOMONAS AERUGINOSA (A) 02/15/2019     Lab Results  Component Value Date   ALBUMIN 3.9 12/31/2020   ALBUMIN 3.5 12/10/2020   ALBUMIN 4.0 11/29/2020    Lab Results  Component Value Date   MG 2.4 11/20/2020   MG 2.3 11/19/2020   MG 2.3 11/18/2020   Lab Results  Component Value Date   VD25OH 47.2 06/02/2016   VD25OH 42 01/23/2011    No results found for: PREALBUMIN CBC EXTENDED Latest Ref Rng & Units 12/31/2020 12/17/2020 12/10/2020  WBC 4.0 - 10.5 K/uL 5.1 5.3 5.7  RBC 3.87 - 5.11 MIL/uL 3.99 3.84(L) 3.78(L)  HGB 12.0 - 15.0 g/dL 12.2 11.8(L) 11.4(L)  HCT 36.0 - 46.0 % 39.5 38.6 37.3  PLT 150 - 400 K/uL 176 110(L) 111(L)  NEUTROABS 1.7 - 7.7 K/uL 3.5 3.5 3.8  LYMPHSABS 0.7 - 4.0 K/uL 1.0 1.1  1.2     There is no height or weight on file to calculate BMI.  Orders:  No orders of the defined types were placed in this encounter.  No orders of the defined types were placed in this encounter.    Procedures: No procedures performed  Clinical Data: No additional findings.  ROS:  All other systems negative, except as noted in the HPI. Review of Systems  Constitutional:  Negative for chills and fever.  Cardiovascular:  Positive for leg swelling.  Skin:  Negative for color change and wound.   Objective: Vital Signs: There were no vitals taken for this visit.  Specialty Comments:  No specialty comments available.  PMFS History: Patient Active Problem List   Diagnosis Date Noted   Nocturnal hypoxemia due to obesity  01/03/2021   Thrombocytopenia ([place]) 12/12/2020   Frequent nosebleeds 11/29/2020   Obesity hypoventilation syndrome ([place]) 11/15/2020   History of DVT (deep vein thrombosis) 11/15/2020   Encounter for chronic pain management 09/05/2020   Cellulitis, leg 05/08/2020   Leg DVT (deep venous thromboembolism), acute, bilateral ([place]) 05/08/2020   SOB (shortness of breath)    Gout    Morbid obesity ([place])    Pneumonia due to COVID-19 virus 12/10/2018   COVID-19 virus infection 12/06/2018   Right hip pain 11/09/2018   Osteomyelitis of ankle or foot, acute, left ([place]) 09/07/2018   Onychomycosis 05/19/2018   Idiopathic chronic venous hypertension of right lower extremity with ulcer and inflammation ([place]) 05/19/2018   Skin lumps 01/14/2018   Bilateral leg cramps 12/14/2017   Left shoulder pain 08/14/2016   Meralgia paresthetica 07/16/2016   Chronic left-sided low back pain with left-sided sciatica 06/02/2016   Osteopenia 01/11/2016   CKD (chronic kidney disease) stage 3, GFR 30-59 ml/min (HCC) 01/02/2016   Carpal tunnel syndrome 06/07/2015   Family history of colon cancer    Diabetes mellitus with neurological manifestations ([place]) 04/18/2015   Bilateral leg edema 04/11/2015   Abnormality of gait 01/03/2015   Hereditary and idiopathic peripheral neuropathy 01/03/2015   GERD (gastroesophageal reflux disease) 06/17/2014   Hx of colonic polyps 12/15/2012   Vitamin B12 deficiency 11/03/2012   Intrinsic asthma 03/23/2012   Chronic diastolic (congestive) heart failure ([place]) 02/20/2011   OSA (obstructive sleep apnea) 09/17/2010   CAD, NATIVE VESSEL 11/20/2008   Osteoarthritis of knees, bilateral 06/14/2008   Hyperlipidemia 05/10/2007   Essential hypertension 01/18/2007   Hypokalemia 04/30/2006   HX, PERSONAL, PEPTIC ULCER DISEASE 04/30/2006   Past Medical History:  Diagnosis Date   Anemia    Asthma    CAD (coronary artery disease)    Carpal tunnel syndrome    Cellulitis of both lower  extremities 04/11/2015   CHF (congestive heart failure) ([place])    Chronic kidney disease    CKI- followed by [facility]   Colon polyp, hyperplastic 5329 & 9242   Complication of anesthesia 1999   svt with renal calculi surgery, no problems since   CTS (carpal tunnel syndrome)    bilateral   Diabetes mellitus    Eczema    GERD (gastroesophageal reflux disease)    History of kidney stones 1999   Hyperlipidemia    Hypertension    Leg ulcer ([place]) 04/24/2015   Right lateral leg No evidence of an infection Monitor closely Keep edema controlled    Leg ulcer ([place])    right lower   Meralgia paresthetica    Dr. [name]   Morbid obesity ([place])    Neuropathy  toes and legs   Osteoarthrosis, unspecified whether generalized or localized, lower leg    knee   PUD (peptic ulcer disease)    Shortness of breath dyspnea    with exertion   Sleep apnea    per progress note 02/25/2018   Type II or unspecified type diabetes mellitus without mention of complication, not stated as uncontrolled    Unspecified hereditary and idiopathic peripheral neuropathy    Urticaria    Vitamin B12 deficiency    Wears glasses    Wound cellulitis    right upper leg, healing well    Family History  Problem Relation Age of Onset   Colon cancer Mother    Prostate cancer Father    Colon cancer Father    Diabetes Maternal Aunt    Breast cancer Maternal Aunt    Diabetes Maternal Uncle    Diabetes Paternal Aunt    Stroke Paternal Aunt        > 63   Heart disease Paternal Aunt    Diabetes Paternal Uncle    Breast cancer Maternal Aunt         X 2   Breast cancer Cousin     Past Surgical History:  Procedure Laterality Date   ABDOMINAL HYSTERECTOMY     BREAST BIOPSY Right 07/08/2018   CARDIAC CATHETERIZATION  2002   non obstructive disease   colonoscopy with polypectomy  2007 & 2012    hyperplastic ;Dr Watt Climes   COLONOSCOPY WITH PROPOFOL N/A 06/04/2015   Procedure: COLONOSCOPY WITH PROPOFOL;  Surgeon: Jerene Bears, MD;  Location: [facility];  Service: Gastroenterology;  Laterality: N/A;   DEBRIDEMENT LEG Right 03/02/2018   WOUND VAC APPLIED   DEBRIDEMENT LEG Right 05/27/2018   RIGHT LOWER LEG DEBRIDEMENT,  SKIN GRAFT, VAC PLACEMENT    DILATION AND CURETTAGE OF UTERUS     multiple   HEMORRHOID SURGERY     HEMORROIDECTOMY     I & D EXTREMITY Right 03/02/2018   Procedure: RIGHT LEG DEBRIDEMENT AND PLACE VAC;  Surgeon: [name], MD;  Location: [facility];  Service: Orthopedics;  Laterality: Right;   I & D EXTREMITY Right 03/04/2018   Procedure: REPEAT IRRIGATION AND DEBRIDEMENT RIGHT LEG, PLACE WOUND VAC;  Surgeon: [name], MD;  Location: [facility];  Service: Orthopedics;  Laterality: Right;   I & D EXTREMITY Right 03/30/2018   Procedure: IRRIGATION AND DEBRIDEMENT RIGHT LEG, APPLY WOUND VAC;  Surgeon: [name], MD;  Location: [facility];  Service: Orthopedics;  Laterality: Right;   I & D EXTREMITY Right 05/27/2018   Procedure: RIGHT LOWER LEG DEBRIDEMENT,  SKIN GRAFT, VAC PLACEMENT;  Surgeon: [name], MD;  Location: [facility];  Service: Orthopedics;  Laterality: Right;  RIGHT LOWER LEG DEBRIDEMENT,  SKIN GRAFT, VAC PLACEMENT   renal calculi  12/1997   SVT with induction of anesthesia   RIGHT HEART CATH N/A 11/19/2020   Procedure: RIGHT HEART CATH;  Surgeon: Jolaine Artist, MD;  Location: [facility];  Service: Cardiovascular;  Laterality: N/A;   right knee arthroscopy     SKIN SPLIT GRAFT Right 03/04/2018   Procedure: POSSIBLE SPLIT THICKNESS SKIN GRAFT;  Surgeon: [name], MD;  Location: [facility];  Service: Orthopedics;  Laterality: Right;   SKIN SPLIT GRAFT Right 04/01/2018   Procedure: REPEAT IRRIGATION AND DEBRIDEMENT RIGHT LEG, APPLY SPLIT THICKNESS SKIN GRAFT;  Surgeon: [name], MD;  Location: [facility];  Service: Orthopedics;  Laterality: Right;   TONSILLECTOMY AND ADENOIDECTOMY     Social History   Occupational History   Occupation: Disabled    Employer:  RETIRED  Tobacco Use   Smoking status: Never   Smokeless tobacco: Never  Vaping Use   Vaping Use: Never used  Substance and Sexual Activity   Alcohol use: No    Alcohol/week: 0.0 standard drinks   Drug use: No   Sexual activity: Not Currently

## 2021-02-06 DIAGNOSIS — E1151 Type 2 diabetes mellitus with diabetic peripheral angiopathy without gangrene: Secondary | ICD-10-CM | POA: Diagnosis not present

## 2021-02-06 DIAGNOSIS — E1122 Type 2 diabetes mellitus with diabetic chronic kidney disease: Secondary | ICD-10-CM | POA: Diagnosis not present

## 2021-02-06 DIAGNOSIS — I5033 Acute on chronic diastolic (congestive) heart failure: Secondary | ICD-10-CM | POA: Diagnosis not present

## 2021-02-06 DIAGNOSIS — D631 Anemia in chronic kidney disease: Secondary | ICD-10-CM | POA: Diagnosis not present

## 2021-02-06 DIAGNOSIS — J9612 Chronic respiratory failure with hypercapnia: Secondary | ICD-10-CM | POA: Diagnosis not present

## 2021-02-06 DIAGNOSIS — N1832 Chronic kidney disease, stage 3b: Secondary | ICD-10-CM | POA: Diagnosis not present

## 2021-02-06 DIAGNOSIS — J9611 Chronic respiratory failure with hypoxia: Secondary | ICD-10-CM | POA: Diagnosis not present

## 2021-02-06 DIAGNOSIS — I87301 Chronic venous hypertension (idiopathic) without complications of right lower extremity: Secondary | ICD-10-CM | POA: Diagnosis not present

## 2021-02-06 DIAGNOSIS — I130 Hypertensive heart and chronic kidney disease with heart failure and stage 1 through stage 4 chronic kidney disease, or unspecified chronic kidney disease: Secondary | ICD-10-CM | POA: Diagnosis not present

## 2021-02-06 NOTE — Telephone Encounter (Signed)
Spoke with patient's husband on yesterday and patient is doing better.  No injuries noted.

## 2021-02-07 ENCOUNTER — Ambulatory Visit: Payer: Medicare PPO | Admitting: Family

## 2021-02-07 DIAGNOSIS — J9612 Chronic respiratory failure with hypercapnia: Secondary | ICD-10-CM | POA: Diagnosis not present

## 2021-02-07 DIAGNOSIS — N390 Urinary tract infection, site not specified: Secondary | ICD-10-CM | POA: Diagnosis not present

## 2021-02-07 DIAGNOSIS — I5033 Acute on chronic diastolic (congestive) heart failure: Secondary | ICD-10-CM | POA: Diagnosis not present

## 2021-02-07 DIAGNOSIS — I890 Lymphedema, not elsewhere classified: Secondary | ICD-10-CM | POA: Diagnosis not present

## 2021-02-07 DIAGNOSIS — D631 Anemia in chronic kidney disease: Secondary | ICD-10-CM | POA: Diagnosis not present

## 2021-02-07 DIAGNOSIS — N1832 Chronic kidney disease, stage 3b: Secondary | ICD-10-CM | POA: Diagnosis not present

## 2021-02-07 DIAGNOSIS — G4733 Obstructive sleep apnea (adult) (pediatric): Secondary | ICD-10-CM | POA: Diagnosis not present

## 2021-02-07 DIAGNOSIS — E1151 Type 2 diabetes mellitus with diabetic peripheral angiopathy without gangrene: Secondary | ICD-10-CM | POA: Diagnosis not present

## 2021-02-07 DIAGNOSIS — E1122 Type 2 diabetes mellitus with diabetic chronic kidney disease: Secondary | ICD-10-CM | POA: Diagnosis not present

## 2021-02-07 DIAGNOSIS — N2581 Secondary hyperparathyroidism of renal origin: Secondary | ICD-10-CM | POA: Diagnosis not present

## 2021-02-07 DIAGNOSIS — I130 Hypertensive heart and chronic kidney disease with heart failure and stage 1 through stage 4 chronic kidney disease, or unspecified chronic kidney disease: Secondary | ICD-10-CM | POA: Diagnosis not present

## 2021-02-07 DIAGNOSIS — N1831 Chronic kidney disease, stage 3a: Secondary | ICD-10-CM | POA: Diagnosis not present

## 2021-02-07 DIAGNOSIS — I5032 Chronic diastolic (congestive) heart failure: Secondary | ICD-10-CM | POA: Diagnosis not present

## 2021-02-07 DIAGNOSIS — I82409 Acute embolism and thrombosis of unspecified deep veins of unspecified lower extremity: Secondary | ICD-10-CM | POA: Diagnosis not present

## 2021-02-07 DIAGNOSIS — J9611 Chronic respiratory failure with hypoxia: Secondary | ICD-10-CM | POA: Diagnosis not present

## 2021-02-07 DIAGNOSIS — I87301 Chronic venous hypertension (idiopathic) without complications of right lower extremity: Secondary | ICD-10-CM | POA: Diagnosis not present

## 2021-02-11 DIAGNOSIS — E1151 Type 2 diabetes mellitus with diabetic peripheral angiopathy without gangrene: Secondary | ICD-10-CM | POA: Diagnosis not present

## 2021-02-11 DIAGNOSIS — N1832 Chronic kidney disease, stage 3b: Secondary | ICD-10-CM | POA: Diagnosis not present

## 2021-02-11 DIAGNOSIS — I87301 Chronic venous hypertension (idiopathic) without complications of right lower extremity: Secondary | ICD-10-CM | POA: Diagnosis not present

## 2021-02-11 DIAGNOSIS — I5033 Acute on chronic diastolic (congestive) heart failure: Secondary | ICD-10-CM | POA: Diagnosis not present

## 2021-02-11 DIAGNOSIS — J9611 Chronic respiratory failure with hypoxia: Secondary | ICD-10-CM | POA: Diagnosis not present

## 2021-02-11 DIAGNOSIS — E1122 Type 2 diabetes mellitus with diabetic chronic kidney disease: Secondary | ICD-10-CM | POA: Diagnosis not present

## 2021-02-11 DIAGNOSIS — I130 Hypertensive heart and chronic kidney disease with heart failure and stage 1 through stage 4 chronic kidney disease, or unspecified chronic kidney disease: Secondary | ICD-10-CM | POA: Diagnosis not present

## 2021-02-11 DIAGNOSIS — J9612 Chronic respiratory failure with hypercapnia: Secondary | ICD-10-CM | POA: Diagnosis not present

## 2021-02-11 DIAGNOSIS — D631 Anemia in chronic kidney disease: Secondary | ICD-10-CM | POA: Diagnosis not present

## 2021-02-13 DIAGNOSIS — E1151 Type 2 diabetes mellitus with diabetic peripheral angiopathy without gangrene: Secondary | ICD-10-CM | POA: Diagnosis not present

## 2021-02-13 DIAGNOSIS — N1832 Chronic kidney disease, stage 3b: Secondary | ICD-10-CM | POA: Diagnosis not present

## 2021-02-13 DIAGNOSIS — J9612 Chronic respiratory failure with hypercapnia: Secondary | ICD-10-CM | POA: Diagnosis not present

## 2021-02-13 DIAGNOSIS — J9611 Chronic respiratory failure with hypoxia: Secondary | ICD-10-CM | POA: Diagnosis not present

## 2021-02-13 DIAGNOSIS — E1122 Type 2 diabetes mellitus with diabetic chronic kidney disease: Secondary | ICD-10-CM | POA: Diagnosis not present

## 2021-02-13 DIAGNOSIS — I130 Hypertensive heart and chronic kidney disease with heart failure and stage 1 through stage 4 chronic kidney disease, or unspecified chronic kidney disease: Secondary | ICD-10-CM | POA: Diagnosis not present

## 2021-02-13 DIAGNOSIS — I5033 Acute on chronic diastolic (congestive) heart failure: Secondary | ICD-10-CM | POA: Diagnosis not present

## 2021-02-13 DIAGNOSIS — I87301 Chronic venous hypertension (idiopathic) without complications of right lower extremity: Secondary | ICD-10-CM | POA: Diagnosis not present

## 2021-02-13 DIAGNOSIS — D631 Anemia in chronic kidney disease: Secondary | ICD-10-CM | POA: Diagnosis not present

## 2021-02-14 DIAGNOSIS — D631 Anemia in chronic kidney disease: Secondary | ICD-10-CM | POA: Diagnosis not present

## 2021-02-14 DIAGNOSIS — I87301 Chronic venous hypertension (idiopathic) without complications of right lower extremity: Secondary | ICD-10-CM | POA: Diagnosis not present

## 2021-02-14 DIAGNOSIS — J9611 Chronic respiratory failure with hypoxia: Secondary | ICD-10-CM | POA: Diagnosis not present

## 2021-02-14 DIAGNOSIS — N1832 Chronic kidney disease, stage 3b: Secondary | ICD-10-CM | POA: Diagnosis not present

## 2021-02-14 DIAGNOSIS — I130 Hypertensive heart and chronic kidney disease with heart failure and stage 1 through stage 4 chronic kidney disease, or unspecified chronic kidney disease: Secondary | ICD-10-CM | POA: Diagnosis not present

## 2021-02-14 DIAGNOSIS — J9612 Chronic respiratory failure with hypercapnia: Secondary | ICD-10-CM | POA: Diagnosis not present

## 2021-02-14 DIAGNOSIS — E1151 Type 2 diabetes mellitus with diabetic peripheral angiopathy without gangrene: Secondary | ICD-10-CM | POA: Diagnosis not present

## 2021-02-14 DIAGNOSIS — E1122 Type 2 diabetes mellitus with diabetic chronic kidney disease: Secondary | ICD-10-CM | POA: Diagnosis not present

## 2021-02-14 DIAGNOSIS — I5033 Acute on chronic diastolic (congestive) heart failure: Secondary | ICD-10-CM | POA: Diagnosis not present

## 2021-02-18 ENCOUNTER — Telehealth: Payer: Self-pay | Admitting: Internal Medicine

## 2021-02-18 ENCOUNTER — Other Ambulatory Visit: Payer: Self-pay

## 2021-02-18 DIAGNOSIS — I5033 Acute on chronic diastolic (congestive) heart failure: Secondary | ICD-10-CM | POA: Diagnosis not present

## 2021-02-18 DIAGNOSIS — D631 Anemia in chronic kidney disease: Secondary | ICD-10-CM | POA: Diagnosis not present

## 2021-02-18 DIAGNOSIS — E1122 Type 2 diabetes mellitus with diabetic chronic kidney disease: Secondary | ICD-10-CM | POA: Diagnosis not present

## 2021-02-18 DIAGNOSIS — I87301 Chronic venous hypertension (idiopathic) without complications of right lower extremity: Secondary | ICD-10-CM | POA: Diagnosis not present

## 2021-02-18 DIAGNOSIS — J9612 Chronic respiratory failure with hypercapnia: Secondary | ICD-10-CM | POA: Diagnosis not present

## 2021-02-18 DIAGNOSIS — N1832 Chronic kidney disease, stage 3b: Secondary | ICD-10-CM | POA: Diagnosis not present

## 2021-02-18 DIAGNOSIS — J9611 Chronic respiratory failure with hypoxia: Secondary | ICD-10-CM | POA: Diagnosis not present

## 2021-02-18 DIAGNOSIS — I130 Hypertensive heart and chronic kidney disease with heart failure and stage 1 through stage 4 chronic kidney disease, or unspecified chronic kidney disease: Secondary | ICD-10-CM | POA: Diagnosis not present

## 2021-02-18 DIAGNOSIS — E1151 Type 2 diabetes mellitus with diabetic peripheral angiopathy without gangrene: Secondary | ICD-10-CM | POA: Diagnosis not present

## 2021-02-18 MED ORDER — POTASSIUM CHLORIDE CRYS ER 20 MEQ PO TBCR: 20.0000 meq | EXTENDED_RELEASE_TABLET | Freq: Every day | ORAL | 1 refills | Status: DC

## 2021-02-18 NOTE — Telephone Encounter (Signed)
Patient called requesting for Dr. Hilarie Fredrickson to admit hert o the hospital until her procedure due to her being in a wheelchair and she called her insurance and they advised her that it can be done he would need to obtain PA for the services.

## 2021-02-18 NOTE — Telephone Encounter (Signed)
See note below and advise. 

## 2021-02-18 NOTE — Telephone Encounter (Signed)
1.Medication Requested: potassium chloride SA (KLOR-CON) 20 MEQ tablet  2. Pharmacy (Name, Street, [place]): [facility] [phone] - [place], [place] - [address] AT [place]  3. On Med List: yes  4. Last Visit with PCP: 01-17-2021  5. Next visit date with PCP: 04-25-2021   Agent: Please be advised that RX refills may take up to 3 business days. We ask that you follow-up with your pharmacy.

## 2021-02-20 DIAGNOSIS — I130 Hypertensive heart and chronic kidney disease with heart failure and stage 1 through stage 4 chronic kidney disease, or unspecified chronic kidney disease: Secondary | ICD-10-CM | POA: Diagnosis not present

## 2021-02-20 DIAGNOSIS — J9611 Chronic respiratory failure with hypoxia: Secondary | ICD-10-CM | POA: Diagnosis not present

## 2021-02-20 DIAGNOSIS — E1122 Type 2 diabetes mellitus with diabetic chronic kidney disease: Secondary | ICD-10-CM | POA: Diagnosis not present

## 2021-02-20 DIAGNOSIS — I87301 Chronic venous hypertension (idiopathic) without complications of right lower extremity: Secondary | ICD-10-CM | POA: Diagnosis not present

## 2021-02-20 DIAGNOSIS — N1832 Chronic kidney disease, stage 3b: Secondary | ICD-10-CM | POA: Diagnosis not present

## 2021-02-20 DIAGNOSIS — E1151 Type 2 diabetes mellitus with diabetic peripheral angiopathy without gangrene: Secondary | ICD-10-CM | POA: Diagnosis not present

## 2021-02-20 DIAGNOSIS — D631 Anemia in chronic kidney disease: Secondary | ICD-10-CM | POA: Diagnosis not present

## 2021-02-20 DIAGNOSIS — I5033 Acute on chronic diastolic (congestive) heart failure: Secondary | ICD-10-CM | POA: Diagnosis not present

## 2021-02-20 DIAGNOSIS — J9612 Chronic respiratory failure with hypercapnia: Secondary | ICD-10-CM | POA: Diagnosis not present

## 2021-02-20 NOTE — Telephone Encounter (Signed)
Patient is advised that we will make every effort to have her admitted to hospital for prep, but cannot tell her with 892% certainty that there will be a bed available when we need it due to current flu season, increased hospital admissions and staffing shortages. Patient states that if she cannot be prepped at the hospital, she would try to prep at home but knows it would be "a mess" since she is not ambulatory. She also has concerns about just remaining on the toilet for long periods due to edema as well as history of DVT.

## 2021-02-21 DIAGNOSIS — I130 Hypertensive heart and chronic kidney disease with heart failure and stage 1 through stage 4 chronic kidney disease, or unspecified chronic kidney disease: Secondary | ICD-10-CM | POA: Diagnosis not present

## 2021-02-21 DIAGNOSIS — I87301 Chronic venous hypertension (idiopathic) without complications of right lower extremity: Secondary | ICD-10-CM | POA: Diagnosis not present

## 2021-02-21 DIAGNOSIS — J9611 Chronic respiratory failure with hypoxia: Secondary | ICD-10-CM | POA: Diagnosis not present

## 2021-02-21 DIAGNOSIS — N1832 Chronic kidney disease, stage 3b: Secondary | ICD-10-CM | POA: Diagnosis not present

## 2021-02-21 DIAGNOSIS — D631 Anemia in chronic kidney disease: Secondary | ICD-10-CM | POA: Diagnosis not present

## 2021-02-21 DIAGNOSIS — I5033 Acute on chronic diastolic (congestive) heart failure: Secondary | ICD-10-CM | POA: Diagnosis not present

## 2021-02-21 DIAGNOSIS — E1122 Type 2 diabetes mellitus with diabetic chronic kidney disease: Secondary | ICD-10-CM | POA: Diagnosis not present

## 2021-02-21 DIAGNOSIS — J9612 Chronic respiratory failure with hypercapnia: Secondary | ICD-10-CM | POA: Diagnosis not present

## 2021-02-21 DIAGNOSIS — E1151 Type 2 diabetes mellitus with diabetic peripheral angiopathy without gangrene: Secondary | ICD-10-CM | POA: Diagnosis not present

## 2021-02-24 ENCOUNTER — Other Ambulatory Visit: Payer: Self-pay | Admitting: Internal Medicine

## 2021-02-24 DIAGNOSIS — Z1231 Encounter for screening mammogram for malignant neoplasm of breast: Secondary | ICD-10-CM

## 2021-02-24 NOTE — Telephone Encounter (Signed)
I spoke with [facility] hospitalist recommendation.  There is no way for them to except the patient onto their service until day of admission. Therefore I will need to contact [facility] hospitalist to ask for admission to their service on 03/19/2021.  This will need to be done early that morning. We can go ahead and schedule with bed placement the admission on 03/19/2021 She will have upper endoscopy and colonoscopy with me 03/20/2021  April [name]. Zeya Balles, M.D.  02/24/2021

## 2021-02-25 DIAGNOSIS — I130 Hypertensive heart and chronic kidney disease with heart failure and stage 1 through stage 4 chronic kidney disease, or unspecified chronic kidney disease: Secondary | ICD-10-CM | POA: Diagnosis not present

## 2021-02-25 DIAGNOSIS — J9611 Chronic respiratory failure with hypoxia: Secondary | ICD-10-CM | POA: Diagnosis not present

## 2021-02-25 DIAGNOSIS — E1122 Type 2 diabetes mellitus with diabetic chronic kidney disease: Secondary | ICD-10-CM | POA: Diagnosis not present

## 2021-02-25 DIAGNOSIS — E1151 Type 2 diabetes mellitus with diabetic peripheral angiopathy without gangrene: Secondary | ICD-10-CM | POA: Diagnosis not present

## 2021-02-25 DIAGNOSIS — I5033 Acute on chronic diastolic (congestive) heart failure: Secondary | ICD-10-CM | POA: Diagnosis not present

## 2021-02-25 DIAGNOSIS — N1832 Chronic kidney disease, stage 3b: Secondary | ICD-10-CM | POA: Diagnosis not present

## 2021-02-25 DIAGNOSIS — J9612 Chronic respiratory failure with hypercapnia: Secondary | ICD-10-CM | POA: Diagnosis not present

## 2021-02-25 DIAGNOSIS — D631 Anemia in chronic kidney disease: Secondary | ICD-10-CM | POA: Diagnosis not present

## 2021-02-25 DIAGNOSIS — I87301 Chronic venous hypertension (idiopathic) without complications of right lower extremity: Secondary | ICD-10-CM | POA: Diagnosis not present

## 2021-02-25 NOTE — Telephone Encounter (Signed)
I have spoken to April [name] in bed placement (phone [phone]) who has placed request for bed placement the morning on 03/19/21 in preparation for colonoscopy 03/20/21.  Patient notes that she has already picked up the plenvu for the procedure that was originally set to be completed at [facility] facility. She will bring prep with her for hospital observation stay.

## 2021-02-27 DIAGNOSIS — E1122 Type 2 diabetes mellitus with diabetic chronic kidney disease: Secondary | ICD-10-CM | POA: Diagnosis not present

## 2021-02-27 DIAGNOSIS — E1151 Type 2 diabetes mellitus with diabetic peripheral angiopathy without gangrene: Secondary | ICD-10-CM | POA: Diagnosis not present

## 2021-02-27 DIAGNOSIS — J9611 Chronic respiratory failure with hypoxia: Secondary | ICD-10-CM | POA: Diagnosis not present

## 2021-02-27 DIAGNOSIS — I87301 Chronic venous hypertension (idiopathic) without complications of right lower extremity: Secondary | ICD-10-CM | POA: Diagnosis not present

## 2021-02-27 DIAGNOSIS — J9612 Chronic respiratory failure with hypercapnia: Secondary | ICD-10-CM | POA: Diagnosis not present

## 2021-02-27 DIAGNOSIS — D631 Anemia in chronic kidney disease: Secondary | ICD-10-CM | POA: Diagnosis not present

## 2021-02-27 DIAGNOSIS — I5033 Acute on chronic diastolic (congestive) heart failure: Secondary | ICD-10-CM | POA: Diagnosis not present

## 2021-02-27 DIAGNOSIS — N1832 Chronic kidney disease, stage 3b: Secondary | ICD-10-CM | POA: Diagnosis not present

## 2021-02-27 DIAGNOSIS — I130 Hypertensive heart and chronic kidney disease with heart failure and stage 1 through stage 4 chronic kidney disease, or unspecified chronic kidney disease: Secondary | ICD-10-CM | POA: Diagnosis not present

## 2021-02-28 ENCOUNTER — Encounter (HOSPITAL_COMMUNITY): Payer: Medicare PPO | Admitting: Internal Medicine

## 2021-02-28 DIAGNOSIS — E1151 Type 2 diabetes mellitus with diabetic peripheral angiopathy without gangrene: Secondary | ICD-10-CM | POA: Diagnosis not present

## 2021-02-28 DIAGNOSIS — I87301 Chronic venous hypertension (idiopathic) without complications of right lower extremity: Secondary | ICD-10-CM | POA: Diagnosis not present

## 2021-02-28 DIAGNOSIS — E1122 Type 2 diabetes mellitus with diabetic chronic kidney disease: Secondary | ICD-10-CM | POA: Diagnosis not present

## 2021-02-28 DIAGNOSIS — J9612 Chronic respiratory failure with hypercapnia: Secondary | ICD-10-CM | POA: Diagnosis not present

## 2021-02-28 DIAGNOSIS — I5033 Acute on chronic diastolic (congestive) heart failure: Secondary | ICD-10-CM | POA: Diagnosis not present

## 2021-02-28 DIAGNOSIS — I130 Hypertensive heart and chronic kidney disease with heart failure and stage 1 through stage 4 chronic kidney disease, or unspecified chronic kidney disease: Secondary | ICD-10-CM | POA: Diagnosis not present

## 2021-02-28 DIAGNOSIS — J9611 Chronic respiratory failure with hypoxia: Secondary | ICD-10-CM | POA: Diagnosis not present

## 2021-02-28 DIAGNOSIS — N1832 Chronic kidney disease, stage 3b: Secondary | ICD-10-CM | POA: Diagnosis not present

## 2021-02-28 DIAGNOSIS — D631 Anemia in chronic kidney disease: Secondary | ICD-10-CM | POA: Diagnosis not present

## 2021-03-04 ENCOUNTER — Telehealth: Payer: Self-pay | Admitting: Internal Medicine

## 2021-03-04 DIAGNOSIS — D631 Anemia in chronic kidney disease: Secondary | ICD-10-CM | POA: Diagnosis not present

## 2021-03-04 DIAGNOSIS — I87301 Chronic venous hypertension (idiopathic) without complications of right lower extremity: Secondary | ICD-10-CM | POA: Diagnosis not present

## 2021-03-04 DIAGNOSIS — E1122 Type 2 diabetes mellitus with diabetic chronic kidney disease: Secondary | ICD-10-CM | POA: Diagnosis not present

## 2021-03-04 DIAGNOSIS — J9612 Chronic respiratory failure with hypercapnia: Secondary | ICD-10-CM | POA: Diagnosis not present

## 2021-03-04 DIAGNOSIS — I130 Hypertensive heart and chronic kidney disease with heart failure and stage 1 through stage 4 chronic kidney disease, or unspecified chronic kidney disease: Secondary | ICD-10-CM | POA: Diagnosis not present

## 2021-03-04 DIAGNOSIS — E1151 Type 2 diabetes mellitus with diabetic peripheral angiopathy without gangrene: Secondary | ICD-10-CM | POA: Diagnosis not present

## 2021-03-04 DIAGNOSIS — N1832 Chronic kidney disease, stage 3b: Secondary | ICD-10-CM | POA: Diagnosis not present

## 2021-03-04 DIAGNOSIS — J9611 Chronic respiratory failure with hypoxia: Secondary | ICD-10-CM | POA: Diagnosis not present

## 2021-03-04 DIAGNOSIS — I5033 Acute on chronic diastolic (congestive) heart failure: Secondary | ICD-10-CM | POA: Diagnosis not present

## 2021-03-04 NOTE — Telephone Encounter (Signed)
Inbound call from patient states she have misplaced her prep instructions and would like a copy mailed to her or a call to discuss prep

## 2021-03-04 NOTE — Telephone Encounter (Signed)
I have attempted to reach the patient x 3. She has answered each time and hung up. Seems like she has an issue with her home phone and being able to hear the person on the other line. I have instead left a voicemail on a cell for patient's spouse to ask patient to call back.

## 2021-03-06 ENCOUNTER — Telehealth: Payer: Self-pay

## 2021-03-06 ENCOUNTER — Encounter (HOSPITAL_COMMUNITY): Payer: Self-pay | Admitting: Internal Medicine

## 2021-03-06 ENCOUNTER — Other Ambulatory Visit: Payer: Self-pay

## 2021-03-06 ENCOUNTER — Ambulatory Visit (HOSPITAL_COMMUNITY)
Admission: RE | Admit: 2021-03-06 | Discharge: 2021-03-06 | Disposition: A | Discharge status: Home / Self Care | Payer: Medicare PPO | Source: Ambulatory Visit | Attending: Internal Medicine | Admitting: Internal Medicine

## 2021-03-06 VITALS — BP 104/60 | HR 65

## 2021-03-06 DIAGNOSIS — E1122 Type 2 diabetes mellitus with diabetic chronic kidney disease: Secondary | ICD-10-CM | POA: Insufficient documentation

## 2021-03-06 DIAGNOSIS — E785 Hyperlipidemia, unspecified: Secondary | ICD-10-CM | POA: Diagnosis not present

## 2021-03-06 DIAGNOSIS — Z7984 Long term (current) use of oral hypoglycemic drugs: Secondary | ICD-10-CM | POA: Diagnosis not present

## 2021-03-06 DIAGNOSIS — J9611 Chronic respiratory failure with hypoxia: Secondary | ICD-10-CM | POA: Insufficient documentation

## 2021-03-06 DIAGNOSIS — Z9981 Dependence on supplemental oxygen: Secondary | ICD-10-CM | POA: Insufficient documentation

## 2021-03-06 DIAGNOSIS — N1832 Chronic kidney disease, stage 3b: Secondary | ICD-10-CM | POA: Diagnosis not present

## 2021-03-06 DIAGNOSIS — I272 Pulmonary hypertension, unspecified: Secondary | ICD-10-CM | POA: Diagnosis not present

## 2021-03-06 DIAGNOSIS — I82403 Acute embolism and thrombosis of unspecified deep veins of lower extremity, bilateral: Secondary | ICD-10-CM | POA: Diagnosis not present

## 2021-03-06 DIAGNOSIS — Z6841 Body Mass Index (BMI) 40.0 and over, adult: Secondary | ICD-10-CM | POA: Diagnosis not present

## 2021-03-06 DIAGNOSIS — I130 Hypertensive heart and chronic kidney disease with heart failure and stage 1 through stage 4 chronic kidney disease, or unspecified chronic kidney disease: Secondary | ICD-10-CM | POA: Diagnosis not present

## 2021-03-06 DIAGNOSIS — I5032 Chronic diastolic (congestive) heart failure: Secondary | ICD-10-CM | POA: Diagnosis not present

## 2021-03-06 DIAGNOSIS — Z79899 Other long term (current) drug therapy: Secondary | ICD-10-CM | POA: Insufficient documentation

## 2021-03-06 DIAGNOSIS — J45909 Unspecified asthma, uncomplicated: Secondary | ICD-10-CM | POA: Insufficient documentation

## 2021-03-06 DIAGNOSIS — Z7901 Long term (current) use of anticoagulants: Secondary | ICD-10-CM | POA: Insufficient documentation

## 2021-03-06 DIAGNOSIS — I2510 Atherosclerotic heart disease of native coronary artery without angina pectoris: Secondary | ICD-10-CM | POA: Diagnosis not present

## 2021-03-06 MED ORDER — TIRZEPATIDE 2.5 MG/0.5ML ~~LOC~~ SOAJ: 2.5000 mg | SUBCUTANEOUS | 0 refills | Status: DC

## 2021-03-06 NOTE — Telephone Encounter (Signed)
[place] - prescription sent to pharmacy - we will see if we can get it approved

## 2021-03-06 NOTE — Patient Instructions (Signed)
No changes in medications.  Your physician recommends that you schedule a follow-up appointment in: 9 months (October 2023)  ** please call the office in September for appointment**  If you have any questions or concerns before your next appointment please send [place] a message through [place] or call our office at [phone].    TO LEAVE A MESSAGE FOR THE NURSE SELECT OPTION 2, PLEASE LEAVE A MESSAGE INCLUDING: YOUR NAME DATE OF BIRTH CALL BACK NUMBER REASON FOR CALL**this is important as we prioritize the call backs  YOU WILL RECEIVE A CALL BACK THE SAME DAY AS LONG AS YOU CALL BEFORE 4:00 PM  At the [facility], you and your health needs are our priority. As part of our continuing mission to provide you with exceptional heart care, we have created designated Provider Care Teams. These Care Teams include your primary Cardiologist (physician) and Advanced Practice Providers (APPs- Physician Assistants and Nurse Practitioners) who all work together to provide you with the care you need, when you need it.   You may see any of the following providers on your designated Care Team at your next follow up: Dr Glori Bickers Dr Haynes Kerns, NP Lyda Jester, [place] [facility] [place], [place] Audry Riles, PharmD   Please be sure to bring in all your medications bottles to every appointment.

## 2021-03-06 NOTE — Telephone Encounter (Signed)
Pt is calling to advise Dr. Quay Burow that Dr. [name] ([address]) and Dr. Haroldine Laws( Cardiologist) both were in agreement that pt could start the weight loss shot.   Please advise Pt [phone]

## 2021-03-06 NOTE — Progress Notes (Signed)
Advanced Heart Failure Clinic Note   PCP: [name], MD PCP-Cardiologist: None  HF Cardiologist: Dr. Haroldine Laws  Reason for visit: f/u for chronic diastolic heart failure   HPI: Ms. Ciaravino is a 71 y.o. woman with history of morbid obesity Body mass index is 65.23 kg/m., DM2, HTN, HLD, mild-to- moderate CAD, asthma, and chronic dHF.  Cath 2/08: 50-60% LAD lesion and 30% RCA lesion.  EF 70%.  Mild pulmonary HTN primarily due to high output state.  PA mean 27 PCWP 10 PVR 1.8   Admitted 4/22 for HF Echo EF 60-65%. RV normal. She was diuresed and placed back on PO diuretics, switched from lasix 80 bid to torsemide 60 mg d/c. D/c wt 371 lb.   She saw pulmonology for OSA 10/02/20, sleep study ordered, low dose Lunesta added.  10/22 admitted with concerns of CO2 retention and HF Underwent RHC showing mild to moderate pulmonary hypertension, minimally elevated PCWP (18) and normal CO.  Sleep study 11/22 showed no OSA but + nocturnal desaturations to 84%, ONO ordered.  Today she returns for HF follow up with her husband.  Says she is "making it". Fluid up and down. Seen by Nephrology and [facility] about a month ago. SOB with mild activity. Husband providing essentially full care. No edema, orthopnea or PND. Sleeps in recliner.    Cardiac Studies: - RHC (10/22):  RA = 11 RV = 68/12 PA = 69/22 (39) PCW = 18 Fick cardiac output/index = 7.0/2.8 PVR = 3.0 WU Ao sat = 96% PA sat = 61%, 62% SVC sat 69% PAPi = 4.2  - Echo (10/22): EF 65-70%, grade I DD, RV ok - Echo 4/22: EF 60-65%. RV ok.   ROS: All systems reviewed and negative except as per HPI.   Past Medical History:  Diagnosis Date   Anemia    Asthma    CAD (coronary artery disease)    Carpal tunnel syndrome    Cellulitis of both lower extremities 04/11/2015   CHF (congestive heart failure) (HCC)    Chronic kidney disease    CKI- followed by [facility]   Colon polyp, hyperplastic 2297 & 9892   Complication of  anesthesia 1999   svt with renal calculi surgery, no problems since   CTS (carpal tunnel syndrome)    bilateral   Diabetes mellitus    Eczema    GERD (gastroesophageal reflux disease)    History of kidney stones 1999   Hyperlipidemia    Hypertension    Leg ulcer ([place]) 04/24/2015   Right lateral leg No evidence of an infection Monitor closely Keep edema controlled    Leg ulcer (HCC)    right lower   Meralgia paresthetica    Dr. [name]   Morbid obesity ([place])    Neuropathy    toes and legs   Osteoarthrosis, unspecified whether generalized or localized, lower leg    knee   PUD (peptic ulcer disease)    Shortness of breath dyspnea    with exertion   Sleep apnea    per progress note 02/25/2018   Type II or unspecified type diabetes mellitus without mention of complication, not stated as uncontrolled    Unspecified hereditary and idiopathic peripheral neuropathy    Urticaria    Vitamin B12 deficiency    Wears glasses    Wound cellulitis    right upper leg, healing well   Current Outpatient Medications  Medication Sig Dispense Refill   acetaminophen (TYLENOL) 325 MG tablet Take 2 tablets (  650 mg total) by mouth every 6 (six) hours as needed for moderate pain (or Fever >/= 101). 20 tablet 0   Alcohol Swabs (DROPSAFE ALCOHOL PREP) 70 % PADS USE TOPICALLY AS DIRECTED 300 each 3   allopurinol (ZYLOPRIM) 100 MG tablet Take 2 tablets (200 mg total) by mouth daily. 180 tablet 1   apixaban (ELIQUIS) 5 MG TABS tablet Take 1 tablet (5 mg total) by mouth 2 (two) times daily. 60 tablet 1   atorvastatin (LIPITOR) 40 MG tablet Take 1 tablet (40 mg total) by mouth at bedtime. 90 tablet 2   Blood Glucose Calibration (TRUE METRIX LEVEL 1) Low SOLN UAD  E11.9 1 each 3   Blood Glucose Monitoring Suppl (TRUE METRIX METER) DEVI True Metrix Level 1 solution     Budesonide (PULMICORT FLEXHALER) 90 MCG/ACT inhaler Inhale 1 puff into the lungs 2 (two) times daily as needed for shortness of breath or wheezing.      budesonide-formoterol (SYMBICORT) 160-4.5 MCG/ACT inhaler one - two inhalations every 12 hours; gargle and spit after use 1 Inhaler 4   calcium carbonate (TUMS - DOSED IN MG ELEMENTAL CALCIUM) 500 MG chewable tablet Chew 2 tablets by mouth daily as needed for indigestion or heartburn.     cholecalciferol (VITAMIN D3) 25 MCG (1000 UNIT) tablet Take 1 tablet (1,000 Units total) by mouth daily. 90 tablet 1   COLCHICINE PO Take 1.2 mg by mouth as needed. 2 tablets     cyanocobalamin (,VITAMIN B-12,) 1000 MCG/ML injection INJECT 1ML INTO MUSCLE EVERY 30 DAYS 10 mL 5   dapagliflozin propanediol (FARXIGA) 10 MG TABS tablet Take 10 mg by mouth daily.     diclofenac Sodium (VOLTAREN) 1 % GEL Apply topically as needed.     famotidine (PEPCID) 20 MG tablet Take 1 tablet (20 mg total) by mouth 2 (two) times daily. 180 tablet 3   FEROSUL 325 (65 Fe) MG tablet TAKE 1 TABLET BY MOUTH EVERY MONDAY, WEDNESDAY, AND FRIDAY 60 tablet 0   folic acid (FOLVITE) 177 MCG tablet Take 1 tablet (400 mcg total) by mouth at bedtime. Take 400 mcg by mouth at bedtime. (Patient taking differently: Take 400 mcg by mouth at bedtime. Take 400 mcg by mouth at bedtime.) 90 tablet 1   gabapentin (NEURONTIN) 100 MG capsule TAKE 1 CAPSULE(100 MG) BY MOUTH THREE TIMES DAILY 90 capsule 1   hydrOXYzine (ATARAX/VISTARIL) 25 MG tablet Take 1 tablet (25 mg total) by mouth 3 (three) times daily as needed for itching. 30 tablet 0   montelukast (SINGULAIR) 10 MG tablet TAKE 1 TABLET(10 MG) BY MOUTH AT BEDTIME 30 tablet 5   nebivolol (BYSTOLIC) 5 MG tablet Take 1 tablet (5 mg total) by mouth daily. 30 tablet 3   Oxcarbazepine (TRILEPTAL) 300 MG tablet Take 1 tablet (300 mg total) by mouth 2 (two) times daily. 180 tablet 1   polyethylene glycol (MIRALAX / GLYCOLAX) 17 g packet Take 17 g by mouth as needed.     potassium chloride SA (KLOR-CON M) 20 MEQ tablet Take 1 tablet (20 mEq total) by mouth daily. 90 tablet 1   senna-docusate (SENOKOT-S)  8.6-50 MG tablet Take 1 tablet by mouth at bedtime as needed for mild constipation. 30 tablet 0   spironolactone (ALDACTONE) 25 MG tablet TAKE 1 TABLET(25 MG) BY MOUTH DAILY 90 tablet 0   Torsemide 40 MG TABS Take 80 mg by mouth 2 (two) times daily. 360 tablet 2   VENTOLIN HFA 108 (90 Base) MCG/ACT inhaler  Inhale 2 puffs into the lungs every 4 (four) hours as needed for wheezing.     PEG-KCl-NaCl-NaSulf-Na Asc-C (PLENVU) 140 g SOLR Take 1 kit by mouth as directed. Use coupon: BIN: [id number] PNC: CNRX Group: [id number] ID: [id number] (Patient not taking: Reported on 03/06/2021) 1 each 0   Semaglutide,0.25 or 0.5MG/DOS, (OZEMPIC, 0.25 OR 0.5 MG/DOSE,) 2 MG/1.5ML SOPN Inject 0.25 mg into the skin once a week for 30 days, THEN 0.5 mg once a week. (Patient not taking: Reported on 03/06/2021) 2 mL 5   No current facility-administered medications for this encounter.   Allergies  Allergen Reactions   Penicillins Rash and Other (See Comments)    She was told not to take it anymore. Has patient had a PCN reaction causing immediate rash, facial/tongue/throat swelling, SOB or lightheadedness with hypotension: Yes Has patient had a PCN reaction causing severe rash involving mucus membranes or skin necrosis: No Has patient had a PCN reaction that required hospitalization: No Has patient had a PCN reaction occurring within the last 10 years: No If all of the above answers are "NO", then may proceed with Cephalosporin use.'   Shellfish Allergy Anaphylaxis   Sulfonamide Derivatives Anaphylaxis and Other (See Comments)   Hydrochlorothiazide W-Triamterene Other (See Comments)    Hypokalemia   Lotensin [Benazepril Hcl] Hives   Dipyridamole Other (See Comments)    Unknown reaction   Estrogens Other (See Comments)    Unknown reaction   Hydrochlorothiazide Other (See Comments)    Unknown reaction   Latex Other (See Comments)    Unknown reaction - stated previously during surgery gloves had to be changed, but unsure  if it is still an allergy or not.    Metronidazole Other (See Comments)    Unknown reaction   Other Itching    PICKLES   Spironolactone Other (See Comments)    UNSPECIFIED > "kidney problems"   Sulfa Antibiotics Other (See Comments)    Unknown reaction   Torsemide Other (See Comments)    Unknown reaction   Valsartan Other (See Comments)    Unknown reaction   Mustard [Allyl Isothiocyanate] Itching   Social History   Socioeconomic History   Marital status: Married    Spouse name: Dwight   Number of children: 1   Years of education: BS   Highest education level: Not on file  Occupational History   Occupation: Disabled    Employer: RETIRED  Tobacco Use   Smoking status: Never   Smokeless tobacco: Never  Vaping Use   Vaping Use: Never used  Substance and Sexual Activity   Alcohol use: No    Alcohol/week: 0.0 standard drinks   Drug use: No   Sexual activity: Not Currently  Other Topics Concern   Not on file  Social History Narrative   Patient lives at home with her husband [name] Patient is retired and has a Conservation officer, nature.    Caffeine - some times.   Right handed.   Social Determinants of Health   Financial Resource Strain: Not on file  Food Insecurity: Not on file  Transportation Needs: Not on file  Physical Activity: Not on file  Stress: Not on file  Social Connections: Not on file  Intimate Partner Violence: Not on file   Family History  Problem Relation Age of Onset   Colon cancer Mother    Prostate cancer Father    Colon cancer Father    Diabetes Maternal Aunt    Breast cancer Maternal Aunt  Diabetes Maternal Uncle    Diabetes Paternal Aunt    Stroke Paternal Aunt        > 34   Heart disease Paternal Aunt    Diabetes Paternal Uncle    Breast cancer Maternal Aunt         X 2   Breast cancer Cousin    BP 104/60    Pulse 65    SpO2 91%   Wt Readings from Last 3 Encounters:  01/14/21 (!) 164.2 kg (362 lb)  01/10/21 (!) 164.2 kg (362  lb)  01/03/21 (!) 159.2 kg (351 lb)   PHYSICAL EXAM: General:  NAD. No resp difficulty, arrived in WC, 1L oxygen [place] placed in clinic HEENT: normal Neck: supple. no JVD. Carotids 2+ bilat; no bruits. No lymphadenopathy or thryomegaly appreciated. EML:JQGBEEF rate & rhythm. No rubs, gallops or murmurs. Lungs: clear Abdomen: markedly obese soft, nontender, nondistended. Good bowel sounds. Extremities: no cyanosis, clubbing, rash, edema + compression wraps Neuro: alert & orientedx3, cranial nerves grossly intact. moves all 4 extremities w/o difficulty. Affect pleasant  ASSESSMENT & PLAN:  1. Chronic Hypoxic Respiratory Failure - Suspect driven by OHS, split night sleep study showed no OSA - Continue BiPAP qhs & continue oxygen during the day. - RHC (10/22): c/w mid-mod PH.  - Respiratory status stable   2. Chronic Diastolic Heart Failure  - RHC 2008 c/w mild pulmonary hypertension, primarily due to high output state.  Her mean pulmonary artery pressure was 27, wedge pressure was 10. PVR was 1.80 [place] units. - Echo (2017): LVEF 65-70%, G1DD, RV normal - Recent admit for a/c dCHF. Volume improved w/ IV Lasix.  - Echo (4/22): LVEF 60-65%, G2DD  - Echo (10/22): EF 65%, Grade 1 DD, RV normal. - RHC (10/22): w/ mild-mod PH, minimally elevated PCWP (18) - NYHA III, functional status difficult due to body habitus, asthma/chronic resp failure, and physical deconditioning.  - Volume status looks good. Continue torsemide - Continue Farxiga - Continue spiro 25 mg daily.  - Continue Bystolic 5 mg daily    3. Bilateral LE DVT (Diagnosed 4/22) - Confirmed by venous dopplers. Chest CT negative for PE. - Now on Eliquis. No bleeding issues.   4. CAD:  - LHC in 2008 showed 50-60% LAD lesion and 30% RCA lesion. - No  s/s angina - Continue ? blocker and statin. - No ASA given Eliquis for DVT tx.    6. Stage IIIb CKD - Baseline SCr ~1.5. Labs stable 12/22 - Followed by Dr. [name] at  [facility].   Glori Bickers, MD  11:26 AM

## 2021-03-07 ENCOUNTER — Ambulatory Visit: Payer: Medicare PPO

## 2021-03-07 DIAGNOSIS — E1122 Type 2 diabetes mellitus with diabetic chronic kidney disease: Secondary | ICD-10-CM | POA: Diagnosis not present

## 2021-03-07 DIAGNOSIS — J9611 Chronic respiratory failure with hypoxia: Secondary | ICD-10-CM | POA: Diagnosis not present

## 2021-03-07 DIAGNOSIS — N1832 Chronic kidney disease, stage 3b: Secondary | ICD-10-CM | POA: Diagnosis not present

## 2021-03-07 DIAGNOSIS — I130 Hypertensive heart and chronic kidney disease with heart failure and stage 1 through stage 4 chronic kidney disease, or unspecified chronic kidney disease: Secondary | ICD-10-CM | POA: Diagnosis not present

## 2021-03-07 DIAGNOSIS — J9612 Chronic respiratory failure with hypercapnia: Secondary | ICD-10-CM | POA: Diagnosis not present

## 2021-03-07 DIAGNOSIS — E1151 Type 2 diabetes mellitus with diabetic peripheral angiopathy without gangrene: Secondary | ICD-10-CM | POA: Diagnosis not present

## 2021-03-07 DIAGNOSIS — I5033 Acute on chronic diastolic (congestive) heart failure: Secondary | ICD-10-CM | POA: Diagnosis not present

## 2021-03-07 DIAGNOSIS — I87301 Chronic venous hypertension (idiopathic) without complications of right lower extremity: Secondary | ICD-10-CM | POA: Diagnosis not present

## 2021-03-07 DIAGNOSIS — D631 Anemia in chronic kidney disease: Secondary | ICD-10-CM | POA: Diagnosis not present

## 2021-03-10 ENCOUNTER — Telehealth: Payer: Self-pay | Admitting: Internal Medicine

## 2021-03-10 NOTE — Telephone Encounter (Signed)
Pt requesting a c/b for instructions on how to inject tirzepatide [facility]) 2.5 MG/0.5ML Pen

## 2021-03-11 ENCOUNTER — Encounter (HOSPITAL_COMMUNITY): Payer: Self-pay | Admitting: Internal Medicine

## 2021-03-11 DIAGNOSIS — J9612 Chronic respiratory failure with hypercapnia: Secondary | ICD-10-CM | POA: Diagnosis not present

## 2021-03-11 DIAGNOSIS — N1832 Chronic kidney disease, stage 3b: Secondary | ICD-10-CM | POA: Diagnosis not present

## 2021-03-11 DIAGNOSIS — I87301 Chronic venous hypertension (idiopathic) without complications of right lower extremity: Secondary | ICD-10-CM | POA: Diagnosis not present

## 2021-03-11 DIAGNOSIS — E1122 Type 2 diabetes mellitus with diabetic chronic kidney disease: Secondary | ICD-10-CM | POA: Diagnosis not present

## 2021-03-11 DIAGNOSIS — E1151 Type 2 diabetes mellitus with diabetic peripheral angiopathy without gangrene: Secondary | ICD-10-CM | POA: Diagnosis not present

## 2021-03-11 DIAGNOSIS — I5033 Acute on chronic diastolic (congestive) heart failure: Secondary | ICD-10-CM | POA: Diagnosis not present

## 2021-03-11 DIAGNOSIS — D631 Anemia in chronic kidney disease: Secondary | ICD-10-CM | POA: Diagnosis not present

## 2021-03-11 DIAGNOSIS — I130 Hypertensive heart and chronic kidney disease with heart failure and stage 1 through stage 4 chronic kidney disease, or unspecified chronic kidney disease: Secondary | ICD-10-CM | POA: Diagnosis not present

## 2021-03-11 DIAGNOSIS — J9611 Chronic respiratory failure with hypoxia: Secondary | ICD-10-CM | POA: Diagnosis not present

## 2021-03-11 NOTE — Telephone Encounter (Signed)
Spoke with patient today. 

## 2021-03-13 DIAGNOSIS — N1832 Chronic kidney disease, stage 3b: Secondary | ICD-10-CM | POA: Diagnosis not present

## 2021-03-13 DIAGNOSIS — J9612 Chronic respiratory failure with hypercapnia: Secondary | ICD-10-CM | POA: Diagnosis not present

## 2021-03-13 DIAGNOSIS — I87301 Chronic venous hypertension (idiopathic) without complications of right lower extremity: Secondary | ICD-10-CM | POA: Diagnosis not present

## 2021-03-13 DIAGNOSIS — E1122 Type 2 diabetes mellitus with diabetic chronic kidney disease: Secondary | ICD-10-CM | POA: Diagnosis not present

## 2021-03-13 DIAGNOSIS — D631 Anemia in chronic kidney disease: Secondary | ICD-10-CM | POA: Diagnosis not present

## 2021-03-13 DIAGNOSIS — E1151 Type 2 diabetes mellitus with diabetic peripheral angiopathy without gangrene: Secondary | ICD-10-CM | POA: Diagnosis not present

## 2021-03-13 DIAGNOSIS — J9611 Chronic respiratory failure with hypoxia: Secondary | ICD-10-CM | POA: Diagnosis not present

## 2021-03-13 DIAGNOSIS — I130 Hypertensive heart and chronic kidney disease with heart failure and stage 1 through stage 4 chronic kidney disease, or unspecified chronic kidney disease: Secondary | ICD-10-CM | POA: Diagnosis not present

## 2021-03-13 DIAGNOSIS — I5033 Acute on chronic diastolic (congestive) heart failure: Secondary | ICD-10-CM | POA: Diagnosis not present

## 2021-03-14 ENCOUNTER — Other Ambulatory Visit: Payer: Self-pay

## 2021-03-14 ENCOUNTER — Ambulatory Visit: Payer: Medicare PPO | Admitting: Family

## 2021-03-14 DIAGNOSIS — I872 Venous insufficiency (chronic) (peripheral): Secondary | ICD-10-CM | POA: Diagnosis not present

## 2021-03-14 DIAGNOSIS — M7989 Other specified soft tissue disorders: Secondary | ICD-10-CM | POA: Diagnosis not present

## 2021-03-17 ENCOUNTER — Encounter: Payer: Self-pay | Admitting: Internal Medicine

## 2021-03-17 DIAGNOSIS — I130 Hypertensive heart and chronic kidney disease with heart failure and stage 1 through stage 4 chronic kidney disease, or unspecified chronic kidney disease: Secondary | ICD-10-CM | POA: Diagnosis not present

## 2021-03-17 DIAGNOSIS — I5033 Acute on chronic diastolic (congestive) heart failure: Secondary | ICD-10-CM | POA: Diagnosis not present

## 2021-03-17 DIAGNOSIS — J9611 Chronic respiratory failure with hypoxia: Secondary | ICD-10-CM | POA: Diagnosis not present

## 2021-03-17 DIAGNOSIS — I87301 Chronic venous hypertension (idiopathic) without complications of right lower extremity: Secondary | ICD-10-CM | POA: Diagnosis not present

## 2021-03-17 DIAGNOSIS — J9612 Chronic respiratory failure with hypercapnia: Secondary | ICD-10-CM | POA: Diagnosis not present

## 2021-03-17 DIAGNOSIS — E1151 Type 2 diabetes mellitus with diabetic peripheral angiopathy without gangrene: Secondary | ICD-10-CM | POA: Diagnosis not present

## 2021-03-17 DIAGNOSIS — D631 Anemia in chronic kidney disease: Secondary | ICD-10-CM | POA: Diagnosis not present

## 2021-03-17 DIAGNOSIS — E1122 Type 2 diabetes mellitus with diabetic chronic kidney disease: Secondary | ICD-10-CM | POA: Diagnosis not present

## 2021-03-17 DIAGNOSIS — N1832 Chronic kidney disease, stage 3b: Secondary | ICD-10-CM | POA: Diagnosis not present

## 2021-03-17 NOTE — Progress Notes (Signed)
Outside notes received. Information abstracted. Notes sent to scan.  

## 2021-03-18 ENCOUNTER — Telehealth: Payer: Self-pay | Admitting: Pulmonary Disease

## 2021-03-19 ENCOUNTER — Observation Stay (HOSPITAL_COMMUNITY)
Admission: AD | Admit: 2021-03-19 | Discharge: 2021-03-20 | Disposition: A | Discharge status: Home / Self Care | Payer: Medicare PPO | Source: Ambulatory Visit | Attending: Internal Medicine | Admitting: Internal Medicine

## 2021-03-19 ENCOUNTER — Telehealth: Payer: Self-pay

## 2021-03-19 ENCOUNTER — Telehealth: Payer: Self-pay | Admitting: Internal Medicine

## 2021-03-19 ENCOUNTER — Encounter: Payer: Self-pay | Admitting: Family

## 2021-03-19 DIAGNOSIS — K222 Esophageal obstruction: Secondary | ICD-10-CM | POA: Diagnosis not present

## 2021-03-19 DIAGNOSIS — Z800 Family history of malignant neoplasm of digestive organs: Secondary | ICD-10-CM | POA: Insufficient documentation

## 2021-03-19 DIAGNOSIS — Z86718 Personal history of other venous thrombosis and embolism: Secondary | ICD-10-CM | POA: Diagnosis not present

## 2021-03-19 DIAGNOSIS — I130 Hypertensive heart and chronic kidney disease with heart failure and stage 1 through stage 4 chronic kidney disease, or unspecified chronic kidney disease: Secondary | ICD-10-CM | POA: Insufficient documentation

## 2021-03-19 DIAGNOSIS — Z1211 Encounter for screening for malignant neoplasm of colon: Secondary | ICD-10-CM | POA: Diagnosis not present

## 2021-03-19 DIAGNOSIS — Z79899 Other long term (current) drug therapy: Secondary | ICD-10-CM | POA: Diagnosis not present

## 2021-03-19 DIAGNOSIS — N1832 Chronic kidney disease, stage 3b: Secondary | ICD-10-CM | POA: Diagnosis not present

## 2021-03-19 DIAGNOSIS — J45909 Unspecified asthma, uncomplicated: Secondary | ICD-10-CM | POA: Diagnosis not present

## 2021-03-19 DIAGNOSIS — K529 Noninfective gastroenteritis and colitis, unspecified: Secondary | ICD-10-CM

## 2021-03-19 DIAGNOSIS — Z6841 Body Mass Index (BMI) 40.0 and over, adult: Secondary | ICD-10-CM | POA: Diagnosis not present

## 2021-03-19 DIAGNOSIS — I2510 Atherosclerotic heart disease of native coronary artery without angina pectoris: Secondary | ICD-10-CM | POA: Diagnosis not present

## 2021-03-19 DIAGNOSIS — K5140 Inflammatory polyps of colon without complications: Secondary | ICD-10-CM | POA: Insufficient documentation

## 2021-03-19 DIAGNOSIS — I509 Heart failure, unspecified: Secondary | ICD-10-CM | POA: Insufficient documentation

## 2021-03-19 DIAGNOSIS — K648 Other hemorrhoids: Secondary | ICD-10-CM | POA: Diagnosis not present

## 2021-03-19 DIAGNOSIS — K449 Diaphragmatic hernia without obstruction or gangrene: Secondary | ICD-10-CM | POA: Insufficient documentation

## 2021-03-19 DIAGNOSIS — Z20822 Contact with and (suspected) exposure to covid-19: Secondary | ICD-10-CM | POA: Diagnosis not present

## 2021-03-19 DIAGNOSIS — K2970 Gastritis, unspecified, without bleeding: Secondary | ICD-10-CM | POA: Insufficient documentation

## 2021-03-19 DIAGNOSIS — K219 Gastro-esophageal reflux disease without esophagitis: Secondary | ICD-10-CM | POA: Insufficient documentation

## 2021-03-19 DIAGNOSIS — E1122 Type 2 diabetes mellitus with diabetic chronic kidney disease: Secondary | ICD-10-CM | POA: Diagnosis not present

## 2021-03-19 DIAGNOSIS — Z7901 Long term (current) use of anticoagulants: Secondary | ICD-10-CM | POA: Insufficient documentation

## 2021-03-19 DIAGNOSIS — D122 Benign neoplasm of ascending colon: Secondary | ICD-10-CM

## 2021-03-19 DIAGNOSIS — Z91040 Latex allergy status: Secondary | ICD-10-CM | POA: Diagnosis not present

## 2021-03-19 LAB — CBC WITH DIFFERENTIAL/PLATELET
Abs Immature Granulocytes: 0.03 10*3/uL (ref 0.00–0.07)
Basophils Absolute: 0.1 10*3/uL (ref 0.0–0.1)
Basophils Relative: 1 %
Eosinophils Absolute: 0.1 10*3/uL (ref 0.0–0.5)
Eosinophils Relative: 2 %
HCT: 39 % (ref 36.0–46.0)
Hemoglobin: 12 g/dL (ref 12.0–15.0)
Immature Granulocytes: 1 %
Lymphocytes Relative: 26 %
Lymphs Abs: 1.3 10*3/uL (ref 0.7–4.0)
MCH: 31.2 pg (ref 26.0–34.0)
MCHC: 30.8 g/dL (ref 30.0–36.0)
MCV: 101.3 fL — ABNORMAL HIGH (ref 80.0–100.0)
Monocytes Absolute: 0.4 10*3/uL (ref 0.1–1.0)
Monocytes Relative: 8 %
Neutro Abs: 3.2 10*3/uL (ref 1.7–7.7)
Neutrophils Relative %: 62 %
Platelets: 173 10*3/uL (ref 150–400)
RBC: 3.85 MIL/uL — ABNORMAL LOW (ref 3.87–5.11)
RDW: 15.9 % — ABNORMAL HIGH (ref 11.5–15.5)
WBC: 5.1 10*3/uL (ref 4.0–10.5)
nRBC: 1 % — ABNORMAL HIGH (ref 0.0–0.2)

## 2021-03-19 LAB — COMPREHENSIVE METABOLIC PANEL
ALT: 17 U/L (ref 0–44)
AST: 18 U/L (ref 15–41)
Albumin: 3.9 g/dL (ref 3.5–5.0)
Alkaline Phosphatase: 89 U/L (ref 38–126)
Anion gap: 8 (ref 5–15)
BUN: 16 mg/dL (ref 8–23)
CO2: 34 mmol/L — ABNORMAL HIGH (ref 22–32)
Calcium: 8.9 mg/dL (ref 8.9–10.3)
Chloride: 93 mmol/L — ABNORMAL LOW (ref 98–111)
Creatinine, Ser: 1.56 mg/dL — ABNORMAL HIGH (ref 0.44–1.00)
GFR, Estimated: 36 mL/min — ABNORMAL LOW (ref >60)
Glucose, Bld: 86 mg/dL (ref 70–99)
Potassium: 3.8 mmol/L (ref 3.5–5.1)
Sodium: 135 mmol/L (ref 135–145)
Total Bilirubin: 0.6 mg/dL (ref 0.3–1.2)
Total Protein: 7.2 g/dL (ref 6.5–8.1)

## 2021-03-19 LAB — RESP PANEL BY RT-PCR (FLU A&B, COVID) ARPGX2
Influenza A by PCR: NEGATIVE
Influenza B by PCR: NEGATIVE
SARS Coronavirus 2 by RT PCR: NEGATIVE

## 2021-03-19 MED ORDER — ACETAMINOPHEN 325 MG PO TABS: 650.0000 mg | ORAL_TABLET | ORAL | Status: DC | PRN

## 2021-03-19 MED ORDER — OXCARBAZEPINE 300 MG/5ML PO SUSP
300.0000 mg | Freq: Two times a day (BID) | ORAL | Status: DC
  Filled 2021-03-19: qty 5

## 2021-03-19 MED ORDER — PEG-KCL-NACL-NASULF-NA ASC-C 100 G PO SOLR
0.5000 | Freq: Once | ORAL | Status: AC
  Administered 2021-03-19: 100 g via ORAL

## 2021-03-19 MED ORDER — ALLOPURINOL 100 MG PO TABS
100.0000 mg | ORAL_TABLET | Freq: Two times a day (BID) | ORAL | Status: DC
  Administered 2021-03-19: 100 mg via ORAL
  Filled 2021-03-19: qty 1

## 2021-03-19 MED ORDER — ALBUTEROL SULFATE (2.5 MG/3ML) 0.083% IN NEBU: 2.5000 mg | INHALATION_SOLUTION | RESPIRATORY_TRACT | Status: DC | PRN

## 2021-03-19 MED ORDER — POTASSIUM CHLORIDE 20 MEQ PO PACK
20.0000 meq | PACK | Freq: Every day | ORAL | Status: DC
  Administered 2021-03-19 – 2021-03-20 (×2): 20 meq via ORAL
  Filled 2021-03-19 (×2): qty 1

## 2021-03-19 MED ORDER — SPIRONOLACTONE 25 MG PO TABS
25.0000 mg | ORAL_TABLET | Freq: Every day | ORAL | Status: DC
  Administered 2021-03-19 – 2021-03-20 (×2): 25 mg via ORAL
  Filled 2021-03-19 (×2): qty 1

## 2021-03-19 MED ORDER — MONTELUKAST SODIUM 10 MG PO TABS
10.0000 mg | ORAL_TABLET | Freq: Every day | ORAL | Status: DC
  Administered 2021-03-19: 10 mg via ORAL
  Filled 2021-03-19: qty 1

## 2021-03-19 MED ORDER — BISACODYL 5 MG PO TBEC
20.0000 mg | DELAYED_RELEASE_TABLET | Freq: Once | ORAL | Status: AC
  Administered 2021-03-19: 20 mg via ORAL
  Filled 2021-03-19: qty 4

## 2021-03-19 MED ORDER — GABAPENTIN 100 MG PO CAPS
100.0000 mg | ORAL_CAPSULE | Freq: Three times a day (TID) | ORAL | Status: DC
  Administered 2021-03-19 – 2021-03-20 (×3): 100 mg via ORAL
  Filled 2021-03-19 (×3): qty 1

## 2021-03-19 MED ORDER — OXCARBAZEPINE 300 MG PO TABS
300.0000 mg | ORAL_TABLET | Freq: Two times a day (BID) | ORAL | Status: DC
  Administered 2021-03-19 – 2021-03-20 (×2): 300 mg via ORAL
  Filled 2021-03-19 (×2): qty 1

## 2021-03-19 MED ORDER — NEBIVOLOL HCL 10 MG PO TABS
5.0000 mg | ORAL_TABLET | Freq: Every day | ORAL | Status: DC
  Administered 2021-03-19 – 2021-03-20 (×2): 5 mg via ORAL
  Filled 2021-03-19 (×2): qty 1

## 2021-03-19 MED ORDER — MOMETASONE FURO-FORMOTEROL FUM 200-5 MCG/ACT IN AERO
2.0000 | INHALATION_SPRAY | Freq: Two times a day (BID) | RESPIRATORY_TRACT | Status: DC
  Administered 2021-03-19: 2 via RESPIRATORY_TRACT
  Filled 2021-03-19: qty 8.8

## 2021-03-19 MED ORDER — FAMOTIDINE 20 MG PO TABS
20.0000 mg | ORAL_TABLET | Freq: Two times a day (BID) | ORAL | Status: DC
  Administered 2021-03-19: 20 mg via ORAL
  Filled 2021-03-19 (×2): qty 1

## 2021-03-19 MED ORDER — PEG-KCL-NACL-NASULF-NA ASC-C 100 G PO SOLR
0.5000 | Freq: Once | ORAL | Status: AC
  Administered 2021-03-19: 100 g via ORAL
  Filled 2021-03-19: qty 1

## 2021-03-19 MED ORDER — PEG-KCL-NACL-NASULF-NA ASC-C 100 G PO SOLR: 1.0000 | Freq: Once | ORAL | Status: DC

## 2021-03-19 MED ORDER — BUDESONIDE 0.5 MG/2ML IN SUSP: 0.5000 mg | Freq: Two times a day (BID) | RESPIRATORY_TRACT | Status: DC | PRN

## 2021-03-19 MED ORDER — ATORVASTATIN CALCIUM 40 MG PO TABS
40.0000 mg | ORAL_TABLET | Freq: Every day | ORAL | Status: DC
  Administered 2021-03-19 – 2021-03-20 (×2): 40 mg via ORAL
  Filled 2021-03-19 (×2): qty 1

## 2021-03-19 MED ORDER — DAPAGLIFLOZIN PROPANEDIOL 10 MG PO TABS
10.0000 mg | ORAL_TABLET | Freq: Every day | ORAL | Status: DC
Start: 2021-03-19 — End: 2021-03-19

## 2021-03-19 MED ORDER — TORSEMIDE 20 MG PO TABS
80.0000 mg | ORAL_TABLET | Freq: Two times a day (BID) | ORAL | Status: DC
  Administered 2021-03-19: 80 mg via ORAL
  Filled 2021-03-19 (×3): qty 4

## 2021-03-19 NOTE — Telephone Encounter (Signed)
Per Dr. Hilarie Fredrickson, I reached out to the patient to inform her that if we do not have a bed for her by 4:30 pm, we will need to cancel the scheduled colonoscopy for now. Pt. states clear understanding to continue clear liquids for now and if the hospital calls with a bed before 4:30 then report as directed. If we end up not getting bed placement she will restart her Eliquis.

## 2021-03-19 NOTE — Progress Notes (Signed)
Office Visit Note   Patient: April [name]           Date of Birth: 08/06/1950           MRN: [id number] Visit Date: 03/14/2021              Requested by: [name], MD [place],  [place] [address] PCP: [name], MD  Chief Complaint  Patient presents with   Right Leg - Follow-up   Left Leg - Follow-up      HPI: The patient is a 71 year old woman who presents today routine follow-up.  Seen for bilateral lower extremity edema combination of venous and lymphatic insufficiency.  She has been using her lymphedema pumps as well as her CircAid compression wraps throughout the day.  She states that they are working well for the distal aspect of her legs but continue to be pushed down.  She has good control of the edema distally unfortunately it is quite significant edema proximally.  Her home health nurse has recommended she be fitted for new smaller CircAid wraps    Assessment & Plan: Visit Diagnoses:  No diagnosis found.   Plan: Home health nursing for assistance with compression garments during the day.  Lymphedema pumps at night.  Have measured her for new CircAid's today.  Follow-Up Instructions: Return in about 3 months (around 06/11/2021).   Ortho Exam  Patient is alert, oriented, no adenopathy, well-dressed, normal affect, normal respiratory effort. Examination the distal aspect of both legs has excellent decreased edema there are no open ulcers no cellulitis no drainage.  The proximal half of the calf has significant venous and lymphatic insufficiency.  Imaging: No results found. No images are attached to the encounter.  Labs: Lab Results  Component Value Date   HGBA1C 6.4 11/29/2020   HGBA1C 6.0 (A) 09/13/2020   HGBA1C 6.0 09/13/2020   HGBA1C 6.0 09/13/2020   HGBA1C 6.0 09/13/2020   ESRSEDRATE 13 05/08/2020   ESRSEDRATE 42 (H) 08/12/2018   ESRSEDRATE 56 (H) 03/09/2018   CRP 1.6 (H) 02/04/2019   CRP 1.5 (H) 12/10/2018   CRP 2.1 (H)  12/09/2018   LABURIC 11.1 (H) 02/04/2019   LABURIC 10.5 (H) 09/13/2018   LABURIC 11.0 (H) 09/11/2018   REPTSTATUS 11/21/2020 FINAL 11/20/2020   GRAMSTAIN  05/27/2018    FEW WBC PRESENT, PREDOMINANTLY PMN MODERATE GRAM POSITIVE RODS RARE GRAM POSITIVE COCCI IN PAIRS    CULT MULTIPLE SPECIES PRESENT, SUGGEST RECOLLECTION (A) 11/20/2020   LABORGA CITROBACTER FREUNDII (A) 02/15/2019   LABORGA PSEUDOMONAS AERUGINOSA (A) 02/15/2019     Lab Results  Component Value Date   ALBUMIN 3.9 12/31/2020   ALBUMIN 3.5 12/10/2020   ALBUMIN 4.0 11/29/2020    Lab Results  Component Value Date   MG 2.4 11/20/2020   MG 2.3 11/19/2020   MG 2.3 11/18/2020   Lab Results  Component Value Date   VD25OH 47.2 06/02/2016   VD25OH 42 01/23/2011    No results found for: PREALBUMIN CBC EXTENDED Latest Ref Rng & Units 12/31/2020 12/17/2020 12/10/2020  WBC 4.0 - 10.5 K/uL 5.1 5.3 5.7  RBC 3.87 - 5.11 MIL/uL 3.99 3.84(L) 3.78(L)  HGB 12.0 - 15.0 g/dL 12.2 11.8(L) 11.4(L)  HCT 36.0 - 46.0 % 39.5 38.6 37.3  PLT 150 - 400 K/uL 176 110(L) 111(L)  NEUTROABS 1.7 - 7.7 K/uL 3.5 3.5 3.8  LYMPHSABS 0.7 - 4.0 K/uL 1.0 1.1 1.2     There is no height or  weight on file to calculate BMI.  Orders:  No orders of the defined types were placed in this encounter.  No orders of the defined types were placed in this encounter.    Procedures: No procedures performed  Clinical Data: No additional findings.  ROS:  All other systems negative, except as noted in the HPI. Review of Systems  Constitutional:  Negative for chills and fever.  Cardiovascular:  Positive for leg swelling.  Skin:  Negative for color change and wound.   Objective: Vital Signs: There were no vitals taken for this visit.  Specialty Comments:  No specialty comments available.  PMFS History: Patient Active Problem List   Diagnosis Date Noted   Nocturnal hypoxemia due to obesity 01/03/2021   Thrombocytopenia ([place]) 12/12/2020    Frequent nosebleeds 11/29/2020   Obesity hypoventilation syndrome ([place]) 11/15/2020   History of DVT (deep vein thrombosis) 11/15/2020   Encounter for chronic pain management 09/05/2020   Cellulitis, leg 05/08/2020   Leg DVT (deep venous thromboembolism), acute, bilateral ([place]) 05/08/2020   SOB (shortness of breath)    Gout    Morbid obesity ([place])    Pneumonia due to COVID-19 virus 12/10/2018   COVID-19 virus infection 12/06/2018   Right hip pain 11/09/2018   Osteomyelitis of ankle or foot, acute, left ([place]) 09/07/2018   Onychomycosis 05/19/2018   Idiopathic chronic venous hypertension of right lower extremity with ulcer and inflammation ([place]) 05/19/2018   Skin lumps 01/14/2018   Bilateral leg cramps 12/14/2017   Left shoulder pain 08/14/2016   Meralgia paresthetica 07/16/2016   Chronic left-sided low back pain with left-sided sciatica 06/02/2016   Osteopenia 01/11/2016   CKD (chronic kidney disease) stage 3, GFR 30-59 ml/min (HCC) 01/02/2016   Carpal tunnel syndrome 06/07/2015   Family history of colon cancer    Diabetes mellitus with neurological manifestations ([place]) 04/18/2015   Bilateral leg edema 04/11/2015   Abnormality of gait 01/03/2015   Hereditary and idiopathic peripheral neuropathy 01/03/2015   GERD (gastroesophageal reflux disease) 06/17/2014   Hx of colonic polyps 12/15/2012   Vitamin B12 deficiency 11/03/2012   Intrinsic asthma 03/23/2012   Chronic diastolic (congestive) heart failure ([place]) 02/20/2011   OSA (obstructive sleep apnea) 09/17/2010   CAD, NATIVE VESSEL 11/20/2008   Osteoarthritis of knees, bilateral 06/14/2008   Hyperlipidemia 05/10/2007   Essential hypertension 01/18/2007   Hypokalemia 04/30/2006   HX, PERSONAL, PEPTIC ULCER DISEASE 04/30/2006   Past Medical History:  Diagnosis Date   Anemia    Asthma    CAD (coronary artery disease)    Carpal tunnel syndrome    Cellulitis of both lower extremities 04/11/2015   CHF (congestive heart failure)  ([place])    Chronic kidney disease    CKI- followed by [facility]   Colon polyp, hyperplastic 2423 & 5361   Complication of anesthesia 1999   svt with renal calculi surgery, no problems since   CTS (carpal tunnel syndrome)    bilateral   Diabetes mellitus    Eczema    GERD (gastroesophageal reflux disease)    History of kidney stones 1999   Hyperlipidemia    Hypertension    Leg ulcer ([place]) 04/24/2015   Right lateral leg No evidence of an infection Monitor closely Keep edema controlled    Leg ulcer ([place])    right lower   Meralgia paresthetica    Dr. [name]   Morbid obesity ([place])    Neuropathy    toes and legs   Osteoarthrosis, unspecified whether generalized or  localized, lower leg    knee   PUD (peptic ulcer disease)    Shortness of breath dyspnea    with exertion   Sleep apnea    per progress note 02/25/2018   Type II or unspecified type diabetes mellitus without mention of complication, not stated as uncontrolled    Unspecified hereditary and idiopathic peripheral neuropathy    Urticaria    Vitamin B12 deficiency    Wears glasses    Wound cellulitis    right upper leg, healing well    Family History  Problem Relation Age of Onset   Colon cancer Mother    Prostate cancer Father    Colon cancer Father    Diabetes Maternal Aunt    Breast cancer Maternal Aunt    Diabetes Maternal Uncle    Diabetes Paternal Aunt    Stroke Paternal Aunt        > 33   Heart disease Paternal Aunt    Diabetes Paternal Uncle    Breast cancer Maternal Aunt         X 2   Breast cancer Cousin     Past Surgical History:  Procedure Laterality Date   ABDOMINAL HYSTERECTOMY     BREAST BIOPSY Right 07/08/2018   CARDIAC CATHETERIZATION  2002   non obstructive disease   colonoscopy with polypectomy  2007 & 2012    hyperplastic ;Dr Watt Climes   COLONOSCOPY WITH PROPOFOL N/A 06/04/2015   Procedure: COLONOSCOPY WITH PROPOFOL;  Surgeon: Jerene Bears, MD;  Location: [facility];  Service:  Gastroenterology;  Laterality: N/A;   DEBRIDEMENT LEG Right 03/02/2018   WOUND VAC APPLIED   DEBRIDEMENT LEG Right 05/27/2018   RIGHT LOWER LEG DEBRIDEMENT,  SKIN GRAFT, VAC PLACEMENT    DILATION AND CURETTAGE OF UTERUS     multiple   HEMORRHOID SURGERY     HEMORROIDECTOMY     I & D EXTREMITY Right 03/02/2018   Procedure: RIGHT LEG DEBRIDEMENT AND PLACE VAC;  Surgeon: [name], MD;  Location: [facility];  Service: Orthopedics;  Laterality: Right;   I & D EXTREMITY Right 03/04/2018   Procedure: REPEAT IRRIGATION AND DEBRIDEMENT RIGHT LEG, PLACE WOUND VAC;  Surgeon: [name], MD;  Location: [facility];  Service: Orthopedics;  Laterality: Right;   I & D EXTREMITY Right 03/30/2018   Procedure: IRRIGATION AND DEBRIDEMENT RIGHT LEG, APPLY WOUND VAC;  Surgeon: [name], MD;  Location: [facility];  Service: Orthopedics;  Laterality: Right;   I & D EXTREMITY Right 05/27/2018   Procedure: RIGHT LOWER LEG DEBRIDEMENT,  SKIN GRAFT, VAC PLACEMENT;  Surgeon: [name], MD;  Location: [facility];  Service: Orthopedics;  Laterality: Right;  RIGHT LOWER LEG DEBRIDEMENT,  SKIN GRAFT, VAC PLACEMENT   renal calculi  12/1997   SVT with induction of anesthesia   RIGHT HEART CATH N/A 11/19/2020   Procedure: RIGHT HEART CATH;  Surgeon: Jolaine Artist, MD;  Location: [facility];  Service: Cardiovascular;  Laterality: N/A;   right knee arthroscopy     SKIN SPLIT GRAFT Right 03/04/2018   Procedure: POSSIBLE SPLIT THICKNESS SKIN GRAFT;  Surgeon: [name], MD;  Location: [facility];  Service: Orthopedics;  Laterality: Right;   SKIN SPLIT GRAFT Right 04/01/2018   Procedure: REPEAT IRRIGATION AND DEBRIDEMENT RIGHT LEG, APPLY SPLIT THICKNESS SKIN GRAFT;  Surgeon: [name], MD;  Location: [facility];  Service: Orthopedics;  Laterality: Right;   TONSILLECTOMY AND ADENOIDECTOMY  Social History   Occupational History   Occupation: Disabled    Employer: RETIRED  Tobacco Use   Smoking status: Never    Smokeless tobacco: Never  Vaping Use   Vaping Use: Never used  Substance and Sexual Activity   Alcohol use: No    Alcohol/week: 0.0 standard drinks   Drug use: No   Sexual activity: Not Currently

## 2021-03-19 NOTE — Telephone Encounter (Signed)
-----   Message from Larina Bras, [place] sent at 02/25/2021  5:07 PM EST ----- Dont forget to call [facility] to get pt a bed on 03/19/21 for her 03/20/21 colonoscopy. I currently have bed placement request under your name until we find out differently.  Vivien Rota- Just FYI.

## 2021-03-19 NOTE — H&P (Signed)
Admission Note  Primary Care Physician:  [name], MD Primary Gastroenterologist:   Billie Lade MD  HPI: April [name] is a 71 y.o. female, with numerous serious comorbidities including congestive heart failure, last EF 70%, mild pulmonary hypertension, mild to moderate coronary artery disease, morbid obesity with BMI of 65, adult onset diabetes mellitus, hypertension, hyperlipidemia, and sleep apnea.  Also with chronic kidney disease stage IIIb, history of bilateral lower extremity DVTs, on chronic Eliquis She is being admitted currently to undergo bowel prep for planned colonoscopy and EGD tomorrow.  She did not feel that she would be able to prep at home with limited mobility. Patient has been off Eliquis over the past 2 days, has held [place]  Patient has strong family history of colon cancer, and both of her parents and is under going colonoscopy for that reason. She does also have history of GERD, on famotidine twice daily, intermittent mild nausea.  No complaints of dysphagia or odynophagia currently. Last colonoscopy was done in May 2017, normal with exception of small hemorrhoids.  Patient is on chronic nasal O2 at 2 to 3 L, uses BiPAP at night.  She has mild chronic shortness of breath which she says is worse with any ambulation.  She did have recent visit with Dr. Missy Sabins /cardiology and was felt to be stable from a cardiac standpoint.     Past Medical History:  Diagnosis Date   Anemia    Asthma    CAD (coronary artery disease)    Carpal tunnel syndrome    Cellulitis of both lower extremities 04/11/2015   CHF (congestive heart failure) (HCC)    Chronic kidney disease    [facility]- followed by [facility]   Colon polyp, hyperplastic 2263 & 3354   Complication of anesthesia 1999   svt with renal calculi surgery, no problems since   CTS (carpal tunnel syndrome)    bilateral   Diabetes mellitus    Eczema    GERD (gastroesophageal reflux disease)    History of  kidney stones 1999   Hyperlipidemia    Hypertension    Leg ulcer ([place]) 04/24/2015   Right lateral leg No evidence of an infection Monitor closely Keep edema controlled    Leg ulcer ([place])    right lower   Meralgia paresthetica    Dr. [name]   Morbid obesity ([place])    Neuropathy    toes and legs   Osteoarthrosis, unspecified whether generalized or localized, lower leg    knee   PUD (peptic ulcer disease)    Shortness of breath dyspnea    with exertion   Sleep apnea    per progress note 02/25/2018   Type II or unspecified type diabetes mellitus without mention of complication, not stated as uncontrolled    Unspecified hereditary and idiopathic peripheral neuropathy    Urticaria    Vitamin B12 deficiency    Wears glasses    Wound cellulitis    right upper leg, healing well    Past Surgical History:  Procedure Laterality Date   ABDOMINAL HYSTERECTOMY     BREAST BIOPSY Right 07/08/2018   CARDIAC CATHETERIZATION  2002   non obstructive disease   colonoscopy with polypectomy  2007 & 2012    hyperplastic ;Dr Watt Climes   COLONOSCOPY WITH PROPOFOL N/A 06/04/2015   Procedure: COLONOSCOPY WITH PROPOFOL;  Surgeon: Jerene Bears, MD;  Location: [facility];  Service: Gastroenterology;  Laterality: N/A;   DEBRIDEMENT LEG Right 03/02/2018  WOUND VAC APPLIED   DEBRIDEMENT LEG Right 05/27/2018   RIGHT LOWER LEG DEBRIDEMENT,  SKIN GRAFT, VAC PLACEMENT    DILATION AND CURETTAGE OF UTERUS     multiple   HEMORRHOID SURGERY     HEMORROIDECTOMY     I & D EXTREMITY Right 03/02/2018   Procedure: RIGHT LEG DEBRIDEMENT AND PLACE VAC;  Surgeon: [name], MD;  Location: [facility];  Service: Orthopedics;  Laterality: Right;   I & D EXTREMITY Right 03/04/2018   Procedure: REPEAT IRRIGATION AND DEBRIDEMENT RIGHT LEG, PLACE WOUND VAC;  Surgeon: [name], MD;  Location: [place];  Service: Orthopedics;  Laterality: Right;   I & D EXTREMITY Right 03/30/2018   Procedure: IRRIGATION AND DEBRIDEMENT RIGHT  LEG, APPLY WOUND VAC;  Surgeon: [name], MD;  Location: [facility];  Service: Orthopedics;  Laterality: Right;   I & D EXTREMITY Right 05/27/2018   Procedure: RIGHT LOWER LEG DEBRIDEMENT,  SKIN GRAFT, VAC PLACEMENT;  Surgeon: [name], MD;  Location: [facility];  Service: Orthopedics;  Laterality: Right;  RIGHT LOWER LEG DEBRIDEMENT,  SKIN GRAFT, VAC PLACEMENT   renal calculi  12/1997   SVT with induction of anesthesia   RIGHT HEART CATH N/A 11/19/2020   Procedure: RIGHT HEART CATH;  Surgeon: Jolaine Artist, MD;  Location: [facility];  Service: Cardiovascular;  Laterality: N/A;   right knee arthroscopy     SKIN SPLIT GRAFT Right 03/04/2018   Procedure: POSSIBLE SPLIT THICKNESS SKIN GRAFT;  Surgeon: [name], MD;  Location: [facility];  Service: Orthopedics;  Laterality: Right;   SKIN SPLIT GRAFT Right 04/01/2018   Procedure: REPEAT IRRIGATION AND DEBRIDEMENT RIGHT LEG, APPLY SPLIT THICKNESS SKIN GRAFT;  Surgeon: [name], MD;  Location: [facility];  Service: Orthopedics;  Laterality: Right;   TONSILLECTOMY AND ADENOIDECTOMY      Prior to Admission medications   Medication Sig Start Date End Date Taking? Authorizing Provider  acetaminophen (TYLENOL) 650 MG CR tablet Take 1,300 mg by mouth every 8 (eight) hours as needed for pain.   Yes [provider]  allopurinol (ZYLOPRIM) 100 MG tablet Take 2 tablets (200 mg total) by mouth daily. Patient taking differently: Take 200 mg by mouth daily as needed (gout). 06/27/20  Yes Burns, Claudina Lick, MD  apixaban (ELIQUIS) 5 MG TABS tablet Take 1 tablet (5 mg total) by mouth 2 (two) times daily. 12/13/20  Yes Burns, Claudina Lick, MD  atorvastatin (LIPITOR) 40 MG tablet Take 1 tablet (40 mg total) by mouth at bedtime. 12/13/20  Yes Burns, Claudina Lick, MD  calcium carbonate (TUMS - DOSED IN MG ELEMENTAL CALCIUM) 500 MG chewable tablet Chew 2 tablets by mouth daily as needed for indigestion or heartburn.   Yes [provider]   cholecalciferol (VITAMIN D3) 25 MCG (1000 UNIT) tablet Take 1 tablet (1,000 Units total) by mouth daily. 09/16/20  Yes Burns, Claudina Lick, MD  cyanocobalamin (,VITAMIN B-12,) 1000 MCG/ML injection INJECT 1ML INTO MUSCLE EVERY 30 DAYS 07/31/20  Yes Burns, Claudina Lick, MD  dapagliflozin propanediol (FARXIGA) 10 MG TABS tablet Take 10 mg by mouth daily.   Yes [provider]  diclofenac Sodium (VOLTAREN) 1 % GEL Apply 1 application topically as needed (pain).   Yes [provider]  diphenhydrAMINE (BENADRYL) 12.5 MG/5ML liquid Take 25 mg by mouth 4 (four) times daily as needed for allergies.   Yes [provider]  eszopiclone (LUNESTA) 1 MG TABS tablet Take  1 mg by mouth at bedtime as needed for sleep. Take immediately before bedtime   Yes [provider]  famotidine (PEPCID) 20 MG tablet Take 1 tablet (20 mg total) by mouth 2 (two) times daily. 11/11/20  Yes Burns, Claudina Lick, MD  FEROSUL 325 (65 Fe) MG tablet TAKE 1 TABLET BY MOUTH EVERY MONDAY, WEDNESDAY, AND FRIDAY 10/17/20  Yes Burns, Claudina Lick, MD  gabapentin (NEURONTIN) 100 MG capsule TAKE 1 CAPSULE(100 MG) BY MOUTH THREE TIMES DAILY 12/20/20  Yes Burns, Claudina Lick, MD  guaiFENesin-dextromethorphan (ROBITUSSIN DM) 100-10 MG/5ML syrup Take 5 mLs by mouth every 4 (four) hours as needed for cough.   Yes [provider]  hydrOXYzine (ATARAX/VISTARIL) 25 MG tablet Take 1 tablet (25 mg total) by mouth 3 (three) times daily as needed for itching. 03/01/19  Yes Fair, Marin Shutter, MD  montelukast (SINGULAIR) 10 MG tablet TAKE 1 TABLET(10 MG) BY MOUTH AT BEDTIME 12/13/20  Yes Burns, Claudina Lick, MD  nebivolol (BYSTOLIC) 5 MG tablet Take 1 tablet (5 mg total) by mouth daily. 11/23/20  Yes Regalado, Belkys A, MD  Oxcarbazepine (TRILEPTAL) 300 MG tablet Take 1 tablet (300 mg total) by mouth 2 (two) times daily. 01/16/21  Yes Burns, Claudina Lick, MD  oxyCODONE (OXY IR/ROXICODONE) 5 MG immediate release tablet Take 5 mg by mouth every 6 (six)  hours as needed for severe pain.   Yes [provider]  polyethylene glycol (MIRALAX / GLYCOLAX) 17 g packet Take 17 g by mouth daily as needed for moderate constipation.   Yes [provider]  potassium chloride SA (KLOR-CON M) 20 MEQ tablet Take 1 tablet (20 mEq total) by mouth daily. 02/18/21  Yes Burns, Claudina Lick, MD  spironolactone (ALDACTONE) 25 MG tablet TAKE 1 TABLET(25 MG) BY MOUTH DAILY 02/02/21  Yes Burns, Claudina Lick, MD  tirzepatide [facility]) 2.5 MG/0.5ML Pen Inject 2.5 mg into the skin once a week. 03/06/21  Yes Burns, Claudina Lick, MD  torsemide (DEMADEX) 20 MG tablet Take 40 mg by mouth 2 (two) times daily.   Yes [provider]  VENTOLIN HFA 108 (90 Base) MCG/ACT inhaler Inhale 2 puffs into the lungs every 4 (four) hours as needed for wheezing. 11/13/19  Yes [provider]  Alcohol Swabs (DROPSAFE ALCOHOL PREP) 70 % PADS USE TOPICALLY AS DIRECTED 12/30/20   [name], MD  Blood Glucose Calibration (TRUE METRIX LEVEL 1) Low SOLN UAD  E11.9 10/14/20   [name], MD  Blood Glucose Monitoring Suppl (TRUE METRIX METER) DEVI True Metrix Level 1 solution    [provider]  budesonide-formoterol (SYMBICORT) 160-4.5 MCG/ACT inhaler one - two inhalations every 12 hours; gargle and spit after use Patient not taking: Reported on 03/13/2021 02/19/15   [name], MD  Torsemide 40 MG TABS Take 80 mg by mouth 2 (two) times daily. Patient not taking: Reported on 03/13/2021 01/10/21   Rafael Bihari, FNP    Current Facility-Administered Medications  Medication Dose Route Frequency Provider Last Rate Last Admin   acetaminophen (TYLENOL) tablet 650 mg  650 mg Oral Q4H PRN Allena Pietila S, PA-C       albuterol (VENTOLIN HFA) 108 (90 Base) MCG/ACT inhaler 2 puff  2 puff Inhalation Q4H PRN Havanah Nelms S, PA-C       allopurinol (ZYLOPRIM) tablet 100 mg  100 mg Oral BID Raney Koeppen S, PA-C       atorvastatin (LIPITOR) tablet 40 mg  40 mg Oral Daily  ,  S, PA-C      ° bisacodyl (DULCOLAX) EC tablet 20 mg  20 mg Oral Once ,  S, PA-C      ° budesonide (PULMICORT) nebulizer solution 0.5 mg  0.5 mg Nebulization BID PRN ,  S, PA-C      ° famotidine (PEPCID) tablet 20 mg  20 mg Oral BID ,  S, PA-C      ° gabapentin (NEURONTIN) capsule 100 mg  100 mg Oral TID ,  S, PA-C      ° mometasone-formoterol (DULERA) 200-5 MCG/ACT inhaler 2 puff  2 puff Inhalation BID ,  S, PA-C      ° montelukast (SINGULAIR) tablet 10 mg  10 mg Oral QHS ,  S, PA-C      ° nebivolol (BYSTOLIC) tablet 5 mg  5 mg Oral Daily ,  S, PA-C      ° OXcarbazepine (TRILEPTAL) 300 MG/5ML suspension 300 mg  300 mg Oral BID ,  S, PA-C      ° peg 3350 powder (MOVIPREP) kit 100 g  0.5 kit Oral Once Pyrtle, Jay M, MD      ° And  ° peg 3350 powder (MOVIPREP) kit 100 g  0.5 kit Oral Once Pyrtle, Jay M, MD      ° potassium chloride (KLOR-CON) packet 20 mEq  20 mEq Oral Daily ,  S, PA-C      ° spironolactone (ALDACTONE) tablet 25 mg  25 mg Oral Daily ,  S, PA-C      ° torsemide (DEMADEX) tablet 80 mg  80 mg Oral BID ,  S, PA-C      ° ° °Allergies as of 12/20/2020 - Review Complete 12/20/2020  °Allergen Reaction Noted  ° Penicillins Rash and Other (See Comments) 01/08/2010  ° Shellfish allergy Anaphylaxis 03/31/2018  ° Sulfonamide derivatives Anaphylaxis and Other (See Comments) 01/08/2010  ° Hydrochlorothiazide w-triamterene Other (See Comments) 01/08/2010  ° Lotensin [benazepril hcl] Hives 05/25/2017  ° Dipyridamole Other (See Comments) 05/25/2017  ° Estrogens Other (See Comments) 05/25/2017  ° Hydrochlorothiazide Other (See Comments) 05/25/2017  ° Latex Other (See Comments) 05/08/2020  ° Metronidazole Other (See Comments)   ° Other Itching 08/12/2018  ° Spironolactone Other (See Comments)   ° Sulfa antibiotics Other (See Comments) 05/25/2017  ° Torsemide Other (See  Comments)   ° Valsartan Other (See Comments)   ° Mustard [allyl isothiocyanate] Itching 03/31/2018  ° ° °Family History  °Problem Relation Age of Onset  ° Colon cancer Mother   ° Prostate cancer Father   ° Colon cancer Father   ° Diabetes Maternal Aunt   ° Breast cancer Maternal Aunt   ° Diabetes Maternal Uncle   ° Diabetes Paternal Aunt   ° Stroke Paternal Aunt   °     > 65  ° Heart disease Paternal Aunt   ° Diabetes Paternal Uncle   ° Breast cancer Maternal Aunt   °      X 2  ° Breast cancer Cousin   ° ° °Social History  ° °Socioeconomic History  ° Marital status: Married  °  Spouse name: Dwight  ° Number of children: 1  ° Years of education: BS  ° Highest education level: Not on file  °Occupational History  ° Occupation: Disabled  °  Employer: RETIRED  °Tobacco Use  ° Smoking status: Never  ° Smokeless tobacco: Never  °Vaping Use  ° Vaping Use: Never used  °Substance and Sexual Activity  ° Alcohol   use: No    Alcohol/week: 0.0 standard drinks   Drug use: No   Sexual activity: Not Currently  Other Topics Concern   Not on file  Social History Narrative   Patient lives at home with her husband [name] Patient is retired and has a Conservation officer, nature.    Caffeine - some times.   Right handed.   Social Determinants of Health   Financial Resource Strain: Not on file  Food Insecurity: Not on file  Transportation Needs: Not on file  Physical Activity: Not on file  Stress: Not on file  Social Connections: Not on file  Intimate Partner Violence: Not on file    Review of Systems:  All systems reviewed an negative except where noted in HPI.   Physical Exam: Vital signs in last 24 hours: Temp:  [99.3 F (37.4 C)] 99.3 F (37.4 C) (02/15 1643) Pulse Rate:  [80] 80 (02/15 1643) Resp:  [18] 18 (02/15 1643) BP: (155)/(87) 155/87 (02/15 1643) SpO2:  [100 %] 100 % (02/15 1643) Last BM Date : 03/19/21 General:  Pleasant, well-developed, older African-American female, morbidly obese, on nasal  O2 ,female in NAD Head:  Normocephalic and atraumatic. Eyes:  Sclera clear, no icterus.   Conjunctiva pink. Ears:  Normal auditory acuity. Mouth:  No deformity or lesions.  Neck:  Supple; no masses . Lungs: Decreased breath sounds bilaterally, no acute distress. Heart:  Regular rate and rhythm; no murmurs. Abdomen:  Soft, morbidly obese nontender. . No obvious masses.  Normal bowel sounds Rectal: Not done Msk:  Symmetrical without gross deformities..  Extremities: Chronic stasis changes bilaterally significant, wears compression stockings at home Neurologic:  Alert and  oriented x4;  grossly normal neurologically. Skin:  Intact without significant lesions or rashes. Cervical Nodes:  No significant cervical adenopathy. Psych:  Alert and cooperative. Normal mood and affect.  Lab Results: No results for input(s): WBC, HGB, HCT, PLT in the last 72 hours. BMET No results for input(s): NA, K, CL, CO2, GLUCOSE, BUN, CREATININE, CALCIUM in the last 72 hours. LFT No results for input(s): PROT, ALBUMIN, AST, ALT, ALKPHOS, BILITOT, BILIDIR, IBILI in the last 72 hours. PT/INR No results for input(s): LABPROT, INR in the last 72 hours. Hepatitis Panel No results for input(s): HEPBSAG, HCVAB, HEPAIGM, HEPBIGM in the last 72 hours.  Studies/Results: No results found.  Impression / Plan:   #89 71 year old African-American female, morbidly obese with BMI greater than 65, multiple comorbidities, who is admitted on observation to undergo bowel prep for planned endoscopic evaluation with colonoscopy and EGD. Patient unable to prep at home  Significant family history of colon cancer  #2 chronic GERD-intermittent breakthrough, no dysphagia  #3 chronic anticoagulation secondary to history of bilateral lower extremity DVTs-on Eliquis chronically HOLD OVER THE PAST 2 DAYS  #4 chronic hypoxic respiratory failure on chronic O2 at home 2 L, uses BiPAP at nighttime #5 sleep apnea #6 mild pulmonary  hypertension #7 congestive heart failure #8 chronic kidney disease stage III #9.onset diabetes mellitus #10 hypertension 11.  Nonobstructive coronary artery disease  Plan; clear liquid diet until midnight, then n.p.o. Movi  prep to be completed this evening Dulcolax 4 tablets at start of prep Continue home medication regimen BiPAP for this evening, if able otherwise continue nasal O2 at 2 L  Patient scheduled for colonoscopy and EGD with Dr. Hilarie Fredrickson tomorrow morning.  Both procedures have been discussed in detail with patient including indications risk and benefits, Dr. Hilarie Fredrickson had discussed in detail with  the patient at last office visit. °Further plans pending results at endoscopic evaluation, °Plan is for discharge home tomorrow post procedures. ° ° LOS: 1 day  ° °  PA-C 03/19/2021, 5:25 PM ° °  °

## 2021-03-19 NOTE — Telephone Encounter (Signed)
Inbound call from patient stated that she has a procedure tomorrow at [facility] and has not got a call from them. Seeking advice, please advise.

## 2021-03-19 NOTE — Telephone Encounter (Signed)
Called and scheduled patient an appt for a qualifying walk to be able to receive a POC machine. Nothing further needed.

## 2021-03-19 NOTE — Telephone Encounter (Signed)
Call placed to bed request RN and she states there are no open bed and there are about 25 people holding in the ER for beds. State they will call her later today when bed becomes available. Pt aware.

## 2021-03-19 NOTE — Anesthesia Preprocedure Evaluation (Addendum)
Anesthesia Evaluation  Patient identified by MRN, date of birth, ID band Patient awake    Reviewed: Allergy & Precautions, NPO status , Patient's Chart, lab work & pertinent test results, reviewed documented beta blocker date and time   Airway Mallampati: II  TM Distance: >3 FB Neck ROM: Full    Dental  (+) Teeth Intact, Dental Advisory Given   Pulmonary asthma , sleep apnea ,    Pulmonary exam normal breath sounds clear to auscultation       Cardiovascular hypertension, Pt. on home beta blockers and Pt. on medications + CAD and +CHF  Normal cardiovascular exam+ dysrhythmias Supra Ventricular Tachycardia  Rhythm:Regular Rate:Normal     Neuro/Psych  Neuromuscular disease    GI/Hepatic Neg liver ROS, PUD, GERD  Medicated,  Endo/Other  diabetes, Type 2, Oral Hypoglycemic AgentsMorbid obesity  Renal/GU Renal InsufficiencyRenal disease     Musculoskeletal negative musculoskeletal ROS (+)   Abdominal   Peds  Hematology  (+) Blood dyscrasia (Eliquis), ,   Anesthesia Other Findings   Reproductive/Obstetrics                            Anesthesia Physical Anesthesia Plan  ASA: 4  Anesthesia Plan: MAC   Post-op Pain Management:    Induction: Intravenous  PONV Risk Score and Plan: 2 and TIVA and Treatment may vary due to age or medical condition  Airway Management Planned: Natural Airway and Nasal Cannula  Additional Equipment:   Intra-op Plan:   Post-operative Plan:   Informed Consent: I have reviewed the patients History and Physical, chart, labs and discussed the procedure including the risks, benefits and alternatives for the proposed anesthesia with the patient or authorized representative who has indicated his/her understanding and acceptance.     Dental advisory given  Plan Discussed with: CRNA  Anesthesia Plan Comments:        Anesthesia Quick Evaluation

## 2021-03-20 ENCOUNTER — Observation Stay (HOSPITAL_BASED_OUTPATIENT_CLINIC_OR_DEPARTMENT_OTHER): Payer: Medicare PPO | Admitting: Anesthesiology

## 2021-03-20 ENCOUNTER — Encounter (HOSPITAL_COMMUNITY)
Admission: AD | Disposition: A | Discharge status: Home / Self Care | Payer: Self-pay | Source: Ambulatory Visit | Attending: Internal Medicine

## 2021-03-20 ENCOUNTER — Observation Stay (HOSPITAL_COMMUNITY): Payer: Medicare PPO | Admitting: Anesthesiology

## 2021-03-20 ENCOUNTER — Ambulatory Visit: Payer: Medicare PPO | Admitting: Orthopedic Surgery

## 2021-03-20 ENCOUNTER — Encounter (HOSPITAL_COMMUNITY): Payer: Self-pay | Admitting: Internal Medicine

## 2021-03-20 DIAGNOSIS — K449 Diaphragmatic hernia without obstruction or gangrene: Secondary | ICD-10-CM

## 2021-03-20 DIAGNOSIS — I2510 Atherosclerotic heart disease of native coronary artery without angina pectoris: Secondary | ICD-10-CM | POA: Diagnosis not present

## 2021-03-20 DIAGNOSIS — I509 Heart failure, unspecified: Secondary | ICD-10-CM | POA: Diagnosis not present

## 2021-03-20 DIAGNOSIS — Z20822 Contact with and (suspected) exposure to covid-19: Secondary | ICD-10-CM | POA: Diagnosis not present

## 2021-03-20 DIAGNOSIS — K635 Polyp of colon: Secondary | ICD-10-CM | POA: Diagnosis not present

## 2021-03-20 DIAGNOSIS — K319 Disease of stomach and duodenum, unspecified: Secondary | ICD-10-CM | POA: Diagnosis not present

## 2021-03-20 DIAGNOSIS — K222 Esophageal obstruction: Secondary | ICD-10-CM

## 2021-03-20 DIAGNOSIS — D122 Benign neoplasm of ascending colon: Secondary | ICD-10-CM | POA: Diagnosis not present

## 2021-03-20 DIAGNOSIS — I110 Hypertensive heart disease with heart failure: Secondary | ICD-10-CM | POA: Diagnosis not present

## 2021-03-20 DIAGNOSIS — K648 Other hemorrhoids: Secondary | ICD-10-CM | POA: Diagnosis not present

## 2021-03-20 DIAGNOSIS — Z1211 Encounter for screening for malignant neoplasm of colon: Secondary | ICD-10-CM | POA: Diagnosis not present

## 2021-03-20 DIAGNOSIS — K2970 Gastritis, unspecified, without bleeding: Secondary | ICD-10-CM

## 2021-03-20 DIAGNOSIS — J45909 Unspecified asthma, uncomplicated: Secondary | ICD-10-CM | POA: Diagnosis not present

## 2021-03-20 DIAGNOSIS — K529 Noninfective gastroenteritis and colitis, unspecified: Secondary | ICD-10-CM

## 2021-03-20 DIAGNOSIS — I130 Hypertensive heart and chronic kidney disease with heart failure and stage 1 through stage 4 chronic kidney disease, or unspecified chronic kidney disease: Secondary | ICD-10-CM | POA: Diagnosis not present

## 2021-03-20 DIAGNOSIS — Z800 Family history of malignant neoplasm of digestive organs: Secondary | ICD-10-CM

## 2021-03-20 DIAGNOSIS — K219 Gastro-esophageal reflux disease without esophagitis: Secondary | ICD-10-CM

## 2021-03-20 DIAGNOSIS — K5140 Inflammatory polyps of colon without complications: Secondary | ICD-10-CM | POA: Diagnosis not present

## 2021-03-20 DIAGNOSIS — N1832 Chronic kidney disease, stage 3b: Secondary | ICD-10-CM | POA: Diagnosis not present

## 2021-03-20 HISTORY — PX: COLONOSCOPY WITH PROPOFOL: SHX5780

## 2021-03-20 HISTORY — PX: BIOPSY: SHX5522

## 2021-03-20 HISTORY — PX: POLYPECTOMY: SHX5525

## 2021-03-20 HISTORY — PX: ESOPHAGOGASTRODUODENOSCOPY (EGD) WITH PROPOFOL: SHX5813

## 2021-03-20 LAB — GLUCOSE, CAPILLARY: Glucose-Capillary: 82 mg/dL (ref 70–99)

## 2021-03-20 SURGERY — ESOPHAGOGASTRODUODENOSCOPY (EGD) WITH PROPOFOL
Anesthesia: Monitor Anesthesia Care

## 2021-03-20 MED ORDER — PROPOFOL 1000 MG/100ML IV EMUL
INTRAVENOUS | Status: AC
  Filled 2021-03-20: qty 100

## 2021-03-20 MED ORDER — PHENYLEPHRINE 40 MCG/ML (10ML) SYRINGE FOR IV PUSH (FOR BLOOD PRESSURE SUPPORT)
PREFILLED_SYRINGE | INTRAVENOUS | Status: DC | PRN
  Administered 2021-03-20 (×2): 100 ug via INTRAVENOUS
  Administered 2021-03-20: 120 ug via INTRAVENOUS
  Administered 2021-03-20: 80 ug via INTRAVENOUS

## 2021-03-20 MED ORDER — PROPOFOL 10 MG/ML IV BOLUS
INTRAVENOUS | Status: DC | PRN
  Administered 2021-03-20 (×5): 10 mg via INTRAVENOUS

## 2021-03-20 MED ORDER — LIDOCAINE HCL (CARDIAC) PF 100 MG/5ML IV SOSY
PREFILLED_SYRINGE | INTRAVENOUS | Status: DC | PRN
  Administered 2021-03-20: 100 mg via INTRAVENOUS

## 2021-03-20 MED ORDER — PROPOFOL 500 MG/50ML IV EMUL
INTRAVENOUS | Status: DC | PRN
  Administered 2021-03-20: 75 ug/kg/min via INTRAVENOUS

## 2021-03-20 MED ORDER — SODIUM CHLORIDE 0.9 % IV SOLN: INTRAVENOUS | Status: DC

## 2021-03-20 MED ORDER — PROPOFOL 10 MG/ML IV BOLUS
INTRAVENOUS | Status: AC
  Filled 2021-03-20: qty 20

## 2021-03-20 SURGICAL SUPPLY — 25 items
BLOCK BITE 60FR ADLT L/F BLUE (MISCELLANEOUS) ×4 IMPLANT
ELECT REM PT RETURN 9FT ADLT (ELECTROSURGICAL)
ELECTRODE REM PT RTRN 9FT ADLT (ELECTROSURGICAL) IMPLANT
FCP BXJMBJMB 240X2.8X (CUTTING FORCEPS)
FLOOR PAD 36X40 (MISCELLANEOUS) ×3
FORCEP RJ3 GP 1.8X160 W-NEEDLE (CUTTING FORCEPS) IMPLANT
FORCEPS BIOP RAD 4 LRG CAP 4 (CUTTING FORCEPS) IMPLANT
FORCEPS BIOP RJ4 240 W/NDL (CUTTING FORCEPS)
FORCEPS BXJMBJMB 240X2.8X (CUTTING FORCEPS) IMPLANT
INJECTOR/SNARE I SNARE (MISCELLANEOUS) IMPLANT
LUBRICANT JELLY 4.5OZ STERILE (MISCELLANEOUS) IMPLANT
MANIFOLD NEPTUNE II (INSTRUMENTS) IMPLANT
NDL SCLEROTHERAPY 25GX240 (NEEDLE) IMPLANT
NEEDLE SCLEROTHERAPY 25GX240 (NEEDLE) IMPLANT
PAD FLOOR 36X40 (MISCELLANEOUS) ×3 IMPLANT
PROBE APC STR FIRE (PROBE) IMPLANT
PROBE INJECTION GOLD (MISCELLANEOUS)
PROBE INJECTION GOLD 7FR (MISCELLANEOUS) IMPLANT
SNARE ROTATE MED OVAL 20MM (MISCELLANEOUS) IMPLANT
SNARE SHORT THROW 13M SML OVAL (MISCELLANEOUS) IMPLANT
SYR 50ML LL SCALE MARK (SYRINGE) IMPLANT
TRAP SPECIMEN MUCOUS 40CC (MISCELLANEOUS) IMPLANT
TUBING ENDO SMARTCAP PENTAX (MISCELLANEOUS) ×8 IMPLANT
TUBING IRRIGATION ENDOGATOR (MISCELLANEOUS) ×4 IMPLANT
WATER STERILE IRR 1000ML POUR (IV SOLUTION) IMPLANT

## 2021-03-20 NOTE — Transfer of Care (Signed)
Immediate Anesthesia Transfer of Care Note  Patient: April [name]  Procedure(s) Performed: ESOPHAGOGASTRODUODENOSCOPY (EGD) WITH PROPOFOL COLONOSCOPY WITH PROPOFOL BIOPSY POLYPECTOMY  Patient Location: Endoscopy Unit  Anesthesia Type:MAC  Level of Consciousness: awake, alert , oriented, drowsy and patient cooperative  Airway & Oxygen Therapy: Patient Spontanous Breathing and Patient connected to face mask oxygen  Post-op Assessment: Report given to RN and Post -op Vital signs reviewed and stable  Post vital signs: Reviewed and stable  Last Vitals:  Vitals Value Taken Time  BP 95/58   Temp    Pulse 81 03/20/21 0815  Resp 18 03/20/21 0815  SpO2 98 % 03/20/21 0815  Vitals shown include unvalidated device data.  Last Pain:  Vitals:   03/20/21 0707  TempSrc: Temporal  PainSc: 0-No pain         Complications: No notable events documented.

## 2021-03-20 NOTE — Discharge Instructions (Signed)
YOU HAD AN ENDOSCOPIC PROCEDURE TODAY: Refer to the procedure report and other information in the discharge instructions given to you for any specific questions about what was found during the examination. If this information does not answer your questions, please call [place] office at [phone] to clarify.  ° °YOU SHOULD EXPECT: Some feelings of bloating in the abdomen. Passage of more gas than usual. Walking can help get rid of the air that was put into your GI tract during the procedure and reduce the bloating. If you had a lower endoscopy (such as a colonoscopy or flexible sigmoidoscopy) you may notice spotting of blood in your stool or on the toilet paper. Some abdominal soreness may be present for a day or two, also. ° °DIET: Your first meal following the procedure should be a light meal and then it is ok to progress to your normal diet. A half-sandwich or bowl of soup is an example of a good first meal. Heavy or fried foods are harder to digest and may make you feel nauseous or bloated. Drink plenty of fluids but you should avoid alcoholic beverages for 24 hours. If you had a esophageal dilation, please see attached instructions for diet.   ° °ACTIVITY: Your care partner should take you home directly after the procedure. You should plan to take it easy, moving slowly for the rest of the day. You can resume normal activity the day after the procedure however YOU SHOULD NOT DRIVE, use power tools, machinery or perform tasks that involve climbing or major physical exertion for 24 hours (because of the sedation medicines used during the test).  ° °SYMPTOMS TO REPORT IMMEDIATELY: °A gastroenterologist can be reached at any hour. Please call [phone]  for any of the following symptoms:  °Following lower endoscopy (colonoscopy, flexible sigmoidoscopy) °Excessive amounts of blood in the stool  °Significant tenderness, worsening of abdominal pains  °Swelling of the abdomen that is new, acute  °Fever of 100° or  higher  °Following upper endoscopy (EGD, EUS, ERCP, esophageal dilation) °Vomiting of blood or coffee ground material  °New, significant abdominal pain  °New, significant chest pain or pain under the shoulder blades  °Painful or persistently difficult swallowing  °New shortness of breath  °Black, tarry-looking or red, bloody stools ° °FOLLOW UP:  °If any biopsies were taken you will be contacted by phone or by letter within the next 1-3 weeks. Call [phone]  if you have not heard about the biopsies in 3 weeks.  °Please also call with any specific questions about appointments or follow up tests. ° °

## 2021-03-20 NOTE — Clinical Social Work Note (Signed)
°  Transition of Care [facility]) Screening Note   Patient Details  Name: April [name] Date of Birth: [birth year]   Transition of Care [facility]) CM/SW Contact:    Trish Mage, LCSW Phone Number: 03/20/2021, 9:03 AM    Transition of Care Department [facility]) has reviewed patient and no TOC needs have been identified at this time. We will continue to monitor patient advancement through interdisciplinary progression rounds. If new patient transition needs arise, please place a TOC consult.

## 2021-03-20 NOTE — Discharge Summary (Signed)
[facility] Discharge Summary  Name: April [name] MRN: [id number] DOB: Nov 17, 1950 71 y.o. PCP:  [name], MD  Date of Admission: 03/19/2021  4:07 PM Date of Discharge: 03/20/2021 Attending Physician: Jerene Bears, MD  Discharge Diagnosis: Principal Problem:   Encounter for screening colonoscopy Active Problems:   Benign neoplasm of ascending colon   Gastritis without bleeding   Consultations: None  GI Procedures: EGD and colonoscopy 03/21/2011 with Dr. Hilarie Fredrickson  History/Physical Exam:  See Admission H&P  Admission HPI: 71 year old female who was admitted on 03/19/2021 on order to undergo bowel preparation for her planned EGD and colonoscopy.  BMI greater than 50 required hospital procedures and she was unable to prep at home.  She underwent EGD and colonoscopy on 03/20/2021 without any complications.  Diet was ordered and patient instructed to resume her medications as she had been taking them at home including her Eliquis.  Patient was stable at the time of discharge.  Discharge Vitals:  BP (!) 103/59    Pulse 72    Temp 97.6 F (36.4 C) (Tympanic)    Resp 18    SpO2 97%   Discharge Labs:  Results for orders placed or performed during the hospital encounter of 03/19/21 (from the past 24 hour(s))  CBC with Differential     Status: Abnormal   Collection Time: 03/19/21  5:45 PM  Result Value Ref Range   WBC 5.1 4.0 - 10.5 K/uL   RBC 3.85 (L) 3.87 - 5.11 MIL/uL   Hemoglobin 12.0 12.0 - 15.0 g/dL   HCT 39.0 36.0 - 46.0 %   MCV 101.3 (H) 80.0 - 100.0 fL   MCH 31.2 26.0 - 34.0 pg   MCHC 30.8 30.0 - 36.0 g/dL   RDW 15.9 (H) 11.5 - 15.5 %   Platelets 173 150 - 400 K/uL   nRBC 1.0 (H) 0.0 - 0.2 %   Neutrophils Relative % 62 %   Neutro Abs 3.2 1.7 - 7.7 K/uL   Lymphocytes Relative 26 %   Lymphs Abs 1.3 0.7 - 4.0 K/uL   Monocytes Relative 8 %   Monocytes Absolute 0.4 0.1 - 1.0 K/uL   Eosinophils Relative 2 %   Eosinophils Absolute 0.1 0.0 - 0.5 K/uL    Basophils Relative 1 %   Basophils Absolute 0.1 0.0 - 0.1 K/uL   Immature Granulocytes 1 %   Abs Immature Granulocytes 0.03 0.00 - 0.07 K/uL  Comprehensive metabolic panel Once     Status: Abnormal   Collection Time: 03/19/21  5:45 PM  Result Value Ref Range   Sodium 135 135 - 145 mmol/L   Potassium 3.8 3.5 - 5.1 mmol/L   Chloride 93 (L) 98 - 111 mmol/L   CO2 34 (H) 22 - 32 mmol/L   Glucose, Bld 86 70 - 99 mg/dL   BUN 16 8 - 23 mg/dL   Creatinine, Ser 1.56 (H) 0.44 - 1.00 mg/dL   Calcium 8.9 8.9 - 10.3 mg/dL   Total Protein 7.2 6.5 - 8.1 g/dL   Albumin 3.9 3.5 - 5.0 g/dL   AST 18 15 - 41 U/L   ALT 17 0 - 44 U/L   Alkaline Phosphatase 89 38 - 126 U/L   Total Bilirubin 0.6 0.3 - 1.2 mg/dL   GFR, Estimated 36 (L) >60 mL/min   Anion gap 8 5 - 15  Resp Panel by RT-PCR (Flu A&B, Covid) Nasopharyngeal Swab     Status: None   Collection  Time: 03/19/21  6:12 PM   Specimen: Nasopharyngeal Swab; Nasopharyngeal(NP) swabs in vial transport medium  Result Value Ref Range   SARS Coronavirus 2 by RT PCR NEGATIVE NEGATIVE   Influenza A by PCR NEGATIVE NEGATIVE   Influenza B by PCR NEGATIVE NEGATIVE  Glucose, capillary     Status: None   Collection Time: 03/20/21  7:25 AM  Result Value Ref Range   Glucose-Capillary 82 70 - 99 mg/dL   *Note: Due to a large number of results and/or encounters for the requested time period, some results have not been displayed. A complete set of results can be found in Results Review.    Disposition and follow-up:   [name] was discharged from [facility] in stable condition.    Follow-up Appointments: Discharge Instructions     Call MD for:  persistant nausea and vomiting   Complete by: As directed    Call MD for:  severe uncontrolled pain   Complete by: As directed    Call MD for:  temperature >100.4   Complete by: As directed    Diet - low sodium heart healthy   Complete by: As directed    Diet general   Complete by: As  directed    Resume regular diet.   Discharge instructions   Complete by: As directed    Ok to resume Eliquis today.   Increase activity slowly   Complete by: As directed    Increase activity slowly   Complete by: As directed    Increase activity slowly   Complete by: As directed        Discharge Medications: Allergies as of 03/20/2021       Reactions   Penicillins Rash, Other (See Comments)   She was told not to take it anymore. Has patient had a PCN reaction causing immediate rash, facial/tongue/throat swelling, SOB or lightheadedness with hypotension: Yes Has patient had a PCN reaction causing severe rash involving mucus membranes or skin necrosis: No Has patient had a PCN reaction that required hospitalization: No Has patient had a PCN reaction occurring within the last 10 years: No If all of the above answers are "NO", then may proceed with Cephalosporin use.'   Shellfish Allergy Anaphylaxis   Sulfonamide Derivatives Anaphylaxis, Other (See Comments)   Hydrochlorothiazide W-triamterene Other (See Comments)   Hypokalemia   Lotensin [benazepril Hcl] Hives   Dipyridamole Other (See Comments)   Unknown reaction   Estrogens Other (See Comments)   Unknown reaction   Hydrochlorothiazide Other (See Comments)   Unknown reaction, Hypokalemia?   Latex Other (See Comments)   Unknown reaction - stated previously during surgery gloves had to be changed, but unsure if it is still an allergy or not.    Metronidazole Other (See Comments)   Unknown reaction   Other Itching   PICKLES   Spironolactone Other (See Comments)   UNSPECIFIED > "kidney problems"   Torsemide Other (See Comments)   Unknown reaction   Valsartan Other (See Comments)   Unknown reaction   Mustard [allyl Isothiocyanate] Itching        Medication List     TAKE these medications    acetaminophen 650 MG CR tablet Commonly known as: TYLENOL Take 1,300 mg by mouth every 8 (eight) hours as needed for pain.    allopurinol 100 MG tablet Commonly known as: ZYLOPRIM Take 2 tablets (200 mg total) by mouth daily. What changed:  when to take this reasons to take this  apixaban 5 MG Tabs tablet Commonly known as: ELIQUIS Take 1 tablet (5 mg total) by mouth 2 (two) times daily.   atorvastatin 40 MG tablet Commonly known as: LIPITOR Take 1 tablet (40 mg total) by mouth at bedtime.   budesonide-formoterol 160-4.5 MCG/ACT inhaler Commonly known as: Symbicort one - two inhalations every 12 hours; gargle and spit after use   calcium carbonate 500 MG chewable tablet Commonly known as: TUMS - dosed in mg elemental calcium Chew 2 tablets by mouth daily as needed for indigestion or heartburn.   cholecalciferol 25 MCG (1000 UNIT) tablet Commonly known as: VITAMIN D3 Take 1 tablet (1,000 Units total) by mouth daily.   cyanocobalamin 1000 MCG/ML injection Commonly known as: (VITAMIN B-12) INJECT 1ML INTO MUSCLE EVERY 30 DAYS   dapagliflozin propanediol 10 MG Tabs tablet Commonly known as: FARXIGA Take 10 mg by mouth daily.   diclofenac Sodium 1 % Gel Commonly known as: VOLTAREN Apply 1 application topically as needed (pain).   diphenhydrAMINE 12.5 MG/5ML liquid Commonly known as: BENADRYL Take 25 mg by mouth 4 (four) times daily as needed for allergies.   DropSafe Alcohol Prep 70 % Pads USE TOPICALLY AS DIRECTED   eszopiclone 1 MG Tabs tablet Commonly known as: LUNESTA Take 1 mg by mouth at bedtime as needed for sleep. Take immediately before bedtime   famotidine 20 MG tablet Commonly known as: Pepcid Take 1 tablet (20 mg total) by mouth 2 (two) times daily.   FeroSul 325 (65 FE) MG tablet Generic drug: ferrous sulfate TAKE 1 TABLET BY MOUTH EVERY MONDAY, WEDNESDAY, AND FRIDAY   gabapentin 100 MG capsule Commonly known as: NEURONTIN TAKE 1 CAPSULE(100 MG) BY MOUTH THREE TIMES DAILY   guaiFENesin-dextromethorphan 100-10 MG/5ML syrup Commonly known as: ROBITUSSIN DM Take 5 mLs  by mouth every 4 (four) hours as needed for cough.   hydrOXYzine 25 MG tablet Commonly known as: ATARAX Take 1 tablet (25 mg total) by mouth 3 (three) times daily as needed for itching.   montelukast 10 MG tablet Commonly known as: SINGULAIR TAKE 1 TABLET(10 MG) BY MOUTH AT BEDTIME   nebivolol 5 MG tablet Commonly known as: BYSTOLIC Take 1 tablet (5 mg total) by mouth daily.   Oxcarbazepine 300 MG tablet Commonly known as: TRILEPTAL Take 1 tablet (300 mg total) by mouth 2 (two) times daily.   oxyCODONE 5 MG immediate release tablet Commonly known as: Oxy IR/ROXICODONE Take 5 mg by mouth every 6 (six) hours as needed for severe pain.   polyethylene glycol 17 g packet Commonly known as: MIRALAX / GLYCOLAX Take 17 g by mouth daily as needed for moderate constipation.   potassium chloride SA 20 MEQ tablet Commonly known as: KLOR-CON M Take 1 tablet (20 mEq total) by mouth daily.   spironolactone 25 MG tablet Commonly known as: ALDACTONE TAKE 1 TABLET(25 MG) BY MOUTH DAILY   tirzepatide 2.5 MG/0.5ML Pen Commonly known as: MOUNJARO Inject 2.5 mg into the skin once a week.   torsemide 20 MG tablet Commonly known as: DEMADEX Take 40 mg by mouth 2 (two) times daily. What changed: Another medication with the same name was removed. Continue taking this medication, and follow the directions you see here.   True Metrix Level 1 Low Soln UAD  E11.9   True Metrix Meter Devi True Metrix Level 1 solution   Ventolin HFA 108 (90 Base) MCG/ACT inhaler Generic drug: albuterol Inhale 2 puffs into the lungs every 4 (four) hours as needed for wheezing.  Signed: Laban Emperor. Cerise Lieber 03/20/2021, 10:00 AM

## 2021-03-20 NOTE — Op Note (Signed)
[facility] Patient Name: April [name] Procedure Date: 03/20/2021 MRN: [id number] Attending MD: Jerene Bears , MD Date of Birth: 1950/11/29 CSN: [id number] Age: 71 Admit Type: Inpatient Procedure:                Upper GI endoscopy Indications:              Gastro-esophageal reflux disease, Nausea Providers:                Lajuan Lines. Hilarie Fredrickson, MD, Carmie End, RN, Cherylynn Ridges, Technician,Ally Carver CRNA Referring MD:             [name], MD Medicines:                Monitored Anesthesia Care Complications:            No immediate complications. Estimated Blood Loss:     Estimated blood loss was minimal. Procedure:                Pre-Anesthesia Assessment:                           - Prior to the procedure, a History and Physical                            was performed, and patient medications and                            allergies were reviewed. The patient's tolerance of                            previous anesthesia was also reviewed. The risks                            and benefits of the procedure and the sedation                            options and risks were discussed with the patient.                            All questions were answered, and informed consent                            was obtained. Prior Anticoagulants: The patient has                            taken Eliquis (apixaban), last dose was 2 days                            prior to procedure. ASA Grade Assessment: III - A                            patient with severe systemic disease. After  reviewing the risks and benefits, the patient was                            deemed in satisfactory condition to undergo the                            procedure.                           After obtaining informed consent, the endoscope was                            passed under direct vision. Throughout the                             procedure, the patient's blood pressure, pulse, and                            oxygen saturations were monitored continuously. The                            [id number]-[id number] ([id number]) Olympus endoscope was introduced                            through the mouth, and advanced to the second part                            of duodenum. The upper GI endoscopy was                            accomplished without difficulty. The patient                            tolerated the procedure well. Scope In: Scope Out: Findings:      A widely patent Schatzki ring was found at the gastroesophageal junction.      A 3 cm hiatal hernia was present.      The exam of the esophagus was otherwise normal.      Mild inflammation characterized by congestion (edema) and erythema was       found in the prepyloric region of the stomach. Biopsies were taken with       a cold forceps for histology and Helicobacter pylori testing.      The exam of the stomach was otherwise normal.      The examined duodenum was normal. Impression:               - Widely patent Schatzki ring.                           - 3 cm hiatal hernia.                           - Mild gastritis without erosion or ulceration.                            Biopsied.                           -  Normal examined duodenum. Moderate Sedation:      N/A Recommendation:           - Patient has a contact number available for                            emergencies. The signs and symptoms of potential                            delayed complications were discussed with the                            patient. Return to normal activities tomorrow.                            Written discharge instructions were provided to the                            patient.                           - Resume previous diet.                           - Continue present medications.                           - Await pathology results.                           - See the other  procedure note for documentation of                            additional recommendations. Procedure Code(s):        --- Professional ---                           [phone], Esophagogastroduodenoscopy, flexible,                            transoral; with biopsy, single or multiple Diagnosis Code(s):        --- Professional ---                           K22.2, Esophageal obstruction                           K44.9, Diaphragmatic hernia without obstruction or                            gangrene                           K29.70, Gastritis, unspecified, without bleeding                           K21.9, Gastro-esophageal reflux disease without  esophagitis                           R11.0, Nausea CPT copyright 2019 American [employer]. All rights reserved. The codes documented in this report are preliminary and upon coder review may  be revised to meet current compliance requirements. Jerene Bears, MD 03/20/2021 8:24:18 AM This report has been signed electronically. Number of Addenda: 0

## 2021-03-20 NOTE — Op Note (Addendum)
[facility] [place] Patient Name: April [name] Procedure Date: 03/20/2021 MRN: [id number] Attending MD: Jerene Bears , MD Date of Birth: 05-Sep-1950 CSN: [id number] Age: 71 Admit Type: Inpatient Procedure:                Colonoscopy Indications:              Screening patient at increased risk: Family history                            of colorectal cancer in multiple 1st-degree                            relatives, Last colonoscopy: May 2017 Providers:                Lajuan Lines. Hilarie Fredrickson, MD, Carmie End, RN, Cherylynn Ridges, Technician, Victoriano Lain Referring MD:             [name], MD Medicines:                Monitored Anesthesia Care Complications:            No immediate complications. Estimated Blood Loss:     Estimated blood loss: none. Procedure:                Pre-Anesthesia Assessment:                           - Prior to the procedure, a History and Physical                            was performed, and patient medications and                            allergies were reviewed. The patient's tolerance of                            previous anesthesia was also reviewed. The risks                            and benefits of the procedure and the sedation                            options and risks were discussed with the patient.                            All questions were answered, and informed consent                            was obtained. Prior Anticoagulants: The patient has                            taken Eliquis (apixaban), last dose was 2 days  prior to procedure. ASA Grade Assessment: III - A                            patient with severe systemic disease. After                            reviewing the risks and benefits, the patient was                            deemed in satisfactory condition to undergo the                            procedure.                           - Prior to the procedure, a  History and Physical                            was performed, and patient medications and                            allergies were reviewed. The patient's tolerance of                            previous anesthesia was also reviewed. The risks                            and benefits of the procedure and the sedation                            options and risks were discussed with the patient.                            All questions were answered, and informed consent                            was obtained. Prior Anticoagulants: The patient has                            taken Eliquis (apixaban), last dose was 2 days                            prior to procedure. ASA Grade Assessment: III - A                            patient with severe systemic disease. After                            reviewing the risks and benefits, the patient was                            deemed in satisfactory condition to undergo the  procedure.                           After obtaining informed consent, the colonoscope                            was passed under direct vision. Throughout the                            procedure, the patient's blood pressure, pulse, and                            oxygen saturations were monitored continuously. The                            [id number] ([id number]) Olympus colonoscope was                            introduced through the anus and advanced to the                            cecum, identified by appendiceal orifice and                            ileocecal valve. The colonoscopy was performed                            without difficulty. The patient tolerated the                            procedure well. The quality of the bowel                            preparation was good. The ileocecal valve,                            appendiceal orifice, and rectum were photographed. Scope In: 7:58:02 AM Scope Out: 8:09:43 AM Scope Withdrawal Time:  0 hours 9 minutes 51 seconds  Total Procedure Duration: 0 hours 11 minutes 41 seconds  Findings:      The digital rectal exam was normal.      A 2 mm polyp was found in the ascending colon. The polyp was sessile.       The polyp was removed with a cold biopsy forceps. Resection and       retrieval were complete.      Internal hemorrhoids were found during retroflexion. The hemorrhoids       were small.      The exam was otherwise without abnormality. Impression:               - One 2 mm polyp in the ascending colon, removed                            with a cold biopsy forceps. Resected and retrieved.                           -  Small internal hemorrhoids.                           - The examination was otherwise normal. Moderate Sedation:      N/A Recommendation:           - Patient has a contact number available for                            emergencies. The signs and symptoms of potential                            delayed complications were discussed with the                            patient. Return to normal activities tomorrow.                            Written discharge instructions were provided to the                            patient.                           - Resume previous diet.                           - Continue present medications.                           - Await pathology results.                           - No repeat colonoscopy due to age at next                            screening interval.                           - Resume Eliquis (apixaban) at prior dose today.                            Refer to managing physician for further adjustment                            of therapy. Procedure Code(s):        --- Professional ---                           [phone], Colonoscopy, flexible; with biopsy, single                            or multiple Diagnosis Code(s):        --- Professional ---                           Z80.0, Family history of malignant neoplasm  of  digestive organs                           K63.5, Polyp of colon                           K64.8, Other hemorrhoids CPT copyright 2019 American Medical Association. All rights reserved. The codes documented in this report are preliminary and upon coder review may  be revised to meet current compliance requirements. Jerene Bears, MD 03/20/2021 8:27:32 AM This report has been signed electronically. Number of Addenda: 0

## 2021-03-20 NOTE — Anesthesia Postprocedure Evaluation (Signed)
Anesthesia Post Note  Patient: April [name]  Procedure(s) Performed: ESOPHAGOGASTRODUODENOSCOPY (EGD) WITH PROPOFOL COLONOSCOPY WITH PROPOFOL BIOPSY POLYPECTOMY     Patient location during evaluation: PACU Anesthesia Type: MAC Level of consciousness: awake and alert Pain management: pain level controlled Vital Signs Assessment: post-procedure vital signs reviewed and stable Respiratory status: spontaneous breathing, nonlabored ventilation, respiratory function stable and patient connected to nasal cannula oxygen Cardiovascular status: stable and blood pressure returned to baseline Postop Assessment: no apparent nausea or vomiting Anesthetic complications: no   No notable events documented.  Last Vitals:  Vitals:   03/20/21 0826 03/20/21 0836  BP: (!) 113/57 (!) 103/59  Pulse: 81 72  Resp: 20 18  Temp:    SpO2: 100% 97%    Last Pain:  Vitals:   03/20/21 0836  TempSrc:   PainSc: 0-No pain                 Santa Lighter

## 2021-03-22 ENCOUNTER — Other Ambulatory Visit: Payer: Self-pay | Admitting: Internal Medicine

## 2021-03-24 ENCOUNTER — Other Ambulatory Visit: Payer: Self-pay

## 2021-03-24 ENCOUNTER — Telehealth: Payer: Self-pay | Admitting: Internal Medicine

## 2021-03-24 MED ORDER — NEBIVOLOL HCL 5 MG PO TABS: 5.0000 mg | ORAL_TABLET | Freq: Every day | ORAL | 3 refills | Status: DC

## 2021-03-24 NOTE — Telephone Encounter (Signed)
Inbound call from patient requesting a call back for glucose results.

## 2021-03-24 NOTE — Telephone Encounter (Signed)
Scripts sent in today

## 2021-03-24 NOTE — Telephone Encounter (Signed)
PT calls today in regards to gettingn some medication refilled prior to her appointment. She has ran out of both the Ferrous Sulfate 325mg  aswell as the Nebivolol 5mg .  If possible she would like these prescriptions made out the the [facility] on [address]!  CB: [phone]

## 2021-03-24 NOTE — Telephone Encounter (Signed)
Pt calling for her pathology results. Discussed with pt that they are not back yet and she will be called when they are reviewed by the doctor.

## 2021-03-25 DIAGNOSIS — J9612 Chronic respiratory failure with hypercapnia: Secondary | ICD-10-CM | POA: Diagnosis not present

## 2021-03-25 DIAGNOSIS — N1832 Chronic kidney disease, stage 3b: Secondary | ICD-10-CM | POA: Diagnosis not present

## 2021-03-25 DIAGNOSIS — D631 Anemia in chronic kidney disease: Secondary | ICD-10-CM | POA: Diagnosis not present

## 2021-03-25 DIAGNOSIS — E1122 Type 2 diabetes mellitus with diabetic chronic kidney disease: Secondary | ICD-10-CM | POA: Diagnosis not present

## 2021-03-25 DIAGNOSIS — J9611 Chronic respiratory failure with hypoxia: Secondary | ICD-10-CM | POA: Diagnosis not present

## 2021-03-25 DIAGNOSIS — I130 Hypertensive heart and chronic kidney disease with heart failure and stage 1 through stage 4 chronic kidney disease, or unspecified chronic kidney disease: Secondary | ICD-10-CM | POA: Diagnosis not present

## 2021-03-25 DIAGNOSIS — E1151 Type 2 diabetes mellitus with diabetic peripheral angiopathy without gangrene: Secondary | ICD-10-CM | POA: Diagnosis not present

## 2021-03-25 DIAGNOSIS — I5033 Acute on chronic diastolic (congestive) heart failure: Secondary | ICD-10-CM | POA: Diagnosis not present

## 2021-03-25 DIAGNOSIS — I87301 Chronic venous hypertension (idiopathic) without complications of right lower extremity: Secondary | ICD-10-CM | POA: Diagnosis not present

## 2021-03-25 LAB — SURGICAL PATHOLOGY

## 2021-03-26 ENCOUNTER — Telehealth: Payer: Self-pay

## 2021-03-26 ENCOUNTER — Encounter: Payer: Self-pay | Admitting: Internal Medicine

## 2021-03-26 DIAGNOSIS — J9611 Chronic respiratory failure with hypoxia: Secondary | ICD-10-CM | POA: Diagnosis not present

## 2021-03-26 DIAGNOSIS — N1832 Chronic kidney disease, stage 3b: Secondary | ICD-10-CM | POA: Diagnosis not present

## 2021-03-26 DIAGNOSIS — E1151 Type 2 diabetes mellitus with diabetic peripheral angiopathy without gangrene: Secondary | ICD-10-CM | POA: Diagnosis not present

## 2021-03-26 DIAGNOSIS — I130 Hypertensive heart and chronic kidney disease with heart failure and stage 1 through stage 4 chronic kidney disease, or unspecified chronic kidney disease: Secondary | ICD-10-CM | POA: Diagnosis not present

## 2021-03-26 DIAGNOSIS — I87301 Chronic venous hypertension (idiopathic) without complications of right lower extremity: Secondary | ICD-10-CM | POA: Diagnosis not present

## 2021-03-26 DIAGNOSIS — D631 Anemia in chronic kidney disease: Secondary | ICD-10-CM | POA: Diagnosis not present

## 2021-03-26 DIAGNOSIS — J9612 Chronic respiratory failure with hypercapnia: Secondary | ICD-10-CM | POA: Diagnosis not present

## 2021-03-26 DIAGNOSIS — I5033 Acute on chronic diastolic (congestive) heart failure: Secondary | ICD-10-CM | POA: Diagnosis not present

## 2021-03-26 DIAGNOSIS — E1122 Type 2 diabetes mellitus with diabetic chronic kidney disease: Secondary | ICD-10-CM | POA: Diagnosis not present

## 2021-03-26 NOTE — Telephone Encounter (Signed)
April [name] is requesting Verbal orders for PT for  1 time a week for 8 weeks.  Please advise [phone]

## 2021-03-26 NOTE — Telephone Encounter (Signed)
Verbal left today for [employer]

## 2021-03-27 DIAGNOSIS — E1151 Type 2 diabetes mellitus with diabetic peripheral angiopathy without gangrene: Secondary | ICD-10-CM | POA: Diagnosis not present

## 2021-03-27 DIAGNOSIS — D631 Anemia in chronic kidney disease: Secondary | ICD-10-CM | POA: Diagnosis not present

## 2021-03-27 DIAGNOSIS — I5033 Acute on chronic diastolic (congestive) heart failure: Secondary | ICD-10-CM | POA: Diagnosis not present

## 2021-03-27 DIAGNOSIS — N1832 Chronic kidney disease, stage 3b: Secondary | ICD-10-CM | POA: Diagnosis not present

## 2021-03-27 DIAGNOSIS — E1122 Type 2 diabetes mellitus with diabetic chronic kidney disease: Secondary | ICD-10-CM | POA: Diagnosis not present

## 2021-03-27 DIAGNOSIS — J9611 Chronic respiratory failure with hypoxia: Secondary | ICD-10-CM | POA: Diagnosis not present

## 2021-03-27 DIAGNOSIS — I130 Hypertensive heart and chronic kidney disease with heart failure and stage 1 through stage 4 chronic kidney disease, or unspecified chronic kidney disease: Secondary | ICD-10-CM | POA: Diagnosis not present

## 2021-03-27 DIAGNOSIS — I87301 Chronic venous hypertension (idiopathic) without complications of right lower extremity: Secondary | ICD-10-CM | POA: Diagnosis not present

## 2021-03-27 DIAGNOSIS — J9612 Chronic respiratory failure with hypercapnia: Secondary | ICD-10-CM | POA: Diagnosis not present

## 2021-03-31 ENCOUNTER — Other Ambulatory Visit: Payer: Self-pay | Admitting: Internal Medicine

## 2021-03-31 ENCOUNTER — Telehealth: Payer: Self-pay | Admitting: Internal Medicine

## 2021-03-31 ENCOUNTER — Telehealth: Payer: Self-pay

## 2021-03-31 DIAGNOSIS — D631 Anemia in chronic kidney disease: Secondary | ICD-10-CM | POA: Diagnosis not present

## 2021-03-31 DIAGNOSIS — J9611 Chronic respiratory failure with hypoxia: Secondary | ICD-10-CM | POA: Diagnosis not present

## 2021-03-31 DIAGNOSIS — E1151 Type 2 diabetes mellitus with diabetic peripheral angiopathy without gangrene: Secondary | ICD-10-CM | POA: Diagnosis not present

## 2021-03-31 DIAGNOSIS — I130 Hypertensive heart and chronic kidney disease with heart failure and stage 1 through stage 4 chronic kidney disease, or unspecified chronic kidney disease: Secondary | ICD-10-CM | POA: Diagnosis not present

## 2021-03-31 DIAGNOSIS — N1832 Chronic kidney disease, stage 3b: Secondary | ICD-10-CM | POA: Diagnosis not present

## 2021-03-31 DIAGNOSIS — J9612 Chronic respiratory failure with hypercapnia: Secondary | ICD-10-CM | POA: Diagnosis not present

## 2021-03-31 DIAGNOSIS — I5033 Acute on chronic diastolic (congestive) heart failure: Secondary | ICD-10-CM | POA: Diagnosis not present

## 2021-03-31 DIAGNOSIS — E1122 Type 2 diabetes mellitus with diabetic chronic kidney disease: Secondary | ICD-10-CM | POA: Diagnosis not present

## 2021-03-31 DIAGNOSIS — I87301 Chronic venous hypertension (idiopathic) without complications of right lower extremity: Secondary | ICD-10-CM | POA: Diagnosis not present

## 2021-03-31 NOTE — Telephone Encounter (Signed)
Pt is requesting a refill on: MOUNJARO 2.5 MG/0.5ML Pen  Pharmacy: [facility] [place], [address] AT [address] 01/17/21 ROV 04/25/21  Pt CB [phone]

## 2021-03-31 NOTE — Telephone Encounter (Signed)
Script sent in already

## 2021-03-31 NOTE — Telephone Encounter (Signed)
Both pathology letter reviewed with pt and questions were answered.

## 2021-03-31 NOTE — Telephone Encounter (Signed)
Patient called to state she still has not received the pathology results from her recent procedures.  She was informed a letter was mailed out to her on 2/22, but she would like someone to call and verbally give her the results.  Thank you.

## 2021-04-04 ENCOUNTER — Ambulatory Visit (INDEPENDENT_AMBULATORY_CARE_PROVIDER_SITE_OTHER): Payer: Medicare PPO

## 2021-04-04 ENCOUNTER — Encounter: Payer: Self-pay | Admitting: Nurse Practitioner

## 2021-04-04 ENCOUNTER — Other Ambulatory Visit: Payer: Self-pay

## 2021-04-04 ENCOUNTER — Ambulatory Visit: Payer: Medicare PPO | Admitting: Nurse Practitioner

## 2021-04-04 VITALS — BP 130/76 | HR 93 | Temp 98.2°F | Ht 62.0 in | Wt 348.0 lb

## 2021-04-04 DIAGNOSIS — J9612 Chronic respiratory failure with hypercapnia: Secondary | ICD-10-CM

## 2021-04-04 DIAGNOSIS — J9611 Chronic respiratory failure with hypoxia: Secondary | ICD-10-CM

## 2021-04-04 DIAGNOSIS — J45909 Unspecified asthma, uncomplicated: Secondary | ICD-10-CM | POA: Diagnosis not present

## 2021-04-04 DIAGNOSIS — I5032 Chronic diastolic (congestive) heart failure: Secondary | ICD-10-CM

## 2021-04-04 DIAGNOSIS — E538 Deficiency of other specified B group vitamins: Secondary | ICD-10-CM

## 2021-04-04 DIAGNOSIS — R051 Acute cough: Secondary | ICD-10-CM

## 2021-04-04 DIAGNOSIS — E662 Morbid (severe) obesity with alveolar hypoventilation: Secondary | ICD-10-CM

## 2021-04-04 DIAGNOSIS — R0609 Other forms of dyspnea: Secondary | ICD-10-CM | POA: Diagnosis not present

## 2021-04-04 DIAGNOSIS — J309 Allergic rhinitis, unspecified: Secondary | ICD-10-CM

## 2021-04-04 DIAGNOSIS — R059 Cough, unspecified: Secondary | ICD-10-CM | POA: Diagnosis not present

## 2021-04-04 LAB — CBC WITH DIFFERENTIAL/PLATELET
Basophils Absolute: 0.1 10*3/uL (ref 0.0–0.1)
Basophils Relative: 1.5 % (ref 0.0–3.0)
Eosinophils Absolute: 0.1 10*3/uL (ref 0.0–0.7)
Eosinophils Relative: 1.4 % (ref 0.0–5.0)
HCT: 38.9 % (ref 36.0–46.0)
Hemoglobin: 12.5 g/dL (ref 12.0–15.0)
Lymphocytes Relative: 21.6 % (ref 12.0–46.0)
Lymphs Abs: 1.2 10*3/uL (ref 0.7–4.0)
MCHC: 32.2 g/dL (ref 30.0–36.0)
MCV: 99 fl (ref 78.0–100.0)
Monocytes Absolute: 0.4 10*3/uL (ref 0.1–1.0)
Monocytes Relative: 6.6 % (ref 3.0–12.0)
Neutro Abs: 4 10*3/uL (ref 1.4–7.7)
Neutrophils Relative %: 68.9 % (ref 43.0–77.0)
Platelets: 199 10*3/uL (ref 150.0–400.0)
RBC: 3.93 Mil/uL (ref 3.87–5.11)
RDW: 16.8 % — ABNORMAL HIGH (ref 11.5–15.5)
WBC: 5.7 10*3/uL (ref 4.0–10.5)

## 2021-04-04 LAB — BASIC METABOLIC PANEL
BUN: 15 mg/dL (ref 6–23)
CO2: 37 mEq/L — ABNORMAL HIGH (ref 19–32)
Calcium: 9.6 mg/dL (ref 8.4–10.5)
Chloride: 96 mEq/L (ref 96–112)
Creatinine, Ser: 1.45 mg/dL — ABNORMAL HIGH (ref 0.40–1.20)
GFR: 36.46 mL/min — ABNORMAL LOW (ref >60.00)
Glucose, Bld: 116 mg/dL — ABNORMAL HIGH (ref 70–99)
Potassium: 3.6 mEq/L (ref 3.5–5.1)
Sodium: 139 mEq/L (ref 135–145)

## 2021-04-04 LAB — BRAIN NATRIURETIC PEPTIDE: Pro B Natriuretic peptide (BNP): 40 pg/mL (ref 0.0–100.0)

## 2021-04-04 MED ORDER — BREZTRI AEROSPHERE 160-9-4.8 MCG/ACT IN AERO: 2.0000 | INHALATION_SPRAY | Freq: Two times a day (BID) | RESPIRATORY_TRACT | 0 refills | Status: DC

## 2021-04-04 MED ORDER — FLUTICASONE PROPIONATE 50 MCG/ACT NA SUSP: 2.0000 | Freq: Every day | NASAL | 2 refills | Status: AC

## 2021-04-04 MED ORDER — SPACER/AERO-HOLDING CHAMBERS DEVI: 2 refills | Status: AC

## 2021-04-04 MED ORDER — BENZONATATE 200 MG PO CAPS: 200.0000 mg | ORAL_CAPSULE | Freq: Three times a day (TID) | ORAL | 1 refills | Status: DC | PRN

## 2021-04-04 MED ORDER — CYANOCOBALAMIN 1000 MCG/ML IJ SOLN
1000.0000 ug | Freq: Once | INTRAMUSCULAR | Status: AC
  Administered 2021-04-04: 1000 ug via INTRAMUSCULAR

## 2021-04-04 MED ORDER — LORATADINE 10 MG PO TABS: 10.0000 mg | ORAL_TABLET | Freq: Every day | ORAL | 5 refills | Status: DC

## 2021-04-04 NOTE — Progress Notes (Signed)
@Patient  ID: April [name], female    DOB: 1950-05-11, 71 y.o.   MRN: [id number]  Chief Complaint  Patient presents with   Follow-up    Here to get oxygen for the day time. She states she is on 2L during the night. She states that she is having to wear her oxygen during the day now.     Referring provider: [name], MD  HPI: 70 year old female, never smoker followed for nocturnal hypoxemia, pulmonary HTN, obesity hypoventilation syndrome, and asthma. She is a patient of April [name] and was last seen in office 01/03/2021 by April Guerin,NP. Past medical history significant for CHF, former DVT on chronic anticoagulation, HTN, CAD, GERD, PUP, DM II, morbid obesity, CKD stage 3, HLD.  TEST/EVENTS:  05/07/2020 CTA chest: mild cardiomegaly suggestive of PA HTN. Atherosclerosis. No lymphadenopathy. Minimal ground-glass opacity lung bases. Slight suboptimal opacification of main PA without central or proximal segmental PE. Small hiatal hernia.  11/15/2020 CXR 2 view: low lung volumes. Cardiac silhouette mildly enlarged. Stable mild appearance of b/l perihilar pulmonary vasculature. Moderate severity calcification of aortic arch. No acute process  11/18/2020 echocardiogram: EF 65-70%. LVH. GIDD. Interventricular septum flattened c/w RV pressure and volume overload 11/19/2020 right heart cath: mild to moderate pulmonary HTN. Minimally elevated PCWP. Normal CO 12/09/2020 split night study: negative for OSA. Nocturnal desaturations to 84% requiring 1 lpm O2. Recommended continued use of BiPAP for CO2 blow off and 1 L supplemental O2 at night.   10/02/2020: OV with April [name]. Scheduled in lab split-night. Lunesta low dose for sleep. Weight loss advised but limits with severe arthritis. Encouraged to decrease naps and consolidate sleep at night. F/u in 3-4 months.    11/15/2020 - 11/23/2018: Hospital admission for acute on chronic CHF exacerbation and acute on chronic hypoxic hypercapnic respiratory  failure.  pH 7.29 PCO2 of 80 and PO2 of 60. IV diuresis with Lasix.  Right heart cath on 10/19.  PCCM consulted with recommendations for BiPAP versus NIPPV upon discharge.  01/03/2021: OV with April Fludd NP for follow up after sleep study and overdue hospital follow up. Breathing stable. Continued on BiPAP and supplemental oxygen at night 1 lpm. Continued on Symbicort for maintenance. PRN Ventolin. Singulair at bedtime.   04/04/2021: Today - follow up Patient presents today with husband for follow-up.  Since we saw her last, she reports that she has been having to use her oxygen during the day and not just at night.  She states that she has noticed progressive worsening of her shortness of breath. She has also noticed some drops in her oxygen levels into the 80s with exertion.  She has been wearing 2 L/min.  This has been ongoing for a few months. She continues to wear nocturnal oxygen at 1 L/min as well as her BiPAP.  She has also had a slight increase in her cough over the past week or two.  She is concerned that her asthma is flaring as she has noticed an occasional wheeze. She does blow her nose frequently.  Denies any any sinus pressure or other allergy type symptoms.  She denies any worsening swelling in her legs, orthopnea, PND, chest pain.  She continues on Symbicort twice a day.  She is using her rescue occasionally.  Allergies  Allergen Reactions   Penicillins Rash and Other (See Comments)    She was told not to take it anymore. Has patient had a PCN reaction causing immediate rash, facial/tongue/throat swelling, SOB or lightheadedness with  hypotension: Yes Has patient had a PCN reaction causing severe rash involving mucus membranes or skin necrosis: No Has patient had a PCN reaction that required hospitalization: No Has patient had a PCN reaction occurring within the last 10 years: No If all of the above answers are "NO", then may proceed with Cephalosporin use.'   Shellfish Allergy Anaphylaxis    Sulfonamide Derivatives Anaphylaxis and Other (See Comments)   Hydrochlorothiazide W-Triamterene Other (See Comments)    Hypokalemia   Lotensin [Benazepril Hcl] Hives   Dipyridamole Other (See Comments)    Unknown reaction   Estrogens Other (See Comments)    Unknown reaction   Hydrochlorothiazide Other (See Comments)    Unknown reaction, Hypokalemia?   Latex Other (See Comments)    Unknown reaction - stated previously during surgery gloves had to be changed, but unsure if it is still an allergy or not.    Metronidazole Other (See Comments)    Unknown reaction   Other Itching    PICKLES   Spironolactone Other (See Comments)    UNSPECIFIED > "kidney problems"   Torsemide Other (See Comments)    Unknown reaction   Valsartan Other (See Comments)    Unknown reaction   Mustard [Allyl Isothiocyanate] Itching    Immunization History  Administered Date(s) Administered   Fluad Quad(high Dose 65+) 10/28/2018, 10/18/2020   Influenza Split 12/03/2010, 11/03/2011   Influenza Whole 11/16/2006, 11/22/2007, 10/31/2008, 10/29/2009   Influenza, High Dose Seasonal PF 10/10/2015, 11/06/2016, 11/16/2017, 11/08/2019   Influenza,inj,Quad PF,6+ Mos 10/19/2012, 10/25/2013, 10/29/2014   Influenza-Unspecified 10/23/2016, 11/18/2016   Moderna Sars-Covid-2 Vaccination 03/13/2019, 04/10/2019, 12/06/2019   Pfizer Covid-19 Vaccine Bivalent Booster 28yrs & up 11/08/2020   Pneumococcal Conjugate-13 06/07/2015   Pneumococcal Polysaccharide-23 03/24/2005, 01/04/2012, 06/29/2017   Td 06/24/2009   Tdap 10/09/2014   Zoster Recombinat (Shingrix) 08/11/2016, 10/23/2016   Zoster, Live 01/15/2012    Past Medical History:  Diagnosis Date   Anemia    Asthma    CAD (coronary artery disease)    Carpal tunnel syndrome    Cellulitis of both lower extremities 04/11/2015   CHF (congestive heart failure) (HCC)    Chronic kidney disease    CKI- followed by [facility]   Colon polyp, hyperplastic 4270 & [id number]    Complication of anesthesia 1999   svt with renal calculi surgery, no problems since   CTS (carpal tunnel syndrome)    bilateral   Diabetes mellitus    Eczema    GERD (gastroesophageal reflux disease)    History of kidney stones 1999   Hyperlipidemia    Hypertension    Leg ulcer ([place]) 04/24/2015   Right lateral leg No evidence of an infection Monitor closely Keep edema controlled    Leg ulcer ([place])    right lower   Meralgia paresthetica    Dr. [name]   Morbid obesity ([place])    Neuropathy    toes and legs   Osteoarthrosis, unspecified whether generalized or localized, lower leg    knee   PUD (peptic ulcer disease)    Shortness of breath dyspnea    with exertion   Sleep apnea    per progress note 02/25/2018   Type II or unspecified type diabetes mellitus without mention of complication, not stated as uncontrolled    Unspecified hereditary and idiopathic peripheral neuropathy    Urticaria    Vitamin B12 deficiency    Wears glasses    Wound cellulitis    right upper leg, healing well  Tobacco History: Social History   Tobacco Use  Smoking Status Never  Smokeless Tobacco Never   Counseling given: Not Answered   Outpatient Medications Prior to Visit  Medication Sig Dispense Refill   acetaminophen (TYLENOL) 650 MG CR tablet Take 1,300 mg by mouth every 8 (eight) hours as needed for pain.     Alcohol Swabs (DROPSAFE ALCOHOL PREP) 70 % PADS USE TOPICALLY AS DIRECTED 300 each 3   allopurinol (ZYLOPRIM) 100 MG tablet Take 2 tablets (200 mg total) by mouth daily. (Patient taking differently: Take 200 mg by mouth daily as needed (gout).) 180 tablet 1   apixaban (ELIQUIS) 5 MG TABS tablet Take 1 tablet (5 mg total) by mouth 2 (two) times daily. 60 tablet 1   atorvastatin (LIPITOR) 40 MG tablet Take 1 tablet (40 mg total) by mouth at bedtime. 90 tablet 2   Blood Glucose Calibration (TRUE METRIX LEVEL 1) Low SOLN UAD  E11.9 1 each 3   Blood Glucose Monitoring Suppl (TRUE METRIX  METER) DEVI True Metrix Level 1 solution     budesonide-formoterol (SYMBICORT) 160-4.5 MCG/ACT inhaler one - two inhalations every 12 hours; gargle and spit after use 1 Inhaler 4   calcium carbonate (TUMS - DOSED IN MG ELEMENTAL CALCIUM) 500 MG chewable tablet Chew 2 tablets by mouth daily as needed for indigestion or heartburn.     cholecalciferol (VITAMIN D3) 25 MCG (1000 UNIT) tablet Take 1 tablet (1,000 Units total) by mouth daily. 90 tablet 1   cyanocobalamin (,VITAMIN B-12,) 1000 MCG/ML injection INJECT 1ML INTO MUSCLE EVERY 30 DAYS 10 mL 5   dapagliflozin propanediol (FARXIGA) 10 MG TABS tablet Take 10 mg by mouth daily.     diclofenac Sodium (VOLTAREN) 1 % GEL Apply 1 application topically as needed (pain).     diphenhydrAMINE (BENADRYL) 12.5 MG/5ML liquid Take 25 mg by mouth 4 (four) times daily as needed for allergies.     eszopiclone (LUNESTA) 1 MG TABS tablet Take 1 mg by mouth at bedtime as needed for sleep. Take immediately before bedtime     famotidine (PEPCID) 20 MG tablet Take 1 tablet (20 mg total) by mouth 2 (two) times daily. 180 tablet 3   FEROSUL 325 (65 Fe) MG tablet TAKE 1 TABLET BY MOUTH EVERY MONDAY, WEDNESDAY, AND FRIDAY 60 tablet 0   gabapentin (NEURONTIN) 100 MG capsule TAKE 1 CAPSULE(100 MG) BY MOUTH THREE TIMES DAILY 90 capsule 1   guaiFENesin-dextromethorphan (ROBITUSSIN DM) 100-10 MG/5ML syrup Take 5 mLs by mouth every 4 (four) hours as needed for cough.     hydrOXYzine (ATARAX/VISTARIL) 25 MG tablet Take 1 tablet (25 mg total) by mouth 3 (three) times daily as needed for itching. 30 tablet 0   montelukast (SINGULAIR) 10 MG tablet TAKE 1 TABLET(10 MG) BY MOUTH AT BEDTIME 30 tablet 5   MOUNJARO 2.5 MG/0.5ML Pen ADMINISTER 0.5 ML UNDER THE SKIN 1 TIME A WEEK 2 mL 0   nebivolol (BYSTOLIC) 5 MG tablet Take 1 tablet (5 mg total) by mouth daily. 30 tablet 3   Oxcarbazepine (TRILEPTAL) 300 MG tablet Take 1 tablet (300 mg total) by mouth 2 (two) times daily. 180 tablet 1    oxyCODONE (OXY IR/ROXICODONE) 5 MG immediate release tablet Take 5 mg by mouth every 6 (six) hours as needed for severe pain.     polyethylene glycol (MIRALAX / GLYCOLAX) 17 g packet Take 17 g by mouth daily as needed for moderate constipation.     potassium chloride SA (KLOR-CON  M) 20 MEQ tablet Take 1 tablet (20 mEq total) by mouth daily. 90 tablet 1   spironolactone (ALDACTONE) 25 MG tablet TAKE 1 TABLET(25 MG) BY MOUTH DAILY 90 tablet 0   torsemide (DEMADEX) 20 MG tablet Take 40 mg by mouth 2 (two) times daily.     VENTOLIN HFA 108 (90 Base) MCG/ACT inhaler Inhale 2 puffs into the lungs every 4 (four) hours as needed for wheezing.     No facility-administered medications prior to visit.     Review of Systems:   Constitutional: No weight loss or gain, night sweats, fevers, chills, fatigue, or lassitude. HEENT: No headaches, difficulty swallowing, tooth/dental problems, or sore throat. No sneezing, itching, ear ache. +occasional nasal congestion/runny nose CV:  No chest pain, orthopnea, PND, swelling in lower extremities, anasarca, dizziness, palpitations, syncope Resp: +dry cough, chest congestion, occasional wheeze; shortness of breath with exertion; decreased oxygen saturations. No excess mucus or change in color of mucus. No hemoptysis. No chest wall deformity GI:  No heartburn, indigestion, abdominal pain, nausea, vomiting, diarrhea, change in bowel habits, loss of appetite, bloody stools.  GU: No dysuria, change in color of urine, urgency or frequency.  No flank pain, no hematuria  Skin: No rash, lesions, ulcerations MSK:  No joint pain or swelling.  No decreased range of motion.  No back pain. Neuro: No dizziness or lightheadedness.  Psych: No depression or anxiety. Mood stable.     Physical Exam:  BP 130/76 (BP Location: Left Arm, Patient Position: Sitting, Cuff Size: Normal)    Pulse 93    Temp 98.2 F (36.8 C) (Oral)    Ht 5\' 2"  (1.575 m)    Wt (!) 348 lb (157.9 kg)    SpO2  92%    BMI 63.65 kg/m   GEN: Pleasant, interactive, chronically-ill appearing; morbid obesity; in no acute distress HEENT:  Normocephalic and atraumatic. EACs patent bilaterally. TM pearly gray with present light reflex bilaterally. PERRLA. Sclera white. Nasal turbinates erythematous, moist and patent bilaterally. Clear rhinorrhea present. Oropharynx erythematous and moist, without exudate or edema. No lesions, ulcerations, or postnasal drip.  NECK:  Supple w/ fair ROM. No JVD present. Normal carotid impulses w/o bruits. Thyroid symmetrical with no goiter or nodules palpated. No lymphadenopathy.   CV: RRR, no m/r/g, no peripheral edema. Pulses intact, +2 bilaterally. No cyanosis, pallor or clubbing. PULMONARY:  Unlabored, regular breathing. Clear bilaterally A&P w/o wheezes/rales/rhonchi. No accessory muscle use. No dullness to percussion. GI: BS present and normoactive. Soft, non-tender to palpation. No organomegaly or masses detected. No CVA tenderness. MSK: No erythema, warmth or tenderness. Cap refil <2 sec all extrem. No deformities or joint swelling noted.  Neuro: A/Ox3. No focal deficits noted.   Skin: Warm, no lesions or rashe Psych: Normal affect and behavior. Judgement and thought content appropriate.     Lab Results:  CBC    Component Value Date/Time   WBC 5.1 03/19/2021 1745   RBC 3.85 (L) 03/19/2021 1745   HGB 12.0 03/19/2021 1745   HGB 12.2 12/31/2020 0937   HCT 39.0 03/19/2021 1745   PLT 173 03/19/2021 1745   PLT 176 12/31/2020 0937   MCV 101.3 (H) 03/19/2021 1745   MCH 31.2 03/19/2021 1745   MCHC 30.8 03/19/2021 1745   RDW 15.9 (H) 03/19/2021 1745   LYMPHSABS 1.3 03/19/2021 1745   MONOABS 0.4 03/19/2021 1745   EOSABS 0.1 03/19/2021 1745   BASOSABS 0.1 03/19/2021 1745    BMET    Component Value Date/Time  NA 135 03/19/2021 1745   NA 145 (H) 10/25/2012 1117   K 3.8 03/19/2021 1745   CL 93 (L) 03/19/2021 1745   CO2 34 (H) 03/19/2021 1745   GLUCOSE 86  03/19/2021 1745   BUN 16 03/19/2021 1745   BUN 24 10/25/2012 1117   CREATININE 1.56 (H) 03/19/2021 1745   CREATININE 1.52 (H) 12/31/2020 0937   CALCIUM 8.9 03/19/2021 1745   GFRNONAA 36 (L) 03/19/2021 1745   GFRNONAA 37 (L) 12/31/2020 0937   GFRAA 44 (L) 02/28/2019 0252    BNP    Component Value Date/Time   BNP 96.7 01/10/2021 1114     Imaging:  DG Chest 2 View  Result Date: 04/04/2021 CLINICAL DATA:  Cough EXAM: CHEST - 2 VIEW COMPARISON:  11/20/2020 FINDINGS: Frontal and lateral views of the chest demonstrate an unremarkable cardiac silhouette. No airspace disease, effusion, or pneumothorax. No acute bony abnormalities. IMPRESSION: 1. No acute airspace disease. Electronically Signed   By: Randa Ngo M.D.   On: 04/04/2021 15:16    cyanocobalamin ((VITAMIN B-12)) injection 1,000 mcg     Date Action Dose Route User   04/04/2021 1021 Given 1,000 mcg Intramuscular (Right Deltoid) Tastet, Macario Carls, CMA       No flowsheet data found.  No results found for: NITRICOXIDE   04/04/2021: Walking oximetry with desaturation to 84% on room air; corrected to 91% with 3 lpm POC and maintained sats in the 90's on this.   Assessment & Plan:   Intrinsic asthma Suspect DOE multifactorial r/t CHF, obesity, deconditioning, asthma. No evidence of significant bronchospasm on exam. Unable to complete FeNO. Cough consistent more so with upper airway irritation based on exam findings. However, given array of symptoms, will trial step up to triple therapy regimen with Breztri - samples provided today. Advised to use with spacer. Continue singulair at night. Labs today including CBC with diff - assess eosinophils. CXR today without acute process or superimposed infection. PFTs ordered for further evaluation.  Patient Instructions  -Continue on BiPAP auto 5-20, pressure support of 4 with backup rate of 10 for CO2 blow off and to reduce your risk of rehospitalization  Use for a minimum 6 hours a  night, with goal of using entire night.  Change equipment every 30 days or as directed by DME.  Maintain clean equipment, as directed by home health agency.  Be aware of reduced alertness and do not drive or operate heavy machinery if experiencing this or drowsiness.  Exercise encouraged, as tolerated. Healthy weight management discussed.  Avoid or decrease alcohol consumption and medications that make you more sleepy, if possible. -Continue nighttime supplemental O2 therapy at 1L.  -Start 3 lpm supplemental oxygen on POC with activity. Goal oxygen >88-90%. Notify of increasing requirements.    -Stop Symbicort. Trial Breztri 2 puffs Twice daily for 2 weeks. If you do not notice a significant change in your breathing, cough or wheezing, can return to Symbicort. Brush tongue and rinse mouth afterwards.  -Change Pulmicort inhaler to 2 puffs twice a day as needed for shortness of breath or wheezing  -Continue Ventolin inhaler 2 puffs as needed every 4 hours as needed for shortness of breath or wheezing  -Continue Singulair 10 mg At bedtime  -Claritin 10 mg daily  -Flonase 2 sprays each nostril daily  -Mucinex 600 mg Twice daily for chest congestion -Delsym 2 tsp Twice daily for cough -Tessalon Perles 1 capsule Three times a day as needed for cough  Chest x ray  today. We will notify you of any abnormal results.   Labs today - BNP, BMET, CBC with diff   Follow up in 1 month with April [name]. If symptoms do not improve or worsen, please contact office for sooner follow up or seek emergency care.    Chronic diastolic (congestive) heart failure (HCC) Appears overall compensated on exam. No worsening lower extremity swelling. Will check BNP and BMET today. CXR without any overt pulmonary edema. Follow up with cardiology as scheduled.   Obesity hypoventilation syndrome (HCC) Continue on BiPAP therapy at night with supplemental O2 1 lpm.   Chronic respiratory failure with hypoxia and  hypercapnia (HCC) New oxygen requirements over the past few months. Qualified for 3 lpm POC today. Suspect multifactorial. Will assess BNP for worsening HF and CBC with diff to assess for anemia as pt is on chronic anticoagulation therapy. Goal SpO2 >88-90%   I spent 45 minutes of dedicated to the care of this patient on the date of this encounter to include pre-visit review of records, face-to-face time with the patient discussing conditions above, post visit ordering of testing, clinical documentation with the electronic health record, making appropriate referrals as documented, and communicating necessary findings to members of the patients care team.  Clayton Bibles, NP 04/04/2021  Pt aware and understands NP's role.

## 2021-04-04 NOTE — Assessment & Plan Note (Signed)
Continue on BiPAP therapy at night with supplemental O2 1 lpm.  ?

## 2021-04-04 NOTE — Progress Notes (Signed)
Pt here for monthly B12 injection per Dr.Burns ? ?B12 1040mcg given IM, and pt tolerated injection well. ? ?Will call to schedule next appt. ?

## 2021-04-04 NOTE — Assessment & Plan Note (Signed)
Appears overall compensated on exam. No worsening lower extremity swelling. Will check BNP and BMET today. CXR without any overt pulmonary edema. Follow up with cardiology as scheduled.  ?

## 2021-04-04 NOTE — Patient Instructions (Addendum)
-  Continue on BiPAP auto 5-20, pressure support of 4 with backup rate of 10 for CO2 blow off and to reduce your risk of rehospitalization  ?Use for a minimum 6 hours a night, with goal of using entire night.  ?Change equipment every 30 days or as directed by DME.  ?Maintain clean equipment, as directed by home health agency.  ?Be aware of reduced alertness and do not drive or operate heavy machinery if experiencing this or drowsiness.  ?Exercise encouraged, as tolerated. ?Healthy weight management discussed.  ?Avoid or decrease alcohol consumption and medications that make you more sleepy, if possible. ?-Continue nighttime supplemental O2 therapy at 1L.  ?-Start 3 lpm supplemental oxygen on POC with activity. Goal oxygen >88-90%. Notify of increasing requirements.  ?  ?-Stop Symbicort. Trial Breztri 2 puffs Twice daily for 2 weeks. If you do not notice a significant change in your breathing, cough or wheezing, can return to Symbicort. Brush tongue and rinse mouth afterwards.  ?-Change Pulmicort inhaler to 2 puffs twice a day as needed for shortness of breath or wheezing  ?-Continue Ventolin inhaler 2 puffs as needed every 4 hours as needed for shortness of breath or wheezing  ?-Continue Singulair 10 mg At bedtime ? ?-Claritin 10 mg daily  ?-Flonase 2 sprays each nostril daily  ?-Mucinex 600 mg Twice daily for chest congestion ?-Delsym 2 tsp Twice daily for cough ?-Tessalon Perles 1 capsule Three times a day as needed for cough ? ?Chest x ray today. We will notify you of any abnormal results.  ? ?Labs today - BNP, BMET, CBC with diff ?  ?Follow up in 1 month with Dr. Ander Slade. If symptoms do not improve or worsen, please contact office for sooner follow up or seek emergency care. ?

## 2021-04-04 NOTE — Assessment & Plan Note (Addendum)
Suspect DOE multifactorial r/t CHF, obesity, deconditioning, asthma. No evidence of significant bronchospasm on exam. Unable to complete FeNO. Cough consistent more so with upper airway irritation based on exam findings. However, given array of symptoms, will trial step up to triple therapy regimen with Breztri - samples provided today. Advised to use with spacer. Continue singulair at night. Labs today including CBC with diff - assess eosinophils. CXR today without acute process or superimposed infection. PFTs ordered for further evaluation. ? ?Patient Instructions  ?-Continue on BiPAP auto 5-20, pressure support of 4 with backup rate of 10 for CO2 blow off and to reduce your risk of rehospitalization  ?Use for a minimum 6 hours a night, with goal of using entire night.  ?Change equipment every 30 days or as directed by DME.  ?Maintain clean equipment, as directed by home health agency.  ?Be aware of reduced alertness and do not drive or operate heavy machinery if experiencing this or drowsiness.  ?Exercise encouraged, as tolerated. ?Healthy weight management discussed.  ?Avoid or decrease alcohol consumption and medications that make you more sleepy, if possible. ?-Continue nighttime supplemental O2 therapy at 1L.  ?-Start 3 lpm supplemental oxygen on POC with activity. Goal oxygen >88-90%. Notify of increasing requirements.  ?  ?-Stop Symbicort. Trial Breztri 2 puffs Twice daily for 2 weeks. If you do not notice a significant change in your breathing, cough or wheezing, can return to Symbicort. Brush tongue and rinse mouth afterwards.  ?-Change Pulmicort inhaler to 2 puffs twice a day as needed for shortness of breath or wheezing  ?-Continue Ventolin inhaler 2 puffs as needed every 4 hours as needed for shortness of breath or wheezing  ?-Continue Singulair 10 mg At bedtime ? ?-Claritin 10 mg daily  ?-Flonase 2 sprays each nostril daily  ?-Mucinex 600 mg Twice daily for chest congestion ?-Delsym 2 tsp Twice daily  for cough ?-Tessalon Perles 1 capsule Three times a day as needed for cough ? ?Chest x ray today. We will notify you of any abnormal results.  ? ?Labs today - BNP, BMET, CBC with diff ?  ?Follow up in 1 month with Dr. Ander Slade. If symptoms do not improve or worsen, please contact office for sooner follow up or seek emergency care. ? ? ?

## 2021-04-04 NOTE — Assessment & Plan Note (Signed)
New oxygen requirements over the past few months. Qualified for 3 lpm POC today. Suspect multifactorial. Will assess BNP for worsening HF and CBC with diff to assess for anemia as pt is on chronic anticoagulation therapy. Goal SpO2 >88-90% ?

## 2021-04-05 ENCOUNTER — Other Ambulatory Visit: Payer: Self-pay | Admitting: Internal Medicine

## 2021-04-07 ENCOUNTER — Telehealth: Payer: Self-pay

## 2021-04-07 NOTE — Telephone Encounter (Signed)
Script was already faxed in this morning. ?

## 2021-04-07 NOTE — Telephone Encounter (Signed)
Pt requesting a refill on: ?gabapentin (NEURONTIN) 100 MG capsule ? ?Pharmacy: ?[facility] [place], [place] ? ?LOV 01/17/21 ?ROV 04/25/21 ?

## 2021-04-07 NOTE — Progress Notes (Signed)
Please notify pt labs overall unremarkable. Kidney function is declined but stable when compared to previous findings. No findings to correlate to her DOE. Thanks.

## 2021-04-08 DIAGNOSIS — J9611 Chronic respiratory failure with hypoxia: Secondary | ICD-10-CM | POA: Diagnosis not present

## 2021-04-08 DIAGNOSIS — J9612 Chronic respiratory failure with hypercapnia: Secondary | ICD-10-CM | POA: Diagnosis not present

## 2021-04-08 DIAGNOSIS — E1122 Type 2 diabetes mellitus with diabetic chronic kidney disease: Secondary | ICD-10-CM | POA: Diagnosis not present

## 2021-04-08 DIAGNOSIS — D631 Anemia in chronic kidney disease: Secondary | ICD-10-CM | POA: Diagnosis not present

## 2021-04-08 DIAGNOSIS — N1832 Chronic kidney disease, stage 3b: Secondary | ICD-10-CM | POA: Diagnosis not present

## 2021-04-08 DIAGNOSIS — I130 Hypertensive heart and chronic kidney disease with heart failure and stage 1 through stage 4 chronic kidney disease, or unspecified chronic kidney disease: Secondary | ICD-10-CM | POA: Diagnosis not present

## 2021-04-08 DIAGNOSIS — E1151 Type 2 diabetes mellitus with diabetic peripheral angiopathy without gangrene: Secondary | ICD-10-CM | POA: Diagnosis not present

## 2021-04-08 DIAGNOSIS — I87301 Chronic venous hypertension (idiopathic) without complications of right lower extremity: Secondary | ICD-10-CM | POA: Diagnosis not present

## 2021-04-08 DIAGNOSIS — I5033 Acute on chronic diastolic (congestive) heart failure: Secondary | ICD-10-CM | POA: Diagnosis not present

## 2021-04-09 ENCOUNTER — Telehealth: Payer: Self-pay | Admitting: Nurse Practitioner

## 2021-04-09 DIAGNOSIS — E1151 Type 2 diabetes mellitus with diabetic peripheral angiopathy without gangrene: Secondary | ICD-10-CM | POA: Diagnosis not present

## 2021-04-09 DIAGNOSIS — J9612 Chronic respiratory failure with hypercapnia: Secondary | ICD-10-CM | POA: Diagnosis not present

## 2021-04-09 DIAGNOSIS — E1122 Type 2 diabetes mellitus with diabetic chronic kidney disease: Secondary | ICD-10-CM | POA: Diagnosis not present

## 2021-04-09 DIAGNOSIS — I5033 Acute on chronic diastolic (congestive) heart failure: Secondary | ICD-10-CM | POA: Diagnosis not present

## 2021-04-09 DIAGNOSIS — D631 Anemia in chronic kidney disease: Secondary | ICD-10-CM | POA: Diagnosis not present

## 2021-04-09 DIAGNOSIS — I130 Hypertensive heart and chronic kidney disease with heart failure and stage 1 through stage 4 chronic kidney disease, or unspecified chronic kidney disease: Secondary | ICD-10-CM | POA: Diagnosis not present

## 2021-04-09 DIAGNOSIS — N1832 Chronic kidney disease, stage 3b: Secondary | ICD-10-CM | POA: Diagnosis not present

## 2021-04-09 DIAGNOSIS — I87301 Chronic venous hypertension (idiopathic) without complications of right lower extremity: Secondary | ICD-10-CM | POA: Diagnosis not present

## 2021-04-09 DIAGNOSIS — J9611 Chronic respiratory failure with hypoxia: Secondary | ICD-10-CM | POA: Diagnosis not present

## 2021-04-09 NOTE — Telephone Encounter (Signed)
Called patient and she would like the results of her blood work and her  chest xray. Advised patient that I would message Joellen Jersey and get those results as soon as they are available. Patient voiced understanding ? ?Katie please advise  ?

## 2021-04-10 ENCOUNTER — Other Ambulatory Visit: Payer: Self-pay

## 2021-04-10 ENCOUNTER — Encounter: Payer: Self-pay | Admitting: Sports Medicine

## 2021-04-10 ENCOUNTER — Telehealth: Payer: Self-pay

## 2021-04-10 ENCOUNTER — Ambulatory Visit (INDEPENDENT_AMBULATORY_CARE_PROVIDER_SITE_OTHER): Payer: Medicare PPO | Admitting: Sports Medicine

## 2021-04-10 ENCOUNTER — Ambulatory Visit: Payer: Medicare PPO

## 2021-04-10 DIAGNOSIS — L84 Corns and callosities: Secondary | ICD-10-CM

## 2021-04-10 DIAGNOSIS — M2141 Flat foot [pes planus] (acquired), right foot: Secondary | ICD-10-CM

## 2021-04-10 DIAGNOSIS — M79674 Pain in right toe(s): Secondary | ICD-10-CM

## 2021-04-10 DIAGNOSIS — M79675 Pain in left toe(s): Secondary | ICD-10-CM

## 2021-04-10 DIAGNOSIS — E1142 Type 2 diabetes mellitus with diabetic polyneuropathy: Secondary | ICD-10-CM

## 2021-04-10 DIAGNOSIS — M2142 Flat foot [pes planus] (acquired), left foot: Secondary | ICD-10-CM

## 2021-04-10 DIAGNOSIS — I890 Lymphedema, not elsewhere classified: Secondary | ICD-10-CM

## 2021-04-10 DIAGNOSIS — B351 Tinea unguium: Secondary | ICD-10-CM | POA: Diagnosis not present

## 2021-04-10 NOTE — Progress Notes (Signed)
Subjective: April [name] is a 72 y.o. female patient with history of diabetes who returns to office today complaining of long, painful nails; unable to trim. + blood clots and on Eliquis. FBS 109 and A1c not sure last visit to PCP [name], MD was January 2023.  Patient is also here for diabetic shoe measurement and is assisted by husband at today's visit.  Patient Active Problem List   Diagnosis Date Noted   Benign neoplasm of ascending colon    Gastritis without bleeding    Encounter for screening colonoscopy 03/19/2021   Nocturnal hypoxemia due to obesity 01/03/2021   Thrombocytopenia ([place]) 12/12/2020   Frequent nosebleeds 11/29/2020   Chronic respiratory failure with hypoxia and hypercapnia ([place]) 11/15/2020   Obesity hypoventilation syndrome ([place]) 11/15/2020   History of DVT (deep vein thrombosis) 11/15/2020   Encounter for chronic pain management 09/05/2020   Cellulitis, leg 05/08/2020   Leg DVT (deep venous thromboembolism), acute, bilateral ([place]) 05/08/2020   SOB (shortness of breath)    Gout    Morbid obesity ([place])    Pneumonia due to COVID-19 virus 12/10/2018   COVID-19 virus infection 12/06/2018   Right hip pain 11/09/2018   Osteomyelitis of ankle or foot, acute, left ([place]) 09/07/2018   Onychomycosis 05/19/2018   Idiopathic chronic venous hypertension of right lower extremity with ulcer and inflammation ([place]) 05/19/2018   Skin lumps 01/14/2018   Bilateral leg cramps 12/14/2017   Left shoulder pain 08/14/2016   Meralgia paresthetica 07/16/2016   Chronic left-sided low back pain with left-sided sciatica 06/02/2016   Osteopenia 01/11/2016   CKD (chronic kidney disease) stage 3, GFR 30-59 ml/min (HCC) 01/02/2016   Carpal tunnel syndrome 06/07/2015   Family history of colon cancer    Diabetes mellitus with neurological manifestations ([place]) 04/18/2015   Bilateral leg edema 04/11/2015   Abnormality of gait 01/03/2015   Hereditary and idiopathic peripheral  neuropathy 01/03/2015   GERD (gastroesophageal reflux disease) 06/17/2014   Hx of colonic polyps 12/15/2012   Vitamin B12 deficiency 11/03/2012   Intrinsic asthma 03/23/2012   Chronic diastolic (congestive) heart failure ([place]) 02/20/2011   CAD, NATIVE VESSEL 11/20/2008   Osteoarthritis of knees, bilateral 06/14/2008   Hyperlipidemia 05/10/2007   Essential hypertension 01/18/2007   Hypokalemia 04/30/2006   HX, PERSONAL, PEPTIC ULCER DISEASE 04/30/2006   Current Outpatient Medications on File Prior to Visit  Medication Sig Dispense Refill   acetaminophen (TYLENOL) 650 MG CR tablet Take 1,300 mg by mouth every 8 (eight) hours as needed for pain.     Alcohol Swabs (DROPSAFE ALCOHOL PREP) 70 % PADS USE TOPICALLY AS DIRECTED 300 each 3   allopurinol (ZYLOPRIM) 100 MG tablet Take 2 tablets (200 mg total) by mouth daily. (Patient taking differently: Take 200 mg by mouth daily as needed (gout).) 180 tablet 1   apixaban (ELIQUIS) 5 MG TABS tablet Take 1 tablet (5 mg total) by mouth 2 (two) times daily. 60 tablet 1   atorvastatin (LIPITOR) 40 MG tablet Take 1 tablet (40 mg total) by mouth at bedtime. 90 tablet 2   benzonatate (TESSALON) 200 MG capsule Take 1 capsule (200 mg total) by mouth 3 (three) times daily as needed for cough. 30 capsule 1   Blood Glucose Calibration (TRUE METRIX LEVEL 1) Low SOLN UAD  E11.9 1 each 3   Blood Glucose Monitoring Suppl (TRUE METRIX METER) DEVI True Metrix Level 1 solution     Budeson-Glycopyrrol-Formoterol (BREZTRI AEROSPHERE) 160-9-4.8 MCG/ACT AERO Inhale 2 puffs into the lungs  in the morning and at bedtime. 5.9 g 0   budesonide-formoterol (SYMBICORT) 160-4.5 MCG/ACT inhaler one - two inhalations every 12 hours; gargle and spit after use 1 Inhaler 4   calcium carbonate (TUMS - DOSED IN MG ELEMENTAL CALCIUM) 500 MG chewable tablet Chew 2 tablets by mouth daily as needed for indigestion or heartburn.     cholecalciferol (VITAMIN D3) 25 MCG (1000 UNIT) tablet Take 1  tablet (1,000 Units total) by mouth daily. 90 tablet 1   cyanocobalamin (,VITAMIN B-12,) 1000 MCG/ML injection INJECT 1ML INTO MUSCLE EVERY 30 DAYS 10 mL 5   dapagliflozin propanediol (FARXIGA) 10 MG TABS tablet Take 10 mg by mouth daily.     diclofenac Sodium (VOLTAREN) 1 % GEL Apply 1 application topically as needed (pain).     diphenhydrAMINE (BENADRYL) 12.5 MG/5ML liquid Take 25 mg by mouth 4 (four) times daily as needed for allergies.     eszopiclone (LUNESTA) 1 MG TABS tablet Take 1 mg by mouth at bedtime as needed for sleep. Take immediately before bedtime     famotidine (PEPCID) 20 MG tablet Take 1 tablet (20 mg total) by mouth 2 (two) times daily. 180 tablet 3   FEROSUL 325 (65 Fe) MG tablet TAKE 1 TABLET BY MOUTH EVERY MONDAY, WEDNESDAY, AND FRIDAY 60 tablet 0   fluticasone (FLONASE) 50 MCG/ACT nasal spray Place 2 sprays into both nostrils daily. 18.2 mL 2   gabapentin (NEURONTIN) 100 MG capsule TAKE 1 CAPSULE(100 MG) BY MOUTH THREE TIMES DAILY 90 capsule 1   guaiFENesin-dextromethorphan (ROBITUSSIN DM) 100-10 MG/5ML syrup Take 5 mLs by mouth every 4 (four) hours as needed for cough.     hydrOXYzine (ATARAX/VISTARIL) 25 MG tablet Take 1 tablet (25 mg total) by mouth 3 (three) times daily as needed for itching. 30 tablet 0   loratadine (CLARITIN) 10 MG tablet Take 1 tablet (10 mg total) by mouth daily. 30 tablet 5   MITIGARE 0.6 MG CAPS Take by mouth.     montelukast (SINGULAIR) 10 MG tablet TAKE 1 TABLET(10 MG) BY MOUTH AT BEDTIME 30 tablet 5   MOUNJARO 2.5 MG/0.5ML Pen ADMINISTER 0.5 ML UNDER THE SKIN 1 TIME A WEEK 2 mL 0   nebivolol (BYSTOLIC) 5 MG tablet Take 1 tablet (5 mg total) by mouth daily. 30 tablet 3   Oxcarbazepine (TRILEPTAL) 300 MG tablet Take 1 tablet (300 mg total) by mouth 2 (two) times daily. 180 tablet 1   oxyCODONE (OXY IR/ROXICODONE) 5 MG immediate release tablet Take 5 mg by mouth every 6 (six) hours as needed for severe pain.     polyethylene glycol (MIRALAX /  GLYCOLAX) 17 g packet Take 17 g by mouth daily as needed for moderate constipation.     potassium chloride SA (KLOR-CON M) 20 MEQ tablet Take 1 tablet (20 mEq total) by mouth daily. 90 tablet 1   Spacer/Aero-Holding [facility] Use with inhaler. 1 each 2   spironolactone (ALDACTONE) 25 MG tablet TAKE 1 TABLET(25 MG) BY MOUTH DAILY 90 tablet 0   torsemide (DEMADEX) 20 MG tablet Take 40 mg by mouth 2 (two) times daily.     VENTOLIN HFA 108 (90 Base) MCG/ACT inhaler Inhale 2 puffs into the lungs every 4 (four) hours as needed for wheezing.     No current facility-administered medications on file prior to visit.   Allergies  Allergen Reactions   Penicillins Rash and Other (See Comments)    She was told not to take it anymore. Has patient  had a PCN reaction causing immediate rash, facial/tongue/throat swelling, SOB or lightheadedness with hypotension: Yes Has patient had a PCN reaction causing severe rash involving mucus membranes or skin necrosis: No Has patient had a PCN reaction that required hospitalization: No Has patient had a PCN reaction occurring within the last 10 years: No If all of the above answers are "NO", then may proceed with Cephalosporin use.'   Shellfish Allergy Anaphylaxis   Sulfonamide Derivatives Anaphylaxis and Other (See Comments)   Hydrochlorothiazide W-Triamterene Other (See Comments)    Hypokalemia   Lotensin [Benazepril Hcl] Hives   Dipyridamole Other (See Comments)    Unknown reaction   Estrogens Other (See Comments)    Unknown reaction   Hydrochlorothiazide Other (See Comments)    Unknown reaction, Hypokalemia?   Latex Other (See Comments)    Unknown reaction - stated previously during surgery gloves had to be changed, but unsure if it is still an allergy or not.    Metronidazole Other (See Comments)    Unknown reaction   Other Itching    PICKLES   Spironolactone Other (See Comments)    UNSPECIFIED > "kidney problems"   Torsemide Other (See Comments)     Unknown reaction   Valsartan Other (See Comments)    Unknown reaction   Mustard [Allyl Isothiocyanate] Itching    Recent Results (from the past 2160 hour(s))  HM DIABETES EYE EXAM     Status: None   Collection Time: 01/30/21 12:00 AM  Result Value Ref Range   HM Diabetic Eye Exam No Retinopathy No Retinopathy  CBC with Differential     Status: Abnormal   Collection Time: 03/19/21  5:45 PM  Result Value Ref Range   WBC 5.1 4.0 - 10.5 K/uL   RBC 3.85 (L) 3.87 - 5.11 MIL/uL   Hemoglobin 12.0 12.0 - 15.0 g/dL   HCT 39.0 36.0 - 46.0 %   MCV 101.3 (H) 80.0 - 100.0 fL   MCH 31.2 26.0 - 34.0 pg   MCHC 30.8 30.0 - 36.0 g/dL   RDW 15.9 (H) 11.5 - 15.5 %   Platelets 173 150 - 400 K/uL   nRBC 1.0 (H) 0.0 - 0.2 %   Neutrophils Relative % 62 %   Neutro Abs 3.2 1.7 - 7.7 K/uL   Lymphocytes Relative 26 %   Lymphs Abs 1.3 0.7 - 4.0 K/uL   Monocytes Relative 8 %   Monocytes Absolute 0.4 0.1 - 1.0 K/uL   Eosinophils Relative 2 %   Eosinophils Absolute 0.1 0.0 - 0.5 K/uL   Basophils Relative 1 %   Basophils Absolute 0.1 0.0 - 0.1 K/uL   Immature Granulocytes 1 %   Abs Immature Granulocytes 0.03 0.00 - 0.07 K/uL    Comment: Performed at [facility], [place] [address] [place], [place] [address]  Comprehensive metabolic panel Once     Status: Abnormal   Collection Time: 03/19/21  5:45 PM  Result Value Ref Range   Sodium 135 135 - 145 mmol/L   Potassium 3.8 3.5 - 5.1 mmol/L   Chloride 93 (L) 98 - 111 mmol/L   CO2 34 (H) 22 - 32 mmol/L   Glucose, Bld 86 70 - 99 mg/dL    Comment: Glucose reference range applies only to samples taken after fasting for at least 8 hours.   BUN 16 8 - 23 mg/dL   Creatinine, Ser 1.56 (H) 0.44 - 1.00 mg/dL   Calcium 8.9 8.9 - 10.3 mg/dL   Total  Protein 7.2 6.5 - 8.1 g/dL   Albumin 3.9 3.5 - 5.0 g/dL   AST 18 15 - 41 U/L   ALT 17 0 - 44 U/L   Alkaline Phosphatase 89 38 - 126 U/L   Total Bilirubin 0.6 0.3 - 1.2 mg/dL   GFR, Estimated 36 (L)  >60 mL/min    Comment: (NOTE) Calculated using the CKD-EPI Creatinine Equation (2021)    Anion gap 8 5 - 15    Comment: Performed at [facility], [place] [address], [place], [place] [address]  Resp Panel by RT-PCR (Flu A&B, Covid) Nasopharyngeal Swab     Status: None   Collection Time: 03/19/21  6:12 PM   Specimen: Nasopharyngeal Swab; Nasopharyngeal(NP) swabs in vial transport medium  Result Value Ref Range   SARS Coronavirus 2 by RT PCR NEGATIVE NEGATIVE    Comment: (NOTE) SARS-CoV-2 target nucleic acids are NOT DETECTED.  The SARS-CoV-2 RNA is generally detectable in upper respiratory specimens during the acute phase of infection. The lowest concentration of SARS-CoV-2 viral copies this assay can detect is 138 copies/mL. A negative result does not preclude SARS-Cov-2 infection and should not be used as the sole basis for treatment or other patient management decisions. A negative result may occur with  improper specimen collection/handling, submission of specimen other than nasopharyngeal swab, presence of viral mutation(s) within the areas targeted by this assay, and inadequate number of viral copies(<138 copies/mL). A negative result must be combined with clinical observations, patient history, and epidemiological information. The expected result is Negative.  Fact Sheet for Patients:  EntrepreneurPulse.com.au  Fact Sheet for Healthcare Providers:  IncredibleEmployment.be  This test is no t yet approved or cleared by the [place] FDA and  has been authorized for detection and/or diagnosis of SARS-CoV-2 by FDA under an Emergency Use Authorization (EUA). This EUA will remain  in effect (meaning this test can be used) for the duration of the COVID-19 declaration under Section 564(b)(1) of the Act, 21 U.S.C.section 360bbb-3(b)(1), unless the authorization is terminated  or revoked sooner.       Influenza A by PCR  NEGATIVE NEGATIVE   Influenza B by PCR NEGATIVE NEGATIVE    Comment: (NOTE) The Xpert Xpress SARS-CoV-2/FLU/RSV plus assay is intended as an aid in the diagnosis of influenza from Nasopharyngeal swab specimens and should not be used as a sole basis for treatment. Nasal washings and aspirates are unacceptable for Xpert Xpress SARS-CoV-2/FLU/RSV testing.  Fact Sheet for Patients: EntrepreneurPulse.com.au  Fact Sheet for Healthcare Providers: IncredibleEmployment.be  This test is not yet approved or cleared by the [place] FDA and has been authorized for detection and/or diagnosis of SARS-CoV-2 by FDA under an Emergency Use Authorization (EUA). This EUA will remain in effect (meaning this test can be used) for the duration of the COVID-19 declaration under Section 564(b)(1) of the Act, 21 U.S.C. section 360bbb-3(b)(1), unless the authorization is terminated or revoked.  Performed at [facility], [place] [address], [place], [place] [address]   Glucose, capillary     Status: None   Collection Time: 03/20/21  7:25 AM  Result Value Ref Range   Glucose-Capillary 82 70 - 99 mg/dL    Comment: Glucose reference range applies only to samples taken after fasting for at least 8 hours.  Surgical pathology     Status: None   Collection Time: 03/20/21  7:53 AM  Result Value Ref Range   SURGICAL PATHOLOGY      SURGICAL PATHOLOGY CASE:  [id number] PATIENT: Tashia Graul Surgical Pathology Report     Clinical History: Family history colon cancer; GERD; H pylori ([facility])     FINAL MICROSCOPIC DIAGNOSIS:  A. STOMACH, BODY AND ANTRUM, BIOPSY: - Gastric antral mucosa with mild nonspecific reactive gastropathy - Gastric oxyntic mucosa with no specific histopathologic changes - Helicobacter pylori-like organisms are not identified on routine HE stain  B. COLON, ASCENDING, POLYPECTOMY: - Diminutive inflammatory polyp - Negative for  dysplasia - Multiple step sections were examined      GROSS DESCRIPTION:  Specimen A: Received in formalin are tan, soft tissue fragments that are submitted in toto. Number: 4.  Size: 0.1 to 0.4 cm.  Blocks: 1  Specimen B: Received in formalin is a 0.2 cm tan-pink polyp, in toto 1 block.  SW 03/20/2021   Final Diagnosis performed by Jaquita Folds, MD.   Electronically signed 03/25/2021 Technical and / or Professional components perf ormed at [facility], [place] [address], [place], [place] [address].  Immunohistochemistry Technical component (if applicable) was performed at [facility]. [address], [place], [place], [place] [address].   IMMUNOHISTOCHEMISTRY DISCLAIMER (if applicable): Some of these immunohistochemical stains may have been developed and the performance characteristics determine by [facility]. Some may not have been cleared or approved by the U.S. Food and Drug Administration. The FDA has determined that such clearance or approval is not necessary. This test is used for clinical purposes. It should not be regarded as investigational or for research. This laboratory is certified under the [place] (CLIA-88) as qualified to perform high complexity clinical laboratory testing.  The controls stained appropriately.   CBC w/Diff     Status: Abnormal   Collection Time: 04/04/21  3:10 PM  Result Value Ref Range   WBC 5.7 4.0 - 10.5 K/uL   RBC 3.93 3.87 - 5.11 Mil/uL   Hemoglobin 12.5 12.0 - 15.0 g/dL   HCT 38.9 36.0 - 46.0 %   MCV 99.0 78.0 - 100.0 fl   MCHC 32.2 30.0 - 36.0 g/dL   RDW 16.8 (H) 11.5 - 15.5 %   Platelets 199.0 150.0 - 400.0 K/uL   Neutrophils Relative % 68.9 43.0 - 77.0 %   Lymphocytes Relative 21.6 12.0 - 46.0 %   Monocytes Relative 6.6 3.0 - 12.0 %   Eosinophils Relative 1.4 0.0 - 5.0 %   Basophils Relative 1.5 0.0 - 3.0 %   Neutro Abs 4.0 1.4 -  7.7 K/uL   Lymphs Abs 1.2 0.7 - 4.0 K/uL   Monocytes Absolute 0.4 0.1 - 1.0 K/uL   Eosinophils Absolute 0.1 0.0 - 0.7 K/uL   Basophils Absolute 0.1 0.0 - 0.1 K/uL  Basic Metabolic Panel (BMET)     Status: Abnormal   Collection Time: 04/04/21  3:10 PM  Result Value Ref Range   Sodium 139 135 - 145 mEq/L   Potassium 3.6 3.5 - 5.1 mEq/L   Chloride 96 96 - 112 mEq/L   CO2 37 (H) 19 - 32 mEq/L   Glucose, Bld 116 (H) 70 - 99 mg/dL   BUN 15 6 - 23 mg/dL   Creatinine, Ser 1.45 (H) 0.40 - 1.20 mg/dL   GFR 36.46 (L) >60.00 mL/min    Comment: Calculated using the CKD-EPI Creatinine Equation (2021)   Calcium 9.6 8.4 - 10.5 mg/dL  B Nat Peptide     Status: None   Collection Time: 04/04/21  3:10 PM  Result Value Ref  Range   Pro B Natriuretic peptide (BNP) 40.0 0.0 - 100.0 pg/mL    Objective: General: Patient is awake, alert, and oriented x 3 and in no acute distress.  Integument: Skin is warm, dry and supple bilateral. Nails are tender, long, thickened and dystrophic with subungual debris, consistent with onychomycosis, 1-5 bilateral.  Minimal dry skin bilateral heels. Trophic skin changes and scar noted on the right leg patient is currently being followed by Dr. [name].  Vasculature:  Dorsalis Pedis pulse 0/4 bilateral. Posterior Tibial pulse  0/4 bilateral. Capillary fill time <5 sec 1-5 bilateral. No hair growth to the level of the digits.Temperature gradient within normal limits. No varicosities present bilateral. Chronic edema present bilateral.   Neuro: Gross sensation present via light touch however protective severely diminished with Semmes Weinstein monofilament bilateral as previously noted.   Musculoskeletal: Asymptomatic pes planus pedal deformities noted bilateral. Muscular strength 4/5 in all lower extremity muscular groups in wheelchair.   Assessment and Plan: Problem List Items Addressed This Visit   None Visit Diagnoses     Pain due to onychomycosis of toenails of both feet     -  Primary   Diabetic polyneuropathy associated with type 2 diabetes mellitus (HCC)       Lymphedema       Pes planus of both feet       Callus of heel           -Examined patient. -Re-Discussed and educated patient on diabetic foot care, especially with  regards to the vascular, neurological and musculoskeletal systems.  -Mechanically debrided all painful nails 1-5 bilateral using sterile nail nipper and filed with dremel without incident  -Continue with CircAid compression for edema control -Continue with daily skin emollients for dry skin to avoid heel callusing -Patient to be measured today for diabetic shoes and insoles for the year -Answered all patient questions -Patient to return  in 3 months for at risk foot care -Patient advised to call the office if any problems or questions arise in the meantime.  Landis Martins, DPM

## 2021-04-10 NOTE — Telephone Encounter (Signed)
Casts Sent to [employer] - HOLD FOR CMN ?

## 2021-04-10 NOTE — Progress Notes (Signed)
SITUATION ?Reason for Consult: Evaluation for Prefabricated Diabetic Shoes and Custom Diabetic Inserts. ?Patient / Caregiver Report: Patient would like well fitting shoes ? ?OBJECTIVE DATA: ?Patient History / Diagnosis:  ?  ICD-10-CM   ?1. Diabetic polyneuropathy associated with type 2 diabetes mellitus ([place])  E11.42   ?  ?2. Pes planus of both feet  M21.41   ? M21.42   ?  ?3. Callus of heel  L84   ?  ? ? ?Current or Previous Devices:   Current user ? ?In-Person Foot Examination: ?Ulcers & Callousing:   Heel callusing ? ?Deformities:   ?- Flat foot [pes planus] ? ?  ?Shoe Size: 12XW ? ?ORTHOTIC RECOMMENDATION ?Recommended Devices: ?- 1x pair prefabricated PDAC approved diabetic shoes; Patient Selected - [name] Blue 844 Size 12XW ?- 3x pair custom-to-patient PDAC approved vacuum formed diabetic insoles. ? ?GOALS OF SHOES AND INSOLES ?- Reduce shear and pressure ?- Reduce / Prevent callus formation ?- Reduce / Prevent ulceration ?- Protect the fragile healing compromised diabetic foot. ? ?Patient would benefit from diabetic shoes and inserts as patient has diabetes mellitus and the patient has one or more of the following conditions: ?- History of pre-ulcerative callus ?- Peripheral neuropathy with evidence of callus formation ?- Foot deformity ?- Poor circulation ? ?ACTIONS PERFORMED ?Patient was casted for insoles via crush box and measured for shoes via brannock device. Procedure was explained and patient tolerated procedure well. All questions were answered and concerns addressed. ? ?PLAN ?Patient is to be contacted and scheduled for fitting once CMN is obtained from treaing physician and shoes and insoles have been fabricated and received. ? ?

## 2021-04-11 DIAGNOSIS — J9611 Chronic respiratory failure with hypoxia: Secondary | ICD-10-CM | POA: Diagnosis not present

## 2021-04-11 DIAGNOSIS — E1151 Type 2 diabetes mellitus with diabetic peripheral angiopathy without gangrene: Secondary | ICD-10-CM | POA: Diagnosis not present

## 2021-04-11 DIAGNOSIS — D631 Anemia in chronic kidney disease: Secondary | ICD-10-CM | POA: Diagnosis not present

## 2021-04-11 DIAGNOSIS — I87301 Chronic venous hypertension (idiopathic) without complications of right lower extremity: Secondary | ICD-10-CM | POA: Diagnosis not present

## 2021-04-11 DIAGNOSIS — I130 Hypertensive heart and chronic kidney disease with heart failure and stage 1 through stage 4 chronic kidney disease, or unspecified chronic kidney disease: Secondary | ICD-10-CM | POA: Diagnosis not present

## 2021-04-11 DIAGNOSIS — E1122 Type 2 diabetes mellitus with diabetic chronic kidney disease: Secondary | ICD-10-CM | POA: Diagnosis not present

## 2021-04-11 DIAGNOSIS — M1712 Unilateral primary osteoarthritis, left knee: Secondary | ICD-10-CM | POA: Diagnosis not present

## 2021-04-11 DIAGNOSIS — I5033 Acute on chronic diastolic (congestive) heart failure: Secondary | ICD-10-CM | POA: Diagnosis not present

## 2021-04-11 DIAGNOSIS — J9612 Chronic respiratory failure with hypercapnia: Secondary | ICD-10-CM | POA: Diagnosis not present

## 2021-04-11 DIAGNOSIS — N1832 Chronic kidney disease, stage 3b: Secondary | ICD-10-CM | POA: Diagnosis not present

## 2021-04-14 DIAGNOSIS — D631 Anemia in chronic kidney disease: Secondary | ICD-10-CM | POA: Diagnosis not present

## 2021-04-14 DIAGNOSIS — N1832 Chronic kidney disease, stage 3b: Secondary | ICD-10-CM | POA: Diagnosis not present

## 2021-04-14 DIAGNOSIS — E1122 Type 2 diabetes mellitus with diabetic chronic kidney disease: Secondary | ICD-10-CM | POA: Diagnosis not present

## 2021-04-14 DIAGNOSIS — I87301 Chronic venous hypertension (idiopathic) without complications of right lower extremity: Secondary | ICD-10-CM | POA: Diagnosis not present

## 2021-04-14 DIAGNOSIS — E1151 Type 2 diabetes mellitus with diabetic peripheral angiopathy without gangrene: Secondary | ICD-10-CM | POA: Diagnosis not present

## 2021-04-14 DIAGNOSIS — I130 Hypertensive heart and chronic kidney disease with heart failure and stage 1 through stage 4 chronic kidney disease, or unspecified chronic kidney disease: Secondary | ICD-10-CM | POA: Diagnosis not present

## 2021-04-14 DIAGNOSIS — J9611 Chronic respiratory failure with hypoxia: Secondary | ICD-10-CM | POA: Diagnosis not present

## 2021-04-14 DIAGNOSIS — I5033 Acute on chronic diastolic (congestive) heart failure: Secondary | ICD-10-CM | POA: Diagnosis not present

## 2021-04-14 DIAGNOSIS — J9612 Chronic respiratory failure with hypercapnia: Secondary | ICD-10-CM | POA: Diagnosis not present

## 2021-04-14 NOTE — Telephone Encounter (Signed)
Note from 3/6 - Please notify pt labs overall unremarkable. Kidney function is declined but stable when compared to previous findings. No findings to correlate to her DOE. Thanks. Her CXR was clear and without any concerning findings. Thanks!

## 2021-04-15 NOTE — Telephone Encounter (Signed)
Called patient and notified her of her lab work results and her chest xray. Patient voiced understanding with no questions noted. Nothing further  ?

## 2021-04-16 DIAGNOSIS — J9611 Chronic respiratory failure with hypoxia: Secondary | ICD-10-CM | POA: Diagnosis not present

## 2021-04-16 DIAGNOSIS — N1832 Chronic kidney disease, stage 3b: Secondary | ICD-10-CM | POA: Diagnosis not present

## 2021-04-16 DIAGNOSIS — E1151 Type 2 diabetes mellitus with diabetic peripheral angiopathy without gangrene: Secondary | ICD-10-CM | POA: Diagnosis not present

## 2021-04-16 DIAGNOSIS — J9612 Chronic respiratory failure with hypercapnia: Secondary | ICD-10-CM | POA: Diagnosis not present

## 2021-04-16 DIAGNOSIS — I87301 Chronic venous hypertension (idiopathic) without complications of right lower extremity: Secondary | ICD-10-CM | POA: Diagnosis not present

## 2021-04-16 DIAGNOSIS — I130 Hypertensive heart and chronic kidney disease with heart failure and stage 1 through stage 4 chronic kidney disease, or unspecified chronic kidney disease: Secondary | ICD-10-CM | POA: Diagnosis not present

## 2021-04-16 DIAGNOSIS — I5033 Acute on chronic diastolic (congestive) heart failure: Secondary | ICD-10-CM | POA: Diagnosis not present

## 2021-04-16 DIAGNOSIS — E1122 Type 2 diabetes mellitus with diabetic chronic kidney disease: Secondary | ICD-10-CM | POA: Diagnosis not present

## 2021-04-16 DIAGNOSIS — D631 Anemia in chronic kidney disease: Secondary | ICD-10-CM | POA: Diagnosis not present

## 2021-04-18 DIAGNOSIS — J9611 Chronic respiratory failure with hypoxia: Secondary | ICD-10-CM | POA: Diagnosis not present

## 2021-04-18 DIAGNOSIS — I130 Hypertensive heart and chronic kidney disease with heart failure and stage 1 through stage 4 chronic kidney disease, or unspecified chronic kidney disease: Secondary | ICD-10-CM | POA: Diagnosis not present

## 2021-04-18 DIAGNOSIS — N1832 Chronic kidney disease, stage 3b: Secondary | ICD-10-CM | POA: Diagnosis not present

## 2021-04-18 DIAGNOSIS — E1151 Type 2 diabetes mellitus with diabetic peripheral angiopathy without gangrene: Secondary | ICD-10-CM | POA: Diagnosis not present

## 2021-04-18 DIAGNOSIS — M1711 Unilateral primary osteoarthritis, right knee: Secondary | ICD-10-CM | POA: Diagnosis not present

## 2021-04-18 DIAGNOSIS — I5033 Acute on chronic diastolic (congestive) heart failure: Secondary | ICD-10-CM | POA: Diagnosis not present

## 2021-04-18 DIAGNOSIS — M1712 Unilateral primary osteoarthritis, left knee: Secondary | ICD-10-CM | POA: Diagnosis not present

## 2021-04-18 DIAGNOSIS — D631 Anemia in chronic kidney disease: Secondary | ICD-10-CM | POA: Diagnosis not present

## 2021-04-18 DIAGNOSIS — E1122 Type 2 diabetes mellitus with diabetic chronic kidney disease: Secondary | ICD-10-CM | POA: Diagnosis not present

## 2021-04-18 DIAGNOSIS — I87301 Chronic venous hypertension (idiopathic) without complications of right lower extremity: Secondary | ICD-10-CM | POA: Diagnosis not present

## 2021-04-18 DIAGNOSIS — J9612 Chronic respiratory failure with hypercapnia: Secondary | ICD-10-CM | POA: Diagnosis not present

## 2021-04-22 DIAGNOSIS — N1832 Chronic kidney disease, stage 3b: Secondary | ICD-10-CM | POA: Diagnosis not present

## 2021-04-22 DIAGNOSIS — D631 Anemia in chronic kidney disease: Secondary | ICD-10-CM | POA: Diagnosis not present

## 2021-04-22 DIAGNOSIS — E1122 Type 2 diabetes mellitus with diabetic chronic kidney disease: Secondary | ICD-10-CM | POA: Diagnosis not present

## 2021-04-22 DIAGNOSIS — I5033 Acute on chronic diastolic (congestive) heart failure: Secondary | ICD-10-CM | POA: Diagnosis not present

## 2021-04-22 DIAGNOSIS — I130 Hypertensive heart and chronic kidney disease with heart failure and stage 1 through stage 4 chronic kidney disease, or unspecified chronic kidney disease: Secondary | ICD-10-CM | POA: Diagnosis not present

## 2021-04-22 DIAGNOSIS — E1151 Type 2 diabetes mellitus with diabetic peripheral angiopathy without gangrene: Secondary | ICD-10-CM | POA: Diagnosis not present

## 2021-04-22 DIAGNOSIS — I87301 Chronic venous hypertension (idiopathic) without complications of right lower extremity: Secondary | ICD-10-CM | POA: Diagnosis not present

## 2021-04-22 DIAGNOSIS — J9611 Chronic respiratory failure with hypoxia: Secondary | ICD-10-CM | POA: Diagnosis not present

## 2021-04-22 DIAGNOSIS — J9612 Chronic respiratory failure with hypercapnia: Secondary | ICD-10-CM | POA: Diagnosis not present

## 2021-04-23 DIAGNOSIS — I130 Hypertensive heart and chronic kidney disease with heart failure and stage 1 through stage 4 chronic kidney disease, or unspecified chronic kidney disease: Secondary | ICD-10-CM | POA: Diagnosis not present

## 2021-04-23 DIAGNOSIS — E1122 Type 2 diabetes mellitus with diabetic chronic kidney disease: Secondary | ICD-10-CM | POA: Diagnosis not present

## 2021-04-23 DIAGNOSIS — I5033 Acute on chronic diastolic (congestive) heart failure: Secondary | ICD-10-CM | POA: Diagnosis not present

## 2021-04-23 DIAGNOSIS — J9611 Chronic respiratory failure with hypoxia: Secondary | ICD-10-CM | POA: Diagnosis not present

## 2021-04-23 DIAGNOSIS — E1151 Type 2 diabetes mellitus with diabetic peripheral angiopathy without gangrene: Secondary | ICD-10-CM | POA: Diagnosis not present

## 2021-04-23 DIAGNOSIS — N1832 Chronic kidney disease, stage 3b: Secondary | ICD-10-CM | POA: Diagnosis not present

## 2021-04-23 DIAGNOSIS — D631 Anemia in chronic kidney disease: Secondary | ICD-10-CM | POA: Diagnosis not present

## 2021-04-23 DIAGNOSIS — J9612 Chronic respiratory failure with hypercapnia: Secondary | ICD-10-CM | POA: Diagnosis not present

## 2021-04-23 DIAGNOSIS — I87301 Chronic venous hypertension (idiopathic) without complications of right lower extremity: Secondary | ICD-10-CM | POA: Diagnosis not present

## 2021-04-24 ENCOUNTER — Encounter: Payer: Self-pay | Admitting: Internal Medicine

## 2021-04-24 NOTE — Progress Notes (Signed)
? ? ? ? ?Subjective:  ? ? Patient ID: April [name], female    DOB: 1950/02/27, 71 y.o.   MRN: [id number] ? ?This visit occurred during the SARS-CoV-2 public health emergency.  Safety protocols were in place, including screening questions prior to the visit, additional usage of staff PPE, and extensive cleaning of exam room while observing appropriate contact time as indicated for disinfecting solutions.   ? ? ?HPI ?April [name] is here for follow up of her chronic medical problems, including htn, b/l LE DVT on eliquis, HFp EF, DM, GERD, chronic knee pain, neuropathy, h/o gout, CKD, hld, B12 def, leg edema.  ? ? ? Mounjaro - doing well with it. ? ?Hyperpigmented skin in legs - wants to try otc cream to help lighten ? ? ?Had a few bites of a hamburger two days ago - it did not taste right.  Has had diarrhea and abd pain since then.  Has belching, nausea,, gerd, abdominal cramping, diarrhea.   No blood in stool.  She tried pepto bismol. No appetite ? ? ?Medications and allergies reviewed with patient and updated if appropriate. ? ?Current Outpatient Medications on File Prior to Visit  ?Medication Sig Dispense Refill  ? acetaminophen (TYLENOL) 650 MG CR tablet Take 1,300 mg by mouth every 8 (eight) hours as needed for pain.    ? Alcohol Swabs (DROPSAFE ALCOHOL PREP) 70 % PADS USE TOPICALLY AS DIRECTED 300 each 3  ? allopurinol (ZYLOPRIM) 100 MG tablet Take 2 tablets (200 mg total) by mouth daily. (Patient taking differently: Take 200 mg by mouth daily as needed (gout).) 180 tablet 1  ? apixaban (ELIQUIS) 5 MG TABS tablet Take 1 tablet (5 mg total) by mouth 2 (two) times daily. 60 tablet 1  ? atorvastatin (LIPITOR) 40 MG tablet Take 1 tablet (40 mg total) by mouth at bedtime. 90 tablet 2  ? Blood Glucose Calibration (TRUE METRIX LEVEL 1) Low SOLN UAD  E11.9 1 each 3  ? Blood Glucose Monitoring Suppl (TRUE METRIX METER) DEVI True Metrix Level 1 solution    ? Budeson-Glycopyrrol-Formoterol (BREZTRI AEROSPHERE) 160-9-4.8  MCG/ACT AERO Inhale 2 puffs into the lungs in the morning and at bedtime. 5.9 g 0  ? budesonide-formoterol (SYMBICORT) 160-4.5 MCG/ACT inhaler one - two inhalations every 12 hours; gargle and spit after use 1 Inhaler 4  ? calcium carbonate (TUMS - DOSED IN MG ELEMENTAL CALCIUM) 500 MG chewable tablet Chew 2 tablets by mouth daily as needed for indigestion or heartburn.    ? cholecalciferol (VITAMIN D3) 25 MCG (1000 UNIT) tablet Take 1 tablet (1,000 Units total) by mouth daily. 90 tablet 1  ? cyanocobalamin (,VITAMIN B-12,) 1000 MCG/ML injection INJECT 1ML INTO MUSCLE EVERY 30 DAYS 10 mL 5  ? dapagliflozin propanediol (FARXIGA) 10 MG TABS tablet Take 10 mg by mouth daily.    ? diclofenac Sodium (VOLTAREN) 1 % GEL Apply 1 application topically as needed (pain).    ? diphenhydrAMINE (BENADRYL) 12.5 MG/5ML liquid Take 25 mg by mouth 4 (four) times daily as needed for allergies.    ? eszopiclone (LUNESTA) 1 MG TABS tablet Take 1 mg by mouth at bedtime as needed for sleep. Take immediately before bedtime    ? famotidine (PEPCID) 20 MG tablet Take 1 tablet (20 mg total) by mouth 2 (two) times daily. 180 tablet 3  ? FEROSUL 325 (65 Fe) MG tablet TAKE 1 TABLET BY MOUTH EVERY MONDAY, WEDNESDAY, AND FRIDAY 60 tablet 0  ? fluticasone (FLONASE) 50 MCG/ACT nasal  spray Place 2 sprays into both nostrils daily. 18.2 mL 2  ? gabapentin (NEURONTIN) 100 MG capsule TAKE 1 CAPSULE(100 MG) BY MOUTH THREE TIMES DAILY 90 capsule 1  ? guaiFENesin-dextromethorphan (ROBITUSSIN DM) 100-10 MG/5ML syrup Take 5 mLs by mouth every 4 (four) hours as needed for cough.    ? hydrOXYzine (ATARAX/VISTARIL) 25 MG tablet Take 1 tablet (25 mg total) by mouth 3 (three) times daily as needed for itching. 30 tablet 0  ? loratadine (CLARITIN) 10 MG tablet Take 1 tablet (10 mg total) by mouth daily. 30 tablet 5  ? MITIGARE 0.6 MG CAPS Take by mouth.    ? montelukast (SINGULAIR) 10 MG tablet TAKE 1 TABLET(10 MG) BY MOUTH AT BEDTIME 30 tablet 5  ? nebivolol  (BYSTOLIC) 5 MG tablet Take 1 tablet (5 mg total) by mouth daily. 30 tablet 3  ? Oxcarbazepine (TRILEPTAL) 300 MG tablet Take 1 tablet (300 mg total) by mouth 2 (two) times daily. 180 tablet 1  ? polyethylene glycol (MIRALAX / GLYCOLAX) 17 g packet Take 17 g by mouth daily as needed for moderate constipation.    ? potassium chloride SA (KLOR-CON M) 20 MEQ tablet Take 1 tablet (20 mEq total) by mouth daily. 90 tablet 1  ? Spacer/Aero-Holding Dorise Bullion Use with inhaler. 1 each 2  ? spironolactone (ALDACTONE) 25 MG tablet TAKE 1 TABLET(25 MG) BY MOUTH DAILY 90 tablet 0  ? torsemide (DEMADEX) 20 MG tablet Take 40 mg by mouth 2 (two) times daily.    ? VENTOLIN HFA 108 (90 Base) MCG/ACT inhaler Inhale 2 puffs into the lungs every 4 (four) hours as needed for wheezing.    ? ?No current facility-administered medications on file prior to visit.  ? ? ? ?Review of Systems  ?Constitutional:  Positive for fever. Negative for chills.  ?Respiratory:  Positive for cough, shortness of breath and wheezing.   ?Cardiovascular:  Positive for leg swelling. Negative for chest pain and palpitations.  ?Gastrointestinal:  Positive for abdominal pain, diarrhea and nausea. Negative for blood in stool.  ?     Jerrye Bushy  ?Neurological:  Negative for light-headedness and headaches.  ? ?   ?Objective:  ? ?Vitals:  ? 04/25/21 1258  ?BP: 126/76  ?Pulse: 98  ?Temp: 99 ?F (37.2 ?C)  ?SpO2: 95%  ? ?BP Readings from Last 3 Encounters:  ?04/25/21 126/76  ?04/04/21 130/76  ?03/20/21 (!) 116/57  ? ?Wt Readings from Last 3 Encounters:  ?04/04/21 (!) 348 lb (157.9 kg)  ?01/14/21 (!) 362 lb (164.2 kg)  ?01/10/21 (!) 362 lb (164.2 kg)  ? ?Body mass index is 63.65 kg/m?. ? ?  ?Physical Exam ?Constitutional:   ?   General: She is not in acute distress. ?   Appearance: Normal appearance.  ?HENT:  ?   Head: Normocephalic and atraumatic.  ?Eyes:  ?   Conjunctiva/sclera: Conjunctivae normal.  ?Cardiovascular:  ?   Rate and Rhythm: Normal rate and regular rhythm.  ?    Heart sounds: Normal heart sounds. No murmur heard. ?Pulmonary:  ?   Effort: Pulmonary effort is normal. No respiratory distress.  ?   Breath sounds: Normal breath sounds. No wheezing.  ?Abdominal:  ?   General: There is no distension.  ?   Palpations: Abdomen is soft.  ?   Tenderness: There is abdominal tenderness (lower abd).  ?   Comments: obese  ?Musculoskeletal:  ?   Cervical back: Neck supple.  ?   Right lower leg: No edema.  ?  Left lower leg: No edema.  ?Lymphadenopathy:  ?   Cervical: No cervical adenopathy.  ?Skin: ?   Findings: No rash.  ?Neurological:  ?   Mental Status: She is alert. Mental status is at baseline.  ?Psychiatric:     ?   Mood and Affect: Mood normal.     ?   Behavior: Behavior normal.  ? ?   ? ?Lab Results  ?Component Value Date  ? WBC 5.7 04/04/2021  ? HGB 12.5 04/04/2021  ? HCT 38.9 04/04/2021  ? PLT 199.0 04/04/2021  ? GLUCOSE 116 (H) 04/04/2021  ? CHOL 166 11/29/2020  ? TRIG 102.0 11/29/2020  ? HDL 33.80 (L) 11/29/2020  ? LDLDIRECT 169.4 05/02/2007  ? [place] 111 (H) 11/29/2020  ? ALT 17 03/19/2021  ? AST 18 03/19/2021  ? NA 139 04/04/2021  ? K 3.6 04/04/2021  ? CL 96 04/04/2021  ? CREATININE 1.45 (H) 04/04/2021  ? BUN 15 04/04/2021  ? CO2 37 (H) 04/04/2021  ? TSH 4.317 11/15/2020  ? INR 1.0 07/04/2006  ? HGBA1C 6.4 11/29/2020  ? MICROALBUR 1.0 04/10/2015  ? ? ? ?Assessment & Plan:  ? ? ?See Problem List for Assessment and Plan of chronic medical problems.  ? ? ?

## 2021-04-24 NOTE — Patient Instructions (Addendum)
? ? ? ?  Medications changes include :   increase mounjaro to 5 mg weekly, zofran - antinausea medication, cough syrup, azithromycin x 3 days.  ? ? ?Your prescription(s) have been sent to your pharmacy.  ? ?.  ? ? ?Return in about 4 months (around 08/25/2021) for follow up. ? ?

## 2021-04-25 ENCOUNTER — Other Ambulatory Visit: Payer: Self-pay

## 2021-04-25 ENCOUNTER — Ambulatory Visit (INDEPENDENT_AMBULATORY_CARE_PROVIDER_SITE_OTHER): Payer: Medicare PPO | Admitting: Internal Medicine

## 2021-04-25 VITALS — BP 126/76 | HR 98 | Temp 99.0°F | Ht 62.0 in

## 2021-04-25 DIAGNOSIS — G609 Hereditary and idiopathic neuropathy, unspecified: Secondary | ICD-10-CM

## 2021-04-25 DIAGNOSIS — R600 Localized edema: Secondary | ICD-10-CM

## 2021-04-25 DIAGNOSIS — D631 Anemia in chronic kidney disease: Secondary | ICD-10-CM | POA: Diagnosis not present

## 2021-04-25 DIAGNOSIS — I10 Essential (primary) hypertension: Secondary | ICD-10-CM

## 2021-04-25 DIAGNOSIS — E1122 Type 2 diabetes mellitus with diabetic chronic kidney disease: Secondary | ICD-10-CM | POA: Diagnosis not present

## 2021-04-25 DIAGNOSIS — K219 Gastro-esophageal reflux disease without esophagitis: Secondary | ICD-10-CM

## 2021-04-25 DIAGNOSIS — E538 Deficiency of other specified B group vitamins: Secondary | ICD-10-CM | POA: Diagnosis not present

## 2021-04-25 DIAGNOSIS — J9612 Chronic respiratory failure with hypercapnia: Secondary | ICD-10-CM | POA: Diagnosis not present

## 2021-04-25 DIAGNOSIS — K529 Noninfective gastroenteritis and colitis, unspecified: Secondary | ICD-10-CM

## 2021-04-25 DIAGNOSIS — M170 Bilateral primary osteoarthritis of knee: Secondary | ICD-10-CM

## 2021-04-25 DIAGNOSIS — E782 Mixed hyperlipidemia: Secondary | ICD-10-CM

## 2021-04-25 DIAGNOSIS — I87301 Chronic venous hypertension (idiopathic) without complications of right lower extremity: Secondary | ICD-10-CM | POA: Diagnosis not present

## 2021-04-25 DIAGNOSIS — N1832 Chronic kidney disease, stage 3b: Secondary | ICD-10-CM

## 2021-04-25 DIAGNOSIS — I130 Hypertensive heart and chronic kidney disease with heart failure and stage 1 through stage 4 chronic kidney disease, or unspecified chronic kidney disease: Secondary | ICD-10-CM | POA: Diagnosis not present

## 2021-04-25 DIAGNOSIS — E1151 Type 2 diabetes mellitus with diabetic peripheral angiopathy without gangrene: Secondary | ICD-10-CM | POA: Diagnosis not present

## 2021-04-25 DIAGNOSIS — I5032 Chronic diastolic (congestive) heart failure: Secondary | ICD-10-CM

## 2021-04-25 DIAGNOSIS — E1140 Type 2 diabetes mellitus with diabetic neuropathy, unspecified: Secondary | ICD-10-CM | POA: Diagnosis not present

## 2021-04-25 DIAGNOSIS — J9611 Chronic respiratory failure with hypoxia: Secondary | ICD-10-CM | POA: Diagnosis not present

## 2021-04-25 DIAGNOSIS — I5033 Acute on chronic diastolic (congestive) heart failure: Secondary | ICD-10-CM | POA: Diagnosis not present

## 2021-04-25 MED ORDER — AZITHROMYCIN 500 MG PO TABS: 500.0000 mg | ORAL_TABLET | Freq: Every day | ORAL | 0 refills | Status: AC

## 2021-04-25 MED ORDER — OXYCODONE HCL 5 MG PO TABS: 5.0000 mg | ORAL_TABLET | Freq: Four times a day (QID) | ORAL | 0 refills | Status: DC | PRN

## 2021-04-25 MED ORDER — HYDROCODONE BIT-HOMATROP MBR 5-1.5 MG/5ML PO SOLN: 5.0000 mL | Freq: Three times a day (TID) | ORAL | 0 refills | Status: DC | PRN

## 2021-04-25 MED ORDER — TIRZEPATIDE 5 MG/0.5ML ~~LOC~~ SOAJ: 5.0000 mg | SUBCUTANEOUS | 0 refills | Status: DC

## 2021-04-25 MED ORDER — ONDANSETRON HCL 4 MG PO TABS: 4.0000 mg | ORAL_TABLET | Freq: Three times a day (TID) | ORAL | 0 refills | Status: DC | PRN

## 2021-04-25 NOTE — Assessment & Plan Note (Signed)
Chronic ?Following with Dr. [name] ?On Farxiga 10 mg daily ?

## 2021-04-25 NOTE — Assessment & Plan Note (Signed)
Acute ?Related to eating a hamburger - ? Bacterial infection  ?Start azithro 500 mg qd x 3 days ?zofran 4 mg tid prn nausea ?Bland diet ?Continue increased fluids ?Call if no improvement ?

## 2021-04-25 NOTE — Assessment & Plan Note (Signed)
Chronic GERD controlled Continue famotidine 20 mg twice daily 

## 2021-04-25 NOTE — Assessment & Plan Note (Signed)
Chronic ?Severe nature ?Following with orthopedics and gets periodic injections ?Continue oxycodone 5 mg every 6 hours for severe pain only ?

## 2021-04-25 NOTE — Assessment & Plan Note (Signed)
Chronic Continue B12 injections monthly 

## 2021-04-25 NOTE — Assessment & Plan Note (Signed)
Chronic ?Fairly controlled ?Continue spironolactone 25 mg daily and torsemide 40 mg twice daily ?

## 2021-04-25 NOTE — Assessment & Plan Note (Signed)
Chronic Blood pressure well controlled CMP Continue Bystolic 5 mg daily, spironolactone 25 mg daily, torsemide 40 mg twice daily 

## 2021-04-25 NOTE — Assessment & Plan Note (Signed)
Chronic ?Regular activity-is much as possible and healthy diet encouraged ?Continue atorvastatin 40 mg daily ?

## 2021-04-25 NOTE — Assessment & Plan Note (Signed)
Chronic ?Continue gabapentin 100 mg 3 times daily and Trileptal 300 mg twice daily ?Continue oxycodone 5 mg every 6 hours as needed, which she only takes for severe pain ?

## 2021-04-25 NOTE — Assessment & Plan Note (Signed)
Chronic ?Lab Results  ?Component Value Date  ? HGBA1C 6.4 11/29/2020  ? ?Sugars controlled ?Check A1c, urine microalbumin today ?Continue Mounjaro-increase to 5 mg weekly, continue Farxiga 10 mg daily ?Stressed regular exercise, diabetic diet ?Will adjust medication as needed ? ?

## 2021-04-26 DIAGNOSIS — E1122 Type 2 diabetes mellitus with diabetic chronic kidney disease: Secondary | ICD-10-CM | POA: Diagnosis not present

## 2021-04-26 DIAGNOSIS — J9611 Chronic respiratory failure with hypoxia: Secondary | ICD-10-CM | POA: Diagnosis not present

## 2021-04-26 DIAGNOSIS — I130 Hypertensive heart and chronic kidney disease with heart failure and stage 1 through stage 4 chronic kidney disease, or unspecified chronic kidney disease: Secondary | ICD-10-CM | POA: Diagnosis not present

## 2021-04-26 DIAGNOSIS — E1151 Type 2 diabetes mellitus with diabetic peripheral angiopathy without gangrene: Secondary | ICD-10-CM | POA: Diagnosis not present

## 2021-04-26 DIAGNOSIS — N1832 Chronic kidney disease, stage 3b: Secondary | ICD-10-CM | POA: Diagnosis not present

## 2021-04-26 DIAGNOSIS — J9612 Chronic respiratory failure with hypercapnia: Secondary | ICD-10-CM | POA: Diagnosis not present

## 2021-04-26 DIAGNOSIS — I5033 Acute on chronic diastolic (congestive) heart failure: Secondary | ICD-10-CM | POA: Diagnosis not present

## 2021-04-26 DIAGNOSIS — I87301 Chronic venous hypertension (idiopathic) without complications of right lower extremity: Secondary | ICD-10-CM | POA: Diagnosis not present

## 2021-04-26 DIAGNOSIS — D631 Anemia in chronic kidney disease: Secondary | ICD-10-CM | POA: Diagnosis not present

## 2021-04-29 ENCOUNTER — Telehealth: Payer: Self-pay | Admitting: Internal Medicine

## 2021-04-29 DIAGNOSIS — J9612 Chronic respiratory failure with hypercapnia: Secondary | ICD-10-CM | POA: Diagnosis not present

## 2021-04-29 DIAGNOSIS — N1832 Chronic kidney disease, stage 3b: Secondary | ICD-10-CM | POA: Diagnosis not present

## 2021-04-29 DIAGNOSIS — J9611 Chronic respiratory failure with hypoxia: Secondary | ICD-10-CM | POA: Diagnosis not present

## 2021-04-29 DIAGNOSIS — E1151 Type 2 diabetes mellitus with diabetic peripheral angiopathy without gangrene: Secondary | ICD-10-CM | POA: Diagnosis not present

## 2021-04-29 DIAGNOSIS — I5033 Acute on chronic diastolic (congestive) heart failure: Secondary | ICD-10-CM | POA: Diagnosis not present

## 2021-04-29 DIAGNOSIS — D631 Anemia in chronic kidney disease: Secondary | ICD-10-CM | POA: Diagnosis not present

## 2021-04-29 DIAGNOSIS — E1122 Type 2 diabetes mellitus with diabetic chronic kidney disease: Secondary | ICD-10-CM | POA: Diagnosis not present

## 2021-04-29 DIAGNOSIS — I130 Hypertensive heart and chronic kidney disease with heart failure and stage 1 through stage 4 chronic kidney disease, or unspecified chronic kidney disease: Secondary | ICD-10-CM | POA: Diagnosis not present

## 2021-04-29 DIAGNOSIS — I87301 Chronic venous hypertension (idiopathic) without complications of right lower extremity: Secondary | ICD-10-CM | POA: Diagnosis not present

## 2021-04-29 NOTE — Telephone Encounter (Signed)
Ebony Hail w/ [facility] making provider aware pt fell on 3-28 ? ?Emt was called to assist pt off the floor, pt declined ed visit ? ?Pt has bruising on upper rt thigh near groin area ? ?Pt complains of pain 10/10 w/ movement ?

## 2021-04-30 DIAGNOSIS — I130 Hypertensive heart and chronic kidney disease with heart failure and stage 1 through stage 4 chronic kidney disease, or unspecified chronic kidney disease: Secondary | ICD-10-CM | POA: Diagnosis not present

## 2021-04-30 DIAGNOSIS — J9612 Chronic respiratory failure with hypercapnia: Secondary | ICD-10-CM | POA: Diagnosis not present

## 2021-04-30 DIAGNOSIS — I87301 Chronic venous hypertension (idiopathic) without complications of right lower extremity: Secondary | ICD-10-CM | POA: Diagnosis not present

## 2021-04-30 DIAGNOSIS — E1151 Type 2 diabetes mellitus with diabetic peripheral angiopathy without gangrene: Secondary | ICD-10-CM | POA: Diagnosis not present

## 2021-04-30 DIAGNOSIS — D631 Anemia in chronic kidney disease: Secondary | ICD-10-CM | POA: Diagnosis not present

## 2021-04-30 DIAGNOSIS — N1832 Chronic kidney disease, stage 3b: Secondary | ICD-10-CM | POA: Diagnosis not present

## 2021-04-30 DIAGNOSIS — I5033 Acute on chronic diastolic (congestive) heart failure: Secondary | ICD-10-CM | POA: Diagnosis not present

## 2021-04-30 DIAGNOSIS — E1122 Type 2 diabetes mellitus with diabetic chronic kidney disease: Secondary | ICD-10-CM | POA: Diagnosis not present

## 2021-04-30 DIAGNOSIS — J9611 Chronic respiratory failure with hypoxia: Secondary | ICD-10-CM | POA: Diagnosis not present

## 2021-04-30 NOTE — Telephone Encounter (Signed)
Spoke with patient today. 

## 2021-05-02 DIAGNOSIS — I87301 Chronic venous hypertension (idiopathic) without complications of right lower extremity: Secondary | ICD-10-CM | POA: Diagnosis not present

## 2021-05-02 DIAGNOSIS — I130 Hypertensive heart and chronic kidney disease with heart failure and stage 1 through stage 4 chronic kidney disease, or unspecified chronic kidney disease: Secondary | ICD-10-CM | POA: Diagnosis not present

## 2021-05-02 DIAGNOSIS — N1832 Chronic kidney disease, stage 3b: Secondary | ICD-10-CM | POA: Diagnosis not present

## 2021-05-02 DIAGNOSIS — D631 Anemia in chronic kidney disease: Secondary | ICD-10-CM | POA: Diagnosis not present

## 2021-05-02 DIAGNOSIS — E1122 Type 2 diabetes mellitus with diabetic chronic kidney disease: Secondary | ICD-10-CM | POA: Diagnosis not present

## 2021-05-02 DIAGNOSIS — I5033 Acute on chronic diastolic (congestive) heart failure: Secondary | ICD-10-CM | POA: Diagnosis not present

## 2021-05-02 DIAGNOSIS — J9612 Chronic respiratory failure with hypercapnia: Secondary | ICD-10-CM | POA: Diagnosis not present

## 2021-05-02 DIAGNOSIS — E1151 Type 2 diabetes mellitus with diabetic peripheral angiopathy without gangrene: Secondary | ICD-10-CM | POA: Diagnosis not present

## 2021-05-02 DIAGNOSIS — J9611 Chronic respiratory failure with hypoxia: Secondary | ICD-10-CM | POA: Diagnosis not present

## 2021-05-06 ENCOUNTER — Telehealth (HOSPITAL_COMMUNITY): Payer: Self-pay | Admitting: *Deleted

## 2021-05-06 DIAGNOSIS — I5033 Acute on chronic diastolic (congestive) heart failure: Secondary | ICD-10-CM | POA: Diagnosis not present

## 2021-05-06 DIAGNOSIS — E1122 Type 2 diabetes mellitus with diabetic chronic kidney disease: Secondary | ICD-10-CM | POA: Diagnosis not present

## 2021-05-06 DIAGNOSIS — I130 Hypertensive heart and chronic kidney disease with heart failure and stage 1 through stage 4 chronic kidney disease, or unspecified chronic kidney disease: Secondary | ICD-10-CM | POA: Diagnosis not present

## 2021-05-06 DIAGNOSIS — I87301 Chronic venous hypertension (idiopathic) without complications of right lower extremity: Secondary | ICD-10-CM | POA: Diagnosis not present

## 2021-05-06 DIAGNOSIS — E1151 Type 2 diabetes mellitus with diabetic peripheral angiopathy without gangrene: Secondary | ICD-10-CM | POA: Diagnosis not present

## 2021-05-06 DIAGNOSIS — N1832 Chronic kidney disease, stage 3b: Secondary | ICD-10-CM | POA: Diagnosis not present

## 2021-05-06 DIAGNOSIS — J9611 Chronic respiratory failure with hypoxia: Secondary | ICD-10-CM | POA: Diagnosis not present

## 2021-05-06 DIAGNOSIS — D631 Anemia in chronic kidney disease: Secondary | ICD-10-CM | POA: Diagnosis not present

## 2021-05-06 DIAGNOSIS — J9612 Chronic respiratory failure with hypercapnia: Secondary | ICD-10-CM | POA: Diagnosis not present

## 2021-05-06 NOTE — Telephone Encounter (Signed)
Pt aware and agreeable with plan.  

## 2021-05-06 NOTE — Telephone Encounter (Signed)
Pt called c/o weight gain. Pt said she fell 3/27 and her weight was 348lbs she is 353lbs today and was advised by her physical therapist to call our office. Pt denies swelling and shortness of breath. Pt asked if she needed to increase her diuretic. ? ? ?Routed to [place] for advice ?

## 2021-05-09 ENCOUNTER — Ambulatory Visit: Payer: Medicare PPO | Admitting: Pulmonary Disease

## 2021-05-09 ENCOUNTER — Ambulatory Visit: Payer: Medicare PPO

## 2021-05-09 DIAGNOSIS — E1151 Type 2 diabetes mellitus with diabetic peripheral angiopathy without gangrene: Secondary | ICD-10-CM | POA: Diagnosis not present

## 2021-05-09 DIAGNOSIS — D631 Anemia in chronic kidney disease: Secondary | ICD-10-CM | POA: Diagnosis not present

## 2021-05-09 DIAGNOSIS — I87301 Chronic venous hypertension (idiopathic) without complications of right lower extremity: Secondary | ICD-10-CM | POA: Diagnosis not present

## 2021-05-09 DIAGNOSIS — J9612 Chronic respiratory failure with hypercapnia: Secondary | ICD-10-CM | POA: Diagnosis not present

## 2021-05-09 DIAGNOSIS — E1122 Type 2 diabetes mellitus with diabetic chronic kidney disease: Secondary | ICD-10-CM | POA: Diagnosis not present

## 2021-05-09 DIAGNOSIS — N1832 Chronic kidney disease, stage 3b: Secondary | ICD-10-CM | POA: Diagnosis not present

## 2021-05-09 DIAGNOSIS — I130 Hypertensive heart and chronic kidney disease with heart failure and stage 1 through stage 4 chronic kidney disease, or unspecified chronic kidney disease: Secondary | ICD-10-CM | POA: Diagnosis not present

## 2021-05-09 DIAGNOSIS — J9611 Chronic respiratory failure with hypoxia: Secondary | ICD-10-CM | POA: Diagnosis not present

## 2021-05-09 DIAGNOSIS — I5033 Acute on chronic diastolic (congestive) heart failure: Secondary | ICD-10-CM | POA: Diagnosis not present

## 2021-05-10 ENCOUNTER — Other Ambulatory Visit: Payer: Self-pay | Admitting: Internal Medicine

## 2021-05-12 ENCOUNTER — Telehealth: Payer: Self-pay | Admitting: Internal Medicine

## 2021-05-12 MED ORDER — SPIRONOLACTONE 25 MG PO TABS: ORAL_TABLET | ORAL | 0 refills | Status: DC

## 2021-05-12 NOTE — Telephone Encounter (Signed)
1.Medication Requested: ?spironolactone (ALDACTONE) 25 MG tablet ?2. Pharmacy (Name, Street, [place]): ?[facility] [phone] - [place], [address] [place] Phone:  [phone]  ?Fax:  [phone]  ?  ? ?3. On Med List:y  ? ?4. Last Visit with PCP: ? ?5. Next visit date with PCP: ? ? ?Agent: Please be advised that RX refills may take up to 3 business days. We ask that you follow-up with your pharmacy.  ?

## 2021-05-13 DIAGNOSIS — D631 Anemia in chronic kidney disease: Secondary | ICD-10-CM | POA: Diagnosis not present

## 2021-05-13 DIAGNOSIS — I5033 Acute on chronic diastolic (congestive) heart failure: Secondary | ICD-10-CM | POA: Diagnosis not present

## 2021-05-13 DIAGNOSIS — N1832 Chronic kidney disease, stage 3b: Secondary | ICD-10-CM | POA: Diagnosis not present

## 2021-05-13 DIAGNOSIS — E1151 Type 2 diabetes mellitus with diabetic peripheral angiopathy without gangrene: Secondary | ICD-10-CM | POA: Diagnosis not present

## 2021-05-13 DIAGNOSIS — I87301 Chronic venous hypertension (idiopathic) without complications of right lower extremity: Secondary | ICD-10-CM | POA: Diagnosis not present

## 2021-05-13 DIAGNOSIS — E1122 Type 2 diabetes mellitus with diabetic chronic kidney disease: Secondary | ICD-10-CM | POA: Diagnosis not present

## 2021-05-13 DIAGNOSIS — J9612 Chronic respiratory failure with hypercapnia: Secondary | ICD-10-CM | POA: Diagnosis not present

## 2021-05-13 DIAGNOSIS — J9611 Chronic respiratory failure with hypoxia: Secondary | ICD-10-CM | POA: Diagnosis not present

## 2021-05-13 DIAGNOSIS — I130 Hypertensive heart and chronic kidney disease with heart failure and stage 1 through stage 4 chronic kidney disease, or unspecified chronic kidney disease: Secondary | ICD-10-CM | POA: Diagnosis not present

## 2021-05-15 NOTE — Progress Notes (Signed)
? ? ?Subjective:  ? ? Patient ID: April [name], female    DOB: 15-May-1950, 71 y.o.   MRN: [id number] ? ?This visit occurred during the SARS-CoV-2 public health emergency.  Safety protocols were in place, including screening questions prior to the visit, additional usage of staff PPE, and extensive cleaning of exam room while observing appropriate contact time as indicated for disinfecting solutions. ? ? ? ?HPI ?April [name] is here for  ?Chief Complaint  ?Patient presents with  ? Fall  ?  March 27th; Pain on right side by groin and bruised all over; patient states groin area hurts and feels warm to the touch  ? Constipation  ? ? ?Fall - 3/27 - walking with cane.  Prongs of can went into right upper thigh.  Bruises all over.  Warm and hard in right upper medial thigh.  Bruising on legs, arms, chest.  Has taken pain medication.   ? ?Due for B12 injection  ? ? ? ?Medications and allergies reviewed with patient and updated if appropriate. ? ?Current Outpatient Medications on File Prior to Visit  ?Medication Sig Dispense Refill  ? acetaminophen (TYLENOL) 650 MG CR tablet Take 1,300 mg by mouth every 8 (eight) hours as needed for pain.    ? Alcohol Swabs (DROPSAFE ALCOHOL PREP) 70 % PADS USE TOPICALLY AS DIRECTED 300 each 3  ? allopurinol (ZYLOPRIM) 100 MG tablet Take 2 tablets (200 mg total) by mouth daily. (Patient taking differently: Take 200 mg by mouth daily as needed (gout).) 180 tablet 1  ? apixaban (ELIQUIS) 5 MG TABS tablet Take 1 tablet (5 mg total) by mouth 2 (two) times daily. 60 tablet 1  ? atorvastatin (LIPITOR) 40 MG tablet Take 1 tablet (40 mg total) by mouth at bedtime. 90 tablet 2  ? Blood Glucose Calibration (TRUE METRIX LEVEL 1) Low SOLN UAD  E11.9 1 each 3  ? Blood Glucose Monitoring Suppl (TRUE METRIX METER) DEVI True Metrix Level 1 solution    ? Budeson-Glycopyrrol-Formoterol (BREZTRI AEROSPHERE) 160-9-4.8 MCG/ACT AERO Inhale 2 puffs into the lungs in the morning and at bedtime. 10.7 g 5  ?  Budeson-Glycopyrrol-Formoterol (BREZTRI AEROSPHERE) 160-9-4.8 MCG/ACT AERO Inhale 2 puffs into the lungs in the morning and at bedtime. 10.7 g 0  ? calcium carbonate (TUMS - DOSED IN MG ELEMENTAL CALCIUM) 500 MG chewable tablet Chew 2 tablets by mouth daily as needed for indigestion or heartburn.    ? cholecalciferol (VITAMIN D3) 25 MCG (1000 UNIT) tablet Take 1 tablet (1,000 Units total) by mouth daily. 90 tablet 1  ? cyanocobalamin (,VITAMIN B-12,) 1000 MCG/ML injection INJECT 1ML INTO MUSCLE EVERY 30 DAYS 10 mL 5  ? dapagliflozin propanediol (FARXIGA) 10 MG TABS tablet Take 10 mg by mouth daily.    ? diclofenac Sodium (VOLTAREN) 1 % GEL Apply 1 application topically as needed (pain).    ? diphenhydrAMINE (BENADRYL) 12.5 MG/5ML liquid Take 25 mg by mouth 4 (four) times daily as needed for allergies.    ? eszopiclone (LUNESTA) 1 MG TABS tablet Take 1 mg by mouth at bedtime as needed for sleep. Take immediately before bedtime    ? famotidine (PEPCID) 20 MG tablet Take 1 tablet (20 mg total) by mouth 2 (two) times daily. 180 tablet 3  ? FEROSUL 325 (65 Fe) MG tablet TAKE 1 TABLET BY MOUTH EVERY MONDAY, WEDNESDAY, AND FRIDAY 60 tablet 0  ? fluticasone (FLONASE) 50 MCG/ACT nasal spray Place 2 sprays into both nostrils daily. 18.2 mL 2  ?  gabapentin (NEURONTIN) 100 MG capsule TAKE 1 CAPSULE(100 MG) BY MOUTH THREE TIMES DAILY 90 capsule 1  ? guaiFENesin-dextromethorphan (ROBITUSSIN DM) 100-10 MG/5ML syrup Take 5 mLs by mouth every 4 (four) hours as needed for cough.    ? HYDROcodone bit-homatropine (HYCODAN) 5-1.5 MG/5ML syrup Take 5 mLs by mouth every 8 (eight) hours as needed for cough. 120 mL 0  ? hydrOXYzine (ATARAX/VISTARIL) 25 MG tablet Take 1 tablet (25 mg total) by mouth 3 (three) times daily as needed for itching. 30 tablet 0  ? loratadine (CLARITIN) 10 MG tablet Take 1 tablet (10 mg total) by mouth daily. 30 tablet 5  ? MITIGARE 0.6 MG CAPS Take by mouth.    ? montelukast (SINGULAIR) 10 MG tablet TAKE 1  TABLET(10 MG) BY MOUTH AT BEDTIME 30 tablet 5  ? nebivolol (BYSTOLIC) 5 MG tablet Take 1 tablet (5 mg total) by mouth daily. 30 tablet 3  ? ondansetron (ZOFRAN) 4 MG tablet Take 1 tablet (4 mg total) by mouth every 8 (eight) hours as needed for nausea or vomiting. 20 tablet 0  ? Oxcarbazepine (TRILEPTAL) 300 MG tablet Take 1 tablet (300 mg total) by mouth 2 (two) times daily. 180 tablet 1  ? oxyCODONE (OXY IR/ROXICODONE) 5 MG immediate release tablet Take 1 tablet (5 mg total) by mouth every 6 (six) hours as needed for severe pain. 30 tablet 0  ? polyethylene glycol (MIRALAX / GLYCOLAX) 17 g packet Take 17 g by mouth daily as needed for moderate constipation.    ? potassium chloride SA (KLOR-CON M) 20 MEQ tablet Take 1 tablet (20 mEq total) by mouth daily. 90 tablet 1  ? Spacer/Aero-Holding Dorise Bullion Use with inhaler. 1 each 2  ? spironolactone (ALDACTONE) 25 MG tablet TAKE 1 TABLET(25 MG) BY MOUTH DAILY 90 tablet 0  ? tirzepatide (MOUNJARO) 5 MG/0.5ML Pen Inject 5 mg into the skin once a week. 6 mL 0  ? torsemide (DEMADEX) 20 MG tablet Take 40 mg by mouth 2 (two) times daily.    ? VENTOLIN HFA 108 (90 Base) MCG/ACT inhaler Inhale 2 puffs into the lungs every 4 (four) hours as needed for wheezing.    ? ?No current facility-administered medications on file prior to visit.  ? ? ?Review of Systems ? ?   ?Objective:  ? ?Vitals:  ? 05/16/21 1133  ?BP: 130/68  ?Pulse: 86  ?Temp: 99.2 ?F (37.3 ?C)  ?SpO2: 96%  ? ?BP Readings from Last 3 Encounters:  ?05/16/21 130/68  ?05/16/21 102/64  ?04/25/21 126/76  ? ?Wt Readings from Last 3 Encounters:  ?05/16/21 (!) 345 lb (156.5 kg)  ?04/04/21 (!) 348 lb (157.9 kg)  ?01/14/21 (!) 362 lb (164.2 kg)  ? ?Body mass index is 59.22 kg/m?. ? ?  ?Physical Exam ?Constitutional:   ?   General: She is not in acute distress. ?HENT:  ?   Head: Normocephalic.  ?Skin: ?   General: Skin is warm and dry.  ?   Findings: Bruising (Mild bruising left upper arm, left leg, significant bruising right  upper leg-right medial thigh to knee there is bruising that is indurated and very tender to touch) present.  ?Neurological:  ?   Mental Status: She is alert.  ? ?   ? ? ? ? ? ?Assessment & Plan:  ? ? ?See Problem List for Assessment and Plan of chronic medical problems.  ? ? ? ? ?

## 2021-05-15 NOTE — Patient Instructions (Addendum)
? ?  Use the heating pad ? ? ? ?B12 injection given today.  ? ? ?Medications changes include :   decrease eliquis to 2.5 mg twice daily.   ? ? ?Your prescription(s) have been sent to your pharmacy.  ? ? ? ?

## 2021-05-16 ENCOUNTER — Encounter: Payer: Self-pay | Admitting: Internal Medicine

## 2021-05-16 ENCOUNTER — Ambulatory Visit: Payer: Medicare PPO | Admitting: Internal Medicine

## 2021-05-16 ENCOUNTER — Encounter: Payer: Self-pay | Admitting: Nurse Practitioner

## 2021-05-16 ENCOUNTER — Ambulatory Visit: Payer: Medicare PPO | Admitting: Nurse Practitioner

## 2021-05-16 VITALS — BP 130/68 | HR 86 | Temp 99.2°F | Ht 64.0 in

## 2021-05-16 VITALS — BP 102/64 | HR 94 | Temp 99.3°F | Ht 64.0 in | Wt 345.0 lb

## 2021-05-16 DIAGNOSIS — J302 Other seasonal allergic rhinitis: Secondary | ICD-10-CM | POA: Insufficient documentation

## 2021-05-16 DIAGNOSIS — E538 Deficiency of other specified B group vitamins: Secondary | ICD-10-CM | POA: Diagnosis not present

## 2021-05-16 DIAGNOSIS — J45909 Unspecified asthma, uncomplicated: Secondary | ICD-10-CM | POA: Diagnosis not present

## 2021-05-16 DIAGNOSIS — Z86718 Personal history of other venous thrombosis and embolism: Secondary | ICD-10-CM

## 2021-05-16 DIAGNOSIS — J301 Allergic rhinitis due to pollen: Secondary | ICD-10-CM

## 2021-05-16 DIAGNOSIS — T148XXA Other injury of unspecified body region, initial encounter: Secondary | ICD-10-CM | POA: Insufficient documentation

## 2021-05-16 DIAGNOSIS — J9611 Chronic respiratory failure with hypoxia: Secondary | ICD-10-CM | POA: Diagnosis not present

## 2021-05-16 DIAGNOSIS — J9612 Chronic respiratory failure with hypercapnia: Secondary | ICD-10-CM | POA: Diagnosis not present

## 2021-05-16 DIAGNOSIS — W19XXXA Unspecified fall, initial encounter: Secondary | ICD-10-CM | POA: Insufficient documentation

## 2021-05-16 DIAGNOSIS — W19XXXS Unspecified fall, sequela: Secondary | ICD-10-CM

## 2021-05-16 DIAGNOSIS — I5032 Chronic diastolic (congestive) heart failure: Secondary | ICD-10-CM

## 2021-05-16 DIAGNOSIS — E662 Morbid (severe) obesity with alveolar hypoventilation: Secondary | ICD-10-CM | POA: Diagnosis not present

## 2021-05-16 MED ORDER — APIXABAN 2.5 MG PO TABS: 2.5000 mg | ORAL_TABLET | Freq: Two times a day (BID) | ORAL | 1 refills | Status: DC

## 2021-05-16 MED ORDER — CYANOCOBALAMIN 1000 MCG/ML IJ SOLN
1000.0000 ug | Freq: Once | INTRAMUSCULAR | Status: AC
  Administered 2021-05-16: 1000 ug via INTRAMUSCULAR

## 2021-05-16 MED ORDER — BREZTRI AEROSPHERE 160-9-4.8 MCG/ACT IN AERO: 2.0000 | INHALATION_SPRAY | Freq: Two times a day (BID) | RESPIRATORY_TRACT | 5 refills | Status: DC

## 2021-05-16 MED ORDER — BREZTRI AEROSPHERE 160-9-4.8 MCG/ACT IN AERO
2.0000 | INHALATION_SPRAY | Freq: Two times a day (BID) | RESPIRATORY_TRACT | 0 refills | Status: DC
Start: 2021-05-16 — End: 2022-05-04

## 2021-05-16 NOTE — Assessment & Plan Note (Signed)
Breathing overall stable. Very minimal end expiratory wheeze on exam. Advised her to notify if worsening SOB or wheezing develops. She did notice an improvement with Breztri. We will provide another sample and send rx today. Continue singulair at night and Claritin for trigger prevention. ? ?Patient Instructions  ?-Continue on BiPAP auto 5-20, pressure support of 4 with backup rate of 10 for CO2 blow off and to reduce your risk of rehospitalization  ?Use for a minimum 6 hours a night, with goal of using entire night.  ?Change equipment every 30 days or as directed by DME.  ?Maintain clean equipment, as directed by home health agency.  ?Be aware of reduced alertness and do not drive or operate heavy machinery if experiencing this or drowsiness.  ?Exercise encouraged, as tolerated. ?Healthy weight management discussed.  ?Avoid or decrease alcohol consumption and medications that make you more sleepy, if possible. ?-Continue nighttime supplemental O2 therapy at 1L.  ?-Continue 3 lpm supplemental oxygen on POC with activity. Goal oxygen >88-90%. Notify of increasing requirements.  ?  ?-Stop Symbicort. Start Breztri 2 puffs Twice daily for 2 weeks. Brush tongue and rinse mouth afterwards.  ?-Continue Pulmicort inhaler 2 puffs twice a day as needed for shortness of breath or wheezing  ?-Continue Ventolin inhaler 2 puffs as needed every 4 hours as needed for shortness of breath or wheezing  ?-Continue Singulair 10 mg At bedtime ?-Continue Claritin 10 mg daily for allergies  ?-Continue Flonase 2 sprays each nostril daily  ?-Continue Mucinex 600 mg Twice daily for chest congestion ?-Continue Delsym 2 tsp Twice daily as needed for cough ?-Continue Tessalon Perles 1 capsule Three times a day as needed for cough ?  ?Follow up in 3 months with Dr. Ander Slade. If symptoms do not improve or worsen, please contact office for sooner follow up or seek emergency care ? ? ?

## 2021-05-16 NOTE — Assessment & Plan Note (Signed)
Well-controlled on current regimen. We will continue Claritin, Singulair and Flonase ?

## 2021-05-16 NOTE — Assessment & Plan Note (Signed)
History of DVT 05/2020 ?Currently on Eliquis 5 mg twice daily-did see heme-onc last fall to assess benefit of long-term Eliquis given increased risk for recurrent DVT from sedentary lifestyle/obesity and she has been continued on full dose ?Given recent fall and extensive bruising I will go ahead and decrease her Eliquis to 2.5 mg twice daily ?She sees heme-onc next month and they can reevaluate and discuss further ?

## 2021-05-16 NOTE — Progress Notes (Signed)
? ?'@Patient'$  ID: April [name], female    DOB: [birth year], 71 y.o.   MRN: [id number] ? ?Chief Complaint  ?Patient presents with  ? Follow-up  ?  Follow up.   ? ? ?Referring provider: ?[name], MD ? ?HPI: ?71 year old female, never smoker followed for nocturnal hypoxemia, pulmonary hypertension, obesity hypoventilation syndrome and asthma.  She is a patient of Dr. Helyn App and was last seen in office on 04/04/2021.  Past medical history significant for CHF, former DVT on chronic anticoagulation, hypertension, CAD, GERD, PCP, DM2, morbid obesity, CKD stage III, HLD ? ?TEST/EVENTS:  ?05/07/2020 CTA chest: Mild cardiomegaly suggestive of PA hypertension.  Atherosclerosis.  No lymphadenopathy.  Minimal groundglass opacities in the lung bases.  There is slight suboptimal opacification of main PA without central or proximal segmental PE.  Small hiatal hernia ?11/18/2020 echocardiogram: EF 65 to 70%.  LVH.  G1 DD.  The interventricular septum is flattened consistent with RV pressure and volume overload ?11/19/2020 right heart cath: Mild to moderate pulmonary hypertension there is minimally elevated PC WP.  Normal CO ?12/09/2020 split-night study: Negative for OSA.  Nocturnal desaturations to 84% requiring 1 L/min O2.  Recommended continued use of BiPAP for CO2 blow off and 1 L supplemental O2 at night ?04/04/2021 CXR 2 view: Frontal and lateral views of the chest were unremarkable.  There was no acute airspace disease noted. ? ?10/02/2020: OV with Dr. Ander Slade. Scheduled in lab split-night. Lunesta low dose for sleep. Weight loss advised but limits with severe arthritis. Encouraged to decrease naps and consolidate sleep at night. F/u in 3-4 months.  ?  ?11/15/2020 - 11/23/2018: Hospital admission for acute on chronic CHF exacerbation and acute on chronic hypoxic hypercapnic respiratory failure.  pH 7.29 PCO2 of 80 and PO2 of 60. IV diuresis with Lasix.  Right heart cath on 10/19.  PCCM consulted with recommendations for BiPAP  versus NIPPV upon discharge. ?  ?01/03/2021: OV with Breelle Hollywood NP for follow up after sleep study and overdue hospital follow up. Breathing stable. Continued on BiPAP and supplemental oxygen at night 1 lpm. Continued on Symbicort for maintenance. PRN Ventolin. Singulair at bedtime.  ? ?04/04/2021: OV with Jantz Main NP for follow-up.  Felt like she had been having to use her oxygen more during the day and not just at night.  Progressive DOE.  Consistently wearing 2 L/min with activity; reports she has been doing this for the past few months.  Started after we saw her last.  Continues to wear BiPAP at night along with 1 L/min supplemental O2.  She was concerned that her asthma was flaring as she had not had any increased cough over the past week.  Suspected that DOE was multifactorial related to CHF, obesity, deconditioning and slight asthma component.  She was unable to complete FeNO however there was no significant bronchospasm on exam.  Trialed step up in therapy to [facility].  Continued Singulair at night.  Added Claritin for trigger prevention.  Flonase for postnasal drainage control.  And Delsym and Tessalon Perles for cough control.  Suspected that there was an upper airway component to her cough.  Walking oximetry with desaturations to 84% on room air.  She was placed on 3 L/min POC and maintain saturations in the 90s. CXR and labs unremarkable.  ? ?05/16/2021: Today-follow-up ?Patient presents today with husband for follow-up.  Overall her breathing has been relatively stable and at baseline.  Feels like she did notice a improvement with the Breztri.  She ran out  of samples and went back to her Symbicort.  Cough is overall stable as well.  Relatively infrequent and usually dry.  Her biggest complaint was that she fell at the end of March and has had generalized pain since.  She does have a place in her groin where she landed on her cane that is giving her the most trouble.  She [facility]  visit today to discuss this.  She denies any lightheaded or dizziness.  No loss of consciousness with fall.  Allergies seem relatively well controlled.  Does still have occasional postnasal drip but improved with her changes last time.  She continues on BiPAP nightly.  She has yet to receive her POC from her DME company.  She has been using her oxygen concentrator at home.  She is on room air currently in office with oxygen saturations in the 90s.  She is usually able to maintain saturations at rest but drops to the 80s with activity.   ? ?Allergies  ?Allergen Reactions  ? Penicillins Rash and Other (See Comments)  ?  She was told not to take it anymore. ?Has patient had a PCN reaction causing immediate rash, facial/tongue/throat swelling, SOB or lightheadedness with hypotension: Yes ?Has patient had a PCN reaction causing severe rash involving mucus membranes or skin necrosis: No ?Has patient had a PCN reaction that required hospitalization: No ?Has patient had a PCN reaction occurring within the last 10 years: No ?If all of the above answers are "NO", then may proceed with Cephalosporin use.'  ? Shellfish Allergy Anaphylaxis  ? Sulfonamide Derivatives Anaphylaxis and Other (See Comments)  ? Hydrochlorothiazide W-Triamterene Other (See Comments)  ?  Hypokalemia  ? Lotensin [Benazepril Hcl] Hives  ? Dipyridamole Other (See Comments)  ?  Unknown reaction  ? Estrogens Other (See Comments)  ?  Unknown reaction  ? Hydrochlorothiazide Other (See Comments)  ?  Unknown reaction, Hypokalemia?  ? Latex Other (See Comments)  ?  Unknown reaction - stated previously during surgery gloves had to be changed, but unsure if it is still an allergy or not.   ? Metronidazole Other (See Comments)  ?  Unknown reaction  ? Other Itching  ?  PICKLES  ? Spironolactone Other (See Comments)  ?  UNSPECIFIED > "kidney problems"  ? Torsemide Other (See Comments)  ?  Unknown reaction  ? Valsartan Other (See Comments)  ?  Unknown reaction  ?  Madelaine Bhat Isothiocyanate] Itching  ? ? ?Immunization History  ?Administered Date(s) Administered  ? Fluad Quad(high Dose 65+) 10/28/2018, 10/18/2020  ? Influenza Split 12/03/2010, 11/03/2011  ? Influenza Whole 11/16/2006, 11/22/2007, 10/31/2008, 10/29/2009  ? Influenza, High Dose Seasonal PF 10/10/2015, 11/06/2016, 11/16/2017, 11/08/2019  ? Influenza,inj,Quad PF,6+ Mos 10/19/2012, 10/25/2013, 10/29/2014  ? Influenza-Unspecified 10/23/2016, 11/18/2016  ? Moderna Sars-Covid-2 Vaccination 03/13/2019, 04/10/2019, 12/06/2019  ? Pension scheme manager 17yr & up 11/08/2020  ? Pneumococcal Conjugate-13 06/07/2015  ? Pneumococcal Polysaccharide-23 03/24/2005, 01/04/2012, 06/29/2017  ? Td 06/24/2009  ? Tdap 10/09/2014  ? Zoster Recombinat (Shingrix) 08/11/2016, 10/23/2016  ? Zoster, Live 01/15/2012  ? ? ?Past Medical History:  ?Diagnosis Date  ? Anemia   ? Asthma   ? CAD (coronary artery disease)   ? Carpal tunnel syndrome   ? Cellulitis of both lower extremities 04/11/2015  ? CHF (congestive heart failure) ([place]   ? Chronic kidney disease   ? CKI- [facility]  ? Colon polyp, hyperplastic 2007 &  5003  ? Complication of anesthesia 1999  ? svt with renal calculi surgery, no problems since  ? CTS (carpal tunnel syndrome)   ? bilateral  ? Diabetes mellitus   ? Eczema   ? GERD (gastroesophageal reflux disease)   ? History of kidney stones 1999  ? Hyperlipidemia   ? Hypertension   ? Leg ulcer ([place]) 04/24/2015  ? Right lateral leg No evidence of an infection Monitor closely Keep edema controlled   ? Leg ulcer ([place])   ? right lower  ? Meralgia paresthetica   ? Dr. [name]  ? Morbid obesity ([place])   ? Neuropathy   ? toes and legs  ? Osteoarthrosis, unspecified whether generalized or localized, lower leg   ? knee  ? PUD (peptic ulcer disease)   ? Shortness of breath dyspnea   ? with exertion  ? Sleep apnea   ? per progress note 02/25/2018  ? Type II or unspecified type diabetes mellitus without mention  of complication, not stated as uncontrolled   ? Unspecified hereditary and idiopathic peripheral neuropathy   ? Urticaria   ? Vitamin B12 deficiency   ? Wears glasses   ? Wound cellulitis   ? right uppe

## 2021-05-16 NOTE — Assessment & Plan Note (Signed)
Chronic ?Continue monthly B12 injections ?B12 injection today ?

## 2021-05-16 NOTE — Assessment & Plan Note (Signed)
Acute ?She fell 3/27 and her cane went into her right upper leg and she has significant bruising as a result ?She has some mild bruising in several other areas of her body ?She is having significant pain and bruising ?Advised heating pad.  Can take Tylenol or her pain medication as needed ?Discussed that it will take weeks her body to reabsorb some of the blood from the hematoma and the pain will slowly improve ?

## 2021-05-16 NOTE — Assessment & Plan Note (Signed)
On BiPAP nightly for CO2 blow off. Continues to receive good benefit.  

## 2021-05-16 NOTE — Assessment & Plan Note (Signed)
Fall end of March. Persistent pain in left knee and groin since. Seeing PCP to discuss today.  ?

## 2021-05-16 NOTE — Addendum Note (Signed)
Addended by: Marcina Millard on: 05/16/2021 01:07 PM ? ? Modules accepted: Orders ? ?

## 2021-05-16 NOTE — Addendum Note (Signed)
Addended by: Fran Lowes on: 05/16/2021 11:26 AM ? ? Modules accepted: Orders ? ?

## 2021-05-16 NOTE — Assessment & Plan Note (Signed)
Appears compensate on exam. Follow up with cardiology as scheduled.  ?

## 2021-05-16 NOTE — Assessment & Plan Note (Signed)
Maintains saturation in the 90's on room air at rest. Desats to 80's with activity. She was supposed to receive POC from DME after last visit but never did. Will send new order today for 3 lpm with activity. Goal SpO2 >88-90%. Continue 1 lpm supplemental O2 at night.  ?

## 2021-05-16 NOTE — Patient Instructions (Addendum)
-  Continue on BiPAP auto 5-20, pressure support of 4 with backup rate of 10 for CO2 blow off and to reduce your risk of rehospitalization  ?Use for a minimum 6 hours a night, with goal of using entire night.  ?Change equipment every 30 days or as directed by DME.  ?Maintain clean equipment, as directed by home health agency.  ?Be aware of reduced alertness and do not drive or operate heavy machinery if experiencing this or drowsiness.  ?Exercise encouraged, as tolerated. ?Healthy weight management discussed.  ?Avoid or decrease alcohol consumption and medications that make you more sleepy, if possible. ?-Continue nighttime supplemental O2 therapy at 1L.  ?-Continue 3 lpm supplemental oxygen on POC with activity. Goal oxygen >88-90%. Notify of increasing requirements.  ?  ?-Stop Symbicort. Start Breztri 2 puffs Twice daily for 2 weeks. Brush tongue and rinse mouth afterwards.  ?-Continue Pulmicort inhaler 2 puffs twice a day as needed for shortness of breath or wheezing  ?-Continue Ventolin inhaler 2 puffs as needed every 4 hours as needed for shortness of breath or wheezing  ?-Continue Singulair 10 mg At bedtime ?-Continue Claritin 10 mg daily for allergies  ?-Continue Flonase 2 sprays each nostril daily  ?-Continue Mucinex 600 mg Twice daily for chest congestion ?-Continue Delsym 2 tsp Twice daily as needed for cough ?-Continue Tessalon Perles 1 capsule Three times a day as needed for cough ?  ?Follow up in 3 months with Dr. Ander Slade. If symptoms do not improve or worsen, please contact office for sooner follow up or seek emergency care ?

## 2021-05-21 ENCOUNTER — Telehealth: Payer: Self-pay | Admitting: Internal Medicine

## 2021-05-21 DIAGNOSIS — I130 Hypertensive heart and chronic kidney disease with heart failure and stage 1 through stage 4 chronic kidney disease, or unspecified chronic kidney disease: Secondary | ICD-10-CM | POA: Diagnosis not present

## 2021-05-21 DIAGNOSIS — N1832 Chronic kidney disease, stage 3b: Secondary | ICD-10-CM | POA: Diagnosis not present

## 2021-05-21 DIAGNOSIS — I5033 Acute on chronic diastolic (congestive) heart failure: Secondary | ICD-10-CM | POA: Diagnosis not present

## 2021-05-21 DIAGNOSIS — I87301 Chronic venous hypertension (idiopathic) without complications of right lower extremity: Secondary | ICD-10-CM | POA: Diagnosis not present

## 2021-05-21 DIAGNOSIS — J9611 Chronic respiratory failure with hypoxia: Secondary | ICD-10-CM | POA: Diagnosis not present

## 2021-05-21 DIAGNOSIS — E1122 Type 2 diabetes mellitus with diabetic chronic kidney disease: Secondary | ICD-10-CM | POA: Diagnosis not present

## 2021-05-21 DIAGNOSIS — E1151 Type 2 diabetes mellitus with diabetic peripheral angiopathy without gangrene: Secondary | ICD-10-CM | POA: Diagnosis not present

## 2021-05-21 DIAGNOSIS — D631 Anemia in chronic kidney disease: Secondary | ICD-10-CM | POA: Diagnosis not present

## 2021-05-21 DIAGNOSIS — J9612 Chronic respiratory failure with hypercapnia: Secondary | ICD-10-CM | POA: Diagnosis not present

## 2021-05-21 NOTE — Telephone Encounter (Signed)
Okay for orders? 

## 2021-05-21 NOTE — Telephone Encounter (Signed)
Home Health verbal orders-caller/Agency: [facility] ? ?Callback number: [phone] secure vm per caller ? ?Requesting OT/PT/Skilled nursing/Social Work/Speech: PT and Nursing Aide ? ?Frequency: 1w9 ?

## 2021-05-22 ENCOUNTER — Telehealth: Payer: Self-pay | Admitting: Nurse Practitioner

## 2021-05-22 ENCOUNTER — Telehealth: Payer: Self-pay | Admitting: Internal Medicine

## 2021-05-22 MED ORDER — TORSEMIDE 20 MG PO TABS: 40.0000 mg | ORAL_TABLET | Freq: Two times a day (BID) | ORAL | 2 refills | Status: DC

## 2021-05-22 MED ORDER — TIRZEPATIDE 7.5 MG/0.5ML ~~LOC~~ SOAJ: 7.5000 mg | SUBCUTANEOUS | 0 refills | Status: DC

## 2021-05-22 NOTE — Telephone Encounter (Signed)
Message left with verbals ?

## 2021-05-22 NOTE — Telephone Encounter (Signed)
Pt called back to report that all pharmacies she has reached out to are  out of the '7mg'$  of tirzepatide [facility]) 5 MG/0.5ML Pen.  ?Pt is requesting to be put on something higher that the '5mg'$ . ?Please advise ?[phone] ?

## 2021-05-22 NOTE — Telephone Encounter (Signed)
Pt requesting a cb to discuss more details regarding assistance w/ apixaban (ELIQUIS) 2.5 MG TABS tablet ? ?Per Linna Hoff to receive assistance for eliquis the pt has to have to have spent 3% of their annual income on out of pocket on pharmacy copays to begin application process ? ? ?

## 2021-05-22 NOTE — Telephone Encounter (Signed)
1.Medication Requested: tirzepatide [facility]) 5 MG/0.5ML Pen ? ?torsemide (DEMADEX) 20 MG tablet ? ? ?2. Pharmacy (Name, Street, [place]): [phone] - [place], [address] ? ?3. On Med List: Y ? ?4. Last Visit with PCP: 05-30-2021 ? ?5. Next visit date with PCP: 08-29-2021 ? ? ?Provider did not prescribe torsemide (DEMADEX) 20 MG tablet ?

## 2021-05-23 ENCOUNTER — Other Ambulatory Visit: Payer: Self-pay

## 2021-05-23 MED ORDER — TIRZEPATIDE 5 MG/0.5ML ~~LOC~~ SOAJ: 5.0000 mg | SUBCUTANEOUS | 0 refills | Status: DC

## 2021-05-23 NOTE — Telephone Encounter (Signed)
Prescriber portion of application has been filled out and placed for April [name] to sign so it can then be faxed to [employer] for pt. ? ?Called and spoke with pt letting her know this had been done and she verbalized understanding. Nothing further needed. ?

## 2021-05-23 NOTE — Telephone Encounter (Signed)
Either send to different pharmacy or stay on 5 mg for one more month then increase to 7.5. ?

## 2021-05-23 NOTE — Telephone Encounter (Signed)
Info given to patient today. ?

## 2021-05-23 NOTE — Telephone Encounter (Signed)
Patient wants to stay on the 5 mg and increase to 7.5 next month. ?

## 2021-05-26 DIAGNOSIS — I130 Hypertensive heart and chronic kidney disease with heart failure and stage 1 through stage 4 chronic kidney disease, or unspecified chronic kidney disease: Secondary | ICD-10-CM | POA: Diagnosis not present

## 2021-05-26 DIAGNOSIS — Z9981 Dependence on supplemental oxygen: Secondary | ICD-10-CM

## 2021-05-26 DIAGNOSIS — Z9181 History of falling: Secondary | ICD-10-CM

## 2021-05-26 DIAGNOSIS — M5442 Lumbago with sciatica, left side: Secondary | ICD-10-CM

## 2021-05-26 DIAGNOSIS — G5710 Meralgia paresthetica, unspecified lower limb: Secondary | ICD-10-CM

## 2021-05-26 DIAGNOSIS — Z8711 Personal history of peptic ulcer disease: Secondary | ICD-10-CM

## 2021-05-26 DIAGNOSIS — J9612 Chronic respiratory failure with hypercapnia: Secondary | ICD-10-CM | POA: Diagnosis not present

## 2021-05-26 DIAGNOSIS — I2510 Atherosclerotic heart disease of native coronary artery without angina pectoris: Secondary | ICD-10-CM

## 2021-05-26 DIAGNOSIS — Z86010 Personal history of colonic polyps: Secondary | ICD-10-CM

## 2021-05-26 DIAGNOSIS — M8580 Other specified disorders of bone density and structure, unspecified site: Secondary | ICD-10-CM

## 2021-05-26 DIAGNOSIS — G4733 Obstructive sleep apnea (adult) (pediatric): Secondary | ICD-10-CM

## 2021-05-26 DIAGNOSIS — Z86718 Personal history of other venous thrombosis and embolism: Secondary | ICD-10-CM

## 2021-05-26 DIAGNOSIS — Z6841 Body Mass Index (BMI) 40.0 and over, adult: Secondary | ICD-10-CM

## 2021-05-26 DIAGNOSIS — G609 Hereditary and idiopathic neuropathy, unspecified: Secondary | ICD-10-CM

## 2021-05-26 DIAGNOSIS — I2721 Secondary pulmonary arterial hypertension: Secondary | ICD-10-CM

## 2021-05-26 DIAGNOSIS — Z8616 Personal history of COVID-19: Secondary | ICD-10-CM

## 2021-05-26 DIAGNOSIS — G5600 Carpal tunnel syndrome, unspecified upper limb: Secondary | ICD-10-CM

## 2021-05-26 DIAGNOSIS — E1122 Type 2 diabetes mellitus with diabetic chronic kidney disease: Secondary | ICD-10-CM | POA: Diagnosis not present

## 2021-05-26 DIAGNOSIS — E662 Morbid (severe) obesity with alveolar hypoventilation: Secondary | ICD-10-CM

## 2021-05-26 DIAGNOSIS — R32 Unspecified urinary incontinence: Secondary | ICD-10-CM

## 2021-05-26 DIAGNOSIS — E785 Hyperlipidemia, unspecified: Secondary | ICD-10-CM

## 2021-05-26 DIAGNOSIS — Z8701 Personal history of pneumonia (recurrent): Secondary | ICD-10-CM

## 2021-05-26 DIAGNOSIS — D631 Anemia in chronic kidney disease: Secondary | ICD-10-CM | POA: Diagnosis not present

## 2021-05-26 DIAGNOSIS — Z7951 Long term (current) use of inhaled steroids: Secondary | ICD-10-CM

## 2021-05-26 DIAGNOSIS — N1832 Chronic kidney disease, stage 3b: Secondary | ICD-10-CM | POA: Diagnosis not present

## 2021-05-26 DIAGNOSIS — E1151 Type 2 diabetes mellitus with diabetic peripheral angiopathy without gangrene: Secondary | ICD-10-CM | POA: Diagnosis not present

## 2021-05-26 DIAGNOSIS — Z7901 Long term (current) use of anticoagulants: Secondary | ICD-10-CM

## 2021-05-26 DIAGNOSIS — I87301 Chronic venous hypertension (idiopathic) without complications of right lower extremity: Secondary | ICD-10-CM | POA: Diagnosis not present

## 2021-05-26 DIAGNOSIS — I890 Lymphedema, not elsewhere classified: Secondary | ICD-10-CM

## 2021-05-26 DIAGNOSIS — M170 Bilateral primary osteoarthritis of knee: Secondary | ICD-10-CM

## 2021-05-26 DIAGNOSIS — G8929 Other chronic pain: Secondary | ICD-10-CM

## 2021-05-26 DIAGNOSIS — K219 Gastro-esophageal reflux disease without esophagitis: Secondary | ICD-10-CM

## 2021-05-26 DIAGNOSIS — J9611 Chronic respiratory failure with hypoxia: Secondary | ICD-10-CM | POA: Diagnosis not present

## 2021-05-26 DIAGNOSIS — J45909 Unspecified asthma, uncomplicated: Secondary | ICD-10-CM

## 2021-05-26 DIAGNOSIS — I5033 Acute on chronic diastolic (congestive) heart failure: Secondary | ICD-10-CM | POA: Diagnosis not present

## 2021-05-26 DIAGNOSIS — M109 Gout, unspecified: Secondary | ICD-10-CM

## 2021-05-26 DIAGNOSIS — E538 Deficiency of other specified B group vitamins: Secondary | ICD-10-CM

## 2021-05-27 ENCOUNTER — Telehealth: Payer: Self-pay | Admitting: Physician Assistant

## 2021-05-27 DIAGNOSIS — J9611 Chronic respiratory failure with hypoxia: Secondary | ICD-10-CM | POA: Diagnosis not present

## 2021-05-27 DIAGNOSIS — E1122 Type 2 diabetes mellitus with diabetic chronic kidney disease: Secondary | ICD-10-CM | POA: Diagnosis not present

## 2021-05-27 DIAGNOSIS — N1832 Chronic kidney disease, stage 3b: Secondary | ICD-10-CM | POA: Diagnosis not present

## 2021-05-27 DIAGNOSIS — E1151 Type 2 diabetes mellitus with diabetic peripheral angiopathy without gangrene: Secondary | ICD-10-CM | POA: Diagnosis not present

## 2021-05-27 DIAGNOSIS — I130 Hypertensive heart and chronic kidney disease with heart failure and stage 1 through stage 4 chronic kidney disease, or unspecified chronic kidney disease: Secondary | ICD-10-CM | POA: Diagnosis not present

## 2021-05-27 DIAGNOSIS — J9612 Chronic respiratory failure with hypercapnia: Secondary | ICD-10-CM | POA: Diagnosis not present

## 2021-05-27 DIAGNOSIS — I5033 Acute on chronic diastolic (congestive) heart failure: Secondary | ICD-10-CM | POA: Diagnosis not present

## 2021-05-27 DIAGNOSIS — D631 Anemia in chronic kidney disease: Secondary | ICD-10-CM | POA: Diagnosis not present

## 2021-05-27 DIAGNOSIS — I87301 Chronic venous hypertension (idiopathic) without complications of right lower extremity: Secondary | ICD-10-CM | POA: Diagnosis not present

## 2021-05-27 NOTE — Telephone Encounter (Signed)
Called patient regarding upcoming appointments, patient is notified. 

## 2021-05-30 ENCOUNTER — Ambulatory Visit: Payer: Medicare PPO | Admitting: Internal Medicine

## 2021-05-30 DIAGNOSIS — J9611 Chronic respiratory failure with hypoxia: Secondary | ICD-10-CM | POA: Diagnosis not present

## 2021-05-30 DIAGNOSIS — J9612 Chronic respiratory failure with hypercapnia: Secondary | ICD-10-CM | POA: Diagnosis not present

## 2021-05-30 DIAGNOSIS — E1151 Type 2 diabetes mellitus with diabetic peripheral angiopathy without gangrene: Secondary | ICD-10-CM | POA: Diagnosis not present

## 2021-05-30 DIAGNOSIS — I87301 Chronic venous hypertension (idiopathic) without complications of right lower extremity: Secondary | ICD-10-CM | POA: Diagnosis not present

## 2021-05-30 DIAGNOSIS — N1832 Chronic kidney disease, stage 3b: Secondary | ICD-10-CM | POA: Diagnosis not present

## 2021-05-30 DIAGNOSIS — I5033 Acute on chronic diastolic (congestive) heart failure: Secondary | ICD-10-CM | POA: Diagnosis not present

## 2021-05-30 DIAGNOSIS — E1122 Type 2 diabetes mellitus with diabetic chronic kidney disease: Secondary | ICD-10-CM | POA: Diagnosis not present

## 2021-05-30 DIAGNOSIS — I130 Hypertensive heart and chronic kidney disease with heart failure and stage 1 through stage 4 chronic kidney disease, or unspecified chronic kidney disease: Secondary | ICD-10-CM | POA: Diagnosis not present

## 2021-05-30 DIAGNOSIS — D631 Anemia in chronic kidney disease: Secondary | ICD-10-CM | POA: Diagnosis not present

## 2021-06-02 DIAGNOSIS — I87301 Chronic venous hypertension (idiopathic) without complications of right lower extremity: Secondary | ICD-10-CM | POA: Diagnosis not present

## 2021-06-02 DIAGNOSIS — E1151 Type 2 diabetes mellitus with diabetic peripheral angiopathy without gangrene: Secondary | ICD-10-CM | POA: Diagnosis not present

## 2021-06-02 DIAGNOSIS — D631 Anemia in chronic kidney disease: Secondary | ICD-10-CM | POA: Diagnosis not present

## 2021-06-02 DIAGNOSIS — J9612 Chronic respiratory failure with hypercapnia: Secondary | ICD-10-CM | POA: Diagnosis not present

## 2021-06-02 DIAGNOSIS — E1122 Type 2 diabetes mellitus with diabetic chronic kidney disease: Secondary | ICD-10-CM | POA: Diagnosis not present

## 2021-06-02 DIAGNOSIS — N1832 Chronic kidney disease, stage 3b: Secondary | ICD-10-CM | POA: Diagnosis not present

## 2021-06-02 DIAGNOSIS — I130 Hypertensive heart and chronic kidney disease with heart failure and stage 1 through stage 4 chronic kidney disease, or unspecified chronic kidney disease: Secondary | ICD-10-CM | POA: Diagnosis not present

## 2021-06-02 DIAGNOSIS — J9611 Chronic respiratory failure with hypoxia: Secondary | ICD-10-CM | POA: Diagnosis not present

## 2021-06-02 DIAGNOSIS — I5033 Acute on chronic diastolic (congestive) heart failure: Secondary | ICD-10-CM | POA: Diagnosis not present

## 2021-06-02 NOTE — Telephone Encounter (Signed)
Spoke with patient, explained patient assitance application requirements for eliquis, have received patient out of pocket expenses for 2023 - still requires income verification and completion of PAP application  ? ?Pt will review income and OOP expenses, to reach out with any questions / if she would qualify for assistance to continue with application  ? ?Tomasa Blase, PharmD ?Clinical Pharmacist, [place]  ? ?

## 2021-06-03 ENCOUNTER — Telehealth: Payer: Self-pay | Admitting: Nurse Practitioner

## 2021-06-03 NOTE — Telephone Encounter (Signed)
Called [employer] and spoke with Lattie Haw regarding order for POC, she saw the order and the signature from the provider from 05/16/2021.  She advised that there is a 30 day turn around and she would reach out to the patient to let her know. ? ?Called and spoke with patient and advised her that [employer] A[employer] is working on the POC order and there is a 30 day turn around for the POC.  I also let hre know that they will be contacting her to let her know what is going on with the order.  She verbalized understanding.  Nothing further needed. ? ? ?

## 2021-06-06 DIAGNOSIS — I5032 Chronic diastolic (congestive) heart failure: Secondary | ICD-10-CM | POA: Diagnosis not present

## 2021-06-06 DIAGNOSIS — I5033 Acute on chronic diastolic (congestive) heart failure: Secondary | ICD-10-CM | POA: Diagnosis not present

## 2021-06-06 DIAGNOSIS — I130 Hypertensive heart and chronic kidney disease with heart failure and stage 1 through stage 4 chronic kidney disease, or unspecified chronic kidney disease: Secondary | ICD-10-CM | POA: Diagnosis not present

## 2021-06-06 DIAGNOSIS — I87301 Chronic venous hypertension (idiopathic) without complications of right lower extremity: Secondary | ICD-10-CM | POA: Diagnosis not present

## 2021-06-06 DIAGNOSIS — N1832 Chronic kidney disease, stage 3b: Secondary | ICD-10-CM | POA: Diagnosis not present

## 2021-06-06 DIAGNOSIS — I890 Lymphedema, not elsewhere classified: Secondary | ICD-10-CM | POA: Diagnosis not present

## 2021-06-06 DIAGNOSIS — N2581 Secondary hyperparathyroidism of renal origin: Secondary | ICD-10-CM | POA: Diagnosis not present

## 2021-06-06 DIAGNOSIS — I82409 Acute embolism and thrombosis of unspecified deep veins of unspecified lower extremity: Secondary | ICD-10-CM | POA: Diagnosis not present

## 2021-06-06 DIAGNOSIS — J9611 Chronic respiratory failure with hypoxia: Secondary | ICD-10-CM | POA: Diagnosis not present

## 2021-06-06 DIAGNOSIS — G4733 Obstructive sleep apnea (adult) (pediatric): Secondary | ICD-10-CM | POA: Diagnosis not present

## 2021-06-06 DIAGNOSIS — N189 Chronic kidney disease, unspecified: Secondary | ICD-10-CM | POA: Diagnosis not present

## 2021-06-06 DIAGNOSIS — E1122 Type 2 diabetes mellitus with diabetic chronic kidney disease: Secondary | ICD-10-CM | POA: Diagnosis not present

## 2021-06-06 DIAGNOSIS — E785 Hyperlipidemia, unspecified: Secondary | ICD-10-CM | POA: Diagnosis not present

## 2021-06-06 DIAGNOSIS — N1831 Chronic kidney disease, stage 3a: Secondary | ICD-10-CM | POA: Diagnosis not present

## 2021-06-06 DIAGNOSIS — D631 Anemia in chronic kidney disease: Secondary | ICD-10-CM | POA: Diagnosis not present

## 2021-06-06 DIAGNOSIS — J9612 Chronic respiratory failure with hypercapnia: Secondary | ICD-10-CM | POA: Diagnosis not present

## 2021-06-06 DIAGNOSIS — E1151 Type 2 diabetes mellitus with diabetic peripheral angiopathy without gangrene: Secondary | ICD-10-CM | POA: Diagnosis not present

## 2021-06-10 DIAGNOSIS — I130 Hypertensive heart and chronic kidney disease with heart failure and stage 1 through stage 4 chronic kidney disease, or unspecified chronic kidney disease: Secondary | ICD-10-CM | POA: Diagnosis not present

## 2021-06-10 DIAGNOSIS — J9611 Chronic respiratory failure with hypoxia: Secondary | ICD-10-CM | POA: Diagnosis not present

## 2021-06-10 DIAGNOSIS — N1832 Chronic kidney disease, stage 3b: Secondary | ICD-10-CM | POA: Diagnosis not present

## 2021-06-10 DIAGNOSIS — E1122 Type 2 diabetes mellitus with diabetic chronic kidney disease: Secondary | ICD-10-CM | POA: Diagnosis not present

## 2021-06-10 DIAGNOSIS — I5033 Acute on chronic diastolic (congestive) heart failure: Secondary | ICD-10-CM | POA: Diagnosis not present

## 2021-06-10 DIAGNOSIS — I87301 Chronic venous hypertension (idiopathic) without complications of right lower extremity: Secondary | ICD-10-CM | POA: Diagnosis not present

## 2021-06-10 DIAGNOSIS — E1151 Type 2 diabetes mellitus with diabetic peripheral angiopathy without gangrene: Secondary | ICD-10-CM | POA: Diagnosis not present

## 2021-06-10 DIAGNOSIS — J9612 Chronic respiratory failure with hypercapnia: Secondary | ICD-10-CM | POA: Diagnosis not present

## 2021-06-10 DIAGNOSIS — D631 Anemia in chronic kidney disease: Secondary | ICD-10-CM | POA: Diagnosis not present

## 2021-06-12 ENCOUNTER — Other Ambulatory Visit: Payer: Self-pay | Admitting: Physician Assistant

## 2021-06-12 DIAGNOSIS — Z86718 Personal history of other venous thrombosis and embolism: Secondary | ICD-10-CM

## 2021-06-13 ENCOUNTER — Inpatient Hospital Stay: Payer: Medicare PPO | Admitting: Physician Assistant

## 2021-06-13 ENCOUNTER — Inpatient Hospital Stay: Payer: Medicare PPO | Attending: Physician Assistant

## 2021-06-13 ENCOUNTER — Other Ambulatory Visit: Payer: Self-pay

## 2021-06-13 VITALS — BP 132/64 | HR 91 | Temp 97.7°F | Resp 19 | Ht 64.0 in

## 2021-06-13 DIAGNOSIS — Z86718 Personal history of other venous thrombosis and embolism: Secondary | ICD-10-CM

## 2021-06-13 DIAGNOSIS — D631 Anemia in chronic kidney disease: Secondary | ICD-10-CM | POA: Diagnosis not present

## 2021-06-13 DIAGNOSIS — E1122 Type 2 diabetes mellitus with diabetic chronic kidney disease: Secondary | ICD-10-CM | POA: Diagnosis not present

## 2021-06-13 DIAGNOSIS — I87301 Chronic venous hypertension (idiopathic) without complications of right lower extremity: Secondary | ICD-10-CM | POA: Diagnosis not present

## 2021-06-13 DIAGNOSIS — I130 Hypertensive heart and chronic kidney disease with heart failure and stage 1 through stage 4 chronic kidney disease, or unspecified chronic kidney disease: Secondary | ICD-10-CM | POA: Diagnosis not present

## 2021-06-13 DIAGNOSIS — J9612 Chronic respiratory failure with hypercapnia: Secondary | ICD-10-CM | POA: Diagnosis not present

## 2021-06-13 DIAGNOSIS — J9611 Chronic respiratory failure with hypoxia: Secondary | ICD-10-CM | POA: Diagnosis not present

## 2021-06-13 DIAGNOSIS — Z7901 Long term (current) use of anticoagulants: Secondary | ICD-10-CM | POA: Diagnosis not present

## 2021-06-13 DIAGNOSIS — N1832 Chronic kidney disease, stage 3b: Secondary | ICD-10-CM | POA: Diagnosis not present

## 2021-06-13 DIAGNOSIS — I5033 Acute on chronic diastolic (congestive) heart failure: Secondary | ICD-10-CM | POA: Diagnosis not present

## 2021-06-13 DIAGNOSIS — E1151 Type 2 diabetes mellitus with diabetic peripheral angiopathy without gangrene: Secondary | ICD-10-CM | POA: Diagnosis not present

## 2021-06-13 LAB — CMP (CANCER CENTER ONLY)
ALT: 14 U/L (ref 0–44)
AST: 16 U/L (ref 15–41)
Albumin: 3.7 g/dL (ref 3.5–5.0)
Alkaline Phosphatase: 91 U/L (ref 38–126)
Anion gap: 6 (ref 5–15)
BUN: 16 mg/dL (ref 8–23)
CO2: 34 mmol/L — ABNORMAL HIGH (ref 22–32)
Calcium: 9.5 mg/dL (ref 8.9–10.3)
Chloride: 100 mmol/L (ref 98–111)
Creatinine: 1.5 mg/dL — ABNORMAL HIGH (ref 0.44–1.00)
GFR, Estimated: 37 mL/min — ABNORMAL LOW (ref >60)
Glucose, Bld: 93 mg/dL (ref 70–99)
Potassium: 4.2 mmol/L (ref 3.5–5.1)
Sodium: 140 mmol/L (ref 135–145)
Total Bilirubin: 0.5 mg/dL (ref 0.3–1.2)
Total Protein: 6.9 g/dL (ref 6.5–8.1)

## 2021-06-13 LAB — CBC WITH DIFFERENTIAL (CANCER CENTER ONLY)
Abs Immature Granulocytes: 0.02 10*3/uL (ref 0.00–0.07)
Basophils Absolute: 0.1 10*3/uL (ref 0.0–0.1)
Basophils Relative: 1 %
Eosinophils Absolute: 0.1 10*3/uL (ref 0.0–0.5)
Eosinophils Relative: 2 %
HCT: 36.1 % (ref 36.0–46.0)
Hemoglobin: 11.3 g/dL — ABNORMAL LOW (ref 12.0–15.0)
Immature Granulocytes: 0 %
Lymphocytes Relative: 22 %
Lymphs Abs: 1.4 10*3/uL (ref 0.7–4.0)
MCH: 32.3 pg (ref 26.0–34.0)
MCHC: 31.3 g/dL (ref 30.0–36.0)
MCV: 103.1 fL — ABNORMAL HIGH (ref 80.0–100.0)
Monocytes Absolute: 0.5 10*3/uL (ref 0.1–1.0)
Monocytes Relative: 8 %
Neutro Abs: 4.3 10*3/uL (ref 1.7–7.7)
Neutrophils Relative %: 67 %
Platelet Count: 206 10*3/uL (ref 150–400)
RBC: 3.5 MIL/uL — ABNORMAL LOW (ref 3.87–5.11)
RDW: 17 % — ABNORMAL HIGH (ref 11.5–15.5)
WBC Count: 6.5 10*3/uL (ref 4.0–10.5)
nRBC: 0 % (ref 0.0–0.2)

## 2021-06-13 NOTE — Progress Notes (Signed)
?[place] ?[phone]   [phone] ? ?PROGRESS NOTE ? ?Patient Care Team: ?[name], MD as PCP - General (Internal Medicine) ? ?Hematological/Oncological History ?1) 05/08/2020: Presented with leg pain with difficulty walking x 10 days. Doppler US confirmed age indeterminate DVT involving right posterior tibial veins and left posterior tibial veins. CTA was negative for pulmonary embolism. Started on Eliquis.  ? ?2) 10/02/2020: Establish care with Dede Query PA-C ? ?CHIEF COMPLAINTS/PURPOSE OF CONSULTATION:  ?Bilateral lower extremity DVT ? ?HISTORY OF PRESENTING ILLNESS:  ?April [name] 71 y.o. female returns for follow-up for history of bilateral lower extremity DVT, currently on Eliquis.  Patient was last seen by me on 01/02/2021. In the interim, she has transitioned to maintenance dose of Eliquis.  ? ?On exam today, [name] reports that she is tolerating Eliquis without any significant issues.  She recently had a mechanical fall and that left bruising all over her body.  Since then, she decreased the dose of Eliquis to the maintenance dose of 2.5 mg twice daily.  She denies any signs of active bleeding.  Her energy and appetite are fairly stable.  She continues to have a sedentary lifestyle and uses a wheelchair for ambulation.  She denies any nausea, vomiting or abdominal pain.  Her bowel habits are unchanged without any recurrent episodes of diarrhea or constipation.  She has stable, chronic shortness of breath with exertion.  She denies any fevers, chills, night sweats, pain or cough.  She has no other complaints.  Rest of the 10 point ROS is below ? ?MEDICAL HISTORY:  ?Past Medical History:  ?Diagnosis Date  ? Anemia   ? Asthma   ? CAD (coronary artery disease)   ? Carpal tunnel syndrome   ? Cellulitis of both lower extremities 04/11/2015  ? CHF (congestive heart failure) ([place])   ? Chronic kidney disease   ? CKI- followed by [facility]  ? Colon polyp,  hyperplastic 2007 & 2012  ? Complication of anesthesia 1999  ? svt with renal calculi surgery, no problems since  ? CTS (carpal tunnel syndrome)   ? bilateral  ? Diabetes mellitus   ? Eczema   ? GERD (gastroesophageal reflux disease)   ? History of kidney stones 1999  ? Hyperlipidemia   ? Hypertension   ? Leg ulcer ([place]) 04/24/2015  ? Right lateral leg No evidence of an infection Monitor closely Keep edema controlled   ? Leg ulcer ([place])   ? right lower  ? Meralgia paresthetica   ? Dr. [name]  ? Morbid obesity ([place])   ? Neuropathy   ? toes and legs  ? Osteoarthrosis, unspecified whether generalized or localized, lower leg   ? knee  ? PUD (peptic ulcer disease)   ? Shortness of breath dyspnea   ? with exertion  ? Sleep apnea   ? per progress note 02/25/2018  ? Type II or unspecified type diabetes mellitus without mention of complication, not stated as uncontrolled   ? Unspecified hereditary and idiopathic peripheral neuropathy   ? Urticaria   ? Vitamin B12 deficiency   ? Wears glasses   ? Wound cellulitis   ? right upper leg, healing well  ? ? ?SURGICAL HISTORY: ?Past Surgical History:  ?Procedure Laterality Date  ? ABDOMINAL HYSTERECTOMY    ? BIOPSY  03/20/2021  ? Procedure: BIOPSY;  Surgeon: Jerene Bears, MD;  Location: [facility];  Service: Gastroenterology;;  ? BREAST BIOPSY Right 07/08/2018  ? CARDIAC CATHETERIZATION  2002  ? non obstructive disease  ? colonoscopy with polypectomy  2007 & 2012  ?  hyperplastic ;Dr Watt Climes  ? COLONOSCOPY WITH PROPOFOL N/A 06/04/2015  ? Procedure: COLONOSCOPY WITH PROPOFOL;  Surgeon: Jerene Bears, MD;  Location: [facility];  Service: Gastroenterology;  Laterality: N/A;  ? COLONOSCOPY WITH PROPOFOL N/A 03/20/2021  ? Procedure: COLONOSCOPY WITH PROPOFOL;  Surgeon: Jerene Bears, MD;  Location: [facility];  Service: Gastroenterology;  Laterality: N/A;  ? DEBRIDEMENT LEG Right 03/02/2018  ? WOUND VAC APPLIED  ? DEBRIDEMENT LEG Right 05/27/2018  ? RIGHT LOWER LEG DEBRIDEMENT,  SKIN  GRAFT, VAC PLACEMENT   ? DILATION AND CURETTAGE OF UTERUS    ? multiple  ? ESOPHAGOGASTRODUODENOSCOPY (EGD) WITH PROPOFOL N/A 03/20/2021  ? Procedure: ESOPHAGOGASTRODUODENOSCOPY (EGD) WITH PROPOFOL;  Surgeon: Jerene Bears, MD;  Location: [facility];  Service: Gastroenterology;  Laterality: N/A;  ? HEMORRHOID SURGERY    ? HEMORROIDECTOMY    ? I & D EXTREMITY Right 03/02/2018  ? Procedure: RIGHT LEG DEBRIDEMENT AND PLACE [place];  Surgeon: [name], MD;  Location: [facility];  Service: Orthopedics;  Laterality: Right;  ? I & D EXTREMITY Right 03/04/2018  ? Procedure: REPEAT IRRIGATION AND DEBRIDEMENT RIGHT LEG, PLACE WOUND VAC;  Surgeon: [name], MD;  Location: [place];  Service: Orthopedics;  Laterality: Right;  ? I & D EXTREMITY Right 03/30/2018  ? Procedure: IRRIGATION AND DEBRIDEMENT RIGHT LEG, APPLY WOUND VAC;  Surgeon: [name], MD;  Location: [place];  Service: Orthopedics;  Laterality: Right;  ? I & D EXTREMITY Right 05/27/2018  ? Procedure: RIGHT LOWER LEG DEBRIDEMENT,  SKIN GRAFT, VAC PLACEMENT;  Surgeon: [name], MD;  Location: [facility];  Service: Orthopedics;  Laterality: Right;  RIGHT LOWER LEG DEBRIDEMENT,  SKIN GRAFT, VAC PLACEMENT  ? POLYPECTOMY  03/20/2021  ? Procedure: POLYPECTOMY;  Surgeon: Jerene Bears, MD;  Location: [facility];  Service: Gastroenterology;;  ? renal calculi  12/1997  ? SVT with induction of anesthesia  ? RIGHT HEART CATH N/A 11/19/2020  ? Procedure: RIGHT HEART CATH;  Surgeon: Jolaine Artist, MD;  Location: [facility];  Service: Cardiovascular;  Laterality: N/A;  ? right knee arthroscopy    ? SKIN SPLIT GRAFT Right 03/04/2018  ? Procedure: POSSIBLE SPLIT THICKNESS SKIN GRAFT;  Surgeon: [name], MD;  Location: [place];  Service: Orthopedics;  Laterality: Right;  ? SKIN SPLIT GRAFT Right 04/01/2018  ? Procedure: REPEAT IRRIGATION AND DEBRIDEMENT RIGHT LEG, APPLY SPLIT THICKNESS SKIN GRAFT;  Surgeon: [name], MD;  Location: [place];  Service:  Orthopedics;  Laterality: Right;  ? TONSILLECTOMY AND ADENOIDECTOMY    ? ? ?SOCIAL HISTORY: ?Social History  ? ?Socioeconomic History  ? Marital status: Married  ?  Spouse name: [name]  ? Number of children: 1  ? Years of education: BS  ? Highest education level: Not on file  ?Occupational History  ? Occupation: Disabled  ?  Employer: RETIRED  ?Tobacco Use  ? Smoking status: Never  ? Smokeless tobacco: Never  ?Vaping Use  ? Vaping Use: Never used  ?Substance and Sexual Activity  ? Alcohol use: No  ?  Alcohol/week: 0.0 standard drinks  ? Drug use: No  ? Sexual activity: Not Currently  ?Other Topics Concern  ? Not on file  ?Social History Narrative  ? Patient lives at home with her husband [name] Patient is retired and has a Conservation officer, nature.   ?  Caffeine - some times.  ? Right handed.  ? ?Social Determinants of Health  ? ?Financial Resource Strain: Not on file  ?Food Insecurity: Not on file  ?Transportation Needs: Not on file  ?Physical Activity: Not on file  ?Stress: Not on file  ?Social Connections: Not on file  ?Intimate Partner Violence: Not on file  ? ? ?FAMILY HISTORY: ?Family History  ?Problem Relation Age of Onset  ? Colon cancer Mother   ? Prostate cancer Father   ? Colon cancer Father   ? Diabetes Maternal Aunt   ? Breast cancer Maternal Aunt   ? Diabetes Maternal Uncle   ? Diabetes Paternal Aunt   ? Stroke Paternal Aunt   ?     > 65  ? Heart disease Paternal Aunt   ? Diabetes Paternal Uncle   ? Breast cancer Maternal Aunt   ?      X 2  ? Breast cancer Cousin   ? ? ?ALLERGIES:  is allergic to penicillins, shellfish allergy, sulfonamide derivatives, hydrochlorothiazide w-triamterene, lotensin [benazepril hcl], dipyridamole, estrogens, hydrochlorothiazide, latex, metronidazole, other, spironolactone, torsemide, valsartan, and mustard [allyl isothiocyanate]. ? ?MEDICATIONS:  ?Current Outpatient Medications  ?Medication Sig Dispense Refill  ? acetaminophen (TYLENOL) 650 MG CR tablet Take 1,300 mg by  mouth every 8 (eight) hours as needed for pain.    ? Alcohol Swabs (DROPSAFE ALCOHOL PREP) 70 % PADS USE TOPICALLY AS DIRECTED 300 each 3  ? allopurinol (ZYLOPRIM) 100 MG tablet Take 2 tablets (200 mg total) by

## 2021-06-16 ENCOUNTER — Telehealth: Payer: Self-pay

## 2021-06-16 NOTE — Telephone Encounter (Signed)
CMN Resubmitted due to system error ?

## 2021-06-18 DIAGNOSIS — I5033 Acute on chronic diastolic (congestive) heart failure: Secondary | ICD-10-CM | POA: Diagnosis not present

## 2021-06-18 DIAGNOSIS — D631 Anemia in chronic kidney disease: Secondary | ICD-10-CM | POA: Diagnosis not present

## 2021-06-18 DIAGNOSIS — J9611 Chronic respiratory failure with hypoxia: Secondary | ICD-10-CM | POA: Diagnosis not present

## 2021-06-18 DIAGNOSIS — E1151 Type 2 diabetes mellitus with diabetic peripheral angiopathy without gangrene: Secondary | ICD-10-CM | POA: Diagnosis not present

## 2021-06-18 DIAGNOSIS — I87301 Chronic venous hypertension (idiopathic) without complications of right lower extremity: Secondary | ICD-10-CM | POA: Diagnosis not present

## 2021-06-18 DIAGNOSIS — N1832 Chronic kidney disease, stage 3b: Secondary | ICD-10-CM | POA: Diagnosis not present

## 2021-06-18 DIAGNOSIS — E1122 Type 2 diabetes mellitus with diabetic chronic kidney disease: Secondary | ICD-10-CM | POA: Diagnosis not present

## 2021-06-18 DIAGNOSIS — J9612 Chronic respiratory failure with hypercapnia: Secondary | ICD-10-CM | POA: Diagnosis not present

## 2021-06-18 DIAGNOSIS — I130 Hypertensive heart and chronic kidney disease with heart failure and stage 1 through stage 4 chronic kidney disease, or unspecified chronic kidney disease: Secondary | ICD-10-CM | POA: Diagnosis not present

## 2021-06-19 ENCOUNTER — Telehealth: Payer: Self-pay | Admitting: Internal Medicine

## 2021-06-19 NOTE — Telephone Encounter (Signed)
Patient is calling about referral to [facility] - patient says it has not been received at [facility].  - Please resend

## 2021-06-20 ENCOUNTER — Telehealth: Payer: Self-pay

## 2021-06-20 ENCOUNTER — Other Ambulatory Visit: Payer: Self-pay | Admitting: Internal Medicine

## 2021-06-20 ENCOUNTER — Ambulatory Visit (INDEPENDENT_AMBULATORY_CARE_PROVIDER_SITE_OTHER): Payer: Medicare PPO

## 2021-06-20 DIAGNOSIS — I130 Hypertensive heart and chronic kidney disease with heart failure and stage 1 through stage 4 chronic kidney disease, or unspecified chronic kidney disease: Secondary | ICD-10-CM | POA: Diagnosis not present

## 2021-06-20 DIAGNOSIS — I87301 Chronic venous hypertension (idiopathic) without complications of right lower extremity: Secondary | ICD-10-CM | POA: Diagnosis not present

## 2021-06-20 DIAGNOSIS — N1832 Chronic kidney disease, stage 3b: Secondary | ICD-10-CM | POA: Diagnosis not present

## 2021-06-20 DIAGNOSIS — I5033 Acute on chronic diastolic (congestive) heart failure: Secondary | ICD-10-CM | POA: Diagnosis not present

## 2021-06-20 DIAGNOSIS — D631 Anemia in chronic kidney disease: Secondary | ICD-10-CM | POA: Diagnosis not present

## 2021-06-20 DIAGNOSIS — E1122 Type 2 diabetes mellitus with diabetic chronic kidney disease: Secondary | ICD-10-CM | POA: Diagnosis not present

## 2021-06-20 DIAGNOSIS — E1151 Type 2 diabetes mellitus with diabetic peripheral angiopathy without gangrene: Secondary | ICD-10-CM | POA: Diagnosis not present

## 2021-06-20 DIAGNOSIS — J9611 Chronic respiratory failure with hypoxia: Secondary | ICD-10-CM | POA: Diagnosis not present

## 2021-06-20 DIAGNOSIS — J9612 Chronic respiratory failure with hypercapnia: Secondary | ICD-10-CM | POA: Diagnosis not present

## 2021-06-20 DIAGNOSIS — E538 Deficiency of other specified B group vitamins: Secondary | ICD-10-CM | POA: Diagnosis not present

## 2021-06-20 MED ORDER — CYANOCOBALAMIN 1000 MCG/ML IJ SOLN
1000.0000 ug | Freq: Once | INTRAMUSCULAR | Status: AC
  Administered 2021-06-20: 1000 ug via INTRAMUSCULAR

## 2021-06-20 NOTE — Telephone Encounter (Signed)
Spoke with patient today. 

## 2021-06-20 NOTE — Progress Notes (Signed)
This nurse administered vitamin B12 1000 mcg/ml per PCP order to right deltoid the patient tolerated well w/o complaints of discomfort.

## 2021-06-20 NOTE — Telephone Encounter (Signed)
Pt is requesting a refill: tirzepatide Darcel Bayley) 7.5 MG/0.5ML Pen  Pharmacy: [facility] [place], [place] AT [address] 05/16/21 ROV 08/29/21  Pt also wanted to know if Angela Nevin has heard about the Order for Diabetic shoes  Please advise

## 2021-06-24 DIAGNOSIS — D631 Anemia in chronic kidney disease: Secondary | ICD-10-CM | POA: Diagnosis not present

## 2021-06-24 DIAGNOSIS — I5033 Acute on chronic diastolic (congestive) heart failure: Secondary | ICD-10-CM | POA: Diagnosis not present

## 2021-06-24 DIAGNOSIS — J9612 Chronic respiratory failure with hypercapnia: Secondary | ICD-10-CM | POA: Diagnosis not present

## 2021-06-24 DIAGNOSIS — I87301 Chronic venous hypertension (idiopathic) without complications of right lower extremity: Secondary | ICD-10-CM | POA: Diagnosis not present

## 2021-06-24 DIAGNOSIS — E1151 Type 2 diabetes mellitus with diabetic peripheral angiopathy without gangrene: Secondary | ICD-10-CM | POA: Diagnosis not present

## 2021-06-24 DIAGNOSIS — I130 Hypertensive heart and chronic kidney disease with heart failure and stage 1 through stage 4 chronic kidney disease, or unspecified chronic kidney disease: Secondary | ICD-10-CM | POA: Diagnosis not present

## 2021-06-24 DIAGNOSIS — E1122 Type 2 diabetes mellitus with diabetic chronic kidney disease: Secondary | ICD-10-CM | POA: Diagnosis not present

## 2021-06-24 DIAGNOSIS — N1832 Chronic kidney disease, stage 3b: Secondary | ICD-10-CM | POA: Diagnosis not present

## 2021-06-24 DIAGNOSIS — J9611 Chronic respiratory failure with hypoxia: Secondary | ICD-10-CM | POA: Diagnosis not present

## 2021-06-24 NOTE — Telephone Encounter (Signed)
Paperwork faxed today.

## 2021-06-24 NOTE — Telephone Encounter (Signed)
Form for [name] received today.  Awaiting signature so form can be faxed back to [facility].

## 2021-06-25 DIAGNOSIS — E1122 Type 2 diabetes mellitus with diabetic chronic kidney disease: Secondary | ICD-10-CM | POA: Diagnosis not present

## 2021-06-25 DIAGNOSIS — I130 Hypertensive heart and chronic kidney disease with heart failure and stage 1 through stage 4 chronic kidney disease, or unspecified chronic kidney disease: Secondary | ICD-10-CM | POA: Diagnosis not present

## 2021-06-25 DIAGNOSIS — J9611 Chronic respiratory failure with hypoxia: Secondary | ICD-10-CM | POA: Diagnosis not present

## 2021-06-25 DIAGNOSIS — N1832 Chronic kidney disease, stage 3b: Secondary | ICD-10-CM | POA: Diagnosis not present

## 2021-06-25 DIAGNOSIS — D631 Anemia in chronic kidney disease: Secondary | ICD-10-CM | POA: Diagnosis not present

## 2021-06-25 DIAGNOSIS — I87301 Chronic venous hypertension (idiopathic) without complications of right lower extremity: Secondary | ICD-10-CM | POA: Diagnosis not present

## 2021-06-25 DIAGNOSIS — E1151 Type 2 diabetes mellitus with diabetic peripheral angiopathy without gangrene: Secondary | ICD-10-CM | POA: Diagnosis not present

## 2021-06-25 DIAGNOSIS — I5033 Acute on chronic diastolic (congestive) heart failure: Secondary | ICD-10-CM | POA: Diagnosis not present

## 2021-06-25 DIAGNOSIS — J9612 Chronic respiratory failure with hypercapnia: Secondary | ICD-10-CM | POA: Diagnosis not present

## 2021-06-26 ENCOUNTER — Telehealth: Payer: Self-pay

## 2021-06-26 NOTE — Telephone Encounter (Signed)
Patient called and stated that she would like CMN resent

## 2021-06-26 NOTE — Telephone Encounter (Signed)
Returned patient's call letting her know the CMN was resent on the 15th and that if they want to have a paper copy to take to her doctors office we can dispense one at their convenience. Patient was satisfied. All questions answered and concerns addressed.

## 2021-06-27 DIAGNOSIS — J9611 Chronic respiratory failure with hypoxia: Secondary | ICD-10-CM | POA: Diagnosis not present

## 2021-06-27 DIAGNOSIS — I87301 Chronic venous hypertension (idiopathic) without complications of right lower extremity: Secondary | ICD-10-CM | POA: Diagnosis not present

## 2021-06-27 DIAGNOSIS — I130 Hypertensive heart and chronic kidney disease with heart failure and stage 1 through stage 4 chronic kidney disease, or unspecified chronic kidney disease: Secondary | ICD-10-CM | POA: Diagnosis not present

## 2021-06-27 DIAGNOSIS — N1832 Chronic kidney disease, stage 3b: Secondary | ICD-10-CM | POA: Diagnosis not present

## 2021-06-27 DIAGNOSIS — D631 Anemia in chronic kidney disease: Secondary | ICD-10-CM | POA: Diagnosis not present

## 2021-06-27 DIAGNOSIS — E1151 Type 2 diabetes mellitus with diabetic peripheral angiopathy without gangrene: Secondary | ICD-10-CM | POA: Diagnosis not present

## 2021-06-27 DIAGNOSIS — I5033 Acute on chronic diastolic (congestive) heart failure: Secondary | ICD-10-CM | POA: Diagnosis not present

## 2021-06-27 DIAGNOSIS — J9612 Chronic respiratory failure with hypercapnia: Secondary | ICD-10-CM | POA: Diagnosis not present

## 2021-06-27 DIAGNOSIS — E1122 Type 2 diabetes mellitus with diabetic chronic kidney disease: Secondary | ICD-10-CM | POA: Diagnosis not present

## 2021-06-27 MED ORDER — HYDROCODONE BIT-HOMATROP MBR 5-1.5 MG/5ML PO SOLN: 5.0000 mL | Freq: Three times a day (TID) | ORAL | 0 refills | Status: DC | PRN

## 2021-06-27 NOTE — Telephone Encounter (Signed)
Patient would like to get her cough syrup refilled - [facility] [address]

## 2021-07-02 ENCOUNTER — Telehealth: Payer: Self-pay | Admitting: Internal Medicine

## 2021-07-02 NOTE — Telephone Encounter (Signed)
Patient would like to talk to you concerning her form for the foot doctor for her diabetic shoes.

## 2021-07-03 NOTE — Telephone Encounter (Signed)
Spoke with patient today. 

## 2021-07-04 DIAGNOSIS — D631 Anemia in chronic kidney disease: Secondary | ICD-10-CM | POA: Diagnosis not present

## 2021-07-04 DIAGNOSIS — J9612 Chronic respiratory failure with hypercapnia: Secondary | ICD-10-CM | POA: Diagnosis not present

## 2021-07-04 DIAGNOSIS — E1151 Type 2 diabetes mellitus with diabetic peripheral angiopathy without gangrene: Secondary | ICD-10-CM | POA: Diagnosis not present

## 2021-07-04 DIAGNOSIS — J9611 Chronic respiratory failure with hypoxia: Secondary | ICD-10-CM | POA: Diagnosis not present

## 2021-07-04 DIAGNOSIS — E1122 Type 2 diabetes mellitus with diabetic chronic kidney disease: Secondary | ICD-10-CM | POA: Diagnosis not present

## 2021-07-04 DIAGNOSIS — I5033 Acute on chronic diastolic (congestive) heart failure: Secondary | ICD-10-CM | POA: Diagnosis not present

## 2021-07-04 DIAGNOSIS — I130 Hypertensive heart and chronic kidney disease with heart failure and stage 1 through stage 4 chronic kidney disease, or unspecified chronic kidney disease: Secondary | ICD-10-CM | POA: Diagnosis not present

## 2021-07-04 DIAGNOSIS — I87301 Chronic venous hypertension (idiopathic) without complications of right lower extremity: Secondary | ICD-10-CM | POA: Diagnosis not present

## 2021-07-04 DIAGNOSIS — N1832 Chronic kidney disease, stage 3b: Secondary | ICD-10-CM | POA: Diagnosis not present

## 2021-07-07 ENCOUNTER — Telehealth: Payer: Self-pay

## 2021-07-07 NOTE — Telephone Encounter (Signed)
CMN received - Shoes ordered and casts released  [id number]

## 2021-07-08 DIAGNOSIS — I87301 Chronic venous hypertension (idiopathic) without complications of right lower extremity: Secondary | ICD-10-CM | POA: Diagnosis not present

## 2021-07-08 DIAGNOSIS — J9611 Chronic respiratory failure with hypoxia: Secondary | ICD-10-CM | POA: Diagnosis not present

## 2021-07-08 DIAGNOSIS — J9612 Chronic respiratory failure with hypercapnia: Secondary | ICD-10-CM | POA: Diagnosis not present

## 2021-07-08 DIAGNOSIS — N1832 Chronic kidney disease, stage 3b: Secondary | ICD-10-CM | POA: Diagnosis not present

## 2021-07-08 DIAGNOSIS — D631 Anemia in chronic kidney disease: Secondary | ICD-10-CM | POA: Diagnosis not present

## 2021-07-08 DIAGNOSIS — I5033 Acute on chronic diastolic (congestive) heart failure: Secondary | ICD-10-CM | POA: Diagnosis not present

## 2021-07-08 DIAGNOSIS — E1151 Type 2 diabetes mellitus with diabetic peripheral angiopathy without gangrene: Secondary | ICD-10-CM | POA: Diagnosis not present

## 2021-07-08 DIAGNOSIS — E1122 Type 2 diabetes mellitus with diabetic chronic kidney disease: Secondary | ICD-10-CM | POA: Diagnosis not present

## 2021-07-08 DIAGNOSIS — I130 Hypertensive heart and chronic kidney disease with heart failure and stage 1 through stage 4 chronic kidney disease, or unspecified chronic kidney disease: Secondary | ICD-10-CM | POA: Diagnosis not present

## 2021-07-11 ENCOUNTER — Other Ambulatory Visit: Payer: Medicare PPO

## 2021-07-11 ENCOUNTER — Ambulatory Visit: Payer: Medicare PPO | Admitting: Podiatry

## 2021-07-11 DIAGNOSIS — M79675 Pain in left toe(s): Secondary | ICD-10-CM | POA: Diagnosis not present

## 2021-07-11 DIAGNOSIS — B351 Tinea unguium: Secondary | ICD-10-CM | POA: Diagnosis not present

## 2021-07-11 DIAGNOSIS — Z7901 Long term (current) use of anticoagulants: Secondary | ICD-10-CM | POA: Diagnosis not present

## 2021-07-11 DIAGNOSIS — M79674 Pain in right toe(s): Secondary | ICD-10-CM | POA: Diagnosis not present

## 2021-07-11 DIAGNOSIS — E1142 Type 2 diabetes mellitus with diabetic polyneuropathy: Secondary | ICD-10-CM | POA: Diagnosis not present

## 2021-07-13 NOTE — Progress Notes (Signed)
Subjective: 71 y.o. returns the office today for painful, elongated, thickened toenails which she cannot trim herself. Denies any redness or drainage around the nails. Denies any acute changes since last appointment and no new complaints today. Denies any systemic complaints such as fevers, chills, nausea, vomiting.  On Eliquis   PCP: [name], MD Last Seen: 05/16/2021  A1c: 6.4 on 11/29/2020  Objective: AAO 3, NAD- in wheelchair DP/PT pulses decreased Chronic lower extremity edema present.  Sensation decreased as well as to monofilament. Nails hypertrophic, dystrophic, elongated, brittle, discolored 10. There is tenderness overlying the nails 1-5 bilaterally. There is no surrounding erythema or drainage along the nail sites. No open lesions or pre-ulcerative lesions are identified. No other areas of tenderness bilateral lower extremities. No overlying edema, erythema, increased warmth. No pain with calf compression, swelling, warmth, erythema.  Assessment: Patient presents with symptomatic onychomycosis  Plan: -Treatment options including alternatives, risks, complications were discussed -Nails sharply debrided 10 without complication/bleeding. -Discussed daily foot inspection. If there are any changes, to call the office immediately.  -Follow-up in 3 months or sooner if any problems are to arise. In the meantime, encouraged to call the office with any questions, concerns, changes symptoms.  Celesta Gentile, DPM

## 2021-07-14 ENCOUNTER — Telehealth: Payer: Self-pay

## 2021-07-14 MED ORDER — TIRZEPATIDE 10 MG/0.5ML ~~LOC~~ SOAJ: 10.0000 mg | SUBCUTANEOUS | 0 refills | Status: DC

## 2021-07-14 NOTE — Telephone Encounter (Signed)
Pt is requesting a refill on: MOUNJARO 7.5 MG/0.5ML Pen  Pt is asking for a dose increase.  Pharmacy: [facility] [place], [address] AT [address] 05/16/21 ROV 08/29/21

## 2021-07-15 ENCOUNTER — Telehealth: Payer: Self-pay | Admitting: Internal Medicine

## 2021-07-15 NOTE — Telephone Encounter (Signed)
Pt called in and states she is having troble getting her Mounjaro medication.   States she has called various pharmacies and the medication is on back stock.   Pt. Requesting alternative med.

## 2021-07-16 NOTE — Telephone Encounter (Signed)
Did she try [facility] pharmacies?   We may need to decrease the dose as well.  Of course there are other options but nothing that we will work this well.  We can consider Ozempic, but there may also be shortages of this so we may run into the same problem.  That would be the only possible alternative.

## 2021-07-17 ENCOUNTER — Other Ambulatory Visit: Payer: Self-pay

## 2021-07-17 ENCOUNTER — Other Ambulatory Visit (HOSPITAL_COMMUNITY): Payer: Self-pay

## 2021-07-17 DIAGNOSIS — I5033 Acute on chronic diastolic (congestive) heart failure: Secondary | ICD-10-CM | POA: Diagnosis not present

## 2021-07-17 DIAGNOSIS — E1122 Type 2 diabetes mellitus with diabetic chronic kidney disease: Secondary | ICD-10-CM | POA: Diagnosis not present

## 2021-07-17 DIAGNOSIS — N1832 Chronic kidney disease, stage 3b: Secondary | ICD-10-CM | POA: Diagnosis not present

## 2021-07-17 DIAGNOSIS — D631 Anemia in chronic kidney disease: Secondary | ICD-10-CM | POA: Diagnosis not present

## 2021-07-17 DIAGNOSIS — I130 Hypertensive heart and chronic kidney disease with heart failure and stage 1 through stage 4 chronic kidney disease, or unspecified chronic kidney disease: Secondary | ICD-10-CM | POA: Diagnosis not present

## 2021-07-17 DIAGNOSIS — J9611 Chronic respiratory failure with hypoxia: Secondary | ICD-10-CM | POA: Diagnosis not present

## 2021-07-17 DIAGNOSIS — J9612 Chronic respiratory failure with hypercapnia: Secondary | ICD-10-CM | POA: Diagnosis not present

## 2021-07-17 DIAGNOSIS — E1151 Type 2 diabetes mellitus with diabetic peripheral angiopathy without gangrene: Secondary | ICD-10-CM | POA: Diagnosis not present

## 2021-07-17 DIAGNOSIS — I87301 Chronic venous hypertension (idiopathic) without complications of right lower extremity: Secondary | ICD-10-CM | POA: Diagnosis not present

## 2021-07-17 MED ORDER — TIRZEPATIDE 10 MG/0.5ML ~~LOC~~ SOAJ: 10.0000 mg | SUBCUTANEOUS | 0 refills | Status: DC

## 2021-07-17 NOTE — Telephone Encounter (Signed)
Spoke with patient and refill sent to [facility] for the 10 ml since she said they had it in stock and called her.

## 2021-07-17 NOTE — Telephone Encounter (Signed)
Spoke with patient today.  She will call [facility] pharmacy and let me know if they carry it or not.

## 2021-07-17 NOTE — Telephone Encounter (Signed)
Pt.called in for a follow-up regarding this message.   Please advise

## 2021-07-17 NOTE — Telephone Encounter (Signed)
Pt returned call call to state that pharmacy has '10mg'$  of [facility]

## 2021-07-18 DIAGNOSIS — N1832 Chronic kidney disease, stage 3b: Secondary | ICD-10-CM | POA: Diagnosis not present

## 2021-07-18 DIAGNOSIS — I130 Hypertensive heart and chronic kidney disease with heart failure and stage 1 through stage 4 chronic kidney disease, or unspecified chronic kidney disease: Secondary | ICD-10-CM | POA: Diagnosis not present

## 2021-07-18 DIAGNOSIS — E1122 Type 2 diabetes mellitus with diabetic chronic kidney disease: Secondary | ICD-10-CM | POA: Diagnosis not present

## 2021-07-18 DIAGNOSIS — J9612 Chronic respiratory failure with hypercapnia: Secondary | ICD-10-CM | POA: Diagnosis not present

## 2021-07-18 DIAGNOSIS — I87301 Chronic venous hypertension (idiopathic) without complications of right lower extremity: Secondary | ICD-10-CM | POA: Diagnosis not present

## 2021-07-18 DIAGNOSIS — I5033 Acute on chronic diastolic (congestive) heart failure: Secondary | ICD-10-CM | POA: Diagnosis not present

## 2021-07-18 DIAGNOSIS — D631 Anemia in chronic kidney disease: Secondary | ICD-10-CM | POA: Diagnosis not present

## 2021-07-18 DIAGNOSIS — E1151 Type 2 diabetes mellitus with diabetic peripheral angiopathy without gangrene: Secondary | ICD-10-CM | POA: Diagnosis not present

## 2021-07-18 DIAGNOSIS — J9611 Chronic respiratory failure with hypoxia: Secondary | ICD-10-CM | POA: Diagnosis not present

## 2021-07-21 DIAGNOSIS — I5033 Acute on chronic diastolic (congestive) heart failure: Secondary | ICD-10-CM | POA: Diagnosis not present

## 2021-07-21 DIAGNOSIS — N1832 Chronic kidney disease, stage 3b: Secondary | ICD-10-CM | POA: Diagnosis not present

## 2021-07-21 DIAGNOSIS — J9612 Chronic respiratory failure with hypercapnia: Secondary | ICD-10-CM | POA: Diagnosis not present

## 2021-07-21 DIAGNOSIS — I87301 Chronic venous hypertension (idiopathic) without complications of right lower extremity: Secondary | ICD-10-CM | POA: Diagnosis not present

## 2021-07-21 DIAGNOSIS — I130 Hypertensive heart and chronic kidney disease with heart failure and stage 1 through stage 4 chronic kidney disease, or unspecified chronic kidney disease: Secondary | ICD-10-CM | POA: Diagnosis not present

## 2021-07-21 DIAGNOSIS — D631 Anemia in chronic kidney disease: Secondary | ICD-10-CM | POA: Diagnosis not present

## 2021-07-21 DIAGNOSIS — E1151 Type 2 diabetes mellitus with diabetic peripheral angiopathy without gangrene: Secondary | ICD-10-CM | POA: Diagnosis not present

## 2021-07-21 DIAGNOSIS — E1122 Type 2 diabetes mellitus with diabetic chronic kidney disease: Secondary | ICD-10-CM | POA: Diagnosis not present

## 2021-07-21 DIAGNOSIS — J9611 Chronic respiratory failure with hypoxia: Secondary | ICD-10-CM | POA: Diagnosis not present

## 2021-07-22 ENCOUNTER — Telehealth: Payer: Self-pay | Admitting: Nurse Practitioner

## 2021-07-22 ENCOUNTER — Telehealth: Payer: Self-pay | Admitting: Internal Medicine

## 2021-07-22 DIAGNOSIS — I130 Hypertensive heart and chronic kidney disease with heart failure and stage 1 through stage 4 chronic kidney disease, or unspecified chronic kidney disease: Secondary | ICD-10-CM | POA: Diagnosis not present

## 2021-07-22 DIAGNOSIS — E1151 Type 2 diabetes mellitus with diabetic peripheral angiopathy without gangrene: Secondary | ICD-10-CM | POA: Diagnosis not present

## 2021-07-22 DIAGNOSIS — J9611 Chronic respiratory failure with hypoxia: Secondary | ICD-10-CM | POA: Diagnosis not present

## 2021-07-22 DIAGNOSIS — I5033 Acute on chronic diastolic (congestive) heart failure: Secondary | ICD-10-CM | POA: Diagnosis not present

## 2021-07-22 DIAGNOSIS — D631 Anemia in chronic kidney disease: Secondary | ICD-10-CM | POA: Diagnosis not present

## 2021-07-22 DIAGNOSIS — J9612 Chronic respiratory failure with hypercapnia: Secondary | ICD-10-CM | POA: Diagnosis not present

## 2021-07-22 DIAGNOSIS — I87301 Chronic venous hypertension (idiopathic) without complications of right lower extremity: Secondary | ICD-10-CM | POA: Diagnosis not present

## 2021-07-22 DIAGNOSIS — E1122 Type 2 diabetes mellitus with diabetic chronic kidney disease: Secondary | ICD-10-CM | POA: Diagnosis not present

## 2021-07-22 DIAGNOSIS — N1832 Chronic kidney disease, stage 3b: Secondary | ICD-10-CM | POA: Diagnosis not present

## 2021-07-22 NOTE — Telephone Encounter (Signed)
Spoke with Gennaro Africa and gave her your ok for the verbal orders.

## 2021-07-22 NOTE — Telephone Encounter (Signed)
Physical Therapist requesting Verbal Orders for Drain for pt once a week for 8 weeks. Also requesting [place] once a week for 8 weeks.   [phone]

## 2021-07-24 NOTE — Telephone Encounter (Signed)
Called [employer] this morning and spoke to [place] and she stated that they did have the order for the POC from Christos Mixson from [facility] NP. But now there is a 30-90 days turn around for the POC. I asked if they have contacted the patient regarding this information.   [employer] stated they will call and give patient an update on the POC device. I told  Minna Merritts that I would feel better if the patient heard from them what the hold up is at the moment. Nothing further needed

## 2021-07-25 ENCOUNTER — Ambulatory Visit: Payer: Medicare PPO

## 2021-07-25 DIAGNOSIS — M1712 Unilateral primary osteoarthritis, left knee: Secondary | ICD-10-CM | POA: Insufficient documentation

## 2021-08-01 ENCOUNTER — Ambulatory Visit: Payer: Medicare PPO

## 2021-08-01 ENCOUNTER — Ambulatory Visit
Admission: RE | Admit: 2021-08-01 | Discharge: 2021-08-01 | Disposition: A | Discharge status: Home / Self Care | Payer: Medicare PPO | Source: Ambulatory Visit | Attending: Internal Medicine | Admitting: Internal Medicine

## 2021-08-01 ENCOUNTER — Ambulatory Visit (INDEPENDENT_AMBULATORY_CARE_PROVIDER_SITE_OTHER): Payer: Medicare PPO

## 2021-08-01 DIAGNOSIS — Z1231 Encounter for screening mammogram for malignant neoplasm of breast: Secondary | ICD-10-CM | POA: Diagnosis not present

## 2021-08-01 DIAGNOSIS — M810 Age-related osteoporosis without current pathological fracture: Secondary | ICD-10-CM | POA: Diagnosis not present

## 2021-08-01 DIAGNOSIS — M8588 Other specified disorders of bone density and structure, other site: Secondary | ICD-10-CM

## 2021-08-01 DIAGNOSIS — Z780 Asymptomatic menopausal state: Secondary | ICD-10-CM | POA: Diagnosis not present

## 2021-08-01 DIAGNOSIS — E538 Deficiency of other specified B group vitamins: Secondary | ICD-10-CM

## 2021-08-01 MED ORDER — CYANOCOBALAMIN 1000 MCG/ML IJ SOLN
1000.0000 ug | Freq: Once | INTRAMUSCULAR | Status: AC
  Administered 2021-08-01: 1000 ug via INTRAMUSCULAR

## 2021-08-01 NOTE — Progress Notes (Signed)
B12 given Please cosign 

## 2021-08-02 ENCOUNTER — Other Ambulatory Visit: Payer: Self-pay | Admitting: Internal Medicine

## 2021-08-04 ENCOUNTER — Encounter: Payer: Self-pay | Admitting: Internal Medicine

## 2021-08-04 ENCOUNTER — Telehealth: Payer: Self-pay

## 2021-08-04 ENCOUNTER — Telehealth: Payer: Self-pay | Admitting: Nurse Practitioner

## 2021-08-04 NOTE — Telephone Encounter (Signed)
Pt is requesting refills on: nebivolol (BYSTOLIC) 5 MG tablet gabapentin (NEURONTIN) 100 MG capsule allopurinol (ZYLOPRIM) 100 MG tablet  Pharmacy: [facility] [place], [address] AT [address] 05/16/21 ROV 08/29/21

## 2021-08-04 NOTE — Telephone Encounter (Signed)
Called and spoke with patient. She stated that she had not heard anything in regards to her POC order. She had called a few days ago but never heard back from our office. I confirmed that the order was placed back in April 2023 and that we had received confirmation that [employer] has the order. Unfortunately there is a 3 month wait for a device. She stated that she was never made aware of this wait.   Called [employer] and the rep confirmed that they have the order for the POC. They will have someone from their [place] reach out to the patient for the next steps.   Called patient back to make her aware. She verbalized understanding.   Nothing further needed at time of call.

## 2021-08-04 NOTE — Telephone Encounter (Signed)
Prescriptions has been sent to requested pharmacy

## 2021-08-07 DIAGNOSIS — M1711 Unilateral primary osteoarthritis, right knee: Secondary | ICD-10-CM | POA: Diagnosis not present

## 2021-08-11 ENCOUNTER — Other Ambulatory Visit: Payer: Self-pay

## 2021-08-11 ENCOUNTER — Other Ambulatory Visit: Payer: Self-pay | Admitting: Internal Medicine

## 2021-08-11 MED ORDER — TIRZEPATIDE 10 MG/0.5ML ~~LOC~~ SOAJ: 10.0000 mg | SUBCUTANEOUS | 0 refills | Status: DC

## 2021-08-11 MED ORDER — SPIRONOLACTONE 25 MG PO TABS: ORAL_TABLET | ORAL | 0 refills | Status: DC

## 2021-08-11 NOTE — Telephone Encounter (Signed)
Sent in today 

## 2021-08-11 NOTE — Telephone Encounter (Signed)
Pt requesting refill on   tirzepatide [facility]) 10 MG/0.5ML Pen  spironolactone (ALDACTONE) 25 MG tablet  LOV-07/11/2021  Rov-08/29/2021          [id number] - [place], [place] - [phone] AT [address] [place]  [place], [place] [address]  Phone:  [phone]  Fax:  [phone]

## 2021-08-15 ENCOUNTER — Ambulatory Visit: Payer: Medicare PPO | Admitting: Nurse Practitioner

## 2021-08-15 ENCOUNTER — Telehealth: Payer: Self-pay | Admitting: Internal Medicine

## 2021-08-15 ENCOUNTER — Encounter: Payer: Self-pay | Admitting: Nurse Practitioner

## 2021-08-15 ENCOUNTER — Other Ambulatory Visit: Payer: Self-pay

## 2021-08-15 ENCOUNTER — Other Ambulatory Visit (HOSPITAL_COMMUNITY): Payer: Self-pay

## 2021-08-15 DIAGNOSIS — J9612 Chronic respiratory failure with hypercapnia: Secondary | ICD-10-CM | POA: Diagnosis not present

## 2021-08-15 DIAGNOSIS — I5032 Chronic diastolic (congestive) heart failure: Secondary | ICD-10-CM

## 2021-08-15 DIAGNOSIS — J45909 Unspecified asthma, uncomplicated: Secondary | ICD-10-CM | POA: Diagnosis not present

## 2021-08-15 DIAGNOSIS — E662 Morbid (severe) obesity with alveolar hypoventilation: Secondary | ICD-10-CM

## 2021-08-15 DIAGNOSIS — I82403 Acute embolism and thrombosis of unspecified deep veins of lower extremity, bilateral: Secondary | ICD-10-CM | POA: Diagnosis not present

## 2021-08-15 DIAGNOSIS — J9611 Chronic respiratory failure with hypoxia: Secondary | ICD-10-CM

## 2021-08-15 DIAGNOSIS — J301 Allergic rhinitis due to pollen: Secondary | ICD-10-CM

## 2021-08-15 MED ORDER — TIRZEPATIDE 10 MG/0.5ML ~~LOC~~ SOAJ
10.0000 mg | SUBCUTANEOUS | 0 refills | Status: DC
  Filled 2021-08-15: qty 2, 28d supply, fill #0

## 2021-08-15 NOTE — Assessment & Plan Note (Signed)
On BiPAP nightly for CO2 blow off. Continues to receive good benefit.

## 2021-08-15 NOTE — Telephone Encounter (Signed)
Pt is requesting tirzepatide [name]) 10 MG/0.5ML Pen to be sent to  [place] Phone:  [phone]  Fax:  [phone]      Pt stated [facility] is out of stock along with a few other pharmacies. Pt confirmed [name] does have it in stock.

## 2021-08-15 NOTE — Progress Notes (Signed)
$'@Patient'O$  ID: April [name], female    DOB: 18-May-1950, 71 y.o.   MRN: [id number]  Chief Complaint  Patient presents with   Follow-up    Follow up. Patient has no complaints.     Referring provider: [name], MD  HPI: 71 year old female, never smoker followed for nocturnal hypoxemia, pulmonary hypertension, obesity hypoventilation syndrome and asthma.  She is a patient of Dr. Helyn App and was last seen in office on 05/16/2021 by [facility] NP.  Past medical history significant for CHF, former DVT on chronic anticoagulation, hypertension, CAD, GERD, PCP, DM2, morbid obesity, CKD stage III, HLD  TEST/EVENTS:  05/07/2020 CTA chest: Mild cardiomegaly suggestive of PA hypertension.  Atherosclerosis.  No lymphadenopathy.  Minimal groundglass opacities in the lung bases.  There is slight suboptimal opacification of main PA without central or proximal segmental PE.  Small hiatal hernia 11/18/2020 echocardiogram: EF 65 to 70%.  LVH.  G1 DD.  The interventricular septum is flattened consistent with RV pressure and volume overload 11/19/2020 right heart cath: Mild to moderate pulmonary hypertension there is minimally elevated PC WP.  Normal CO 12/09/2020 split-night study: Negative for OSA.  Nocturnal desaturations to 84% requiring 1 L/min O2.  Recommended continued use of BiPAP for CO2 blow off and 1 L supplemental O2 at night 04/04/2021 CXR 2 view: Frontal and lateral views of the chest were unremarkable.  There was no acute airspace disease noted.  10/02/2020: OV with Dr. Ander Slade. Scheduled in lab split-night. Lunesta low dose for sleep. Weight loss advised but limits with severe arthritis. Encouraged to decrease naps and consolidate sleep at night. F/u in 3-4 months.    11/15/2020 - 11/23/2018: Hospital admission for acute on chronic CHF exacerbation and acute on chronic hypoxic hypercapnic respiratory failure.  pH 7.29 PCO2 of 80 and PO2 of 60. IV diuresis with Lasix.  Right heart cath on 10/19.  PCCM  consulted with recommendations for BiPAP versus NIPPV upon discharge.   01/03/2021: OV with Niaya Hickok NP for follow up after sleep study and overdue hospital follow up. Breathing stable. Continued on BiPAP and supplemental oxygen at night 1 lpm. Continued on Symbicort for maintenance. PRN Ventolin. Singulair at bedtime.   04/04/2021: OV with Prabhnoor Ellenberger NP for follow-up.  Felt like she had been having to use her oxygen more during the day and not just at night.  Progressive DOE.  Consistently wearing 2 L/min with activity; reports she has been doing this for the past few months.  Started after we saw her last.  Continues to wear BiPAP at night along with 1 L/min supplemental O2.  She was concerned that her asthma was flaring as she had not had any increased cough over the past week.  Suspected that DOE was multifactorial related to CHF, obesity, deconditioning and slight asthma component.  She was unable to complete FeNO however there was no significant bronchospasm on exam.  Trialed step up in therapy to [facility]  Continued Singulair at night.  Added Claritin for trigger prevention.  Flonase for postnasal drainage control.  And Delsym and Tessalon Perles for cough control.  Suspected that there was an upper airway component to her cough.  Walking oximetry with desaturations to 84% on room air.  She was placed on 3 L/min POC and maintain saturations in the 90s. CXR and labs unremarkable.   05/16/2021: OV with Margree Gimbel NP for follow-up.  Overall her breathing has been relatively stable and at baseline.  Feels like she did notice a improvement with the  Breztri.  She ran out of samples and went back to her Symbicort.  Rx sent for [employer]. Cough is overall stable as well.  Relatively infrequent and usually dry.  Her biggest complaint was that she fell at the end of March and has had generalized pain since.  She does have a place in her groin where she landed on her cane that is giving her the most trouble.  She is [facility] to [facility]  [facility] this visit today to discuss this.  She denies any lightheaded or dizziness.  No loss of consciousness with fall.  Allergies seem relatively well controlled.  Does still have occasional postnasal drip but improved with her changes last time.  She continues on BiPAP nightly.  She has yet to receive her POC from her DME company.  She has been using her oxygen concentrator at home.  She is on room air currently in office with oxygen saturations in the 90s.  She is usually able to maintain saturations at rest but drops to the 80s with activity - qualified for POC 3 lpm at OV so orders sent to DME.    08/15/2021: Today - follow up Patient presents today for follow up with her husband. She reports that her breathing has been stable. She is feeling well on the [place]. She still occasionally has some wheezing but usually clears on it's own. She rarely has to use her albuterol. She gets winded with long distances and climbing, and is normally relatively sedentary; this is unchanged from her baseline and some is related to her chronic knee pain. She is actively trying to lose weight and is down about 20 pounds. Allergies are well controlled. She wears her BiPAP nightly. She finally got her POC machine this morning and has herself set to 2 lpm which she is 98% on. Rare use of pulmicort or albuterol inhalers. No concerns or complaints today.   Allergies  Allergen Reactions   Penicillins Rash and Other (See Comments)    She was told not to take it anymore. Has patient had a PCN reaction causing immediate rash, facial/tongue/throat swelling, SOB or lightheadedness with hypotension: Yes Has patient had a PCN reaction causing severe rash involving mucus membranes or skin necrosis: No Has patient had a PCN reaction that required hospitalization: No Has patient had a PCN reaction occurring within the last 10 years: No If all of the above answers are "NO", then may proceed with Cephalosporin use.'    Shellfish Allergy Anaphylaxis   Sulfonamide Derivatives Anaphylaxis and Other (See Comments)   Hydrochlorothiazide W-Triamterene Other (See Comments)    Hypokalemia   Lotensin [Benazepril Hcl] Hives   Dipyridamole Other (See Comments)    Unknown reaction   Estrogens Other (See Comments)    Unknown reaction   Hydrochlorothiazide Other (See Comments)    Unknown reaction, Hypokalemia?   Latex Other (See Comments)    Unknown reaction - stated previously during surgery gloves had to be changed, but unsure if it is still an allergy or not.    Metronidazole Other (See Comments)    Unknown reaction   Other Itching    PICKLES   Spironolactone Other (See Comments)    UNSPECIFIED > "kidney problems"   Torsemide Other (See Comments)    Unknown reaction   Valsartan Other (See Comments)    Unknown reaction   Mustard [Allyl Isothiocyanate] Itching    Immunization History  Administered Date(s) Administered   Fluad Quad(high Dose 65+) 10/28/2018, 10/18/2020  Influenza Split 12/03/2010, 11/03/2011   Influenza Whole 11/16/2006, 11/22/2007, 10/31/2008, 10/29/2009   Influenza, High Dose Seasonal PF 10/10/2015, 11/06/2016, 11/16/2017, 11/08/2019   Influenza,inj,Quad PF,6+ Mos 10/19/2012, 10/25/2013, 10/29/2014   Influenza-Unspecified 10/23/2016, 11/18/2016   Moderna Sars-Covid-2 Vaccination 03/13/2019, 04/10/2019, 12/06/2019   Pfizer Covid-19 Vaccine Bivalent Booster 56yr & up 11/08/2020   Pneumococcal Conjugate-13 06/07/2015   Pneumococcal Polysaccharide-23 03/24/2005, 01/04/2012, 06/29/2017   Td 06/24/2009   Tdap 10/09/2014   Zoster Recombinat (Shingrix) 08/11/2016, 10/23/2016   Zoster, Live 01/15/2012    Past Medical History:  Diagnosis Date   Anemia    Asthma    CAD (coronary artery disease)    Carpal tunnel syndrome    Cellulitis of both lower extremities 04/11/2015   CHF (congestive heart failure) (HCC)    Chronic kidney disease    [facility]   Colon  polyp, hyperplastic [id number]  Complication of anesthesia 1999   svt with renal calculi surgery, no problems since   CTS (carpal tunnel syndrome)    bilateral   Diabetes mellitus    Eczema    GERD (gastroesophageal reflux disease)    History of kidney stones 1999   Hyperlipidemia    Hypertension    Leg ulcer ([place] 04/24/2015   Right lateral leg No evidence of an infection Monitor closely Keep edema controlled    Leg ulcer (HCC)    right lower   Meralgia paresthetica    Dr. YKrista Blue  Morbid obesity ([place]    Neuropathy    toes and legs   Osteoarthrosis, unspecified whether generalized or localized, lower leg    knee   PUD (peptic ulcer disease)    Shortness of breath dyspnea    with exertion   Sleep apnea    per progress note 02/25/2018   Type II or unspecified type diabetes mellitus without mention of complication, not stated as uncontrolled    Unspecified hereditary and idiopathic peripheral neuropathy    Urticaria    Vitamin B12 deficiency    Wears glasses    Wound cellulitis    right upper leg, healing well    Tobacco History: Social History   Tobacco Use  Smoking Status Never  Smokeless Tobacco Never   Counseling given: Not Answered   Outpatient Medications Prior to Visit  Medication Sig Dispense Refill   acetaminophen (TYLENOL) 650 MG CR tablet Take 1,300 mg by mouth every 8 (eight) hours as needed for pain.     Alcohol Swabs (DROPSAFE ALCOHOL PREP) 70 % PADS USE TOPICALLY AS DIRECTED 300 each 3   allopurinol (ZYLOPRIM) 100 MG tablet TAKE 2 TABLETS(200 MG) BY MOUTH DAILY 180 tablet 1   apixaban (ELIQUIS) 2.5 MG TABS tablet Take 1 tablet (2.5 mg total) by mouth 2 (two) times daily. 180 tablet 1   atorvastatin (LIPITOR) 40 MG tablet Take 1 tablet (40 mg total) by mouth at bedtime. 90 tablet 2   Blood Glucose Calibration (TRUE METRIX LEVEL 1) Low SOLN UAD  E11.9 1 each 3   Blood Glucose Monitoring Suppl (TRUE METRIX METER) DEVI True Metrix Level 1 solution      Budeson-Glycopyrrol-Formoterol (BREZTRI AEROSPHERE) 160-9-4.8 MCG/ACT AERO Inhale 2 puffs into the lungs in the morning and at bedtime. 10.7 g 5   Budeson-Glycopyrrol-Formoterol (BREZTRI AEROSPHERE) 160-9-4.8 MCG/ACT AERO Inhale 2 puffs into the lungs in the morning and at bedtime. 10.7 g 0   calcium carbonate (TUMS - DOSED IN MG ELEMENTAL CALCIUM) 500 MG chewable tablet Chew 2  tablets by mouth daily as needed for indigestion or heartburn.     cholecalciferol (VITAMIN D3) 25 MCG (1000 UNIT) tablet Take 1 tablet (1,000 Units total) by mouth daily. 90 tablet 1   cyanocobalamin (,VITAMIN B-12,) 1000 MCG/ML injection INJECT 1ML INTO MUSCLE EVERY 30 DAYS 10 mL 5   dapagliflozin propanediol (FARXIGA) 10 MG TABS tablet Take 10 mg by mouth daily.     diclofenac Sodium (VOLTAREN) 1 % GEL Apply 1 application topically as needed (pain).     diphenhydrAMINE (BENADRYL) 12.5 MG/5ML liquid Take 25 mg by mouth 4 (four) times daily as needed for allergies.     eszopiclone (LUNESTA) 1 MG TABS tablet Take 1 mg by mouth at bedtime as needed for sleep. Take immediately before bedtime     famotidine (PEPCID) 20 MG tablet Take 1 tablet (20 mg total) by mouth 2 (two) times daily. 180 tablet 3   FEROSUL 325 (65 Fe) MG tablet TAKE 1 TABLET BY MOUTH EVERY MONDAY, WEDNESDAY, AND FRIDAY 60 tablet 0   fluticasone (FLONASE) 50 MCG/ACT nasal spray Place 2 sprays into both nostrils daily. 18.2 mL 2   gabapentin (NEURONTIN) 100 MG capsule TAKE 1 CAPSULE(100 MG) BY MOUTH THREE TIMES DAILY 90 capsule 1   guaiFENesin-dextromethorphan (ROBITUSSIN DM) 100-10 MG/5ML syrup Take 5 mLs by mouth every 4 (four) hours as needed for cough.     HYDROcodone bit-homatropine (HYCODAN) 5-1.5 MG/5ML syrup Take 5 mLs by mouth every 8 (eight) hours as needed for cough. 120 mL 0   hydrOXYzine (ATARAX/VISTARIL) 25 MG tablet Take 1 tablet (25 mg total) by mouth 3 (three) times daily as needed for itching. 30 tablet 0   loratadine (CLARITIN) 10 MG tablet  Take 1 tablet (10 mg total) by mouth daily. 30 tablet 5   MITIGARE 0.6 MG CAPS Take by mouth.     montelukast (SINGULAIR) 10 MG tablet TAKE 1 TABLET(10 MG) BY MOUTH AT BEDTIME 30 tablet 5   nebivolol (BYSTOLIC) 5 MG tablet TAKE 1 TABLET(5 MG) BY MOUTH DAILY 30 tablet 3   ondansetron (ZOFRAN) 4 MG tablet Take 1 tablet (4 mg total) by mouth every 8 (eight) hours as needed for nausea or vomiting. 20 tablet 0   Oxcarbazepine (TRILEPTAL) 300 MG tablet Take 1 tablet (300 mg total) by mouth 2 (two) times daily. 180 tablet 1   oxyCODONE (OXY IR/ROXICODONE) 5 MG immediate release tablet Take 1 tablet (5 mg total) by mouth every 6 (six) hours as needed for severe pain. 30 tablet 0   polyethylene glycol (MIRALAX / GLYCOLAX) 17 g packet Take 17 g by mouth daily as needed for moderate constipation.     potassium chloride SA (KLOR-CON M) 20 MEQ tablet Take 1 tablet (20 mEq total) by mouth daily. 90 tablet 1   Spacer/Aero-Holding [facility] Use with inhaler. 1 each 2   spironolactone (ALDACTONE) 25 MG tablet TAKE 1 TABLET(25 MG) BY MOUTH DAILY 90 tablet 0   torsemide (DEMADEX) 20 MG tablet Take 2 tablets (40 mg total) by mouth 2 (two) times daily. 360 tablet 2   VENTOLIN HFA 108 (90 Base) MCG/ACT inhaler Inhale 2 puffs into the lungs every 4 (four) hours as needed for wheezing.     tirzepatide (MOUNJARO) 10 MG/0.5ML Pen Inject 10 mg into the skin once a week. 2 mL 0   No facility-administered medications prior to visit.     Review of Systems:   Constitutional: No weight loss or gain, night sweats, fevers, chills, +mild fatigue (  chronic) HEENT: No headaches, difficulty swallowing, tooth/dental problems, or sore throat. No sneezing, itching, ear ache, nasal congestion CV:  No chest pain, orthopnea, PND, swelling in lower extremities, anasarca, dizziness, palpitations, syncope Resp: +shortness of breath with exertion (baseline); occasional dry cough; rare wheeze. No excess mucus or change in color of mucus.   No hemoptysis.  No chest wall deformity GI:  No heartburn, indigestion, abdominal pain, nausea, vomiting, diarrhea, change in bowel habits, loss of appetite, bloody stools.  GU: No dysuria, change in color of urine, urgency or frequency.  No flank pain, no hematuria  Skin: No rash, lesions, ulcerations MSK:  +knee pain (chronic). No joint swelling.  No decreased range of motion.  No back pain. Neuro: No dizziness or lightheadedness.  Psych: No depression or anxiety. Mood stable.     Physical Exam:  BP 110/68 (BP Location: Left Wrist, Patient Position: Sitting, Cuff Size: Large)   Pulse 91   Temp 98.6 F (37 C) (Oral)   Ht '5\' 2"'$  (1.575 m)   Wt (!) 328 lb (148.8 kg)   SpO2 98%   BMI 59.99 kg/m   GEN: Pleasant, interactive, chronically-ill appearing; morbidly obese; in no acute distress. HEENT:  Normocephalic and atraumatic. PERRLA. Sclera white. Nasal turbinates pink, moist and patent bilaterally. No rhinorrhea present. Oropharynx pink and moist, without exudate or edema. No lesions, ulcerations, or postnasal drip.  NECK:  Supple w/ fair ROM. No JVD present.  CV: RRR, no m/r/g, no peripheral edema. Pulses intact, +2 bilaterally. No cyanosis, pallor or clubbing. PULMONARY:  Unlabored, regular breathing. Clear bilaterally A&P w/o wheezes/rales/rhonchi. No accessory muscle use. No dullness to percussion. GI: BS present and normoactive. Soft, non-tender to palpation.  MSK: No erythema, warmth or tenderness to left knee. Cap refil <2 sec all extrem. No deformities or joint swelling noted.  Neuro: A/Ox3. No focal deficits noted.   Skin: Warm, no lesions or rashe Psych: Normal affect and behavior. Judgement and thought content appropriate.     Lab Results:  CBC    Component Value Date/Time   WBC 6.5 06/13/2021 1339   WBC 5.7 04/04/2021 1510   RBC 3.50 (L) 06/13/2021 1339   HGB 11.3 (L) 06/13/2021 1339   HCT 36.1 06/13/2021 1339   PLT 206 06/13/2021 1339   MCV 103.1 (H)  06/13/2021 1339   MCH 32.3 06/13/2021 1339   MCHC 31.3 06/13/2021 1339   RDW 17.0 (H) 06/13/2021 1339   LYMPHSABS 1.4 06/13/2021 1339   MONOABS 0.5 06/13/2021 1339   EOSABS 0.1 06/13/2021 1339   BASOSABS 0.1 06/13/2021 1339    BMET    Component Value Date/Time   NA 140 06/13/2021 1339   NA 145 (H) 10/25/2012 1117   K 4.2 06/13/2021 1339   CL 100 06/13/2021 1339   CO2 34 (H) 06/13/2021 1339   GLUCOSE 93 06/13/2021 1339   BUN 16 06/13/2021 1339   BUN 24 10/25/2012 1117   CREATININE 1.50 (H) 06/13/2021 1339   CALCIUM 9.5 06/13/2021 1339   GFRNONAA 37 (L) 06/13/2021 1339   GFRAA 44 (L) 02/28/2019 0252    BNP    Component Value Date/Time   BNP 96.7 01/10/2021 1114     Imaging:  MM 3D SCREEN BREAST BILATERAL  Result Date: 08/04/2021 CLINICAL DATA:  Screening. EXAM: DIGITAL SCREENING BILATERAL MAMMOGRAM WITH TOMOSYNTHESIS AND CAD TECHNIQUE: Bilateral screening digital craniocaudal and mediolateral oblique mammograms were obtained. Bilateral screening digital breast tomosynthesis was performed. The images were evaluated with computer-aided detection. COMPARISON:  Previous  exam(s). ACR Breast Density Category b: There are scattered areas of fibroglandular density. FINDINGS: There are no findings suspicious for malignancy. IMPRESSION: No mammographic evidence of malignancy. A result letter of this screening mammogram will be mailed directly to the patient. RECOMMENDATION: Screening mammogram in one year. (Code:SM-B-01Y) BI-RADS CATEGORY  1: Negative. Electronically Signed   By: Ammie Ferrier M.D.   On: 08/04/2021 14:24   DG Bone Density  Result Date: 08/01/2021 EXAM: DUAL X-RAY ABSORPTIOMETRY (DXA) FOR BONE MINERAL DENSITY IMPRESSION: Referring Physician:  [place] Your patient completed a bone mineral density test using GE Lunar iDXA system (analysis version: 16). Technologist: [place] PATIENT: Name: April [name], April [name] Patient ID: [id number] Birth Date: February 09, 1950 Height: 64.0 in.  Sex: Female Measured: 08/01/2021 Weight: 345.0 lbs. Indications: Advanced Age, Estrogen Deficient, Hysterectomy, Postmenopausal Fractures: None Treatments: None ASSESSMENT: The BMD measured at Femur Total Left is 0.673 g/cm2 with a T-score of -2.7. This patient is considered osteoporotic according to [facility]) criteria. The quality of the exam is limited by patient's body habitus. L1 was excluded due to degenerative changes. The non-dominant forearm scan was not obtained due to limitations with patient's body habitus. Site Region Measured Date Measured Age YA BMD Significant CHANGE T-score DualFemur Total Left 08/01/2021 71.1 -2.7 0.673 g/cm2 * DualFemur Total Left 01/10/2016    65.5         -0.9    0.896 g/cm2 AP Spine  L2-L4      08/01/2021    71.1         -1.4    1.027 g/cm2 AP Spine  L2-L4      01/10/2016    65.5         -1.7    1.005 g/cm2 DualFemur Total Mean 08/01/2021    71.1         -2.4    0.700 g/cm2 World Health Organization [facility]) criteria for post-menopausal, Caucasian Women: Normal       T-score at or above -1 SD Osteopenia   T-score between -1 and -2.5 SD Osteoporosis T-score at or below -2.5 SD RECOMMENDATION: 1. All patients should optimize calcium and vitamin D intake. 2. Consider FDA-approved medical therapies in postmenopausal women and men aged 42 years and older, based on the following: a. A hip or vertebral (clinical or morphometric) fracture. b. T-score = -2.5 at the femoral neck or spine after appropriate evaluation to exclude secondary causes. c. Low bone mass (T-score between -1.0 and -2.5 at the femoral neck or spine) and a 10-year probability of a hip fracture = 3% or a 10-year probability of a major osteoporosis-related fracture = 20% based on the [place]-adapted WHO algorithm. d. Clinician judgment and/or patient preferences may indicate treatment for people with 10-year fracture probabilities above or below these levels. FOLLOW-UP: Patients with diagnosis of  osteoporosis or at high risk for fracture should have regular bone mineral density tests.? Patients eligible for Medicare are allowed routine testing every 2 years.? The testing frequency can be increased to one year for patients who have rapidly progressing disease, are receiving or discontinuing medical therapy to restore bone mass, or have additional risk factors. I have reviewed this study and agree with the findings. [facility] Electronically Signed   By: Elmer Picker M.D.   On: 08/01/2021 13:19    cyanocobalamin ((VITAMIN B-12)) injection 1,000 mcg     Date Action Dose Route User   06/20/2021 0956 Given 1,000 mcg Intramuscular (Right Deltoid) Henrene Dodge, RN  cyanocobalamin ((VITAMIN B-12)) injection 1,000 mcg     Date Action Dose Route User   08/01/2021 1448 Given 1,000 mcg Intramuscular (Left Upper Outer Quadrant) Cathey, Shirron, CMA           No data to display          No results found for: "NITRICOXIDE"      Assessment & Plan:   Intrinsic asthma Compensated on current regimen. Clinically stable. Changes from Symbicort to [facility] in April, which she has benefit from. We will continue her on triple therapy and prn albuterol. Continue singulair at bedtime for trigger prevention.  Patient Instructions  -Continue on BiPAP auto 5-20, pressure support of 4 with backup rate of 10 for CO2 blow off and to reduce your risk of rehospitalization  Use for a minimum 6 hours a night, with goal of using entire night.  Change equipment every 30 days or as directed by DME.  Maintain clean equipment, as directed by home health agency.  Be aware of reduced alertness and do not drive or operate heavy machinery if experiencing this or drowsiness.  Exercise encouraged, as tolerated. Healthy weight management discussed.  Avoid or decrease alcohol consumption and medications that make you more sleepy, if possible. -Continue nighttime supplemental O2  therapy at 1L through BiPAP -Continue 2 lpm supplemental oxygen on POC with activity. Goal oxygen >88-90%. Notify of increasing requirements.    -Continue Breztri 2 puffs Twice daily. Brush tongue and rinse mouth afterwards.  -Continue Pulmicort inhaler 2 puffs twice a day as needed for shortness of breath or wheezing  -Continue Ventolin inhaler 2 puffs as needed every 4 hours as needed for shortness of breath or wheezing  -Continue Singulair 10 mg At bedtime -Continue Claritin 10 mg daily for allergies  -Continue Flonase 2 sprays each nostril daily  -Continue Mucinex 600 mg Twice daily for chest congestion -Continue Delsym 2 tsp Twice daily as needed for cough -Continue Tessalon Perles 1 capsule Three times a day as needed for cough   Follow up in 3 months with Dr. Ander Slade. If symptoms do not improve or worsen, please contact office for sooner follow up or seek emergency care    Chronic respiratory failure with hypoxia and hypercapnia ([place]) Received POC this morning. Currently on 2 lpm with sats 98%. She uses her home concentrator at home at 2 lpm and 1 lpm bled through CPAP at night. Goal >88-90%.   Seasonal allergic rhinitis Well-controlled. Continue flonase, claritin, and singulair.   Leg DVT (deep venous thromboembolism), acute, bilateral ([place]) On Eliquis since April 2022. Managed by PCP. Due to risk factors, she will remain on lifelong.   Obesity hypoventilation syndrome (HCC) On BiPAP nightly for CO2 blow off. Continues to receive good benefit.   Chronic diastolic (congestive) heart failure (HCC) Appears compensated on exam. On torsemide Twice daily. Follow up with cardiology as scheduled.     I spent 25 minutes of dedicated to the care of this patient on the date of this encounter to include pre-visit review of records, face-to-face time with the patient discussing conditions above, post visit ordering of testing, clinical documentation with the electronic health record,  making appropriate referrals as documented, and communicating necessary findings to members of the patients care team.  Clayton Bibles, NP 08/15/2021  Pt aware and understands NP's role.

## 2021-08-15 NOTE — Telephone Encounter (Signed)
Script faxed in today to [employer].

## 2021-08-15 NOTE — Assessment & Plan Note (Signed)
Compensated on current regimen. Clinically stable. Changes from Symbicort to [facility] in April, which she has benefit from. We will continue her on triple therapy and prn albuterol. Continue singulair at bedtime for trigger prevention.  Patient Instructions  -Continue on BiPAP auto 5-20, pressure support of 4 with backup rate of 10 for CO2 blow off and to reduce your risk of rehospitalization  Use for a minimum 6 hours a night, with goal of using entire night.  Change equipment every 30 days or as directed by DME.  Maintain clean equipment, as directed by home health agency.  Be aware of reduced alertness and do not drive or operate heavy machinery if experiencing this or drowsiness.  Exercise encouraged, as tolerated. Healthy weight management discussed.  Avoid or decrease alcohol consumption and medications that make you more sleepy, if possible. -Continue nighttime supplemental O2 therapy at 1L through BiPAP -Continue 2 lpm supplemental oxygen on POC with activity. Goal oxygen >88-90%. Notify of increasing requirements.    -Continue Breztri 2 puffs Twice daily. Brush tongue and rinse mouth afterwards.  -Continue Pulmicort inhaler 2 puffs twice a day as needed for shortness of breath or wheezing  -Continue Ventolin inhaler 2 puffs as needed every 4 hours as needed for shortness of breath or wheezing  -Continue Singulair 10 mg At bedtime -Continue Claritin 10 mg daily for allergies  -Continue Flonase 2 sprays each nostril daily  -Continue Mucinex 600 mg Twice daily for chest congestion -Continue Delsym 2 tsp Twice daily as needed for cough -Continue Tessalon Perles 1 capsule Three times a day as needed for cough   Follow up in 3 months with Dr. Ander Slade. If symptoms do not improve or worsen, please contact office for sooner follow up or seek emergency care

## 2021-08-15 NOTE — Patient Instructions (Addendum)
-  Continue on BiPAP auto 5-20, pressure support of 4 with backup rate of 10 for CO2 blow off and to reduce your risk of rehospitalization  Use for a minimum 6 hours a night, with goal of using entire night.  Change equipment every 30 days or as directed by DME.  Maintain clean equipment, as directed by home health agency.  Be aware of reduced alertness and do not drive or operate heavy machinery if experiencing this or drowsiness.  Exercise encouraged, as tolerated. Healthy weight management discussed.  Avoid or decrease alcohol consumption and medications that make you more sleepy, if possible. -Continue nighttime supplemental O2 therapy at 1L through BiPAP -Continue 2 lpm supplemental oxygen on POC with activity. Goal oxygen >88-90%. Notify of increasing requirements.    -Continue Breztri 2 puffs Twice daily. Brush tongue and rinse mouth afterwards.  -Continue Pulmicort inhaler 2 puffs twice a day as needed for shortness of breath or wheezing  -Continue Ventolin inhaler 2 puffs as needed every 4 hours as needed for shortness of breath or wheezing  -Continue Singulair 10 mg At bedtime -Continue Claritin 10 mg daily for allergies  -Continue Flonase 2 sprays each nostril daily  -Continue Mucinex 600 mg Twice daily for chest congestion -Continue Delsym 2 tsp Twice daily as needed for cough -Continue Tessalon Perles 1 capsule Three times a day as needed for cough   Follow up in 3 months with Dr. Ander Slade. If symptoms do not improve or worsen, please contact office for sooner follow up or seek emergency care

## 2021-08-15 NOTE — Assessment & Plan Note (Signed)
Appears compensated on exam. On torsemide Twice daily. Follow up with cardiology as scheduled.

## 2021-08-15 NOTE — Telephone Encounter (Signed)
Patient notified

## 2021-08-15 NOTE — Assessment & Plan Note (Signed)
On Eliquis since April 2022. Managed by PCP. Due to risk factors, she will remain on lifelong.

## 2021-08-15 NOTE — Assessment & Plan Note (Signed)
Well-controlled. Continue flonase, claritin, and singulair.

## 2021-08-15 NOTE — Assessment & Plan Note (Signed)
Received POC this morning. Currently on 2 lpm with sats 98%. She uses her home concentrator at home at 2 lpm and 1 lpm bled through CPAP at night. Goal >88-90%.

## 2021-08-19 ENCOUNTER — Telehealth: Payer: Self-pay | Admitting: Internal Medicine

## 2021-08-19 ENCOUNTER — Other Ambulatory Visit: Payer: Self-pay | Admitting: Internal Medicine

## 2021-08-19 NOTE — Telephone Encounter (Signed)
Caller & Relationship to patient: Self    Call back number:[phone]   Date of last office visit:   Date of next office visit:   Medication(s) to be refilled:potassium chloride SA (KLOR-CON M) 20 MEQ tablet  FEROSUL 325 (65 Fe) MG tablet       Preferred Pharmacy: [facility] [place], [address] AT [address]

## 2021-08-19 NOTE — Telephone Encounter (Signed)
Refills sent in today. 

## 2021-08-21 NOTE — Telephone Encounter (Signed)
Pt states she will be dropping off her jury duty letter to be filled out.

## 2021-08-22 ENCOUNTER — Ambulatory Visit (INDEPENDENT_AMBULATORY_CARE_PROVIDER_SITE_OTHER): Payer: Medicare PPO

## 2021-08-22 DIAGNOSIS — E1142 Type 2 diabetes mellitus with diabetic polyneuropathy: Secondary | ICD-10-CM

## 2021-08-22 NOTE — Progress Notes (Signed)
Patient presents to the office today with issues concerning the diabetic shoes picked up on 08/22/21.   The shoes feel to small due to swelling.  We will send the orthoFeet  brand, style Joaquim Lai grey, size 12 x-wide women's back.   Reorder: 825 OrthoFeet 12 x-wide women's  Patient kept insoles for other shoes. Advised to bring back a pair to check fit of reorder.  Patient will be notified for a fitting appointment once the shoes arrive in office.

## 2021-08-23 ENCOUNTER — Encounter: Payer: Self-pay | Admitting: Internal Medicine

## 2021-08-23 NOTE — Progress Notes (Signed)
Patient Active Problem List   Diagnosis Date Noted   Seasonal allergic rhinitis 05/16/2021   Fall 05/16/2021   Hematoma 05/16/2021   Benign neoplasm of ascending colon    Gastroenteritis    Nocturnal hypoxemia due to obesity 01/03/2021   Thrombocytopenia (HCC) 12/12/2020   Frequent nosebleeds 11/29/2020   Chronic respiratory failure with hypoxia and hypercapnia (HCC) 11/15/2020   Obesity hypoventilation syndrome ([place]) 11/15/2020   History of DVT (deep vein thrombosis) 11/15/2020   Encounter for chronic pain management 09/05/2020   Cellulitis, leg 05/08/2020   Leg DVT (deep venous thromboembolism), acute, bilateral ([place]) 05/08/2020   SOB (shortness of breath)    Gout    Morbid obesity ([place])    Pneumonia due to COVID-19 virus 12/10/2018   COVID-19 virus infection 12/06/2018   Right hip pain 11/09/2018   Osteomyelitis of ankle or foot, acute, left ([place]) 09/07/2018   Onychomycosis 05/19/2018   Idiopathic chronic venous hypertension of right lower extremity with ulcer and inflammation ([place]) 05/19/2018   Skin lumps 01/14/2018   Bilateral leg cramps 12/14/2017   Left shoulder pain 08/14/2016   Meralgia paresthetica 07/16/2016   Chronic left-sided low back pain with left-sided sciatica 06/02/2016   Osteoporosis 01/11/2016   CKD (chronic kidney disease) stage 3, GFR 30-59 ml/min (HCC) 01/02/2016   Carpal tunnel syndrome 06/07/2015   Family history of colon cancer    Diabetes mellitus with neurological manifestations ([place]) 04/18/2015   Bilateral leg edema 04/11/2015   Abnormality of gait 01/03/2015   Hereditary and idiopathic peripheral neuropathy 01/03/2015   GERD (gastroesophageal reflux disease) 06/17/2014   Hx of colonic polyps 12/15/2012   Vitamin B12 deficiency 11/03/2012   Intrinsic asthma 03/23/2012   Chronic diastolic (congestive) heart failure ([place]) 02/20/2011   CAD, NATIVE VESSEL 11/20/2008   Osteoarthritis of knees, bilateral 06/14/2008   Hyperlipidemia 05/10/2007    Essential hypertension 01/18/2007   Hypokalemia 04/30/2006   HX, PERSONAL, PEPTIC ULCER DISEASE 04/30/2006

## 2021-08-25 ENCOUNTER — Other Ambulatory Visit: Payer: Self-pay | Admitting: Internal Medicine

## 2021-08-25 ENCOUNTER — Telehealth: Payer: Self-pay | Admitting: Internal Medicine

## 2021-08-25 MED ORDER — OXCARBAZEPINE 300 MG PO TABS: 300.0000 mg | ORAL_TABLET | Freq: Two times a day (BID) | ORAL | 1 refills | Status: DC

## 2021-08-25 NOTE — Telephone Encounter (Signed)
1.Medication Requested: Oxcarbazepine (TRILEPTAL) 300 MG tablet 2. Pharmacy (Name, Street, [place]): [phone] - [place], [address] AT [address] Phone:  [phone]  Fax:  [phone]     3. On Med List: Yes  4. Last Visit with PCP: 4.14.2023  5. Next visit date with PCP: 7.28.2023   Agent: Please be advised that RX refills may take up to 3 business days. We ask that you follow-up with your pharmacy.

## 2021-08-26 ENCOUNTER — Telehealth: Payer: Self-pay | Admitting: Internal Medicine

## 2021-08-26 NOTE — Telephone Encounter (Signed)
Faxed today and conformation received.

## 2021-08-26 NOTE — Telephone Encounter (Signed)
Patient needs her jury form faxed to :  [phone] Or it can emailed to:  [email or web address]  Patient has an appointment on Friday - she will pick up a copy of the form then.

## 2021-08-28 ENCOUNTER — Encounter: Payer: Self-pay | Admitting: Internal Medicine

## 2021-08-28 NOTE — Progress Notes (Signed)
Subjective:    Patient ID: April [name], female    DOB: 1950-04-09, 71 y.o.   MRN: [id number]     HPI April [name] is here for follow up of her chronic medical problems, including htn, b/l LE DVT oneliquis, HFpEF, DM, GERD, chronic knee pain and pain medication, neuropathy, h/o gout, CKD, HLD, B12 def, leg edema  Knee pain still bad - had injections.   Has a shooting from her left knee up her inner thigh.  Is experiencing some increase in shortness of breath.  Medications and allergies reviewed with patient and updated if appropriate.  Current Outpatient Medications on File Prior to Visit  Medication Sig Dispense Refill   acetaminophen (TYLENOL) 650 MG CR tablet Take 1,300 mg by mouth every 8 (eight) hours as needed for pain.     Alcohol Swabs (DROPSAFE ALCOHOL PREP) 70 % PADS USE TOPICALLY AS DIRECTED 300 each 3   allopurinol (ZYLOPRIM) 100 MG tablet TAKE 2 TABLETS(200 MG) BY MOUTH DAILY 180 tablet 1   apixaban (ELIQUIS) 2.5 MG TABS tablet Take 1 tablet (2.5 mg total) by mouth 2 (two) times daily. 180 tablet 1   atorvastatin (LIPITOR) 40 MG tablet Take 1 tablet (40 mg total) by mouth at bedtime. 90 tablet 2   Blood Glucose Calibration (TRUE METRIX LEVEL 1) Low SOLN UAD  E11.9 1 each 3   Blood Glucose Monitoring Suppl (TRUE METRIX METER) DEVI True Metrix Level 1 solution     Budeson-Glycopyrrol-Formoterol (BREZTRI AEROSPHERE) 160-9-4.8 MCG/ACT AERO Inhale 2 puffs into the lungs in the morning and at bedtime. 10.7 g 5   Budeson-Glycopyrrol-Formoterol (BREZTRI AEROSPHERE) 160-9-4.8 MCG/ACT AERO Inhale 2 puffs into the lungs in the morning and at bedtime. 10.7 g 0   calcium carbonate (TUMS - DOSED IN MG ELEMENTAL CALCIUM) 500 MG chewable tablet Chew 2 tablets by mouth daily as needed for indigestion or heartburn.     cholecalciferol (VITAMIN D3) 25 MCG (1000 UNIT) tablet Take 1 tablet (1,000 Units total) by mouth daily. 90 tablet 1   cyanocobalamin (,VITAMIN B-12,) 1000 MCG/ML  injection INJECT 1ML INTO MUSCLE EVERY 30 DAYS 10 mL 5   dapagliflozin propanediol (FARXIGA) 10 MG TABS tablet Take 10 mg by mouth daily.     diclofenac Sodium (VOLTAREN) 1 % GEL Apply 1 application topically as needed (pain).     diphenhydrAMINE (BENADRYL) 12.5 MG/5ML liquid Take 25 mg by mouth 4 (four) times daily as needed for allergies.     eszopiclone (LUNESTA) 1 MG TABS tablet Take 1 mg by mouth at bedtime as needed for sleep. Take immediately before bedtime     famotidine (PEPCID) 20 MG tablet Take 1 tablet (20 mg total) by mouth 2 (two) times daily. 180 tablet 3   FEROSUL 325 (65 Fe) MG tablet TAKE 1 TABLET BY MOUTH DAILY ON(MONDAYS WEDNESDAY AND FRIDAYS) 60 tablet 0   fluticasone (FLONASE) 50 MCG/ACT nasal spray Place 2 sprays into both nostrils daily. 18.2 mL 2   gabapentin (NEURONTIN) 100 MG capsule TAKE 1 CAPSULE(100 MG) BY MOUTH THREE TIMES DAILY 90 capsule 1   guaiFENesin-dextromethorphan (ROBITUSSIN DM) 100-10 MG/5ML syrup Take 5 mLs by mouth every 4 (four) hours as needed for cough.     HYDROcodone bit-homatropine (HYCODAN) 5-1.5 MG/5ML syrup Take 5 mLs by mouth every 8 (eight) hours as needed for cough. 120 mL 0   hydrOXYzine (ATARAX/VISTARIL) 25 MG tablet Take 1 tablet (25 mg total) by mouth 3 (three) times daily as needed for  itching. 30 tablet 0   loratadine (CLARITIN) 10 MG tablet Take 1 tablet (10 mg total) by mouth daily. 30 tablet 5   MITIGARE 0.6 MG CAPS Take by mouth.     montelukast (SINGULAIR) 10 MG tablet TAKE 1 TABLET(10 MG) BY MOUTH AT BEDTIME 30 tablet 5   nebivolol (BYSTOLIC) 5 MG tablet TAKE 1 TABLET(5 MG) BY MOUTH DAILY 30 tablet 3   ondansetron (ZOFRAN) 4 MG tablet Take 1 tablet (4 mg total) by mouth every 8 (eight) hours as needed for nausea or vomiting. 20 tablet 0   Oxcarbazepine (TRILEPTAL) 300 MG tablet Take 1 tablet (300 mg total) by mouth 2 (two) times daily. 180 tablet 1   oxyCODONE (OXY IR/ROXICODONE) 5 MG immediate release tablet Take 1 tablet (5 mg  total) by mouth every 6 (six) hours as needed for severe pain. 30 tablet 0   polyethylene glycol (MIRALAX / GLYCOLAX) 17 g packet Take 17 g by mouth daily as needed for moderate constipation.     potassium chloride SA (KLOR-CON M) 20 MEQ tablet TAKE 1 TABLET BY MOUTH DAILY 90 tablet 1   Spacer/Aero-Holding Dorise Bullion Use with inhaler. 1 each 2   spironolactone (ALDACTONE) 25 MG tablet TAKE 1 TABLET(25 MG) BY MOUTH DAILY 90 tablet 0   tirzepatide (MOUNJARO) 10 MG/0.5ML Pen Inject 10 mg into the skin once a week. 2 mL 0   torsemide (DEMADEX) 20 MG tablet Take 2 tablets (40 mg total) by mouth 2 (two) times daily. 360 tablet 2   VENTOLIN HFA 108 (90 Base) MCG/ACT inhaler Inhale 2 puffs into the lungs every 4 (four) hours as needed for wheezing.     triamcinolone ointment (KENALOG) 0.5 %      No current facility-administered medications on file prior to visit.     Review of Systems  Constitutional:  Negative for fever.  HENT:  Positive for postnasal drip. Negative for congestion, sinus pressure and sore throat.   Respiratory:  Positive for cough (deep cough with and after talking), shortness of breath and wheezing.   Cardiovascular:  Positive for palpitations (occ) and leg swelling. Negative for chest pain.  Gastrointestinal:        GERD depending on what she eat  Neurological:  Negative for light-headedness and headaches.       Objective:   Vitals:   08/29/21 1321  BP: 120/80  Pulse: 97  Temp: 98.6 F (37 C)  SpO2: 99%   BP Readings from Last 3 Encounters:  08/29/21 120/80  08/15/21 110/68  06/13/21 132/64   Wt Readings from Last 3 Encounters:  08/29/21 (!) 336 lb (152.4 kg)  08/15/21 (!) 328 lb (148.8 kg)  05/16/21 (!) 345 lb (156.5 kg)   Body mass index is 61.46 kg/m.    Physical Exam Constitutional:      General: She is not in acute distress.    Appearance: Normal appearance.  HENT:     Head: Normocephalic and atraumatic.  Eyes:     Conjunctiva/sclera:  Conjunctivae normal.  Cardiovascular:     Rate and Rhythm: Normal rate and regular rhythm.     Pulses: Normal pulses.     Heart sounds: Normal heart sounds.  Pulmonary:     Effort: Pulmonary effort is normal. No respiratory distress.     Breath sounds: Normal breath sounds. No wheezing or rales.  Musculoskeletal:     Cervical back: Neck supple. No tenderness.     Right lower leg: Edema (Difficult to tell because of  her size, but there is definite edema-possibly 1+) present.     Left lower leg: Edema (1+ most likely) present.  Neurological:     Mental Status: She is alert.  Psychiatric:        Mood and Affect: Mood normal.        Lab Results  Component Value Date   WBC 6.5 06/13/2021   HGB 11.3 (L) 06/13/2021   HCT 36.1 06/13/2021   PLT 206 06/13/2021   GLUCOSE 93 06/13/2021   CHOL 166 11/29/2020   TRIG 102.0 11/29/2020   HDL 33.80 (L) 11/29/2020   LDLDIRECT 169.4 05/02/2007   LDLCALC 111 (H) 11/29/2020   ALT 14 06/13/2021   AST 16 06/13/2021   NA 140 06/13/2021   K 4.2 06/13/2021   CL 100 06/13/2021   CREATININE 1.50 (H) 06/13/2021   BUN 16 06/13/2021   CO2 34 (H) 06/13/2021   TSH 4.317 11/15/2020   INR 1.0 07/04/2006   HGBA1C 6.4 11/29/2020   MICROALBUR 1.0 04/10/2015     Assessment & Plan:    See Problem List for Assessment and Plan of chronic medical problems.

## 2021-08-28 NOTE — Patient Instructions (Addendum)
     Blood work was ordered.  Have a chest xray today.    Medications changes include :   mounjaro 12. 5 mg weekly   Your prescription(s) have been sent to your pharmacy.      Return in about 4 months (around 12/30/2021) for follow up.

## 2021-08-29 ENCOUNTER — Ambulatory Visit: Payer: Medicare PPO | Admitting: Internal Medicine

## 2021-08-29 ENCOUNTER — Ambulatory Visit (INDEPENDENT_AMBULATORY_CARE_PROVIDER_SITE_OTHER)
Admission: RE | Admit: 2021-08-29 | Discharge: 2021-08-29 | Disposition: A | Discharge status: Home / Self Care | Payer: Medicare PPO | Source: Ambulatory Visit | Attending: Internal Medicine | Admitting: Internal Medicine

## 2021-08-29 ENCOUNTER — Other Ambulatory Visit (HOSPITAL_COMMUNITY): Payer: Self-pay

## 2021-08-29 VITALS — BP 120/80 | HR 97 | Temp 98.6°F | Ht 62.0 in | Wt 336.0 lb

## 2021-08-29 DIAGNOSIS — E538 Deficiency of other specified B group vitamins: Secondary | ICD-10-CM

## 2021-08-29 DIAGNOSIS — N1832 Chronic kidney disease, stage 3b: Secondary | ICD-10-CM | POA: Diagnosis not present

## 2021-08-29 DIAGNOSIS — G609 Hereditary and idiopathic neuropathy, unspecified: Secondary | ICD-10-CM | POA: Diagnosis not present

## 2021-08-29 DIAGNOSIS — K219 Gastro-esophageal reflux disease without esophagitis: Secondary | ICD-10-CM

## 2021-08-29 DIAGNOSIS — E1140 Type 2 diabetes mellitus with diabetic neuropathy, unspecified: Secondary | ICD-10-CM | POA: Diagnosis not present

## 2021-08-29 DIAGNOSIS — R600 Localized edema: Secondary | ICD-10-CM

## 2021-08-29 DIAGNOSIS — R0989 Other specified symptoms and signs involving the circulatory and respiratory systems: Secondary | ICD-10-CM

## 2021-08-29 DIAGNOSIS — M170 Bilateral primary osteoarthritis of knee: Secondary | ICD-10-CM

## 2021-08-29 DIAGNOSIS — G8929 Other chronic pain: Secondary | ICD-10-CM

## 2021-08-29 DIAGNOSIS — I10 Essential (primary) hypertension: Secondary | ICD-10-CM | POA: Diagnosis not present

## 2021-08-29 DIAGNOSIS — M109 Gout, unspecified: Secondary | ICD-10-CM

## 2021-08-29 DIAGNOSIS — Z86718 Personal history of other venous thrombosis and embolism: Secondary | ICD-10-CM

## 2021-08-29 DIAGNOSIS — I5032 Chronic diastolic (congestive) heart failure: Secondary | ICD-10-CM | POA: Diagnosis not present

## 2021-08-29 DIAGNOSIS — E782 Mixed hyperlipidemia: Secondary | ICD-10-CM

## 2021-08-29 DIAGNOSIS — J811 Chronic pulmonary edema: Secondary | ICD-10-CM | POA: Diagnosis not present

## 2021-08-29 LAB — COMPREHENSIVE METABOLIC PANEL WITH GFR
ALT: 14 U/L (ref 0–35)
AST: 15 U/L (ref 0–37)
Albumin: 3.9 g/dL (ref 3.5–5.2)
Alkaline Phosphatase: 101 U/L (ref 39–117)
BUN: 19 mg/dL (ref 6–23)
CO2: 36 meq/L — ABNORMAL HIGH (ref 19–32)
Calcium: 9.7 mg/dL (ref 8.4–10.5)
Chloride: 96 meq/L (ref 96–112)
Creatinine, Ser: 1.73 mg/dL — ABNORMAL HIGH (ref 0.40–1.20)
GFR: 29.42 mL/min — ABNORMAL LOW
Glucose, Bld: 82 mg/dL (ref 70–99)
Potassium: 4.4 meq/L (ref 3.5–5.1)
Sodium: 137 meq/L (ref 135–145)
Total Bilirubin: 0.6 mg/dL (ref 0.2–1.2)
Total Protein: 7 g/dL (ref 6.0–8.3)

## 2021-08-29 LAB — CBC WITH DIFFERENTIAL/PLATELET
Basophils Absolute: 0.1 K/uL (ref 0.0–0.1)
Basophils Relative: 1.1 % (ref 0.0–3.0)
Eosinophils Absolute: 0.1 K/uL (ref 0.0–0.7)
Eosinophils Relative: 1.6 % (ref 0.0–5.0)
HCT: 40.6 % (ref 36.0–46.0)
Hemoglobin: 12.6 g/dL (ref 12.0–15.0)
Lymphocytes Relative: 20.1 % (ref 12.0–46.0)
Lymphs Abs: 1.1 K/uL (ref 0.7–4.0)
MCHC: 31.1 g/dL (ref 30.0–36.0)
MCV: 96.4 fl (ref 78.0–100.0)
Monocytes Absolute: 0.4 K/uL (ref 0.1–1.0)
Monocytes Relative: 7.9 % (ref 3.0–12.0)
Neutro Abs: 3.7 K/uL (ref 1.4–7.7)
Neutrophils Relative %: 69.3 % (ref 43.0–77.0)
Platelets: 150 K/uL (ref 150.0–400.0)
RBC: 4.21 Mil/uL (ref 3.87–5.11)
RDW: 15.9 % — ABNORMAL HIGH (ref 11.5–15.5)
WBC: 5.4 K/uL (ref 4.0–10.5)

## 2021-08-29 LAB — HEMOGLOBIN A1C: Hgb A1c MFr Bld: 6.2 % (ref 4.6–6.5)

## 2021-08-29 LAB — MICROALBUMIN / CREATININE URINE RATIO
Creatinine,U: 139.1 mg/dL
Microalb Creat Ratio: 1.3 mg/g (ref 0.0–30.0)
Microalb, Ur: 1.8 mg/dL (ref 0.0–1.9)

## 2021-08-29 LAB — LIPID PANEL
Cholesterol: 157 mg/dL (ref 0–200)
HDL: 35.2 mg/dL — ABNORMAL LOW
LDL Cholesterol: 106 mg/dL — ABNORMAL HIGH (ref 0–99)
NonHDL: 122.01
Total CHOL/HDL Ratio: 4
Triglycerides: 82 mg/dL (ref 0.0–149.0)
VLDL: 16.4 mg/dL (ref 0.0–40.0)

## 2021-08-29 LAB — BRAIN NATRIURETIC PEPTIDE: Pro B Natriuretic peptide (BNP): 22 pg/mL (ref 0.0–100.0)

## 2021-08-29 MED ORDER — CYANOCOBALAMIN 1000 MCG/ML IJ SOLN
1000.0000 ug | Freq: Once | INTRAMUSCULAR | Status: AC
  Administered 2021-08-29: 1000 ug via INTRAMUSCULAR

## 2021-08-29 MED ORDER — TIRZEPATIDE 12.5 MG/0.5ML ~~LOC~~ SOAJ
12.5000 mg | SUBCUTANEOUS | 3 refills | Status: DC
  Filled 2021-08-29: qty 2, 28d supply, fill #0

## 2021-08-29 NOTE — Assessment & Plan Note (Signed)
Chronic Currently on Mounjaro-we will increase dose to 12.5 mg weekly She understands how important weight loss is for her medical problems Encourage as much activity as possible Healthy diet, small portions, low in sugars and carbs

## 2021-08-29 NOTE — Assessment & Plan Note (Signed)
Chronic Monthly B12 injections-we will continue B12 injection today

## 2021-08-29 NOTE — Assessment & Plan Note (Signed)
Chronic Continue gabapentin 100 mg 3 times daily and Trileptal 300 mg twice daily Continue oxycodone 5 mg every 6 hours as needed for severe pain

## 2021-08-29 NOTE — Assessment & Plan Note (Addendum)
Chronic There is an element of fluid overload today and she is symptomatic BNP, chest x-ray checked-BNP is normal, chest x-ray does show some vascular congestion Increase torsemide 40 mg twice daily to 40 mg 3 times daily for 1-2 days depending on response Stressed low-sodium diet Call if no improvement

## 2021-08-29 NOTE — Assessment & Plan Note (Signed)
Chronic Has chronic bilateral knee osteoarthritis that is severe and peripheral neuropathy Taking oxycodone 5 mg every 6 hours as needed for severe pain only-she is taking medication appropriately and not taking on a regular basis Continue above

## 2021-08-29 NOTE — Assessment & Plan Note (Signed)
History of DVT 05/2020 Very sedentary and high risk of recurrence of DVT Continue Eliquis 2.5 mg twice daily for prevention

## 2021-08-29 NOTE — Assessment & Plan Note (Addendum)
Acute She has had some increased shortness of breath, cough, increased leg edema and increased weight Chest x-ray shows pulmonary vascular congestion, but BNP is normal She is currently taking torsemide 40 mg twice daily-we will increase to 40 mg 3 times a day x 1-2 days depending on response to see if that helps-monitor fluid status

## 2021-08-29 NOTE — Assessment & Plan Note (Signed)
Chronic Regular exercise and healthy diet encouraged Check lipid panel  Continue atorvastatin 40 mg daily 

## 2021-08-29 NOTE — Assessment & Plan Note (Signed)
Chronic Blood pressure well controlled CMP Continue Bystolic 5 mg daily, spironolactone 25 mg daily, torsemide 40 mg twice daily

## 2021-08-29 NOTE — Assessment & Plan Note (Signed)
Chronic , Controlled Continue torsemide 40 mg twice daily and spironolactone 25 mg daily

## 2021-08-29 NOTE — Assessment & Plan Note (Signed)
Chronic Severe Following with orthopedics Had recent injections which helped in 1 knee only Continue oxycodone 5 mg every 6 hours as needed for severe pain only

## 2021-08-29 NOTE — Assessment & Plan Note (Signed)
Chronic GERD controlled Continue famotidine 20 mg twice daily 

## 2021-08-29 NOTE — Assessment & Plan Note (Signed)
Chronic  Lab Results  Component Value Date   HGBA1C 6.2 08/29/2021   Sugars well controlled Check A1c, urine microalbumin today Continue Mounjaro, but increase dose to 12.5 mg weekly Stressed regular exercise, diabetic diet

## 2021-08-29 NOTE — Assessment & Plan Note (Signed)
Chronic Following with nephrology CMP 

## 2021-09-01 ENCOUNTER — Telehealth: Payer: Self-pay

## 2021-09-02 ENCOUNTER — Other Ambulatory Visit (HOSPITAL_COMMUNITY): Payer: Self-pay

## 2021-09-02 ENCOUNTER — Telehealth: Payer: Self-pay

## 2021-09-02 ENCOUNTER — Telehealth (HOSPITAL_COMMUNITY): Payer: Self-pay | Admitting: *Deleted

## 2021-09-02 NOTE — Telephone Encounter (Signed)
Recommendations given to patient

## 2021-09-02 NOTE — Telephone Encounter (Signed)
Pt added on to APP schedule for weight gain see below.   Marcina Millard, CMA to [name], MD       09/02/21 10:21 AM Spoke with patient today. She wanted to let you know today her weight was up 341 today.   7/28-/23 - 09/01/21  she was 336 lbs  08/31/21 she was 335 lbs   She took 6 pills of the torsemide Sat, Sun and Monday.   None today but wants to be advised regarding her jump in weight gain.

## 2021-09-02 NOTE — Telephone Encounter (Signed)
Spoke to pt regarding lab results

## 2021-09-02 NOTE — Telephone Encounter (Signed)
She needs to contact her cardiologist I think she needs to follow-up with them

## 2021-09-03 ENCOUNTER — Encounter (HOSPITAL_COMMUNITY): Payer: Self-pay

## 2021-09-03 ENCOUNTER — Ambulatory Visit (HOSPITAL_COMMUNITY)
Admission: RE | Admit: 2021-09-03 | Discharge: 2021-09-03 | Disposition: A | Discharge status: Home / Self Care | Payer: Medicare PPO | Source: Ambulatory Visit | Attending: Family Medicine | Admitting: Family Medicine

## 2021-09-03 VITALS — BP 110/66 | HR 94 | Wt 342.2 lb

## 2021-09-03 DIAGNOSIS — M25512 Pain in left shoulder: Secondary | ICD-10-CM | POA: Diagnosis not present

## 2021-09-03 DIAGNOSIS — Z79899 Other long term (current) drug therapy: Secondary | ICD-10-CM | POA: Insufficient documentation

## 2021-09-03 DIAGNOSIS — E785 Hyperlipidemia, unspecified: Secondary | ICD-10-CM | POA: Diagnosis not present

## 2021-09-03 DIAGNOSIS — J9611 Chronic respiratory failure with hypoxia: Secondary | ICD-10-CM | POA: Insufficient documentation

## 2021-09-03 DIAGNOSIS — R6881 Early satiety: Secondary | ICD-10-CM | POA: Diagnosis not present

## 2021-09-03 DIAGNOSIS — N1832 Chronic kidney disease, stage 3b: Secondary | ICD-10-CM | POA: Diagnosis not present

## 2021-09-03 DIAGNOSIS — I825Z3 Chronic embolism and thrombosis of unspecified deep veins of distal lower extremity, bilateral: Secondary | ICD-10-CM

## 2021-09-03 DIAGNOSIS — E1122 Type 2 diabetes mellitus with diabetic chronic kidney disease: Secondary | ICD-10-CM | POA: Insufficient documentation

## 2021-09-03 DIAGNOSIS — Z86718 Personal history of other venous thrombosis and embolism: Secondary | ICD-10-CM | POA: Insufficient documentation

## 2021-09-03 DIAGNOSIS — R42 Dizziness and giddiness: Secondary | ICD-10-CM | POA: Insufficient documentation

## 2021-09-03 DIAGNOSIS — J45909 Unspecified asthma, uncomplicated: Secondary | ICD-10-CM | POA: Diagnosis not present

## 2021-09-03 DIAGNOSIS — I130 Hypertensive heart and chronic kidney disease with heart failure and stage 1 through stage 4 chronic kidney disease, or unspecified chronic kidney disease: Secondary | ICD-10-CM | POA: Insufficient documentation

## 2021-09-03 DIAGNOSIS — Z7901 Long term (current) use of anticoagulants: Secondary | ICD-10-CM | POA: Insufficient documentation

## 2021-09-03 DIAGNOSIS — I272 Pulmonary hypertension, unspecified: Secondary | ICD-10-CM | POA: Diagnosis not present

## 2021-09-03 DIAGNOSIS — I2510 Atherosclerotic heart disease of native coronary artery without angina pectoris: Secondary | ICD-10-CM | POA: Diagnosis not present

## 2021-09-03 DIAGNOSIS — I5033 Acute on chronic diastolic (congestive) heart failure: Secondary | ICD-10-CM | POA: Insufficient documentation

## 2021-09-03 LAB — BASIC METABOLIC PANEL WITH GFR
Anion gap: 6 (ref 5–15)
BUN: 15 mg/dL (ref 8–23)
CO2: 38 mmol/L — ABNORMAL HIGH (ref 22–32)
Calcium: 10.2 mg/dL (ref 8.9–10.3)
Chloride: 96 mmol/L — ABNORMAL LOW (ref 98–111)
Creatinine, Ser: 1.67 mg/dL — ABNORMAL HIGH (ref 0.44–1.00)
GFR, Estimated: 33 mL/min — ABNORMAL LOW
Glucose, Bld: 100 mg/dL — ABNORMAL HIGH (ref 70–99)
Potassium: 4.8 mmol/L (ref 3.5–5.1)
Sodium: 140 mmol/L (ref 135–145)

## 2021-09-03 LAB — BRAIN NATRIURETIC PEPTIDE: B Natriuretic Peptide: 34.2 pg/mL (ref 0.0–100.0)

## 2021-09-03 MED ORDER — POTASSIUM CHLORIDE CRYS ER 20 MEQ PO TBCR: EXTENDED_RELEASE_TABLET | ORAL | 3 refills | Status: DC

## 2021-09-03 MED ORDER — METOLAZONE 2.5 MG PO TABS: 2.5000 mg | ORAL_TABLET | ORAL | 3 refills | Status: DC

## 2021-09-03 NOTE — Patient Instructions (Addendum)
Labs done today. We will contact you only if your labs are abnormal.  START Metoalzone 2.'5mg'$  (1 tablet) by mouth every Wednesday.   On Wednesdays take Potassium 3mq(3 tablets) by mouth daily.   No other medication changes were made. Please continue all current medications as prescribed.  Your physician recommends that you schedule a follow-up appointment in: 1 week for a lab only appointment and in 3-4 weeks with our NP/[facility] here in our office.   If you have any questions or concerns before your next appointment please send [place] message through [place] call our office at [phone]    TO LEAVE A MESSAGE FOR THE NURSE SELECT OPTION 2, PLEASE LEAVE A MESSAGE INCLUDING: YOUR NAME DATE OF BIRTH CALL BACK NUMBER REASON FOR CALL**this is important as we prioritize the call backs  YOU WILL RECEIVE A CALL BACK THE SAME DAY AS LONG AS YOU CALL BEFORE 4:00 PM   Do the following things EVERYDAY: Weigh yourself in the morning before breakfast. Write it down and keep it in a log. Take your medicines as prescribed Eat low salt foods--Limit salt (sodium) to 2000 mg per day.  Stay as active as you can everyday Limit all fluids for the day to less than 2 liters   At the [facility] you and your health needs are our priority. As part of our continuing mission to provide you with exceptional heart care, we have created designated Provider Care Teams. These Care Teams include your primary Cardiologist (physician) and Advanced Practice Providers (APPs- Physician Assistants and Nurse Practitioners) who all work together to provide you with the care you need, when you need it.   You may see any of the following providers on your designated Care Team at your next follow up: Dr DGlori BickersDr DHaynes Kerns NP BLyda Jester PUtahLAudry Riles PharmD   Please be sure to bring in all your medications bottles to every appointment.

## 2021-09-03 NOTE — Progress Notes (Signed)
Advanced Heart Failure Clinic Note   PCP: [name], MD PCP-Cardiologist: None  HF Cardiologist: Dr. Haroldine Laws  Reason for visit: f/u for chronic diastolic heart failure   HPI: [name] is a 71 y.o. woman with history of morbid obesity Body mass index is 65.23 kg/m., DM2, HTN, HLD, mild-to- moderate CAD, asthma, and chronic dHF.  Cath 2/08: 50-60% LAD lesion and 30% RCA lesion.  EF 70%.  Mild pulmonary HTN primarily due to high output state.  PA mean 27 PCWP 10 PVR 1.8   Admitted 4/22 for HF Echo EF 60-65%. RV normal. She was diuresed and placed back on PO diuretics, switched from lasix 80 bid to torsemide 60 mg d/c. D/c wt 371 lb.   She saw pulmonology for OSA 10/02/20, sleep study ordered, low dose Lunesta added.  10/22 admitted with concerns of CO2 retention and HF Underwent RHC showing mild to moderate pulmonary hypertension, minimally elevated PCWP (18) and normal CO.  Sleep study 11/22 showed no OSA but + nocturnal desaturations to 84%, ONO ordered.  Follow up 2/23, fluid up and down. SOB with mild activity. Volume OK.  Says she is "making it". Fluid up and down.   Today she returns for acute visit with c/o weight gain and dyspnea. Saw PCP and CXR showed mild vascular congestion and was advised to increase torsemide to 60 bid x 1-2 days. She has SOB with minimal activity. Legs swelling and has early satiety. Occasional dizziness and left arm/shoulder pain, improves with Tylenol. Husband basically provides all her care. Denies palpitations, or abnormal bleeding. Chronically sleeps in recliner. Appetite fair. No fever or chills. Weight at home 342 pounds on her scale. Taking all medications. Eats take out or fast food daily, limits fluids to less than 2 L/day.  Cardiac Studies: - RHC (10/22):  RA = 11 RV = 68/12 PA = 69/22 (39) PCW = 18 Fick cardiac output/index = 7.0/2.8 PVR = 3.0 WU Ao sat = 96% PA sat = 61%, 62% SVC sat 69% PAPi = 4.2  - Echo (10/22): EF  65-70%, grade I DD, RV ok  - Echo 4/22: EF 60-65%. RV ok.   ROS: All systems reviewed and negative except as per HPI.   Past Medical History:  Diagnosis Date   Anemia    Asthma    CAD (coronary artery disease)    Carpal tunnel syndrome    Cellulitis of both lower extremities 04/11/2015   CHF (congestive heart failure) (HCC)    Chronic kidney disease    [facility]- followed by [facility]   Colon polyp, hyperplastic 3007 & 6226   Complication of anesthesia 1999   svt with renal calculi surgery, no problems since   CTS (carpal tunnel syndrome)    bilateral   Diabetes mellitus    Eczema    GERD (gastroesophageal reflux disease)    History of kidney stones 1999   Hyperlipidemia    Hypertension    Leg ulcer ([place]) 04/24/2015   Right lateral leg No evidence of an infection Monitor closely Keep edema controlled    Leg ulcer (HCC)    right lower   Meralgia paresthetica    Dr. [name]   Morbid obesity ([place])    Neuropathy    toes and legs   Osteoarthrosis, unspecified whether generalized or localized, lower leg    knee   PUD (peptic ulcer disease)    Shortness of breath dyspnea    with exertion   Sleep apnea    per  progress note 02/25/2018   Type II or unspecified type diabetes mellitus without mention of complication, not stated as uncontrolled    Unspecified hereditary and idiopathic peripheral neuropathy    Urticaria    Vitamin B12 deficiency    Wears glasses    Wound cellulitis    right upper leg, healing well   Current Outpatient Medications  Medication Sig Dispense Refill   acetaminophen (TYLENOL) 650 MG CR tablet Take 1,300 mg by mouth every 8 (eight) hours as needed for pain.     Alcohol Swabs (DROPSAFE ALCOHOL PREP) 70 % PADS USE TOPICALLY AS DIRECTED 300 each 3   allopurinol (ZYLOPRIM) 100 MG tablet TAKE 2 TABLETS(200 MG) BY MOUTH DAILY 180 tablet 1   apixaban (ELIQUIS) 2.5 MG TABS tablet Take 1 tablet (2.5 mg total) by mouth 2 (two) times daily. 180 tablet 1    atorvastatin (LIPITOR) 40 MG tablet Take 1 tablet (40 mg total) by mouth at bedtime. 90 tablet 2   Blood Glucose Calibration (TRUE METRIX LEVEL 1) Low SOLN UAD  E11.9 1 each 3   Blood Glucose Monitoring Suppl (TRUE METRIX METER) DEVI True Metrix Level 1 solution     Budeson-Glycopyrrol-Formoterol (BREZTRI AEROSPHERE) 160-9-4.8 MCG/ACT AERO Inhale 2 puffs into the lungs in the morning and at bedtime. 10.7 g 0   calcium carbonate (TUMS - DOSED IN MG ELEMENTAL CALCIUM) 500 MG chewable tablet Chew 2 tablets by mouth daily as needed for indigestion or heartburn.     cyanocobalamin (,VITAMIN B-12,) 1000 MCG/ML injection INJECT 1ML INTO MUSCLE EVERY 30 DAYS 10 mL 5   dapagliflozin propanediol (FARXIGA) 10 MG TABS tablet Take 10 mg by mouth daily.     diclofenac Sodium (VOLTAREN) 1 % GEL Apply 1 application topically as needed (pain).     diphenhydrAMINE (BENADRYL) 12.5 MG/5ML liquid Take 25 mg by mouth 4 (four) times daily as needed for allergies.     eszopiclone (LUNESTA) 1 MG TABS tablet Take 1 mg by mouth at bedtime as needed for sleep. Take immediately before bedtime     famotidine (PEPCID) 20 MG tablet Take 1 tablet (20 mg total) by mouth 2 (two) times daily. 180 tablet 3   FEROSUL 325 (65 Fe) MG tablet TAKE 1 TABLET BY MOUTH DAILY ON(MONDAYS WEDNESDAY AND FRIDAYS) 60 tablet 0   fluticasone (FLONASE) 50 MCG/ACT nasal spray Place 2 sprays into both nostrils daily. 18.2 mL 2   gabapentin (NEURONTIN) 100 MG capsule TAKE 1 CAPSULE(100 MG) BY MOUTH THREE TIMES DAILY 90 capsule 1   guaiFENesin-dextromethorphan (ROBITUSSIN DM) 100-10 MG/5ML syrup Take 5 mLs by mouth every 4 (four) hours as needed for cough.     HYDROcodone bit-homatropine (HYCODAN) 5-1.5 MG/5ML syrup Take 5 mLs by mouth every 8 (eight) hours as needed for cough. 120 mL 0   hydrOXYzine (ATARAX/VISTARIL) 25 MG tablet Take 1 tablet (25 mg total) by mouth 3 (three) times daily as needed for itching. 30 tablet 0   loratadine (CLARITIN) 10 MG  tablet Take 1 tablet (10 mg total) by mouth daily. 30 tablet 5   MITIGARE 0.6 MG CAPS Take by mouth.     montelukast (SINGULAIR) 10 MG tablet TAKE 1 TABLET(10 MG) BY MOUTH AT BEDTIME 30 tablet 5   nebivolol (BYSTOLIC) 5 MG tablet TAKE 1 TABLET(5 MG) BY MOUTH DAILY 30 tablet 3   ondansetron (ZOFRAN) 4 MG tablet Take 1 tablet (4 mg total) by mouth every 8 (eight) hours as needed for nausea or vomiting. 20 tablet 0  Oxcarbazepine (TRILEPTAL) 300 MG tablet Take 1 tablet (300 mg total) by mouth 2 (two) times daily. 180 tablet 1   oxyCODONE (OXY IR/ROXICODONE) 5 MG immediate release tablet Take 1 tablet (5 mg total) by mouth every 6 (six) hours as needed for severe pain. 30 tablet 0   polyethylene glycol (MIRALAX / GLYCOLAX) 17 g packet Take 17 g by mouth daily as needed for moderate constipation.     potassium chloride SA (KLOR-CON M) 20 MEQ tablet TAKE 1 TABLET BY MOUTH DAILY 90 tablet 1   Spacer/Aero-Holding Dorise Bullion Use with inhaler. 1 each 2   spironolactone (ALDACTONE) 25 MG tablet TAKE 1 TABLET(25 MG) BY MOUTH DAILY 90 tablet 0   tirzepatide (MOUNJARO) 12.5 MG/0.5ML Pen Inject 12.5 mg into the skin once a week. 2 mL 3   torsemide (DEMADEX) 20 MG tablet Take 2 tablets (40 mg total) by mouth 2 (two) times daily. 360 tablet 2   triamcinolone ointment (KENALOG) 0.5 % Apply 1 Application topically as needed (knee pain).     VENTOLIN HFA 108 (90 Base) MCG/ACT inhaler Inhale 2 puffs into the lungs every 4 (four) hours as needed for wheezing.     No current facility-administered medications for this encounter.   Allergies  Allergen Reactions   Penicillins Rash and Other (See Comments)    She was told not to take it anymore. Has patient had a PCN reaction causing immediate rash, facial/tongue/throat swelling, SOB or lightheadedness with hypotension: Yes Has patient had a PCN reaction causing severe rash involving mucus membranes or skin necrosis: No Has patient had a PCN reaction that required  hospitalization: No Has patient had a PCN reaction occurring within the last 10 years: No If all of the above answers are "NO", then may proceed with Cephalosporin use.'   Shellfish Allergy Anaphylaxis   Sulfonamide Derivatives Anaphylaxis and Other (See Comments)   Hydrochlorothiazide W-Triamterene Other (See Comments)    Hypokalemia   Lotensin [Benazepril Hcl] Hives   Dipyridamole Other (See Comments)    Unknown reaction   Estrogens Other (See Comments)    Unknown reaction   Hydrochlorothiazide Other (See Comments)    Unknown reaction, Hypokalemia?   Latex Other (See Comments)    Unknown reaction - stated previously during surgery gloves had to be changed, but unsure if it is still an allergy or not.    Metronidazole Other (See Comments)    Unknown reaction   Other Itching    PICKLES   Spironolactone Other (See Comments)    UNSPECIFIED > "kidney problems"   Torsemide Other (See Comments)    Unknown reaction   Valsartan Other (See Comments)    Unknown reaction   Mustard [Allyl Isothiocyanate] Itching   Social History   Socioeconomic History   Marital status: Married    Spouse name: Dwight   Number of children: 1   Years of education: BS   Highest education level: Not on file  Occupational History   Occupation: Disabled    Employer: RETIRED  Tobacco Use   Smoking status: Never   Smokeless tobacco: Never  Vaping Use   Vaping Use: Never used  Substance and Sexual Activity   Alcohol use: No    Alcohol/week: 0.0 standard drinks of alcohol   Drug use: No   Sexual activity: Not Currently  Other Topics Concern   Not on file  Social History Narrative   Patient lives at home with her husband [name] 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    Caffeine - some times.   Right handed.   Social Determinants of Health   Financial Resource Strain: Not on file  Food Insecurity: Not on file  Transportation Needs: Not on file  Physical Activity: Not on file   Stress: Not on file  Social Connections: Not on file  Intimate Partner Violence: Not on file   Family History  Problem Relation Age of Onset   Colon cancer Mother    Prostate cancer Father    Colon cancer Father    Diabetes Maternal Aunt    Breast cancer Maternal Aunt    Diabetes Maternal Uncle    Diabetes Paternal Aunt    Stroke Paternal Aunt        > 10   Heart disease Paternal Aunt    Diabetes Paternal Uncle    Breast cancer Maternal Aunt         X 2   Breast cancer Cousin    BP 110/66   Pulse 94   Wt (!) 155.2 kg (342 lb 3.2 oz)   SpO2 96% Comment: Patient is on 2-liters of room oxygen.  BMI 62.59 kg/m   Wt Readings from Last 3 Encounters:  09/03/21 (!) 155.2 kg (342 lb 3.2 oz)  08/29/21 (!) 152.4 kg (336 lb)  08/15/21 (!) 148.8 kg (328 lb)   PHYSICAL EXAM: General:  NAD. No resp difficulty, arrived in [facility] on 2L oxygen HEENT: Normal Neck: Supple. JVP 7-8. Carotids 2+ bilat; no bruits. No lymphadenopathy or thryomegaly appreciated. Cor: PMI nondisplaced. Regular rate & rhythm. No rubs, gallops or murmurs. Lungs: Faint crackles RLL Abdomen: Obese, nontender, nondistended. No hepatosplenomegaly. No bruits or masses. Good bowel sounds. Extremities: No cyanosis, clubbing, rash, 1+ BLE edema to knees, R>L (? Chronic lymphedema) Neuro: Alert & oriented x 3, cranial nerves grossly intact. Moves all 4 extremities w/o difficulty. Affect pleasant.  ASSESSMENT & PLAN: 1. Acute on Chronic Diastolic Heart Failure  - RHC 2008 c/w mild pulmonary hypertension, primarily due to high output state.  Her mean pulmonary artery pressure was 27, wedge pressure was 10. PVR was 1.80 Woods units. - Echo (2017): LVEF 65-70%, G1DD, RV normal - Recent admit for a/c dCHF. Volume improved w/ IV Lasix.  - Echo (4/22): LVEF 60-65%, G2DD  - Echo (10/22): EF 65%, Grade 1 DD, RV normal. - RHC (10/22): w/ mild-mod PH, minimally elevated PCWP (18) - NYHA IIIb, functional status difficult due to body  habitus, asthma/chronic resp failure, and physical deconditioning.  - Exam difficult for volume, but with mild vascular congestion on recent CXR and worsening dyspnea, suspect she is at least mildly volume up. - Place TED - Add metolazone 2.5 mg + extra 40 KCL every Wednesday.  - Continue torsemide 40 mg bid. - Continue Farxiga 10 mg daily. - Continue spiro 25 mg daily.  - Continue Bystolic 5 mg daily  - Labs today, BMET in 1 week. - If renal function significantly worsens, cut back on metolazone and push torsemide. - Could consider Furoscix, but would like to hold of with CKD and limited mobility.  2. Chronic Hypoxic Respiratory Failure - Suspect driven by OHS, split night sleep study showed no OSA - Continue BiPAP qhs & continue oxygen during the day. - RHC (10/22): c/w mid-mod PH.  - Respiratory status stable. - Continue Oxygen.   3. Bilateral LE DVT (Diagnosed 4/22) - Confirmed by venous dopplers. Chest CT negative for PE. - Now on Eliquis. No bleeding issues.   4.  CAD:  - LHC in 2008 showed 50-60% LAD lesion and 30% RCA lesion. - No  s/s angina - Continue ? blocker and statin. - No ASA given Eliquis for DVT tx.    5. Stage IIIb CKD - Baseline SCr ~1.5. Labs stable 12/22 - Followed by Dr. [name] at [facility]. - Follow renal function with increase in diuretics.  Follow up with APP in 3-4 weeks (consider Furoscix) and keep follow up with Dr. Haroldine Laws, as scheduled.  Rafael Bihari, FNP  10:07 AM

## 2021-09-11 ENCOUNTER — Ambulatory Visit: Payer: Self-pay | Admitting: Licensed Clinical Social Worker

## 2021-09-11 NOTE — Patient Instructions (Signed)
Visit Information  Thank you for taking time to visit with me today. Please don't hesitate to contact me if I can be of assistance to you.   Following are the goals we discussed today: Managing your health risk  Goals Addressed             This Visit's Progress    COMPLETED: Care Coordination Activities/ No Follow up Required       Care Coordination Interventions: Discussed benefits of Medicare Annual Wellness Visit :            Please call the care guide team at [phone] if you need to schedule an appointment with me.   If you are experiencing a Mental Health or [place] or need someone to talk to, please call [phone] (toll free, 24 hour hotline)   The patient verbalized understanding of instructions, educational materials, and care plan provided today and DECLINED offer to receive copy of patient instructions, educational materials, and care plan.   Next AWV (Annual Wellness Visit) scheduled for: 09/15/2021 No further follow up required: with Care Coordination team  Casimer Lanius, [place] [phone]

## 2021-09-11 NOTE — Patient Outreach (Signed)
  Care Coordination  Initial Visit Note   09/11/2021 Name: April [name] MRN: [id number] DOB: 1950/04/08  April [name] is a 71 y.o. year old female who sees Burns, Claudina Lick, MD for primary care. I spoke with  April [name] by phone today  What matters to the patients health and wellness today?   Patient reports no concerns or needs with health and wellness related to physical or mental heath.  Reviewed benefits of Medicare Annual Wellness Visit.   Recommendation: Patient may benefit from, and is in agreement to Schedule AWV.    Goals Addressed             This Visit's Progress    COMPLETED: Care Coordination Activities/ No Follow up Required       Care Coordination Interventions: Discussed benefits of Medicare Annual Wellness Visit :            SDOH assessments and interventions completed:  Yes  SDOH Interventions Today    Flowsheet Row Most Recent Value  SDOH Interventions   Food Insecurity Interventions Intervention Not Indicated  Housing Interventions Intervention Not Indicated  Transportation Interventions Intervention Not Indicated       Care Coordination Interventions Activated:  Yes  Care Coordination Interventions:  Yes, provided   Follow up plan: No further intervention required. Follow up call scheduled for AWV 09/15/2021    Encounter Outcome:  Pt. Visit Completed   Casimer Lanius, [place] [phone]

## 2021-09-12 ENCOUNTER — Ambulatory Visit (HOSPITAL_COMMUNITY)
Admission: RE | Admit: 2021-09-12 | Discharge: 2021-09-12 | Disposition: A | Discharge status: Home / Self Care | Payer: Medicare PPO | Source: Ambulatory Visit | Attending: Internal Medicine | Admitting: Internal Medicine

## 2021-09-12 ENCOUNTER — Telehealth: Payer: Self-pay | Admitting: Internal Medicine

## 2021-09-12 ENCOUNTER — Telehealth (HOSPITAL_COMMUNITY): Payer: Self-pay | Admitting: *Deleted

## 2021-09-12 DIAGNOSIS — I5032 Chronic diastolic (congestive) heart failure: Secondary | ICD-10-CM

## 2021-09-12 DIAGNOSIS — I5033 Acute on chronic diastolic (congestive) heart failure: Secondary | ICD-10-CM

## 2021-09-12 LAB — BASIC METABOLIC PANEL
Anion gap: 10 (ref 5–15)
BUN: 25 mg/dL — ABNORMAL HIGH (ref 8–23)
CO2: 35 mmol/L — ABNORMAL HIGH (ref 22–32)
Calcium: 9.3 mg/dL (ref 8.9–10.3)
Chloride: 90 mmol/L — ABNORMAL LOW (ref 98–111)
Creatinine, Ser: 2.06 mg/dL — ABNORMAL HIGH (ref 0.44–1.00)
GFR, Estimated: 25 mL/min — ABNORMAL LOW (ref >60)
Glucose, Bld: 110 mg/dL — ABNORMAL HIGH (ref 70–99)
Potassium: 3.3 mmol/L — ABNORMAL LOW (ref 3.5–5.1)
Sodium: 135 mmol/L (ref 135–145)

## 2021-09-12 MED ORDER — OXYCODONE HCL 5 MG PO TABS: 5.0000 mg | ORAL_TABLET | Freq: Four times a day (QID) | ORAL | 0 refills | Status: DC | PRN

## 2021-09-12 MED ORDER — POTASSIUM CHLORIDE CRYS ER 20 MEQ PO TBCR: 20.0000 meq | EXTENDED_RELEASE_TABLET | Freq: Every day | ORAL | 3 refills | Status: DC

## 2021-09-12 MED ORDER — HYDROXYZINE HCL 25 MG PO TABS
25.0000 mg | ORAL_TABLET | Freq: Three times a day (TID) | ORAL | 1 refills | Status: DC | PRN
Start: 2021-09-12 — End: 2022-01-29

## 2021-09-12 NOTE — Telephone Encounter (Signed)
Pt aware, agreeable, and verbalized understanding, repeat lab sch 8/18

## 2021-09-12 NOTE — Telephone Encounter (Signed)
-----   Message from Conrad Clever, NP sent at 09/12/2021 11:07 AM EDT ----- Renal function trending up. Hold torsemide today and tomorrow. Then restart torsemide 40 mg twice a day. Stop metolazone. Continue K dur 20 meq daily.  Repeat BMET in 7 days.  Please call.

## 2021-09-12 NOTE — Telephone Encounter (Signed)
PT's spouse visits today in regards to PT needing RX refills. PT needs a refill on their hydrOXYzine (ATARAX/VISTARIL) 25 MG as well as their oxyCODONE (OXY IR/ROXICODONE) 5 MG.   (PT requested in note left to me the amounts being 80 for the Hydroxyzine, and 30 for the oxyCODONE. But am not sure how these amounts are usually prescribed)  They would like this RX sent out to the [facility] on [address].  CB if needed: [phone]

## 2021-09-15 ENCOUNTER — Ambulatory Visit (INDEPENDENT_AMBULATORY_CARE_PROVIDER_SITE_OTHER): Payer: Medicare PPO

## 2021-09-15 ENCOUNTER — Other Ambulatory Visit: Payer: Self-pay

## 2021-09-15 ENCOUNTER — Telehealth: Payer: Self-pay | Admitting: Internal Medicine

## 2021-09-15 DIAGNOSIS — J9602 Acute respiratory failure with hypercapnia: Secondary | ICD-10-CM | POA: Diagnosis not present

## 2021-09-15 DIAGNOSIS — Z0000 Encounter for general adult medical examination without abnormal findings: Secondary | ICD-10-CM | POA: Diagnosis not present

## 2021-09-15 DIAGNOSIS — J9601 Acute respiratory failure with hypoxia: Secondary | ICD-10-CM | POA: Diagnosis not present

## 2021-09-15 DIAGNOSIS — I5033 Acute on chronic diastolic (congestive) heart failure: Secondary | ICD-10-CM | POA: Diagnosis not present

## 2021-09-15 MED ORDER — ATORVASTATIN CALCIUM 40 MG PO TABS: 40.0000 mg | ORAL_TABLET | Freq: Every day | ORAL | 2 refills | Status: DC

## 2021-09-15 MED ORDER — MONTELUKAST SODIUM 10 MG PO TABS: ORAL_TABLET | ORAL | 5 refills | Status: DC

## 2021-09-15 NOTE — Telephone Encounter (Signed)
Scripts sent in today for patient.

## 2021-09-15 NOTE — Telephone Encounter (Signed)
Caller & Relationship to patient: Jozey - self  Call back number: [phone]  Date of last office visit: 08-29-21  Date of next office visit: 12-30-21  Medication(s) to be refilled: atorvastatin (LIPITOR) 40 MG tablet  montelukast (SINGULAIR) 10 MG tablet   Preferred Pharmacy: [facility], [address] AT [address]

## 2021-09-15 NOTE — Progress Notes (Signed)
Subjective:   April [name] is a 71 y.o. female who presents for Medicare Annual (Subsequent) preventive examination.  I connected with Ilsa today by telephone and verified that I am speaking with the correct person using two identifiers. I discussed the limitations, risks, security and privacy concerns of performing an evaluation and management service by telephone and the availability of in person appointments. I also discussed with the patient that there may be a patient responsible charge related to this service. The patient expressed understanding and agreed to proceed. Location patient: home Location provider: [place] Persons participating in the visit: Jordyn and Jillene Bucks, CMA.  Time Spent with patient on telephone encounter: 40 mins  Review of Systems    No ROS. Medicare Wellness Telephone Visit. Additional risk factors are reflected in social history. Cardiac Risk Factors include: advanced age (>13mn, >>61women);diabetes mellitus;sedentary lifestyle     Objective:    There were no vitals filed for this visit. There is no height or weight on file to calculate BMI.     09/15/2021    2:12 PM 03/20/2021    7:05 AM 12/10/2020   10:42 AM 12/06/2020   11:18 PM 11/15/2020   12:48 PM 10/02/2020    9:29 AM 05/07/2020    5:55 PM  Advanced Directives  Does Patient Have a Medical Advance Directive? Yes Yes Yes No No No No  Type of ACorporate treasurerof [place] will       Does patient want to make changes to medical advance directive? No - Patient declined        Copy of [place] Chart?  Yes - validated most recent copy scanned in chart (See row information)       Would patient like information on creating a medical advance directive?    No - Patient declined No - Guardian declined No - Patient declined     Current Medications (verified) Outpatient Encounter Medications as of 09/15/2021  Medication Sig    acetaminophen (TYLENOL) 650 MG CR tablet Take 1,300 mg by mouth every 8 (eight) hours as needed for pain.   Alcohol Swabs (DROPSAFE ALCOHOL PREP) 70 % PADS USE TOPICALLY AS DIRECTED   allopurinol (ZYLOPRIM) 100 MG tablet TAKE 2 TABLETS(200 MG) BY MOUTH DAILY   apixaban (ELIQUIS) 2.5 MG TABS tablet Take 1 tablet (2.5 mg total) by mouth 2 (two) times daily.   atorvastatin (LIPITOR) 40 MG tablet Take 1 tablet (40 mg total) by mouth at bedtime.   Blood Glucose Calibration (TRUE METRIX LEVEL 1) Low SOLN UAD  E11.9   Blood Glucose Monitoring Suppl (TRUE METRIX METER) DEVI True Metrix Level 1 solution   Budeson-Glycopyrrol-Formoterol (BREZTRI AEROSPHERE) 160-9-4.8 MCG/ACT AERO Inhale 2 puffs into the lungs in the morning and at bedtime.   calcium carbonate (TUMS - DOSED IN MG ELEMENTAL CALCIUM) 500 MG chewable tablet Chew 2 tablets by mouth daily as needed for indigestion or heartburn.   cyanocobalamin (,VITAMIN B-12,) 1000 MCG/ML injection INJECT 1ML INTO MUSCLE EVERY 30 DAYS   dapagliflozin propanediol (FARXIGA) 10 MG TABS tablet Take 10 mg by mouth daily.   diclofenac Sodium (VOLTAREN) 1 % GEL Apply 1 application topically as needed (pain).   diphenhydrAMINE (BENADRYL) 12.5 MG/5ML liquid Take 25 mg by mouth 4 (four) times daily as needed for allergies.   eszopiclone (LUNESTA) 1 MG TABS tablet Take 1 mg by mouth at bedtime as needed for sleep. Take immediately before bedtime   famotidine (  PEPCID) 20 MG tablet Take 1 tablet (20 mg total) by mouth 2 (two) times daily.   FEROSUL 325 (65 Fe) MG tablet TAKE 1 TABLET BY MOUTH DAILY ON(MONDAYS WEDNESDAY AND FRIDAYS)   fluticasone (FLONASE) 50 MCG/ACT nasal spray Place 2 sprays into both nostrils daily.   gabapentin (NEURONTIN) 100 MG capsule TAKE 1 CAPSULE(100 MG) BY MOUTH THREE TIMES DAILY   guaiFENesin-dextromethorphan (ROBITUSSIN DM) 100-10 MG/5ML syrup Take 5 mLs by mouth every 4 (four) hours as needed for cough.   hydrOXYzine (ATARAX) 25 MG tablet Take 1  tablet (25 mg total) by mouth 3 (three) times daily as needed for itching.   loratadine (CLARITIN) 10 MG tablet Take 1 tablet (10 mg total) by mouth daily.   MITIGARE 0.6 MG CAPS Take by mouth.   montelukast (SINGULAIR) 10 MG tablet TAKE 1 TABLET(10 MG) BY MOUTH AT BEDTIME   nebivolol (BYSTOLIC) 5 MG tablet TAKE 1 TABLET(5 MG) BY MOUTH DAILY   ondansetron (ZOFRAN) 4 MG tablet Take 1 tablet (4 mg total) by mouth every 8 (eight) hours as needed for nausea or vomiting.   Oxcarbazepine (TRILEPTAL) 300 MG tablet Take 1 tablet (300 mg total) by mouth 2 (two) times daily.   oxyCODONE (OXY IR/ROXICODONE) 5 MG immediate release tablet Take 1 tablet (5 mg total) by mouth every 6 (six) hours as needed for severe pain (Chronic knee osteoarthritis).   polyethylene glycol (MIRALAX / GLYCOLAX) 17 g packet Take 17 g by mouth daily as needed for moderate constipation.   potassium chloride SA (KLOR-CON M) 20 MEQ tablet Take 1 tablet (20 mEq total) by mouth daily.   Spacer/Aero-Holding Dorise Bullion Use with inhaler.   spironolactone (ALDACTONE) 25 MG tablet TAKE 1 TABLET(25 MG) BY MOUTH DAILY   tirzepatide (MOUNJARO) 12.5 MG/0.5ML Pen Inject 12.5 mg into the skin once a week.   torsemide (DEMADEX) 20 MG tablet Take 2 tablets (40 mg total) by mouth 2 (two) times daily.   triamcinolone ointment (KENALOG) 0.5 % Apply 1 Application topically as needed (knee pain).   VENTOLIN HFA 108 (90 Base) MCG/ACT inhaler Inhale 2 puffs into the lungs every 4 (four) hours as needed for wheezing.   [DISCONTINUED] atorvastatin (LIPITOR) 40 MG tablet Take 1 tablet (40 mg total) by mouth at bedtime.   [DISCONTINUED] montelukast (SINGULAIR) 10 MG tablet TAKE 1 TABLET(10 MG) BY MOUTH AT BEDTIME   No facility-administered encounter medications on file as of 09/15/2021.    Allergies (verified) Penicillins, Shellfish allergy, Sulfonamide derivatives, Hydrochlorothiazide w-triamterene, Lotensin [benazepril hcl], Dipyridamole, Estrogens,  Hydrochlorothiazide, Latex, Metronidazole, Other, Spironolactone, Torsemide, Valsartan, and Mustard [allyl isothiocyanate]   History: Past Medical History:  Diagnosis Date   Anemia    Asthma    CAD (coronary artery disease)    Carpal tunnel syndrome    Cellulitis of both lower extremities 04/11/2015   CHF (congestive heart failure) (HCC)    Chronic kidney disease    [facility]- followed by [facility]   Colon polyp, hyperplastic 1610 & 9604   Complication of anesthesia 1999   svt with renal calculi surgery, no problems since   CTS (carpal tunnel syndrome)    bilateral   Diabetes mellitus    Eczema    GERD (gastroesophageal reflux disease)    History of kidney stones 1999   Hyperlipidemia    Hypertension    Leg ulcer ([place]) 04/24/2015   Right lateral leg No evidence of an infection Monitor closely Keep edema controlled    Leg ulcer ([place])  right lower   Meralgia paresthetica    Dr. [name]   Morbid obesity [facility])    Neuropathy    toes and legs   Osteoarthrosis, unspecified whether generalized or localized, lower leg    knee   PUD (peptic ulcer disease)    Shortness of breath dyspnea    with exertion   Sleep apnea    per progress note 02/25/2018   Type II or unspecified type diabetes mellitus without mention of complication, not stated as uncontrolled    Unspecified hereditary and idiopathic peripheral neuropathy    Urticaria    Vitamin B12 deficiency    Wears glasses    Wound cellulitis    right upper leg, healing well   Past Surgical History:  Procedure Laterality Date   ABDOMINAL HYSTERECTOMY     BIOPSY  03/20/2021   Procedure: BIOPSY;  Surgeon: Jerene Bears, MD;  Location: [facility];  Service: Gastroenterology;;   BREAST BIOPSY Right 07/08/2018   CARDIAC CATHETERIZATION  2002   non obstructive disease   colonoscopy with polypectomy  2007 & 2012    hyperplastic ;Dr Watt Climes   COLONOSCOPY WITH PROPOFOL N/A 06/04/2015   Procedure: COLONOSCOPY WITH PROPOFOL;  Surgeon:  Jerene Bears, MD;  Location: [facility];  Service: Gastroenterology;  Laterality: N/A;   COLONOSCOPY WITH PROPOFOL N/A 03/20/2021   Procedure: COLONOSCOPY WITH PROPOFOL;  Surgeon: Jerene Bears, MD;  Location: [facility];  Service: Gastroenterology;  Laterality: N/A;   DEBRIDEMENT LEG Right 03/02/2018   WOUND VAC APPLIED   DEBRIDEMENT LEG Right 05/27/2018   RIGHT LOWER LEG DEBRIDEMENT,  SKIN GRAFT, VAC PLACEMENT    DILATION AND CURETTAGE OF UTERUS     multiple   ESOPHAGOGASTRODUODENOSCOPY (EGD) WITH PROPOFOL N/A 03/20/2021   Procedure: ESOPHAGOGASTRODUODENOSCOPY (EGD) WITH PROPOFOL;  Surgeon: Jerene Bears, MD;  Location: [facility];  Service: Gastroenterology;  Laterality: N/A;   HEMORRHOID SURGERY     HEMORROIDECTOMY     I & D EXTREMITY Right 03/02/2018   Procedure: RIGHT LEG DEBRIDEMENT AND PLACE VAC;  Surgeon: [name], MD;  Location: [place];  Service: Orthopedics;  Laterality: Right;   I & D EXTREMITY Right 03/04/2018   Procedure: REPEAT IRRIGATION AND DEBRIDEMENT RIGHT LEG, PLACE WOUND VAC;  Surgeon: [name], MD;  Location: [place];  Service: Orthopedics;  Laterality: Right;   I & D EXTREMITY Right 03/30/2018   Procedure: IRRIGATION AND DEBRIDEMENT RIGHT LEG, APPLY WOUND VAC;  Surgeon: [name], MD;  Location: [place];  Service: Orthopedics;  Laterality: Right;   I & D EXTREMITY Right 05/27/2018   Procedure: RIGHT LOWER LEG DEBRIDEMENT,  SKIN GRAFT, VAC PLACEMENT;  Surgeon: [name], MD;  Location: [place];  Service: Orthopedics;  Laterality: Right;  RIGHT LOWER LEG DEBRIDEMENT,  SKIN GRAFT, VAC PLACEMENT   POLYPECTOMY  03/20/2021   Procedure: POLYPECTOMY;  Surgeon: Jerene Bears, MD;  Location: [facility];  Service: Gastroenterology;;   renal calculi  12/1997   SVT with induction of anesthesia   RIGHT HEART CATH N/A 11/19/2020   Procedure: RIGHT HEART CATH;  Surgeon: Jolaine Artist, MD;  Location: [place] [facility];  Service: Cardiovascular;  Laterality:  N/A;   right knee arthroscopy     SKIN SPLIT GRAFT Right 03/04/2018   Procedure: POSSIBLE SPLIT THICKNESS SKIN GRAFT;  Surgeon: [name], MD;  Location: [place];  Service: Orthopedics;  Laterality: Right;   SKIN SPLIT GRAFT Right 04/01/2018   Procedure: REPEAT  IRRIGATION AND DEBRIDEMENT RIGHT LEG, APPLY SPLIT THICKNESS SKIN GRAFT;  Surgeon: [name], MD;  Location: [place];  Service: Orthopedics;  Laterality: Right;   TONSILLECTOMY AND ADENOIDECTOMY     Family History  Problem Relation Age of Onset   Colon cancer Mother    Prostate cancer Father    Colon cancer Father    Diabetes Maternal Aunt    Breast cancer Maternal Aunt    Diabetes Maternal Uncle    Diabetes Paternal Aunt    Stroke Paternal Aunt        > 75   Heart disease Paternal Aunt    Diabetes Paternal Uncle    Breast cancer Maternal Aunt         X 2   Breast cancer Cousin    Social History   Socioeconomic History   Marital status: Married    Spouse name: Merchandiser, retail   Number of children: 1   Years of education: BS   Highest education level: Not on file  Occupational History   Occupation: Disabled    Employer: RETIRED  Tobacco Use   Smoking status: Never   Smokeless tobacco: Never  Vaping Use   Vaping Use: Never used  Substance and Sexual Activity   Alcohol use: No    Alcohol/week: 0.0 standard drinks of alcohol   Drug use: No   Sexual activity: Not Currently  Other Topics Concern   Not on file  Social History Narrative   Patient lives at home with her husband [name] Patient is retired and has a Conservation officer, nature.    Caffeine - some times.   Right handed.   Social Determinants of Health   Financial Resource Strain: Low Risk  (09/15/2021)   Overall Financial Resource Strain (CARDIA)    Difficulty of Paying Living Expenses: Not hard at all  Food Insecurity: No Food Insecurity (09/15/2021)   Hunger Vital Sign    Worried About Running Out of Food in the Last Year: Never true    Ran Out of  Food in the Last Year: Never true  Transportation Needs: No Transportation Needs (09/15/2021)   PRAPARE - Hydrologist (Medical): No    Lack of Transportation (Non-Medical): No  Physical Activity: Inactive (09/15/2021)   Exercise Vital Sign    Days of Exercise per Week: 0 days    Minutes of Exercise per Session: 0 min  Stress: No Stress Concern Present (09/15/2021)   [place]    Feeling of Stress : Not at all  Social Connections: Moderately Integrated (09/15/2021)   Social Connection and Isolation Panel [NHANES]    Frequency of Communication with Friends and Family: More than three times a week    Frequency of Social Gatherings with Friends and Family: Never    Attends Religious Services: More than 4 times per year    Active Member of [employer] or Organizations: No    Attends Music therapist: Never    Marital Status: Married    Tobacco Counseling Counseling given: Not Answered   Clinical Intake:  Pre-visit preparation completed: Yes  Pain : No/denies pain     Nutritional Risks: None Diabetes: Yes CBG done?: No Did pt. bring in CBG monitor from home?: No  How often do you need to have someone help you when you read instructions, pamphlets, or other written materials from your doctor or pharmacy?: 1 - Never What is the last grade level  you completed in school?: studied towards her Masters degree but did not graduate  Diabetic?yes Nutrition Risk Assessment:  Has the patient had any N/V/D within the last 2 months?  No  Does the patient have any non-healing wounds?  No  Has the patient had any unintentional weight loss or weight gain?  No   Diabetes:  Is the patient diabetic?  Yes  If diabetic, was a CBG obtained today?  No  Did the patient bring in their glucometer from home?  No  How often do you monitor your CBG's? Patient does not check her blood sugars regularly.    Financial Strains and Diabetes Management:  Are you having any financial strains with the device, your supplies or your medication? No .  Does the patient want to be seen by Chronic Care Management for management of their diabetes?  No  Would the patient like to be referred to a Nutritionist or for Diabetic Management?  No   Diabetic Exams:  Diabetic Eye Exam: Completed 01/30/2021 Diabetic Foot Exam: Completed 04/10/2021    Interpreter Needed?: No  Information entered by :: Jillene Bucks, CMA   Activities of Daily Living    09/15/2021    2:13 PM 03/19/2021    5:00 PM  In your present state of health, do you have any difficulty performing the following activities:  Hearing? 0 0  Vision? 0 0  Difficulty concentrating or making decisions? 1 0  Walking or climbing stairs? 1 1  Dressing or bathing? 0 1  Doing errands, shopping? 0 0  Preparing Food and eating ? N   Using the Toilet? N   In the past six months, have you accidently leaked urine? N   Do you have problems with loss of bowel control? N   Managing your Medications? N   Managing your Finances? N   Housekeeping or managing your Housekeeping? N     Patient Care Team: [name], MD as PCP - General (Internal Medicine)  Indicate any recent Medical Services you may have received from other than [facility] providers in the past year (date may be approximate).     Assessment:   This is a routine wellness examination for [place].  Hearing/Vision screen Patient denied any hearing difficulty. No hearing aids. Patient does wear corrective lenses.  Dietary issues and exercise activities discussed: Current Exercise Habits: The patient does not participate in regular exercise at present, Exercise limited by: None identified   Goals Addressed               This Visit's Progress     Patient Stated (pt-stated)        Patient states she would like to work on walking or standing for longer.        Depression  Screen    09/15/2021    2:07 PM 08/29/2021    1:52 PM 07/15/2018    9:47 AM 12/14/2017   10:09 AM 12/11/2016    2:47 PM 10/10/2015    9:34 AM 12/15/2012   10:39 AM  PHQ 2/9 Scores  PHQ - 2 Score 0 0 0 0 0 0 0  PHQ- 9 Score  0  0       Fall Risk    09/15/2021    2:12 PM 08/29/2021    1:53 PM 04/25/2021    1:10 PM 03/08/2020    1:07 PM 07/15/2018    9:47 AM  Fall Risk   Falls in the past year? 1 0 0 0  1  Number falls in past yr: 1 0 0 0 0  Injury with Fall? 1 0 0 0 1  Risk for fall due to : History of fall(s);Impaired mobility No Fall Risks No Fall Risks No Fall Risks   Follow up Falls evaluation completed Falls evaluation completed Falls evaluation completed Falls evaluation completed     [place]:  Any stairs in or around the home?  Yes but patient does not use them If so, are there any without handrails?  N/A Home free of loose throw rugs in walkways, pet beds, electrical cords, etc? Yes  Adequate lighting in your home to reduce risk of falls? Yes   ASSISTIVE DEVICES UTILIZED TO PREVENT FALLS:  Life alert? No  Use of a cane, walker or w/c? Yes  Grab bars in the bathroom? Yes  Shower chair or bench in shower? Yes  Elevated toilet seat or a handicapped toilet? Yes   TIMED UP AND GO:  Was the test performed? No this is a phone visit.  Length of time to ambulate 10 feet: N/A sec.   Gait is unsteady without use of assistive device.   Cognitive Function:  Patient is cogitatively intact.      09/15/2021    2:15 PM  6CIT Screen  What Year? 0 points  What month? 0 points  What time? 0 points  Count back from 20 0 points  Months in reverse 0 points  Repeat phrase 0 points  Total Score 0 points    Immunizations Immunization History  Administered Date(s) Administered   Fluad Quad(high Dose 65+) 10/28/2018, 10/18/2020   Influenza Split 12/03/2010, 11/03/2011   Influenza Whole 11/16/2006, 11/22/2007, 10/31/2008, 10/29/2009    Influenza, High Dose Seasonal PF 10/10/2015, 11/06/2016, 11/16/2017, 11/08/2019   Influenza,inj,Quad PF,6+ Mos 10/19/2012, 10/25/2013, 10/29/2014   Influenza-Unspecified 10/23/2016, 11/18/2016   Moderna Sars-Covid-2 Vaccination 03/13/2019, 04/10/2019, 12/06/2019   Pfizer Covid-19 Vaccine Bivalent Booster 108yr & up 11/08/2020   Pneumococcal Conjugate-13 06/07/2015   Pneumococcal Polysaccharide-23 03/24/2005, 01/04/2012, 06/29/2017   Td 06/24/2009   Tdap 10/09/2014   Zoster Recombinat (Shingrix) 08/11/2016, 10/23/2016   Zoster, Live 01/15/2012    TDAP status: Up to date  Flu Vaccine status: Due, Education has been provided regarding the importance of this vaccine. Advised may receive this vaccine at local pharmacy or Health Dept. Aware to provide a copy of the vaccination record if obtained from local pharmacy or Health Dept. Verbalized acceptance and understanding.  Pneumococcal vaccine status: Up to date  Covid-19 vaccine status: Completed vaccines  Qualifies for Shingles Vaccine? Yes   Zostavax completed No   Shingrix Completed?: Yes  Screening Tests Health Maintenance  Topic Date Due   INFLUENZA VACCINE  09/02/2021   COVID-19 Vaccine (5 - Moderna series) 10/01/2021 (Originally 03/11/2021)   OPHTHALMOLOGY EXAM  01/30/2022   HEMOGLOBIN A1C  03/01/2022   FOOT EXAM  04/11/2022   Diabetic kidney evaluation - Urine ACR  08/30/2022   Diabetic kidney evaluation - GFR measurement  09/13/2022   MAMMOGRAM  08/02/2023   DEXA SCAN  08/01/2024   TETANUS/TDAP  10/08/2024   COLONOSCOPY (Pts 45-445yrInsurance coverage will need to be confirmed)  03/21/2031   Pneumonia Vaccine 6562Years old  Completed   Hepatitis C Screening  Completed   HPV VACCINES  Aged Out   Zoster Vaccines- Shingrix  Discontinued    Health Maintenance  Health Maintenance Due  Topic Date Due   INFLUENZA VACCINE  09/02/2021  Colorectal cancer screening: Type of screening: Colonoscopy. Completed 03/20/21.  Repeat every 10 years  Mammogram status: Completed 08/01/21. Repeat every year  Bone Density status: Completed 08/01/21. Results reflect: Bone density results: OSTEOPOROSIS. Repeat every 3 years.  Lung Cancer Screening: (Low Dose CT Chest recommended if Age 53-80 years, 30 pack-year currently smoking OR have quit w/in 15years.) does not qualify.   Lung Cancer Screening Referral: N/A  Additional Screening:  Hepatitis C Screening: does not qualify; Completed 12/31/20  Vision Screening: Recommended annual ophthalmology exams for early detection of glaucoma and other disorders of the eye. Is the patient up to date with their annual eye exam?  Yes  Who is the provider or what is the name of the office in which the patient attends annual eye exams? [facility]  If pt is not established with a provider, would they like to be referred to a provider to establish care? No .   Dental Screening: Recommended annual dental exams for proper oral hygiene  Community Resource Referral / Chronic Care Management: CRR required this visit?  No   CCM required this visit?  No      Plan:     I have personally reviewed and noted the following in the patient's chart:   Medical and social history Use of alcohol, tobacco or illicit drugs  Current medications and supplements including opioid prescriptions.  Functional ability and status Nutritional status Physical activity Advanced directives List of other physicians Hospitalizations, surgeries, and ER visits in previous 12 months Vitals Screenings to include cognitive, depression, and falls Referrals and appointments  In addition, I have reviewed and discussed with patient certain preventive protocols, quality metrics, and best practice recommendations. A written personalized care plan for preventive services as well as general preventive health recommendations were provided to patient.     Rossie Muskrat, CMA   09/15/2021

## 2021-09-15 NOTE — Telephone Encounter (Signed)
Refills sent in today for patient.

## 2021-09-16 ENCOUNTER — Telehealth (HOSPITAL_COMMUNITY): Payer: Self-pay | Admitting: *Deleted

## 2021-09-16 ENCOUNTER — Telehealth: Payer: Self-pay | Admitting: Internal Medicine

## 2021-09-16 NOTE — Telephone Encounter (Signed)
Pt left vm stating she wants to change her lab visit on Friday  to a full visit because she has blood in stool and had pain in the left side of her stomach. There are no appointments available on Friday. More than likely this will need to be managed by pcp but will forward to provider for advice.   Routed to [employer]

## 2021-09-16 NOTE — Telephone Encounter (Signed)
Pt states she has seen some BR[facility]PR while having a BM. States she had colon in February and everything was good. Colon report mentioned small internal hemorrhoids. Pt requesting to be seen. Pt scheduled to see Ellouise Newer PA 10/03/21 at 1:30pm. Pt aware of appt.

## 2021-09-16 NOTE — Telephone Encounter (Signed)
Patient called, requesting  a call back as soon as possible. States he is having rectal bleeding and abdominal pain.

## 2021-09-17 NOTE — Telephone Encounter (Signed)
Left vm for return call

## 2021-09-19 ENCOUNTER — Ambulatory Visit (HOSPITAL_COMMUNITY)
Admission: RE | Admit: 2021-09-19 | Discharge: 2021-09-19 | Disposition: A | Discharge status: Home / Self Care | Payer: Medicare PPO | Source: Ambulatory Visit | Attending: Cardiology | Admitting: Cardiology

## 2021-09-19 DIAGNOSIS — I5032 Chronic diastolic (congestive) heart failure: Secondary | ICD-10-CM | POA: Insufficient documentation

## 2021-09-19 LAB — BASIC METABOLIC PANEL
Anion gap: 13 (ref 5–15)
BUN: 31 mg/dL — ABNORMAL HIGH (ref 8–23)
CO2: 32 mmol/L (ref 22–32)
Calcium: 9 mg/dL (ref 8.9–10.3)
Chloride: 92 mmol/L — ABNORMAL LOW (ref 98–111)
Creatinine, Ser: 2.17 mg/dL — ABNORMAL HIGH (ref 0.44–1.00)
GFR, Estimated: 24 mL/min — ABNORMAL LOW (ref >60)
Glucose, Bld: 103 mg/dL — ABNORMAL HIGH (ref 70–99)
Potassium: 3.1 mmol/L — ABNORMAL LOW (ref 3.5–5.1)
Sodium: 137 mmol/L (ref 135–145)

## 2021-09-22 ENCOUNTER — Telehealth (HOSPITAL_COMMUNITY): Payer: Self-pay

## 2021-09-22 DIAGNOSIS — I5032 Chronic diastolic (congestive) heart failure: Secondary | ICD-10-CM

## 2021-09-22 MED ORDER — POTASSIUM CHLORIDE CRYS ER 20 MEQ PO TBCR: 20.0000 meq | EXTENDED_RELEASE_TABLET | Freq: Two times a day (BID) | ORAL | 3 refills | Status: DC

## 2021-09-22 NOTE — Telephone Encounter (Addendum)
Pt aware, agreeable, and verbalized understanding  Labs scheduled  ----- Message from Conrad [place], NP sent at 09/22/2021  7:51 AM EDT ----- Please call. K low and renal function trending up. Stop metolazone.  Increase potassium to 20 meq twice a day. Repeat BMET the end of the week.

## 2021-09-22 NOTE — Addendum Note (Signed)
Addended by: Jerl Mina on: 09/22/2021 11:01 AM   Modules accepted: Orders

## 2021-09-29 ENCOUNTER — Telehealth: Payer: Self-pay | Admitting: Internal Medicine

## 2021-09-29 ENCOUNTER — Telehealth: Payer: Self-pay | Admitting: Podiatry

## 2021-09-29 ENCOUNTER — Other Ambulatory Visit: Payer: Self-pay

## 2021-09-29 MED ORDER — TIRZEPATIDE 12.5 MG/0.5ML ~~LOC~~ SOAJ: 12.5000 mg | SUBCUTANEOUS | 3 refills | Status: DC

## 2021-09-29 NOTE — Telephone Encounter (Signed)
Patient needs her monjauro called in to [facility].

## 2021-09-29 NOTE — Telephone Encounter (Signed)
Patient called to check to see if her diabetic shoes came in , she was told it would be 2 weeks for the correct pair of shoes to come in? Please advise.

## 2021-09-29 NOTE — Telephone Encounter (Signed)
Sent in today 

## 2021-09-30 ENCOUNTER — Ambulatory Visit (HOSPITAL_COMMUNITY)
Admission: RE | Admit: 2021-09-30 | Discharge: 2021-09-30 | Disposition: A | Discharge status: Home / Self Care | Payer: Medicare PPO | Source: Ambulatory Visit | Attending: Cardiology | Admitting: Cardiology

## 2021-09-30 ENCOUNTER — Other Ambulatory Visit (HOSPITAL_COMMUNITY): Payer: Self-pay

## 2021-09-30 ENCOUNTER — Other Ambulatory Visit (HOSPITAL_COMMUNITY): Payer: Medicare PPO

## 2021-09-30 ENCOUNTER — Other Ambulatory Visit: Payer: Self-pay

## 2021-09-30 DIAGNOSIS — I5032 Chronic diastolic (congestive) heart failure: Secondary | ICD-10-CM | POA: Diagnosis not present

## 2021-09-30 LAB — BASIC METABOLIC PANEL
Anion gap: 10 (ref 5–15)
BUN: 35 mg/dL — ABNORMAL HIGH (ref 8–23)
CO2: 34 mmol/L — ABNORMAL HIGH (ref 22–32)
Calcium: 9.1 mg/dL (ref 8.9–10.3)
Chloride: 94 mmol/L — ABNORMAL LOW (ref 98–111)
Creatinine, Ser: 2.25 mg/dL — ABNORMAL HIGH (ref 0.44–1.00)
GFR, Estimated: 23 mL/min — ABNORMAL LOW (ref >60)
Glucose, Bld: 121 mg/dL — ABNORMAL HIGH (ref 70–99)
Potassium: 3.6 mmol/L (ref 3.5–5.1)
Sodium: 138 mmol/L (ref 135–145)

## 2021-09-30 MED ORDER — TIRZEPATIDE 12.5 MG/0.5ML ~~LOC~~ SOAJ
12.5000 mg | SUBCUTANEOUS | 3 refills | Status: DC
Start: 2021-09-30 — End: 2021-11-25
  Filled 2021-09-30: qty 2, 28d supply, fill #0
  Filled 2021-10-24 – 2021-10-27 (×4): qty 2, 28d supply, fill #1

## 2021-09-30 NOTE — Telephone Encounter (Signed)
Pt is requesting that the Rx tirzepatide [facility]) 12.5 MG/0.5ML Pen be sent to: Pharmacy:  [place] As [facility] haven't been having the medication.  Please advise

## 2021-09-30 NOTE — Telephone Encounter (Signed)
Sent in for patient today. 

## 2021-10-01 ENCOUNTER — Other Ambulatory Visit (HOSPITAL_COMMUNITY): Payer: Self-pay

## 2021-10-03 ENCOUNTER — Encounter: Payer: Self-pay | Admitting: Physician Assistant

## 2021-10-03 ENCOUNTER — Encounter (HOSPITAL_COMMUNITY): Payer: Self-pay

## 2021-10-03 ENCOUNTER — Telehealth (HOSPITAL_COMMUNITY): Payer: Self-pay | Admitting: Surgery

## 2021-10-03 ENCOUNTER — Ambulatory Visit (HOSPITAL_COMMUNITY)
Admission: RE | Admit: 2021-10-03 | Discharge: 2021-10-03 | Disposition: A | Discharge status: Home / Self Care | Payer: Medicare PPO | Source: Ambulatory Visit | Attending: Family Medicine | Admitting: Family Medicine

## 2021-10-03 ENCOUNTER — Telehealth: Payer: Self-pay

## 2021-10-03 ENCOUNTER — Ambulatory Visit (INDEPENDENT_AMBULATORY_CARE_PROVIDER_SITE_OTHER): Payer: Medicare PPO

## 2021-10-03 ENCOUNTER — Encounter (HOSPITAL_COMMUNITY): Payer: Medicare PPO

## 2021-10-03 ENCOUNTER — Ambulatory Visit: Payer: Medicare PPO | Admitting: Physician Assistant

## 2021-10-03 VITALS — BP 122/70 | HR 92

## 2021-10-03 VITALS — BP 108/70 | HR 92

## 2021-10-03 DIAGNOSIS — Z7984 Long term (current) use of oral hypoglycemic drugs: Secondary | ICD-10-CM | POA: Diagnosis not present

## 2021-10-03 DIAGNOSIS — Z86718 Personal history of other venous thrombosis and embolism: Secondary | ICD-10-CM | POA: Insufficient documentation

## 2021-10-03 DIAGNOSIS — J9611 Chronic respiratory failure with hypoxia: Secondary | ICD-10-CM | POA: Diagnosis not present

## 2021-10-03 DIAGNOSIS — I825Z3 Chronic embolism and thrombosis of unspecified deep veins of distal lower extremity, bilateral: Secondary | ICD-10-CM | POA: Diagnosis not present

## 2021-10-03 DIAGNOSIS — E785 Hyperlipidemia, unspecified: Secondary | ICD-10-CM | POA: Insufficient documentation

## 2021-10-03 DIAGNOSIS — K648 Other hemorrhoids: Secondary | ICD-10-CM

## 2021-10-03 DIAGNOSIS — Z79899 Other long term (current) drug therapy: Secondary | ICD-10-CM | POA: Insufficient documentation

## 2021-10-03 DIAGNOSIS — E538 Deficiency of other specified B group vitamins: Secondary | ICD-10-CM | POA: Diagnosis not present

## 2021-10-03 DIAGNOSIS — I2510 Atherosclerotic heart disease of native coronary artery without angina pectoris: Secondary | ICD-10-CM | POA: Diagnosis not present

## 2021-10-03 DIAGNOSIS — N1832 Chronic kidney disease, stage 3b: Secondary | ICD-10-CM | POA: Insufficient documentation

## 2021-10-03 DIAGNOSIS — Z7901 Long term (current) use of anticoagulants: Secondary | ICD-10-CM | POA: Diagnosis not present

## 2021-10-03 DIAGNOSIS — I272 Pulmonary hypertension, unspecified: Secondary | ICD-10-CM | POA: Insufficient documentation

## 2021-10-03 DIAGNOSIS — I130 Hypertensive heart and chronic kidney disease with heart failure and stage 1 through stage 4 chronic kidney disease, or unspecified chronic kidney disease: Secondary | ICD-10-CM | POA: Insufficient documentation

## 2021-10-03 DIAGNOSIS — K5900 Constipation, unspecified: Secondary | ICD-10-CM | POA: Diagnosis not present

## 2021-10-03 DIAGNOSIS — E1122 Type 2 diabetes mellitus with diabetic chronic kidney disease: Secondary | ICD-10-CM | POA: Diagnosis not present

## 2021-10-03 DIAGNOSIS — I5032 Chronic diastolic (congestive) heart failure: Secondary | ICD-10-CM | POA: Insufficient documentation

## 2021-10-03 DIAGNOSIS — R1084 Generalized abdominal pain: Secondary | ICD-10-CM

## 2021-10-03 DIAGNOSIS — J45909 Unspecified asthma, uncomplicated: Secondary | ICD-10-CM | POA: Insufficient documentation

## 2021-10-03 LAB — BASIC METABOLIC PANEL
Anion gap: 11 (ref 5–15)
BUN: 31 mg/dL — ABNORMAL HIGH (ref 8–23)
CO2: 34 mmol/L — ABNORMAL HIGH (ref 22–32)
Calcium: 9.6 mg/dL (ref 8.9–10.3)
Chloride: 95 mmol/L — ABNORMAL LOW (ref 98–111)
Creatinine, Ser: 2 mg/dL — ABNORMAL HIGH (ref 0.44–1.00)
GFR, Estimated: 26 mL/min — ABNORMAL LOW (ref >60)
Glucose, Bld: 110 mg/dL — ABNORMAL HIGH (ref 70–99)
Potassium: 4 mmol/L (ref 3.5–5.1)
Sodium: 140 mmol/L (ref 135–145)

## 2021-10-03 MED ORDER — CYANOCOBALAMIN 1000 MCG/ML IJ SOLN
1000.0000 ug | Freq: Once | INTRAMUSCULAR | Status: AC
  Administered 2021-10-03: 1000 ug via INTRAMUSCULAR

## 2021-10-03 MED ORDER — TORSEMIDE 20 MG PO TABS
ORAL_TABLET | ORAL | 3 refills | Status: DC
Start: 2021-10-03 — End: 2022-11-17

## 2021-10-03 NOTE — Progress Notes (Signed)
Chief Complaint: BRBPR  HPI:    April [name] is a 71 year old African-American female, known to Dr. Hilarie Fredrickson, with a past medical history of CHF, CKD, CAD on Eliquis and multiple others, who was referred to me by [name], MD for a complaint of bright red blood per rectum.      03/20/2021 EGD and colonoscopy.  Colonoscopy with one 2 mm polyp in the ascending colon, small internal hemorrhoids and otherwise normal.  EGD with a widely patent Schatzki's ring, 3 cm hiatal hernia and mild gastritis without erosion or ulceration.    08/29/2021 CBC with a normal hemoglobin at 12.6.    09/16/2021 patient called and describes some bright red blood per rectum with a bowel movement.    09/30/2021 BMP with a BUN of 35 (15 on 09/03/2021) and a creatinine of 2.25 (1.67 on 09/03/2021).    Today, patient presents to clinic accompanied by her husband and explains that over the past couple of months she has been noticing some bright red blood with her bowel movements.  Tells me very occasionally will leave a "spot on my underwear", and other times she sees it after wiping from a bowel movement.  It is never mixed in with her stool and never a lot.  Tells me prior to her last colonoscopy she never remembers seeing any blood and she is pretty good looking at her bowel movements.  Does tell me she takes iron 3 days a week and typically does have some constipation with small pebble-like stools every other day or so.  She does spend most of her time in a wheelchair given knee pain and/or sitting upright in a recliner chair.  Some generalized abdominal pain, some worse on the left side.    Denies fever, chills, weight loss, palpitations or change in her chronic shortness of breath.  Past Medical History:  Diagnosis Date   Anemia    Asthma    CAD (coronary artery disease)    Carpal tunnel syndrome    Cellulitis of both lower extremities 04/11/2015   CHF (congestive heart failure) (HCC)    Chronic kidney disease    [facility]-  followed by [facility]   Colon polyp, hyperplastic 0102 & 7253   Complication of anesthesia 1999   svt with renal calculi surgery, no problems since   CTS (carpal tunnel syndrome)    bilateral   Diabetes mellitus    Eczema    GERD (gastroesophageal reflux disease)    History of kidney stones 1999   Hyperlipidemia    Hypertension    Leg ulcer ([place]) 04/24/2015   Right lateral leg No evidence of an infection Monitor closely Keep edema controlled    Leg ulcer (HCC)    right lower   Meralgia paresthetica    Dr. [name]   Morbid obesity ([place])    Neuropathy    toes and legs   Osteoarthrosis, unspecified whether generalized or localized, lower leg    knee   PUD (peptic ulcer disease)    Shortness of breath dyspnea    with exertion   Sleep apnea    per progress note 02/25/2018   Type II or unspecified type diabetes mellitus without mention of complication, not stated as uncontrolled    Unspecified hereditary and idiopathic peripheral neuropathy    Urticaria    Vitamin B12 deficiency    Wears glasses    Wound cellulitis    right upper leg, healing well    Past Surgical History:  Procedure Laterality Date   ABDOMINAL HYSTERECTOMY     BIOPSY  03/20/2021   Procedure: BIOPSY;  Surgeon: Jerene Bears, MD;  Location: [facility];  Service: Gastroenterology;;   BREAST BIOPSY Right 07/08/2018   CARDIAC CATHETERIZATION  2002   non obstructive disease   colonoscopy with polypectomy  2007 & 2012    hyperplastic ;Dr Watt Climes   COLONOSCOPY WITH PROPOFOL N/A 06/04/2015   Procedure: COLONOSCOPY WITH PROPOFOL;  Surgeon: Jerene Bears, MD;  Location: [facility];  Service: Gastroenterology;  Laterality: N/A;   COLONOSCOPY WITH PROPOFOL N/A 03/20/2021   Procedure: COLONOSCOPY WITH PROPOFOL;  Surgeon: Jerene Bears, MD;  Location: [facility];  Service: Gastroenterology;  Laterality: N/A;   DEBRIDEMENT LEG Right 03/02/2018   WOUND VAC APPLIED   DEBRIDEMENT LEG Right 05/27/2018   RIGHT LOWER LEG  DEBRIDEMENT,  SKIN GRAFT, VAC PLACEMENT    DILATION AND CURETTAGE OF UTERUS     multiple   ESOPHAGOGASTRODUODENOSCOPY (EGD) WITH PROPOFOL N/A 03/20/2021   Procedure: ESOPHAGOGASTRODUODENOSCOPY (EGD) WITH PROPOFOL;  Surgeon: Jerene Bears, MD;  Location: [facility];  Service: Gastroenterology;  Laterality: N/A;   HEMORRHOID SURGERY     HEMORROIDECTOMY     I & D EXTREMITY Right 03/02/2018   Procedure: RIGHT LEG DEBRIDEMENT AND PLACE VAC;  Surgeon: [name], MD;  Location: [place];  Service: Orthopedics;  Laterality: Right;   I & D EXTREMITY Right 03/04/2018   Procedure: REPEAT IRRIGATION AND DEBRIDEMENT RIGHT LEG, PLACE WOUND VAC;  Surgeon: [name], MD;  Location: [place];  Service: Orthopedics;  Laterality: Right;   I & D EXTREMITY Right 03/30/2018   Procedure: IRRIGATION AND DEBRIDEMENT RIGHT LEG, APPLY WOUND VAC;  Surgeon: [name], MD;  Location: [place];  Service: Orthopedics;  Laterality: Right;   I & D EXTREMITY Right 05/27/2018   Procedure: RIGHT LOWER LEG DEBRIDEMENT,  SKIN GRAFT, VAC PLACEMENT;  Surgeon: [name], MD;  Location: [place];  Service: Orthopedics;  Laterality: Right;  RIGHT LOWER LEG DEBRIDEMENT,  SKIN GRAFT, VAC PLACEMENT   POLYPECTOMY  03/20/2021   Procedure: POLYPECTOMY;  Surgeon: Jerene Bears, MD;  Location: [facility];  Service: Gastroenterology;;   renal calculi  12/1997   SVT with induction of anesthesia   RIGHT HEART CATH N/A 11/19/2020   Procedure: RIGHT HEART CATH;  Surgeon: Jolaine Artist, MD;  Location: [facility];  Service: Cardiovascular;  Laterality: N/A;   right knee arthroscopy     SKIN SPLIT GRAFT Right 03/04/2018   Procedure: POSSIBLE SPLIT THICKNESS SKIN GRAFT;  Surgeon: [name], MD;  Location: [place];  Service: Orthopedics;  Laterality: Right;   SKIN SPLIT GRAFT Right 04/01/2018   Procedure: REPEAT IRRIGATION AND DEBRIDEMENT RIGHT LEG, APPLY SPLIT THICKNESS SKIN GRAFT;  Surgeon: [name], MD;  Location: [place];  Service: Orthopedics;  Laterality: Right;   TONSILLECTOMY AND ADENOIDECTOMY      Current Outpatient Medications  Medication Sig Dispense Refill   acetaminophen (TYLENOL) 650 MG CR tablet Take 1,300 mg by mouth every 8 (eight) hours as needed for pain.     Alcohol Swabs (DROPSAFE ALCOHOL PREP) 70 % PADS USE TOPICALLY AS DIRECTED 300 each 3   allopurinol (ZYLOPRIM) 100 MG tablet TAKE 2 TABLETS(200 MG) BY MOUTH DAILY 180 tablet 1   apixaban (ELIQUIS) 2.5 MG TABS tablet Take 1 tablet (2.5 mg total) by mouth 2 (two) times daily. 180 tablet 1   atorvastatin (LIPITOR) 40  MG tablet Take 1 tablet (40 mg total) by mouth at bedtime. 90 tablet 2   Blood Glucose Calibration (TRUE METRIX LEVEL 1) Low SOLN UAD  E11.9 1 each 3   Blood Glucose Monitoring Suppl (TRUE METRIX METER) DEVI True Metrix Level 1 solution     Budeson-Glycopyrrol-Formoterol (BREZTRI AEROSPHERE) 160-9-4.8 MCG/ACT AERO Inhale 2 puffs into the lungs in the morning and at bedtime. 10.7 g 0   calcium carbonate (TUMS - DOSED IN MG ELEMENTAL CALCIUM) 500 MG chewable tablet Chew 2 tablets by mouth daily as needed for indigestion or heartburn.     cyanocobalamin (,VITAMIN B-12,) 1000 MCG/ML injection INJECT 1ML INTO MUSCLE EVERY 30 DAYS 10 mL 5   dapagliflozin propanediol (FARXIGA) 10 MG TABS tablet Take 10 mg by mouth daily.     diclofenac Sodium (VOLTAREN) 1 % GEL Apply 1 application topically as needed (pain).     diphenhydrAMINE (BENADRYL) 12.5 MG/5ML liquid Take 25 mg by mouth 4 (four) times daily as needed for allergies.     eszopiclone (LUNESTA) 1 MG TABS tablet Take 1 mg by mouth at bedtime as needed for sleep. Take immediately before bedtime     famotidine (PEPCID) 20 MG tablet Take 1 tablet (20 mg total) by mouth 2 (two) times daily. 180 tablet 3   FEROSUL 325 (65 Fe) MG tablet TAKE 1 TABLET BY MOUTH DAILY ON(MONDAYS WEDNESDAY AND FRIDAYS) 60 tablet 0   fluticasone (FLONASE) 50 MCG/ACT nasal spray Place 2 sprays into both nostrils  daily. 18.2 mL 2   gabapentin (NEURONTIN) 100 MG capsule TAKE 1 CAPSULE(100 MG) BY MOUTH THREE TIMES DAILY 90 capsule 1   guaiFENesin-dextromethorphan (ROBITUSSIN DM) 100-10 MG/5ML syrup Take 5 mLs by mouth every 4 (four) hours as needed for cough.     hydrOXYzine (ATARAX) 25 MG tablet Take 1 tablet (25 mg total) by mouth 3 (three) times daily as needed for itching. 90 tablet 1   loratadine (CLARITIN) 10 MG tablet Take 1 tablet (10 mg total) by mouth daily. 30 tablet 5   montelukast (SINGULAIR) 10 MG tablet TAKE 1 TABLET(10 MG) BY MOUTH AT BEDTIME 30 tablet 5   nebivolol (BYSTOLIC) 5 MG tablet TAKE 1 TABLET(5 MG) BY MOUTH DAILY 30 tablet 3   ondansetron (ZOFRAN) 4 MG tablet Take 1 tablet (4 mg total) by mouth every 8 (eight) hours as needed for nausea or vomiting. 20 tablet 0   Oxcarbazepine (TRILEPTAL) 300 MG tablet Take 1 tablet (300 mg total) by mouth 2 (two) times daily. 180 tablet 1   oxyCODONE (OXY IR/ROXICODONE) 5 MG immediate release tablet Take 1 tablet (5 mg total) by mouth every 6 (six) hours as needed for severe pain (Chronic knee osteoarthritis). 30 tablet 0   polyethylene glycol (MIRALAX / GLYCOLAX) 17 g packet Take 17 g by mouth daily as needed for moderate constipation.     potassium chloride SA (KLOR-CON M) 20 MEQ tablet Take 1 tablet (20 mEq total) by mouth 2 (two) times daily. 128 tablet 3   Spacer/Aero-Holding [facility] Use with inhaler. 1 each 2   spironolactone (ALDACTONE) 25 MG tablet TAKE 1 TABLET(25 MG) BY MOUTH DAILY 90 tablet 0   tirzepatide (MOUNJARO) 12.5 MG/0.5ML Pen Inject 12.5 mg into the skin once a week. 2 mL 3   torsemide (DEMADEX) 20 MG tablet Take 2 tablets (40 mg total) by mouth 2 (two) times daily. 360 tablet 2   triamcinolone ointment (KENALOG) 0.5 % Apply 1 Application topically as needed (knee pain).  VENTOLIN HFA 108 (90 Base) MCG/ACT inhaler Inhale 2 puffs into the lungs every 4 (four) hours as needed for wheezing.     No current  facility-administered medications for this visit.    Allergies as of 10/03/2021 - Review Complete 10/03/2021  Allergen Reaction Noted   Penicillins Rash and Other (See Comments) 01/08/2010   Shellfish allergy Anaphylaxis 03/31/2018   Sulfonamide derivatives Anaphylaxis and Other (See Comments) 01/08/2010   Hydrochlorothiazide w-triamterene Other (See Comments) 01/08/2010   Lotensin [benazepril hcl] Hives 05/25/2017   Dipyridamole Other (See Comments) 05/25/2017   Estrogens Other (See Comments) 05/25/2017   Hydrochlorothiazide Other (See Comments) 05/25/2017   Latex Other (See Comments) 05/08/2020   Metronidazole Other (See Comments)    Other Itching 08/12/2018   Spironolactone Other (See Comments)    Torsemide Other (See Comments)    Valsartan Other (See Comments)    Mustard [allyl isothiocyanate] Itching 03/31/2018    Family History  Problem Relation Age of Onset   Colon cancer Mother    Prostate cancer Father    Colon cancer Father    Diabetes Maternal Aunt    Breast cancer Maternal Aunt    Diabetes Maternal Uncle    Diabetes Paternal Aunt    Stroke Paternal Aunt        > 13   Heart disease Paternal Aunt    Diabetes Paternal Uncle    Breast cancer Maternal Aunt         X 2   Breast cancer Cousin     Social History   Socioeconomic History   Marital status: Married    Spouse name: Merchandiser, retail   Number of children: 1   Years of education: BS   Highest education level: Not on file  Occupational History   Occupation: Disabled    Employer: RETIRED  Tobacco Use   Smoking status: Never   Smokeless tobacco: Never  Vaping Use   Vaping Use: Never used  Substance and Sexual Activity   Alcohol use: No    Alcohol/week: 0.0 standard drinks of alcohol   Drug use: No   Sexual activity: Not Currently  Other Topics Concern   Not on file  Social History Narrative   Patient lives at home with her husband [name] Patient is retired and has a Conservation officer, nature.     Caffeine - some times.   Right handed.   Social Determinants of Health   Financial Resource Strain: Low Risk  (09/15/2021)   Overall Financial Resource Strain (CARDIA)    Difficulty of Paying Living Expenses: Not hard at all  Food Insecurity: No Food Insecurity (09/15/2021)   Hunger Vital Sign    Worried About Running Out of Food in the Last Year: Never true    Ran Out of Food in the Last Year: Never true  Transportation Needs: No Transportation Needs (09/15/2021)   PRAPARE - Hydrologist (Medical): No    Lack of Transportation (Non-Medical): No  Physical Activity: Inactive (09/15/2021)   Exercise Vital Sign    Days of Exercise per Week: 0 days    Minutes of Exercise per Session: 0 min  Stress: No Stress Concern Present (09/15/2021)   [place]    Feeling of Stress : Not at all  Social Connections: Moderately Integrated (09/15/2021)   Social Connection and Isolation Panel [NHANES]    Frequency of Communication with Friends and Family: More than three times a week  Frequency of Social Gatherings with Friends and Family: Never    Attends Religious Services: More than 4 times per year    Active Member of Clubs or Organizations: No    Attends Archivist Meetings: Never    Marital Status: Married  Human resources officer Violence: Not At Risk (09/15/2021)   Humiliation, Afraid, Rape, and Kick questionnaire    Fear of Current or Ex-Partner: No    Emotionally Abused: No    Physically Abused: No    Sexually Abused: No    Review of Systems:    Constitutional: No weight loss, fever or chills Cardiovascular: No chest pain Respiratory: +chronic DOE Gastrointestinal: See HPI and otherwise negative   Physical Exam:  Vital signs: BP 122/70 (BP Location: Left Wrist, Patient Position: Sitting, Cuff Size: Normal)   Pulse 92   In a wheelchair today, last documented weight 342 # on  09/03/21  Constitutional:   Pleasant morbidly obese AA female appears to be in NAD, Well developed, Well nourished, alert and cooperative Respiratory: Respirations even and unlabored. Lungs clear to auscultation bilaterally.   No wheezes, crackles, or rhonchi.  Cardiovascular: Normal S1, S2. No MRG. Regular rate and rhythm. No peripheral edema, cyanosis or pallor.  Gastrointestinal:  Soft, nondistended, nontender. No rebound or guarding. Normal bowel sounds. No appreciable masses or hepatomegaly. Rectal: External: No fissure or hemorrhoids; internal: Grade 2 hemorrhoids with obvious stigmata of bleeding Psychiatric: Oriented to person, place and time. Demonstrates good judgement and reason without abnormal affect or behaviors.  RELEVANT LABS AND IMAGING: CBC    Component Value Date/Time   WBC 5.4 08/29/2021 1450   RBC 4.21 08/29/2021 1450   HGB 12.6 08/29/2021 1450   HGB 11.3 (L) 06/13/2021 1339   HCT 40.6 08/29/2021 1450   PLT 150.0 08/29/2021 1450   PLT 206 06/13/2021 1339   MCV 96.4 08/29/2021 1450   MCH 32.3 06/13/2021 1339   MCHC 31.1 08/29/2021 1450   RDW 15.9 (H) 08/29/2021 1450   LYMPHSABS 1.1 08/29/2021 1450   MONOABS 0.4 08/29/2021 1450   EOSABS 0.1 08/29/2021 1450   BASOSABS 0.1 08/29/2021 1450    CMP     Component Value Date/Time   NA 138 09/30/2021 0853   NA 145 (H) 10/25/2012 1117   K 3.6 09/30/2021 0853   CL 94 (L) 09/30/2021 0853   CO2 34 (H) 09/30/2021 0853   GLUCOSE 121 (H) 09/30/2021 0853   BUN 35 (H) 09/30/2021 0853   BUN 24 10/25/2012 1117   CREATININE 2.25 (H) 09/30/2021 0853   CREATININE 1.50 (H) 06/13/2021 1339   CALCIUM 9.1 09/30/2021 0853   PROT 7.0 08/29/2021 1450   PROT 6.4 10/25/2012 1117   ALBUMIN 3.9 08/29/2021 1450   ALBUMIN 4.1 10/25/2012 1117   AST 15 08/29/2021 1450   AST 16 06/13/2021 1339   ALT 14 08/29/2021 1450   ALT 14 06/13/2021 1339   ALKPHOS 101 08/29/2021 1450   BILITOT 0.6 08/29/2021 1450   BILITOT 0.5 06/13/2021 1339    GFRNONAA 23 (L) 09/30/2021 0853   GFRNONAA 37 (L) 06/13/2021 1339   GFRAA 44 (L) 02/28/2019 0252    Assessment: 1.  Bleeding internal hemorrhoids: Seen at time of exam today 2.  Generalized abdominal pain: This is worse on the left side and likely related to below +/- body habitus 3.  Constipation: Likely from iron supplementation, describes small pebble-like stools occasionally  Plan: 1.  Visualized bleeding internal hemorrhoids today.  Patient tells me that she only sees  the blood occasionally maybe once a week or so and does not feel like she wants to try suppositories.  Told her to call back if it starts increasing or starts to bother her and would recommend Hydrocortisone suppositories twice daily for 1 to 2 weeks. 2.  Advised the patient to start MiraLAX on the days she is using her iron supplementation to see if this helps with her constipation and in turn her generalized abdominal pain.  If she is not seeing any change in the future she will call and let us know. 3.  Reassured the patient that she does not need another colonoscopy given that she just had work-up in February. 4.  Patient to follow in clinic with [place] as needed.  Ellouise Newer, PA-C [facility] 10/03/2021, 1:16 PM  Cc: [name], MD

## 2021-10-03 NOTE — Progress Notes (Addendum)
Advanced [facility] Failure Clinic Note   PCP: [name], MD HF Cardiologist: Dr. Haroldine Laws  Reason for visit: f/u for chronic diastolic heart failure   HPI: [name] is a 71 y.o. woman with history of morbid obesity Body mass index is 65.23 kg/m., DM2, HTN, HLD, mild-to- moderate CAD, asthma, and chronic dHF.  Cath 2/08: 50-60% LAD lesion and 30% RCA lesion.  EF 70%.  Mild pulmonary HTN primarily due to high output state.  PA mean 27 PCWP 10 PVR 1.8   Admitted 4/22 for HF Echo EF 60-65%. RV normal. She was diuresed and placed back on PO diuretics, switched from lasix 80 bid to torsemide 60 mg d/c. D/c wt 371 lb.   She saw pulmonology for OSA 10/02/20, sleep study ordered, low dose Lunesta added.  10/22 admitted with concerns of CO2 retention and HF Underwent RHC showing mild to moderate pulmonary hypertension, minimally elevated PCWP (18) and normal CO.  Sleep study 11/22 showed no OSA but + nocturnal desaturations to 84%, ONO ordered.  Follow up 2/23, fluid up and down. SOB with mild activity. Volume OK.  Acute visit 8/23, volume appeared up. CXR ordered by PCP showed mild pulmonary vascular congestion. Metolazone added weekly to her regimen. Follow up labs showed elevated SCr, metolazone stopped and torsemide reduced to 40 bid.   Today she returns for HF follow up with her husband. Overall feeling fine. Having some swelling in legs. Limited by knee OA, SOB with walking short distances. Wears 2L oxygen during the day and CPAP at night. Occasional palpitations. Denies CP or dizziness. Chronically sleeps in recliner. Appetite ok. No fever or chills. Weight at home 320-325 pounds. Taking all medications. Metolazone stopped with worsening kidney function. Having some BRBPR, has a history of hemorrhoids and seeing GI today. Eats high-salt foods.  Cardiac Studies:  - RHC (10/22):  RA = 11 RV = 68/12 PA = 69/22 (39) PCW = 18 Fick cardiac output/index = 7.0/2.8 PVR = 3.0 WU Ao sat =  96% PA sat = 61%, 62% SVC sat 69% PAPi = 4.2  - Echo (10/22): EF 65-70%, grade I DD, RV ok  - Echo 4/22: EF 60-65%. RV ok.   ROS: All systems reviewed and negative except as per HPI.   Past Medical History:  Diagnosis Date   Anemia    Asthma    CAD (coronary artery disease)    Carpal tunnel syndrome    Cellulitis of both lower extremities 04/11/2015   CHF (congestive heart failure) (HCC)    Chronic kidney disease    CKI- followed by [facility]   Colon polyp, hyperplastic 8101 & 7510   Complication of anesthesia 1999   svt with renal calculi surgery, no problems since   CTS (carpal tunnel syndrome)    bilateral   Diabetes mellitus    Eczema    GERD (gastroesophageal reflux disease)    History of kidney stones 1999   Hyperlipidemia    Hypertension    Leg ulcer ([place]) 04/24/2015   Right lateral leg No evidence of an infection Monitor closely Keep edema controlled    Leg ulcer (HCC)    right lower   Meralgia paresthetica    Dr. [name]   Morbid obesity ([place])    Neuropathy    toes and legs   Osteoarthrosis, unspecified whether generalized or localized, lower leg    knee   PUD (peptic ulcer disease)    Shortness of breath dyspnea    with exertion  Sleep apnea    per progress note 02/25/2018   Type II or unspecified type diabetes mellitus without mention of complication, not stated as uncontrolled    Unspecified hereditary and idiopathic peripheral neuropathy    Urticaria    Vitamin B12 deficiency    Wears glasses    Wound cellulitis    right upper leg, healing well   Current Outpatient Medications  Medication Sig Dispense Refill   acetaminophen (TYLENOL) 650 MG CR tablet Take 1,300 mg by mouth every 8 (eight) hours as needed for pain.     Alcohol Swabs (DROPSAFE ALCOHOL PREP) 70 % PADS USE TOPICALLY AS DIRECTED 300 each 3   allopurinol (ZYLOPRIM) 100 MG tablet TAKE 2 TABLETS(200 MG) BY MOUTH DAILY 180 tablet 1   apixaban (ELIQUIS) 2.5 MG TABS tablet Take 1  tablet (2.5 mg total) by mouth 2 (two) times daily. 180 tablet 1   atorvastatin (LIPITOR) 40 MG tablet Take 1 tablet (40 mg total) by mouth at bedtime. 90 tablet 2   Blood Glucose Calibration (TRUE METRIX LEVEL 1) Low SOLN UAD  E11.9 1 each 3   Blood Glucose Monitoring Suppl (TRUE METRIX METER) DEVI True Metrix Level 1 solution     Budeson-Glycopyrrol-Formoterol (BREZTRI AEROSPHERE) 160-9-4.8 MCG/ACT AERO Inhale 2 puffs into the lungs in the morning and at bedtime. 10.7 g 0   calcium carbonate (TUMS - DOSED IN MG ELEMENTAL CALCIUM) 500 MG chewable tablet Chew 2 tablets by mouth daily as needed for indigestion or heartburn.     cyanocobalamin (,VITAMIN B-12,) 1000 MCG/ML injection INJECT 1ML INTO MUSCLE EVERY 30 DAYS 10 mL 5   dapagliflozin propanediol (FARXIGA) 10 MG TABS tablet Take 10 mg by mouth daily.     diclofenac Sodium (VOLTAREN) 1 % GEL Apply 1 application topically as needed (pain).     diphenhydrAMINE (BENADRYL) 12.5 MG/5ML liquid Take 25 mg by mouth 4 (four) times daily as needed for allergies.     eszopiclone (LUNESTA) 1 MG TABS tablet Take 1 mg by mouth at bedtime as needed for sleep. Take immediately before bedtime     famotidine (PEPCID) 20 MG tablet Take 1 tablet (20 mg total) by mouth 2 (two) times daily. 180 tablet 3   FEROSUL 325 (65 Fe) MG tablet TAKE 1 TABLET BY MOUTH DAILY ON(MONDAYS WEDNESDAY AND FRIDAYS) 60 tablet 0   fluticasone (FLONASE) 50 MCG/ACT nasal spray Place 2 sprays into both nostrils daily. 18.2 mL 2   gabapentin (NEURONTIN) 100 MG capsule TAKE 1 CAPSULE(100 MG) BY MOUTH THREE TIMES DAILY 90 capsule 1   guaiFENesin-dextromethorphan (ROBITUSSIN DM) 100-10 MG/5ML syrup Take 5 mLs by mouth every 4 (four) hours as needed for cough.     hydrOXYzine (ATARAX) 25 MG tablet Take 1 tablet (25 mg total) by mouth 3 (three) times daily as needed for itching. 90 tablet 1   loratadine (CLARITIN) 10 MG tablet Take 1 tablet (10 mg total) by mouth daily. 30 tablet 5   montelukast  (SINGULAIR) 10 MG tablet TAKE 1 TABLET(10 MG) BY MOUTH AT BEDTIME 30 tablet 5   nebivolol (BYSTOLIC) 5 MG tablet TAKE 1 TABLET(5 MG) BY MOUTH DAILY 30 tablet 3   ondansetron (ZOFRAN) 4 MG tablet Take 1 tablet (4 mg total) by mouth every 8 (eight) hours as needed for nausea or vomiting. 20 tablet 0   Oxcarbazepine (TRILEPTAL) 300 MG tablet Take 1 tablet (300 mg total) by mouth 2 (two) times daily. 180 tablet 1   oxyCODONE (OXY IR/ROXICODONE) 5 MG  immediate release tablet Take 1 tablet (5 mg total) by mouth every 6 (six) hours as needed for severe pain (Chronic knee osteoarthritis). 30 tablet 0   polyethylene glycol (MIRALAX / GLYCOLAX) 17 g packet Take 17 g by mouth daily as needed for moderate constipation.     potassium chloride SA (KLOR-CON M) 20 MEQ tablet Take 1 tablet (20 mEq total) by mouth 2 (two) times daily. 128 tablet 3   Spacer/Aero-Holding [facility] Use with inhaler. 1 each 2   spironolactone (ALDACTONE) 25 MG tablet TAKE 1 TABLET(25 MG) BY MOUTH DAILY 90 tablet 0   tirzepatide (MOUNJARO) 12.5 MG/0.5ML Pen Inject 12.5 mg into the skin once a week. 2 mL 3   torsemide (DEMADEX) 20 MG tablet Take 2 tablets (40 mg total) by mouth 2 (two) times daily. 360 tablet 2   triamcinolone ointment (KENALOG) 0.5 % Apply 1 Application topically as needed (knee pain).     VENTOLIN HFA 108 (90 Base) MCG/ACT inhaler Inhale 2 puffs into the lungs every 4 (four) hours as needed for wheezing.     traMADol (ULTRAM) 50 MG tablet Take 1 tablet by mouth every 6 (six) hours as needed.     No current facility-administered medications for this encounter.   Allergies  Allergen Reactions   Penicillins Rash and Other (See Comments)    She was told not to take it anymore. Has patient had a PCN reaction causing immediate rash, facial/tongue/throat swelling, SOB or lightheadedness with hypotension: Yes Has patient had a PCN reaction causing severe rash involving mucus membranes or skin necrosis: No Has patient  had a PCN reaction that required hospitalization: No Has patient had a PCN reaction occurring within the last 10 years: No If all of the above answers are "NO", then may proceed with Cephalosporin use.'   Shellfish Allergy Anaphylaxis   Sulfonamide Derivatives Anaphylaxis and Other (See Comments)   Hydrochlorothiazide W-Triamterene Other (See Comments)    Hypokalemia   Lotensin [Benazepril Hcl] Hives   Dipyridamole Other (See Comments)    Unknown reaction   Estrogens Other (See Comments)    Unknown reaction   Hydrochlorothiazide Other (See Comments)    Unknown reaction, Hypokalemia?   Latex Other (See Comments)    Unknown reaction - stated previously during surgery gloves had to be changed, but unsure if it is still an allergy or not.    Metronidazole Other (See Comments)    Unknown reaction   Other Itching    PICKLES   Spironolactone Other (See Comments)    UNSPECIFIED > "kidney problems"   Torsemide Other (See Comments)    Unknown reaction   Valsartan Other (See Comments)    Unknown reaction   Mustard [Allyl Isothiocyanate] Itching   Social History   Socioeconomic History   Marital status: Married    Spouse name: Dwight   Number of children: 1   Years of education: BS   Highest education level: Not on file  Occupational History   Occupation: Disabled    Employer: RETIRED  Tobacco Use   Smoking status: Never   Smokeless tobacco: Never  Vaping Use   Vaping Use: Never used  Substance and Sexual Activity   Alcohol use: No    Alcohol/week: 0.0 standard drinks of alcohol   Drug use: No   Sexual activity: Not Currently  Other Topics Concern   Not on file  Social History Narrative   Patient lives at home with her husband [name] Patient is retired and has a  college education B.S.    Caffeine - some times.   Right handed.   Social Determinants of Health   Financial Resource Strain: Low Risk  (09/15/2021)   Overall Financial Resource Strain (CARDIA)    Difficulty  of Paying Living Expenses: Not hard at all  Food Insecurity: No Food Insecurity (09/15/2021)   Hunger Vital Sign    Worried About Running Out of Food in the Last Year: Never true    Ran Out of Food in the Last Year: Never true  Transportation Needs: No Transportation Needs (09/15/2021)   PRAPARE - Hydrologist (Medical): No    Lack of Transportation (Non-Medical): No  Physical Activity: Inactive (09/15/2021)   Exercise Vital Sign    Days of Exercise per Week: 0 days    Minutes of Exercise per Session: 0 min  Stress: No Stress Concern Present (09/15/2021)   [place]    Feeling of Stress : Not at all  Social Connections: Moderately Integrated (09/15/2021)   Social Connection and Isolation Panel [NHANES]    Frequency of Communication with Friends and Family: More than three times a week    Frequency of Social Gatherings with Friends and Family: Never    Attends Religious Services: More than 4 times per year    Active Member of [employer] or Organizations: No    Attends Archivist Meetings: Never    Marital Status: Married  Human resources officer Violence: Not At Risk (09/15/2021)   Humiliation, Afraid, Rape, and Kick questionnaire    Fear of Current or Ex-Partner: No    Emotionally Abused: No    Physically Abused: No    Sexually Abused: No   Family History  Problem Relation Age of Onset   Colon cancer Mother    Prostate cancer Father    Colon cancer Father    Diabetes Maternal Aunt    Breast cancer Maternal Aunt    Diabetes Maternal Uncle    Diabetes Paternal Aunt    Stroke Paternal Aunt        > 18   Heart disease Paternal Aunt    Diabetes Paternal Uncle    Breast cancer Maternal Aunt         X 2   Breast cancer Cousin    BP 108/70   Pulse 92   SpO2 95%   Wt Readings from Last 3 Encounters:  09/03/21 (!) 155.2 kg (342 lb 3.2 oz)  08/29/21 (!) 152.4 kg (336 lb)  08/15/21 (!)  148.8 kg (328 lb)   PHYSICAL EXAM: General:  NAD. No resp difficulty, arrived in [facility] on oxygen HEENT: Normal Neck: Supple. No JVD. Carotids 2+ bilat; no bruits. No lymphadenopathy or thryomegaly appreciated. Cor: PMI nondisplaced. Regular rate & rhythm. No rubs, gallops or murmurs. Lungs: Clear, diminished in bases Abdomen: Obese, nontender, nondistended. No hepatosplenomegaly. No bruits or masses. Good bowel sounds. Extremities: No cyanosis, clubbing, rash, edema; chronic BLE lymphedema Neuro: Alert & oriented x 3, cranial nerves grossly intact. Moves all 4 extremities w/o difficulty. Affect pleasant.  ECG (personally reviewed): NSR 90 bpm  ASSESSMENT & PLAN: 1. Chronic Diastolic Heart Failure  - RHC 2008 c/w mild pulmonary hypertension, primarily due to high output state.  Her mean pulmonary artery pressure was 27, wedge pressure was 10. PVR was 1.80 [place] units. - Echo (2017): LVEF 65-70%, G1DD, RV normal - Recent admit for a/c dCHF. Volume improved w/ IV Lasix.  - Echo (  4/22): LVEF 60-65%, G2DD  - Echo (10/22): EF 65%, Grade 1 DD, RV normal. - RHC (10/22): w/ mild-mod PH, minimally elevated PCWP (18) - NYHA IIIb, functional status difficult due to body habitus, asthma/chronic resp failure, and physical deconditioning. Volume looks OK on exam. She is unable to stand for a weight. - Continue TEDs. - Continue torsemide 40 mg bid for now. Off metolazone with worsening kidney function.  - Continue Farxiga 10 mg daily. - Continue spiro 25 mg daily.  - Continue Bystolic 5 mg daily.  - Labs today. May need to back down on torsemide or spiro if SCr remains elevated. - Could consider Furoscix, but would like to hold of with CKD and limited mobility.  2. Chronic Hypoxic Respiratory Failure - Suspect driven by OHS, split night sleep study showed no OSA. - Continue BiPAP qhs & continue oxygen during the day. - RHC (10/22): c/w mid-mod PH.  - Respiratory status stable. - Continue oxygen.    3. Bilateral LE DVT (Diagnosed 4/22) - Confirmed by venous dopplers. Chest CT negative for PE. - Now on Eliquis. No bleeding issues.   4. CAD:  - LHC in 2008 showed 50-60% LAD lesion and 30% RCA lesion. - No s/s angina. - Continue ? blocker and statin. - No ASA given Eliquis for DVT tx.    5. Stage IIIb CKD - Baseline SCr ~1.5. - Followed by Dr. [name] at [facility]. - BMET today.  Follow up in 3 months with Dr. Haroldine Laws, as scheduled.  Rafael Bihari, FNP  2:34 PM

## 2021-10-03 NOTE — Patient Instructions (Addendum)
EKG done today.  Labs done today. We will contact you only if your labs are abnormal.  No medication changes were made. Please continue all current medications as prescribed.  Your physician recommends that you schedule a follow-up appointment in: 3 months  If you have any questions or concerns before your next appointment please send us a message through mychart or call our office at [phone].    TO LEAVE A MESSAGE FOR THE NURSE SELECT OPTION 2, PLEASE LEAVE A MESSAGE INCLUDING: YOUR NAME DATE OF BIRTH CALL BACK NUMBER REASON FOR CALL**this is important as we prioritize the call backs  YOU WILL RECEIVE A CALL BACK THE SAME DAY AS LONG AS YOU CALL BEFORE 4:00 PM   Do the following things EVERYDAY: Weigh yourself in the morning before breakfast. Write it down and keep it in a log. Take your medicines as prescribed Eat low salt foods--Limit salt (sodium) to 2000 mg per day.  Stay as active as you can everyday Limit all fluids for the day to less than 2 liters   At the [facility], you and your health needs are our priority. As part of our continuing mission to provide you with exceptional heart care, we have created designated Provider Care Teams. These Care Teams include your primary Cardiologist (physician) and Advanced Practice Providers (APPs- Physician Assistants and Nurse Practitioners) who all work together to provide you with the care you need, when you need it.   You may see any of the following providers on your designated Care Team at your next follow up: Dr Daniel Bensimhon Dr Dalton McLean Amy Clegg, NP Brittainy Simmons, PA Lauren Kemp, PharmD   Please be sure to bring in all your medications bottles to every appointment.   

## 2021-10-03 NOTE — Patient Instructions (Addendum)
If you are age 71 or older, your body mass index should be between 23-30. Your There is no height or weight on file to calculate BMI. If this is out of the aforementioned range listed, please consider follow up with your Primary Care Provider.  __________________________________________________________  The [facility] providers would like to encourage you to use [facility] to communicate with providers for non-urgent requests or questions.  Due to long hold times on the telephone, sending your provider a message by [facility] may be a faster and more efficient way to get a response.  Please allow 48 business hours for a response.  Please remember that this is for non-urgent requests.   Due to recent changes in healthcare laws, you may see the results of your imaging and laboratory studies on MyChart before your provider has had a chance to review them.  We understand that in some cases there may be results that are confusing or concerning to you. Not all laboratory results come back in the same time frame and the provider may be waiting for multiple results in order to interpret others.  Please give [place] 48 hours in order for your provider to thoroughly review all the results before contacting the office for clarification of your results.    Please take Miralax 1 dose on the days of taking iron.  Call us if your symptom gets worse.  Follow up as needed.  Thank you for choosing me and [facility].

## 2021-10-03 NOTE — Addendum Note (Signed)
Encounter addended by: Rafael Bihari, FNP on: 10/03/2021 4:40 PM  Actions taken: Clinical Note Signed

## 2021-10-03 NOTE — Telephone Encounter (Signed)
-----   Message from Rafael Bihari, [place] sent at 10/03/2021  2:13 PM EDT ----- Kidney function improving.   Decrease torsemide to 40 mg q/am and 20 mg q pm.    Repeat BMET in 10-14 days.  Will forward results to her PCP and Dr. [name], per patient's request

## 2021-10-03 NOTE — Progress Notes (Signed)
Patient here for monthly B12 injection per Dr. Burns. B12 1000 mcg given in right IM and patient tolerated injection well today.  

## 2021-10-03 NOTE — Telephone Encounter (Signed)
I called patient to review results and recommendations per provider.  She is aware and agreeable.  I have updated medlist in [facility] and she will return for labs on Friday 15th per her request.

## 2021-10-09 DIAGNOSIS — Z0289 Encounter for other administrative examinations: Secondary | ICD-10-CM

## 2021-10-10 ENCOUNTER — Ambulatory Visit: Payer: Medicare PPO | Admitting: Gastroenterology

## 2021-10-10 NOTE — Telephone Encounter (Signed)
Pt is scheduled to pick up shoes next week.

## 2021-10-13 NOTE — Progress Notes (Signed)
Addendum: Reviewed and agree with assessment and management plan. Brahim Dolman M, MD  

## 2021-10-14 ENCOUNTER — Ambulatory Visit: Payer: Medicare PPO | Admitting: Podiatry

## 2021-10-17 ENCOUNTER — Ambulatory Visit (INDEPENDENT_AMBULATORY_CARE_PROVIDER_SITE_OTHER): Payer: Medicare PPO | Admitting: Podiatry

## 2021-10-17 ENCOUNTER — Ambulatory Visit (HOSPITAL_COMMUNITY)
Admission: RE | Admit: 2021-10-17 | Discharge: 2021-10-17 | Disposition: A | Discharge status: Home / Self Care | Payer: Medicare PPO | Source: Ambulatory Visit | Attending: Cardiology | Admitting: Cardiology

## 2021-10-17 DIAGNOSIS — M79674 Pain in right toe(s): Secondary | ICD-10-CM | POA: Diagnosis not present

## 2021-10-17 DIAGNOSIS — B351 Tinea unguium: Secondary | ICD-10-CM

## 2021-10-17 DIAGNOSIS — I5022 Chronic systolic (congestive) heart failure: Secondary | ICD-10-CM | POA: Diagnosis not present

## 2021-10-17 DIAGNOSIS — I5032 Chronic diastolic (congestive) heart failure: Secondary | ICD-10-CM

## 2021-10-17 DIAGNOSIS — E1142 Type 2 diabetes mellitus with diabetic polyneuropathy: Secondary | ICD-10-CM

## 2021-10-17 DIAGNOSIS — M2142 Flat foot [pes planus] (acquired), left foot: Secondary | ICD-10-CM | POA: Diagnosis not present

## 2021-10-17 DIAGNOSIS — M79675 Pain in left toe(s): Secondary | ICD-10-CM | POA: Diagnosis not present

## 2021-10-17 DIAGNOSIS — Z7901 Long term (current) use of anticoagulants: Secondary | ICD-10-CM

## 2021-10-17 DIAGNOSIS — M2141 Flat foot [pes planus] (acquired), right foot: Secondary | ICD-10-CM | POA: Diagnosis not present

## 2021-10-17 LAB — BASIC METABOLIC PANEL
Anion gap: 9 (ref 5–15)
BUN: 26 mg/dL — ABNORMAL HIGH (ref 8–23)
CO2: 30 mmol/L (ref 22–32)
Calcium: 9.9 mg/dL (ref 8.9–10.3)
Chloride: 100 mmol/L (ref 98–111)
Creatinine, Ser: 2.21 mg/dL — ABNORMAL HIGH (ref 0.44–1.00)
GFR, Estimated: 23 mL/min — ABNORMAL LOW (ref >60)
Glucose, Bld: 96 mg/dL (ref 70–99)
Potassium: 4.6 mmol/L (ref 3.5–5.1)
Sodium: 139 mmol/L (ref 135–145)

## 2021-10-17 NOTE — Progress Notes (Unsigned)
Subjective: 71 y.o. returns the office today for painful, elongated, thickened toenails which she cannot trim herself. Denies any redness or drainage around the nails. Denies any acute changes since last appointment and no new complaints today. Denies any systemic complaints such as fevers, chills, nausea, vomiting.  On Eliquis   PCP: [name], MD Last Seen: 09/15/2021  A1c: 6.2 on 08/29/2021  Objective: AAO 3, NAD- in wheelchair DP/PT pulses decreased Chronic lower extremity edema present.  Sensation decreased as well as to monofilament. Nails hypertrophic, dystrophic, elongated, brittle, discolored 10. There is tenderness overlying the nails 1-5 bilaterally. There is no surrounding erythema or drainage along the nail sites. No open lesions or pre-ulcerative lesions are identified. No other areas of tenderness bilateral lower extremities. No overlying edema, erythema, increased warmth. No pain with calf compression, swelling, warmth, erythema.  Assessment: Patient presents with symptomatic onychomycosis  Plan: -Treatment options including alternatives, risks, complications were discussed -Nails sharply debrided 10 without complication/bleeding. -Discussed daily foot inspection. If there are any changes, to call the office immediately.  -Follow-up in 3 months or sooner if any problems are to arise. In the meantime, encouraged to call the office with any questions, concerns, changes symptoms.  Celesta Gentile, DPM

## 2021-10-17 NOTE — Addendum Note (Signed)
Encounter addended by: Kerry Dory, CMA on: 10/17/2021 10:57 AM  Actions taken: Clinical Note Signed

## 2021-10-17 NOTE — Progress Notes (Signed)
During patients lab appt- Pt voiced concerns of increased weight Reports weight has been slowly increasing since medication change. (3lbs over night). Unable to provide actual number for reference   Mild LE edmea -she is currently wearing  compression stockings. Increased in fatigue however has completed 5 MD appts in the past 24 hours.   Advised some weight gain can be expected with decrease in diuretics. Continue to monitor and further medication changes if needed would come after repeat labs are reviewed.  Pt voiced understanding and appreciation verbalized to step out to speak with her.

## 2021-10-21 ENCOUNTER — Other Ambulatory Visit (HOSPITAL_COMMUNITY): Payer: Self-pay | Admitting: *Deleted

## 2021-10-21 MED ORDER — POTASSIUM CHLORIDE CRYS ER 20 MEQ PO TBCR: 20.0000 meq | EXTENDED_RELEASE_TABLET | Freq: Two times a day (BID) | ORAL | 3 refills | Status: DC

## 2021-10-22 ENCOUNTER — Other Ambulatory Visit (HOSPITAL_COMMUNITY): Payer: Self-pay

## 2021-10-22 ENCOUNTER — Other Ambulatory Visit (HOSPITAL_COMMUNITY): Payer: Self-pay | Admitting: *Deleted

## 2021-10-22 MED ORDER — POTASSIUM CHLORIDE CRYS ER 20 MEQ PO TBCR
20.0000 meq | EXTENDED_RELEASE_TABLET | Freq: Two times a day (BID) | ORAL | 3 refills | Status: DC
  Filled 2021-10-22 (×2): qty 180, 90d supply, fill #0

## 2021-10-24 ENCOUNTER — Other Ambulatory Visit (HOSPITAL_COMMUNITY): Payer: Self-pay

## 2021-10-24 ENCOUNTER — Encounter (HOSPITAL_COMMUNITY): Payer: Medicare PPO

## 2021-10-27 ENCOUNTER — Other Ambulatory Visit (HOSPITAL_COMMUNITY): Payer: Self-pay

## 2021-11-07 ENCOUNTER — Ambulatory Visit (INDEPENDENT_AMBULATORY_CARE_PROVIDER_SITE_OTHER): Payer: Medicare PPO

## 2021-11-07 DIAGNOSIS — M1712 Unilateral primary osteoarthritis, left knee: Secondary | ICD-10-CM | POA: Diagnosis not present

## 2021-11-07 DIAGNOSIS — M1711 Unilateral primary osteoarthritis, right knee: Secondary | ICD-10-CM | POA: Diagnosis not present

## 2021-11-07 DIAGNOSIS — E538 Deficiency of other specified B group vitamins: Secondary | ICD-10-CM | POA: Diagnosis not present

## 2021-11-07 DIAGNOSIS — Z23 Encounter for immunization: Secondary | ICD-10-CM | POA: Diagnosis not present

## 2021-11-07 MED ORDER — CYANOCOBALAMIN 1000 MCG/ML IJ SOLN
1000.0000 ug | Freq: Once | INTRAMUSCULAR | Status: AC
  Administered 2021-11-07: 1000 ug via INTRAMUSCULAR

## 2021-11-07 NOTE — Progress Notes (Signed)
B12 and Flu shot given. Pt tolerated injections well.

## 2021-11-08 ENCOUNTER — Other Ambulatory Visit: Payer: Self-pay | Admitting: Internal Medicine

## 2021-11-10 ENCOUNTER — Telehealth: Payer: Self-pay | Admitting: Internal Medicine

## 2021-11-10 ENCOUNTER — Other Ambulatory Visit: Payer: Self-pay

## 2021-11-10 MED ORDER — APIXABAN 2.5 MG PO TABS: 2.5000 mg | ORAL_TABLET | Freq: Two times a day (BID) | ORAL | 1 refills | Status: DC

## 2021-11-10 NOTE — Telephone Encounter (Signed)
Sent in today 

## 2021-11-10 NOTE — Telephone Encounter (Signed)
Patient needs her Eliquis 2.5 - Please send to [facility] on [address]

## 2021-11-11 ENCOUNTER — Other Ambulatory Visit: Payer: Self-pay

## 2021-11-11 MED ORDER — APIXABAN 2.5 MG PO TABS: 2.5000 mg | ORAL_TABLET | Freq: Two times a day (BID) | ORAL | 1 refills | Status: DC

## 2021-11-11 NOTE — Telephone Encounter (Signed)
Re-faxed today.

## 2021-11-11 NOTE — Telephone Encounter (Signed)
Patient states pharmacy does not have the prescription and asked if we can re fax this.

## 2021-11-14 DIAGNOSIS — D631 Anemia in chronic kidney disease: Secondary | ICD-10-CM | POA: Diagnosis not present

## 2021-11-14 DIAGNOSIS — N1831 Chronic kidney disease, stage 3a: Secondary | ICD-10-CM | POA: Diagnosis not present

## 2021-11-14 DIAGNOSIS — I739 Peripheral vascular disease, unspecified: Secondary | ICD-10-CM | POA: Diagnosis not present

## 2021-11-14 DIAGNOSIS — N184 Chronic kidney disease, stage 4 (severe): Secondary | ICD-10-CM | POA: Diagnosis not present

## 2021-11-14 DIAGNOSIS — N2581 Secondary hyperparathyroidism of renal origin: Secondary | ICD-10-CM | POA: Diagnosis not present

## 2021-11-14 DIAGNOSIS — I82409 Acute embolism and thrombosis of unspecified deep veins of unspecified lower extremity: Secondary | ICD-10-CM | POA: Diagnosis not present

## 2021-11-14 DIAGNOSIS — E1122 Type 2 diabetes mellitus with diabetic chronic kidney disease: Secondary | ICD-10-CM | POA: Diagnosis not present

## 2021-11-14 DIAGNOSIS — I5032 Chronic diastolic (congestive) heart failure: Secondary | ICD-10-CM | POA: Diagnosis not present

## 2021-11-19 ENCOUNTER — Telehealth: Payer: Self-pay

## 2021-11-19 NOTE — Telephone Encounter (Signed)
Patient received the [place] Covid vaccine on 10/13.

## 2021-11-20 ENCOUNTER — Other Ambulatory Visit: Payer: Self-pay | Admitting: *Deleted

## 2021-11-20 DIAGNOSIS — M7989 Other specified soft tissue disorders: Secondary | ICD-10-CM

## 2021-11-21 ENCOUNTER — Ambulatory Visit: Payer: Medicare PPO | Admitting: Pulmonary Disease

## 2021-11-21 ENCOUNTER — Encounter: Payer: Self-pay | Admitting: Pulmonary Disease

## 2021-11-21 VITALS — BP 106/80 | HR 98 | Temp 97.7°F | Ht 62.0 in | Wt 327.0 lb

## 2021-11-21 DIAGNOSIS — J9612 Chronic respiratory failure with hypercapnia: Secondary | ICD-10-CM | POA: Diagnosis not present

## 2021-11-21 DIAGNOSIS — E662 Morbid (severe) obesity with alveolar hypoventilation: Secondary | ICD-10-CM | POA: Diagnosis not present

## 2021-11-21 DIAGNOSIS — R0602 Shortness of breath: Secondary | ICD-10-CM

## 2021-11-21 DIAGNOSIS — M1711 Unilateral primary osteoarthritis, right knee: Secondary | ICD-10-CM | POA: Diagnosis not present

## 2021-11-21 DIAGNOSIS — J9611 Chronic respiratory failure with hypoxia: Secondary | ICD-10-CM

## 2021-11-21 NOTE — Patient Instructions (Signed)
Continue using the BiPAP at night  Continue oxygen use  Graded exercise as tolerated  Continue weight loss efforts  We will see you back in 3 months  Call us with significant concerns

## 2021-11-21 NOTE — Progress Notes (Signed)
April [name]    [id number]    1950-11-27  Primary Care Physician:Burns, Claudina Lick, MD  Referring Physician: [name], MD [place],  [place] [id number]  Chief complaint:    Shortness of breath Chronic respiratory failure Pulmonary hypertension  HPI:  Continues to do well Some weight loss  Denies significant symptoms at present Difficulty with ambulation, knee problem, shortness of breath with activity  Continues to use Breztri  Uses BiPAP nightly for hypercapnic respiratory failure POC during the day-usually needs it for ambulation Oxygen still drops with activity  Goes to bed about 11-12, wake up time between 730 and 9 AM Gets decent sleep She is using a BiPAP at night  Only able to take a few steps because of chronic knee issues  She is managing to lose some weight recently  Last sleep studies did not reveal significant obstructive sleep apnea  Has a history of congestive heart failure, history of asthma, diabetes, hypercholesterolemia, obesity hypoventilation History of DVTs Never smoked  Outpatient Encounter Medications as of 11/21/2021  Medication Sig   acetaminophen (TYLENOL) 650 MG CR tablet Take 1,300 mg by mouth every 8 (eight) hours as needed for pain.   Alcohol Swabs (DROPSAFE ALCOHOL PREP) 70 % PADS USE TOPICALLY AS DIRECTED   allopurinol (ZYLOPRIM) 100 MG tablet TAKE 2 TABLETS(200 MG) BY MOUTH DAILY   apixaban (ELIQUIS) 2.5 MG TABS tablet Take 1 tablet (2.5 mg total) by mouth 2 (two) times daily.   atorvastatin (LIPITOR) 40 MG tablet Take 1 tablet (40 mg total) by mouth at bedtime.   Blood Glucose Calibration (TRUE METRIX LEVEL 1) Low SOLN UAD  E11.9   Blood Glucose Monitoring Suppl (TRUE METRIX METER) DEVI True Metrix Level 1 solution   Budeson-Glycopyrrol-Formoterol (BREZTRI AEROSPHERE) 160-9-4.8 MCG/ACT AERO Inhale 2 puffs into the lungs in the morning and at bedtime.   calcium carbonate (TUMS - DOSED IN MG  ELEMENTAL CALCIUM) 500 MG chewable tablet Chew 2 tablets by mouth daily as needed for indigestion or heartburn.   cyanocobalamin (,VITAMIN B-12,) 1000 MCG/ML injection INJECT 1ML INTO MUSCLE EVERY 30 DAYS   dapagliflozin propanediol (FARXIGA) 10 MG TABS tablet Take 10 mg by mouth daily.   diclofenac Sodium (VOLTAREN) 1 % GEL Apply 1 application topically as needed (pain).   diphenhydrAMINE (BENADRYL) 12.5 MG/5ML liquid Take 25 mg by mouth 4 (four) times daily as needed for allergies.   eszopiclone (LUNESTA) 1 MG TABS tablet Take 1 mg by mouth at bedtime as needed for sleep. Take immediately before bedtime   famotidine (PEPCID) 20 MG tablet Take 1 tablet (20 mg total) by mouth 2 (two) times daily.   FEROSUL 325 (65 Fe) MG tablet TAKE 1 TABLET BY MOUTH DAILY ON(MONDAYS WEDNESDAY AND FRIDAYS)   fluticasone (FLONASE) 50 MCG/ACT nasal spray Place 2 sprays into both nostrils daily.   gabapentin (NEURONTIN) 100 MG capsule TAKE 1 CAPSULE(100 MG) BY MOUTH THREE TIMES DAILY   guaiFENesin-dextromethorphan (ROBITUSSIN DM) 100-10 MG/5ML syrup Take 5 mLs by mouth every 4 (four) hours as needed for cough.   hydrOXYzine (ATARAX) 25 MG tablet Take 1 tablet (25 mg total) by mouth 3 (three) times daily as needed for itching.   loratadine (CLARITIN) 10 MG tablet Take 1 tablet (10 mg total) by mouth daily.   montelukast (SINGULAIR) 10 MG tablet TAKE 1 TABLET(10 MG) BY MOUTH AT BEDTIME   nebivolol (BYSTOLIC) 5 MG tablet TAKE 1 TABLET(5 MG) BY MOUTH  DAILY   ondansetron (ZOFRAN) 4 MG tablet Take 1 tablet (4 mg total) by mouth every 8 (eight) hours as needed for nausea or vomiting.   Oxcarbazepine (TRILEPTAL) 300 MG tablet Take 1 tablet (300 mg total) by mouth 2 (two) times daily.   oxyCODONE (OXY IR/ROXICODONE) 5 MG immediate release tablet Take 1 tablet (5 mg total) by mouth every 6 (six) hours as needed for severe pain (Chronic knee osteoarthritis).   polyethylene glycol (MIRALAX / GLYCOLAX) 17 g packet Take 17 g by  mouth daily as needed for moderate constipation.   potassium chloride SA (KLOR-CON M) 20 MEQ tablet Take 1 tablet (20 mEq total) by mouth 2 (two) times daily.   Spacer/Aero-Holding Dorise Bullion Use with inhaler.   spironolactone (ALDACTONE) 25 MG tablet TAKE 1 TABLET(25 MG) BY MOUTH DAILY   tirzepatide (MOUNJARO) 12.5 MG/0.5ML Pen Inject 12.5 mg into the skin once a week.   torsemide (DEMADEX) 20 MG tablet Take 40 mg in AM and 20 mg in PM   traMADol (ULTRAM) 50 MG tablet Take 1 tablet by mouth every 6 (six) hours as needed.   triamcinolone ointment (KENALOG) 0.5 % Apply 1 Application topically as needed (knee pain).   VENTOLIN HFA 108 (90 Base) MCG/ACT inhaler Inhale 2 puffs into the lungs every 4 (four) hours as needed for wheezing.   No facility-administered encounter medications on file as of 11/21/2021.    Allergies as of 11/21/2021 - Review Complete 11/21/2021  Allergen Reaction Noted   Penicillins Rash and Other (See Comments) 01/08/2010   Shellfish allergy Anaphylaxis 03/31/2018   Sulfonamide derivatives Anaphylaxis and Other (See Comments) 01/08/2010   Hydrochlorothiazide w-triamterene Other (See Comments) 01/08/2010   Lotensin [benazepril hcl] Hives 05/25/2017   Dipyridamole Other (See Comments) 05/25/2017   Estrogens Other (See Comments) 05/25/2017   Hydrochlorothiazide Other (See Comments) 05/25/2017   Latex Other (See Comments) 05/08/2020   Metronidazole Other (See Comments)    Other Itching 08/12/2018   Spironolactone Other (See Comments)    Torsemide Other (See Comments)    Valsartan Other (See Comments)    Mustard [allyl isothiocyanate] Itching 03/31/2018    Past Medical History:  Diagnosis Date   Anemia    Asthma    CAD (coronary artery disease)    Carpal tunnel syndrome    Cellulitis of both lower extremities 04/11/2015   CHF (congestive heart failure) (HCC)    Chronic kidney disease    [facility]- followed by [facility]   Colon polyp, hyperplastic 5277 &  8242   Complication of anesthesia 1999   svt with renal calculi surgery, no problems since   CTS (carpal tunnel syndrome)    bilateral   Diabetes mellitus    Eczema    GERD (gastroesophageal reflux disease)    History of kidney stones 1999   Hyperlipidemia    Hypertension    Leg ulcer ([place]) 04/24/2015   Right lateral leg No evidence of an infection Monitor closely Keep edema controlled    Leg ulcer (HCC)    right lower   Meralgia paresthetica    Dr. [name]   Morbid obesity ([place])    Neuropathy    toes and legs   Osteoarthrosis, unspecified whether generalized or localized, lower leg    knee   PUD (peptic ulcer disease)    Shortness of breath dyspnea    with exertion   Sleep apnea    per progress note 02/25/2018   Type II or unspecified type diabetes mellitus without mention of  complication, not stated as uncontrolled    Unspecified hereditary and idiopathic peripheral neuropathy    Urticaria    Vitamin B12 deficiency    Wears glasses    Wound cellulitis    right upper leg, healing well    Past Surgical History:  Procedure Laterality Date   ABDOMINAL HYSTERECTOMY     BIOPSY  03/20/2021   Procedure: BIOPSY;  Surgeon: Jerene Bears, MD;  Location: [facility];  Service: Gastroenterology;;   BREAST BIOPSY Right 07/08/2018   CARDIAC CATHETERIZATION  2002   non obstructive disease   colonoscopy with polypectomy  2007 & 2012    hyperplastic ;Dr Watt Climes   COLONOSCOPY WITH PROPOFOL N/A 06/04/2015   Procedure: COLONOSCOPY WITH PROPOFOL;  Surgeon: Jerene Bears, MD;  Location: [facility];  Service: Gastroenterology;  Laterality: N/A;   COLONOSCOPY WITH PROPOFOL N/A 03/20/2021   Procedure: COLONOSCOPY WITH PROPOFOL;  Surgeon: Jerene Bears, MD;  Location: [facility];  Service: Gastroenterology;  Laterality: N/A;   DEBRIDEMENT LEG Right 03/02/2018   WOUND VAC APPLIED   DEBRIDEMENT LEG Right 05/27/2018   RIGHT LOWER LEG DEBRIDEMENT,  SKIN GRAFT, VAC PLACEMENT    DILATION AND  CURETTAGE OF UTERUS     multiple   ESOPHAGOGASTRODUODENOSCOPY (EGD) WITH PROPOFOL N/A 03/20/2021   Procedure: ESOPHAGOGASTRODUODENOSCOPY (EGD) WITH PROPOFOL;  Surgeon: Jerene Bears, MD;  Location: [facility];  Service: Gastroenterology;  Laterality: N/A;   HEMORRHOID SURGERY     HEMORROIDECTOMY     I & D EXTREMITY Right 03/02/2018   Procedure: RIGHT LEG DEBRIDEMENT AND PLACE VAC;  Surgeon: [name], MD;  Location: [facility];  Service: Orthopedics;  Laterality: Right;   I & D EXTREMITY Right 03/04/2018   Procedure: REPEAT IRRIGATION AND DEBRIDEMENT RIGHT LEG, PLACE WOUND VAC;  Surgeon: [name], MD;  Location: [place];  Service: Orthopedics;  Laterality: Right;   I & D EXTREMITY Right 03/30/2018   Procedure: IRRIGATION AND DEBRIDEMENT RIGHT LEG, APPLY WOUND VAC;  Surgeon: [name], MD;  Location: [place];  Service: Orthopedics;  Laterality: Right;   I & D EXTREMITY Right 05/27/2018   Procedure: RIGHT LOWER LEG DEBRIDEMENT,  SKIN GRAFT, VAC PLACEMENT;  Surgeon: [name], MD;  Location: [place];  Service: Orthopedics;  Laterality: Right;  RIGHT LOWER LEG DEBRIDEMENT,  SKIN GRAFT, VAC PLACEMENT   POLYPECTOMY  03/20/2021   Procedure: POLYPECTOMY;  Surgeon: Jerene Bears, MD;  Location: [facility];  Service: Gastroenterology;;   renal calculi  12/1997   SVT with induction of anesthesia   RIGHT HEART CATH N/A 11/19/2020   Procedure: RIGHT HEART CATH;  Surgeon: Jolaine Artist, MD;  Location: [facility] [place];  Service: Cardiovascular;  Laterality: N/A;   right knee arthroscopy     SKIN SPLIT GRAFT Right 03/04/2018   Procedure: POSSIBLE SPLIT THICKNESS SKIN GRAFT;  Surgeon: [name], MD;  Location: [place];  Service: Orthopedics;  Laterality: Right;   SKIN SPLIT GRAFT Right 04/01/2018   Procedure: REPEAT IRRIGATION AND DEBRIDEMENT RIGHT LEG, APPLY SPLIT THICKNESS SKIN GRAFT;  Surgeon: [name], MD;  Location: [place];  Service: Orthopedics;  Laterality: Right;    TONSILLECTOMY AND ADENOIDECTOMY      Family History  Problem Relation Age of Onset   Colon cancer Mother    Prostate cancer Father    Colon cancer Father    Diabetes Maternal Aunt    Breast cancer Maternal Aunt    Diabetes Maternal Uncle  Diabetes Paternal Aunt    Stroke Paternal Aunt        > 29   Heart disease Paternal Aunt    Diabetes Paternal Uncle    Breast cancer Maternal Aunt         X 2   Breast cancer Cousin     Social History   Socioeconomic History   Marital status: Married    Spouse name: Dwight   Number of children: 1   Years of education: BS   Highest education level: Not on file  Occupational History   Occupation: Disabled    Employer: RETIRED  Tobacco Use   Smoking status: Never   Smokeless tobacco: Never  Vaping Use   Vaping Use: Never used  Substance and Sexual Activity   Alcohol use: No    Alcohol/week: 0.0 standard drinks of alcohol   Drug use: No   Sexual activity: Not Currently  Other Topics Concern   Not on file  Social History Narrative   Patient lives at home with her husband [name] Patient is retired and has a Conservation officer, nature.    Caffeine - some times.   Right handed.   Social Determinants of Health   Financial Resource Strain: Low Risk  (09/15/2021)   Overall Financial Resource Strain (CARDIA)    Difficulty of Paying Living Expenses: Not hard at all  Food Insecurity: No Food Insecurity (09/15/2021)   Hunger Vital Sign    Worried About Running Out of Food in the Last Year: Never true    Ran Out of Food in the Last Year: Never true  Transportation Needs: No Transportation Needs (09/15/2021)   PRAPARE - Hydrologist (Medical): No    Lack of Transportation (Non-Medical): No  Physical Activity: Inactive (09/15/2021)   Exercise Vital Sign    Days of Exercise per Week: 0 days    Minutes of Exercise per Session: 0 min  Stress: No Stress Concern Present (09/15/2021)   [place]    Feeling of Stress : Not at all  Social Connections: Moderately Integrated (09/15/2021)   Social Connection and Isolation Panel [NHANES]    Frequency of Communication with Friends and Family: More than three times a week    Frequency of Social Gatherings with Friends and Family: Never    Attends Religious Services: More than 4 times per year    Active Member of [employer] or Organizations: No    Attends Archivist Meetings: Never    Marital Status: Married  Human resources officer Violence: Not At Risk (09/15/2021)   Humiliation, Afraid, Rape, and Kick questionnaire    Fear of Current or Ex-Partner: No    Emotionally Abused: No    Physically Abused: No    Sexually Abused: No    Review of Systems  Constitutional:  Negative for fatigue.  Respiratory:  Negative for apnea.   Psychiatric/Behavioral:  Positive for sleep disturbance.     Vitals:   11/21/21 1009  BP: 106/80  Pulse: 98  Temp: 97.7 F (36.5 C)  SpO2: 92%     Physical Exam Constitutional:      Appearance: She is obese.  HENT:     Head: Normocephalic.     Nose: No congestion.     Mouth/Throat:     Mouth: Mucous membranes are moist.  Eyes:     Pupils: Pupils are equal, round, and reactive to light.  Cardiovascular:  Rate and Rhythm: Normal rate and regular rhythm.     Heart sounds: No murmur heard.    No friction rub.  Pulmonary:     Effort: No respiratory distress.     Breath sounds: No stridor. No wheezing or rhonchi.  Musculoskeletal:     Cervical back: No rigidity or tenderness.  Neurological:     Mental Status: She is alert.  Psychiatric:        Mood and Affect: Mood normal.   Data Reviewed: Most recent sleep study negative for significant sleep apnea, did show oxygen desaturations  Blood gases in the hospital showing hypercapnia  Echocardiogram April 2022 does reveal normal ejection fraction of 60 to 65% with right ventricular systolic  pressure of 66 Severe pulmonary hypertension  Assessment:  Pulmonary hypertension -Likely related to diastolic heart failure, obesity hypoventilation, chronic nocturnal desaturations  Sleep onset and sleep maintenance insomnia  Severe deconditioning  Chronic knee pain  History of obstructive sleep apnea   Diabetes, hypertension, hyperlipidemia, mild to moderate coronary artery disease  Plan/Recommendations: Continue auto BiPAP nightly  Continue Lunesta  Continue cautious diuresis  Graded exercise as tolerated  Encouraged to call with significant concerns  Importance of weight management discussed  We will try to contact [employer] to get compliance download  Continue to use oxygen supplementation as needed  Follow-up in 3 months     Sherrilyn Rist MD [facility] 11/21/2021, 10:32 AM  CC: [name], MD

## 2021-11-24 NOTE — Telephone Encounter (Signed)
Documented already

## 2021-11-25 ENCOUNTER — Other Ambulatory Visit: Payer: Self-pay

## 2021-11-25 ENCOUNTER — Telehealth: Payer: Self-pay | Admitting: Internal Medicine

## 2021-11-25 ENCOUNTER — Other Ambulatory Visit (HOSPITAL_COMMUNITY): Payer: Self-pay

## 2021-11-25 MED ORDER — TIRZEPATIDE 12.5 MG/0.5ML ~~LOC~~ SOAJ
12.5000 mg | SUBCUTANEOUS | 3 refills | Status: DC
Start: 2021-11-25 — End: 2021-12-29
  Filled 2021-11-25: qty 2, 28d supply, fill #0
  Filled 2021-12-29: qty 2, 28d supply, fill #1

## 2021-11-25 NOTE — Telephone Encounter (Signed)
Pt request refill sent to [facility] for [facility] 12.5.

## 2021-11-25 NOTE — Telephone Encounter (Signed)
Faxed in today. 

## 2021-11-27 ENCOUNTER — Ambulatory Visit (HOSPITAL_COMMUNITY)
Admission: RE | Admit: 2021-11-27 | Discharge: 2021-11-27 | Disposition: A | Discharge status: Home / Self Care | Payer: Medicare PPO | Source: Ambulatory Visit | Attending: Vascular Surgery | Admitting: Vascular Surgery

## 2021-11-27 DIAGNOSIS — M7989 Other specified soft tissue disorders: Secondary | ICD-10-CM | POA: Insufficient documentation

## 2021-11-27 NOTE — Progress Notes (Signed)
Office Note     CC: Intermittent cold foot with history of trauma and skin graft Requesting Provider:  Madelon Lips, MD  HPI: April [name] is a 71 y.o. (05/29/1950) female presenting at the request of .April Rail, MD for right lower extremity coolness.  On exam today, April [name] was doing well, accompanied by her husband.  She has struggled with her weight over the years and currently has a BMI of 60.  She can stand and ambulate for short distances but is otherwise in a wheelchair.  She denies symptoms of claudication due to the inability to walk long distances, rest pain, tissue loss of the toes.  Had a traumatic fall which required significant reconstructive surgery with Dr. [name] in an effort to heal her right calf.  She describes the cold feeling in the right lower extremity is waxing and waning.  Sometimes if she sleeps with a heater by her feet.  She denies color changes, pain.  The pt is  on a statin for cholesterol management.  The pt is not on a daily aspirin.   Other AC:  eliquis The pt is  on medication for hypertension.   The pt is  diabetic.  Tobacco hx:  none  Past Medical History:  Diagnosis Date   Anemia    Asthma    CAD (coronary artery disease)    Carpal tunnel syndrome    Cellulitis of both lower extremities 04/11/2015   CHF (congestive heart failure) (HCC)    Chronic kidney disease    [facility]- followed by [facility]   Colon polyp, hyperplastic 3710 & [id number]   Complication of anesthesia 1999   svt with renal calculi surgery, no problems since   CTS (carpal tunnel syndrome)    bilateral   Diabetes mellitus    Eczema    GERD (gastroesophageal reflux disease)    History of kidney stones 1999   Hyperlipidemia    Hypertension    Leg ulcer ([place]) 04/24/2015   Right lateral leg No evidence of an infection Monitor closely Keep edema controlled    Leg ulcer ([place])    right lower   Meralgia paresthetica    Dr. [name]   Morbid obesity ([place])    Neuropathy     toes and legs   Osteoarthrosis, unspecified whether generalized or localized, lower leg    knee   PUD (peptic ulcer disease)    Shortness of breath dyspnea    with exertion   Sleep apnea    per progress note 02/25/2018   Type II or unspecified type diabetes mellitus without mention of complication, not stated as uncontrolled    Unspecified hereditary and idiopathic peripheral neuropathy    Urticaria    Vitamin B12 deficiency    Wears glasses    Wound cellulitis    right upper leg, healing well    Past Surgical History:  Procedure Laterality Date   ABDOMINAL HYSTERECTOMY     BIOPSY  03/20/2021   Procedure: BIOPSY;  Surgeon: Jerene Bears, MD;  Location: [facility];  Service: Gastroenterology;;   BREAST BIOPSY Right 07/08/2018   CARDIAC CATHETERIZATION  2002   non obstructive disease   colonoscopy with polypectomy  2007 & 2012    hyperplastic ;Dr Watt Climes   COLONOSCOPY WITH PROPOFOL N/A 06/04/2015   Procedure: COLONOSCOPY WITH PROPOFOL;  Surgeon: Jerene Bears, MD;  Location: [facility];  Service: Gastroenterology;  Laterality: N/A;   COLONOSCOPY WITH PROPOFOL N/A 03/20/2021   Procedure: COLONOSCOPY WITH PROPOFOL;  Surgeon: Jerene Bears, MD;  Location: [facility];  Service: Gastroenterology;  Laterality: N/A;   DEBRIDEMENT LEG Right 03/02/2018   WOUND VAC APPLIED   DEBRIDEMENT LEG Right 05/27/2018   RIGHT LOWER LEG DEBRIDEMENT,  SKIN GRAFT, VAC PLACEMENT    DILATION AND CURETTAGE OF UTERUS     multiple   ESOPHAGOGASTRODUODENOSCOPY (EGD) WITH PROPOFOL N/A 03/20/2021   Procedure: ESOPHAGOGASTRODUODENOSCOPY (EGD) WITH PROPOFOL;  Surgeon: Jerene Bears, MD;  Location: [facility];  Service: Gastroenterology;  Laterality: N/A;   HEMORRHOID SURGERY     HEMORROIDECTOMY     I & D EXTREMITY Right 03/02/2018   Procedure: RIGHT LEG DEBRIDEMENT AND PLACE VAC;  Surgeon: [name], MD;  Location: [facility];  Service: Orthopedics;  Laterality: Right;   I & D EXTREMITY Right 03/04/2018    Procedure: REPEAT IRRIGATION AND DEBRIDEMENT RIGHT LEG, PLACE WOUND VAC;  Surgeon: [name], MD;  Location: [place];  Service: Orthopedics;  Laterality: Right;   I & D EXTREMITY Right 03/30/2018   Procedure: IRRIGATION AND DEBRIDEMENT RIGHT LEG, APPLY WOUND VAC;  Surgeon: [name], MD;  Location: [place];  Service: Orthopedics;  Laterality: Right;   I & D EXTREMITY Right 05/27/2018   Procedure: RIGHT LOWER LEG DEBRIDEMENT,  SKIN GRAFT, VAC PLACEMENT;  Surgeon: [name], MD;  Location: [place];  Service: Orthopedics;  Laterality: Right;  RIGHT LOWER LEG DEBRIDEMENT,  SKIN GRAFT, VAC PLACEMENT   POLYPECTOMY  03/20/2021   Procedure: POLYPECTOMY;  Surgeon: Jerene Bears, MD;  Location: [facility];  Service: Gastroenterology;;   renal calculi  12/1997   SVT with induction of anesthesia   RIGHT HEART CATH N/A 11/19/2020   Procedure: RIGHT HEART CATH;  Surgeon: Jolaine Artist, MD;  Location: [facility];  Service: Cardiovascular;  Laterality: N/A;   right knee arthroscopy     SKIN SPLIT GRAFT Right 03/04/2018   Procedure: POSSIBLE SPLIT THICKNESS SKIN GRAFT;  Surgeon: [name], MD;  Location: [place];  Service: Orthopedics;  Laterality: Right;   SKIN SPLIT GRAFT Right 04/01/2018   Procedure: REPEAT IRRIGATION AND DEBRIDEMENT RIGHT LEG, APPLY SPLIT THICKNESS SKIN GRAFT;  Surgeon: [name], MD;  Location: [facility];  Service: Orthopedics;  Laterality: Right;   TONSILLECTOMY AND ADENOIDECTOMY      Social History   Socioeconomic History   Marital status: Married    Spouse name: April [name]   Number of children: 1   Years of education: BS   Highest education level: Not on file  Occupational History   Occupation: Disabled    Employer: RETIRED  Tobacco Use   Smoking status: Never   Smokeless tobacco: Never  Vaping Use   Vaping Use: Never used  Substance and Sexual Activity   Alcohol use: No    Alcohol/week: 0.0 standard drinks of alcohol   Drug use: No   Sexual  activity: Not Currently  Other Topics Concern   Not on file  Social History Narrative   Patient lives at home with her husband April [name]) . Patient is retired and has a Conservation officer, nature.    Caffeine - some times.   Right handed.   Social Determinants of Health   Financial Resource Strain: Low Risk  (09/15/2021)   Overall Financial Resource Strain (CARDIA)    Difficulty of Paying Living Expenses: Not hard at all  Food Insecurity: No Food Insecurity (09/15/2021)   Hunger Vital Sign    Worried About Running Out of  Food in the Last Year: Never true    [place] in the Last Year: Never true  Transportation Needs: No Transportation Needs (09/15/2021)   PRAPARE - Hydrologist (Medical): No    Lack of Transportation (Non-Medical): No  Physical Activity: Inactive (09/15/2021)   Exercise Vital Sign    Days of Exercise per Week: 0 days    Minutes of Exercise per Session: 0 min  Stress: No Stress Concern Present (09/15/2021)   [place]    Feeling of Stress : Not at all  Social Connections: Moderately Integrated (09/15/2021)   Social Connection and Isolation Panel [NHANES]    Frequency of Communication with Friends and Family: More than three times a week    Frequency of Social Gatherings with Friends and Family: Never    Attends Religious Services: More than 4 times per year    Active Member of [employer] or Organizations: No    Attends Archivist Meetings: Never    Marital Status: Married  Human resources officer Violence: Not At Risk (09/15/2021)   Humiliation, Afraid, Rape, and Kick questionnaire    Fear of Current or Ex-Partner: No    Emotionally Abused: No    Physically Abused: No    Sexually Abused: No   Family History  Problem Relation Age of Onset   Colon cancer Mother    Prostate cancer Father    Colon cancer Father    Diabetes Maternal Aunt    Breast cancer Maternal Aunt     Diabetes Maternal Uncle    Diabetes Paternal Aunt    Stroke Paternal Aunt        > 49   Heart disease Paternal Aunt    Diabetes Paternal Uncle    Breast cancer Maternal Aunt         X 2   Breast cancer Cousin     Current Outpatient Medications  Medication Sig Dispense Refill   acetaminophen (TYLENOL) 650 MG CR tablet Take 1,300 mg by mouth every 8 (eight) hours as needed for pain.     Alcohol Swabs (DROPSAFE ALCOHOL PREP) 70 % PADS USE TOPICALLY AS DIRECTED 300 each 3   allopurinol (ZYLOPRIM) 100 MG tablet TAKE 2 TABLETS(200 MG) BY MOUTH DAILY 180 tablet 1   apixaban (ELIQUIS) 2.5 MG TABS tablet Take 1 tablet (2.5 mg total) by mouth 2 (two) times daily. 180 tablet 1   atorvastatin (LIPITOR) 40 MG tablet Take 1 tablet (40 mg total) by mouth at bedtime. 90 tablet 2   Blood Glucose Calibration (TRUE METRIX LEVEL 1) Low SOLN UAD  E11.9 1 each 3   Blood Glucose Monitoring Suppl (TRUE METRIX METER) DEVI True Metrix Level 1 solution     Budeson-Glycopyrrol-Formoterol (BREZTRI AEROSPHERE) 160-9-4.8 MCG/ACT AERO Inhale 2 puffs into the lungs in the morning and at bedtime. 10.7 g 0   calcium carbonate (TUMS - DOSED IN MG ELEMENTAL CALCIUM) 500 MG chewable tablet Chew 2 tablets by mouth daily as needed for indigestion or heartburn.     cyanocobalamin (,VITAMIN B-12,) 1000 MCG/ML injection INJECT 1ML INTO MUSCLE EVERY 30 DAYS 10 mL 5   dapagliflozin propanediol (FARXIGA) 10 MG TABS tablet Take 10 mg by mouth daily.     diclofenac Sodium (VOLTAREN) 1 % GEL Apply 1 application topically as needed (pain).     diphenhydrAMINE (BENADRYL) 12.5 MG/5ML liquid Take 25 mg by mouth 4 (four) times daily as needed for allergies.  eszopiclone (LUNESTA) 1 MG TABS tablet Take 1 mg by mouth at bedtime as needed for sleep. Take immediately before bedtime     famotidine (PEPCID) 20 MG tablet Take 1 tablet (20 mg total) by mouth 2 (two) times daily. 180 tablet 3   FEROSUL 325 (65 Fe) MG tablet TAKE 1 TABLET BY MOUTH  DAILY ON(MONDAYS WEDNESDAY AND FRIDAYS) 60 tablet 0   fluticasone (FLONASE) 50 MCG/ACT nasal spray Place 2 sprays into both nostrils daily. 18.2 mL 2   gabapentin (NEURONTIN) 100 MG capsule TAKE 1 CAPSULE(100 MG) BY MOUTH THREE TIMES DAILY 90 capsule 1   guaiFENesin-dextromethorphan (ROBITUSSIN DM) 100-10 MG/5ML syrup Take 5 mLs by mouth every 4 (four) hours as needed for cough.     hydrOXYzine (ATARAX) 25 MG tablet Take 1 tablet (25 mg total) by mouth 3 (three) times daily as needed for itching. 90 tablet 1   loratadine (CLARITIN) 10 MG tablet Take 1 tablet (10 mg total) by mouth daily. 30 tablet 5   montelukast (SINGULAIR) 10 MG tablet TAKE 1 TABLET(10 MG) BY MOUTH AT BEDTIME 30 tablet 5   nebivolol (BYSTOLIC) 5 MG tablet TAKE 1 TABLET(5 MG) BY MOUTH DAILY 30 tablet 3   ondansetron (ZOFRAN) 4 MG tablet Take 1 tablet (4 mg total) by mouth every 8 (eight) hours as needed for nausea or vomiting. 20 tablet 0   Oxcarbazepine (TRILEPTAL) 300 MG tablet Take 1 tablet (300 mg total) by mouth 2 (two) times daily. 180 tablet 1   oxyCODONE (OXY IR/ROXICODONE) 5 MG immediate release tablet Take 1 tablet (5 mg total) by mouth every 6 (six) hours as needed for severe pain (Chronic knee osteoarthritis). 30 tablet 0   polyethylene glycol (MIRALAX / GLYCOLAX) 17 g packet Take 17 g by mouth daily as needed for moderate constipation.     potassium chloride SA (KLOR-CON M) 20 MEQ tablet Take 1 tablet (20 mEq total) by mouth 2 (two) times daily. 180 tablet 3   Spacer/Aero-Holding [facility] Use with inhaler. 1 each 2   spironolactone (ALDACTONE) 25 MG tablet TAKE 1 TABLET(25 MG) BY MOUTH DAILY 90 tablet 0   tirzepatide (MOUNJARO) 12.5 MG/0.5ML Pen Inject 12.5 mg into the skin once a week. 2 mL 3   torsemide (DEMADEX) 20 MG tablet Take 40 mg in AM and 20 mg in PM 360 tablet 3   traMADol (ULTRAM) 50 MG tablet Take 1 tablet by mouth every 6 (six) hours as needed.     triamcinolone ointment (KENALOG) 0.5 % Apply 1  Application topically as needed (knee pain).     VENTOLIN HFA 108 (90 Base) MCG/ACT inhaler Inhale 2 puffs into the lungs every 4 (four) hours as needed for wheezing.     No current facility-administered medications for this visit.    Allergies  Allergen Reactions   Penicillins Rash and Other (See Comments)    She was told not to take it anymore. Has patient had a PCN reaction causing immediate rash, facial/tongue/throat swelling, SOB or lightheadedness with hypotension: Yes Has patient had a PCN reaction causing severe rash involving mucus membranes or skin necrosis: No Has patient had a PCN reaction that required hospitalization: No Has patient had a PCN reaction occurring within the last 10 years: No If all of the above answers are "NO", then may proceed with Cephalosporin use.'   Shellfish Allergy Anaphylaxis   Sulfonamide Derivatives Anaphylaxis and Other (See Comments)   Hydrochlorothiazide W-Triamterene Other (See Comments)    Hypokalemia  Lotensin [Benazepril Hcl] Hives   Dipyridamole Other (See Comments)    Unknown reaction   Estrogens Other (See Comments)    Unknown reaction   Hydrochlorothiazide Other (See Comments)    Unknown reaction, Hypokalemia?   Latex Other (See Comments)    Unknown reaction - stated previously during surgery gloves had to be changed, but unsure if it is still an allergy or not.    Metronidazole Other (See Comments)    Unknown reaction   Other Itching    PICKLES   Spironolactone Other (See Comments)    UNSPECIFIED > "kidney problems"   Torsemide Other (See Comments)    Unknown reaction   Valsartan Other (See Comments)    Unknown reaction   Mustard [Allyl Isothiocyanate] Itching     REVIEW OF SYSTEMS:  '[X]'$  denotes positive finding, '[ ]'$  denotes negative finding Cardiac  Comments:  Chest pain or chest pressure:    Shortness of breath upon exertion:    Short of breath when lying flat:    Irregular heart rhythm:        Vascular    Pain  in calf, thigh, or hip brought on by ambulation:    Pain in feet at night that wakes you up from your sleep:     Blood clot in your veins:    Leg swelling:         Pulmonary    Oxygen at home:    Productive cough:     Wheezing:         Neurologic    Sudden weakness in arms or legs:     Sudden numbness in arms or legs:     Sudden onset of difficulty speaking or slurred speech:    Temporary loss of vision in one eye:     Problems with dizziness:         Gastrointestinal    Blood in stool:     Vomited blood:         Genitourinary    Burning when urinating:     Blood in urine:        Psychiatric    Major depression:         Hematologic    Bleeding problems:    Problems with blood clotting too easily:        Skin    Rashes or ulcers:        Constitutional    Fever or chills:      PHYSICAL EXAMINATION:  There were no vitals filed for this visit.  General:  WDWN in NAD; vital signs documented above Gait: Not observed HENT: WNL, normocephalic Pulmonary: normal non-labored breathing , without wheezing Cardiac: regular HR Abdomen: soft, NT, no masses Skin: without rashes Vascular Exam/Pulses:  Right Left  Radial 2+ (normal) 2+ (normal)  Ulnar    Femoral    Popliteal    DP 2+ (normal) 2+ (normal)  PT     Extremities: without ischemic changes, without Gangrene , without cellulitis; without open wounds;  Musculoskeletal: no muscle wasting or atrophy  Neurologic: A&O X 3;  No focal weakness or paresthesias are detected Psychiatric:  The pt has Normal affect.   Non-Invasive Vascular Imaging:      +-------+-----------+-----------+------------+------------+  ABI/TBIToday's ABIToday's TBIPrevious ABIPrevious TBI  +-------+-----------+-----------+------------+------------+  Right  0.64       0.58       0.99        0.82          +-------+-----------+-----------+------------+------------+  Left  0.80       0.65       0.99        0.76           +-------+-----------+-----------+------------+------------+     ASSESSMENT/PLAN: April [name] is a 71 y.o. female presenting with waxing waning right lower extremity coolness.  ABIs were reviewed demonstrating mild to moderate peripheral arterial disease bilaterally.  Values may be skewed due to the patient's morbid obesity, and arterial calcification.  Toe pressures are adequate for wound healing.  On physical exam, she had a palpable dorsalis pedis bilaterally.  I had a long conversation with April [name] regarding the above.  I do not have a good etiology for the waxing and waning cool foot.  She does not have signs or symptoms concerning for Raynaud's.  The coolness is not associated with vascular insufficiency. Part of our discussion we about her obesity.  She stated she has been losing weight slowly, which I congratulated her on.  We discussed that diabetes and chronic kidney disease cause microvascular damage which can lead to limb loss.  At her current size, lack of mobility and with the wound healing problems she has already faced, she is not a candidate for open surgery.  My plan is to see her on a yearly basis to continue to follow her peripheral arterial disease.  She was asked to call if any rest pain or tissue loss occur.   Recommend the following which can slow the progression of atherosclerosis and reduce the risk of major adverse cardiac / limb events:  Aspirin '81mg'$  PO QD.  Atorvastatin 40-'80mg'$  PO QD (or other "high intensity" statin therapy). Complete cessation from all tobacco products. Blood glucose control with goal A1c < 7%. Blood pressure control with goal blood pressure < 140/90 mmHg. Lipid reduction therapy with goal LDL-C <100 mg/dL (<70 if symptomatic from PAD).     Broadus John, MD [facility] [phone]

## 2021-11-28 ENCOUNTER — Encounter: Payer: Self-pay | Admitting: Vascular Surgery

## 2021-11-28 ENCOUNTER — Ambulatory Visit: Payer: Medicare PPO | Admitting: Vascular Surgery

## 2021-11-28 VITALS — BP 130/80 | HR 86 | Temp 98.0°F | Resp 20 | Ht 62.0 in | Wt 327.0 lb

## 2021-11-28 DIAGNOSIS — I739 Peripheral vascular disease, unspecified: Secondary | ICD-10-CM

## 2021-12-01 ENCOUNTER — Telehealth: Payer: Self-pay | Admitting: Internal Medicine

## 2021-12-01 ENCOUNTER — Other Ambulatory Visit: Payer: Self-pay

## 2021-12-01 ENCOUNTER — Other Ambulatory Visit: Payer: Self-pay | Admitting: Internal Medicine

## 2021-12-01 MED ORDER — NEBIVOLOL HCL 5 MG PO TABS: ORAL_TABLET | ORAL | 3 refills | Status: DC

## 2021-12-01 NOTE — Telephone Encounter (Signed)
Faxed today

## 2021-12-01 NOTE — Telephone Encounter (Signed)
Patient needs a refill on her nebivolol - '5mg'$ . - please send to [facility] on [address], [place].

## 2021-12-06 ENCOUNTER — Other Ambulatory Visit: Payer: Self-pay | Admitting: Internal Medicine

## 2021-12-12 ENCOUNTER — Ambulatory Visit (INDEPENDENT_AMBULATORY_CARE_PROVIDER_SITE_OTHER): Payer: Medicare PPO

## 2021-12-12 DIAGNOSIS — E538 Deficiency of other specified B group vitamins: Secondary | ICD-10-CM

## 2021-12-12 MED ORDER — CYANOCOBALAMIN 1000 MCG/ML IJ SOLN
1000.0000 ug | Freq: Once | INTRAMUSCULAR | Status: AC
  Administered 2021-12-12: 1000 ug via INTRAMUSCULAR

## 2021-12-12 NOTE — Progress Notes (Signed)
Patient here for monthly B12 injection per Dr. Quay Burow. B12 1000 mcg given in right  IM and patient tolerated injection well today.

## 2021-12-16 DIAGNOSIS — I5033 Acute on chronic diastolic (congestive) heart failure: Secondary | ICD-10-CM | POA: Diagnosis not present

## 2021-12-16 DIAGNOSIS — J9602 Acute respiratory failure with hypercapnia: Secondary | ICD-10-CM | POA: Diagnosis not present

## 2021-12-16 DIAGNOSIS — J9601 Acute respiratory failure with hypoxia: Secondary | ICD-10-CM | POA: Diagnosis not present

## 2021-12-19 ENCOUNTER — Ambulatory Visit: Payer: Medicare PPO | Admitting: Physician Assistant

## 2021-12-19 ENCOUNTER — Other Ambulatory Visit: Payer: Medicare PPO

## 2021-12-27 ENCOUNTER — Other Ambulatory Visit: Payer: Self-pay | Admitting: Internal Medicine

## 2021-12-28 ENCOUNTER — Encounter: Payer: Self-pay | Admitting: Internal Medicine

## 2021-12-28 NOTE — Patient Instructions (Addendum)
     Try Hibiclens soap to help prevent the boils.     Blood work was ordered.   The lab is on the first floor.    Medications changes include :   none    Return in about 6 months (around 06/30/2022) for Physical Exam.

## 2021-12-28 NOTE — Progress Notes (Signed)
Subjective:    Patient ID: April [name], female    DOB: 1950-03-12, 71 y.o.   MRN: [id number]     HPI April [name] is here for follow up of her chronic medical problems, including htn, b/l LE DVT on eliquis, HFpEF, DM, GERD, chronic knee and back pain, neuropathy, h/o gout, CKD, hld, B12 def, leg edema, OP  Has intermittent boils - has one in vaginal area and often gets them in the buttock region. Will see derm this week.   She continues to have chronic knee pain which limits her greatly.  There are many things that she is not able to do and needs assistance with.  Medications and allergies reviewed with patient and updated if appropriate.  Current Outpatient Medications on File Prior to Visit  Medication Sig Dispense Refill   acetaminophen (TYLENOL) 650 MG CR tablet Take 1,300 mg by mouth every 8 (eight) hours as needed for pain.     Alcohol Swabs (DROPSAFE ALCOHOL PREP) 70 % PADS USE TOPICALLY AS DIRECTED 300 each 3   allopurinol (ZYLOPRIM) 100 MG tablet TAKE 2 TABLETS(200 MG) BY MOUTH DAILY 180 tablet 1   apixaban (ELIQUIS) 2.5 MG TABS tablet Take 1 tablet (2.5 mg total) by mouth 2 (two) times daily. 180 tablet 1   atorvastatin (LIPITOR) 40 MG tablet TAKE 1 TABLET(40 MG) BY MOUTH AT BEDTIME 90 tablet 2   Blood Glucose Calibration (TRUE METRIX LEVEL 1) Low SOLN UAD  E11.9 1 each 3   Blood Glucose Monitoring Suppl (TRUE METRIX METER) DEVI True Metrix Level 1 solution     Budeson-Glycopyrrol-Formoterol (BREZTRI AEROSPHERE) 160-9-4.8 MCG/ACT AERO Inhale 2 puffs into the lungs in the morning and at bedtime. 10.7 g 0   calcium carbonate (TUMS - DOSED IN MG ELEMENTAL CALCIUM) 500 MG chewable tablet Chew 2 tablets by mouth daily as needed for indigestion or heartburn.     cyanocobalamin (,VITAMIN B-12,) 1000 MCG/ML injection INJECT 1ML INTO MUSCLE EVERY 30 DAYS 10 mL 5   dapagliflozin propanediol (FARXIGA) 10 MG TABS tablet Take 10 mg by mouth daily.     diclofenac Sodium (VOLTAREN) 1 %  GEL Apply 1 application topically as needed (pain).     diphenhydrAMINE (BENADRYL) 12.5 MG/5ML liquid Take 25 mg by mouth 4 (four) times daily as needed for allergies.     eszopiclone (LUNESTA) 1 MG TABS tablet Take 1 mg by mouth at bedtime as needed for sleep. Take immediately before bedtime     famotidine (PEPCID) 20 MG tablet TAKE 1 TABLET(20 MG) BY MOUTH TWICE DAILY 180 tablet 3   FEROSUL 325 (65 Fe) MG tablet TAKE 1 TABLET BY MOUTH DAILY ON MONDAY, WEDNESDAY AND FRIDAY 60 tablet 0   fluticasone (FLONASE) 50 MCG/ACT nasal spray Place 2 sprays into both nostrils daily. 18.2 mL 2   gabapentin (NEURONTIN) 100 MG capsule TAKE 1 CAPSULE(100 MG) BY MOUTH THREE TIMES DAILY 90 capsule 1   guaiFENesin-dextromethorphan (ROBITUSSIN DM) 100-10 MG/5ML syrup Take 5 mLs by mouth every 4 (four) hours as needed for cough.     hydrOXYzine (ATARAX) 25 MG tablet Take 1 tablet (25 mg total) by mouth 3 (three) times daily as needed for itching. 90 tablet 1   loratadine (CLARITIN) 10 MG tablet Take 1 tablet (10 mg total) by mouth daily. 30 tablet 5   montelukast (SINGULAIR) 10 MG tablet TAKE 1 TABLET(10 MG) BY MOUTH AT BEDTIME 30 tablet 5   nebivolol (BYSTOLIC) 5 MG tablet TAKE 1 TABLET(5  MG) BY MOUTH DAILY 30 tablet 3   ondansetron (ZOFRAN) 4 MG tablet Take 1 tablet (4 mg total) by mouth every 8 (eight) hours as needed for nausea or vomiting. 20 tablet 0   Oxcarbazepine (TRILEPTAL) 300 MG tablet Take 1 tablet (300 mg total) by mouth 2 (two) times daily. 180 tablet 1   oxyCODONE (OXY IR/ROXICODONE) 5 MG immediate release tablet Take 1 tablet (5 mg total) by mouth every 6 (six) hours as needed for severe pain (Chronic knee osteoarthritis). 30 tablet 0   polyethylene glycol (MIRALAX / GLYCOLAX) 17 g packet Take 17 g by mouth daily as needed for moderate constipation.     potassium chloride SA (KLOR-CON M) 20 MEQ tablet Take 1 tablet (20 mEq total) by mouth 2 (two) times daily. 180 tablet 3   Spacer/Aero-Holding [facility] Use with inhaler. 1 each 2   spironolactone (ALDACTONE) 25 MG tablet TAKE 1 TABLET(25 MG) BY MOUTH DAILY 90 tablet 0   tirzepatide (MOUNJARO) 12.5 MG/0.5ML Pen Inject 12.5 mg into the skin once a week. 2 mL 3   torsemide (DEMADEX) 20 MG tablet Take 40 mg in AM and 20 mg in PM 360 tablet 3   triamcinolone ointment (KENALOG) 0.5 % Apply 1 Application topically as needed (knee pain).     VENTOLIN HFA 108 (90 Base) MCG/ACT inhaler Inhale 2 puffs into the lungs every 4 (four) hours as needed for wheezing.     No current facility-administered medications on file prior to visit.     Review of Systems  Constitutional:  Negative for fever.  Respiratory:  Positive for cough (related to asthma - dry cough), shortness of breath and wheezing (occ - asthma related).   Cardiovascular:  Positive for palpitations (occ) and leg swelling (doing better). Negative for chest pain.  Neurological:  Negative for light-headedness and headaches.       Objective:   Vitals:   12/30/21 0942  BP: 136/78  Pulse: 68  Temp: 98.7 F (37.1 C)  SpO2: 97%   BP Readings from Last 3 Encounters:  12/30/21 136/78  11/28/21 130/80  11/21/21 106/80   Wt Readings from Last 3 Encounters:  12/30/21 (!) 325 lb (147.4 kg)  11/28/21 (!) 327 lb (148.3 kg)  11/21/21 (!) 327 lb (148.3 kg)   Body mass index is 59.44 kg/m.    Physical Exam Constitutional:      General: She is not in acute distress.    Appearance: Normal appearance.  HENT:     Head: Normocephalic and atraumatic.  Eyes:     Conjunctiva/sclera: Conjunctivae normal.  Cardiovascular:     Rate and Rhythm: Normal rate and regular rhythm.     Heart sounds: Normal heart sounds. No murmur heard. Pulmonary:     Effort: Pulmonary effort is normal. No respiratory distress.     Breath sounds: Normal breath sounds. No wheezing.  Musculoskeletal:     Cervical back: Neck supple.     Right lower leg: Edema (with chronic skin changes) present.     Left lower  leg: Edema (with chronic skin changes) present.  Lymphadenopathy:     Cervical: No cervical adenopathy.  Skin:    General: Skin is warm and dry.     Findings: No rash.  Neurological:     Mental Status: She is alert. Mental status is at baseline.  Psychiatric:        Mood and Affect: Mood normal.        Behavior: Behavior normal.  Lab Results  Component Value Date   WBC 5.4 08/29/2021   HGB 12.6 08/29/2021   HCT 40.6 08/29/2021   PLT 150.0 08/29/2021   GLUCOSE 96 10/17/2021   CHOL 157 08/29/2021   TRIG 82.0 08/29/2021   HDL 35.20 (L) 08/29/2021   LDLDIRECT 169.4 05/02/2007   LDLCALC 106 (H) 08/29/2021   ALT 14 08/29/2021   AST 15 08/29/2021   NA 139 10/17/2021   K 4.6 10/17/2021   CL 100 10/17/2021   CREATININE 2.21 (H) 10/17/2021   BUN 26 (H) 10/17/2021   CO2 30 10/17/2021   TSH 4.317 11/15/2020   INR 1.0 07/04/2006   HGBA1C 6.2 08/29/2021   MICROALBUR 1.8 08/29/2021     Assessment & Plan:    See Problem List for Assessment and Plan of chronic medical problems.

## 2021-12-29 ENCOUNTER — Telehealth: Payer: Self-pay | Admitting: Internal Medicine

## 2021-12-29 ENCOUNTER — Other Ambulatory Visit (HOSPITAL_COMMUNITY): Payer: Self-pay

## 2021-12-29 ENCOUNTER — Other Ambulatory Visit: Payer: Self-pay

## 2021-12-29 MED ORDER — TIRZEPATIDE 12.5 MG/0.5ML ~~LOC~~ SOAJ: 12.5000 mg | SUBCUTANEOUS | 3 refills | Status: DC

## 2021-12-29 NOTE — Telephone Encounter (Signed)
Caller: (patient ),  Relationship: (self )  Call Back Number: [phone]  Date of Last Office Visit:  08/29/21  Date of Next Office Visit: Not scheduled  Medication(s) to be Refilled: MOUNJARO 12.5  Last Written: 11/25/21   Preferred Pharmacy:  [facility], [place] [address] Phone: [phone]  Fax: [phone]

## 2021-12-29 NOTE — Telephone Encounter (Signed)
Sent today

## 2021-12-30 ENCOUNTER — Ambulatory Visit: Payer: Medicare PPO | Admitting: Internal Medicine

## 2021-12-30 ENCOUNTER — Other Ambulatory Visit (HOSPITAL_COMMUNITY): Payer: Self-pay

## 2021-12-30 VITALS — BP 136/78 | HR 68 | Temp 98.7°F | Ht 62.0 in | Wt 325.0 lb

## 2021-12-30 DIAGNOSIS — Z86718 Personal history of other venous thrombosis and embolism: Secondary | ICD-10-CM

## 2021-12-30 DIAGNOSIS — K219 Gastro-esophageal reflux disease without esophagitis: Secondary | ICD-10-CM | POA: Diagnosis not present

## 2021-12-30 DIAGNOSIS — E1140 Type 2 diabetes mellitus with diabetic neuropathy, unspecified: Secondary | ICD-10-CM | POA: Diagnosis not present

## 2021-12-30 DIAGNOSIS — M109 Gout, unspecified: Secondary | ICD-10-CM | POA: Diagnosis not present

## 2021-12-30 DIAGNOSIS — R600 Localized edema: Secondary | ICD-10-CM | POA: Diagnosis not present

## 2021-12-30 DIAGNOSIS — G609 Hereditary and idiopathic neuropathy, unspecified: Secondary | ICD-10-CM

## 2021-12-30 DIAGNOSIS — M170 Bilateral primary osteoarthritis of knee: Secondary | ICD-10-CM

## 2021-12-30 DIAGNOSIS — I10 Essential (primary) hypertension: Secondary | ICD-10-CM | POA: Diagnosis not present

## 2021-12-30 DIAGNOSIS — E782 Mixed hyperlipidemia: Secondary | ICD-10-CM | POA: Diagnosis not present

## 2021-12-30 DIAGNOSIS — N1832 Chronic kidney disease, stage 3b: Secondary | ICD-10-CM | POA: Diagnosis not present

## 2021-12-30 DIAGNOSIS — M816 Localized osteoporosis [Lequesne]: Secondary | ICD-10-CM

## 2021-12-30 DIAGNOSIS — E538 Deficiency of other specified B group vitamins: Secondary | ICD-10-CM

## 2021-12-30 LAB — COMPREHENSIVE METABOLIC PANEL
ALT: 11 U/L (ref 0–35)
AST: 16 U/L (ref 0–37)
Albumin: 4 g/dL (ref 3.5–5.2)
Alkaline Phosphatase: 95 U/L (ref 39–117)
BUN: 23 mg/dL (ref 6–23)
CO2: 36 mEq/L — ABNORMAL HIGH (ref 19–32)
Calcium: 9.4 mg/dL (ref 8.4–10.5)
Chloride: 98 mEq/L (ref 96–112)
Creatinine, Ser: 1.72 mg/dL — ABNORMAL HIGH (ref 0.40–1.20)
GFR: 29.55 mL/min — ABNORMAL LOW (ref >60.00)
Glucose, Bld: 89 mg/dL (ref 70–99)
Potassium: 4.5 mEq/L (ref 3.5–5.1)
Sodium: 140 mEq/L (ref 135–145)
Total Bilirubin: 0.5 mg/dL (ref 0.2–1.2)
Total Protein: 7.2 g/dL (ref 6.0–8.3)

## 2021-12-30 LAB — CBC WITH DIFFERENTIAL/PLATELET
Basophils Absolute: 0.1 10*3/uL (ref 0.0–0.1)
Basophils Relative: 1.2 % (ref 0.0–3.0)
Eosinophils Absolute: 0.1 10*3/uL (ref 0.0–0.7)
Eosinophils Relative: 1.5 % (ref 0.0–5.0)
HCT: 40.7 % (ref 36.0–46.0)
Hemoglobin: 13 g/dL (ref 12.0–15.0)
Lymphocytes Relative: 20.2 % (ref 12.0–46.0)
Lymphs Abs: 1.3 10*3/uL (ref 0.7–4.0)
MCHC: 32 g/dL (ref 30.0–36.0)
MCV: 100.2 fl — ABNORMAL HIGH (ref 78.0–100.0)
Monocytes Absolute: 0.4 10*3/uL (ref 0.1–1.0)
Monocytes Relative: 6.6 % (ref 3.0–12.0)
Neutro Abs: 4.4 10*3/uL (ref 1.4–7.7)
Neutrophils Relative %: 70.5 % (ref 43.0–77.0)
Platelets: 176 10*3/uL (ref 150.0–400.0)
RBC: 4.06 Mil/uL (ref 3.87–5.11)
RDW: 15.4 % (ref 11.5–15.5)
WBC: 6.2 10*3/uL (ref 4.0–10.5)

## 2021-12-30 LAB — LIPID PANEL
Cholesterol: 165 mg/dL (ref 0–200)
HDL: 35.7 mg/dL — ABNORMAL LOW (ref >39.00)
LDL Cholesterol: 109 mg/dL — ABNORMAL HIGH (ref 0–99)
NonHDL: 129.27
Total CHOL/HDL Ratio: 5
Triglycerides: 99 mg/dL (ref 0.0–149.0)
VLDL: 19.8 mg/dL (ref 0.0–40.0)

## 2021-12-30 LAB — HEMOGLOBIN A1C: Hgb A1c MFr Bld: 5.9 % (ref 4.6–6.5)

## 2021-12-30 NOTE — Assessment & Plan Note (Signed)
Chronic ?Continue monthly B12 injections ? ?

## 2021-12-30 NOTE — Assessment & Plan Note (Signed)
History of DVT 05/2020 She is very sedentary and high risk of recurrence of DVT Continue to Eliquis 2.5 mg twice daily for prevention

## 2021-12-30 NOTE — Assessment & Plan Note (Signed)
Chronic Regular exercise and healthy diet encouraged Check lipid panel  Continue atorvastatin 40 mg daily 

## 2021-12-30 NOTE — Assessment & Plan Note (Signed)
Chronic GERD controlled Continue famotidine 20 mg twice daily

## 2021-12-30 NOTE — Assessment & Plan Note (Addendum)
Chronic   Lab Results  Component Value Date   HGBA1C 6.2 08/29/2021   Sugars well controlled Not testing sugars Check A1c Continue Farxiga 10 mg daily, Mounjaro 12.5 mg weekly Stressed regular exercise, diabetic diet

## 2021-12-30 NOTE — Assessment & Plan Note (Signed)
Chronic Bilateral severe knee osteoarthritis with chronic pain Continue oxycodone 5 mg every 6 hours as needed-she does not take this often

## 2021-12-30 NOTE — Assessment & Plan Note (Signed)
Chronic Following with Dr. [name] Continue April [name] 10 mg daily CBC, CMP Encouraged weight loss

## 2021-12-30 NOTE — Assessment & Plan Note (Signed)
Chronic Continue gabapentin 100 mg 3 times daily, Trileptal 300 mg twice daily

## 2021-12-30 NOTE — Assessment & Plan Note (Signed)
Chronic Denies gout flares Continue allopurinol 200 mg daily

## 2021-12-30 NOTE — Assessment & Plan Note (Signed)
Chronic Stable Continue torsemide 40 mg twice daily spironolactone 25 mg daily

## 2021-12-30 NOTE — Assessment & Plan Note (Addendum)
Chronic Blood pressure controlled She is not compliant with a low-sodium diet-stressed the importance of changing her diet CMP Continue Bystolic 5 mg daily, spironolactone 25 mg daily, torsemide 40 mg twice daily

## 2022-01-01 DIAGNOSIS — L738 Other specified follicular disorders: Secondary | ICD-10-CM | POA: Diagnosis not present

## 2022-01-01 NOTE — Telephone Encounter (Signed)
Info complete

## 2022-01-07 ENCOUNTER — Encounter (HOSPITAL_COMMUNITY): Payer: Medicare PPO | Admitting: Internal Medicine

## 2022-01-08 DIAGNOSIS — E119 Type 2 diabetes mellitus without complications: Secondary | ICD-10-CM | POA: Diagnosis not present

## 2022-01-08 DIAGNOSIS — H25013 Cortical age-related cataract, bilateral: Secondary | ICD-10-CM | POA: Diagnosis not present

## 2022-01-08 DIAGNOSIS — H5213 Myopia, bilateral: Secondary | ICD-10-CM | POA: Diagnosis not present

## 2022-01-08 DIAGNOSIS — H2513 Age-related nuclear cataract, bilateral: Secondary | ICD-10-CM | POA: Diagnosis not present

## 2022-01-08 DIAGNOSIS — H524 Presbyopia: Secondary | ICD-10-CM | POA: Diagnosis not present

## 2022-01-08 LAB — HM DIABETES EYE EXAM

## 2022-01-12 ENCOUNTER — Telehealth: Payer: Self-pay | Admitting: Internal Medicine

## 2022-01-12 NOTE — Telephone Encounter (Signed)
Caller & Relationship to patient:   Call back number:[phone]   Date of last office visit:  12/30/2021   Date of next office visit:  07/03/2022   Medication(s) to be refilled:  Oxycodone        Preferred Pharmacy: [facility] on [address]

## 2022-01-12 NOTE — Telephone Encounter (Signed)
.  rfgv

## 2022-01-13 MED ORDER — OXYCODONE HCL 5 MG PO TABS
5.0000 mg | ORAL_TABLET | Freq: Four times a day (QID) | ORAL | 0 refills | Status: DC | PRN
Start: 2022-01-13 — End: 2022-08-24

## 2022-01-13 NOTE — Addendum Note (Signed)
Addended by: [name] on: 01/13/2022 04:19 PM   Modules accepted: Orders

## 2022-01-15 ENCOUNTER — Ambulatory Visit: Payer: Medicare PPO

## 2022-01-15 DIAGNOSIS — I5033 Acute on chronic diastolic (congestive) heart failure: Secondary | ICD-10-CM | POA: Diagnosis not present

## 2022-01-15 DIAGNOSIS — J9601 Acute respiratory failure with hypoxia: Secondary | ICD-10-CM | POA: Diagnosis not present

## 2022-01-15 DIAGNOSIS — J9602 Acute respiratory failure with hypercapnia: Secondary | ICD-10-CM | POA: Diagnosis not present

## 2022-01-16 ENCOUNTER — Ambulatory Visit: Payer: Medicare PPO | Admitting: Podiatry

## 2022-01-16 ENCOUNTER — Other Ambulatory Visit: Payer: Medicare PPO

## 2022-01-16 ENCOUNTER — Encounter: Payer: Self-pay | Admitting: Podiatry

## 2022-01-16 ENCOUNTER — Ambulatory Visit: Payer: Medicare PPO | Admitting: Physician Assistant

## 2022-01-16 ENCOUNTER — Ambulatory Visit (INDEPENDENT_AMBULATORY_CARE_PROVIDER_SITE_OTHER): Payer: Medicare PPO

## 2022-01-16 DIAGNOSIS — E1142 Type 2 diabetes mellitus with diabetic polyneuropathy: Secondary | ICD-10-CM | POA: Diagnosis not present

## 2022-01-16 DIAGNOSIS — M25572 Pain in left ankle and joints of left foot: Secondary | ICD-10-CM | POA: Diagnosis not present

## 2022-01-16 DIAGNOSIS — M25562 Pain in left knee: Secondary | ICD-10-CM | POA: Diagnosis not present

## 2022-01-16 DIAGNOSIS — M79675 Pain in left toe(s): Secondary | ICD-10-CM

## 2022-01-16 DIAGNOSIS — E538 Deficiency of other specified B group vitamins: Secondary | ICD-10-CM

## 2022-01-16 DIAGNOSIS — M79674 Pain in right toe(s): Secondary | ICD-10-CM

## 2022-01-16 DIAGNOSIS — B351 Tinea unguium: Secondary | ICD-10-CM

## 2022-01-16 DIAGNOSIS — Z7901 Long term (current) use of anticoagulants: Secondary | ICD-10-CM

## 2022-01-16 MED ORDER — CYANOCOBALAMIN 1000 MCG/ML IJ SOLN
1000.0000 ug | Freq: Once | INTRAMUSCULAR | Status: AC
  Administered 2022-01-16: 1000 ug via INTRAMUSCULAR

## 2022-01-16 NOTE — Progress Notes (Unsigned)
Subjective: Chief Complaint  Patient presents with   Diabetes    Diabetic foot care, A1c- 5.9 BG- , Nail trim     71 y.o. returns the office today for painful, elongated, thickened toenails which she cannot trim herself. Denies any redness or drainage around the nails. Denies any acute changes since last appointment and no new complaints today. Denies any systemic complaints such as fevers, chills, nausea, vomiting.  On Eliquis   PCP: [name], MD Last Seen: 09/15/2021  A1c: 6.2 on 08/29/2021  Objective: AAO 3, NAD- in wheelchair DP/PT pulses decreased Chronic lower extremity edema present.  Sensation decreased as well as to monofilament. Nails hypertrophic, dystrophic, elongated, brittle, discolored 10. There is tenderness overlying the nails 1-5 bilaterally. There is no surrounding erythema or drainage along the nail sites. No open lesions or pre-ulcerative lesions are identified. No other areas of tenderness bilateral lower extremities. No overlying edema, erythema, increased warmth. No pain with calf compression, swelling, warmth, erythema.  Assessment: Patient presents with symptomatic onychomycosis  Plan: -Treatment options including alternatives, risks, complications were discussed -Nails sharply debrided 10 without complication/bleeding. -Discussed daily foot inspection. If there are any changes, to call the office immediately.  -Follow-up in 3 months or sooner if any problems are to arise. In the meantime, encouraged to call the office with any questions, concerns, changes symptoms.  Celesta Gentile, DPM

## 2022-01-16 NOTE — Progress Notes (Signed)
Pt received B12 shot with no complications. 

## 2022-01-22 DIAGNOSIS — M25572 Pain in left ankle and joints of left foot: Secondary | ICD-10-CM | POA: Insufficient documentation

## 2022-01-23 ENCOUNTER — Telehealth: Payer: Self-pay | Admitting: Internal Medicine

## 2022-01-23 ENCOUNTER — Other Ambulatory Visit: Payer: Self-pay

## 2022-01-23 ENCOUNTER — Other Ambulatory Visit: Payer: Self-pay | Admitting: Internal Medicine

## 2022-01-23 ENCOUNTER — Other Ambulatory Visit (HOSPITAL_COMMUNITY): Payer: Self-pay

## 2022-01-23 MED ORDER — TIRZEPATIDE 12.5 MG/0.5ML ~~LOC~~ SOAJ
12.5000 mg | SUBCUTANEOUS | 3 refills | Status: DC
Start: 2022-01-23 — End: 2022-05-08
  Filled 2022-01-23: qty 2, 28d supply, fill #0
  Filled 2022-02-17: qty 2, 28d supply, fill #1
  Filled 2022-03-19: qty 2, 28d supply, fill #2
  Filled 2022-05-08: qty 2, 28d supply, fill #3

## 2022-01-23 MED ORDER — APIXABAN 2.5 MG PO TABS
2.5000 mg | ORAL_TABLET | Freq: Two times a day (BID) | ORAL | 1 refills | Status: DC
  Filled 2022-01-23 – 2022-01-27 (×3): qty 180, 90d supply, fill #0
  Filled 2022-01-27: qty 60, 30d supply, fill #0

## 2022-01-23 NOTE — Telephone Encounter (Signed)
Sent in today for patient. 

## 2022-01-23 NOTE — Telephone Encounter (Signed)
Patient needs a refill on her mounjaro 12.'5mg'$  and eliquis 2.'5mg'$  and would need it sent to [facility].

## 2022-01-27 ENCOUNTER — Telehealth: Payer: Self-pay | Admitting: Internal Medicine

## 2022-01-27 ENCOUNTER — Other Ambulatory Visit: Payer: Self-pay

## 2022-01-27 ENCOUNTER — Other Ambulatory Visit (HOSPITAL_COMMUNITY): Payer: Self-pay

## 2022-01-27 MED ORDER — APIXABAN 2.5 MG PO TABS
ORAL_TABLET | ORAL | 0 refills | Status: DC
  Filled 2022-01-27: qty 120, 60d supply, fill #0

## 2022-01-27 NOTE — Telephone Encounter (Signed)
Patient called and needs refill on her spironolactone (aldactone) '25mg'$  a 3 month supply, oxcarbazepine(trileptal) '300mg'$  a 3 month supply, and hydroxyzine(atarx) '25mg'$  a 20 day supply. She would like these prescriptions sent to the [facility] on [address].

## 2022-01-29 ENCOUNTER — Other Ambulatory Visit: Payer: Self-pay

## 2022-01-29 MED ORDER — SPIRONOLACTONE 25 MG PO TABS: ORAL_TABLET | ORAL | 0 refills | Status: DC

## 2022-01-29 MED ORDER — HYDROXYZINE HCL 25 MG PO TABS: 25.0000 mg | ORAL_TABLET | Freq: Three times a day (TID) | ORAL | 1 refills | Status: DC | PRN

## 2022-01-29 MED ORDER — OXCARBAZEPINE 300 MG PO TABS: 300.0000 mg | ORAL_TABLET | Freq: Two times a day (BID) | ORAL | 1 refills | Status: DC

## 2022-01-29 NOTE — Telephone Encounter (Signed)
Faxed in today. 

## 2022-01-30 ENCOUNTER — Encounter: Payer: Self-pay | Admitting: Internal Medicine

## 2022-01-30 NOTE — Progress Notes (Signed)
Outside notes received. Information abstracted. Notes sent to scan.  

## 2022-02-15 DIAGNOSIS — J9601 Acute respiratory failure with hypoxia: Secondary | ICD-10-CM | POA: Diagnosis not present

## 2022-02-15 DIAGNOSIS — J9602 Acute respiratory failure with hypercapnia: Secondary | ICD-10-CM | POA: Diagnosis not present

## 2022-02-15 DIAGNOSIS — I5033 Acute on chronic diastolic (congestive) heart failure: Secondary | ICD-10-CM | POA: Diagnosis not present

## 2022-02-17 ENCOUNTER — Telehealth: Payer: Self-pay | Admitting: Internal Medicine

## 2022-02-17 ENCOUNTER — Other Ambulatory Visit (HOSPITAL_COMMUNITY): Payer: Self-pay

## 2022-02-17 NOTE — Telephone Encounter (Signed)
Caller & Relationship to patient: Self  Call back number: [phone]   Date of last office visit: 11.28.23  Date of next office visit: 1.18.24  Medication(s) to be refilled:  tirzepatide Darcel Bayley) 12.5 MG/0.5ML Pen   Preferred Pharmacy:   [facility]   Phone: [phone]  Fax: [phone]

## 2022-02-17 NOTE — Telephone Encounter (Signed)
Called pt and left voicemail stating she already has refills at the pharmacy, she would just need to call the pharmacy for them to fill the mounjaro.

## 2022-02-18 ENCOUNTER — Ambulatory Visit: Payer: Medicare PPO

## 2022-02-19 ENCOUNTER — Ambulatory Visit (INDEPENDENT_AMBULATORY_CARE_PROVIDER_SITE_OTHER): Payer: Medicare PPO | Admitting: *Deleted

## 2022-02-19 ENCOUNTER — Other Ambulatory Visit (HOSPITAL_COMMUNITY): Payer: Self-pay

## 2022-02-19 ENCOUNTER — Encounter: Payer: Self-pay | Admitting: Internal Medicine

## 2022-02-19 DIAGNOSIS — E538 Deficiency of other specified B group vitamins: Secondary | ICD-10-CM | POA: Diagnosis not present

## 2022-02-19 MED ORDER — CYANOCOBALAMIN 1000 MCG/ML IJ SOLN
1000.0000 ug | Freq: Once | INTRAMUSCULAR | Status: AC
  Administered 2022-02-19: 1000 ug via INTRAMUSCULAR

## 2022-02-19 NOTE — Progress Notes (Signed)
Pls cosign for B12 inj../lmb  

## 2022-02-23 ENCOUNTER — Other Ambulatory Visit: Payer: Self-pay

## 2022-02-23 ENCOUNTER — Telehealth: Payer: Self-pay | Admitting: Internal Medicine

## 2022-02-23 MED ORDER — APIXABAN 2.5 MG PO TABS: 2.5000 mg | ORAL_TABLET | Freq: Two times a day (BID) | ORAL | 1 refills | Status: DC

## 2022-02-23 NOTE — Progress Notes (Signed)
Advanced Heart Failure Clinic Note   PCP: [name], MD HF Cardiologist: Dr. Haroldine Laws  Reason for visit: f/u for chronic diastolic heart failure   HPI: April [name] is a 72 y.o. woman with history of morbid obesity Body mass index is 65.23 kg/m., DM2, HTN, HLD, mild-to- moderate CAD, asthma, and chronic dHF.  Cath 2/08: 50-60% LAD lesion and 30% RCA lesion.  EF 70%.  Mild pulmonary HTN primarily due to high output state.  PA mean 27 PCWP 10 PVR 1.8   Admitted 4/22 for HF Echo EF 60-65%. RV normal. She was diuresed and placed back on PO diuretics, switched from lasix 80 bid to torsemide 60 mg d/c. D/c wt 371 lb.   She saw pulmonology for OSA 10/02/20, sleep study ordered, low dose Lunesta added.  10/22 admitted with concerns of CO2 retention and HF Underwent RHC showing mild to moderate pulmonary hypertension, minimally elevated PCWP (18) and normal CO.  Echo 10/22 LVEF 65-70% RV normal RVSP 88mHG  Sleep study 11/22 showed no OSA but + nocturnal desaturations to 84%, ONO ordered.  Acute visit 8/23, volume appeared up. CXR ordered by PCP showed mild pulmonary vascular congestion. Metolazone added weekly to her regimen. Follow up labs showed elevated SCr, metolazone stopped and torsemide reduced to 40 bid.   Saw vascular 10/23 with complaints of waxing and waning cool RLE. ABIs howed moderate R PAD, mild L PAD.  Today she returns for HF follow up with her husband. Says asthma/allergies acting up. Remains SOB with walking. She tries to walk once a day, and back and forth to the bathroom several times a day. Mostly WC-bound. Has HH aide twice a week to help with bathing. Has LE swelling and wears compression wraps. Has dizziness if she stands too quick. Denies palpitations, abnormal bleeding, CP, or PND/Orthopnea. Appetite ok. No fever or chills. Weight at home 325 pounds. Taking all medications.  Has CPAP but uses intermittently. Wears oxygen at home PRN.  Cardiac Studies:  - RHC  (10/22):  RA = 11 RV = 68/12 PA = 69/22 (39) PCW = 18 Fick cardiac output/index = 7.0/2.8 PVR = 3.0 WU Ao sat = 96% PA sat = 61%, 62% SVC sat 69% PAPi = 4.2  - Echo (10/22): EF 65-70%, grade I DD, RV ok  - Echo 4/22: EF 60-65%. RV ok.   ROS: All systems reviewed and negative except as per HPI.   Past Medical History:  Diagnosis Date   Anemia    Asthma    CAD (coronary artery disease)    Carpal tunnel syndrome    Cellulitis of both lower extremities 04/11/2015   CHF (congestive heart failure) (HCC)    Chronic kidney disease    [facility]   Colon polyp, hyperplastic 24235& [id number]  Complication of anesthesia 1999   svt with renal calculi surgery, no problems since   CTS (carpal tunnel syndrome)    bilateral   Diabetes mellitus    Eczema    GERD (gastroesophageal reflux disease)    History of kidney stones 1999   Hyperlipidemia    Hypertension    Leg ulcer ([place] 04/24/2015   Right lateral leg No evidence of an infection Monitor closely Keep edema controlled    Leg ulcer (HCC)    right lower   Meralgia paresthetica    Dr. YKrista Blue  [place]    Neuropathy    toes and legs   Osteoarthrosis, unspecified whether generalized  or localized, lower leg    knee   PUD (peptic ulcer disease)    Shortness of breath dyspnea    with exertion   Sleep apnea    per progress note 02/25/2018   Type II or unspecified type diabetes mellitus without mention of complication, not stated as uncontrolled    Unspecified hereditary and idiopathic peripheral neuropathy    Urticaria    Vitamin B12 deficiency    Wears glasses    Wound cellulitis    right upper leg, healing well   Current Outpatient Medications  Medication Sig Dispense Refill   acetaminophen (TYLENOL) 650 MG CR tablet Take 1,300 mg by mouth every 8 (eight) hours as needed for pain.     Alcohol Swabs (DROPSAFE ALCOHOL PREP) 70 % PADS USE TOPICALLY AS DIRECTED 300 each 3   allopurinol  (ZYLOPRIM) 100 MG tablet TAKE 2 TABLETS(200 MG) BY MOUTH DAILY 180 tablet 1   apixaban (ELIQUIS) 2.5 MG TABS tablet Take 1 tablet (2.5 mg total) by mouth 2 (two) times daily. 180 tablet 1   atorvastatin (LIPITOR) 40 MG tablet TAKE 1 TABLET(40 MG) BY MOUTH AT BEDTIME 90 tablet 2   Blood Glucose Calibration (TRUE METRIX LEVEL 1) Low SOLN UAD  E11.9 1 each 3   Blood Glucose Monitoring Suppl (TRUE METRIX METER) DEVI True Metrix Level 1 solution     Budeson-Glycopyrrol-Formoterol (BREZTRI AEROSPHERE) 160-9-4.8 MCG/ACT AERO Inhale 2 puffs into the lungs in the morning and at bedtime. 10.7 g 0   calcium carbonate (TUMS - DOSED IN MG ELEMENTAL CALCIUM) 500 MG chewable tablet Chew 2 tablets by mouth daily as needed for indigestion or heartburn.     cyanocobalamin (,VITAMIN B-12,) 1000 MCG/ML injection INJECT 1ML INTO MUSCLE EVERY 30 DAYS 10 mL 5   dapagliflozin propanediol (FARXIGA) 10 MG TABS tablet Take 10 mg by mouth daily.     diclofenac Sodium (VOLTAREN) 1 % GEL Apply 1 application topically as needed (pain).     diphenhydrAMINE (BENADRYL) 12.5 MG/5ML liquid Take 25 mg by mouth 4 (four) times daily as needed for allergies.     eszopiclone (LUNESTA) 1 MG TABS tablet Take 1 mg by mouth at bedtime as needed for sleep. Take immediately before bedtime     famotidine (PEPCID) 20 MG tablet TAKE 1 TABLET(20 MG) BY MOUTH TWICE DAILY 180 tablet 3   FEROSUL 325 (65 Fe) MG tablet TAKE 1 TABLET BY MOUTH DAILY ON MONDAY, WEDNESDAY AND FRIDAY 60 tablet 0   fluticasone (FLONASE) 50 MCG/ACT nasal spray Place 2 sprays into both nostrils daily. 18.2 mL 2   gabapentin (NEURONTIN) 100 MG capsule TAKE 1 CAPSULE(100 MG) BY MOUTH THREE TIMES DAILY 90 capsule 1   guaiFENesin-dextromethorphan (ROBITUSSIN DM) 100-10 MG/5ML syrup Take 5 mLs by mouth every 4 (four) hours as needed for cough.     hydrOXYzine (ATARAX) 25 MG tablet Take 1 tablet (25 mg total) by mouth 3 (three) times daily as needed for itching. 90 tablet 1    loratadine (CLARITIN) 10 MG tablet Take 1 tablet (10 mg total) by mouth daily. 30 tablet 5   montelukast (SINGULAIR) 10 MG tablet TAKE 1 TABLET(10 MG) BY MOUTH AT BEDTIME 30 tablet 5   nebivolol (BYSTOLIC) 5 MG tablet TAKE 1 TABLET(5 MG) BY MOUTH DAILY 30 tablet 3   ondansetron (ZOFRAN) 4 MG tablet Take 1 tablet (4 mg total) by mouth every 8 (eight) hours as needed for nausea or vomiting. 20 tablet 0   Oxcarbazepine (TRILEPTAL) 300 MG  tablet Take 1 tablet (300 mg total) by mouth 2 (two) times daily. 180 tablet 1   oxyCODONE (OXY IR/ROXICODONE) 5 MG immediate release tablet Take 1 tablet (5 mg total) by mouth every 6 (six) hours as needed for severe pain (Chronic knee osteoarthritis). 30 tablet 0   polyethylene glycol (MIRALAX / GLYCOLAX) 17 g packet Take 17 g by mouth daily as needed for moderate constipation.     potassium chloride SA (KLOR-CON M) 20 MEQ tablet Take 1 tablet (20 mEq total) by mouth 2 (two) times daily. 180 tablet 3   Spacer/Aero-Holding [facility] Use with inhaler. 1 each 2   spironolactone (ALDACTONE) 25 MG tablet TAKE 1 TABLET(25 MG) BY MOUTH DAILY 90 tablet 0   tirzepatide (MOUNJARO) 12.5 MG/0.5ML Pen Inject 12.5 mg into the skin once a week. 2 mL 3   torsemide (DEMADEX) 20 MG tablet Take 40 mg in AM and 20 mg in PM 360 tablet 3   triamcinolone ointment (KENALOG) 0.5 % Apply 1 Application topically as needed (knee pain).     VENTOLIN HFA 108 (90 Base) MCG/ACT inhaler Inhale 2 puffs into the lungs every 4 (four) hours as needed for wheezing.     No current facility-administered medications for this encounter.   Allergies  Allergen Reactions   Penicillins Rash and Other (See Comments)    She was told not to take it anymore. Has patient had a PCN reaction causing immediate rash, facial/tongue/throat swelling, SOB or lightheadedness with hypotension: Yes Has patient had a PCN reaction causing severe rash involving mucus membranes or skin necrosis: No Has patient had a PCN  reaction that required hospitalization: No Has patient had a PCN reaction occurring within the last 10 years: No If all of the above answers are "NO", then may proceed with Cephalosporin use.'   Shellfish Allergy Anaphylaxis   Sulfonamide Derivatives Anaphylaxis and Other (See Comments)   Hydrochlorothiazide W-Triamterene Other (See Comments)    Hypokalemia   Lotensin [Benazepril Hcl] Hives   Dipyridamole Other (See Comments)    Unknown reaction   Estrogens Other (See Comments)    Unknown reaction   Hydrochlorothiazide Other (See Comments)    Unknown reaction, Hypokalemia?   Latex Other (See Comments)    Unknown reaction - stated previously during surgery gloves had to be changed, but unsure if it is still an allergy or not.    Metronidazole Other (See Comments)    Unknown reaction   Other Itching    PICKLES   Spironolactone Other (See Comments)    UNSPECIFIED > "kidney problems"   Torsemide Other (See Comments)    Unknown reaction   Valsartan Other (See Comments)    Unknown reaction   Mustard [Allyl Isothiocyanate] Itching   Social History   Socioeconomic History   Marital status: Married    Spouse name: April [name]   Number of children: 1   Years of education: BS   Highest education level: Not on file  Occupational History   Occupation: Disabled    Employer: RETIRED  Tobacco Use   Smoking status: Never   Smokeless tobacco: Never  Vaping Use   Vaping Use: Never used  Substance and Sexual Activity   Alcohol use: No    Alcohol/week: 0.0 standard drinks of alcohol   Drug use: No   Sexual activity: Not Currently  Other Topics Concern   Not on file  Social History Narrative   Patient lives at home with her husband April [name]) . Patient is retired and has  a college education B.S.    Caffeine - some times.   Right handed.   Social Determinants of Health   Financial Resource Strain: Low Risk  (09/15/2021)   Overall Financial Resource Strain (CARDIA)    Difficulty of Paying  Living Expenses: Not hard at all  Food Insecurity: No Food Insecurity (09/15/2021)   Hunger Vital Sign    Worried About Running Out of Food in the Last Year: Never true    Ran Out of Food in the Last Year: Never true  Transportation Needs: No Transportation Needs (09/15/2021)   PRAPARE - Hydrologist (Medical): No    Lack of Transportation (Non-Medical): No  Physical Activity: Inactive (09/15/2021)   Exercise Vital Sign    Days of Exercise per Week: 0 days    Minutes of Exercise per Session: 0 min  Stress: No Stress Concern Present (09/15/2021)   [place]    Feeling of Stress : Not at all  Social Connections: Moderately Integrated (09/15/2021)   Social Connection and Isolation Panel [NHANES]    Frequency of Communication with Friends and Family: More than three times a week    Frequency of Social Gatherings with Friends and Family: Never    Attends Religious Services: More than 4 times per year    Active Member of [employer] or Organizations: No    Attends Archivist Meetings: Never    Marital Status: Married  Human resources officer Violence: Not At Risk (09/15/2021)   Humiliation, Afraid, Rape, and Kick questionnaire    Fear of Current or Ex-Partner: No    Emotionally Abused: No    Physically Abused: No    Sexually Abused: No   Family History  Problem Relation Age of Onset   Colon cancer Mother    Prostate cancer Father    Colon cancer Father    Diabetes Maternal Aunt    Breast cancer Maternal Aunt    Diabetes Maternal Uncle    Diabetes Paternal Aunt    Stroke Paternal Aunt        > 56   Heart disease Paternal Aunt    Diabetes Paternal Uncle    Breast cancer Maternal Aunt         X 2   Breast cancer Cousin    BP 104/70   Pulse 85   SpO2 94%   Wt Readings from Last 3 Encounters:  12/30/21 (!) 147.4 kg (325 lb)  11/28/21 (!) 148.3 kg (327 lb)  11/21/21 (!) 148.3 kg (327 lb)    PHYSICAL EXAM: General:  NAD. No resp difficulty, arrived in [facility] HEENT: Normal Neck: Supple. No JVD, thick neck. Carotids 2+ bilat; no bruits. No lymphadenopathy or thryomegaly appreciated. Cor: PMI nondisplaced. Regular rate & rhythm. No rubs, gallops or murmurs. Lungs: Clear Abdomen: Obese, soft, nontender, nondistended. No hepatosplenomegaly. No bruits or masses. Good bowel sounds. Extremities: No cyanosis, clubbing, rash, chronic BLE lymphedema Neuro: Alert & oriented x 3, cranial nerves grossly intact. Moves all 4 extremities w/o difficulty. Affect pleasant.  ECG (personally reviewed):   ASSESSMENT & PLAN: 1. Chronic Diastolic Heart Failure  - RHC 2008 c/w mild pulmonary hypertension, primarily due to high output state.  Her mean pulmonary artery pressure was 27, wedge pressure was 10. PVR was 1.80 [place] units. - Echo (2017): LVEF 65-70%, G1DD, RV normal - Recent admit for a/c dCHF. Volume improved w/ IV Lasix.  - Echo (4/22): LVEF 60-65%, G2DD  -  Echo (10/22): EF 65%, Grade 1 DD, RV normal. - RHC (10/22): w/ mild-mod PH, minimally elevated PCWP (18) - NYHA III, functional status difficult due to body habitus, asthma/chronic resp failure, and physical deconditioning. Volume looks OK on exam. She is unable to stand for a weight. - Continue leg wraps. - Continue torsemide 40/20. Off metolazone with worsening kidney function.  - Continue Farxiga 10 mg daily. No GU symptoms. - Continue spiro 25 mg daily.  - Continue Bystolic 5 mg daily.  - Labs today.  2. Chronic Hypoxic Respiratory Failure - Suspect driven by OHS, split night sleep study showed no OSA. - Continue BiPAP qhs & continue oxygen during the day. - RHC (10/22): c/w mid-mod PH.  - Respiratory status stable. - Continue oxygen.   3. Bilateral LE DVT (Diagnosed 4/22) - Confirmed by venous dopplers. Chest CT negative for PE. - Now on Eliquis. No bleeding issues.   4. CAD:  - LHC in 2008 showed 50-60% LAD lesion and  30% RCA lesion. - No s/s angina. - Continue ? blocker and statin. - No ASA given Eliquis for DVT tx.    5. Stage IIIb CKD - Baseline SCr ~1.5. - Followed by Dr. [name] at [facility]. - BMET today.  6. PAD - ABIs with moderate disease on R - Followed by [facility]  7. Obesity - There is no height or weight on file to calculate BMI. - Now on Mounjaro, down 25+ lbs.   Rafael Bihari, FNP  9:32 AM  Patient seen and examined with the above-signed Advanced Practice Provider and/or Housestaff. I personally reviewed laboratory data, imaging studies and relevant notes. I independently examined the patient and formulated the important aspects of the plan. I have edited the note to reflect any of my changes or salient points. I have personally discussed the plan with the patient and/or family.  Overall stable NYHA III but mobility limited by her weight. Volume status well controlled on torsemide 40/20 + extra as needed. Has lost 25 pounds with Mounjaro.   General:  Obese woman sitting in WC  No resp difficulty HEENT: normal Neck: supple. no JVD. Carotids 2+ bilat; no bruits. No lymphadenopathy or thryomegaly appreciated. Cor: Regular rate & rhythm. No rubs, gallops or murmurs. Lungs: clear Abdomen: obese soft, nontender, nondistended. No hepatosplenomegaly. No bruits or masses. Good bowel sounds. Extremities: no cyanosis, clubbing, rash, mild bilateral lymphedema Neuro: alert & orientedx3, cranial nerves grossly intact. moves all 4 extremities w/o difficulty. Affect pleasant  Overall stable. Volume status looks good. On great meds. Will check echo or labs.   Glori Bickers, MD  9:54 AM

## 2022-02-23 NOTE — Telephone Encounter (Signed)
Faxed in today. 

## 2022-02-23 NOTE — Telephone Encounter (Signed)
Caller & Relationship to patient:  Patient   Call back [phone]   Date of last office visit:   Date of next office visit:  06/23/2022   Medication(s) to be refilled:  Eliquis        Preferred Pharmacy:[facility] on [address],[place]

## 2022-02-24 ENCOUNTER — Encounter (HOSPITAL_COMMUNITY): Payer: Self-pay | Admitting: Internal Medicine

## 2022-02-24 ENCOUNTER — Ambulatory Visit: Payer: Medicare PPO | Admitting: Physician Assistant

## 2022-02-24 ENCOUNTER — Ambulatory Visit (HOSPITAL_COMMUNITY)
Admission: RE | Admit: 2022-02-24 | Discharge: 2022-02-24 | Disposition: A | Discharge status: Home / Self Care | Payer: Medicare PPO | Source: Ambulatory Visit | Attending: Internal Medicine | Admitting: Internal Medicine

## 2022-02-24 ENCOUNTER — Other Ambulatory Visit: Payer: Medicare PPO

## 2022-02-24 VITALS — BP 104/70 | HR 85

## 2022-02-24 DIAGNOSIS — I130 Hypertensive heart and chronic kidney disease with heart failure and stage 1 through stage 4 chronic kidney disease, or unspecified chronic kidney disease: Secondary | ICD-10-CM | POA: Insufficient documentation

## 2022-02-24 DIAGNOSIS — I5032 Chronic diastolic (congestive) heart failure: Secondary | ICD-10-CM | POA: Insufficient documentation

## 2022-02-24 DIAGNOSIS — I2510 Atherosclerotic heart disease of native coronary artery without angina pectoris: Secondary | ICD-10-CM | POA: Insufficient documentation

## 2022-02-24 DIAGNOSIS — J45909 Unspecified asthma, uncomplicated: Secondary | ICD-10-CM | POA: Insufficient documentation

## 2022-02-24 DIAGNOSIS — E785 Hyperlipidemia, unspecified: Secondary | ICD-10-CM | POA: Diagnosis not present

## 2022-02-24 DIAGNOSIS — J9611 Chronic respiratory failure with hypoxia: Secondary | ICD-10-CM | POA: Diagnosis not present

## 2022-02-24 DIAGNOSIS — I272 Pulmonary hypertension, unspecified: Secondary | ICD-10-CM | POA: Insufficient documentation

## 2022-02-24 DIAGNOSIS — Z79899 Other long term (current) drug therapy: Secondary | ICD-10-CM | POA: Insufficient documentation

## 2022-02-24 DIAGNOSIS — N1832 Chronic kidney disease, stage 3b: Secondary | ICD-10-CM | POA: Insufficient documentation

## 2022-02-24 DIAGNOSIS — Z86718 Personal history of other venous thrombosis and embolism: Secondary | ICD-10-CM | POA: Diagnosis not present

## 2022-02-24 DIAGNOSIS — E1122 Type 2 diabetes mellitus with diabetic chronic kidney disease: Secondary | ICD-10-CM | POA: Insufficient documentation

## 2022-02-24 DIAGNOSIS — Z7901 Long term (current) use of anticoagulants: Secondary | ICD-10-CM | POA: Diagnosis not present

## 2022-02-24 DIAGNOSIS — Z6841 Body Mass Index (BMI) 40.0 and over, adult: Secondary | ICD-10-CM | POA: Insufficient documentation

## 2022-02-24 DIAGNOSIS — Z7984 Long term (current) use of oral hypoglycemic drugs: Secondary | ICD-10-CM | POA: Insufficient documentation

## 2022-02-24 LAB — CBC
HCT: 40 % (ref 36.0–46.0)
Hemoglobin: 12.6 g/dL (ref 12.0–15.0)
MCH: 31.3 pg (ref 26.0–34.0)
MCHC: 31.5 g/dL (ref 30.0–36.0)
MCV: 99.3 fL (ref 80.0–100.0)
Platelets: 147 10*3/uL — ABNORMAL LOW (ref 150–400)
RBC: 4.03 MIL/uL (ref 3.87–5.11)
RDW: 15.1 % (ref 11.5–15.5)
WBC: 5 10*3/uL (ref 4.0–10.5)
nRBC: 0 % (ref 0.0–0.2)

## 2022-02-24 LAB — BASIC METABOLIC PANEL
Anion gap: 7 (ref 5–15)
BUN: 15 mg/dL (ref 8–23)
CO2: 33 mmol/L — ABNORMAL HIGH (ref 22–32)
Calcium: 9 mg/dL (ref 8.9–10.3)
Chloride: 98 mmol/L (ref 98–111)
Creatinine, Ser: 1.7 mg/dL — ABNORMAL HIGH (ref 0.44–1.00)
GFR, Estimated: 32 mL/min — ABNORMAL LOW (ref >60)
Glucose, Bld: 90 mg/dL (ref 70–99)
Potassium: 4.6 mmol/L (ref 3.5–5.1)
Sodium: 138 mmol/L (ref 135–145)

## 2022-02-24 NOTE — Patient Instructions (Signed)
There has been no changes to your medications.  Labs done today, your results will be available in MyChart, we will contact you for abnormal readings.  Your physician has requested that you have an echocardiogram. Echocardiography is a painless test that uses sound waves to create images of your heart. It provides your doctor with information about the size and shape of your heart and how well your heart's chambers and valves are working. This procedure takes approximately one hour. There are no restrictions for this procedure. Please do NOT wear cologne, perfume, aftershave, or lotions (deodorant is allowed). Please arrive 15 minutes prior to your appointment time.  Your physician recommends that you schedule a follow-up appointment in: 9 months (October 2024) ** please call the office in July to arrange your follow up appointment. **  If you have any questions or concerns before your next appointment please send [place] a message through [place] or call our office at [phone].    TO LEAVE A MESSAGE FOR THE NURSE SELECT OPTION 2, PLEASE LEAVE A MESSAGE INCLUDING: YOUR NAME DATE OF BIRTH CALL BACK NUMBER REASON FOR CALL**this is important as we prioritize the call backs  YOU WILL RECEIVE A CALL BACK THE SAME DAY AS LONG AS YOU CALL BEFORE 4:00 PM  At the [facility], you and your health needs are our priority. As part of our continuing mission to provide you with exceptional heart care, we have created designated Provider Care Teams. These Care Teams include your primary Cardiologist (physician) and Advanced Practice Providers (APPs- Physician Assistants and Nurse Practitioners) who all work together to provide you with the care you need, when you need it.   You may see any of the following providers on your designated Care Team at your next follow up: Dr Glori Bickers Dr Loralie Champagne Dr. Roxana Hires, NP Lyda Jester, [place] [facility] [place], [place] Forestine Na, NP Audry Riles, PharmD   Please be sure to bring in all your medications bottles to every appointment.

## 2022-02-24 NOTE — Addendum Note (Signed)
Encounter addended by: Jerl Mina, RN on: 02/24/2022 10:00 AM  Actions taken: Order list changed, Diagnosis association updated, Clinical Note Signed, Charge Capture section accepted

## 2022-02-25 ENCOUNTER — Telehealth (HOSPITAL_COMMUNITY): Payer: Self-pay

## 2022-02-25 NOTE — Telephone Encounter (Signed)
April [name] called and asked what her total fluid intake should be each day. She was concerned if she had too much her ankles would swell.  Please advise.

## 2022-02-25 NOTE — Telephone Encounter (Signed)
Informed patient and she understood directions with liquid intake

## 2022-03-09 ENCOUNTER — Telehealth (HOSPITAL_COMMUNITY): Payer: Self-pay | Admitting: *Deleted

## 2022-03-17 ENCOUNTER — Telehealth: Payer: Self-pay | Admitting: Internal Medicine

## 2022-03-17 NOTE — Telephone Encounter (Signed)
Patient would like a recommendation for either a prescription or OTC for collagen to help with her knee. Best callback number is [phone]

## 2022-03-17 NOTE — Telephone Encounter (Signed)
I really can not give a recommendation - there is not enough research to show which from of collagen is best and I can not recommend a specific brand

## 2022-03-18 DIAGNOSIS — J9602 Acute respiratory failure with hypercapnia: Secondary | ICD-10-CM | POA: Diagnosis not present

## 2022-03-18 DIAGNOSIS — J9601 Acute respiratory failure with hypoxia: Secondary | ICD-10-CM | POA: Diagnosis not present

## 2022-03-18 DIAGNOSIS — I5033 Acute on chronic diastolic (congestive) heart failure: Secondary | ICD-10-CM | POA: Diagnosis not present

## 2022-03-18 NOTE — Telephone Encounter (Signed)
Spoke with patient today. 

## 2022-03-19 ENCOUNTER — Other Ambulatory Visit: Payer: Self-pay

## 2022-03-20 ENCOUNTER — Ambulatory Visit: Payer: Medicare PPO | Admitting: Physician Assistant

## 2022-03-20 ENCOUNTER — Other Ambulatory Visit (HOSPITAL_COMMUNITY): Payer: Self-pay

## 2022-03-20 ENCOUNTER — Other Ambulatory Visit: Payer: Medicare PPO

## 2022-03-20 ENCOUNTER — Ambulatory Visit (INDEPENDENT_AMBULATORY_CARE_PROVIDER_SITE_OTHER): Payer: Medicare PPO

## 2022-03-20 DIAGNOSIS — E538 Deficiency of other specified B group vitamins: Secondary | ICD-10-CM | POA: Diagnosis not present

## 2022-03-20 MED ORDER — CYANOCOBALAMIN 1000 MCG/ML IJ SOLN
1000.0000 ug | Freq: Once | INTRAMUSCULAR | Status: AC
  Administered 2022-03-20: 1000 ug via INTRAMUSCULAR

## 2022-03-20 NOTE — Progress Notes (Signed)
Pt here for monthly B12 injection per Dr burns   B12 1074mg given IM, and pt tolerated injection well.

## 2022-03-23 ENCOUNTER — Telehealth: Payer: Self-pay | Admitting: Internal Medicine

## 2022-03-23 NOTE — Telephone Encounter (Signed)
She will either have to go up on the dose or consider going down on the dose or find a different pharmacy.  It is not simple to just switch to another medication since she will have to start the lower dose and those medications are not readily available either.

## 2022-03-23 NOTE — Telephone Encounter (Signed)
Pt called stating that tirzepatide [facility]) 12.5 MG/0.5ML out of stock up which she is unable to take the medication. Pt requesting to find a substitution with another medication.

## 2022-03-24 ENCOUNTER — Other Ambulatory Visit: Payer: Self-pay

## 2022-03-24 ENCOUNTER — Other Ambulatory Visit (HOSPITAL_COMMUNITY): Payer: Self-pay

## 2022-03-24 MED ORDER — TIRZEPATIDE 15 MG/0.5ML ~~LOC~~ SOAJ: 15.0000 mg | SUBCUTANEOUS | 0 refills | Status: DC

## 2022-03-24 NOTE — Telephone Encounter (Signed)
Spoke with patient today.  She will call around to see if she is able to find either the 10 mg or 15 mg in-stock and call me back.

## 2022-03-24 NOTE — Telephone Encounter (Signed)
Spoke with patient and 15 mg sent to [facility] on [address].

## 2022-03-24 NOTE — Telephone Encounter (Signed)
Pt states 15Mg available at the [facility] on [address] and [address] on [address] has the 10 MG.

## 2022-03-27 ENCOUNTER — Other Ambulatory Visit: Payer: Self-pay | Admitting: Internal Medicine

## 2022-03-27 DIAGNOSIS — Z1231 Encounter for screening mammogram for malignant neoplasm of breast: Secondary | ICD-10-CM

## 2022-03-30 ENCOUNTER — Other Ambulatory Visit (HOSPITAL_COMMUNITY): Payer: Self-pay

## 2022-03-30 ENCOUNTER — Other Ambulatory Visit: Payer: Self-pay | Admitting: Internal Medicine

## 2022-04-03 ENCOUNTER — Ambulatory Visit (HOSPITAL_COMMUNITY)
Admission: RE | Admit: 2022-04-03 | Discharge: 2022-04-03 | Disposition: A | Discharge status: Home / Self Care | Payer: Medicare PPO | Source: Ambulatory Visit | Attending: Internal Medicine | Admitting: Internal Medicine

## 2022-04-03 DIAGNOSIS — N189 Chronic kidney disease, unspecified: Secondary | ICD-10-CM | POA: Diagnosis not present

## 2022-04-03 DIAGNOSIS — I5032 Chronic diastolic (congestive) heart failure: Secondary | ICD-10-CM

## 2022-04-03 DIAGNOSIS — I130 Hypertensive heart and chronic kidney disease with heart failure and stage 1 through stage 4 chronic kidney disease, or unspecified chronic kidney disease: Secondary | ICD-10-CM | POA: Diagnosis not present

## 2022-04-03 DIAGNOSIS — E669 Obesity, unspecified: Secondary | ICD-10-CM | POA: Diagnosis not present

## 2022-04-03 DIAGNOSIS — I2510 Atherosclerotic heart disease of native coronary artery without angina pectoris: Secondary | ICD-10-CM | POA: Diagnosis not present

## 2022-04-03 DIAGNOSIS — E1122 Type 2 diabetes mellitus with diabetic chronic kidney disease: Secondary | ICD-10-CM | POA: Insufficient documentation

## 2022-04-03 LAB — ECHOCARDIOGRAM COMPLETE: S' Lateral: 2.2 cm

## 2022-04-03 NOTE — Progress Notes (Signed)
  Echocardiogram 2D Echocardiogram has been performed.  April [name] 04/03/2022, 11:51 AM

## 2022-04-14 ENCOUNTER — Telehealth: Payer: Self-pay | Admitting: Internal Medicine

## 2022-04-14 NOTE — Telephone Encounter (Signed)
Per 3/11 IB reached out to patient to schedule, patient aware of date and time of appointment.

## 2022-04-15 ENCOUNTER — Telehealth (HOSPITAL_COMMUNITY): Payer: Self-pay

## 2022-04-15 ENCOUNTER — Other Ambulatory Visit: Payer: Self-pay | Admitting: Internal Medicine

## 2022-04-15 ENCOUNTER — Other Ambulatory Visit (HOSPITAL_COMMUNITY): Payer: Self-pay

## 2022-04-15 NOTE — Telephone Encounter (Signed)
Patient is calling about results of Echo from march 1st. Can you look into?

## 2022-04-16 DIAGNOSIS — J9601 Acute respiratory failure with hypoxia: Secondary | ICD-10-CM | POA: Diagnosis not present

## 2022-04-16 DIAGNOSIS — I5033 Acute on chronic diastolic (congestive) heart failure: Secondary | ICD-10-CM | POA: Diagnosis not present

## 2022-04-16 DIAGNOSIS — J9602 Acute respiratory failure with hypercapnia: Secondary | ICD-10-CM | POA: Diagnosis not present

## 2022-04-16 NOTE — Telephone Encounter (Signed)
Spoke to patient and updated her on status.

## 2022-04-17 ENCOUNTER — Ambulatory Visit: Payer: Medicare PPO

## 2022-04-17 ENCOUNTER — Ambulatory Visit (INDEPENDENT_AMBULATORY_CARE_PROVIDER_SITE_OTHER): Payer: Medicare PPO

## 2022-04-17 ENCOUNTER — Other Ambulatory Visit: Payer: Medicare PPO

## 2022-04-17 ENCOUNTER — Ambulatory Visit: Payer: Medicare PPO | Admitting: Physician Assistant

## 2022-04-17 ENCOUNTER — Ambulatory Visit (INDEPENDENT_AMBULATORY_CARE_PROVIDER_SITE_OTHER): Payer: Medicare PPO | Admitting: Podiatry

## 2022-04-17 DIAGNOSIS — M79675 Pain in left toe(s): Secondary | ICD-10-CM

## 2022-04-17 DIAGNOSIS — B351 Tinea unguium: Secondary | ICD-10-CM | POA: Diagnosis not present

## 2022-04-17 DIAGNOSIS — Z7901 Long term (current) use of anticoagulants: Secondary | ICD-10-CM

## 2022-04-17 DIAGNOSIS — E1142 Type 2 diabetes mellitus with diabetic polyneuropathy: Secondary | ICD-10-CM | POA: Diagnosis not present

## 2022-04-17 DIAGNOSIS — M79674 Pain in right toe(s): Secondary | ICD-10-CM | POA: Diagnosis not present

## 2022-04-17 DIAGNOSIS — E538 Deficiency of other specified B group vitamins: Secondary | ICD-10-CM

## 2022-04-17 MED ORDER — CYANOCOBALAMIN 1000 MCG/ML IJ SOLN
1000.0000 ug | Freq: Once | INTRAMUSCULAR | Status: AC
  Administered 2022-04-17: 1000 ug via INTRAMUSCULAR

## 2022-04-17 NOTE — Progress Notes (Signed)
Pt was given B12 injection w/o any complications. 

## 2022-04-20 NOTE — Progress Notes (Signed)
Subjective: Chief Complaint  Patient presents with   diabetic foot care     72 y.o. returns the office today for painful, elongated, thickened toenails which she cannot trim herself. Denies any redness or drainage around the nails.  No new pedal complaints today.  On Eliquis   PCP: [name], MD Last Seen: 12/30/2021  A1c: 5.9 on 28th May 23  Objective: AAO 3, NAD- in wheelchair DP/PT pulses decreased- feet are warm and perfused.  Chronic lower extremity edema present.  Sensation decreased as well as to monofilament. Nails hypertrophic, dystrophic, elongated, brittle, discolored 10. There is tenderness overlying the nails 1-5 bilaterally. There is no surrounding erythema or drainage along the nail sites. No open lesions or pre-ulcerative lesions are identified. No pain with calf compression, swelling, warmth, erythema.  Assessment: Patient presents with symptomatic onychomycosis  Plan: -Treatment options including alternatives, risks, complications were discussed -Nails sharply debrided 10 without complication/bleeding. -Discussed daily foot inspection. If there are any changes, to call the office immediately.  -Follow-up in 3 months or sooner if any problems are to arise. In the meantime, encouraged to call the office with any questions, concerns, changes symptoms.  Celesta Gentile, DPM

## 2022-04-27 ENCOUNTER — Telehealth: Payer: Self-pay | Admitting: Internal Medicine

## 2022-04-27 ENCOUNTER — Other Ambulatory Visit: Payer: Self-pay

## 2022-04-27 ENCOUNTER — Other Ambulatory Visit: Payer: Self-pay | Admitting: Internal Medicine

## 2022-04-27 ENCOUNTER — Other Ambulatory Visit (HOSPITAL_COMMUNITY): Payer: Self-pay

## 2022-04-27 MED ORDER — MOUNJARO 15 MG/0.5ML ~~LOC~~ SOAJ: SUBCUTANEOUS | 0 refills | Status: DC

## 2022-04-27 MED ORDER — MOUNJARO 15 MG/0.5ML ~~LOC~~ SOAJ
SUBCUTANEOUS | 0 refills | Status: DC
  Filled 2022-04-27: qty 2, 28d supply, fill #0

## 2022-04-27 NOTE — Telephone Encounter (Signed)
Prescription Request  04/27/2022  LOV: 12/30/2021  What is the name of the medication or equipment?  MOUNJARO 15 MG/0.5ML Pen  Have you contacted your pharmacy to request a refill? Yes   Which pharmacy would you like this sent to?   [facility] [place] [address] Phone: [phone] Fax: [phone]    Patient notified that their request is being sent to the clinical staff for review and that they should receive a response within 2 business days.   Please advise at Mobile [phone] (mobile)

## 2022-04-27 NOTE — Telephone Encounter (Signed)
Sent in today 

## 2022-04-28 ENCOUNTER — Telehealth (HOSPITAL_COMMUNITY): Payer: Self-pay | Admitting: Cardiology

## 2022-04-28 ENCOUNTER — Other Ambulatory Visit (HOSPITAL_COMMUNITY): Payer: Self-pay

## 2022-04-28 NOTE — Telephone Encounter (Signed)
Pt called for echo results Advised she was not given the results as the report was normal

## 2022-05-01 ENCOUNTER — Other Ambulatory Visit (HOSPITAL_COMMUNITY): Payer: Self-pay

## 2022-05-04 ENCOUNTER — Other Ambulatory Visit: Payer: Self-pay | Admitting: *Deleted

## 2022-05-04 ENCOUNTER — Ambulatory Visit: Payer: Medicare PPO | Admitting: Nurse Practitioner

## 2022-05-04 ENCOUNTER — Encounter: Payer: Self-pay | Admitting: Nurse Practitioner

## 2022-05-04 VITALS — BP 132/80 | HR 91 | Ht 64.0 in | Wt 330.2 lb

## 2022-05-04 DIAGNOSIS — J9612 Chronic respiratory failure with hypercapnia: Secondary | ICD-10-CM

## 2022-05-04 DIAGNOSIS — J301 Allergic rhinitis due to pollen: Secondary | ICD-10-CM | POA: Diagnosis not present

## 2022-05-04 DIAGNOSIS — L03115 Cellulitis of right lower limb: Secondary | ICD-10-CM

## 2022-05-04 DIAGNOSIS — J4541 Moderate persistent asthma with (acute) exacerbation: Secondary | ICD-10-CM | POA: Diagnosis not present

## 2022-05-04 DIAGNOSIS — J309 Allergic rhinitis, unspecified: Secondary | ICD-10-CM

## 2022-05-04 DIAGNOSIS — L0390 Cellulitis, unspecified: Secondary | ICD-10-CM | POA: Insufficient documentation

## 2022-05-04 DIAGNOSIS — J9611 Chronic respiratory failure with hypoxia: Secondary | ICD-10-CM | POA: Diagnosis not present

## 2022-05-04 DIAGNOSIS — I5032 Chronic diastolic (congestive) heart failure: Secondary | ICD-10-CM

## 2022-05-04 DIAGNOSIS — J45909 Unspecified asthma, uncomplicated: Secondary | ICD-10-CM

## 2022-05-04 MED ORDER — DOXYCYCLINE HYCLATE 100 MG PO TABS: 100.0000 mg | ORAL_TABLET | Freq: Two times a day (BID) | ORAL | 0 refills | Status: DC

## 2022-05-04 MED ORDER — PREDNISONE 20 MG PO TABS: 40.0000 mg | ORAL_TABLET | Freq: Every day | ORAL | 0 refills | Status: AC

## 2022-05-04 MED ORDER — LORATADINE 10 MG PO TABS: 10.0000 mg | ORAL_TABLET | Freq: Every day | ORAL | 5 refills | Status: DC

## 2022-05-04 MED ORDER — BREZTRI AEROSPHERE 160-9-4.8 MCG/ACT IN AERO: 2.0000 | INHALATION_SPRAY | Freq: Two times a day (BID) | RESPIRATORY_TRACT | 5 refills | Status: DC

## 2022-05-04 NOTE — Progress Notes (Signed)
@Patient  ID: April [name], female    DOB: 10/06/1950, 72 y.o.   MRN: [id number]  Chief Complaint  Patient presents with   Follow-up    Pt f/u for asthma she believes that the weather is causing her to have a flare going on 2 weeks.     Referring provider: [name], MD  HPI: 72 year old female, never smoker followed for nocturnal hypoxemia, pulmonary hypertension, obesity hypoventilation syndrome and asthma.  She is a patient of Dr. Judson Roch and last seen in office 11/21/2021.  Past medical history significant for CHF, former DVT on chronic anticoagulation, hypertension, CAD, GERD, PCP, DM2, morbid obesity, CKD stage III, HLD  TEST/EVENTS:  05/07/2020 CTA chest: Mild cardiomegaly suggestive of PA hypertension.  Atherosclerosis.  No lymphadenopathy.  Minimal groundglass opacities in the lung bases.  There is slight suboptimal opacification of main PA without central or proximal segmental PE.  Small hiatal hernia 11/18/2020 echocardiogram: EF 65 to 70%.  LVH.  G1 DD.  The interventricular septum is flattened consistent with RV pressure and volume overload 11/19/2020 right heart cath: Mild to moderate pulmonary hypertension there is minimally elevated PC WP.  Normal CO 12/09/2020 split-night study: Negative for OSA.  Nocturnal desaturations to 84% requiring 1 L/min O2.  Recommended continued use of BiPAP for CO2 blow off and 1 L supplemental O2 at night 04/04/2021 CXR 2 view: Frontal and lateral views of the chest were unremarkable.  There was no acute airspace disease noted. 08/29/2021 CXR: vascular congestion. Lower lung interstitial opacities suggestive of edema   10/02/2020: OV with Dr. Ander Slade. Scheduled in lab split-night. Lunesta low dose for sleep. Weight loss advised but limits with severe arthritis. Encouraged to decrease naps and consolidate sleep at night. F/u in 3-4 months.    11/15/2020 - 11/23/2018: Hospital admission for acute on chronic CHF exacerbation and acute on chronic  hypoxic hypercapnic respiratory failure.  pH 7.29 PCO2 of 80 and PO2 of 60. IV diuresis with Lasix.  Right heart cath on 10/19.  PCCM consulted with recommendations for BiPAP versus NIPPV upon discharge.   01/03/2021: OV with Tyronn Golda NP for follow up after sleep study and overdue hospital follow up. Breathing stable. Continued on BiPAP and supplemental oxygen at night 1 lpm. Continued on Symbicort for maintenance. PRN Ventolin. Singulair at bedtime.   04/04/2021: OV with Asbury Hair NP for follow-up.  Felt like she had been having to use her oxygen more during the day and not just at night.  Progressive DOE.  Consistently wearing 2 L/min with activity; reports she has been doing this for the past few months.  Started after we saw her last.  Continues to wear BiPAP at night along with 1 L/min supplemental O2.  She was concerned that her asthma was flaring as she had not had any increased cough over the past week.  Suspected that DOE was multifactorial related to CHF, obesity, deconditioning and slight asthma component.  She was unable to complete FeNO however there was no significant bronchospasm on exam.  Trialed step up in therapy to [facility].  Continued Singulair at night.  Added Claritin for trigger prevention.  Flonase for postnasal drainage control.  And Delsym and Tessalon Perles for cough control.  Suspected that there was an upper airway component to her cough.  Walking oximetry with desaturations to 84% on room air.  She was placed on 3 L/min POC and maintain saturations in the 90s. CXR and labs unremarkable.   05/16/2021: OV with Daira Hine NP for follow-up.  Overall her breathing has been relatively stable and at baseline.  Feels like she did notice a improvement with the Breztri.  She ran out of samples and went back to her Symbicort.  Rx sent for [employer]. Cough is overall stable as well.  Relatively infrequent and usually dry.  Her biggest complaint was that she fell at the end of March and has had generalized pain  since.  She does have a place in her groin where she landed on her cane that is giving her the most trouble.  She [facility] visit today to discuss this.  She denies any lightheaded or dizziness.  No loss of consciousness with fall.  Allergies seem relatively well controlled.  Does still have occasional postnasal drip but improved with her changes last time.  She continues on BiPAP nightly.  She has yet to receive her POC from her DME company.  She has been using her oxygen concentrator at home.  She is on room air currently in office with oxygen saturations in the 90s.  She is usually able to maintain saturations at rest but drops to the 80s with activity - qualified for POC 3 lpm at OV so orders sent to DME.    08/15/2021: OV with Qusai Kem NP for follow up with her husband. She reports that her breathing has been stable. She is feeling well on the [place]. She still occasionally has some wheezing but usually clears on it's own. She rarely has to use her albuterol. She gets winded with long distances and climbing, and is normally relatively sedentary; this is unchanged from her baseline and some is related to her chronic knee pain. She is actively trying to lose weight and is down about 20 pounds. Allergies are well controlled. She wears her BiPAP nightly. She finally got her POC machine this morning and has herself set to 2 lpm which she is 98% on. Rare use of pulmicort or albuterol inhalers. No concerns or complaints today.   11/21/2021: OV with Dr. Ander Slade. Continues to do well. Some weight loss. Denies any significant symptoms at present. She continues to use [employer]. She is using BiPAP nightly for hypercapnic respiratory failure. Oxygen still drops with activity.   05/04/2022: Today - follow up Patient presents today for follow up with her husband. She was doing pretty well up until the last few weeks. She's been having some more trouble with her breathing and chest  tightness. She's also been feeling like her sinuses are congested, having some occasional headaches, and dealing with a cough that is minimally productive at times with yellow phlegm. She is having similar colored nasal drainage. She thinks it is all related to her allergies. She has noticed an occasional wheeze. She is also worried about a tender and more red spot on her right lower leg. She first noticed this around a week ago. Denies any drainage, fevers, chills, increased leg swelling. No weight gain, orthopnea, PND. No known sick exposures. She has not missed any doses of her Eliquis. Eating and drinking well. She is using her Breztri twice a day, taking singulair and using flonase. She has not been on Claritin as she ran out. She has not been using her rescue inhalers but she has been using her oxygen more. She admits to decreased use of BiPAP as she prefers to wear her oxygen at night.   Allergies  Allergen Reactions   Penicillins Rash and Other (See Comments)  She was told not to take it anymore. Has patient had a PCN reaction causing immediate rash, facial/tongue/throat swelling, SOB or lightheadedness with hypotension: Yes Has patient had a PCN reaction causing severe rash involving mucus membranes or skin necrosis: No Has patient had a PCN reaction that required hospitalization: No Has patient had a PCN reaction occurring within the last 10 years: No If all of the above answers are "NO", then may proceed with Cephalosporin use.'   Shellfish Allergy Anaphylaxis   Sulfonamide Derivatives Anaphylaxis and Other (See Comments)   Hydrochlorothiazide W-Triamterene Other (See Comments)    Hypokalemia   Lotensin [Benazepril Hcl] Hives   Dipyridamole Other (See Comments)    Unknown reaction   Estrogens Other (See Comments)    Unknown reaction   Hydrochlorothiazide Other (See Comments)    Unknown reaction, Hypokalemia?   Latex Other (See Comments)    Unknown reaction - stated previously  during surgery gloves had to be changed, but unsure if it is still an allergy or not.    Metronidazole Other (See Comments)    Unknown reaction   Other Itching    PICKLES   Spironolactone Other (See Comments)    UNSPECIFIED > "kidney problems"   Torsemide Other (See Comments)    Unknown reaction   Valsartan Other (See Comments)    Unknown reaction   Mustard [Allyl Isothiocyanate] Itching    Immunization History  Administered Date(s) Administered   Fluad Quad(high Dose 65+) 10/28/2018, 10/18/2020, 11/07/2021   Influenza Split 12/03/2010, 11/03/2011   Influenza Whole 11/16/2006, 11/22/2007, 10/31/2008, 10/29/2009   Influenza, High Dose Seasonal PF 10/10/2015, 11/06/2016, 11/16/2017, 11/08/2019   Influenza,inj,Quad PF,6+ Mos 10/19/2012, 10/25/2013, 10/29/2014   Influenza-Unspecified 10/23/2016, 11/18/2016   Moderna Sars-Covid-2 Vaccination 03/13/2019, 04/10/2019, 12/06/2019   PFIZER Comirnaty(Gray Top)Covid-19 Tri-Sucrose Vaccine 11/14/2021   Pfizer Covid-19 Vaccine Bivalent Booster 33yrs & up 11/08/2020   Pneumococcal Conjugate-13 06/07/2015   Pneumococcal Polysaccharide-23 03/24/2005, 01/04/2012, 06/29/2017   Td 06/24/2009   Tdap 10/09/2014   Zoster Recombinat (Shingrix) 08/11/2016, 10/23/2016   Zoster, Live 01/15/2012    Past Medical History:  Diagnosis Date   Anemia    Asthma    CAD (coronary artery disease)    Carpal tunnel syndrome    Cellulitis of both lower extremities 04/11/2015   CHF (congestive heart failure)    Chronic kidney disease    CKI- followed by [facility]   Colon polyp, hyperplastic [id number]   Complication of anesthesia 1999   svt with renal calculi surgery, no problems since   CTS (carpal tunnel syndrome)    bilateral   Diabetes mellitus    Eczema    GERD (gastroesophageal reflux disease)    History of kidney stones 1999   Hyperlipidemia    Hypertension    Leg ulcer 04/24/2015   Right lateral leg No evidence of an infection Monitor  closely Keep edema controlled    Leg ulcer    right lower   Meralgia paresthetica    Dr. [name]   Morbid obesity    Neuropathy    toes and legs   Osteoarthrosis, unspecified whether generalized or localized, lower leg    knee   PUD (peptic ulcer disease)    Shortness of breath dyspnea    with exertion   Sleep apnea    per progress note 02/25/2018   Type II or unspecified type diabetes mellitus without mention of complication, not stated as uncontrolled    Unspecified hereditary and idiopathic peripheral neuropathy  Urticaria    Vitamin B12 deficiency    Wears glasses    Wound cellulitis    right upper leg, healing well    Tobacco History: Social History   Tobacco Use  Smoking Status Never  Smokeless Tobacco Never   Counseling given: Not Answered   Outpatient Medications Prior to Visit  Medication Sig Dispense Refill   acetaminophen (TYLENOL) 650 MG CR tablet Take 1,300 mg by mouth every 8 (eight) hours as needed for pain.     Alcohol Swabs (DROPSAFE ALCOHOL PREP) 70 % PADS USE TOPICALLY AS DIRECTED 300 each 3   allopurinol (ZYLOPRIM) 100 MG tablet TAKE 2 TABLETS(200 MG) BY MOUTH DAILY 180 tablet 1   apixaban (ELIQUIS) 2.5 MG TABS tablet Take 1 tablet (2.5 mg total) by mouth 2 (two) times daily. 180 tablet 1   atorvastatin (LIPITOR) 40 MG tablet TAKE 1 TABLET(40 MG) BY MOUTH AT BEDTIME 90 tablet 2   Blood Glucose Calibration (TRUE METRIX LEVEL 1) Low SOLN UAD  E11.9 1 each 3   Blood Glucose Monitoring Suppl (TRUE METRIX METER) DEVI True Metrix Level 1 solution     Budeson-Glycopyrrol-Formoterol (BREZTRI AEROSPHERE) 160-9-4.8 MCG/ACT AERO Inhale 2 puffs into the lungs in the morning and at bedtime. 10.7 g 5   calcium carbonate (TUMS - DOSED IN MG ELEMENTAL CALCIUM) 500 MG chewable tablet Chew 2 tablets by mouth daily as needed for indigestion or heartburn.     cyanocobalamin (,VITAMIN B-12,) 1000 MCG/ML injection INJECT 1ML INTO MUSCLE EVERY 30 DAYS 10 mL 5   dapagliflozin  propanediol (FARXIGA) 10 MG TABS tablet Take 10 mg by mouth daily.     diclofenac Sodium (VOLTAREN) 1 % GEL Apply 1 application topically as needed (pain).     diphenhydrAMINE (BENADRYL) 12.5 MG/5ML liquid Take 25 mg by mouth 4 (four) times daily as needed for allergies.     eszopiclone (LUNESTA) 1 MG TABS tablet Take 1 mg by mouth at bedtime as needed for sleep. Take immediately before bedtime     famotidine (PEPCID) 20 MG tablet TAKE 1 TABLET(20 MG) BY MOUTH TWICE DAILY 180 tablet 3   FEROSUL 325 (65 Fe) MG tablet TAKE 1 TABLET BY MOUTH DAILY ON MONDAY, WEDNESDAY AND FRIDAY 60 tablet 0   fluticasone (FLONASE) 50 MCG/ACT nasal spray Place 2 sprays into both nostrils daily. 18.2 mL 2   gabapentin (NEURONTIN) 100 MG capsule TAKE 1 CAPSULE(100 MG) BY MOUTH THREE TIMES DAILY 90 capsule 1   guaiFENesin-dextromethorphan (ROBITUSSIN DM) 100-10 MG/5ML syrup Take 5 mLs by mouth every 4 (four) hours as needed for cough.     hydrOXYzine (ATARAX) 25 MG tablet Take 1 tablet (25 mg total) by mouth 3 (three) times daily as needed for itching. 90 tablet 1   montelukast (SINGULAIR) 10 MG tablet TAKE 1 TABLET(10 MG) BY MOUTH AT BEDTIME 30 tablet 5   nebivolol (BYSTOLIC) 5 MG tablet TAKE 1 TABLET(5 MG) BY MOUTH DAILY 30 tablet 3   ondansetron (ZOFRAN) 4 MG tablet Take 1 tablet (4 mg total) by mouth every 8 (eight) hours as needed for nausea or vomiting. 20 tablet 0   Oxcarbazepine (TRILEPTAL) 300 MG tablet Take 1 tablet (300 mg total) by mouth 2 (two) times daily. 180 tablet 1   oxyCODONE (OXY IR/ROXICODONE) 5 MG immediate release tablet Take 1 tablet (5 mg total) by mouth every 6 (six) hours as needed for severe pain (Chronic knee osteoarthritis). 30 tablet 0   polyethylene glycol (MIRALAX / GLYCOLAX) 17 g packet  Take 17 g by mouth daily as needed for moderate constipation.     potassium chloride SA (KLOR-CON M) 20 MEQ tablet Take 1 tablet (20 mEq total) by mouth 2 (two) times daily. 180 tablet 3    Spacer/Aero-Holding [facility] Use with inhaler. 1 each 2   spironolactone (ALDACTONE) 25 MG tablet TAKE 1 TABLET(25 MG) BY MOUTH DAILY 90 tablet 0   tirzepatide (MOUNJARO) 12.5 MG/0.5ML Pen Inject 12.5 mg into the skin once a week. 2 mL 3   tirzepatide (MOUNJARO) 15 MG/0.5ML Pen INJECT 1/2 (ONE-HALF) ML  (15MG ) ONCE A WEEK 4 mL 0   torsemide (DEMADEX) 20 MG tablet Take 40 mg in AM and 20 mg in PM 360 tablet 3   triamcinolone ointment (KENALOG) 0.5 % Apply 1 Application topically as needed (knee pain).     VENTOLIN HFA 108 (90 Base) MCG/ACT inhaler Inhale 2 puffs into the lungs every 4 (four) hours as needed for wheezing.     loratadine (CLARITIN) 10 MG tablet Take 1 tablet (10 mg total) by mouth daily. 30 tablet 5   No facility-administered medications prior to visit.     Review of Systems:   Constitutional: No weight loss or gain, night sweats, fevers, chills, +mild fatigue (chronic) HEENT: No difficulty swallowing, tooth/dental problems, or sore throat. No sneezing, itching, ear ache, +nasal congestion/drainage, sinus headaches CV:  No chest pain, orthopnea, PND, swelling in lower extremities, anasarca, dizziness, palpitations, syncope Resp: +shortness of breath with exertion; minimally productive cough; occasional wheeze. No hemoptysis.  No chest wall deformity GI:  No heartburn, indigestion, abdominal pain, nausea, vomiting, diarrhea, change in bowel habits, loss of appetite, bloody stools.  GU: No dysuria, change in color of urine, urgency or frequency.   Skin: No rash. +red, tender area RLE MSK:  +knee pain (chronic). No joint swelling.  No decreased range of motion.  No back pain. Neuro: No dizziness or lightheadedness.  Psych: No depression or anxiety. Mood stable.     Physical Exam:  BP 132/80   Pulse 91   Ht 5\' 4"  (1.626 m)   Wt (!) 330 lb 3.2 oz (149.8 kg)   SpO2 93%   BMI 56.68 kg/m   GEN: Pleasant, interactive, chronically-ill appearing; morbidly obese; in no  acute distress. HEENT:  Normocephalic and atraumatic. PERRLA. Sclera white. Nasal turbinates boggy, moist and patent bilaterally. White rhinorrhea present. Oropharynx pink and moist, without exudate or edema. No lesions, ulcerations NECK:  Supple w/ fair ROM. No JVD present.  CV: RRR, no m/r/g, no peripheral edema. Pulses intact, +2 bilaterally. No cyanosis, pallor or clubbing. PULMONARY:  Unlabored, regular breathing. End expiratory wheezes bilaterally A&P. No accessory muscle use. No dullness to percussion. GI: BS present and normoactive. Soft, non-tender to palpation.  MSK: No erythema, warmth or tenderness to left knee. Cap refil <2 sec all extrem. No deformities or joint swelling noted.  Neuro: A/Ox3. No focal deficits noted.   Skin: No rashes. Erythematous approx 5 cm x 3 cm area to lateral/posterior right lower leg, no exudate or swelling. Psych: Normal affect and behavior. Judgement and thought content appropriate.     Lab Results:  CBC    Component Value Date/Time   WBC 5.0 02/24/2022 1002   RBC 4.03 02/24/2022 1002   HGB 12.6 02/24/2022 1002   HGB 11.3 (L) 06/13/2021 1339   HCT 40.0 02/24/2022 1002   PLT 147 (L) 02/24/2022 1002   PLT 206 06/13/2021 1339   MCV 99.3 02/24/2022 1002  MCH 31.3 02/24/2022 1002   MCHC 31.5 02/24/2022 1002   RDW 15.1 02/24/2022 1002   LYMPHSABS 1.3 12/30/2021 1056   MONOABS 0.4 12/30/2021 1056   EOSABS 0.1 12/30/2021 1056   BASOSABS 0.1 12/30/2021 1056    BMET    Component Value Date/Time   NA 138 02/24/2022 1002   NA 145 (H) 10/25/2012 1117   K 4.6 02/24/2022 1002   CL 98 02/24/2022 1002   CO2 33 (H) 02/24/2022 1002   GLUCOSE 90 02/24/2022 1002   BUN 15 02/24/2022 1002   BUN 24 10/25/2012 1117   CREATININE 1.70 (H) 02/24/2022 1002   CREATININE 1.50 (H) 06/13/2021 1339   CALCIUM 9.0 02/24/2022 1002   GFRNONAA 32 (L) 02/24/2022 1002   GFRNONAA 37 (L) 06/13/2021 1339   GFRAA 44 (L) 02/28/2019 0252    BNP    Component Value  Date/Time   BNP 34.2 09/03/2021 1026     Imaging:  No results found.  cyanocobalamin (VITAMIN B12) injection 1,000 mcg     Date Action Dose Route User   03/20/2022 [id number] Given 1,000 mcg Intramuscular (Left Deltoid) Macie Burows, CMA      cyanocobalamin (VITAMIN B12) injection 1,000 mcg     Date Action Dose Route User   04/17/2022 1024 Given 1,000 mcg Intramuscular (Left Deltoid) Scruggs-Hernandez, Bubba Hales, CMA           No data to display          No results found for: "NITRICOXIDE"      Assessment & Plan:   Intrinsic asthma Asthma exacerbation with bronchitis secondary to allergies. Non-toxic appearing and VS stable on room air in office. We will treat her with steroid burst and empiric course of doxycycline. She will restart daily antihistamine for trigger prevention. Action plan in place.  Patient Instructions  -Continue on BiPAP auto 5-20, pressure support of 4 with backup rate of 10 for CO2 blow off and to reduce your risk of rehospitalization  Use for a minimum 6 hours a night, with goal of using entire night.  Change equipment every 30 days or as directed by DME.  Be aware of reduced alertness and do not drive or operate heavy machinery if experiencing this or drowsiness.  -Continue nighttime supplemental O2 therapy at 1L through BiPAP -Continue 2 lpm supplemental oxygen on POC with activity. Goal oxygen >88-90%. Notify of increasing requirements.    -Continue Breztri 2 puffs Twice daily. Brush tongue and rinse mouth afterwards.  -Continue Pulmicort inhaler 2 puffs twice a day as needed for shortness of breath or wheezing  -Continue Ventolin inhaler 2 puffs as needed every 4 hours as needed for shortness of breath or wheezing  -Continue Singulair 10 mg At bedtime -Continue Flonase 2 sprays each nostril daily  -Continue Mucinex 600 mg Twice daily for chest congestion  -Prednisone 40 mg daily for 5 days. Take in AM with food -Doxycycline 1 tab Twice  daily for 7 days. Take with food. Wear sunscreen while taking and avoid direct sun exposure as this can increase your risk for hyperpigmentation  -Restart Claritin 10 mg daily for allergies    Follow up in 2 weeks with Dr. Ander Slade or Katie Jayne Peckenpaugh,NP. If symptoms do not improve or worsen, please contact office for sooner follow up or seek emergency care   Seasonal allergic rhinitis See above. She will continue flonase and singulair.  Chronic respiratory failure with hypoxia and hypercapnia (HCC) She has been using her oxygen more but has  not been monitoring her saturations. She was able to maintain >90% on room air in office today. Advised to monitor for goal >88-90%.  We again discussed importance of compliance with BiPAP therapy and the role it plays in CO2 blow off. I encouraged her to resume therapy. She was agreeable to this. Reviewed the risks of hypercarbia. Will reach out to [facility] for download and reassess at her follow up.   Cellulitis Findings consistent with cellulitis of the leg. She has been compliant with Eliquis therapy so low suspicion for DVT. She also has a history of cellulitis in this leg. No recent abx use. We are going to cover her with doxycycline for bronchitis, which will also cover for soft tissue infection. No systemic symptoms. Strict return/ED precautions should symptoms worsen. I advised her to follow up with her PCP if this does not improve with abx course.   Chronic diastolic (congestive) heart failure (HCC) She does not appear volume overloaded on exam. She is monitoring her weights daily at home. Understands to notify cardiology if she gains 2-3 lb overnight or 5 lb in a week. She is compliant with diuretic regimen. Follow up with cardiology as scheduled.      I spent 42 minutes of dedicated to the care of this patient on the date of this encounter to include pre-visit review of records, face-to-face time with the patient discussing conditions above, post visit  ordering of testing, clinical documentation with the electronic health record, making appropriate referrals as documented, and communicating necessary findings to members of the patients care team.  Clayton Bibles, NP 05/04/2022  Pt aware and understands NP's role.

## 2022-05-04 NOTE — Patient Instructions (Addendum)
-  Continue on BiPAP auto 5-20, pressure support of 4 with backup rate of 10 for CO2 blow off and to reduce your risk of rehospitalization  Use for a minimum 6 hours a night, with goal of using entire night.  Change equipment every 30 days or as directed by DME.  Be aware of reduced alertness and do not drive or operate heavy machinery if experiencing this or drowsiness.  -Continue nighttime supplemental O2 therapy at 1L through BiPAP -Continue 2 lpm supplemental oxygen on POC with activity. Goal oxygen >88-90%. Notify of increasing requirements.    -Continue Breztri 2 puffs Twice daily. Brush tongue and rinse mouth afterwards.  -Continue Pulmicort inhaler 2 puffs twice a day as needed for shortness of breath or wheezing  -Continue Ventolin inhaler 2 puffs as needed every 4 hours as needed for shortness of breath or wheezing  -Continue Singulair 10 mg At bedtime -Continue Flonase 2 sprays each nostril daily  -Continue Mucinex 600 mg Twice daily for chest congestion  -Prednisone 40 mg daily for 5 days. Take in AM with food -Doxycycline 1 tab Twice daily for 7 days. Take with food. Wear sunscreen while taking and avoid direct sun exposure as this can increase your risk for hyperpigmentation  -Restart Claritin 10 mg daily for allergies    Follow up in 2 weeks with Dr. Ander Slade or Katie Dellis Voght,NP. If symptoms do not improve or worsen, please contact office for sooner follow up or seek emergency care

## 2022-05-04 NOTE — Assessment & Plan Note (Signed)
Asthma exacerbation with bronchitis secondary to allergies. Non-toxic appearing and VS stable on room air in office. We will treat her with steroid burst and empiric course of doxycycline. She will restart daily antihistamine for trigger prevention. Action plan in place.  Patient Instructions  -Continue on BiPAP auto 5-20, pressure support of 4 with backup rate of 10 for CO2 blow off and to reduce your risk of rehospitalization  Use for a minimum 6 hours a night, with goal of using entire night.  Change equipment every 30 days or as directed by DME.  Be aware of reduced alertness and do not drive or operate heavy machinery if experiencing this or drowsiness.  -Continue nighttime supplemental O2 therapy at 1L through BiPAP -Continue 2 lpm supplemental oxygen on POC with activity. Goal oxygen >88-90%. Notify of increasing requirements.    -Continue Breztri 2 puffs Twice daily. Brush tongue and rinse mouth afterwards.  -Continue Pulmicort inhaler 2 puffs twice a day as needed for shortness of breath or wheezing  -Continue Ventolin inhaler 2 puffs as needed every 4 hours as needed for shortness of breath or wheezing  -Continue Singulair 10 mg At bedtime -Continue Flonase 2 sprays each nostril daily  -Continue Mucinex 600 mg Twice daily for chest congestion  -Prednisone 40 mg daily for 5 days. Take in AM with food -Doxycycline 1 tab Twice daily for 7 days. Take with food. Wear sunscreen while taking and avoid direct sun exposure as this can increase your risk for hyperpigmentation  -Restart Claritin 10 mg daily for allergies    Follow up in 2 weeks with Dr. Ander Slade or Katie Haileigh Pitz,NP. If symptoms do not improve or worsen, please contact office for sooner follow up or seek emergency care

## 2022-05-04 NOTE — Assessment & Plan Note (Signed)
She has been using her oxygen more but has not been monitoring her saturations. She was able to maintain >90% on room air in office today. Advised to monitor for goal >88-90%.  We again discussed importance of compliance with BiPAP therapy and the role it plays in CO2 blow off. I encouraged her to resume therapy. She was agreeable to this. Reviewed the risks of hypercarbia. Will reach out to [facility] for download and reassess at her follow up.

## 2022-05-04 NOTE — Assessment & Plan Note (Signed)
Findings consistent with cellulitis of the leg. She has been compliant with Eliquis therapy so low suspicion for DVT. She also has a history of cellulitis in this leg. No recent abx use. We are going to cover her with doxycycline for bronchitis, which will also cover for soft tissue infection. No systemic symptoms. Strict return/ED precautions should symptoms worsen. I advised her to follow up with her PCP if this does not improve with abx course.

## 2022-05-04 NOTE — Assessment & Plan Note (Addendum)
She does not appear volume overloaded on exam. She is monitoring her weights daily at home. Understands to notify cardiology if she gains 2-3 lb overnight or 5 lb in a week. She is compliant with diuretic regimen. Follow up with cardiology as scheduled.

## 2022-05-04 NOTE — Assessment & Plan Note (Signed)
See above. She will continue flonase and singulair.

## 2022-05-07 ENCOUNTER — Other Ambulatory Visit: Payer: Self-pay

## 2022-05-07 ENCOUNTER — Telehealth: Payer: Self-pay | Admitting: Internal Medicine

## 2022-05-07 NOTE — Telephone Encounter (Signed)
Patient called and said she has one more dose of her tirzepatide [facility]) 15 MG/0.5ML Pen . She said she's called around and no other pharmacies have it. She said her insurance doesn't cover [phone]. She would like to know what Dr. Quay Burow would like to do. She would like a call back at [phone].

## 2022-05-07 NOTE — Telephone Encounter (Signed)
Spoke with patient today.  She will try to call around tomorrow and touch basis if she wants to switch.

## 2022-05-07 NOTE — Telephone Encounter (Signed)
April [name] is on backorder which is a national problem.  The higher doses are not available.  I do not know if a much lower dose is available which is 1 option.  The other options since she is a diabetic is either Trulicity or Ozempic.  Eventually these will be in short supply as well since many people are switching to them.  We can try 1 of those-I am not sure which her insurance will cover.  We would need to start at a lower dose and then build up.

## 2022-05-08 ENCOUNTER — Telehealth: Payer: Self-pay | Admitting: Nurse Practitioner

## 2022-05-08 ENCOUNTER — Other Ambulatory Visit: Payer: Self-pay

## 2022-05-08 ENCOUNTER — Other Ambulatory Visit (HOSPITAL_COMMUNITY): Payer: Self-pay

## 2022-05-08 ENCOUNTER — Other Ambulatory Visit: Payer: Self-pay | Admitting: Internal Medicine

## 2022-05-08 MED ORDER — TRULICITY 0.75 MG/0.5ML ~~LOC~~ SOAJ: 0.7500 mg | SUBCUTANEOUS | 0 refills | Status: DC

## 2022-05-08 NOTE — Telephone Encounter (Signed)
Prescription for breztri was sent to [facility] instead of delivering it to pt home  Fax: [phone]

## 2022-05-08 NOTE — Telephone Encounter (Signed)
Spoke with patient today. 

## 2022-05-08 NOTE — Addendum Note (Signed)
Addended by: Pincus Sanes on: 05/08/2022 12:02 PM   Modules accepted: Orders

## 2022-05-08 NOTE — Telephone Encounter (Signed)
PT follows back up and stated that their are two pharmacies that have the Trulicity in.  [facility] on [address] has 0/7 in the [employer] on [address] has 0.75 Trulicity  CB: [phone]

## 2022-05-08 NOTE — Telephone Encounter (Signed)
Sent to [facility].

## 2022-05-11 ENCOUNTER — Other Ambulatory Visit: Payer: Self-pay | Admitting: Internal Medicine

## 2022-05-11 ENCOUNTER — Other Ambulatory Visit (HOSPITAL_COMMUNITY): Payer: Self-pay

## 2022-05-11 ENCOUNTER — Other Ambulatory Visit: Payer: Self-pay

## 2022-05-11 MED ORDER — BREZTRI AEROSPHERE 160-9-4.8 MCG/ACT IN AERO: 2.0000 | INHALATION_SPRAY | Freq: Two times a day (BID) | RESPIRATORY_TRACT | 3 refills | Status: DC

## 2022-05-11 MED ORDER — BREZTRI AEROSPHERE 160-9-4.8 MCG/ACT IN AERO: 2.0000 | INHALATION_SPRAY | Freq: Two times a day (BID) | RESPIRATORY_TRACT | 5 refills | Status: DC

## 2022-05-11 MED ORDER — MOUNJARO 12.5 MG/0.5ML ~~LOC~~ SOAJ
12.5000 mg | SUBCUTANEOUS | 3 refills | Status: DC
  Filled 2022-05-11 – 2022-07-27 (×2): qty 2, 28d supply, fill #0

## 2022-05-11 NOTE — Telephone Encounter (Signed)
I called and spoke with the pt  She states needing Breztri sent to the mail order pharm that is used for pt assistance  I have sent rx to [facility]  Nothing further needed

## 2022-05-11 NOTE — Telephone Encounter (Signed)
Patient called and said that [facility] is going to have the [facility] - fax [phone] - 15 mg. - they can send a month.  Paitent would like for nurse or cma to give her a call.  Number:  [phone]

## 2022-05-11 NOTE — Telephone Encounter (Signed)
ATC X1 LVM for patient to call the office back. Please advise breztri has been sent to mail order pharmacy

## 2022-05-12 ENCOUNTER — Telehealth: Payer: Self-pay | Admitting: Internal Medicine

## 2022-05-12 ENCOUNTER — Other Ambulatory Visit: Payer: Self-pay

## 2022-05-12 MED ORDER — TIRZEPATIDE 15 MG/0.5ML ~~LOC~~ SOAJ: 15.0000 mg | SUBCUTANEOUS | 2 refills | Status: DC

## 2022-05-12 MED ORDER — MOUNJARO 12.5 MG/0.5ML ~~LOC~~ SOAJ: 12.5000 mg | SUBCUTANEOUS | 3 refills | Status: DC

## 2022-05-12 NOTE — Telephone Encounter (Signed)
Patient said tirzepatide [facility]) 12.5 MG/0.5ML Pen  was sent to the wrong pharmacy. She said it is supposed to be sent to [facility] - [place], [place] - [address]. She would like the [facility] to be removed from her profile. Best callback is [phone].

## 2022-05-12 NOTE — Telephone Encounter (Signed)
Script sent in to pharmacy today.  Info updated.  Patient informed

## 2022-05-12 NOTE — Telephone Encounter (Signed)
Patient would stll like April [name] to call her.

## 2022-05-14 ENCOUNTER — Telehealth: Payer: Self-pay | Admitting: Internal Medicine

## 2022-05-15 ENCOUNTER — Ambulatory Visit (INDEPENDENT_AMBULATORY_CARE_PROVIDER_SITE_OTHER): Payer: Medicare PPO

## 2022-05-15 DIAGNOSIS — E538 Deficiency of other specified B group vitamins: Secondary | ICD-10-CM

## 2022-05-15 DIAGNOSIS — M2142 Flat foot [pes planus] (acquired), left foot: Secondary | ICD-10-CM

## 2022-05-15 DIAGNOSIS — I82409 Acute embolism and thrombosis of unspecified deep veins of unspecified lower extremity: Secondary | ICD-10-CM | POA: Diagnosis not present

## 2022-05-15 DIAGNOSIS — N390 Urinary tract infection, site not specified: Secondary | ICD-10-CM | POA: Diagnosis not present

## 2022-05-15 DIAGNOSIS — I890 Lymphedema, not elsewhere classified: Secondary | ICD-10-CM | POA: Diagnosis not present

## 2022-05-15 DIAGNOSIS — D631 Anemia in chronic kidney disease: Secondary | ICD-10-CM | POA: Diagnosis not present

## 2022-05-15 DIAGNOSIS — E1142 Type 2 diabetes mellitus with diabetic polyneuropathy: Secondary | ICD-10-CM

## 2022-05-15 DIAGNOSIS — E1122 Type 2 diabetes mellitus with diabetic chronic kidney disease: Secondary | ICD-10-CM | POA: Diagnosis not present

## 2022-05-15 DIAGNOSIS — M2141 Flat foot [pes planus] (acquired), right foot: Secondary | ICD-10-CM

## 2022-05-15 DIAGNOSIS — N189 Chronic kidney disease, unspecified: Secondary | ICD-10-CM | POA: Diagnosis not present

## 2022-05-15 DIAGNOSIS — I5032 Chronic diastolic (congestive) heart failure: Secondary | ICD-10-CM | POA: Diagnosis not present

## 2022-05-15 DIAGNOSIS — N2581 Secondary hyperparathyroidism of renal origin: Secondary | ICD-10-CM | POA: Diagnosis not present

## 2022-05-15 DIAGNOSIS — I739 Peripheral vascular disease, unspecified: Secondary | ICD-10-CM | POA: Diagnosis not present

## 2022-05-15 DIAGNOSIS — N184 Chronic kidney disease, stage 4 (severe): Secondary | ICD-10-CM | POA: Diagnosis not present

## 2022-05-15 MED ORDER — CYANOCOBALAMIN 1000 MCG/ML IJ SOLN
1000.0000 ug | Freq: Once | INTRAMUSCULAR | Status: AC
Start: 2022-05-15 — End: 2022-05-15
  Administered 2022-05-15: 1000 ug via INTRAMUSCULAR

## 2022-05-15 NOTE — Progress Notes (Signed)
Pt was given B12 injection w/o any complications. 

## 2022-05-15 NOTE — Progress Notes (Signed)
Patient presents to the office today for diabetic shoe and insole measuring.  ABN signed.   Documentation of medical necessity will be sent to patient's treating diabetic doctor to verify and sign.   Patient's diabetic provider: Pincus Sanes, MD   Shoes and insoles will be ordered at that time and patient will be notified for an appointment for fitting when they arrive.   Patient shoe selection-   1st   Shoe choice:   [id number]  Shoe size ordered: 22M

## 2022-05-17 DIAGNOSIS — I5033 Acute on chronic diastolic (congestive) heart failure: Secondary | ICD-10-CM | POA: Diagnosis not present

## 2022-05-17 DIAGNOSIS — J9601 Acute respiratory failure with hypoxia: Secondary | ICD-10-CM | POA: Diagnosis not present

## 2022-05-17 DIAGNOSIS — J9602 Acute respiratory failure with hypercapnia: Secondary | ICD-10-CM | POA: Diagnosis not present

## 2022-05-18 ENCOUNTER — Other Ambulatory Visit (HOSPITAL_COMMUNITY): Payer: Self-pay

## 2022-05-18 ENCOUNTER — Other Ambulatory Visit: Payer: Self-pay | Admitting: Internal Medicine

## 2022-05-18 LAB — LAB REPORT - SCANNED
Albumin, Urine POC: 14.8
EGFR: 33
Microalb Creat Ratio: 15

## 2022-05-18 MED ORDER — MOUNJARO 12.5 MG/0.5ML ~~LOC~~ SOAJ
12.5000 mg | SUBCUTANEOUS | 3 refills | Status: DC
  Filled 2022-05-18: qty 2, 28d supply, fill #0

## 2022-05-18 NOTE — Addendum Note (Signed)
Addended by: Deatra James on: 05/18/2022 12:09 PM   Modules accepted: Orders

## 2022-05-18 NOTE — Telephone Encounter (Signed)
Patient called and said she spoke with [facility]. They said they only have a few of the 12.5 Mounjaro left. She would like for Dr. Lawerance Bach to send a prescription there ASAP because [facility] is out until May. Best callback is [phone].

## 2022-05-18 NOTE — Telephone Encounter (Signed)
Called pt to see which [facility]. Pt states send rx to [facility]. Resent to [place].Marland KitchenRaechel Chute

## 2022-05-19 ENCOUNTER — Other Ambulatory Visit (HOSPITAL_COMMUNITY): Payer: Self-pay

## 2022-05-21 ENCOUNTER — Ambulatory Visit: Payer: Medicare PPO | Admitting: Nurse Practitioner

## 2022-05-22 ENCOUNTER — Ambulatory Visit: Payer: Medicare PPO

## 2022-05-22 DIAGNOSIS — M1711 Unilateral primary osteoarthritis, right knee: Secondary | ICD-10-CM | POA: Diagnosis not present

## 2022-05-28 ENCOUNTER — Other Ambulatory Visit (HOSPITAL_COMMUNITY): Payer: Self-pay

## 2022-05-28 ENCOUNTER — Other Ambulatory Visit: Payer: Self-pay

## 2022-05-28 DIAGNOSIS — E1140 Type 2 diabetes mellitus with diabetic neuropathy, unspecified: Secondary | ICD-10-CM

## 2022-05-28 MED ORDER — TIRZEPATIDE 15 MG/0.5ML ~~LOC~~ SOAJ
15.0000 mg | SUBCUTANEOUS | 2 refills | Status: DC
Start: 2022-05-28 — End: 2022-06-17
  Filled 2022-05-28: qty 2, 28d supply, fill #0

## 2022-05-28 NOTE — Telephone Encounter (Signed)
Patient is calling again to see why she is not getting her medication free.  She has called several times to get this worked out and still has not had any success.  She said K. Cobb had approved her for the program.  Patient would like a call asap to see why she is not getting her medication.  Please call at [phone].

## 2022-05-29 DIAGNOSIS — M1711 Unilateral primary osteoarthritis, right knee: Secondary | ICD-10-CM | POA: Diagnosis not present

## 2022-06-01 ENCOUNTER — Telehealth: Payer: Self-pay | Admitting: Podiatry

## 2022-06-01 NOTE — Telephone Encounter (Signed)
Patient called checking on her diabetic shoes.  Checked [employer] there is no order for her there is for her husband ? Patient would like a call back

## 2022-06-02 ENCOUNTER — Other Ambulatory Visit: Payer: Self-pay | Admitting: Internal Medicine

## 2022-06-03 ENCOUNTER — Telehealth: Payer: Self-pay | Admitting: Internal Medicine

## 2022-06-03 ENCOUNTER — Encounter: Payer: Self-pay | Admitting: Nurse Practitioner

## 2022-06-03 ENCOUNTER — Ambulatory Visit: Payer: Medicare PPO | Admitting: Nurse Practitioner

## 2022-06-03 VITALS — BP 130/70 | HR 92 | Temp 97.7°F | Ht 62.0 in | Wt 319.0 lb

## 2022-06-03 DIAGNOSIS — J9611 Chronic respiratory failure with hypoxia: Secondary | ICD-10-CM

## 2022-06-03 DIAGNOSIS — J9612 Chronic respiratory failure with hypercapnia: Secondary | ICD-10-CM

## 2022-06-03 DIAGNOSIS — J301 Allergic rhinitis due to pollen: Secondary | ICD-10-CM

## 2022-06-03 DIAGNOSIS — J45909 Unspecified asthma, uncomplicated: Secondary | ICD-10-CM | POA: Diagnosis not present

## 2022-06-03 NOTE — Assessment & Plan Note (Addendum)
We again discussed importance of compliance with BiPAP therapy and the role it plays in CO2 blow off. I encouraged her to resume therapy. She was agreeable to this. Reviewed the risks of hypercarbia. Will reach out to [facility] for download and reassess at her follow up  She had walk test today with desaturation to 87% on room air, recovered with 2 lpm supplemental O2, which is baseline for her and re-qualifies her for O2 therapy. She will continue to use 2 lpm with activity and 1 lpm with BiPAP at night. Goal >88-90%

## 2022-06-03 NOTE — Assessment & Plan Note (Signed)
Improve and compensated on current regimen.

## 2022-06-03 NOTE — Assessment & Plan Note (Signed)
Mild recent exacerbation related to allergies. Clinically improved and compensated on current regimen. Asthma action plan in place.  Patient Instructions  -Restart BiPAP auto 5-20, pressure support of 4 with backup rate of 10 for CO2 blow off and to reduce your risk of rehospitalization  Use for a minimum 6 hours a night, with goal of using entire night.  Change equipment every 30 days or as directed by DME.  Be aware of reduced alertness and do not drive or operate heavy machinery if experiencing this or drowsiness.  -Continue nighttime supplemental O2 therapy at 1L through BiPAP -Continue 2 lpm supplemental oxygen on POC with activity. Goal oxygen >88-90%. Notify of increasing requirements.    -Continue Breztri 2 puffs Twice daily. Brush tongue and rinse mouth afterwards.  -Continue Pulmicort inhaler 2 puffs twice a day as needed for shortness of breath or wheezing  -Continue Ventolin inhaler 2 puffs as needed every 4 hours as needed for shortness of breath or wheezing  -Continue Singulair 10 mg At bedtime -Continue Flonase 2 sprays each nostril daily  -Continue Mucinex 600 mg Twice daily for chest congestion -Continue Claritin 10 mg daily for allergies    Follow up in 4 months with Dr. Wynona Neat or Katie Ellenore Roscoe,NP. If symptoms do not improve or worsen, please contact office for sooner follow up or seek emergency care

## 2022-06-03 NOTE — Patient Instructions (Addendum)
-  Restart BiPAP auto 5-20, pressure support of 4 with backup rate of 10 for CO2 blow off and to reduce your risk of rehospitalization  Use for a minimum 6 hours a night, with goal of using entire night.  Change equipment every 30 days or as directed by DME.  Be aware of reduced alertness and do not drive or operate heavy machinery if experiencing this or drowsiness.  -Continue nighttime supplemental O2 therapy at 1L through BiPAP -Continue 2 lpm supplemental oxygen on POC with activity. Goal oxygen >88-90%. Notify of increasing requirements.    -Continue Breztri 2 puffs Twice daily. Brush tongue and rinse mouth afterwards.  -Continue Pulmicort inhaler 2 puffs twice a day as needed for shortness of breath or wheezing  -Continue Ventolin inhaler 2 puffs as needed every 4 hours as needed for shortness of breath or wheezing  -Continue Singulair 10 mg At bedtime -Continue Flonase 2 sprays each nostril daily  -Continue Mucinex 600 mg Twice daily for chest congestion -Continue Claritin 10 mg daily for allergies    Follow up in 4 months with Dr. Wynona Neat or Katie Loukisha Gunnerson,NP. If symptoms do not improve or worsen, please contact office for sooner follow up or seek emergency care

## 2022-06-03 NOTE — Progress Notes (Signed)
@Patient  ID: April [name], female    DOB: 12/20/1950, 72 y.o.   MRN: [id number]  Chief Complaint  Patient presents with   Follow-up    Pt states she is feeling better, still having dry cough has been using nebulizer     Referring provider: Pincus Sanes, MD  HPI: 72 year old female, never smoker followed for nocturnal hypoxemia, pulmonary hypertension, obesity hypoventilation syndrome and asthma.  She is a patient of Dr. Trena Platt and last seen in office 05/04/2022 by Allison Quarry NP.  Past medical history significant for CHF, former DVT on chronic anticoagulation, hypertension, CAD, GERD, PCP, DM2, morbid obesity, CKD stage III, HLD  TEST/EVENTS:  05/07/2020 CTA chest: Mild cardiomegaly suggestive of PA hypertension.  Atherosclerosis.  No lymphadenopathy.  Minimal groundglass opacities in the lung bases.  There is slight suboptimal opacification of main PA without central or proximal segmental PE.  Small hiatal hernia 11/18/2020 echocardiogram: EF 65 to 70%.  LVH.  G1 DD.  The interventricular septum is flattened consistent with RV pressure and volume overload 11/19/2020 right heart cath: Mild to moderate pulmonary hypertension there is minimally elevated PC WP.  Normal CO 12/09/2020 split-night study: Negative for OSA.  Nocturnal desaturations to 84% requiring 1 L/min O2.  Recommended continued use of BiPAP for CO2 blow off and 1 L supplemental O2 at night 04/04/2021 CXR 2 view: Frontal and lateral views of the chest were unremarkable.  There was no acute airspace disease noted. 08/29/2021 CXR: vascular congestion. Lower lung interstitial opacities suggestive of edema   10/02/2020: OV with Dr. Wynona Neat. Scheduled in lab split-night. Lunesta low dose for sleep. Weight loss advised but limits with severe arthritis. Encouraged to decrease naps and consolidate sleep at night. F/u in 3-4 months.    11/15/2020 - 11/23/2018: Hospital admission for acute on chronic CHF exacerbation and acute on chronic  hypoxic hypercapnic respiratory failure.  pH 7.29 PCO2 of 80 and PO2 of 60. IV diuresis with Lasix.  Right heart cath on 10/19.  PCCM consulted with recommendations for BiPAP versus NIPPV upon discharge.   01/03/2021: OV with Agam Davenport NP for follow up after sleep study and overdue hospital follow up. Breathing stable. Continued on BiPAP and supplemental oxygen at night 1 lpm. Continued on Symbicort for maintenance. PRN Ventolin. Singulair at bedtime.   04/04/2021: OV with Marshae Azam NP for follow-up.  Felt like she had been having to use her oxygen more during the day and not just at night.  Progressive DOE.  Consistently wearing 2 L/min with activity; reports she has been doing this for the past few months.  Started after we saw her last.  Continues to wear BiPAP at night along with 1 L/min supplemental O2.  She was concerned that her asthma was flaring as she had not had any increased cough over the past week.  Suspected that DOE was multifactorial related to CHF, obesity, deconditioning and slight asthma component.  She was unable to complete FeNO however there was no significant bronchospasm on exam.  Trialed step up in therapy to [facility].  Continued Singulair at night.  Added Claritin for trigger prevention.  Flonase for postnasal drainage control.  And Delsym and Tessalon Perles for cough control.  Suspected that there was an upper airway component to her cough.  Walking oximetry with desaturations to 84% on room air.  She was placed on 3 L/min POC and maintain saturations in the 90s. CXR and labs unremarkable.   05/16/2021: OV with Gray Doering NP for follow-up.  Overall her  breathing has been relatively stable and at baseline.  Feels like she did notice a improvement with the Breztri.  She ran out of samples and went back to her Symbicort.  Rx sent for [employer]. Cough is overall stable as well.  Relatively infrequent and usually dry.  Her biggest complaint was that she fell at the end of March and has had generalized pain  since.  She does have a place in her groin where she landed on her cane that is giving her the most trouble.  She is [facility] to [facility]CP [facility] after this visit today to discuss this.  She denies any lightheaded or dizziness.  No loss of consciousness with fall.  Allergies seem relatively well controlled.  Does still have occasional postnasal drip but improved with her changes last time.  She continues on BiPAP nightly.  She has yet to receive her POC from her DME company.  She has been using her oxygen concentrator at home.  She is on room air currently in office with oxygen saturations in the 90s.  She is usually able to maintain saturations at rest but drops to the 80s with activity - qualified for POC 3 lpm at OV so orders sent to DME.    08/15/2021: OV with Zuhair Lariccia NP for follow up with her husband. She reports that her breathing has been stable. She is feeling well on the [place]. She still occasionally has some wheezing but usually clears on it's own. She rarely has to use her albuterol. She gets winded with long distances and climbing, and is normally relatively sedentary; this is unchanged from her baseline and some is related to her chronic knee pain. She is actively trying to lose weight and is down about 20 pounds. Allergies are well controlled. She wears her BiPAP nightly. She finally got her POC machine this morning and has herself set to 2 lpm which she is 98% on. Rare use of pulmicort or albuterol inhalers. No concerns or complaints today.   11/21/2021: OV with Dr. Wynona Neat. Continues to do well. Some weight loss. Denies any significant symptoms at present. She continues to use [employer]. She is using BiPAP nightly for hypercapnic respiratory failure. Oxygen still drops with activity.   05/04/2022: OV with Jawanza Zambito NP for follow up with her husband. She was doing pretty well up until the last few weeks. She's been having some more trouble with her breathing and chest tightness. She's also been  feeling like her sinuses are congested, having some occasional headaches, and dealing with a cough that is minimally productive at times with yellow phlegm. She is having similar colored nasal drainage. She thinks it is all related to her allergies. She has noticed an occasional wheeze. She is also worried about a tender and more red spot on her right lower leg. She first noticed this around a week ago. Denies any drainage, fevers, chills, increased leg swelling. No weight gain, orthopnea, PND. No known sick exposures. She has not missed any doses of her Eliquis. Eating and drinking well. She is using her Breztri twice a day, taking singulair and using flonase. She has not been on Claritin as she ran out. She has not been using her rescue inhalers but she has been using her oxygen more. She admits to decreased use of BiPAP as she prefers to wear her oxygen at night.   06/03/2022: Today - follow up Patient presents today for follow up. Feeling better after our last visit. Still has some  occasional cough with clear to white phlegm. Chest and sinus congestion are better. Usually depends on the day and the pollen. Denies any fevers, chills, hemoptysis, increased leg swelling, orthopnea. She was told by her DME company that she needs to repeat her oxygen qualification. She's not sure why because she just got her POC in July of last year. She has a BiPAP, which is paid off. Not sleeping with it because she prefers the oxygen.   Allergies  Allergen Reactions   Penicillins Rash and Other (See Comments)    She was told not to take it anymore. Has patient had a PCN reaction causing immediate rash, facial/tongue/throat swelling, SOB or lightheadedness with hypotension: Yes Has patient had a PCN reaction causing severe rash involving mucus membranes or skin necrosis: No Has patient had a PCN reaction that required hospitalization: No Has patient had a PCN reaction occurring within the last 10 years: No If all of the  above answers are "NO", then may proceed with Cephalosporin use.'   Shellfish Allergy Anaphylaxis   Sulfonamide Derivatives Anaphylaxis and Other (See Comments)   Hydrochlorothiazide W-Triamterene Other (See Comments)    Hypokalemia   Lotensin [Benazepril Hcl] Hives   Dipyridamole Other (See Comments)    Unknown reaction   Estrogens Other (See Comments)    Unknown reaction   Hydrochlorothiazide Other (See Comments)    Unknown reaction, Hypokalemia?   Latex Other (See Comments)    Unknown reaction - stated previously during surgery gloves had to be changed, but unsure if it is still an allergy or not.    Metronidazole Other (See Comments)    Unknown reaction   Other Itching    PICKLES   Spironolactone Other (See Comments)    UNSPECIFIED > "kidney problems"   Torsemide Other (See Comments)    Unknown reaction   Valsartan Other (See Comments)    Unknown reaction   Mustard [Allyl Isothiocyanate] Itching    Immunization History  Administered Date(s) Administered   Fluad Quad(high Dose 65+) 10/28/2018, 10/18/2020, 11/07/2021   Influenza Split 12/03/2010, 11/03/2011   Influenza Whole 11/16/2006, 11/22/2007, 10/31/2008, 10/29/2009   Influenza, High Dose Seasonal PF 10/10/2015, 11/06/2016, 11/16/2017, 11/08/2019   Influenza,inj,Quad PF,6+ Mos 10/19/2012, 10/25/2013, 10/29/2014   Influenza-Unspecified 10/23/2016, 11/18/2016   Moderna Sars-Covid-2 Vaccination 03/13/2019, 04/10/2019, 12/06/2019   PFIZER Comirnaty(Gray Top)Covid-19 Tri-Sucrose Vaccine 11/14/2021   Pfizer Covid-19 Vaccine Bivalent Booster 61yrs & up 11/08/2020   Pneumococcal Conjugate-13 06/07/2015   Pneumococcal Polysaccharide-23 03/24/2005, 01/04/2012, 06/29/2017   Td 06/24/2009   Tdap 10/09/2014   Zoster Recombinat (Shingrix) 08/11/2016, 10/23/2016   Zoster, Live 01/15/2012    Past Medical History:  Diagnosis Date   Anemia    Asthma    CAD (coronary artery disease)    Carpal tunnel syndrome    Cellulitis of  both lower extremities 04/11/2015   CHF (congestive heart failure) (HCC)    Chronic kidney disease    CKI- followed by [facility]   Colon polyp, hyperplastic 2007 & 2012   Complication of anesthesia 1999   svt with renal calculi surgery, no problems since   CTS (carpal tunnel syndrome)    bilateral   Diabetes mellitus    Eczema    GERD (gastroesophageal reflux disease)    History of kidney stones 1999   Hyperlipidemia    Hypertension    Leg ulcer (HCC) 04/24/2015   Right lateral leg No evidence of an infection Monitor closely Keep edema controlled    Leg ulcer (HCC)  right lower   Meralgia paresthetica    Dr. Terrace Arabia   Morbid obesity [facility])    Neuropathy    toes and legs   Osteoarthrosis, unspecified whether generalized or localized, lower leg    knee   PUD (peptic ulcer disease)    Shortness of breath dyspnea    with exertion   Sleep apnea    per progress note 02/25/2018   Type II or unspecified type diabetes mellitus without mention of complication, not stated as uncontrolled    Unspecified hereditary and idiopathic peripheral neuropathy    Urticaria    Vitamin B12 deficiency    Wears glasses    Wound cellulitis    right upper leg, healing well    Tobacco History: Social History   Tobacco Use  Smoking Status Never  Smokeless Tobacco Never   Counseling given: Not Answered   Outpatient Medications Prior to Visit  Medication Sig Dispense Refill   acetaminophen (TYLENOL) 650 MG CR tablet Take 1,300 mg by mouth every 8 (eight) hours as needed for pain.     Alcohol Swabs (DROPSAFE ALCOHOL PREP) 70 % PADS USE TOPICALLY AS DIRECTED 300 each 3   allopurinol (ZYLOPRIM) 100 MG tablet TAKE 2 TABLETS(200 MG) BY MOUTH DAILY 180 tablet 1   apixaban (ELIQUIS) 2.5 MG TABS tablet Take 1 tablet (2.5 mg total) by mouth 2 (two) times daily. 180 tablet 1   atorvastatin (LIPITOR) 40 MG tablet TAKE 1 TABLET(40 MG) BY MOUTH AT BEDTIME 90 tablet 2   Blood Glucose Calibration (TRUE  METRIX LEVEL 1) Low SOLN UAD  E11.9 1 each 3   Blood Glucose Monitoring Suppl (TRUE METRIX METER) DEVI True Metrix Level 1 solution     Budeson-Glycopyrrol-Formoterol (BREZTRI AEROSPHERE) 160-9-4.8 MCG/ACT AERO Inhale 2 puffs into the lungs in the morning and at bedtime. 32.1 g 3   calcium carbonate (TUMS - DOSED IN MG ELEMENTAL CALCIUM) 500 MG chewable tablet Chew 2 tablets by mouth daily as needed for indigestion or heartburn.     cyanocobalamin (,VITAMIN B-12,) 1000 MCG/ML injection INJECT INTO MUSCLE EVERY 30 DAYS 10 mL 5   dapagliflozin propanediol (FARXIGA) 10 MG TABS tablet Take 10 mg by mouth daily.     diclofenac Sodium (VOLTAREN) 1 % GEL Apply 1 application topically as needed (pain).     diphenhydrAMINE (BENADRYL) 12.5 MG/5ML liquid Take 25 mg by mouth 4 (four) times daily as needed for allergies.     doxycycline (VIBRA-TABS) 100 MG tablet Take 1 tablet (100 mg total) by mouth 2 (two) times daily. 14 tablet 0   Dulaglutide (TRULICITY) 0.75 MG/0.5ML SOPN Inject 0.75 mg into the skin once a week. 2 mL 0   eszopiclone (LUNESTA) 1 MG TABS tablet Take 1 mg by mouth at bedtime as needed for sleep. Take immediately before bedtime     famotidine (PEPCID) 20 MG tablet TAKE 1 TABLET(20 MG) BY MOUTH TWICE DAILY 180 tablet 3   FEROSUL 325 (65 Fe) MG tablet TAKE 1 TABLET BY MOUTH EVERY DAY ON MONDAY, WEDNESDAY, FRIDAYS 60 tablet 0   fluticasone (FLONASE) 50 MCG/ACT nasal spray Place 2 sprays into both nostrils daily. 18.2 mL 2   gabapentin (NEURONTIN) 100 MG capsule TAKE 1 CAPSULE(100 MG) BY MOUTH THREE TIMES DAILY 90 capsule 1   guaiFENesin-dextromethorphan (ROBITUSSIN DM) 100-10 MG/5ML syrup Take 5 mLs by mouth every 4 (four) hours as needed for cough.     hydrOXYzine (ATARAX) 25 MG tablet Take 1 tablet (25 mg total) by mouth  3 (three) times daily as needed for itching. 90 tablet 1   loratadine (CLARITIN) 10 MG tablet Take 1 tablet (10 mg total) by mouth daily. 30 tablet 5   montelukast  (SINGULAIR) 10 MG tablet TAKE 1 TABLET(10 MG) BY MOUTH AT BEDTIME 30 tablet 5   nebivolol (BYSTOLIC) 5 MG tablet TAKE 1 TABLET(5 MG) BY MOUTH DAILY 30 tablet 3   ondansetron (ZOFRAN) 4 MG tablet Take 1 tablet (4 mg total) by mouth every 8 (eight) hours as needed for nausea or vomiting. 20 tablet 0   Oxcarbazepine (TRILEPTAL) 300 MG tablet Take 1 tablet (300 mg total) by mouth 2 (two) times daily. 180 tablet 1   oxyCODONE (OXY IR/ROXICODONE) 5 MG immediate release tablet Take 1 tablet (5 mg total) by mouth every 6 (six) hours as needed for severe pain (Chronic knee osteoarthritis). 30 tablet 0   polyethylene glycol (MIRALAX / GLYCOLAX) 17 g packet Take 17 g by mouth daily as needed for moderate constipation.     potassium chloride SA (KLOR-CON M) 20 MEQ tablet Take 1 tablet (20 mEq total) by mouth 2 (two) times daily. 180 tablet 3   Spacer/Aero-Holding [facility] Use with inhaler. 1 each 2   spironolactone (ALDACTONE) 25 MG tablet TAKE 1 TABLET(25 MG) BY MOUTH DAILY 90 tablet 0   tirzepatide (MOUNJARO) 12.5 MG/0.5ML Pen Inject 12.5 mg into the skin once a week. 2 mL 3   tirzepatide (MOUNJARO) 15 MG/0.5ML Pen Inject 15 mg into the skin once a week. 6 mL 2   torsemide (DEMADEX) 20 MG tablet Take 40 mg in AM and 20 mg in PM 360 tablet 3   triamcinolone ointment (KENALOG) 0.5 % Apply 1 Application topically as needed (knee pain).     VENTOLIN HFA 108 (90 Base) MCG/ACT inhaler Inhale 2 puffs into the lungs every 4 (four) hours as needed for wheezing.     No facility-administered medications prior to visit.     Review of Systems:   Constitutional: No weight loss or gain, night sweats, fevers, chills, +mild fatigue (chronic) HEENT: No headaches, difficulty swallowing, tooth/dental problems, or sore throat. No sneezing, itching, ear ache. +improved nasal congestion/drainage CV:  No chest pain, orthopnea, PND, swelling in lower extremities, anasarca, dizziness, palpitations, syncope Resp: +shortness  of breath with exertion (baseline); minimally productive cough (baseline); occasional wheeze. No hemoptysis.  No chest wall deformity GI:  No heartburn, indigestion, abdominal pain, nausea, vomiting, diarrhea, change in bowel habits, loss of appetite, bloody stools.  GU: No dysuria, change in color of urine, urgency or frequency.   Skin: No rash.  MSK:  +knee pain (chronic). No joint swelling.  No decreased range of motion.  No back pain. Neuro: No dizziness or lightheadedness.  Psych: No depression or anxiety. Mood stable.     Physical Exam:  BP 130/70   Pulse 92   Temp 97.7 F (36.5 C) (Oral)   Ht 5\' 2"  (1.575 m)   Wt (!) 319 lb (144.7 kg)   SpO2 98%   BMI 58.35 kg/m   GEN: Pleasant, interactive, chronically-ill appearing; morbidly obese; in no acute distress. HEENT:  Normocephalic and atraumatic. PERRLA. Sclera white. Nasal turbinates pink, moist and patent bilaterally. No rhinorrhea present. Oropharynx pink and moist, without exudate or edema. No lesions, ulcerations NECK:  Supple w/ fair ROM. No JVD present.  CV: RRR, no m/r/g, no peripheral edema. Pulses intact, +2 bilaterally. No cyanosis, pallor or clubbing. PULMONARY:  Unlabored, regular breathing. Clear bilaterally A&P w/ wheezes/rales/rhonchi.  No accessory muscle use. No dullness to percussion. GI: BS present and normoactive. Soft, non-tender to palpation.  MSK: No erythema, warmth or tenderness to left knee. Cap refil <2 sec all extrem. No deformities or joint swelling noted.  Neuro: A/Ox3. No focal deficits noted.   Skin: Intact, warm. Psych: Normal affect and behavior. Judgement and thought content appropriate.     Lab Results:  CBC    Component Value Date/Time   WBC 5.0 02/24/2022 1002   RBC 4.03 02/24/2022 1002   HGB 12.6 02/24/2022 1002   HGB 11.3 (L) 06/13/2021 1339   HCT 40.0 02/24/2022 1002   PLT 147 (L) 02/24/2022 1002   PLT 206 06/13/2021 1339   MCV 99.3 02/24/2022 1002   MCH 31.3 02/24/2022 1002    MCHC 31.5 02/24/2022 1002   RDW 15.1 02/24/2022 1002   LYMPHSABS 1.3 12/30/2021 1056   MONOABS 0.4 12/30/2021 1056   EOSABS 0.1 12/30/2021 1056   BASOSABS 0.1 12/30/2021 1056    BMET    Component Value Date/Time   NA 138 02/24/2022 1002   NA 145 (H) 10/25/2012 1117   K 4.6 02/24/2022 1002   CL 98 02/24/2022 1002   CO2 33 (H) 02/24/2022 1002   GLUCOSE 90 02/24/2022 1002   BUN 15 02/24/2022 1002   BUN 24 10/25/2012 1117   CREATININE 1.70 (H) 02/24/2022 1002   CREATININE 1.50 (H) 06/13/2021 1339   CALCIUM 9.0 02/24/2022 1002   GFRNONAA 32 (L) 02/24/2022 1002   GFRNONAA 37 (L) 06/13/2021 1339   GFRAA 44 (L) 02/28/2019 0252    BNP    Component Value Date/Time   BNP 34.2 09/03/2021 1026     Imaging:  No results found.  cyanocobalamin (VITAMIN B12) injection 1,000 mcg     Date Action Dose Route User   04/17/2022 1024 Given 1,000 mcg Intramuscular (Left Deltoid) [place]gg[place]Hernandez, [place], CMA      cyanocobalamin (VITAMIN B12) injection 1,000 mcg     Date Action Dose Route User   05/15/2022 0913 Given 1,000 mcg Intramuscular (Left Deltoid) Scruggs-Hernandez, [place], CMA           No data to display          No results found for: "NITRICOXIDE"      Assessment & Plan:   Intrinsic asthma Mild recent exacerbation related to allergies. Clinically improved and compensated on current regimen. Asthma action plan in place.  Patient Instructions  -Restart BiPAP auto 5-20, pressure support of 4 with backup rate of 10 for CO2 blow off and to reduce your risk of rehospitalization  Use for a minimum 6 hours a night, with goal of using entire night.  Change equipment every 30 days or as directed by DME.  Be aware of reduced alertness and do not drive or operate heavy machinery if experiencing this or drowsiness.  -Continue nighttime supplemental O2 therapy at 1L through BiPAP -Continue 2 lpm supplemental oxygen on POC with activity. Goal oxygen >88-90%. Notify  of increasing requirements.    -Continue Breztri 2 puffs Twice daily. Brush tongue and rinse mouth afterwards.  -Continue Pulmicort inhaler 2 puffs twice a day as needed for shortness of breath or wheezing  -Continue Ventolin inhaler 2 puffs as needed every 4 hours as needed for shortness of breath or wheezing  -Continue Singulair 10 mg At bedtime -Continue Flonase 2 sprays each nostril daily  -Continue Mucinex 600 mg Twice daily for chest congestion -Continue Claritin 10 mg daily for allergies  Follow up in 4 months with Dr. Wynona Neat or Philis Nettle. If symptoms do not improve or worsen, please contact office for sooner follow up or seek emergency care   Chronic respiratory failure with hypoxia and hypercapnia (HCC) We again discussed importance of compliance with BiPAP therapy and the role it plays in CO2 blow off. I encouraged her to resume therapy. She was agreeable to this. Reviewed the risks of hypercarbia. Will reach out to [facility] for download and reassess at her follow up  She had walk test today with desaturation to 87% on room air, recovered with 2 lpm supplemental O2, which is baseline for her and re-qualifies her for O2 therapy. She will continue to use 2 lpm with activity and 1 lpm with BiPAP at night. Goal >88-90%  Seasonal allergic rhinitis Improve and compensated on current regimen.     I spent 35 minutes of dedicated to the care of this patient on the date of this encounter to include pre-visit review of records, face-to-face time with the patient discussing conditions above, post visit ordering of testing, clinical documentation with the electronic health record, making appropriate referrals as documented, and communicating necessary findings to members of the patients care team.  Noemi Chapel, NP 06/03/2022  Pt aware and understands NP's role.

## 2022-06-03 NOTE — Telephone Encounter (Signed)
Script was sent in this morning.

## 2022-06-03 NOTE — Telephone Encounter (Signed)
Prescription Request  06/03/2022  LOV: 12/30/2021  What is the name of the medication or equipment? [email or web address]  Have you contacted your pharmacy to request a refill? Yes   Which pharmacy would you like this sent to? Patient notified that their request is being sent to the clinical staff for review and that they should receive a response within 2 business days.   Please advise at Mobile [phone] (mobile)

## 2022-06-05 NOTE — Telephone Encounter (Signed)
Pt called checking on status of diabetic shoe paperwork. She was told that something was missing and she is asking what we are missing so she can get her shoes.

## 2022-06-12 ENCOUNTER — Ambulatory Visit: Payer: Medicare PPO

## 2022-06-12 ENCOUNTER — Ambulatory Visit (INDEPENDENT_AMBULATORY_CARE_PROVIDER_SITE_OTHER): Payer: Medicare PPO

## 2022-06-12 DIAGNOSIS — E538 Deficiency of other specified B group vitamins: Secondary | ICD-10-CM | POA: Diagnosis not present

## 2022-06-12 MED ORDER — CYANOCOBALAMIN 1000 MCG/ML IJ SOLN
1000.0000 ug | Freq: Once | INTRAMUSCULAR | Status: AC
Start: 2022-06-12 — End: 2022-06-12
  Administered 2022-06-12: 1000 ug via INTRAMUSCULAR

## 2022-06-12 NOTE — Progress Notes (Signed)
Patient here for monthly B12 injection per Dr. Burns. B12 1000 mcg given in right IM and patient tolerated injection well today.  

## 2022-06-15 ENCOUNTER — Other Ambulatory Visit: Payer: Self-pay | Admitting: Internal Medicine

## 2022-06-15 ENCOUNTER — Telehealth: Payer: Self-pay | Admitting: Internal Medicine

## 2022-06-15 NOTE — Telephone Encounter (Signed)
Prescription Request  06/15/2022  LOV: 12/30/2021  What is the name of the medication or equipment? [email or web address]  Have you contacted your pharmacy to request a refill? Yes   Which pharmacy would you like this sent to?  [id number] - [place], [place] - [address] AT [address] [place] [address] Phone: [phone] Fax: [phone]     Patient notified that their request is being sent to the clinical staff for review and that they should receive a response within 2 business days.   Please advise at Mobile [phone] (mobile)

## 2022-06-16 DIAGNOSIS — J9601 Acute respiratory failure with hypoxia: Secondary | ICD-10-CM | POA: Diagnosis not present

## 2022-06-16 DIAGNOSIS — I5033 Acute on chronic diastolic (congestive) heart failure: Secondary | ICD-10-CM | POA: Diagnosis not present

## 2022-06-16 DIAGNOSIS — J9602 Acute respiratory failure with hypercapnia: Secondary | ICD-10-CM | POA: Diagnosis not present

## 2022-06-16 NOTE — Telephone Encounter (Signed)
Script sent in today  

## 2022-06-17 ENCOUNTER — Telehealth: Payer: Self-pay | Admitting: Internal Medicine

## 2022-06-17 ENCOUNTER — Other Ambulatory Visit: Payer: Self-pay

## 2022-06-17 DIAGNOSIS — E1140 Type 2 diabetes mellitus with diabetic neuropathy, unspecified: Secondary | ICD-10-CM

## 2022-06-17 MED ORDER — TIRZEPATIDE 15 MG/0.5ML ~~LOC~~ SOAJ
15.0000 mg | SUBCUTANEOUS | 2 refills | Status: DC
Start: 2022-06-17 — End: 2022-12-18

## 2022-06-17 NOTE — Telephone Encounter (Signed)
Script sent to day.

## 2022-06-17 NOTE — Telephone Encounter (Signed)
Patient called and said that she has found the [place] at [facility] on [address], [place], [address] [place]

## 2022-06-19 ENCOUNTER — Other Ambulatory Visit (HOSPITAL_COMMUNITY): Payer: Self-pay

## 2022-06-19 ENCOUNTER — Other Ambulatory Visit: Payer: Medicare PPO

## 2022-07-02 ENCOUNTER — Encounter: Payer: Self-pay | Admitting: Internal Medicine

## 2022-07-02 NOTE — Progress Notes (Signed)
Subjective:    Patient ID: April [name], female    DOB: 1950-12-04, 72 y.o.   MRN: [id number]     HPI April [name] is here for follow up of her chronic medical problems.  Stopped trileptal - does not have seizures and did not know why she was taking it  Having some increase in leg swelling - bumps in RLE  Medications and allergies reviewed with patient and updated if appropriate.  Current Outpatient Medications on File Prior to Visit  Medication Sig Dispense Refill   acetaminophen (TYLENOL) 650 MG CR tablet Take 1,300 mg by mouth every 8 (eight) hours as needed for pain.     Alcohol Swabs (DROPSAFE ALCOHOL PREP) 70 % PADS USE TOPICALLY AS DIRECTED 300 each 3   allopurinol (ZYLOPRIM) 100 MG tablet TAKE 2 TABLETS(200 MG) BY MOUTH DAILY 180 tablet 1   apixaban (ELIQUIS) 2.5 MG TABS tablet Take 1 tablet (2.5 mg total) by mouth 2 (two) times daily. 180 tablet 1   atorvastatin (LIPITOR) 40 MG tablet TAKE 1 TABLET(40 MG) BY MOUTH AT BEDTIME 90 tablet 2   Blood Glucose Calibration (TRUE METRIX LEVEL 1) Low SOLN UAD  E11.9 1 each 3   Blood Glucose Monitoring Suppl (TRUE METRIX METER) DEVI True Metrix Level 1 solution     Budeson-Glycopyrrol-Formoterol (BREZTRI AEROSPHERE) 160-9-4.8 MCG/ACT AERO Inhale 2 puffs into the lungs in the morning and at bedtime. 32.1 g 3   calcium carbonate (TUMS - DOSED IN MG ELEMENTAL CALCIUM) 500 MG chewable tablet Chew 2 tablets by mouth daily as needed for indigestion or heartburn.     cyanocobalamin (,VITAMIN B-12,) 1000 MCG/ML injection INJECT INTO MUSCLE EVERY 30 DAYS 10 mL 5   dapagliflozin propanediol (FARXIGA) 10 MG TABS tablet Take 10 mg by mouth daily.     diclofenac Sodium (VOLTAREN) 1 % GEL Apply 1 application topically as needed (pain).     diphenhydrAMINE (BENADRYL) 12.5 MG/5ML liquid Take 25 mg by mouth 4 (four) times daily as needed for allergies.     doxycycline (VIBRAMYCIN) 100 MG capsule Take 100 mg by mouth 2 (two) times daily.      eszopiclone (LUNESTA) 1 MG TABS tablet Take 1 mg by mouth at bedtime as needed for sleep. Take immediately before bedtime     famotidine (PEPCID) 20 MG tablet TAKE 1 TABLET(20 MG) BY MOUTH TWICE DAILY 180 tablet 3   FEROSUL 325 (65 Fe) MG tablet TAKE 1 TABLET BY MOUTH EVERY DAY ON MONDAY, WEDNESDAY, FRIDAYS 60 tablet 0   fluticasone (FLONASE) 50 MCG/ACT nasal spray Place 2 sprays into both nostrils daily. 18.2 mL 2   gabapentin (NEURONTIN) 100 MG capsule TAKE 1 CAPSULE(100 MG) BY MOUTH THREE TIMES DAILY 90 capsule 1   guaiFENesin-dextromethorphan (ROBITUSSIN DM) 100-10 MG/5ML syrup Take 5 mLs by mouth every 4 (four) hours as needed for cough.     hydrOXYzine (ATARAX) 25 MG tablet Take 1 tablet (25 mg total) by mouth 3 (three) times daily as needed for itching. 90 tablet 1   loratadine (CLARITIN) 10 MG tablet Take 1 tablet (10 mg total) by mouth daily. 30 tablet 5   montelukast (SINGULAIR) 10 MG tablet TAKE 1 TABLET(10 MG) BY MOUTH AT BEDTIME 30 tablet 5   nebivolol (BYSTOLIC) 5 MG tablet TAKE 1 TABLET(5 MG) BY MOUTH DAILY 30 tablet 3   ondansetron (ZOFRAN) 4 MG tablet Take 1 tablet (4 mg total) by mouth every 8 (eight) hours as needed for nausea or  vomiting. 20 tablet 0   oxyCODONE (OXY IR/ROXICODONE) 5 MG immediate release tablet Take 1 tablet (5 mg total) by mouth every 6 (six) hours as needed for severe pain (Chronic knee osteoarthritis). 30 tablet 0   polyethylene glycol (MIRALAX / GLYCOLAX) 17 g packet Take 17 g by mouth daily as needed for moderate constipation.     potassium chloride SA (KLOR-CON M) 20 MEQ tablet Take 1 tablet (20 mEq total) by mouth 2 (two) times daily. 180 tablet 3   Spacer/Aero-Holding [facility] Use with inhaler. 1 each 2   spironolactone (ALDACTONE) 25 MG tablet TAKE 1 TABLET(25 MG) BY MOUTH DAILY 90 tablet 0   tirzepatide (MOUNJARO) 15 MG/0.5ML Pen Inject 15 mg into the skin once a week. 6 mL 2   torsemide (DEMADEX) 20 MG tablet Take 40 mg in AM and 20 mg in PM 360  tablet 3   triamcinolone ointment (KENALOG) 0.5 % Apply 1 Application topically as needed (knee pain).     VENTOLIN HFA 108 (90 Base) MCG/ACT inhaler Inhale 2 puffs into the lungs every 4 (four) hours as needed for wheezing.     Oxcarbazepine (TRILEPTAL) 300 MG tablet Take 1 tablet (300 mg total) by mouth 2 (two) times daily. (Patient not taking: Reported on 07/03/2022) 180 tablet 1   No current facility-administered medications on file prior to visit.     Review of Systems  Constitutional:  Negative for fever.  Respiratory:  Positive for cough, shortness of breath (chronic - same) and wheezing.   Cardiovascular:  Positive for leg swelling. Negative for chest pain. Palpitations: occ. Neurological:  Positive for light-headedness (if she gets up too quick) and headaches (occ).       Objective:   Vitals:   07/03/22 0754  BP: 118/80  Pulse: 80  Temp: 98.9 F (37.2 C)  SpO2: 96%   BP Readings from Last 3 Encounters:  07/03/22 118/80  06/03/22 130/70  05/04/22 132/80   Wt Readings from Last 3 Encounters:  06/03/22 (!) 319 lb (144.7 kg)  05/04/22 (!) 330 lb 3.2 oz (149.8 kg)  12/30/21 (!) 325 lb (147.4 kg)   Body mass index is 58.35 kg/m.    Physical Exam Constitutional:      General: She is not in acute distress.    Appearance: Normal appearance.  HENT:     Head: Normocephalic and atraumatic.  Eyes:     Conjunctiva/sclera: Conjunctivae normal.  Cardiovascular:     Rate and Rhythm: Normal rate and regular rhythm.     Heart sounds: Normal heart sounds.  Pulmonary:     Effort: Pulmonary effort is normal. No respiratory distress.     Breath sounds: Normal breath sounds. No wheezing.  Musculoskeletal:     Cervical back: Neck supple.     Right lower leg: Edema (with chronic skin changes) present.     Left lower leg: Edema (with chronic skin changes) present.  Lymphadenopathy:     Cervical: No cervical adenopathy.  Skin:    General: Skin is warm and dry.     Findings:  No rash.  Neurological:     Mental Status: She is alert. Mental status is at baseline.  Psychiatric:        Mood and Affect: Mood normal.        Behavior: Behavior normal.        Lab Results  Component Value Date   WBC 5.0 02/24/2022   HGB 12.6 02/24/2022   HCT 40.0 02/24/2022  PLT 147 (L) 02/24/2022   GLUCOSE 90 02/24/2022   CHOL 165 12/30/2021   TRIG 99.0 12/30/2021   HDL 35.70 (L) 12/30/2021   LDLDIRECT 169.4 05/02/2007   LDLCALC 109 (H) 12/30/2021   ALT 11 12/30/2021   AST 16 12/30/2021   NA 138 02/24/2022   K 4.6 02/24/2022   CL 98 02/24/2022   CREATININE 1.70 (H) 02/24/2022   BUN 15 02/24/2022   CO2 33 (H) 02/24/2022   TSH 4.317 11/15/2020   INR 1.0 07/04/2006   HGBA1C 5.9 12/30/2021   MICROALBUR 1.8 08/29/2021     Assessment & Plan:    See Problem List for Assessment and Plan of chronic medical problems.

## 2022-07-02 NOTE — Patient Instructions (Addendum)
      Blood work was ordered.   The lab is on the first floor.    Medications changes include :   increase pepcid 40 twice a day.  Increase torsemide to 40 mg twice a day for 2 days       Return in about 6 months (around 01/02/2023) for Physical Exam. Will hold normal

## 2022-07-03 ENCOUNTER — Ambulatory Visit (INDEPENDENT_AMBULATORY_CARE_PROVIDER_SITE_OTHER): Payer: Medicare PPO | Admitting: Internal Medicine

## 2022-07-03 VITALS — BP 118/80 | HR 80 | Temp 98.9°F | Ht 62.0 in

## 2022-07-03 DIAGNOSIS — I10 Essential (primary) hypertension: Secondary | ICD-10-CM

## 2022-07-03 DIAGNOSIS — E1140 Type 2 diabetes mellitus with diabetic neuropathy, unspecified: Secondary | ICD-10-CM | POA: Diagnosis not present

## 2022-07-03 DIAGNOSIS — I5032 Chronic diastolic (congestive) heart failure: Secondary | ICD-10-CM | POA: Diagnosis not present

## 2022-07-03 DIAGNOSIS — E538 Deficiency of other specified B group vitamins: Secondary | ICD-10-CM | POA: Diagnosis not present

## 2022-07-03 DIAGNOSIS — M816 Localized osteoporosis [Lequesne]: Secondary | ICD-10-CM

## 2022-07-03 DIAGNOSIS — R600 Localized edema: Secondary | ICD-10-CM

## 2022-07-03 DIAGNOSIS — M170 Bilateral primary osteoarthritis of knee: Secondary | ICD-10-CM

## 2022-07-03 DIAGNOSIS — M109 Gout, unspecified: Secondary | ICD-10-CM | POA: Diagnosis not present

## 2022-07-03 DIAGNOSIS — K219 Gastro-esophageal reflux disease without esophagitis: Secondary | ICD-10-CM

## 2022-07-03 DIAGNOSIS — N1832 Chronic kidney disease, stage 3b: Secondary | ICD-10-CM

## 2022-07-03 DIAGNOSIS — Z86718 Personal history of other venous thrombosis and embolism: Secondary | ICD-10-CM

## 2022-07-03 DIAGNOSIS — G8929 Other chronic pain: Secondary | ICD-10-CM

## 2022-07-03 DIAGNOSIS — Z7985 Long-term (current) use of injectable non-insulin antidiabetic drugs: Secondary | ICD-10-CM

## 2022-07-03 DIAGNOSIS — E782 Mixed hyperlipidemia: Secondary | ICD-10-CM | POA: Diagnosis not present

## 2022-07-03 DIAGNOSIS — G609 Hereditary and idiopathic neuropathy, unspecified: Secondary | ICD-10-CM

## 2022-07-03 LAB — COMPREHENSIVE METABOLIC PANEL
ALT: 18 U/L (ref 0–35)
AST: 19 U/L (ref 0–37)
Albumin: 3.5 g/dL (ref 3.5–5.2)
Alkaline Phosphatase: 113 U/L (ref 39–117)
BUN: 30 mg/dL — ABNORMAL HIGH (ref 6–23)
CO2: 34 mEq/L — ABNORMAL HIGH (ref 19–32)
Calcium: 9.1 mg/dL (ref 8.4–10.5)
Chloride: 98 mEq/L (ref 96–112)
Creatinine, Ser: 1.68 mg/dL — ABNORMAL HIGH (ref 0.40–1.20)
GFR: 30.29 mL/min — ABNORMAL LOW (ref >60.00)
Glucose, Bld: 82 mg/dL (ref 70–99)
Potassium: 4.1 mEq/L (ref 3.5–5.1)
Sodium: 140 mEq/L (ref 135–145)
Total Bilirubin: 0.8 mg/dL (ref 0.2–1.2)
Total Protein: 6.6 g/dL (ref 6.0–8.3)

## 2022-07-03 LAB — CBC WITH DIFFERENTIAL/PLATELET
Basophils Absolute: 0.1 10*3/uL (ref 0.0–0.1)
Basophils Relative: 1.2 % (ref 0.0–3.0)
Eosinophils Absolute: 0.1 10*3/uL (ref 0.0–0.7)
Eosinophils Relative: 0.8 % (ref 0.0–5.0)
HCT: 40.6 % (ref 36.0–46.0)
Hemoglobin: 12.9 g/dL (ref 12.0–15.0)
Lymphocytes Relative: 19.9 % (ref 12.0–46.0)
Lymphs Abs: 1.3 10*3/uL (ref 0.7–4.0)
MCHC: 31.7 g/dL (ref 30.0–36.0)
MCV: 96.4 fl (ref 78.0–100.0)
Monocytes Absolute: 0.6 10*3/uL (ref 0.1–1.0)
Monocytes Relative: 8.7 % (ref 3.0–12.0)
Neutro Abs: 4.7 10*3/uL (ref 1.4–7.7)
Neutrophils Relative %: 69.4 % (ref 43.0–77.0)
Platelets: 157 10*3/uL (ref 150.0–400.0)
RBC: 4.21 Mil/uL (ref 3.87–5.11)
RDW: 15.8 % — ABNORMAL HIGH (ref 11.5–15.5)
WBC: 6.8 10*3/uL (ref 4.0–10.5)

## 2022-07-03 LAB — LIPID PANEL
Cholesterol: 127 mg/dL (ref 0–200)
HDL: 33 mg/dL — ABNORMAL LOW (ref >39.00)
LDL Cholesterol: 76 mg/dL (ref 0–99)
NonHDL: 94.34
Total CHOL/HDL Ratio: 4
Triglycerides: 91 mg/dL (ref 0.0–149.0)
VLDL: 18.2 mg/dL (ref 0.0–40.0)

## 2022-07-03 LAB — VITAMIN D 25 HYDROXY (VIT D DEFICIENCY, FRACTURES): VITD: 60.22 ng/mL (ref 30.00–100.00)

## 2022-07-03 LAB — HEMOGLOBIN A1C: Hgb A1c MFr Bld: 6 % (ref 4.6–6.5)

## 2022-07-03 LAB — URIC ACID: Uric Acid, Serum: 8 mg/dL — ABNORMAL HIGH (ref 2.4–7.0)

## 2022-07-03 LAB — VITAMIN B12: Vitamin B-12: 1150 pg/mL — ABNORMAL HIGH (ref 211–911)

## 2022-07-03 MED ORDER — FAMOTIDINE 40 MG PO TABS
40.0000 mg | ORAL_TABLET | Freq: Every day | ORAL | 3 refills | Status: DC
Start: 2022-07-03 — End: 2023-01-08

## 2022-07-03 NOTE — Assessment & Plan Note (Signed)
Chronic Blood pressure controlled Stressed low-sodium diet CMP Continue Bystolic 5 mg daily, spironolactone 25 mg daily, torsemide 40 mg 2 AM, 20 mg every afternoon

## 2022-07-03 NOTE — Assessment & Plan Note (Addendum)
Chronic Increased edema-having some changes in the skin in her right lower extremity especially related to the increased swelling Increase torsemide to 40 mg twice daily for 2 days to see if that helps bring on the fluid and then go back to torsemide 40 mg in the morning, 20 mg in afternoon Continue spironolactone 25 mg daily

## 2022-07-03 NOTE — Assessment & Plan Note (Signed)
Chronic Following with Dr. Signe Colt Continue April [name] 10 mg daily CBC, CMP Continue weight loss efforts

## 2022-07-03 NOTE — Assessment & Plan Note (Signed)
Chronic Has chronic bilateral knee osteoarthritis that is severe and peripheral neuropathy Taking oxycodone 5 mg every 6 hours as needed for severe pain only-she is taking medication appropriately and not taking on a regular basis Continue above 

## 2022-07-03 NOTE — Assessment & Plan Note (Addendum)
Chronic GERD not controlled Taking 6 tums some days increase famotidine to 40 mg twice daily

## 2022-07-03 NOTE — Assessment & Plan Note (Addendum)
Chronic She had stopped Trileptal because the pharmacy kept telling her it was for seizures and she knew she did not have seizures and was not sure why she was taking it-discussed reason and she does plan on restarting this Continue gabapentin 100 mg 3 times daily, Trileptal 300 mg twice daily

## 2022-07-03 NOTE — Assessment & Plan Note (Signed)
Chronic DEXA up-to-date  

## 2022-07-03 NOTE — Assessment & Plan Note (Signed)
History of DVT 05/2020 She is very sedentary and high risk of recurrence of DVT Continue to Eliquis 2.5 mg twice daily for prevention 

## 2022-07-03 NOTE — Assessment & Plan Note (Signed)
Chronic Bilateral severe knee osteoarthritis with chronic pain Continue oxycodone 5 mg every 6 hours as needed-she does not take this often 

## 2022-07-03 NOTE — Assessment & Plan Note (Signed)
Chronic Currently on Mounjaro She understands how important weight loss is for her medical problems Encourage as much activity as possible Healthy diet, small portions, low in sugars and carbs

## 2022-07-03 NOTE — Assessment & Plan Note (Signed)
Chronic Regular exercise and healthy diet encouraged Check lipid panel  Continue atorvastatin 40 mg daily 

## 2022-07-03 NOTE — Assessment & Plan Note (Signed)
Chronic Denies gout flares Continue allopurinol 200 mg daily Check uric acid level

## 2022-07-03 NOTE — Assessment & Plan Note (Addendum)
Chronic Continue monthly B12 injections Check B12 level

## 2022-07-03 NOTE — Assessment & Plan Note (Signed)
Chronic   Lab Results  Component Value Date   HGBA1C 5.9 12/30/2021   Sugars well controlled Check A1c Continue Farxiga 10 mg daily, Mounjaro 15 mg weekly Stressed regular exercise, diabetic diet

## 2022-07-03 NOTE — Assessment & Plan Note (Addendum)
Chronic Having increased leg swelling with some skin changes especially in the right lower extremity Increase torsemide to 40 mg twice daily for 2 days and then go back to torsemide 40 mg every morning, 20 mg every afternoon Continue spironolactone 25 mg daily CMP Stressed low-sodium diet

## 2022-07-08 ENCOUNTER — Ambulatory Visit (INDEPENDENT_AMBULATORY_CARE_PROVIDER_SITE_OTHER): Payer: Medicare PPO

## 2022-07-08 DIAGNOSIS — Z0000 Encounter for general adult medical examination without abnormal findings: Secondary | ICD-10-CM | POA: Diagnosis not present

## 2022-07-08 NOTE — Progress Notes (Signed)
Subjective:   April [name] is a 72 y.o. female who presents for Medicare Annual (Subsequent) preventive examination.  I connected with  April [name] on 07/08/22 by a audio enabled telemedicine application and verified that I am speaking with the correct person using two identifiers.  Patient Location: Home  Provider Location: Office/Clinic  I discussed the limitations of evaluation and management by telemedicine. The patient expressed understanding and agreed to proceed.  Review of Systems    Defer to PCP   Cardiac Risk Factors include: advanced age (>44men, >19 women);obesity (BMI >30kg/m2);diabetes mellitus;hypertension;sedentary lifestyle     Objective:    Today's Vitals   There is no height or weight on file to calculate BMI.     07/08/2022    9:03 AM 09/15/2021    2:12 PM 03/20/2021    7:05 AM 12/10/2020   10:42 AM 12/06/2020   11:18 PM 11/15/2020   12:48 PM 10/02/2020    9:29 AM  Advanced Directives  Does Patient Have a Medical Advance Directive? Yes Yes Yes Yes No No No  Type of Estate agent of [place];Living will  Healthcare Power of [place];Living will      Does patient want to make changes to medical advance directive?  No - Patient declined       Copy of Healthcare Power of Attorney in Chart?   Yes - validated most recent copy scanned in chart (See row information)      Would patient like information on creating a medical advance directive?     No - Patient declined No - Guardian declined No - Patient declined    Current Medications (verified) Outpatient Encounter Medications as of 07/08/2022  Medication Sig   acetaminophen (TYLENOL) 650 MG CR tablet Take 1,300 mg by mouth every 8 (eight) hours as needed for pain.   Alcohol Swabs (DROPSAFE ALCOHOL PREP) 70 % PADS USE TOPICALLY AS DIRECTED   allopurinol (ZYLOPRIM) 100 MG tablet TAKE 2 TABLETS(200 MG) BY MOUTH DAILY   apixaban (ELIQUIS) 2.5 MG TABS tablet Take 1 tablet (2.5 mg  total) by mouth 2 (two) times daily.   atorvastatin (LIPITOR) 40 MG tablet TAKE 1 TABLET(40 MG) BY MOUTH AT BEDTIME   Blood Glucose Calibration (TRUE METRIX LEVEL 1) Low SOLN UAD  E11.9   Blood Glucose Monitoring Suppl (TRUE METRIX METER) DEVI True Metrix Level 1 solution   Budeson-Glycopyrrol-Formoterol (BREZTRI AEROSPHERE) 160-9-4.8 MCG/ACT AERO Inhale 2 puffs into the lungs in the morning and at bedtime.   calcium carbonate (TUMS - DOSED IN MG ELEMENTAL CALCIUM) 500 MG chewable tablet Chew 2 tablets by mouth daily as needed for indigestion or heartburn.   cyanocobalamin (,VITAMIN B-12,) 1000 MCG/ML injection INJECT INTO MUSCLE EVERY 30 DAYS   dapagliflozin propanediol (FARXIGA) 10 MG TABS tablet Take 10 mg by mouth daily.   diclofenac Sodium (VOLTAREN) 1 % GEL Apply 1 application topically as needed (pain).   diphenhydrAMINE (BENADRYL) 12.5 MG/5ML liquid Take 25 mg by mouth 4 (four) times daily as needed for allergies.   doxycycline (VIBRAMYCIN) 100 MG capsule Take 100 mg by mouth 2 (two) times daily.   eszopiclone (LUNESTA) 1 MG TABS tablet Take 1 mg by mouth at bedtime as needed for sleep. Take immediately before bedtime   famotidine (PEPCID) 40 MG tablet Take 1 tablet (40 mg total) by mouth daily.   FEROSUL 325 (65 Fe) MG tablet TAKE 1 TABLET BY MOUTH EVERY DAY ON MONDAY, WEDNESDAY, FRIDAYS   fluticasone (FLONASE) 50  MCG/ACT nasal spray Place 2 sprays into both nostrils daily.   gabapentin (NEURONTIN) 100 MG capsule TAKE 1 CAPSULE(100 MG) BY MOUTH THREE TIMES DAILY   guaiFENesin-dextromethorphan (ROBITUSSIN DM) 100-10 MG/5ML syrup Take 5 mLs by mouth every 4 (four) hours as needed for cough.   hydrOXYzine (ATARAX) 25 MG tablet Take 1 tablet (25 mg total) by mouth 3 (three) times daily as needed for itching.   loratadine (CLARITIN) 10 MG tablet Take 1 tablet (10 mg total) by mouth daily.   montelukast (SINGULAIR) 10 MG tablet TAKE 1 TABLET(10 MG) BY MOUTH AT BEDTIME   nebivolol  (BYSTOLIC) 5 MG tablet TAKE 1 TABLET(5 MG) BY MOUTH DAILY   ondansetron (ZOFRAN) 4 MG tablet Take 1 tablet (4 mg total) by mouth every 8 (eight) hours as needed for nausea or vomiting.   Oxcarbazepine (TRILEPTAL) 300 MG tablet Take 1 tablet (300 mg total) by mouth 2 (two) times daily.   oxyCODONE (OXY IR/ROXICODONE) 5 MG immediate release tablet Take 1 tablet (5 mg total) by mouth every 6 (six) hours as needed for severe pain (Chronic knee osteoarthritis).   polyethylene glycol (MIRALAX / GLYCOLAX) 17 g packet Take 17 g by mouth daily as needed for moderate constipation.   potassium chloride SA (KLOR-CON M) 20 MEQ tablet Take 1 tablet (20 mEq total) by mouth 2 (two) times daily.   Spacer/Aero-Holding Rudean Curt Use with inhaler.   spironolactone (ALDACTONE) 25 MG tablet TAKE 1 TABLET(25 MG) BY MOUTH DAILY   tirzepatide (MOUNJARO) 15 MG/0.5ML Pen Inject 15 mg into the skin once a week.   torsemide (DEMADEX) 20 MG tablet Take 40 mg in AM and 20 mg in PM   triamcinolone ointment (KENALOG) 0.5 % Apply 1 Application topically as needed (knee pain).   VENTOLIN HFA 108 (90 Base) MCG/ACT inhaler Inhale 2 puffs into the lungs every 4 (four) hours as needed for wheezing.   No facility-administered encounter medications on file as of 07/08/2022.    Allergies (verified) Penicillins, Shellfish allergy, Sulfonamide derivatives, Hydrochlorothiazide w-triamterene, Lotensin [benazepril hcl], Dipyridamole, Estrogens, Hydrochlorothiazide, Latex, Metronidazole, Other, Spironolactone, Torsemide, Valsartan, and Mustard [allyl isothiocyanate]   History: Past Medical History:  Diagnosis Date   Anemia    Asthma    CAD (coronary artery disease)    Carpal tunnel syndrome    Cellulitis of both lower extremities 04/11/2015   CHF (congestive heart failure) (HCC)    Chronic kidney disease    [facility]- followed by [facility]   Colon polyp, hyperplastic 2007 & 2012   Complication of anesthesia 1999   svt with renal  calculi surgery, no problems since   CTS (carpal tunnel syndrome)    bilateral   Diabetes mellitus    Eczema    GERD (gastroesophageal reflux disease)    History of kidney stones 1999   Hyperlipidemia    Hypertension    Leg ulcer (HCC) 04/24/2015   Right lateral leg No evidence of an infection Monitor closely Keep edema controlled    Leg ulcer (HCC)    right lower   Meralgia paresthetica    Dr. Terrace Arabia   Morbid obesity (HCC)    Neuropathy    toes and legs   Osteoarthrosis, unspecified whether generalized or localized, lower leg    knee   PUD (peptic ulcer disease)    Shortness of breath dyspnea    with exertion   Sleep apnea    per progress note 02/25/2018   Type II or unspecified type diabetes mellitus without mention  of complication, not stated as uncontrolled    Unspecified hereditary and idiopathic peripheral neuropathy    Urticaria    Vitamin B12 deficiency    Wears glasses    Wound cellulitis    right upper leg, healing well   Past Surgical History:  Procedure Laterality Date   ABDOMINAL HYSTERECTOMY     BIOPSY  03/20/2021   Procedure: BIOPSY;  Surgeon: Beverley Fiedler, MD;  Location: [facility];  Service: Gastroenterology;;   BREAST BIOPSY Right 07/08/2018   CARDIAC CATHETERIZATION  2002   non obstructive disease   colonoscopy with polypectomy  2007 & 2012    hyperplastic ;Dr Ewing Schlein   COLONOSCOPY WITH PROPOFOL N/A 06/04/2015   Procedure: COLONOSCOPY WITH PROPOFOL;  Surgeon: Beverley Fiedler, MD;  Location: [facility];  Service: Gastroenterology;  Laterality: N/A;   COLONOSCOPY WITH PROPOFOL N/A 03/20/2021   Procedure: COLONOSCOPY WITH PROPOFOL;  Surgeon: Beverley Fiedler, MD;  Location: [facility];  Service: Gastroenterology;  Laterality: N/A;   DEBRIDEMENT LEG Right 03/02/2018   WOUND VAC APPLIED   DEBRIDEMENT LEG Right 05/27/2018   RIGHT LOWER LEG DEBRIDEMENT,  SKIN GRAFT, VAC PLACEMENT    DILATION AND CURETTAGE OF UTERUS     multiple   ESOPHAGOGASTRODUODENOSCOPY  (EGD) WITH PROPOFOL N/A 03/20/2021   Procedure: ESOPHAGOGASTRODUODENOSCOPY (EGD) WITH PROPOFOL;  Surgeon: Beverley Fiedler, MD;  Location: [facility];  Service: Gastroenterology;  Laterality: N/A;   HEMORRHOID SURGERY     HEMORROIDECTOMY     I & D EXTREMITY Right 03/02/2018   Procedure: RIGHT LEG DEBRIDEMENT AND PLACE VAC;  Surgeon: Nadara Mustard, MD;  Location: [facility];  Service: Orthopedics;  Laterality: Right;   I & D EXTREMITY Right 03/04/2018   Procedure: REPEAT IRRIGATION AND DEBRIDEMENT RIGHT LEG, PLACE WOUND VAC;  Surgeon: Nadara Mustard, MD;  Location: [facility];  Service: Orthopedics;  Laterality: Right;   I & D EXTREMITY Right 03/30/2018   Procedure: IRRIGATION AND DEBRIDEMENT RIGHT LEG, APPLY WOUND VAC;  Surgeon: Nadara Mustard, MD;  Location: [facility];  Service: Orthopedics;  Laterality: Right;   I & D EXTREMITY Right 05/27/2018   Procedure: RIGHT LOWER LEG DEBRIDEMENT,  SKIN GRAFT, VAC PLACEMENT;  Surgeon: Nadara Mustard, MD;  Location: [facility];  Service: Orthopedics;  Laterality: Right;  RIGHT LOWER LEG DEBRIDEMENT,  SKIN GRAFT, VAC PLACEMENT   POLYPECTOMY  03/20/2021   Procedure: POLYPECTOMY;  Surgeon: Beverley Fiedler, MD;  Location: [facility];  Service: Gastroenterology;;   renal calculi  12/1997   SVT with induction of anesthesia   RIGHT HEART CATH N/A 11/19/2020   Procedure: RIGHT HEART CATH;  Surgeon: Dolores Patty, MD;  Location: [facility];  Service: Cardiovascular;  Laterality: N/A;   right knee arthroscopy     SKIN SPLIT GRAFT Right 03/04/2018   Procedure: POSSIBLE SPLIT THICKNESS SKIN GRAFT;  Surgeon: Nadara Mustard, MD;  Location: [facility];  Service: Orthopedics;  Laterality: Right;   SKIN SPLIT GRAFT Right 04/01/2018   Procedure: REPEAT IRRIGATION AND DEBRIDEMENT RIGHT LEG, APPLY SPLIT THICKNESS SKIN GRAFT;  Surgeon: Nadara Mustard, MD;  Location: [facility];  Service: Orthopedics;  Laterality: Right;   TONSILLECTOMY AND ADENOIDECTOMY     Family History  Problem  Relation Age of Onset   Colon cancer Mother    Prostate cancer Father    Colon cancer Father    Diabetes Maternal Aunt    Breast cancer Maternal Aunt    Diabetes Maternal Uncle  Diabetes Paternal Aunt    Stroke Paternal Aunt        > 65   Heart disease Paternal Aunt    Diabetes Paternal Uncle    Breast cancer Maternal Aunt         X 2   Breast cancer Cousin    Social History   Socioeconomic History   Marital status: Married    Spouse name: Dwight   Number of children: 1   Years of education: BS   Highest education level: Not on file  Occupational History   Occupation: Disabled    Employer: RETIRED  Tobacco Use   Smoking status: Never   Smokeless tobacco: Never  Vaping Use   Vaping Use: Never used  Substance and Sexual Activity   Alcohol use: No    Alcohol/week: 0.0 standard drinks of alcohol   Drug use: No   Sexual activity: Not Currently  Other Topics Concern   Not on file  Social History Narrative   Patient lives at home with her husband Karren Burly) . Patient is retired and has a Lawyer.    Caffeine - some times.   Right handed.   Social Determinants of Health   Financial Resource Strain: Low Risk  (07/08/2022)   Overall Financial Resource Strain (CARDIA)    Difficulty of Paying Living Expenses: Not hard at all  Food Insecurity: No Food Insecurity (07/08/2022)   Hunger Vital Sign    Worried About Running Out of Food in the Last Year: Never true    Ran Out of Food in the Last Year: Never true  Transportation Needs: No Transportation Needs (07/08/2022)   PRAPARE - Administrator, Civil Service (Medical): No    Lack of Transportation (Non-Medical): No  Physical Activity: Inactive (07/08/2022)   Exercise Vital Sign    Days of Exercise per Week: 0 days    Minutes of Exercise per Session: 0 min  Stress: No Stress Concern Present (07/08/2022)   [employer] of Occupational Health - Occupational Stress Questionnaire    Feeling of Stress :  Only a little  Social Connections: Socially Integrated (07/08/2022)   Social Connection and Isolation Panel [NHANES]    Frequency of Communication with Friends and Family: More than three times a week    Frequency of Social Gatherings with Friends and Family: Once a week    Attends Religious Services: More than 4 times per year    Active Member of [employer] or Organizations: Yes    Attends Banker Meetings: 1 to 4 times per year    Marital Status: Married    Tobacco Counseling Counseling given: Not Answered   Clinical Intake:  Pre-visit preparation completed: Yes  Pain : No/denies pain     Nutritional Status: BMI > 30  Obese Nutritional Risks: Non-healing wound Diabetes: Yes CBG done?: Yes CBG resulted in Enter/ Edit results?: No Did pt. bring in CBG monitor from home?: No (telephone visit)  How often do you need to have someone help you when you read instructions, pamphlets, or other written materials from your doctor or pharmacy?: 2 - Rarely What is the last grade level you completed in school?: bachelor's degree/ some masters classes  Diabetic?Nutrition Risk Assessment:  Has the patient had any N/V/D within the last 2 months?  Yes  Does the patient have any non-healing wounds?  Yes  Has the patient had any unintentional weight loss or weight gain?  No   Diabetes:  Is the patient  diabetic?  Yes  If diabetic, was a CBG obtained today?  No  Did the patient bring in their glucometer from home?   N/A How often do you monitor your CBG's? Every other day.   Financial Strains and Diabetes Management:  Are you having any financial strains with the device, your supplies or your medication? No .  Does the patient want to be seen by Chronic Care Management for management of their diabetes?  No  Would the patient like to be referred to a Nutritionist or for Diabetic Management?  No   Diabetic Exams:  Diabetic Eye Exam: Completed 01/08/2022 Diabetic Foot Exam:  Completed 04/17/2022   Interpreter Needed?: No      Activities of Daily Living    09/15/2021    2:13 PM  In your present state of health, do you have any difficulty performing the following activities:  Hearing? 0  Vision? 0  Difficulty concentrating or making decisions? 1  Walking or climbing stairs? 1  Dressing or bathing? 0  Doing errands, shopping? 0  Preparing Food and eating ? N  Using the Toilet? N  In the past six months, have you accidently leaked urine? N  Do you have problems with loss of bowel control? N  Managing your Medications? N  Managing your Finances? N  Housekeeping or managing your Housekeeping? N    Patient Care Team: Pincus Sanes, MD as PCP - General (Internal Medicine)  Indicate any recent Medical Services you may have received from other than [facility] providers in the past year (date may be approximate).     Assessment:   This is a routine wellness examination for [place].  Hearing/Vision screen Vision Screening - Comments:: See provider every six month   Dietary issues and exercise activities discussed: Current Exercise Habits: Home exercise routine, Type of exercise: Other - see comments (patient does chair excercise), Time (Minutes): 30, Frequency (Times/Week): 7, Weekly Exercise (Minutes/Week): 210, Intensity: Mild, Exercise limited by: Other - see comments (Patient is in a wheelchait, not as mobile)   Goals Addressed               This Visit's Progress     Patient Stated (pt-stated)        Patient stated she want to be able to improve her mobility, to be able to walk more than she currently is        Depression Screen    07/08/2022    9:00 AM 07/08/2022    8:38 AM 12/30/2021   10:04 AM 12/30/2021    9:47 AM 09/15/2021    2:07 PM 08/29/2021    1:52 PM 07/15/2018    9:47 AM  PHQ 2/9 Scores  PHQ - 2 Score 0 0 0 0 0 0 0  PHQ- 9 Score      0     Fall Risk    07/08/2022    8:38 AM 12/30/2021   10:04 AM 12/30/2021    9:47 AM  09/15/2021    2:12 PM 08/29/2021    1:53 PM  Fall Risk   Falls in the past year? 1 0 0 1 0  Number falls in past yr: 0 0 0 1 0  Injury with Fall? 0 0 0 1 0  Risk for fall due to : History of fall(s) No Fall Risks No Fall Risks History of fall(s);Impaired mobility No Fall Risks  Follow up Falls evaluation completed Falls evaluation completed Falls evaluation completed Falls evaluation completed Falls evaluation completed  FALL RISK PREVENTION PERTAINING TO THE HOME:  Any stairs in or around the home? Yes  If so, are there any without handrails? Yes  Home free of loose throw rugs in walkways, pet beds, electrical cords, etc? Yes  Adequate lighting in your home to reduce risk of falls? Yes   ASSISTIVE DEVICES UTILIZED TO PREVENT FALLS:  Life alert? No  Use of a cane, walker or w/c? Yes  Grab bars in the bathroom?  N/A pt does not use the bathroom. Pt uses a basin Shower chair or bench in shower?  N/A Elevated toilet seat or a handicapped toilet? Yes   TIMED UP AND GO:  Was the test performed? No .  Length of time to ambulate 10 feet: N/A sec.    Cognitive Function:        07/08/2022    8:46 AM 09/15/2021    2:15 PM  6CIT Screen  What Year? 0 points 0 points  What month? 0 points 0 points  What time? 0 points 0 points  Count back from 20 0 points 0 points  Months in reverse 0 points 0 points  Repeat phrase 0 points 0 points  Total Score 0 points 0 points    Immunizations Immunization History  Administered Date(s) Administered   Fluad Quad(high Dose 65+) 10/28/2018, 10/18/2020, 11/07/2021   Influenza Split 12/03/2010, 11/03/2011   Influenza Whole 11/16/2006, 11/22/2007, 10/31/2008, 10/29/2009   Influenza, High Dose Seasonal PF 10/10/2015, 11/06/2016, 11/16/2017, 11/08/2019   Influenza,inj,Quad PF,6+ Mos 10/19/2012, 10/25/2013, 10/29/2014   Influenza-Unspecified 10/23/2016, 11/18/2016   Moderna Sars-Covid-2 Vaccination 03/13/2019, 04/10/2019, 12/06/2019   PFIZER  Comirnaty(Gray Top)Covid-19 Tri-Sucrose Vaccine 11/14/2021   Pfizer Covid-19 Vaccine Bivalent Booster 10yrs & up 11/08/2020   Pneumococcal Conjugate-13 06/07/2015   Pneumococcal Polysaccharide-23 03/24/2005, 01/04/2012, 06/29/2017   Td 06/24/2009   Tdap 10/09/2014   Zoster Recombinat (Shingrix) 08/11/2016, 10/23/2016   Zoster, Live 01/15/2012    TDAP status: Up to date  Flu Vaccine status: Up to date  Pneumococcal vaccine status: Up to date  Covid-19 vaccine status: Completed vaccines  Qualifies for Shingles Vaccine? Yes   Zostavax completed Yes   Shingrix Completed?: Yes  Screening Tests Health Maintenance  Topic Date Due   COVID-19 Vaccine (6 - 2023-24 season) 07/19/2022 (Originally 01/09/2022)   INFLUENZA VACCINE  09/03/2022   HEMOGLOBIN A1C  01/02/2023   OPHTHALMOLOGY EXAM  01/09/2023   FOOT EXAM  04/17/2023   Diabetic kidney evaluation - Urine ACR  05/18/2023   Diabetic kidney evaluation - eGFR measurement  07/03/2023   Medicare Annual Wellness (AWV)  07/08/2023   MAMMOGRAM  08/02/2023   DEXA SCAN  08/01/2024   DTaP/Tdap/Td (3 - Td or Tdap) 10/08/2024   Colonoscopy  03/21/2031   Pneumonia Vaccine 19+ Years old  Completed   Hepatitis C Screening  Completed   HPV VACCINES  Aged Out   Zoster Vaccines- Shingrix  Discontinued    Health Maintenance  There are no preventive care reminders to display for this patient.  Colorectal cancer screening: Type of screening: Colonoscopy. Completed 03/20/21. Repeat every 10 years  Mammogram status: Completed 08/01/21. Repeat every year  Bone Density status: Completed 08/01/21. Results reflect: Bone density results: OSTEOPOROSIS. Repeat every 3 years.  Lung Cancer Screening: (Low Dose CT Chest recommended if Age 63-80 years, 30 pack-year currently smoking OR have quit w/in 15years.) does not qualify.   Lung Cancer Screening Referral: N/A  Additional Screening:  Hepatitis C Screening: does qualify; Completed  12/31/20  Vision Screening: Recommended  annual ophthalmology exams for early detection of glaucoma and other disorders of the eye. Is the patient up to date with their annual eye exam?  Yes  Who is the provider or what is the name of the office in which the patient attends annual eye exams? Jeni Salles MD If pt is not established with a provider, would they like to be referred to a provider to establish care? No .   Dental Screening: Recommended annual dental exams for proper oral hygiene  Community Resource Referral / Chronic Care Management: CRR required this visit?  No   CCM required this visit?  No      Plan:     I have personally reviewed and noted the following in the patient's chart:   Medical and social history Use of alcohol, tobacco or illicit drugs  Current medications and supplements including opioid prescriptions. Patient is currently taking opioid prescriptions. Information provided to patient regarding non-opioid alternatives. Patient advised to discuss non-opioid treatment plan with their provider. Functional ability and status Nutritional status Physical activity Advanced directives List of other physicians Hospitalizations, surgeries, and ER visits in previous 12 months Vitals Screenings to include cognitive, depression, and falls Referrals and appointments  In addition, I have reviewed and discussed with patient certain preventive protocols, quality metrics, and best practice recommendations. A written personalized care plan for preventive services as well as general preventive health recommendations were provided to patient.     Young Berry Meenakshi Sazama, CMA   07/08/2022   Non face to face 20 minutes    Nurse Notes:  Ms. Schimanski , Thank you for taking time to come for your Medicare Wellness Visit. I appreciate your ongoing commitment to your health goals. Please review the following plan we discussed and let me know if I can assist you in the future.   These  are the goals we discussed:  Goals       Patient Stated (pt-stated)      Patient states she would like to work on walking or standing for longer.       Patient Stated (pt-stated)      Patient stated she want to be able to improve her mobility, to be able to walk more than she currently is         This is a list of the screening recommended for you and due dates:  Health Maintenance  Topic Date Due   COVID-19 Vaccine (6 - 2023-24 season) 07/19/2022*   Flu Shot  09/03/2022   Hemoglobin A1C  01/02/2023   Eye exam for diabetics  01/09/2023   Complete foot exam   04/17/2023   Yearly kidney health urinalysis for diabetes  05/18/2023   Yearly kidney function blood test for diabetes  07/03/2023   Medicare Annual Wellness Visit  07/08/2023   Mammogram  08/02/2023   DEXA scan (bone density measurement)  08/01/2024   DTaP/Tdap/Td vaccine (3 - Td or Tdap) 10/08/2024   Colon Cancer Screening  03/21/2031   Pneumonia Vaccine  Completed   Hepatitis C Screening  Completed   HPV Vaccine  Aged Out   Zoster (Shingles) Vaccine  Discontinued  *Topic was postponed. The date shown is not the original due date.

## 2022-07-08 NOTE — Patient Instructions (Signed)

## 2022-07-13 ENCOUNTER — Other Ambulatory Visit: Payer: Self-pay

## 2022-07-13 ENCOUNTER — Ambulatory Visit (INDEPENDENT_AMBULATORY_CARE_PROVIDER_SITE_OTHER): Payer: Medicare PPO | Admitting: Podiatry

## 2022-07-13 DIAGNOSIS — L84 Corns and callosities: Secondary | ICD-10-CM

## 2022-07-13 DIAGNOSIS — E1142 Type 2 diabetes mellitus with diabetic polyneuropathy: Secondary | ICD-10-CM | POA: Diagnosis not present

## 2022-07-13 DIAGNOSIS — M2142 Flat foot [pes planus] (acquired), left foot: Secondary | ICD-10-CM | POA: Diagnosis not present

## 2022-07-13 DIAGNOSIS — M2141 Flat foot [pes planus] (acquired), right foot: Secondary | ICD-10-CM

## 2022-07-13 NOTE — Progress Notes (Signed)
Patient presents today to pick up diabetic shoes and insoles.  Patient was dispensed 1 pair of diabetic shoes and 3 pairs of foam casted diabetic insoles. She tried on the shoes with the insoles and the fit was satisfactory. She stated the left shoe felt a little loose in the heel area.  I explained to her that one foot is usually bigger than the other.  I tightened her strings up on that foot and she stated it felt better.  Wearing instructions were given as well as Medicare Guidelines.  ABN was signed.

## 2022-07-15 ENCOUNTER — Ambulatory Visit (INDEPENDENT_AMBULATORY_CARE_PROVIDER_SITE_OTHER): Payer: Medicare PPO | Admitting: *Deleted

## 2022-07-15 ENCOUNTER — Ambulatory Visit: Payer: Medicare PPO

## 2022-07-15 DIAGNOSIS — E538 Deficiency of other specified B group vitamins: Secondary | ICD-10-CM

## 2022-07-15 MED ORDER — CYANOCOBALAMIN 1000 MCG/ML IJ SOLN
1000.0000 ug | Freq: Once | INTRAMUSCULAR | Status: AC
Start: 2022-07-15 — End: 2022-07-15
  Administered 2022-07-15: 1000 ug via INTRAMUSCULAR

## 2022-07-15 NOTE — Progress Notes (Signed)
Pls cosign for B12 inj../lmb  

## 2022-07-17 DIAGNOSIS — J9602 Acute respiratory failure with hypercapnia: Secondary | ICD-10-CM | POA: Diagnosis not present

## 2022-07-17 DIAGNOSIS — J9601 Acute respiratory failure with hypoxia: Secondary | ICD-10-CM | POA: Diagnosis not present

## 2022-07-17 DIAGNOSIS — I5033 Acute on chronic diastolic (congestive) heart failure: Secondary | ICD-10-CM | POA: Diagnosis not present

## 2022-07-24 ENCOUNTER — Ambulatory Visit (INDEPENDENT_AMBULATORY_CARE_PROVIDER_SITE_OTHER): Payer: Medicare PPO | Admitting: Podiatry

## 2022-07-24 DIAGNOSIS — B351 Tinea unguium: Secondary | ICD-10-CM

## 2022-07-24 DIAGNOSIS — M79675 Pain in left toe(s): Secondary | ICD-10-CM

## 2022-07-24 DIAGNOSIS — M79674 Pain in right toe(s): Secondary | ICD-10-CM | POA: Diagnosis not present

## 2022-07-24 DIAGNOSIS — Z7901 Long term (current) use of anticoagulants: Secondary | ICD-10-CM

## 2022-07-25 NOTE — Progress Notes (Signed)
Subjective: Chief Complaint  Patient presents with   RFC     3 months Foot Care      72 y.o. returns the office today for painful, elongated, thickened toenails which she cannot trim herself. Denies any redness or drainage around the nails.  No new pedal complaints today.  On Eliquis   PCP: Pincus Sanes, MD Last Seen: Jul 03, 2022  A1c: 6 on Jul 03, 2022  Objective: AAO 3, NAD- in wheelchair DP/PT pulses decreased- feet are warm and perfused.  Chronic lower extremity edema present.  Sensation decreased as well as to monofilament. Nails hypertrophic, dystrophic, elongated, brittle, discolored 10. There is tenderness overlying the nails 1-5 bilaterally. There is no surrounding erythema or drainage along the nail sites. No open lesions or pre-ulcerative lesions are identified. No pain with calf compression, swelling, warmth, erythema.  Assessment: Patient presents with symptomatic onychomycosis  Plan: -Treatment options including alternatives, risks, complications were discussed -Nails sharply debrided 10 without complication/bleeding. -Discussed daily foot inspection. If there are any changes, to call the office immediately.  -Follow-up in 3 months or sooner if any problems are to arise. In the meantime, encouraged to call the office with any questions, concerns, changes symptoms.  Ovid Curd, DPM

## 2022-07-27 ENCOUNTER — Other Ambulatory Visit: Payer: Self-pay

## 2022-07-27 ENCOUNTER — Other Ambulatory Visit (HOSPITAL_COMMUNITY): Payer: Self-pay

## 2022-07-29 ENCOUNTER — Other Ambulatory Visit (HOSPITAL_COMMUNITY): Payer: Self-pay

## 2022-08-03 ENCOUNTER — Other Ambulatory Visit: Payer: Self-pay | Admitting: Internal Medicine

## 2022-08-04 ENCOUNTER — Other Ambulatory Visit (HOSPITAL_COMMUNITY): Payer: Self-pay

## 2022-08-07 ENCOUNTER — Ambulatory Visit: Payer: Medicare PPO

## 2022-08-07 ENCOUNTER — Ambulatory Visit: Payer: Medicare PPO | Admitting: Physician Assistant

## 2022-08-07 ENCOUNTER — Other Ambulatory Visit: Payer: Medicare PPO

## 2022-08-10 ENCOUNTER — Other Ambulatory Visit: Payer: Self-pay | Admitting: Internal Medicine

## 2022-08-11 ENCOUNTER — Other Ambulatory Visit: Payer: Self-pay | Admitting: Physician Assistant

## 2022-08-11 DIAGNOSIS — Z86718 Personal history of other venous thrombosis and embolism: Secondary | ICD-10-CM

## 2022-08-12 ENCOUNTER — Ambulatory Visit (HOSPITAL_BASED_OUTPATIENT_CLINIC_OR_DEPARTMENT_OTHER)
Admission: RE | Admit: 2022-08-12 | Discharge: 2022-08-12 | Disposition: A | Discharge status: Home / Self Care | Payer: Medicare PPO | Source: Ambulatory Visit | Attending: Physician Assistant | Admitting: Physician Assistant

## 2022-08-12 ENCOUNTER — Other Ambulatory Visit: Payer: Self-pay

## 2022-08-12 ENCOUNTER — Inpatient Hospital Stay: Payer: Medicare PPO | Attending: Physician Assistant

## 2022-08-12 ENCOUNTER — Inpatient Hospital Stay: Payer: Medicare PPO | Admitting: Physician Assistant

## 2022-08-12 VITALS — BP 123/82 | HR 89 | Temp 97.7°F | Resp 18

## 2022-08-12 DIAGNOSIS — Z7901 Long term (current) use of anticoagulants: Secondary | ICD-10-CM | POA: Insufficient documentation

## 2022-08-12 DIAGNOSIS — Z86718 Personal history of other venous thrombosis and embolism: Secondary | ICD-10-CM | POA: Insufficient documentation

## 2022-08-12 DIAGNOSIS — R600 Localized edema: Secondary | ICD-10-CM

## 2022-08-12 LAB — CBC WITH DIFFERENTIAL (CANCER CENTER ONLY)
Abs Immature Granulocytes: 0.03 10*3/uL (ref 0.00–0.07)
Basophils Absolute: 0.1 10*3/uL (ref 0.0–0.1)
Basophils Relative: 2 %
Eosinophils Absolute: 0.1 10*3/uL (ref 0.0–0.5)
Eosinophils Relative: 1 %
HCT: 41.4 % (ref 36.0–46.0)
Hemoglobin: 12.8 g/dL (ref 12.0–15.0)
Immature Granulocytes: 1 %
Lymphocytes Relative: 23 %
Lymphs Abs: 1.2 10*3/uL (ref 0.7–4.0)
MCH: 31.1 pg (ref 26.0–34.0)
MCHC: 30.9 g/dL (ref 30.0–36.0)
MCV: 100.5 fL — ABNORMAL HIGH (ref 80.0–100.0)
Monocytes Absolute: 0.4 10*3/uL (ref 0.1–1.0)
Monocytes Relative: 7 %
Neutro Abs: 3.4 10*3/uL (ref 1.7–7.7)
Neutrophils Relative %: 66 %
Platelet Count: 157 10*3/uL (ref 150–400)
RBC: 4.12 MIL/uL (ref 3.87–5.11)
RDW: 15.1 % (ref 11.5–15.5)
WBC Count: 5.1 10*3/uL (ref 4.0–10.5)
nRBC: 0.4 % — ABNORMAL HIGH (ref 0.0–0.2)

## 2022-08-12 LAB — CMP (CANCER CENTER ONLY)
ALT: 11 U/L (ref 0–44)
AST: 14 U/L — ABNORMAL LOW (ref 15–41)
Albumin: 3.6 g/dL (ref 3.5–5.0)
Alkaline Phosphatase: 92 U/L (ref 38–126)
Anion gap: 5 (ref 5–15)
BUN: 18 mg/dL (ref 8–23)
CO2: 35 mmol/L — ABNORMAL HIGH (ref 22–32)
Calcium: 9.2 mg/dL (ref 8.9–10.3)
Chloride: 102 mmol/L (ref 98–111)
Creatinine: 1.72 mg/dL — ABNORMAL HIGH (ref 0.44–1.00)
GFR, Estimated: 31 mL/min — ABNORMAL LOW (ref >60)
Glucose, Bld: 85 mg/dL (ref 70–99)
Potassium: 4.2 mmol/L (ref 3.5–5.1)
Sodium: 142 mmol/L (ref 135–145)
Total Bilirubin: 0.6 mg/dL (ref 0.3–1.2)
Total Protein: 6.9 g/dL (ref 6.5–8.1)

## 2022-08-12 NOTE — Progress Notes (Signed)
[facility] [phone]   [phone]  PROGRESS NOTE  Patient Care Team: Pincus Sanes, MD as PCP - General (Internal Medicine)  Hematological/Oncological History 1) 05/08/2020: Presented with leg pain with difficulty walking x 10 days. Doppler US confirmed age indeterminate DVT involving right posterior tibial veins and left posterior tibial veins. CTA was negative for pulmonary embolism. Started on Eliquis.   2) 10/02/2020: Establish care with Georga Kaufmann PA-C  CHIEF COMPLAINTS/PURPOSE OF CONSULTATION:  Bilateral lower extremity DVT  HISTORY OF PRESENTING ILLNESS:  April [name] 72 y.o. female returns for follow-up for history of bilateral lower extremity DVT.  Patient was last seen by me on 06/13/2021. In the interim, she has continues on eliquis therapy.    On exam today, Mrs. Eng reports she is tolerating eliquis therapy without any bleeding or bruising episodes. She has noticed increased swelling in her left leg more than right leg over the last 1-2 weeks. She reports her energy  and appetite are fairly stable.  She continues to have a sedentary lifestyle and uses a wheelchair for ambulation.   She has stable, chronic shortness of breath with exertion.  She denies any fevers, chills, night sweats, pain, cough, nausea, vomiting or bowel habit changes.  She has no other complaints.  Rest of the 10 point ROS is below  MEDICAL HISTORY:  Past Medical History:  Diagnosis Date   Anemia    Asthma    CAD (coronary artery disease)    Carpal tunnel syndrome    Cellulitis of both lower extremities 04/11/2015   CHF (congestive heart failure) (HCC)    Chronic kidney disease    CKI- followed by [facility]   Colon polyp, hyperplastic 2007 & 2012   Complication of anesthesia 1999   svt with renal calculi surgery, no problems since   CTS (carpal tunnel syndrome)    bilateral   Diabetes mellitus    Eczema    GERD (gastroesophageal reflux  disease)    History of kidney stones 1999   Hyperlipidemia    Hypertension    Leg ulcer (HCC) 04/24/2015   Right lateral leg No evidence of an infection Monitor closely Keep edema controlled    Leg ulcer (HCC)    right lower   Meralgia paresthetica    Dr. Terrace Arabia   Morbid obesity (HCC)    Neuropathy    toes and legs   Osteoarthrosis, unspecified whether generalized or localized, lower leg    knee   PUD (peptic ulcer disease)    Shortness of breath dyspnea    with exertion   Sleep apnea    per progress note 02/25/2018   Type II or unspecified type diabetes mellitus without mention of complication, not stated as uncontrolled    Unspecified hereditary and idiopathic peripheral neuropathy    Urticaria    Vitamin B12 deficiency    Wears glasses    Wound cellulitis    right upper leg, healing well    SURGICAL HISTORY: Past Surgical History:  Procedure Laterality Date   ABDOMINAL HYSTERECTOMY     BIOPSY  03/20/2021   Procedure: BIOPSY;  Surgeon: Beverley Fiedler, MD;  Location: [facility];  Service: Gastroenterology;;   BREAST BIOPSY Right 07/08/2018   CARDIAC CATHETERIZATION  2002   non obstructive disease   colonoscopy with polypectomy  2007 & 2012    hyperplastic ;Dr Ewing Schlein   COLONOSCOPY WITH PROPOFOL N/A 06/04/2015   Procedure: COLONOSCOPY WITH PROPOFOL;  Surgeon: Beverley Fiedler,  MD;  Location: [facility];  Service: Gastroenterology;  Laterality: N/A;   COLONOSCOPY WITH PROPOFOL N/A 03/20/2021   Procedure: COLONOSCOPY WITH PROPOFOL;  Surgeon: Beverley Fiedler, MD;  Location: [facility];  Service: Gastroenterology;  Laterality: N/A;   DEBRIDEMENT LEG Right 03/02/2018   WOUND VAC APPLIED   DEBRIDEMENT LEG Right 05/27/2018   RIGHT LOWER LEG DEBRIDEMENT,  SKIN GRAFT, VAC PLACEMENT    DILATION AND CURETTAGE OF UTERUS     multiple   ESOPHAGOGASTRODUODENOSCOPY (EGD) WITH PROPOFOL N/A 03/20/2021   Procedure: ESOPHAGOGASTRODUODENOSCOPY (EGD) WITH PROPOFOL;  Surgeon: Beverley Fiedler, MD;   Location: [facility];  Service: Gastroenterology;  Laterality: N/A;   HEMORRHOID SURGERY     HEMORROIDECTOMY     I & D EXTREMITY Right 03/02/2018   Procedure: RIGHT LEG DEBRIDEMENT AND PLACE VAC;  Surgeon: Nadara Mustard, MD;  Location: [facility];  Service: Orthopedics;  Laterality: Right;   I & D EXTREMITY Right 03/04/2018   Procedure: REPEAT IRRIGATION AND DEBRIDEMENT RIGHT LEG, PLACE WOUND VAC;  Surgeon: Nadara Mustard, MD;  Location: [facility];  Service: Orthopedics;  Laterality: Right;   I & D EXTREMITY Right 03/30/2018   Procedure: IRRIGATION AND DEBRIDEMENT RIGHT LEG, APPLY WOUND VAC;  Surgeon: Nadara Mustard, MD;  Location: [facility];  Service: Orthopedics;  Laterality: Right;   I & D EXTREMITY Right 05/27/2018   Procedure: RIGHT LOWER LEG DEBRIDEMENT,  SKIN GRAFT, VAC PLACEMENT;  Surgeon: Nadara Mustard, MD;  Location: [facility];  Service: Orthopedics;  Laterality: Right;  RIGHT LOWER LEG DEBRIDEMENT,  SKIN GRAFT, VAC PLACEMENT   POLYPECTOMY  03/20/2021   Procedure: POLYPECTOMY;  Surgeon: Beverley Fiedler, MD;  Location: [facility];  Service: Gastroenterology;;   renal calculi  12/1997   SVT with induction of anesthesia   RIGHT HEART CATH N/A 11/19/2020   Procedure: RIGHT HEART CATH;  Surgeon: Dolores Patty, MD;  Location: [facility];  Service: Cardiovascular;  Laterality: N/A;   right knee arthroscopy     SKIN SPLIT GRAFT Right 03/04/2018   Procedure: POSSIBLE SPLIT THICKNESS SKIN GRAFT;  Surgeon: Nadara Mustard, MD;  Location: [facility] OR;  Service: Orthopedics;  Laterality: Right;   SKIN SPLIT GRAFT Right 04/01/2018   Procedure: REPEAT IRRIGATION AND DEBRIDEMENT RIGHT LEG, APPLY SPLIT THICKNESS SKIN GRAFT;  Surgeon: Nadara Mustard, MD;  Location: [facility];  Service: Orthopedics;  Laterality: Right;   TONSILLECTOMY AND ADENOIDECTOMY      SOCIAL HISTORY: Social History   Socioeconomic History   Marital status: Married    Spouse name: Dwight   Number of children: 1   Years of education:  BS   Highest education level: Not on file  Occupational History   Occupation: Disabled    Employer: RETIRED  Tobacco Use   Smoking status: Never   Smokeless tobacco: Never  Vaping Use   Vaping Use: Never used  Substance and Sexual Activity   Alcohol use: No    Alcohol/week: 0.0 standard drinks of alcohol   Drug use: No   Sexual activity: Not Currently  Other Topics Concern   Not on file  Social History Narrative   Patient lives at home with her husband Karren Burly) . Patient is retired and has a Lawyer.    Caffeine - some times.   Right handed.   Social Determinants of Health   Financial Resource Strain: Low Risk  (07/08/2022)   Overall Financial Resource Strain (CARDIA)    Difficulty of  Paying Living Expenses: Not hard at all  Food Insecurity: No Food Insecurity (07/08/2022)   Hunger Vital Sign    Worried About Running Out of Food in the Last Year: Never true    Ran Out of Food in the Last Year: Never true  Transportation Needs: No Transportation Needs (07/08/2022)   PRAPARE - Administrator, Civil Service (Medical): No    Lack of Transportation (Non-Medical): No  Physical Activity: Inactive (07/08/2022)   Exercise Vital Sign    Days of Exercise per Week: 0 days    Minutes of Exercise per Session: 0 min  Stress: No Stress Concern Present (07/08/2022)   [employer] of Occupational Health - Occupational Stress Questionnaire    Feeling of Stress : Only a little  Social Connections: Socially Integrated (07/08/2022)   Social Connection and Isolation Panel [NHANES]    Frequency of Communication with Friends and Family: More than three times a week    Frequency of Social Gatherings with Friends and Family: Once a week    Attends Religious Services: More than 4 times per year    Active Member of [employer] or Organizations: Yes    Attends Banker Meetings: 1 to 4 times per year    Marital Status: Married  Catering manager Violence: Not At Risk  (07/08/2022)   Humiliation, Afraid, Rape, and Kick questionnaire    Fear of Current or Ex-Partner: No    Emotionally Abused: No    Physically Abused: No    Sexually Abused: No    FAMILY HISTORY: Family History  Problem Relation Age of Onset   Colon cancer Mother    Prostate cancer Father    Colon cancer Father    Diabetes Maternal Aunt    Breast cancer Maternal Aunt    Diabetes Maternal Uncle    Diabetes Paternal Aunt    Stroke Paternal Aunt        > 65   Heart disease Paternal Aunt    Diabetes Paternal Uncle    Breast cancer Maternal Aunt         X 2   Breast cancer Cousin     ALLERGIES:  is allergic to penicillins, shellfish allergy, sulfonamide derivatives, hydrochlorothiazide w-triamterene, lotensin [benazepril hcl], dipyridamole, estrogens, hydrochlorothiazide, latex, metronidazole, other, spironolactone, torsemide, valsartan, and mustard [allyl isothiocyanate].  MEDICATIONS:  Current Outpatient Medications  Medication Sig Dispense Refill   acetaminophen (TYLENOL) 650 MG CR tablet Take 1,300 mg by mouth every 8 (eight) hours as needed for pain.     Alcohol Swabs (DROPSAFE ALCOHOL PREP) 70 % PADS USE TOPICALLY AS DIRECTED 300 each 3   allopurinol (ZYLOPRIM) 100 MG tablet TAKE 2 TABLETS(200 MG) BY MOUTH DAILY 180 tablet 1   apixaban (ELIQUIS) 2.5 MG TABS tablet Take 1 tablet (2.5 mg total) by mouth 2 (two) times daily. 180 tablet 1   atorvastatin (LIPITOR) 40 MG tablet TAKE 1 TABLET(40 MG) BY MOUTH AT BEDTIME 90 tablet 2   Blood Glucose Calibration (TRUE METRIX LEVEL 1) Low SOLN UAD  E11.9 1 each 3   Blood Glucose Monitoring Suppl (TRUE METRIX METER) DEVI True Metrix Level 1 solution     Budeson-Glycopyrrol-Formoterol (BREZTRI AEROSPHERE) 160-9-4.8 MCG/ACT AERO Inhale 2 puffs into the lungs in the morning and at bedtime. 32.1 g 3   calcium carbonate (TUMS - DOSED IN MG ELEMENTAL CALCIUM) 500 MG chewable tablet Chew 2 tablets by mouth daily as needed for indigestion or  heartburn.     cyanocobalamin (,VITAMIN B-12,)  1000 MCG/ML injection INJECT INTO MUSCLE EVERY 30 DAYS 10 mL 5   dapagliflozin propanediol (FARXIGA) 10 MG TABS tablet Take 10 mg by mouth daily.     diclofenac Sodium (VOLTAREN) 1 % GEL Apply 1 application topically as needed (pain).     diphenhydrAMINE (BENADRYL) 12.5 MG/5ML liquid Take 25 mg by mouth 4 (four) times daily as needed for allergies.     doxycycline (VIBRAMYCIN) 100 MG capsule Take 100 mg by mouth 2 (two) times daily.     eszopiclone (LUNESTA) 1 MG TABS tablet Take 1 mg by mouth at bedtime as needed for sleep. Take immediately before bedtime     famotidine (PEPCID) 40 MG tablet Take 1 tablet (40 mg total) by mouth daily. 180 tablet 3   FEROSUL 325 (65 Fe) MG tablet TAKE 1 TABLET BY MOUTH EVERY DAY ON MONDAY, WEDNESDAY, FRIDAYS 60 tablet 0   fluticasone (FLONASE) 50 MCG/ACT nasal spray Place 2 sprays into both nostrils daily. 18.2 mL 2   gabapentin (NEURONTIN) 100 MG capsule TAKE 1 CAPSULE(100 MG) BY MOUTH THREE TIMES DAILY 90 capsule 1   guaiFENesin-dextromethorphan (ROBITUSSIN DM) 100-10 MG/5ML syrup Take 5 mLs by mouth every 4 (four) hours as needed for cough.     hydrOXYzine (ATARAX) 25 MG tablet Take 1 tablet (25 mg total) by mouth 3 (three) times daily as needed for itching. 90 tablet 1   loratadine (CLARITIN) 10 MG tablet Take 1 tablet (10 mg total) by mouth daily. 30 tablet 5   montelukast (SINGULAIR) 10 MG tablet TAKE 1 TABLET(10 MG) BY MOUTH AT BEDTIME 30 tablet 5   nebivolol (BYSTOLIC) 5 MG tablet TAKE 1 TABLET(5 MG) BY MOUTH DAILY 30 tablet 3   ondansetron (ZOFRAN) 4 MG tablet Take 1 tablet (4 mg total) by mouth every 8 (eight) hours as needed for nausea or vomiting. 20 tablet 0   Oxcarbazepine (TRILEPTAL) 300 MG tablet Take 1 tablet (300 mg total) by mouth 2 (two) times daily. 180 tablet 1   oxyCODONE (OXY IR/ROXICODONE) 5 MG immediate release tablet Take 1 tablet (5 mg total) by mouth every 6 (six) hours as needed for  severe pain (Chronic knee osteoarthritis). 30 tablet 0   polyethylene glycol (MIRALAX / GLYCOLAX) 17 g packet Take 17 g by mouth daily as needed for moderate constipation.     potassium chloride SA (KLOR-CON M) 20 MEQ tablet Take 1 tablet (20 mEq total) by mouth 2 (two) times daily. 180 tablet 3   Spacer/Aero-Holding [facility] Use with inhaler. 1 each 2   spironolactone (ALDACTONE) 25 MG tablet TAKE 1 TABLET(25 MG) BY MOUTH DAILY 90 tablet 0   tirzepatide (MOUNJARO) 15 MG/0.5ML Pen Inject 15 mg into the skin once a week. 6 mL 2   torsemide (DEMADEX) 20 MG tablet Take 40 mg in AM and 20 mg in PM 360 tablet 3   triamcinolone ointment (KENALOG) 0.5 % Apply 1 Application topically as needed (knee pain).     VENTOLIN HFA 108 (90 Base) MCG/ACT inhaler Inhale 2 puffs into the lungs every 4 (four) hours as needed for wheezing.     No current facility-administered medications for this visit.    REVIEW OF SYSTEMS:   Constitutional: ( - ) fevers, ( - )  chills , ( - ) night sweats Eyes: ( - ) blurriness of vision, ( - ) double vision, ( - ) watery eyes Ears, nose, mouth, throat, and face: ( - ) mucositis, ( - ) sore throat Respiratory: ( - )  cough, (+) dyspnea, ( - ) wheezes Cardiovascular: ( - ) palpitation, ( - ) chest discomfort, ( +) lower extremity swelling Gastrointestinal:  ( - ) nausea, ( - ) heartburn, ( - ) change in bowel habits Skin: ( - ) abnormal skin rashes Lymphatics: ( - ) new lymphadenopathy, ( - ) easy bruising Neurological: ( + ) numbness, ( - ) tingling, ( - ) new weaknesses Behavioral/Psych: ( - ) mood change, ( - ) new changes  All other systems were reviewed with the patient and are negative.  PHYSICAL EXAMINATION: ECOG PERFORMANCE STATUS: 2 - Symptomatic, <50% confined to bed  Vitals:   08/12/22 1113  BP: 123/82  Pulse: 89  Resp: 18  Temp: 97.7 F (36.5 C)  SpO2: 92%   GENERAL: well appearing female in NAD, obese and in a wheelchair for the visit.  SKIN: skin  color, texture, turgor are normal, no rashes or significant lesions EYES: conjunctiva are pink and non-injected, sclera clear LUNGS: clear to auscultation and percussion with normal breathing effort HEART: regular rate & rhythm and no murmurs. Bilateral lower extremity edema, left greater than right.  Musculoskeletal: no cyanosis of digits and no clubbing  PSYCH: alert & oriented x 3, fluent speech NEURO: no focal motor/sensory deficits  LABORATORY DATA:  I have reviewed the data as listed    Latest Ref Rng & Units 08/12/2022   10:08 AM 07/03/2022    8:51 AM 02/24/2022   10:02 AM  CBC  WBC 4.0 - 10.5 K/uL 5.1  6.8  5.0   Hemoglobin 12.0 - 15.0 g/dL 40.9  81.1  91.4   Hematocrit 36.0 - 46.0 % 41.4  40.6  40.0   Platelets 150 - 400 K/uL 157  157.0  147        Latest Ref Rng & Units 08/12/2022   10:08 AM 07/03/2022    8:51 AM 02/24/2022   10:02 AM  CMP  Glucose 70 - 99 mg/dL 85  82  90   BUN 8 - 23 mg/dL 18  30  15    Creatinine 0.44 - 1.00 mg/dL 7.82  9.56  2.13   Sodium 135 - 145 mmol/L 142  140  138   Potassium 3.5 - 5.1 mmol/L 4.2  4.1  4.6   Chloride 98 - 111 mmol/L 102  98  98   CO2 22 - 32 mmol/L 35  34  33   Calcium 8.9 - 10.3 mg/dL 9.2  9.1  9.0   Total Protein 6.5 - 8.1 g/dL 6.9  6.6    Total Bilirubin 0.3 - 1.2 mg/dL 0.6  0.8    Alkaline Phos 38 - 126 U/L 92  113    AST 15 - 41 U/L 14  19    ALT 0 - 44 U/L 11  18     RADIOGRAPHIC STUDIES: I have personally reviewed the radiological images as listed and agreed with the findings in the report.  05/07/2020: CTA chest: Slightly suboptimal opacification of the main pulmonary artery however no central or proximal segmental pulmonary embolism. Mild cardiomegaly in findings suggestive of pulmonary arterial Hypertension. Minimal ground-glass opacity at both lung bases which may be due to Atelectasis. Small hiatal hernia. Aortic Atherosclerosis.  05/08/2020: Doppler US Lower Extremity: Findings consistent with  age-indeterminate deep vein thrombosis involving the right posterior tibial vein and the left posterior tibial veins.  ASSESSMENT & PLAN Orena Page Feltz is a 72 y.o. female returns for a follow up for bilateral lower  extremity edema, currently on Eliquis.    #Bilateral lower extremity DVT involving the posterior tibial veins: -- Currently on Eliquis 2.5 mg twice daily with good tolerance. If patient is unable to afford Eliquis, we will consider switching her to coumadin.  -- Risk factors include sedentary lifestyle and obesity.  Given no definitive provoking factor and unmodifiable risk factors, recommend indefinite anticoagulation. -- No evidence of antiphospholipid syndrome based on laboratory evaluation from 10/02/2020. --Labs from today were reviewed and require no intervention. No cytopenias. Creatinine stable.  --RTC in 12 months with labs prior.   #Left > right lower extremity edema: --Worsening over the last 1-2 weeks --Obtained bilateral lower extremity US today without evidence of acute DVT. --Encouraged to wear compression stockings.   #Thrombocytopenia--resolved: --No evidence of nutritional deficiencies based on labs form 11/19/2020.  --HIV serology from 11/15/2020 was nonreative --No evidence of hepatitis B, hepatitis C based on labs from 12/31/2020.  --Platelet count is 157 today. No further workup is needed  #Mild normocytic anemia--resolved: --Likely secondary to chronic disease including CKD.  --No evidence of vitamin B12, iron or folate deficiencies based on labs from 11/19/2020. There is adequate reticulocytes.  --Hgb is normal today at 12.8. Monitor for now.   No orders of the defined types were placed in this encounter.    All questions were answered. The patient knows to call the clinic with any problems, questions or concerns.  I have spent a total of 30 minutes minutes of face-to-face and non-face-to-face time, preparing to see the patient, obtaining  and/or reviewing separately obtained history, performing a medically appropriate examination, counseling and educating the patient, ordering tests, documenting clinical information in the electronic health record,and care coordination.    Georga Kaufmann, PA-C Department of Hematology/Oncology [facility] Phone: [phone]

## 2022-08-13 ENCOUNTER — Telehealth: Payer: Self-pay

## 2022-08-13 NOTE — Telephone Encounter (Signed)
-----   Message from Briant Cedar sent at 08/12/2022 11:00 PM EDT ----- Please notify patient there is no evidence of DVT.

## 2022-08-13 NOTE — Telephone Encounter (Signed)
Pt advised with VU 

## 2022-08-16 DIAGNOSIS — J9601 Acute respiratory failure with hypoxia: Secondary | ICD-10-CM | POA: Diagnosis not present

## 2022-08-16 DIAGNOSIS — J9602 Acute respiratory failure with hypercapnia: Secondary | ICD-10-CM | POA: Diagnosis not present

## 2022-08-16 DIAGNOSIS — I5033 Acute on chronic diastolic (congestive) heart failure: Secondary | ICD-10-CM | POA: Diagnosis not present

## 2022-08-17 ENCOUNTER — Ambulatory Visit: Payer: Medicare PPO | Admitting: Radiology

## 2022-08-17 DIAGNOSIS — E538 Deficiency of other specified B group vitamins: Secondary | ICD-10-CM

## 2022-08-17 MED ORDER — CYANOCOBALAMIN 1000 MCG/ML IJ SOLN
1000.0000 ug | Freq: Once | INTRAMUSCULAR | Status: AC
Start: 2022-08-17 — End: 2022-08-17
  Administered 2022-08-17: 1000 ug via INTRAMUSCULAR

## 2022-08-17 NOTE — Progress Notes (Signed)
 Pt here for monthly B12 injection per Dr. Quay Burow  B12 1017mg given IM, and pt tolerated injection well.

## 2022-08-19 ENCOUNTER — Telehealth: Payer: Self-pay | Admitting: Internal Medicine

## 2022-08-19 ENCOUNTER — Ambulatory Visit: Payer: Medicare PPO

## 2022-08-19 NOTE — Telephone Encounter (Signed)
Patient being seen tomorrow at [facility] location per request.

## 2022-08-19 NOTE — Telephone Encounter (Signed)
Pt wanting a nurse to call back about what she can do for her back until her appt Monday.

## 2022-08-20 ENCOUNTER — Ambulatory Visit: Payer: Medicare PPO | Admitting: Internal Medicine

## 2022-08-20 ENCOUNTER — Encounter: Payer: Self-pay | Admitting: Internal Medicine

## 2022-08-20 VITALS — BP 100/60 | HR 98 | Temp 97.5°F | Ht 62.0 in | Wt 321.0 lb

## 2022-08-20 DIAGNOSIS — M25552 Pain in left hip: Secondary | ICD-10-CM | POA: Diagnosis not present

## 2022-08-20 NOTE — Patient Instructions (Signed)
Please continue the tylenol--up to 2 extra strength three times a day. Try ice and heat to see if one or both help  You can try over the counter diclofenac gel right on the painful area up to three times a day

## 2022-08-20 NOTE — Assessment & Plan Note (Signed)
Tender right over the bursa but mechanism of injury more consistent with muscle injury Doesn't seem to be related to her back Discussed trying ice &/or heat Can try over the counter diclofenac topical Sports medicine evaluation if pain continues

## 2022-08-20 NOTE — Progress Notes (Signed)
Subjective:    Patient ID: April [name], female    DOB: 02-12-1950, 72 y.o.   MRN: [id number]  HPI Here due to left side With husband  Was cleaning and washing feet and got bad pain--3 days ago Got up but it didn't get better Now is ongoing--points to lateral right hip May move towards groin and may feel it in her back Worse standing up or getting in the car  Tylenol helped some--she was able to get to sleep  Got nauseated this morning taking meds Didn't vomit---able to drink water and that got better  Current Outpatient Medications on File Prior to Visit  Medication Sig Dispense Refill   acetaminophen (TYLENOL) 650 MG CR tablet Take 1,300 mg by mouth every 8 (eight) hours as needed for pain.     Alcohol Swabs (DROPSAFE ALCOHOL PREP) 70 % PADS USE TOPICALLY AS DIRECTED 300 each 3   allopurinol (ZYLOPRIM) 100 MG tablet TAKE 2 TABLETS(200 MG) BY MOUTH DAILY 180 tablet 1   apixaban (ELIQUIS) 2.5 MG TABS tablet Take 1 tablet (2.5 mg total) by mouth 2 (two) times daily. 180 tablet 1   atorvastatin (LIPITOR) 40 MG tablet TAKE 1 TABLET(40 MG) BY MOUTH AT BEDTIME 90 tablet 2   Blood Glucose Calibration (TRUE METRIX LEVEL 1) Low SOLN UAD  E11.9 1 each 3   Blood Glucose Monitoring Suppl (TRUE METRIX METER) DEVI True Metrix Level 1 solution     Budeson-Glycopyrrol-Formoterol (BREZTRI AEROSPHERE) 160-9-4.8 MCG/ACT AERO Inhale 2 puffs into the lungs in the morning and at bedtime. 32.1 g 3   calcium carbonate (TUMS - DOSED IN MG ELEMENTAL CALCIUM) 500 MG chewable tablet Chew 2 tablets by mouth daily as needed for indigestion or heartburn.     cyanocobalamin (,VITAMIN B-12,) 1000 MCG/ML injection INJECT INTO MUSCLE EVERY 30 DAYS 10 mL 5   dapagliflozin propanediol (FARXIGA) 10 MG TABS tablet Take 10 mg by mouth daily.     diclofenac Sodium (VOLTAREN) 1 % GEL Apply 1 application topically as needed (pain).     diphenhydrAMINE (BENADRYL) 12.5 MG/5ML liquid Take 25 mg by mouth 4 (four) times  daily as needed for allergies.     doxycycline (VIBRAMYCIN) 100 MG capsule Take 100 mg by mouth 2 (two) times daily.     eszopiclone (LUNESTA) 1 MG TABS tablet Take 1 mg by mouth at bedtime as needed for sleep. Take immediately before bedtime     famotidine (PEPCID) 40 MG tablet Take 1 tablet (40 mg total) by mouth daily. 180 tablet 3   FEROSUL 325 (65 Fe) MG tablet TAKE 1 TABLET BY MOUTH EVERY DAY ON MONDAY, WEDNESDAY, FRIDAYS 60 tablet 0   fluticasone (FLONASE) 50 MCG/ACT nasal spray Place 2 sprays into both nostrils daily. 18.2 mL 2   gabapentin (NEURONTIN) 100 MG capsule TAKE 1 CAPSULE(100 MG) BY MOUTH THREE TIMES DAILY 90 capsule 1   guaiFENesin-dextromethorphan (ROBITUSSIN DM) 100-10 MG/5ML syrup Take 5 mLs by mouth every 4 (four) hours as needed for cough.     hydrOXYzine (ATARAX) 25 MG tablet Take 1 tablet (25 mg total) by mouth 3 (three) times daily as needed for itching. 90 tablet 1   loratadine (CLARITIN) 10 MG tablet Take 1 tablet (10 mg total) by mouth daily. 30 tablet 5   montelukast (SINGULAIR) 10 MG tablet TAKE 1 TABLET(10 MG) BY MOUTH AT BEDTIME 30 tablet 5   nebivolol (BYSTOLIC) 5 MG tablet TAKE 1 TABLET(5 MG) BY MOUTH DAILY 30 tablet 3  ondansetron (ZOFRAN) 4 MG tablet Take 1 tablet (4 mg total) by mouth every 8 (eight) hours as needed for nausea or vomiting. 20 tablet 0   Oxcarbazepine (TRILEPTAL) 300 MG tablet Take 1 tablet (300 mg total) by mouth 2 (two) times daily. 180 tablet 1   oxyCODONE (OXY IR/ROXICODONE) 5 MG immediate release tablet Take 1 tablet (5 mg total) by mouth every 6 (six) hours as needed for severe pain (Chronic knee osteoarthritis). 30 tablet 0   polyethylene glycol (MIRALAX / GLYCOLAX) 17 g packet Take 17 g by mouth daily as needed for moderate constipation.     potassium chloride SA (KLOR-CON M) 20 MEQ tablet Take 1 tablet (20 mEq total) by mouth 2 (two) times daily. 180 tablet 3   Spacer/Aero-Holding [facility] Use with inhaler. 1 each 2    spironolactone (ALDACTONE) 25 MG tablet TAKE 1 TABLET(25 MG) BY MOUTH DAILY 90 tablet 0   tirzepatide (MOUNJARO) 15 MG/0.5ML Pen Inject 15 mg into the skin once a week. 6 mL 2   torsemide (DEMADEX) 20 MG tablet Take 40 mg in AM and 20 mg in PM 360 tablet 3   triamcinolone ointment (KENALOG) 0.5 % Apply 1 Application topically as needed (knee pain).     VENTOLIN HFA 108 (90 Base) MCG/ACT inhaler Inhale 2 puffs into the lungs every 4 (four) hours as needed for wheezing.     No current facility-administered medications on file prior to visit.    Allergies  Allergen Reactions   Penicillins Rash and Other (See Comments)    She was told not to take it anymore. Has patient had a PCN reaction causing immediate rash, facial/tongue/throat swelling, SOB or lightheadedness with hypotension: Yes Has patient had a PCN reaction causing severe rash involving mucus membranes or skin necrosis: No Has patient had a PCN reaction that required hospitalization: No Has patient had a PCN reaction occurring within the last 10 years: No If all of the above answers are "NO", then may proceed with Cephalosporin use.'   Shellfish Allergy Anaphylaxis   Sulfonamide Derivatives Anaphylaxis and Other (See Comments)   Hydrochlorothiazide W-Triamterene Other (See Comments)    Hypokalemia   Lotensin [Benazepril Hcl] Hives   Dipyridamole Other (See Comments)    Unknown reaction   Estrogens Other (See Comments)    Unknown reaction   Hydrochlorothiazide Other (See Comments)    Unknown reaction, Hypokalemia?   Latex Other (See Comments)    Unknown reaction - stated previously during surgery gloves had to be changed, but unsure if it is still an allergy or not.    Metronidazole Other (See Comments)    Unknown reaction   Other Itching    PICKLES   Spironolactone Other (See Comments)    UNSPECIFIED > "kidney problems"   Torsemide Other (See Comments)    Unknown reaction   Valsartan Other (See Comments)    Unknown  reaction   Mustard [Allyl Isothiocyanate] Itching    Past Medical History:  Diagnosis Date   Anemia    Asthma    CAD (coronary artery disease)    Carpal tunnel syndrome    Cellulitis of both lower extremities 04/11/2015   CHF (congestive heart failure) (HCC)    Chronic kidney disease    CKI- followed by [facility]   Colon polyp, hyperplastic 2007 & 2012   Complication of anesthesia 1999   svt with renal calculi surgery, no problems since   CTS (carpal tunnel syndrome)    bilateral   Diabetes  mellitus    Eczema    GERD (gastroesophageal reflux disease)    History of kidney stones 1999   Hyperlipidemia    Hypertension    Leg ulcer (HCC) 04/24/2015   Right lateral leg No evidence of an infection Monitor closely Keep edema controlled    Leg ulcer (HCC)    right lower   Meralgia paresthetica    Dr. Terrace Arabia   Morbid obesity (HCC)    Neuropathy    toes and legs   Osteoarthrosis, unspecified whether generalized or localized, lower leg    knee   PUD (peptic ulcer disease)    Shortness of breath dyspnea    with exertion   Sleep apnea    per progress note 02/25/2018   Type II or unspecified type diabetes mellitus without mention of complication, not stated as uncontrolled    Unspecified hereditary and idiopathic peripheral neuropathy    Urticaria    Vitamin B12 deficiency    Wears glasses    Wound cellulitis    right upper leg, healing well    Past Surgical History:  Procedure Laterality Date   ABDOMINAL HYSTERECTOMY     BIOPSY  03/20/2021   Procedure: BIOPSY;  Surgeon: Beverley Fiedler, MD;  Location: [facility];  Service: Gastroenterology;;   BREAST BIOPSY Right 07/08/2018   CARDIAC CATHETERIZATION  2002   non obstructive disease   colonoscopy with polypectomy  2007 & 2012    hyperplastic ;Dr Ewing Schlein   COLONOSCOPY WITH PROPOFOL N/A 06/04/2015   Procedure: COLONOSCOPY WITH PROPOFOL;  Surgeon: Beverley Fiedler, MD;  Location: [facility];  Service: Gastroenterology;   Laterality: N/A;   COLONOSCOPY WITH PROPOFOL N/A 03/20/2021   Procedure: COLONOSCOPY WITH PROPOFOL;  Surgeon: Beverley Fiedler, MD;  Location: [facility];  Service: Gastroenterology;  Laterality: N/A;   DEBRIDEMENT LEG Right 03/02/2018   WOUND VAC APPLIED   DEBRIDEMENT LEG Right 05/27/2018   RIGHT LOWER LEG DEBRIDEMENT,  SKIN GRAFT, VAC PLACEMENT    DILATION AND CURETTAGE OF UTERUS     multiple   ESOPHAGOGASTRODUODENOSCOPY (EGD) WITH PROPOFOL N/A 03/20/2021   Procedure: ESOPHAGOGASTRODUODENOSCOPY (EGD) WITH PROPOFOL;  Surgeon: Beverley Fiedler, MD;  Location: [facility];  Service: Gastroenterology;  Laterality: N/A;   HEMORRHOID SURGERY     HEMORROIDECTOMY     I & D EXTREMITY Right 03/02/2018   Procedure: RIGHT LEG DEBRIDEMENT AND PLACE VAC;  Surgeon: Nadara Mustard, MD;  Location: [facility] OR;  Service: Orthopedics;  Laterality: Right;   I & D EXTREMITY Right 03/04/2018   Procedure: REPEAT IRRIGATION AND DEBRIDEMENT RIGHT LEG, PLACE WOUND VAC;  Surgeon: Nadara Mustard, MD;  Location: [facility] OR;  Service: Orthopedics;  Laterality: Right;   I & D EXTREMITY Right 03/30/2018   Procedure: IRRIGATION AND DEBRIDEMENT RIGHT LEG, APPLY WOUND VAC;  Surgeon: Nadara Mustard, MD;  Location: [facility] OR;  Service: Orthopedics;  Laterality: Right;   I & D EXTREMITY Right 05/27/2018   Procedure: RIGHT LOWER LEG DEBRIDEMENT,  SKIN GRAFT, VAC PLACEMENT;  Surgeon: Nadara Mustard, MD;  Location: [facility];  Service: Orthopedics;  Laterality: Right;  RIGHT LOWER LEG DEBRIDEMENT,  SKIN GRAFT, VAC PLACEMENT   POLYPECTOMY  03/20/2021   Procedure: POLYPECTOMY;  Surgeon: Beverley Fiedler, MD;  Location: [facility];  Service: Gastroenterology;;   renal calculi  12/1997   SVT with induction of anesthesia   RIGHT HEART CATH N/A 11/19/2020   Procedure: RIGHT HEART CATH;  Surgeon: Dolores Patty, MD;  [facility]  INVASIVE CV LAB;  Service: Cardiovascular;  Laterality: N/A;   right knee arthroscopy     SKIN SPLIT GRAFT Right 03/04/2018    Procedure: POSSIBLE SPLIT THICKNESS SKIN GRAFT;  Surgeon: Nadara Mustard, MD;  Location: [facility] OR;  Service: Orthopedics;  Laterality: Right;   SKIN SPLIT GRAFT Right 04/01/2018   Procedure: REPEAT IRRIGATION AND DEBRIDEMENT RIGHT LEG, APPLY SPLIT THICKNESS SKIN GRAFT;  Surgeon: Nadara Mustard, MD;  Location: [facility];  Service: Orthopedics;  Laterality: Right;   TONSILLECTOMY AND ADENOIDECTOMY      Family History  Problem Relation Age of Onset   Colon cancer Mother    Prostate cancer Father    Colon cancer Father    Diabetes Maternal Aunt    Breast cancer Maternal Aunt    Diabetes Maternal Uncle    Diabetes Paternal Aunt    Stroke Paternal Aunt        > 65   Heart disease Paternal Aunt    Diabetes Paternal Uncle    Breast cancer Maternal Aunt         X 2   Breast cancer Cousin     Social History   Socioeconomic History   Marital status: Married    Spouse name: Financial planner   Number of children: 1   Years of education: BS   Highest education level: Not on file  Occupational History   Occupation: Disabled    Employer: RETIRED  Tobacco Use   Smoking status: Never   Smokeless tobacco: Never  Vaping Use   Vaping status: Never Used  Substance and Sexual Activity   Alcohol use: No    Alcohol/week: 0.0 standard drinks of alcohol   Drug use: No   Sexual activity: Not Currently  Other Topics Concern   Not on file  Social History Narrative   Patient lives at home with her husband Karren Burly) . Patient is retired and has a Lawyer.    Caffeine - some times.   Right handed.   Social Determinants of Health   Financial Resource Strain: Low Risk  (07/08/2022)   Overall Financial Resource Strain (CARDIA)    Difficulty of Paying Living Expenses: Not hard at all  Food Insecurity: No Food Insecurity (07/08/2022)   Hunger Vital Sign    Worried About Running Out of Food in the Last Year: Never true    Ran Out of Food in the Last Year: Never true  Transportation Needs: No  Transportation Needs (07/08/2022)   PRAPARE - Administrator, Civil Service (Medical): No    Lack of Transportation (Non-Medical): No  Physical Activity: Inactive (07/08/2022)   Exercise Vital Sign    Days of Exercise per Week: 0 days    Minutes of Exercise per Session: 0 min  Stress: No Stress Concern Present (07/08/2022)   [employer] of Occupational Health - Occupational Stress Questionnaire    Feeling of Stress : Only a little  Social Connections: Socially Integrated (07/08/2022)   Social Connection and Isolation Panel [NHANES]    Frequency of Communication with Friends and Family: More than three times a week    Frequency of Social Gatherings with Friends and Family: Once a week    Attends Religious Services: More than 4 times per year    Active Member of [employer] or Organizations: Yes    Attends Banker Meetings: 1 to 4 times per year    Marital Status: Married  Catering manager Violence: Not At Risk (07/08/2022)  Humiliation, Afraid, Rape, and Kick questionnaire    Fear of Current or Ex-Partner: No    Emotionally Abused: No    Physically Abused: No    Sexually Abused: No   Review of Systems Bowels are okay No urinary symptoms    Objective:   Physical Exam Constitutional:      Appearance: Normal appearance.     Comments: In wheelchair--able to stand with minimal assistance  Musculoskeletal:     Comments: Fairly normal internal and external rotation of left hip--though pain with internal rotation No back tenderness  Neurological:     Mental Status: She is alert.            Assessment & Plan:

## 2022-08-24 ENCOUNTER — Telehealth: Payer: Self-pay | Admitting: Internal Medicine

## 2022-08-24 ENCOUNTER — Ambulatory Visit: Payer: Medicare PPO | Admitting: Family Medicine

## 2022-08-24 MED ORDER — OXYCODONE HCL 5 MG PO TABS: 5.0000 mg | ORAL_TABLET | Freq: Four times a day (QID) | ORAL | 0 refills | Status: DC | PRN

## 2022-08-24 NOTE — Telephone Encounter (Signed)
Patient contacted the office regarding back pain she was seen for last week. States she saw Dr. Alphonsus Sias, was told to take extra strength tylenol 3x daily to help with back pain. Patient says she has been doing this as instructed and it is not helping to ease the pain, patient has asked if anything can be sent in to help with this. Patient is using pharmacy [facility] in [place] on [address].

## 2022-08-24 NOTE — Telephone Encounter (Signed)
Spoke to pt. She sees an ortho for her knee. I suggested she call that office to see if they have a back specialist.

## 2022-08-25 ENCOUNTER — Ambulatory Visit
Admission: RE | Admit: 2022-08-25 | Discharge: 2022-08-25 | Disposition: A | Discharge status: Home / Self Care | Payer: Medicare PPO | Source: Ambulatory Visit | Attending: Internal Medicine | Admitting: Internal Medicine

## 2022-08-25 DIAGNOSIS — Z1231 Encounter for screening mammogram for malignant neoplasm of breast: Secondary | ICD-10-CM | POA: Diagnosis not present

## 2022-09-16 DIAGNOSIS — J9601 Acute respiratory failure with hypoxia: Secondary | ICD-10-CM | POA: Diagnosis not present

## 2022-09-16 DIAGNOSIS — J9602 Acute respiratory failure with hypercapnia: Secondary | ICD-10-CM | POA: Diagnosis not present

## 2022-09-16 DIAGNOSIS — I5033 Acute on chronic diastolic (congestive) heart failure: Secondary | ICD-10-CM | POA: Diagnosis not present

## 2022-09-18 ENCOUNTER — Ambulatory Visit: Payer: Medicare PPO

## 2022-09-18 DIAGNOSIS — E538 Deficiency of other specified B group vitamins: Secondary | ICD-10-CM | POA: Diagnosis not present

## 2022-09-18 MED ORDER — CYANOCOBALAMIN 1000 MCG/ML IJ SOLN
1000.0000 ug | Freq: Once | INTRAMUSCULAR | Status: AC
Start: 2022-09-18 — End: 2022-09-18
  Administered 2022-09-18: 1000 ug via INTRAMUSCULAR

## 2022-09-18 NOTE — Progress Notes (Signed)
Pt here for monthly B12 injection per   B12 1000mcg given IM and pt tolerated injection well.    

## 2022-09-22 ENCOUNTER — Telehealth: Payer: Self-pay | Admitting: Internal Medicine

## 2022-09-22 NOTE — Telephone Encounter (Signed)
Pt called insisting she needed a sooner appt and wanting to speak with [facility]. Please advise.

## 2022-09-22 NOTE — Telephone Encounter (Signed)
Attempted to reach patient x 2 today with number provided in chart.  Left message for her to return call to clinic.  If she calls back and she wants to be seen sooner, she can come in on Friday at 10:00 am or 2:40 pm.  If she doesn't wish to wait that long please offer her an appointment at one of our other location if she doesn't mind the drive.   Last option would be Urgent Care if she doesn't want to go to another [place] location.

## 2022-09-23 ENCOUNTER — Ambulatory Visit: Payer: Medicare PPO | Admitting: Internal Medicine

## 2022-09-23 ENCOUNTER — Encounter: Payer: Self-pay | Admitting: Internal Medicine

## 2022-09-23 VITALS — BP 102/60 | HR 82 | Temp 97.4°F | Ht 62.0 in

## 2022-09-23 DIAGNOSIS — J4521 Mild intermittent asthma with (acute) exacerbation: Secondary | ICD-10-CM | POA: Diagnosis not present

## 2022-09-23 MED ORDER — BENZONATATE 200 MG PO CAPS: 200.0000 mg | ORAL_CAPSULE | Freq: Three times a day (TID) | ORAL | 0 refills | Status: DC | PRN

## 2022-09-23 MED ORDER — DOXYCYCLINE HYCLATE 100 MG PO TABS: 100.0000 mg | ORAL_TABLET | Freq: Two times a day (BID) | ORAL | 0 refills | Status: DC

## 2022-09-23 NOTE — Progress Notes (Signed)
Subjective:    Patient ID: April [name], female    DOB: 08-11-1950, 72 y.o.   MRN: [id number]  HPI Here with husband due to respiratory illlness  Started with illness ~ 3weeks ago Tired, sense of smell was off Sore throat--burning at times---still painful Lots of cough---"like I'm strangling". Yellow mucus Clear nasal drainage Headaches Some fever and felt cold Some SOB---with the cough and gagging (uses oxygen at home)  Didn't check for COVID Taking tylenol, robitussin DM (not much help) Prone to bronchitis--does use the albuterol for some wheezing  Current Outpatient Medications on File Prior to Visit  Medication Sig Dispense Refill   acetaminophen (TYLENOL) 650 MG CR tablet Take 1,300 mg by mouth every 8 (eight) hours as needed for pain.     Alcohol Swabs (DROPSAFE ALCOHOL PREP) 70 % PADS USE TOPICALLY AS DIRECTED 300 each 3   allopurinol (ZYLOPRIM) 100 MG tablet TAKE 2 TABLETS(200 MG) BY MOUTH DAILY 180 tablet 1   apixaban (ELIQUIS) 2.5 MG TABS tablet Take 1 tablet (2.5 mg total) by mouth 2 (two) times daily. 180 tablet 1   atorvastatin (LIPITOR) 40 MG tablet TAKE 1 TABLET(40 MG) BY MOUTH AT BEDTIME 90 tablet 2   Blood Glucose Calibration (TRUE METRIX LEVEL 1) Low SOLN UAD  E11.9 1 each 3   Blood Glucose Monitoring Suppl (TRUE METRIX METER) DEVI True Metrix Level 1 solution     Budeson-Glycopyrrol-Formoterol (BREZTRI AEROSPHERE) 160-9-4.8 MCG/ACT AERO Inhale 2 puffs into the lungs in the morning and at bedtime. 32.1 g 3   calcium carbonate (TUMS - DOSED IN MG ELEMENTAL CALCIUM) 500 MG chewable tablet Chew 2 tablets by mouth daily as needed for indigestion or heartburn.     cyanocobalamin (,VITAMIN B-12,) 1000 MCG/ML injection INJECT INTO MUSCLE EVERY 30 DAYS 10 mL 5   dapagliflozin propanediol (FARXIGA) 10 MG TABS tablet Take 10 mg by mouth daily.     diclofenac Sodium (VOLTAREN) 1 % GEL Apply 1 application topically as needed (pain).     diphenhydrAMINE (BENADRYL)  12.5 MG/5ML liquid Take 25 mg by mouth 4 (four) times daily as needed for allergies.     doxycycline (VIBRAMYCIN) 100 MG capsule Take 100 mg by mouth 2 (two) times daily.     eszopiclone (LUNESTA) 1 MG TABS tablet Take 1 mg by mouth at bedtime as needed for sleep. Take immediately before bedtime     famotidine (PEPCID) 40 MG tablet Take 1 tablet (40 mg total) by mouth daily. 180 tablet 3   FEROSUL 325 (65 Fe) MG tablet TAKE 1 TABLET BY MOUTH EVERY DAY ON MONDAY, WEDNESDAY, FRIDAYS 60 tablet 0   fluticasone (FLONASE) 50 MCG/ACT nasal spray Place 2 sprays into both nostrils daily. 18.2 mL 2   gabapentin (NEURONTIN) 100 MG capsule TAKE 1 CAPSULE(100 MG) BY MOUTH THREE TIMES DAILY 90 capsule 1   guaiFENesin-dextromethorphan (ROBITUSSIN DM) 100-10 MG/5ML syrup Take 5 mLs by mouth every 4 (four) hours as needed for cough.     hydrOXYzine (ATARAX) 25 MG tablet Take 1 tablet (25 mg total) by mouth 3 (three) times daily as needed for itching. 90 tablet 1   loratadine (CLARITIN) 10 MG tablet Take 1 tablet (10 mg total) by mouth daily. 30 tablet 5   montelukast (SINGULAIR) 10 MG tablet TAKE 1 TABLET(10 MG) BY MOUTH AT BEDTIME 30 tablet 5   nebivolol (BYSTOLIC) 5 MG tablet TAKE 1 TABLET(5 MG) BY MOUTH DAILY 30 tablet 3   ondansetron (ZOFRAN) 4 MG tablet  Take 1 tablet (4 mg total) by mouth every 8 (eight) hours as needed for nausea or vomiting. 20 tablet 0   Oxcarbazepine (TRILEPTAL) 300 MG tablet Take 1 tablet (300 mg total) by mouth 2 (two) times daily. 180 tablet 1   oxyCODONE (OXY IR/ROXICODONE) 5 MG immediate release tablet Take 1 tablet (5 mg total) by mouth every 6 (six) hours as needed for severe pain (Chronic knee osteoarthritis). 20 tablet 0   polyethylene glycol (MIRALAX / GLYCOLAX) 17 g packet Take 17 g by mouth daily as needed for moderate constipation.     potassium chloride SA (KLOR-CON M) 20 MEQ tablet Take 1 tablet (20 mEq total) by mouth 2 (two) times daily. 180 tablet 3   Spacer/Aero-Holding  [facility] Use with inhaler. 1 each 2   spironolactone (ALDACTONE) 25 MG tablet TAKE 1 TABLET(25 MG) BY MOUTH DAILY 90 tablet 0   tirzepatide (MOUNJARO) 15 MG/0.5ML Pen Inject 15 mg into the skin once a week. 6 mL 2   torsemide (DEMADEX) 20 MG tablet Take 40 mg in AM and 20 mg in PM 360 tablet 3   triamcinolone ointment (KENALOG) 0.5 % Apply 1 Application topically as needed (knee pain).     VENTOLIN HFA 108 (90 Base) MCG/ACT inhaler Inhale 2 puffs into the lungs every 4 (four) hours as needed for wheezing.     No current facility-administered medications on file prior to visit.    Allergies  Allergen Reactions   Penicillins Rash and Other (See Comments)    She was told not to take it anymore. Has patient had a PCN reaction causing immediate rash, facial/tongue/throat swelling, SOB or lightheadedness with hypotension: Yes Has patient had a PCN reaction causing severe rash involving mucus membranes or skin necrosis: No Has patient had a PCN reaction that required hospitalization: No Has patient had a PCN reaction occurring within the last 10 years: No If all of the above answers are "NO", then may proceed with Cephalosporin use.'   Shellfish Allergy Anaphylaxis   Sulfonamide Derivatives Anaphylaxis and Other (See Comments)   Hydrochlorothiazide W-Triamterene Other (See Comments)    Hypokalemia   Lotensin [Benazepril Hcl] Hives   Dipyridamole Other (See Comments)    Unknown reaction   Estrogens Other (See Comments)    Unknown reaction   Hydrochlorothiazide Other (See Comments)    Unknown reaction, Hypokalemia?   Latex Other (See Comments)    Unknown reaction - stated previously during surgery gloves had to be changed, but unsure if it is still an allergy or not.    Metronidazole Other (See Comments)    Unknown reaction   Other Itching    PICKLES   Spironolactone Other (See Comments)    UNSPECIFIED > "kidney problems"   Torsemide Other (See Comments)    Unknown reaction    Valsartan Other (See Comments)    Unknown reaction   Mustard [Allyl Isothiocyanate] Itching    Past Medical History:  Diagnosis Date   Anemia    Asthma    CAD (coronary artery disease)    Carpal tunnel syndrome    Cellulitis of both lower extremities 04/11/2015   CHF (congestive heart failure) (HCC)    Chronic kidney disease    CKI- followed by [facility]   Colon polyp, hyperplastic 2007 & 2012   Complication of anesthesia 1999   svt with renal calculi surgery, no problems since   CTS (carpal tunnel syndrome)    bilateral   Diabetes mellitus    Eczema  GERD (gastroesophageal reflux disease)    History of kidney stones 1999   Hyperlipidemia    Hypertension    Leg ulcer (HCC) 04/24/2015   Right lateral leg No evidence of an infection Monitor closely Keep edema controlled    Leg ulcer (HCC)    right lower   Meralgia paresthetica    Dr. Terrace Arabia   Morbid obesity (HCC)    Neuropathy    toes and legs   Osteoarthrosis, unspecified whether generalized or localized, lower leg    knee   PUD (peptic ulcer disease)    Shortness of breath dyspnea    with exertion   Sleep apnea    per progress note 02/25/2018   Type II or unspecified type diabetes mellitus without mention of complication, not stated as uncontrolled    Unspecified hereditary and idiopathic peripheral neuropathy    Urticaria    Vitamin B12 deficiency    Wears glasses    Wound cellulitis    right upper leg, healing well    Past Surgical History:  Procedure Laterality Date   ABDOMINAL HYSTERECTOMY     BIOPSY  03/20/2021   Procedure: BIOPSY;  Surgeon: Beverley Fiedler, MD;  Location: [facility];  Service: Gastroenterology;;   BREAST BIOPSY Right 07/08/2018   CARDIAC CATHETERIZATION  2002   non obstructive disease   colonoscopy with polypectomy  2007 & 2012    hyperplastic ;Dr Ewing Schlein   COLONOSCOPY WITH PROPOFOL N/A 06/04/2015   Procedure: COLONOSCOPY WITH PROPOFOL;  Surgeon: Beverley Fiedler, MD;  Location: [facility];  Service: Gastroenterology;  Laterality: N/A;   COLONOSCOPY WITH PROPOFOL N/A 03/20/2021   Procedure: COLONOSCOPY WITH PROPOFOL;  Surgeon: Beverley Fiedler, MD;  Location: [facility];  Service: Gastroenterology;  Laterality: N/A;   DEBRIDEMENT LEG Right 03/02/2018   WOUND VAC APPLIED   DEBRIDEMENT LEG Right 05/27/2018   RIGHT LOWER LEG DEBRIDEMENT,  SKIN GRAFT, VAC PLACEMENT    DILATION AND CURETTAGE OF UTERUS     multiple   ESOPHAGOGASTRODUODENOSCOPY (EGD) WITH PROPOFOL N/A 03/20/2021   Procedure: ESOPHAGOGASTRODUODENOSCOPY (EGD) WITH PROPOFOL;  Surgeon: Beverley Fiedler, MD;  Location: [facility];  Service: Gastroenterology;  Laterality: N/A;   HEMORRHOID SURGERY     HEMORROIDECTOMY     I & D EXTREMITY Right 03/02/2018   Procedure: RIGHT LEG DEBRIDEMENT AND PLACE VAC;  Surgeon: Nadara Mustard, MD;  Location: [facility] OR;  Service: Orthopedics;  Laterality: Right;   I & D EXTREMITY Right 03/04/2018   Procedure: REPEAT IRRIGATION AND DEBRIDEMENT RIGHT LEG, PLACE WOUND VAC;  Surgeon: Nadara Mustard, MD;  Location: [facility];  Service: Orthopedics;  Laterality: Right;   I & D EXTREMITY Right 03/30/2018   Procedure: IRRIGATION AND DEBRIDEMENT RIGHT LEG, APPLY WOUND VAC;  Surgeon: Nadara Mustard, MD;  Location: [facility];  Service: Orthopedics;  Laterality: Right;   I & D EXTREMITY Right 05/27/2018   Procedure: RIGHT LOWER LEG DEBRIDEMENT,  SKIN GRAFT, VAC PLACEMENT;  Surgeon: Nadara Mustard, MD;  Location: [facility];  Service: Orthopedics;  Laterality: Right;  RIGHT LOWER LEG DEBRIDEMENT,  SKIN GRAFT, VAC PLACEMENT   POLYPECTOMY  03/20/2021   Procedure: POLYPECTOMY;  Surgeon: Beverley Fiedler, MD;  Location: [facility];  Service: Gastroenterology;;   renal calculi  12/1997   SVT with induction of anesthesia   RIGHT HEART CATH N/A 11/19/2020   Procedure: RIGHT HEART CATH;  Surgeon: Dolores Patty, MD;  Location: [facility];  Service: Cardiovascular;  Laterality:  N/A;   right knee arthroscopy      SKIN SPLIT GRAFT Right 03/04/2018   Procedure: POSSIBLE SPLIT THICKNESS SKIN GRAFT;  Surgeon: Nadara Mustard, MD;  [facility];  Service: Orthopedics;  Laterality: Right;   SKIN SPLIT GRAFT Right 04/01/2018   Procedure: REPEAT IRRIGATION AND DEBRIDEMENT RIGHT LEG, APPLY SPLIT THICKNESS SKIN GRAFT;  Surgeon: Nadara Mustard, MD;  Location: [facility];  Service: Orthopedics;  Laterality: Right;   TONSILLECTOMY AND ADENOIDECTOMY      Family History  Problem Relation Age of Onset   Colon cancer Mother    Prostate cancer Father    Colon cancer Father    Diabetes Maternal Aunt    Breast cancer Maternal Aunt    Diabetes Maternal Uncle    Diabetes Paternal Aunt    Stroke Paternal Aunt        > 65   Heart disease Paternal Aunt    Diabetes Paternal Uncle    Breast cancer Maternal Aunt         X 2   Breast cancer Cousin     Social History   Socioeconomic History   Marital status: Married    Spouse name: Financial planner   Number of children: 1   Years of education: BS   Highest education level: Not on file  Occupational History   Occupation: Disabled    Employer: RETIRED  Tobacco Use   Smoking status: Never   Smokeless tobacco: Never  Vaping Use   Vaping status: Never Used  Substance and Sexual Activity   Alcohol use: No    Alcohol/week: 0.0 standard drinks of alcohol   Drug use: No   Sexual activity: Not Currently  Other Topics Concern   Not on file  Social History Narrative   Patient lives at home with her husband Karren Burly) . Patient is retired and has a Lawyer.    Caffeine - some times.   Right handed.   Social Determinants of Health   Financial Resource Strain: Low Risk  (07/08/2022)   Overall Financial Resource Strain (CARDIA)    Difficulty of Paying Living Expenses: Not hard at all  Food Insecurity: No Food Insecurity (07/08/2022)   Hunger Vital Sign    Worried About Running Out of Food in the Last Year: Never true    Ran Out of Food in the Last Year: Never  true  Transportation Needs: No Transportation Needs (07/08/2022)   PRAPARE - Administrator, Civil Service (Medical): No    Lack of Transportation (Non-Medical): No  Physical Activity: Inactive (07/08/2022)   Exercise Vital Sign    Days of Exercise per Week: 0 days    Minutes of Exercise per Session: 0 min  Stress: No Stress Concern Present (07/08/2022)   [employer] of Occupational Health - Occupational Stress Questionnaire    Feeling of Stress : Only a little  Social Connections: Socially Integrated (07/08/2022)   Social Connection and Isolation Panel [NHANES]    Frequency of Communication with Friends and Family: More than three times a week    Frequency of Social Gatherings with Friends and Family: Once a week    Attends Religious Services: More than 4 times per year    Active Member of [employer] or Organizations: Yes    Attends Banker Meetings: 1 to 4 times per year    Marital Status: Married  Catering manager Violence: Not At Risk (07/08/2022)   Humiliation, Afraid, Rape, and Kick questionnaire  Fear of Current or Ex-Partner: No    Emotionally Abused: No    Physically Abused: No    Sexually Abused: No   Review of Systems Some nausea--no vomiting Appetite is off    Objective:   Physical Exam Constitutional:      Appearance: Normal appearance.  HENT:     Head:     Comments: No sinus tenderness    Right Ear: Tympanic membrane and ear canal normal.     Left Ear: Tympanic membrane and ear canal normal.     Mouth/Throat:     Pharynx: No oropharyngeal exudate or posterior oropharyngeal erythema.  Pulmonary:     Effort: Pulmonary effort is normal.     Breath sounds: Normal breath sounds. No wheezing or rales.  Musculoskeletal:     Cervical back: Neck supple.  Lymphadenopathy:     Cervical: No cervical adenopathy.  Neurological:     Mental Status: She is alert.            Assessment & Plan:

## 2022-09-23 NOTE — Assessment & Plan Note (Signed)
Not overly tight--will hold off on prednisone Benzonatate for cough Continue tylenol Albuterol HFA Doxy 100 bid x 7 days

## 2022-10-01 ENCOUNTER — Ambulatory Visit: Payer: Medicare PPO | Admitting: Internal Medicine

## 2022-10-06 ENCOUNTER — Telehealth: Payer: Self-pay | Admitting: Internal Medicine

## 2022-10-06 NOTE — Telephone Encounter (Signed)
Patient husband dropped off document FMLA, to be filled out by provider. Patient husband requested to send it back via Call him to pick up within 7-days. Document is located in providers tray at front office.Please advise at Mobile [phone] (mobile) , also asked that a copy be made for him as well once forms are completed.

## 2022-10-06 NOTE — Telephone Encounter (Signed)
Patient called and said she wanted to make sure the portion the patient needs to fill out was correct. She is concerned her husband didn't fill everything out. Patient would like a call back at [phone].

## 2022-10-07 ENCOUNTER — Ambulatory Visit: Payer: Medicare PPO | Admitting: Nurse Practitioner

## 2022-10-07 ENCOUNTER — Encounter: Payer: Self-pay | Admitting: Nurse Practitioner

## 2022-10-07 ENCOUNTER — Ambulatory Visit (INDEPENDENT_AMBULATORY_CARE_PROVIDER_SITE_OTHER): Payer: Medicare PPO

## 2022-10-07 VITALS — BP 126/72 | HR 94 | Temp 98.6°F | Ht 64.0 in | Wt 315.0 lb

## 2022-10-07 DIAGNOSIS — J4541 Moderate persistent asthma with (acute) exacerbation: Secondary | ICD-10-CM

## 2022-10-07 DIAGNOSIS — J9611 Chronic respiratory failure with hypoxia: Secondary | ICD-10-CM | POA: Diagnosis not present

## 2022-10-07 DIAGNOSIS — J9612 Chronic respiratory failure with hypercapnia: Secondary | ICD-10-CM

## 2022-10-07 DIAGNOSIS — R059 Cough, unspecified: Secondary | ICD-10-CM | POA: Diagnosis not present

## 2022-10-07 MED ORDER — HYDROCODONE BIT-HOMATROP MBR 5-1.5 MG/5ML PO SOLN
5.0000 mL | Freq: Three times a day (TID) | ORAL | 0 refills | Status: DC | PRN
Start: 2022-10-07 — End: 2022-11-26

## 2022-10-07 MED ORDER — PREDNISONE 10 MG PO TABS: ORAL_TABLET | ORAL | 0 refills | Status: DC

## 2022-10-07 MED ORDER — AZITHROMYCIN 250 MG PO TABS: ORAL_TABLET | ORAL | 0 refills | Status: DC

## 2022-10-07 NOTE — Assessment & Plan Note (Signed)
Asthmatic bronchitis exacerbation. Failure to resolve with recent doxycycline course. Obtain CXR today to rule out superimposed infection. Will treat her with prednisone taper and empiric azithromycin to cover for atypical pathogens. Cough control measures advised. Action plan in place. Return precautions reviewed.  Patient Instructions  Continue BiPAP nightly  Use for a minimum 6 hours a night, with goal of using entire night.  Change equipment every 30 days or as directed by DME.  Be aware of reduced alertness and do not drive or operate heavy machinery if experiencing this or drowsiness.  -Continue nighttime supplemental O2 therapy at 1L through BiPAP -Continue 2 lpm supplemental oxygen on POC with activity. Goal oxygen >88-90%. Notify of increasing requirements.    -Continue Breztri 2 puffs Twice daily. Brush tongue and rinse mouth afterwards.  -Continue Pulmicort inhaler 2 puffs twice a day as needed for shortness of breath or wheezing  -Continue Ventolin inhaler 2 puffs as needed every 4 hours as needed for shortness of breath or wheezing  -Continue Singulair 10 mg At bedtime -Continue Flonase 2 sprays each nostril daily  -Continue Mucinex 600 mg Twice daily for chest congestion -Continue Claritin 10 mg daily for allergies   -Prednisone taper. 4 tabs for 2 days, then 3 tabs for 2 days, 2 tabs for 2 days, then 1 tab for 2 days, then stop. Take in AM with food -Azithromycin - take 2 tabs on day one then 1 tab daily for four additional days -Hycodan cough syrup 5 mL every 8 hours as needed for severe coughing. Do not drive after taking. May cause drowsiness and increase risk for fall. Do not take within 4 hours of taking oxycodone -Continue benzonatate 1 capsule Three times a day for cough  Chest x ray today    Follow up in 4 weeks with Dr. Wynona Neat or Philis Nettle. If symptoms do not improve or worsen, please contact office for sooner follow up or seek emergency care

## 2022-10-07 NOTE — Assessment & Plan Note (Signed)
Stable without increased oxygen requirements. She is able to maintain on room air at rest while in office today. No desaturations noted at home. Goal >88-90%

## 2022-10-07 NOTE — Progress Notes (Signed)
@Patient  ID: April [name], female    DOB: [birth year], 72 y.o.   MRN: [id number]  Chief Complaint  Patient presents with   Follow-up    Pt is here for F/U visit. Pt coughing with yellow mucus    Referring provider: Pincus Sanes, MD  HPI: 71 year old female, never smoker followed for nocturnal hypoxemia, pulmonary hypertension, obesity hypoventilation syndrome and asthma.  She is a patient of Dr. Trena Platt and last seen in office 06/03/2022 by Allison Quarry NP.  Past medical history significant for CHF, former DVT on chronic anticoagulation, hypertension, CAD, GERD, PCP, DM2, morbid obesity, CKD stage III, HLD  TEST/EVENTS:  05/07/2020 CTA chest: Mild cardiomegaly suggestive of PA hypertension.  Atherosclerosis.  No lymphadenopathy.  Minimal groundglass opacities in the lung bases.  There is slight suboptimal opacification of main PA without central or proximal segmental PE.  Small hiatal hernia 11/18/2020 echocardiogram: EF 65 to 70%.  LVH.  G1 DD.  The interventricular septum is flattened consistent with RV pressure and volume overload 11/19/2020 right heart cath: Mild to moderate pulmonary hypertension there is minimally elevated PC WP.  Normal CO 12/09/2020 split-night study: Negative for OSA.  Nocturnal desaturations to 84% requiring 1 L/min O2.  Recommended continued use of BiPAP for CO2 blow off and 1 L supplemental O2 at night 04/04/2021 CXR 2 view: Frontal and lateral views of the chest were unremarkable.  There was no acute airspace disease noted. 08/29/2021 CXR: vascular congestion. Lower lung interstitial opacities suggestive of edema   10/02/2020: OV with Dr. Wynona Neat. Scheduled in lab split-night. Lunesta low dose for sleep. Weight loss advised but limits with severe arthritis. Encouraged to decrease naps and consolidate sleep at night. F/u in 3-4 months.    11/15/2020 - 11/23/2018: Hospital admission for acute on chronic CHF exacerbation and acute on chronic hypoxic hypercapnic respiratory  failure.  pH 7.29 PCO2 of 80 and PO2 of 60. IV diuresis with Lasix.  Right heart cath on 10/19.  PCCM consulted with recommendations for BiPAP versus NIPPV upon discharge.   01/03/2021: OV with Asriel Westrup NP for follow up after sleep study and overdue hospital follow up. Breathing stable. Continued on BiPAP and supplemental oxygen at night 1 lpm. Continued on Symbicort for maintenance. PRN Ventolin. Singulair at bedtime.   04/04/2021: OV with Angelette Ganus NP for follow-up.  Felt like she had been having to use her oxygen more during the day and not just at night.  Progressive DOE.  Consistently wearing 2 L/min with activity; reports she has been doing this for the past few months.  Started after we saw her last.  Continues to wear BiPAP at night along with 1 L/min supplemental O2.  She was concerned that her asthma was flaring as she had not had any increased cough over the past week.  Suspected that DOE was multifactorial related to CHF, obesity, deconditioning and slight asthma component.  She was unable to complete FeNO however there was no significant bronchospasm on exam.  Trialed step up in therapy to [facility].  Continued Singulair at night.  Added Claritin for trigger prevention.  Flonase for postnasal drainage control.  And Delsym and Tessalon Perles for cough control.  Suspected that there was an upper airway component to her cough.  Walking oximetry with desaturations to 84% on room air.  She was placed on 3 L/min POC and maintain saturations in the 90s. CXR and labs unremarkable.   05/16/2021: OV with Ulis Kaps NP for follow-up.  Overall her breathing has been relatively  stable and at baseline.  Feels like she did notice a improvement with the Breztri.  She ran out of samples and went back to her Symbicort.  Rx sent for [employer]. Cough is overall stable as well.  Relatively infrequent and usually dry.  Her biggest complaint was that she fell at the end of March and has had generalized pain since.  She does have a place in  her groin where she landed on her cane that is giving her the most trouble.  She is [facility] after this visit today to discuss this.  She denies any lightheaded or dizziness.  No loss of consciousness with fall.  Allergies seem relatively well controlled.  Does still have occasional postnasal drip but improved with her changes last time.  She continues on BiPAP nightly.  She has yet to receive her POC from her DME company.  She has been using her oxygen concentrator at home.  She is on room air currently in office with oxygen saturations in the 90s.  She is usually able to maintain saturations at rest but drops to the 80s with activity - qualified for POC 3 lpm at OV so orders sent to DME.    08/15/2021: OV with Malyia Moro NP for follow up with her husband. She reports that her breathing has been stable. She is feeling well on the [place]. She still occasionally has some wheezing but usually clears on it's own. She rarely has to use her albuterol. She gets winded with long distances and climbing, and is normally relatively sedentary; this is unchanged from her baseline and some is related to her chronic knee pain. She is actively trying to lose weight and is down about 20 pounds. Allergies are well controlled. She wears her BiPAP nightly. She finally got her POC machine this morning and has herself set to 2 lpm which she is 98% on. Rare use of pulmicort or albuterol inhalers. No concerns or complaints today.   11/21/2021: OV with Dr. Wynona Neat. Continues to do well. Some weight loss. Denies any significant symptoms at present. She continues to use [employer]. She is using BiPAP nightly for hypercapnic respiratory failure. Oxygen still drops with activity.   05/04/2022: OV with Toby Ayad NP for follow up with her husband. She was doing pretty well up until the last few weeks. She's been having some more trouble with her breathing and chest tightness. She's also been feeling like her sinuses are congested,  having some occasional headaches, and dealing with a cough that is minimally productive at times with yellow phlegm. She is having similar colored nasal drainage. She thinks it is all related to her allergies. She has noticed an occasional wheeze. She is also worried about a tender and more red spot on her right lower leg. She first noticed this around a week ago. Denies any drainage, fevers, chills, increased leg swelling. No weight gain, orthopnea, PND. No known sick exposures. She has not missed any doses of her Eliquis. Eating and drinking well. She is using her Breztri twice a day, taking singulair and using flonase. She has not been on Claritin as she ran out. She has not been using her rescue inhalers but she has been using her oxygen more. She admits to decreased use of BiPAP as she prefers to wear her oxygen at night.   06/03/2022: OV with Aracelia Brinson NP for follow up. Feeling better after our last visit. Still has some occasional cough with clear to white phlegm.  Chest and sinus congestion are better. Usually depends on the day and the pollen. Denies any fevers, chills, hemoptysis, increased leg swelling, orthopnea. She was told by her DME company that she needs to repeat her oxygen qualification. She's not sure why because she just got her POC in July of last year. She has a BiPAP, which is paid off. Not sleeping with it because she prefers the oxygen.   10/07/2022: Today - follow up Patient presents today for follow-up with her husband but is actually struggling with some acute symptoms.  She tells me that at the end of July/beginning of August she had a viral illness.  She thinks she had COVID.  She did lose her taste and smell.  Did not complete any viral testing.  At the time of onset, she had sinus congestion and drainage, cough, fevers and chills.  She did not have much of an appetite.  She slowly improved but has still had a persistent cough with chest congestion.  She is producing some yellow phlegm.   Because of this she went to go see her primary care provider on 8/21.  They prescribed a course of doxycycline, which she completed about a week ago and benzonatate for cough control.  Unfortunately, she is not feeling much better.  She does feeling the benzonatate helps some with the cough but is still producing white to yellow phlegm.  She does feel a little more short winded than she usually does.  Has noticed some more wheezing.  Has not had any recurrence of fevers.  Denies any chills, hemoptysis, increased lower extremity swelling, weight gain.  Sinus symptoms have mostly resolved.  Still has some clear drainage from her nose.  Her appetite does seem to finally be returning.  She starting to be able to smell things again as well.  Tried Robitussin DM over-the-counter but does not seem to be doing much for her cough either.  She is still using her Breztri and Singulair.  Will take Pulmicort a few times a week.  Allergies  Allergen Reactions   Penicillins Rash and Other (See Comments)    She was told not to take it anymore. Has patient had a PCN reaction causing immediate rash, facial/tongue/throat swelling, SOB or lightheadedness with hypotension: Yes Has patient had a PCN reaction causing severe rash involving mucus membranes or skin necrosis: No Has patient had a PCN reaction that required hospitalization: No Has patient had a PCN reaction occurring within the last 10 years: No If all of the above answers are "NO", then may proceed with Cephalosporin use.'   Shellfish Allergy Anaphylaxis   Sulfonamide Derivatives Anaphylaxis and Other (See Comments)   Hydrochlorothiazide W-Triamterene Other (See Comments)    Hypokalemia   Lotensin [Benazepril Hcl] Hives   Dipyridamole Other (See Comments)    Unknown reaction   Estrogens Other (See Comments)    Unknown reaction   Hydrochlorothiazide Other (See Comments)    Unknown reaction, Hypokalemia?   Latex Other (See Comments)    Unknown reaction -  stated previously during surgery gloves had to be changed, but unsure if it is still an allergy or not.    Metronidazole Other (See Comments)    Unknown reaction   Other Itching    PICKLES   Spironolactone Other (See Comments)    UNSPECIFIED > "kidney problems"   Torsemide Other (See Comments)    Unknown reaction   Valsartan Other (See Comments)    Unknown reaction   Mustard [Allyl Isothiocyanate] Itching  Immunization History  Administered Date(s) Administered   Fluad Quad(high Dose 65+) 10/28/2018, 10/18/2020, 11/07/2021   Influenza Split 12/03/2010, 11/03/2011   Influenza Whole 11/16/2006, 11/22/2007, 10/31/2008, 10/29/2009   Influenza, High Dose Seasonal PF 10/10/2015, 11/06/2016, 11/16/2017, 11/08/2019   Influenza,inj,Quad PF,6+ Mos 10/19/2012, 10/25/2013, 10/29/2014   Influenza-Unspecified 10/23/2016, 11/18/2016   Moderna Sars-Covid-2 Vaccination 03/13/2019, 04/10/2019, 12/06/2019   PFIZER Comirnaty(Gray Top)Covid-19 Tri-Sucrose Vaccine 11/14/2021   Pfizer Covid-19 Vaccine Bivalent Booster 47yrs & up 11/08/2020   Pneumococcal Conjugate-13 06/07/2015   Pneumococcal Polysaccharide-23 03/24/2005, 01/04/2012, 06/29/2017   Td 06/24/2009   Tdap 10/09/2014   Zoster Recombinant(Shingrix) 08/11/2016, 10/23/2016   Zoster, Live 01/15/2012    Past Medical History:  Diagnosis Date   Anemia    Asthma    CAD (coronary artery disease)    Carpal tunnel syndrome    Cellulitis of both lower extremities 04/11/2015   CHF (congestive heart failure) (HCC)    Chronic kidney disease    CKI- followed by [facility]   Colon polyp, hyperplastic 2007 & 2012   Complication of anesthesia 1999   svt with renal calculi surgery, no problems since   CTS (carpal tunnel syndrome)    bilateral   Diabetes mellitus    Eczema    GERD (gastroesophageal reflux disease)    History of kidney stones 1999   Hyperlipidemia    Hypertension    Leg ulcer (HCC) 04/24/2015   Right lateral leg No  evidence of an infection Monitor closely Keep edema controlled    Leg ulcer (HCC)    right lower   Meralgia paresthetica    Dr. Terrace Arabia   Morbid obesity (HCC)    Neuropathy    toes and legs   Osteoarthrosis, unspecified whether generalized or localized, lower leg    knee   PUD (peptic ulcer disease)    Shortness of breath dyspnea    with exertion   Sleep apnea    per progress note 02/25/2018   Type II or unspecified type diabetes mellitus without mention of complication, not stated as uncontrolled    Unspecified hereditary and idiopathic peripheral neuropathy    Urticaria    Vitamin B12 deficiency    Wears glasses    Wound cellulitis    right upper leg, healing well    Tobacco History: Social History   Tobacco Use  Smoking Status Never  Smokeless Tobacco Never   Counseling given: Not Answered   Outpatient Medications Prior to Visit  Medication Sig Dispense Refill   acetaminophen (TYLENOL) 650 MG CR tablet Take 1,300 mg by mouth every 8 (eight) hours as needed for pain.     Alcohol Swabs (DROPSAFE ALCOHOL PREP) 70 % PADS USE TOPICALLY AS DIRECTED 300 each 3   allopurinol (ZYLOPRIM) 100 MG tablet TAKE 2 TABLETS(200 MG) BY MOUTH DAILY 180 tablet 1   apixaban (ELIQUIS) 2.5 MG TABS tablet Take 1 tablet (2.5 mg total) by mouth 2 (two) times daily. 180 tablet 1   atorvastatin (LIPITOR) 40 MG tablet TAKE 1 TABLET(40 MG) BY MOUTH AT BEDTIME 90 tablet 2   benzonatate (TESSALON) 200 MG capsule Take 1 capsule (200 mg total) by mouth 3 (three) times daily as needed for cough. 60 capsule 0   Blood Glucose Calibration (TRUE METRIX LEVEL 1) Low SOLN UAD  E11.9 1 each 3   Blood Glucose Monitoring Suppl (TRUE METRIX METER) DEVI True Metrix Level 1 solution     Budeson-Glycopyrrol-Formoterol (BREZTRI AEROSPHERE) 160-9-4.8 MCG/ACT AERO Inhale 2 puffs into the lungs in the  morning and at bedtime. 32.1 g 3   calcium carbonate (TUMS - DOSED IN MG ELEMENTAL CALCIUM) 500 MG chewable tablet Chew 2  tablets by mouth daily as needed for indigestion or heartburn.     cyanocobalamin (,VITAMIN B-12,) 1000 MCG/ML injection INJECT INTO MUSCLE EVERY 30 DAYS 10 mL 5   dapagliflozin propanediol (FARXIGA) 10 MG TABS tablet Take 10 mg by mouth daily.     diclofenac Sodium (VOLTAREN) 1 % GEL Apply 1 application topically as needed (pain).     diphenhydrAMINE (BENADRYL) 12.5 MG/5ML liquid Take 25 mg by mouth 4 (four) times daily as needed for allergies.     doxycycline (VIBRA-TABS) 100 MG tablet Take 1 tablet (100 mg total) by mouth 2 (two) times daily. 14 tablet 0   famotidine (PEPCID) 40 MG tablet Take 1 tablet (40 mg total) by mouth daily. 180 tablet 3   FEROSUL 325 (65 Fe) MG tablet TAKE 1 TABLET BY MOUTH EVERY DAY ON MONDAY, WEDNESDAY, FRIDAYS 60 tablet 0   fluticasone (FLONASE) 50 MCG/ACT nasal spray Place 2 sprays into both nostrils daily. 18.2 mL 2   gabapentin (NEURONTIN) 100 MG capsule TAKE 1 CAPSULE(100 MG) BY MOUTH THREE TIMES DAILY 90 capsule 1   guaiFENesin-dextromethorphan (ROBITUSSIN DM) 100-10 MG/5ML syrup Take 5 mLs by mouth every 4 (four) hours as needed for cough.     hydrOXYzine (ATARAX) 25 MG tablet Take 1 tablet (25 mg total) by mouth 3 (three) times daily as needed for itching. 90 tablet 1   montelukast (SINGULAIR) 10 MG tablet TAKE 1 TABLET(10 MG) BY MOUTH AT BEDTIME 30 tablet 5   nebivolol (BYSTOLIC) 5 MG tablet TAKE 1 TABLET(5 MG) BY MOUTH DAILY 30 tablet 3   ondansetron (ZOFRAN) 4 MG tablet Take 1 tablet (4 mg total) by mouth every 8 (eight) hours as needed for nausea or vomiting. 20 tablet 0   Oxcarbazepine (TRILEPTAL) 300 MG tablet Take 1 tablet (300 mg total) by mouth 2 (two) times daily. 180 tablet 1   oxyCODONE (OXY IR/ROXICODONE) 5 MG immediate release tablet Take 1 tablet (5 mg total) by mouth every 6 (six) hours as needed for severe pain (Chronic knee osteoarthritis). 20 tablet 0   polyethylene glycol (MIRALAX / GLYCOLAX) 17 g packet Take 17 g by mouth daily as needed  for moderate constipation.     potassium chloride SA (KLOR-CON M) 20 MEQ tablet Take 1 tablet (20 mEq total) by mouth 2 (two) times daily. 180 tablet 3   Spacer/Aero-Holding [facility] Use with inhaler. 1 each 2   spironolactone (ALDACTONE) 25 MG tablet TAKE 1 TABLET(25 MG) BY MOUTH DAILY 90 tablet 0   tirzepatide (MOUNJARO) 15 MG/0.5ML Pen Inject 15 mg into the skin once a week. 6 mL 2   torsemide (DEMADEX) 20 MG tablet Take 40 mg in AM and 20 mg in PM 360 tablet 3   triamcinolone ointment (KENALOG) 0.5 % Apply 1 Application topically as needed (knee pain).     VENTOLIN HFA 108 (90 Base) MCG/ACT inhaler Inhale 2 puffs into the lungs every 4 (four) hours as needed for wheezing.     eszopiclone (LUNESTA) 1 MG TABS tablet Take 1 mg by mouth at bedtime as needed for sleep. Take immediately before bedtime     loratadine (CLARITIN) 10 MG tablet Take 1 tablet (10 mg total) by mouth daily. 30 tablet 5   No facility-administered medications prior to visit.     Review of Systems:   Constitutional: No weight  loss or gain, night sweats, fevers, chills, +fatigue (improving) HEENT: No headaches, difficulty swallowing, tooth/dental problems, or sore throat. No sneezing, itching, ear ache. +improved nasal congestion/drainage CV:  No chest pain, orthopnea, PND, swelling in lower extremities, anasarca, dizziness, palpitations, syncope Resp: +shortness of breath with exertion; productive cough; occasional wheeze. No hemoptysis.  No chest wall deformity GI:  No heartburn, indigestion, abdominal pain, nausea, vomiting, diarrhea, change in bowel habits, loss of appetite, bloody stools.  GU: No dysuria, change in color of urine, urgency or frequency.   Skin: No rash.  MSK:  +knee pain (chronic). No joint swelling.  No decreased range of motion.  No back pain. Neuro: No dizziness or lightheadedness.  Psych: No depression or anxiety. Mood stable.     Physical Exam:  BP 126/72 (BP Location: Left Wrist,  Cuff Size: Large)   Pulse 94   Temp 98.6 F (37 C)   Ht 5\' 4"  (1.626 m)   Wt (!) 315 lb (142.9 kg)   SpO2 94%   BMI 54.07 kg/m   GEN: Pleasant, interactive, chronically-ill appearing; morbidly obese; in no acute distress. HEENT:  Normocephalic and atraumatic. PERRLA. Sclera white. Nasal turbinates pale, moist and patent bilaterally. No rhinorrhea present. Oropharynx pink and moist, without exudate or edema. No lesions, ulcerations NECK:  Supple w/ fair ROM. No JVD present.  CV: RRR, no m/r/g, no peripheral edema. Pulses intact, +2 bilaterally. No cyanosis, pallor or clubbing. PULMONARY:  Unlabored, regular breathing. Scattered rhonchi with end expiratory wheeze bilaterally A&P. No accessory muscle use. No dullness to percussion. GI: BS present and normoactive. Soft, non-tender to palpation.  MSK: No erythema, warmth or tenderness to left knee. Cap refil <2 sec all extrem. No deformities or joint swelling noted.  Neuro: A/Ox3. No focal deficits noted.   Skin: Intact, warm. Psych: Normal affect and behavior. Judgement and thought content appropriate.     Lab Results:  CBC    Component Value Date/Time   WBC 5.1 08/12/2022 1008   WBC 6.8 07/03/2022 0851   RBC 4.12 08/12/2022 1008   HGB 12.8 08/12/2022 1008   HCT 41.4 08/12/2022 1008   PLT 157 08/12/2022 1008   MCV 100.5 (H) 08/12/2022 1008   MCH 31.1 08/12/2022 1008   MCHC 30.9 08/12/2022 1008   RDW 15.1 08/12/2022 1008   LYMPHSABS 1.2 08/12/2022 1008   MONOABS 0.4 08/12/2022 1008   EOSABS 0.1 08/12/2022 1008   BASOSABS 0.1 08/12/2022 1008    BMET    Component Value Date/Time   NA 142 08/12/2022 1008   NA 145 (H) 10/25/2012 1117   K 4.2 08/12/2022 1008   CL 102 08/12/2022 1008   CO2 35 (H) 08/12/2022 1008   GLUCOSE 85 08/12/2022 1008   BUN 18 08/12/2022 1008   BUN 24 10/25/2012 1117   CREATININE 1.72 (H) 08/12/2022 1008   CALCIUM 9.2 08/12/2022 1008   GFRNONAA 31 (L) 08/12/2022 1008   GFRAA 44 (L) 02/28/2019 0252     BNP    Component Value Date/Time   BNP 34.2 09/03/2021 1026     Imaging:  No results found.  cyanocobalamin (VITAMIN B12) injection 1,000 mcg     Date Action Dose Route User   08/17/2022 1531 Given 1,000 mcg Intramuscular (Right Deltoid) Aundra Millet, CMA      cyanocobalamin (VITAMIN B12) injection 1,000 mcg     Date Action Dose Route User   09/18/2022 1550 Given 1,000 mcg Intramuscular (Left Deltoid) Levonne Lapping, CMA  No data to display          No results found for: "NITRICOXIDE"      Assessment & Plan:   Asthmatic bronchitis with acute exacerbation Asthmatic bronchitis exacerbation. Failure to resolve with recent doxycycline course. Obtain CXR today to rule out superimposed infection. Will treat her with prednisone taper and empiric azithromycin to cover for atypical pathogens. Cough control measures advised. Action plan in place. Return precautions reviewed.  Patient Instructions  Continue BiPAP nightly  Use for a minimum 6 hours a night, with goal of using entire night.  Change equipment every 30 days or as directed by DME.  Be aware of reduced alertness and do not drive or operate heavy machinery if experiencing this or drowsiness.  -Continue nighttime supplemental O2 therapy at 1L through BiPAP -Continue 2 lpm supplemental oxygen on POC with activity. Goal oxygen >88-90%. Notify of increasing requirements.    -Continue Breztri 2 puffs Twice daily. Brush tongue and rinse mouth afterwards.  -Continue Pulmicort inhaler 2 puffs twice a day as needed for shortness of breath or wheezing  -Continue Ventolin inhaler 2 puffs as needed every 4 hours as needed for shortness of breath or wheezing  -Continue Singulair 10 mg At bedtime -Continue Flonase 2 sprays each nostril daily  -Continue Mucinex 600 mg Twice daily for chest congestion -Continue Claritin 10 mg daily for allergies   -Prednisone taper. 4 tabs for 2 days, then 3 tabs for 2  days, 2 tabs for 2 days, then 1 tab for 2 days, then stop. Take in AM with food -Azithromycin - take 2 tabs on day one then 1 tab daily for four additional days -Hycodan cough syrup 5 mL every 8 hours as needed for severe coughing. Do not drive after taking. May cause drowsiness and increase risk for fall. Do not take within 4 hours of taking oxycodone -Continue benzonatate 1 capsule Three times a day for cough  Chest x ray today    Follow up in 4 weeks with Dr. Wynona Neat or Philis Nettle. If symptoms do not improve or worsen, please contact office for sooner follow up or seek emergency care   Chronic respiratory failure with hypoxia and hypercapnia (HCC) Stable without increased oxygen requirements. She is able to maintain on room air at rest while in office today. No desaturations noted at home. Goal >88-90%     I spent 35 minutes of dedicated to the care of this patient on the date of this encounter to include pre-visit review of records, face-to-face time with the patient discussing conditions above, post visit ordering of testing, clinical documentation with the electronic health record, making appropriate referrals as documented, and communicating necessary findings to members of the patients care team.  Noemi Chapel, NP 10/07/2022  Pt aware and understands NP's role.

## 2022-10-07 NOTE — Patient Instructions (Addendum)
Continue BiPAP nightly  Use for a minimum 6 hours a night, with goal of using entire night.  Change equipment every 30 days or as directed by DME.  Be aware of reduced alertness and do not drive or operate heavy machinery if experiencing this or drowsiness.  -Continue nighttime supplemental O2 therapy at 1L through BiPAP -Continue 2 lpm supplemental oxygen on POC with activity. Goal oxygen >88-90%. Notify of increasing requirements.    -Continue Breztri 2 puffs Twice daily. Brush tongue and rinse mouth afterwards.  -Continue Pulmicort inhaler 2 puffs twice a day as needed for shortness of breath or wheezing  -Continue Ventolin inhaler 2 puffs as needed every 4 hours as needed for shortness of breath or wheezing  -Continue Singulair 10 mg At bedtime -Continue Flonase 2 sprays each nostril daily  -Continue Mucinex 600 mg Twice daily for chest congestion -Continue Claritin 10 mg daily for allergies   -Prednisone taper. 4 tabs for 2 days, then 3 tabs for 2 days, 2 tabs for 2 days, then 1 tab for 2 days, then stop. Take in AM with food -Azithromycin - take 2 tabs on day one then 1 tab daily for four additional days -Hycodan cough syrup 5 mL every 8 hours as needed for severe coughing. Do not drive after taking. May cause drowsiness and increase risk for fall. Do not take within 4 hours of taking oxycodone -Continue benzonatate 1 capsule Three times a day for cough  Chest x ray today    Follow up in 4 weeks with Dr. Wynona Neat or Philis Nettle. If symptoms do not improve or worsen, please contact office for sooner follow up or seek emergency care

## 2022-10-07 NOTE — Telephone Encounter (Signed)
Received and will call patient once completed.

## 2022-10-08 ENCOUNTER — Telehealth: Payer: Self-pay | Admitting: Nurse Practitioner

## 2022-10-08 ENCOUNTER — Other Ambulatory Visit: Payer: Self-pay | Admitting: Internal Medicine

## 2022-10-08 ENCOUNTER — Telehealth: Payer: Self-pay | Admitting: Internal Medicine

## 2022-10-08 DIAGNOSIS — J4541 Moderate persistent asthma with (acute) exacerbation: Secondary | ICD-10-CM

## 2022-10-08 MED ORDER — BENZONATATE 200 MG PO CAPS
200.0000 mg | ORAL_CAPSULE | Freq: Three times a day (TID) | ORAL | 1 refills | Status: DC | PRN
Start: 2022-10-08 — End: 2023-01-08

## 2022-10-08 NOTE — Telephone Encounter (Signed)
Patient wants to know if PCP thinks she should get a Covid booster. She would like a call back at [phone].

## 2022-10-08 NOTE — Telephone Encounter (Signed)
Results have been relayed to the patient/authorized caretaker. The patient/authorized caretaker verbalized understanding. No questions at this time.   

## 2022-10-08 NOTE — Telephone Encounter (Signed)
Patient is calling wanting to know the results of her x-ray. [phone]  Benzonatate 200mg  tablets need to be called in as well. She is running low. She states that she takes it three times a day for cough.  Pharmacy [facility] on [address]

## 2022-10-08 NOTE — Telephone Encounter (Signed)
Chest x ray was clear. I've sent in refill of the benzonatate

## 2022-10-08 NOTE — Telephone Encounter (Signed)
Spoke with patient today. 

## 2022-10-12 NOTE — Telephone Encounter (Signed)
Form completed and placed in Dr. Lawerance Bach folder for review.

## 2022-10-13 ENCOUNTER — Other Ambulatory Visit (HOSPITAL_COMMUNITY): Payer: Self-pay | Admitting: Family Medicine

## 2022-10-13 ENCOUNTER — Other Ambulatory Visit: Payer: Self-pay | Admitting: Internal Medicine

## 2022-10-13 ENCOUNTER — Telehealth: Payer: Self-pay | Admitting: Internal Medicine

## 2022-10-13 NOTE — Telephone Encounter (Signed)
Already filled today by electronic request from pharmacy.

## 2022-10-13 NOTE — Telephone Encounter (Signed)
Form complete and patient informed of pick up.  Form left up front.

## 2022-10-13 NOTE — Telephone Encounter (Signed)
Prescription Request  10/13/2022  LOV: 07/03/2022  What is the name of the medication or equipment?   FEROSUL 325 (65 Fe) MG tablet  potassium chloride SA (KLOR-CON M) 20 MEQ tablet  Have you contacted your pharmacy to request a refill? No   Which pharmacy would you like this sent to?     [id number] - [place], [place] - [address] AT [address] [place] [address] Phone: [phone] Fax: [phone]  Patient notified that their request is being sent to the clinical staff for review and that they should receive a response within 2 business days.   Please advise at Mobile [phone] (mobile)

## 2022-10-17 ENCOUNTER — Other Ambulatory Visit: Payer: Self-pay | Admitting: Internal Medicine

## 2022-10-17 DIAGNOSIS — I5033 Acute on chronic diastolic (congestive) heart failure: Secondary | ICD-10-CM | POA: Diagnosis not present

## 2022-10-17 DIAGNOSIS — J9601 Acute respiratory failure with hypoxia: Secondary | ICD-10-CM | POA: Diagnosis not present

## 2022-10-17 DIAGNOSIS — J9602 Acute respiratory failure with hypercapnia: Secondary | ICD-10-CM | POA: Diagnosis not present

## 2022-10-21 ENCOUNTER — Ambulatory Visit (INDEPENDENT_AMBULATORY_CARE_PROVIDER_SITE_OTHER): Payer: Medicare PPO

## 2022-10-21 DIAGNOSIS — E538 Deficiency of other specified B group vitamins: Secondary | ICD-10-CM

## 2022-10-21 MED ORDER — CYANOCOBALAMIN 1000 MCG/ML IJ SOLN
1000.0000 ug | Freq: Once | INTRAMUSCULAR | Status: AC
Start: 2022-10-21 — End: 2022-10-21
  Administered 2022-10-21: 1000 ug via INTRAMUSCULAR

## 2022-10-21 NOTE — Progress Notes (Signed)
Pt received her B12 shot in her right arm. Pt responded well

## 2022-10-23 ENCOUNTER — Ambulatory Visit: Payer: Medicare PPO | Admitting: Podiatry

## 2022-10-23 ENCOUNTER — Ambulatory Visit: Payer: Medicare PPO | Admitting: Podiatrist

## 2022-11-02 ENCOUNTER — Telehealth: Payer: Self-pay | Admitting: Internal Medicine

## 2022-11-02 MED ORDER — SPIRONOLACTONE 25 MG PO TABS: ORAL_TABLET | ORAL | 1 refills | Status: DC

## 2022-11-02 MED ORDER — GABAPENTIN 100 MG PO CAPS: ORAL_CAPSULE | ORAL | 1 refills | Status: DC

## 2022-11-02 NOTE — Telephone Encounter (Signed)
Prescription Request  11/02/2022  LOV: 07/03/2022  What is the name of the medication or equipment? spironolactone (ALDACTONE) 25 MG tablet   gabapentin (NEURONTIN) 100 MG capsule   Have you contacted your pharmacy to request a refill? No   Which pharm [id number] - [place], [place] - [address] [place] [address] Phone: [phone] Fax: [phone]   Patient notified that their request is being sent to the clinical staff for review and that they should receive a response within 2 business days.   Please advise at Mobile [phone] (mobile)

## 2022-11-02 NOTE — Addendum Note (Signed)
Addended by: Pincus Sanes on: 11/02/2022 12:47 PM   Modules accepted: Orders

## 2022-11-05 ENCOUNTER — Telehealth: Payer: Self-pay | Admitting: Nurse Practitioner

## 2022-11-05 NOTE — Telephone Encounter (Signed)
Patient is calling. [employer] needs a new prescription for her inhaler so that it can be filled.

## 2022-11-06 ENCOUNTER — Ambulatory Visit: Payer: Medicare PPO

## 2022-11-06 DIAGNOSIS — R519 Headache, unspecified: Secondary | ICD-10-CM | POA: Diagnosis not present

## 2022-11-06 DIAGNOSIS — H04123 Dry eye syndrome of bilateral lacrimal glands: Secondary | ICD-10-CM | POA: Diagnosis not present

## 2022-11-09 ENCOUNTER — Other Ambulatory Visit: Payer: Self-pay | Admitting: Nurse Practitioner

## 2022-11-09 ENCOUNTER — Ambulatory Visit: Payer: Medicare PPO

## 2022-11-09 ENCOUNTER — Telehealth (HOSPITAL_COMMUNITY): Payer: Self-pay | Admitting: Cardiology

## 2022-11-09 ENCOUNTER — Other Ambulatory Visit: Payer: Self-pay

## 2022-11-09 DIAGNOSIS — I5032 Chronic diastolic (congestive) heart failure: Secondary | ICD-10-CM

## 2022-11-09 DIAGNOSIS — J4541 Moderate persistent asthma with (acute) exacerbation: Secondary | ICD-10-CM

## 2022-11-09 MED ORDER — BREZTRI AEROSPHERE 160-9-4.8 MCG/ACT IN AERO
2.0000 | INHALATION_SPRAY | Freq: Two times a day (BID) | RESPIRATORY_TRACT | 12 refills | Status: DC
Start: 2022-11-09 — End: 2022-11-26

## 2022-11-09 MED ORDER — METOLAZONE 2.5 MG PO TABS: 2.5000 mg | ORAL_TABLET | Freq: Every day | ORAL | 0 refills | Status: DC

## 2022-11-09 NOTE — Telephone Encounter (Signed)
Patient called to report BLE and weight gain  Reports she doubled torsemide to 40 BID with no effect on weight and swelling  Reports compliance with regular medications and fluid restrictions  Weight 9/23-319 Weight 10/7-325   Please advise

## 2022-11-09 NOTE — Telephone Encounter (Signed)
Pt aware.

## 2022-11-12 ENCOUNTER — Ambulatory Visit: Payer: Medicare PPO | Admitting: Radiology

## 2022-11-12 DIAGNOSIS — Z23 Encounter for immunization: Secondary | ICD-10-CM

## 2022-11-12 NOTE — Progress Notes (Signed)
Patient here for HD flu shot and tolerate well with no complicaitons

## 2022-11-13 NOTE — Telephone Encounter (Signed)
Called and spoke with pt who states she has received a couple letters about pt assistance paperwork needing to be filled out for her Breztri.  The paperwork from [employer] will need to be printed and filled out for pt's Breztri for the pt assistance and then faxed to them.  Once this has been done, patient will need to be contacted for an update to let her know this was done.  Due to me not being in the office, routing to Florentina Addison so she can get her assistant to fill out [employer] paperwork for pt's Breztri.

## 2022-11-16 ENCOUNTER — Ambulatory Visit (HOSPITAL_COMMUNITY)
Admission: RE | Admit: 2022-11-16 | Discharge: 2022-11-16 | Disposition: A | Discharge status: Home / Self Care | Payer: Medicare PPO | Source: Ambulatory Visit | Attending: Cardiology | Admitting: Cardiology

## 2022-11-16 ENCOUNTER — Other Ambulatory Visit: Payer: Self-pay | Admitting: Internal Medicine

## 2022-11-16 ENCOUNTER — Telehealth: Payer: Self-pay | Admitting: Internal Medicine

## 2022-11-16 DIAGNOSIS — I5032 Chronic diastolic (congestive) heart failure: Secondary | ICD-10-CM | POA: Diagnosis not present

## 2022-11-16 DIAGNOSIS — J9602 Acute respiratory failure with hypercapnia: Secondary | ICD-10-CM | POA: Diagnosis not present

## 2022-11-16 DIAGNOSIS — J9601 Acute respiratory failure with hypoxia: Secondary | ICD-10-CM | POA: Diagnosis not present

## 2022-11-16 DIAGNOSIS — I5033 Acute on chronic diastolic (congestive) heart failure: Secondary | ICD-10-CM | POA: Diagnosis not present

## 2022-11-16 LAB — BASIC METABOLIC PANEL
Anion gap: 12 (ref 5–15)
BUN: 32 mg/dL — ABNORMAL HIGH (ref 8–23)
CO2: 33 mmol/L — ABNORMAL HIGH (ref 22–32)
Calcium: 9.4 mg/dL (ref 8.9–10.3)
Chloride: 93 mmol/L — ABNORMAL LOW (ref 98–111)
Creatinine, Ser: 2.15 mg/dL — ABNORMAL HIGH (ref 0.44–1.00)
GFR, Estimated: 24 mL/min — ABNORMAL LOW (ref >60)
Glucose, Bld: 91 mg/dL (ref 70–99)
Potassium: 3.6 mmol/L (ref 3.5–5.1)
Sodium: 138 mmol/L (ref 135–145)

## 2022-11-16 NOTE — Telephone Encounter (Signed)
Prescription Request  11/16/2022  LOV: 07/03/2022  What is the name of the medication or equipment? torsemide (DEMADEX) 20 MG tablet  apixaban (ELIQUIS) 2.5 MG TABS tablet    Have you contacted your pharmacy to request a refill? No   Which pharmacy would you like this sent to?     [facility] #[id number] - [place],  - [address] [place] [address] Phone: [phone] Fax: [phone]  Patient notified that their request is being sent to the clinical staff for review and that they should receive a response within 2 business days.   Please advise at Mobile [phone] (mobile)

## 2022-11-17 NOTE — Telephone Encounter (Signed)
Refills requested by pharmacy and sent in already this morning.

## 2022-11-19 DIAGNOSIS — N189 Chronic kidney disease, unspecified: Secondary | ICD-10-CM | POA: Diagnosis not present

## 2022-11-19 DIAGNOSIS — N184 Chronic kidney disease, stage 4 (severe): Secondary | ICD-10-CM | POA: Diagnosis not present

## 2022-11-19 DIAGNOSIS — J40 Bronchitis, not specified as acute or chronic: Secondary | ICD-10-CM | POA: Diagnosis not present

## 2022-11-19 DIAGNOSIS — N2581 Secondary hyperparathyroidism of renal origin: Secondary | ICD-10-CM | POA: Diagnosis not present

## 2022-11-19 DIAGNOSIS — E1122 Type 2 diabetes mellitus with diabetic chronic kidney disease: Secondary | ICD-10-CM | POA: Diagnosis not present

## 2022-11-19 DIAGNOSIS — D631 Anemia in chronic kidney disease: Secondary | ICD-10-CM | POA: Diagnosis not present

## 2022-11-19 DIAGNOSIS — I82409 Acute embolism and thrombosis of unspecified deep veins of unspecified lower extremity: Secondary | ICD-10-CM | POA: Diagnosis not present

## 2022-11-20 ENCOUNTER — Ambulatory Visit: Payer: Medicare PPO

## 2022-11-20 LAB — LAB REPORT - SCANNED
Albumin, Urine POC: 35.2
Albumin/Creatinine Ratio, Urine, POC: 27
Creatinine, POC: 132.3 mg/dL
EGFR: 25

## 2022-11-20 NOTE — Telephone Encounter (Signed)
I'm out of the office until next Tuesday. Can you please get the patient assistance paperwork prepared for her and I will sign the rx Tuesday. Thanks

## 2022-11-23 ENCOUNTER — Ambulatory Visit: Payer: Medicare PPO | Admitting: Podiatry

## 2022-11-23 ENCOUNTER — Ambulatory Visit (INDEPENDENT_AMBULATORY_CARE_PROVIDER_SITE_OTHER): Payer: Medicare PPO

## 2022-11-23 DIAGNOSIS — B351 Tinea unguium: Secondary | ICD-10-CM | POA: Diagnosis not present

## 2022-11-23 DIAGNOSIS — Z7901 Long term (current) use of anticoagulants: Secondary | ICD-10-CM

## 2022-11-23 DIAGNOSIS — E538 Deficiency of other specified B group vitamins: Secondary | ICD-10-CM | POA: Diagnosis not present

## 2022-11-23 DIAGNOSIS — M79675 Pain in left toe(s): Secondary | ICD-10-CM

## 2022-11-23 DIAGNOSIS — M79674 Pain in right toe(s): Secondary | ICD-10-CM | POA: Diagnosis not present

## 2022-11-23 MED ORDER — CYANOCOBALAMIN 1000 MCG/ML IJ SOLN
1000.0000 ug | Freq: Once | INTRAMUSCULAR | Status: AC
Start: 2022-11-23 — End: 2022-11-23
  Administered 2022-11-23: 1000 ug via INTRAMUSCULAR

## 2022-11-23 NOTE — Progress Notes (Signed)
Pt was given B12 injection w/o any complications. 

## 2022-11-24 ENCOUNTER — Other Ambulatory Visit: Payer: Self-pay

## 2022-11-24 MED ORDER — BREZTRI AEROSPHERE 160-9-4.8 MCG/ACT IN AERO: 2.0000 | INHALATION_SPRAY | Freq: Two times a day (BID) | RESPIRATORY_TRACT | 5 refills | Status: DC

## 2022-11-25 NOTE — Progress Notes (Signed)
Subjective: No chief complaint on file.   72 y.o. returns the office today for painful, elongated, thickened toenails which she cannot trim herself. Denies any redness or drainage around the nails.  No new pedal complaints today.  On Eliquis   PCP: Pincus Sanes, MD Last Seen: 09/23/2022  A1c: 6 on Jul 03, 2022  Objective: AAO 3, NAD- in wheelchair DP/PT pulses decreased- feet are warm and perfused.  Chronic lower extremity edema present.  Sensation decreased as well as to monofilament. Nails hypertrophic, dystrophic, elongated, brittle, discolored 10. There is tenderness overlying the nails 1-5 bilaterally. There is no surrounding erythema or drainage along the nail sites. No open lesions or pre-ulcerative lesions are identified. No pain with calf compression, swelling, warmth, erythema. Overall, stable.   Assessment: Patient presents with symptomatic onychomycosis  Plan: -Treatment options including alternatives, risks, complications were discussed -Nails sharply debrided 10 without complication/bleeding. -Discussed daily foot inspection. If there are any changes, to call the office immediately.  -Follow-up in 3 months or sooner if any problems are to arise. In the meantime, encouraged to call the office with any questions, concerns, changes symptoms.  Return in about 3 months (around 02/23/2023).  Ovid Curd, DPM

## 2022-11-26 ENCOUNTER — Ambulatory Visit: Payer: Medicare PPO | Admitting: Nurse Practitioner

## 2022-11-26 ENCOUNTER — Encounter: Payer: Self-pay | Admitting: Nurse Practitioner

## 2022-11-26 VITALS — BP 124/62 | HR 90 | Temp 98.7°F | Ht 64.0 in | Wt 321.0 lb

## 2022-11-26 DIAGNOSIS — J9612 Chronic respiratory failure with hypercapnia: Secondary | ICD-10-CM

## 2022-11-26 DIAGNOSIS — J309 Allergic rhinitis, unspecified: Secondary | ICD-10-CM

## 2022-11-26 DIAGNOSIS — R058 Other specified cough: Secondary | ICD-10-CM | POA: Diagnosis not present

## 2022-11-26 DIAGNOSIS — I5032 Chronic diastolic (congestive) heart failure: Secondary | ICD-10-CM | POA: Diagnosis not present

## 2022-11-26 DIAGNOSIS — J4540 Moderate persistent asthma, uncomplicated: Secondary | ICD-10-CM | POA: Diagnosis not present

## 2022-11-26 DIAGNOSIS — J019 Acute sinusitis, unspecified: Secondary | ICD-10-CM | POA: Diagnosis not present

## 2022-11-26 DIAGNOSIS — J9611 Chronic respiratory failure with hypoxia: Secondary | ICD-10-CM | POA: Diagnosis not present

## 2022-11-26 MED ORDER — HYDROCODONE BIT-HOMATROP MBR 5-1.5 MG/5ML PO SOLN
5.0000 mL | Freq: Three times a day (TID) | ORAL | 0 refills | Status: DC | PRN
Start: 2022-11-26 — End: 2023-01-08

## 2022-11-26 MED ORDER — AZELASTINE HCL 0.1 % NA SOLN
2.0000 | Freq: Two times a day (BID) | NASAL | 5 refills | Status: AC
Start: 2022-11-26 — End: ?

## 2022-11-26 NOTE — Patient Instructions (Addendum)
Continue BiPAP nightly  Use for a minimum 6 hours a night, with goal of using entire night.  Change equipment every 30 days or as directed by DME.  Be aware of reduced alertness and do not drive or operate heavy machinery if experiencing this or drowsiness.  -Continue nighttime supplemental O2 therapy at 1L through BiPAP -Continue 2 lpm supplemental oxygen on POC as needed with activity. Goal oxygen >88-90%. Notify of increasing requirements.    -Continue Breztri 2 puffs Twice daily. Brush tongue and rinse mouth afterwards.  -Continue Pulmicort inhaler 2 puffs twice a day as needed for shortness of breath or wheezing  -Continue Ventolin inhaler 2 puffs as needed every 4 hours as needed for shortness of breath or wheezing  -Continue Singulair 10 mg At bedtime -Continue Flonase 2 sprays each nostril daily  -Continue Mucinex 600 mg Twice daily for chest congestion -Continue Claritin 10 mg daily for allergies    -Hycodan cough syrup 5 mL every 8 hours as needed for severe coughing. Do not drive after taking. May cause drowsiness and increase risk for fall. Do not take within 4 hours of taking oxycodone -Restart benzonatate 1 capsule Three times a day for cough -Astelin nasal spray 2 sprays each nostril Twice daily  -Do saline rinses, such as Netti Pot or Neil Med rinse 1-2 times a day until symptoms improve. Use bottled, distilled water. Follow with your nasal sprays 20-30 minutes later  -Complete doxycycline as previously prescribed    Follow up in 3 months with Dr. Wynona Neat or Philis Nettle. If symptoms do not improve or worsen, please contact office for sooner follow up or seek emergency care

## 2022-11-26 NOTE — Assessment & Plan Note (Signed)
BLE improved per her report. No overt edema on exam. Follow up with cardiology and nephrology as scheduled.

## 2022-11-26 NOTE — Assessment & Plan Note (Signed)
Sinusitis vs allergic rhinitis flare. Add on astelin and saline rinses. See above.

## 2022-11-26 NOTE — Assessment & Plan Note (Signed)
Stable without increased O2 requirement. Goal >88-90% 

## 2022-11-26 NOTE — Progress Notes (Signed)
@Patient  ID: April [name], female    DOB: Sep 19, 1950, 72 y.o.   MRN: [id number]  Chief Complaint  Patient presents with   Follow-up    Cough x 2 weeks.  Saw kidney MD.  Rx doxycycline x 7 days.  Some improvement.  On day 5 today.    Referring provider: Pincus Sanes, MD  HPI: 72 year old female, never smoker followed for nocturnal hypoxemia, pulmonary hypertension, obesity hypoventilation syndrome and asthma.  She is a patient of Dr. Trena Platt and last seen in office 10/07/2022 by Allison Quarry NP.  Past medical history significant for CHF, former DVT on chronic anticoagulation, hypertension, CAD, GERD, PCP, DM2, morbid obesity, CKD stage III, HLD  TEST/EVENTS:  05/07/2020 CTA chest: Mild cardiomegaly suggestive of PA hypertension.  Atherosclerosis.  No lymphadenopathy.  Minimal groundglass opacities in the lung bases.  There is slight suboptimal opacification of main PA without central or proximal segmental PE.  Small hiatal hernia 11/18/2020 echocardiogram: EF 65 to 70%.  LVH.  G1 DD.  The interventricular septum is flattened consistent with RV pressure and volume overload 11/19/2020 right heart cath: Mild to moderate pulmonary hypertension there is minimally elevated PC WP.  Normal CO 12/09/2020 split-night study: Negative for OSA.  Nocturnal desaturations to 84% requiring 1 L/min O2.  Recommended continued use of BiPAP for CO2 blow off and 1 L supplemental O2 at night 04/04/2021 CXR 2 view: Frontal and lateral views of the chest were unremarkable.  There was no acute airspace disease noted. 08/29/2021 CXR: vascular congestion. Lower lung interstitial opacities suggestive of edema  10/07/2022 CXR: lungs clear  10/02/2020: OV with Dr. Wynona Neat. Scheduled in lab split-night. Lunesta low dose for sleep. Weight loss advised but limits with severe arthritis. Encouraged to decrease naps and consolidate sleep at night. F/u in 3-4 months.    11/15/2020 - 11/23/2018: Hospital admission for acute on chronic CHF  exacerbation and acute on chronic hypoxic hypercapnic respiratory failure.  pH 7.29 PCO2 of 80 and PO2 of 60. IV diuresis with Lasix.  Right heart cath on 10/19.  PCCM consulted with recommendations for BiPAP versus NIPPV upon discharge.   01/03/2021: OV with Lynett Brasil NP for follow up after sleep study and overdue hospital follow up. Breathing stable. Continued on BiPAP and supplemental oxygen at night 1 lpm. Continued on Symbicort for maintenance. PRN Ventolin. Singulair at bedtime.   04/04/2021: OV with Jamarion Jumonville NP for follow-up.  Felt like she had been having to use her oxygen more during the day and not just at night.  Progressive DOE.  Consistently wearing 2 L/min with activity; reports she has been doing this for the past few months.  Started after we saw her last.  Continues to wear BiPAP at night along with 1 L/min supplemental O2.  She was concerned that her asthma was flaring as she had not had any increased cough over the past week.  Suspected that DOE was multifactorial related to CHF, obesity, deconditioning and slight asthma component.  She was unable to complete FeNO however there was no significant bronchospasm on exam.  Trialed step up in therapy to [facility].  Continued Singulair at night.  Added Claritin for trigger prevention.  Flonase for postnasal drainage control.  And Delsym and Tessalon Perles for cough control.  Suspected that there was an upper airway component to her cough.  Walking oximetry with desaturations to 84% on room air.  She was placed on 3 L/min POC and maintain saturations in the 90s. CXR and labs unremarkable.  05/16/2021: OV with Tytus Strahle NP for follow-up.  Overall her breathing has been relatively stable and at baseline.  Feels like she did notice a improvement with the Breztri.  She ran out of samples and went back to her Symbicort.  Rx sent for [employer]. Cough is overall stable as well.  Relatively infrequent and usually dry.  Her biggest complaint was that she fell at the end of  March and has had generalized pain since.  She does have a place in her groin where she landed on her cane that is giving her the most trouble.  She is [facility] after this visit today to discuss this.  She denies any lightheaded or dizziness.  No loss of consciousness with fall.  Allergies seem relatively well controlled.  Does still have occasional postnasal drip but improved with her changes last time.  She continues on BiPAP nightly.  She has yet to receive her POC from her DME company.  She has been using her oxygen concentrator at home.  She is on room air currently in office with oxygen saturations in the 90s.  She is usually able to maintain saturations at rest but drops to the 80s with activity - qualified for POC 3 lpm at OV so orders sent to DME.    08/15/2021: OV with Dariel Betzer NP for follow up with her husband. She reports that her breathing has been stable. She is feeling well on the [place]. She still occasionally has some wheezing but usually clears on it's own. She rarely has to use her albuterol. She gets winded with long distances and climbing, and is normally relatively sedentary; this is unchanged from her baseline and some is related to her chronic knee pain. She is actively trying to lose weight and is down about 20 pounds. Allergies are well controlled. She wears her BiPAP nightly. She finally got her POC machine this morning and has herself set to 2 lpm which she is 98% on. Rare use of pulmicort or albuterol inhalers. No concerns or complaints today.   11/21/2021: OV with Dr. Wynona Neat. Continues to do well. Some weight loss. Denies any significant symptoms at present. She continues to use [employer]. She is using BiPAP nightly for hypercapnic respiratory failure. Oxygen still drops with activity.   05/04/2022: OV with Kelley Knoth NP for follow up with her husband. She was doing pretty well up until the last few weeks. She's been having some more trouble with her breathing and  chest tightness. She's also been feeling like her sinuses are congested, having some occasional headaches, and dealing with a cough that is minimally productive at times with yellow phlegm. She is having similar colored nasal drainage. She thinks it is all related to her allergies. She has noticed an occasional wheeze. She is also worried about a tender and more red spot on her right lower leg. She first noticed this around a week ago. Denies any drainage, fevers, chills, increased leg swelling. No weight gain, orthopnea, PND. No known sick exposures. She has not missed any doses of her Eliquis. Eating and drinking well. She is using her Breztri twice a day, taking singulair and using flonase. She has not been on Claritin as she ran out. She has not been using her rescue inhalers but she has been using her oxygen more. She admits to decreased use of BiPAP as she prefers to wear her oxygen at night.   06/03/2022: OV with Vivien Barretto NP for follow up. Feeling better  after our last visit. Still has some occasional cough with clear to white phlegm. Chest and sinus congestion are better. Usually depends on the day and the pollen. Denies any fevers, chills, hemoptysis, increased leg swelling, orthopnea. She was told by her DME company that she needs to repeat her oxygen qualification. She's not sure why because she just got her POC in July of last year. She has a BiPAP, which is paid off. Not sleeping with it because she prefers the oxygen.   10/07/2022: OV with Michie Molnar NP for follow-up with her husband but is actually struggling with some acute symptoms.  She tells me that at the end of July/beginning of August she had a viral illness.  She thinks she had COVID.  She did lose her taste and smell.  Did not complete any viral testing.  At the time of onset, she had sinus congestion and drainage, cough, fevers and chills.  She did not have much of an appetite.  She slowly improved but has still had a persistent cough with chest  congestion.  She is producing some yellow phlegm.  Because of this she went to go see her primary care provider on 8/21.  They prescribed a course of doxycycline, which she completed about a week ago and benzonatate for cough control.  Unfortunately, she is not feeling much better.  She does feeling the benzonatate helps some with the cough but is still producing white to yellow phlegm.  She does feel a little more short winded than she usually does.  Has noticed some more wheezing.  Has not had any recurrence of fevers.  Denies any chills, hemoptysis, increased lower extremity swelling, weight gain.  Sinus symptoms have mostly resolved.  Still has some clear drainage from her nose.  Her appetite does seem to finally be returning.  She starting to be able to smell things again as well.  Tried Robitussin DM over-the-counter but does not seem to be doing much for her cough either.  She is still using her Breztri and Singulair.  Will take Pulmicort a few times a week.  11/26/2022: Today - follow up Patient presents today for follow-up.  She was doing better after her last visit.  Felt that the Z-Pak and prednisone cleared up her symptoms.  Then about 2 weeks ago, she started having a increased productive cough again with yellow sputum and would get into coughing fits that sometimes caused her to gag.  She was also feeling a little more short winded with these coughing spells.  Did notice some wheezing as well.  At this time, she was having some more swelling in her legs.  Her cardiologist had adjusted her fluid pill but then her nephrologist changed it again.  She does feel like the swelling is better.  Her nephrologist also put her on a course of doxycycline for the productive cough.  She is currently on day 5.  Is feeling better.  Feels like she is not coughing as much and chest congestion has improved.  Feels like her breathing is close to her baseline.  She does still have some nasal congestion and postnasal  drainage.  When she blows her nose mucus is usually white to yellow.  Denies any fevers, chills, hemoptysis, orthopnea, headaches, sore throat. No known sick exposures.She's using her rescue inhaler less. Still on Breztri twice a day. Takes her singulair, flonase, mucinex, and claritin. Has not been using the tessalon since she started the doxycycline.    Allergies  Allergen  Reactions   Penicillins Rash and Other (See Comments)    She was told not to take it anymore. Has patient had a PCN reaction causing immediate rash, facial/tongue/throat swelling, SOB or lightheadedness with hypotension: Yes Has patient had a PCN reaction causing severe rash involving mucus membranes or skin necrosis: No Has patient had a PCN reaction that required hospitalization: No Has patient had a PCN reaction occurring within the last 10 years: No If all of the above answers are "NO", then may proceed with Cephalosporin use.'   Shellfish Allergy Anaphylaxis   Sulfonamide Derivatives Anaphylaxis and Other (See Comments)   Hydrochlorothiazide W-Triamterene Other (See Comments)    Hypokalemia   Lotensin [Benazepril Hcl] Hives   Dipyridamole Other (See Comments)    Unknown reaction   Estrogens Other (See Comments)    Unknown reaction   Hydrochlorothiazide Other (See Comments)    Unknown reaction, Hypokalemia?   Latex Other (See Comments)    Unknown reaction - stated previously during surgery gloves had to be changed, but unsure if it is still an allergy or not.    Metronidazole Other (See Comments)    Unknown reaction   Other Itching    PICKLES   Spironolactone Other (See Comments)    UNSPECIFIED > "kidney problems"   Torsemide Other (See Comments)    Unknown reaction   Valsartan Other (See Comments)    Unknown reaction   Mustard [Allyl Isothiocyanate] Itching    Immunization History  Administered Date(s) Administered   Fluad Quad(high Dose 65+) 10/28/2018, 10/18/2020, 11/07/2021   Fluad Trivalent(High  Dose 65+) 11/12/2022   Influenza Split 12/03/2010, 11/03/2011   Influenza Whole 11/16/2006, 11/22/2007, 10/31/2008, 10/29/2009   Influenza, High Dose Seasonal PF 10/10/2015, 11/06/2016, 11/16/2017, 11/08/2019   Influenza,inj,Quad PF,6+ Mos 10/19/2012, 10/25/2013, 10/29/2014   Influenza-Unspecified 10/23/2016, 11/18/2016   Moderna Sars-Covid-2 Vaccination 03/13/2019, 04/10/2019, 12/06/2019   PFIZER Comirnaty(Gray Top)Covid-19 Tri-Sucrose Vaccine 11/14/2021   Pfizer Covid-19 Vaccine Bivalent Booster 62yrs & up 11/08/2020   Pneumococcal Conjugate-13 06/07/2015   Pneumococcal Polysaccharide-23 03/24/2005, 01/04/2012, 06/29/2017   Td 06/24/2009   Tdap 10/09/2014   Zoster Recombinant(Shingrix) 08/11/2016, 10/23/2016   Zoster, Live 01/15/2012    Past Medical History:  Diagnosis Date   Anemia    Asthma    CAD (coronary artery disease)    Carpal tunnel syndrome    Cellulitis of both lower extremities 04/11/2015   CHF (congestive heart failure) (HCC)    Chronic kidney disease    CKI- followed by [facility]   Colon polyp, hyperplastic 2007 & 2012   Complication of anesthesia 1999   svt with renal calculi surgery, no problems since   CTS (carpal tunnel syndrome)    bilateral   Diabetes mellitus    Eczema    GERD (gastroesophageal reflux disease)    History of kidney stones 1999   Hyperlipidemia    Hypertension    Leg ulcer (HCC) 04/24/2015   Right lateral leg No evidence of an infection Monitor closely Keep edema controlled    Leg ulcer (HCC)    right lower   Meralgia paresthetica    Dr. Terrace Arabia   Morbid obesity (HCC)    Neuropathy    toes and legs   Osteoarthrosis, unspecified whether generalized or localized, lower leg    knee   PUD (peptic ulcer disease)    Shortness of breath dyspnea    with exertion   Sleep apnea    per progress note 02/25/2018   Type II or unspecified type diabetes  mellitus without mention of complication, not stated as uncontrolled    Unspecified  hereditary and idiopathic peripheral neuropathy    Urticaria    Vitamin B12 deficiency    Wears glasses    Wound cellulitis    right upper leg, healing well    Tobacco History: Social History   Tobacco Use  Smoking Status Never  Smokeless Tobacco Never   Counseling given: Not Answered   Outpatient Medications Prior to Visit  Medication Sig Dispense Refill   acetaminophen (TYLENOL) 650 MG CR tablet Take 1,300 mg by mouth every 8 (eight) hours as needed for pain.     Alcohol Swabs (DROPSAFE ALCOHOL PREP) 70 % PADS USE TOPICALLY AS DIRECTED 300 each 3   allopurinol (ZYLOPRIM) 100 MG tablet TAKE 2 TABLETS(200 MG) BY MOUTH DAILY 180 tablet 1   atorvastatin (LIPITOR) 40 MG tablet TAKE 1 TABLET(40 MG) BY MOUTH AT BEDTIME 90 tablet 2   benzonatate (TESSALON) 200 MG capsule Take 1 capsule (200 mg total) by mouth 3 (three) times daily as needed for cough. 60 capsule 1   Blood Glucose Calibration (TRUE METRIX LEVEL 1) Low SOLN UAD  E11.9 1 each 3   Blood Glucose Monitoring Suppl (TRUE METRIX METER) DEVI True Metrix Level 1 solution     Budeson-Glycopyrrol-Formoterol (BREZTRI AEROSPHERE) 160-9-4.8 MCG/ACT AERO Inhale 2 puffs into the lungs in the morning and at bedtime. 10.7 g 5   calcium carbonate (TUMS - DOSED IN MG ELEMENTAL CALCIUM) 500 MG chewable tablet Chew 2 tablets by mouth daily as needed for indigestion or heartburn.     cyanocobalamin (,VITAMIN B-12,) 1000 MCG/ML injection INJECT INTO MUSCLE EVERY 30 DAYS 10 mL 5   dapagliflozin propanediol (FARXIGA) 10 MG TABS tablet Take 10 mg by mouth daily.     diclofenac Sodium (VOLTAREN) 1 % GEL Apply 1 application topically as needed (pain).     diphenhydrAMINE (BENADRYL) 12.5 MG/5ML liquid Take 25 mg by mouth 4 (four) times daily as needed for allergies.     doxycycline (VIBRA-TABS) 100 MG tablet Take 1 tablet (100 mg total) by mouth 2 (two) times daily. (Patient taking differently: Take 100 mg by mouth 2 (two) times daily. Take for 7  days total) 14 tablet 0   ELIQUIS 2.5 MG TABS tablet TAKE 1 TABLET BY MOUTH TWICE DAILY 180 tablet 1   famotidine (PEPCID) 40 MG tablet Take 1 tablet (40 mg total) by mouth daily. 180 tablet 3   FEROSUL 325 (65 Fe) MG tablet TAKE 1 TABLET BY MOUTH DAILY ON MONDAYS WEDNESDAY AND FRIDAYS 60 tablet 0   fluticasone (FLONASE) 50 MCG/ACT nasal spray Place 2 sprays into both nostrils daily. 18.2 mL 2   gabapentin (NEURONTIN) 100 MG capsule TAKE 1 CAPSULE(100 MG) BY MOUTH THREE TIMES DAILY 90 capsule 1   hydrOXYzine (ATARAX) 25 MG tablet Take 1 tablet (25 mg total) by mouth 3 (three) times daily as needed for itching. 90 tablet 1   montelukast (SINGULAIR) 10 MG tablet TAKE 1 TABLET(10 MG) BY MOUTH AT BEDTIME 30 tablet 5   nebivolol (BYSTOLIC) 5 MG tablet TAKE 1 TABLET(5 MG) BY MOUTH DAILY 30 tablet 3   Oxcarbazepine (TRILEPTAL) 300 MG tablet Take 1 tablet (300 mg total) by mouth 2 (two) times daily. 180 tablet 1   polyethylene glycol (MIRALAX / GLYCOLAX) 17 g packet Take 17 g by mouth daily as needed for moderate constipation.     potassium chloride SA (KLOR-CON M) 20 MEQ tablet TAKE 1 TABLET BY  MOUTH DAILY AND 3 TABLETS BY MOUTH EVERY WEDNESDAY 102 tablet 3   Spacer/Aero-Holding [facility] Use with inhaler. 1 each 2   spironolactone (ALDACTONE) 25 MG tablet TAKE 1 TABLET(25 MG) BY MOUTH DAILY 90 tablet 1   tirzepatide (MOUNJARO) 15 MG/0.5ML Pen Inject 15 mg into the skin once a week. 6 mL 2   torsemide (DEMADEX) 20 MG tablet TAKE 2 TABLETS(40 MG) BY MOUTH TWICE DAILY 360 tablet 3   triamcinolone ointment (KENALOG) 0.5 % Apply 1 Application topically as needed (knee pain).     VENTOLIN HFA 108 (90 Base) MCG/ACT inhaler Inhale 2 puffs into the lungs every 4 (four) hours as needed for wheezing.     HYDROcodone bit-homatropine (HYCODAN) 5-1.5 MG/5ML syrup Take 5 mLs by mouth every 8 (eight) hours as needed for cough. 120 mL 0   metolazone (ZAROXOLYN) 2.5 MG tablet Take 1 tablet (2.5 mg total) by mouth  daily for 2 doses. With 40 meq of potassium 2 tablet 0   azithromycin (ZITHROMAX) 250 MG tablet Take 2 tablets on day one then take 1 tablet daily for four additional days (Patient not taking: Reported on 11/26/2022) 6 tablet 0   Budeson-Glycopyrrol-Formoterol (BREZTRI AEROSPHERE) 160-9-4.8 MCG/ACT AERO Inhale 2 puffs into the lungs in the morning and at bedtime. (Patient not taking: Reported on 11/26/2022) 21.4 g 12   guaiFENesin-dextromethorphan (ROBITUSSIN DM) 100-10 MG/5ML syrup Take 5 mLs by mouth every 4 (four) hours as needed for cough. (Patient not taking: Reported on 11/26/2022)     ondansetron (ZOFRAN) 4 MG tablet Take 1 tablet (4 mg total) by mouth every 8 (eight) hours as needed for nausea or vomiting. (Patient not taking: Reported on 11/26/2022) 20 tablet 0   oxyCODONE (OXY IR/ROXICODONE) 5 MG immediate release tablet Take 1 tablet (5 mg total) by mouth every 6 (six) hours as needed for severe pain (Chronic knee osteoarthritis). (Patient not taking: Reported on 11/26/2022) 20 tablet 0   predniSONE (DELTASONE) 10 MG tablet 4 tabs for 2 days, then 3 tabs for 2 days, 2 tabs for 2 days, then 1 tab for 2 days, then stop (Patient not taking: Reported on 11/26/2022) 20 tablet 0   No facility-administered medications prior to visit.     Review of Systems:   Constitutional: No weight loss or gain, night sweats, fevers, chills, +fatigue (improving) HEENT: No headaches, difficulty swallowing, tooth/dental problems, or sore throat. No sneezing, itching, ear ache. +nasal congestion/drainage CV:  +swelling in lower extremities (improving). No chest pain, orthopnea, PND, anasarca, dizziness, palpitations, syncope Resp: +shortness of breath with exertion (baseline); productive cough (improving); occasional wheeze. No hemoptysis.  No chest wall deformity GI:  No heartburn, indigestion, abdominal pain, nausea, vomiting, diarrhea, change in bowel habits, loss of appetite, bloody stools.  GU: No dysuria,  change in color of urine, urgency or frequency.   Skin: No rash.  MSK:  +knee pain (chronic). No joint swelling.  No decreased range of motion.  No back pain. Neuro: No dizziness or lightheadedness.  Psych: No depression or anxiety. Mood stable.     Physical Exam:  BP 124/62 (BP Location: Left Wrist, Patient Position: Sitting, Cuff Size: Large)   Pulse 90   Temp 98.7 F (37.1 C) (Oral)   Ht 5\' 4"  (1.626 m)   Wt (!) 321 lb (145.6 kg)   SpO2 94%   BMI 55.10 kg/m   GEN: Pleasant, interactive, chronically-ill appearing; morbidly obese; in no acute distress. HEENT:  Normocephalic and atraumatic. PERRLA. Sclera white. Nasal  turbinates erythematous, moist and patent bilaterally. White rhinorrhea present. Oropharynx pink and moist, without exudate or edema. No lesions, ulcerations NECK:  Supple w/ fair ROM. No JVD present. No LAD.  CV: RRR, no m/r/g, dependent, non-pitting peripheral edema. Pulses intact, +2 bilaterally. No cyanosis, pallor or clubbing. PULMONARY:  Unlabored, regular breathing. Clear bilaterally A&P w/o wheezes/rales/rhonchi. No accessory muscle use. No dullness to percussion. GI: BS present and normoactive. Soft, non-tender to palpation.  MSK: No erythema, warmth or tenderness to left knee. Cap refil <2 sec all extrem. No deformities or joint swelling noted.  Neuro: A/Ox3. No focal deficits noted.   Skin: Intact, warm. Psych: Normal affect and behavior. Judgement and thought content appropriate.     Lab Results:  CBC    Component Value Date/Time   WBC 5.1 08/12/2022 1008   WBC 6.8 07/03/2022 0851   RBC 4.12 08/12/2022 1008   HGB 12.8 08/12/2022 1008   HCT 41.4 08/12/2022 1008   PLT 157 08/12/2022 1008   MCV 100.5 (H) 08/12/2022 1008   MCH 31.1 08/12/2022 1008   MCHC 30.9 08/12/2022 1008   RDW 15.1 08/12/2022 1008   LYMPHSABS 1.2 08/12/2022 1008   MONOABS 0.4 08/12/2022 1008   EOSABS 0.1 08/12/2022 1008   BASOSABS 0.1 08/12/2022 1008    BMET     Component Value Date/Time   NA 138 11/16/2022 1516   NA 145 (H) 10/25/2012 1117   K 3.6 11/16/2022 1516   CL 93 (L) 11/16/2022 1516   CO2 33 (H) 11/16/2022 1516   GLUCOSE 91 11/16/2022 1516   BUN 32 (H) 11/16/2022 1516   BUN 24 10/25/2012 1117   CREATININE 2.15 (H) 11/16/2022 1516   CREATININE 1.72 (H) 08/12/2022 1008   CALCIUM 9.4 11/16/2022 1516   GFRNONAA 24 (L) 11/16/2022 1516   GFRNONAA 31 (L) 08/12/2022 1008   GFRAA 44 (L) 02/28/2019 0252    BNP    Component Value Date/Time   BNP 34.2 09/03/2021 1026     Imaging:  No results found.  cyanocobalamin (VITAMIN B12) injection 1,000 mcg     Date Action Dose Route User   10/21/2022 1525 Given 1,000 mcg Intramuscular (Right Deltoid) Levonne Lapping, CMA      cyanocobalamin (VITAMIN B12) injection 1,000 mcg     Date Action Dose Route User   11/23/2022 [phone] Given 1,000 mcg Intramuscular (Left Deltoid) Scruggs-Hernandez, [place] Bing, CMA           No data to display          No results found for: "NITRICOXIDE"      Assessment & Plan:   Asthma, moderate persistent Clinically improved following Z-Pak and prednisone after her last visit.  She had recurrence of cough over the last 2 weeks, started on doxycycline.  Cough seems to be more so upper airway in nature and related to sinusitis.  Lung exam was clear today.  No indication for steroids at this time.  Instructed her to complete doxycycline.  Target cough control and postnasal drainage.  Continue current maintenance regimen.  Action plan in place.  Patient Instructions  Continue BiPAP nightly  Use for a minimum 6 hours a night, with goal of using entire night.  Change equipment every 30 days or as directed by DME.  Be aware of reduced alertness and do not drive or operate heavy machinery if experiencing this or drowsiness.  -Continue nighttime supplemental O2 therapy at 1L through BiPAP -Continue 2 lpm supplemental oxygen on POC as needed  with activity.  Goal oxygen >88-90%. Notify of increasing requirements.    -Continue Breztri 2 puffs Twice daily. Brush tongue and rinse mouth afterwards.  -Continue Pulmicort inhaler 2 puffs twice a day as needed for shortness of breath or wheezing  -Continue Ventolin inhaler 2 puffs as needed every 4 hours as needed for shortness of breath or wheezing  -Continue Singulair 10 mg At bedtime -Continue Flonase 2 sprays each nostril daily  -Continue Mucinex 600 mg Twice daily for chest congestion -Continue Claritin 10 mg daily for allergies    -Hycodan cough syrup 5 mL every 8 hours as needed for severe coughing. Do not drive after taking. May cause drowsiness and increase risk for fall. Do not take within 4 hours of taking oxycodone -Restart benzonatate 1 capsule Three times a day for cough -Astelin nasal spray 2 sprays each nostril Twice daily  -Do saline rinses, such as Netti Pot or Neil Med rinse 1-2 times a day until symptoms improve. Use bottled, distilled water. Follow with your nasal sprays 20-30 minutes later  -Complete doxycycline as previously prescribed    Follow up in 3 months with Dr. Wynona Neat or Philis Nettle. If symptoms do not improve or worsen, please contact office for sooner follow up or seek emergency care   Acute sinusitis Sinusitis vs allergic rhinitis flare. Add on astelin and saline rinses. See above.   Chronic respiratory failure with hypoxia and hypercapnia (HCC) Stable without increased O2 requirement. Goal >88-90%  Chronic diastolic (congestive) heart failure (HCC) BLE improved per her report. No overt edema on exam. Follow up with cardiology and nephrology as scheduled.     I spent 35 minutes of dedicated to the care of this patient on the date of this encounter to include pre-visit review of records, face-to-face time with the patient discussing conditions above, post visit ordering of testing, clinical documentation with the electronic health record, making appropriate  referrals as documented, and communicating necessary findings to members of the patients care team.  Noemi Chapel, NP 11/26/2022  Pt aware and understands NP's role.

## 2022-11-26 NOTE — Assessment & Plan Note (Signed)
Clinically improved following Z-Pak and prednisone after her last visit.  She had recurrence of cough over the last 2 weeks, started on doxycycline.  Cough seems to be more so upper airway in nature and related to sinusitis.  Lung exam was clear today.  No indication for steroids at this time.  Instructed her to complete doxycycline.  Target cough control and postnasal drainage.  Continue current maintenance regimen.  Action plan in place.  Patient Instructions  Continue BiPAP nightly  Use for a minimum 6 hours a night, with goal of using entire night.  Change equipment every 30 days or as directed by DME.  Be aware of reduced alertness and do not drive or operate heavy machinery if experiencing this or drowsiness.  -Continue nighttime supplemental O2 therapy at 1L through BiPAP -Continue 2 lpm supplemental oxygen on POC as needed with activity. Goal oxygen >88-90%. Notify of increasing requirements.    -Continue Breztri 2 puffs Twice daily. Brush tongue and rinse mouth afterwards.  -Continue Pulmicort inhaler 2 puffs twice a day as needed for shortness of breath or wheezing  -Continue Ventolin inhaler 2 puffs as needed every 4 hours as needed for shortness of breath or wheezing  -Continue Singulair 10 mg At bedtime -Continue Flonase 2 sprays each nostril daily  -Continue Mucinex 600 mg Twice daily for chest congestion -Continue Claritin 10 mg daily for allergies    -Hycodan cough syrup 5 mL every 8 hours as needed for severe coughing. Do not drive after taking. May cause drowsiness and increase risk for fall. Do not take within 4 hours of taking oxycodone -Restart benzonatate 1 capsule Three times a day for cough -Astelin nasal spray 2 sprays each nostril Twice daily  -Do saline rinses, such as Netti Pot or Neil Med rinse 1-2 times a day until symptoms improve. Use bottled, distilled water. Follow with your nasal sprays 20-30 minutes later  -Complete doxycycline as previously prescribed     Follow up in 3 months with Dr. Wynona Neat or Philis Nettle. If symptoms do not improve or worsen, please contact office for sooner follow up or seek emergency care

## 2022-11-27 ENCOUNTER — Ambulatory Visit: Payer: Medicare PPO | Admitting: Podiatry

## 2022-11-30 ENCOUNTER — Telehealth: Payer: Self-pay | Admitting: Nurse Practitioner

## 2022-11-30 NOTE — Telephone Encounter (Signed)
Patient states needs refill for Tessalon. Pharmacy is [facility] [address]. Patient phone number is [phone]

## 2022-12-01 NOTE — Telephone Encounter (Signed)
Patient calling back & she states needs refill for Tessalon.  Pharmacy is [facility] [address]

## 2022-12-01 NOTE — Telephone Encounter (Signed)
Atc pt no answer, could not leave vm due to vm set up is a female voice. Will try again later

## 2022-12-02 NOTE — Telephone Encounter (Signed)
Patient made aware she still has 1 refill Called pharmacy and asked to fill Patient aware nothing further needed.

## 2022-12-02 NOTE — Telephone Encounter (Signed)
Patient calling back & she states needs refill for Tessalon.  Pharmacy is [facility] [address]

## 2022-12-04 ENCOUNTER — Encounter (HOSPITAL_COMMUNITY): Payer: Self-pay | Admitting: Internal Medicine

## 2022-12-04 ENCOUNTER — Ambulatory Visit (HOSPITAL_COMMUNITY)
Admission: RE | Admit: 2022-12-04 | Discharge: 2022-12-04 | Disposition: A | Discharge status: Home / Self Care | Payer: Medicare PPO | Source: Ambulatory Visit | Attending: Internal Medicine | Admitting: Internal Medicine

## 2022-12-04 VITALS — BP 94/66 | HR 93 | Wt 322.0 lb

## 2022-12-04 DIAGNOSIS — E1151 Type 2 diabetes mellitus with diabetic peripheral angiopathy without gangrene: Secondary | ICD-10-CM | POA: Insufficient documentation

## 2022-12-04 DIAGNOSIS — J45909 Unspecified asthma, uncomplicated: Secondary | ICD-10-CM | POA: Insufficient documentation

## 2022-12-04 DIAGNOSIS — I130 Hypertensive heart and chronic kidney disease with heart failure and stage 1 through stage 4 chronic kidney disease, or unspecified chronic kidney disease: Secondary | ICD-10-CM | POA: Diagnosis not present

## 2022-12-04 DIAGNOSIS — E669 Obesity, unspecified: Secondary | ICD-10-CM | POA: Diagnosis not present

## 2022-12-04 DIAGNOSIS — Z7984 Long term (current) use of oral hypoglycemic drugs: Secondary | ICD-10-CM | POA: Diagnosis not present

## 2022-12-04 DIAGNOSIS — Z7985 Long-term (current) use of injectable non-insulin antidiabetic drugs: Secondary | ICD-10-CM | POA: Diagnosis not present

## 2022-12-04 DIAGNOSIS — I5032 Chronic diastolic (congestive) heart failure: Secondary | ICD-10-CM

## 2022-12-04 DIAGNOSIS — Z79899 Other long term (current) drug therapy: Secondary | ICD-10-CM | POA: Diagnosis not present

## 2022-12-04 DIAGNOSIS — E1122 Type 2 diabetes mellitus with diabetic chronic kidney disease: Secondary | ICD-10-CM | POA: Insufficient documentation

## 2022-12-04 DIAGNOSIS — N1832 Chronic kidney disease, stage 3b: Secondary | ICD-10-CM | POA: Diagnosis not present

## 2022-12-04 DIAGNOSIS — Z7901 Long term (current) use of anticoagulants: Secondary | ICD-10-CM | POA: Diagnosis not present

## 2022-12-04 DIAGNOSIS — E785 Hyperlipidemia, unspecified: Secondary | ICD-10-CM | POA: Diagnosis not present

## 2022-12-04 DIAGNOSIS — I2510 Atherosclerotic heart disease of native coronary artery without angina pectoris: Secondary | ICD-10-CM | POA: Diagnosis not present

## 2022-12-04 DIAGNOSIS — Z6841 Body Mass Index (BMI) 40.0 and over, adult: Secondary | ICD-10-CM | POA: Insufficient documentation

## 2022-12-04 DIAGNOSIS — J9611 Chronic respiratory failure with hypoxia: Secondary | ICD-10-CM | POA: Diagnosis not present

## 2022-12-04 NOTE — Patient Instructions (Addendum)
Good to see you today  No medication changes  Keep fluid intake to 32-48 oz/day  Your physician recommends that you schedule a follow-up appointment in: 1 year( November 2025) call office in September 2025 to schedule an appointment    If you have any questions or concerns before your next appointment please send [place] a message through [place] or call our office at [phone].    TO LEAVE A MESSAGE FOR THE NURSE SELECT OPTION 2, PLEASE LEAVE A MESSAGE INCLUDING: YOUR NAME DATE OF BIRTH CALL BACK NUMBER REASON FOR CALL**this is important as we prioritize the call backs  YOU WILL RECEIVE A CALL BACK THE SAME DAY AS LONG AS YOU CALL BEFORE 4:00 PM  If you have any questions or concerns before your next appointment please send [place] a message through [place] or call our office at [phone].    TO LEAVE A MESSAGE FOR THE NURSE SELECT OPTION 2, PLEASE LEAVE A MESSAGE INCLUDING: YOUR NAME DATE OF BIRTH CALL BACK NUMBER REASON FOR CALL**this is important as we prioritize the call backs  YOU WILL RECEIVE A CALL BACK THE SAME DAY AS LONG AS YOU CALL BEFORE 4:00 PM

## 2022-12-04 NOTE — Progress Notes (Signed)
Advanced Heart Failure Clinic Note   PCP: Pincus Sanes, MD Nephrology: Dr. Signe Colt HF Cardiologist: Dr. Gala Romney  Reason for visit: f/u for chronic diastolic heart failure   HPI: [name] is a 72 y.o. woman with history of morbid obesity Body mass index is 65.23 kg/m., DM2, HTN, HLD, mild-to- moderate CAD, asthma, and chronic dHF.  Cath 2/08: 50-60% LAD lesion and 30% RCA lesion.  EF 70%.  Mild pulmonary HTN primarily due to high output state.  PA mean 27 PCWP 10 PVR 1.8   Admitted 4/22 for HF Echo EF 60-65%. RV normal. She was diuresed and placed back on PO diuretics, switched from lasix 80 bid to torsemide 60 mg d/c. D/c wt 371 lb.   She saw pulmonology for OSA 10/02/20, sleep study ordered, low dose Lunesta added.  10/22 admitted with concerns of CO2 retention and HF Underwent RHC showing mild to moderate pulmonary hypertension, minimally elevated PCWP (18) and normal CO.  Echo 10/22 LVEF 65-70% RV normal RVSP  Sleep study 11/22 showed no OSA but + nocturnal desaturations to 84%, ONO ordered.  Acute visit 8/23, volume appeared up. CXR ordered by PCP showed mild pulmonary vascular congestion. Metolazone added weekly to her regimen. Follow up labs showed elevated SCr, metolazone stopped and torsemide reduced to 40 bid.   Saw vascular 10/23 with complaints of waxing and waning cool RLE. ABIs howed moderate R PAD, mild L PAD.  Echo 3/24 EF 65-70% RV normal  Today she returns for HF follow up with her husband. Overall feeling fine. Nephrology increased torsemide to 40 bid recently. She feels she has fluid on board today. Legs swelling. Occasional dizziness when standing quickly. She has SOB with ADLs, does OK if she takes her time. She is able to walk around her house some with her cane and rolling walker. Not very active. Denies palpitations, abnormal bleeding, CP, or PND/Orthopnea. Chronically sleeps in recliner. Appetite fair, has early satiety. On Mounjaro.Weight up 7  pounds in last 2 months. No fever or chills. Weight at home 319-325 pounds. Taking all medications. Uses CPAP occasionally, has PRN oxygen at home. No longer has HH aide, wears compression wraps.    Cardiac Studies:  - Echo 3/24: EF 65-70% RV normal  - Echo 10/22: EF 65-70% RV normal RVSP 23 mm HG  - RHC (10/22):  RA = 11 RV = 68/12 PA = 69/22 (39) PCW = 18 Fick cardiac output/index = 7.0/2.8 PVR = 3.0 WU Ao sat = 96% PA sat = 61%, 62% SVC sat 69% PAPi = 4.2  - Echo (10/22): EF 65-70%, grade I DD, RV ok  - Echo 4/22: EF 60-65%. RV ok.   ROS: All systems reviewed and negative except as per HPI.   Past Medical History:  Diagnosis Date   Anemia    Asthma    CAD (coronary artery disease)    Carpal tunnel syndrome    Cellulitis of both lower extremities 04/11/2015   CHF (congestive heart failure) (HCC)    Chronic kidney disease    CKI- followed by [facility]   Colon polyp, hyperplastic 2007 & 2012   Complication of anesthesia 1999   svt with renal calculi surgery, no problems since   CTS (carpal tunnel syndrome)    bilateral   Diabetes mellitus    Eczema    GERD (gastroesophageal reflux disease)    History of kidney stones 1999   Hyperlipidemia    Hypertension    Leg ulcer (HCC) 04/24/2015  Right lateral leg No evidence of an infection Monitor closely Keep edema controlled    Leg ulcer (HCC)    right lower   Meralgia paresthetica    Dr. Terrace Arabia   Morbid obesity [facility])    Neuropathy    toes and legs   Osteoarthrosis, unspecified whether generalized or localized, lower leg    knee   PUD (peptic ulcer disease)    Shortness of breath dyspnea    with exertion   Sleep apnea    per progress note 02/25/2018   Type II or unspecified type diabetes mellitus without mention of complication, not stated as uncontrolled    Unspecified hereditary and idiopathic peripheral neuropathy    Urticaria    Vitamin B12 deficiency    Wears glasses    Wound cellulitis    right  upper leg, healing well   Current Outpatient Medications  Medication Sig Dispense Refill   acetaminophen (TYLENOL) 650 MG CR tablet Take 1,300 mg by mouth every 8 (eight) hours as needed for pain.     Alcohol Swabs (DROPSAFE ALCOHOL PREP) 70 % PADS USE TOPICALLY AS DIRECTED 300 each 3   allopurinol (ZYLOPRIM) 100 MG tablet TAKE 2 TABLETS(200 MG) BY MOUTH DAILY 180 tablet 1   atorvastatin (LIPITOR) 40 MG tablet TAKE 1 TABLET(40 MG) BY MOUTH AT BEDTIME 90 tablet 2   azelastine (ASTELIN) 0.1 % nasal spray Place 2 sprays into both nostrils 2 (two) times daily. Use in each nostril as directed 30 mL 5   benzonatate (TESSALON) 200 MG capsule Take 1 capsule (200 mg total) by mouth 3 (three) times daily as needed for cough. 60 capsule 1   Blood Glucose Calibration (TRUE METRIX LEVEL 1) Low SOLN UAD  E11.9 1 each 3   Blood Glucose Monitoring Suppl (TRUE METRIX METER) DEVI True Metrix Level 1 solution     Budeson-Glycopyrrol-Formoterol (BREZTRI AEROSPHERE) 160-9-4.8 MCG/ACT AERO Inhale 2 puffs into the lungs in the morning and at bedtime. 10.7 g 5   calcium carbonate (TUMS - DOSED IN MG ELEMENTAL CALCIUM) 500 MG chewable tablet Chew 2 tablets by mouth daily as needed for indigestion or heartburn.     cyanocobalamin (,VITAMIN B-12,) 1000 MCG/ML injection INJECT INTO MUSCLE EVERY 30 DAYS 10 mL 5   dapagliflozin propanediol (FARXIGA) 10 MG TABS tablet Take 10 mg by mouth daily.     diclofenac Sodium (VOLTAREN) 1 % GEL Apply 1 application topically as needed (pain).     diphenhydrAMINE (BENADRYL) 12.5 MG/5ML liquid Take 25 mg by mouth 4 (four) times daily as needed for allergies.     ELIQUIS 2.5 MG TABS tablet TAKE 1 TABLET BY MOUTH TWICE DAILY 180 tablet 1   famotidine (PEPCID) 40 MG tablet Take 1 tablet (40 mg total) by mouth daily. 180 tablet 3   FEROSUL 325 (65 Fe) MG tablet TAKE 1 TABLET BY MOUTH DAILY ON MONDAYS WEDNESDAY AND FRIDAYS 60 tablet 0   fluticasone (FLONASE) 50 MCG/ACT nasal spray Place 2  sprays into both nostrils daily. 18.2 mL 2   gabapentin (NEURONTIN) 100 MG capsule TAKE 1 CAPSULE(100 MG) BY MOUTH THREE TIMES DAILY 90 capsule 1   HYDROcodone bit-homatropine (HYCODAN) 5-1.5 MG/5ML syrup Take 5 mLs by mouth every 8 (eight) hours as needed for cough. 120 mL 0   hydrOXYzine (ATARAX) 25 MG tablet Take 1 tablet (25 mg total) by mouth 3 (three) times daily as needed for itching. 90 tablet 1   montelukast (SINGULAIR) 10 MG tablet TAKE 1 TABLET(10  MG) BY MOUTH AT BEDTIME 30 tablet 5   nebivolol (BYSTOLIC) 5 MG tablet TAKE 1 TABLET(5 MG) BY MOUTH DAILY 30 tablet 3   Oxcarbazepine (TRILEPTAL) 300 MG tablet Take 1 tablet (300 mg total) by mouth 2 (two) times daily. 180 tablet 1   polyethylene glycol (MIRALAX / GLYCOLAX) 17 g packet Take 17 g by mouth daily as needed for moderate constipation.     potassium chloride SA (KLOR-CON M) 20 MEQ tablet TAKE 1 TABLET BY MOUTH DAILY AND 3 TABLETS BY MOUTH EVERY WEDNESDAY 102 tablet 3   Spacer/Aero-[facility] Use with inhaler. 1 each 2   spironolactone (ALDACTONE) 25 MG tablet TAKE 1 TABLET(25 MG) BY MOUTH DAILY 90 tablet 1   tirzepatide (MOUNJARO) 15 MG/0.5ML Pen Inject 15 mg into the skin once a week. 6 mL 2   torsemide (DEMADEX) 20 MG tablet TAKE 2 TABLETS(40 MG) BY MOUTH TWICE DAILY 360 tablet 3   triamcinolone ointment (KENALOG) 0.5 % Apply 1 Application topically as needed (knee pain).     UNABLE TO FIND Med Name: Oxygen portable at home 2-liters as needed     VENTOLIN HFA 108 (90 Base) MCG/ACT inhaler Inhale 2 puffs into the lungs every 4 (four) hours as needed for wheezing.     No current facility-administered medications for this encounter.   Allergies  Allergen Reactions   Penicillins Rash and Other (See Comments)    She was told not to take it anymore. Has patient had a PCN reaction causing immediate rash, facial/tongue/throat swelling, SOB or lightheadedness with hypotension: Yes Has patient had a PCN reaction causing  severe rash involving mucus membranes or skin necrosis: No Has patient had a PCN reaction that required hospitalization: No Has patient had a PCN reaction occurring within the last 10 years: No If all of the above answers are "NO", then may proceed with Cephalosporin use.'   Shellfish Allergy Anaphylaxis   Sulfonamide Derivatives Anaphylaxis and Other (See Comments)   Hydrochlorothiazide W-Triamterene Other (See Comments)    Hypokalemia   Lotensin [Benazepril Hcl] Hives   Dipyridamole Other (See Comments)    Unknown reaction   Estrogens Other (See Comments)    Unknown reaction   Hydrochlorothiazide Other (See Comments)    Unknown reaction, Hypokalemia?   Latex Other (See Comments)    Unknown reaction - stated previously during surgery gloves had to be changed, but unsure if it is still an allergy or not.    Metronidazole Other (See Comments)    Unknown reaction   Other Itching    PICKLES   Spironolactone Other (See Comments)    UNSPECIFIED > "kidney problems"   Torsemide Other (See Comments)    Unknown reaction   Valsartan Other (See Comments)    Unknown reaction   Mustard [Allyl Isothiocyanate] Itching   Social History   Socioeconomic History   Marital status: Married    Spouse name: Dwight   Number of children: 1   Years of education: BS   Highest education level: Not on file  Occupational History   Occupation: Disabled    Employer: RETIRED  Tobacco Use   Smoking status: Never   Smokeless tobacco: Never  Vaping Use   Vaping status: Never Used  Substance and Sexual Activity   Alcohol use: No    Alcohol/week: 0.0 standard drinks of alcohol   Drug use: No   Sexual activity: Not Currently  Other Topics Concern   Not on file  Social History Narrative   Patient  lives at home with her husband Karren Burly) . Patient is retired and has a Lawyer.    Caffeine - some times.   Right handed.   Social Determinants of Health   Financial Resource Strain: Low  Risk  (07/08/2022)   Overall Financial Resource Strain (CARDIA)    Difficulty of Paying Living Expenses: Not hard at all  Food Insecurity: No Food Insecurity (07/08/2022)   Hunger Vital Sign    Worried About Running Out of Food in the Last Year: Never true    Ran Out of Food in the Last Year: Never true  Transportation Needs: No Transportation Needs (07/08/2022)   PRAPARE - Administrator, Civil Service (Medical): No    Lack of Transportation (Non-Medical): No  Physical Activity: Inactive (07/08/2022)   Exercise Vital Sign    Days of Exercise per Week: 0 days    Minutes of Exercise per Session: 0 min  Stress: No Stress Concern Present (07/08/2022)   [employer] of Occupational Health - Occupational Stress Questionnaire    Feeling of Stress : Only a little  Social Connections: Socially Integrated (07/08/2022)   Social Connection and Isolation Panel [NHANES]    Frequency of Communication with Friends and Family: More than three times a week    Frequency of Social Gatherings with Friends and Family: Once a week    Attends Religious Services: More than 4 times per year    Active Member of [employer] or Organizations: Yes    Attends Banker Meetings: 1 to 4 times per year    Marital Status: Married  Catering manager Violence: Not At Risk (07/08/2022)   Humiliation, Afraid, Rape, and Kick questionnaire    Fear of Current or Ex-Partner: No    Emotionally Abused: No    Physically Abused: No    Sexually Abused: No   Family History  Problem Relation Age of Onset   Colon cancer Mother    Prostate cancer Father    Colon cancer Father    Diabetes Maternal Aunt    Breast cancer Maternal Aunt    Diabetes Maternal Uncle    Diabetes Paternal Aunt    Stroke Paternal Aunt        > 65   Heart disease Paternal Aunt    Diabetes Paternal Uncle    Breast cancer Maternal Aunt         X 2   Breast cancer Cousin    BP 94/66   Pulse 93   Wt (!) 146.1 kg (322 lb) Comment: Patient  weighed at home in the morning.  SpO2 97%   BMI 55.27 kg/m   Wt Readings from Last 3 Encounters:  12/04/22 (!) 146.1 kg (322 lb)  11/26/22 (!) 145.6 kg (321 lb)  10/07/22 (!) 142.9 kg (315 lb)   PHYSICAL EXAM: General:  NAD. No resp difficulty, arrived in [facility] HEENT: Normal Neck: Supple. No JVD, thick neck. Carotids 2+ bilat; no bruits. No lymphadenopathy or thryomegaly appreciated. Cor: PMI nondisplaced. Regular rate & rhythm. No rubs, gallops or murmurs. Lungs: Clear Abdomen: Obese, soft, nontender, nondistended. No hepatosplenomegaly. No bruits or masses. Good bowel sounds. Extremities: No cyanosis, clubbing, rash, chronic BLE lymphedema Neuro: Alert & oriented x 3, cranial nerves grossly intact. Moves all 4 extremities w/o difficulty. Affect pleasant.  ECG (personally reviewed): NSR 89 bpm  ASSESSMENT & PLAN: 1. Chronic Diastolic Heart Failure  - RHC 1610 c/w mild pulmonary hypertension, primarily due to high output state.  Her mean  pulmonary artery pressure was 27, wedge pressure was 10. PVR was 1.80 Woods units. - Echo (2017): LVEF 65-70%, G1DD, RV normal - Recent admit for a/c dCHF. Volume improved w/ IV Lasix.  - Echo (4/22): LVEF 60-65%, G2DD  - Echo (10/22): EF 65%, Grade 1 DD, RV normal. - RHC (10/22): w/ mild-mod PH, minimally elevated PCWP (18) - Echo 3/24 EF 65-70% RV normal - Chronic NYHA III,  functional limitation due mainlyby body habitus, asthma/chronic resp failure, and physical deconditioning. Volume status ok.  - Continue leg wraps. - Continue torsemide 40 mg bid, Off metolazone with worsening kidney function.  - Continue Farxiga 10 mg daily. No GU symptoms. - Continue spiro 25 mg daily.  - Continue Bystolic 5 mg daily.  - Recent labs reviewed and are stable.  2. Chronic Hypoxic Respiratory Failure - Suspect driven by OHS, split night sleep study showed no OSA. - Continue BiPAP qhs & continue oxygen during the day. - RHC (10/22): c/w mid-mod PH.  -  Respiratory status stable. - Continue oxygen.   3. Bilateral LE DVT (Diagnosed 4/22) - Confirmed by venous dopplers. Chest CT negative for PE. - Continue Eliquis. No bleeding issues.   4. CAD:  - LHC in 2008 showed 50-60% LAD lesion and 30% RCA lesion. - No s/s angina. - Continue ? blocker and statin. - No ASA given Eliquis for DVT tx.    5. Stage IIIb CKD - Baseline SCr ~1.5-1.7 - Last SCr 2.15 on 11/16/22  - Followed by Dr. Signe Colt at [facility] - Recheck today  6. PAD - ABIs with moderate disease on R - Followed by [facility]  7. Obesity - Body mass index is 55.27 kg/m. - Remains on Mounjaro   Follow up in 1 year   Jacklynn Ganong, FNP  10:48 AM

## 2022-12-07 ENCOUNTER — Other Ambulatory Visit: Payer: Self-pay | Admitting: Internal Medicine

## 2022-12-07 ENCOUNTER — Other Ambulatory Visit: Payer: Self-pay

## 2022-12-07 ENCOUNTER — Telehealth: Payer: Self-pay | Admitting: Internal Medicine

## 2022-12-07 MED ORDER — ATORVASTATIN CALCIUM 40 MG PO TABS: 40.0000 mg | ORAL_TABLET | Freq: Every day | ORAL | 2 refills | Status: DC

## 2022-12-07 MED ORDER — ALLOPURINOL 100 MG PO TABS: 100.0000 mg | ORAL_TABLET | Freq: Two times a day (BID) | ORAL | 1 refills | Status: DC

## 2022-12-07 NOTE — Telephone Encounter (Signed)
Prescription Request  12/07/2022  LOV: 07/03/2022  What is the name of the medication or equipment?  allopurinol (ZYLOPRIM) 100 MG tablet  atorvastatin (LIPITOR) 40 MG tablet [   Have you contacted your pharmacy to request a refill? No   Which pharmacy would you [id number] [place], [place] - [address] AT [address] [place] [address] Phone: [phone] Fax: [phone]  Patient notified that their request is being sent to the clinical staff for review and that they should receive a response within 2 business days.   Please advise at Mobile [phone] (mobile)

## 2022-12-07 NOTE — Telephone Encounter (Signed)
Sent in today 

## 2022-12-15 ENCOUNTER — Other Ambulatory Visit: Payer: Self-pay | Admitting: Internal Medicine

## 2022-12-15 ENCOUNTER — Telehealth: Payer: Self-pay | Admitting: Internal Medicine

## 2022-12-15 ENCOUNTER — Other Ambulatory Visit: Payer: Self-pay

## 2022-12-15 NOTE — Telephone Encounter (Signed)
Refill sent today.

## 2022-12-15 NOTE — Telephone Encounter (Signed)
Prescription Request  12/15/2022  LOV: 07/03/2022  What is the name of the medication or equipment? nebivolol (BYSTOLIC) 5 MG tablet  Pt would like a 90 day supply instead of the 30 days  Have you contacted your pharmacy to request a refill? No   Which pharmacy would you like this sent to?    [id number] - [place], [place] - [address] AT [address]  [place] [address] Phone: [phone] Fax: [phone]  Patient notified that their request is being sent to the clinical staff for review and that they should receive a response within 2 business days.   Please advise at Mobile [phone] (mobile)

## 2022-12-17 DIAGNOSIS — J9601 Acute respiratory failure with hypoxia: Secondary | ICD-10-CM | POA: Diagnosis not present

## 2022-12-17 DIAGNOSIS — J9602 Acute respiratory failure with hypercapnia: Secondary | ICD-10-CM | POA: Diagnosis not present

## 2022-12-17 DIAGNOSIS — I5033 Acute on chronic diastolic (congestive) heart failure: Secondary | ICD-10-CM | POA: Diagnosis not present

## 2022-12-18 ENCOUNTER — Other Ambulatory Visit: Payer: Self-pay

## 2022-12-18 ENCOUNTER — Telehealth: Payer: Self-pay | Admitting: Internal Medicine

## 2022-12-18 DIAGNOSIS — E1140 Type 2 diabetes mellitus with diabetic neuropathy, unspecified: Secondary | ICD-10-CM

## 2022-12-18 MED ORDER — TIRZEPATIDE 15 MG/0.5ML ~~LOC~~ SOAJ
15.0000 mg | SUBCUTANEOUS | 2 refills | Status: DC
Start: 2022-12-18 — End: 2023-02-08

## 2022-12-18 NOTE — Telephone Encounter (Signed)
This has been taken care of. Nfn

## 2022-12-18 NOTE — Telephone Encounter (Signed)
Spoke with patient today and all questions answered.  Script sent to mail order as requested.

## 2022-12-18 NOTE — Telephone Encounter (Signed)
April [name] called about her medication at [facility] that needs a new prescription sent out-- tirzepatide [facility]) 15 MG/0.5ML Pen  She was also wondering do her husband April [name] 7.30.48, Fayrene Fearing Page (DAD) 2.4.24, and her son [name] 05/08/1981 do any of the following need a RSV shot. April [name] saw this on Tvc and got curious about if her and her family needed it. Do we offer it here at our office? Please Advise. Thanks

## 2022-12-25 ENCOUNTER — Ambulatory Visit: Payer: Medicare PPO

## 2022-12-28 ENCOUNTER — Telehealth: Payer: Self-pay | Admitting: Internal Medicine

## 2022-12-28 ENCOUNTER — Ambulatory Visit: Payer: Medicare PPO

## 2022-12-28 ENCOUNTER — Ambulatory Visit (INDEPENDENT_AMBULATORY_CARE_PROVIDER_SITE_OTHER): Payer: Medicare PPO

## 2022-12-28 ENCOUNTER — Other Ambulatory Visit: Payer: Self-pay | Admitting: Internal Medicine

## 2022-12-28 ENCOUNTER — Other Ambulatory Visit: Payer: Self-pay

## 2022-12-28 DIAGNOSIS — E538 Deficiency of other specified B group vitamins: Secondary | ICD-10-CM

## 2022-12-28 MED ORDER — CYANOCOBALAMIN 1000 MCG/ML IJ SOLN
1000.0000 ug | Freq: Once | INTRAMUSCULAR | Status: AC
Start: 2022-12-28 — End: 2022-12-28
  Administered 2022-12-28: 1000 ug via INTRAMUSCULAR

## 2022-12-28 MED ORDER — MONTELUKAST SODIUM 10 MG PO TABS: ORAL_TABLET | ORAL | 5 refills | Status: DC

## 2022-12-28 NOTE — Telephone Encounter (Signed)
Prescription Request  12/28/2022  LOV: 07/03/2022  What is the name of the medication or equipment? montelukast  Have you contacted your pharmacy to request a refill? Yes   Which pharmacy would you like this sent to?  [id number] - [place], [place] - [address] AT [address] [place] [address] Phone: [phone] Fax: [phone]  Patient notified that their request is being sent to the clinical staff for review and that they should receive a response within 2 business days.   Please advise at Mobile [phone] (mobile)

## 2022-12-28 NOTE — Telephone Encounter (Signed)
Faxed in today.

## 2022-12-28 NOTE — Progress Notes (Signed)
PT visits today for their b-12 injection. PT informed of what they were receiving and tolerated injection well. PT notified to reach out to office if needed.

## 2023-01-04 DIAGNOSIS — L280 Lichen simplex chronicus: Secondary | ICD-10-CM | POA: Diagnosis not present

## 2023-01-04 DIAGNOSIS — L723 Sebaceous cyst: Secondary | ICD-10-CM | POA: Diagnosis not present

## 2023-01-07 ENCOUNTER — Encounter: Payer: Self-pay | Admitting: Internal Medicine

## 2023-01-07 NOTE — Patient Instructions (Addendum)
      Blood work was ordered.       Medications changes include :   None    Consider prolia for your osteoporosis.  Discuss with Dr Signe Colt to make sure that is ok with her.     Return in about 6 months (around 07/09/2023).

## 2023-01-07 NOTE — Progress Notes (Signed)
Subjective:    Patient ID: April [name], female    DOB: 1950/04/15, 72 y.o.   MRN: [id number]     HPI April [name] is here for follow up of her chronic medical problems.  Saw derm - dr drew Yetta Barre for boil - putting cream on it and taking keflex.  Cardio - advised decreasing fluid intake.  Leg swelling has been slightly worse, but decrease in the fluids has helped a little.    Medications and allergies reviewed with patient and updated if appropriate.  Current Outpatient Medications on File Prior to Visit  Medication Sig Dispense Refill   acetaminophen (TYLENOL) 650 MG CR tablet Take 1,300 mg by mouth every 8 (eight) hours as needed for pain.     Alcohol Swabs (DROPSAFE ALCOHOL PREP) 70 % PADS USE TOPICALLY AS DIRECTED 300 each 3   allopurinol (ZYLOPRIM) 100 MG tablet Take 1 tablet (100 mg total) by mouth 2 (two) times daily. TAKE 2 TABLETS(200 MG) BY MOUTH DAILY 180 tablet 1   atorvastatin (LIPITOR) 40 MG tablet Take 1 tablet (40 mg total) by mouth daily. 90 tablet 2   azelastine (ASTELIN) 0.1 % nasal spray Place 2 sprays into both nostrils 2 (two) times daily. Use in each nostril as directed 30 mL 5   benzonatate (TESSALON) 200 MG capsule Take 1 capsule (200 mg total) by mouth 3 (three) times daily as needed for cough. 60 capsule 1   Blood Glucose Calibration (TRUE METRIX LEVEL 1) Low SOLN UAD  E11.9 1 each 3   Blood Glucose Monitoring Suppl (TRUE METRIX METER) DEVI True Metrix Level 1 solution     Budeson-Glycopyrrol-Formoterol (BREZTRI AEROSPHERE) 160-9-4.8 MCG/ACT AERO Inhale 2 puffs into the lungs in the morning and at bedtime. 10.7 g 5   calcium carbonate (TUMS - DOSED IN MG ELEMENTAL CALCIUM) 500 MG chewable tablet Chew 2 tablets by mouth daily as needed for indigestion or heartburn.     cephALEXin (KEFLEX) 500 MG capsule Take 500 mg by mouth 2 (two) times daily.     cyanocobalamin (,VITAMIN B-12,) 1000 MCG/ML injection INJECT INTO MUSCLE EVERY 30 DAYS 10 mL 5    dapagliflozin propanediol (FARXIGA) 10 MG TABS tablet Take 10 mg by mouth daily.     diclofenac Sodium (VOLTAREN) 1 % GEL Apply 1 application topically as needed (pain).     diphenhydrAMINE (BENADRYL) 12.5 MG/5ML liquid Take 25 mg by mouth 4 (four) times daily as needed for allergies.     ELIQUIS 2.5 MG TABS tablet TAKE 1 TABLET BY MOUTH TWICE DAILY 180 tablet 1   famotidine (PEPCID) 40 MG tablet Take 1 tablet (40 mg total) by mouth daily. 180 tablet 3   FEROSUL 325 (65 Fe) MG tablet TAKE 1 TABLET BY MOUTH DAILY ON MONDAYS WEDNESDAY AND FRIDAYS 60 tablet 0   fluticasone (FLONASE) 50 MCG/ACT nasal spray Place 2 sprays into both nostrils daily. 18.2 mL 2   gabapentin (NEURONTIN) 100 MG capsule TAKE 1 CAPSULE(100 MG) BY MOUTH THREE TIMES DAILY 90 capsule 1   hydrOXYzine (ATARAX) 25 MG tablet Take 1 tablet (25 mg total) by mouth 3 (three) times daily as needed for itching. 90 tablet 1   mometasone (ELOCON) 0.1 % cream Apply topically 2 (two) times daily.     montelukast (SINGULAIR) 10 MG tablet TAKE 1 TABLET(10 MG) BY MOUTH AT BEDTIME 30 tablet 5   nebivolol (BYSTOLIC) 5 MG tablet TAKE 1 TABLET(5 MG) BY MOUTH DAILY 90 tablet 1  Oxcarbazepine (TRILEPTAL) 300 MG tablet Take 1 tablet (300 mg total) by mouth 2 (two) times daily. 180 tablet 1   polyethylene glycol (MIRALAX / GLYCOLAX) 17 g packet Take 17 g by mouth daily as needed for moderate constipation.     potassium chloride SA (KLOR-CON M) 20 MEQ tablet TAKE 1 TABLET BY MOUTH DAILY AND 3 TABLETS BY MOUTH EVERY WEDNESDAY 102 tablet 3   Spacer/Aero-Holding [facility] Use with inhaler. 1 each 2   spironolactone (ALDACTONE) 25 MG tablet TAKE 1 TABLET(25 MG) BY MOUTH DAILY 90 tablet 1   tirzepatide (MOUNJARO) 15 MG/0.5ML Pen Inject 15 mg into the skin once a week. 6 mL 2   torsemide (DEMADEX) 20 MG tablet TAKE 2 TABLETS(40 MG) BY MOUTH TWICE DAILY 360 tablet 3   triamcinolone ointment (KENALOG) 0.5 % Apply 1 Application topically as needed (knee  pain).     UNABLE TO FIND Med Name: Oxygen portable at home 2-liters as needed     VENTOLIN HFA 108 (90 Base) MCG/ACT inhaler Inhale 2 puffs into the lungs every 4 (four) hours as needed for wheezing.     No current facility-administered medications on file prior to visit.     Review of Systems  Constitutional:  Negative for fever.  Respiratory:  Positive for cough, shortness of breath and wheezing.   Cardiovascular:  Positive for leg swelling. Negative for chest pain and palpitations.  Gastrointestinal:        April [name] -   Neurological:  Positive for light-headedness (if she gets up too quick) and headaches (occ).       Objective:   Vitals:   01/08/23 1044  BP: 132/76  Pulse: 92  Temp: 98.5 F (36.9 C)  SpO2: 96%   BP Readings from Last 3 Encounters:  01/08/23 132/76  12/04/22 94/66  11/26/22 124/62   Wt Readings from Last 3 Encounters:  12/04/22 (!) 322 lb (146.1 kg)  11/26/22 (!) 321 lb (145.6 kg)  10/07/22 (!) 315 lb (142.9 kg)   Body mass index is 55.27 kg/m.    Physical Exam Constitutional:      General: She is not in acute distress.    Appearance: Normal appearance.  HENT:     Head: Normocephalic and atraumatic.  Eyes:     Conjunctiva/sclera: Conjunctivae normal.  Cardiovascular:     Rate and Rhythm: Normal rate and regular rhythm.     Heart sounds: Normal heart sounds.  Pulmonary:     Effort: Pulmonary effort is normal. No respiratory distress.     Breath sounds: Normal breath sounds. No wheezing.  Musculoskeletal:     Cervical back: Neck supple.     Right lower leg: Edema present.     Left lower leg: Edema present.  Lymphadenopathy:     Cervical: No cervical adenopathy.  Skin:    General: Skin is warm and dry.     Findings: No rash.  Neurological:     Mental Status: She is alert. Mental status is at baseline.  Psychiatric:        Mood and Affect: Mood normal.        Behavior: Behavior normal.        Lab Results  Component Value Date    WBC 5.1 08/12/2022   HGB 12.8 08/12/2022   HCT 41.4 08/12/2022   PLT 157 08/12/2022   GLUCOSE 91 11/16/2022   CHOL 127 07/03/2022   TRIG 91.0 07/03/2022   HDL 33.00 (L) 07/03/2022   LDLDIRECT 169.4 05/02/2007  LDLCALC 76 07/03/2022   ALT 11 08/12/2022   AST 14 (L) 08/12/2022   NA 138 11/16/2022   K 3.6 11/16/2022   CL 93 (L) 11/16/2022   CREATININE 2.15 (H) 11/16/2022   BUN 32 (H) 11/16/2022   CO2 33 (H) 11/16/2022   TSH 4.317 11/15/2020   INR 1.0 07/04/2006   HGBA1C 6.0 07/03/2022   MICROALBUR 1.8 08/29/2021     Assessment & Plan:    See Problem List for Assessment and Plan of chronic medical problems.

## 2023-01-08 ENCOUNTER — Ambulatory Visit: Payer: Medicare PPO | Admitting: Internal Medicine

## 2023-01-08 VITALS — BP 132/76 | HR 92 | Temp 98.5°F | Ht 64.0 in

## 2023-01-08 DIAGNOSIS — E782 Mixed hyperlipidemia: Secondary | ICD-10-CM

## 2023-01-08 DIAGNOSIS — I5032 Chronic diastolic (congestive) heart failure: Secondary | ICD-10-CM | POA: Diagnosis not present

## 2023-01-08 DIAGNOSIS — M109 Gout, unspecified: Secondary | ICD-10-CM | POA: Diagnosis not present

## 2023-01-08 DIAGNOSIS — M816 Localized osteoporosis [Lequesne]: Secondary | ICD-10-CM

## 2023-01-08 DIAGNOSIS — N1832 Chronic kidney disease, stage 3b: Secondary | ICD-10-CM

## 2023-01-08 DIAGNOSIS — Z7985 Long-term (current) use of injectable non-insulin antidiabetic drugs: Secondary | ICD-10-CM

## 2023-01-08 DIAGNOSIS — Z7984 Long term (current) use of oral hypoglycemic drugs: Secondary | ICD-10-CM

## 2023-01-08 DIAGNOSIS — K219 Gastro-esophageal reflux disease without esophagitis: Secondary | ICD-10-CM

## 2023-01-08 DIAGNOSIS — E538 Deficiency of other specified B group vitamins: Secondary | ICD-10-CM | POA: Diagnosis not present

## 2023-01-08 DIAGNOSIS — E1140 Type 2 diabetes mellitus with diabetic neuropathy, unspecified: Secondary | ICD-10-CM

## 2023-01-08 DIAGNOSIS — I10 Essential (primary) hypertension: Secondary | ICD-10-CM | POA: Diagnosis not present

## 2023-01-08 DIAGNOSIS — R600 Localized edema: Secondary | ICD-10-CM

## 2023-01-08 DIAGNOSIS — M170 Bilateral primary osteoarthritis of knee: Secondary | ICD-10-CM

## 2023-01-08 DIAGNOSIS — I82403 Acute embolism and thrombosis of unspecified deep veins of lower extremity, bilateral: Secondary | ICD-10-CM

## 2023-01-08 DIAGNOSIS — G609 Hereditary and idiopathic neuropathy, unspecified: Secondary | ICD-10-CM

## 2023-01-08 LAB — CBC WITH DIFFERENTIAL/PLATELET
Basophils Absolute: 0.1 10*3/uL (ref 0.0–0.1)
Basophils Relative: 1.3 % (ref 0.0–3.0)
Eosinophils Absolute: 0.2 10*3/uL (ref 0.0–0.7)
Eosinophils Relative: 2.7 % (ref 0.0–5.0)
HCT: 40.7 % (ref 36.0–46.0)
Hemoglobin: 13.8 g/dL (ref 12.0–15.0)
Lymphocytes Relative: 19.2 % (ref 12.0–46.0)
Lymphs Abs: 1.3 10*3/uL (ref 0.7–4.0)
MCHC: 33.8 g/dL (ref 30.0–36.0)
MCV: 97.9 fL (ref 78.0–100.0)
Monocytes Absolute: 0.6 10*3/uL (ref 0.1–1.0)
Monocytes Relative: 8.5 % (ref 3.0–12.0)
Neutro Abs: 4.6 10*3/uL (ref 1.4–7.7)
Neutrophils Relative %: 68.3 % (ref 43.0–77.0)
Platelets: 182 10*3/uL (ref 150.0–400.0)
RBC: 4.16 Mil/uL (ref 3.87–5.11)
RDW: 16 % — ABNORMAL HIGH (ref 11.5–15.5)
WBC: 6.7 10*3/uL (ref 4.0–10.5)

## 2023-01-08 LAB — COMPREHENSIVE METABOLIC PANEL
ALT: 13 U/L (ref 0–35)
AST: 18 U/L (ref 0–37)
Albumin: 3.7 g/dL (ref 3.5–5.2)
Alkaline Phosphatase: 113 U/L (ref 39–117)
BUN: 20 mg/dL (ref 6–23)
CO2: 34 meq/L — ABNORMAL HIGH (ref 19–32)
Calcium: 9.2 mg/dL (ref 8.4–10.5)
Chloride: 97 meq/L (ref 96–112)
Creatinine, Ser: 1.77 mg/dL — ABNORMAL HIGH (ref 0.40–1.20)
GFR: 28.35 mL/min — ABNORMAL LOW (ref >60.00)
Glucose, Bld: 84 mg/dL (ref 70–99)
Potassium: 4.2 meq/L (ref 3.5–5.1)
Sodium: 137 meq/L (ref 135–145)
Total Bilirubin: 0.6 mg/dL (ref 0.2–1.2)
Total Protein: 7.1 g/dL (ref 6.0–8.3)

## 2023-01-08 LAB — LIPID PANEL
Cholesterol: 128 mg/dL (ref 0–200)
HDL: 30.9 mg/dL — ABNORMAL LOW (ref >39.00)
LDL Cholesterol: 82 mg/dL (ref 0–99)
NonHDL: 97.07
Total CHOL/HDL Ratio: 4
Triglycerides: 74 mg/dL (ref 0.0–149.0)
VLDL: 14.8 mg/dL (ref 0.0–40.0)

## 2023-01-08 LAB — HEMOGLOBIN A1C: Hgb A1c MFr Bld: 5.7 % (ref 4.6–6.5)

## 2023-01-08 MED ORDER — FAMOTIDINE 40 MG PO TABS: 40.0000 mg | ORAL_TABLET | Freq: Two times a day (BID) | ORAL | 3 refills | Status: DC

## 2023-01-08 NOTE — Assessment & Plan Note (Addendum)
Chronic GERD not ideally controlled-does take Tums a lot Not compliant with a diabetic diet-stressed the importance of compliance with a diabetic diet to prevent further medication which may have more side effects Continue famotidine to 40 mg twice daily

## 2023-01-08 NOTE — Assessment & Plan Note (Addendum)
Chronic She still has leg edema, but to me it looks fairly controlled Encouraged as much activity as possible CMP Continue torsemide 40 mg twice daily  Continue spironolactone 25 mg daily

## 2023-01-08 NOTE — Assessment & Plan Note (Signed)
Chronic She is high risk for occurrence - long term a/c Continue eliquis 2.5 mg bid

## 2023-01-08 NOTE — Assessment & Plan Note (Signed)
Chronic Denies gout flares Continue allopurinol 200 mg daily

## 2023-01-08 NOTE — Assessment & Plan Note (Signed)
Chronic Blood pressure controlled Stressed low-sodium diet CMP Continue Bystolic 5 mg daily, spironolactone 25 mg daily, torsemide 40 mg twice daily

## 2023-01-08 NOTE — Assessment & Plan Note (Addendum)
Chronic Currently on Mounjaro 15 mg weekly Encourage as much activity as possible Healthy diet, small portions, low in sugars and carbs Discussed the [name] will work less because she has been on it for a while so she may need to making more effort to try to help with weight loss

## 2023-01-08 NOTE — Assessment & Plan Note (Signed)
Chronic Appears euvolemic-has chronic leg swelling Continue torsemide 40 mg twice daily Continue spironolactone 25 mg daily CMP Stressed low-sodium diet

## 2023-01-08 NOTE — Assessment & Plan Note (Addendum)
Chronic DEXA up-to-date Discussed Prolia-I think she needs to start this and she would agree to start it She needs to discuss with Dr. Signe Colt to make sure she feels this is okay-she will do that at her next appointment since this is not urgent

## 2023-01-08 NOTE — Assessment & Plan Note (Signed)
Chronic   Lab Results  Component Value Date   HGBA1C 6.0 07/03/2022   Sugars well controlled Check A1c Continue Farxiga 10 mg daily, Mounjaro 15 mg weekly Stressed regular exercise, diabetic diet

## 2023-01-08 NOTE — Assessment & Plan Note (Signed)
Chronic Regular exercise and healthy diet encouraged Check lipid panel  Continue atorvastatin 40 mg daily 

## 2023-01-08 NOTE — Assessment & Plan Note (Signed)
Chronic Following with Dr. Signe Colt Continue April [name] 10 mg daily, torsemide 40 mg twice daily CBC, CMP Continue weight loss efforts

## 2023-01-08 NOTE — Assessment & Plan Note (Signed)
Chronic ?Continue monthly B12 injections ? ?

## 2023-01-08 NOTE — Assessment & Plan Note (Signed)
Chronic Continue gabapentin 100 mg 3 times daily, Trileptal 300 mg twice daily

## 2023-01-12 ENCOUNTER — Telehealth: Payer: Self-pay | Admitting: Internal Medicine

## 2023-01-12 NOTE — Telephone Encounter (Signed)
Pt called wanting Dr. Lawerance Bach to know that her bowels has been really dark. Please advise.

## 2023-01-12 NOTE — Telephone Encounter (Signed)
Patient has appointment on tomorrow to be seen.

## 2023-01-13 ENCOUNTER — Ambulatory Visit: Payer: Medicare PPO | Admitting: Internal Medicine

## 2023-01-16 DIAGNOSIS — J9602 Acute respiratory failure with hypercapnia: Secondary | ICD-10-CM | POA: Diagnosis not present

## 2023-01-16 DIAGNOSIS — J9601 Acute respiratory failure with hypoxia: Secondary | ICD-10-CM | POA: Diagnosis not present

## 2023-01-16 DIAGNOSIS — I5033 Acute on chronic diastolic (congestive) heart failure: Secondary | ICD-10-CM | POA: Diagnosis not present

## 2023-01-21 ENCOUNTER — Ambulatory Visit: Payer: Medicare PPO

## 2023-01-21 DIAGNOSIS — E538 Deficiency of other specified B group vitamins: Secondary | ICD-10-CM

## 2023-01-21 MED ORDER — CYANOCOBALAMIN 1000 MCG/ML IJ SOLN
1000.0000 ug | Freq: Once | INTRAMUSCULAR | Status: AC
  Administered 2023-01-21: 1000 ug via INTRAMUSCULAR

## 2023-01-21 NOTE — Progress Notes (Signed)
After obtaining consent, and per orders of Dr. Lawerance Bach, injection of B12 was given by Lorelle Gibbs, RN. Patient tolerated well instructed to report any adverse reaction to me immediately.

## 2023-01-22 DIAGNOSIS — H2513 Age-related nuclear cataract, bilateral: Secondary | ICD-10-CM | POA: Diagnosis not present

## 2023-01-22 DIAGNOSIS — H04122 Dry eye syndrome of left lacrimal gland: Secondary | ICD-10-CM | POA: Diagnosis not present

## 2023-01-22 DIAGNOSIS — H5213 Myopia, bilateral: Secondary | ICD-10-CM | POA: Diagnosis not present

## 2023-01-22 DIAGNOSIS — E119 Type 2 diabetes mellitus without complications: Secondary | ICD-10-CM | POA: Diagnosis not present

## 2023-01-22 DIAGNOSIS — H25013 Cortical age-related cataract, bilateral: Secondary | ICD-10-CM | POA: Diagnosis not present

## 2023-01-22 DIAGNOSIS — H52203 Unspecified astigmatism, bilateral: Secondary | ICD-10-CM | POA: Diagnosis not present

## 2023-01-22 LAB — HM DIABETES EYE EXAM

## 2023-01-25 ENCOUNTER — Other Ambulatory Visit: Payer: Self-pay | Admitting: Internal Medicine

## 2023-02-01 ENCOUNTER — Other Ambulatory Visit: Payer: Self-pay | Admitting: Internal Medicine

## 2023-02-01 DIAGNOSIS — Z1231 Encounter for screening mammogram for malignant neoplasm of breast: Secondary | ICD-10-CM

## 2023-02-08 ENCOUNTER — Other Ambulatory Visit: Payer: Self-pay | Admitting: Internal Medicine

## 2023-02-08 DIAGNOSIS — E1140 Type 2 diabetes mellitus with diabetic neuropathy, unspecified: Secondary | ICD-10-CM

## 2023-02-16 DIAGNOSIS — I5033 Acute on chronic diastolic (congestive) heart failure: Secondary | ICD-10-CM | POA: Diagnosis not present

## 2023-02-16 DIAGNOSIS — J9601 Acute respiratory failure with hypoxia: Secondary | ICD-10-CM | POA: Diagnosis not present

## 2023-02-16 DIAGNOSIS — J9602 Acute respiratory failure with hypercapnia: Secondary | ICD-10-CM | POA: Diagnosis not present

## 2023-02-17 ENCOUNTER — Other Ambulatory Visit: Payer: Self-pay

## 2023-02-18 ENCOUNTER — Ambulatory Visit: Payer: Medicare PPO

## 2023-02-22 ENCOUNTER — Ambulatory Visit (INDEPENDENT_AMBULATORY_CARE_PROVIDER_SITE_OTHER): Payer: Medicare PPO

## 2023-02-22 DIAGNOSIS — E538 Deficiency of other specified B group vitamins: Secondary | ICD-10-CM

## 2023-02-22 MED ORDER — CYANOCOBALAMIN 1000 MCG/ML IJ SOLN
1000.0000 ug | Freq: Once | INTRAMUSCULAR | Status: AC
  Administered 2023-02-22: 1000 ug via INTRAMUSCULAR

## 2023-02-22 NOTE — Progress Notes (Signed)
Pt was given B12 injec w/o any complications.

## 2023-02-26 ENCOUNTER — Encounter: Payer: Self-pay | Admitting: Podiatry

## 2023-02-26 ENCOUNTER — Ambulatory Visit: Payer: Medicare PPO | Admitting: Nurse Practitioner

## 2023-02-26 ENCOUNTER — Ambulatory Visit: Payer: Medicare PPO | Admitting: Podiatry

## 2023-02-26 DIAGNOSIS — M79674 Pain in right toe(s): Secondary | ICD-10-CM | POA: Diagnosis not present

## 2023-02-26 DIAGNOSIS — Z7901 Long term (current) use of anticoagulants: Secondary | ICD-10-CM

## 2023-02-26 DIAGNOSIS — M79675 Pain in left toe(s): Secondary | ICD-10-CM

## 2023-02-26 DIAGNOSIS — B351 Tinea unguium: Secondary | ICD-10-CM | POA: Diagnosis not present

## 2023-03-01 ENCOUNTER — Other Ambulatory Visit: Payer: Self-pay | Admitting: Internal Medicine

## 2023-03-01 ENCOUNTER — Telehealth: Payer: Self-pay | Admitting: Student

## 2023-03-01 ENCOUNTER — Other Ambulatory Visit: Payer: Self-pay | Admitting: Nurse Practitioner

## 2023-03-01 DIAGNOSIS — J4541 Moderate persistent asthma with (acute) exacerbation: Secondary | ICD-10-CM

## 2023-03-01 NOTE — Telephone Encounter (Signed)
PT calling now for Benzonate refill. Order in from [place].   Pharm is [facility] on [address]

## 2023-03-01 NOTE — Telephone Encounter (Signed)
Copied from CRM [id number]. Topic: Clinical - Medication Refill >> Mar 01, 2023  9:13 AM Efraim Kaufmann C wrote: Most Recent Primary Care Visit:  Provider: Delsa Grana R  Department: [facility]  Visit Type: CLINICAL SUPPORT  Date: 02/22/2023  Medication: FEROSUL 325 (65 Fe) MG tablet  Has the patient contacted their pharmacy? No (Agent: If no, request that the patient contact the pharmacy for the refill. If patient does not wish to contact the pharmacy document the reason why and proceed with request.) (Agent: If yes, when and what did the pharmacy advise?)  Is this the correct pharmacy for this prescription? Yes If no, delete pharmacy and type the correct one.  This is the patient's preferred pharmacy:  [facility] [address] [place] - [address] [place] [address] Phone: [phone] Fax: [phone]  Has the prescription been filled recently? No  Is the patient out of the medication? No  Has the patient been seen for an appointment in the last year OR does the patient have an upcoming appointment? Yes  Can we respond through MyChart? No  Agent: Please be advised that Rx refills may take up to 3 business days. We ask that you follow-up with your pharmacy.

## 2023-03-01 NOTE — Progress Notes (Signed)
Subjective: Chief Complaint  Patient presents with   [facility]    RM#11 DFC Right toe swelling started this week no new injury.     73 y.o. returns the office today for painful, elongated, thickened toenails which she cannot trim herself. Denies any redness or drainage around the nails.  No new pedal complaints today.  On Eliquis   PCP: Pincus Sanes, MD Last Seen: 01/08/2023  A1c: 5.7 on 12/6/024  Objective: AAO 3, NAD- in wheelchair DP/PT pulses decreased- feet are warm and perfused.  Chronic lower extremity edema present.  Sensation decreased bilaterally.  Nails hypertrophic, dystrophic, elongated, brittle, discolored 10. There is tenderness overlying the nails 1-5 bilaterally. There is no surrounding erythema or drainage along the nail sites. No open lesions or pre-ulcerative lesions are identified. No pain with calf compression, swelling, warmth, erythema. Overall, stable.   Assessment: Patient presents with symptomatic onychomycosis  Plan: -Treatment options including alternatives, risks, complications were discussed -Nails sharply debrided 10 without complication/bleeding. -Discussed daily foot inspection. If there are any changes, to call the office immediately.  -Follow-up in 3 months or sooner if any problems are to arise. In the meantime, encouraged to call the office with any questions, concerns, changes symptoms.  Return in about 3 months (around 05/27/2023).  Ovid Curd, DPM

## 2023-03-02 ENCOUNTER — Telehealth: Payer: Self-pay | Admitting: Student

## 2023-03-02 NOTE — Telephone Encounter (Signed)
PT needs Markus Daft called in to [place], [place]  Fax is [phone]

## 2023-03-02 NOTE — Telephone Encounter (Signed)
Tessalon refill sent

## 2023-03-03 ENCOUNTER — Telehealth: Payer: Self-pay

## 2023-03-03 ENCOUNTER — Other Ambulatory Visit (HOSPITAL_COMMUNITY): Payer: Self-pay

## 2023-03-03 MED ORDER — BREZTRI AEROSPHERE 160-9-4.8 MCG/ACT IN AERO: 2.0000 | INHALATION_SPRAY | Freq: Two times a day (BID) | RESPIRATORY_TRACT | 5 refills | Status: DC

## 2023-03-03 NOTE — Telephone Encounter (Signed)
Refill sent.

## 2023-03-03 NOTE — Telephone Encounter (Signed)
Pharmacy Patient Advocate Encounter   Received notification from CoverMyMeds that prior authorization for Mounjaro 15MG /0.5ML auto-injectors is required/requested.   Insurance verification completed.   The patient is insured through [employer] .   Per test claim: Refill too soon. PA is not needed at this time. Medication was filled 02-02-2023. Next eligible fill date is 04-06-2023.

## 2023-03-17 ENCOUNTER — Ambulatory Visit: Payer: Medicare PPO | Admitting: Nurse Practitioner

## 2023-03-18 ENCOUNTER — Telehealth: Payer: Self-pay | Admitting: Internal Medicine

## 2023-03-18 NOTE — Telephone Encounter (Signed)
Spoke with patient today.

## 2023-03-18 NOTE — Telephone Encounter (Signed)
Copied from CRM [id number]. Topic: Clinical - Medical Advice >> Mar 18, 2023 10:34 AM Theodis Sato wrote: Reason for CRM: Patient is requesting a call back from Dr. Lawerance Bach nurse as she needs to know what multivitamin her doctor would recommend for her.

## 2023-03-19 DIAGNOSIS — J9601 Acute respiratory failure with hypoxia: Secondary | ICD-10-CM | POA: Diagnosis not present

## 2023-03-19 DIAGNOSIS — J9602 Acute respiratory failure with hypercapnia: Secondary | ICD-10-CM | POA: Diagnosis not present

## 2023-03-19 DIAGNOSIS — I5033 Acute on chronic diastolic (congestive) heart failure: Secondary | ICD-10-CM | POA: Diagnosis not present

## 2023-03-25 ENCOUNTER — Ambulatory Visit: Payer: Medicare PPO

## 2023-03-26 ENCOUNTER — Ambulatory Visit: Payer: Medicare PPO

## 2023-03-26 DIAGNOSIS — E538 Deficiency of other specified B group vitamins: Secondary | ICD-10-CM | POA: Diagnosis not present

## 2023-03-26 MED ORDER — CYANOCOBALAMIN 1000 MCG/ML IJ SOLN
1000.0000 ug | Freq: Once | INTRAMUSCULAR | Status: AC
  Administered 2023-03-26: 1000 ug via INTRAMUSCULAR

## 2023-03-26 NOTE — Progress Notes (Signed)
 Patient visits today for their b-12 injection. Patient informed of what they had received and tolerated injection well. Patient notified to reach out to office if needed.

## 2023-04-01 ENCOUNTER — Telehealth: Payer: Self-pay

## 2023-04-01 NOTE — Telephone Encounter (Signed)
 Copied from CRM [id number]. Topic: Clinical - Medical Advice >> Apr 01, 2023  9:39 AM Efraim Kaufmann C wrote: Reason for CRM: patient has an appointment on 04/13/2023 regarding her ears and she was wondering if she has an ear problem or build up of wax if she is going to be sent to an Ear, Nose, and Throat Specialist. If so, she is wondering if she can just go ahead and be sent to the Ear, Nose, and Throat Specialist without having to have 2 appointments and wait. I did let her know she may have to have the appointment with her PCP regardless in order to get the referral. Please advise with patient. Thank you.

## 2023-04-01 NOTE — Telephone Encounter (Signed)
 Called and informed patient that this would be the case. Patient is following up with [place] on March 11th to see Dr.Burns for ear issues as well as recent issues with thumb mobility.

## 2023-04-02 ENCOUNTER — Ambulatory Visit: Payer: Medicare PPO

## 2023-04-02 DIAGNOSIS — L84 Corns and callosities: Secondary | ICD-10-CM

## 2023-04-02 DIAGNOSIS — M2141 Flat foot [pes planus] (acquired), right foot: Secondary | ICD-10-CM

## 2023-04-02 DIAGNOSIS — E1142 Type 2 diabetes mellitus with diabetic polyneuropathy: Secondary | ICD-10-CM

## 2023-04-02 DIAGNOSIS — I890 Lymphedema, not elsewhere classified: Secondary | ICD-10-CM

## 2023-04-02 NOTE — Progress Notes (Signed)
 Patient presents to the office today for diabetic shoe and insole measuring.  Patient was measured with brannock device to determine size and width for 1 pair of extra depth shoes and re-order custom inserts from 2024   Documentation of medical necessity will be sent to patient's treating diabetic doctor to verify and sign.   Patient's diabetic provider: Cheryll Cockayne   Shoes and insoles will be ordered at that time and patient will be notified for an appointment for fitting when they arrive.   Shoe size (per patient): 12 Shoe choice:   843 and 827 both ordered Shoe size ordered: 12XXW  ABN / ppw signed

## 2023-04-13 ENCOUNTER — Encounter: Payer: Self-pay | Admitting: Internal Medicine

## 2023-04-13 ENCOUNTER — Ambulatory Visit: Payer: Medicare PPO | Admitting: Internal Medicine

## 2023-04-13 VITALS — BP 128/78 | HR 80 | Temp 98.7°F | Ht 64.0 in

## 2023-04-13 DIAGNOSIS — H9203 Otalgia, bilateral: Secondary | ICD-10-CM | POA: Insufficient documentation

## 2023-04-13 DIAGNOSIS — I10 Essential (primary) hypertension: Secondary | ICD-10-CM

## 2023-04-13 DIAGNOSIS — L299 Pruritus, unspecified: Secondary | ICD-10-CM | POA: Diagnosis not present

## 2023-04-13 NOTE — Assessment & Plan Note (Signed)
 Acute Recently had ear pain both ears Your pain has resolved Exam normal-no excessive cerumen or evidence of infection Possible eustachian tube dysfunction Discussed TMJ, but did not have any pain with chewing or talking so unlikely Discussed neuralgia from neck since she was having right-sided headache Pain has resolved so no further evaluation or treatment at this time She will let me know if her symptoms recur

## 2023-04-13 NOTE — Assessment & Plan Note (Signed)
 Chronic Blood pressure controlled Continue Bystolic 5 mg daily, spironolactone 25 mg daily, torsemide 40 mg twice daily

## 2023-04-13 NOTE — Assessment & Plan Note (Signed)
 Acute Experiencing itching mostly external ear and in the opening of the ear canal No significant dryness of the skin Minimal cerumen at the opening of the ear canal No erythema Discussed it could be dry skin, eczema, allergies or wax related Can try tea tree oil or sweet oil to the external ear and ear canal opening or over-the-counter cortisone 1% cream as needed

## 2023-04-13 NOTE — Progress Notes (Signed)
 Subjective:    Patient ID: April [name], female    DOB: Jun 16, 1950, 73 y.o.   MRN: [id number]      HPI [name] is here for  Chief Complaint  Patient presents with   Ear Pain    Bilateral ear pain with itching sometimes; feels popping sensation in both ears sometimes    Ear issues for at least 2 weeks - it initially was right ear pain and she had a headache on the right side - worse with head movements - then it went away.  Then the left ear started hurting and then the right ear started hurting.    She has itching in both ears.  Right now she denies any headache or ear pain.  She denies cold symptoms.     Medications and allergies reviewed with patient and updated if appropriate.  Current Outpatient Medications on File Prior to Visit  Medication Sig Dispense Refill   acetaminophen (TYLENOL) 650 MG CR tablet Take 1,300 mg by mouth every 8 (eight) hours as needed for pain.     Alcohol Swabs (DROPSAFE ALCOHOL PREP) 70 % PADS USE TOPICALLY AS DIRECTED 300 each 3   allopurinol (ZYLOPRIM) 100 MG tablet Take 1 tablet (100 mg total) by mouth 2 (two) times daily. TAKE 2 TABLETS(200 MG) BY MOUTH DAILY 180 tablet 1   atorvastatin (LIPITOR) 40 MG tablet Take 1 tablet (40 mg total) by mouth daily. 90 tablet 2   azelastine (ASTELIN) 0.1 % nasal spray Place 2 sprays into both nostrils 2 (two) times daily. Use in each nostril as directed 30 mL 5   benzonatate (TESSALON) 200 MG capsule TAKE 1 CAPSULE(200 MG) BY MOUTH THREE TIMES DAILY AS NEEDED FOR COUGH 60 capsule 1   Blood Glucose Calibration (TRUE METRIX LEVEL 1) Low SOLN UAD  E11.9 1 each 3   Blood Glucose Monitoring Suppl (TRUE METRIX METER) DEVI True Metrix Level 1 solution     Budeson-Glycopyrrol-Formoterol (BREZTRI AEROSPHERE) 160-9-4.8 MCG/ACT AERO Inhale 2 puffs into the lungs in the morning and at bedtime. 10.7 g 5   calcium carbonate (TUMS - DOSED IN MG ELEMENTAL CALCIUM) 500 MG chewable tablet Chew 2 tablets by mouth daily as  needed for indigestion or heartburn.     cephALEXin (KEFLEX) 500 MG capsule Take 500 mg by mouth 2 (two) times daily.     cyanocobalamin (,VITAMIN B-12,) 1000 MCG/ML injection INJECT INTO MUSCLE EVERY 30 DAYS 10 mL 5   dapagliflozin propanediol (FARXIGA) 10 MG TABS tablet Take 10 mg by mouth daily.     diclofenac Sodium (VOLTAREN) 1 % GEL Apply 1 application topically as needed (pain).     diphenhydrAMINE (BENADRYL) 12.5 MG/5ML liquid Take 25 mg by mouth 4 (four) times daily as needed for allergies.     ELIQUIS 2.5 MG TABS tablet TAKE 1 TABLET BY MOUTH TWICE DAILY 180 tablet 1   famotidine (PEPCID) 40 MG tablet Take 1 tablet (40 mg total) by mouth 2 (two) times daily. 180 tablet 3   FEROSUL 325 (65 Fe) MG tablet TAKE 1 TABLET BY MOUTH ON MONDAYS, WEDNESDAY, AND FRIDAY 60 tablet 0   fluticasone (FLONASE) 50 MCG/ACT nasal spray Place 2 sprays into both nostrils daily. 18.2 mL 2   gabapentin (NEURONTIN) 100 MG capsule TAKE 1 CAPSULE(100 MG) BY MOUTH THREE TIMES DAILY 90 capsule 1   hydrOXYzine (ATARAX) 25 MG tablet Take 1 tablet (25 mg total) by mouth 3 (three) times daily as needed for itching. 90  tablet 1   mometasone (ELOCON) 0.1 % cream Apply topically 2 (two) times daily.     montelukast (SINGULAIR) 10 MG tablet TAKE 1 TABLET(10 MG) BY MOUTH AT BEDTIME 30 tablet 5   nebivolol (BYSTOLIC) 5 MG tablet TAKE 1 TABLET(5 MG) BY MOUTH DAILY 90 tablet 1   Oxcarbazepine (TRILEPTAL) 300 MG tablet TAKE 1 TABLET(300 MG) BY MOUTH TWICE DAILY 180 tablet 0   polyethylene glycol (MIRALAX / GLYCOLAX) 17 g packet Take 17 g by mouth daily as needed for moderate constipation.     potassium chloride SA (KLOR-CON M) 20 MEQ tablet TAKE 1 TABLET BY MOUTH DAILY AND 3 TABLETS BY MOUTH EVERY WEDNESDAY 102 tablet 3   Spacer/Aero-Holding [facility] Use with inhaler. 1 each 2   spironolactone (ALDACTONE) 25 MG tablet TAKE 1 TABLET(25 MG) BY MOUTH DAILY 90 tablet 1   tirzepatide (MOUNJARO) 15 MG/0.5ML Pen INJECT 15MG   (1 PEN) UNDER THE SKIN EVERY WEEK 6 mL 3   torsemide (DEMADEX) 20 MG tablet TAKE 2 TABLETS(40 MG) BY MOUTH TWICE DAILY 360 tablet 3   triamcinolone ointment (KENALOG) 0.5 % Apply 1 Application topically as needed (knee pain).     UNABLE TO FIND Med Name: Oxygen portable at home 2-liters as needed     VENTOLIN HFA 108 (90 Base) MCG/ACT inhaler Inhale 2 puffs into the lungs every 4 (four) hours as needed for wheezing.     No current facility-administered medications on file prior to visit.    Review of Systems  Constitutional:  Negative for fever.  HENT:  Positive for ear pain. Negative for congestion, ear discharge, hearing loss and sinus pressure.   Musculoskeletal:  Negative for neck pain.  Neurological:  Positive for headaches (resolved). Negative for dizziness.       Objective:   Vitals:   04/13/23 1358  BP: 128/78  Pulse: 80  Temp: 98.7 F (37.1 C)  SpO2: 97%   BP Readings from Last 3 Encounters:  04/13/23 128/78  01/08/23 132/76  12/04/22 94/66   Wt Readings from Last 3 Encounters:  12/04/22 (!) 322 lb (146.1 kg)  11/26/22 (!) 321 lb (145.6 kg)  10/07/22 (!) 315 lb (142.9 kg)   Body mass index is 55.27 kg/m.    Physical Exam Constitutional:      General: She is not in acute distress.    Appearance: Normal appearance. She is not ill-appearing.  HENT:     Head: Normocephalic and atraumatic.     Right Ear: Tympanic membrane, ear canal and external ear normal. There is no impacted cerumen.     Left Ear: Tympanic membrane, ear canal and external ear normal. There is no impacted cerumen.     Mouth/Throat:     Mouth: Mucous membranes are moist.     Pharynx: No oropharyngeal exudate or posterior oropharyngeal erythema.  Musculoskeletal:     Cervical back: Neck supple. No tenderness.  Lymphadenopathy:     Cervical: No cervical adenopathy.  Skin:    General: Skin is warm and dry.     Findings: No rash.  Neurological:     Mental Status: She is alert.             Assessment & Plan:    See Problem List for Assessment and Plan of chronic medical problems.

## 2023-04-13 NOTE — Patient Instructions (Addendum)
     Ear itching - wax, allergies, eczema - you can tea tree oil or sweet oil or cortisone cream       Medications changes include :   None       Return if symptoms worsen or fail to improve.

## 2023-04-16 DIAGNOSIS — I5033 Acute on chronic diastolic (congestive) heart failure: Secondary | ICD-10-CM | POA: Diagnosis not present

## 2023-04-16 DIAGNOSIS — J9602 Acute respiratory failure with hypercapnia: Secondary | ICD-10-CM | POA: Diagnosis not present

## 2023-04-16 DIAGNOSIS — J9601 Acute respiratory failure with hypoxia: Secondary | ICD-10-CM | POA: Diagnosis not present

## 2023-04-21 DIAGNOSIS — H2513 Age-related nuclear cataract, bilateral: Secondary | ICD-10-CM | POA: Diagnosis not present

## 2023-04-22 ENCOUNTER — Ambulatory Visit
Admission: EM | Admit: 2023-04-22 | Discharge: 2023-04-22 | Disposition: A | Discharge status: Home / Self Care | Attending: Physician Assistant | Admitting: Physician Assistant

## 2023-04-22 ENCOUNTER — Ambulatory Visit: Payer: Self-pay | Admitting: Internal Medicine

## 2023-04-22 DIAGNOSIS — N3000 Acute cystitis without hematuria: Secondary | ICD-10-CM | POA: Diagnosis not present

## 2023-04-22 LAB — POCT URINALYSIS DIP (MANUAL ENTRY)
Bilirubin, UA: NEGATIVE
Blood, UA: NEGATIVE
Glucose, UA: NEGATIVE mg/dL
Ketones, POC UA: NEGATIVE mg/dL
Nitrite, UA: NEGATIVE
Protein Ur, POC: NEGATIVE mg/dL
Spec Grav, UA: 1.025 (ref 1.010–1.025)
Urobilinogen, UA: 0.2 U/dL
pH, UA: 6 (ref 5.0–8.0)

## 2023-04-22 MED ORDER — CIPROFLOXACIN HCL 500 MG PO TABS: 500.0000 mg | ORAL_TABLET | Freq: Two times a day (BID) | ORAL | 0 refills | Status: DC

## 2023-04-22 NOTE — ED Provider Notes (Signed)
 [facility]    CSN: [id number] Arrival date & time: 04/22/23  1524      History   Chief Complaint Chief Complaint  Patient presents with   UTI Symptoms   Vaginal Problem    HPI April [name] is a 72 y.o. female.   Patient presents today for evaluation of pain in her vaginal area and dysuria.  She reports she also has pain in her bladder area.  She denies any known hematuria.  She has not any nausea or vomiting or fever.  She does not report treatment for symptoms.  The history is provided by the patient.    Past Medical History:  Diagnosis Date   Anemia    Asthma    CAD (coronary artery disease)    Carpal tunnel syndrome    Cellulitis of both lower extremities 04/11/2015   CHF (congestive heart failure) (HCC)    Chronic kidney disease    CKI- followed by [facility]   Colon polyp, hyperplastic 2007 & 2012   Complication of anesthesia 1999   svt with renal calculi surgery, no problems since   CTS (carpal tunnel syndrome)    bilateral   Diabetes mellitus    Eczema    GERD (gastroesophageal reflux disease)    History of kidney stones 1999   Hyperlipidemia    Hypertension    Leg ulcer (HCC) 04/24/2015   Right lateral leg No evidence of an infection Monitor closely Keep edema controlled    Leg ulcer (HCC)    right lower   Meralgia paresthetica    Dr. Terrace Arabia   Morbid obesity (HCC)    Neuropathy    toes and legs   Osteoarthrosis, unspecified whether generalized or localized, lower leg    knee   PUD (peptic ulcer disease)    Shortness of breath dyspnea    with exertion   Sleep apnea    per progress note 02/25/2018   Type II or unspecified type diabetes mellitus without mention of complication, not stated as uncontrolled    Unspecified hereditary and idiopathic peripheral neuropathy    Urticaria    Vitamin B12 deficiency    Wears glasses    Wound cellulitis    right upper leg, healing well    Patient Active Problem List   Diagnosis Date  Noted   Ear itching 04/13/2023   Ear pain, bilateral 04/13/2023   Left hip pain 08/20/2022   Cellulitis 05/04/2022   Pain in left ankle and joints of left foot 01/22/2022   Unilateral primary osteoarthritis, left knee 07/25/2021   Seasonal allergic rhinitis 05/16/2021   Fall 05/16/2021   Benign neoplasm of ascending colon    Nocturnal hypoxemia due to obesity 01/03/2021   Thrombocytopenia (HCC) 12/12/2020   Pain in left knee 12/05/2020   Chronic respiratory failure with hypoxia and hypercapnia (HCC) 11/15/2020   Obesity hypoventilation syndrome (HCC) 11/15/2020   History of DVT (deep vein thrombosis) 11/15/2020   Encounter for chronic pain management 09/05/2020   Cellulitis, leg 05/08/2020   Leg DVT (deep venous thromboembolism), acute, bilateral (HCC) 05/08/2020   SOB (shortness of breath)    Gout    Morbid obesity (HCC)    Pneumonia due to COVID-19 virus 12/10/2018   COVID-19 virus infection 12/06/2018   Right hip pain 11/09/2018   Osteomyelitis of ankle or foot, acute, left (HCC) 09/07/2018   Onychomycosis 05/19/2018   Idiopathic chronic venous hypertension of right lower extremity with ulcer and inflammation (HCC) 05/19/2018   Skin  lumps 01/14/2018   Bilateral leg cramps 12/14/2017   Unilateral primary osteoarthritis, right knee 11/18/2017   Asthma, moderate persistent 03/23/2017   Left shoulder pain 08/14/2016   Meralgia paresthetica 07/16/2016   Chronic left-sided low back pain with left-sided sciatica 06/02/2016   Osteoporosis 01/11/2016   CKD (chronic kidney disease) stage 3, GFR 30-59 ml/min (HCC) 01/02/2016   Carpal tunnel syndrome 06/07/2015   Family history of colon cancer    Diabetes mellitus with neurological manifestations (HCC) 04/18/2015   Bilateral leg edema 04/11/2015   Abnormality of gait 01/03/2015   Hereditary and idiopathic peripheral neuropathy 01/03/2015   GERD (gastroesophageal reflux disease) 06/17/2014   Hx of colonic polyps 12/15/2012    Vitamin B12 deficiency 11/03/2012   Intrinsic asthma 03/23/2012   Chronic diastolic (congestive) heart failure (HCC) 02/20/2011   CAD, NATIVE VESSEL 11/20/2008   Osteoarthritis of knees, bilateral 06/14/2008   Hyperlipidemia 05/10/2007   Essential hypertension 01/18/2007   Hypokalemia 04/30/2006   HX, PERSONAL, PEPTIC ULCER DISEASE 04/30/2006    Past Surgical History:  Procedure Laterality Date   ABDOMINAL HYSTERECTOMY     BIOPSY  03/20/2021   Procedure: BIOPSY;  Surgeon: Beverley Fiedler, MD;  Location: [facility];  Service: Gastroenterology;;   BREAST BIOPSY Right 07/08/2018   CARDIAC CATHETERIZATION  2002   non obstructive disease   colonoscopy with polypectomy  2007 & 2012    hyperplastic ;Dr Ewing Schlein   COLONOSCOPY WITH PROPOFOL N/A 06/04/2015   Procedure: COLONOSCOPY WITH PROPOFOL;  Surgeon: Beverley Fiedler, MD;  Location: [facility];  Service: Gastroenterology;  Laterality: N/A;   COLONOSCOPY WITH PROPOFOL N/A 03/20/2021   Procedure: COLONOSCOPY WITH PROPOFOL;  Surgeon: Beverley Fiedler, MD;  Location: [facility];  Service: Gastroenterology;  Laterality: N/A;   DEBRIDEMENT LEG Right 03/02/2018   WOUND VAC APPLIED   DEBRIDEMENT LEG Right 05/27/2018   RIGHT LOWER LEG DEBRIDEMENT,  SKIN GRAFT, VAC PLACEMENT    DILATION AND CURETTAGE OF UTERUS     multiple   ESOPHAGOGASTRODUODENOSCOPY (EGD) WITH PROPOFOL N/A 03/20/2021   Procedure: ESOPHAGOGASTRODUODENOSCOPY (EGD) WITH PROPOFOL;  Surgeon: Beverley Fiedler, MD;  Location: [facility];  Service: Gastroenterology;  Laterality: N/A;   HEMORRHOID SURGERY     HEMORROIDECTOMY     I & D EXTREMITY Right 03/02/2018   Procedure: RIGHT LEG DEBRIDEMENT AND PLACE VAC;  Surgeon: Nadara Mustard, MD;  Location: [facility];  Service: Orthopedics;  Laterality: Right;   I & D EXTREMITY Right 03/04/2018   Procedure: REPEAT IRRIGATION AND DEBRIDEMENT RIGHT LEG, PLACE WOUND VAC;  Surgeon: Nadara Mustard, MD;  Location: [facility];  Service: Orthopedics;  Laterality:  Right;   I & D EXTREMITY Right 03/30/2018   Procedure: IRRIGATION AND DEBRIDEMENT RIGHT LEG, APPLY WOUND VAC;  Surgeon: Nadara Mustard, MD;  Location: [facility];  Service: Orthopedics;  Laterality: Right;   I & D EXTREMITY Right 05/27/2018   Procedure: RIGHT LOWER LEG DEBRIDEMENT,  SKIN GRAFT, VAC PLACEMENT;  Surgeon: Nadara Mustard, MD;  Location: [facility];  Service: Orthopedics;  Laterality: Right;  RIGHT LOWER LEG DEBRIDEMENT,  SKIN GRAFT, VAC PLACEMENT   POLYPECTOMY  03/20/2021   Procedure: POLYPECTOMY;  Surgeon: Beverley Fiedler, MD;  Location: [facility];  Service: Gastroenterology;;   renal calculi  12/1997   SVT with induction of anesthesia   RIGHT HEART CATH N/A 11/19/2020   Procedure: RIGHT HEART CATH;  Surgeon: Dolores Patty, MD;  Location: [facility];  Service: Cardiovascular;  Laterality:  N/A;   right knee arthroscopy     SKIN SPLIT GRAFT Right 03/04/2018   Procedure: POSSIBLE SPLIT THICKNESS SKIN GRAFT;  Surgeon: Nadara Mustard, MD;  Location[facility];  Service: Orthopedics;  Laterality: Right;   SKIN SPLIT GRAFT Right 04/01/2018   Procedure: REPEAT IRRIGATION AND DEBRIDEMENT RIGHT LEG, APPLY SPLIT THICKNESS SKIN GRAFT;  Surgeon: Nadara Mustard, MD;  Location: [facility];  Service: Orthopedics;  Laterality: Right;   TONSILLECTOMY AND ADENOIDECTOMY      OB History   No obstetric history on file.      Home Medications    Prior to Admission medications   Medication Sig Start Date End Date Taking? Authorizing Provider  ciprofloxacin (CIPRO) 500 MG tablet Take 1 tablet (500 mg total) by mouth every 12 (twelve) hours. 04/22/23  Yes Tomi Bamberger, PA-C  acetaminophen (TYLENOL) 650 MG CR tablet Take 1,300 mg by mouth every 8 (eight) hours as needed for pain.    [provider]  Alcohol Swabs (DROPSAFE ALCOHOL PREP) 70 % PADS USE TOPICALLY AS DIRECTED 10/19/22   Pincus Sanes, MD  allopurinol (ZYLOPRIM) 100 MG tablet Take 1 tablet (100 mg total) by mouth 2 (two) times  daily. TAKE 2 TABLETS(200 MG) BY MOUTH DAILY 12/07/22   Pincus Sanes, MD  atorvastatin (LIPITOR) 40 MG tablet Take 1 tablet (40 mg total) by mouth daily. 12/07/22   Pincus Sanes, MD  azelastine (ASTELIN) 0.1 % nasal spray Place 2 sprays into both nostrils 2 (two) times daily. Use in each nostril as directed 11/26/22   Cobb, Ruby Cola, NP  benzonatate (TESSALON) 200 MG capsule TAKE 1 CAPSULE(200 MG) BY MOUTH THREE TIMES DAILY AS NEEDED FOR COUGH 03/02/23   Cobb, Ruby Cola, NP  Blood Glucose Calibration (TRUE METRIX LEVEL 1) Low SOLN UAD  E11.9 10/14/20   Pincus Sanes, MD  Blood Glucose Monitoring Suppl (TRUE METRIX METER) DEVI True Metrix Level 1 solution    [provider]  Budeson-Glycopyrrol-Formoterol (BREZTRI AEROSPHERE) 160-9-4.8 MCG/ACT AERO Inhale 2 puffs into the lungs in the morning and at bedtime. 03/03/23   Cobb, Ruby Cola, NP  Calcium Carb-Cholecalciferol (OYSTER SHELL CALCIUM 250+D) 250-3.125 MG-MCG TABS Take 1 tablet by mouth daily at 2 PM.    [provider]  calcium carbonate (TUMS - DOSED IN MG ELEMENTAL CALCIUM) 500 MG chewable tablet Chew 2 tablets by mouth daily as needed for indigestion or heartburn.    [provider]  cephALEXin (KEFLEX) 500 MG capsule Take 500 mg by mouth 2 (two) times daily. 12/30/22   [provider]  cyanocobalamin (,VITAMIN B-12,) 1000 MCG/ML injection INJECT INTO MUSCLE EVERY 30 DAYS 07/31/20   Pincus Sanes, MD  dapagliflozin propanediol (FARXIGA) 10 MG TABS tablet Take 10 mg by mouth daily.    [provider]  diclofenac Sodium (VOLTAREN) 1 % GEL Apply 1 application topically as needed (pain).    [provider]  diphenhydrAMINE (BENADRYL) 12.5 MG/5ML liquid Take 25 mg by mouth 4 (four) times daily as needed for allergies.    [provider]  ELIQUIS 2.5 MG TABS tablet TAKE 1 TABLET BY MOUTH TWICE DAILY 11/17/22   Pincus Sanes, MD  famotidine (PEPCID) 40 MG tablet Take 1 tablet  (40 mg total) by mouth 2 (two) times daily. 01/08/23   Pincus Sanes, MD  FEROSUL 325 (65 Fe) MG tablet TAKE 1 TABLET BY MOUTH ON MONDAYS, WEDNESDAY, AND FRIDAY 03/01/23   Cheryll Cockayne  J, MD  fluticasone (FLONASE) 50 MCG/ACT nasal spray Place 2 sprays into both nostrils daily. 04/04/21   Cobb, Ruby Cola, NP  gabapentin (NEURONTIN) 100 MG capsule TAKE 1 CAPSULE(100 MG) BY MOUTH THREE TIMES DAILY 11/02/22   Pincus Sanes, MD  hydrOXYzine (ATARAX) 25 MG tablet Take 1 tablet (25 mg total) by mouth 3 (three) times daily as needed for itching. 01/29/22   Burns, Bobette Mo, MD  mometasone (ELOCON) 0.1 % cream Apply topically 2 (two) times daily. 01/04/23   [provider]  montelukast (SINGULAIR) 10 MG tablet TAKE 1 TABLET(10 MG) BY MOUTH AT BEDTIME 12/28/22   Burns, Bobette Mo, MD  nebivolol (BYSTOLIC) 5 MG tablet TAKE 1 TABLET(5 MG) BY MOUTH DAILY 12/15/22   Pincus Sanes, MD  Oxcarbazepine (TRILEPTAL) 300 MG tablet TAKE 1 TABLET(300 MG) BY MOUTH TWICE DAILY 01/25/23   Etta Grandchild, MD  polyethylene glycol (MIRALAX / GLYCOLAX) 17 g packet Take 17 g by mouth daily as needed for moderate constipation.    [provider]  potassium chloride SA (KLOR-CON M) 20 MEQ tablet TAKE 1 TABLET BY MOUTH DAILY AND 3 TABLETS BY MOUTH EVERY [facility] 10/13/22   Bensimhon, Bevelyn Buckles, MD  Spacer/Aero-Holding Rudean Curt Use with inhaler. 04/04/21   Cobb, Ruby Cola, NP  spironolactone (ALDACTONE) 25 MG tablet TAKE 1 TABLET(25 MG) BY MOUTH DAILY 11/02/22   Burns, Bobette Mo, MD  tirzepatide [facility]) 15 MG/0.5ML Pen INJECT 15MG  (1 PEN) UNDER THE SKIN EVERY WEEK 02/08/23   Pincus Sanes, MD  torsemide (DEMADEX) 20 MG tablet TAKE 2 TABLETS(40 MG) BY MOUTH TWICE DAILY 11/17/22   Pincus Sanes, MD  triamcinolone ointment (KENALOG) 0.5 % Apply 1 Application topically as needed (knee pain).    [provider]  UNABLE TO FIND Med Name: Oxygen portable at home 2-liters as needed    [provider]   VENTOLIN HFA 108 (90 Base) MCG/ACT inhaler Inhale 2 puffs into the lungs every 4 (four) hours as needed for wheezing. 11/13/19   [provider]    Family History Family History  Problem Relation Age of Onset   Colon cancer Mother    Prostate cancer Father    Colon cancer Father    Diabetes Maternal Aunt    Breast cancer Maternal Aunt    Diabetes Maternal Uncle    Diabetes Paternal Aunt    Stroke Paternal Aunt        > 65   Heart disease Paternal Aunt    Diabetes Paternal Uncle    Breast cancer Maternal Aunt         X 2   Breast cancer Cousin     Social History Social History   Tobacco Use   Smoking status: Never    Passive exposure: Never   Smokeless tobacco: Never  Vaping Use   Vaping status: Never Used  Substance Use Topics   Alcohol use: No    Alcohol/week: 0.0 standard drinks of alcohol   Drug use: No     Allergies   Penicillins, Shellfish allergy, Sulfa antibiotics, Sulfonamide derivatives, Hydrochlorothiazide w-triamterene, Lotensin [benazepril hcl], Dipyridamole, Estrogens, Hydrochlorothiazide, Latex, Metronidazole, Other, Spironolactone, Torsemide, Valsartan, Doxycycline, and Mustard [allyl isothiocyanate]   Review of Systems Review of Systems  Constitutional:  Negative for chills and fever.  Eyes:  Negative for discharge and redness.  Respiratory:  Negative for shortness of breath.   Gastrointestinal:  Negative for nausea and vomiting.  Genitourinary:  Positive for dysuria.  Physical Exam Triage Vital Signs ED Triage Vitals  Encounter Vitals Group     BP --      Systolic BP Percentile --      Diastolic BP Percentile --      Pulse --      Resp --      Temp --      Temp src --      SpO2 --      Weight 04/22/23 1544 (!) 316 lb (143.3 kg)     Height 04/22/23 1544 5\' 4"  (1.626 m)     Head Circumference --      Peak Flow --      Pain Score 04/22/23 1540 10     Pain Loc --      Pain Education --      Exclude from Growth Chart --     No data found.  Updated Vital Signs BP 124/73 (BP Location: Right Arm)   Pulse 98   Temp 98.8 F (37.1 C) (Oral)   Resp (!) 22   Ht 5\' 4"  (1.626 m)   Wt (!) 316 lb (143.3 kg)   SpO2 96%   BMI 54.24 kg/m   Visual Acuity Right Eye Distance:   Left Eye Distance:   Bilateral Distance:    Right Eye Near:   Left Eye Near:    Bilateral Near:     Physical Exam Vitals and nursing note reviewed.  Constitutional:      General: She is not in acute distress.    Appearance: She is not ill-appearing.     Comments: Appears uncomfortable  HENT:     Head: Normocephalic and atraumatic.  Eyes:     Conjunctiva/sclera: Conjunctivae normal.  Cardiovascular:     Rate and Rhythm: Normal rate.  Pulmonary:     Effort: Pulmonary effort is normal. No respiratory distress.  Neurological:     Mental Status: She is alert.  Psychiatric:        Mood and Affect: Mood normal.        Behavior: Behavior normal.        Thought Content: Thought content normal.      UC Treatments / Results  Labs (all labs ordered are listed, but only abnormal results are displayed) Labs Reviewed  POCT URINALYSIS DIP (MANUAL ENTRY) - Abnormal; Notable for the following components:      Result Value   Color, UA light yellow (*)    Clarity, UA cloudy (*)    Leukocytes, UA Moderate (2+) (*)    All other components within normal limits    EKG   Radiology No results found.  Procedures Procedures (including critical care time)  Medications Ordered in UC Medications - No data to display  Initial Impression / Assessment and Plan / UC Course  I have reviewed the triage vital signs and the nursing notes.  Pertinent labs & imaging results that were available during my care of the patient were reviewed by me and considered in my medical decision making (see chart for details).    Will treat to cover UTI with Cipro.  Patient did not have enough sample for urine culture so advised follow-up should symptoms  not improve and evaluation in the emergency room with any worsening.  Patient expresses understanding.  Final Clinical Impressions(s) / UC Diagnoses   Final diagnoses:  Acute cystitis without hematuria   Discharge Instructions   None    ED Prescriptions     Medication Sig Dispense Auth. Provider  ciprofloxacin (CIPRO) 500 MG tablet Take 1 tablet (500 mg total) by mouth every 12 (twelve) hours. 10 tablet Tomi Bamberger, PA-C      PDMP not reviewed this encounter.   Tomi Bamberger, PA-C 04/22/23 1626

## 2023-04-22 NOTE — Telephone Encounter (Signed)
 Chief Complaint: Dysuria Symptoms: Urinary frequency, loss of appetite Frequency: Ongoing since last night  Pertinent Negatives: Patient denies pain in urine, fever, chills, dizziness  Disposition: /[x] Urgent Care (no appt availability in office)   Additional Notes: Pt states she did not urinate for about 6 hours yesterday. Pt states last night she started having shooting pains and it was difficult to urinate. Pt states she took x2 Tylenol. Pt states she was able to sleep but this morning she is having the shooting pain again and just having droplets of urine when peeing. Pt states the pain is 10/10. Pt advised to go to urgent care due to no appt availability in office. This RN educated pt on home care, new-worsening symptoms, when to call back/seek emergent care. Pt verbalized understanding and agrees to plan.     Copied from CRM [id number]. Topic: Clinical - Red Word Triage >> Apr 22, 2023  1:10 PM Chantha C wrote: [facility] that prompted transfer to Nurse Triage: Patient started last night, very painful and burning sensation when urinating, urgent need to urinate, but little urine is passed, vaginal pain, unsure of fever. Patient denies dizziness, nausea, vomiting, chills, bloody or cloudy urine. Patient wants medication sent to the phamacy. Please advise [phone].   [id number] - [place], [place] - [address] AT [address] [place] [address] Phone: [phone] Fax: [phone] Reason for Disposition  [1] SEVERE pain with urination (e.g., excruciating) AND [2] not improved after 2 hours of pain medicine and Sitz bath  Answer Assessment - Initial Assessment Questions 1. SYMPTOM: "What's the main symptom you're concerned about?" (e.g., frequency, incontinence)     Pain with urination 2. ONSET: "When did this  start?"     Lastly 3. PAIN: "Is there any pain?" If Yes, ask: "How bad is it?" (Scale: 1-10; mild, moderate, severe)     10 when it  happens 5. OTHER SYMPTOMS: "Do you have any other symptoms?" (e.g., blood in urine, fever, flank pain, pain with urination)     Denies  Answer Assessment - Initial Assessment Questions Chief Complaint: Dysuria Symptoms: Urinary frequency, loss of appetite Frequency: Ongoing since last night  Pertinent Negatives: Patient denies pain in urine, fever, chills, dizziness  Protocols used: Urinary Symptoms-A-AH, Urination Pain - Female-A-AH

## 2023-04-22 NOTE — ED Triage Notes (Signed)
"  I have shooting pains in my vaginal area, I am having a lot of painful urination, a lot of pain in my bladder area, no blood seen in urine". No nausea or vomiting. No fever.

## 2023-04-26 ENCOUNTER — Other Ambulatory Visit: Payer: Self-pay | Admitting: Internal Medicine

## 2023-04-26 ENCOUNTER — Ambulatory Visit: Payer: Self-pay

## 2023-04-26 ENCOUNTER — Telehealth: Payer: Self-pay | Admitting: Internal Medicine

## 2023-04-26 NOTE — Telephone Encounter (Signed)
  Chief Complaint: burning with urination Symptoms: burning with urination, pain Frequency: Began 04/22/23 Pertinent Negatives: Patient denies fever, NVD Disposition: [] ED /[] Urgent Care (no appt availability in office) / [x] Appointment(In office/virtual)/ []  [facility]/ [] Home Care/ [x] Refused Recommended Disposition /[] [facility]/ []  Follow-up with PCP Additional Notes: Patient calls reporting UTI symptoms with no improvement, reporting burning with urination and two doses of antibiotics left. Per protocol, patient to be evaluated within 4 hours.Patient declined visit today stating husband is working and she has no way to be seen, requests appt for tomorrow. Patient scheduled for 04/27/23 at 1040. This RN advised patient she could be seen in urgent care this evening, patient states she does not want to be seen in UC. Care advice reviewed, patient verbalized understanding and denies further questions at this time. Patient is requesting PCP call in alternate antibiotic, advised patient she will likely need to be evaluated due to symptoms. Alerting PCP for review and follow up.    Reason for Disposition  [1] Taking antibiotic > 24 hours for UTI AND [2] flank or lower back pain getting WORSE  Answer Assessment - Initial Assessment Questions 1. MAIN SYMPTOM: "What is the main symptom you are concerned about?" (e.g., painful urination, urine frequency)     Burning with urination 2. BETTER-SAME-WORSE: "Are you getting better, staying the same, or getting worse compared to how you felt at your last visit to the doctor (most recent medical visit)?"     Staying the same- not as painful as it was on the 20th, but still painful 3. PAIN: "How bad is the pain?"  (e.g., Scale 1-10; mild, moderate, or severe)   - MILD (1-3): complains slightly about urination hurting   - MODERATE (4-7): interferes with normal activities     - SEVERE (8-10): excruciating, unwilling or unable to urinate  because of the pain      5/10 4. FEVER: "Do you have a fever?" If Yes, ask: "What is it, how was it measured, and when did it start?"     Denies at this time but felt cold over the weekend. 5. OTHER SYMPTOMS: "Do you have any other symptoms?" (e.g., blood in the urine, flank pain, vaginal discharge)     Denies 6. DIAGNOSIS: "When was the UTI diagnosed?" "By whom?" "Was it a kidney infection, bladder infection or both?"     04/22/23, seen in UC, UTI/bladder infection 7. ANTIBIOTIC: "What antibiotic(s) are you taking?" "How many times per day?"     Ciprofloxican 500 mg BID, states she has 2 doses left. 8. ANTIBIOTIC - START DATE: "When did you start taking the antibiotic?"     04/22/23  Protocols used: Urinary Tract Infection on Antibiotic Follow-up Call - [facility]

## 2023-04-26 NOTE — Telephone Encounter (Signed)
 Copied from CRM [id number]. Topic: General - Other >> Apr 26, 2023  8:56 AM Adele Barthel wrote: Reason for CRM:   Patient is no longer using MyChart, due to issues navigating the program. She would like to know if an email can be sent to her providers, advising she is no longer using MyChart so that she will be contacted by phone instead.   CB# ]  [phone]

## 2023-04-26 NOTE — Progress Notes (Unsigned)
 Subjective:    Patient ID: April [name], female    DOB: [birth year], 73 y.o.   MRN: [id number]      HPI Evamae is here for No chief complaint on file.  UTI  - went to urgent care 3/20 - UA showed moderate leukocytes.  Started on cipro 500 mg bid x 5 days.  No culture was sent.    B12 def -    Medications and allergies reviewed with patient and updated if appropriate.  Current Outpatient Medications on File Prior to Visit  Medication Sig Dispense Refill   acetaminophen (TYLENOL) 650 MG CR tablet Take 1,300 mg by mouth every 8 (eight) hours as needed for pain.     Alcohol Swabs (DROPSAFE ALCOHOL PREP) 70 % PADS USE TOPICALLY AS DIRECTED 300 each 3   allopurinol (ZYLOPRIM) 100 MG tablet Take 1 tablet (100 mg total) by mouth 2 (two) times daily. TAKE 2 TABLETS(200 MG) BY MOUTH DAILY 180 tablet 1   atorvastatin (LIPITOR) 40 MG tablet Take 1 tablet (40 mg total) by mouth daily. 90 tablet 2   azelastine (ASTELIN) 0.1 % nasal spray Place 2 sprays into both nostrils 2 (two) times daily. Use in each nostril as directed 30 mL 5   benzonatate (TESSALON) 200 MG capsule TAKE 1 CAPSULE(200 MG) BY MOUTH THREE TIMES DAILY AS NEEDED FOR COUGH 60 capsule 1   Blood Glucose Calibration (TRUE METRIX LEVEL 1) Low SOLN UAD  E11.9 1 each 3   Blood Glucose Monitoring Suppl (TRUE METRIX METER) DEVI True Metrix Level 1 solution     Budeson-Glycopyrrol-Formoterol (BREZTRI AEROSPHERE) 160-9-4.8 MCG/ACT AERO Inhale 2 puffs into the lungs in the morning and at bedtime. 10.7 g 5   Calcium Carb-Cholecalciferol (OYSTER SHELL CALCIUM 250+D) 250-3.125 MG-MCG TABS Take 1 tablet by mouth daily at 2 PM.     calcium carbonate (TUMS - DOSED IN MG ELEMENTAL CALCIUM) 500 MG chewable tablet Chew 2 tablets by mouth daily as needed for indigestion or heartburn.     cephALEXin (KEFLEX) 500 MG capsule Take 500 mg by mouth 2 (two) times daily.     ciprofloxacin (CIPRO) 500 MG tablet Take 1 tablet (500 mg total) by mouth every  12 (twelve) hours. 10 tablet 0   cyanocobalamin (,VITAMIN B-12,) 1000 MCG/ML injection INJECT INTO MUSCLE EVERY 30 DAYS 10 mL 5   dapagliflozin propanediol (FARXIGA) 10 MG TABS tablet Take 10 mg by mouth daily.     diclofenac Sodium (VOLTAREN) 1 % GEL Apply 1 application topically as needed (pain).     diphenhydrAMINE (BENADRYL) 12.5 MG/5ML liquid Take 25 mg by mouth 4 (four) times daily as needed for allergies.     ELIQUIS 2.5 MG TABS tablet TAKE 1 TABLET BY MOUTH TWICE DAILY 180 tablet 1   famotidine (PEPCID) 40 MG tablet Take 1 tablet (40 mg total) by mouth 2 (two) times daily. 180 tablet 3   FEROSUL 325 (65 Fe) MG tablet TAKE 1 TABLET BY MOUTH ON MONDAYS, WEDNESDAY, AND FRIDAY 60 tablet 0   fluticasone (FLONASE) 50 MCG/ACT nasal spray Place 2 sprays into both nostrils daily. 18.2 mL 2   gabapentin (NEURONTIN) 100 MG capsule TAKE 1 CAPSULE(100 MG) BY MOUTH THREE TIMES DAILY 90 capsule 1   hydrOXYzine (ATARAX) 25 MG tablet Take 1 tablet (25 mg total) by mouth 3 (three) times daily as needed for itching. 90 tablet 1   mometasone (ELOCON) 0.1 % cream Apply topically 2 (two) times daily.  montelukast (SINGULAIR) 10 MG tablet TAKE 1 TABLET(10 MG) BY MOUTH AT BEDTIME 30 tablet 5   nebivolol (BYSTOLIC) 5 MG tablet TAKE 1 TABLET(5 MG) BY MOUTH DAILY 90 tablet 1   Oxcarbazepine (TRILEPTAL) 300 MG tablet TAKE 1 TABLET(300 MG) BY MOUTH TWICE DAILY 180 tablet 0   polyethylene glycol (MIRALAX / GLYCOLAX) 17 g packet Take 17 g by mouth daily as needed for moderate constipation.     potassium chloride SA (KLOR-CON M) 20 MEQ tablet TAKE 1 TABLET BY MOUTH DAILY AND 3 TABLETS BY MOUTH EVERY WEDNESDAY 102 tablet 3   Spacer/Aero-Holding [facility] Use with inhaler. 1 each 2   spironolactone (ALDACTONE) 25 MG tablet TAKE 1 TABLET(25 MG) BY MOUTH DAILY 90 tablet 1   tirzepatide (MOUNJARO) 15 MG/0.5ML Pen INJECT 15MG  (1 PEN) UNDER THE SKIN EVERY WEEK 6 mL 3   torsemide (DEMADEX) 20 MG tablet TAKE 2  TABLETS(40 MG) BY MOUTH TWICE DAILY 360 tablet 3   triamcinolone ointment (KENALOG) 0.5 % Apply 1 Application topically as needed (knee pain).     UNABLE TO FIND Med Name: Oxygen portable at home 2-liters as needed     VENTOLIN HFA 108 (90 Base) MCG/ACT inhaler Inhale 2 puffs into the lungs every 4 (four) hours as needed for wheezing.     No current facility-administered medications on file prior to visit.    Review of Systems     Objective:  There were no vitals filed for this visit. BP Readings from Last 3 Encounters:  04/22/23 124/73  04/13/23 128/78  01/08/23 132/76   Wt Readings from Last 3 Encounters:  04/22/23 (!) 316 lb (143.3 kg)  12/04/22 (!) 322 lb (146.1 kg)  11/26/22 (!) 321 lb (145.6 kg)   There is no height or weight on file to calculate BMI.    Physical Exam         Assessment & Plan:    See Problem List for Assessment and Plan of chronic medical problems.

## 2023-04-27 ENCOUNTER — Encounter: Payer: Self-pay | Admitting: Internal Medicine

## 2023-04-27 ENCOUNTER — Ambulatory Visit: Admitting: Internal Medicine

## 2023-04-27 VITALS — BP 138/78 | HR 94 | Temp 98.3°F | Ht 64.0 in | Wt 316.0 lb

## 2023-04-27 DIAGNOSIS — E538 Deficiency of other specified B group vitamins: Secondary | ICD-10-CM | POA: Diagnosis not present

## 2023-04-27 DIAGNOSIS — N3000 Acute cystitis without hematuria: Secondary | ICD-10-CM | POA: Insufficient documentation

## 2023-04-27 DIAGNOSIS — R350 Frequency of micturition: Secondary | ICD-10-CM

## 2023-04-27 LAB — URINALYSIS, ROUTINE W REFLEX MICROSCOPIC
Bilirubin Urine: NEGATIVE
Hgb urine dipstick: NEGATIVE
Ketones, ur: NEGATIVE
Nitrite: NEGATIVE
RBC / HPF: NONE SEEN (ref 0–?)
Specific Gravity, Urine: 1.02 (ref 1.000–1.030)
Total Protein, Urine: NEGATIVE
Urine Glucose: >=1000 — AB
Urobilinogen, UA: 0.2 (ref 0.0–1.0)
pH: 6 (ref 5.0–8.0)

## 2023-04-27 MED ORDER — CEPHALEXIN 500 MG PO CAPS: 500.0000 mg | ORAL_CAPSULE | Freq: Two times a day (BID) | ORAL | 0 refills | Status: DC

## 2023-04-27 MED ORDER — CYANOCOBALAMIN 1000 MCG/ML IJ SOLN
1000.0000 ug | Freq: Once | INTRAMUSCULAR | Status: AC
  Administered 2023-04-27: 1000 ug via INTRAMUSCULAR

## 2023-04-27 NOTE — Patient Instructions (Addendum)
    B12 injection given    Urine tests were ordered.       Medications changes include :   keflex twice daily for 1 week      Return if symptoms worsen or fail to improve.

## 2023-04-27 NOTE — Assessment & Plan Note (Signed)
 Chronic Continue monthly B12 injections B12 injection given today

## 2023-04-27 NOTE — Assessment & Plan Note (Signed)
 Acute Went to urgent care is 3/20 with UTI symptoms-UA showed moderate leukocytosis, but unfortunately culture was not done Prescribed Cipro 500 mg twice daily x 5 days-symptoms are not better Not able to give a urine sample, but will give 1 in the lab before she leaves Start cephalexin 500 mg twice daily x 7 days Advised that her symptoms may not be consistent with a UTI and may need further evaluation

## 2023-04-28 ENCOUNTER — Ambulatory Visit: Payer: Medicare PPO | Admitting: Nurse Practitioner

## 2023-04-28 LAB — URINE CULTURE

## 2023-04-30 ENCOUNTER — Ambulatory Visit: Payer: Medicare PPO

## 2023-05-03 ENCOUNTER — Other Ambulatory Visit: Payer: Self-pay | Admitting: Nurse Practitioner

## 2023-05-03 ENCOUNTER — Other Ambulatory Visit: Payer: Self-pay | Admitting: Internal Medicine

## 2023-05-03 DIAGNOSIS — J4541 Moderate persistent asthma with (acute) exacerbation: Secondary | ICD-10-CM

## 2023-05-03 MED ORDER — GABAPENTIN 100 MG PO CAPS: ORAL_CAPSULE | ORAL | 1 refills | Status: DC

## 2023-05-03 NOTE — Telephone Encounter (Unsigned)
 Copied from CRM [id number]. Topic: Clinical - Medication Refill >> May 03, 2023  9:10 AM Isabell A wrote: Most Recent Primary Care Visit:  Provider: BURNS, Bobette Mo  Department: [facility]  Visit Type: ACUTE  Date: 04/27/2023  Medication: benzonatate (TESSALON) 200 MG capsule  Has the patient contacted their pharmacy? No (Agent: If no, request that the patient contact the pharmacy for the refill. If patient does not wish to contact the pharmacy document the reason why and proceed with request.) (Agent: If yes, when and what did the pharmacy advise?)  Is this the correct pharmacy for this prescription? Yes If no, delete pharmacy and type the correct one.  This is the patient's preferred pharmacy:  [facility] [address], [place] - [address] [place] [address] Phone: [phone] Fax: [phone]  Has the prescription been filled recently? Yes  Is the patient out of the medication? Yes  Has the patient been seen for an appointment in the last year OR does the patient have an upcoming appointment? Yes  Can we respond through MyChart? No  Agent: Please be advised that Rx refills may take up to 3 business days. We ask that you follow-up with your pharmacy.

## 2023-05-03 NOTE — Telephone Encounter (Unsigned)
 Copied from CRM [id number]. Topic: Clinical - Medication Refill >> May 03, 2023  8:17 AM Almira Coaster wrote: Most Recent Primary Care Visit:  Provider: BURNS, Bobette Mo  Department: [facility]  Visit Type: ACUTE  Date: 04/27/2023  Medication: gabapentin (NEURONTIN) 100 MG capsule  Has the patient contacted their pharmacy? No (Agent: If no, request that the patient contact the pharmacy for the refill. If patient does not wish to contact the pharmacy document the reason why and proceed with request.) (Agent: If yes, when and what did the pharmacy advise?)  Is this the correct pharmacy for this prescription? Yes If no, delete pharmacy and type the correct one.  This is the patient's preferred pharmacy:  [facility] [address] [place] - [address] [place] [address] Phone: [phone] Fax: [phone]    Has the prescription been filled recently? No  Is the patient out of the medication? No  Has the patient been seen for an appointment in the last year OR does the patient have an upcoming appointment? Yes  Can we respond through MyChart? No  Agent: Please be advised that Rx refills may take up to 3 business days. We ask that you follow-up with your pharmacy.

## 2023-05-11 ENCOUNTER — Other Ambulatory Visit: Payer: Self-pay | Admitting: Internal Medicine

## 2023-05-14 DIAGNOSIS — N764 Abscess of vulva: Secondary | ICD-10-CM | POA: Diagnosis not present

## 2023-05-14 DIAGNOSIS — E538 Deficiency of other specified B group vitamins: Secondary | ICD-10-CM | POA: Diagnosis not present

## 2023-05-14 DIAGNOSIS — D631 Anemia in chronic kidney disease: Secondary | ICD-10-CM | POA: Diagnosis not present

## 2023-05-14 DIAGNOSIS — N184 Chronic kidney disease, stage 4 (severe): Secondary | ICD-10-CM | POA: Diagnosis not present

## 2023-05-14 DIAGNOSIS — I5032 Chronic diastolic (congestive) heart failure: Secondary | ICD-10-CM | POA: Diagnosis not present

## 2023-05-14 DIAGNOSIS — G5600 Carpal tunnel syndrome, unspecified upper limb: Secondary | ICD-10-CM | POA: Diagnosis not present

## 2023-05-14 DIAGNOSIS — N9089 Other specified noninflammatory disorders of vulva and perineum: Secondary | ICD-10-CM | POA: Diagnosis not present

## 2023-05-14 DIAGNOSIS — N2581 Secondary hyperparathyroidism of renal origin: Secondary | ICD-10-CM | POA: Diagnosis not present

## 2023-05-14 DIAGNOSIS — B372 Candidiasis of skin and nail: Secondary | ICD-10-CM | POA: Diagnosis not present

## 2023-05-14 DIAGNOSIS — E1122 Type 2 diabetes mellitus with diabetic chronic kidney disease: Secondary | ICD-10-CM | POA: Diagnosis not present

## 2023-05-14 DIAGNOSIS — R300 Dysuria: Secondary | ICD-10-CM | POA: Diagnosis not present

## 2023-05-15 LAB — LAB REPORT - SCANNED
Albumin, Urine POC: 15.3
Creatinine, POC: 83.3 mg/dL
EGFR: 27
Microalb Creat Ratio: 18

## 2023-05-17 DIAGNOSIS — J9601 Acute respiratory failure with hypoxia: Secondary | ICD-10-CM | POA: Diagnosis not present

## 2023-05-17 DIAGNOSIS — I5033 Acute on chronic diastolic (congestive) heart failure: Secondary | ICD-10-CM | POA: Diagnosis not present

## 2023-05-17 DIAGNOSIS — J9602 Acute respiratory failure with hypercapnia: Secondary | ICD-10-CM | POA: Diagnosis not present

## 2023-05-18 ENCOUNTER — Ambulatory Visit (INDEPENDENT_AMBULATORY_CARE_PROVIDER_SITE_OTHER)

## 2023-05-18 DIAGNOSIS — E1142 Type 2 diabetes mellitus with diabetic polyneuropathy: Secondary | ICD-10-CM

## 2023-05-18 DIAGNOSIS — M2141 Flat foot [pes planus] (acquired), right foot: Secondary | ICD-10-CM

## 2023-05-18 DIAGNOSIS — L84 Corns and callosities: Secondary | ICD-10-CM

## 2023-05-18 DIAGNOSIS — M2142 Flat foot [pes planus] (acquired), left foot: Secondary | ICD-10-CM

## 2023-05-18 NOTE — Progress Notes (Signed)

## 2023-05-21 ENCOUNTER — Ambulatory Visit: Payer: Self-pay | Admitting: Internal Medicine

## 2023-05-21 NOTE — Telephone Encounter (Signed)
 This pt called in stating she was returning a call. This RN looked through pt chart but did not see an encounter. This RN confirmed pt's appt at [facility] for next Fri, 4/25, for a B12 injection. This RN will send a message to clinic incase there is a reason she was called. This RN confirmed pt call back number is [phone].  Reason for Disposition  Health Information question, no triage required and triager able to answer question  Answer Assessment - Initial Assessment Questions 1. REASON FOR CALL or QUESTION: "What is your reason for calling today?" or "How can I best help you?" or "What question do you have that I can help answer?"     RETURN CALL  Protocols used: Information Only Call - No Triage-A-AH

## 2023-05-25 ENCOUNTER — Other Ambulatory Visit: Payer: Self-pay | Admitting: Nurse Practitioner

## 2023-05-28 ENCOUNTER — Ambulatory Visit

## 2023-05-28 ENCOUNTER — Ambulatory Visit: Payer: Medicare PPO | Admitting: Podiatry

## 2023-05-28 DIAGNOSIS — E538 Deficiency of other specified B group vitamins: Secondary | ICD-10-CM | POA: Diagnosis not present

## 2023-05-28 MED ORDER — CYANOCOBALAMIN 1000 MCG/ML IJ SOLN
1000.0000 ug | Freq: Once | INTRAMUSCULAR | Status: AC
Start: 2023-05-28 — End: 2023-05-28
  Administered 2023-05-28: 1000 ug via INTRAMUSCULAR

## 2023-05-28 NOTE — Progress Notes (Signed)
 Patient visits today for their b-12 injection. Patient informed of what they had received and tolerated injection well. Patient notified to reach out to office if needed.

## 2023-05-31 ENCOUNTER — Ambulatory Visit: Admitting: Podiatry

## 2023-05-31 DIAGNOSIS — B351 Tinea unguium: Secondary | ICD-10-CM | POA: Diagnosis not present

## 2023-05-31 DIAGNOSIS — Z7901 Long term (current) use of anticoagulants: Secondary | ICD-10-CM

## 2023-05-31 DIAGNOSIS — M79675 Pain in left toe(s): Secondary | ICD-10-CM | POA: Diagnosis not present

## 2023-05-31 DIAGNOSIS — M79674 Pain in right toe(s): Secondary | ICD-10-CM

## 2023-05-31 NOTE — Progress Notes (Signed)
 Subjective: Chief Complaint  Patient presents with   [facility]    RM#12 [facility]    73 y.o. returns the office today for painful, elongated, thickened toenails which she cannot trim herself. Denies any redness or drainage around the nails.  No new pedal complaints today.  On Eliquis    PCP: Colene Dauphin, MD Last Seen: 01/08/2023  A1c: 5.7 on 12/6/024  Objective: AAO 3, NAD- in wheelchair DP/PT pulses decreased- feet are warm and perfused.  Chronic lower extremity edema present.  Sensation decreased bilaterally.  Nails hypertrophic, dystrophic, elongated, brittle, discolored 10. There is tenderness overlying the nails 1-5 bilaterally. There is no surrounding erythema or drainage along the nail sites. No open lesions noted No pain with calf compression, swelling, warmth, erythema.  Assessment: Patient presents with symptomatic onychomycosis  Plan: -Treatment options including alternatives, risks, complications were discussed -Nails sharply debrided 10 without complication/bleeding. -Discussed daily foot inspection. If there are any changes, to call the office immediately.   Return in about 3 months (around 08/30/2023).  Bobbie Burows, DPM

## 2023-06-07 ENCOUNTER — Telehealth: Payer: Self-pay | Admitting: Internal Medicine

## 2023-06-07 ENCOUNTER — Other Ambulatory Visit: Payer: Self-pay | Admitting: Internal Medicine

## 2023-06-07 MED ORDER — NEBIVOLOL HCL 5 MG PO TABS: ORAL_TABLET | ORAL | 1 refills | Status: DC

## 2023-06-07 NOTE — Telephone Encounter (Signed)
 Copied from CRM [id number]. Topic: Clinical - Medication Refill >> Jun 07, 2023 11:13 AM Keitha Pata L wrote: Most Recent Primary Care Visit:  Provider: STUBBS-BARNETTE, ZACKARY W  Department: [facility]  Visit Type: CLINICAL SUPPORT  Date: 05/28/2023  Medication: nebivolol  (BYSTOLIC ) 5 MG tablet  Has the patient contacted their pharmacy? No (Agent: If no, request that the patient contact the pharmacy for the refill. If patient does not wish to contact the pharmacy document the reason why and proceed with request.) (Agent: If yes, when and what did the pharmacy advise?)  Is this the correct pharmacy for this prescription? Yes If no, delete pharmacy and type the correct one.  This is the patient's preferred pharmacy:  [facility] [address] [place] - [address] [place] [address] Phone: [phone] Fax: [phone]    Has the prescription been filled recently? No  Is the patient out of the medication? Yes  Has the patient been seen for an appointment in the last year OR does the patient have an upcoming appointment? Yes  Can we respond through MyChart? Yes  Agent: Please be advised that Rx refills may take up to 3 business days. We ask that you follow-up with your pharmacy.

## 2023-06-07 NOTE — Telephone Encounter (Signed)
 Copied from CRM [id number]. Topic: Clinical - Prescription Issue >> Jun 07, 2023 10:45 AM Chuck Crater wrote: Reason for CRM: [employer] says that she needs a Pre Authorization first for that mounjaro  15 mg 3 month supply.

## 2023-06-07 NOTE — Telephone Encounter (Signed)
 Rx sent.

## 2023-06-08 ENCOUNTER — Telehealth: Payer: Self-pay

## 2023-06-08 ENCOUNTER — Other Ambulatory Visit (HOSPITAL_COMMUNITY): Payer: Self-pay

## 2023-06-08 NOTE — Telephone Encounter (Signed)
 Pharmacy Patient Advocate Encounter  Received notification from [employer] that Prior Authorization for Mounjaro15mg /0.58ml has been CANCELLED due to the pharmacy has received a paid claim for the submitted date of service. Please review and if needed, resubmit after 7 days.

## 2023-06-08 NOTE — Telephone Encounter (Signed)
 Pharmacy Patient Advocate Encounter   Received notification from Pt Calls Messages that prior authorization for Mounjaro  15mg /0.71ml is required/requested.   Insurance verification completed.   The patient is insured through [employer] .   Per test claim: PA required; PA submitted to above mentioned insurance via CoverMyMeds Key/confirmation #/EOC [id number] Status is pending

## 2023-06-10 DIAGNOSIS — N760 Acute vaginitis: Secondary | ICD-10-CM | POA: Diagnosis not present

## 2023-06-10 DIAGNOSIS — N898 Other specified noninflammatory disorders of vagina: Secondary | ICD-10-CM | POA: Diagnosis not present

## 2023-06-10 DIAGNOSIS — B3731 Acute candidiasis of vulva and vagina: Secondary | ICD-10-CM | POA: Diagnosis not present

## 2023-06-10 DIAGNOSIS — B9689 Other specified bacterial agents as the cause of diseases classified elsewhere: Secondary | ICD-10-CM | POA: Diagnosis not present

## 2023-06-16 ENCOUNTER — Ambulatory Visit (INDEPENDENT_AMBULATORY_CARE_PROVIDER_SITE_OTHER)

## 2023-06-16 VITALS — Ht 64.0 in | Wt 316.0 lb

## 2023-06-16 DIAGNOSIS — J9602 Acute respiratory failure with hypercapnia: Secondary | ICD-10-CM | POA: Diagnosis not present

## 2023-06-16 DIAGNOSIS — Z0000 Encounter for general adult medical examination without abnormal findings: Secondary | ICD-10-CM

## 2023-06-16 DIAGNOSIS — I5033 Acute on chronic diastolic (congestive) heart failure: Secondary | ICD-10-CM | POA: Diagnosis not present

## 2023-06-16 DIAGNOSIS — J9601 Acute respiratory failure with hypoxia: Secondary | ICD-10-CM | POA: Diagnosis not present

## 2023-06-16 NOTE — Progress Notes (Signed)
 Subjective:   April [name] is a 73 y.o. who presents for a Medicare Wellness preventive visit.  As a reminder, Annual Wellness Visits don't include a physical exam, and some assessments may be limited, especially if this visit is performed virtually. We may recommend an in-person visit if needed.  Visit Complete: Virtual I connected with  April [name] on 06/16/23 by a audio enabled telemedicine application and verified that I am speaking with the correct person using two identifiers.  Patient Location: Home  Provider Location: Home Office  I discussed the limitations of evaluation and management by telemedicine. The patient expressed understanding and agreed to proceed.  Vital Signs: Because this visit was a virtual/telehealth visit, some criteria may be missing or patient reported. Any vitals not documented were not able to be obtained and vitals that have been documented are patient reported.  VideoDeclined- This patient declined Librarian, academic. Therefore the visit was completed with audio only.  Persons Participating in Visit: Patient.  AWV Questionnaire: No: Patient Medicare AWV questionnaire was not completed prior to this visit.  Cardiac Risk Factors include: advanced age (>36men, >35 women);hypertension;dyslipidemia;Other (see comment);obesity (BMI >30kg/m2), Risk factor comments: CKD     Objective:     Today's Vitals   06/16/23 1005  Weight: (!) 316 lb (143.3 kg)  Height: 5\' 4"  (1.626 m)   Body mass index is 54.24 kg/m.     06/16/2023   10:21 AM 07/08/2022    9:03 AM 09/15/2021    2:12 PM 03/20/2021    7:05 AM 12/10/2020   10:42 AM 12/06/2020   11:18 PM 11/15/2020   12:48 PM  Advanced Directives  Does Patient Have a Medical Advance Directive? Yes Yes Yes Yes Yes No No  Type of Estate agent of [place];Living will Healthcare Power of [place];Living will  Healthcare Power of [place];Living will      Does patient want to make changes to medical advance directive? No - Patient declined  No - Patient declined      Copy of Healthcare Power of Attorney in Chart? Yes - validated most recent copy scanned in chart (See row information) --  Yes - validated most recent copy scanned in chart (See row information)     Would patient like information on creating a medical advance directive?      No - Patient declined No - Guardian declined    Current Medications (verified) Outpatient Encounter Medications as of 06/16/2023  Medication Sig   acetaminophen  (TYLENOL ) 650 MG CR tablet Take 1,300 mg by mouth every 8 (eight) hours as needed for pain.   Alcohol Swabs (DROPSAFE ALCOHOL PREP) 70 % PADS USE TOPICALLY AS DIRECTED   allopurinol  (ZYLOPRIM ) 100 MG tablet Take 1 tablet (100 mg total) by mouth 2 (two) times daily. TAKE 2 TABLETS(200 MG) BY MOUTH DAILY   atorvastatin  (LIPITOR) 40 MG tablet Take 1 tablet (40 mg total) by mouth daily.   azelastine  (ASTELIN ) 0.1 % nasal spray Place 2 sprays into both nostrils 2 (two) times daily. Use in each nostril as directed   benzonatate  (TESSALON ) 200 MG capsule TAKE 1 CAPSULE(200 MG) BY MOUTH THREE TIMES DAILY AS NEEDED FOR COUGH   Blood Glucose Calibration (TRUE METRIX LEVEL 1) Low SOLN UAD  E11.9   Blood Glucose Monitoring Suppl (TRUE METRIX METER) DEVI True Metrix Level 1 solution   BREZTRI  AEROSPHERE 160-9-4.8 MCG/ACT AERO inhaler INHALE 2 PUFFS INTO THE LUNGS IN THE MORNING AND AT BEDTIME.  Calcium  Carb-Cholecalciferol  (OYSTER SHELL CALCIUM  250+D) 250-3.125 MG-MCG TABS Take 1 tablet by mouth daily at 2 PM.   calcium  carbonate (TUMS - DOSED IN MG ELEMENTAL CALCIUM ) 500 MG chewable tablet Chew 2 tablets by mouth daily as needed for indigestion or heartburn.   cephALEXin  (KEFLEX ) 500 MG capsule Take 1 capsule (500 mg total) by mouth 2 (two) times daily.   cyanocobalamin  (,VITAMIN B-12,) 1000 MCG/ML injection INJECT 1ML INTO MUSCLE EVERY 30 DAYS   dapagliflozin   propanediol (FARXIGA ) 10 MG TABS tablet Take 10 mg by mouth daily.   diclofenac  Sodium (VOLTAREN ) 1 % GEL Apply 1 application topically as needed (pain).   diphenhydrAMINE  (BENADRYL ) 12.5 MG/5ML liquid Take 25 mg by mouth 4 (four) times daily as needed for allergies.   ELIQUIS  2.5 MG TABS tablet TAKE 1 TABLET BY MOUTH TWICE DAILY   famotidine  (PEPCID ) 40 MG tablet Take 1 tablet (40 mg total) by mouth 2 (two) times daily.   FEROSUL 325 (65 Fe) MG tablet TAKE 1 TABLET BY MOUTH ON MONDAYS, WEDNESDAY, AND FRIDAY   fluconazole (DIFLUCAN) 150 MG tablet Take 150 mg by mouth every 3 (three) days.   fluticasone  (FLONASE ) 50 MCG/ACT nasal spray Place 2 sprays into both nostrils daily.   gabapentin  (NEURONTIN ) 100 MG capsule TAKE 1 CAPSULE(100 MG) BY MOUTH THREE TIMES DAILY   hydrOXYzine  (ATARAX ) 25 MG tablet Take 1 tablet (25 mg total) by mouth 3 (three) times daily as needed for itching.   metroNIDAZOLE (FLAGYL) 500 MG tablet Take 500 mg by mouth 2 (two) times daily.   mometasone  (ELOCON ) 0.1 % cream Apply topically 2 (two) times daily.   montelukast  (SINGULAIR ) 10 MG tablet TAKE 1 TABLET(10 MG) BY MOUTH AT BEDTIME   nebivolol  (BYSTOLIC ) 5 MG tablet TAKE 1 TABLET(5 MG) BY MOUTH DAILY   nystatin  ointment (MYCOSTATIN ) SMARTSIG:2 Topical Twice Daily   Oxcarbazepine  (TRILEPTAL ) 300 MG tablet TAKE 1 TABLET(300 MG) BY MOUTH TWICE DAILY   polyethylene glycol (MIRALAX  / GLYCOLAX ) 17 g packet Take 17 g by mouth daily as needed for moderate constipation.   potassium chloride  SA (KLOR-CON  M) 20 MEQ tablet TAKE 1 TABLET BY MOUTH DAILY AND 3 TABLETS BY MOUTH EVERY WEDNESDAY   Spacer/Aero-Holding Ismael Maria Use with inhaler.   spironolactone  (ALDACTONE ) 25 MG tablet TAKE 1 TABLET(25 MG) BY MOUTH DAILY   tirzepatide  (MOUNJARO ) 15 MG/0.5ML Pen INJECT 15MG  (1 PEN) UNDER THE SKIN EVERY WEEK   torsemide  (DEMADEX ) 20 MG tablet TAKE 2 TABLETS(40 MG) BY MOUTH TWICE DAILY   triamcinolone  ointment (KENALOG ) 0.5 % Apply 1  Application topically as needed (knee pain).   UNABLE TO FIND Med Name: Oxygen  portable at home 2-liters as needed   VENTOLIN  HFA 108 (90 Base) MCG/ACT inhaler Inhale 2 puffs into the lungs every 4 (four) hours as needed for wheezing.   No facility-administered encounter medications on file as of 06/16/2023.    Allergies (verified) Penicillins, Shellfish allergy, Sulfa antibiotics, Sulfonamide derivatives, Hydrochlorothiazide-triamterene, Lotensin [benazepril hcl], Dipyridamole, Estrogens, Hydrochlorothiazide, Latex, Metronidazole, Other, Spironolactone , Torsemide , Valsartan, Doxycycline , and Mustard [allyl isothiocyanate]   History: Past Medical History:  Diagnosis Date   Anemia    Asthma    CAD (coronary artery disease)    Carpal tunnel syndrome    Cellulitis of both lower extremities 04/11/2015   CHF (congestive heart failure) (HCC)    Chronic kidney disease    CKI- followed by [facility]   Colon polyp, hyperplastic 2007 & 2012   Complication of anesthesia 1999   svt  with renal calculi surgery, no problems since   CTS (carpal tunnel syndrome)    bilateral   Diabetes mellitus    Eczema    GERD (gastroesophageal reflux disease)    History of kidney stones 1999   Hyperlipidemia    Hypertension    Leg ulcer (HCC) 04/24/2015   Right lateral leg No evidence of an infection Monitor closely Keep edema controlled    Leg ulcer (HCC)    right lower   Meralgia paresthetica    Dr. Gracie Lav   Morbid obesity (HCC)    Neuropathy    toes and legs   Osteoarthrosis, unspecified whether generalized or localized, lower leg    knee   PUD (peptic ulcer disease)    Shortness of breath dyspnea    with exertion   Sleep apnea    per progress note 02/25/2018   Type II or unspecified type diabetes mellitus without mention of complication, not stated as uncontrolled    Unspecified hereditary and idiopathic peripheral neuropathy    Urticaria    Vitamin B12 deficiency    Wears glasses    Wound  cellulitis    right upper leg, healing well   Past Surgical History:  Procedure Laterality Date   ABDOMINAL HYSTERECTOMY     BIOPSY  03/20/2021   Procedure: BIOPSY;  Surgeon: Nannette Babe, MD;  Location: [facility];  Service: Gastroenterology;;   BREAST BIOPSY Right 07/08/2018   CARDIAC CATHETERIZATION  2002   non obstructive disease   colonoscopy with polypectomy  2007 & 2012    hyperplastic ;Dr Lavaughn [place]   COLONOSCOPY WITH PROPOFOL  N/A 06/04/2015   Procedure: COLONOSCOPY WITH PROPOFOL ;  Surgeon: Nannette Babe, MD;  Location: [facility];  Service: Gastroenterology;  Laterality: N/A;   COLONOSCOPY WITH PROPOFOL  N/A 03/20/2021   Procedure: COLONOSCOPY WITH PROPOFOL ;  Surgeon: Nannette Babe, MD;  Location: [facility];  Service: Gastroenterology;  Laterality: N/A;   DEBRIDEMENT LEG Right 03/02/2018   WOUND VAC APPLIED   DEBRIDEMENT LEG Right 05/27/2018   RIGHT LOWER LEG DEBRIDEMENT,  SKIN GRAFT, VAC PLACEMENT    DILATION AND CURETTAGE OF UTERUS     multiple   ESOPHAGOGASTRODUODENOSCOPY (EGD) WITH PROPOFOL  N/A 03/20/2021   Procedure: ESOPHAGOGASTRODUODENOSCOPY (EGD) WITH PROPOFOL ;  Surgeon: Nannette Babe, MD;  Location: [facility];  Service: Gastroenterology;  Laterality: N/A;   HEMORRHOID SURGERY     HEMORROIDECTOMY     I & D EXTREMITY Right 03/02/2018   Procedure: RIGHT LEG DEBRIDEMENT AND PLACE VAC;  Surgeon: Timothy Ford, MD;  Location: [facility];  Service: Orthopedics;  Laterality: Right;   I & D EXTREMITY Right 03/04/2018   Procedure: REPEAT IRRIGATION AND DEBRIDEMENT RIGHT LEG, PLACE WOUND VAC;  Surgeon: Timothy Ford, MD;  Location: [facility];  Service: Orthopedics;  Laterality: Right;   I & D EXTREMITY Right 03/30/2018   Procedure: IRRIGATION AND DEBRIDEMENT RIGHT LEG, APPLY WOUND VAC;  Surgeon: Timothy Ford, MD;  Location: [facility];  Service: Orthopedics;  Laterality: Right;   I & D EXTREMITY Right 05/27/2018   Procedure: RIGHT LOWER LEG DEBRIDEMENT,  SKIN GRAFT, VAC PLACEMENT;   Surgeon: Timothy Ford, MD;  Location: [facility];  Service: Orthopedics;  Laterality: Right;  RIGHT LOWER LEG DEBRIDEMENT,  SKIN GRAFT, VAC PLACEMENT   POLYPECTOMY  03/20/2021   Procedure: POLYPECTOMY;  Surgeon: Nannette Babe, MD;  Location: [facility];  Service: Gastroenterology;;   renal calculi  12/1997   SVT with induction of anesthesia  RIGHT HEART CATH N/A 11/19/2020   Procedure: RIGHT HEART CATH;  Surgeon: Mardell Shade, MD;  Location: [facility] INVASIVE CV LAB;  Service: Cardiovascular;  Laterality: N/A;   right knee arthroscopy     SKIN SPLIT GRAFT Right 03/04/2018   Procedure: POSSIBLE SPLIT THICKNESS SKIN GRAFT;  Surgeon: Timothy Ford, MD;  Location[facility];  Service: Orthopedics;  Laterality: Right;   SKIN SPLIT GRAFT Right 04/01/2018   Procedure: REPEAT IRRIGATION AND DEBRIDEMENT RIGHT LEG, APPLY SPLIT THICKNESS SKIN GRAFT;  Surgeon: Timothy Ford, MD;  Location: [facility];  Service: Orthopedics;  Laterality: Right;   TONSILLECTOMY AND ADENOIDECTOMY     Family History  Problem Relation Age of Onset   Colon cancer Mother    Prostate cancer Father    Colon cancer Father    Diabetes Maternal Aunt    Breast cancer Maternal Aunt    Diabetes Maternal Uncle    Diabetes Paternal Aunt    Stroke Paternal Aunt        > 65   Heart disease Paternal Aunt    Diabetes Paternal Uncle    Breast cancer Maternal Aunt         X 2   Breast cancer Cousin    Social History   Socioeconomic History   Marital status: Married    Spouse name: Financial planner   Number of children: 1   Years of education: BS   Highest education level: Not on file  Occupational History   Occupation: Disabled    Employer: RETIRED  Tobacco Use   Smoking status: Never    Passive exposure: Never   Smokeless tobacco: Never  Vaping Use   Vaping status: Never Used  Substance and Sexual Activity   Alcohol use: No    Alcohol/week: 0.0 standard drinks of alcohol   Drug use: No   Sexual activity: Not Currently  Other  Topics Concern   Not on file  Social History Narrative   Patient lives at home with her husband Arma [employer]) . Patient is retired and has a Lawyer.    Caffeine - some times.   Right handed.   Social Drivers of Health   Financial Resource Strain: Medium Risk (06/16/2023)   Overall Financial Resource Strain (CARDIA)    Difficulty of Paying Living Expenses: Somewhat hard  Food Insecurity: No Food Insecurity (06/16/2023)   Hunger Vital Sign    Worried About Running Out of Food in the Last Year: Never true    Ran Out of Food in the Last Year: Never true  Transportation Needs: No Transportation Needs (06/16/2023)   PRAPARE - Administrator, Civil Service (Medical): No    Lack of Transportation (Non-Medical): No  Physical Activity: Inactive (06/16/2023)   Exercise Vital Sign    Days of Exercise per Week: 0 days    Minutes of Exercise per Session: 0 min  Stress: Stress Concern Present (06/16/2023)   [employer] of Occupational Health - Occupational Stress Questionnaire    Feeling of Stress : To some extent  Social Connections: Moderately Isolated (06/16/2023)   Social Connection and Isolation Panel [NHANES]    Frequency of Communication with Friends and Family: More than three times a week    Frequency of Social Gatherings with Friends and Family: Never    Attends Religious Services: Never    Database administrator or Organizations: No    Attends Banker Meetings: Never    Marital Status: Married  Tobacco Counseling Counseling given: Not Answered    Clinical Intake:  Pre-visit preparation completed: Yes  Pain : No/denies pain (UTI)     BMI - recorded: 54.24 Nutritional Status: BMI > 30  Obese Nutritional Risks: None Diabetes: Yes CBG done?: No Did pt. bring in CBG monitor from home?: No  Lab Results  Component Value Date   HGBA1C 5.7 01/08/2023   HGBA1C 6.0 07/03/2022   HGBA1C 5.9 12/30/2021     How often do you need to have  someone help you when you read instructions, pamphlets, or other written materials from your doctor or pharmacy?: 1 - Never  Interpreter Needed?: No  Information entered by :: Nichalas Coin, RMA   Activities of Daily Living     06/16/2023   10:05 AM  In your present state of health, do you have any difficulty performing the following activities:  Hearing? 0  Vision? 0  Difficulty concentrating or making decisions? 0  Walking or climbing stairs? 0  Dressing or bathing? 0  Doing errands, shopping? 0  Comment her husband drives  Preparing Food and eating ? N  Using the Toilet? N  In the past six months, have you accidently leaked urine? N  Do you have problems with loss of bowel control? N  Managing your Medications? N  Managing your Finances? N  Housekeeping or managing your Housekeeping? N    Patient Care Team: Colene Dauphin, MD as PCP - General (Internal Medicine)  Indicate any recent Medical Services you may have received from other than [facility] providers in the past year (date may be approximate).     Assessment:    This is a routine wellness examination for April [name].  Hearing/Vision screen Hearing Screening - Comments:: Denies hearing difficulties   Vision Screening - Comments:: Wears eyeglasses   Goals Addressed   None    Depression Screen     06/16/2023   10:25 AM 07/08/2022    9:00 AM 07/08/2022    8:38 AM 12/30/2021   10:04 AM 12/30/2021    9:47 AM 09/15/2021    2:07 PM 08/29/2021    1:52 PM  PHQ 2/9 Scores  PHQ - 2 Score 3 0 0 0 0 0 0  PHQ- 9 Score 5      0    Fall Risk     06/16/2023   10:22 AM 07/08/2022    8:38 AM 12/30/2021   10:04 AM 12/30/2021    9:47 AM 09/15/2021    2:12 PM  Fall Risk   Falls in the past year? 1 1 0 0 1  Comment 2024      Number falls in past yr: 1 0 0 0 1  Injury with Fall? 0 0 0 0 1  Risk for fall due to :  History of fall(s) No Fall Risks No Fall Risks History of fall(s);Impaired mobility  Follow up Falls evaluation  completed;Falls prevention discussed Falls evaluation completed Falls evaluation completed Falls evaluation completed Falls evaluation completed    MEDICARE RISK AT HOME:  Medicare Risk at Home Any stairs in or around the home?: Yes (has a ramp in back) If so, are there any without handrails?: Yes Home free of loose throw rugs in walkways, pet beds, electrical cords, etc?: Yes Adequate lighting in your home to reduce risk of falls?: Yes Life alert?: No Use of a cane, walker or w/c?: Yes (all three) Grab bars in the bathroom?: No Shower chair or bench in shower?: Yes Elevated toilet  seat or a handicapped toilet?: Yes  TIMED UP AND GO:  Was the test performed?  No  Cognitive Function: 6CIT completed        07/08/2022    8:46 AM 09/15/2021    2:15 PM  6CIT Screen  What Year? 0 points 0 points  What month? 0 points 0 points  What time? 0 points 0 points  Count back from 20 0 points 0 points  Months in reverse 0 points 0 points  Repeat phrase 0 points 0 points  Total Score 0 points 0 points    Immunizations Immunization History  Administered Date(s) Administered   Fluad Quad(high Dose 65+) 10/28/2018, 10/18/2020, 11/07/2021   Fluad Trivalent(High Dose 65+) 11/12/2022   Influenza Split 12/03/2010, 11/03/2011   Influenza Whole 11/16/2006, 11/22/2007, 10/31/2008, 10/29/2009   Influenza, High Dose Seasonal PF 10/10/2015, 11/06/2016, 11/16/2017, 11/08/2019   Influenza,inj,Quad PF,6+ Mos 10/19/2012, 10/25/2013, 10/29/2014   Influenza-Unspecified 10/23/2016, 11/18/2016   Moderna Sars-Covid-2 Vaccination 03/13/2019, 04/10/2019, 12/06/2019   PFIZER Comirnaty(Gray Top)Covid-19 Tri-Sucrose Vaccine 11/14/2021   Pfizer Covid-19 Vaccine Bivalent Booster 50yrs & up 11/08/2020   Pneumococcal Conjugate-13 06/07/2015   Pneumococcal Polysaccharide-23 03/24/2005, 01/04/2012, 06/29/2017   Respiratory Syncytial Virus Vaccine,Recomb Aduvanted(Arexvy) 01/06/2023   Td 06/24/2009   Tdap 10/09/2014    Zoster Recombinant(Shingrix) 08/11/2016, 10/23/2016   Zoster, Live 01/15/2012    Screening Tests Health Maintenance  Topic Date Due   COVID-19 Vaccine (6 - 2024-25 season) 10/04/2022   Medicare Annual Wellness (AWV)  07/08/2023   HEMOGLOBIN A1C  07/09/2023   INFLUENZA VACCINE  09/03/2023   FOOT EXAM  11/23/2023   OPHTHALMOLOGY EXAM  01/22/2024   Diabetic kidney evaluation - eGFR measurement  05/14/2024   Diabetic kidney evaluation - Urine ACR  05/14/2024   DEXA SCAN  08/01/2024   MAMMOGRAM  08/24/2024   DTaP/Tdap/Td (3 - Td or Tdap) 10/08/2024   Colonoscopy  03/21/2031   Pneumonia Vaccine 56+ Years old  Completed   Hepatitis C Screening  Completed   HPV VACCINES  Aged Out   Meningococcal B Vaccine  Aged Out   Zoster Vaccines- Shingrix  Discontinued    Health Maintenance  Health Maintenance Due  Topic Date Due   COVID-19 Vaccine (6 - 2024-25 season) 10/04/2022   Medicare Annual Wellness (AWV)  07/08/2023   Health Maintenance Items Addressed: See Nurse Notes  Additional Screening:  Vision Screening: Recommended annual ophthalmology exams for early detection of glaucoma and other disorders of the eye.  Dental Screening: Recommended annual dental exams for proper oral hygiene  Community Resource Referral / Chronic Care Management: CRR required this visit?  No   CCM required this visit?  No   Plan:    I have personally reviewed and noted the following in the patient's chart:   Medical and social history Use of alcohol, tobacco or illicit drugs  Current medications and supplements including opioid prescriptions. Patient is not currently taking opioid prescriptions. Functional ability and status Nutritional status Physical activity Advanced directives List of other physicians Hospitalizations, surgeries, and ER visits in previous 12 months Vitals Screenings to include cognitive, depression, and falls Referrals and appointments  In addition, I have  reviewed and discussed with patient certain preventive protocols, quality metrics, and best practice recommendations. A written personalized care plan for preventive services as well as general preventive health recommendations were provided to patient.   Dare Sanger L Mervin Ramires, CMA   06/16/2023   After Visit Summary: (Mail) Due to this being a telephonic visit, the after visit  summary with patients personalized plan was offered to patient via mail   Notes: Please refer to Routing Comments.

## 2023-06-16 NOTE — Patient Instructions (Signed)
 April [name] , Thank you for taking time out of your busy schedule to complete your Annual Wellness Visit with me. I enjoyed our conversation and look forward to speaking with you again next year. I, as well as your care team,  appreciate your ongoing commitment to your health goals. Please review the following plan we discussed and let me know if I can assist you in the future. Your Game plan/ To Do List     Follow up Visits: Next Medicare AWV with our clinical staff: 06/16/2024.   Have you seen your provider in the last 6 months (3 months if uncontrolled diabetes)? Yes Next Office Visit with your provider: 07/09/2023.  Clinician Recommendations:  Aim for 30 minutes of exercise or brisk walking, 6-8 glasses of water, and 5 servings of fruits and vegetables each day. Get well soon.      This is a list of the screening recommended for you and due dates:  Health Maintenance  Topic Date Due   COVID-19 Vaccine (6 - 2024-25 season) 10/04/2022   Medicare Annual Wellness Visit  07/08/2023   Hemoglobin A1C  07/09/2023   Flu Shot  09/03/2023   Complete foot exam   11/23/2023   Eye exam for diabetics  01/22/2024   Yearly kidney function blood test for diabetes  05/14/2024   Yearly kidney health urinalysis for diabetes  05/14/2024   DEXA scan (bone density measurement)  08/01/2024   Mammogram  08/24/2024   DTaP/Tdap/Td vaccine (3 - Td or Tdap) 10/08/2024   Colon Cancer Screening  03/21/2031   Pneumonia Vaccine  Completed   Hepatitis C Screening  Completed   HPV Vaccine  Aged Out   Meningitis B Vaccine  Aged Out   Zoster (Shingles) Vaccine  Discontinued    Advanced directives: (In Chart) A copy of your advanced directives are scanned into your chart should your provider ever need it. Advance Care Planning is important because it:  [x]  Makes sure you receive the medical care that is consistent with your values, goals, and preferences  [x]  It provides guidance to your family and loved ones and  reduces their decisional burden about whether or not they are making the right decisions based on your wishes.  Follow the link provided in your after visit summary or read over the paperwork we have mailed to you to help you started getting your Advance Directives in place. If you need assistance in completing these, please reach out to us  so that we can help you!  See attachments for Preventive Care and Fall Prevention Tips.

## 2023-06-17 DIAGNOSIS — N390 Urinary tract infection, site not specified: Secondary | ICD-10-CM | POA: Diagnosis not present

## 2023-06-21 ENCOUNTER — Other Ambulatory Visit: Payer: Self-pay | Admitting: Internal Medicine

## 2023-06-21 ENCOUNTER — Ambulatory Visit: Admitting: Nurse Practitioner

## 2023-06-22 ENCOUNTER — Telehealth: Payer: Self-pay | Admitting: Internal Medicine

## 2023-06-22 NOTE — Telephone Encounter (Signed)
 Copied from CRM [id number]. Topic: Clinical - Red Word Triage >> Jun 22, 2023 10:39 AM Alethia Huxley E wrote: [facility] that prompted transfer to Nurse Triage: Patient denied nurse triage** Stating that she is having pain in both of her knees and it got bad last night. Questioning if she is able to have PCP write a prescription for Voltaren  pills to help with pain. Callback number for patient is [phone].

## 2023-06-22 NOTE — Telephone Encounter (Signed)
 She cannot take any Voltaren  pills or anti-inflammatory pills because of her decreased kidney function.  She needs to try OTC Voltaren  gel or capsaicin arthritis cream/Biofreeze.

## 2023-06-23 NOTE — Telephone Encounter (Signed)
 Spoke with patient today.

## 2023-06-25 ENCOUNTER — Ambulatory Visit (INDEPENDENT_AMBULATORY_CARE_PROVIDER_SITE_OTHER)

## 2023-06-25 DIAGNOSIS — E538 Deficiency of other specified B group vitamins: Secondary | ICD-10-CM

## 2023-06-25 MED ORDER — CYANOCOBALAMIN 1000 MCG/ML IJ SOLN
1000.0000 ug | Freq: Once | INTRAMUSCULAR | Status: AC
  Administered 2023-06-25: 1000 ug via INTRAMUSCULAR

## 2023-06-25 NOTE — Progress Notes (Signed)
 Pt here for monthly B12 injection per Dr. Donnette Gal.   B12 1000mcg given IM and pt tolerated injection well.  Next B12 injection has been scheduled.

## 2023-07-01 ENCOUNTER — Telehealth: Payer: Self-pay

## 2023-07-01 NOTE — Telephone Encounter (Signed)
 Copied from CRM [id number]. Topic: Clinical - Medication Question >> Jul 01, 2023  2:54 PM Eveleen Hinds B wrote: Reason for CRM: Patient call for refill on BREZTRI  AEROSPHERE 160-9-4.8 MCG/ACT AERO inhaler. I asked patient about the pharmacy to send the script to and she said no, no pharmacy.  She stated [place] of the [place] and the script has to be faxed to [phone].  I am unsure how to process this refill.   LM ok per HIPAA   Patient over due for follow up.needs to make appointment.

## 2023-07-06 ENCOUNTER — Other Ambulatory Visit: Payer: Self-pay

## 2023-07-06 MED ORDER — BREZTRI AEROSPHERE 160-9-4.8 MCG/ACT IN AERO: 2.0000 | INHALATION_SPRAY | Freq: Two times a day (BID) | RESPIRATORY_TRACT | 3 refills | Status: DC

## 2023-07-06 MED ORDER — BREZTRI AEROSPHERE 160-9-4.8 MCG/ACT IN AERO: 2.0000 | INHALATION_SPRAY | Freq: Two times a day (BID) | RESPIRATORY_TRACT | 3 refills | Status: AC

## 2023-07-06 NOTE — Telephone Encounter (Signed)
 Copied from CRM [id number]. Topic: Clinical - Medication Question >> Jul 01, 2023  2:54 PM Eveleen Hinds B wrote: Reason for CRM: Patient call for refill on BREZTRI  AEROSPHERE 160-9-4.8 MCG/ACT AERO inhaler. I asked patient about the pharmacy to send the script to and she said no, no pharmacy.  She stated she [place] a part of the [place] program and the script has to be faxed to [phone].  I am unsure how to process this refill. >> Jul 05, 2023  9:47 AM Margarette Shawl wrote: Pt is calling about Breztri  refill with [place] program. Advised the nurse reached out to schedule appt, due to being past due. Pt advised she is scheduled for 08/28 with [facility], soonest appt avail. Requesting if medication could be refilled until she is able to be seen. Appt is added to waitlist.  # [phone], pt states refill needs to be faxed to # [phone].   The patient is scheduled for 09/30/23 Spoke with pt. Med refill has been sent to [facility] pharmacy & pt is aware. Nothing further needed.

## 2023-07-07 ENCOUNTER — Telehealth: Payer: Self-pay | Admitting: Student

## 2023-07-07 NOTE — Telephone Encounter (Signed)
 See three other encounters that are closed for this PT's RX thru the [place] program. She states we have to fax it to [place] and gives the FAX number. She is upset because of several calls and now she has to call [facility] and cancel the RX they started processing. I assured her I'd get this in the right hands. Please call or fwd the front a message to the front to call to let PT know it is in process. Thanks.

## 2023-07-07 NOTE — Telephone Encounter (Signed)
 Called and spoke with the pt. Advised that [facility] received fax from [facility] and we faxed rx script yesterday signed by [facility]. Per previous encounter when I spoke with pt she advised me on the phone to send it to [facility] so she could get her inhaler faster while waiting for script to be sent to [place]. NFN

## 2023-07-08 ENCOUNTER — Encounter: Payer: Self-pay | Admitting: Internal Medicine

## 2023-07-08 NOTE — Patient Instructions (Addendum)
    Prevnar 20 vaccine for pneumonia given.    Blood work was ordered.       Medications changes include :   None     Return in about 6 months (around 01/08/2024) for Physical Exam.

## 2023-07-08 NOTE — Progress Notes (Unsigned)
 Subjective:    Patient ID: April [name], female    DOB: 10-29-1950, 73 y.o.   MRN: [id number]     HPI April [name] is here for follow up of her chronic medical problems.   Maybe once a week - fingers get numb on both hands.  Typically she has to move around and will sometimes go away.  Left anaterior lateral wrist cyst -she was not sure what it was.  She notes now on her other wrist.  It does not hurt.  Had a scratch in her right anterior lower leg - thought she scrathced it - put alcohol on it.  It burned.  Last night it hurt.  Today feels better.  She is concerned if there is a break in the skin about developing cellulitis.   Using O2 at night prn - usually uses it during day prn  Asthma - use inhalers? - uses both prn - with colds    Medications and allergies reviewed with patient and updated if appropriate.  Current Outpatient Medications on File Prior to Visit  Medication Sig Dispense Refill   acetaminophen  (TYLENOL ) 650 MG CR tablet Take 1,300 mg by mouth every 8 (eight) hours as needed for pain.     Alcohol Swabs (DROPSAFE ALCOHOL PREP) 70 % PADS USE TOPICALLY AS DIRECTED 300 each 3   allopurinol  (ZYLOPRIM ) 100 MG tablet Take 1 tablet (100 mg total) by mouth 2 (two) times daily. TAKE 2 TABLETS(200 MG) BY MOUTH DAILY 180 tablet 1   atorvastatin  (LIPITOR) 40 MG tablet Take 1 tablet (40 mg total) by mouth daily. 90 tablet 2   azelastine  (ASTELIN ) 0.1 % nasal spray Place 2 sprays into both nostrils 2 (two) times daily. Use in each nostril as directed 30 mL 5   benzonatate  (TESSALON ) 200 MG capsule TAKE 1 CAPSULE(200 MG) BY MOUTH THREE TIMES DAILY AS NEEDED FOR COUGH 60 capsule 1   Blood Glucose Calibration (TRUE METRIX LEVEL 1) Low SOLN UAD  E11.9 1 each 3   Blood Glucose Monitoring Suppl (TRUE METRIX METER) DEVI True Metrix Level 1 solution     budesonide -glycopyrrolate -formoterol  (BREZTRI  AEROSPHERE) 160-9-4.8 MCG/ACT AERO inhaler Inhale 2 puffs into the lungs in the  morning and at bedtime. 3 each 3   Calcium  Carb-Cholecalciferol  (OYSTER SHELL CALCIUM  250+D) 250-3.125 MG-MCG TABS Take 1 tablet by mouth daily at 2 PM.     calcium  carbonate (TUMS - DOSED IN MG ELEMENTAL CALCIUM ) 500 MG chewable tablet Chew 2 tablets by mouth daily as needed for indigestion or heartburn.     cyanocobalamin  (,VITAMIN B-12,) 1000 MCG/ML injection INJECT INTO MUSCLE EVERY 30 DAYS 10 mL 5   dapagliflozin  propanediol (FARXIGA ) 10 MG TABS tablet Take 10 mg by mouth daily.     diclofenac  Sodium (VOLTAREN ) 1 % GEL Apply 1 application topically as needed (pain).     diphenhydrAMINE  (BENADRYL ) 12.5 MG/5ML liquid Take 25 mg by mouth 4 (four) times daily as needed for allergies.     ELIQUIS  2.5 MG TABS tablet TAKE 1 TABLET BY MOUTH TWICE DAILY 180 tablet 1   famotidine  (PEPCID ) 40 MG tablet Take 1 tablet (40 mg total) by mouth 2 (two) times daily. 180 tablet 3   FEROSUL 325 (65 Fe) MG tablet TAKE 1 TABLET BY MOUTH ON MONDAYS, WEDNESDAY, AND FRIDAY 60 tablet 0   fluconazole (DIFLUCAN) 150 MG tablet Take 150 mg by mouth every 3 (three) days.     fluticasone  (FLONASE ) 50 MCG/ACT nasal spray  Place 2 sprays into both nostrils daily. 18.2 mL 2   gabapentin  (NEURONTIN ) 100 MG capsule TAKE 1 CAPSULE(100 MG) BY MOUTH THREE TIMES DAILY 90 capsule 1   hydrOXYzine  (ATARAX ) 25 MG tablet Take 1 tablet (25 mg total) by mouth 3 (three) times daily as needed for itching. 90 tablet 1   metroNIDAZOLE (FLAGYL) 500 MG tablet Take 500 mg by mouth 2 (two) times daily.     mometasone  (ELOCON ) 0.1 % cream Apply topically 2 (two) times daily.     montelukast  (SINGULAIR ) 10 MG tablet TAKE 1 TABLET(10 MG) BY MOUTH AT BEDTIME 30 tablet 5   nebivolol  (BYSTOLIC ) 5 MG tablet TAKE 1 TABLET(5 MG) BY MOUTH DAILY 90 tablet 1   nystatin  ointment (MYCOSTATIN ) SMARTSIG:2 Topical Twice Daily     Oxcarbazepine  (TRILEPTAL ) 300 MG tablet TAKE 1 TABLET(300 MG) BY MOUTH TWICE DAILY 180 tablet 0   polyethylene glycol (MIRALAX  /  GLYCOLAX ) 17 g packet Take 17 g by mouth daily as needed for moderate constipation.     potassium chloride  SA (KLOR-CON  M) 20 MEQ tablet TAKE 1 TABLET BY MOUTH DAILY AND 3 TABLETS BY MOUTH EVERY WEDNESDAY 102 tablet 3   Spacer/Aero-Holding Ismael Maria Use with inhaler. 1 each 2   spironolactone  (ALDACTONE ) 25 MG tablet TAKE 1 TABLET(25 MG) BY MOUTH DAILY 90 tablet 1   tirzepatide  (MOUNJARO ) 15 MG/0.5ML Pen INJECT 15MG  (1 PEN) UNDER THE SKIN EVERY WEEK 6 mL 3   torsemide  (DEMADEX ) 20 MG tablet TAKE 2 TABLETS(40 MG) BY MOUTH TWICE DAILY 360 tablet 3   triamcinolone  ointment (KENALOG ) 0.5 % Apply 1 Application topically as needed (knee pain).     UNABLE TO FIND Med Name: Oxygen  portable at home 2-liters as needed     VENTOLIN  HFA 108 (90 Base) MCG/ACT inhaler Inhale 2 puffs into the lungs every 4 (four) hours as needed for wheezing.     No current facility-administered medications on file prior to visit.     Review of Systems  Constitutional:  Negative for fever.  Respiratory:  Positive for cough, shortness of breath (chronic - same) and wheezing.   Cardiovascular:  Positive for leg swelling. Negative for chest pain and palpitations.  Gastrointestinal:        Occ gerd  Neurological:  Positive for headaches (occ). Negative for light-headedness.       Objective:   Vitals:   07/09/23 1020  BP: 130/80  Pulse: 81  Temp: 98.2 F (36.8 C)  SpO2: 90%   BP Readings from Last 3 Encounters:  07/09/23 130/80  04/27/23 138/78  04/22/23 124/73   Wt Readings from Last 3 Encounters:  06/16/23 (!) 316 lb (143.3 kg)  04/27/23 (!) 316 lb (143.3 kg)  04/22/23 (!) 316 lb (143.3 kg)   Body mass index is 54.24 kg/m.    Physical Exam Constitutional:      General: She is not in acute distress.    Appearance: Normal appearance.  HENT:     Head: Normocephalic and atraumatic.  Eyes:     Conjunctiva/sclera: Conjunctivae normal.  Cardiovascular:     Rate and Rhythm: Normal rate and regular  rhythm.     Heart sounds: Normal heart sounds.  Pulmonary:     Effort: Pulmonary effort is normal. No respiratory distress.     Breath sounds: Normal breath sounds. No wheezing.  Musculoskeletal:     Cervical back: Neck supple.     Right lower leg: Edema present.     Left lower leg: Edema  present.  Lymphadenopathy:     Cervical: No cervical adenopathy.  Skin:    General: Skin is warm and dry.     Findings: No rash.  Neurological:     Mental Status: She is alert. Mental status is at baseline.  Psychiatric:        Mood and Affect: Mood normal.        Behavior: Behavior normal.        Lab Results  Component Value Date   WBC 6.7 01/08/2023   HGB 13.8 01/08/2023   HCT 40.7 01/08/2023   PLT 182.0 01/08/2023   GLUCOSE 84 01/08/2023   CHOL 128 01/08/2023   TRIG 74.0 01/08/2023   HDL 30.90 (L) 01/08/2023   LDLDIRECT 169.4 05/02/2007   LDLCALC 82 01/08/2023   ALT 13 01/08/2023   AST 18 01/08/2023   NA 137 01/08/2023   K 4.2 01/08/2023   CL 97 01/08/2023   CREATININE 1.77 (H) 01/08/2023   BUN 20 01/08/2023   CO2 34 (H) 01/08/2023   TSH 4.317 11/15/2020   INR 1.0 07/04/2006   HGBA1C 5.7 01/08/2023   MICROALBUR 1.8 08/29/2021     Assessment & Plan:   Prevnar 20 given today.   See Problem List for Assessment and Plan of chronic medical problems.

## 2023-07-09 ENCOUNTER — Other Ambulatory Visit (INDEPENDENT_AMBULATORY_CARE_PROVIDER_SITE_OTHER)

## 2023-07-09 ENCOUNTER — Ambulatory Visit: Payer: Medicare PPO | Admitting: Internal Medicine

## 2023-07-09 VITALS — BP 130/80 | HR 81 | Temp 98.2°F | Ht 64.0 in

## 2023-07-09 DIAGNOSIS — Z7985 Long-term (current) use of injectable non-insulin antidiabetic drugs: Secondary | ICD-10-CM

## 2023-07-09 DIAGNOSIS — E1140 Type 2 diabetes mellitus with diabetic neuropathy, unspecified: Secondary | ICD-10-CM | POA: Diagnosis not present

## 2023-07-09 DIAGNOSIS — E669 Obesity, unspecified: Secondary | ICD-10-CM

## 2023-07-09 DIAGNOSIS — G609 Hereditary and idiopathic neuropathy, unspecified: Secondary | ICD-10-CM | POA: Diagnosis not present

## 2023-07-09 DIAGNOSIS — I10 Essential (primary) hypertension: Secondary | ICD-10-CM

## 2023-07-09 DIAGNOSIS — M109 Gout, unspecified: Secondary | ICD-10-CM

## 2023-07-09 DIAGNOSIS — N1832 Chronic kidney disease, stage 3b: Secondary | ICD-10-CM

## 2023-07-09 DIAGNOSIS — J4540 Moderate persistent asthma, uncomplicated: Secondary | ICD-10-CM

## 2023-07-09 DIAGNOSIS — K219 Gastro-esophageal reflux disease without esophagitis: Secondary | ICD-10-CM

## 2023-07-09 DIAGNOSIS — R600 Localized edema: Secondary | ICD-10-CM

## 2023-07-09 DIAGNOSIS — Z23 Encounter for immunization: Secondary | ICD-10-CM | POA: Diagnosis not present

## 2023-07-09 DIAGNOSIS — I5032 Chronic diastolic (congestive) heart failure: Secondary | ICD-10-CM | POA: Diagnosis not present

## 2023-07-09 DIAGNOSIS — E782 Mixed hyperlipidemia: Secondary | ICD-10-CM | POA: Diagnosis not present

## 2023-07-09 DIAGNOSIS — E538 Deficiency of other specified B group vitamins: Secondary | ICD-10-CM

## 2023-07-09 DIAGNOSIS — Z6841 Body Mass Index (BMI) 40.0 and over, adult: Secondary | ICD-10-CM

## 2023-07-09 DIAGNOSIS — G4736 Sleep related hypoventilation in conditions classified elsewhere: Secondary | ICD-10-CM

## 2023-07-09 DIAGNOSIS — Z86718 Personal history of other venous thrombosis and embolism: Secondary | ICD-10-CM

## 2023-07-09 LAB — CBC WITH DIFFERENTIAL/PLATELET
Basophils Absolute: 0.1 10*3/uL (ref 0.0–0.1)
Basophils Relative: 1.3 % (ref 0.0–3.0)
Eosinophils Absolute: 0.1 10*3/uL (ref 0.0–0.7)
Eosinophils Relative: 1.8 % (ref 0.0–5.0)
HCT: 40.1 % (ref 36.0–46.0)
Hemoglobin: 13 g/dL (ref 12.0–15.0)
Lymphocytes Relative: 24.8 % (ref 12.0–46.0)
Lymphs Abs: 1.5 10*3/uL (ref 0.7–4.0)
MCHC: 32.5 g/dL (ref 30.0–36.0)
MCV: 93.8 fl (ref 78.0–100.0)
Monocytes Absolute: 0.4 10*3/uL (ref 0.1–1.0)
Monocytes Relative: 6.3 % (ref 3.0–12.0)
Neutro Abs: 4 10*3/uL (ref 1.4–7.7)
Neutrophils Relative %: 65.8 % (ref 43.0–77.0)
Platelets: 160 10*3/uL (ref 150.0–400.0)
RBC: 4.27 Mil/uL (ref 3.87–5.11)
RDW: 15.9 % — ABNORMAL HIGH (ref 11.5–15.5)
WBC: 6 10*3/uL (ref 4.0–10.5)

## 2023-07-09 LAB — COMPREHENSIVE METABOLIC PANEL WITH GFR
ALT: 15 U/L (ref 0–35)
AST: 17 U/L (ref 0–37)
Albumin: 3.9 g/dL (ref 3.5–5.2)
Alkaline Phosphatase: 88 U/L (ref 39–117)
BUN: 27 mg/dL — ABNORMAL HIGH (ref 6–23)
CO2: 36 meq/L — ABNORMAL HIGH (ref 19–32)
Calcium: 9.6 mg/dL (ref 8.4–10.5)
Chloride: 96 meq/L (ref 96–112)
Creatinine, Ser: 2.05 mg/dL — ABNORMAL HIGH (ref 0.40–1.20)
GFR: 23.69 mL/min — ABNORMAL LOW (ref >60.00)
Glucose, Bld: 92 mg/dL (ref 70–99)
Potassium: 4.7 meq/L (ref 3.5–5.1)
Sodium: 138 meq/L (ref 135–145)
Total Bilirubin: 0.6 mg/dL (ref 0.2–1.2)
Total Protein: 7.2 g/dL (ref 6.0–8.3)

## 2023-07-09 LAB — HEMOGLOBIN A1C: Hgb A1c MFr Bld: 6.1 % (ref 4.6–6.5)

## 2023-07-09 LAB — LIPID PANEL
Cholesterol: 149 mg/dL (ref 0–200)
HDL: 32.8 mg/dL — ABNORMAL LOW (ref >39.00)
LDL Cholesterol: 97 mg/dL (ref 0–99)
NonHDL: 115.74
Total CHOL/HDL Ratio: 5
Triglycerides: 94 mg/dL (ref 0.0–149.0)
VLDL: 18.8 mg/dL (ref 0.0–40.0)

## 2023-07-09 NOTE — Assessment & Plan Note (Signed)
Chronic Denies gout flares Continue allopurinol 200 mg daily

## 2023-07-09 NOTE — Assessment & Plan Note (Signed)
 Chronic Appears euvolemic-has chronic leg swelling Continue torsemide 40 mg twice daily Continue spironolactone 25 mg daily CMP Stressed low-sodium diet

## 2023-07-09 NOTE — Assessment & Plan Note (Signed)
 Chronic   Lab Results  Component Value Date   HGBA1C 5.7 01/08/2023   Sugars well controlled Check A1c Continue Farxiga  10 mg daily, Mounjaro  15 mg weekly Stressed regular exercise, diabetic diet

## 2023-07-09 NOTE — Assessment & Plan Note (Addendum)
Chronic ?Continue monthly B12 injections ? ?

## 2023-07-09 NOTE — Assessment & Plan Note (Signed)
 Chronic Encouraged as much exercise as possible Healthy diet encouraged Check lipid panel  Continue atorvastatin  40 mg daily

## 2023-07-09 NOTE — Assessment & Plan Note (Signed)
 Chronic Following with Dr. Signe Colt Continue Marcelline Deist 10 mg daily, torsemide 40 mg twice daily CBC, CMP Continue weight loss efforts

## 2023-07-09 NOTE — Assessment & Plan Note (Signed)
Chronic Continue gabapentin 100 mg 3 times daily, Trileptal 300 mg twice daily

## 2023-07-09 NOTE — Assessment & Plan Note (Signed)
 Chronic She still has leg edema, but to me it looks fairly controlled Encouraged as much activity as possible CMP Continue torsemide 40 mg twice daily  Continue spironolactone 25 mg daily

## 2023-07-09 NOTE — Assessment & Plan Note (Signed)
 Chronic Blood pressure controlled CBC, CMP Continue Bystolic  5 mg daily, spironolactone  25 mg daily, torsemide  40 mg twice daily

## 2023-07-09 NOTE — Assessment & Plan Note (Signed)
 Chronic Currently on Mounjaro 15 mg weekly Encourage as much activity as possible Healthy diet, small portions, low in sugars and carbs Discussed the [name] will work less because she has been on it for a while so she may need to making more effort to try to help with weight loss

## 2023-07-09 NOTE — Assessment & Plan Note (Addendum)
 Chronic GERD controlled Continue famotidine  to 40 mg twice daily, Tums prn

## 2023-07-09 NOTE — Assessment & Plan Note (Signed)
History of DVT 05/2020 She is very sedentary and high risk of recurrence of DVT Continue to Eliquis 2.5 mg twice daily for prevention 

## 2023-07-09 NOTE — Assessment & Plan Note (Signed)
 Uses oxygen  at night as needed, also uses oxygen  during the day as needed Continue as needed oxygen  use

## 2023-07-09 NOTE — Addendum Note (Signed)
 Addended by: Katherene Pals on: 07/09/2023 01:16 PM   Modules accepted: Orders

## 2023-07-12 ENCOUNTER — Telehealth: Payer: Self-pay | Admitting: Nurse Practitioner

## 2023-07-12 ENCOUNTER — Other Ambulatory Visit: Payer: Self-pay | Admitting: Internal Medicine

## 2023-07-12 DIAGNOSIS — J4541 Moderate persistent asthma with (acute) exacerbation: Secondary | ICD-10-CM

## 2023-07-12 MED ORDER — BENZONATATE 200 MG PO CAPS
200.0000 mg | ORAL_CAPSULE | Freq: Two times a day (BID) | ORAL | 1 refills | Status: DC | PRN
Start: 2023-07-12 — End: 2023-09-22

## 2023-07-12 NOTE — Telephone Encounter (Signed)
 Called the pt I advised her that we sent refill for tessalon , but needs to make sure she keeps the appt she has pending  Pt verbalized understanding  Nothing further needed

## 2023-07-12 NOTE — Telephone Encounter (Signed)
 Copied from CRM [id number]. Topic: Clinical - Medication Refill >> Jul 12, 2023  9:57 AM Hilton Lucky wrote: Medication: benzonatate  (TESSALON ) 200 MG capsule  Has the patient contacted their pharmacy? No  This is the patient's preferred pharmacy:  [facility] [address], [place] - [address]  [place] [address] Phone: [phone] Fax: [phone]   Is this the correct pharmacy for this prescription? Yes If no, delete pharmacy and type the correct one.   Has the prescription been filled recently? No  Is the patient out of the medication? Yes  Has the patient been seen for an appointment in the last year OR does the patient have an upcoming appointment? Yes  Can we respond through MyChart? No  Agent: Please be advised that Rx refills may take up to 3 business days. We ask that you follow-up with your pharmacy.

## 2023-07-15 ENCOUNTER — Ambulatory Visit: Payer: Self-pay | Admitting: Internal Medicine

## 2023-07-15 LAB — VITAMIN D 25 HYDROXY (VIT D DEFICIENCY, FRACTURES): VITD: 52.57 ng/mL (ref 30.00–100.00)

## 2023-07-17 DIAGNOSIS — J9601 Acute respiratory failure with hypoxia: Secondary | ICD-10-CM | POA: Diagnosis not present

## 2023-07-17 DIAGNOSIS — I5033 Acute on chronic diastolic (congestive) heart failure: Secondary | ICD-10-CM | POA: Diagnosis not present

## 2023-07-17 DIAGNOSIS — J9602 Acute respiratory failure with hypercapnia: Secondary | ICD-10-CM | POA: Diagnosis not present

## 2023-07-28 ENCOUNTER — Ambulatory Visit (INDEPENDENT_AMBULATORY_CARE_PROVIDER_SITE_OTHER)

## 2023-07-28 DIAGNOSIS — E538 Deficiency of other specified B group vitamins: Secondary | ICD-10-CM

## 2023-07-28 MED ORDER — CYANOCOBALAMIN 1000 MCG/ML IJ SOLN
1000.0000 ug | Freq: Once | INTRAMUSCULAR | Status: AC
Start: 2023-07-28 — End: 2023-07-28
  Administered 2023-07-28: 1000 ug via INTRAMUSCULAR

## 2023-07-28 NOTE — Progress Notes (Signed)
After obtaining consent, and per orders of Dr. Lawerance Bach, injection of B12 was given by Ferdie Ping. Patient instructed to report any adverse reaction to me immediately.

## 2023-07-29 ENCOUNTER — Telehealth: Payer: Self-pay | Admitting: Internal Medicine

## 2023-07-29 DIAGNOSIS — Z0184 Encounter for antibody response examination: Secondary | ICD-10-CM

## 2023-07-29 NOTE — Telephone Encounter (Signed)
 Copied from CRM [id number]. Topic: Appointments - Scheduling Inquiry for Clinic >> Jul 29, 2023  9:50 AM [name] wrote: Reason for CRM: Patient is questioning if she is due for a Measles vaccine at her age. Callback number [phone].

## 2023-07-29 NOTE — Telephone Encounter (Signed)
 We can check for immunity -- blood work ordered

## 2023-07-30 NOTE — Telephone Encounter (Signed)
 Spoke with patient today.

## 2023-08-02 ENCOUNTER — Ambulatory Visit: Payer: Self-pay

## 2023-08-02 NOTE — Telephone Encounter (Signed)
 Spoke with patient and appointment offered this afternoon with Corean, NP but patient declined as she is unable to get her. States she called Urology for appointment and is waiting to hear back from them. She was also going to call her GYN to see if they can see her tomorrow for possible work up and pelvic exam.  Patient advised she really should be seen today or ASAP and to call office back and schedule appointment if specialists are unable to see her.   Patient voiced understanding and agreeable to plan.

## 2023-08-02 NOTE — Telephone Encounter (Signed)
 FYI Only or Action Required?: FYI only for provider.  Patient was last seen in primary care on 07/09/2023 by Geofm Glade PARAS, MD. Called Nurse Triage reporting No chief complaint on file.. Symptoms began a week ago. Interventions attempted: Nothing. Symptoms are: gradually worsening.  Triage Disposition: No disposition on file.  Patient/caregiver understands and will follow disposition?:   Copied from CRM [id number]. Topic: Clinical - Red Word Triage >> Aug 02, 2023 12:07 PM Mercedes MATSU wrote: Red Word that prompted transfer to Nurse Triage: Patient called in stating that Sunday night she had a large amount of blood in her urine. She states that today she does not have none, but she is highly concerned and wants to know what she should do next. Reason for Disposition  Taking Coumadin (warfarin) or other strong blood thinner, or known bleeding disorder (e.g., thrombocytopenia)  Answer Assessment - Initial Assessment Questions 1. COLOR of URINE: Describe the color of the urine.  (e.g., tea-colored, pink, red, bloody) Do you have blood clots in your urine? (e.g., none, pea, grape, small coin)     Red, Bloody  2. ONSET: When did the bleeding start?      A Week ago  3. EPISODES: How many times has there been blood in the urine? or How many times today?     Twice  4. PAIN with URINATION: Is there any pain with passing your urine? If Yes, ask: How bad is the pain?  (Scale 1-10; or mild, moderate, severe)    - MILD: Complains slightly about urination hurting.    - MODERATE: Interferes with normal activities.      - SEVERE: Excruciating, unwilling or unable to urinate because of the pain.      Mild  5. FEVER: Do you have a fever? If Yes, ask: What is your temperature, how was it measured, and when did it start?     No  6. ASSOCIATED SYMPTOMS: Are you passing urine more frequently than usual?     Frequency  7. OTHER SYMPTOMS: Do you have any other symptoms? (e.g., back/flank  pain, abdomen pain, vomiting)     Pain (Under Breast)  8. PREGNANCY: Is there any chance you are pregnant? When was your last menstrual period?      No and No  Patient is opting  to go to the Gynecologist instead, despite offering an appointment.  Protocols used: Urine - Blood In-A-AH

## 2023-08-03 ENCOUNTER — Telehealth: Payer: Self-pay

## 2023-08-03 ENCOUNTER — Other Ambulatory Visit (INDEPENDENT_AMBULATORY_CARE_PROVIDER_SITE_OTHER)

## 2023-08-03 DIAGNOSIS — Z0184 Encounter for antibody response examination: Secondary | ICD-10-CM

## 2023-08-03 DIAGNOSIS — R310 Gross hematuria: Secondary | ICD-10-CM | POA: Diagnosis not present

## 2023-08-03 DIAGNOSIS — Z87442 Personal history of urinary calculi: Secondary | ICD-10-CM | POA: Diagnosis not present

## 2023-08-03 DIAGNOSIS — R1032 Left lower quadrant pain: Secondary | ICD-10-CM | POA: Diagnosis not present

## 2023-08-03 NOTE — Telephone Encounter (Signed)
 Copied from CRM [id number]. Topic: Appointments - Scheduling Inquiry for Clinic >> Aug 02, 2023  4:50 PM DeAngela L wrote: Reason for CRM: calling to inform the office she has a urology appt with Dr Carolee scheduled tomorrow office [phone] Pt num [phone] (M)

## 2023-08-04 LAB — MEASLES/MUMPS/RUBELLA IMMUNITY
Mumps IgG: >300 [AU]/ml
Rubella: 25.1 {index}
Rubeola IgG: >300 [AU]/ml

## 2023-08-05 ENCOUNTER — Other Ambulatory Visit: Payer: Self-pay | Admitting: Internal Medicine

## 2023-08-05 NOTE — Telephone Encounter (Signed)
 Copied from CRM [id number]. Topic: Clinical - Medical Advice >> Aug 05, 2023 12:02 PM Turkey A wrote: Reason for CRM: Patient would like for Tobias to call her back regarding visit with Urologist

## 2023-08-08 ENCOUNTER — Ambulatory Visit: Payer: Self-pay | Admitting: Internal Medicine

## 2023-08-09 ENCOUNTER — Telehealth: Payer: Self-pay

## 2023-08-09 ENCOUNTER — Other Ambulatory Visit: Payer: Self-pay | Admitting: Internal Medicine

## 2023-08-09 NOTE — Telephone Encounter (Signed)
 Copied from CRM [id number]. Topic: Clinical - Medication Refill >> Aug 09, 2023 10:17 AM Donna BRAVO wrote: Medication:  Oxcarbazepine  (TRILEPTAL ) 300 MG tablet   Has the patient contacted their pharmacy? Yes Pharmacy stated there are no refills on this medication   This is the patient's preferred pharmacy:   [facility] [address], [place] - [address]  [place] [address] Phone: [phone] Fax: [phone]   Is this the correct pharmacy for this prescription? Yes If no, delete pharmacy and type the correct one.   Has the prescription been filled recently? Yes  Is the patient out of the medication? Yes  Has the patient been seen for an appointment in the last year OR does the patient have an upcoming appointment? Yes  Can we respond through MyChart? No  Agent: Please be advised that Rx refills may take up to 3 business days. We ask that you follow-up with your pharmacy.

## 2023-08-09 NOTE — Telephone Encounter (Signed)
 Copied from CRM [id number]. Topic: Clinical - Lab/Test Results >> Aug 09, 2023 10:23 AM Donna BRAVO wrote: Reason for CRM: patient calling asking for lab result drawn on 08/03/23 Read verbatim lab result   Glade JINNY Hope, MD 08/08/2023  2:49 PM EDT    she is immune to measles, mumps and rubella.   Patient would like to know if she needs these injections Patient [phone]

## 2023-08-09 NOTE — Telephone Encounter (Signed)
 Spoke with patient today.

## 2023-08-13 ENCOUNTER — Inpatient Hospital Stay: Payer: Medicare PPO | Admitting: Hematology and Oncology

## 2023-08-13 ENCOUNTER — Inpatient Hospital Stay: Payer: Medicare PPO | Attending: Hematology and Oncology

## 2023-08-13 ENCOUNTER — Other Ambulatory Visit: Payer: Self-pay | Admitting: Hematology and Oncology

## 2023-08-13 VITALS — BP 125/70 | HR 85 | Temp 97.1°F | Resp 16 | Wt 315.0 lb

## 2023-08-13 DIAGNOSIS — Z79899 Other long term (current) drug therapy: Secondary | ICD-10-CM | POA: Insufficient documentation

## 2023-08-13 DIAGNOSIS — Z8042 Family history of malignant neoplasm of prostate: Secondary | ICD-10-CM | POA: Diagnosis not present

## 2023-08-13 DIAGNOSIS — Z86718 Personal history of other venous thrombosis and embolism: Secondary | ICD-10-CM

## 2023-08-13 DIAGNOSIS — Z86711 Personal history of pulmonary embolism: Secondary | ICD-10-CM | POA: Diagnosis not present

## 2023-08-13 DIAGNOSIS — R609 Edema, unspecified: Secondary | ICD-10-CM | POA: Insufficient documentation

## 2023-08-13 DIAGNOSIS — R600 Localized edema: Secondary | ICD-10-CM | POA: Diagnosis not present

## 2023-08-13 DIAGNOSIS — Z7901 Long term (current) use of anticoagulants: Secondary | ICD-10-CM | POA: Insufficient documentation

## 2023-08-13 DIAGNOSIS — Z862 Personal history of diseases of the blood and blood-forming organs and certain disorders involving the immune mechanism: Secondary | ICD-10-CM | POA: Insufficient documentation

## 2023-08-13 DIAGNOSIS — Z803 Family history of malignant neoplasm of breast: Secondary | ICD-10-CM | POA: Insufficient documentation

## 2023-08-13 DIAGNOSIS — Z800 Family history of malignant neoplasm of digestive organs: Secondary | ICD-10-CM | POA: Diagnosis not present

## 2023-08-13 DIAGNOSIS — D696 Thrombocytopenia, unspecified: Secondary | ICD-10-CM | POA: Insufficient documentation

## 2023-08-13 LAB — CMP (CANCER CENTER ONLY)
ALT: 19 U/L (ref 0–44)
AST: 21 U/L (ref 15–41)
Albumin: 3.7 g/dL (ref 3.5–5.0)
Alkaline Phosphatase: 100 U/L (ref 38–126)
Anion gap: 5 (ref 5–15)
BUN: 22 mg/dL (ref 8–23)
CO2: 35 mmol/L — ABNORMAL HIGH (ref 22–32)
Calcium: 9.5 mg/dL (ref 8.9–10.3)
Chloride: 100 mmol/L (ref 98–111)
Creatinine: 2.03 mg/dL — ABNORMAL HIGH (ref 0.44–1.00)
GFR, Estimated: 25 mL/min — ABNORMAL LOW (ref >60)
Glucose, Bld: 98 mg/dL (ref 70–99)
Potassium: 4 mmol/L (ref 3.5–5.1)
Sodium: 140 mmol/L (ref 135–145)
Total Bilirubin: 0.6 mg/dL (ref 0.0–1.2)
Total Protein: 6.9 g/dL (ref 6.5–8.1)

## 2023-08-13 LAB — CBC WITH DIFFERENTIAL (CANCER CENTER ONLY)
Abs Immature Granulocytes: 0.04 K/uL (ref 0.00–0.07)
Basophils Absolute: 0.1 K/uL (ref 0.0–0.1)
Basophils Relative: 1 %
Eosinophils Absolute: 0.1 K/uL (ref 0.0–0.5)
Eosinophils Relative: 2 %
HCT: 39.2 % (ref 36.0–46.0)
Hemoglobin: 12.7 g/dL (ref 12.0–15.0)
Immature Granulocytes: 1 %
Lymphocytes Relative: 22 %
Lymphs Abs: 1.5 K/uL (ref 0.7–4.0)
MCH: 31.1 pg (ref 26.0–34.0)
MCHC: 32.4 g/dL (ref 30.0–36.0)
MCV: 96.1 fL (ref 80.0–100.0)
Monocytes Absolute: 0.6 K/uL (ref 0.1–1.0)
Monocytes Relative: 8 %
Neutro Abs: 4.7 K/uL (ref 1.7–7.7)
Neutrophils Relative %: 66 %
Platelet Count: 141 K/uL — ABNORMAL LOW (ref 150–400)
RBC: 4.08 MIL/uL (ref 3.87–5.11)
RDW: 16 % — ABNORMAL HIGH (ref 11.5–15.5)
WBC Count: 7 K/uL (ref 4.0–10.5)
nRBC: 0.4 % — ABNORMAL HIGH (ref 0.0–0.2)

## 2023-08-13 NOTE — Progress Notes (Unsigned)
 [facility] [phone]   [phone]  PROGRESS NOTE  Patient Care Team: Geofm Glade PARAS, MD as PCP - General (Internal Medicine)  Hematological/Oncological History 1) 05/08/2020: Presented with leg pain with difficulty walking x 10 days. Doppler US  confirmed age indeterminate DVT involving right posterior tibial veins and left posterior tibial veins. CTA was negative for pulmonary embolism. Started on Eliquis .   2) 10/02/2020: Establish care with [employer] PA-C  CHIEF COMPLAINTS/PURPOSE OF CONSULTATION:  Bilateral lower extremity DVT  HISTORY OF PRESENTING ILLNESS:  April [name] 73 y.o. female returns for follow-up for history of bilateral lower extremity DVT.  Patient was last seen by me on 08/12/2022. In the interim, she has continues on eliquis  therapy.    On exam today, Mrs. Etzkorn reports she continues to take her Eliquis  2.5 mg twice daily as prescribed.  She reports that she had to be off a few days when she had some blood in the urine.  She notes that she restarted again recently.  She is under the care of a urologist.  She notes that she is doing her best to try to drink 2-3 bottles of water per day.  She is doing her best to try to stay hydrated.  She notes that she pays about $47.50 a month for her Eliquis  prescription.  She has no signs or symptoms concerning for clot such as leg pain leg swelling, chest pain, but she does have chronic shortness of breath.  She reports that she has otherwise been at her baseline level of health.  She denies any fevers, chills, sweats.  Full 10 point ROS is otherwise negative.  Overall she is willing and able to proceed with Eliquis  therapy at this time.  MEDICAL HISTORY:  Past Medical History:  Diagnosis Date   Anemia    Asthma    CAD (coronary artery disease)    Carpal tunnel syndrome    Cellulitis of both lower extremities 04/11/2015   CHF (congestive heart failure) (HCC)    Chronic kidney disease     [facility]- followed by [facility]   Colon polyp, hyperplastic 2007 & 2012   Complication of anesthesia 1999   svt with renal calculi surgery, no problems since   CTS (carpal tunnel syndrome)    bilateral   Diabetes mellitus    Eczema    GERD (gastroesophageal reflux disease)    History of kidney stones 1999   Hyperlipidemia    Hypertension    Leg ulcer (HCC) 04/24/2015   Right lateral leg No evidence of an infection Monitor closely Keep edema controlled    Leg ulcer (HCC)    right lower   Meralgia paresthetica    Dr. Onita   Morbid obesity (HCC)    Neuropathy    toes and legs   Osteoarthrosis, unspecified whether generalized or localized, lower leg    knee   PUD (peptic ulcer disease)    Shortness of breath dyspnea    with exertion   Sleep apnea    per progress note 02/25/2018   Type II or unspecified type diabetes mellitus without mention of complication, not stated as uncontrolled    Unspecified hereditary and idiopathic peripheral neuropathy    Urticaria    Vitamin B12 deficiency    Wears glasses    Wound cellulitis    right upper leg, healing well    SURGICAL HISTORY: Past Surgical History:  Procedure Laterality Date   ABDOMINAL HYSTERECTOMY     BIOPSY  03/20/2021   Procedure: BIOPSY;  Surgeon: Albertus Gordy HERO, MD;  Location: [facility];  Service: Gastroenterology;;   BREAST BIOPSY Right 07/08/2018   CARDIAC CATHETERIZATION  2002   non obstructive disease   colonoscopy with polypectomy  2007 & 2012    hyperplastic ;Dr Rosalie   COLONOSCOPY WITH PROPOFOL  N/A 06/04/2015   Procedure: COLONOSCOPY WITH PROPOFOL ;  Surgeon: Gordy HERO Albertus, MD;  Location: [facility];  Service: Gastroenterology;  Laterality: N/A;   COLONOSCOPY WITH PROPOFOL  N/A 03/20/2021   Procedure: COLONOSCOPY WITH PROPOFOL ;  Surgeon: Albertus Gordy HERO, MD;  Location: [facility];  Service: Gastroenterology;  Laterality: N/A;   DEBRIDEMENT LEG Right 03/02/2018   WOUND VAC APPLIED   DEBRIDEMENT LEG Right  05/27/2018   RIGHT LOWER LEG DEBRIDEMENT,  SKIN GRAFT, VAC PLACEMENT    DILATION AND CURETTAGE OF UTERUS     multiple   ESOPHAGOGASTRODUODENOSCOPY (EGD) WITH PROPOFOL  N/A 03/20/2021   Procedure: ESOPHAGOGASTRODUODENOSCOPY (EGD) WITH PROPOFOL ;  Surgeon: Albertus Gordy HERO, MD;  Location: [facility];  Service: Gastroenterology;  Laterality: N/A;   HEMORRHOID SURGERY     HEMORROIDECTOMY     I & D EXTREMITY Right 03/02/2018   Procedure: RIGHT LEG DEBRIDEMENT AND PLACE VAC;  Surgeon: Harden Jerona GAILS, MD;  Location: [facility];  Service: Orthopedics;  Laterality: Right;   I & D EXTREMITY Right 03/04/2018   Procedure: REPEAT IRRIGATION AND DEBRIDEMENT RIGHT LEG, PLACE WOUND VAC;  Surgeon: Harden Jerona GAILS, MD;  Location: [facility];  Service: Orthopedics;  Laterality: Right;   I & D EXTREMITY Right 03/30/2018   Procedure: IRRIGATION AND DEBRIDEMENT RIGHT LEG, APPLY WOUND VAC;  Surgeon: Harden Jerona GAILS, MD;  Location: [facility];  Service: Orthopedics;  Laterality: Right;   I & D EXTREMITY Right 05/27/2018   Procedure: RIGHT LOWER LEG DEBRIDEMENT,  SKIN GRAFT, VAC PLACEMENT;  Surgeon: Harden Jerona GAILS, MD;  Location: [facility];  Service: Orthopedics;  Laterality: Right;  RIGHT LOWER LEG DEBRIDEMENT,  SKIN GRAFT, VAC PLACEMENT   POLYPECTOMY  03/20/2021   Procedure: POLYPECTOMY;  Surgeon: Albertus Gordy HERO, MD;  Location: [facility];  Service: Gastroenterology;;   renal calculi  12/1997   SVT with induction of anesthesia   RIGHT HEART CATH N/A 11/19/2020   Procedure: RIGHT HEART CATH;  Surgeon: Cherrie Toribio SAUNDERS, MD;  [facility];  Service: Cardiovascular;  Laterality: N/A;   right knee arthroscopy     SKIN SPLIT GRAFT Right 03/04/2018   Procedure: POSSIBLE SPLIT THICKNESS SKIN GRAFT;  Surgeon: Harden Jerona GAILS, MD;  [facility]: [facility] OR;  Service: Orthopedics;  Laterality: Right;   SKIN SPLIT GRAFT Right 04/01/2018   Procedure: REPEAT IRRIGATION AND DEBRIDEMENT RIGHT LEG, APPLY SPLIT THICKNESS SKIN GRAFT;  Surgeon:  Harden Jerona GAILS, MD;  Location: [facility];  Service: Orthopedics;  Laterality: Right;   TONSILLECTOMY AND ADENOIDECTOMY      SOCIAL HISTORY: Social History   Socioeconomic History   Marital status: Married    Spouse name: Dwight   Number of children: 1   Years of education: BS   Highest education level: Not on file  Occupational History   Occupation: Disabled    Employer: RETIRED  Tobacco Use   Smoking status: Never    Passive exposure: Never   Smokeless tobacco: Never  Vaping Use   Vaping status: Never Used  Substance and Sexual Activity   Alcohol use: No    Alcohol/week: 0.0 standard drinks of alcohol   Drug use: No   Sexual  activity: Not Currently  Other Topics Concern   Not on file  Social History Narrative   Patient lives at home with her husband Wilton) . Patient is retired and has a Lawyer.    Caffeine - some times.   Right handed.   Social Drivers of Health   Financial Resource Strain: Medium Risk (06/16/2023)   Overall Financial Resource Strain (CARDIA)    Difficulty of Paying Living Expenses: Somewhat hard  Food Insecurity: No Food Insecurity (06/16/2023)   Hunger Vital Sign    Worried About Running Out of Food in the Last Year: Never true    Ran Out of Food in the Last Year: Never true  Transportation Needs: No Transportation Needs (06/16/2023)   PRAPARE - Administrator, Civil Service (Medical): No    Lack of Transportation (Non-Medical): No  Physical Activity: Inactive (06/16/2023)   Exercise Vital Sign    Days of Exercise per Week: 0 days    Minutes of Exercise per Session: 0 min  Stress: Stress Concern Present (06/16/2023)   [employer] of Occupational Health - Occupational Stress Questionnaire    Feeling of Stress : To some extent  Social Connections: Moderately Isolated (06/16/2023)   Social Connection and Isolation Panel    Frequency of Communication with Friends and Family: More than three times a week    Frequency of  Social Gatherings with Friends and Family: Never    Attends Religious Services: Never    Database administrator or Organizations: No    Attends Banker Meetings: Never    Marital Status: Married  Catering manager Violence: Not At Risk (06/16/2023)   Humiliation, Afraid, Rape, and Kick questionnaire    Fear of Current or Ex-Partner: No    Emotionally Abused: No    Physically Abused: No    Sexually Abused: No    FAMILY HISTORY: Family History  Problem Relation Age of Onset   Colon cancer Mother    Prostate cancer Father    Colon cancer Father    Diabetes Maternal Aunt    Breast cancer Maternal Aunt    Diabetes Maternal Uncle    Diabetes Paternal Aunt    Stroke Paternal Aunt        > 65   Heart disease Paternal Aunt    Diabetes Paternal Uncle    Breast cancer Maternal Aunt         X 2   Breast cancer Cousin     ALLERGIES:  is allergic to penicillins, shellfish allergy, sulfa antibiotics, sulfonamide derivatives, hydrochlorothiazide-triamterene, lotensin [benazepril hcl], dipyridamole, estrogens, hydrochlorothiazide, latex, metronidazole, other, spironolactone , torsemide , valsartan, doxycycline , and mustard [allyl isothiocyanate].  MEDICATIONS:  Current Outpatient Medications  Medication Sig Dispense Refill   acetaminophen  (TYLENOL ) 650 MG CR tablet Take 1,300 mg by mouth every 8 (eight) hours as needed for pain.     Alcohol Swabs (DROPSAFE ALCOHOL PREP) 70 % PADS USE TOPICALLY AS DIRECTED 300 each 3   allopurinol  (ZYLOPRIM ) 100 MG tablet Take 1 tablet (100 mg total) by mouth 2 (two) times daily. TAKE 2 TABLETS(200 MG) BY MOUTH DAILY 180 tablet 1   atorvastatin  (LIPITOR) 40 MG tablet Take 1 tablet (40 mg total) by mouth daily. 90 tablet 2   azelastine  (ASTELIN ) 0.1 % nasal spray Place 2 sprays into both nostrils 2 (two) times daily. Use in each nostril as directed 30 mL 5   benzonatate  (TESSALON ) 200 MG capsule Take 1 capsule (200 mg total) by mouth 2 (  two) times  daily as needed for cough. 60 capsule 1   Blood Glucose Calibration (TRUE METRIX LEVEL 1) Low SOLN UAD  E11.9 1 each 3   Blood Glucose Monitoring Suppl (TRUE METRIX METER) DEVI True Metrix Level 1 solution     budesonide -glycopyrrolate -formoterol  (BREZTRI  AEROSPHERE) 160-9-4.8 MCG/ACT AERO inhaler Inhale 2 puffs into the lungs in the morning and at bedtime. 3 each 3   Calcium  Carb-Cholecalciferol  (OYSTER SHELL CALCIUM  250+D) 250-3.125 MG-MCG TABS Take 1 tablet by mouth daily at 2 PM.     calcium  carbonate (TUMS - DOSED IN MG ELEMENTAL CALCIUM ) 500 MG chewable tablet Chew 2 tablets by mouth daily as needed for indigestion or heartburn.     cyanocobalamin  (,VITAMIN B-12,) 1000 MCG/ML injection INJECT INTO MUSCLE EVERY 30 DAYS 10 mL 5   dapagliflozin  propanediol (FARXIGA ) 10 MG TABS tablet Take 10 mg by mouth daily.     diclofenac  Sodium (VOLTAREN ) 1 % GEL Apply 1 application topically as needed (pain).     diphenhydrAMINE  (BENADRYL ) 12.5 MG/5ML liquid Take 25 mg by mouth 4 (four) times daily as needed for allergies.     ELIQUIS  2.5 MG TABS tablet TAKE 1 TABLET BY MOUTH TWICE DAILY 180 tablet 1   famotidine  (PEPCID ) 40 MG tablet Take 1 tablet (40 mg total) by mouth 2 (two) times daily. 180 tablet 3   FEROSUL 325 (65 Fe) MG tablet TAKE 1 TABLET BY MOUTH DAILY ON MONDAY, WEDNESDAY AND FRIDAY 60 tablet 0   fluconazole (DIFLUCAN) 150 MG tablet Take 150 mg by mouth every 3 (three) days.     fluticasone  (FLONASE ) 50 MCG/ACT nasal spray Place 2 sprays into both nostrils daily. 18.2 mL 2   gabapentin  (NEURONTIN ) 100 MG capsule TAKE 1 CAPSULE(100 MG) BY MOUTH THREE TIMES DAILY 90 capsule 1   hydrOXYzine  (ATARAX ) 25 MG tablet Take 1 tablet (25 mg total) by mouth 3 (three) times daily as needed for itching. 90 tablet 1   metroNIDAZOLE (FLAGYL) 500 MG tablet Take 500 mg by mouth 2 (two) times daily.     mometasone  (ELOCON ) 0.1 % cream Apply topically 2 (two) times daily.     montelukast  (SINGULAIR ) 10 MG  tablet TAKE 1 TABLET(10 MG) BY MOUTH AT BEDTIME 30 tablet 5   nebivolol  (BYSTOLIC ) 5 MG tablet TAKE 1 TABLET(5 MG) BY MOUTH DAILY 90 tablet 1   nystatin  ointment (MYCOSTATIN ) SMARTSIG:2 Topical Twice Daily     Oxcarbazepine  (TRILEPTAL ) 300 MG tablet TAKE 1 TABLET(300 MG) BY MOUTH TWICE DAILY 60 tablet 0   polyethylene glycol (MIRALAX  / GLYCOLAX ) 17 g packet Take 17 g by mouth daily as needed for moderate constipation.     potassium chloride  SA (KLOR-CON  M) 20 MEQ tablet TAKE 1 TABLET BY MOUTH DAILY AND 3 TABLETS BY MOUTH EVERY WEDNESDAY 102 tablet 3   Spacer/Aero-Holding Raguel FRENCH Use with inhaler. 1 each 2   spironolactone  (ALDACTONE ) 25 MG tablet TAKE 1 TABLET(25 MG) BY MOUTH DAILY 90 tablet 1   tirzepatide  (MOUNJARO ) 15 MG/0.5ML Pen INJECT 15MG  (1 PEN) UNDER THE SKIN EVERY WEEK 6 mL 3   torsemide  (DEMADEX ) 20 MG tablet TAKE 2 TABLETS(40 MG) BY MOUTH TWICE DAILY 360 tablet 3   triamcinolone  ointment (KENALOG ) 0.5 % Apply 1 Application topically as needed (knee pain).     UNABLE TO FIND Med Name: Oxygen  portable at home 2-liters as needed     VENTOLIN  HFA 108 (90 Base) MCG/ACT inhaler Inhale 2 puffs into the lungs every 4 (four) hours  as needed for wheezing.     No current facility-administered medications for this visit.    REVIEW OF SYSTEMS:   Constitutional: ( - ) fevers, ( - )  chills , ( - ) night sweats Eyes: ( - ) blurriness of vision, ( - ) double vision, ( - ) watery eyes Ears, nose, mouth, throat, and face: ( - ) mucositis, ( - ) sore throat Respiratory: ( - ) cough, (+) dyspnea, ( - ) wheezes Cardiovascular: ( - ) palpitation, ( - ) chest discomfort, ( +) lower extremity swelling Gastrointestinal:  ( - ) nausea, ( - ) heartburn, ( - ) change in bowel habits Skin: ( - ) abnormal skin rashes Lymphatics: ( - ) new lymphadenopathy, ( - ) easy bruising Neurological: ( + ) numbness, ( - ) tingling, ( - ) new weaknesses Behavioral/Psych: ( - ) mood change, ( - ) new changes  All  other systems were reviewed with the patient and are negative.  PHYSICAL EXAMINATION: ECOG PERFORMANCE STATUS: 2 - Symptomatic, <50% confined to bed  Vitals:   08/13/23 1007  BP: 125/70  Pulse: 85  Resp: 16  Temp: (!) 97.1 F (36.2 C)  SpO2: 94%    GENERAL: well appearing female in NAD, obese and in a wheelchair for the visit.  SKIN: skin color, texture, turgor are normal, no rashes or significant lesions EYES: conjunctiva are pink and non-injected, sclera clear LUNGS: clear to auscultation and percussion with normal breathing effort HEART: regular rate & rhythm and no murmurs. Bilateral lower extremity edema, left greater than right.  Musculoskeletal: no cyanosis of digits and no clubbing  PSYCH: alert & oriented x 3, fluent speech NEURO: no focal motor/sensory deficits  LABORATORY DATA:  I have reviewed the data as listed    Latest Ref Rng & Units 08/13/2023    9:01 AM 07/09/2023   12:10 PM 01/08/2023   11:44 AM  CBC  WBC 4.0 - 10.5 K/uL 7.0  6.0  6.7   Hemoglobin 12.0 - 15.0 g/dL 87.2  86.9  86.1   Hematocrit 36.0 - 46.0 % 39.2  40.1  40.7   Platelets 150 - 400 K/uL 141  160.0  182.0        Latest Ref Rng & Units 08/13/2023    9:01 AM 07/09/2023   12:10 PM 01/08/2023   11:44 AM  CMP  Glucose 70 - 99 mg/dL 98  92  84   BUN 8 - 23 mg/dL 22  27  20    Creatinine 0.44 - 1.00 mg/dL 7.96  7.94  8.22   Sodium 135 - 145 mmol/L 140  138  137   Potassium 3.5 - 5.1 mmol/L 4.0  4.7  4.2   Chloride 98 - 111 mmol/L 100  96  97   CO2 22 - 32 mmol/L 35  36  34   Calcium  8.9 - 10.3 mg/dL 9.5  9.6  9.2   Total Protein 6.5 - 8.1 g/dL 6.9  7.2  7.1   Total Bilirubin 0.0 - 1.2 mg/dL 0.6  0.6  0.6   Alkaline Phos 38 - 126 U/L 100  88  113   AST 15 - 41 U/L 21  17  18    ALT 0 - 44 U/L 19  15  13     RADIOGRAPHIC STUDIES: I have personally reviewed the radiological images as listed and agreed with the findings in the report.  05/07/2020: CTA chest: Slightly suboptimal opacification of  the main  pulmonary artery however no central or proximal segmental pulmonary embolism. Mild cardiomegaly in findings suggestive of pulmonary arterial Hypertension. Minimal ground-glass opacity at both lung bases which may be due to Atelectasis. Small hiatal hernia. Aortic Atherosclerosis.  05/08/2020: Doppler US  Lower Extremity: Findings consistent with age-indeterminate deep vein thrombosis involving the right posterior tibial vein and the left posterior tibial veins.  ASSESSMENT & PLAN Robynne Page Cuneo is a 73 y.o. female returns for a follow up for bilateral lower extremity edema, currently on Eliquis .    #Bilateral lower extremity DVT involving the posterior tibial veins: -- Currently on Eliquis  2.5 mg twice daily with good tolerance. If patient is unable to afford Eliquis , we will consider switching her to coumadin.  -- Risk factors include sedentary lifestyle and obesity.  Given no definitive provoking factor and unmodifiable risk factors, recommend indefinite anticoagulation. -- No evidence of antiphospholipid syndrome based on laboratory evaluation from 10/02/2020. --Labs from today were reviewed and require no intervention. No cytopenias. Creatinine stable.  --RTC in 6 months with labs prior.   #Left > right lower extremity edema: --Encouraged to wear compression stockings.  -- Continue to monitor  #Thrombocytopenia--stable --No evidence of nutritional deficiencies based on labs form 11/19/2020.  --HIV serology from 11/15/2020 was nonreative --No evidence of hepatitis B, hepatitis C based on labs from 12/31/2020.  --Platelet count is 141 today. No further workup is needed  #Mild normocytic anemia--resolved: --Likely secondary to chronic disease including CKD.  --No evidence of vitamin B12, iron or folate deficiencies based on labs from 11/19/2020. There is adequate reticulocytes.  --Hgb is normal today at 12.7. Monitor for now.   No orders of the defined types were placed in  this encounter.    All questions were answered. The patient knows to call the clinic with any problems, questions or concerns.  I have spent a total of 25 minutes minutes of face-to-face and non-face-to-face time, preparing to see the patient, obtaining and/or reviewing separately obtained history, performing a medically appropriate examination, counseling and educating the patient, ordering tests, documenting clinical information in the electronic health record,and care coordination.    Norleen IVAR Kidney, MD Department of Hematology/Oncology [facility] Phone: [phone] Pager: [phone] Email: [email or web address]

## 2023-08-16 DIAGNOSIS — I5033 Acute on chronic diastolic (congestive) heart failure: Secondary | ICD-10-CM | POA: Diagnosis not present

## 2023-08-16 DIAGNOSIS — J9602 Acute respiratory failure with hypercapnia: Secondary | ICD-10-CM | POA: Diagnosis not present

## 2023-08-16 DIAGNOSIS — J9601 Acute respiratory failure with hypoxia: Secondary | ICD-10-CM | POA: Diagnosis not present

## 2023-08-17 ENCOUNTER — Ambulatory Visit: Payer: Self-pay | Admitting: *Deleted

## 2023-08-17 ENCOUNTER — Other Ambulatory Visit (HOSPITAL_COMMUNITY): Payer: Self-pay | Admitting: Urology

## 2023-08-17 DIAGNOSIS — Z87442 Personal history of urinary calculi: Secondary | ICD-10-CM

## 2023-08-17 DIAGNOSIS — R310 Gross hematuria: Secondary | ICD-10-CM

## 2023-08-17 DIAGNOSIS — R1032 Left lower quadrant pain: Secondary | ICD-10-CM

## 2023-08-17 NOTE — Telephone Encounter (Addendum)
 Faxed lab results as requested to Dr. Gearline Fax confirmation received   ----- Message from Nurse Almarie T sent at 08/16/2023  5:15 PM EDT -----  ----- Message ----- From: Federico Norleen ONEIDA MADISON, MD Sent: 08/15/2023   4:07 PM EDT To: Almarie DELENA Arabia, RN  Please print and fax these results to Almarie Gearline with nephrology.  The patient requested that her elevated creatinine be sent to her nephrologist. ----- Message ----- From: Rebecka, Lab In [place] Sent: 08/13/2023   9:13 AM EDT To: Norleen ONEIDA Federico MADISON, MD

## 2023-08-20 DIAGNOSIS — M1712 Unilateral primary osteoarthritis, left knee: Secondary | ICD-10-CM | POA: Diagnosis not present

## 2023-08-25 ENCOUNTER — Ambulatory Visit (HOSPITAL_COMMUNITY)
Admission: RE | Admit: 2023-08-25 | Discharge: 2023-08-25 | Disposition: A | Discharge status: Home / Self Care | Source: Ambulatory Visit | Attending: Urology | Admitting: Urology

## 2023-08-25 DIAGNOSIS — Z87442 Personal history of urinary calculi: Secondary | ICD-10-CM | POA: Insufficient documentation

## 2023-08-25 DIAGNOSIS — R319 Hematuria, unspecified: Secondary | ICD-10-CM | POA: Diagnosis not present

## 2023-08-25 DIAGNOSIS — R1032 Left lower quadrant pain: Secondary | ICD-10-CM | POA: Diagnosis not present

## 2023-08-25 DIAGNOSIS — R310 Gross hematuria: Secondary | ICD-10-CM | POA: Insufficient documentation

## 2023-08-25 DIAGNOSIS — N200 Calculus of kidney: Secondary | ICD-10-CM | POA: Diagnosis not present

## 2023-08-25 DIAGNOSIS — R109 Unspecified abdominal pain: Secondary | ICD-10-CM | POA: Diagnosis not present

## 2023-08-27 ENCOUNTER — Ambulatory Visit

## 2023-08-27 ENCOUNTER — Ambulatory Visit
Admission: RE | Admit: 2023-08-27 | Discharge: 2023-08-27 | Disposition: A | Discharge status: Home / Self Care | Payer: Medicare PPO | Source: Ambulatory Visit | Attending: Internal Medicine | Admitting: Internal Medicine

## 2023-08-27 ENCOUNTER — Ambulatory Visit (INDEPENDENT_AMBULATORY_CARE_PROVIDER_SITE_OTHER)

## 2023-08-27 DIAGNOSIS — Z1231 Encounter for screening mammogram for malignant neoplasm of breast: Secondary | ICD-10-CM

## 2023-08-27 DIAGNOSIS — E538 Deficiency of other specified B group vitamins: Secondary | ICD-10-CM

## 2023-08-27 MED ORDER — CYANOCOBALAMIN 1000 MCG/ML IJ SOLN
1000.0000 ug | Freq: Once | INTRAMUSCULAR | Status: AC
  Administered 2023-08-27: 1000 ug via INTRAMUSCULAR

## 2023-08-27 NOTE — Progress Notes (Signed)
Pt here for monthly B12 injection per Dr. Lawerance Bach  B12 given IM and pt tolerated injection well.

## 2023-08-30 ENCOUNTER — Other Ambulatory Visit: Payer: Self-pay | Admitting: Internal Medicine

## 2023-08-31 ENCOUNTER — Ambulatory Visit (INDEPENDENT_AMBULATORY_CARE_PROVIDER_SITE_OTHER): Admitting: Podiatry

## 2023-08-31 ENCOUNTER — Encounter: Payer: Self-pay | Admitting: Podiatry

## 2023-08-31 DIAGNOSIS — M79675 Pain in left toe(s): Secondary | ICD-10-CM | POA: Diagnosis not present

## 2023-08-31 DIAGNOSIS — M79674 Pain in right toe(s): Secondary | ICD-10-CM

## 2023-08-31 DIAGNOSIS — L84 Corns and callosities: Secondary | ICD-10-CM | POA: Diagnosis not present

## 2023-08-31 DIAGNOSIS — B351 Tinea unguium: Secondary | ICD-10-CM | POA: Diagnosis not present

## 2023-08-31 DIAGNOSIS — Z7901 Long term (current) use of anticoagulants: Secondary | ICD-10-CM | POA: Diagnosis not present

## 2023-08-31 NOTE — Progress Notes (Unsigned)
 Subjective: Chief Complaint  Patient presents with   Diabetes    [facility] diet control diabetes. Toenail trim.     73 y.o. returns the office today for painful, elongated, thickened toenails which she cannot trim herself. Denies any redness or drainage around the nails.  She has a callus on the left heel which causes discomfort at times.  No open lesions.  No new pedal complaints today.  On Eliquis    PCP: April Glade PARAS, MD Last Seen: 07/09/2023  A1c: 5.7 on 12/6/024  Objective: AAO 3, NAD- in wheelchair DP/PT pulses decreased- feet are warm and perfused.  Chronic lower extremity edema present.  Sensation decreased bilaterally.  Nails hypertrophic, dystrophic, elongated, brittle, discolored 10. There is tenderness overlying the nails 1-5 bilaterally. There is no surrounding erythema or drainage along the nail sites. Hyperkeratotic lesion left plantar heel without any underlying ulceration, drainage or signs of infection. No open lesions noted bilaterally. No pain with calf compression, swelling, warmth, erythema.  Assessment: Patient presents with symptomatic onychomycosis, hyperkeratotic lesion on anticoagulation.  Plan: -Treatment options including alternatives, risks, complications were discussed -Nails sharply debrided 10 without complication/bleeding. -Serpe debrided hyperkeratotic lesion x 1 without any complications or bleeding. Moisturizer/offloading daily.  -Discussed daily foot inspection. If there are any changes, to call the office immediately.   Return in about 3 months (around 12/01/2023).   April [name], DPM

## 2023-09-06 ENCOUNTER — Other Ambulatory Visit: Payer: Self-pay | Admitting: Internal Medicine

## 2023-09-10 ENCOUNTER — Ambulatory Visit: Admitting: Nurse Practitioner

## 2023-09-16 ENCOUNTER — Other Ambulatory Visit: Payer: Self-pay | Admitting: Internal Medicine

## 2023-09-16 DIAGNOSIS — J9601 Acute respiratory failure with hypoxia: Secondary | ICD-10-CM | POA: Diagnosis not present

## 2023-09-16 DIAGNOSIS — I5033 Acute on chronic diastolic (congestive) heart failure: Secondary | ICD-10-CM | POA: Diagnosis not present

## 2023-09-16 DIAGNOSIS — J9602 Acute respiratory failure with hypercapnia: Secondary | ICD-10-CM | POA: Diagnosis not present

## 2023-09-16 MED ORDER — HYDROXYZINE HCL 25 MG PO TABS: 25.0000 mg | ORAL_TABLET | Freq: Three times a day (TID) | ORAL | 1 refills | Status: AC | PRN

## 2023-09-16 NOTE — Telephone Encounter (Signed)
 Copied from CRM [id number]. Topic: Clinical - Medication Refill >> Sep 16, 2023 11:02 AM Berneda FALCON wrote: Medication:  hydrOXYzine  (ATARAX ) 25 MG tablet -requesting 90-day supply please  Has the patient contacted their pharmacy? No, they were not open (Agent: If no, request that the patient contact the pharmacy for the refill. If patient does not wish to contact the pharmacy document the reason why and proceed with request.) (Agent: If yes, when and what did the pharmacy advise?)  This is the patient's preferred pharmacy:  [facility] [address] [place] - [address] [place] [address] Phone: [phone] Fax: [phone]  Is this the correct pharmacy for this prescription? Yes If no, delete pharmacy and type the correct one.   Has the prescription been filled recently? No  Is the patient out of the medication? Yes  Has the patient been seen for an appointment in the last year OR does the patient have an upcoming appointment? Yes  Can we respond through MyChart? Yes  Agent: Please be advised that Rx refills may take up to 3 business days. We ask that you follow-up with your pharmacy.

## 2023-09-17 DIAGNOSIS — R310 Gross hematuria: Secondary | ICD-10-CM | POA: Diagnosis not present

## 2023-09-17 DIAGNOSIS — D4102 Neoplasm of uncertain behavior of left kidney: Secondary | ICD-10-CM | POA: Diagnosis not present

## 2023-09-17 DIAGNOSIS — R8289 Other abnormal findings on cytological and histological examination of urine: Secondary | ICD-10-CM | POA: Diagnosis not present

## 2023-09-17 DIAGNOSIS — N3021 Other chronic cystitis with hematuria: Secondary | ICD-10-CM | POA: Diagnosis not present

## 2023-09-17 DIAGNOSIS — N952 Postmenopausal atrophic vaginitis: Secondary | ICD-10-CM | POA: Diagnosis not present

## 2023-09-17 DIAGNOSIS — N200 Calculus of kidney: Secondary | ICD-10-CM | POA: Diagnosis not present

## 2023-09-18 ENCOUNTER — Other Ambulatory Visit: Payer: Self-pay | Admitting: Internal Medicine

## 2023-09-20 ENCOUNTER — Other Ambulatory Visit: Payer: Self-pay | Admitting: Nurse Practitioner

## 2023-09-20 ENCOUNTER — Other Ambulatory Visit: Payer: Self-pay | Admitting: Internal Medicine

## 2023-09-20 ENCOUNTER — Telehealth: Payer: Self-pay | Admitting: Internal Medicine

## 2023-09-20 ENCOUNTER — Encounter: Payer: Self-pay | Admitting: Urology

## 2023-09-20 DIAGNOSIS — J4541 Moderate persistent asthma with (acute) exacerbation: Secondary | ICD-10-CM

## 2023-09-20 NOTE — Telephone Encounter (Unsigned)
 Copied from CRM #[id number]. Topic: Clinical - Medication Refill >> Sep 20, 2023 11:09 AM Shereese L wrote: Medication: Oxcarbazepine  (TRILEPTAL ) 300 MG tablet  Has the patient contacted their pharmacy? Yes (Agent: If no, request that the patient contact the pharmacy for the refill. If patient does not wish to contact the pharmacy document the reason why and proceed with request.) (Agent: If yes, when and what did the pharmacy advise?)  This is the patient's preferred pharmacy:  [facility] [address] [place] - [address] [place] [address] Phone: [phone] Fax: [phone]  Is this the correct pharmacy for this prescription? Yes If no, delete pharmacy and type the correct one.   Has the prescription been filled recently? Yes  Is the patient out of the medication? Yes  Has the patient been seen for an appointment in the last year OR does the patient have an upcoming appointment? Yes  Can we respond through MyChart? Yes  Agent: Please be advised that Rx refills may take up to 3 business days. We ask that you follow-up with your pharmacy.

## 2023-09-20 NOTE — Telephone Encounter (Unsigned)
 Copied from CRM #[id number]. Topic: Clinical - Medication Refill >> Sep 20, 2023 11:02 AM Whitney O wrote: Medication: benzonatate  (TESSALON ) 200 MG capsule  Has the patient contacted their pharmacy? No contacted us  first  (Agent: If no, request that the patient contact the pharmacy for the refill. If patient does not wish to contact the pharmacy document the reason why and proceed with request.) (Agent: If yes, when and what did the pharmacy advise?)  This is the patient's preferred pharmacy:  [id number] [place], [place] - [address] [place] [address] Phone: [phone] Fax: [phone]  [facility] - [place], [place] - [address] [place] [address] Phone: [phone] Fax: [phone]  [facility] - [place], [place] - [address] [place] [address] Phone: [phone] Fax: [phone]  [place] [facility] [address] [place] [address] Phone: [phone] Fax: [phone]  [facility] [address], [place] - [address] [place] [address] Phone: [phone] Fax: [phone]  Is this the correct pharmacy for this prescription? Yes If no, delete pharmacy and type the correct one.  [id number] [place], [place] - [address] [place] [address] Phone: [phone] Fax: [phone]    Has the prescription been filled recently? No  Is the patient out of the medication? Yes  Has the patient been seen for an appointment in the last year OR does the patient have an upcoming appointment? Yes 8/28  Can we respond through MyChart? No  Agent: Please be advised that Rx refills may take up to 3 business days. We ask that you follow-up with your pharmacy.

## 2023-09-20 NOTE — Telephone Encounter (Signed)
 Copied from CRM #[id number]. Topic: Clinical - Medication Question >> Sep 20, 2023  2:39 PM April [name] wrote: Reason for CRM: Pt is calling to check the status of the Oxcarbazepine  refill request. I informed the pt that the medication is currently pending. Pt would like a callback once the medication has been approved and sent to the pharmacy.

## 2023-09-21 ENCOUNTER — Other Ambulatory Visit: Payer: Self-pay | Admitting: Urology

## 2023-09-21 ENCOUNTER — Encounter: Payer: Self-pay | Admitting: Urology

## 2023-09-21 DIAGNOSIS — D4102 Neoplasm of uncertain behavior of left kidney: Secondary | ICD-10-CM

## 2023-09-22 ENCOUNTER — Other Ambulatory Visit (HOSPITAL_BASED_OUTPATIENT_CLINIC_OR_DEPARTMENT_OTHER): Payer: Self-pay

## 2023-09-22 DIAGNOSIS — J4541 Moderate persistent asthma with (acute) exacerbation: Secondary | ICD-10-CM

## 2023-09-22 MED ORDER — BENZONATATE 200 MG PO CAPS: 200.0000 mg | ORAL_CAPSULE | Freq: Two times a day (BID) | ORAL | 2 refills | Status: AC | PRN

## 2023-09-22 NOTE — Telephone Encounter (Signed)
 Ok to refill

## 2023-09-22 NOTE — Telephone Encounter (Signed)
 Please advise if appropriate.

## 2023-09-23 ENCOUNTER — Other Ambulatory Visit (HOSPITAL_COMMUNITY): Payer: Self-pay | Admitting: Urology

## 2023-09-23 ENCOUNTER — Telehealth: Payer: Self-pay | Admitting: Internal Medicine

## 2023-09-23 DIAGNOSIS — D4102 Neoplasm of uncertain behavior of left kidney: Secondary | ICD-10-CM

## 2023-09-23 NOTE — Telephone Encounter (Unsigned)
 Copied from CRM #[id number]. Topic: Clinical - Medication Refill >> Sep 23, 2023  9:35 AM Viola F wrote: Medication: oxyCODONE  (OXY IR/ROXICODONE ) 5 MG immediate release tablet [[id number]]  DISCONTINUED  Has the patient contacted their pharmacy? Yes (Agent: If no, request that the patient contact the pharmacy for the refill. If patient does not wish to contact the pharmacy document the reason why and proceed with request.) (Agent: If yes, when and what did the pharmacy advise?)  This is the patient's preferred pharmacy:  [facility] [address], [place] - [address] [place] [address] Phone: [phone] Fax: [phone]   Is this the correct pharmacy for this prescription? Yes If no, delete pharmacy and type the correct one.   Has the prescription been filled recently? Yes  Is the patient out of the medication? Yes  Has the patient been seen for an appointment in the last year OR does the patient have an upcoming appointment? Yes  Can we respond through MyChart? Yes  Agent: Please be advised that Rx refills may take up to 3 business days. We ask that you follow-up with your pharmacy.

## 2023-09-24 DIAGNOSIS — M1711 Unilateral primary osteoarthritis, right knee: Secondary | ICD-10-CM | POA: Diagnosis not present

## 2023-09-24 MED ORDER — OXYCODONE HCL 5 MG PO TABS: 5.0000 mg | ORAL_TABLET | Freq: Four times a day (QID) | ORAL | 0 refills | Status: DC | PRN

## 2023-09-24 NOTE — Telephone Encounter (Signed)
 Okay for refill?

## 2023-09-30 ENCOUNTER — Encounter: Payer: Self-pay | Admitting: Nurse Practitioner

## 2023-09-30 ENCOUNTER — Ambulatory Visit: Admitting: Nurse Practitioner

## 2023-09-30 VITALS — BP 127/80 | HR 82 | Temp 99.1°F | Ht 62.0 in | Wt 313.0 lb

## 2023-09-30 DIAGNOSIS — J301 Allergic rhinitis due to pollen: Secondary | ICD-10-CM

## 2023-09-30 DIAGNOSIS — J302 Other seasonal allergic rhinitis: Secondary | ICD-10-CM | POA: Diagnosis not present

## 2023-09-30 DIAGNOSIS — J9611 Chronic respiratory failure with hypoxia: Secondary | ICD-10-CM | POA: Diagnosis not present

## 2023-09-30 DIAGNOSIS — N390 Urinary tract infection, site not specified: Secondary | ICD-10-CM

## 2023-09-30 DIAGNOSIS — J4540 Moderate persistent asthma, uncomplicated: Secondary | ICD-10-CM | POA: Diagnosis not present

## 2023-09-30 DIAGNOSIS — J9612 Chronic respiratory failure with hypercapnia: Secondary | ICD-10-CM

## 2023-09-30 NOTE — Patient Instructions (Addendum)
 Continue BiPAP nightly  Use for a minimum 6 hours a night, with goal of using entire night.  Change equipment every 30 days or as directed by DME.  Be aware of reduced alertness and do not drive or operate heavy machinery if experiencing this or drowsiness.  -Continue nighttime supplemental O2 therapy at 1L through BiPAP -Continue 2 lpm supplemental oxygen  on POC as needed with activity. Goal oxygen  >88-90%. Notify of increasing requirements.    -Continue Breztri  2 puffs Twice daily. Brush tongue and rinse mouth afterwards.  -Continue Pulmicort  inhaler 2 puffs twice a day as needed for shortness of breath or wheezing  -Continue Ventolin  inhaler 2 puffs as needed every 4 hours as needed for shortness of breath or wheezing  -Continue Singulair  10 mg At bedtime -Continue Flonase  2 sprays each nostril daily  -Continue Mucinex  600 mg Twice daily for chest congestion -Continue Claritin  10 mg daily for allergies  -Continue benzonatate  1 capsule Three times a day for cough -Continue astelin  nasal spray 2 sprays each nostril Twice daily    Follow up in 6 months with Dr. Neda or Katie Marcie Shearon,NP. If symptoms do not improve or worsen, please contact office for sooner follow up or seek emergency care

## 2023-10-01 ENCOUNTER — Encounter: Payer: Self-pay | Admitting: Nurse Practitioner

## 2023-10-01 ENCOUNTER — Ambulatory Visit

## 2023-10-01 DIAGNOSIS — E538 Deficiency of other specified B group vitamins: Secondary | ICD-10-CM | POA: Diagnosis not present

## 2023-10-01 MED ORDER — CYANOCOBALAMIN 1000 MCG/ML IJ SOLN
1000.0000 ug | Freq: Once | INTRAMUSCULAR | Status: AC
  Administered 2023-10-01: 1000 ug via INTRAMUSCULAR

## 2023-10-01 NOTE — Assessment & Plan Note (Signed)
 Follow up with urology as scheduled.

## 2023-10-01 NOTE — Assessment & Plan Note (Signed)
 No recent exacerbations. Clinically stable and compensated on current regimen. Cough related to upper airway irritation from postnasal drainage.  Lung exam was clear today.  Continue PRN cough control and postnasal drainage.  Continue current maintenance regimen.  Action plan in place.  Patient Instructions  Continue BiPAP nightly  Use for a minimum 6 hours a night, with goal of using entire night.  Change equipment every 30 days or as directed by DME.  Be aware of reduced alertness and do not drive or operate heavy machinery if experiencing this or drowsiness.  -Continue nighttime supplemental O2 therapy at 1L through BiPAP -Continue 2 lpm supplemental oxygen  on POC as needed with activity. Goal oxygen  >88-90%. Notify of increasing requirements.    -Continue Breztri  2 puffs Twice daily. Brush tongue and rinse mouth afterwards.  -Continue Pulmicort  inhaler 2 puffs twice a day as needed for shortness of breath or wheezing  -Continue Ventolin  inhaler 2 puffs as needed every 4 hours as needed for shortness of breath or wheezing  -Continue Singulair  10 mg At bedtime -Continue Flonase  2 sprays each nostril daily  -Continue Mucinex  600 mg Twice daily for chest congestion -Continue Claritin  10 mg daily for allergies  -Continue benzonatate  1 capsule Three times a day for cough -Continue astelin  nasal spray 2 sprays each nostril Twice daily    Follow up in 6 months with Dr. Neda or Katie Cadell Gabrielson,NP. If symptoms do not improve or worsen, please contact office for sooner follow up or seek emergency care

## 2023-10-01 NOTE — Assessment & Plan Note (Signed)
 Stable without increased O2 requirement. Goal >88-90%. Again reviewed importance of compliance with BiPAP therapy and risks of hypercarbia. Verbalized understanding. Encouraged to wear nightly. Safe driving practices reviewed.

## 2023-10-01 NOTE — Progress Notes (Signed)
 @Patient  ID: April [name], female    DOB: Jun 21, 1950, 73 y.o.   MRN: [id number]  Chief Complaint  Patient presents with   Follow-up    F/U of last office visit. Pt states she is not better or worse, just ups and downs Pt is complaining of a little cough - worse at night. Dry cough.     Referring provider: Geofm Glade PARAS, MD  HPI: 73 year old female, never smoker followed for nocturnal hypoxemia, pulmonary hypertension, obesity hypoventilation syndrome and asthma.  She is a patient of Dr. Cathye and last seen in office 11/26/2022 by [facility] NP.  Past medical history significant for CHF, former DVT on chronic anticoagulation, hypertension, CAD, GERD, PCP, DM2, morbid obesity, CKD stage III, HLD  TEST/EVENTS:  05/07/2020 CTA chest: Mild cardiomegaly suggestive of PA hypertension.  Atherosclerosis.  No lymphadenopathy.  Minimal groundglass opacities in the lung bases.  There is slight suboptimal opacification of main PA without central or proximal segmental PE.  Small hiatal hernia 11/18/2020 echocardiogram: EF 65 to 70%.  LVH.  G1 DD.  The interventricular septum is flattened consistent with RV pressure and volume overload 11/19/2020 right heart cath: Mild to moderate pulmonary hypertension there is minimally elevated PC WP.  Normal CO 12/09/2020 split-night study: Negative for OSA.  Nocturnal desaturations to 84% requiring 1 L/min O2.  Recommended continued use of BiPAP for CO2 blow off and 1 L supplemental O2 at night 04/04/2021 CXR 2 view: Frontal and lateral views of the chest were unremarkable.  There was no acute airspace disease noted. 08/29/2021 CXR: vascular congestion. Lower lung interstitial opacities suggestive of edema  10/07/2022 CXR: lungs clear  11/26/2022: OV with Sharine Cadle NP Patient presents today for follow-up.  She was doing better after her last visit.  Felt that the Z-Pak and prednisone  cleared up her symptoms.  Then about 2 weeks ago, she started having a increased  productive cough again with yellow sputum and would get into coughing fits that sometimes caused her to gag.  She was also feeling a little more short winded with these coughing spells.  Did notice some wheezing as well.  At this time, she was having some more swelling in her legs.  Her cardiologist had adjusted her fluid pill but then her nephrologist changed it again.  She does feel like the swelling is better.  Her nephrologist also put her on a course of doxycycline  for the productive cough.  She is currently on day 5.  Is feeling better.  Feels like she is not coughing as much and chest congestion has improved.  Feels like her breathing is close to her baseline.  She does still have some nasal congestion and postnasal drainage.  When she blows her nose mucus is usually white to yellow.  Denies any fevers, chills, hemoptysis, orthopnea, headaches, sore throat. No known sick exposures.She's using her rescue inhaler less. Still on Breztri  twice a day. Takes her singulair , flonase , mucinex , and claritin . Has not been using the tessalon  since she started the doxycycline .    09/30/2023: Today - follow up Discussed the use of AI scribe software for clinical note transcription with the patient, who gave verbal consent to proceed.  History of Present Illness April [name] is a 73 year old female with asthma who presents for follow up.  She experiences a chronic cough that worsens with weather changes. She uses her rescue inhaler a couple of times a week, primarily when the coughing picks up. She continues to use her  daily inhalers and sometimes wears her BiPAP at night. She has tried cough medicine, but it made her feel sick, so she prefers to avoid it unless necessary. She also uses benzonatate  gel capsules for cough relief. No fevers or hemoptysis. Breathing is stable. No wheezing.   She recently had a urinary tract infection (UTI) for which she was referred to urgent care. Initial treatment was  ineffective, leading her to consult her primary care doctor and subsequently a gynecologist. She received medication and injections in both hips. Following this, she experienced blood in her urine and was referred to a urologist. A CT scan revealed a spot on her kidney or bladder, and an MRI is scheduled for February 10, 2024, as she requires sedation due to claustrophobia. She feels some better since all of this happened. No flank pain, N/V, abdominal pain, dysuria.    Allergies  Allergen Reactions   Penicillins Other (See Comments) and Rash    She was told not to take it anymore.  Has patient had a PCN reaction causing immediate rash, facial/tongue/throat swelling, SOB or lightheadedness with hypotension: Yes  Has patient had a PCN reaction causing severe rash involving mucus membranes or skin necrosis: No  Has patient had a PCN reaction that required hospitalization: No  Has patient had a PCN reaction occurring within the last 10 years: No  If all of the above answers are NO, then may proceed with Cephalosporin use.'  Other Reaction(s): Not available  Other Reaction(s): Other (See Comments)  She was told not to take it anymore. Has patient had a PCN reaction causing immediate rash, facial/tongue/throat swelling, SOB or lightheadedness with hypotension: Yes Has patient had a PCN reaction causing severe rash involving mucus membranes or skin necrosis: No Has patient had a PCN reaction that required hospitalization: No Has patient had a PCN reaction occurring within the last 10 years: No If all of the above answers are NO, then may proceed with Cephalosporin use.'   Shellfish Allergy Anaphylaxis   Sulfa Antibiotics Other (See Comments) and Anaphylaxis    Unknown reaction  Other Reaction(s): Other (See Comments)   Sulfonamide Derivatives Anaphylaxis and Other (See Comments)   Hydrochlorothiazide-Triamterene Other (See Comments)    Hypokalemia   Lotensin [Benazepril Hcl] Hives    Dipyridamole Other (See Comments)    Unknown reaction  Other Reaction(s): Not available   Estrogens Other (See Comments)    Unknown reaction   Hydrochlorothiazide Other (See Comments)    Unknown reaction, Hypokalemia?   Latex Other (See Comments)    Unknown reaction - stated previously during surgery gloves had to be changed, but unsure if it is still an allergy or not.    Metronidazole Other (See Comments)    Unknown reaction   Other Itching    PICKLES   Spironolactone  Other (See Comments)    UNSPECIFIED > kidney problems   Torsemide  Other (See Comments)    Unknown reaction   Valsartan Other (See Comments)    Unknown reaction   Doxycycline  Nausea Only and Other (See Comments)    abdominal   Mustard [Allyl Isothiocyanate] Itching    Immunization History  Administered Date(s) Administered   Fluad Quad(high Dose 65+) 10/28/2018, 10/18/2020, 11/07/2021   Fluad Trivalent(High Dose 65+) 11/12/2022   INFLUENZA, HIGH DOSE SEASONAL PF 10/10/2015, 11/06/2016, 11/16/2017, 11/08/2019   Influenza Split 12/03/2010, 11/03/2011   Influenza Whole 11/16/2006, 11/22/2007, 10/31/2008, 10/29/2009   Influenza,inj,Quad PF,6+ Mos 10/19/2012, 10/25/2013, 10/29/2014   Influenza-Unspecified 10/23/2016, 11/18/2016   Moderna Sars-Covid-2 Vaccination  03/13/2019, 04/10/2019, 12/06/2019   PFIZER Comirnaty(Gray Top)Covid-19 Tri-Sucrose Vaccine 11/14/2021   PNEUMOCOCCAL CONJUGATE-20 07/09/2023   Pfizer Covid-19 Vaccine Bivalent Booster 1yrs & up 11/08/2020   Pneumococcal Conjugate-13 06/07/2015   Pneumococcal Polysaccharide-23 03/24/2005, 01/04/2012, 06/29/2017   Respiratory Syncytial Virus Vaccine,Recomb Aduvanted(Arexvy) 01/06/2023   Td 06/24/2009   Tdap 10/09/2014   Zoster Recombinant(Shingrix) 08/11/2016, 10/23/2016   Zoster, Live 01/15/2012    Past Medical History:  Diagnosis Date   Anemia    Asthma    CAD (coronary artery disease)    Carpal tunnel syndrome    Cellulitis of both lower  extremities 04/11/2015   CHF (congestive heart failure) (HCC)    Chronic kidney disease    CKI- followed by [facility]   Colon polyp, hyperplastic 2007 & 2012   Complication of anesthesia 1999   svt with renal calculi surgery, no problems since   CTS (carpal tunnel syndrome)    bilateral   Diabetes mellitus    Eczema    GERD (gastroesophageal reflux disease)    History of kidney stones 1999   Hyperlipidemia    Hypertension    Leg ulcer (HCC) 04/24/2015   Right lateral leg No evidence of an infection Monitor closely Keep edema controlled    Leg ulcer (HCC)    right lower   Meralgia paresthetica    Dr. Onita   Morbid obesity (HCC)    Neuropathy    toes and legs   Osteoarthrosis, unspecified whether generalized or localized, lower leg    knee   PUD (peptic ulcer disease)    Shortness of breath dyspnea    with exertion   Sleep apnea    per progress note 02/25/2018   Type II or unspecified type diabetes mellitus without mention of complication, not stated as uncontrolled    Unspecified hereditary and idiopathic peripheral neuropathy    Urticaria    Vitamin B12 deficiency    Wears glasses    Wound cellulitis    right upper leg, healing well    Tobacco History: Social History   Tobacco Use  Smoking Status Never   Passive exposure: Never  Smokeless Tobacco Never   Counseling given: Not Answered   Outpatient Medications Prior to Visit  Medication Sig Dispense Refill   acetaminophen  (TYLENOL ) 650 MG CR tablet Take 1,300 mg by mouth every 8 (eight) hours as needed for pain.     Alcohol Swabs (DROPSAFE ALCOHOL PREP) 70 % PADS USE TOPICALLY AS DIRECTED 300 each 3   allopurinol  (ZYLOPRIM ) 100 MG tablet TAKE 1 TABLET BY MOUTH TWICE DAILY 180 tablet 1   atorvastatin  (LIPITOR) 40 MG tablet TAKE 1 TABLET(40 MG) BY MOUTH DAILY 90 tablet 2   azelastine  (ASTELIN ) 0.1 % nasal spray Place 2 sprays into both nostrils 2 (two) times daily. Use in each nostril as directed 30 mL 5    benzonatate  (TESSALON ) 200 MG capsule Take 1 capsule (200 mg total) by mouth 2 (two) times daily as needed for cough. 60 capsule 2   Blood Glucose Calibration (TRUE METRIX LEVEL 1) Low SOLN UAD  E11.9 1 each 3   Blood Glucose Monitoring Suppl (TRUE METRIX METER) DEVI True Metrix Level 1 solution     budesonide -glycopyrrolate -formoterol  (BREZTRI  AEROSPHERE) 160-9-4.8 MCG/ACT AERO inhaler Inhale 2 puffs into the lungs in the morning and at bedtime. 3 each 3   Calcium  Carb-Cholecalciferol  (OYSTER SHELL CALCIUM  250+D) 250-3.125 MG-MCG TABS Take 1 tablet by mouth daily at 2 PM.     calcium  carbonate (TUMS -  DOSED IN MG ELEMENTAL CALCIUM ) 500 MG chewable tablet Chew 2 tablets by mouth daily as needed for indigestion or heartburn.     cyanocobalamin  (,VITAMIN B-12,) 1000 MCG/ML injection INJECT INTO MUSCLE EVERY 30 DAYS 10 mL 5   dapagliflozin  propanediol (FARXIGA ) 10 MG TABS tablet Take 10 mg by mouth daily.     diclofenac  Sodium (VOLTAREN ) 1 % GEL Apply 1 application topically as needed (pain).     diphenhydrAMINE  (BENADRYL ) 12.5 MG/5ML liquid Take 25 mg by mouth 4 (four) times daily as needed for allergies.     ELIQUIS  2.5 MG TABS tablet TAKE 1 TABLET BY MOUTH TWICE DAILY 180 tablet 1   famotidine  (PEPCID ) 40 MG tablet Take 1 tablet (40 mg total) by mouth 2 (two) times daily. 180 tablet 3   FEROSUL 325 (65 Fe) MG tablet TAKE 1 TABLET BY MOUTH DAILY ON MONDAY, WEDNESDAY AND FRIDAY 60 tablet 0   fluconazole (DIFLUCAN) 150 MG tablet Take 150 mg by mouth every 3 (three) days.     fluticasone  (FLONASE ) 50 MCG/ACT nasal spray Place 2 sprays into both nostrils daily. 18.2 mL 2   gabapentin  (NEURONTIN ) 100 MG capsule TAKE 1 CAPSULE(100 MG) BY MOUTH THREE TIMES DAILY 90 capsule 1   hydrOXYzine  (ATARAX ) 25 MG tablet Take 1 tablet (25 mg total) by mouth 3 (three) times daily as needed for itching. 90 tablet 1   metroNIDAZOLE (FLAGYL) 500 MG tablet Take 500 mg by mouth 2 (two) times daily.     mometasone   (ELOCON ) 0.1 % cream Apply topically 2 (two) times daily.     montelukast  (SINGULAIR ) 10 MG tablet TAKE 1 TABLET(10 MG) BY MOUTH AT BEDTIME 30 tablet 5   nebivolol  (BYSTOLIC ) 5 MG tablet TAKE 1 TABLET(5 MG) BY MOUTH DAILY 90 tablet 1   nystatin  ointment (MYCOSTATIN ) SMARTSIG:2 Topical Twice Daily     Oxcarbazepine  (TRILEPTAL ) 300 MG tablet TAKE 1 TABLET(300 MG) BY MOUTH TWICE DAILY 60 tablet 5   oxyCODONE  (OXY IR/ROXICODONE ) 5 MG immediate release tablet Take 1 tablet (5 mg total) by mouth every 6 (six) hours as needed for severe pain (pain score 7-10) (Chronic knee osteoarthritis). 20 tablet 0   polyethylene glycol (MIRALAX  / GLYCOLAX ) 17 g packet Take 17 g by mouth daily as needed for moderate constipation.     potassium chloride  SA (KLOR-CON  M) 20 MEQ tablet TAKE 1 TABLET BY MOUTH DAILY AND 3 TABLETS BY MOUTH EVERY WEDNESDAY 102 tablet 3   Spacer/Aero-Holding Raguel FRENCH Use with inhaler. 1 each 2   spironolactone  (ALDACTONE ) 25 MG tablet TAKE 1 TABLET(25 MG) BY MOUTH DAILY 90 tablet 1   tirzepatide  (MOUNJARO ) 15 MG/0.5ML Pen INJECT 15MG  (1 PEN) UNDER THE SKIN EVERY WEEK 6 mL 3   torsemide  (DEMADEX ) 20 MG tablet TAKE 2 TABLETS(40 MG) BY MOUTH TWICE DAILY 360 tablet 3   triamcinolone  ointment (KENALOG ) 0.5 % Apply 1 Application topically as needed (knee pain).     UNABLE TO FIND Med Name: Oxygen  portable at home 2-liters as needed     VENTOLIN  HFA 108 (90 Base) MCG/ACT inhaler Inhale 2 puffs into the lungs every 4 (four) hours as needed for wheezing.     No facility-administered medications prior to visit.     Review of Systems:   Constitutional: No weight loss or gain, night sweats, fevers, chills, +fatigue (stable) HEENT: No headaches, difficulty swallowing, tooth/dental problems, or sore throat. No sneezing, itching, ear ache. +nasal congestion/drainage (baseline) CV:  +swelling in lower extremities (baseline). No  chest pain, orthopnea, PND, anasarca, dizziness, palpitations,  syncope Resp: +shortness of breath with exertion (baseline); chronic cough. No wheeze. No hemoptysis.  No chest wall deformity GI:  No heartburn, indigestion, abdominal pain, nausea, vomiting, diarrhea, change in bowel habits, loss of appetite, bloody stools.  GU: No dysuria Skin: No rash.  MSK:  +knee pain (chronic). No joint swelling.  No decreased range of motion.  No back pain. Neuro: No dizziness or lightheadedness.  Psych: No depression or anxiety. Mood stable.     Physical Exam:  BP 127/80   Pulse 82   Temp 99.1 F (37.3 C)   Ht 5' 2 (1.575 m)   Wt (!) 313 lb (142 kg)   SpO2 94% Comment: RA  BMI 57.25 kg/m   GEN: Pleasant, interactive, chronically-ill appearing; morbidly obese; in no acute distress. HEENT:  Normocephalic and atraumatic. PERRLA. Sclera white. Nasal turbinates pink, moist and patent bilaterally. No rhinorrhea present. Oropharynx pink and moist, without exudate or edema. No lesions, ulcerations NECK:  Supple w/ fair ROM. No JVD present. No LAD.  CV: RRR, no m/r/g, dependent, non-pitting peripheral edema. Pulses intact, +2 bilaterally. No cyanosis, pallor or clubbing. PULMONARY:  Unlabored, regular breathing. Clear bilaterally A&P w/o wheezes/rales/rhonchi. No accessory muscle use. No dullness to percussion. GI: BS present and normoactive. Soft, non-tender to palpation.  MSK: No erythema, warmth or tenderness to left knee. Cap refil <2 sec all extrem. No deformities or joint swelling noted.  Neuro: A/Ox3. No focal deficits noted.   Skin: Intact, warm. Psych: Normal affect and behavior. Judgement and thought content appropriate.     Lab Results:  CBC    Component Value Date/Time   WBC 7.0 08/13/2023 0901   WBC 6.0 07/09/2023 1210   RBC 4.08 08/13/2023 0901   HGB 12.7 08/13/2023 0901   HCT 39.2 08/13/2023 0901   PLT 141 (L) 08/13/2023 0901   MCV 96.1 08/13/2023 0901   MCH 31.1 08/13/2023 0901   MCHC 32.4 08/13/2023 0901   RDW 16.0 (H) 08/13/2023  0901   LYMPHSABS 1.5 08/13/2023 0901   MONOABS 0.6 08/13/2023 0901   EOSABS 0.1 08/13/2023 0901   BASOSABS 0.1 08/13/2023 0901    BMET    Component Value Date/Time   NA 140 08/13/2023 0901   NA 145 (H) 10/25/2012 1117   K 4.0 08/13/2023 0901   CL 100 08/13/2023 0901   CO2 35 (H) 08/13/2023 0901   GLUCOSE 98 08/13/2023 0901   BUN 22 08/13/2023 0901   BUN 24 10/25/2012 1117   CREATININE 2.03 (H) 08/13/2023 0901   CALCIUM  9.5 08/13/2023 0901   GFRNONAA 25 (L) 08/13/2023 0901   GFRAA 44 (L) 02/28/2019 0252    BNP    Component Value Date/Time   BNP 34.2 09/03/2021 1026     Imaging:  No results found.  cyanocobalamin  (VITAMIN B12) injection 1,000 mcg     Date Action Dose Route User   08/27/2023 [phone] Given 1,000 mcg Intramuscular (Right Deltoid) Tastet, Hannah E, CMA           No data to display          No results found for: NITRICOXIDE      Assessment & Plan:   Asthma, moderate persistent No recent exacerbations. Clinically stable and compensated on current regimen. Cough related to upper airway irritation from postnasal drainage.  Lung exam was clear today.  Continue PRN cough control and postnasal drainage.  Continue current maintenance regimen.  Action plan in place.  Patient Instructions  Continue BiPAP nightly  Use for a minimum 6 hours a night, with goal of using entire night.  Change equipment every 30 days or as directed by DME.  Be aware of reduced alertness and do not drive or operate heavy machinery if experiencing this or drowsiness.  -Continue nighttime supplemental O2 therapy at 1L through BiPAP -Continue 2 lpm supplemental oxygen  on POC as needed with activity. Goal oxygen  >88-90%. Notify of increasing requirements.    -Continue Breztri  2 puffs Twice daily. Brush tongue and rinse mouth afterwards.  -Continue Pulmicort  inhaler 2 puffs twice a day as needed for shortness of breath or wheezing  -Continue Ventolin  inhaler 2 puffs as needed  every 4 hours as needed for shortness of breath or wheezing  -Continue Singulair  10 mg At bedtime -Continue Flonase  2 sprays each nostril daily  -Continue Mucinex  600 mg Twice daily for chest congestion -Continue Claritin  10 mg daily for allergies  -Continue benzonatate  1 capsule Three times a day for cough -Continue astelin  nasal spray 2 sprays each nostril Twice daily    Follow up in 6 months with Dr. Neda or Katie Damisha Wolff,NP. If symptoms do not improve or worsen, please contact office for sooner follow up or seek emergency care   Seasonal allergic rhinitis See above. Continue allergy regimen   Chronic respiratory failure with hypoxia and hypercapnia (HCC) Stable without increased O2 requirement. Goal >88-90%. Again reviewed importance of compliance with BiPAP therapy and risks of hypercarbia. Verbalized understanding. Encouraged to wear nightly. Safe driving practices reviewed.   Recurrent UTI Follow up with urology as scheduled      I spent 25 minutes of dedicated to the care of this patient on the date of this encounter to include pre-visit review of records, face-to-face time with the patient discussing conditions above, post visit ordering of testing, clinical documentation with the electronic health record, making appropriate referrals as documented, and communicating necessary findings to members of the patients care team.  Comer LULLA Rouleau, NP 10/01/2023  Pt aware and understands NP's role.

## 2023-10-01 NOTE — Progress Notes (Signed)
 After obtaining consent, and per orders of Dr. Lawerance Bach, injection of B12 given by Ferdie Ping. Patient instructed to report any adverse reaction to me immediately.

## 2023-10-01 NOTE — Assessment & Plan Note (Signed)
 See above. Continue allergy regimen

## 2023-10-05 ENCOUNTER — Telehealth: Payer: Self-pay | Admitting: Internal Medicine

## 2023-10-05 NOTE — Telephone Encounter (Signed)
 FMLA Paperwork was dropped off for Dr Geofm to fill out and current pt husband stated that he is aware that he needs to fill out the rest-- he also stated to give him a call once this information is taking care of either number on pts profile if her husband can not be reached on his cell.. Paperwork placed in providers box..  Please advice, Thanks

## 2023-10-07 NOTE — Telephone Encounter (Signed)
Forms received today.

## 2023-10-14 ENCOUNTER — Other Ambulatory Visit: Payer: Self-pay

## 2023-10-14 ENCOUNTER — Telehealth: Payer: Self-pay

## 2023-10-14 MED ORDER — COVID-19 MRNA VAC-TRIS(PFIZER) 30 MCG/0.3ML IM SUSY: 0.3000 mL | PREFILLED_SYRINGE | Freq: Once | INTRAMUSCULAR | 0 refills | Status: AC

## 2023-10-14 NOTE — Telephone Encounter (Unsigned)
 Copied from CRM [id number]. Topic: General - Other >> Oct 14, 2023  2:18 PM Lauren C wrote: Reason for CRM: FYI- Husband Dwight calling regarding FMLA form. Returning call to [employer]. Pt is requesting the dates to be 10/14/2023-10/13/2024 on the form, and he is requesting that it be put back into the same envelope after completion. He says he will be picking them up tomorrow.

## 2023-10-14 NOTE — Telephone Encounter (Signed)
 Dates updated on FMLA for today.

## 2023-10-14 NOTE — Telephone Encounter (Signed)
 Vaccine ordered.

## 2023-10-14 NOTE — Telephone Encounter (Signed)
 Form completed and waiting for signature from Dr. Geofm.  Wife made aware of status today and that form can be picked up tomorrow.

## 2023-10-14 NOTE — Telephone Encounter (Signed)
 Copied from CRM #[id number]. Topic: Clinical - Medication Question >> Oct 14, 2023  2:33 PM Turkey A wrote: Reason for CRM: Patient would like Covid Vaccine prescription sent to [facility] on [address] [place]; Patient also wants to know if she can get the RSV vaccine same time as Covid-please contact

## 2023-10-17 DIAGNOSIS — J9601 Acute respiratory failure with hypoxia: Secondary | ICD-10-CM | POA: Diagnosis not present

## 2023-10-17 DIAGNOSIS — I5033 Acute on chronic diastolic (congestive) heart failure: Secondary | ICD-10-CM | POA: Diagnosis not present

## 2023-10-17 DIAGNOSIS — J9602 Acute respiratory failure with hypercapnia: Secondary | ICD-10-CM | POA: Diagnosis not present

## 2023-11-01 ENCOUNTER — Other Ambulatory Visit: Payer: Self-pay | Admitting: Internal Medicine

## 2023-11-01 ENCOUNTER — Ambulatory Visit

## 2023-11-01 DIAGNOSIS — E538 Deficiency of other specified B group vitamins: Secondary | ICD-10-CM | POA: Diagnosis not present

## 2023-11-01 MED ORDER — TORSEMIDE 20 MG PO TABS: ORAL_TABLET | ORAL | 3 refills | Status: AC

## 2023-11-01 MED ORDER — CYANOCOBALAMIN 1000 MCG/ML IJ SOLN
1000.0000 ug | Freq: Once | INTRAMUSCULAR | Status: AC
  Administered 2023-11-01: 1000 ug via INTRAMUSCULAR

## 2023-11-01 MED ORDER — SPIRONOLACTONE 25 MG PO TABS: ORAL_TABLET | ORAL | 1 refills | Status: DC

## 2023-11-01 NOTE — Telephone Encounter (Signed)
 Copied from CRM [id number]. Topic: Clinical - Medication Refill >> Nov 01, 2023 11:22 AM [name] wrote: Medication: spironolactone  (ALDACTONE ) 25 MG tablet torsemide  (DEMADEX ) 20 MG tablet   Has the patient contacted their pharmacy? No (Agent: If no, request that the patient contact the pharmacy for the refill. If patient does not wish to contact the pharmacy document the reason why and proceed with request.) (Agent: If yes, when and what did the pharmacy advise?)  This is the patient's preferred pharmacy:  [facility] [address], [place] - [address] [place] [address] Phone: [phone] Fax: [phone]   Is this the correct pharmacy for this prescription? Yes If no, delete pharmacy and type the correct one.   Has the prescription been filled recently? No  Is the patient out of the medication? Yes  Has the patient been seen for an appointment in the last year OR does the patient have an upcoming appointment? Yes  Can we respond through MyChart? Yes  Agent: Please be advised that Rx refills may take up to 3 business days. We ask that you follow-up with your pharmacy.

## 2023-11-01 NOTE — Progress Notes (Signed)
 After obtaining consent, and per orders of Dr. Lawerance Bach, injection of B12 given by Ferdie Ping. Patient instructed to report any adverse reaction to me immediately.

## 2023-11-10 ENCOUNTER — Telehealth: Payer: Self-pay

## 2023-11-10 NOTE — Telephone Encounter (Signed)
 Spoke with patient today and she does not need another RSV shot. She had one done last year.

## 2023-11-10 NOTE — Telephone Encounter (Signed)
 Copied from CRM [id number]. Topic: Clinical - Medication Question >> Nov 10, 2023 10:22 AM Alfonso ORN wrote: Reason for CRM: PT wants to know if she needs the RSV shot.

## 2023-11-16 DIAGNOSIS — J9602 Acute respiratory failure with hypercapnia: Secondary | ICD-10-CM | POA: Diagnosis not present

## 2023-11-16 DIAGNOSIS — J9601 Acute respiratory failure with hypoxia: Secondary | ICD-10-CM | POA: Diagnosis not present

## 2023-11-16 DIAGNOSIS — I5033 Acute on chronic diastolic (congestive) heart failure: Secondary | ICD-10-CM | POA: Diagnosis not present

## 2023-11-22 ENCOUNTER — Other Ambulatory Visit: Payer: Self-pay | Admitting: Internal Medicine

## 2023-11-25 ENCOUNTER — Telehealth: Payer: Self-pay

## 2023-11-25 NOTE — Telephone Encounter (Signed)
 Copied from CRM [id number]. Topic: Medical Record Request - Other >> Nov 25, 2023  9:35 AM Robinson H wrote: Reason for CRM: Patient is checking to see if [facility] sent over documentation that shows she had her flu vaccine and covid vaccine to put in her records.  Naphtali [phone]

## 2023-11-29 ENCOUNTER — Ambulatory Visit

## 2023-11-29 ENCOUNTER — Other Ambulatory Visit: Payer: Self-pay | Admitting: Internal Medicine

## 2023-11-29 NOTE — Telephone Encounter (Unsigned)
 Copied from CRM #[id number]. Topic: Clinical - Medication Refill >> Nov 29, 2023 12:09 PM Leah W wrote: Medication: montelukast  (SINGULAIR ) 10 MG tablet  FEROSUL 325 (65 Fe) MG tablet  *90 day supply requested  Has the patient contacted their pharmacy? Yes (Agent: If no, request that the patient contact the pharmacy for the refill. If patient does not wish to contact the pharmacy document the reason why and proceed with request.) (Agent: If yes, when and what did the pharmacy advise?)  This is the patient's preferred pharmacy:  [facility] [address], [place] - [address] [place] [address] Phone: [phone] Fax: [phone]   Is this the correct pharmacy for this prescription? Yes If no, delete pharmacy and type the correct one.   Has the prescription been filled recently? No  Is the patient out of the medication? No  Has the patient been seen for an appointment in the last year OR does the patient have an upcoming appointment? Yes  Can we respond through MyChart? No  Agent: Please be advised that Rx refills may take up to 3 business days. We ask that you follow-up with your pharmacy.

## 2023-11-30 MED ORDER — FERROUS SULFATE 325 (65 FE) MG PO TABS: 325.0000 mg | ORAL_TABLET | Freq: Three times a day (TID) | ORAL | 0 refills | Status: AC

## 2023-11-30 MED ORDER — MONTELUKAST SODIUM 10 MG PO TABS: ORAL_TABLET | ORAL | 5 refills | Status: DC

## 2023-12-02 NOTE — Telephone Encounter (Signed)
 Vaccines updated

## 2023-12-03 ENCOUNTER — Ambulatory Visit (INDEPENDENT_AMBULATORY_CARE_PROVIDER_SITE_OTHER)

## 2023-12-03 ENCOUNTER — Ambulatory Visit: Admitting: Podiatry

## 2023-12-03 DIAGNOSIS — Z7901 Long term (current) use of anticoagulants: Secondary | ICD-10-CM

## 2023-12-03 DIAGNOSIS — E538 Deficiency of other specified B group vitamins: Secondary | ICD-10-CM | POA: Diagnosis not present

## 2023-12-03 DIAGNOSIS — L84 Corns and callosities: Secondary | ICD-10-CM

## 2023-12-03 DIAGNOSIS — M79675 Pain in left toe(s): Secondary | ICD-10-CM

## 2023-12-03 DIAGNOSIS — B351 Tinea unguium: Secondary | ICD-10-CM | POA: Diagnosis not present

## 2023-12-03 DIAGNOSIS — M79674 Pain in right toe(s): Secondary | ICD-10-CM

## 2023-12-03 MED ORDER — CYANOCOBALAMIN 1000 MCG/ML IJ SOLN
1000.0000 ug | Freq: Once | INTRAMUSCULAR | Status: AC
  Administered 2023-12-03: 1000 ug via INTRAMUSCULAR

## 2023-12-03 NOTE — Progress Notes (Signed)
 Subjective: Chief Complaint  Patient presents with   Nail Problem    Patient is here for RFC Pain due to onychomycosis of toenails of both feet      73 y.o. returns the office today for painful, elongated, thickened toenails which she cannot trim herself. Denies any redness or drainage around the nails.  She has a callus on the left heel which causes discomfort at times and she would like to have this trimmed. No open lesoins.   On Eliquis    PCP: Geofm Glade PARAS, MD Last Seen: 07/09/2023   Objective: AAO 3, NAD- in wheelchair DP/PT pulses decreased- feet are warm and perfused.  Chronic lower extremity edema present.  Sensation decreased bilaterally.  Nails hypertrophic, dystrophic, elongated, brittle, discolored 10. There is tenderness overlying the nails 1-5 bilaterally. There is no surrounding erythema or drainage along the nail sites. Hyperkeratotic lesion left plantar heel without any underlying ulceration, drainage or signs of infection. No open lesions noted bilaterally. No pain with calf compression, swelling, warmth, erythema.  Assessment: Patient presents with symptomatic onychomycosis, hyperkeratotic lesion on anticoagulation.  Plan: -Treatment options including alternatives, risks, complications were discussed -Nails sharply debrided 10 without complication/bleeding. -Serpe debrided hyperkeratotic lesion x 1 without any complications or bleeding. Moisturizer/offloading daily.  -Discussed daily foot inspection. If there are any changes, to call the office immediately.   Return in about 3 months (around 03/04/2024).   Donnice Fees, DPM

## 2023-12-03 NOTE — Progress Notes (Signed)
 After obtaining consent, and per orders of Dr. Lawerance Bach, injection of B12 given by Ferdie Ping. Patient instructed to report any adverse reaction to me immediately.

## 2023-12-08 DIAGNOSIS — N2581 Secondary hyperparathyroidism of renal origin: Secondary | ICD-10-CM | POA: Diagnosis not present

## 2023-12-08 DIAGNOSIS — I890 Lymphedema, not elsewhere classified: Secondary | ICD-10-CM | POA: Diagnosis not present

## 2023-12-08 DIAGNOSIS — D631 Anemia in chronic kidney disease: Secondary | ICD-10-CM | POA: Diagnosis not present

## 2023-12-08 DIAGNOSIS — N189 Chronic kidney disease, unspecified: Secondary | ICD-10-CM | POA: Diagnosis not present

## 2023-12-08 DIAGNOSIS — E1122 Type 2 diabetes mellitus with diabetic chronic kidney disease: Secondary | ICD-10-CM | POA: Diagnosis not present

## 2023-12-08 DIAGNOSIS — N184 Chronic kidney disease, stage 4 (severe): Secondary | ICD-10-CM | POA: Diagnosis not present

## 2023-12-08 DIAGNOSIS — I5032 Chronic diastolic (congestive) heart failure: Secondary | ICD-10-CM | POA: Diagnosis not present

## 2023-12-08 LAB — LAB REPORT - SCANNED
Albumin, Urine POC: 38.4
Microalb Creat Ratio: 28
PTH, Intact: 128

## 2023-12-13 ENCOUNTER — Other Ambulatory Visit: Payer: Self-pay | Admitting: Internal Medicine

## 2023-12-13 NOTE — Telephone Encounter (Unsigned)
 Copied from CRM #[id number]. Topic: Clinical - Medication Refill >> Dec 13, 2023 11:41 AM Alexandria E wrote: Medication: Oxcarbazepine  (TRILEPTAL ) 300 MG tablet gabapentin  (NEURONTIN ) 100 MG capsule  *Requesting 90-day supply*  Has the patient contacted their pharmacy? Yes (Agent: If no, request that the patient contact the pharmacy for the refill. If patient does not wish to contact the pharmacy document the reason why and proceed with request.) (Agent: If yes, when and what did the pharmacy advise?)  This is the patient's preferred pharmacy:  [facility] [address], [place] - [address] [place] [address] Phone: [phone] Fax: [phone]   Is this the correct pharmacy for this prescription? Yes If no, delete pharmacy and type the correct one.   Has the prescription been filled recently? No  Is the patient out of the medication? Yes  Has the patient been seen for an appointment in the last year OR does the patient have an upcoming appointment? Yes  Can we respond through MyChart? Yes  Agent: Please be advised that Rx refills may take up to 3 business days. We ask that you follow-up with your pharmacy.

## 2023-12-14 MED ORDER — OXCARBAZEPINE 300 MG PO TABS: ORAL_TABLET | ORAL | 1 refills | Status: DC

## 2023-12-14 MED ORDER — GABAPENTIN 100 MG PO CAPS: ORAL_CAPSULE | ORAL | 1 refills | Status: AC

## 2023-12-17 DIAGNOSIS — J9602 Acute respiratory failure with hypercapnia: Secondary | ICD-10-CM | POA: Diagnosis not present

## 2023-12-17 DIAGNOSIS — I5033 Acute on chronic diastolic (congestive) heart failure: Secondary | ICD-10-CM | POA: Diagnosis not present

## 2023-12-17 DIAGNOSIS — J9601 Acute respiratory failure with hypoxia: Secondary | ICD-10-CM | POA: Diagnosis not present

## 2023-12-20 ENCOUNTER — Other Ambulatory Visit: Payer: Self-pay | Admitting: Internal Medicine

## 2023-12-20 ENCOUNTER — Telehealth: Payer: Self-pay | Admitting: Internal Medicine

## 2023-12-20 ENCOUNTER — Other Ambulatory Visit (HOSPITAL_COMMUNITY): Payer: Self-pay | Admitting: Internal Medicine

## 2023-12-20 DIAGNOSIS — E1140 Type 2 diabetes mellitus with diabetic neuropathy, unspecified: Secondary | ICD-10-CM

## 2023-12-20 NOTE — Telephone Encounter (Unsigned)
 Copied from CRM #[id number]. Topic: Clinical - Medication Refill >> Dec 20, 2023  1:30 PM Drema MATSU wrote: Medication: tirzepatide  (MOUNJARO ) 15 MG/0.5ML Pen **requesting a 3 month supply   Has the patient contacted their pharmacy? Yes (Agent: If no, request that the patient contact the pharmacy for the refill. If patient does not wish to contact the pharmacy document the reason why and proceed with request.) (Agent: If yes, when and what did the pharmacy advise?)  This is the patient's preferred pharmacy:   [facility] - [place], [place] - [address] [place] [address] Phone: [phone] Fax: [phone]  Is this the correct pharmacy for this prescription? Yes If no, delete pharmacy and type the correct one.   Has the prescription been filled recently? Yes  Is the patient out of the medication? No  Has the patient been seen for an appointment in the last year OR does the patient have an upcoming appointment? Yes  Can we respond through MyChart? No  Agent: Please be advised that Rx refills may take up to 3 business days. We ask that you follow-up with your pharmacy.

## 2023-12-20 NOTE — Telephone Encounter (Unsigned)
 Copied from CRM #[id number]. Topic: Clinical - Medication Refill >> Dec 20, 2023  1:24 PM Drema MATSU wrote: Medication: potassium chloride  SA (KLOR-CON  M) 20 MEQ tablet, famotidine  (PEPCID ) 40 MG tablet, montelukast  (SINGULAIR ) 10 MG tablet, allopurinol  (ZYLOPRIM ) 100 MG tablet, **Requesting 3 month supply for these  Has the patient contacted their pharmacy? Yes (Agent: If no, request that the patient contact the pharmacy for the refill. If patient does not wish to contact the pharmacy document the reason why and proceed with request.) wants a 3 month supply (Agent: If yes, when and what did the pharmacy advise?)  This is the patient's preferred pharmacy:  [facility] [address] [place] - [address] [place] [address] Phone: [phone] Fax: [phone]  Is this the correct pharmacy for this prescription? Yes If no, delete pharmacy and type the correct one.   Has the prescription been filled recently? Yes  Is the patient out of the medication? Yes  Has the patient been seen for an appointment in the last year OR does the patient have an upcoming appointment? Yes  Can we respond through MyChart? no  Agent: Please be advised that Rx refills may take up to 3 business days. We ask that you follow-up with your pharmacy.

## 2023-12-21 ENCOUNTER — Other Ambulatory Visit: Payer: Self-pay

## 2023-12-21 MED ORDER — MOUNJARO 15 MG/0.5ML ~~LOC~~ SOAJ
15.0000 mg | SUBCUTANEOUS | 1 refills | Status: DC
Start: 2023-12-21 — End: 2023-12-27

## 2023-12-21 MED ORDER — MONTELUKAST SODIUM 10 MG PO TABS: ORAL_TABLET | ORAL | 1 refills | Status: DC

## 2023-12-21 MED ORDER — ALLOPURINOL 100 MG PO TABS: 100.0000 mg | ORAL_TABLET | Freq: Two times a day (BID) | ORAL | 1 refills | Status: AC

## 2023-12-21 MED ORDER — FAMOTIDINE 40 MG PO TABS: 40.0000 mg | ORAL_TABLET | Freq: Two times a day (BID) | ORAL | 3 refills | Status: AC

## 2023-12-21 NOTE — Telephone Encounter (Signed)
Spoke with patient and refills sent 

## 2023-12-24 ENCOUNTER — Ambulatory Visit

## 2023-12-24 DIAGNOSIS — E538 Deficiency of other specified B group vitamins: Secondary | ICD-10-CM

## 2023-12-24 MED ORDER — CYANOCOBALAMIN 1000 MCG/ML IJ SOLN
1000.0000 ug | Freq: Once | INTRAMUSCULAR | Status: AC
  Administered 2023-12-24: 1000 ug via INTRAMUSCULAR

## 2023-12-24 NOTE — Progress Notes (Signed)
 Pt has received her B12 injection with no complications.

## 2023-12-26 ENCOUNTER — Other Ambulatory Visit: Payer: Self-pay | Admitting: Internal Medicine

## 2023-12-26 DIAGNOSIS — E1140 Type 2 diabetes mellitus with diabetic neuropathy, unspecified: Secondary | ICD-10-CM

## 2023-12-29 ENCOUNTER — Telehealth (HOSPITAL_COMMUNITY): Payer: Self-pay

## 2023-12-29 NOTE — Telephone Encounter (Signed)
 Called to confirm/remind patient of their appointment at the [facility] on 01/03/24 11:00.   Appointment:   [x] Confirmed  [] Left mess   [] No answer/No voice mail  [] VM Full/unable to leave message  [] Phone not in service  Patient reminded to bring all medications and/or complete list.  Confirmed patient has transportation. Gave directions, instructed to utilize valet parking.

## 2023-12-29 NOTE — Progress Notes (Signed)
 [facility] Note   PCP: Geofm Glade PARAS, MD Nephrology: Dr. Gearline HF Cardiologist: Dr. Cherrie  Reason for visit: f/u for chronic diastolic heart failure   HPI: April [name] is a 73 y.o. woman with morbid obesity, DM2, HTN, HLD, mild-to- moderate CAD, asthma, and chronic dHF.  Cath 2/08: 50-60% LAD lesion and 30% RCA lesion.  EF 70%.  Mild pulmonary HTN primarily due to high output state.  PA mean 27 PCWP 10 PVR 1.8   Admitted 4/22 for HF Echo EF 60-65%. RV normal. She was diuresed and placed back on PO diuretics, switched from lasix  80 bid to torsemide  60 mg d/c. D/c wt 371 lb.   She saw pulmonology for OSA 10/02/20, sleep study ordered, low dose Lunesta  added.  10/22 admitted with concerns of CO2 retention and HF Underwent RHC showing mild to moderate pulmonary hypertension, minimally elevated PCWP (18) and normal CO.  Echo 10/22 LVEF 65-70% RV normal RVSP  Sleep study 11/22 showed no OSA but + nocturnal desaturations to 84%, ONO ordered.  Acute visit 8/23, volume appeared up. CXR ordered by PCP showed mild pulmonary vascular congestion. Metolazone  added weekly to her regimen. Follow up labs showed elevated SCr, metolazone  stopped and torsemide  reduced to 40 bid.   Saw vascular 10/23 with complaints of waxing and waning cool RLE. ABIs howed moderate R PAD, mild L PAD.  Echo 3/24 EF 65-70% RV normal  Today she returns for AHF follow up. Overall feeling ok. Denies palpitations, CP, dizziness, or PND/Orthopnea. Chronic swelling in BLE. SOB with activity. Appetite ok, avoids salty foods. No fever or chills. Weight at home 315 pounds. Taking all medications. Denies ETOH, tobacco or drug use. Drinks ~64 oz/day.   Cardiac Studies: - Echo 3/24: EF 65-70% RV normal - Echo 10/22: EF 65-70% RV normal RVSP 23 mm HG - RHC (10/22):  RA = 11 RV = 68/12 PA = 69/22 (39) PCW = 18 Fick cardiac output/index = 7.0/2.8 PVR = 3.0 WU Ao sat = 96% PA sat = 61%, 62% SVC  sat 69% PAPi = 4.2  - Echo (10/22): EF 65-70%, grade I DD, RV ok - Echo 4/22: EF 60-65%. RV ok.   ROS: All systems reviewed and negative except as per HPI.   Past Medical History:  Diagnosis Date   Anemia    Asthma    CAD (coronary artery disease)    Carpal tunnel syndrome    Cellulitis of both lower extremities 04/11/2015   CHF (congestive heart failure) (HCC)    Chronic kidney disease    CKI- followed by [facility]   Colon polyp, hyperplastic 2007 & 2012   Complication of anesthesia 1999   svt with renal calculi surgery, no problems since   CTS (carpal tunnel syndrome)    bilateral   Diabetes mellitus    Eczema    GERD (gastroesophageal reflux disease)    History of kidney stones 1999   Hyperlipidemia    Hypertension    Leg ulcer (HCC) 04/24/2015   Right lateral leg No evidence of an infection Monitor closely Keep edema controlled    Leg ulcer (HCC)    right lower   Meralgia paresthetica    Dr. Onita   Morbid obesity (HCC)    Neuropathy    toes and legs   Osteoarthrosis, unspecified whether generalized or localized, lower leg    knee   PUD (peptic ulcer disease)    Shortness of breath dyspnea    with exertion  Sleep apnea    per progress note 02/25/2018   Type II or unspecified type diabetes mellitus without mention of complication, not stated as uncontrolled    Unspecified hereditary and idiopathic peripheral neuropathy    Urticaria    Vitamin B12 deficiency    Wears glasses    Wound cellulitis    right upper leg, healing well   Current Outpatient Medications  Medication Sig Dispense Refill   acetaminophen  (TYLENOL ) 650 MG CR tablet Take 1,300 mg by mouth every 8 (eight) hours as needed for pain.     Alcohol Swabs (DROPSAFE ALCOHOL PREP) 70 % PADS USE TOPICALLY AS DIRECTED 300 each 3   allopurinol  (ZYLOPRIM ) 100 MG tablet Take 1 tablet (100 mg total) by mouth 2 (two) times daily. 180 tablet 1   atorvastatin  (LIPITOR) 40 MG tablet TAKE 1 TABLET(40 MG) BY  MOUTH DAILY 90 tablet 2   azelastine  (ASTELIN ) 0.1 % nasal spray Place 2 sprays into both nostrils 2 (two) times daily. Use in each nostril as directed 30 mL 5   benzonatate  (TESSALON ) 200 MG capsule Take 1 capsule (200 mg total) by mouth 2 (two) times daily as needed for cough. 60 capsule 2   Blood Glucose Calibration (TRUE METRIX LEVEL 1) Low SOLN UAD  E11.9 1 each 3   Blood Glucose Monitoring Suppl (TRUE METRIX METER) DEVI True Metrix Level 1 solution     budesonide -glycopyrrolate -formoterol  (BREZTRI  AEROSPHERE) 160-9-4.8 MCG/ACT AERO inhaler Inhale 2 puffs into the lungs in the morning and at bedtime. 3 each 3   Calcium  Carb-Cholecalciferol  (OYSTER SHELL CALCIUM  250+D) 250-3.125 MG-MCG TABS Take 1 tablet by mouth daily at 2 PM.     calcium  carbonate (TUMS - DOSED IN MG ELEMENTAL CALCIUM ) 500 MG chewable tablet Chew 2 tablets by mouth daily as needed for indigestion or heartburn.     cyanocobalamin  (,VITAMIN B-12,) 1000 MCG/ML injection INJECT INTO MUSCLE EVERY 30 DAYS 10 mL 5   dapagliflozin  propanediol (FARXIGA ) 10 MG TABS tablet Take 10 mg by mouth daily.     diclofenac  Sodium (VOLTAREN ) 1 % GEL Apply 1 application topically as needed (pain).     diphenhydrAMINE  (BENADRYL ) 12.5 MG/5ML liquid Take 25 mg by mouth 4 (four) times daily as needed for allergies.     ELIQUIS  2.5 MG TABS tablet TAKE 1 TABLET BY MOUTH TWICE DAILY 180 tablet 1   famotidine  (PEPCID ) 40 MG tablet Take 1 tablet (40 mg total) by mouth 2 (two) times daily. 180 tablet 3   ferrous sulfate  (FEROSUL) 325 (65 FE) MG tablet Take 1 tablet (325 mg total) by mouth 3 (three) times daily with meals. 60 tablet 0   fluconazole (DIFLUCAN) 150 MG tablet Take 150 mg by mouth every 3 (three) days.     fluticasone  (FLONASE ) 50 MCG/ACT nasal spray Place 2 sprays into both nostrils daily. 18.2 mL 2   gabapentin  (NEURONTIN ) 100 MG capsule TAKE 1 CAPSULE(100 MG) BY MOUTH THREE TIMES DAILY 90 capsule 1   hydrOXYzine  (ATARAX ) 25 MG tablet Take  1 tablet (25 mg total) by mouth 3 (three) times daily as needed for itching. 90 tablet 1   metroNIDAZOLE (FLAGYL) 500 MG tablet Take 500 mg by mouth 2 (two) times daily.     montelukast  (SINGULAIR ) 10 MG tablet TAKE 1 TABLET(10 MG) BY MOUTH AT BEDTIME 90 tablet 1   nebivolol  (BYSTOLIC ) 5 MG tablet TAKE 1 TABLET(5 MG) BY MOUTH DAILY 90 tablet 1   Oxcarbazepine  (TRILEPTAL ) 300 MG tablet TAKE 1 TABLET(300  MG) BY MOUTH TWICE DAILY 180 tablet 1   oxyCODONE  (OXY IR/ROXICODONE ) 5 MG immediate release tablet Take 1 tablet (5 mg total) by mouth every 6 (six) hours as needed for severe pain (pain score 7-10) (Chronic knee osteoarthritis). 20 tablet 0   polyethylene glycol (MIRALAX  / GLYCOLAX ) 17 g packet Take 17 g by mouth daily as needed for moderate constipation.     potassium chloride  SA (KLOR-CON  M) 20 MEQ tablet TAKE 1 TABLET DAILY AND 3 TABLETS EVERY WEDNESDAY 102 tablet 3   Spacer/Aero-Holding Raguel FRENCH Use with inhaler. 1 each 2   spironolactone  (ALDACTONE ) 25 MG tablet TAKE 1 TABLET(25 MG) BY MOUTH DAILY 90 tablet 1   tirzepatide  (MOUNJARO ) 15 MG/0.5ML Pen INJECT 15MG  (1 PEN) UNDER THE SKIN EVERY WEEK 6 mL 3   torsemide  (DEMADEX ) 20 MG tablet TAKE 2 TABLETS(40 MG) BY MOUTH TWICE DAILY 360 tablet 3   triamcinolone  ointment (KENALOG ) 0.5 % Apply 1 Application topically as needed (knee pain).     UNABLE TO FIND Med Name: Oxygen  portable at home 2-liters as needed     VENTOLIN  HFA 108 (90 Base) MCG/ACT inhaler Inhale 2 puffs into the lungs every 4 (four) hours as needed for wheezing.     mometasone  (ELOCON ) 0.1 % cream Apply topically 2 (two) times daily. (Patient not taking: Reported on 01/03/2024)     nystatin  ointment (MYCOSTATIN ) SMARTSIG:2 Topical Twice Daily (Patient not taking: Reported on 01/03/2024)     No current facility-administered medications for this encounter.   Allergies  Allergen Reactions   Penicillins Other (See Comments) and Rash    She was told not to take it anymore.  Has  patient had a PCN reaction causing immediate rash, facial/tongue/throat swelling, SOB or lightheadedness with hypotension: Yes  Has patient had a PCN reaction causing severe rash involving mucus membranes or skin necrosis: No  Has patient had a PCN reaction that required hospitalization: No  Has patient had a PCN reaction occurring within the last 10 years: No  If all of the above answers are NO, then may proceed with Cephalosporin use.'  Other Reaction(s): Not available  Other Reaction(s): Other (See Comments)  She was told not to take it anymore. Has patient had a PCN reaction causing immediate rash, facial/tongue/throat swelling, SOB or lightheadedness with hypotension: Yes Has patient had a PCN reaction causing severe rash involving mucus membranes or skin necrosis: No Has patient had a PCN reaction that required hospitalization: No Has patient had a PCN reaction occurring within the last 10 years: No If all of the above answers are NO, then may proceed with Cephalosporin use.'   Shellfish Allergy Anaphylaxis   Sulfa Antibiotics Other (See Comments) and Anaphylaxis    Unknown reaction  Other Reaction(s): Other (See Comments)   Sulfonamide Derivatives Anaphylaxis and Other (See Comments)   Hydrochlorothiazide-Triamterene Other (See Comments)    Hypokalemia   Lotensin [Benazepril Hcl] Hives   Dipyridamole Other (See Comments)    Unknown reaction  Other Reaction(s): Not available   Estrogens Other (See Comments)    Unknown reaction   Hydrochlorothiazide Other (See Comments)    Unknown reaction, Hypokalemia?   Latex Other (See Comments)    Unknown reaction - stated previously during surgery gloves had to be changed, but unsure if it is still an allergy or not.    Metronidazole Other (See Comments)    Unknown reaction   Other Itching    PICKLES   Spironolactone  Other (See Comments)    UNSPECIFIED >  kidney problems   Torsemide  Other (See Comments)    Unknown reaction    Valsartan Other (See Comments)    Unknown reaction   Doxycycline  Nausea Only and Other (See Comments)    abdominal   [facility] [place] Isothiocyanate] Itching   Social History   Socioeconomic History   Marital status: Married    Spouse name: Dwight   Number of children: 1   Years of education: BS   Highest education level: Not on file  Occupational History   Occupation: Disabled    Employer: RETIRED  Tobacco Use   Smoking status: Never    Passive exposure: Never   Smokeless tobacco: Never  Vaping Use   Vaping status: Never Used  Substance and Sexual Activity   Alcohol use: No    Alcohol/week: 0.0 standard drinks of alcohol   Drug use: No   Sexual activity: Not Currently  Other Topics Concern   Not on file  Social History Narrative   Patient lives at home with her husband Wilton) . Patient is retired and has a Lawyer.    Caffeine - some times.   Right handed.   Social Drivers of Health   Financial Resource Strain: Medium Risk (06/16/2023)   Overall Financial Resource Strain (CARDIA)    Difficulty of Paying Living Expenses: Somewhat hard  Food Insecurity: No Food Insecurity (06/16/2023)   Hunger Vital Sign    Worried About Running Out of Food in the Last Year: Never true    Ran Out of Food in the Last Year: Never true  Transportation Needs: No Transportation Needs (06/16/2023)   PRAPARE - Administrator, Civil Service (Medical): No    Lack of Transportation (Non-Medical): No  Physical Activity: Inactive (06/16/2023)   Exercise Vital Sign    Days of Exercise per Week: 0 days    Minutes of Exercise per Session: 0 min  Stress: Stress Concern Present (06/16/2023)   [employer] of Occupational Health - Occupational Stress Questionnaire    Feeling of Stress : To some extent  Social Connections: Moderately Isolated (06/16/2023)   Social Connection and Isolation Panel    Frequency of Communication with Friends and Family: More than three times a  week    Frequency of Social Gatherings with Friends and Family: Never    Attends Religious Services: Never    Database Administrator or Organizations: No    Attends Banker Meetings: Never    Marital Status: Married  Catering Manager Violence: Not At Risk (06/16/2023)   Humiliation, Afraid, Rape, and Kick questionnaire    Fear of Current or Ex-Partner: No    Emotionally Abused: No    Physically Abused: No    Sexually Abused: No   Family History  Problem Relation Age of Onset   Colon cancer Mother    Prostate cancer Father    Colon cancer Father    Diabetes Maternal Aunt    Breast cancer Maternal Aunt 40 - 49   Breast cancer Maternal Aunt 60 - 69   Diabetes Maternal Uncle    Diabetes Paternal Aunt    Stroke Paternal Aunt        > 65   Heart disease Paternal Aunt    Diabetes Paternal Uncle    Breast cancer Cousin 50 - 59   BP 132/78   Pulse 92   Ht 5' 2 (1.575 m)   Wt (!) 142.9 kg (315 lb) Comment: home weight from today  SpO2 94%  BMI 57.61 kg/m   Wt Readings from Last 3 Encounters:  01/03/24 (!) 142.9 kg (315 lb)  09/30/23 (!) 142 kg (313 lb)  08/13/23 (!) 142.9 kg (315 lb)   PHYSICAL EXAM: General:  elderly appearing.  No respiratory difficulty. Arrived in [facility] Neck: JVD ~7 cm.  Cor: Regular rate & rhythm. No murmurs. Lungs: clear, diminished bases Extremities: trace BLE edema  Neuro: alert & oriented x 3. Affect pleasant.   ECG (personally reviewed): NSR 90 bpm  ASSESSMENT & PLAN: 1. Chronic Diastolic Heart Failure  - RHC 7991 c/w mild pulmonary hypertension, primarily due to high output state.  Her mean pulmonary artery pressure was 27, wedge pressure was 10. PVR was 1.80 Woods units. - Echo (2017): LVEF 65-70%, G1DD, RV normal - Recent admit for a/c dCHF. Volume improved w/ IV Lasix .  - Echo (4/22): LVEF 60-65%, G2DD  - Echo (10/22): EF 65%, Grade 1 DD, RV normal. - RHC (10/22): w/ mild-mod PH, minimally elevated PCWP (18) - Echo 3/24 EF  65-70% RV normal - Chronic NYHA III,  functional limitation due mainly by body habitus, asthma/chronic resp failure, and physical deconditioning. Volume status looks ok on current regimen.  - Wears compression socks.  - Continue torsemide  40 mg bid.  - Continue Farxiga  10 mg daily. No current GU symptoms but had UTI a couple months ago. Saw GYN and got abx. I asked her to let us  know if there are any reoccurrences.  - Continue spiro 25 mg daily.  - Continue Bystolic  5 mg daily.  - Labs today - Update echo at f/u.   2. Chronic Hypoxic Respiratory Failure - Suspect driven by OHS, split night sleep study showed no OSA. - Continue BiPAP qhs & continue oxygen  during the day. - RHC (10/22): c/w mid-mod PH.  - Respiratory status stable. - Continue oxygen .   3. Bilateral LE DVT (Diagnosed 4/22) - Confirmed by venous dopplers. Chest CT negative for PE. - Continue Eliquis . No bleeding issues.   4. CAD:  - LHC in 2008 showed 50-60% LAD lesion and 30% RCA lesion. - No s/s angina - Continue ? blocker and statin. - No ASA given Eliquis  for DVT tx.    5. Stage IIIb CKD - Baseline SCr ~1.5-1.7 - Last SCr 2.03 7/25 - Followed by Dr. Gearline at [facility]. - Recheck today   6. PAD - ABIs with moderate disease on R - Followed by [facility]  7. Obesity - Body mass index is 57.61 kg/m. - Remains on Mounjaro , weight loss has plateaued.   Follow up in 9 months with Dr. Cherrie  + echo.   April LITTIE Coe, NP  12:04 PM

## 2024-01-03 ENCOUNTER — Encounter (HOSPITAL_COMMUNITY): Payer: Self-pay

## 2024-01-03 ENCOUNTER — Ambulatory Visit (HOSPITAL_COMMUNITY): Payer: Self-pay | Admitting: Internal Medicine

## 2024-01-03 ENCOUNTER — Ambulatory Visit (HOSPITAL_COMMUNITY)
Admission: RE | Admit: 2024-01-03 | Discharge: 2024-01-03 | Disposition: A | Source: Ambulatory Visit | Attending: Internal Medicine

## 2024-01-03 VITALS — BP 132/78 | HR 92 | Ht 62.0 in | Wt 315.0 lb

## 2024-01-03 DIAGNOSIS — Z6841 Body Mass Index (BMI) 40.0 and over, adult: Secondary | ICD-10-CM | POA: Insufficient documentation

## 2024-01-03 DIAGNOSIS — J45909 Unspecified asthma, uncomplicated: Secondary | ICD-10-CM | POA: Insufficient documentation

## 2024-01-03 DIAGNOSIS — Z7985 Long-term (current) use of injectable non-insulin antidiabetic drugs: Secondary | ICD-10-CM | POA: Diagnosis not present

## 2024-01-03 DIAGNOSIS — J9611 Chronic respiratory failure with hypoxia: Secondary | ICD-10-CM | POA: Insufficient documentation

## 2024-01-03 DIAGNOSIS — I272 Pulmonary hypertension, unspecified: Secondary | ICD-10-CM | POA: Diagnosis not present

## 2024-01-03 DIAGNOSIS — I82403 Acute embolism and thrombosis of unspecified deep veins of lower extremity, bilateral: Secondary | ICD-10-CM

## 2024-01-03 DIAGNOSIS — I739 Peripheral vascular disease, unspecified: Secondary | ICD-10-CM

## 2024-01-03 DIAGNOSIS — E1122 Type 2 diabetes mellitus with diabetic chronic kidney disease: Secondary | ICD-10-CM | POA: Insufficient documentation

## 2024-01-03 DIAGNOSIS — Z7901 Long term (current) use of anticoagulants: Secondary | ICD-10-CM | POA: Diagnosis not present

## 2024-01-03 DIAGNOSIS — E785 Hyperlipidemia, unspecified: Secondary | ICD-10-CM | POA: Diagnosis not present

## 2024-01-03 DIAGNOSIS — Z79899 Other long term (current) drug therapy: Secondary | ICD-10-CM | POA: Diagnosis not present

## 2024-01-03 DIAGNOSIS — N1832 Chronic kidney disease, stage 3b: Secondary | ICD-10-CM | POA: Diagnosis not present

## 2024-01-03 DIAGNOSIS — E66813 Obesity, class 3: Secondary | ICD-10-CM | POA: Diagnosis not present

## 2024-01-03 DIAGNOSIS — I2510 Atherosclerotic heart disease of native coronary artery without angina pectoris: Secondary | ICD-10-CM | POA: Diagnosis not present

## 2024-01-03 DIAGNOSIS — I5032 Chronic diastolic (congestive) heart failure: Secondary | ICD-10-CM | POA: Insufficient documentation

## 2024-01-03 DIAGNOSIS — I130 Hypertensive heart and chronic kidney disease with heart failure and stage 1 through stage 4 chronic kidney disease, or unspecified chronic kidney disease: Secondary | ICD-10-CM | POA: Insufficient documentation

## 2024-01-03 DIAGNOSIS — Z86718 Personal history of other venous thrombosis and embolism: Secondary | ICD-10-CM | POA: Diagnosis not present

## 2024-01-03 DIAGNOSIS — E1151 Type 2 diabetes mellitus with diabetic peripheral angiopathy without gangrene: Secondary | ICD-10-CM | POA: Diagnosis not present

## 2024-01-03 LAB — LIPID PANEL
Cholesterol: 143 mg/dL (ref 0–200)
HDL: 39 mg/dL — ABNORMAL LOW (ref >40)
LDL Cholesterol: 93 mg/dL (ref 0–99)
Total CHOL/HDL Ratio: 3.7 ratio
Triglycerides: 53 mg/dL (ref <150)
VLDL: 11 mg/dL (ref 0–40)

## 2024-01-03 LAB — BASIC METABOLIC PANEL WITH GFR
Anion gap: 9 (ref 5–15)
BUN: 18 mg/dL (ref 8–23)
CO2: 34 mmol/L — ABNORMAL HIGH (ref 22–32)
Calcium: 9.3 mg/dL (ref 8.9–10.3)
Chloride: 98 mmol/L (ref 98–111)
Creatinine, Ser: 1.92 mg/dL — ABNORMAL HIGH (ref 0.44–1.00)
GFR, Estimated: 27 mL/min — ABNORMAL LOW (ref >60)
Glucose, Bld: 92 mg/dL (ref 70–99)
Potassium: 4.2 mmol/L (ref 3.5–5.1)
Sodium: 141 mmol/L (ref 135–145)

## 2024-01-03 LAB — BRAIN NATRIURETIC PEPTIDE: B Natriuretic Peptide: 28.5 pg/mL (ref 0.0–100.0)

## 2024-01-03 NOTE — Patient Instructions (Addendum)
 No medication changes today. Labs today - will call you if abnormal. Return to see Dr. Cherrie in 9 months with echo.  CALL US  AT END OF AUGUST AT [phone] TO SCHEDULE THIS APPOINTMENT. Please call us  at [phone] if any questions or concerns prior to next appointment.

## 2024-01-13 ENCOUNTER — Encounter: Payer: Self-pay | Admitting: Internal Medicine

## 2024-01-13 NOTE — Progress Notes (Unsigned)
 Subjective:    Patient ID: April [name], female    DOB: 1950-09-04, 73 y.o.   MRN: [id number]     HPI Bilan is here for follow up of her chronic medical problems.  Cramping in b/l legs calf and ankle.  This can happen during the day and at night.  This is not new, but she is still getting it.  She tries to drink plenty of water.  Medications and allergies reviewed with patient and updated if appropriate.  Medications Ordered Prior to Encounter[1]   Review of Systems  Constitutional:  Negative for fever.  Respiratory:  Positive for cough (occ phlegm comes up  - ? asthma), shortness of breath (occ) and wheezing (asthma).   Cardiovascular:  Positive for leg swelling (chronic - controlled). Negative for chest pain and palpitations.  Gastrointestinal:        Thalia sprinkle - maybe once a week  Neurological:  Positive for numbness (tingling in fingers and toes). Negative for light-headedness and headaches.       Objective:   Vitals:   01/14/24 0954  BP: 118/80  Pulse: 89  Temp: 98.1 F (36.7 C)  SpO2: 95%   BP Readings from Last 3 Encounters:  01/14/24 118/80  01/03/24 132/78  09/30/23 127/80   Wt Readings from Last 3 Encounters:  01/14/24 (!) 318 lb (144.2 kg)  01/03/24 (!) 315 lb (142.9 kg)  09/30/23 (!) 313 lb (142 kg)   Body mass index is 58.16 kg/m.    Physical Exam Constitutional:      General: She is not in acute distress.    Appearance: Normal appearance.  HENT:     Head: Normocephalic and atraumatic.  Eyes:     Conjunctiva/sclera: Conjunctivae normal.  Cardiovascular:     Rate and Rhythm: Normal rate and regular rhythm.     Heart sounds: Normal heart sounds.  Pulmonary:     Effort: Pulmonary effort is normal. No respiratory distress.     Breath sounds: Normal breath sounds. No wheezing.  Musculoskeletal:     Cervical back: Neck supple.     Right lower leg: Edema (Mild with some thickening of the skin) present.     Left lower leg: Edema  (Mild with some thickening of the skin) present.  Lymphadenopathy:     Cervical: No cervical adenopathy.  Skin:    General: Skin is warm and dry.     Findings: No rash.  Neurological:     Mental Status: She is alert. Mental status is at baseline.  Psychiatric:        Mood and Affect: Mood normal.        Behavior: Behavior normal.        Lab Results  Component Value Date   WBC 7.0 08/13/2023   HGB 12.7 08/13/2023   HCT 39.2 08/13/2023   PLT 141 (L) 08/13/2023   GLUCOSE 92 01/03/2024   CHOL 143 01/03/2024   TRIG 53 01/03/2024   HDL 39 (L) 01/03/2024   LDLDIRECT 169.4 05/02/2007   LDLCALC 93 01/03/2024   ALT 19 08/13/2023   AST 21 08/13/2023   NA 141 01/03/2024   K 4.2 01/03/2024   CL 98 01/03/2024   CREATININE 1.92 (H) 01/03/2024   BUN 18 01/03/2024   CO2 34 (H) 01/03/2024   TSH 4.317 11/15/2020   INR 1.0 07/04/2006   HGBA1C 6.1 07/09/2023   MICROALBUR 1.7 03/25/2009     Assessment & Plan:    See Problem  List for Assessment and Plan of chronic medical problems.       [1]  Current Outpatient Medications on File Prior to Visit  Medication Sig Dispense Refill   acetaminophen  (TYLENOL ) 650 MG CR tablet Take 1,300 mg by mouth every 8 (eight) hours as needed for pain.     Alcohol Swabs (DROPSAFE ALCOHOL PREP) 70 % PADS USE TOPICALLY AS DIRECTED 300 each 3   allopurinol  (ZYLOPRIM ) 100 MG tablet Take 1 tablet (100 mg total) by mouth 2 (two) times daily. 180 tablet 1   atorvastatin  (LIPITOR) 40 MG tablet TAKE 1 TABLET(40 MG) BY MOUTH DAILY 90 tablet 2   azelastine  (ASTELIN ) 0.1 % nasal spray Place 2 sprays into both nostrils 2 (two) times daily. Use in each nostril as directed 30 mL 5   benzonatate  (TESSALON ) 200 MG capsule Take 1 capsule (200 mg total) by mouth 2 (two) times daily as needed for cough. 60 capsule 2   Blood Glucose Calibration (TRUE METRIX LEVEL 1) Low SOLN UAD  E11.9 1 each 3   Blood Glucose Monitoring Suppl (TRUE METRIX METER) DEVI True Metrix Level  1 solution     budesonide -glycopyrrolate -formoterol  (BREZTRI  AEROSPHERE) 160-9-4.8 MCG/ACT AERO inhaler Inhale 2 puffs into the lungs in the morning and at bedtime. 3 each 3   Calcium  Carb-Cholecalciferol  (OYSTER SHELL CALCIUM  250+D) 250-3.125 MG-MCG TABS Take 1 tablet by mouth daily at 2 PM.     calcium  carbonate (TUMS - DOSED IN MG ELEMENTAL CALCIUM ) 500 MG chewable tablet Chew 2 tablets by mouth daily as needed for indigestion or heartburn.     cyanocobalamin  (,VITAMIN B-12,) 1000 MCG/ML injection INJECT INTO MUSCLE EVERY 30 DAYS 10 mL 5   dapagliflozin  propanediol (FARXIGA ) 10 MG TABS tablet Take 10 mg by mouth daily.     diclofenac  Sodium (VOLTAREN ) 1 % GEL Apply 1 application topically as needed (pain).     diphenhydrAMINE  (BENADRYL ) 12.5 MG/5ML liquid Take 25 mg by mouth 4 (four) times daily as needed for allergies.     ELIQUIS  2.5 MG TABS tablet TAKE 1 TABLET BY MOUTH TWICE DAILY 180 tablet 1   famotidine  (PEPCID ) 40 MG tablet Take 1 tablet (40 mg total) by mouth 2 (two) times daily. 180 tablet 3   ferrous sulfate  (FEROSUL) 325 (65 FE) MG tablet Take 1 tablet (325 mg total) by mouth 3 (three) times daily with meals. 60 tablet 0   fluconazole (DIFLUCAN) 150 MG tablet Take 150 mg by mouth every 3 (three) days.     fluticasone  (FLONASE ) 50 MCG/ACT nasal spray Place 2 sprays into both nostrils daily. 18.2 mL 2   gabapentin  (NEURONTIN ) 100 MG capsule TAKE 1 CAPSULE(100 MG) BY MOUTH THREE TIMES DAILY 90 capsule 1   hydrOXYzine  (ATARAX ) 25 MG tablet Take 1 tablet (25 mg total) by mouth 3 (three) times daily as needed for itching. 90 tablet 1   metroNIDAZOLE (FLAGYL) 500 MG tablet Take 500 mg by mouth 2 (two) times daily.     montelukast  (SINGULAIR ) 10 MG tablet TAKE 1 TABLET(10 MG) BY MOUTH AT BEDTIME 90 tablet 1   nebivolol  (BYSTOLIC ) 5 MG tablet TAKE 1 TABLET(5 MG) BY MOUTH DAILY 90 tablet 1   Oxcarbazepine  (TRILEPTAL ) 300 MG tablet TAKE 1 TABLET(300 MG) BY MOUTH TWICE DAILY 180 tablet 1    oxyCODONE  (OXY IR/ROXICODONE ) 5 MG immediate release tablet Take 1 tablet (5 mg total) by mouth every 6 (six) hours as needed for severe pain (pain score 7-10) (Chronic knee osteoarthritis). 20 tablet  0   polyethylene glycol (MIRALAX  / GLYCOLAX ) 17 g packet Take 17 g by mouth daily as needed for moderate constipation.     potassium chloride  SA (KLOR-CON  M) 20 MEQ tablet TAKE 1 TABLET DAILY AND 3 TABLETS EVERY WEDNESDAY 102 tablet 3   Spacer/Aero-Holding Raguel FRENCH Use with inhaler. 1 each 2   spironolactone  (ALDACTONE ) 25 MG tablet TAKE 1 TABLET(25 MG) BY MOUTH DAILY 90 tablet 1   tirzepatide  (MOUNJARO ) 15 MG/0.5ML Pen INJECT 15MG  (1 PEN) UNDER THE SKIN EVERY WEEK 6 mL 3   torsemide  (DEMADEX ) 20 MG tablet TAKE 2 TABLETS(40 MG) BY MOUTH TWICE DAILY 360 tablet 3   triamcinolone  ointment (KENALOG ) 0.5 % Apply 1 Application topically as needed (knee pain).     UNABLE TO FIND Med Name: Oxygen  portable at home 2-liters as needed     VENTOLIN  HFA 108 (90 Base) MCG/ACT inhaler Inhale 2 puffs into the lungs every 4 (four) hours as needed for wheezing.     No current facility-administered medications on file prior to visit.

## 2024-01-13 NOTE — Patient Instructions (Addendum)
° ° ° ° °  Blood work was ordered.       Medications changes include :   None     Return in about 6 months (around 07/14/2024) for Physical Exam.  and weight. Your health care provider can help determine your BMI and help you achieve or maintain a healthy weight. Get regular exercise Get regular exercise. This is one of the most important things you can do for your health. Most adults should: Exercise for at least 150 minutes each week. The exercise should increase your heart rate and make you sweat (moderate-intensity exercise). Do strengthening exercises at least twice a week. This is in addition to the moderate-intensity exercise. Spend less time sitting. Even light physical activity can be beneficial. Watch cholesterol and blood lipids Have your blood tested for lipids and cholesterol at 73 years of age, then have this test every 5  years. Have your cholesterol levels checked more often if: Your lipid or cholesterol levels are high. You are older than 73 years of age. You are at high risk for heart disease. What should I know about cancer screening? Depending on your health history and family history, you may need to have cancer screening at various ages. This may include screening for: Breast cancer. Cervical cancer. Colorectal cancer. Skin cancer. Lung cancer. What should I know about heart disease, diabetes, and high blood pressure? Blood pressure and heart disease High blood pressure causes heart disease and increases the risk of stroke. This is more likely to develop in people who have high blood pressure readings or are overweight. Have your blood pressure checked: Every 3-5 years if you are 44-57 years of age. Every year if you are 44 years old or older. Diabetes Have regular diabetes screenings. This checks your fasting blood sugar level. Have the screening done: Once every three years after age 5 if you are at a normal weight and have a low risk for diabetes. More often and at a younger age if you are overweight or have a high risk for diabetes. What should I know about preventing infection? Hepatitis B If you have a higher risk for hepatitis B, you should be screened for this virus. Talk with your health care provider to find out if you are at risk for hepatitis B infection. Hepatitis C Testing is recommended for: Everyone born from [birth year] through 1965. Anyone with known risk factors for hepatitis C. Sexually transmitted infections (STIs) Get screened for STIs, including gonorrhea and chlamydia, if: You are sexually active and are younger than 73 years of age. You are older than 73 years of age and your health care provider tells you that you are at risk for this type of infection. Your sexual activity has changed since you were last screened, and you are at increased risk for chlamydia or gonorrhea.  Ask your health care provider if you are at risk. Ask your health care provider about whether you are at high risk for HIV. Your health care provider may recommend a prescription medicine to help prevent HIV infection. If you choose to take medicine to prevent HIV, you should first get tested for HIV. You should then be tested every 3 months for as long as you are taking the medicine. Pregnancy If you are about to stop having your period (premenopausal) and you may become pregnant, seek counseling before you get pregnant. Take 400 to 800 micrograms (mcg) of folic acid  every day if you become pregnant. Ask for birth control (contraception) if you want to prevent pregnancy. Osteoporosis and menopause Osteoporosis is a disease in which the bones lose minerals and strength with aging. This can result in bone fractures. If you are 67 years old or older, or if  you are at risk for osteoporosis and fractures, ask your health care provider if you should: Be screened for bone loss. Take a calcium  or vitamin D  supplement to lower your risk of fractures. Be given hormone replacement therapy (HRT) to treat symptoms of menopause. Follow these instructions at home: Alcohol use Do not drink alcohol if: Your health care provider tells you not to drink. You are pregnant, may be pregnant, or are planning to become pregnant. If you drink alcohol: Limit how much you have to: 0-1 drink a day. Know how much alcohol is in your drink. In the [place] one drink equals one 12 oz bottle of beer (355 mL), one 5 oz glass of wine (148 mL), or one 1 oz glass of hard liquor (44 mL). Lifestyle Do not use any products that contain nicotine or tobacco. These products include cigarettes, chewing tobacco, and vaping devices, such as e-cigarettes. If you need help quitting, ask your health care provider. Do not use street drugs. Do not share needles. Ask your health care provider for help if you need support or information about  quitting drugs. General instructions Schedule regular health, dental, and eye exams. Stay current with your vaccines. Tell your health care provider if: You often feel depressed. You have ever been abused or do not feel safe at home. Summary Adopting a healthy lifestyle and getting preventive care are important in promoting health and wellness. Follow your health care provider's instructions about healthy diet, exercising, and getting tested or screened for diseases. Follow your health care provider's instructions on monitoring your cholesterol and blood pressure. This information is not intended to replace advice given to you by your health care provider. Make sure you discuss any questions you have with your health care provider. Document Revised: 06/10/2020 Document Reviewed: 06/10/2020 Elsevier Patient Education  2024 [employer].

## 2024-01-14 ENCOUNTER — Ambulatory Visit: Admitting: Internal Medicine

## 2024-01-14 VITALS — BP 118/80 | HR 89 | Temp 98.1°F | Ht 62.0 in | Wt 318.0 lb

## 2024-01-14 DIAGNOSIS — K219 Gastro-esophageal reflux disease without esophagitis: Secondary | ICD-10-CM

## 2024-01-14 DIAGNOSIS — R600 Localized edema: Secondary | ICD-10-CM

## 2024-01-14 DIAGNOSIS — E1122 Type 2 diabetes mellitus with diabetic chronic kidney disease: Secondary | ICD-10-CM

## 2024-01-14 DIAGNOSIS — E1159 Type 2 diabetes mellitus with other circulatory complications: Secondary | ICD-10-CM

## 2024-01-14 DIAGNOSIS — E538 Deficiency of other specified B group vitamins: Secondary | ICD-10-CM

## 2024-01-14 DIAGNOSIS — G609 Hereditary and idiopathic neuropathy, unspecified: Secondary | ICD-10-CM

## 2024-01-14 DIAGNOSIS — I2510 Atherosclerotic heart disease of native coronary artery without angina pectoris: Secondary | ICD-10-CM

## 2024-01-14 DIAGNOSIS — E1169 Type 2 diabetes mellitus with other specified complication: Secondary | ICD-10-CM

## 2024-01-14 DIAGNOSIS — M109 Gout, unspecified: Secondary | ICD-10-CM

## 2024-01-14 DIAGNOSIS — R252 Cramp and spasm: Secondary | ICD-10-CM

## 2024-01-14 DIAGNOSIS — M170 Bilateral primary osteoarthritis of knee: Secondary | ICD-10-CM

## 2024-01-14 DIAGNOSIS — N184 Chronic kidney disease, stage 4 (severe): Secondary | ICD-10-CM

## 2024-01-14 DIAGNOSIS — I5032 Chronic diastolic (congestive) heart failure: Secondary | ICD-10-CM

## 2024-01-14 LAB — CBC
HCT: 40.2 % (ref 36.0–46.0)
Hemoglobin: 13 g/dL (ref 12.0–15.0)
MCHC: 32.4 g/dL (ref 30.0–36.0)
MCV: 98.6 fl (ref 78.0–100.0)
Platelets: 150 K/uL (ref 150.0–400.0)
RBC: 4.08 Mil/uL (ref 3.87–5.11)
RDW: 16.4 % — ABNORMAL HIGH (ref 11.5–15.5)
WBC: 5.5 K/uL (ref 4.0–10.5)

## 2024-01-14 LAB — COMPREHENSIVE METABOLIC PANEL WITH GFR
ALT: 14 U/L (ref 0–35)
AST: 18 U/L (ref 0–37)
Albumin: 3.9 g/dL (ref 3.5–5.2)
Alkaline Phosphatase: 96 U/L (ref 39–117)
BUN: 19 mg/dL (ref 6–23)
CO2: 35 meq/L — ABNORMAL HIGH (ref 19–32)
Calcium: 9.4 mg/dL (ref 8.4–10.5)
Chloride: 99 meq/L (ref 96–112)
Creatinine, Ser: 1.98 mg/dL — ABNORMAL HIGH (ref 0.40–1.20)
GFR: 24.61 mL/min — ABNORMAL LOW (ref >60.00)
Glucose, Bld: 85 mg/dL (ref 70–99)
Potassium: 4.3 meq/L (ref 3.5–5.1)
Sodium: 142 meq/L (ref 135–145)
Total Bilirubin: 0.7 mg/dL (ref 0.2–1.2)
Total Protein: 7.1 g/dL (ref 6.0–8.3)

## 2024-01-14 LAB — MAGNESIUM: Magnesium: 2.4 mg/dL (ref 1.5–2.5)

## 2024-01-14 LAB — TSH: TSH: 2.33 u[IU]/mL (ref 0.35–5.50)

## 2024-01-14 LAB — HEMOGLOBIN A1C: Hgb A1c MFr Bld: 5.4 % (ref 4.6–6.5)

## 2024-01-14 NOTE — Assessment & Plan Note (Signed)
 Chronic Associated with CKD stage normal 4, obesity, hypertension, hyperlipidemia  Lab Results  Component Value Date   HGBA1C 6.1 07/09/2023   Sugars well controlled Check A1c Continue Farxiga  10 mg daily, Mounjaro  15 mg weekly Stressed regular exercise, diabetic diet

## 2024-01-14 NOTE — Assessment & Plan Note (Signed)
 Chronic No chest pain, but relatively inactive Continue current medications

## 2024-01-14 NOTE — Assessment & Plan Note (Signed)
 Chronic Blood pressure controlled CBC, CMP Continue Bystolic  5 mg daily, spironolactone  25 mg daily, torsemide  40 mg twice daily

## 2024-01-14 NOTE — Assessment & Plan Note (Signed)
 Chronic Encouraged as much exercise as possible Healthy diet encouraged Check lipid panel  Continue atorvastatin  40 mg daily

## 2024-01-14 NOTE — Assessment & Plan Note (Addendum)
 Chronic BMI 58.16 Currently on Mounjaro  15 mg weekly Encourage as much activity as possible Healthy diet, small portions, low in sugars and carbs

## 2024-01-14 NOTE — Assessment & Plan Note (Addendum)
 Chronic Controlled Not able to find a pair of compression socks that are effective for her Encouraged as much activity as possible CMP Continue torsemide  40 mg twice daily  Continue spironolactone  25 mg daily

## 2024-01-14 NOTE — Assessment & Plan Note (Signed)
 Chronic Appears euvolemic-has chronic leg swelling Continue torsemide  40 mg twice daily, spironolactone  25 mg daily, Farxiga  10 mg daily CMP Stressed low-sodium diet

## 2024-01-14 NOTE — Assessment & Plan Note (Signed)
 Chronic GERD controlled Continue famotidine  to 40 mg twice daily, Tums prn

## 2024-01-14 NOTE — Assessment & Plan Note (Signed)
 Chronic Stage IV Following with Dr. Gearline Continue Farxiga  10 mg daily, torsemide  40 mg twice daily, spironolactone  25 mg daily CBC, CMP Continue weight loss efforts

## 2024-01-14 NOTE — Assessment & Plan Note (Signed)
Chronic Continue gabapentin 100 mg 3 times daily, Trileptal 300 mg twice daily

## 2024-01-14 NOTE — Assessment & Plan Note (Signed)
Chronic Bilateral severe knee osteoarthritis with chronic pain Continue oxycodone 5 mg every 6 hours as needed-she does not take this often 

## 2024-01-14 NOTE — Assessment & Plan Note (Signed)
 Chronic Nocturnal and daytime leg cramping laterally States she drinks enough water May be related to her diuretics Will check CMP, magnesium  Can consider a muscle relaxer at nighttime if no causes are found

## 2024-01-14 NOTE — Assessment & Plan Note (Signed)
Chronic ?Continue monthly B12 injections ? ?

## 2024-01-14 NOTE — Assessment & Plan Note (Signed)
Chronic Denies gout flares Continue allopurinol 200 mg daily

## 2024-01-15 ENCOUNTER — Ambulatory Visit: Payer: Self-pay | Admitting: Internal Medicine

## 2024-01-17 ENCOUNTER — Other Ambulatory Visit: Payer: Self-pay | Admitting: Internal Medicine

## 2024-01-17 MED ORDER — MONTELUKAST SODIUM 10 MG PO TABS: ORAL_TABLET | ORAL | 1 refills | Status: AC

## 2024-01-17 MED ORDER — SPIRONOLACTONE 25 MG PO TABS: ORAL_TABLET | ORAL | 1 refills | Status: AC

## 2024-01-17 MED ORDER — OXCARBAZEPINE 300 MG PO TABS: ORAL_TABLET | ORAL | 1 refills | Status: AC

## 2024-01-17 NOTE — Telephone Encounter (Unsigned)
 Copied from CRM #[id number]. Topic: Clinical - Medication Refill >> Jan 17, 2024 10:39 AM Deleta S wrote: Medication: montelukast  (SINGULAIR ) 10 MG tablet , Oxcarbazepine  (TRILEPTAL ) 300 MG tablet,  spironolactone  (ALDACTONE ) 25 MG table 90 day supply  Has the patient contacted their pharmacy? Yes (Agent: If no, request that the patient contact the pharmacy for the refill. If patient does not wish to contact the pharmacy document the reason why and proceed with request.) (Agent: If yes, when and what did the pharmacy advise?)  This is the patient's preferred pharmacy:  [facility] [address] [place] - [address] [place] [address] Phone: [phone] Fax: [phone]   Is this the correct pharmacy for this prescription? Yes If no, delete pharmacy and type the correct one.   Has the prescription been filled recently? Yes  Is the patient out of the medication? Yes  Has the patient been seen for an appointment in the last year OR does the patient have an upcoming appointment? Yes  Can we respond through MyChart? No  Agent: Please be advised that Rx refills may take up to 3 business days. We ask that you follow-up with your pharmacy.

## 2024-01-21 ENCOUNTER — Ambulatory Visit

## 2024-01-21 DIAGNOSIS — E538 Deficiency of other specified B group vitamins: Secondary | ICD-10-CM

## 2024-01-21 MED ORDER — CYANOCOBALAMIN 1000 MCG/ML IJ SOLN
1000.0000 ug | Freq: Once | INTRAMUSCULAR | Status: AC
  Administered 2024-01-21: 1000 ug via INTRAMUSCULAR

## 2024-01-21 NOTE — Progress Notes (Signed)
 Patient visits today for their b-12 injection. Patient informed of what they had received and tolerated the injection well. Patient notified to reach out to the office if needed.

## 2024-01-24 ENCOUNTER — Other Ambulatory Visit: Payer: Self-pay | Admitting: Internal Medicine

## 2024-01-24 NOTE — Telephone Encounter (Unsigned)
 Copied from CRM #[id number]. Topic: Clinical - Medication Refill >> Jan 24, 2024 11:11 AM Alexandria E wrote: Medication: oxyCODONE  (OXY IR/ROXICODONE ) 5 MG immediate release tablet  Has the patient contacted their pharmacy? No (Agent: If no, request that the patient contact the pharmacy for the refill. If patient does not wish to contact the pharmacy document the reason why and proceed with request.) (Agent: If yes, when and what did the pharmacy advise?)  This is the patient's preferred pharmacy:  [facility] [address] [place] - [address] [place] [address] Phone: [phone] Fax: [phone]   Is this the correct pharmacy for this prescription? Yes If no, delete pharmacy and type the correct one.   Has the prescription been filled recently? No  Is the patient out of the medication? No  Has the patient been seen for an appointment in the last year OR does the patient have an upcoming appointment? Yes  Can we respond through MyChart? Yes  Agent: Please be advised that Rx refills may take up to 3 business days. We ask that you follow-up with your pharmacy.

## 2024-01-25 MED ORDER — OXYCODONE HCL 5 MG PO TABS: 5.0000 mg | ORAL_TABLET | Freq: Four times a day (QID) | ORAL | 0 refills | Status: AC | PRN

## 2024-01-31 ENCOUNTER — Other Ambulatory Visit: Payer: Self-pay | Admitting: Internal Medicine

## 2024-02-02 ENCOUNTER — Encounter: Payer: Self-pay | Admitting: *Deleted

## 2024-02-04 ENCOUNTER — Telehealth: Payer: Self-pay

## 2024-02-04 NOTE — Telephone Encounter (Signed)
 Copied from CRM [id number]. Topic: Clinical - Medical Advice >> Feb 04, 2024  2:01 PM Donna BRAVO wrote: Reason for CRM: the hospital informed patient to call provider MRI on  02/10/24 and will be sedated by anesthesiologist.The anesthesiologist asking Dr Glade Hope MD would like to know if  patient should stop taking ELIQUIS  2.5 MG TABS tablet   Please call patient with information [phone]  and is an urgent matter >> Feb 04, 2024  2:07 PM Donna BRAVO wrote:

## 2024-02-07 ENCOUNTER — Other Ambulatory Visit: Payer: Self-pay | Admitting: Internal Medicine

## 2024-02-07 NOTE — Telephone Encounter (Signed)
 I do not think the eliquis  needs to be stopped - she should double check with Dr Carolee but I do not think a biopsy is planned which is when it would have to be stopped

## 2024-02-07 NOTE — Telephone Encounter (Signed)
 Copied from CRM [id number]. Topic: Clinical - Medication Refill >> Feb 07, 2024 11:31 AM Deleta RAMAN wrote: Medication: nebivolol  (BYSTOLIC ) 5 MG tablet  Has the patient contacted their pharmacy? Yes (Agent: If no, request that the patient contact the pharmacy for the refill. If patient does not wish to contact the pharmacy document the reason why and proceed with request.) (Agent: If yes, when and what did the pharmacy advise?)  This is the patient's preferred pharmacy:  [facility] [address],  - [address] [place] [address] Phone: [phone] Fax: [phone]    Is this the correct pharmacy for this prescription? Yes If no, delete pharmacy and type the correct one.   Has the prescription been filled recently? Yes  Is the patient out of the medication? No  Has the patient been seen for an appointment in the last year OR does the patient have an upcoming appointment? Yes  Can we respond through MyChart? No  Agent: Please be advised that Rx refills may take up to 3 business days. We ask that you follow-up with your pharmacy.

## 2024-02-08 ENCOUNTER — Other Ambulatory Visit: Payer: Self-pay

## 2024-02-08 ENCOUNTER — Encounter (HOSPITAL_COMMUNITY): Payer: Self-pay | Admitting: *Deleted

## 2024-02-08 MED ORDER — NEBIVOLOL HCL 5 MG PO TABS: ORAL_TABLET | ORAL | 1 refills | Status: AC

## 2024-02-08 NOTE — Telephone Encounter (Signed)
 Spoke with patient today.

## 2024-02-08 NOTE — Progress Notes (Signed)
 SDW CALL  Patient was given pre-op instructions over the phone. The opportunity was given for the patient to ask questions. No further questions asked. Patient verbalized understanding of instructions given.   PCP - Glade Geofm COME Cardiologist - Dr. Cherrie COME Nephrologist - Dr. Gearline  PPM/ICD - denies Device Orders -  Rep Notified -   Chest x-ray - 10/07/22 EKG - 01/03/24 Stress Test -03/25/06  ECHO - 04/03/22 Cardiac Cath - 11/19/20  Sleep Study - 12/10/19 CPAP - uses it every now and then  Fasting Blood Sugar - patient does not check it at home and does have a meter.  Checks Blood Sugar _____ times a day  Blood Thinner Instructions: continue Eliquis   Aspirin  Instructions:na   ERAS Protcol - clears until 0500 PRE-SURGERY Ensure or G2-   COVID TEST- na   Anesthesia review: yes-Hx-HTN,HLD,DM,CAD,CHF  Patient denies shortness of breath, fever, cough and chest pain over the phone call   Special instructions:    Oral Hygiene is also important to reduce your risk of infection.  Remember - BRUSH YOUR TEETH THE MORNING OF SURGERY WITH YOUR REGULAR TOOTHPASTE

## 2024-02-09 NOTE — Progress Notes (Signed)
 Anesthesia Chart Review: April [name]  Case: [id number] Date/Time: 02/10/24 0745   Procedure: MRI WITH ANESTHESIA (Left)   Anesthesia type: General   Pre-op diagnosis: Neoplasm of unspecified behavior left kidney   [facility]   Surgeons: Radiologist, Medication, MD       DISCUSSION: Patient is a 74 year old female scheduled for MR abdomen MRCP on 02/10/2024. She had an left kidney lesion noted on July 2025 CT of undetermined significance.  MRI was ordered by Dr. Carolee. She has PCP Progress Note for follow-up chronic medical conditions, done within past 30 days, 01/14/2024.   History includes never smoker, HTN, HLD, CAD (mild-moderate 03/2006), chronic diastolic CHF, hypertension (mild to moderate by RHC 11/2020), DM2, peripheral neuropathy, asthma, anemia, exertional dyspnea, OSA (intermittent use of BiPAP), asthma, GERD, CKD,  morbid obesity, chronic RLE ulceration (s/p I&D, STSG, wound vac 2020), DVT (BLE age-indeterminate DVT 05/08/2020).  Last HF visit was on 01/03/2024 with Hayes Lander, NP. 03/2006 LHC showed 50-60% LAD stenosis after the D1 and 40% mid LAD after the D2, normal LCX, 30% mid RCA and proximal PDA. Last RHC 03/2020 showed mild to moderate pulmonary hypertension, likely due to OSA/OHS. 12/2020 sleep study showed no significant OSA but nocturnal desaturations to 84%. Later prescribed BiPAP at night and 2L O2 as needed during the day. Echo 04/2022 showed LVEF 65-70%, no RWMA, mild LVH, grade 1 DD, mild RVH, normal RV systolic function, trivial MR. Chronic LE edema. On Eliquis  since BLE in 2022. Meds continued. 9 month follow-up with echo planned.   Follows with nephrology for CKD, slowly progressive and likely due to HTN and DM. Cr ~ 1.7-2.1 since 2024.   She had labs including CBC, TSH, A1c, CMP, Mg at recent PCP visit on 01/14/2024. A1c 5.4%, H/H 13.0/40.2, PLT 150, CO2 35, BUN 19, Cr 1.98, AST 18, ALT 14.  Anesthesia team to evaluate on the day of surgery.  Per medication list, her Mounjaro  has been on hold for procedure.    VS:  Wt Readings from Last 3 Encounters:  01/14/24 (!) 144.2 kg  01/03/24 (!) 142.9 kg  09/30/23 (!) 142 kg   BP Readings from Last 3 Encounters:  01/14/24 118/80  01/03/24 132/78  09/30/23 127/80   Pulse Readings from Last 3 Encounters:  01/14/24 89  01/03/24 92  09/30/23 82     PROVIDERS: Geofm Glade PARAS, MD is PCP  Carolee Sherwood MOULD, MD is urologist Gearline Norris, MD is nephrologist. 12/08/2023 office note scanned under Media tab.  Cherrie Sieving, MD is HF cardiologist Federico Norleen CLORE, MD is CHERIE Neda Hammond, MD is pulmonologist   LABS: Most recent lab results in [facility] include: Lab Results  Component Value Date   WBC 5.5 01/14/2024   HGB 13.0 01/14/2024   HCT 40.2 01/14/2024   PLT 150.0 01/14/2024   GLUCOSE 85 01/14/2024   ALT 14 01/14/2024   AST 18 01/14/2024   NA 142 01/14/2024   K 4.3 01/14/2024   CL 99 01/14/2024   CREATININE 1.98 (H) 01/14/2024   BUN 19 01/14/2024   CO2 35 (H) 01/14/2024   TSH 2.33 01/14/2024   HGBA1C 5.4 01/14/2024    Sleep Study 12/06/2020: IMPRESSIONS - No Significant Obstructive Sleep apnea(OSA) - EKG showed no cardiac abnormalities. - Mild Oxygen  Desaturation,required 1L oxygen  supplementation - The patient snored with soft snoring volume. - No significant periodic leg movements(PLMs) during sleep. However, no significant associated arousals.    IMAGES: CT renal stone  study 08/25/2023: IMPRESSION: 1. No acute intra-abdominal or pelvic pathology. 2. Nonobstructing bilateral renal calculi. No hydronephrosis. 3. Moderate colonic stool burden. No bowel obstruction. Normal appendix. 4.  Aortic Atherosclerosis (ICD10-I70.0). - There is a 2 cm indeterminate high attenuating lesion in the superior pole of the left kidney which is not characterized on this CT but may represent a proteinaceous/hemorrhagic cyst. Additional 1 cm partially exophytic lesion  from the medial upper pole of the right kidney also not characterized. Further evaluation of the renal lesions with ultrasound on a nonemergent/outpatient basis recommended.    EKG: 01/03/2024: Normal sinus rhythm Prolonged QT Abnormal ECG When compared with ECG of 04-Dec-2022 10:47, No significant change was found Confirmed by Elmira Penman [phone]) on 01/04/2024 5:28:27 AM   CV: Echo 04/03/2022: IMPRESSIONS   1. Left ventricular ejection fraction, by estimation, is 65 to 70%. Left  ventricular ejection fraction by PLAX is 68 %. The left ventricle has  normal function. The left ventricle has no regional wall motion  abnormalities. There is mild left ventricular  hypertrophy. Left ventricular diastolic parameters are consistent with  Grade I diastolic dysfunction (impaired relaxation).   2. Right ventricular systolic function is normal. The right ventricular  size is mildly enlarged. Tricuspid regurgitation signal is inadequate for  assessing PA pressure.   3. Left atrial size was mildly dilated.   4. The mitral valve is abnormal. Trivial mitral valve regurgitation.   5. The aortic valve is tricuspid. Aortic valve regurgitation is not  visualized. Aortic valve sclerosis/calcification is present, without any  evidence of aortic stenosis.   6. The inferior vena cava is normal in size with greater than 50%  respiratory variability, suggesting right atrial pressure of 3 mmHg.  - Comparison(s): No significant change from prior study. 11/18/2020: LVEF  65-70%.    RHC 11/19/2020: Findings: RA = 11 RV = 68/12 PA = 69/22 (39) PCW = 18 Fick cardiac output/index = 7.0/2.8 PVR = 3.0 WU Ao sat = 96% PA sat = 61%, 62% SVC sat 69% PAPi = 4.2   Assessment: 1. Mild to moderate pulmonary HTN 2. Minimally elevated PCWP 3. Normal cardiac output   Plan/Discussion: Volume status looks good. Main issue likely OSA/OHS.     LHC 03/30/2006: CORONARY ANATOMY:   - Left main normal. - LAD  was a long vessel coursing to the apex, gave off two diagonals. There was a 50-60% stenosis in the LAD just after the first diagonal and a 40% stenosis in the mid LAD just after the second diagonal. - The left circumflex was a moderate-size system.  It gave off a small ramus and a large branching OM-1.  It is angiographically normal. - Right coronary artery was a dominant vessel that gave off a large RV branch. There was a dual PDA system with one small and one moderate-size vessel and too small posterolaterals.  There was a 30% stenosis in the mid RCA and 30% stenosis in the proximal portion of the larger PDA branches. - Left ventriculogram done in the RAO position showed an EF of 70% with no wall motion abnormalities or mitral regurgitation. - Abdominal aortogram showed patent renal arteries bilaterally with no evidence of abdominal aortic aneurysm.   Past Medical History:  Diagnosis Date   Anemia    Asthma    CAD (coronary artery disease)    Carpal tunnel syndrome    Cellulitis of both lower extremities 04/11/2015   CHF (congestive heart failure) (HCC)    Chronic kidney disease  [facility]- followed by [facility]   Colon polyp, hyperplastic 2007 & 2012   Complication of anesthesia 1999   svt with renal calculi surgery, no problems since   CTS (carpal tunnel syndrome)    bilateral   Diabetes mellitus    Eczema    GERD (gastroesophageal reflux disease)    History of kidney stones 1999   Hyperlipidemia    Hypertension    Leg ulcer (HCC) 04/24/2015   Right lateral leg No evidence of an infection Monitor closely Keep edema controlled    Leg ulcer (HCC)    right lower   Meralgia paresthetica    Dr. Onita   Morbid obesity (HCC)    Neuropathy    toes and legs   Osteoarthrosis, unspecified whether generalized or localized, lower leg    knee   PUD (peptic ulcer disease)    Shortness of breath dyspnea    with exertion   Sleep apnea    per progress note 02/25/2018   Type II or  unspecified type diabetes mellitus without mention of complication, not stated as uncontrolled    Unspecified hereditary and idiopathic peripheral neuropathy    Urticaria    Vitamin B12 deficiency    Wears glasses    Wound cellulitis    right upper leg, healing well    Past Surgical History:  Procedure Laterality Date   ABDOMINAL HYSTERECTOMY     BIOPSY  03/20/2021   Procedure: BIOPSY;  Surgeon: Albertus Gordy HERO, MD;  Location: [facility];  Service: Gastroenterology;;   BREAST BIOPSY Right 07/08/2018   CARDIAC CATHETERIZATION  2002   non obstructive disease   colonoscopy with polypectomy  2007 & 2012    hyperplastic ;Dr Rosalie   COLONOSCOPY WITH PROPOFOL  N/A 06/04/2015   Procedure: COLONOSCOPY WITH PROPOFOL ;  Surgeon: Gordy HERO Albertus, MD;  Location: [facility];  Service: Gastroenterology;  Laterality: N/A;   COLONOSCOPY WITH PROPOFOL  N/A 03/20/2021   Procedure: COLONOSCOPY WITH PROPOFOL ;  Surgeon: Albertus Gordy HERO, MD;  Location: [facility];  Service: Gastroenterology;  Laterality: N/A;   DEBRIDEMENT LEG Right 03/02/2018   WOUND VAC APPLIED   DEBRIDEMENT LEG Right 05/27/2018   RIGHT LOWER LEG DEBRIDEMENT,  SKIN GRAFT, VAC PLACEMENT    DILATION AND CURETTAGE OF UTERUS     multiple   ESOPHAGOGASTRODUODENOSCOPY (EGD) WITH PROPOFOL  N/A 03/20/2021   Procedure: ESOPHAGOGASTRODUODENOSCOPY (EGD) WITH PROPOFOL ;  Surgeon: Albertus Gordy HERO, MD;  Location: [facility];  Service: Gastroenterology;  Laterality: N/A;   HEMORRHOID SURGERY     HEMORROIDECTOMY     I & D EXTREMITY Right 03/02/2018   Procedure: RIGHT LEG DEBRIDEMENT AND PLACE VAC;  Surgeon: Harden Jerona GAILS, MD;  Location: [facility];  Service: Orthopedics;  Laterality: Right;   I & D EXTREMITY Right 03/04/2018   Procedure: REPEAT IRRIGATION AND DEBRIDEMENT RIGHT LEG, PLACE WOUND VAC;  Surgeon: Harden Jerona GAILS, MD;  Location: [facility];  Service: Orthopedics;  Laterality: Right;   I & D EXTREMITY Right 03/30/2018   Procedure: IRRIGATION AND DEBRIDEMENT  RIGHT LEG, APPLY WOUND VAC;  Surgeon: Harden Jerona GAILS, MD;  Location: [facility];  Service: Orthopedics;  Laterality: Right;   I & D EXTREMITY Right 05/27/2018   Procedure: RIGHT LOWER LEG DEBRIDEMENT,  SKIN GRAFT, VAC PLACEMENT;  Surgeon: Harden Jerona GAILS, MD;  Location[facility];  Service: Orthopedics;  Laterality: Right;  RIGHT LOWER LEG DEBRIDEMENT,  SKIN GRAFT, VAC PLACEMENT   POLYPECTOMY  03/20/2021   Procedure: POLYPECTOMY;  Surgeon: Albertus Gordy HERO,  MD;  Location: [facility];  Service: Gastroenterology;;   renal calculi  12/1997   SVT with induction of anesthesia   RIGHT HEART CATH N/A 11/19/2020   Procedure: RIGHT HEART CATH;  Surgeon: Cherrie Toribio SAUNDERS, MD;  [facility];  Service: Cardiovascular;  Laterality: N/A;   right knee arthroscopy     SKIN SPLIT GRAFT Right 03/04/2018   Procedure: POSSIBLE SPLIT THICKNESS SKIN GRAFT;  Surgeon: Harden Jerona GAILS, MD;  [facility];  Service: Orthopedics;  Laterality: Right;   SKIN SPLIT GRAFT Right 04/01/2018   Procedure: REPEAT IRRIGATION AND DEBRIDEMENT RIGHT LEG, APPLY SPLIT THICKNESS SKIN GRAFT;  Surgeon: Harden Jerona GAILS, MD;  [facility];  Service: Orthopedics;  Laterality: Right;   TONSILLECTOMY AND ADENOIDECTOMY      MEDICATIONS:  acetaminophen  (TYLENOL ) 500 MG tablet   allopurinol  (ZYLOPRIM ) 100 MG tablet   atorvastatin  (LIPITOR) 40 MG tablet   budesonide -glycopyrrolate -formoterol  (BREZTRI  AEROSPHERE) 160-9-4.8 MCG/ACT AERO inhaler   calcium  carbonate (TUMS - DOSED IN MG ELEMENTAL CALCIUM ) 500 MG chewable tablet   cyanocobalamin  (,VITAMIN B-12,) 1000 MCG/ML injection   dapagliflozin  propanediol (FARXIGA ) 10 MG TABS tablet   diclofenac  Sodium (VOLTAREN ) 1 % GEL   diphenhydrAMINE  (BENADRYL ) 12.5 MG/5ML liquid   ELIQUIS  2.5 MG TABS tablet   famotidine  (PEPCID ) 40 MG tablet   ferrous sulfate  (FEROSUL) 325 (65 FE) MG tablet   folic acid  (FOLVITE ) 400 MCG tablet   gabapentin  (NEURONTIN ) 100 MG capsule   hydrOXYzine   (ATARAX ) 25 MG tablet   liver oil-zinc oxide (DESITIN) 40 % ointment   Menthol , Topical Analgesic, (BIOFREEZE EX)   montelukast  (SINGULAIR ) 10 MG tablet   Multiple Vitamins-Minerals (MULTIVITAMIN WOMEN 50+ PO)   Oxcarbazepine  (TRILEPTAL ) 300 MG tablet   oxyCODONE  (OXY IR/ROXICODONE ) 5 MG immediate release tablet   Potassium Chloride  ER 20 MEQ TBCR   Probiotic Product (PROBIOTIC PO)   spironolactone  (ALDACTONE ) 25 MG tablet   torsemide  (DEMADEX ) 20 MG tablet   triamcinolone  ointment (KENALOG ) 0.1 %   triamcinolone  ointment (KENALOG ) 0.5 %   trimethoprim (TRIMPEX) 100 MG tablet   trolamine salicylate (ASPERCREME) 10 % cream   VENTOLIN  HFA 108 (90 Base) MCG/ACT inhaler   VITAMIN D  PO   Alcohol Swabs (DROPSAFE ALCOHOL PREP) 70 % PADS   azelastine  (ASTELIN ) 0.1 % nasal spray   benzonatate  (TESSALON ) 200 MG capsule   Blood Glucose Calibration (TRUE METRIX LEVEL 1) Low SOLN   Blood Glucose Monitoring Suppl (TRUE METRIX METER) DEVI   Calcium  Carb-Cholecalciferol  (OYSTER SHELL CALCIUM  250+D) 250-3.125 MG-MCG TABS   fluconazole (DIFLUCAN) 150 MG tablet   fluticasone  (FLONASE ) 50 MCG/ACT nasal spray   metroNIDAZOLE (FLAGYL) 500 MG tablet   nebivolol  (BYSTOLIC ) 5 MG tablet   polyethylene glycol (MIRALAX  / GLYCOLAX ) 17 g packet   Spacer/Aero-Holding Chambers DEVI   tirzepatide  (MOUNJARO ) 15 MG/0.5ML Pen   UNABLE TO FIND    Isaiah Ruder, PA-C Surgical Short Stay/Anesthesiology [facility] Phone [phone] [facility] Phone [phone] 02/09/2024 11:27 AM

## 2024-02-09 NOTE — Anesthesia Preprocedure Evaluation (Addendum)
 "                                  Anesthesia Evaluation  Patient identified by MRN, date of birth, ID band Patient awake    Reviewed: Allergy & Precautions, NPO status , Patient's Chart, lab work & pertinent test results, reviewed documented beta blocker date and time   History of Anesthesia Complications Negative for: history of anesthetic complications  Airway Mallampati: III  TM Distance: >3 FB Neck ROM: Full    Dental  (+) Teeth Intact, Dental Advisory Given   Pulmonary asthma , sleep apnea , neg COPD, Patient abstained from smoking.Not current smoker    + decreased breath sounds      Cardiovascular Exercise Tolerance: Poor METShypertension, Pt. on home beta blockers and Pt. on medications + CAD and +CHF  (-) Past MI Normal cardiovascular exam+ dysrhythmias Supra Ventricular Tachycardia  Rhythm:Regular Rate:Normal   1. Left ventricular ejection fraction, by estimation, is 65 to 70%. Left  ventricular ejection fraction by PLAX is 68 %. The left ventricle has  normal function. The left ventricle has no regional wall motion  abnormalities. There is mild left ventricular  hypertrophy. Left ventricular diastolic parameters are consistent with  Grade I diastolic dysfunction (impaired relaxation).   2. Right ventricular systolic function is normal. The right ventricular  size is mildly enlarged. Tricuspid regurgitation signal is inadequate for  assessing PA pressure.   3. Left atrial size was mildly dilated.   4. The mitral valve is abnormal. Trivial mitral valve regurgitation.   5. The aortic valve is tricuspid. Aortic valve regurgitation is not  visualized. Aortic valve sclerosis/calcification is present, without any  evidence of aortic stenosis.   6. The inferior vena cava is normal in size with greater than 50%  respiratory variability, suggesting right atrial pressure of 3 mmHg.     Neuro/Psych  Neuromuscular disease  negative psych ROS   GI/Hepatic Neg  liver ROS, PUD,GERD  Medicated,,  Endo/Other  diabetes, Type 2, Oral Hypoglycemic Agents  Class 4 [id number] held for > 7 days. Patient has some nausea today which she attributes to not being able to drink enough water to take all her morning pills.  Renal/GU Renal InsufficiencyRenal disease     Musculoskeletal negative musculoskeletal ROS (+)    Abdominal  (+) + obese  Peds  Hematology  (+) Blood dyscrasia (Eliquis )   Anesthesia Other Findings Per PAT note: Patient is a 74 year old female scheduled for MR abdomen MRCP on 02/10/2024. She had an left kidney lesion noted on July 2025 CT of undetermined significance.  MRI was ordered by Dr. Carolee. She has PCP Progress Note for follow-up chronic medical conditions, done within past 30 days, 01/14/2024.    History includes never smoker, HTN, HLD, CAD (mild-moderate 03/2006), chronic diastolic CHF, hypertension (mild to moderate by RHC 11/2020), DM2, peripheral neuropathy, asthma, anemia, exertional dyspnea, OSA (intermittent use of BiPAP), asthma, GERD, CKD,  morbid obesity, chronic RLE ulceration (s/p I&D, STSG, wound vac 2020), DVT (BLE age-indeterminate DVT 05/08/2020).   Last HF visit was on 01/03/2024 with Hayes Lander, NP. 03/2006 LHC showed 50-60% LAD stenosis after the D1 and 40% mid LAD after the D2, normal LCX, 30% mid RCA and proximal PDA. Last RHC 03/2020 showed mild to moderate pulmonary hypertension, likely due to OSA/OHS. 12/2020 sleep study showed no significant OSA but nocturnal desaturations to 84%. Later prescribed BiPAP at night and  2L O2 as needed during the day. Echo 04/2022 showed LVEF 65-70%, no RWMA, mild LVH, grade 1 DD, mild RVH, normal RV systolic function, trivial MR. Chronic LE edema. On Eliquis  since BLE in 2022. Meds continued. 9 month follow-up with echo planned.   Follows with nephrology for CKD, slowly progressive and likely due to HTN and DM. Cr ~ 1.7-2.1 since 2024.    Reproductive/Obstetrics                               Anesthesia Physical Anesthesia Plan  ASA: 4  Anesthesia Plan: General   Post-op Pain Management:    Induction: Intravenous and Rapid sequence  PONV Risk Score and Plan: 3 and Treatment may vary due to age or medical condition, Ondansetron  and Dexamethasone   Airway Management Planned: Oral ETT and Video Laryngoscope Planned  Additional Equipment: None  Intra-op Plan:   Post-operative Plan: Extubation in OR  Informed Consent: I have reviewed the patients History and Physical, chart, labs and discussed the procedure including the risks, benefits and alternatives for the proposed anesthesia with the patient or authorized representative who has indicated his/her understanding and acceptance.     Dental advisory given  Plan Discussed with: CRNA  Anesthesia Plan Comments: (Will plan on preop zofran , duoneb, and RSI for intubation.  Discussed risks of anesthesia with patient, including PONV, sore throat, lip/dental/eye damage. Rare risks discussed as well, such as cardiorespiratory and neurological sequelae, and allergic reactions. Discussed the role of CRNA in patient's perioperative care. Patient understands. Patient informed about increased incidence of above perioperative risk due to high BMI. Patient understands.  )         Anesthesia Quick Evaluation  "

## 2024-02-10 ENCOUNTER — Ambulatory Visit (HOSPITAL_COMMUNITY)

## 2024-02-10 ENCOUNTER — Encounter (HOSPITAL_COMMUNITY)
Admission: RE | Disposition: A | Discharge status: Home / Self Care | Payer: Self-pay | Source: Home / Self Care | Attending: Urology

## 2024-02-10 ENCOUNTER — Ambulatory Visit (HOSPITAL_COMMUNITY): Payer: Self-pay | Admitting: Vascular Surgery

## 2024-02-10 ENCOUNTER — Ambulatory Visit (HOSPITAL_COMMUNITY)
Admission: RE | Admit: 2024-02-10 | Discharge: 2024-02-10 | Disposition: A | Discharge status: Home / Self Care | Attending: Urology | Admitting: Urology

## 2024-02-10 DIAGNOSIS — I700 Atherosclerosis of aorta: Secondary | ICD-10-CM | POA: Diagnosis not present

## 2024-02-10 DIAGNOSIS — D4102 Neoplasm of uncertain behavior of left kidney: Secondary | ICD-10-CM | POA: Diagnosis not present

## 2024-02-10 DIAGNOSIS — G4730 Sleep apnea, unspecified: Secondary | ICD-10-CM | POA: Insufficient documentation

## 2024-02-10 DIAGNOSIS — Z87891 Personal history of nicotine dependence: Secondary | ICD-10-CM | POA: Insufficient documentation

## 2024-02-10 DIAGNOSIS — K219 Gastro-esophageal reflux disease without esophagitis: Secondary | ICD-10-CM | POA: Insufficient documentation

## 2024-02-10 DIAGNOSIS — I509 Heart failure, unspecified: Secondary | ICD-10-CM | POA: Insufficient documentation

## 2024-02-10 DIAGNOSIS — Z7984 Long term (current) use of oral hypoglycemic drugs: Secondary | ICD-10-CM | POA: Insufficient documentation

## 2024-02-10 DIAGNOSIS — I130 Hypertensive heart and chronic kidney disease with heart failure and stage 1 through stage 4 chronic kidney disease, or unspecified chronic kidney disease: Secondary | ICD-10-CM | POA: Diagnosis not present

## 2024-02-10 DIAGNOSIS — I2510 Atherosclerotic heart disease of native coronary artery without angina pectoris: Secondary | ICD-10-CM

## 2024-02-10 DIAGNOSIS — E119 Type 2 diabetes mellitus without complications: Secondary | ICD-10-CM | POA: Insufficient documentation

## 2024-02-10 DIAGNOSIS — Z7985 Long-term (current) use of injectable non-insulin antidiabetic drugs: Secondary | ICD-10-CM | POA: Diagnosis not present

## 2024-02-10 DIAGNOSIS — J45909 Unspecified asthma, uncomplicated: Secondary | ICD-10-CM | POA: Insufficient documentation

## 2024-02-10 DIAGNOSIS — E6689 Other obesity not elsewhere classified: Secondary | ICD-10-CM | POA: Diagnosis not present

## 2024-02-10 HISTORY — PX: RADIOLOGY WITH ANESTHESIA: SHX6223

## 2024-02-10 LAB — GLUCOSE, CAPILLARY
Glucose-Capillary: 154 mg/dL — ABNORMAL HIGH (ref 70–99)
Glucose-Capillary: 120 mg/dL — ABNORMAL HIGH (ref 70–99)

## 2024-02-10 MED ORDER — ROCURONIUM BROMIDE 10 MG/ML (PF) SYRINGE
PREFILLED_SYRINGE | INTRAVENOUS | Status: DC | PRN
  Administered 2024-02-10: 30 mg via INTRAVENOUS

## 2024-02-10 MED ORDER — EPHEDRINE SULFATE-NACL 50-0.9 MG/10ML-% IV SOSY
PREFILLED_SYRINGE | INTRAVENOUS | Status: DC | PRN
  Administered 2024-02-10 (×2): 5 mg via INTRAVENOUS

## 2024-02-10 MED ORDER — IPRATROPIUM-ALBUTEROL 0.5-2.5 (3) MG/3ML IN SOLN
3.0000 mL | Freq: Once | RESPIRATORY_TRACT | Status: AC
  Administered 2024-02-10: 3 mL via RESPIRATORY_TRACT
  Filled 2024-02-10: qty 3

## 2024-02-10 MED ORDER — ORAL CARE MOUTH RINSE: 15.0000 mL | Freq: Once | OROMUCOSAL | Status: AC

## 2024-02-10 MED ORDER — CHLORHEXIDINE GLUCONATE 0.12 % MT SOLN: 15.0000 mL | Freq: Once | OROMUCOSAL | Status: AC

## 2024-02-10 MED ORDER — ACETAMINOPHEN 10 MG/ML IV SOLN: 1000.0000 mg | Freq: Once | INTRAVENOUS | Status: DC | PRN

## 2024-02-10 MED ORDER — INSULIN ASPART 100 UNIT/ML IJ SOLN: 0.0000 [IU] | INTRAMUSCULAR | Status: DC | PRN

## 2024-02-10 MED ORDER — MIDAZOLAM HCL (PF) 2 MG/2ML IJ SOLN
INTRAMUSCULAR | Status: DC | PRN
  Administered 2024-02-10: 2 mg via INTRAVENOUS

## 2024-02-10 MED ORDER — SUCCINYLCHOLINE CHLORIDE 200 MG/10ML IV SOSY
PREFILLED_SYRINGE | INTRAVENOUS | Status: DC | PRN
  Administered 2024-02-10: 180 mg via INTRAVENOUS

## 2024-02-10 MED ORDER — PHENYLEPHRINE 80 MCG/ML (10ML) SYRINGE FOR IV PUSH (FOR BLOOD PRESSURE SUPPORT)
PREFILLED_SYRINGE | INTRAVENOUS | Status: DC | PRN
  Administered 2024-02-10 (×5): 160 ug via INTRAVENOUS

## 2024-02-10 MED ORDER — GADOBUTROL 1 MMOL/ML IV SOLN
10.0000 mL | Freq: Once | INTRAVENOUS | Status: AC | PRN
  Administered 2024-02-10: 10 mL via INTRAVENOUS

## 2024-02-10 MED ORDER — LIDOCAINE 2% (20 MG/ML) 5 ML SYRINGE
INTRAMUSCULAR | Status: DC | PRN
  Administered 2024-02-10: 100 mg via INTRAVENOUS

## 2024-02-10 MED ORDER — PROPOFOL 10 MG/ML IV BOLUS
INTRAVENOUS | Status: DC | PRN
  Administered 2024-02-10: 150 mg via INTRAVENOUS

## 2024-02-10 MED ORDER — FENTANYL CITRATE (PF) 250 MCG/5ML IJ SOLN
INTRAMUSCULAR | Status: DC | PRN
  Administered 2024-02-10: 100 ug via INTRAVENOUS

## 2024-02-10 MED ORDER — CHLORHEXIDINE GLUCONATE 0.12 % MT SOLN
OROMUCOSAL | Status: AC
  Administered 2024-02-10: 15 mL via OROMUCOSAL
  Filled 2024-02-10: qty 15

## 2024-02-10 MED ORDER — ONDANSETRON HCL 4 MG/2ML IJ SOLN
4.0000 mg | Freq: Once | INTRAMUSCULAR | Status: AC
  Administered 2024-02-10: 4 mg via INTRAVENOUS
  Filled 2024-02-10: qty 2

## 2024-02-10 MED ORDER — LACTATED RINGERS IV SOLN: INTRAVENOUS | Status: DC

## 2024-02-10 MED ORDER — PHENYLEPHRINE HCL-NACL 20-0.9 MG/250ML-% IV SOLN
INTRAVENOUS | Status: DC | PRN
  Administered 2024-02-10: 60 ug/min via INTRAVENOUS

## 2024-02-10 MED ORDER — DROPERIDOL 2.5 MG/ML IJ SOLN
0.6250 mg | Freq: Once | INTRAMUSCULAR | Status: AC | PRN
  Administered 2024-02-10: 0.625 mg via INTRAVENOUS

## 2024-02-10 MED ORDER — DROPERIDOL 2.5 MG/ML IJ SOLN
INTRAMUSCULAR | Status: AC
  Filled 2024-02-10: qty 2

## 2024-02-10 MED ORDER — SUGAMMADEX SODIUM 200 MG/2ML IV SOLN
INTRAVENOUS | Status: DC | PRN
  Administered 2024-02-10: 200 mg via INTRAVENOUS

## 2024-02-10 NOTE — Anesthesia Procedure Notes (Signed)
 Procedure Name: Intubation Date/Time: 02/10/2024 8:03 AM  Performed by: Jerl Donald LABOR, CRNAPre-anesthesia Checklist: Patient identified, Emergency Drugs available, Suction available and Patient being monitored Patient Re-evaluated:Patient Re-evaluated prior to induction Oxygen  Delivery Method: Circle System Utilized Preoxygenation: Pre-oxygenation with 100% oxygen  Induction Type: IV induction Ventilation: Mask ventilation without difficulty Laryngoscope Size: Glidescope and 3 Grade View: Grade I Tube type: Oral Tube size: 7.0 mm Number of attempts: 1 Airway Equipment and Method: Stylet, Oral airway and Video-laryngoscopy Placement Confirmation: ETT inserted through vocal cords under direct vision, positive ETCO2 and breath sounds checked- equal and bilateral Secured at: 21 cm Tube secured with: Tape Dental Injury: Teeth and Oropharynx as per pre-operative assessment

## 2024-02-10 NOTE — Anesthesia Postprocedure Evaluation (Signed)
"   Anesthesia Post Note  Patient: April [name]  Procedure(s) Performed: MRI WITH ANESTHESIA (Left)     Patient location during evaluation: PACU Anesthesia Type: General Level of consciousness: awake and alert Pain management: pain level controlled Vital Signs Assessment: post-procedure vital signs reviewed and stable Respiratory status: spontaneous breathing, nonlabored ventilation, respiratory function stable and patient connected to nasal cannula oxygen  Cardiovascular status: blood pressure returned to baseline and stable Postop Assessment: no apparent nausea or vomiting Anesthetic complications: no   No notable events documented.  Last Vitals:  Vitals:   02/10/24 0930 02/10/24 0945  BP: 115/64 113/63  Pulse: 78 77  Resp: 17 13  Temp:    SpO2: 96% 90%    Last Pain:  Vitals:   02/10/24 0911  TempSrc:   PainSc: Asleep                 Rome Ade      "

## 2024-02-10 NOTE — Transfer of Care (Signed)
 Immediate Anesthesia Transfer of Care Note  Patient: April [name]  Procedure(s) Performed: MRI WITH ANESTHESIA (Left)  Patient Location: PACU  Anesthesia Type:General  Level of Consciousness: awake, oriented, and drowsy  Airway & Oxygen  Therapy: Patient Spontanous Breathing  Post-op Assessment: Report given to RN, Post -op Vital signs reviewed and stable, and Patient moving all extremities  Post vital signs: Reviewed and stable  Last Vitals:  Vitals Value Taken Time  BP 112/69 02/10/24 10:15  Temp    Pulse 72 02/10/24 10:24  Resp 14 02/10/24 10:24  SpO2 91 % 02/10/24 10:24  Vitals shown include unfiled device data.  Last Pain:  Vitals:   02/10/24 0911  TempSrc:   PainSc: Asleep         Complications: No notable events documented.

## 2024-02-11 ENCOUNTER — Encounter (HOSPITAL_COMMUNITY): Payer: Self-pay | Admitting: Radiology

## 2024-02-15 ENCOUNTER — Telehealth: Payer: Self-pay

## 2024-02-15 NOTE — Telephone Encounter (Signed)
 Copied from CRM #[id number]. Topic: General - Other >> Feb 15, 2024  9:33 AM Tinnie C wrote: Reason for CRM: Pt says she spoke with a nurse named carla and was told to call back to let us  know whether she had or did nothave oxcarbazepine . She says she found the bottle and does have this medication.

## 2024-02-18 ENCOUNTER — Ambulatory Visit: Admitting: Physician Assistant

## 2024-02-18 ENCOUNTER — Other Ambulatory Visit

## 2024-02-24 ENCOUNTER — Telehealth: Payer: Self-pay

## 2024-02-24 NOTE — Telephone Encounter (Signed)
 Copied from CRM [id number]. Topic: Clinical - Medical Advice >> Feb 24, 2024  3:57 PM Terri G wrote: Reason for CRM: Patient is wanting to know when the last time she had her RSV shot

## 2024-02-24 NOTE — Telephone Encounter (Signed)
 Spoke with patient today.

## 2024-03-03 ENCOUNTER — Ambulatory Visit: Admitting: Podiatry

## 2024-03-03 ENCOUNTER — Ambulatory Visit

## 2024-03-09 ENCOUNTER — Ambulatory Visit: Admitting: Pulmonary Disease

## 2024-03-17 ENCOUNTER — Ambulatory Visit

## 2024-03-24 ENCOUNTER — Inpatient Hospital Stay

## 2024-03-24 ENCOUNTER — Inpatient Hospital Stay: Admitting: Physician Assistant

## 2024-04-07 ENCOUNTER — Ambulatory Visit: Admitting: Podiatry

## 2024-04-24 ENCOUNTER — Ambulatory Visit: Admitting: Primary Care

## 2024-06-16 ENCOUNTER — Ambulatory Visit

## 2024-07-14 ENCOUNTER — Ambulatory Visit: Admitting: Internal Medicine
